# Patient Record
Sex: Female | Born: 1975 | Hispanic: No | Smoking: Former smoker
Health system: Southern US, Community
[De-identification: ages and names within clinical notes are randomized; demographics above are authoritative.]

## PROBLEM LIST (undated history)

## (undated) DIAGNOSIS — E785 Hyperlipidemia, unspecified: Secondary | ICD-10-CM

## (undated) DIAGNOSIS — R0689 Other abnormalities of breathing: Secondary | ICD-10-CM

## (undated) DIAGNOSIS — Z9989 Dependence on other enabling machines and devices: Secondary | ICD-10-CM

## (undated) DIAGNOSIS — R778 Other specified abnormalities of plasma proteins: Secondary | ICD-10-CM

## (undated) DIAGNOSIS — I471 Supraventricular tachycardia, unspecified: Secondary | ICD-10-CM

## (undated) DIAGNOSIS — F32A Depression, unspecified: Secondary | ICD-10-CM

## (undated) DIAGNOSIS — K602 Anal fissure, unspecified: Secondary | ICD-10-CM

## (undated) DIAGNOSIS — G629 Polyneuropathy, unspecified: Secondary | ICD-10-CM

## (undated) DIAGNOSIS — I10 Essential (primary) hypertension: Secondary | ICD-10-CM

## (undated) DIAGNOSIS — J189 Pneumonia, unspecified organism: Secondary | ICD-10-CM

## (undated) DIAGNOSIS — D649 Anemia, unspecified: Secondary | ICD-10-CM

## (undated) DIAGNOSIS — M940 Chondrocostal junction syndrome [Tietze]: Secondary | ICD-10-CM

## (undated) DIAGNOSIS — L02214 Cutaneous abscess of groin: Secondary | ICD-10-CM

## (undated) DIAGNOSIS — L0390 Cellulitis, unspecified: Secondary | ICD-10-CM

## (undated) DIAGNOSIS — R7989 Other specified abnormal findings of blood chemistry: Secondary | ICD-10-CM

## (undated) DIAGNOSIS — G4733 Obstructive sleep apnea (adult) (pediatric): Secondary | ICD-10-CM

## (undated) DIAGNOSIS — M86172 Other acute osteomyelitis, left ankle and foot: Secondary | ICD-10-CM

## (undated) DIAGNOSIS — F3181 Bipolar II disorder: Secondary | ICD-10-CM

## (undated) DIAGNOSIS — J449 Chronic obstructive pulmonary disease, unspecified: Secondary | ICD-10-CM

## (undated) DIAGNOSIS — G8929 Other chronic pain: Secondary | ICD-10-CM

## (undated) DIAGNOSIS — G5601 Carpal tunnel syndrome, right upper limb: Secondary | ICD-10-CM

## (undated) DIAGNOSIS — R51 Headache: Secondary | ICD-10-CM

## (undated) DIAGNOSIS — E1165 Type 2 diabetes mellitus with hyperglycemia: Secondary | ICD-10-CM

## (undated) DIAGNOSIS — R42 Dizziness and giddiness: Secondary | ICD-10-CM

## (undated) DIAGNOSIS — R351 Nocturia: Secondary | ICD-10-CM

## (undated) DIAGNOSIS — I8390 Asymptomatic varicose veins of unspecified lower extremity: Secondary | ICD-10-CM

## (undated) DIAGNOSIS — F329 Major depressive disorder, single episode, unspecified: Secondary | ICD-10-CM

## (undated) DIAGNOSIS — F1410 Cocaine abuse, uncomplicated: Secondary | ICD-10-CM

## (undated) DIAGNOSIS — R55 Syncope and collapse: Secondary | ICD-10-CM

## (undated) DIAGNOSIS — R350 Frequency of micturition: Secondary | ICD-10-CM

## (undated) DIAGNOSIS — M62838 Other muscle spasm: Secondary | ICD-10-CM

## (undated) DIAGNOSIS — G2581 Restless legs syndrome: Secondary | ICD-10-CM

## (undated) DIAGNOSIS — Z765 Malingerer [conscious simulation]: Secondary | ICD-10-CM

## (undated) DIAGNOSIS — K219 Gastro-esophageal reflux disease without esophagitis: Secondary | ICD-10-CM

## (undated) HISTORY — DX: Anemia, unspecified: D64.9

## (undated) HISTORY — DX: Asymptomatic varicose veins of unspecified lower extremity: I83.90

## (undated) HISTORY — PX: FOOT AMPUTATION: SHX951

## (undated) HISTORY — DX: Chronic obstructive pulmonary disease, unspecified: J44.9

## (undated) HISTORY — PX: CARPAL TUNNEL RELEASE: SHX101

## (undated) HISTORY — DX: Morbid (severe) obesity due to excess calories: E66.01

## (undated) HISTORY — PX: REPAIR KNEE LIGAMENT: SUR1188

## (undated) HISTORY — PX: OTHER SURGICAL HISTORY: SHX169

---
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
## 1998-12-06 DIAGNOSIS — E1165 Type 2 diabetes mellitus with hyperglycemia: Secondary | ICD-10-CM

## 1998-12-06 DIAGNOSIS — IMO0002 Reserved for concepts with insufficient information to code with codable children: Secondary | ICD-10-CM

## 1998-12-06 HISTORY — DX: Type 2 diabetes mellitus with hyperglycemia: E11.65

## 1998-12-06 HISTORY — DX: Reserved for concepts with insufficient information to code with codable children: IMO0002

## 1999-10-25 ENCOUNTER — Emergency Department (HOSPITAL_COMMUNITY): Admission: EM | Admit: 1999-10-25 | Discharge: 1999-10-25 | Payer: Self-pay | Admitting: Emergency Medicine

## 2000-02-25 ENCOUNTER — Emergency Department (HOSPITAL_COMMUNITY): Admission: EM | Admit: 2000-02-25 | Discharge: 2000-02-25 | Payer: Self-pay | Admitting: Emergency Medicine

## 2000-05-08 ENCOUNTER — Emergency Department (HOSPITAL_COMMUNITY): Admission: EM | Admit: 2000-05-08 | Discharge: 2000-05-08 | Payer: Self-pay | Admitting: Emergency Medicine

## 2000-05-08 ENCOUNTER — Encounter: Payer: Self-pay | Admitting: Emergency Medicine

## 2000-05-23 ENCOUNTER — Emergency Department (HOSPITAL_COMMUNITY): Admission: EM | Admit: 2000-05-23 | Discharge: 2000-05-23 | Payer: Self-pay | Admitting: Emergency Medicine

## 2000-07-06 ENCOUNTER — Emergency Department (HOSPITAL_COMMUNITY): Admission: EM | Admit: 2000-07-06 | Discharge: 2000-07-06 | Payer: Self-pay | Admitting: Emergency Medicine

## 2000-07-06 ENCOUNTER — Encounter: Payer: Self-pay | Admitting: Emergency Medicine

## 2000-07-10 ENCOUNTER — Encounter: Payer: Self-pay | Admitting: Emergency Medicine

## 2000-07-10 ENCOUNTER — Emergency Department (HOSPITAL_COMMUNITY): Admission: EM | Admit: 2000-07-10 | Discharge: 2000-07-10 | Payer: Self-pay | Admitting: Emergency Medicine

## 2000-09-15 ENCOUNTER — Emergency Department (HOSPITAL_COMMUNITY): Admission: EM | Admit: 2000-09-15 | Discharge: 2000-09-15 | Payer: Self-pay | Admitting: Emergency Medicine

## 2000-11-06 ENCOUNTER — Emergency Department (HOSPITAL_COMMUNITY): Admission: EM | Admit: 2000-11-06 | Discharge: 2000-11-06 | Payer: Self-pay

## 2000-11-10 ENCOUNTER — Emergency Department (HOSPITAL_COMMUNITY): Admission: EM | Admit: 2000-11-10 | Discharge: 2000-11-10 | Payer: Self-pay | Admitting: Emergency Medicine

## 2000-11-13 ENCOUNTER — Emergency Department (HOSPITAL_COMMUNITY): Admission: EM | Admit: 2000-11-13 | Discharge: 2000-11-14 | Payer: Self-pay | Admitting: Emergency Medicine

## 2000-11-14 ENCOUNTER — Encounter: Payer: Self-pay | Admitting: Emergency Medicine

## 2000-12-03 ENCOUNTER — Emergency Department (HOSPITAL_COMMUNITY): Admission: EM | Admit: 2000-12-03 | Discharge: 2000-12-04 | Payer: Self-pay

## 2000-12-08 ENCOUNTER — Encounter: Payer: Self-pay | Admitting: Emergency Medicine

## 2000-12-08 ENCOUNTER — Emergency Department (HOSPITAL_COMMUNITY): Admission: EM | Admit: 2000-12-08 | Discharge: 2000-12-08 | Payer: Self-pay | Admitting: Emergency Medicine

## 2000-12-29 ENCOUNTER — Emergency Department (HOSPITAL_COMMUNITY): Admission: EM | Admit: 2000-12-29 | Discharge: 2000-12-29 | Payer: Self-pay | Admitting: Emergency Medicine

## 2001-01-20 ENCOUNTER — Emergency Department (HOSPITAL_COMMUNITY): Admission: EM | Admit: 2001-01-20 | Discharge: 2001-01-20 | Payer: Self-pay | Admitting: Emergency Medicine

## 2001-02-03 ENCOUNTER — Encounter: Payer: Self-pay | Admitting: *Deleted

## 2001-02-03 ENCOUNTER — Inpatient Hospital Stay (HOSPITAL_COMMUNITY): Admission: EM | Admit: 2001-02-03 | Discharge: 2001-02-06 | Payer: Self-pay | Admitting: *Deleted

## 2001-03-01 ENCOUNTER — Encounter: Admission: RE | Admit: 2001-03-01 | Discharge: 2001-03-30 | Payer: Self-pay | Admitting: Orthopedic Surgery

## 2001-05-07 ENCOUNTER — Emergency Department (HOSPITAL_COMMUNITY): Admission: EM | Admit: 2001-05-07 | Discharge: 2001-05-07 | Payer: Self-pay | Admitting: Emergency Medicine

## 2001-05-10 ENCOUNTER — Emergency Department (HOSPITAL_COMMUNITY): Admission: EM | Admit: 2001-05-10 | Discharge: 2001-05-10 | Payer: Self-pay | Admitting: Emergency Medicine

## 2001-05-10 ENCOUNTER — Encounter: Payer: Self-pay | Admitting: Emergency Medicine

## 2001-06-10 ENCOUNTER — Emergency Department (HOSPITAL_COMMUNITY): Admission: EM | Admit: 2001-06-10 | Discharge: 2001-06-10 | Payer: Self-pay | Admitting: Emergency Medicine

## 2001-06-10 ENCOUNTER — Encounter: Payer: Self-pay | Admitting: Emergency Medicine

## 2001-07-02 ENCOUNTER — Emergency Department (HOSPITAL_COMMUNITY): Admission: EM | Admit: 2001-07-02 | Discharge: 2001-07-03 | Payer: Self-pay | Admitting: Emergency Medicine

## 2001-07-02 ENCOUNTER — Encounter: Payer: Self-pay | Admitting: Emergency Medicine

## 2001-09-05 ENCOUNTER — Emergency Department (HOSPITAL_COMMUNITY): Admission: EM | Admit: 2001-09-05 | Discharge: 2001-09-05 | Payer: Self-pay | Admitting: Emergency Medicine

## 2001-09-05 ENCOUNTER — Encounter: Payer: Self-pay | Admitting: Emergency Medicine

## 2001-10-11 ENCOUNTER — Emergency Department (HOSPITAL_COMMUNITY): Admission: EM | Admit: 2001-10-11 | Discharge: 2001-10-11 | Payer: Self-pay | Admitting: Emergency Medicine

## 2001-10-19 ENCOUNTER — Encounter: Payer: Self-pay | Admitting: Orthopedic Surgery

## 2001-10-19 ENCOUNTER — Ambulatory Visit (HOSPITAL_COMMUNITY): Admission: RE | Admit: 2001-10-19 | Discharge: 2001-10-19 | Payer: Self-pay | Admitting: Orthopedic Surgery

## 2001-11-05 ENCOUNTER — Emergency Department (HOSPITAL_COMMUNITY): Admission: EM | Admit: 2001-11-05 | Discharge: 2001-11-05 | Payer: Self-pay | Admitting: Emergency Medicine

## 2001-11-07 ENCOUNTER — Encounter: Admission: RE | Admit: 2001-11-07 | Discharge: 2001-11-21 | Payer: Self-pay | Admitting: Orthopedic Surgery

## 2001-12-02 ENCOUNTER — Emergency Department (HOSPITAL_COMMUNITY): Admission: EM | Admit: 2001-12-02 | Discharge: 2001-12-02 | Payer: Self-pay | Admitting: Emergency Medicine

## 2001-12-07 ENCOUNTER — Emergency Department (HOSPITAL_COMMUNITY): Admission: EM | Admit: 2001-12-07 | Discharge: 2001-12-07 | Payer: Self-pay | Admitting: Emergency Medicine

## 2001-12-07 ENCOUNTER — Encounter: Payer: Self-pay | Admitting: Emergency Medicine

## 2002-01-16 ENCOUNTER — Encounter: Payer: Self-pay | Admitting: Emergency Medicine

## 2002-01-16 ENCOUNTER — Emergency Department (HOSPITAL_COMMUNITY): Admission: EM | Admit: 2002-01-16 | Discharge: 2002-01-16 | Payer: Self-pay | Admitting: Emergency Medicine

## 2002-04-06 ENCOUNTER — Emergency Department (HOSPITAL_COMMUNITY): Admission: EM | Admit: 2002-04-06 | Discharge: 2002-04-06 | Payer: Self-pay | Admitting: Emergency Medicine

## 2002-04-06 ENCOUNTER — Encounter: Payer: Self-pay | Admitting: Emergency Medicine

## 2002-04-22 ENCOUNTER — Emergency Department (HOSPITAL_COMMUNITY): Admission: EM | Admit: 2002-04-22 | Discharge: 2002-04-22 | Payer: Self-pay

## 2002-05-07 ENCOUNTER — Encounter: Payer: Self-pay | Admitting: Geriatric Medicine

## 2002-05-07 ENCOUNTER — Inpatient Hospital Stay (HOSPITAL_COMMUNITY): Admission: EM | Admit: 2002-05-07 | Discharge: 2002-05-08 | Payer: Self-pay | Admitting: Emergency Medicine

## 2002-05-18 ENCOUNTER — Emergency Department (HOSPITAL_COMMUNITY): Admission: EM | Admit: 2002-05-18 | Discharge: 2002-05-19 | Payer: Self-pay | Admitting: Emergency Medicine

## 2002-05-19 ENCOUNTER — Encounter: Payer: Self-pay | Admitting: Emergency Medicine

## 2002-05-23 ENCOUNTER — Encounter: Payer: Self-pay | Admitting: Emergency Medicine

## 2002-05-23 ENCOUNTER — Emergency Department (HOSPITAL_COMMUNITY): Admission: EM | Admit: 2002-05-23 | Discharge: 2002-05-23 | Payer: Self-pay | Admitting: Emergency Medicine

## 2002-06-20 ENCOUNTER — Encounter: Payer: Self-pay | Admitting: Emergency Medicine

## 2002-06-20 ENCOUNTER — Emergency Department (HOSPITAL_COMMUNITY): Admission: EM | Admit: 2002-06-20 | Discharge: 2002-06-20 | Payer: Self-pay | Admitting: *Deleted

## 2003-02-18 ENCOUNTER — Encounter: Payer: Self-pay | Admitting: Emergency Medicine

## 2003-02-18 ENCOUNTER — Inpatient Hospital Stay (HOSPITAL_COMMUNITY): Admission: EM | Admit: 2003-02-18 | Discharge: 2003-02-22 | Payer: Self-pay | Admitting: Emergency Medicine

## 2003-02-20 ENCOUNTER — Encounter: Payer: Self-pay | Admitting: Cardiovascular Disease

## 2003-05-25 ENCOUNTER — Emergency Department (HOSPITAL_COMMUNITY): Admission: EM | Admit: 2003-05-25 | Discharge: 2003-05-25 | Payer: Self-pay

## 2003-07-14 ENCOUNTER — Encounter: Payer: Self-pay | Admitting: Emergency Medicine

## 2003-07-14 ENCOUNTER — Emergency Department (HOSPITAL_COMMUNITY): Admission: AD | Admit: 2003-07-14 | Discharge: 2003-07-15 | Payer: Self-pay | Admitting: Emergency Medicine

## 2003-07-21 ENCOUNTER — Emergency Department (HOSPITAL_COMMUNITY): Admission: EM | Admit: 2003-07-21 | Discharge: 2003-07-21 | Payer: Self-pay | Admitting: Emergency Medicine

## 2003-07-21 ENCOUNTER — Encounter: Payer: Self-pay | Admitting: Emergency Medicine

## 2003-12-26 ENCOUNTER — Emergency Department (HOSPITAL_COMMUNITY): Admission: EM | Admit: 2003-12-26 | Discharge: 2003-12-26 | Payer: Self-pay | Admitting: Emergency Medicine

## 2004-01-09 ENCOUNTER — Emergency Department (HOSPITAL_COMMUNITY): Admission: EM | Admit: 2004-01-09 | Discharge: 2004-01-09 | Payer: Self-pay | Admitting: Emergency Medicine

## 2004-01-16 ENCOUNTER — Emergency Department (HOSPITAL_COMMUNITY): Admission: EM | Admit: 2004-01-16 | Discharge: 2004-01-17 | Payer: Self-pay | Admitting: Emergency Medicine

## 2004-01-27 ENCOUNTER — Emergency Department (HOSPITAL_COMMUNITY): Admission: EM | Admit: 2004-01-27 | Discharge: 2004-01-27 | Payer: Self-pay | Admitting: Emergency Medicine

## 2004-01-28 ENCOUNTER — Encounter: Admission: RE | Admit: 2004-01-28 | Discharge: 2004-04-27 | Payer: Self-pay | Admitting: Internal Medicine

## 2004-01-31 ENCOUNTER — Ambulatory Visit (HOSPITAL_BASED_OUTPATIENT_CLINIC_OR_DEPARTMENT_OTHER): Admission: RE | Admit: 2004-01-31 | Discharge: 2004-01-31 | Payer: Self-pay | Admitting: Critical Care Medicine

## 2004-02-07 ENCOUNTER — Encounter: Admission: RE | Admit: 2004-02-07 | Discharge: 2004-02-07 | Payer: Self-pay | Admitting: Family Medicine

## 2004-02-27 ENCOUNTER — Encounter: Admission: RE | Admit: 2004-02-27 | Discharge: 2004-02-27 | Payer: Self-pay | Admitting: Family Medicine

## 2004-03-10 ENCOUNTER — Encounter: Admission: RE | Admit: 2004-03-10 | Discharge: 2004-03-10 | Payer: Self-pay | Admitting: Obstetrics and Gynecology

## 2004-03-13 ENCOUNTER — Emergency Department (HOSPITAL_COMMUNITY): Admission: EM | Admit: 2004-03-13 | Discharge: 2004-03-13 | Payer: Self-pay | Admitting: Emergency Medicine

## 2004-03-17 ENCOUNTER — Emergency Department (HOSPITAL_COMMUNITY): Admission: EM | Admit: 2004-03-17 | Discharge: 2004-03-17 | Payer: Self-pay | Admitting: Emergency Medicine

## 2004-03-26 ENCOUNTER — Ambulatory Visit (HOSPITAL_COMMUNITY): Admission: RE | Admit: 2004-03-26 | Discharge: 2004-03-26 | Payer: Self-pay | Admitting: Nurse Practitioner

## 2004-05-04 ENCOUNTER — Emergency Department (HOSPITAL_COMMUNITY): Admission: EM | Admit: 2004-05-04 | Discharge: 2004-05-04 | Payer: Self-pay | Admitting: Emergency Medicine

## 2004-05-04 ENCOUNTER — Ambulatory Visit (HOSPITAL_COMMUNITY): Admission: RE | Admit: 2004-05-04 | Discharge: 2004-05-04 | Payer: Self-pay | Admitting: Emergency Medicine

## 2004-05-05 ENCOUNTER — Ambulatory Visit (HOSPITAL_COMMUNITY): Admission: RE | Admit: 2004-05-05 | Discharge: 2004-05-05 | Payer: Self-pay | Admitting: Emergency Medicine

## 2004-05-06 ENCOUNTER — Ambulatory Visit (HOSPITAL_BASED_OUTPATIENT_CLINIC_OR_DEPARTMENT_OTHER): Admission: RE | Admit: 2004-05-06 | Discharge: 2004-05-06 | Payer: Self-pay | Admitting: *Deleted

## 2004-05-16 ENCOUNTER — Emergency Department (HOSPITAL_COMMUNITY): Admission: EM | Admit: 2004-05-16 | Discharge: 2004-05-17 | Payer: Self-pay | Admitting: Emergency Medicine

## 2004-05-21 ENCOUNTER — Emergency Department (HOSPITAL_COMMUNITY): Admission: EM | Admit: 2004-05-21 | Discharge: 2004-05-21 | Payer: Self-pay | Admitting: Family Medicine

## 2004-05-30 ENCOUNTER — Emergency Department (HOSPITAL_COMMUNITY): Admission: EM | Admit: 2004-05-30 | Discharge: 2004-05-30 | Payer: Self-pay | Admitting: Emergency Medicine

## 2004-07-20 ENCOUNTER — Emergency Department (HOSPITAL_COMMUNITY): Admission: EM | Admit: 2004-07-20 | Discharge: 2004-07-20 | Payer: Self-pay | Admitting: Family Medicine

## 2004-09-04 ENCOUNTER — Encounter: Admission: RE | Admit: 2004-09-04 | Discharge: 2004-09-04 | Payer: Self-pay | Admitting: Orthopaedic Surgery

## 2004-09-07 ENCOUNTER — Emergency Department (HOSPITAL_COMMUNITY): Admission: EM | Admit: 2004-09-07 | Discharge: 2004-09-07 | Payer: Self-pay | Admitting: Emergency Medicine

## 2004-09-22 ENCOUNTER — Encounter: Admission: RE | Admit: 2004-09-22 | Discharge: 2004-10-28 | Payer: Self-pay | Admitting: Orthopaedic Surgery

## 2004-09-29 ENCOUNTER — Emergency Department (HOSPITAL_COMMUNITY): Admission: EM | Admit: 2004-09-29 | Discharge: 2004-09-29 | Payer: Self-pay | Admitting: Emergency Medicine

## 2004-11-22 ENCOUNTER — Emergency Department (HOSPITAL_COMMUNITY): Admission: EM | Admit: 2004-11-22 | Discharge: 2004-11-23 | Payer: Self-pay | Admitting: Emergency Medicine

## 2004-12-20 ENCOUNTER — Emergency Department (HOSPITAL_COMMUNITY): Admission: EM | Admit: 2004-12-20 | Discharge: 2004-12-20 | Payer: Self-pay | Admitting: Emergency Medicine

## 2004-12-22 ENCOUNTER — Inpatient Hospital Stay (HOSPITAL_COMMUNITY): Admission: EM | Admit: 2004-12-22 | Discharge: 2004-12-24 | Payer: Self-pay | Admitting: Psychiatry

## 2004-12-22 ENCOUNTER — Encounter: Payer: Self-pay | Admitting: Emergency Medicine

## 2004-12-22 ENCOUNTER — Ambulatory Visit: Payer: Self-pay | Admitting: Psychiatry

## 2005-02-08 ENCOUNTER — Emergency Department (HOSPITAL_COMMUNITY): Admission: EM | Admit: 2005-02-08 | Discharge: 2005-02-08 | Payer: Self-pay | Admitting: Emergency Medicine

## 2005-02-17 IMAGING — CR DG FOOT COMPLETE 3+V*L*
3 series · 3 of 3 positions shown · non-contrast
Comparison: none

CLINICAL DATA: 27 year-old female.   Fall, pain, previous history of fifth metatarsal base fracture.   Current pain is along the fifth metatarsal head.
 LEFT FOOT, THREE VIEWS

[view not recorded (1 of 3)]
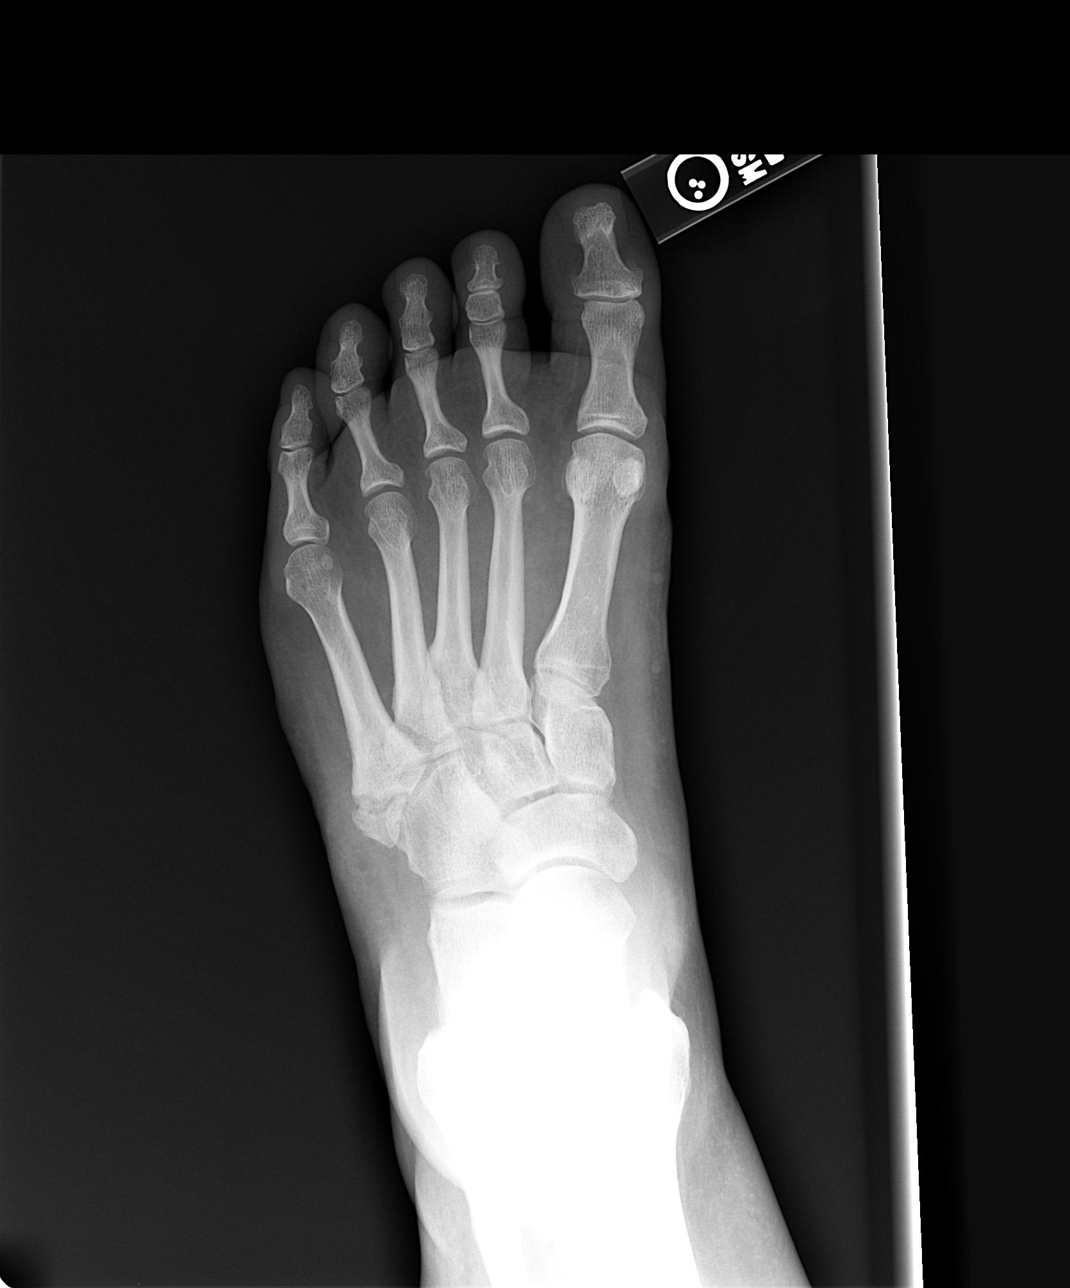

[view not recorded (2 of 3)]
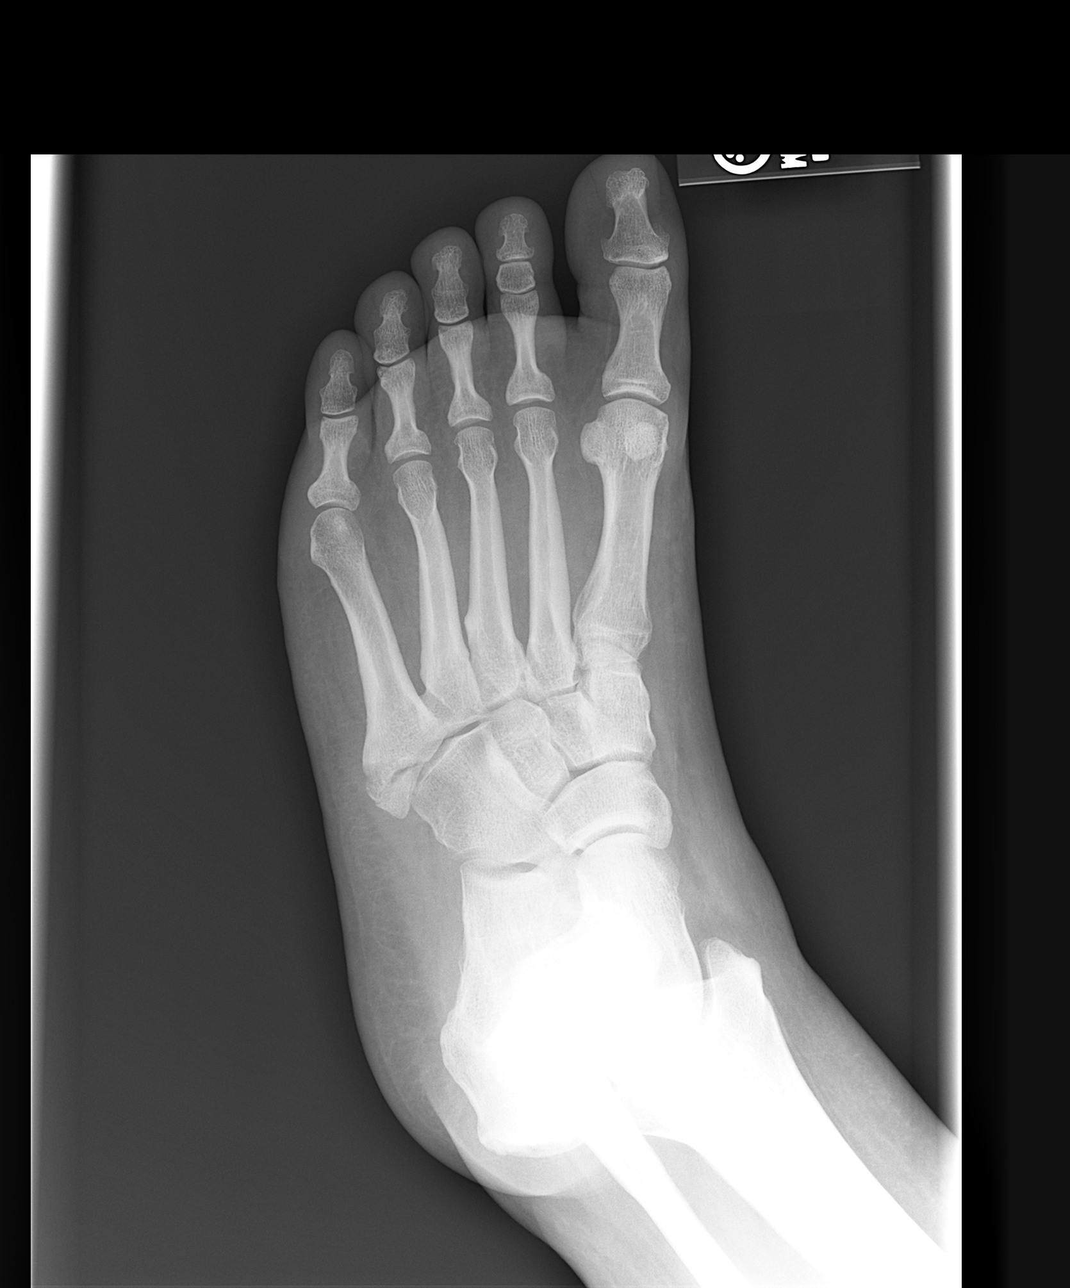

[view not recorded (3 of 3)]
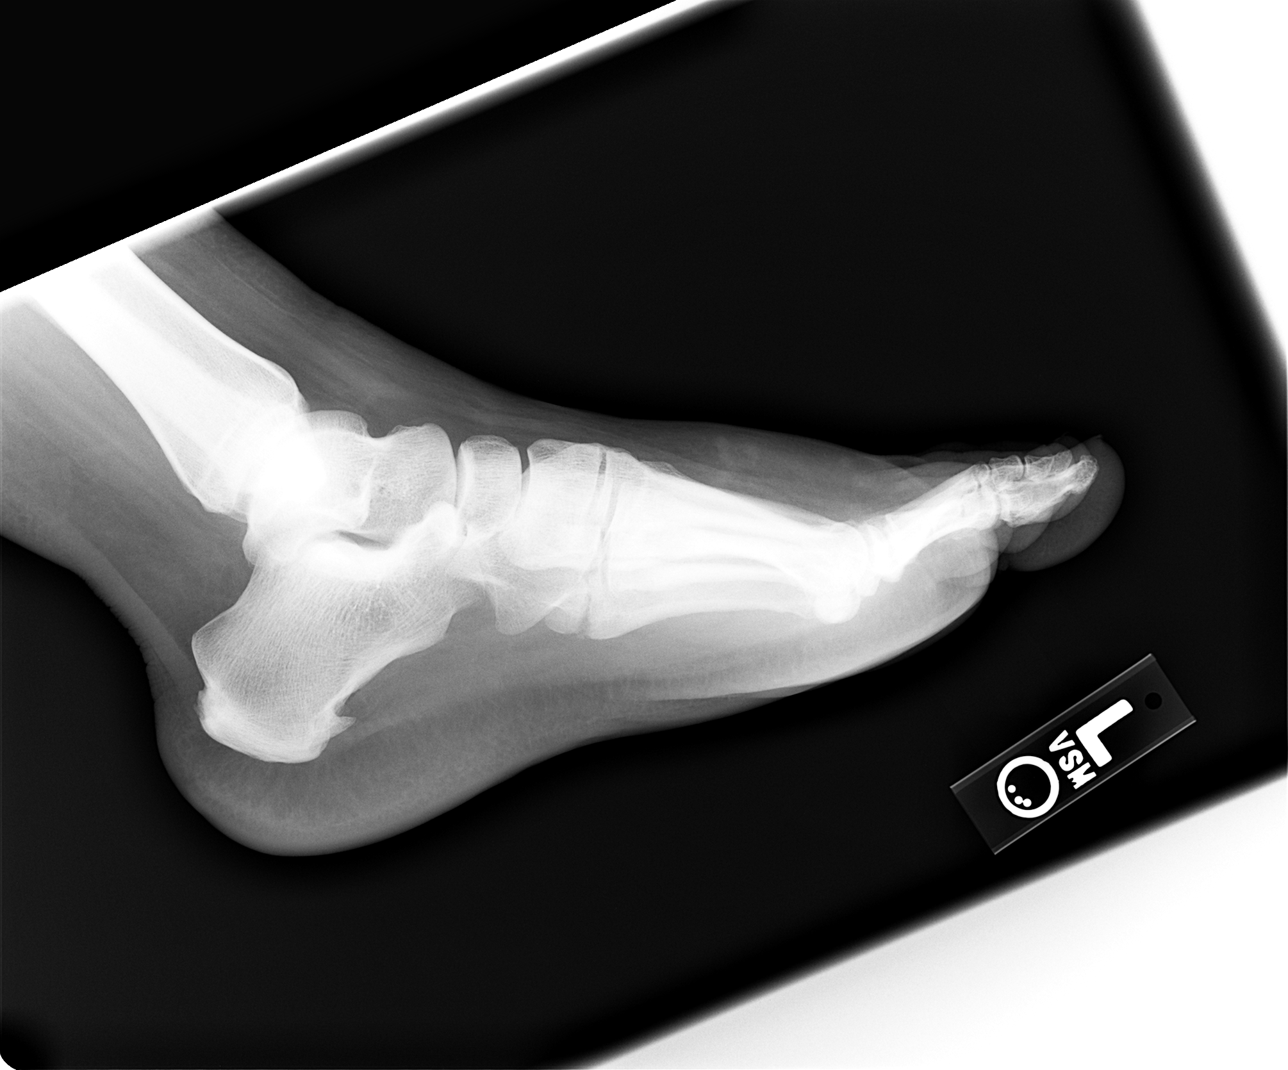

[3 of 3 positions shown; findings below may reference images not displayed]

FINDINGS: There is bony irregularity with evidence of sclerosis about the left fifth metatarsal base consistent with a partially healed nonunion fracture.   This does not appear to be acute.   Specifically, the left fifth metatarsal head is intact.  Plantar calcaneal spurring is evident.  
 IMPRESSION 
 Partially healed but ununited remote fracture of the left fifth metatarsal base.  No definite acute fracture.

## 2005-02-23 ENCOUNTER — Emergency Department (HOSPITAL_COMMUNITY): Admission: EM | Admit: 2005-02-23 | Discharge: 2005-02-23 | Payer: Self-pay | Admitting: Emergency Medicine

## 2005-02-24 IMAGING — CT CT EXTREM LOW BILAT W/ CM
2 of 5 series · 7 of 14 positions shown, 8 images · IV contrast (omnipaque)
Comparison: Report from prior chest CT from 05/07/02 and prior two view chest radiograph from 12/26/03.

CLINICAL DATA: Chest pain.
 CT OF THE CHEST WITH CONTRAST ? 01/16/04
TECHNIQUE: Contiguous axial CT images were obtained through the lungs following intravenous administration of 200 cc Omnipaque 300 IV contrast.
TECHNIQUE: In the delayed venous phase following contrast administration, noncontiguous axial CT images were obtained from the knees to the iliac crest.

[Series 2: pe · axial · 0.64mm/px · z∈[-246,-131]mm · 2 of 138 slices shown, 3 images]
[im 46/138  soft-tissue]
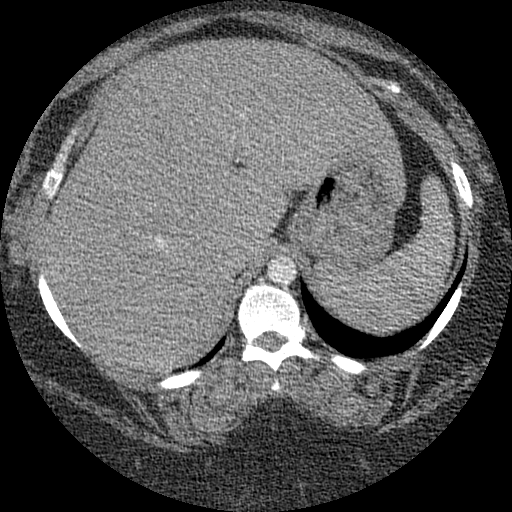
[im 46/138  bone]
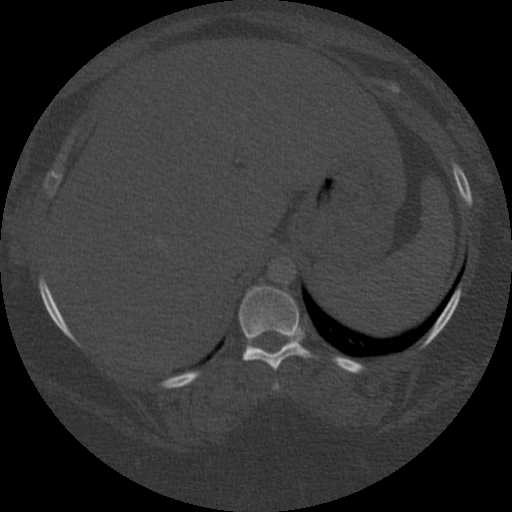
[im 92/138  bone]
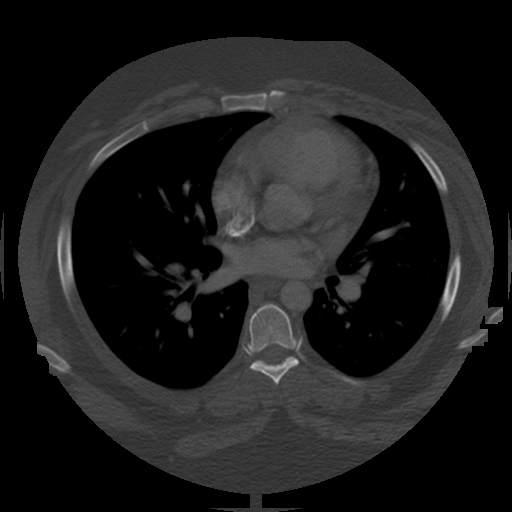

[Series 3: recon 2: pe · axial · 0.64mm/px · z∈[-221,-58]mm · 5 of 197 slices shown]
[im 33/197  bone]
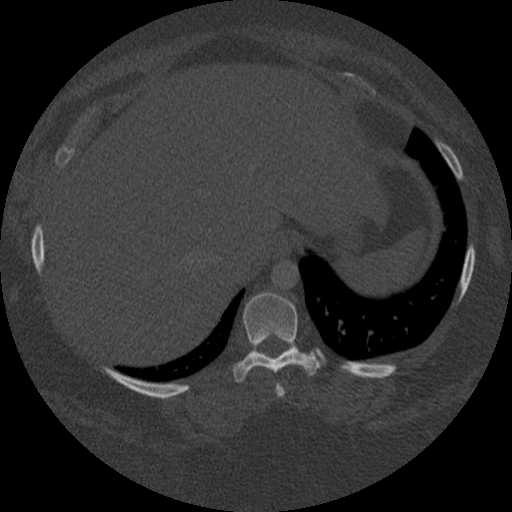
[im 66/197  bone]
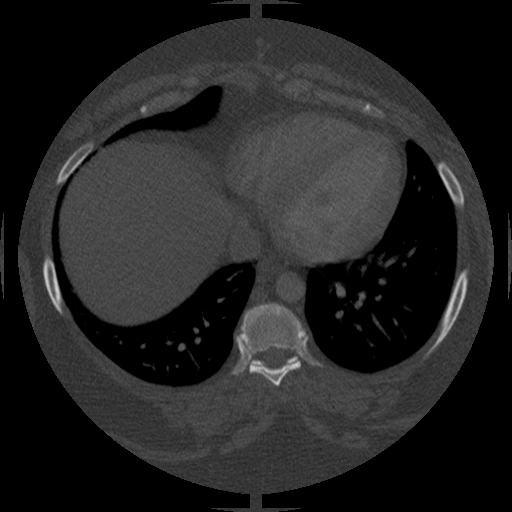
[im 99/197  bone]
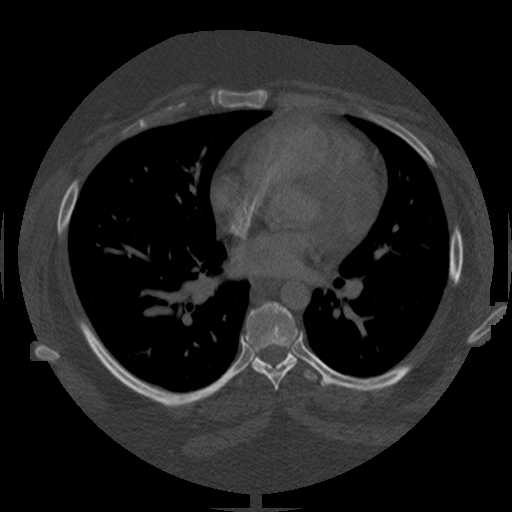
[im 131/197  bone]
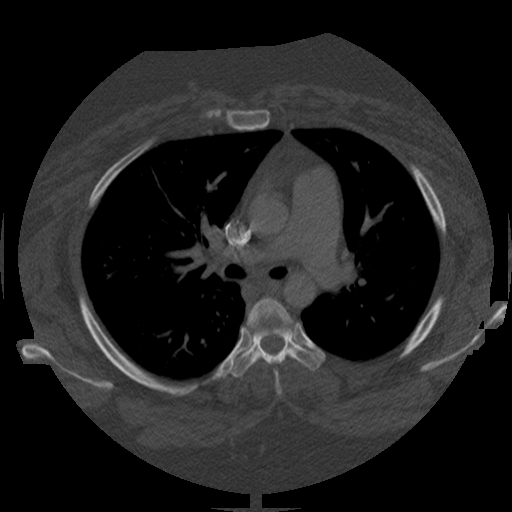
[im 164/197  bone]
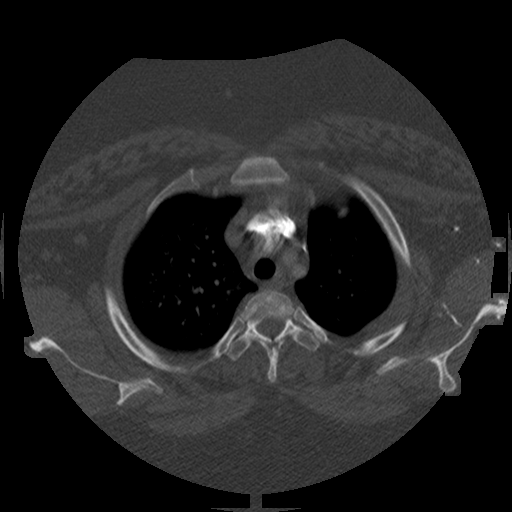

[7 of 14 positions shown; findings below may reference images not displayed]

FINDINGS: Exam is mildly limited due to the patient?s body habitus.  
 No filling defect is identified in the pulmonary arterial tree to suggest pulmonary embolus.  Thoracic aorta and branch vessels appear unremarkable.  There is no evidence of hilar adenopathy.  A prevascular lymph node measures 10.0 mm in short axis dimension on image 74, which is borderline enlarged.
IMPRESSION: Borderline size of single prevascular lymph node.  Otherwise normal CT appearance of the chest.
 LOWER EXTREMITY CT WITH CONTRAST, LIMITED IN DELAYED VENOUS PHASE FOR EVALUATION OF LOWER EXTREMITY AND PELVIC DVT
FINDINGS: No filling defect is identified in the lower extremity or pelvic deep venous system to suggest DVT.
IMPRESSION: No deep venous thrombosis is demonstrated.

## 2005-03-04 ENCOUNTER — Emergency Department (HOSPITAL_COMMUNITY): Admission: EM | Admit: 2005-03-04 | Discharge: 2005-03-04 | Payer: Self-pay | Admitting: Emergency Medicine

## 2005-03-10 ENCOUNTER — Emergency Department (HOSPITAL_COMMUNITY): Admission: EM | Admit: 2005-03-10 | Discharge: 2005-03-11 | Payer: Self-pay | Admitting: Emergency Medicine

## 2005-04-06 ENCOUNTER — Ambulatory Visit: Payer: Self-pay | Admitting: Critical Care Medicine

## 2005-04-16 ENCOUNTER — Emergency Department (HOSPITAL_COMMUNITY): Admission: EM | Admit: 2005-04-16 | Discharge: 2005-04-16 | Payer: Self-pay | Admitting: Emergency Medicine

## 2005-04-22 IMAGING — CT CT EXTREM LOW BILAT W/ CM
2 of 6 series · 5 of 14 positions shown, 6 images · IV contrast (200 CC OMNI 300)
Comparison: none

CLINICAL DATA: Chest pain.
 CT CHEST WITH CONTRAST
 Multidetector helical CT imaging chest performed using pulmonary embolism protocol following 150 cc Omnipaque 300.

[Series 5: recon 3: don't forget off/on re · axial · 0.59mm/px · z∈[-193,-127]mm · 2 of 159 slices shown]
[im 53/159  bone]
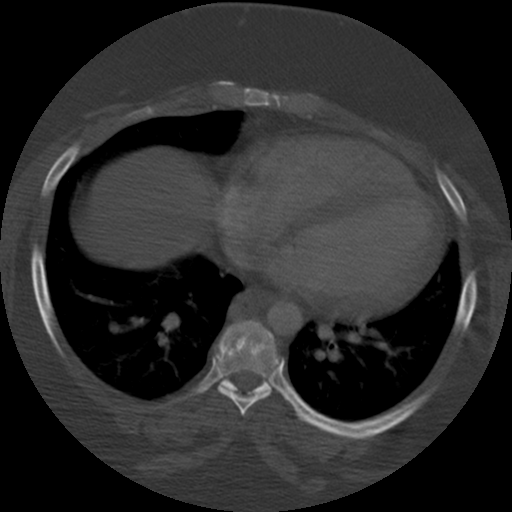
[im 106/159  bone]
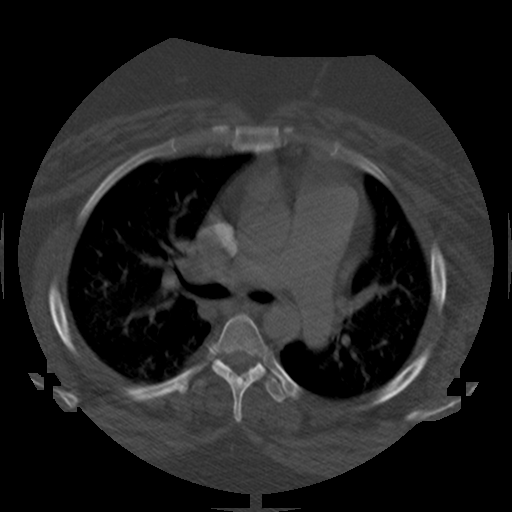

[Series 103: don't forget off/on recon (date) · axial · 0.59mm/px · z∈[-209,-111]mm · 3 of 198 slices shown, 4 images]
[im 50/198  soft-tissue]
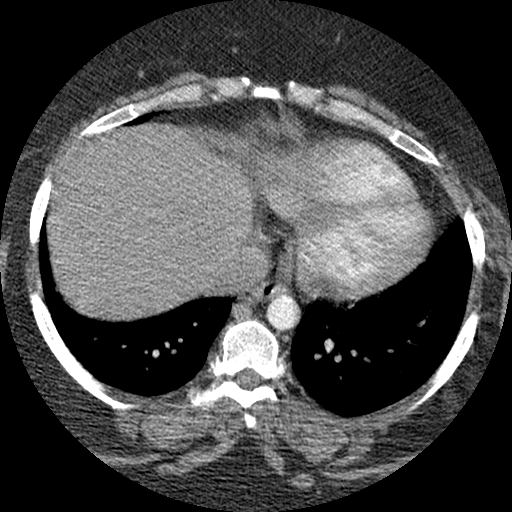
[im 50/198  bone]
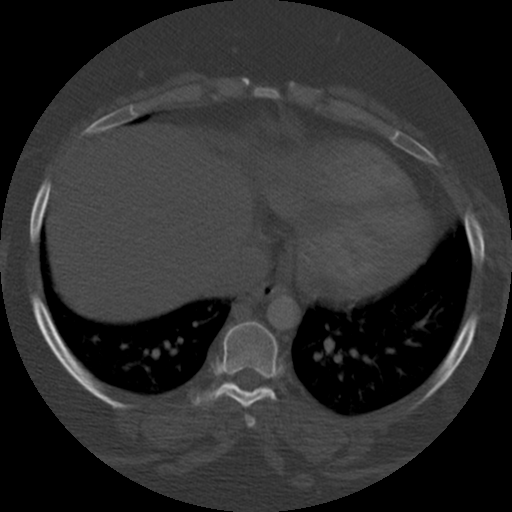
[im 99/198  bone]
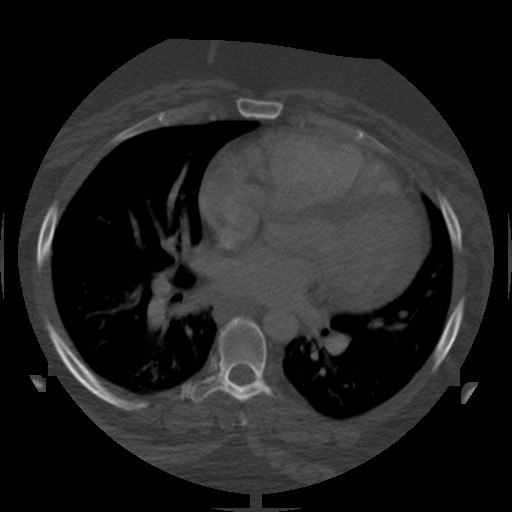
[im 148/198  bone]
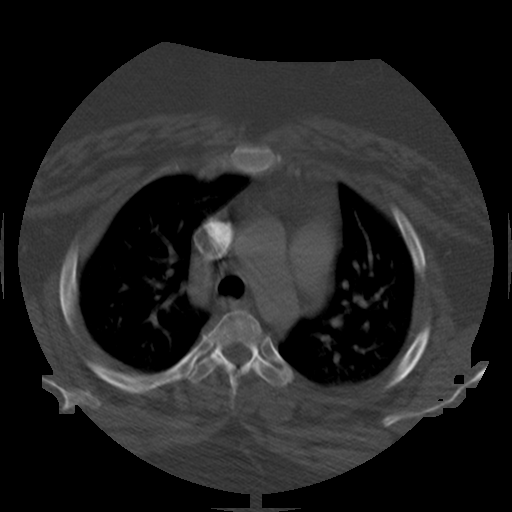

[5 of 14 positions shown; findings below may reference images not displayed]

FINDINGS: Exam technically limited by patient?s size, with subsequent beam hardening.  Comparison 01/16/04.
 No gross evidence of pulmonary embolic disease.  Scattered respiratory motion artifacts.  Question minimal ground glass infiltrate in the upper lobes.  No pleural effusion or segmental consolidation.  No pneumothorax.  Aorta normal caliber.  No thoracic adenopathy.  Bones unremarkable.
 IMPRESSION 
 Technically limited exam due to patient?s size, with no gross evidence of pulmonary emboli.
 CT LOWER EXTREMITIES BILATERAL LIMITED WITH CONTRAST
 Axial CT images of the lower extremities performed at delayed interval following CT chest.  
 No evidence of deep venous thrombosis or other significant abnormality.  
 IMPRESSION 
 No evidence of deep venous thrombosis.

## 2005-04-26 IMAGING — CT CT ABDOMEN W/ CM
1 of 2 series · 14 of 32 positions shown, 19 images · IV contrast (GASTRO & OMNI 150 ML)
Comparison: none

CLINICAL DATA: Lower abdominal pain.  
CT SCAN OF THE ABDOMEN WITH AND WITHOUT CONTRAST
150 cc of Omnipaque 300 was utilized.  Presently no comparison abdominal scans available.  
CT ABDOMEN 
Lung bases clear.  The heart is enlarged.  The present exam is limited because of patient?s habitus.  Taking this limitation into account, the liver, spleen, pancreas, kidneys, and left adrenal gland appear unremarkable.  Within the right adrenal gland there is a 1.8 x 1.9 cm lesion which does not have Hounsfield units allowing this to be diagnosed as an adenoma although it may represent such.  Further delineation with MR (in phase and out of phase imaging) recommended.  No evidence of abnormal inflammatory process or fluid collection within the upper abdomen. 
IMPRESSION
Right adrenal gland 1.8 x 1.9 cm structure.  Although this may represent an adenoma, this cannot be concluded on the present exam.  In phase and out of phase MR imaging may be considered.  
No evidence of abnormal inflammatory process fluid collection within the upper abdomen.  By CT the gallbladder appears unremarkable however, if gallbladder abnormality were of high clinical concern ultrasound may then be considered. 
CT PELVIS 
Limited evaluation because of the patient?s habitus.  The appendix is visualized along its proximal mid aspect and filled with contrast without surrounding inflammation.  The distal aspect does not completely fill with contrast although no definite surrounding inflammatory process.  Of note is small to slight amount of free fluid within the pelvis.  Etiology indeterminate.  Discrete findings of diverticulitis not identified on the present exam.  If there are any progressive symptoms, close followup imaging recommended.  If primary ovarian process such as ruptured ovarian cyst is felt to be the cause for the patient?s discomfort, this can be further evaluated with pelvic ultrasound.  Degenerative changes lower lumbar spine. 
Small amount of free fluid in the cul-de-sac.  Etiology indeterminate.  Discrete findings of appendicitis not seen on the present exam with followup as noted above.

[Series 2: abd pelvis · axial · 0.82mm/px · z∈[-471,-46]mm · 14 of 142 slices shown, 19 images]
[im 7/142  soft-tissue]
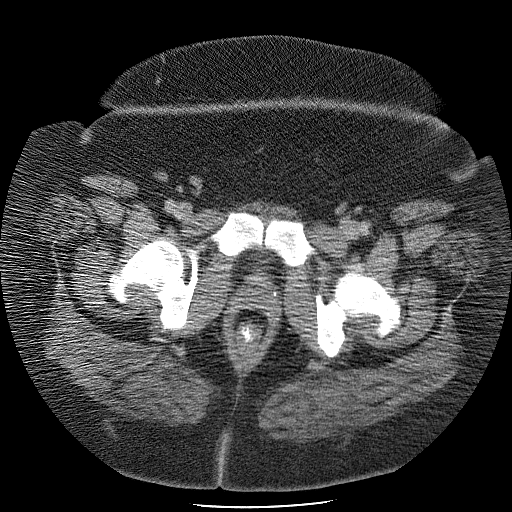
[im 7/142  bone]
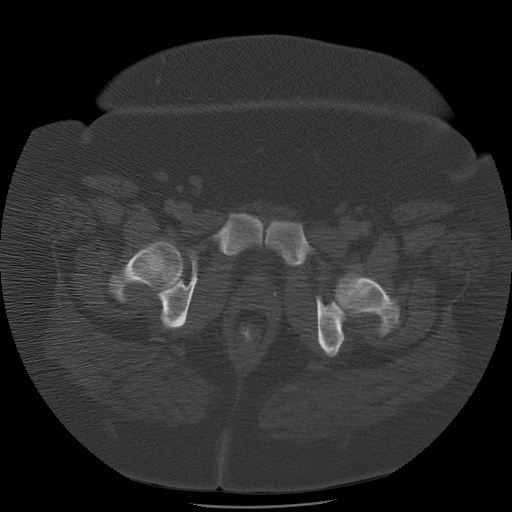
[im 21/142  soft-tissue]
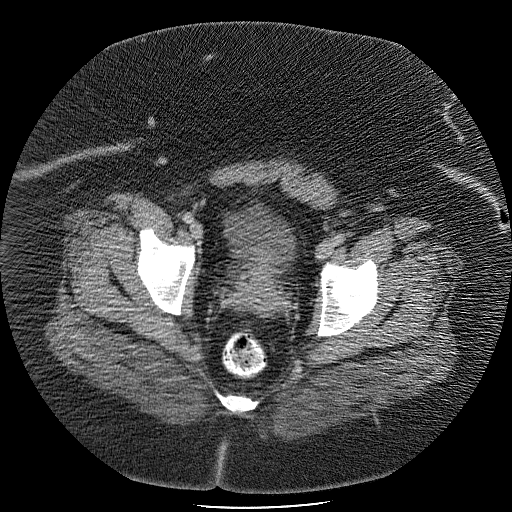
[im 27/142  soft-tissue]
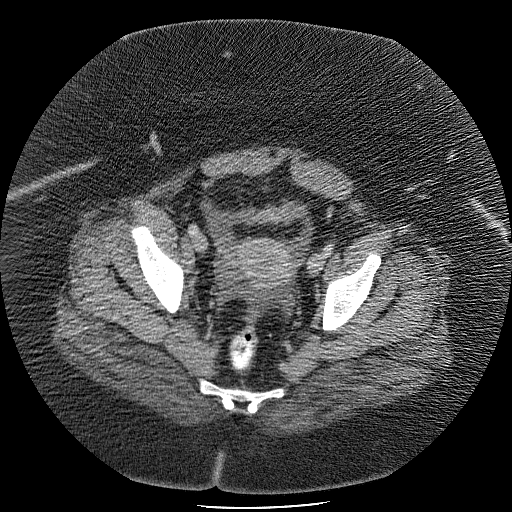
[im 41/142  soft-tissue]
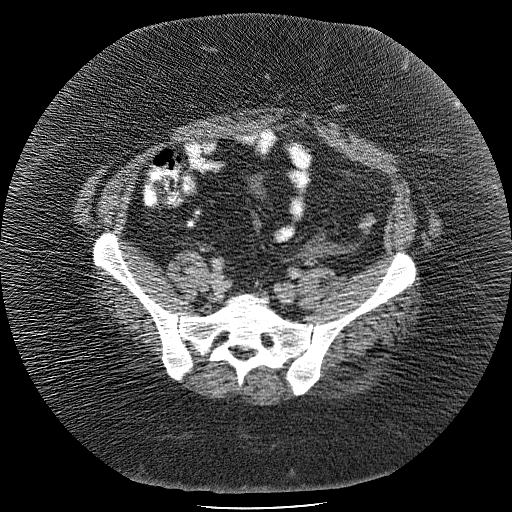
[im 48/142  soft-tissue]
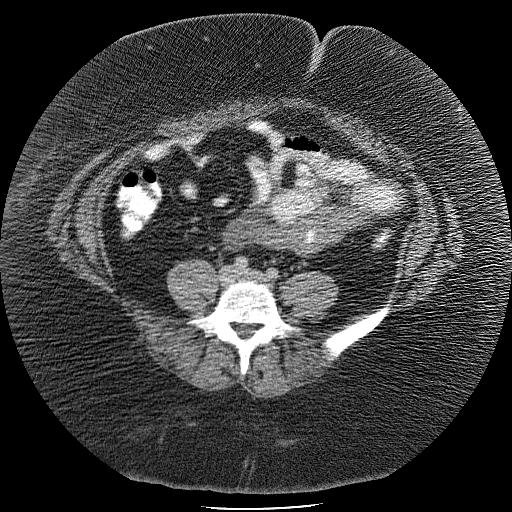
[im 61/142  soft-tissue]
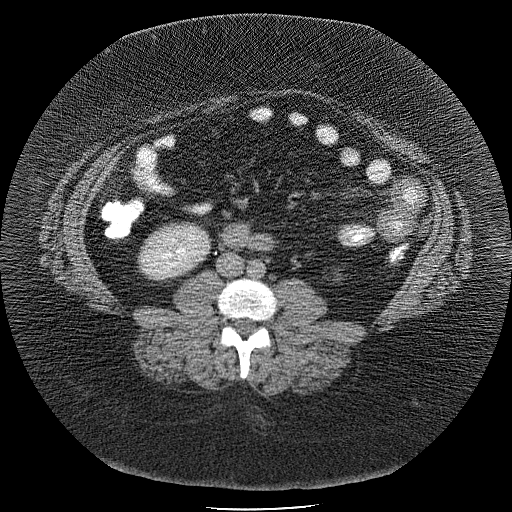
[im 74/142  soft-tissue]
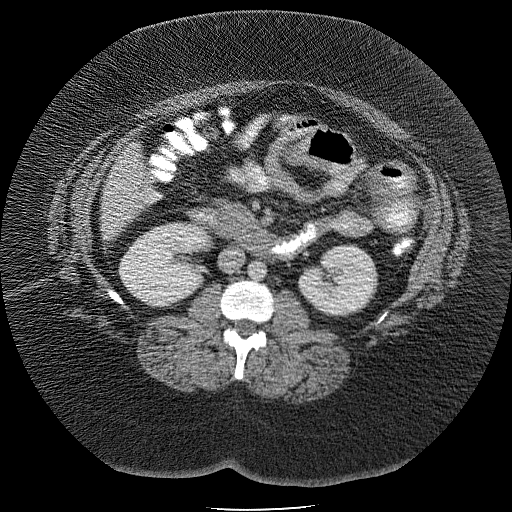
[im 81/142  soft-tissue]
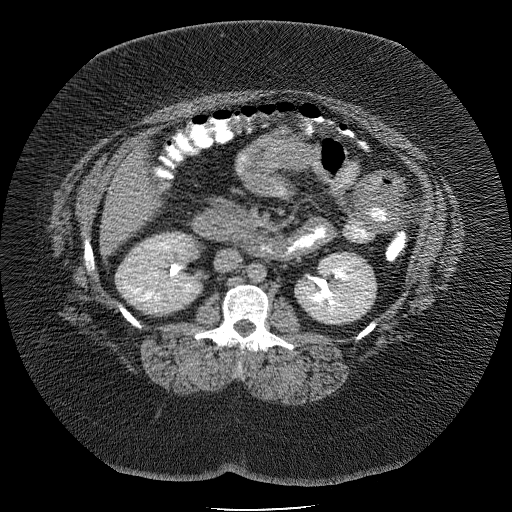
[im 95/142  soft-tissue]
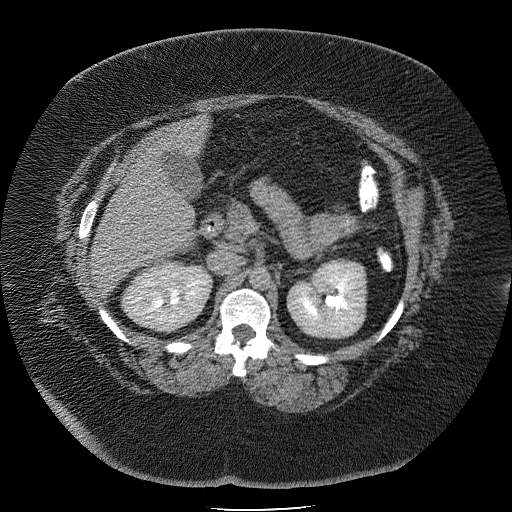
[im 95/142  bone]
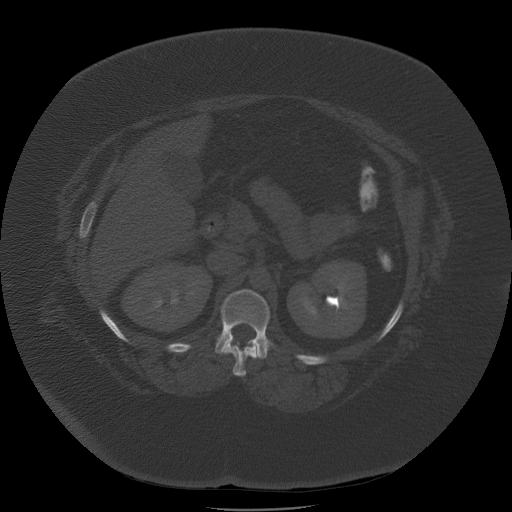
[im 101/142  soft-tissue]
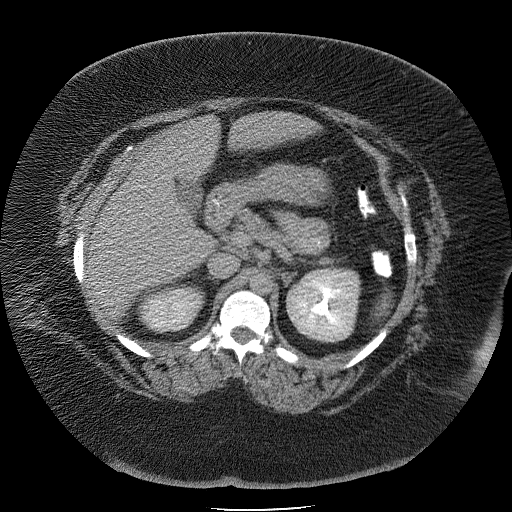
[im 115/142  soft-tissue]
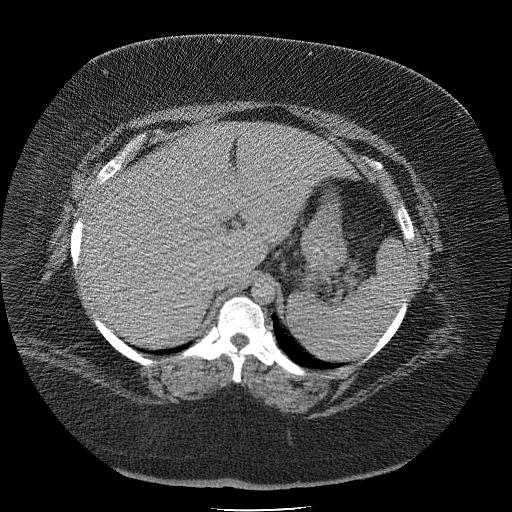
[im 115/142  lung]
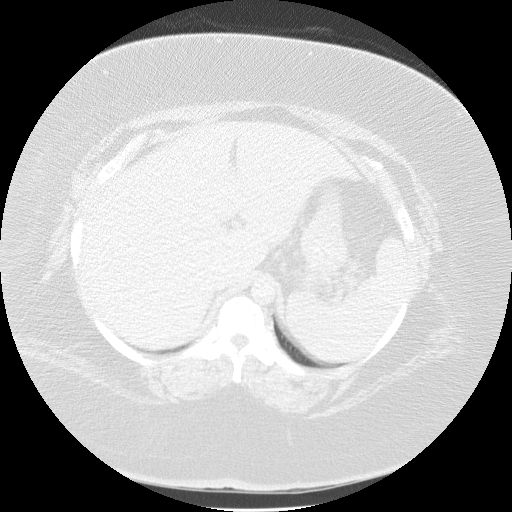
[im 121/142  soft-tissue]
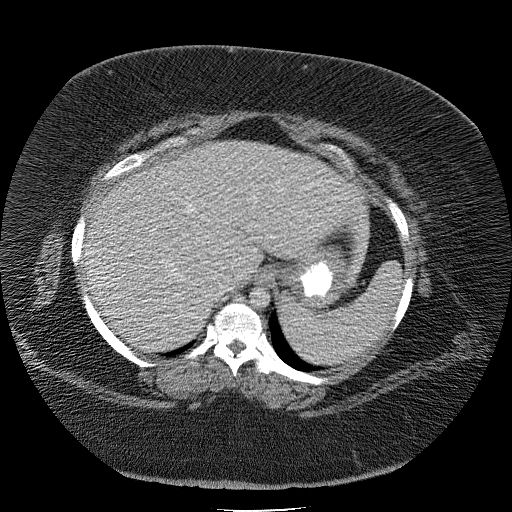
[im 121/142  lung]
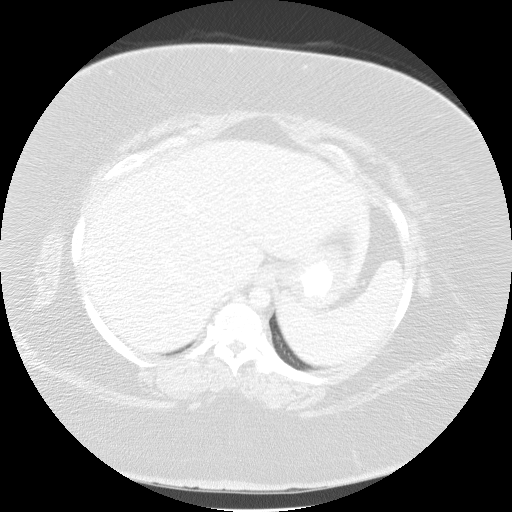
[im 128/142  lung]
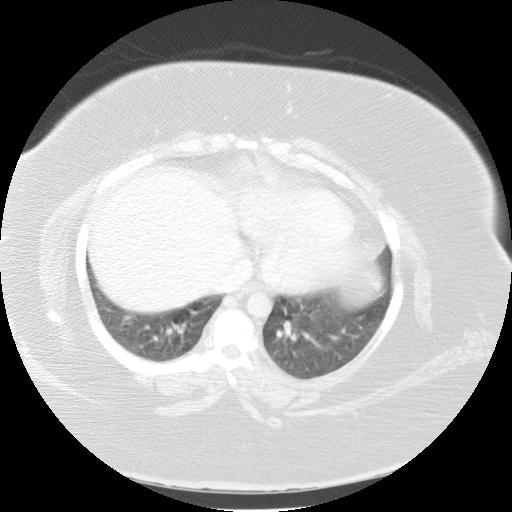
[im 135/142  soft-tissue]
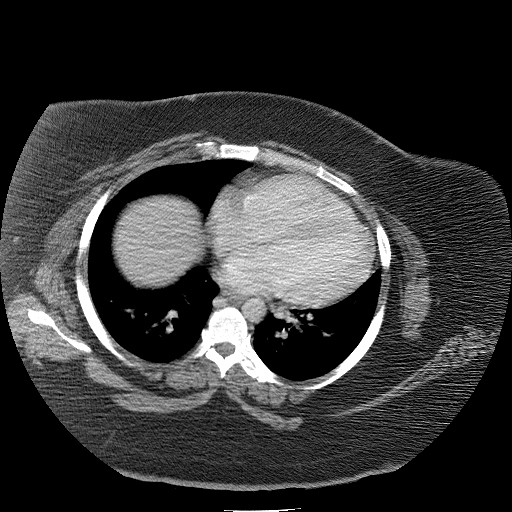
[im 135/142  lung]
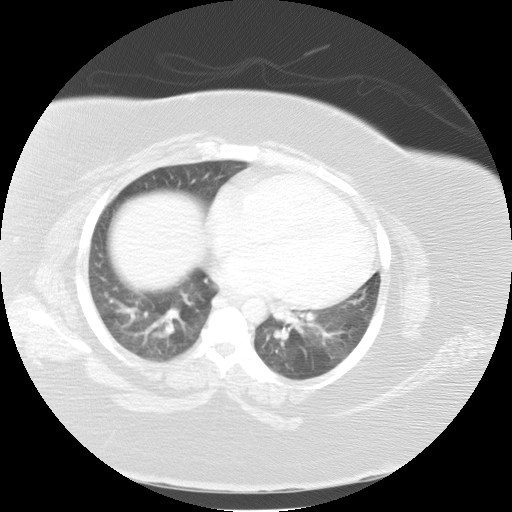

[14 of 32 positions shown; findings below may reference images not displayed]

## 2005-05-02 ENCOUNTER — Emergency Department (HOSPITAL_COMMUNITY): Admission: EM | Admit: 2005-05-02 | Discharge: 2005-05-03 | Payer: Self-pay | Admitting: Emergency Medicine

## 2005-05-16 ENCOUNTER — Emergency Department (HOSPITAL_COMMUNITY): Admission: EM | Admit: 2005-05-16 | Discharge: 2005-05-16 | Payer: Self-pay | Admitting: *Deleted

## 2005-06-13 ENCOUNTER — Inpatient Hospital Stay (HOSPITAL_COMMUNITY): Admission: AD | Admit: 2005-06-13 | Discharge: 2005-06-13 | Payer: Self-pay | Admitting: *Deleted

## 2005-06-26 IMAGING — CR DG CHEST 2V
2 series · 2 of 2 positions shown · non-contrast
Comparison: 03/13/04.

CLINICAL DATA: Chest pain.  History of asthma.  Diabetic.  
 PA AND LATERAL CHEST

[view not recorded (1 of 2)]
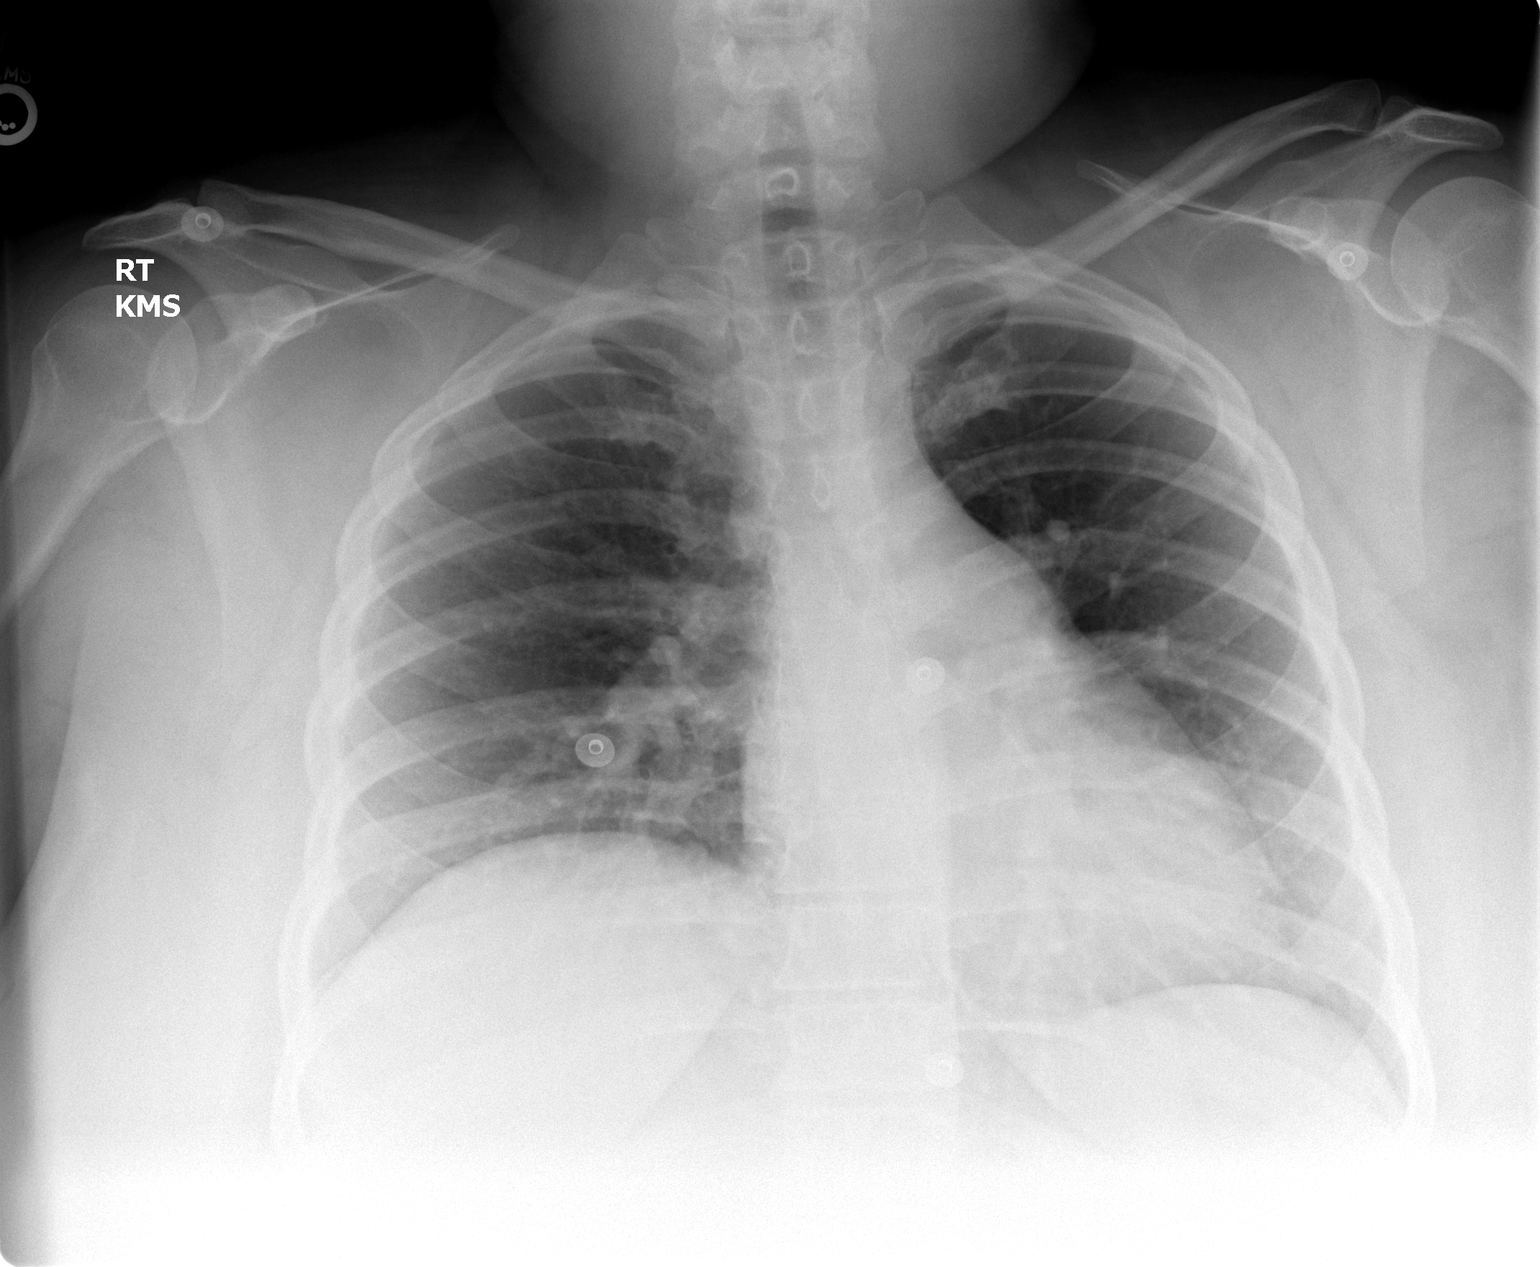

[view not recorded (2 of 2)]
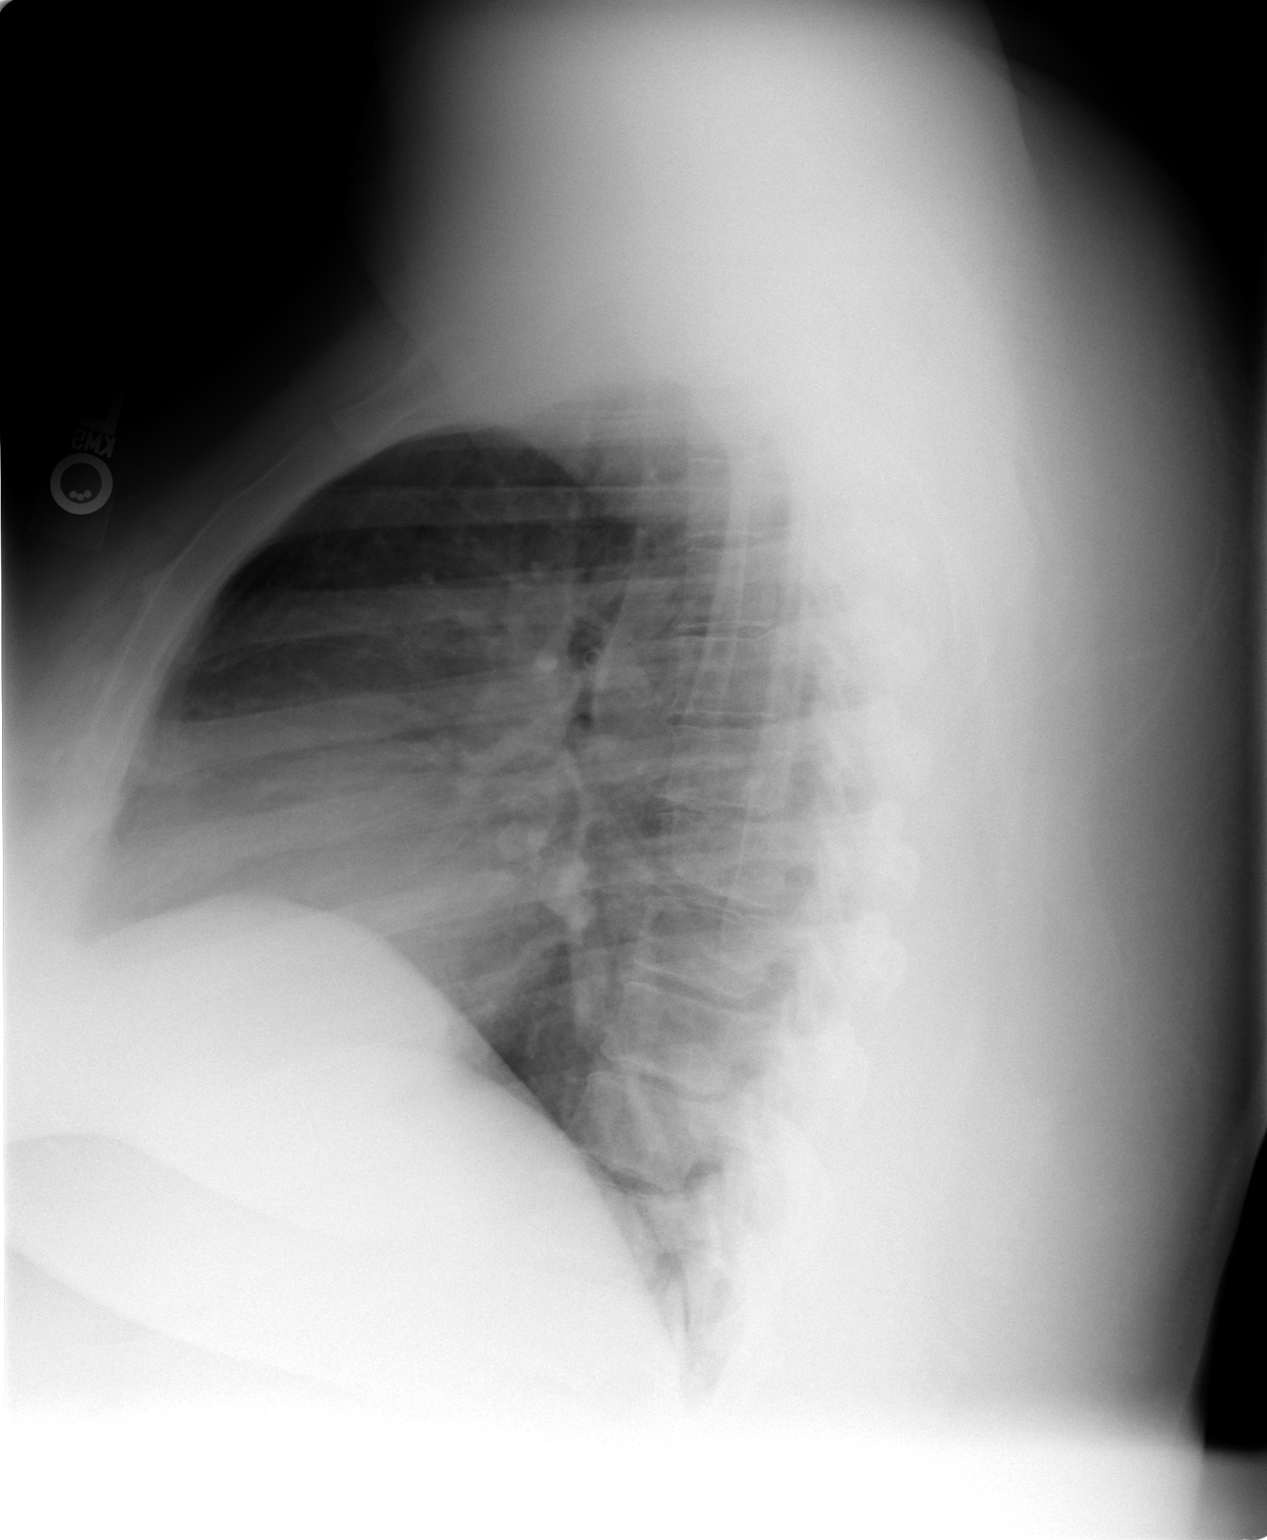

[2 of 2 positions shown; findings below may reference images not displayed]

FINDINGS: Mediastinal and cardiac silhouette stable.  No evidence of infiltrate, congestive heart failure or pneumothorax.  
 IMPRESSION
 No evidence of acute abnormality.

## 2005-07-01 ENCOUNTER — Other Ambulatory Visit: Admission: RE | Admit: 2005-07-01 | Discharge: 2005-07-01 | Payer: Self-pay | Admitting: Obstetrics and Gynecology

## 2005-07-09 IMAGING — CR DG FOOT COMPLETE 3+V*L*
3 series · 3 of 3 positions shown · non-contrast
Comparison: none

CLINICAL DATA: Swollen leg, knee and foot.  Pain. 
COMPLETE LEFT FOOT 
Remote fracture of the base of the fifth metatarsal noted.  No other significant abnormalities are identified. 
IMPRESSION
Remote proximal fifth metatarsal fracture, with non-union.

[view not recorded (1 of 3)]
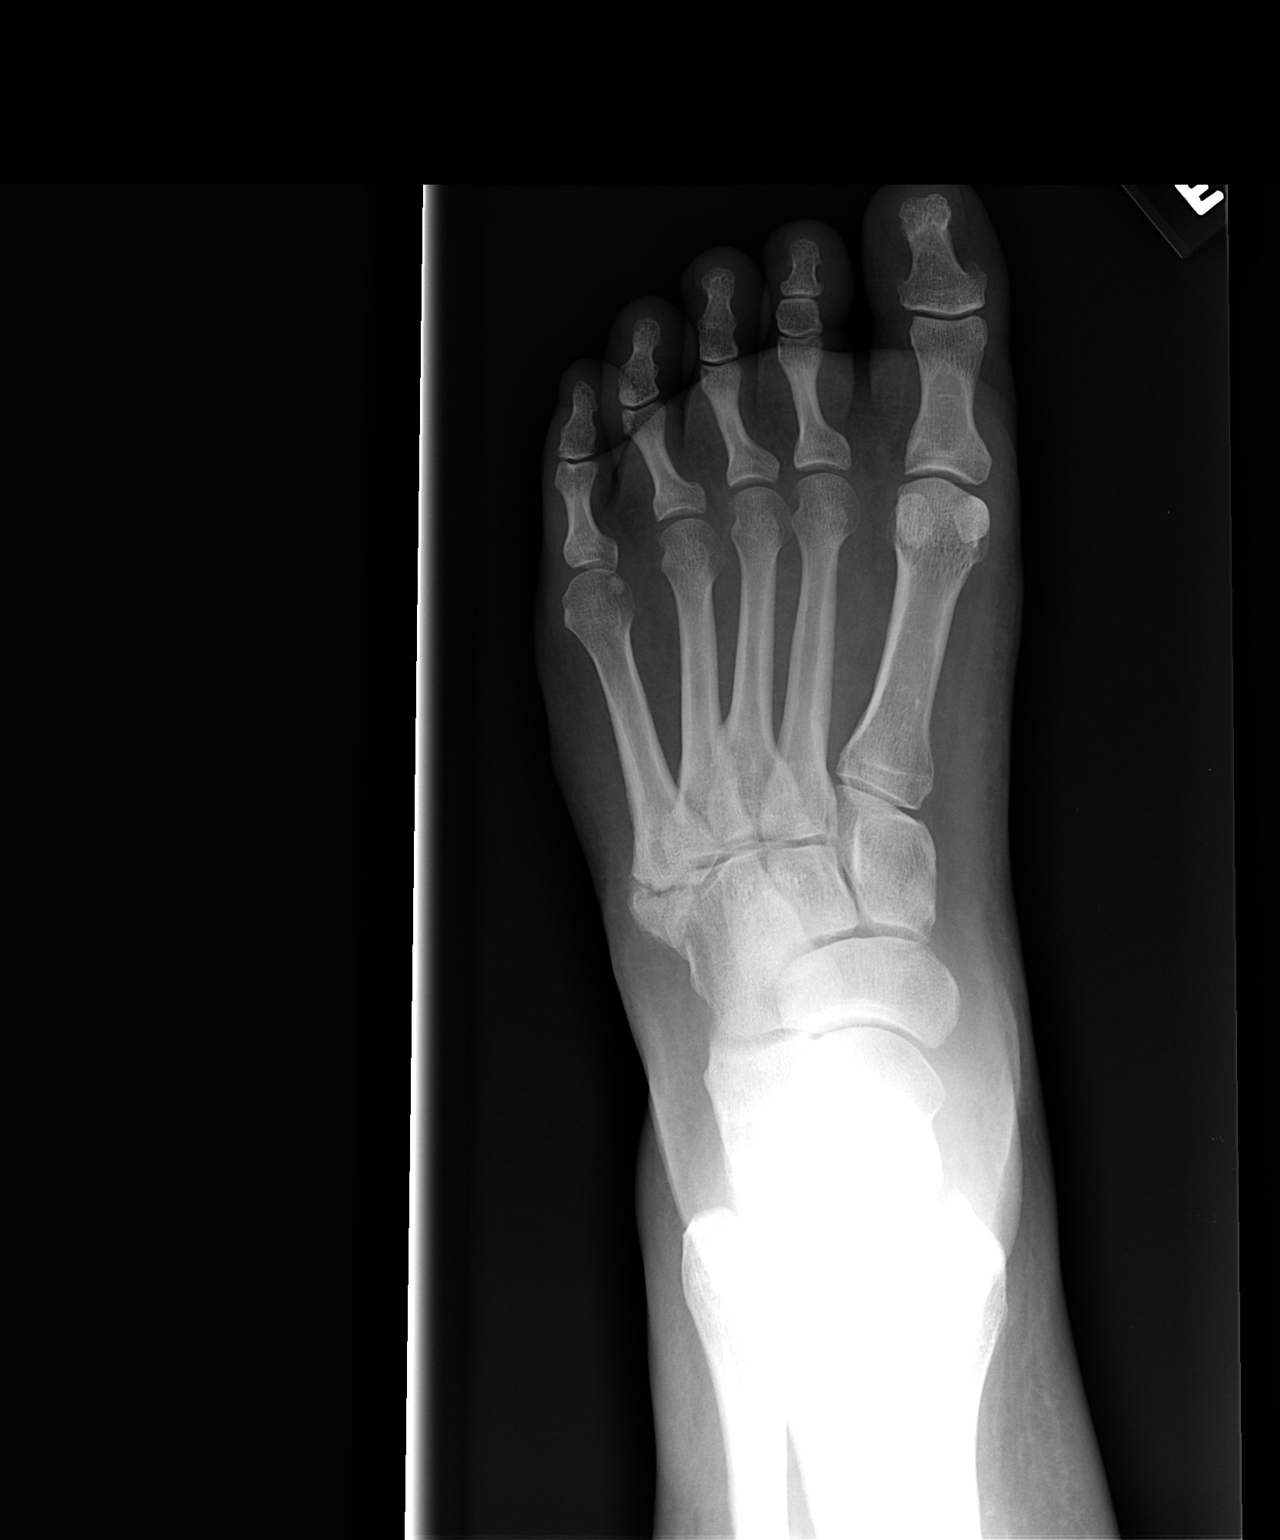

[view not recorded (2 of 3)]
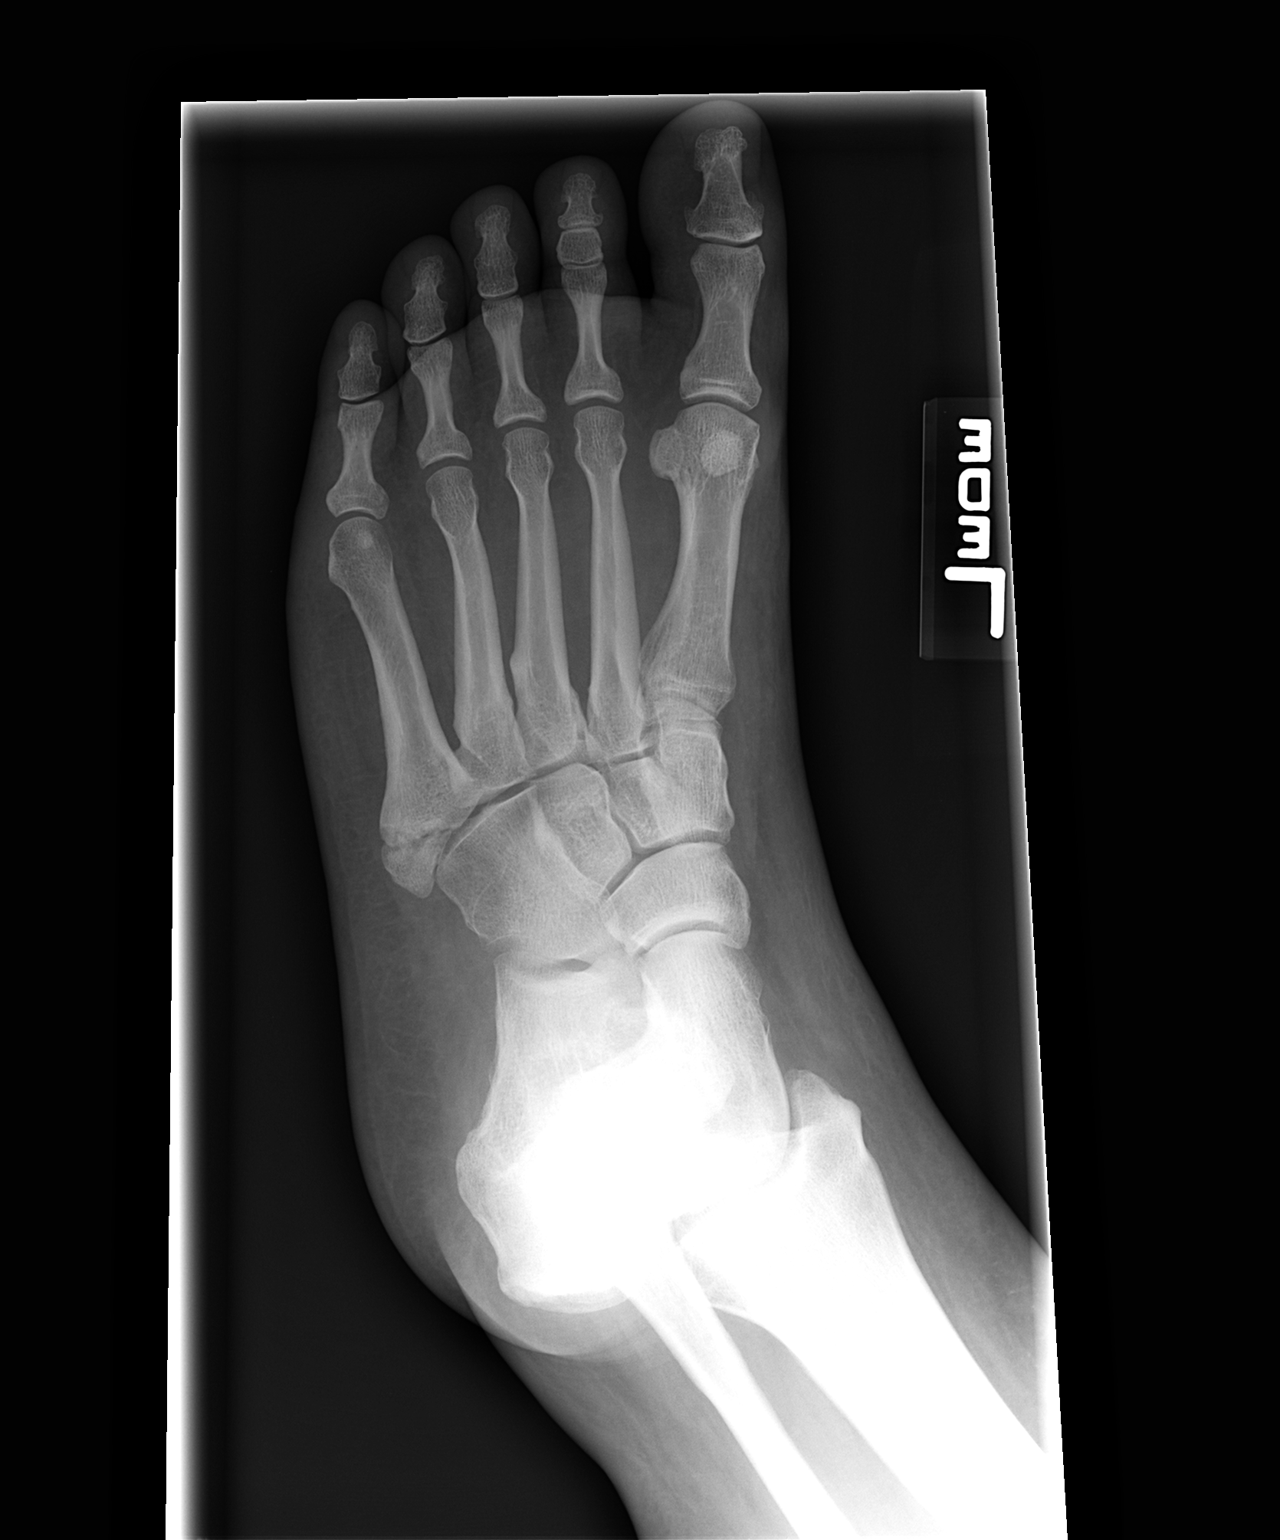

[view not recorded (3 of 3)]
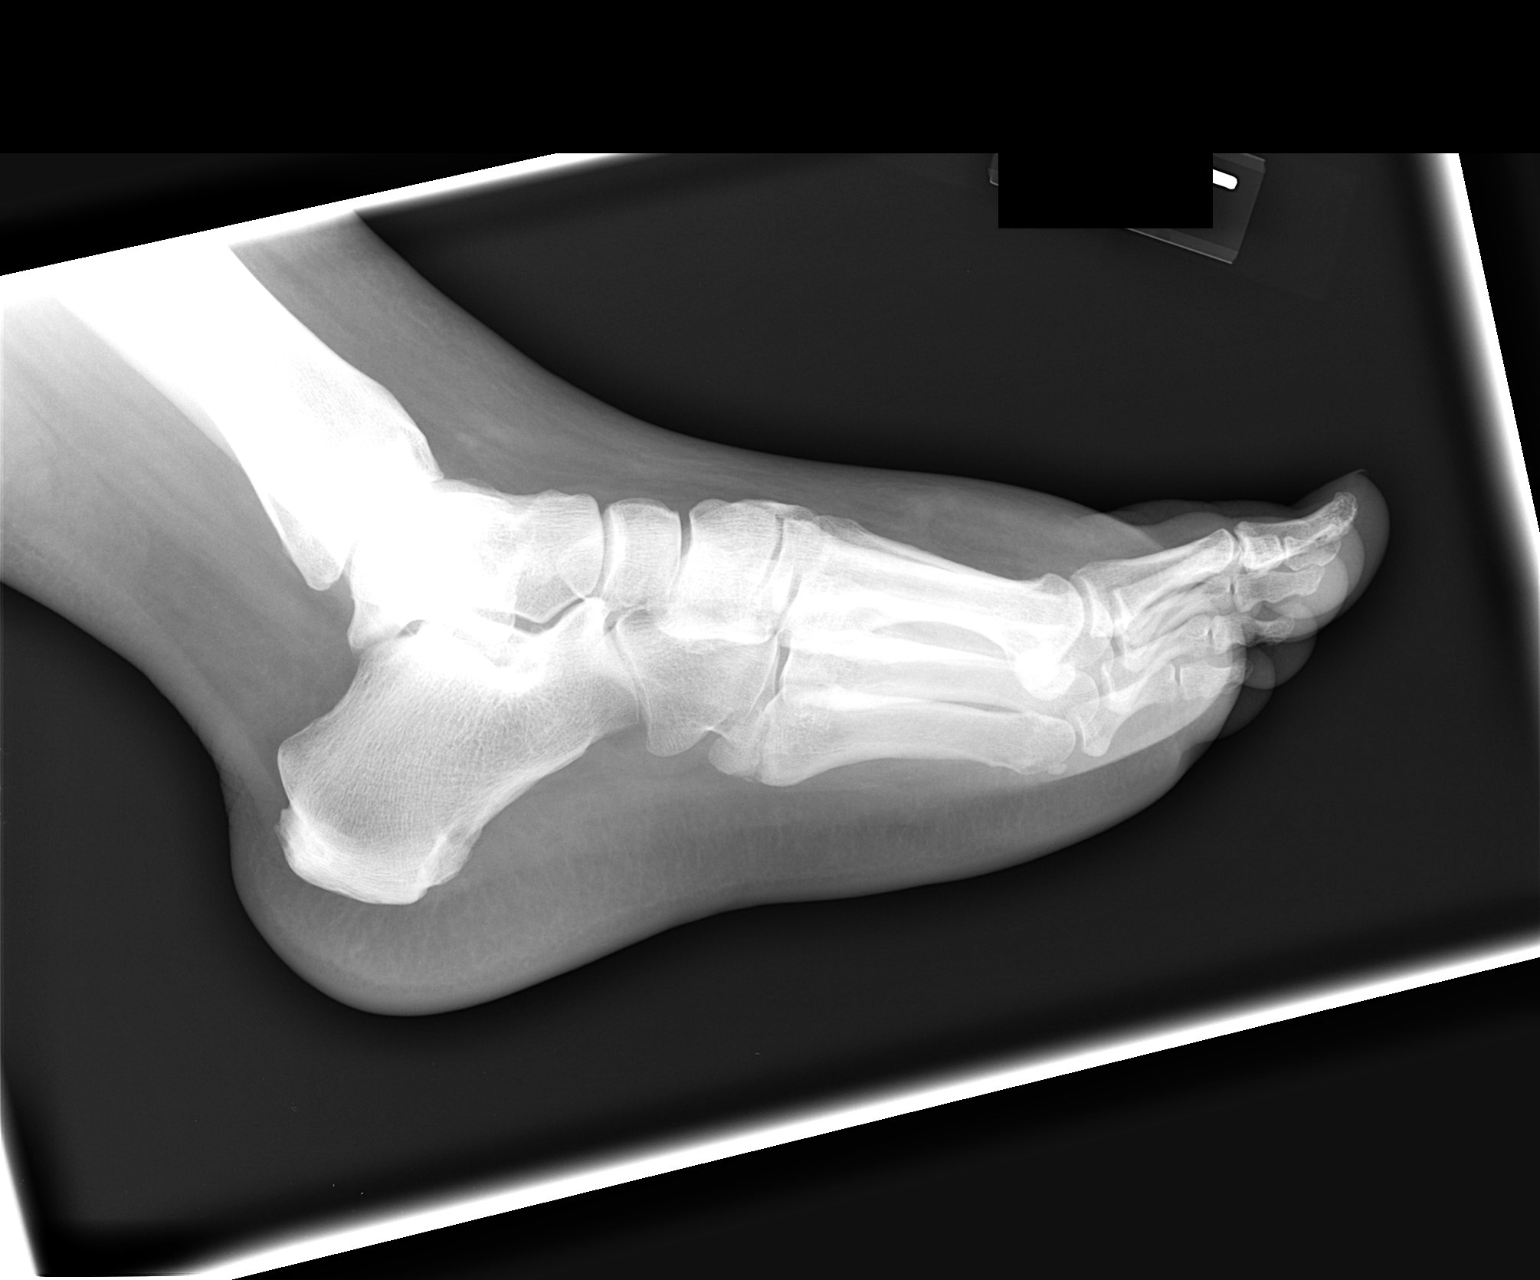

[3 of 3 positions shown; findings below may reference images not displayed]

## 2005-09-11 ENCOUNTER — Emergency Department (HOSPITAL_COMMUNITY): Admission: EM | Admit: 2005-09-11 | Discharge: 2005-09-11 | Payer: Self-pay | Admitting: Emergency Medicine

## 2005-09-16 ENCOUNTER — Inpatient Hospital Stay (HOSPITAL_COMMUNITY): Admission: AD | Admit: 2005-09-16 | Discharge: 2005-09-16 | Payer: Self-pay | Admitting: Obstetrics and Gynecology

## 2005-09-29 ENCOUNTER — Emergency Department (HOSPITAL_COMMUNITY): Admission: EM | Admit: 2005-09-29 | Discharge: 2005-09-29 | Payer: Self-pay | Admitting: Emergency Medicine

## 2005-10-02 ENCOUNTER — Emergency Department (HOSPITAL_COMMUNITY): Admission: EM | Admit: 2005-10-02 | Discharge: 2005-10-03 | Payer: Self-pay | Admitting: Emergency Medicine

## 2005-10-10 ENCOUNTER — Inpatient Hospital Stay (HOSPITAL_COMMUNITY): Admission: AD | Admit: 2005-10-10 | Discharge: 2005-10-11 | Payer: Self-pay | Admitting: Obstetrics and Gynecology
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
## 2005-11-02 ENCOUNTER — Emergency Department (HOSPITAL_COMMUNITY): Admission: EM | Admit: 2005-11-02 | Discharge: 2005-11-02 | Payer: Self-pay | Admitting: Family Medicine

## 2005-11-06 ENCOUNTER — Emergency Department (HOSPITAL_COMMUNITY): Admission: EM | Admit: 2005-11-06 | Discharge: 2005-11-06 | Payer: Self-pay | Admitting: Emergency Medicine

## 2005-11-15 ENCOUNTER — Emergency Department (HOSPITAL_COMMUNITY): Admission: EM | Admit: 2005-11-15 | Discharge: 2005-11-15 | Payer: Self-pay | Admitting: *Deleted

## 2005-12-01 ENCOUNTER — Emergency Department (HOSPITAL_COMMUNITY): Admission: EM | Admit: 2005-12-01 | Discharge: 2005-12-01 | Payer: Self-pay | Admitting: *Deleted

## 2005-12-04 ENCOUNTER — Inpatient Hospital Stay (HOSPITAL_COMMUNITY): Admission: AD | Admit: 2005-12-04 | Discharge: 2005-12-05 | Payer: Self-pay | Admitting: Obstetrics and Gynecology

## 2006-01-01 IMAGING — CR DG CHEST 2V
2 series · 2 of 2 positions shown · non-contrast
Comparison: 05/17/04.

CLINICAL DATA: Chest pain. 
 CHEST, TWO VIEWS, 11/22/04, AT 2279 HOURS:

[view not recorded (1 of 2)]
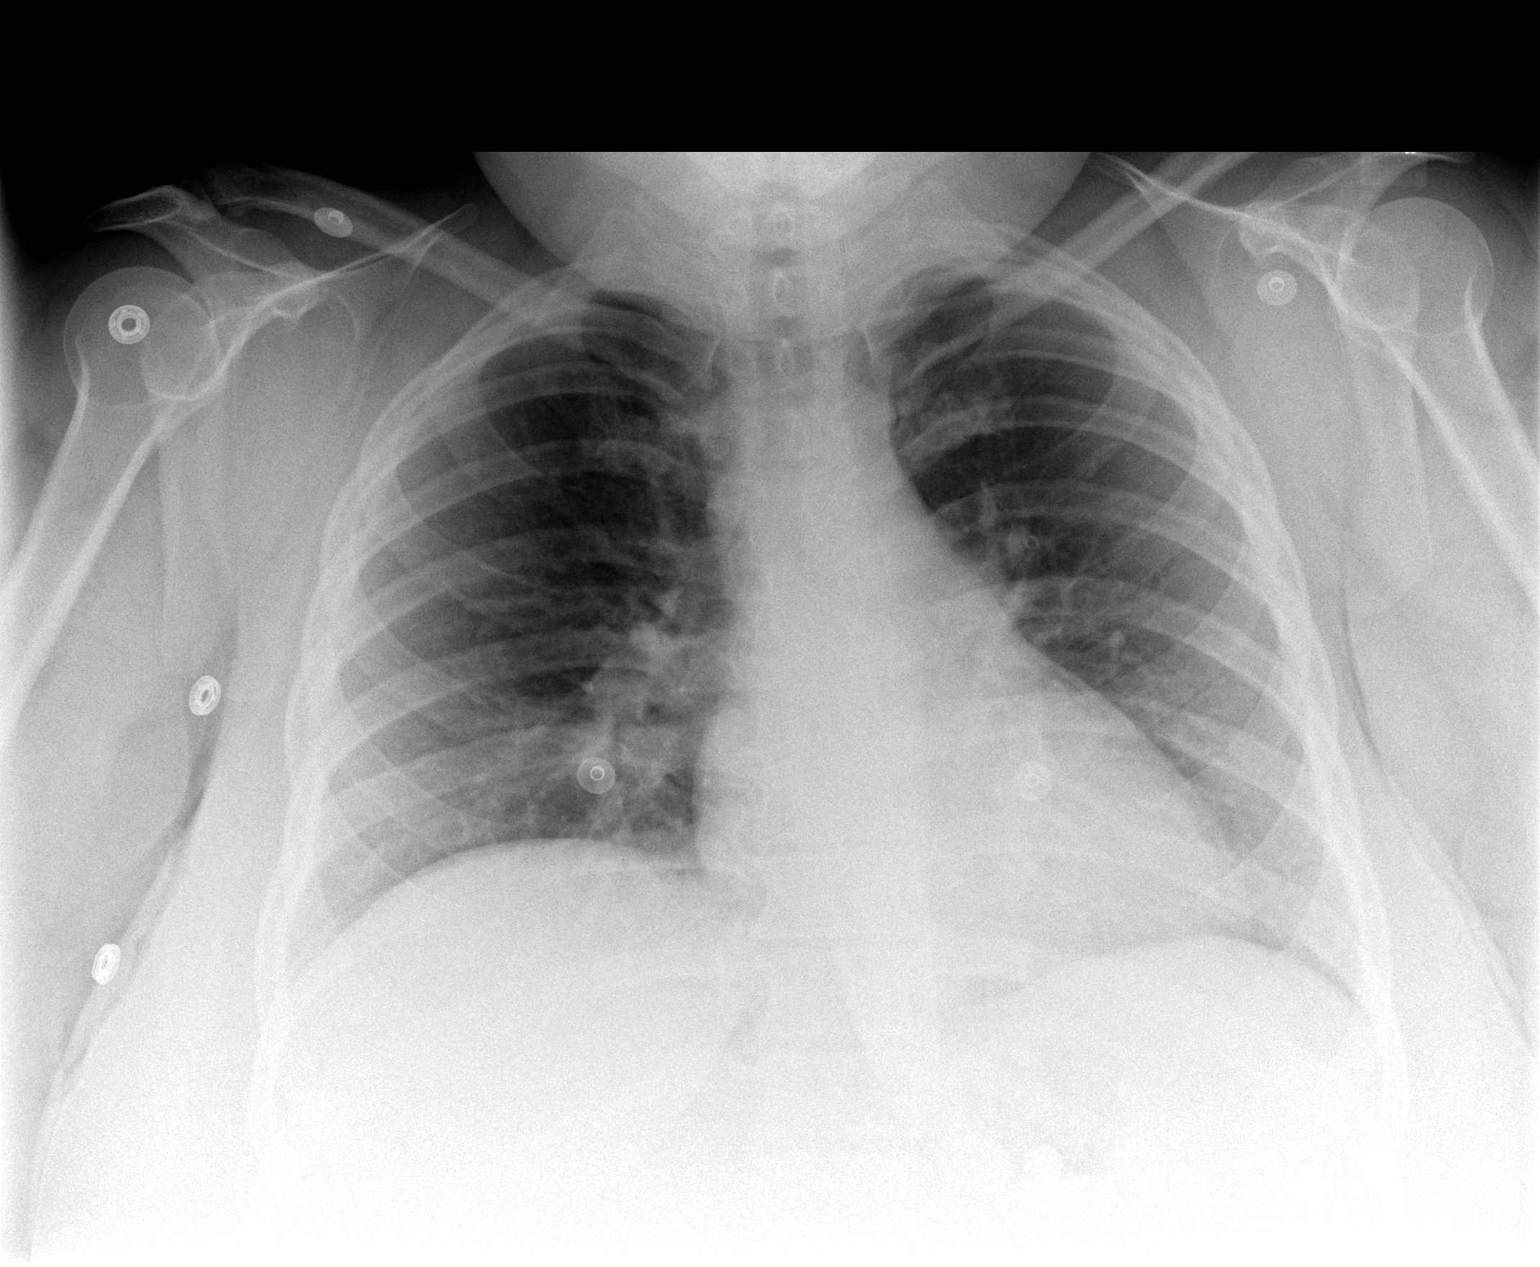

[view not recorded (2 of 2)]
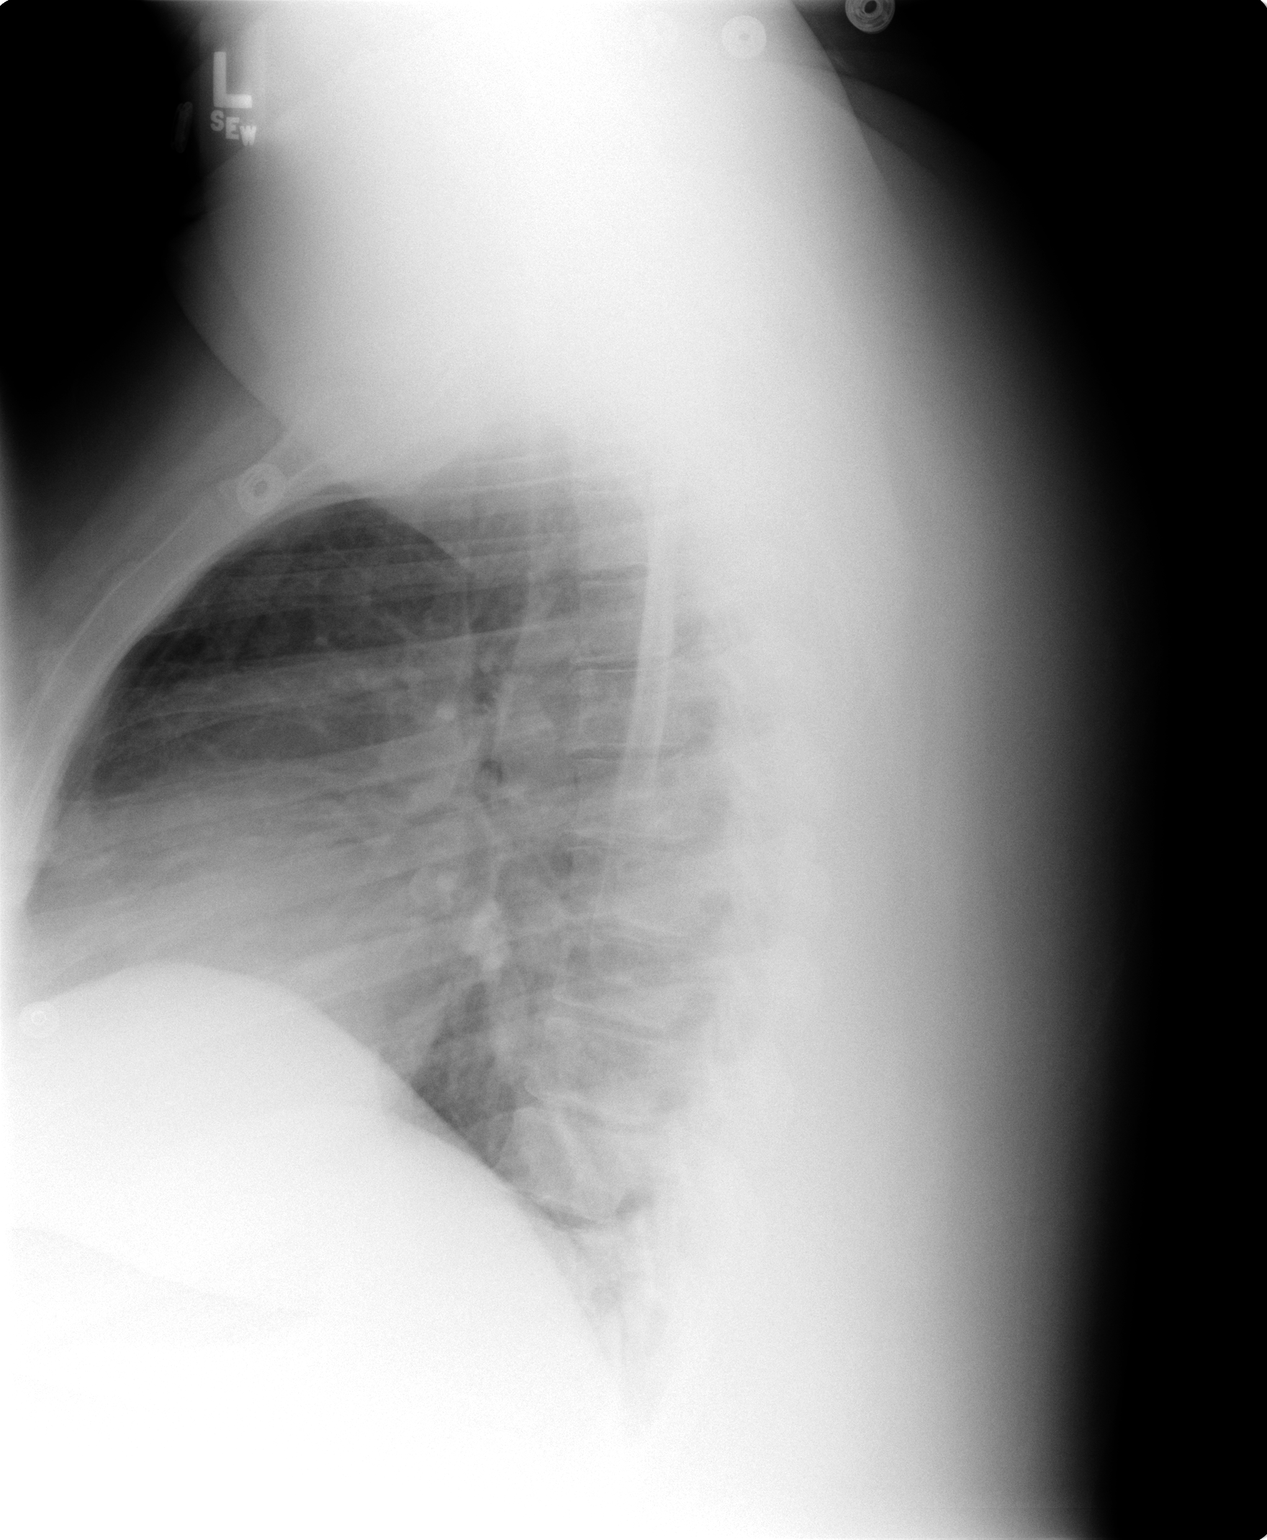

[2 of 2 positions shown; findings below may reference images not displayed]

FINDINGS: The cardiomediastinal silhouette is within normal limits.  The lungs are under-inflated and clear.  No pneumothorax or effusions are seen.
IMPRESSION: No evidence of acute cardiopulmonary disease.

## 2006-01-10 ENCOUNTER — Encounter: Admission: RE | Admit: 2006-01-10 | Discharge: 2006-01-10 | Payer: Self-pay | Admitting: Obstetrics and Gynecology

## 2006-01-10 ENCOUNTER — Ambulatory Visit (HOSPITAL_COMMUNITY): Admission: RE | Admit: 2006-01-10 | Discharge: 2006-01-10 | Payer: Self-pay | Admitting: Obstetrics and Gynecology

## 2006-01-20 ENCOUNTER — Inpatient Hospital Stay (HOSPITAL_COMMUNITY): Admission: AD | Admit: 2006-01-20 | Discharge: 2006-01-20 | Payer: Self-pay | Admitting: Obstetrics and Gynecology

## 2006-01-31 IMAGING — CR DG CHEST 1V PORT
1 series · 1 of 1 positions shown · non-contrast
Comparison: 12/20/2004

CLINICAL DATA: Overdose, lethargic

PORTABLE CHEST - 1 VIEW:

[view not recorded]
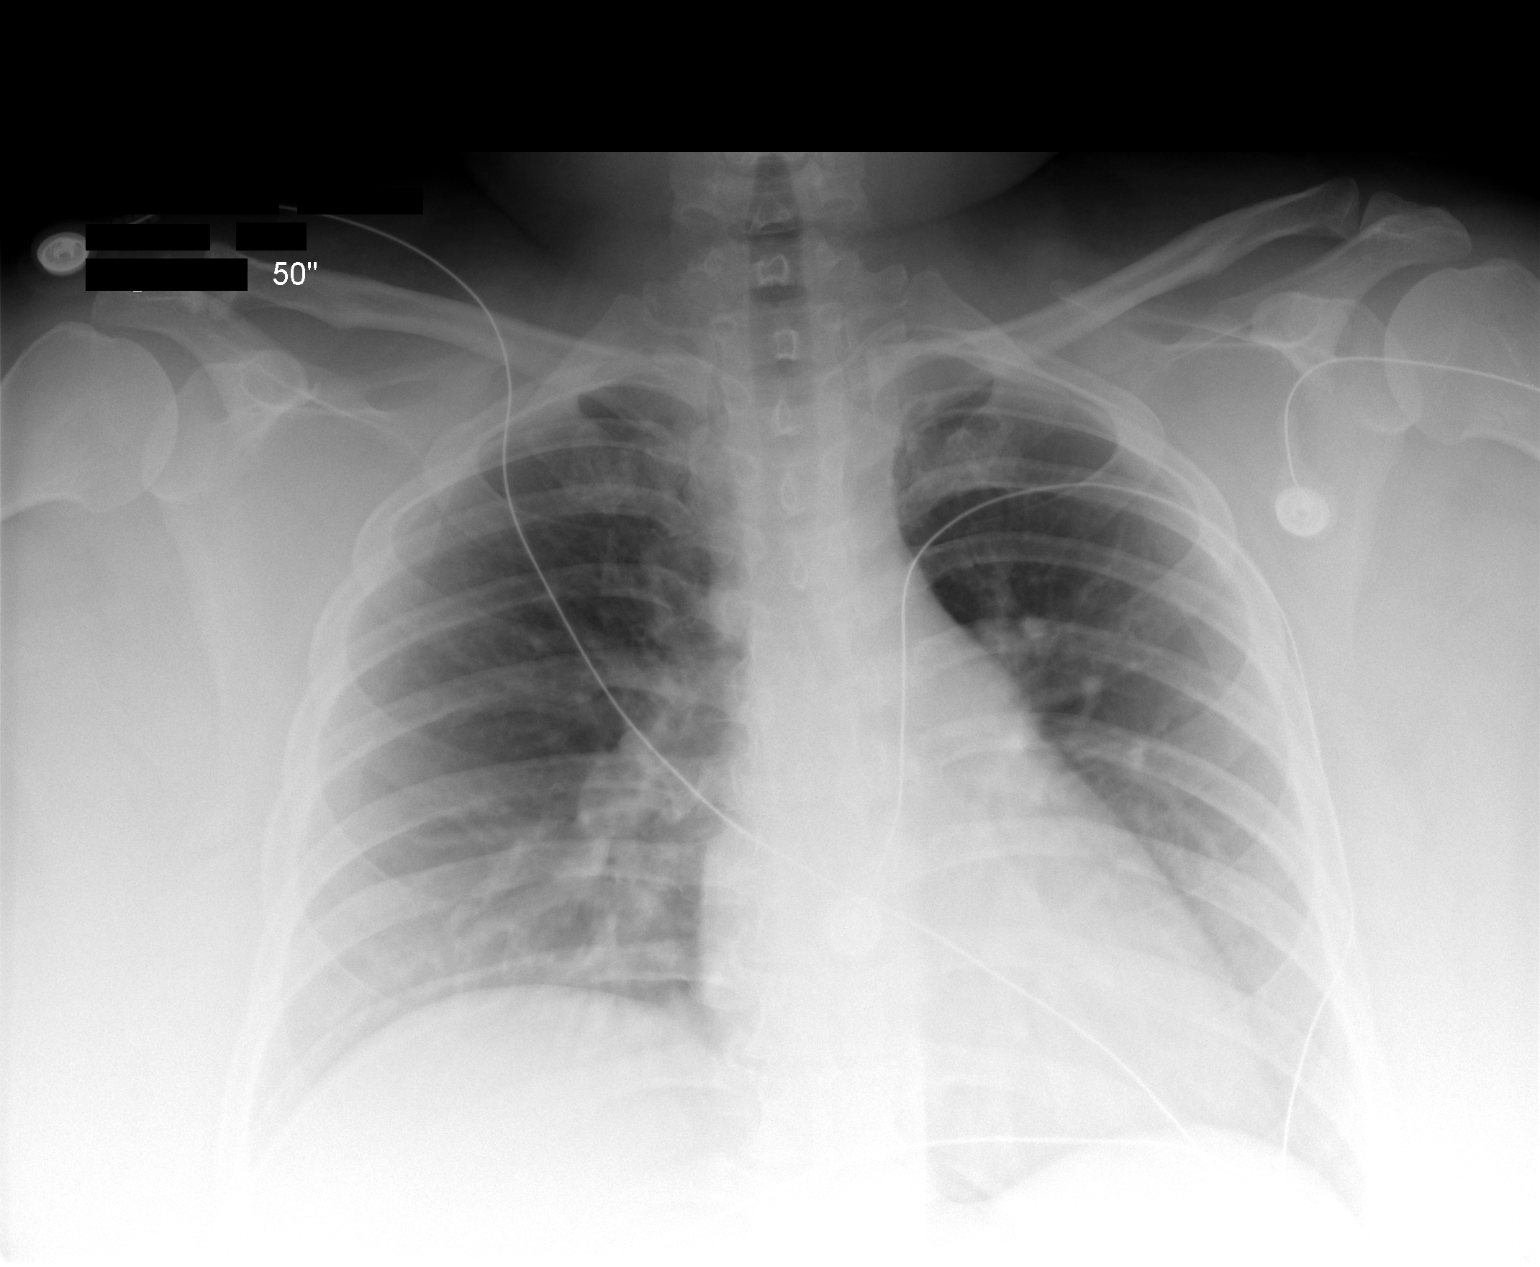

[1 of 1 positions shown; findings below may reference images not displayed]

FINDINGS: The heart size and mediastinal contours are within normal limits. 
Both lungs are clear.
IMPRESSION: No acute disease.

## 2006-02-14 ENCOUNTER — Inpatient Hospital Stay (HOSPITAL_COMMUNITY): Admission: AD | Admit: 2006-02-14 | Discharge: 2006-02-15 | Payer: Self-pay | Admitting: Obstetrics and Gynecology

## 2006-03-01 ENCOUNTER — Inpatient Hospital Stay (HOSPITAL_COMMUNITY): Admission: AD | Admit: 2006-03-01 | Discharge: 2006-03-01 | Payer: Self-pay | Admitting: Obstetrics and Gynecology

## 2006-03-03 ENCOUNTER — Ambulatory Visit (HOSPITAL_COMMUNITY): Admission: RE | Admit: 2006-03-03 | Discharge: 2006-03-03 | Payer: Self-pay | Admitting: Obstetrics & Gynecology

## 2006-03-08 ENCOUNTER — Inpatient Hospital Stay (HOSPITAL_COMMUNITY): Admission: AD | Admit: 2006-03-08 | Discharge: 2006-03-11 | Payer: Self-pay | Admitting: Obstetrics and Gynecology

## 2006-03-12 ENCOUNTER — Inpatient Hospital Stay (HOSPITAL_COMMUNITY): Admission: AD | Admit: 2006-03-12 | Discharge: 2006-03-13 | Payer: Self-pay | Admitting: Obstetrics and Gynecology

## 2006-03-13 ENCOUNTER — Inpatient Hospital Stay (HOSPITAL_COMMUNITY): Admission: AD | Admit: 2006-03-13 | Discharge: 2006-03-13 | Payer: Self-pay | Admitting: Obstetrics and Gynecology

## 2006-03-15 ENCOUNTER — Ambulatory Visit: Payer: Self-pay | Admitting: *Deleted

## 2006-03-15 ENCOUNTER — Inpatient Hospital Stay (HOSPITAL_COMMUNITY): Admission: AD | Admit: 2006-03-15 | Discharge: 2006-04-08 | Payer: Self-pay | Admitting: Obstetrics and Gynecology

## 2006-03-17 ENCOUNTER — Ambulatory Visit: Payer: Self-pay | Admitting: Neonatology

## 2006-03-20 IMAGING — CR DG LUMBAR SPINE COMPLETE 4+V
5 series · 5 of 5 positions shown · non-contrast
Comparison: None.

CLINICAL DATA: Back pain. 
 LUMBAR SPINE COMPLETE - 4 VIEW - 02/08/05:

[view not recorded (1 of 5)]
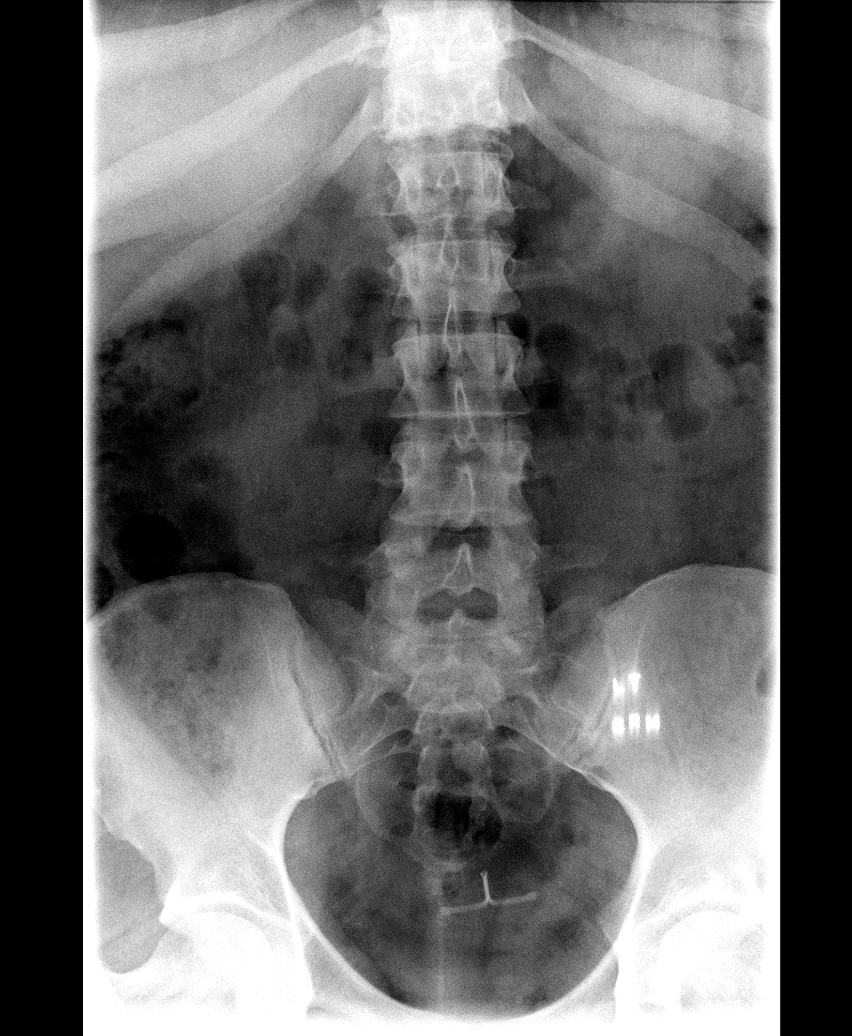

[view not recorded (2 of 5)]
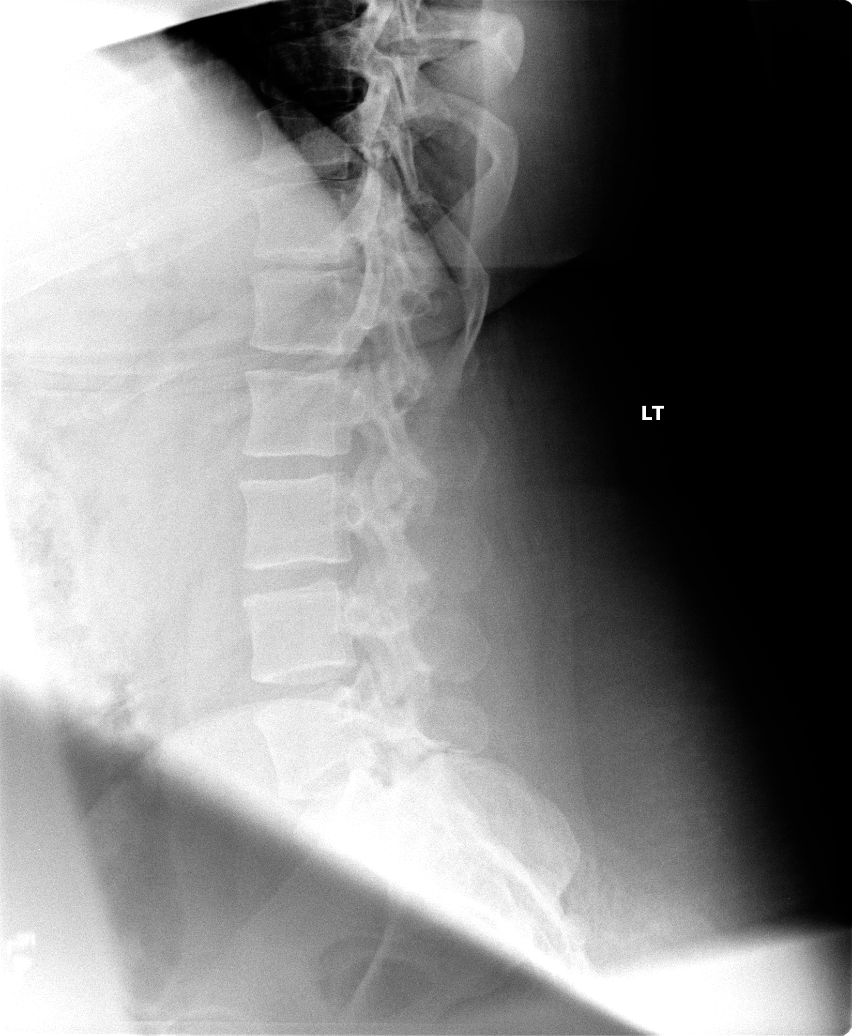

[view not recorded (3 of 5)]
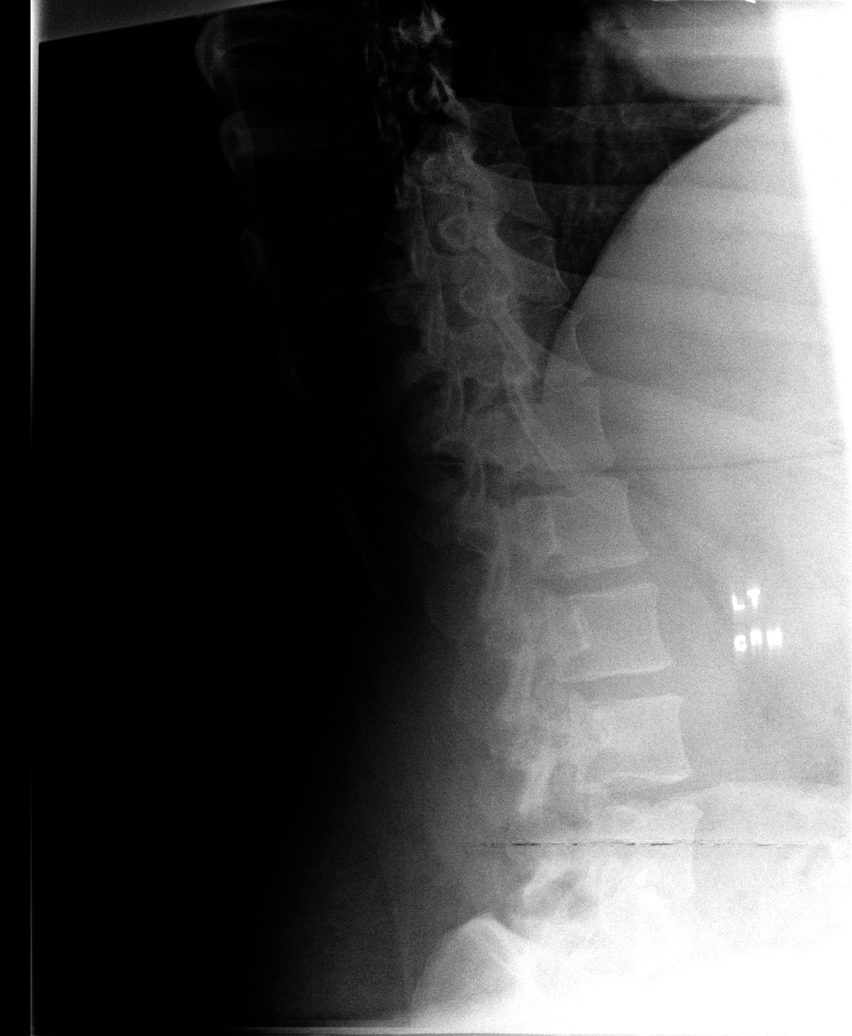

[view not recorded (4 of 5)]
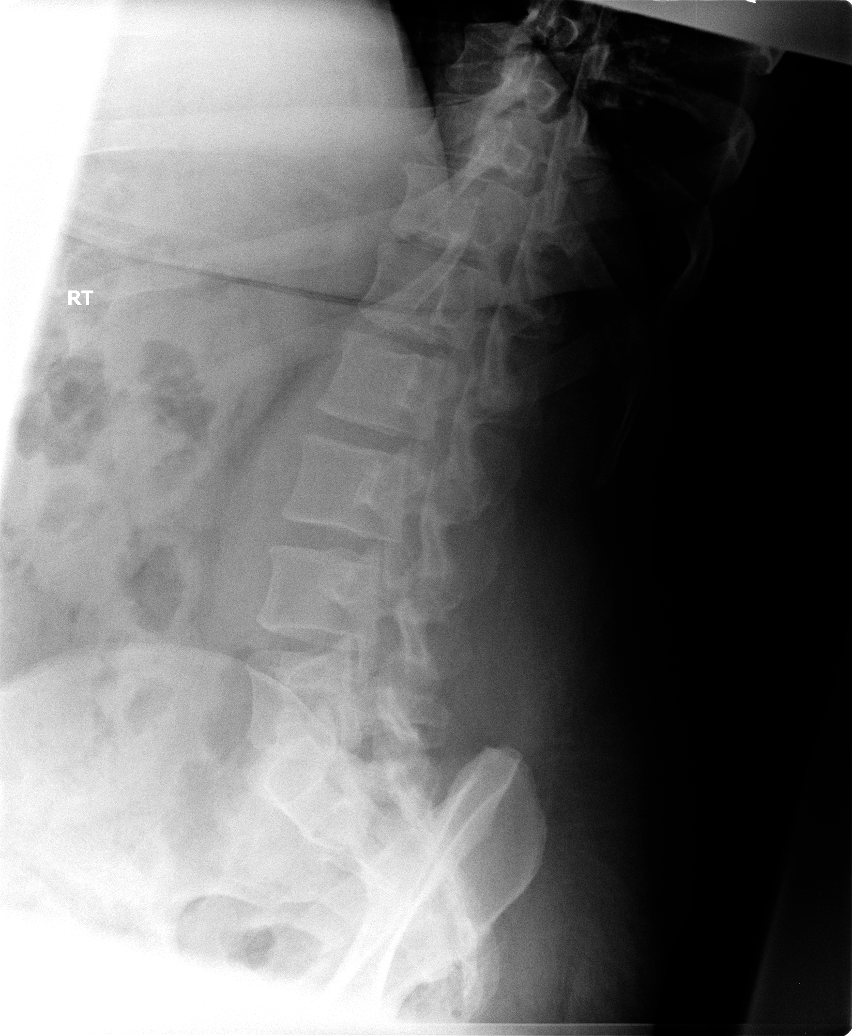

[view not recorded (5 of 5)]
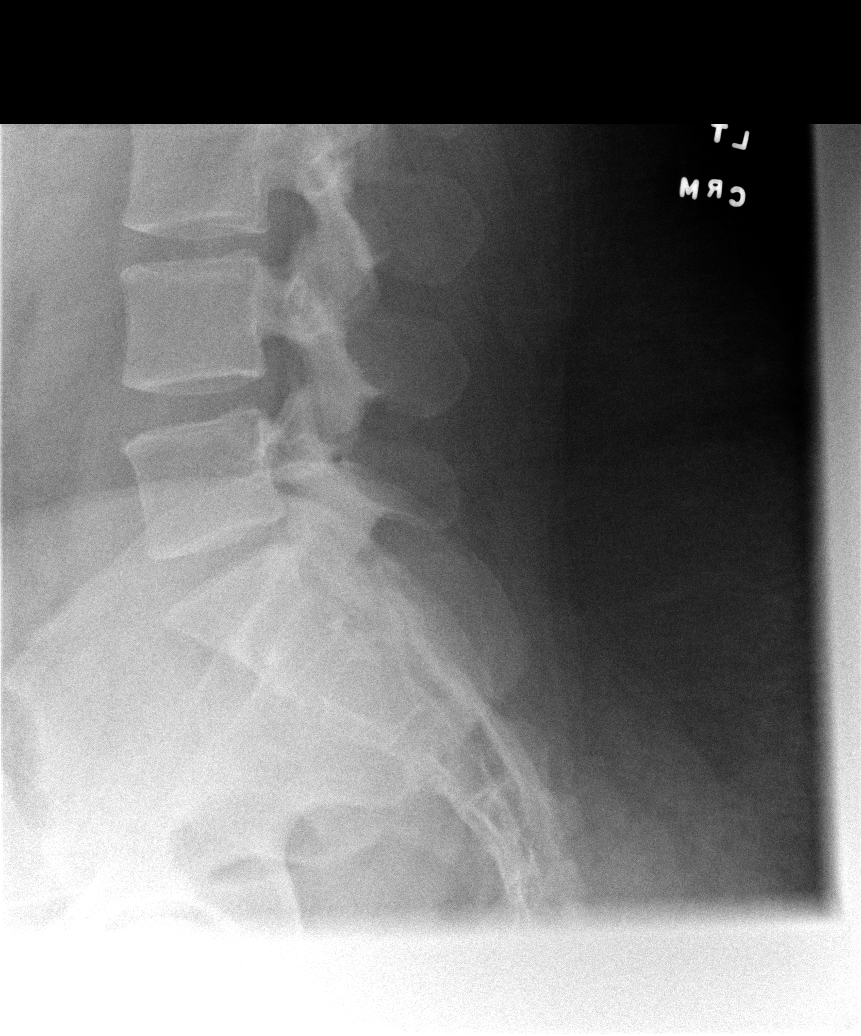

[5 of 5 positions shown; findings below may reference images not displayed]

No evidence for acute fracture or subluxation.  The intervertebral disk spaces are preserved.  The patient has five non-rib-bearing lumbar type vertebral bodies.  IUD projects over the anatomic pelvis.
IMPRESSION: No acute bony abnormality.

## 2006-04-04 IMAGING — CR DG CHEST 2V
2 series · 2 of 2 positions shown · non-contrast
Comparison: none

CLINICAL DATA: Passed out, dizziness, hypertension.   Shortness of breath, smoking history. 
 CHEST (2 VIEW):
 Heart size and mediastinal contours are unremarkable.  The lungs are clear.  The visualized skeleton is unremarkable.

[view not recorded (1 of 2)]
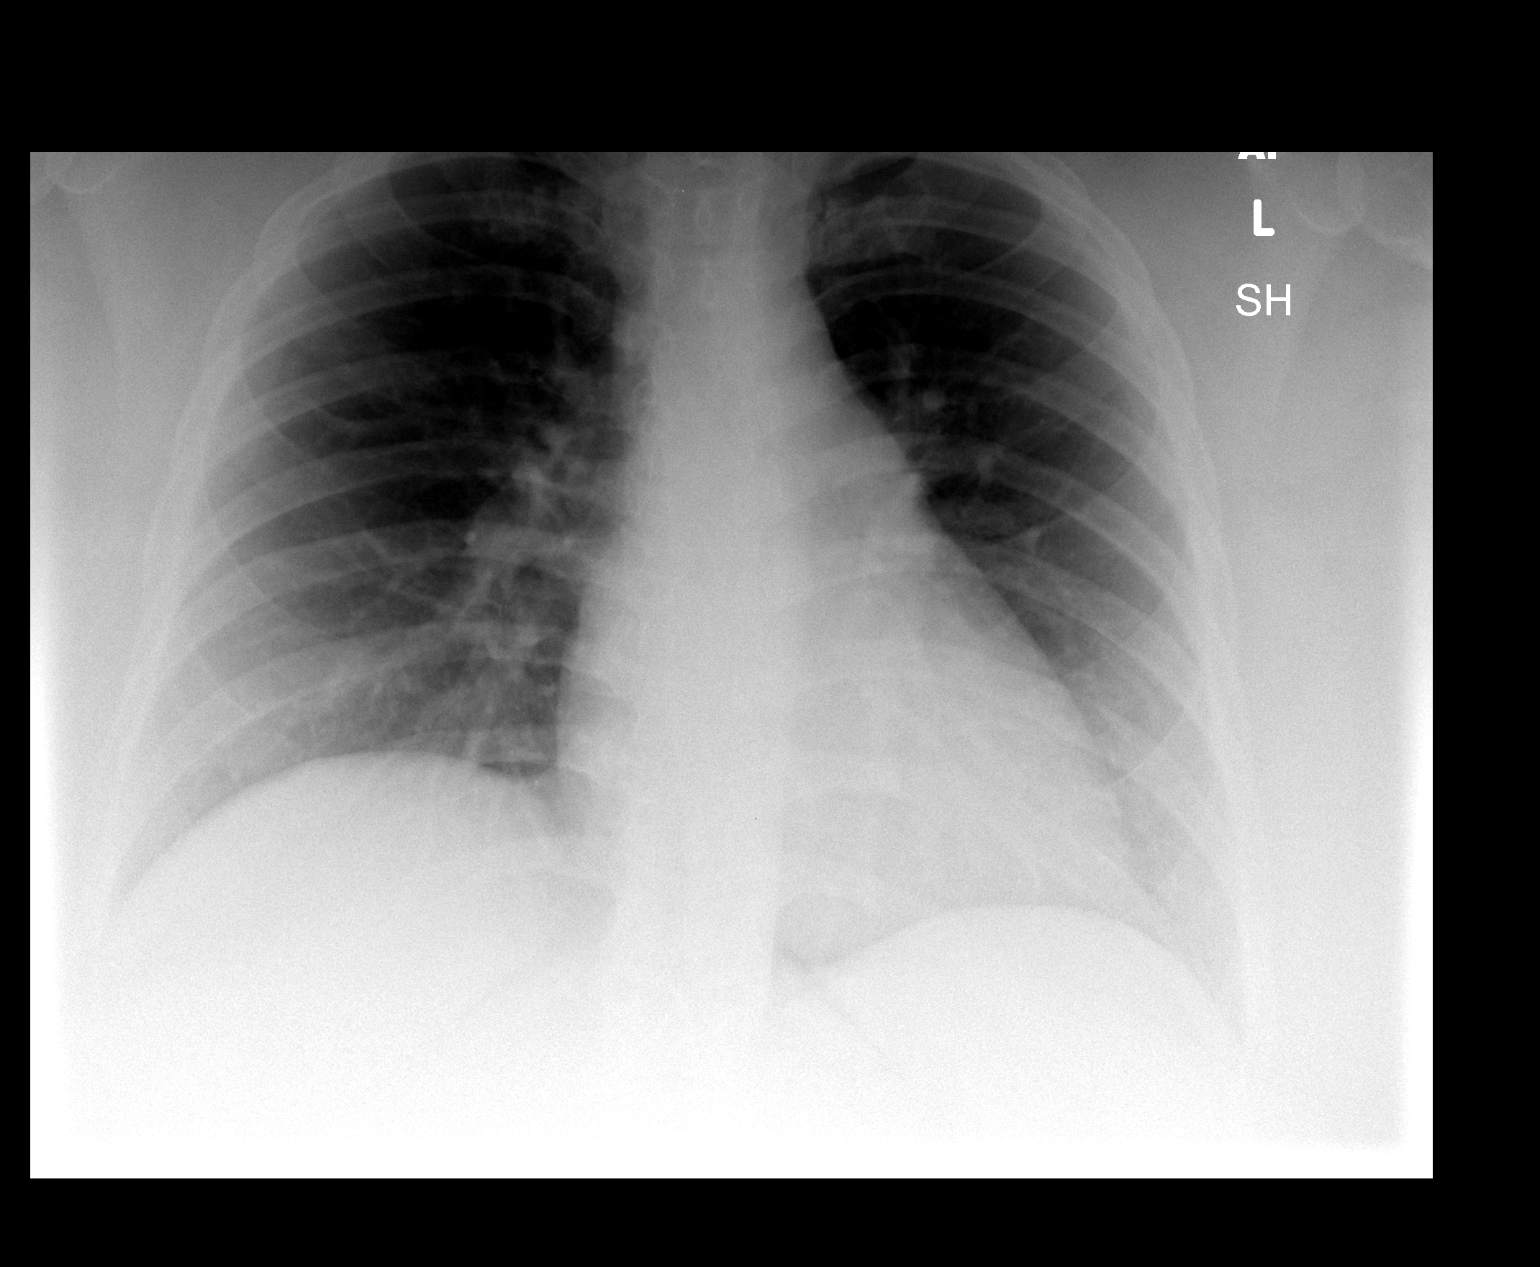

[view not recorded (2 of 2)]
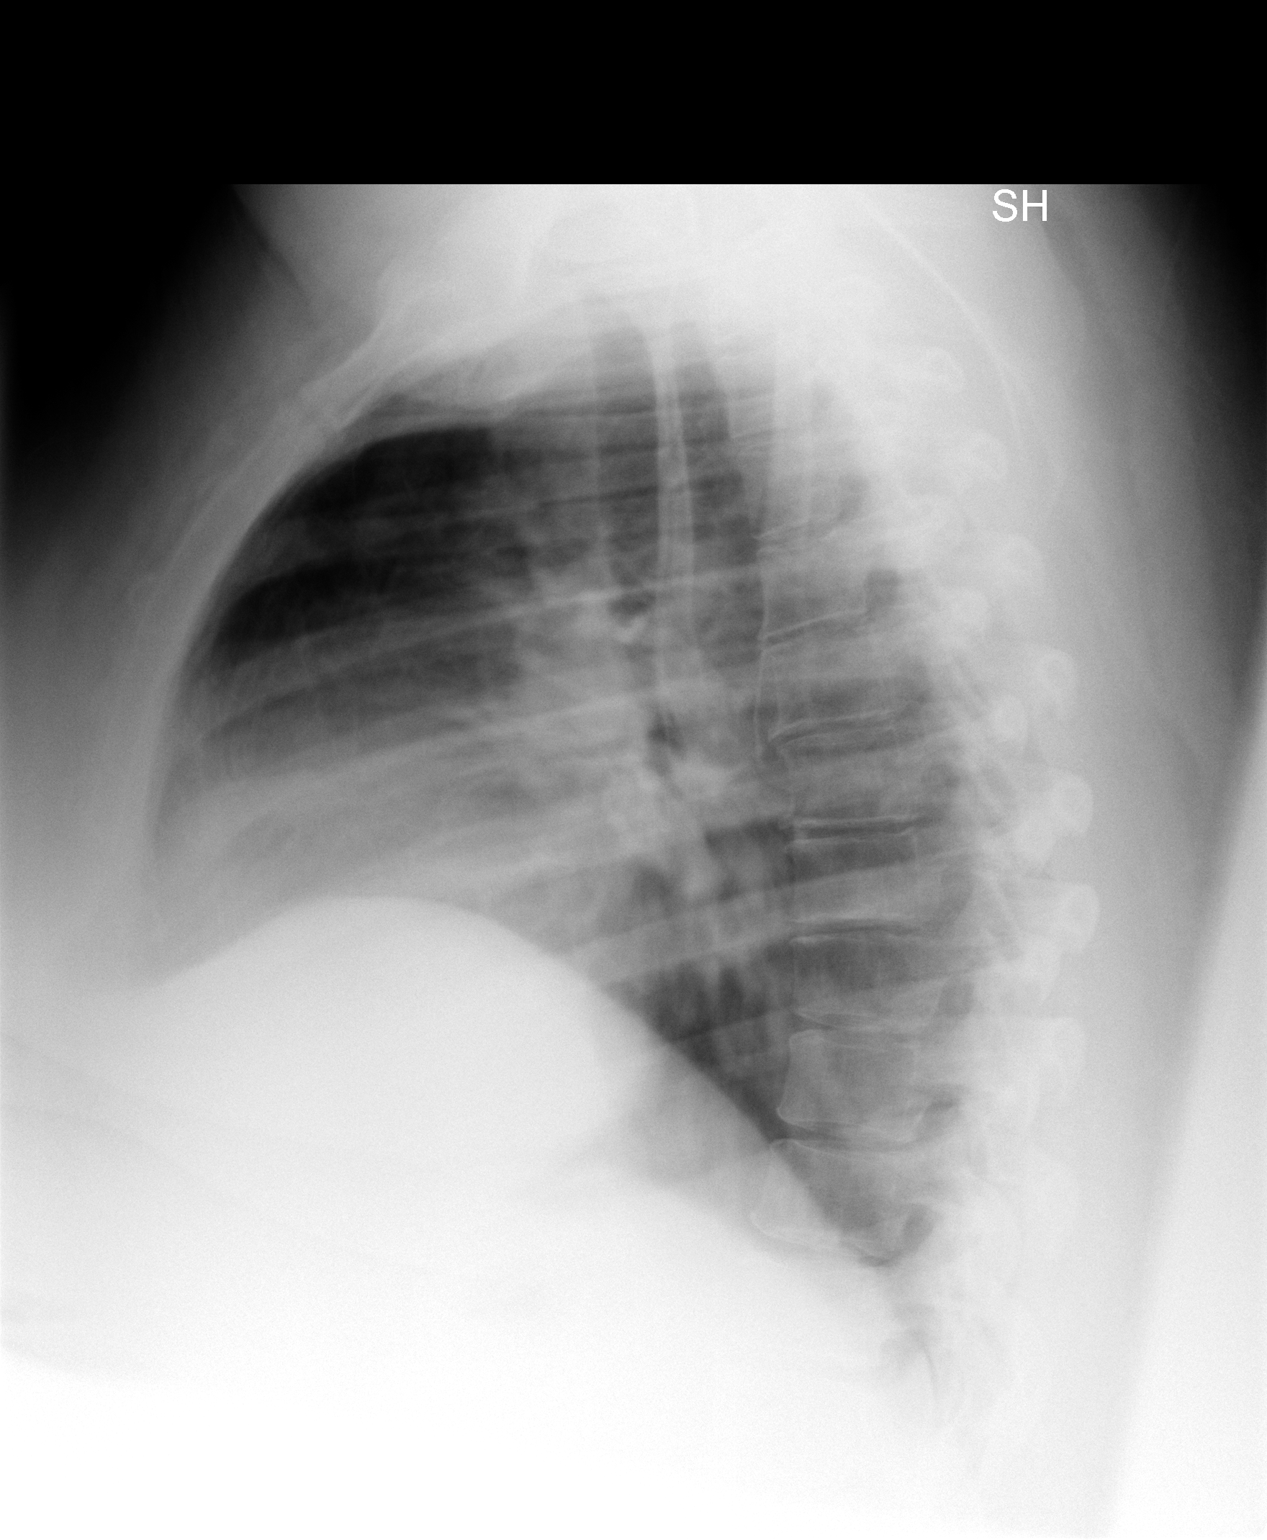

[2 of 2 positions shown; findings below may reference images not displayed]

IMPRESSION: No active disease.

## 2006-04-05 ENCOUNTER — Encounter (INDEPENDENT_AMBULATORY_CARE_PROVIDER_SITE_OTHER): Payer: Self-pay | Admitting: *Deleted

## 2006-04-10 ENCOUNTER — Inpatient Hospital Stay (HOSPITAL_COMMUNITY): Admission: AD | Admit: 2006-04-10 | Discharge: 2006-04-14 | Payer: Self-pay | Admitting: Obstetrics and Gynecology

## 2006-04-13 IMAGING — CR DG ANKLE COMPLETE 3+V*L*
3 series · 3 of 3 positions shown · non-contrast
Comparison: none

CLINICAL DATA: Fall with lateral left ankle pain. 
 LEFT ANKLE ? 3 VIEW:
 No evidence of acute fracture or dislocation.  On the lateral projection, there is some sclerosis associated with the talus and superior articular surface of the calcaneus at the level of the subtalar joints.  This appears chronic.  Mild spurring is present involving the calcaneus.

[view not recorded (1 of 3)]
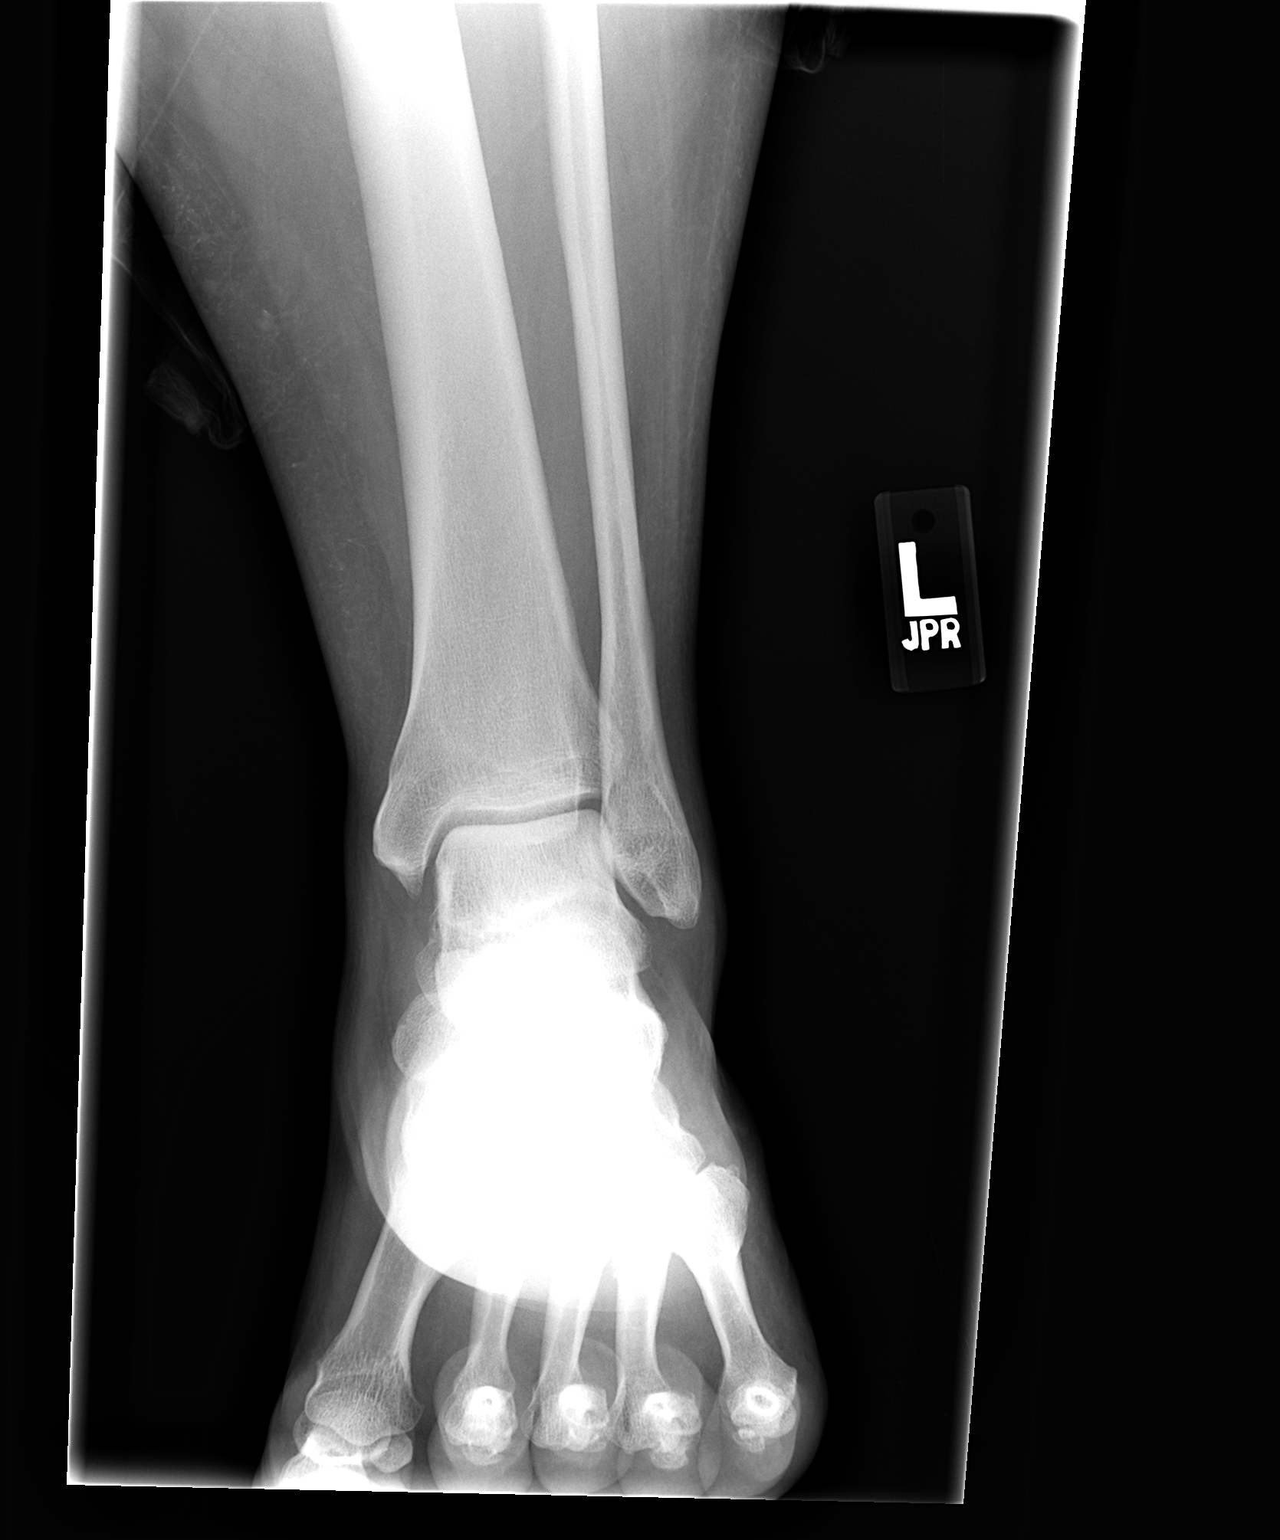

[view not recorded (2 of 3)]
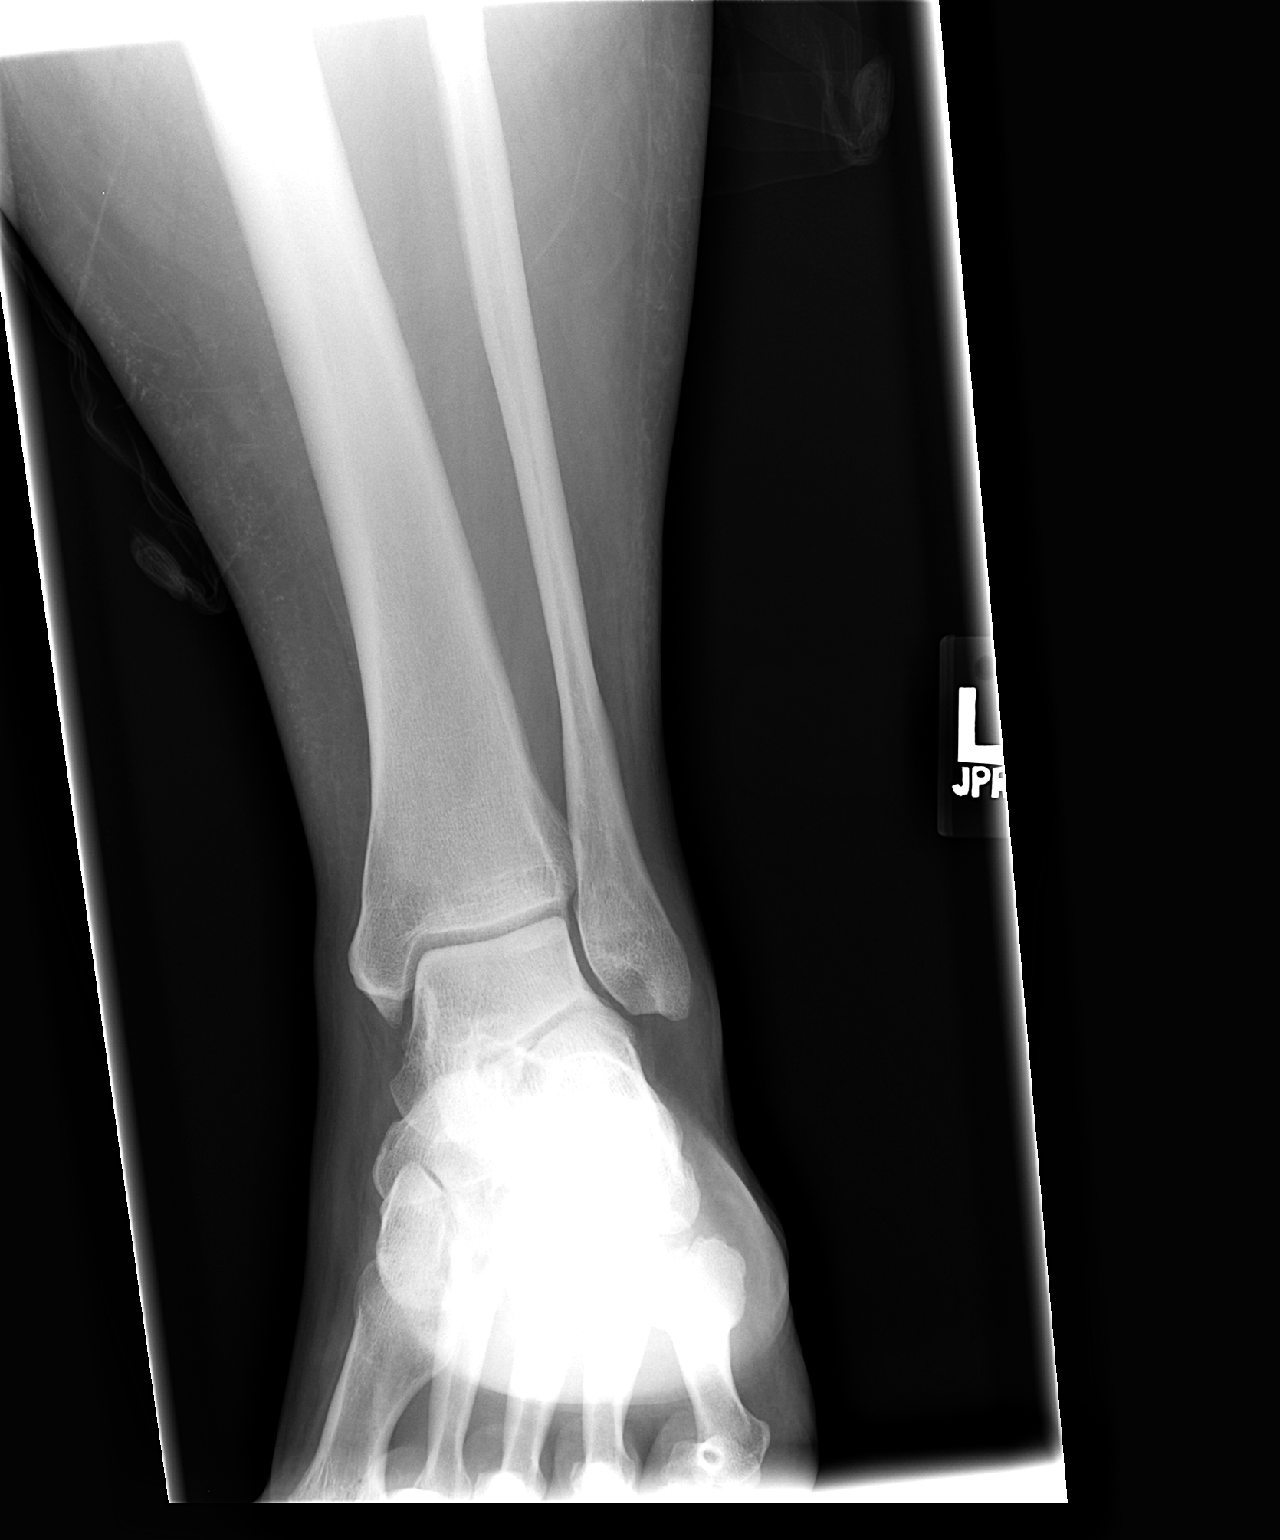

[view not recorded (3 of 3)]
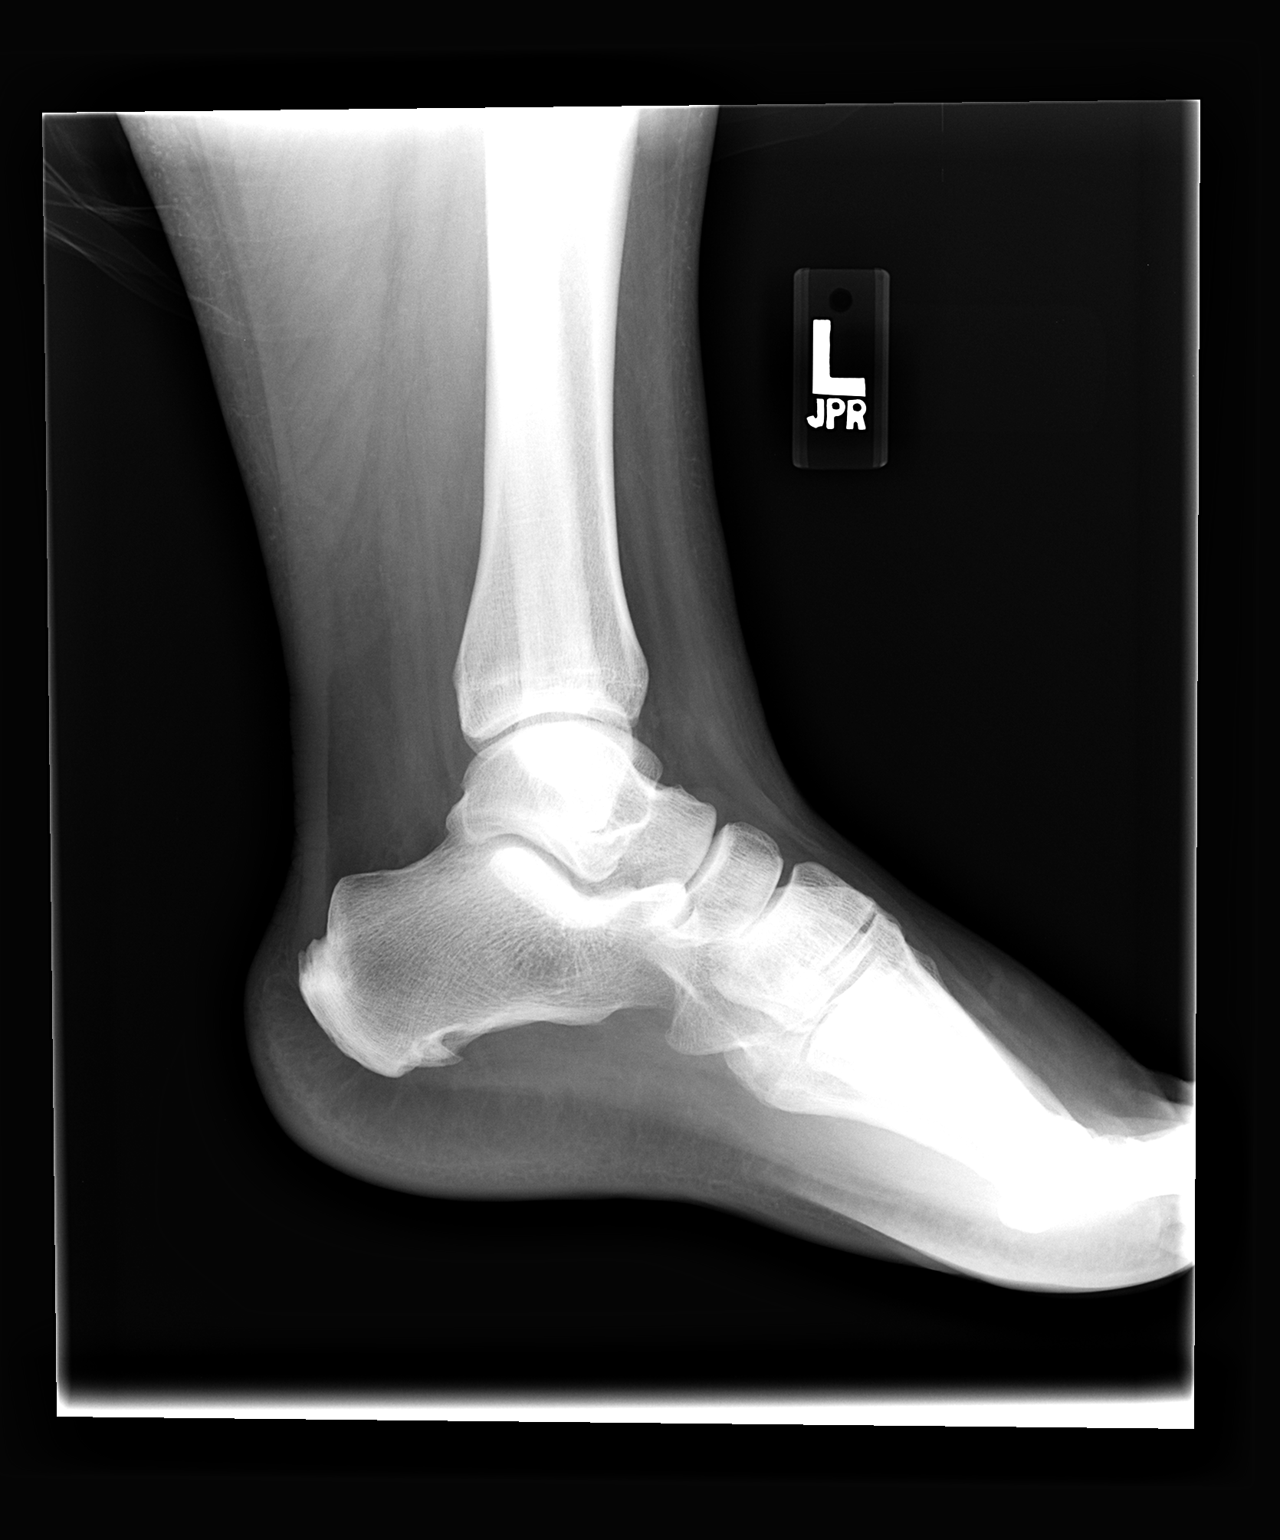

[3 of 3 positions shown; findings below may reference images not displayed]

IMPRESSION: No acute bony abnormalities.  Subtalar degenerative changes as above.

## 2006-07-05 ENCOUNTER — Emergency Department (HOSPITAL_COMMUNITY): Admission: EM | Admit: 2006-07-05 | Discharge: 2006-07-05 | Payer: Self-pay | Admitting: Emergency Medicine

## 2006-09-22 ENCOUNTER — Inpatient Hospital Stay (HOSPITAL_COMMUNITY): Admission: AD | Admit: 2006-09-22 | Discharge: 2006-09-22 | Payer: Self-pay | Admitting: Obstetrics and Gynecology

## 2006-10-21 IMAGING — US US OB TRANSVAGINAL MODIFY
1 series · 13 of 28 positions shown · non-contrast
Comparison: None.

CLINICAL INFORMATION: 29-year-old with unclear LMP because of their regular
menses, positive pregnancy test, presenting with lower pelvic pain.

EARLY OBSTETRICAL ULTRASOUND (LESS THAN 14 WEEKS) INCLUDING TRANSVAGINAL
EXAMINATION  09/11/2005:

[Series 1: unknown · 0.32mm/px · 13 of 55 slices shown]
[im 3/55]
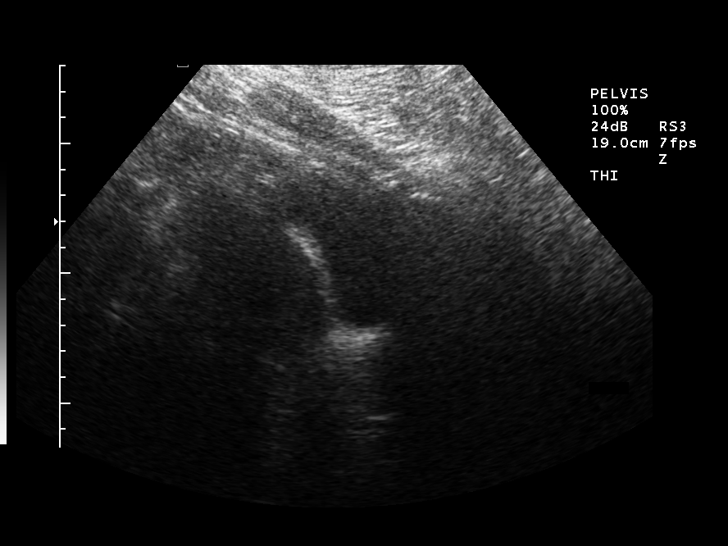
[im 7/55]
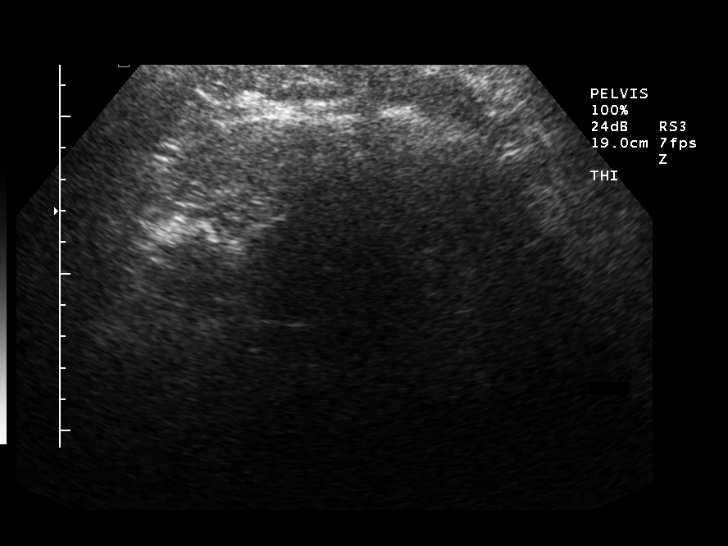
[im 11/55]
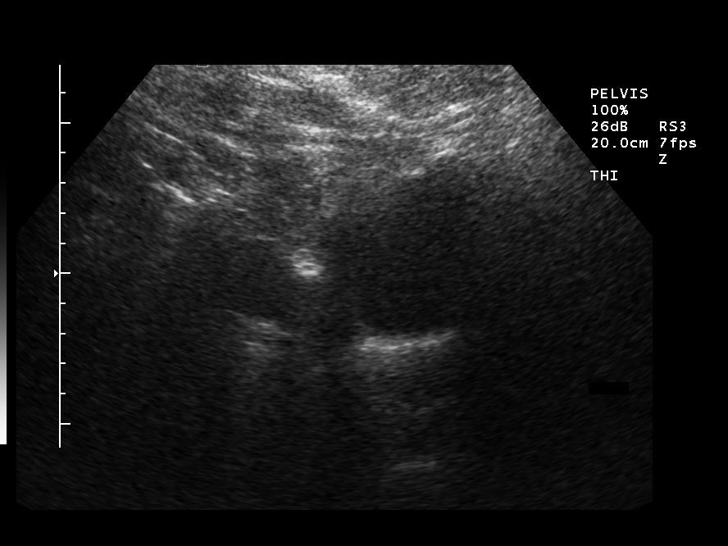
[im 15/55]
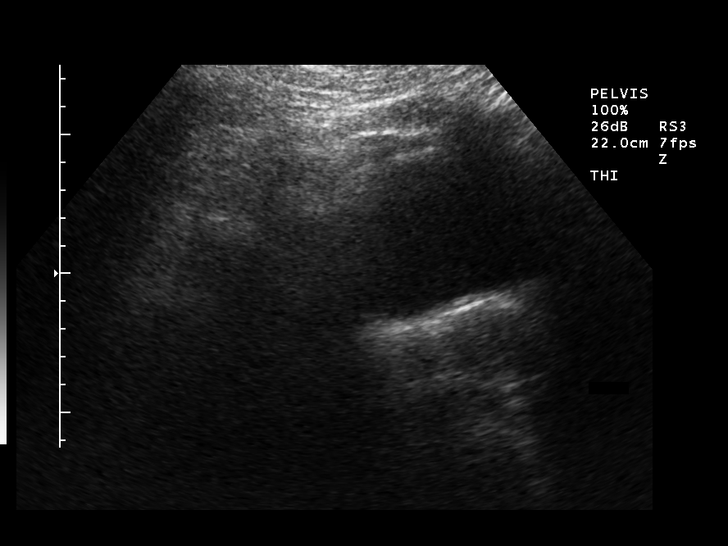
[im 19/55]
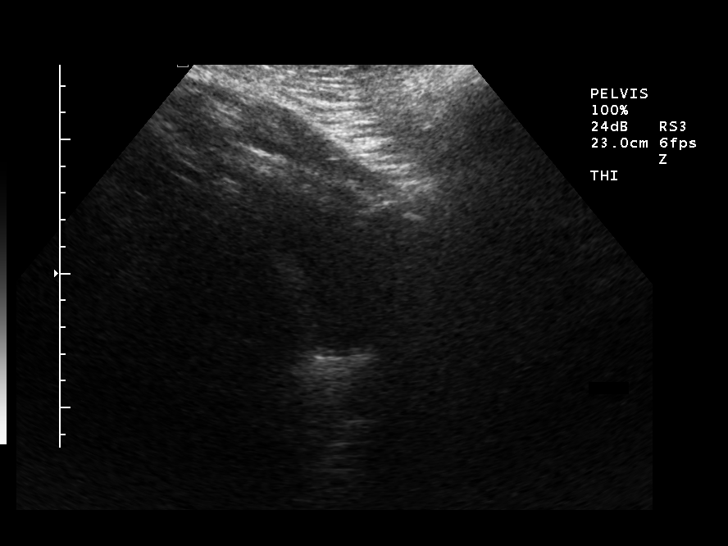
[im 23/55]
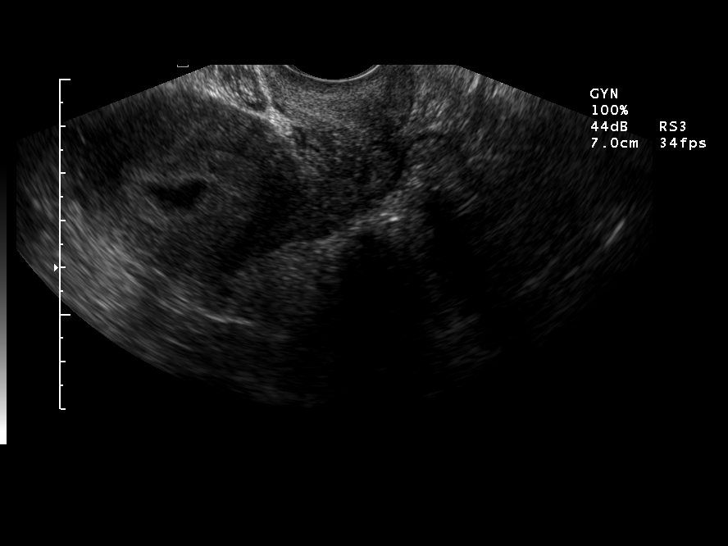
[im 29/55]
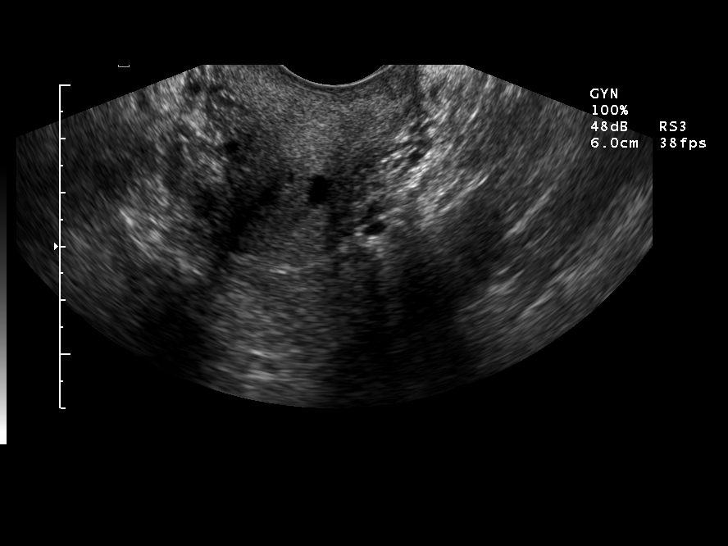
[im 33/55]
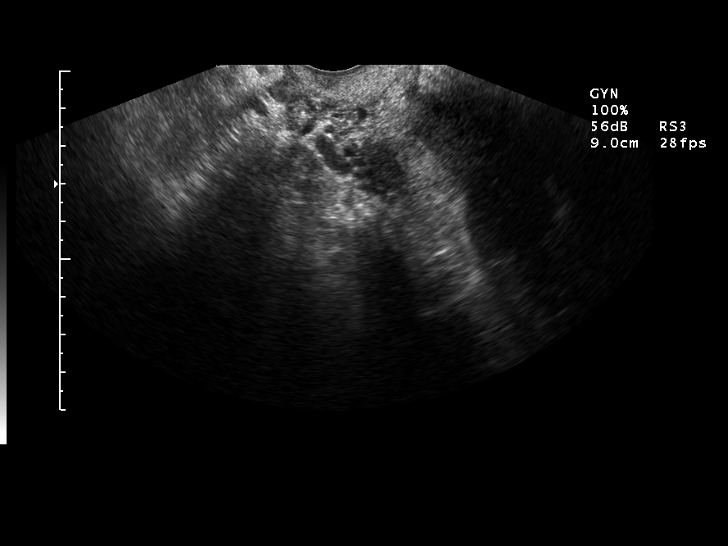
[im 37/55]
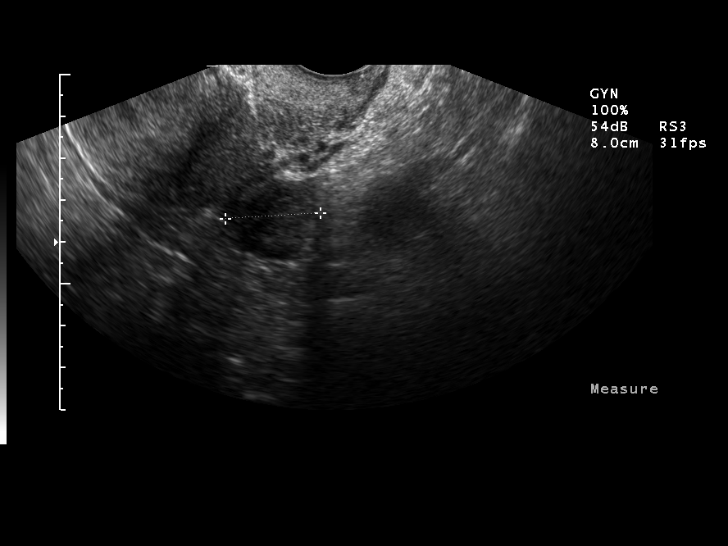
[im 41/55]
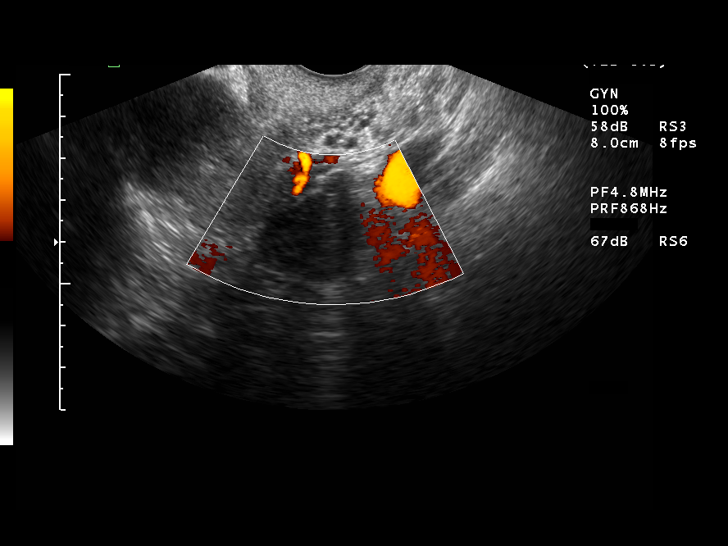
[im 45/55]
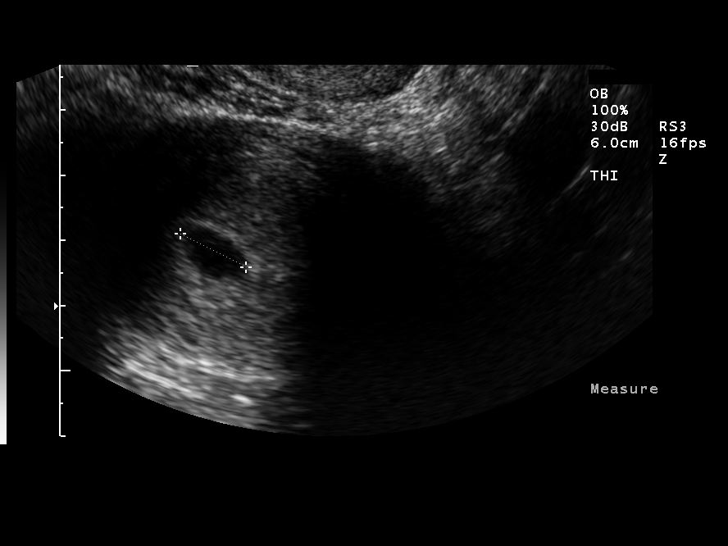
[im 49/55]
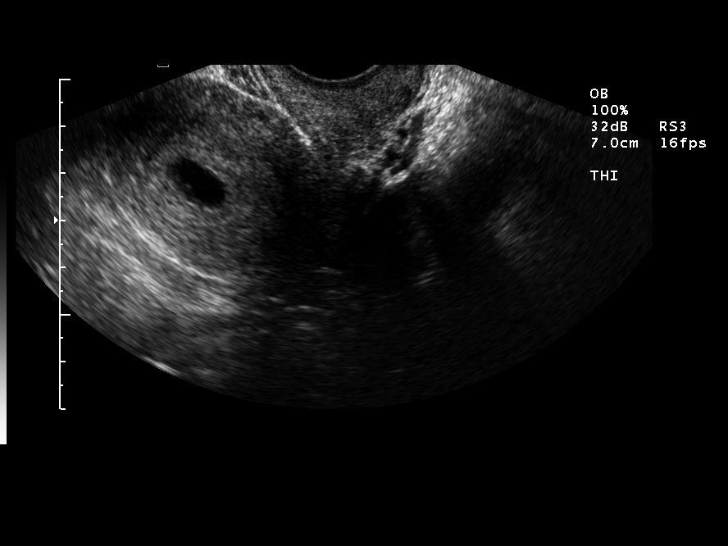
[im 53/55]
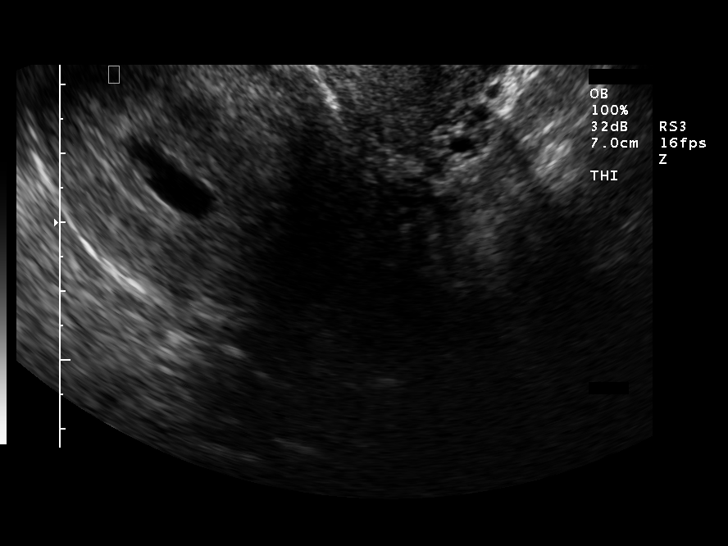

[13 of 28 positions shown; findings below may reference images not displayed]

FINDINGS: The examination is a very limited due to the patient's morbid
obesity. There is an intrauterine gestational sac with a mean sac diameter of
10.7 mm (5 weeks 6 days). A fetal pole is present with a crown-rump length of
3.1 mm (5 weeks 6 days). A yolk sac was identified. Fetal cardiac activity was
identified at real time by the ultrasound technologist, but the embryo is so
small that a recording could not be obtained. There is no evidence of
subchorionic hemorrhage.

The right ovary is normal in size and appearance and measures approximately
x 2.1 x 3.7 cm. The left ovary is normal in size and appearance and measures
approximately 3.0 x 2.2 x 2.3 cm. No adnexal masses or free fluid were
identified.
IMPRESSION: 1. Single living intrauterine embryo measuring 5 weeks 6 days gestational age by
mean sac diameter and crown-rump length. (EDC 05/08/2006).
2. No evidence of subchorionic hemorrhage.
3. Normal appearing ovaries. No adnexal masses or free fluid.

## 2006-10-26 IMAGING — US US OB TRANSVAGINAL
1 series · 14 of 25 positions shown · non-contrast
Comparison: none

CLINICAL DATA: Abdominal cramping with pain.  6 weeks estimated gestational age.  
TRANSVAGINAL OBSTETRICAL US:
TECHNIQUE: Transvaginal ultrasound was performed for evaluation of the gestation as well as the maternal uterus and adnexal regions.
Multiple images of the uterus and adnexa were obtained using an endovaginal approach.
There is a single intrauterine pregnancy identified that demonstrates an estimated gestational age by crown rump length of 6 weeks and 5 days.  Positive regular fetal cardiac activity with a rate of 124 bpm was noted.  A normal appearing yolk sac is seen and no signs of subchorionic hemorrhage are evident.
Both ovaries are seen with the right ovary measuring 3.4 x 1.8 x 1.8 cm and the left ovary measuring 2.6 x 1.8 x 1.8 cm.  No cul-de-sac or periovarian fluid is seen and no separate adnexal masses are noted.

[Series 1: us ob transvaginal · 0.19mm/px · 14 of 25 slices shown]
[im 1/25]
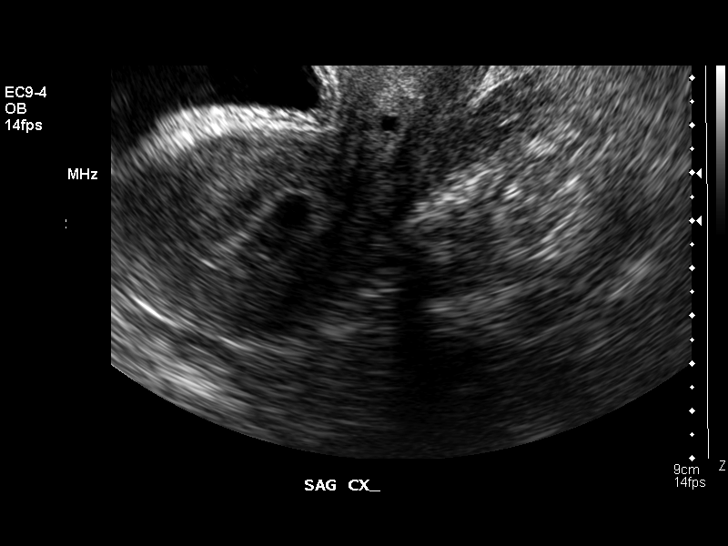
[im 3/25]
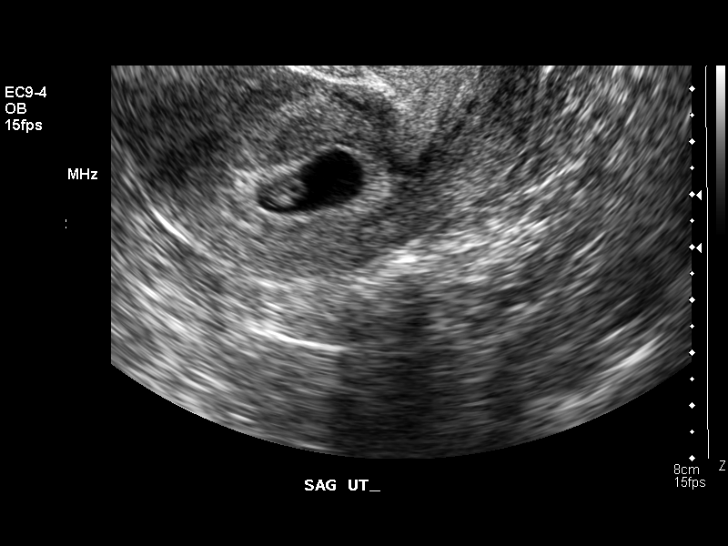
[im 5/25]
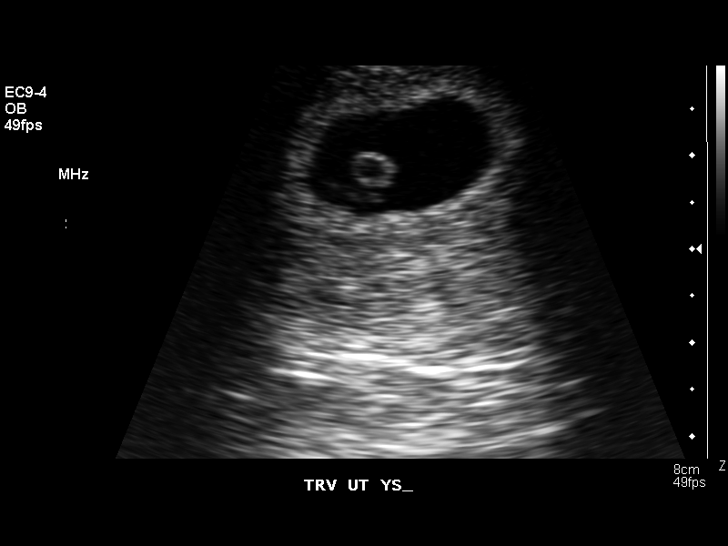
[im 7/25]
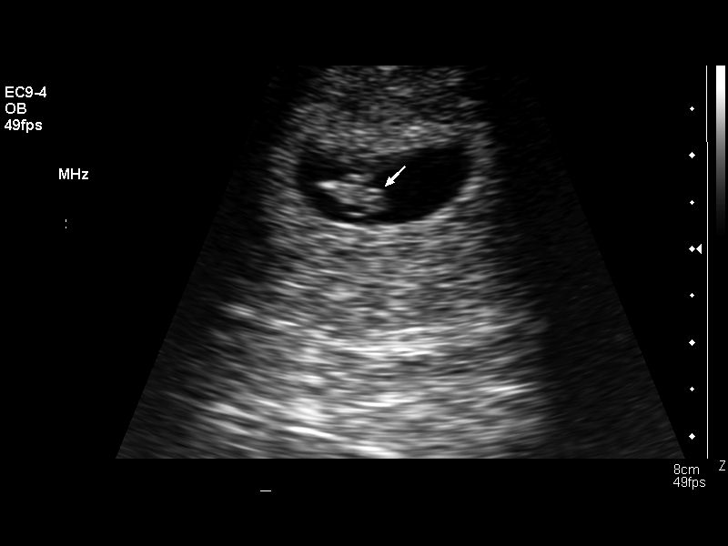
[im 9/25]
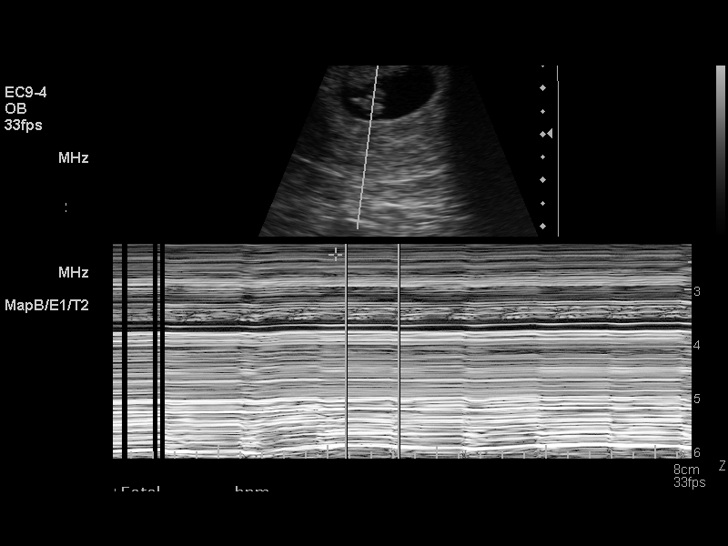
[im 10/25]
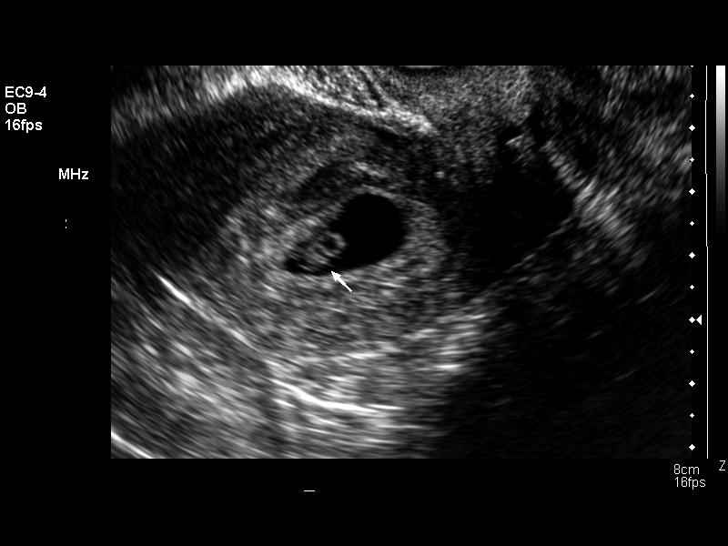
[im 12/25]
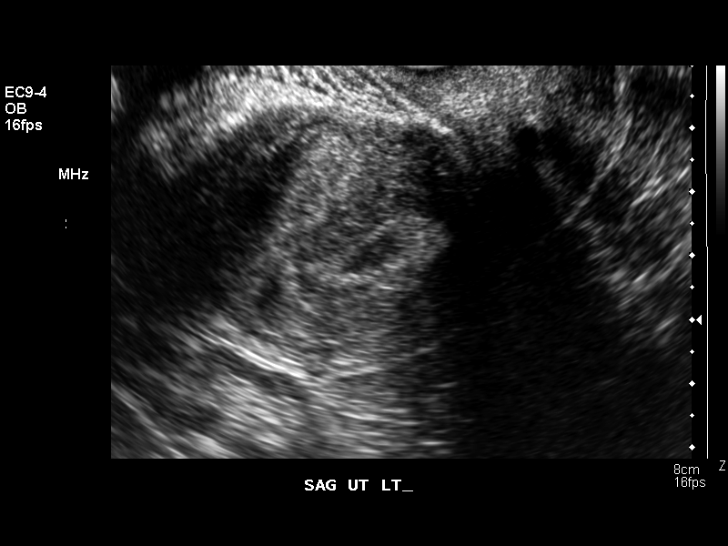
[im 14/25]
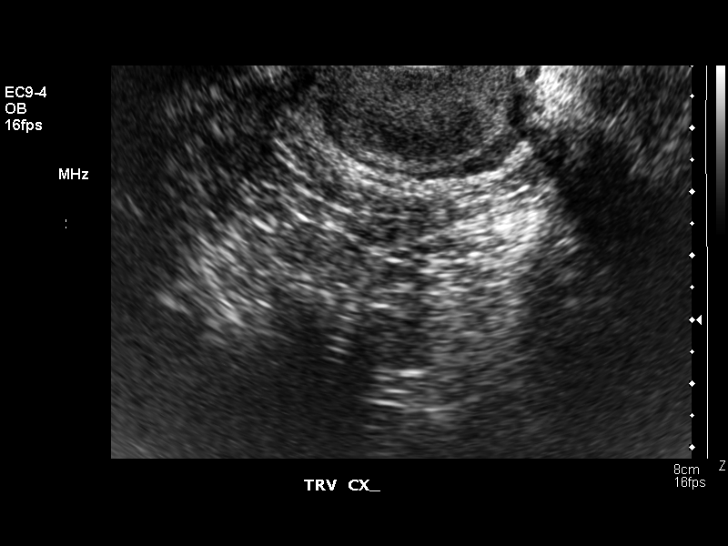
[im 16/25]
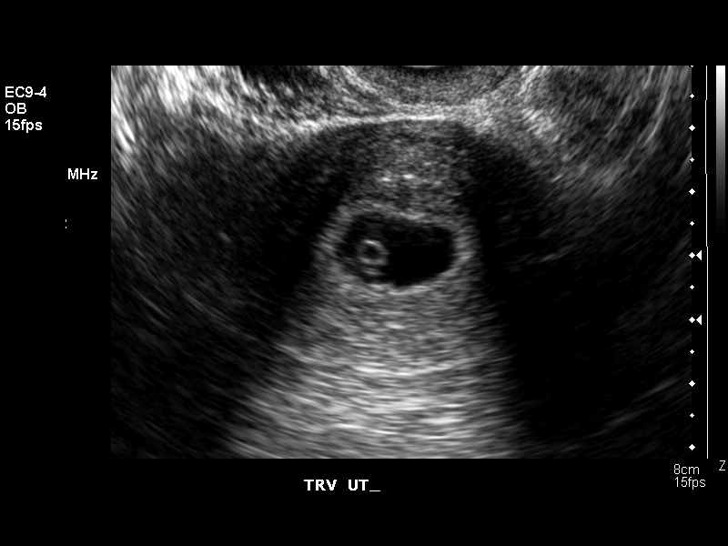
[im 17/25]
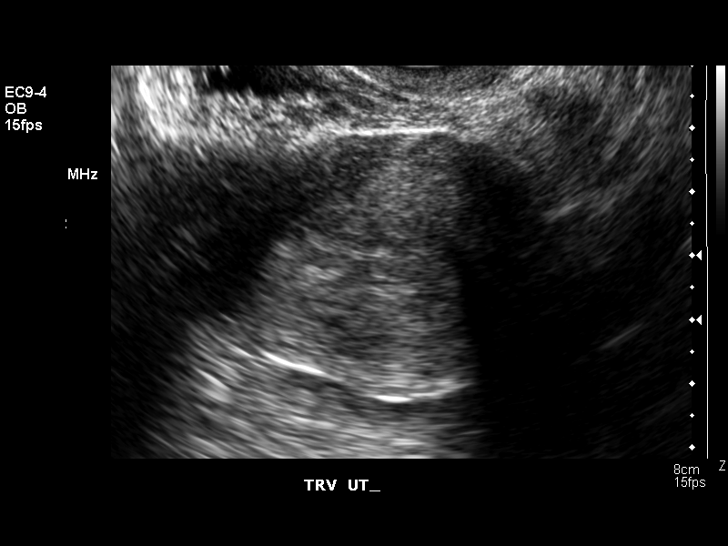
[im 19/25]
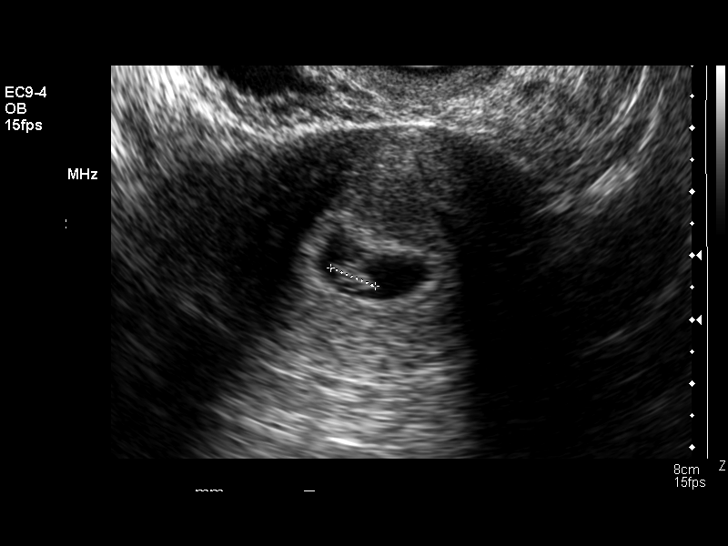
[im 21/25]
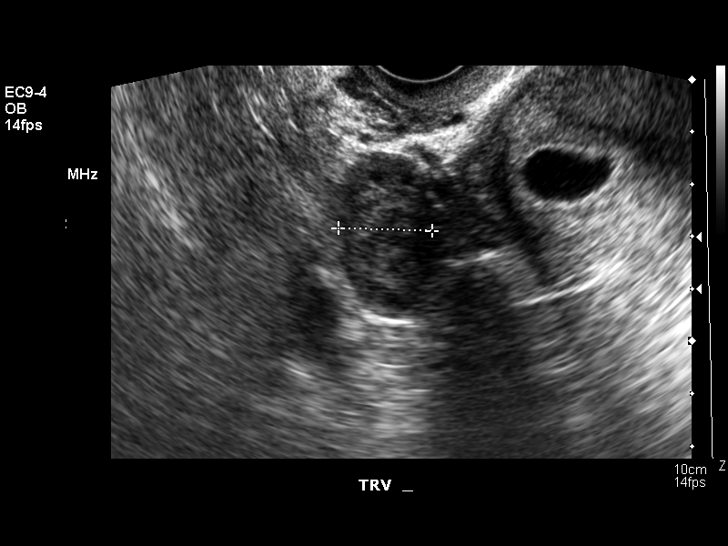
[im 23/25]
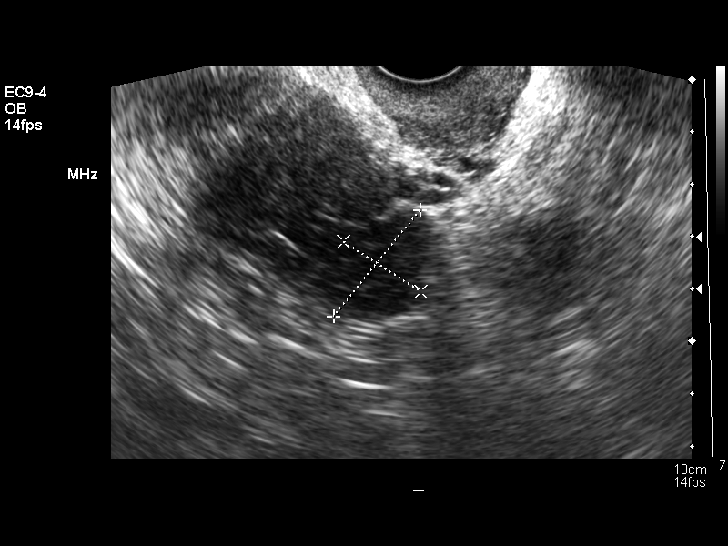
[im 25/25]
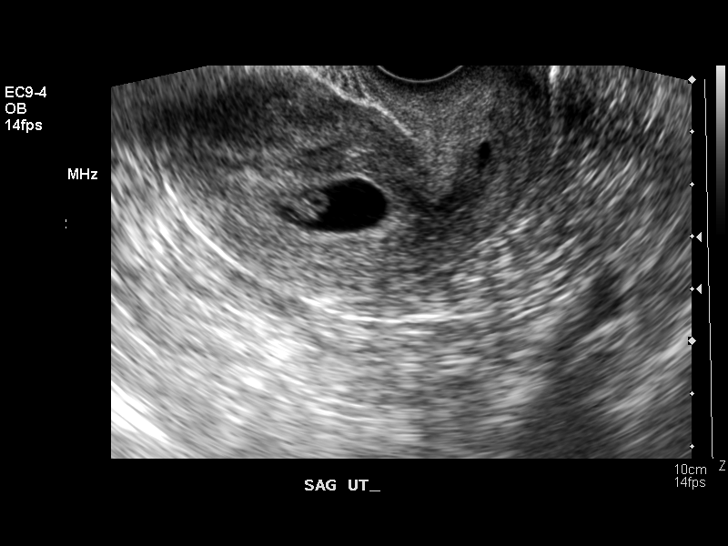

[14 of 25 positions shown; findings below may reference images not displayed]

IMPRESSION: 6 week 5 day living intrauterine pregnancy.  Normal ovaries.

## 2006-11-08 IMAGING — CR DG CHEST 2V
2 series · 2 of 2 positions shown · non-contrast
Comparison: 05/16/2005.

CLINICAL DATA: Dyspnea and chest pain. Patient 8 weeks pregnant.    
 CHEST - 2 VIEW:

[w chest pa *]
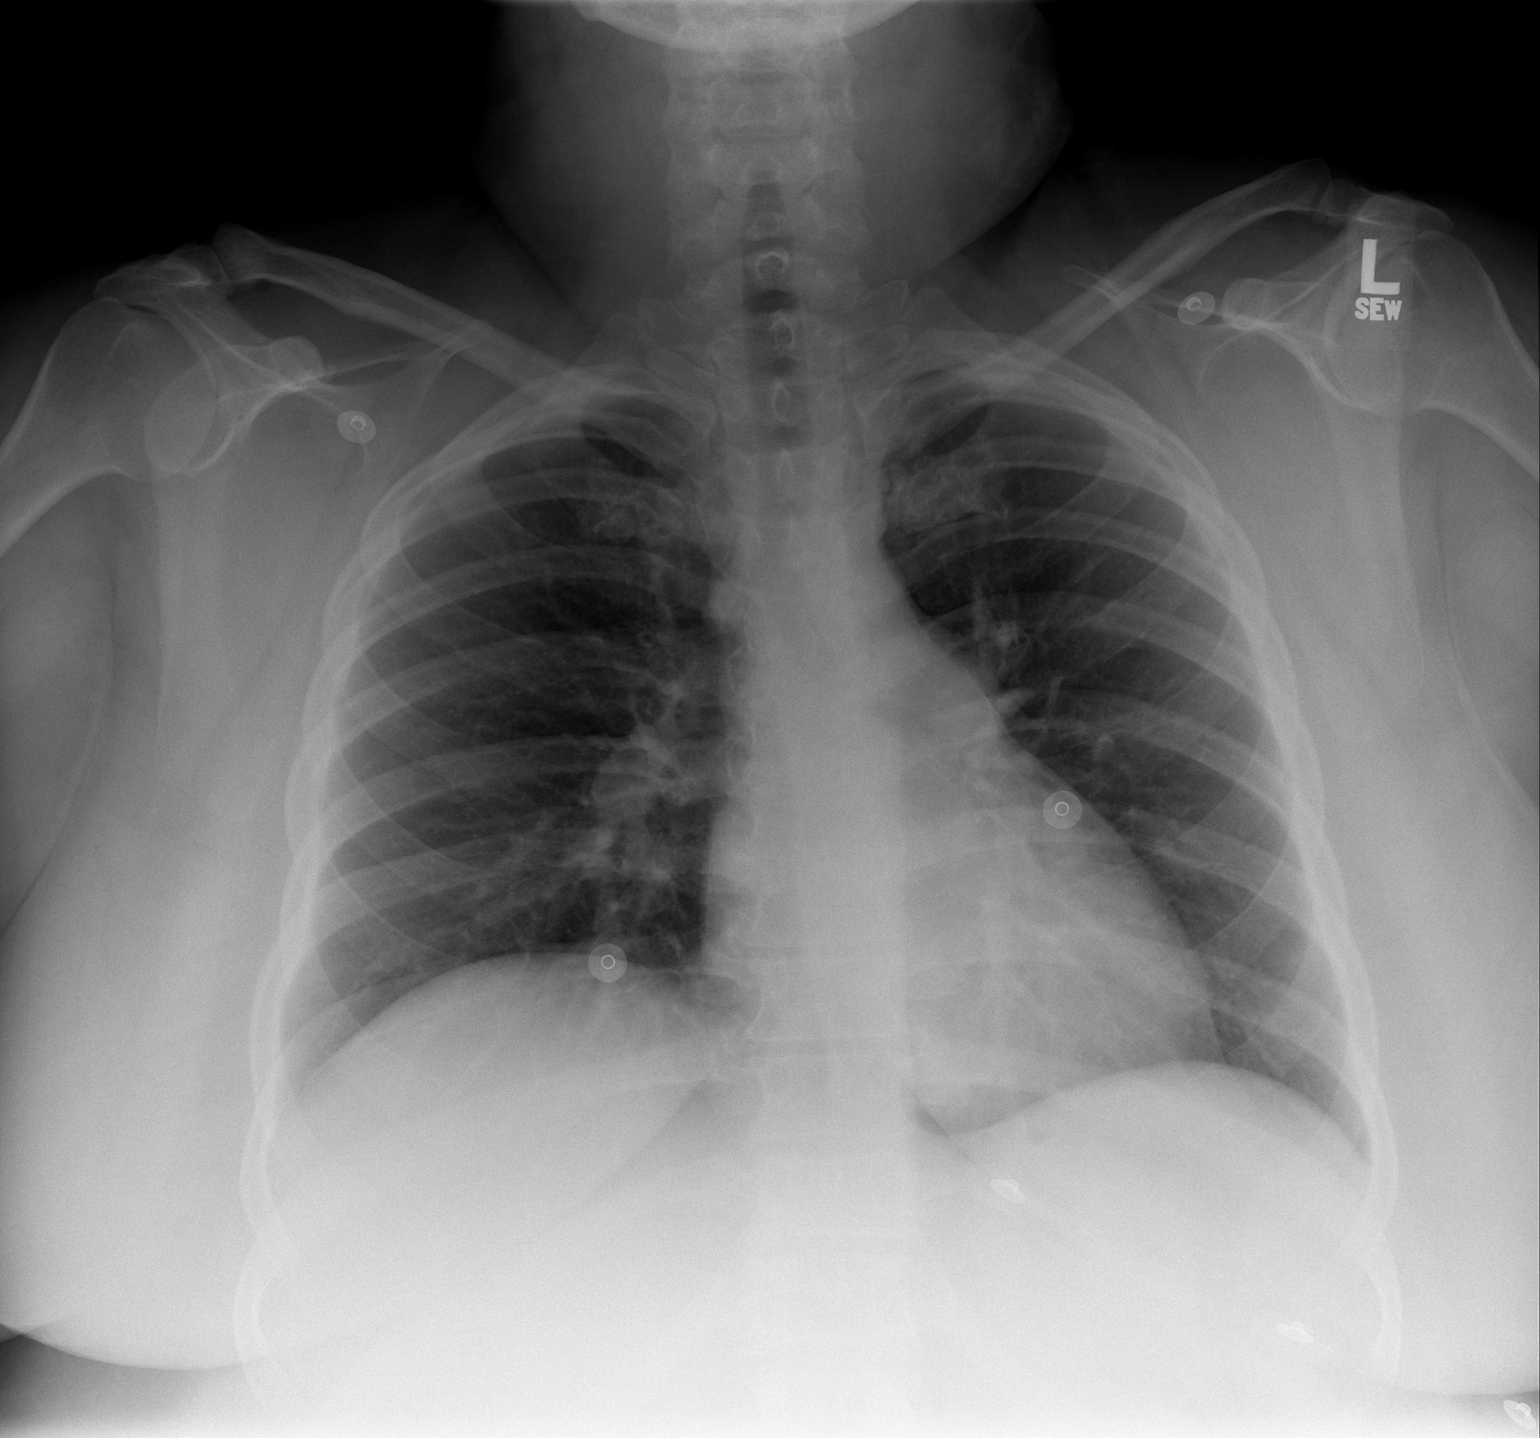

[w chest lat *]
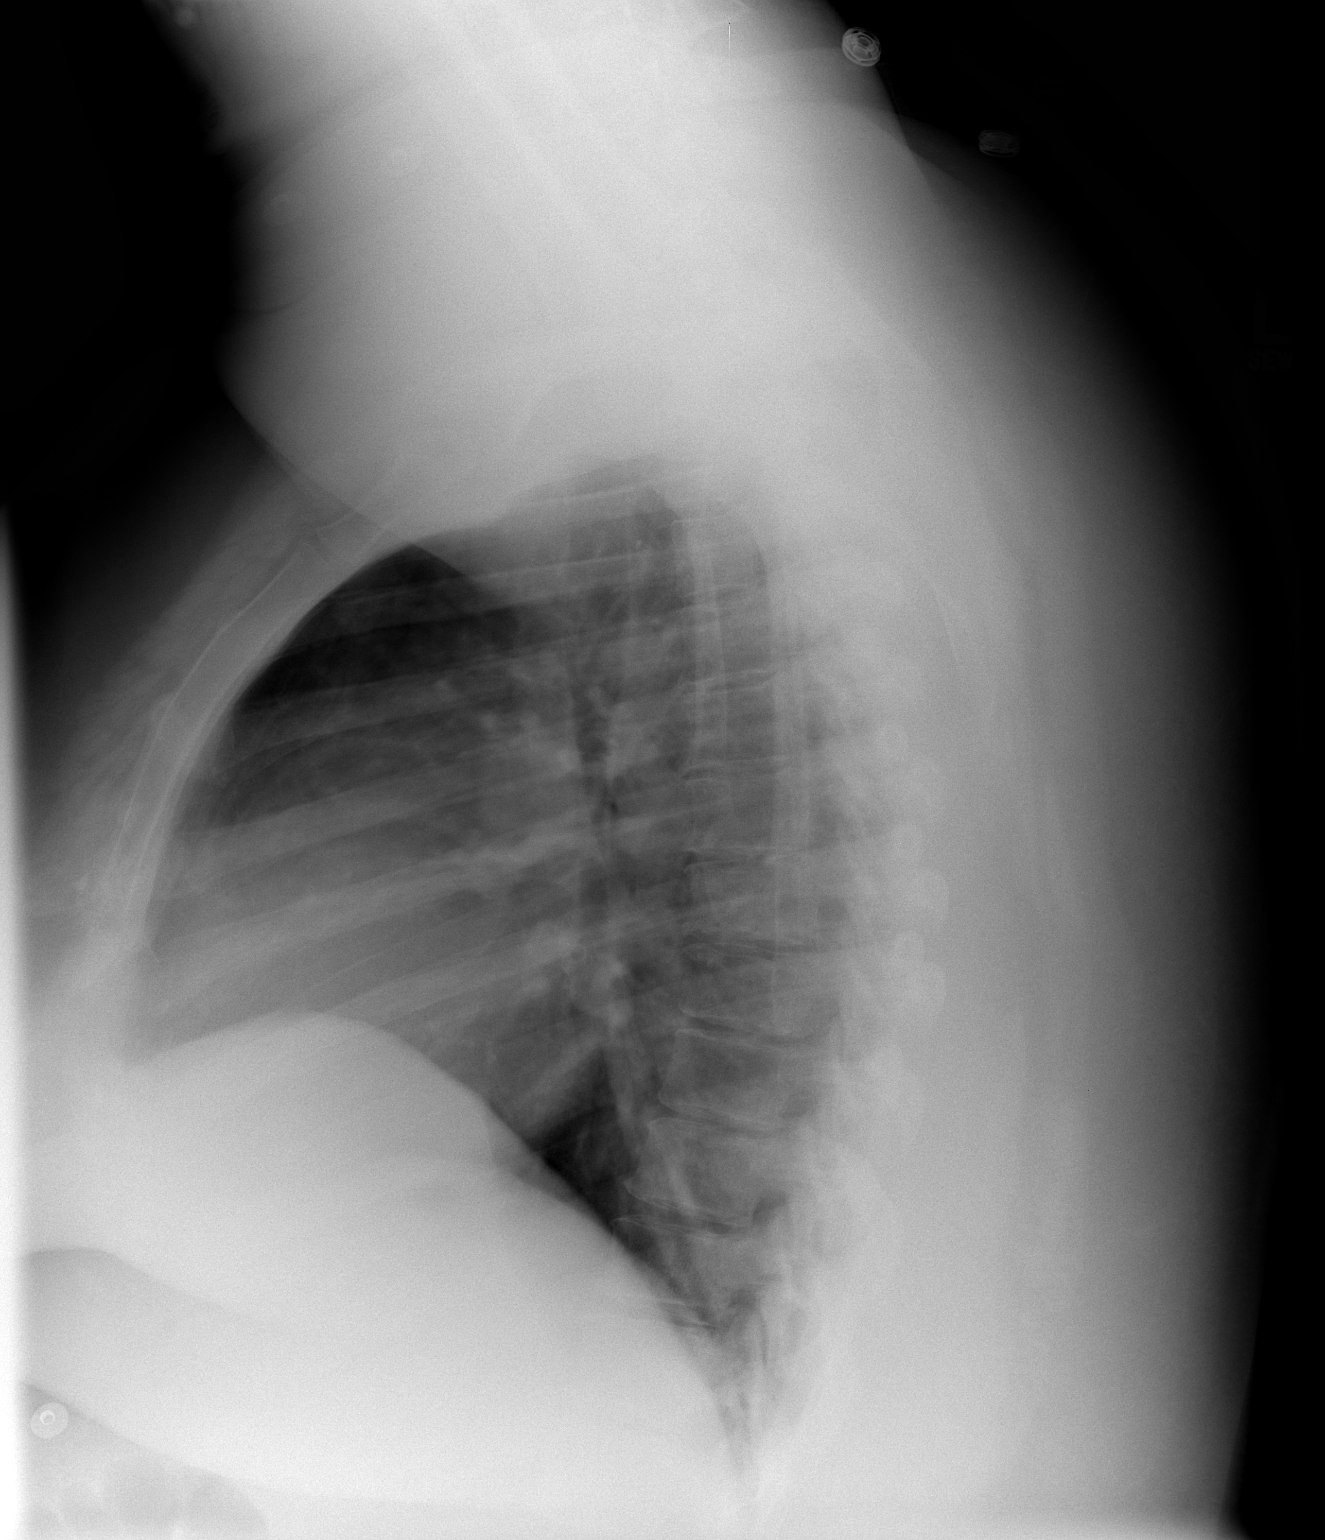

[2 of 2 positions shown; findings below may reference images not displayed]

Mild elevation of the right hemidiaphragm and mild peribronchial thickening is stable.  Cardiomediastinal silhouette is unchanged.  The lungs are otherwise clear.  The patient was shielded for this examination.
IMPRESSION: No evidence of acute cardiopulmonary disease.

## 2006-11-12 IMAGING — CR DG WRIST COMPLETE 3+V*R*
3 series · 3 of 3 positions shown · non-contrast
Comparison: none

CLINICAL DATA: Fall. 
RIGHT WRIST - 3 VIEW:
Patient shielded as patient is pregnant.

[view not recorded (1 of 3)]
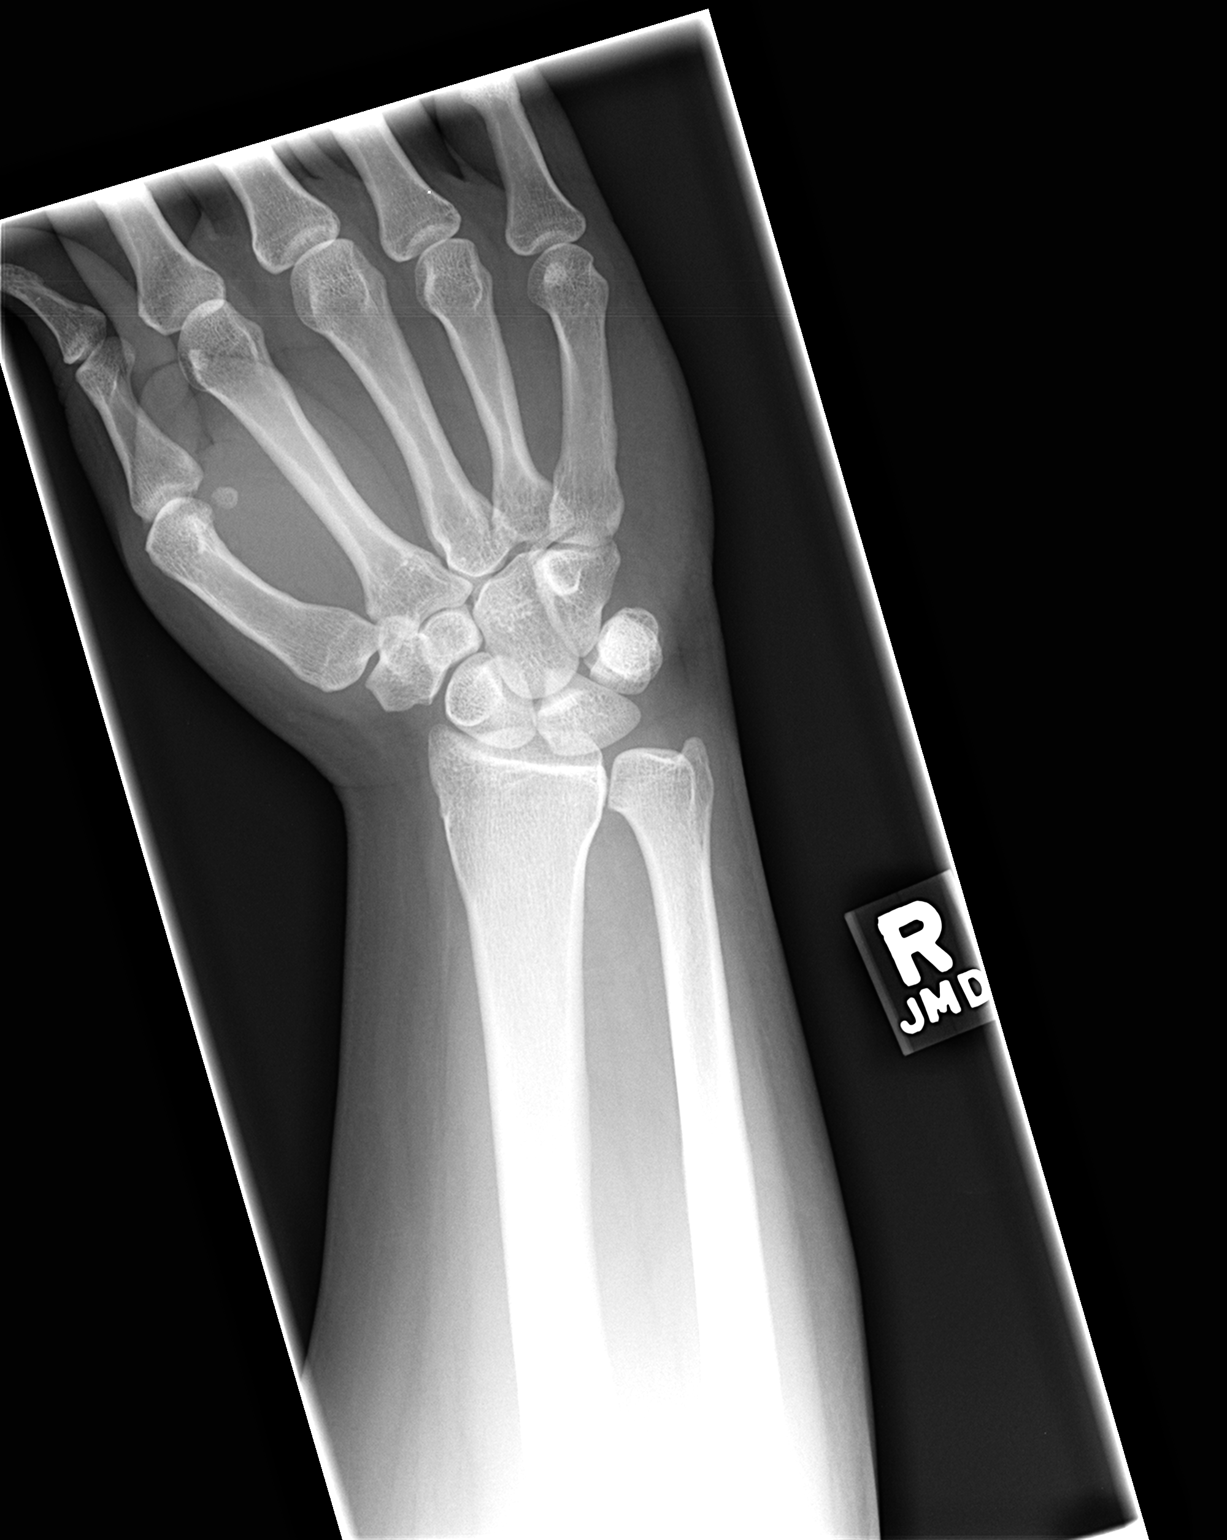

[view not recorded (2 of 3)]
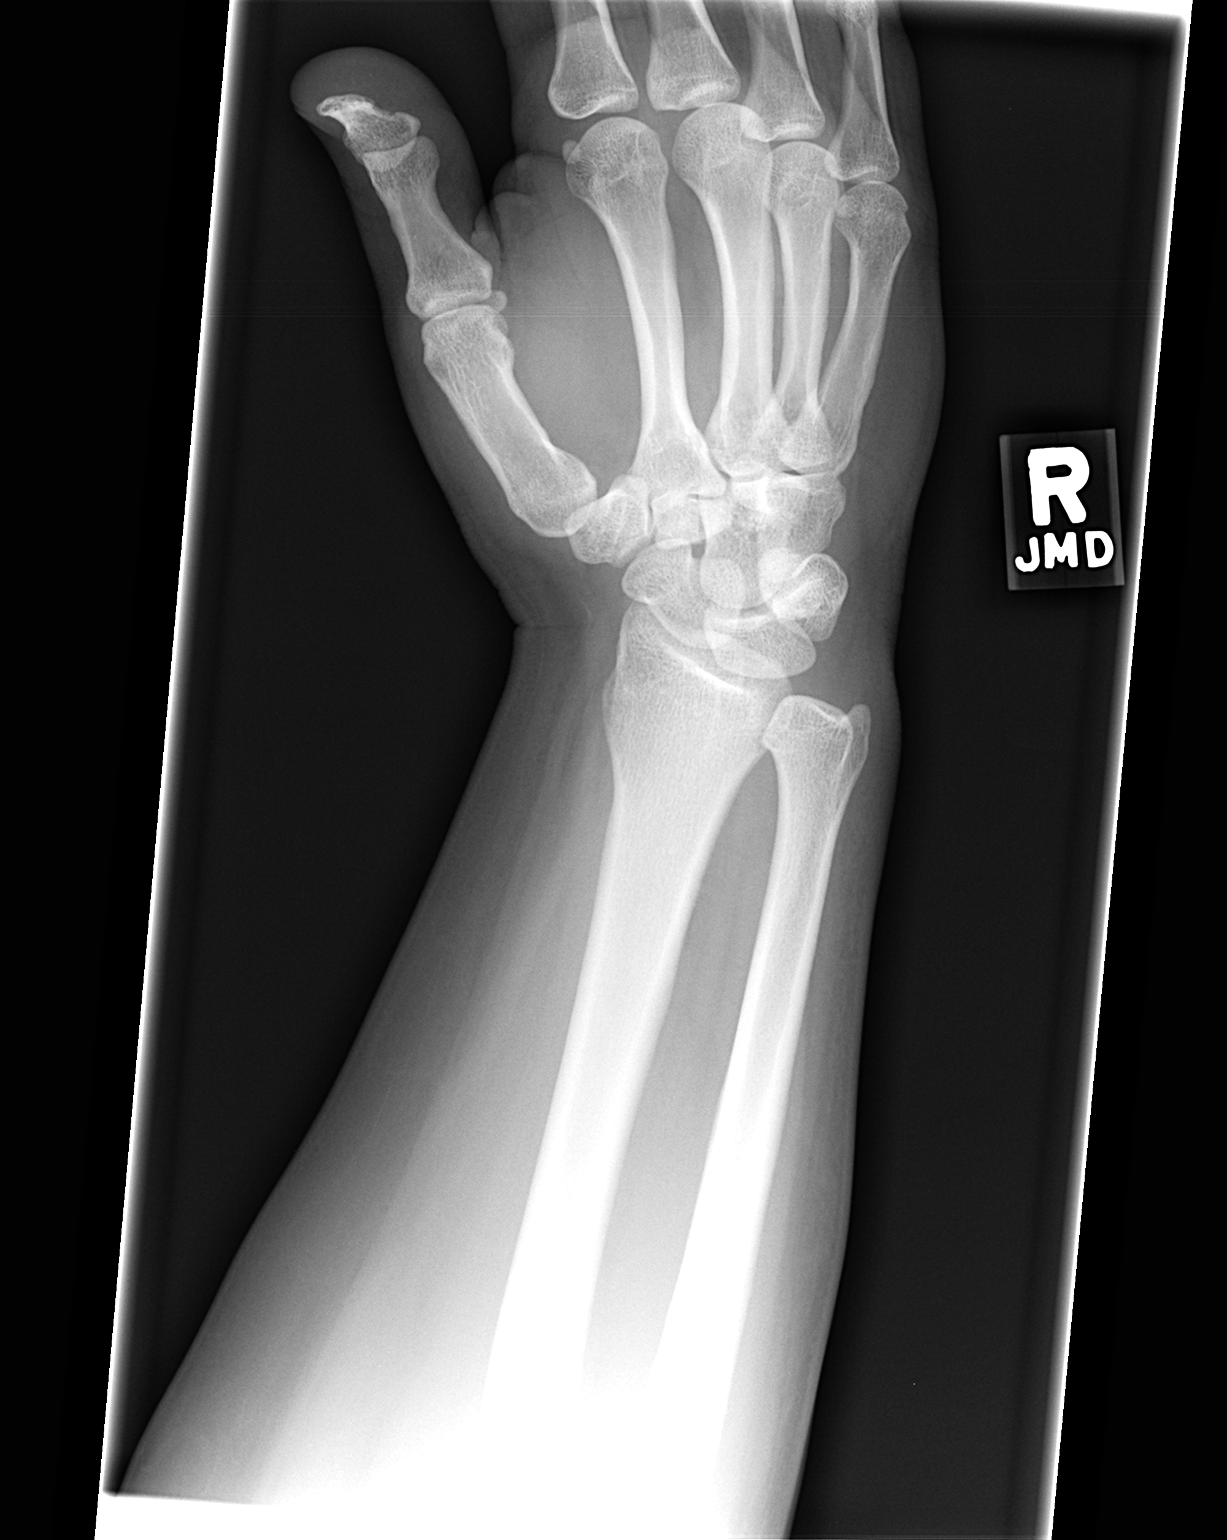

[view not recorded (3 of 3)]
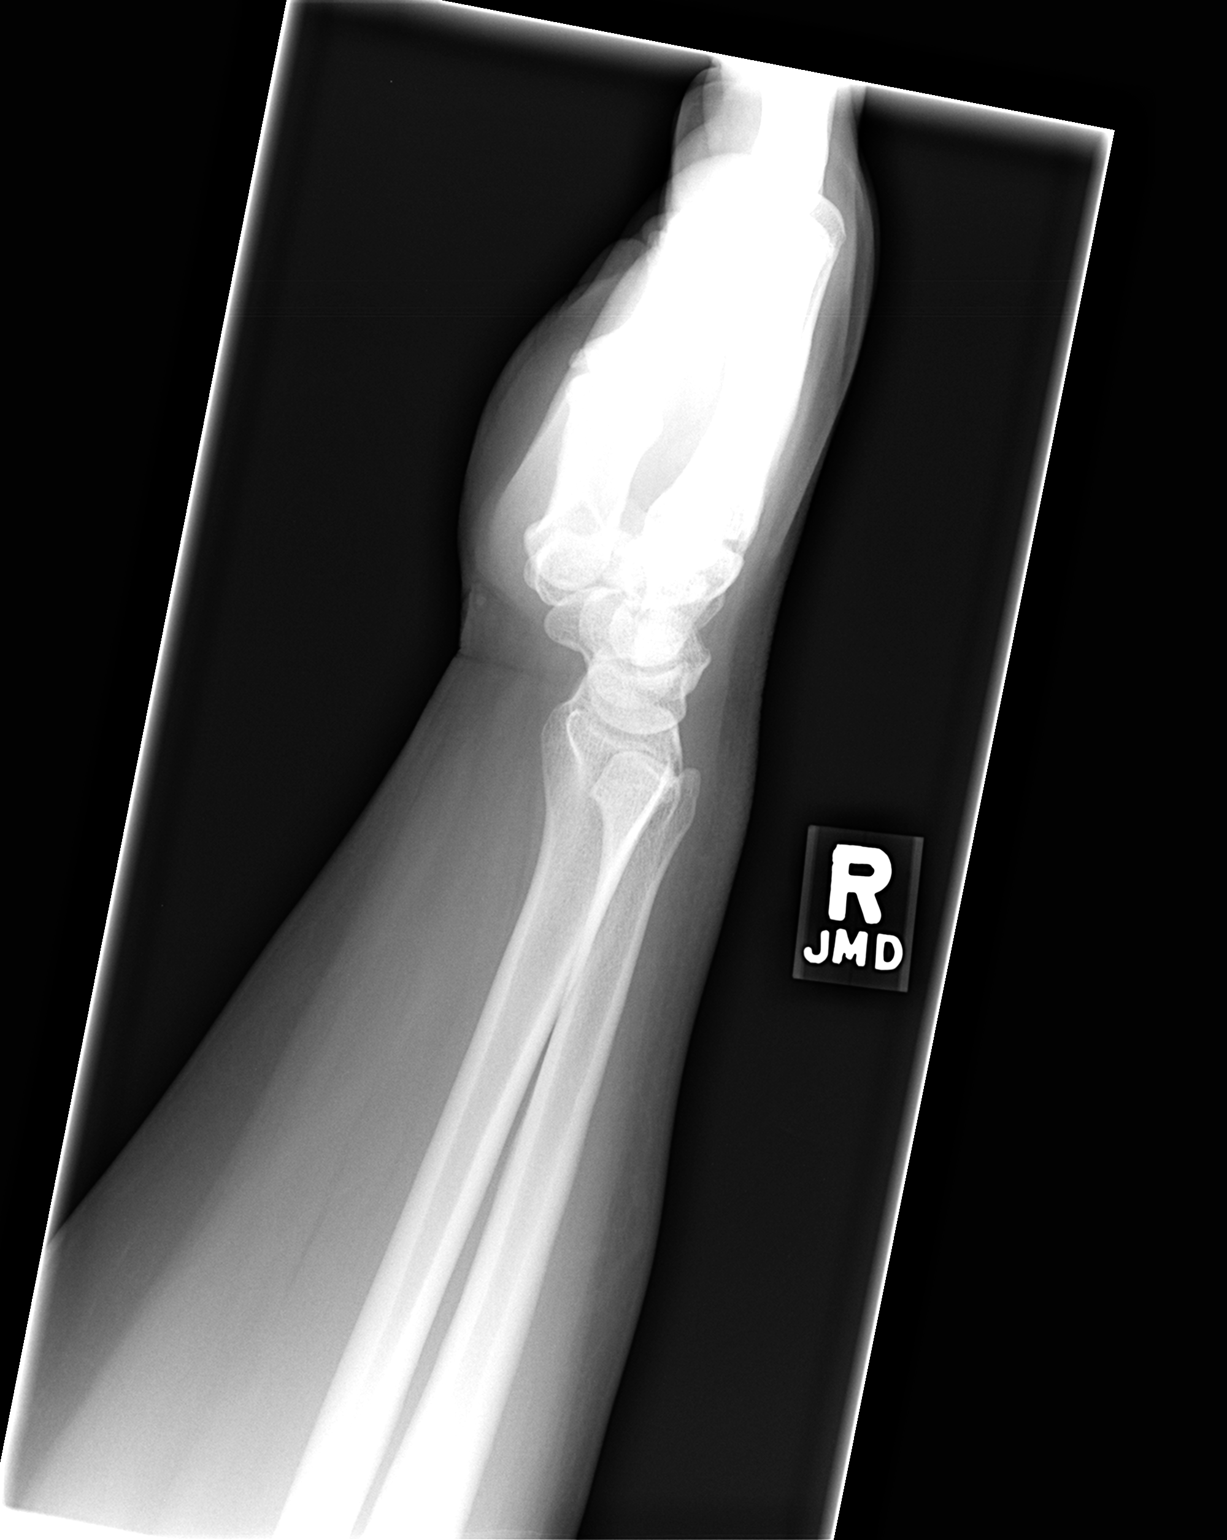

[3 of 3 positions shown; findings below may reference images not displayed]

FINDINGS: No evidence of fracture or dislocation.  If there were any persistent anatomical snuffbox tenderness indicating scaphoid injury, follow-up imaging may be necessary.
IMPRESSION: No fracture with followup as noted above.

## 2006-11-12 IMAGING — CT CT HEAD W/O CM
3 of 7 series · 10 of 40 positions shown, 12 images · IV contrast (omnipaque)
Comparison: none

CLINICAL DATA: Fall with headache.  
HEAD CT WITHOUT CONTRAST:
TECHNIQUE: Patient is pregnant.  Risk and benefit of the procedure were reviewed with the patient by Dr. Quinonez. Patient agreed to the exam with consent obtained.   Patient  was shielded.  
Contiguous axial images were obtained from the base of the skull through the vertex according to standard protocol without contrast.
TECHNIQUE: Multidetector CT imaging of the cervical spine was performed.  Multiplanar CT image reconstructions were also generated.
TECHNIQUE: Multidetector CT imaging of the chest was performed with pulmonary embolus protocol during bolus injection of intravenous contrast.  Multiplanar CT angiographic image reconstructions were generated to evaluate the vascular anatomy.
Contrast:  80 cc Omnipaque 300

[Series 13: pe 1.0 b20f st · axial · 0.68mm/px · z∈[-484,-317]mm · 5 of 251 slices shown, 7 images]
[im 42/251  brain]
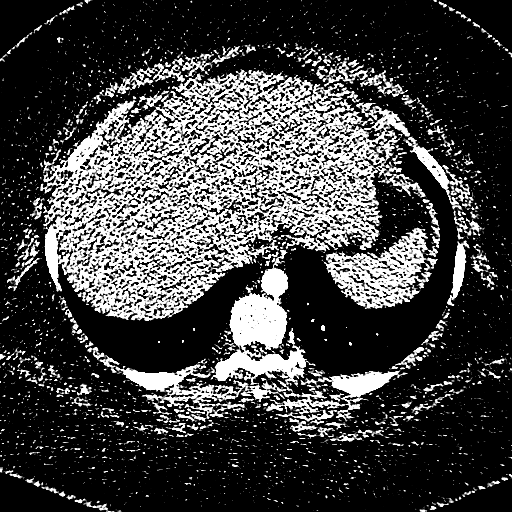
[im 42/251  bone]
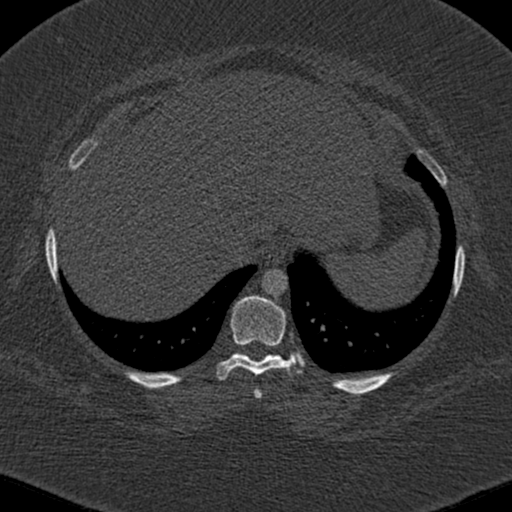
[im 84/251  brain]
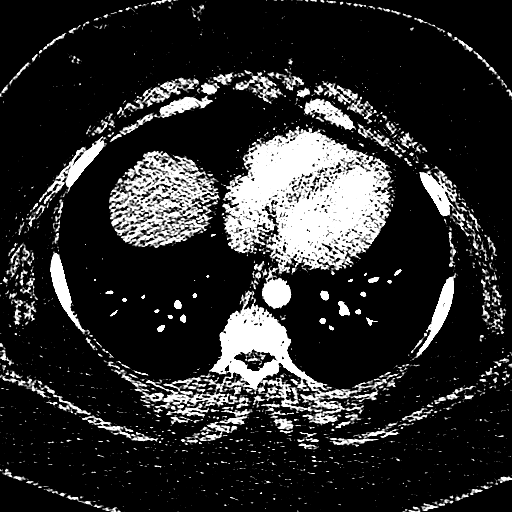
[im 126/251  brain]
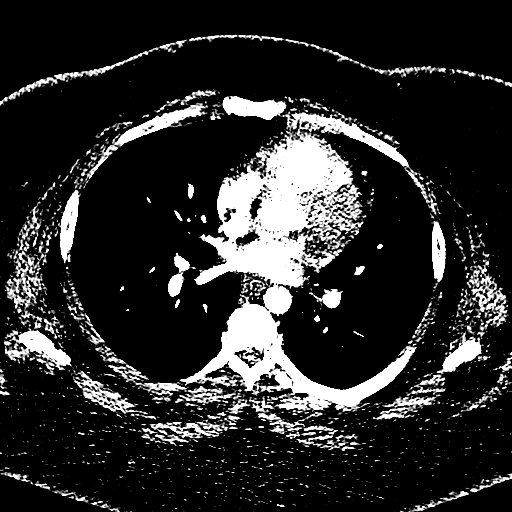
[im 167/251  brain]
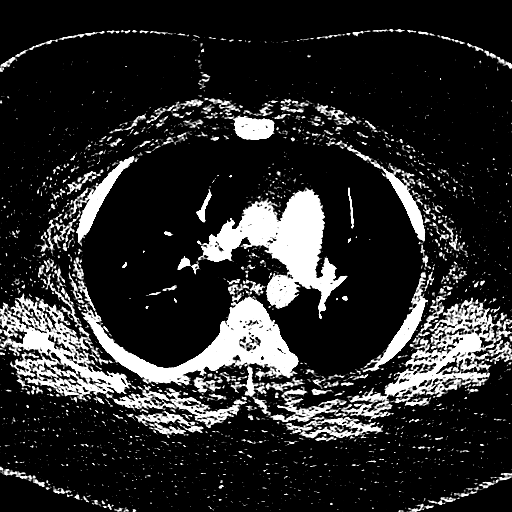
[im 209/251  brain]
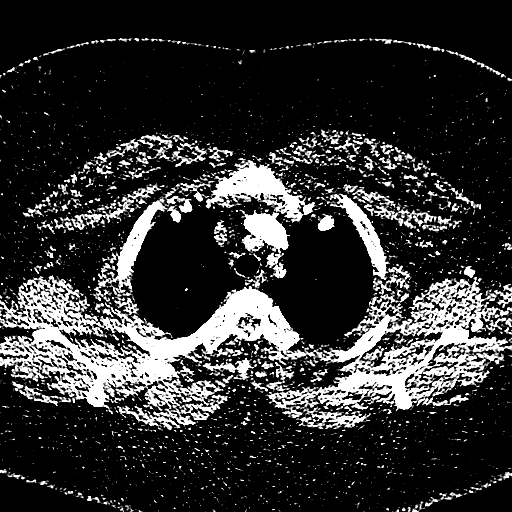
[im 209/251  bone]
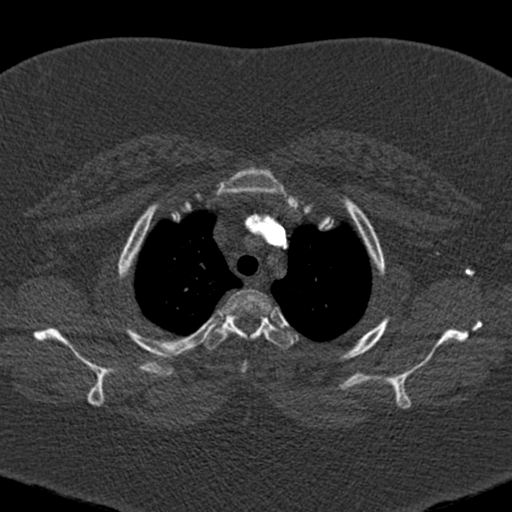

[Series 602: coronal cervical · coronal · 0.32mm/px · 3 of 34 slices shown]
[im 10/34  brain]
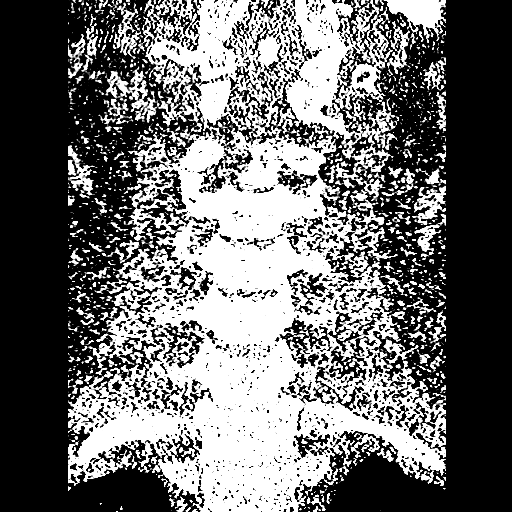
[im 15/34  brain]
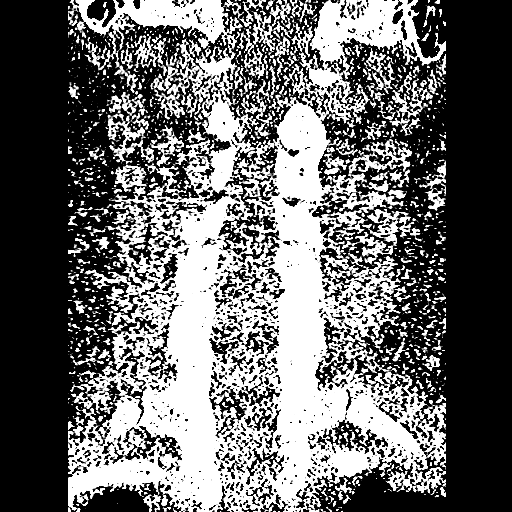
[im 19/34  brain]
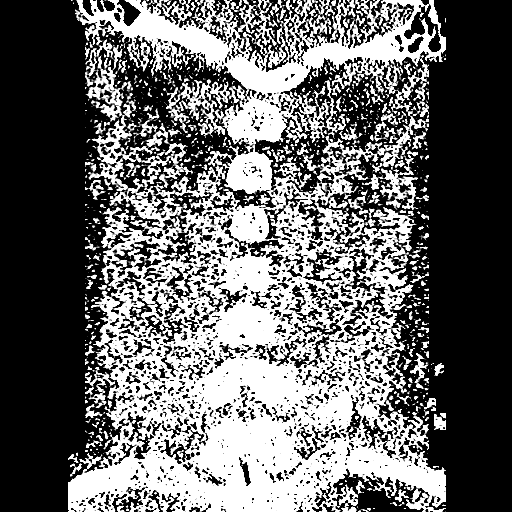

[Series 607: radial flip · axial · 0.68mm/px · z∈[-384,-314]mm · 2 of 40 slices shown]
[im 19/40  brain]
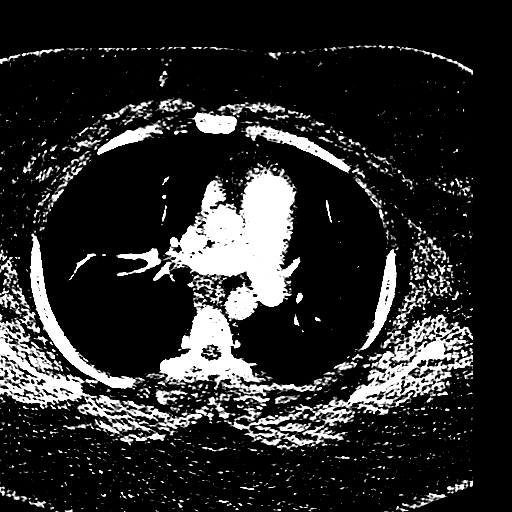
[im 24/40  brain]
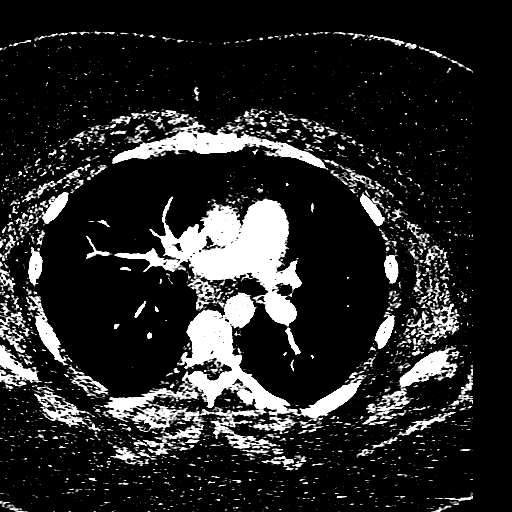

[10 of 40 positions shown; findings below may reference images not displayed]

FINDINGS: No evidence of hydrocephalus, midline shift, or intracranial hemorrhage.  No skull fracture.  Minimal mucosal thickening ethmoid sinuses and sphenoid sinus air cells.
IMPRESSION: 1.  No evidence of fracture or intracranial hemorrhage. 
2.  Minimal paranasal sinus mucosal thickening, ethmoid sinus air cells and sphenoid sinus air cells.  
CERVICAL SPINE CT WITHOUT CONTRAST:
FINDINGS: The present exam is limited by patient?s habitus and lack of penetration.  This particularly limits evaluation from the C-5 level and inferiorly.  Taking this moderate limitation into account, no obvious fracture or malalignment is identified.
IMPRESSION: Limited exam because of the patient?s habitus without obvious fracture or malalignment.  
CT ANGIOGRAPHY OF CHEST:
FINDINGS: Limited exam secondary to the patient?s habitus without obvious pulmonary embolus, aortic dissection.  No pericardial effusion.  Slightly enlarged preaortic lymph node measuring 1.3 x 0.9 cm.  Trachea and mainstem bronchi patent.  Minimal nodularity peripheral aspect right upper lung zone may represent a mild pneumonitis without segmental area of consolidation.
IMPRESSION: 1.  Limited exam without obvious pulmonary embolus. 
2.  Minimal nodularity right upper lung zone may represent mild pneumonitis-type changes.  
3.  Top normal to minimally enlarged preaortic lymph node of questionable significance.

## 2006-11-12 IMAGING — CR DG CHEST 1V
1 series · 1 of 1 positions shown · non-contrast
Comparison: 09/29/05.

CLINICAL DATA: 9 weeks pregnant with chest pain.  Fall.
 CHEST - 1 VIEW:

[view not recorded]
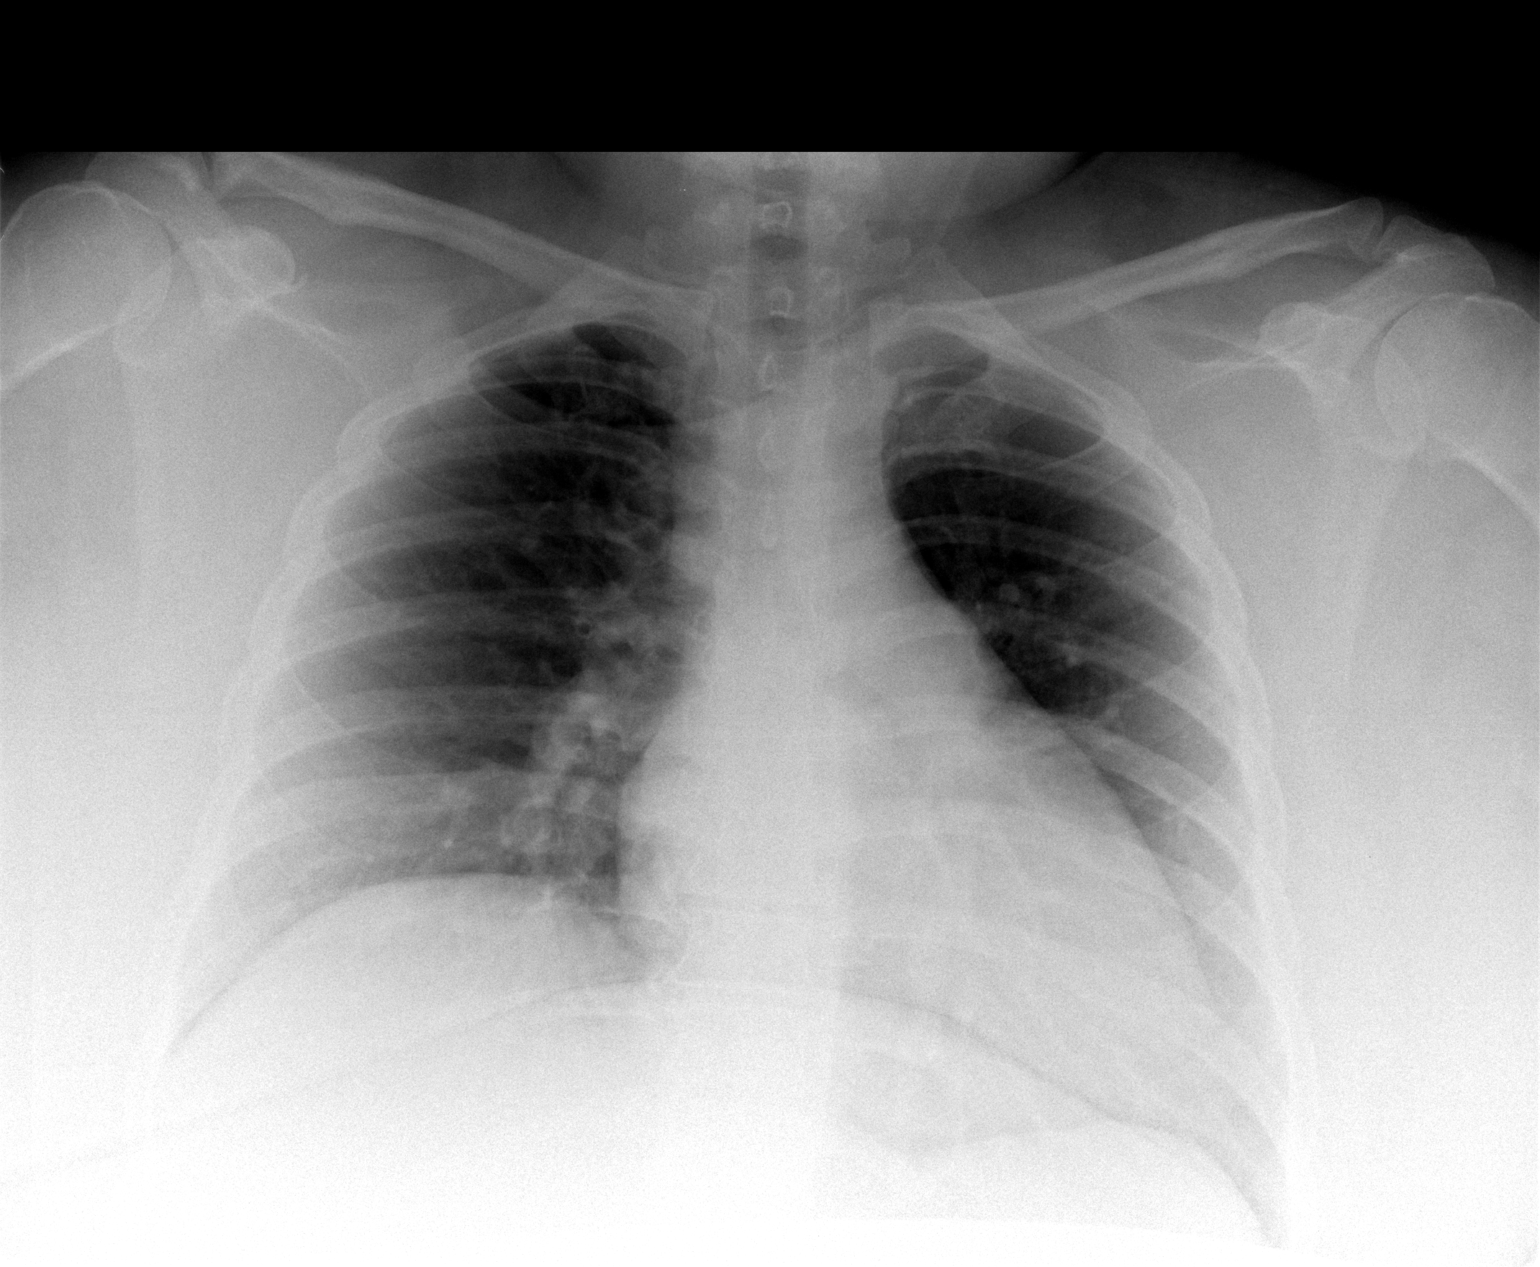

[1 of 1 positions shown; findings below may reference images not displayed]

FINDINGS: Limited exam by patient?s habitus.  No obvious bony injury.  No pneumothorax. Heart is enlarged with central pulmonary vascular prominence.
IMPRESSION: Cardiomegaly and central pulmonary vascular prominence.

## 2006-11-12 IMAGING — CR DG FOREARM 2V*R*
2 series · 2 of 2 positions shown · non-contrast
Comparison: none

CLINICAL DATA: Fall.
 RIGHT FOREARM - 2 VIEW:
 Patient was shielded as patient is pregnant.

[view not recorded (1 of 2)]
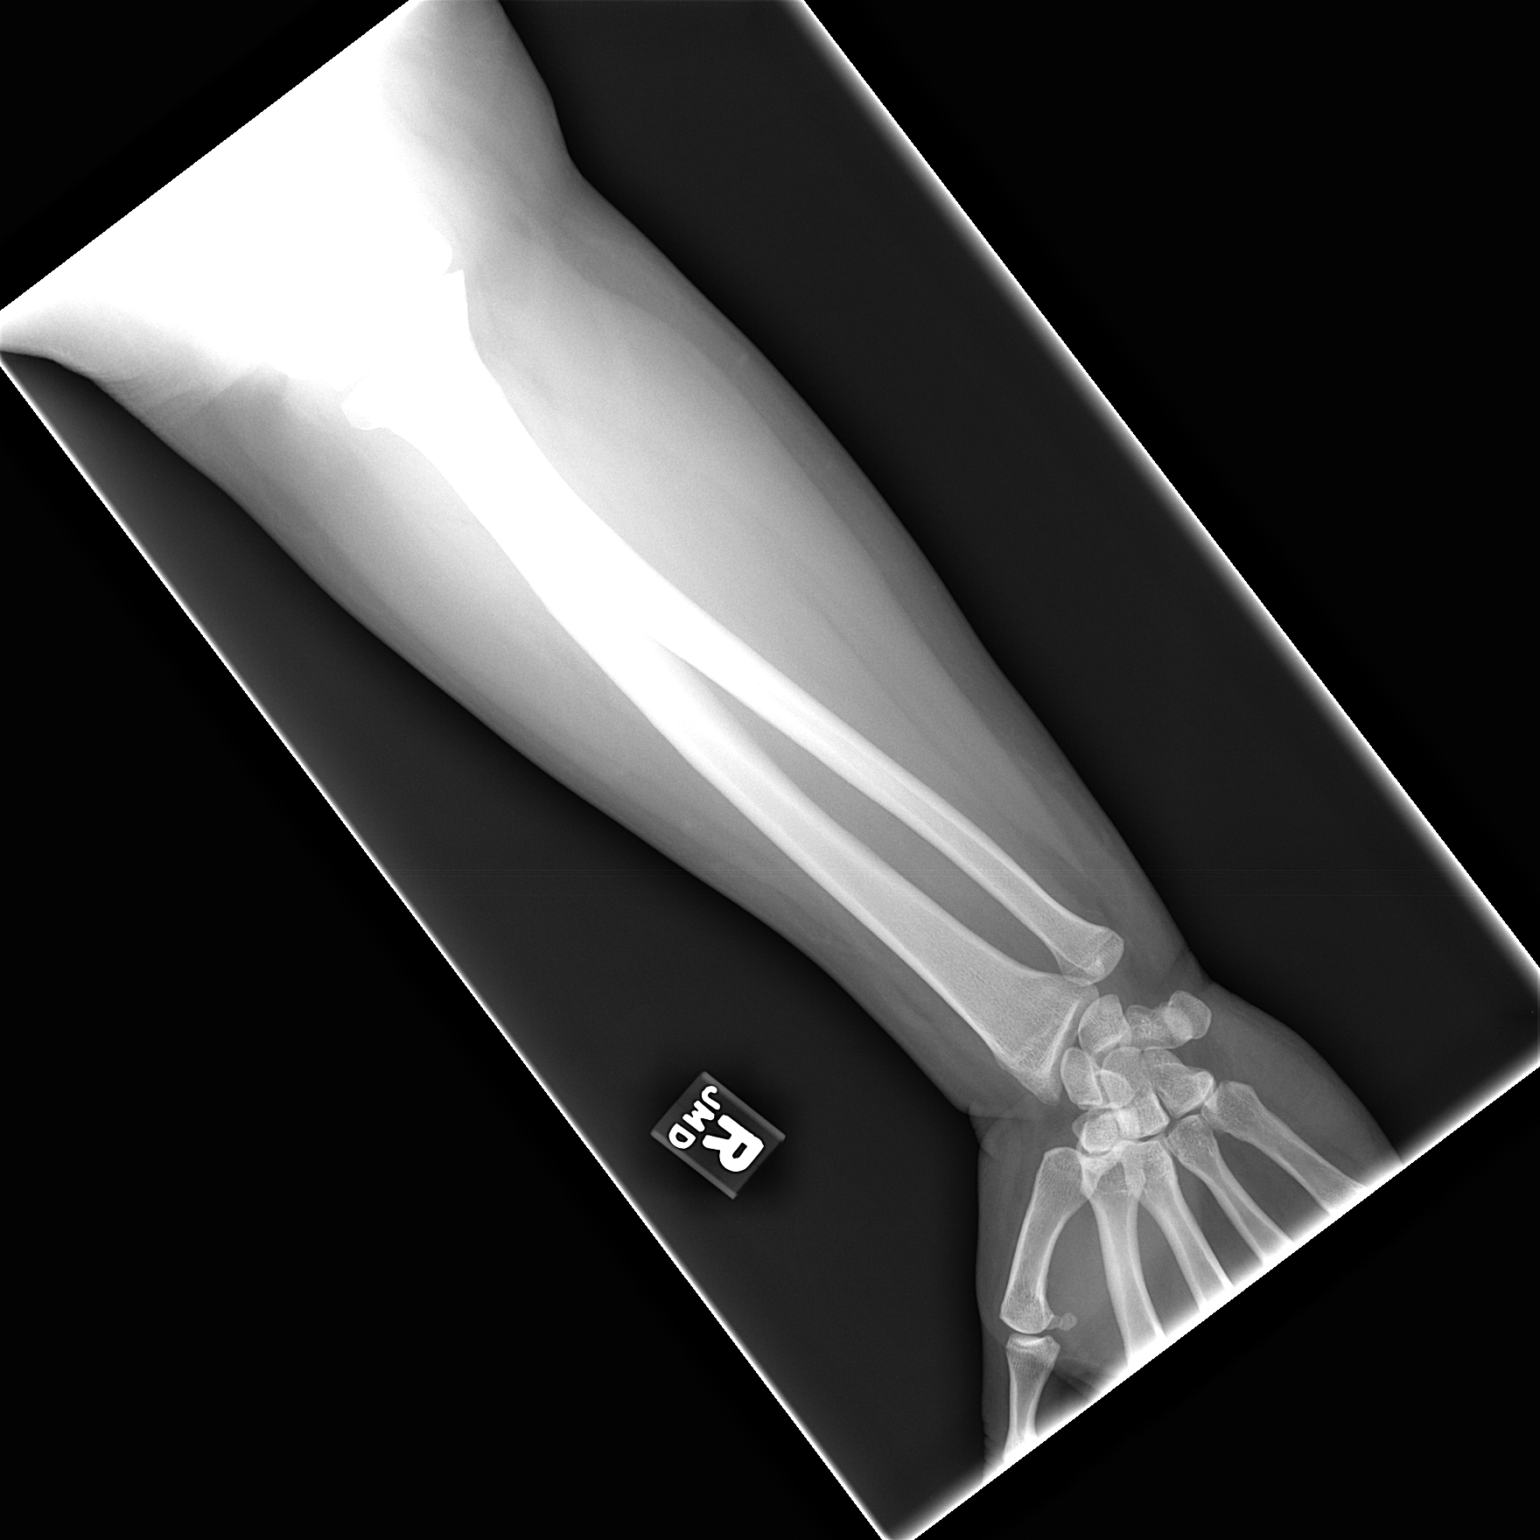

[view not recorded (2 of 2)]
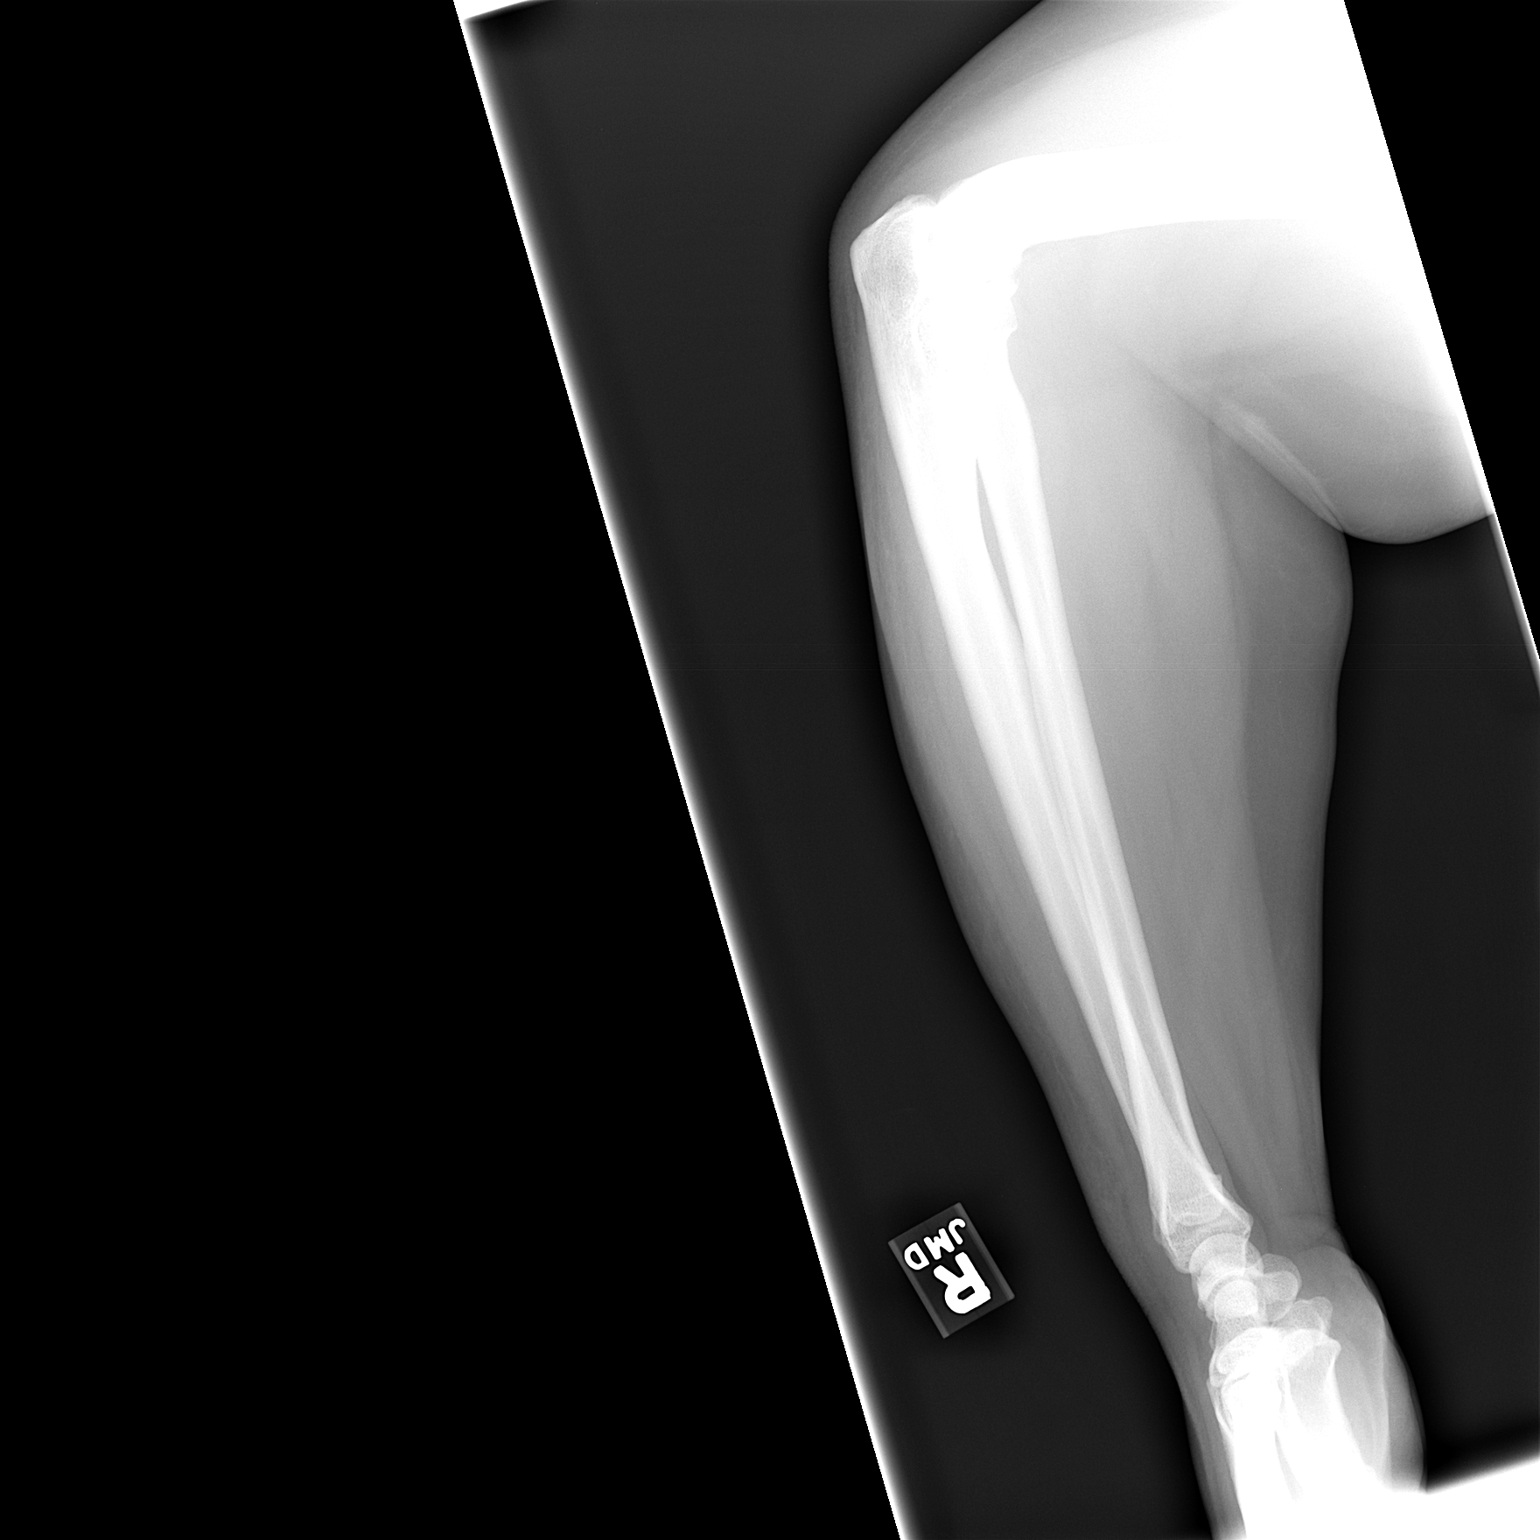

[2 of 2 positions shown; findings below may reference images not displayed]

FINDINGS: No evidence of fracture, dislocation.
IMPRESSION: No evidence of fracture or dislocation.

## 2006-11-19 IMAGING — US US OB TRANSVAGINAL
1 series · 14 of 15 positions shown · non-contrast
Comparison: none

CLINICAL DATA: 10 weeks pregnant.  Vaginal bleeding and cramping.  
 TRANSVAGINAL OBSTETRICAL US:
TECHNIQUE: Transvaginal ultrasound was performed for evaluation of the gestation as well as the maternal uterus and adnexal regions.
 There is a single intrauterine pregnancy showing fetal movement and cardiac activity with a rate of 171 bpm.  Amniotic fluid volume is normal.  Placenta appears normal.  No sign of abruption is seen.  Crown rump length is 3.4 cm for a gestational age of 10 weeks 2 days.  No free pelvic fluid.  No adnexal lesion.

[Series 1: us ob transvaginal · 0.23mm/px · 14 of 15 slices shown]
[im 1/15]
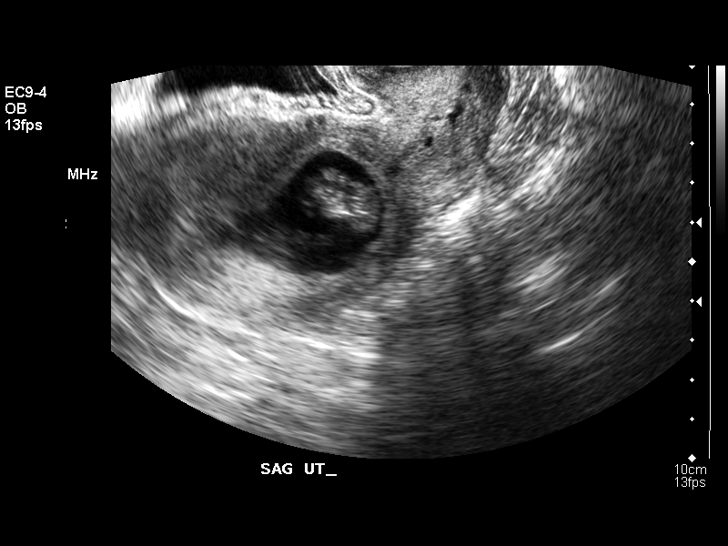
[im 2/15]
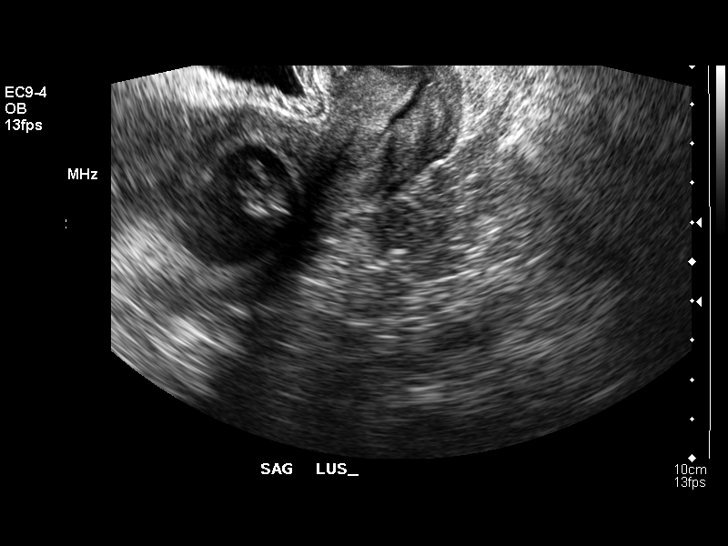
[im 3/15]
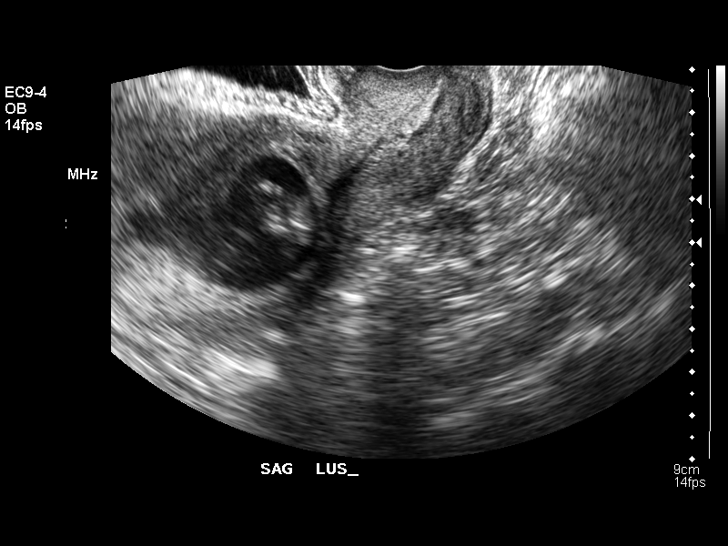
[im 4/15]
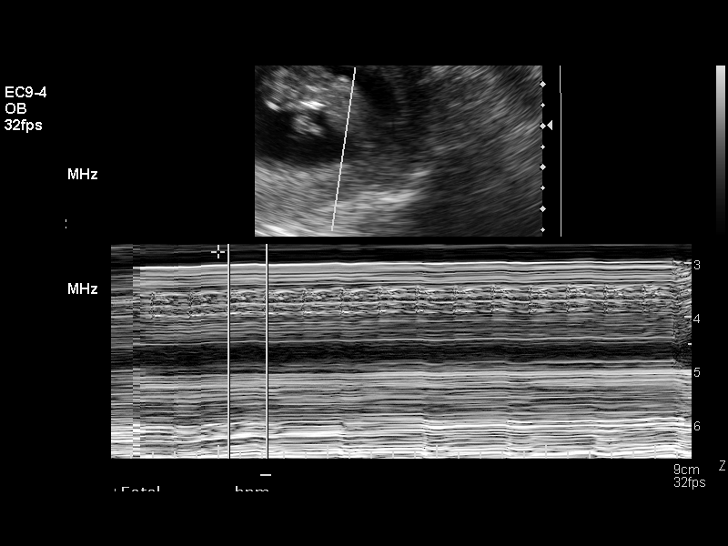
[im 5/15]
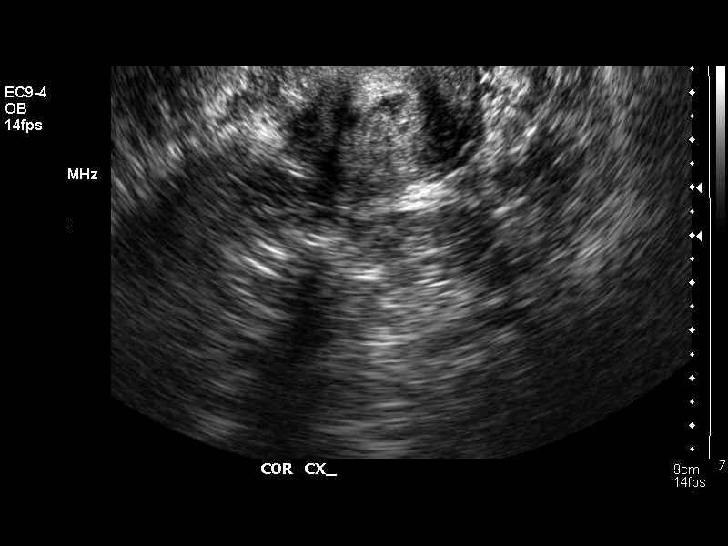
[im 6/15]
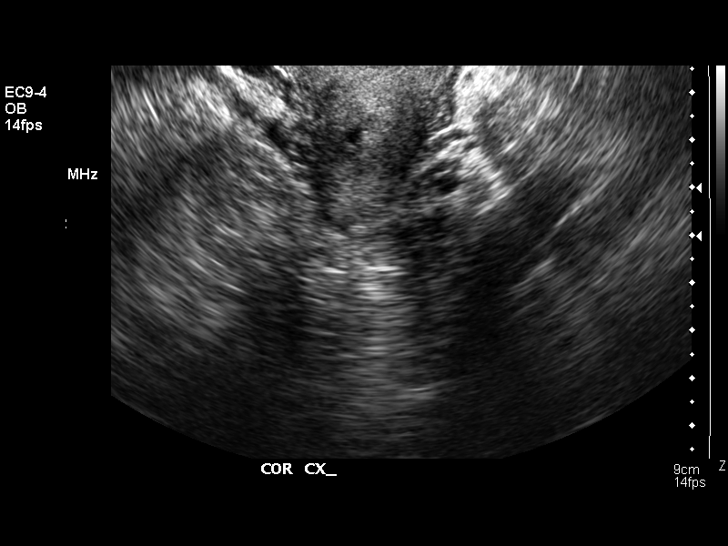
[im 7/15]
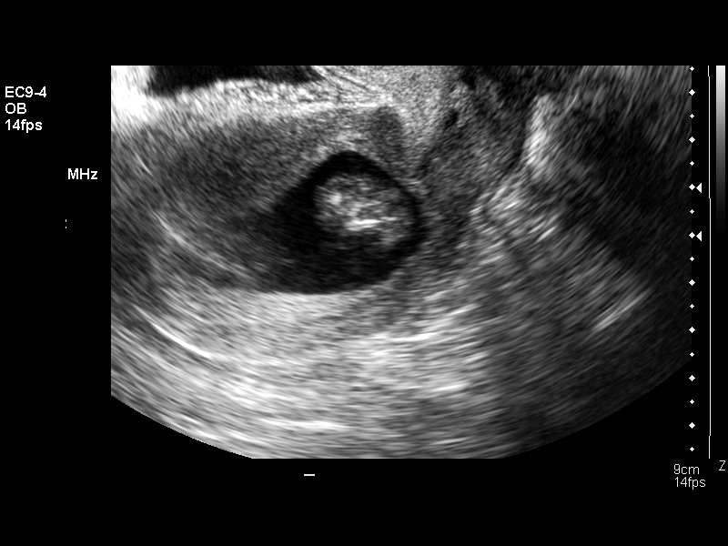
[im 9/15]
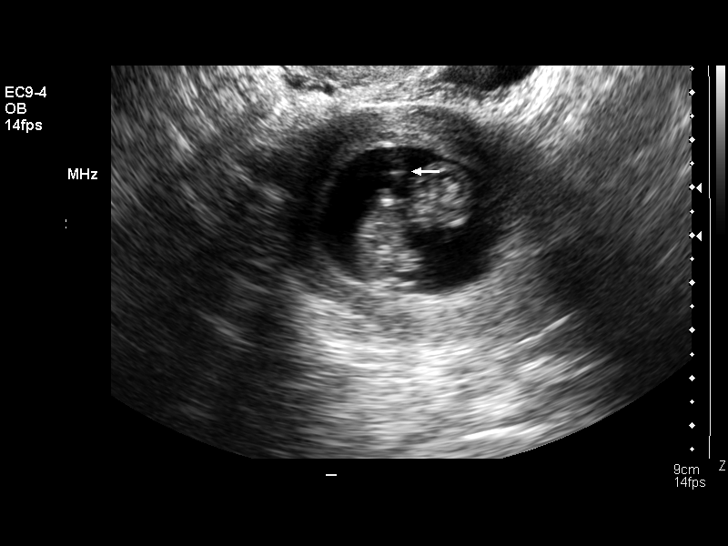
[im 10/15]
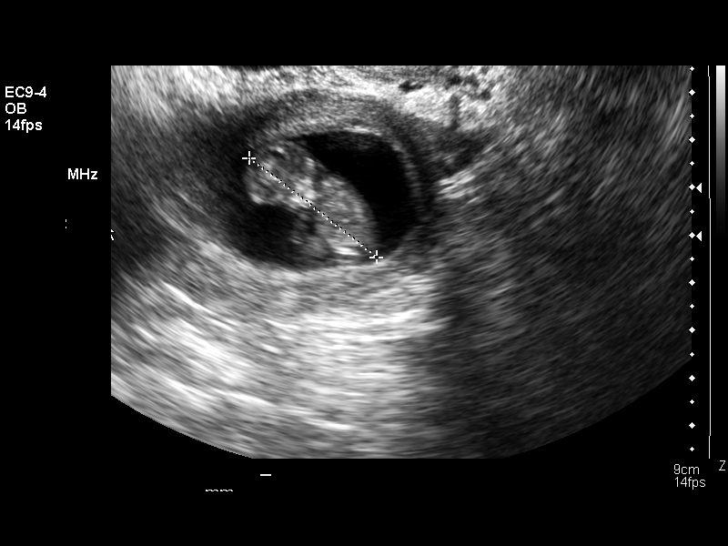
[im 11/15]
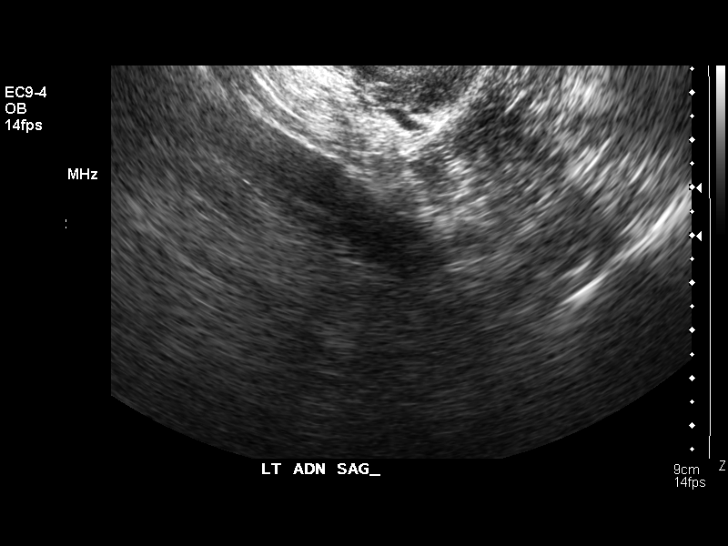
[im 12/15]
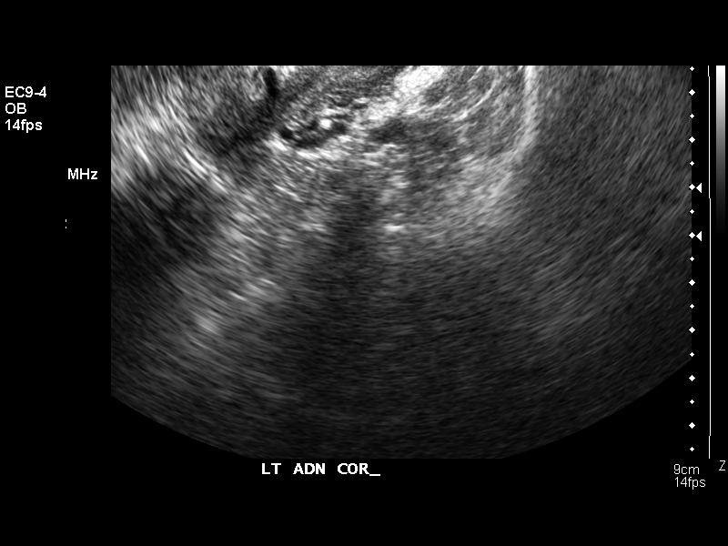
[im 13/15]
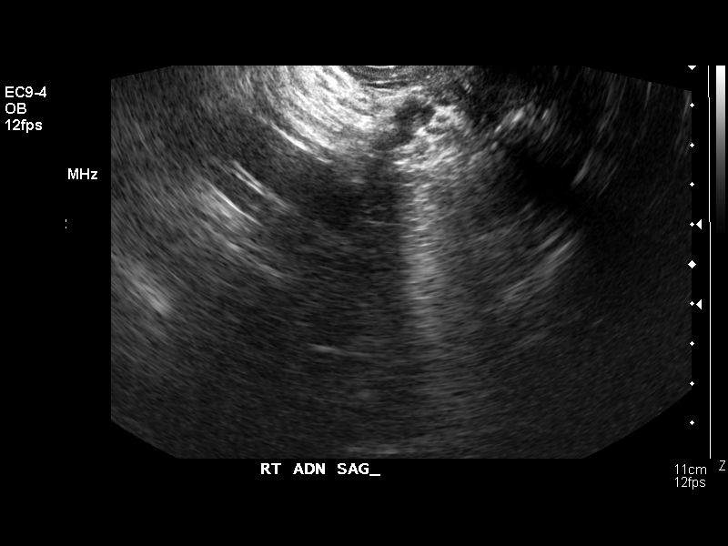
[im 14/15]
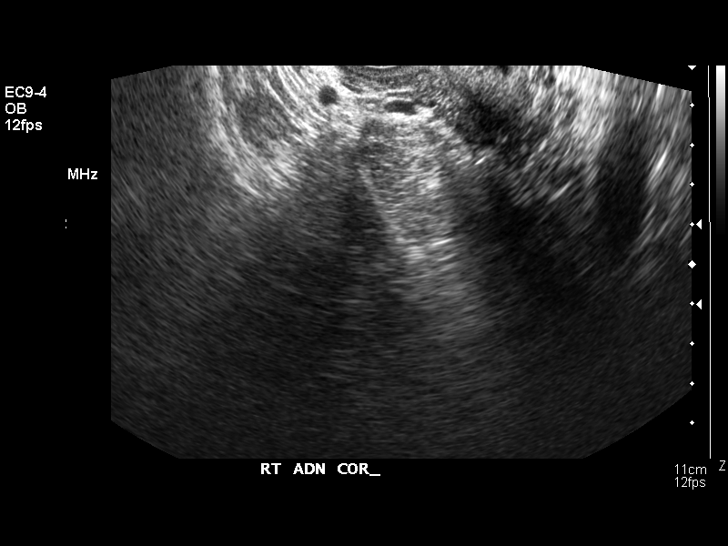
[im 15/15]
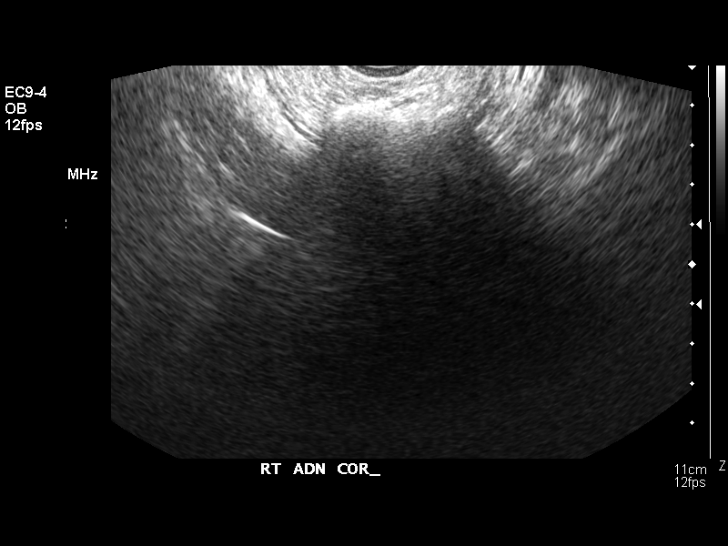

[14 of 15 positions shown; findings below may reference images not displayed]

IMPRESSION: Normal appearing single living intrauterine pregnancy at 10 weeks 2 days.

## 2006-12-16 IMAGING — CR DG CHEST 2V
2 series · 2 of 2 positions shown · non-contrast
Comparison: 10/03/05.

CLINICAL DATA: Hemoptysis.
 CHEST - 2 VIEWS ? 11/06/05 AT 7799 HOURS:

[view not recorded (1 of 2)]
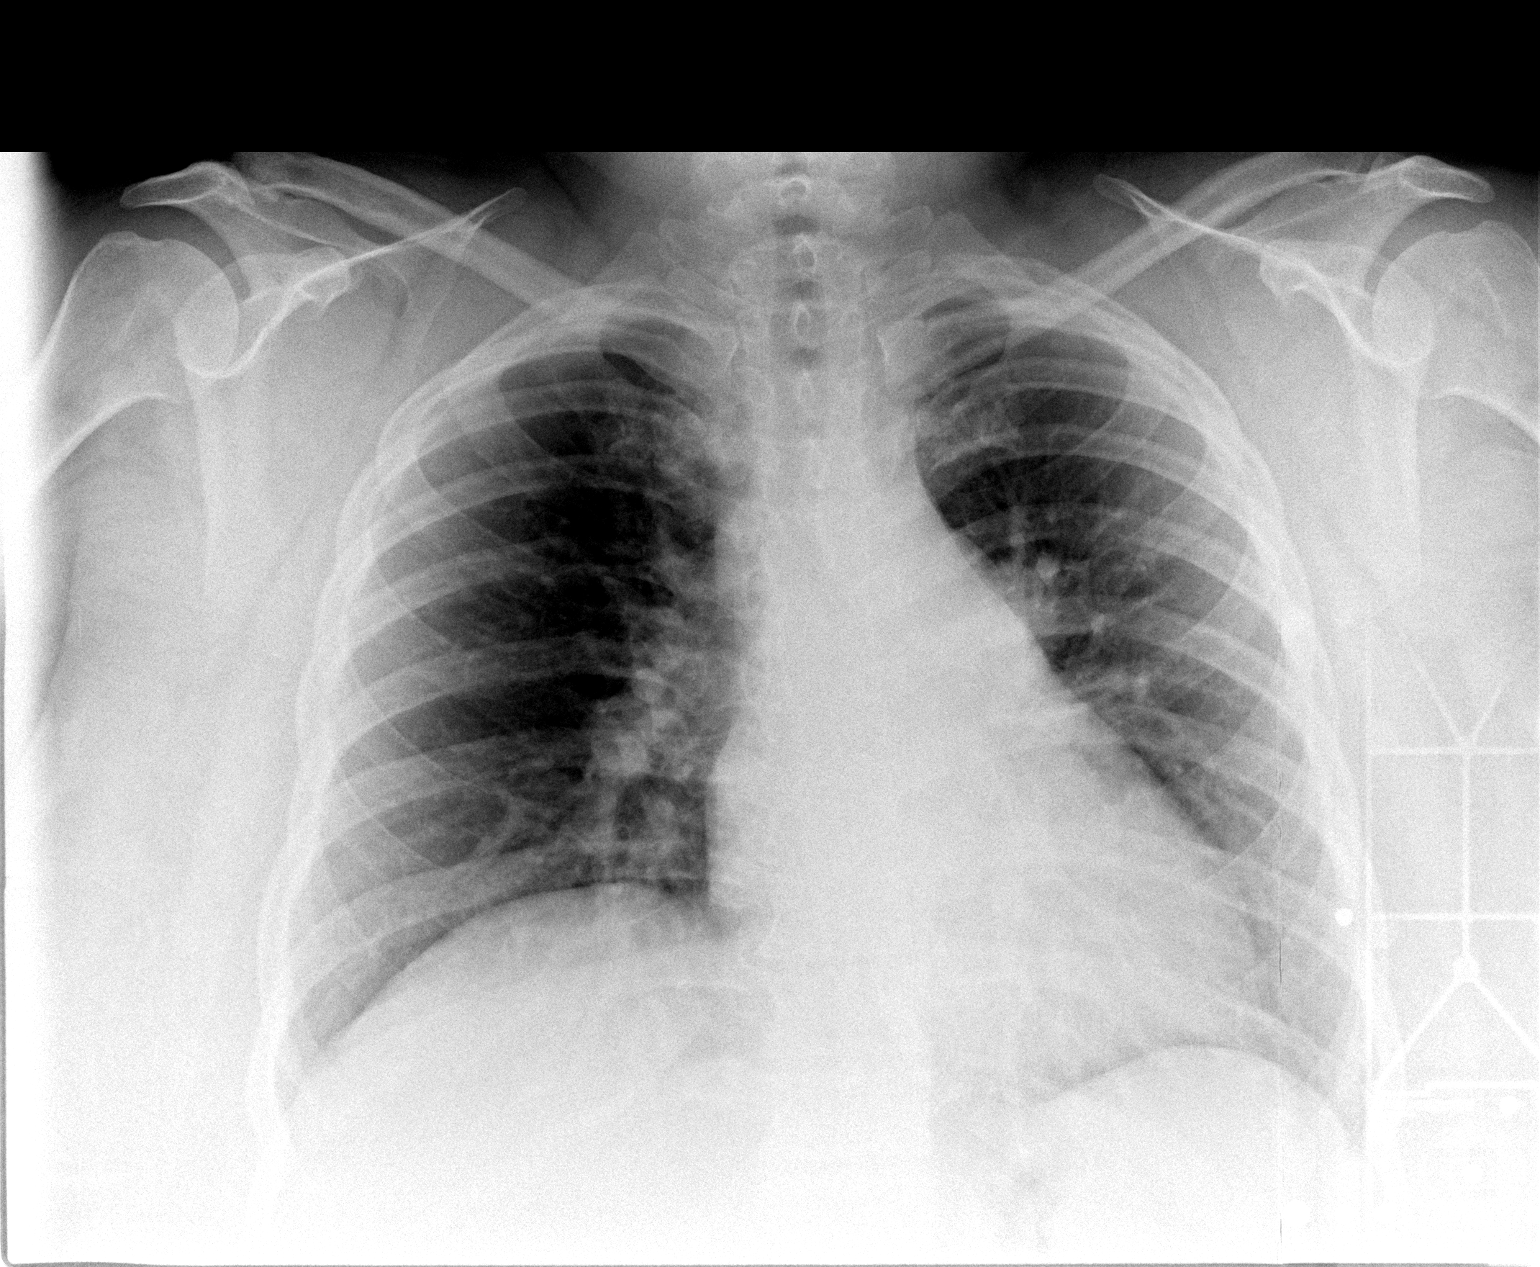

[view not recorded (2 of 2)]
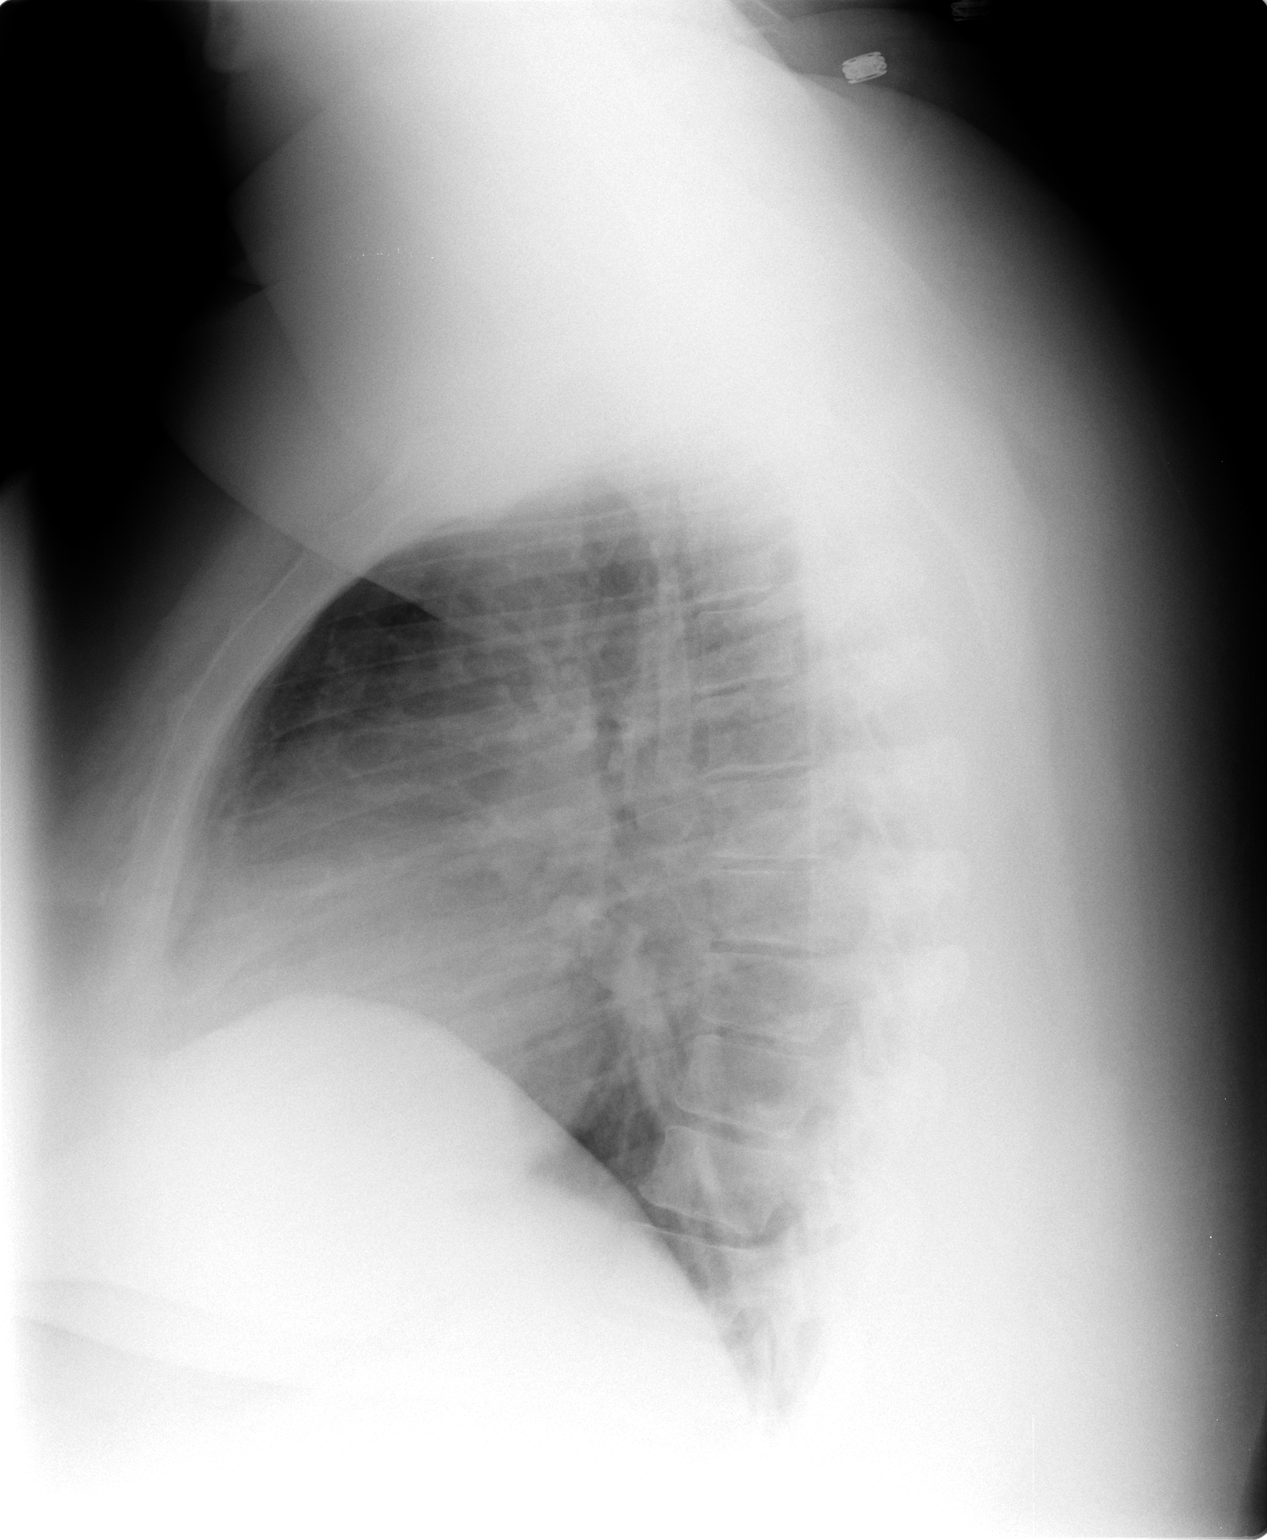

[2 of 2 positions shown; findings below may reference images not displayed]

FINDINGS: The heart is normal in size.  Bronchitic changes are stable.  The pulmonary vasculature is within normal limits.  No pneumothoraces or effusions are seen.
IMPRESSION: Bronchitic changes.

## 2006-12-24 ENCOUNTER — Inpatient Hospital Stay (HOSPITAL_COMMUNITY): Admission: EM | Admit: 2006-12-24 | Discharge: 2006-12-25 | Payer: Self-pay | Admitting: Emergency Medicine

## 2007-01-10 IMAGING — CR DG KNEE COMPLETE 4+V*L*
4 series · 4 of 4 positions shown · non-contrast
Comparison: none

CLINICAL DATA: Fell down flight of stairs.  Left knee pain. 
LEFT KNEE - 4 VIEW:
Suggestion of mild deformity of the proximal aspect of the ulna secondary to prior surgery and/or posttraumatic changes. There is a small corticated piece of bone in the soft tissues just superior to the head of the fibula.There is a defect in the head of the fibula which may represent the donor site for the piece of bone.  Mild osteophytic lipping articular margins of patella and medial aspect of distal femur and proximal tibia. No definite joint effusion. 
There is a U-shaped metallic fixation pin in the posterolateral aspect of the proximal tibial metaphysis.

[view not recorded (1 of 4)]
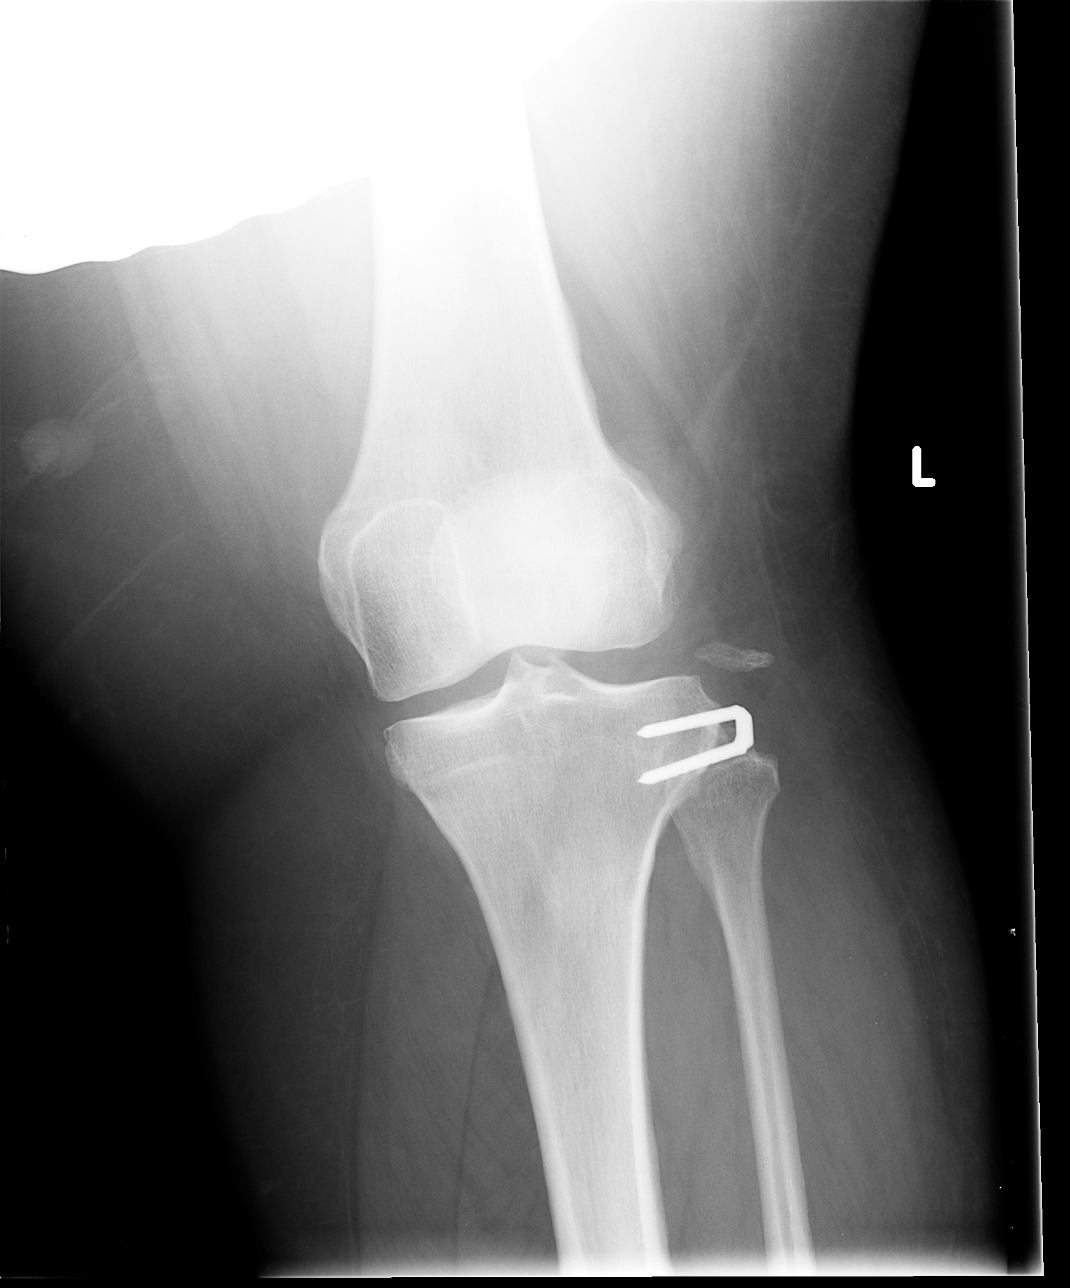

[view not recorded (2 of 4)]
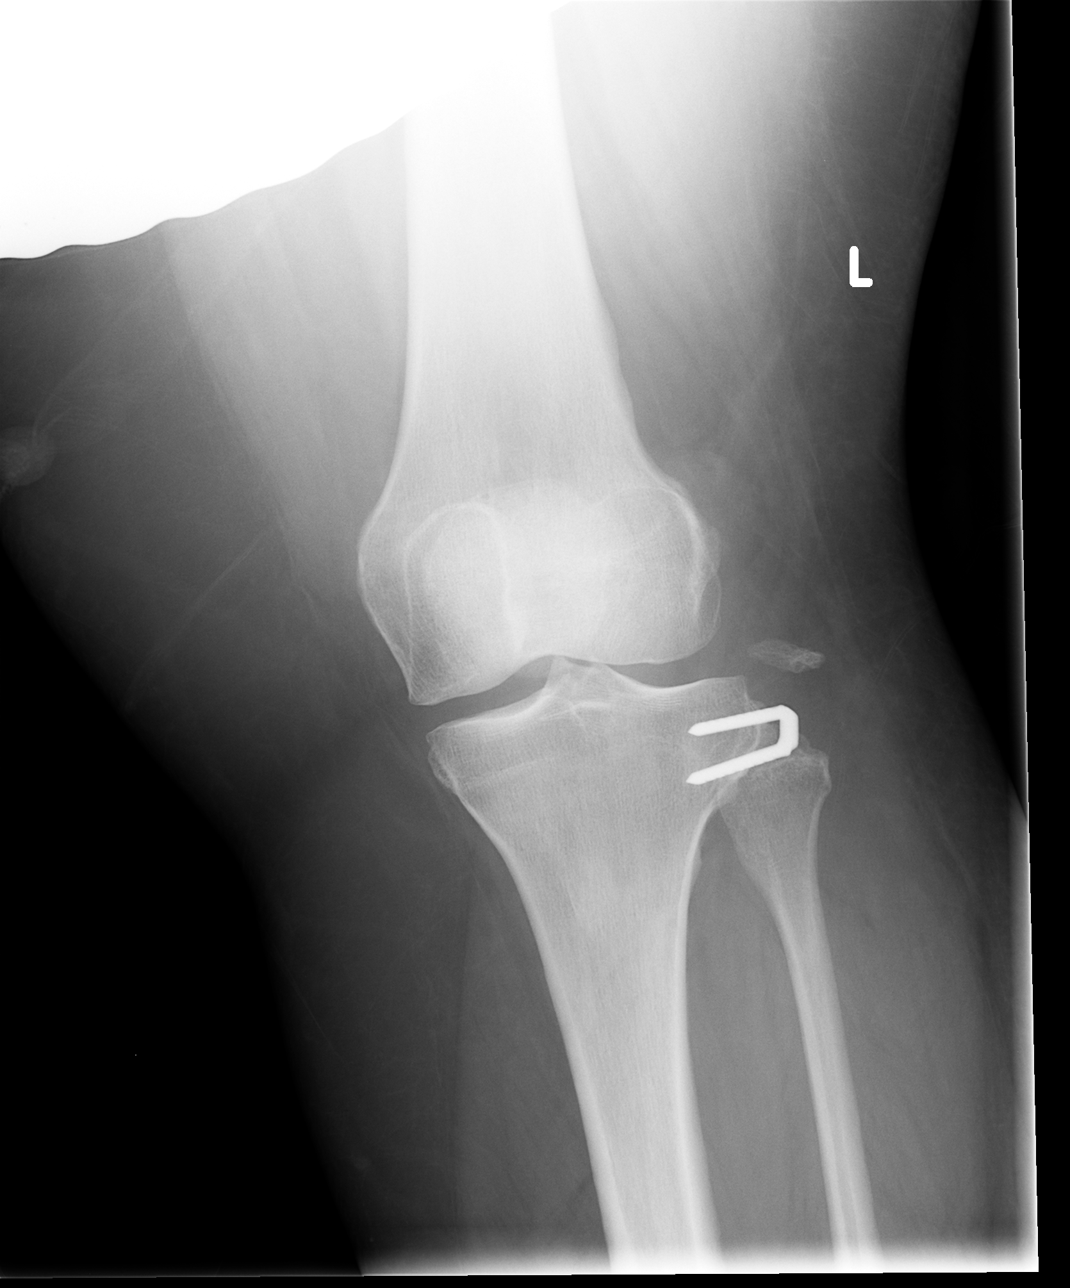

[view not recorded (3 of 4)]
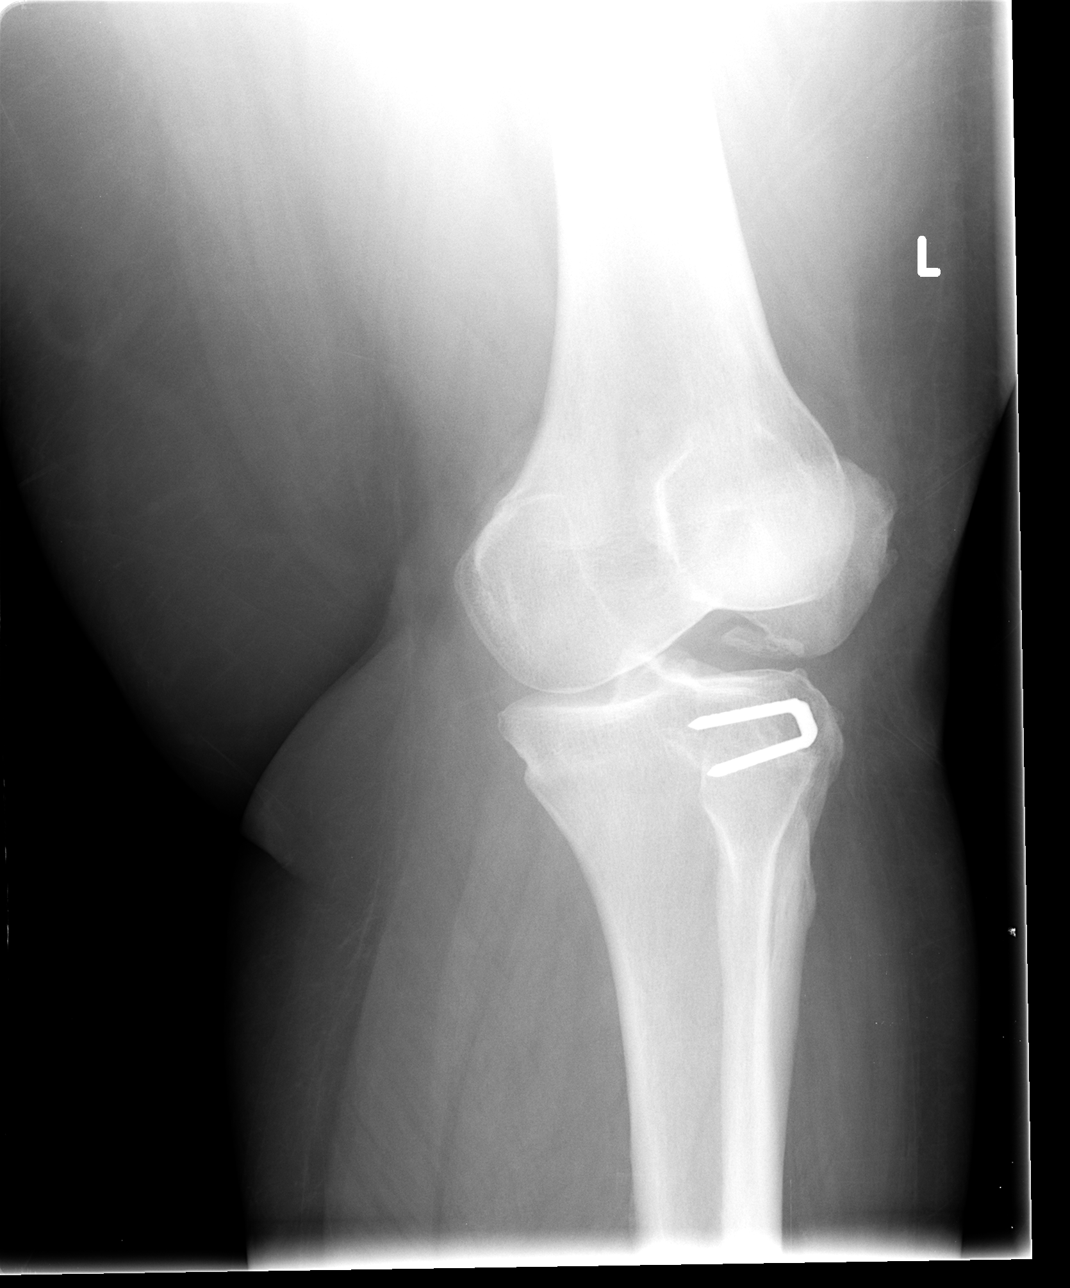

[view not recorded (4 of 4)]
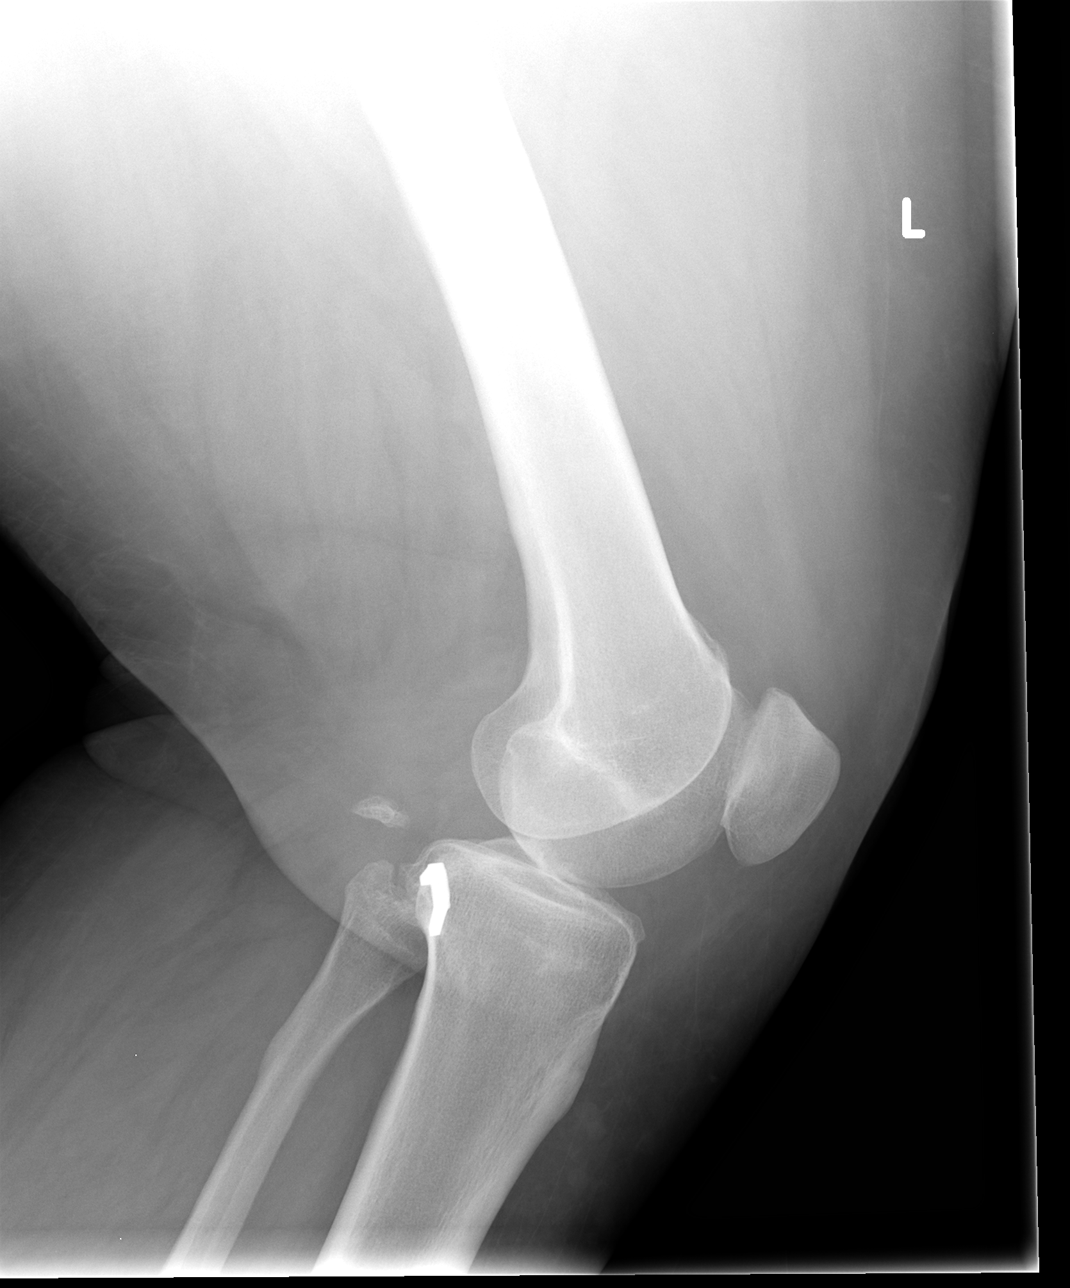

[4 of 4 positions shown; findings below may reference images not displayed]

IMPRESSION: Mild degenerative O/A changes.  Postoperative changes.

## 2007-01-10 IMAGING — CR DG ANKLE COMPLETE 3+V*L*
3 series · 3 of 3 positions shown · non-contrast
Comparison: none

CLINICAL DATA: Fell, has left ankle pain.
LEFT ANKLE - 3 VIEW:

[view not recorded (1 of 3)]
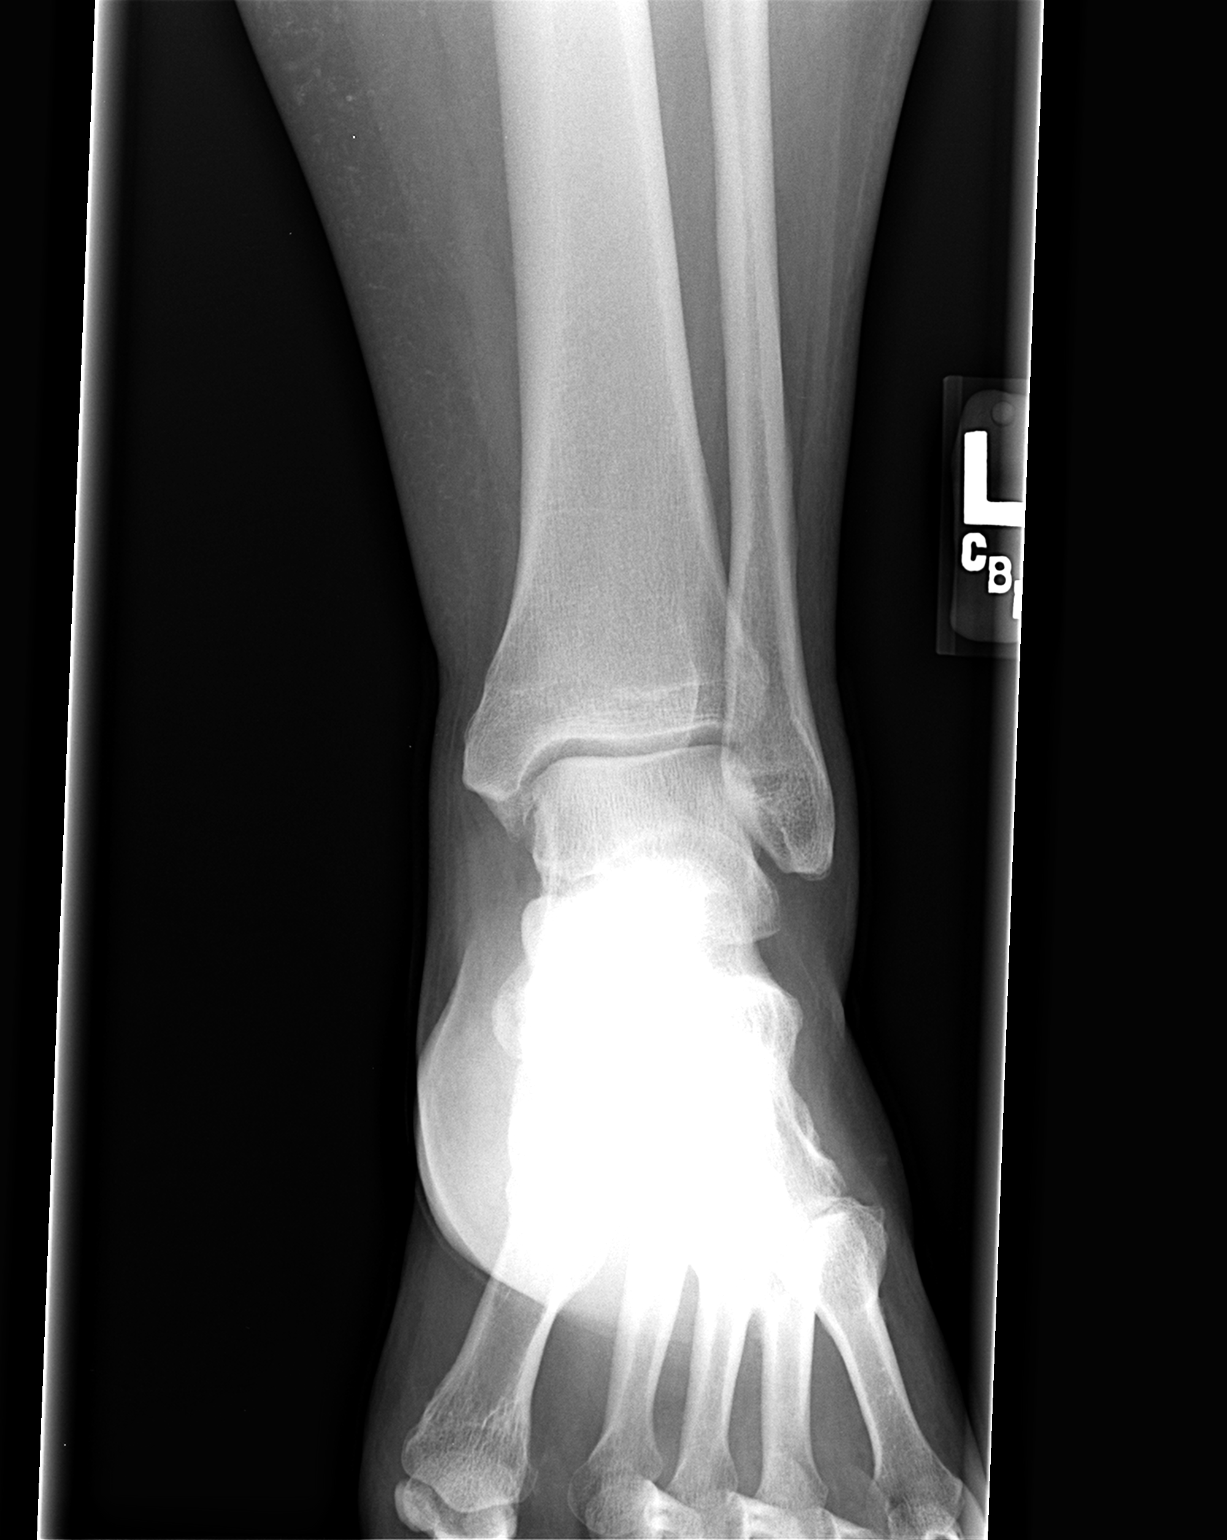

[view not recorded (2 of 3)]
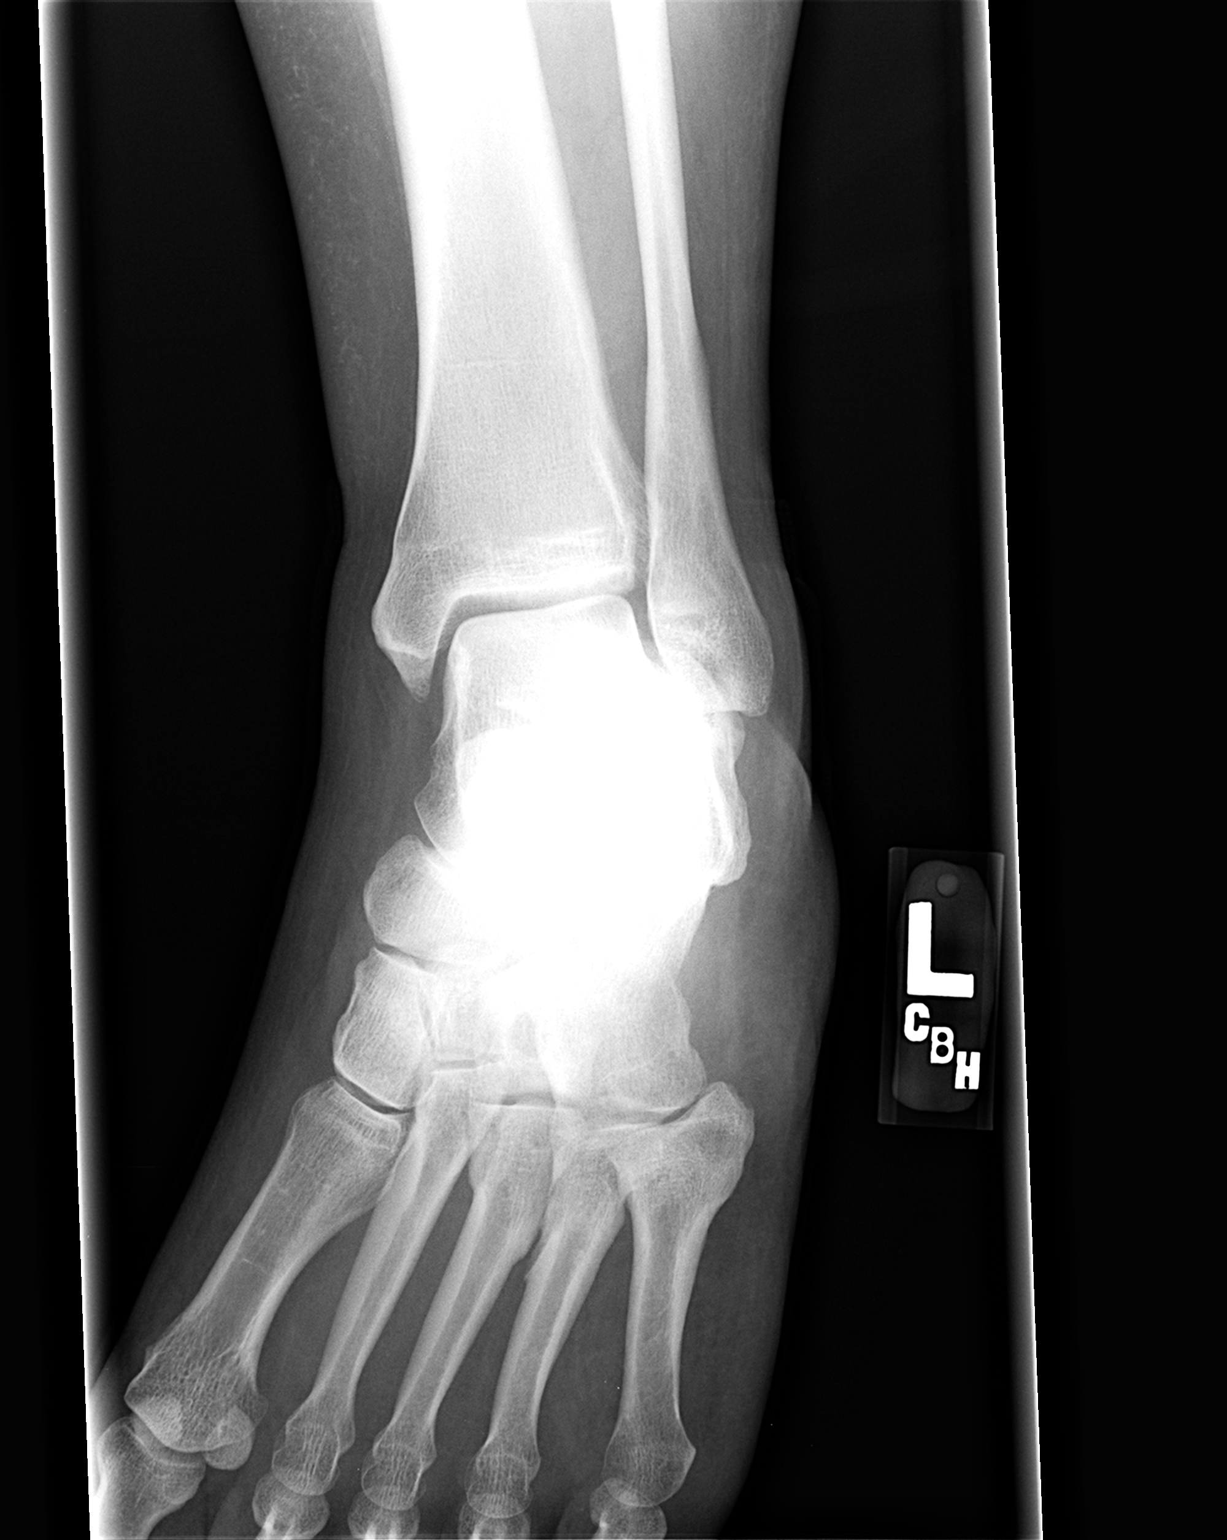

[view not recorded (3 of 3)]
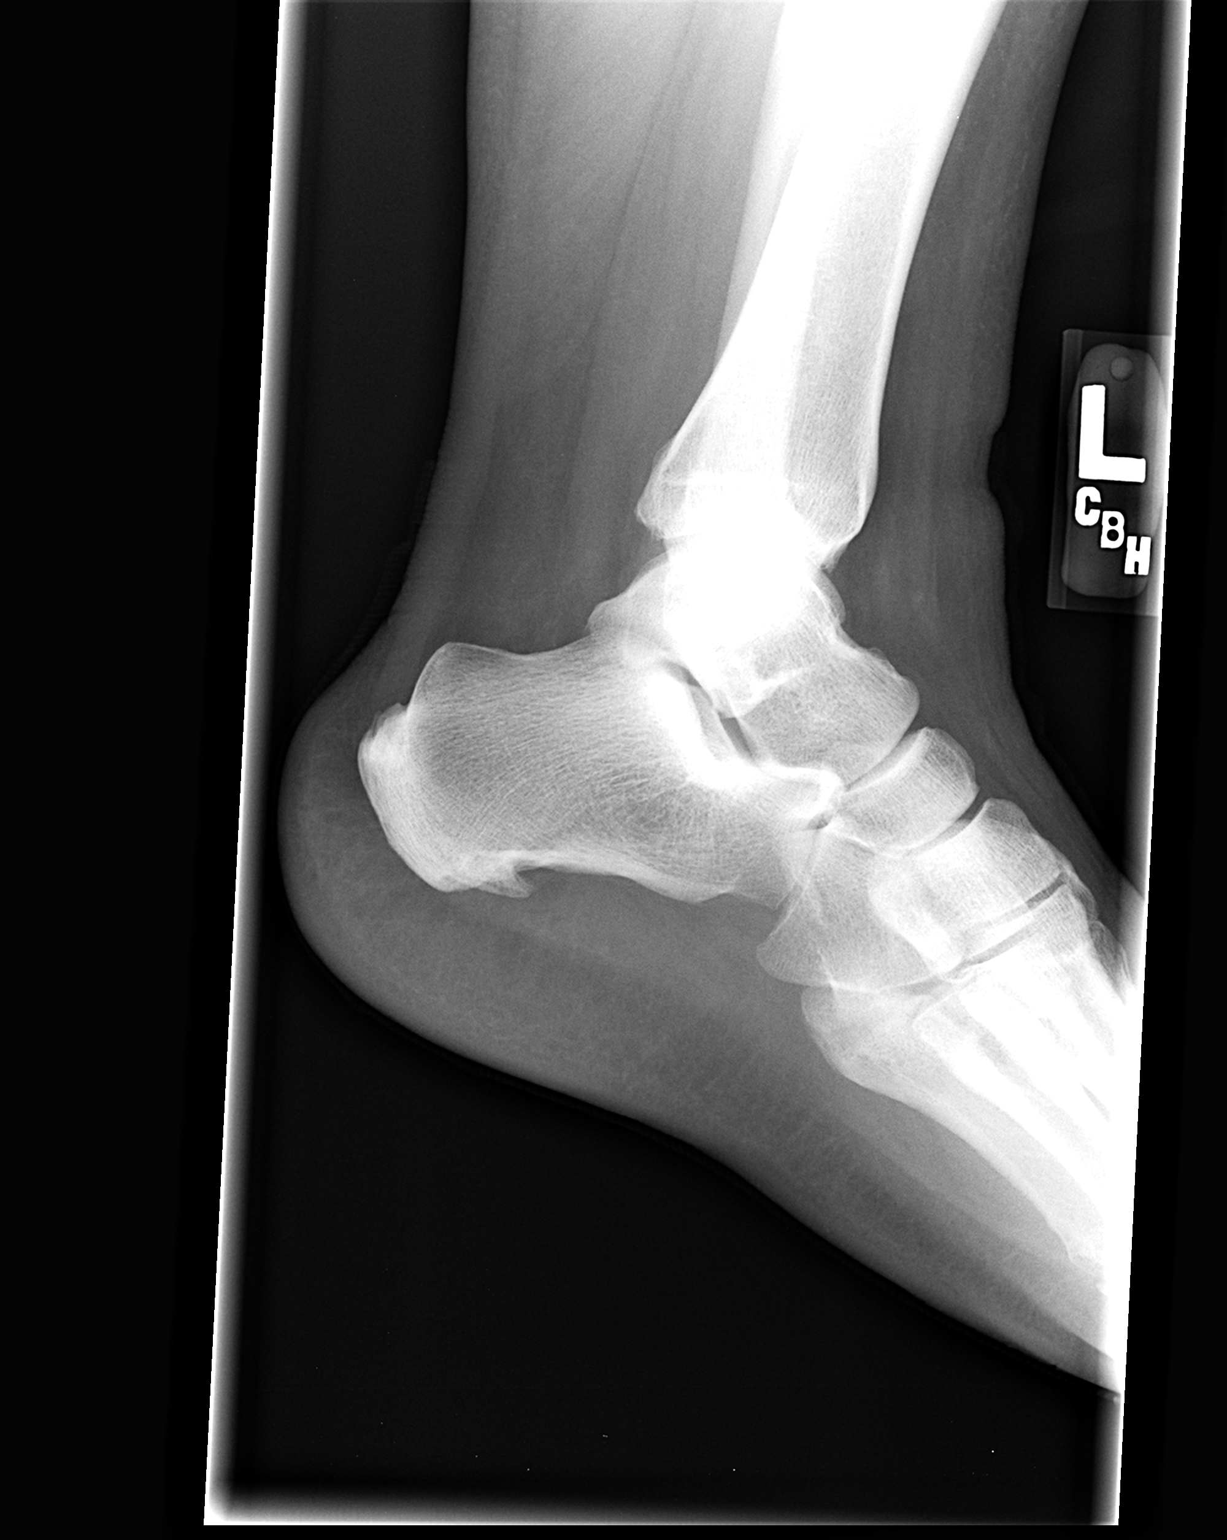

[3 of 3 positions shown; findings below may reference images not displayed]

FINDINGS: o evidence of fracture, dislocation, or joint effusion.  o evidence of arthropathy or other focal bone abnormality.  Soft tissues are unremarkable.
IMPRESSION: Negative.

## 2007-01-31 ENCOUNTER — Emergency Department (HOSPITAL_COMMUNITY): Admission: EM | Admit: 2007-01-31 | Discharge: 2007-01-31 | Payer: Self-pay | Admitting: Family Medicine

## 2007-02-19 IMAGING — US US OB COMP +14 WK
1 series · 13 of 28 positions shown · non-contrast
Comparison: none

CLINICAL DATA: Anatomic exam.  The patient weighs 340 lbs and overall scan resolution was markedly diminished by maternal body habitus.

[Series 1: us ob comp +14 wk · 0.37mm/px · 13 of 62 slices shown]
[im 3/62]
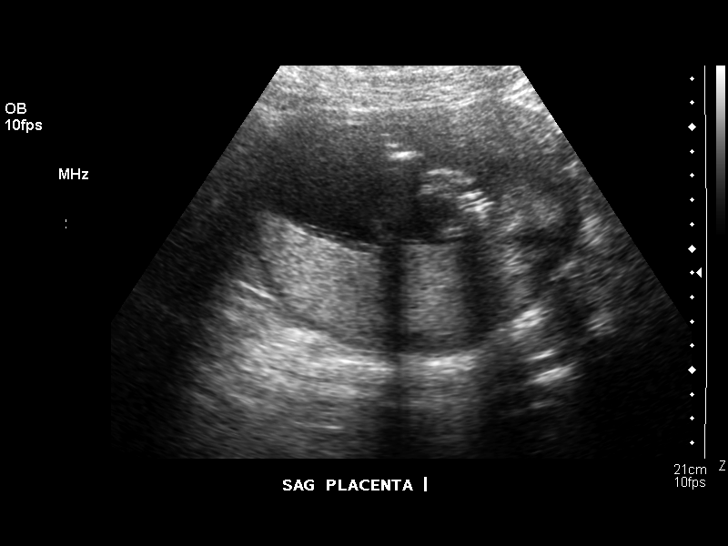
[im 7/62]
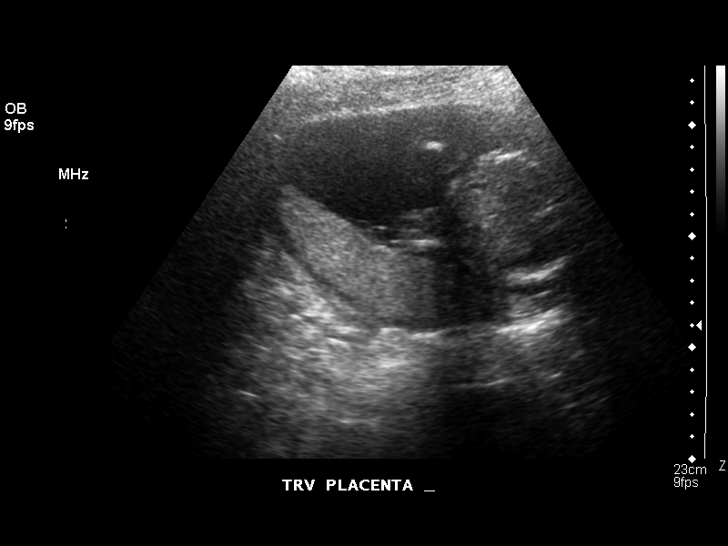
[im 12/62]
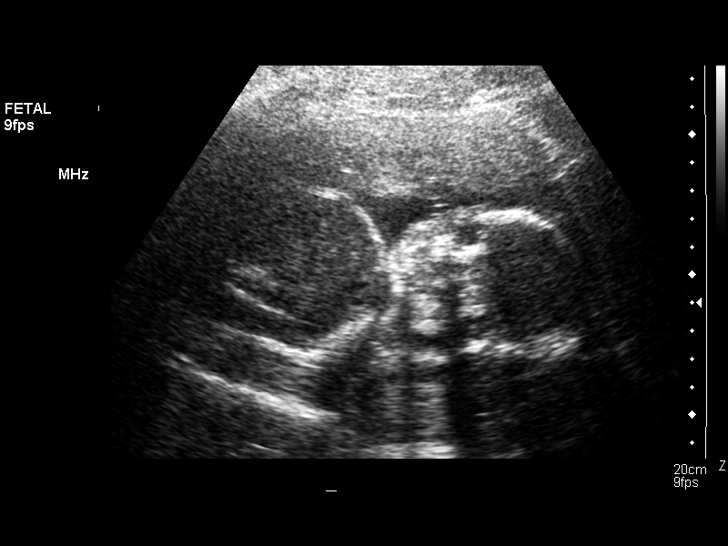
[im 16/62]
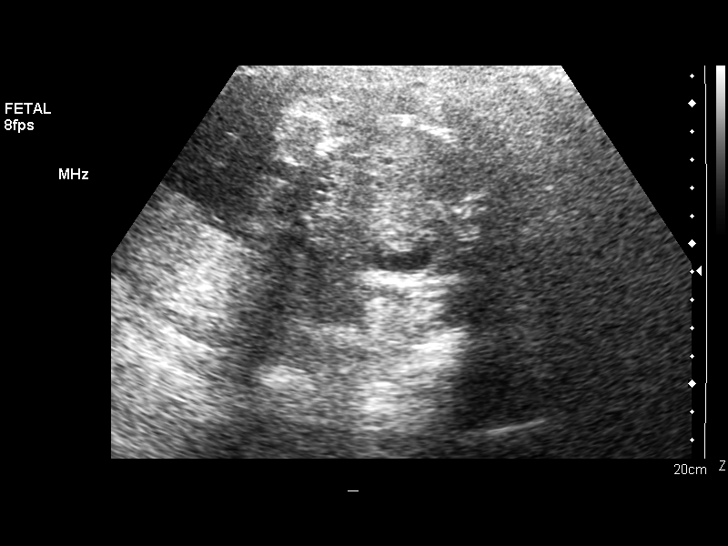
[im 21/62]
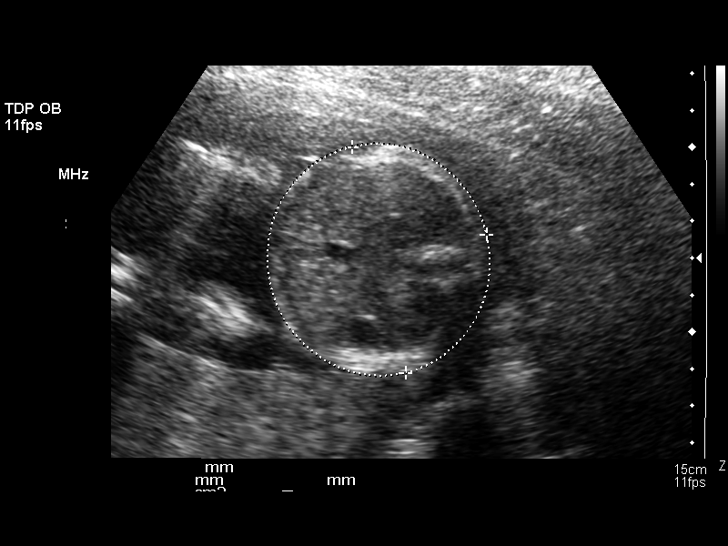
[im 25/62]
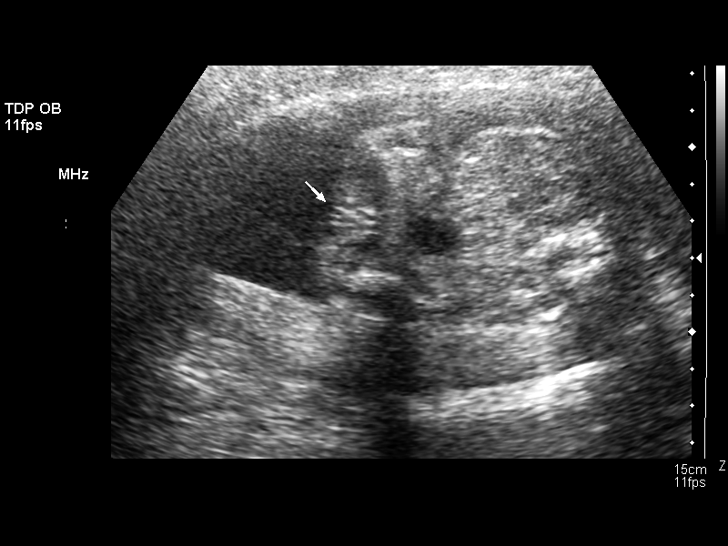
[im 32/62]
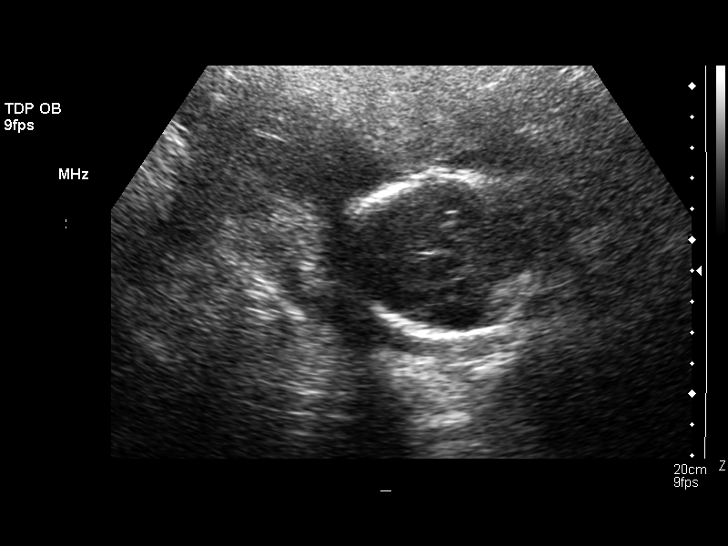
[im 37/62]
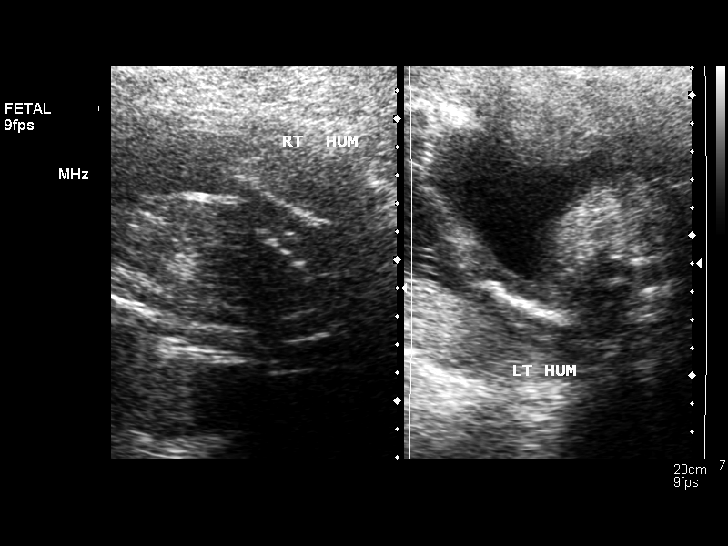
[im 41/62]
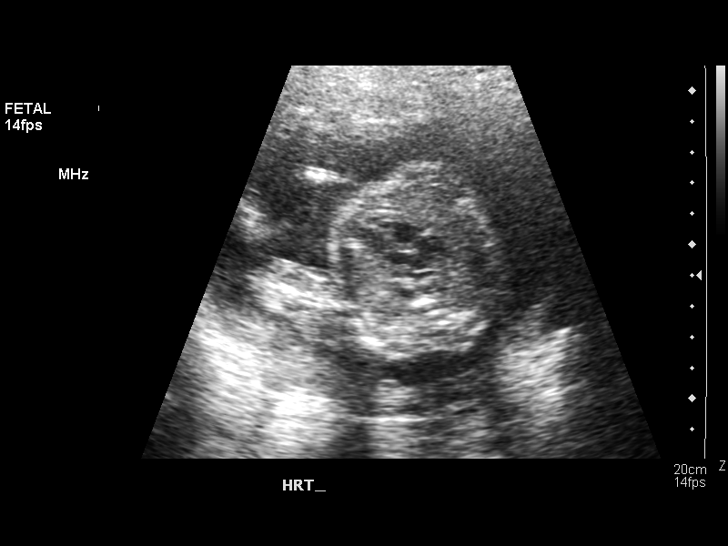
[im 46/62]
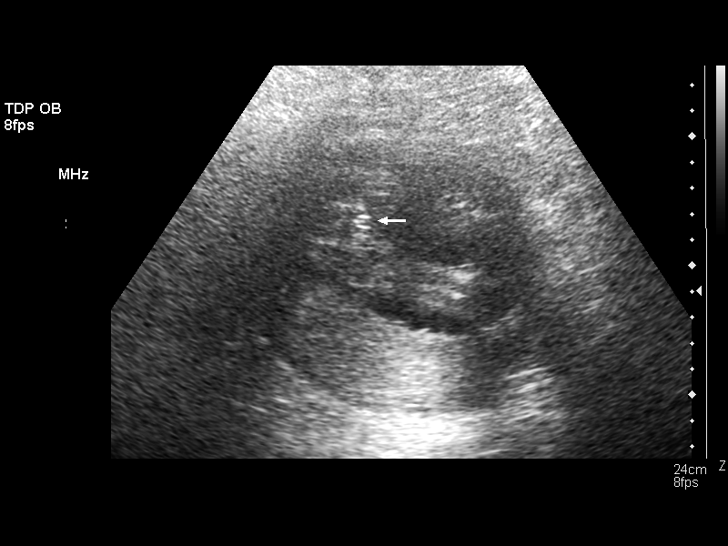
[im 50/62]
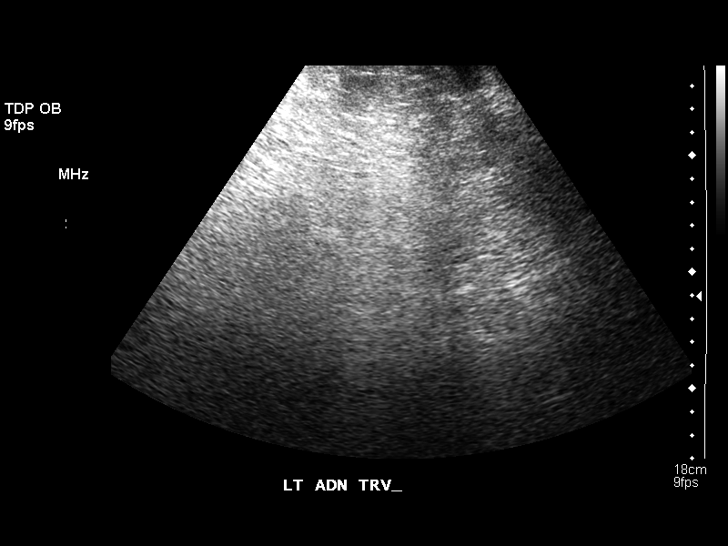
[im 55/62]
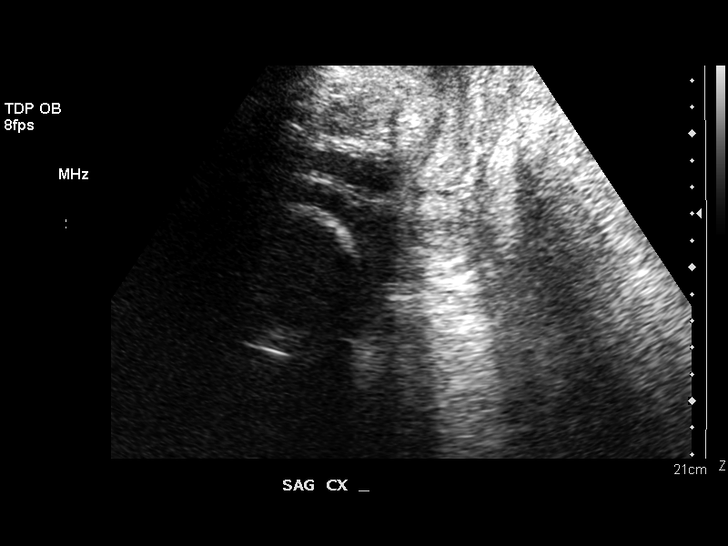
[im 59/62]
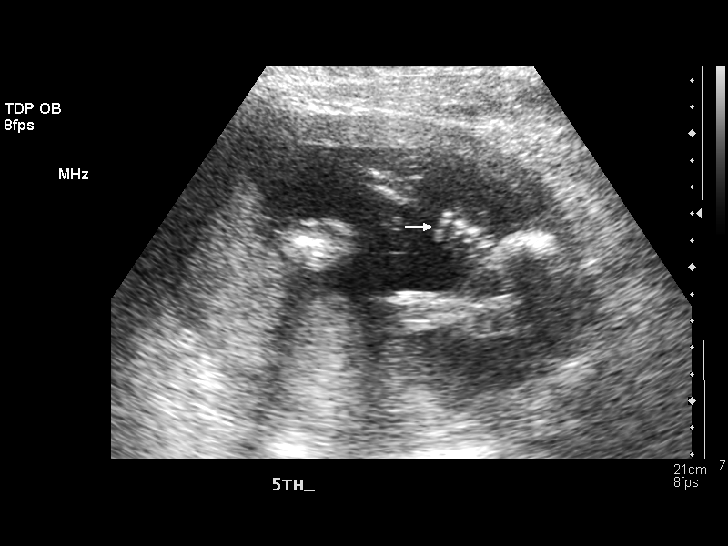

[13 of 28 positions shown; findings below may reference images not displayed]

OBSTETRICAL ULTRASOUND:
Number of Fetuses:  1
Heart Rate:  133
Movement:  Yes
Breathing:  No  
Presentation:  Transverse with head on maternal left
Placental Location:  Posterior
Grade:  I
Previa:  No
Amniotic Fluid (Subjective):  Normal
Amniotic Fluid (Objective):   5.5 cm Vertical pocket 

FETAL BIOMETRY
BPD:  5.3 cm   22 w 1 d  
HC:  21.0 cm  22 w 6 d
AC:  19.0 cm   23 w 5 d
FL:    4.3 cm  24 w 1 d
HL:    3.8 cm  23 w 5 d
MEAN GA:  23 w 2 d

FETAL ANATOMY
Lateral Ventricles:    Not visualized 
Thalami/CSP:      Visualized 
Posterior Fossa:  Visualized 
Nuchal Region:    Not visualized 
Spine:      Not visualized 
4 Chamber Heart on Left:      Not visualized 
Stomach on Left:      Visualized 
3 Vessel Cord:    Visualized 
Cord Insertion site:    Visualized 
Kidneys:  Visualized 
Bladder:  Visualized 
Extremities:      Visualized 

ADDITIONAL ANATOMY VISUALIZED:  Upper lip, orbits profile, diaphragm, heel, 5th digit, and female genitalia.

Evaluation limited by:  Maternal habitus.

MATERNAL UTERINE AND ADNEXAL FINDINGS
Cervix: 4.1 cm Translabially
IMPRESSION: 1.  Single intrauterine pregnancy demonstrating an estimated gestational age by ultrasound of 23 weeks and 2 days.  Correlation with assigned gestational age of 23 weeks and 1 day correlates with appropriate growth.
2.  A very limited anatomic exam was possible due to maternal body habitus.  The posterior fossa and cavum septum pellucidum could be visualized, but intracranial contents were otherwise poorly assessed.  The fetal spine and cardiac anatomy was too poorly resolved to be well visualized.  The remaining anatomy appeared within normal limits but resolution of the visualized anatomy was diminished.
3.  Subjectively and quantitatively normal amniotic fluid volume and normal cervical length.

## 2007-03-26 IMAGING — US US OB TRANSVAGINAL MODIFY
1 series · 14 of 26 positions shown · non-contrast
Comparison: OB ultrasound 01/10/06.

CLINICAL DATA: 28 weeks pregnant.  Abdominal pain.

 LIMITED OBSTETRICAL ULTRASOUND AND TRANSVAGINAL OB US:
TECHNIQUE: The transvaginal study today was done to better assess the cervix.
 Number of Fetuses:  1
 Heart Rate:  157
 Movement:  Yes
 Breathing:  No
 Presentation:  Cephalic
 Placental Location:  Posterior
 Grade: I
 Previa:  No
 Amniotic Fluid (Subjective):  Normal
 Amniotic Fluid (Objective):  12.8 cm AFI (5th -95th%ile = 9.4 ? 22.8 cm for 28 wks)
 Fetal measurements and complete anatomic evaluation were not requested.  The following fetal anatomy was visualized during this exam: Stomach, kidneys, bladder, and female genitalia.
 MATERNAL UTERINE AND ADNEXAL FINDINGS
 Cervix:  3.2 cm Transvaginally

[Series 1: us ob transvaginal modify · 14 of 26 slices shown]
[im 1/26]
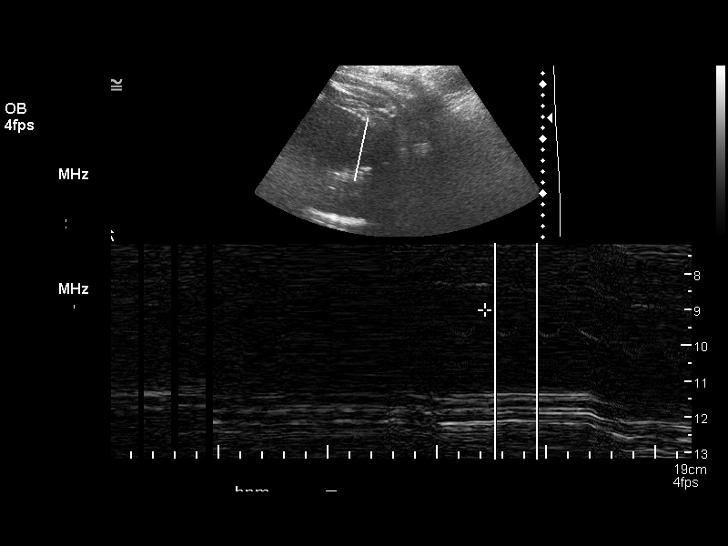
[im 3/26]
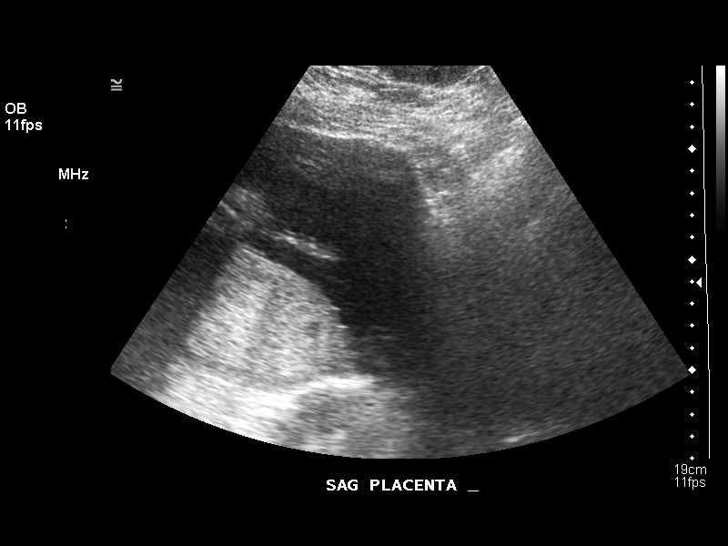
[im 5/26]
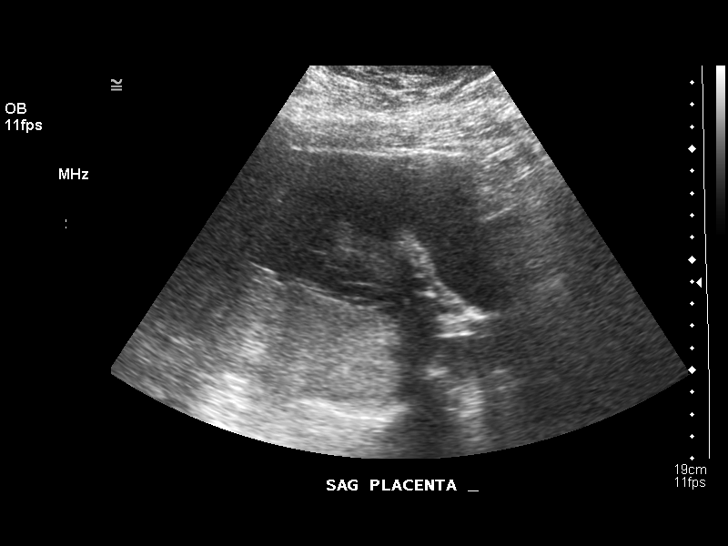
[im 7/26]
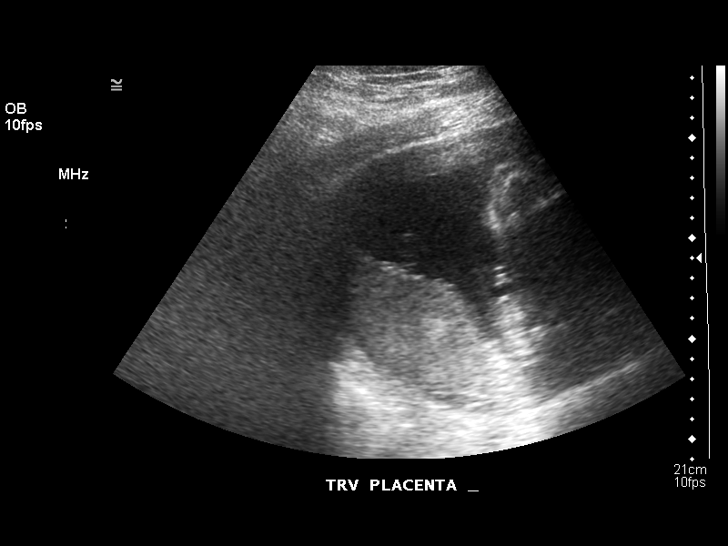
[im 9/26]
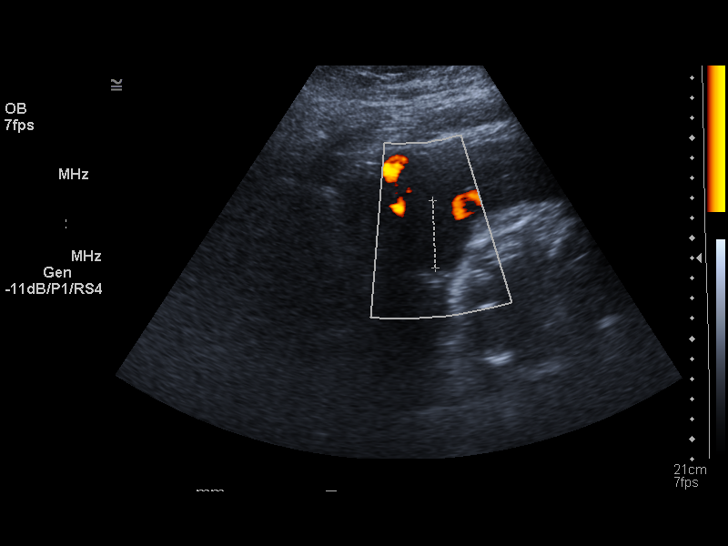
[im 11/26]
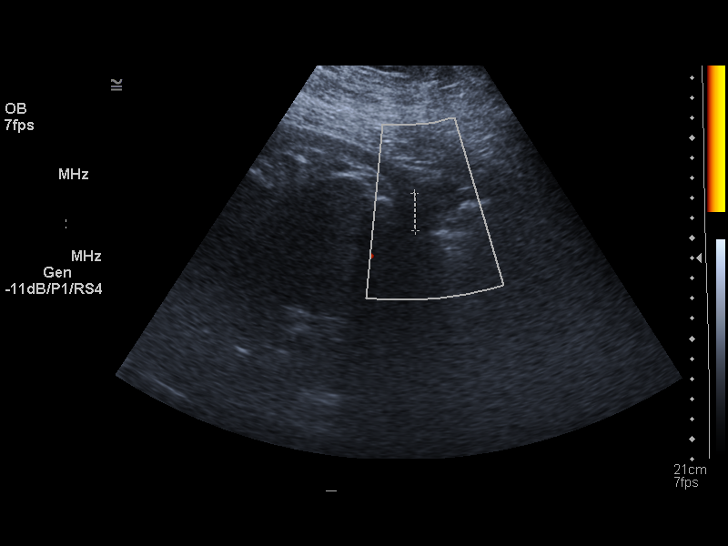
[im 13/26]
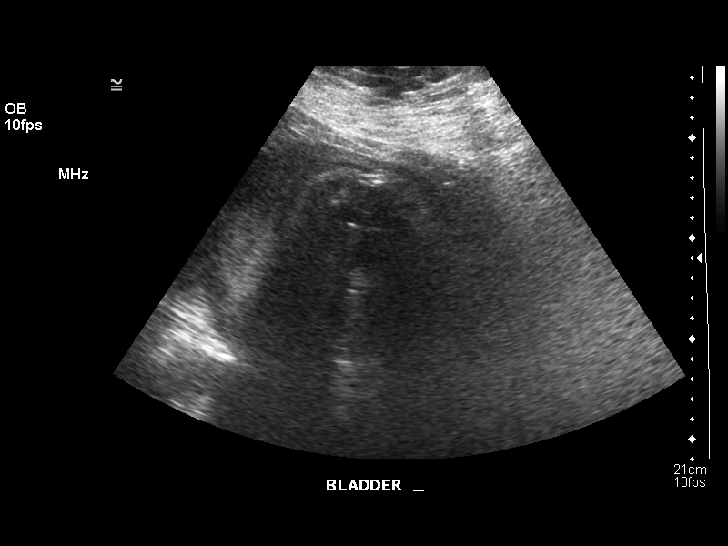
[im 14/26]
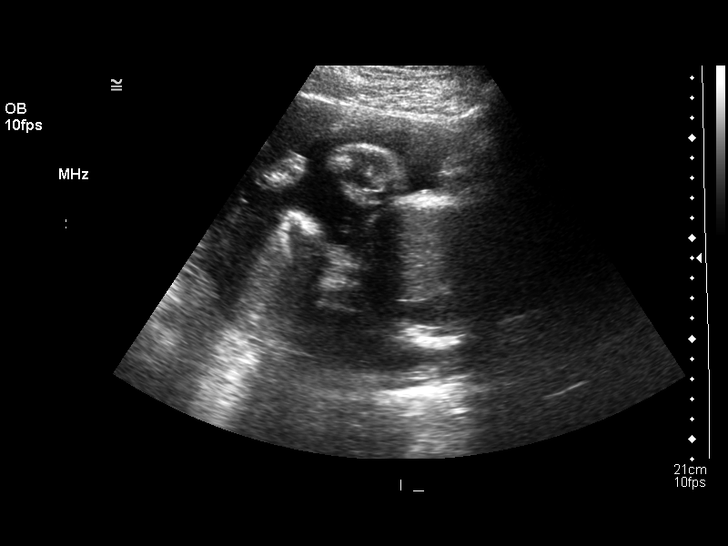
[im 16/26]
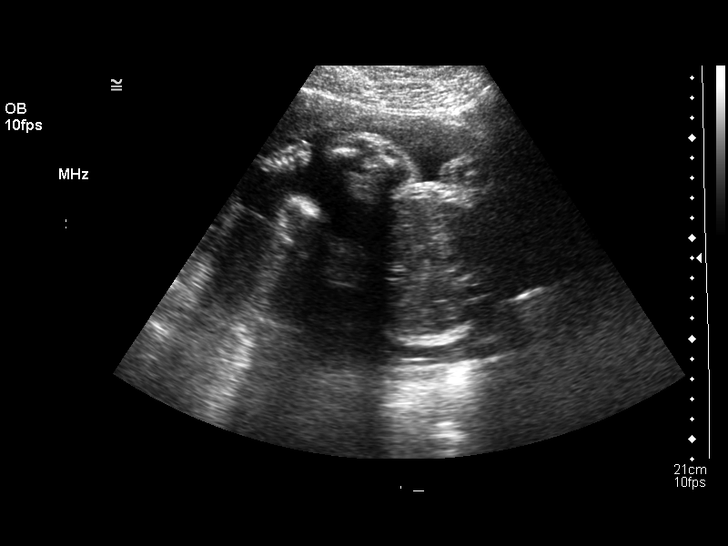
[im 18/26]
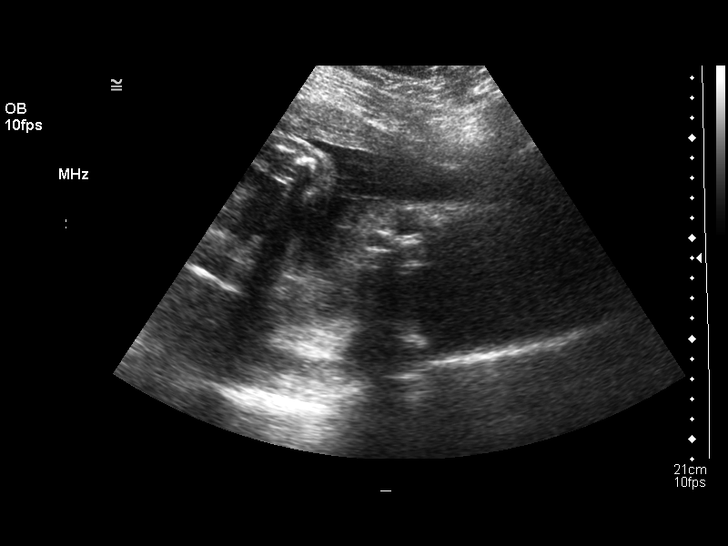
[im 20/26]
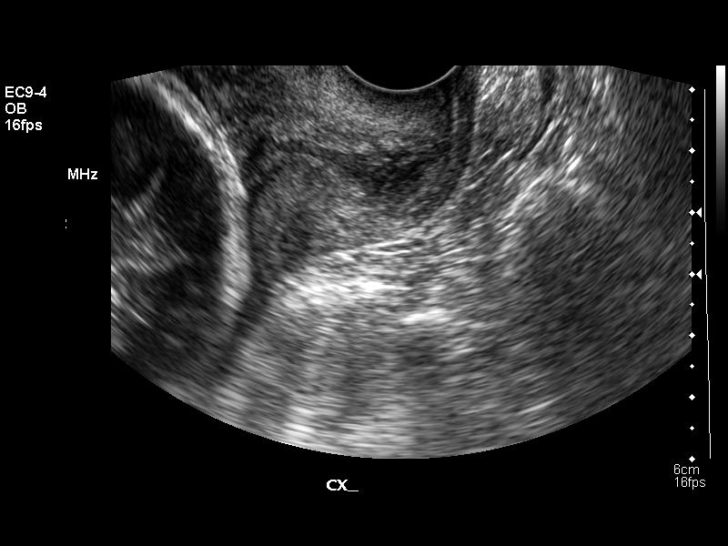
[im 22/26]
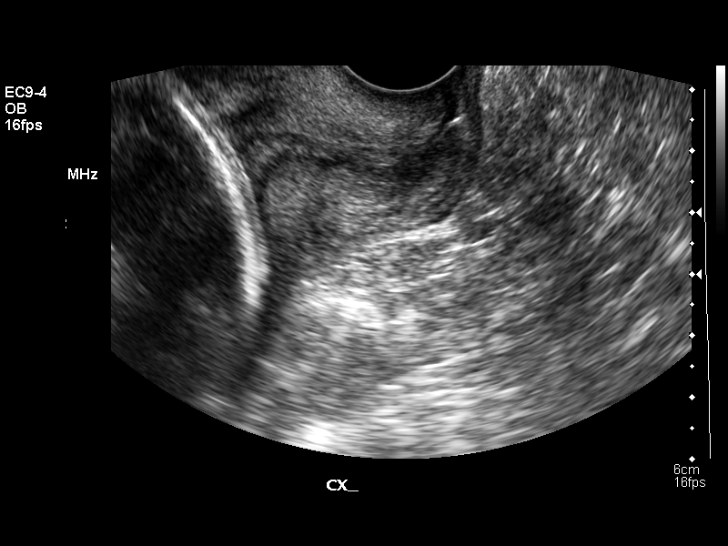
[im 24/26]
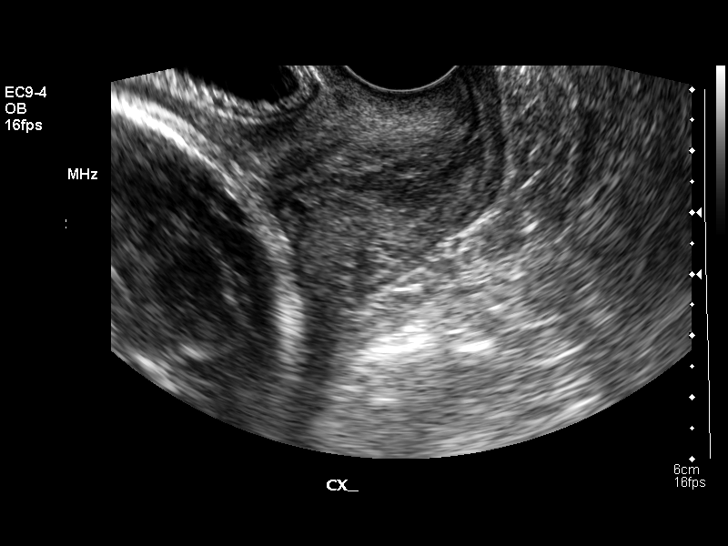
[im 26/26]
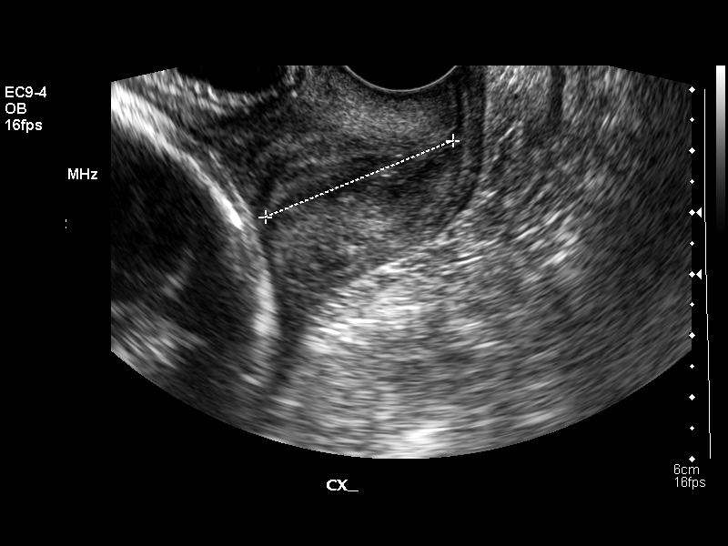

[14 of 26 positions shown; findings below may reference images not displayed]

IMPRESSION: Single live intrauterine gestation in cephalic presentation.  The cervix is long and closed.  Accounting for suboptimal visualization due to maternal body habitus and posterior placental position, no placental abnormality or previa is seen.

## 2007-04-18 IMAGING — US US FETAL BPP W/O NONSTRESS
1 series · 14 of 17 positions shown · non-contrast
Comparison: 03/03/06.

CLINICAL DATA: BPP.

 BIOPHYSICAL PROFILE

[Series 1: us fetal bpp w/o nonstress · 0.39mm/px · 14 of 17 slices shown]
[im 1/17]
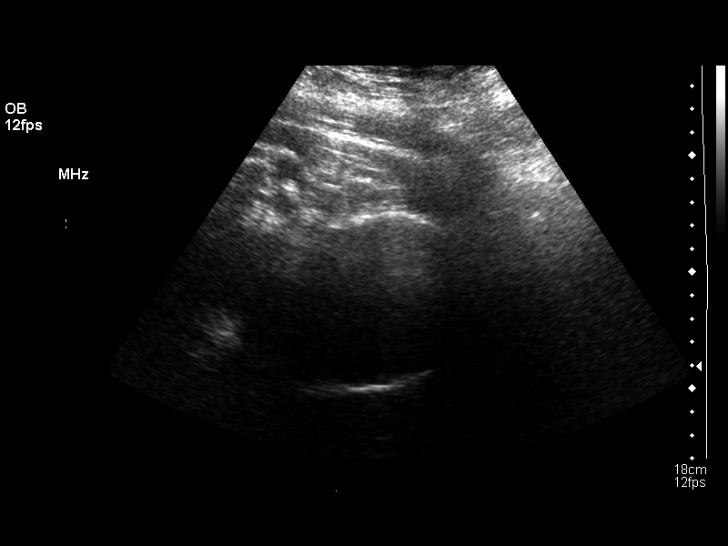
[im 2/17]
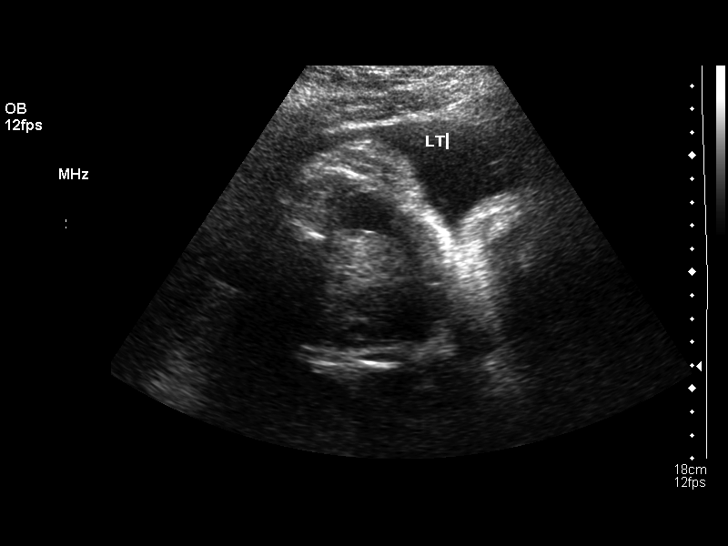
[im 4/17]
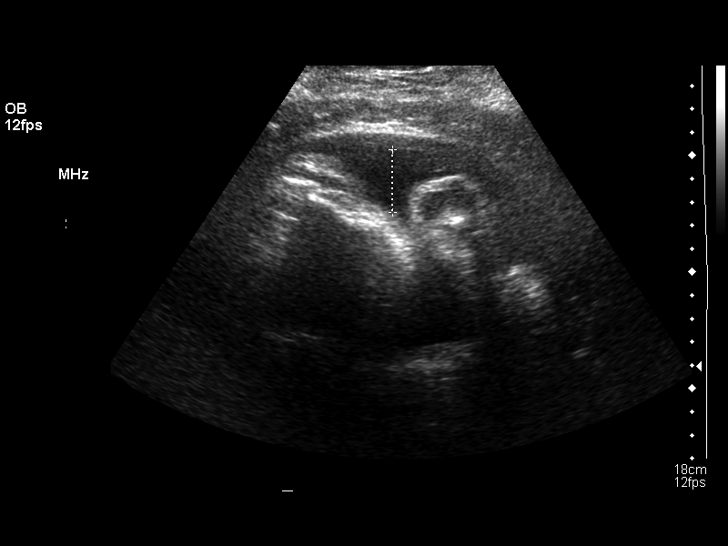
[im 5/17]
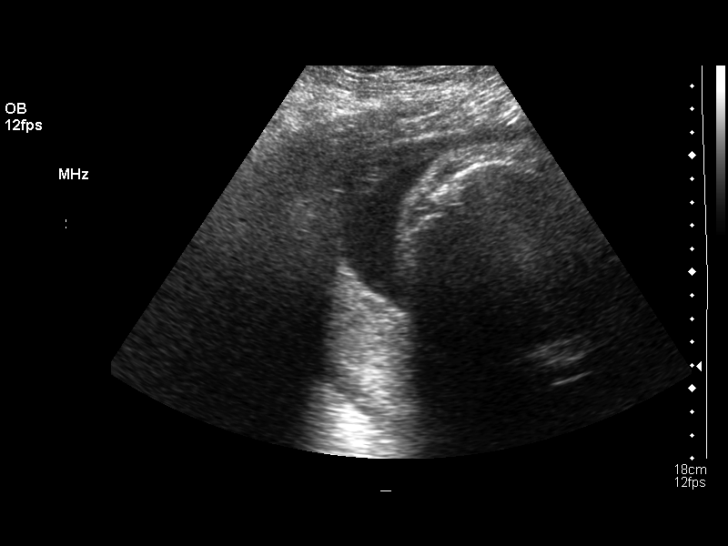
[im 6/17]
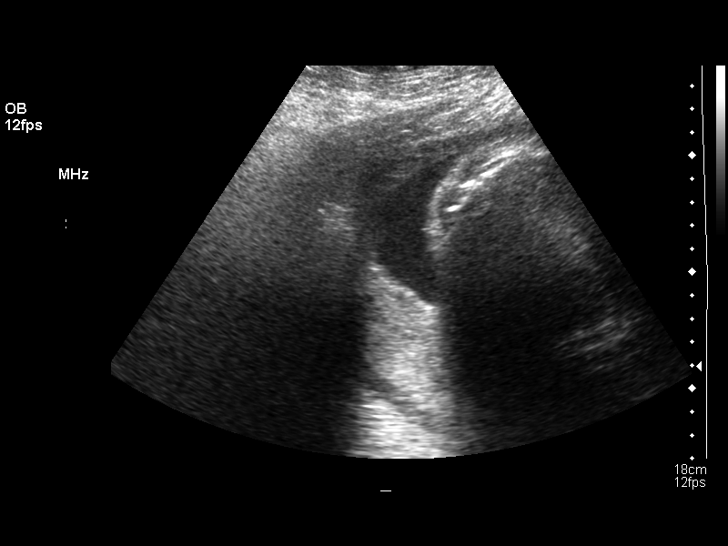
[im 7/17]
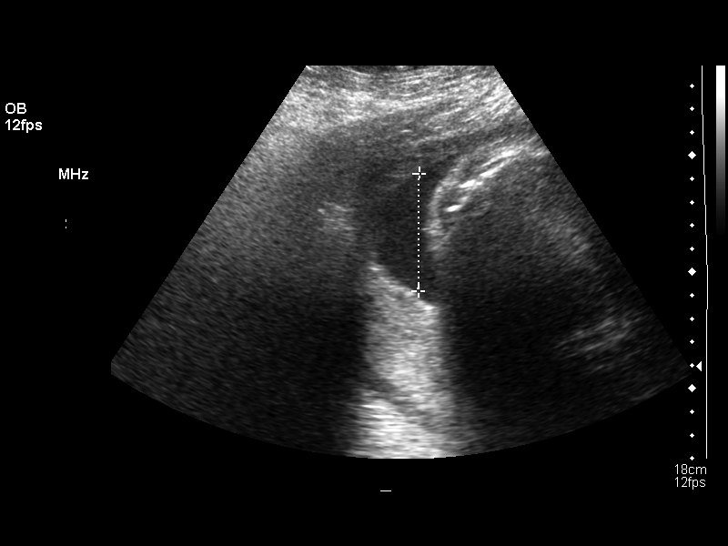
[im 8/17]
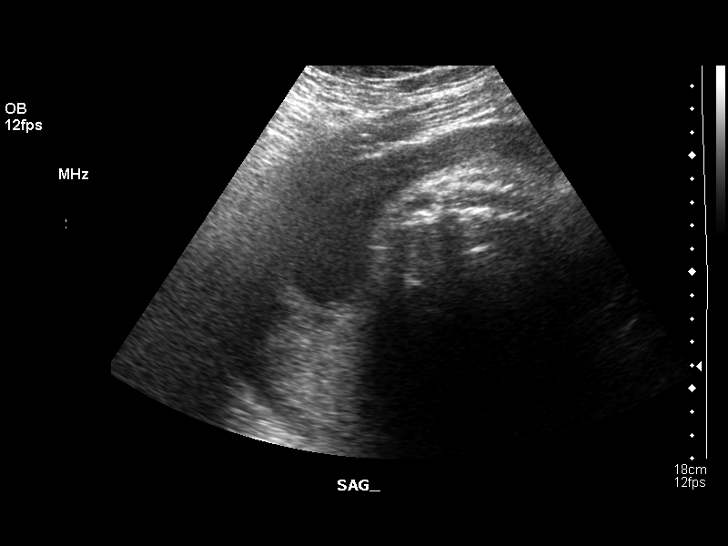
[im 10/17]
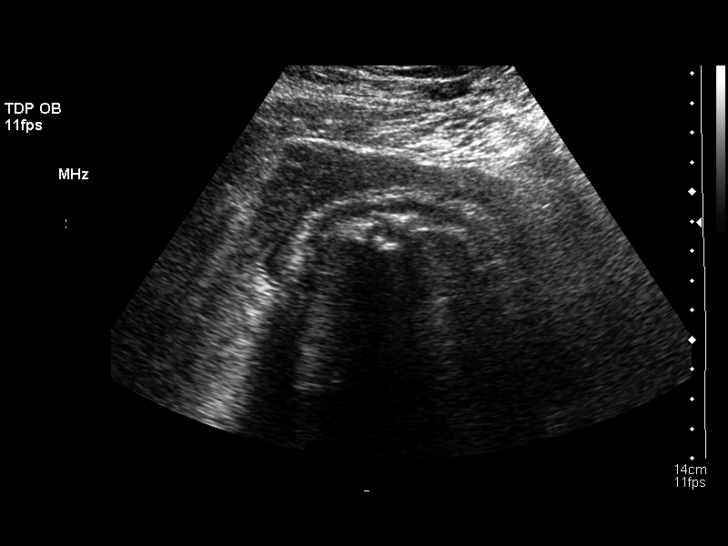
[im 11/17]
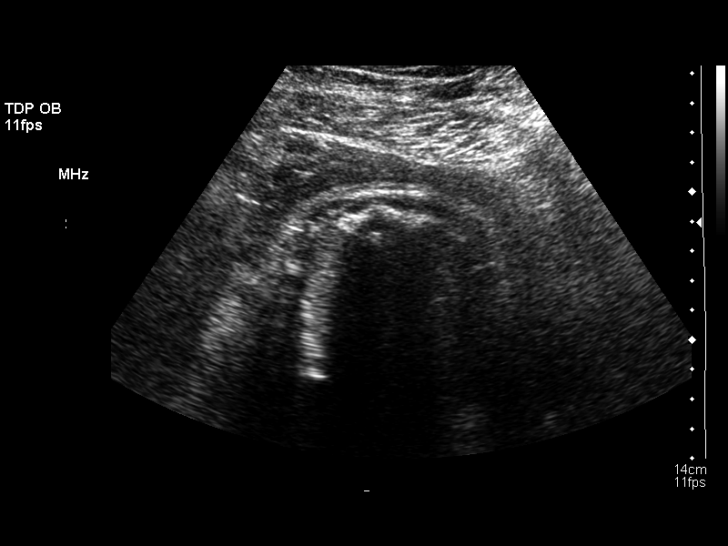
[im 12/17]
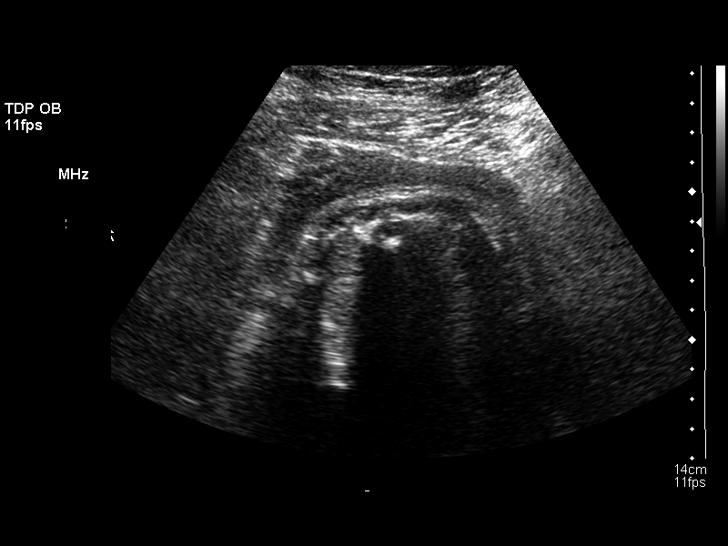
[im 13/17]
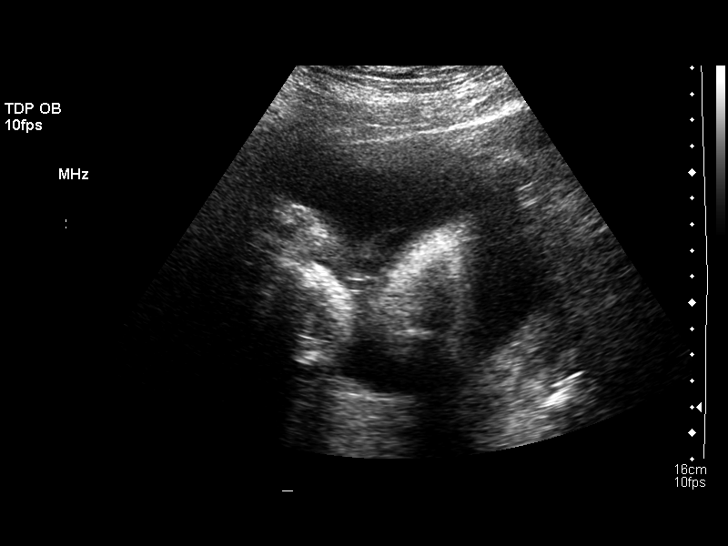
[im 14/17]
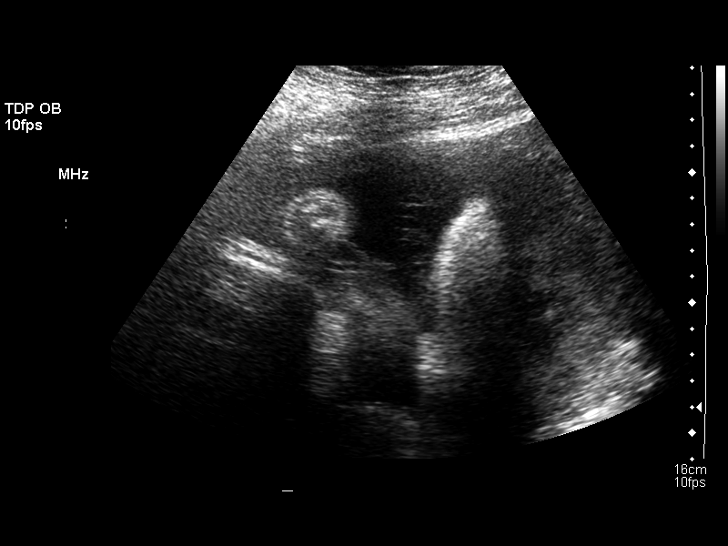
[im 16/17]
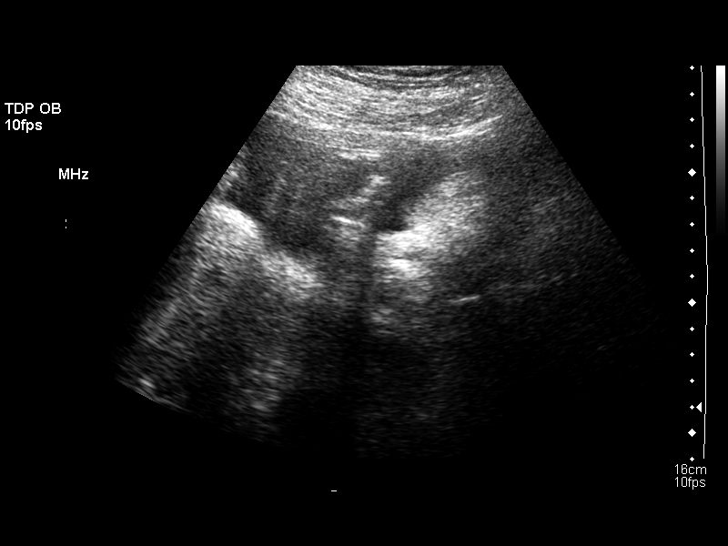
[im 17/17]
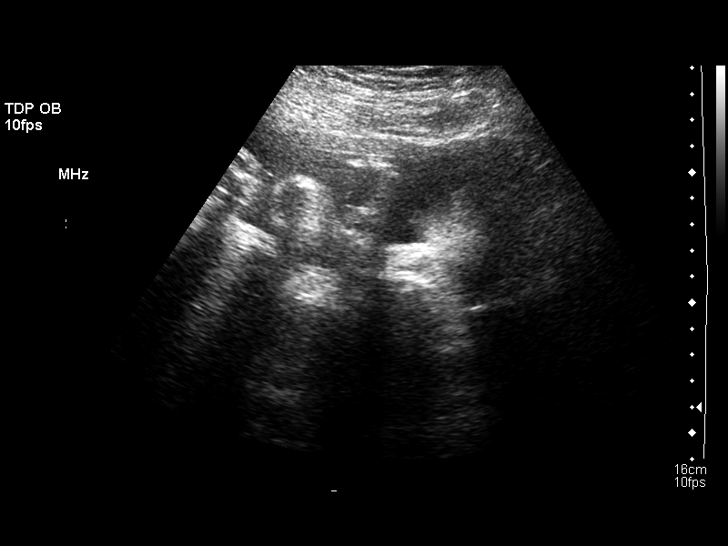

[14 of 17 positions shown; findings below may reference images not displayed]

Number of Fetuses:  1
 Heart rate:  144
 Movement:  Yes
 Breathing:  Yes
 Presentation:  Cephalic
 Placental Location:  Posterior
 Grade:  II
 Previa:  No
 Amniotic Fluid (Subjective): Normal
 Amniotic Fluid (Objective):  5.0 cm Vertical pocket 

 Fetal measurements and complete anatomic evaluation were not requested.  The following fetal anatomy was visualized on this exam: Stomach and kidneys.  

 BPP SCORING
 Movements:  2  Time:  20 minutes
 Breathing:  2
 Tone:  2
 Amniotic Fluid:  2
 Total Score:  8

 MATERNAL UTERINE AND ADNEXAL FINDINGS
 Cervix:  Not evaluated.
IMPRESSION: 1.  Single living intrauterine fetus in cephalic presentation with subjectively normal amniotic fluid volume.  Given technical factors related to the patient?s large body habitus, AFI was not able to be reliably obtained.  Subjectively the fluid volume is normal and deepest pocket is measured at least 2 cm.  
 2.  Biophysical profile score is [DATE] after 20 minutes of observation.  
 3.  The exam is limited by the large maternal habitus.

## 2007-04-30 IMAGING — US US OB COMP +14 WK
1 series · 13 of 25 positions shown · non-contrast
Comparison: none

CLINICAL DATA: Hypertension.  Assess growth.

[Series 1: us ob comp +14 wk · 0.39mm/px · 13 of 25 slices shown]
[im 1/25]
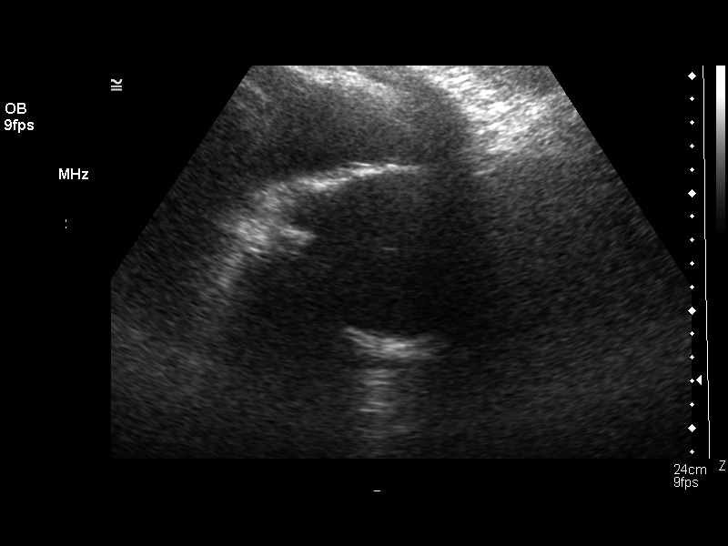
[im 3/25]
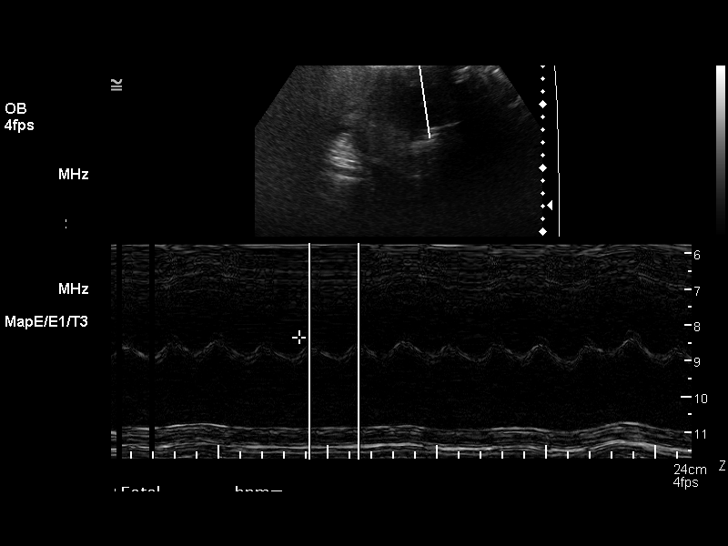
[im 5/25]
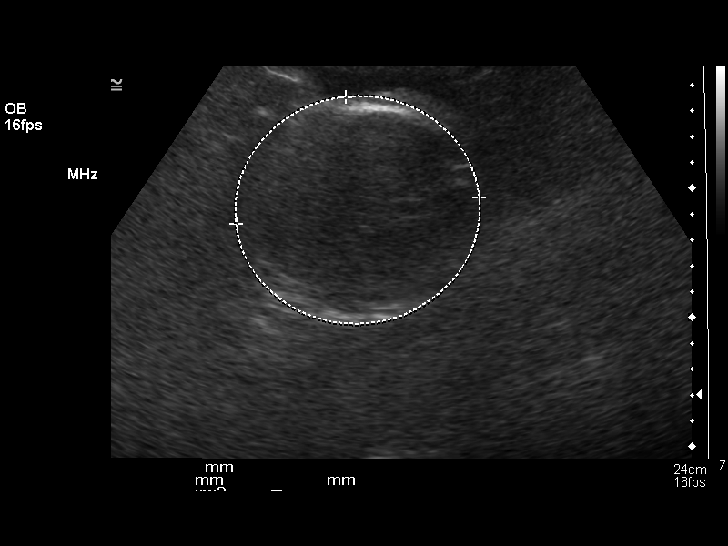
[im 7/25]
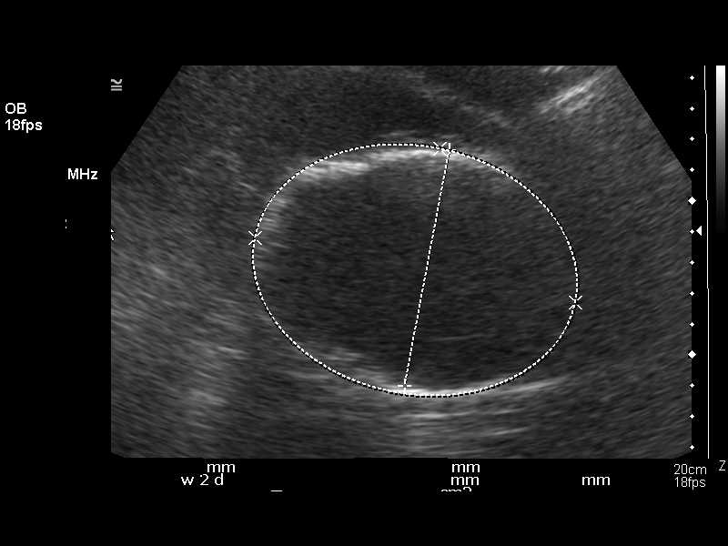
[im 9/25]
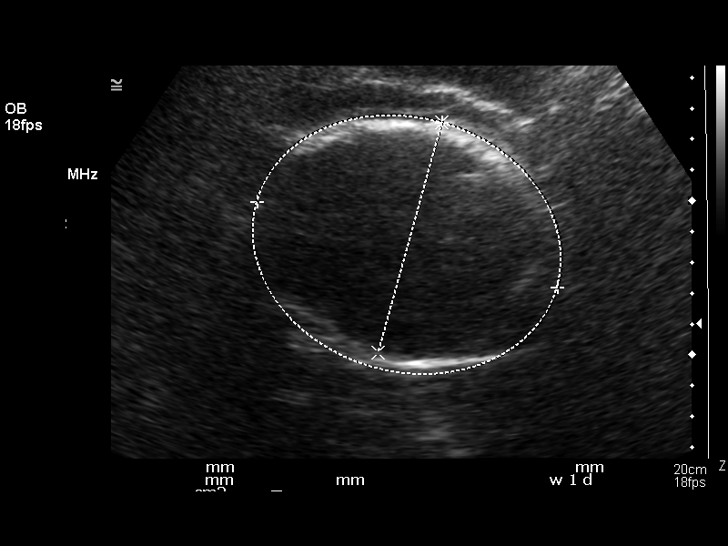
[im 11/25]
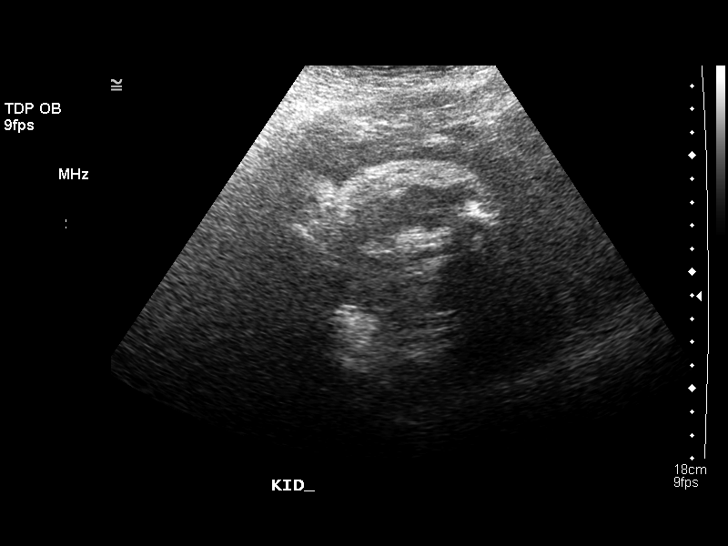
[im 13/25]
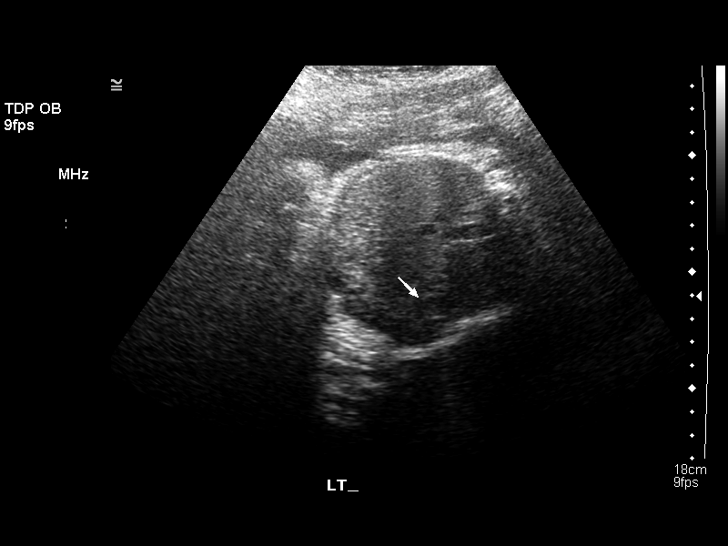
[im 15/25]
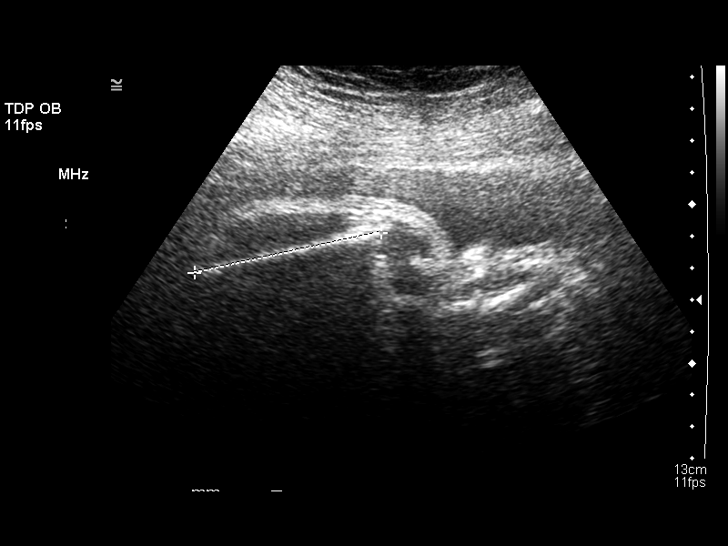
[im 17/25]
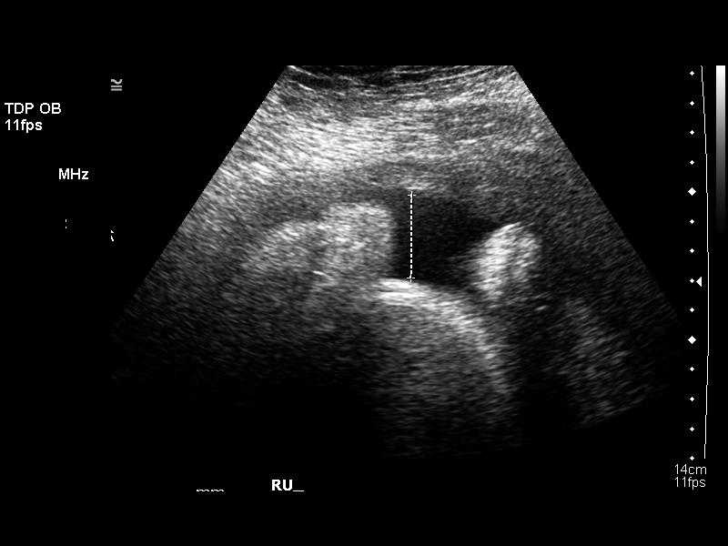
[im 19/25]
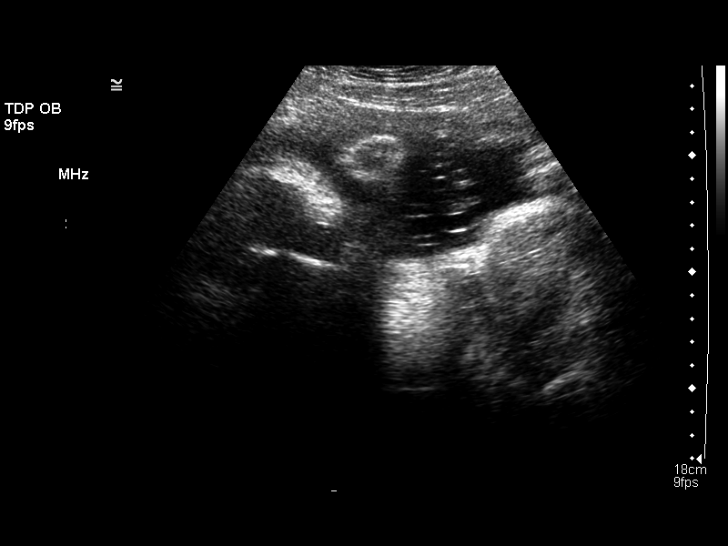
[im 21/25]
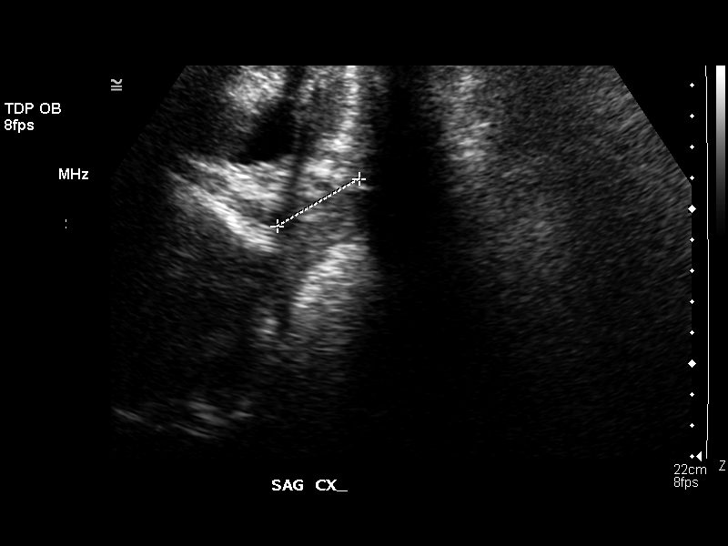
[im 23/25]
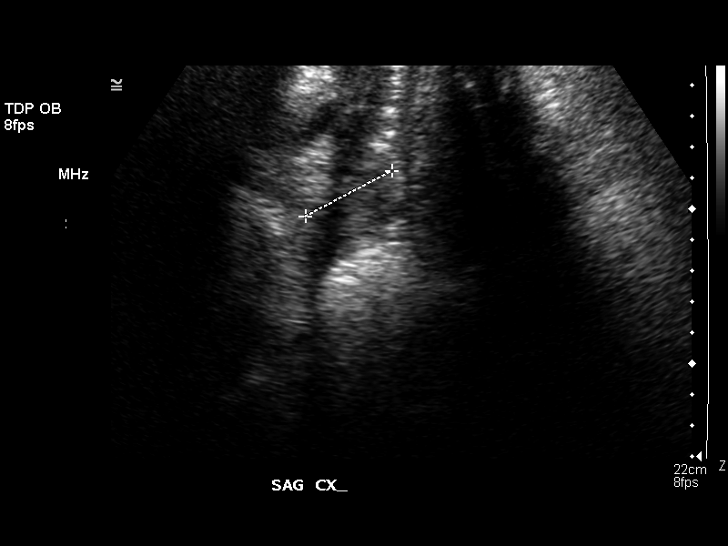
[im 25/25]
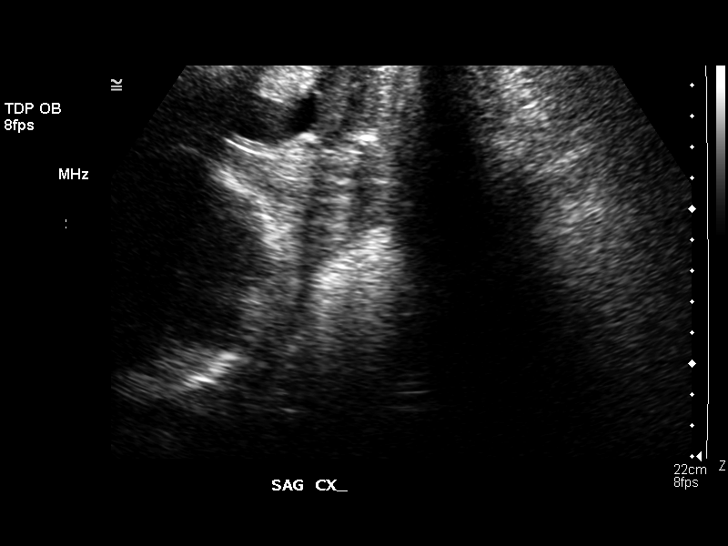

[13 of 25 positions shown; findings below may reference images not displayed]

OBSTETRICAL ULTRASOUND:
Number of Fetuses: 1
Heart Rate: 133
Movement:  Yes
Breathing:  Yes  
Presentation:  Cephalic
Placental Location:  Posterior
Grade: II
Previa:  No
Amniotic Fluid (Subjective):  Normal
Amniotic Fluid (Objective):   2.8 cm Vertical pocket 

FETAL BIOMETRY
BPD:  7.7 cm   31 w 1 d
HC:  29.1 cm  32 w 1 d
AC:  28.7 cm  32 w 5 d
FL:    6.0 cm  31 w 2 d

MEAN GA:  31 w 6 d  US EDC:  05/17/06
Fetal indices are within normal limits.
EFW: 3593 g (H) 50th ? 75th%ile (9789 ? 6365 g) For 31 wks

FETAL ANATOMY
Lateral Ventricles:    Not visualized 
Thalami/CSP:      Previously seen 
Posterior Fossa:  Previously seen 
Nuchal Region:    N/A
Spine:      Not visualized 
4 Chamber Heart on Left:      Not visualized 
Stomach on Left:      Visualized 
3 Vessel Cord:    Visualized 
Cord Insertion site:    Previously seen 
Kidneys:  Visualized 
Bladder:  Visualized 
Extremities:      Previously seen 

Evaluation limited by:  Maternal habitus

MATERNAL UTERINE AND ADNEXAL FINDINGS
Cervix: 3.2 cm Transabdominally
IMPRESSION: 1.  Single intrauterine pregnancy demonstrating an estimated gestational age by ultrasound of 31 weeks and 6 days.  This is 1 week and 2 days behind assigned gestational age of 33 weeks and 1 day.  Currently the estimated fetal weight is just below the 50th percentile for a 33 week gestation.  On the prior exam performed at 31 weeks the estimated fetal weight was just above the 50th percentile correlating with appropriate linear growth.
2.  Subjectively and quantitatively normal amniotic fluid volume and normal cervical length.  An amniotic fluid index was not performed and only a single pocket measurement was performed as an accurate amniotic fluid index could not be obtained in this patient with morbid obesity.
3.  No late developing fetal anatomic abnormalities are identified associated with the stomach, kidneys, or bladder.  A four chamber heart view and lateral ventricles have not been seen previously and could not be seen today.  The fetal spine and remaining cardiac anatomy, as well as the 5th digit have not been seen previously due to poor scanning visibility and these could not be seen today.

## 2007-05-15 IMAGING — US US FETAL BPP W/O NONSTRESS
1 series · 4 of 4 positions shown · non-contrast
Comparison: none

CLINICAL DATA: 35 week 2 day assigned gestational age.  Nonreactive nonstress test and pregnancy-induced hypertension.  Morbid obesity with weight of approximately 400 pounds.

[Series 1: us fetal bpp w/o nonstress · 0.49mm/px · 4 of 4 slices shown]
[im 1/4]
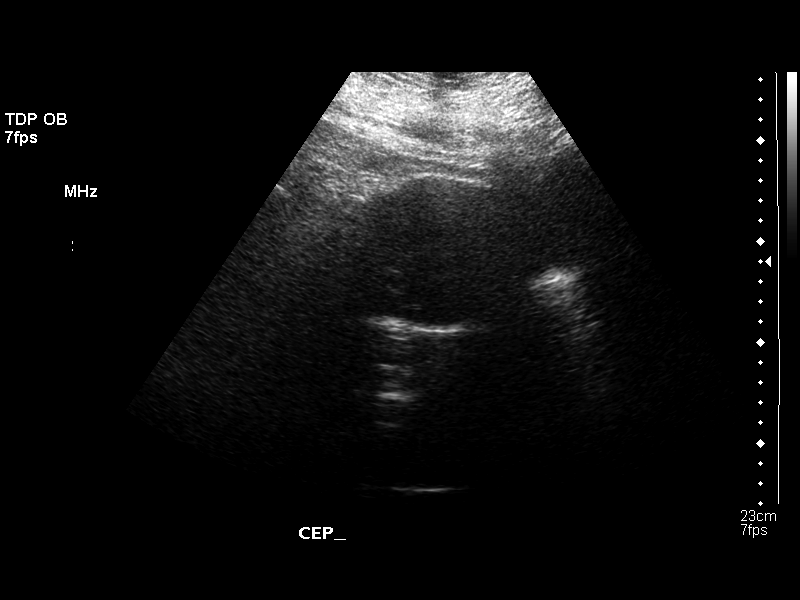
[im 2/4]
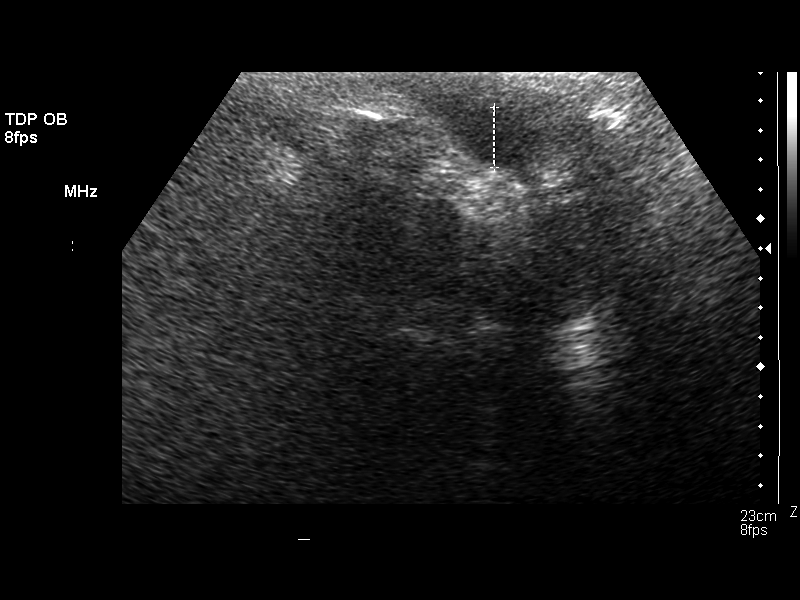
[im 3/4]
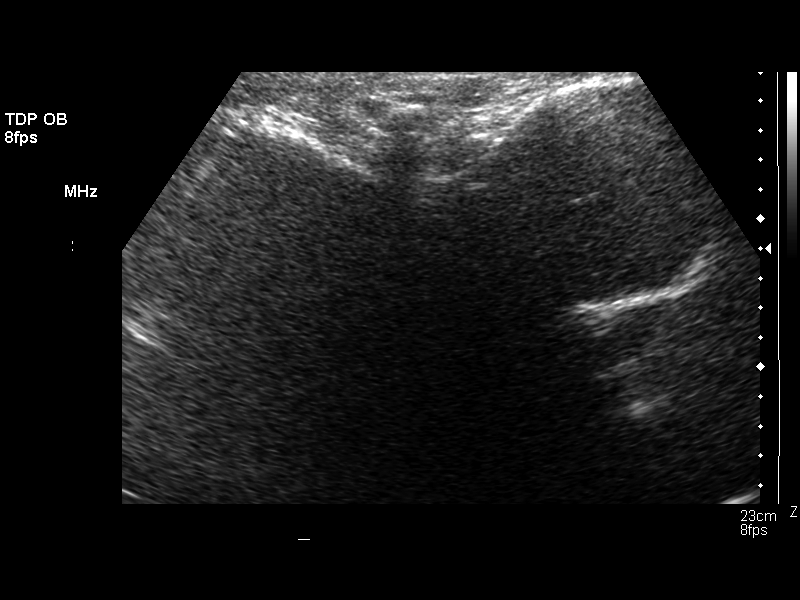
[im 4/4]
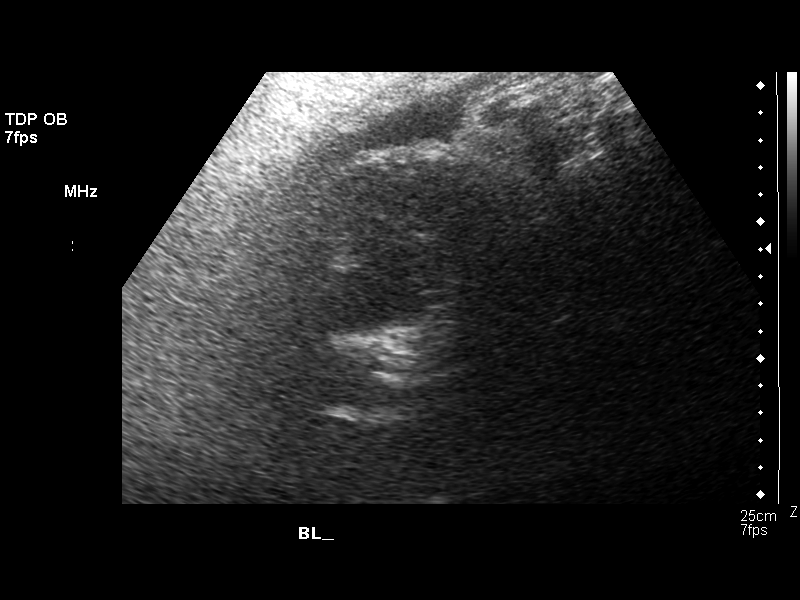

[4 of 4 positions shown; findings below may reference images not displayed]

BIOPHYSICAL PROFILE:

This study was extremely limited and technically difficult due to patient?s large habitus.  A single living intrauterine gestation is seen in cephalic presentation with measured heart of 153. Placenta is posterior in location.  At least one vertical pocket of amniotic fluid is seen measuring greater than 2 cm.  The study was only performed for a period of 10 minutes, while manually lifting the patient?s abdominal pannus during this time to improve visualization.  No fetal movement was seen during this 10-minute period, though study could not be carried [DATE] minutes.  Breathing and tone were not visualized during this brief time, but evaluation was limited in these areas as well, as described previously.
IMPRESSION: Technically limited study as described above.   Single living intrauterine fetus is seen in cephalic presentation.  At least one greater than 2 cm pocket of amniotic fluid is noted.  Remainder of biophysical profile could not be performed completely, as described above.

## 2007-05-29 ENCOUNTER — Emergency Department (HOSPITAL_COMMUNITY): Admission: EM | Admit: 2007-05-29 | Discharge: 2007-05-29 | Payer: Self-pay | Admitting: Emergency Medicine

## 2007-08-08 ENCOUNTER — Emergency Department (HOSPITAL_COMMUNITY): Admission: EM | Admit: 2007-08-08 | Discharge: 2007-08-08 | Payer: Self-pay | Admitting: Emergency Medicine

## 2007-08-14 IMAGING — CR DG ANKLE COMPLETE 3+V*L*
3 series · 3 of 3 positions shown · non-contrast
Comparison: 12/01/05.
COMPARISON: 03/04/05.

CLINICAL DATA: Status post fall, pain. 
 LEFT KNEE ? 4 VIEW:

[view not recorded (1 of 3)]
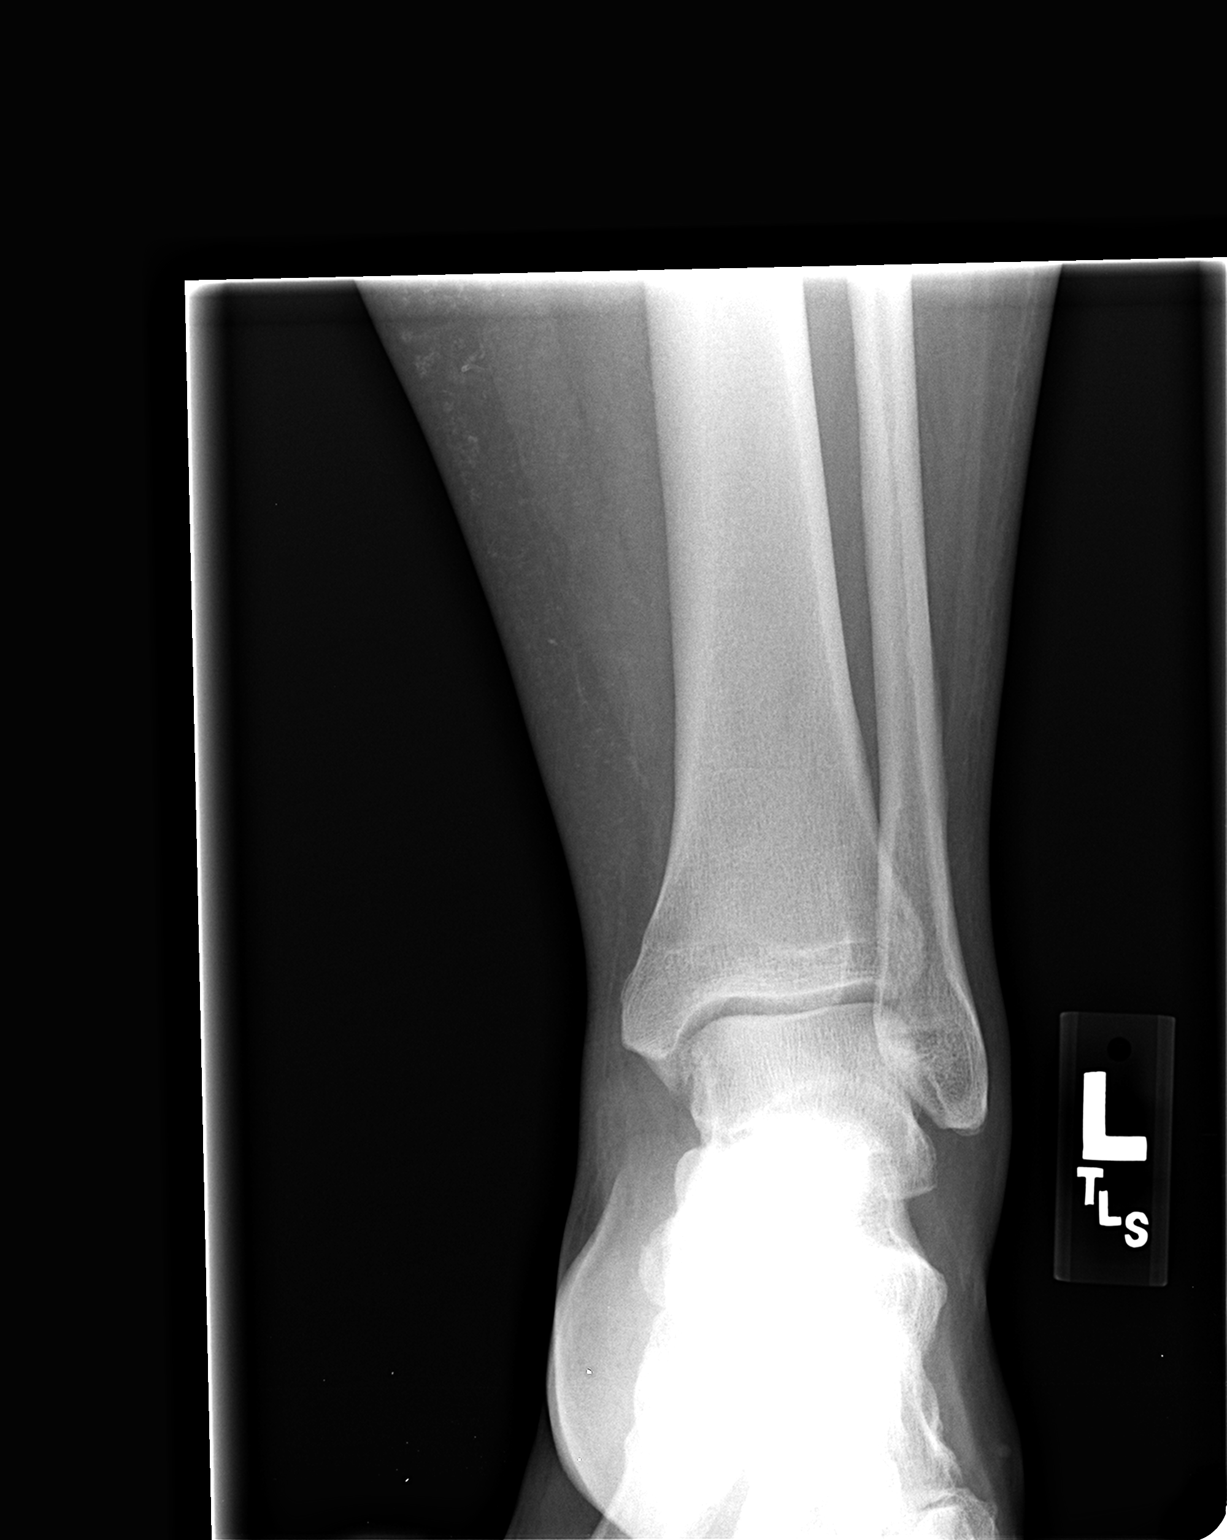

[view not recorded (2 of 3)]
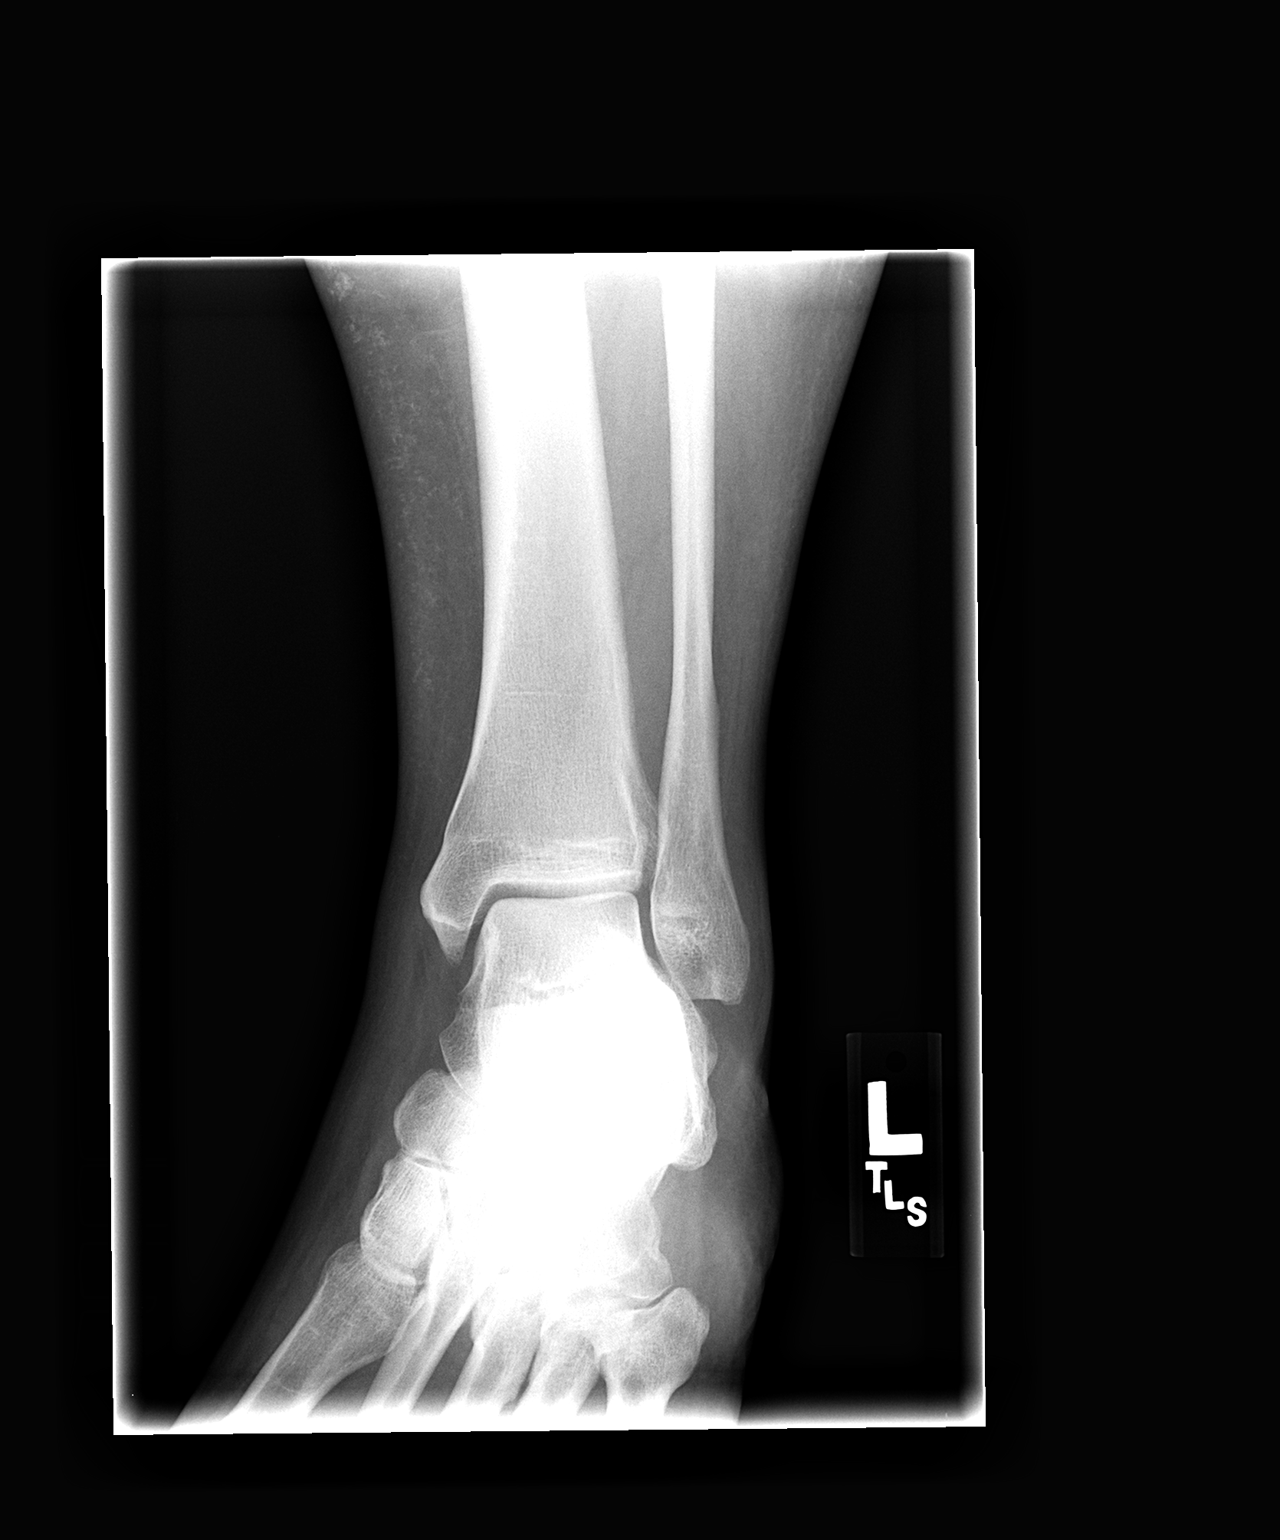

[view not recorded (3 of 3)]
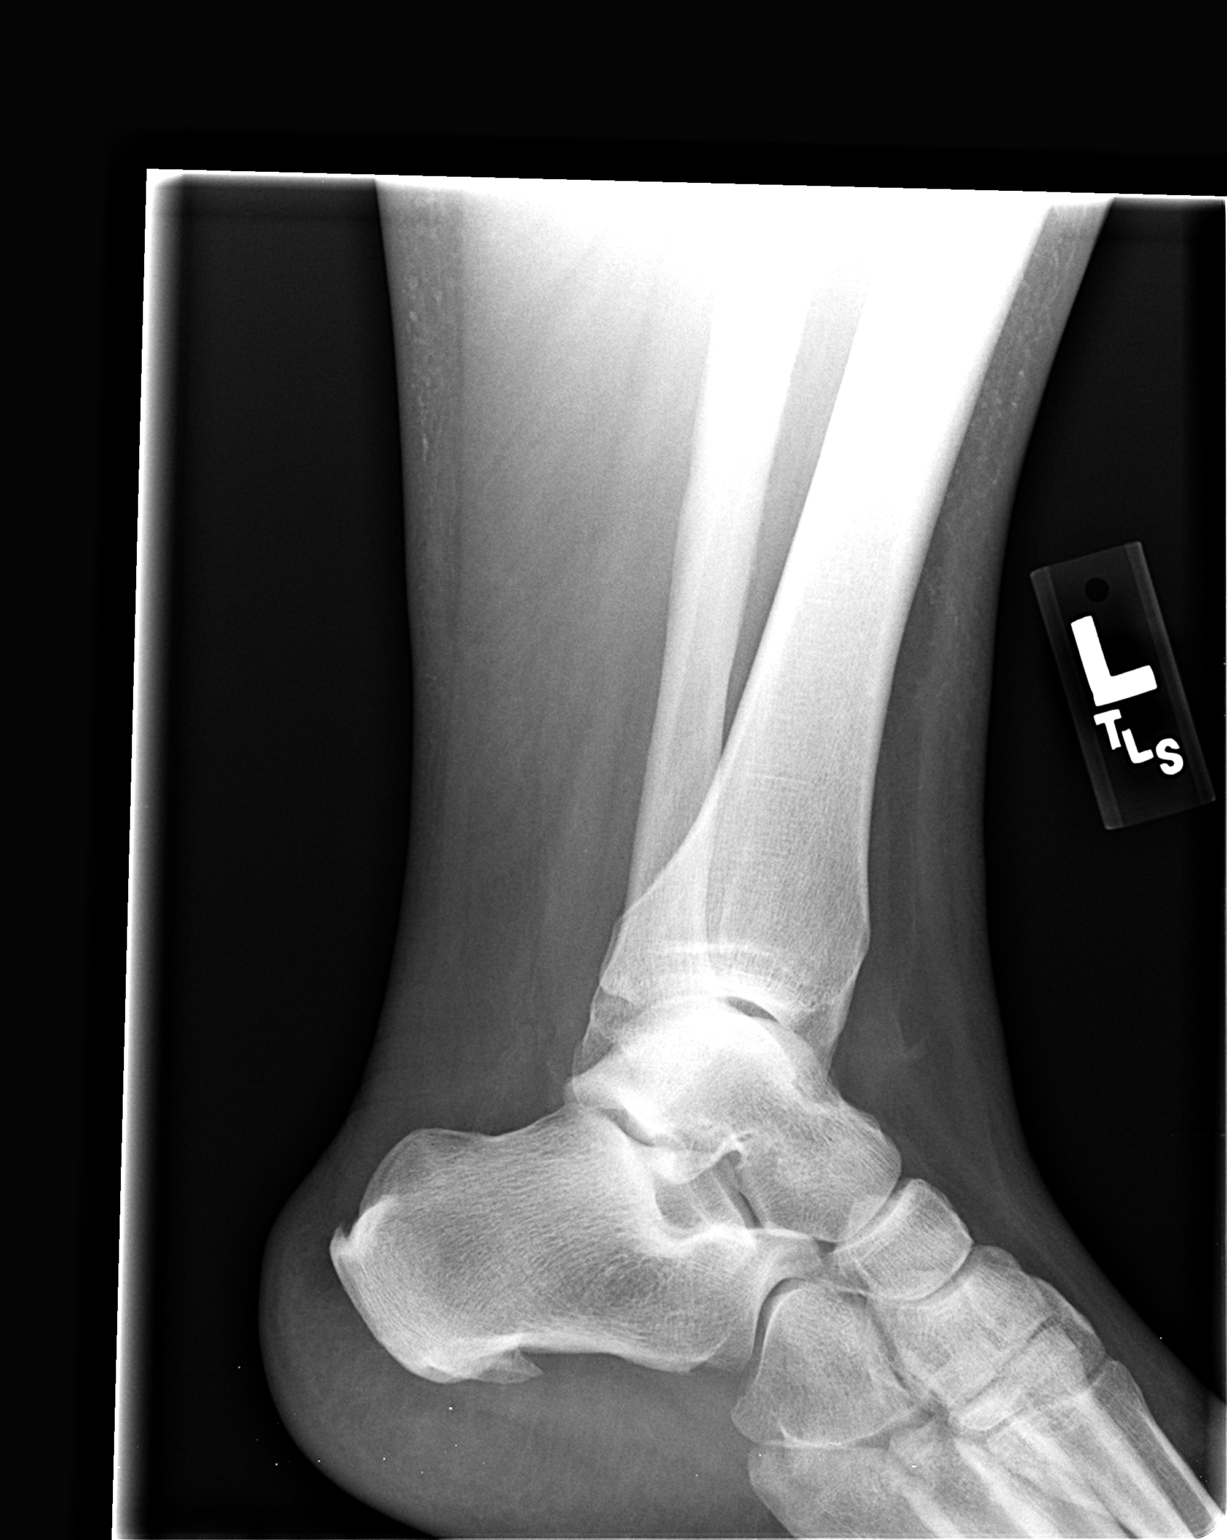

[3 of 3 positions shown; findings below may reference images not displayed]

FINDINGS: Again seen is a bone spur on the lateral tibial plateau.  No fracture, dislocation or joint effusion is identified.  There is no marked degenerative disease.
IMPRESSION: No acute findings.
 LEFT ANKLE:
FINDINGS: There is no acute bony or joint abnormality.  Plantar and dorsal calcaneal spurs are noted.  There is some soft tissue calcifications in the subcutaneous fat noted.
IMPRESSION: No acute findings.

## 2007-08-14 IMAGING — CR DG KNEE COMPLETE 4+V*L*
4 series · 4 of 4 positions shown · non-contrast
Comparison: 12/01/05.
COMPARISON: 03/04/05.

CLINICAL DATA: Status post fall, pain. 
 LEFT KNEE ? 4 VIEW:

[view not recorded (1 of 4)]
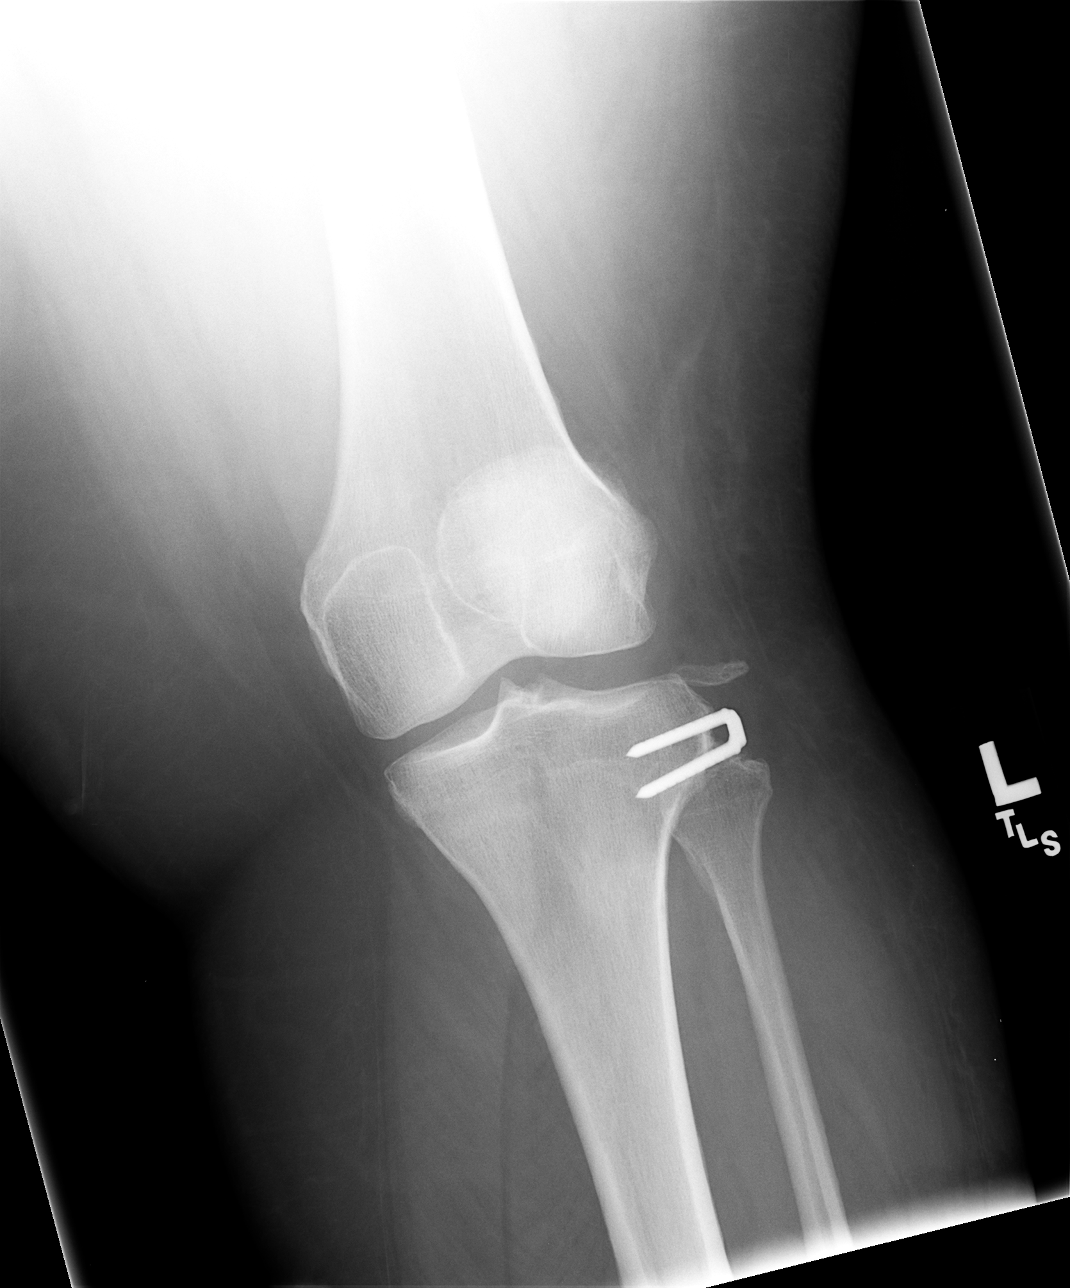

[view not recorded (2 of 4)]
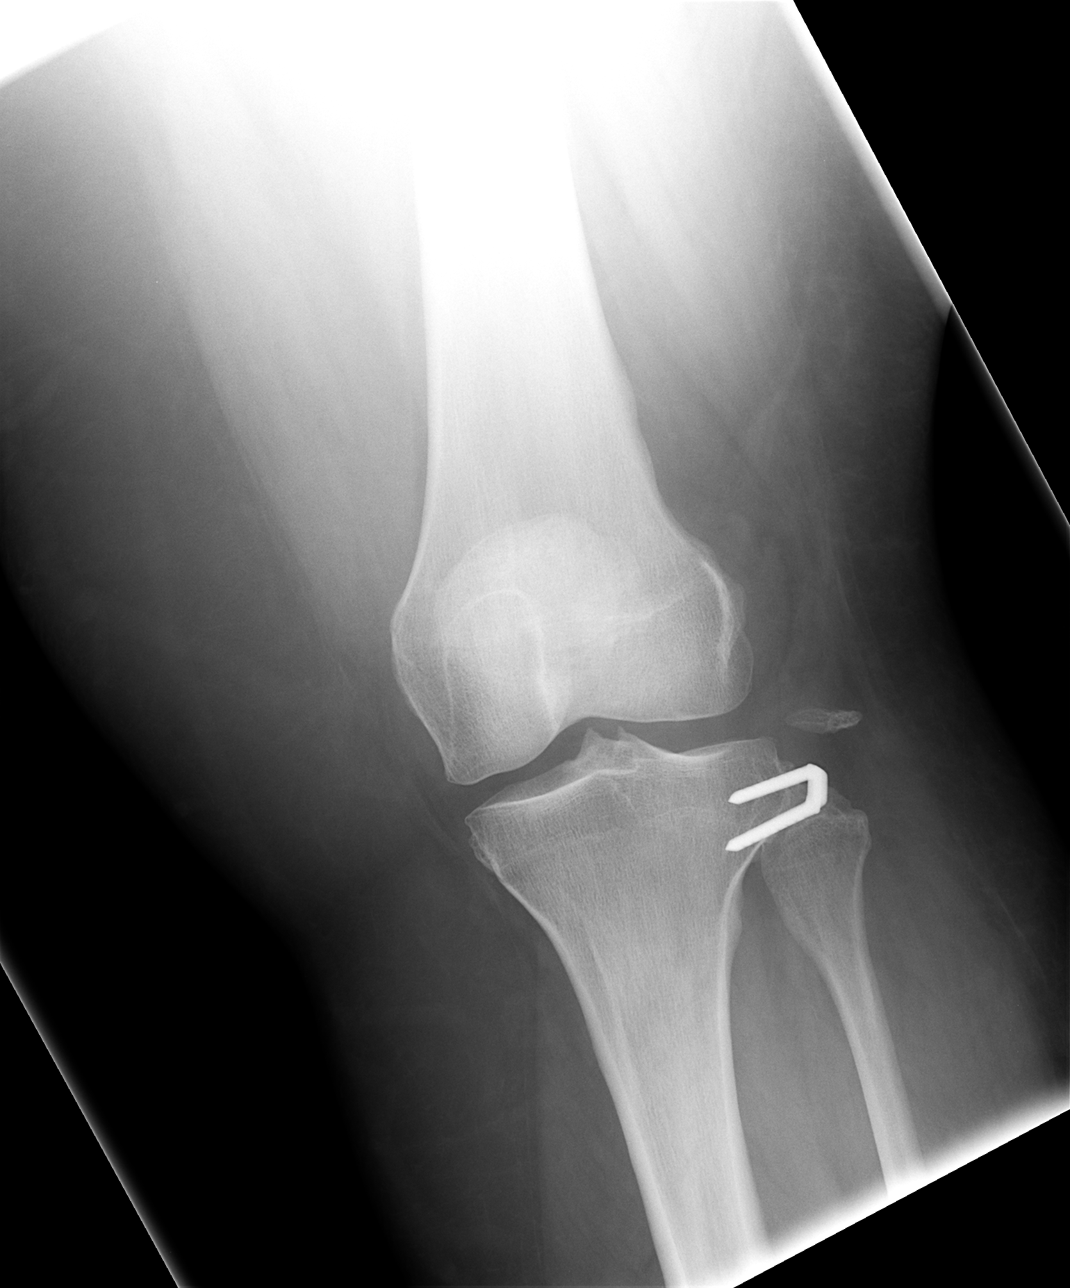

[view not recorded (3 of 4)]
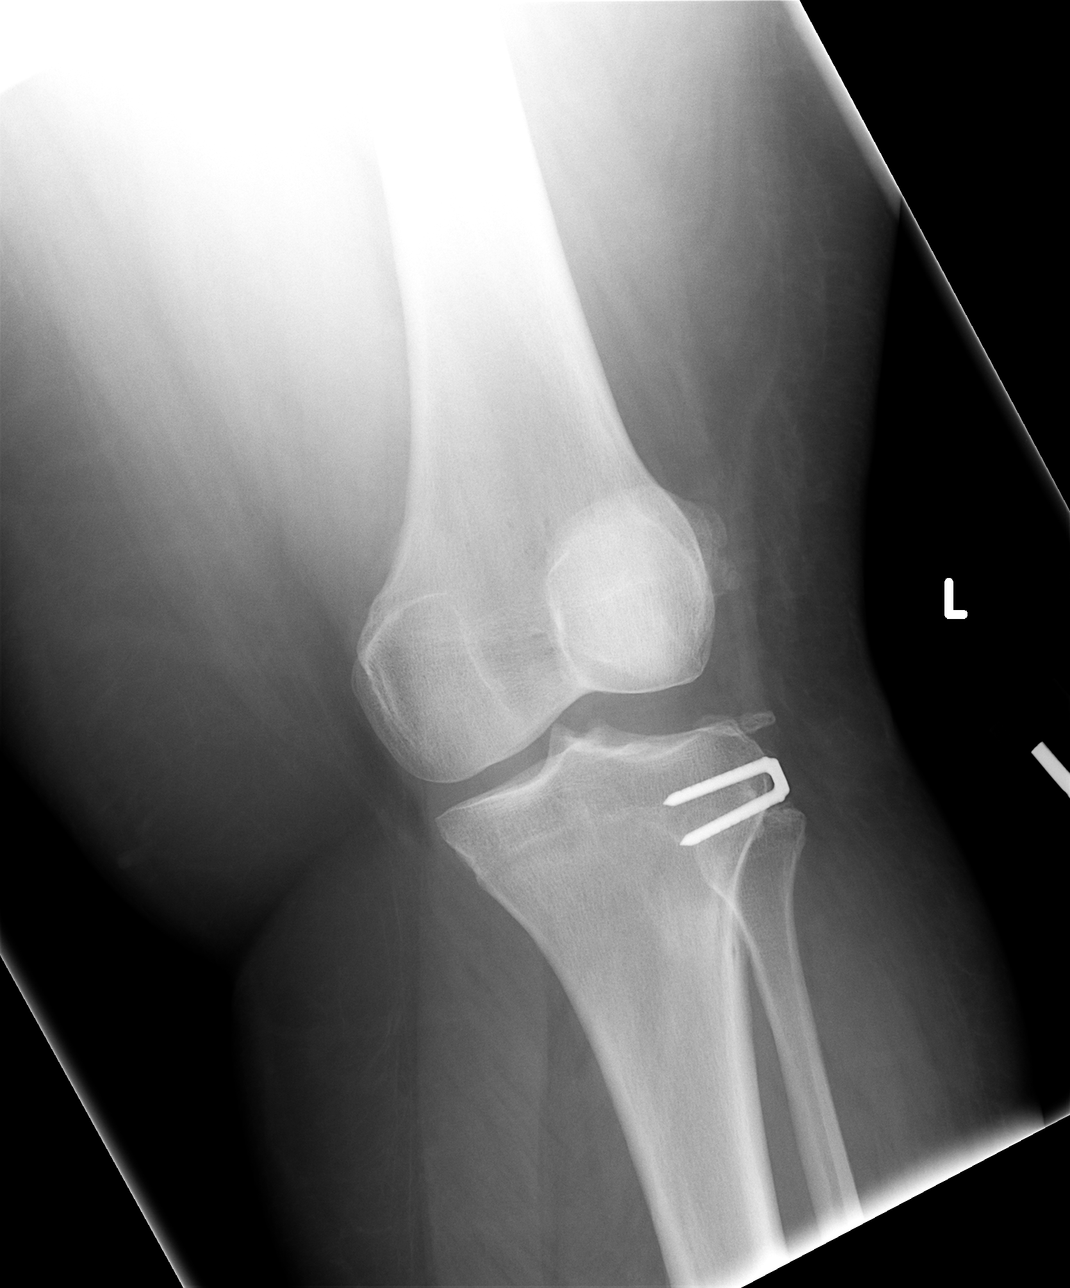

[view not recorded (4 of 4)]
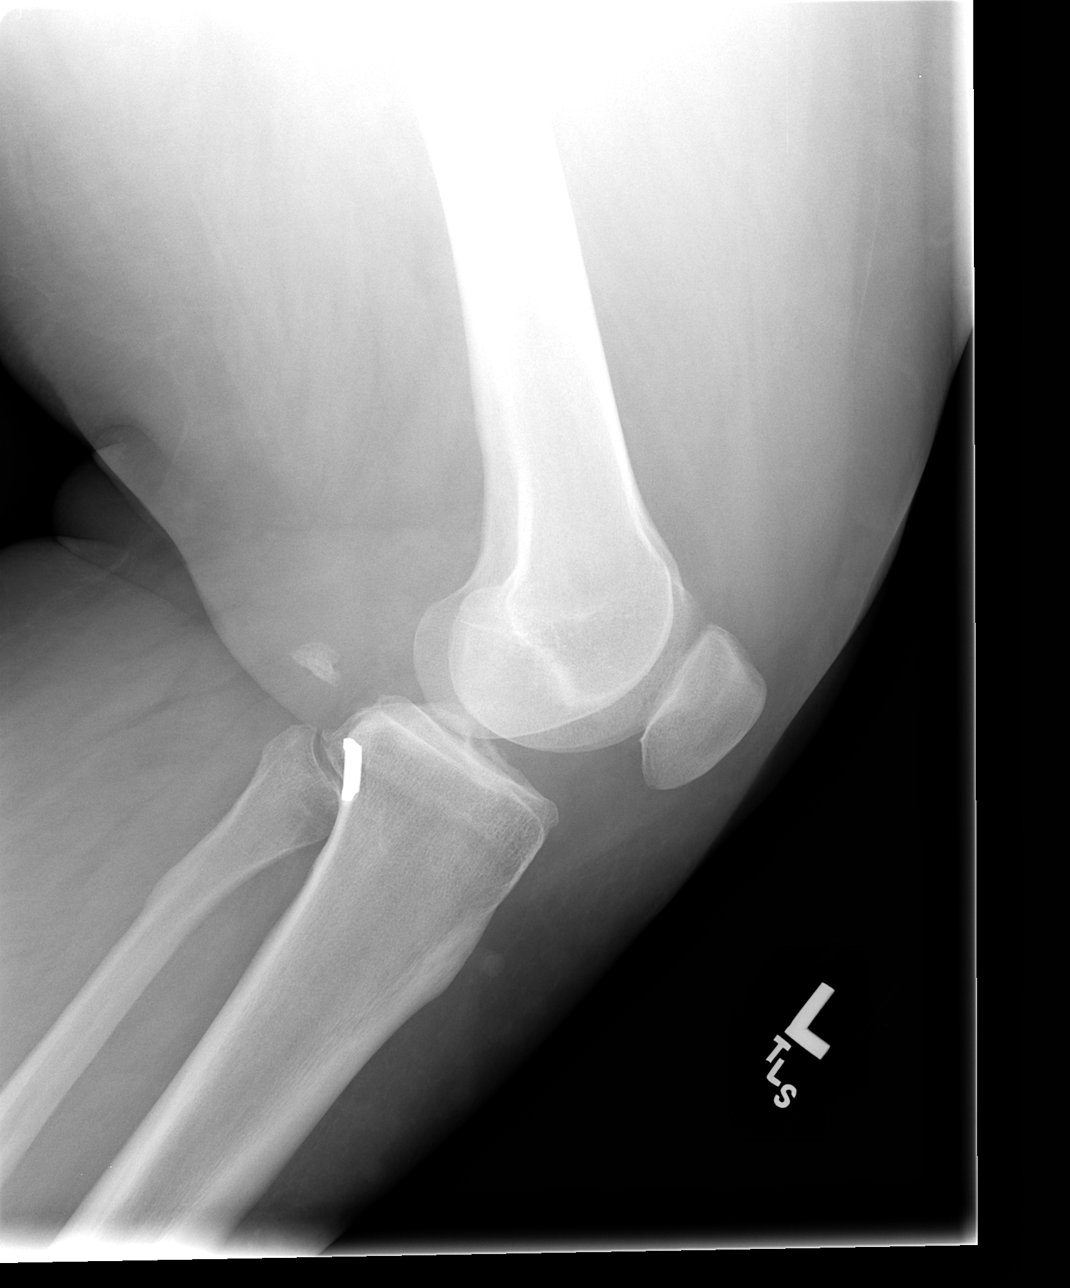

[4 of 4 positions shown; findings below may reference images not displayed]

FINDINGS: Again seen is a bone spur on the lateral tibial plateau.  No fracture, dislocation or joint effusion is identified.  There is no marked degenerative disease.
IMPRESSION: No acute findings.
 LEFT ANKLE:
FINDINGS: There is no acute bony or joint abnormality.  Plantar and dorsal calcaneal spurs are noted.  There is some soft tissue calcifications in the subcutaneous fat noted.
IMPRESSION: No acute findings.

## 2007-08-25 ENCOUNTER — Emergency Department (HOSPITAL_COMMUNITY): Admission: EM | Admit: 2007-08-25 | Discharge: 2007-08-25 | Payer: Self-pay | Admitting: Family Medicine

## 2007-09-02 ENCOUNTER — Emergency Department (HOSPITAL_COMMUNITY): Admission: EM | Admit: 2007-09-02 | Discharge: 2007-09-02 | Payer: Self-pay | Admitting: Emergency Medicine

## 2007-09-04 ENCOUNTER — Emergency Department (HOSPITAL_COMMUNITY): Admission: EM | Admit: 2007-09-04 | Discharge: 2007-09-04 | Payer: Self-pay | Admitting: Family Medicine

## 2007-10-24 ENCOUNTER — Emergency Department (HOSPITAL_COMMUNITY): Admission: EM | Admit: 2007-10-24 | Discharge: 2007-10-24 | Payer: Self-pay | Admitting: Family Medicine

## 2007-10-31 ENCOUNTER — Inpatient Hospital Stay (HOSPITAL_COMMUNITY): Admission: AD | Admit: 2007-10-31 | Discharge: 2007-10-31 | Payer: Self-pay | Admitting: Obstetrics and Gynecology

## 2008-02-02 IMAGING — CT CT ANGIO CHEST
3 of 5 series · 17 of 30 positions shown · IV contrast (120 ML OMNI 300)
Comparison: none

CLINICAL DATA: Cough, chest pain and dyspnea.
CT ANGIOGRAPHY OF CHEST:
TECHNIQUE: Multidetector CT imaging of the chest was performed during bolus injection of intravenous contrast.  Multiplanar CT angiographic image reconstructions were generated to evaluate the vascular anatomy.
Contrast:   498cc intravenous Omnipaque 300.

[Series 3: pe · axial · 0.86mm/px · z∈[-353,-100]mm · 10 of 254 slices shown]
[im 26/254  lung]
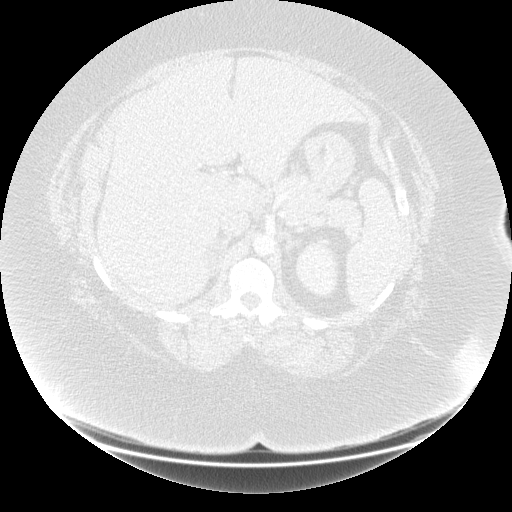
[im 51/254  mediastinal]
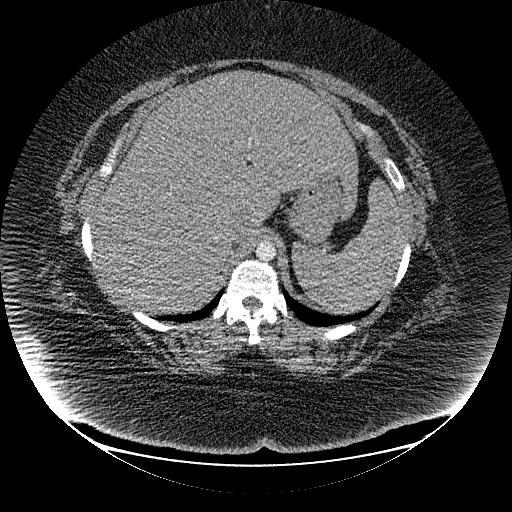
[im 76/254  lung]
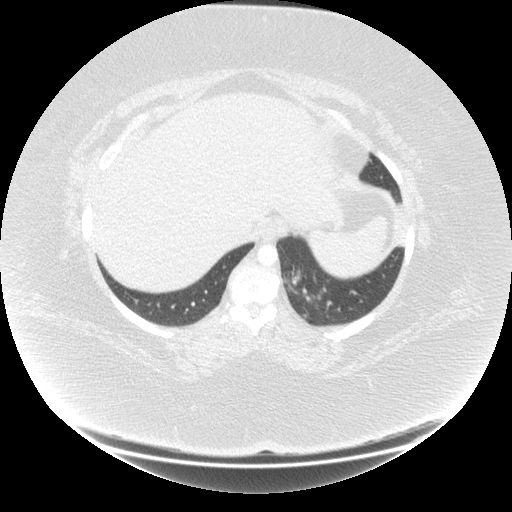
[im 102/254  mediastinal]
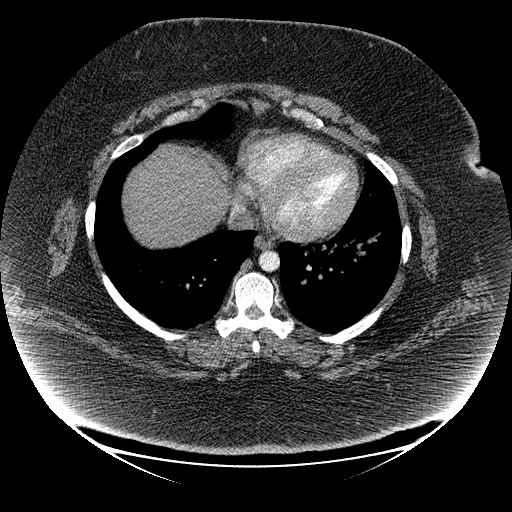
[im 127/254  lung]
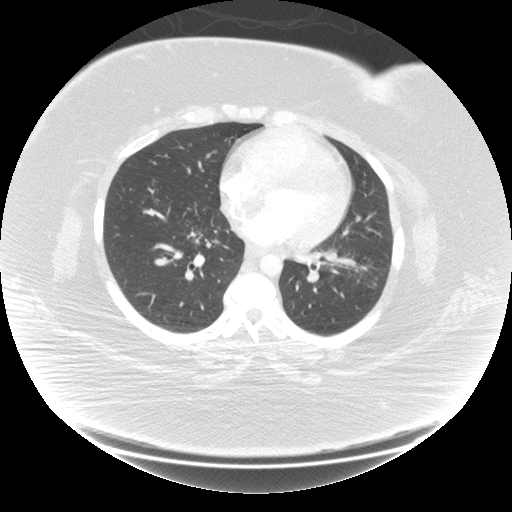
[im 145/254  mediastinal]
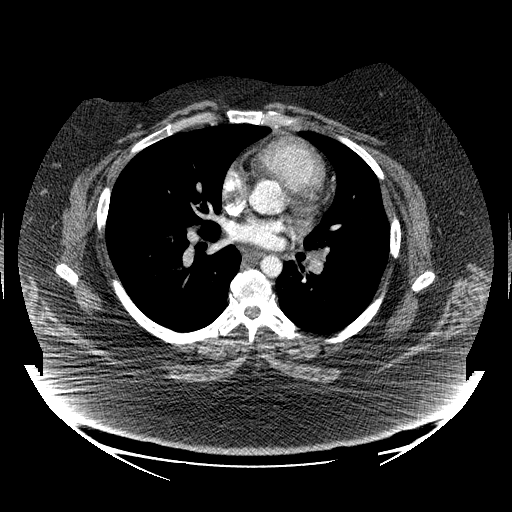
[im 152/254  lung]
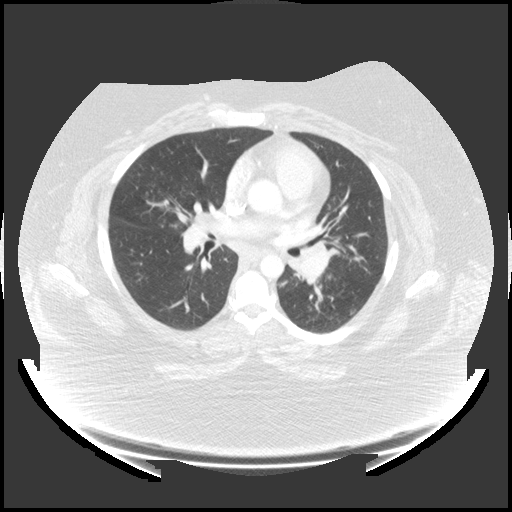
[im 178/254  mediastinal]
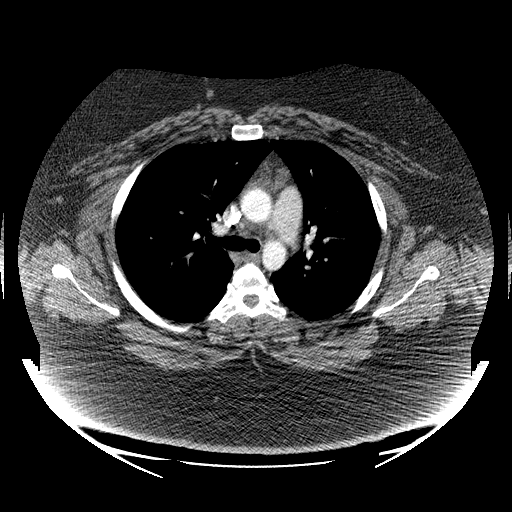
[im 203/254  lung]
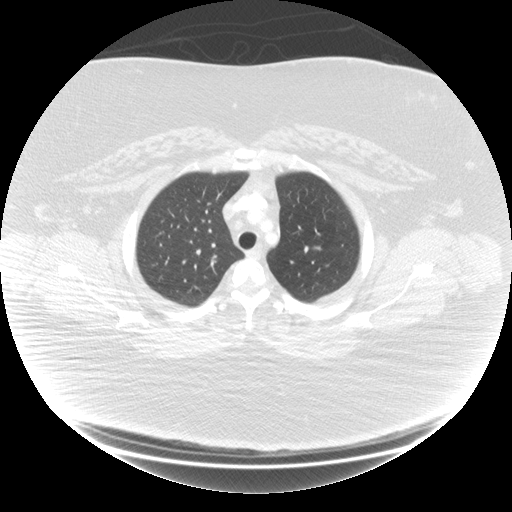
[im 228/254  mediastinal]
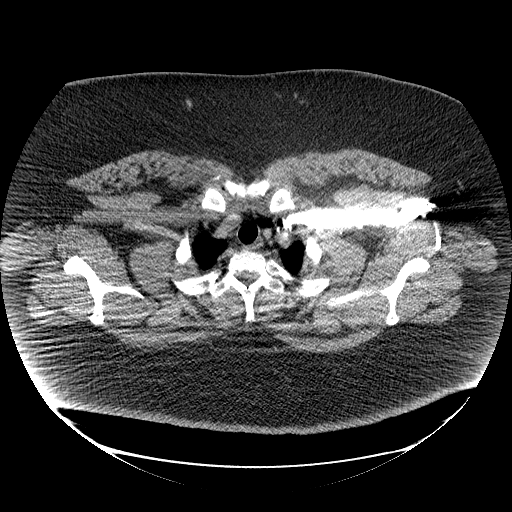

[Series 300: sagittal chest · sagittal · 0.86mm/px · 4 of 146 slices shown]
[im 30/146  lung]
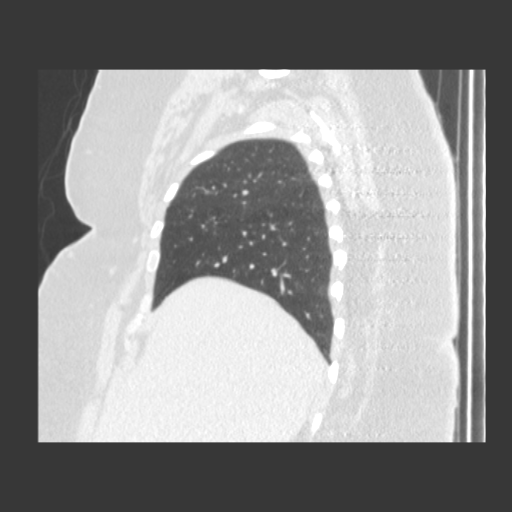
[im 59/146  lung]
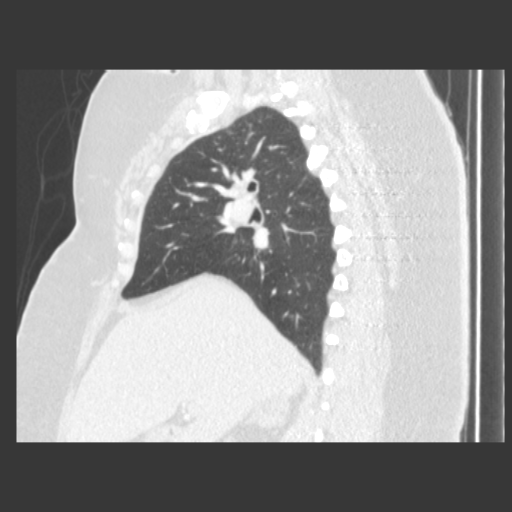
[im 88/146  lung]
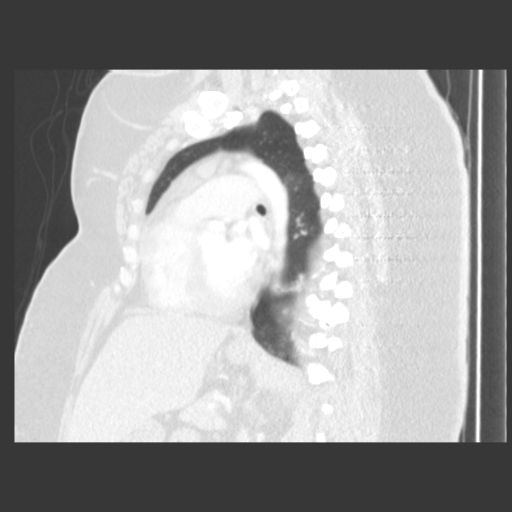
[im 117/146  lung]
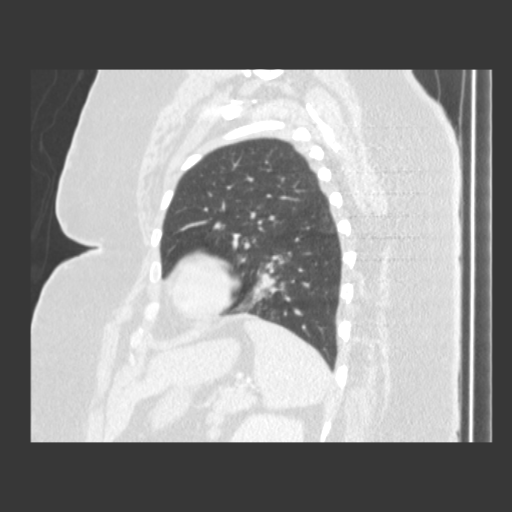

[Series 301: coronal chest · coronal · 0.86mm/px · 3 of 126 slices shown]
[im 32/126  lung]
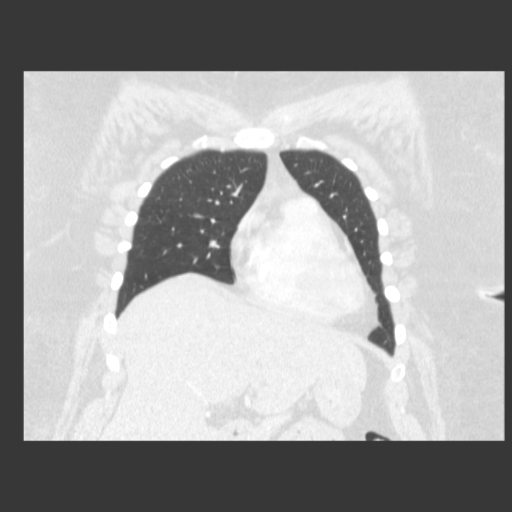
[im 63/126  lung]
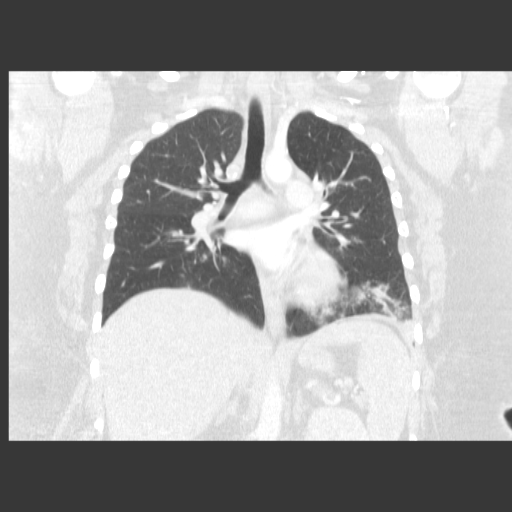
[im 94/126  lung]
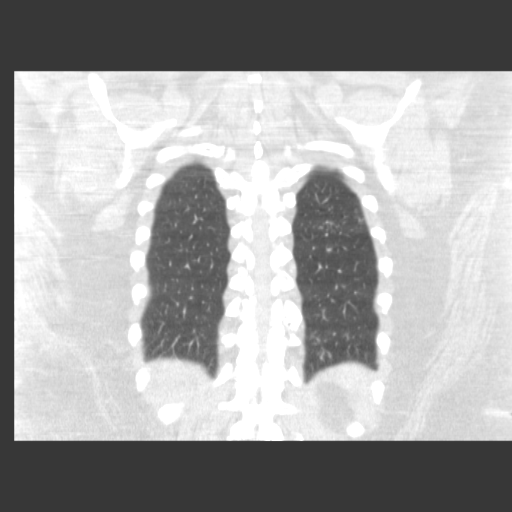

[17 of 30 positions shown; findings below may reference images not displayed]

FINDINGS: Study is slightly limited secondary to respiratory motion artifact, patient body habitus and suboptimal opacification of pulmonary arteries.
There are no definite filling defects in the central or large segmental pulmonary arteries.  Evaluation of the smaller segmental and subsegmental arteries is difficult due to limitations as described above.  No evidence of pleural or pericardial effusions are noted.  Air space opacities within the left lower lobe are compatible with pneumonia.  Interstitial tree and Lennin opacities within bilateral upper lobes, right lower lobe, and lingula are compatible with infection as well.  Mildly enlarged right hilar node and a 1.5cm prevascular node are most likely reactive.  No other enlarged lymph nodes are identified.  The heart and great vessels are within normal limits.  There is no evidence of thoracic aortic aneurysm or definite dissection.  A 2 x 3cm right adrenal adenoma is noted.  Hepatomegaly is present.
IMPRESSION: 1. Left lower lobe pneumonia and smaller scattered areas of interstitial infection bilaterally.  Mildly enlarged right hilar and mediastinal lymph nodes are likely reactive.
2. No definite evidence of pulmonary emboli, but sensitivity is decreased due to motion artifact, body habitus, and suboptimal contrast opacification.
3. 2 x 3cm right adrenal adenoma.
4. Hepatomegaly.

## 2008-02-20 ENCOUNTER — Emergency Department (HOSPITAL_COMMUNITY): Admission: EM | Admit: 2008-02-20 | Discharge: 2008-02-20 | Payer: Self-pay | Admitting: Family Medicine

## 2008-02-22 ENCOUNTER — Emergency Department (HOSPITAL_COMMUNITY): Admission: EM | Admit: 2008-02-22 | Discharge: 2008-02-22 | Payer: Self-pay | Admitting: Emergency Medicine

## 2008-05-12 ENCOUNTER — Emergency Department (HOSPITAL_COMMUNITY): Admission: EM | Admit: 2008-05-12 | Discharge: 2008-05-12 | Payer: Self-pay | Admitting: Emergency Medicine

## 2008-05-22 DIAGNOSIS — E678 Other specified hyperalimentation: Secondary | ICD-10-CM | POA: Insufficient documentation

## 2008-05-22 DIAGNOSIS — I10 Essential (primary) hypertension: Secondary | ICD-10-CM | POA: Insufficient documentation

## 2008-05-22 DIAGNOSIS — K219 Gastro-esophageal reflux disease without esophagitis: Secondary | ICD-10-CM | POA: Insufficient documentation

## 2008-05-22 DIAGNOSIS — E662 Morbid (severe) obesity with alveolar hypoventilation: Secondary | ICD-10-CM | POA: Insufficient documentation

## 2008-05-22 DIAGNOSIS — F39 Unspecified mood [affective] disorder: Secondary | ICD-10-CM | POA: Insufficient documentation

## 2008-05-22 DIAGNOSIS — F329 Major depressive disorder, single episode, unspecified: Secondary | ICD-10-CM | POA: Insufficient documentation

## 2008-05-22 DIAGNOSIS — E119 Type 2 diabetes mellitus without complications: Secondary | ICD-10-CM | POA: Insufficient documentation

## 2008-05-22 DIAGNOSIS — E1151 Type 2 diabetes mellitus with diabetic peripheral angiopathy without gangrene: Secondary | ICD-10-CM | POA: Insufficient documentation

## 2008-05-22 DIAGNOSIS — IMO0002 Reserved for concepts with insufficient information to code with codable children: Secondary | ICD-10-CM | POA: Insufficient documentation

## 2008-05-22 DIAGNOSIS — E1165 Type 2 diabetes mellitus with hyperglycemia: Secondary | ICD-10-CM

## 2008-05-22 DIAGNOSIS — E1140 Type 2 diabetes mellitus with diabetic neuropathy, unspecified: Secondary | ICD-10-CM | POA: Insufficient documentation

## 2008-05-22 DIAGNOSIS — J45909 Unspecified asthma, uncomplicated: Secondary | ICD-10-CM | POA: Insufficient documentation

## 2008-05-22 DIAGNOSIS — G4733 Obstructive sleep apnea (adult) (pediatric): Secondary | ICD-10-CM | POA: Insufficient documentation

## 2008-05-22 DIAGNOSIS — E1149 Type 2 diabetes mellitus with other diabetic neurological complication: Secondary | ICD-10-CM | POA: Insufficient documentation

## 2008-06-25 ENCOUNTER — Ambulatory Visit: Payer: Self-pay | Admitting: Family Medicine

## 2008-07-02 ENCOUNTER — Emergency Department (HOSPITAL_COMMUNITY): Admission: EM | Admit: 2008-07-02 | Discharge: 2008-07-02 | Payer: Self-pay | Admitting: Emergency Medicine

## 2008-07-25 ENCOUNTER — Inpatient Hospital Stay (HOSPITAL_COMMUNITY): Admission: AD | Admit: 2008-07-25 | Discharge: 2008-07-25 | Payer: Self-pay | Admitting: Obstetrics and Gynecology

## 2008-07-31 ENCOUNTER — Emergency Department (HOSPITAL_COMMUNITY): Admission: EM | Admit: 2008-07-31 | Discharge: 2008-07-31 | Payer: Self-pay | Admitting: Emergency Medicine

## 2008-08-04 ENCOUNTER — Emergency Department (HOSPITAL_COMMUNITY): Admission: EM | Admit: 2008-08-04 | Discharge: 2008-08-05 | Payer: Self-pay | Admitting: Emergency Medicine

## 2008-09-26 ENCOUNTER — Emergency Department (HOSPITAL_COMMUNITY): Admission: EM | Admit: 2008-09-26 | Discharge: 2008-09-26 | Payer: Self-pay | Admitting: Emergency Medicine

## 2008-10-11 IMAGING — CR DG CHEST 2V
2 series · 2 of 2 positions shown · non-contrast
Comparison: Previous examinations.

CLINICAL DATA: Chest pain and shortness of breath. Diabetes.

CHEST - 2 VIEW

[w chest pa *]
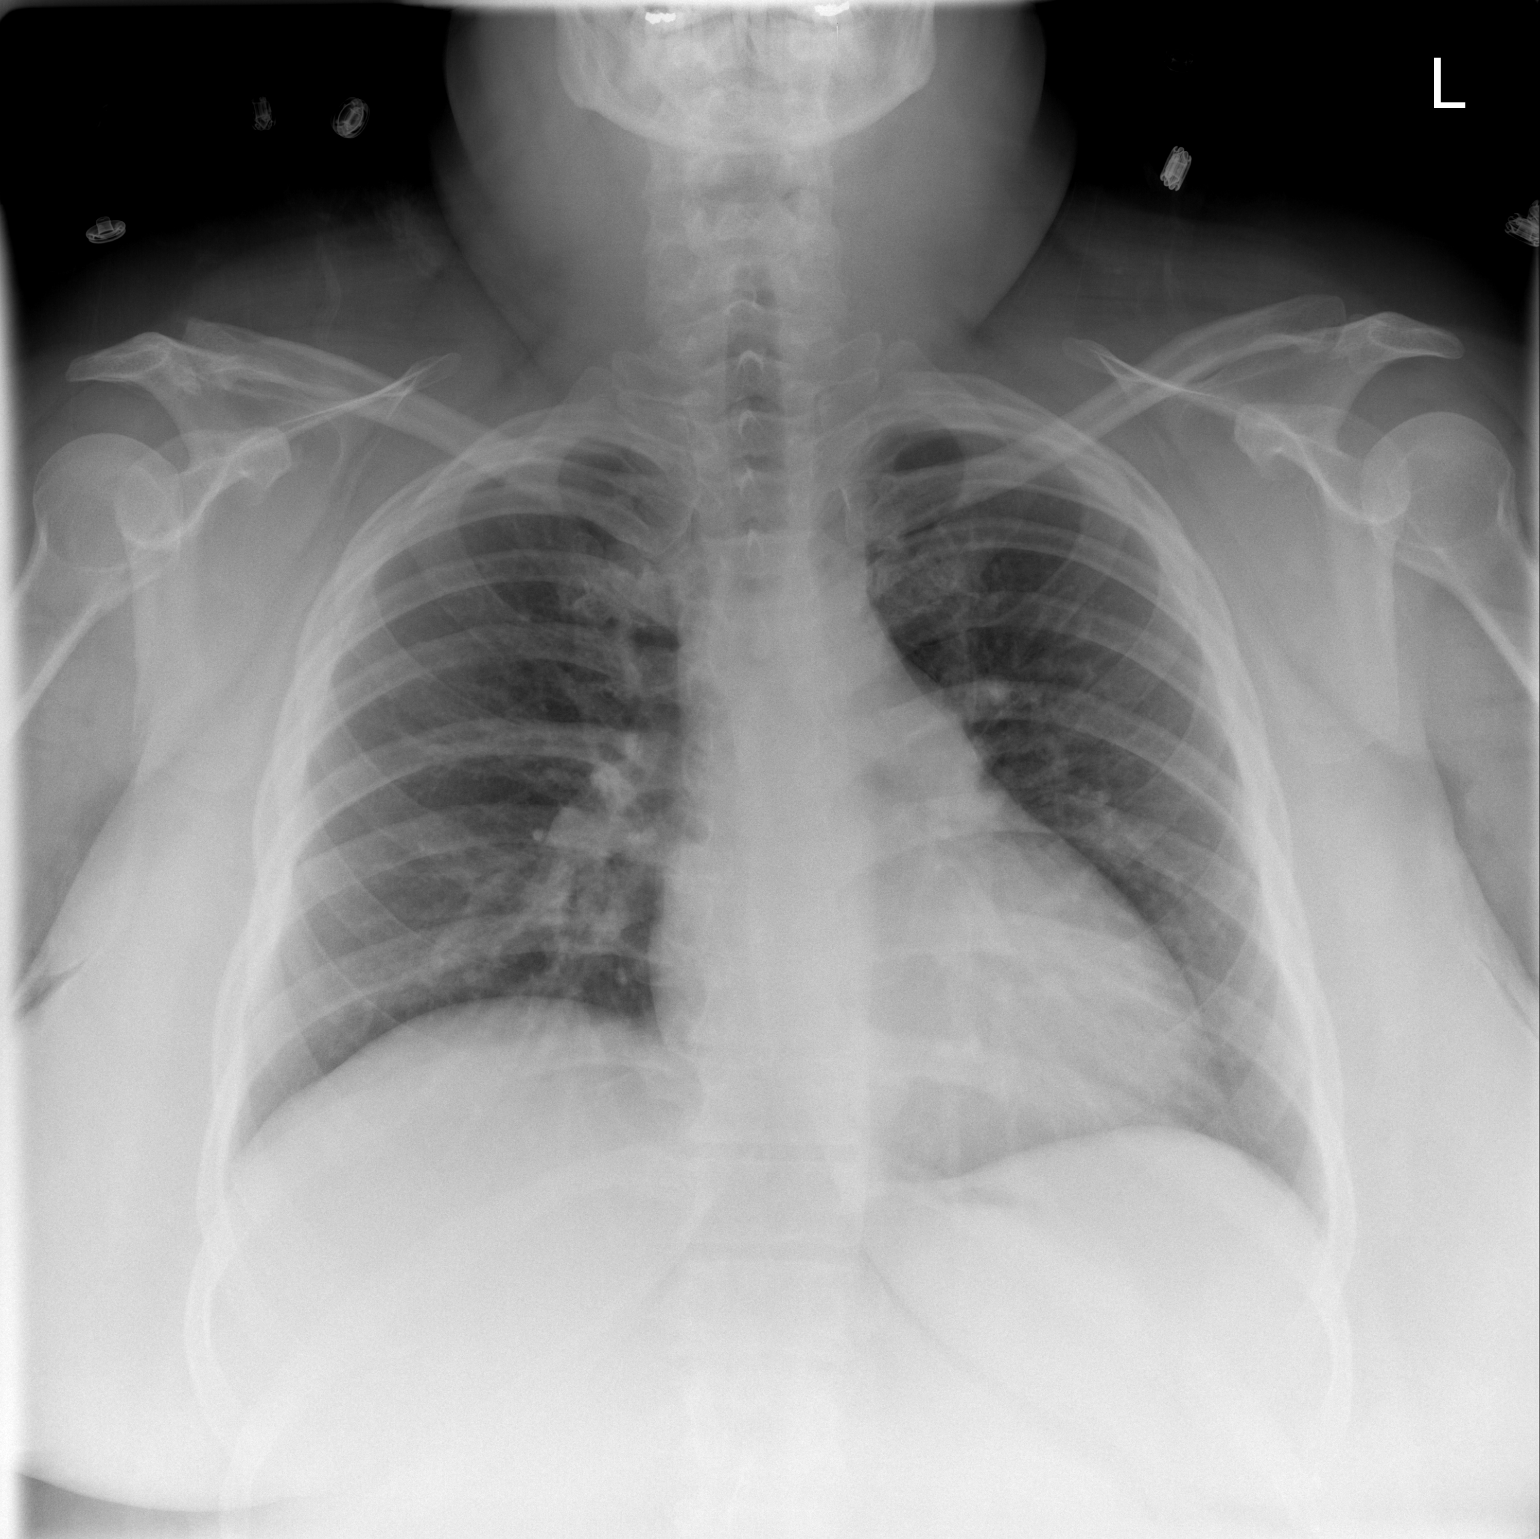

[w chest lat *]
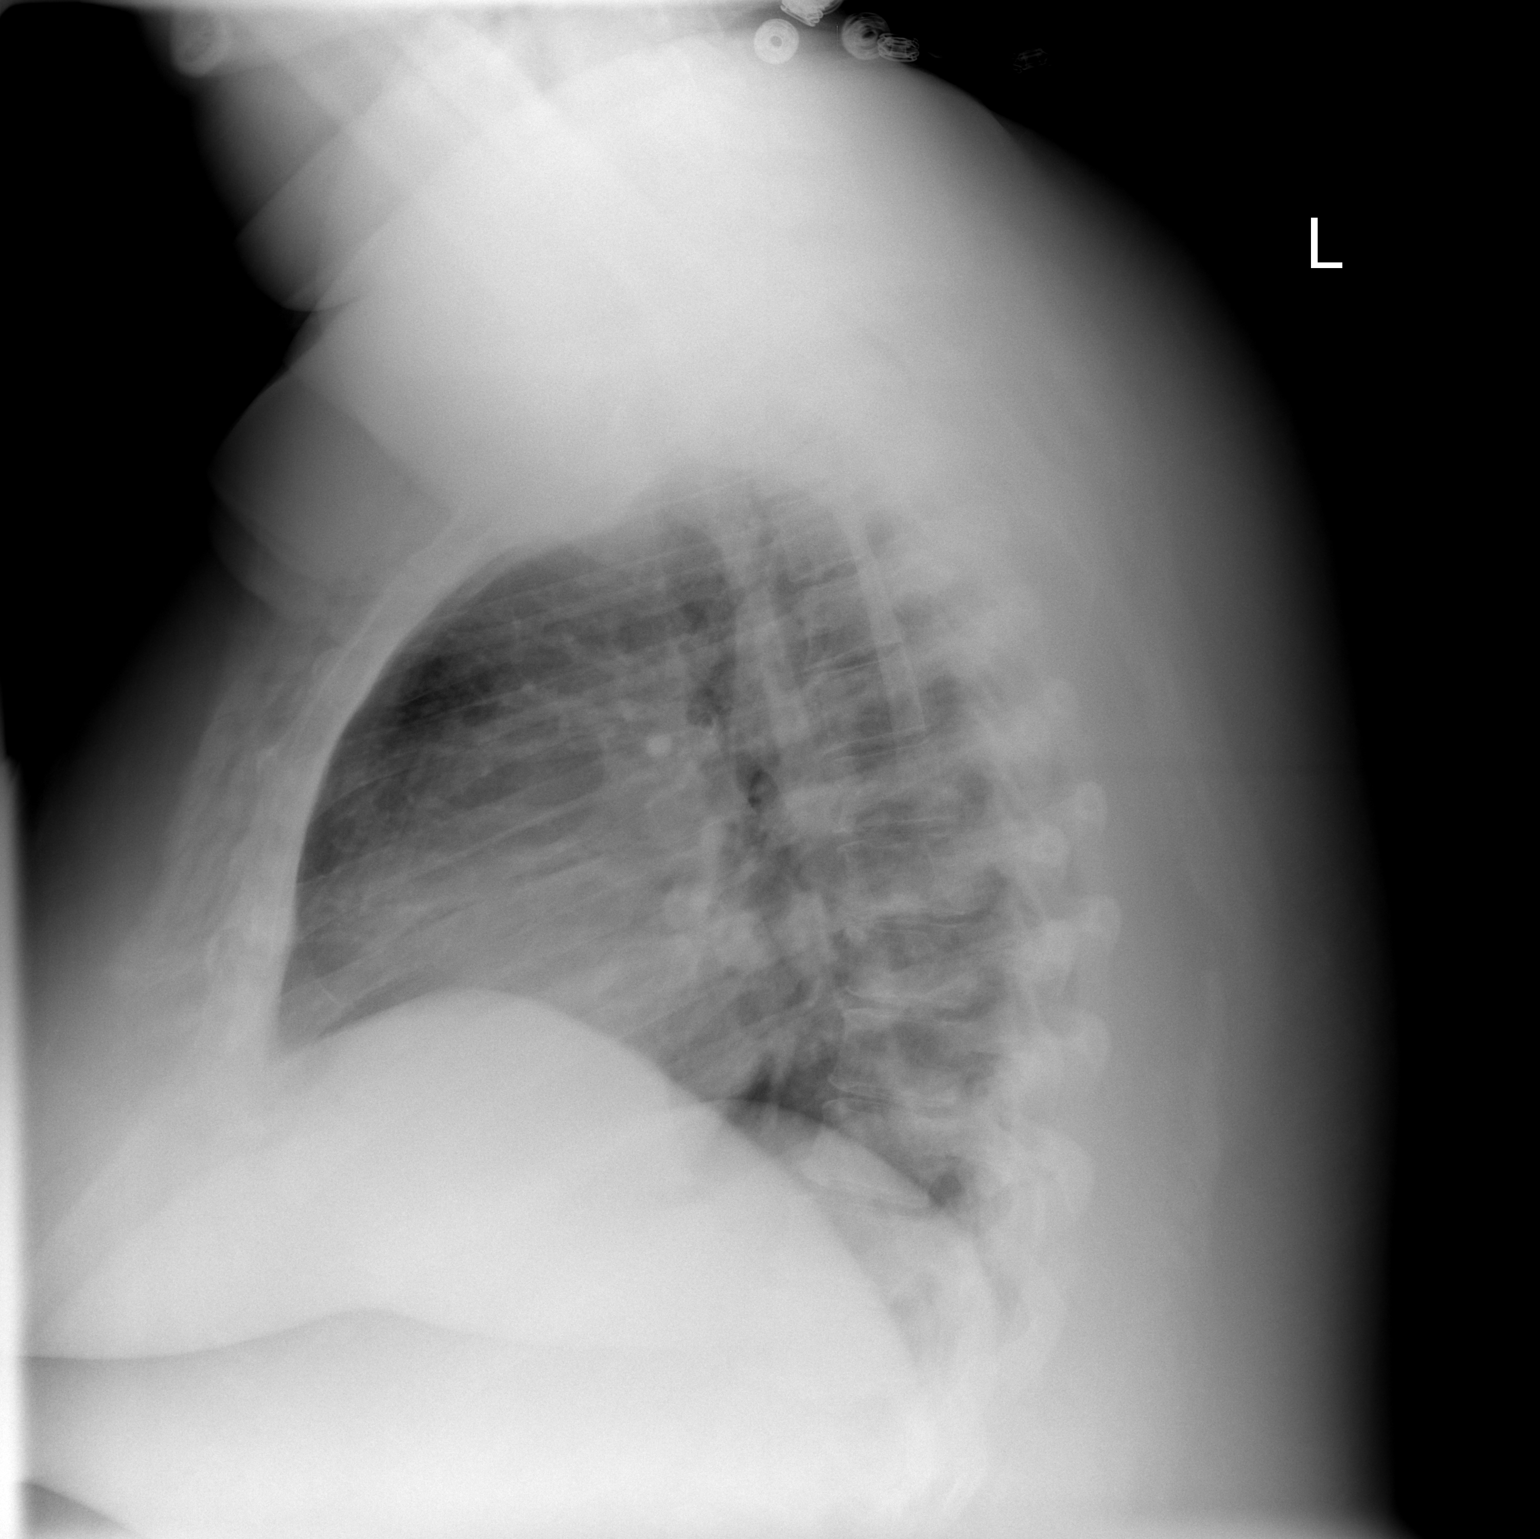

[2 of 2 positions shown; findings below may reference images not displayed]

FINDINGS: Normal sized heart. Clear lungs. Minimal thoracic spine degenerative
changes.

IMPRESSION

No acute abnormality.

## 2008-10-13 IMAGING — CR DG WRIST COMPLETE 3+V*R*
2 series · 2 of 2 positions shown · non-contrast
Comparison: none

CLINICAL DATA: Fall, wrist pain

RIGHT WRIST - 4 VIEW

[view not recorded (1 of 2)]
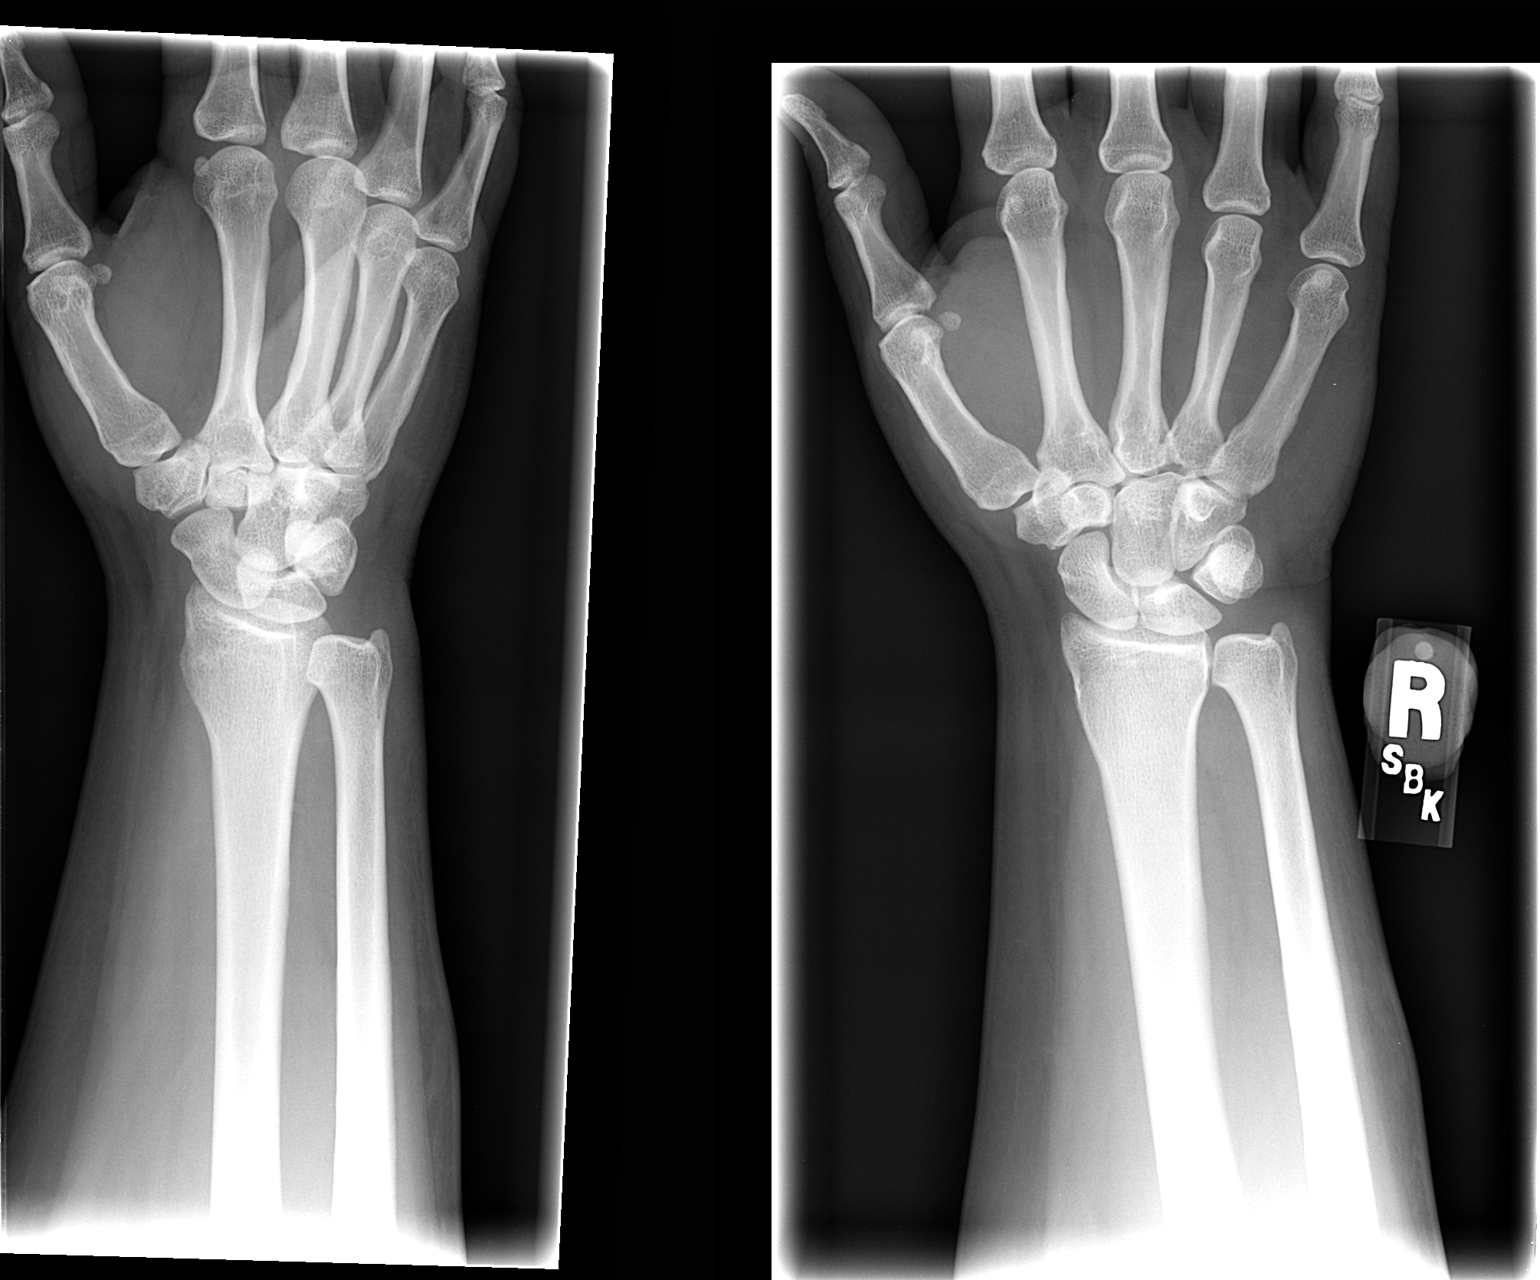

[view not recorded (2 of 2)]
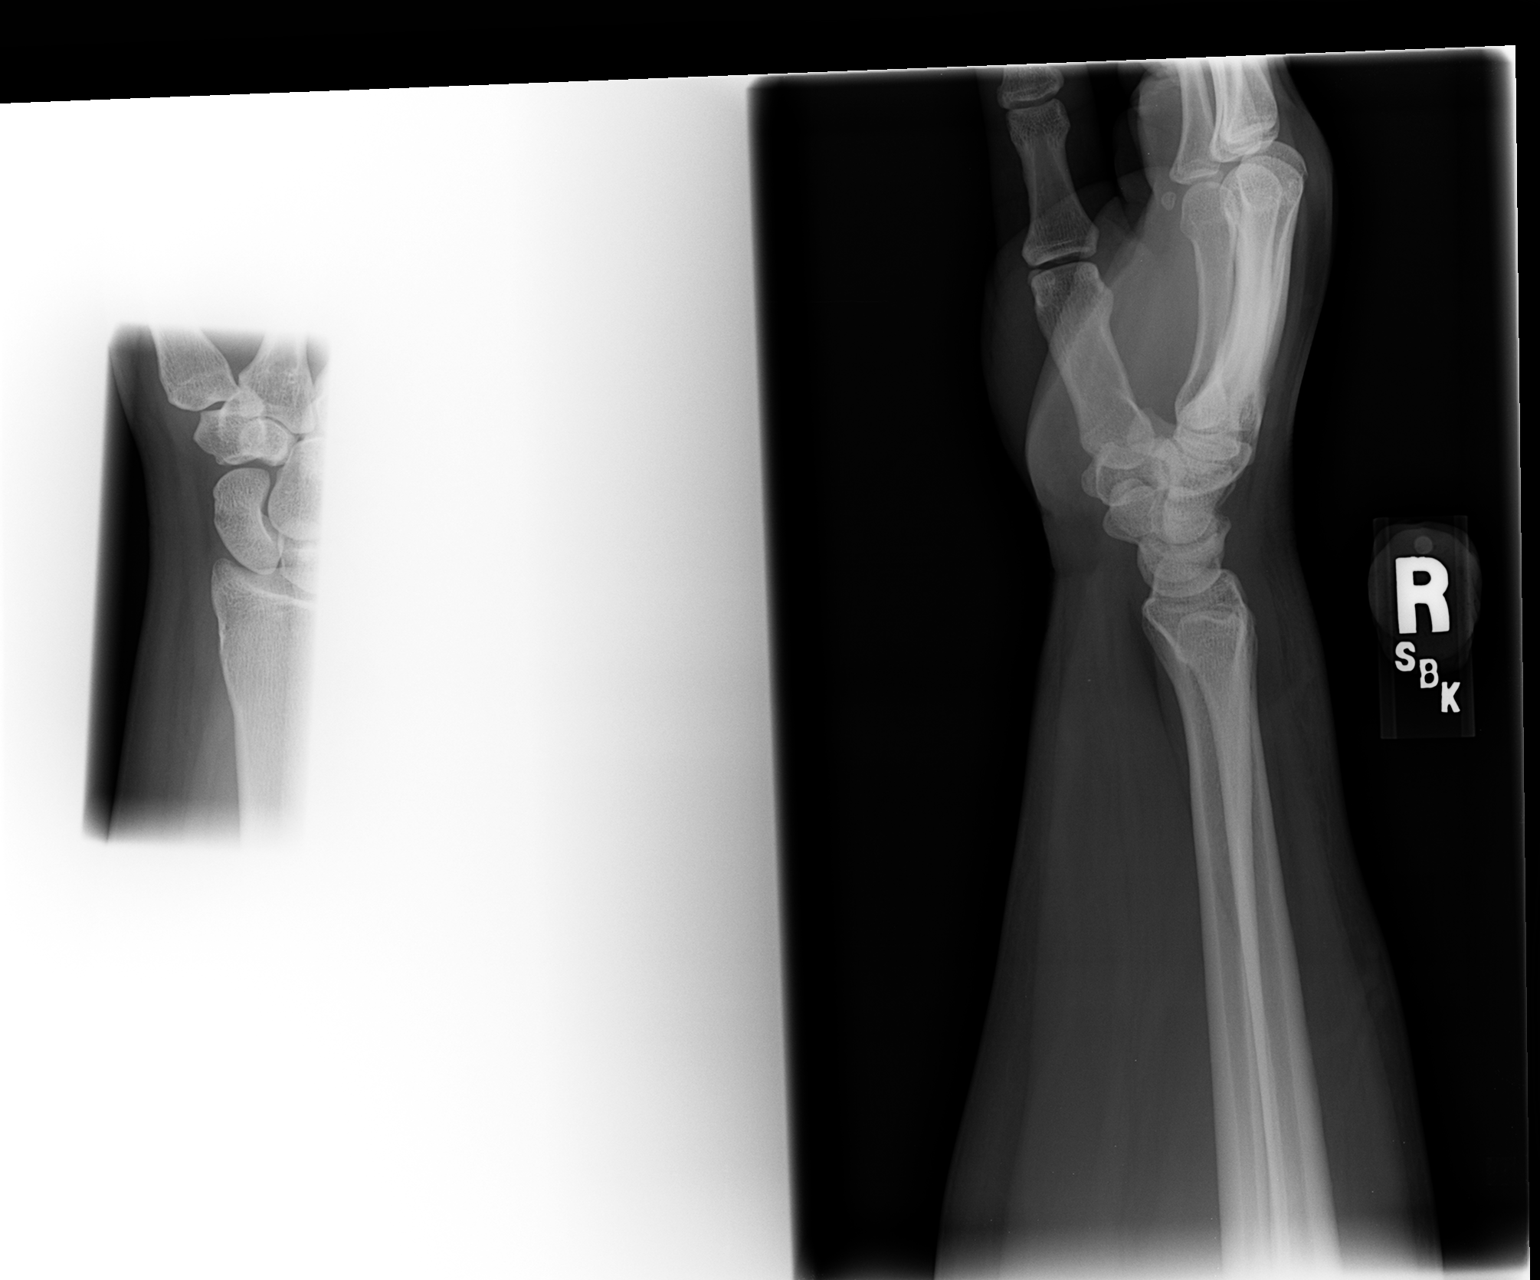

[2 of 2 positions shown; findings below may reference images not displayed]

FINDINGS: No acute bony abnormality. Specifically, no fracture, subluxation, or
dislocation. Soft tissues are intact.

IMPRESSION

No acute findings

## 2008-10-13 IMAGING — CR DG KNEE COMPLETE 4+V*L*
5 series · 5 of 5 positions shown · non-contrast
Comparison: none

CLINICAL DATA: Fall, left knee pain

LEFT KNEE - 4 VIEW

[view not recorded (1 of 5)]
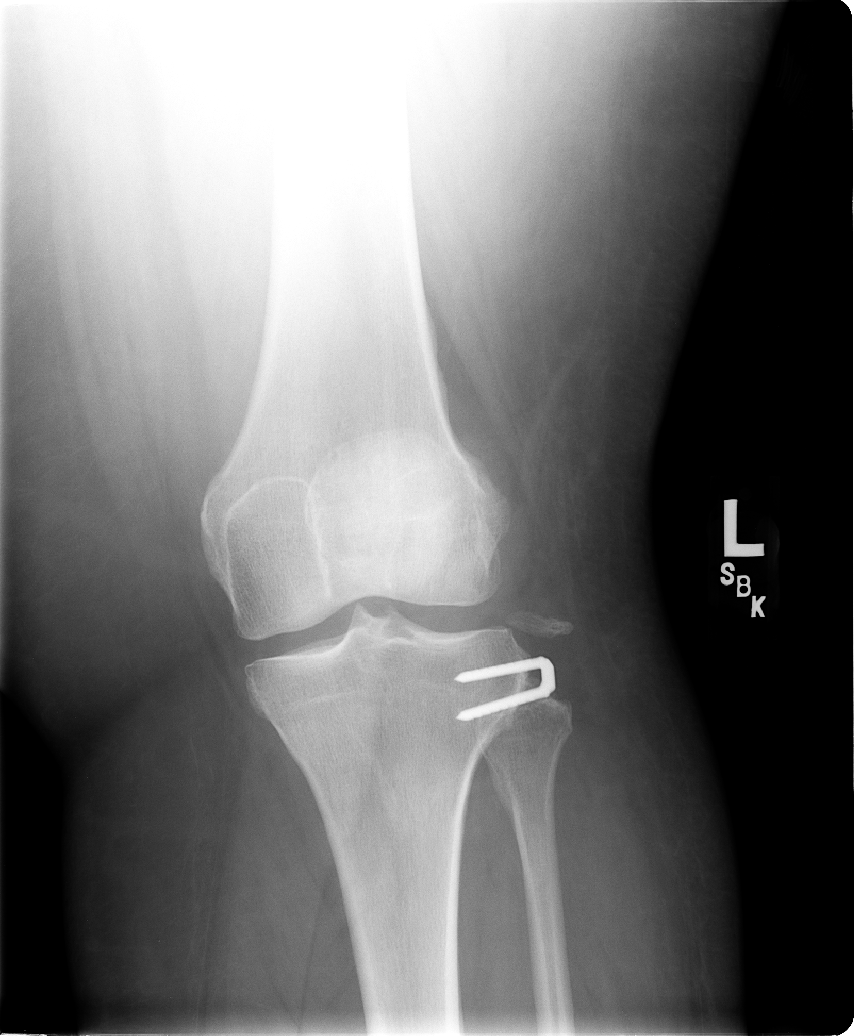

[view not recorded (2 of 5)]
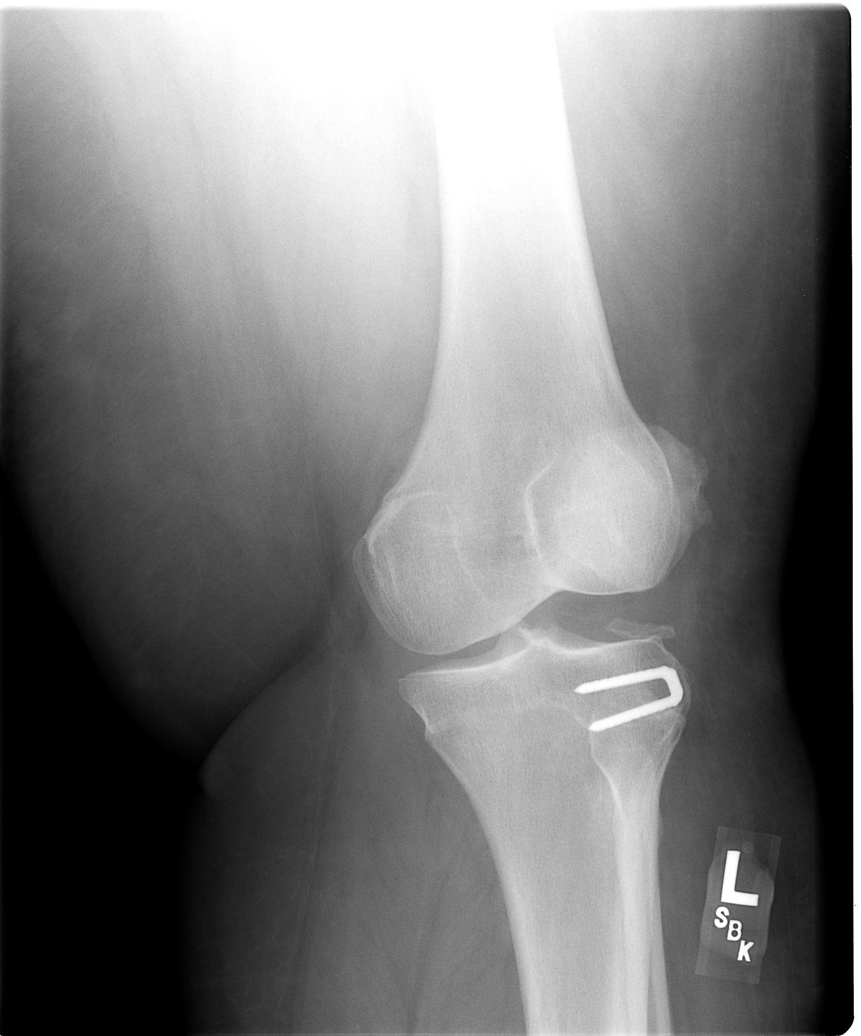

[view not recorded (3 of 5)]
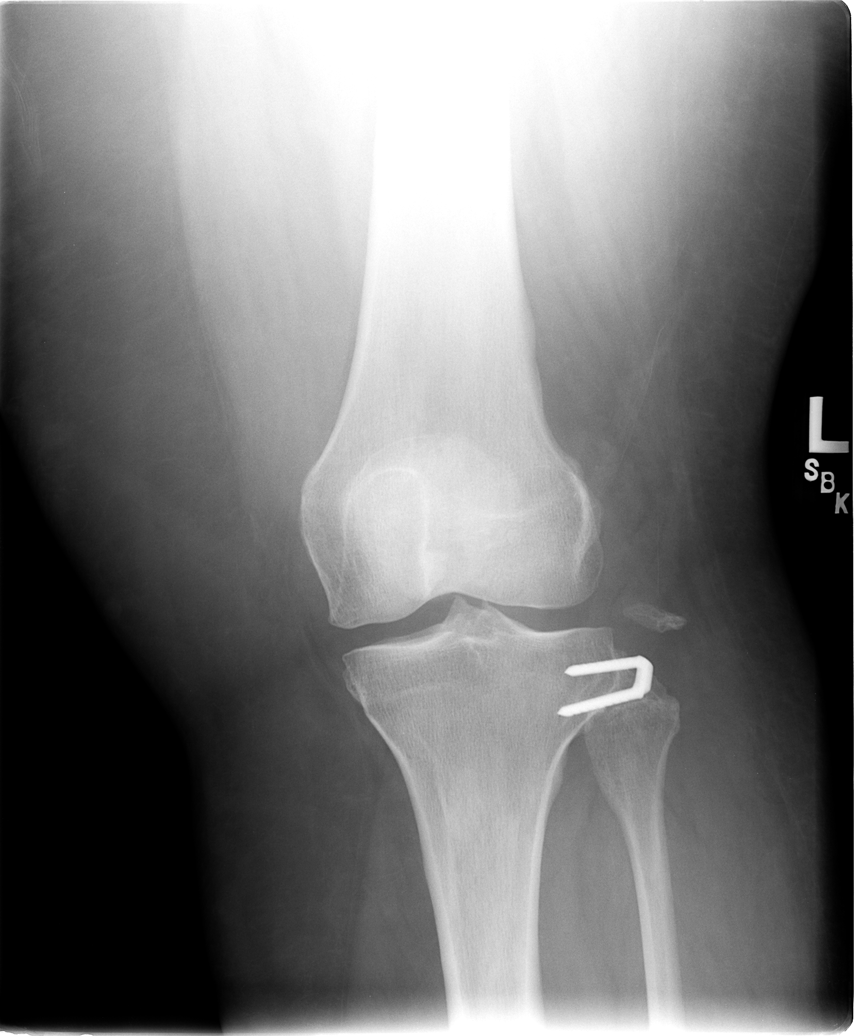

[view not recorded (4 of 5)]
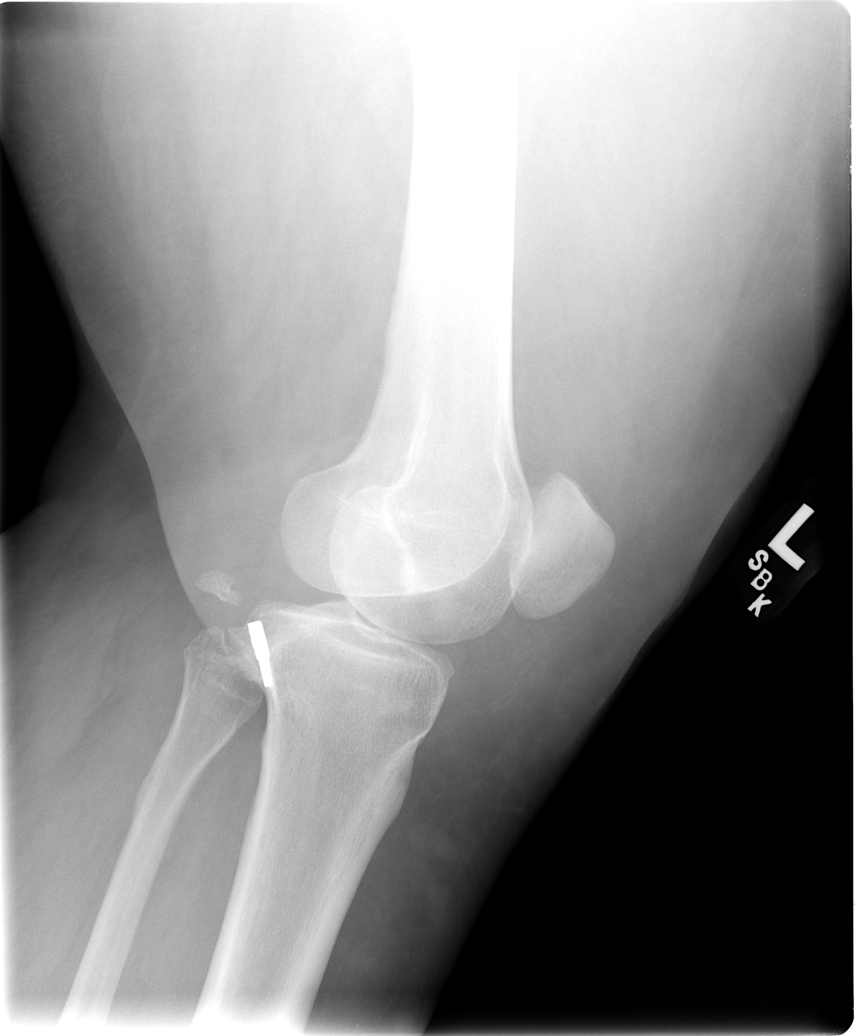

[view not recorded (5 of 5)]
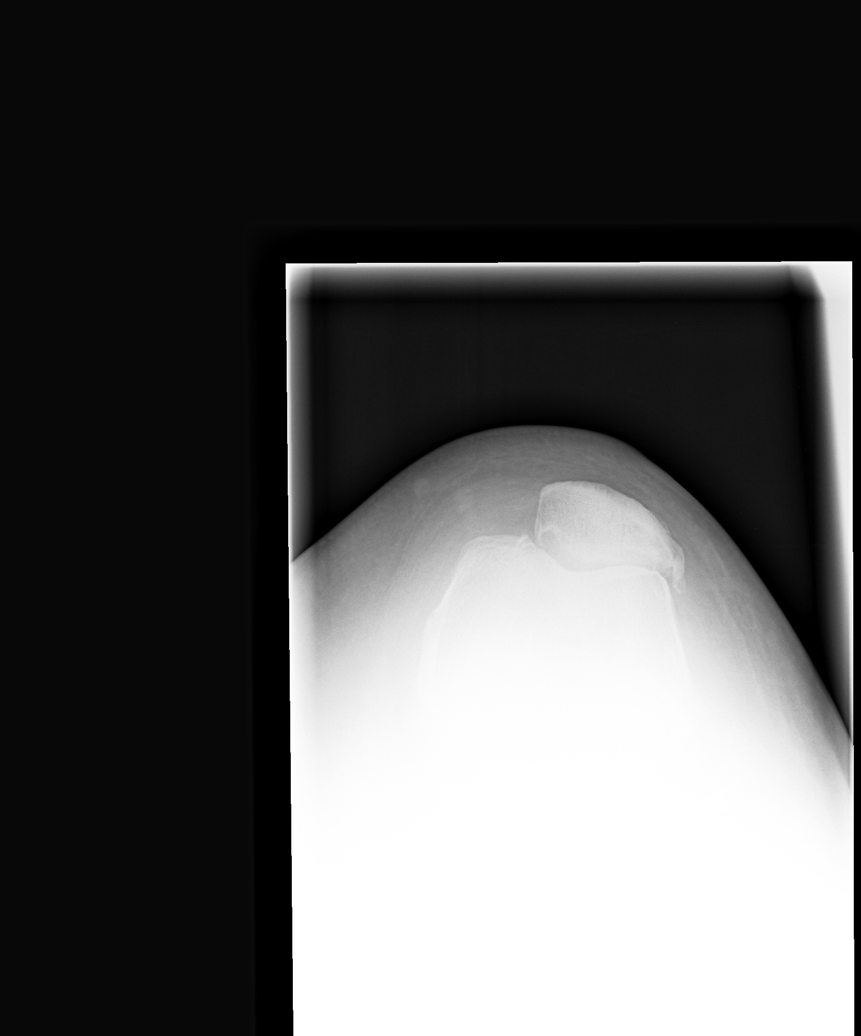

[5 of 5 positions shown; findings below may reference images not displayed]

FINDINGS: Comparison 07/05/2006. Postoperative changes are noted in the region
of the lateral tibial plateau. Mild degenerative changes in the left knee. No
acute bony abnormality. Specifically, no acute fracture, subluxation, or
dislocation.

IMPRESSION

Postoperative changes and mild degenerative changes stable.

No acute findings.

## 2009-01-01 ENCOUNTER — Emergency Department (HOSPITAL_COMMUNITY): Admission: EM | Admit: 2009-01-01 | Discharge: 2009-01-01 | Payer: Self-pay | Admitting: Emergency Medicine

## 2009-01-25 ENCOUNTER — Emergency Department (HOSPITAL_COMMUNITY): Admission: EM | Admit: 2009-01-25 | Discharge: 2009-01-26 | Payer: Self-pay | Admitting: Emergency Medicine

## 2009-04-01 ENCOUNTER — Emergency Department (HOSPITAL_COMMUNITY): Admission: EM | Admit: 2009-04-01 | Discharge: 2009-04-01 | Payer: Self-pay | Admitting: Emergency Medicine

## 2009-05-06 ENCOUNTER — Emergency Department (HOSPITAL_COMMUNITY): Admission: EM | Admit: 2009-05-06 | Discharge: 2009-05-06 | Payer: Self-pay | Admitting: Family Medicine

## 2009-07-28 ENCOUNTER — Emergency Department (HOSPITAL_COMMUNITY): Admission: EM | Admit: 2009-07-28 | Discharge: 2009-07-28 | Payer: Self-pay | Admitting: Emergency Medicine

## 2009-08-27 ENCOUNTER — Emergency Department (HOSPITAL_COMMUNITY): Admission: EM | Admit: 2009-08-27 | Discharge: 2009-08-27 | Payer: Self-pay | Admitting: Emergency Medicine

## 2009-09-13 IMAGING — CR DG CHEST 2V
2 series · 2 of 2 positions shown · non-contrast
Comparison: 09/02/2007

CLINICAL DATA: Chest pain

CHEST - 2 VIEW

[w chest pa *]
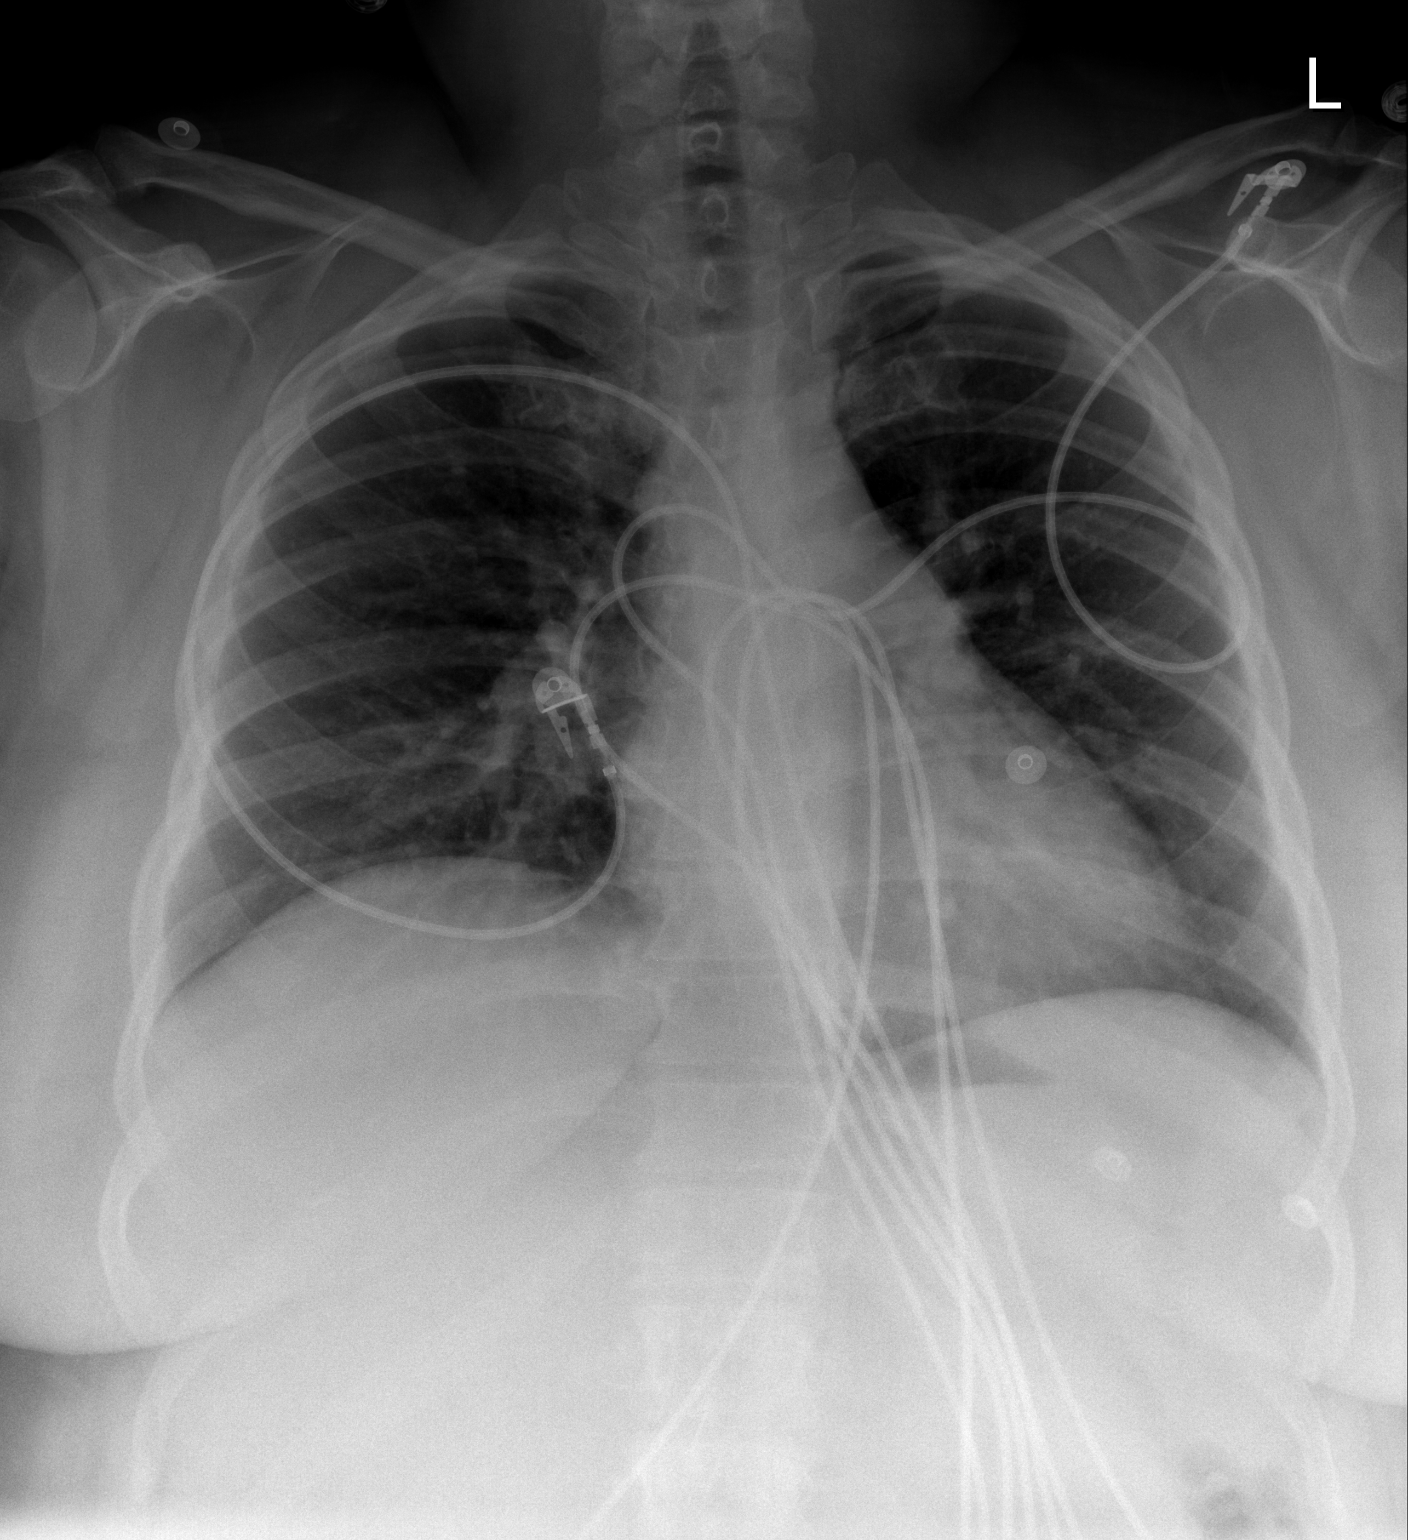

[w chest lat *]
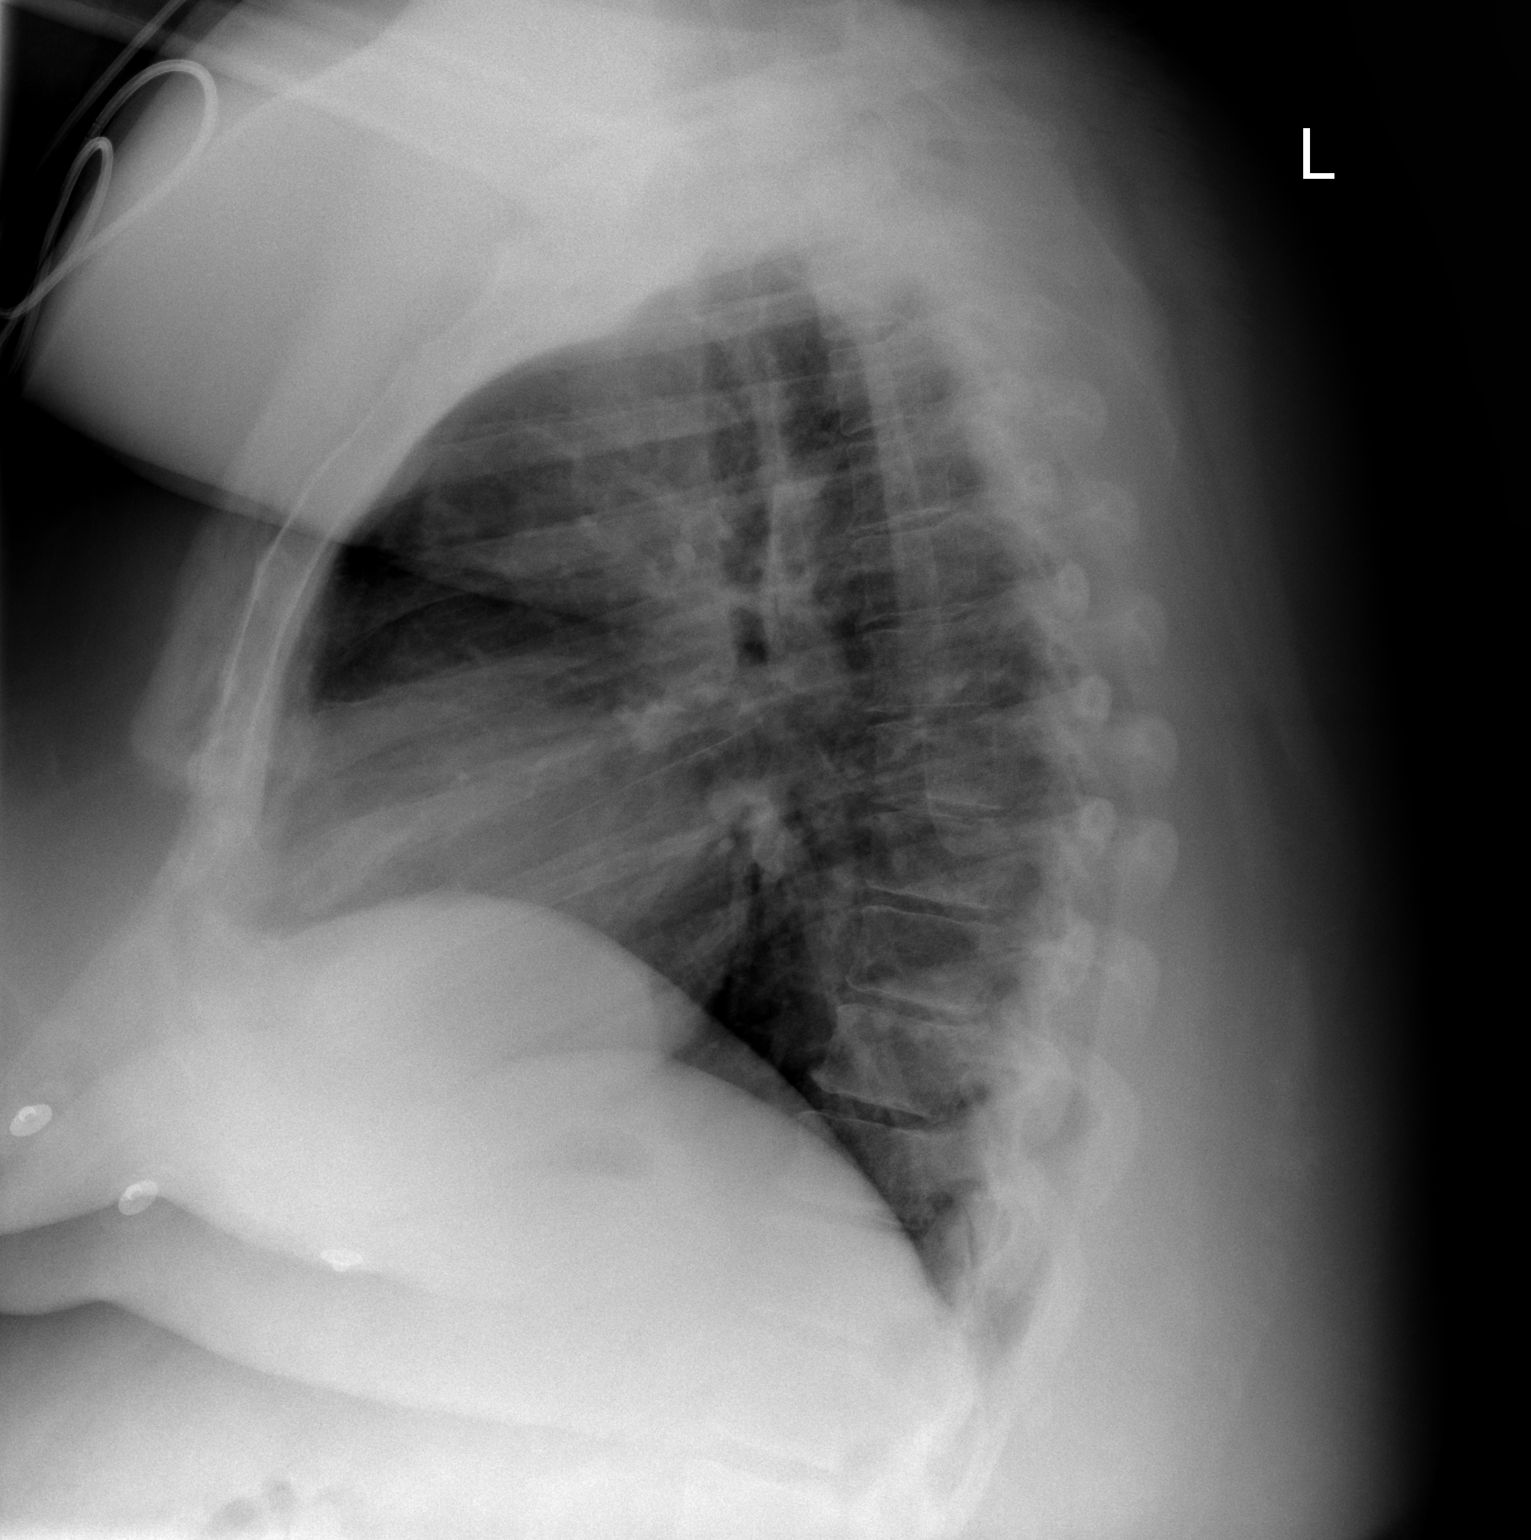

[2 of 2 positions shown; findings below may reference images not displayed]

FINDINGS: The lungs are clear.  Heart is normal.  The mediastinum
and hilar are negative for adenopathy.  Mid thoracic spine
degenerative changes noted
IMPRESSION: Negative for acute cardiopulmonary process.

## 2010-02-10 IMAGING — CR DG KNEE 4+V BILAT
4 series · 4 of 4 positions shown · non-contrast
Comparison: Left knee films 09/04/2007

CLINICAL DATA: Status post fall.  Bilateral knee pain anteriorly.

BILATERAL KNEE - 4+ VIEW

[t knee ap left]
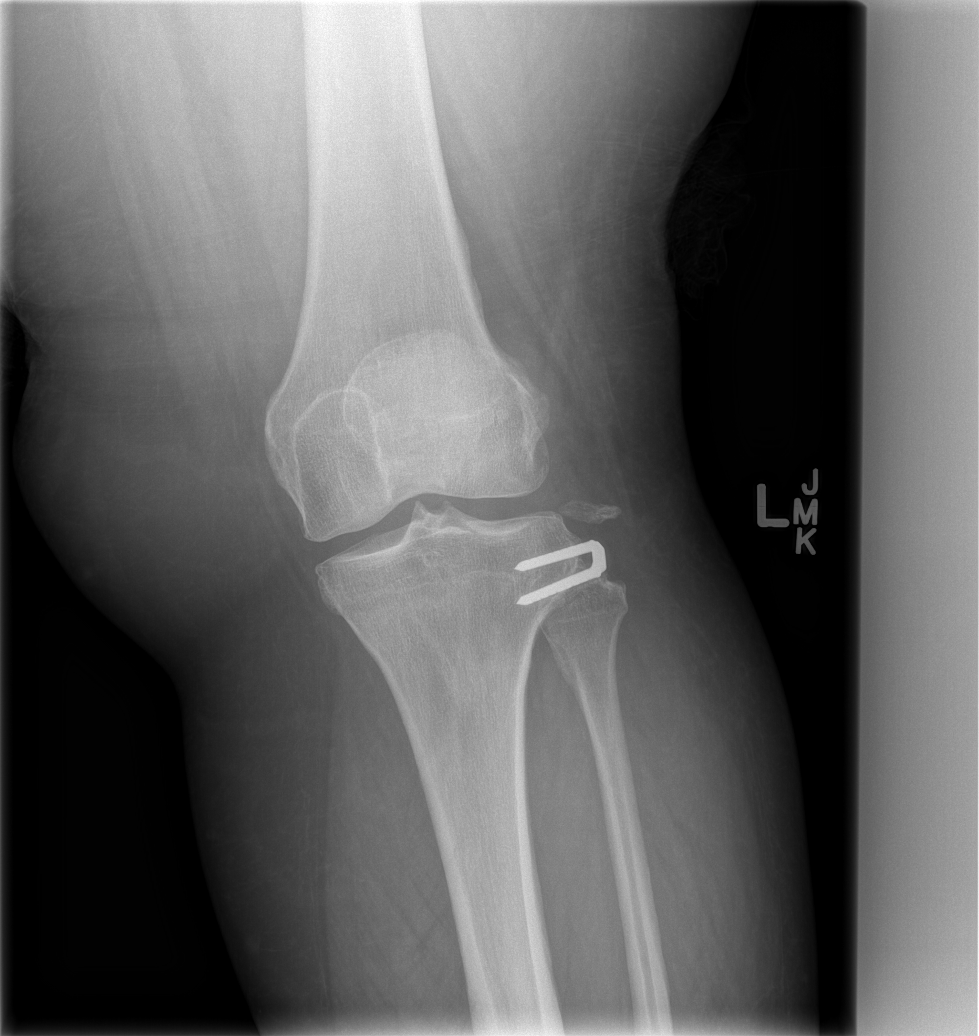

[t knee oblique left (1 of 2)]
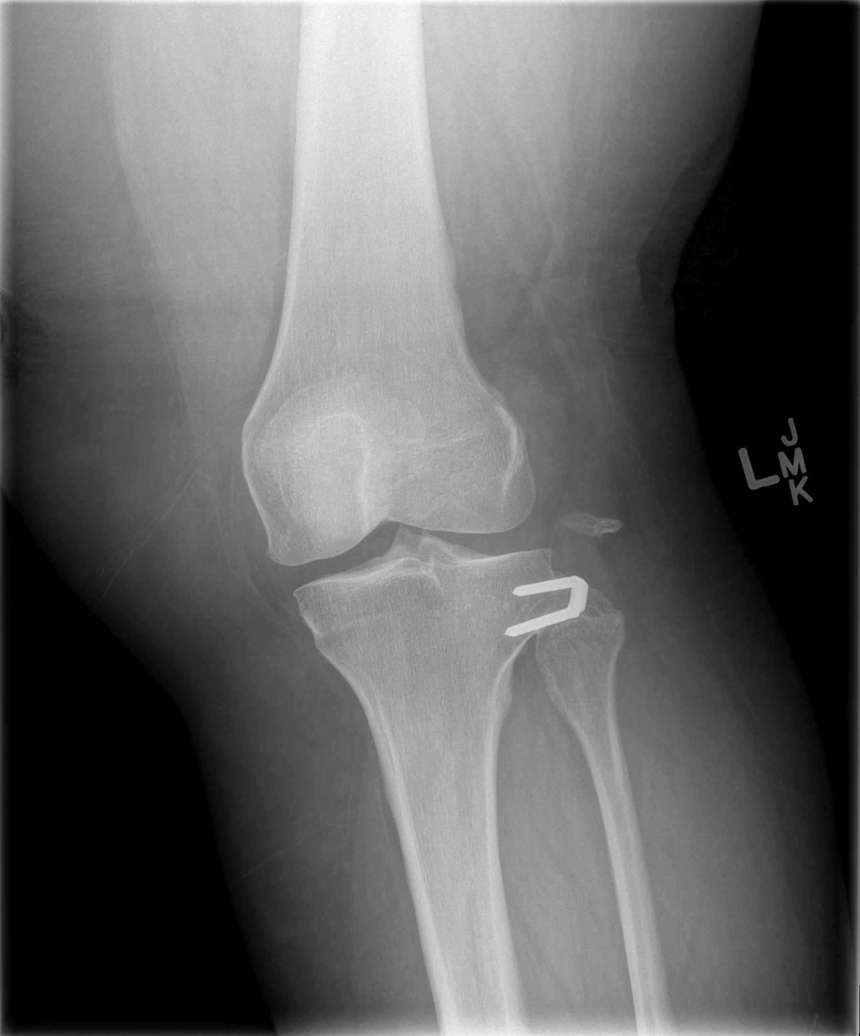

[t knee oblique left (2 of 2)]
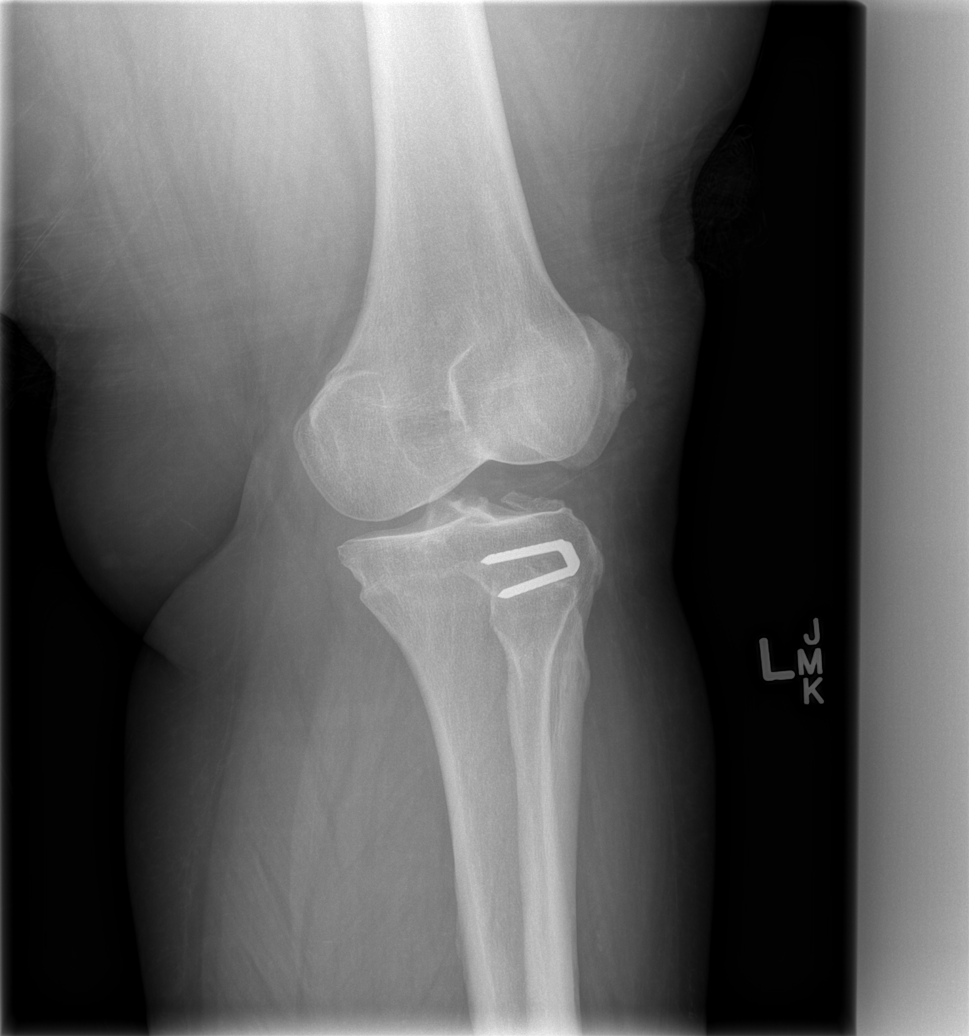

[t knee lat left]
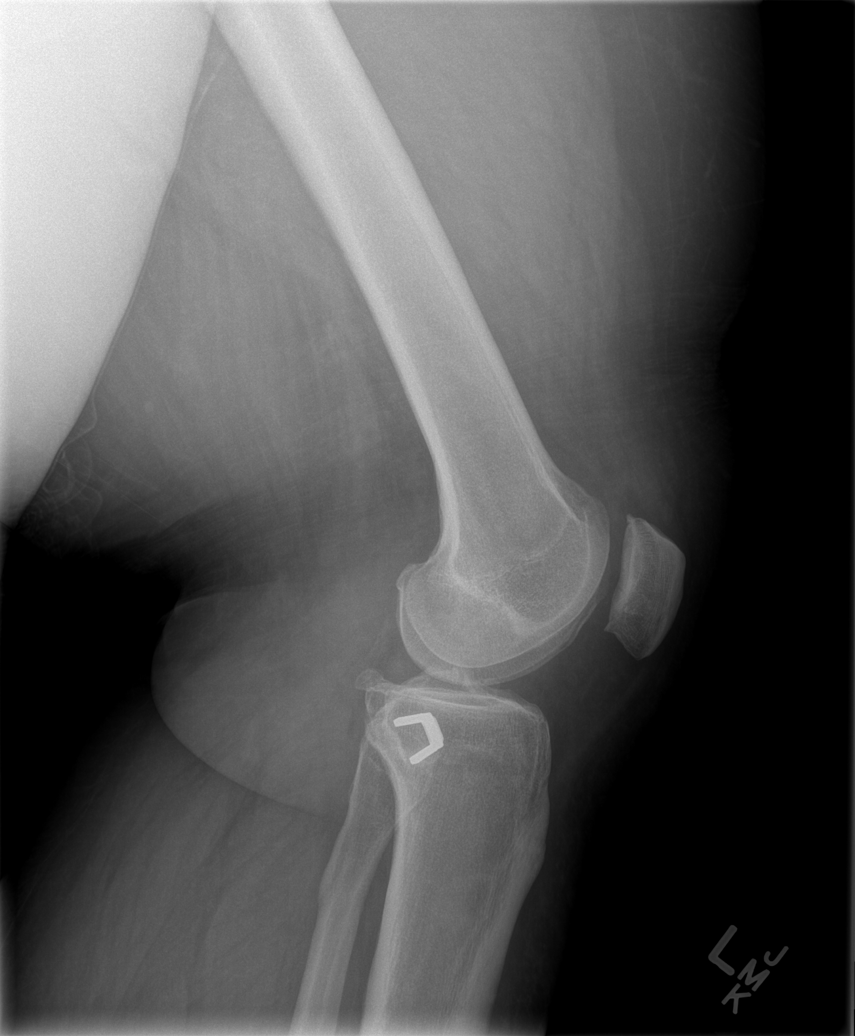

[4 of 4 positions shown; findings below may reference images not displayed]

FINDINGS: There are premature degenerative changes within the
patellofemoral and lateral greater than medial compartments of the
right knee.  There is no acute abnormality.  There is soft tissue
swelling over the tibial tuberosity.  Calcification within the
anterior soft tissues is most compatible myositis ossificans.

Four views of the left knee demonstrate previous stapling along the
lateral aspect likely related to the lateral collateral ligament
injury.  Mild degenerative changes are noted the patellofemoral and
medial compartments.  No acute abnormality is seen.
IMPRESSION: 1.  Age advanced degenerative changes in both knees.
2.  Postoperative changes of the left knee.
3.  Calcification anterior to the tibia on the right likely
reflects myositis ossificans.
4.  Soft tissue swelling anterior to the tibial tuberosity on the
right may be acute.

## 2010-02-10 IMAGING — CR DG FOOT COMPLETE 3+V*R*
3 series · 3 of 3 positions shown · non-contrast
Comparison: None available.

CLINICAL DATA: Status post fall.  Right lateral foot pain.

RIGHT FOOT COMPLETE - 3+ VIEW

[t foot ap right]
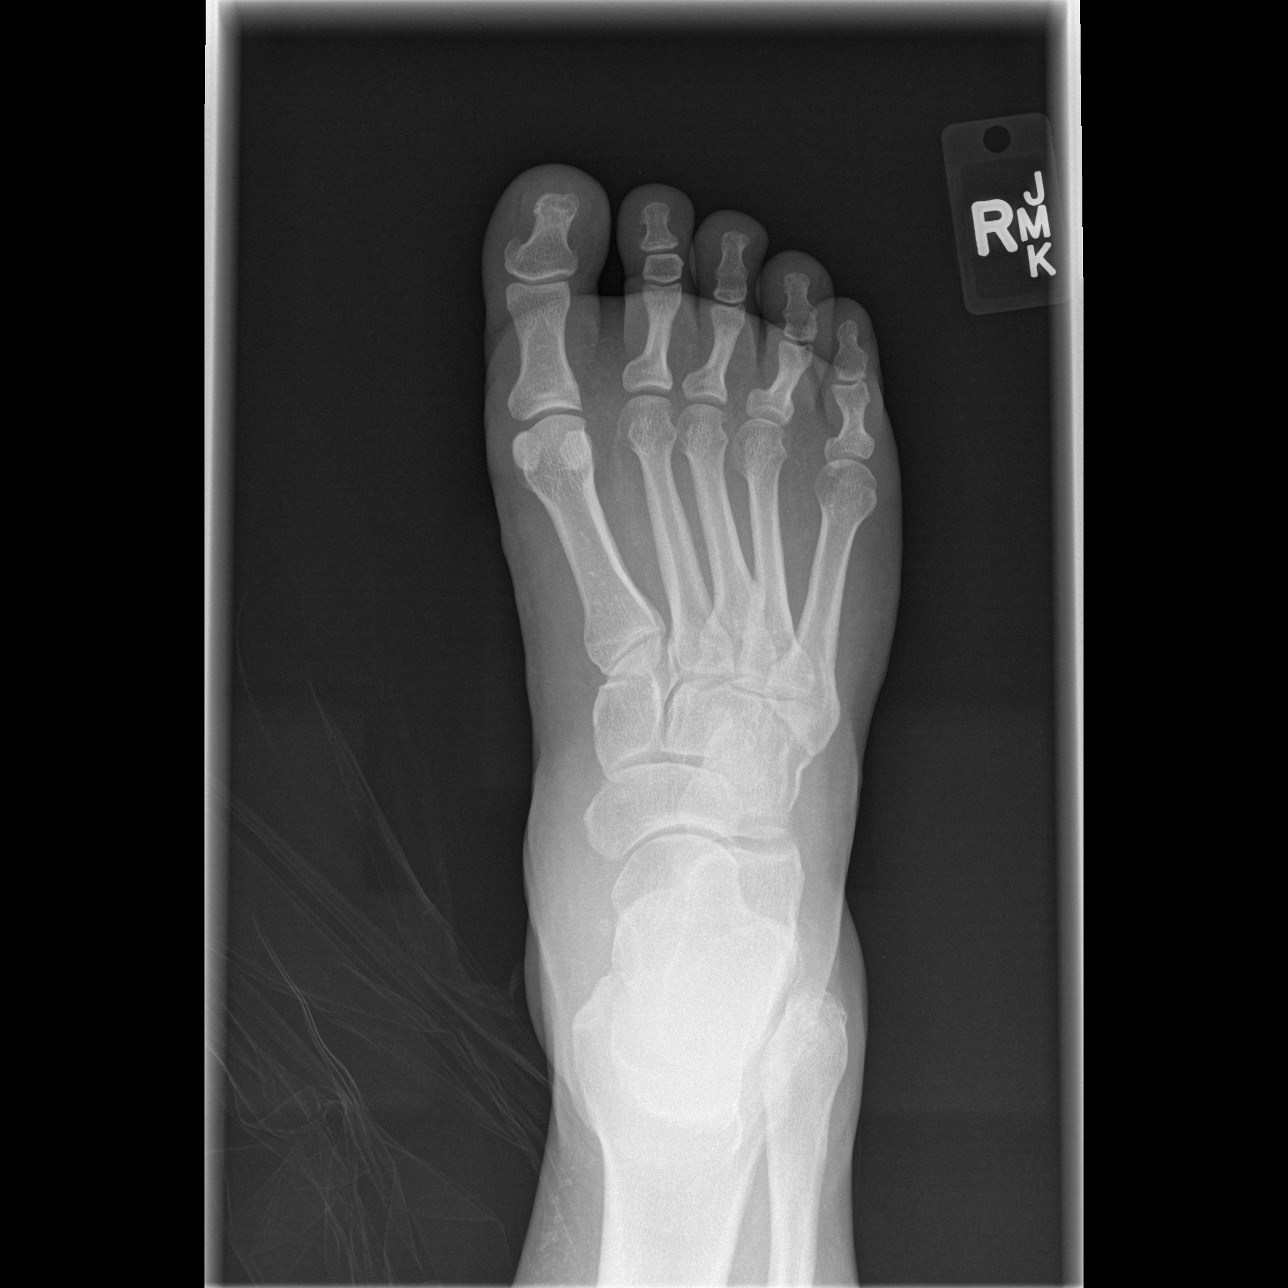

[t foot oblique right]
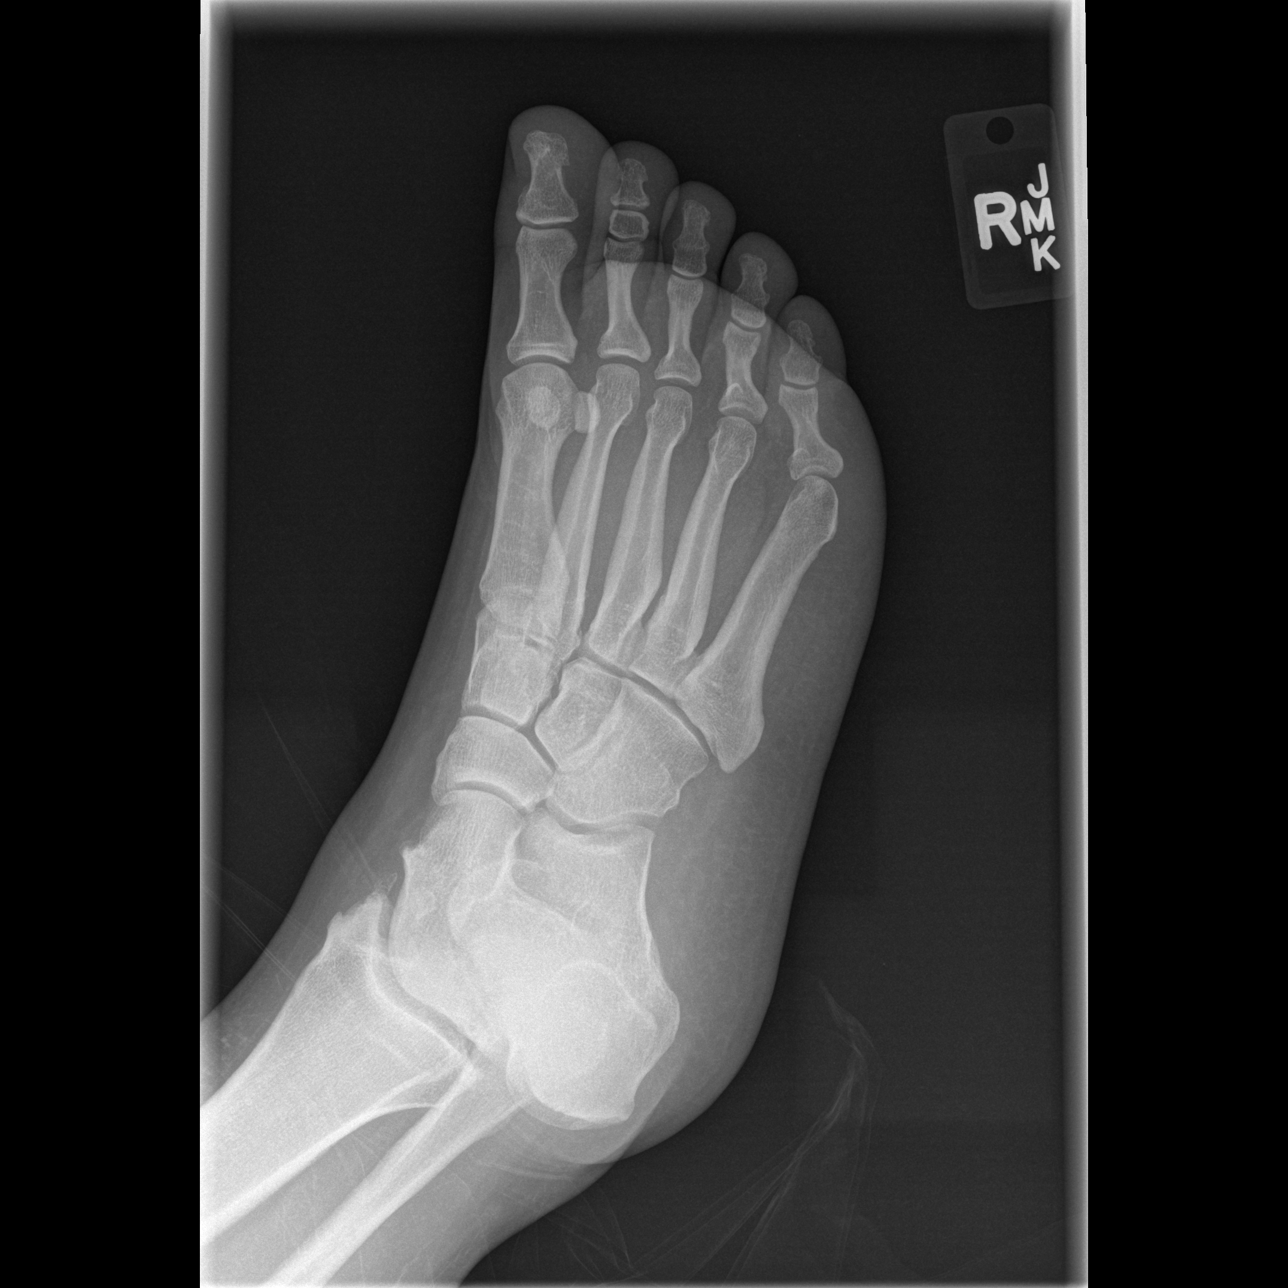

[t foot lat right]
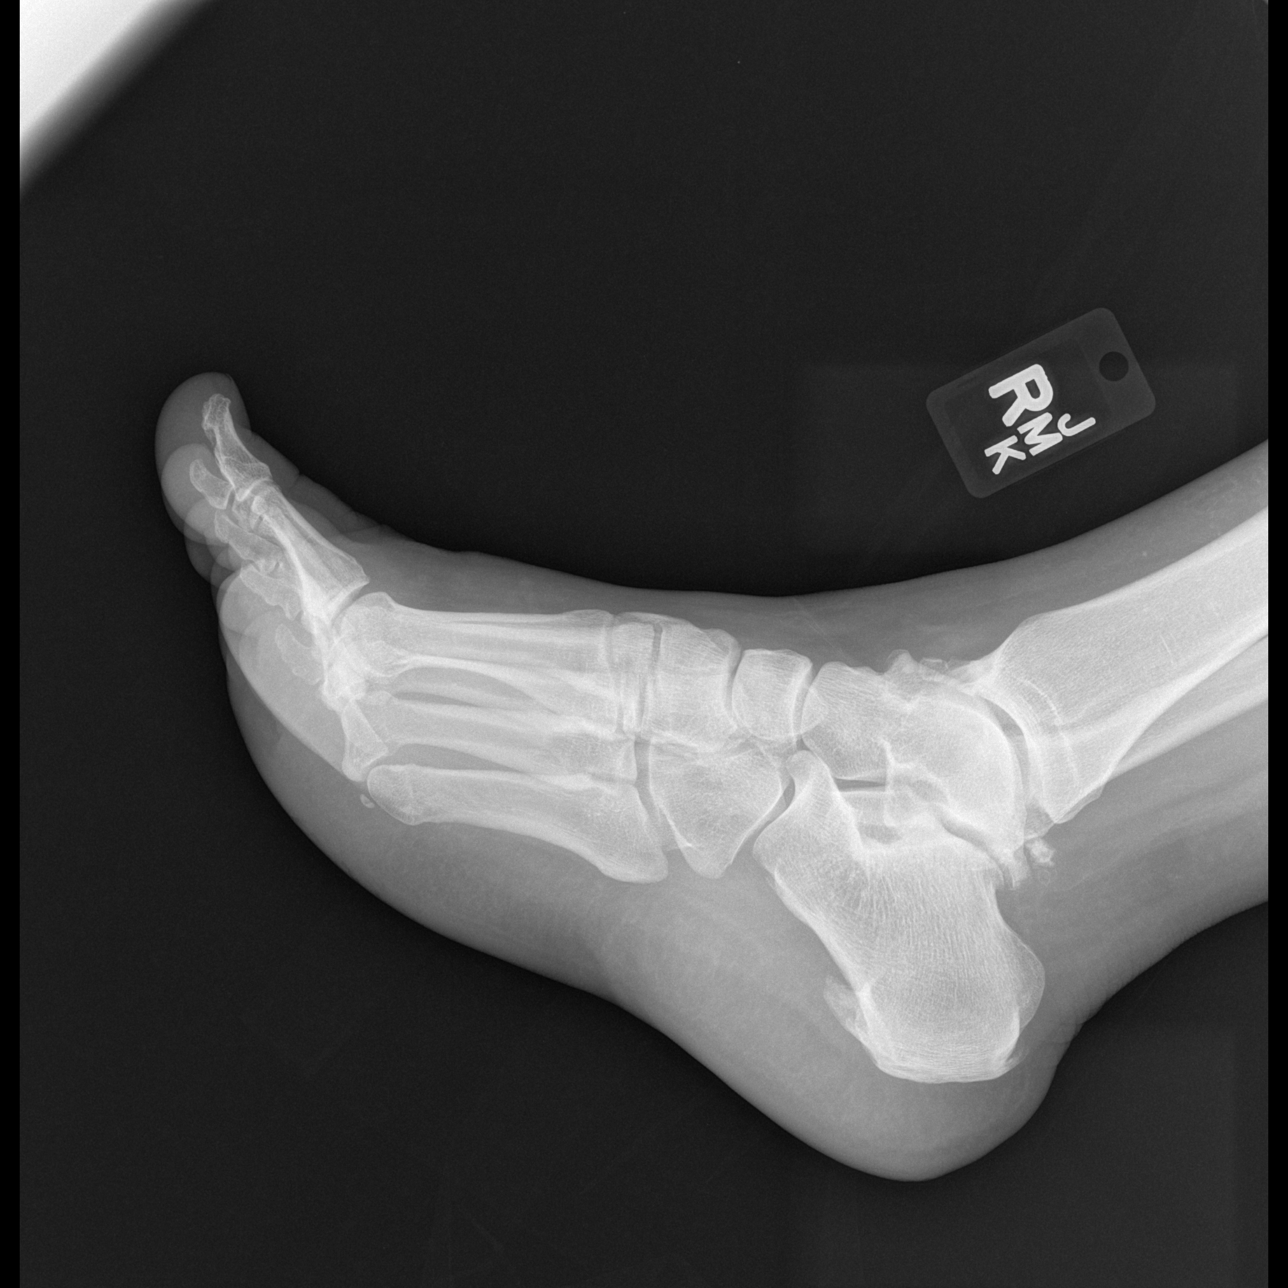

[3 of 3 positions shown; findings below may reference images not displayed]

FINDINGS: No acute bone or soft tissue abnormality is evident in
the foot.  A moderate sized plantar calcaneal spur is evident.
Degenerative changes are noted at the ankle.
IMPRESSION: 1.  No acute abnormality of the foot.
2.  Degenerative changes of the ankle.
3.  Moderate sized plantar calcaneal spur.

## 2010-02-10 IMAGING — CR DG ANKLE COMPLETE 3+V*R*
3 series · 3 of 3 positions shown · non-contrast
Comparison: None available.

CLINICAL DATA: Status post fall.  Right ankle injury.

RIGHT ANKLE - COMPLETE 3+ VIEW

[t ankle joint ap right]
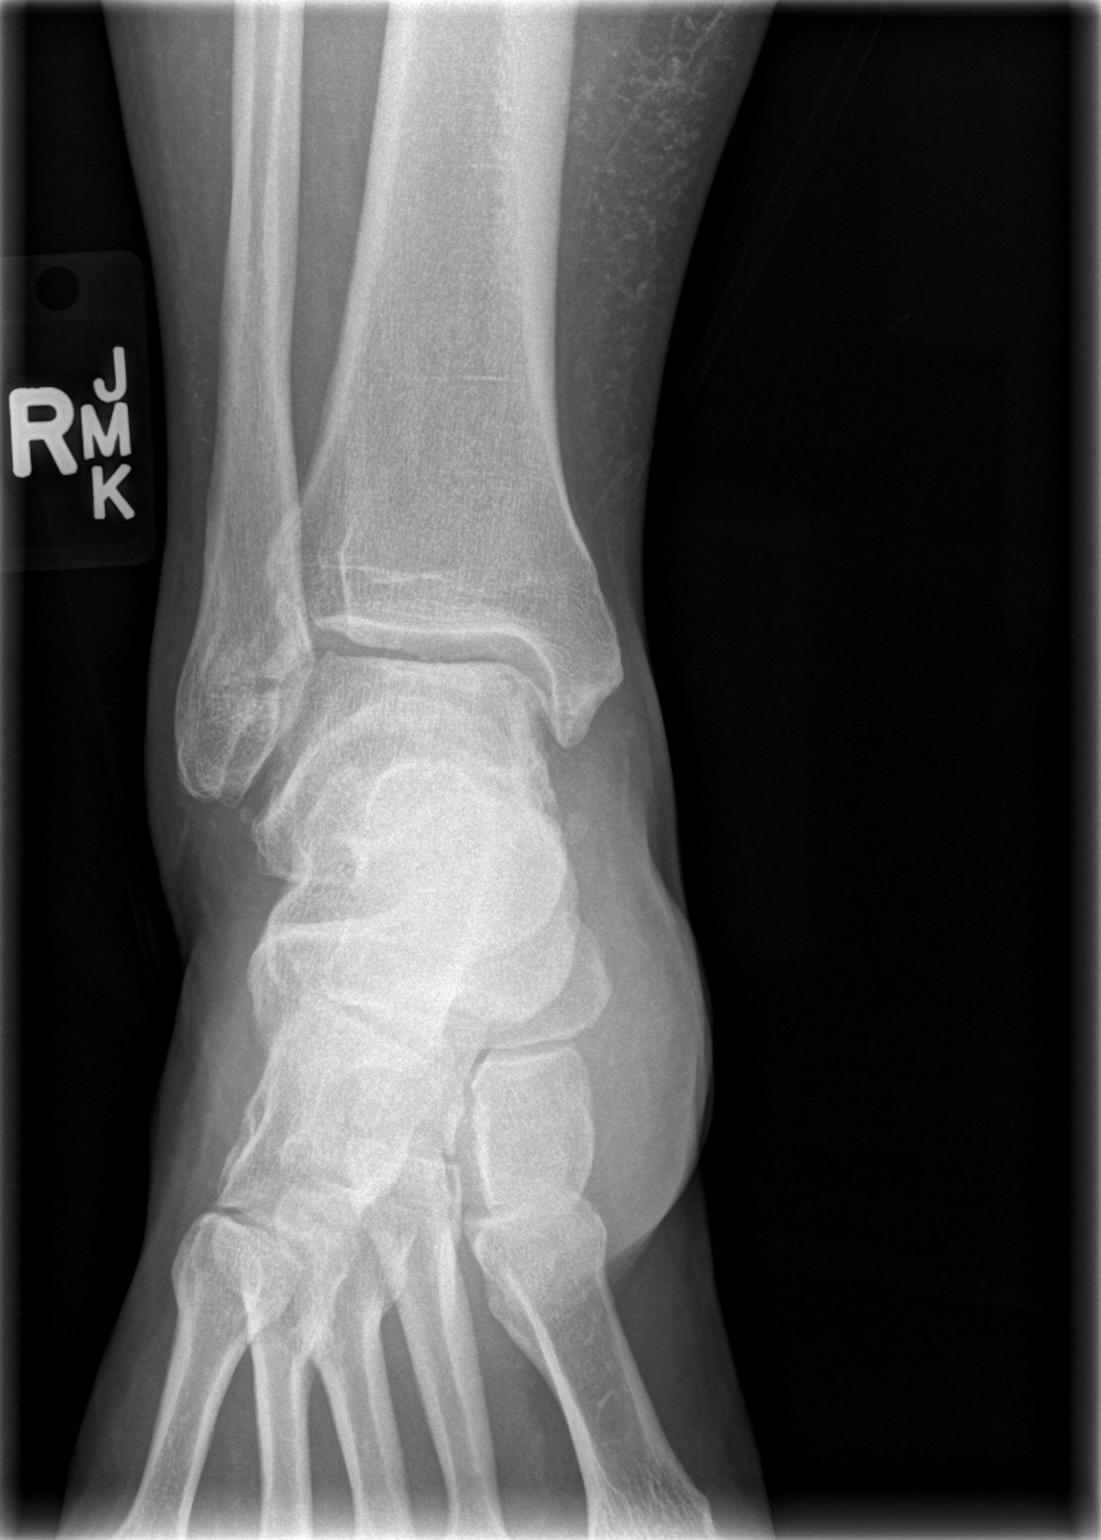

[t ankle joint oblique right]
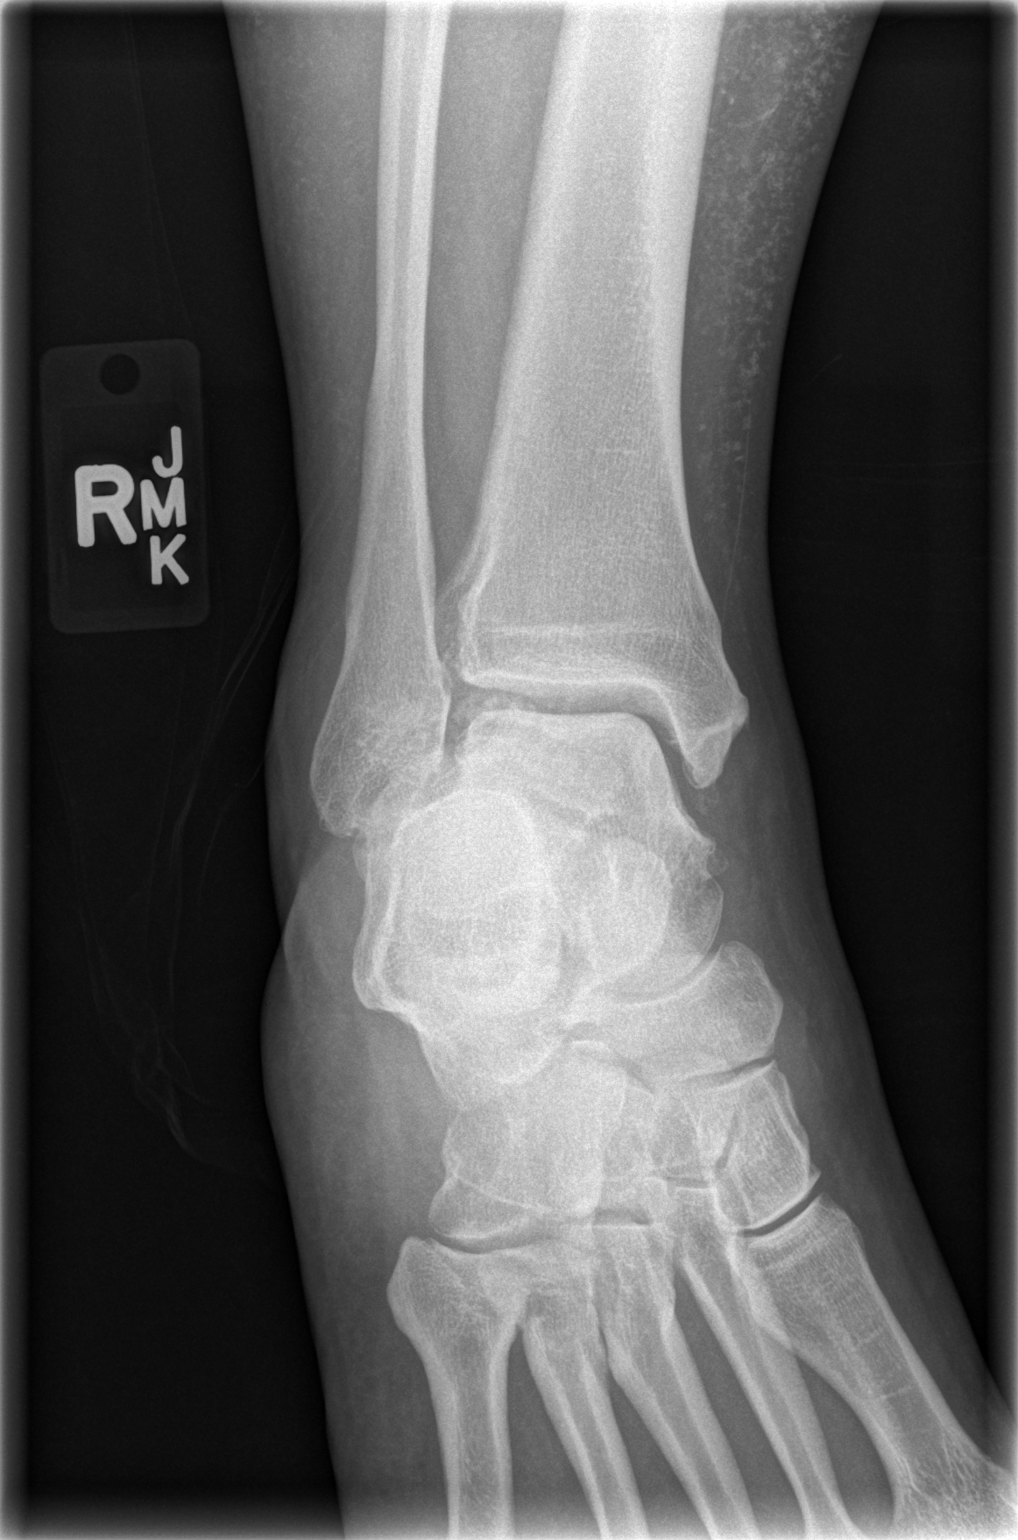

[t ankle joint lat right]
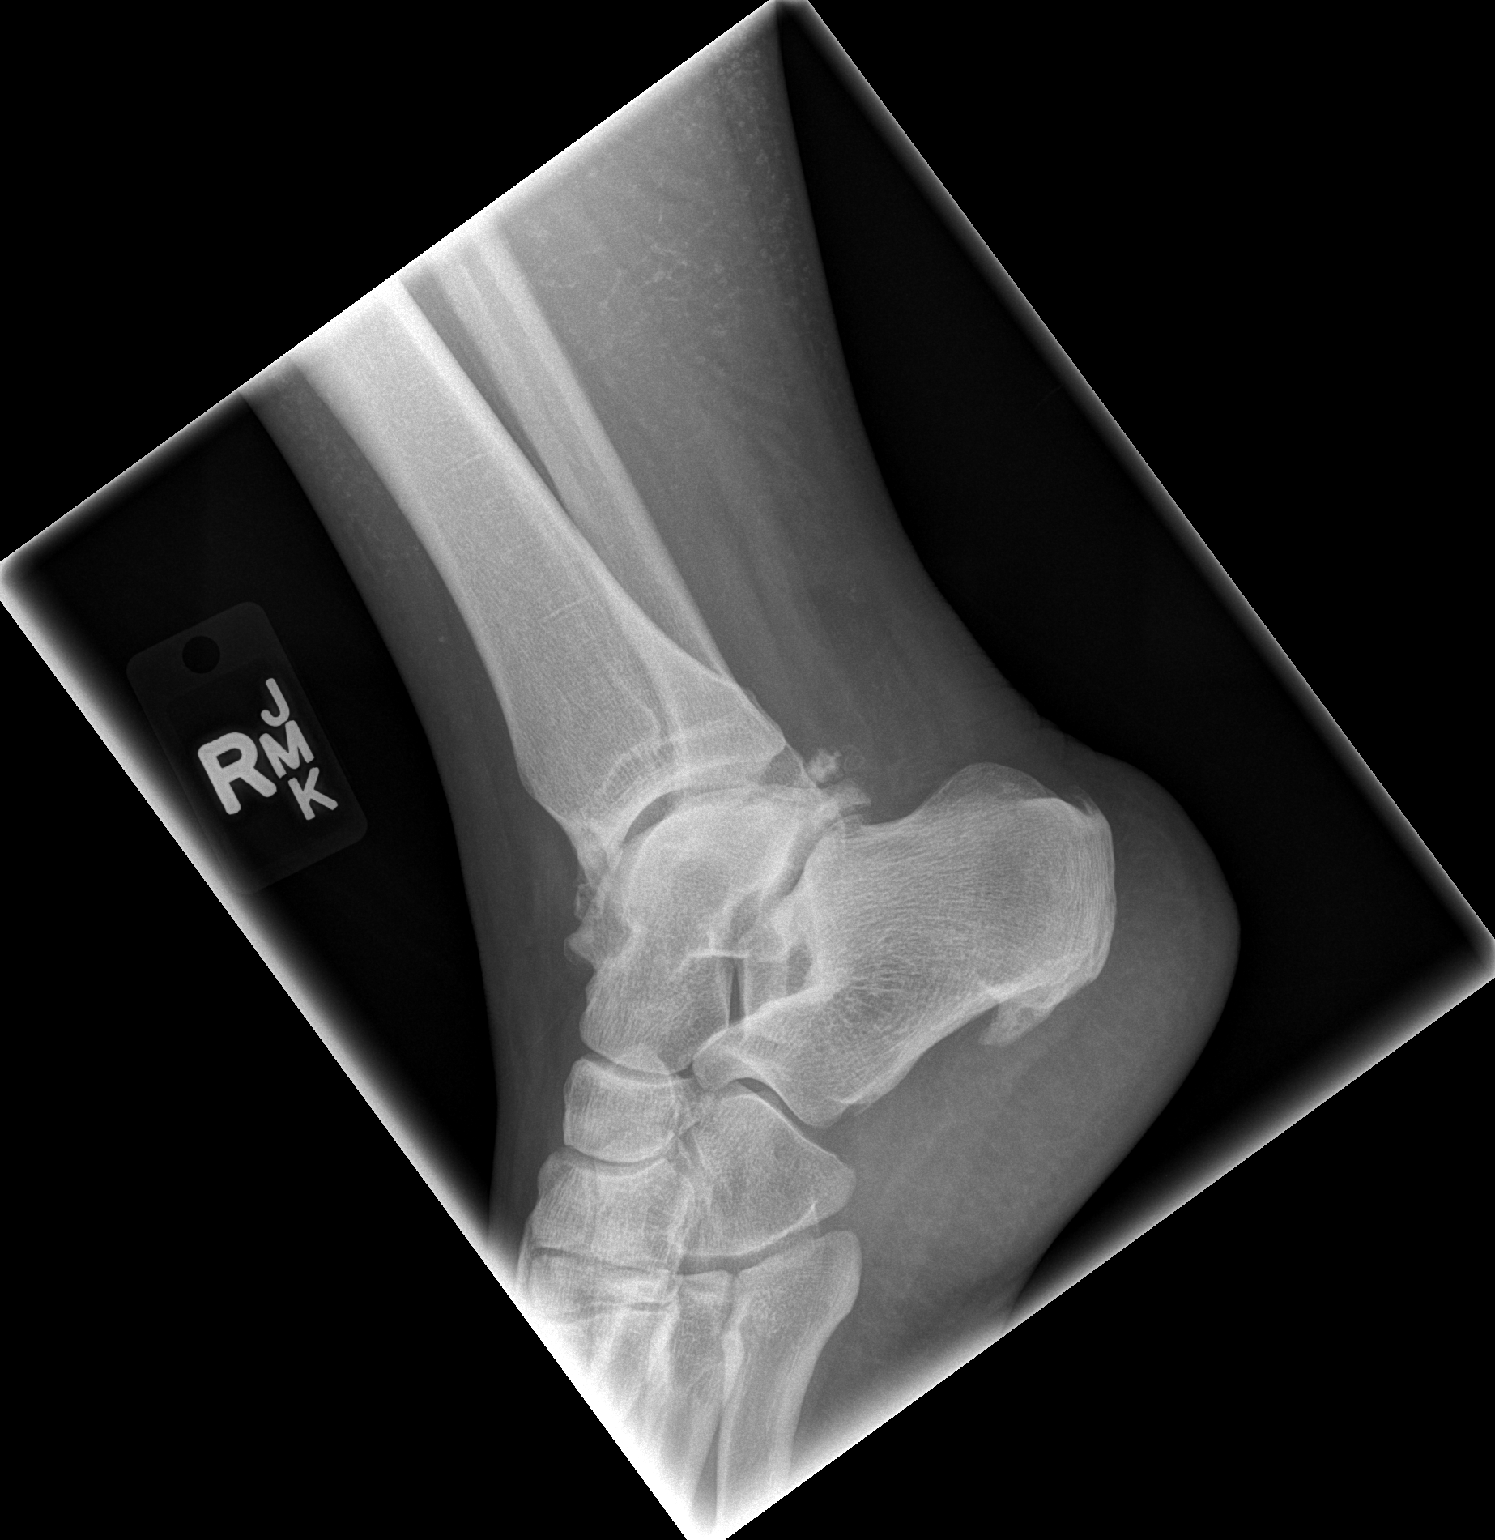

[3 of 3 positions shown; findings below may reference images not displayed]

FINDINGS: There is soft tissue swelling over the lateral
malleolus.  Ankle joint is intact.  There is no evidence for acute
fracture.  Premature degenerative changes are noted in the ankle
joint. Incidental note is made of a small os trigonum.
IMPRESSION: 1.  Soft tissue swelling over lateral malleolus without underlying
fracture.
2.  Premature degenerative changes of the ankle.

## 2010-02-14 ENCOUNTER — Emergency Department (HOSPITAL_COMMUNITY): Admission: EM | Admit: 2010-02-14 | Discharge: 2010-02-14 | Payer: Self-pay | Admitting: Emergency Medicine

## 2010-04-22 ENCOUNTER — Ambulatory Visit (HOSPITAL_BASED_OUTPATIENT_CLINIC_OR_DEPARTMENT_OTHER): Admission: RE | Admit: 2010-04-22 | Discharge: 2010-04-22 | Payer: Self-pay | Admitting: Internal Medicine

## 2010-04-25 ENCOUNTER — Ambulatory Visit: Payer: Self-pay | Admitting: Internal Medicine

## 2010-05-02 ENCOUNTER — Encounter: Admission: RE | Admit: 2010-05-02 | Discharge: 2010-05-02 | Payer: Self-pay | Admitting: Orthopedic Surgery

## 2010-05-11 IMAGING — CT CT HEAD W/O CM
1 of 2 series · 13 of 30 positions shown, 17 images · non-contrast
Comparison: 10/03/2005.

CLINICAL DATA: Dizziness.  Lightheadedness.  Syncope.

CT HEAD WITHOUT CONTRAST
TECHNIQUE: Contiguous axial images were obtained from the base of
the skull through the vertex without contrast.

[Series 2: brain · axial · 0.47mm/px · z∈[+98,+241]mm · 13 of 32 slices shown, 17 images]
[im 3/32  brain]
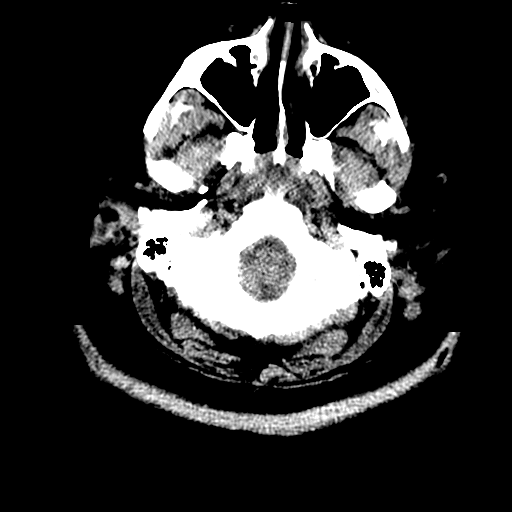
[im 3/32  bone]
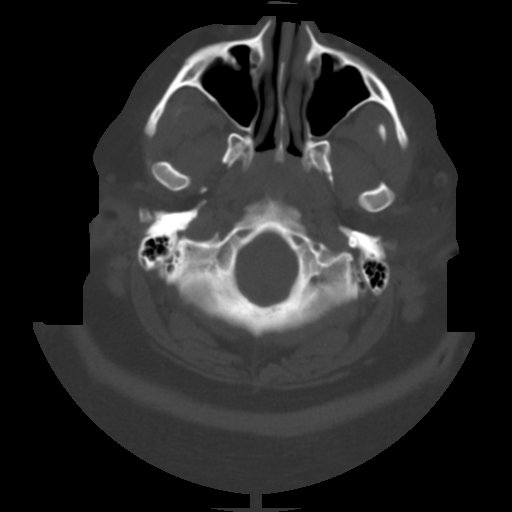
[im 5/32  brain]
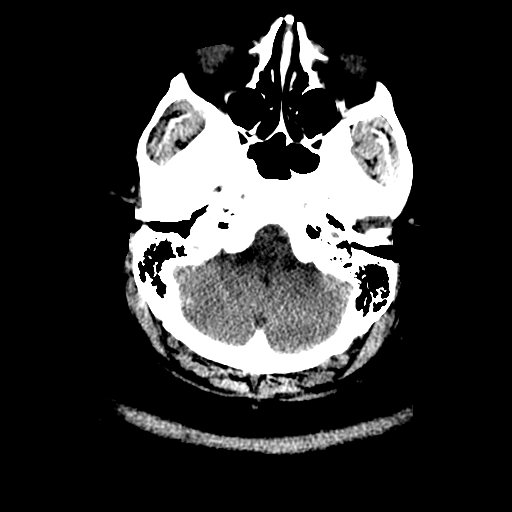
[im 7/32  brain]
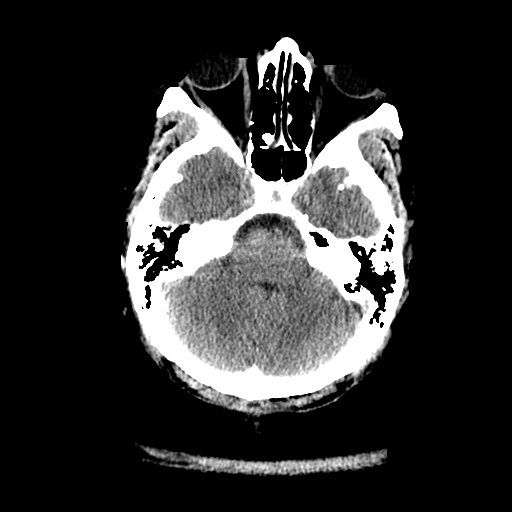
[im 9/32  brain]
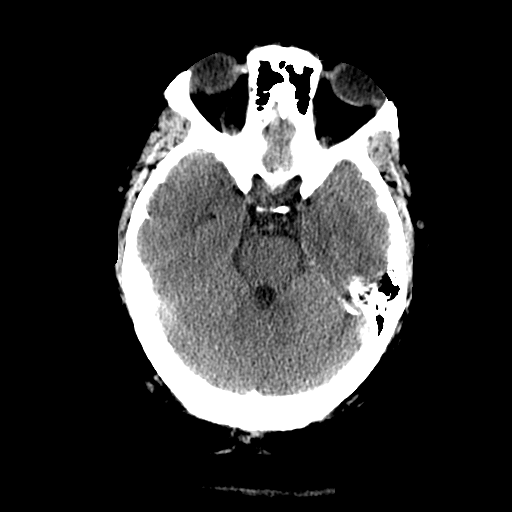
[im 12/32  brain]
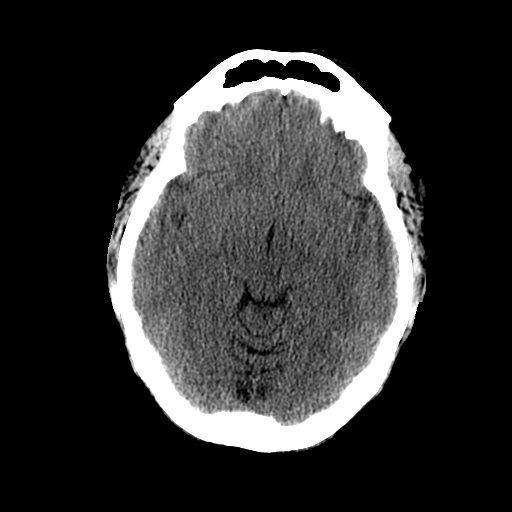
[im 12/32  bone]
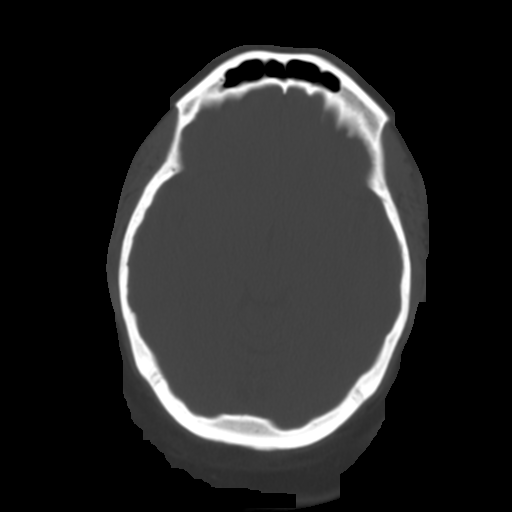
[im 14/32  brain]
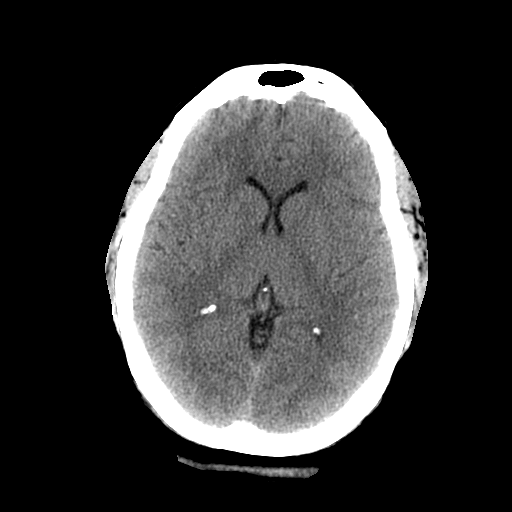
[im 16/32  brain]
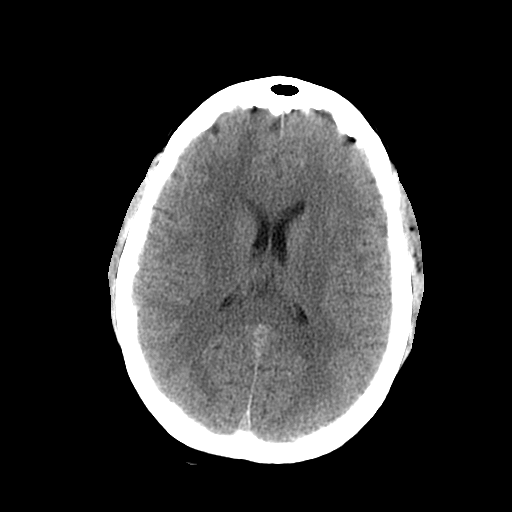
[im 18/32  brain]
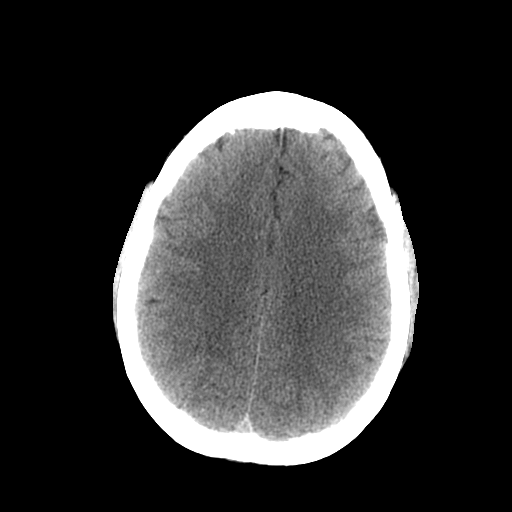
[im 20/32  brain]
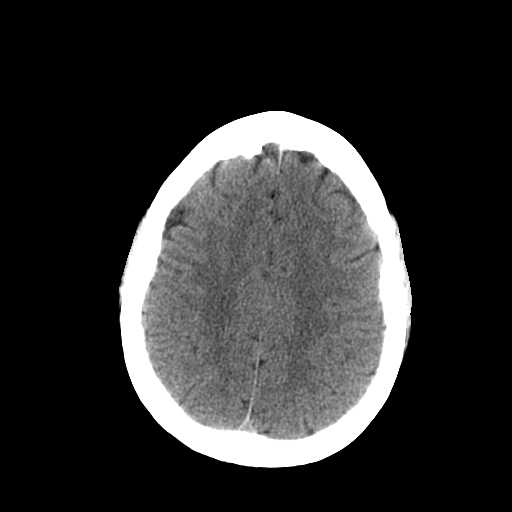
[im 20/32  bone]
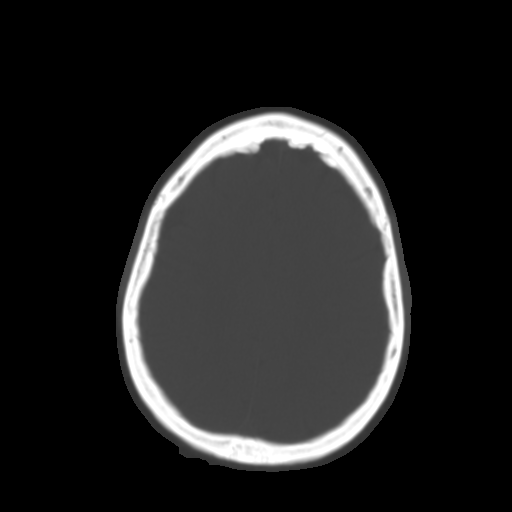
[im 23/32  brain]
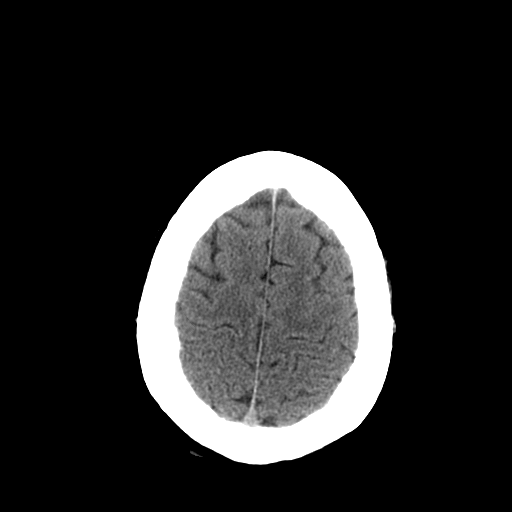
[im 25/32  brain]
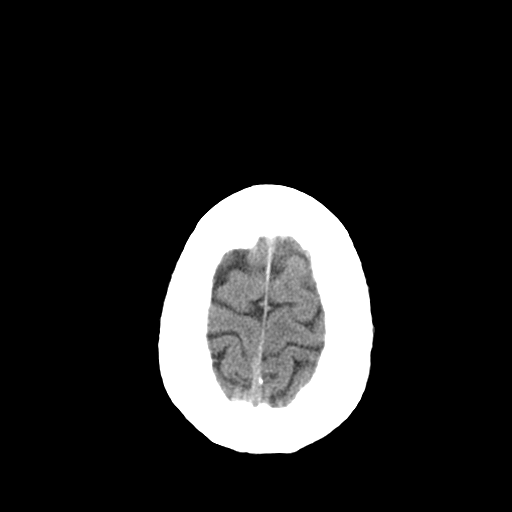
[im 27/32  brain]
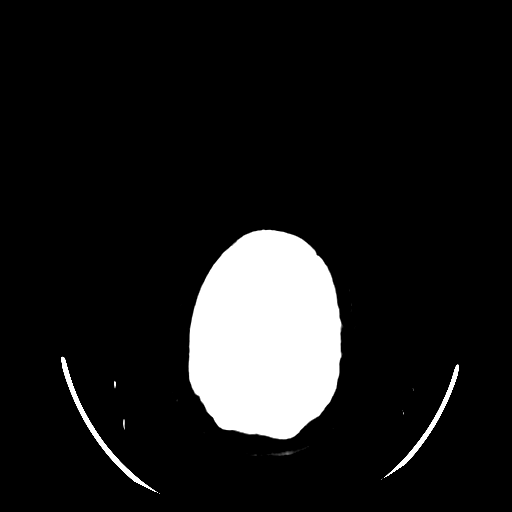
[im 29/32  brain]
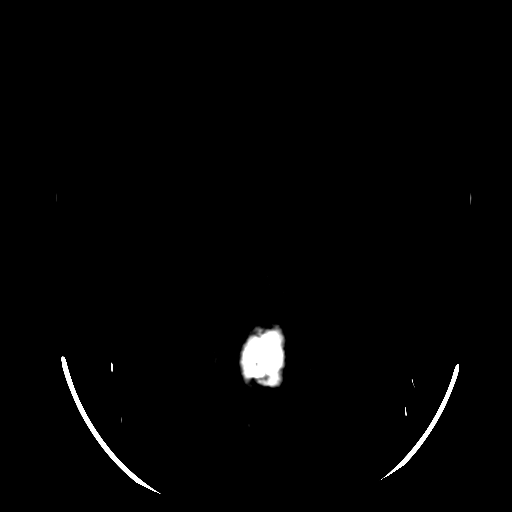
[im 29/32  bone]
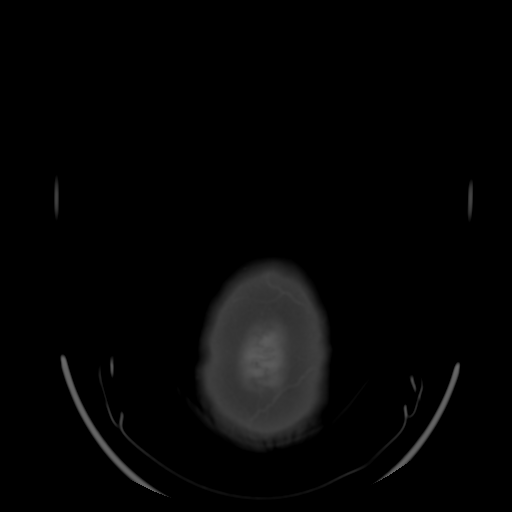

[13 of 30 positions shown; findings below may reference images not displayed]

FINDINGS: No acute intracranial abnormalities are present.
Specifically, there is no evidence for acute infarct, hemorrhage,
mass, hydrocephalus, or extra-axial fluid collection.  The
paranasal sinuses and mastoid air cells are clear.  The osseous
skull is intact.
IMPRESSION: No acute intracranial abnormality or focal lesion to explain
symptoms.

## 2010-05-11 IMAGING — CR DG CHEST 2V
2 series · 2 of 2 positions shown · non-contrast
Comparison: August 04, 2008

CLINICAL DATA: Syncope, dizziness, diabetes

CHEST - 2 VIEW

[w chest pa]
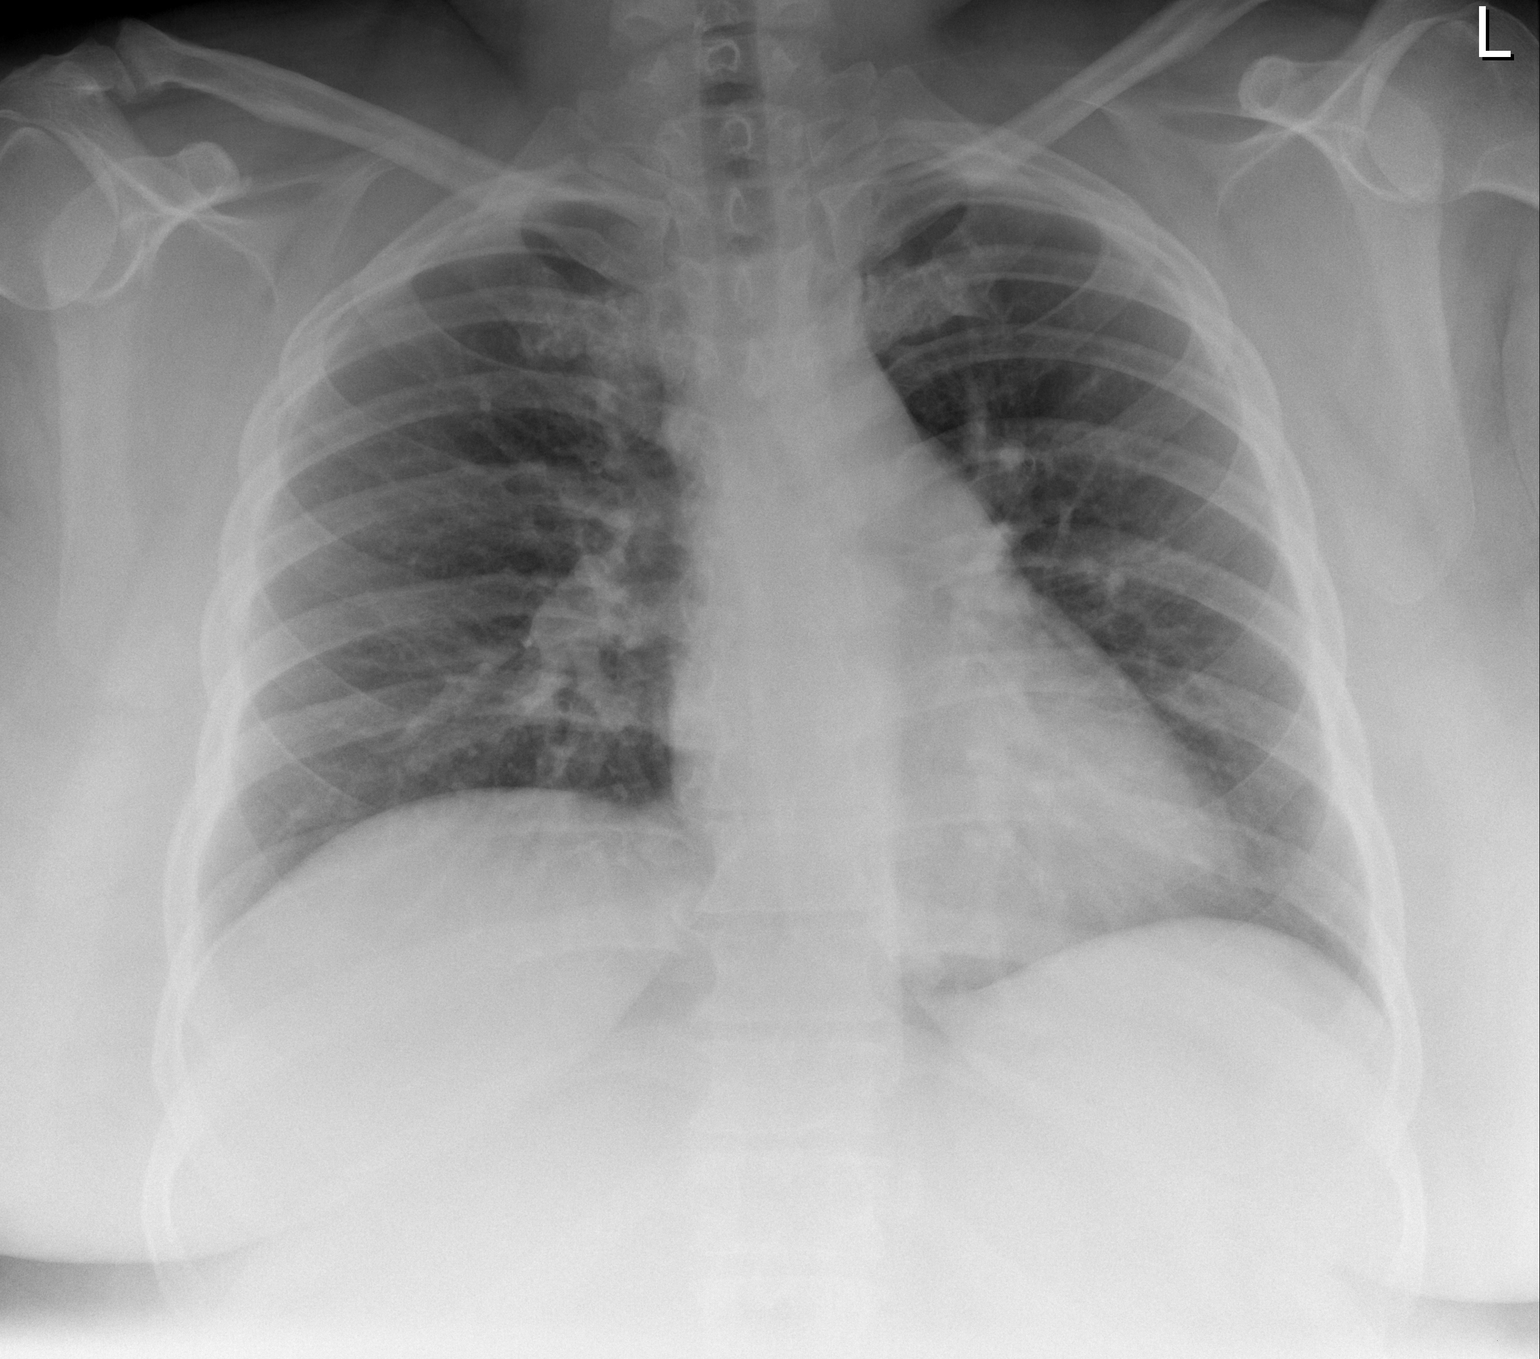

[w chest lat]
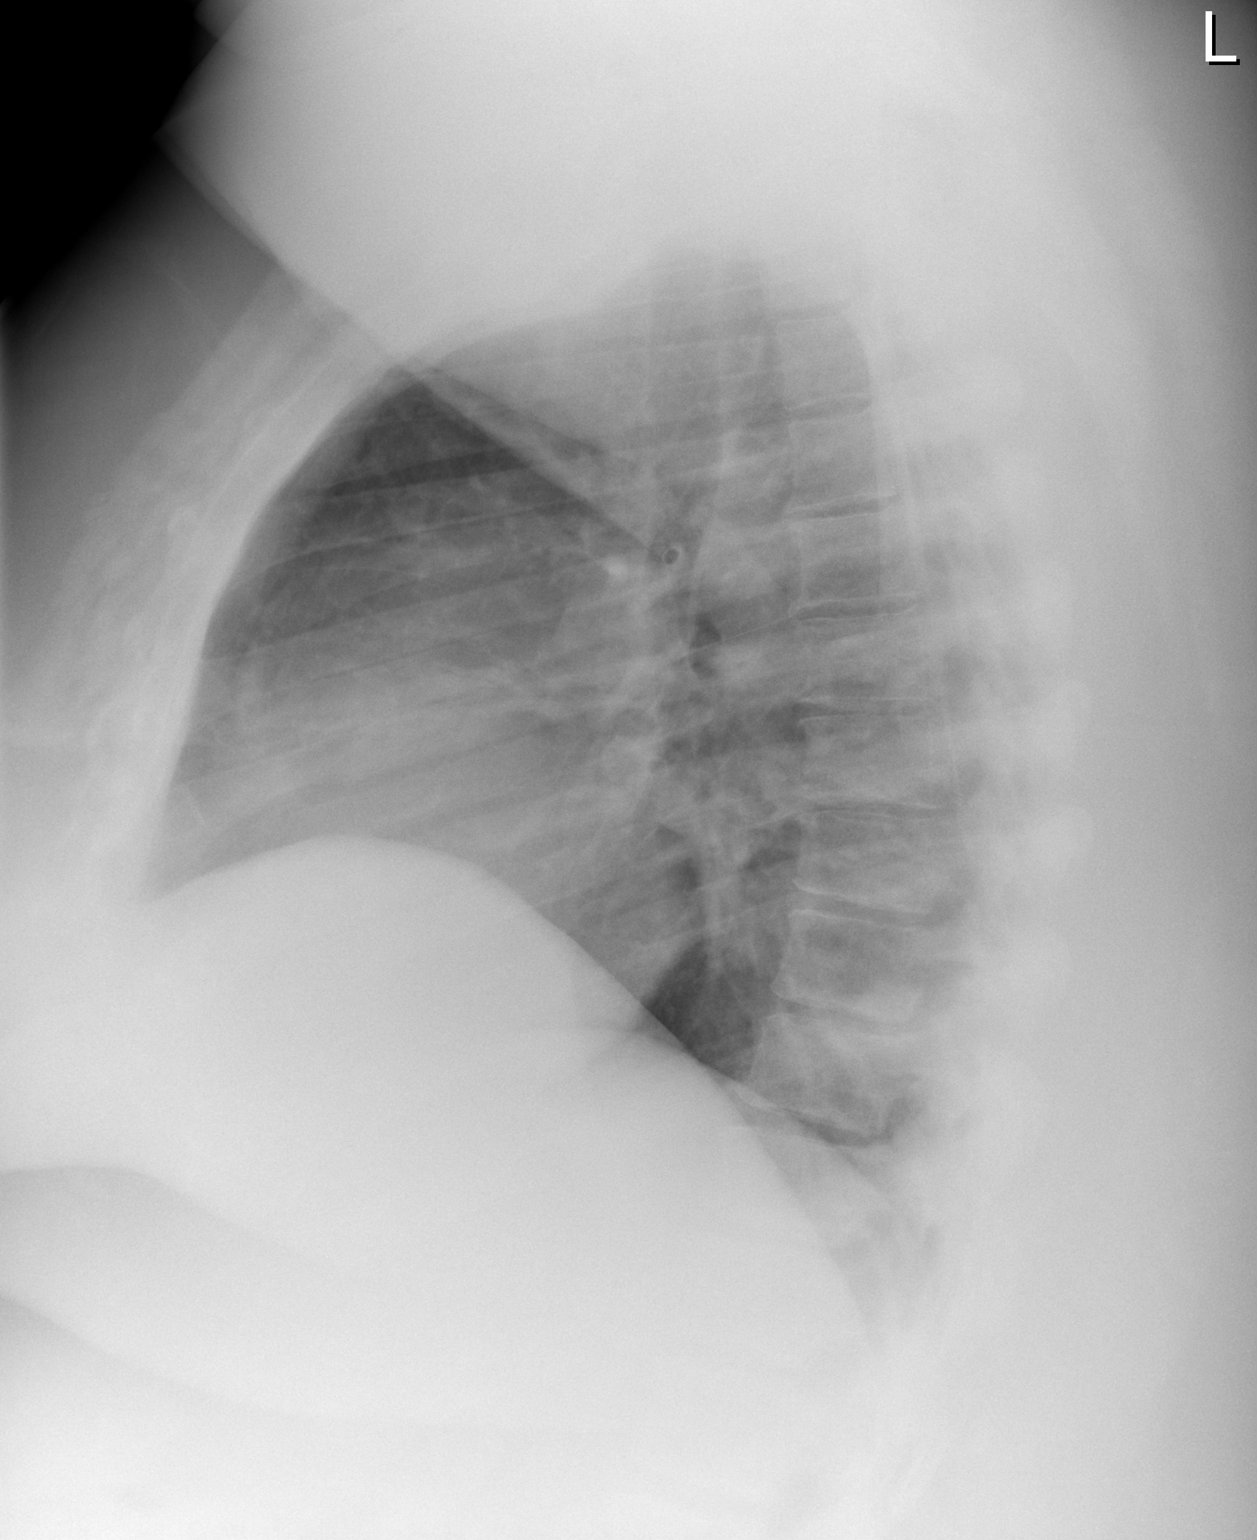

[2 of 2 positions shown; findings below may reference images not displayed]

FINDINGS: The cardiac silhouette, mediastinum, pulmonary
vasculature are within normal limits.  Both lungs are clear.
IMPRESSION: Stable chest x-ray with no evidence of acute cardiac or pulmonary
process.

## 2010-07-01 ENCOUNTER — Emergency Department (HOSPITAL_COMMUNITY): Admission: EM | Admit: 2010-07-01 | Discharge: 2010-07-01 | Payer: Self-pay | Admitting: Emergency Medicine

## 2010-07-17 ENCOUNTER — Ambulatory Visit (HOSPITAL_COMMUNITY): Admission: RE | Admit: 2010-07-17 | Discharge: 2010-07-17 | Payer: Self-pay | Admitting: Orthopedic Surgery

## 2010-07-17 HISTORY — PX: KNEE ARTHROSCOPY: SHX127

## 2010-07-28 ENCOUNTER — Encounter: Admission: RE | Admit: 2010-07-28 | Discharge: 2010-09-14 | Payer: Self-pay | Admitting: Orthopedic Surgery

## 2010-09-06 ENCOUNTER — Emergency Department (HOSPITAL_COMMUNITY): Admission: EM | Admit: 2010-09-06 | Discharge: 2010-09-06 | Payer: Self-pay | Admitting: Emergency Medicine

## 2010-10-07 ENCOUNTER — Ambulatory Visit: Payer: Self-pay | Admitting: Family Medicine

## 2010-10-07 DIAGNOSIS — M940 Chondrocostal junction syndrome [Tietze]: Secondary | ICD-10-CM | POA: Insufficient documentation

## 2010-10-07 DIAGNOSIS — M549 Dorsalgia, unspecified: Secondary | ICD-10-CM | POA: Insufficient documentation

## 2010-10-08 ENCOUNTER — Telehealth (INDEPENDENT_AMBULATORY_CARE_PROVIDER_SITE_OTHER): Payer: Self-pay | Admitting: *Deleted

## 2010-10-09 ENCOUNTER — Telehealth: Payer: Self-pay | Admitting: *Deleted

## 2010-10-13 ENCOUNTER — Encounter: Payer: Self-pay | Admitting: Family Medicine

## 2010-10-16 ENCOUNTER — Telehealth: Payer: Self-pay | Admitting: Family Medicine

## 2010-10-16 ENCOUNTER — Emergency Department (HOSPITAL_COMMUNITY): Admission: EM | Admit: 2010-10-16 | Discharge: 2010-10-17 | Payer: Self-pay | Admitting: Emergency Medicine

## 2010-11-04 ENCOUNTER — Telehealth: Payer: Self-pay | Admitting: Family Medicine

## 2010-11-04 ENCOUNTER — Emergency Department (HOSPITAL_COMMUNITY)
Admission: EM | Admit: 2010-11-04 | Discharge: 2010-11-05 | Payer: Self-pay | Source: Home / Self Care | Admitting: Emergency Medicine

## 2010-11-11 ENCOUNTER — Ambulatory Visit: Payer: Self-pay | Admitting: Family Medicine

## 2010-11-11 ENCOUNTER — Encounter: Payer: Self-pay | Admitting: Family Medicine

## 2010-11-11 DIAGNOSIS — L03221 Cellulitis of neck: Secondary | ICD-10-CM

## 2010-11-11 DIAGNOSIS — L0211 Cutaneous abscess of neck: Secondary | ICD-10-CM | POA: Insufficient documentation

## 2010-11-13 ENCOUNTER — Telehealth: Payer: Self-pay | Admitting: *Deleted

## 2010-11-17 ENCOUNTER — Emergency Department (HOSPITAL_COMMUNITY)
Admission: EM | Admit: 2010-11-17 | Discharge: 2010-11-17 | Payer: Self-pay | Source: Home / Self Care | Admitting: Family Medicine

## 2010-11-17 ENCOUNTER — Telehealth (INDEPENDENT_AMBULATORY_CARE_PROVIDER_SITE_OTHER): Payer: Self-pay | Admitting: *Deleted

## 2010-11-17 ENCOUNTER — Ambulatory Visit: Payer: Self-pay | Admitting: Family Medicine

## 2010-11-17 ENCOUNTER — Encounter: Payer: Self-pay | Admitting: Family Medicine

## 2010-11-19 ENCOUNTER — Emergency Department (HOSPITAL_COMMUNITY)
Admission: EM | Admit: 2010-11-19 | Discharge: 2010-11-19 | Payer: Self-pay | Source: Home / Self Care | Admitting: Family Medicine

## 2010-11-22 ENCOUNTER — Emergency Department (HOSPITAL_COMMUNITY)
Admission: EM | Admit: 2010-11-22 | Discharge: 2010-11-22 | Payer: Self-pay | Source: Home / Self Care | Admitting: Emergency Medicine

## 2010-12-08 ENCOUNTER — Encounter: Payer: Self-pay | Admitting: *Deleted

## 2010-12-20 ENCOUNTER — Emergency Department (HOSPITAL_COMMUNITY)
Admission: EM | Admit: 2010-12-20 | Discharge: 2010-12-20 | Payer: Self-pay | Source: Home / Self Care | Admitting: Emergency Medicine

## 2010-12-20 ENCOUNTER — Telehealth: Payer: Self-pay | Admitting: Family Medicine

## 2010-12-21 LAB — URINE MICROSCOPIC-ADD ON

## 2010-12-21 LAB — URINALYSIS, ROUTINE W REFLEX MICROSCOPIC
Bilirubin Urine: NEGATIVE
Hgb urine dipstick: NEGATIVE
Ketones, ur: NEGATIVE mg/dL
Leukocytes, UA: NEGATIVE
Nitrite: NEGATIVE
Protein, ur: NEGATIVE mg/dL
Specific Gravity, Urine: 1.035 — ABNORMAL HIGH (ref 1.005–1.030)
Urine Glucose, Fasting: >1000 mg/dL — AB
Urobilinogen, UA: 0.2 mg/dL (ref 0.0–1.0)
pH: 5 (ref 5.0–8.0)

## 2010-12-21 LAB — DIFFERENTIAL
Basophils Absolute: 0 10*3/uL (ref 0.0–0.1)
Basophils Relative: 0 % (ref 0–1)
Eosinophils Absolute: 0.2 10*3/uL (ref 0.0–0.7)
Eosinophils Relative: 3 % (ref 0–5)
Lymphocytes Relative: 46 % (ref 12–46)
Lymphs Abs: 4.5 10*3/uL — ABNORMAL HIGH (ref 0.7–4.0)
Monocytes Absolute: 0.8 10*3/uL (ref 0.1–1.0)
Monocytes Relative: 8 % (ref 3–12)
Neutro Abs: 4.2 10*3/uL (ref 1.7–7.7)
Neutrophils Relative %: 44 % (ref 43–77)

## 2010-12-21 LAB — BASIC METABOLIC PANEL
BUN: 14 mg/dL (ref 6–23)
CO2: 29 mEq/L (ref 19–32)
Calcium: 9.1 mg/dL (ref 8.4–10.5)
Chloride: 97 mEq/L (ref 96–112)
Creatinine, Ser: 0.66 mg/dL (ref 0.4–1.2)
GFR calc Af Amer: >60 mL/min (ref >60)
GFR calc non Af Amer: >60 mL/min (ref >60)
Glucose, Bld: 349 mg/dL — ABNORMAL HIGH (ref 70–99)
Potassium: 4.1 mEq/L (ref 3.5–5.1)
Sodium: 135 mEq/L (ref 135–145)

## 2010-12-21 LAB — GLUCOSE, CAPILLARY
Glucose-Capillary: 380 mg/dL — ABNORMAL HIGH (ref 70–99)
Glucose-Capillary: 317 mg/dL — ABNORMAL HIGH (ref 70–99)
Glucose-Capillary: 322 mg/dL — ABNORMAL HIGH (ref 70–99)

## 2010-12-21 LAB — CBC
HCT: 42.9 % (ref 36.0–46.0)
Hemoglobin: 14.1 g/dL (ref 12.0–15.0)
MCH: 30.3 pg (ref 26.0–34.0)
MCHC: 32.9 g/dL (ref 30.0–36.0)
MCV: 92.1 fL (ref 78.0–100.0)
Platelets: 284 10*3/uL (ref 150–400)
RBC: 4.66 MIL/uL (ref 3.87–5.11)
RDW: 13 % (ref 11.5–15.5)
WBC: 9.7 10*3/uL (ref 4.0–10.5)

## 2010-12-21 LAB — PREGNANCY, URINE: Preg Test, Ur: NEGATIVE

## 2010-12-27 ENCOUNTER — Encounter: Payer: Self-pay | Admitting: Emergency Medicine

## 2011-01-05 NOTE — Progress Notes (Signed)
  Phone Note Call from Patient   Caller: Patient Call For: [phone] Summary of Call: Pt received meds for nebulizer that she doesn't have.  Need an rx to take to [facility] to get machine. Initial call taken by: Abundio Miu,  October 09, 2010 2:06 PM  Follow-up for Phone Call        nebulizer form prescribed by mistake. corrected to puffer. please send new Rx. Follow-up by: Helane Rima DO,  October 09, 2010 3:48 PM    New/Updated Medications: PROAIR HFA 108 (90 BASE) MCG/ACT  AERS (ALBUTEROL SULFATE) 2 inh q4h as needed shortness of breath Prescriptions: PROAIR HFA 108 (90 BASE) MCG/ACT  AERS (ALBUTEROL SULFATE) 2 inh q4h as needed shortness of breath  #1 x 6   Entered by:   [name] CMA   Authorized by:   Helane Rima DO   Signed by:   [name] CMA on 10/09/2010   Method used:   Telephoned to ...       [facility] (retail)       [address]       [place], [place]  [address]       Ph: [phone]       Fax: [phone]   RxID:   [id number]

## 2011-01-05 NOTE — Letter (Signed)
Summary: Out of Work  [facility]  [address]   [place], [place] [address]   Phone: [phone]  Fax: [phone]    November 11, 2010   Employee:  [employer] SANJURJO    To Whom It May Concern:   This patient was seen at our clinic today.    If you need additional information, please feel free to contact our office.         Sincerely,    Helane Rima DO  Appended Document: Out of Work faxed to [place] at [phone]

## 2011-01-05 NOTE — Progress Notes (Signed)
  Phone Note Call from Patient   Caller: Patient Summary of Call: throwing up and diarrhea for the last 2 hours.  Abd pain as well.  Insulin with meal and lantus 40mg .  CBG 594 and 431.  numbers usually run 200s.  having increasing abd pain, shaking in hands and complaining of hand numbness.  recommended that pt go to ED for evaluation.  Pt agrees. Initial call taken by: Ellery Plunk MD,  November 04, 2010 10:23 PM

## 2011-01-05 NOTE — Miscellaneous (Signed)
Summary: ROI  ROI   Imported By: Bradly Bienenstock 10/13/2010 17:00:44  _____________________________________________________________________  External Attachment:    Type:   Image     Comment:   External Document

## 2011-01-05 NOTE — Progress Notes (Signed)
  Phone Note Call from Patient   Summary of Call: has not been feeling well for several days- meaning sluggish, SOB, tired.  Usually takes Lantus 40 units two times a day.  CBG was 450, she took 4 units of novolog and is still high 450, and gave 15 more units of novolog- 400.  Patient would like to come to ER.  Advised [facility] hospital. Initial call taken by: Delbert Harness MD,  October 16, 2010 9:51 PM

## 2011-01-05 NOTE — Assessment & Plan Note (Signed)
Summary: NP,tcb   Vital Signs:  Patient profile:   35 year old female Height:      52.75 inches Weight:      364.9 pounds BMI:     92.53 Temp:     98.4 degrees F Pulse rate:   966 / minute BP sitting:   127 / 86  (left arm)  Vitals Entered By: Starleen Blue RN (October 07, 2010 9:58 AM) CC: NP Is Patient Diabetic? Yes Pain Assessment Patient in pain? no        Primary Care Provider:  Helane Hall  CC:  NP.  History of Present Illness: 35 yo F:  1. Back Pain: Chronic, worse since gaining more weight, both sides, no radiation, worse with flexion, better with rest and current pain medications - Voltaren and Ultram, no radiation, no bowel/bladder incontinence, no numbness/tinglings in feet, no weakness in legs, no Hx injury or fall.  2. DM: Lantus 70 units daily, Novolog SS: < 150 = none, 151-200 = 2 units, 201-250 = 4 units, 251-250 = 6 units, 301-350 = 8 units, 351-400 = 10 units, > 400 = 15 units and call MD. Underwent DM education already but would be interested in more.  3. CP: x 1 week, left to center, hurts with palpation and deep breathing. No SOB, N/V, diaphoresis, LE edema. No injury. No URI s/s. No cough. Has happened before.  4. OSA: Needs settings adjusted on CPAP.  Habits & Providers  Alcohol-Tobacco-Diet     Tobacco Status: never  Current Medications (verified): 1)  Prinivil 10 Mg  Tabs (Lisinopril) .... Take 1 Tablet By Mouth Once A Day 2)  Amaryl 4 Mg Tabs (Glimepiride) .... One By Mouth Daily 3)  Nexium 40 Mg Cpdr (Esomeprazole Magnesium) .... Take 1 Tab Each Morning 4)  Zoloft 50 Mg Tabs (Sertraline Hcl) .Marland Kitchen.. 1 By Mouth Daily - Prescribed By [facility] 5)  Albuterol Sulfate (2.5 Mg/62ml) 0.083%  Nebu (Albuterol Sulfate) .... As Needed 6)  Advair Diskus 250-50 Mcg/dose Misc (Fluticasone-Salmeterol) .Marland Kitchen.. 1 Puff 2 Times Daily 7)  Lantus Solostar 100 Unit/ml  Soln (Insulin Glargine) .... Use As Directed 8)  Novolog 100 Unit/ml Soln (Insulin Aspart)  .... Use As Directed 9)  Ambien 5 Mg Tabs (Zolpidem Tartrate) .... One By Mouth Daily 10)  Bystolic 5 Mg  Tabs (Nebivolol Hcl) .Marland Kitchen.. 1 By Mouth Daily 11)  Abilify 10 Mg Tabs (Aripiprazole) .... One By Mouth Daily - Prescribed By [facility] 12)  Zyrtec Allergy 10 Mg  Tabs (Cetirizine Hcl) .Marland Kitchen.. 1 Once Daily Prn 13)  Diclofenac Sodium 50 Mg Tbec (Diclofenac Sodium) .... One By Mouth Two Times A Day As Needed For Pain 14)  Tramadol Hcl 50 Mg  Tabs (Tramadol Hcl) .... One By Mouth Two Times A Day As Needed For Breakthrough Pain  Allergies (verified): No Known Drug Allergies  Past History:  Past Medical History: Asthma, Persistent Seasonal Allergies HTN DM, Insulin Requiring GERD Bipolar Disorder Personality Disorder    - Followed at [facility]  Past Surgical History: Right Knee  Family History: Parents alive. Mother with DM. Maternal grandmother died from colon cancer at age 69.  Social History: Lives in [place] with boyfriend, [name], and daughter, [name]. Has a dog. Does not endorse a healthy diet or regular exercise. Completed the 10th grade. Deies tobacco, ETOH, and drugs.  Review of Systems      See HPI  Physical Exam  General:  Obese,in no acute distress; alert, appropriate and cooperative throughout examination. Vitals reviewed. Chest Wall:  TTP along left costochodral joint. Lungs:  Normal respiratory effort, chest expands symmetrically. Lungs are clear to auscultation, no crackles or wheezes. Heart:  Normal rate and regular rhythm. S1 and S2 normal without gallop, murmur, click, rub or other extra sounds. Msk:  TTP along bilateral lumbar paraspinal mm. No TTP along spinus processes. Normal ROM. Neg SLR bilaterally. Pulses:  2 + DP. Extremities:  No edema. Neurologic:  Strength normal in all extremities, sensation intact to light touch, gait normal, and DTRs symmetrical and normal.   Psych:  Oriented X3, normally  interactive, good eye contact, not anxious appearing, and not depressed appearing.     Impression & Recommendations:  Problem # 1:  BACK PAIN (ICD-724.5) Assessment New  No red flags. Continue current regimen. Rec using Ultram sparingly as it increases risk of Serotonin Syndrome with SSRI. Patient agreed to PT. Her updated medication list for this problem includes:    Diclofenac Sodium 50 Mg Tbec (Diclofenac sodium) ..... One by mouth two times a day as needed for pain    Tramadol Hcl 50 Mg Tabs (Tramadol hcl) ..... One by mouth two times a day as needed for breakthrough pain  Orders: Physical Therapy Referral (PT)  Problem # 2:  DIABETES MELLITUS (ICD-250.00) Assessment: Unchanged Uncontrolled per reports of 200s-300s. Increased Lantus to 40 in am and 40 in pm with instructions to record CBGs for [place] to review at next visit. Patient to make appointment with nutritionist and pharmacy DM clinic as well as I will be out of town for 2 weeks.  Her updated medication list for this problem includes:    Prinivil 10 Mg Tabs (Lisinopril) .Marland Kitchen... Take 1 tablet by mouth once a day    Amaryl 4 Mg Tabs (Glimepiride) ..... One by mouth daily    Lantus Solostar 100 Unit/ml Soln (Insulin glargine) ..... Use as directed    Novolog 100 Unit/ml Soln (Insulin aspart) ..... Use as directed  Problem # 3:  COSTOCHONDRITIS, LEFT (ICD-733.6) Assessment: New No red flags. Rx anti-inflammatories. Precautions given.  Problem # 4:  OBSTRUCTIVE SLEEP APNEA (ICD-327.23) Assessment: Unchanged Requesting records.  Complete Medication List: 1)  Prinivil 10 Mg Tabs (Lisinopril) .... Take 1 tablet by mouth once a day 2)  Amaryl 4 Mg Tabs (Glimepiride) .... One by mouth daily 3)  Nexium 40 Mg Cpdr (Esomeprazole magnesium) .... Take 1 tab each morning 4)  Zoloft 50 Mg Tabs (Sertraline hcl) .Marland Kitchen.. 1 by mouth daily - prescribed by [facility] 5)  Albuterol Sulfate (2.5 Mg/41ml) 0.083% Nebu (Albuterol sulfate) .... As  needed 6)  Advair Diskus 250-50 Mcg/dose Misc (Fluticasone-salmeterol) .Marland Kitchen.. 1 puff 2 times daily 7)  Lantus Solostar 100 Unit/ml Soln (Insulin glargine) .... Use as directed 8)  Novolog 100 Unit/ml Soln (Insulin aspart) .... Use as directed 9)  Ambien 5 Mg Tabs (Zolpidem tartrate) .... One by mouth daily 10)  Bystolic 5 Mg Tabs (Nebivolol hcl) .Marland Kitchen.. 1 by mouth daily 11)  Abilify 10 Mg Tabs (Aripiprazole) .... One by mouth daily - prescribed by [facility] 12)  Zyrtec Allergy 10 Mg Tabs (Cetirizine hcl) .Marland Kitchen.. 1 once daily prn 13)  Diclofenac Sodium 50 Mg Tbec (Diclofenac sodium) .... One by mouth two times a day as needed for pain 14)  Tramadol Hcl 50 Mg Tabs (Tramadol hcl) .... One by mouth two times a day as needed for breakthrough pain  Patient Instructions: 1)  It  was nice to meet you today! 2)  We are changing your Lantus to 40 units twice daily. 3)  Make an appointment to see the nutritionist. 4)  Follow up with me in 3 weeks. 5)  We need to get records. 6)  We will call with PT referral. Prescriptions: BYSTOLIC 5 MG  TABS (NEBIVOLOL HCL) 1 BY MOUTH DAILY  #90 x 3   Entered and Authorized by:   Belinda Rima DO   Signed by:   Belinda Rima DO on 10/07/2010   Method used:   Historical   RxID:   [id number] LANTUS SOLOSTAR 100 UNIT/ML  SOLN (INSULIN GLARGINE) use as directed  #3 boxes x 3   Entered and Authorized by:   Belinda Rima DO   Signed by:   Belinda Rima DO on 10/07/2010   Method used:   Historical   RxID:   [id number] ADVAIR DISKUS 250-50 MCG/DOSE MISC (FLUTICASONE-SALMETEROL) 1 puff 2 times daily  #1 x 6   Entered and Authorized by:   Belinda Rima DO   Signed by:   Belinda Rima DO on 10/07/2010   Method used:   Historical   RxID:   [id number]    Orders Added: 1)  Physical Therapy Referral [PT] 2)  [facility] [[address]]  Appended Document: Orders Update    Clinical Lists Changes  Medications: Rx of PRINIVIL 10 MG  TABS (LISINOPRIL)  Take 1 tablet by mouth once a day;  #90 x 1;  Signed;  Entered by: Belinda Rima DO;  Authorized by: Belinda Rima DO;  Method used: Printed then faxed to  Rx of AMARYL 4 MG TABS (GLIMEPIRIDE) one by mouth daily;  #90 x 1;  Signed;  Entered by: Belinda Rima DO;  Authorized by: Belinda Rima DO;  Method used: Printed then faxed to  Rx of NEXIUM 40 MG CPDR (ESOMEPRAZOLE MAGNESIUM) Take 1 tab each morning;  #90 x 1;  Signed;  Entered by: Belinda Rima DO;  Authorized by: Belinda Rima DO;  Method used: Printed then faxed to  Rx of ALBUTEROL SULFATE (2.5 MG/3ML) 0.083%  NEBU (ALBUTEROL SULFATE) as needed;  #1 x 3;  Signed;  Entered by: Belinda Rima DO;  Authorized by: Belinda Rima DO;  Method used: Printed then faxed to  Rx of ADVAIR DISKUS 250-50 MCG/DOSE MISC (FLUTICASONE-SALMETEROL) 1 puff 2 times daily;  #1 x 3;  Signed;  Entered by: Belinda Rima DO;  Authorized by: Belinda Rima DO;  Method used: Printed then faxed to  Rx of LANTUS SOLOSTAR 100 UNIT/ML  SOLN (INSULIN GLARGINE) use as directed;  #3 boxes x 3;  Signed;  Entered by: Belinda Rima DO;  Authorized by: Belinda Rima DO;  Method used: Printed then faxed to  Rx of NOVOLOG 100 UNIT/ML SOLN (INSULIN ASPART) use as directed;  #3 boxes x 3;  Signed;  Entered by: Belinda Rima DO;  Authorized by: Belinda Rima DO;  Method used: Printed then faxed to  Rx of AMBIEN 5 MG TABS (ZOLPIDEM TARTRATE) one by mouth daily;  #90 x 0;  Signed;  Entered by: Belinda Rima DO;  Authorized by: Belinda Rima DO;  Method used: Printed then faxed to  Rx of BYSTOLIC 5 MG  TABS (NEBIVOLOL HCL) 1 BY MOUTH DAILY;  #90 x 3;  Signed;  Entered by: Belinda Rima DO;  Authorized by: Belinda Rima DO;  Method used: Printed then faxed to  Rx of DICLOFENAC SODIUM 50 MG TBEC (DICLOFENAC SODIUM) one by mouth two times  a day as needed for pain;  #60 x 0;  Signed;  Entered by: Belinda Rima DO;  Authorized by: Belinda Rima DO;  Method used: Printed then faxed  to    Prescriptions: DICLOFENAC SODIUM 50 MG TBEC (DICLOFENAC SODIUM) one by mouth two times a day as needed for pain  #60 x 0   Entered and Authorized by:   Belinda Rima DO   Signed by:   Belinda Rima DO on 10/08/2010   Method used:   Printed then faxed to ...         RxID:   [id number] BYSTOLIC 5 MG  TABS (NEBIVOLOL HCL) 1 BY MOUTH DAILY  #90 x 3   Entered and Authorized by:   Belinda Rima DO   Signed by:   Belinda Rima DO on 10/08/2010   Method used:   Printed then faxed to ...         RxID:   [id number] AMBIEN 5 MG TABS (ZOLPIDEM TARTRATE) one by mouth daily  #90 x 0   Entered and Authorized by:   Belinda Rima DO   Signed by:   Belinda Rima DO on 10/08/2010   Method used:   Printed then faxed to ...         RxID:   [id number] NOVOLOG 100 UNIT/ML SOLN (INSULIN ASPART) use as directed  #3 boxes x 3   Entered and Authorized by:   Belinda Rima DO   Signed by:   Belinda Rima DO on 10/08/2010   Method used:   Printed then faxed to ...         RxID:   [id number] LANTUS SOLOSTAR 100 UNIT/ML  SOLN (INSULIN GLARGINE) use as directed  #3 boxes x 3   Entered and Authorized by:   Belinda Rima DO   Signed by:   Belinda Rima DO on 10/08/2010   Method used:   Printed then faxed to ...         RxID:   [id number] ADVAIR DISKUS 250-50 MCG/DOSE MISC (FLUTICASONE-SALMETEROL) 1 puff 2 times daily  #1 x 3   Entered and Authorized by:   Belinda Rima DO   Signed by:   Belinda Rima DO on 10/08/2010   Method used:   Printed then faxed to ...         RxID:   [id number] ALBUTEROL SULFATE (2.5 MG/3ML) 0.083%  NEBU (ALBUTEROL SULFATE) as needed  #1 x 3   Entered and Authorized by:   Belinda Rima DO   Signed by:   Belinda Rima DO on 10/08/2010   Method used:   Printed then faxed to ...         RxID:   [id number] NEXIUM 40 MG CPDR (ESOMEPRAZOLE MAGNESIUM) Take 1 tab each morning  #90 x 1   Entered and Authorized by:   Belinda Rima DO    Signed by:   Belinda Rima DO on 10/08/2010   Method used:   Printed then faxed to ...         RxID:   [id number] AMARYL 4 MG TABS (GLIMEPIRIDE) one by mouth daily  #90 x 1   Entered and Authorized by:   Belinda Rima DO   Signed by:   Belinda Rima DO on 10/08/2010   Method used:   Printed then faxed to ...         RxID:   [id number] PRINIVIL 10 MG  TABS (LISINOPRIL) Take 1 tablet by mouth once a day  #90 x 1  Entered and Authorized by:   Belinda Rima DO   Signed by:   Belinda Rima DO on 10/08/2010   Method used:   Printed then faxed to ...         RxID:   [id number]

## 2011-01-05 NOTE — Progress Notes (Signed)
  Phone Note Other Incoming   Caller: Chief Executive Officer of Call: Spoke with Lupita Leash from a diabetic supply co for NPI on pt.  Orders for supply was written by another physician with [facility].  NPI denied due to needing to discuss with her primary here about order pt. Initial call taken by: Abundio Miu,  October 08, 2010 11:05 AM

## 2011-01-05 NOTE — Progress Notes (Signed)
  Phone Note Call from Patient   Caller: Patient Call For: [phone] Summary of Call: Doxycycline rx was faxed to incorrect company.  Should have been sent to [facility] instead.   Please  resend to them.  Fax# [phone] Initial call taken by: Abundio Miu,  November 13, 2010 2:45 PM    Prescriptions: DOXYCYCLINE HYCLATE 100 MG CAPS (DOXYCYCLINE HYCLATE) Take 1 tab twice a day x 10 days  #20 x 0   Entered by:   [name] CMA   Authorized by:   Helane Rima DO   Signed by:   [name] CMA on 11/13/2010   Method used:   Faxed to ...       [facility] (retail)       [address]       [place], [place]  [address]       Ph: [phone]       Fax: [phone]   RxID:   [id number]

## 2011-01-05 NOTE — Assessment & Plan Note (Signed)
Summary: meds/eo   Vital Signs:  Patient profile:   35 year old female Height:      52.75 inches Weight:      365 pounds BMI:     92.56 Temp:     98.1 degrees F oral BP sitting:   120 / 82  (left arm) Cuff size:   large  Vitals Entered By: [name] CMA (November 11, 2010 3:40 PM) CC: abscess, DM Is Patient Diabetic? Yes Pain Assessment Patient in pain? no        Primary Care Provider:  Helane Rima  CC:  abscess and DM.  History of Present Illness: 35 yo F:  1. Abscess: Back of neck, where CPAP rubs. Has had abscess there before that required I&D. No fever/chills, N/V/D, other rash.  2. DM: Lantus 40 units BID, Novolog SS: < 150 = none, 151-200 = 2 units, 201-250 = 4 units, 251-250 = 6 units, 301-350 = 8 units, 351-400 = 10 units, > 400 = 15 units and call MD. AM are only checked right now - BG have improved to 200-250. Patient admits that she hasn't been taking the Novolog.    Habits & Providers  Alcohol-Tobacco-Diet     Tobacco Status: never  Current Medications (verified): 1)  Prinivil 10 Mg  Tabs (Lisinopril) .... Take 1 Tablet By Mouth Once A Day 2)  Amaryl 4 Mg Tabs (Glimepiride) .... One By Mouth Daily 3)  Nexium 40 Mg Cpdr (Esomeprazole Magnesium) .... Take 1 Tab Each Morning 4)  Zoloft 50 Mg Tabs (Sertraline Hcl) .Marland Kitchen.. 1 By Mouth Daily - Prescribed By [facility] 5)  Proair Hfa 108 (90 Base) Mcg/act  Aers (Albuterol Sulfate) .... 2 Inh Q4h As Needed Shortness of Breath 6)  Advair Diskus 250-50 Mcg/dose Misc (Fluticasone-Salmeterol) .Marland Kitchen.. 1 Puff 2 Times Daily 7)  Lantus Solostar 100 Unit/ml  Soln (Insulin Glargine) .... Use As Directed 8)  Novolog 100 Unit/ml Soln (Insulin Aspart) .... Use As Directed 9)  Ambien 5 Mg Tabs (Zolpidem Tartrate) .... One By Mouth Daily 10)  Bystolic 5 Mg  Tabs (Nebivolol Hcl) .Marland Kitchen.. 1 By Mouth Daily 11)  Abilify 10 Mg Tabs (Aripiprazole) .... One By Mouth Daily - Prescribed By [facility] 12)  Zyrtec Allergy 10 Mg   Tabs (Cetirizine Hcl) .Marland Kitchen.. 1 Once Daily Prn 13)  Diclofenac Sodium 50 Mg Tbec (Diclofenac Sodium) .... One By Mouth Two Times A Day As Needed For Pain 14)  Tramadol Hcl 50 Mg  Tabs (Tramadol Hcl) .... One By Mouth Two Times A Day As Needed For Breakthrough Pain 15)  Doxycycline Hyclate 100 Mg Caps (Doxycycline Hyclate) .... Take 1 Tab Twice A Day X 10 Days  Allergies (verified): No Known Drug Allergies PMH-FH-SH reviewed for relevance  Review of Systems      See HPI  Physical Exam  General:  Obese, in no acute distress; alert, appropriate and cooperative throughout examination. Vitals reviewed. Neck:  4 cm fluctuant, red, warm area. Scar from previous I&D in center of abscess. No streaks. Lungs:  Normal respiratory effort, chest expands symmetrically. Lungs are clear to auscultation, no crackles or wheezes. Heart:  Normal rate and regular rhythm. S1 and S2 normal without gallop, murmur, click, rub or other extra sounds. Pulses:  2 + DP. Extremities:  No edema. Skin:  See Neck.   Impression & Recommendations:  Problem # 1:  CELLULITIS AND ABSCESS OF NECK (ICD-682.1) Assessment New  Consent obtained after risks and benefits discussed. Area cleaned with  betadine and ETOH. Incision with 11 blade,  ~ 1 cm length. Several mL of pus expressed, sample taken for analysis. Pressure bandage applied. Min blood loss, good hemostasis. No complications. Aftercare instructions given. Her updated medication list for this problem includes:    Doxycycline Hyclate 100 Mg Caps (Doxycycline hyclate) .Marland Kitchen... Take 1 tab twice a day x 10 days  Orders: [facility]- Est  Level 4 ([address]) I&D Abcess, simple- [facility] (10060) Miscellaneous Lab Charge-[facility] (60454)  Problem # 2:  DIABETES MELLITUS (ICD-250.00) Assessment: Improved  Increased Lantus. BG book provided. We will go over BG at next visit. Reviewed importance of good BG control. Her updated medication list for this problem includes:    Prinivil 10 Mg Tabs  (Lisinopril) .Marland Kitchen... Take 1 tablet by mouth once a day    Amaryl 4 Mg Tabs (Glimepiride) ..... One by mouth daily    Lantus Solostar 100 Unit/ml Soln (Insulin glargine) ..... Use as directed    Novolog 100 Unit/ml Soln (Insulin aspart) ..... Use as directed  Orders: [facility]- Est  Level 4 (99214)  Complete Medication List: 1)  Prinivil 10 Mg Tabs (Lisinopril) .... Take 1 tablet by mouth once a day 2)  Amaryl 4 Mg Tabs (Glimepiride) .... One by mouth daily 3)  Nexium 40 Mg Cpdr (Esomeprazole magnesium) .... Take 1 tab each morning 4)  Zoloft 50 Mg Tabs (Sertraline hcl) .Marland Kitchen.. 1 by mouth daily - prescribed by [facility] 5)  Proair Hfa 108 (90 Base) Mcg/act Aers (Albuterol sulfate) .... 2 inh q4h as needed shortness of breath 6)  Advair Diskus 250-50 Mcg/dose Misc (Fluticasone-salmeterol) .Marland Kitchen.. 1 puff 2 times daily 7)  Lantus Solostar 100 Unit/ml Soln (Insulin glargine) .... Use as directed 8)  Novolog 100 Unit/ml Soln (Insulin aspart) .... Use as directed 9)  Ambien 5 Mg Tabs (Zolpidem tartrate) .... One by mouth daily 10)  Bystolic 5 Mg Tabs (Nebivolol hcl) .Marland Kitchen.. 1 by mouth daily 11)  Abilify 10 Mg Tabs (Aripiprazole) .... One by mouth daily - prescribed by [facility] 12)  Zyrtec Allergy 10 Mg Tabs (Cetirizine hcl) .Marland Kitchen.. 1 once daily prn 13)  Diclofenac Sodium 50 Mg Tbec (Diclofenac sodium) .... One by mouth two times a day as needed for pain 14)  Tramadol Hcl 50 Mg Tabs (Tramadol hcl) .... One by mouth two times a day as needed for breakthrough pain 15)  Doxycycline Hyclate 100 Mg Caps (Doxycycline hyclate) .... Take 1 tab twice a day x 10 days  Patient Instructions: 1)  It was nice to see you today. 2)  Keep a record of your blood sugars. 3)  Increase your Lantus to 45 units two times a day. 4)  Follow up in one week. Prescriptions: DOXYCYCLINE HYCLATE 100 MG CAPS (DOXYCYCLINE HYCLATE) Take 1 tab twice a day x 10 days  #20 x 0   Entered and Authorized by:   Helane Rima DO   Signed by:    Helane Rima DO on 11/11/2010   Method used:   Printed then faxed to ...       [facility] (mail-order)       [address]       [place], [place]  [address]       Ph: [phone]       Fax: [phone]   RxID:   [id number] DOXYCYCLINE HYCLATE 100 MG CAPS (DOXYCYCLINE HYCLATE) Take 1 tab twice a day x 10 days  #20 x 0   Entered and Authorized by:  Helane Rima DO   Signed by:   Helane Rima DO on 11/11/2010   Method used:   Print then Give to Patient   RxID:   [id number]    Orders Added: 1)  [facility]- Est  Level 4 [30865] 2)  I&D Abcess, simple- [facility] [10060] 3)  Miscellaneous Lab Charge-[facility] [78469]    Prevention & Chronic Care Immunizations   Influenza vaccine: Not documented    Tetanus booster: Not documented    Pneumococcal vaccine: Not documented  Other Screening   Pap smear: Not documented   Smoking status: never  (11/11/2010)  Diabetes Mellitus   HgbA1C: Not documented    Eye exam: Not documented    Foot exam: Not documented   High risk foot: Not documented   Foot care education: Not documented    Urine microalbumin/creatinine ratio: Not documented    Diabetes flowsheet reviewed?: Yes   Progress toward A1C goal: Unchanged  Hypertension   Last Blood Pressure: 120 / 82  (11/11/2010)   Serum creatinine: Not documented   Serum potassium Not documented    Hypertension flowsheet reviewed?: Yes   Progress toward BP goal: At goal  Self-Management Support :   Personal Goals (by the next clinic visit) :     Personal A1C goal: 7  (11/11/2010)     Personal blood pressure goal: 130/80  (11/11/2010)     Personal LDL goal: 100  (11/11/2010)    Patient will work on the following items until the next clinic visit to reach self-care goals:     Medications and monitoring: take my medicines every day, bring all of my medications to every visit  (11/11/2010)     Eating: drink diet soda or water instead of juice or soda, eat more  vegetables, use fresh or frozen vegetables, eat foods that are low in salt, eat baked foods instead of fried foods, eat fruit for snacks and desserts, limit or avoid alcohol  (11/11/2010)     Activity: take a 30 minute walk every day, take the stairs instead of the elevator, park at the far end of the parking lot  (11/11/2010)    Diabetes self-management support: CBG self-monitoring log, Written self-care plan  (11/11/2010)   Diabetes care plan printed    Hypertension self-management support: Written self-care plan  (11/11/2010)   Hypertension self-care plan printed.

## 2011-01-07 NOTE — Miscellaneous (Signed)
Summary: PA required  Clinical Lists Changes PA required for Bystolic. form placed in MD box. Theresia Lo RN  December 08, 2010 5:23 PM If does not meet req, will change to Metoprolol 50 mg by mouth two times a day. Helane Rima DO  December 10, 2010 12:09 PM  FORM FAXED WITH NPI NUMBER. [email or web address] Repress CMA,  December 10, 2010 2:59 PM  Appended Document: Orders Update    Clinical Lists Changes  Medications: Changed medication from BYSTOLIC 5 MG  TABS (NEBIVOLOL HCL) 1 BY MOUTH DAILY to CARVEDILOL 6.25 MG  TABS (CARVEDILOL) 1 tab by mouth two times daily - Signed Rx of CARVEDILOL 6.25 MG  TABS (CARVEDILOL) 1 tab by mouth two times daily;  #180 x 3;  Signed;  Entered by: Helane Rima DO;  Authorized by: Helane Rima DO;  Method used: Print then Give to Patient    Prescriptions: CARVEDILOL 6.25 MG  TABS (CARVEDILOL) 1 tab by mouth two times daily  #180 x 3   Entered and Authorized by:   Helane Rima DO   Signed by:   Helane Rima DO on 12/10/2010   Method used:   Print then Give to Patient   RxID:   [id number]    Appended Document: PA required FORM MAILED TO [facility] [address] [place] [address] (form stated to mail, not to fax)

## 2011-01-07 NOTE — Assessment & Plan Note (Signed)
Summary: f/u/rh   Vital Signs:  Patient profile:   35 year old female Height:      52.75 inches Weight:      365 pounds BMI:     92.56 Temp:     97.8 degrees F oral Pulse rate:   94 / minute BP sitting:   131 / 83  (right arm) Cuff size:   large  Vitals Entered By: [name] CMA (November 17, 2010 8:35 AM) CC: F/U DM, abscess   Primary Care Provider:  Helane Rima  CC:  F/U DM and abscess.  History of Present Illness: 35 yo F:  1. Abscess: Back of neck, where CPAP rubs. Has had abscess there last week that required I&D. Did not pack with gauze, closed very quickly. Patient did not pick up Doxy as instructed. No fever/chills, N/V/D, other rash.  2. DM: Lantus 45 units BID, Novolog SS: < 150 = none, 151-200 = 2 units, 201-250 = 4 units, 251-250 = 6 units, 301-350 = 8 units, 351-400 = 10 units, > 400 = 15 units and call MD. AM are only checked right now - BG have improved to 170-225. Patient admits that she hasn't been taking the Novolog, but will start taking it so that she does not have to increase Lantus.    Current Medications (verified): 1)  Prinivil 10 Mg  Tabs (Lisinopril) .... Take 1 Tablet By Mouth Once A Day 2)  Amaryl 4 Mg Tabs (Glimepiride) .... One By Mouth Daily 3)  Nexium 40 Mg Cpdr (Esomeprazole Magnesium) .... Take 1 Tab Each Morning 4)  Zoloft 50 Mg Tabs (Sertraline Hcl) .Marland Kitchen.. 1 By Mouth Daily - Prescribed By [facility] 5)  Proair Hfa 108 (90 Base) Mcg/act  Aers (Albuterol Sulfate) .... 2 Inh Q4h As Needed Shortness of Breath 6)  Advair Diskus 250-50 Mcg/dose Misc (Fluticasone-Salmeterol) .Marland Kitchen.. 1 Puff 2 Times Daily 7)  Lantus Solostar 100 Unit/ml  Soln (Insulin Glargine) .... Use As Directed 8)  Novolog 100 Unit/ml Soln (Insulin Aspart) .... Use As Directed 9)  Ambien 5 Mg Tabs (Zolpidem Tartrate) .... One By Mouth Daily 10)  Bystolic 5 Mg  Tabs (Nebivolol Hcl) .Marland Kitchen.. 1 By Mouth Daily 11)  Abilify 10 Mg Tabs (Aripiprazole) .... One By Mouth Daily -  Prescribed By [facility] 12)  Zyrtec Allergy 10 Mg  Tabs (Cetirizine Hcl) .Marland Kitchen.. 1 Once Daily Prn 13)  Diclofenac Sodium 50 Mg Tbec (Diclofenac Sodium) .... One By Mouth Two Times A Day As Needed For Pain 14)  Tramadol Hcl 50 Mg  Tabs (Tramadol Hcl) .... One By Mouth Two Times A Day As Needed For Breakthrough Pain 15)  Doxycycline Hyclate 100 Mg Caps (Doxycycline Hyclate) .... Take 1 Tab Twice A Day X 10 Days  Allergies (verified): No Known Drug Allergies PMH-FH-SH reviewed for relevance  Review of Systems      See HPI  Physical Exam  General:  Obese, in no acute distress; alert, appropriate and cooperative throughout examination. Vitals reviewed. Neck:  5-6 cm firm, red, warm area with slightly fluctuant center. Healing incision from previous I&D in center of abscess. No streaks. Patient extremely TTP, limiting exam. Lungs:  Normal respiratory effort, chest expands symmetrically. Lungs are clear to auscultation, no crackles or wheezes. Heart:  Normal rate and regular rhythm. S1 and S2 normal without gallop, murmur, click, rub or other extra sounds. Skin:  See Neck.   Impression & Recommendations:  Problem # 1:  CELLULITIS AND ABSCESS OF NECK (ICD-682.1) Assessment  Deteriorated  Consent obtained after risks and benefits discussed. Area cleaned with betadine and ETOH. Local anesthesia with 1% Lidocaine with epi. Patient did not tolerate injection - c/o pain. Asked to stop. Area able to be better palpated after anesthesia - very firm. Precepted with Dr. Swaziland. Due to patient's c/o pain and firmness of area, will hold on I&D. Rx Doxy and warm compresses x 3 days. If no change, may need to send to surgery for assistance. Red flags given.  Her updated medication list for this problem includes:    Doxycycline Hyclate 100 Mg Caps (Doxycycline hyclate) .Marland Kitchen... Take 1 tab twice a day x 10 days  Orders: [facility]- Est Level  3 (16109)  Problem # 2:  DIABETES MELLITUS (ICD-250.00) Assessment:  Unchanged A1c. Restart mealtime coverage. Her updated medication list for this problem includes:    Prinivil 10 Mg Tabs (Lisinopril) .Marland Kitchen... Take 1 tablet by mouth once a day    Amaryl 4 Mg Tabs (Glimepiride) ..... One by mouth daily    Lantus Solostar 100 Unit/ml Soln (Insulin glargine) ..... Use as directed    Novolog 100 Unit/ml Soln (Insulin aspart) ..... Use as directed  Orders: A1C-[facility] (60454) [facility]- Est Level  3 (09811)  Complete Medication List: 1)  Prinivil 10 Mg Tabs (Lisinopril) .... Take 1 tablet by mouth once a day 2)  Amaryl 4 Mg Tabs (Glimepiride) .... One by mouth daily 3)  Nexium 40 Mg Cpdr (Esomeprazole magnesium) .... Take 1 tab each morning 4)  Zoloft 50 Mg Tabs (Sertraline hcl) .Marland Kitchen.. 1 by mouth daily - prescribed by [facility] 5)  Proair Hfa 108 (90 Base) Mcg/act Aers (Albuterol sulfate) .... 2 inh q4h as needed shortness of breath 6)  Advair Diskus 250-50 Mcg/dose Misc (Fluticasone-salmeterol) .Marland Kitchen.. 1 puff 2 times daily 7)  Lantus Solostar 100 Unit/ml Soln (Insulin glargine) .... Use as directed 8)  Novolog 100 Unit/ml Soln (Insulin aspart) .... Use as directed 9)  Ambien 5 Mg Tabs (Zolpidem tartrate) .... One by mouth daily 10)  Bystolic 5 Mg Tabs (Nebivolol hcl) .Marland Kitchen.. 1 by mouth daily 11)  Abilify 10 Mg Tabs (Aripiprazole) .... One by mouth daily - prescribed by [facility] 12)  Zyrtec Allergy 10 Mg Tabs (Cetirizine hcl) .Marland Kitchen.. 1 once daily prn 13)  Diclofenac Sodium 50 Mg Tbec (Diclofenac sodium) .... One by mouth two times a day as needed for pain 14)  Tramadol Hcl 50 Mg Tabs (Tramadol hcl) .... One by mouth two times a day as needed for breakthrough pain 15)  Doxycycline Hyclate 100 Mg Caps (Doxycycline hyclate) .... Take 1 tab twice a day x 10 days  Patient Instructions: 1)  It was nice to see you today. 2)  Start the Doxycycline. 3)  Warm compresses to area. 4)  Follow up if you develop fever/chills, N/V/D, or any other concerning symptoms. 5)  Follow up  in 3 days.   Orders Added: 1)  A1C-[facility] [83036] 2)  [facility]- Est Level  3 [91478]  Appended Document: A1c  11.1 %    Lab Visit  Laboratory Results   Blood Tests   Date/Time Received: November 17, 2010 9:18 AM  Date/Time Reported: November 17, 2010 11:28 AM   HGBA1C: 11.1%   (Normal Range: Non-Diabetic - 3-6%   Control Diabetic - 6-8%)  Comments: ...............test performed by......Marland KitchenBonnie A. Swaziland, MLS (ASCP)cm    Orders Today:

## 2011-01-07 NOTE — Progress Notes (Signed)
Summary: Triage  Phone Note Call from Patient Call back at [phone]   Reason for Call: Talk to Nurse Summary of Call: abcess was lanced last week, pt sts it came back & is having a lot of pain Initial call taken by: Knox Royalty,  November 17, 2010 3:35 PM  Follow-up for Phone Call        Pt calling still  in a lot of pain.  Have been waiting for nurse to return her call.  Please call back asap.  Site where boil was lanced is very painful.  Boil has returned and appears to be bigger. Follow-up by: Abundio Miu,  November 17, 2010 3:55 PM  Additional Follow-up for Phone Call Additional follow up Details #1::        advised patient to go to urgent care now. Theresia Lo RN  November 17, 2010 4:03 PM  Additional Follow-up by: Theresia Lo RN,  November 17, 2010 4:03 PM

## 2011-01-07 NOTE — Progress Notes (Signed)
Summary: Hyperglycemia   Phone Note Call from Patient   Caller: Patient Details for Reason: Hyperglycemia  Summary of Call: Recieved call from pt about having elevated blood sugars into 500s. Check blood sugar at midnight; it was >500. Took 15 units of novolog, rechecked blood sugar at 3am; it was 340. Pt reports nausea, headache and increased thirst. When asked, pt states that she has gone to the hospital before when feeling like this. Instructed pt that it would be in her best interest to go to ED for evaluation. Told pt that she likely needs IVFs and insulin to bring her sugars down to a better range. Pt expressed understanding.  Doree Albee MD December 20, 2010 4:16 AM

## 2011-01-07 NOTE — Miscellaneous (Signed)
Summary: Consent: IUD  Consent: IUD   Imported By: Knox Royalty 11/17/2010 09:56:36  _____________________________________________________________________  External Attachment:    Type:   Image     Comment:   External Document

## 2011-01-07 NOTE — Miscellaneous (Signed)
Summary: Procedures Consent  Procedures Consent   Imported By: De Nurse 11/25/2010 11:48:37  _____________________________________________________________________  External Attachment:    Type:   Image     Comment:   External Document

## 2011-01-29 ENCOUNTER — Telehealth: Payer: Self-pay | Admitting: *Deleted

## 2011-01-29 ENCOUNTER — Other Ambulatory Visit: Payer: Self-pay | Admitting: Family Medicine

## 2011-01-29 MED ORDER — FLUTICASONE-SALMETEROL 250-50 MCG/DOSE IN AEPB: 1.0000 | INHALATION_SPRAY | Freq: Two times a day (BID) | RESPIRATORY_TRACT | Status: DC

## 2011-01-29 MED ORDER — BUDESONIDE-FORMOTEROL FUMARATE 160-4.5 MCG/ACT IN AERO: 2.0000 | INHALATION_SPRAY | Freq: Two times a day (BID) | RESPIRATORY_TRACT | Status: DC

## 2011-01-29 NOTE — Telephone Encounter (Signed)
Refill request

## 2011-01-29 NOTE — Telephone Encounter (Signed)
Changed Advair to Symbicort. Patient must continue to obtain Abiliy from [facility].

## 2011-01-29 NOTE — Telephone Encounter (Signed)
I sent in new Rx since patient likely does not meet prior auth reqs. I need documentation that the patient has tried Symbicort in the past without relief.

## 2011-01-29 NOTE — Telephone Encounter (Signed)
Per emr, she has not been on symbicort in the past..Bransyn Adami, Esperanza Richters

## 2011-01-29 NOTE — Telephone Encounter (Signed)
advair requires prior auth, they received a 2nd rx for some thing, please process prior auth.

## 2011-01-29 NOTE — Telephone Encounter (Signed)
Physician Pharmacy calling to let us know Advair is not covered by patients insurance.  Will need prior authorization.  Symbicort if drug of choice by her insurance.  Also requesting a refill on patient's Ambilify. Phone # for pharmacy is [phone].  Ileana Ladd

## 2011-01-29 NOTE — Telephone Encounter (Signed)
To pcp

## 2011-02-01 NOTE — Telephone Encounter (Signed)
Prior authorization faxed to [facility] for Symbicort.  Ileana Ladd

## 2011-02-01 NOTE — Telephone Encounter (Signed)
Pharmacy notified that approval has been  received for Symbicort.

## 2011-02-15 LAB — URINALYSIS, ROUTINE W REFLEX MICROSCOPIC
Bilirubin Urine: NEGATIVE
Ketones, ur: NEGATIVE mg/dL
Nitrite: NEGATIVE
Specific Gravity, Urine: 1.038 — ABNORMAL HIGH (ref 1.005–1.030)
pH: 5.5 (ref 5.0–8.0)

## 2011-02-15 LAB — GLUCOSE, CAPILLARY: Glucose-Capillary: 266 mg/dL — ABNORMAL HIGH (ref 70–99)

## 2011-02-15 LAB — URINE MICROSCOPIC-ADD ON

## 2011-02-16 LAB — URINALYSIS, ROUTINE W REFLEX MICROSCOPIC
Glucose, UA: >1000 mg/dL — AB
Hgb urine dipstick: NEGATIVE
Leukocytes, UA: NEGATIVE
Protein, ur: NEGATIVE mg/dL
pH: 6 (ref 5.0–8.0)

## 2011-02-16 LAB — GLUCOSE, CAPILLARY
Glucose-Capillary: 318 mg/dL — ABNORMAL HIGH (ref 70–99)
Glucose-Capillary: 453 mg/dL — ABNORMAL HIGH (ref 70–99)

## 2011-02-16 LAB — CBC
Hemoglobin: 12.4 g/dL (ref 12.0–15.0)
MCH: 30.7 pg (ref 26.0–34.0)
MCH: 30.7 pg (ref 26.0–34.0)
MCHC: 34.7 g/dL (ref 30.0–36.0)
MCV: 88.4 fL (ref 78.0–100.0)
MCV: 90.3 fL (ref 78.0–100.0)
Platelets: 285 10*3/uL (ref 150–400)
Platelets: 331 10*3/uL (ref 150–400)
RBC: 4.04 MIL/uL (ref 3.87–5.11)
RBC: 4.3 MIL/uL (ref 3.87–5.11)
WBC: 10.6 10*3/uL — ABNORMAL HIGH (ref 4.0–10.5)

## 2011-02-16 LAB — DIFFERENTIAL
Basophils Relative: 0 % (ref 0–1)
Eosinophils Absolute: 0.3 10*3/uL (ref 0.0–0.7)
Eosinophils Absolute: 0.4 10*3/uL (ref 0.0–0.7)
Eosinophils Relative: 2 % (ref 0–5)
Eosinophils Relative: 3 % (ref 0–5)
Lymphocytes Relative: 36 % (ref 12–46)
Lymphs Abs: 3.8 10*3/uL (ref 0.7–4.0)
Lymphs Abs: 4.2 10*3/uL — ABNORMAL HIGH (ref 0.7–4.0)
Monocytes Relative: 8 % (ref 3–12)
Neutrophils Relative %: 53 % (ref 43–77)

## 2011-02-16 LAB — BASIC METABOLIC PANEL
BUN: 10 mg/dL (ref 6–23)
CO2: 28 mEq/L (ref 19–32)
Calcium: 8.9 mg/dL (ref 8.4–10.5)
Chloride: 98 mEq/L (ref 96–112)
Creatinine, Ser: 0.73 mg/dL (ref 0.4–1.2)
Glucose, Bld: 472 mg/dL — ABNORMAL HIGH (ref 70–99)

## 2011-02-16 LAB — POCT I-STAT, CHEM 8
HCT: 40 % (ref 36.0–46.0)
Hemoglobin: 13.6 g/dL (ref 12.0–15.0)
Potassium: 3.6 mEq/L (ref 3.5–5.1)
Sodium: 138 mEq/L (ref 135–145)
TCO2: 32 mmol/L (ref 0–100)

## 2011-02-16 LAB — URINE MICROSCOPIC-ADD ON

## 2011-02-16 LAB — CULTURE, ROUTINE-ABSCESS

## 2011-02-16 LAB — D-DIMER, QUANTITATIVE: D-Dimer, Quant: 0.58 ug/mL-FEU — ABNORMAL HIGH (ref 0.00–0.48)

## 2011-02-18 LAB — URINALYSIS, ROUTINE W REFLEX MICROSCOPIC
Bilirubin Urine: NEGATIVE
Ketones, ur: NEGATIVE mg/dL
Nitrite: NEGATIVE
Protein, ur: NEGATIVE mg/dL
Urobilinogen, UA: 0.2 mg/dL (ref 0.0–1.0)

## 2011-02-18 LAB — CBC
HCT: 39.4 % (ref 36.0–46.0)
MCHC: 34 g/dL (ref 30.0–36.0)
MCV: 90 fL (ref 78.0–100.0)
RDW: 12.7 % (ref 11.5–15.5)

## 2011-02-18 LAB — BASIC METABOLIC PANEL
BUN: 9 mg/dL (ref 6–23)
Chloride: 97 mEq/L (ref 96–112)
Creatinine, Ser: 0.59 mg/dL (ref 0.4–1.2)
GFR calc non Af Amer: >60 mL/min (ref >60)
Glucose, Bld: 352 mg/dL — ABNORMAL HIGH (ref 70–99)
Potassium: 4 mEq/L (ref 3.5–5.1)

## 2011-02-18 LAB — GLUCOSE, CAPILLARY: Glucose-Capillary: 330 mg/dL — ABNORMAL HIGH (ref 70–99)

## 2011-02-18 LAB — DIFFERENTIAL
Basophils Absolute: 0 10*3/uL (ref 0.0–0.1)
Basophils Relative: 0 % (ref 0–1)
Eosinophils Relative: 2 % (ref 0–5)
Monocytes Absolute: 0.9 10*3/uL (ref 0.1–1.0)

## 2011-02-19 LAB — BASIC METABOLIC PANEL
CO2: 28 mEq/L (ref 19–32)
Calcium: 8.4 mg/dL (ref 8.4–10.5)
Chloride: 100 mEq/L (ref 96–112)
GFR calc Af Amer: >60 mL/min (ref >60)
Sodium: 135 mEq/L (ref 135–145)

## 2011-02-19 LAB — SURGICAL PCR SCREEN: MRSA, PCR: NEGATIVE

## 2011-02-19 LAB — HCG, SERUM, QUALITATIVE: Preg, Serum: NEGATIVE

## 2011-02-19 LAB — CBC
Hemoglobin: 13.2 g/dL (ref 12.0–15.0)
MCH: 30.8 pg (ref 26.0–34.0)
RBC: 4.29 MIL/uL (ref 3.87–5.11)

## 2011-02-19 LAB — GLUCOSE, CAPILLARY

## 2011-02-20 LAB — D-DIMER, QUANTITATIVE: D-Dimer, Quant: 0.26 ug/mL-FEU (ref 0.00–0.48)

## 2011-02-20 LAB — POCT I-STAT, CHEM 8
Chloride: 100 mEq/L (ref 96–112)
Glucose, Bld: 322 mg/dL — ABNORMAL HIGH (ref 70–99)
HCT: 44 % (ref 36.0–46.0)
Potassium: 3.9 mEq/L (ref 3.5–5.1)

## 2011-02-20 LAB — DIFFERENTIAL
Basophils Absolute: 0 10*3/uL (ref 0.0–0.1)
Lymphocytes Relative: 42 % (ref 12–46)
Monocytes Absolute: 0.6 10*3/uL (ref 0.1–1.0)
Neutro Abs: 4.2 10*3/uL (ref 1.7–7.7)

## 2011-02-20 LAB — POCT CARDIAC MARKERS
CKMB, poc: <1 ng/mL — ABNORMAL LOW (ref 1.0–8.0)
Myoglobin, poc: 47.6 ng/mL (ref 12–200)
Troponin i, poc: <0.05 ng/mL (ref 0.00–0.09)

## 2011-02-20 LAB — URINALYSIS, ROUTINE W REFLEX MICROSCOPIC
Ketones, ur: NEGATIVE mg/dL
Nitrite: NEGATIVE
Protein, ur: >300 mg/dL — AB
Urobilinogen, UA: 0.2 mg/dL (ref 0.0–1.0)

## 2011-02-20 LAB — URINE CULTURE

## 2011-02-20 LAB — CBC
HCT: 41.8 % (ref 36.0–46.0)
RDW: 13 % (ref 11.5–15.5)
WBC: 9.1 10*3/uL (ref 4.0–10.5)

## 2011-02-22 ENCOUNTER — Other Ambulatory Visit: Payer: Self-pay | Admitting: Family Medicine

## 2011-02-22 NOTE — Telephone Encounter (Signed)
Refill request

## 2011-02-26 LAB — POCT I-STAT, CHEM 8
Calcium, Ion: 1.1 mmol/L — ABNORMAL LOW (ref 1.12–1.32)
Glucose, Bld: 355 mg/dL — ABNORMAL HIGH (ref 70–99)
HCT: 41 % (ref 36.0–46.0)
Hemoglobin: 13.9 g/dL (ref 12.0–15.0)

## 2011-02-26 LAB — URINALYSIS, ROUTINE W REFLEX MICROSCOPIC
Bilirubin Urine: NEGATIVE
Glucose, UA: >1000 mg/dL — AB
Hgb urine dipstick: NEGATIVE
Protein, ur: 100 mg/dL — AB
Specific Gravity, Urine: 1.034 — ABNORMAL HIGH (ref 1.005–1.030)
Urobilinogen, UA: 0.2 mg/dL (ref 0.0–1.0)

## 2011-02-26 LAB — URINE MICROSCOPIC-ADD ON

## 2011-03-15 LAB — POCT URINALYSIS DIP (DEVICE)
Glucose, UA: NEGATIVE mg/dL
Nitrite: NEGATIVE
Specific Gravity, Urine: >=1.03 (ref 1.005–1.030)
Urobilinogen, UA: 0.2 mg/dL (ref 0.0–1.0)

## 2011-03-15 LAB — URINE CULTURE

## 2011-03-15 LAB — POCT PREGNANCY, URINE: Preg Test, Ur: NEGATIVE

## 2011-03-17 LAB — DIFFERENTIAL
Basophils Absolute: 0 10*3/uL (ref 0.0–0.1)
Basophils Relative: 0 % (ref 0–1)
Eosinophils Absolute: 0.2 10*3/uL (ref 0.0–0.7)
Eosinophils Relative: 2 % (ref 0–5)
Monocytes Absolute: 0.6 10*3/uL (ref 0.1–1.0)
Monocytes Relative: 7 % (ref 3–12)

## 2011-03-17 LAB — URINE MICROSCOPIC-ADD ON

## 2011-03-17 LAB — URINALYSIS, ROUTINE W REFLEX MICROSCOPIC
Bilirubin Urine: NEGATIVE
Ketones, ur: NEGATIVE mg/dL
Nitrite: NEGATIVE
Urobilinogen, UA: 1 mg/dL (ref 0.0–1.0)

## 2011-03-17 LAB — CBC
HCT: 38.3 % (ref 36.0–46.0)
Hemoglobin: 13.1 g/dL (ref 12.0–15.0)
MCHC: 34.3 g/dL (ref 30.0–36.0)
MCV: 91 fL (ref 78.0–100.0)
RBC: 4.21 MIL/uL (ref 3.87–5.11)
RDW: 12.8 % (ref 11.5–15.5)

## 2011-03-17 LAB — POCT I-STAT, CHEM 8
BUN: 7 mg/dL (ref 6–23)
Calcium, Ion: 1.15 mmol/L (ref 1.12–1.32)
Glucose, Bld: 225 mg/dL — ABNORMAL HIGH (ref 70–99)
TCO2: 28 mmol/L (ref 0–100)

## 2011-03-17 LAB — GLUCOSE, CAPILLARY: Glucose-Capillary: 259 mg/dL — ABNORMAL HIGH (ref 70–99)

## 2011-03-17 LAB — POCT CARDIAC MARKERS

## 2011-03-20 ENCOUNTER — Emergency Department (HOSPITAL_COMMUNITY)
Admission: EM | Admit: 2011-03-20 | Discharge: 2011-03-20 | Disposition: A | Discharge status: Home / Self Care | Payer: Medicaid Other | Attending: Emergency Medicine | Admitting: Emergency Medicine

## 2011-03-20 DIAGNOSIS — I10 Essential (primary) hypertension: Secondary | ICD-10-CM | POA: Insufficient documentation

## 2011-03-20 DIAGNOSIS — L0501 Pilonidal cyst with abscess: Secondary | ICD-10-CM | POA: Insufficient documentation

## 2011-03-20 DIAGNOSIS — E119 Type 2 diabetes mellitus without complications: Secondary | ICD-10-CM | POA: Insufficient documentation

## 2011-03-23 ENCOUNTER — Ambulatory Visit: Payer: Medicaid Other | Admitting: Family Medicine

## 2011-03-23 LAB — GLUCOSE, CAPILLARY: Glucose-Capillary: 241 mg/dL — ABNORMAL HIGH (ref 70–99)

## 2011-03-23 LAB — BASIC METABOLIC PANEL
BUN: 9 mg/dL (ref 6–23)
Calcium: 8.6 mg/dL (ref 8.4–10.5)
Chloride: 99 mEq/L (ref 96–112)
Creatinine, Ser: 0.55 mg/dL (ref 0.4–1.2)
GFR calc Af Amer: >60 mL/min (ref >60)
GFR calc non Af Amer: >60 mL/min (ref >60)

## 2011-03-26 IMAGING — CT CT HEAD W/O CM
1 series · 16 of 30 positions shown, 20 images · non-contrast
Comparison: 04/01/2009

CLINICAL DATA: Fall with head injury, dizziness and nausea.

CT HEAD WITHOUT CONTRAST
TECHNIQUE: Contiguous axial images were obtained from the base of
the skull through the vertex without contrast.

[Series 2: headseq 4.8 h45s · axial · 0.43mm/px · z∈[-163,-33]mm · 16 of 30 slices shown, 20 images]
[im 2/30  brain]
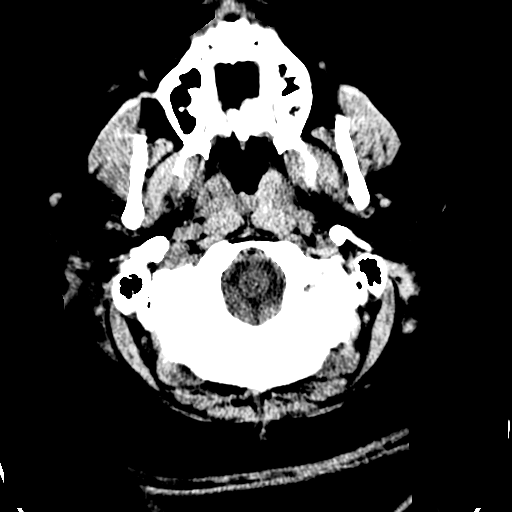
[im 2/30  bone]
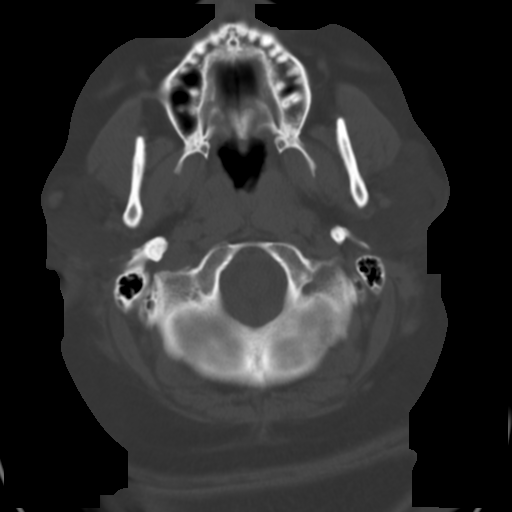
[im 4/30  brain]
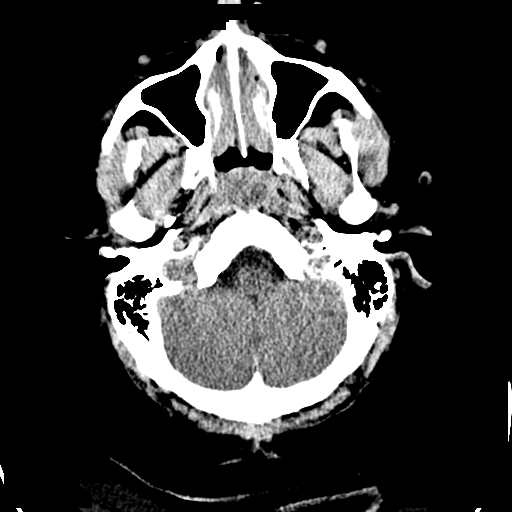
[im 6/30  brain]
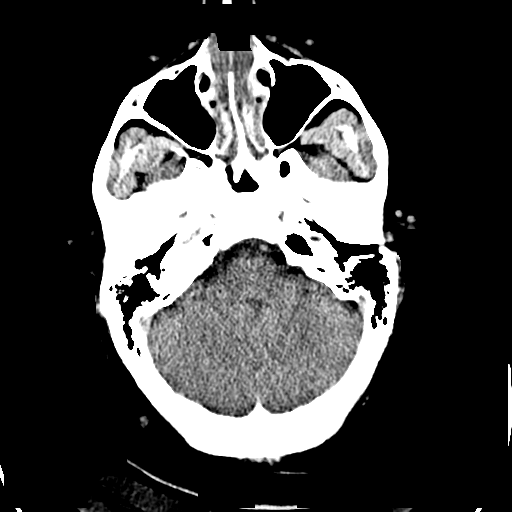
[im 8/30  brain]
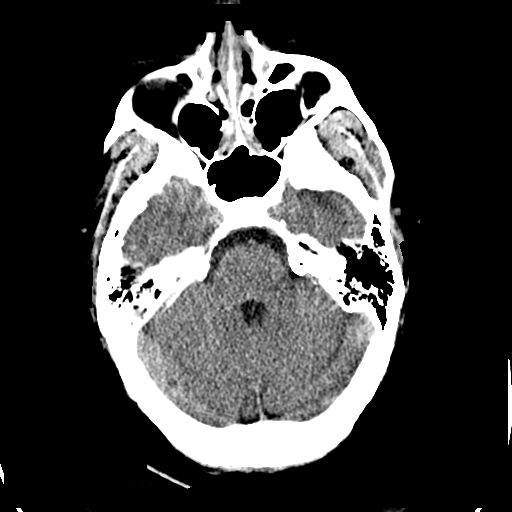
[im 9/30  brain]
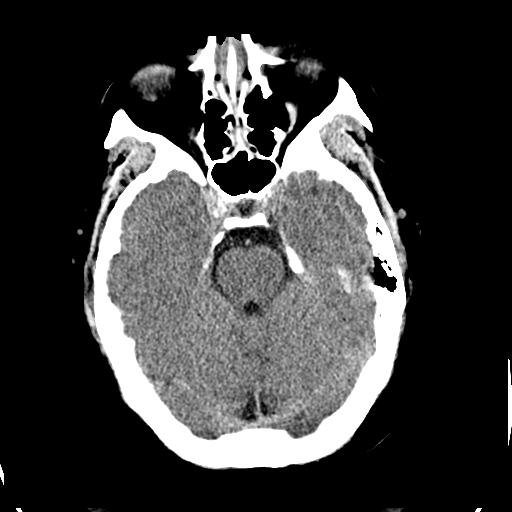
[im 9/30  bone]
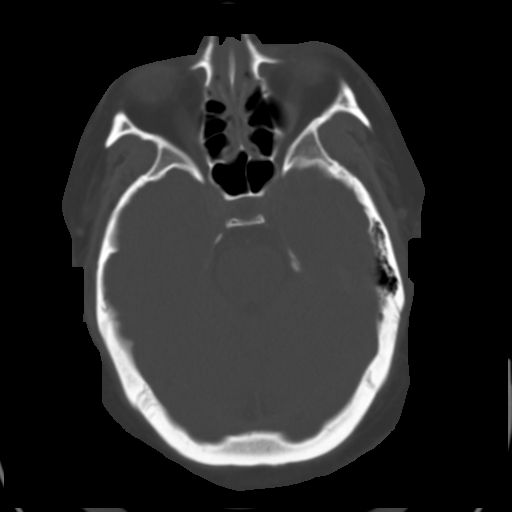
[im 11/30  brain]
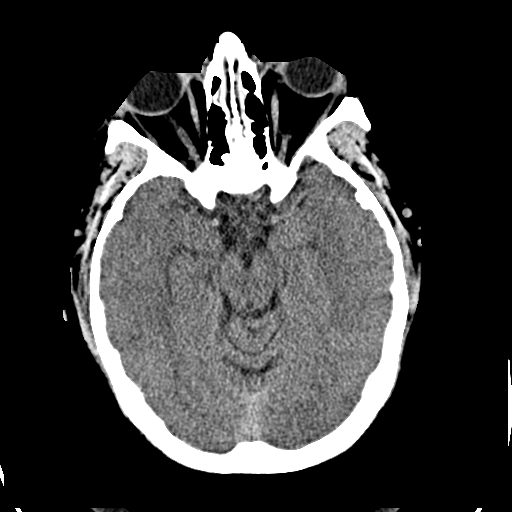
[im 13/30  brain]
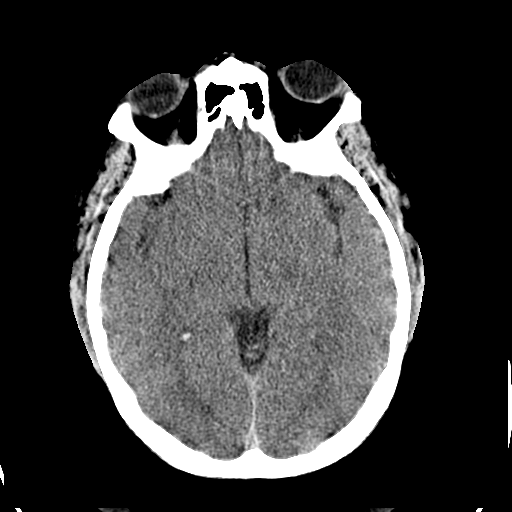
[im 15/30  brain]
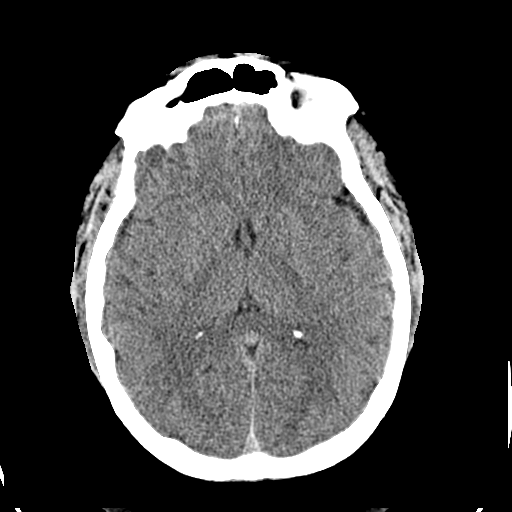
[im 16/30  brain]
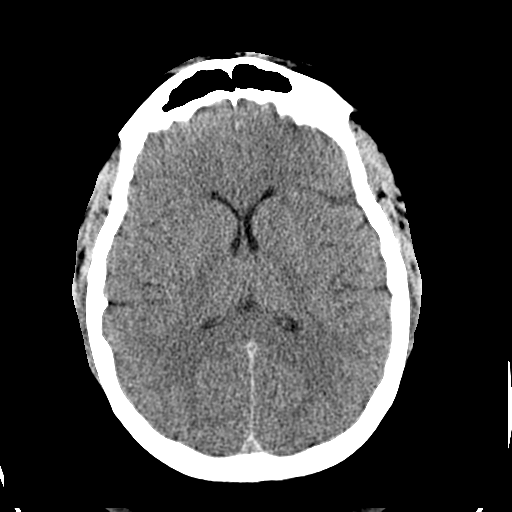
[im 16/30  bone]
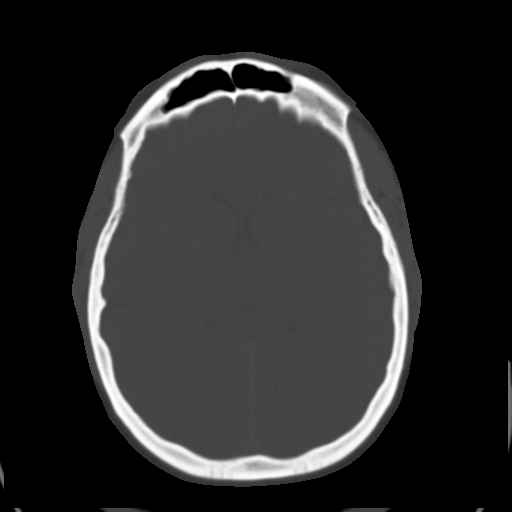
[im 18/30  brain]
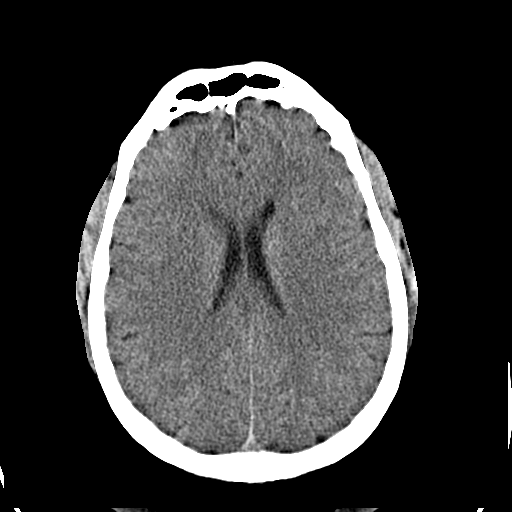
[im 20/30  brain]
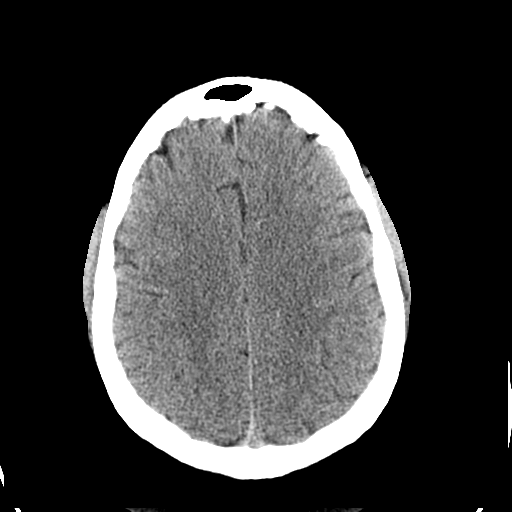
[im 22/30  brain]
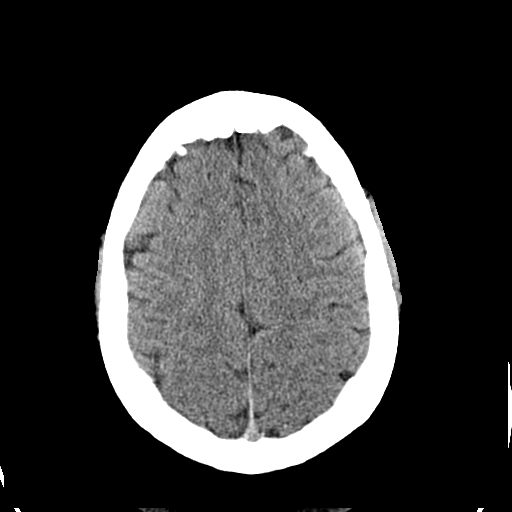
[im 23/30  brain]
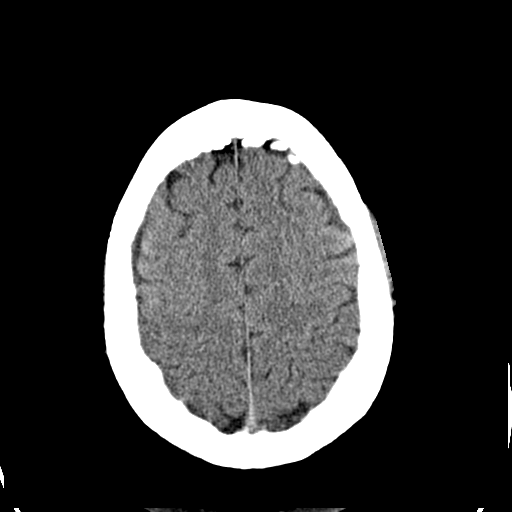
[im 23/30  bone]
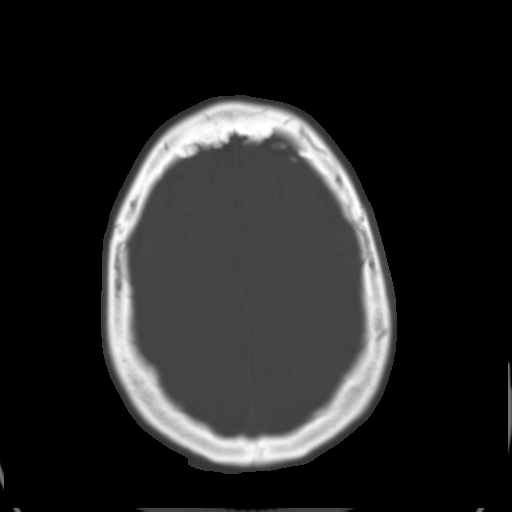
[im 25/30  brain]
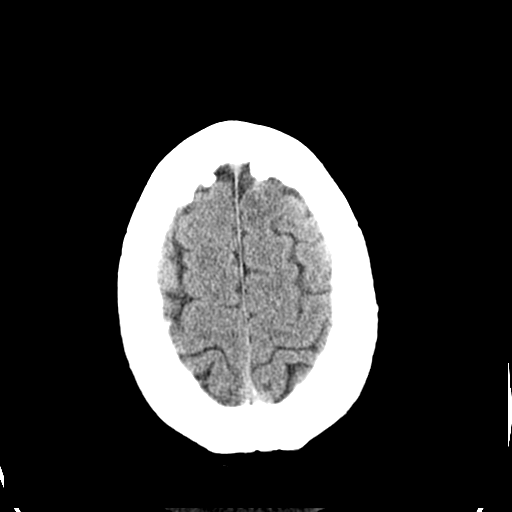
[im 27/30  brain]
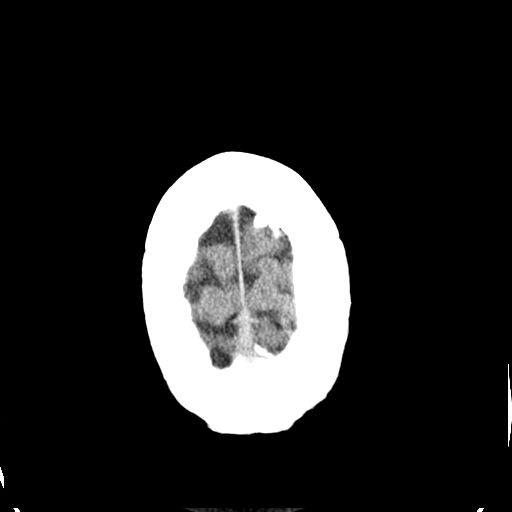
[im 29/30  brain]
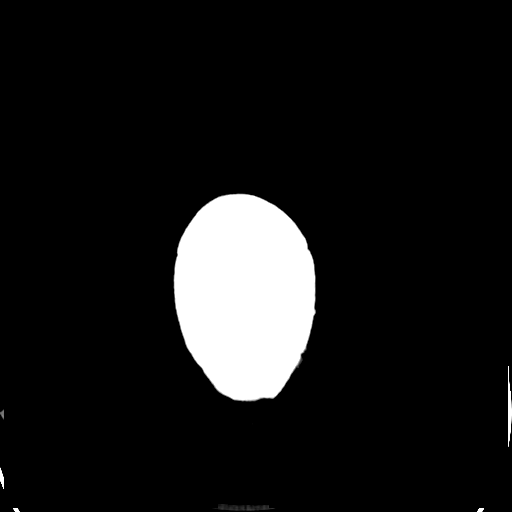

[16 of 30 positions shown; findings below may reference images not displayed]

FINDINGS: No acute intracranial abnormalities are identified,
including mass lesion or mass effect, hydrocephalus, extra-axial
fluid collection, midline shift, hemorrhage, or acute infarction.

The visualized bony calvarium is unremarkable.
IMPRESSION: No intracranial abnormalities.

## 2011-03-26 IMAGING — CR DG KNEE COMPLETE 4+V*R*
4 series · 4 of 4 positions shown · non-contrast
Comparison: 07/10/2000 right knee x-ray report.

CLINICAL DATA: Fall with right knee pain.

RIGHT KNEE - COMPLETE 4+ VIEW

[view not recorded (1 of 4)]
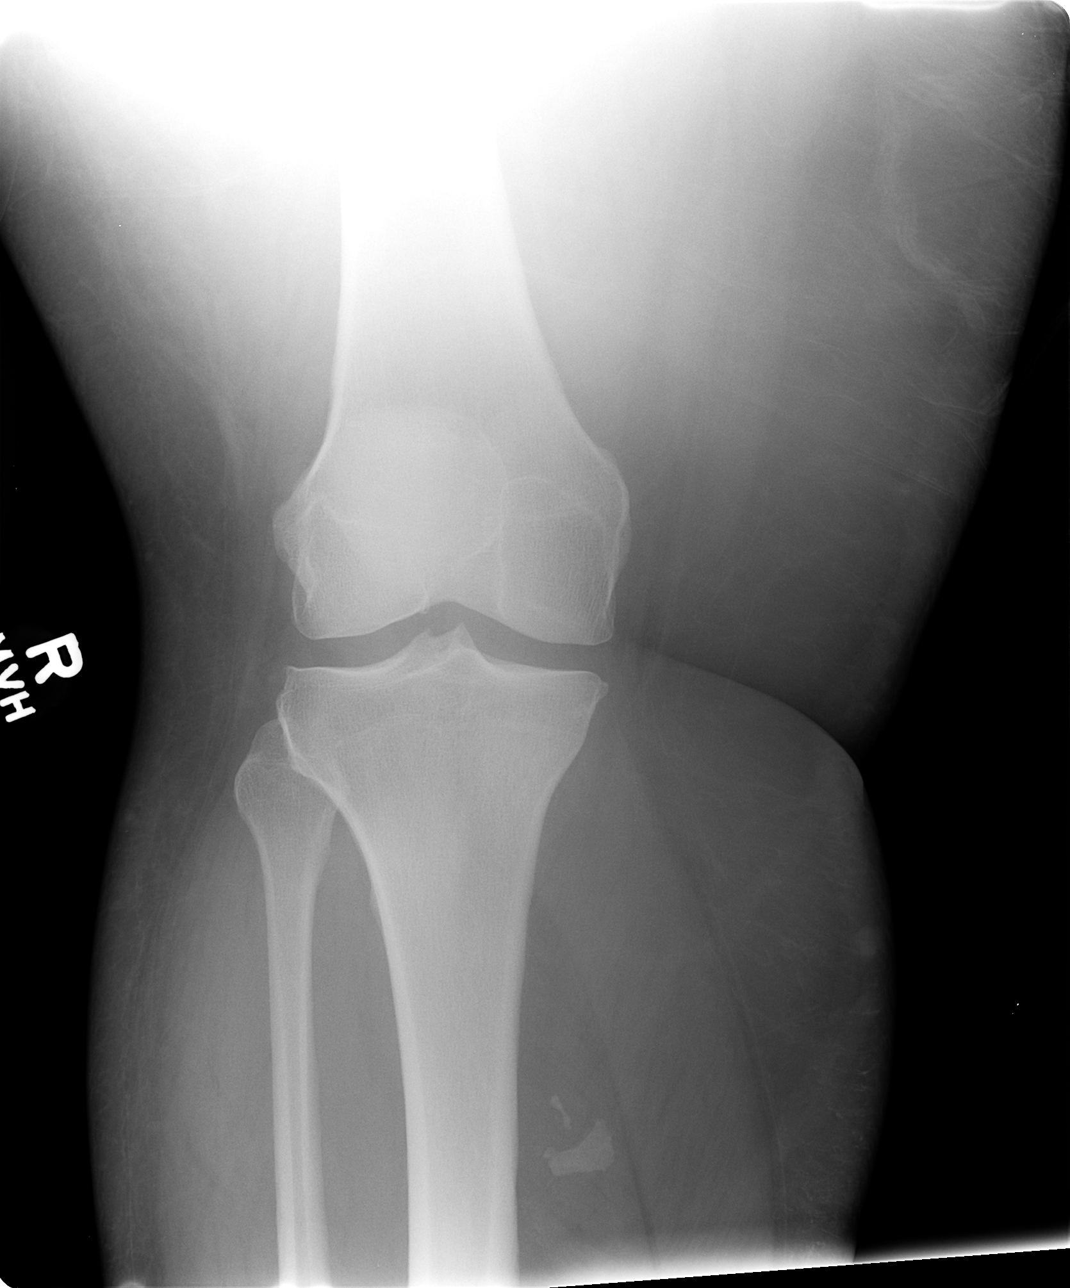

[view not recorded (2 of 4)]
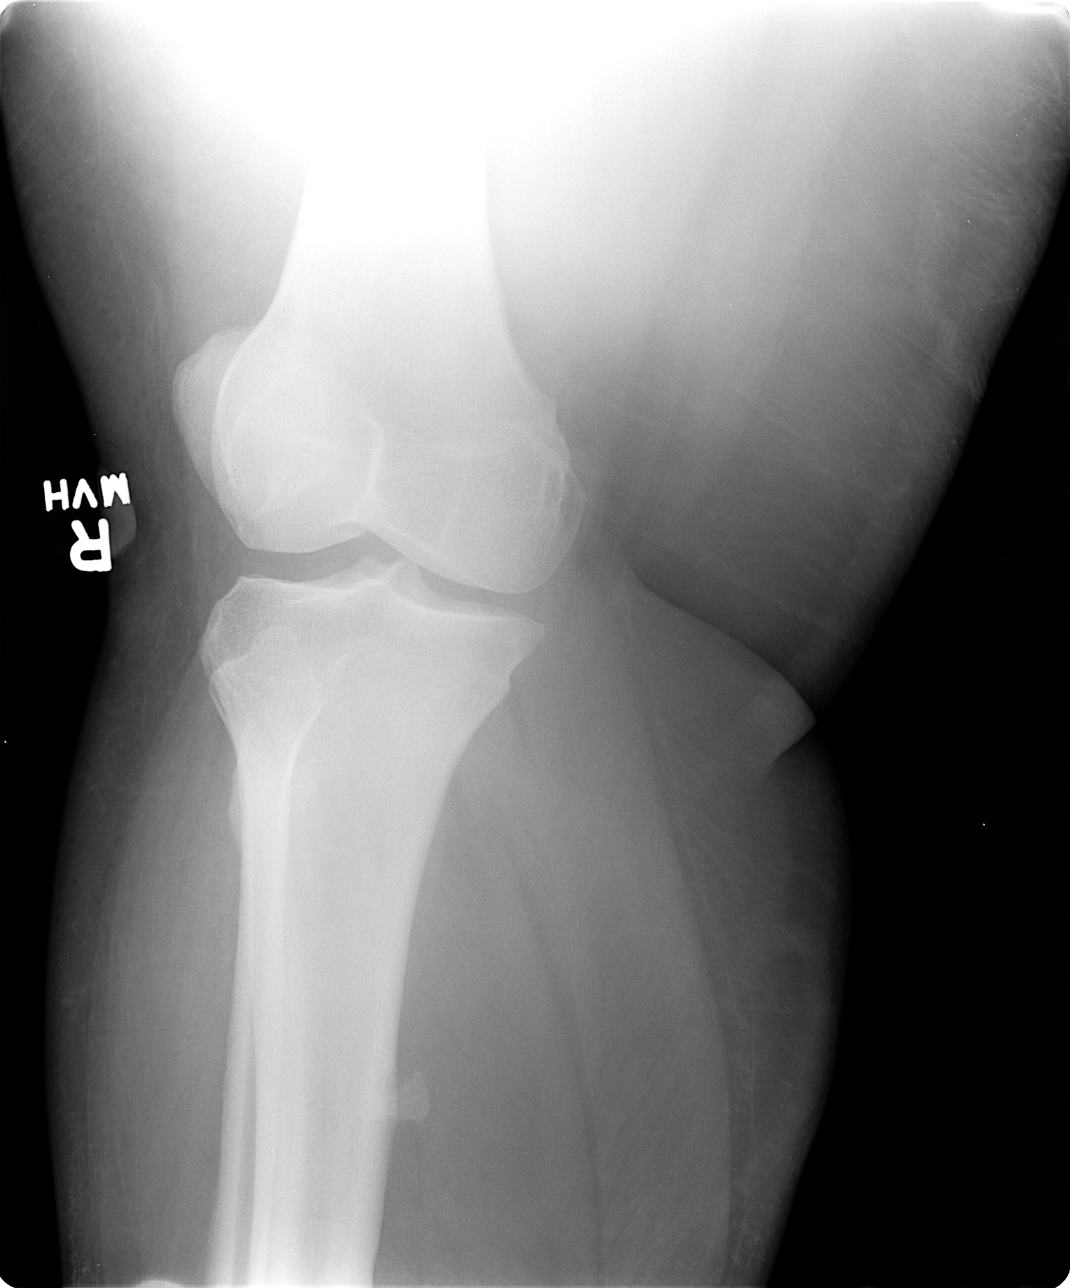

[view not recorded (3 of 4)]
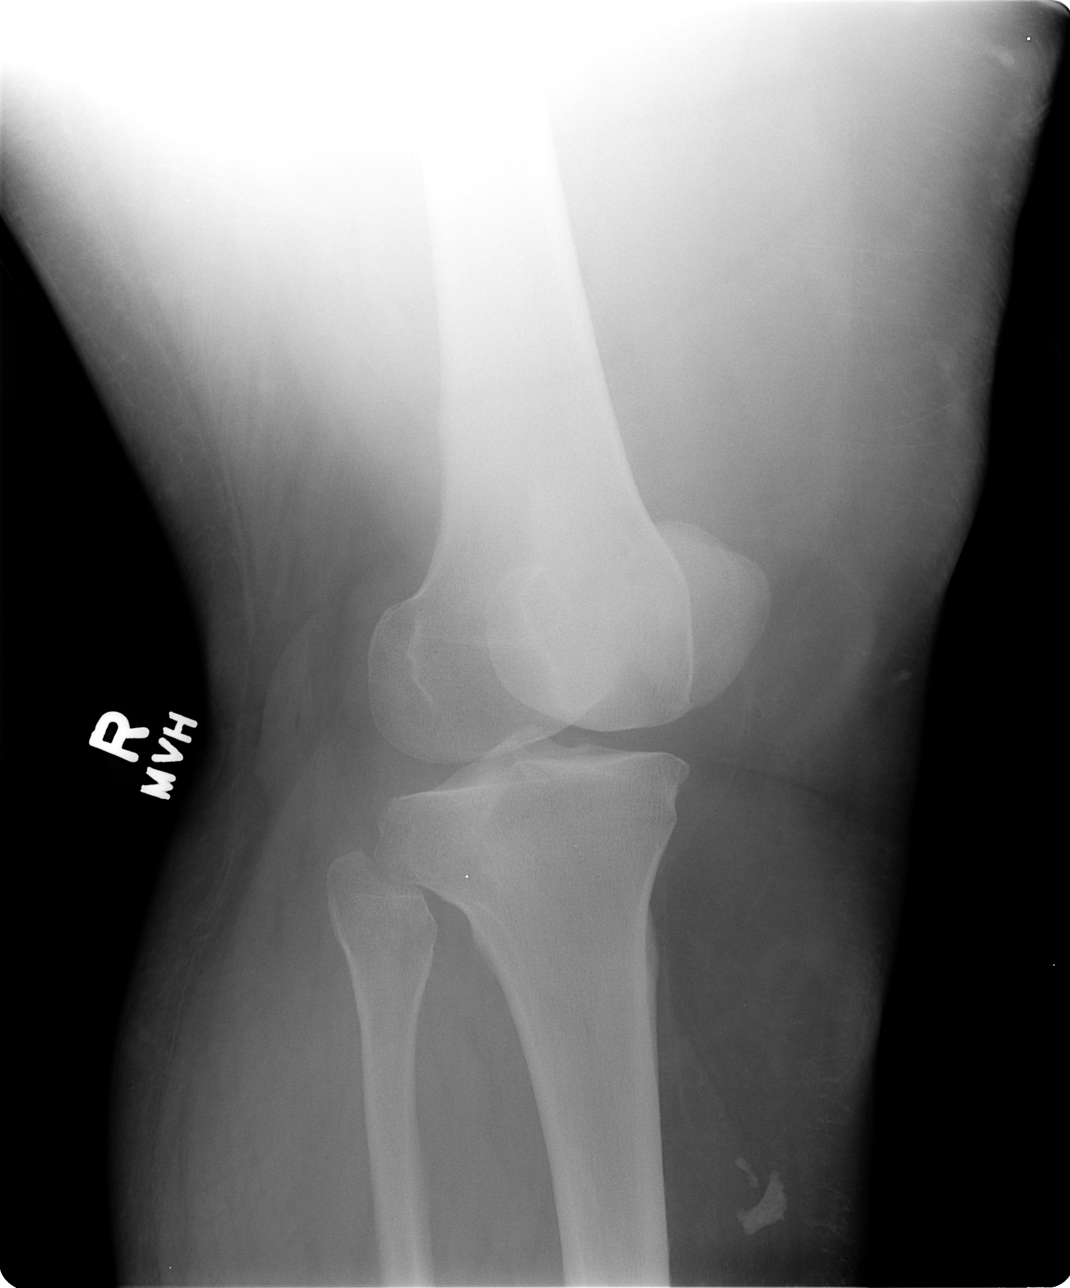

[view not recorded (4 of 4)]
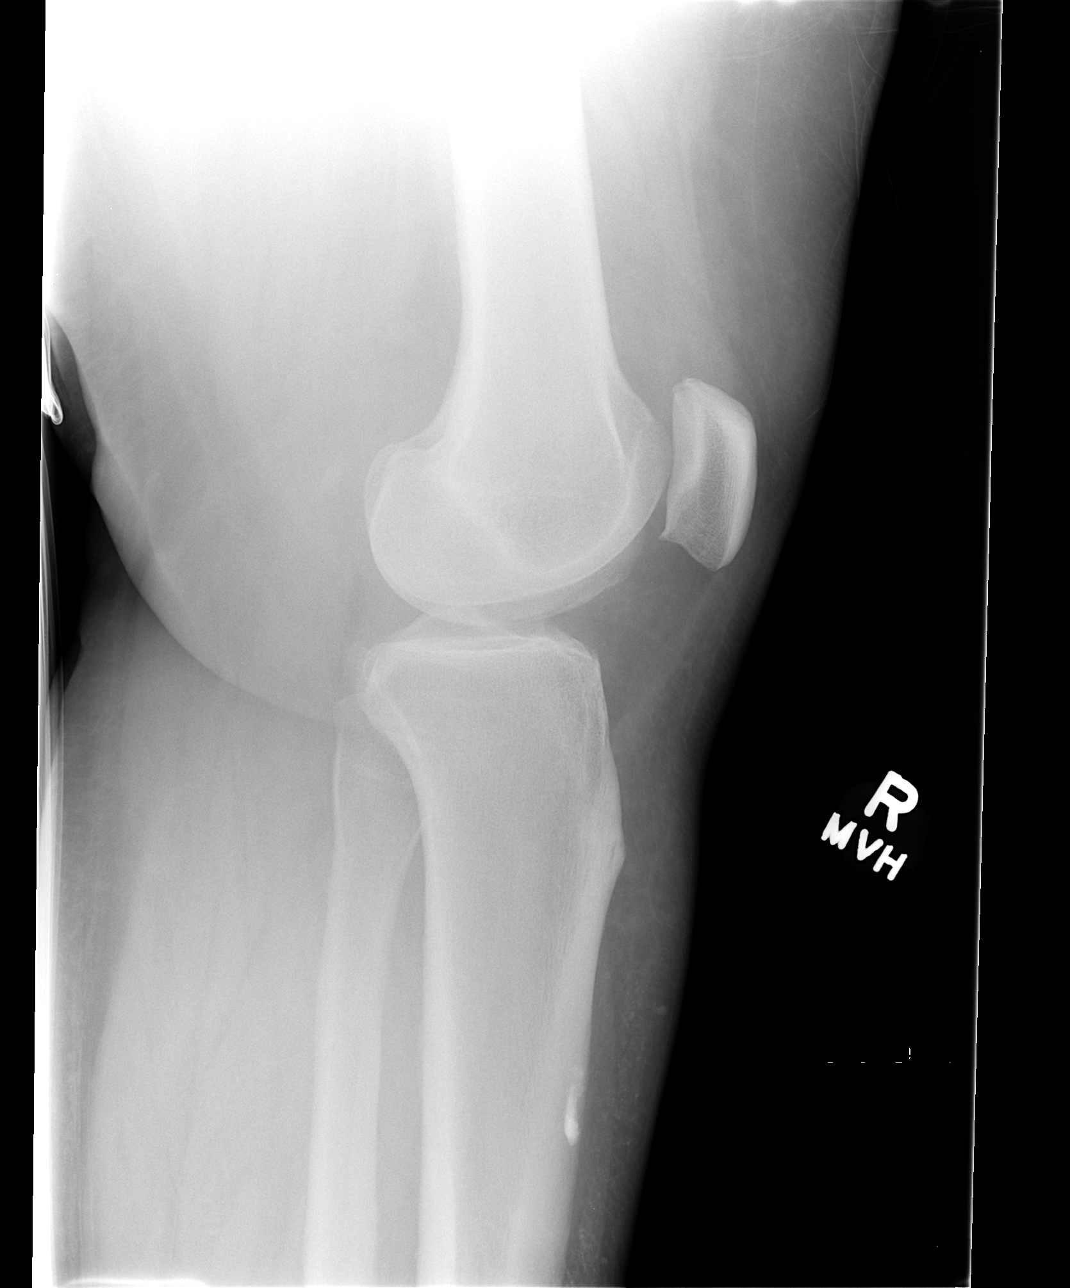

[4 of 4 positions shown; findings below may reference images not displayed]

FINDINGS: Very mild tricompartmental degenerative changes are
identified.
There is no evidence of fracture, subluxation, or dislocation.
No definite knee effusion is present.
Radiodensity medial to the proximal tibia was previously described
likely representing soft tissue calcification or foreign body.
IMPRESSION: No evidence of acute abnormality.

Very mild tricompartmental degenerative changes.

## 2011-04-08 ENCOUNTER — Ambulatory Visit: Payer: Medicaid Other | Admitting: Family Medicine

## 2011-04-16 ENCOUNTER — Emergency Department (HOSPITAL_COMMUNITY)
Admission: EM | Admit: 2011-04-16 | Discharge: 2011-04-16 | Disposition: A | Discharge status: Home / Self Care | Payer: Medicaid Other | Attending: Emergency Medicine | Admitting: Emergency Medicine

## 2011-04-16 DIAGNOSIS — N764 Abscess of vulva: Secondary | ICD-10-CM | POA: Insufficient documentation

## 2011-04-16 DIAGNOSIS — Z794 Long term (current) use of insulin: Secondary | ICD-10-CM | POA: Insufficient documentation

## 2011-04-16 DIAGNOSIS — E119 Type 2 diabetes mellitus without complications: Secondary | ICD-10-CM | POA: Insufficient documentation

## 2011-04-16 LAB — POCT I-STAT, CHEM 8
Calcium, Ion: 1.11 mmol/L — ABNORMAL LOW (ref 1.12–1.32)
Creatinine, Ser: 0.6 mg/dL (ref 0.4–1.2)
Glucose, Bld: 440 mg/dL — ABNORMAL HIGH (ref 70–99)
HCT: 42 % (ref 36.0–46.0)
Hemoglobin: 14.3 g/dL (ref 12.0–15.0)
TCO2: 27 mmol/L (ref 0–100)

## 2011-04-16 LAB — GLUCOSE, CAPILLARY: Glucose-Capillary: 470 mg/dL — ABNORMAL HIGH (ref 70–99)

## 2011-04-20 ENCOUNTER — Ambulatory Visit: Payer: Medicaid Other | Admitting: Family Medicine

## 2011-04-22 ENCOUNTER — Other Ambulatory Visit: Payer: Self-pay | Admitting: Family Medicine

## 2011-04-22 NOTE — Telephone Encounter (Signed)
Refill request

## 2011-04-23 NOTE — H&P (Signed)
NAMEJURNEY, [name]                  ACCOUNT NO.:  [id number]   MEDICAL RECORD NO.:  [id number]          PATIENT TYPE:  IPS   LOCATION:  0507                          FACILITY:  [facility]   PHYSICIAN:  Jeanice Lim, M.D. DATE OF BIRTH:  1976/06/16   DATE OF ADMISSION:  12/22/2004  DATE OF DISCHARGE:                         PSYCHIATRIC ADMISSION ASSESSMENT   IDENTIFYING INFORMATION:  A 35 year old white female voluntarily admitted on  December 22, 2004.   HISTORY OF PRESENT ILLNESS:  The patient presents with a history of  intentional overdose, taking 18 Risperdal tablets last night at her home,  stating that she is just ready to give up.  She is tired of her home  situation.  Her roommate is lazy.  The patient herself is not working as  many hours as she wants to.  She is having difficulties financially.  She  denies any psychotic symptoms and her sleep and appetite has been  satisfactory.   PAST PSYCHIATRIC HISTORY:  First admission to [facility], was  hospitalized  at [facility] 2 or 3 years ago.  Sees Dr. Mila Homer as an outpatient  for major depression and a history of borderline personality.   SOCIAL HISTORY:  She is a 35 year old separated white female, separated for  2 years, has no children.  She was living with a roommate.  She works in a  [employer] manager and denies any legal problems.   FAMILY HISTORY:  None. .   ALCOHOL DRUG HISTORY:  Nonsmoker, denies any alcohol or drug use.   PAST MEDICAL HISTORY:  Primary care Mathhew Buysse is Dr. Renae Gloss in [place].  Medical problems are non-insulin-dependent diabetes mellitus, GERD,  hypertension and allergies.   MEDICATIONS:  Allegra 180 mg daily, Glimepiride 2 mg in the morning,  fluoxetine 40, Risperdal at bedtime, Prevacid 30 mg daily, lisinopril 20 mg  daily, Wellbutrin XL 150 mg daily, has been on that for 1 month, Nabumetone  500 mg daily.   DRUG ALLERGIES:  No known allergies.   PHYSICAL EXAMINATION:   The patient was assessed at [facility].  This is an  obese young female in no acute distress.  Temperature is 97, 113 heart rate,  20 respirations, blood pressure 130/74, 345 pounds, 5 feet 2 inches tall.  Salicylate level less than 4.  Chest x-ray showed no acute disease.  The  patient is uncertain why they did a chest x-ray.  Acetaminophen level less  than 10.  Hemoglobin 11.9, hematocrit 35.4.  PT is 13, albumin 3.1.  TSH is  1.681.   MENTAL STATUS EXAM:  Alert, morbidly obese female, appears older than her  years.  Speech is clear.  The patient is determined to get better.  Affect  is somewhat flat.  Thought processes are coherent, no evidence of psychosis,  cognitive function intact.  Memory is good, judgment is fair, insight is  limited.   ADMISSION DIAGNOSES:   AXIS I:  Mood disorder.   AXIS II:  Borderline personality disorder per patient.   AXIS III:  1.  Morbidly obese.  2.  Non-insulin-dependent diabetes  mellitus.  3.  Hypertension.  4.  Allergies.   AXIS IV:  Other psychosocial problems, medical problems, housing problems.   AXIS V:  Current is 35, this past year 29.   PLAN:  Admission for intentional overdose.  Contract for safety.  We will  resume medications.  We will order only a small amount of Risperdal at  bedtime.  We will have bariatric bed for patient.  Will consider a family  session with her mother.  The patient is to follow up with Dr. Mila Homer.   TENTATIVE LENGTH OF CARE:  4-6 days.      JO/MEDQ  D:  12/24/2004  T:  12/24/2004  Job:  [id number]

## 2011-04-23 NOTE — Discharge Summary (Signed)
[name], [name]                  ACCOUNT NO.:  [id number]   MEDICAL RECORD NO.:  [id number]          PATIENT TYPE:  INP   LOCATION:  5739                         FACILITY:  [facility]   PHYSICIAN:  Madaline Savage, MD        DATE OF BIRTH:  1976/04/11   DATE OF ADMISSION:  12/24/2006  DATE OF DISCHARGE:  12/25/2006                               DISCHARGE SUMMARY   DISCHARGE DIAGNOSES:  1. Community acquired pneumonia which is a right lower lobe pneumonia.  2. A productive cough.  3. Diabetes mellitus.  4. History of obstructive sleep apnea.   PROCEDURES DONE IN THE HOSPITAL:  She had a CT chest for PE protocol  done on December 24, 2006 which showed no definite evidence of pulmonary  embolism.  left lower lobe pneumonia, and atelectasis or interstitial  infection bilaterally.   HISTORY OF PRESENT ILLNESS:  [name] is a 35 year old female with a  history of diabetes mellitus, who was presented to the ER with  complaints of breathing difficulty.  She was evaluated in the emergency  room and apparently had some mild hypoxia on admission.  She had  productive cough and a D-dimer were done which was elevated and so a CAT  scan was ordered and she was admitted for observation.   PROBLEM LIST:  1. Left lower lobe pneumonia.  She had a CT scan which showed a left      lower lobe pneumonia.  She is started on Rocephin and Zithromax.      She will be discharged home on Avelox.  She will continue her      antibiotics for a total of 10 days.  She will also be put on      Mucinex and she has a nebulizer at home but she will use on an as      needed basis.  2. Elevated D-dimer.  Her D-dimer of 0.51, a CT scan per PE protocol      was done which was negative for PE.  Her D-dimer is elevated most      likely secondary to her infection.  3. Diabetes mellitus.  Will continue  her on Lantus as before.   DISPOSITION:  She is now being discharged home in a stable condition.  She was not hypoxic during  the hospital stay since admission.  Her  oxygen saturations range from 94-97% on room air.  She was afebrile  overnight and she feels her breathing has improved and is back to her  baseline.  She will be discharged in a stable condition back home.   FOLLOWUP:  She will follow up with her primary care doctor, Dr. Renae Gloss  in 1 week.     Madaline Savage, MD  Electronically Signed    PKN/MEDQ  D:  12/25/2006  T:  12/25/2006  Job:  [id number]   cc:   Merlene Laughter. Renae Gloss, M.D.

## 2011-04-23 NOTE — Op Note (Signed)
NAMESTEFANA, [name]                  ACCOUNT NO.:  [id number]   MEDICAL RECORD NO.:  [id number]          PATIENT TYPE:  INP   LOCATION:  9127                          FACILITY:  [facility]   PHYSICIAN:  Malachi Pro. Ambrose Mantle, M.D. DATE OF BIRTH:  1976-02-01   DATE OF PROCEDURE:  04/05/2006  DATE OF DISCHARGE:                                 OPERATIVE REPORT   PREOPERATIVE DIAGNOSIS:  Intrauterine pregnancy at 35 weeks 2 days,  preeclampsia, type 2 diabetes, massive obesity, weight 390 pounds, fetal  stress manifested by nonreactive nonstress test, repetitive decelerations of  the fetal heart rate and unobtainable biophysical profile.   POSTOP DIAGNOSIS:  Intrauterine pregnancy at 35 weeks 2 days, preeclampsia,  type 2 diabetes, massive obesity, weight 390 pounds, fetal stress manifested  by nonreactive nonstress test, repetitive decelerations of the fetal heart  rate and unobtainable biophysical profile, small placenta with infarcts.   OPERATION:  Low transverse cervical C-section.   OPERATOR:  [place].   ASSISTANCE:  Meisinger and Richardson.   CONSULTANT:  Dr. Gavin Potters.   Spinal and epidural anesthesia.   The patient was brought to the operating room and given a spinal and  epidural anesthetic by Dr. Harvest Forest.  She was placed supine on the operating  table in frog-leg position.  The urethra was prepped and a Foley catheter  was inserted to straight drain.  The abdomen was then prepped with Betadine  solution and draped as sterile field and I prepared her for a paramedian  incision to the left of the midline above and slightly below the umbilicus.  After draping as a sterile field, I checked anesthesia, it was good. Made  the incision as described.  There was a long ways down to the fascia.  We  did expose the fascia, incised it vertically and then entered the peritoneal  cavity.  We used a self-retaining flexible retractor to aid with exposure  and then with the aid of additional metal  retractors, I made an incision a  little bit above where we normally do in the lower uterine segment, pushed  into the amniotic sac with my finger and then was unable to visualize well  enough to cut with the bandage scissors, so I enlarged the incision by  pulling superiorly and inferiorly on the incision.  I was able to identify  the head, delivered the head through the incisional opening, suctioned the  mouth and nose with the bulb then clamped the cord and the infant was given  to the neonatologist who was in attendance.  Apgars were reported as 5 and 9  so the cord segment that was preserved for pH was discarded.  Routine cord  blood studies had been obtained.  The placenta was removed.  The inside the  uterus was inspected and found to be free of debris.  The cervix was dilated  with a ring forceps.  I did inspect both tubes and ovaries.  Both tubes were  normal.  The ovaries were both very had a glistening white surface  suggestive of polycystic ovarian syndrome.  I palpated  one small subserosal  fibroid but nothing of major importance on the uterus.  I closed the uterine  incision with two running sutures of 0 Vicryl locking the first layer,  nonlocking on the second layer.  Hemostasis was achieved with one figure-of-  eight suture of 0 Vicryl.  Liberal irrigation confirmed hemostasis and then  the abdominal wall was closed with interrupted Smead-Jones sutures through  the subcu tissue, the rectus muscle, the fascia, the peritoneum in a far far  near near type fashion including only the fascia on the near near.  This  successfully closed the abdominal cavity.  The subcu tissue was then closed  with two running sutures of 3-0 Vicryl.  The skin was closed with automatic  staples.  The patient seemed to tolerate the procedure well.  Blood loss was  about 1000 mL.  Sponge and needle counts were correct and she was returned  to recovery in satisfactory condition.      Malachi Pro.  Ambrose Mantle, M.D.  Electronically Signed     TFH/MEDQ  D:  04/05/2006  T:  04/06/2006  Job:  [id number]   cc:   Conni Elliot, M.D.

## 2011-04-23 NOTE — H&P (Signed)
NAMEARNICE, Hall                  ACCOUNT NO.:  [id number]   MEDICAL RECORD NO.:  [id number]          PATIENT TYPE:  INP   LOCATION:  5739                         FACILITY:  [facility]   PHYSICIAN:  Gardiner Barefoot, MD    DATE OF BIRTH:  Sep 30, 1976   DATE OF ADMISSION:  12/24/2006  DATE OF DISCHARGE:                              HISTORY & PHYSICAL   PRIMARY CARE PHYSICIAN:  Dr. Renae Gloss.   CHIEF COMPLAINT:  Productive cough.   HISTORY OF PRESENT ILLNESS:  [name] is a 35 year old female with a  history of diabetes, who presented to the pediatric emergency room with  her daughter to be evaluated and was noted to be short of breath with  significant coughing.  The patient was then admitted to the emergency  room and on workup, noted to have some hypoxia with some O2 sat of 94%  on room air, productive cough, and shortness of breath.  The patient was  essentially evaluated then with a CAT scan and a chest x-ray which  reportedly had a positive consolidative process on the x-ray.  The  patient otherwise does not report any recent fever and has had this  productive cough for about 2 weeks with little improvement.  The patient  has a sort of a yellowish-green tinted sputum and no sick contacts.  The  patient was otherwise in her usual state of health prior to this last 2  weeks.   PAST MEDICAL HISTORY:  Diabetes.   MEDICATIONS:  Lantus insulin 10 units q.h.s.  No other diabetes  medications.   ALLERGIES:  NUBAIN.   SOCIAL HISTORY:  The patient denies any smoking, drinking, or alcohol.   FAMILY HISTORY:  A sister with asthma.   REVIEW OF SYSTEMS:  A complete 12-point review of systems was obtained  and was negative other than that as presented in the History of Present  Illness.   PHYSICAL EXAMINATION:  VITAL SIGNS:  Temperature 97, pulse 102, blood  pressure 108/70, O2 saturation 93% on room air, and respiratory rate is  20.  GENERAL:  The patient is awake, alert, and oriented  x3 and appears in no  acute distress.  HEENT:  Anicteric.  Mucous membranes are moist.  CARDIOVASCULAR:  Regular rate and rhythm with no murmurs, rubs, or  gallops.  LUNGS:  Clear to auscultation bilaterally, no crackle appreciated.  ABDOMEN:  Obese, nontender, nondistended, with positive bowel sounds and  no hepatosplenomegaly.  EXTREMITIES:  No clubbing, cyanosis, or edema.   LABORATORY DATA:  Chest x-ray pending.  WBC of 9.4 with 56% neutrophils  and no bandemia.  Hemoglobin is 14.0 and platelets 378.  Sodium 135,  potassium 3.7, chloride 98, bicarb 28, glucose 163, BUN is 8, creatinine  is 0.61.  D-dimer is 0.51.  Urinalysis with trace leukocytes.   ASSESSMENT AND PLAN:  1. Community-acquired pneumonia.  Radiographically, the patient has      pneumonia with continued productive sputum.  However, no evidence      of a true bacterial process with no fever so far, a normal white  count.  However, with the diabetes, certainly the patient is more      at risk of acquiring an infection.  We will therefore treat her for      community-acquired pneumonia with a ceftriaxone and azithromycin,      and if patient continues to do well we can transition her to a p.o.      fluoroquinolone for outpatient treatment.  2. Diabetes.  We will continue with the patient's Lantus insulin at      night.  3. Question pulmonary embolism.  The patient's D-dimer is nearly      within the normal range, and the CT angio has been done and      reported negative.  We will follow up with a radiology report when      dictated.      Gardiner Barefoot, MD  Electronically Signed     RWC/MEDQ  D:  12/24/2006  T:  12/25/2006  Job:  [id number]

## 2011-04-23 NOTE — Group Therapy Note (Signed)
NAME:  [name], [name]                            ACCOUNT NO.:  [id number]   MEDICAL RECORD NO.:  [id number]                   PATIENT TYPE:  OUT   LOCATION:  [facility]                           FACILITY:  [facility]   PHYSICIAN:  Tinnie Gens, MD                     DATE OF BIRTH:  [birth year]   DATE OF SERVICE:  02/07/2004                                    CLINIC NOTE   CHIEF COMPLAINT:  Follow-up for vaginal warts.   HISTORY OF PRESENT ILLNESS:  The patient is a 35 year old gravida 0 para 0  who was referred here for vaginal warts around her rectum.  She apparently  was seen in the ER with them bleeding and irritated.  They have been there  since approximately September or October.   PAST MEDICAL HISTORY:  Significant for asthma, hypertension, diabetes, and  depression.   SURGICAL HISTORY:  She had an arthroscopy in 2002, carpal tunnel release in  1998, and an open knee repair in 2003.   MEDICATIONS:  She is on Amaryl, Allegra D, Singulair, Prevacid, Prozac, and  Prinivil.   ALLERGIES:  NUBAIN.   OBSTETRICAL HISTORY:  G0.   GYNECOLOGICAL HISTORY:  Menarche at age 6.  Very irregular menses,  approximately two per year.  LMP is January 07, 2004.  When menses come they  are heavy and painful and last approximately 5-14 days.  She is currently on  no contraception.  She does have a history of abnormal Pap in June 2003 but  she had no follow-up.   PREVENTIVE MEDICATIONS:  All of her vaccinations are up to date.   FAMILY HISTORY:  Significant for hypertension in her grandmother, COPD in  her grandfather.   SOCIAL HISTORY:  She lives with her mom, sister, and nephew.  She does not  smoke, drink alcohol, or do any drugs.  She is occasionally sexually active;  however, she does report some condom use.   REVIEW OF SYSTEMS:  A 14-point review of systems is reviewed and is  significant for fatigue, headaches, difficulty with vision, occasional  shortness of breath with chest pain, and  nausea and vomiting; otherwise it  is negative.  She has [facility] as her primary doctor and she will follow  them up for most of the things reported in her review of systems.   PHYSICAL EXAMINATION TODAY:  VITAL SIGNS:  Her weight is 338.7, blood  pressure is 134/95, pulse 91.  GENERAL:  She is an obese female with a pickwickian body type.  She is  hirsute.  ABDOMEN:  Morbidly obese but nontender.  GENITOURINARY:  She has extensive genital warts located perirectally with  kissing lesions.  The vagina is rugated.  The cervix is nulliparous and  without lesion.  Bimanual exam is significantly limited by body habitus.   IMPRESSION:  1. Gynecologic examination with Pap smear.  2. History of abnormal Pap.  3. Probable polycystic ovarian syndrome given diabetes, hypertension, and     oligomenorrhea.  4. Significant fatigue.  5. Genital warts.   PLAN:  1. Pap smear with HPV typing, GC, and chlamydia testing today.  2. Genital warts treated with 80% TCA.  Will follow up in 1 weeks.  3. Provera challenge, with withdrawal bleeding begin OCs every month for     uterine lining protection.  4. I will follow up her PCOS every 3 months.                                               Tinnie Gens, MD    TP/MEDQ  D:  02/07/2004  T:  02/07/2004  Job:  [id number]   cc:   Dala Dock on [address]

## 2011-04-26 ENCOUNTER — Other Ambulatory Visit: Payer: Self-pay | Admitting: Family Medicine

## 2011-04-26 NOTE — Telephone Encounter (Signed)
Refill request

## 2011-05-07 ENCOUNTER — Ambulatory Visit: Payer: Medicaid Other | Admitting: Family Medicine

## 2011-05-12 ENCOUNTER — Ambulatory Visit: Payer: Medicaid Other | Admitting: Family Medicine

## 2011-05-25 ENCOUNTER — Observation Stay (HOSPITAL_COMMUNITY)
Admission: AD | Admit: 2011-05-25 | Discharge: 2011-05-26 | Disposition: A | Discharge status: Home / Self Care | Payer: Medicaid Other | Source: Other Acute Inpatient Hospital | Attending: Family Medicine | Admitting: Family Medicine

## 2011-05-25 ENCOUNTER — Emergency Department (HOSPITAL_COMMUNITY)
Admission: EM | Admit: 2011-05-25 | Discharge: 2011-05-25 | Disposition: A | Discharge status: Other Acute Inpatient Hospital | Payer: Medicaid Other | Source: Home / Self Care | Attending: Emergency Medicine | Admitting: Emergency Medicine

## 2011-05-25 ENCOUNTER — Other Ambulatory Visit: Payer: Self-pay | Admitting: Family Medicine

## 2011-05-25 DIAGNOSIS — E662 Morbid (severe) obesity with alveolar hypoventilation: Secondary | ICD-10-CM | POA: Insufficient documentation

## 2011-05-25 DIAGNOSIS — K219 Gastro-esophageal reflux disease without esophagitis: Secondary | ICD-10-CM | POA: Insufficient documentation

## 2011-05-25 DIAGNOSIS — L0291 Cutaneous abscess, unspecified: Secondary | ICD-10-CM

## 2011-05-25 DIAGNOSIS — I10 Essential (primary) hypertension: Secondary | ICD-10-CM

## 2011-05-25 DIAGNOSIS — R109 Unspecified abdominal pain: Secondary | ICD-10-CM | POA: Insufficient documentation

## 2011-05-25 DIAGNOSIS — G4733 Obstructive sleep apnea (adult) (pediatric): Secondary | ICD-10-CM | POA: Insufficient documentation

## 2011-05-25 DIAGNOSIS — IMO0001 Reserved for inherently not codable concepts without codable children: Secondary | ICD-10-CM | POA: Insufficient documentation

## 2011-05-25 DIAGNOSIS — L0390 Cellulitis, unspecified: Secondary | ICD-10-CM

## 2011-05-25 DIAGNOSIS — T7840XA Allergy, unspecified, initial encounter: Secondary | ICD-10-CM

## 2011-05-25 DIAGNOSIS — M793 Panniculitis, unspecified: Principal | ICD-10-CM | POA: Insufficient documentation

## 2011-05-25 DIAGNOSIS — J309 Allergic rhinitis, unspecified: Secondary | ICD-10-CM | POA: Insufficient documentation

## 2011-05-25 LAB — DIFFERENTIAL
Basophils Absolute: 0 10*3/uL (ref 0.0–0.1)
Eosinophils Relative: 2 % (ref 0–5)
Lymphocytes Relative: 43 % (ref 12–46)
Lymphs Abs: 3.2 10*3/uL (ref 0.7–4.0)
Monocytes Absolute: 0.7 10*3/uL (ref 0.1–1.0)
Neutro Abs: 3.4 10*3/uL (ref 1.7–7.7)

## 2011-05-25 LAB — POCT I-STAT, CHEM 8
Calcium, Ion: 0.94 mmol/L — ABNORMAL LOW (ref 1.12–1.32)
Chloride: 99 mEq/L (ref 96–112)
Creatinine, Ser: 0.4 mg/dL — ABNORMAL LOW (ref 0.50–1.10)
Glucose, Bld: 429 mg/dL — ABNORMAL HIGH (ref 70–99)
HCT: 41 % (ref 36.0–46.0)
Potassium: 5.9 mEq/L — ABNORMAL HIGH (ref 3.5–5.1)

## 2011-05-25 LAB — HEMOGLOBIN A1C
Hgb A1c MFr Bld: 14.6 % — ABNORMAL HIGH (ref <5.7)
Mean Plasma Glucose: 372 mg/dL — ABNORMAL HIGH (ref <117)

## 2011-05-25 LAB — GLUCOSE, CAPILLARY
Glucose-Capillary: 400 mg/dL — ABNORMAL HIGH (ref 70–99)
Glucose-Capillary: 204 mg/dL — ABNORMAL HIGH (ref 70–99)

## 2011-05-25 LAB — CBC
HCT: 38.4 % (ref 36.0–46.0)
Hemoglobin: 13.2 g/dL (ref 12.0–15.0)
MCHC: 34.4 g/dL (ref 30.0–36.0)
MCV: 87.9 fL (ref 78.0–100.0)
RDW: 12.4 % (ref 11.5–15.5)

## 2011-05-25 NOTE — Telephone Encounter (Signed)
Refill request

## 2011-05-26 LAB — GLUCOSE, CAPILLARY: Glucose-Capillary: 349 mg/dL — ABNORMAL HIGH (ref 70–99)

## 2011-05-28 ENCOUNTER — Encounter: Payer: Self-pay | Admitting: Family Medicine

## 2011-05-28 ENCOUNTER — Emergency Department (HOSPITAL_COMMUNITY): Payer: Medicaid Other

## 2011-05-28 ENCOUNTER — Observation Stay (HOSPITAL_COMMUNITY)
Admission: EM | Admit: 2011-05-28 | Discharge: 2011-05-29 | Disposition: A | Discharge status: Home / Self Care | Payer: Medicaid Other | Attending: Emergency Medicine | Admitting: Emergency Medicine

## 2011-05-28 DIAGNOSIS — G4733 Obstructive sleep apnea (adult) (pediatric): Secondary | ICD-10-CM

## 2011-05-28 DIAGNOSIS — M793 Panniculitis, unspecified: Principal | ICD-10-CM | POA: Insufficient documentation

## 2011-05-28 DIAGNOSIS — R0789 Other chest pain: Secondary | ICD-10-CM | POA: Insufficient documentation

## 2011-05-28 DIAGNOSIS — Z91199 Patient's noncompliance with other medical treatment and regimen due to unspecified reason: Secondary | ICD-10-CM | POA: Insufficient documentation

## 2011-05-28 DIAGNOSIS — J4489 Other specified chronic obstructive pulmonary disease: Secondary | ICD-10-CM | POA: Insufficient documentation

## 2011-05-28 DIAGNOSIS — Z9119 Patient's noncompliance with other medical treatment and regimen: Secondary | ICD-10-CM | POA: Insufficient documentation

## 2011-05-28 DIAGNOSIS — J449 Chronic obstructive pulmonary disease, unspecified: Secondary | ICD-10-CM | POA: Insufficient documentation

## 2011-05-28 DIAGNOSIS — IMO0001 Reserved for inherently not codable concepts without codable children: Secondary | ICD-10-CM | POA: Insufficient documentation

## 2011-05-28 DIAGNOSIS — E119 Type 2 diabetes mellitus without complications: Secondary | ICD-10-CM

## 2011-05-28 DIAGNOSIS — E662 Morbid (severe) obesity with alveolar hypoventilation: Secondary | ICD-10-CM | POA: Insufficient documentation

## 2011-05-28 DIAGNOSIS — L0390 Cellulitis, unspecified: Secondary | ICD-10-CM

## 2011-05-28 DIAGNOSIS — L0291 Cutaneous abscess, unspecified: Secondary | ICD-10-CM

## 2011-05-28 DIAGNOSIS — I10 Essential (primary) hypertension: Secondary | ICD-10-CM | POA: Insufficient documentation

## 2011-05-28 LAB — COMPREHENSIVE METABOLIC PANEL
ALT: 21 U/L (ref 0–35)
Albumin: 2.6 g/dL — ABNORMAL LOW (ref 3.5–5.2)
Alkaline Phosphatase: 115 U/L (ref 39–117)
Chloride: 97 mEq/L (ref 96–112)
Potassium: 4 mEq/L (ref 3.5–5.1)
Sodium: 135 mEq/L (ref 135–145)
Total Bilirubin: 0.1 mg/dL — ABNORMAL LOW (ref 0.3–1.2)
Total Protein: 7.3 g/dL (ref 6.0–8.3)

## 2011-05-28 LAB — CBC
HCT: 38.5 % (ref 36.0–46.0)
Hemoglobin: 13.1 g/dL (ref 12.0–15.0)
MCH: 30 pg (ref 26.0–34.0)
MCHC: 34 g/dL (ref 30.0–36.0)
MCV: 88.3 fL (ref 78.0–100.0)
Platelets: 336 K/uL (ref 150–400)
RBC: 4.36 MIL/uL (ref 3.87–5.11)
RDW: 12.5 % (ref 11.5–15.5)
WBC: 8.7 10*3/uL (ref 4.0–10.5)

## 2011-05-28 LAB — CARDIAC PANEL(CRET KIN+CKTOT+MB+TROPI)
CK, MB: 1 ng/mL (ref 0.3–4.0)
Relative Index: INVALID (ref 0.0–2.5)
Total CK: 39 U/L (ref 7–177)
Troponin I: <0.3 ng/mL (ref <0.30)

## 2011-05-28 LAB — DIFFERENTIAL
Basophils Absolute: 0 K/uL (ref 0.0–0.1)
Basophils Relative: 0 % (ref 0–1)
Eosinophils Absolute: 0.2 10*3/uL (ref 0.0–0.7)
Eosinophils Relative: 2 % (ref 0–5)
Lymphocytes Relative: 36 % (ref 12–46)
Lymphs Abs: 3.2 10*3/uL (ref 0.7–4.0)
Monocytes Absolute: 0.6 K/uL (ref 0.1–1.0)
Monocytes Relative: 6 % (ref 3–12)
Neutro Abs: 4.9 K/uL (ref 1.7–7.7)
Neutrophils Relative %: 56 % (ref 43–77)

## 2011-05-28 LAB — COMPREHENSIVE METABOLIC PANEL WITH GFR
AST: 16 U/L (ref 0–37)
BUN: 10 mg/dL (ref 6–23)
CO2: 29 meq/L (ref 19–32)
Calcium: 9.3 mg/dL (ref 8.4–10.5)
Creatinine, Ser: <0.47 mg/dL — ABNORMAL LOW (ref 0.50–1.10)
Glucose, Bld: 332 mg/dL — ABNORMAL HIGH (ref 70–99)

## 2011-05-28 LAB — TROPONIN I: Troponin I: <0.3 ng/mL (ref <0.30)

## 2011-05-28 LAB — GLUCOSE, CAPILLARY: Glucose-Capillary: 314 mg/dL — ABNORMAL HIGH (ref 70–99)

## 2011-05-28 LAB — CK TOTAL AND CKMB (NOT AT ARMC)
CK, MB: 1.1 ng/mL (ref 0.3–4.0)
Relative Index: INVALID (ref 0.0–2.5)
Total CK: 43 U/L (ref 7–177)

## 2011-05-28 LAB — D-DIMER, QUANTITATIVE: D-Dimer, Quant: 0.45 ug{FEU}/mL (ref 0.00–0.48)

## 2011-05-28 NOTE — H&P (Signed)
Family Medicine Teaching [facility] Admission History and Physical  Patient name: [name] Medical record number: [id number] Date of birth: Jul 03, 1976 Age: 35 y.o. Gender: female  Primary Care Provider: Helane Rima, DO  Chief Complaint: Right chest wall pain and worsening cellulitis History of Present Illness: [name] is a 35 y.o. year old female with DM, HTN, morbid obesity presenting with worsening panniculitis and chest wall pain. Patient recently discharged 2 days ago due to panniculitis. Had improved on oral clindamycin during hospitalization, no development of fevers or leukocytosis or abscess. Also had uncontrolled diabetes with hyperglycemia 300s and A1c >14. Since discharge, patient thinks her redness and tenderness have spread to include more of her left lower abdomen. Has a small breakage in skin under inguinal skin fold, denies trauma. She states that she has been taking oral clindamycin as prescribed. Denies fever, n/v/d, malaise. She has developed a pain in her chest wall yesterday that onset with precipitating cause. Located central-right sternum. Worse with deep breathing, coughing, movement of any kind. Made better by CPAP worn nightly. Denies SOB, fevers, cough. Blood sugars continue to be elevated, fasting was 280 and last pm was 350. She is taking novolog sliding scale as prescribed.   Past Medical History:  Diagnoses  . DIABETES MELLITUS  . MORBID OBESITY  . OBESITY HYPOVENTILATION SYNDROME  . DEPRESSION  . OBSTRUCTIVE SLEEP APNEA  . HYPERTENSION  . ASTHMA  . GERD  . CELLULITIS AND ABSCESS OF NECK  . BACK PAIN  . COSTOCHONDRITIS, LEFT      Past Surgical History: C/S Rt knee arthroscopy 2011  Social History: History   Social History  . Marital Status: Divorced, now engaged.   Social History Main Topics  . Smoking status: DENIES  . Smokeless tobacco:   . Alcohol Use: DENIES  . Drug Use: DENIES     Family  History: Mother-DM2  Allergies: Allergies  Allergen Reactions  . Nubain (Nalbuphine Hcl)      Current Outpatient Prescriptions  Medication Sig Dispense Refill  . ARIPiprazole (ABILIFY) 10 MG tablet Take 10 mg by mouth daily. Per [employer]       . budesonide-formoterol (SYMBICORT) 160-4.5 MCG/ACT inhaler Inhale 2 puffs into the lungs 2 (two) times daily.  1 Inhaler  12  . carvedilol (COREG) 6.25 MG tablet Take 6.25 mg by mouth 2 (two) times daily with meals.        . cetirizine (ZYRTEC) 10 MG tablet Take 10 mg by mouth daily as needed.       .        .      . glimepiride (AMARYL) 4 MG tablet TAKE 1 TABLET BY MOUTH EVERY DAY  30 tablet  PRN  . LANTUS SOLOSTAR 100 UNIT/ML injection INJECT 45 UNITS SUBCUTANEOUSLY EVERY MORNING AND 45 UNITS SUBCUTANEOUSLY EVERY EVENING  30 mL  PRN  . lisinopril (PRINIVIL,ZESTRIL) 10 MG tablet TAKE 1 TABLET BY MOUTH EVERY DAY  30 tablet  PRN  . NEXIUM 40 MG capsule TAKE 1 CAPSULE BY MOUTH EVERY MORNING  30 capsule  PRN  . NOVOLOG FLEXPEN 100 UNIT/ML injection INJECT SUBCUTANEOUSLY AS DIRECTED BY SLIDING SCALE  30 mL  PRN  . sertraline (ZOLOFT) 50 MG tablet Take 50 mg by mouth daily. Per [facility]        . traMADol (ULTRAM) 50 MG tablet Take 50 mg by mouth 2 (two) times daily as needed. For breakthru pain       . VENTOLIN HFA 108 (  90 BASE) MCG/ACT inhaler INHALE 2 PUFFS BY MOUTH EVERY 4 HOURS AS NEEDED FOR SHORTNESS OF BREATH  1 Inhaler  3  . zolpidem (AMBIEN) 5 MG tablet Take 5 mg by mouth at bedtime as needed.         Review Of Systems: Per HPI with the following additions: No skin cuts, dysuria, cough, n/v/d. Otherwise 12 point review of systems was performed and was unremarkable.  Physical Exam: Pulse: 92  Blood Pressure: 129/81 RR: 22   O2: 96 on RA Temp: 98.1  General: alert, cooperative and no distress HEENT: PERRLA and extra ocular movement intact Heart: S1, S2 normal, no murmur, rub or gallop, regular rate and rhythm Lungs: clear to  auscultation, no wheezes or rales and unlabored breathing. Central sternum and costal margin tender to palpation. Pain with deep inspiration. Abdomen: abdomen is soft without significant tenderness, masses, organomegaly or guarding Extremities: extremities normal, atraumatic, no cyanosis or edema Skin: Lower abdomen with erythema and induration bilateral to umbilicus. Tender to palpation. No abscesses palpated. Small fissure at left inguinal border, no erythema or pus expressed.  Neurology: normal without focal findings, mental status, speech normal, alert and oriented x3 and PERLA  Labs and Imaging: Lab Results  Component Value Date/Time   NA 135 05/28/2011  3:48 PM   K 4.0 05/28/2011  3:48 PM   CL 97 05/28/2011  3:48 PM   CO2 29 05/28/2011  3:48 PM   BUN 10 05/28/2011  3:48 PM   CREATININE <0.47* 05/28/2011  3:48 PM   GLUCOSE 332* 05/28/2011  3:48 PM   Lab Results  Component Value Date   WBC 8.7 05/28/2011   HGB 13.1 05/28/2011   HCT 38.5 05/28/2011   MCV 88.3 05/28/2011   PLT 336 05/28/2011   D-dimer 0.45 CEs neg x1  CXR: no acute abnormalities EKG: NSR per EDP   Assessment and Plan: [name] is a 35 y.o. year old female with uncontrolled T2DM presenting with worsening panniculitis and chest wall pain. 1. Panniculitis. Had initially improved with oral clindamycin, but now is failing treatment evidenced by spreading of erythema on physical exam.  No signs of systemic infection currently. Afebrile and no leukocytosis.  No abscesses palpated amenable to drainage. Will change to oral keflex and doxycycline for MRSA and strep coverage. Will monitor am CBC and ink boundaries marked on skin. Wound consult for skin breakage under inguinal fold. Exacerbated by uncontrolled hyperglycemia, will attempt improved diabetic control with SSI and carb modified diet. If exam worsens or febrile, will change to IV abx.  2. Chest wall pain. This is reproducible on exam and pleuritic in nature. EKG and CEs  negative x1. WIll cycle enzymes given risk factors (DM, obesity). EKG in am. Telemetry monitoring. D-dimer negative and stable on room air, no tachycardia. Likely costochondritis. Toradol in ED did not alleviate, will allow oxycodone 5mg  q6 hr prn.  3. DM. Severely uncontrolled with A1c >14. Pt states she takes lantus 45 u BID and sliding scale novolog at home. Will restart lantus 45 BID here and moderate sliding scale insulin. Carb modified diet. Will likely require increased dose of lantus at DC once she restarts her home diet.  4. HTN. Controlled currently. Will continue lisinopril and coreg at home dose.  5. FEN/GI: carb modified diet. NS IVF at 1L then SLIV. F/u am BMET. 6. Prophylaxis: Heparin SQ and protonix daily. 7. Disposition: Pending clinical course. To home tomorrow if improved on oral antibiotics and cardiac rule out.

## 2011-05-28 NOTE — H&P (Signed)
Family Medicine Teaching [facility] Admission History and Physical  R2 Addendum Patient name: [name] Medical record number: [id number]  Date of birth: 1976/10/31 Age: 35 y.o. Gender: female  Primary Care Provider: Helane Rima, DO  Chief Complaint: Right chest wall pain and worsening cellulitis  History of Present Illness: Please read intern's wonderful H& P for more detailed report.   [name] is a 35 y.o. year old female with hx of DM, HTN, morbid obesity  With recent hospitalization for panniculitis presenting with worsening panniculitis and chest wall pain. Pt was discharged 2 days ago on clindamycin but did not improve with worsening redness and tenderness of skin and overall feeling of fatigue.  She states that she has been taking oral clindamycin as prescribed. Denies fever, chills, nausea vomiting, dysuria, chest pain, shortness of breath dyspnea on exertion or numbness in extremities . She has developed a pain in her chest wall yesterday that onset with precipitating cause. Located central-right sternum pleuritic and reproducible. Made better by CPAP worn nightly.Blood sugars continue to be elevated, fasting was 280 and last pm was 350. She is taking novolog sliding scale as prescribed.  Past Medical History:   Diagnoses   .  DIABETES MELLITUS   .  MORBID OBESITY   .  OBESITY HYPOVENTILATION SYNDROME   .  DEPRESSION   .  OBSTRUCTIVE SLEEP APNEA   .  HYPERTENSION   .  ASTHMA   .  GERD   .  CELLULITIS AND ABSCESS OF NECK   .  BACK PAIN   .  COSTOCHONDRITIS, LEFT   Past Surgical History:  No past surgical history on file.  Social History:  History    Social History   .  Marital Status:  Divorced but now engaged.     Spouse Name:  N/A     Number of Children:  N/A   .  Years of Education:  N/A    Social History Main Topics   .  Smoking status:  Not on file   .  Smokeless tobacco:  Not on file   .  Alcohol Use:  Not on file   .  Drug Use:  Not on file   .   Sexually Active:  Not on file    Other Topics  Concern   .  Not on file    Social History Narrative   .  No narrative on file   Family History:  No family history on file.  Allergies:  Allergies not on file  Current Outpatient Prescriptions   Medication  Sig  Dispense  Refill   .  ARIPiprazole (ABILIFY) 10 MG tablet  Take 10 mg by mouth daily. Per [employer]     .  budesonide-formoterol (SYMBICORT) 160-4.5 MCG/ACT inhaler  Inhale 2 puffs into the lungs 2 (two) times daily.  1 Inhaler  12   .  carvedilol (COREG) 6.25 MG tablet  Take 6.25 mg by mouth 2 (two) times daily with meals.     .  cetirizine (ZYRTEC) 10 MG tablet  Take 10 mg by mouth daily as needed.     .       .       .  glimepiride (AMARYL) 4 MG tablet  TAKE 1 TABLET BY MOUTH EVERY DAY  30 tablet  PRN   .  LANTUS SOLOSTAR 100 UNIT/ML injection  INJECT 45 UNITS SUBCUTANEOUSLY EVERY MORNING AND 45 UNITS SUBCUTANEOUSLY EVERY EVENING  30 mL  PRN   .  lisinopril (PRINIVIL,ZESTRIL) 10 MG tablet  TAKE 1 TABLET BY MOUTH EVERY DAY  30 tablet  PRN   .  NEXIUM 40 MG capsule  TAKE 1 CAPSULE BY MOUTH EVERY MORNING  30 capsule  PRN   .  NOVOLOG FLEXPEN 100 UNIT/ML injection  INJECT SUBCUTANEOUSLY AS DIRECTED BY SLIDING SCALE  30 mL  PRN   .  sertraline (ZOLOFT) 50 MG tablet  Take 50 mg by mouth daily. Per [facility]     .  traMADol (ULTRAM) 50 MG tablet  Take 50 mg by mouth 2 (two) times daily as needed. For breakthru pain     .  VENTOLIN HFA 108 (90 BASE) MCG/ACT inhaler  INHALE 2 PUFFS BY MOUTH EVERY 4 HOURS AS NEEDED FOR SHORTNESS OF BREATH  1 Inhaler  3   .  zolpidem (AMBIEN) 5 MG tablet  Take 5 mg by mouth at bedtime as needed.     Review Of Systems: Per HPI  Physical Exam:  Pulse:  92  Blood Pressure:  129/81  RR:  22  O2:  96 on RA  Temp:  98.1   General: alert, cooperative and no distress  HEENT: PERRLA and extra ocular movement intact  Heart: S1, S2 normal, no murmur, rub or gallop, regular rate and rhythm  Lungs: clear  to auscultation, no wheezes or rales and unlabored breathing. Central sternum and costal margin tender to palpation more left then right. Pain with deep inspiration.  Abdomen: abdomen is soft without significant tenderness, masses, organomegaly or guarding  Extremities: extremities normal, atraumatic, no cyanosis or edema  Skin: Lower abdomen with erythema and induration bilateral to umbilicus right greater then left none extending to genital area, no gas felt. Tender to palpation. No abscesses palpated. Small fissure at left inguinal border, no erythema or pus expressed.  Neurology: normal without focal findings, mental status, speech normal, alert and oriented x3 and PERLA  Labs and Imaging:  Lab Results   Component  Value  Date/Time    NA  135  05/28/2011 3:48 PM    K  4.0  05/28/2011 3:48 PM    CL  97  05/28/2011 3:48 PM    CO2  29  05/28/2011 3:48 PM    BUN  10  05/28/2011 3:48 PM    CREATININE  <0.47*  05/28/2011 3:48 PM    GLUCOSE  332*  05/28/2011 3:48 PM    Lab Results   Component  Value  Date    WBC  8.7  05/28/2011    HGB  13.1  05/28/2011    HCT  38.5  05/28/2011    MCV  88.3  05/28/2011    PLT  336  05/28/2011    D-dimer 0.45  CEs neg x1  CXR: no acute abnormalities  EKG: Right axis deviation otherwise normal   Assessment and Plan:  [name] is a 35 y.o. year old female with uncontrolled T2DM presenting with worsening panniculitis and chest wall pain.  1. Panniculitis. Had initially improved with oral clindamycin while hospitalized , but now is failing treatment evidenced by spreading of erythema on physical exam. No signs of systemic infection currently. Afebrile and no leukocytosis. No abscesses palpated amenable to drainage. Will change to oral keflex and doxycycline for MRSA and strep coverage.  If worsen then will broaden to vanc/zosyn.  Area marked for observation of any spreading.   Wound consult for skin breakage under inguinal fold. Exacerbated by uncontrolled  hyperglycemia, will attempt improved diabetic  control with SSI and carb modified diet. If exam worsens or febrile, will change to IV abx.   2.  AtypicalChest wall pain. . EKG and CEs negative x1. WIll cycle enzymes given risk factors (DM, obesity). EKG in am. Telemetry monitoring. D-dimer negative and stable on room air, no tachycardia. Likely costochondritis which pt has hx of. Toradol in ED did not alleviate, will allow oxycodone 5mg  q6 hr prn.   3. DM. Severely uncontrolled with A1c >14. Pt states she takes lantus 45 u BID and sliding scale novolog at home. Will restart lantus 45 BID here and moderate sliding scale insulin. Carb modified diet. Will likely require increased dose of lantus at DC once she restarts her home diet.   4. HTN. Controlled currently. Will continue lisinopril and coreg at home dose.   5. FEN/GI: carb modified. HHD diet. NS IVF at 1L then SLIV. F/u am BMET.  6. Prophylaxis: Heparin SQ and protonix daily.   7. Disposition: Pending clinical course. To home tomorrow if improved on oral antibiotics and cardiac rule out.

## 2011-05-29 LAB — COMPREHENSIVE METABOLIC PANEL
ALT: 21 U/L (ref 0–35)
BUN: 16 mg/dL (ref 6–23)
CO2: 26 mEq/L (ref 19–32)
Calcium: 9.2 mg/dL (ref 8.4–10.5)
Creatinine, Ser: <0.47 mg/dL — ABNORMAL LOW (ref 0.50–1.10)
Glucose, Bld: 260 mg/dL — ABNORMAL HIGH (ref 70–99)
Sodium: 138 mEq/L (ref 135–145)

## 2011-05-29 LAB — CARDIAC PANEL(CRET KIN+CKTOT+MB+TROPI): Total CK: 39 U/L (ref 7–177)

## 2011-05-29 LAB — CBC
MCH: 30 pg (ref 26.0–34.0)
MCHC: 33.8 g/dL (ref 30.0–36.0)
Platelets: 342 10*3/uL (ref 150–400)
RDW: 12.9 % (ref 11.5–15.5)

## 2011-05-31 ENCOUNTER — Inpatient Hospital Stay: Payer: Medicaid Other | Admitting: Family Medicine

## 2011-06-04 LAB — CULTURE, BLOOD (ROUTINE X 2)
Culture  Setup Time: 201206230421
Culture: NO GROWTH

## 2011-06-06 ENCOUNTER — Telehealth: Payer: Self-pay | Admitting: Family Medicine

## 2011-06-06 NOTE — Telephone Encounter (Signed)
Pt recently hospitalized and sent home on three antibiotics.  She has had problems with some diarrhea since then.  This evening around 10:45pm went to the bathroom and noticed large quantities of blood and clots mixed in with her stool.  She does not have any previous history of significant rectal bleeding, does not appear to be bleeding right at the moment, and does not report any dizziness, nausea, vomiting, or chest pain.  Her diet today has not consisted of anything unusual and has not had large quantities of red foods in it.  Advised pt that rectal bleeding can be a very serious problem and that she should come to the emergency room for evaluation tonight.  Explained that I cannot quantify her blood loss but that someone needs to examine her, likely check her blood level, and possibly check to make certain she doesn't have a significant infection.  Pt voices understanding.

## 2011-06-10 NOTE — Discharge Summary (Signed)
[name], BETSCH NO.:  [id number]  MEDICAL RECORD NO.:  [id number]  LOCATION:  3733                         FACILITY:  [facility]  PHYSICIAN:  Pearlean Brownie, M.D.DATE OF BIRTH:  [birth year]  DATE OF ADMISSION:  05/28/2011 DATE OF DISCHARGE:  05/29/2011                              DISCHARGE SUMMARY   PRIMARY CARE PROVIDER:  Helane Rima, MD in [facility].  DISCHARGE DIAGNOSES: 1. Panniculitis. 2. Diabetes. 3. Atypical chest pain. 4. Obesity hypoventilation syndrome. 5. Chronic obstructive pulmonary disease.  DISCHARGE MEDICATIONS: 1. Symbicort 2 puffs inhaled twice daily. 2. Cipro 500 mg 1 tablet oral twice daily. 3. Carvedilol 6.25 mg by mouth twice daily. 4. Keflex 500 mg 3 times a day for 10 days. 5. Doxycycline 100 mg tabs by mouth twice daily for 10 days. 6. Lantus 45 units in the morning and 45 units in the evening. 7. Albuterol inhaler 2 puffs as needed inhaled for shortness of     breath. 8. Ambien 5 mg tabs daily at bedtime as needed. 9. Zyrtec 10 mg daily. 10.Amaryl 4 mg 1 tablet by mouth daily. 11.Lisinopril 10 mg daily. 12.NovoLog sliding scale 2 to 15 units daily. 13.Percocet 5 mg IR tabs one every 4 hours as needed for pain.  The patient is to stop taking the following medications: 1. Clindamycin 300 mg t.i.d.  PERTINENT PROCEDURES AND LAB STUDIES:  The patient did while here have a CBC done that did not show any type of elevated white blood cell count. The patient does have some blood cultures pending.  Primary care physician should call them up.  It was likely that the patient will be getting better and did not seem systemic and did not need to stay for her hospitalization.  The patient's D-dimer was within the normal range of 0.4.  The patient's chest x-ray was normal during this hospitalization.  The patient did have a CT scan done on the 19th previously of June that did not show any type of abscess  formation and on clinical exam, no abscess was palpated.  BRIEF HOSPITAL COURSE:  [name] is a 35 year old morbidly obese female who had a recent hospitalization with the same panniculitis, was put on clindamycin 300 mg q.i.d. and sent home.  The patient was improving while in the hospital.  While she was at home though her panniculitis began worse with worsening erythema and pain. 1. Panniculitis.  When the patient did arrive, the patient did have     worsening erythema and per Dr. Cristal Ford who saw her during her last     admission.  On physical exam, she did have erythema that was the     lower portion of her pannus bilaterally, which previously was only     unilateral on the right side.  The patient was put on Keflex and     doxycycline immediately, did show within 12 hours a good remission     of the erythema.  The patient was also added Ciprofloxacin due to     the patient's diabetes and the potential for the patient having     Pseudomonas as a possible block.  The patient  seemed to be doing     very well, tolerating p.o. intake, so the patient was discharged     home with oral medications with the idea of close followup with her     PCP in the near future. 2. Diabetes.  The patient's blood sugar was elevated at 336.  At the     time of admission, the patient did come down to approximately 258.     The patient's Lantus was increased to 15 units b.i.d. to try to     suffice and get better control with her blood sugars.  Once again     did not go too aggressive due to the patient having infection     possibility that some of these elevated blood sugars due to the     patient's body response to the infection. 3. The patient did complain of some atypical reproducible palpable     chest pain.  The patient had a history of costochondritis.  The     patient did have cardiac enzymes during her time here and they were     negative x3 and the pain had resolved by the time of the patient's      discharge. 4. Hypertension.  The patient was very good and had cool during her     whole hospitalization during the time here. 5. Obesity hypoventilation syndrome.  The patient was put on her CPAP     at home and did very well.  DISCHARGE INSTRUCTIONS:  The patient is to continue to monitor the erythema, a line was marked at this time.  She should continue on all her medications as prescribed and to call her primary care physician on Monday to make a followup appointment.  Also try to strictly stick to a diabetic diet.  FOLLOWUP APPOINTMENTS:  The patient will be calling her PCP for followup appointment.  DISCHARGE CONDITION:  The patient was discharged home in stable medical condition.  The patient's erythema had rescinded more inferior and was well below the patient's umbilical bilaterally and did not resolve the flakes at all during her time.  There was no erythema passing the inguinal area bilaterally.     Antoine Primas, DO   ______________________________ Pearlean Brownie, M.D.    ZS/MEDQ  D:  05/29/2011  T:  05/30/2011  Job:  [id number]  cc:   Helane Rima, MD  Electronically Signed by Antoine Primas  on 05/31/2011 57:84:69 AM Electronically Signed by Pearlean Brownie M.D. on 06/10/2011 10:18:26 AM

## 2011-06-10 NOTE — H&P (Signed)
[name], [name] NO.:  [id number]  MEDICAL RECORD NO.:  [id number]  LOCATION:  3733                         FACILITY:  [facility]  PHYSICIAN:  Pearlean Brownie, M.D.DATE OF BIRTH:  [birth year]  DATE OF ADMISSION:  05/28/2011 DATE OF DISCHARGE:                             HISTORY & PHYSICAL   PRIMARY CARE PHYSICIAN:  Helane Rima, MD  CHIEF COMPLAINT:  Right chest wall pain and worsening cellulitis.  HISTORY OF PRESENT ILLNESS:  This is a 35 year old female with a history of type 2 diabetes, hypertension, morbid obesity, who presents with worsening panniculitis and new chest wall pain.  The patient was recently discharged 2 days ago for treatment of panniculitis.  This had improved initially on oral clindamycin during her hospitalization and she had no development of fevers or leukocytosis or abscesses.  At that time, she also had uncontrolled diabetes with hyperglycemia in the 300-400s, and A1c greater than 14.  This is secondary to noncompliance with insulin. Since her discharge, the patient thinks her redness and tenderness have spread to include more of her left lower abdomen.  She also has a small breakage in her skin under her inguinal fold, but denies trauma.  She states that she has been taking her oral clindamycin as prescribed as well as her insulin.  She denies fever, nausea, vomiting, diarrhea, and malaise.  She has developed also a pain in her right chest wall since yesterday that had no precipitating cause.  This is located in her right central sternum.  It is worse with deep breathing, coughing, and movement of any kind.  It is made better only by her nightly CPAP which she wears for obstructive sleep apnea and obesity hypoventilation.  The patient denies shortness of breath, fevers, cough, or associated palpitations.  Notably, her blood sugars continued to be elevated with fasting level 280 this a.m. and her last p.m. measurement being  greater than 350.  She also states she is taking her NovoLog sliding scale in addition to Lantus 45 b.i.d.  PAST MEDICAL HISTORY: 1. Diabetes mellitus type 2. 2. Morbid obesity. 3. Obesity hypoventilation syndrome. 4. Depression. 5. OSA. 6. Hypertension. 7. Asthma. 8. Gastroesophageal reflux disease. 9. History of cellulitis. 10.Back pain. 11.Left costochondritis. 12.Bipolar disorder.  PAST SURGICAL HISTORY: 1. C-section. 2. Right knee arthroscopy in 2011.  SOCIAL HISTORY:  The patient denies smoking, alcohol, or drug use.  She is engaged and has a daughter.  FAMILY HISTORY:  Mother with type 2 diabetes.  ALLERGIES:  NUBAIN.  MEDICATIONS: 1. Symbicort 160/4.5 mcg inhaler 2 puffs t.i.d. 2. Coreg 6.25 mg p.o. b.i.d. with meals. 3. Zyrtec 10 mg p.o. daily. 4. Amaryl 4 mg p.o. daily. 5. Lantus 45 units b.i.d. 6. NovoLog sliding scale. 7. Lisinopril 10 mg p.o. daily. 8. Nexium 40 mg p.o. q.a.m. 9. Zoloft 50 mg p.o. daily. 10.Tramadol 50 mg p.o. b.i.d. 11.Ambient 5 mg p.r.n. at bedtime.REVIEW OF SYSTEMS:  See HPI.  The patient also denies any skin trauma, dysuria, cough, nausea, vomiting, diarrhea, or fevers, otherwise review of systems negative.  PHYSICAL EXAMINATION:  VITAL SIGNS:  Pulse 92, blood pressure 129/81, respirations 22, O2 sat 96% on room air, temperature 98.1.  GENERAL APPEARANCE:  Alert, cooperative, in no apparent distress. HEENT:  PERRLA.  EOMI.  Mucous membranes moist. HEART:  Regular rate and rhythm.  No murmurs, gallops, or rubs. LUNGS:  Nonlabored.  Clear to auscultation bilaterally.  Diminished breath sounds secondary to obesity, but no wheezes auscultated.  Central sternum and costal margin are tender to palpation and has pain with deep inspiration. ABDOMEN:  Obese, soft with no intraabdominal tenderness or masses. EXTREMITIES:  Trace bilateral lower extremity edema and no skin breakdown. SKIN:  Lower abdomen has erythema and induration  bilaterally to the umbilicus.  This is moderately tender to palpation.  No abscesses are palpated.  There is a small fissure at left inguinal border under her skin fold, but no erythema or pus is expressed.  Ink is outlining the margin of erythema. NEUROLOGIC:  No focal motor deficits.  Speech is normal.  Alert and oriented x3.  LABS AND IMAGING: 1. Sodium 135, potassium 4.0, chloride 97, bicarb 29, BUN 10,     creatinine less than 0.47, glucose 332.  White blood count 8.7,     hemoglobin 13.1, hematocrit 38.5, MCV 88, platelets 336.  D-dimer     0.45.  Cardiac enzymes negative x1. 2. Chest x-ray shows no acute abnormalities. 3. EKG shows normal sinus rhythm and no ST or T-wave changes per EDP.  ASSESSMENT AND PLAN:  This is a 35 year old female with uncontrolled type 2 diabetes who presents with worsening panniculitis and atypical chest wall pain.  1. Panniculitis.  The patient had initially improved with oral     clindamycin therapy but is now failing treatment, evidenced by the     spreading of erythema on physical exam.  There are no signs of     systemic infection currently as she is afebrile with no     leukocytosis.  Also, no abscesses are palpated that are amenable to     drainage.  We will change antibiotic regimen to oral Keflex and     doxycycline for methicillin-resistant Staphylococcus aureus and     streptococcal coverage.  Will monitor clinical exam in reference     to ink boundaries marked on skin.  Follow up a.m. CBC and fever     curve.  Wound consult for skin breakage under her inguinal fold.     Likely this is exacerbated by her uncontrolled hyperglycemia and     will attempt to improve diabetic control with moderate sliding     scale insulin in her carb-modified diet while inpatient.  If exam     worsens or she becomes febrile, we will change to IV antibiotic     regimen empirically. 2. Chest wall pain.  This is reproducible on exam and pleuritic in      nature.  EKG and cardiac enzymes are negative x1.  Notably, the     patient has a history of costochondritis, however, we will cycle     cardiac enzymes given her risk factors of diabetes and obesity.     Followup EKG in the a.m. with telemetry monitoring overnight.  D-     dimer was negative and she is stable on room air with no     tachycardia or concerns for PE currently.  Toradol administered in     the emergency department did not alleviate her pain, therefore we     will allow oxycodone 5 mg q.6 hours p.r.n. overnight. 3. Diabetes mellitus.  This is severely uncontrolled with latest A1c  greater than 14.  The patient states she does take Lantus 45 units     b.i.d. and sliding scale insulin at home.  We will restart Lantus     45 b.i.d. here and moderate sliding scale insulin with carb-     modified diet in hopes with better dietary control.  We will likely     need increase her dose of Lantus after discharge when she restart     her home diet. 4. Hypertension, is controlled currently.  We will continue lisinopril     and Coreg home dose. 5. Fluids, electrolytes, and nutrition.  Start carb-modified diet, 1     liter normal saline bolus followed by saline lock IV, and follow up     a.m. BMET. 6. Prophylaxis.  Heparin subcu q.8 hours and Protonix daily. 7. Disposition.  Pending clinical course, possibly home tomorrow if     improved on oral antibiotics and cardiac pain ruled out.    ______________________________ Lloyd Huger, MD   ______________________________ Pearlean Brownie, M.D.    JK/MEDQ  D:  05/28/2011  T:  05/29/2011  Job:  [id number]  Electronically Signed by Lloyd Huger MD on 06/02/2011 02:52:01 PM Electronically Signed by Pearlean Brownie M.D. on 06/10/2011 10:18:16 AM

## 2011-06-10 NOTE — Discharge Summary (Signed)
[name], [name]                ACCOUNT NO.:  [id number]  MEDICAL RECORD NO.:  [id number]  LOCATION:  5020                         FACILITY:  [facility]  PHYSICIAN:  Pearlean Brownie, M.D.DATE OF BIRTH:  1976/09/07  DATE OF ADMISSION:  05/25/2011 DATE OF DISCHARGE:  05/26/2011                              DISCHARGE SUMMARY   DISCHARGE DIAGNOSES: 1. Panniculitis 2. Diabetes mellitus, uncontrolled. 3. Hyperglycemia, improved. 4. Hypertension. 5. Obstructive sleep apnea. 6. Obesity hypoventilation syndrome. 7. Seasonal allergies. 8. Gastroesophageal reflux disease.  LABS AND STUDIES: 1. A1c 14.6. 2. Blood glucose 450 on arrival.  BRIEF HOSPITAL COURSE:  This is a 35 year old female with a history of diabetes mellitus who presents with abdominal wall pain and panniculitis and hyperglycemia. 1. Panniculitis.  The patient presented after several days of     increasing abdominal wall pain, tenderness.  She has a previous     history of skin infections in similar distribution.  The patient     initially presented to [facility] Emergency Department for this     problem and found to have hyperglycemia in the 400s, white count     unremarkable, afebrile, however, given the physical exam findings     of erythema and significant area of involvement, she was     admitted for antibiotic therapy.  Bedside ultrasound did not reveal     underlying abscess, therefore the patient was started on oral     clindamycin for empiric coverage of both Staphylococcus and     streptococcal species.  The patient was monitored overnight on this     therapy and found to have improved physical exam on day 2 of     hospitalization.  She remained afebrile and her associated nausea     improved.  She will continue a 10-day course of oral clindamycin     and follow up with her PCP. 2. Diabetes mellitus.  The patient endorses taking Lantus 45 units     b.i.d. and sliding scale insulin at times, however,  does     occasionally skip her Lantus dosing.  For this reason, her diabetes     is severely uncontrolled with an A1c of 14.6.  Indeed, she did     present with blood glucose greater than 450 on admission.  She was     restarted on half dose of her Lantus given her nausea, but had     regained appetite, therefore was discharged with full dose of 45     units b.i.d.  her blood sugars had improved to the low 200 range on     morning of discharge on her half dose Lantus and sliding scale     coverage.  She will follow up with her PCP in 1-2 weeks. 3. Hypertension.  The patient remained controlled on lisinopril and we     will continue this as an outpatient.  FOLLOWUP ISSUES:  Diabetic control.  FOLLOWUP APPOINTMENTS:  Dr. Clementeen Graham at [facility] on June 13, 2011, at 1:30 p.m.  DISCHARGE CONDITION:  The patient was discharged to home in stable medical condition.    ______________________________ Lloyd Huger, MD   ______________________________  Pearlean Brownie, M.D.    Cleotis Lema  D:  05/26/2011  T:  05/27/2011  Job:  [id number]  Electronically Signed by Lloyd Huger MD on 06/02/2011 02:45:16 PM Electronically Signed by Pearlean Brownie M.D. on 06/10/2011 10:18:02 AM

## 2011-06-11 IMAGING — MR MR [PERSON_NAME] LOW JT W/O CM*R*
4 of 5 series · 19 of 40 positions shown · non-contrast
Comparison: Radiographs 02/14/2010.

CLINICAL DATA: Medial knee pain for 5 months, increasing with
activity.  No acute injury or prior relevant surgery.

MRI OF THE RIGHT KNEE WITHOUT CONTRAST
TECHNIQUE: Multiplanar, multisequence MR imaging of the right knee
was performed.  No intravenous contrast was administered.

[Series 4: PD fat-sat · axial · 3.5mm · 0.31mm/px · z∈[-46,+39]mm · 8 of 23 slices shown (1 of 2)]
[im 1/23]
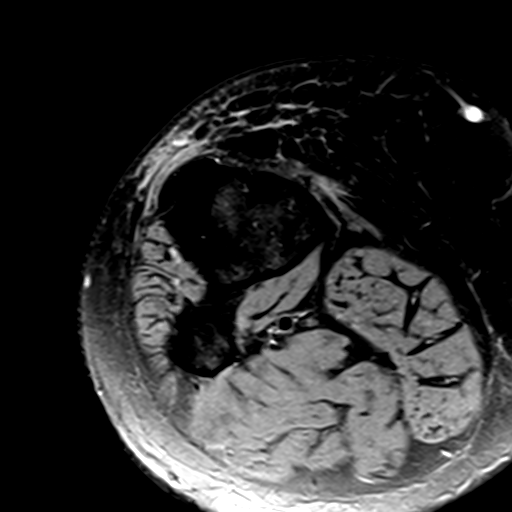
[im 4/23]
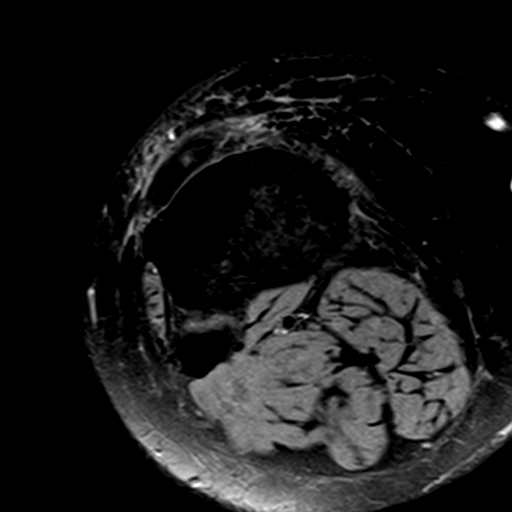
[im 7/23]
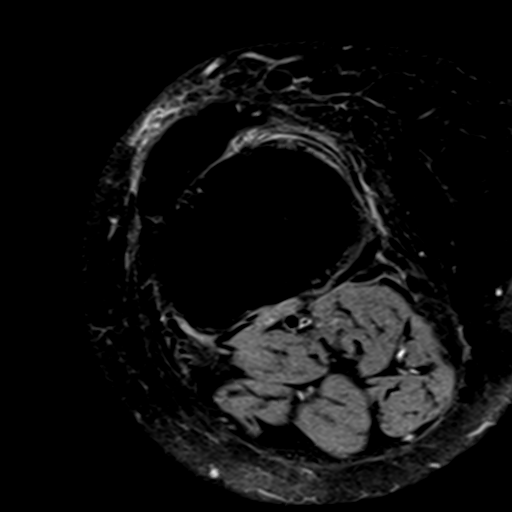
[im 10/23]
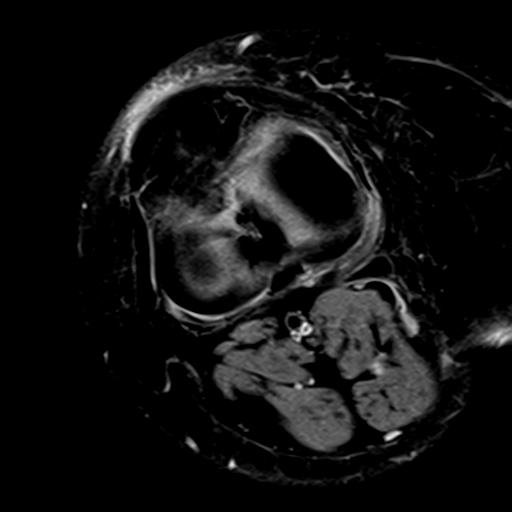
[im 13/23]
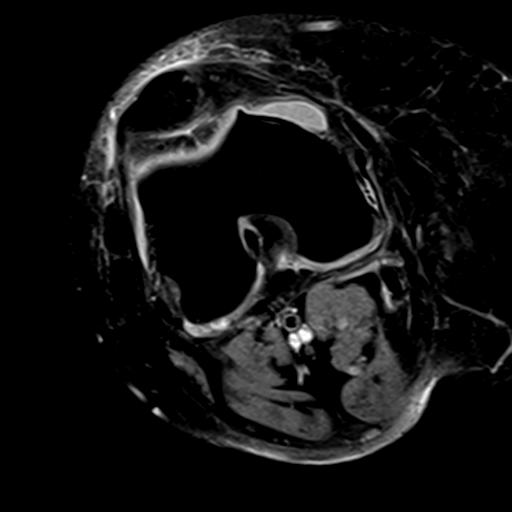
[im 16/23]
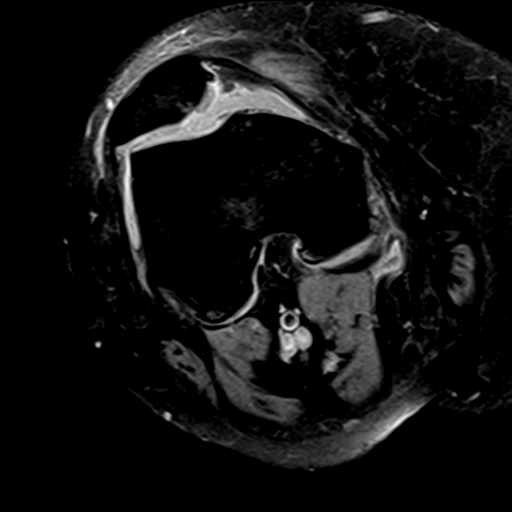
[im 19/23]
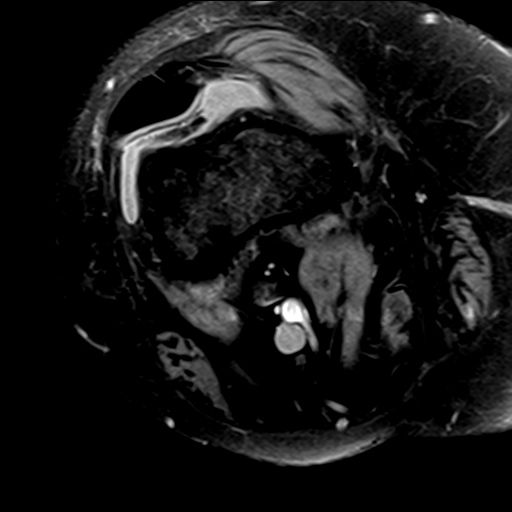
[im 23/23]
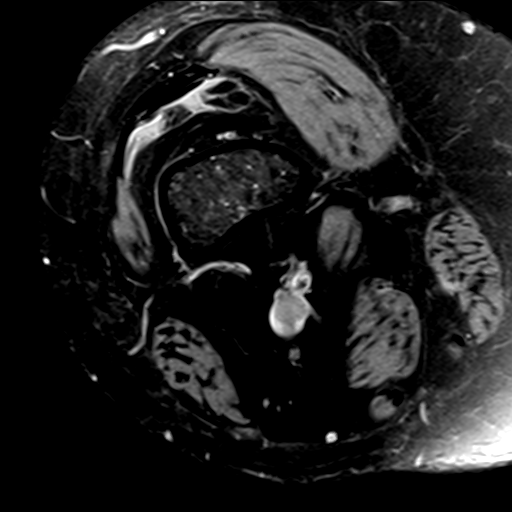

[Series 5: T2 fat-sat · coronal · 3.5mm · 0.31mm/px · 5 of 20 slices shown]
[im 1/20]
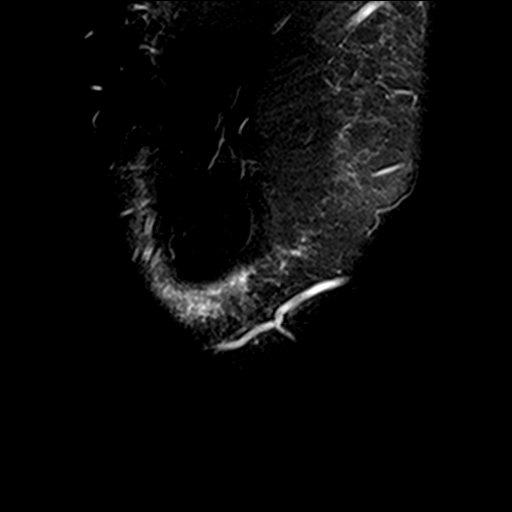
[im 3/20]
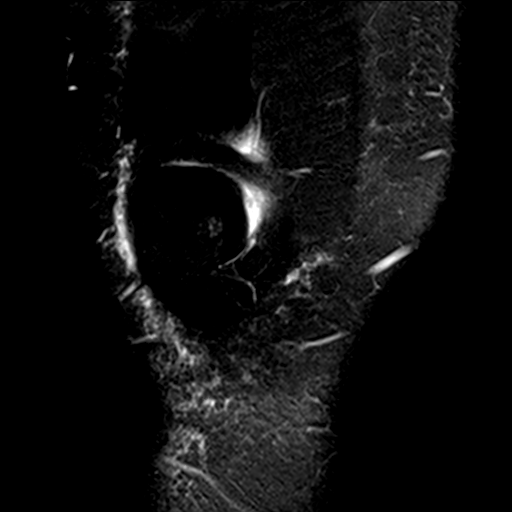
[im 6/20]
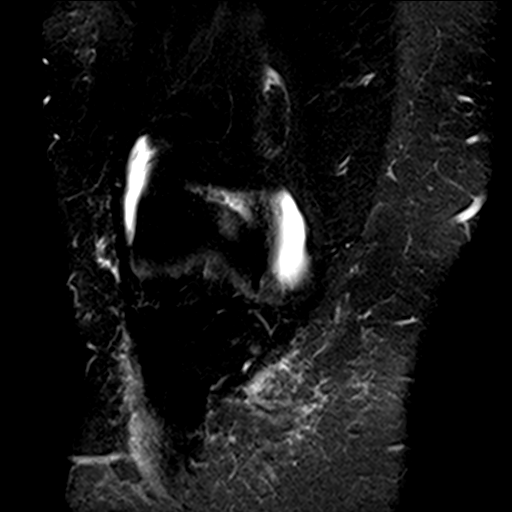
[im 11/20]
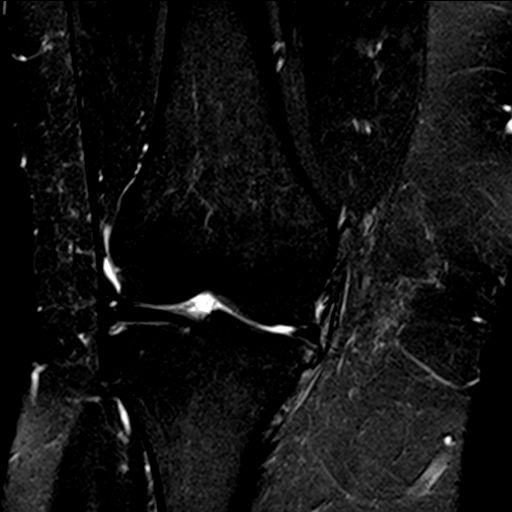
[im 17/20]
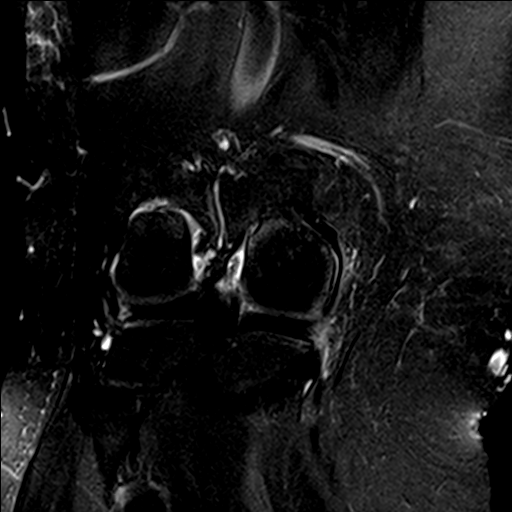

[Series 6: T1 · coronal · 3.5mm · 0.31mm/px · 3 of 20 slices shown]
[im 3/20]
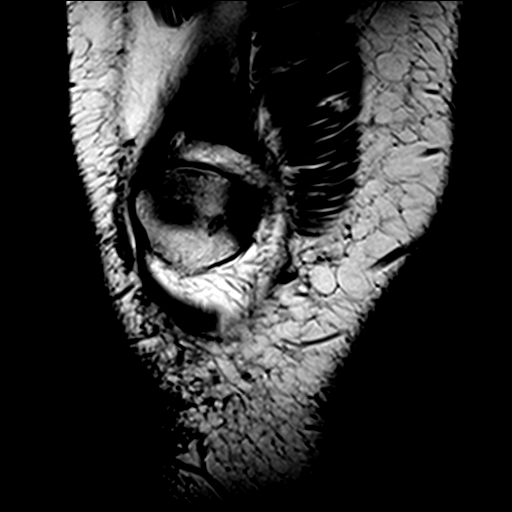
[im 11/20]
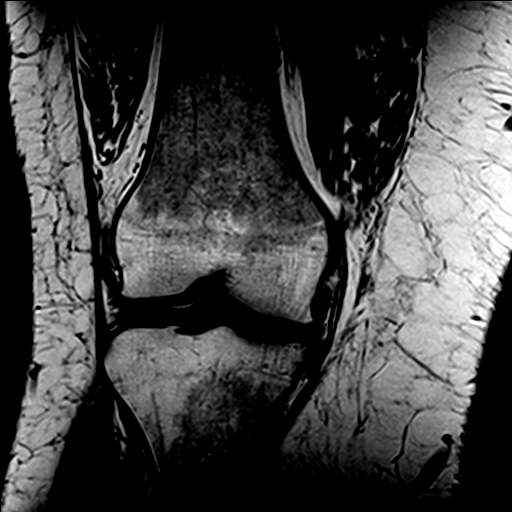
[im 17/20]
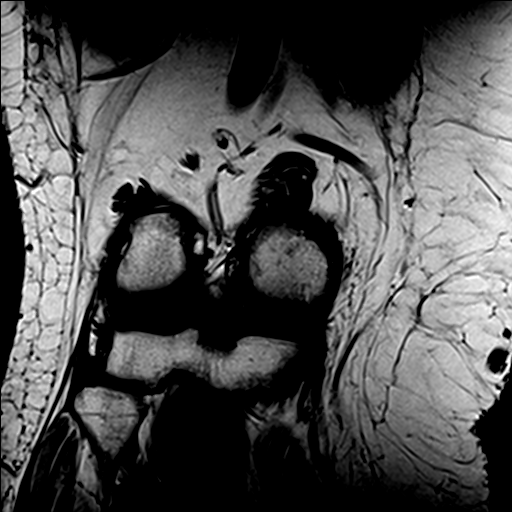

[Series 7: PD fat-sat · coronal · 3.5mm · 0.31mm/px · 3 of 20 slices shown (2 of 2)]
[im 3/20]
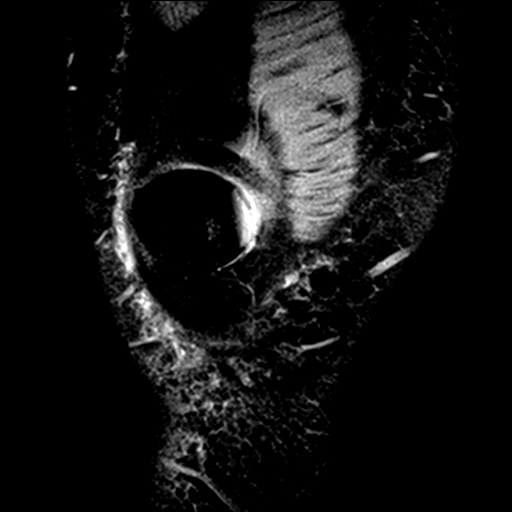
[im 11/20]
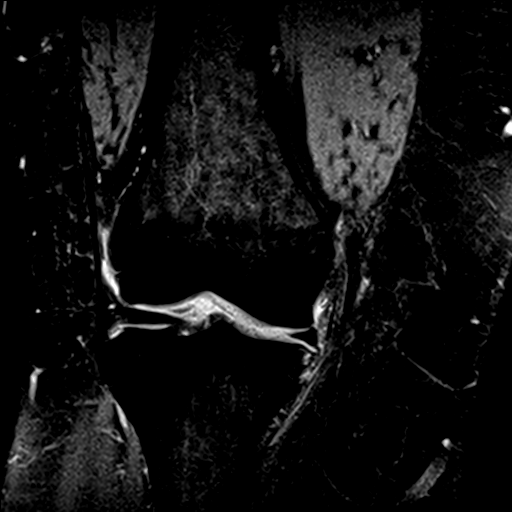
[im 17/20]
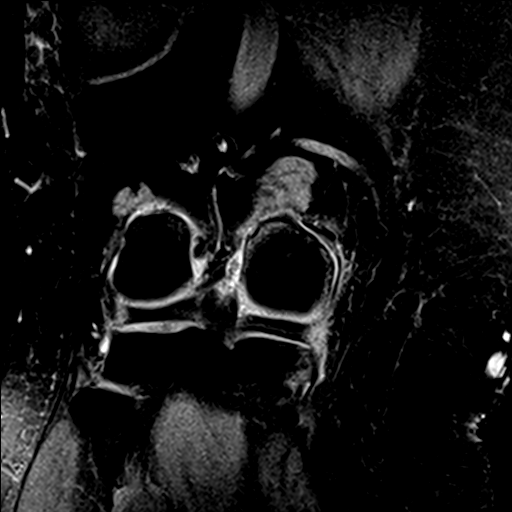

[19 of 40 positions shown; findings below may reference images not displayed]

FINDINGS: There is a small knee joint effusion and a small Baker's
cyst.  There is a small loose body in the Baker's cyst.  There is
mild prepatellar soft tissue edema.  The extensor mechanism is
intact.  There are small patellofemoral osteophytes with moderate
chondromalacia and at the patellar apex and lateral facet.

There is moderate articular cartilage thinning in the medial
compartment.  The medial meniscus is mildly degenerated without
evidence of tear.  The medial collateral ligament is also
degenerated, but intact.

Centrally, the cruciate ligaments are intact. The articular
cartilage in the lateral compartment is preserved.  The lateral
meniscus and lateral collateral ligament complex appear normal.
IMPRESSION: 1.  Moderately advanced medial and patellofemoral compartment
degenerative changes for age.  No acute osteochondral findings.
2.  Mild MCL and medial meniscal degeneration.  No acute
ligamentous findings or evidence of meniscal tear.
3.  Small loose body within a small Baker's cyst.

## 2011-06-14 ENCOUNTER — Encounter: Payer: Self-pay | Admitting: Family Medicine

## 2011-06-14 ENCOUNTER — Ambulatory Visit (INDEPENDENT_AMBULATORY_CARE_PROVIDER_SITE_OTHER): Payer: Medicaid Other | Admitting: Family Medicine

## 2011-06-14 VITALS — BP 134/89 | HR 76 | Temp 98.3°F | Ht 62.0 in | Wt 361.0 lb

## 2011-06-14 DIAGNOSIS — M793 Panniculitis, unspecified: Secondary | ICD-10-CM | POA: Insufficient documentation

## 2011-06-14 DIAGNOSIS — K602 Anal fissure, unspecified: Secondary | ICD-10-CM | POA: Insufficient documentation

## 2011-06-14 DIAGNOSIS — E119 Type 2 diabetes mellitus without complications: Secondary | ICD-10-CM

## 2011-06-14 NOTE — Progress Notes (Signed)
1) Panniculitis: recently in the hospital for this. Was sent home on oral ABX. Did well and completed the course 4 days ago. Currently thinks her panis is about to baseline. It no longer hurts. No drainage. Feels well.   2) DM: Hb A1c was over 14 when checked at the last hospitalization. She takes lantus 45 units twice a day, amryl and SSI novolog. She often forgets to take her medication and check her BS. Her DM log book was completed. The fasting sugar was around 250s most of the time with max in 350s. Nothing under 200. She does not eat a low carbohydrate diet.   3) Diarrhea: Notes diarrhea for around 1 month. Yesterday noted some BRBPR on her toilet paper. No painful stools. No abdominal pain or fevers. Feels well otherwise.   4) Breathing: Has obesity hypoventilation syndrome for which she uses a CPAP at night.  She feels better now that she is using the CPAP. No tobacco use.   PMH reviewed.  ROS as above otherwise neg  Exam:  Vs noted.  Gen: Well NAD, morbidly obese HEENT: EOMI, PERRL, MMM Lungs: CTABL Nl WOB Heart: RRR no MRG Abd: NABS, NT, ND Exts: Non edematous BL  LE Skin: Slightly pink pannus on right. No discharge. Not tender or warm. No edema in abdominal wall.  Rectum: No ext hemorrhoids noted. Nothing visible on anoscope. However on digital exam I note a painful are on 6 o'clock position of the anus.   Hemoccult test is positive

## 2011-06-14 NOTE — Assessment & Plan Note (Signed)
>>  ASSESSMENT AND PLAN FOR INSULIN DEPENDENT TYPE 2 DIABETES MELLITUS ([name]) WRITTEN ON 06/14/2011  7:36 PM BY COREY, Michel Harrow, MD  I am very concerned about this issue. Her Hb A1c is over 14. Her daily fasting glucose is over 250 and she has no lows. THis indicates either medication non-adherence or a high amount of carbohydrates.  Plan: Close follow up. Additionally will increase the lantus dose by 4 units every 3 days if the fasting BS is >200. Will also work on food intake. Pt will record a 1 week food log prior to the next visit. Will follow.

## 2011-06-14 NOTE — Patient Instructions (Addendum)
Thank you for coming in today. 1) Diabetes: Check your blood sugar every morning. If it is higher than 200 for 3 days in a row go up on your lantus by 4.  Continue your sliding scale insulin.  2) Cellulitis: We will watch it and let me know if it is getting worse again.  3) Diarrhea: We will follow up it in 1 month.  4) Breathing: Use your CPAP machine every night.  5) Write down everything you eat for the week before you see me again.  Try to eat at least 1 fruit or vegetable with every meal. Potato's, and corn do not count. Collards are great but no butter or fat with them.

## 2011-06-14 NOTE — Assessment & Plan Note (Signed)
I think this is the cause of the bright red blood per rectum. Pt notes pain when I palpate the 6 o'clock position of the rectum.  Plan: Will follow. Advised stool softners.

## 2011-06-14 NOTE — Assessment & Plan Note (Signed)
Resolving with oral antibiotics.  No changes planned. Will follow.

## 2011-06-14 NOTE — Assessment & Plan Note (Signed)
As noted in DM her diet is poor. Pt is morbidly obese. Plan for 7 day food log. Will follow. Additionally will encourage to eat more fruit and vegetables. Will follow.

## 2011-06-14 NOTE — Assessment & Plan Note (Signed)
I am very concerned about this issue. Her Hb A1c is over 14. Her daily fasting glucose is over 250 and she has no lows. THis indicates either medication non-adherence or a high amount of carbohydrates.  Plan: Close follow up. Additionally will increase the lantus dose by 4 units every 3 days if the fasting BS is >200. Will also work on food intake. Pt will record a 1 week food log prior to the next visit. Will follow.

## 2011-07-15 ENCOUNTER — Encounter: Payer: Self-pay | Admitting: Family Medicine

## 2011-07-15 ENCOUNTER — Ambulatory Visit (INDEPENDENT_AMBULATORY_CARE_PROVIDER_SITE_OTHER): Payer: Medicaid Other | Admitting: Family Medicine

## 2011-07-15 VITALS — BP 111/81 | HR 91 | Temp 97.8°F | Ht 62.0 in | Wt 364.0 lb

## 2011-07-15 DIAGNOSIS — E119 Type 2 diabetes mellitus without complications: Secondary | ICD-10-CM

## 2011-07-15 DIAGNOSIS — R05 Cough: Secondary | ICD-10-CM

## 2011-07-15 DIAGNOSIS — R059 Cough, unspecified: Secondary | ICD-10-CM

## 2011-07-15 DIAGNOSIS — E669 Obesity, unspecified: Secondary | ICD-10-CM

## 2011-07-15 MED ORDER — AZITHROMYCIN 250 MG PO TABS: 500.0000 mg | ORAL_TABLET | Freq: Every day | ORAL | Status: DC

## 2011-07-15 MED ORDER — NYSTATIN 100000 UNIT/GM EX POWD: Freq: Four times a day (QID) | CUTANEOUS | Status: DC

## 2011-07-15 NOTE — Assessment & Plan Note (Addendum)
Currently increasing insulin at home in response to persistent hyperglycemia. She is currently at 47 units of lantus bid.  Plan: Increase to 50 today and continue to increase the insulin by 2-3 units every 3 days if fasting glucose is over 200. Continue SSI.  Discussed the importance of a diabetes diet.  Will follow up and red flags reviewed.

## 2011-07-15 NOTE — Progress Notes (Signed)
1) CHRONIC DIABETES  Disease Monitoring  Blood Sugar Ranges: 299-400s  Polyuria: no   Visual problems: no   Medication Compliance: yes, has increased the lantus to 47 units bid and SSI 7-10 units with meals  Medication Side Effects  Hypoglycemia: no   Diet: has not changed her diet at all. She states that she just was not motivated to change.   2) Cough: Notes wheeze, sinus congestion, and cough for 1 week. 1.5 weeks ago Elaura was visiting family and was exposed to someone with a cold. She developed cold symptoms following that visit. It has continues and is perhaps getting worse. She is using her albuterol currently which does seem to help. She denies any inability to catch her breath.   PMH reviewed.  ROS as above otherwise neg  Exam:  BP 111/81  Pulse 91  Temp(Src) 97.8 F (36.6 C) (Oral)  Ht 5\' 2"  (1.575 m)  Wt 364 lb (165.109 kg)  BMI 66.58 kg/m2 Gen: Very obese HEENT: EOMI,  MMM, red nasal turbinates Lungs: NL WOB, distant breath sounds. No wheeze or crackles.  Heart: RRR no MRG Abd: NABS, NT, ND Exts: Non edematous BL  LE, warm and well perfused.

## 2011-07-15 NOTE — Patient Instructions (Signed)
Thank you for coming in today. Take the azithromycin 2 pills daily x 5 days.  Use the nyastatin power up to 4x daily.  Apply antibiotic ointment to your leg.  Come back in 1-2 weeks. Write down what you eat before you come back.  Increase your lantus to 50 units twice a day. Keep going up like we talked about.

## 2011-07-15 NOTE — Assessment & Plan Note (Signed)
>>  ASSESSMENT AND PLAN FOR INSULIN DEPENDENT TYPE 2 DIABETES MELLITUS ([name]) WRITTEN ON 07/20/2011  7:48 AM BY COREY, EVAN S, MD  Currently increasing insulin at home in response to persistent hyperglycemia. She is currently at 47 units of lantus bid.  Plan: Increase to 50 today and continue to increase the insulin by 2-3 units every 3 days if fasting glucose is over 200. Continue SSI.  Discussed the importance of a diabetes diet.  Will follow up and red flags reviewed.

## 2011-07-20 ENCOUNTER — Encounter: Payer: Self-pay | Admitting: Family Medicine

## 2011-07-20 DIAGNOSIS — R05 Cough: Secondary | ICD-10-CM | POA: Insufficient documentation

## 2011-07-20 DIAGNOSIS — R059 Cough, unspecified: Secondary | ICD-10-CM | POA: Insufficient documentation

## 2011-07-20 NOTE — Assessment & Plan Note (Signed)
Cough x nearly 2 weeks. Continues to worsen. I suspect that [name] has a viral URI however she could have a atypical bacteria infection. [name] has poor pulmonary reserve due to her obesity hypoventilation syndrome. I will treat with azithromycin and provide red flag precautions. Will f/u if not improving.

## 2011-07-26 ENCOUNTER — Telehealth: Payer: Self-pay | Admitting: Family Medicine

## 2011-07-26 NOTE — Telephone Encounter (Signed)
Bleeding from the bottom. Is dark red. It started today after going to the bathroom. Not feeling light headed or dizzy. She is feeling sick to her stomach. Is able to eat and drink.  Denies abdominal pain. Thinks there was a little bit of blood in the toilet.   We discussed pros and cons of going to the ED tonight. As she denies any red flags I advised to go to clinic in the morning. Red flags, look out for belly pain, continued bleeding, feeling faint.

## 2011-08-04 ENCOUNTER — Other Ambulatory Visit: Payer: Self-pay | Admitting: *Deleted

## 2011-08-04 ENCOUNTER — Telehealth: Payer: Self-pay | Admitting: Family Medicine

## 2011-08-04 ENCOUNTER — Encounter: Payer: Self-pay | Admitting: Family Medicine

## 2011-08-04 ENCOUNTER — Ambulatory Visit (INDEPENDENT_AMBULATORY_CARE_PROVIDER_SITE_OTHER): Payer: Medicaid Other | Admitting: Family Medicine

## 2011-08-04 ENCOUNTER — Other Ambulatory Visit: Payer: Self-pay | Admitting: Family Medicine

## 2011-08-04 DIAGNOSIS — E119 Type 2 diabetes mellitus without complications: Secondary | ICD-10-CM

## 2011-08-04 DIAGNOSIS — K922 Gastrointestinal hemorrhage, unspecified: Secondary | ICD-10-CM

## 2011-08-04 MED ORDER — INSULIN GLARGINE 100 UNIT/ML ~~LOC~~ SOLN: SUBCUTANEOUS | Status: DC

## 2011-08-04 MED ORDER — INSULIN GLARGINE 100 UNIT/ML ~~LOC~~ SOLN: 50.0000 [IU] | Freq: Two times a day (BID) | SUBCUTANEOUS | Status: DC

## 2011-08-04 MED ORDER — DOCUSATE SODIUM 100 MG PO CAPS: 100.0000 mg | ORAL_CAPSULE | Freq: Two times a day (BID) | ORAL | Status: DC

## 2011-08-04 MED ORDER — GLUCOSE BLOOD VI STRP: ORAL_STRIP | Status: DC

## 2011-08-04 MED ORDER — POLYETHYLENE GLYCOL 3350 17 GM/SCOOP PO POWD: 17.0000 g | Freq: Every day | ORAL | Status: AC

## 2011-08-04 MED ORDER — LANCETS MISC: Status: DC

## 2011-08-04 NOTE — Telephone Encounter (Signed)
Pharmacy calls  needing to verify current insulin instructions because  the rx they recieved states 50 units twice daily. Patient states she is increasing by 2-3 units every three days if blood sugar is over 200 fasting.  Note from MD today states 52 units twice daily. There is a note from 07/15/2011 that states to increase by 2-3 units every three days iIf blood sugar is over 200 fasting. Consulted with Dr. Earnest Bailey and she advises may send in new RX this way. Also patient needs test strips and states she is testing 4-5 times daily. Dr. Earnest Bailey states we can send in RX for four times daily.

## 2011-08-04 NOTE — Telephone Encounter (Signed)
Pt  Called to report blood sugar of 460 this morning.  Feeling well.  Has taken 52 units of lantus and 15 units of SSI.  Told the patient to recheck sugar in two hours and call the clinic for an appointment today.  Sugar has been running in the upper 300s to 400s all week.

## 2011-08-04 NOTE — Progress Notes (Signed)
  Subjective:    Patient ID: [name], female    DOB: 1976-08-15, 35 y.o.   MRN: [id number]  HPI 1. Morbid Obesity Patient has a BMI of 66.7. She has very poor dietary habits and has Type 2 DM likely related to massive insulin resistance. She has a husband who is very supportive. She does no exercise.   2. GI bleeding 2 month history of GI bleeding. Started as BRBPR, assumed to be a fissure. She is passing clots.  She has no dizziness, no menses, no fever, no diarrhea. No epigastric pain, no back pain, no hematuria. She has no blood thinners, no bleeding diathesis.  3. DM HgA1c today 13 down from 14. Dr. Denyse Amass increased her insulin regimen to increase till her Blood sugars are less than 200. They have been ranging in the 400's. She has increased her Lantus to 52 units BID.  No visual changes,no foot ulcers.  4. Right hand pain. Numbness and right thenar eminence pain. Associated with history of carpal tunnel surgery.    Review of Systems  Constitutional: Negative for fever, activity change, appetite change and unexpected weight change.  Eyes: Negative for visual disturbance.  Cardiovascular: Negative for chest pain, palpitations and leg swelling.  Gastrointestinal: Positive for blood in stool and anal bleeding. Negative for nausea, vomiting, abdominal pain, diarrhea, constipation and rectal pain.  Musculoskeletal: Positive for arthralgias.  Skin: Negative for rash.       Objective:   Physical Exam  Nursing note and vitals reviewed. Constitutional: No distress.       Morbidly obese  Cardiovascular: Normal rate, regular rhythm and normal heart sounds.   No murmur heard. Pulmonary/Chest: Effort normal and breath sounds normal. No respiratory distress. She has no wheezes.  Abdominal: Soft. She exhibits no distension. There is no tenderness.  Genitourinary:       Rectal exam deferred, performed recently by Dr. Denyse Amass  Skin: She is not diaphoretic.      Assessment & Plan:    1. Morbid Obesity - advised for 25 minutes about weight loss and dietary changes needed. - will see Nutrition with Rhona Raider.  2. GI bleeding - GI referral for sigmoidoscopy/colonoscopy - miralax/colace for stool softeners.  3. DM - continue increasing insulin as previously prescribed by Dr. Denyse Amass  4. Right hand pain. - hand exercises - NSAIDS

## 2011-08-05 MED ORDER — INSULIN GLARGINE 100 UNIT/ML ~~LOC~~ SOLN: 56.0000 [IU] | Freq: Two times a day (BID) | SUBCUTANEOUS | Status: DC

## 2011-08-10 ENCOUNTER — Telehealth: Payer: Self-pay | Admitting: *Deleted

## 2011-08-10 IMAGING — CR DG CHEST 1V PORT
1 series · 1 of 1 positions shown · non-contrast
Comparison: Chest x-ray of 04/01/2009

CLINICAL DATA: Chest pain, shortness of breath

PORTABLE CHEST - 1 VIEW

[view not recorded]
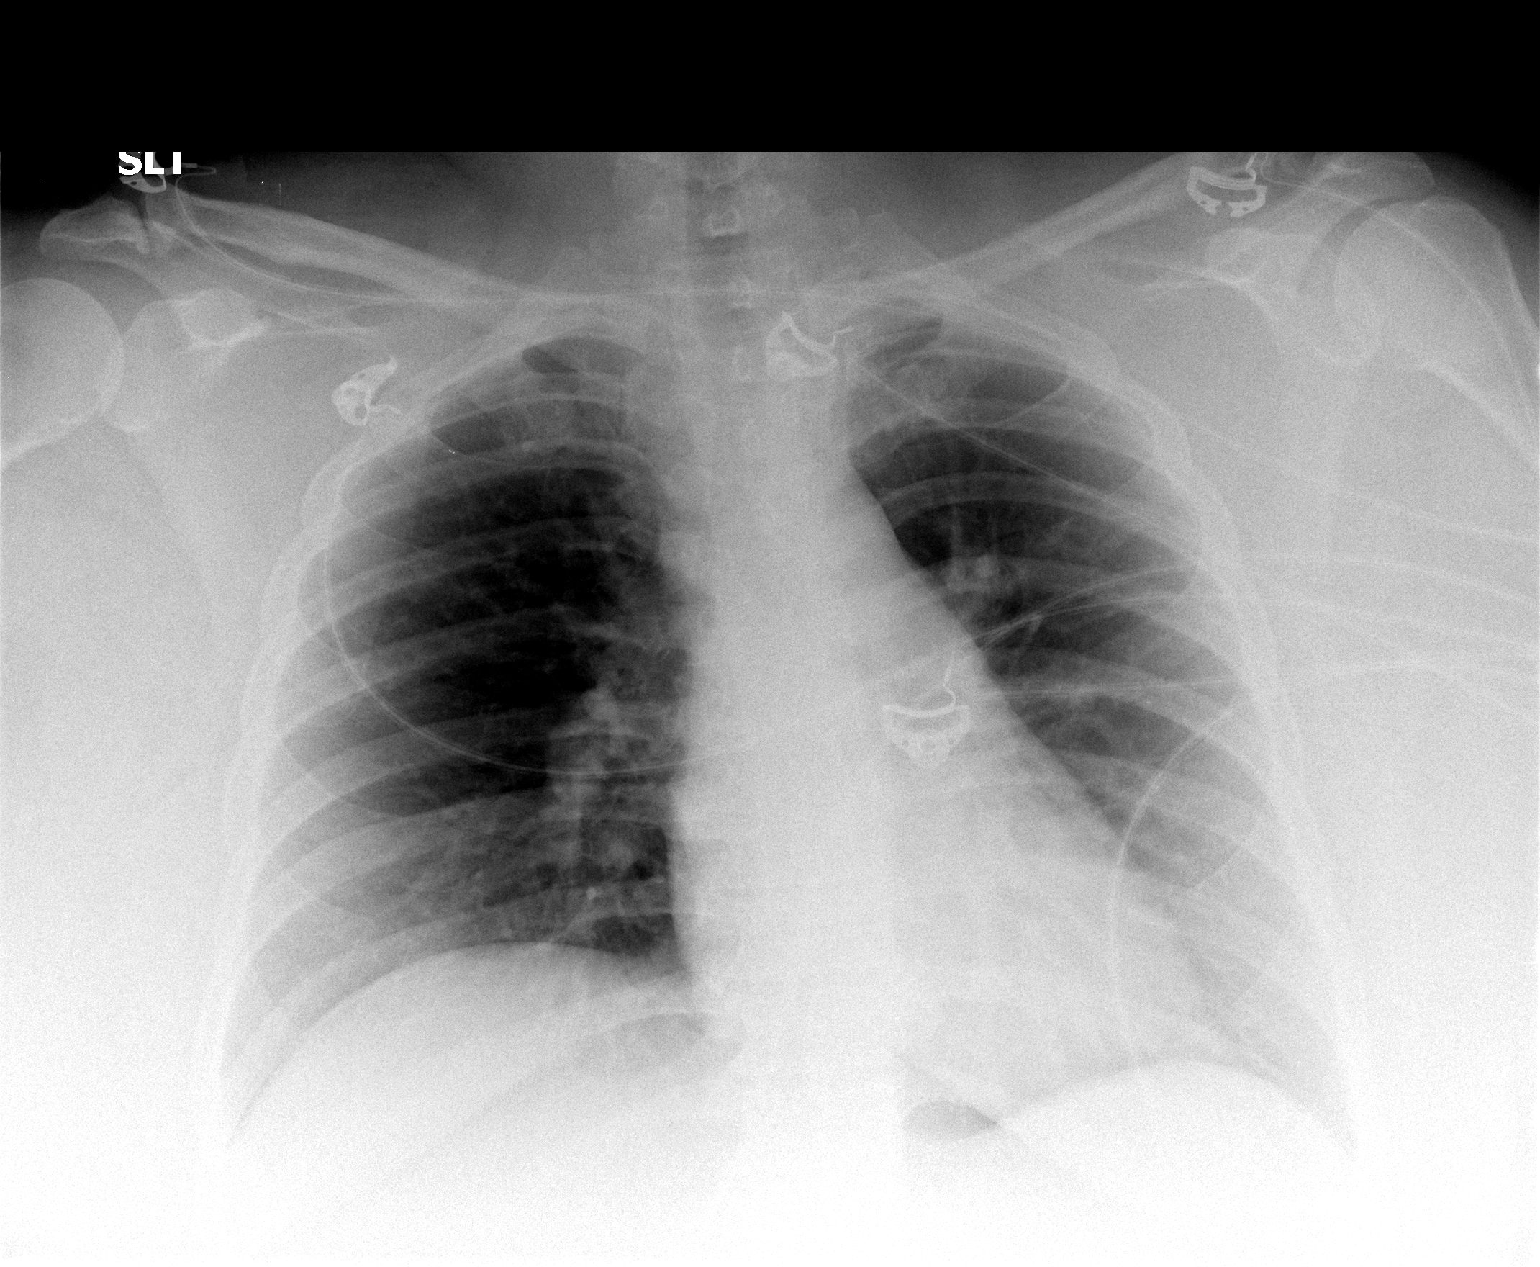

[1 of 1 positions shown; findings below may reference images not displayed]

FINDINGS: The lungs are clear.  The heart is within normal limits
in size.  Mediastinal contours are stable.  No bony abnormality is
seen.
IMPRESSION: Stable chest x-ray.  No active lung disease.

## 2011-08-10 NOTE — Telephone Encounter (Signed)
Called and informed patient of appointment with [facility] on 08/17/11 at 3:45 with Dr Bosie Clos.[name], Rodena Medin

## 2011-08-20 ENCOUNTER — Other Ambulatory Visit: Payer: Self-pay | Admitting: Family Medicine

## 2011-08-20 NOTE — Telephone Encounter (Signed)
Refill request

## 2011-08-24 ENCOUNTER — Ambulatory Visit (INDEPENDENT_AMBULATORY_CARE_PROVIDER_SITE_OTHER): Payer: Medicaid Other | Admitting: Family Medicine

## 2011-08-24 DIAGNOSIS — E119 Type 2 diabetes mellitus without complications: Secondary | ICD-10-CM

## 2011-08-24 NOTE — Progress Notes (Signed)
Medical Nutrition Therapy:  Appt start time: 10:30 end time:  1130.  Assessment:  Primary concerns today: Weight management and Blood sugar control.  Usual eating pattern includes 2 meals and 0-2 snacks per day.  Everyday foods include 0-48 oz soda and 0-32 oz sweet tea (2.5 c sugar/gal).  Avoided foods include kiwi (allergy), and [name] has tried to limit fried foods recently.   24-hr recall: B (7:30 AM)- 16 oz soda, 4 mini-sausage biscuits, 6 c water; L (11:30 AM)- 2 personal pepperoni pizzas, 4 c water; D (6 PM)- 1.5 c spaghetti w/ meat sauce, 12 oz soda; (slept 7:30-10 PM); Snk (10 PM)-  2 c spaghetti w/ meat sauce, 1 c water, 4 [employer], 12 oz soda; (to bed at midnight); Snk (4 AM)- 1 c whole milk.  Usual physical activity includes none.  FBG have been in 300s most days recently.  Patient is resistant to switching to artificial sweeteners, but said she would like to try reducing sugar intake first, then switch to diet drinks next month.    Progress Towards Goal(s):  In progress.   Nutritional Diagnosis:  NB-2.1 Physical inactivity As related to poor motivation.  As evidenced by self-report of no exercise. NI-5.8.2 Excessive carbohydrate intake As related to beverages.  As evidenced by usual intake of at least 800 kcal/day of SSB.    Intervention:  Nutrition education.  Monitoring/Evaluation:  Dietary intake, exercise, BG, and body weight in 3 weeks during [facility].

## 2011-08-24 NOTE — Patient Instructions (Signed)
1. Do not go to bed until at least 2 hours after eating.   2. On days you have spaghetti or pizza, include a vegetable with your entre.   3. At both lunch and dinner, obtain twice as many veg's as either your protein or starch foods: Salad/soup/cooked veg + meat (chicken/beef/pork/fish) + starch (rice/bread/potatoes/ pasta/corn).  4. More fruits & veg's will directly help your blood pressure, and this will also help you lose weight, which itself will help lower blood pressure.   5. Best prices for fruits and veg's:  [employer], [employer]'s, [employer]), & [employer]. 6. The sweet tea you are currently drinking daily is almost 500 calories worth.  The soda contributes an additional 30 calories a day.  Suggest:  Switch to diet drinks only, and reduce the amount of sweetness slowly and progressively over the next few weeks/months.  TASTE PREFERENCES ARE LEARNED.   7. Physical activity: a. [employer] scholarship or [employer]?   Check it out.   b. It's also important to be as active as you can be DURING the day.  For example, if you watch TV, get up out of your chair every time there's an ad.   8. What is in the house will get eaten; think twice about what you bring home.    Follow-up appt with Dr. Denyse Amass AND Dr. Gerilyn Pilgrim:  Thursday, Oct 11 at 3:30 PM.

## 2011-08-30 LAB — POCT RAPID STREP A: Streptococcus, Group A Screen (Direct): POSITIVE — AB

## 2011-08-30 LAB — POCT URINALYSIS DIP (DEVICE)
Glucose, UA: NEGATIVE
Nitrite: NEGATIVE
Operator id: 116391
Urobilinogen, UA: 0.2

## 2011-08-30 LAB — POCT PREGNANCY, URINE: Preg Test, Ur: NEGATIVE

## 2011-09-01 ENCOUNTER — Ambulatory Visit (INDEPENDENT_AMBULATORY_CARE_PROVIDER_SITE_OTHER): Payer: Medicaid Other | Admitting: Family Medicine

## 2011-09-01 ENCOUNTER — Encounter: Payer: Self-pay | Admitting: Family Medicine

## 2011-09-01 VITALS — BP 157/96 | HR 111 | Temp 98.1°F | Ht 62.0 in | Wt 373.0 lb

## 2011-09-01 DIAGNOSIS — G8929 Other chronic pain: Secondary | ICD-10-CM | POA: Insufficient documentation

## 2011-09-01 DIAGNOSIS — G5600 Carpal tunnel syndrome, unspecified upper limb: Secondary | ICD-10-CM

## 2011-09-01 DIAGNOSIS — G5601 Carpal tunnel syndrome, right upper limb: Secondary | ICD-10-CM

## 2011-09-01 DIAGNOSIS — M25561 Pain in right knee: Secondary | ICD-10-CM | POA: Insufficient documentation

## 2011-09-01 DIAGNOSIS — M25569 Pain in unspecified knee: Secondary | ICD-10-CM

## 2011-09-01 DIAGNOSIS — M25562 Pain in left knee: Secondary | ICD-10-CM | POA: Insufficient documentation

## 2011-09-01 HISTORY — DX: Carpal tunnel syndrome, right upper limb: G56.01

## 2011-09-01 MED ORDER — WRIST BRACE/RIGHT XL MISC: 1.0000 [IU] | Freq: Every day | Status: DC

## 2011-09-01 MED ORDER — HYDROCODONE-ACETAMINOPHEN 5-325 MG PO TABS: 1.0000 | ORAL_TABLET | ORAL | Status: AC | PRN

## 2011-09-01 MED ORDER — MELOXICAM 15 MG PO TABS: 15.0000 mg | ORAL_TABLET | Freq: Every day | ORAL | Status: DC

## 2011-09-01 NOTE — Assessment & Plan Note (Signed)
Gave info handout and rx for wrist brace at night.  If continues would need follow-up with her hand surgeon.

## 2011-09-01 NOTE — Assessment & Plan Note (Signed)
>>  ASSESSMENT AND PLAN FOR CHRONIC PAIN OF BOTH KNEES WRITTEN ON 09/01/2011  4:45 PM BY BRISCOE, Sharrie Rothman, MD  Significant bilateral knee pain.  Mild arthritis seen on xrays 1 year ago.  Cannot rule out right meniscal degeneration, and right has had patellar surgery as well as ligament surgery.    Gave rx for hydrocodone and meloxicam.  Will place referral so she can return to see [facility] who did her previous surgeries for further evaluation.

## 2011-09-01 NOTE — Progress Notes (Signed)
  Subjective:    Patient ID: [name], female    DOB: 10/15/1976, 35 y.o.   MRN: [id number]  HPIRight wrist pain:  History of carpal tunnel release many years ago.  No trouble until 2 months ago. No new activities, no injuries.   Sharp pain in thumb .  Numbness in thumb and two fingers.  Pain worst when gripping, now dropping things.  Right handed.  Has been using ibuprofen- 5-6 per day without help.   Has tried for open and close hands , was given exercises, but this hurt.  Nothing relieves.  Both knees painful:  Right knee laparoscopy 2010 with no pain until 1 week ago ([facility]).  No new activities of injuries.  Left knee with history of two surgeries for chipped patellar ([facility]) and repaired ligament (2001-2001) ([place]).  Right: hurts more with walking and bending.  Left, throbbing pain.   No locking, popping.  Left knee gives out at times.   Review of Systems     Objective:   Physical Exam GEN: Alert & Oriented, No acute distress. Morbidly obese Right hand:  No thenar wasting.  Pain over thenar eminence.  Pain over thenar eminence elicited by median nerve compression.  Also numbness consistent over the thumb and pointer finger, palmar surface.  Bilateral knees:  Difficult exam due to body habitus.  Full extension, difficult to get full flexion.  No cellulitis.  Left knee with scar.  No joint laxity.  Pain over patella.  Right knee with pain anteriorly.        Assessment & Plan:

## 2011-09-01 NOTE — Assessment & Plan Note (Signed)
Significant bilateral knee pain.  Mild arthritis seen on xrays 1 year ago.  Cannot rule out right meniscal degeneration, and right has had patellar surgery as well as ligament surgery.    Gave rx for hydrocodone and meloxicam.  Will place referral so she can return to see [facility] who did her previous surgeries for further evaluation.

## 2011-09-01 NOTE — Patient Instructions (Addendum)
Use wrist brace for your wrist and thumb pain.  See orthopedist for your knees  Make follow-up with Dr. Denyse Amass and Raymondo Band for your diabetes

## 2011-09-02 LAB — CBC
HCT: 35.6 — ABNORMAL LOW
Hemoglobin: 12.4
MCHC: 34.9
MCV: 89.3
Platelets: 318
RDW: 13.4

## 2011-09-02 LAB — DIFFERENTIAL
Basophils Absolute: 0
Basophils Relative: 0
Lymphocytes Relative: 36
Monocytes Relative: 7
Neutro Abs: 6
Neutrophils Relative %: 56

## 2011-09-02 LAB — COMPREHENSIVE METABOLIC PANEL
Albumin: 3 — ABNORMAL LOW
Alkaline Phosphatase: 76
BUN: 7
Creatinine, Ser: 0.51
Glucose, Bld: 122 — ABNORMAL HIGH
Potassium: 3.3 — ABNORMAL LOW
Total Protein: 7.1

## 2011-09-02 LAB — POCT CARDIAC MARKERS
CKMB, poc: 2.6
Troponin i, poc: <0.05

## 2011-09-02 LAB — URINALYSIS, ROUTINE W REFLEX MICROSCOPIC
Nitrite: NEGATIVE
Specific Gravity, Urine: 1.029
Urobilinogen, UA: 0.2
pH: 5.5

## 2011-09-02 LAB — URINE MICROSCOPIC-ADD ON

## 2011-09-03 ENCOUNTER — Encounter: Payer: Self-pay | Admitting: Family Medicine

## 2011-09-03 ENCOUNTER — Ambulatory Visit: Payer: Medicaid Other | Admitting: Family Medicine

## 2011-09-03 ENCOUNTER — Ambulatory Visit (INDEPENDENT_AMBULATORY_CARE_PROVIDER_SITE_OTHER): Payer: Medicaid Other | Admitting: Family Medicine

## 2011-09-03 ENCOUNTER — Telehealth: Payer: Self-pay | Admitting: Family Medicine

## 2011-09-03 VITALS — BP 156/89 | HR 99 | Wt 373.0 lb

## 2011-09-03 DIAGNOSIS — E119 Type 2 diabetes mellitus without complications: Secondary | ICD-10-CM

## 2011-09-03 LAB — URINE MICROSCOPIC-ADD ON

## 2011-09-03 LAB — POCT I-STAT, CHEM 8
BUN: 5 — ABNORMAL LOW
Calcium, Ion: 0.97 — ABNORMAL LOW
Hemoglobin: 13.9
TCO2: 27

## 2011-09-03 LAB — URINALYSIS, ROUTINE W REFLEX MICROSCOPIC
Nitrite: NEGATIVE
Specific Gravity, Urine: 1.024
Urobilinogen, UA: 1
pH: 6

## 2011-09-03 LAB — URINE CULTURE: Colony Count: 80000

## 2011-09-03 LAB — MONONUCLEOSIS SCREEN: Mono Screen: NEGATIVE

## 2011-09-03 LAB — RAPID STREP SCREEN (MED CTR MEBANE ONLY): Streptococcus, Group A Screen (Direct): POSITIVE — AB

## 2011-09-03 MED ORDER — FREESTYLE SYSTEM KIT: 1.0000 | PACK | Status: DC | PRN

## 2011-09-03 MED ORDER — INSULIN GLARGINE 100 UNIT/ML ~~LOC~~ SOLN: 75.0000 [IU] | Freq: Two times a day (BID) | SUBCUTANEOUS | Status: DC

## 2011-09-03 NOTE — Assessment & Plan Note (Signed)
I am also very concerned by this problem.  If her BMI remains as high as it currently is she will die within the next 10 years or so. This is my main emphasis this year. [name] is trying to lose weight.  She has cut back on her sugar sweetened beverages.  Plan: I goals are zero sugar sweetened beverages and one fruit or vegetable with every meal. Her goals are walk 10 minutes every day.  Additionally she will followup October 11 with myself and Dr. Gerilyn Pilgrim in nutrition clinic. We will reviewed food recall at that point

## 2011-09-03 NOTE — Patient Instructions (Signed)
Thank you for coming in today. [name]'s Goals: 1) Work on diet ... Don't buy junk food at the grocery store.  2) Walk 10 minutes every day.  Walk once or twice around the block every day.   Dr. Zollie Pee Goals: 1) Eat a vegitable or fruit (bannanas don't count) with every meal.  2) CUT OUT sugar drinks.   For Diabetes.  If you blood sugar is > 250 at breakfast or Lunch take 10 units of novolog.  Use your normal scale for dinner time.   Increase your lantus to 75 units twice a day.  Come back in 1 month.   Get an appointment With Dr. Raymondo Band in the pharmacy clinic to discuss your insulin.

## 2011-09-03 NOTE — Assessment & Plan Note (Signed)
>>  ASSESSMENT AND PLAN FOR INSULIN DEPENDENT TYPE 2 DIABETES MELLITUS ([name]) WRITTEN ON 09/03/2011  2:50 PM BY COREY, Michel Harrow, MD  I am very concerned about the state of [name]' diabetes. Her glucose remains high despite doubling her Lantus recently.. Additionally it seems that her NovoLog dose is insufficient for caloric intake.  Her recent weight gain is attributable to increasing insulin allowing storage of calories.  Plan: Emphasize the importance of moderating her food intake.  If she were to able to cut her carbohydrates and half her diabetes would be at goal with the  insulin she's taking now. This is my most important emphasis.  However in the interim plan to increase Lantus to 75 units twice a day.  Additionally increase NovoLog to at least 10 units with every meal.  She will followup with Dr. Raymondo Band in pharmacy clinic for a new sliding scale. Will see me in one month.

## 2011-09-03 NOTE — Telephone Encounter (Signed)
Fwd. To Dr.Corey for refill .[name]  

## 2011-09-03 NOTE — Assessment & Plan Note (Signed)
I am very concerned about the state of [name]' diabetes. Her glucose remains high despite doubling her Lantus recently.. Additionally it seems that her NovoLog dose is insufficient for caloric intake.  Her recent weight gain is attributable to increasing insulin allowing storage of calories.  Plan: Emphasize the importance of moderating her food intake.  If she were to able to cut her carbohydrates and half her diabetes would be at goal with the  insulin she's taking now. This is my most important emphasis.  However in the interim plan to increase Lantus to 75 units twice a day.  Additionally increase NovoLog to at least 10 units with every meal.  She will followup with Dr. Raymondo Band in pharmacy clinic for a new sliding scale. Will see me in one month.

## 2011-09-03 NOTE — Progress Notes (Signed)
Mickie Bail presents to clinic today to followup her diabetes and her weight.  Diabetes : In the interim she has been seen by Wyona Almas, who did a one-day food recall. Please refer to that note. In summary Ms. Fini had zero fruit or vegetables and her diet was very high and carbohydrates and fat.  She had 800 calories of sugars sweetened beverages in one day.    Additionally she has increased her insulin to Lantus 60 units twice a day and NovoLog 2-15 units with meals.  She has lost her meter but does note her fasting glucose is always over 250 and usually in the 300s. She has no hypoglycemic episodes. She did not come with either her meter or her glucose log today.   Weight: Please see above with nutrition management.  Since her last visit with me she has gained 10 pounds.  She is not exercising very much at all. However she is in the process of getting a scholarship to the [employer].  Her chronic knee arthritis does limit her ability to exercise.  She estimates that she can walk 10-15 minutes at a time.  Rectal bleeding: In the interim she has been referred to gastroenterology has a colonoscopy scheduled for October 25.  PMH reviewed.  ROS as above otherwise neg Medications reviewed.  Exam:  BP 156/89  Pulse 99  Wt 373 lb (169.192 kg) Gen: Well NAD, morbid obesity HEENT: EOMI,  MMM Lungs: CTABL Nl WOB Heart: RRR no MRG Abd: NABS, NT, ND Exts: Non edematous BL  LE, warm and well perfused.

## 2011-09-03 NOTE — Telephone Encounter (Signed)
[facility], [address] states they have been trying to get in touch with Dr Denyse Amass about needing test strips refilled.

## 2011-09-04 NOTE — Telephone Encounter (Signed)
Wrote rx

## 2011-09-07 ENCOUNTER — Telehealth: Payer: Self-pay | Admitting: Family Medicine

## 2011-09-07 NOTE — Telephone Encounter (Signed)
Pt is needing referral to orthopedic surgeon and diabetic specialist

## 2011-09-08 ENCOUNTER — Ambulatory Visit: Payer: Medicaid Other | Admitting: Family Medicine

## 2011-09-08 NOTE — Telephone Encounter (Signed)
Called [facility] to schedule an appointment for patient and was told that she will need to call their office first. Called and informed patient of this. Also she has upcoming appointment with Dr Raymondo Band for diabetes management on 09/13/11, reminded patient of this appointment and to please keep it.  Waiting for patient to return call after she speaks with [facility].Xylon Croom, Rodena Medin

## 2011-09-08 NOTE — Telephone Encounter (Signed)
Patient returned call and has an appointment with [facility] on 10/07/11 at 2 pm.Busick, Rodena Medin

## 2011-09-13 ENCOUNTER — Ambulatory Visit (INDEPENDENT_AMBULATORY_CARE_PROVIDER_SITE_OTHER): Payer: Medicaid Other | Admitting: Pharmacist

## 2011-09-13 ENCOUNTER — Encounter: Payer: Self-pay | Admitting: Pharmacist

## 2011-09-13 ENCOUNTER — Other Ambulatory Visit: Payer: Self-pay | Admitting: Family Medicine

## 2011-09-13 DIAGNOSIS — IMO0001 Reserved for inherently not codable concepts without codable children: Secondary | ICD-10-CM

## 2011-09-13 DIAGNOSIS — E119 Type 2 diabetes mellitus without complications: Secondary | ICD-10-CM

## 2011-09-13 DIAGNOSIS — E1165 Type 2 diabetes mellitus with hyperglycemia: Secondary | ICD-10-CM

## 2011-09-13 DIAGNOSIS — IMO0002 Reserved for concepts with insufficient information to code with codable children: Secondary | ICD-10-CM

## 2011-09-13 MED ORDER — GLUCOSE BLOOD VI STRP: ORAL_STRIP | Status: DC

## 2011-09-13 NOTE — Progress Notes (Signed)
  Subjective:    Patient ID: [name], female    DOB: 10/11/1976, 35 y.o.   MRN: [id number]  HPI Pt arrives in good spirits, slightly out of breath. History of DM2 for about 12 years. Pt has problems exercising due to her weight. She complains about back and knee pain when she tries to walk. Pt is working on her diet, she is not carb-counting yet. She denies episodes of hypoglycemia.    Review of Systems     Objective:   Physical Exam Home CBG monitor dowloaded. Readings between 200-350.       Assessment & Plan:   Diabetes of 12 yrs duration currently under poor control of blood glucose based on   Lab Results  Component Value Date   HGBA1C 13.0 08/04/2011    ,home fasting CBG readings of 200's and random CBG readings of 300's. Control is suboptimal due to poor diet, lack of exercise and suboptimal prandial insulin. Denies hypoglycemic events.  Able to verbalize appropriate hypoglycemia management plan. Decreased dose of basal insulin Lantus (insulin glargine) to 70 units in the morning and evening. Increased dose of rapid insulin Novolog (insulin aspart) to 20 units before meals and 30 units with a big meal.  Written patient instructions provided.  Follow up Thursday with MD visit and then in  Pharmacist Clinic Visit 14 days.   TTFFC 30 minutes.  Patient seen with Adrian Blackwater, PharmD Candidate and Haynes Hoehn, Pharmacy Resident

## 2011-09-13 NOTE — Assessment & Plan Note (Signed)
Diabetes of 12 yrs duration currently under poor control of blood glucose based on   Lab Results  Component Value Date   HGBA1C 13.0 08/04/2011    ,home fasting CBG readings of 200's and random CBG readings of 300's. Control is suboptimal due to poor diet, lack of exercise and suboptimal prandial insulin. Denies hypoglycemic events.  Able to verbalize appropriate hypoglycemia management plan. Decreased dose of basal insulin Lantus (insulin glargine) to 70 units in the morning and evening. Increased dose of rapid insulin Novolog (insulin aspart) to 20 units before meals and 30 units with a big meal.  Written patient instructions provided.  Follow up Thursday with MD visit and then in  Pharmacist Clinic Visit 14 days.   TTFFC 30 minutes.  Patient seen with Adrian Blackwater, PharmD Candidate and Haynes Hoehn, Pharmacy Resident

## 2011-09-13 NOTE — Progress Notes (Signed)
  Subjective:    Patient ID: [name], female    DOB: May 06, 1976, 35 y.o.   MRN: [id number]  HPI  Reviewed and agree with Dr. Macky Lower management.   Review of Systems     Objective:   Physical Exam        Assessment & Plan:

## 2011-09-13 NOTE — Telephone Encounter (Signed)
Refill request

## 2011-09-13 NOTE — Progress Notes (Signed)
Addended by: Kathrin Ruddy on: 09/13/2011 12:53 PM   Modules accepted: Orders

## 2011-09-13 NOTE — Patient Instructions (Signed)
Change Lantus to 70 units in the morning and 70 units in the evening. Change Novolog to 20 units prior to breakfast, 20 units prior to lunch if eating lunch, 20 units prior to dinner if your premeal sugar is <200 and you plan to eat more than 2 'handfuls' of servings of carbohydrates take 30 units. By Friday turn in paper work at [employer].

## 2011-09-13 NOTE — Assessment & Plan Note (Signed)
>>  ASSESSMENT AND PLAN FOR INSULIN DEPENDENT TYPE 2 DIABETES MELLITUS ([name]) WRITTEN ON 09/13/2011 12:31 PM BY KOVAL, PETER G, PHARMD  Diabetes of 12 yrs duration currently under poor control of blood glucose based on   Lab Results  Component Value Date   HGBA1C 13.0 08/04/2011    ,home fasting CBG readings of 200's and random CBG readings of 300's. Control is suboptimal due to poor diet, lack of exercise and suboptimal prandial insulin. Denies hypoglycemic events.  Able to verbalize appropriate hypoglycemia management plan. Decreased dose of basal insulin Lantus (insulin glargine) to 70 units in the morning and evening. Increased dose of rapid insulin Novolog (insulin aspart) to 20 units before meals and 30 units with a big meal.  Written patient instructions provided.  Follow up Thursday with MD visit and then in  Pharmacist Clinic Visit 14 days.   TTFFC 30 minutes.  Patient seen with Adrian Blackwater, PharmD Candidate and Haynes Hoehn, Pharmacy Resident

## 2011-09-14 LAB — CULTURE, ROUTINE-ABSCESS

## 2011-09-14 LAB — HERPES SIMPLEX VIRUS CULTURE

## 2011-09-15 ENCOUNTER — Telehealth: Payer: Self-pay | Admitting: Family Medicine

## 2011-09-15 NOTE — Telephone Encounter (Signed)
Send refill to [facility].

## 2011-09-16 ENCOUNTER — Ambulatory Visit (INDEPENDENT_AMBULATORY_CARE_PROVIDER_SITE_OTHER): Payer: Medicaid Other | Admitting: Family Medicine

## 2011-09-16 ENCOUNTER — Encounter: Payer: Self-pay | Admitting: Family Medicine

## 2011-09-16 DIAGNOSIS — E119 Type 2 diabetes mellitus without complications: Secondary | ICD-10-CM

## 2011-09-16 DIAGNOSIS — IMO0002 Reserved for concepts with insufficient information to code with codable children: Secondary | ICD-10-CM

## 2011-09-16 DIAGNOSIS — E1165 Type 2 diabetes mellitus with hyperglycemia: Secondary | ICD-10-CM

## 2011-09-16 DIAGNOSIS — IMO0001 Reserved for inherently not codable concepts without codable children: Secondary | ICD-10-CM

## 2011-09-16 LAB — DIFFERENTIAL
Basophils Absolute: 0
Lymphocytes Relative: 38
Neutro Abs: 4.9

## 2011-09-16 LAB — I-STAT 8, (EC8 V) (CONVERTED LAB)
BUN: 10
Chloride: 104
Glucose, Bld: 168 — ABNORMAL HIGH
pCO2, Ven: 56.4 — ABNORMAL HIGH
pH, Ven: 7.342 — ABNORMAL HIGH

## 2011-09-16 LAB — D-DIMER, QUANTITATIVE: D-Dimer, Quant: <0.22

## 2011-09-16 LAB — POCT CARDIAC MARKERS
Myoglobin, poc: 39.7
Operator id: 285491
Troponin i, poc: <0.05

## 2011-09-16 LAB — CBC
Hemoglobin: 12.7
Platelets: 305
RDW: 13.3

## 2011-09-16 MED ORDER — GLUCOSE BLOOD VI STRP: 1.0000 | ORAL_STRIP | Freq: Three times a day (TID) | Status: DC

## 2011-09-16 NOTE — Progress Notes (Signed)
[name] comes to clinic today to follow up her weight management regarding her diabetes morbid obesity.   Wt Readings from Last 3 Encounters:  09/16/11 378 lb (171.46 kg)  09/13/11 378 lb 11.2 oz (171.777 kg)  09/03/11 373 lb (169.192 kg)   Belinda Ancheta thinks that things have not gone well in the last month.  She has tried to reduce sugar intake and switching to diet drinks. Goals is try to portion out foods. Has been to Dr. Raymondo Band who increased Novolog and reduced lantus. AM glucoses are mostly 220s now vs > 300.   Exercise: Trying to walk. Can walk even a small distance but notes increased knee pain and back pain. Has submitted scholarship for the [employer].   24-hr recall Awoke 10:30am: Ate 2 grilled cheese and [place] sandwiches (white bread, american cheese 1 slice per sandwich and deli [place] breast).  Drank coffee with sugar (2 spoons) and creamer and water. Skipped Lunch:  Drank water Dinner: 645pm: Hamburger helper with lima beans.  2.5 cups hamburger helper with 1.5 cups canned lima beans.   Drank Sweet tea 16 oz - [filled up a whole 32 oz cup] After Dinner: Hamburger helper 1.5 cups. Water Bed at 1am (sweet tea now 2cups per gallon of sugar)  Estimated calories from SSB: 1/2 cup of sugar total = 96g sugar = around 400 calories

## 2011-09-16 NOTE — Telephone Encounter (Signed)
Will see patient in nutrition clinic

## 2011-09-16 NOTE — Assessment & Plan Note (Signed)
Somewhat improved since the last visit.  Weight is stable. I continue to think that her weight gain is due to increased storage of consumed calories with adequate insulin.  Plan: Extensive discussion of diet and exercise modification with 40 mins of face to face time.  Discussed  1) Eating 3 meals and 2 snacks per day.  2) Portion control with specific examples -- reccs for no more than 2 starch portions per meal 3) Meal planning -- including breakfast and lunch 4) Exercise (even just getting up off the couch) 5) Adding vegetables and fruit to every meal and snacks.   Will follow up in 3 weeks.

## 2011-09-16 NOTE — Assessment & Plan Note (Signed)
Improved with reduction in carbs especially SSB and increasing novolog dose.  Plan: Conitnue diet modification noted in obesity section and following new novolog scale.

## 2011-09-16 NOTE — Patient Instructions (Addendum)
Kanylah's Goals: 1) Switch to sweet and low at the next grocery shop.  2) Plan your meals ahead of time. Even write it down.  Come up with some specific meals before you go shopping. Include Breakfast, Lunch and [employer].  3) Exercise: Work on getting that scholarship to [employer].  In the mean time every commercial walk to the bathroom or the bedroom, but not the kitchen. Or just stand up and sit down during the commercials.  4) Portion Control: 1 full cup of starchy food per meal. Add veggies to the starch with every meal.      Herron Fero's Goals: 1) Frozen veggies

## 2011-09-16 NOTE — Assessment & Plan Note (Signed)
>>  ASSESSMENT AND PLAN FOR INSULIN DEPENDENT TYPE 2 DIABETES MELLITUS ([name]) WRITTEN ON 09/16/2011  4:41 PM BY COREY, EVAN S, MD  Improved with reduction in carbs especially SSB and increasing novolog dose.  Plan: Conitnue diet modification noted in obesity section and following new novolog scale.

## 2011-09-17 LAB — URINALYSIS, ROUTINE W REFLEX MICROSCOPIC
Bilirubin Urine: NEGATIVE
Ketones, ur: NEGATIVE
Leukocytes, UA: NEGATIVE
Nitrite: NEGATIVE
Protein, ur: >300 — AB

## 2011-09-17 LAB — POCT PREGNANCY, URINE: Preg Test, Ur: NEGATIVE

## 2011-09-21 ENCOUNTER — Other Ambulatory Visit: Payer: Self-pay | Admitting: Family Medicine

## 2011-09-21 MED ORDER — ZOLPIDEM TARTRATE 5 MG PO TABS: 5.0000 mg | ORAL_TABLET | Freq: Every evening | ORAL | Status: DC | PRN

## 2011-09-28 ENCOUNTER — Other Ambulatory Visit (HOSPITAL_COMMUNITY): Payer: Medicaid Other

## 2011-09-30 ENCOUNTER — Ambulatory Visit: Payer: Medicaid Other | Admitting: Family Medicine

## 2011-09-30 ENCOUNTER — Ambulatory Visit (HOSPITAL_COMMUNITY): Admission: RE | Admit: 2011-09-30 | Payer: Medicaid Other | Source: Ambulatory Visit | Admitting: Gastroenterology

## 2011-10-06 ENCOUNTER — Ambulatory Visit: Payer: Medicaid Other | Admitting: Family Medicine

## 2011-10-16 IMAGING — CR DG CHEST 2V
2 series · 2 of 2 positions shown · non-contrast
Comparison: 07/01/2010

CLINICAL DATA: Hyperglycemia

CHEST - 2 VIEW

[w chest pa]
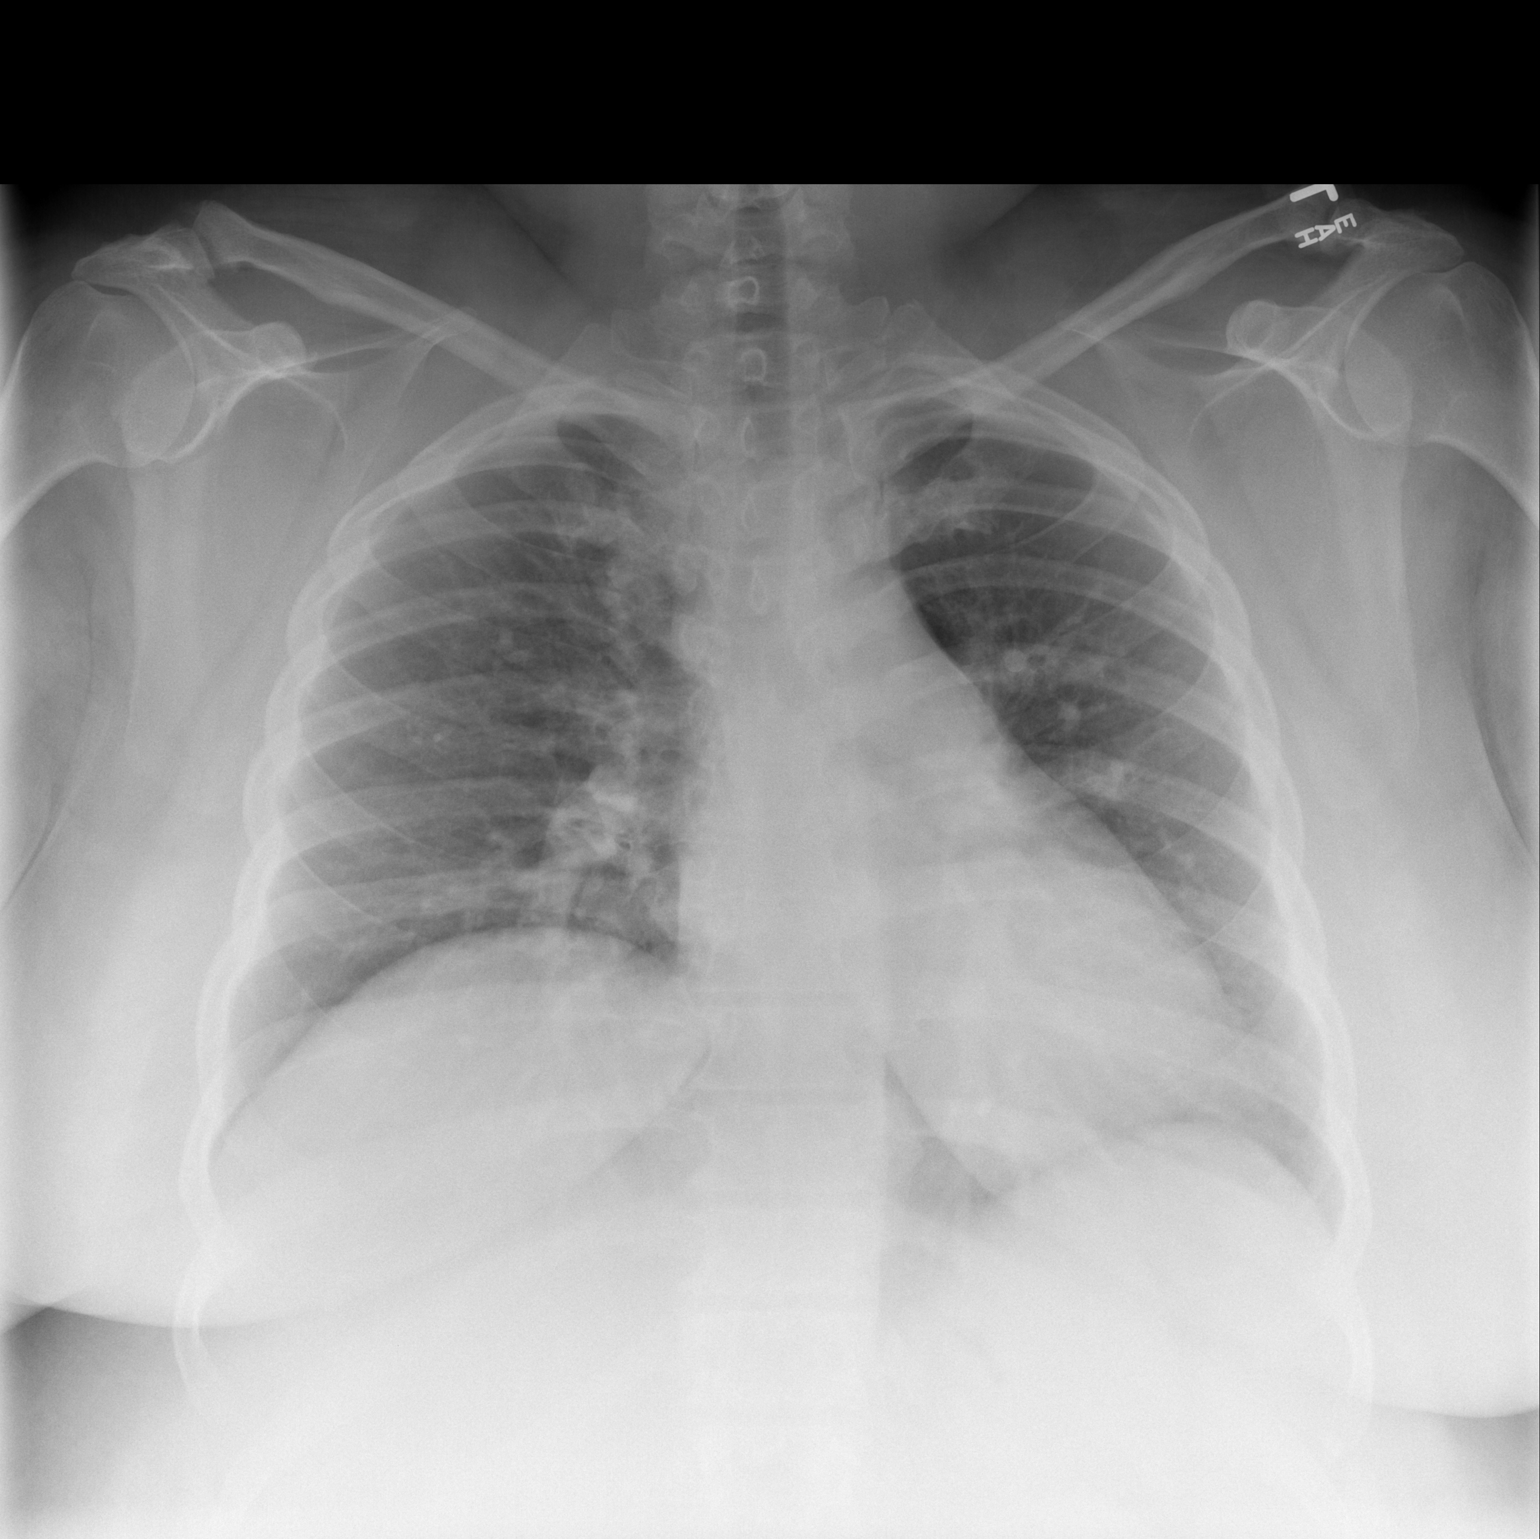

[w chest lat *]
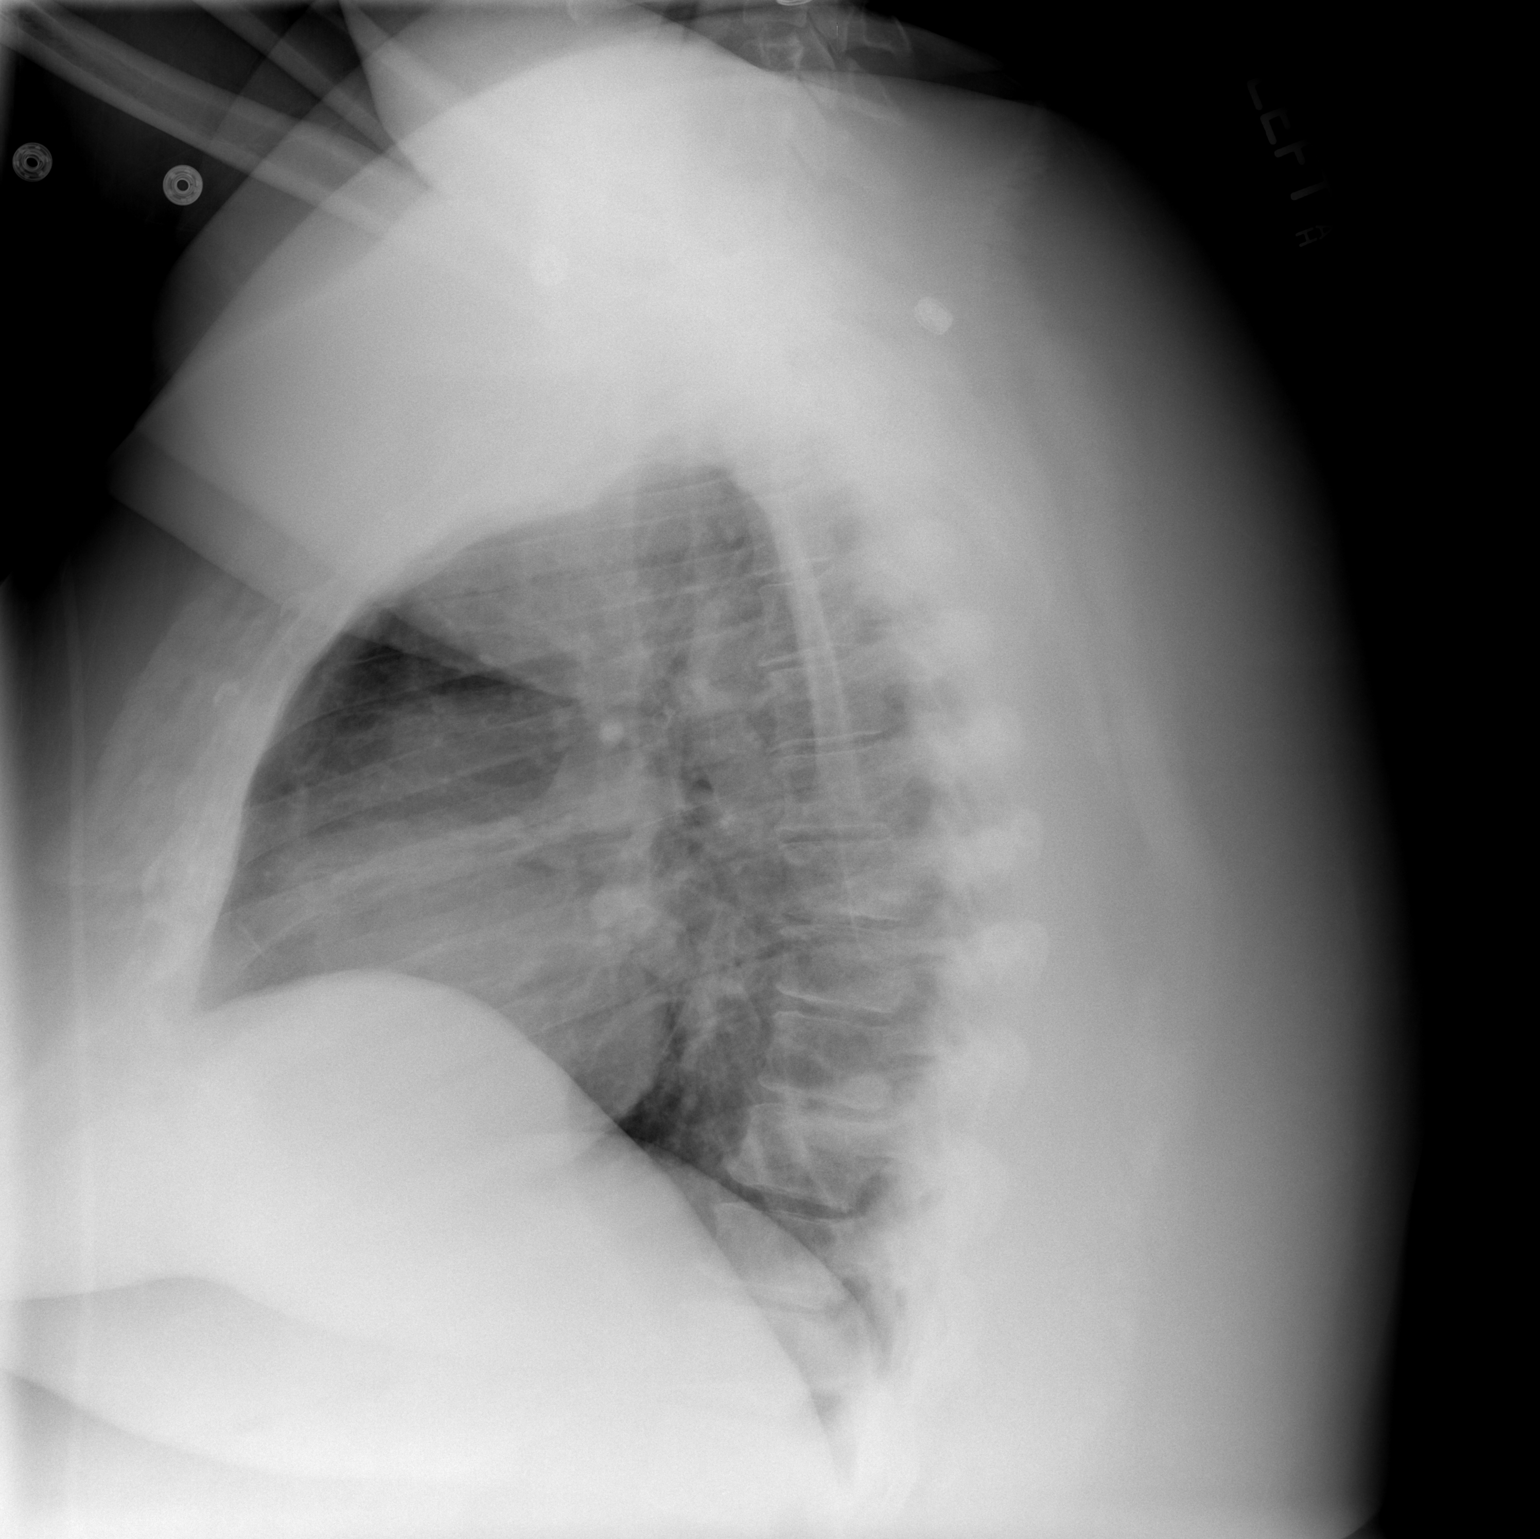

[2 of 2 positions shown; findings below may reference images not displayed]

FINDINGS: Borderline enlargement of cardiac silhouette.
Normal mediastinal contours and pulmonary vascularity.
Peribronchial thickening and chronic elevation of right diaphragm.
No infiltrate or effusion.
Osseous structures unremarkable.
IMPRESSION: Minimal bronchitic changes.

## 2011-10-18 ENCOUNTER — Encounter (HOSPITAL_COMMUNITY): Payer: Self-pay | Admitting: *Deleted

## 2011-10-18 ENCOUNTER — Emergency Department (HOSPITAL_COMMUNITY)
Admission: EM | Admit: 2011-10-18 | Discharge: 2011-10-18 | Disposition: A | Discharge status: Home / Self Care | Payer: Medicaid Other | Attending: Emergency Medicine | Admitting: Emergency Medicine

## 2011-10-18 ENCOUNTER — Telehealth: Payer: Self-pay | Admitting: Family Medicine

## 2011-10-18 DIAGNOSIS — Z79899 Other long term (current) drug therapy: Secondary | ICD-10-CM | POA: Insufficient documentation

## 2011-10-18 DIAGNOSIS — M545 Low back pain, unspecified: Secondary | ICD-10-CM

## 2011-10-18 MED ORDER — DIAZEPAM 5 MG PO TABS: 5.0000 mg | ORAL_TABLET | Freq: Three times a day (TID) | ORAL | Status: AC | PRN

## 2011-10-18 MED ORDER — HYDROCODONE-ACETAMINOPHEN 5-325 MG PO TABS: 1.0000 | ORAL_TABLET | Freq: Four times a day (QID) | ORAL | Status: AC | PRN

## 2011-10-18 MED ORDER — OXYCODONE-ACETAMINOPHEN 5-325 MG PO TABS
1.0000 | ORAL_TABLET | Freq: Once | ORAL | Status: AC
  Administered 2011-10-18: 1 via ORAL
  Filled 2011-10-18: qty 1

## 2011-10-18 NOTE — ED Provider Notes (Signed)
History     CSN: [id number] Arrival date & time: 10/18/2011  9:43 PM   First MD Initiated Contact with Patient 10/18/11 2228      Chief Complaint  Patient presents with  . Back Pain    (Consider location/radiation/quality/duration/timing/severity/associated sxs/prior treatment) HPI Patient presents to the emergency department with low back pain for the past day.  She states that she has chronic back pain, but this is worse than normal.  She denies numbness, weakness, nausea, vomiting, diarrhea, bowel or bladder incontinence, and no urinary symptoms.  She, states it is in the middle of her lower back.  Patient also denies fevers. Past Medical History  Diagnosis Date  . Morbid obesity   . Extreme obesity with alveolar hypoventilation     History reviewed. No pertinent past surgical history.  History reviewed. No pertinent family history.  History  Substance Use Topics  . Smoking status: Never Smoker   . Smokeless tobacco: Never Used  . Alcohol Use: No    OB History    Grav Para Term Preterm Abortions TAB SAB Ect Mult Living                  Review of Systems  All other systems reviewed and are negative.    Allergies  Nubain  Home Medications   Current Outpatient Rx  Name Route Sig Dispense Refill  . BUDESONIDE-FORMOTEROL FUMARATE 160-4.5 MCG/ACT IN AERO Inhalation Inhale 2 puffs into the lungs 2 (two) times daily. 1 Inhaler 12  . CARVEDILOL 6.25 MG PO TABS Oral Take 6.25 mg by mouth 2 (two) times daily with meals.      Marland Kitchen CETIRIZINE HCL 10 MG PO TABS  TAKE 1 TABLET BY MOUTH DAILY 30 tablet PRN  . GLIMEPIRIDE 4 MG PO TABS  TAKE 1 TABLET BY MOUTH EVERY DAY 30 tablet PRN  . INSULIN ASPART 100 UNIT/ML [place] SOLN       . INSULIN GLARGINE 100 UNIT/ML [place] SOLN Subcutaneous Inject 70 Units into the skin 2 (two) times daily.      Marland Kitchen LISINOPRIL 10 MG PO TABS  TAKE 1 TABLET BY MOUTH EVERY DAY 30 tablet PRN  . MELOXICAM 15 MG PO TABS Oral Take 1 tablet (15 mg total) by mouth  daily. 30 tablet 0  . NEXIUM 40 MG PO CPDR  TAKE 1 CAPSULE BY MOUTH EVERY MORNING 30 capsule PRN  . NYSTATIN 100000 UNIT/GM EX POWD Topical Apply topically daily.      . SERTRALINE HCL 50 MG PO TABS Oral Take 50 mg by mouth daily. Per [facility]      . VENTOLIN HFA 108 (90 BASE) MCG/ACT IN AERS  INHALE 2 PUFFS BY MOUTH EVERY 4 HOURS AS NEEDED FOR SHORTNESS OF BREATH 1 Inhaler 3  . ZOLPIDEM TARTRATE 5 MG PO TABS Oral Take 1 tablet (5 mg total) by mouth at bedtime as needed. 90 tablet 0  . TRAMADOL HCL 50 MG PO TABS Oral Take 50 mg by mouth 2 (two) times daily as needed. For breakthru pain       BP 150/86  Pulse 97  Temp(Src) 99.1 F (37.3 C) (Oral)  Resp 20  SpO2 94%  Physical Exam  Constitutional: She is oriented to person, place, and time. She appears well-developed and well-nourished.  HENT:  Head: Normocephalic and atraumatic.  Cardiovascular: Normal rate and regular rhythm.   Pulmonary/Chest: Effort normal and breath sounds normal.  Musculoskeletal:       Lumbar back: She exhibits tenderness.  She exhibits no bony tenderness, no deformity and no spasm.       Back:  Neurological: She is alert and oriented to person, place, and time. She has normal strength and normal reflexes. No sensory deficit. Coordination and gait normal.  Reflex Scores:      Patellar reflexes are 2+ on the right side and 2+ on the left side.      Achilles reflexes are 2+ on the right side and 2+ on the left side. Skin: No rash noted.    ED Course  Procedures (including critical care time)          MDM  The patient is a morbidly, obese female, who has chronic lower back pain.  Patient has no injuries noted on Exam and she denies trauma.  Patient does not have any signs of infectious process that would be leading to her back pain.  Shows a normal neurological and sensory function in the lower extremities.  She has normal deep tendon reflexes of her lower extremities.  Have her follow up with her  doctor for recheck.  No x-rays were done here tonight.  Based on the fact, that there is no trauma and no new symptoms.  Other than increased pain       [name]-[place], [place] 10/18/11 2306

## 2011-10-18 NOTE — ED Notes (Signed)
Pt in c/o lower back pain, states pain is increased with movement, denies urinary symptoms, denies injury

## 2011-10-18 NOTE — Telephone Encounter (Signed)
Patient complaining of persistent back pain that started yesterday.  Eased off last night but worse this morning and has gotten worse throughout day.  Took ibuprofen for pain about 5 hours ago. Helped pain some but now back..  Pain worsened by movement.  ROS: denies numbness or urine/bowel incontinence, fevers, chills.   Patient reports she is at ED right now. Asking whether she should continue to wait or come in to clinic tomorrow morning.   I advised that if she was in significant pain, she continue to wait. But if she could wait until tomorrow and since she was not having any neurological red flags, she take tylenol/ibuprofen and come in tomorrow to be seen. She has a history of chronic back pain but says this is different from her usual back pain.

## 2011-10-19 ENCOUNTER — Other Ambulatory Visit: Payer: Self-pay | Admitting: Family Medicine

## 2011-10-19 ENCOUNTER — Ambulatory Visit (INDEPENDENT_AMBULATORY_CARE_PROVIDER_SITE_OTHER): Payer: Medicaid Other | Admitting: Family Medicine

## 2011-10-19 DIAGNOSIS — M545 Low back pain, unspecified: Secondary | ICD-10-CM | POA: Insufficient documentation

## 2011-10-19 MED ORDER — DICLOFENAC SODIUM 1 % TD GEL: 1.0000 "application " | Freq: Four times a day (QID) | TRANSDERMAL | Status: DC

## 2011-10-19 NOTE — Progress Notes (Signed)
  Subjective:    Patient ID: [name], female    DOB: 1976/02/07, 35 y.o.   MRN: [id number]  HPI  Patient presents as a work in for low back pain. Symptoms started 3 days ago. Patient states that she was sitting on her couch, and when she stood up to use the bathroom she developed a sharp pain in her lower back. She denies any trauma or injury. She denies lifting any heavy objects. Pain is located at mid lower back, does not radiate. She has taken over-the-counter ibuprofen which provides no relief. She continues to take Mobic every day. She has also taken tramadol once a day with no relief. Yesterday, pain was so severe that she went to the emergency department. Patient states that ED physician diagnosed her with a muscle strain. She was given one Percocet and discharged home.   Review of Systems  Denies any fevers, chills, night sweats, nausea or vomiting. Denies any urinary or bowel incontinence. Denies any numbness or tingling of the lower extremities.    Objective:   Physical Exam  Constitutional: No distress.  Cardiovascular: Normal rate, regular rhythm and normal heart sounds.  Exam reveals no gallop and no friction rub.   No murmur heard. Pulmonary/Chest: Effort normal and breath sounds normal. She has no wheezes. She has no rales.  Musculoskeletal:       Mild pain on palpation of lumbar paraspinal muscles.  No bony tenderness or misalignment.  No erythema.  Patient able to flex and extend and rotate lower back without difficulty.   Skin: Skin is warm and dry. No erythema.          Assessment & Plan:

## 2011-10-19 NOTE — Telephone Encounter (Signed)
Refill request

## 2011-10-19 NOTE — Assessment & Plan Note (Signed)
Acute low back pain, likely secondary to body habitus versus muscle strain.  Patient is morbidly obese - discussed weight loss, exercise to avoid worsening low back pain. Patient told me that she was given one Percocet in the ED and no medications at discharge, but after looking at her chart, I noticed that she was given 15 tablets of Percocet yesterday. I will not give any narcotics today. I gave a prescription for Voltaren gel to be used in addition to oral medication. Advised patient to use heating pads or ice packs to the affected area 3 times a day as needed for pain. Followup with her PCP or go to  emergency department if pain becomes more severe.

## 2011-10-19 NOTE — Patient Instructions (Signed)
It was nice to meet you. I will send Voltaren gel to your pharmacy - use as directed. Apply ice packs or heating pads to low back three times a day. If pain persists or you develop fever, chills, nausea vomiting, please return to clinic. Please schedule a follow up appointment with Dr. Denyse Amass for further management.

## 2011-10-19 NOTE — ED Provider Notes (Signed)
Medical screening examination/treatment/procedure(s) were performed by non-physician practitioner and as supervising physician I was immediately available for consultation/collaboration.    Aundra Pung R Khristina Janota, MD 10/19/11 0000 

## 2011-10-27 ENCOUNTER — Encounter (HOSPITAL_COMMUNITY): Payer: Self-pay | Admitting: *Deleted

## 2011-11-02 ENCOUNTER — Encounter (HOSPITAL_COMMUNITY): Payer: Self-pay | Admitting: Anesthesiology

## 2011-11-02 ENCOUNTER — Encounter (HOSPITAL_COMMUNITY): Admission: RE | Payer: Self-pay | Source: Ambulatory Visit

## 2011-11-02 ENCOUNTER — Ambulatory Visit (HOSPITAL_COMMUNITY): Admission: RE | Admit: 2011-11-02 | Payer: Medicaid Other | Source: Ambulatory Visit | Admitting: Gastroenterology

## 2011-11-02 HISTORY — DX: Chondrocostal junction syndrome (tietze): M94.0

## 2011-11-02 HISTORY — DX: Anal fissure, unspecified: K60.2

## 2011-11-02 HISTORY — DX: Cellulitis, unspecified: L03.90

## 2011-11-02 HISTORY — DX: Gastro-esophageal reflux disease without esophagitis: K21.9

## 2011-11-02 HISTORY — DX: Essential (primary) hypertension: I10

## 2011-11-02 HISTORY — DX: Other abnormalities of breathing: R06.89

## 2011-11-02 HISTORY — DX: Depression, unspecified: F32.A

## 2011-11-02 HISTORY — DX: Major depressive disorder, single episode, unspecified: F32.9

## 2011-11-02 SURGERY — COLONOSCOPY WITH PROPOFOL
Anesthesia: Monitor Anesthesia Care

## 2011-11-02 NOTE — Anesthesia Preprocedure Evaluation (Deleted)
Anesthesia Evaluation  Patient identified by MRN, date of birth, ID band Patient awake    Reviewed: Allergy & Precautions, H&P , NPO status , Patient's Chart, lab work & pertinent test results  Airway Mallampati: III TM Distance: <3 FB     Dental   Pulmonary neg pulmonary ROS, asthma , sleep apnea ,    + decreased breath sounds      Cardiovascular hypertension, neg cardio ROS Regular     Neuro/Psych Negative Neurological ROS  Negative Psych ROS   GI/Hepatic negative GI ROS, Neg liver ROS, GERD-  ,  Endo/Other  Negative Endocrine ROSDiabetes mellitus-Morbid obesity  Renal/GU negative Renal ROS  Genitourinary negative   Musculoskeletal negative musculoskeletal ROS (+)   Abdominal (+) obese,   Peds negative pediatric ROS (+)  Hematology negative hematology ROS (+)   Anesthesia Other Findings   Reproductive/Obstetrics negative OB ROS                           Anesthesia Physical Anesthesia Plan  ASA: III  Anesthesia Plan: MAC   Post-op Pain Management:    Induction: Intravenous  Airway Management Planned: Simple Face Mask  Additional Equipment:   Intra-op Plan:   Post-operative Plan:   Informed Consent: I have reviewed the patients History and Physical, chart, labs and discussed the procedure including the risks, benefits and alternatives for the proposed anesthesia with the patient or authorized representative who has indicated his/her understanding and acceptance.   Dental advisory given  Plan Discussed with: CRNA  Anesthesia Plan Comments:         Anesthesia Quick Evaluation

## 2011-11-04 ENCOUNTER — Telehealth: Payer: Self-pay | Admitting: Family Medicine

## 2011-11-04 ENCOUNTER — Ambulatory Visit (INDEPENDENT_AMBULATORY_CARE_PROVIDER_SITE_OTHER): Payer: Medicaid Other | Admitting: Family Medicine

## 2011-11-04 VITALS — BP 128/88 | HR 107 | Temp 97.9°F | Ht 62.0 in | Wt 373.2 lb

## 2011-11-04 DIAGNOSIS — L0291 Cutaneous abscess, unspecified: Secondary | ICD-10-CM | POA: Insufficient documentation

## 2011-11-04 MED ORDER — DOXYCYCLINE HYCLATE 100 MG PO TABS: 100.0000 mg | ORAL_TABLET | Freq: Two times a day (BID) | ORAL | Status: DC

## 2011-11-04 MED ORDER — DOXYCYCLINE HYCLATE 100 MG PO TABS: 100.0000 mg | ORAL_TABLET | Freq: Two times a day (BID) | ORAL | Status: AC

## 2011-11-04 NOTE — Patient Instructions (Signed)
Incision and Drainage of Abscess An abscess (boil or furuncle) is an area infected by germs that contains a collection of pus. Signs and problems (symptoms) of an abscess include pain, tenderness, redness, or hardness. You may feel a moveable, soft area under your skin. An abscess can occur anywhere in the body. Occasionally, this may spread to surrounding tissues causing cellulitis. Sometimes, a surgeon may make a cut (incision) over your abscess. The pus is drained. Gauze may be packed into the space to provide a drain. Keeping a drain or piece of gauze in the incision keeps the skin from healing first. This helps stop the abscess from forming again. The area may be painful for 5 to 7 days. Most people with an abscess do not have high fevers. If seen early, your abscess may not have localized and may not be cut. If it does not get better on its own or with medicines, you may require another appointment. HOME CARE INSTRUCTIONS   Only take over-the-counter or prescription medicines for pain, discomfort, or fever as directed by your caregiver. Use these only if your caregiver has not given medicines that would interfere.   When you bathe, remove the gauze drain after soaking. You may then wash the wound gently with mild, soapy water. Repack with gauze as your caregiver directs.   See your caregiver as directed for a recheck.   If antibiotics were prescribed, take them as directed.  SEEK MEDICAL CARE IF:   You develop increased pain, swelling, redness, drainage, or bleeding in the wound site.   You develop signs of generalized infection, including muscle aches, chills, or a general ill feeling.   You or your child has an oral temperature above 102 F (38.9 C).  MAKE SURE YOU:   Understand these instructions.   Will watch your condition.   Will get help right away if you are not doing well or get worse.  Document Released: 05/18/2001 Document Revised: 08/04/2011 Document Reviewed:  07/12/2008 [employer] Patient Information 2012 [employer]. 

## 2011-11-04 NOTE — Telephone Encounter (Signed)
[name] was in earlier and has been waiting for her Rx for the antibiotic to be sent to [facility], but they have not received it yet.

## 2011-11-04 NOTE — Progress Notes (Signed)
  Subjective:    Patient ID: [name], female    DOB: 06-07-1976, 35 y.o.   MRN: [id number]  HPI R medical buttock/intratrigal fold abscess x 2 weeks. Pt states that area has progressively swollen over this same time period. Pt states that area began draining blood and pus 2 days ago. Area stopped draining overnight. 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  No systemic sxs including fever, chills, nausea, vomiting.    Review of Systems See HPI    Objective:   Physical Exam Gen: morbidly obese Skin:      Assessment & Plan:

## 2011-11-04 NOTE — Assessment & Plan Note (Signed)
I and D at bedside. Consent obtained prior to procedure. Area cleaned in sterile fashion. Peripheral area of abscess anesthetized with lidocaine with epinephrine. Area incised and drained. Packing with iodoform gauze. Doxycycline x 10 days for secondary bacterial/soft tissue coverage.  Pt is noted to have secondary abscess on medial R  thigh that pt refused to have drained. Discused infectious red flags. Pt instructed to follow up in am.

## 2011-11-04 NOTE — Telephone Encounter (Signed)
At the pharmacy. Lorenda Hatchet, Renato Battles

## 2011-11-05 ENCOUNTER — Encounter: Payer: Self-pay | Admitting: Family Medicine

## 2011-11-11 ENCOUNTER — Other Ambulatory Visit: Payer: Self-pay | Admitting: Family Medicine

## 2011-11-11 NOTE — Telephone Encounter (Signed)
Refill request

## 2011-11-19 ENCOUNTER — Telehealth: Payer: Self-pay | Admitting: Family Medicine

## 2011-11-19 MED ORDER — PANTOPRAZOLE SODIUM 40 MG PO TBEC: 40.0000 mg | DELAYED_RELEASE_TABLET | Freq: Every day | ORAL | Status: DC

## 2011-11-19 NOTE — Telephone Encounter (Signed)
Will have to call pt and let her know of the change from nexium to protonix

## 2011-11-26 IMAGING — CT CT ANGIO CHEST
2 of 12 series · 17 of 38 positions shown · IV contrast (agent unspecified)
Comparison: 10/17/2010; 12/24/2006

CLINICAL DATA: Shortness of breath for several days.

CT ANGIOGRAPHY CHEST WITH CONTRAST
TECHNIQUE: Multidetector CT imaging of the chest was performed
using the standard protocol during bolus administration of
intravenous contrast.  Multiplanar CT image reconstructions
including MIPs were obtained to evaluate the vascular anatomy.
Contrast:  100 ml [id number]

[Series 6: thins · axial · 0.70mm/px · z∈[+1166,+1280]mm · 7 of 154 slices shown (1 of 2)]
[im 20/154  lung]
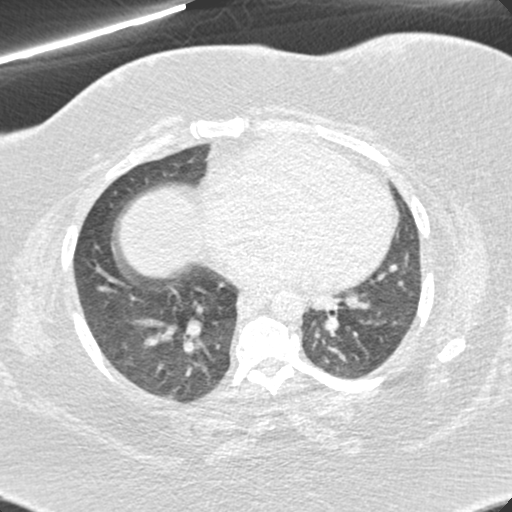
[im 39/154  lung]
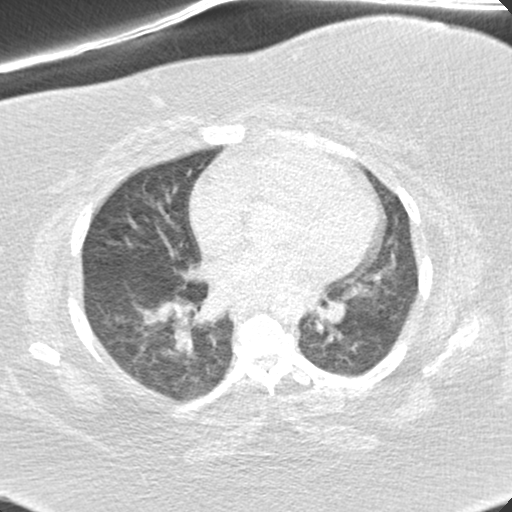
[im 58/154  lung]
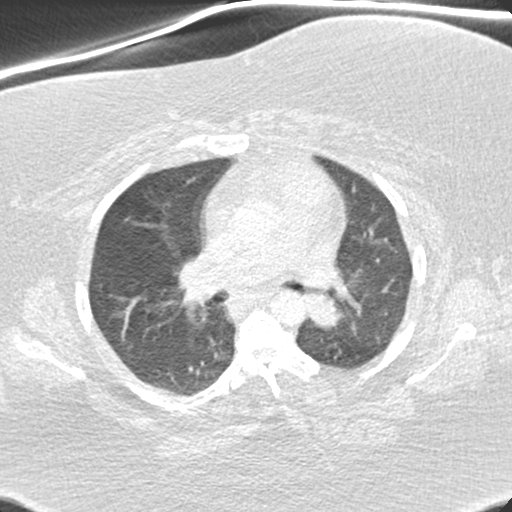
[im 77/154  lung]
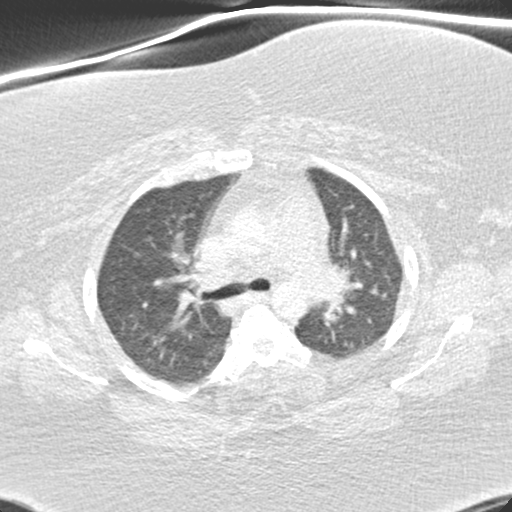
[im 96/154  lung]
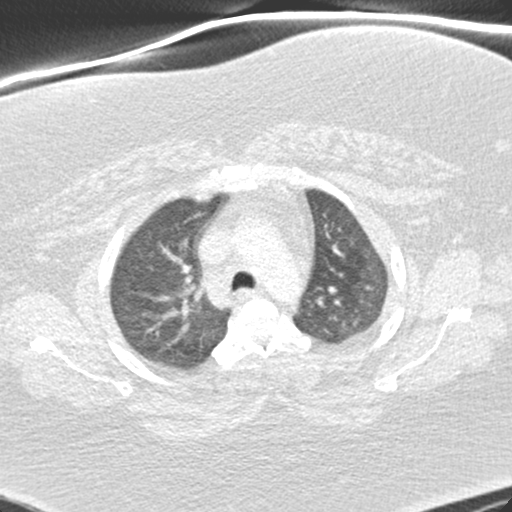
[im 115/154  lung]
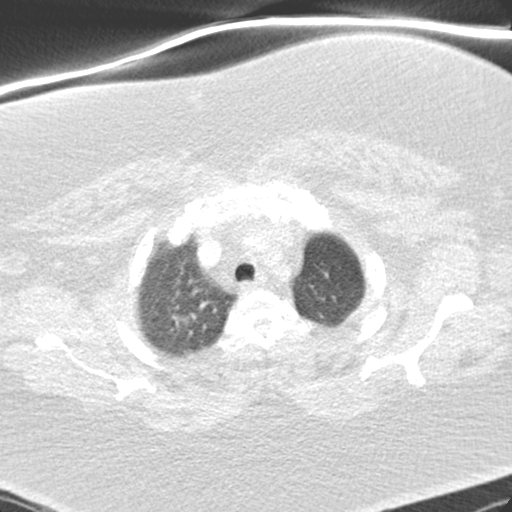
[im 134/154  lung]
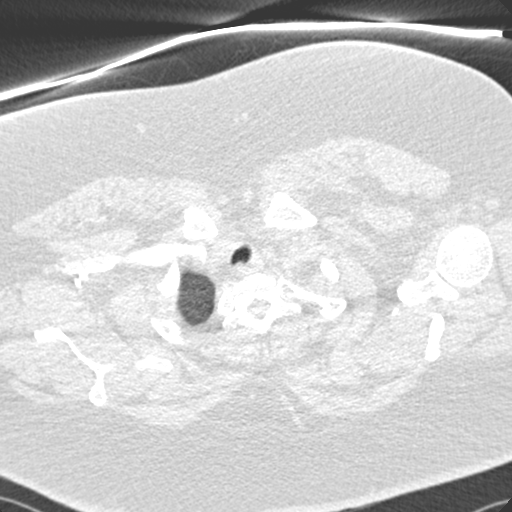

[Series 12: thins · axial · 0.70mm/px · z∈[+1094,+1258]mm · 10 of 202 slices shown (2 of 2)]
[im 19/202  lung]
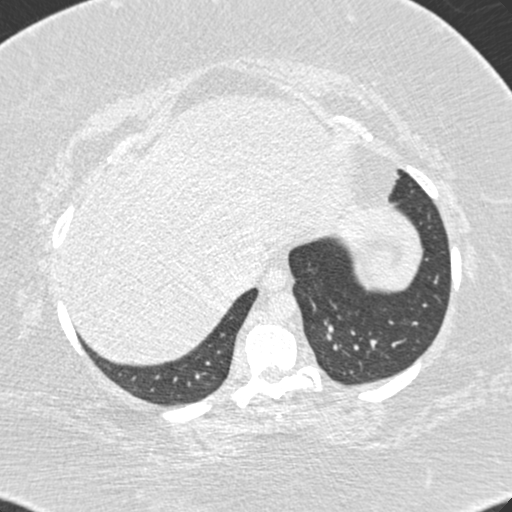
[im 37/202  mediastinal]
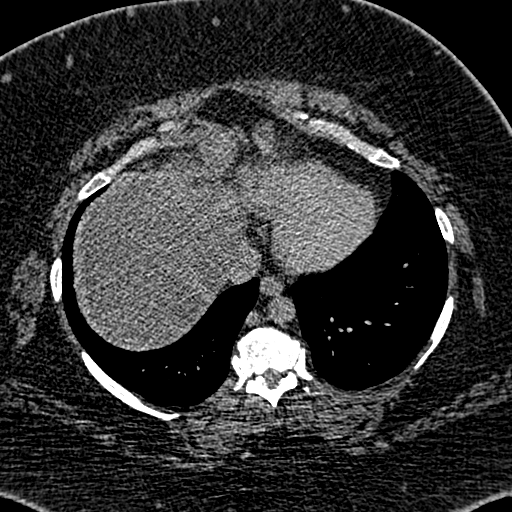
[im 55/202  lung]
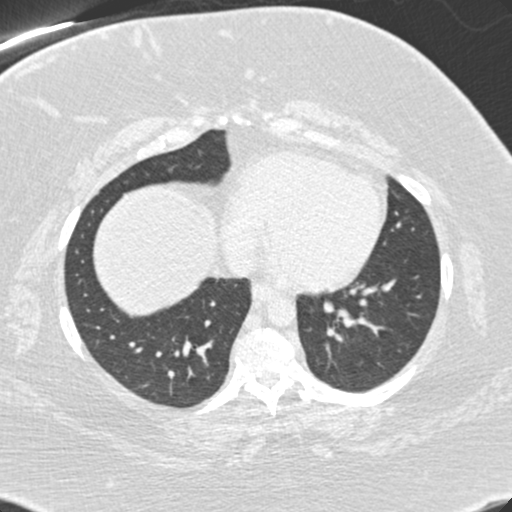
[im 74/202  mediastinal]
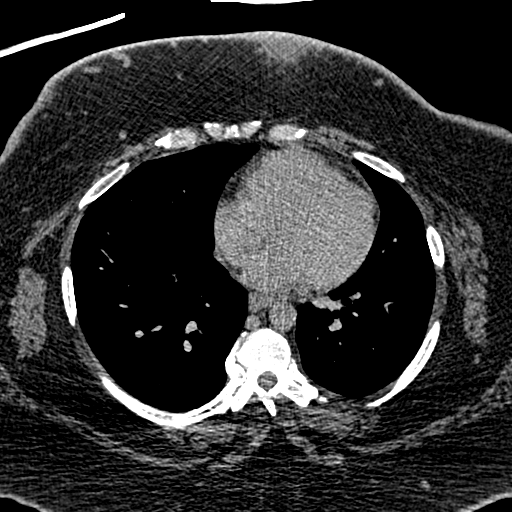
[im 92/202  lung]
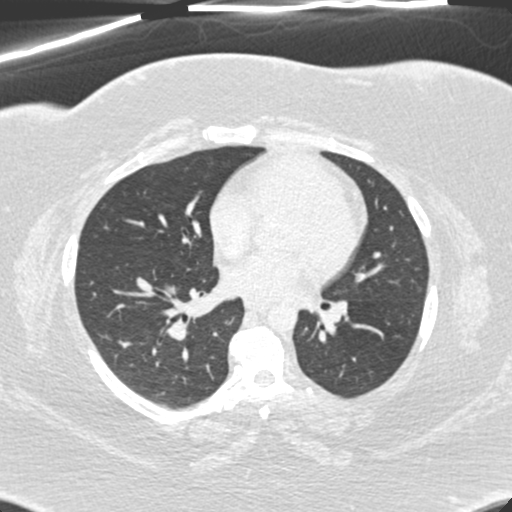
[im 110/202  mediastinal]
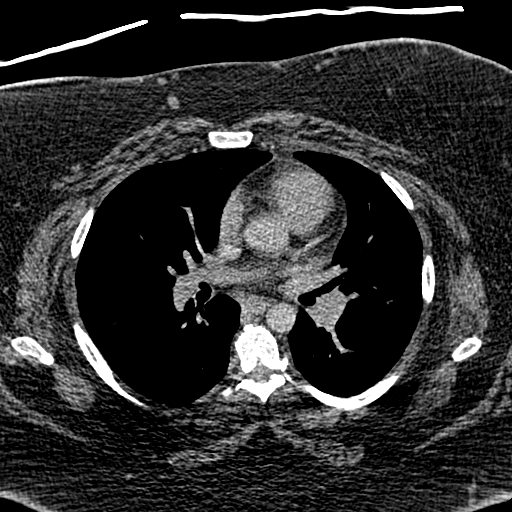
[im 128/202  lung]
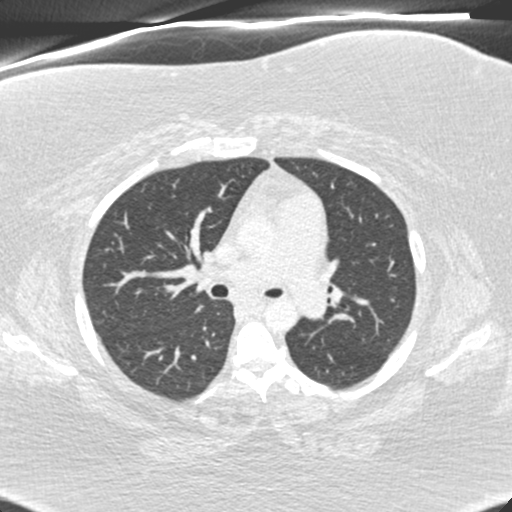
[im 147/202  mediastinal]
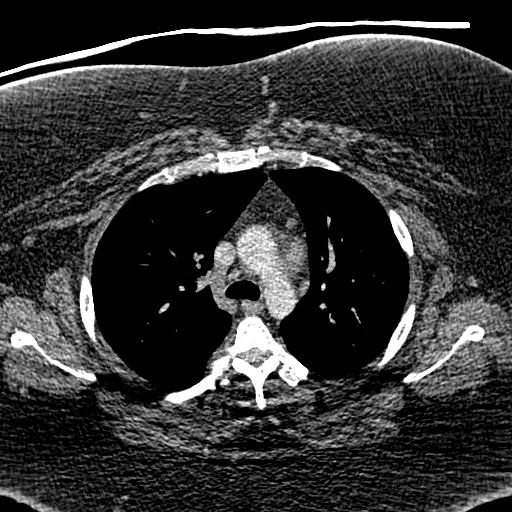
[im 165/202  lung]
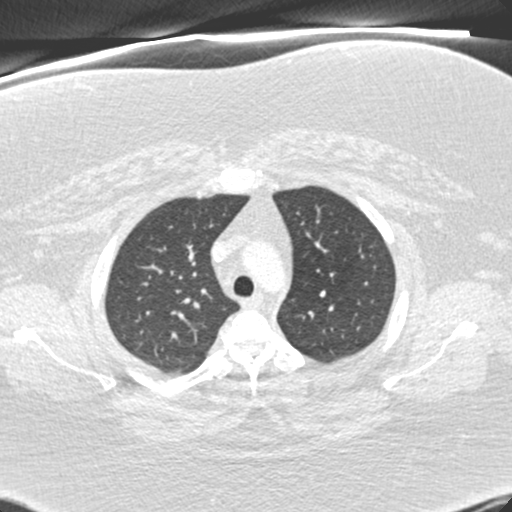
[im 183/202  mediastinal]
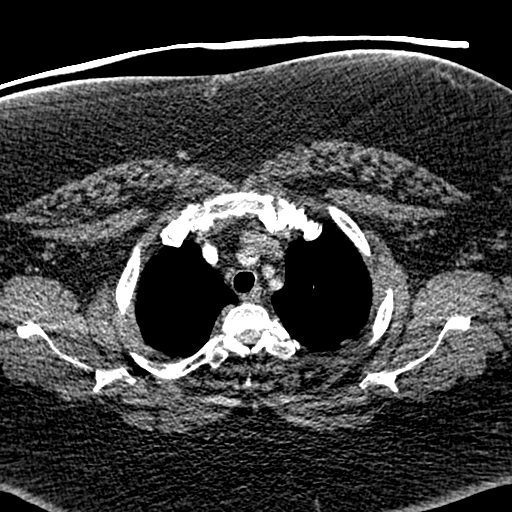

[17 of 38 positions shown; findings below may reference images not displayed]

FINDINGS: Multiple tiny failures occurred during multiple rescan
attempts of this patient.  Imaging  was further complicated by
patient body habitus overall today's exam has poor diagnostic
sensitivity for detecting pulmonary emboli, and it may be that the
patient will require ventilation perfusion lung scan.  I do not
discern obvious filling defect in the poorly opacified pulmonary
arteries.

An AP window node has a short axis diameter of 10 mm, borderline
enlarged.

There is subcutaneous stranding and lesion along the anterior
chest, possibly from folliculitis.

The visualized tracheobronchial tree appears normal.

Thoracic spondylosis noted.

Review of the MIP images confirms the above findings.
IMPRESSION: 1.  Due to multiple by the failures and patient body habitus,
sensitivity for embolus is low.  The pulmonary arteries are very
poorly opacified.  Follow-up ventilation perfusion lung scan may be
necessary.
2.  Subcutaneous stranding and lesion along the chest, query
folliculitis.

## 2011-12-01 ENCOUNTER — Emergency Department (HOSPITAL_COMMUNITY): Payer: Medicaid Other

## 2011-12-01 ENCOUNTER — Telehealth: Payer: Self-pay | Admitting: *Deleted

## 2011-12-01 ENCOUNTER — Encounter (HOSPITAL_COMMUNITY): Payer: Self-pay | Admitting: Emergency Medicine

## 2011-12-01 ENCOUNTER — Inpatient Hospital Stay (HOSPITAL_COMMUNITY)
Admission: EM | Admit: 2011-12-01 | Discharge: 2011-12-03 | DRG: 603 | Disposition: A | Discharge status: Home / Self Care | Payer: Medicaid Other | Attending: Family Medicine | Admitting: Family Medicine

## 2011-12-01 DIAGNOSIS — K219 Gastro-esophageal reflux disease without esophagitis: Secondary | ICD-10-CM | POA: Diagnosis present

## 2011-12-01 DIAGNOSIS — E785 Hyperlipidemia, unspecified: Secondary | ICD-10-CM | POA: Diagnosis present

## 2011-12-01 DIAGNOSIS — J449 Chronic obstructive pulmonary disease, unspecified: Secondary | ICD-10-CM | POA: Diagnosis present

## 2011-12-01 DIAGNOSIS — L02219 Cutaneous abscess of trunk, unspecified: Principal | ICD-10-CM | POA: Diagnosis present

## 2011-12-01 DIAGNOSIS — Z6841 Body Mass Index (BMI) 40.0 and over, adult: Secondary | ICD-10-CM

## 2011-12-01 DIAGNOSIS — I10 Essential (primary) hypertension: Secondary | ICD-10-CM

## 2011-12-01 DIAGNOSIS — L0390 Cellulitis, unspecified: Secondary | ICD-10-CM

## 2011-12-01 DIAGNOSIS — F3289 Other specified depressive episodes: Secondary | ICD-10-CM | POA: Diagnosis present

## 2011-12-01 DIAGNOSIS — IMO0001 Reserved for inherently not codable concepts without codable children: Secondary | ICD-10-CM | POA: Diagnosis present

## 2011-12-01 DIAGNOSIS — J4489 Other specified chronic obstructive pulmonary disease: Secondary | ICD-10-CM | POA: Diagnosis present

## 2011-12-01 DIAGNOSIS — E119 Type 2 diabetes mellitus without complications: Secondary | ICD-10-CM

## 2011-12-01 DIAGNOSIS — R112 Nausea with vomiting, unspecified: Secondary | ICD-10-CM | POA: Diagnosis present

## 2011-12-01 DIAGNOSIS — L03311 Cellulitis of abdominal wall: Secondary | ICD-10-CM

## 2011-12-01 DIAGNOSIS — F329 Major depressive disorder, single episode, unspecified: Secondary | ICD-10-CM | POA: Diagnosis present

## 2011-12-01 DIAGNOSIS — L0291 Cutaneous abscess, unspecified: Secondary | ICD-10-CM

## 2011-12-01 DIAGNOSIS — R739 Hyperglycemia, unspecified: Secondary | ICD-10-CM

## 2011-12-01 DIAGNOSIS — Z794 Long term (current) use of insulin: Secondary | ICD-10-CM

## 2011-12-01 HISTORY — DX: Bipolar II disorder: F31.81

## 2011-12-01 LAB — DIFFERENTIAL
Basophils Relative: 0 % (ref 0–1)
Lymphocytes Relative: 33 % (ref 12–46)
Lymphs Abs: 3.5 10*3/uL (ref 0.7–4.0)
Monocytes Relative: 7 % (ref 3–12)
Neutro Abs: 6 10*3/uL (ref 1.7–7.7)
Neutrophils Relative %: 57 % (ref 43–77)

## 2011-12-01 LAB — POCT I-STAT 3, VENOUS BLOOD GAS (G3P V)
Bicarbonate: 30.3 mEq/L — ABNORMAL HIGH (ref 20.0–24.0)
TCO2: 32 mmol/L (ref 0–100)
pCO2, Ven: 44.2 mmHg — ABNORMAL LOW (ref 45.0–50.0)
pH, Ven: 7.444 — ABNORMAL HIGH (ref 7.250–7.300)

## 2011-12-01 LAB — POCT I-STAT, CHEM 8
BUN: 9 mg/dL (ref 6–23)
Chloride: 96 mEq/L (ref 96–112)
Creatinine, Ser: 0.5 mg/dL (ref 0.50–1.10)
Glucose, Bld: 430 mg/dL — ABNORMAL HIGH (ref 70–99)
HCT: 44 % (ref 36.0–46.0)
Potassium: 4 mEq/L (ref 3.5–5.1)

## 2011-12-01 LAB — CBC
Hemoglobin: 14 g/dL (ref 12.0–15.0)
MCH: 30.7 pg (ref 26.0–34.0)
MCV: 89.9 fL (ref 78.0–100.0)
Platelets: 255 10*3/uL (ref 150–400)
RBC: 4.56 MIL/uL (ref 3.87–5.11)
WBC: 10.5 10*3/uL (ref 4.0–10.5)

## 2011-12-01 LAB — BASIC METABOLIC PANEL
CO2: 29 mEq/L (ref 19–32)
Chloride: 98 mEq/L (ref 96–112)
Creatinine, Ser: 0.41 mg/dL — ABNORMAL LOW (ref 0.50–1.10)
Glucose, Bld: 381 mg/dL — ABNORMAL HIGH (ref 70–99)

## 2011-12-01 LAB — GLUCOSE, CAPILLARY: Glucose-Capillary: 294 mg/dL — ABNORMAL HIGH (ref 70–99)

## 2011-12-01 MED ORDER — INSULIN REGULAR HUMAN 100 UNIT/ML IJ SOLN: 16.0000 [IU] | Freq: Once | INTRAMUSCULAR | Status: DC

## 2011-12-01 MED ORDER — SERTRALINE HCL 50 MG PO TABS
50.0000 mg | ORAL_TABLET | Freq: Every day | ORAL | Status: DC
  Administered 2011-12-02: 50 mg via ORAL
  Filled 2011-12-01 (×5): qty 1

## 2011-12-01 MED ORDER — VANCOMYCIN HCL IN DEXTROSE 1-5 GM/200ML-% IV SOLN
1000.0000 mg | Freq: Once | INTRAVENOUS | Status: AC
  Administered 2011-12-01: 1000 mg via INTRAVENOUS
  Filled 2011-12-01: qty 200

## 2011-12-01 MED ORDER — PANTOPRAZOLE SODIUM 40 MG PO TBEC
40.0000 mg | DELAYED_RELEASE_TABLET | Freq: Every day | ORAL | Status: DC
  Administered 2011-12-02 – 2011-12-03 (×2): 40 mg via ORAL
  Filled 2011-12-01 (×3): qty 1

## 2011-12-01 MED ORDER — SODIUM CHLORIDE 0.9 % IV BOLUS (SEPSIS)
1000.0000 mL | Freq: Once | INTRAVENOUS | Status: AC
  Administered 2011-12-01: 1000 mL via INTRAVENOUS

## 2011-12-01 MED ORDER — CARVEDILOL 6.25 MG PO TABS
6.2500 mg | ORAL_TABLET | Freq: Two times a day (BID) | ORAL | Status: DC
  Administered 2011-12-02 (×2): 6.25 mg via ORAL
  Filled 2011-12-01 (×5): qty 1

## 2011-12-01 MED ORDER — INSULIN ASPART 100 UNIT/ML ~~LOC~~ SOLN: 16.0000 [IU] | Freq: Once | SUBCUTANEOUS | Status: DC

## 2011-12-01 MED ORDER — TRAMADOL HCL 50 MG PO TABS
50.0000 mg | ORAL_TABLET | Freq: Four times a day (QID) | ORAL | Status: DC | PRN
  Administered 2011-12-02: 50 mg via ORAL
  Filled 2011-12-01: qty 1

## 2011-12-01 MED ORDER — INSULIN GLARGINE 100 UNIT/ML ~~LOC~~ SOLN
30.0000 [IU] | Freq: Every day | SUBCUTANEOUS | Status: DC
  Administered 2011-12-02: 30 [IU] via SUBCUTANEOUS
  Filled 2011-12-01: qty 3

## 2011-12-01 MED ORDER — INSULIN ASPART 100 UNIT/ML ~~LOC~~ SOLN
SUBCUTANEOUS | Status: AC
  Administered 2011-12-01: 16 [IU] via SUBCUTANEOUS
  Filled 2011-12-01: qty 1

## 2011-12-01 MED ORDER — MORPHINE SULFATE 4 MG/ML IJ SOLN
6.0000 mg | Freq: Once | INTRAMUSCULAR | Status: AC
  Administered 2011-12-01: 6 mg via INTRAVENOUS
  Filled 2011-12-01 (×2): qty 1

## 2011-12-01 MED ORDER — LORATADINE 10 MG PO TABS
10.0000 mg | ORAL_TABLET | Freq: Every day | ORAL | Status: DC
  Administered 2011-12-02 – 2011-12-03 (×2): 10 mg via ORAL
  Filled 2011-12-01 (×5): qty 1

## 2011-12-01 MED ORDER — PIPERACILLIN-TAZOBACTAM 3.375 G IVPB
3.3750 g | Freq: Once | INTRAVENOUS | Status: AC
  Administered 2011-12-02: 3.375 g via INTRAVENOUS
  Filled 2011-12-01: qty 50

## 2011-12-01 MED ORDER — ONDANSETRON HCL 4 MG PO TABS: 4.0000 mg | ORAL_TABLET | Freq: Four times a day (QID) | ORAL | Status: DC | PRN

## 2011-12-01 MED ORDER — ACETAMINOPHEN 650 MG RE SUPP: 650.0000 mg | Freq: Four times a day (QID) | RECTAL | Status: DC | PRN

## 2011-12-01 MED ORDER — SODIUM CHLORIDE 0.9 % IV SOLN
1500.0000 mg | Freq: Two times a day (BID) | INTRAVENOUS | Status: DC
  Administered 2011-12-02 – 2011-12-03 (×3): 1500 mg via INTRAVENOUS
  Filled 2011-12-01 (×4): qty 1500

## 2011-12-01 MED ORDER — ACETAMINOPHEN 325 MG PO TABS
650.0000 mg | ORAL_TABLET | Freq: Four times a day (QID) | ORAL | Status: DC | PRN
  Administered 2011-12-02 (×2): 650 mg via ORAL
  Filled 2011-12-01 (×2): qty 2

## 2011-12-01 MED ORDER — ALBUTEROL SULFATE HFA 108 (90 BASE) MCG/ACT IN AERS
2.0000 | INHALATION_SPRAY | RESPIRATORY_TRACT | Status: DC | PRN
  Filled 2011-12-01: qty 6.7

## 2011-12-01 MED ORDER — IOHEXOL 300 MG/ML  SOLN
40.0000 mL | Freq: Once | INTRAMUSCULAR | Status: AC | PRN
  Administered 2011-12-01: 40 mL via INTRAVENOUS

## 2011-12-01 MED ORDER — IOHEXOL 300 MG/ML  SOLN
100.0000 mL | Freq: Once | INTRAMUSCULAR | Status: AC | PRN
  Administered 2011-12-01: 100 mL via INTRAVENOUS

## 2011-12-01 MED ORDER — LISINOPRIL 10 MG PO TABS
10.0000 mg | ORAL_TABLET | Freq: Every day | ORAL | Status: DC
  Administered 2011-12-02 – 2011-12-03 (×2): 10 mg via ORAL
  Filled 2011-12-01 (×5): qty 1

## 2011-12-01 MED ORDER — VANCOMYCIN HCL 1000 MG IV SOLR
1500.0000 mg | INTRAVENOUS | Status: AC
  Filled 2011-12-01: qty 1500

## 2011-12-01 MED ORDER — ONDANSETRON HCL 4 MG/2ML IJ SOLN: 4.0000 mg | Freq: Four times a day (QID) | INTRAMUSCULAR | Status: DC | PRN

## 2011-12-01 MED ORDER — BUDESONIDE-FORMOTEROL FUMARATE 160-4.5 MCG/ACT IN AERO
2.0000 | INHALATION_SPRAY | Freq: Two times a day (BID) | RESPIRATORY_TRACT | Status: DC
  Administered 2011-12-02 – 2011-12-03 (×3): 2 via RESPIRATORY_TRACT
  Filled 2011-12-01: qty 6

## 2011-12-01 MED ORDER — PIPERACILLIN-TAZOBACTAM 3.375 G IVPB 30 MIN
3.3750 g | Freq: Three times a day (TID) | INTRAVENOUS | Status: DC
  Filled 2011-12-01 (×2): qty 50

## 2011-12-01 MED ORDER — DOCUSATE SODIUM 100 MG PO CAPS
100.0000 mg | ORAL_CAPSULE | Freq: Two times a day (BID) | ORAL | Status: DC | PRN
  Filled 2011-12-01: qty 1

## 2011-12-01 MED ORDER — HEPARIN SODIUM (PORCINE) 5000 UNIT/ML IJ SOLN
5000.0000 [IU] | Freq: Three times a day (TID) | INTRAMUSCULAR | Status: DC
  Administered 2011-12-02 – 2011-12-03 (×5): 5000 [IU] via SUBCUTANEOUS
  Filled 2011-12-01 (×8): qty 1

## 2011-12-01 MED ORDER — INSULIN ASPART 100 UNIT/ML ~~LOC~~ SOLN
0.0000 [IU] | Freq: Three times a day (TID) | SUBCUTANEOUS | Status: DC
  Administered 2011-12-02: 20 [IU] via SUBCUTANEOUS
  Administered 2011-12-02 (×2): 15 [IU] via SUBCUTANEOUS
  Administered 2011-12-03: 20 [IU] via SUBCUTANEOUS
  Administered 2011-12-03: 15 [IU] via SUBCUTANEOUS
  Filled 2011-12-01: qty 3

## 2011-12-01 MED ORDER — PIPERACILLIN-TAZOBACTAM 3.375 G IVPB 30 MIN: 3.3750 g | Freq: Once | INTRAVENOUS | Status: DC

## 2011-12-01 MED ORDER — CLINDAMYCIN PHOSPHATE 900 MG/50ML IV SOLN
900.0000 mg | INTRAVENOUS | Status: DC
  Filled 2011-12-01: qty 50

## 2011-12-01 NOTE — ED Provider Notes (Signed)
History     CSN: [id number]  Arrival date & time 12/01/11  1317   First MD Initiated Contact with Patient 12/01/11 1705      Chief Complaint  Patient presents with  . Generalized Body Aches  . Abscess    (Consider location/radiation/quality/duration/timing/severity/associated sxs/prior treatment) Patient is a 35 y.o. female presenting with abscess. The history is provided by the patient.  Abscess  This is a new problem. The current episode started less than one week ago. The onset was gradual. The abscess is present on the abdomen. The problem is moderate. The abscess is characterized by redness, painfulness, draining and swelling. The abscess first occurred at home. Associated symptoms include vomiting. Pertinent negatives include no fever and no diarrhea.  Pt reports swelling and an abscess on right lower abdomen. Statse hx of abscesses in the past, but never this severe. States started taking "unknown" antibitic that she had left over from last time she had an absess with no improvement. Denies fever, but stats she is having body aches, and generalized malaise.   Past Medical History  Diagnosis Date  . Morbid obesity   . Extreme obesity with alveolar hypoventilation   . Morbid obesity   . Alveolar hypoventilation   . Diabetes mellitus   . Depression   . Asthma   . GERD (gastroesophageal reflux disease)   . Rectal fissure   . Hypertension   . Costochondritis   . Cellulitis 08/2010-08/2011    Past Surgical History  Procedure Date  . Carpal tunnel release     History reviewed. No pertinent family history.  History  Substance Use Topics  . Smoking status: Former Smoker    Quit date: 12/07/2003  . Smokeless tobacco: Never Used  . Alcohol Use: No    OB History    Grav Para Term Preterm Abortions TAB SAB Ect Mult Living                  Review of Systems  Constitutional: Positive for chills. Negative for fever.  HENT: Negative for neck pain and neck stiffness.     Eyes: Negative.   Respiratory: Negative.   Cardiovascular: Negative.   Gastrointestinal: Positive for nausea, vomiting and abdominal pain. Negative for diarrhea.  Genitourinary: Negative.   Musculoskeletal: Negative.   Skin: Positive for color change and wound.  Neurological: Negative.   Psychiatric/Behavioral: Negative.     Allergies  Nubain  Home Medications   Current Outpatient Rx  Name Route Sig Dispense Refill  . BUDESONIDE-FORMOTEROL FUMARATE 160-4.5 MCG/ACT IN AERO Inhalation Inhale 2 puffs into the lungs 2 (two) times daily. 1 Inhaler 12  . CARVEDILOL 6.25 MG PO TABS Oral Take 6.25 mg by mouth 2 (two) times daily with a meal.      . CETIRIZINE HCL 10 MG PO TABS Oral Take 10 mg by mouth daily.      Marland Kitchen GLIMEPIRIDE 4 MG PO TABS Oral Take 4 mg by mouth daily before breakfast.      . INSULIN ASPART 100 UNIT/ML [place] SOLN Subcutaneous Inject 20-30 Units into the skin 3 (three) times daily before meals. Take 20 units at breakfast and lunch. Take 30 units at dinner    . INSULIN GLARGINE 100 UNIT/ML [place] SOLN Subcutaneous Inject 70 Units into the skin 2 (two) times daily.      Marland Kitchen LISINOPRIL 10 MG PO TABS Oral Take 10 mg by mouth daily.      . MELOXICAM 15 MG PO TABS Oral Take 15 mg  by mouth daily.      . NYSTATIN 100000 UNIT/GM EX POWD Topical Apply topically daily.      Marland Kitchen PANTOPRAZOLE SODIUM 40 MG PO TBEC Oral Take 40 mg by mouth daily.      . SERTRALINE HCL 50 MG PO TABS Oral Take 50 mg by mouth daily. Per [facility]     . TRAMADOL HCL 50 MG PO TABS Oral Take 50 mg by mouth 2 (two) times daily as needed. For breakthru pain     . ZOLPIDEM TARTRATE 5 MG PO TABS Oral Take 5 mg by mouth at bedtime as needed.      Marland Kitchen ACCU-CHEK AVIVA PLUS VI STRP  USE AS DIRECTED FOUR TIMES A DAY BEFORE MEALS AND AT BEDTIME 200 each PRN  . ALBUTEROL SULFATE HFA 108 (90 BASE) MCG/ACT IN AERS Inhalation Inhale 2 puffs into the lungs every 4 (four) hours as needed for wheezing. 1 Inhaler 6  . DICLOFENAC  SODIUM 1 % TD GEL Topical Apply 1 application topically 4 (four) times daily. 100 g 3    BP 114/70  Pulse 101  Temp(Src) 98 F (36.7 C) (Oral)  Resp 16  Ht 5\' 3"  (1.6 m)  Wt 370 lb (167.831 kg)  BMI 65.54 kg/m2  SpO2 96%  Physical Exam  Nursing note and vitals reviewed. Constitutional: She is oriented to person, place, and time. She appears well-developed and well-nourished. No distress.  HENT:  Head: Normocephalic and atraumatic.  Eyes: Conjunctivae are normal.  Neck: Normal range of motion. Neck supple.  Cardiovascular: Normal rate, regular rhythm and normal heart sounds.   Pulmonary/Chest: Effort normal and breath sounds normal. No respiratory distress.  Abdominal: Soft. Bowel sounds are normal. There is tenderness.       Extensive induration and cellulitis of right lower abdomen and panus. Abscess over suprapubic area of abdominal wall.   Musculoskeletal: Normal range of motion.  Lymphadenopathy:    She has no cervical adenopathy.  Neurological: She is alert and oriented to person, place, and time.  Skin: Skin is warm and dry. There is erythema.  Psychiatric: She has a normal mood and affect.    ED Course  Procedures (including critical care time)  Pt with lower abdominal cellulitis and an abscess. Labs ordered. Will monitor. VS normal.  7:13 PM Pt hyperglycemic at 430. Fluid bolus and insulin ordered. Vanc and zosyn ordered for antibiotic. Pt does not appear septic, no systemic symptoms. Will admit. CT ab/pelvis ordered for further evaluation of the abscess.  Spoke with [facility], will admit pt. MDM          Lottie Mussel, PA 12/01/11 1914

## 2011-12-01 NOTE — Telephone Encounter (Signed)
Pt is requesting a new Rx for a CPAP machine the motor is gone out. She has called [facility] and was told that she will need a new RX.

## 2011-12-01 NOTE — ED Notes (Signed)
   CBG 324   

## 2011-12-01 NOTE — H&P (Signed)
[name] is an 35 y.o. female.   History and Physical Note Family Medicine Teaching Service Amber M. Hairford, MD Service Pager: [phone]  Chief Complaint: Boil on lower abdomen HPI: Patient is a 36 yo F with PMH of uncontrolled DM, obesity, HTN, multiple episodes of cellulitis who presents to the ED for evaluation of a boil on her lower abdomen. Patient states the area became painful starting 4 days ago. It began draining spontaneously 2 days ago. She reports the drainage is bloody and  no foul odor that she has noticed. Patient states she received a prescription for Flagyl for trich treatment 2 days ago. Once she began taking the Flagyl, her pain improved some. Since then, the pain has been getting progressively worse in the last few days. Patient starting having nausea, vomiting, night sweats and chills. She reports that she stays well hydrated. Recently her blood sugars have been high. She checks her blood sugar daily; 300-350 for the last few days. Patient states her sugar "stays high" and sometimes she feels bad. She has been sick recently but reports there is a virus going around her house with cough/congestion.    Past Medical History  Diagnosis Date  . Morbid obesity   . Extreme obesity with alveolar hypoventilation   . Morbid obesity   . Alveolar hypoventilation   . Diabetes mellitus   . Depression   . Asthma   . GERD (gastroesophageal reflux disease)   . Rectal fissure   . Hypertension   . Costochondritis   . Cellulitis 08/2010-08/2011    Past Surgical History  Procedure Date  . Carpal tunnel release     History reviewed. No pertinent family history. Social History: Does not smoke, Does not drink. Goddaughter, 4 children, goddaughters mother and her fiance live at home.   Allergies:  Allergies  Allergen Reactions  . Nubain (Nalbuphine Hcl) Other (See Comments)    Nervous...makes her feel like something is crawling on her.    Medications Prior to Admission    Medication Dose Route Frequency Provider Last Rate Last Dose  . insulin aspart (novoLOG) 100 UNIT/ML injection        16 Units at 12/01/11 1900  . insulin aspart (novoLOG) injection 16 Units  16 Units Subcutaneous Once Tatyana A Kirichenko, PA      . morphine 4 MG/ML injection 6 mg  6 mg Intravenous Once Tatyana A Kirichenko, PA      . piperacillin-tazobactam (ZOSYN) IVPB 3.375 g  3.375 g Intravenous Once Christian M Buettner, PHARMD      . sodium chloride 0.9 % bolus 1,000 mL  1,000 mL Intravenous Once Tatyana A Kirichenko, PA   1,000 mL at 12/01/11 1904  . vancomycin (VANCOCIN) IVPB 1000 mg/200 mL premix  1,000 mg Intravenous Once Tatyana A Kirichenko, PA      . DISCONTD: clindamycin (CLEOCIN) IVPB 900 mg  900 mg Intravenous To Major Tatyana A Kirichenko, PA      . DISCONTD: insulin regular (NOVOLIN R,HUMULIN R) 100 units/mL injection 16 Units  16 Units Subcutaneous Once Tatyana A Kirichenko, PA       Medications Prior to Admission  Medication Sig Dispense Refill  . budesonide-formoterol (SYMBICORT) 160-4.5 MCG/ACT inhaler Inhale 2 puffs into the lungs 2 (two) times daily.  1 Inhaler  12  . insulin aspart (NOVOLOG FLEXPEN) 100 UNIT/ML injection Inject 20-30 Units into the skin 3 (three) times daily before meals. Take 20 units at breakfast and lunch. Take 30 units at dinner      .  insulin glargine (LANTUS) 100 UNIT/ML injection Inject 70 Units into the skin 2 (two) times daily.        Marland Kitchen nystatin (MYCOSTATIN) powder Apply topically daily.        . sertraline (ZOLOFT) 50 MG tablet Take 50 mg by mouth daily. Per [facility]       . traMADol (ULTRAM) 50 MG tablet Take 50 mg by mouth 2 (two) times daily as needed. For breakthru pain       . ACCU-CHEK AVIVA PLUS test strip USE AS DIRECTED FOUR TIMES A DAY BEFORE MEALS AND AT BEDTIME  200 each  PRN  . albuterol (VENTOLIN HFA) 108 (90 BASE) MCG/ACT inhaler Inhale 2 puffs into the lungs every 4 (four) hours as needed for wheezing.  1 Inhaler  6  .  diclofenac sodium (VOLTAREN) 1 % GEL Apply 1 application topically 4 (four) times daily.  100 g  3    Results for orders placed during the hospital encounter of 12/01/11 (from the past 48 hour(s))  CBC     Status: Normal   Collection Time   12/01/11  5:46 PM      Component Value Range Comment   WBC 10.5  4.0 - 10.5 (K/uL)    RBC 4.56  3.87 - 5.11 (MIL/uL)    Hemoglobin 14.0  12.0 - 15.0 (g/dL)    HCT 16.1  09.6 - 04.5 (%)    MCV 89.9  78.0 - 100.0 (fL)    MCH 30.7  26.0 - 34.0 (pg)    MCHC 34.1  30.0 - 36.0 (g/dL)    RDW 40.9  81.1 - 91.4 (%)    Platelets 255  150 - 400 (K/uL)   DIFFERENTIAL     Status: Normal   Collection Time   12/01/11  5:46 PM      Component Value Range Comment   Neutrophils Relative 57  43 - 77 (%)    Neutro Abs 6.0  1.7 - 7.7 (K/uL)    Lymphocytes Relative 33  12 - 46 (%)    Lymphs Abs 3.5  0.7 - 4.0 (K/uL)    Monocytes Relative 7  3 - 12 (%)    Monocytes Absolute 0.8  0.1 - 1.0 (K/uL)    Eosinophils Relative 2  0 - 5 (%)    Eosinophils Absolute 0.2  0.0 - 0.7 (K/uL)    Basophils Relative 0  0 - 1 (%)    Basophils Absolute 0.0  0.0 - 0.1 (K/uL)   POCT I-STAT, CHEM 8     Status: Abnormal   Collection Time   12/01/11  6:07 PM      Component Value Range Comment   Sodium 136  135 - 145 (mEq/L)    Potassium 4.0  3.5 - 5.1 (mEq/L)    Chloride 96  96 - 112 (mEq/L)    BUN 9  6 - 23 (mg/dL)    Creatinine, Ser 7.82  0.50 - 1.10 (mg/dL)    Glucose, Bld 956 (*) 70 - 99 (mg/dL)    Calcium, Ion 2.13  1.12 - 1.32 (mmol/L)    TCO2 31  0 - 100 (mmol/L)    Hemoglobin 15.0  12.0 - 15.0 (g/dL)    HCT 08.6  57.8 - 46.9 (%)    No results found.  ROS Please see HPI. States she has chills. Denies any other rashes/bumps. No dysuria, constipation, diarrhea.   Blood pressure 122/81, pulse 99, temperature 98 F (36.7 C), temperature source Oral,  resp. rate 18, height 5\' 3"  (1.6 m), weight 370 lb (167.831 kg), SpO2 98.00%. Physical Exam  Vitals  reviewed. Constitutional: She is oriented to person, place, and time. She appears well-developed. No distress (but appears uncomfortable).  HENT:  Head: Normocephalic and atraumatic.  Eyes: Pupils are equal, round, and reactive to light.  Cardiovascular: Normal rate, regular rhythm and normal heart sounds.   Respiratory: Effort normal and breath sounds normal. She has no wheezes.  GI: Soft. There is tenderness in the suprapubic area.       Morbidly obese. 2x2 cm suprapubic abscess located slightly right of midline. Erythema and warmth surrounding abscess. Currently draining serous fluid. Exquisitely tender to palpation of the area. No signs of yeast between pannus.   Lymphadenopathy:    She has no cervical adenopathy.  Neurological: She is alert and oriented to person, place, and time. No cranial nerve deficit.     Assessment/Plan 35 yo F with PMH of DM presenting with abscess of the lower abdomen and cellulitis. 1. Abscess- Lower abdomen superficial abscess with cellulitis. Afebrile, WBC 10.5 - Awaiting abdominal CT scan to evaluate extent of abscess/cellulitis - Vanc and Zosyn for at least 24 hours per pharmacy consult - Continue to monitor WBC and fever curve. - Tramadol and Tylenol prn for pain  2. DM- Uncontrolled at home. Last A1C was 13 in August 2012.  - Lantus 30U daily - Resistant SSI  - Will increase Lantus dose and/or add mealtime coverage based on her CBG's during admission.  3. HTN- BP stable in ED. - Continue home regimen of Coreg and Lisinopril. Monitor BP closely  4. HLD- Continue home medication  5. COPD- Patient is not currently smoking.  - Continue home inhalers of Symbicort daily and Albuterol PRN - Claritin daily - CPAP qhs (unsure of settings. Will ask RT to begin treatment.)  6. Depression- Continue Zoloft daily  7. FEN/GI- Carb modified diet. Bowel regimen prn 8. PPx- Heparin SQ 9. Dispo- Continue IV antibiotics for at least 24 hours. Discharge  pending clinical improvement.   HAIRFORD, AMBER 12/01/2011, 7:50 PM   R2 Addendum of R1 History and Physical:   Briefly, this is a 35 year old morbidly obese female with poorly controlled DM II who comes in with cellulitis and draining abscess of pannus.   Physical exam:  BP 142/83  Pulse 99  Temp(Src) 98.2 F (36.8 C) (Oral)  Resp 18  Ht 5\' 3"  (1.6 m)  Wt 370 lb (167.831 kg)  BMI 65.54 kg/m2  SpO2 92% General appearance: alert, cooperative and morbidly obese Eyes: conjunctivae/corneas clear. PERRL, EOM's intact. Fundi benign. Throat: lips, mucosa, and tongue normal; teeth and gums normal Lungs: clear to auscultation bilaterally Heart: regular rate and rhythm, S1, S2 normal, no murmur, click, rub or gallop Abdomen: obese, soft, but pannus with induration.  Pelvic: Patient with suprapubic erythema and 5cm area of abscess that is draining.  Extremities: extremities normal, atraumatic, no cyanosis or edema Pulses: 2+ and symmetric Skin: Patient with cellulitis of suprapubic and pannus area.   A/P 35 year old Female, morbidly obese with multiple medical problems admitted for abdominal/pannus/suprapubic cellulitis and abscess:  1) ID- cellulitis and draining abscess. Will continue Vanc and Zosyn started in ED.  CT scan ordered to rule out deeper abscess or spreading infection.  Monitor fever and WBC count 2) Endocrine/DM- poorly controlled at home, patient reports she takes 70 units of lantus daily, will give carb modified diet and 30 units of lantus, and resistant  SSI.  Emphasized importance of blood sugar control with infections. Will plan on DM teaching while in hospital.  3) CV/HTN-pt with relatively well controlled pressures in ED, continue home regimen.  4) Pulm- patient reports she has COPD, but is never smoker, likely restrictive disease due to obesity/sleep apnea.  Will continue home regimen and CPAP at bedtime.  5) FEN/GI- Carb modified diet, bowel regimen as needed.  6) DVT  PPX- SQ heparin 7) Dispo- Pending improvement on IV antibiotics and transition to PO antibiotics.

## 2011-12-01 NOTE — ED Notes (Signed)
[facility] paged to [phone]

## 2011-12-01 NOTE — ED Notes (Signed)
Admitting MD's at bedside for pt evaluation, plan of care was updated by MD's. Family at bedside, will continue to monitor pt and pt is awaiting inpt bed assignment.

## 2011-12-01 NOTE — ED Notes (Signed)
Pt c/o generalized body aches and vaginal pain; pt sts possible cellulitis or boil; pt sts vomiting started last night

## 2011-12-01 NOTE — ED Notes (Signed)
C/o abscess to right side genital area since Saturday, progressively getting worse. +drainage 2 days ago.  Denies fever, +chills. Reports has had abscess I & D'd in the past.

## 2011-12-01 NOTE — Progress Notes (Signed)
ANTIBIOTIC CONSULT NOTE - INITIAL  Pharmacy Consult for Vancomycin/Zosyn Indication: Lower abdominal cellulitis  Allergies  Allergen Reactions  . Nubain (Nalbuphine Hcl) Other (See Comments)    Nervous...makes her feel like something is crawling on her.    Patient Measurements: Height: 5\' 3"  (160 cm) Weight: 370 lb (167.831 kg) IBW/kg (Calculated) : 52.4   Vital Signs: Temp: 98 F (36.7 C) (12/26 1713) Temp src: Oral (12/26 1713) BP: 122/81 mmHg (12/26 1713) Pulse Rate: 99  (12/26 1713) Intake/Output from previous day:   Intake/Output from this shift:    Labs:  Basename 12/01/11 1940 12/01/11 1807 12/01/11 1746  WBC -- -- 10.5  HGB -- 15.0 14.0  PLT -- -- 255  LABCREA -- -- --  CREATININE 0.41* 0.50 --   Estimated Creatinine Clearance: 155.7 ml/min (by C-G formula based on Cr of 0.41). No results found for this basename: VANCOTROUGH:2,VANCOPEAK:2,VANCORANDOM:2,GENTTROUGH:2,GENTPEAK:2,GENTRANDOM:2,TOBRATROUGH:2,TOBRAPEAK:2,TOBRARND:2,AMIKACINPEAK:2,AMIKACINTROU:2,AMIKACIN:2, in the last 72 hours   Microbiology: No results found for this or any previous visit (from the past 720 hour(s)).  Medical History: Past Medical History  Diagnosis Date  . Morbid obesity   . Extreme obesity with alveolar hypoventilation   . Morbid obesity   . Alveolar hypoventilation   . Diabetes mellitus   . Depression   . Asthma   . GERD (gastroesophageal reflux disease)   . Rectal fissure   . Hypertension   . Costochondritis   . Cellulitis 08/2010-08/2011    Medications:   (Not in a hospital admission) Assessment: 35 y/o female patient admitted with lower abdominal cellulitis requiring broad spectrum antibiotics. Received 1g vancomycin in ED.   Goal of Therapy:  Vancomycin trough level 10-15 mcg/ml  Plan:  Give additional Vancomycin 1.5g now (2.5g total load) then 1.5g q12h. Zosyn 3.375g IV q8h, and monitor renal function.  Measure antibiotic drug levels at steady  [address] PharmD, [place] Pager [phone] 12/01/2011,9:04 PM

## 2011-12-01 NOTE — ED Provider Notes (Signed)
This 35 year old female has several days of lower abdominal redness warmth tenderness cellulitis and minimal drainage over a scab with examination showing morbid obesity with lower abdominal and suprapubic area showing diffuse cellulitis with warmth induration and tenderness erythema and seemingly a superficial eschar with no obvious fluctuance or subcutaneous emphysema however CT scan will be ordered to help rule out abscess and family medicine will be consult for admission request, despite the cellulitis the patient is a nontoxic appearance with normal speech.  Medical screening examination/treatment/procedure(s) were conducted as a shared visit with non-physician practitioner(s) and myself.  I personally evaluated the patient during the encounter  Hurman Horn, MD 12/05/11 2125

## 2011-12-02 ENCOUNTER — Ambulatory Visit: Payer: Medicaid Other | Admitting: Family Medicine

## 2011-12-02 LAB — CBC
Hemoglobin: 13.2 g/dL (ref 12.0–15.0)
MCHC: 32.7 g/dL (ref 30.0–36.0)
RDW: 13.1 % (ref 11.5–15.5)

## 2011-12-02 LAB — GLUCOSE, CAPILLARY: Glucose-Capillary: 306 mg/dL — ABNORMAL HIGH (ref 70–99)

## 2011-12-02 LAB — BASIC METABOLIC PANEL
CO2: 30 mEq/L (ref 19–32)
Chloride: 94 mEq/L — ABNORMAL LOW (ref 96–112)
GFR calc Af Amer: >90 mL/min (ref >90)
GFR calc non Af Amer: >90 mL/min (ref >90)
Glucose, Bld: 413 mg/dL — ABNORMAL HIGH (ref 70–99)
Sodium: 133 mEq/L — ABNORMAL LOW (ref 135–145)

## 2011-12-02 MED ORDER — PIPERACILLIN-TAZOBACTAM 3.375 G IVPB 30 MIN
3.3750 g | Freq: Three times a day (TID) | INTRAVENOUS | Status: DC
  Administered 2011-12-02 – 2011-12-03 (×3): 3.375 g via INTRAVENOUS
  Filled 2011-12-02 (×6): qty 50

## 2011-12-02 MED ORDER — INSULIN GLARGINE 100 UNIT/ML ~~LOC~~ SOLN
70.0000 [IU] | Freq: Every day | SUBCUTANEOUS | Status: DC
  Administered 2011-12-02: 70 [IU] via SUBCUTANEOUS

## 2011-12-02 MED ORDER — INSULIN GLARGINE 100 UNIT/ML ~~LOC~~ SOLN: 50.0000 [IU] | Freq: Every day | SUBCUTANEOUS | Status: DC

## 2011-12-02 NOTE — Progress Notes (Signed)
Inpatient Diabetes Program Recommendations  AACE/ADA: New Consensus Statement on Inpatient Glycemic Control (2009)  Target Ranges:  Prepandial:   less than 140 mg/dL      Peak postprandial:   less than 180 mg/dL (1-2 hours)      Critically ill patients:  140 - 180 mg/dL   Reason: Hyperglycemia  Inpatient Diabetes Program Recommendations Insulin - Basal: Med rec states Lantus 70 units BID  Noted Lantus increased to 1/2 home dose. Will continue to follow.

## 2011-12-02 NOTE — Progress Notes (Signed)
Patient wants to update her Advanced Directive and HCPOA.  She was given a copy of the Advanced Directive to review this evening and plan to complete the document with notary and witnesses tomorrow.  Darylene Price, BSW, 12/02/2011 4:44 PM

## 2011-12-02 NOTE — Progress Notes (Signed)
PGY-1 Daily Progress Note Family Medicine Teaching Service D. [place] Rolene Arbour, MD Service Pager: [phone]  Patient name: [name]  Medical record [id number] Date of birth:[birth year] Age: 35 y.o. Gender: female  LOS: 1 day   Subjective: Feeling better. Pain is less intense. Pt able to tolerate oral DM Diet, no changes on her urinary or bowel habits. Afebrile  Objective:  Vitals: Temp:   98.7 F Pulse Rate: 97   Resp: 20   BP: 146/73 mmHg SpO2: 96 % at RA.  Intake/Output Summary (Last 24 hours) at 12/02/11 1438 Last data filed at 12/02/11 1300  Gross per 24 hour  Intake    240 ml  Output   1450 ml  Net  -1210 ml   Physical Exam: Constitutional:  No distress. HENT: Moist Mucosas. Cardiovascular: Normal rate, regular rhythm and normal heart sounds.  Respiratory: Effort normal and breath sounds normal. She has no wheezes.  GI: Soft. There is tenderness in the suprapubic area.  Morbidly obese. 2x2 cm suprapubic abscess located slightly right of midline. Erythema and warmth surrounding abscess. Currently draining serous fluid.  Tender to palpation of the area. No signs of yeast between pannus.  Lymphadenopathy:  Neurological: She is alert and oriented to person, place, and time. No neurologic focalization.  Labs and imaging:  CBC  Lab 12/02/11 0528 12/01/11 1807 12/01/11 1746  WBC 8.4 -- 10.5  HGB 13.2 15.0 14.0  HCT 40.4 44.0 41.0  PLT 256 -- 255   BMET  Lab 12/02/11 0528 12/01/11 1940 12/01/11 1807  NA 133* 136 136  K 3.8 3.8 4.0  CL 94* 98 96  CO2 30 29 --  BUN 9 9 9   CREATININE 0.41* 0.41* 0.50  LABGLOM -- -- --  GLUCOSE 413* -- --  CALCIUM 8.7 8.8 --    Ct Abdomen Pelvis W Contrast 12/01/2011 IMPRESSION:  1.  No evidence of abscess. 2.  Diffuse soft tissue edema along the patient's pannus, with associated skin thickening, compatible with panniculitis. 3.  Stable right adrenal adenoma again noted.  Original Report Authenticated By: Tonia Ghent,  M.D.   Medications: Medication Dose Route Frequency  . acetaminophen (TYLENOL) tablet 650 mg  650 mg Oral Q6H PRN    . acetaminophen (TYLENOL) suppository 650 mg  650 mg Rectal Q6H PRN  . albuterol (PROVENTIL HFA;VENTOLIN HFA) 108 (90 BASE) MCG/ACT inhaler 2 puff  2 puff Inhalation Q4H PRN  . budesonide-formoterol (SYMBICORT) 160-4.5 MCG/ACT inhaler 2 puff  2 puff Inhalation BID  . carvedilol (COREG) tablet 6.25 mg  6.25 mg Oral BID WC  . docusate sodium (COLACE) capsule 100 mg  100 mg Oral BID PRN  . heparin injection 5,000 Units  5,000 Units Subcutaneous Q8H  . insulin aspart (novoLOG) injection 0-20 Units  0-20 Units Subcutaneous TID WC  . insulin glargine (LANTUS) injection 70 Units  70 Units Subcutaneous QHS  . lisinopril (PRINIVIL,ZESTRIL) tablet 10 mg  10 mg Oral Daily  . loratadine (CLARITIN) tablet 10 mg  10 mg Oral Daily  . morphine 4 MG/ML injection 6 mg  6 mg Intravenous Once  . ondansetron (ZOFRAN) tablet 4 mg  4 mg Oral Q6H PRN  . pantoprazole (PROTONIX) EC tablet 40 mg  40 mg Oral Daily  . piperacillin-tazobactam (ZOSYN) IVPB 3.375 g  3.375 g Intravenous Q8H  . sertraline (ZOLOFT) tablet 50 mg  50 mg Oral Daily  . traMADol (ULTRAM) tablet 50 mg  50 mg Oral Q6H PRN  . vancomycin (VANCOCIN) 1,500  mg in sodium chloride 0.9 % 500 mL IVPB  1,500 mg Intravenous To Major   Assessment and Plan: 35 yo F with PMH of morbid obesity and DM admitted with abscess of the lower abdomen and cellulitis.  1. Abscess- Lower abdomen superficial abscess with cellulitis. Afebrile, WBC improving since yesterday.  - Continue Vanc and Zosyn and possibly tomorrow change to oral antibiotic. - Continue to monitor WBC and fever curve.  - Tramadol and Tylenol prn for pain  2. DM- Uncontrolled at home. Last A1C was 13 in August 2012.  - Lantus increased to 70 units daily. Pt takes at home 70 Lantus BID and Novolog 20-20-30 withy meals. Possible increasing dose depending on pt CBG's - Resistant SSI    3. HTN- BP stable in ED.  - Continue home regimen of Coreg and Lisinopril. Monitor BP closely  4. HLD- Continue home medication  5. COPD- Patient is not currently smoking.  - Continue home inhalers of Symbicort daily and Albuterol PRN  - Claritin daily  - CPAP qhs (unsure of settings. Will ask RT to begin treatment.)  6. Depression- Continue Zoloft daily  7. FEN/GI- Carb modified diet. Bowel regimen prn  8. PPx- Heparin SQ  9. Dispo-  Discharge pending clinical improvement.   D. [place] Rolene Arbour, MD PGY1, [name] Medicine Teaching Service Pager [phone] 12/02/2011

## 2011-12-02 NOTE — Progress Notes (Signed)
  Subjective:    Patient ID: [name], female    DOB: [birth year], 35 y.o.   MRN: [id number]  Abscess  Attending admit note: I have seen and examined patient.  Reviewed and co signed resident admit note.  Reviewed labs.   [name] has a host of medical problems all related to her morbid obesity.  Her reason for admit is cellulitis of the pannus and suprapubic region.  Exam is consistent with cellulitis.  CT did not show abscess.  Agree with treatment.  I am saddened by her chronic condition and her lack of effort to change the downhill trajectory of her health.  We will treat the acute problem and dispair over her chronic issues.    Review of Systems     Objective:   Physical Exam        Assessment & Plan:

## 2011-12-02 NOTE — Progress Notes (Signed)
Set up pt with CPAP FFM on auto titrate mode. Instructed pt on placing herself CPAP. Pt said she would place herself on when ready. No complications noted.

## 2011-12-03 ENCOUNTER — Telehealth: Payer: Self-pay | Admitting: *Deleted

## 2011-12-03 LAB — GLUCOSE, CAPILLARY
Glucose-Capillary: 304 mg/dL — ABNORMAL HIGH (ref 70–99)
Glucose-Capillary: 351 mg/dL — ABNORMAL HIGH (ref 70–99)

## 2011-12-03 MED ORDER — SULFAMETHOXAZOLE-TRIMETHOPRIM 800-160 MG PO TABS: 2.0000 | ORAL_TABLET | Freq: Two times a day (BID) | ORAL | Status: AC

## 2011-12-03 MED ORDER — MUPIROCIN CALCIUM 2 % EX CREA: TOPICAL_CREAM | Freq: Three times a day (TID) | CUTANEOUS | Status: DC

## 2011-12-03 MED ORDER — MUPIROCIN CALCIUM 2 % EX CREA: TOPICAL_CREAM | Freq: Three times a day (TID) | CUTANEOUS | Status: AC

## 2011-12-03 MED ORDER — SULFAMETHOXAZOLE-TRIMETHOPRIM 800-160 MG PO TABS: 2.0000 | ORAL_TABLET | Freq: Two times a day (BID) | ORAL | Status: DC

## 2011-12-03 NOTE — Progress Notes (Signed)
I spoke with the MD regarding the pt's mail-order Rx needs. The MD feels she will be ready around noon today with all the paperwork. She will call the RN when the Rx's are ready. Arnoldo Morale RN

## 2011-12-03 NOTE — Telephone Encounter (Signed)
Called pt's home and spoke with Betsey Holiday to tell pt, that I will fax order for replacement unit of C-pap machine to [facility]. He will let pt know. Lorenda Hatchet, Renato Battles

## 2011-12-03 NOTE — Progress Notes (Signed)
  Subjective:    Patient ID: [name], female    DOB: 11-20-1976, 35 y.o.   MRN: [id number]  Abscess  Seen and examined.  [name] feels much better and is asking to go home.  Ambulating at her baseline.  Tolerating PO.  Cellulitis improving.  Agree with resident's plans to switch to oral antibiotics and DC to home.   Review of Systems     Objective:   Physical Exam        Assessment & Plan:

## 2011-12-03 NOTE — Discharge Summary (Signed)
Physician Discharge Summary  Patient ID: [name] [id number] [birth year] 35 y.o.  Admit date: 12/01/2011 Discharge date: 12/03/2011  PCP: Clementeen Graham, MD   Discharge Diagnosis: 1. Panniculitis with superficial abscess. 2.DM 3.HTN 4.HLD 5.COPD 6.Depresion 7.Morbid Obesity.   Discharge Medications  Alisyn, [facility]  Home Medication Instructions FAO:[id number]   Printed on:12/03/11 1253  Medication Information                    traMADol (ULTRAM) 50 MG tablet Take 50 mg by mouth 2 (two) times daily as needed. For breakthru pain            sertraline (ZOLOFT) 50 MG tablet Take 50 mg by mouth daily. Per [facility]            budesonide-formoterol (SYMBICORT) 160-4.5 MCG/ACT inhaler Inhale 2 puffs into the lungs 2 (two) times daily.           insulin glargine (LANTUS) 100 UNIT/ML injection Inject 70 Units into the skin 2 (two) times daily.             insulin aspart (NOVOLOG FLEXPEN) 100 UNIT/ML injection Inject 20-30 Units into the skin 3 (three) times daily before meals. Take 20 units at breakfast and lunch. Take 30 units at dinner           nystatin (MYCOSTATIN) powder Apply topically daily.             albuterol (VENTOLIN HFA) 108 (90 BASE) MCG/ACT inhaler Inhale 2 puffs into the lungs every 4 (four) hours as needed for wheezing.           diclofenac sodium (VOLTAREN) 1 % GEL Apply 1 application topically 4 (four) times daily.           ACCU-CHEK AVIVA PLUS test strip USE AS DIRECTED FOUR TIMES A DAY BEFORE MEALS AND AT BEDTIME           carvedilol (COREG) 6.25 MG tablet Take 6.25 mg by mouth 2 (two) times daily with a meal.             cetirizine (ZYRTEC) 10 MG tablet Take 10 mg by mouth daily.             glimepiride (AMARYL) 4 MG tablet Take 4 mg by mouth daily before breakfast.             lisinopril (PRINIVIL,ZESTRIL) 10 MG tablet Take 10 mg by mouth daily.             meloxicam (MOBIC) 15 MG tablet Take 15 mg by mouth daily.               pantoprazole (PROTONIX) 40 MG tablet Take 40 mg by mouth daily.             zolpidem (AMBIEN) 5 MG tablet Take 5 mg by mouth at bedtime as needed.             mupirocin cream (BACTROBAN) 2 % Apply topically 3 (three) times daily.           sulfamethoxazole-trimethoprim (BACTRIM DS) 800-160 MG per tablet Take 2 tablets by mouth 2 (two) times daily.              Consults:  None  Labs: CBC  Lab 12/02/11 0528 12/01/11 1807 12/01/11 1746  WBC 8.4 -- 10.5  HGB 13.2 15.0 14.0  HCT 40.4 44.0 41.0  PLT 256 -- 255   BMET  Lab 12/02/11 0528 12/01/11 1940 12/01/11 1807  NA 133* 136 136  K 3.8 3.8 4.0  CL 94* 98 96  CO2 30 29 --  BUN 9 9 9   CREATININE 0.41* 0.41* 0.50  CALCIUM 8.7 8.8 --  PROT -- -- --  BILITOT -- -- --  ALKPHOS -- -- --  ALT -- -- --  AST -- -- --  GLUCOSE 413* 381* 430*     Procedures/Imaging:  Ct Abdomen Pelvis W Contrast  12/01/2011  IMPRESSION:  1.  No evidence of abscess. 2.  Diffuse soft tissue edema along the patient's pannus, with associated skin thickening, compatible with panniculitis. 3.  Stable right adrenal adenoma again noted.  Original Report Authenticated By: Tonia Ghent, M.D.     Brief Hospital Course: 35 yo F with PMH of morbid obesity and DM admitted with abscess of the lower abdomen and cellulitis.  1. Abscess- Lower abdomen  (pannus) superficial abscess with cellulitis. Afebrile, WBC improved.   - Was on Vanc and Zosyn for three days and changed to Septra high dose for 10 days. Pt had a vulvar abscess positive for MRSA recently. This is most likely the same pathogen. The abscess has been drainig serous fluid (non purulent) could not culture. D/C home  also on topical treatment with Bactroban.  2. DM-. Last A1C was 13 in August 2012. Uncontrolled here on the 400's to mid 300's her infection could be aggravating this. Recommended to restart home therapy with insuline regimen as soon as she gets home. (Pt takes at home 70 Lantus BID  and Novolog 20-20-30 withy meals) 3. HTN- Continued on home regimen of Coreg and Lisinopril BP controlled. 4. HLD- Continued home medication  5. COPD- Patient is not currently smoking.  - While in the Continue home inhalers of Symbicort daily and Albuterol PRN and Claritin daily. No exacerbations. 6. Depression- Continue Zoloft daily  7. Morbid obesity: Patient back to her baseline of limited mobilization.   Patient condition at time of discharge/disposition:  Patient is discharge home on stable medical condition.   Follow up issue: 1. Cellulitis/ superficial abscess of her pannus improvement.  Discharge follow up: Discharge Orders    Future Appointments: Provider: Department: Dept Phone: Center:   12/09/2011 10:30 AM Clementeen Graham [facility] Res[facility] [phone] [facility]     Future Orders Please Complete By Expires   Diet - low sodium heart healthy      Discharge wound care:      Comments:   Clean area with soap and lukewarm water. Apply Bactroban twice a day.      D. [place] Rolene Arbour, MD  [name] 12/03/2011

## 2011-12-03 NOTE — Progress Notes (Signed)
@   1:12 Placed pt. On CPAP, via Full Face Mask, on 14.0 cm H20 (per pt. Home settings). Pt. Tolerating well at this time.

## 2011-12-03 NOTE — Discharge Summary (Signed)
I saw and examined earlier today - please see my note.  I agree with the DC management and documentation by Dr. Aviva Signs.

## 2011-12-03 NOTE — Progress Notes (Signed)
Assisted patient with filling out her Living Will and Healthcare Power of [address]. Left phone message for Lovette Cliche in Social Work to [employer] forms. Patient is being discharged today. No follow up is needed.

## 2011-12-03 NOTE — Progress Notes (Signed)
Patient's Advanced Directive completed today by Charolette Forward, Pastoral Care.  This Child psychotherapist provided [employer] and witnesses. Patient was very pleased. She was d/c'd home today with friend. She denied any further SW needs.  Copy placed on chart; 2 copies given to patient along with original.  Darylene Price, BSW, 12/03/2011 4:28 PM

## 2011-12-03 NOTE — Telephone Encounter (Signed)
Pt hasn't heard anything and she needs this ASAP.  She has been in hospital and is going home today.  Will need it for tonight.

## 2011-12-03 NOTE — Telephone Encounter (Signed)
Called [facility] and spoke with [place]. Re: c-pap machine. We need to fax Rx to [facility]  # [phone] for Replacement unit for c-pap machine Auto setting 5-20 Will ask Dr.Neal for RX and fax it to [facility]. Lorenda Hatchet, Renato Battles

## 2011-12-03 NOTE — Telephone Encounter (Signed)
Paged Dr.Corey for Rx .Arlyss Repress

## 2011-12-03 NOTE — Progress Notes (Addendum)
CM notified by pt RN that pt may have difficulty obtaining meds this weekend and over holiday. This CM spoke with pt who explained that she gets meds from [facility], that pharmacy delivers her meds but must have the faxed prescription early in the day to make the delivery, they are not available over the weekend or on the holiday.  This CM asked pt RN to speak to pt MD re need for prescriptions to be ordered as soon as possible and CM will fax to pt pharmacy.  Starlyn Skeans RN MPH Case Manager [phone] Spoke with rep @ [facility] re delivery of these meds and was informed that normal turn around is 1-2 days, there is no weekend or holiday delivery. They recommend that pt fill meds that are needed immediately at a local [facility] or [facility]. Pt RN informed. Will continue to follow. Johny Shock RN MPH

## 2011-12-03 NOTE — Telephone Encounter (Signed)
[name] has called back twice to see if the order was faxed to [facility] for the CPAP machine.  She said she just called and they said they have not receive any fax yet.

## 2011-12-05 NOTE — ED Provider Notes (Signed)
Medical screening examination/treatment/procedure(s) were conducted as a shared visit with non-physician practitioner(s) and myself.  I personally evaluated the patient during the encounter  Hurman Horn, MD 12/05/11 2125

## 2011-12-09 ENCOUNTER — Ambulatory Visit (INDEPENDENT_AMBULATORY_CARE_PROVIDER_SITE_OTHER): Payer: Medicaid Other | Admitting: Family Medicine

## 2011-12-09 ENCOUNTER — Encounter: Payer: Self-pay | Admitting: Family Medicine

## 2011-12-09 VITALS — BP 148/91 | HR 94 | Temp 97.9°F | Ht 62.0 in | Wt 378.0 lb

## 2011-12-09 DIAGNOSIS — IMO0002 Reserved for concepts with insufficient information to code with codable children: Secondary | ICD-10-CM

## 2011-12-09 DIAGNOSIS — L0291 Cutaneous abscess, unspecified: Secondary | ICD-10-CM

## 2011-12-09 DIAGNOSIS — IMO0001 Reserved for inherently not codable concepts without codable children: Secondary | ICD-10-CM

## 2011-12-09 DIAGNOSIS — E1165 Type 2 diabetes mellitus with hyperglycemia: Secondary | ICD-10-CM

## 2011-12-09 MED ORDER — INSULIN GLARGINE 100 UNIT/ML ~~LOC~~ SOLN: 80.0000 [IU] | Freq: Two times a day (BID) | SUBCUTANEOUS | Status: DC

## 2011-12-09 MED ORDER — INSULIN ASPART 100 UNIT/ML ~~LOC~~ SOLN: 30.0000 [IU] | Freq: Three times a day (TID) | SUBCUTANEOUS | Status: DC

## 2011-12-09 NOTE — Telephone Encounter (Signed)
Fwd. To Dr.Corey. .Aceton Kinnear  

## 2011-12-09 NOTE — Patient Instructions (Signed)
Thank you for coming in today. Complete your antibiotics. Increase Lantus to 80 units twice a day. Increase Novolog to 30 units at breakfast, 30 units at lunch, and remain at 30 units with dinner. NO DRINKS WITH SUGAR. See me in 1 month.

## 2011-12-10 NOTE — Progress Notes (Signed)
[name] is a 36 year old woman who is morbidly obese and visits to the clinic today to followup her recent hospitalization for Panniculus abscess and uncontrolled diabetes.  1) discharged with Septra antibiotics she has several days left. She feels much better no longer experiencing pain or discharge.  No fevers or chills.  2) diabetes is poorly controlled with glucoses usually in the 200s to 300s. Currently taking Lantus 70 units twice daily and NovoLog 20 units at breakfast and lunch and 30 units at dinner.  No hypoglycemic episodes. Diet remains very poor and high in carbohydrates.  PMH reviewed.  ROS as above otherwise neg Medications reviewed. Current Outpatient Prescriptions  Medication Sig Dispense Refill  . ACCU-CHEK AVIVA PLUS test strip USE AS DIRECTED FOUR TIMES A DAY BEFORE MEALS AND AT BEDTIME  200 each  PRN  . albuterol (VENTOLIN HFA) 108 (90 BASE) MCG/ACT inhaler Inhale 2 puffs into the lungs every 4 (four) hours as needed for wheezing.  1 Inhaler  6  . budesonide-formoterol (SYMBICORT) 160-4.5 MCG/ACT inhaler Inhale 2 puffs into the lungs 2 (two) times daily.  1 Inhaler  12  . carvedilol (COREG) 6.25 MG tablet Take 6.25 mg by mouth 2 (two) times daily with a meal.        . cetirizine (ZYRTEC) 10 MG tablet Take 10 mg by mouth daily.        . diclofenac sodium (VOLTAREN) 1 % GEL Apply 1 application topically 4 (four) times daily.  100 g  3  . glimepiride (AMARYL) 4 MG tablet Take 4 mg by mouth daily before breakfast.        . insulin aspart (NOVOLOG FLEXPEN) 100 UNIT/ML injection Inject 30 Units into the skin 3 (three) times daily before meals. Take 20 units at breakfast and lunch. Take 30 units at dinner  10 pen  12  . insulin glargine (LANTUS) 100 UNIT/ML injection Inject 80 Units into the skin 2 (two) times daily.  50 mL  12  . lisinopril (PRINIVIL,ZESTRIL) 10 MG tablet Take 10 mg by mouth daily.        . meloxicam (MOBIC) 15 MG tablet Take 15 mg by mouth daily.        .  mupirocin cream (BACTROBAN) 2 % Apply topically 3 (three) times daily.  15 g  0  . nystatin (MYCOSTATIN) powder Apply topically daily.        . pantoprazole (PROTONIX) 40 MG tablet Take 40 mg by mouth daily.        . sertraline (ZOLOFT) 50 MG tablet Take 50 mg by mouth daily. Per [facility]       . sulfamethoxazole-trimethoprim (BACTRIM DS) 800-160 MG per tablet Take 2 tablets by mouth 2 (two) times daily.  40 tablet  0  . traMADol (ULTRAM) 50 MG tablet Take 50 mg by mouth 2 (two) times daily as needed. For breakthru pain       . zolpidem (AMBIEN) 5 MG tablet Take 5 mg by mouth at bedtime as needed.          Exam:  BP 148/91  Pulse 94  Temp(Src) 97.9 F (36.6 C) (Oral)  Ht 5\' 2"  (1.575 m)  Wt 378 lb (171.46 kg)  BMI 69.14 kg/m2 Gen: Well NAD, morbidly obese HEENT: EOMI,  MMM Lungs: CTABL Nl WOB Heart: RRR no MRG Abd: NABS, NT, ND Skin: Abscess on lower Panniculus/ upper mons pubis resolving. No surrounding erythema.

## 2011-12-10 NOTE — Assessment & Plan Note (Signed)
Doing much better today on Septra.  Plan to continue antibiotics until the complete the course. Followup in one month.

## 2011-12-10 NOTE — Assessment & Plan Note (Signed)
Her diabetes remained severely uncontrolled.  This is almost certainly a combination of severe insulin resistance and excessive caloric intake.  In the past we've tried to reduce the amount of carbohydrates she is being but that has not worked obviously.  Plan to increase insulin by about 20% today.  Plan to increase Lantus to 80 units twice a day.  Plan to increase NovoLog to 30 units at every meal.  We'll follow this up in one month

## 2011-12-10 NOTE — Assessment & Plan Note (Signed)
>>  ASSESSMENT AND PLAN FOR INSULIN DEPENDENT TYPE 2 DIABETES MELLITUS ([name]) WRITTEN ON 12/10/2011  8:39 AM BY COREY, EVAN S, MD  Her diabetes remained severely uncontrolled.  This is almost certainly a combination of severe insulin resistance and excessive caloric intake.  In the past we've tried to reduce the amount of carbohydrates she is being but that has not worked obviously.  Plan to increase insulin by about 20% today.  Plan to increase Lantus to 80 units twice a day.  Plan to increase NovoLog to 30 units at every meal.  We'll follow this up in one month

## 2012-01-01 IMAGING — CR DG ANKLE COMPLETE 3+V*L*
3 series · 3 of 3 positions shown · non-contrast
Comparison: None

CLINICAL DATA: Injured left ankle.

LEFT ANKLE COMPLETE - 3+ VIEW

[t ankle joint ap left]
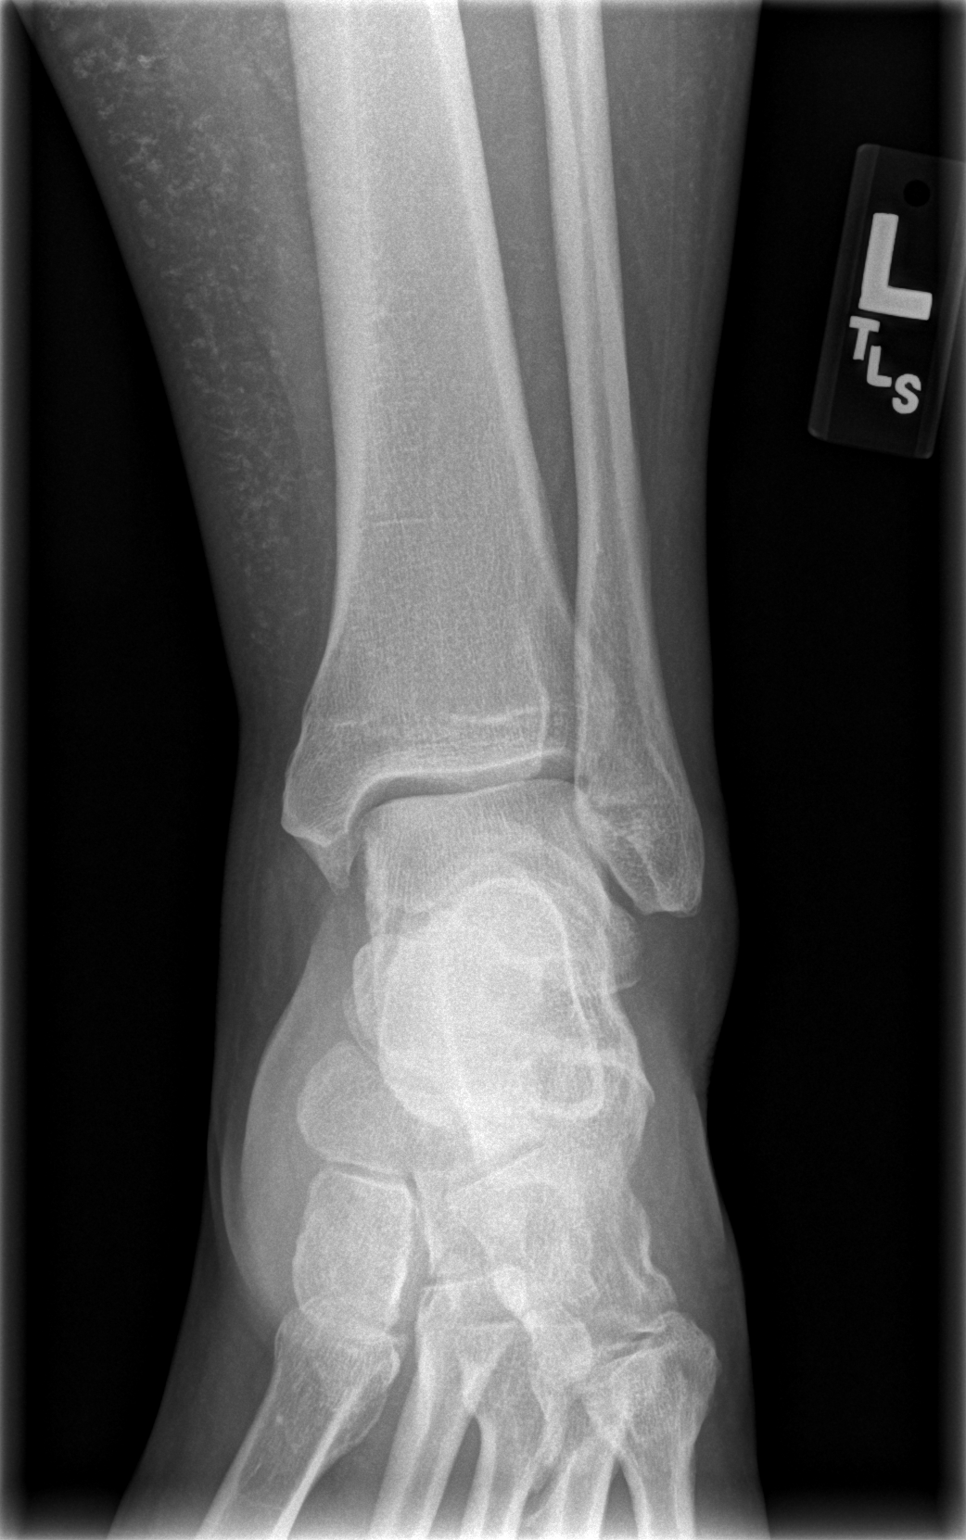

[t ankle joint oblique left]
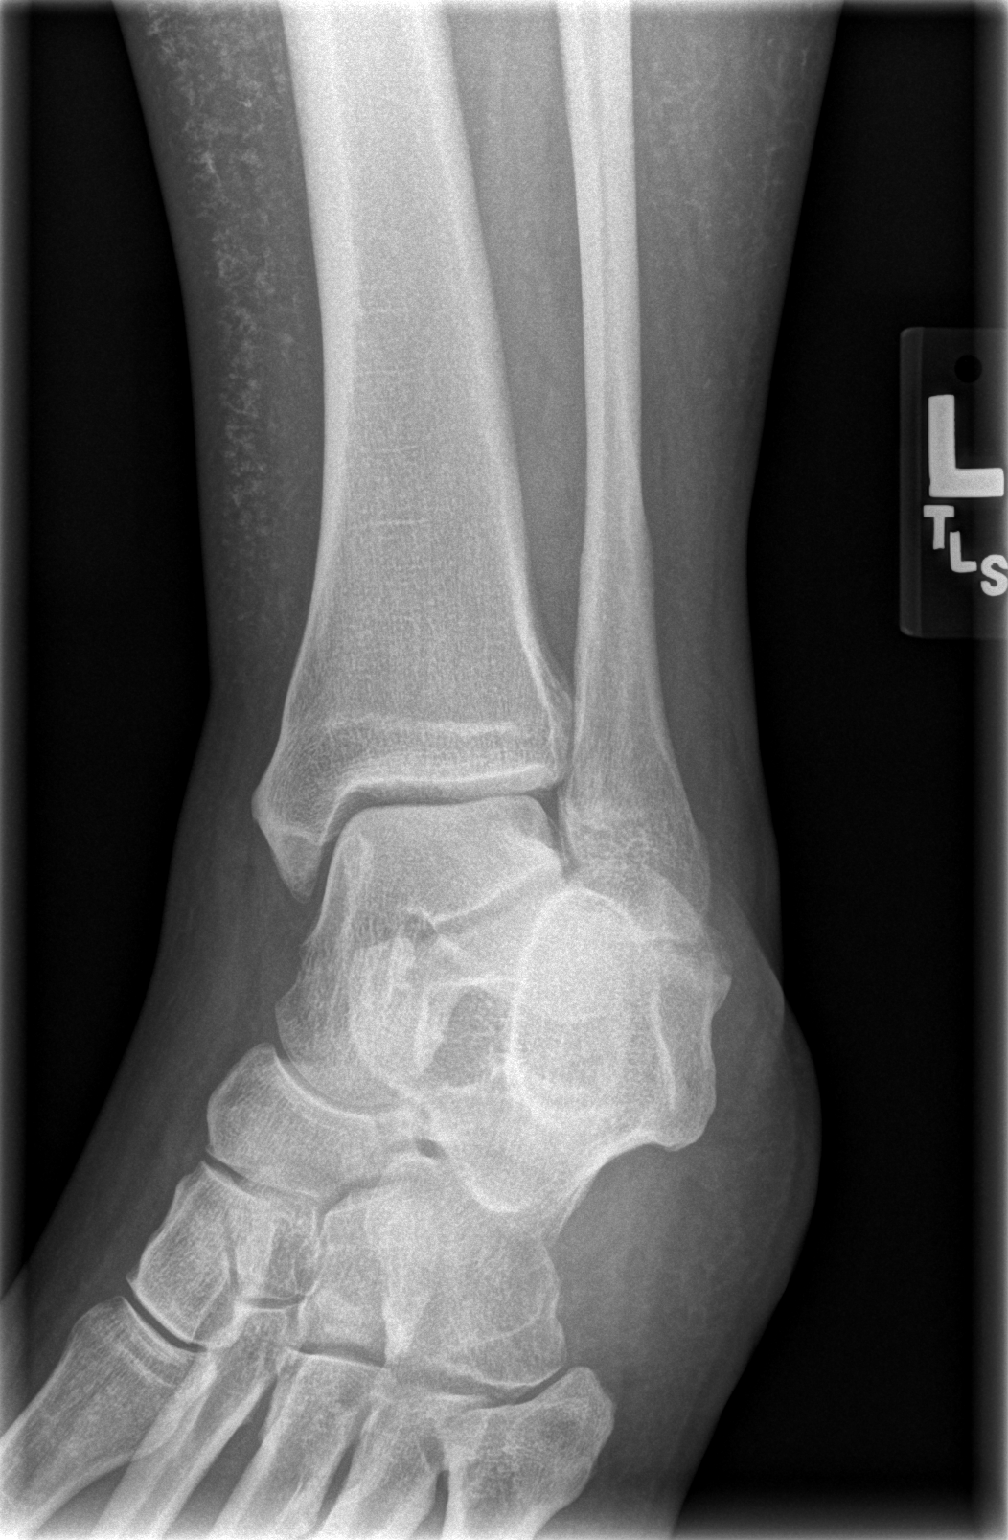

[t ankle joint lat left]
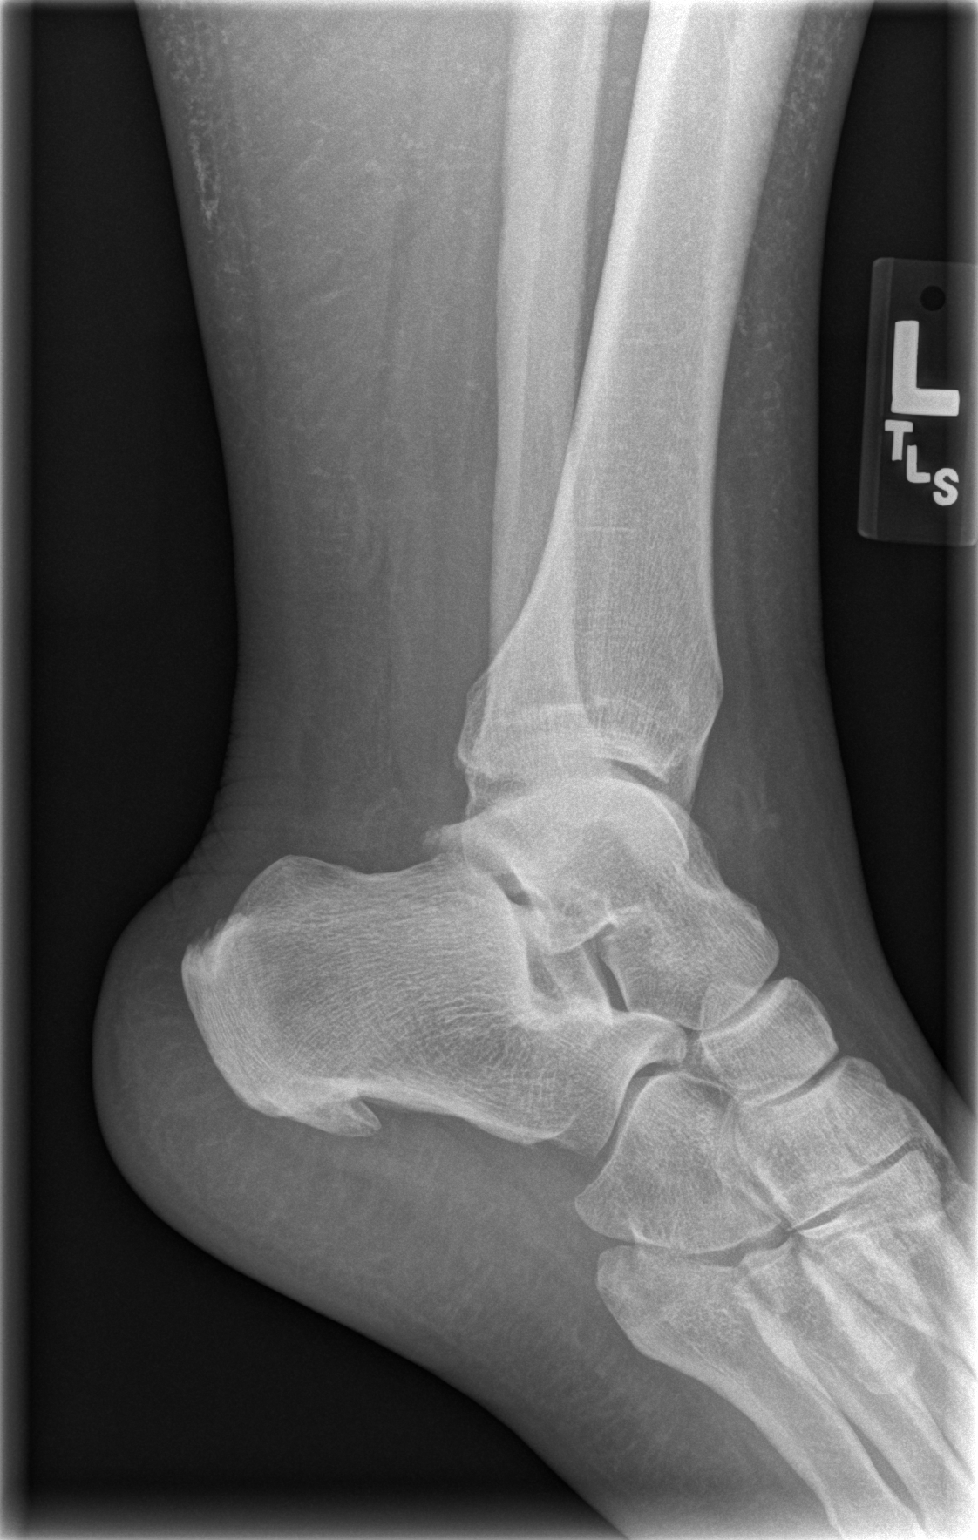

[3 of 3 positions shown; findings below may reference images not displayed]

FINDINGS: The ankle mortise is maintained.  No acute fracture or
osteochondral abnormality.  The mid and hind foot bony structures
appear normal.  A large calcaneal heel spur is noted.

Diffuse soft tissue calcification involving the lower left leg may
be due to collagen vascular disease, dermatomyositis or
polymyositis.
IMPRESSION: No acute bony findings.

## 2012-01-01 IMAGING — CR DG FOOT COMPLETE 3+V*L*
3 series · 3 of 3 positions shown · non-contrast
Comparison: None

CLINICAL DATA: Injured left foot.

LEFT FOOT - COMPLETE 3+ VIEW

[t foot lat left]
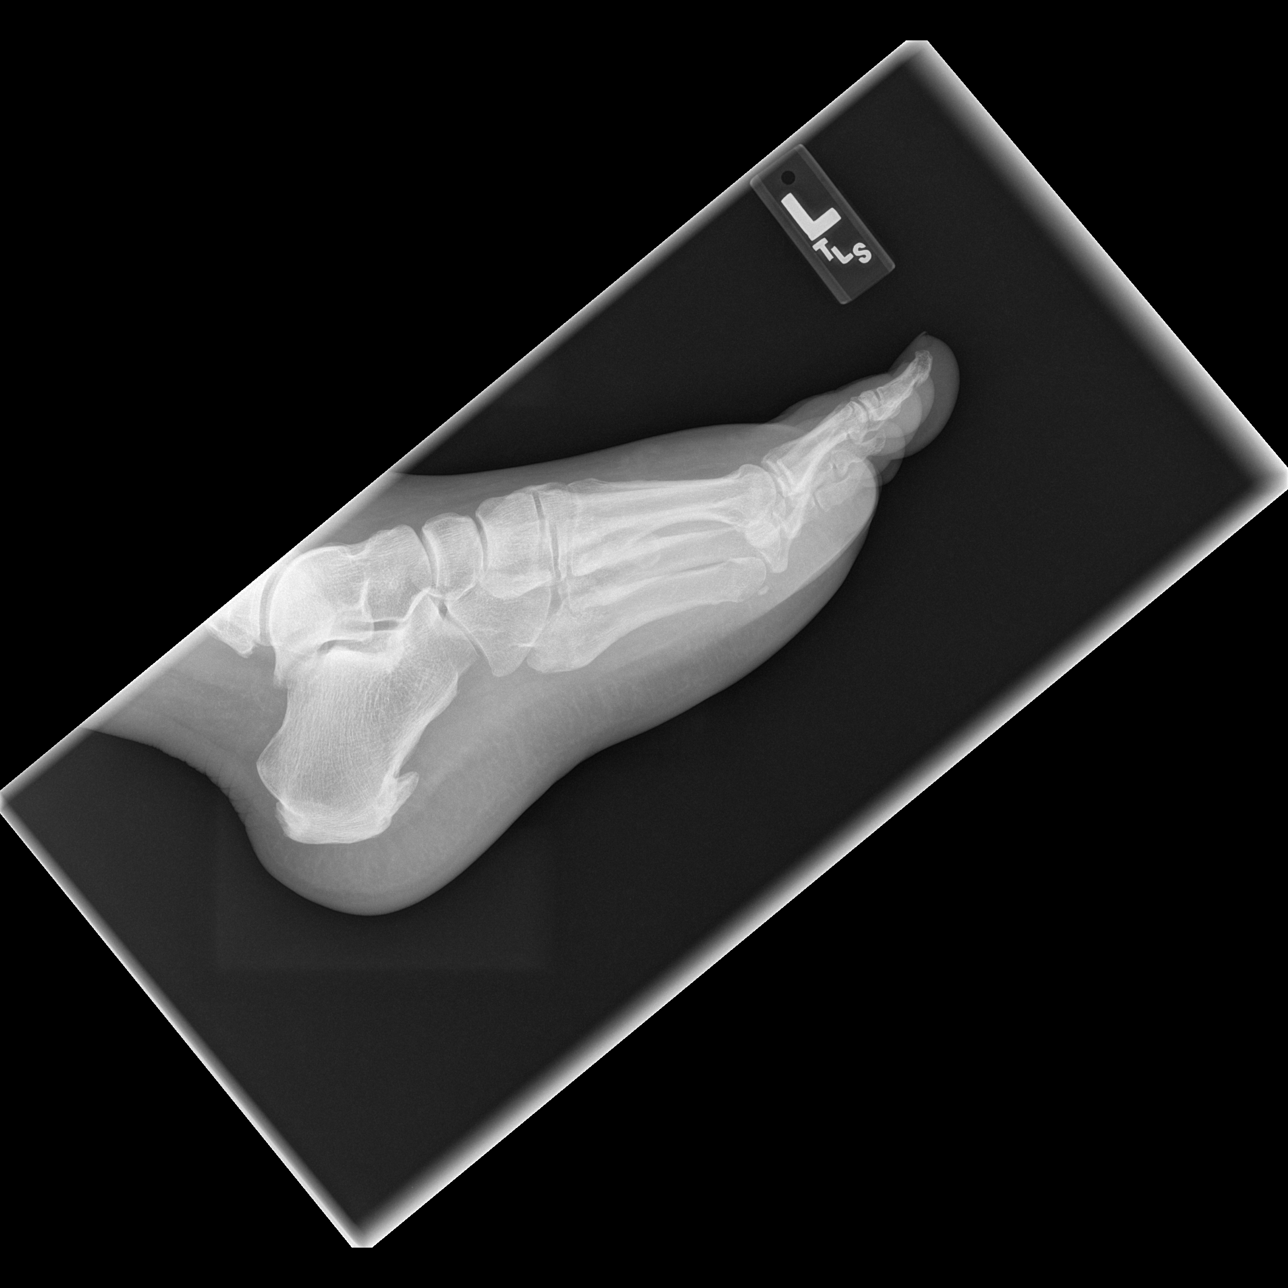

[t foot ap left]
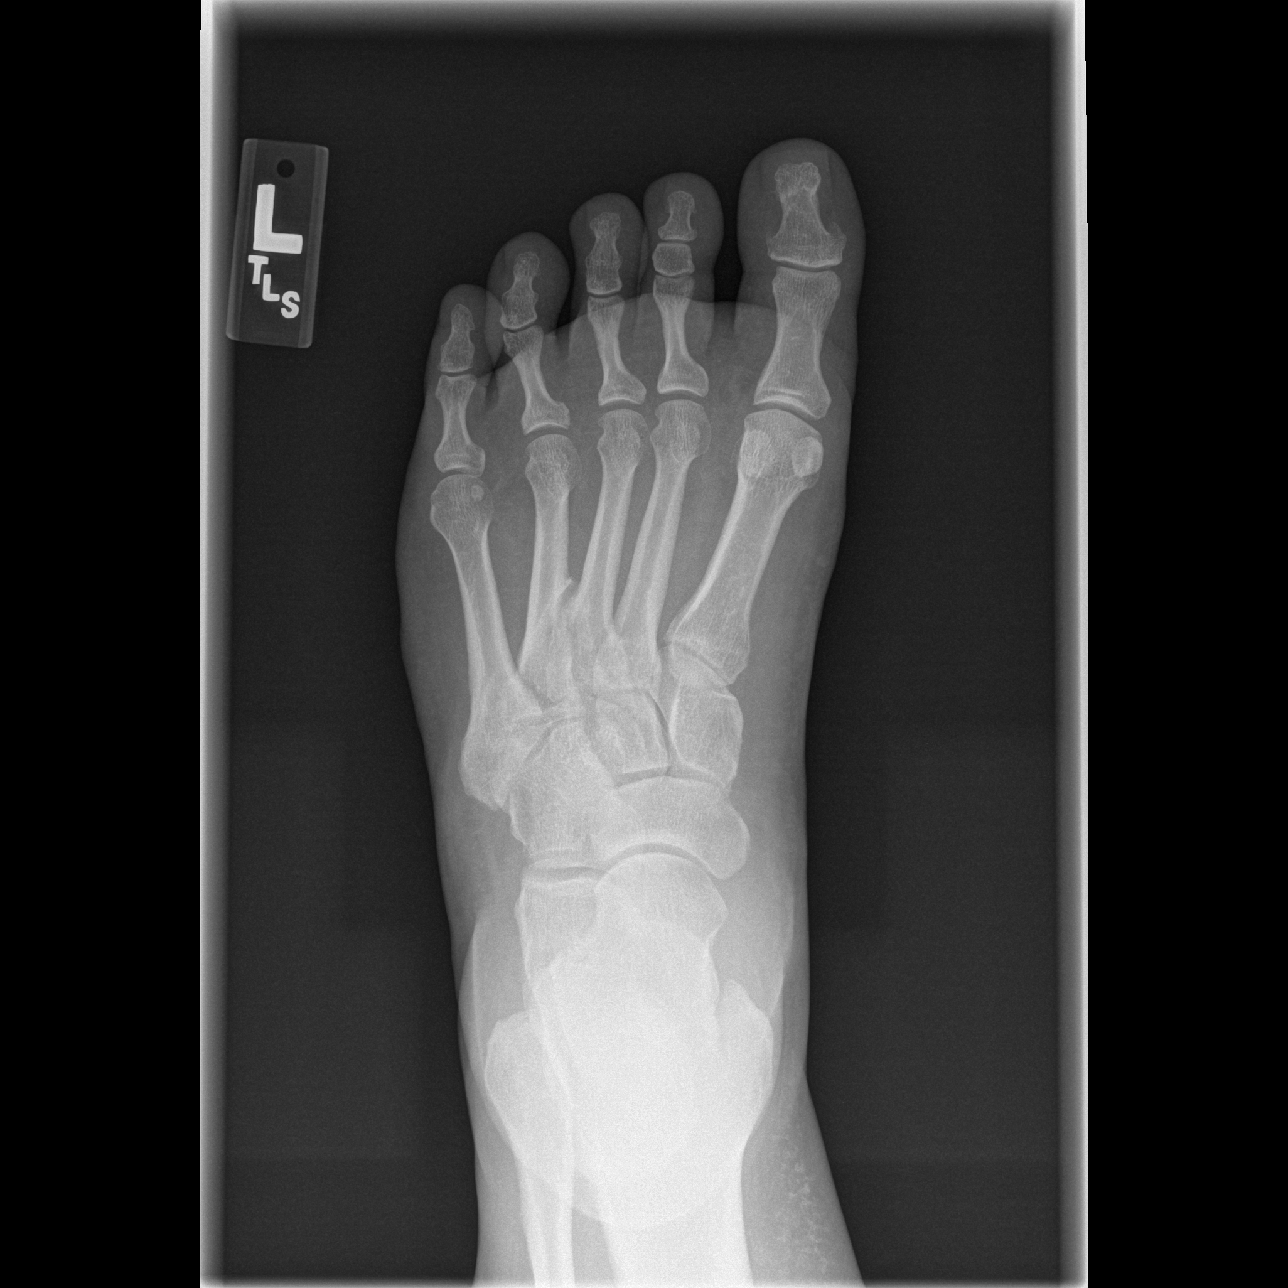

[t foot oblique left]
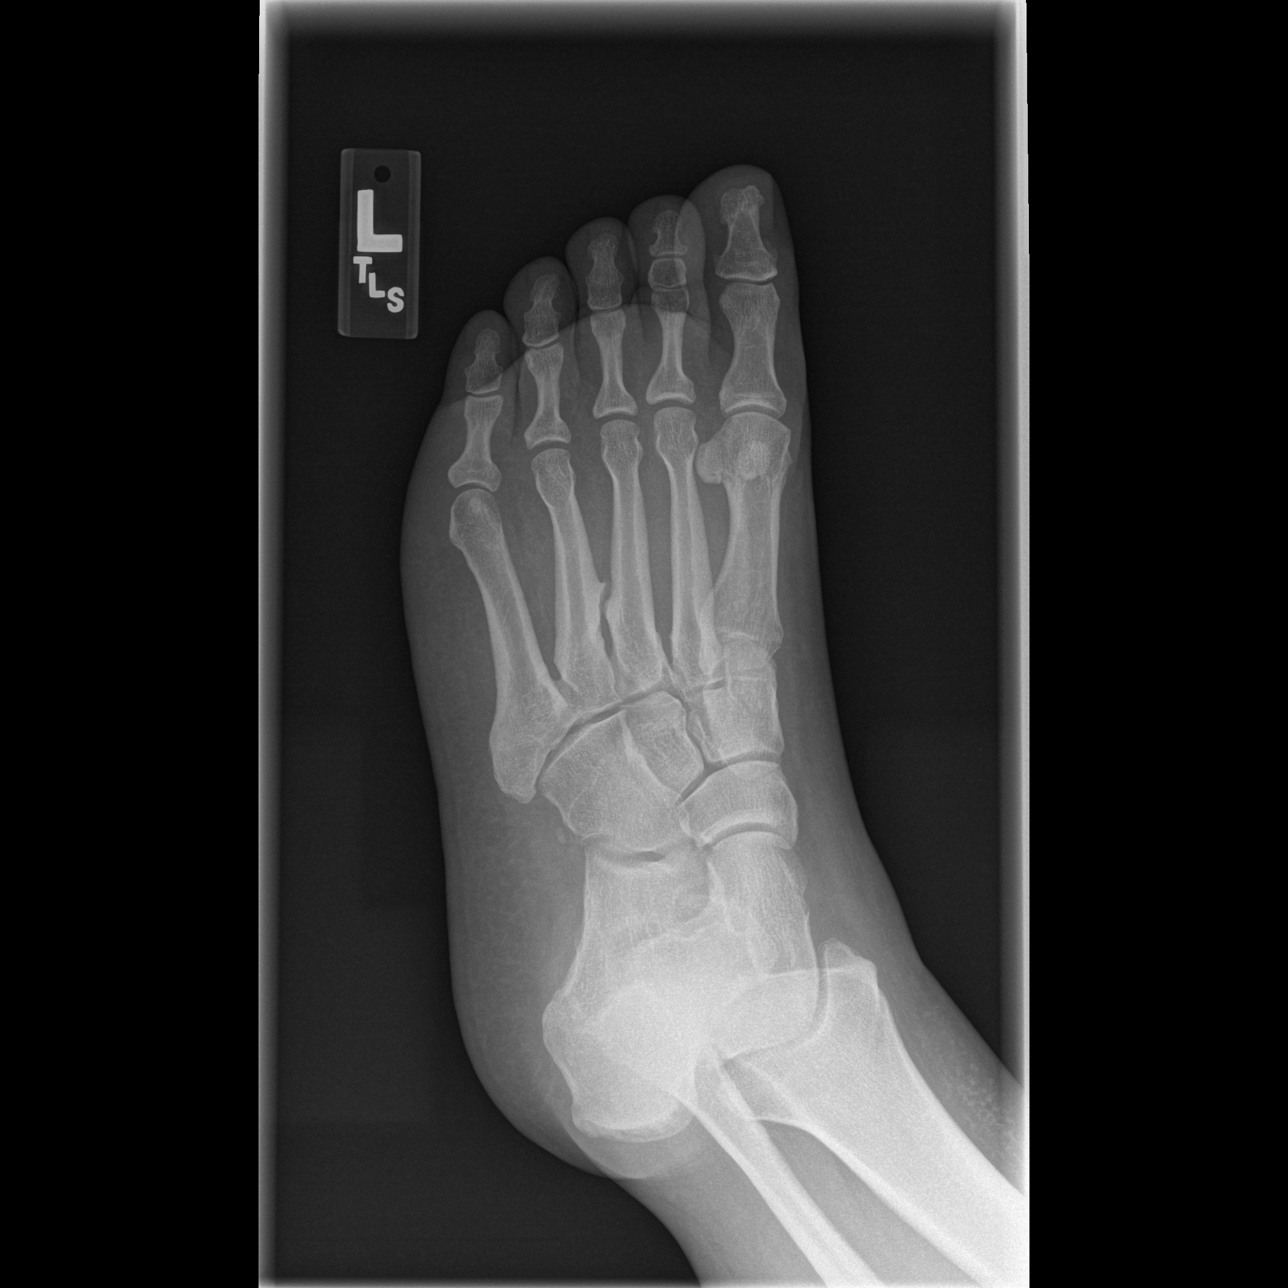

[3 of 3 positions shown; findings below may reference images not displayed]

FINDINGS: The joint spaces are maintained.  No acute fracture.
Large calcaneal heel spur is noted.  Mild spurring between the
third and fourth metatarsals is noted.
IMPRESSION: No acute bony findings.

## 2012-01-05 ENCOUNTER — Other Ambulatory Visit: Payer: Self-pay | Admitting: Family Medicine

## 2012-01-05 NOTE — Telephone Encounter (Signed)
Refill request

## 2012-01-10 ENCOUNTER — Ambulatory Visit: Payer: Medicaid Other | Admitting: Family Medicine

## 2012-01-11 ENCOUNTER — Other Ambulatory Visit: Payer: Self-pay | Admitting: Family Medicine

## 2012-01-11 NOTE — Telephone Encounter (Signed)
Refill request

## 2012-02-01 ENCOUNTER — Other Ambulatory Visit: Payer: Self-pay | Admitting: Family Medicine

## 2012-02-02 NOTE — Telephone Encounter (Signed)
Refill request

## 2012-02-08 ENCOUNTER — Other Ambulatory Visit: Payer: Self-pay | Admitting: Family Medicine

## 2012-02-08 NOTE — Telephone Encounter (Signed)
Refill request

## 2012-02-21 ENCOUNTER — Ambulatory Visit: Payer: Medicaid Other | Admitting: Family Medicine

## 2012-02-29 ENCOUNTER — Telehealth: Payer: Self-pay | Admitting: Family Medicine

## 2012-02-29 NOTE — Telephone Encounter (Signed)
Patient started bleeding from her rectum today.  She has a history of rectal fissures noted in 06/2011. She has seen a gastroenterologist in the past for this problem.  She denies dizziness/lightheadedness. She is having some pain in her bottom but it is tolerable.   Recommending she call tomorrow morning and ask to be seen in clinic to be evaluated. Patient amenable to plan.

## 2012-03-07 ENCOUNTER — Other Ambulatory Visit: Payer: Self-pay | Admitting: Family Medicine

## 2012-03-14 ENCOUNTER — Telehealth: Payer: Self-pay | Admitting: Family Medicine

## 2012-03-14 NOTE — Telephone Encounter (Signed)
Patient called emergency line complaining of chest pain x3 days, waxing and waning, mostly in the center and to the right of her chest.  Pain has gotten worse tonight and she is wondering what she should do.  Has HTN, DMII, obesity.  Having some SOB associated with it.  No diaphoresis, not on the left side, no radiation down arm or up neck.  Has not tried anything for it.  Activity does not make it better or worse.  Advised pt to come to ED for chest pain evaluation, although this is unlikely to be cardiac in nature, cannot r/o MI over the phone.

## 2012-03-15 ENCOUNTER — Ambulatory Visit (HOSPITAL_COMMUNITY)
Admission: RE | Admit: 2012-03-15 | Discharge: 2012-03-15 | Disposition: A | Discharge status: Home / Self Care | Payer: Medicaid Other | Source: Ambulatory Visit | Attending: Family Medicine | Admitting: Family Medicine

## 2012-03-15 ENCOUNTER — Ambulatory Visit (INDEPENDENT_AMBULATORY_CARE_PROVIDER_SITE_OTHER): Payer: Medicaid Other | Admitting: Family Medicine

## 2012-03-15 VITALS — BP 144/83 | HR 103 | Ht 62.0 in | Wt 386.0 lb

## 2012-03-15 DIAGNOSIS — IMO0001 Reserved for inherently not codable concepts without codable children: Secondary | ICD-10-CM

## 2012-03-15 DIAGNOSIS — E1165 Type 2 diabetes mellitus with hyperglycemia: Secondary | ICD-10-CM

## 2012-03-15 DIAGNOSIS — R079 Chest pain, unspecified: Secondary | ICD-10-CM | POA: Insufficient documentation

## 2012-03-15 DIAGNOSIS — IMO0002 Reserved for concepts with insufficient information to code with codable children: Secondary | ICD-10-CM

## 2012-03-15 LAB — BASIC METABOLIC PANEL
BUN: 9 mg/dL (ref 6–23)
Chloride: 97 mEq/L (ref 96–112)
Glucose, Bld: 420 mg/dL — ABNORMAL HIGH (ref 70–99)
Potassium: 4.2 mEq/L (ref 3.5–5.3)
Sodium: 134 mEq/L — ABNORMAL LOW (ref 135–145)

## 2012-03-15 LAB — CBC
HCT: 45.2 % (ref 36.0–46.0)
MCH: 31.1 pg (ref 26.0–34.0)
MCV: 95.8 fL (ref 78.0–100.0)
Platelets: 272 10*3/uL (ref 150–400)
RBC: 4.72 MIL/uL (ref 3.87–5.11)

## 2012-03-15 LAB — POCT GLYCOSYLATED HEMOGLOBIN (HGB A1C): Hemoglobin A1C: >14

## 2012-03-15 NOTE — Assessment & Plan Note (Signed)
Very poorly controlled diabetes.  [name] is essentially completely noncompliant with her insulin.  I have asked her to check her blood sugar when she feels bad and report the value.  I think the most likely thing is her blood sugar is probably in the 150s and this makes her feel relatively hypoglycemic. I explained that this feeling will likely pass after a week or so of good blood glucose control.  However if her blood sugars in the 60s or 70s she is truly hypoglycemic and would require modification of her insulin regimen.  Instructed her to resume her Lantus and NovoLog and follow blood sugars and come back in one week. She expresses understanding.

## 2012-03-15 NOTE — Assessment & Plan Note (Signed)
>>  ASSESSMENT AND PLAN FOR INSULIN DEPENDENT TYPE 2 DIABETES MELLITUS ([name]) WRITTEN ON 03/15/2012 11:03 AM BY COREY, Michel Harrow, MD  Very poorly controlled diabetes.  [name] is essentially completely noncompliant with her insulin.  I have asked her to check her blood sugar when she feels bad and report the value.  I think the most likely thing is her blood sugar is probably in the 150s and this makes her feel relatively hypoglycemic. I explained that this feeling will likely pass after a week or so of good blood glucose control.  However if her blood sugars in the 60s or 70s she is truly hypoglycemic and would require modification of her insulin regimen.  Instructed her to resume her Lantus and NovoLog and follow blood sugars and come back in one week. She expresses understanding.

## 2012-03-15 NOTE — Progress Notes (Signed)
[name] is a 36 y.o. female who presents to [facility] today for   1) diabetes followup: Patient has been abstaining from her usual diabetes regimen do to a weird feeling that gives her. She describes fatigue and lightheadedness while taking insulin. She has never checked her blood sugar well she feels bad. She notes that her usual blood sugars while on insulin are in the 300 range with a max of 400 and a minimum of 250.  She notes polyuria and polydipsia.  She is not following a diabetic diet.  2) chest: Has noted mild substernal chest pain off and on for the past week. She is preparing to move and has been boxing things up recently. The pain is nonradiating substernal and sometimes exertional but often nonexertional. Sometimes better with rest sometimes better on its own. Pain is intermittent and lasts less than a few minutes. It also is worse with deep inspiration.  She has no past history of coronary artery disease and had a normal chest pain rule out in June of 2012.    3) obesity: Is not following a diabetic diet. She is interested in the lap band procedure.   Additionally she inquires about a bedside commode. She often has difficulty reaching the bathroom in time due to her morbid obesity and limited mobility.   PMH, SH reviewed: Significant for morbid obesity diabetes and hypertension ROS as above otherwise neg. No , palpitations, SOB, Fever, Chills, Abd pain, N/V/D.  Medications reviewed. Current Outpatient Prescriptions  Medication Sig Dispense Refill  . ACCU-CHEK AVIVA PLUS test strip USE AS DIRECTED FOUR TIMES A DAY BEFORE MEALS AND AT BEDTIME  200 each  PRN  . albuterol (VENTOLIN HFA) 108 (90 BASE) MCG/ACT inhaler Inhale 2 puffs into the lungs every 4 (four) hours as needed for wheezing.  1 Inhaler  6  . carvedilol (COREG) 6.25 MG tablet Take 6.25 mg by mouth 2 (two) times daily with a meal.        . cetirizine (ZYRTEC) 10 MG tablet Take 10 mg by mouth daily.        . diclofenac  sodium (VOLTAREN) 1 % GEL Apply 1 application topically 4 (four) times daily.  100 g  3  . glimepiride (AMARYL) 4 MG tablet Take 4 mg by mouth daily before breakfast.        . insulin aspart (NOVOLOG FLEXPEN) 100 UNIT/ML injection Inject 30 Units into the skin 3 (three) times daily before meals. Take 20 units at breakfast and lunch. Take 30 units at dinner  10 pen  12  . insulin glargine (LANTUS) 100 UNIT/ML injection Inject 80 Units into the skin 2 (two) times daily.  50 mL  12  . lisinopril (PRINIVIL,ZESTRIL) 10 MG tablet Take 10 mg by mouth daily.        . mupirocin ointment (BACTROBAN) 2 % APPLY TO AFFECTED AREA THREE TIMES A DAY  30 g  6  . nystatin (MYCOSTATIN) powder Apply topically daily.        . pantoprazole (PROTONIX) 40 MG tablet Take 40 mg by mouth daily.        . SYMBICORT 160-4.5 MCG/ACT inhaler INHALE 2 PUFFS INTO THE LUNGS 2 TIMES DAILY  1 Inhaler  99  . zolpidem (AMBIEN) 5 MG tablet Take 5 mg by mouth at bedtime as needed.        . meloxicam (MOBIC) 15 MG tablet Take 15 mg by mouth daily.        . sertraline (  ZOLOFT) 50 MG tablet Take 50 mg by mouth daily. Per [facility]       . traMADol (ULTRAM) 50 MG tablet Take 50 mg by mouth 2 (two) times daily as needed. For breakthru pain       . DISCONTD: glimepiride (AMARYL) 4 MG tablet TAKE ONE TABLET BY MOUTH EVERY DAY  30 tablet  PRN  . DISCONTD: lisinopril (PRINIVIL,ZESTRIL) 10 MG tablet TAKE ONE TABLET BY MOUTH EVERY DAY  30 tablet  PRN  . DISCONTD: nystatin (MYCOSTATIN) powder APPLY TO AFFECTED AREA TWICE DAILY  60 g  12    Exam:  BP 144/83  Pulse 103  Ht 5\' 2"  (1.575 m)  Wt 386 lb (175.088 kg)  BMI 70.60 kg/m2  SpO2 91% Gen: Well NAD, morbidly obesity HEENT: EOMI,  MMM Lungs: CTABL Nl WOB distant breath sounds Heart: RRR no MRG distant heart sounds Chest: Tender to palpation over the sternum. This reproduces exactly the quality and quantity of the pain she has been having Abd: NABS, NT, ND, morbidly obese Exts: Non  edematous BL  LE, warm and well perfused. Calf diameters equal bilaterally  Results for orders placed in visit on 03/15/12 (from the past 72 hour(s))  POCT GLYCOSYLATED HEMOGLOBIN (HGB A1C)     Status: Abnormal   Collection Time   03/15/12  9:06 AM      Component Value Range Comment   Hemoglobin A1C >14.0        EKG: Sinus tachycardia at 104 beats per minute no ST segment abnormalities otherwise normal

## 2012-03-15 NOTE — Patient Instructions (Signed)
Thank you for coming in today. Please restart your insulin.  Measure your blood sugar when you feel bad.  See me on Monday or Tuesday.  Call or go to the emergency room if you get worse, have trouble breathing, have chest pains, or racing heart.  Keep your phone handy.

## 2012-03-15 NOTE — Assessment & Plan Note (Addendum)
Obviously concerning however I don't think coronary artery disease or pulmonary embolism are most likely. Her Wells Score is 1.5 which puts her in a low risk population.  She does have a low oxygen saturation however review of her past oxygen saturation show that she is chronically low.  SpO2 Readings from Last 3 Encounters:  03/15/12 91%  12/03/11 93%  10/18/11 94%   This would indicate obesity hypoventilation syndrome and not acute pulmonary embolism.  Additionally an EKG today showed very mild sinus tachycardia without any other worrying changes.   However the consequences of and has pulmonary embolism is serious.  Therefore I feel that an initial evaluation with a d-dimer is warranted.  If positive will proceed with a chest CT angiogram or VQ scan.  Followup in one week or sooner if needed. Discussed warning signs or symptoms. Patient expresses understanding.  Additionally I brought up the possibility of going to the hospital for a more controlled evaluation.  She declined this option.  We discussed the risks and benefits of proceeding with a workup at home and she understands  Total face-to-face time was 45 minutes more than 50% of time was spent counseling. I consulted Dr. Leveda Anna in an course of the evaluation of this case.

## 2012-03-15 NOTE — Assessment & Plan Note (Signed)
Morbid obesity. [name] gains weight every visit.  I think she may be a candidate for bariatric surgery however her insulin would need to be better controlled prior to this. We'll follow this issue up in one week

## 2012-03-17 ENCOUNTER — Emergency Department (HOSPITAL_COMMUNITY): Payer: Medicaid Other

## 2012-03-17 ENCOUNTER — Other Ambulatory Visit: Payer: Self-pay

## 2012-03-17 ENCOUNTER — Encounter (HOSPITAL_COMMUNITY): Payer: Self-pay | Admitting: *Deleted

## 2012-03-17 ENCOUNTER — Emergency Department (HOSPITAL_COMMUNITY)
Admission: EM | Admit: 2012-03-17 | Discharge: 2012-03-17 | Disposition: A | Discharge status: Home / Self Care | Payer: Medicaid Other | Attending: Emergency Medicine | Admitting: Emergency Medicine

## 2012-03-17 DIAGNOSIS — G4734 Idiopathic sleep related nonobstructive alveolar hypoventilation: Secondary | ICD-10-CM | POA: Insufficient documentation

## 2012-03-17 DIAGNOSIS — R079 Chest pain, unspecified: Secondary | ICD-10-CM | POA: Insufficient documentation

## 2012-03-17 DIAGNOSIS — Z794 Long term (current) use of insulin: Secondary | ICD-10-CM | POA: Insufficient documentation

## 2012-03-17 DIAGNOSIS — R739 Hyperglycemia, unspecified: Secondary | ICD-10-CM

## 2012-03-17 DIAGNOSIS — K219 Gastro-esophageal reflux disease without esophagitis: Secondary | ICD-10-CM | POA: Insufficient documentation

## 2012-03-17 DIAGNOSIS — E119 Type 2 diabetes mellitus without complications: Secondary | ICD-10-CM | POA: Insufficient documentation

## 2012-03-17 DIAGNOSIS — F3189 Other bipolar disorder: Secondary | ICD-10-CM | POA: Insufficient documentation

## 2012-03-17 DIAGNOSIS — I10 Essential (primary) hypertension: Secondary | ICD-10-CM | POA: Insufficient documentation

## 2012-03-17 DIAGNOSIS — J45909 Unspecified asthma, uncomplicated: Secondary | ICD-10-CM | POA: Insufficient documentation

## 2012-03-17 DIAGNOSIS — M940 Chondrocostal junction syndrome [Tietze]: Secondary | ICD-10-CM

## 2012-03-17 LAB — CBC
Platelets: 265 10*3/uL (ref 150–400)
RDW: 12.9 % (ref 11.5–15.5)
WBC: 9.6 10*3/uL (ref 4.0–10.5)

## 2012-03-17 LAB — POCT I-STAT TROPONIN I

## 2012-03-17 LAB — POCT I-STAT, CHEM 8
Glucose, Bld: 440 mg/dL — ABNORMAL HIGH (ref 70–99)
HCT: 45 % (ref 36.0–46.0)
Hemoglobin: 15.3 g/dL — ABNORMAL HIGH (ref 12.0–15.0)
Potassium: 4.4 mEq/L (ref 3.5–5.1)
Sodium: 137 mEq/L (ref 135–145)

## 2012-03-17 LAB — D-DIMER, QUANTITATIVE: D-Dimer, Quant: 0.4 ug/mL-FEU (ref 0.00–0.48)

## 2012-03-17 MED ORDER — HYDROCODONE-ACETAMINOPHEN 5-325 MG PO TABS: 1.0000 | ORAL_TABLET | Freq: Four times a day (QID) | ORAL | Status: AC | PRN

## 2012-03-17 MED ORDER — SODIUM CHLORIDE 0.45 % IV BOLUS
1000.0000 mL | Freq: Once | INTRAVENOUS | Status: AC
  Administered 2012-03-17: 1000 mL via INTRAVENOUS

## 2012-03-17 MED ORDER — INSULIN ASPART 100 UNIT/ML ~~LOC~~ SOLN
10.0000 [IU] | Freq: Once | SUBCUTANEOUS | Status: AC
  Administered 2012-03-17: 10 [IU] via INTRAVENOUS

## 2012-03-17 MED ORDER — SODIUM CHLORIDE 0.9 % IV SOLN
20.0000 mL | INTRAVENOUS | Status: DC
  Administered 2012-03-17: 20 mL via INTRAVENOUS

## 2012-03-17 MED ORDER — MORPHINE SULFATE 4 MG/ML IJ SOLN
4.0000 mg | Freq: Once | INTRAMUSCULAR | Status: AC
  Administered 2012-03-17: 4 mg via INTRAVENOUS
  Filled 2012-03-17: qty 1

## 2012-03-17 MED ORDER — ASPIRIN 81 MG PO CHEW
324.0000 mg | CHEWABLE_TABLET | Freq: Once | ORAL | Status: AC
  Administered 2012-03-17: 324 mg via ORAL
  Filled 2012-03-17: qty 4

## 2012-03-17 NOTE — ED Notes (Signed)
She has had lt upper chest pain with lt arm radiation for 2 weeks. She saw her doctor thrusday for the same and had no diagnosis

## 2012-03-17 NOTE — Discharge Instructions (Signed)
Costochondritis  Costochondritis (Tietze syndrome), or costochondral separation, is a swelling and irritation (inflammation) of the tissue (cartilage) that connects your ribs with your breastbone (sternum). It may occur on its own (spontaneously), through damage caused by an accident (trauma), or simply from coughing or minor exercise. It may take up to 6 weeks to get better and longer if you are unable to be conservative in your activities.  HOME CARE INSTRUCTIONS    Avoid exhausting physical activity. Try not to strain your ribs during normal activity. This would include any activities using chest, belly (abdominal), and side muscles, especially if heavy weights are used.   Use ice for 15 to 20 minutes per hour while awake for the first 2 days. Place the ice in a plastic bag, and place a towel between the bag of ice and your skin.   Only take over-the-counter or prescription medicines for pain, discomfort, or fever as directed by your caregiver.  SEEK IMMEDIATE MEDICAL CARE IF:    Your pain increases or you are very uncomfortable.   You have a fever.   You develop difficulty with your breathing.   You cough up blood.   You develop worse chest pains, shortness of breath, sweating, or vomiting.   You develop new, unexplained problems (symptoms).  MAKE SURE YOU:    Understand these instructions.   Will watch your condition.   Will get help right away if you are not doing well or get worse.  Document Released: 09/01/2005 Document Revised: 11/11/2011 Document Reviewed: 07/10/2008  ExitCare Patient Information 2012 [employer]    Chest Wall Pain  Chest wall pain is pain felt in or around the chest bones and muscles. It may take up to 6 weeks to get better. It may take longer if you are active. Chest wall pain can happen on its own. Other times, things like germs, injury, coughing, or exercise can cause the pain.  HOME CARE    Avoid activities that make you tired or cause pain. Try not to use your  chest, belly (abdominal), or side muscles. Do not use heavy weights.   Put ice on the sore area.   Put ice in a plastic bag.   Place a towel between your skin and the bag.   Leave the ice on for 15 to 20 minutes for the first 2 days.   Only take medicine as told by your doctor.  GET HELP RIGHT AWAY IF:    You have more pain or are very uncomfortable.   You have a fever.   Your chest pain gets worse.   You have new problems.   You feel sick to your stomach (nauseous) or throw up (vomit).   You start to sweat or feel lightheaded.   You have a cough with mucus (phlegm).   You cough up blood.  MAKE SURE YOU:    Understand these instructions.   Will watch your condition.   Will get help right away if you are not doing well or get worse.  Document Released: 05/10/2008 Document Revised: 11/11/2011 Document Reviewed: 07/19/2011  Exit[employer] Patient Information 2012 [employer]

## 2012-03-17 NOTE — ED Notes (Signed)
Pt c/o sharp left chest pain onset 2 weeks ago, worse with movement, normally last hours.  Was seen by MD on THursday with negative CP work up and neg for "blood clots".  Pt is non radiating , denies nausea or vomiting.  Painful left achest to palpation.

## 2012-03-17 NOTE — ED Provider Notes (Signed)
History     CSN: [id number]  Arrival date & time 03/17/12  0055   First MD Initiated Contact with Patient 03/17/12 0108      Chief Complaint  Patient presents with  . Chest Pain    (Consider location/radiation/quality/duration/timing/severity/associated sxs/prior treatment) HPI Patient has been having pain in her chest off and on for the last 2 weeks. She states the pain will last from minutes to hours. It is located on the left side of her chest and does not radiate. It is primarily sharp in nature and increases with movement. She has not had any trouble with shortness of breath, nausea, vomiting, abdominal pain, or leg swelling. She has no history of pulmonary illness or myocardial infarction. There is no significant family history. The patient saw her Dr. this week and had some blood tests in the office. She was told that they were unremarkable. She was not given any prescriptions for medications to take for pain. Tonight the pain became more severe and she was told that if the symptoms increased to go to the emergency. Patient has a prior history of a cardiac rule out in June of 2012 as indicated in the notes I her primary Dr. Past Medical History  Diagnosis Date  . Morbid obesity   . Extreme obesity with alveolar hypoventilation   . Morbid obesity   . Alveolar hypoventilation   . Diabetes mellitus   . Asthma   . GERD (gastroesophageal reflux disease)   . Rectal fissure   . Hypertension   . Costochondritis   . Cellulitis 08/2010-08/2011  . Depression   . Bipolar 2 disorder     Past Surgical History  Procedure Date  . Carpal tunnel release   . Bil knee surgery     No family history on file.  History  Substance Use Topics  . Smoking status: Former Smoker    Quit date: 12/07/2003  . Smokeless tobacco: Never Used  . Alcohol Use: No    OB History    Grav Para Term Preterm Abortions TAB SAB Ect Mult Living                  Review of Systems  All other systems  reviewed and are negative.    Allergies  Nubain  Home Medications   Current Outpatient Rx  Name Route Sig Dispense Refill  . ALBUTEROL SULFATE HFA 108 (90 BASE) MCG/ACT IN AERS Inhalation Inhale 2 puffs into the lungs every 4 (four) hours as needed for wheezing. 1 Inhaler 6  . CARVEDILOL 6.25 MG PO TABS Oral Take 6.25 mg by mouth 2 (two) times daily with a meal.      . CETIRIZINE HCL 10 MG PO TABS Oral Take 10 mg by mouth daily.      Marland Kitchen DICLOFENAC SODIUM 1 % TD GEL Topical Apply 1 application topically 4 (four) times daily. 100 g 3  . GLIMEPIRIDE 4 MG PO TABS Oral Take 4 mg by mouth daily before breakfast.      . INSULIN ASPART 100 UNIT/ML [place] SOLN Subcutaneous Inject 30 Units into the skin 3 (three) times daily before meals.    . INSULIN GLARGINE 100 UNIT/ML [place] SOLN Subcutaneous Inject 80 Units into the skin 2 (two) times daily. 50 mL 12  . LISINOPRIL 10 MG PO TABS Oral Take 10 mg by mouth daily.      Marland Kitchen MUPIROCIN 2 % EX OINT  APPLY TO AFFECTED AREA THREE TIMES A DAY 30 g 6  .  NYSTATIN 100000 UNIT/GM EX POWD Topical Apply topically daily.      Marland Kitchen PANTOPRAZOLE SODIUM 40 MG PO TBEC Oral Take 40 mg by mouth daily.      . SYMBICORT 160-4.5 MCG/ACT IN AERO  INHALE 2 PUFFS INTO THE LUNGS 2 TIMES DAILY 1 Inhaler 99  . TRAMADOL HCL 50 MG PO TABS Oral Take 50 mg by mouth 2 (two) times daily as needed. For breakthru pain     . ZOLPIDEM TARTRATE 5 MG PO TABS Oral Take 5 mg by mouth at bedtime as needed.      Marland Kitchen ACCU-CHEK AVIVA PLUS VI STRP  USE AS DIRECTED FOUR TIMES A DAY BEFORE MEALS AND AT BEDTIME 200 each PRN    BP 139/88  Pulse 97  Temp(Src) 98.3 F (36.8 C) (Oral)  Resp 18  SpO2 92%  Physical Exam  Nursing note and vitals reviewed. Constitutional: She appears well-developed and well-nourished. No distress.       Morbidly obese  HENT:  Head: Normocephalic and atraumatic.  Right Ear: External ear normal.  Left Ear: External ear normal.  Eyes: Conjunctivae are normal. Right eye  exhibits no discharge. Left eye exhibits no discharge. No scleral icterus.  Neck: Neck supple. No tracheal deviation present.  Cardiovascular: Normal rate, regular rhythm and intact distal pulses.   Pulmonary/Chest: Effort normal and breath sounds normal. No stridor. No respiratory distress. She has no wheezes. She has no rales. She exhibits tenderness.  Abdominal: Soft. Bowel sounds are normal. She exhibits no distension. There is no tenderness. There is no rebound and no guarding.  Musculoskeletal: She exhibits edema. She exhibits no tenderness.  Neurological: She is alert. She has normal strength. No sensory deficit. Cranial nerve deficit:  no gross defecits noted. She exhibits normal muscle tone. She displays no seizure activity. Coordination normal.  Skin: Skin is warm and dry. No rash noted.  Psychiatric: She has a normal mood and affect.    ED Course  Procedures (including critical care time)  Rate: 100  Rhythm: normal sinus rhythm  QRS Axis: Right   Intervals: normal  ST/T Wave abnormalities: normal  Conduction Disutrbances:none  Narrative Interpretation:   Old EKG Reviewed: unchanged  Labs Reviewed  POCT I-STAT, CHEM 8 - Abnormal; Notable for the following:    Glucose, Bld 440 (*)    Hemoglobin 15.3 (*)    All other components within normal limits  CBC  D-DIMER, QUANTITATIVE  POCT I-STAT TROPONIN I   Dg Chest 2 View  03/17/2012  *RADIOLOGY REPORT*  Clinical Data: Chest pain for 2 weeks worsening.  Asthma.  High blood pressure.  Diabetes.  CHEST - 2 VIEW  Comparison: 05/28/2011, 07/01/2010 and 04/01/2009.  Findings: Prominence of the right hilum unchanged appearing vascular origin.  This limits detection of underlying mass or adenopathy.  Poor inspiration with central pulmonary vascular prominence without segmental infiltrate or pneumothorax.  Heart size within normal limits.  IMPRESSION: Pulmonary vascular prominence most notable centrally as detailed above.  No segmental  infiltrate.  Original Report Authenticated By: Fuller Canada, M.D.     MDM   Patient presents to the emergency room with chest pain. It is sharp and atypical for cardiac etiology. She has a negative d-dimer and has no other symptoms or risk factors for pulmonary embolism. Her pain is reproducible with tenderness along the chest wall. I suspect this may be a costochondritis.  The patient be discharged home with prescriptions for medications for pain.  She does have been have  significant hyperglycemia. Reviewing her old records it appears that she has poor glucose control. Patient has been given IV fluids and insulin. There does not appear to be evidence of diabetic ketoacidosis.    Celene Kras, MD 03/17/12 [phone]

## 2012-03-22 ENCOUNTER — Ambulatory Visit (INDEPENDENT_AMBULATORY_CARE_PROVIDER_SITE_OTHER): Payer: Medicaid Other | Admitting: Family Medicine

## 2012-03-22 ENCOUNTER — Encounter: Payer: Self-pay | Admitting: Family Medicine

## 2012-03-22 DIAGNOSIS — R079 Chest pain, unspecified: Secondary | ICD-10-CM

## 2012-03-22 MED ORDER — MELOXICAM 15 MG PO TABS: 15.0000 mg | ORAL_TABLET | Freq: Every day | ORAL | Status: DC

## 2012-03-22 MED ORDER — GLUCOSE BLOOD VI STRP: ORAL_STRIP | Status: DC

## 2012-03-22 NOTE — Patient Instructions (Signed)
Thank you for coming in today. Please take the meloxicam daily as needed for pain.  Call or go to the emergency room if you get worse, have trouble breathing, have chest pains, or palpitations.  I will refer you to the weight loss clinic.  See me in 1 month or sooner if needed.  Hand on heart, I will not yell or be mean if you have problems losing weight. But working with me will be better.

## 2012-03-23 ENCOUNTER — Encounter: Payer: Self-pay | Admitting: Family Medicine

## 2012-03-23 ENCOUNTER — Telehealth: Payer: Self-pay | Admitting: Family Medicine

## 2012-03-23 NOTE — Assessment & Plan Note (Signed)
Not sure of etiology however I think costochondritis is most likely.  PE and MI very unlikely given normal d-dimer and normal EKGs and troponins.  Additionally chest x-ray showed no acute disease.   Plan to treat pain with meloxicam and followup in one or 2 weeks. Discuss red flag signs or symptoms with patient who expresses understanding.

## 2012-03-23 NOTE — Telephone Encounter (Signed)
Patient is calling because the Rx for strips was sent but it needed to be for the meter.  [facility].  Call patient when this is corrected please.

## 2012-03-23 NOTE — Assessment & Plan Note (Signed)
Patient is extremely obese with a BMI approaching 70. I think that referral to bariatric surgery makes good sense.  Additionally we'll encourage patient to have some level of activity and eat less than 3000 calories a day. Encouraged to follow up with me as soon as she can to discuss her diet in detail.

## 2012-03-23 NOTE — Progress Notes (Signed)
[name] is a 36 y.o. female who presents to [facility] today for   1) followup chest pain. Patient was seen in clinic approximately one week ago for chest pain. She was determined to have likely costochondritis but a d-dimer was ordered as well as an EKG. D-dimer was negative however a few days later patient's chest pain worsened and she presented to the emergency room where workup was completed and a determination of costochondritis was made. She was sent home with Vicodin. This helped but the Vicodin ran out a day ago and the chest pain returned. Overall she states that the pain is improved but still present. She notes pain on the bilateral chest worsening with deep inspiration and palpation.  She denies any new difficulty breathing or significant exertional pain.  2) morbid obesity: Patient would like a referral to bariatric surgery clinic at [facility] if possible.  She is very motivated to reach a healthier weight so that she can live to see her child grow up.   PMH, SH reviewed: Significant for morbid obesity, hypertension, diabetes ROS as above otherwise neg. No, palpitations, SOB, Fever, Chills, Abd pain, N/V/D.  Medications reviewed. Current Outpatient Prescriptions  Medication Sig Dispense Refill  . albuterol (VENTOLIN HFA) 108 (90 BASE) MCG/ACT inhaler Inhale 2 puffs into the lungs every 4 (four) hours as needed for wheezing.  1 Inhaler  6  . carvedilol (COREG) 6.25 MG tablet Take 6.25 mg by mouth 2 (two) times daily with a meal.        . cetirizine (ZYRTEC) 10 MG tablet Take 10 mg by mouth daily.        . diclofenac sodium (VOLTAREN) 1 % GEL Apply 1 application topically 4 (four) times daily.  100 g  3  . glimepiride (AMARYL) 4 MG tablet Take 4 mg by mouth daily before breakfast.        . glucose blood (ACCU-CHEK AVIVA PLUS) test strip Use as instructed  200 each  PRN  . HYDROcodone-acetaminophen (NORCO) 5-325 MG per tablet Take 1-2 tablets by mouth every 6 (six) hours as needed for  pain.  16 tablet  0  . insulin aspart (NOVOLOG) 100 UNIT/ML injection Inject 30 Units into the skin 3 (three) times daily before meals.      . insulin glargine (LANTUS) 100 UNIT/ML injection Inject 80 Units into the skin 2 (two) times daily.  50 mL  12  . lisinopril (PRINIVIL,ZESTRIL) 10 MG tablet Take 10 mg by mouth daily.        . meloxicam (MOBIC) 15 MG tablet Take 1 tablet (15 mg total) by mouth daily.  14 tablet  0  . mupirocin ointment (BACTROBAN) 2 % APPLY TO AFFECTED AREA THREE TIMES A DAY  30 g  6  . nystatin (MYCOSTATIN) powder Apply topically daily.        . pantoprazole (PROTONIX) 40 MG tablet Take 40 mg by mouth daily.        . SYMBICORT 160-4.5 MCG/ACT inhaler INHALE 2 PUFFS INTO THE LUNGS 2 TIMES DAILY  1 Inhaler  99  . traMADol (ULTRAM) 50 MG tablet Take 50 mg by mouth 2 (two) times daily as needed. For breakthru pain       . zolpidem (AMBIEN) 5 MG tablet Take 5 mg by mouth at bedtime as needed.          Exam:  BP 145/79  Pulse 96  Ht 5\' 2"  (1.575 m)  Wt 382 lb (173.274 kg)  BMI  69.87 kg/m2  SpO2 93% Gen: Well NAD, morbidly obese HEENT: EOMI,  MMM Lungs: CTABL Nl WOB Heart: RRR no MRG Abd: NABS, NT, ND, significant pannus Exts: Non edematous BL  LE, warm and well perfused.   Studies reviewed below including EKGs not listed  Lab 03/17/12 0142  NA 137  K 4.4  CL 99  CO2 --  BUN 10  CREATININE 0.60  LABGLOM --  GLUCOSE 440*  CALCIUM --   Lab Results  Component Value Date   DDIMER 0.40 03/17/2012                           Dg Chest 2 View  03/17/2012  *RADIOLOGY REPORT*  Clinical Data: Chest pain for 2 weeks worsening.  Asthma.  High blood pressure.  Diabetes.  CHEST - 2 VIEW  Comparison: 05/28/2011, 07/01/2010 and 04/01/2009.  Findings: Prominence of the right hilum unchanged appearing vascular origin.  This limits detection of underlying mass or adenopathy.  Poor inspiration with central pulmonary vascular prominence without segmental infiltrate or  pneumothorax.  Heart size within normal limits.  IMPRESSION: Pulmonary vascular prominence most notable centrally as detailed above.  No segmental infiltrate.  Original Report Authenticated By: Fuller Canada, M.D.

## 2012-03-24 NOTE — Telephone Encounter (Signed)
Called in the correct strips.

## 2012-03-29 ENCOUNTER — Telehealth: Payer: Self-pay | Admitting: Family Medicine

## 2012-03-29 NOTE — Telephone Encounter (Signed)
Pt called emergency line tonight- states that she is having blood in stool with bowel movement.  Has a history of anal fissure, but this seems like more blood than she has had in the past with this.  No dizziness. No syncope.  Pt instructed to go to the ER if bleeding is constant or if any dizziness.  The bleeding has now stopped.  If bleeding remains controlled pt can wait until the AM and can call at 8:30 in the morning to get a work in appointment for [place] to evaluate in the family practice office.  Pt states understanding.

## 2012-03-31 ENCOUNTER — Other Ambulatory Visit: Payer: Self-pay | Admitting: Family Medicine

## 2012-03-31 ENCOUNTER — Telehealth: Payer: Self-pay | Admitting: *Deleted

## 2012-03-31 MED ORDER — ZOLPIDEM TARTRATE 5 MG PO TABS: 5.0000 mg | ORAL_TABLET | Freq: Every evening | ORAL | Status: DC | PRN

## 2012-03-31 NOTE — Telephone Encounter (Signed)
PA required for  generic Ambien. Form placed in MD box.

## 2012-04-03 NOTE — Telephone Encounter (Signed)
Completed and faxed.

## 2012-04-10 ENCOUNTER — Encounter: Payer: Self-pay | Admitting: Family Medicine

## 2012-04-10 ENCOUNTER — Encounter (HOSPITAL_COMMUNITY): Payer: Self-pay | Admitting: Emergency Medicine

## 2012-04-10 ENCOUNTER — Emergency Department (HOSPITAL_COMMUNITY)
Admission: EM | Admit: 2012-04-10 | Discharge: 2012-04-10 | Disposition: A | Discharge status: Home / Self Care | Payer: Medicaid Other | Attending: Emergency Medicine | Admitting: Emergency Medicine

## 2012-04-10 ENCOUNTER — Telehealth: Payer: Self-pay | Admitting: Family Medicine

## 2012-04-10 ENCOUNTER — Ambulatory Visit (INDEPENDENT_AMBULATORY_CARE_PROVIDER_SITE_OTHER): Payer: Medicaid Other | Admitting: Family Medicine

## 2012-04-10 VITALS — BP 120/87 | HR 89 | Ht 62.0 in | Wt 384.0 lb

## 2012-04-10 DIAGNOSIS — I10 Essential (primary) hypertension: Secondary | ICD-10-CM | POA: Insufficient documentation

## 2012-04-10 DIAGNOSIS — E119 Type 2 diabetes mellitus without complications: Secondary | ICD-10-CM | POA: Insufficient documentation

## 2012-04-10 DIAGNOSIS — Z87891 Personal history of nicotine dependence: Secondary | ICD-10-CM | POA: Insufficient documentation

## 2012-04-10 DIAGNOSIS — M25569 Pain in unspecified knee: Secondary | ICD-10-CM

## 2012-04-10 DIAGNOSIS — J45909 Unspecified asthma, uncomplicated: Secondary | ICD-10-CM | POA: Insufficient documentation

## 2012-04-10 DIAGNOSIS — M79609 Pain in unspecified limb: Secondary | ICD-10-CM | POA: Insufficient documentation

## 2012-04-10 DIAGNOSIS — R252 Cramp and spasm: Secondary | ICD-10-CM

## 2012-04-10 DIAGNOSIS — R739 Hyperglycemia, unspecified: Secondary | ICD-10-CM

## 2012-04-10 DIAGNOSIS — K219 Gastro-esophageal reflux disease without esophagitis: Secondary | ICD-10-CM | POA: Insufficient documentation

## 2012-04-10 DIAGNOSIS — M25561 Pain in right knee: Secondary | ICD-10-CM

## 2012-04-10 DIAGNOSIS — Z794 Long term (current) use of insulin: Secondary | ICD-10-CM | POA: Insufficient documentation

## 2012-04-10 LAB — POCT I-STAT, CHEM 8
Creatinine, Ser: 0.5 mg/dL (ref 0.50–1.10)
Hemoglobin: 15 g/dL (ref 12.0–15.0)
Sodium: 138 mEq/L (ref 135–145)
TCO2: 29 mmol/L (ref 0–100)

## 2012-04-10 MED ORDER — CYCLOBENZAPRINE HCL 10 MG PO TABS: 10.0000 mg | ORAL_TABLET | Freq: Three times a day (TID) | ORAL | Status: DC | PRN

## 2012-04-10 MED ORDER — HYDROCODONE-ACETAMINOPHEN 5-500 MG PO TABS: 1.0000 | ORAL_TABLET | Freq: Three times a day (TID) | ORAL | Status: DC | PRN

## 2012-04-10 MED ORDER — HYDROCODONE-ACETAMINOPHEN 5-325 MG PO TABS: 1.0000 | ORAL_TABLET | ORAL | Status: DC | PRN

## 2012-04-10 NOTE — Telephone Encounter (Signed)
Left message for [name] at [facility] bariatric surgery clinic to return call to check on status of referral, was not referred to GI.Nayib Remer, Rodena Medin

## 2012-04-10 NOTE — Assessment & Plan Note (Signed)
Unsure of etiology.  Electrolytes are within normal range.  Patient does not appear to have significant plantar fasciitis.   Plan to treat with muscle relaxer and followup in 2 weeks of worsening.  Patient expresses understanding.

## 2012-04-10 NOTE — Progress Notes (Signed)
[name] is a 36 y.o. female who presents to [facility] today for   1) bilateral foot cramping: Patient has had bilateral foot cramping for approximately one week. It worsened last night and she presented to the emergency room. In the emergency room her basic metabolic panel was found to be normal and she was discharged with hydrocodone. She denies any pain at rest in her feet numbness or tingling. She doesn't have any other cramping in her body.  2) bilateral knee pain left worse than right: Patient has had significant knee pain for approximately 3 weeks now. Again it worsened yesterday and she presented to the emergency room. She has had a history of knee injections in the right which have helped some. She is interested in any injection today. She denies any recent falls however does note a fall about a month ago where she landed on her knee cap. She notes the pain is diffuse and inside of her knee.  No fevers chills worsening trouble breathing, numbness or tingling. No chest pain or palpitations   PMH: Reviewed significant for extreme obesity with a BMI protein 70 and sequelae of that such as hypertension and diabetes History  Substance Use Topics  . Smoking status: Former Smoker    Quit date: 12/07/2003  . Smokeless tobacco: Never Used  . Alcohol Use: No   ROS as above  Medications reviewed. Current Outpatient Prescriptions  Medication Sig Dispense Refill  . albuterol (VENTOLIN HFA) 108 (90 BASE) MCG/ACT inhaler Inhale 2 puffs into the lungs every 4 (four) hours as needed for wheezing.  1 Inhaler  6  . carvedilol (COREG) 6.25 MG tablet Take 6.25 mg by mouth 2 (two) times daily with a meal.        . cetirizine (ZYRTEC) 10 MG tablet Take 10 mg by mouth daily.        . cyclobenzaprine (FLEXERIL) 10 MG tablet Take 1 tablet (10 mg total) by mouth 3 (three) times daily as needed for muscle spasms.  30 tablet  0  . glimepiride (AMARYL) 4 MG tablet Take 4 mg by mouth daily before breakfast.         . glucose blood (ACCU-CHEK AVIVA PLUS) test strip Use as instructed  200 each  PRN  . insulin aspart (NOVOLOG) 100 UNIT/ML injection Inject 30 Units into the skin 3 (three) times daily before meals.      . insulin glargine (LANTUS) 100 UNIT/ML injection Inject 80 Units into the skin 2 (two) times daily.  50 mL  12  . lisinopril (PRINIVIL,ZESTRIL) 10 MG tablet Take 10 mg by mouth daily.        . meloxicam (MOBIC) 15 MG tablet Take 1 tablet (15 mg total) by mouth daily.  14 tablet  0  . mupirocin ointment (BACTROBAN) 2 % APPLY TO AFFECTED AREA THREE TIMES A DAY  30 g  6  . nystatin (MYCOSTATIN) powder Apply topically daily.        . pantoprazole (PROTONIX) 40 MG tablet Take 40 mg by mouth daily.        . SYMBICORT 160-4.5 MCG/ACT inhaler INHALE 2 PUFFS INTO THE LUNGS 2 TIMES DAILY  1 Inhaler  99  . traMADol (ULTRAM) 50 MG tablet Take 50 mg by mouth 2 (two) times daily as needed. For breakthru pain       . zolpidem (AMBIEN) 5 MG tablet Take 1 tablet (5 mg total) by mouth at bedtime as needed.  90 tablet  1  .  HYDROcodone-acetaminophen (VICODIN) 5-500 MG per tablet Take 1 tablet by mouth every 8 (eight) hours as needed for pain.  45 tablet  0    Exam:  BP 120/87  Pulse 89  Ht 5\' 2"  (1.575 m)  Wt 384 lb (174.181 kg)  BMI 70.23 kg/m2 Gen: Well NAD, extreme morbid obesity HEENT: EOMI,  MMM Lungs: CTABL Nl WOB Heart: RRR no MRG, distant Exts: Non edematous BL  LE, warm and well perfused.  feet: Normal appearing bilaterally.  Nontender over the heel. Normal pulses. Left knee: Obese difficult to ascertain landmarks.  Dilated vein over the medial peripatellar space. Nontender over palpable joint line. Normal motion.  Laboratory findings reviewed Results for orders placed during the hospital encounter of 04/10/12 (from the past 72 hour(s))  POCT I-STAT, CHEM 8     Status: Abnormal   Collection Time   04/10/12  8:37 AM      Component Value Range Comment   Sodium 138  135 - 145 (mEq/L)     Potassium 3.8  3.5 - 5.1 (mEq/L)    Chloride 98  96 - 112 (mEq/L)    BUN 6  6 - 23 (mg/dL)    Creatinine, Ser 2.13  0.50 - 1.10 (mg/dL)    Glucose, Bld 086 (*) 70 - 99 (mg/dL)    Calcium, Ion 5.78 (*) 1.12 - 1.32 (mmol/L)    TCO2 29  0 - 100 (mmol/L)    Hemoglobin 15.0  12.0 - 15.0 (g/dL)    HCT 46.9  62.9 - 52.8 (%)      procedure note: Knee injection  Consent obtained and timeout performed . Landmarks palpated.  Area cleaned with Betadine and alcohol.  25-gauge 1.5 inch needle was used to inject into the lateral peripatellar space.  Small amount of fluid was injected resistance met.  It was then advanced a bit further and fluid went in easily.  40 mg of Kenalog and 4 milliliters of 0.25% Marcaine were injected.  It was drained and small amount of bleeding stopped easily with 2 x 2 gauze for 15 seconds. Betadine cleaned off with alcohol swab and a Band-Aid was applied.  Patient tolerated procedure

## 2012-04-10 NOTE — Telephone Encounter (Signed)
Called patient, says she is supposed to be referred to GI for rectal bleed, I do not see any referral to GI?? Also do not see anything in the notes about rectal bleed. Will forward to PCP.Wilborn Membreno, Rodena Medin

## 2012-04-10 NOTE — Assessment & Plan Note (Signed)
Almost certainly secondary to her extreme obesity. Knee injection was performed today. However the patient is so obese landmarks are difficult to ascertain.  I think it is likely that it reached the intra-articular space however I may have injected into the Hoffa's fat pad instead.   If she does not get relief I recommend follow up with sports medicine for a guided knee injection under ultrasound.  Additionally will help to treat pain with hydrocodone Precautions reviewed. Patient expresses understanding follow up with me in 2 weeks

## 2012-04-10 NOTE — Telephone Encounter (Signed)
Patient was in this morning but forgot to ask what the status of the referral for Gastroenterologist.

## 2012-04-10 NOTE — Discharge Instructions (Signed)
Please call your doctor to discuss your blood sugar control. Speak with your doctor as needed if knee pain and foot cramps continue.    Arthralgia Your caregiver has diagnosed you as suffering from an arthralgia. Arthralgia means there is pain in a joint. This can come from many reasons including:  Bruising the joint which causes soreness (inflammation) in the joint.   Wear and tear on the joints which occur as we grow older (osteoarthritis).   Overusing the joint.   Various forms of arthritis.   Infections of the joint.  Regardless of the cause of pain in your joint, most of these different pains respond to anti-inflammatory drugs and rest. The exception to this is when a joint is infected, and these cases are treated with antibiotics, if it is a bacterial infection. HOME CARE INSTRUCTIONS   Rest the injured area for as long as directed by your caregiver. Then slowly start using the joint as directed by your caregiver and as the pain allows. Crutches as directed may be useful if the ankles, knees or hips are involved. If the knee was splinted or casted, continue use and care as directed. If an stretchy or elastic wrapping bandage has been applied today, it should be removed and re-applied every 3 to 4 hours. It should not be applied tightly, but firmly enough to keep swelling down. Watch toes and feet for swelling, bluish discoloration, coldness, numbness or excessive pain. If any of these problems (symptoms) occur, remove the ace bandage and re-apply more loosely. If these symptoms persist, contact your caregiver or return to this location.   For the first 24 hours, keep the injured extremity elevated on pillows while lying down.   Apply ice for 15 to 20 minutes to the sore joint every couple hours while awake for the first half day. Then 3 to 4 times per day for the first 48 hours. Put the ice in a plastic bag and place a towel between the bag of ice and your skin.   Wear any splinting,  casting, elastic bandage applications, or slings as instructed.   Only take over-the-counter or prescription medicines for pain, discomfort, or fever as directed by your caregiver. Do not use aspirin immediately after the injury unless instructed by your physician. Aspirin can cause increased bleeding and bruising of the tissues.   If you were given crutches, continue to use them as instructed and do not resume weight bearing on the sore joint until instructed.  Persistent pain and inability to use the sore joint as directed for more than 2 to 3 days are warning signs indicating that you should see a caregiver for a follow-up visit as soon as possible. Initially, a hairline fracture (break in bone) may not be evident on X-rays. Persistent pain and swelling indicate that further evaluation, non-weight bearing or use of the joint (use of crutches or slings as instructed), or further X-rays are indicated. X-rays may sometimes not show a small fracture until a week or 10 days later. Make a follow-up appointment with your own caregiver or one to whom we have referred you. A radiologist (specialist in reading X-rays) may read your X-rays. Make sure you know how you are to obtain your X-ray results. Do not assume everything is normal if you do not hear from [place]. SEEK MEDICAL CARE IF: Bruising, swelling, or pain increases. SEEK IMMEDIATE MEDICAL CARE IF:   Your fingers or toes are numb or blue.   The pain is not responding to  medications and continues to stay the same or get worse.   The pain in your joint becomes severe.   You develop a fever over 102 F (38.9 C).   It becomes impossible to move or use the joint.  MAKE SURE YOU:   Understand these instructions.   Will watch your condition.   Will get help right away if you are not doing well or get worse.  Document Released: 11/22/2005 Document Revised: 11/11/2011 Document Reviewed: 07/10/2008 [facility] Patient Information 2012 [place],  [place].       Diabetes, Frequently Asked Questions WHAT IS DIABETES? Most of the food we eat is turned into glucose (sugar). Our bodies use it for energy. The pancreas makes a hormone called insulin. It helps glucose get into the cells of our bodies. When you have diabetes, your body either does not make enough insulin or cannot use its own insulin as well as it should. This causes sugars to build up in your blood. WHAT ARE THE SYMPTOMS OF DIABETES?  Frequent urination.   Excessive thirst.   Unexplained weight loss.   Extreme hunger.   Blurred vision.   Tingling or numbness in hands or feet.   Feeling very tired much of the time.   Dry, itchy skin.   Sores that are slow to heal.   Yeast infections.  WHAT ARE THE TYPES OF DIABETES? Type 1 Diabetes   About 10% of affected people have this type.   Usually occurs before the age of 36.   Usually occurs in thin to normal weight people.  Type 2 Diabetes  About 90% of affected people have this type.   Usually occurs after the age of 29.   Usually occurs in overweight people.   More likely to have:   A family history of diabetes.   A history of diabetes during pregnancy (gestational diabetes).   High blood pressure.   High cholesterol and triglycerides.  Gestational Diabetes  Occurs in about 4% of pregnancies.   Usually goes away after the baby is born.   More likely to occur in women with:   Family history of diabetes.   Previous gestational diabetes.   Obese.   Over 43 years old.  WHAT IS PRE-DIABETES? Pre-diabetes means your blood glucose is higher than normal, but lower than the diabetes range. It also means you are at risk of getting type 2 diabetes and heart disease. If you are told you have pre-diabetes, have your blood glucose checked again in 1 to 2 years. WHAT IS THE TREATMENT FOR DIABETES? Treatment is aimed at keeping blood glucose near normal levels at all times. Learning how to manage this  yourself is important in treating diabetes. Depending on the type of diabetes you have, your treatment will include one or more of the following:  Monitoring your blood glucose.   Meal planning.   Exercise.   Oral medicine (pills) or insulin.  CAN DIABETES BE PREVENTED? With type 1 diabetes, prevention is more difficult, because the triggers that cause it are not yet known. With type 2 diabetes, prevention is more likely, with lifestyle changes:  Maintain a healthy weight.   Eat healthy.   Exercise.  IS THERE A CURE FOR DIABETES? No, there is no cure for diabetes. There is a lot of research going on that is looking for a cure, and progress is being made. Diabetes can be treated and controlled. People with diabetes can manage their diabetes and lead normal, active lives. SHOULD I BE  TESTED FOR DIABETES? If you are at least 34 years old, you should be tested for diabetes. You should be tested again every 3 years. If you are 45 or older and overweight, you may want to get tested more often. If you are younger than 45, overweight, and have one or more of the following risk factors, you should be tested:  Family history of diabetes.   Inactive lifestyle.   High blood pressure.  WHAT ARE SOME OTHER SOURCES FOR INFORMATION ON DIABETES? The following organizations may help in your search for more information on diabetes: National Diabetes Education Program (NDEP) Internet: [employer] American Diabetes Association Internet: http://www.diabetes.org  Juvenile Diabetes Foundation International Internet: [employer] Document Released: 11/25/2003 Document Revised: 11/11/2011 Document Reviewed: 09/19/2009 [facility] Patient Information 2012 [place], [place].

## 2012-04-10 NOTE — ED Notes (Signed)
Pt complains of pain in feet for several days and legs yesterday,  Pulses palpable and dopplared strong bilateraly

## 2012-04-10 NOTE — ED Provider Notes (Signed)
History     CSN: [id number]  Arrival date & time 04/10/12  0801   First MD Initiated Contact with Patient 04/10/12 [phone]      Chief Complaint  Patient presents with  . Leg Pain    (Consider location/radiation/quality/duration/timing/severity/associated sxs/prior treatment) The history is provided by the patient.  36 y/o with PMH DM, morbid obesity, HTN, presents to ED with c/c of 1 day of bilateral knee pain when she attempts to fully flex or fully extend the joints. Pain mod-severe, intermittent, non-radiating. There is assoc stiffness and worse pain in the morning. No numbness or weakness distal to the pain. Has tried mobic and ultram without relief. Has been able to ambulate. NKI. Pt also reports bilateral foot cramping in the same timeframe. Occurs intermittently, lasts for seconds-minutes each time. Occurs both at rest and with activity. No known aggravating or alleviating factors. No prior tx.  Past Medical History  Diagnosis Date  . Morbid obesity   . Extreme obesity with alveolar hypoventilation   . Morbid obesity   . Alveolar hypoventilation   . Diabetes mellitus   . Asthma   . GERD (gastroesophageal reflux disease)   . Rectal fissure   . Hypertension   . Costochondritis   . Cellulitis 08/2010-08/2011  . Depression   . Bipolar 2 disorder     Past Surgical History  Procedure Date  . Carpal tunnel release   . Bil knee surgery     No family history on file.  History  Substance Use Topics  . Smoking status: Former Smoker    Quit date: 12/07/2003  . Smokeless tobacco: Never Used  . Alcohol Use: No     Review of Systems  Constitutional: Negative for fever and chills.  Musculoskeletal:       See HPI  Skin: Negative for color change and wound.  Neurological: Negative for weakness and numbness.    Allergies  Nubain  Home Medications   Current Outpatient Rx  Name Route Sig Dispense Refill  . ALBUTEROL SULFATE HFA 108 (90 BASE) MCG/ACT IN AERS Inhalation  Inhale 2 puffs into the lungs every 4 (four) hours as needed for wheezing. 1 Inhaler 6  . CARVEDILOL 6.25 MG PO TABS Oral Take 6.25 mg by mouth 2 (two) times daily with a meal.      . CETIRIZINE HCL 10 MG PO TABS Oral Take 10 mg by mouth daily.      Marland Kitchen DICLOFENAC SODIUM 1 % TD GEL Topical Apply 1 application topically 4 (four) times daily as needed. For pain    . GLIMEPIRIDE 4 MG PO TABS Oral Take 4 mg by mouth daily before breakfast.      . GLUCOSE BLOOD VI STRP  Use as instructed 200 each PRN  . INSULIN ASPART 100 UNIT/ML [place] SOLN Subcutaneous Inject 30 Units into the skin 3 (three) times daily before meals.    . INSULIN GLARGINE 100 UNIT/ML [place] SOLN Subcutaneous Inject 80 Units into the skin 2 (two) times daily. 50 mL 12  . LISINOPRIL 10 MG PO TABS Oral Take 10 mg by mouth daily.      . MELOXICAM 15 MG PO TABS Oral Take 1 tablet (15 mg total) by mouth daily. 14 tablet 0  . MUPIROCIN 2 % EX OINT  APPLY TO AFFECTED AREA THREE TIMES A DAY 30 g 6  . NYSTATIN 100000 UNIT/GM EX POWD Topical Apply topically daily.      Marland Kitchen PANTOPRAZOLE SODIUM 40 MG PO TBEC Oral Take  40 mg by mouth daily.      . SYMBICORT 160-4.5 MCG/ACT IN AERO  INHALE 2 PUFFS INTO THE LUNGS 2 TIMES DAILY 1 Inhaler 99  . TRAMADOL HCL 50 MG PO TABS Oral Take 50 mg by mouth 2 (two) times daily as needed. For breakthru pain     . ZOLPIDEM TARTRATE 5 MG PO TABS Oral Take 1 tablet (5 mg total) by mouth at bedtime as needed. 90 tablet 1    BP 141/94  Pulse 84  Temp(Src) 97.8 F (36.6 C) (Oral)  Resp 20  SpO2 94%  Physical Exam  Nursing note and vitals reviewed. Constitutional: She appears well-developed. No distress.       Morbidly obese  HENT:  Head: Normocephalic and atraumatic.  Neck: Neck supple.  Cardiovascular: Normal rate and regular rhythm.   Pulses:      Radial pulses are 2+ on the right side, and 2+ on the left side.       Dorsalis pedis pulses are 2+ on the right side, and 2+ on the left side.  Pulmonary/Chest: Effort  normal. No respiratory distress.  Musculoskeletal:       Bilateral 1+ pretibial edema. Bilateral knees with slight limitation in full flexion, extension. 5/5 strength bilaterally with flex/ext. Body habitus limits examination. No appreciable laxity or tenderness on palpation around the knee joint bilaterally.  Bilateral feet non-tender. Sig decreased plantar flexion/extension of all toes, chronic and unchanged per pt. Brisk capillary refill to toes. Antalgic gait.  Neurological: She is alert.       Sensation intact to light touch and symmetric in BLE distally.  Skin: Skin is warm and dry. No rash noted.    ED Course  Procedures (including critical care time)  Labs Reviewed  POCT I-STAT, CHEM 8 - Abnormal; Notable for the following:    Glucose, Bld 352 (*)    Calcium, Ion 1.10 (*)    All other components within normal limits   No results found.   Dx 1: Knee pain Dx 2: Foot cramps Dx 3: Hyperglycemia   MDM  Chem 8 without potassium abnormality or sig Ca drop to explain foot cramps.  Foot exam baseline for pt and unremarkable. Given sx both day and night as well as at rest and with activity, doubt claudication.  Pt ambulatory without injury to specific tenderness on exam of knees- no imaging indicated. Prior imaging of 1 knee 2 years ago reviewed and showed mild tricompartmental changes. Suspect pain is secondary to OA, exacerbated by extreme weight. Discussed this and other possible causes of pain with patient.    Sig hyperglycemia on i-stat. Pt with hx poorly controlled DM, and this value appear consistent with prior measurements. No electrolyte disturbance on i-stat to suggest acidosis, no n/v or abd pain. She is instructed to follow-up closely with PCP.  Discussed outpt follow-up for continued symptoms. She voices understanding. Will give rx for pain medication in the interim.        Shaaron Adler, PA-C 04/10/12 0912  Shaaron Adler, PA-C 04/10/12  0913  Shaaron Adler, PA-C 04/10/12 [phone]

## 2012-04-10 NOTE — Patient Instructions (Signed)
Thank you for coming in today. Please take 1-2 hydrocodone as needed for pain.  Use the flexeril for foot cramps.  Let me know if that knee gets red and swollen.  If you do not get any improvement make an apointment for sports medicine. [phone].  Call or go to the emergency room if you get worse, have trouble breathing, have chest pains, or palpitations.   See me in 2 weeks or so.

## 2012-04-10 NOTE — Assessment & Plan Note (Signed)
>>  ASSESSMENT AND PLAN FOR CHRONIC PAIN OF BOTH KNEES WRITTEN ON 04/10/2012 10:56 AM BY COREY, EVAN S, MD  Almost certainly secondary to her extreme obesity. Knee injection was performed today. However the patient is so obese landmarks are difficult to ascertain.  I think it is likely that it reached the intra-articular space however I may have injected into the Hoffa's fat pad instead.   If she does not get relief I recommend follow up with sports medicine for a guided knee injection under ultrasound.  Additionally will help to treat pain with hydrocodone Precautions reviewed. Patient expresses understanding follow up with me in 2 weeks

## 2012-04-10 NOTE — ED Provider Notes (Signed)
Medical screening examination/treatment/procedure(s) were performed by non-physician practitioner and as supervising physician I was immediately available for consultation/collaboration.   Jailyne Chieffo, MD 04/10/12 1618 

## 2012-04-10 NOTE — ED Notes (Signed)
Leg pain that started yesterday spasms in her feet

## 2012-04-12 NOTE — Telephone Encounter (Signed)
Patient has already been referred and evaluated by [facility].

## 2012-04-19 ENCOUNTER — Ambulatory Visit (INDEPENDENT_AMBULATORY_CARE_PROVIDER_SITE_OTHER): Payer: Medicaid Other | Admitting: Family Medicine

## 2012-04-19 ENCOUNTER — Encounter: Payer: Self-pay | Admitting: Family Medicine

## 2012-04-19 VITALS — BP 132/85 | HR 107 | Temp 98.1°F | Ht 62.0 in | Wt 378.6 lb

## 2012-04-19 DIAGNOSIS — IMO0002 Reserved for concepts with insufficient information to code with codable children: Secondary | ICD-10-CM

## 2012-04-19 DIAGNOSIS — M25561 Pain in right knee: Secondary | ICD-10-CM

## 2012-04-19 DIAGNOSIS — IMO0001 Reserved for inherently not codable concepts without codable children: Secondary | ICD-10-CM

## 2012-04-19 DIAGNOSIS — M25569 Pain in unspecified knee: Secondary | ICD-10-CM

## 2012-04-19 DIAGNOSIS — M25562 Pain in left knee: Secondary | ICD-10-CM

## 2012-04-19 DIAGNOSIS — K602 Anal fissure, unspecified: Secondary | ICD-10-CM

## 2012-04-19 DIAGNOSIS — E1165 Type 2 diabetes mellitus with hyperglycemia: Secondary | ICD-10-CM

## 2012-04-19 MED ORDER — INSULIN ASPART 100 UNIT/ML ~~LOC~~ SOLN: 35.0000 [IU] | Freq: Three times a day (TID) | SUBCUTANEOUS | Status: DC

## 2012-04-19 MED ORDER — HYDROCODONE-ACETAMINOPHEN 5-500 MG PO TABS: 1.0000 | ORAL_TABLET | Freq: Three times a day (TID) | ORAL | Status: AC | PRN

## 2012-04-19 MED ORDER — INSULIN GLARGINE 100 UNIT/ML ~~LOC~~ SOLN: 85.0000 [IU] | Freq: Two times a day (BID) | SUBCUTANEOUS | Status: DC

## 2012-04-19 NOTE — Patient Instructions (Signed)
Thank you for coming in today. 1) Diabetes: Please increase the novolog to 35 units with meals Please increase the lantus to 85 units twice a day.  Come back in 1 month.  2) Knee: Please call [phone] to schedule an appointment with sports medicine.  3) Your goal is to lose weight by walking 10 min twice a day every day.

## 2012-04-19 NOTE — Assessment & Plan Note (Signed)
I am happy that [name] has lost weight.  Plan to continue improving her diet and additionally trying to exercise a little bit every day.  Plan 20 minutes of exercise every day

## 2012-04-19 NOTE — Progress Notes (Signed)
[name] is a 36 y.o. female who presents to [facility] today for   1) followup left knee pain: Patient continues to experience bilateral knee pain. She recently had an unguided interarticular injection of steroids into her left knee. This did not provide any kind of lasting pain relief.  She is agreeable to referral to primary care sports medicine for further evaluation.  No fevers or chills.   2) diabetes: Patient currently takes Lantus 80 units twice a day and NovoLog 30 units 3 times a day. She has no blood sugars less than 300 recently. She notes polyuria but denies any symptomatic hypoglycemia.  She is taking insulin as directed.  No nausea vomiting or diarrhea.  3) obesity: Patient has lost a little bit of weight by trying to eat less.  She feels proud about herself.  She is willing to walk 10 minutes twice a day every.   PMH: Reviewed significant for morbid obesity diabetes and hypertension History  Substance Use Topics  . Smoking status: Former Smoker    Quit date: 12/07/2003  . Smokeless tobacco: Never Used  . Alcohol Use: No   ROS as above  Medications reviewed. Current Outpatient Prescriptions  Medication Sig Dispense Refill  . albuterol (VENTOLIN HFA) 108 (90 BASE) MCG/ACT inhaler Inhale 2 puffs into the lungs every 4 (four) hours as needed for wheezing.  1 Inhaler  6  . carvedilol (COREG) 6.25 MG tablet Take 6.25 mg by mouth 2 (two) times daily with a meal.        . cetirizine (ZYRTEC) 10 MG tablet Take 10 mg by mouth daily.        . cyclobenzaprine (FLEXERIL) 10 MG tablet Take 1 tablet (10 mg total) by mouth 3 (three) times daily as needed for muscle spasms.  30 tablet  0  . glimepiride (AMARYL) 4 MG tablet Take 4 mg by mouth daily before breakfast.        . glucose blood (ACCU-CHEK AVIVA PLUS) test strip Use as instructed  200 each  PRN  . HYDROcodone-acetaminophen (VICODIN) 5-500 MG per tablet Take 1 tablet by mouth every 8 (eight) hours as needed for pain.  45 tablet  0  .  insulin aspart (NOVOLOG) 100 UNIT/ML injection Inject 35 Units into the skin 3 (three) times daily before meals.  12 pen  6  . insulin glargine (LANTUS) 100 UNIT/ML injection Inject 85 Units into the skin 2 (two) times daily.  20 pen  12  . lisinopril (PRINIVIL,ZESTRIL) 10 MG tablet Take 10 mg by mouth daily.        . meloxicam (MOBIC) 15 MG tablet Take 1 tablet (15 mg total) by mouth daily.  14 tablet  0  . mupirocin ointment (BACTROBAN) 2 % APPLY TO AFFECTED AREA THREE TIMES A DAY  30 g  6  . nystatin (MYCOSTATIN) powder Apply topically daily.        . pantoprazole (PROTONIX) 40 MG tablet Take 40 mg by mouth daily.        . SYMBICORT 160-4.5 MCG/ACT inhaler INHALE 2 PUFFS INTO THE LUNGS 2 TIMES DAILY  1 Inhaler  99  . traMADol (ULTRAM) 50 MG tablet Take 50 mg by mouth 2 (two) times daily as needed. For breakthru pain       . zolpidem (AMBIEN) 5 MG tablet Take 1 tablet (5 mg total) by mouth at bedtime as needed.  90 tablet  1  . DISCONTD: insulin aspart (NOVOLOG) 100 UNIT/ML injection Inject 30 Units  into the skin 3 (three) times daily before meals.      Marland Kitchen DISCONTD: insulin glargine (LANTUS) 100 UNIT/ML injection Inject 80 Units into the skin 2 (two) times daily.  50 mL  12    Exam:  BP 132/85  Pulse 107  Temp(Src) 98.1 F (36.7 C) (Oral)  Ht 5\' 2"  (1.575 m)  Wt 378 lb 9 oz (171.715 kg)  BMI 69.24 kg/m2  LMP 04/14/2012 Gen: Well NAD, morbidly obese HEENT: EOMI,  MMM Lungs: CTABL Nl WOB Heart: RRR no MRG Abd: NABS, NT, ND Exts: Non edematous BL  LE, warm and well perfused.  Knee: Nontender throughout. Normal range of motion.  Difficult exam due to body habitus.  Feet: 0/4 to monofilament bilaterally.  No ulcers noted normal nails .  No results found for this or any previous visit (from the past 72 hour(s)).

## 2012-04-19 NOTE — Assessment & Plan Note (Signed)
>>  ASSESSMENT AND PLAN FOR CHRONIC PAIN OF BOTH KNEES WRITTEN ON 04/19/2012  2:41 PM BY COREY, Michel Harrow, MD  Patient is knee pain likely secondary to osteoarthritis secondary to her weight.  She did not get benefit from unguided steroid injection. I believe that I was unable to place the medications into the knee capsule secondary to her body habitus. She will likely benefit from ultrasound guided injection.  Plan to refer to sports medicine for evaluation and potential ultrasound-guided ejection.  Be working in sports medicine in July and we'll be happy to see her there that is the most earliest available appointment

## 2012-04-19 NOTE — Assessment & Plan Note (Signed)
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   Her A1c has been greater than 14 recently.  I feel that at this point diet is not likely the answer and increasing the insulin is needed.  Plan to start titrating her insulin by 10% every visit. We'll increase Lantus to 85 units twice daily and NovoLog 35 units 3 times a day.  Followup in one month

## 2012-04-19 NOTE — Assessment & Plan Note (Signed)
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   Her A1c has been greater than 14 recently.  I feel that at this point diet is not likely the answer and increasing the insulin is needed.  Plan to start titrating her insulin by 10% every visit. We'll increase Lantus to 85 units twice daily and NovoLog 35 units 3 times a day.  Followup in one month

## 2012-04-19 NOTE — Assessment & Plan Note (Signed)
Patient is knee pain likely secondary to osteoarthritis secondary to her weight.  She did not get benefit from unguided steroid injection. I believe that I was unable to place the medications into the knee capsule secondary to her body habitus. She will likely benefit from ultrasound guided injection.  Plan to refer to sports medicine for evaluation and potential ultrasound-guided ejection.  Be working in sports medicine in July and we'll be happy to see her there that is the most earliest available appointment

## 2012-04-26 ENCOUNTER — Ambulatory Visit: Payer: Medicaid Other | Admitting: Family Medicine

## 2012-04-27 ENCOUNTER — Ambulatory Visit (INDEPENDENT_AMBULATORY_CARE_PROVIDER_SITE_OTHER): Payer: Medicaid Other | Admitting: Family Medicine

## 2012-04-27 VITALS — BP 149/99 | Ht 62.0 in | Wt 374.0 lb

## 2012-04-27 DIAGNOSIS — M25561 Pain in right knee: Secondary | ICD-10-CM

## 2012-04-27 DIAGNOSIS — M25569 Pain in unspecified knee: Secondary | ICD-10-CM

## 2012-04-27 DIAGNOSIS — M25562 Pain in left knee: Secondary | ICD-10-CM

## 2012-04-27 DIAGNOSIS — M1712 Unilateral primary osteoarthritis, left knee: Secondary | ICD-10-CM

## 2012-04-27 DIAGNOSIS — IMO0002 Reserved for concepts with insufficient information to code with codable children: Secondary | ICD-10-CM

## 2012-04-27 DIAGNOSIS — M1710 Unilateral primary osteoarthritis, unspecified knee: Secondary | ICD-10-CM

## 2012-04-27 DIAGNOSIS — M1711 Unilateral primary osteoarthritis, right knee: Secondary | ICD-10-CM

## 2012-04-27 MED ORDER — MELOXICAM 15 MG PO TABS: ORAL_TABLET | ORAL | Status: DC
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
## 2012-05-02 ENCOUNTER — Other Ambulatory Visit: Payer: Self-pay | Admitting: Family Medicine

## 2012-05-08 ENCOUNTER — Other Ambulatory Visit: Payer: Self-pay | Admitting: *Deleted

## 2012-05-08 ENCOUNTER — Telehealth: Payer: Self-pay | Admitting: Family Medicine

## 2012-05-08 MED ORDER — HYDROCODONE-ACETAMINOPHEN 5-325 MG PO TABS: ORAL_TABLET | ORAL | Status: DC

## 2012-05-08 NOTE — Telephone Encounter (Signed)
Amy, RN at [facility] will address phone note with Dr. Ashley Barron and call patient back.  Patient states she will go get bilateral knee xrays today when her husband gets home from work.

## 2012-05-08 NOTE — Progress Notes (Signed)
Pt called tearful about knee pain.  States it is severe.  Tramadol and meloxicam not helpful at all.  Rx for norco sent to [facility] on [address] for pt per Dr. Ashley Bickley.  Advised her this is a one time prescription for the acute flare up of pain that she is having.

## 2012-05-08 NOTE — Telephone Encounter (Signed)
Called pt. She never had her x-rays done. See previous note from [facility]. Pt said, that she was not aware of getting x-rays. Request pain medications, because she is in a lot of pain. I told the pt, that I will send the message to Dr.Corey and also to [facility]. She will get her x-rays done today. Marland KitchenLorenda Hatchet, Renato Battles

## 2012-05-08 NOTE — Telephone Encounter (Signed)
Pt was seen at sports med office last week.  Need something else for pain.  Was given mobic for the inflammation, but it doesn't work.  If patient need to come in please inform.

## 2012-05-09 ENCOUNTER — Ambulatory Visit
Admission: RE | Admit: 2012-05-09 | Discharge: 2012-05-09 | Disposition: A | Discharge status: Home / Self Care | Payer: Medicaid Other | Source: Ambulatory Visit | Attending: Family Medicine | Admitting: Family Medicine

## 2012-05-09 NOTE — Telephone Encounter (Signed)
I will continue to follow.

## 2012-05-22 ENCOUNTER — Ambulatory Visit (INDEPENDENT_AMBULATORY_CARE_PROVIDER_SITE_OTHER): Payer: Medicaid Other | Admitting: Family Medicine

## 2012-05-22 ENCOUNTER — Encounter: Payer: Self-pay | Admitting: Family Medicine

## 2012-05-22 VITALS — BP 132/89 | HR 102 | Ht 62.0 in | Wt 372.0 lb

## 2012-05-22 DIAGNOSIS — M25569 Pain in unspecified knee: Secondary | ICD-10-CM

## 2012-05-22 DIAGNOSIS — M25562 Pain in left knee: Secondary | ICD-10-CM

## 2012-05-22 DIAGNOSIS — IMO0001 Reserved for inherently not codable concepts without codable children: Secondary | ICD-10-CM

## 2012-05-22 DIAGNOSIS — E1165 Type 2 diabetes mellitus with hyperglycemia: Secondary | ICD-10-CM

## 2012-05-22 DIAGNOSIS — R252 Cramp and spasm: Secondary | ICD-10-CM

## 2012-05-22 DIAGNOSIS — IMO0002 Reserved for concepts with insufficient information to code with codable children: Secondary | ICD-10-CM

## 2012-05-22 DIAGNOSIS — M25561 Pain in right knee: Secondary | ICD-10-CM

## 2012-05-22 MED ORDER — CYCLOBENZAPRINE HCL 10 MG PO TABS: 10.0000 mg | ORAL_TABLET | Freq: Three times a day (TID) | ORAL | Status: DC | PRN

## 2012-05-22 MED ORDER — INSULIN GLARGINE 100 UNIT/ML ~~LOC~~ SOLN: 95.0000 [IU] | Freq: Two times a day (BID) | SUBCUTANEOUS | Status: DC

## 2012-05-22 MED ORDER — HYDROCODONE-ACETAMINOPHEN 10-325 MG PO TABS: 1.0000 | ORAL_TABLET | Freq: Three times a day (TID) | ORAL | Status: DC | PRN

## 2012-05-22 MED ORDER — INSULIN ASPART 100 UNIT/ML ~~LOC~~ SOLN: 45.0000 [IU] | Freq: Three times a day (TID) | SUBCUTANEOUS | Status: DC

## 2012-05-22 NOTE — Assessment & Plan Note (Signed)
Significant pain. Pain improved with hydrocodone therapy. Plan to continue hydrocodone therapy total 10 mg daily.  The goal is to allow more exercise and continued weight loss. Followup in one month

## 2012-05-22 NOTE — Assessment & Plan Note (Signed)
>>  ASSESSMENT AND PLAN FOR CHRONIC PAIN OF BOTH KNEES WRITTEN ON 05/22/2012  3:02 PM BY COREY, Michel Harrow, MD  Significant pain. Pain improved with hydrocodone therapy. Plan to continue hydrocodone therapy total 10 mg daily.  The goal is to allow more exercise and continued weight loss. Followup in one month

## 2012-05-22 NOTE — Patient Instructions (Addendum)
Thank you for coming in today. 1) Diabetes: Take 95 units of lantus twice a day. Take 45 units of novolog three times a day.  Check your sugar and let me know if you get low.  2) Knee pain: Giving Vicodin 10 for pain daily as needed. See Dr. Ashley Jasper.  Your pain medicine is to allow you to walk more.   Come back in 1 month.

## 2012-05-22 NOTE — Progress Notes (Signed)
[name] is a 36 y.o. female who presents to [facility] today for   1) bilateral knee pain:  Patient is significant bilateral knee pain that is currently being managed by myself and with sports medicine.  X-rays were unremarkable and patient did receive significant help from hydrocodone pills.  Because she was on hydrocodone and have less pain she has been able to walk more.  She notes a 12 pound weight loss in the last month. No new locking catching or giving way.  2) diabetes: Currently taking Lantus 85 units twice daily and NovoLog 35 units 3 times a day.  Notes that blood sugars always above 200 usually near 300.  Denies any hypoglycemia.   3) obesity: Increased walking resulted in a 12 pound weight loss over the last month  PMH: Reviewed significant for morbid obesity History  Substance Use Topics  . Smoking status: Former Smoker    Quit date: 12/07/2003  . Smokeless tobacco: Never Used  . Alcohol Use: No   ROS as above  Medications reviewed. Current Outpatient Prescriptions  Medication Sig Dispense Refill  . albuterol (VENTOLIN HFA) 108 (90 BASE) MCG/ACT inhaler Inhale 2 puffs into the lungs every 4 (four) hours as needed for wheezing.  1 Inhaler  6  . B-D ULTRAFINE III SHORT PEN 31G X 8 MM MISC USE TO INJECT INSULIN  200 each  PRN  . carvedilol (COREG) 6.25 MG tablet Take 6.25 mg by mouth 2 (two) times daily with a meal.        . cetirizine (ZYRTEC) 10 MG tablet Take 10 mg by mouth daily.        . cyclobenzaprine (FLEXERIL) 10 MG tablet Take 1 tablet (10 mg total) by mouth 3 (three) times daily as needed for muscle spasms.  30 tablet  0  . glimepiride (AMARYL) 4 MG tablet Take 4 mg by mouth daily before breakfast.        . glucose blood (ACCU-CHEK AVIVA PLUS) test strip Use as instructed  200 each  PRN  . HYDROcodone-acetaminophen (NORCO) 10-325 MG per tablet Take 1 tablet by mouth every 8 (eight) hours as needed for pain.  30 tablet  0  . insulin aspart (NOVOLOG) 100 UNIT/ML  injection Inject 45 Units into the skin 3 (three) times daily before meals.  12 pen  6  . insulin glargine (LANTUS) 100 UNIT/ML injection Inject 95 Units into the skin 2 (two) times daily.  20 pen  12  . lisinopril (PRINIVIL,ZESTRIL) 10 MG tablet Take 10 mg by mouth daily.        . mupirocin ointment (BACTROBAN) 2 % APPLY TO AFFECTED AREA THREE TIMES A DAY  30 g  6  . nystatin (MYCOSTATIN) powder Apply topically daily.        . pantoprazole (PROTONIX) 40 MG tablet Take 40 mg by mouth daily.        Marland Kitchen PROCTOSOL HC 2.5 % rectal cream USE 1 APPLICATION TO RECTUM TWICE DAILY AS NEEDED  28.35 g  6  . SYMBICORT 160-4.5 MCG/ACT inhaler INHALE 2 PUFFS INTO THE LUNGS 2 TIMES DAILY  1 Inhaler  99  . traMADol (ULTRAM) 50 MG tablet TAKE 1 TABLET BY MOUTH EVERY 12 HOURS AS NEEDED  30 tablet  PRN  . zolpidem (AMBIEN) 5 MG tablet Take 1 tablet (5 mg total) by mouth at bedtime as needed.  90 tablet  1  . DISCONTD: cyclobenzaprine (FLEXERIL) 10 MG tablet Take 1 tablet (10 mg total) by mouth  3 (three) times daily as needed for muscle spasms.  30 tablet  0  . DISCONTD: insulin aspart (NOVOLOG) 100 UNIT/ML injection Inject 35 Units into the skin 3 (three) times daily before meals.  12 pen  6  . DISCONTD: insulin glargine (LANTUS) 100 UNIT/ML injection Inject 85 Units into the skin 2 (two) times daily.  20 pen  12    Exam:  BP 132/89  Pulse 102  Ht 5\' 2"  (1.575 m)  Wt 372 lb (168.738 kg)  BMI 68.04 kg/m2 Gen: Well NAD, morbidly obese HEENT: EOMI,  MMM Lungs: CTABL Nl WOB Heart: RRR no MRG Abd: NABS, NT, ND Exts: Non edematous BL  LE, warm and well perfused.  Knee: No significant effusions mildly tender on medial joint lines.  Crepitations with range of motion.   No results found for this or any previous visit (from the past 72 hour(s)).

## 2012-05-22 NOTE — Assessment & Plan Note (Signed)
12 pound weight loss!  Plan to continue exercise

## 2012-05-22 NOTE — Assessment & Plan Note (Signed)
>>  ASSESSMENT AND PLAN FOR INSULIN DEPENDENT TYPE 2 DIABETES MELLITUS ([name]) WRITTEN ON 05/22/2012  3:01 PM BY COREY, EVAN S, MD  Blood sugar slightly improved. Plan to increase Lantus to 95 units twice a day and NovoLog to 45 units twice a day. This is an 18% increase over to insulin.  We'll followup in one month. Warned about hypoglycemia symptoms

## 2012-05-22 NOTE — Assessment & Plan Note (Signed)
Blood sugar slightly improved. Plan to increase Lantus to 95 units twice a day and NovoLog to 45 units twice a day. This is an 18% increase over to insulin.  We'll followup in one month. Warned about hypoglycemia symptoms

## 2012-05-31 ENCOUNTER — Emergency Department (HOSPITAL_COMMUNITY)
Admission: EM | Admit: 2012-05-31 | Discharge: 2012-06-01 | Disposition: A | Discharge status: Home / Self Care | Payer: Medicaid Other | Attending: Emergency Medicine | Admitting: Emergency Medicine

## 2012-05-31 ENCOUNTER — Emergency Department (HOSPITAL_COMMUNITY): Payer: Medicaid Other

## 2012-05-31 ENCOUNTER — Encounter (HOSPITAL_COMMUNITY): Payer: Self-pay | Admitting: *Deleted

## 2012-05-31 DIAGNOSIS — K219 Gastro-esophageal reflux disease without esophagitis: Secondary | ICD-10-CM | POA: Insufficient documentation

## 2012-05-31 DIAGNOSIS — Z87891 Personal history of nicotine dependence: Secondary | ICD-10-CM | POA: Insufficient documentation

## 2012-05-31 DIAGNOSIS — M549 Dorsalgia, unspecified: Secondary | ICD-10-CM

## 2012-05-31 DIAGNOSIS — Z794 Long term (current) use of insulin: Secondary | ICD-10-CM | POA: Insufficient documentation

## 2012-05-31 DIAGNOSIS — F319 Bipolar disorder, unspecified: Secondary | ICD-10-CM | POA: Insufficient documentation

## 2012-05-31 DIAGNOSIS — I10 Essential (primary) hypertension: Secondary | ICD-10-CM | POA: Insufficient documentation

## 2012-05-31 MED ORDER — MORPHINE SULFATE 4 MG/ML IJ SOLN
4.0000 mg | Freq: Once | INTRAMUSCULAR | Status: AC
  Administered 2012-06-01: 4 mg via INTRAVENOUS
  Filled 2012-05-31: qty 1

## 2012-05-31 NOTE — ED Provider Notes (Signed)
History     CSN: [id number]  Arrival date & time 05/31/12  2148   First MD Initiated Contact with Patient 05/31/12 2240      Chief Complaint  Patient presents with  . Generalized pain    HPI  History provided by the patient. Patient is a 36 year old female with history of hypertension, diabetes, morbid obesity who presents with complaints of low back pains that began around 7 PM. Pain is described as sharp and severe and radiates down bilateral lower extremities to her feet. Patient denies doing any strenuous activity or having any injury or trauma prior to the pain. Pain is worse with any kinds of movements or standing and walking. Pain improved slightly with sitting it in reclined position. Patient has not taken any medications for her symptoms. Symptoms are described as severe. Patient denies any other aggravating or alleviating factors. She denies any associated symptoms. Denies any fever, chills, sweats, abdominal pain, shortness of breath, dysuria, urinary frequency, hematuria, diarrhea or constipation. Patient denies any urinary or fecal incontinence, urinary retention or perineal numbness. She denies any numbness or weakness in lower extremities.  Patient also reports possible syncopal episode. Patient states that she got up to the bathroom was having some significant pains and was on the floor when family came in to find her. Patient states that she may have blacked out. She denies any injury from the fall. She denies any chest pain or heart palpitations or shortness of breath.     Past Medical History  Diagnosis Date  . Morbid obesity   . Extreme obesity with alveolar hypoventilation   . Morbid obesity   . Alveolar hypoventilation   . Diabetes mellitus   . Asthma   . GERD (gastroesophageal reflux disease)   . Rectal fissure   . Hypertension   . Costochondritis   . Cellulitis 08/2010-08/2011  . Depression   . Bipolar 2 disorder     Past Surgical History  Procedure Date    . Carpal tunnel release   . Bil knee surgery     History reviewed. No pertinent family history.  History  Substance Use Topics  . Smoking status: Former Smoker    Quit date: 12/07/2003  . Smokeless tobacco: Never Used  . Alcohol Use: No    OB History    Grav Para Term Preterm Abortions TAB SAB Ect Mult Living                  Review of Systems  Constitutional: Negative for fever and chills.  HENT: Negative for neck pain.   Respiratory: Negative for shortness of breath.   Cardiovascular: Negative for chest pain.  Gastrointestinal: Negative for nausea, vomiting and abdominal pain.  Genitourinary: Negative for dysuria, frequency, hematuria, flank pain, vaginal bleeding and vaginal discharge.  Musculoskeletal: Positive for back pain.    Allergies  Nubain  Home Medications   Current Outpatient Rx  Name Route Sig Dispense Refill  . ALBUTEROL SULFATE HFA 108 (90 BASE) MCG/ACT IN AERS Inhalation Inhale 2 puffs into the lungs every 4 (four) hours as needed for wheezing. 1 Inhaler 6  . BUDESONIDE-FORMOTEROL FUMARATE 160-4.5 MCG/ACT IN AERO Inhalation Inhale 2 puffs into the lungs 2 (two) times daily.    Marland Kitchen CARVEDILOL 6.25 MG PO TABS Oral Take 6.25 mg by mouth 2 (two) times daily with a meal.     . CETIRIZINE HCL 10 MG PO TABS Oral Take 10 mg by mouth daily.      . CYCLOBENZAPRINE  HCL 10 MG PO TABS Oral Take 10 mg by mouth 3 (three) times daily as needed.    Marland Kitchen GLIMEPIRIDE 4 MG PO TABS Oral Take 4 mg by mouth daily before breakfast.      . HYDROCODONE-ACETAMINOPHEN 10-325 MG PO TABS Oral Take 1 tablet by mouth every 8 (eight) hours as needed.    . INSULIN ASPART 100 UNIT/ML [place] SOLN Subcutaneous Inject 45 Units into the skin 3 (three) times daily before meals.    . INSULIN GLARGINE 100 UNIT/ML [place] SOLN Subcutaneous Inject 95 Units into the skin 2 (two) times daily.    Marland Kitchen LISINOPRIL 10 MG PO TABS Oral Take 10 mg by mouth daily.      Marland Kitchen MUPIROCIN 2 % EX OINT  APPLY TO AFFECTED AREA  THREE TIMES A DAY 30 g 6  . NYSTATIN 100000 UNIT/GM EX POWD Topical Apply 1 Units topically daily. Applies under stomach    . PANTOPRAZOLE SODIUM 40 MG PO TBEC Oral Take 40 mg by mouth daily.      . TRAMADOL HCL 50 MG PO TABS Oral Take 50 mg by mouth every 6 (six) hours as needed. pain    . ZOLPIDEM TARTRATE 5 MG PO TABS Oral Take 5 mg by mouth at bedtime as needed.      BP 127/87  Pulse 106  Temp 97.9 F (36.6 C) (Oral)  Resp 20  SpO2 97%  Physical Exam  Nursing note and vitals reviewed. Constitutional: She is oriented to person, place, and time. She appears well-developed and well-nourished. No distress.       Morbidly obese  HENT:  Head: Normocephalic.  Cardiovascular: Normal rate and regular rhythm.   No murmur heard. Pulmonary/Chest: Effort normal and breath sounds normal. No respiratory distress. She has no wheezes. She has no rales.  Abdominal: Soft. There is no tenderness. There is no rebound, no guarding and no CVA tenderness.  Musculoskeletal: She exhibits no edema and no tenderness.       Cervical back: Normal.       Thoracic back: Normal.       Lumbar back: She exhibits decreased range of motion and tenderness.       Back:  Neurological: She is alert and oriented to person, place, and time.  Skin: Skin is warm and dry. No rash noted.  Psychiatric: She has a normal mood and affect. Her behavior is normal.    ED Course  Procedures   Results for orders placed during the hospital encounter of 05/31/12  URINALYSIS, ROUTINE W REFLEX MICROSCOPIC      Component Value Range   Color, Urine YELLOW  YELLOW   APPearance CLOUDY (*) CLEAR   Specific Gravity, Urine 1.038 (*) 1.005 - 1.030   pH 6.0  5.0 - 8.0   Glucose, UA >1000 (*) NEGATIVE mg/dL   Hgb urine dipstick LARGE (*) NEGATIVE   Bilirubin Urine NEGATIVE  NEGATIVE   Ketones, ur NEGATIVE  NEGATIVE mg/dL   Protein, ur 132 (*) NEGATIVE mg/dL   Urobilinogen, UA 0.2  0.0 - 1.0 mg/dL   Nitrite NEGATIVE  NEGATIVE    Leukocytes, UA NEGATIVE  NEGATIVE  POCT PREGNANCY, URINE      Component Value Range   Preg Test, Ur NEGATIVE  NEGATIVE  URINE MICROSCOPIC-ADD ON      Component Value Range   Squamous Epithelial / LPF FEW (*) RARE   WBC, UA 0-2  <3 WBC/hpf   RBC / HPF 3-6  <3 RBC/hpf  Urine-Other FEW YEAST    POCT I-STAT, CHEM 8      Component Value Range   Sodium 137  135 - 145 mEq/L   Potassium 3.9  3.5 - 5.1 mEq/L   Chloride 99  96 - 112 mEq/L   BUN 8  6 - 23 mg/dL   Creatinine, Ser 8.29 (*) 0.50 - 1.10 mg/dL   Glucose, Bld 562 (*) 70 - 99 mg/dL   Calcium, Ion 1.30  8.65 - 1.32 mmol/L   TCO2 26  0 - 100 mmol/L   Hemoglobin 15.3 (*) 12.0 - 15.0 g/dL   HCT 78.4  69.6 - 29.5 %      Dg Chest 2 View  05/31/2012  *RADIOLOGY REPORT*  Clinical Data: Chest pain and shortness of breath.  CHEST - 2 VIEW  Comparison: 03/17/2012  Findings: Slightly shallow inspiration.  Normal heart size and pulmonary vascularity.  No focal airspace consolidation in the lungs.  No blunting of costophrenic angles.  No pneumothorax. Degenerative changes in the thoracic spine.  Degenerative changes in the shoulders.  IMPRESSION: No evidence of active pulmonary disease.  Original Report Authenticated By: Marlon Pel, M.D.   Dg Lumbar Spine Complete  05/31/2012  *RADIOLOGY REPORT*  Clinical Data: Chest pain and shortness of breath.  Low back pain radiating to both legs.  LUMBAR SPINE - COMPLETE 4+ VIEW  Comparison: CT abdomen and pelvis 12/01/2011  Findings: Five lumbar type vertebra.  Normal alignment of the lumbar vertebra and facet joints.  Mild hypertrophic degenerative changes at the endplates.  Intervertebral disc space heights are preserved.  No vertebral compression deformities.  No focal bone lesion or bone destruction.  Bone cortex and trabecular architecture appear intact.  IMPRESSION: Mild degenerative changes.  No displaced fractures identified.  Original Report Authenticated By: Marlon Pel, M.D.     1.  Back pain       MDM  11:10PM patient seen and evaluated. Patient no acute distress.   Patient feeling better after medications. For concerning lab tests or EKG for possible syncopal episode.  Patient feels ready to be discharged home. She will plan to followup with PCP for continued evaluation.    Date: 06/01/2012  Rate: 105  Rhythm: sinus tachycardia  QRS Axis: normal  Intervals: normal  ST/T Wave abnormalities: nonspecific T wave changes  Conduction Disutrbances:none  Narrative Interpretation:   Old EKG Reviewed: Unchanged from 03/17/2012    Angus Seller, PA 06/01/12 [phone]

## 2012-05-31 NOTE — ED Notes (Signed)
Per EMS report, pt from home: since 6pm tonight, pt experiencing generalize pain, described as sharp.  Upon arrival, EMS found pt lying on the floor and complains of pain.  Getting on the stretcher made her legs really hurt.  No pain response when EMS palpated her back.  Pt reports possible syncopal episode. A/o x4. Enroute: no c/o and sitting up seems to make her pain better.  Rates pain 8/10. Pt got up off stretcher and sat down on bed here. BP 151/105, CBG 420 Hx of HTN, DM, 18g R AC.

## 2012-05-31 NOTE — ED Notes (Signed)
ZOX:[id number]<AV> Expected date:05/31/12<BR> Expected time: 9:45 PM<BR> Means of arrival:Ambulance<BR> Comments:<BR> Generalized pain

## 2012-06-01 ENCOUNTER — Ambulatory Visit: Payer: Medicaid Other | Admitting: Family Medicine

## 2012-06-01 LAB — URINALYSIS, ROUTINE W REFLEX MICROSCOPIC
Glucose, UA: >1000 mg/dL — AB
Leukocytes, UA: NEGATIVE
Nitrite: NEGATIVE
Specific Gravity, Urine: 1.038 — ABNORMAL HIGH (ref 1.005–1.030)
pH: 6 (ref 5.0–8.0)

## 2012-06-01 LAB — POCT I-STAT, CHEM 8
Calcium, Ion: 1.16 mmol/L (ref 1.12–1.32)
Creatinine, Ser: 0.4 mg/dL — ABNORMAL LOW (ref 0.50–1.10)
Glucose, Bld: 434 mg/dL — ABNORMAL HIGH (ref 70–99)
HCT: 45 % (ref 36.0–46.0)
Hemoglobin: 15.3 g/dL — ABNORMAL HIGH (ref 12.0–15.0)

## 2012-06-01 LAB — POCT PREGNANCY, URINE: Preg Test, Ur: NEGATIVE

## 2012-06-01 LAB — URINE MICROSCOPIC-ADD ON

## 2012-06-01 MED ORDER — DIAZEPAM 5 MG PO TABS: 5.0000 mg | ORAL_TABLET | Freq: Four times a day (QID) | ORAL | Status: AC | PRN

## 2012-06-01 MED ORDER — DIAZEPAM 5 MG/ML IJ SOLN
5.0000 mg | Freq: Once | INTRAMUSCULAR | Status: AC
  Administered 2012-06-01: 5 mg via INTRAVENOUS
  Filled 2012-06-01: qty 2

## 2012-06-01 MED ORDER — TRAMADOL HCL 50 MG PO TABS: 50.0000 mg | ORAL_TABLET | Freq: Four times a day (QID) | ORAL | Status: AC | PRN

## 2012-06-01 NOTE — ED Notes (Signed)
EKG old and new given to EDP, [place],MD. 

## 2012-06-01 NOTE — ED Provider Notes (Signed)
Medical screening examination/treatment/procedure(s) were performed by non-physician practitioner and as supervising physician I was immediately available for consultation/collaboration.   Carlisle Beers Brennyn Haisley, MD 06/01/12 2250

## 2012-06-01 NOTE — Discharge Instructions (Signed)
You were seen and evaluated for your complaints of low back pain. At this time your providers feel your symptoms are caused from muscle strain and spasms. You have been given medications to help with your symptoms. Please take these as prescribed. It is recommended that you maintain a healthy diet with regular exercise to lose weight and strength in your muscles. Please followup with a primary care provider for continued evaluation and treatment of your symptoms.     Back Pain, Adult Low back pain is very common. About 1 in 5 people have back pain.The cause of low back pain is rarely dangerous. The pain often gets better over time.About half of people with a sudden onset of back pain feel better in just 2 weeks. About 8 in 10 people feel better by 6 weeks.  CAUSES Some common causes of back pain include:  Strain of the muscles or ligaments supporting the spine.   Wear and tear (degeneration) of the spinal discs.   Arthritis.   Direct injury to the back.  DIAGNOSIS Most of the time, the direct cause of low back pain is not known.However, back pain can be treated effectively even when the exact cause of the pain is unknown.Answering your caregiver's questions about your overall health and symptoms is one of the most accurate ways to make sure the cause of your pain is not dangerous. If your caregiver needs more information, he or she may order lab work or imaging tests (X-rays or MRIs).However, even if imaging tests show changes in your back, this usually does not require surgery. HOME CARE INSTRUCTIONS For many people, back pain returns.Since low back pain is rarely dangerous, it is often a condition that people can learn to [facility] their own.   Remain active. It is stressful on the back to sit or stand in one place. Do not sit, drive, or stand in one place for more than 30 minutes at a time. Take short walks on level surfaces as soon as pain allows.Try to increase the length of time  you walk each day.   Do not stay in bed.Resting more than 1 or 2 days can delay your recovery.   Do not avoid exercise or work.Your body is made to move.It is not dangerous to be active, even though your back may hurt.Your back will likely heal faster if you return to being active before your pain is gone.   Pay attention to your body when you bend and lift. Many people have less discomfortwhen lifting if they bend their knees, keep the load close to their bodies,and avoid twisting. Often, the most comfortable positions are those that put less stress on your recovering back.   Find a comfortable position to sleep. Use a firm mattress and lie on your side with your knees slightly bent. If you lie on your back, put a pillow under your knees.   Only take over-the-counter or prescription medicines as directed by your caregiver. Over-the-counter medicines to reduce pain and inflammation are often the most helpful.Your caregiver may prescribe muscle relaxant drugs.These medicines help dull your pain so you can more quickly return to your normal activities and healthy exercise.   Put ice on the injured area.   Put ice in a plastic bag.   Place a towel between your skin and the bag.   Leave the ice on for 15 to 20 minutes, 3 to 4 times a day for the first 2 to 3 days. After that, ice and heat may  be alternated to reduce pain and spasms.   Ask your caregiver about trying back exercises and gentle massage. This may be of some benefit.   Avoid feeling anxious or stressed.Stress increases muscle tension and can worsen back pain.It is important to recognize when you are anxious or stressed and learn ways to manage it.Exercise is a great option.  SEEK MEDICAL CARE IF:  You have pain that is not relieved with rest or medicine.   You have pain that does not improve in 1 week.   You have new symptoms.   You are generally not feeling well.  SEEK IMMEDIATE MEDICAL CARE IF:   You have pain  that radiates from your back into your legs.   You develop new bowel or bladder control problems.   You have unusual weakness or numbness in your arms or legs.   You develop nausea or vomiting.   You develop abdominal pain.   You feel faint.  Document Released: 11/22/2005 Document Revised: 11/11/2011 Document Reviewed: 04/12/2011 [employer] Patient Information 2012 [place], [place].      Back Exercises Back exercises help treat and prevent back injuries. The goal of back exercises is to increase the strength of your abdominal and back muscles and the flexibility of your back. These exercises should be started when you no longer have back pain. Back exercises include:  Pelvic Tilt. Lie on your back with your knees bent. Tilt your pelvis until the lower part of your back is against the floor. Hold this position 5 to 10 sec and repeat 5 to 10 times.   Knee to Chest. Pull first 1 knee up against your chest and hold for 20 to 30 seconds, repeat this with the other knee, and then both knees. This may be done with the other leg straight or bent, whichever feels better.   Sit-Ups or Curl-Ups. Bend your knees 90 degrees. Start with tilting your pelvis, and do a partial, slow sit-up, lifting your trunk only 30 to 45 degrees off the floor. Take at least 2 to 3 seconds for each sit-up. Do not do sit-ups with your knees out straight. If partial sit-ups are difficult, simply do the above but with only tightening your abdominal muscles and holding it as directed.   Hip-Lift. Lie on your back with your knees flexed 90 degrees. Push down with your feet and shoulders as you raise your hips a couple inches off the floor; hold for 10 seconds, repeat 5 to 10 times.   Back arches. Lie on your stomach, propping yourself up on bent elbows. Slowly press on your hands, causing an arch in your low back. Repeat 3 to 5 times. Any initial stiffness and discomfort should lessen with repetition over time.    Shoulder-Lifts. Lie face down with arms beside your body. Keep hips and torso pressed to floor as you slowly lift your head and shoulders off the floor.  Do not overdo your exercises, especially in the beginning. Exercises may cause you some mild back discomfort which lasts for a few minutes; however, if the pain is more severe, or lasts for more than 15 minutes, do not continue exercises until you see your caregiver. Improvement with exercise therapy for back problems is slow.  See your caregivers for assistance with developing a proper back exercise program. Document Released: 12/30/2004 Document Revised: 11/11/2011 Document Reviewed: 11/22/2005 [facility] Patient Information 2012 [place], [place].      RESOURCE GUIDE  Chronic Pain Problems: Contact [facility]  [phone] Patients need to be referred by their primary care doctor.  Insufficient Money for Medicine: Contact United Way:  call "211" or [facility] 641-084-2757.  No Primary Care Doctor: - Call [facility]  [phone] - can help you locate a primary care doctor that  accepts your insurance, provides certain services, etc. - Physician Referral Service- [phone]  Agencies that provide inexpensive medical care: - [name]  [phone] - [name]  [phone] - [facility]  [phone] - Women's Clinic  [phone] - Planned Parenthood  [phone] [facility]  [phone]  Medicaid-accepting [facility] Providers: - [facility]- [address]  [phone], Mon-Fri 9am-7pm, Sat 9am-1pm - [facility]- [address] [place], [address] [place]  [phone] - [facility]- [address], [address] [place]  [phone] [facility]- [address]  [phone] - Renaye Rakers- [address] [place], [address], [phone]  Only accepts [place] patients after  they have their name  applied to their card  Self Pay (no insurance) in [place]: - Sickle Cell Patients: Dr Willey Blade, [facility]  [address] [place], [phone] - [facility]- [address] [place]  [phone]       [name] [place]- [address]       -     [facility]- see information above (Speak to [employer] if you do not have insurance)       -  [facility]- [address] [place], [phone]       -  [facility]- [address],  [phone]       -  [facility]- [address] [phone]       -  Dr Julio Sicks-  [address], [place], [phone]       -  [facility]- [address] [phone]       -  [facility]- [address], [phone], also [address] [phone]       -    [facility]- [address] [place], [phone], 1st & 3rd Saturday   every month, 10am-1pm  1) Find a Doctor and Pay Out of Pocket Although you won't have to find out who is covered by your insurance plan, it is a good idea to ask around and get recommendations. You will then need to call the office and see if the doctor you have chosen will accept you as a new patient and what types of options they offer for patients who are self-pay. Some doctors offer discounts or will set up payment plans for their patients who do not have insurance, but you will need to ask so you aren't surprised when you get to your appointment.  2) Contact Your Local Health Department Not all health departments have doctors that can see patients for sick visits, but many do, so it is worth a call to see if yours does. If you don't know where your local health department is, you can check in your phone book. The CDC also has a tool to help you locate your state's health department, and many state websites also have listings of all of their local health departments.  3) Find a  Walk-in Clinic If your illness is not likely to be very severe or complicated, you may want to try a walk in clinic. These are popping up all over the country in pharmacies, drugstores, and shopping centers. They're usually staffed by nurse practitioners or physician assistants that have been trained to treat common illnesses and complaints. They're usually fairly quick and inexpensive. However, if you have serious medical issues or chronic medical problems, these are probably not your best option  STD Testing - [facility] Department of [facility], STD Clinic, [address] [place], phone [phone] or [phone].  Monday - Friday, call for an appointment. [facility] Department of [employer], STD Clinic, [address], [place], phone [phone] or [phone].  Monday - Friday, call for an appointment.  Abuse/Neglect: [facility] Child Abuse Hotline 929-180-1122 [facility] Child Abuse Hotline 534-616-4918 (After Hours)  Emergency Shelter:  [facility] (713) 179-7869  Maternity Homes: - [facility] 9203634370 - [facility] [phone]  MRSA Hotline #:   8733369562  [facility] Resources  [facility] [place]  [employer] [facility] Dept. [address]                 [address]         [address] [place] [address]  Blondell Reveal Phone:  [phone]                                  Phone:  [phone]                   Phone:  ([phone]) [phone]  [facility], [phone] - [facility]509 [phone]       -     [facility] in [place], [address],                                  [phone], [facility] Child Abuse Hotline (804) 748-8328 or (514)706-7599 (After Hours)   Behavioral Health Services  Substance Abuse Resources: - [facility] Drug [facility]  401-349-4050 - [facility] [phone] - [employer] [phone] Floydene Flock (410)[phone] - Residential & Outpatient Substance Abuse Program  (501)498-1442  Psychological Services: [facility]  [phone] Services  ([phone] - [facility], [phone] [place]. [address] [place], ACCESS LINE: (701) 203-9836 or 978-155-9554, [employer]  Dental Assistance  If unable to pay or uninsured, contact:  [facility] or [facility]. to become qualified for the adult dental clinic.  Patients with Medicaid: [facility] [phone] [address], [phone] [address], [phone]  If unable to pay, or uninsured, contact [facility] (970)211-9265) or [facility] Department [phone]  in [place], [phone] in [facility]) to become qualified for the adult dental clinic  Other Low-Cost Community Dental Services: - [facility]- [address] [place], [place], [place], [address], [phone], [address], 2nd and 4th Thursday of the month at 6:30am.  10 clients each day by appointment, can sometimes see walk-in patients if someone does not show for an appointment. [facility]- [address] [place], [place], [address], [phone] - [facility]- [address] [place], [place], [address], [phone] - [facility]- [phone] [facility] Department- [phone] [facility] Department- [phone]

## 2012-06-06 ENCOUNTER — Ambulatory Visit (INDEPENDENT_AMBULATORY_CARE_PROVIDER_SITE_OTHER): Payer: Medicaid Other | Admitting: Family Medicine

## 2012-06-06 ENCOUNTER — Telehealth: Payer: Self-pay | Admitting: Family Medicine

## 2012-06-06 ENCOUNTER — Encounter: Payer: Self-pay | Admitting: Family Medicine

## 2012-06-06 VITALS — BP 137/92 | HR 99 | Wt 375.0 lb

## 2012-06-06 DIAGNOSIS — S335XXA Sprain of ligaments of lumbar spine, initial encounter: Secondary | ICD-10-CM

## 2012-06-06 DIAGNOSIS — M25569 Pain in unspecified knee: Secondary | ICD-10-CM

## 2012-06-06 DIAGNOSIS — S39012A Strain of muscle, fascia and tendon of lower back, initial encounter: Secondary | ICD-10-CM | POA: Insufficient documentation

## 2012-06-06 DIAGNOSIS — M25561 Pain in right knee: Secondary | ICD-10-CM

## 2012-06-06 MED ORDER — HYDROCODONE-ACETAMINOPHEN 10-325 MG PO TABS: 1.0000 | ORAL_TABLET | Freq: Three times a day (TID) | ORAL | Status: AC | PRN

## 2012-06-06 MED ORDER — CYCLOBENZAPRINE HCL 10 MG PO TABS: 10.0000 mg | ORAL_TABLET | Freq: Three times a day (TID) | ORAL | Status: DC | PRN

## 2012-06-06 NOTE — Telephone Encounter (Signed)
Spoke with patient and advised her to come to office now for work in appointment.

## 2012-06-06 NOTE — Patient Instructions (Addendum)
Back Pain, Adult Low back pain is very common. About 1 in 5 people have back pain.The cause of low back pain is rarely dangerous. The pain often gets better over time.About half of people with a sudden onset of back pain feel better in just 2 weeks. About 8 in 10 people feel better by 6 weeks.  CAUSES Some common causes of back pain include:  Strain of the muscles or ligaments supporting the spine.   Wear and tear (degeneration) of the spinal discs.   Arthritis.   Direct injury to the back.  DIAGNOSIS Most of the time, the direct cause of low back pain is not known.However, back pain can be treated effectively even when the exact cause of the pain is unknown.Answering your caregiver's questions about your overall health and symptoms is one of the most accurate ways to make sure the cause of your pain is not dangerous. If your caregiver needs more information, he or she may order lab work or imaging tests (X-rays or MRIs).However, even if imaging tests show changes in your back, this usually does not require surgery. HOME CARE INSTRUCTIONS For many people, back pain returns.Since low back pain is rarely dangerous, it is often a condition that people can learn to manageon their own.   Remain active. It is stressful on the back to sit or stand in one place. Do not sit, drive, or stand in one place for more than 30 minutes at a time. Take short walks on level surfaces as soon as pain allows.Try to increase the length of time you walk each day.   Do not stay in bed.Resting more than 1 or 2 days can delay your recovery.   Do not avoid exercise or work.Your body is made to move.It is not dangerous to be active, even though your back may hurt.Your back will likely heal faster if you return to being active before your pain is gone.   Pay attention to your body when you bend and lift. Many people have less discomfortwhen lifting if they bend their knees, keep the load close to their  bodies,and avoid twisting. Often, the most comfortable positions are those that put less stress on your recovering back.   Find a comfortable position to sleep. Use a firm mattress and lie on your side with your knees slightly bent. If you lie on your back, put a pillow under your knees.   Only take over-the-counter or prescription medicines as directed by your caregiver. Over-the-counter medicines to reduce pain and inflammation are often the most helpful.Your caregiver may prescribe muscle relaxant drugs.These medicines help dull your pain so you can more quickly return to your normal activities and healthy exercise.   Put ice on the injured area.   Put ice in a plastic bag.   Place a towel between your skin and the bag.   Leave the ice on for 15 to 20 minutes, 3 to 4 times a day for the first 2 to 3 days. After that, ice and heat may be alternated to reduce pain and spasms.   Ask your caregiver about trying back exercises and gentle massage. This may be of some benefit.   Avoid feeling anxious or stressed.Stress increases muscle tension and can worsen back pain.It is important to recognize when you are anxious or stressed and learn ways to manage it.Exercise is a great option.  SEEK MEDICAL CARE IF:  You have pain that is not relieved with rest or medicine.   You have   pain that does not improve in 1 week.   You have new symptoms.   You are generally not feeling well.  SEEK IMMEDIATE MEDICAL CARE IF:   You have pain that radiates from your back into your legs.   You develop new bowel or bladder control problems.   You have unusual weakness or numbness in your arms or legs.   You develop nausea or vomiting.   You develop abdominal pain.   You feel faint.  Document Released: 11/22/2005 Document Revised: 11/11/2011 Document Reviewed: 04/12/2011 [employer] Patient Information 2012 [employer]. 

## 2012-06-06 NOTE — Assessment & Plan Note (Signed)
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  Did have injection last time which did not help.  She continues to need the norco 10mg  bid and will be seeing SM for US guided injection in the future.

## 2012-06-06 NOTE — Assessment & Plan Note (Signed)
1) Pt had fall and required trip to the ED on 6/26 after falling down. 2) She was given tramadol/valium which she has not taken since d/c 3) Will give her Flexeril 10 mg bid for her strain and recommend ice and stretch 4) She may be a candidate for PT after acute injury

## 2012-06-06 NOTE — Assessment & Plan Note (Signed)
Pt has been trying to lose weight for over a month now.  At last visit in June, lost 12 lbs. At this visit lost three more pounds and is trying to walk everyday as well

## 2012-06-06 NOTE — Progress Notes (Signed)
Pt is a 36 y.o. who presents today for B/L knee pain x 3 months being followed by Dr. Denyse Amass in Sports Medicine.  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    Pt recently went to the ED for LBP that began after she had a fall at home.  Pt denies any current B/Bladder problems, new onset numbness, tingling, burning, muscle strength.  Pt is having pain is having in her lower back pain that does not radiate.  Pain is worse with bending forward or sitting down for a long time.  In the ED they did lumbar films showing DJD in the lumbar region.  They did d/c on her tramadol which hasn't helped with her pain as well as valium which she has not taken because she is on chronic flexeril for her spasms.   Past Medical History  Diagnosis Date  . Morbid obesity   . Extreme obesity with alveolar hypoventilation   . Morbid obesity   . Alveolar hypoventilation   . Diabetes mellitus   . Asthma   . GERD (gastroesophageal reflux disease)   . Rectal fissure   . Hypertension   . Costochondritis   . Cellulitis 08/2010-08/2011  . Depression   . Bipolar 2 disorder      Occupational History  . Disabled    Social History Main Topics  . Smoking status: Former Smoker    Quit date: 12/07/2003  . Smokeless tobacco: Never Used  . Alcohol Use: No  . Drug Use: No  . Sexually Active: Yes    Birth Control/ Protection: None    Reviewed Medications    Physical Exam Filed Vitals:   06/06/12 1523  BP: 137/92  Pulse: 99     Gen: NAD, Well nourished, Well developed HEENT: PERLA, EOMI, [place]/AT Neck: no JVD Cardio: RRR, No murmurs/gallops/rubs Lungs: CTA, no wheezes, rhonchi, crackles Abd: NABS, soft nontender nondistended MSK: ROM decreased to 20 degrees forward bending and 20 degrees backward bending.  Decreased SB/Rotation to R and L as well.  TTP B/L knees with obvious valgus deformities Neuro: CN 2-12 intact, MS 5/5  B/L UE and LE, +2 patellar and achilles relfex b/l

## 2012-06-06 NOTE — Assessment & Plan Note (Signed)
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  Did have injection last time which did not help.  She continues to need the norco 10mg  bid and will be seeing SM for US guided injection in the future.

## 2012-06-06 NOTE — Telephone Encounter (Signed)
Pt has appt tomorrow afternoon and she is asking to come in today - she is having pain with back & knees and can't wait

## 2012-06-07 ENCOUNTER — Ambulatory Visit: Payer: Medicaid Other | Admitting: Family Medicine

## 2012-06-07 ENCOUNTER — Ambulatory Visit: Payer: Medicaid Other

## 2012-06-21 ENCOUNTER — Ambulatory Visit: Payer: Medicaid Other | Admitting: Family Medicine

## 2012-06-21 ENCOUNTER — Emergency Department (HOSPITAL_COMMUNITY)
Admission: EM | Admit: 2012-06-21 | Discharge: 2012-06-21 | Discharge status: Against Medical Advice | Payer: Medicaid Other | Attending: Emergency Medicine | Admitting: Emergency Medicine

## 2012-06-21 ENCOUNTER — Encounter (HOSPITAL_COMMUNITY): Payer: Self-pay | Admitting: *Deleted

## 2012-06-21 DIAGNOSIS — R109 Unspecified abdominal pain: Secondary | ICD-10-CM | POA: Insufficient documentation

## 2012-06-21 LAB — CBC WITH DIFFERENTIAL/PLATELET
Basophils Relative: 0 % (ref 0–1)
Eosinophils Absolute: 0.2 10*3/uL (ref 0.0–0.7)
Eosinophils Relative: 3 % (ref 0–5)
Hemoglobin: 15.6 g/dL — ABNORMAL HIGH (ref 12.0–15.0)
Lymphs Abs: 4.1 10*3/uL — ABNORMAL HIGH (ref 0.7–4.0)
MCH: 31 pg (ref 26.0–34.0)
MCHC: 35.1 g/dL (ref 30.0–36.0)
MCV: 88.1 fL (ref 78.0–100.0)
Monocytes Relative: 7 % (ref 3–12)
Neutrophils Relative %: 42 % — ABNORMAL LOW (ref 43–77)
Platelets: 297 10*3/uL (ref 150–400)
RBC: 5.04 MIL/uL (ref 3.87–5.11)

## 2012-06-21 LAB — URINALYSIS, ROUTINE W REFLEX MICROSCOPIC
Bilirubin Urine: NEGATIVE
Hgb urine dipstick: NEGATIVE
Ketones, ur: 15 mg/dL — AB
Nitrite: NEGATIVE
Specific Gravity, Urine: 1.025 (ref 1.005–1.030)
Urobilinogen, UA: 0.2 mg/dL (ref 0.0–1.0)

## 2012-06-21 LAB — COMPREHENSIVE METABOLIC PANEL
Albumin: 3.2 g/dL — ABNORMAL LOW (ref 3.5–5.2)
BUN: 9 mg/dL (ref 6–23)
Calcium: 9.5 mg/dL (ref 8.4–10.5)
Creatinine, Ser: 0.39 mg/dL — ABNORMAL LOW (ref 0.50–1.10)
GFR calc Af Amer: >90 mL/min (ref >90)
Glucose, Bld: 350 mg/dL — ABNORMAL HIGH (ref 70–99)
Total Protein: 8.1 g/dL (ref 6.0–8.3)

## 2012-06-21 LAB — URINE MICROSCOPIC-ADD ON

## 2012-06-21 LAB — GLUCOSE, CAPILLARY
Glucose-Capillary: 347 mg/dL — ABNORMAL HIGH (ref 70–99)
Glucose-Capillary: 342 mg/dL — ABNORMAL HIGH (ref 70–99)

## 2012-06-21 MED ORDER — ONDANSETRON 4 MG PO TBDP
8.0000 mg | ORAL_TABLET | Freq: Once | ORAL | Status: AC
  Administered 2012-06-21: 8 mg via ORAL
  Filled 2012-06-21: qty 2

## 2012-06-21 NOTE — ED Notes (Signed)
Pt experiencing bil lower quadrant abd pain x 2 days that increases every time she eats.  Denies changes in bowel/bladder habits.

## 2012-06-22 ENCOUNTER — Ambulatory Visit (INDEPENDENT_AMBULATORY_CARE_PROVIDER_SITE_OTHER): Payer: Medicaid Other | Admitting: Family Medicine

## 2012-06-22 ENCOUNTER — Encounter: Payer: Self-pay | Admitting: Family Medicine

## 2012-06-22 VITALS — BP 163/100 | HR 105 | Temp 97.7°F | Ht 62.0 in | Wt 370.6 lb

## 2012-06-22 DIAGNOSIS — R1013 Epigastric pain: Secondary | ICD-10-CM | POA: Insufficient documentation

## 2012-06-22 NOTE — Progress Notes (Signed)
S: Pt comes in today for SDA for stomach pain and unable to eat.  Woke up 2 days ago with stomach pain, ate breakfast and made the pain worse.  Eating later that day made the pain ever worse.  Still hurting yesterday morning-- tried to eat some fries and after 2 had very bad pain.  Went to ED yesterday evening because pain was 8/10, but left because wait time was very long.  Pain right now is 6/10.  Pain is cramping, starts in epigastric area and radiates down, does not radiate to back or chest.  Tried eating this morning, but was unable.  + nausea last night but no vomiting.  No diarrhea, last BM was yesterday. No fevers/chills.  Has been able to drink and keep down fluids and medicines.  Has never had anything like this before.  Is taking her protonix.  Has not tried any other medicines.  No blood in stools, no melena.  Denies OTC NSAIDs, denies alcohol.   CBG in ED last night was >300, have been 300's at home lately (normal for her).  Not taking novolog b/c not eating, but has been taking lantus.     ROS: Per HPI  History  Smoking status  . Former Smoker  . Quit date: 12/07/2003  Smokeless tobacco  . Never Used    O:  Filed Vitals:   06/22/12 0852  BP: 163/100  Pulse: 105  Temp: 97.7 F (36.5 C)    Gen: NAD CV: RRR, no murmur Pulm: CTA bilat, no wheezes or crackles Abd: soft, + tenderness in epigastric area, no RUQ tenderness, +normal BS Ext: Warm, no chronic skin changes, no edema   A/P: 36 y.o. female p/w epigastric pain -See problem list -f/u in 1-4 days

## 2012-06-22 NOTE — Patient Instructions (Addendum)
It was good to see you today, I'm sorry you're having this pain.  I think it could be a few different things, but we treat all of them the same way. I am drawing one extra lab that they did not get in the ED last night to check for pancreatitis. I also want you to go and have an ultrasound done of your stomach to see if you have gallstones. I want you to take your protonix TWICE a day, every day, for the next 4 weeks.  I also want you to restrict your diet- meaning, drinks LOTS of non-carbonated fluids (water is really the best).  Do not eat anything solid today or tomorrow-- but, you can have things like soup broth, jello, things that are easy on your stomach and not touch to digest.  AVOID greasy foods, soda, and alcohol for at least the next few days.  Let your stomach guide how much you can advance your diet (meaning, as your pain gets better, try to add back a few light solid things while continuing to avoid greasy foods).  Come back tomorrow if your pain is getting worse or if you start having new symptoms (throwing up, blood in your stool, etc). Otherwise, come back Monday for follow up.   Acute Pancreatitis Acute pancreatitis is the sudden irritation (inflammation) of the pancreas. The pancreas produces digestive juices or enzymes that break down food. The pancreas also makes 2 hormones that control your blood sugar. If the pancreas is irritated, the enzymes attack internal structures and tissues become damaged. When this happens, the pancreas fails to make new enzymes that break down food. HOME CARE  Eat small meals often. Avoid fatty, greasy, and fried foods.   Follow diet instructions given to you by your doctor.   Avoid foods or drinks that may have started the problem (like alcohol).   Drink enough fluids to keep your pee (urine) clear or pale yellow.   Only take medicine as told by your doctor.   Rest often.   Check your blood sugar at home if you are told to.   Keep all  your follow-up visits. This is very important.  GET HELP RIGHT AWAY IF:   You do not get better in the time your doctor said you would.   You have pain, weakness, or feel sick to your stomach (nauseous).   You recover but then have another episode of pain.   You are unable to eat or keep liquids down.   Your pain gets worse or changes.   You have a temperature by mouth above 102 F (38.9 C), not controlled by medicine.   Your skin or the white part of your eyes look yellow.   You start to throw up (vomit).   You feel dizzy or pass out (faint).   Your blood sugar is high (over 300).  MAKE SURE YOU:   Understand these instructions.   Will watch your condition.   Will get help right away if you are not doing well or get worse.  Document Released: 05/10/2008 Document Revised: 11/11/2011 Document Reviewed: 05/10/2008 [facility] Patient Information 2012 [place], [place].

## 2012-06-22 NOTE — Assessment & Plan Note (Signed)
Gastritis vs pancreatitis vs cholecystis vs PUD.  Labs from ED last night relatively unremarkable, normal LFTs, normal alk phos, no leukocytosis.  Will get lipase and RUQ U/S to r/o pancreatitis and gallstones, respectively.  Given diet restrictions and encouraged po intake.  Pt denies wanting pain medications at this time.  Encouraged pt to be seen tomorrow if pain is worsening or for new symptoms, otherwise f/u Monday.

## 2012-06-23 ENCOUNTER — Other Ambulatory Visit: Payer: Medicaid Other

## 2012-06-26 ENCOUNTER — Observation Stay (HOSPITAL_COMMUNITY)
Admission: EM | Admit: 2012-06-26 | Discharge: 2012-06-27 | DRG: 313 | Disposition: A | Discharge status: Home / Self Care | Payer: Medicaid Other | Attending: Family Medicine | Admitting: Family Medicine

## 2012-06-26 ENCOUNTER — Telehealth: Payer: Self-pay | Admitting: Emergency Medicine

## 2012-06-26 DIAGNOSIS — R0789 Other chest pain: Secondary | ICD-10-CM

## 2012-06-26 DIAGNOSIS — E119 Type 2 diabetes mellitus without complications: Secondary | ICD-10-CM | POA: Diagnosis present

## 2012-06-26 DIAGNOSIS — Z794 Long term (current) use of insulin: Secondary | ICD-10-CM

## 2012-06-26 DIAGNOSIS — Z6841 Body Mass Index (BMI) 40.0 and over, adult: Secondary | ICD-10-CM

## 2012-06-26 DIAGNOSIS — Z87891 Personal history of nicotine dependence: Secondary | ICD-10-CM

## 2012-06-26 DIAGNOSIS — E662 Morbid (severe) obesity with alveolar hypoventilation: Secondary | ICD-10-CM | POA: Diagnosis present

## 2012-06-26 DIAGNOSIS — E118 Type 2 diabetes mellitus with unspecified complications: Secondary | ICD-10-CM

## 2012-06-26 DIAGNOSIS — E785 Hyperlipidemia, unspecified: Secondary | ICD-10-CM | POA: Diagnosis present

## 2012-06-26 DIAGNOSIS — F3189 Other bipolar disorder: Secondary | ICD-10-CM | POA: Diagnosis present

## 2012-06-26 DIAGNOSIS — E1151 Type 2 diabetes mellitus with diabetic peripheral angiopathy without gangrene: Secondary | ICD-10-CM | POA: Diagnosis present

## 2012-06-26 DIAGNOSIS — R002 Palpitations: Secondary | ICD-10-CM | POA: Diagnosis present

## 2012-06-26 DIAGNOSIS — IMO0002 Reserved for concepts with insufficient information to code with codable children: Secondary | ICD-10-CM | POA: Diagnosis present

## 2012-06-26 DIAGNOSIS — E1149 Type 2 diabetes mellitus with other diabetic neurological complication: Secondary | ICD-10-CM | POA: Diagnosis present

## 2012-06-26 DIAGNOSIS — Z79899 Other long term (current) drug therapy: Secondary | ICD-10-CM

## 2012-06-26 DIAGNOSIS — G4733 Obstructive sleep apnea (adult) (pediatric): Secondary | ICD-10-CM | POA: Diagnosis present

## 2012-06-26 DIAGNOSIS — I209 Angina pectoris, unspecified: Secondary | ICD-10-CM | POA: Diagnosis present

## 2012-06-26 DIAGNOSIS — J45909 Unspecified asthma, uncomplicated: Secondary | ICD-10-CM | POA: Diagnosis present

## 2012-06-26 DIAGNOSIS — K219 Gastro-esophageal reflux disease without esophagitis: Secondary | ICD-10-CM | POA: Diagnosis present

## 2012-06-26 DIAGNOSIS — R079 Chest pain, unspecified: Secondary | ICD-10-CM

## 2012-06-26 DIAGNOSIS — E1140 Type 2 diabetes mellitus with diabetic neuropathy, unspecified: Secondary | ICD-10-CM | POA: Diagnosis present

## 2012-06-26 DIAGNOSIS — I498 Other specified cardiac arrhythmias: Secondary | ICD-10-CM | POA: Diagnosis present

## 2012-06-26 DIAGNOSIS — I10 Essential (primary) hypertension: Secondary | ICD-10-CM | POA: Diagnosis present

## 2012-06-26 MED ORDER — ASPIRIN 325 MG PO TABS: 325.0000 mg | ORAL_TABLET | ORAL | Status: DC

## 2012-06-26 MED ORDER — NITROGLYCERIN 0.4 MG SL SUBL: 0.4000 mg | SUBLINGUAL_TABLET | SUBLINGUAL | Status: DC | PRN

## 2012-06-26 NOTE — ED Notes (Signed)
Patient currently sitting up in bed; no respiratory or acute distress noted.  Patient updated on plan of care; informed patient that we are currently waiting on lab results to come back.  Patient has no other questions or concerns at this time; will continue to monitor.

## 2012-06-26 NOTE — ED Notes (Signed)
C?o chest pain that began after eating tonight pain relieved with nitro, given 324 ASA

## 2012-06-26 NOTE — Telephone Encounter (Signed)
Patient called the emergency line with left sided chest pain radiating down her arm for the last 30-45 minutes.  No associated nausea, vomiting, dizziness.  States that this is different from her reflux pain.  Given risk factors of poorly controlled diabetes, hypertension and hyperlipidemia, recommended that she go to the ED to be evaluated for cardiac cause of chest pain.

## 2012-06-26 NOTE — ED Notes (Signed)
Draw and hold rainbow not needed; phlebotomy already drawn rainbow.

## 2012-06-27 ENCOUNTER — Emergency Department (HOSPITAL_COMMUNITY): Payer: Medicaid Other

## 2012-06-27 ENCOUNTER — Encounter (HOSPITAL_COMMUNITY): Payer: Self-pay | Admitting: Nurse Practitioner

## 2012-06-27 DIAGNOSIS — R079 Chest pain, unspecified: Secondary | ICD-10-CM

## 2012-06-27 DIAGNOSIS — I209 Angina pectoris, unspecified: Secondary | ICD-10-CM | POA: Diagnosis present

## 2012-06-27 LAB — BASIC METABOLIC PANEL
BUN: 9 mg/dL (ref 6–23)
CO2: 27 mEq/L (ref 19–32)
Calcium: 8.8 mg/dL (ref 8.4–10.5)
Glucose, Bld: 332 mg/dL — ABNORMAL HIGH (ref 70–99)
Sodium: 135 mEq/L (ref 135–145)

## 2012-06-27 LAB — CBC
HCT: 40.8 % (ref 36.0–46.0)
Hemoglobin: 13.8 g/dL (ref 12.0–15.0)
MCH: 30.2 pg (ref 26.0–34.0)
MCHC: 33.8 g/dL (ref 30.0–36.0)
RBC: 4.57 MIL/uL (ref 3.87–5.11)

## 2012-06-27 LAB — D-DIMER, QUANTITATIVE: D-Dimer, Quant: 0.5 ug/mL-FEU — ABNORMAL HIGH (ref 0.00–0.48)

## 2012-06-27 LAB — POCT I-STAT TROPONIN I

## 2012-06-27 LAB — CARDIAC PANEL(CRET KIN+CKTOT+MB+TROPI)
CK, MB: 1.2 ng/mL (ref 0.3–4.0)
Relative Index: INVALID (ref 0.0–2.5)
Total CK: 54 U/L (ref 7–177)

## 2012-06-27 LAB — PROTIME-INR
INR: 0.99 (ref 0.00–1.49)
Prothrombin Time: 13.3 seconds (ref 11.6–15.2)

## 2012-06-27 LAB — GLUCOSE, CAPILLARY: Glucose-Capillary: 277 mg/dL — ABNORMAL HIGH (ref 70–99)

## 2012-06-27 MED ORDER — ONDANSETRON HCL 4 MG/2ML IJ SOLN: 4.0000 mg | Freq: Four times a day (QID) | INTRAMUSCULAR | Status: DC | PRN

## 2012-06-27 MED ORDER — BUDESONIDE-FORMOTEROL FUMARATE 160-4.5 MCG/ACT IN AERO
2.0000 | INHALATION_SPRAY | Freq: Two times a day (BID) | RESPIRATORY_TRACT | Status: DC
Start: 2012-06-27 — End: 2012-06-27
  Administered 2012-06-27: 2 via RESPIRATORY_TRACT
  Filled 2012-06-27: qty 6

## 2012-06-27 MED ORDER — NITROGLYCERIN 0.4 MG SL SUBL: 0.4000 mg | SUBLINGUAL_TABLET | SUBLINGUAL | Status: DC | PRN

## 2012-06-27 MED ORDER — ASPIRIN EC 81 MG PO TBEC
81.0000 mg | DELAYED_RELEASE_TABLET | Freq: Every day | ORAL | Status: DC
  Administered 2012-06-27: 81 mg via ORAL
  Filled 2012-06-27: qty 1

## 2012-06-27 MED ORDER — HEPARIN BOLUS VIA INFUSION
4000.0000 [IU] | Freq: Once | INTRAVENOUS | Status: AC
  Administered 2012-06-27: 4000 [IU] via INTRAVENOUS
  Filled 2012-06-27: qty 4000

## 2012-06-27 MED ORDER — ONDANSETRON HCL 4 MG PO TABS: 4.0000 mg | ORAL_TABLET | Freq: Four times a day (QID) | ORAL | Status: DC | PRN

## 2012-06-27 MED ORDER — CARVEDILOL 6.25 MG PO TABS: 6.2500 mg | ORAL_TABLET | Freq: Two times a day (BID) | ORAL | Status: DC

## 2012-06-27 MED ORDER — NITROGLYCERIN 0.4 MG SL SUBL
0.4000 mg | SUBLINGUAL_TABLET | SUBLINGUAL | Status: DC | PRN
  Administered 2012-06-27: 0.4 mg via SUBLINGUAL

## 2012-06-27 MED ORDER — PANTOPRAZOLE SODIUM 40 MG PO TBEC
40.0000 mg | DELAYED_RELEASE_TABLET | Freq: Every day | ORAL | Status: DC
  Administered 2012-06-27: 40 mg via ORAL
  Filled 2012-06-27: qty 1

## 2012-06-27 MED ORDER — ALBUTEROL SULFATE HFA 108 (90 BASE) MCG/ACT IN AERS: 2.0000 | INHALATION_SPRAY | RESPIRATORY_TRACT | Status: DC | PRN

## 2012-06-27 MED ORDER — ASPIRIN 81 MG PO TBEC: 81.0000 mg | DELAYED_RELEASE_TABLET | Freq: Every day | ORAL | Status: AC

## 2012-06-27 MED ORDER — ENOXAPARIN SODIUM 80 MG/0.8ML ~~LOC~~ SOLN
80.0000 mg | SUBCUTANEOUS | Status: DC
  Filled 2012-06-27: qty 0.8

## 2012-06-27 MED ORDER — NYSTATIN 100000 UNIT/GM EX POWD
Freq: Every day | CUTANEOUS | Status: DC
  Filled 2012-06-27 (×2): qty 15

## 2012-06-27 MED ORDER — NITROGLYCERIN 0.4 MG SL SUBL
SUBLINGUAL_TABLET | SUBLINGUAL | Status: AC
  Filled 2012-06-27: qty 25

## 2012-06-27 MED ORDER — MORPHINE SULFATE 2 MG/ML IJ SOLN
2.0000 mg | INTRAMUSCULAR | Status: DC | PRN
  Administered 2012-06-27: 2 mg via INTRAVENOUS
  Filled 2012-06-27: qty 1

## 2012-06-27 MED ORDER — IBUPROFEN 600 MG PO TABS
600.0000 mg | ORAL_TABLET | Freq: Three times a day (TID) | ORAL | Status: DC
  Filled 2012-06-27 (×3): qty 1

## 2012-06-27 MED ORDER — HEPARIN (PORCINE) IN NACL 100-0.45 UNIT/ML-% IJ SOLN
1500.0000 [IU]/h | INTRAMUSCULAR | Status: DC
  Administered 2012-06-27: 1500 [IU]/h via INTRAVENOUS
  Filled 2012-06-27 (×2): qty 250

## 2012-06-27 MED ORDER — ACETAMINOPHEN 325 MG PO TABS
650.0000 mg | ORAL_TABLET | Freq: Four times a day (QID) | ORAL | Status: DC | PRN
Start: 2012-06-27 — End: 2012-06-27

## 2012-06-27 MED ORDER — LISINOPRIL 10 MG PO TABS
10.0000 mg | ORAL_TABLET | Freq: Every day | ORAL | Status: DC
  Administered 2012-06-27: 10 mg via ORAL
  Filled 2012-06-27: qty 1

## 2012-06-27 MED ORDER — CARVEDILOL 6.25 MG PO TABS
6.2500 mg | ORAL_TABLET | Freq: Two times a day (BID) | ORAL | Status: DC
  Administered 2012-06-27: 6.25 mg via ORAL
  Filled 2012-06-27 (×3): qty 1

## 2012-06-27 MED ORDER — SODIUM CHLORIDE 0.45 % IV SOLN
INTRAVENOUS | Status: DC
  Administered 2012-06-27: 07:00:00 via INTRAVENOUS

## 2012-06-27 MED ORDER — SODIUM CHLORIDE 0.9 % IJ SOLN: 3.0000 mL | Freq: Two times a day (BID) | INTRAMUSCULAR | Status: DC

## 2012-06-27 MED ORDER — IOHEXOL 350 MG/ML SOLN
100.0000 mL | Freq: Once | INTRAVENOUS | Status: AC | PRN
  Administered 2012-06-27: 100 mL via INTRAVENOUS

## 2012-06-27 MED ORDER — INSULIN ASPART 100 UNIT/ML ~~LOC~~ SOLN
0.0000 [IU] | SUBCUTANEOUS | Status: DC
  Administered 2012-06-27 (×2): 11 [IU] via SUBCUTANEOUS

## 2012-06-27 MED ORDER — INSULIN GLARGINE 100 UNIT/ML ~~LOC~~ SOLN
50.0000 [IU] | Freq: Two times a day (BID) | SUBCUTANEOUS | Status: DC
  Administered 2012-06-27: 50 [IU] via SUBCUTANEOUS

## 2012-06-27 MED ORDER — CYCLOBENZAPRINE HCL 10 MG PO TABS: 10.0000 mg | ORAL_TABLET | Freq: Three times a day (TID) | ORAL | Status: DC | PRN

## 2012-06-27 NOTE — ED Notes (Signed)
MD at bedside. Admitting doctor Despina Hick, MD) - at the bedside.

## 2012-06-27 NOTE — Discharge Summary (Signed)
Physician Discharge Summary  Patient ID: [name] MRN: [id number] DOB/AGE: [birth year] 36 y.o.  Admit date: 06/26/2012 Discharge date: 06/27/2012  Admission Diagnoses:  Discharge Diagnoses:  Principal Problem:  *Chest pain Active Problems:  Type 2 diabetes mellitus, uncontrolled  Morbid obesity  OBSTRUCTIVE SLEEP APNEA  ASTHMA  GERD  Issues for Follow-Up: 1. Please address any further chest pain; pt is to have outpt stress echo vs cath, per cardiology. 2. Please address diabetes management. Pt's A1c has been >14 multiple times in the past, and pt was discharged before it was drawn, this admission. 3. Please ddress hyperlipidemia, as well. Pt's last LDL was 191, and pt was discharged before a fasting panel was drawn, this admission.   Discharged Condition: stable  Hospital Course: Pt was admitted early AM on 7/23 with chest pain for several hours that radiated to her left arm that began while eating dinner; she also gave a history of fainting spells for the past several years, most recently 2-3 weeks ago. Chest pain was responsive to SL nitro x2 and one dose of morphine 2 mg IV in the ED, returned gradually througout the night, and was relieved with a third SL nitro. A d-dimer was mildly elevated in the ED, but CTA was negative for PE. The pt had no ST changes on EKG. Cardiology was consulted as the pt has several heart risk factors (morbid obesity, elevated LDL, uncontrolled diabetes) and Dr. Shirlee Latch originally planned for a dobutamine stress echo for 7/24 as pt had received a beta blocker (Coreg) around 0800, but pt declined staying in the hospital overnight and this will be rescheduled as an outpt procedure; DSE was originally planned first as the patient's morbidly obese body habitus may make imaging/heart catheterization difficult.  At time of discharge, pt was no longer complaining of active discharge and had been afebrile/hemodynamically stable for several hours. POC troponin in  the ED and cardiac enzymes x1 were negative. It was explained to her that it would be easier for her work-up to be done as an inpt if she would stay until tomorrow, but she insisted on being scheduled on an outpt basis, to which the primary team and cardiology agreed.  Consults: cardiology  Significant Diagnostic Studies:  Lab 06/26/12 2330 06/21/12 2140  WBC 11.1* 8.4  HGB 13.8 15.6*  HCT 40.8 44.4  PLT 275 297    Lab 06/26/12 2330  NA 135  K 3.9  CL 97  CO2 27  BUN 9  CREATININE 0.40*  LABGLOM --  GLUCOSE 332*  CALCIUM 8.8    Lab 06/27/12 0615  CKTOTAL 54  TROPONINI <0.30  TROPONINT --  CKMBINDEX --   Lab Results  Component Value Date   DDIMER 0.50* 06/27/2012   Disposition: 01-Home or Self Care  Discharge Orders    Future Appointments: Provider: Department: Dept Phone: Center:   07/06/2012 1:30 PM Briscoe Deutscher, DO [facility] [phone] [facility]     Future Orders Please Complete By Expires   Diet - low sodium heart healthy      Increase activity slowly        Medication List  As of 06/27/2012  4:53 PM   TAKE these medications         albuterol 108 (90 BASE) MCG/ACT inhaler   Commonly known as: PROVENTIL HFA;VENTOLIN HFA   Inhale 2 puffs into the lungs every 4 (four) hours as needed. For wheezing, shortness of breath      aspirin 81 MG EC  tablet   Take 1 tablet (81 mg total) by mouth daily.      budesonide-formoterol 160-4.5 MCG/ACT inhaler   Commonly known as: SYMBICORT   Inhale 2 puffs into the lungs 2 (two) times daily.      carvedilol 6.25 MG tablet   Commonly known as: COREG   Take 6.25 mg by mouth 2 (two) times daily with a meal.      cyclobenzaprine 10 MG tablet   Commonly known as: FLEXERIL   Take 10 mg by mouth 3 (three) times daily as needed. For pain      glimepiride 4 MG tablet   Commonly known as: AMARYL   Take 4 mg by mouth daily before breakfast.      insulin aspart 100 UNIT/ML injection   Commonly known as: novoLOG   Inject  45 Units into the skin 3 (three) times daily before meals.      insulin glargine 100 UNIT/ML injection   Commonly known as: LANTUS   Inject 95 Units into the skin 2 (two) times daily.      lisinopril 10 MG tablet   Commonly known as: PRINIVIL,ZESTRIL   Take 10 mg by mouth daily.      nitroGLYCERIN 0.4 MG SL tablet   Commonly known as: NITROSTAT   Place 1 tablet (0.4 mg total) under the tongue every 5 (five) minutes x 3 doses as needed for chest pain.      nystatin powder   Commonly known as: MYCOSTATIN   Apply 1 Units topically daily. Applies under stomach      ondansetron 4 MG tablet   Commonly known as: ZOFRAN   Take 1 tablet (4 mg total) by mouth every 6 (six) hours as needed for nausea.      pantoprazole 40 MG tablet   Commonly known as: PROTONIX   Take 40 mg by mouth daily.      traMADol 50 MG tablet   Commonly known as: ULTRAM   Take 50 mg by mouth every 6 (six) hours as needed. pain      zolpidem 5 MG tablet   Commonly known as: AMBIEN   Take 5 mg by mouth at bedtime as needed. For insomnia           Follow-up Information    Follow up with MATTHEWS,CODY, DO. Call in 1 day. (Please make a follow-up appointment within the next 1-2 weeks.)    Contact information:   [address] [place] [address] [phone]       Follow up with [facility]. (The cardiologist's office will call you with an appointment for a stress test.)          Signed: Maleny Candy, Cristal Deer 06/27/2012, 4:53 PM

## 2012-06-27 NOTE — ED Notes (Signed)
Patient currently resting quietly in bed; no respiratory or acute distress note.d  Patient updated on plan of care; informed patient that we are currently waiting on EDP to come and talk to patient about lab results.  Patient has no other questions or concerns at this time; will continue to monitor.

## 2012-06-27 NOTE — Consult Note (Signed)
CARDIOLOGY CONSULT NOTE  Patient ID: [name] MRN: [id number], DOB/AGE: [birth year]   Admit date: 06/26/2012 Date of Consult: 06/27/2012   Primary Physician: Everrett Coombe, DO Primary Cardiologist: new - seen by Golden Circle, MD  Pt. Profile  36 y/o female with PMH T2DM, HTN, OSA, morbid obesity, GERD and no known CAD that we are asked to see in consultation for chest pain.   Problem List  Past Medical History  Diagnosis Date  . Morbid obesity   . Extreme obesity with alveolar hypoventilation   . Alveolar hypoventilation   . Diabetes mellitus     a. Since ~ 2000;  B. BS 300 is her "normal"  . Asthma   . GERD (gastroesophageal reflux disease)   . Rectal fissure   . Hypertension   . Costochondritis   . Cellulitis 08/2010-08/2011  . Depression   . Bipolar 2 disorder   . Obstructive sleep apnea     Past Surgical History  Procedure Date  . Carpal tunnel release   . Bil knee surgery      Allergies  Allergies  Allergen Reactions  . Kiwi Extract Anaphylaxis  . Nubain (Nalbuphine Hcl) Other (See Comments)    Nervous...makes her feel like something is crawling on her.    HPI   36 y/o female with PMH T2DM, HTN, OSA, morbid obesity, GERD that we are asked to see in consultation for chest pain. The patient has no known history of CAD and has never been seen by a cardiologist. Echo in 2004 revealed EF 55-65%. The patient's chest pain began gradually last night ~9:00pm while she was eating tacos in her home and increased to a 10/10. The pain was sharp, located in her left chest and spread to her left arm down to her elbow. She reports palpitations and chills, but denies SOB, diaphoresis, nausea, and vomiting. She thought her symptoms may be due to reflux so she stopped eating and lay down but this provided no relief. Her chest discomfort persisted for ~1.5 hours before she called 911. On EMS arrival she chewed 1 full strength ASA and received 2 SL NTG which she says provided relief  of her chest pain (from 10/10 to 4/10). EMS brought her to the [facility] ED. She reports continued sharp chest pain over night and this morning that is intermittent, rated 4/10, and relieved with pain medication. The pain has never really gone away since ~ 9pm last night.  The patient reports previous episodes of chest pain that have never been as severe as what she experienced last night.   Workup in the hospital includes EKG in sinus tachycardia with HR in the low 100's and poor R wave progression; CE negative x 1; d dimer 0.50 and CT angio with no suggestion of pulmonary emboli; CXR shows normal heart size.   Inpatient Medications    . aspirin EC  81 mg Oral Daily  . budesonide-formoterol  2 puff Inhalation BID  . carvedilol  6.25 mg Oral BID WC  . heparin  4,000 Units Intravenous Once  . insulin aspart  0-20 Units Subcutaneous Q4H  . insulin glargine  50 Units Subcutaneous BID  . lisinopril  10 mg Oral Daily  . nitroGLYCERIN      . nystatin   Topical Daily  . pantoprazole  40 mg Oral Q1200  . sodium chloride  3 mL Intravenous Q12H  . DISCONTD: aspirin  325 mg Oral STAT  . DISCONTD: enoxaparin (LOVENOX) injection  80 mg Subcutaneous  Q24H  . DISCONTD: ibuprofen  600 mg Oral TID    Family History Family History  Problem Relation Age of Onset  . Diabetes Mother      Social History History   Social History  . Marital Status: Divorced    Spouse Name: N/A    Number of Children: N/A  . Years of Education: N/A   Occupational History  . Not on file.   Social History Main Topics  . Smoking status: Former Smoker -- 0.3 packs/day for .3 years    Types: Cigarettes    Quit date: 12/06/1993  . Smokeless tobacco: Never Used  . Alcohol Use: No  . Drug Use: No  . Sexually Active: Yes    Birth Control/ Protection: None   Other Topics Concern  . Not on file   Social History Narrative   Lives in [place] with her fiance and 34 yr old dtr.     Review of Systems  General:  +chills.  Negative for fever, night sweats or weight changes.  Cardiovascular:  +chest pain, +dyspnea on exertion, +orthopnea, +palpitations. Negative for edema and paroxysmal nocturnal dyspnea. Dermatological: No rash, lesions/masses Respiratory: No cough. +dyspnea Urologic: No hematuria, dysuria Abdominal:   No nausea, vomiting, diarrhea, bright red blood per rectum, melena, or hematemesis Neurologic:  No visual changes, wkns, changes in mental status. All other systems reviewed and are otherwise negative except as noted above.  Physical Exam  Blood pressure 139/84, pulse 99, temperature 98.8 F (37.1 C), temperature source Oral, resp. rate 20, height 5\' 2"  (1.575 m), weight 371 lb 7.6 oz (168.5 kg), last menstrual period 05/31/2012, SpO2 96.00%.  General: Pleasant, morbidly obese female in NAD Psych: Normal affect. Neuro: Alert and oriented X 3. Moves all extremities spontaneously. HEENT: Normal  Neck: Supple without bruits or JVD. Lungs:  Resp regular and unlabored, CTA bilaterally. Heart: RRR no s3, s4, or murmurs. Abdomen: Soft, obese, non-tender, non-distended, BS + x 4.  Extremities: No clubbing, cyanosis. Trace edema bilaterally. DP/PT/Radials 2+ and equal bilaterally.  Labs  POC Ti 0.00  Basename 06/27/12 0615  CKTOTAL 54  CKMB 1.2  TROPONINI <0.30   Lab Results  Component Value Date   WBC 11.1* 06/26/2012   HGB 13.8 06/26/2012   HCT 40.8 06/26/2012   MCV 89.3 06/26/2012   PLT 275 06/26/2012     Lab 06/26/12 2330 06/21/12 2140  NA 135 --  K 3.9 --  CL 97 --  CO2 27 --  BUN 9 --  CREATININE 0.40* --  CALCIUM 8.8 --  PROT -- 8.1  BILITOT -- 0.4  ALKPHOS -- 114  ALT -- 25  AST -- 19  GLUCOSE 332* --    Lab Results  Component Value Date   DDIMER 0.50* 06/27/2012   Radiology/Studies  Ct Angio Chest W/cm &/or Wo Cm  06/27/2012  *RADIOLOGY REPORT*  Clinical Data: Chest pain and elevated D-dimer.  CT ANGIOGRAPHY CHEST   IMPRESSION:  1.  Limited examination as  discussed above.  No obvious large central pulmonary emboli. 2.  Possible pulmonary AVM or venous varix in the superior segment of the right lower lobe. 3.  No definite acute pulmonary findings.  Original Report Authenticated By: P. Loralie Champagne, M.D.   Dg Chest Port 1 View  06/27/2012  *RADIOLOGY REPORT*  Clinical Data: Left-sided chest pain.  PORTABLE CHEST - 1 VIEW  IMPRESSION: Bibasilar atelectasis with low lung volumes.  Original Report Authenticated By: Reola Calkins, M.D.   ECG  Sinus tachycardia, HR low 100's, poor R wave progression, no acute ST segment changes.  Tele: sinus tachycardia, HR low 100's  ASSESSMENT AND PLAN  36 y/o female with PMH T2DM, HTN, OSA, GERD, seen in consultation for chest pain.   1. Chest pain: patient has no previous workup for CAD, echo in 2004 revealed EF 55-65%, but she does have + risk factors including T2DM, HTN, and morbid obesity. Chest pain presented atypically last night, occuring at rest and persisting for 1.5 hours. Chest pain was responsive to SL NTG, and CE are negative x 1 while ECG shows sinus tachycardia with no acute ST changes. D-dimer slightly elevated to 0.50 but CT angio negative for PE. Will cycle cardiac enzymes and ECG.  Given her size, imaging may be problematic.  That said, we will obtain a dobutamine echo - pt received BB this AM, thus will schedule for tomorrow.  Her weight exceeds the treadmill limit.  Obtain another set of CE now.  Hold bb this AM.  Continue ASA, heparin, , Lisinopril.   2. T2DM: poorly controlled with BS in mid-300's. Continue basal and SSI with diabetic diet and diabetes education. Per IM.  3. HTN: controlled. Continue Coreg and Lisinopril.  4.  Morbid obesity:  This will need to be aggressively addressed as an outpt.    Signed, Nicolasa Ducking, NP 06/27/2012, 9:27 AM  Patient seen with NP, agree with note.  Atypical prolonged pain with negative enzymes.  She is young but has risk factors: DM, HTN.   She is morbidly obese.  Objective stress testing would be difficult but would aim to avoid cath given quite atypical symptoms.  Will attempt DSE.   Marca Ancona 06/27/2012 9:47 AM

## 2012-06-27 NOTE — ED Notes (Signed)
Admitting MD at bedside.

## 2012-06-27 NOTE — H&P (Signed)
[facility] Attending Admission Note: Sara Neal MD [phone] pager office [phone] I  have seen and examined this patient, reviewed their chart. I have discussed this patient with the resident. I agree with the resident's findings, assessment and care plan. 

## 2012-06-27 NOTE — ED Notes (Signed)
CT called and notified that patient is ready for CT Angio scan.

## 2012-06-27 NOTE — H&P (Signed)
Family Medicine Teaching [facility] Admission History and Physical  Patient name: [name] Medical record number: [id number] Date of birth: [birth year] Age: 36 y.o. Gender: female  Primary Care Provider: MATTHEWS,CODY, DO  Chief Complaint: chest pain History of Present Illness: [name] is a 36 y.o. year old female with IDDM, HTN, GERD, HLD presenting with chest pain.  Patient reports being in her usual state of health until the evening prior to admission when she developed left sided, sharp chest pain that radiated into her left arm while eating a late dinner.  Associated with palpitations.  No nausea, vomiting, diaphoresis, dyspnea, dizziness.  No relief with rest.  Not worse with deep breath.  After calling [facility] emergency line, she called EMS.  Her chest pain resolved after SL nitro x2.  The chest pain gradually returned, becoming more sharp and was again relieved by SL nitro.  She also reports having fainting spells for the last several years, most recently 2-3 weeks ago.  No fevers, cough, dyspnea, urinary symptoms, abdominal pain.  ED Course: Chest pain relieved by SL nitro.  D-dimer mildly elevated and patient mildly tachycardic, CTA done with no evidence for PE.  EKG unchanged from previous.   Patient Active Problem List  Diagnosis  . Type 2 diabetes mellitus, uncontrolled  . Morbid obesity  . OBESITY HYPOVENTILATION SYNDROME  . DEPRESSION  . OBSTRUCTIVE SLEEP APNEA  . HYPERTENSION  . ASTHMA  . GERD  . Right carpal tunnel syndrome  . Bilateral knee pain  . Strain of lumbar paraspinal muscle  . Epigastric pain   Past Medical History: Past Medical History  Diagnosis Date  . Morbid obesity   . Extreme obesity with alveolar hypoventilation   . Morbid obesity   . Alveolar hypoventilation   . Diabetes mellitus   . Asthma   . GERD (gastroesophageal reflux disease)   . Rectal fissure   . Hypertension   . Costochondritis   . Cellulitis 08/2010-08/2011  .  Depression   . Bipolar 2 disorder     Past Surgical History: Past Surgical History  Procedure Date  . Carpal tunnel release   . Bil knee surgery     Social History: History   Social History  . Marital Status: Divorced    Spouse Name: N/A    Number of Children: N/A  . Years of Education: N/A   Social History Main Topics  . Smoking status: Former Smoker    Quit date: 12/07/2003  . Smokeless tobacco: Never Used  . Alcohol Use: No  . Drug Use: No  . Sexually Active: Yes    Birth Control/ Protection: None   Other Topics Concern  . Not on file   Social History Narrative  . No narrative on file    Family History: No family history on file.  Allergies: Allergies  Allergen Reactions  . Kiwi Extract Anaphylaxis  . Nubain (Nalbuphine Hcl) Other (See Comments)    Nervous...makes her feel like something is crawling on her.    Current Facility-Administered Medications  Medication Dose Route Frequency Provider Last Rate Last Dose  . iohexol (OMNIPAQUE) 350 MG/ML injection 100 mL  100 mL Intravenous Once PRN Medication Radiologist, MD   100 mL at 06/27/12 0310  . nitroGLYCERIN (NITROSTAT) 0.4 MG SL tablet           . DISCONTD: aspirin tablet 325 mg  325 mg Oral STAT Hurman Horn, MD      . DISCONTD: nitroGLYCERIN (NITROSTAT) SL tablet  0.4 mg  0.4 mg Sublingual Q5 min PRN Hurman Horn, MD       Current Outpatient Prescriptions  Medication Sig Dispense Refill  . albuterol (PROVENTIL HFA;VENTOLIN HFA) 108 (90 BASE) MCG/ACT inhaler Inhale 2 puffs into the lungs every 4 (four) hours as needed. For wheezing, shortness of breath      . budesonide-formoterol (SYMBICORT) 160-4.5 MCG/ACT inhaler Inhale 2 puffs into the lungs 2 (two) times daily.      . carvedilol (COREG) 6.25 MG tablet Take 6.25 mg by mouth 2 (two) times daily with a meal.       . cyclobenzaprine (FLEXERIL) 10 MG tablet Take 10 mg by mouth 3 (three) times daily as needed. For pain      . glimepiride (AMARYL) 4 MG  tablet Take 4 mg by mouth daily before breakfast.        . insulin aspart (NOVOLOG) 100 UNIT/ML injection Inject 45 Units into the skin 3 (three) times daily before meals.      . insulin glargine (LANTUS) 100 UNIT/ML injection Inject 95 Units into the skin 2 (two) times daily.      Marland Kitchen lisinopril (PRINIVIL,ZESTRIL) 10 MG tablet Take 10 mg by mouth daily.        Marland Kitchen nystatin (MYCOSTATIN) powder Apply 1 Units topically daily. Applies under stomach      . pantoprazole (PROTONIX) 40 MG tablet Take 40 mg by mouth daily.        . traMADol (ULTRAM) 50 MG tablet Take 50 mg by mouth every 6 (six) hours as needed. pain      . zolpidem (AMBIEN) 5 MG tablet Take 5 mg by mouth at bedtime as needed. For insomnia       Review Of Systems: Per HPI with the following additions: none Otherwise 12 point review of systems was performed and was unremarkable.  Physical Exam: Pulse: 98  Blood Pressure: 134/76 RR: 15   O2: 99 on RA Temp: 98.1  General: alert, cooperative, appears stated age, no distress and morbidly obese HEENT: PERRLA, extra ocular movement intact, sclera clear, anicteric, oropharynx clear, no lesions, neck supple with midline trachea and MMM Heart: S1, S2 normal, no murmur, rub or gallop, regular rate and rhythm Lungs: clear to auscultation, no wheezes or rales and unlabored breathing Abdomen: abdomen is soft without significant tenderness, masses, organomegaly or guarding Extremities: extremities normal, atraumatic, no cyanosis or edema and edema trace bilateral in ankles Skin:no rashes, no ecchymoses Neurology: normal without focal findings, mental status, speech normal, alert and oriented x3, PERLA, cranial nerves 2-12 intact and muscle tone and strength normal and symmetric  Labs and Imaging:  Lab 06/26/12 2330 06/21/12 2140  WBC 11.1* 8.4  HGB 13.8 15.6*  HCT 40.8 44.4  PLT 275 297    Lab 06/26/12 2330 06/21/12 2140  NA 135 136  K 3.9 4.4  CL 97 95*  CO2 27 28  BUN 9 9  CREATININE  0.40* 0.39*  CALCIUM 8.8 9.5  PROT -- 8.1  BILITOT -- 0.4  ALKPHOS -- 114  ALT -- 25  AST -- 19  GLUCOSE 332* 350*    Lab 06/27/12 0024  INR 0.99   POC troponin: negative D-dimer: 0.50  EKG: tachycardic, sinus rhythm, no ST changes  CXR: Bibasilar atelectasis with low lung volumes.  CTA:  1. Limited examination as discussed above. No obvious large  central pulmonary emboli.  2. Possible pulmonary AVM or venous varix in the superior segment  of the right  lower lobe.  3. No definite acute pulmonary findings.  Assessment and Plan: [name] is a 36 y.o. year old female with IDDM, HTN, HLD, GERD presenting with chest pain.  # Chest pain: DDx includes cardiac, GERD, MSK.  D-dimer was mildly elevated and CTA was negative for PE.  Does have risk factors for heart disease with morbid obesity, uncontrolled diabetes and LDL of 191.  EKG with no ST changes and POC troponin negative.  Relieved by SL nitro. - will admit to telemetry - cycle cardiac enzymes - check risk stratification labs - EKG in am - SL nitro prn for pain - morphine 2mg  IV q3hr prn for pain - scheduled ibuprofen for possible costochondritis component - continue home protonix for possible GERD component - consult cardiology in am  # IDDM: A1c >14 in 03/2012.   - will cut back to 50 units Lantus BID given NPO - resistant sliding scale q4hr - check A1c  # Hypertension: Relatively well controlled in ED. - will continue home meds and monitor  # Hyperlipidemia: Direct LDL of 191 in 03/2012. - will check fasting lipid panel  - will likely need to start a statin  # GERD: Possibly contributing to chest pain as above. - continue home protonix  # H/o asthma: Stable. - continue home meds  # Morbid obesity: Patient reports about 15lb weight loss over last several months.  FEN/GI: NPO: 1/2NS at 100cc/hr Prophylaxis: SQ lovenox Disposition: admit to tele  BOOTH, Glendon Dunwoody 06/27/2012, 4:11 AM

## 2012-06-27 NOTE — Progress Notes (Signed)
Reviewed discharge instructions with patient, she stated her understanding.  Patient discharged howe with mother and following up with cardiology as outpatient.  Colman Cater

## 2012-06-27 NOTE — ED Provider Notes (Addendum)
History     CSN: [id number]  Arrival date & time 06/26/12  2307   First MD Initiated Contact with Patient 06/26/12 2310      Chief Complaint  Patient presents with  . Chest Pain    (Consider location/radiation/quality/duration/timing/severity/associated sxs/prior treatment) Patient is a 36 y.o. female presenting with chest pain. The history is provided by the patient.  Chest Pain The chest pain began yesterday. Duration of episode(s) is 1 minute. Chest pain occurs intermittently. The chest pain is improving. The pain is associated with breathing. At its most intense, the pain is at 8/10. The pain is currently at 0/10. The severity of the pain is moderate. The quality of the pain is described as aching and sharp. The pain does not radiate. Chest pain is worsened by deep breathing.  Pertinent negatives for associated symptoms include no claudication, no diaphoresis, no lower extremity edema, no near-syncope, no numbness, no orthopnea, no paroxysmal nocturnal dyspnea and no weakness. She tried nitroglycerin and NSAIDs for the symptoms. Risk factors include sedentary lifestyle.  Her past medical history is significant for diabetes.     Past Medical History  Diagnosis Date  . Morbid obesity   . Extreme obesity with alveolar hypoventilation   . Morbid obesity   . Alveolar hypoventilation   . Diabetes mellitus   . Asthma   . GERD (gastroesophageal reflux disease)   . Rectal fissure   . Hypertension   . Costochondritis   . Cellulitis 08/2010-08/2011  . Depression   . Bipolar 2 disorder     Past Surgical History  Procedure Date  . Carpal tunnel release   . Bil knee surgery     No family history on file.  History  Substance Use Topics  . Smoking status: Former Smoker    Quit date: 12/07/2003  . Smokeless tobacco: Never Used  . Alcohol Use: No    OB History    Grav Para Term Preterm Abortions TAB SAB Ect Mult Living                  Review of Systems  Constitutional:  Negative for diaphoresis.  Cardiovascular: Positive for chest pain. Negative for orthopnea, claudication and near-syncope.  Neurological: Negative for weakness and numbness.  All other systems reviewed and are negative.    Allergies  Kiwi extract and Nubain  Home Medications   Current Outpatient Rx  Name Route Sig Dispense Refill  . ALBUTEROL SULFATE HFA 108 (90 BASE) MCG/ACT IN AERS Inhalation Inhale 2 puffs into the lungs every 4 (four) hours as needed. For wheezing, shortness of breath    . BUDESONIDE-FORMOTEROL FUMARATE 160-4.5 MCG/ACT IN AERO Inhalation Inhale 2 puffs into the lungs 2 (two) times daily.    Marland Kitchen CARVEDILOL 6.25 MG PO TABS Oral Take 6.25 mg by mouth 2 (two) times daily with a meal.     . CYCLOBENZAPRINE HCL 10 MG PO TABS Oral Take 10 mg by mouth 3 (three) times daily as needed. For pain    . GLIMEPIRIDE 4 MG PO TABS Oral Take 4 mg by mouth daily before breakfast.      . INSULIN ASPART 100 UNIT/ML [place] SOLN Subcutaneous Inject 45 Units into the skin 3 (three) times daily before meals.    . INSULIN GLARGINE 100 UNIT/ML [place] SOLN Subcutaneous Inject 95 Units into the skin 2 (two) times daily.    Marland Kitchen LISINOPRIL 10 MG PO TABS Oral Take 10 mg by mouth daily.      Morton Stall  100000 UNIT/GM EX POWD Topical Apply 1 Units topically daily. Applies under stomach    . PANTOPRAZOLE SODIUM 40 MG PO TBEC Oral Take 40 mg by mouth daily.      . TRAMADOL HCL 50 MG PO TABS Oral Take 50 mg by mouth every 6 (six) hours as needed. pain    . ZOLPIDEM TARTRATE 5 MG PO TABS Oral Take 5 mg by mouth at bedtime as needed. For insomnia      BP 111/57  Pulse 108  Temp 98.1 F (36.7 C) (Oral)  Resp 14  SpO2 95%  LMP 05/31/2012  Physical Exam  Constitutional: She is oriented to person, place, and time. She appears well-developed and well-nourished.       obese  HENT:  Head: Normocephalic and atraumatic.  Eyes: Conjunctivae and EOM are normal. Pupils are equal, round, and reactive to light.  Neck:  Normal range of motion.  Cardiovascular: Regular rhythm and normal heart sounds.  Tachycardia present.   Pulmonary/Chest: Effort normal and breath sounds normal.  Abdominal: Soft. Bowel sounds are normal.  Musculoskeletal: Normal range of motion.  Neurological: She is alert and oriented to person, place, and time.  Skin: Skin is warm and dry.  Psychiatric: She has a normal mood and affect. Her behavior is normal.    ED Course  Procedures (including critical care time)   Labs Reviewed  POCT I-STAT TROPONIN I  CBC  BASIC METABOLIC PANEL  D-DIMER, QUANTITATIVE  APTT  PROTIME-INR   No results found.   No diagnosis found.  Date: 06/27/2012  Rate: 109  Rhythm: sinus tach  QRS Axis: normal  Intervals: normal  ST/T Wave abnormalities: nonspecific changes  Conduction Disutrbances: none  Narrative Interpretation: unremarkable     MDM  + cp.  ce neg.  Await d-dimer.  No recent stress/cath. Will reassess   Discussed with family practice.  Will admit for ro and further workup of chest pain     Detrice Cales Lytle Michaels, MD 06/27/12 1478  Luay Balding Lytle Michaels, MD 06/27/12 2956

## 2012-06-27 NOTE — Progress Notes (Signed)
ANTICOAGULATION CONSULT NOTE - Initial Consult  Pharmacy Consult for heparin Indication: chest pain/ACS  Allergies  Allergen Reactions  . Kiwi Extract Anaphylaxis  . Nubain (Nalbuphine Hcl) Other (See Comments)    Nervous...makes her feel like something is crawling on her.    Patient Measurements: Height: 5\' 2"  (157.5 cm) Weight: 371 lb 7.6 oz (168.5 kg) IBW/kg (Calculated) : 50.1  Heparin Dosing Weight: 110kg  Vital Signs: Temp: 98.8 F (37.1 C) (07/23 0600) Temp src: Oral (07/22 2319) BP: 139/84 mmHg (07/23 0600) Pulse Rate: 99  (07/23 0600)  Labs:  Basename 06/27/12 0024 06/26/12 2330  HGB -- 13.8  HCT -- 40.8  PLT -- 275  APTT 31 --  LABPROT 13.3 --  INR 0.99 --  HEPARINUNFRC -- --  CREATININE -- 0.40*  CKTOTAL -- --  CKMB -- --  TROPONINI -- --    Estimated Creatinine Clearance: 152.5 ml/min (by C-G formula based on Cr of 0.4).   Medical History: Past Medical History  Diagnosis Date  . Morbid obesity   . Extreme obesity with alveolar hypoventilation   . Morbid obesity   . Alveolar hypoventilation   . Diabetes mellitus   . Asthma   . GERD (gastroesophageal reflux disease)   . Rectal fissure   . Hypertension   . Costochondritis   . Cellulitis 08/2010-08/2011  . Depression   . Bipolar 2 disorder     Medications:  Prescriptions prior to admission  Medication Sig Dispense Refill  . albuterol (PROVENTIL HFA;VENTOLIN HFA) 108 (90 BASE) MCG/ACT inhaler Inhale 2 puffs into the lungs every 4 (four) hours as needed. For wheezing, shortness of breath      . budesonide-formoterol (SYMBICORT) 160-4.5 MCG/ACT inhaler Inhale 2 puffs into the lungs 2 (two) times daily.      . carvedilol (COREG) 6.25 MG tablet Take 6.25 mg by mouth 2 (two) times daily with a meal.       . cyclobenzaprine (FLEXERIL) 10 MG tablet Take 10 mg by mouth 3 (three) times daily as needed. For pain      . glimepiride (AMARYL) 4 MG tablet Take 4 mg by mouth daily before breakfast.        .  insulin aspart (NOVOLOG) 100 UNIT/ML injection Inject 45 Units into the skin 3 (three) times daily before meals.      . insulin glargine (LANTUS) 100 UNIT/ML injection Inject 95 Units into the skin 2 (two) times daily.      Marland Kitchen lisinopril (PRINIVIL,ZESTRIL) 10 MG tablet Take 10 mg by mouth daily.        Marland Kitchen nystatin (MYCOSTATIN) powder Apply 1 Units topically daily. Applies under stomach      . pantoprazole (PROTONIX) 40 MG tablet Take 40 mg by mouth daily.        . traMADol (ULTRAM) 50 MG tablet Take 50 mg by mouth every 6 (six) hours as needed. pain      . zolpidem (AMBIEN) 5 MG tablet Take 5 mg by mouth at bedtime as needed. For insomnia       Scheduled:    . aspirin EC  81 mg Oral Daily  . budesonide-formoterol  2 puff Inhalation BID  . carvedilol  6.25 mg Oral BID WC  . insulin aspart  0-20 Units Subcutaneous Q4H  . insulin glargine  50 Units Subcutaneous BID  . lisinopril  10 mg Oral Daily  . nitroGLYCERIN      . nystatin   Topical Daily  . pantoprazole  40 mg  Oral Q1200  . sodium chloride  3 mL Intravenous Q12H  . DISCONTD: aspirin  325 mg Oral STAT  . DISCONTD: enoxaparin (LOVENOX) injection  80 mg Subcutaneous Q24H  . DISCONTD: ibuprofen  600 mg Oral TID    Assessment: 36yo female c/o sudden sharp CP radiating from L chest to L arm, relieved w/ SL NTG; D-dimer mildly elevated though CT negative for PE, initial CE wnl, to begin heparin empirically given risk factors.  Goal of Therapy:  Heparin level 0.3-0.7 units/ml Monitor platelets by anticoagulation protocol: Yes   Plan:  Will give heparin bolus of 4000 units IV x1 followed by gtt at 1500 units/hr and monitor heparin levels and CBC.  Colleen Can PharmD BCPS 06/27/2012,7:08 AM

## 2012-06-27 NOTE — ED Notes (Signed)
Patient refused IV in her AC at first; informed patient that we need [facility] or higher IV in order to perform CT Angio scan.  Patient agreed to have [facility] IV as long as it was removed right after scan is finished.

## 2012-06-28 NOTE — Discharge Summary (Signed)
Family Medicine Teaching Service  Discharge Note : Attending Denny Levy MD Pager [phone] Office [phone] I have seen and examined this patient, reviewed their chart and discussed discharge planning wit the resident at the time of discharge. I agree with the discharge plan as above. Patient was seen by cardiology and was scheduled for a DSE. On my rounds around 1 pm she mentioned she wanted to go home without having the test performed. We discussed risk benefit with her and she was adamant taht she wanted to go home. Discussed with cardiology and she will be set up by [facility] cards as an outpatient for further work up.

## 2012-06-28 NOTE — Progress Notes (Signed)
Utilization review completed.  

## 2012-06-29 ENCOUNTER — Encounter: Payer: Self-pay | Admitting: Family Medicine

## 2012-06-29 ENCOUNTER — Ambulatory Visit (INDEPENDENT_AMBULATORY_CARE_PROVIDER_SITE_OTHER): Payer: Medicaid Other | Admitting: Family Medicine

## 2012-06-29 VITALS — BP 144/87 | HR 82 | Ht 62.0 in | Wt 372.0 lb

## 2012-06-29 DIAGNOSIS — IMO0001 Reserved for inherently not codable concepts without codable children: Secondary | ICD-10-CM

## 2012-06-29 DIAGNOSIS — M25561 Pain in right knee: Secondary | ICD-10-CM

## 2012-06-29 DIAGNOSIS — M25569 Pain in unspecified knee: Secondary | ICD-10-CM

## 2012-06-29 DIAGNOSIS — E1165 Type 2 diabetes mellitus with hyperglycemia: Secondary | ICD-10-CM

## 2012-06-29 DIAGNOSIS — IMO0002 Reserved for concepts with insufficient information to code with codable children: Secondary | ICD-10-CM

## 2012-06-29 DIAGNOSIS — R079 Chest pain, unspecified: Secondary | ICD-10-CM

## 2012-06-29 MED ORDER — HYDROCODONE-ACETAMINOPHEN 10-325 MG PO TABS: 1.0000 | ORAL_TABLET | Freq: Two times a day (BID) | ORAL | Status: DC | PRN

## 2012-06-29 NOTE — Patient Instructions (Signed)
Thank you for coming in today, it was good to see you I would like for you increase your lantus to 105 units in the morning and 105 units at night Keep your mealtime insulin where it is, keep a log of your sugars I would like for you to continue to increase the time you are walking every day.  Try to increase by 5 minutes each day Come in one morning before you eat to have your lipid panel drawn, do this sometime in the next week. Follow up with me in two weeks, bring your log with you.

## 2012-06-29 NOTE — Progress Notes (Signed)
  Subjective:    Patient ID: [name], female    DOB: 1976-08-10, 36 y.o.   MRN: [id number]  HPI  1. HFU:  Hospitalized recently for chest pain given increased risk factors.  Work up negative.  No further chest pain since discharge from hospital.  Is planned to follow up with cardiology on 8/13.    2.  Obesity:  She is trying to walk some but feels limited by pain and shortness of breath.  Currently able to walk ~15 minutes per day.  States Dr. Denyse Amass was giving her Rx for hydrocodone to help with knee pain so that she could exercise.  Has failed CS injection before.  Planned to have injection under [place] at [facility].  She states that she has made some changes to diet.  She has tried to reduce her number of carbs and "white" foods.  24 hour food recall with  Breakfast:  2 pop tarts Lunch: Did not eat Dinner:  Airline pilot, Brown rice, [employer] Breakfast: Eggs, Grits, Sausage  3.  Diabetes:  Has been checking fasting glucose in the morning, typically getting around mid 200's.  She is not checking post prandial glucose.  She has been compliant with her medications.  Currently using Lantus 95 units BID and Novolog 45 units before each meal.  She has not been compliant with her diet.  She has been trying to walk some.  She denies increased thirst or urination.     Review of Systems Per HPI    Objective:   Physical Exam  Constitutional:       Morbidly obese female, NAD   Cardiovascular: Normal rate and regular rhythm.  Exam reveals no gallop and no friction rub.   No murmur heard. Pulmonary/Chest: Effort normal and breath sounds normal.  Musculoskeletal: She exhibits edema (Trace).  Neurological: She is alert.          Assessment & Plan:

## 2012-06-30 ENCOUNTER — Other Ambulatory Visit: Payer: Self-pay | Admitting: Family Medicine

## 2012-06-30 NOTE — Telephone Encounter (Signed)
Patient is calling because the Rx for the new dose of Lantus and the extra test strips have not been sent in to her pharmacy yet.

## 2012-07-02 MED ORDER — INSULIN GLARGINE 100 UNIT/ML ~~LOC~~ SOLN: 105.0000 [IU] | Freq: Two times a day (BID) | SUBCUTANEOUS | Status: DC

## 2012-07-02 NOTE — Assessment & Plan Note (Signed)
>>  ASSESSMENT AND PLAN FOR INSULIN DEPENDENT TYPE 2 DIABETES MELLITUS ([name]) WRITTEN ON 07/02/2012 10:25 PM BY MATTHEWS, CODY  Fasting blood glucose still significantly elevated.  Hgb A1c >14.  Advised to increase lantus to 105 units and check post prandial glucose as well.  Bring log to next appt. F/u in two weeks.

## 2012-07-02 NOTE — Assessment & Plan Note (Signed)
Resolved for now.  Multiple risk factors for CAD.  Plans to f/u with cards in mid august.

## 2012-07-02 NOTE — Assessment & Plan Note (Signed)
24 hour food recall reveals that she is not compliant with healthy diet.  Advised to be sure she is getting 3 small meals per day.  I refilled her hydrocodone given the stipulation that she uses to increase her activity.

## 2012-07-02 NOTE — Assessment & Plan Note (Signed)
Fasting blood glucose still significantly elevated.  Hgb A1c >14.  Advised to increase lantus to 105 units and check post prandial glucose as well.  Bring log to next appt. F/u in two weeks.

## 2012-07-03 ENCOUNTER — Other Ambulatory Visit: Payer: Self-pay | Admitting: Family Medicine

## 2012-07-03 MED ORDER — NITROGLYCERIN 0.4 MG SL SUBL: 0.4000 mg | SUBLINGUAL_TABLET | SUBLINGUAL | Status: DC | PRN

## 2012-07-03 MED ORDER — ONDANSETRON HCL 4 MG PO TABS: 4.0000 mg | ORAL_TABLET | Freq: Four times a day (QID) | ORAL | Status: AC | PRN

## 2012-07-04 ENCOUNTER — Telehealth: Payer: Self-pay | Admitting: Family Medicine

## 2012-07-04 DIAGNOSIS — IMO0002 Reserved for concepts with insufficient information to code with codable children: Secondary | ICD-10-CM

## 2012-07-04 DIAGNOSIS — E1165 Type 2 diabetes mellitus with hyperglycemia: Secondary | ICD-10-CM

## 2012-07-04 DIAGNOSIS — E119 Type 2 diabetes mellitus without complications: Secondary | ICD-10-CM

## 2012-07-04 MED ORDER — GLUCOSE BLOOD VI STRP: ORAL_STRIP | Status: DC

## 2012-07-04 NOTE — Telephone Encounter (Signed)
Looks like Dr. Ashley Royalty attempted sending in 07/28 but " no print" was selected.  I called pharmacy and called in Rx for Lantus . Pharmacist states patient has been getting Lantus Solostar. , not the vials. Advised to give insulin pen  Also called in strips for accu chek Aviva. Patient notified.   will ask Dr. Ashley Royalty to enter Lantus Solostar on med list.

## 2012-07-04 NOTE — Telephone Encounter (Signed)
Pt was supposed to increase insulin and Dr Ashley Royalty was supposed to send in to [facility] - they state that they have not rec'd it yet. Also needs her strips

## 2012-07-05 ENCOUNTER — Other Ambulatory Visit: Payer: Self-pay | Admitting: Family Medicine

## 2012-07-05 MED ORDER — INSULIN GLARGINE 100 UNIT/ML ~~LOC~~ SOLN: 105.0000 [IU] | Freq: Two times a day (BID) | SUBCUTANEOUS | Status: DC

## 2012-07-05 NOTE — Telephone Encounter (Signed)
Done

## 2012-07-06 ENCOUNTER — Ambulatory Visit: Payer: Medicaid Other | Admitting: Family Medicine

## 2012-07-06 IMAGING — CR DG CHEST 2V
2 series · 2 of 2 positions shown · non-contrast
Comparison: Chest x-ray and CTA of the chest dated 10/17/2010

CLINICAL DATA: Chest pain.

CHEST - 2 VIEW

[w chest pa]
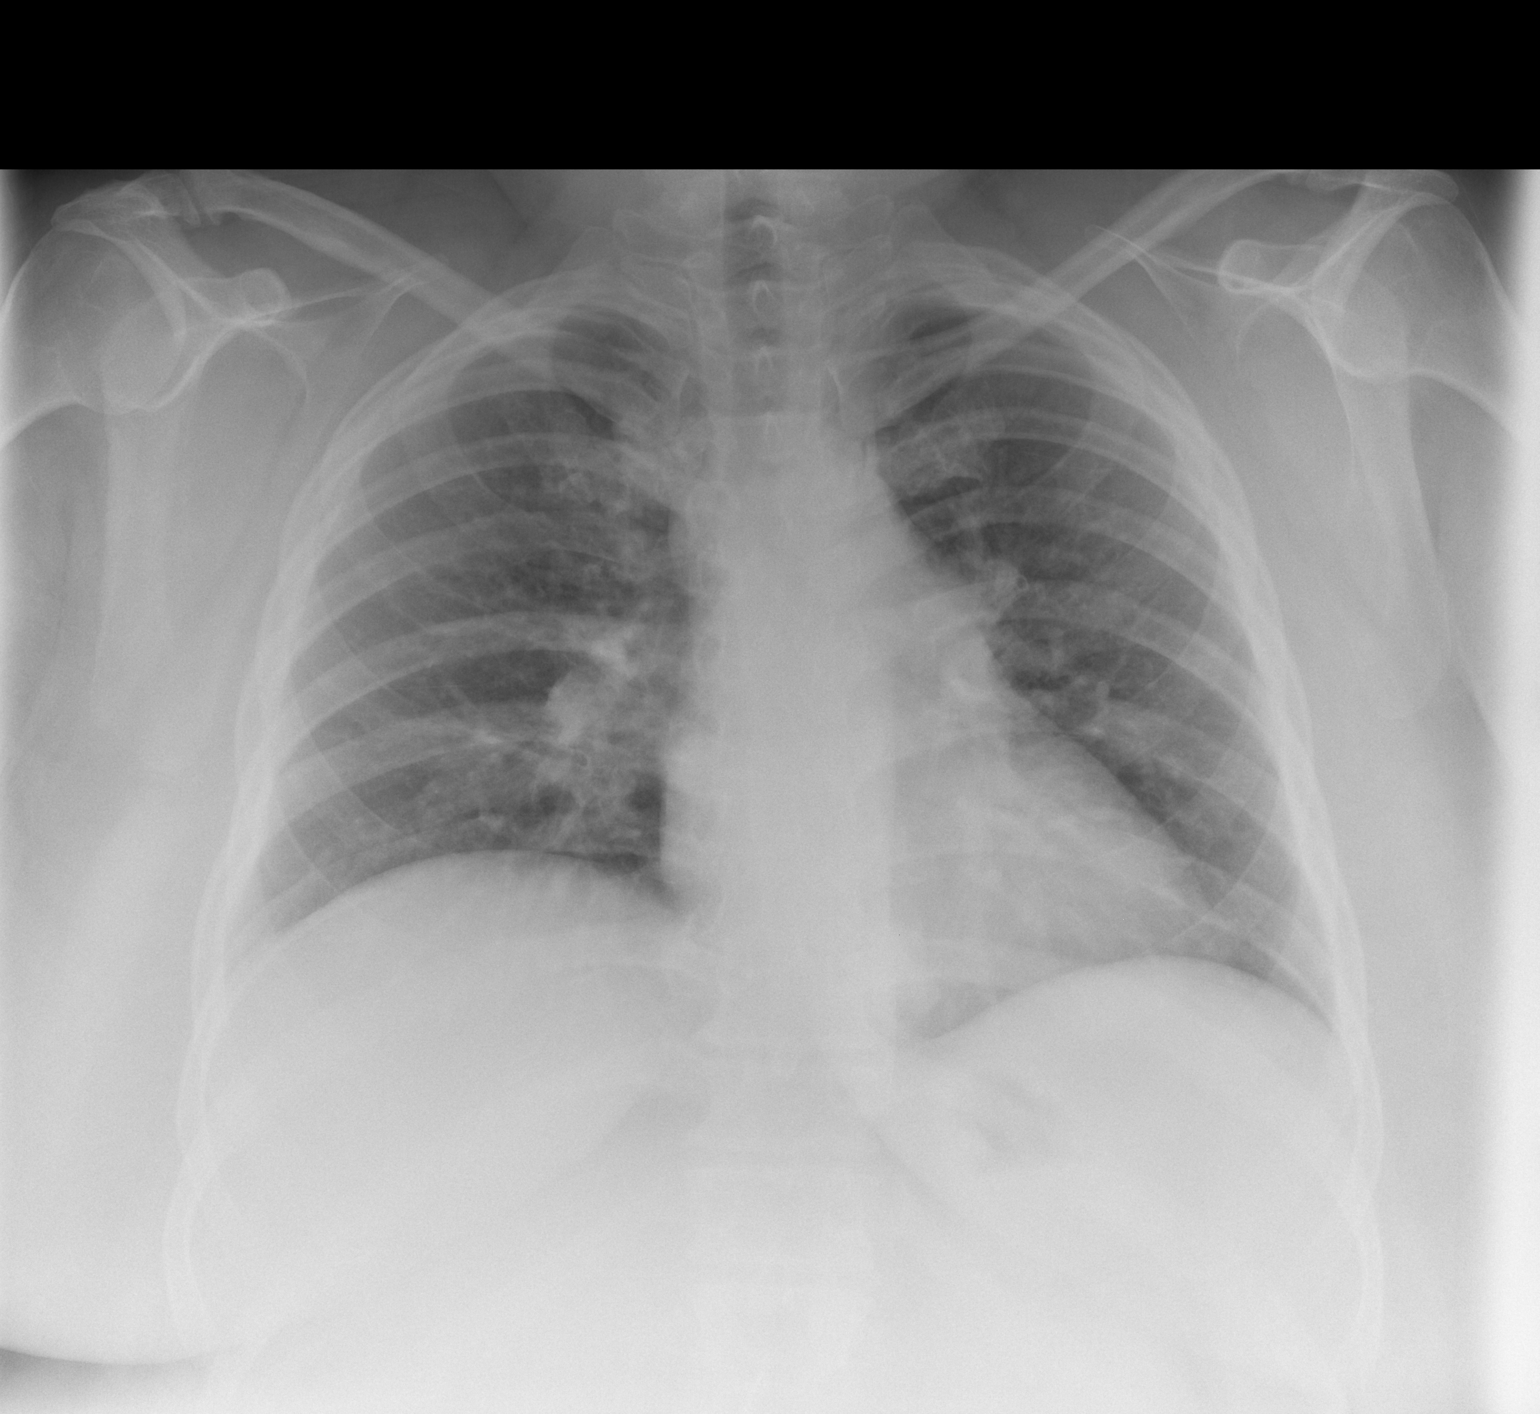

[w chest lat *]
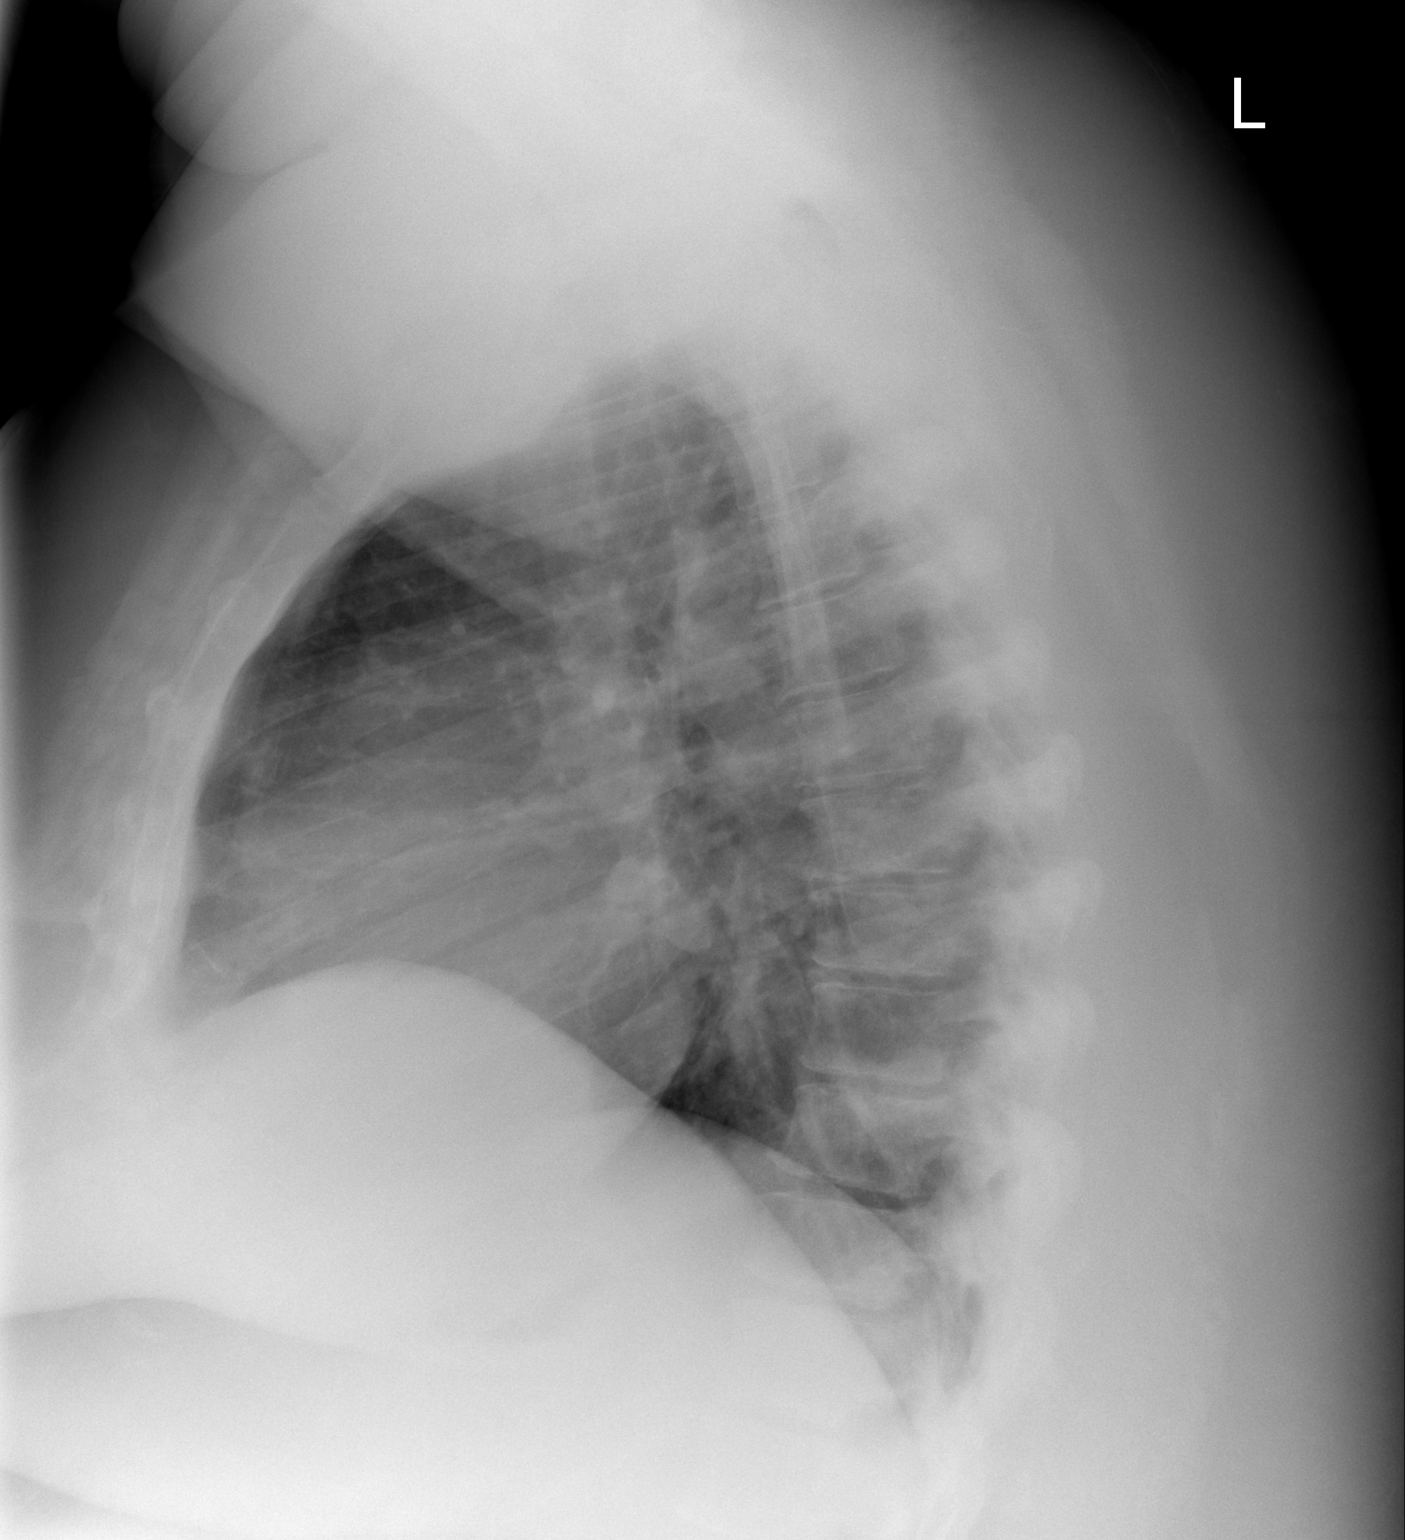

[2 of 2 positions shown; findings below may reference images not displayed]

FINDINGS: The heart size and vascularity are normal and the lungs
are clear.  No significant osseous abnormality.
IMPRESSION: Normal chest.

## 2012-07-18 ENCOUNTER — Ambulatory Visit (INDEPENDENT_AMBULATORY_CARE_PROVIDER_SITE_OTHER): Payer: Medicaid Other | Admitting: Cardiovascular Disease

## 2012-07-18 ENCOUNTER — Encounter: Payer: Self-pay | Admitting: Cardiovascular Disease

## 2012-07-18 VITALS — BP 112/68 | HR 103 | Ht 62.0 in | Wt 381.0 lb

## 2012-07-18 DIAGNOSIS — G4733 Obstructive sleep apnea (adult) (pediatric): Secondary | ICD-10-CM

## 2012-07-18 DIAGNOSIS — I10 Essential (primary) hypertension: Secondary | ICD-10-CM

## 2012-07-18 DIAGNOSIS — R072 Precordial pain: Secondary | ICD-10-CM

## 2012-07-18 DIAGNOSIS — R079 Chest pain, unspecified: Secondary | ICD-10-CM

## 2012-07-18 NOTE — Assessment & Plan Note (Signed)
Tolerating CPAP and she reports compliance with this.

## 2012-07-18 NOTE — Assessment & Plan Note (Signed)
Blood pressure is controlled on carvedilol and lisinopril.

## 2012-07-18 NOTE — Progress Notes (Signed)
HPI:  36 year old woman presenting for followup evaluation. 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 She was recently seen in consultation on 06/27/2012 after presenting with chest pain. A dobutamine stress echocardiogram was recommended but it was not completed in the hospital and the patient opted for outpatient followup.  The patient has a background of morbid obesity with BMI greater than 60, long-standing type 2 diabetes, hypertension, and gastroesophageal reflux disease.  She complains of left-sided chest pains. She's had 2 recent episodes requiring nitroglycerin. Her episodes have been prolonged and there is partial relief with nitroglycerin. She's had long-standing epigastric and central chest pain that she is related to gastroesophageal reflux disease. She's had no associated symptoms with her chest pain episodes and she specifically denies associated dyspnea, diaphoresis, nausea, or vomiting. She's had no lightheadedness, palpitations, or syncope.  Outpatient Encounter Prescriptions as of 07/18/2012  Medication Sig Dispense Refill  . albuterol (PROVENTIL HFA;VENTOLIN HFA) 108 (90 BASE) MCG/ACT inhaler Inhale 2 puffs into the lungs every 4 (four) hours as needed. For wheezing, shortness of breath      . aspirin EC 81 MG EC tablet Take 1 tablet (81 mg total) by mouth daily.      . budesonide-formoterol (SYMBICORT) 160-4.5 MCG/ACT inhaler Inhale 2 puffs into the lungs 2 (two) times daily.      . carvedilol (COREG) 6.25 MG tablet Take 6.25 mg by mouth 2 (two) times daily with a meal.       . cyclobenzaprine (FLEXERIL) 10 MG tablet TAKE 1 TABLET BY MOUTH THREE TIMES A DAY AS NEEDED FOR MUSCLE SPASMS  30 tablet  3  . glimepiride (AMARYL) 4 MG tablet Take 4 mg by mouth daily before breakfast.        . glucose blood (ACCU-CHEK AVIVA) test strip Check blood sugars  before and after meals and at bedtime.  200 each  2  . insulin aspart (NOVOLOG) 100 UNIT/ML injection  Inject 45 Units into the skin 3 (three) times daily before meals.      . insulin glargine (LANTUS SOLOSTAR) 100 UNIT/ML injection Inject 105 Units into the skin 2 (two) times daily.  5 pen  15  . lisinopril (PRINIVIL,ZESTRIL) 10 MG tablet Take 10 mg by mouth daily.        . nitroGLYCERIN (NITROSTAT) 0.4 MG SL tablet Place 1 tablet (0.4 mg total) under the tongue every 5 (five) minutes x 3 doses as needed for chest pain.  25 tablet  0  . nystatin (MYCOSTATIN) powder Apply 1 Units topically daily. Applies under stomach      . pantoprazole (PROTONIX) 40 MG tablet Take 40 mg by mouth daily.        . traMADol (ULTRAM) 50 MG tablet Take 50 mg by mouth every 6 (six) hours as needed. pain      . VOLTAREN 1 % GEL APPLY 1 APPLICATION TO AFFECTED AREA TOPICALLY FOUR TIMES A DAY  100 g  2  . zolpidem (AMBIEN) 5 MG tablet Take 5 mg by mouth at bedtime as needed. For insomnia      . DISCONTD: cyclobenzaprine (FLEXERIL) 10 MG tablet Take 10 mg by mouth 3 (three) times daily as needed. For pain        Allergies  Allergen Reactions  . Kiwi Extract Anaphylaxis  . Nubain (Nalbuphine Hcl) Other (See Comments)    Nervous...makes her feel like something is crawling on her.    Past Medical History  Diagnosis Date  .  Morbid obesity   . Extreme obesity with alveolar hypoventilation   . Alveolar hypoventilation   . Asthma   . GERD (gastroesophageal reflux disease)   . Rectal fissure   . Hypertension   . Costochondritis   . Cellulitis 08/2010-08/2011  . Depression   . Bipolar 2 disorder   . Obstructive sleep apnea   . Chest pain   . Shortness of breath   . Diabetes mellitus     a. Dx ~ 2000 per pt;  b. BS 300 is her "normal"  . Arthritis     ROS: Negative except as per HPI  BP 112/68  Pulse 103  Ht 5\' 2"  (1.575 m)  Wt 172.82 kg (381 lb)  BMI 69.69 kg/m2  PHYSICAL EXAM: Pt is alert and oriented, pleasant, morbidly obese woman in NAD HEENT: normal Neck: JVP - normal, carotids 2+= without  bruits Lungs: CTA bilaterally CV: RRR without murmur or gallop Abd: soft, NT, Positive BS, obese Ext: 1+ bilateral pretibial edema, distal pulses intact and equal Skin: warm/dry no rash  EKG:  Sinus tachycardia 103 beats per minute, poor R-wave progression cannot rule out anterior infarct.  ASSESSMENT AND PLAN:

## 2012-07-18 NOTE — Assessment & Plan Note (Signed)
Long discussion with the patient about the implications of her weight on her overall health and the adverse impact that it is playing. She understands.

## 2012-07-18 NOTE — Patient Instructions (Addendum)
Your physician has requested that you have a dobutamine echocardiogram. For further information please visit [email or web address] Please follow instruction sheet as given.  Your physician recommends that you schedule a follow-up appointment as needed.

## 2012-07-18 NOTE — Assessment & Plan Note (Signed)
Difficult situation because of limitations with stress imaging studies in this patient with morbid obesity and body mass index of almost 70. Her chest pain syndrome has some typical and some atypical features. Her risk factors include diabetes and hypertension, but there is an absence of family history and she has a minimal smoking history. I the overall likelihood of obstructive CAD in this patient at age 36 is fairly low. I have recommended a dobutamine stress echocardiogram for further risk stratification. I'm sure that an echo contrast agent will be required and even with that the study quality will probably be fairly limited. Hopefully we can obtain enough information to stratify her as low risk. If not, cardiac catheterization may need to be considered. I will followup with her after the results of her stress echocardiogram are completed.

## 2012-07-19 ENCOUNTER — Ambulatory Visit: Payer: Medicaid Other | Admitting: Family Medicine

## 2012-07-20 ENCOUNTER — Other Ambulatory Visit (HOSPITAL_COMMUNITY): Payer: Medicaid Other

## 2012-07-25 ENCOUNTER — Telehealth: Payer: Self-pay | Admitting: Family Medicine

## 2012-07-25 ENCOUNTER — Encounter (HOSPITAL_COMMUNITY): Payer: Self-pay | Admitting: *Deleted

## 2012-07-25 ENCOUNTER — Emergency Department (HOSPITAL_COMMUNITY): Payer: Medicaid Other

## 2012-07-25 ENCOUNTER — Emergency Department (HOSPITAL_COMMUNITY)
Admission: EM | Admit: 2012-07-25 | Discharge: 2012-07-26 | Disposition: A | Discharge status: Home / Self Care | Payer: Medicaid Other | Attending: Emergency Medicine | Admitting: Emergency Medicine

## 2012-07-25 DIAGNOSIS — Z794 Long term (current) use of insulin: Secondary | ICD-10-CM | POA: Insufficient documentation

## 2012-07-25 DIAGNOSIS — R000 Tachycardia, unspecified: Secondary | ICD-10-CM | POA: Insufficient documentation

## 2012-07-25 DIAGNOSIS — R079 Chest pain, unspecified: Secondary | ICD-10-CM

## 2012-07-25 DIAGNOSIS — I10 Essential (primary) hypertension: Secondary | ICD-10-CM | POA: Insufficient documentation

## 2012-07-25 DIAGNOSIS — J45909 Unspecified asthma, uncomplicated: Secondary | ICD-10-CM | POA: Insufficient documentation

## 2012-07-25 DIAGNOSIS — Z79899 Other long term (current) drug therapy: Secondary | ICD-10-CM | POA: Insufficient documentation

## 2012-07-25 DIAGNOSIS — R0602 Shortness of breath: Secondary | ICD-10-CM | POA: Insufficient documentation

## 2012-07-25 DIAGNOSIS — E119 Type 2 diabetes mellitus without complications: Secondary | ICD-10-CM | POA: Insufficient documentation

## 2012-07-25 DIAGNOSIS — R609 Edema, unspecified: Secondary | ICD-10-CM | POA: Insufficient documentation

## 2012-07-25 LAB — POCT I-STAT TROPONIN I: Troponin i, poc: 0 ng/mL (ref 0.00–0.08)

## 2012-07-25 LAB — CBC
Platelets: 311 10*3/uL (ref 150–400)
RBC: 4.37 MIL/uL (ref 3.87–5.11)
WBC: 11.3 10*3/uL — ABNORMAL HIGH (ref 4.0–10.5)

## 2012-07-25 MED ORDER — MORPHINE SULFATE 4 MG/ML IJ SOLN
4.0000 mg | Freq: Once | INTRAMUSCULAR | Status: AC
  Administered 2012-07-26: 4 mg via INTRAVENOUS
  Filled 2012-07-25: qty 1

## 2012-07-25 NOTE — ED Notes (Signed)
Pt c/o CP since 9:40 pm.  States pain is in left chest along with SOB.  Denies n/v, weakness.  Has taken aspirin and SL NTG x 2 at home with no relief.

## 2012-07-25 NOTE — Telephone Encounter (Signed)
EMERGENCY LINE CALL: Have chest pain, has taken 3 nitro tabs and 2 ASA tablets and is still having the pain.  Rx'ed nitro from ED end of July.  Has cardiologist at [facility]; most recently seen 5 days ago.  Chest pain is sharp in left side of chest.  Nothing has made better or worse.  No other symptoms-- no SOB, diaphoresis or dizziness.  Pt does have DM, HTN, obesity.  Pt scheduled for dobutamine stress test next week with cards.  She does not feel like this is indigestion and has not tried anything other than aspirin and nitroglycerin.  While this does not sound like MI/CAD, advised pt to be seen in the ED tonight if she feels like her pain is significant or if she started experiencing any other symptoms.  Otherwise, advised pt to f/u in [facility] clinic in the AM if she is not evaluated tonight for at least an EKG.  Pt stated understanding.

## 2012-07-26 ENCOUNTER — Encounter (HOSPITAL_COMMUNITY): Payer: Self-pay | Admitting: *Deleted

## 2012-07-26 DIAGNOSIS — IMO0001 Reserved for inherently not codable concepts without codable children: Secondary | ICD-10-CM | POA: Insufficient documentation

## 2012-07-26 DIAGNOSIS — R0789 Other chest pain: Principal | ICD-10-CM | POA: Insufficient documentation

## 2012-07-26 DIAGNOSIS — E785 Hyperlipidemia, unspecified: Secondary | ICD-10-CM | POA: Insufficient documentation

## 2012-07-26 DIAGNOSIS — G4733 Obstructive sleep apnea (adult) (pediatric): Secondary | ICD-10-CM | POA: Insufficient documentation

## 2012-07-26 DIAGNOSIS — I10 Essential (primary) hypertension: Secondary | ICD-10-CM | POA: Insufficient documentation

## 2012-07-26 LAB — BASIC METABOLIC PANEL
CO2: 33 mEq/L — ABNORMAL HIGH (ref 19–32)
Calcium: 9.2 mg/dL (ref 8.4–10.5)
Calcium: 9.9 mg/dL (ref 8.4–10.5)
Creatinine, Ser: 0.43 mg/dL — ABNORMAL LOW (ref 0.50–1.10)
Creatinine, Ser: 0.5 mg/dL (ref 0.50–1.10)
GFR calc non Af Amer: >90 mL/min (ref >90)
GFR calc non Af Amer: >90 mL/min (ref >90)
Glucose, Bld: 204 mg/dL — ABNORMAL HIGH (ref 70–99)
Sodium: 137 mEq/L (ref 135–145)
Sodium: 141 mEq/L (ref 135–145)

## 2012-07-26 LAB — CBC WITH DIFFERENTIAL/PLATELET
Eosinophils Absolute: 0.3 10*3/uL (ref 0.0–0.7)
Eosinophils Relative: 3 % (ref 0–5)
HCT: 42 % (ref 36.0–46.0)
Lymphocytes Relative: 34 % (ref 12–46)
Lymphs Abs: 3.5 10*3/uL (ref 0.7–4.0)
MCH: 29.8 pg (ref 26.0–34.0)
MCV: 90.1 fL (ref 78.0–100.0)
Monocytes Absolute: 0.7 10*3/uL (ref 0.1–1.0)
Platelets: 314 10*3/uL (ref 150–400)
RBC: 4.66 MIL/uL (ref 3.87–5.11)
RDW: 12.4 % (ref 11.5–15.5)

## 2012-07-26 LAB — PRO B NATRIURETIC PEPTIDE: Pro B Natriuretic peptide (BNP): 7.2 pg/mL (ref 0–125)

## 2012-07-26 LAB — POCT I-STAT TROPONIN I

## 2012-07-26 MED ORDER — IBUPROFEN 600 MG PO TABS: 600.0000 mg | ORAL_TABLET | Freq: Four times a day (QID) | ORAL | Status: AC | PRN

## 2012-07-26 MED ORDER — IBUPROFEN 200 MG PO TABS
600.0000 mg | ORAL_TABLET | Freq: Once | ORAL | Status: AC
  Administered 2012-07-26: 600 mg via ORAL
  Filled 2012-07-26: qty 3

## 2012-07-26 MED ORDER — ONDANSETRON HCL 4 MG/2ML IJ SOLN
4.0000 mg | Freq: Once | INTRAMUSCULAR | Status: AC
  Administered 2012-07-26: 4 mg via INTRAVENOUS
  Filled 2012-07-26: qty 2

## 2012-07-26 NOTE — ED Provider Notes (Signed)
History     CSN: [id number]  Arrival date & time 07/25/12  2309   First MD Initiated Contact with Patient 07/25/12 2334      Chief Complaint  Patient presents with  . Chest Pain    (Consider location/radiation/quality/duration/timing/severity/associated sxs/prior treatment) HPI Comments: 36 y/o female presents with sudden onset chest pain around 9:30 tonight while sitting watching tv. Pain is non radiating and located on left side of chest, described as sharp 8/10. She had chest pain like this a few months ago relieved by nitroglycerin and aspirin, but this time she took these with no relief. Admits to associated SOB on exertion. Denies lightheadedness, dizziness, diaphoresis, nausea, vomiting.  Patient is a 36 y.o. female presenting with chest pain. The history is provided by the patient.  Chest Pain Primary symptoms include shortness of breath. Pertinent negatives for primary symptoms include no nausea, no vomiting and no dizziness.  Pertinent negatives for associated symptoms include no diaphoresis.     Past Medical History  Diagnosis Date  . Morbid obesity   . Extreme obesity with alveolar hypoventilation   . Alveolar hypoventilation   . Asthma   . GERD (gastroesophageal reflux disease)   . Rectal fissure   . Hypertension   . Costochondritis   . Cellulitis 08/2010-08/2011  . Depression   . Bipolar 2 disorder   . Obstructive sleep apnea   . Chest pain   . Shortness of breath   . Diabetes mellitus     a. Dx ~ 2000 per pt;  b. BS 300 is her "normal"  . Arthritis     Past Surgical History  Procedure Date  . Carpal tunnel release   . Bil knee surgery     Family History  Problem Relation Age of Onset  . Diabetes Mother   . Hyperlipidemia Mother   . Depression Mother   . Heart attack Paternal Uncle   . Heart disease Paternal Grandmother   . Heart attack Paternal Grandmother   . Heart attack Paternal Grandfather   . Heart disease Paternal Grandfather      History  Substance Use Topics  . Smoking status: Former Smoker -- 0.3 packs/day for .3 years    Types: Cigarettes    Quit date: 12/06/1993  . Smokeless tobacco: Never Used  . Alcohol Use: No    OB History    Grav Para Term Preterm Abortions TAB SAB Ect Mult Living                  Review of Systems  Constitutional: Negative for diaphoresis.  Respiratory: Positive for shortness of breath.   Cardiovascular: Positive for chest pain.  Gastrointestinal: Negative for nausea and vomiting.  Neurological: Negative for dizziness and light-headedness.    Allergies  Kiwi extract and Nubain  Home Medications   Current Outpatient Rx  Name Route Sig Dispense Refill  . ALBUTEROL SULFATE HFA 108 (90 BASE) MCG/ACT IN AERS Inhalation Inhale 2 puffs into the lungs every 4 (four) hours as needed. For wheezing, shortness of breath    . ASPIRIN 81 MG PO TBEC Oral Take 1 tablet (81 mg total) by mouth daily.    . BUDESONIDE-FORMOTEROL FUMARATE 160-4.5 MCG/ACT IN AERO Inhalation Inhale 2 puffs into the lungs 2 (two) times daily.    Marland Kitchen CARVEDILOL 6.25 MG PO TABS Oral Take 6.25 mg by mouth 2 (two) times daily with a meal.     . CYCLOBENZAPRINE HCL 10 MG PO TABS  TAKE 1 TABLET BY MOUTH  THREE TIMES A DAY AS NEEDED FOR MUSCLE SPASMS 30 tablet 3  . GLIMEPIRIDE 4 MG PO TABS Oral Take 4 mg by mouth daily before breakfast.      . INSULIN ASPART 100 UNIT/ML [place] SOLN Subcutaneous Inject 45 Units into the skin 3 (three) times daily before meals.    . INSULIN GLARGINE 100 UNIT/ML [place] SOLN Subcutaneous Inject 105 Units into the skin 2 (two) times daily. 5 pen 15  . LISINOPRIL 10 MG PO TABS Oral Take 10 mg by mouth daily.      Marland Kitchen NITROGLYCERIN 0.4 MG SL SUBL Sublingual Place 1 tablet (0.4 mg total) under the tongue every 5 (five) minutes x 3 doses as needed for chest pain. 25 tablet 0  . NYSTATIN 100000 UNIT/GM EX POWD Topical Apply 1 Units topically daily. Applies under stomach    . PANTOPRAZOLE SODIUM 40 MG PO  TBEC Oral Take 40 mg by mouth daily.      . TRAMADOL HCL 50 MG PO TABS Oral Take 50 mg by mouth every 6 (six) hours as needed. pain    . VOLTAREN 1 % TD GEL  APPLY 1 APPLICATION TO AFFECTED AREA TOPICALLY FOUR TIMES A DAY 100 g 2  . ZOLPIDEM TARTRATE 5 MG PO TABS Oral Take 5 mg by mouth at bedtime as needed. For insomnia    . GLUCOSE BLOOD VI STRP  Check blood sugars  before and after meals and at bedtime. 200 each 2    BP 127/74  Pulse 109  Temp 98 F (36.7 C) (Oral)  Resp 20  SpO2 97%  Physical Exam  Nursing note and vitals reviewed. Constitutional: She is oriented to person, place, and time.       Morbidly obese  HENT:  Head: Normocephalic and atraumatic.  Mouth/Throat: Oropharynx is clear and moist and mucous membranes are normal.  Eyes: Conjunctivae and EOM are normal. Pupils are equal, round, and reactive to light.  Neck: Neck supple. No JVD present.  Cardiovascular: Regular rhythm and intact distal pulses.  Tachycardia present.  Exam reveals distant heart sounds.        +1 pitting edema lower extremities bilaterally  Pulmonary/Chest: Effort normal and breath sounds normal. Not tachypneic. She has no wheezes. She has no rales.  Abdominal: Bowel sounds are normal. There is no tenderness.  Neurological: She is alert and oriented to person, place, and time.  Skin: Skin is warm and dry. She is not diaphoretic.  Psychiatric: She has a normal mood and affect. Her speech is normal and behavior is normal.    ED Course  Procedures (including critical care time)  Labs Reviewed  CBC - Abnormal; Notable for the following:    WBC 11.3 (*)     All other components within normal limits  POCT I-STAT TROPONIN I  BASIC METABOLIC PANEL  PRO B NATRIURETIC PEPTIDE   Date: 07/26/2012  Rate: 108  Rhythm: sinus tachycardia  QRS Axis: right  Intervals: normal  ST/T Wave abnormalities: normal  Conduction Disutrbances:none  Narrative Interpretation: no stemi  Old EKG Reviewed:  unchanged   Dg Chest 2 View  07/25/2012  *RADIOLOGY REPORT*  Clinical Data: Left chest pain  CHEST - 2 VIEW  Comparison: 06/27/2012  Findings: Lungs are clear without infiltrate or effusion.  Negative for heart failure or mass.  IMPRESSION: No active cardiopulmonary disease.   Original Report Authenticated By: Camelia Phenes, M.D.      No diagnosis found.    MDM  36 y/o  female with sudden onset chest pain not relieved by nitro. Has dobutamine stress test set up next week according to family practice who is in ED consulting another patient. Troponin negative. Patient in NAD. Awaiting d-dimer and BNP. Case discussed with Dr. Rhunette Croft who will take over care of patient at this time.        Trevor Mace, PA-C 07/26/12 [phone]

## 2012-07-26 NOTE — ED Notes (Addendum)
Patient c/o chest pain in left chest radiating down left arm, patient seen here for same last night and has stress test scheduled for tomorrow, patient states she took 2 nitro tablets pta with minimal relief

## 2012-07-27 ENCOUNTER — Encounter (HOSPITAL_COMMUNITY): Payer: Self-pay | Admitting: Internal Medicine

## 2012-07-27 ENCOUNTER — Observation Stay (HOSPITAL_COMMUNITY): Payer: Medicaid Other

## 2012-07-27 ENCOUNTER — Other Ambulatory Visit (HOSPITAL_COMMUNITY): Payer: Medicaid Other

## 2012-07-27 ENCOUNTER — Encounter (HOSPITAL_COMMUNITY)
Admission: EM | Disposition: A | Discharge status: Home / Self Care | Payer: Self-pay | Source: Home / Self Care | Attending: Emergency Medicine

## 2012-07-27 ENCOUNTER — Observation Stay (HOSPITAL_COMMUNITY)
Admission: EM | Admit: 2012-07-27 | Discharge: 2012-07-27 | Disposition: A | Discharge status: Home / Self Care | Payer: Medicaid Other | Attending: Cardiovascular Disease | Admitting: Cardiovascular Disease

## 2012-07-27 DIAGNOSIS — R079 Chest pain, unspecified: Secondary | ICD-10-CM

## 2012-07-27 HISTORY — DX: Hyperlipidemia, unspecified: E78.5

## 2012-07-27 HISTORY — PX: LEFT HEART CATHETERIZATION WITH CORONARY ANGIOGRAM: SHX5451

## 2012-07-27 LAB — CARDIAC PANEL(CRET KIN+CKTOT+MB+TROPI)
CK, MB: 1.2 ng/mL (ref 0.3–4.0)
Relative Index: INVALID (ref 0.0–2.5)
Total CK: 55 U/L (ref 7–177)

## 2012-07-27 LAB — HEMOGLOBIN A1C: Mean Plasma Glucose: 315 mg/dL — ABNORMAL HIGH (ref <117)

## 2012-07-27 LAB — PREGNANCY, URINE: Preg Test, Ur: NEGATIVE

## 2012-07-27 LAB — PROTIME-INR: Prothrombin Time: 13.8 seconds (ref 11.6–15.2)

## 2012-07-27 SURGERY — LEFT HEART CATHETERIZATION WITH CORONARY ANGIOGRAM
Anesthesia: LOCAL

## 2012-07-27 MED ORDER — ONDANSETRON HCL 4 MG/2ML IJ SOLN
4.0000 mg | Freq: Four times a day (QID) | INTRAMUSCULAR | Status: DC | PRN
  Administered 2012-07-27: 4 mg via INTRAVENOUS
  Filled 2012-07-27: qty 2

## 2012-07-27 MED ORDER — SODIUM CHLORIDE 0.9 % IV SOLN: 250.0000 mL | INTRAVENOUS | Status: DC | PRN

## 2012-07-27 MED ORDER — ASPIRIN 300 MG RE SUPP: 300.0000 mg | RECTAL | Status: AC

## 2012-07-27 MED ORDER — SODIUM CHLORIDE 0.9 % IJ SOLN: 3.0000 mL | Freq: Two times a day (BID) | INTRAMUSCULAR | Status: DC

## 2012-07-27 MED ORDER — ACETAMINOPHEN 325 MG PO TABS: 650.0000 mg | ORAL_TABLET | ORAL | Status: DC | PRN

## 2012-07-27 MED ORDER — MORPHINE SULFATE 4 MG/ML IJ SOLN
4.0000 mg | Freq: Once | INTRAMUSCULAR | Status: AC
  Administered 2012-07-27: 4 mg via INTRAVENOUS
  Filled 2012-07-27: qty 1

## 2012-07-27 MED ORDER — TRAMADOL HCL 50 MG PO TABS
50.0000 mg | ORAL_TABLET | Freq: Four times a day (QID) | ORAL | Status: DC | PRN
  Administered 2012-07-27: 50 mg via ORAL
  Filled 2012-07-27 (×2): qty 1

## 2012-07-27 MED ORDER — ASPIRIN EC 81 MG PO TBEC: 81.0000 mg | DELAYED_RELEASE_TABLET | Freq: Every day | ORAL | Status: DC

## 2012-07-27 MED ORDER — ONDANSETRON HCL 4 MG/2ML IJ SOLN: 4.0000 mg | Freq: Four times a day (QID) | INTRAMUSCULAR | Status: DC | PRN

## 2012-07-27 MED ORDER — INSULIN ASPART 100 UNIT/ML ~~LOC~~ SOLN
20.0000 [IU] | Freq: Once | SUBCUTANEOUS | Status: AC
  Administered 2012-07-27: 20 [IU] via SUBCUTANEOUS

## 2012-07-27 MED ORDER — INSULIN ASPART 100 UNIT/ML ~~LOC~~ SOLN: 6.0000 [IU] | Freq: Three times a day (TID) | SUBCUTANEOUS | Status: DC

## 2012-07-27 MED ORDER — LISINOPRIL 10 MG PO TABS
10.0000 mg | ORAL_TABLET | Freq: Every day | ORAL | Status: DC
  Filled 2012-07-27: qty 1

## 2012-07-27 MED ORDER — ENOXAPARIN SODIUM 40 MG/0.4ML ~~LOC~~ SOLN
40.0000 mg | SUBCUTANEOUS | Status: DC
  Filled 2012-07-27: qty 0.4

## 2012-07-27 MED ORDER — NITROGLYCERIN 0.2 MG/ML ON CALL CATH LAB
INTRAVENOUS | Status: AC
  Filled 2012-07-27: qty 1

## 2012-07-27 MED ORDER — ALBUTEROL SULFATE HFA 108 (90 BASE) MCG/ACT IN AERS: 2.0000 | INHALATION_SPRAY | RESPIRATORY_TRACT | Status: DC | PRN

## 2012-07-27 MED ORDER — INSULIN ASPART 100 UNIT/ML ~~LOC~~ SOLN: 0.0000 [IU] | Freq: Three times a day (TID) | SUBCUTANEOUS | Status: DC

## 2012-07-27 MED ORDER — HEPARIN (PORCINE) IN NACL 2-0.9 UNIT/ML-% IJ SOLN
INTRAMUSCULAR | Status: AC
  Filled 2012-07-27: qty 2000

## 2012-07-27 MED ORDER — CARVEDILOL 3.125 MG PO TABS
6.2500 mg | ORAL_TABLET | Freq: Two times a day (BID) | ORAL | Status: DC
  Filled 2012-07-27 (×3): qty 1

## 2012-07-27 MED ORDER — PANTOPRAZOLE SODIUM 40 MG PO TBEC: 40.0000 mg | DELAYED_RELEASE_TABLET | Freq: Every day | ORAL | Status: DC

## 2012-07-27 MED ORDER — FENTANYL CITRATE 0.05 MG/ML IJ SOLN
INTRAMUSCULAR | Status: AC
  Filled 2012-07-27: qty 2

## 2012-07-27 MED ORDER — MIDAZOLAM HCL 2 MG/2ML IJ SOLN
INTRAMUSCULAR | Status: AC
  Filled 2012-07-27: qty 2

## 2012-07-27 MED ORDER — HEPARIN SODIUM (PORCINE) 1000 UNIT/ML IJ SOLN
INTRAMUSCULAR | Status: AC
  Filled 2012-07-27: qty 1

## 2012-07-27 MED ORDER — ASPIRIN 81 MG PO CHEW
324.0000 mg | CHEWABLE_TABLET | ORAL | Status: AC
  Administered 2012-07-27: 324 mg via ORAL
  Filled 2012-07-27: qty 4

## 2012-07-27 MED ORDER — ZOLPIDEM TARTRATE 5 MG PO TABS: 5.0000 mg | ORAL_TABLET | Freq: Every evening | ORAL | Status: DC | PRN

## 2012-07-27 MED ORDER — LIDOCAINE HCL (PF) 1 % IJ SOLN
INTRAMUSCULAR | Status: AC
  Filled 2012-07-27: qty 30

## 2012-07-27 MED ORDER — ASPIRIN 81 MG PO CHEW
324.0000 mg | CHEWABLE_TABLET | ORAL | Status: DC
  Filled 2012-07-27: qty 4

## 2012-07-27 MED ORDER — CYCLOBENZAPRINE HCL 10 MG PO TABS
10.0000 mg | ORAL_TABLET | Freq: Three times a day (TID) | ORAL | Status: DC | PRN
  Filled 2012-07-27: qty 1

## 2012-07-27 MED ORDER — VERAPAMIL HCL 2.5 MG/ML IV SOLN
INTRAVENOUS | Status: AC
  Filled 2012-07-27: qty 2

## 2012-07-27 MED ORDER — BUDESONIDE-FORMOTEROL FUMARATE 160-4.5 MCG/ACT IN AERO
2.0000 | INHALATION_SPRAY | Freq: Two times a day (BID) | RESPIRATORY_TRACT | Status: DC
  Filled 2012-07-27: qty 6

## 2012-07-27 MED ORDER — SODIUM CHLORIDE 0.9 % IV SOLN: INTRAVENOUS | Status: DC

## 2012-07-27 MED ORDER — GLIMEPIRIDE 4 MG PO TABS
4.0000 mg | ORAL_TABLET | Freq: Every day | ORAL | Status: DC
  Filled 2012-07-27 (×2): qty 1

## 2012-07-27 MED ORDER — NITROGLYCERIN 0.4 MG SL SUBL: 0.4000 mg | SUBLINGUAL_TABLET | SUBLINGUAL | Status: DC | PRN

## 2012-07-27 MED ORDER — INSULIN GLARGINE 100 UNIT/ML ~~LOC~~ SOLN
50.0000 [IU] | Freq: Once | SUBCUTANEOUS | Status: AC
  Administered 2012-07-27: 50 [IU] via SUBCUTANEOUS
  Filled 2012-07-27: qty 3

## 2012-07-27 MED ORDER — INSULIN ASPART 100 UNIT/ML ~~LOC~~ SOLN: 0.0000 [IU] | Freq: Every day | SUBCUTANEOUS | Status: DC

## 2012-07-27 MED ORDER — SODIUM CHLORIDE 0.9 % IJ SOLN: 3.0000 mL | INTRAMUSCULAR | Status: DC | PRN

## 2012-07-27 MED ORDER — INSULIN GLARGINE 100 UNIT/ML ~~LOC~~ SOLN: 70.0000 [IU] | Freq: Two times a day (BID) | SUBCUTANEOUS | Status: DC

## 2012-07-27 MED FILL — Insulin Glargine Inj 100 Unit/ML: SUBCUTANEOUS | Qty: 0.5 | Status: AC

## 2012-07-27 NOTE — ED Notes (Signed)
Pt. In xray 

## 2012-07-27 NOTE — ED Notes (Signed)
Pt. C/o chest pain radiating down left arm. States pain 8/10. Reports nausea and SOB at onset. Pt. Has apt for stress test at [facility] tomorrow. Hx of same. Pt took 2 nitro prior to arrival, states it only temporarily relieved pain.

## 2012-07-27 NOTE — CV Procedure (Signed)
   Cardiac Catheterization Procedure Note  Name: [name] MRN: [id number] DOB: 09/05/1976  Procedure: Left Heart Cath, Selective Coronary Angiography, LV angiography  Indication: Chest pain, morbidly obese 36 year old diabetic woman whose body habitus is not suitable for stress testing. She has had severe resting chest pain with multiple emergency room evaluation. We elected to proceed with definitive evaluation with cardiac catheterization to exclude obstructive CAD.   Procedural Details: The right wrist was prepped, draped, and anesthetized with 1% lidocaine. Using the modified Seldinger technique, a 5 French sheath was introduced into the right radial artery. 3 mg of verapamil was administered through the sheath, weight-based unfractionated heparin was administered intravenously. Standard Judkins catheters were used for selective coronary angiography and left ventriculography. Catheter exchanges were performed over an exchange length guidewire. There were no immediate procedural complications. A TR band was used for radial hemostasis at the completion of the procedure.  The patient was transferred to the post catheterization recovery area for further monitoring.  Procedural Findings: Hemodynamics: AO 145/103 with a mean of 122 LV 150/29  Coronary angiography: Coronary dominance: right  Left mainstem: Widely patent with no obstructive coronary disease.  Left anterior descending (LAD): Patent to the left ventricular apex. There is no significant stenosis identified. There are 2 diagonals with no significant stenosis.  Left circumflex (LCx): The left circumflex is patent throughout. There were no irregularities noted. The vessel appears widely patent. The first OM is large without significant disease. The left circumflex is dominant and gives off a PLA and a PDA branch, both are patent.  Right coronary artery (RCA): The right coronary artery is nondominant. It gives off a single RV  marginal branch without significant stenosis.  Left ventriculography: Left ventricular systolic function is normal, LVEF is estimated at 55-65%, there is no significant mitral regurgitation   Final Conclusions:   1. Widely patent coronary arteries 2. Normal LV systolic function 3. Elevated LVEDP  Recommendations: The patient likely has noncardiac chest pain. She does have elevated filling pressures and will require ongoing aggressive risk reduction with treatment of her hypertension, diabetes, and efforts at weight loss. She will be eligible for discharge later this morning.  Tonny Bollman 07/27/2012, 9:36 AM

## 2012-07-27 NOTE — ED Provider Notes (Signed)
Medical screening examination/treatment/procedure(s) were performed by non-physician practitioner and as supervising physician I was immediately available for consultation/collaboration.  Tyishia Aune, MD 07/27/12 0837 

## 2012-07-27 NOTE — Progress Notes (Signed)
Patient seen and evaluated. Dr. Silvana Newness note from early this morning was reviewed with no additions to make. The patient has had continued chest pain which has been severe. The patient is young, but she has multiple uncontrolled risk factors for premature coronary artery disease. Noninvasive testing would be extremely limited considering her body habitus. Plans for cardiac catheterization and possible PCI. The risks, indications, and alternatives have been reviewed with the patient in detail. She understands and agrees to proceed.  [name] 07/27/2012 9:02 AM

## 2012-07-27 NOTE — H&P (Signed)
Cardiology H&P  Primary Care Povider: MATTHEWS,CODY, DO Primary Cardiologist: Excell Seltzer   HPI: [name] is a 36 y.o.female with insulin dependent diabetes and morbid obesity who has been having severe chest pain at rest for months.  Tonight she came to the ED because of constant chest pain since yesterday.  She has been scheduled to undergo a stress test but has been canceled in the past due to poor image quality due to obesity.  She reports no SOB or nausea with these episodes but just a constant pressure like pain in her chest.  She does not have a history of any cardiac disease.  She does have significant DM requiring large amt of insulin as well as OSA.  She has had chest pain in the past that was attributed to GERD.  Her first set of POC trop is negative tonight.   Past Medical History  Diagnosis Date  . Morbid obesity   . Extreme obesity with alveolar hypoventilation   . Alveolar hypoventilation   . Asthma   . GERD (gastroesophageal reflux disease)   . Rectal fissure   . Hypertension   . Costochondritis   . Cellulitis 08/2010-08/2011  . Depression   . Bipolar 2 disorder   . Obstructive sleep apnea   . Chest pain   . Shortness of breath   . Diabetes mellitus     a. Dx ~ 2000 per pt;  b. BS 300 is her "normal"  . Arthritis     Past Surgical History  Procedure Date  . Carpal tunnel release   . Bil knee surgery     Family History  Problem Relation Age of Onset  . Diabetes Mother   . Hyperlipidemia Mother   . Depression Mother   . Heart attack Paternal Uncle   . Heart disease Paternal Grandmother   . Heart attack Paternal Grandmother   . Heart attack Paternal Grandfather   . Heart disease Paternal Grandfather     Social History:  reports that she quit smoking about 18 years ago. Her smoking use included Cigarettes. She has a .09 pack-year smoking history. She has never used smokeless tobacco. She reports that she does not drink alcohol or use illicit drugs.  Allergies:   Allergies  Allergen Reactions  . Kiwi Extract Anaphylaxis  . Nubain (Nalbuphine Hcl) Other (See Comments)    Nervous...makes her feel like something is crawling on her.    No current facility-administered medications for this encounter.   Current Outpatient Prescriptions  Medication Sig Dispense Refill  . albuterol (PROVENTIL HFA;VENTOLIN HFA) 108 (90 BASE) MCG/ACT inhaler Inhale 2 puffs into the lungs every 4 (four) hours as needed. For wheezing, shortness of breath      . aspirin EC 81 MG EC tablet Take 1 tablet (81 mg total) by mouth daily.      . budesonide-formoterol (SYMBICORT) 160-4.5 MCG/ACT inhaler Inhale 2 puffs into the lungs 2 (two) times daily.      . carvedilol (COREG) 6.25 MG tablet Take 6.25 mg by mouth 2 (two) times daily with a meal.       . cyclobenzaprine (FLEXERIL) 10 MG tablet TAKE 1 TABLET BY MOUTH THREE TIMES A DAY AS NEEDED FOR MUSCLE SPASMS  30 tablet  3  . glimepiride (AMARYL) 4 MG tablet Take 4 mg by mouth daily before breakfast.        . glucose blood (ACCU-CHEK AVIVA) test strip Check blood sugars  before and after meals and at bedtime.  200 each  2  . ibuprofen (ADVIL,MOTRIN) 600 MG tablet Take 1 tablet (600 mg total) by mouth every 6 (six) hours as needed for pain.  30 tablet  0  . insulin aspart (NOVOLOG) 100 UNIT/ML injection Inject 45 Units into the skin 3 (three) times daily before meals.      . insulin glargine (LANTUS SOLOSTAR) 100 UNIT/ML injection Inject 105 Units into the skin 2 (two) times daily.  5 pen  15  . lisinopril (PRINIVIL,ZESTRIL) 10 MG tablet Take 10 mg by mouth daily.        . nitroGLYCERIN (NITROSTAT) 0.4 MG SL tablet Place 1 tablet (0.4 mg total) under the tongue every 5 (five) minutes x 3 doses as needed for chest pain.  25 tablet  0  . nystatin (MYCOSTATIN) powder Apply 1 Units topically daily. Applies under stomach      . traMADol (ULTRAM) 50 MG tablet Take 50 mg by mouth every 6 (six) hours as needed. pain      . VOLTAREN 1 % GEL  APPLY 1 APPLICATION TO AFFECTED AREA TOPICALLY FOUR TIMES A DAY  100 g  2  . zolpidem (AMBIEN) 5 MG tablet Take 5 mg by mouth at bedtime as needed. For insomnia      . pantoprazole (PROTONIX) 40 MG tablet Take 40 mg by mouth daily.          ROS: A full review of systems is obtained and is negative except as noted in the HPI.  Physical Exam: Blood pressure 121/76, pulse 109, temperature 98.4 F (36.9 C), temperature source Oral, resp. rate 20, last menstrual period 06/24/2012, SpO2 96.00%.  GENERAL: no acute distress. Morbidly obese EYES: Extra ocular movements are intact. There is no lid lag. Sclera is anicteric.  ENT: Oropharynx is clear. Dentition is within normal limits.  NECK: Supple. The thyroid is not enlarged.  LYMPH: There are no obvious masses or lymphadenopathy HEART: Regular rate and rhythm with no m/g/r.  Normal S1/S2.  LUNGS: Clear to auscultation There are no rales, rhonchi, or wheezes.  ABDOMEN: Soft, non-tender, and non-distended EXTREMITIES: mild LE edema PULSES: radial pulses 2+, palmer arch intact on the right SKIN: Warm, dry, and intact.  NEUROLOGIC: The patient was oriented to person, place, and time. No overt neurologic deficits were detected.  PSYCH: Normal judgment and insight, mood is appropriate.   Results: Results for orders placed during the hospital encounter of 07/27/12 (from the past 24 hour(s))  CBC WITH DIFFERENTIAL     Status: Normal   Collection Time   07/26/12  8:45 PM      Component Value Range   WBC 10.1  4.0 - 10.5 K/uL   RBC 4.66  3.87 - 5.11 MIL/uL   Hemoglobin 13.9  12.0 - 15.0 g/dL   HCT 16.1  09.6 - 04.5 %   MCV 90.1  78.0 - 100.0 fL   MCH 29.8  26.0 - 34.0 pg   MCHC 33.1  30.0 - 36.0 g/dL   RDW 40.9  81.1 - 91.4 %   Platelets 314  150 - 400 K/uL   Neutrophils Relative 56  43 - 77 %   Neutro Abs 5.7  1.7 - 7.7 K/uL   Lymphocytes Relative 34  12 - 46 %   Lymphs Abs 3.5  0.7 - 4.0 K/uL   Monocytes Relative 7  3 - 12 %   Monocytes  Absolute 0.7  0.1 - 1.0 K/uL   Eosinophils Relative 3  0 - 5 %  Eosinophils Absolute 0.3  0.0 - 0.7 K/uL   Basophils Relative 0  0 - 1 %   Basophils Absolute 0.0  0.0 - 0.1 K/uL  BASIC METABOLIC PANEL     Status: Abnormal   Collection Time   07/26/12  8:45 PM      Component Value Range   Sodium 141  135 - 145 mEq/L   Potassium 4.5  3.5 - 5.1 mEq/L   Chloride 101  96 - 112 mEq/L   CO2 33 (*) 19 - 32 mEq/L   Glucose, Bld 204 (*) 70 - 99 mg/dL   BUN 7  6 - 23 mg/dL   Creatinine, Ser 8.65  0.50 - 1.10 mg/dL   Calcium 9.9  8.4 - 78.4 mg/dL   GFR calc non Af Amer >90  >90 mL/min   GFR calc Af Amer >90  >90 mL/min  POCT I-STAT TROPONIN I     Status: Normal   Collection Time   07/26/12  8:58 PM      Component Value Range   Troponin i, poc 0.00  0.00 - 0.08 ng/mL   Comment 3             EKG: NSR without STT changes CXR: pending  Assessment/Plan: 36 yo WF with morbid obesity and insulin dependent diabetes here with intermittent severe chest pain at rest.   1. Chest Pain: multiple risk factors for CAD although young.  Limited options for stress testing due to morbid obesity (BMI 70) - plan for LHC in AM to r/o obstructive CAD per Dr. Excell Seltzer - continue aspirin - continue PPI 2. Tachycardia: suspect due to obesity.  No other major symptoms concerning for PE - will check Ddimer 3. HTN: controlled - continue home Rx 4. DM: - check Ha1c - give 2/3 home dose lantus and TID aspart only with meals and sliding scale correction 5. DVT ppx: lovenox 40  [name] 07/27/2012, 1:39 AM

## 2012-07-27 NOTE — Discharge Summary (Signed)
Discharge Summary   Patient ID: [name] MRN: [id number], DOB/AGE: 12-Aug-1976 36 y.o.  Primary MD: MATTHEWS,CODY, DO Primary Cardiologist: Dr. Excell Seltzer Admit date: 07/27/2012 D/C date:     07/27/2012      Primary Discharge Diagnoses:  1. Chest pain, Noncardiac  - Cath revealed no CAD, nl LV systolic fxn, elevated LVEDP  - Risk factor modification  - Follow up with PCP  Secondary Discharge Diagnoses:  1. Hypertension   2. Hyperlipidemia - LDL 191 3. Diabetes mellitus, Type 2, Uncontrolled - A1C >14.0 4. Morbid obesity  5. Obstructive sleep apnea   6. Alveolar hypoventilation  7. Asthma   8. GERD   9. Rectal fissure   10. Costochondritis   11. Cellulitis 08/2010-08/2011   12. Depression   13. Bipolar 2 disorder   14. Arthritis  15. Carpal tunnel release  16. Bil knee surgery   Allergies Allergies  Allergen Reactions  . Kiwi Extract Anaphylaxis  . Nubain (Nalbuphine Hcl) Other (See Comments)    Nervous...makes her feel like something is crawling on her.    Diagnostic Studies/Procedures:   07/27/12 - Cardiac Cath Hemodynamics:  AO 145/103 with a mean of 122  LV 150/29  Coronary angiography:  Coronary dominance: right  Left mainstem: Widely patent with no obstructive coronary disease.  Left anterior descending (LAD): Patent to the left ventricular apex. There is no significant stenosis identified. There are 2 diagonals with no significant stenosis.  Left circumflex (LCx): The left circumflex is patent throughout. There were no irregularities noted. The vessel appears widely patent. The first OM is large without significant disease. The left circumflex is dominant and gives off a PLA and a PDA branch, both are patent.  Right coronary artery (RCA): The right coronary artery is nondominant. It gives off a single RV marginal branch without significant stenosis.  Left ventriculography: Left ventricular systolic function is normal, LVEF is estimated at 55-65%, there is no  significant mitral regurgitation  Final Conclusions:  1. Widely patent coronary arteries  2. Normal LV systolic function  3. Elevated LVEDP  Recommendations: The patient likely has noncardiac chest pain. She does have elevated filling pressures and will require ongoing aggressive risk reduction with treatment of her hypertension, diabetes, and efforts at weight loss. She will be eligible for discharge later this morning.   History of Present Illness: 36 y.o. female w/ the above medical problems who presented to [facility] on 07/26/12 with complaints of chest pain. She reported severe chest pain at rest for months and reported being scheduled for a stress test, but this had been cancelled in the past due to poor image quality due to obesity, so she presented to the ED.  Hospital Course: In the ED, EKG revealed NSR with no acute ST changes. CXR was without acute cardiopulmonary abnormalities. Labs were significant for normal poc troponin, Glucose 204, otherwise unremarkable BMET/CBC. She was admitted for further evaluation and treatment.   Cardiac enzymes remained negative. DDimer was normal. Given her risk factors and limitation of noninvasive testing due to obesity it was felt she would benefit from cardiac cath to rule out premature CAD. Cath revealed widely patent coronary arteries, normal LV systolic function and elevated LVEDP. She tolerated the procedure well without complications. Recommendations were made for aggressive risk factor modification of her HTN, DM, and efforts at weight loss. Cath site remained stable. She was seen and evaluated by Dr. Excell Seltzer who felt she was stable for discharge home with plans for follow  up as scheduled below.  Discharge Vitals: Blood pressure 116/65, pulse 107, temperature 98.4 F (36.9 C), temperature source Oral, resp. rate 20, height 5\' 2"  (1.575 m), weight 363 lb 8.6 oz (164.9 kg), last menstrual period 07/24/2012, SpO2 92.00%.  Labs:  Component  Value Date   WBC 10.1 07/26/2012   HGB 13.9 07/26/2012   HCT 42.0 07/26/2012   MCV 90.1 07/26/2012   PLT 314 07/26/2012    Lab 07/26/12 2045  NA 141  K 4.5  CL 101  CO2 33*  BUN 7  CREATININE 0.50  CALCIUM 9.9  GLUCOSE 204*   Basename 07/27/12 0542  CKTOTAL 55  CKMB 1.2  TROPONINI <0.30   Component Value Date   DDIMER 0.43 07/27/2012    07/26/2012 00:15  Pro B Natriuretic peptide (BNP) 7.2    Discharge Medications   Medication List  As of 07/27/2012 10:55 AM   STOP taking these medications         nitroGLYCERIN 0.4 MG SL tablet         TAKE these medications         albuterol 108 (90 BASE) MCG/ACT inhaler   Commonly known as: PROVENTIL HFA;VENTOLIN HFA   Inhale 2 puffs into the lungs every 4 (four) hours as needed. For wheezing, shortness of breath      aspirin 81 MG EC tablet   Take 1 tablet (81 mg total) by mouth daily.      budesonide-formoterol 160-4.5 MCG/ACT inhaler   Commonly known as: SYMBICORT   Inhale 2 puffs into the lungs 2 (two) times daily.      carvedilol 6.25 MG tablet   Commonly known as: COREG   Take 6.25 mg by mouth 2 (two) times daily with a meal.      cyclobenzaprine 10 MG tablet   Commonly known as: FLEXERIL   TAKE 1 TABLET BY MOUTH THREE TIMES A DAY AS NEEDED FOR MUSCLE SPASMS      glimepiride 4 MG tablet   Commonly known as: AMARYL   Take 4 mg by mouth daily before breakfast.      glucose blood test strip   Check blood sugars  before and after meals and at bedtime.      ibuprofen 600 MG tablet   Commonly known as: ADVIL,MOTRIN   Take 1 tablet (600 mg total) by mouth every 6 (six) hours as needed for pain.      insulin aspart 100 UNIT/ML injection   Commonly known as: novoLOG   Inject 45 Units into the skin 3 (three) times daily before meals.      insulin glargine 100 UNIT/ML injection   Commonly known as: LANTUS   Inject 105 Units into the skin 2 (two) times daily.      lisinopril 10 MG tablet   Commonly known as:  PRINIVIL,ZESTRIL   Take 10 mg by mouth daily.      nystatin powder   Commonly known as: MYCOSTATIN   Apply 1 Units topically daily. Applies under stomach      pantoprazole 40 MG tablet   Commonly known as: PROTONIX   Take 40 mg by mouth daily.      traMADol 50 MG tablet   Commonly known as: ULTRAM   Take 50 mg by mouth every 6 (six) hours as needed. pain      VOLTAREN 1 % Gel   Generic drug: diclofenac sodium   APPLY 1 APPLICATION TO AFFECTED AREA TOPICALLY FOUR TIMES A DAY  zolpidem 5 MG tablet   Commonly known as: AMBIEN   Take 5 mg by mouth at bedtime as needed. For insomnia            Disposition   Discharge Orders    Future Appointments: Provider: Department: Dept Phone: Center:   08/02/2012 1:45 PM Everrett Coombe, DO [facility] [phone] [facility]     Future Orders Please Complete By Expires   Diet - low sodium heart healthy      Increase activity slowly      Discharge instructions      Comments:   * KEEP WRIST CATHETERIZATION SITE CLEAN AND DRY. Call the office for any signs of bleeding, pus, swelling, increased pain, or any other concerns. * NO HEAVY LIFTING (>10lbs) OR SEXUAL ACTIVITY X 7 DAYS. * NO DRIVING X 2-3 DAYS. * NO SOAKING BATHS, HOT TUBS, POOLS, ETC., X 7 DAYS.  * Your heart catheterization showed normal coronary arteries (no blockages) and normal heart function. Please follow up with your primary care provider.     Follow-up Information    Follow up with MATTHEWS,CODY, DO. (As scheduled)    Contact information:   [address] [place] [address] [phone]           Outstanding Labs/Studies:  None  Duration of Discharge Encounter: Greater than 30 minutes including physician and PA time.  Signed, Rhiannon Sassaman PA-C 07/27/2012, 10:55 AM

## 2012-07-27 NOTE — ED Provider Notes (Signed)
History     CSN: [id number]  Arrival date & time 07/26/12  7829   First MD Initiated Contact with Patient 07/27/12 0115      Chief Complaint  Patient presents with  . Chest Pain    (Consider location/radiation/quality/duration/timing/severity/associated sxs/prior treatment) HPI Pt with episodic CP for sometime p/w worsening sharp L chest pain starting this evening, radiating to L arm. Mild associated SOB without acute swelling or pain to extremities. No fever, chills, cough. Dr Charm Barges expecting pt and will see in ED.  Past Medical History  Diagnosis Date  . Morbid obesity   . Extreme obesity with alveolar hypoventilation   . Alveolar hypoventilation   . Asthma   . GERD (gastroesophageal reflux disease)   . Rectal fissure   . Hypertension   . Costochondritis   . Cellulitis 08/2010-08/2011  . Depression   . Bipolar 2 disorder   . Obstructive sleep apnea   . Chest pain   . Shortness of breath   . Diabetes mellitus 2000  . Arthritis     Past Surgical History  Procedure Date  . Carpal tunnel release   . Bil knee surgery     Family History  Problem Relation Age of Onset  . Diabetes Mother   . Hyperlipidemia Mother   . Depression Mother   . Heart attack Paternal Uncle   . Heart disease Paternal Grandmother   . Heart attack Paternal Grandmother   . Heart attack Paternal Grandfather   . Heart disease Paternal Grandfather     History  Substance Use Topics  . Smoking status: Former Smoker -- 0.3 packs/day for .3 years    Types: Cigarettes    Quit date: 12/06/1993  . Smokeless tobacco: Never Used  . Alcohol Use: No    OB History    Grav Para Term Preterm Abortions TAB SAB Ect Mult Living                  Review of Systems  Constitutional: Negative for fever and chills.  Respiratory: Positive for shortness of breath. Negative for cough and wheezing.   Cardiovascular: Positive for chest pain. Negative for palpitations and leg swelling.  Gastrointestinal:  Negative for nausea, vomiting and abdominal pain.  Musculoskeletal: Negative for back pain.  Skin: Positive for wound. Negative for rash.  Neurological: Negative for syncope, weakness and headaches.    Allergies  Kiwi extract and Nubain  Home Medications   Current Outpatient Rx  Name Route Sig Dispense Refill  . ALBUTEROL SULFATE HFA 108 (90 BASE) MCG/ACT IN AERS Inhalation Inhale 2 puffs into the lungs every 4 (four) hours as needed. For wheezing, shortness of breath    . ASPIRIN 81 MG PO TBEC Oral Take 1 tablet (81 mg total) by mouth daily.    . BUDESONIDE-FORMOTEROL FUMARATE 160-4.5 MCG/ACT IN AERO Inhalation Inhale 2 puffs into the lungs 2 (two) times daily.    Marland Kitchen CARVEDILOL 6.25 MG PO TABS Oral Take 6.25 mg by mouth 2 (two) times daily with a meal.     . CYCLOBENZAPRINE HCL 10 MG PO TABS  TAKE 1 TABLET BY MOUTH THREE TIMES A DAY AS NEEDED FOR MUSCLE SPASMS 30 tablet 3  . GLIMEPIRIDE 4 MG PO TABS Oral Take 4 mg by mouth daily before breakfast.      . GLUCOSE BLOOD VI STRP  Check blood sugars  before and after meals and at bedtime. 200 each 2  . IBUPROFEN 600 MG PO TABS Oral Take 1 tablet (  600 mg total) by mouth every 6 (six) hours as needed for pain. 30 tablet 0  . INSULIN ASPART 100 UNIT/ML [place] SOLN Subcutaneous Inject 45 Units into the skin 3 (three) times daily before meals.    . INSULIN GLARGINE 100 UNIT/ML [place] SOLN Subcutaneous Inject 105 Units into the skin 2 (two) times daily. 5 pen 15  . LISINOPRIL 10 MG PO TABS Oral Take 10 mg by mouth daily.      Marland Kitchen NITROGLYCERIN 0.4 MG SL SUBL Sublingual Place 1 tablet (0.4 mg total) under the tongue every 5 (five) minutes x 3 doses as needed for chest pain. 25 tablet 0  . NYSTATIN 100000 UNIT/GM EX POWD Topical Apply 1 Units topically daily. Applies under stomach    . TRAMADOL HCL 50 MG PO TABS Oral Take 50 mg by mouth every 6 (six) hours as needed. pain    . VOLTAREN 1 % TD GEL  APPLY 1 APPLICATION TO AFFECTED AREA TOPICALLY FOUR TIMES A DAY  100 g 2  . ZOLPIDEM TARTRATE 5 MG PO TABS Oral Take 5 mg by mouth at bedtime as needed. For insomnia    . PANTOPRAZOLE SODIUM 40 MG PO TBEC Oral Take 40 mg by mouth daily.        BP 134/89  Pulse 109  Temp 98.4 F (36.9 C) (Oral)  Resp 22  SpO2 97%  LMP 07/24/2012  Physical Exam  Nursing note and vitals reviewed. Constitutional: She is oriented to person, place, and time. She appears well-developed and well-nourished. No distress.  HENT:  Head: Normocephalic and atraumatic.  Mouth/Throat: Oropharynx is clear and moist.  Eyes: EOM are normal. Pupils are equal, round, and reactive to light.  Neck: Normal range of motion. Neck supple.  Cardiovascular: Normal rate and regular rhythm.   Pulmonary/Chest: Effort normal and breath sounds normal. No respiratory distress. She has no wheezes. She has no rales. She exhibits no tenderness.  Abdominal: Soft. Bowel sounds are normal. She exhibits no distension. There is no tenderness. There is no rebound.  Musculoskeletal: Normal range of motion. She exhibits no edema and no tenderness.  Neurological: She is alert and oriented to person, place, and time.  Skin: Skin is warm and dry. No rash noted. No erythema.  Psychiatric: She has a normal mood and affect. Her behavior is normal.    ED Course  Procedures (including critical care time)  Labs Reviewed  BASIC METABOLIC PANEL - Abnormal; Notable for the following:    CO2 33 (*)     Glucose, Bld 204 (*)     All other components within normal limits  CBC WITH DIFFERENTIAL  POCT I-STAT TROPONIN I   Dg Chest 2 View  07/27/2012  *RADIOLOGY REPORT*  Clinical Data: Chest pain radiating down the left arm.  Nausea and shortness of breath.  CHEST - 2 VIEW  Comparison: 07/25/2012  Findings: The heart size and pulmonary vascularity are normal. The lungs appear clear and expanded without focal air space disease or consolidation. No blunting of the costophrenic angles.  No pneumothorax.  Mediastinal  contours appear intact.  Degenerative changes in the spine and shoulders.  Stable appearance since previous study.  IMPRESSION: No evidence of active pulmonary disease.   Original Report Authenticated By: Marlon Pel, M.D.    Dg Chest 2 View  07/25/2012  *RADIOLOGY REPORT*  Clinical Data: Left chest pain  CHEST - 2 VIEW  Comparison: 06/27/2012  Findings: Lungs are clear without infiltrate or effusion.  Negative for  heart failure or mass.  IMPRESSION: No active cardiopulmonary disease.   Original Report Authenticated By: Camelia Phenes, M.D.      1. Chest pain       Date: 07/27/2012  Rate: 106  Rhythm: sinus tachycardia  QRS Axis: normal  Intervals: normal  ST/T Wave abnormalities: normal  Conduction Disutrbances:none  Narrative Interpretation:   Old EKG Reviewed: none available   MDM  Dr Charm Barges to admit.         Loren Racer, MD 07/27/12 [phone]

## 2012-07-27 NOTE — ED Notes (Signed)
Spoke with Dr. Charm Barges by phone. Dr. Charm Barges states "pain is not cardiac".

## 2012-07-31 ENCOUNTER — Other Ambulatory Visit: Payer: Self-pay | Admitting: Family Medicine

## 2012-07-31 ENCOUNTER — Ambulatory Visit (INDEPENDENT_AMBULATORY_CARE_PROVIDER_SITE_OTHER): Payer: Medicaid Other | Admitting: Emergency Medicine

## 2012-07-31 ENCOUNTER — Encounter: Payer: Self-pay | Admitting: Gastroenterology

## 2012-07-31 ENCOUNTER — Telehealth: Payer: Self-pay | Admitting: Family Medicine

## 2012-07-31 VITALS — BP 127/82 | HR 114 | Temp 98.0°F | Ht 62.0 in | Wt 373.7 lb

## 2012-07-31 DIAGNOSIS — M549 Dorsalgia, unspecified: Secondary | ICD-10-CM

## 2012-07-31 DIAGNOSIS — M25569 Pain in unspecified knee: Secondary | ICD-10-CM

## 2012-07-31 DIAGNOSIS — M25562 Pain in left knee: Secondary | ICD-10-CM

## 2012-07-31 DIAGNOSIS — S39012A Strain of muscle, fascia and tendon of lower back, initial encounter: Secondary | ICD-10-CM

## 2012-07-31 DIAGNOSIS — M25561 Pain in right knee: Secondary | ICD-10-CM

## 2012-07-31 MED ORDER — HYDROCODONE-ACETAMINOPHEN 10-325 MG PO TABS: 1.0000 | ORAL_TABLET | Freq: Two times a day (BID) | ORAL | Status: AC | PRN

## 2012-07-31 NOTE — Telephone Encounter (Signed)
Lower back pain since this am.  Requesting to be seen today.

## 2012-07-31 NOTE — Patient Instructions (Signed)
It was nice to see you!  I'm sorry your back is hurting so much.  But I'm really glad you are doing so much walking.  Losing some weight is going to help your back pain more than any medication we can give you.  I spoke with your primary doctor, Dr. Ashley Royalty, and he was okay with me refilling your Vicodin prescription.  He still wants to see you in clinic on Wednesday to discuss the chest pain.  Follow up with Dr. Ashley Royalty as scheduled.

## 2012-07-31 NOTE — Progress Notes (Signed)
  Subjective:    Patient ID: [name], female    DOB: 06/20/1976, 36 y.o.   MRN: [id number]  HPI [name] is here for work-in appt for back pain.  Reports having the low back pain for at least a year; diagnosed with arthritis a while ago.  Dr. Denyse Amass started her on Vicodin to help her get out and walk.  Has been walking daily, tries for 15-20 minutes, and walks around the house during commercials.  Back pain was well controlled until yesterday as she ran out of vicodin two days ago and has continued walking.  No weakness, numbness, tingling, bowel/bladder incontinence.  I have reviewed and updated the following as appropriate: allergies and current medications SHx: non smoker, exposed to 2nd hand smoke  Review of Systems See HPI    Objective:   Physical Exam BP 127/82  Pulse 114  Temp 98 F (36.7 C) (Oral)  Ht 5\' 2"  (1.575 m)  Wt 373 lb 11.2 oz (169.509 kg)  BMI 68.35 kg/m2  LMP 07/24/2012 Gen: alert, cooperative, NAD, morbidly obese CV: RRR, no murmurs Pulm: CTAB Back: no erythema or deformity, tender over lumbar spine worse in the paraspinous muscles; forward and lateral flexion limited by pain, no pain with rotation      Assessment & Plan:

## 2012-07-31 NOTE — Assessment & Plan Note (Addendum)
Acute flare likely related to increased walking and no medication for the last 2 days. Encouraged walking as weight loss is the best treatment.  Discussed with PCP, Dr. Ashley Royalty, and will refill vicodin today, 10-325 #60.  Follow up with Dr. Ashley Royalty as scheduled on Wednesday.

## 2012-07-31 NOTE — Telephone Encounter (Signed)
Returned call to patient.  C/o back pain and has an appt for Wednesday.  Unable to wait until then and requesting appt for today.  Appt scheduled for today on [facility] overflow clinic and patient will be here by 3:30pm.  Gaylene Brooks, RN

## 2012-08-01 ENCOUNTER — Other Ambulatory Visit: Payer: Medicaid Other

## 2012-08-02 ENCOUNTER — Encounter: Payer: Self-pay | Admitting: Family Medicine

## 2012-08-02 ENCOUNTER — Other Ambulatory Visit: Payer: Self-pay | Admitting: Family Medicine

## 2012-08-02 ENCOUNTER — Telehealth: Payer: Self-pay | Admitting: *Deleted

## 2012-08-02 ENCOUNTER — Ambulatory Visit (INDEPENDENT_AMBULATORY_CARE_PROVIDER_SITE_OTHER): Payer: Medicaid Other | Admitting: Family Medicine

## 2012-08-02 VITALS — BP 169/88 | HR 116 | Ht 62.0 in | Wt 375.0 lb

## 2012-08-02 DIAGNOSIS — E1165 Type 2 diabetes mellitus with hyperglycemia: Secondary | ICD-10-CM

## 2012-08-02 DIAGNOSIS — I10 Essential (primary) hypertension: Secondary | ICD-10-CM

## 2012-08-02 DIAGNOSIS — R079 Chest pain, unspecified: Secondary | ICD-10-CM

## 2012-08-02 DIAGNOSIS — IMO0001 Reserved for inherently not codable concepts without codable children: Secondary | ICD-10-CM

## 2012-08-02 DIAGNOSIS — IMO0002 Reserved for concepts with insufficient information to code with codable children: Secondary | ICD-10-CM

## 2012-08-02 MED ORDER — ALBUTEROL SULFATE HFA 108 (90 BASE) MCG/ACT IN AERS: 2.0000 | INHALATION_SPRAY | RESPIRATORY_TRACT | Status: DC | PRN

## 2012-08-02 MED ORDER — INSULIN ASPART 100 UNIT/ML ~~LOC~~ SOLN: 50.0000 [IU] | Freq: Three times a day (TID) | SUBCUTANEOUS | Status: DC

## 2012-08-02 NOTE — Patient Instructions (Addendum)
Thank you for coming in today, it was good to see you Please increase your novolog (mealtime insulin) to 50 units with each meal.  I will see you back in 2 months or sooner as needed.  Be sure to take your blood pressure medication every day.

## 2012-08-02 NOTE — Telephone Encounter (Signed)
Pharmacy calling to request refill of ProAir inhaler.  Will route request to Dr. Ashley Royalty.  Gaylene Brooks, RN

## 2012-08-03 ENCOUNTER — Other Ambulatory Visit: Payer: Medicaid Other

## 2012-08-09 NOTE — Assessment & Plan Note (Signed)
Weight stable for now. Walking some for exercise.  No change in diet.

## 2012-08-09 NOTE — Assessment & Plan Note (Signed)
>>  ASSESSMENT AND PLAN FOR INSULIN DEPENDENT TYPE 2 DIABETES MELLITUS ([name]) WRITTEN ON 08/09/2012 11:05 PM BY MATTHEWS, CODY  Fasting glucose inmproved, now below 200.  Post prandials still quite high, increase novolog to 50 units at mealtime. F/u in 2 months.

## 2012-08-09 NOTE — Assessment & Plan Note (Signed)
BP elevated today.  NOT compliant with meds.  Advsied to restart medications. Red flags reviewed.

## 2012-08-09 NOTE — Assessment & Plan Note (Signed)
Fasting glucose inmproved, now below 200.  Post prandials still quite high, increase novolog to 50 units at mealtime. F/u in 2 months.

## 2012-08-09 NOTE — Progress Notes (Signed)
  Subjective:    Patient ID: [name], female    DOB: 06-May-1976, 36 y.o.   MRN: [id number]  HPI  1. DM:  Poorly controlled in the past. She has been compliant with insulin.  Her most recent fasting glucose have been in the 160-180 range.  Post prandials have been 230-300.  She denies increased thirst or urination.  She has made not changes in diet.  She is walking some for exercise.  2. HTN:  BP elevated today.  She has not taken BP meds x2 days.  She states she has been "too busy". She has had chest pain in the past and recently had a clean cardiac cath.  She denies chest pain today.  No shortness of breath, headache, palpitations.  Review of Systems Per HPI    Objective:   Physical Exam  Constitutional:       Morbidly obese female, nad   Cardiovascular: Normal rate and regular rhythm.   Pulmonary/Chest: Effort normal and breath sounds normal.  Neurological: She is alert.          Assessment & Plan:

## 2012-08-11 ENCOUNTER — Ambulatory Visit: Payer: Medicaid Other | Admitting: Family Medicine

## 2012-08-24 ENCOUNTER — Other Ambulatory Visit: Payer: Self-pay | Admitting: Family Medicine

## 2012-08-25 ENCOUNTER — Ambulatory Visit (INDEPENDENT_AMBULATORY_CARE_PROVIDER_SITE_OTHER): Payer: Medicaid Other | Admitting: Family Medicine

## 2012-08-25 ENCOUNTER — Encounter: Payer: Self-pay | Admitting: Family Medicine

## 2012-08-25 ENCOUNTER — Other Ambulatory Visit: Payer: Self-pay | Admitting: Family Medicine

## 2012-08-25 DIAGNOSIS — IMO0002 Reserved for concepts with insufficient information to code with codable children: Secondary | ICD-10-CM

## 2012-08-25 DIAGNOSIS — M25539 Pain in unspecified wrist: Secondary | ICD-10-CM

## 2012-08-25 DIAGNOSIS — E1165 Type 2 diabetes mellitus with hyperglycemia: Secondary | ICD-10-CM

## 2012-08-25 DIAGNOSIS — IMO0001 Reserved for inherently not codable concepts without codable children: Secondary | ICD-10-CM

## 2012-08-25 LAB — LIPID PANEL
HDL: 42 mg/dL (ref >39)
Total CHOL/HDL Ratio: 6.7 Ratio

## 2012-08-25 MED ORDER — HYDROCODONE-ACETAMINOPHEN 5-325 MG PO TABS: 1.0000 | ORAL_TABLET | Freq: Two times a day (BID) | ORAL | Status: DC | PRN

## 2012-08-25 MED ORDER — HYDROCODONE-ACETAMINOPHEN 10-325 MG PO TABS: 1.0000 | ORAL_TABLET | Freq: Two times a day (BID) | ORAL | Status: DC | PRN

## 2012-08-25 NOTE — Patient Instructions (Addendum)
Thank you for coming in today, it was good to see you Please increase your lantus to 110 units twice per day, keep track of your sugars Great job on the weight loss! I will see you back in two months or sooner as needed.

## 2012-08-25 NOTE — Telephone Encounter (Signed)
All future refills should be sent to Dr. Everrett Coombe at [phone] and fax number of [phone]

## 2012-08-30 ENCOUNTER — Other Ambulatory Visit: Payer: Self-pay | Admitting: Family Medicine

## 2012-08-31 DIAGNOSIS — M25539 Pain in unspecified wrist: Secondary | ICD-10-CM | POA: Insufficient documentation

## 2012-08-31 NOTE — Assessment & Plan Note (Signed)
NVI on right wrist, she may have a little nerve irritation from previous cath or this may represent her previous dx of carpal tunnel syndrome.  No signs of infection.

## 2012-08-31 NOTE — Assessment & Plan Note (Signed)
Weight down by 5 lbs since last visit.  She will continue walking.  Vicodin refilled for her to use to ease pain while walking.

## 2012-08-31 NOTE — Progress Notes (Signed)
  Subjective:    Patient ID: [name], female    DOB: 1976/06/24, 36 y.o.   MRN: [id number]  HPI  1. Diabetes:  Blood sugars have overall improved somewhat, however she is still getting reading >250 post prandial.  She is around 175 fasting in the morning.  She denies any symptoms of hypoglycemia.  She is compliant with her insulin regimen.  She is trying to eat better and is walking twice per day.  2. Wrist pain:  Has had R wrist pain since cardiac cath last month.  Pain is over area where radial catheter was inserted.  Pain comes and goes, sometimes radiates into hand.  She has not noticed any swelling, numbness, tingling, redness or warmth around the area.  She has full function of the hand without any noticeable weakness.   3.  Obesity:  She is walking and is trying to change her diet some by incorporating more vegetables.  She feels like vicodin controls her pain enough that she is able to get out and walk some.  Review of Systems Per hpi    Objective:   Physical Exam  Constitutional:       Morbidly obese, nad   Cardiovascular: Normal rate and regular rhythm.   Pulmonary/Chest: Effort normal and breath sounds normal.  Musculoskeletal: She exhibits edema (trace).       Right wrist with pinpoint area of previous cath, non tender.  No surrounding erythema or warmth.  Pulse 2+  Neurological: She is alert.          Assessment & Plan:

## 2012-08-31 NOTE — Assessment & Plan Note (Signed)
>>  ASSESSMENT AND PLAN FOR INSULIN DEPENDENT TYPE 2 DIABETES MELLITUS ([name]) WRITTEN ON 08/31/2012 11:20 PM BY MATTHEWS, CODY  Blood glucose still not optimally controlled, increase lantus to 110 units BID

## 2012-08-31 NOTE — Assessment & Plan Note (Signed)
Blood glucose still not optimally controlled, increase lantus to 110 units BID

## 2012-09-11 ENCOUNTER — Encounter: Payer: Self-pay | Admitting: Family Medicine

## 2012-09-13 ENCOUNTER — Other Ambulatory Visit: Payer: Self-pay | Admitting: *Deleted

## 2012-09-13 NOTE — Telephone Encounter (Signed)
Patient calling with pharmacy name that she would like the Cholesterol medication sent to.  [facility].

## 2012-09-14 NOTE — Telephone Encounter (Signed)
Needs refill on cholesterol med, forward to PCP for Rx.Busick, Rodena Medin

## 2012-09-27 ENCOUNTER — Other Ambulatory Visit: Payer: Self-pay | Admitting: Family Medicine

## 2012-10-16 ENCOUNTER — Encounter: Payer: Self-pay | Admitting: Home Health Services

## 2012-10-17 ENCOUNTER — Encounter: Payer: Self-pay | Admitting: Home Health Services

## 2012-10-25 ENCOUNTER — Other Ambulatory Visit: Payer: Self-pay | Admitting: Family Medicine

## 2012-11-08 ENCOUNTER — Ambulatory Visit: Payer: Medicaid Other | Admitting: Family Medicine

## 2012-11-13 ENCOUNTER — Ambulatory Visit (INDEPENDENT_AMBULATORY_CARE_PROVIDER_SITE_OTHER): Payer: Medicaid Other | Admitting: Family Medicine

## 2012-11-13 ENCOUNTER — Encounter: Payer: Self-pay | Admitting: Family Medicine

## 2012-11-13 VITALS — BP 124/72 | HR 80 | Ht 62.0 in | Wt 342.0 lb

## 2012-11-13 DIAGNOSIS — IMO0001 Reserved for inherently not codable concepts without codable children: Secondary | ICD-10-CM

## 2012-11-13 DIAGNOSIS — IMO0002 Reserved for concepts with insufficient information to code with codable children: Secondary | ICD-10-CM

## 2012-11-13 DIAGNOSIS — E1165 Type 2 diabetes mellitus with hyperglycemia: Secondary | ICD-10-CM

## 2012-11-13 DIAGNOSIS — E785 Hyperlipidemia, unspecified: Secondary | ICD-10-CM

## 2012-11-13 LAB — POCT GLYCOSYLATED HEMOGLOBIN (HGB A1C): Hemoglobin A1C: >14

## 2012-11-13 MED ORDER — ATORVASTATIN CALCIUM 20 MG PO TABS: 20.0000 mg | ORAL_TABLET | Freq: Every day | ORAL | Status: DC

## 2012-11-13 MED ORDER — HYDROCODONE-ACETAMINOPHEN 10-325 MG PO TABS: 1.0000 | ORAL_TABLET | Freq: Two times a day (BID) | ORAL | Status: AC | PRN

## 2012-11-13 MED ORDER — METFORMIN HCL 500 MG PO TABS: 500.0000 mg | ORAL_TABLET | Freq: Two times a day (BID) | ORAL | Status: DC

## 2012-11-13 MED ORDER — TRAMADOL HCL 50 MG PO TABS: 50.0000 mg | ORAL_TABLET | Freq: Four times a day (QID) | ORAL | Status: DC | PRN

## 2012-11-13 NOTE — Patient Instructions (Addendum)
Thank you for coming in today, it was good to see you Try the metformin again if unable to tolerate let me know Increase lantus to 115 units two times per day Start lipitor Follow up with me in 1 month so we can see how your sugars are doing.

## 2012-11-19 DIAGNOSIS — E785 Hyperlipidemia, unspecified: Secondary | ICD-10-CM | POA: Insufficient documentation

## 2012-11-19 NOTE — Assessment & Plan Note (Signed)
LDL significantly elevated at 195.  Start lipitor 20mg , recheck in 3 months.

## 2012-11-19 NOTE — Progress Notes (Signed)
  Subjective:    Patient ID: [name], female    DOB: 23-Oct-1976, 36 y.o.   MRN: [id number]  HPI 1. DM: Here for f/u of DM.  States that she is still getting CBG's in the 200-300 range fasting and 150-200 range post prandial.  Currently using 105 units of lantus BID and 50 units of novolog with each meal.  Has been on metformin in the past but did not tolerate higher doses so she stopped.  She is walking for exercise (using hydrocodone for pain control).  Her weight is down ~ 30 lbs since her last visit.  She denies excess thirst, urination, or vision changes.    2.  HLD:  Last lipid panel showed elevated LDL at 195.  SHe is currently not on a lipid lowering medication.  She has recently tried to improve her diet.    3. Obesity:  As above walking about 1/2 mile per day.  She is also trying to make some dietary changes such as cutting out fried and fast foods as well as decreasing her carbohydrate intake.    Review of Systems Per HPI    Objective:   Physical Exam  Constitutional:       Morbidly obese female, nad   Cardiovascular: Normal rate and regular rhythm.   Pulmonary/Chest: Effort normal and breath sounds normal.  Musculoskeletal: She exhibits no edema.  Neurological: She is alert.          Assessment & Plan:

## 2012-11-19 NOTE — Assessment & Plan Note (Addendum)
Congratulated on weight loss.  Counseled on continuing to increase exercise and dietary changes for weight loss.  Hydrocodone prn for knee pain with exercise, as she continues to lose weight plan to wean.

## 2012-11-19 NOTE — Assessment & Plan Note (Signed)
Very poorly controlled.  Weight loss will likely help, however fasting cbg's still quite high.  Will increase lantus to 115 units BID.  Continue novolog.  Will add back on low dose metformin as well. Follow up with me 1 month to reassess.

## 2012-11-19 NOTE — Assessment & Plan Note (Signed)
>>  ASSESSMENT AND PLAN FOR INSULIN DEPENDENT TYPE 2 DIABETES MELLITUS ([name]) WRITTEN ON 11/19/2012  9:37 PM BY MATTHEWS, CODY  Very poorly controlled.  Weight loss will likely help, however fasting cbg's still quite high.  Will increase lantus to 115 units BID.  Continue novolog.  Will add back on low dose metformin as well. Follow up with me 1 month to reassess.

## 2012-11-21 ENCOUNTER — Other Ambulatory Visit: Payer: Self-pay | Admitting: Family Medicine

## 2012-11-23 ENCOUNTER — Other Ambulatory Visit: Payer: Self-pay | Admitting: Family Medicine

## 2012-12-14 ENCOUNTER — Ambulatory Visit: Payer: Medicaid Other | Admitting: Family Medicine

## 2012-12-18 ENCOUNTER — Other Ambulatory Visit: Payer: Self-pay | Admitting: *Deleted

## 2012-12-18 MED ORDER — METFORMIN HCL 500 MG PO TABS: 500.0000 mg | ORAL_TABLET | Freq: Two times a day (BID) | ORAL | Status: DC

## 2012-12-19 ENCOUNTER — Telehealth: Payer: Self-pay | Admitting: *Deleted

## 2012-12-19 ENCOUNTER — Other Ambulatory Visit: Payer: Self-pay | Admitting: Family Medicine

## 2012-12-19 NOTE — Telephone Encounter (Signed)
fwd

## 2012-12-19 NOTE — Telephone Encounter (Signed)
Patient calling due to CBG 384 at 2:00 pm.  C/o feeling "flushed, lightheaded, and weak."  FBS this morning--115 at 7:00 am.  Had 115 units of Lantus insulin this morning.  CBG at lunch--150 and had 45 units of Novolog insulin.  Ate chicken sandwich and drank water for lunch.  Will route to Dr. Ashley Royalty for advice and call patient back.  Gaylene Brooks, RN

## 2012-12-19 NOTE — Telephone Encounter (Signed)
Discussed with Dr. Ashley Royalty.  Patient informed to make sure she is drinking plenty of fluids.  Check CBG this evening and continue insulin regimen.  Informed to call back for office visit tomorrow or Thursday if not feeling better or patient can go to urgent care to be evaluated after hours.  Patient verbalized understanding.  Gaylene Brooks, RN

## 2012-12-20 ENCOUNTER — Emergency Department (HOSPITAL_COMMUNITY): Payer: Medicaid Other

## 2012-12-20 ENCOUNTER — Emergency Department (HOSPITAL_COMMUNITY)
Admission: EM | Admit: 2012-12-20 | Discharge: 2012-12-20 | Disposition: A | Discharge status: Home / Self Care | Payer: Medicaid Other | Attending: Emergency Medicine | Admitting: Emergency Medicine

## 2012-12-20 ENCOUNTER — Telehealth: Payer: Self-pay | Admitting: Emergency Medicine

## 2012-12-20 ENCOUNTER — Encounter (HOSPITAL_COMMUNITY): Payer: Self-pay

## 2012-12-20 DIAGNOSIS — Z79899 Other long term (current) drug therapy: Secondary | ICD-10-CM | POA: Insufficient documentation

## 2012-12-20 DIAGNOSIS — Z8739 Personal history of other diseases of the musculoskeletal system and connective tissue: Secondary | ICD-10-CM | POA: Insufficient documentation

## 2012-12-20 DIAGNOSIS — K219 Gastro-esophageal reflux disease without esophagitis: Secondary | ICD-10-CM | POA: Insufficient documentation

## 2012-12-20 DIAGNOSIS — E119 Type 2 diabetes mellitus without complications: Secondary | ICD-10-CM | POA: Insufficient documentation

## 2012-12-20 DIAGNOSIS — F3289 Other specified depressive episodes: Secondary | ICD-10-CM | POA: Insufficient documentation

## 2012-12-20 DIAGNOSIS — F3189 Other bipolar disorder: Secondary | ICD-10-CM | POA: Insufficient documentation

## 2012-12-20 DIAGNOSIS — Z7982 Long term (current) use of aspirin: Secondary | ICD-10-CM | POA: Insufficient documentation

## 2012-12-20 DIAGNOSIS — F329 Major depressive disorder, single episode, unspecified: Secondary | ICD-10-CM | POA: Insufficient documentation

## 2012-12-20 DIAGNOSIS — Z8709 Personal history of other diseases of the respiratory system: Secondary | ICD-10-CM | POA: Insufficient documentation

## 2012-12-20 DIAGNOSIS — I10 Essential (primary) hypertension: Secondary | ICD-10-CM | POA: Insufficient documentation

## 2012-12-20 DIAGNOSIS — Z87891 Personal history of nicotine dependence: Secondary | ICD-10-CM | POA: Insufficient documentation

## 2012-12-20 DIAGNOSIS — Z872 Personal history of diseases of the skin and subcutaneous tissue: Secondary | ICD-10-CM | POA: Insufficient documentation

## 2012-12-20 DIAGNOSIS — G4733 Obstructive sleep apnea (adult) (pediatric): Secondary | ICD-10-CM | POA: Insufficient documentation

## 2012-12-20 DIAGNOSIS — Z794 Long term (current) use of insulin: Secondary | ICD-10-CM | POA: Insufficient documentation

## 2012-12-20 DIAGNOSIS — E785 Hyperlipidemia, unspecified: Secondary | ICD-10-CM | POA: Insufficient documentation

## 2012-12-20 DIAGNOSIS — J45909 Unspecified asthma, uncomplicated: Secondary | ICD-10-CM | POA: Insufficient documentation

## 2012-12-20 DIAGNOSIS — Z3202 Encounter for pregnancy test, result negative: Secondary | ICD-10-CM | POA: Insufficient documentation

## 2012-12-20 DIAGNOSIS — Z8719 Personal history of other diseases of the digestive system: Secondary | ICD-10-CM | POA: Insufficient documentation

## 2012-12-20 DIAGNOSIS — R109 Unspecified abdominal pain: Secondary | ICD-10-CM | POA: Insufficient documentation

## 2012-12-20 LAB — CBC WITH DIFFERENTIAL/PLATELET
Basophils Relative: 0 % (ref 0–1)
Eosinophils Relative: 2 % (ref 0–5)
HCT: 42.6 % (ref 36.0–46.0)
Hemoglobin: 15.2 g/dL — ABNORMAL HIGH (ref 12.0–15.0)
MCH: 31 pg (ref 26.0–34.0)
MCHC: 35.7 g/dL (ref 30.0–36.0)
MCV: 86.9 fL (ref 78.0–100.0)
Monocytes Absolute: 0.7 10*3/uL (ref 0.1–1.0)
Monocytes Relative: 6 % (ref 3–12)
Neutro Abs: 5.3 10*3/uL (ref 1.7–7.7)
RDW: 12.8 % (ref 11.5–15.5)

## 2012-12-20 LAB — BASIC METABOLIC PANEL
BUN: 12 mg/dL (ref 6–23)
Calcium: 9.1 mg/dL (ref 8.4–10.5)
Chloride: 92 mEq/L — ABNORMAL LOW (ref 96–112)
Creatinine, Ser: 0.39 mg/dL — ABNORMAL LOW (ref 0.50–1.10)
GFR calc Af Amer: >90 mL/min (ref >90)

## 2012-12-20 LAB — URINALYSIS, ROUTINE W REFLEX MICROSCOPIC
Bilirubin Urine: NEGATIVE
Glucose, UA: >1000 mg/dL — AB
Ketones, ur: 15 mg/dL — AB
Protein, ur: 100 mg/dL — AB
Urobilinogen, UA: 0.2 mg/dL (ref 0.0–1.0)

## 2012-12-20 LAB — URINE MICROSCOPIC-ADD ON

## 2012-12-20 LAB — GC/CHLAMYDIA PROBE AMP: CT Probe RNA: NEGATIVE

## 2012-12-20 LAB — PREGNANCY, URINE: Preg Test, Ur: NEGATIVE

## 2012-12-20 LAB — WET PREP, GENITAL: WBC, Wet Prep HPF POC: NONE SEEN

## 2012-12-20 MED ORDER — ONDANSETRON 4 MG PO TBDP: 8.0000 mg | ORAL_TABLET | Freq: Three times a day (TID) | ORAL | Status: DC | PRN

## 2012-12-20 MED ORDER — METOCLOPRAMIDE HCL 5 MG/ML IJ SOLN: 10.0000 mg | INTRAMUSCULAR | Status: DC

## 2012-12-20 MED ORDER — ONDANSETRON HCL 4 MG/2ML IJ SOLN
4.0000 mg | Freq: Once | INTRAMUSCULAR | Status: AC
  Administered 2012-12-20: 4 mg via INTRAVENOUS
  Filled 2012-12-20: qty 2

## 2012-12-20 MED ORDER — IOHEXOL 300 MG/ML  SOLN
100.0000 mL | Freq: Once | INTRAMUSCULAR | Status: AC | PRN
  Administered 2012-12-20: 100 mL via INTRAVENOUS

## 2012-12-20 MED ORDER — OXYCODONE-ACETAMINOPHEN 5-325 MG PO TABS: 2.0000 | ORAL_TABLET | ORAL | Status: DC | PRN

## 2012-12-20 MED ORDER — MORPHINE SULFATE 4 MG/ML IJ SOLN
8.0000 mg | Freq: Once | INTRAMUSCULAR | Status: AC
  Administered 2012-12-20: 8 mg via INTRAVENOUS
  Filled 2012-12-20: qty 2

## 2012-12-20 MED ORDER — PROMETHAZINE HCL 25 MG/ML IJ SOLN
25.0000 mg | Freq: Once | INTRAMUSCULAR | Status: AC
  Administered 2012-12-20: 25 mg via INTRAVENOUS
  Filled 2012-12-20: qty 1

## 2012-12-20 MED ORDER — SODIUM CHLORIDE 0.9 % IV BOLUS (SEPSIS)
1000.0000 mL | Freq: Once | INTRAVENOUS | Status: AC
  Administered 2012-12-20: 1000 mL via INTRAVENOUS

## 2012-12-20 MED ORDER — IOHEXOL 300 MG/ML  SOLN
50.0000 mL | INTRAMUSCULAR | Status: AC
  Administered 2012-12-20: 50 mL via ORAL

## 2012-12-20 MED ORDER — METOCLOPRAMIDE HCL 5 MG/ML IJ SOLN
10.0000 mg | Freq: Once | INTRAMUSCULAR | Status: AC
  Administered 2012-12-20: 10 mg via INTRAVENOUS
  Filled 2012-12-20: qty 2

## 2012-12-20 NOTE — ED Provider Notes (Signed)
6:11 AM Patient signed out to me by Jaci Carrel, PA-C. Patient disposition pending CT abdomen pelvis with contrast.   9:54 AM CT unremarkable. Patient able to tolerate PO. Patient will have follow up with her PCP for further evaluation and management of her symptoms. I will discharge her with pain and nausea medication. Patient afebrile and non toxic appearing. Patient instructed to return with worsening or concerning symptoms.   Emilia Beck, PA-C 12/20/12 [phone]

## 2012-12-20 NOTE — ED Notes (Signed)
Pt had emesis episode. Vommitted large amount.

## 2012-12-20 NOTE — ED Provider Notes (Signed)
History     CSN: [id number]  Arrival date & time 12/20/12  0221   First MD Initiated Contact with Patient 12/20/12 [phone]      Chief Complaint  Patient presents with  . Vaginal Pain    (Consider location/radiation/quality/duration/timing/severity/associated sxs/prior treatment) HPI Comments: [name] is a 37 y.o. female with a history of diabetes, hypertension, and morbid obesity that presents emergency department with a chief complaint of abdominal pain.  Onset of symptoms began acutely today and is described as being located in the suprapubic region.  Patient states that the pain has gradually moved lower to the vaginal region.  Patient denies any abnormal vaginal discharge or malodor, vaginal bleeding, dysuria, hematuria, nausea, vomiting, diarrhea, constipation, fever, night sweats or chills.  Symptoms are moderate in pain is rated at an 8/10 in severity gradually worsening.  Patient states she is unaware she is pregnant or when her last menstrual period was. No other complaints at this time.   The history is provided by the patient.    Past Medical History  Diagnosis Date  . Morbid obesity   . Alveolar hypoventilation   . Asthma   . GERD (gastroesophageal reflux disease)   . Rectal fissure   . Hypertension   . Costochondritis   . Cellulitis 08/2010-08/2011  . Depression   . Bipolar 2 disorder   . Obstructive sleep apnea   . Chest pain     NO CAD by cath 07/27/12, nl LV systolic fxn  . Diabetes mellitus 2000    Type 2, Uncontrolled  . Arthritis   . HLD (hyperlipidemia)     Past Surgical History  Procedure Date  . Carpal tunnel release   . Bil knee surgery     Family History  Problem Relation Age of Onset  . Diabetes Mother   . Hyperlipidemia Mother   . Depression Mother   . Heart attack Paternal Uncle   . Heart disease Paternal Grandmother   . Heart attack Paternal Grandmother   . Heart attack Paternal Grandfather   . Heart disease Paternal Grandfather      History  Substance Use Topics  . Smoking status: Former Smoker -- 0.3 packs/day for .3 years    Types: Cigarettes    Quit date: 12/06/1993  . Smokeless tobacco: Never Used  . Alcohol Use: No    OB History    Grav Para Term Preterm Abortions TAB SAB Ect Mult Living                  Review of Systems  Constitutional: Negative for fever, diaphoresis and activity change.  HENT: Negative for congestion and neck pain.   Respiratory: Negative for cough.   Gastrointestinal: Positive for abdominal pain. Negative for nausea, vomiting, diarrhea, constipation, blood in stool, abdominal distention, anal bleeding and rectal pain.  Genitourinary: Positive for vaginal pain. Negative for dysuria, decreased urine volume and vaginal discharge.  Musculoskeletal: Negative for myalgias.  Skin: Negative for color change and wound.  Neurological: Negative for headaches.  All other systems reviewed and are negative.    Allergies  Kiwi extract and Nubain  Home Medications   Current Outpatient Rx  Name  Route  Sig  Dispense  Refill  . ALBUTEROL SULFATE HFA 108 (90 BASE) MCG/ACT IN AERS   Inhalation   Inhale 2 puffs into the lungs every 4 (four) hours as needed. For wheezing, shortness of breath   1 Inhaler   2   . ASPIRIN 81 MG PO  TBEC   Oral   Take 1 tablet (81 mg total) by mouth daily.         . ATORVASTATIN CALCIUM 20 MG PO TABS   Oral   Take 1 tablet (20 mg total) by mouth daily.   90 tablet   3   . BUDESONIDE-FORMOTEROL FUMARATE 160-4.5 MCG/ACT IN AERO   Inhalation   Inhale 2 puffs into the lungs 2 (two) times daily.         Marland Kitchen CARVEDILOL 6.25 MG PO TABS      TAKE 1 TABLET BY MOUTH TWICE DAILY   60 tablet   6   . CETIRIZINE HCL 10 MG PO TABS      TAKE 1 TABLET BY MOUTH DAILY   30 tablet   PRN   . CYCLOBENZAPRINE HCL 10 MG PO TABS      TAKE 1 TABLET BY MOUTH THREE TIMES A DAY AS NEEDED FOR MUSCLE SPASMS   30 tablet   3   . GLIMEPIRIDE 4 MG PO TABS   Oral    Take 4 mg by mouth daily before breakfast.           . GLUCOSE BLOOD VI STRP      Check blood sugars  before and after meals and at bedtime.   200 each   2   . INSULIN ASPART 100 UNIT/ML [place] SOLN   Subcutaneous   Inject 50 Units into the skin 3 (three) times daily before meals.   1 vial   12   . INSULIN GLARGINE 100 UNIT/ML [place] SOLN   Subcutaneous   Inject 105 Units into the skin 2 (two) times daily.   5 pen   15   . LISINOPRIL 10 MG PO TABS   Oral   Take 10 mg by mouth daily.           Marland Kitchen METFORMIN HCL 500 MG PO TABS   Oral   Take 1 tablet (500 mg total) by mouth 2 (two) times daily with a meal.   180 tablet   1   . NYSTATIN 100000 UNIT/GM EX POWD   Topical   Apply 1 Units topically daily. Applies under stomach         . PANTOPRAZOLE SODIUM 40 MG PO TBEC   Oral   Take 40 mg by mouth daily.           Marland Kitchen PANTOPRAZOLE SODIUM 40 MG PO TBEC      TAKE 1 TABLET BY MOUTH EVERY DAY   30 tablet   PRN   . PANTOPRAZOLE SODIUM 40 MG PO TBEC      TAKE 1 TABLET BY MOUTH EVERY DAY   30 tablet   PRN   . SYMBICORT 160-4.5 MCG/ACT IN AERO      INHALE 2 PUFFS BY MOUTH , INTO THE LUNGS, TWICE DAILY.   1 Inhaler   6   . TRAMADOL HCL 50 MG PO TABS   Oral   Take 1 tablet (50 mg total) by mouth every 6 (six) hours as needed. pain   60 tablet   3   . VOLTAREN 1 % TD GEL      APPLY 1 APPLICATION TO AFFECTED AREA TOPICALLY FOUR TIMES A DAY   100 g   2   . ZOLPIDEM TARTRATE 5 MG PO TABS   Oral   Take 5 mg by mouth at bedtime as needed. For insomnia  BP 150/73  Pulse 108  Temp 98.1 F (36.7 C) (Oral)  Resp 20  SpO2 98%  Physical Exam  Nursing note and vitals reviewed. Constitutional: She is oriented to person, place, and time. She appears well-developed and well-nourished. No distress.  HENT:  Head: Normocephalic and atraumatic.  Eyes: Conjunctivae normal and EOM are normal.  Neck: Normal range of motion.  Cardiovascular:       Mild  tachycardia  Pulmonary/Chest: Effort normal.  Abdominal:       Morbidly obese. Suprapubic tenderness.   Genitourinary:       Exam chaperoned. Limited exam d/t to body habitus. External genitalia with out lesions. No cervical motion tenderness. Cervical os without purulent dc.   Musculoskeletal: Normal range of motion.  Neurological: She is alert and oriented to person, place, and time.  Skin: Skin is warm and dry. No rash noted. She is not diaphoretic.  Psychiatric: She has a normal mood and affect. Her behavior is normal.    ED Course  Procedures (including critical care time)  Labs Reviewed  CBC WITH DIFFERENTIAL - Abnormal; Notable for the following:    WBC 11.2 (*)     Hemoglobin 15.2 (*)     Lymphs Abs 5.0 (*)     All other components within normal limits  URINALYSIS, ROUTINE W REFLEX MICROSCOPIC  PREGNANCY, URINE  BASIC METABOLIC PANEL  WET PREP, GENITAL  GC/CHLAMYDIA PROBE AMP   No results found.   No diagnosis found.    MDM  Pt is seen and examined; Initial history and physical IV fluids, pelvic exam, pain meds & antiemetics given. Imaging, wet prep and urine pending have been ordered and pending. Disposition will be pending lab studies and reassessment. Will be signed out to the oncoming provider.           Jaci Carrel, [place] 12/21/12 [phone]

## 2012-12-20 NOTE — ED Notes (Signed)
Pt had another emesis episode, vomited up oral contrast

## 2012-12-20 NOTE — ED Notes (Signed)
Patient transported to CT 

## 2012-12-20 NOTE — Telephone Encounter (Signed)
Patient called the emergency line for pelvic pain and elevated sugars.  She states that she had called the office earlier on Tuesday due to her sugar being elevated and feeling flushed.  She states that since then, she has been getting worse.  Her sugar remains elevated at 361, and she has developed worsening pelvic pain that is preventing her from walking.  She had some of this pain earlier, but it has gotten significantly worse.  Reports a normal BM today and denies dysuria.  A tramadol did not help with the pain.  I recommended that she go to the ED to be evaluated. She expressed understanding and agreement.

## 2012-12-20 NOTE — ED Notes (Signed)
IV team at bedside 

## 2012-12-20 NOTE — ED Notes (Addendum)
Per EMS, since approx 1pm today pt complaining of pain from abdomen, but now hurts in genitalia. Pt denies bleeding, but has been having irregular menstrual cycle. Gravida 2, Para 1. Pt denies discharge. Pt states that pain keeps getting worse, PCP recommended her to come to ED. Vitals stable. CBG 385. Bp 150/100, Hr 96, 99% RA.

## 2012-12-20 NOTE — ED Notes (Signed)
IV team paged.  

## 2012-12-20 NOTE — ED Notes (Signed)
Pt states the pain feels like contractions

## 2012-12-21 NOTE — ED Provider Notes (Signed)
Medical screening examination/treatment/procedure(s) were performed by non-physician practitioner and as supervising physician I was immediately available for consultation/collaboration.  Ronel Rodeheaver K Levi Klaiber, MD 12/21/12 0243 

## 2012-12-21 NOTE — ED Provider Notes (Signed)
Medical screening examination/treatment/procedure(s) were performed by non-physician practitioner and as supervising physician I was immediately available for consultation/collaboration.  Parish Dubose K Ciclaly Mulcahey, MD 12/21/12 0711 

## 2012-12-22 ENCOUNTER — Other Ambulatory Visit: Payer: Self-pay | Admitting: Family Medicine

## 2013-01-09 IMAGING — CT CT ABD-PELV W/ CM
2 of 4 series · 12 of 32 positions shown, 17 images · IV contrast (omnipaque)
Comparison: CT of the abdomen and pelvis performed 09/07/2004, and
prior ultrasound of pregnancy performed 04/05/2006

CLINICAL DATA: Panniculitis and lower abdominal wall cellulitis,
with abdominal pain, nausea and vomiting; assess for abscess.

CT ABDOMEN AND PELVIS WITH CONTRAST
TECHNIQUE: Multidetector CT imaging of the abdomen and pelvis was
performed following the standard protocol during bolus
administration of intravenous contrast.
Contrast: 40mL OMNIPAQUE IOHEXOL 300 MG/ML IV SOLN, 100mL OMNIPAQUE
IOHEXOL 300 MG/ML IV SOLN

[Series 2: abd,pelvis · axial · 0.97mm/px · z∈[-567,-192]mm · 4 of 125 slices shown, 9 images]
[im 25/125  soft-tissue]
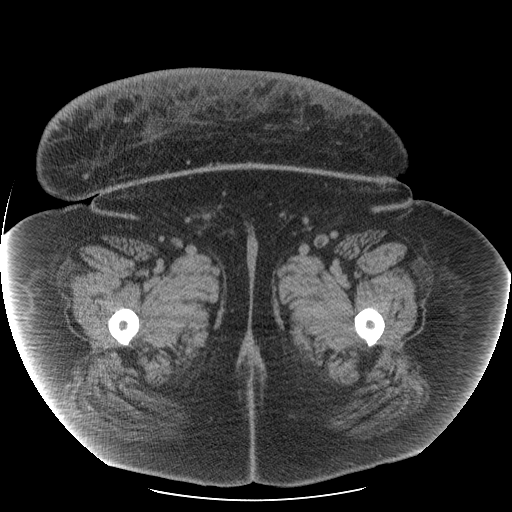
[im 25/125  lung]
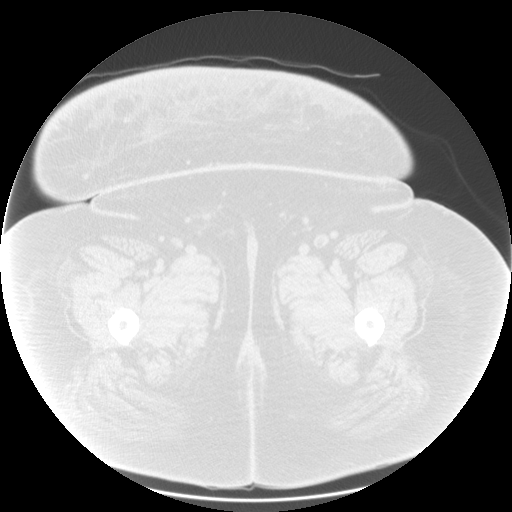
[im 25/125  bone]
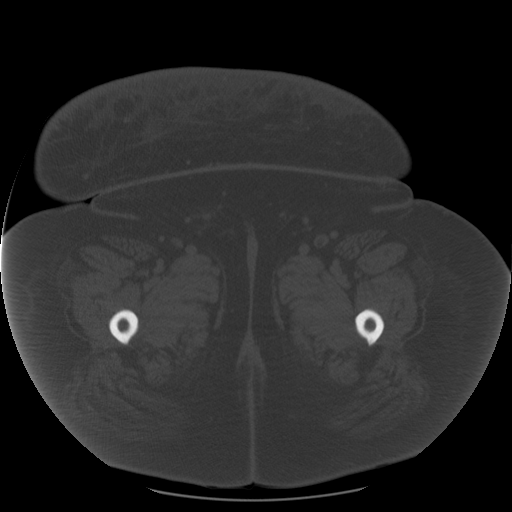
[im 50/125  soft-tissue]
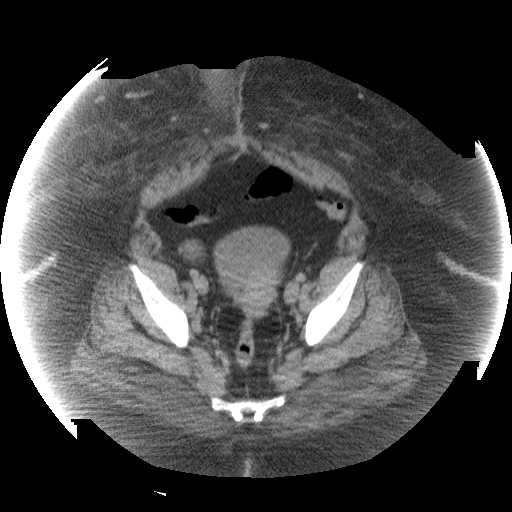
[im 50/125  lung]
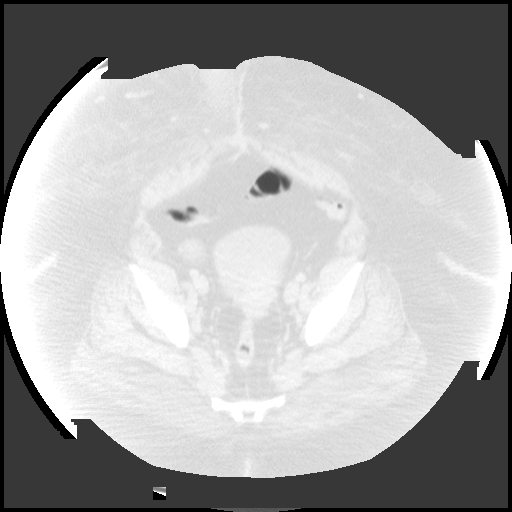
[im 75/125  soft-tissue]
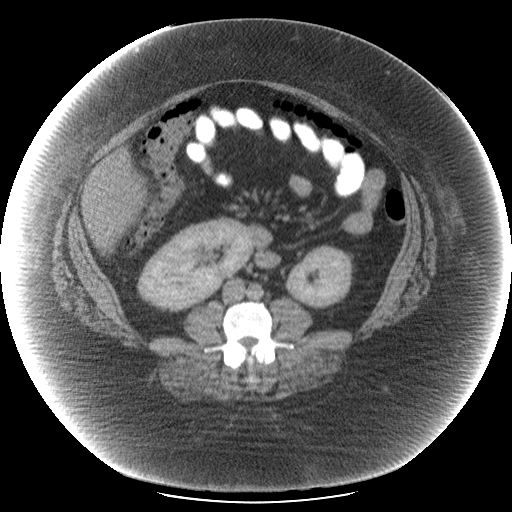
[im 75/125  lung]
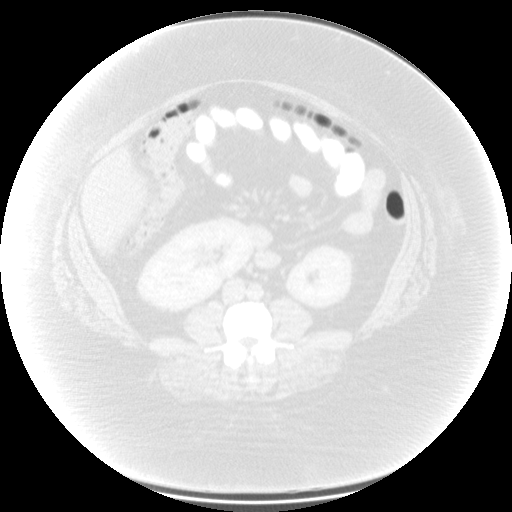
[im 100/125  soft-tissue]
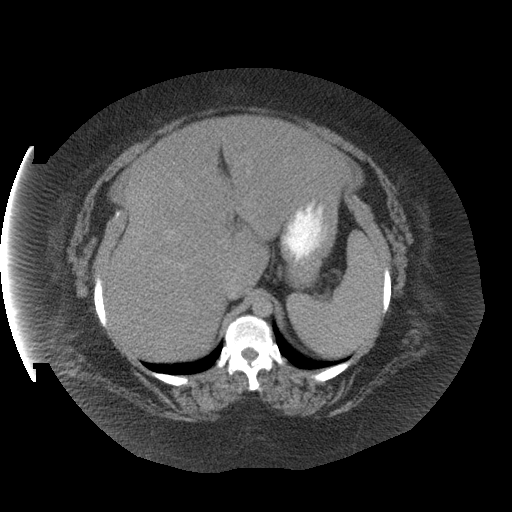
[im 100/125  lung]
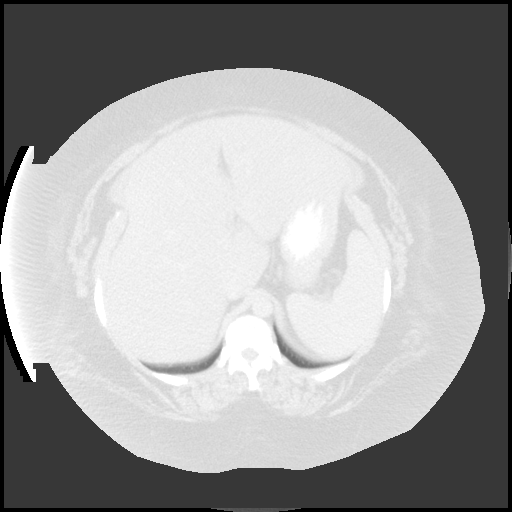

[Series 104: sag · sagittal · 1.24mm/px · 8 of 250 slices shown]
[im 23/250  soft-tissue]
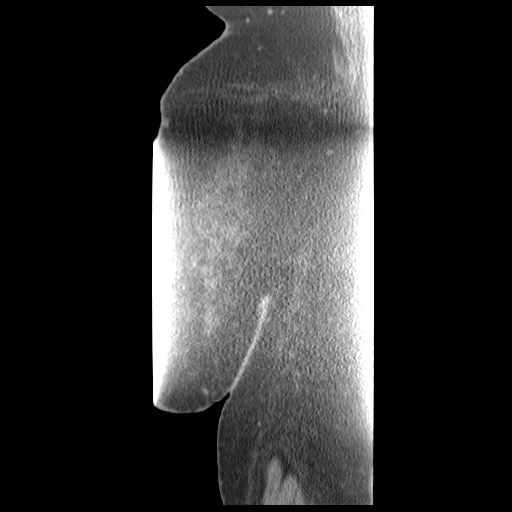
[im 46/250  soft-tissue]
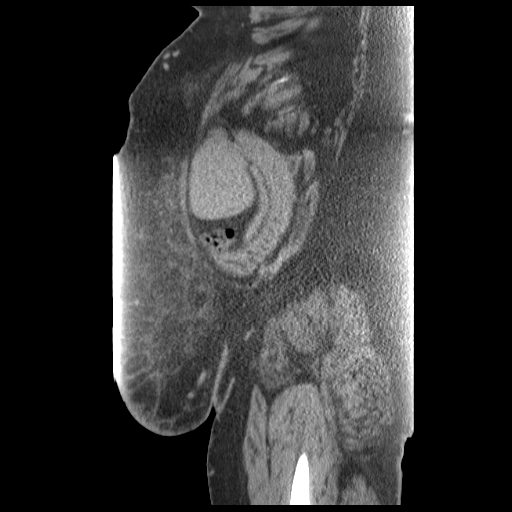
[im 91/250  soft-tissue]
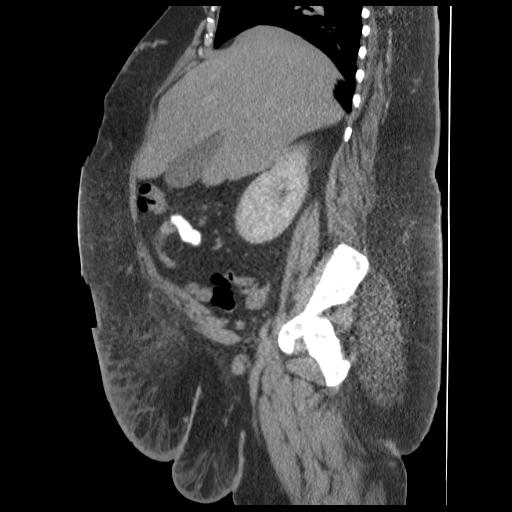
[im 114/250  soft-tissue]
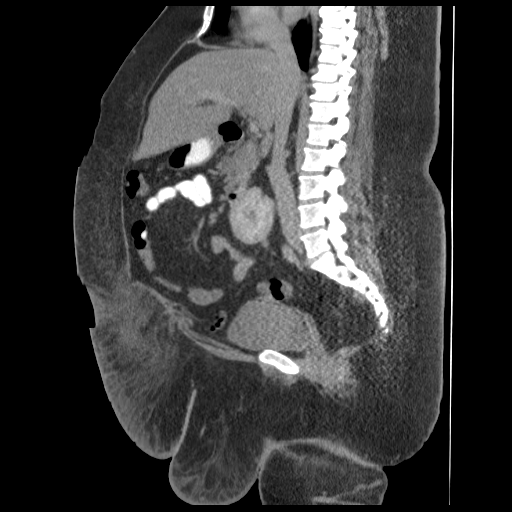
[im 136/250  soft-tissue]
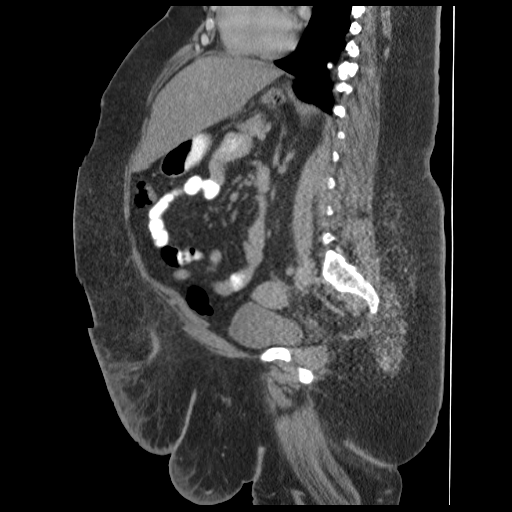
[im 159/250  soft-tissue]
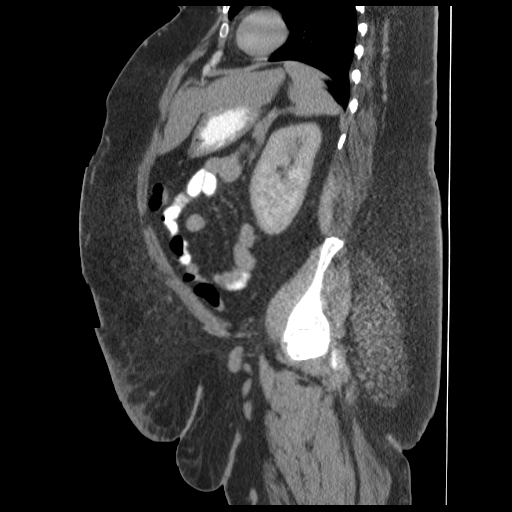
[im 204/250  soft-tissue]
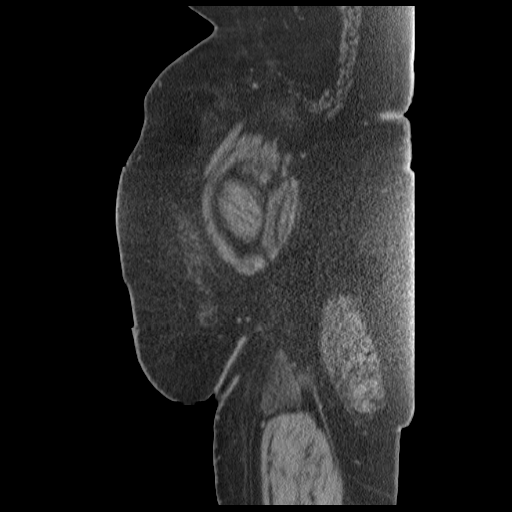
[im 227/250  soft-tissue]
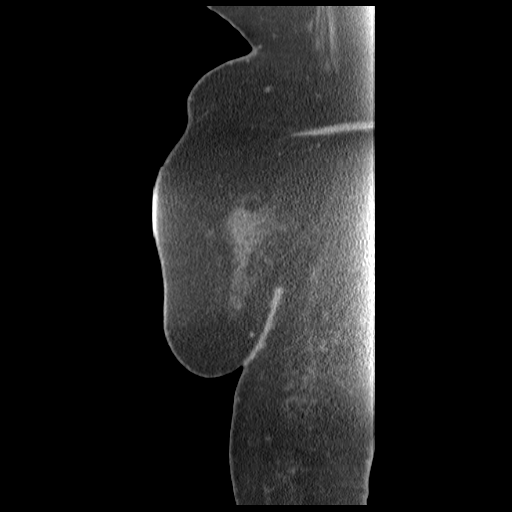

[12 of 32 positions shown; findings below may reference images not displayed]

FINDINGS: The visualized lung bases are clear.

The liver and spleen are unremarkable in appearance.  The
gallbladder is within normal limits.  The pancreas and left adrenal
gland are unremarkable.  There is a stable 2.8 cm right adrenal
lesion; given stability from 0221, this most likely reflects an
adrenal adenoma.

There is incomplete rotation of the right kidney.  The kidneys are
otherwise unremarkable in appearance.  Minimal nonspecific
perinephric stranding is noted bilaterally.  There is no evidence
of hydronephrosis.  No renal or ureteral stones are seen.

No free fluid is identified.  The small bowel is unremarkable in
appearance.  The stomach is within normal limits.  No acute
vascular abnormalities are seen.

The appendix is normal in caliber, without evidence for
appendicitis.  The colon is largely decompressed and is
unremarkable in appearance.

Note is made of diffuse soft tissue edema along the patient's
pannus, with associated skin thickening. No focal fluid collections
are seen to suggest abscess.  Relatively deep soft tissue
involvement is noted, extending superiorly about the umbilicus.
Associated mildly prominent vasculature is noted.

The bladder is mildly distended and grossly unremarkable in
appearance.  The uterus is within normal limits.  The ovaries are
relatively symmetric; no suspicious adnexal masses are seen.  No
inguinal lymphadenopathy is seen.

No acute osseous abnormalities are identified.
IMPRESSION: 1.  No evidence of abscess.
2.  Diffuse soft tissue edema along the patient's pannus, with
associated skin thickening, compatible with panniculitis.
3.  Stable right adrenal adenoma again noted.

## 2013-01-16 ENCOUNTER — Other Ambulatory Visit: Payer: Self-pay | Admitting: Family Medicine

## 2013-01-20 ENCOUNTER — Other Ambulatory Visit: Payer: Self-pay | Admitting: Family Medicine

## 2013-01-22 ENCOUNTER — Other Ambulatory Visit: Payer: Self-pay | Admitting: *Deleted

## 2013-01-22 MED ORDER — TRAMADOL HCL 50 MG PO TABS: 50.0000 mg | ORAL_TABLET | Freq: Four times a day (QID) | ORAL | Status: DC | PRN

## 2013-01-31 ENCOUNTER — Ambulatory Visit (INDEPENDENT_AMBULATORY_CARE_PROVIDER_SITE_OTHER): Payer: Medicaid Other | Admitting: Family Medicine

## 2013-01-31 ENCOUNTER — Encounter: Payer: Self-pay | Admitting: Family Medicine

## 2013-01-31 DIAGNOSIS — IMO0001 Reserved for inherently not codable concepts without codable children: Secondary | ICD-10-CM

## 2013-01-31 DIAGNOSIS — M545 Low back pain, unspecified: Secondary | ICD-10-CM

## 2013-01-31 DIAGNOSIS — E1165 Type 2 diabetes mellitus with hyperglycemia: Secondary | ICD-10-CM

## 2013-01-31 DIAGNOSIS — IMO0002 Reserved for concepts with insufficient information to code with codable children: Secondary | ICD-10-CM

## 2013-01-31 MED ORDER — INSULIN ASPART 100 UNIT/ML ~~LOC~~ SOLN: 50.0000 [IU] | Freq: Three times a day (TID) | SUBCUTANEOUS | Status: DC

## 2013-01-31 MED ORDER — HYDROCODONE-ACETAMINOPHEN 5-325 MG PO TABS: 1.0000 | ORAL_TABLET | Freq: Four times a day (QID) | ORAL | Status: DC | PRN

## 2013-01-31 MED ORDER — INSULIN GLARGINE 100 UNIT/ML ~~LOC~~ SOLN: 115.0000 [IU] | Freq: Two times a day (BID) | SUBCUTANEOUS | Status: DC

## 2013-01-31 MED ORDER — HYDROCODONE-ACETAMINOPHEN 10-325 MG PO TABS: 1.0000 | ORAL_TABLET | Freq: Three times a day (TID) | ORAL | Status: DC | PRN

## 2013-01-31 NOTE — Assessment & Plan Note (Signed)
It is stressed to the patient that she needs to continue to exercise in the form of walking daily and she needs to continue losing weight as it will help reduce long term sequelae of venous stasis in her lower extremities which is a concern of hers and also help with her low back pain and chronic knee pain. There was a lengthy discussion of the long-term quality of life outcomes associated with losing weight versus not losing weight. Pt seems motivated.

## 2013-01-31 NOTE — Progress Notes (Signed)
Subjective:    Patient ID: [name], female    DOB: 1976/08/08, 37 y.o.   MRN: [id number]  HPI The patient is a 37 yo F who presents for medication refill and to discuss low back pain.  #. Lower Back Pain The patient has been experiencing some lumbar back pain which has been going on for about a month. She denies any traumatic event or point of injury and notes the pain is a 9/10 at its worst and a 7/10 at its best with no exacerbating factors with the only alleviating factor being rest and her pain medication. Her tenderness is not midline and is low lumbar region without radiation or neurological symptoms. She has Norco 10-325 which she uses for chronic knee pain and is given 45 tabs/month, she has been using this pain med for her low back pain and is now running low on her pain medication. She has Tramadol 50 mg tabs at home which she only uses when she runs out of the Norco and has some Voltaren 1% cream at home she doesn't use much because she doesn't think it works. She takes Flexeril currently 10 mg qhs.  #. DM ll The patient's last HbA1C was 11/13/2012 and was >14. On last encounter we increased her Lantus to 115 U BID from 105 U and added Metformin back at 500 mg BID. She has not had any abdominal cramping since medication start. She says she has been checking her glucose 4-5 x's daily and her fasting levels have been 80-100 and post prandial have been 150-200. She denies s/s of having low glucose.   #. Obesity The patient is on disability and has been making an attempt to improve her diet since last encounter. Today she is 341 lbs and was 342 last visit (11/13/2012) but is down 40 lbs since 07/18/2012. She said she has been substituting [place] for beef and increasing her vegetable intake while trying to cut back on fatty foods and fast food intake.  She continues to walk for activity.  She is using hydrocodone to increase her tolerability to walk.  #. Recent hospital visit The patient  went to the emergency department 12/20/2012 for lower diffuse abd/pelvic pain. 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 She was subsequently discharged and notes the pain only returned last Thursday briefly but is not there currently.  #. General The patient reports she is under a lot of financial stress recently. She admits to occasional short lived frontal headaches in the morning without associated N/V but does have some blurry vision association. She has been sleeping well and uses 5 mg Ambien qhs and notes she does not feel groggy in the next morning. She denies SOB, CP, vomiting, diarrhea, constipation (has BM daily).    History reviewed and updated as needed in EMR.  Smoking history reviewed and negative.   Review of Systems 12 point ROS negative except for as above in HPI.    Objective:   Physical Exam General: In no acute distress, obese, pleasant. Chest: RRR, no murmurs, rubs, gallops notable, habitus limited. Respiratory: Clear breath sounds bilaterally throughout lung field with decreased sounds due to habitus. Abdomen: + BS, soft, non-tender, obese. Back: Lumbar pain, L1, L2 and L2 locations paraspinal muscular with mild palpation. Some spasm present.  No midline tenderness or radiation of pain with limited leg mobility on exam. Extremities: Some chronic venous  stasis changes to lower extremities with very mild notices on R leg and 2 areas anterior L shin with chronic changes with increased pigmentation, no ulcers or infectious signs. 2 + pitting edema bilaterally lower extremities. 1+ pedal pulses bilaterally.     Assessment & Plan:   #. Low Back pain Norco 10-325 refilled. She use to have 60 pills/month and was reduced to 45 pills/month and with new onset back pain has not adjusted to decreased dose. She may take her Flexeril BID with a dose in the morning on top of her dose qhs  she has been taking for 7 days. It was discussed with the patient to not drive if she decides to take the Flexeril in the morning. If pain does not resolve or get better within a month we may consider physical therapy for paraspinal muscle strengthening.  #. DM ll The patient's Novolog was increased to 50 U per meal from previous 45 U/meal. Continue 115 U Lantus BID. Con't metformin 500 mg BID currently.  Last A1C 11/13/2013 >14. Given refill for Lantus and Novolog. Follow up in one month.   #. Obesity It is stressed to the patient that she needs to continue to exercise in the form of walking daily and she needs to continue losing weight as it will help reduce long term sequelae of venous stasis in her lower extremities which is a concern of hers and also help with her low back pain and chronic knee pain. There was a lengthy discussion of the long-term quality of life outcomes associated with losing weight versus not losing weight. Pt seems motivated.

## 2013-01-31 NOTE — Patient Instructions (Signed)
Thank you for coming in today, it was good to see you Increase your novolog to 50 units with each meal. Keep walking and try using flexeril in the morning to help with your back I will see you back in 1 month.

## 2013-01-31 NOTE — Assessment & Plan Note (Signed)
>>  ASSESSMENT AND PLAN FOR INSULIN DEPENDENT TYPE 2 DIABETES MELLITUS ([name]) WRITTEN ON 01/31/2013  9:19 PM BY MATTHEWS, CODY  The patient's Novolog was increased to 50 U per meal from previous 45 U/meal. Continue 115 U Lantus BID. Con't metformin 500 mg BID currently.  Last A1C 11/13/2013 >14. Given refill for Lantus and Novolog. Follow up in one month.

## 2013-01-31 NOTE — Assessment & Plan Note (Signed)
Norco 10-325 refilled. She use to have 60 pills/month and was reduced to 45 pills/month and with new onset back pain has not adjusted to decreased dose. She may take her Flexeril BID with a dose in the morning on top of her dose qhs she has been taking for 7 days. It was discussed with the patient to not drive if she decides to take the Flexeril in the morning. If pain does not resolve or get better within a month we may consider physical therapy for paraspinal muscle strengthening.

## 2013-01-31 NOTE — Assessment & Plan Note (Signed)
The patient's Novolog was increased to 50 U per meal from previous 45 U/meal. Continue 115 U Lantus BID. Con't metformin 500 mg BID currently.  Last A1C 11/13/2013 >14. Given refill for Lantus and Novolog. Follow up in one month.

## 2013-02-16 ENCOUNTER — Other Ambulatory Visit: Payer: Self-pay | Admitting: Family Medicine

## 2013-02-21 ENCOUNTER — Encounter: Payer: Self-pay | Admitting: *Deleted

## 2013-02-28 ENCOUNTER — Ambulatory Visit (INDEPENDENT_AMBULATORY_CARE_PROVIDER_SITE_OTHER): Payer: Medicaid Other | Admitting: Family Medicine

## 2013-02-28 ENCOUNTER — Encounter: Payer: Self-pay | Admitting: Family Medicine

## 2013-02-28 VITALS — BP 155/92 | HR 113 | Ht 62.0 in | Wt 342.0 lb

## 2013-02-28 DIAGNOSIS — I10 Essential (primary) hypertension: Secondary | ICD-10-CM

## 2013-02-28 DIAGNOSIS — M549 Dorsalgia, unspecified: Secondary | ICD-10-CM

## 2013-02-28 DIAGNOSIS — IMO0002 Reserved for concepts with insufficient information to code with codable children: Secondary | ICD-10-CM

## 2013-02-28 DIAGNOSIS — M545 Low back pain, unspecified: Secondary | ICD-10-CM

## 2013-02-28 DIAGNOSIS — E1165 Type 2 diabetes mellitus with hyperglycemia: Secondary | ICD-10-CM

## 2013-02-28 DIAGNOSIS — IMO0001 Reserved for inherently not codable concepts without codable children: Secondary | ICD-10-CM

## 2013-02-28 MED ORDER — CARVEDILOL 12.5 MG PO TABS: 12.5000 mg | ORAL_TABLET | Freq: Two times a day (BID) | ORAL | Status: DC

## 2013-02-28 MED ORDER — KETOROLAC TROMETHAMINE 60 MG/2ML IJ SOLN
60.0000 mg | Freq: Once | INTRAMUSCULAR | Status: AC
  Administered 2013-02-28: 60 mg via INTRAMUSCULAR

## 2013-02-28 NOTE — Patient Instructions (Addendum)
General Instructions: Make and appointment with pharmacy clinic before you leave today Keep walking daily I am increasing your coreg, I have sent a new prescription to the pharmacy Our health coach will be giving you a call to be sure you are keeping up with your sugars each week Follow up with me in one month, bring a log of your medications.     Treatment Goals:  Goals (1 Years of Data) as of 02/28/13         As of Today 01/31/13 12/20/12 11/13/12 08/25/12     Blood Pressure    . Blood Pressure < 130/80  155/92 152/102 150/73 124/72 145/97     Exercise    . Exercise 5x per week and 20 mins a day   Yes        Result Component    . HEMOGLOBIN A1C < 8.0  >14.0   >14.0       Progress Toward Treatment Goals:  Treatment Goal 02/28/2013  Hemoglobin A1C unchanged  Blood pressure unchanged    Self Care Goals & Plans:  Self Care Goal 02/28/2013  Manage my medications take my medicines as prescribed; bring my medications to every visit  Monitor my health keep track of my blood glucose; bring my glucose meter and log to each visit  Eat healthy foods eat more vegetables; eat smaller portions  Be physically active take a walk every day    Home Blood Glucose Monitoring 02/28/2013  Check my blood sugar 4 times a day  When to check my blood sugar before breakfast; after breakfast; after lunch; after dinner     Care Management & Community Referrals:  Referral 02/28/2013  Referrals made for care management support health educator; pharmacy clinic

## 2013-03-04 NOTE — Assessment & Plan Note (Signed)
BP elevated today along with HR.  Will increase carvedilol.  Encouraged continued weight loss and exercise.  Will have health coach call as well.

## 2013-03-04 NOTE — Assessment & Plan Note (Signed)
>>  ASSESSMENT AND PLAN FOR INSULIN DEPENDENT TYPE 2 DIABETES MELLITUS ([name]) WRITTEN ON 03/04/2013 10:39 PM BY MATTHEWS, CODY  Still very poorly controlled based on A1c.  Her glucose she reports that she is getting at home seems to be inconsistent with what her A1c has been.  Asked her to write down glucose or bring in meter during next visit and make appt. With pharmacy clinic.  Will also refer to health coach.

## 2013-03-04 NOTE — Progress Notes (Signed)
  Subjective:    Patient ID: [name], female    DOB: 04-06-1976, 37 y.o.   MRN: [id number]  Diabetes  Back Pain    1. CHRONIC DIABETES  Disease Monitoring  Blood Sugar Ranges: Reports glucose <100 fasting, 150-180 post prandial  Polyuria: no   Visual problems: no   Medication Compliance: yes  Medication Side Effects  Hypoglycemia: no   Preventitive Health Care  Diet pattern: Reports increasing vegetable intake and lowering carb intake  Exercise: Reports walking 75min/day, otherwise pretty sedentary.  2. Back pain:  Reports back pain in lower back while trying to get out of the shower last week.  Has been using flexeril and tramadol which has not helped much, although not using very regularly.  She denies radiation of pain down the leg, bowel or bladder dysfunction.  This is different that her chronic back pain that she has.   3. CHRONIC HYPERTENSION  Disease Monitoring  Blood pressure range: DOes not monitor at home  Chest pain: no   Dyspnea: no   Claudication: no   Medication compliance: yes  Medication Side Effects  Lightheadedness: no   Urinary frequency: no   Edema: no      Review of Systems  Musculoskeletal: Positive for back pain.   Per HPI    Objective:   Physical Exam  Constitutional:  Morbidly obese female, nad   Cardiovascular: Normal rate and regular rhythm.   Pulmonary/Chest: Effort normal and breath sounds normal.  Musculoskeletal: She exhibits no edema.  Low back with no tenderness to palpation.  ROM limited by pain and habitus. SLR negative.          Assessment & Plan:

## 2013-03-04 NOTE — Assessment & Plan Note (Signed)
Most recent episode sounds like strain with spasm.  Continue flexeril and exercise.  Encouraged weight loss.  Given toradol in clinic.

## 2013-03-04 NOTE — Assessment & Plan Note (Signed)
Still very poorly controlled based on A1c.  Her glucose she reports that she is getting at home seems to be inconsistent with what her A1c has been.  Asked her to write down glucose or bring in meter during next visit and make appt. With pharmacy clinic.  Will also refer to health coach.

## 2013-03-12 ENCOUNTER — Other Ambulatory Visit: Payer: Self-pay | Admitting: Family Medicine

## 2013-03-16 ENCOUNTER — Other Ambulatory Visit: Payer: Self-pay | Admitting: *Deleted

## 2013-03-20 MED ORDER — CYCLOBENZAPRINE HCL 10 MG PO TABS: ORAL_TABLET | ORAL | Status: DC

## 2013-03-20 MED ORDER — ALBUTEROL SULFATE HFA 108 (90 BASE) MCG/ACT IN AERS: 2.0000 | INHALATION_SPRAY | Freq: Four times a day (QID) | RESPIRATORY_TRACT | Status: DC | PRN

## 2013-03-20 MED ORDER — NITROGLYCERIN 0.4 MG SL SUBL: 0.4000 mg | SUBLINGUAL_TABLET | SUBLINGUAL | Status: DC | PRN

## 2013-03-22 ENCOUNTER — Ambulatory Visit: Payer: Medicaid Other | Admitting: Pharmacist

## 2013-04-02 ENCOUNTER — Ambulatory Visit: Payer: Medicaid Other | Admitting: Family Medicine

## 2013-04-10 ENCOUNTER — Other Ambulatory Visit: Payer: Self-pay | Admitting: *Deleted

## 2013-04-20 ENCOUNTER — Ambulatory Visit (INDEPENDENT_AMBULATORY_CARE_PROVIDER_SITE_OTHER): Payer: Medicaid Other | Admitting: Family Medicine

## 2013-04-20 ENCOUNTER — Encounter: Payer: Self-pay | Admitting: Family Medicine

## 2013-04-20 VITALS — BP 135/87 | HR 99 | Ht 62.0 in | Wt 330.0 lb

## 2013-04-20 DIAGNOSIS — I10 Essential (primary) hypertension: Secondary | ICD-10-CM

## 2013-04-20 DIAGNOSIS — IMO0002 Reserved for concepts with insufficient information to code with codable children: Secondary | ICD-10-CM

## 2013-04-20 DIAGNOSIS — IMO0001 Reserved for inherently not codable concepts without codable children: Secondary | ICD-10-CM

## 2013-04-20 DIAGNOSIS — E1165 Type 2 diabetes mellitus with hyperglycemia: Secondary | ICD-10-CM

## 2013-04-20 MED ORDER — HYDROCODONE-ACETAMINOPHEN 10-325 MG PO TABS: 1.0000 | ORAL_TABLET | Freq: Three times a day (TID) | ORAL | Status: DC | PRN

## 2013-04-20 NOTE — Patient Instructions (Signed)
Thank you for coming in today, it was good to see you [place] Job with walking and weight loss, keep it up!! Reschedule with Dr. Raymondo Band Follow up 1-2 months to recheck your A1c

## 2013-04-22 NOTE — Progress Notes (Signed)
  Subjective:    Patient ID: [name], female    DOB: 1976/02/25, 37 y.o.   MRN: [id number]  Hypertension  Diabetes    1. DM: CHRONIC DIABETES  Disease Monitoring  Blood Sugar Ranges: 90-100 fasting, 170 post prandial.    Polyuria: no   Visual problems: no   Medication Compliance: yes  Medication Side Effects  Hypoglycemia: no   Preventitive Health Care  Eye Exam: Due  Foot Exam: Done today  Diet pattern: Improving.  She has decreaseed fried foods and fast foods.  She is baking and boiling more foods.    Exercise: She is walking 5 days/week, each day.      2. HTN: CHRONIC HYPERTENSION  Disease Monitoring  Blood pressure range: No self monitoring at home  Chest pain: no   Dyspnea: no   Claudication: no   Medication compliance: yes  Medication Side Effects  Lightheadedness: no   Urinary frequency: no   Edema: yes, trace     3. Obesity:  Walking 5 days per week.  Does not check weights at home.  She continues to use hydrocodone for pain control in her knees for walking.  Making dietary changes as noted above.   Past Medical History  Diagnosis Date  . Morbid obesity   . Alveolar hypoventilation   . Asthma   . GERD (gastroesophageal reflux disease)   . Rectal fissure   . Hypertension   . Costochondritis   . Cellulitis 08/2010-08/2011  . Depression   . Bipolar 2 disorder   . Obstructive sleep apnea   . Chest pain     NO CAD by cath 07/27/12, nl LV systolic fxn  . Diabetes mellitus 2000    Type 2, Uncontrolled  . Arthritis   . HLD (hyperlipidemia)     Review of Systems Per HPI    Objective:   Physical Exam  Constitutional:  Morbidly obese female, nad   Cardiovascular: Normal rate and regular rhythm.   Pulmonary/Chest: Effort normal and breath sounds normal.  Musculoskeletal: She exhibits no edema.  Neurological: She is alert.          Assessment & Plan:

## 2013-04-22 NOTE — Assessment & Plan Note (Signed)
>>  ASSESSMENT AND PLAN FOR INSULIN DEPENDENT TYPE 2 DIABETES MELLITUS ([name]) WRITTEN ON 04/22/2013  6:33 PM BY MATTHEWS, CODY  Reported glucose is much improved.  No changes to insulin currently, will see if her future A1c reflects the numbers she reports she is getting..  She plans to reschedule with Dr. Raymondo Band.

## 2013-04-22 NOTE — Assessment & Plan Note (Signed)
BP looks ok today, continue current medications.  Labs up to date and on chart from January.

## 2013-04-22 NOTE — Assessment & Plan Note (Signed)
Doing better with getting regular exercise.  WIll continue hydrocodone for pain control as she is doing well with exercise and has had significant weight loss since last appointment.  Congratulated on weight loss, will follow up at next appointment.

## 2013-04-22 NOTE — Assessment & Plan Note (Signed)
Reported glucose is much improved.  No changes to insulin currently, will see if her future A1c reflects the numbers she reports she is getting..  She plans to reschedule with Dr. Raymondo Band.

## 2013-04-26 IMAGING — CR DG CHEST 2V
2 series · 2 of 2 positions shown · non-contrast
Comparison: 05/28/2011, 07/01/2010 and 04/01/2009.

CLINICAL DATA: Chest pain for 2 weeks worsening.  Asthma.  High
blood pressure.  Diabetes.

CHEST - 2 VIEW

[w chest pa]
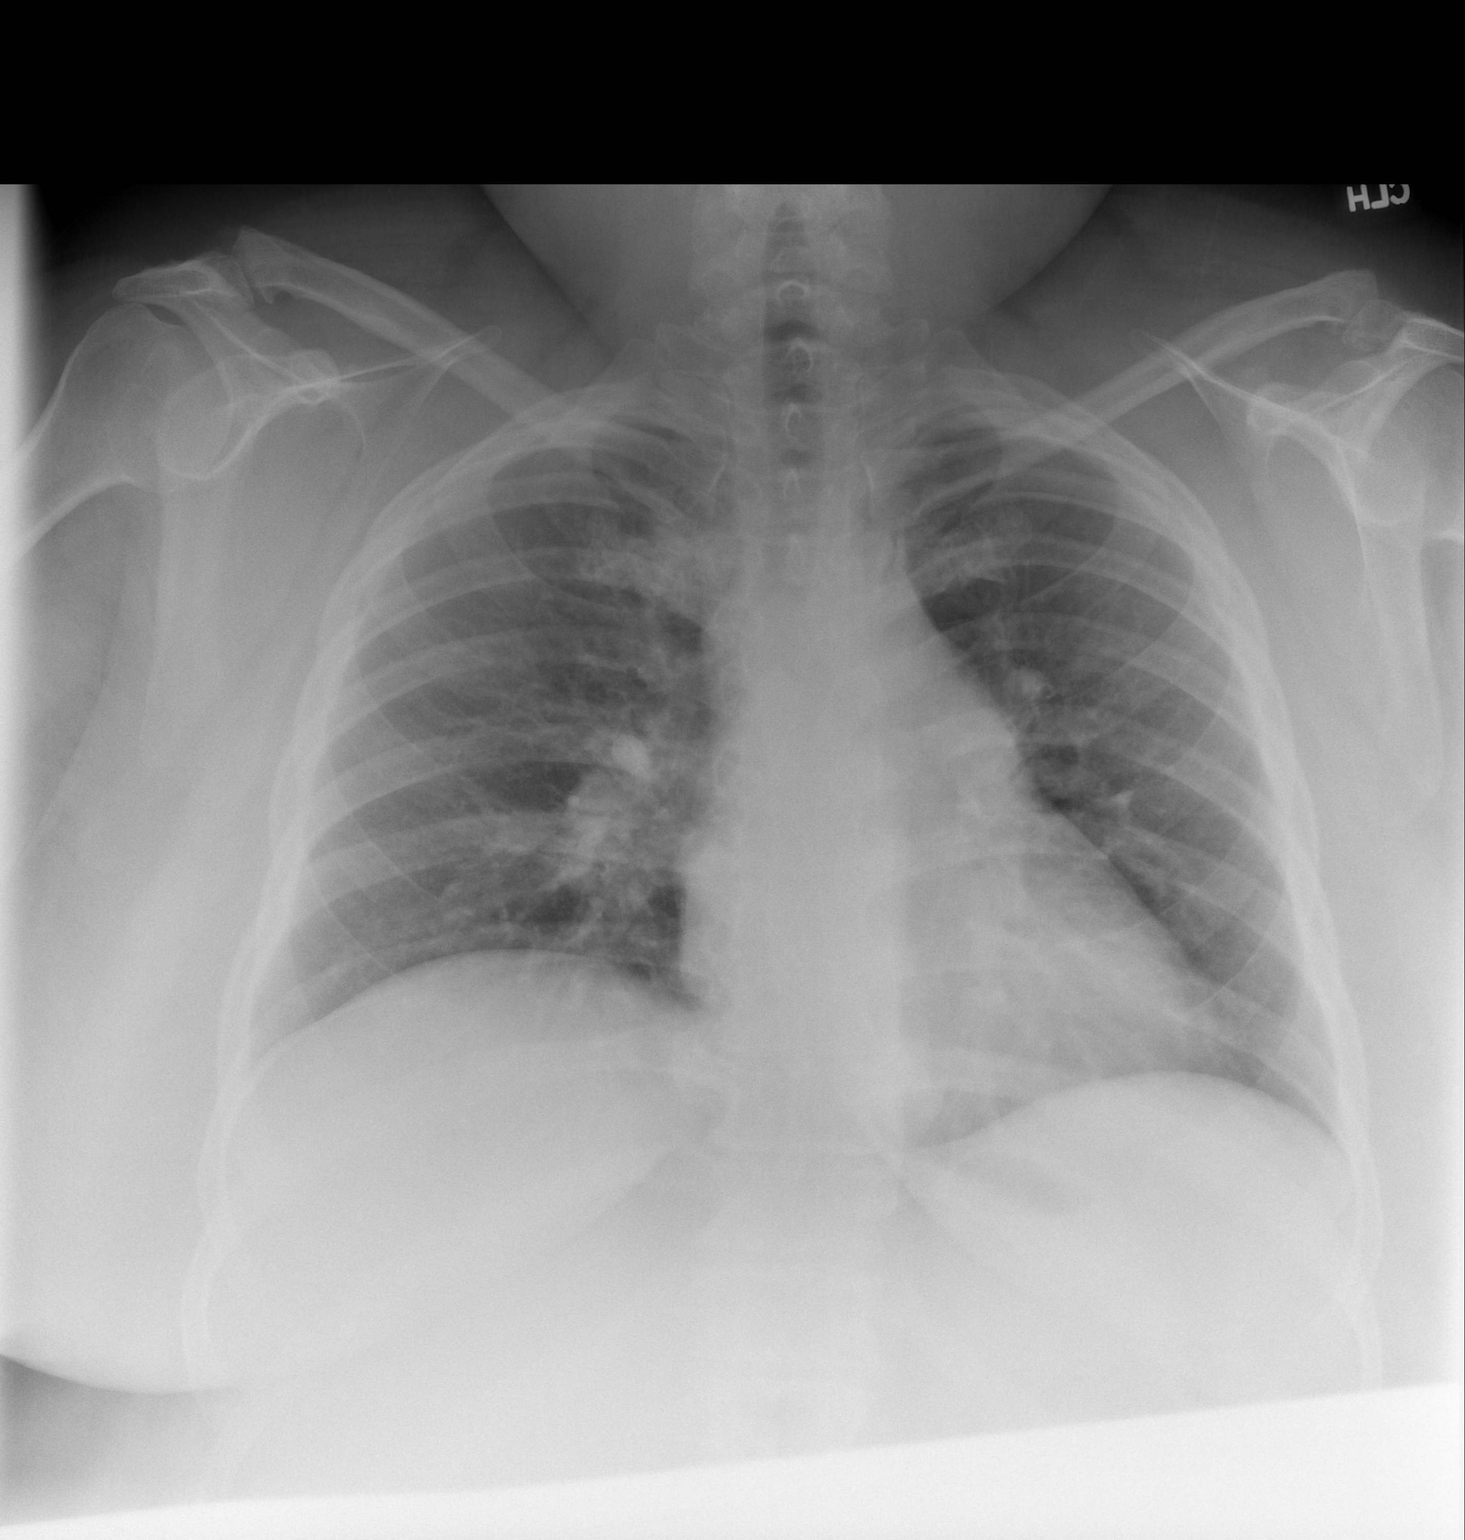

[w chest lat *]
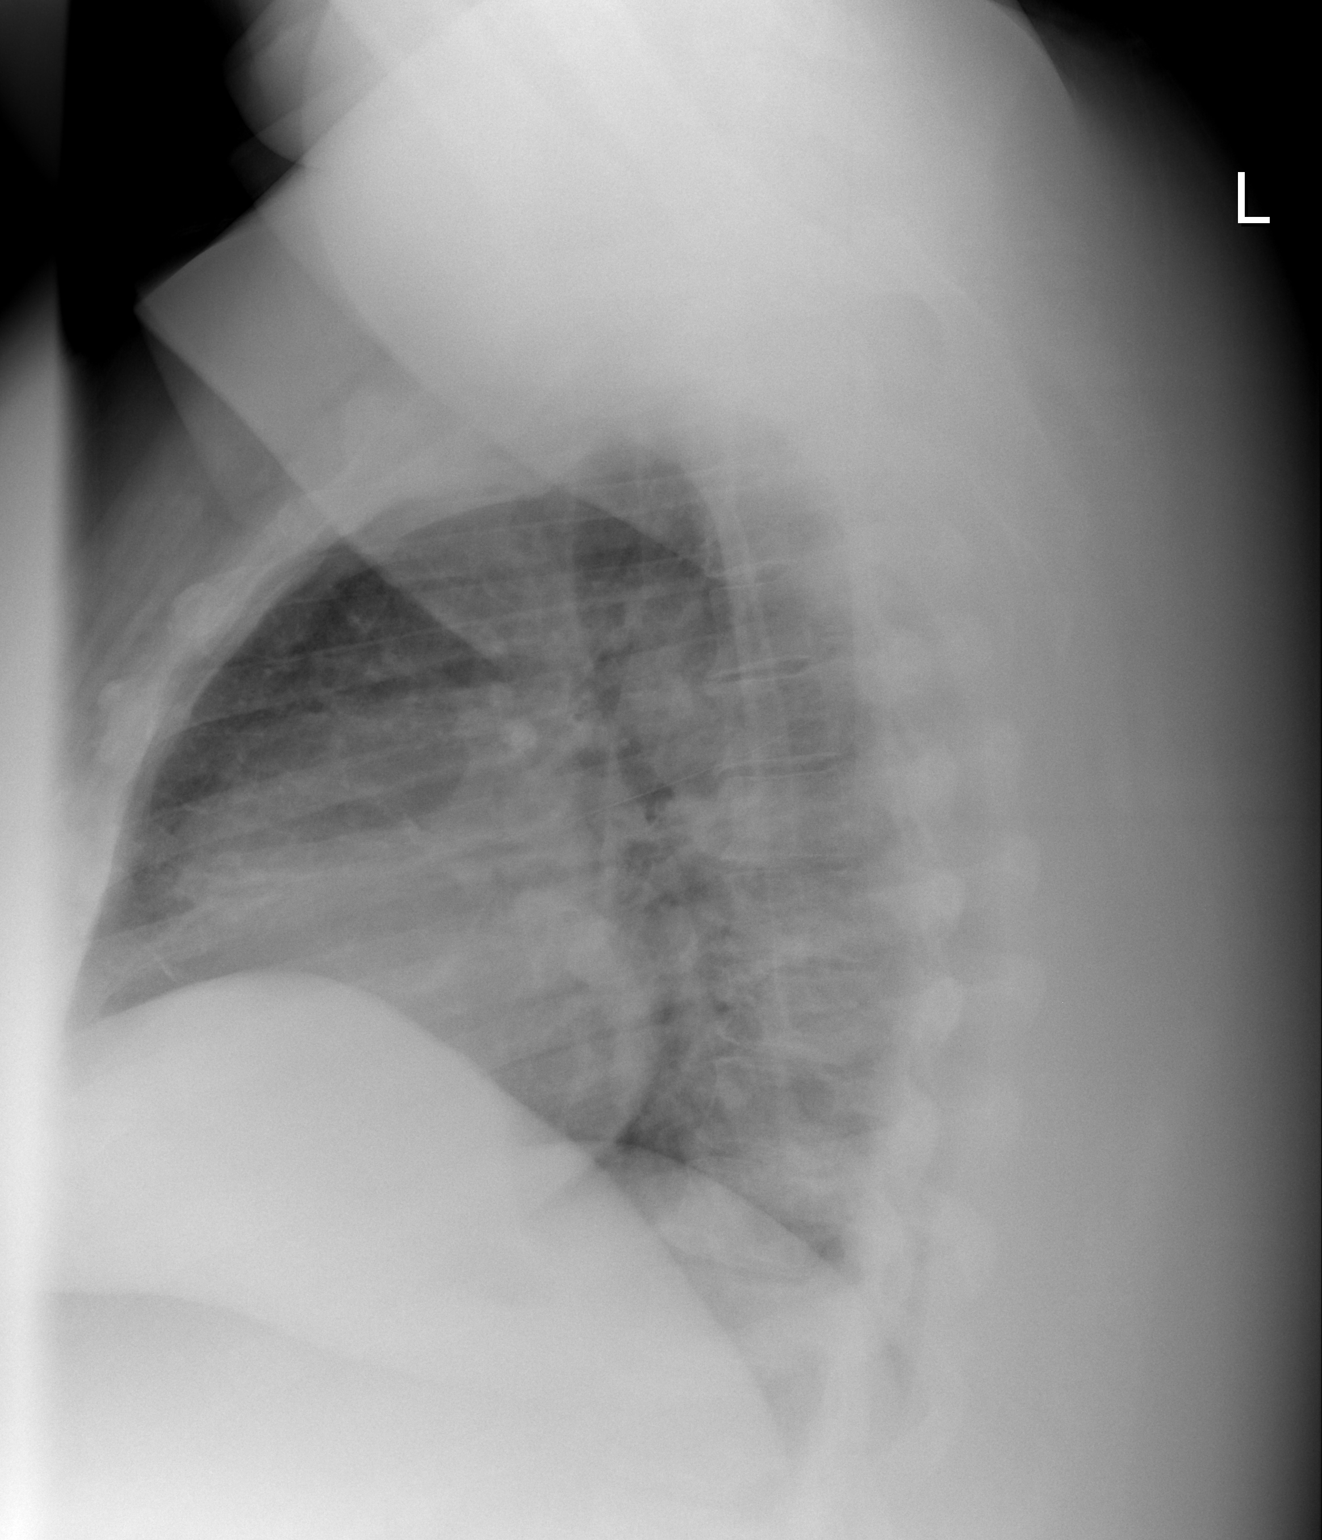

[2 of 2 positions shown; findings below may reference images not displayed]

FINDINGS: Prominence of the right hilum unchanged appearing
vascular origin.  This limits detection of underlying mass or
adenopathy.

Poor inspiration with central pulmonary vascular prominence without
segmental infiltrate or pneumothorax.

Heart size within normal limits.
IMPRESSION: Pulmonary vascular prominence most notable centrally as detailed
above.

No segmental infiltrate.

## 2013-05-02 ENCOUNTER — Encounter (HOSPITAL_COMMUNITY): Payer: Self-pay | Admitting: Emergency Medicine

## 2013-05-02 ENCOUNTER — Emergency Department (HOSPITAL_COMMUNITY)
Admission: EM | Admit: 2013-05-02 | Discharge: 2013-05-03 | Disposition: A | Discharge status: Home / Self Care | Payer: Medicaid Other | Attending: Emergency Medicine | Admitting: Emergency Medicine

## 2013-05-02 DIAGNOSIS — Z8659 Personal history of other mental and behavioral disorders: Secondary | ICD-10-CM | POA: Insufficient documentation

## 2013-05-02 DIAGNOSIS — K219 Gastro-esophageal reflux disease without esophagitis: Secondary | ICD-10-CM | POA: Insufficient documentation

## 2013-05-02 DIAGNOSIS — J45909 Unspecified asthma, uncomplicated: Secondary | ICD-10-CM | POA: Insufficient documentation

## 2013-05-02 DIAGNOSIS — E119 Type 2 diabetes mellitus without complications: Secondary | ICD-10-CM | POA: Insufficient documentation

## 2013-05-02 DIAGNOSIS — Z87891 Personal history of nicotine dependence: Secondary | ICD-10-CM | POA: Insufficient documentation

## 2013-05-02 DIAGNOSIS — Z794 Long term (current) use of insulin: Secondary | ICD-10-CM | POA: Insufficient documentation

## 2013-05-02 DIAGNOSIS — L0211 Cutaneous abscess of neck: Secondary | ICD-10-CM

## 2013-05-02 DIAGNOSIS — Z79899 Other long term (current) drug therapy: Secondary | ICD-10-CM | POA: Insufficient documentation

## 2013-05-02 DIAGNOSIS — Z8739 Personal history of other diseases of the musculoskeletal system and connective tissue: Secondary | ICD-10-CM | POA: Insufficient documentation

## 2013-05-02 DIAGNOSIS — Z8679 Personal history of other diseases of the circulatory system: Secondary | ICD-10-CM | POA: Insufficient documentation

## 2013-05-02 DIAGNOSIS — Z8719 Personal history of other diseases of the digestive system: Secondary | ICD-10-CM | POA: Insufficient documentation

## 2013-05-02 DIAGNOSIS — Z7982 Long term (current) use of aspirin: Secondary | ICD-10-CM | POA: Insufficient documentation

## 2013-05-02 DIAGNOSIS — I10 Essential (primary) hypertension: Secondary | ICD-10-CM | POA: Insufficient documentation

## 2013-05-02 DIAGNOSIS — E785 Hyperlipidemia, unspecified: Secondary | ICD-10-CM | POA: Insufficient documentation

## 2013-05-02 NOTE — ED Notes (Signed)
PT. REPORTS ABSCESS AT BACK OF LOWER NECK ONSET 3 DAYS AGO , NO DRAINAGE OR REDNESS .

## 2013-05-03 ENCOUNTER — Ambulatory Visit: Payer: Medicaid Other | Admitting: Pharmacist

## 2013-05-03 MED ORDER — PERCOCET 5-325 MG PO TABS: 1.0000 | ORAL_TABLET | Freq: Four times a day (QID) | ORAL | Status: DC | PRN

## 2013-05-03 MED ORDER — OXYCODONE-ACETAMINOPHEN 5-325 MG PO TABS
1.0000 | ORAL_TABLET | Freq: Once | ORAL | Status: AC
  Administered 2013-05-03: 1 via ORAL
  Filled 2013-05-03: qty 1

## 2013-05-03 NOTE — ED Notes (Signed)
EDPA performed I&D procedure, pt tolerated well, c/o 9/10 pain at site at this time. Sterile bandage applied by EDPA, site is clean dry and intact

## 2013-05-03 NOTE — ED Notes (Signed)
Pt comfortable with d/c and f/u instructions. Prescriptions x1 

## 2013-05-03 NOTE — ED Provider Notes (Signed)
History     CSN: [id number]  Arrival date & time 05/02/13  2145   First MD Initiated Contact with Patient 05/03/13 0119      Chief Complaint  Patient presents with  . Abscess    (Consider location/radiation/quality/duration/timing/severity/associated sxs/prior treatment) HPI 37 year old female presents emergency department complaining of abscess on posterior neck.  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  Patient states that normally she goes to her primary care physician for the procedure to be performed, however she was unable to get an appointment.  Pain is rated at an 8/10 in severity.  Patient denies any purulent are [place] fevers.  Patient states that she gets abscess here because back "where my CPAP strap goes" no other this time.  Past Medical History  Diagnosis Date  . Morbid obesity   . Alveolar hypoventilation   . Asthma   . GERD (gastroesophageal reflux disease)   . Rectal fissure   . Hypertension   . Costochondritis   . Cellulitis 08/2010-08/2011  . Depression   . Bipolar 2 disorder   . Obstructive sleep apnea   . Chest pain     NO CAD by cath 07/27/12, nl LV systolic fxn  . Diabetes mellitus 2000    Type 2, Uncontrolled  . Arthritis   . HLD (hyperlipidemia)     Past Surgical History  Procedure Laterality Date  . Carpal tunnel release    . Bil knee surgery      Family History  Problem Relation Age of Onset  . Diabetes Mother   . Hyperlipidemia Mother   . Depression Mother   . Heart attack Paternal Uncle   . Heart disease Paternal Grandmother   . Heart attack Paternal Grandmother   . Heart attack Paternal Grandfather   . Heart disease Paternal Grandfather     History  Substance Use Topics  . Smoking status: Former Smoker -- 0.30 packs/day for .3 years    Types: Cigarettes    Quit date: 12/06/1993  . Smokeless tobacco: Never Used  . Alcohol Use: No    OB History   Grav Para Term Preterm Abortions TAB  SAB Ect Mult Living                  Review of Systems Ten systems reviewed and are negative for acute change, except as noted in the HPI.    Allergies  Kiwi extract and Nubain  Home Medications   Current Outpatient Rx  Name  Route  Sig  Dispense  Refill  . albuterol (PROAIR HFA) 108 (90 BASE) MCG/ACT inhaler   Inhalation   Inhale 2 puffs into the lungs every 6 (six) hours as needed for wheezing.   1 Inhaler   2   . aspirin EC 81 MG EC tablet   Oral   Take 1 tablet (81 mg total) by mouth daily.         Marland Kitchen atorvastatin (LIPITOR) 20 MG tablet   Oral   Take 1 tablet (20 mg total) by mouth daily.   90 tablet   3   . budesonide-formoterol (SYMBICORT) 160-4.5 MCG/ACT inhaler   Inhalation   Inhale 2 puffs into the lungs 2 (two) times daily.         . budesonide-formoterol (SYMBICORT) 160-4.5 MCG/ACT inhaler   Inhalation   Inhale 2 puffs into the lungs 2 (two) times daily.         . carvedilol (COREG) 12.5 MG tablet  Oral   Take 1 tablet (12.5 mg total) by mouth 2 (two) times daily with a meal.   60 tablet   6   . cetirizine (ZYRTEC) 10 MG tablet   Oral   Take 10 mg by mouth daily as needed for allergies.         . cyclobenzaprine (FLEXERIL) 10 MG tablet   Oral   Take 10 mg by mouth 3 (three) times daily as needed for muscle spasms.         Marland Kitchen glucose blood (ACCU-CHEK AVIVA) test strip      Check blood sugars  before and after meals and at bedtime.   200 each   2   . HYDROcodone-acetaminophen (NORCO) 10-325 MG per tablet   Oral   Take 1 tablet by mouth every 8 (eight) hours as needed for pain.   45 tablet   3   . insulin aspart (NOVOLOG) 100 UNIT/ML injection   Subcutaneous   Inject 50 Units into the skin 3 (three) times daily before meals.   1 vial   12   . insulin glargine (LANTUS SOLOSTAR) 100 UNIT/ML injection   Subcutaneous   Inject 115 Units into the skin 2 (two) times daily.   5 pen   15   . lisinopril (PRINIVIL,ZESTRIL) 10 MG  tablet   Oral   Take 10 mg by mouth daily.           . metFORMIN (GLUCOPHAGE) 500 MG tablet   Oral   Take 1 tablet (500 mg total) by mouth 2 (two) times daily with a meal.   180 tablet   1   . nitroGLYCERIN (NITROSTAT) 0.4 MG SL tablet   Sublingual   Place 1 tablet (0.4 mg total) under the tongue every 5 (five) minutes as needed for chest pain.   50 tablet   3   . nystatin (MYCOSTATIN) powder   Topical   Apply 1 Units topically daily. Applies under stomach         . ondansetron (ZOFRAN ODT) 4 MG disintegrating tablet   Oral   Take 2 tablets (8 mg total) by mouth every 8 (eight) hours as needed for nausea.   10 tablet   0   . oxyCODONE-acetaminophen (PERCOCET/ROXICET) 5-325 MG per tablet   Oral   Take 2 tablets by mouth every 4 (four) hours as needed for pain.   12 tablet   0   . pantoprazole (PROTONIX) 40 MG tablet   Oral   Take 40 mg by mouth daily.           . traMADol (ULTRAM) 50 MG tablet   Oral   Take 1 tablet (50 mg total) by mouth every 6 (six) hours as needed. pain   60 tablet   3   . zolpidem (AMBIEN) 5 MG tablet   Oral   Take 5 mg by mouth at bedtime as needed. For insomnia         . PERCOCET 5-325 MG per tablet   Oral   Take 1 tablet by mouth every 6 (six) hours as needed for pain.   15 tablet   0     Dispense as written.     BP 135/94  Pulse 109  Temp(Src) 97.4 F (36.3 C) (Oral)  Resp 20  SpO2 97%  LMP 04/04/2013  Physical Exam  Nursing note and vitals reviewed. Constitutional: She is oriented to person, place, and time. She appears well-developed and well-nourished. No distress.  HENT:  Head: Normocephalic and atraumatic.  Eyes: Conjunctivae and EOM are normal.  Neck: Normal range of motion.  Pulmonary/Chest: Effort normal.  Musculoskeletal: Normal range of motion.  Neurological: She is alert and oriented to person, place, and time.  Skin: Skin is warm and dry. No rash noted. She is not diaphoretic.  Posterior neck with  3 x 3 cm area of possible induration.  No fluctuance palpated.  No warmth or erythema.  Psychiatric: She has a normal mood and affect. Her behavior is normal.    ED Course  Procedures (including critical care time)  Labs Reviewed - No data to display No results found.   1. Abscess of neck       MDM   incision and drainage performed, however patient unable to tolerate procedure do to pain.  No drainage possibly due to shallow incision versus no drainable fluctuance. Patient advised to use warm compresses on posterior neck and followup with primary care physician for possible drainage is fluctuant develops.  There does not appear to be area to drain at this time.  Pain medication given.  Strict return precautions discussed.        Jaci Carrel, [place] 05/03/13 [phone]

## 2013-05-05 NOTE — ED Provider Notes (Signed)
Medical screening examination/treatment/procedure(s) were performed by non-physician practitioner and as supervising physician I was immediately available for consultation/collaboration.   Julie Manly, MD 05/05/13 0018 

## 2013-05-07 ENCOUNTER — Other Ambulatory Visit: Payer: Self-pay | Admitting: *Deleted

## 2013-05-09 ENCOUNTER — Ambulatory Visit: Payer: Medicaid Other | Admitting: Family Medicine

## 2013-05-10 ENCOUNTER — Other Ambulatory Visit: Payer: Self-pay | Admitting: Family Medicine

## 2013-05-10 MED ORDER — TRAMADOL HCL 50 MG PO TABS: 50.0000 mg | ORAL_TABLET | Freq: Four times a day (QID) | ORAL | Status: DC | PRN

## 2013-05-10 MED ORDER — METFORMIN HCL 500 MG PO TABS: 500.0000 mg | ORAL_TABLET | Freq: Two times a day (BID) | ORAL | Status: DC

## 2013-05-10 MED ORDER — PANTOPRAZOLE SODIUM 40 MG PO TBEC: 40.0000 mg | DELAYED_RELEASE_TABLET | Freq: Every day | ORAL | Status: DC

## 2013-05-10 MED ORDER — ZOLPIDEM TARTRATE 5 MG PO TABS: 5.0000 mg | ORAL_TABLET | Freq: Every evening | ORAL | Status: DC | PRN

## 2013-05-11 ENCOUNTER — Emergency Department (HOSPITAL_COMMUNITY)
Admission: EM | Admit: 2013-05-11 | Discharge: 2013-05-11 | Disposition: A | Discharge status: Home / Self Care | Payer: Medicaid Other | Attending: Emergency Medicine | Admitting: Emergency Medicine

## 2013-05-11 ENCOUNTER — Ambulatory Visit (INDEPENDENT_AMBULATORY_CARE_PROVIDER_SITE_OTHER): Payer: Medicaid Other | Admitting: General Surgery

## 2013-05-11 ENCOUNTER — Encounter (INDEPENDENT_AMBULATORY_CARE_PROVIDER_SITE_OTHER): Payer: Self-pay | Admitting: General Surgery

## 2013-05-11 ENCOUNTER — Ambulatory Visit (INDEPENDENT_AMBULATORY_CARE_PROVIDER_SITE_OTHER): Payer: Medicaid Other | Admitting: Family Medicine

## 2013-05-11 ENCOUNTER — Encounter (HOSPITAL_COMMUNITY): Payer: Self-pay | Admitting: *Deleted

## 2013-05-11 ENCOUNTER — Encounter: Payer: Self-pay | Admitting: Family Medicine

## 2013-05-11 ENCOUNTER — Telehealth: Payer: Self-pay | Admitting: Family Medicine

## 2013-05-11 VITALS — BP 122/88 | HR 99 | Temp 97.9°F | Resp 20 | Ht 62.0 in | Wt 331.0 lb

## 2013-05-11 VITALS — BP 168/105 | HR 115 | Temp 98.5°F | Ht 62.0 in | Wt 331.9 lb

## 2013-05-11 DIAGNOSIS — L0211 Cutaneous abscess of neck: Secondary | ICD-10-CM

## 2013-05-11 DIAGNOSIS — I10 Essential (primary) hypertension: Secondary | ICD-10-CM | POA: Insufficient documentation

## 2013-05-11 DIAGNOSIS — L0212 Furuncle of neck: Secondary | ICD-10-CM

## 2013-05-11 DIAGNOSIS — L0291 Cutaneous abscess, unspecified: Secondary | ICD-10-CM

## 2013-05-11 DIAGNOSIS — F3189 Other bipolar disorder: Secondary | ICD-10-CM | POA: Insufficient documentation

## 2013-05-11 DIAGNOSIS — Z9981 Dependence on supplemental oxygen: Secondary | ICD-10-CM | POA: Insufficient documentation

## 2013-05-11 DIAGNOSIS — G4733 Obstructive sleep apnea (adult) (pediatric): Secondary | ICD-10-CM | POA: Insufficient documentation

## 2013-05-11 DIAGNOSIS — Z862 Personal history of diseases of the blood and blood-forming organs and certain disorders involving the immune mechanism: Secondary | ICD-10-CM | POA: Insufficient documentation

## 2013-05-11 DIAGNOSIS — J449 Chronic obstructive pulmonary disease, unspecified: Secondary | ICD-10-CM | POA: Insufficient documentation

## 2013-05-11 DIAGNOSIS — M542 Cervicalgia: Secondary | ICD-10-CM

## 2013-05-11 DIAGNOSIS — Z79899 Other long term (current) drug therapy: Secondary | ICD-10-CM | POA: Insufficient documentation

## 2013-05-11 DIAGNOSIS — Z7982 Long term (current) use of aspirin: Secondary | ICD-10-CM | POA: Insufficient documentation

## 2013-05-11 DIAGNOSIS — R220 Localized swelling, mass and lump, head: Secondary | ICD-10-CM

## 2013-05-11 DIAGNOSIS — Z8719 Personal history of other diseases of the digestive system: Secondary | ICD-10-CM | POA: Insufficient documentation

## 2013-05-11 DIAGNOSIS — J4489 Other specified chronic obstructive pulmonary disease: Secondary | ICD-10-CM | POA: Insufficient documentation

## 2013-05-11 DIAGNOSIS — E119 Type 2 diabetes mellitus without complications: Secondary | ICD-10-CM | POA: Insufficient documentation

## 2013-05-11 DIAGNOSIS — E785 Hyperlipidemia, unspecified: Secondary | ICD-10-CM | POA: Insufficient documentation

## 2013-05-11 DIAGNOSIS — R221 Localized swelling, mass and lump, neck: Secondary | ICD-10-CM

## 2013-05-11 DIAGNOSIS — Z87891 Personal history of nicotine dependence: Secondary | ICD-10-CM | POA: Insufficient documentation

## 2013-05-11 DIAGNOSIS — Z794 Long term (current) use of insulin: Secondary | ICD-10-CM | POA: Insufficient documentation

## 2013-05-11 DIAGNOSIS — Z8739 Personal history of other diseases of the musculoskeletal system and connective tissue: Secondary | ICD-10-CM | POA: Insufficient documentation

## 2013-05-11 MED ORDER — OXYCODONE-ACETAMINOPHEN 5-325 MG PO TABS
1.0000 | ORAL_TABLET | Freq: Three times a day (TID) | ORAL | Status: DC | PRN
Start: 2013-05-11 — End: 2013-05-25

## 2013-05-11 MED ORDER — KETOROLAC TROMETHAMINE 60 MG/2ML IM SOLN
60.0000 mg | Freq: Once | INTRAMUSCULAR | Status: AC
  Administered 2013-05-11: 60 mg via INTRAMUSCULAR

## 2013-05-11 MED ORDER — CLINDAMYCIN HCL 150 MG PO CAPS: ORAL_CAPSULE | ORAL | Status: DC

## 2013-05-11 MED ORDER — CLINDAMYCIN HCL 300 MG PO CAPS
300.0000 mg | ORAL_CAPSULE | Freq: Once | ORAL | Status: AC
  Administered 2013-05-11: 300 mg via ORAL
  Filled 2013-05-11: qty 1

## 2013-05-11 NOTE — ED Provider Notes (Signed)
History     CSN: [id number]  Arrival date & time 05/11/13  0302   First MD Initiated Contact with Patient 05/11/13 [phone]      Chief Complaint  Patient presents with  . Abscess    (Consider location/radiation/quality/duration/timing/severity/associated sxs/prior treatment) The history is provided by the patient. No language interpreter was used.  [name] is a 37 y/o F with PMHx of DM, HTN, GERD, Asthma, depression presenting to the ED with abscess to the posterior aspect of her neck, reported that she has had this for a long time, but noticed that it has gotten larger 2 days ago. Patient reported that this is a recurrent abscess that she gets at least once or twice a year - believes it is from her CPAP that she wears at night. Patient reported that she experiences a constant throbbing sensation to the back of the neck, sharp shooting pain when she moves her neck - stated that the pain is "unbearable." reported that she was here on 05/02/2013 for drainage, but was unsuccessful due to the abscess being "hard." Denied fever, chills, facial swelling, facial numbness and tingling, neurological deficits, hearing loss.  PCP: Dr. Ashley Royalty   Past Medical History  Diagnosis Date  . Morbid obesity   . Alveolar hypoventilation   . Asthma   . GERD (gastroesophageal reflux disease)   . Rectal fissure   . Hypertension   . Costochondritis   . Cellulitis 08/2010-08/2011  . Depression   . Bipolar 2 disorder   . Obstructive sleep apnea   . Chest pain     NO CAD by cath 07/27/12, nl LV systolic fxn  . Diabetes mellitus 2000    Type 2, Uncontrolled  . Arthritis   . HLD (hyperlipidemia)   . Anemia   . COPD (chronic obstructive pulmonary disease)     Past Surgical History  Procedure Laterality Date  . Carpal tunnel release    . Bil knee surgery      Family History  Problem Relation Age of Onset  . Diabetes Mother   . Hyperlipidemia Mother   . Depression Mother   . Heart attack Paternal  Uncle   . Heart disease Paternal Grandmother   . Heart attack Paternal Grandmother   . Heart attack Paternal Grandfather   . Heart disease Paternal Grandfather     History  Substance Use Topics  . Smoking status: Former Smoker -- 0.30 packs/day for .3 years    Types: Cigarettes    Quit date: 12/06/1993  . Smokeless tobacco: Never Used  . Alcohol Use: No    OB History   Grav Para Term Preterm Abortions TAB SAB Ect Mult Living                  Review of Systems  Constitutional: Negative for fever, chills and fatigue.  HENT: Positive for neck pain (secondary to abscess). Negative for hearing loss, ear pain, congestion, sore throat, trouble swallowing, neck stiffness and sinus pressure.   Eyes: Negative for pain and visual disturbance.  Respiratory: Negative for chest tightness and shortness of breath.   Cardiovascular: Negative for chest pain.  Skin:       Reoccurring abscess  Neurological: Negative for dizziness, weakness, light-headedness, numbness and headaches.  All other systems reviewed and are negative.    Allergies  Kiwi extract and Nubain  Home Medications   Current Outpatient Rx  Name  Route  Sig  Dispense  Refill  . aspirin EC 81 MG EC  tablet   Oral   Take 1 tablet (81 mg total) by mouth daily.         Marland Kitchen atorvastatin (LIPITOR) 20 MG tablet   Oral   Take 1 tablet (20 mg total) by mouth daily.   90 tablet   3   . budesonide-formoterol (SYMBICORT) 160-4.5 MCG/ACT inhaler   Inhalation   Inhale 2 puffs into the lungs 2 (two) times daily.         . carvedilol (COREG) 12.5 MG tablet   Oral   Take 1 tablet (12.5 mg total) by mouth 2 (two) times daily with a meal.   60 tablet   6   . cyclobenzaprine (FLEXERIL) 10 MG tablet   Oral   Take 10 mg by mouth 3 (three) times daily as needed for muscle spasms.         Marland Kitchen glucose blood (ACCU-CHEK AVIVA) test strip      Check blood sugars  before and after meals and at bedtime.   200 each   2   .  HYDROcodone-acetaminophen (NORCO) 10-325 MG per tablet   Oral   Take 1 tablet by mouth every 8 (eight) hours as needed for pain.   45 tablet   3   . insulin aspart (NOVOLOG) 100 UNIT/ML injection   Subcutaneous   Inject 50 Units into the skin 3 (three) times daily before meals.   1 vial   12   . insulin glargine (LANTUS SOLOSTAR) 100 UNIT/ML injection   Subcutaneous   Inject 115 Units into the skin 2 (two) times daily.   5 pen   15   . lisinopril (PRINIVIL,ZESTRIL) 10 MG tablet   Oral   Take 10 mg by mouth daily.           . metFORMIN (GLUCOPHAGE) 500 MG tablet   Oral   Take 1 tablet (500 mg total) by mouth 2 (two) times daily with a meal.   180 tablet   1   . nystatin (MYCOSTATIN) powder   Topical   Apply 1 Units topically daily. Applies under stomach         . pantoprazole (PROTONIX) 40 MG tablet   Oral   Take 1 tablet (40 mg total) by mouth daily.   90 tablet   1   . traMADol (ULTRAM) 50 MG tablet   Oral   Take 1 tablet (50 mg total) by mouth every 6 (six) hours as needed. pain   60 tablet   3   . zolpidem (AMBIEN) 5 MG tablet   Oral   Take 1 tablet (5 mg total) by mouth at bedtime as needed. For insomnia   30 tablet   2   . albuterol (PROAIR HFA) 108 (90 BASE) MCG/ACT inhaler   Inhalation   Inhale 2 puffs into the lungs every 6 (six) hours as needed for wheezing.   1 Inhaler   2   . cetirizine (ZYRTEC) 10 MG tablet   Oral   Take 10 mg by mouth daily as needed for allergies.         . clindamycin (CLEOCIN) 150 MG capsule      Take 3 tablets PO TID x 10 days   90 capsule   0   . nitroGLYCERIN (NITROSTAT) 0.4 MG SL tablet   Sublingual   Place 1 tablet (0.4 mg total) under the tongue every 5 (five) minutes as needed for chest pain.   50 tablet   3   .  oxyCODONE-acetaminophen (ROXICET) 5-325 MG per tablet   Oral   Take 1 tablet by mouth every 8 (eight) hours as needed for pain. Ok to fill, this is for an acute issue.   15 tablet   0      BP 147/98  Pulse 114  Temp(Src) 98.9 F (37.2 C) (Oral)  Resp 16  Ht 5\' 2"  (1.575 m)  Wt 340 lb (154.223 kg)  BMI 62.17 kg/m2  SpO2 98%  LMP 05/07/2013  Physical Exam  Nursing note and vitals reviewed. Constitutional: She is oriented to person, place, and time. She appears well-developed and well-nourished. No distress.  HENT:  Head: Normocephalic and atraumatic.  Eyes: Conjunctivae and EOM are normal. Pupils are equal, round, and reactive to light.  Neck: Normal range of motion. Neck supple.  Negative nuchal rigidity Negative neck stiffness Negative lymphadenopathy  4.5 cm x 3 cm circular abscess to the posterior aspect of neck - mild erythema noted - indurated, negative fluctuance. Negative drainage. Negative sign of cellulitis noted.   Cardiovascular: Normal rate, regular rhythm and normal heart sounds.  Exam reveals no friction rub.   No murmur heard. Pulmonary/Chest: Effort normal and breath sounds normal. No respiratory distress. She has no wheezes. She has no rales.  Lymphadenopathy:    She has no cervical adenopathy.  Neurological: She is alert and oriented to person, place, and time. No cranial nerve deficit. She exhibits normal muscle tone. Coordination normal.  Skin: Skin is warm and dry. She is not diaphoretic.  Psychiatric: She has a normal mood and affect. Her behavior is normal. Thought content normal.    ED Course  Procedures (including critical care time)  Medications  clindamycin (CLEOCIN) capsule 300 mg (300 mg Oral Given 05/11/13 0700)    Labs Reviewed - No data to display No results found.   1. Abscess       MDM  Patient stable, afebrile. Abscess noted to the posterior aspect of the neck - extremely indurated, hard to the touch - negative signs of cellulitis. I&D not performed due to induration and would most likely be unsuccessful - discussed case with Dr. [name] Rous, agreed. Patient given one dose clindamycin in ED setting. Negative systemic  symptoms. Patient discharged with antibiotics - discussed course. Referred patient to General Surgery - since abscess is reoccurring educated patient that sinus tract needs to be removed to prevent continuous reoccurrence. Discussed with patient to continue to apply warm compressions daily. Discussed with patient to monitor symptoms and if symptoms are to worsen or change to report back to the ED. Patient agreed to plan of care, understood, all questions answered.          Raymon Mutton, PA-C 05/11/13 1710

## 2013-05-11 NOTE — Progress Notes (Signed)
Chief Complaint  Patient presents with  . Follow-up    eval furnucle on side of neck    HISTORY:  [name] is a 37 y.o. female who presents to clinic with a posterior nec mass.  This has become erythematous and painful.  She was started on clindamycin today.  Past Medical History  Diagnosis Date  . Morbid obesity   . Alveolar hypoventilation   . Asthma   . GERD (gastroesophageal reflux disease)   . Rectal fissure   . Hypertension   . Costochondritis   . Cellulitis 08/2010-08/2011  . Depression   . Bipolar 2 disorder   . Obstructive sleep apnea   . Chest pain     NO CAD by cath 07/27/12, nl LV systolic fxn  . Diabetes mellitus 2000    Type 2, Uncontrolled  . Arthritis   . HLD (hyperlipidemia)   . Anemia   . COPD (chronic obstructive pulmonary disease)        Past Surgical History  Procedure Laterality Date  . Carpal tunnel release    . Bil knee surgery        Current Outpatient Prescriptions  Medication Sig Dispense Refill  . albuterol (PROAIR HFA) 108 (90 BASE) MCG/ACT inhaler Inhale 2 puffs into the lungs every 6 (six) hours as needed for wheezing.  1 Inhaler  2  . aspirin EC 81 MG EC tablet Take 1 tablet (81 mg total) by mouth daily.      Marland Kitchen atorvastatin (LIPITOR) 20 MG tablet Take 1 tablet (20 mg total) by mouth daily.  90 tablet  3  . budesonide-formoterol (SYMBICORT) 160-4.5 MCG/ACT inhaler Inhale 2 puffs into the lungs 2 (two) times daily.      . carvedilol (COREG) 12.5 MG tablet Take 1 tablet (12.5 mg total) by mouth 2 (two) times daily with a meal.  60 tablet  6  . cetirizine (ZYRTEC) 10 MG tablet Take 10 mg by mouth daily as needed for allergies.      . clindamycin (CLEOCIN) 150 MG capsule Take 3 tablets PO TID x 10 days  90 capsule  0  . cyclobenzaprine (FLEXERIL) 10 MG tablet Take 10 mg by mouth 3 (three) times daily as needed for muscle spasms.      Marland Kitchen glucose blood (ACCU-CHEK AVIVA) test strip Check blood sugars  before and after meals and at bedtime.  200  each  2  . HYDROcodone-acetaminophen (NORCO) 10-325 MG per tablet Take 1 tablet by mouth every 8 (eight) hours as needed for pain.  45 tablet  3  . insulin aspart (NOVOLOG) 100 UNIT/ML injection Inject 50 Units into the skin 3 (three) times daily before meals.  1 vial  12  . insulin glargine (LANTUS SOLOSTAR) 100 UNIT/ML injection Inject 115 Units into the skin 2 (two) times daily.  5 pen  15  . lisinopril (PRINIVIL,ZESTRIL) 10 MG tablet Take 10 mg by mouth daily.        . metFORMIN (GLUCOPHAGE) 500 MG tablet Take 1 tablet (500 mg total) by mouth 2 (two) times daily with a meal.  180 tablet  1  . nitroGLYCERIN (NITROSTAT) 0.4 MG SL tablet Place 1 tablet (0.4 mg total) under the tongue every 5 (five) minutes as needed for chest pain.  50 tablet  3  . nystatin (MYCOSTATIN) powder Apply 1 Units topically daily. Applies under stomach      . oxyCODONE-acetaminophen (ROXICET) 5-325 MG per tablet Take 1 tablet by mouth every 8 (eight) hours  as needed for pain. Ok to fill, this is for an acute issue.  15 tablet  0  . pantoprazole (PROTONIX) 40 MG tablet Take 1 tablet (40 mg total) by mouth daily.  90 tablet  1  . traMADol (ULTRAM) 50 MG tablet Take 1 tablet (50 mg total) by mouth every 6 (six) hours as needed. pain  60 tablet  3  . zolpidem (AMBIEN) 5 MG tablet Take 1 tablet (5 mg total) by mouth at bedtime as needed. For insomnia  30 tablet  2   No current facility-administered medications for this visit.     Allergies  Allergen Reactions  . Kiwi Extract Anaphylaxis  . Nubain (Nalbuphine Hcl) Other (See Comments)    Nervous...makes her feel like something is crawling on her.      Family History  Problem Relation Age of Onset  . Diabetes Mother   . Hyperlipidemia Mother   . Depression Mother   . Heart attack Paternal Uncle   . Heart disease Paternal Grandmother   . Heart attack Paternal Grandmother   . Heart attack Paternal Grandfather   . Heart disease Paternal Grandfather       History    Social History  . Marital Status: Divorced    Spouse Name: N/A    Number of Children: N/A  . Years of Education: N/A   Social History Main Topics  . Smoking status: Former Smoker -- 0.30 packs/day for .3 years    Types: Cigarettes    Quit date: 12/06/1993  . Smokeless tobacco: Never Used  . Alcohol Use: No  . Drug Use: No  . Sexually Active: Yes    Birth Control/ Protection: None   Other Topics Concern  . None   Social History Narrative   Lives in [place] with her fiance and 35 yr old dtr.       REVIEW OF SYSTEMS - PERTINENT POSITIVES ONLY: Review of Systems - General ROS: negative for - chills or fever ENT ROS: negative for - neck lyphadenopathy Respiratory ROS: no cough, shortness of breath, or wheezing Cardiovascular ROS: no chest pain or dyspnea on exertion  EXAM: Filed Vitals:   05/11/13 1525  BP: 122/88  Pulse: 99  Temp: 97.9 F (36.6 C)  Resp: 20    General appearance: alert and cooperative Resp: clear to auscultation bilaterally Cardio: regular rate and rhythm GI: soft, non-tender; bowel sounds normal; no masses,  no organomegaly posterior neck mass, erythematous, no fluctuant masses  US performed in the office.  No fluid collection identified.  ASSESSMENT AND PLAN: [name] is a 37 y.o. F with a posterior neck mass.  This appears to be inflamed.  She has just started clindamycin for this.  I have recommended that she continue this for now.  If her pain gets worse, I recommended that she come back to the office for re-evaluation for I&D.  If the antibiotics clear up the inflammation, she should return to the office to discuss removal surgically.    Vanita Panda, MD Colon and Rectal Surgery / General Surgery [facility]      Visit Diagnoses: No diagnosis found.  Primary Care Physician: MATTHEWS,CODY, DO

## 2013-05-11 NOTE — Patient Instructions (Addendum)
Thank you for coming in today, it was good to see you Start your antibiotic The pharmacy said they are out of percocet but you should be able to fill the prescription at another pharmacy.

## 2013-05-11 NOTE — Patient Instructions (Signed)
Continue antibiotics.  Return to office once inflammation has subsided to discuss surgical removal.

## 2013-05-11 NOTE — Telephone Encounter (Signed)
EMERGENCY LINE CALL: Pt calls reporting abscess on her neck.  Has an appointment in a few hours but she feels like it is getting worse.  Has been using warm compresses and a lot of pain.  Is able to swallow and breath, it is on the lower crease of her neck.  No fevers, just a lot of pain.  Advised pt that I cannot Rx over the phone and that she can go to teh ER tonight if she feels like she can't wait until the morning for her clinic appt.

## 2013-05-11 NOTE — ED Notes (Signed)
Pt has an abscess on the back of her neck. Pt states that the abscess has been there for a few days and she was seen here to have the abscess opened and nothing came out, the abscess has since closed again and she states that he has increased in pain as well as redness over the last couple of days pt has percocet rx but states that she cannot get it filled since she has vicoden from her previous rx

## 2013-05-13 DIAGNOSIS — L0211 Cutaneous abscess of neck: Secondary | ICD-10-CM | POA: Insufficient documentation

## 2013-05-13 NOTE — Progress Notes (Signed)
  Subjective:    Patient ID: [name], female    DOB: Apr 05, 1976, 37 y.o.   MRN: [id number]  HPI  1. Neck abscess:  Here with complaint of neck abscess.  Was seen in ED last week for this problem and I&D attempted but unable to drain anything.  Seen again this morning in ED and area without flucutance so I&D was not attempted this time. She was given one dose of clindamycin in the ED and sent home with prescriptions.  She has been unable to fill the percocet rx'ed by the pharmacy because she recently had hydrocodone filled (chronic pain medication).  This abscess has recurred ~5-6x over the past few months.  She denies headache, nuchal rigidity, fever, chills.    Review of Systems Per HPI    Objective:   Physical Exam  Constitutional:  Morbidly obese, tearful appears to be in pain.   Neck: Normal range of motion.  Lymphadenopathy:    She has no cervical adenopathy.  Skin:  Indurated, erythematous area along back of the neck, tender to touch.  No induration and no drainage.          Assessment & Plan:

## 2013-05-13 NOTE — Assessment & Plan Note (Signed)
Recurrent neck  Abscess, indurated without fluctuation.  I&D attempted previously without unable to express any material.  Given recurrent nature, concern for sinus tract.  Will continue clindamycin and refer to surger for evaluation of sinus tract excision.  Percocet for pain control, printed new rx stating ok to fill for this acute issue.

## 2013-05-17 NOTE — ED Provider Notes (Signed)
Medical screening examination/treatment/procedure(s) were performed by non-physician practitioner and as supervising physician I was immediately available for consultation/collaboration.   Natia Fahmy H Renada Cronin, MD 05/17/13 1500 

## 2013-05-21 ENCOUNTER — Ambulatory Visit (INDEPENDENT_AMBULATORY_CARE_PROVIDER_SITE_OTHER): Payer: Self-pay | Admitting: General Surgery

## 2013-05-25 ENCOUNTER — Encounter: Payer: Self-pay | Admitting: Family Medicine

## 2013-05-25 ENCOUNTER — Ambulatory Visit (INDEPENDENT_AMBULATORY_CARE_PROVIDER_SITE_OTHER): Payer: Medicaid Other | Admitting: Family Medicine

## 2013-05-25 VITALS — BP 146/88 | HR 98 | Temp 98.1°F | Ht 62.0 in | Wt 330.0 lb

## 2013-05-25 DIAGNOSIS — M542 Cervicalgia: Secondary | ICD-10-CM

## 2013-05-25 DIAGNOSIS — L0211 Cutaneous abscess of neck: Secondary | ICD-10-CM

## 2013-05-25 MED ORDER — KETOROLAC TROMETHAMINE 60 MG/2ML IM SOLN
60.0000 mg | Freq: Once | INTRAMUSCULAR | Status: AC
  Administered 2013-05-25: 60 mg via INTRAMUSCULAR

## 2013-05-25 MED ORDER — DOXYCYCLINE HYCLATE 100 MG PO TABS: 100.0000 mg | ORAL_TABLET | Freq: Two times a day (BID) | ORAL | Status: DC

## 2013-05-25 MED ORDER — OXYCODONE-ACETAMINOPHEN 5-325 MG PO TABS: 1.0000 | ORAL_TABLET | Freq: Three times a day (TID) | ORAL | Status: DC | PRN

## 2013-05-25 NOTE — Assessment & Plan Note (Signed)
Culture for sensitivities.  Doxy x10 days.

## 2013-05-25 NOTE — Patient Instructions (Signed)
We will call you if we need to change antibiotics.

## 2013-05-25 NOTE — Progress Notes (Signed)
Patient ID: [name], female   DOB: 1975/12/23, 37 y.o.   MRN: [id number] Subjective: The patient is a 37 y.o. year old female who presents today for recurrent abscess.  Patient reports that he has some back of her neck was getting better we'll she was taking antibiotics. She stopped them a week to week and half ago and it has now returned. She reports she has seen general surgery and they reported they could not do anything until the infection was gone. I do not see a record of this in the computer system. Patient denies any nausea or vomiting but is having significant pain in the back of her neck radiating around to the front of her neck. Is not having any problems breathing.  Patient's past medical, social, and family history were reviewed and updated as appropriate. History  Substance Use Topics  . Smoking status: Former Smoker -- 0.30 packs/day for .3 years    Types: Cigarettes    Quit date: 12/06/1993  . Smokeless tobacco: Never Used  . Alcohol Use: No   Objective:  Filed Vitals:   05/25/13 0845  BP: 146/88  Pulse: 98  Temp: 98.1 F (36.7 C)   Gen: No acute distress, morbidly obese Skin: There is a draining abscess on the back of the neck. Area of induration is approximately 6 x 8 cm. Area of fluctuance is approximately 2 x 2 centimeters.  Procedure note: Consent was obtained time out was performed. Area was numbed with 2% lidocaine with epinephrine. Cruciate incision was made with a  #11 blade and significant quantities of purulent material was drained. Sterile Q-tip was used to break loculations. Following this the abscess cavity was packed. Patient tolerated the procedure well and was sent home with pain medication and oral antibiotics. Culture was obtained.  Assessment/Plan:  Please also see individual problems in problem list for problem-specific plans.

## 2013-05-25 NOTE — Addendum Note (Signed)
Addended by: Tanna Savoy on: 05/25/2013 12:17 PM   Modules accepted: Orders

## 2013-05-27 LAB — WOUND CULTURE: Gram Stain: NONE SEEN

## 2013-06-07 ENCOUNTER — Ambulatory Visit (INDEPENDENT_AMBULATORY_CARE_PROVIDER_SITE_OTHER): Payer: Medicaid Other | Admitting: Family Medicine

## 2013-06-07 ENCOUNTER — Encounter: Payer: Self-pay | Admitting: Family Medicine

## 2013-06-07 ENCOUNTER — Other Ambulatory Visit: Payer: Self-pay | Admitting: Family Medicine

## 2013-06-07 VITALS — BP 129/84 | HR 106 | Ht 62.0 in | Wt 325.0 lb

## 2013-06-07 DIAGNOSIS — L03221 Cellulitis of neck: Secondary | ICD-10-CM

## 2013-06-07 DIAGNOSIS — M25561 Pain in right knee: Secondary | ICD-10-CM

## 2013-06-07 DIAGNOSIS — M25569 Pain in unspecified knee: Secondary | ICD-10-CM

## 2013-06-07 DIAGNOSIS — IMO0001 Reserved for inherently not codable concepts without codable children: Secondary | ICD-10-CM

## 2013-06-07 DIAGNOSIS — L0211 Cutaneous abscess of neck: Secondary | ICD-10-CM

## 2013-06-07 DIAGNOSIS — IMO0002 Reserved for concepts with insufficient information to code with codable children: Secondary | ICD-10-CM

## 2013-06-07 DIAGNOSIS — E1165 Type 2 diabetes mellitus with hyperglycemia: Secondary | ICD-10-CM

## 2013-06-07 DIAGNOSIS — M25562 Pain in left knee: Secondary | ICD-10-CM

## 2013-06-07 LAB — POCT GLYCOSYLATED HEMOGLOBIN (HGB A1C): Hemoglobin A1C: 13.2

## 2013-06-07 NOTE — Patient Instructions (Addendum)
It was nice to meet you today!  Call to schedule a surgery appointment to talk about your neck. Let me know if you have problems getting in with the surgeon.  Schedule an appointment to come back and see me in a few weeks to talk about your chronic pain.   Also schedule an appointment with Dr. Raymondo Band in pharmacy clinic to address your diabetes. Keep up the same regimen for now. Bring your meter to that visit.  Call if you have any questions or concerns.  Be well, Dr. Pollie Meyer

## 2013-06-07 NOTE — Progress Notes (Signed)
Patient ID: [name], female   DOB: 14-Jul-1976, 37 y.o.   MRN: [id number]   HPI:  Neck abscess: would like to go back to surgery clinic to discuss having sinus tract removed. Has bump on back of neck that has been I&D'd previously 8-9 times. Has been to gen surgery clinic but was told surgery was not an option while it was acutely infected. Is now closed and has no drainage.  Diabetes: checks blood sugars upon waking, before every meal, and when going to bed. She checks it more often when she is sick. In the morning was getting around 100 but is now getting 70s-80s. Before meals gets around 150-200. Has been on insulin for several years (since 2007). Denies any chest pain or shortness of breath. No hx of kidney problems.   Hydrocodone refill: pt uses hydrocodone for bilateral knee pain. Is morbidly obese and needs this medicine to cope with pain in order to exercise and lose weight. Previous PCP did not have her on a pain medicine contract.   ROS: See HPI. No chest pain or shortness of breath. + bilateral knee pain.  PMFSH: Hx morbid obesity. Uses CPAP for OSA.  PHYSICAL EXAM: BP 129/84  Pulse 106  Ht 5\' 2"  (1.575 m)  Wt 325 lb (147.419 kg)  BMI 59.43 kg/m2  LMP 05/07/2013 Gen: obese, NAD, pleasant HEENT: posterior neck with well healing area of previous incision. Nontender to palpation. No drainage noted. No erythema.  Heart: RRR Lungs: CTAB, NWOB Neuro: grossly nonfocal, speech intact Ext: no appreciable lower extremity edema bilaterally. Legs obese. Knees nontender to palpation anteriorly. No gross effusion noted.

## 2013-06-11 ENCOUNTER — Telehealth: Payer: Self-pay | Admitting: Family Medicine

## 2013-06-11 NOTE — Telephone Encounter (Signed)
Pt is requesting that we call [facility] to schedule another appointment. She states the [facility] needs [place] to call them so she can be seen JW

## 2013-06-12 NOTE — Telephone Encounter (Signed)
1.)called [facility] and scheduled OV for 06/19/13 at 11:40 am with Dr.Ramirez 2.) called pt and informed of the appt. [name], [name]

## 2013-06-17 NOTE — Assessment & Plan Note (Signed)
>>  ASSESSMENT AND PLAN FOR INSULIN DEPENDENT TYPE 2 DIABETES MELLITUS ([name]) WRITTEN ON 06/17/2013 12:09 AM BY Icesis Renn, Estevan Ryder, MD  Pt on extremely large amount of insulin (Lantus 115 units BID and novolog 55 units with meals). A1c today is 13.2, which is improved compared to 4 months ago, when it was read as >14. The numbers pt reports do not coincide with this elevated Hgb A1c. Will refer patient to pharmacy clinic who can work with her more closely to achieve better glycemic control.

## 2013-06-17 NOTE — Assessment & Plan Note (Addendum)
Pt on extremely large amount of insulin (Lantus 115 units BID and novolog 55 units with meals). A1c today is 13.2, which is improved compared to 4 months ago, when it was read as >14. The numbers pt reports do not coincide with this elevated Hgb A1c. Will refer patient to pharmacy clinic who can work with her more closely to achieve better glycemic control.

## 2013-06-17 NOTE — Assessment & Plan Note (Signed)
Pt requests refill of hydrocodone today, however from reviewing her previously ordered medications, she is not due for a refill for another month or two. Pt does endorse having another refill on file at the pharmacy. I have asked her to come back for a separate appointment to discuss setting up a pain management contract. I do believe that pt needs this medicine to help her exercise and lose weight (she has had good success thus far) but would like to set up a formal contract if I am going to be providing long term narcotic prescriptions. Pt understands this concern and will schedule an appointment with me in a few weeks.

## 2013-06-17 NOTE — Assessment & Plan Note (Signed)
>>  ASSESSMENT AND PLAN FOR CHRONIC PAIN OF BOTH KNEES WRITTEN ON 06/17/2013 12:13 AM BY Anastassia Noack, Estevan Ryder, MD  Pt requests refill of hydrocodone today, however from reviewing her previously ordered medications, she is not due for a refill for another month or two. Pt does endorse having another refill on file at the pharmacy. I have asked her to come back for a separate appointment to discuss setting up a pain management contract. I do believe that pt needs this medicine to help her exercise and lose weight (she has had good success thus far) but would like to set up a formal contract if I am going to be providing long term narcotic prescriptions. Pt understands this concern and will schedule an appointment with me in a few weeks.

## 2013-06-17 NOTE — Assessment & Plan Note (Signed)
Pt would like to be evaluated by general surgery for surgical options to treat recurrent neck abscess. She has already been seen by [facility] and will call to schedule an appointment with them. Encouraged her to let us know if she has trouble getting in, as I will be happy to order a referral if need be.

## 2013-06-18 IMAGING — CR DG KNEE AP/LAT W/ SUNRISE*L*
7 series · 7 of 7 positions shown · non-contrast
Comparison: Left knee films of 01/01/2009

CLINICAL DATA: Chronic knee pain, no acute injury

DG KNEE - 3 VIEWS

[view not recorded (1 of 7)]
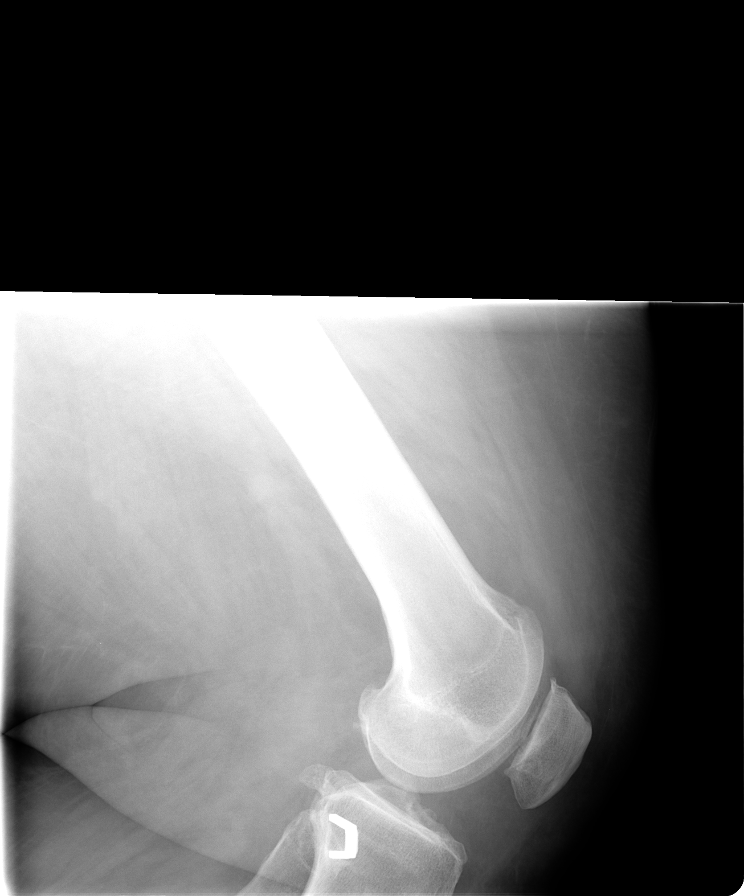

[view not recorded (2 of 7)]
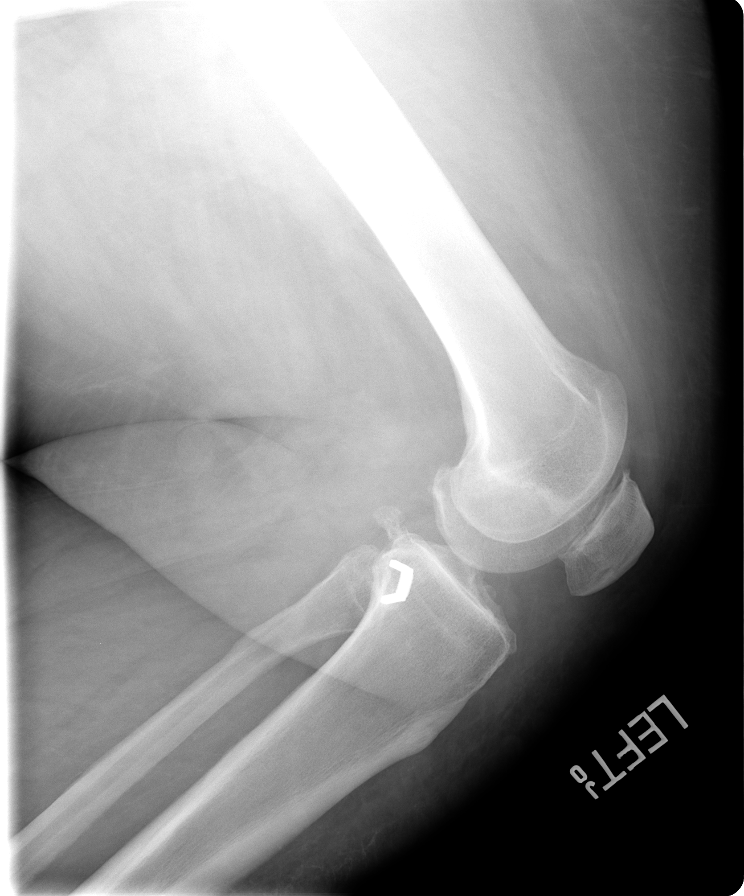

[view not recorded (3 of 7)]
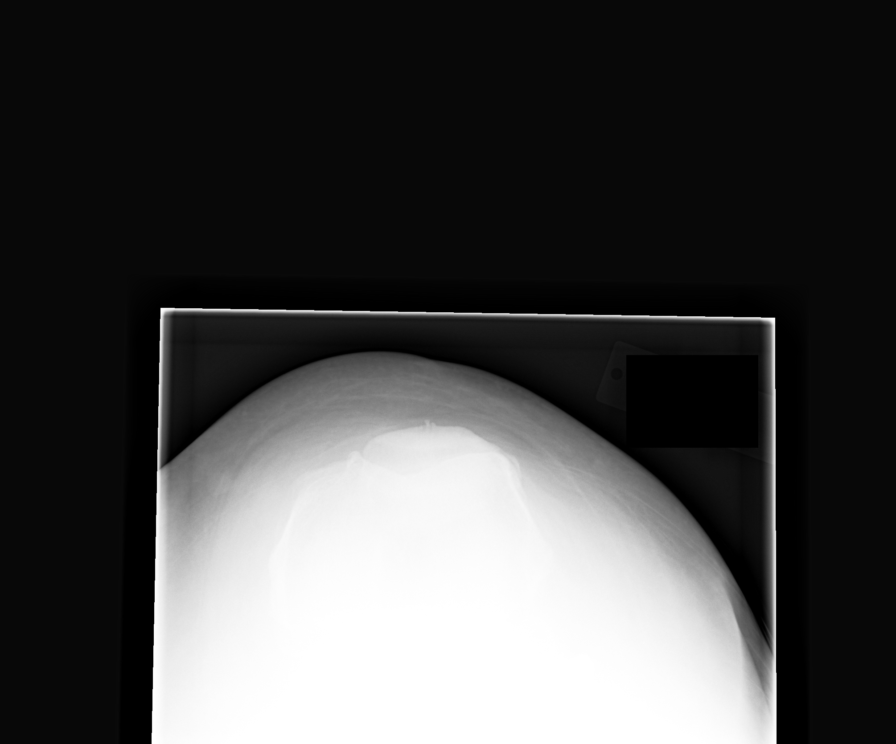

[view not recorded (4 of 7)]
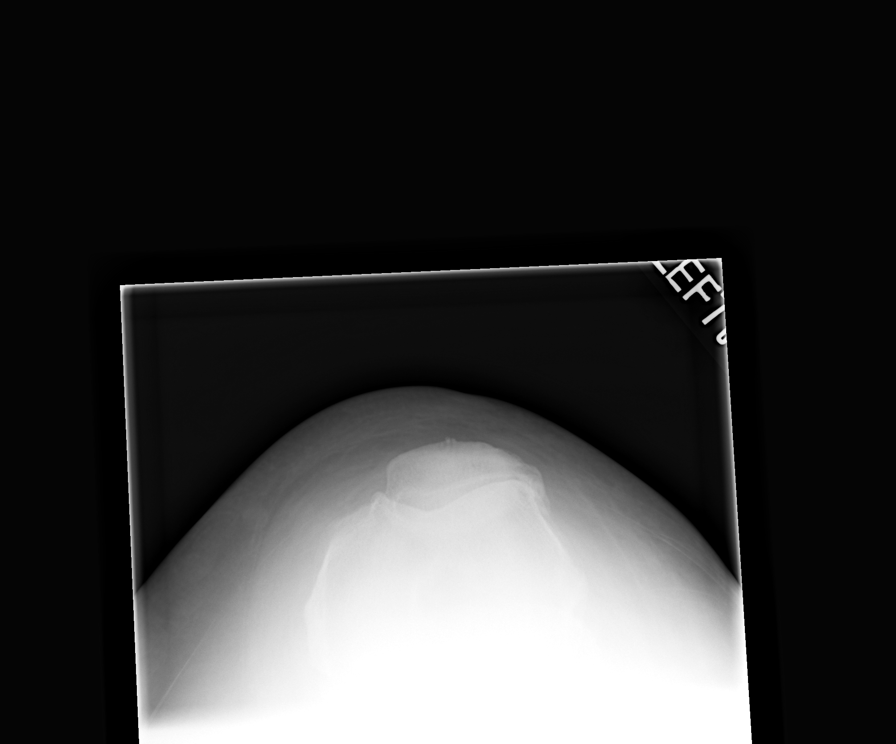

[view not recorded (5 of 7)]
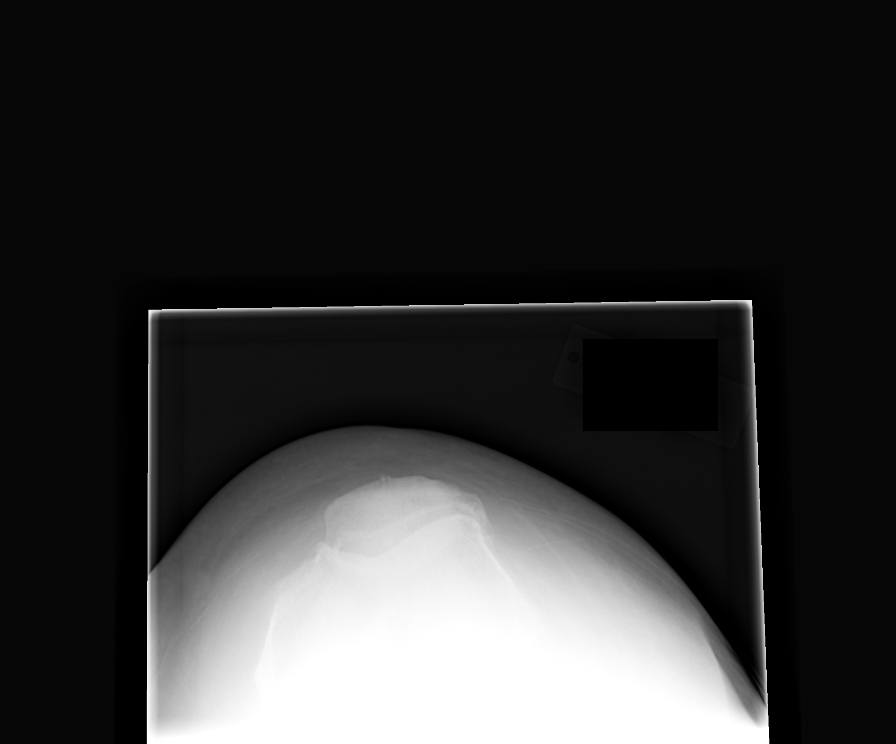

[view not recorded (6 of 7)]
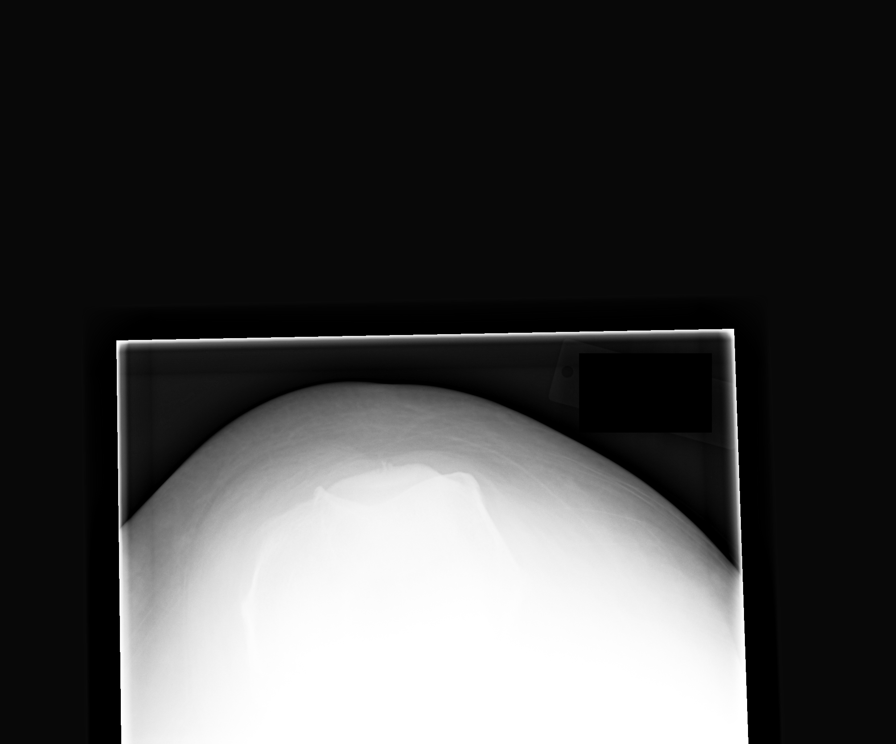

[view not recorded (7 of 7)]
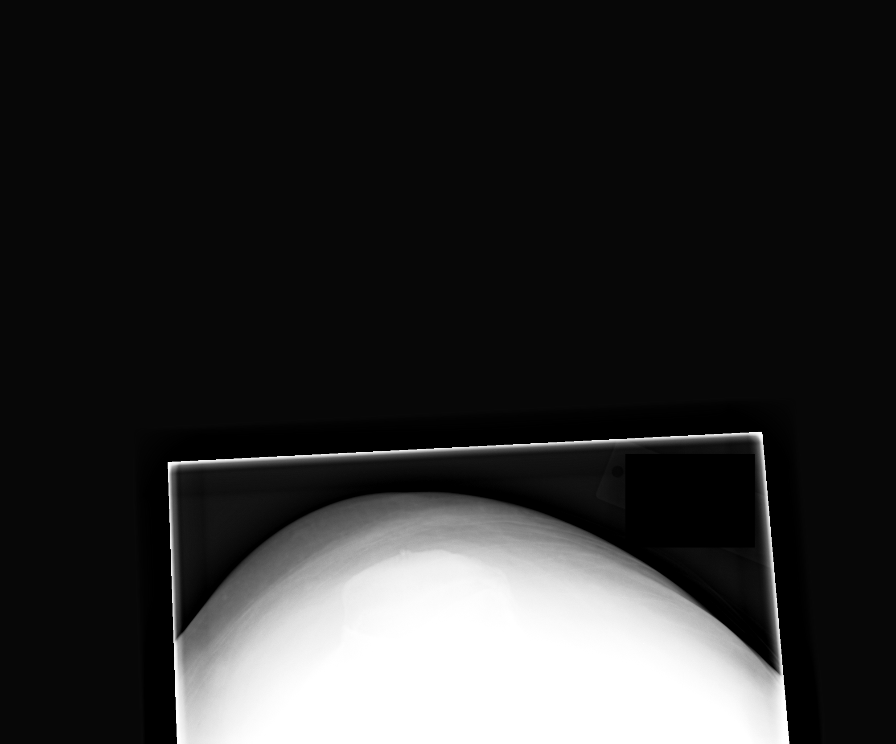

[7 of 7 positions shown; findings below may reference images not displayed]

FINDINGS: There has been slight progression of tricompartmental
degenerative joint disease with mild spurring and slight loss of
medial joint space and patellofemoral space.  No fracture is seen.
No effusion is noted.  The fixation device is present within the
lateral proximal tibia and is unchanged.
IMPRESSION: Slight progression of tricompartmental degenerative joint disease.

## 2013-06-18 IMAGING — CR DG KNEE STANDING AP BILAT
1 series · 1 of 1 positions shown · non-contrast
Comparison: None.

CLINICAL DATA: Chronic bilateral knee pain, no recent injury

BILATERAL KNEES STANDING - 1 VIEW

[view not recorded]
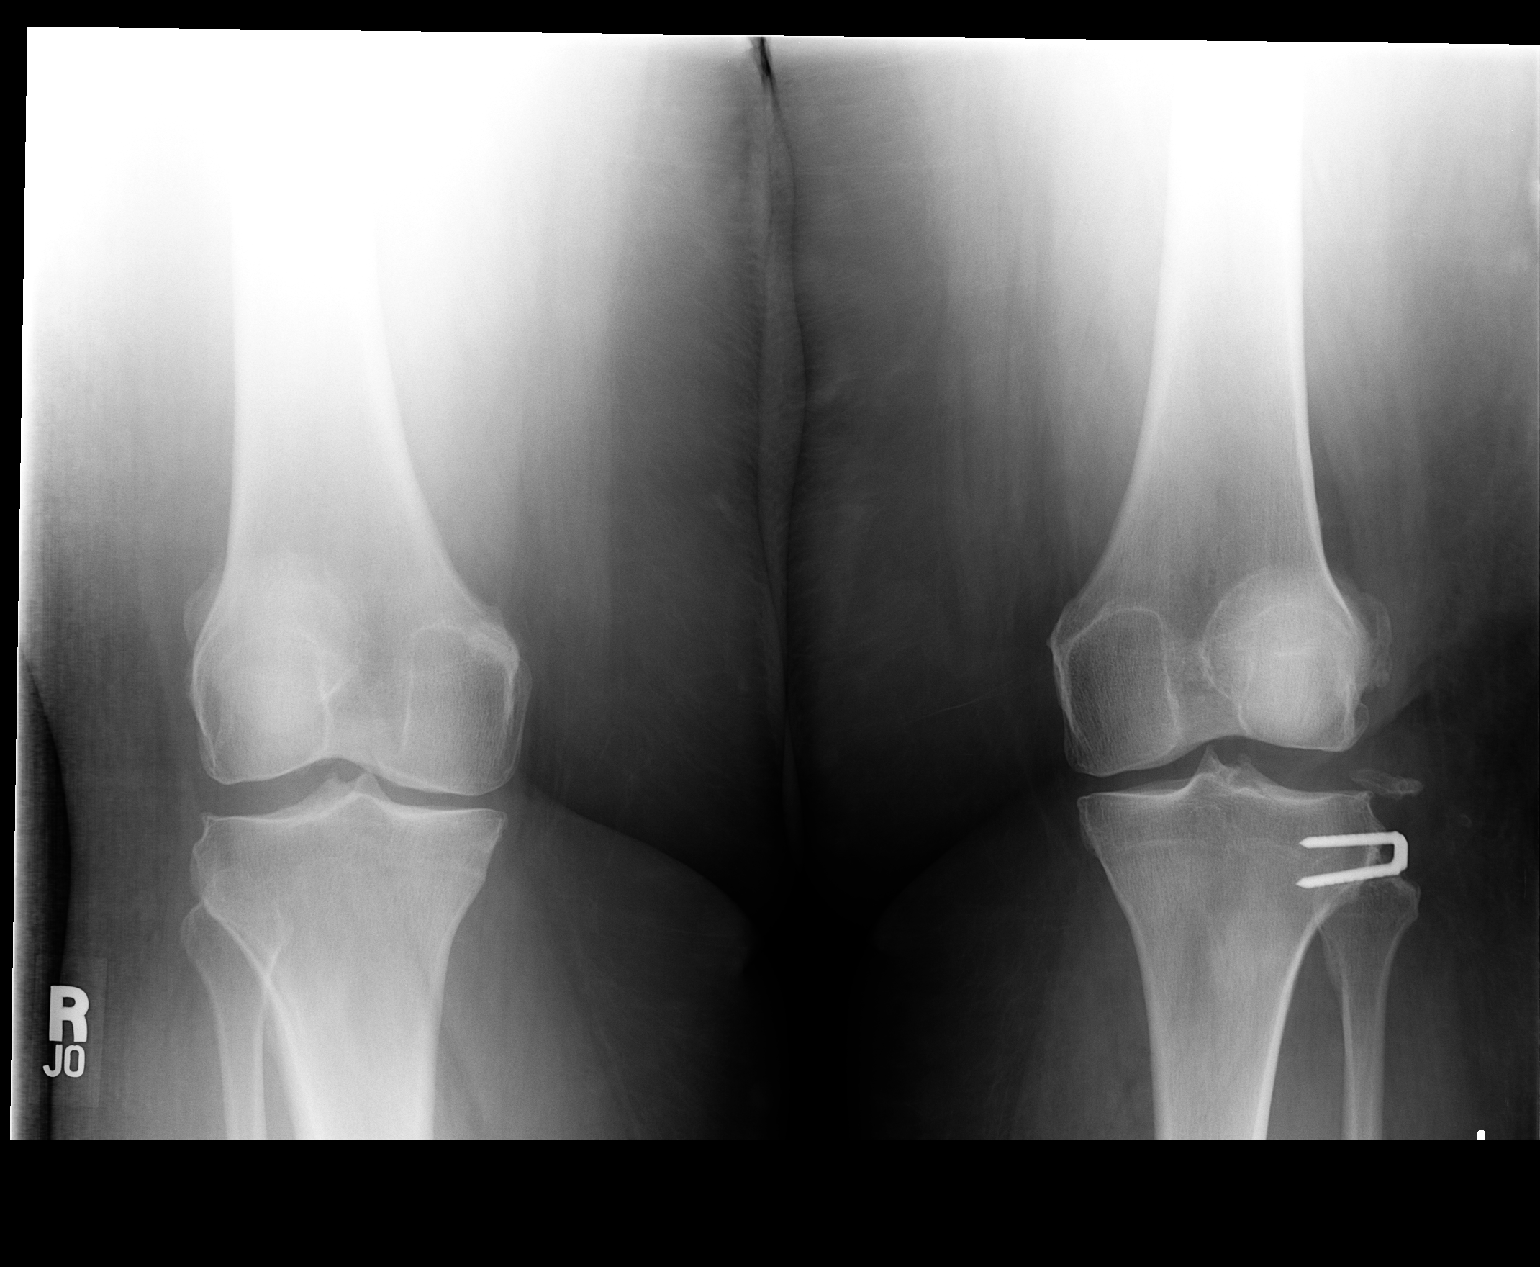

[1 of 1 positions shown; findings below may reference images not displayed]

FINDINGS: Standing views of the knees show no acute abnormality.
There is slight loss of medial joint space right greater than left.
A fixation pin is noted within the lateral proximal left tibia.
IMPRESSION: Mild degenerative joint disease medially right greater than left.
No acute abnormality.

## 2013-06-18 IMAGING — CR DG KNEE AP/LAT W/ SUNRISE*R*
4 series · 4 of 4 positions shown · non-contrast
Comparison: Right knee films of 02/14/2010

CLINICAL DATA: Chronic knee pain, no recent injury

DG KNEE - 3 VIEWS

[view not recorded (1 of 4)]
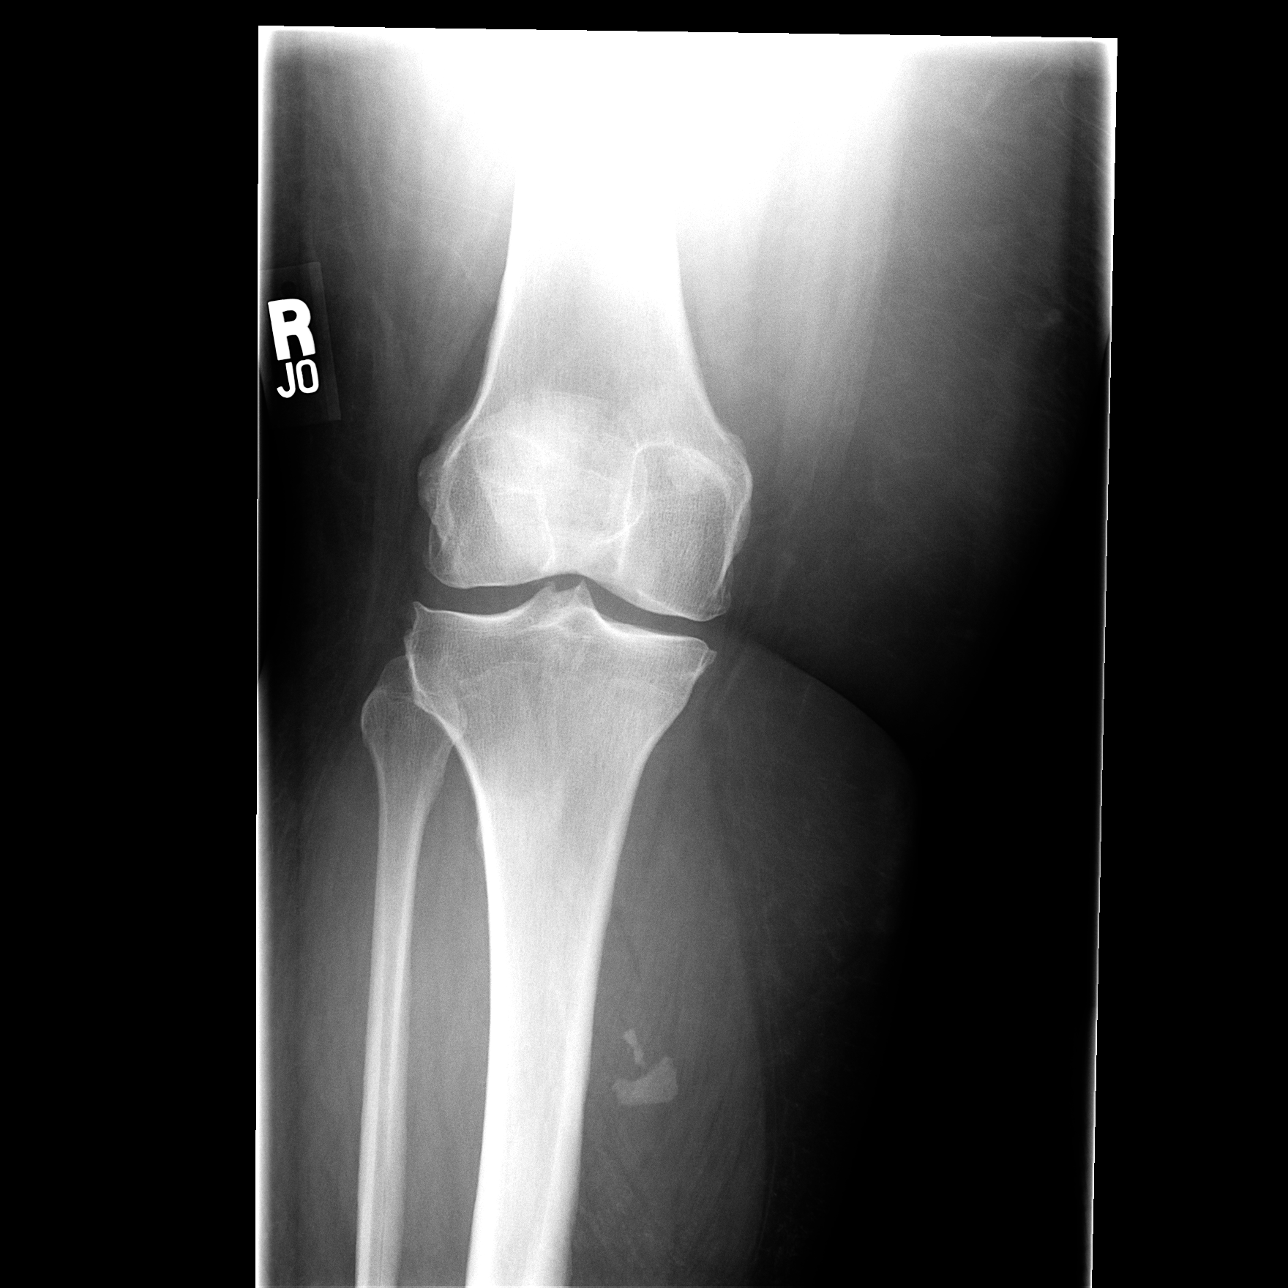

[view not recorded (2 of 4)]
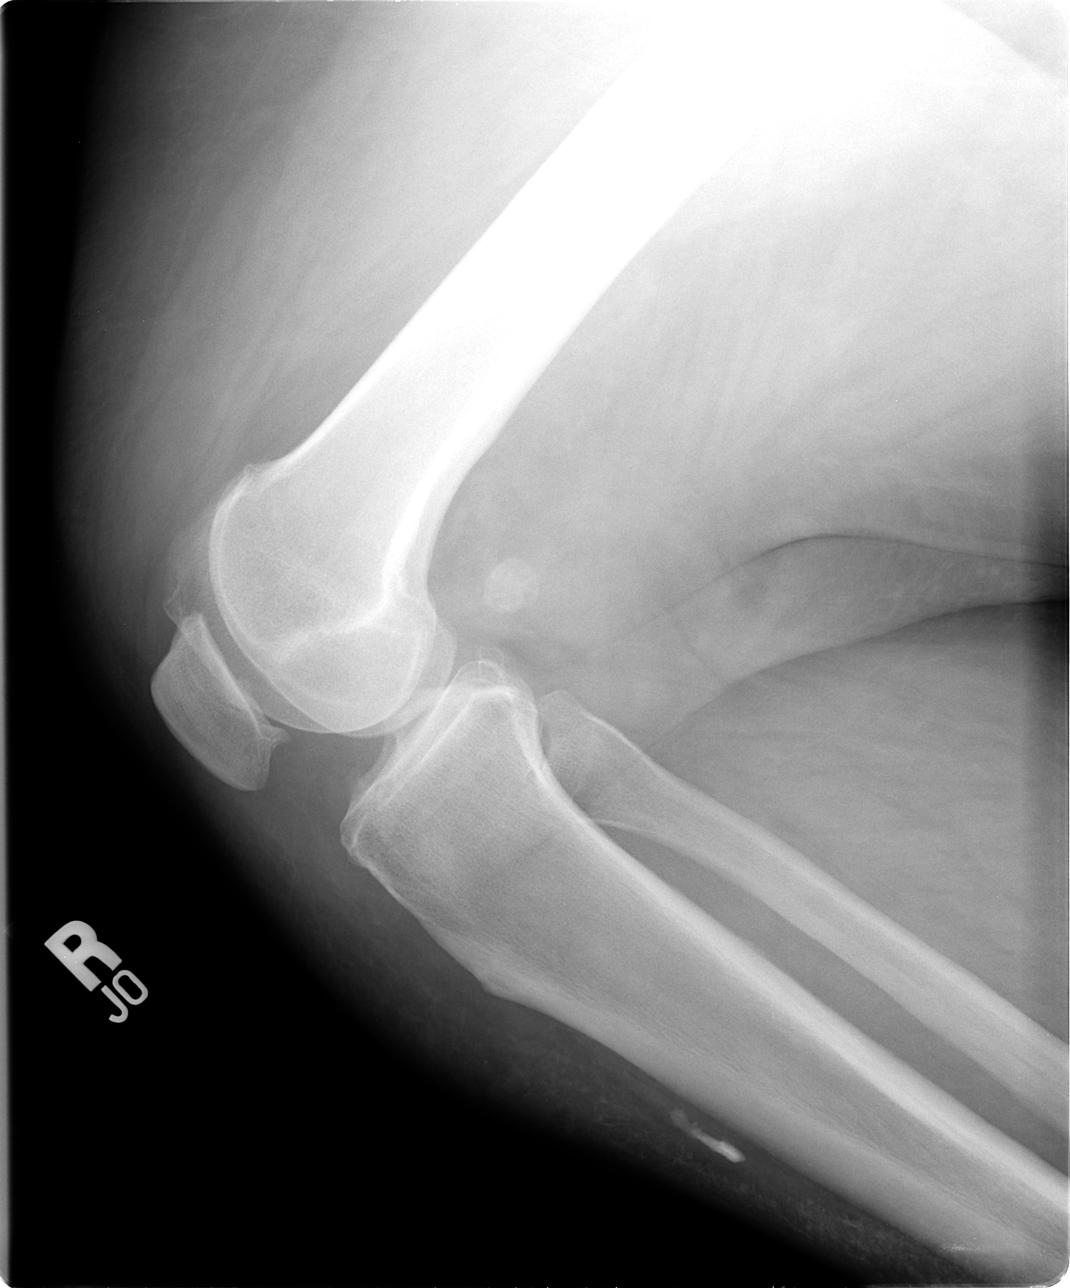

[view not recorded (3 of 4)]
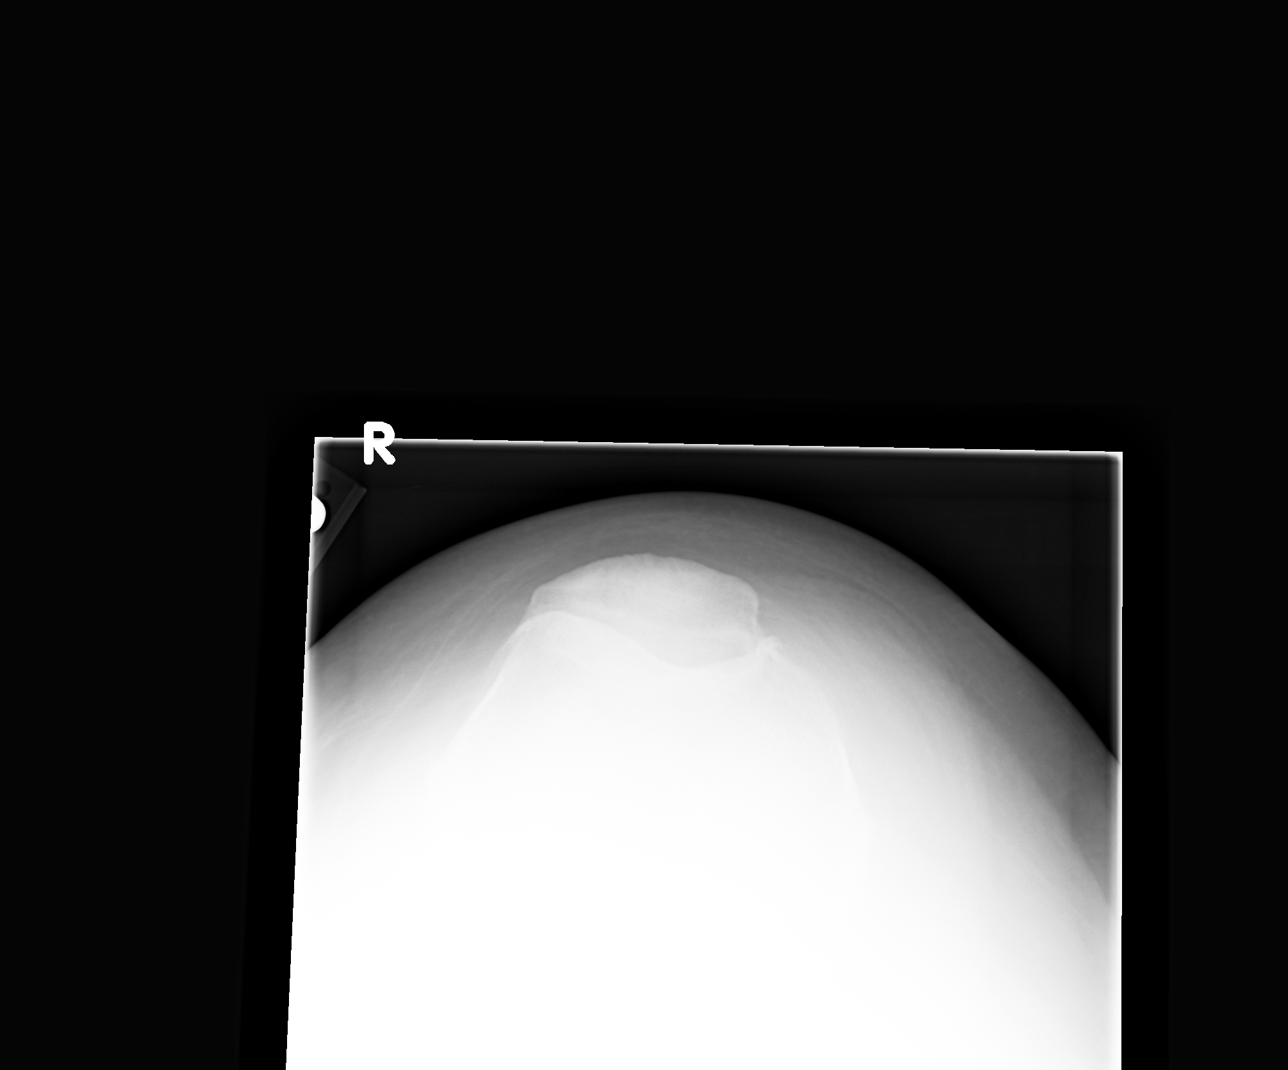

[view not recorded (4 of 4)]
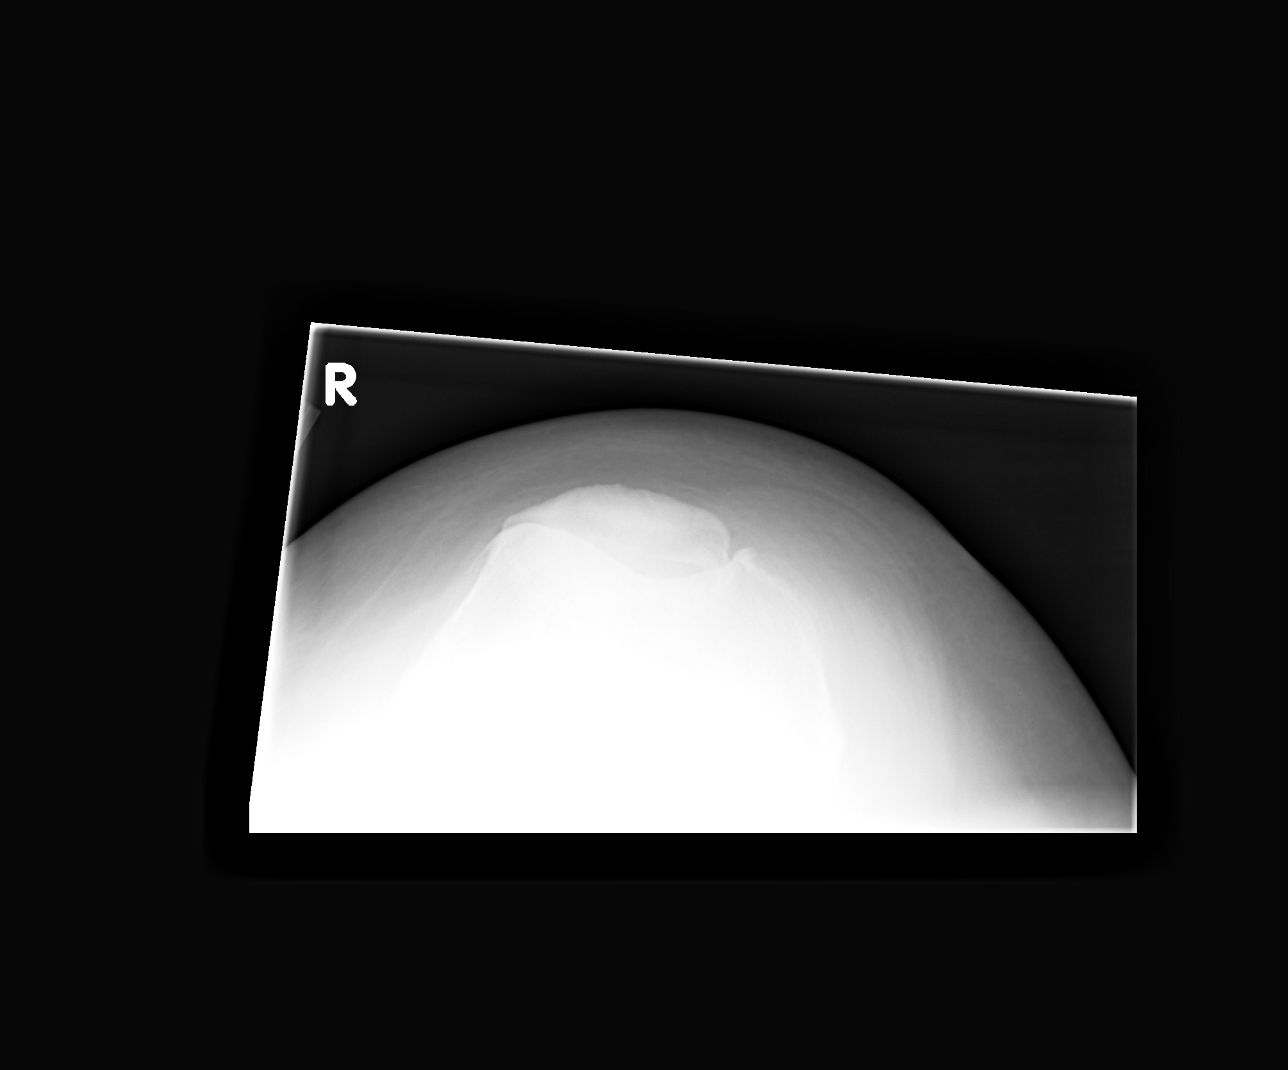

[4 of 4 positions shown; findings below may reference images not displayed]

FINDINGS: There has been slight progression of mild
tricompartmental degenerative joint disease with slight loss of
medial joint space and spurring.  No fracture is seen.
IMPRESSION: Some progression of mild tricompartmental degenerative joint
disease.

## 2013-06-19 ENCOUNTER — Encounter (INDEPENDENT_AMBULATORY_CARE_PROVIDER_SITE_OTHER): Payer: Self-pay | Admitting: General Surgery

## 2013-06-19 ENCOUNTER — Encounter: Payer: Self-pay | Admitting: Pharmacist

## 2013-06-19 ENCOUNTER — Ambulatory Visit (INDEPENDENT_AMBULATORY_CARE_PROVIDER_SITE_OTHER): Payer: Medicaid Other | Admitting: Pharmacist

## 2013-06-19 ENCOUNTER — Ambulatory Visit (INDEPENDENT_AMBULATORY_CARE_PROVIDER_SITE_OTHER): Payer: Medicaid Other | Admitting: General Surgery

## 2013-06-19 ENCOUNTER — Telehealth: Payer: Self-pay | Admitting: *Deleted

## 2013-06-19 ENCOUNTER — Encounter (HOSPITAL_COMMUNITY): Payer: Self-pay | Admitting: Pharmacy Technician

## 2013-06-19 ENCOUNTER — Other Ambulatory Visit (INDEPENDENT_AMBULATORY_CARE_PROVIDER_SITE_OTHER): Payer: Self-pay | Admitting: General Surgery

## 2013-06-19 ENCOUNTER — Telehealth: Payer: Self-pay | Admitting: Family Medicine

## 2013-06-19 VITALS — BP 132/74 | HR 76 | Temp 98.4°F | Resp 16 | Ht 62.0 in | Wt 331.0 lb

## 2013-06-19 VITALS — BP 112/64 | HR 95 | Ht 62.5 in | Wt 328.9 lb

## 2013-06-19 DIAGNOSIS — IMO0002 Reserved for concepts with insufficient information to code with codable children: Secondary | ICD-10-CM

## 2013-06-19 DIAGNOSIS — L0211 Cutaneous abscess of neck: Secondary | ICD-10-CM

## 2013-06-19 DIAGNOSIS — IMO0001 Reserved for inherently not codable concepts without codable children: Secondary | ICD-10-CM

## 2013-06-19 DIAGNOSIS — E1165 Type 2 diabetes mellitus with hyperglycemia: Secondary | ICD-10-CM

## 2013-06-19 MED ORDER — EXENATIDE 5 MCG/0.02ML ~~LOC~~ SOPN: 5.0000 ug | PEN_INJECTOR | Freq: Two times a day (BID) | SUBCUTANEOUS | Status: DC

## 2013-06-19 MED ORDER — INSULIN PEN NEEDLE 30G X 8 MM MISC: 1.0000 | Status: DC | PRN

## 2013-06-19 MED ORDER — GLUCOSE BLOOD VI STRP: ORAL_STRIP | Status: DC

## 2013-06-19 NOTE — Progress Notes (Signed)
Patient ID: [name], female   DOB: [birth year], 37 y.o.   MRN: [id number]  Chief Complaint  Patient presents with  . Follow-up    reck abscess    HPI [name] is a 37 y.o. female.  The patient is a 29-year-old female with a recurrent posterior neck abscess. The patient then having approximately 5 episodes over the last 2 years.impression is recurrent same spot.. She does wear a CPAP mask because of this potentially causes some of this irritation to  HPI  Past Medical History  Diagnosis Date  . Morbid obesity   . Alveolar hypoventilation   . Asthma   . GERD (gastroesophageal reflux disease)   . Rectal fissure   . Hypertension   . Costochondritis   . Cellulitis 08/2010-08/2011  . Depression   . Bipolar 2 disorder   . Obstructive sleep apnea   . Chest pain     NO CAD by cath 07/27/12, nl LV systolic fxn  . Diabetes mellitus 2000    Type 2, Uncontrolled  . Arthritis   . HLD (hyperlipidemia)   . Anemia   . COPD (chronic obstructive pulmonary disease)     Past Surgical History  Procedure Laterality Date  . Carpal tunnel release    . Bil knee surgery      Family History  Problem Relation Age of Onset  . Diabetes Mother   . Hyperlipidemia Mother   . Depression Mother   . Heart attack Paternal Uncle   . Heart disease Paternal Grandmother   . Heart attack Paternal Grandmother   . Heart attack Paternal Grandfather   . Heart disease Paternal Grandfather     Social History History  Substance Use Topics  . Smoking status: Former Smoker -- 0.30 packs/day for .3 years    Types: Cigarettes    Quit date: 12/06/1993  . Smokeless tobacco: Never Used  . Alcohol Use: No    Allergies  Allergen Reactions  . Kiwi Extract Anaphylaxis  . Nubain (Nalbuphine Hcl) Other (See Comments)    Nervous...makes her feel like something is crawling on her.    Current Outpatient Prescriptions  Medication Sig Dispense Refill  . aspirin EC 81 MG EC tablet Take 1 tablet (81 mg total)  by mouth daily.      Marland Kitchen atorvastatin (LIPITOR) 20 MG tablet Take 1 tablet (20 mg total) by mouth daily.  90 tablet  3  . budesonide-formoterol (SYMBICORT) 160-4.5 MCG/ACT inhaler Inhale 2 puffs into the lungs 2 (two) times daily.      . carvedilol (COREG) 12.5 MG tablet Take 1 tablet (12.5 mg total) by mouth 2 (two) times daily with a meal.  60 tablet  6  . cetirizine (ZYRTEC) 10 MG tablet Take 10 mg by mouth daily as needed for allergies.      . cyclobenzaprine (FLEXERIL) 10 MG tablet Take 10 mg by mouth 3 (three) times daily as needed for muscle spasms.      Marland Kitchen glucose blood (ACCU-CHEK AVIVA) test strip Check blood sugars  before and after meals and at bedtime.  200 each  2  . HYDROcodone-acetaminophen (NORCO) 10-325 MG per tablet Take 1 tablet by mouth every 8 (eight) hours as needed for pain.  45 tablet  3  . insulin aspart (NOVOLOG) 100 UNIT/ML injection Inject 50 Units into the skin 3 (three) times daily before meals.  1 vial  12  . insulin glargine (LANTUS SOLOSTAR) 100 UNIT/ML injection Inject 115 Units into the skin 2 (  two) times daily.  5 pen  15  . lisinopril (PRINIVIL,ZESTRIL) 10 MG tablet Take 10 mg by mouth daily.        . metFORMIN (GLUCOPHAGE) 500 MG tablet Take 1 tablet (500 mg total) by mouth 2 (two) times daily with a meal.  180 tablet  1  . nitroGLYCERIN (NITROSTAT) 0.4 MG SL tablet Place 1 tablet (0.4 mg total) under the tongue every 5 (five) minutes as needed for chest pain.  50 tablet  3  . nystatin (MYCOSTATIN) powder Apply 1 Units topically daily. Applies under stomach      . pantoprazole (PROTONIX) 40 MG tablet Take 1 tablet (40 mg total) by mouth daily.  90 tablet  1  . PROAIR HFA 108 (90 BASE) MCG/ACT inhaler INHALE 2 PUFFS BY MOUTH EVERY 6 HOURS AS NEEDED FOR WHEEZING  8.5 g  2  . traMADol (ULTRAM) 50 MG tablet Take 1 tablet (50 mg total) by mouth every 6 (six) hours as needed. pain  60 tablet  3  . zolpidem (AMBIEN) 5 MG tablet Take 1 tablet (5 mg total) by mouth at  bedtime as needed. For insomnia  30 tablet  2   No current facility-administered medications for this visit.    Review of Systems Review of Systems  Constitutional: Negative.   HENT: Negative.   Respiratory: Negative.   Cardiovascular: Negative.   Gastrointestinal: Negative.   Neurological: Negative.   All other systems reviewed and are negative.    Blood pressure 132/74, pulse 76, temperature 98.4 F (36.9 C), temperature source Temporal, resp. rate 16, height 5\' 2"  (1.575 m), weight 331 lb (150.141 kg).  Physical Exam Physical Exam  Constitutional: She is oriented to person, place, and time. She appears well-developed and well-nourished.  HENT:  Head: Normocephalic and atraumatic.  Eyes: Conjunctivae and EOM are normal. Pupils are equal, round, and reactive to light.  Neck: Normal range of motion. Neck supple.    Cardiovascular: Normal rate and regular rhythm.   Pulmonary/Chest: Effort normal and breath sounds normal.  Abdominal: Soft. Bowel sounds are normal.  Musculoskeletal: Normal range of motion.  Neurological: She is alert and oriented to person, place, and time.  Skin: Skin is warm and dry.    Data Reviewed none  Assessment    37 year old female with a recurrent posterior neck abscesses.     Plan    1. With the patient back to the operating room for wide local excision of the area of concern. 2. I discussed with her the risks and benefits of the operation to include infection, bleeding, recurrence, damage to structures. The patient voiced understanding and wished to proceed.        Marigene Ehlers., Pippa Hanif 06/19/2013, 12:11 PM

## 2013-06-19 NOTE — Patient Instructions (Addendum)
Take Byetta twice daily prior to meals.   Schedule visit in 3-4 weeks with Pharmacy clinic.   Bring meter to next visit.

## 2013-06-19 NOTE — Telephone Encounter (Signed)
The strips that you ordered for her will not work on the meter you gave her.  She needs the Accu Chek smart view strips.  She uses [employer].

## 2013-06-19 NOTE — Addendum Note (Signed)
Addended by: Kathrin Ruddy on: 06/19/2013 06:38 PM   Modules accepted: Orders

## 2013-06-19 NOTE — Assessment & Plan Note (Signed)
>>  ASSESSMENT AND PLAN FOR INSULIN DEPENDENT TYPE 2 DIABETES MELLITUS ([name]) WRITTEN ON 06/19/2013  6:23 PM BY KOVAL, PETER G, RPH  Diabetes of 13 yrs duration currently under fair and likely improved control of blood glucose based on  . Lab Results  Component Value Date   HGBA1C 13.2 06/07/2013   and more recent improved CBGS. Marland Kitchen Control is suboptimal due to insluin resistance.   She has made significant progress with weight loss.  Denies hypoglycemic events.  Able to verbalize appropriate hypoglycemia management plan. Continued basal insulin Lantus (insulin glargine) and Novlog at this time.  Initiated Byetta BID prior to meals.   Reviewed technique and patient was able to demonstrate and verbalize treatment plan.  Written patient instructions provided.  Follow up in  Pharmacist Clinic Visit in less than 4 weeks to reevaluated and titrate Byetta to higher dose. Total time in face to face counseling .  Patient seen with Oretha Milch, Med Student 3. Marland Kitchen

## 2013-06-19 NOTE — Telephone Encounter (Signed)
NPI number given for surgery of large area on patients back of neck.  Malorie Bigford, Darlyne Russian, CMA

## 2013-06-19 NOTE — Assessment & Plan Note (Signed)
Diabetes of 13 yrs duration currently under fair and likely improved control of blood glucose based on  . Lab Results  Component Value Date   HGBA1C 13.2 06/07/2013   and more recent improved CBGS. Marland Kitchen Control is suboptimal due to insluin resistance.   She has made significant progress with weight loss.  Denies hypoglycemic events.  Able to verbalize appropriate hypoglycemia management plan. Continued basal insulin Lantus (insulin glargine) and Novlog at this time.  Initiated Byetta BID prior to meals.   Reviewed technique and patient was able to demonstrate and verbalize treatment plan.  Written patient instructions provided.  Follow up in  Pharmacist Clinic Visit in less than 4 weeks to reevaluated and titrate Byetta to higher dose. Total time in face to face counseling .  Patient seen with Oretha Milch, Med Student 3. Marland Kitchen

## 2013-06-19 NOTE — Progress Notes (Signed)
S:   Patient arrives in good spirits - walking without any assistance. She has had diabetes since 2001 and has been on insulin since 2007  She reports eating better and walking 5 blocks twice daily which has helped her lose weight.   She reports fasting CBGs 70-100 AND post-prandial CBGs of 125-150.    She would like to weigh < 300 lbs.   Discussed how this is a reasonable goal for the end of the year.   O:     A/P:    Diabetes of 13 yrs duration currently under fair and likely improved control of blood glucose based on  . Lab Results  Component Value Date   HGBA1C 13.2 06/07/2013   and more recent improved CBGS. Marland Kitchen Control is suboptimal due to insluin resistance.   She has made significant progress with weight loss.  Denies hypoglycemic events.  Able to verbalize appropriate hypoglycemia management plan. Continued basal insulin Lantus (insulin glargine) and Novlog at this time.  Initiated Byetta BID prior to meals.   Reviewed technique and patient was able to demonstrate and verbalize treatment plan.  Written patient instructions provided.  Follow up in  Pharmacist Clinic Visit in less than 4 weeks to reevaluated and titrate Byetta to higher dose. Total time in face to face counseling .  Patient seen with Oretha Milch, Med Student 3. Marland Kitchen

## 2013-06-20 NOTE — Progress Notes (Signed)
Patient ID: [name], female   DOB: 07/17/1976, 37 y.o.   MRN: [id number] Reviewed: Agree with Dr. Macky Lower documentation and management.

## 2013-06-25 ENCOUNTER — Encounter: Payer: Self-pay | Admitting: Family Medicine

## 2013-06-25 ENCOUNTER — Ambulatory Visit (INDEPENDENT_AMBULATORY_CARE_PROVIDER_SITE_OTHER): Payer: Medicaid Other | Admitting: Family Medicine

## 2013-06-25 VITALS — BP 147/91 | HR 97 | Ht 62.0 in | Wt 334.0 lb

## 2013-06-25 DIAGNOSIS — G8929 Other chronic pain: Secondary | ICD-10-CM

## 2013-06-25 DIAGNOSIS — Z7189 Other specified counseling: Secondary | ICD-10-CM

## 2013-06-25 MED ORDER — CYCLOBENZAPRINE HCL 10 MG PO TABS: 10.0000 mg | ORAL_TABLET | Freq: Two times a day (BID) | ORAL | Status: DC | PRN

## 2013-06-25 MED ORDER — TRAMADOL HCL 50 MG PO TABS: 50.0000 mg | ORAL_TABLET | Freq: Every day | ORAL | Status: DC | PRN

## 2013-06-25 MED ORDER — HYDROCODONE-ACETAMINOPHEN 10-325 MG PO TABS: 1.0000 | ORAL_TABLET | Freq: Three times a day (TID) | ORAL | Status: DC | PRN

## 2013-06-25 MED ORDER — HYDROCODONE-ACETAMINOPHEN 10-325 MG PO TABS
1.0000 | ORAL_TABLET | Freq: Three times a day (TID) | ORAL | Status: DC | PRN
Start: 2013-07-26 — End: 2013-08-13

## 2013-06-25 NOTE — Patient Instructions (Signed)
Thank you for coming in today. I have printed off prescriptions for your chronic pain medications.  Keep your upcoming appointment with Dr. Raymondo Band in August. Come back to see me in September.  Call if you have any questions.  Be well, Dr. Pollie Meyer

## 2013-06-25 NOTE — Progress Notes (Signed)
Patient ID: [name], female   DOB: Jun 13, 1976, 37 y.o.   MRN: [id number]    HPI:  Patient reports to establish pain medicine contract. She has been receiving prescriptions for hydrocodone-acetaminophen, tramadol, and flexeril from our clinic and has recently become my new primary patient.  She reports her pain is primarily in her knees and lower back. She says she has never had any imaging of her knees or back. She did physical therapy in 2010 for 6 months for her knees and says this did not help. She has had one prior injection in one of her knees also in 2010 but this did not help. Has not been on other pain medications in the past.   Current medications: 1. Hydrocodone-acetaminophen 10-325mg  tablet - has been on for about one year. Takes one tablet 1-3 times per day. Denies problems such as constipation or sleepiness. Has not noticed any side effects. She uses the hydrocodone for when she walks, which she does 3 times per day. She is walking to lose weight and has been successful thus far. 2. Tramadol - takes this once per day for her low back pain. Denies any hx of seizure disorder. Has been on this medication longer than she has been on the hydrocodone. 3. Flexeril - says she has not been on this very long. She takes it twice per day.  Pain scale rating at its BEST in the past 24 hours (1 to 10): 3-4 Pain scale rating at its WORST in the past 24 hours (1 to 10): 7 Pain gets up to a 10 after walking. Denies alcohol use. Has used marijuana in the past but has not used in six years. Denies being on any other controlled substances. Only gets her medications from our clinic. Will be out of her medications on Friday.  Controlled substance database reviewed. Patient has been getting hydrocodone-acetaminophen 10-325 #45 filled every 15 days, which is appropriate as this is a 15 day supply. She has gotten two rx's of oxycodone-acetaminophen filled on 05/11/13 and 05/25/13, which were each a 5 day  supply and which were prescribed by our clinic (I see the prescription record in our EMR), each for an acute pain issue with recurrent neck abscess. She also has [place] 5mg  #30 filled on a monthly basis through [facility] in [place], [place], which pt states is a mail order pharmacy through which she gets most of her medications filled. All of her other rx's have come from [facility] on [address] in [place], except the oxycodone-acet that was filled on 05/11/2013, which was filled at [facility].  ROS: See HPI. +pain in knees and back  PMFSH: hx uncontrolled DM  PHYSICAL EXAM: BP 147/91  Pulse 97  Ht 5\' 2"  (1.575 m)  Wt 334 lb (151.501 kg)  BMI 61.07 kg/m2 Gen: NAD Heart: RRR Lungs: CTAB Neuro: grossly nonfocal, speech intact Ext: bilateral knees with crepitus present with flexion and extension. Mildly tender to palpation of anterior knees. Back: mildly TTP over lower lumbar paraspinous muscles  ASSESSMENT/PLAN: - follow up in August for pharmacy clinic, September with me - pt reports some difficulty obtaining her test strips, says medicaid will not cover the ones that were sent in recently. Will attempt to send in new rx.

## 2013-06-27 ENCOUNTER — Telehealth (INDEPENDENT_AMBULATORY_CARE_PROVIDER_SITE_OTHER): Payer: Self-pay

## 2013-06-27 ENCOUNTER — Other Ambulatory Visit (INDEPENDENT_AMBULATORY_CARE_PROVIDER_SITE_OTHER): Payer: Self-pay

## 2013-06-27 ENCOUNTER — Encounter (HOSPITAL_COMMUNITY)
Admission: RE | Admit: 2013-06-27 | Discharge: 2013-06-27 | Disposition: A | Discharge status: Home / Self Care | Payer: Medicaid Other | Source: Ambulatory Visit | Attending: General Surgery | Admitting: General Surgery

## 2013-06-27 ENCOUNTER — Telehealth: Payer: Self-pay | Admitting: Family Medicine

## 2013-06-27 ENCOUNTER — Encounter (HOSPITAL_COMMUNITY): Payer: Self-pay

## 2013-06-27 DIAGNOSIS — L0211 Cutaneous abscess of neck: Secondary | ICD-10-CM

## 2013-06-27 LAB — BASIC METABOLIC PANEL
Calcium: 9.8 mg/dL (ref 8.4–10.5)
Creatinine, Ser: 0.34 mg/dL — ABNORMAL LOW (ref 0.50–1.10)
GFR calc Af Amer: >90 mL/min (ref >90)
GFR calc non Af Amer: >90 mL/min (ref >90)
Sodium: 132 mEq/L — ABNORMAL LOW (ref 135–145)

## 2013-06-27 LAB — CBC
Platelets: 318 10*3/uL (ref 150–400)
RBC: 4.6 MIL/uL (ref 3.87–5.11)
RDW: 12.7 % (ref 11.5–15.5)
WBC: 11.5 10*3/uL — ABNORMAL HIGH (ref 4.0–10.5)

## 2013-06-27 LAB — HCG, SERUM, QUALITATIVE: Preg, Serum: NEGATIVE

## 2013-06-27 MED ORDER — CHLORHEXIDINE GLUCONATE 4 % EX LIQD: 1.0000 "application " | Freq: Once | CUTANEOUS | Status: DC

## 2013-06-27 MED ORDER — SULFAMETHOXAZOLE-TMP DS 800-160 MG PO TABS: 1.0000 | ORAL_TABLET | Freq: Two times a day (BID) | ORAL | Status: DC

## 2013-06-27 NOTE — Telephone Encounter (Signed)
Call returned to pt. Had placed call to surgeon who is operating on Friday and he called in antibiotics - no further concerns noted. Wyatt Haste, RN-BSN

## 2013-06-27 NOTE — Telephone Encounter (Signed)
Patient has an reoccurring abscess on the back of her neck. She states it is red and swollen and has "heat". It has not bursted yet. Would like to know if she needs an appt or if antibiotics could be called in.

## 2013-06-27 NOTE — Pre-Procedure Instructions (Signed)
[name]  06/27/2013   Your procedure is scheduled on:  Friday June 29, 2013  Report to [facility] at 0945 AM.  Call this number if you have problems the morning of surgery: [phone]   Remember:   Do not eat food or drink liquids after midnight.   Take these medicines the morning of surgery with A SIP OF WATER: Coreg, Hydrocodone-acetaminophen, and use inhalers and bring day of surgery.   Do not wear jewelry, make-up or nail polish.  Do not wear lotions, powders, or perfumes. You may wear deodorant.  Do not shave 48 hours prior to surgery.   Do not bring valuables to the hospital.  [facility] is not responsible                   for any belongings or valuables.  Contacts, dentures or bridgework may not be worn into surgery.  Leave suitcase in the car. After surgery it may be brought to your room.  For patients admitted to the hospital, checkout time is 11:00 AM the day of  discharge.   Patients discharged the day of surgery will not be allowed to drive  home.  Name and phone number of your driver: Rhonda  Special Instructions: Shower using CHG 2 nights before surgery and the night before surgery.  If you shower the day of surgery use CHG.  Use special wash - you have one bottle of CHG for all showers.  You should use approximately 1/3 of the bottle for each shower.   Please read over the following fact sheets that you were given: Pain Booklet, Coughing and Deep Breathing and Surgical Site Infection Prevention  

## 2013-06-27 NOTE — Pre-Procedure Instructions (Signed)
SHANICQUA COLDREN  06/27/2013   Your procedure is scheduled on:  Friday June 29, 2013  Report to [name] at 0945 AM.  Call this number if you have problems the morning of surgery: [phone]   Remember:   Do not eat food or drink liquids after midnight.   Take these medicines the morning of surgery with A SIP OF WATER: Coreg, Hydrocodone-acetaminophen, and use inhalers and bring day of surgery.   Do not wear jewelry, make-up or nail polish.  Do not wear lotions, powders, or perfumes. You may wear deodorant.  Do not shave 48 hours prior to surgery.   Do not bring valuables to the hospital.  [facility] is not responsible                   for any belongings or valuables.  Contacts, dentures or bridgework may not be worn into surgery.  Leave suitcase in the car. After surgery it may be brought to your room.  For patients admitted to the hospital, checkout time is 11:00 AM the day of  discharge.   Patients discharged the day of surgery will not be allowed to drive  home.  Name and phone number of your driver: Bjorn Loser  Special Instructions: Shower using CHG 2 nights before surgery and the night before surgery.  If you shower the day of surgery use CHG.  Use special wash - you have one bottle of CHG for all showers.  You should use approximately 1/3 of the bottle for each shower.   Please read over the following fact sheets that you were given: Pain Booklet, Coughing and Deep Breathing and Surgical Site Infection Prevention

## 2013-06-27 NOTE — Telephone Encounter (Signed)
She located sleep study on this patient and will fax to Dr. Derrell Lolling.

## 2013-06-27 NOTE — Telephone Encounter (Signed)
Patient called in stating she is scheduled for neck abscess excision on Friday. Abscess is now red, swollen and warm to touch. No drainage from abscess. Paged Dr Derrell Lolling and he ordered Bactrim DS and wants to keep surgery as scheduled. Bactrim called into [facility] on [address]. Patient told to go ahead and keep pre op appt for today and keep surgery scheduled for Friday.

## 2013-06-28 ENCOUNTER — Telehealth (INDEPENDENT_AMBULATORY_CARE_PROVIDER_SITE_OTHER): Payer: Self-pay | Admitting: *Deleted

## 2013-06-28 MED ORDER — DEXTROSE 5 % IV SOLN
3.0000 g | INTRAVENOUS | Status: AC
  Administered 2013-06-29: 3 g via INTRAVENOUS
  Filled 2013-06-28: qty 3000

## 2013-06-28 NOTE — Telephone Encounter (Signed)
Revonda Standard PA called to make sure Dr. Derrell Lolling is aware of the elevated CBG of 403 on 06/27/13.

## 2013-06-28 NOTE — Telephone Encounter (Signed)
Called AR on his cell to make him aware because today is his afternoon off...he verbalized agreement

## 2013-06-28 NOTE — Progress Notes (Signed)
Anesthesia chart review:  Patient is a 37 year old female scheduled for wide local excision of posterior neck abscess on 06/29/13 by Dr. Derrell Lolling.  History includes morbid obesity (BMI 60), former smoker, asthma, HTN, GERD, depression, Bipolar disorder, DM2, arthritis, HLD, anemia, COPD, OSA with CPAP use, history of chest pain with widely patent coronaries by 2013 cath. PCP is with [facility].  EKG on 06/27/13 showed ST @ 106 bpm, poor r wave progression, cannot rule out anterior infarct (age undetermined).    Cardiac cath on 07/27/12 showed: 1. Widely patent coronary arteries.  2. Normal LV systolic function, EF 55-65%, no significant MR.  3. Elevated LVEDP.  She had an unremarkable echo in 2004.  CXR on 06/27/13 showed: 1. No acute cardiopulmonary abnormalities.  2. Asymmetric elevation of the right hemidiaphragm.  Preoperative labs noted.  Non-fasting glucose was 403.  WBC 11.5.  I called and spoke with patient, she reports that she had just eaten before labs.  She tells me her fasting glucose is 80-100.  She does have a neck abscess and is on Bactrim, so this could also be affecting her glucose control--although historically she has not had well controlled diabetes.  I have called glucose results to Triage CCS RN Haig Prophet, so Dr. Derrell Lolling can review review and make further recommendations.  She will get a fasting CBG on arrival.  If a glucose is reasonable then I would anticipate that she could proceed as planned.  If it is significantly elevated then surgeon and anesthesiologist will need to discuss treatment of hyperglcemia and urgency of procedure.  I have discussed this with the patient as well.  Currently, she is not scheduled for a first case.  Velna [facility]/Anesthesiology Phone [phone] 06/28/2013 1:46 PM

## 2013-06-29 ENCOUNTER — Encounter (HOSPITAL_COMMUNITY): Payer: Self-pay | Admitting: Anesthesiology

## 2013-06-29 ENCOUNTER — Encounter (HOSPITAL_COMMUNITY): Payer: Self-pay | Admitting: Vascular Surgery

## 2013-06-29 ENCOUNTER — Ambulatory Visit (HOSPITAL_COMMUNITY): Payer: Medicaid Other | Admitting: Anesthesiology

## 2013-06-29 ENCOUNTER — Telehealth: Payer: Self-pay | Admitting: Family Medicine

## 2013-06-29 ENCOUNTER — Ambulatory Visit (HOSPITAL_COMMUNITY)
Admission: RE | Admit: 2013-06-29 | Discharge: 2013-06-29 | Disposition: A | Discharge status: Home / Self Care | Payer: Medicaid Other | Source: Ambulatory Visit | Attending: General Surgery | Admitting: General Surgery

## 2013-06-29 ENCOUNTER — Encounter (HOSPITAL_COMMUNITY)
Admission: RE | Disposition: A | Discharge status: Home / Self Care | Payer: Self-pay | Source: Ambulatory Visit | Attending: General Surgery

## 2013-06-29 DIAGNOSIS — L03221 Cellulitis of neck: Secondary | ICD-10-CM

## 2013-06-29 DIAGNOSIS — E785 Hyperlipidemia, unspecified: Secondary | ICD-10-CM | POA: Insufficient documentation

## 2013-06-29 DIAGNOSIS — J4489 Other specified chronic obstructive pulmonary disease: Secondary | ICD-10-CM | POA: Insufficient documentation

## 2013-06-29 DIAGNOSIS — L0211 Cutaneous abscess of neck: Secondary | ICD-10-CM

## 2013-06-29 DIAGNOSIS — Z79899 Other long term (current) drug therapy: Secondary | ICD-10-CM | POA: Insufficient documentation

## 2013-06-29 DIAGNOSIS — Z87891 Personal history of nicotine dependence: Secondary | ICD-10-CM | POA: Insufficient documentation

## 2013-06-29 DIAGNOSIS — J449 Chronic obstructive pulmonary disease, unspecified: Secondary | ICD-10-CM | POA: Insufficient documentation

## 2013-06-29 DIAGNOSIS — Z888 Allergy status to other drugs, medicaments and biological substances status: Secondary | ICD-10-CM | POA: Insufficient documentation

## 2013-06-29 DIAGNOSIS — Z8249 Family history of ischemic heart disease and other diseases of the circulatory system: Secondary | ICD-10-CM | POA: Insufficient documentation

## 2013-06-29 DIAGNOSIS — D649 Anemia, unspecified: Secondary | ICD-10-CM | POA: Insufficient documentation

## 2013-06-29 DIAGNOSIS — G4733 Obstructive sleep apnea (adult) (pediatric): Secondary | ICD-10-CM | POA: Insufficient documentation

## 2013-06-29 DIAGNOSIS — F3189 Other bipolar disorder: Secondary | ICD-10-CM | POA: Insufficient documentation

## 2013-06-29 DIAGNOSIS — E119 Type 2 diabetes mellitus without complications: Secondary | ICD-10-CM | POA: Insufficient documentation

## 2013-06-29 DIAGNOSIS — I10 Essential (primary) hypertension: Secondary | ICD-10-CM | POA: Insufficient documentation

## 2013-06-29 DIAGNOSIS — K219 Gastro-esophageal reflux disease without esophagitis: Secondary | ICD-10-CM | POA: Insufficient documentation

## 2013-06-29 DIAGNOSIS — Z7982 Long term (current) use of aspirin: Secondary | ICD-10-CM | POA: Insufficient documentation

## 2013-06-29 DIAGNOSIS — Z794 Long term (current) use of insulin: Secondary | ICD-10-CM | POA: Insufficient documentation

## 2013-06-29 DIAGNOSIS — M129 Arthropathy, unspecified: Secondary | ICD-10-CM | POA: Insufficient documentation

## 2013-06-29 HISTORY — PX: MASS EXCISION: SHX2000

## 2013-06-29 LAB — GLUCOSE, CAPILLARY: Glucose-Capillary: 350 mg/dL — ABNORMAL HIGH (ref 70–99)

## 2013-06-29 SURGERY — EXCISION MASS
Anesthesia: General | Site: Neck | Wound class: Dirty or Infected

## 2013-06-29 MED ORDER — MORPHINE SULFATE 2 MG/ML IJ SOLN
INTRAMUSCULAR | Status: AC
  Filled 2013-06-29: qty 1

## 2013-06-29 MED ORDER — LIDOCAINE HCL (CARDIAC) 20 MG/ML IV SOLN
INTRAVENOUS | Status: DC | PRN
  Administered 2013-06-29: 100 mg via INTRAVENOUS

## 2013-06-29 MED ORDER — OXYCODONE HCL 5 MG PO TABS: 5.0000 mg | ORAL_TABLET | ORAL | Status: DC | PRN

## 2013-06-29 MED ORDER — MORPHINE SULFATE 2 MG/ML IJ SOLN: 1.0000 mg | INTRAMUSCULAR | Status: DC | PRN

## 2013-06-29 MED ORDER — OXYCODONE HCL 5 MG PO TABS
ORAL_TABLET | ORAL | Status: AC
  Filled 2013-06-29: qty 1

## 2013-06-29 MED ORDER — LACTATED RINGERS IV SOLN
INTRAVENOUS | Status: DC | PRN
  Administered 2013-06-29 (×2): via INTRAVENOUS

## 2013-06-29 MED ORDER — ARTIFICIAL TEARS OP OINT
TOPICAL_OINTMENT | OPHTHALMIC | Status: DC | PRN
  Administered 2013-06-29: 1 via OPHTHALMIC

## 2013-06-29 MED ORDER — FENTANYL CITRATE 0.05 MG/ML IJ SOLN
INTRAMUSCULAR | Status: DC | PRN
  Administered 2013-06-29: 50 ug via INTRAVENOUS
  Administered 2013-06-29: 150 ug via INTRAVENOUS

## 2013-06-29 MED ORDER — MIDAZOLAM HCL 5 MG/5ML IJ SOLN
INTRAMUSCULAR | Status: DC | PRN
  Administered 2013-06-29: 1 mg via INTRAVENOUS

## 2013-06-29 MED ORDER — OXYCODONE HCL 5 MG PO TABS
5.0000 mg | ORAL_TABLET | Freq: Once | ORAL | Status: AC | PRN
  Administered 2013-06-29: 5 mg via ORAL

## 2013-06-29 MED ORDER — ONDANSETRON HCL 4 MG/2ML IJ SOLN
INTRAMUSCULAR | Status: DC | PRN
  Administered 2013-06-29: 4 mg via INTRAVENOUS

## 2013-06-29 MED ORDER — ONDANSETRON HCL 4 MG/2ML IJ SOLN
INTRAMUSCULAR | Status: AC
  Filled 2013-06-29: qty 2

## 2013-06-29 MED ORDER — ONDANSETRON HCL 4 MG/2ML IJ SOLN
4.0000 mg | Freq: Four times a day (QID) | INTRAMUSCULAR | Status: DC | PRN
  Administered 2013-06-29: 4 mg via INTRAVENOUS
  Filled 2013-06-29: qty 2

## 2013-06-29 MED ORDER — BUPIVACAINE-EPINEPHRINE PF 0.25-1:200000 % IJ SOLN
INTRAMUSCULAR | Status: AC
  Filled 2013-06-29: qty 30

## 2013-06-29 MED ORDER — INSULIN ASPART 100 UNIT/ML ~~LOC~~ SOLN
10.0000 [IU] | Freq: Once | SUBCUTANEOUS | Status: AC
  Administered 2013-06-29: 10 [IU] via SUBCUTANEOUS

## 2013-06-29 MED ORDER — LACTATED RINGERS IV SOLN
Freq: Once | INTRAVENOUS | Status: AC
  Administered 2013-06-29: 10:00:00 via INTRAVENOUS

## 2013-06-29 MED ORDER — PROMETHAZINE HCL 25 MG/ML IJ SOLN: 6.2500 mg | INTRAMUSCULAR | Status: DC | PRN

## 2013-06-29 MED ORDER — 0.9 % SODIUM CHLORIDE (POUR BTL) OPTIME
TOPICAL | Status: DC | PRN
  Administered 2013-06-29: 1000 mL

## 2013-06-29 MED ORDER — SUCCINYLCHOLINE CHLORIDE 20 MG/ML IJ SOLN
INTRAMUSCULAR | Status: DC | PRN
  Administered 2013-06-29: 120 mg via INTRAVENOUS

## 2013-06-29 MED ORDER — OXYCODONE HCL 5 MG/5ML PO SOLN: 5.0000 mg | Freq: Once | ORAL | Status: AC | PRN

## 2013-06-29 MED ORDER — PROPOFOL 10 MG/ML IV BOLUS
INTRAVENOUS | Status: DC | PRN
  Administered 2013-06-29: 200 mg via INTRAVENOUS

## 2013-06-29 MED ORDER — BUPIVACAINE-EPINEPHRINE 0.25% -1:200000 IJ SOLN
INTRAMUSCULAR | Status: DC | PRN
  Administered 2013-06-29: 20 mL

## 2013-06-29 MED ORDER — OXYCODONE-ACETAMINOPHEN 10-325 MG PO TABS: 1.0000 | ORAL_TABLET | ORAL | Status: DC | PRN

## 2013-06-29 SURGICAL SUPPLY — 39 items
BLADE SURG 10 STRL SS (BLADE) ×2 IMPLANT
BLADE SURG 15 STRL LF DISP TIS (BLADE) ×1 IMPLANT
BLADE SURG 15 STRL SS (BLADE) ×2
BLADE SURG ROTATE 9660 (MISCELLANEOUS) IMPLANT
CLOTH BEACON ORANGE TIMEOUT ST (SAFETY) ×2 IMPLANT
COVER SURGICAL LIGHT HANDLE (MISCELLANEOUS) ×2 IMPLANT
DRAPE ORTHO SPLIT 77X108 STRL (DRAPES)
DRAPE PED LAPAROTOMY (DRAPES) IMPLANT
DRAPE SURG ORHT 6 SPLT 77X108 (DRAPES) IMPLANT
ELECT CAUTERY BLADE 6.4 (BLADE) ×2 IMPLANT
ELECT REM PT RETURN 9FT ADLT (ELECTROSURGICAL) ×2
ELECTRODE REM PT RTRN 9FT ADLT (ELECTROSURGICAL) ×1 IMPLANT
GAUZE PACKING IODOFORM 1/2 (PACKING) ×1 IMPLANT
GLOVE BIO SURGEON STRL SZ7.5 (GLOVE) ×2 IMPLANT
GLOVE BIOGEL PI IND STRL 8 (GLOVE) ×1 IMPLANT
GLOVE BIOGEL PI INDICATOR 8 (GLOVE) ×1
GOWN STRL NON-REIN LRG LVL3 (GOWN DISPOSABLE) ×2 IMPLANT
GOWN STRL REIN XL XLG (GOWN DISPOSABLE) ×2 IMPLANT
KIT BASIN OR (CUSTOM PROCEDURE TRAY) ×2 IMPLANT
KIT ROOM TURNOVER OR (KITS) ×2 IMPLANT
NDL HYPO 25GX1X1/2 BEV (NEEDLE) ×1 IMPLANT
NEEDLE HYPO 25GX1X1/2 BEV (NEEDLE) ×2 IMPLANT
NS IRRIG 1000ML POUR BTL (IV SOLUTION) ×2 IMPLANT
PACK SURGICAL SETUP 50X90 (CUSTOM PROCEDURE TRAY) ×2 IMPLANT
PAD ARMBOARD 7.5X6 YLW CONV (MISCELLANEOUS) ×2 IMPLANT
PENCIL BUTTON HOLSTER BLD 10FT (ELECTRODE) ×2 IMPLANT
SPECIMEN JAR SMALL (MISCELLANEOUS) ×2 IMPLANT
SPONGE GAUZE 4X4 12PLY (GAUZE/BANDAGES/DRESSINGS) ×1 IMPLANT
SPONGE LAP 18X18 X RAY DECT (DISPOSABLE) ×2 IMPLANT
SWAB COLLECTION DEVICE MRSA (MISCELLANEOUS) ×1 IMPLANT
SYR BULB 3OZ (MISCELLANEOUS) ×2 IMPLANT
SYR CONTROL 10ML LL (SYRINGE) ×2 IMPLANT
TAPE CLOTH SURG 6X10 WHT LF (GAUZE/BANDAGES/DRESSINGS) ×1 IMPLANT
TOWEL OR 17X24 6PK STRL BLUE (TOWEL DISPOSABLE) ×2 IMPLANT
TOWEL OR 17X26 10 PK STRL BLUE (TOWEL DISPOSABLE) ×2 IMPLANT
TUBE ANAEROBIC SPECIMEN COL (MISCELLANEOUS) ×1 IMPLANT
TUBE CONNECTING 12X1/4 (SUCTIONS) ×1 IMPLANT
UNDERPAD 30X30 INCONTINENT (UNDERPADS AND DIAPERS) IMPLANT
YANKAUER SUCT BULB TIP NO VENT (SUCTIONS) ×1 IMPLANT

## 2013-06-29 NOTE — Anesthesia Procedure Notes (Signed)
Procedure Name: Intubation Date/Time: 06/29/2013 12:14 PM Performed by: Lovie Chol Pre-anesthesia Checklist: Patient identified, Emergency Drugs available, Suction available, Patient being monitored and Timeout performed Patient Re-evaluated:Patient Re-evaluated prior to inductionOxygen Delivery Method: Circle system utilized Preoxygenation: Pre-oxygenation with 100% oxygen Intubation Type: IV induction Ventilation: Mask ventilation without difficulty and Oral airway inserted - appropriate to patient size Grade View: Grade II Tube type: Oral Tube size: 7.5 mm Number of attempts: 1 Airway Equipment and Method: Stylet and Video-laryngoscopy Placement Confirmation: ETT inserted through vocal cords under direct vision,  positive ETCO2,  CO2 detector and breath sounds checked- equal and bilateral Secured at: 22 cm Tube secured with: Tape Dental Injury: Teeth and Oropharynx as per pre-operative assessment

## 2013-06-29 NOTE — Telephone Encounter (Signed)
Thanks. Patient is just letting me know as part of the terms of her chronic pain contract.   Red team, please call her and let her know she needs to use caution and not duplicate narcotic medications. She should not take the percocet and her hydrocodone at the same time, due to both concerns for her liver from the tylenol component, and from sedation from the narcotic. She should take only either percocet, or hydrocodone, but not both on any given day.

## 2013-06-29 NOTE — Progress Notes (Signed)
Dr. Jacklynn Bue called about CBG 322.  Orders for 10 units of Novolog SQ x1 given.

## 2013-06-29 NOTE — Telephone Encounter (Signed)
Called pt. LMVM to call back. .[name]  

## 2013-06-29 NOTE — Op Note (Signed)
Pre Operative Diagnosis:  Neck abscess  Post Operative Diagnosis: same  Procedure: I&D of posterior neck abscess  Surgeon: Dr. Axel Filler  Assistant: none  Anesthesia: GETA  EBL: 10cc  Complications: none  Counts: reported as correct x 2  Findings:  A 3x4cm pocket of purulence  Indications for procedure:  PT is a 37 y/o F with a mult occurrence of a posterior neck abscess.  She was seen in clinic and decided to have this area D&I.  Upon the week of her surgery she had another infection occur in the same area.    Details of the procedure:The patient was taken back to the operating room. The patient was placed in supine position with bilateral SCDs in place. After appropriate anitbiotics were confirmed, a time-out was confirmed and all facts were verified.  AN ellipitical incision was made at the area of greatest fluctuance and a pocket pus was encountered.  Anaerobic and Aerobic cultures were taken.  I irrigagted the wound with sterile saline.  The wound was hemostatic.  I packed the wound with 1/2" Iodoform guaze.  The wound was dressed with 4x4s and and ABD pad.  The patient was taken to the recovery room in stable condition.

## 2013-06-29 NOTE — H&P (View-Only) (Signed)
Patient ID: [name], female   DOB: 04/04/1976, 37 y.o.   MRN: [id number]  Chief Complaint  Patient presents with  . Follow-up    reck abscess    HPI [name] is a 37 y.o. female.  The patient is a 7-year-old female with a recurrent posterior neck abscess. The patient then having approximately 5 episodes over the last 2 years.impression is recurrent same spot.. She does wear a CPAP mask because of this potentially causes some of this irritation to  HPI  Past Medical History  Diagnosis Date  . Morbid obesity   . Alveolar hypoventilation   . Asthma   . GERD (gastroesophageal reflux disease)   . Rectal fissure   . Hypertension   . Costochondritis   . Cellulitis 08/2010-08/2011  . Depression   . Bipolar 2 disorder   . Obstructive sleep apnea   . Chest pain     NO CAD by cath 07/27/12, nl LV systolic fxn  . Diabetes mellitus 2000    Type 2, Uncontrolled  . Arthritis   . HLD (hyperlipidemia)   . Anemia   . COPD (chronic obstructive pulmonary disease)     Past Surgical History  Procedure Laterality Date  . Carpal tunnel release    . Bil knee surgery      Family History  Problem Relation Age of Onset  . Diabetes Mother   . Hyperlipidemia Mother   . Depression Mother   . Heart attack Paternal Uncle   . Heart disease Paternal Grandmother   . Heart attack Paternal Grandmother   . Heart attack Paternal Grandfather   . Heart disease Paternal Grandfather     Social History History  Substance Use Topics  . Smoking status: Former Smoker -- 0.30 packs/day for .3 years    Types: Cigarettes    Quit date: 12/06/1993  . Smokeless tobacco: Never Used  . Alcohol Use: No    Allergies  Allergen Reactions  . Kiwi Extract Anaphylaxis  . Nubain (Nalbuphine Hcl) Other (See Comments)    Nervous...makes her feel like something is crawling on her.    Current Outpatient Prescriptions  Medication Sig Dispense Refill  . aspirin EC 81 MG EC tablet Take 1 tablet (81 mg total)  by mouth daily.      . atorvastatin (LIPITOR) 20 MG tablet Take 1 tablet (20 mg total) by mouth daily.  90 tablet  3  . budesonide-formoterol (SYMBICORT) 160-4.5 MCG/ACT inhaler Inhale 2 puffs into the lungs 2 (two) times daily.      . carvedilol (COREG) 12.5 MG tablet Take 1 tablet (12.5 mg total) by mouth 2 (two) times daily with a meal.  60 tablet  6  . cetirizine (ZYRTEC) 10 MG tablet Take 10 mg by mouth daily as needed for allergies.      . cyclobenzaprine (FLEXERIL) 10 MG tablet Take 10 mg by mouth 3 (three) times daily as needed for muscle spasms.      . glucose blood (ACCU-CHEK AVIVA) test strip Check blood sugars  before and after meals and at bedtime.  200 each  2  . HYDROcodone-acetaminophen (NORCO) 10-325 MG per tablet Take 1 tablet by mouth every 8 (eight) hours as needed for pain.  45 tablet  3  . insulin aspart (NOVOLOG) 100 UNIT/ML injection Inject 50 Units into the skin 3 (three) times daily before meals.  1 vial  12  . insulin glargine (LANTUS SOLOSTAR) 100 UNIT/ML injection Inject 115 Units into the skin 2 (  two) times daily.  5 pen  15  . lisinopril (PRINIVIL,ZESTRIL) 10 MG tablet Take 10 mg by mouth daily.        . metFORMIN (GLUCOPHAGE) 500 MG tablet Take 1 tablet (500 mg total) by mouth 2 (two) times daily with a meal.  180 tablet  1  . nitroGLYCERIN (NITROSTAT) 0.4 MG SL tablet Place 1 tablet (0.4 mg total) under the tongue every 5 (five) minutes as needed for chest pain.  50 tablet  3  . nystatin (MYCOSTATIN) powder Apply 1 Units topically daily. Applies under stomach      . pantoprazole (PROTONIX) 40 MG tablet Take 1 tablet (40 mg total) by mouth daily.  90 tablet  1  . PROAIR HFA 108 (90 BASE) MCG/ACT inhaler INHALE 2 PUFFS BY MOUTH EVERY 6 HOURS AS NEEDED FOR WHEEZING  8.5 g  2  . traMADol (ULTRAM) 50 MG tablet Take 1 tablet (50 mg total) by mouth every 6 (six) hours as needed. pain  60 tablet  3  . zolpidem (AMBIEN) 5 MG tablet Take 1 tablet (5 mg total) by mouth at  bedtime as needed. For insomnia  30 tablet  2   No current facility-administered medications for this visit.    Review of Systems Review of Systems  Constitutional: Negative.   HENT: Negative.   Respiratory: Negative.   Cardiovascular: Negative.   Gastrointestinal: Negative.   Neurological: Negative.   All other systems reviewed and are negative.    Blood pressure 132/74, pulse 76, temperature 98.4 F (36.9 C), temperature source Temporal, resp. rate 16, height 5' 2" (1.575 m), weight 331 lb (150.141 kg).  Physical Exam Physical Exam  Constitutional: She is oriented to person, place, and time. She appears well-developed and well-nourished.  HENT:  Head: Normocephalic and atraumatic.  Eyes: Conjunctivae and EOM are normal. Pupils are equal, round, and reactive to light.  Neck: Normal range of motion. Neck supple.    Cardiovascular: Normal rate and regular rhythm.   Pulmonary/Chest: Effort normal and breath sounds normal.  Abdominal: Soft. Bowel sounds are normal.  Musculoskeletal: Normal range of motion.  Neurological: She is alert and oriented to person, place, and time.  Skin: Skin is warm and dry.    Data Reviewed none  Assessment    37-year-old female with a recurrent posterior neck abscesses.     Plan    1. With the patient back to the operating room for wide local excision of the area of concern. 2. I discussed with her the risks and benefits of the operation to include infection, bleeding, recurrence, damage to structures. The patient voiced understanding and wished to proceed.        Rasheed Welty Jr., Eveny Anastas 06/19/2013, 12:11 PM    

## 2013-06-29 NOTE — Telephone Encounter (Signed)
Will fwd to MD.  Selso Mannor L, CMA  

## 2013-06-29 NOTE — Anesthesia Preprocedure Evaluation (Addendum)
Anesthesia Evaluation  Patient identified by MRN, date of birth, ID band Patient awake    Reviewed: Allergy & Precautions, H&P , NPO status , Patient's Chart, lab work & pertinent test results, reviewed documented beta blocker date and time   History of Anesthesia Complications Negative for: history of anesthetic complications  Airway Mallampati: III  Neck ROM: Limited    Dental  (+) Teeth Intact and Dental Advisory Given   Pulmonary asthma , sleep apnea and Continuous Positive Airway Pressure Ventilation , COPD COPD inhaler,    + decreased breath sounds      Cardiovascular hypertension, Pt. on medications and Pt. on home beta blockers + angina Rhythm:Regular Rate:Normal  Cardiac cath 2013 - "clear per patient"   Neuro/Psych  Neuromuscular disease    GI/Hepatic Neg liver ROS, GERD-  Medicated and Controlled,  Endo/Other  diabetes, Type 2, Oral Hypoglycemic Agents and Insulin DependentMorbid obesity  Renal/GU negative Renal ROS     Musculoskeletal negative musculoskeletal ROS (+)   Abdominal (+) + obese,   Peds  Hematology negative hematology ROS (+)   Anesthesia Other Findings   Reproductive/Obstetrics negative OB ROS                          Anesthesia Physical Anesthesia Plan  ASA: IV  Anesthesia Plan: General   Post-op Pain Management:    Induction: Intravenous  Airway Management Planned: Oral ETT and Video Laryngoscope Planned  Additional Equipment:   Intra-op Plan:   Post-operative Plan: Extubation in OR and Possible Post-op intubation/ventilation  Informed Consent: I have reviewed the patients History and Physical, chart, labs and discussed the procedure including the risks, benefits and alternatives for the proposed anesthesia with the patient or authorized representative who has indicated his/her understanding and acceptance.   Dental advisory given  Plan Discussed with:  CRNA and Surgeon  Anesthesia Plan Comments:         Anesthesia Quick Evaluation

## 2013-06-29 NOTE — Preoperative (Signed)
Beta Blockers   Reason not to administer Beta Blockers:Not Applicable 

## 2013-06-29 NOTE — Anesthesia Postprocedure Evaluation (Signed)
  Anesthesia Post-op Note  Patient: [name]  Procedure(s) Performed: Procedure(s):  WIDE LOCAL EXCISION OF POSTERIOR NECK ABSCESS (N/A)  Patient Location: PACU  Anesthesia Type:General  Level of Consciousness: awake and alert   Airway and Oxygen Therapy: Patient Spontanous Breathing  Post-op Pain: mild  Post-op Assessment: Post-op Vital signs reviewed  Post-op Vital Signs: Reviewed and stable  Complications: No apparent anesthesia complications

## 2013-06-29 NOTE — Transfer of Care (Signed)
Immediate Anesthesia Transfer of Care Note  Patient: [name]  Procedure(s) Performed: Procedure(s):  WIDE LOCAL EXCISION OF POSTERIOR NECK ABSCESS (N/A)  Patient Location: PACU  Anesthesia Type:General  Level of Consciousness: awake, alert , oriented and patient cooperative  Airway & Oxygen Therapy: Patient Spontanous Breathing and Patient connected to face mask oxygen  Post-op Assessment: Report given to PACU RN and Post -op Vital signs reviewed and stable  Post vital signs: Reviewed and stable  Complications: No apparent anesthesia complications

## 2013-06-29 NOTE — Interval H&P Note (Signed)
History and Physical Interval Note:  06/29/2013 11:43 AM  [name]  has presented today for surgery, with the diagnosis of recurrent posterior neck abscess  The various methods of treatment have been discussed with the patient and family. After consideration of risks, benefits and other options for treatment, the patient has consented to  Procedure(s):  WIDE LOCAL EXCISION OF POSTERIOR NECK ABSCESS (N/A) as a surgical intervention .  The patient's history has been reviewed, patient examined, no change in status, stable for surgery.  I have reviewed the patient's chart and labs.  Questions were answered to the patient's satisfaction.     Marigene Ehlers., Jed Limerick

## 2013-06-29 NOTE — Telephone Encounter (Signed)
Pt is being released from the hospital today. She said that the hospitals doctor's are given her a prescription for percocet and that she had to tell Dr. Pollie Meyer this information. JW

## 2013-06-30 DIAGNOSIS — G8929 Other chronic pain: Secondary | ICD-10-CM | POA: Insufficient documentation

## 2013-06-30 NOTE — Assessment & Plan Note (Addendum)
Patient signed a Controlled Substances Contract today. This will be scanned into the chart. Reviewed with patient our policy on refills, one pharmacy/one narcotic provider, and urine drug screening. Patient was reasonable in our discussion. I did explain to her that this is a lot of medicine to be on, and that eventually our goal will be to get off of these medications. I anticipate that at the next visit we will work on coming off of tramadol or flexeril. Refilled hydrocodone-acetaminophen, flexeril, and tramadol for 2 months worth. Paper prescriptions handed to patient. She is to follow up with me in September for management of her chronic pain and other chronic medical problems.  Precepted this encounter with Dr. Mauricio Po.

## 2013-07-02 ENCOUNTER — Other Ambulatory Visit (INDEPENDENT_AMBULATORY_CARE_PROVIDER_SITE_OTHER): Payer: Self-pay | Admitting: General Surgery

## 2013-07-02 ENCOUNTER — Telehealth (INDEPENDENT_AMBULATORY_CARE_PROVIDER_SITE_OTHER): Payer: Self-pay | Admitting: General Surgery

## 2013-07-02 DIAGNOSIS — L0291 Cutaneous abscess, unspecified: Secondary | ICD-10-CM

## 2013-07-02 LAB — CULTURE, ROUTINE-ABSCESS

## 2013-07-02 MED ORDER — AMOXICILLIN-POT CLAVULANATE 875-125 MG PO TABS: 1.0000 | ORAL_TABLET | Freq: Two times a day (BID) | ORAL | Status: AC

## 2013-07-02 NOTE — Addendum Note (Signed)
Addendum created 07/02/13 1010 by Lovie Chol, CRNA   Modules edited: Anesthesia Medication Administration

## 2013-07-02 NOTE — Telephone Encounter (Signed)
Called to tell pt per verbal orders by AR to D/C bactrim and go and pick up the Augmentin and start taking that asap.. that he had sent RX to the pharmacy...patient verbalized agreement

## 2013-07-03 ENCOUNTER — Encounter (HOSPITAL_COMMUNITY): Payer: Self-pay | Admitting: General Surgery

## 2013-07-03 ENCOUNTER — Telehealth: Payer: Self-pay | Admitting: Family Medicine

## 2013-07-03 NOTE — Telephone Encounter (Signed)
Called patient to let her know that I got in touch with her pharmacy [facility]) regarding her glucose strips, which pt had reported were not covered by Medicaid. Her pharmacy said these were approved by Medicaid and that she has a shipment coming out to her house next week. Let patient know this information. Pt was appreciative.  Levert Feinstein, MD Family Medicine PGY-2

## 2013-07-04 LAB — ANAEROBIC CULTURE

## 2013-07-06 ENCOUNTER — Other Ambulatory Visit: Payer: Self-pay | Admitting: Family Medicine

## 2013-07-10 IMAGING — CR DG CHEST 2V
2 series · 2 of 2 positions shown · non-contrast
Comparison: 03/17/2012

CLINICAL DATA: Chest pain and shortness of breath.

CHEST - 2 VIEW

[w chest pa]
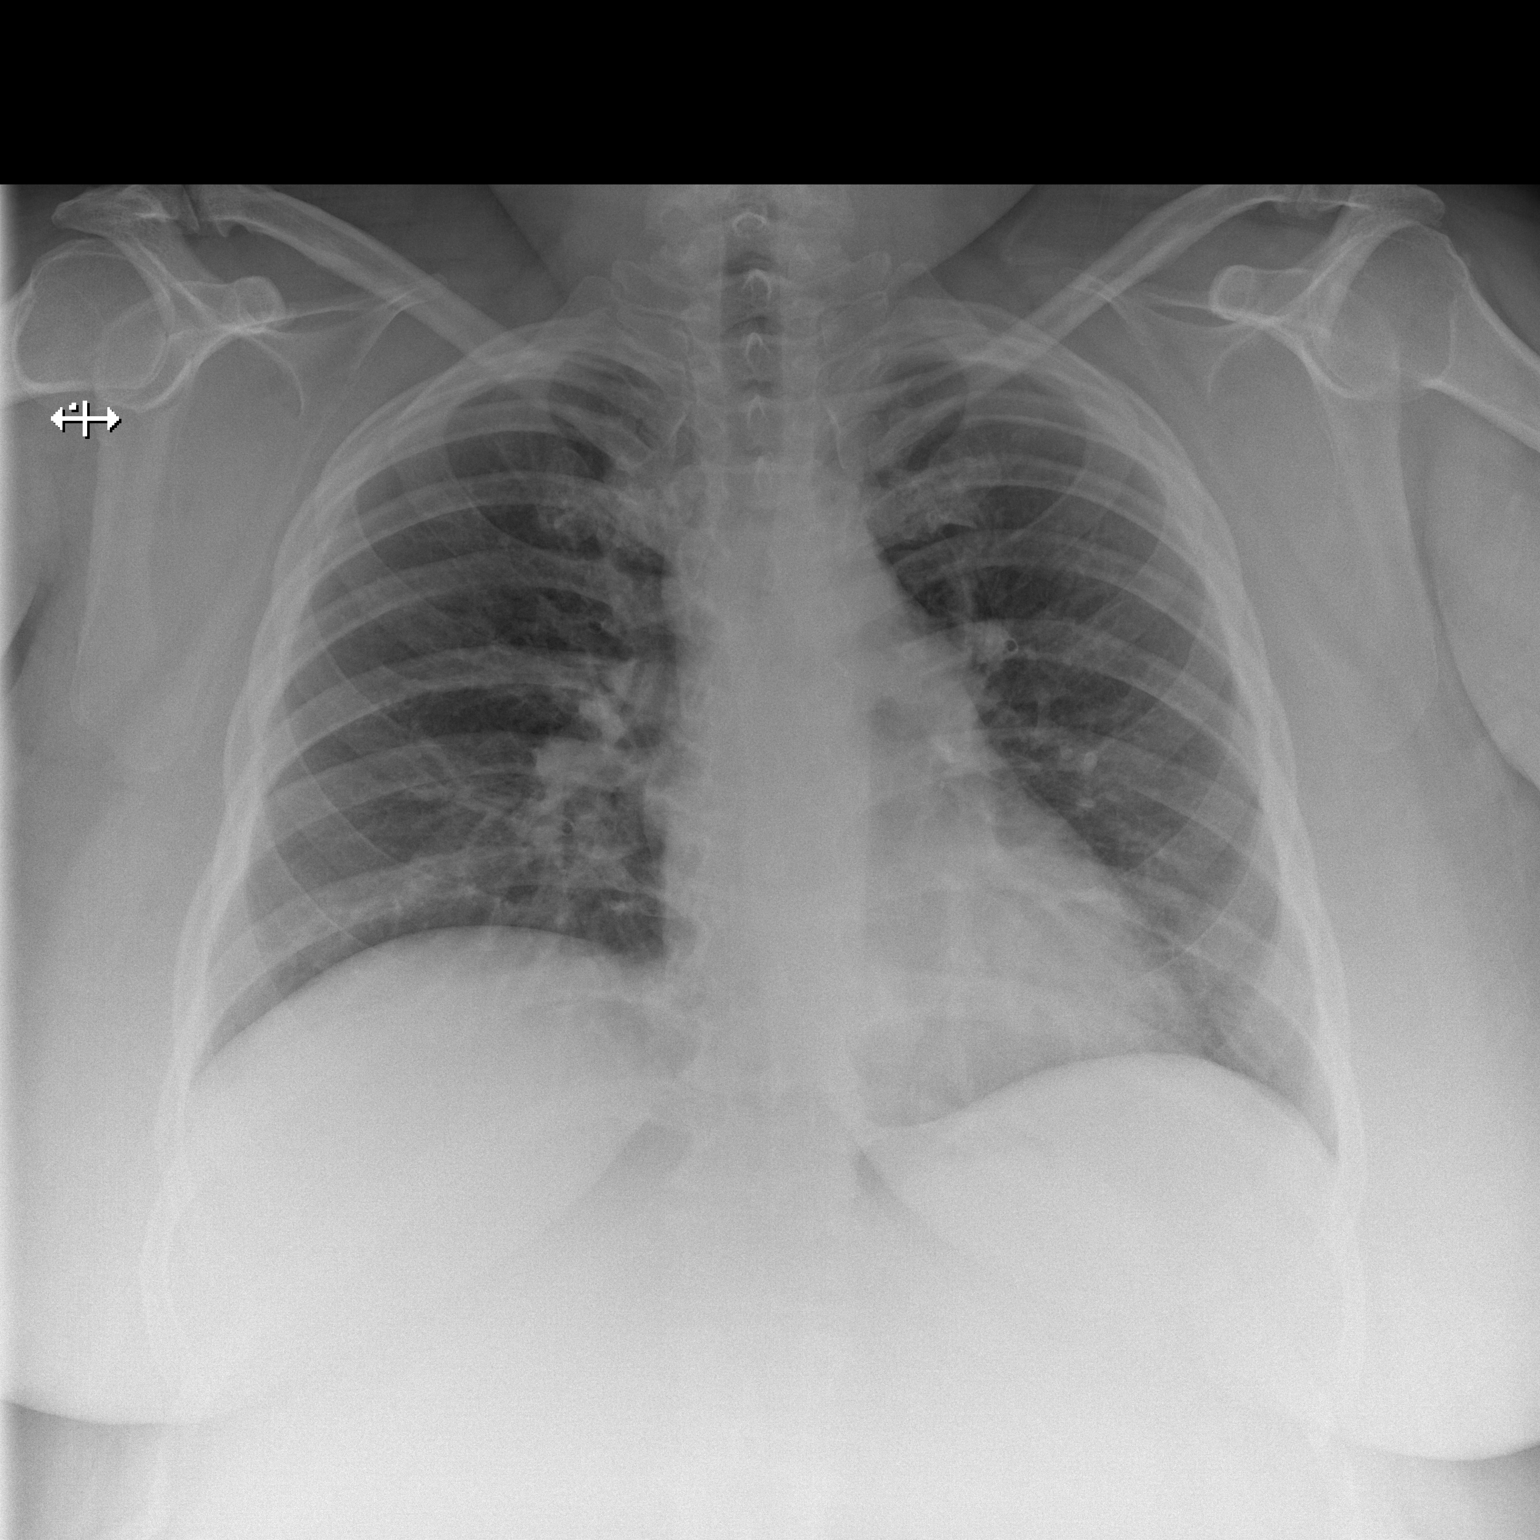

[w chest lat]
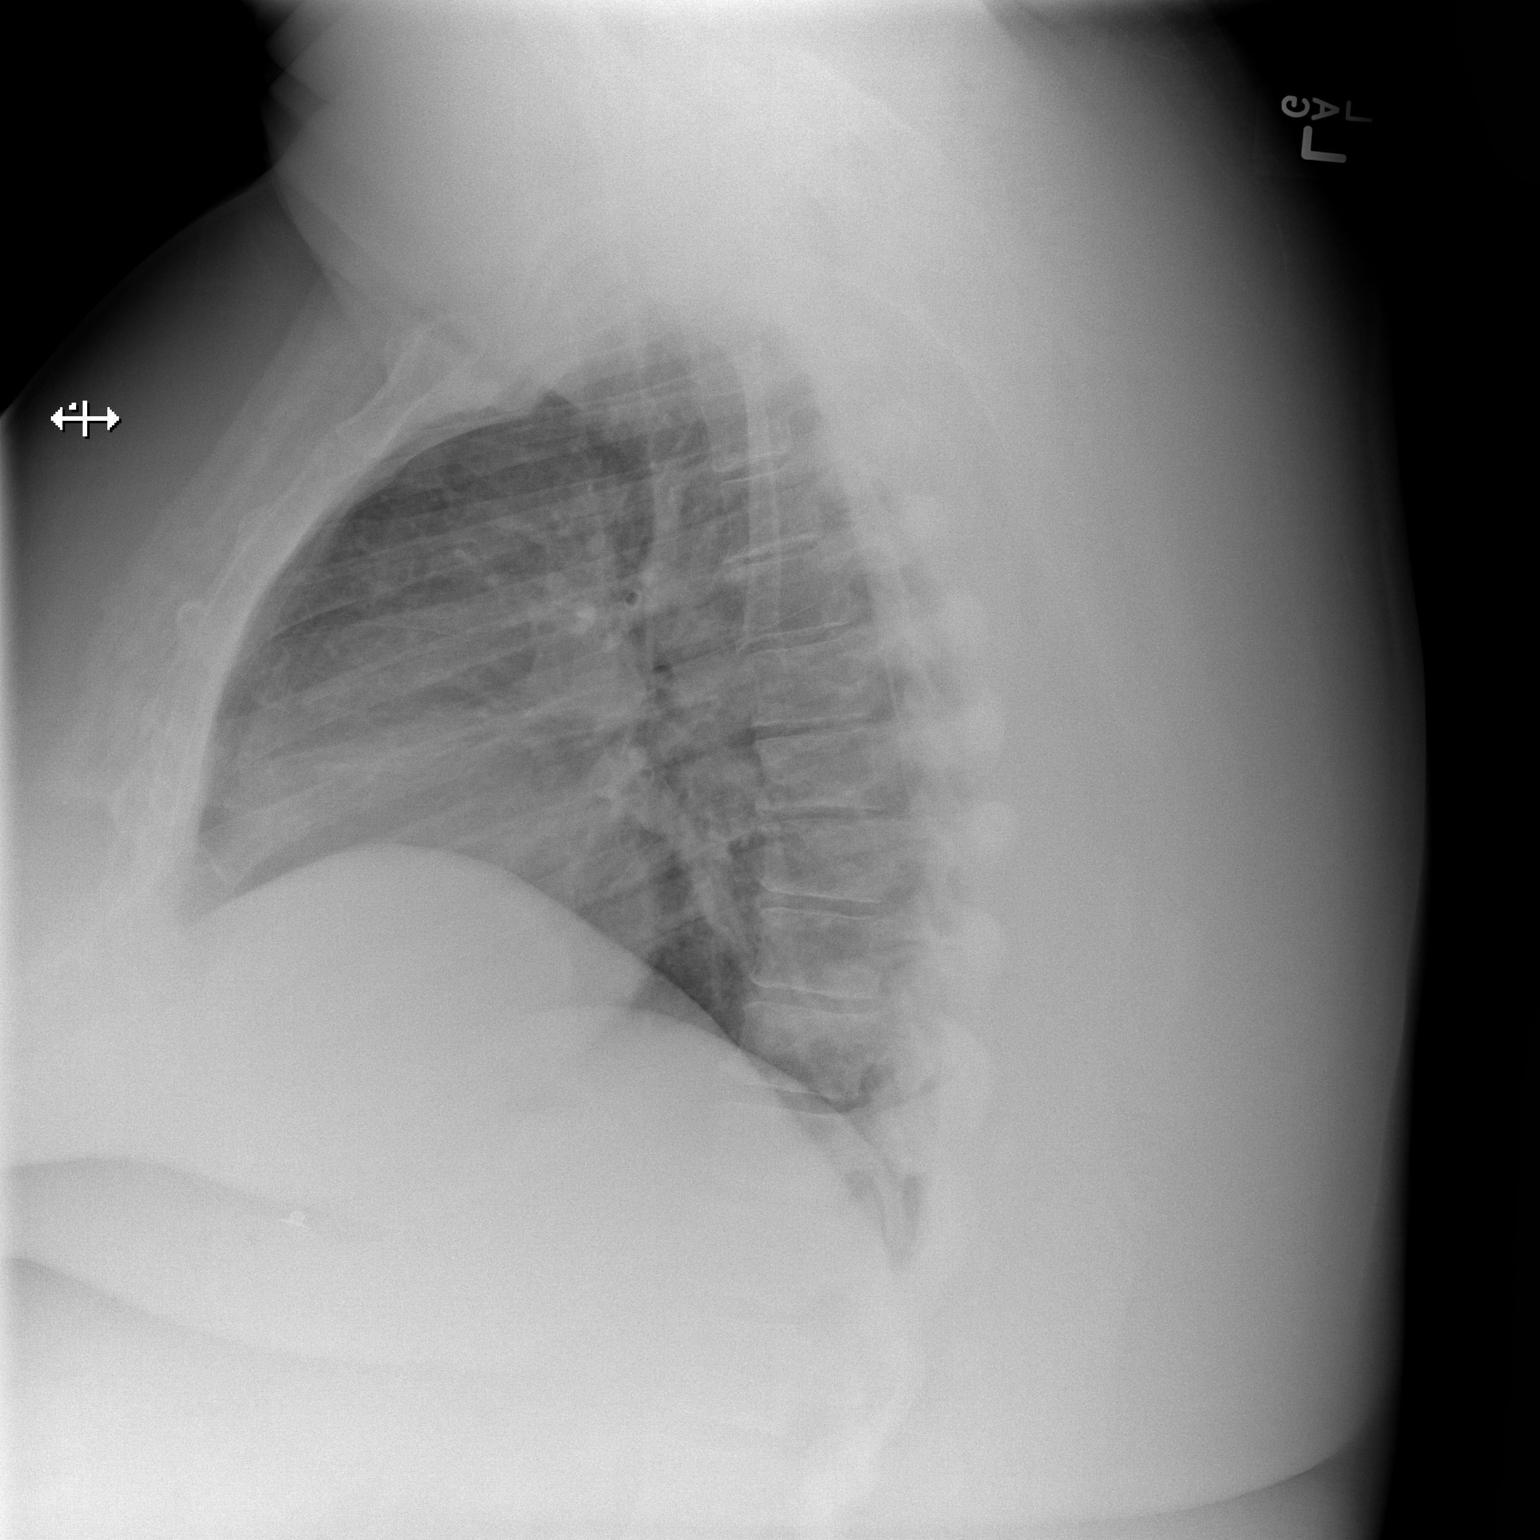

[2 of 2 positions shown; findings below may reference images not displayed]

FINDINGS: Slightly shallow inspiration.  Normal heart size and
pulmonary vascularity.  No focal airspace consolidation in the
lungs.  No blunting of costophrenic angles.  No pneumothorax.
Degenerative changes in the thoracic spine.  Degenerative changes
in the shoulders.
IMPRESSION: No evidence of active pulmonary disease.

## 2013-07-11 ENCOUNTER — Other Ambulatory Visit: Payer: Self-pay | Admitting: Family Medicine

## 2013-07-12 ENCOUNTER — Encounter (INDEPENDENT_AMBULATORY_CARE_PROVIDER_SITE_OTHER): Payer: Self-pay | Admitting: General Surgery

## 2013-07-12 ENCOUNTER — Ambulatory Visit (INDEPENDENT_AMBULATORY_CARE_PROVIDER_SITE_OTHER): Payer: Medicaid Other | Admitting: General Surgery

## 2013-07-12 VITALS — BP 124/80 | HR 84 | Temp 98.0°F | Resp 18 | Ht 62.0 in | Wt 332.0 lb

## 2013-07-12 DIAGNOSIS — L0211 Cutaneous abscess of neck: Secondary | ICD-10-CM

## 2013-07-12 NOTE — Progress Notes (Signed)
Patient ID: [name], female   DOB: 21-Oct-1976, 37 y.o.   MRN: [id number] The patient is a 37 year old female who initially underwent incision and drainage of posterior neck abscess. The patient has been doing well postoperatively he continues with dressing changes and showers. The patient sugars have been in the low 100s while awake.  She has finished her course of antibiotics.  On exam: She has a wound about the size of a quarter, with a clean base, beefy-red, no erythema or drainage  Assessment and plan: 37 year old female status post I&D of a posterior neck abscess 1. The patient to continue dressing changes twice a day and showering. 2. Patient followup in 2 weeks

## 2013-07-17 ENCOUNTER — Ambulatory Visit: Payer: Medicaid Other | Admitting: Pharmacist

## 2013-07-25 ENCOUNTER — Encounter (INDEPENDENT_AMBULATORY_CARE_PROVIDER_SITE_OTHER): Payer: Medicaid Other | Admitting: General Surgery

## 2013-07-30 ENCOUNTER — Other Ambulatory Visit: Payer: Self-pay | Admitting: Family Medicine

## 2013-08-03 ENCOUNTER — Other Ambulatory Visit: Payer: Self-pay | Admitting: Family Medicine

## 2013-08-06 IMAGING — CT CT ANGIO CHEST
2 of 6 series · 19 of 46 positions shown · IV contrast (APPLIED)
Comparison: 10/17/2010.

CLINICAL DATA: Chest pain and elevated D-dimer.

CT ANGIOGRAPHY CHEST
TECHNIQUE: Multidetector CT imaging of the chest using the
standard protocol during bolus administration of intravenous
contrast. Multiplanar reconstructed images including MIPs were
obtained and reviewed to evaluate the vascular anatomy.
Contrast: 100mL OMNIPAQUE IOHEXOL 350 MG/ML SOLN

[Series 6: pulm embolism 1.0 b25f thi · axial · 0.64mm/px · z∈[-222,+33]mm · 16 of 281 slices shown]
[im 13/281  lung]
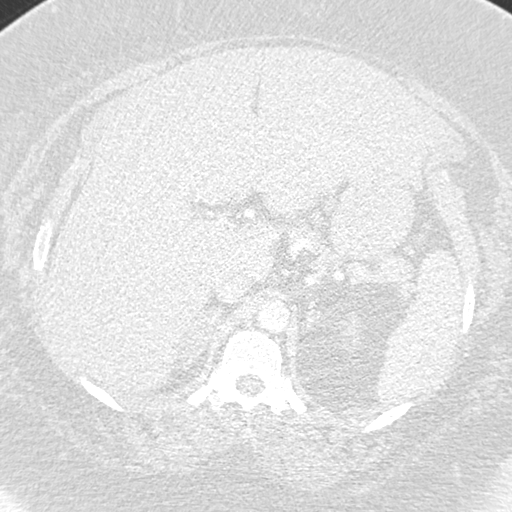
[im 37/281  soft-tissue]
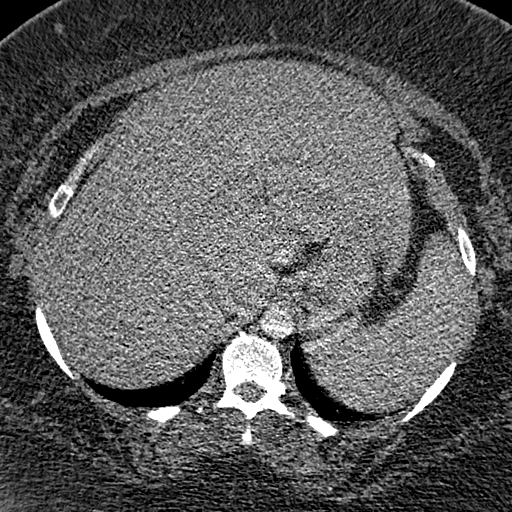
[im 49/281  lung]
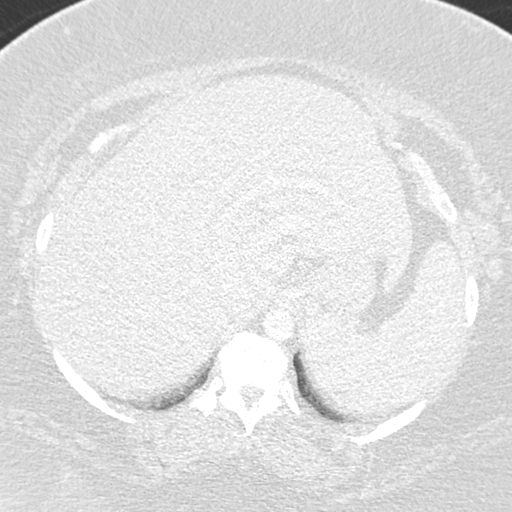
[im 61/281  soft-tissue]
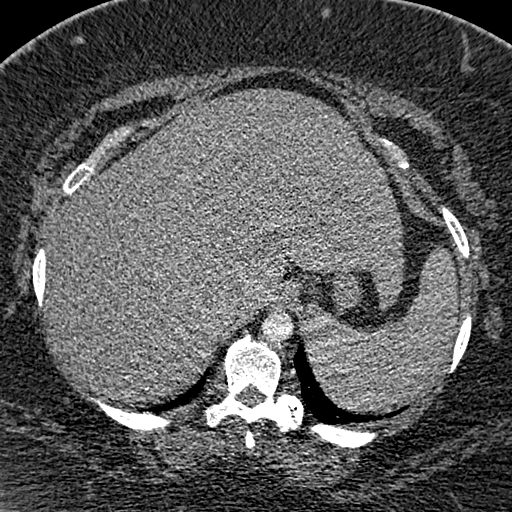
[im 86/281  lung]
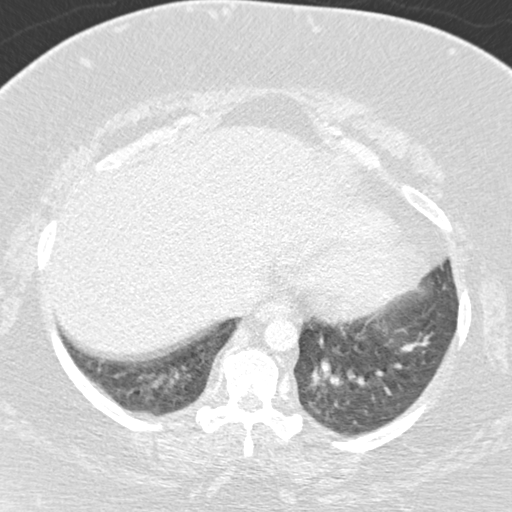
[im 98/281  soft-tissue]
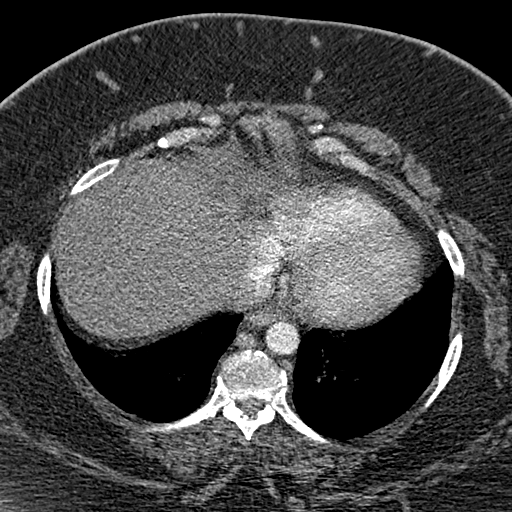
[im 110/281  lung]
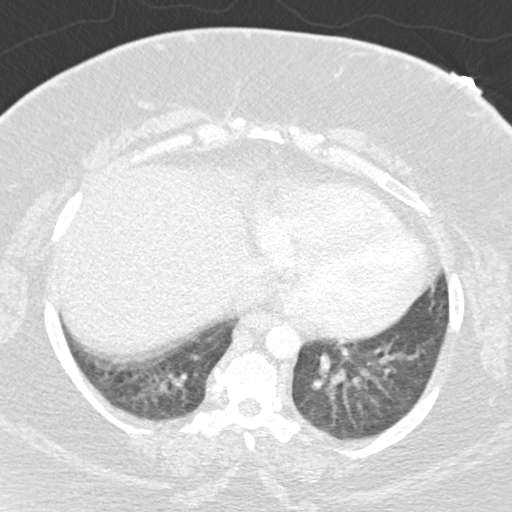
[im 134/281  soft-tissue]
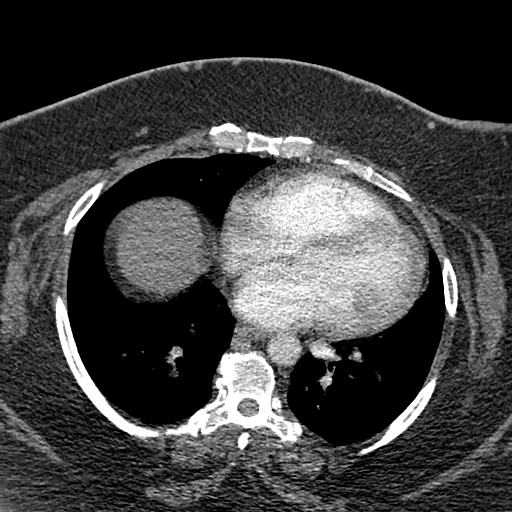
[im 147/281  lung]
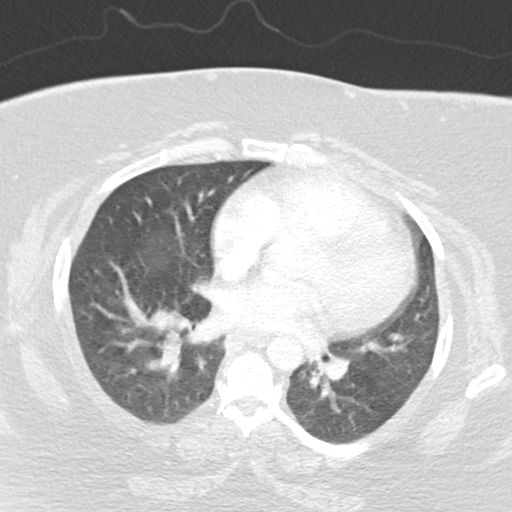
[im 171/281  soft-tissue]
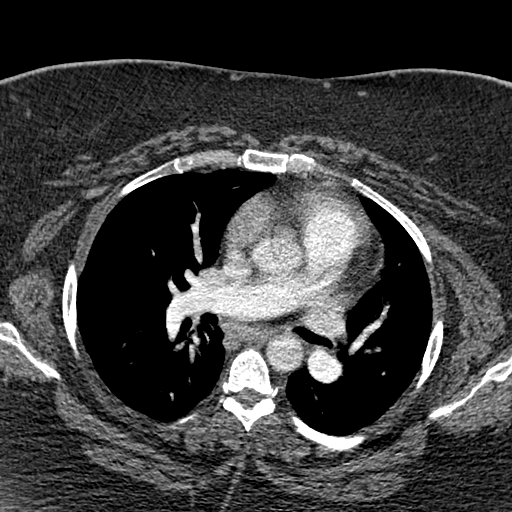
[im 183/281  lung]
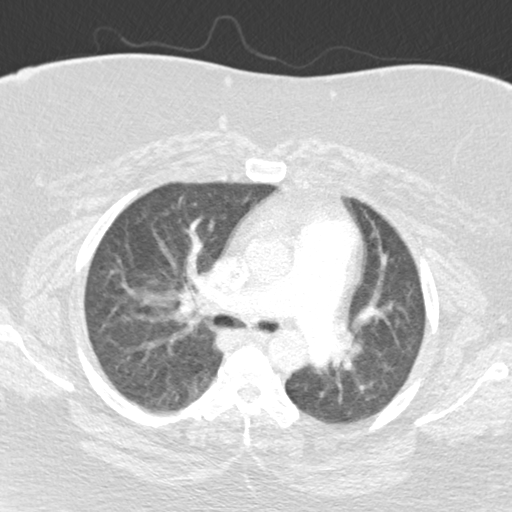
[im 195/281  soft-tissue]
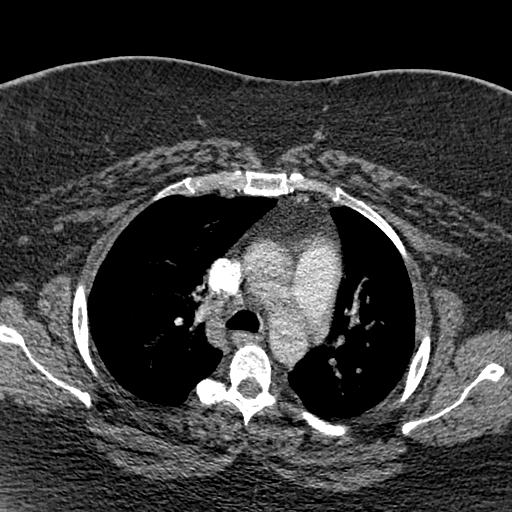
[im 220/281  lung]
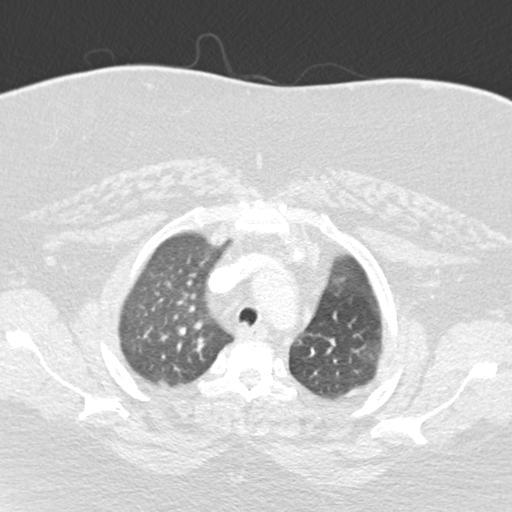
[im 232/281  soft-tissue]
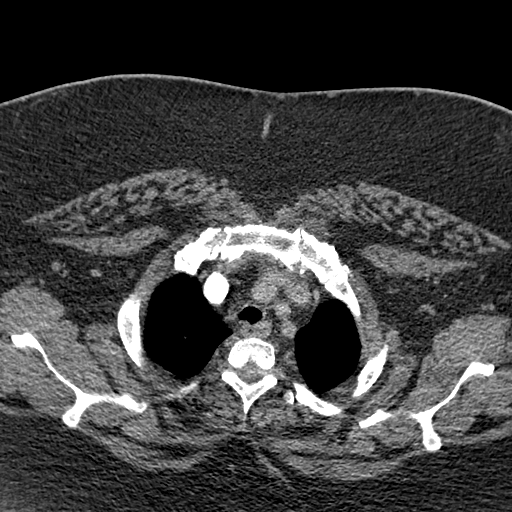
[im 244/281  lung]
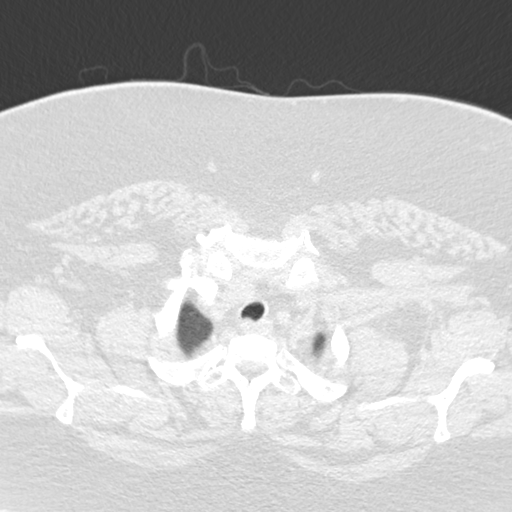
[im 268/281  soft-tissue]
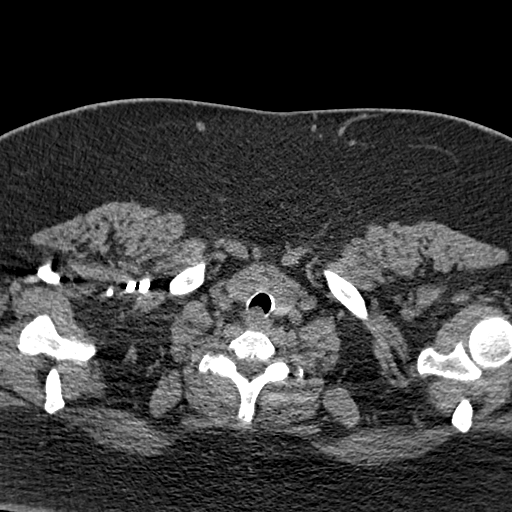

[Series 602: cor · coronal · 0.64mm/px · 3 of 118 slices shown]
[im 30/118  soft-tissue]
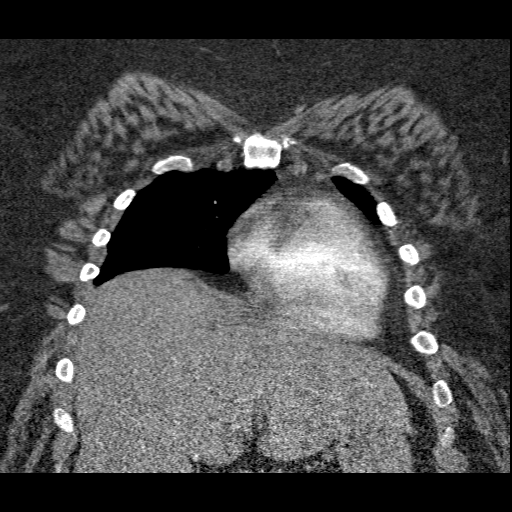
[im 59/118  soft-tissue]
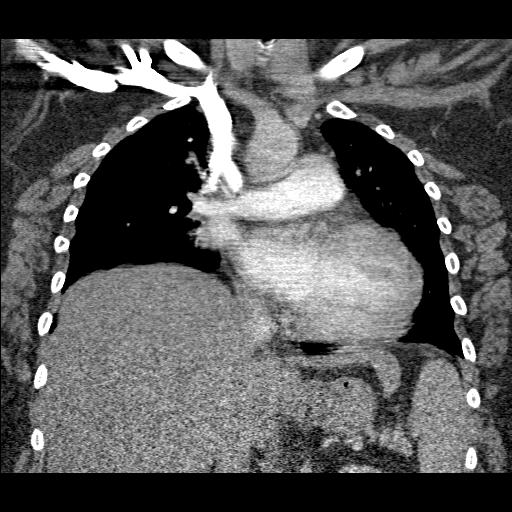
[im 88/118  soft-tissue]
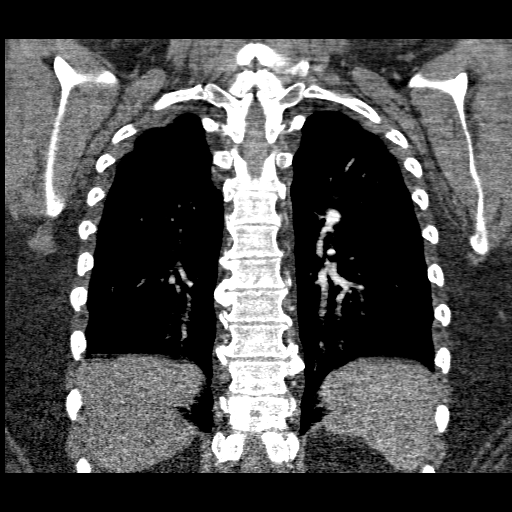

[19 of 46 positions shown; findings below may reference images not displayed]

FINDINGS: Examination is quite limited due to morbid obesity,
breathing motion artifact and suboptimal opacification of the
pulmonary arteries.

The pulmonary arterial tree demonstrates no obvious large central
filling defects to suggest pulmonary emboli.  Evaluation of the
more distal branches is very limited.  The aorta is normal in
caliber.  No dissection. Although the examination is limited I
suspect there may be a pulmonary arteriovenous malformation in the
superior segment of the right lower lobe.

The esophagus is grossly normal.  The heart is normal in size.  No
pericardial effusion.  No mediastinal or hilar adenopathy.

Examination of the lung parenchyma is limited by breathing motion
artifact.  No obvious acute pulmonary process.  Some debris is
noted in the trachea.

The upper abdomen demonstrates a right adrenal gland lesion which
is likely a benign adenoma and is unchanged since a prior abdominal
CT scan from 2362.
IMPRESSION: 1.  Limited examination as discussed above.  No obvious large
central pulmonary emboli.
2.  Possible pulmonary AVM or venous varix in the superior segment
of the right lower lobe.
3.  No definite acute pulmonary findings.

## 2013-08-06 IMAGING — CR DG CHEST 1V PORT
1 series · 1 of 1 positions shown · non-contrast
Comparison: 05/31/2012

CLINICAL DATA: Left-sided chest pain.

PORTABLE CHEST - 1 VIEW

[AP]
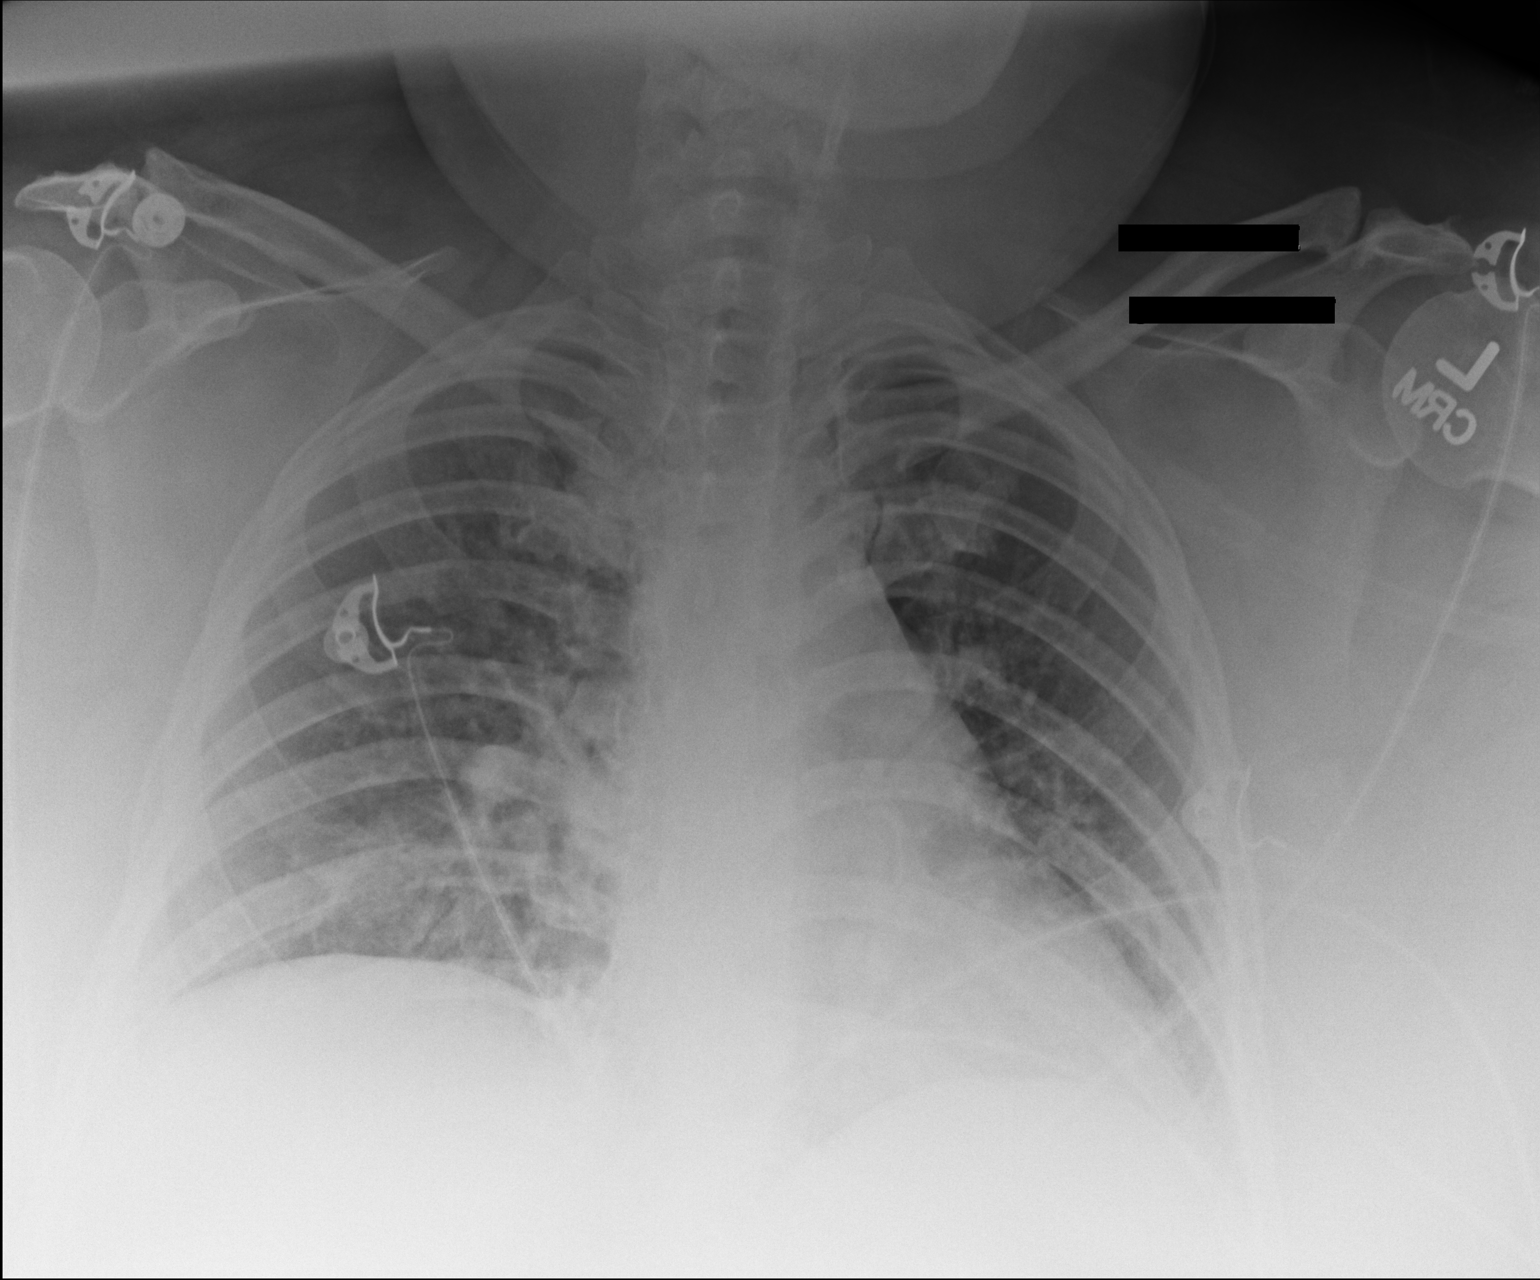

[1 of 1 positions shown; findings below may reference images not displayed]

FINDINGS: Bibasilar atelectasis present with low lung volumes.  No
overt edema or pulmonary consolidation.  Heart size is normal.  No
pleural fluid visualized.
IMPRESSION: Bibasilar atelectasis with low lung volumes.

## 2013-08-08 NOTE — Telephone Encounter (Signed)
Red team, can you please call in #30 of ambien 5mg  one tab PO QHS prn sleep? Thanks, [place]

## 2013-08-08 NOTE — Telephone Encounter (Signed)
Rx called to pharmacy

## 2013-08-08 NOTE — Telephone Encounter (Signed)
Forgot to add, No refills

## 2013-08-09 ENCOUNTER — Encounter (INDEPENDENT_AMBULATORY_CARE_PROVIDER_SITE_OTHER): Payer: Medicaid Other | Admitting: General Surgery

## 2013-08-13 ENCOUNTER — Encounter: Payer: Self-pay | Admitting: Family Medicine

## 2013-08-13 ENCOUNTER — Ambulatory Visit (INDEPENDENT_AMBULATORY_CARE_PROVIDER_SITE_OTHER): Payer: Medicaid Other | Admitting: Family Medicine

## 2013-08-13 ENCOUNTER — Telehealth (INDEPENDENT_AMBULATORY_CARE_PROVIDER_SITE_OTHER): Payer: Self-pay | Admitting: General Surgery

## 2013-08-13 VITALS — BP 125/87 | HR 99 | Ht 62.0 in | Wt 329.7 lb

## 2013-08-13 DIAGNOSIS — Z7189 Other specified counseling: Secondary | ICD-10-CM

## 2013-08-13 DIAGNOSIS — G8929 Other chronic pain: Secondary | ICD-10-CM

## 2013-08-13 DIAGNOSIS — G4700 Insomnia, unspecified: Secondary | ICD-10-CM

## 2013-08-13 MED ORDER — HYDROCODONE-ACETAMINOPHEN 10-325 MG PO TABS: 1.0000 | ORAL_TABLET | Freq: Three times a day (TID) | ORAL | Status: DC | PRN

## 2013-08-13 MED ORDER — TRAMADOL HCL 50 MG PO TABS: 50.0000 mg | ORAL_TABLET | Freq: Every day | ORAL | Status: DC | PRN

## 2013-08-13 MED ORDER — CYCLOBENZAPRINE HCL 10 MG PO TABS: 10.0000 mg | ORAL_TABLET | Freq: Every day | ORAL | Status: DC | PRN

## 2013-08-13 NOTE — Progress Notes (Signed)
Patient ID: [name], female   DOB: Jul 21, 1976, 37 y.o.   MRN: [id number]  HPI:  Pain management: Here for f/u of chronic knee pain. States pain is overall well controlled. Currently taking norco 10-325mg  1 tab TID, flexeril 10mg  1 tab BID, and tramadol 50mg  1-2 times per day (mostly at night). She is able to function with this pain regimen.  Weight loss: has cut out fried food altogether, working on eating more vegetables. Also working on cutting back on regular sodas. Currently drinks 1 regular soda per day. She has begun walking her kids to school every day, about 4 blocks.  Sleep problems: currently uses Ambien about 4-5 days out of the week to help her sleep. She will try to go to bed and then frequently has to get up to take Ambien. Has not ever been taught about sleep hygiene before. Has OSA and uses CPAP every night.   ROS: See HPI  PMFSH: hx poorly controlled diabetes, due for f/u with Dr. Raymondo Band  PHYSICAL EXAM: BP 125/87  Pulse 99  Ht 5\' 2"  (1.575 m)  Wt 329 lb 11.2 oz (149.551 kg)  BMI 60.29 kg/m2  LMP 07/18/2013 Gen: NAD Heart: RRR Lungs: CTAB Neuro: grossly nonfocal, speech intact Ext: L knee slightly tender to palpation. No warmth over either knees. Full strength in knee flexion & extension bilaterally.  ASSESSMENT/PLAN:  # see problem based charting - f/u with Dr. Raymondo Band as soon as can schedule for f/u of diabetes - return to see me in 2 months to f/u on chronic pain

## 2013-08-13 NOTE — Telephone Encounter (Signed)
Called to ask patient if she could be here at 3:30 for her appt and informed her that she would need her $3 copay-patient stated she would try and get the money

## 2013-08-13 NOTE — Patient Instructions (Addendum)
It was great to see you again today!  I refilled your pain medicines for 2 months. Today we are cutting back on the flexeril to once a day. Schedule an appointment to see Dr. Raymondo Band to follow up on the diabetes control. See the handout below on sleep hygiene. Follow these steps and try to use the ambien as little as possible. Call Dr. Gerilyn Pilgrim at [phone] to make an appointment to discuss nutrition. Come back to see me in 2 months.  Be well, Dr. Pollie Meyer   Insomnia Insomnia is frequent trouble falling and/or staying asleep. Insomnia can be a long term problem or a short term problem. Both are common. Insomnia can be a short term problem when the wakefulness is related to a certain stress or worry. Long term insomnia is often related to ongoing stress during waking hours and/or poor sleeping habits. Overtime, sleep deprivation itself can make the problem worse. Every little thing feels more severe because you are overtired and your ability to cope is decreased. CAUSES   Stress, anxiety, and depression.  Poor sleeping habits.  Distractions such as TV in the bedroom.  Naps close to bedtime.  Engaging in emotionally charged conversations before bed.  Technical reading before sleep.  Alcohol and other sedatives. They may make the problem worse. They can hurt normal sleep patterns and normal dream activity.  Stimulants such as caffeine for several hours prior to bedtime.  Pain syndromes and shortness of breath can cause insomnia.  Exercise late at night.  Changing time zones may cause sleeping problems (jet lag). It is sometimes helpful to have someone observe your sleeping patterns. They should look for periods of not breathing during the night (sleep apnea). They should also look to see how long those periods last. If you live alone or observers are uncertain, you can also be observed at a sleep clinic where your sleep patterns will be professionally monitored. Sleep apnea requires a  checkup and treatment. Give your caregivers your medical history. Give your caregivers observations your family has made about your sleep.  SYMPTOMS   Not feeling rested in the morning.  Anxiety and restlessness at bedtime.  Difficulty falling and staying asleep. TREATMENT   Your caregiver may prescribe treatment for an underlying medical disorders. Your caregiver can give advice or help if you are using alcohol or other drugs for self-medication. Treatment of underlying problems will usually eliminate insomnia problems.  Medications can be prescribed for short time use. They are generally not recommended for lengthy use.  Over-the-counter sleep medicines are not recommended for lengthy use. They can be habit forming.  You can promote easier sleeping by making lifestyle changes such as:  Using relaxation techniques that help with breathing and reduce muscle tension.  Exercising earlier in the day.  Changing your diet and the time of your last meal. No night time snacks.  Establish a regular time to go to bed.  Counseling can help with stressful problems and worry.  Soothing music and white noise may be helpful if there are background noises you cannot remove.  Stop tedious detailed work at least one hour before bedtime. HOME CARE INSTRUCTIONS   Keep a diary. Inform your caregiver about your progress. This includes any medication side effects. See your caregiver regularly. Take note of:  Times when you are asleep.  Times when you are awake during the night.  The quality of your sleep.  How you feel the next day. This information will help your caregiver care for  you.  Get out of bed if you are still awake after 15 minutes. Read or do some quiet activity. Keep the lights down. Wait until you feel sleepy and go back to bed.  Keep regular sleeping and waking hours. Avoid naps.  Exercise regularly.  Avoid distractions at bedtime. Distractions include watching television  or engaging in any intense or detailed activity like attempting to balance the household checkbook.  Develop a bedtime ritual. Keep a familiar routine of bathing, brushing your teeth, climbing into bed at the same time each night, listening to soothing music. Routines increase the success of falling to sleep faster.  Use relaxation techniques. This can be using breathing and muscle tension release routines. It can also include visualizing peaceful scenes. You can also help control troubling or intruding thoughts by keeping your mind occupied with boring or repetitive thoughts like the old concept of counting sheep. You can make it more creative like imagining planting one beautiful flower after another in your backyard garden.  During your day, work to eliminate stress. When this is not possible use some of the previous suggestions to help reduce the anxiety that accompanies stressful situations. MAKE SURE YOU:   Understand these instructions.  Will watch your condition.  Will get help right away if you are not doing well or get worse. Document Released: 11/19/2000 Document Revised: 02/14/2012 Document Reviewed: 12/20/2007 [facility] Patient Information 2014 [place], [place].

## 2013-08-14 DIAGNOSIS — G4700 Insomnia, unspecified: Secondary | ICD-10-CM | POA: Insufficient documentation

## 2013-08-14 NOTE — Assessment & Plan Note (Signed)
Pt currently requiring Ambien to sleep 4-5 nights out of the week. Discussed with pt the risk of worsening obstruction with sedating nighttime medications in OSA, and that ideally we need to try to come off of the Ambien. Pt understood this concern. I gave her a handout on sleep hygiene, and have instructed her to follow those instructions and avoid using the Ambien as much as possible. Will f/u in 2 months to see how she is doing with cutting back on the Ambien.

## 2013-08-14 NOTE — Assessment & Plan Note (Signed)
Encouraged efforts in weight loss. Pt was given the contact information for dietician Dr. Gerilyn Pilgrim and instructed to call and schedule an appointment to discuss nutrition.

## 2013-08-14 NOTE — Assessment & Plan Note (Signed)
Pain currently well controlled. Reviewed controlled substances database - patient has had appropriate timing of her refills. Discussed again today with patient the importance of eventually tapering off all of these pain medicines. She is now on 4 potentially sedating substances (norco, tramadol, flexeril, and ambien). Patient is amenable to the idea of gradually coming down from these medicines. We established the goal today of cutting back on one medicine at each visit, with the expectation to follow up every 2 months. Today pt elected to decrease flexeril from twice per day to once per day. Will refill two months worth of her medications and plan to again decrease the dose of or eliminate one of her medicines at her next visit.

## 2013-08-15 ENCOUNTER — Encounter (INDEPENDENT_AMBULATORY_CARE_PROVIDER_SITE_OTHER): Payer: Medicaid Other | Admitting: General Surgery

## 2013-08-28 ENCOUNTER — Other Ambulatory Visit: Payer: Self-pay | Admitting: Family Medicine

## 2013-08-28 ENCOUNTER — Encounter (INDEPENDENT_AMBULATORY_CARE_PROVIDER_SITE_OTHER): Payer: Self-pay | Admitting: General Surgery

## 2013-08-31 ENCOUNTER — Ambulatory Visit: Payer: Medicaid Other | Admitting: Pharmacist

## 2013-09-01 ENCOUNTER — Other Ambulatory Visit: Payer: Self-pay | Admitting: Family Medicine

## 2013-09-03 IMAGING — CR DG CHEST 2V
2 series · 2 of 2 positions shown · non-contrast
Comparison: 06/27/2012

CLINICAL DATA: Left chest pain

CHEST - 2 VIEW

[w chest pa]
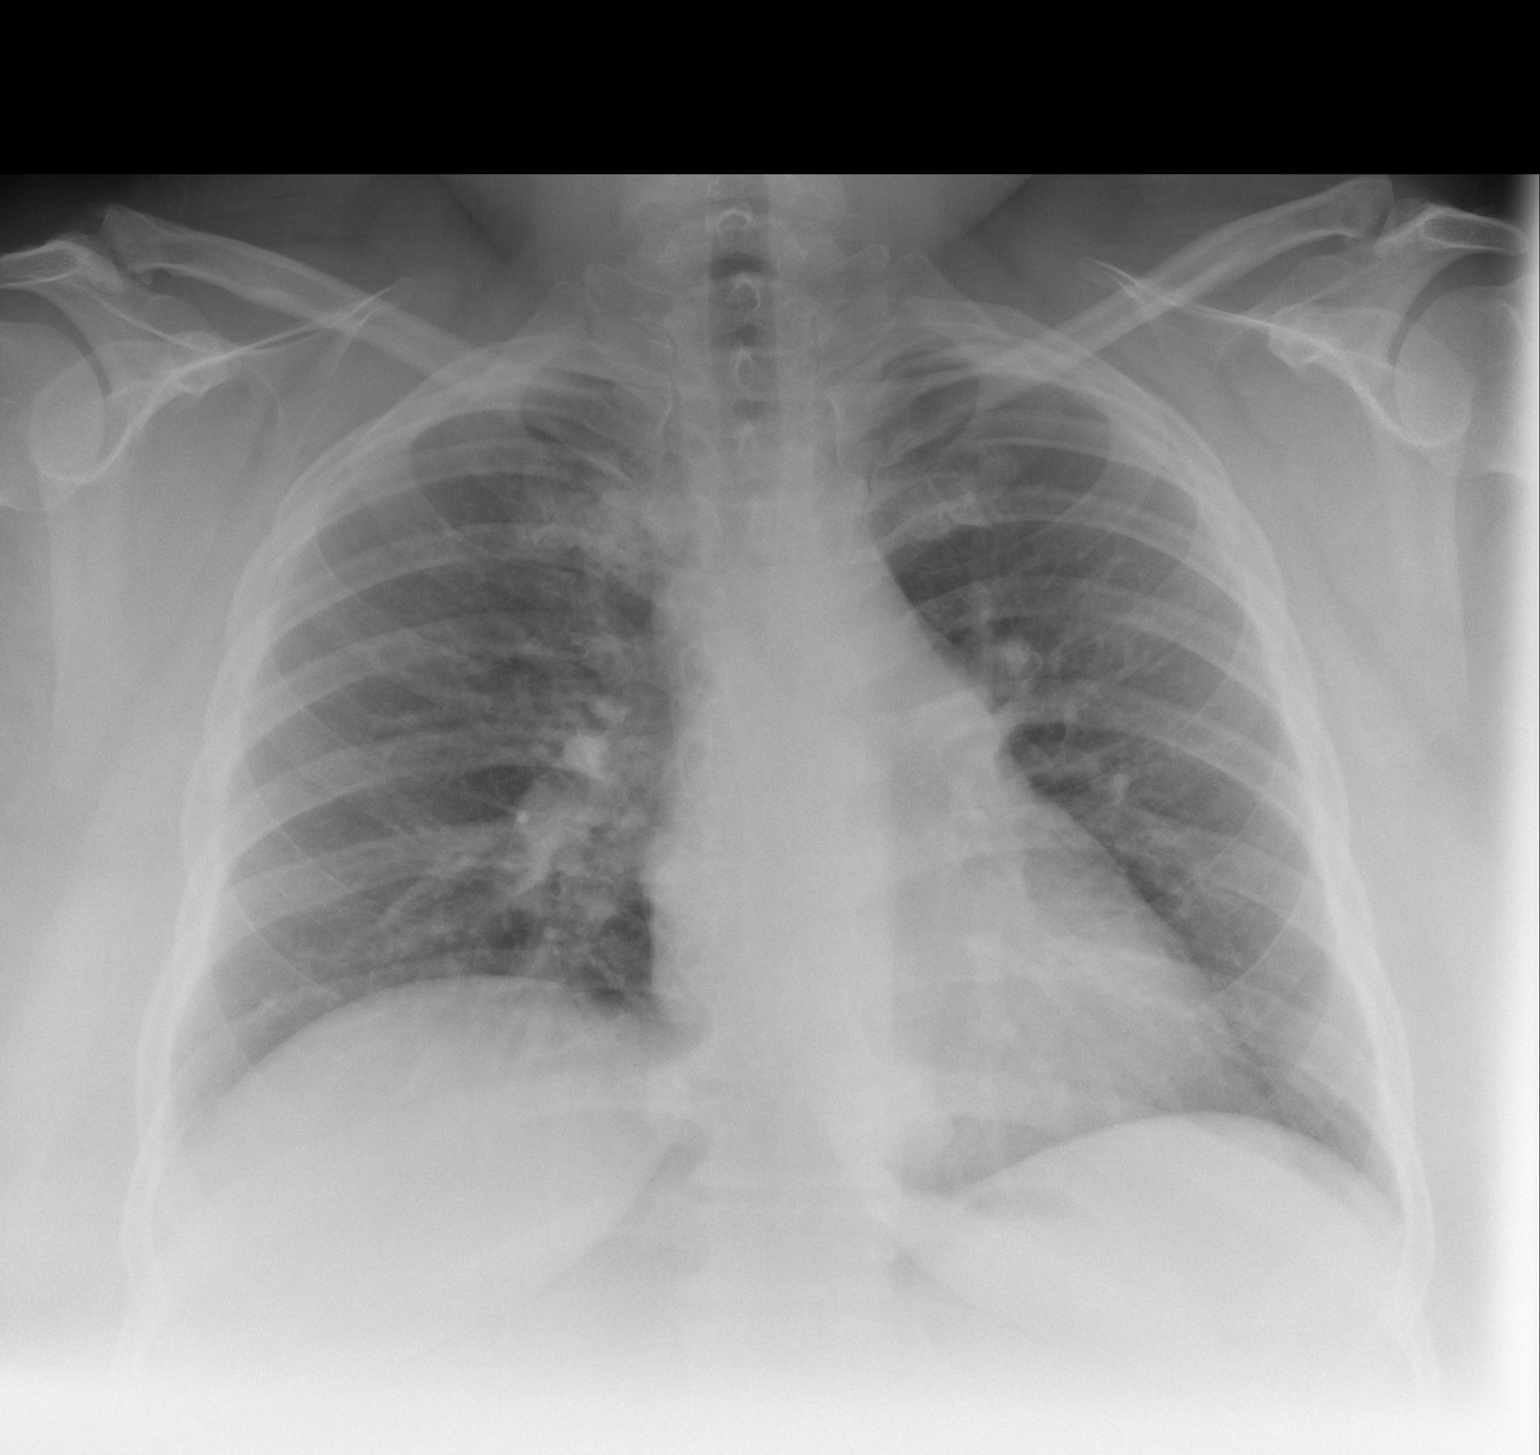

[w chest lat]
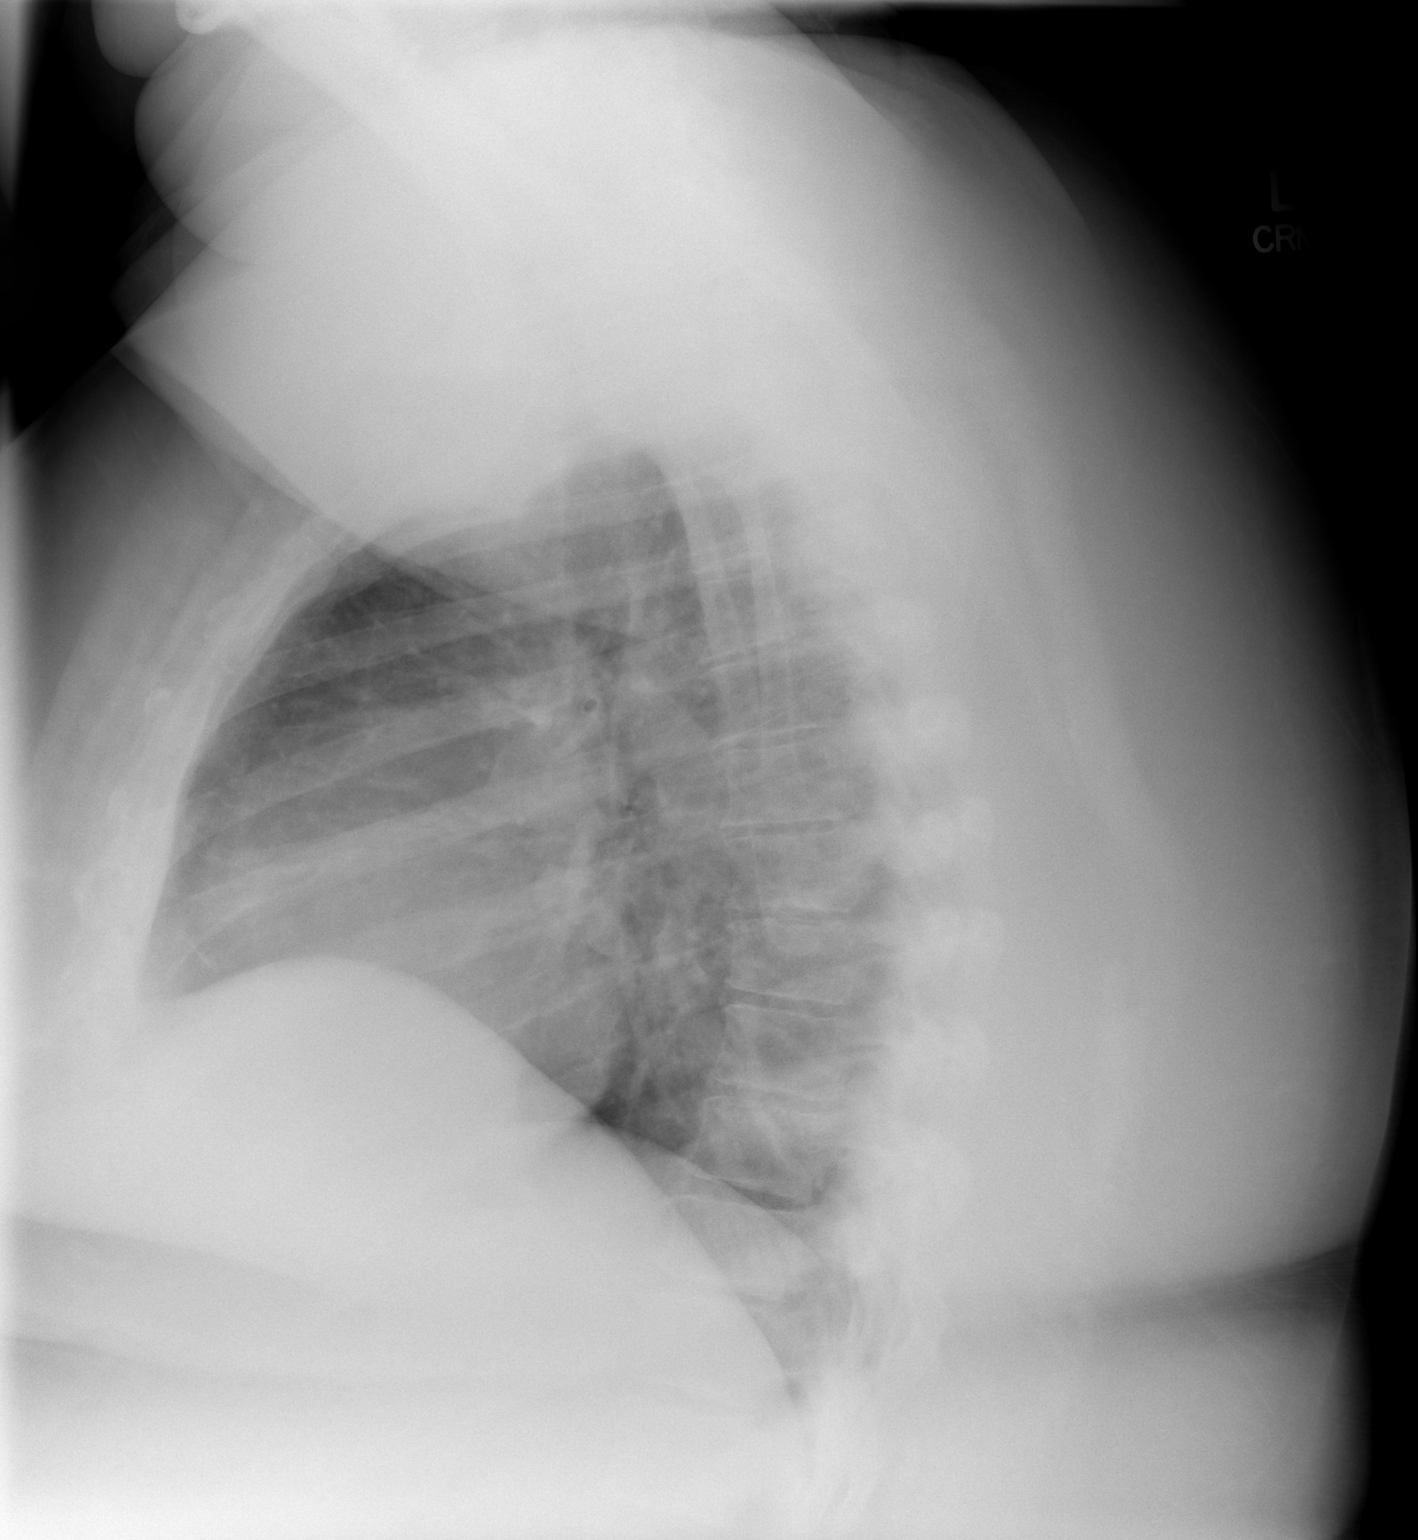

[2 of 2 positions shown; findings below may reference images not displayed]

FINDINGS: Lungs are clear without infiltrate or effusion.  Negative
for heart failure or mass.
IMPRESSION: No active cardiopulmonary disease.

## 2013-09-03 MED ORDER — ZOLPIDEM TARTRATE 5 MG PO TABS: ORAL_TABLET | ORAL | Status: DC

## 2013-09-03 NOTE — Addendum Note (Signed)
Addended by: Latrelle Dodrill on: 09/03/2013 02:53 PM   Modules accepted: Orders

## 2013-09-05 IMAGING — CR DG CHEST 2V
2 series · 2 of 2 positions shown · non-contrast
Comparison: 07/25/2012

CLINICAL DATA: Chest pain radiating down the left arm.  Nausea and
shortness of breath.

CHEST - 2 VIEW

[w chest pa]
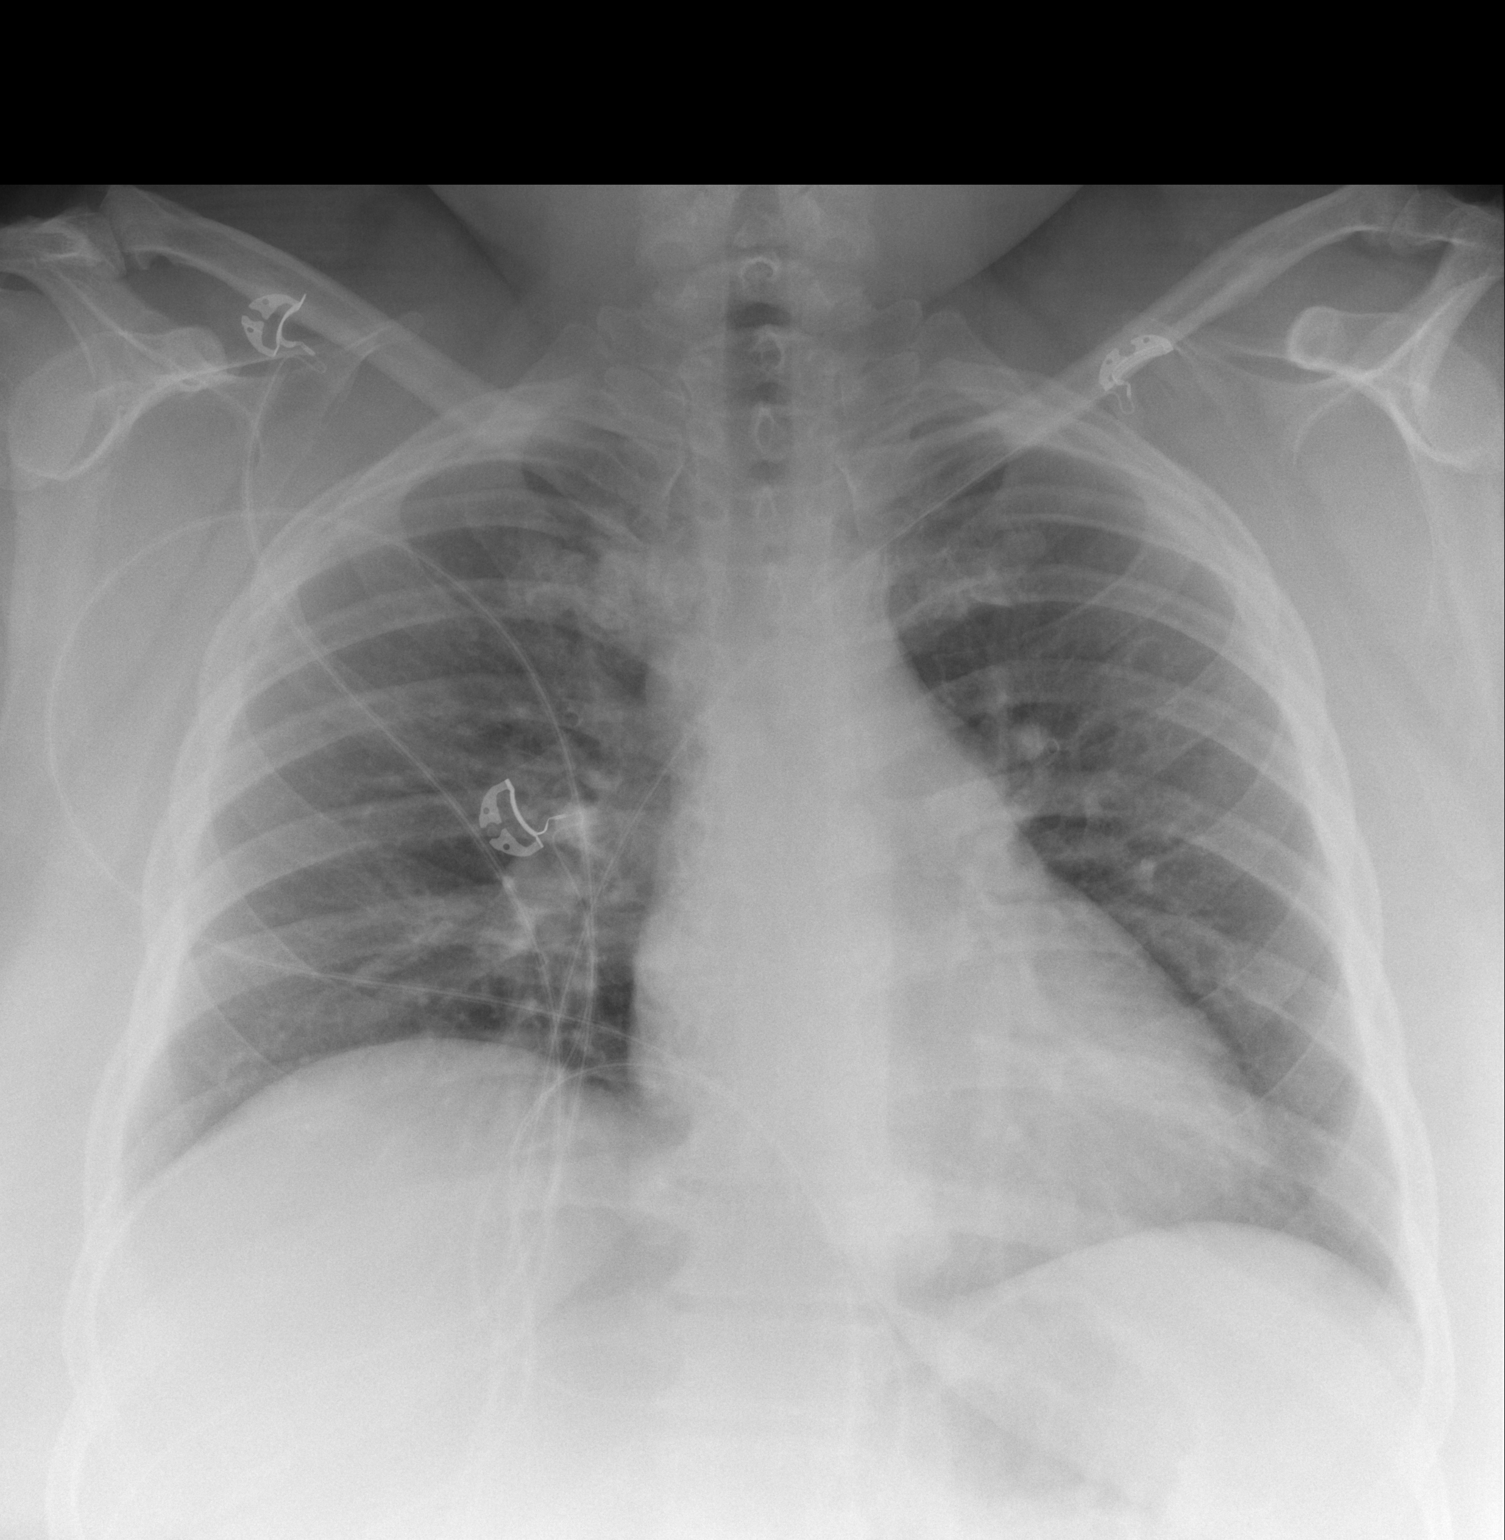

[w chest lat]
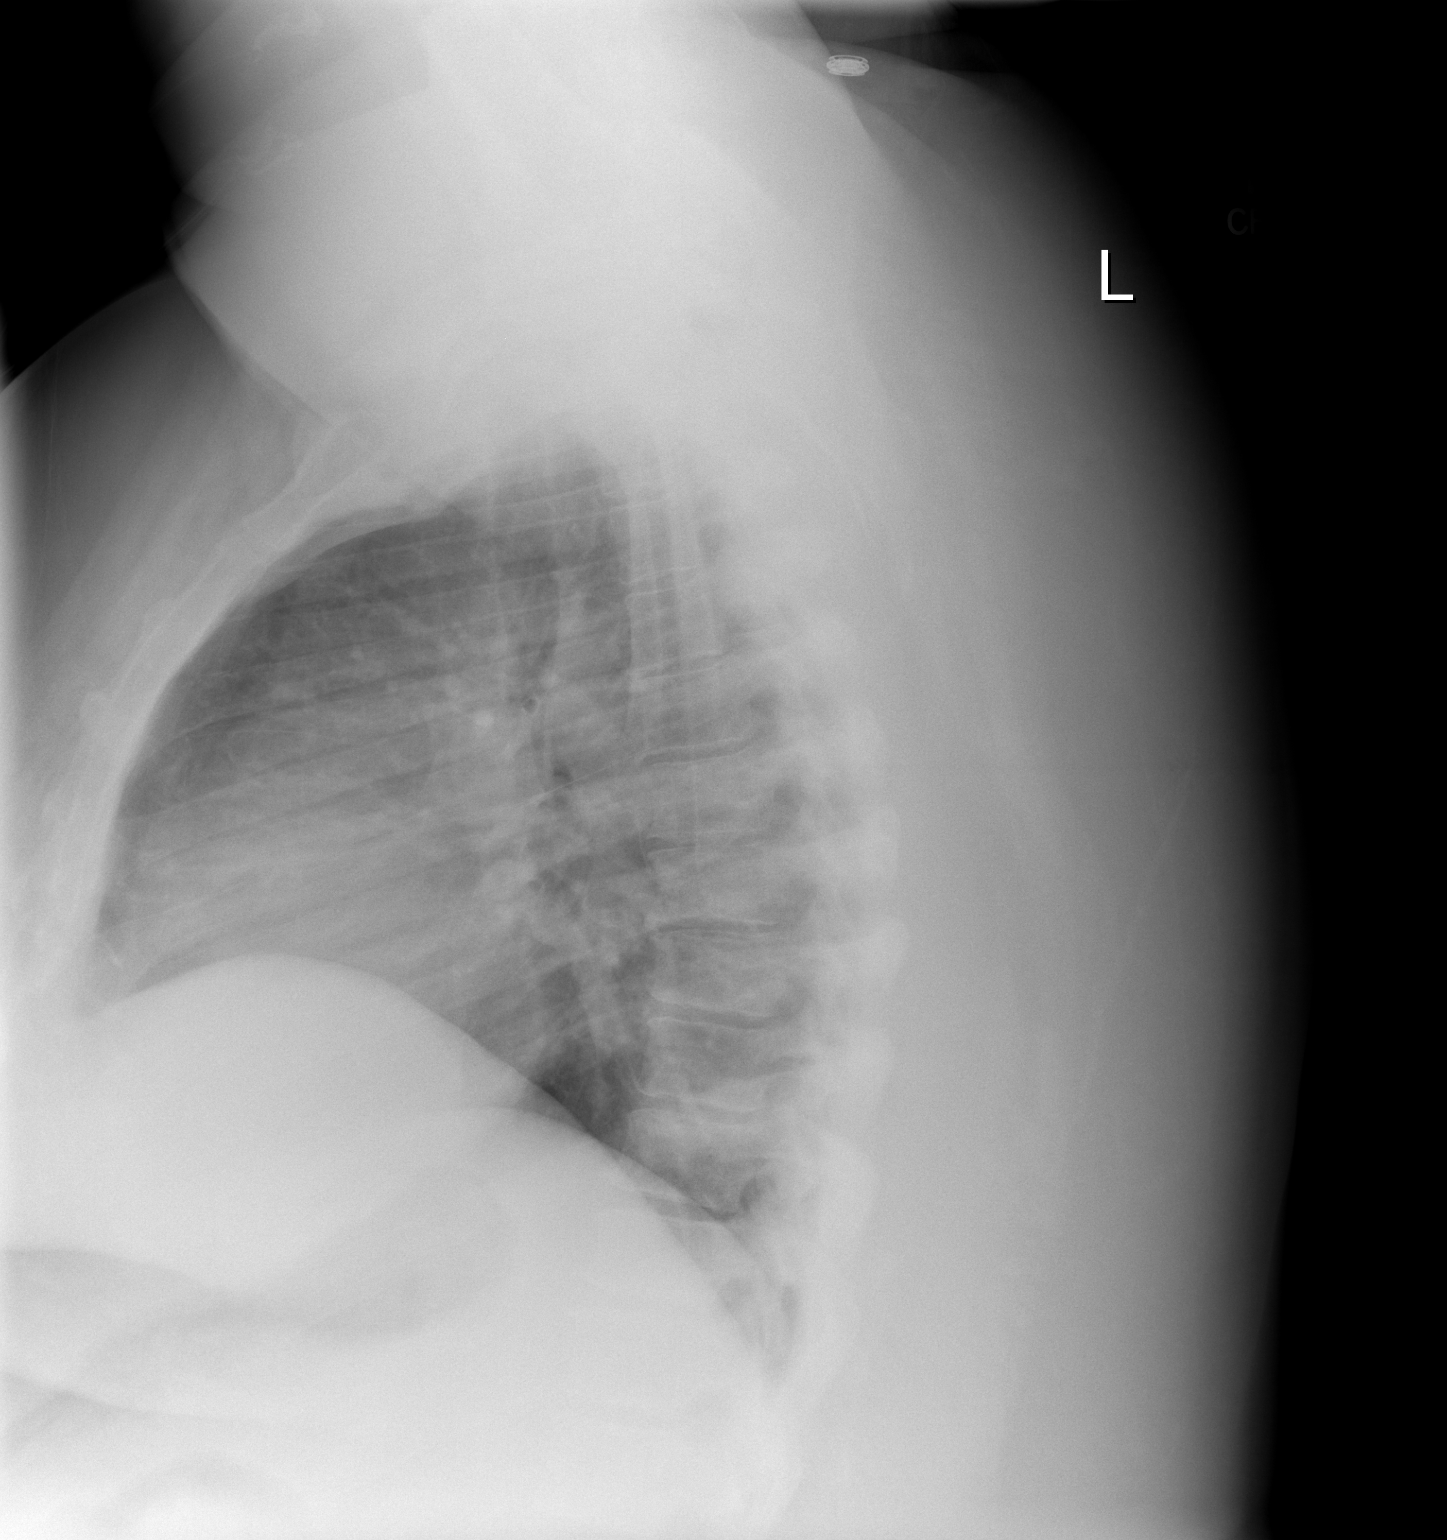

[2 of 2 positions shown; findings below may reference images not displayed]

FINDINGS: The heart size and pulmonary vascularity are normal. The
lungs appear clear and expanded without focal air space disease or
consolidation. No blunting of the costophrenic angles.  No
pneumothorax.  Mediastinal contours appear intact.  Degenerative
changes in the spine and shoulders.  Stable appearance since
previous study.
IMPRESSION: No evidence of active pulmonary disease.

## 2013-09-12 ENCOUNTER — Ambulatory Visit (INDEPENDENT_AMBULATORY_CARE_PROVIDER_SITE_OTHER): Payer: Medicaid Other | Admitting: Family Medicine

## 2013-09-12 VITALS — BP 136/90 | HR 103 | Temp 99.5°F | Ht 62.0 in | Wt 323.9 lb

## 2013-09-12 DIAGNOSIS — L0291 Cutaneous abscess, unspecified: Secondary | ICD-10-CM

## 2013-09-12 MED ORDER — CLINDAMYCIN HCL 300 MG PO CAPS: 300.0000 mg | ORAL_CAPSULE | Freq: Three times a day (TID) | ORAL | Status: DC

## 2013-09-12 NOTE — Patient Instructions (Signed)
It was great to see you again today!  I sent in an antibiotic, clindamycin. I will call you if we need to change the antibiotic based on your wound culture. Gently wash with soap and water each day. Come back for a follow up visit in 1 week.  If it starts looking worse or you feel bad (fever, chills, etc) return sooner.  Be well, Dr. Pollie Meyer

## 2013-09-13 ENCOUNTER — Ambulatory Visit: Payer: Medicaid Other

## 2013-09-13 ENCOUNTER — Telehealth: Payer: Self-pay | Admitting: Family Medicine

## 2013-09-13 NOTE — Telephone Encounter (Signed)
Received fax referral request from [facility] to authorize referral for counseling services. I called pt to clarify what this was for since we have not discussed mental health concerns in any of our previous office visits. She recently started going to this counseling center for therapy. They are working with her on getting into mental health/psychiatry for possible pharmacologic treatment. She is not on any psychiatric medications at this time but does have a history of depression according to her problem list. I will authorize referral and fax the form back.  Latrelle Dodrill, MD

## 2013-09-14 NOTE — Progress Notes (Signed)
Patient ID: [name], female   DOB: 03/10/1976, 37 y.o.   MRN: [id number]  HPI:  Pt presents for a same day appointment to discuss abscess on abdomen. Patient reports that this spot has been present for one week. It is located on her right lower caution of her abdomen, in between the fold of her pannus. Denies any fevers. She is eating and drinking well. The abscess popped spontaneously, and has been draining for multiple days now. The fluid coming from it has been foul-smelling. She has been putting gauze over it, and washing it with soap and water  ROS: See HPI  PMFSH: History of uncontrolled diabetes  PHYSICAL EXAM: BP 136/90  Pulse 103  Temp(Src) 99.5 F (37.5 C) (Oral)  Ht 5\' 2"  (1.575 m)  Wt 323 lb 14.4 oz (146.92 kg)  BMI 59.23 kg/m2  LMP 08/29/2013 Gen: No acute distress, pleasant and cooperative Lungs: Normal respiratory effort Abdomen: There is a approximately 3 cm x 1.5 cm spontaneously opened abscess present on the anterior aspect of her abdomen, in the right lower quadrant. There is mild surrounding erythema. The wound is foul-smelling. There is some necrotic tissue present on the surface of the sore, however otherwise appears well granulated. It does not bluntly probe more deeply than approximately 1 cm. No purulent drainage noted. No fluctuance or excessive induration. Abdomen is morbidly obese. Neuro: grossly nonfocal, speech  PROCEDURE NOTE:  After verbal informed consent was obtained, patient was anesthetized using topical EMLA cream. A small amount of necrotic tissue was sharply dissected from the wound. There was no bleeding. The patient tolerated the procedure well. Procedure was supervised and its entirety by Dr. Armen Pickup.  ASSESSMENT/PLAN:  # Abscess: Has spontaneously opened, necrotic tissue debrided today. Does not appear to be deep. Anticipate this will continue to heal on its own, however due to her uncontrolled diabetes, will treat with oral antibiotics to  aid in wound healing. She does report that she is currently sexually active without any forms of birth control, so will utilize clindamycin as opposed to doxycycline or Bactrim. We did obtain a culture of the wound, which will need to be followed up.  Advised that she keep the area covered with gauze, and followup in one week for recheck of wound.  FOLLOW UP: F/u in one week for wound recheck  [place] J. Pollie Meyer, MD [facility]

## 2013-09-15 LAB — WOUND CULTURE: Gram Stain: NONE SEEN

## 2013-09-18 ENCOUNTER — Ambulatory Visit: Payer: Medicaid Other | Admitting: Family Medicine

## 2013-09-24 ENCOUNTER — Telehealth: Payer: Self-pay | Admitting: Family Medicine

## 2013-09-24 NOTE — Telephone Encounter (Signed)
Calling the emergency line with pain in both legs.  She started having numbness and tingling in her feet 3 weeks ago with shooting pain. Pain is throbbing and coming up her leg. Today, feet have been throbbing. She has had symmetric swelling  Denies any dyspnea. Had some chest discomfort on right side, lasted 20 minutes, 1 hr ago, pain was sharp, non pleuritic.   I recommended that she be seen in same day clinic tomorrow for evaluation of her leg pain. If she continues to have persistent chest pain, she should be seen in the ED overnight.  Patient agreed.   Marena Chancy, PGY-3 Family Medicine Resident

## 2013-09-25 ENCOUNTER — Telehealth: Payer: Self-pay | Admitting: Family Medicine

## 2013-09-25 ENCOUNTER — Other Ambulatory Visit: Payer: Self-pay | Admitting: Family Medicine

## 2013-09-25 DIAGNOSIS — J45909 Unspecified asthma, uncomplicated: Secondary | ICD-10-CM | POA: Insufficient documentation

## 2013-09-25 DIAGNOSIS — F3189 Other bipolar disorder: Secondary | ICD-10-CM | POA: Insufficient documentation

## 2013-09-25 DIAGNOSIS — G4733 Obstructive sleep apnea (adult) (pediatric): Secondary | ICD-10-CM | POA: Insufficient documentation

## 2013-09-25 DIAGNOSIS — Z794 Long term (current) use of insulin: Secondary | ICD-10-CM | POA: Insufficient documentation

## 2013-09-25 DIAGNOSIS — IMO0001 Reserved for inherently not codable concepts without codable children: Secondary | ICD-10-CM | POA: Insufficient documentation

## 2013-09-25 DIAGNOSIS — I10 Essential (primary) hypertension: Secondary | ICD-10-CM | POA: Insufficient documentation

## 2013-09-25 DIAGNOSIS — Z87891 Personal history of nicotine dependence: Secondary | ICD-10-CM | POA: Insufficient documentation

## 2013-09-25 DIAGNOSIS — E119 Type 2 diabetes mellitus without complications: Secondary | ICD-10-CM | POA: Insufficient documentation

## 2013-09-25 DIAGNOSIS — Z79899 Other long term (current) drug therapy: Secondary | ICD-10-CM | POA: Insufficient documentation

## 2013-09-25 DIAGNOSIS — Z8709 Personal history of other diseases of the respiratory system: Secondary | ICD-10-CM | POA: Insufficient documentation

## 2013-09-25 DIAGNOSIS — I8390 Asymptomatic varicose veins of unspecified lower extremity: Secondary | ICD-10-CM | POA: Insufficient documentation

## 2013-09-25 DIAGNOSIS — K219 Gastro-esophageal reflux disease without esophagitis: Secondary | ICD-10-CM | POA: Insufficient documentation

## 2013-09-25 DIAGNOSIS — R252 Cramp and spasm: Secondary | ICD-10-CM | POA: Insufficient documentation

## 2013-09-25 DIAGNOSIS — Z8719 Personal history of other diseases of the digestive system: Secondary | ICD-10-CM | POA: Insufficient documentation

## 2013-09-25 DIAGNOSIS — Z888 Allergy status to other drugs, medicaments and biological substances status: Secondary | ICD-10-CM | POA: Insufficient documentation

## 2013-09-25 NOTE — Telephone Encounter (Signed)
[name] Emergency Line  Patient is a 37 y.o. female with h/o obesity who calls reporting right throbbing foot pain bilaterally, right > left for 3 days.  She reports that over the past 3 days, she has had pain like a spasm in her feet that travels up her anterior leg into her knee and is "really bad." She has had intermittent swelling for three days, right > left, but now there is no swelling. She denies color change or temperature change. She denies dyspnea. She had some chest pain yesterday but has had this before and was worked up in the past by a cardiologist who "ruled out" her heart. She denies leg pain like this before or h/o DVT or PE. She denies fevers, dizziness, or fainting. She denies prolonged immobility, oral/patch/ring contraception, or tobacco use. She is obese.  - I explained to patient the concern for DVT but that her symptoms sounded more likely to be muscle spasm with her MSK history.  - However, I recommended she come into clinic for same day appointment for further evaluation and consideration of LE duplex based on evaluation. - Return precautions and reasons to come to ED reviewed, including asymmetric swelling, shortness of breath, or return of chest pain. - Pt voiced understanding.  Belinda Singleton, MD 09/25/2013 10:24 PM

## 2013-09-26 ENCOUNTER — Emergency Department (HOSPITAL_COMMUNITY)
Admission: EM | Admit: 2013-09-26 | Discharge: 2013-09-26 | Disposition: A | Discharge status: Home / Self Care | Payer: Medicaid Other | Attending: Emergency Medicine | Admitting: Emergency Medicine

## 2013-09-26 ENCOUNTER — Encounter (HOSPITAL_COMMUNITY): Payer: Self-pay | Admitting: Emergency Medicine

## 2013-09-26 DIAGNOSIS — R252 Cramp and spasm: Secondary | ICD-10-CM

## 2013-09-26 DIAGNOSIS — R739 Hyperglycemia, unspecified: Secondary | ICD-10-CM

## 2013-09-26 LAB — POCT I-STAT, CHEM 8
Calcium, Ion: 1.18 mmol/L (ref 1.12–1.23)
HCT: 45 % (ref 36.0–46.0)
TCO2: 25 mmol/L (ref 0–100)

## 2013-09-26 NOTE — ED Provider Notes (Signed)
Medical screening examination/treatment/procedure(s) were performed by non-physician practitioner and as supervising physician I was immediately available for consultation/collaboration.  EKG Interpretation   None         Valkyrie Guardiola, MD 09/26/13 0707 

## 2013-09-26 NOTE — ED Provider Notes (Signed)
CSN: [id number]     Arrival date & time 09/25/13  2357 History   First MD Initiated Contact with Patient 09/26/13 0118     Chief Complaint  Patient presents with  . Leg Pain   (Consider location/radiation/quality/duration/timing/severity/associated sxs/prior Treatment) HPI Comments: She presents today with anterior bilateral leg cramping from her foot to her knee on the left and from her foot to her mid thigh on her right.  This is been ongoing for months.  She also reports that her blood sugars have been out of control, usually in the 2-300 range.  She is working with her physician to get this under better control.  She also reports, that she's been dieting and has lost 70 pounds, but still is morbidly obese.  She denies any swelling, or trauma to her legs.  She has not tried any therapy  Patient is a 37 y.o. female presenting with leg pain. The history is provided by the patient.  Leg Pain Location:  Leg Leg location:  L leg and R leg Pain details:    Quality:  Aching and cramping   Severity:  Mild   Onset quality:  Unable to specify   Timing:  Intermittent Chronicity:  Recurrent Dislocation: no   Prior injury to area:  Yes Relieved by:  None tried Ineffective treatments:  None tried Associated symptoms: no decreased ROM, no fever, no itching, no muscle weakness, no swelling and no tingling     Past Medical History  Diagnosis Date  . Morbid obesity   . Alveolar hypoventilation   . Asthma   . GERD (gastroesophageal reflux disease)   . Rectal fissure   . Hypertension   . Costochondritis   . Cellulitis 08/2010-08/2011  . Depression   . Bipolar 2 disorder   . Obstructive sleep apnea   . Chest pain     NO CAD by cath 07/27/12, nl LV systolic fxn  . Diabetes mellitus 2000    Type 2, Uncontrolled  . Arthritis   . HLD (hyperlipidemia)   . Anemia   . COPD (chronic obstructive pulmonary disease)    Past Surgical History  Procedure Laterality Date  . Carpal tunnel release     . Bil knee surgery    . Mass excision N/A 06/29/2013    Procedure:  WIDE LOCAL EXCISION OF POSTERIOR NECK ABSCESS;  Surgeon: Axel Filler, MD;  Location: [facility] OR;  Service: General;  Laterality: N/A;   Family History  Problem Relation Age of Onset  . Diabetes Mother   . Hyperlipidemia Mother   . Depression Mother   . Heart attack Paternal Uncle   . Heart disease Paternal Grandmother   . Heart attack Paternal Grandmother   . Heart attack Paternal Grandfather   . Heart disease Paternal Grandfather    History  Substance Use Topics  . Smoking status: Former Smoker -- 0.30 packs/day for .3 years    Types: Cigarettes    Quit date: 12/06/1993  . Smokeless tobacco: Never Used  . Alcohol Use: No   OB History   Grav Para Term Preterm Abortions TAB SAB Ect Mult Living                 Review of Systems  Unable to perform ROS Constitutional: Negative for fever.  Respiratory: Negative for shortness of breath.   Cardiovascular: Negative for leg swelling.  Musculoskeletal: Positive for myalgias. Negative for joint swelling.  Skin: Negative for itching, rash and wound.  Neurological: Negative for dizziness, weakness and  numbness.  All other systems reviewed and are negative.    Allergies  Kiwi extract and Nubain  Home Medications   Current Outpatient Rx  Name  Route  Sig  Dispense  Refill  . atorvastatin (LIPITOR) 20 MG tablet   Oral   Take 1 tablet (20 mg total) by mouth daily.   30 tablet   3   . BYETTA 5 MCG PEN 5 MCG/0.02ML SOPN injection      INJECT 0.02 MLS (5 MCG TOTAL) SUBCUTANEOUSLY TWICE DAILY WITH A MEAL   1.2 mL   0   . carvedilol (COREG) 12.5 MG tablet      TAKE 1 TABLET BY MOUTH TWICE DAILY WITH A MEAL   60 tablet   3   . HYDROcodone-acetaminophen (NORCO) 10-325 MG per tablet   Oral   Take 1 tablet by mouth every 8 (eight) hours as needed for pain.   90 tablet   0     DO NOT FILL BEFORE 09/24/2013   . insulin aspart (NOVOLOG) 100 UNIT/ML  injection   Subcutaneous   Inject 55 Units into the skin 3 (three) times daily with meals.         . Insulin Glargine (LANTUS SOLOSTAR) 100 UNIT/ML SOPN   Subcutaneous   Inject 115 Units into the skin 2 (two) times daily.   5 pen   10   . lisinopril (PRINIVIL,ZESTRIL) 10 MG tablet   Oral   Take 10 mg by mouth daily.           . metFORMIN (GLUCOPHAGE) 500 MG tablet   Oral   Take 1 tablet (500 mg total) by mouth 2 (two) times daily with a meal.   180 tablet   1   . nystatin (MYCOSTATIN) powder   Topical   Apply 1 Units topically daily. Applies under stomach         . pantoprazole (PROTONIX) 40 MG tablet   Oral   Take 1 tablet (40 mg total) by mouth daily.   90 tablet   1   . SYMBICORT 160-4.5 MCG/ACT inhaler      INHALE 2 PUFFS INTO LUNGS TWICE DAILY   10.2 g   3   . traMADol (ULTRAM) 50 MG tablet   Oral   Take 50 mg by mouth every 8 (eight) hours as needed for pain.         . cetirizine (ZYRTEC) 10 MG tablet   Oral   Take 1 tablet (10 mg total) by mouth daily as needed for allergies.   30 tablet   3   . clindamycin (CLEOCIN) 300 MG capsule   Oral   Take 1 capsule (300 mg total) by mouth 3 (three) times daily.   21 capsule   0   . cyclobenzaprine (FLEXERIL) 10 MG tablet   Oral   Take 1 tablet (10 mg total) by mouth daily as needed for muscle spasms.   30 tablet   0     Do not fill before 09/24/2013   . glucose blood (ACCU-CHEK AVIVA) test strip      Check blood sugars  before and after meals and at bedtime.   200 each   2   . glucose blood (ACCU-CHEK SMARTVIEW) test strip      Use as instructed to test up to 5 times daily.   Dispense Qs for 5 times daily testing.   100 each   12   . glucose blood (ACCU-CHEK SMARTVIEW) test strip  Use as instructed for up to 5 times daily testing.  Disp QS for 5x daily testing   100 each   12   . Insulin Pen Needle (NOVOFINE) 30G X 8 MM MISC   Subcutaneous   Inject 10 each into the skin as  needed. QS for 7 shots daily   1 packet   11   . nitroGLYCERIN (NITROSTAT) 0.4 MG SL tablet   Sublingual   Place 1 tablet (0.4 mg total) under the tongue every 5 (five) minutes as needed for chest pain.   50 tablet   3   . PROAIR HFA 108 (90 BASE) MCG/ACT inhaler      INHALE 2 PUFFS BY MOUTH EVERY 6 HOURS AS NEEDED FOR WHEEZING   8.5 g   5   . zolpidem (AMBIEN) 5 MG tablet      TAKE 1 TABLET BY MOUTH EVERY NIGHT AT BEDTIME AS NEEDED FOR INSOMNIA   30 tablet   0    BP 154/101  Pulse 108  Temp(Src) 97.9 F (36.6 C) (Oral)  Resp 22  Wt 322 lb 6 oz (146.228 kg)  BMI 58.95 kg/m2  SpO2 96%  LMP 08/29/2013 Physical Exam  Nursing note and vitals reviewed. Constitutional: She appears well-developed and well-nourished.  Cardiovascular: Normal rate and regular rhythm.   Pulmonary/Chest: Effort normal.  Musculoskeletal: She exhibits no edema and no tenderness.  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  No erythema, swelling  Neurological: She is alert.  Skin: Skin is warm.    ED Course  Procedures (including critical care time) Labs Review Labs Reviewed  POCT I-STAT, CHEM 8 - Abnormal; Notable for the following:    Sodium 134 (*)    Chloride 95 (*)    Glucose, Bld 421 (*)    Hemoglobin 15.3 (*)    All other components within normal limits   Imaging Review No results found.  EKG Interpretation   None       MDM   1. Cramps, extremity   2. Hyperglycemia     Patient's i-STAT was checked, within normal limits, although her blood sugar is 421.  This was discussed with the patient and she states she is working with her primary care physician to get this under better control.  She ate just prior to arrival in the emergency department, that her blood sugars normally.  Her between 250 and 350.  She has been placed in TED hose to help the discomfort in her legs.  It is unclear whether this is to to her blood sugars being out of control or her varicosities.  She's  been instructed to followup with her primary care physician    Arman Filter, NP 09/26/13 0128

## 2013-09-26 NOTE — ED Notes (Addendum)
Presets with bilateral leg pain from foot to knee anterior, denies calf pain. Pain began 3 days and is not controlled with Tramadol, flexeril or hydrocodone. Pain is described as cramping. No edema, multiple vericose veins. CMS intact.  Nothing makes pain better, nothing makes pain worse

## 2013-09-28 ENCOUNTER — Other Ambulatory Visit: Payer: Self-pay | Admitting: Family Medicine

## 2013-10-15 ENCOUNTER — Ambulatory Visit (INDEPENDENT_AMBULATORY_CARE_PROVIDER_SITE_OTHER): Payer: Medicaid Other | Admitting: Emergency Medicine

## 2013-10-15 VITALS — BP 142/90 | HR 96 | Ht 62.0 in | Wt 323.0 lb

## 2013-10-15 DIAGNOSIS — M25561 Pain in right knee: Secondary | ICD-10-CM

## 2013-10-15 DIAGNOSIS — N3941 Urge incontinence: Secondary | ICD-10-CM | POA: Insufficient documentation

## 2013-10-15 DIAGNOSIS — IMO0001 Reserved for inherently not codable concepts without codable children: Secondary | ICD-10-CM

## 2013-10-15 DIAGNOSIS — IMO0002 Reserved for concepts with insufficient information to code with codable children: Secondary | ICD-10-CM

## 2013-10-15 DIAGNOSIS — R3915 Urgency of urination: Secondary | ICD-10-CM

## 2013-10-15 DIAGNOSIS — E1165 Type 2 diabetes mellitus with hyperglycemia: Secondary | ICD-10-CM

## 2013-10-15 DIAGNOSIS — M25569 Pain in unspecified knee: Secondary | ICD-10-CM

## 2013-10-15 LAB — POCT GLYCOSYLATED HEMOGLOBIN (HGB A1C): Hemoglobin A1C: 12.4

## 2013-10-15 MED ORDER — HYDROCODONE-ACETAMINOPHEN 10-325 MG PO TABS: 1.0000 | ORAL_TABLET | Freq: Three times a day (TID) | ORAL | Status: DC | PRN

## 2013-10-15 NOTE — Assessment & Plan Note (Signed)
>>  ASSESSMENT AND PLAN FOR INSULIN DEPENDENT TYPE 2 DIABETES MELLITUS ([name]) WRITTEN ON 10/15/2013 11:09 AM BY HONIG, ERIN J, MD  A1c 12.4 today. Continue current lantus dose. Increase Novolog to 60 units with meals. Follow up in 1 month with PCP.

## 2013-10-15 NOTE — Assessment & Plan Note (Signed)
Stable. Refilled Nocro 10-325 #90.

## 2013-10-15 NOTE — Progress Notes (Signed)
  Subjective:    Patient ID: [name], female    DOB: 1976/11/26, 37 y.o.   MRN: [id number]  HPI [name] is here for med refill and diabetes.  Med refill Takes norco TID for bilateral knee pain from arthritis.  She states she has noticed some improvement with the weight loss, but not enough to decrease the pain medicine.  Diabetes Compliant with medications: yes Side effects from medications: no Check sugars at home: yes  Sugar ranges: Fasting arougn 100; Post prandial around 250  Polyuria: yes - at baseline Polydipsia: no Vision changes: no Hypoglycemic symptoms: no  Urge Incontinence Gradually worsening over the last year.  States that when she has to go, she has to go right away or else she will pee on herself.  She urinates about every hour during the day.  No leakage with cough or sneezing.  No dysuria.  I have reviewed and updated the following as appropriate: allergies and current medications SHx: former smoker  Review of Systems See HPI    Objective:   Physical Exam BP 142/90  Pulse 96  Ht 5\' 2"  (1.575 m)  Wt 323 lb (146.512 kg)  BMI 59.06 kg/m2  LMP 10/11/2013 Gen: alert, cooperative, NAD, morbid obesity HEENT: AT/[place], sclera white, MMM Neck: supple CV: RRR, no murmurs Pulm: CTAB, no wheezes or rales     Assessment & Plan:

## 2013-10-15 NOTE — Assessment & Plan Note (Signed)
A1c 12.4 today. Continue current lantus dose. Increase Novolog to 60 units with meals. Follow up in 1 month with PCP.

## 2013-10-15 NOTE — Assessment & Plan Note (Signed)
>>  ASSESSMENT AND PLAN FOR CHRONIC PAIN OF BOTH KNEES WRITTEN ON 10/15/2013 10:16 AM BY HONIG, Finis Bud, MD  Stable. Refilled Nocro 10-325 #90.

## 2013-10-15 NOTE — Patient Instructions (Signed)
It was nice to see you!  Please do the Kegel exercises to help with bladder control.  Your A1c is 12.4% today.  It is getting better! Please increase the Novolog to 60 units with meals. If you start experiencing low blood sugars, decrease it to 57 units with meals.  Follow up with Dr. Pollie Meyer in the next month.

## 2013-10-15 NOTE — Assessment & Plan Note (Signed)
Discussed etiology with patient. Likely with components from elevated sugars, obesity and weak pelvic floor. Encouraged continued weight loss. Diabetes management as in problem list. Handout on Kegel exercises given. F/u in 1 month.  Could consider starting oxybutynin.

## 2013-10-22 ENCOUNTER — Other Ambulatory Visit: Payer: Self-pay | Admitting: Family Medicine

## 2013-10-25 ENCOUNTER — Other Ambulatory Visit: Payer: Self-pay | Admitting: Family Medicine

## 2013-11-01 ENCOUNTER — Other Ambulatory Visit: Payer: Self-pay | Admitting: Family Medicine

## 2013-11-01 MED ORDER — TRAMADOL HCL 50 MG PO TABS: 50.0000 mg | ORAL_TABLET | Freq: Every day | ORAL | Status: DC | PRN

## 2013-11-01 MED ORDER — ZOLPIDEM TARTRATE 5 MG PO TABS: ORAL_TABLET | ORAL | Status: DC

## 2013-11-01 MED ORDER — CYCLOBENZAPRINE HCL 10 MG PO TABS: 10.0000 mg | ORAL_TABLET | Freq: Every day | ORAL | Status: DC | PRN

## 2013-11-01 NOTE — Telephone Encounter (Signed)
Called pt to let her know that I am unable to send in her controlled substances today bc her pharmacy is closed today for the Holiday. She is okay with waiting until Monday for refills on her tramadol, flexeril, and ambien. I will send these in on Monday. Pt appreciative.  Latrelle Dodrill, MD

## 2013-11-05 ENCOUNTER — Other Ambulatory Visit: Payer: Self-pay | Admitting: Family Medicine

## 2013-11-05 MED ORDER — ZOLPIDEM TARTRATE 5 MG PO TABS: ORAL_TABLET | ORAL | Status: DC

## 2013-11-05 MED ORDER — CYCLOBENZAPRINE HCL 10 MG PO TABS: 10.0000 mg | ORAL_TABLET | Freq: Every day | ORAL | Status: DC | PRN

## 2013-11-05 MED ORDER — TRAMADOL HCL 50 MG PO TABS: 50.0000 mg | ORAL_TABLET | Freq: Every day | ORAL | Status: DC | PRN

## 2013-11-15 ENCOUNTER — Other Ambulatory Visit: Payer: Self-pay | Admitting: Family Medicine

## 2013-11-16 ENCOUNTER — Ambulatory Visit (INDEPENDENT_AMBULATORY_CARE_PROVIDER_SITE_OTHER): Payer: Medicaid Other | Admitting: Family Medicine

## 2013-11-16 ENCOUNTER — Encounter: Payer: Self-pay | Admitting: Family Medicine

## 2013-11-16 VITALS — BP 157/109 | HR 111 | Temp 97.7°F | Wt 328.0 lb

## 2013-11-16 DIAGNOSIS — IMO0002 Reserved for concepts with insufficient information to code with codable children: Secondary | ICD-10-CM

## 2013-11-16 DIAGNOSIS — G4733 Obstructive sleep apnea (adult) (pediatric): Secondary | ICD-10-CM

## 2013-11-16 DIAGNOSIS — E1165 Type 2 diabetes mellitus with hyperglycemia: Secondary | ICD-10-CM

## 2013-11-16 DIAGNOSIS — IMO0001 Reserved for inherently not codable concepts without codable children: Secondary | ICD-10-CM

## 2013-11-16 DIAGNOSIS — G8929 Other chronic pain: Secondary | ICD-10-CM

## 2013-11-16 DIAGNOSIS — Z7189 Other specified counseling: Secondary | ICD-10-CM

## 2013-11-16 DIAGNOSIS — N3941 Urge incontinence: Secondary | ICD-10-CM

## 2013-11-16 MED ORDER — HYDROCODONE-ACETAMINOPHEN 10-325 MG PO TABS: 1.0000 | ORAL_TABLET | Freq: Three times a day (TID) | ORAL | Status: DC | PRN

## 2013-11-16 MED ORDER — ZOLPIDEM TARTRATE 5 MG PO TABS: ORAL_TABLET | ORAL | Status: DC

## 2013-11-16 MED ORDER — CYCLOBENZAPRINE HCL 10 MG PO TABS: 10.0000 mg | ORAL_TABLET | Freq: Every day | ORAL | Status: DC | PRN

## 2013-11-16 MED ORDER — TRAMADOL HCL 50 MG PO TABS: 50.0000 mg | ORAL_TABLET | Freq: Every day | ORAL | Status: DC | PRN

## 2013-11-16 NOTE — Patient Instructions (Addendum)
It was great to see you again today!  Diabetes - increase novolog to 65 units with meals. Cut back to 60 if you get low sugars, feel lightheaded, or any other problems.  Sleep - I am ordering a CPAP titration study for you. You will get called to get this set up. See handout below on overactive bladder.  Pain medicines - I have refilled these today. Follow up in 2 months for chronic pain.  I recommend you do not get pregnant until your diabetes is better controlled. Several of your current medicines cannot be taken during pregnancy.  Come back to see me in 2-3 weeks to see how your sugars are doing.  Be well, Dr. Pollie Meyer

## 2013-11-16 NOTE — Progress Notes (Signed)
Patient ID: [name], female   DOB: Jun 08, 1976, 37 y.o.   MRN: [id number]  HPI:  Chronic pain: has been walking still to lose weight, although does note weight has gone up a few lbs since last clinic visit. Takes norco 3 times per day, flexeril once per day, and tramadol once per day. Pain control is adequate with this medication regimen.   Sleep apnea: currently uses CPAP. Went to [facility] recently to get a new mask for her CPAP and they advised her to see a sleep doctor due to the large range of her CPAP setting (currently between 5-20). Her last sleep study was several years ago. She takes [place] not every single day. Is willing to cut back the # she gets in half. It takes her a while to go to sleep when she lays down, and then once she lays down and is asleep she gets up multiple times during the night to urinate. Denies any dysuria.   Diabetes: checks her CBG's in the morning and 2 hrs postprandial. In the morning the lowest she gets is 75. Postprandials are 170s-200s. Currently takes novolog 60 units TID with meals and Lantus 115 units BID.   ROS: See HPI  PMFSH: hx uncontrolled diabetes. She is not on anything currently for birth control. Counseled her on incompatibility of several of her medications with pregnancy, and recommendation to wait until diabetes is better controlled.   PHYSICAL EXAM: BP 157/109  Pulse 111  Temp(Src) 97.7 F (36.5 C) (Oral)  Wt 328 lb (148.78 kg)  LMP 10/17/2013 Gen: NAD HEENT: NCAT Heart: RRR Lungs: CTAB Abdomen: obese Neuro: grossly nonfocal, speech intact Ext: marked obesity prohibits complete knee exam. Full strength in bilateral legs. No joint effusion or redness noted in either knee. nontender to palpation over surface of knees.  ASSESSMENT/PLAN:  See problem based charting for assessment/plan.   FOLLOW UP: F/u in 2-3 weeks for insulin titration.  [place] J. Pollie Meyer, MD [facility]

## 2013-11-18 ENCOUNTER — Encounter: Payer: Self-pay | Admitting: Family Medicine

## 2013-11-18 NOTE — Assessment & Plan Note (Signed)
Pain well managed. Refilled norco, flexeril, and tramadol x 2 months worth. Pt and I have an expectation that at each visit we will cut back on one of her controlled substances. Today that decrease will be in Ambien. Will decrease amount prescribed to #15 (was receiving 30). Will not change pain medications today.

## 2013-11-18 NOTE — Assessment & Plan Note (Signed)
As postprandials are high 100s-200s, will increase mealtime coverage to 65 units TID. Continue current dose of lantus (fastings are 75). F/u in 2-3 weeks for further insulin titration.

## 2013-11-18 NOTE — Assessment & Plan Note (Signed)
Likely multifactorial, related to obesity and uncontrolled diabetes. Will give handout on overactive bladder and encourage decreased liquid consumption prior to bedtime. Would like to avoid starting pt on another medication, so will attempt lifestyle modification first.

## 2013-11-18 NOTE — Assessment & Plan Note (Signed)
CPAP settings widely vary according to pt. Will send for dedicated CPAP titration study.

## 2013-11-18 NOTE — Assessment & Plan Note (Signed)
>>  ASSESSMENT AND PLAN FOR INSULIN DEPENDENT TYPE 2 DIABETES MELLITUS ([name]) WRITTEN ON 11/18/2013 11:27 PM BY Cherre Kothari, Estevan Ryder, MD  As postprandials are high 100s-200s, will increase mealtime coverage to 65 units TID. Continue current dose of lantus (fastings are 75). F/u in 2-3 weeks for further insulin titration.

## 2013-11-20 ENCOUNTER — Ambulatory Visit: Payer: Medicaid Other | Admitting: Pharmacist

## 2013-11-22 ENCOUNTER — Other Ambulatory Visit: Payer: Self-pay | Admitting: Family Medicine

## 2013-11-23 ENCOUNTER — Other Ambulatory Visit: Payer: Self-pay | Admitting: Family Medicine

## 2013-12-03 ENCOUNTER — Encounter (HOSPITAL_COMMUNITY): Payer: Self-pay | Admitting: Emergency Medicine

## 2013-12-03 ENCOUNTER — Emergency Department (HOSPITAL_COMMUNITY): Payer: Medicaid Other

## 2013-12-03 ENCOUNTER — Emergency Department (HOSPITAL_COMMUNITY)
Admission: EM | Admit: 2013-12-03 | Discharge: 2013-12-03 | Disposition: A | Discharge status: Home / Self Care | Payer: Medicaid Other | Attending: Emergency Medicine | Admitting: Emergency Medicine

## 2013-12-03 DIAGNOSIS — Z79899 Other long term (current) drug therapy: Secondary | ICD-10-CM | POA: Insufficient documentation

## 2013-12-03 DIAGNOSIS — K219 Gastro-esophageal reflux disease without esophagitis: Secondary | ICD-10-CM | POA: Insufficient documentation

## 2013-12-03 DIAGNOSIS — J4489 Other specified chronic obstructive pulmonary disease: Secondary | ICD-10-CM | POA: Insufficient documentation

## 2013-12-03 DIAGNOSIS — E785 Hyperlipidemia, unspecified: Secondary | ICD-10-CM | POA: Insufficient documentation

## 2013-12-03 DIAGNOSIS — I10 Essential (primary) hypertension: Secondary | ICD-10-CM | POA: Insufficient documentation

## 2013-12-03 DIAGNOSIS — E119 Type 2 diabetes mellitus without complications: Secondary | ICD-10-CM | POA: Insufficient documentation

## 2013-12-03 DIAGNOSIS — Z862 Personal history of diseases of the blood and blood-forming organs and certain disorders involving the immune mechanism: Secondary | ICD-10-CM | POA: Insufficient documentation

## 2013-12-03 DIAGNOSIS — Z3202 Encounter for pregnancy test, result negative: Secondary | ICD-10-CM | POA: Insufficient documentation

## 2013-12-03 DIAGNOSIS — R110 Nausea: Secondary | ICD-10-CM | POA: Insufficient documentation

## 2013-12-03 DIAGNOSIS — Z872 Personal history of diseases of the skin and subcutaneous tissue: Secondary | ICD-10-CM | POA: Insufficient documentation

## 2013-12-03 DIAGNOSIS — Z8659 Personal history of other mental and behavioral disorders: Secondary | ICD-10-CM | POA: Insufficient documentation

## 2013-12-03 DIAGNOSIS — Z87891 Personal history of nicotine dependence: Secondary | ICD-10-CM | POA: Insufficient documentation

## 2013-12-03 DIAGNOSIS — M129 Arthropathy, unspecified: Secondary | ICD-10-CM | POA: Insufficient documentation

## 2013-12-03 DIAGNOSIS — J449 Chronic obstructive pulmonary disease, unspecified: Secondary | ICD-10-CM | POA: Insufficient documentation

## 2013-12-03 DIAGNOSIS — Z794 Long term (current) use of insulin: Secondary | ICD-10-CM | POA: Insufficient documentation

## 2013-12-03 DIAGNOSIS — R109 Unspecified abdominal pain: Secondary | ICD-10-CM | POA: Insufficient documentation

## 2013-12-03 LAB — URINALYSIS, ROUTINE W REFLEX MICROSCOPIC
Bilirubin Urine: NEGATIVE
Glucose, UA: >1000 mg/dL — AB
Hgb urine dipstick: NEGATIVE
Protein, ur: >300 mg/dL — AB
Specific Gravity, Urine: 1.045 — ABNORMAL HIGH (ref 1.005–1.030)
Urobilinogen, UA: 0.2 mg/dL (ref 0.0–1.0)

## 2013-12-03 LAB — CBC WITH DIFFERENTIAL/PLATELET
Basophils Absolute: 0 10*3/uL (ref 0.0–0.1)
Eosinophils Absolute: 0.1 10*3/uL (ref 0.0–0.7)
Eosinophils Relative: 1 % (ref 0–5)
Hemoglobin: 15.7 g/dL — ABNORMAL HIGH (ref 12.0–15.0)
Lymphs Abs: 4.3 10*3/uL — ABNORMAL HIGH (ref 0.7–4.0)
MCH: 31.1 pg (ref 26.0–34.0)
MCV: 87.9 fL (ref 78.0–100.0)
Monocytes Relative: 6 % (ref 3–12)
Neutro Abs: 4.8 10*3/uL (ref 1.7–7.7)
RBC: 5.05 MIL/uL (ref 3.87–5.11)

## 2013-12-03 LAB — POCT I-STAT, CHEM 8
BUN: 9 mg/dL (ref 6–23)
Calcium, Ion: 1.16 mmol/L (ref 1.12–1.23)
Creatinine, Ser: 0.5 mg/dL (ref 0.50–1.10)
Potassium: 4.4 mEq/L (ref 3.5–5.1)
Sodium: 136 mEq/L (ref 135–145)

## 2013-12-03 MED ORDER — HYDROMORPHONE HCL PF 1 MG/ML IJ SOLN
1.0000 mg | Freq: Once | INTRAMUSCULAR | Status: AC
  Administered 2013-12-03: 1 mg via INTRAVENOUS
  Filled 2013-12-03: qty 1

## 2013-12-03 MED ORDER — ONDANSETRON HCL 4 MG/2ML IJ SOLN
4.0000 mg | Freq: Once | INTRAMUSCULAR | Status: AC
  Administered 2013-12-03: 4 mg via INTRAVENOUS
  Filled 2013-12-03: qty 2

## 2013-12-03 MED ORDER — KETOROLAC TROMETHAMINE 30 MG/ML IJ SOLN
30.0000 mg | Freq: Once | INTRAMUSCULAR | Status: AC
  Administered 2013-12-03: 30 mg via INTRAVENOUS
  Filled 2013-12-03: qty 1

## 2013-12-03 MED ORDER — SODIUM CHLORIDE 0.9 % IV SOLN
INTRAVENOUS | Status: DC
  Administered 2013-12-03: 17:00:00 via INTRAVENOUS

## 2013-12-03 MED ORDER — SODIUM CHLORIDE 0.9 % IV BOLUS (SEPSIS)
500.0000 mL | Freq: Once | INTRAVENOUS | Status: AC
  Administered 2013-12-03: 500 mL via INTRAVENOUS

## 2013-12-03 MED ORDER — HYDROMORPHONE HCL PF 1 MG/ML IJ SOLN
0.5000 mg | Freq: Once | INTRAMUSCULAR | Status: AC
  Administered 2013-12-03: 0.5 mg via INTRAVENOUS
  Filled 2013-12-03: qty 1

## 2013-12-03 MED ORDER — OXYCODONE-ACETAMINOPHEN 5-325 MG PO TABS: 1.0000 | ORAL_TABLET | Freq: Four times a day (QID) | ORAL | Status: DC | PRN

## 2013-12-03 NOTE — ED Provider Notes (Signed)
CSN: [id number]     Arrival date & time 12/03/13  1325 History   First MD Initiated Contact with Patient 12/03/13 1538     Chief Complaint  Patient presents with  . Flank Pain   (Consider location/radiation/quality/duration/timing/severity/associated sxs/prior Treatment) Patient is a 37 y.o. female presenting with flank pain. The history is provided by the patient.  Flank Pain This is a new problem. Pertinent negatives include no chest pain, no abdominal pain, no headaches and no shortness of breath.   patient with left-sided flank pain. Began this morning. No dysuria. No hematuria. No fevers. She's had nausea vomiting. No diarrhea or constipation. No fevers. She states no positional make it better. No trauma. She's not had pains like this before.  Past Medical History  Diagnosis Date  . Morbid obesity   . Alveolar hypoventilation   . Asthma   . GERD (gastroesophageal reflux disease)   . Rectal fissure   . Hypertension   . Costochondritis   . Cellulitis 08/2010-08/2011  . Depression   . Bipolar 2 disorder   . Obstructive sleep apnea   . Chest pain     NO CAD by cath 07/27/12, nl LV systolic fxn  . Diabetes mellitus 2000    Type 2, Uncontrolled  . Arthritis   . HLD (hyperlipidemia)   . Anemia   . COPD (chronic obstructive pulmonary disease)    Past Surgical History  Procedure Laterality Date  . Carpal tunnel release    . Bil knee surgery    . Mass excision N/A 06/29/2013    Procedure:  WIDE LOCAL EXCISION OF POSTERIOR NECK ABSCESS;  Surgeon: Axel Filler, MD;  Location: [facility] OR;  Service: General;  Laterality: N/A;   Family History  Problem Relation Age of Onset  . Diabetes Mother   . Hyperlipidemia Mother   . Depression Mother   . Heart attack Paternal Uncle   . Heart disease Paternal Grandmother   . Heart attack Paternal Grandmother   . Heart attack Paternal Grandfather   . Heart disease Paternal Grandfather    History  Substance Use Topics  . Smoking status:  Former Smoker -- 0.30 packs/day for .3 years    Types: Cigarettes    Quit date: 12/06/1993  . Smokeless tobacco: Never Used  . Alcohol Use: No   OB History   Grav Para Term Preterm Abortions TAB SAB Ect Mult Living                 Review of Systems  Constitutional: Negative for activity change and appetite change.  Eyes: Negative for pain.  Respiratory: Negative for chest tightness and shortness of breath.   Cardiovascular: Negative for chest pain and leg swelling.  Gastrointestinal: Positive for nausea. Negative for vomiting, abdominal pain and diarrhea.  Genitourinary: Positive for flank pain. Negative for dysuria, hematuria, difficulty urinating and menstrual problem.  Musculoskeletal: Negative for back pain and neck stiffness.  Skin: Negative for rash.  Neurological: Negative for weakness, numbness and headaches.  Psychiatric/Behavioral: Negative for behavioral problems.    Allergies  Kiwi extract and Nubain  Home Medications   Current Outpatient Rx  Name  Route  Sig  Dispense  Refill  . ACCU-CHEK FASTCLIX LANCETS MISC      USE AS DIRECTED FOUR TIMES A DAY   204 each   3   . atorvastatin (LIPITOR) 20 MG tablet   Oral   Take 1 tablet (20 mg total) by mouth daily.   30 tablet   3   .  BYETTA 5 MCG PEN 5 MCG/0.02ML SOPN injection      INJECT 0.02 MLS (5 MCG TOTAL) SUBCUTANEOUSLY TWICE DAILY WITH A MEAL   1.2 mL   2   . carvedilol (COREG) 12.5 MG tablet      TAKE 1 TABLET BY MOUTH TWICE DAILY WITH A MEAL   60 tablet   3   . cetirizine (ZYRTEC) 10 MG tablet   Oral   Take 1 tablet (10 mg total) by mouth daily as needed for allergies.   30 tablet   3   . cyclobenzaprine (FLEXERIL) 10 MG tablet      Take one tablet by mouth daily as needed for muscle spasm.   30 tablet   2   . glucose blood (ACCU-CHEK AVIVA) test strip      Check blood sugars  before and after meals and at bedtime.   200 each   2   . glucose blood (ACCU-CHEK SMARTVIEW) test strip       Use as instructed to test up to 5 times daily.   Dispense Qs for 5 times daily testing.   100 each   12   . glucose blood (ACCU-CHEK SMARTVIEW) test strip      Use as instructed for up to 5 times daily testing.  Disp QS for 5x daily testing   100 each   12   . HYDROcodone-acetaminophen (NORCO) 10-325 MG per tablet   Oral   Take 1 tablet by mouth every 8 (eight) hours as needed.   90 tablet   0     Fill 30 days after last fill   . insulin aspart (NOVOLOG) 100 UNIT/ML injection   Subcutaneous   Inject 60 Units into the skin 3 (three) times daily with meals.          . Insulin Glargine (LANTUS SOLOSTAR) 100 UNIT/ML SOPN   Subcutaneous   Inject 115 Units into the skin 2 (two) times daily.   5 pen   10   . Insulin Pen Needle (NOVOFINE) 30G X 8 MM MISC   Subcutaneous   Inject 10 each into the skin as needed. QS for 7 shots daily   1 packet   11   . lisinopril (PRINIVIL,ZESTRIL) 10 MG tablet   Oral   Take 10 mg by mouth daily.           . metFORMIN (GLUCOPHAGE) 500 MG tablet      TAKE 1 TABLET BY MOUTH TWICE DAILY WITH A MEAL   60 tablet   2   . nitroGLYCERIN (NITROSTAT) 0.4 MG SL tablet   Sublingual   Place 1 tablet (0.4 mg total) under the tongue every 5 (five) minutes as needed for chest pain.   50 tablet   3   . nystatin (MYCOSTATIN) powder   Topical   Apply 1 Units topically daily. Applies under stomach         . pantoprazole (PROTONIX) 40 MG tablet      TAKE 1 TABLET BY MOUTH EVERY DAY   90 tablet   1   . PROAIR HFA 108 (90 BASE) MCG/ACT inhaler      INHALE 2 PUFFS BY MOUTH EVERY 6 HOURS AS NEEDED FOR WHEEZING   8.5 g   5   . SYMBICORT 160-4.5 MCG/ACT inhaler      INHALE 2 PUFFS INTO LUNGS TWICE DAILY   10.2 g   3   . traMADol (ULTRAM) 50 MG tablet  TAKE 1 TABLET BY MOUTH EVERY DAY AS NEEDED FOR PAIN   30 tablet   2   . zolpidem (AMBIEN) 5 MG tablet      TAKE 1 TABLET BY MOUTH EVERY NIGHT AT BEDTIME AS NEEDED FOR INSOMNIA    15 tablet   2   . oxyCODONE-acetaminophen (PERCOCET/ROXICET) 5-325 MG per tablet   Oral   Take 1-2 tablets by mouth every 6 (six) hours as needed for severe pain.   10 tablet   0    BP 136/86  Pulse 99  Temp(Src) 98.3 F (36.8 C) (Oral)  Resp 18  Ht 5\' 2"  (1.575 m)  Wt 327 lb (148.326 kg)  BMI 59.79 kg/m2  SpO2 95%  LMP 10/17/2013 Physical Exam  Nursing note and vitals reviewed. Constitutional: She is oriented to person, place, and time. She appears well-developed and well-nourished.  Patient appears uncomfortable. She is morbidly obese  HENT:  Head: Normocephalic and atraumatic.  Eyes: EOM are normal. Pupils are equal, round, and reactive to light.  Neck: Normal range of motion. Neck supple.  Cardiovascular: Normal rate, regular rhythm and normal heart sounds.   No murmur heard. Pulmonary/Chest: Effort normal and breath sounds normal. No respiratory distress. She has no wheezes. She has no rales.  Abdominal: Soft. Bowel sounds are normal. She exhibits no distension. There is no tenderness. There is no rebound and no guarding.  Genitourinary:  CVA tenderness in left flank area  Musculoskeletal: Normal range of motion.  Neurological: She is alert and oriented to person, place, and time. No cranial nerve deficit.  Skin: Skin is warm and dry. No rash noted.  Psychiatric: She has a normal mood and affect. Her speech is normal.    ED Course  Procedures (including critical care time) Labs Review Labs Reviewed  URINALYSIS, ROUTINE W REFLEX MICROSCOPIC - Abnormal; Notable for the following:    Specific Gravity, Urine 1.045 (*)    Glucose, UA >1000 (*)    Ketones, ur 15 (*)    Protein, ur >300 (*)    All other components within normal limits  CBC WITH DIFFERENTIAL - Abnormal; Notable for the following:    Hemoglobin 15.7 (*)    Lymphs Abs 4.3 (*)    All other components within normal limits  URINE MICROSCOPIC-ADD ON - Abnormal; Notable for the following:    Squamous  Epithelial / LPF FEW (*)    Bacteria, UA FEW (*)    Casts HYALINE CASTS (*)    All other components within normal limits  POCT I-STAT, CHEM 8 - Abnormal; Notable for the following:    Glucose, Bld 353 (*)    Hemoglobin 17.0 (*)    HCT 50.0 (*)    All other components within normal limits  PREGNANCY, URINE   Imaging Review Ct Abdomen Pelvis Wo Contrast  12/03/2013   CLINICAL DATA:  Left-sided flank pain.  , hematuria  EXAM: CT ABDOMEN AND PELVIS WITHOUT CONTRAST  TECHNIQUE: Multidetector CT imaging of the abdomen and pelvis was performed following the standard protocol without intravenous contrast.  COMPARISON:  12/20/2012  FINDINGS: The lung bases are unremarkable. Noncontrast evaluation mediastinum is unremarkable.  There is diffuse areas of low attenuation are appreciated within the liver parenchyma.  There is no evidence of hydronephrosis, hydroureter, nephrolithiasis, no ureterolithiasis within the kidneys. Incomplete rotation of the right kidney is again appreciated and stable. .  The spleen, the spleen, left adrenal, pancreas are unremarkable. A stable 2.4 cm left adrenal nodule identified.  There is no evidence of bowel obstruction, enteritis, colitis, diverticulitis, nor secondary signs reflecting appendicitis within the limitations of a noncontrast CT. The appendix is identified and appears unremarkable. There is no evidence of an abdominal aortic aneurysm. There is no evidence of abdominal pelvic free fluid, loculated fluid collections, masses or adenopathy within the limitations of a noncontrast CT.  Evaluation of the pelvis demonstrates minimal diverticulosis in the sigmoid colon and otherwise unremarkable. No aggressive appearing osseous lesions identified.  IMPRESSION: No CT evidence of acute, obstructive, or inflammatory abnormalities. Areas of fatty infiltration within the liver parenchyma.   Electronically Signed   By: Salome Holmes M.D.   On: 12/03/2013 17:13    EKG Interpretation    None       MDM   1. Flank pain    Patient with flank pain. CT reassuring. urinalysis is reassuring. Mild hyperglycemia without DKA. His been given fluids. She has insulin at home. In his improved after Dilaudid and Toradol. Will discharge home. Patient states she has a pain contract and will followup with a call tomorrow to see if she can fill the prescription.    Juliet Rude. Rubin Payor, MD 12/03/13 [name]

## 2013-12-03 NOTE — ED Notes (Signed)
MD at bedside. 

## 2013-12-03 NOTE — ED Notes (Signed)
Pt reports left sided flank pain that began this am. No blood in urine. Pt tearful in triage. No fever, nausea/vomiting.

## 2013-12-07 ENCOUNTER — Encounter (HOSPITAL_COMMUNITY): Payer: Self-pay | Admitting: Emergency Medicine

## 2013-12-07 ENCOUNTER — Telehealth: Payer: Self-pay | Admitting: Family Medicine

## 2013-12-07 ENCOUNTER — Emergency Department (HOSPITAL_COMMUNITY)
Admission: EM | Admit: 2013-12-07 | Discharge: 2013-12-07 | Disposition: A | Discharge status: Home / Self Care | Payer: Medicaid Other | Attending: Emergency Medicine | Admitting: Emergency Medicine

## 2013-12-07 ENCOUNTER — Ambulatory Visit (HOSPITAL_BASED_OUTPATIENT_CLINIC_OR_DEPARTMENT_OTHER): Payer: Medicaid Other | Attending: Family Medicine | Admitting: Radiology

## 2013-12-07 VITALS — Ht 62.0 in | Wt 325.0 lb

## 2013-12-07 DIAGNOSIS — K219 Gastro-esophageal reflux disease without esophagitis: Secondary | ICD-10-CM | POA: Insufficient documentation

## 2013-12-07 DIAGNOSIS — G4730 Sleep apnea, unspecified: Principal | ICD-10-CM

## 2013-12-07 DIAGNOSIS — IMO0002 Reserved for concepts with insufficient information to code with codable children: Secondary | ICD-10-CM | POA: Insufficient documentation

## 2013-12-07 DIAGNOSIS — E785 Hyperlipidemia, unspecified: Secondary | ICD-10-CM | POA: Insufficient documentation

## 2013-12-07 DIAGNOSIS — M25579 Pain in unspecified ankle and joints of unspecified foot: Secondary | ICD-10-CM | POA: Insufficient documentation

## 2013-12-07 DIAGNOSIS — J449 Chronic obstructive pulmonary disease, unspecified: Secondary | ICD-10-CM | POA: Insufficient documentation

## 2013-12-07 DIAGNOSIS — Z862 Personal history of diseases of the blood and blood-forming organs and certain disorders involving the immune mechanism: Secondary | ICD-10-CM | POA: Insufficient documentation

## 2013-12-07 DIAGNOSIS — G8929 Other chronic pain: Secondary | ICD-10-CM | POA: Insufficient documentation

## 2013-12-07 DIAGNOSIS — Z872 Personal history of diseases of the skin and subcutaneous tissue: Secondary | ICD-10-CM | POA: Insufficient documentation

## 2013-12-07 DIAGNOSIS — E1165 Type 2 diabetes mellitus with hyperglycemia: Secondary | ICD-10-CM

## 2013-12-07 DIAGNOSIS — IMO0001 Reserved for inherently not codable concepts without codable children: Secondary | ICD-10-CM | POA: Insufficient documentation

## 2013-12-07 DIAGNOSIS — G4733 Obstructive sleep apnea (adult) (pediatric): Secondary | ICD-10-CM

## 2013-12-07 DIAGNOSIS — J4489 Other specified chronic obstructive pulmonary disease: Secondary | ICD-10-CM | POA: Insufficient documentation

## 2013-12-07 DIAGNOSIS — Z794 Long term (current) use of insulin: Secondary | ICD-10-CM | POA: Insufficient documentation

## 2013-12-07 DIAGNOSIS — M129 Arthropathy, unspecified: Secondary | ICD-10-CM | POA: Insufficient documentation

## 2013-12-07 DIAGNOSIS — I10 Essential (primary) hypertension: Secondary | ICD-10-CM | POA: Insufficient documentation

## 2013-12-07 DIAGNOSIS — G4710 Hypersomnia, unspecified: Secondary | ICD-10-CM | POA: Insufficient documentation

## 2013-12-07 DIAGNOSIS — F3189 Other bipolar disorder: Secondary | ICD-10-CM | POA: Insufficient documentation

## 2013-12-07 DIAGNOSIS — Z87891 Personal history of nicotine dependence: Secondary | ICD-10-CM | POA: Insufficient documentation

## 2013-12-07 DIAGNOSIS — Z79899 Other long term (current) drug therapy: Secondary | ICD-10-CM | POA: Insufficient documentation

## 2013-12-07 DIAGNOSIS — M792 Neuralgia and neuritis, unspecified: Secondary | ICD-10-CM

## 2013-12-07 MED ORDER — GABAPENTIN 300 MG PO CAPS
300.0000 mg | ORAL_CAPSULE | Freq: Once | ORAL | Status: AC
  Administered 2013-12-07: 300 mg via ORAL
  Filled 2013-12-07: qty 1

## 2013-12-07 MED ORDER — GABAPENTIN 300 MG PO CAPS: 300.0000 mg | ORAL_CAPSULE | Freq: Three times a day (TID) | ORAL | Status: DC

## 2013-12-07 MED ORDER — ONDANSETRON 4 MG PO TBDP
8.0000 mg | ORAL_TABLET | Freq: Once | ORAL | Status: AC
  Administered 2013-12-07: 8 mg via ORAL
  Filled 2013-12-07: qty 2

## 2013-12-07 MED ORDER — HYDROMORPHONE HCL PF 1 MG/ML IJ SOLN
2.0000 mg | Freq: Once | INTRAMUSCULAR | Status: AC
  Administered 2013-12-07: 2 mg via INTRAMUSCULAR
  Filled 2013-12-07: qty 2

## 2013-12-07 MED ORDER — KETOROLAC TROMETHAMINE 60 MG/2ML IM SOLN
60.0000 mg | Freq: Once | INTRAMUSCULAR | Status: AC
  Administered 2013-12-07: 60 mg via INTRAMUSCULAR
  Filled 2013-12-07: qty 2

## 2013-12-07 MED ORDER — HYDROMORPHONE HCL PF 1 MG/ML IJ SOLN
1.0000 mg | Freq: Once | INTRAMUSCULAR | Status: DC
  Filled 2013-12-07: qty 1

## 2013-12-07 NOTE — ED Notes (Signed)
Pt denies nausea at this time.

## 2013-12-07 NOTE — Telephone Encounter (Signed)
Patient called reporting increasing severe lower back pain since Monday.  No associated injury, trauma, fall.  No dysuria, fever, chills.  Patient was seen in the ED for this on 12/29 with unremarkable CT and UA.  She was treated with Toradol and Dilaudid with improvement in her pain.    Patient requesting pain medication.  Informed her that if her pain is severe and worsening that she should come in to be evaluated.  Patient agreed.

## 2013-12-07 NOTE — ED Notes (Signed)
Patient reports severe pain while walking, sitting and palpation to her left lower back and left side. Patient has no redness of skin at site, denies n/v, Denies burning on urination.

## 2013-12-07 NOTE — ED Notes (Signed)
Patient here ambulatory via [facility] with complaints of back pain. Was seen here on 12/29 for the same. Tonight, the patient reports that the pain is worse, rates 9/10.  VSS per EMS [SBP 156 (palpated), HR 80, RR 18]

## 2013-12-07 NOTE — ED Notes (Signed)
Pt given cup of water 

## 2013-12-07 NOTE — ED Provider Notes (Signed)
CSN: [id number]     Arrival date & time 12/07/13  0212 History   First MD Initiated Contact with Patient 12/07/13 0335     Chief Complaint  Patient presents with  . Back Pain    HPI Patient reports ongoing left flank pain with radiation around to her left anterior abdomen.  She states light touch is what bothers her skin the most.  Even her shirt touching her skin seems to bother her.  She has noted no rash.  She's seen in the emergency department on 1229 for similar symptoms at that time had labs, urine, CT scan without abnormalities.  Patient reports that her pain continues at this time.  She is on Vicodin at home for chronic knee pain.  She states this has not been helping with her symptoms.  She received Toradol and Dilaudid in the ER last time with significant improvement in her symptoms.  No fevers or chills.  No dysuria urinary frequency.  No nausea vomiting or diarrhea.  Her pain is moderate to severe in severity at this time   Past Medical History  Diagnosis Date  . Morbid obesity   . Alveolar hypoventilation   . Asthma   . GERD (gastroesophageal reflux disease)   . Rectal fissure   . Hypertension   . Costochondritis   . Cellulitis 08/2010-08/2011  . Depression   . Bipolar 2 disorder   . Obstructive sleep apnea   . Chest pain     NO CAD by cath [id number], nl LV systolic fxn  . Diabetes mellitus 2000    Type 2, Uncontrolled  . Arthritis   . HLD (hyperlipidemia)   . Anemia   . COPD (chronic obstructive pulmonary disease)    Past Surgical History  Procedure Laterality Date  . Carpal tunnel release    . Bil knee surgery    . Mass excision N/A 06/29/2013    Procedure:  WIDE LOCAL EXCISION OF POSTERIOR NECK ABSCESS;  Surgeon: Ralene Ok, MD;  Location: [facility];  Service: General;  Laterality: N/A;   Family History  Problem Relation Age of Onset  . Diabetes Mother   . Hyperlipidemia Mother   . Depression Mother   . Heart attack Paternal Uncle   . Heart disease Paternal  Grandmother   . Heart attack Paternal Grandmother   . Heart attack Paternal Grandfather   . Heart disease Paternal Grandfather    History  Substance Use Topics  . Smoking status: Former Smoker -- 0.30 packs/day for .3 years    Types: Cigarettes    Quit date: 12/06/1993  . Smokeless tobacco: Never Used  . Alcohol Use: No   OB History   Grav Para Term Preterm Abortions TAB SAB Ect Mult Living                 Review of Systems  All other systems reviewed and are negative.    Allergies  Kiwi extract and Nubain  Home Medications   Current Outpatient Rx  Name  Route  Sig  Dispense  Refill  . albuterol (PROVENTIL HFA;VENTOLIN HFA) 108 (90 BASE) MCG/ACT inhaler   Inhalation   Inhale 2 puffs into the lungs every 6 (six) hours as needed for wheezing or shortness of breath.         Marland Kitchen atorvastatin (LIPITOR) 20 MG tablet   Oral   Take 1 tablet (20 mg total) by mouth daily.   30 tablet   3   . budesonide-formoterol (SYMBICORT) 160-4.5 MCG/ACT inhaler  Inhalation   Inhale 2 puffs into the lungs 2 (two) times daily.         . carvedilol (COREG) 12.5 MG tablet   Oral   Take 12.5 mg by mouth 2 (two) times daily with a meal.         . cetirizine (ZYRTEC) 10 MG tablet   Oral   Take 1 tablet (10 mg total) by mouth daily as needed for allergies.   30 tablet   3   . cyclobenzaprine (FLEXERIL) 10 MG tablet      Take one tablet by mouth daily as needed for muscle spasm.   30 tablet   2   . exenatide (BYETTA) 5 MCG/0.02ML SOPN injection   Subcutaneous   Inject 5 mcg into the skin 2 (two) times daily with a meal.         . HYDROcodone-acetaminophen (NORCO) 10-325 MG per tablet   Oral   Take 1 tablet by mouth every 8 (eight) hours as needed.   90 tablet   0     Fill 30 days after last fill   . insulin aspart (NOVOLOG) 100 UNIT/ML injection   Subcutaneous   Inject 60 Units into the skin 3 (three) times daily with meals.          . Insulin Glargine (LANTUS  SOLOSTAR) 100 UNIT/ML SOPN   Subcutaneous   Inject 115 Units into the skin 2 (two) times daily.   5 pen   10   . lisinopril (PRINIVIL,ZESTRIL) 10 MG tablet   Oral   Take 10 mg by mouth daily.           . metFORMIN (GLUCOPHAGE) 500 MG tablet   Oral   Take 500 mg by mouth 2 (two) times daily with a meal.         . nitroGLYCERIN (NITROSTAT) 0.4 MG SL tablet   Sublingual   Place 1 tablet (0.4 mg total) under the tongue every 5 (five) minutes as needed for chest pain.   50 tablet   3   . nystatin (MYCOSTATIN) powder   Topical   Apply 1 Units topically daily. Applies under stomach         . oxyCODONE-acetaminophen (PERCOCET/ROXICET) 5-325 MG per tablet   Oral   Take 1-2 tablets by mouth every 6 (six) hours as needed for severe pain.   10 tablet   0   . pantoprazole (PROTONIX) 40 MG tablet   Oral   Take 40 mg by mouth daily.         . traMADol (ULTRAM) 50 MG tablet   Oral   Take 50 mg by mouth daily as needed for moderate pain.         Marland Kitchen zolpidem (AMBIEN) 5 MG tablet   Oral   Take 5 mg by mouth at bedtime as needed for sleep.         Marland Kitchen ACCU-CHEK FASTCLIX LANCETS MISC      USE AS DIRECTED FOUR TIMES A DAY   204 each   3   . gabapentin (NEURONTIN) 300 MG capsule   Oral   Take 1 capsule (300 mg total) by mouth 3 (three) times daily.   60 capsule   0   . glucose blood (ACCU-CHEK AVIVA) test strip      Check blood sugars  before and after meals and at bedtime.   200 each   2   . glucose blood (ACCU-CHEK SMARTVIEW) test strip  Use as instructed to test up to 5 times daily.   Dispense Qs for 5 times daily testing.   100 each   12   . glucose blood (ACCU-CHEK SMARTVIEW) test strip      Use as instructed for up to 5 times daily testing.  Disp QS for 5x daily testing   100 each   12   . Insulin Pen Needle (NOVOFINE) 30G X 8 MM MISC   Subcutaneous   Inject 10 each into the skin as needed. QS for 7 shots daily   1 packet   11    BP 152/90   Pulse 114  Temp(Src) 97.9 F (36.6 C) (Oral)  Resp 18  Ht 5\' 2"  (1.575 m)  Wt 324 lb 1 oz (146.994 kg)  BMI 59.26 kg/m2  SpO2 97%  LMP 10/17/2013 Physical Exam  Nursing note and vitals reviewed. Constitutional: She is oriented to person, place, and time. She appears well-developed and well-nourished. No distress.  HENT:  Head: Normocephalic and atraumatic.  Eyes: EOM are normal.  Neck: Normal range of motion.  Cardiovascular: Normal rate, regular rhythm and normal heart sounds.   Pulmonary/Chest: Effort normal and breath sounds normal.  Abdominal: Soft. She exhibits no distension. There is no tenderness.  Morbidly obese abdomen.  Left flank and left side of her abdomen with significant tenderness even to light touch overlying the skin.  No rash noted.  No erythema or warmth.  Hammond area under pannus and no signs of infection are present and no signs to suggest panniculitis  Musculoskeletal: Normal range of motion.  Neurological: She is alert and oriented to person, place, and time.  Skin: Skin is warm and dry.  Psychiatric: She has a normal mood and affect. Judgment normal.    ED Course  Procedures (including critical care time) Labs Review Labs Reviewed - No data to display Imaging Review No results found.  EKG Interpretation   None       MDM   1. Neuropathic pain    Her pain she is to be more neuropathic in nature.  She has no evidence of rash but otherwise this looks like this could turn into shingles.  Home on Neurontin.  Has a pain contract.  Pain improved in the ER.  No indication for additional testing.  Labs, urine, CT scan from several days ago reviewed    Hoy Morn, MD 12/07/13 [phone]

## 2013-12-08 DIAGNOSIS — G4733 Obstructive sleep apnea (adult) (pediatric): Secondary | ICD-10-CM

## 2013-12-08 NOTE — Sleep Study (Signed)
   NAME: [name] DATE OF BIRTH:  03-03-1976 MEDICAL RECORD NUMBER [id number]  LOCATION: [facility]  PHYSICIAN: YOUNG,CLINTON D  DATE OF STUDY: 12/07/2013  SLEEP STUDY TYPE: Nocturnal Polysomnogram               REFERRING PHYSICIAN: [name], MD  INDICATION FOR STUDY: Hypersomnia with sleep apnea. A baseline polysomnogram on 04/22/2010 recorded AHI of 46 per hour and titrated CPAP to 14 CWP. Body weight was 353 pounds. CPAP titration is now requested.  EPWORTH SLEEPINESS SCORE:   9/24th HEIGHT: 5\' 2"  (157.5 cm)  WEIGHT: 325 lb (147.419 kg)    Body mass index is 59.43 kg/(m^2).  NECK SIZE: 19 in.  MEDICATIONS: Charted for review  SLEEP ARCHITECTURE: Total sleep time 378.5 minutes with sleep efficiency 94.4%. Stage I was 2%, stage II 79.9%, stage III 0.8%, REM 17.3% of total sleep time. Sleep latency 12 minutes, REM latency 135 minutes, awake after sleep onset 10.5 minutes, arousal index 4.3. Bedtime medication: Tramadol, gabapentin  RESPIRATORY DATA: CPAP titration protocol. CPAP was titrated to 13 CWP, AHI 0 per hour. She wore a small ResMed AirFit F10 fullface mask with heated humidifier.  OXYGEN DATA: Snoring was prevented by CPAP and mean oxygen saturation of 93.3% on room air.  CARDIAC DATA: Sinus rhythm  MOVEMENT/PARASOMNIA: A total of 274 limb jerks were counted of which only 6 were associated with arousal or wakening for a periodic limb movement with arousal index of 1 per hour. Bathroom x1.  IMPRESSION/ RECOMMENDATION:   1) Successful CPAP titration to 13 CWP, AHI 0 per hour. She wore a small ResMed AirFit F10 fullface mask with heated humidifier. Snoring was prevented and mean oxygen saturation held 93.3% on room air.  2) baseline polysomnogram on 04/22/2010 recorded AHI 46 per hour with body weight 353 pounds.  Signed Baird Lyons M.D. Deneise Lever Diplomate, American Board of Sleep Medicine  ELECTRONICALLY SIGNED ON:   12/08/2013, 11:59 AM [place] PH: [phone]   FX: [phone] [place]

## 2013-12-10 ENCOUNTER — Telehealth: Payer: Self-pay | Admitting: Family Medicine

## 2013-12-10 NOTE — Telephone Encounter (Signed)
After hours call  Patient calling in states that she has gone to the ED twice in the last 2 weeks. She said that last time she went she was told she has symptoms of shingles and given gadopentetate. She's continued pain in her side in the area of concern is unchanged.  She is calling because she's had vomiting and by mouth intolerance today. She states that she has thrown up approximately 3 times including her food and fluid that she takes in. She denies any nausea and states that when she is without her she just begins to vomit. She states that she has not been able to tolerate any fluid at all today.  I discussed with her that if she is unable to keep any food or fluid down at all and she should be seen. I recommended taking small to medium drinks of fluid every 5 or 10 minutes for the next 2-3 hours. I advised her that if she can handle this she could stay out of the ER tonight and should call the clinic first thing in the morning for same-day appointment.  She expressed understanding and will try to gently rehydrate herself and avoid going to the ER.  Laroy Apple, MD [place] Resident, PGY-2 12/10/2013, 10:30 PM

## 2013-12-12 ENCOUNTER — Encounter: Payer: Self-pay | Admitting: Family Medicine

## 2013-12-12 ENCOUNTER — Ambulatory Visit (INDEPENDENT_AMBULATORY_CARE_PROVIDER_SITE_OTHER): Payer: Medicaid Other | Admitting: Family Medicine

## 2013-12-12 VITALS — BP 150/88 | HR 125 | Temp 97.5°F | Ht 62.0 in | Wt 322.9 lb

## 2013-12-12 DIAGNOSIS — B029 Zoster without complications: Secondary | ICD-10-CM

## 2013-12-12 MED ORDER — VALACYCLOVIR HCL 1 G PO TABS: 1000.0000 mg | ORAL_TABLET | Freq: Three times a day (TID) | ORAL | Status: DC

## 2013-12-12 MED ORDER — GABAPENTIN 300 MG PO CAPS: 600.0000 mg | ORAL_CAPSULE | Freq: Three times a day (TID) | ORAL | Status: DC

## 2013-12-12 NOTE — Patient Instructions (Addendum)
It was great to see you again today!  We are going to treat this like it's shingles. See the handout below. We are increasing gabapentin and I also am prescribing valtrex, an antiviral medicine.  Follow up in 3 weeks or sooner if anything changes.  Be well, Dr. [name]   Shingles Shingles (herpes zoster) is an infection that is caused by the same virus that causes chickenpox (varicella). The infection causes a painful skin rash and fluid-filled blisters, which eventually break open, crust over, and heal. It may occur in any area of the body, but it usually affects only one side of the body or face. The pain of shingles usually lasts about 1 month. However, some people with shingles may develop long-term (chronic) pain in the affected area of the body. Shingles often occurs many years after the person had chickenpox. It is more common:  In people older than 50 years.  In people with weakened immune systems, such as those with HIV, AIDS, or cancer.  In people taking medicines that weaken the immune system, such as transplant medicines.  In people under great stress. CAUSES  Shingles is caused by the varicella zoster virus (VZV), which also causes chickenpox. After a person is infected with the virus, it can remain in the person's body for years in an inactive state (dormant). To cause shingles, the virus reactivates and breaks out as an infection in a nerve root. The virus can be spread from person to person (contagious) through contact with open blisters of the shingles rash. It will only spread to people who have not had chickenpox. When these people are exposed to the virus, they may develop chickenpox. They will not develop shingles. Once the blisters scab over, the person is no longer contagious and cannot spread the virus to others. SYMPTOMS  Shingles shows up in stages. The initial symptoms may be pain, itching, and tingling in an area of the skin. This pain is usually described as  burning, stabbing, or throbbing.In a few days or weeks, a painful red rash will appear in the area where the pain, itching, and tingling were felt. The rash is usually on one side of the body in a band or belt-like pattern. Then, the rash usually turns into fluid-filled blisters. They will scab over and dry up in approximately 2 3 weeks. Flu-like symptoms may also occur with the initial symptoms, the rash, or the blisters. These may include:  Fever.  Chills.  Headache.  Upset stomach. DIAGNOSIS  Your caregiver will perform a skin exam to diagnose shingles. Skin scrapings or fluid samples may also be taken from the blisters. This sample will be examined under a microscope or sent to a lab for further testing. TREATMENT  There is no specific cure for shingles. Your caregiver will likely prescribe medicines to help you manage the pain, recover faster, and avoid long-term problems. This may include antiviral drugs, anti-inflammatory drugs, and pain medicines. HOME CARE INSTRUCTIONS   Take a cool bath or apply cool compresses to the area of the rash or blisters as directed. This may help with the pain and itching.   Only take over-the-counter or prescription medicines as directed by your caregiver.   Rest as directed by your caregiver.  Keep your rash and blisters clean with mild soap and cool water or as directed by your caregiver.  Do not pick your blisters or scratch your rash. Apply an anti-itch cream or numbing creams to the affected area as directed by your  caregiver.  Keep your shingles rash covered with a loose bandage (dressing).  Avoid skin contact with:  Babies.   Pregnant women.   Children with eczema.   Elderly people with transplants.   People with chronic illnesses, such as leukemia or AIDS.   Wear loose-fitting clothing to help ease the pain of material rubbing against the rash.  Keep all follow-up appointments with your caregiver.If the area involved is  on your face, you may receive a referral for follow-up to a specialist, such as an eye doctor (ophthalmologist) or an ear, nose, and throat (ENT) doctor. Keeping all follow-up appointments will help you avoid eye complications, chronic pain, or disability.  SEEK IMMEDIATE MEDICAL CARE IF:   You have facial pain, pain around the eye area, or loss of feeling on one side of your face.  You have ear pain or ringing in your ear.  You have loss of taste.  Your pain is not relieved with prescribed medicines.   Your redness or swelling spreads.   You have more pain and swelling.  Your condition is worsening or has changed.   You have a feveror persistent symptoms for more than 2 3 days.  You have a fever and your symptoms suddenly get worse. MAKE SURE YOU:  Understand these instructions.  Will watch your condition.  Will get help right away if you are not doing well or get worse. Document Released: 11/22/2005 Document Revised: 08/16/2012 Document Reviewed: 07/06/2012 [facility] Patient Information 2014 [place].

## 2013-12-12 NOTE — Progress Notes (Signed)
Patient ID: [name], female   DOB: 02-22-1976, 38 y.o.   MRN: [id number]  HPI:  L flank pain: pt was seen in ER twice for L flank pain, wonders if it is shingles. Her skin is very tender with touch. There is a burning and tingling sensation. It is now just slightly better, maybe 5-10% better. In ER she had a CT of her abdomen out of concern for a kidney stone but it showed no abnormalities, also had labwork and urine studies which were unremarkable. The ER gave her gabapentin but it has not helped. She is having trouble sleeping because it hurts when she lays on her right side (she doesn't know why it hurts to lay on the opposite side). The pain started last week abruptly. She has not noticed any rash or skin changes but thinks there might have been some noted when she went to the ER. She does think the area feels hot to touch. She vomited the other night a couple of times, has vomited a total of 4 times but has not vomited in the last 2 days. She is now eating and drinking okay. She has not had any new sexual partners and has not had any vaginal discharge. Has had regular bowel movements and urination. Denies dysuria or hematuria.  ROS: See HPI  [place]: hx uncontrolled T2DM  PHYSICAL EXAM: BP 150/88  Pulse 125  Temp(Src) 97.5 F (36.4 C) (Oral)  Ht 5\' 2"  (1.575 m)  Wt 322 lb 14.4 oz (146.466 kg)  BMI 59.04 kg/m2  LMP 10/17/2013 Gen: NAD HEENT: NCAT Lungs: NWOB Abdomen: soft, nontender to palpation, morbidly obese Back: TTP over left side of back wrapping around to front in dermatomal distribution. 2-3 scattered small bruises in this area, but no vesicles or other lesions. Neuro: grossly nonfocal, speech intact  ASSESSMENT/PLAN:  # Flank pain: dermatomal distrubution suggestive of possible shingles, although pt has not had any dermatologic manifestations.  - increase gabapentin to 600mg  TID - rx valtrex 1000mg  TID x 7 days to treat for acute herpes zoster - Precepted with Dr.  Lindell Noe who also examined patient and agrees with this plan.   FOLLOW UP: F/u in 3 weeks for f/u of flank pain.  [place]. [name], [place]

## 2013-12-13 ENCOUNTER — Telehealth: Payer: Self-pay | Admitting: *Deleted

## 2013-12-13 DIAGNOSIS — E1165 Type 2 diabetes mellitus with hyperglycemia: Secondary | ICD-10-CM

## 2013-12-13 DIAGNOSIS — IMO0002 Reserved for concepts with insufficient information to code with codable children: Secondary | ICD-10-CM

## 2013-12-13 NOTE — Telephone Encounter (Signed)
LMOVM for pt to return call.  Please have pt make a fasting lab appt to check cholesterol.  Future orders in. Rito Lecomte, Salome Spotted

## 2013-12-20 ENCOUNTER — Other Ambulatory Visit: Payer: Self-pay | Admitting: Family Medicine

## 2013-12-20 DIAGNOSIS — J449 Chronic obstructive pulmonary disease, unspecified: Secondary | ICD-10-CM | POA: Insufficient documentation

## 2013-12-20 DIAGNOSIS — Z79899 Other long term (current) drug therapy: Secondary | ICD-10-CM | POA: Insufficient documentation

## 2013-12-20 DIAGNOSIS — Z87891 Personal history of nicotine dependence: Secondary | ICD-10-CM | POA: Insufficient documentation

## 2013-12-20 DIAGNOSIS — E1165 Type 2 diabetes mellitus with hyperglycemia: Secondary | ICD-10-CM

## 2013-12-20 DIAGNOSIS — K219 Gastro-esophageal reflux disease without esophagitis: Secondary | ICD-10-CM | POA: Insufficient documentation

## 2013-12-20 DIAGNOSIS — E785 Hyperlipidemia, unspecified: Secondary | ICD-10-CM | POA: Insufficient documentation

## 2013-12-20 DIAGNOSIS — Z8739 Personal history of other diseases of the musculoskeletal system and connective tissue: Secondary | ICD-10-CM | POA: Insufficient documentation

## 2013-12-20 DIAGNOSIS — I10 Essential (primary) hypertension: Secondary | ICD-10-CM | POA: Insufficient documentation

## 2013-12-20 DIAGNOSIS — M129 Arthropathy, unspecified: Secondary | ICD-10-CM | POA: Insufficient documentation

## 2013-12-20 DIAGNOSIS — G5790 Unspecified mononeuropathy of unspecified lower limb: Secondary | ICD-10-CM | POA: Insufficient documentation

## 2013-12-20 DIAGNOSIS — Z862 Personal history of diseases of the blood and blood-forming organs and certain disorders involving the immune mechanism: Secondary | ICD-10-CM | POA: Insufficient documentation

## 2013-12-20 DIAGNOSIS — F319 Bipolar disorder, unspecified: Secondary | ICD-10-CM | POA: Insufficient documentation

## 2013-12-20 DIAGNOSIS — IMO0001 Reserved for inherently not codable concepts without codable children: Secondary | ICD-10-CM | POA: Insufficient documentation

## 2013-12-20 DIAGNOSIS — Z794 Long term (current) use of insulin: Secondary | ICD-10-CM | POA: Insufficient documentation

## 2013-12-20 DIAGNOSIS — Z872 Personal history of diseases of the skin and subcutaneous tissue: Secondary | ICD-10-CM | POA: Insufficient documentation

## 2013-12-20 DIAGNOSIS — J4489 Other specified chronic obstructive pulmonary disease: Secondary | ICD-10-CM | POA: Insufficient documentation

## 2013-12-20 NOTE — ED Notes (Signed)
Pt states left sided back and flank pain. Denies trauma. States she was seen 3 weeks ago and diagnosed with shingles. States pain is worsening.

## 2013-12-21 ENCOUNTER — Encounter: Payer: Self-pay | Admitting: Family Medicine

## 2013-12-21 ENCOUNTER — Ambulatory Visit (INDEPENDENT_AMBULATORY_CARE_PROVIDER_SITE_OTHER): Payer: Medicaid Other | Admitting: Family Medicine

## 2013-12-21 ENCOUNTER — Emergency Department (HOSPITAL_COMMUNITY)
Admission: EM | Admit: 2013-12-21 | Discharge: 2013-12-21 | Disposition: A | Discharge status: Home / Self Care | Payer: Medicaid Other | Attending: Emergency Medicine | Admitting: Emergency Medicine

## 2013-12-21 VITALS — BP 137/88 | HR 106 | Temp 98.2°F | Ht 62.0 in | Wt 320.0 lb

## 2013-12-21 DIAGNOSIS — M792 Neuralgia and neuritis, unspecified: Secondary | ICD-10-CM

## 2013-12-21 DIAGNOSIS — M549 Dorsalgia, unspecified: Secondary | ICD-10-CM

## 2013-12-21 MED ORDER — GABAPENTIN 600 MG PO TABS: 600.0000 mg | ORAL_TABLET | Freq: Three times a day (TID) | ORAL | Status: DC

## 2013-12-21 MED ORDER — ONDANSETRON 4 MG PO TBDP
8.0000 mg | ORAL_TABLET | Freq: Once | ORAL | Status: AC
  Administered 2013-12-21: 8 mg via ORAL
  Filled 2013-12-21: qty 2

## 2013-12-21 MED ORDER — GABAPENTIN 800 MG PO TABS: 800.0000 mg | ORAL_TABLET | Freq: Three times a day (TID) | ORAL | Status: DC

## 2013-12-21 MED ORDER — GABAPENTIN 300 MG PO CAPS
600.0000 mg | ORAL_CAPSULE | Freq: Three times a day (TID) | ORAL | Status: DC
Start: 2013-12-21 — End: 2013-12-21
  Administered 2013-12-21: 600 mg via ORAL
  Filled 2013-12-21: qty 2

## 2013-12-21 MED ORDER — HYDROMORPHONE HCL PF 1 MG/ML IJ SOLN
1.0000 mg | Freq: Once | INTRAMUSCULAR | Status: AC
  Administered 2013-12-21: 1 mg via INTRAMUSCULAR
  Filled 2013-12-21: qty 1

## 2013-12-21 NOTE — ED Provider Notes (Signed)
CSN: [id number]     Arrival date & time 12/20/13  2017 History   First MD Initiated Contact with Patient 12/21/13 0236     Chief Complaint  Patient presents with  . Back Pain   (Consider location/radiation/quality/duration/timing/severity/associated sxs/prior Treatment) HPI 38 year old female presents to emergency room from home with complaint of persistent left flank pain.  Patient was seen in emergency department on the second, also seen by her primary care Dr. on January 7.  Initial thought was possible early shingles.  She was placed on Neurontin, from the emergency department, and Valtrex by her primary care Dr.  Patient, reports she's run out of both the Neurontin, and the Valtrex.  She reports that these medications did help some, but there were some residual pain.  No trauma to the area.  Patient seen in the emergency department at the end of December with similar pain as well, at that time.  She had urine with slight amount of blood, and negative CT scan for stone.  Patient reports the pain is located in the skin.  Touching the area lightly or having) across it is extremely painful.  The pain is sharp, burning, and stabbing.  She denies any rash Past Medical History  Diagnosis Date  . Morbid obesity   . Alveolar hypoventilation   . Asthma   . GERD (gastroesophageal reflux disease)   . Rectal fissure   . Hypertension   . Costochondritis   . Cellulitis 08/2010-08/2011  . Depression   . Bipolar 2 disorder   . Obstructive sleep apnea   . Chest pain     NO CAD by cath [id number], nl LV systolic fxn  . Diabetes mellitus 2000    Type 2, Uncontrolled  . Arthritis   . HLD (hyperlipidemia)   . Anemia   . COPD (chronic obstructive pulmonary disease)    Past Surgical History  Procedure Laterality Date  . Carpal tunnel release    . Bil knee surgery    . Mass excision N/A 06/29/2013    Procedure:  WIDE LOCAL EXCISION OF POSTERIOR NECK ABSCESS;  Surgeon: Ralene Ok, MD;  Location: [facility];  Service: General;  Laterality: N/A;   Family History  Problem Relation Age of Onset  . Diabetes Mother   . Hyperlipidemia Mother   . Depression Mother   . Heart attack Paternal Uncle   . Heart disease Paternal Grandmother   . Heart attack Paternal Grandmother   . Heart attack Paternal Grandfather   . Heart disease Paternal Grandfather    History  Substance Use Topics  . Smoking status: Former Smoker -- 0.30 packs/day for .3 years    Types: Cigarettes    Quit date: 12/06/1993  . Smokeless tobacco: Never Used  . Alcohol Use: No   OB History   Grav Para Term Preterm Abortions TAB SAB Ect Mult Living                 Review of Systems  See History of Present Illness; otherwise all other systems are reviewed and negative Allergies  Kiwi extract and Nubain  Home Medications   Current Outpatient Rx  Name  Route  Sig  Dispense  Refill  . albuterol (PROVENTIL HFA;VENTOLIN HFA) 108 (90 BASE) MCG/ACT inhaler   Inhalation   Inhale 2 puffs into the lungs every 6 (six) hours as needed for wheezing or shortness of breath.         Marland Kitchen atorvastatin (LIPITOR) 20 MG tablet   Oral  Take 20 mg by mouth daily.         . budesonide-formoterol (SYMBICORT) 160-4.5 MCG/ACT inhaler   Inhalation   Inhale 2 puffs into the lungs 2 (two) times daily.         . carvedilol (COREG) 12.5 MG tablet   Oral   Take 12.5 mg by mouth 2 (two) times daily with a meal.         . cetirizine (ZYRTEC) 10 MG tablet   Oral   Take 10 mg by mouth daily.         . cyclobenzaprine (FLEXERIL) 10 MG tablet      Take one tablet by mouth daily as needed for muscle spasm.   30 tablet   2   . exenatide (BYETTA) 5 MCG/0.02ML SOPN injection   Subcutaneous   Inject 5 mcg into the skin 2 (two) times daily with a meal.         . HYDROcodone-acetaminophen (NORCO) 10-325 MG per tablet   Oral   Take 1 tablet by mouth every 8 (eight) hours as needed.   90 tablet   0     Fill 30 days after last  fill   . insulin aspart (NOVOLOG) 100 UNIT/ML injection   Subcutaneous   Inject 60 Units into the skin 3 (three) times daily with meals.          . Insulin Glargine (LANTUS SOLOSTAR) 100 UNIT/ML SOPN   Subcutaneous   Inject 115 Units into the skin 2 (two) times daily.   5 pen   10   . lisinopril (PRINIVIL,ZESTRIL) 10 MG tablet   Oral   Take 10 mg by mouth daily.           . metFORMIN (GLUCOPHAGE) 500 MG tablet   Oral   Take 500 mg by mouth 2 (two) times daily with a meal.         . nystatin (MYCOSTATIN) powder   Topical   Apply 1 Units topically daily. Applies under stomach         . pantoprazole (PROTONIX) 40 MG tablet   Oral   Take 40 mg by mouth daily.         . traMADol (ULTRAM) 50 MG tablet   Oral   Take 50 mg by mouth daily as needed for moderate pain.         Marland Kitchen zolpidem (AMBIEN) 5 MG tablet   Oral   Take 5 mg by mouth at bedtime as needed for sleep.         Marland Kitchen gabapentin (NEURONTIN) 600 MG tablet   Oral   Take 1 tablet (600 mg total) by mouth 3 (three) times daily.   60 tablet   0   . nitroGLYCERIN (NITROSTAT) 0.4 MG SL tablet   Sublingual   Place 1 tablet (0.4 mg total) under the tongue every 5 (five) minutes as needed for chest pain.   50 tablet   3    BP 154/70  Pulse 89  Temp(Src) 97.3 F (36.3 C) (Oral)  Resp 22  Ht 5\' 2"  (1.575 m)  Wt 320 lb 12.8 oz (145.514 kg)  BMI 58.66 kg/m2  SpO2 99% Physical Exam  Nursing note and vitals reviewed. Constitutional: She is oriented to person, place, and time. She appears well-developed and well-nourished. She appears distressed.  Morbidly obese, female, tearful, and uncomfortable appearing  HENT:  Head: Normocephalic and atraumatic.  Nose: Nose normal.  Mouth/Throat: Oropharynx is clear and moist.  Eyes:  Conjunctivae and EOM are normal. Pupils are equal, round, and reactive to light.  Neck: Normal range of motion. Neck supple. No JVD present. No tracheal deviation present. No thyromegaly  present.  Cardiovascular: Normal rate, regular rhythm, normal heart sounds and intact distal pulses.  Exam reveals no gallop and no friction rub.   No murmur heard. Pulmonary/Chest: Effort normal and breath sounds normal. No stridor. No respiratory distress. She has no wheezes. She has no rales. She exhibits no tenderness.  Abdominal: Soft. Bowel sounds are normal. She exhibits no distension and no mass. There is no tenderness. There is no rebound and no guarding.  Musculoskeletal: Normal range of motion. She exhibits tenderness (tenderness to left flank to light touch.  There is no overlying skin changes, induration, warmth, erythema). She exhibits no edema.  Lymphadenopathy:    She has no cervical adenopathy.  Neurological: She is alert and oriented to person, place, and time. She exhibits normal muscle tone. Coordination normal.  Skin: Skin is warm and dry. No rash noted. No erythema. No pallor.  Psychiatric: She has a normal mood and affect. Her behavior is normal. Judgment and thought content normal.    ED Course  Procedures (including critical care time) Labs Review Labs Reviewed - No data to display Imaging Review No results found.  EKG Interpretation   None       MDM   1. Neuropathic pain of flank    38 year old female with persistent left flank.  Pain.  Pain appears to be neuropathic.  We'll refill her Neurontin, and refer her back to her primary care Dr.    Kalman Drape, MD 12/21/13 0730

## 2013-12-21 NOTE — ED Notes (Signed)
Pt presents with left sided pain that starts in her left back and radiates to lower lower abdomen. States this is the same pain she was seen here early this month. Pt was started on Nuerotin and "100mg  blue pill" that relieved pain, but states she has been out for appx 2 days and pain has returned. Skin assessed; no rash, redness or signs of infection noted. Pt states pain is worse with palpation.

## 2013-12-21 NOTE — Patient Instructions (Signed)
It was great to see you again today!  I'm sorry your back is still bothering you so much. I sent in a prescription for an increased dose of gabapentin for you. Please use caution as it may make you sleepy, so do not drive when you are taking it.  We are checking an xray of your back. I will call you if your test results are not normal.  Otherwise, I will send you a letter.  If you do not hear from me with in 2 weeks please call our office.      Please follow up with me in the clinic in 1-2 weeks.  Be well, Dr. [name]

## 2013-12-21 NOTE — Discharge Instructions (Signed)
Please contact the family practice office later this morning.  You'll need close follow for further workup of this ongoing pain.  Take medications as prescribed.   Neuropathic Pain We often think that pain has a physical cause. If we get rid of the cause, the pain should go away. Nerves themselves can also cause pain. It is called neuropathic pain, which means nerve abnormality. It may be difficult for the patients who have it and for the treating caregivers. Pain is usually described as acute (short-lived) or chronic (long-lasting). Acute pain is related to the physical sensations caused by an injury. It can last from a few seconds to many weeks, but it usually goes away when normal healing occurs. Chronic pain lasts beyond the typical healing time. With neuropathic pain, the nerve fibers themselves may be damaged or injured. They then send incorrect signals to other pain centers. The pain you feel is real, but the cause is not easy to find.  CAUSES  Chronic pain can result from diseases, such as diabetes and shingles (an infection related to chickenpox), or from trauma, surgery, or amputation. It can also happen without any known injury or disease. The nerves are sending pain messages, even though there is no identifiable cause for such messages.   Other common causes of neuropathy include diabetes, phantom limb pain, or Regional Pain Syndrome (RPS).  As with all forms of chronic back pain, if neuropathy is not correctly treated, there can be a number of associated problems that lead to a downward cycle for the patient. These include depression, sleeplessness, feelings of fear and anxiety, limited social interaction and inability to do normal daily activities or work.  The most dramatic and mysterious example of neuropathic pain is called "phantom limb syndrome." This occurs when an arm or a leg has been removed because of illness or injury. The brain still gets pain messages from the nerves that  originally carried impulses from the missing limb. These nerves now seem to misfire and cause troubling pain.  Neuropathic pain often seems to have no cause. It responds poorly to standard pain treatment. Neuropathic pain can occur after:  Shingles (herpes zoster virus infection).  A lasting burning sensation of the skin, caused usually by injury to a peripheral nerve.  Peripheral neuropathy which is widespread nerve damage, often caused by diabetes or alcoholism.  Phantom limb pain following an amputation.  Facial nerve problems (trigeminal neuralgia).  Multiple sclerosis.  Reflex sympathetic dystrophy.  Pain which comes with cancer and cancer chemotherapy.  Entrapment neuropathy such as when pressure is put on a nerve such as in carpal tunnel syndrome.  Back, leg, and hip problems (sciatica).  Spine or back surgery.  HIV Infection or AIDS where nerves are infected by viruses. Your caregiver can explain items in the above list which may apply to you. SYMPTOMS  Characteristics of neuropathic pain are:  Severe, sharp, electric shock-like, shooting, lightening-like, knife-like.  Pins and needles sensation.  Deep burning, deep cold, or deep ache.  Persistent numbness, tingling, or weakness.  Pain resulting from light touch or other stimulus that would not usually cause pain.  Increased sensitivity to something that would normally cause pain, such as a pinprick. Pain may persist for months or years following the healing of damaged tissues. When this happens, pain signals no longer sound an alarm about current injuries or injuries about to happen. Instead, the alarm system itself is not working correctly.  Neuropathic pain may get worse instead of better over time.  For some people, it can lead to serious disability. It is important to be aware that severe injury in a limb can occur without a proper, protective pain response.Burns, cuts, and other injuries may go unnoticed.  Without proper treatment, these injuries can become infected or lead to further disability. Take any injury seriously, and consult your caregiver for treatment. DIAGNOSIS  When you have a pain with no known cause, your caregiver will probably ask some specific questions:   Do you have any other conditions, such as diabetes, shingles, multiple sclerosis, or HIV infection?  How would you describe your pain? (Neuropathic pain is often described as shooting, stabbing, burning, or searing.)  Is your pain worse at any time of the day? (Neuropathic pain is usually worse at night.)  Does the pain seem to follow a certain physical pathway?  Does the pain come from an area that has missing or injured nerves? (An example would be phantom limb pain.)  Is the pain triggered by minor things such as rubbing against the sheets at night? These questions often help define the type of pain involved. Once your caregiver knows what is happening, treatment can begin. Anticonvulsant, antidepressant drugs, and various pain relievers seem to work in some cases. If another condition, such as diabetes is involved, better management of that disorder may relieve the neuropathic pain.  TREATMENT  Neuropathic pain is frequently long-lasting and tends not to respond to treatment with narcotic type pain medication. It may respond well to other drugs such as antiseizure and antidepressant medications. Usually, neuropathic problems do not completely go away, but partial improvement is often possible with proper treatment. Your caregivers have large numbers of medications available to treat you. Do not be discouraged if you do not get immediate relief. Sometimes different medications or a combination of medications will be tried before you receive the results you are hoping for. See your caregiver if you have pain that seems to be coming from nowhere and does not go away. Help is available.  SEEK IMMEDIATE MEDICAL CARE IF:   There  is a sudden change in the quality of your pain, especially if the change is on only one side of the body.  You notice changes of the skin, such as redness, black or purple discoloration, swelling, or an ulcer.  You cannot move the affected limbs. Document Released: 08/19/2004 Document Revised: 02/14/2012 Document Reviewed: 08/19/2004 [facility] Patient Information 2014 [place].

## 2013-12-26 NOTE — Progress Notes (Signed)
Patient ID: [name], female   DOB: 02-28-1976, 38 y.o.   MRN: [id number]  HPI:  F/u flank pain: pt went to ER again overnight for continued persistent pain. She took the valtrex and gabapentin, which helped the pain some but she is now out of both. The pain has persisted. She continues to not have dysuria, fever, or leg weakness. The ER refilled her gabapentin this morning. She has not had any imaging of her spine other than a noncontrast CT scan when this all started about two weeks ago. She takes norco for chronic pain and states norco doesn't even touch this pain.  ROS: See HPI  [place]: hx uncontrolled DM.  PHYSICAL EXAM: BP 137/88  Pulse 106  Temp(Src) 98.2 F (36.8 C) (Oral)  Ht 5\' 2"  (1.575 m)  Wt 320 lb (145.151 kg)  BMI 58.51 kg/m2  LMP 11/06/2013 Gen: NAD, pleasant, cooperative HEENT: NCAT Heart: RRR Lungs: CTAB Abdomen: obese. TTP over left flank on skin in dermatomal distribution but NO rash visible Back: TTP over thoracic spinous processes. Full ROM of back.  Neuro: grossly nonfocal, speech intact  ASSESSMENT/PLAN:  # Flank pain: still seems neuropathic. etiology remains unclear but does not seem to be varicella zoster given that she still has no skin manifestation and it has not improved with valtrex. Will increase gabapentin to max dosing and check xray of thoracic spine to evaluate for possible nerve root impingement/disc herniation, which could be causing her symptoms. Precepted with Dr. Mingo Amber who also examined patient and agrees with this plan.   FOLLOW UP: F/u in 1-2 weeks for flank pain.  [place]. [name], [place]

## 2014-01-07 ENCOUNTER — Encounter: Payer: Self-pay | Admitting: Family Medicine

## 2014-01-07 ENCOUNTER — Emergency Department (HOSPITAL_COMMUNITY): Payer: Medicaid Other

## 2014-01-07 ENCOUNTER — Emergency Department (HOSPITAL_COMMUNITY)
Admission: EM | Admit: 2014-01-07 | Discharge: 2014-01-07 | Disposition: A | Discharge status: Home / Self Care | Payer: Medicaid Other | Attending: Emergency Medicine | Admitting: Emergency Medicine

## 2014-01-07 ENCOUNTER — Encounter (HOSPITAL_COMMUNITY): Payer: Self-pay | Admitting: Emergency Medicine

## 2014-01-07 ENCOUNTER — Ambulatory Visit (INDEPENDENT_AMBULATORY_CARE_PROVIDER_SITE_OTHER): Payer: Medicaid Other | Admitting: Family Medicine

## 2014-01-07 VITALS — BP 127/80 | HR 108 | Temp 98.2°F | Ht 62.0 in | Wt 325.7 lb

## 2014-01-07 DIAGNOSIS — G4733 Obstructive sleep apnea (adult) (pediatric): Secondary | ICD-10-CM | POA: Insufficient documentation

## 2014-01-07 DIAGNOSIS — J4489 Other specified chronic obstructive pulmonary disease: Secondary | ICD-10-CM | POA: Insufficient documentation

## 2014-01-07 DIAGNOSIS — M129 Arthropathy, unspecified: Secondary | ICD-10-CM | POA: Insufficient documentation

## 2014-01-07 DIAGNOSIS — M25529 Pain in unspecified elbow: Secondary | ICD-10-CM | POA: Insufficient documentation

## 2014-01-07 DIAGNOSIS — E785 Hyperlipidemia, unspecified: Secondary | ICD-10-CM | POA: Insufficient documentation

## 2014-01-07 DIAGNOSIS — Z862 Personal history of diseases of the blood and blood-forming organs and certain disorders involving the immune mechanism: Secondary | ICD-10-CM | POA: Insufficient documentation

## 2014-01-07 DIAGNOSIS — F3289 Other specified depressive episodes: Secondary | ICD-10-CM | POA: Insufficient documentation

## 2014-01-07 DIAGNOSIS — Z79899 Other long term (current) drug therapy: Secondary | ICD-10-CM | POA: Insufficient documentation

## 2014-01-07 DIAGNOSIS — Z872 Personal history of diseases of the skin and subcutaneous tissue: Secondary | ICD-10-CM | POA: Insufficient documentation

## 2014-01-07 DIAGNOSIS — M25521 Pain in right elbow: Secondary | ICD-10-CM | POA: Insufficient documentation

## 2014-01-07 DIAGNOSIS — I10 Essential (primary) hypertension: Secondary | ICD-10-CM | POA: Insufficient documentation

## 2014-01-07 DIAGNOSIS — J449 Chronic obstructive pulmonary disease, unspecified: Secondary | ICD-10-CM | POA: Insufficient documentation

## 2014-01-07 DIAGNOSIS — Z87891 Personal history of nicotine dependence: Secondary | ICD-10-CM | POA: Insufficient documentation

## 2014-01-07 DIAGNOSIS — F329 Major depressive disorder, single episode, unspecified: Secondary | ICD-10-CM | POA: Insufficient documentation

## 2014-01-07 DIAGNOSIS — Z794 Long term (current) use of insulin: Secondary | ICD-10-CM | POA: Insufficient documentation

## 2014-01-07 DIAGNOSIS — E119 Type 2 diabetes mellitus without complications: Secondary | ICD-10-CM | POA: Insufficient documentation

## 2014-01-07 DIAGNOSIS — K219 Gastro-esophageal reflux disease without esophagitis: Secondary | ICD-10-CM | POA: Insufficient documentation

## 2014-01-07 NOTE — ED Provider Notes (Signed)
Medical screening examination/treatment/procedure(s) were performed by non-physician practitioner and as supervising physician I was immediately available for consultation/collaboration.     Veryl Speak, MD 01/07/14 1534

## 2014-01-07 NOTE — ED Provider Notes (Signed)
CSN: [id number]     Arrival date & time 01/07/14  0847 History   First MD Initiated Contact with Patient 01/07/14 [phone]     No chief complaint on file.  (Consider location/radiation/quality/duration/timing/severity/associated sxs/prior Treatment) HPI  Patient to the ER with complaints of right elbow pain. She says that it started two days ago around 11 and she was not doing anything at the time. She denies injury or repetitive motions with arm. She denies a hx of pains in the arm. She reports it hurts most to the anterior portion of her elbow. It hurts with ROM and touch. She denies having fevers or redness, change in skin color or warmth to the area.   PMH + for morbid obesity, asthma, GERD, hypertension, cellulitis, depression, arthritis, COPD, DM, bipolar Type 2  Past Medical History  Diagnosis Date  . Morbid obesity   . Alveolar hypoventilation   . Asthma   . GERD (gastroesophageal reflux disease)   . Rectal fissure   . Hypertension   . Costochondritis   . Cellulitis 08/2010-08/2011  . Depression   . Bipolar 2 disorder   . Obstructive sleep apnea   . Chest pain     NO CAD by cath 9/37/16, nl LV systolic fxn  . Diabetes mellitus 2000    Type 2, Uncontrolled  . Arthritis   . HLD (hyperlipidemia)   . Anemia   . COPD (chronic obstructive pulmonary disease)    Past Surgical History  Procedure Laterality Date  . Carpal tunnel release    . Bil knee surgery    . Mass excision N/A 06/29/2013    Procedure:  WIDE LOCAL EXCISION OF POSTERIOR NECK ABSCESS;  Surgeon: Ralene Ok, MD;  Location: [facility];  Service: General;  Laterality: N/A;   Family History  Problem Relation Age of Onset  . Diabetes Mother   . Hyperlipidemia Mother   . Depression Mother   . Heart attack Paternal Uncle   . Heart disease Paternal Grandmother   . Heart attack Paternal Grandmother   . Heart attack Paternal Grandfather   . Heart disease Paternal Grandfather    History  Substance Use Topics  .  Smoking status: Former Smoker -- 0.30 packs/day for .3 years    Types: Cigarettes    Quit date: 12/06/1993  . Smokeless tobacco: Never Used  . Alcohol Use: No   OB History   Grav Para Term Preterm Abortions TAB SAB Ect Mult Living                 Review of Systems The patient denies anorexia, fever, weight loss,, vision loss, decreased hearing, hoarseness, chest pain, syncope, dyspnea on exertion, peripheral edema, balance deficits, hemoptysis, abdominal pain, melena, hematochezia, severe indigestion/heartburn, hematuria, incontinence, genital sores, muscle weakness, suspicious skin lesions, transient blindness, difficulty walking, depression, unusual weight change, abnormal bleeding, enlarged lymph nodes, angioedema, and breast masses.  Allergies  Kiwi extract and Nubain  Home Medications   Current Outpatient Rx  Name  Route  Sig  Dispense  Refill  . albuterol (PROVENTIL HFA;VENTOLIN HFA) 108 (90 BASE) MCG/ACT inhaler   Inhalation   Inhale 2 puffs into the lungs every 6 (six) hours as needed for wheezing or shortness of breath.         Marland Kitchen atorvastatin (LIPITOR) 20 MG tablet   Oral   Take 20 mg by mouth daily.         . budesonide-formoterol (SYMBICORT) 160-4.5 MCG/ACT inhaler   Inhalation   Inhale 2  puffs into the lungs 2 (two) times daily.         . carvedilol (COREG) 12.5 MG tablet   Oral   Take 12.5 mg by mouth 2 (two) times daily with a meal.         . cetirizine (ZYRTEC) 10 MG tablet   Oral   Take 10 mg by mouth daily.         . cyclobenzaprine (FLEXERIL) 10 MG tablet   Oral   Take 10 mg by mouth 3 (three) times daily as needed for muscle spasms.         Marland Kitchen exenatide (BYETTA) 5 MCG/0.02ML SOPN injection   Subcutaneous   Inject 5 mcg into the skin 2 (two) times daily with a meal.         . gabapentin (NEURONTIN) 800 MG tablet   Oral   Take 800 mg by mouth 3 (three) times daily.         Marland Kitchen HYDROcodone-acetaminophen (NORCO) 10-325 MG per tablet    Oral   Take 1 tablet by mouth every 8 (eight) hours as needed.   90 tablet   0     Fill 30 days after last fill   . insulin aspart (NOVOLOG) 100 UNIT/ML injection   Subcutaneous   Inject 60 Units into the skin 3 (three) times daily with meals.          . Insulin Glargine (LANTUS SOLOSTAR) 100 UNIT/ML SOPN   Subcutaneous   Inject 115 Units into the skin 2 (two) times daily.   5 pen   10   . lisinopril (PRINIVIL,ZESTRIL) 10 MG tablet   Oral   Take 10 mg by mouth daily.           . metFORMIN (GLUCOPHAGE) 500 MG tablet   Oral   Take 500 mg by mouth 2 (two) times daily with a meal.         . nitroGLYCERIN (NITROSTAT) 0.4 MG SL tablet   Sublingual   Place 1 tablet (0.4 mg total) under the tongue every 5 (five) minutes as needed for chest pain.   50 tablet   3   . nystatin (MYCOSTATIN) powder   Topical   Apply 1 Units topically daily. Applies under stomach         . pantoprazole (PROTONIX) 40 MG tablet   Oral   Take 40 mg by mouth daily.         . traMADol (ULTRAM) 50 MG tablet   Oral   Take 50 mg by mouth daily as needed for moderate pain.         Marland Kitchen zolpidem (AMBIEN) 5 MG tablet   Oral   Take 5 mg by mouth at bedtime.           BP 169/106  Pulse 114  SpO2 98%  LMP 12/31/2013 Physical Exam  Nursing note and vitals reviewed. Constitutional: She appears well-developed and well-nourished. No distress.  HENT:  Head: Normocephalic and atraumatic.  Eyes: Pupils are equal, round, and reactive to light.  Neck: Normal range of motion. Neck supple.  Cardiovascular: Normal rate and regular rhythm.   Pulmonary/Chest: Effort normal.  Abdominal: Soft.  Musculoskeletal:       Left elbow: She exhibits decreased range of motion (due to pain). She exhibits no swelling, no effusion, no deformity and no laceration. Tenderness found. Lateral epicondyle tenderness noted.  Antecubital fossa tenderness No bruising, rashes, ecchymosis deformities noted. Radial pulses  are symmetrical  Physiologic grip  strength  Neurological: She is alert.  Skin: Skin is warm and dry.    ED Course  Procedures (including critical care time) Labs Review Labs Reviewed - No data to display Imaging Review Dg Elbow Complete Right  01/07/2014   CLINICAL DATA:  Pain  EXAM: RIGHT ELBOW - COMPLETE 3+ VIEW  COMPARISON:  Right forearm October 04, 2007  FINDINGS: Frontal, lateral, and bilateral oblique views were obtained. A linear sclerotic area in the proximal right radial metaphysis is stable compared to the prior study and probably represents prominence of the prior growth plate in this area. There is no acute fracture or dislocation. No effusion. Joint spaces appear intact. There is a minimal spur off the coracoid process of the proximal ulna.  IMPRESSION: No fracture or joint effusion. Minimal spur arising from the coracoid process of the proximal ulna.   Electronically Signed   By: Lowella Grip M.D.   On: 01/07/2014 09:38    EKG Interpretation   None       MDM   1. Elbow pain, right    Patient has pain contract Xray shows bone spurring, no other abnormality. Physical exam and xray not consistent with infection, dislocation or boney injury.  Will give foam arm sling and referral to Orthopedist.  38 y.o.Rebbeca Paul [name]'s evaluation in the Emergency Department is complete. It has been determined that no acute conditions requiring further emergency intervention are present at this time. The patient/guardian have been advised of the diagnosis and plan. We have discussed signs and symptoms that warrant return to the ED, such as changes or worsening in symptoms.  Vital signs are stable at discharge. Filed Vitals:   01/07/14 0858  BP: 169/106  Pulse:     Patient/guardian has voiced understanding and agreed to follow-up with the PCP or specialist.    Linus Mako, PA-C 01/07/14 [id number]

## 2014-01-07 NOTE — Patient Instructions (Signed)
Right Elbow Pain - medial epilcondilytis, ice 2-3 times daily for 20 minutes at a time, keep elevated when possible, bend the arm at least 1-2 times per hour, may take Ibuprofen 200-400 mg every 6 hours as needed for pain, may use compression with ACE bandage throughout the day   Medial Epicondylitis (Golfer's Elbow) with Rehab Medial epicondylitis involves inflammation and pain around the inner (medial) portion of the elbow. This pain is caused by inflammation of the tendons in the forearm that flex (bring down) the wrist. Medial epicondylitis is also called golfer's elbow, because it is common among golfers. However, it may occur in any individual who flexes the wrist regularly. If medial epicondylitis is left untreated, it may become a chronic problem. SYMPTOMS   Pain, tenderness, or inflammation over the inner (medial) side of the elbow.  Pain or weakness with gripping activities.  Pain that increases with wrist twisting motions (using a screwdriver, playing golf, bowling). CAUSES  Medial epicondylitis is caused by inflammation of the tendons that flex the wrist. Causes of injury may include:  Chronic, repetitive stress and strain to the tendons that run from the wrist and forearm to the elbow.  Sudden strain on the forearm, including wrist snap when serving balls with racquet sports, or throwing a baseball. RISK INCREASES WITH:  Sports or occupations that require repetitive and/or strenuous forearm and wrist movements (pitching a baseball, golfing, carpentry).  Poor wrist and forearm strength and flexibility.  Failure to warm up properly before activity.  Resuming activity before healing, rehabilitation, and conditioning are complete. PREVENTION   Warm up and stretch properly before activity.  Maintain physical fitness:  Strength, flexibility, and endurance.  Cardiovascular fitness.  Wear and use properly fitted equipment.  Learn and use proper technique and have a coach  correct improper technique.  Wear a tennis elbow (counterforce) brace. PROGNOSIS  The course of this condition depends on the degree of the injury. If treated properly, acute cases (symptoms lasting less than 4 weeks) are often resolved in 2 to 6 weeks. Chronic (longer lasting cases) often resolve in 3 to 6 months, but may require physical therapy. RELATED COMPLICATIONS   Frequently recurring symptoms, resulting in a chronic problem. Properly treating the problem the first time decreases frequency of recurrence.  Chronic inflammation, scarring, and partial tendon tear, requiring surgery.  Delayed healing or resolution of symptoms. TREATMENT  Treatment first involves the use of ice and medicine, to reduce pain and inflammation. Strengthening and stretching exercises may reduce discomfort, if performed regularly. These exercises may be performed at home, if the condition is an acute injury. Chronic cases may require a referral to a physical therapist for evaluation and treatment. Your caregiver may advise a corticosteroid injection to help reduce inflammation. Rarely, surgery is needed. MEDICATION  If pain medicine is needed, nonsteroidal anti-inflammatory medicines (aspirin and ibuprofen), or other minor pain relievers (acetaminophen), are often advised.  Do not take pain medicine for 7 days before surgery.  Prescription pain relievers may be given, if your caregiver thinks they are needed. Use only as directed and only as much as you need.  Corticosteroid injections may be recommended. These injections should be reserved only for the most severe cases, because they can only be given a certain number of times. HEAT AND COLD  Cold treatment (icing) should be applied for 10 to 15 minutes every 2 to 3 hours for inflammation and pain, and immediately after activity that aggravates your symptoms. Use ice packs or an ice  massage.  Heat treatment may be used before performing stretching and  strengthening activities prescribed by your caregiver, physical therapist, or athletic trainer. Use a heat pack or a warm water soak. SEEK MEDICAL CARE IF: Symptoms get worse or do not improve in 2 weeks, despite treatment. EXERCISES  RANGE OF MOTION (ROM) AND STRETCHING EXERCISES - Epicondylitis, Medial (Golfer's Elbow) These exercises may help you when beginning to rehabilitate your injury. Your symptoms may go away with or without further involvement from your physician, physical therapist or athletic trainer. While completing these exercises, remember:   Restoring tissue flexibility helps normal motion to return to the joints. This allows healthier, less painful movement and activity.  An effective stretch should be held for at least 30 seconds.  A stretch should never be painful. You should only feel a gentle lengthening or release in the stretched tissue. RANGE OF MOTION  Wrist Flexion, Active-Assisted  Extend your right / left elbow with your fingers pointing down.*  Gently pull the back of your hand towards you, until you feel a gentle stretch on the top of your forearm.  Hold this position for __________ seconds. Repeat __________ times. Complete this exercise __________ times per day.  *If directed by your physician, physical therapist or athletic trainer, complete this stretch with your elbow bent, rather than extended. RANGE OF MOTION  Wrist Extension, Active-Assisted  Extend your right / left elbow and turn your palm upwards.*  Gently pull your palm and fingertips back, so your wrist extends and your fingers point more toward the ground.  You should feel a gentle stretch on the inside of your forearm.  Hold this position for __________ seconds. Repeat __________ times. Complete this exercise __________ times per day. *If directed by your physician, physical therapist or athletic trainer, complete this stretch with your elbow bent, rather than extended. STRETCH  Wrist  Extension   Place your right / left fingertips on a tabletop leaving your elbow slightly bent. Your fingers should point backwards.  Gently press your fingers and palm down onto the table, by straightening your elbow. You should feel a stretch on the inside of your forearm.  Hold this position for __________ seconds. Repeat __________ times. Complete this stretch __________ times per day.  STRENGTHENING EXERCISES - Epicondylitis, Medial (Golfer's Elbow) These exercises may help you when beginning to rehabilitate your injury. They may resolve your symptoms with or without further involvement from your physician, physical therapist or athletic trainer. While completing these exercises, remember:   Muscles can gain both the endurance and the strength needed for everyday activities through controlled exercises.  Complete these exercises as instructed by your physician, physical therapist or athletic trainer. Increase the resistance and repetitions only as guided.  You may experience muscle soreness or fatigue, but the pain or discomfort you are trying to eliminate should never worsen during these exercises. If this pain does get worse, stop and make sure you are following the directions exactly. If the pain is still present after adjustments, discontinue the exercise until you can discuss the trouble with your caregiver. STRENGTH Wrist Flexors  Sit with your right / left forearm palm-up, and fully supported on a table or countertop. Your elbow should be resting below the height of your shoulder. Allow your wrist to extend over the edge of the surface.  Loosely holding a __________ weight, or a piece of rubber exercise band or tubing, slowly curl your hand up toward your forearm.  Hold this position for __________ seconds.  Slowly lower the wrist back to the starting position in a controlled manner. Repeat __________ times. Complete this exercise __________ times per day.  STRENGTH  Wrist  Extensors  Sit with your right / left forearm palm-down and fully supported. Your elbow should be resting below the height of your shoulder. Allow your wrist to extend over the edge of the surface.  Loosely holding a __________ weight, or a piece of rubber exercise band or tubing, slowly curl your hand up toward your forearm.  Hold this position for __________ seconds. Slowly lower the wrist back to the starting position in a controlled manner. Repeat __________ times. Complete this exercise __________ times per day.  STRENGTH - Ulnar Deviators  Stand with a ____________________ weight in your right / left hand, or sit while holding a rubber exercise band or tubing, with your healthy arm supported on a table or countertop.  Move your wrist so that your pinkie travels toward your forearm and your thumb moves away from your forearm.  Hold this position for __________ seconds and then slowly lower the wrist back to the starting position. Repeat __________ times. Complete this exercise __________ times per day STRENGTH - Grip   Grasp a tennis ball, a dense sponge, or a large, rolled sock in your hand.  Squeeze as hard as you can, without increasing any pain.  Hold this position for __________ seconds. Release your grip slowly. Repeat __________ times. Complete this exercise __________ times per day.  STRENGTH  Forearm Supinators   Sit with your right / left forearm supported on a table, keeping your elbow below shoulder height. Rest your hand over the edge, palm down.  Gently grip a hammer or a soup ladle.  Without moving your elbow, slowly turn your palm and hand upward to a "thumbs-up" position.  Hold this position for __________ seconds. Slowly return to the starting position. Repeat __________ times. Complete this exercise __________ times per day.  STRENGTH  Forearm Pronators  Sit with your right / left forearm supported on a table, keeping your elbow below shoulder height. Rest  your hand over the edge, palm up.  Gently grip a hammer or a soup ladle.  Without moving your elbow, slowly turn your palm and hand upward to a "thumbs-up" position.  Hold this position for __________ seconds. Slowly return to the starting position. Repeat __________ times. Complete this exercise __________ times per day.  Document Released: 11/22/2005 Document Revised: 02/14/2012 Document Reviewed: 03/06/2009 [facility] Patient Information 2014 [place], [place].

## 2014-01-07 NOTE — Discharge Instructions (Signed)
Arthralgia  Your caregiver has diagnosed you as suffering from an arthralgia. Arthralgia means there is pain in a joint. This can come from many reasons including:  · Bruising the joint which causes soreness (inflammation) in the joint.  · Wear and tear on the joints which occur as we grow older (osteoarthritis).  · Overusing the joint.  · Various forms of arthritis.  · Infections of the joint.  Regardless of the cause of pain in your joint, most of these different pains respond to anti-inflammatory drugs and rest. The exception to this is when a joint is infected, and these cases are treated with antibiotics, if it is a bacterial infection.  HOME CARE INSTRUCTIONS   · Rest the injured area for as long as directed by your caregiver. Then slowly start using the joint as directed by your caregiver and as the pain allows. Crutches as directed may be useful if the ankles, knees or hips are involved. If the knee was splinted or casted, continue use and care as directed. If an stretchy or elastic wrapping bandage has been applied today, it should be removed and re-applied every 3 to 4 hours. It should not be applied tightly, but firmly enough to keep swelling down. Watch toes and feet for swelling, bluish discoloration, coldness, numbness or excessive pain. If any of these problems (symptoms) occur, remove the ace bandage and re-apply more loosely. If these symptoms persist, contact your caregiver or return to this location.  · For the first 24 hours, keep the injured extremity elevated on pillows while lying down.  · Apply ice for 15-20 minutes to the sore joint every couple hours while awake for the first half day. Then 03-04 times per day for the first 48 hours. Put the ice in a plastic bag and place a towel between the bag of ice and your skin.  · Wear any splinting, casting, elastic bandage applications, or slings as instructed.  · Only take over-the-counter or prescription medicines for pain, discomfort, or fever as  directed by your caregiver. Do not use aspirin immediately after the injury unless instructed by your physician. Aspirin can cause increased bleeding and bruising of the tissues.  · If you were given crutches, continue to use them as instructed and do not resume weight bearing on the sore joint until instructed.  Persistent pain and inability to use the sore joint as directed for more than 2 to 3 days are warning signs indicating that you should see a caregiver for a follow-up visit as soon as possible. Initially, a hairline fracture (break in bone) may not be evident on X-rays. Persistent pain and swelling indicate that further evaluation, non-weight bearing or use of the joint (use of crutches or slings as instructed), or further X-rays are indicated. X-rays may sometimes not show a small fracture until a week or 10 days later. Make a follow-up appointment with your own caregiver or one to whom we have referred you. A radiologist (specialist in reading X-rays) may read your X-rays. Make sure you know how you are to obtain your X-ray results. Do not assume everything is normal if you do not hear from us.  SEEK MEDICAL CARE IF:  Bruising, swelling, or pain increases.  SEEK IMMEDIATE MEDICAL CARE IF:   · Your fingers or toes are numb or blue.  · The pain is not responding to medications and continues to stay the same or get worse.  · The pain in your joint becomes severe.  · You   develop a fever over 102° F (38.9° C).  · It becomes impossible to move or use the joint.  MAKE SURE YOU:   · Understand these instructions.  · Will watch your condition.  · Will get help right away if you are not doing well or get worse.  Document Released: 11/22/2005 Document Revised: 02/14/2012 Document Reviewed: 07/10/2008  [employer]® Patient Information ©2014 [employer].

## 2014-01-07 NOTE — ED Notes (Signed)
Started 2 days ago with right elbow pain. No known injury. Has been using heat and cold without improvement.

## 2014-01-07 NOTE — Progress Notes (Signed)
   Subjective:    Patient ID: [name], female    DOB: Apr 21, 1976, 38 y.o.   MRN: [id number]  HPI 38 year old female presents for ER followup for right elbow pain, patient was seen in the emergency room this morning and underwent evaluation for this pain, she had x-rays which were negative for fracture, patient states that approximately days ago she developed pain and some mild swelling in the right elbow, pain is worse in the anterior aspect of the elbow, she denies trauma to the area, she states that her range of motion is somewhat limited due to the pain in this area, mild associated swelling, no associated redness, no associated fevers or chills, no associated numbness or tingling in the distal extremity   Review of Systems  Musculoskeletal: Positive for arthralgias and myalgias.       Objective:   Physical Exam Vitals: Reviewed General: Pleasant obese Caucasian female, right arm in sling MSK: No visual bruising is noted of the right elbow, no significant swelling identified, tenderness is appreciated over the antecubital area as well as the medial flexor tendons, patient has full range of motion however this is limited due to have significant pain with flexion and extension of the extremity, patient has reproduction of her pain with flexion of the wrist Skin: No redness of the elbow, no drainage       Assessment & Plan:  Please see problem specific assessment and plan.

## 2014-01-07 NOTE — Assessment & Plan Note (Signed)
Patient reports with two-day history of right elbow pain. Imaging performed in the ER this morning was unremarkable for fracture. Suspect medial epicondylitis based on physical examination. -Conservative measures including rest, ice, elevation, nonsteroidal anti-inflammatories were discussed with the patient -Counseled patient that symptoms may persist for days to weeks however should improve gradually

## 2014-01-11 ENCOUNTER — Telehealth: Payer: Self-pay | Admitting: Family Medicine

## 2014-01-11 DIAGNOSIS — G4733 Obstructive sleep apnea (adult) (pediatric): Secondary | ICD-10-CM

## 2014-01-11 MED ORDER — HYDROCODONE-ACETAMINOPHEN 10-325 MG PO TABS: 1.0000 | ORAL_TABLET | Freq: Three times a day (TID) | ORAL | Status: DC | PRN

## 2014-01-11 NOTE — Telephone Encounter (Signed)
Okay to refill early this one time. Rx left at front desk for pt.  Sleep study was to titrate her CPAP to the correct setting, which is 13 CWP. If we need to order [place] to set up her correct settings, I can order that for her.   [place] red team, please call pt to inform of rx and sleep study results and reply to me if she needs home health ordered for the CPAP.  Thanks! [name], MD

## 2014-01-11 NOTE — Telephone Encounter (Signed)
Patient will need a refill for hydrocodone. She runs out next week. [name] out of office for the next two weeks. Patient states she will make an appt to see her upon her return. Patient also would like to have results for sleep study.

## 2014-01-11 NOTE — Telephone Encounter (Signed)
Will fwd to MD.  Jayvier Burgher L, CMA  

## 2014-01-14 ENCOUNTER — Other Ambulatory Visit: Payer: Self-pay | Admitting: Family Medicine

## 2014-01-14 ENCOUNTER — Encounter: Payer: Self-pay | Admitting: Family Medicine

## 2014-01-14 ENCOUNTER — Ambulatory Visit (INDEPENDENT_AMBULATORY_CARE_PROVIDER_SITE_OTHER): Payer: Medicaid Other | Admitting: Family Medicine

## 2014-01-14 VITALS — BP 140/90 | HR 86 | Temp 97.8°F | Ht 62.0 in | Wt 318.0 lb

## 2014-01-14 DIAGNOSIS — M25561 Pain in right knee: Secondary | ICD-10-CM

## 2014-01-14 DIAGNOSIS — M1710 Unilateral primary osteoarthritis, unspecified knee: Secondary | ICD-10-CM

## 2014-01-14 DIAGNOSIS — M170 Bilateral primary osteoarthritis of knee: Secondary | ICD-10-CM

## 2014-01-14 DIAGNOSIS — G4733 Obstructive sleep apnea (adult) (pediatric): Secondary | ICD-10-CM

## 2014-01-14 DIAGNOSIS — M25569 Pain in unspecified knee: Secondary | ICD-10-CM

## 2014-01-14 DIAGNOSIS — IMO0002 Reserved for concepts with insufficient information to code with codable children: Secondary | ICD-10-CM

## 2014-01-14 DIAGNOSIS — M25562 Pain in left knee: Secondary | ICD-10-CM

## 2014-01-14 NOTE — Patient Instructions (Signed)
Ms [name], It was great to meet you today. I am sorry that you are in so much discomfort. As we discussed please continue with the norco as this seems to help your symptoms and will ultimately help you be more mobile. Great job with your weight loss! Keep up the good work Physical therapy will call you to set up an appointment to help with exercising strengthening of the hip and quad muscles.  I will let Dr. [name] know about the CPAP studies. Please make an appointment with her if you would like to discuss further Feel better soon [name], MD

## 2014-01-14 NOTE — Telephone Encounter (Signed)
Pt was given the rx. michellle simpson, cma

## 2014-01-14 NOTE — Telephone Encounter (Signed)
Called pt and no answer or vm. Michellesimpson, cma

## 2014-01-14 NOTE — Progress Notes (Signed)
Patient ID: [name], female   DOB: 12-Jan-1976, 38 y.o.   MRN: [id number] [facility] [name], MD Phone: [phone]  Subjective:  [name] is a 37y.o F with hx of bilat knee pain 2/2 OA, presenting for SDA.  #bilat knee pain -no recent trauma or inciting factors, denies locking, popping or knees giving way; does however report a history of knees giving out -has tried conservative therapy including icing and heat and more recently with US guided CS injections at bilat knees with minimal relief -presents today bc has been out of norco 10s (usually takes 3 a day) which are typically the only thing that keep her comfortable -was not aware that Dr. [name] already had left prescription at front desk -also continuing to work on weight loss, is down 12 lbs from last year, states that she tries to walk 30 mins every day  #CPAP -had sleep study 01/02 (specifically targeted at titration); does not report any palps, chest pain or SOB but wondering if home care company will be coming to titrate settings on machine -reports regular nightly compliance   All systems were reviewed and were negative unless otherwise noted in the HPI  Past Medical History Patient Active Problem List   Diagnosis Date Noted  . Elbow pain, right 01/07/2014  . Urge incontinence 10/15/2013  . Insomnia 08/14/2013  . Encounter for chronic pain management 06/30/2013  . Neck abscess 05/13/2013  . Low back pain 01/31/2013  . HLD (hyperlipidemia) 11/19/2012  . Chest pain 06/27/2012  . Epigastric pain 06/22/2012  . Right carpal tunnel syndrome 09/01/2011  . Bilateral knee pain 09/01/2011  . DM (diabetes mellitus), type 2, uncontrolled 05/22/2008  . Morbid obesity 05/22/2008  . 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 DEPRESSION 05/22/2008  . OBSTRUCTIVE SLEEP APNEA 05/22/2008  . HYPERTENSION 05/22/2008  . ASTHMA 05/22/2008  . GERD 05/22/2008   Reviewed problem  list.  Medications- reviewed and updated Chief complaint-noted No additions to family history Social history- patient is a previous smoker  Objective: BP 140/90  Pulse 86  Temp(Src) 97.8 F (36.6 C) (Oral)  Ht 5\' 2"  (1.575 m)  Wt 318 lb (144.244 kg)  BMI 58.15 kg/m2  LMP 12/31/2013 Gen: NAD, alert, cooperative with exam Ext: Mild edema bilat, warm, normal tone, moves UE/LE spontaneously; palpable crepitus bilaterally, post surgical scars on left knee; non tender to palpable over medial joint line, no redness or efffusion, ROM limited by obesity but strength 5/5 throughout LE Neuro: Alert and oriented, No gross deficits Skin: no rashes no lesions  Assessment/Plan: See problem based a/p

## 2014-01-14 NOTE — Assessment & Plan Note (Signed)
A: presented today bc was out of pain medication for the last several days, however rx already processed and placed in front office by PCP; pt reports toleration of sx on norco 10 P: cont with pain meds per PCP -referral placed to PT for specific targeted strengthening of hip abductor and quads -pt instructed to cont to be as mobile as possible -is on board with continuing weight loss management

## 2014-01-14 NOTE — Assessment & Plan Note (Signed)
>>  ASSESSMENT AND PLAN FOR CHRONIC PAIN OF BOTH KNEES WRITTEN ON 01/14/2014 10:25 AM BY MARSH, MELANIE C, MD  A: presented today bc was out of pain medication for the last several days, however rx already processed and placed in front office by PCP; pt reports toleration of sx on norco 10 P: cont with pain meds per PCP -referral placed to PT for specific targeted strengthening of hip abductor and quads -pt instructed to cont to be as mobile as possible -is on board with continuing weight loss management

## 2014-01-14 NOTE — Assessment & Plan Note (Signed)
A: CPAP titrated per study in 12/07/13; CWP 13. Per pt report, [facility] has not come to her home to titrate settings P: Recommend pt f/up with PCP for further discussion -will forward msg to PCP to coordinate care with pt and home care company ([facility]

## 2014-01-17 ENCOUNTER — Other Ambulatory Visit: Payer: Self-pay | Admitting: Family Medicine

## 2014-01-25 NOTE — Telephone Encounter (Signed)
Order re-entered. Thanks! [name], MD

## 2014-01-25 NOTE — Addendum Note (Signed)
Addended by: [name] on: 01/25/2014 05:25 PM   Modules accepted: Orders

## 2014-01-25 NOTE — Telephone Encounter (Signed)
Melissa from [facility] calls. States that RX sent over for patient CPAP titration did not have a pressure listed on it. Please include the pressure of 13 CWP on the RX and resend. Thanks!

## 2014-01-29 IMAGING — CT CT ABD-PELV W/ CM
2 of 5 series · 16 of 46 positions shown, 18 images · IV contrast (APPLIED)
Comparison: Prior CT scan abdomen/pelvis 12/01/2011

CLINICAL DATA: Vaginal pain

CT ABDOMEN AND PELVIS WITH CONTRAST
TECHNIQUE: Multidetector CT imaging of the abdomen and pelvis was
performed following the standard protocol during bolus
administration of intravenous contrast.
Contrast: 100mL OMNIPAQUE IOHEXOL 300 MG/ML  SOLN

[Series 2: abd/pelv with 5.0 b31f st · axial · 0.87mm/px · z∈[-538,-113]mm · 13 of 95 slices shown, 15 images]
[im 5/95  soft-tissue]
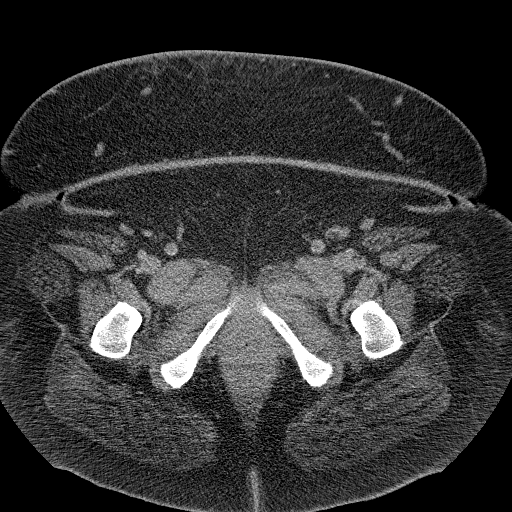
[im 5/95  bone]
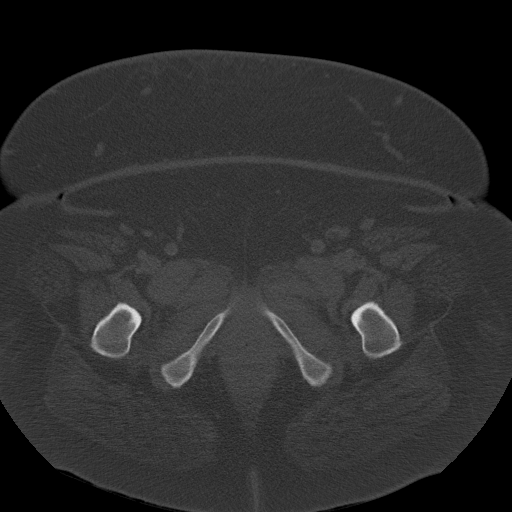
[im 14/95  soft-tissue]
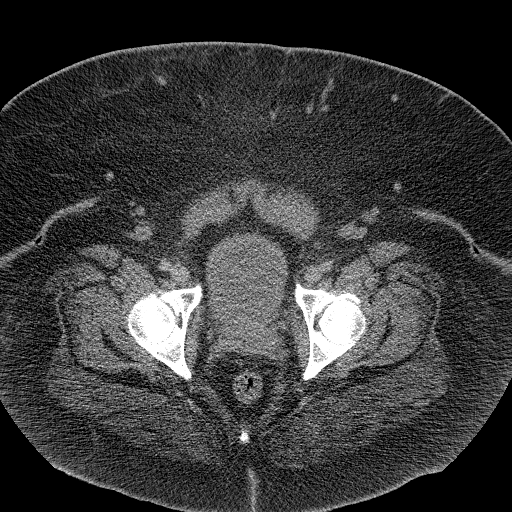
[im 18/95  soft-tissue]
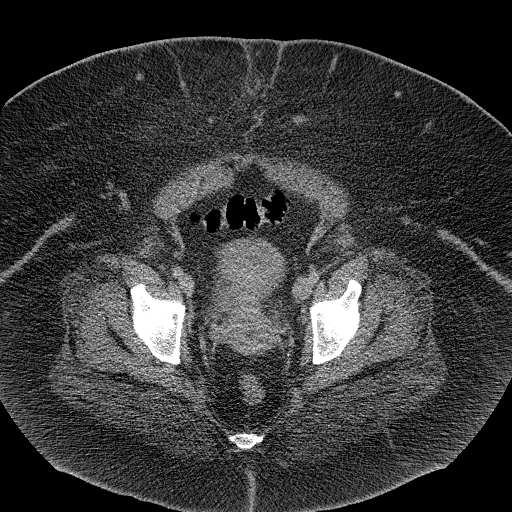
[im 27/95  soft-tissue]
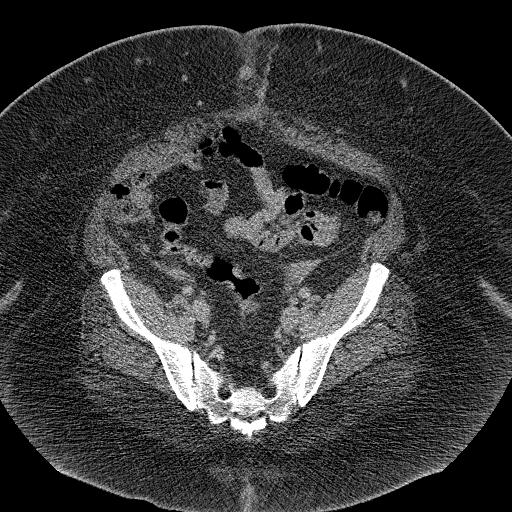
[im 32/95  soft-tissue]
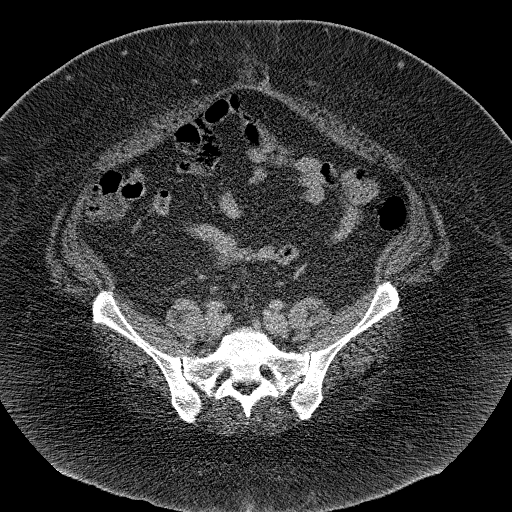
[im 41/95  soft-tissue]
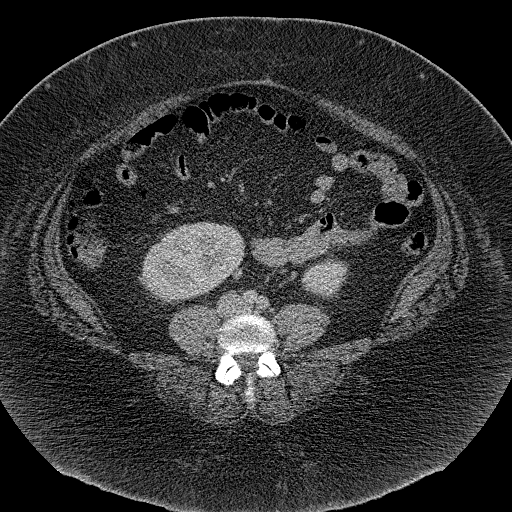
[im 50/95  soft-tissue]
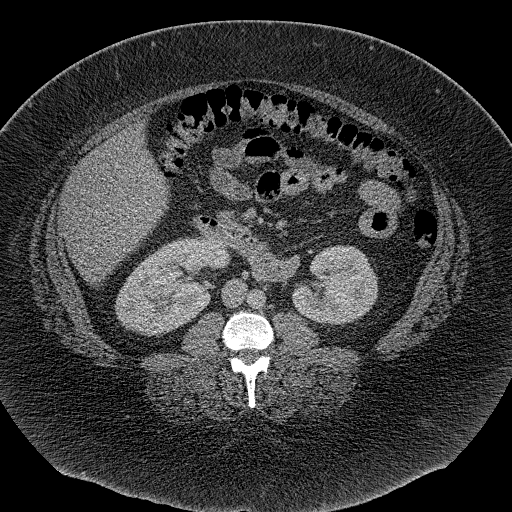
[im 54/95  soft-tissue]
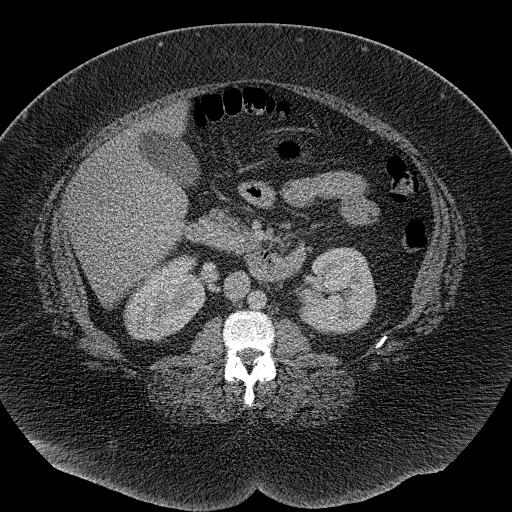
[im 63/95  soft-tissue]
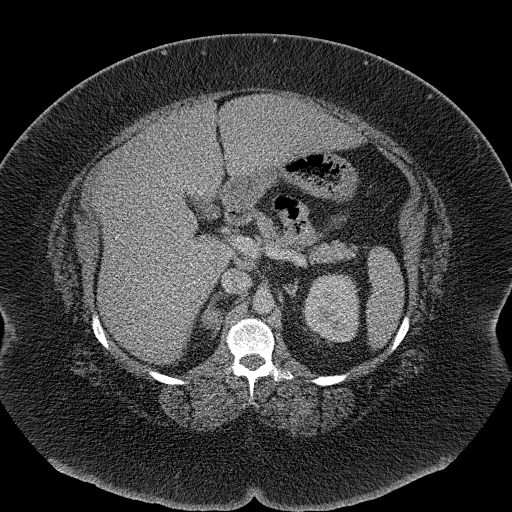
[im 63/95  bone]
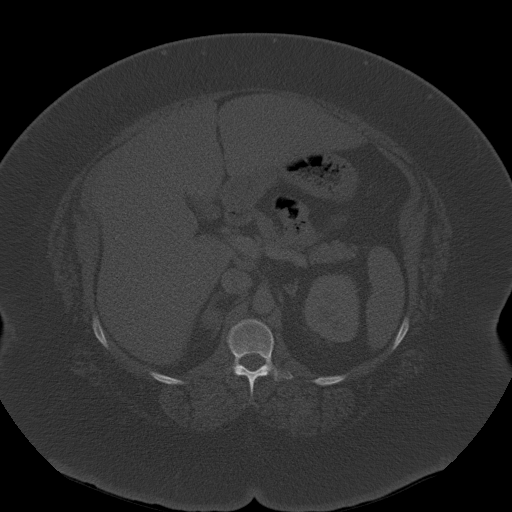
[im 68/95  soft-tissue]
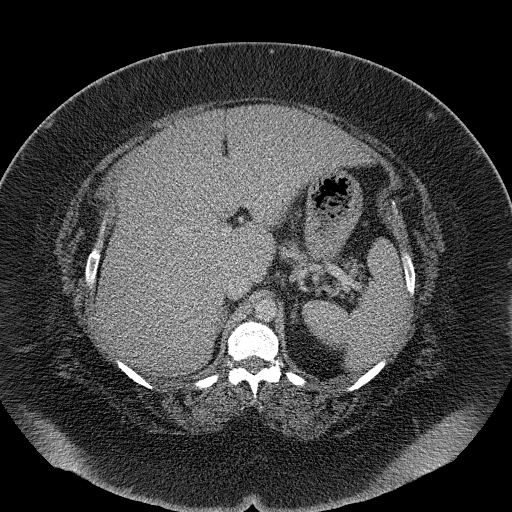
[im 77/95  soft-tissue]
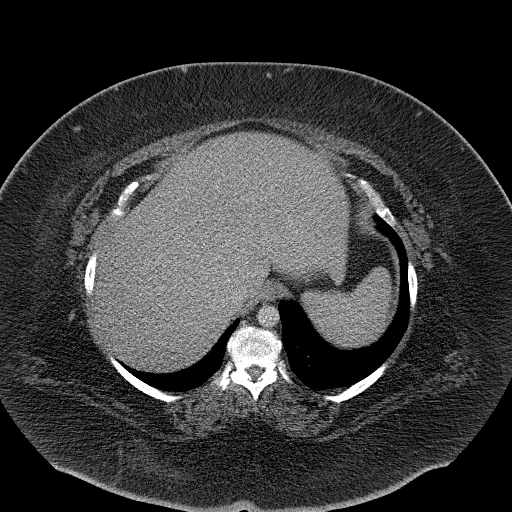
[im 81/95  soft-tissue]
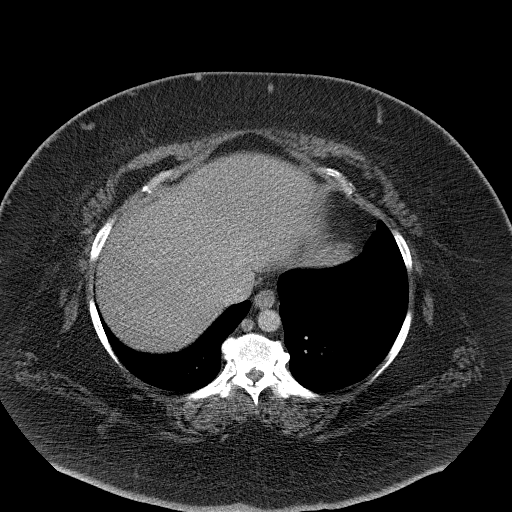
[im 90/95  soft-tissue]
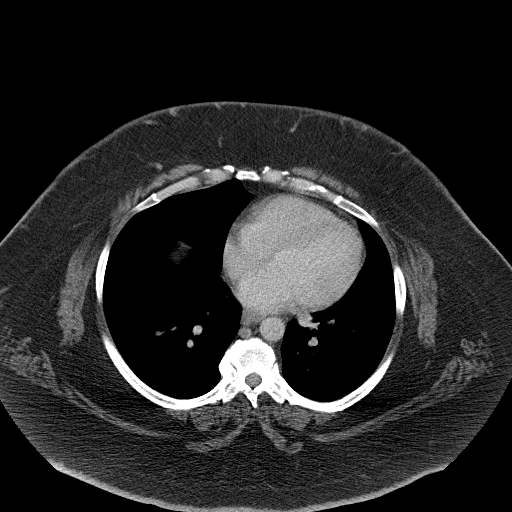

[Series 6: coronals · coronal · 0.93mm/px · 3 of 172 slices shown]
[im 58/172  soft-tissue]
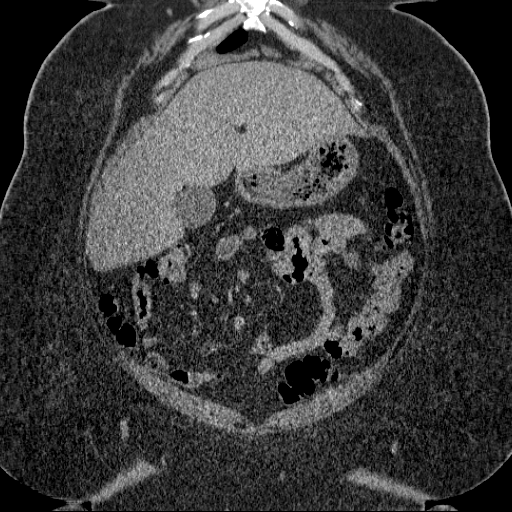
[im 77/172  soft-tissue]
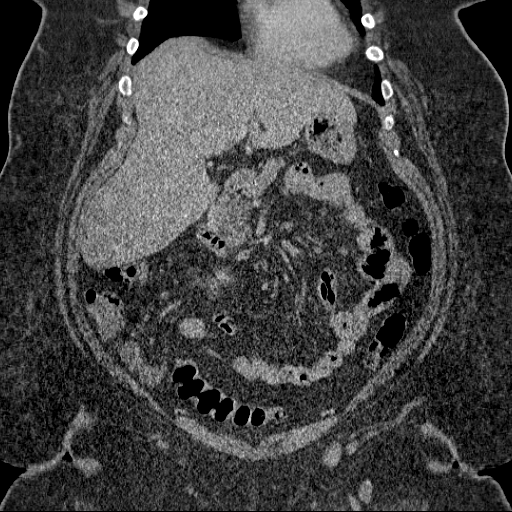
[im 96/172  soft-tissue]
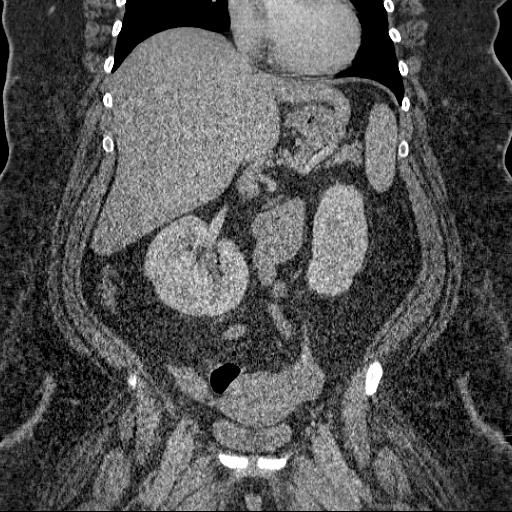

[16 of 46 positions shown; findings below may reference images not displayed]

FINDINGS: Lower Chest:  The lung bases are clear.  No focal consolidation or
suspicious pulmonary nodule.  Visualized cardiac structures within
normal limits for size.  No pericardial effusion.  Unremarkable
visualized distal thoracic esophagus.

Abdomen: Unremarkable CT appearance of the stomach, duodenum,
spleen, and left adrenal gland.  Stable 2.7 cm right adrenal
adenoma.  Mild fatty atrophy of the pancreatic head and uncinate
process.  Otherwise, the pancreas is unremarkable.  No focal
hepatic lesion. Gallbladder is unremarkable. No intra or
extrahepatic biliary ductal dilatation.

Similar appearance of incompletely rotated right kidney.  No
evidence of focal renal lesion or hydronephrosis affecting either
kidney.  No focal bowel wall thickening or dilated bowel.  Normal
appendix in the right lower quadrant.  No significant colonic
diverticular disease and no free fluid or suspicious adenopathy.

Pelvis: Unremarkable appearance of the uterus, bilateral adnexa and
partially distended bladder. No focal fluid collection, or
inflammation associated with the vagina or visualized external
genitalia.  Specifically, no Bartholin gland cyst or abscess.
Significantly improved reticulation and edema within the
subcutaneous fat of the hand is consistent with resolved
panniculitis.  Healed incision to the left of midline in the
anterior abdominal wall.

Bones: No acute fracture or aggressive appearing lytic or blastic
osseous lesion.

Vascular: No focal vascular abnormality
IMPRESSION: 1.  No acute abnormality in the abdomen, pelvis or involving the
visualized portions of the vagina and external genitalia to explain
the patient's clinical symptoms.
2.  Stable right adrenal adenoma.
3.  Significantly improved panniculitis compared to 12/01/2011.

## 2014-02-04 ENCOUNTER — Ambulatory Visit (INDEPENDENT_AMBULATORY_CARE_PROVIDER_SITE_OTHER): Payer: Medicaid Other | Admitting: Family Medicine

## 2014-02-04 ENCOUNTER — Encounter: Payer: Self-pay | Admitting: Family Medicine

## 2014-02-04 VITALS — BP 139/88 | HR 105 | Temp 98.6°F | Ht 62.0 in | Wt 320.0 lb

## 2014-02-04 DIAGNOSIS — E1165 Type 2 diabetes mellitus with hyperglycemia: Secondary | ICD-10-CM

## 2014-02-04 DIAGNOSIS — Z124 Encounter for screening for malignant neoplasm of cervix: Secondary | ICD-10-CM

## 2014-02-04 DIAGNOSIS — Z7189 Other specified counseling: Secondary | ICD-10-CM

## 2014-02-04 DIAGNOSIS — IMO0002 Reserved for concepts with insufficient information to code with codable children: Secondary | ICD-10-CM

## 2014-02-04 DIAGNOSIS — I10 Essential (primary) hypertension: Secondary | ICD-10-CM

## 2014-02-04 DIAGNOSIS — IMO0001 Reserved for inherently not codable concepts without codable children: Secondary | ICD-10-CM

## 2014-02-04 DIAGNOSIS — G8929 Other chronic pain: Secondary | ICD-10-CM

## 2014-02-04 LAB — POCT GLYCOSYLATED HEMOGLOBIN (HGB A1C): Hemoglobin A1C: >14

## 2014-02-04 MED ORDER — HYDROCODONE-ACETAMINOPHEN 10-325 MG PO TABS: 1.0000 | ORAL_TABLET | Freq: Three times a day (TID) | ORAL | Status: DC | PRN

## 2014-02-04 MED ORDER — TRAMADOL HCL 50 MG PO TABS: 50.0000 mg | ORAL_TABLET | Freq: Every day | ORAL | Status: DC | PRN

## 2014-02-04 MED ORDER — CYCLOBENZAPRINE HCL 10 MG PO TABS: 10.0000 mg | ORAL_TABLET | Freq: Every day | ORAL | Status: DC | PRN

## 2014-02-04 NOTE — Progress Notes (Signed)
Patient ID: [name], female   DOB: 10-10-1976, 38 y.o.   MRN: [id number]  HPI:  Diabetes: Currently taking lantus 115 units BID and novology 60mg  TID with meals, plus byetta 43mcg daily and metformin 500mg  BID. Reports her CBG's have ranged from 160's to 170's postprandial, and are 80 in the morning when she is fasting. She never gets below 80. She has an upcoming appointment for her eye exam. She reports excellent compliance with her insulin regimen.   HTN: she does not check her blood pressure at home. Currently taking lisinopril 10mg  daily. Denies chest pain or SOB.  Chronic pain: Currently takes norco 10-325mg  1 tab TID prn, flexeril 10mg  BID prn, and tramadol 50mg  1 tab 1-2x daily prn. States her knee pain is well controlled on this regimen. She is able to continue exercising. She has not had problems with the medications, or any side effects. She has been walking twice a day 20-25 minute at a time and is working to increase how much she is walking. We have agreed in the past that we would decrease one of her controlled substances at each chronic pain visit, and today she would like to stop her ambien completely.  ROS: See HPI. Denies any palpitations.  [place]: hx poorly controlled DM, HTN, morbid obesity. Recently had severe side pain concerning for neuropathic pain, states that is now completely resolved.  PHYSICAL EXAM: BP 139/88  Pulse 105  Temp(Src) 98.6 F (37 C) (Oral)  Ht 5\' 2"  (1.575 m)  Wt 320 lb (145.151 kg)  BMI 58.51 kg/m2  LMP 12/31/2013 Gen: NAD HEENT: NCAT Heart: RRR Lungs: CTAB, NWOB Abdomen: obese Neuro: grossly nonfocal, speech intact Ext: bilateral knees without deformity or TTP. Full strength with knee extension & flexion bilaterally. Exam limited by morbid obesity.  ASSESSMENT/PLAN:  # Health maintenance:  -pt is due for pap smear and would like to go to her OB/GYN office to have this done. Has seen Dr. Willis Modena in the past, needs a referral due to  having Medicaid. Will enter referral.  See problem based charting for additional assessment/plan.  FOLLOW UP: F/u in 3 months for chronic pain and hypertension.  [place]. [name], [place]

## 2014-02-04 NOTE — Patient Instructions (Signed)
It was great to see you again today!  I am referring you to endocrinology for your diabetes. You will get a phone call to schedule this appointment.  I also put in a refill to Dr. Willis Modena for your pap smear.  We refilled your medicines for 3 months worth. We are STOPPING ambien today and decreasing flexeril to once daily as needed.  Follow up with me in 3 months or sooner if you have any problems.  Be well, Dr. [name]

## 2014-02-05 NOTE — Assessment & Plan Note (Signed)
A1c >14, suggesting very poor chronic control. This does not correlate with what pt reports getting when she checks her CBG's. Given that she is on such a high dose insulin regimen and is reporting good fasting CBG's, will refer to endocrinology for specialty assistance in getting her diabetes under control. Precepted with Dr. Gwendlyn Deutscher who agrees with this plan.   Cardiac: on statin Renal: on lisinopril Eye: has upcoming appointment Foot: not due for foot exam until May 2015

## 2014-02-05 NOTE — Assessment & Plan Note (Signed)
Symptoms well controlled. Will refill medications x 3 months. Will decrease flexeril to daily prn, and stop ambien altogether. F/u in 3 months.

## 2014-02-05 NOTE — Assessment & Plan Note (Signed)
>>  ASSESSMENT AND PLAN FOR INSULIN DEPENDENT TYPE 2 DIABETES MELLITUS ([place]) WRITTEN ON 02/05/2014 11:41 AM BY Kerly Rigsbee, [name], MD  A1c >14, suggesting very poor chronic control. This does not correlate with what pt reports getting when she checks her CBG's. Given that she is on such a high dose insulin regimen and is reporting good fasting CBG's, will refer to endocrinology for specialty assistance in getting her diabetes under control. Precepted with Dr. Gwendlyn Deutscher who agrees with this plan.   Cardiac: on statin Renal: on lisinopril Eye: has upcoming appointment Foot: not due for foot exam until May 2015

## 2014-02-05 NOTE — Assessment & Plan Note (Signed)
BP at goal , continue current regimen

## 2014-02-12 ENCOUNTER — Ambulatory Visit: Payer: Medicaid Other | Admitting: Endocrinology

## 2014-02-14 ENCOUNTER — Other Ambulatory Visit: Payer: Self-pay | Admitting: Family Medicine

## 2014-02-15 MED ORDER — EXENATIDE 5 MCG/0.02ML ~~LOC~~ SOPN: 5.0000 ug | PEN_INJECTOR | Freq: Two times a day (BID) | SUBCUTANEOUS | Status: DC

## 2014-03-12 ENCOUNTER — Other Ambulatory Visit: Payer: Self-pay | Admitting: Family Medicine

## 2014-03-12 ENCOUNTER — Telehealth: Payer: Self-pay | Admitting: Family Medicine

## 2014-03-12 MED ORDER — INSULIN ASPART 100 UNIT/ML ~~LOC~~ SOLN: 60.0000 [IU] | Freq: Three times a day (TID) | SUBCUTANEOUS | Status: DC

## 2014-03-12 MED ORDER — LISINOPRIL 10 MG PO TABS: 10.0000 mg | ORAL_TABLET | Freq: Every day | ORAL | Status: DC

## 2014-03-12 NOTE — Telephone Encounter (Signed)
Emergency Line Call  Patient calls stating she has had chest pain all day. She notes that it gets worse with walking around and has been improved when she takes the nitro. She denies dyspnea or diaphoresis. She notes previously having chest pain and seeing a cardiologist. She has had a cath per her report that was normal. She notes feeling weak and sluggish. Given the chest pain and typical features I advised her to come to the ED for further evaluation. She acknowledged this and stated she would come to the ED.  Tommi Rumps, MD

## 2014-03-13 ENCOUNTER — Other Ambulatory Visit: Payer: Self-pay

## 2014-03-13 ENCOUNTER — Emergency Department (HOSPITAL_COMMUNITY)
Admission: EM | Admit: 2014-03-13 | Discharge: 2014-03-13 | Disposition: A | Discharge status: Home / Self Care | Payer: Medicaid Other | Attending: Emergency Medicine | Admitting: Emergency Medicine

## 2014-03-13 ENCOUNTER — Emergency Department (HOSPITAL_COMMUNITY): Payer: Medicaid Other

## 2014-03-13 ENCOUNTER — Encounter (HOSPITAL_COMMUNITY): Payer: Self-pay | Admitting: Emergency Medicine

## 2014-03-13 DIAGNOSIS — J4489 Other specified chronic obstructive pulmonary disease: Secondary | ICD-10-CM | POA: Insufficient documentation

## 2014-03-13 DIAGNOSIS — J449 Chronic obstructive pulmonary disease, unspecified: Secondary | ICD-10-CM | POA: Insufficient documentation

## 2014-03-13 DIAGNOSIS — E785 Hyperlipidemia, unspecified: Secondary | ICD-10-CM | POA: Insufficient documentation

## 2014-03-13 DIAGNOSIS — Z79899 Other long term (current) drug therapy: Secondary | ICD-10-CM | POA: Insufficient documentation

## 2014-03-13 DIAGNOSIS — Z872 Personal history of diseases of the skin and subcutaneous tissue: Secondary | ICD-10-CM | POA: Insufficient documentation

## 2014-03-13 DIAGNOSIS — Z794 Long term (current) use of insulin: Secondary | ICD-10-CM | POA: Insufficient documentation

## 2014-03-13 DIAGNOSIS — Z87891 Personal history of nicotine dependence: Secondary | ICD-10-CM | POA: Insufficient documentation

## 2014-03-13 DIAGNOSIS — F3289 Other specified depressive episodes: Secondary | ICD-10-CM | POA: Insufficient documentation

## 2014-03-13 DIAGNOSIS — K219 Gastro-esophageal reflux disease without esophagitis: Secondary | ICD-10-CM | POA: Insufficient documentation

## 2014-03-13 DIAGNOSIS — F329 Major depressive disorder, single episode, unspecified: Secondary | ICD-10-CM | POA: Insufficient documentation

## 2014-03-13 DIAGNOSIS — I10 Essential (primary) hypertension: Secondary | ICD-10-CM | POA: Insufficient documentation

## 2014-03-13 DIAGNOSIS — M129 Arthropathy, unspecified: Secondary | ICD-10-CM | POA: Insufficient documentation

## 2014-03-13 DIAGNOSIS — Z8669 Personal history of other diseases of the nervous system and sense organs: Secondary | ICD-10-CM | POA: Insufficient documentation

## 2014-03-13 DIAGNOSIS — Z9889 Other specified postprocedural states: Secondary | ICD-10-CM | POA: Insufficient documentation

## 2014-03-13 DIAGNOSIS — E119 Type 2 diabetes mellitus without complications: Secondary | ICD-10-CM | POA: Insufficient documentation

## 2014-03-13 DIAGNOSIS — R079 Chest pain, unspecified: Secondary | ICD-10-CM | POA: Insufficient documentation

## 2014-03-13 DIAGNOSIS — Z862 Personal history of diseases of the blood and blood-forming organs and certain disorders involving the immune mechanism: Secondary | ICD-10-CM | POA: Insufficient documentation

## 2014-03-13 LAB — CBC
HEMATOCRIT: 37.6 % (ref 36.0–46.0)
Hemoglobin: 13.4 g/dL (ref 12.0–15.0)
MCH: 31.1 pg (ref 26.0–34.0)
MCHC: 35.6 g/dL (ref 30.0–36.0)
MCV: 87.2 fL (ref 78.0–100.0)
Platelets: 285 10*3/uL (ref 150–400)
RBC: 4.31 MIL/uL (ref 3.87–5.11)
RDW: 12.4 % (ref 11.5–15.5)
WBC: 10.5 10*3/uL (ref 4.0–10.5)

## 2014-03-13 LAB — BASIC METABOLIC PANEL
BUN: 9 mg/dL (ref 6–23)
CHLORIDE: 97 meq/L (ref 96–112)
CO2: 23 meq/L (ref 19–32)
CREATININE: 0.53 mg/dL (ref 0.50–1.10)
Calcium: 8.7 mg/dL (ref 8.4–10.5)
GFR calc non Af Amer: >90 mL/min (ref >90)
Glucose, Bld: 365 mg/dL — ABNORMAL HIGH (ref 70–99)
POTASSIUM: 3.5 meq/L — AB (ref 3.7–5.3)
SODIUM: 136 meq/L — AB (ref 137–147)

## 2014-03-13 LAB — D-DIMER, QUANTITATIVE: D-Dimer, Quant: 0.41 ug/mL-FEU (ref 0.00–0.48)

## 2014-03-13 LAB — I-STAT TROPONIN, ED: TROPONIN I, POC: 0.01 ng/mL (ref 0.00–0.08)

## 2014-03-13 MED ORDER — GI COCKTAIL ~~LOC~~
ORAL | Status: AC
  Administered 2014-03-13: 05:00:00
  Filled 2014-03-13: qty 30

## 2014-03-13 NOTE — ED Provider Notes (Signed)
CSN: [id number]     Arrival date & time 03/13/14  0002 History   First MD Initiated Contact with Patient 03/13/14 [phone]     Chief Complaint  Patient presents with  . Chest Pain     (Consider location/radiation/quality/duration/timing/severity/associated sxs/prior Treatment) Patient is a 38 y.o. female presenting with chest pain. The history is provided by the patient.  Chest Pain She has had constant left-sided chest pain since 10 AM. It is a pressure feeling with occasional stabbing components. There is mild radiation to the left upper arm but no radiation to the back, neck, jaw. There is no associated dyspnea, nausea, diaphoresis. She did take nitroglycerin on 2 occasions which gave partial but temporary relief. She did notice that discomfort was worse if she exerted herself but nothing seems to make it better except for the nitroglycerin. She does relate that she had been evaluated by a heart doctor and had a catheterization which showed no significant coronary artery disease. She does have risk factors of hypertension, diabetes, hyperlipidemia.  Past Medical History  Diagnosis Date  . Morbid obesity   . Alveolar hypoventilation   . Asthma   . GERD (gastroesophageal reflux disease)   . Rectal fissure   . Hypertension   . Costochondritis   . Cellulitis 08/2010-08/2011  . Depression   . Bipolar 2 disorder   . Obstructive sleep apnea   . Chest pain     NO CAD by cath 9/79/89, nl LV systolic fxn  . Diabetes mellitus 2000    Type 2, Uncontrolled  . Arthritis   . HLD (hyperlipidemia)   . Anemia   . COPD (chronic obstructive pulmonary disease)    Past Surgical History  Procedure Laterality Date  . Carpal tunnel release    . Bil knee surgery    . Mass excision N/A 06/29/2013    Procedure:  WIDE LOCAL EXCISION OF POSTERIOR NECK ABSCESS;  Surgeon: Ralene Ok, MD;  Location: [place];  Service: General;  Laterality: N/A;  . Cardiac catheterization     Family History  Problem  Relation Age of Onset  . Diabetes Mother   . Hyperlipidemia Mother   . Depression Mother   . Heart attack Paternal Uncle   . Heart disease Paternal Grandmother   . Heart attack Paternal Grandmother   . Heart attack Paternal Grandfather   . Heart disease Paternal Grandfather    History  Substance Use Topics  . Smoking status: Former Smoker -- 0.30 packs/day for .3 years    Types: Cigarettes    Quit date: 12/06/1993  . Smokeless tobacco: Never Used  . Alcohol Use: No   OB History   Grav Para Term Preterm Abortions TAB SAB Ect Mult Living                 Review of Systems  Cardiovascular: Positive for chest pain.  All other systems reviewed and are negative.     Allergies  Kiwi extract and Nubain  Home Medications   Current Outpatient Rx  Name  Route  Sig  Dispense  Refill  . atorvastatin (LIPITOR) 20 MG tablet   Oral   Take 20 mg by mouth daily.         . budesonide-formoterol (SYMBICORT) 160-4.5 MCG/ACT inhaler   Inhalation   Inhale 2 puffs into the lungs 2 (two) times daily.         . carvedilol (COREG) 12.5 MG tablet   Oral   Take 12.5 mg by mouth 2 (two)  times daily with a meal.         . cetirizine (ZYRTEC) 10 MG tablet   Oral   Take 10 mg by mouth daily as needed.          . cyclobenzaprine (FLEXERIL) 10 MG tablet   Oral   Take 1 tablet (10 mg total) by mouth daily as needed for muscle spasms. Space refills 30 days apart   30 tablet   2   . exenatide (BYETTA) 5 MCG/0.02ML SOPN injection   Subcutaneous   Inject 0.02 mLs (5 mcg total) into the skin 2 (two) times daily with a meal.   1.2 mL   3   . HYDROcodone-acetaminophen (NORCO) 10-325 MG per tablet   Oral   Take 1 tablet by mouth every 8 (eight) hours as needed.   90 tablet   0     Fill 30 days after last fill, do not fill before 5 ...   . insulin aspart (NOVOLOG) 100 UNIT/ML injection   Subcutaneous   Inject 60 Units into the skin 3 (three) times daily with meals.   10 mL    5   . Insulin Glargine (LANTUS SOLOSTAR) 100 UNIT/ML SOPN   Subcutaneous   Inject 115 Units into the skin 2 (two) times daily.   5 pen   10   . lisinopril (PRINIVIL,ZESTRIL) 10 MG tablet   Oral   Take 1 tablet (10 mg total) by mouth daily.   30 tablet   5   . metFORMIN (GLUCOPHAGE) 500 MG tablet      TAKE 1 TABLET BY MOUTH TWICE DAILY WITH A MEAL   60 tablet   3   . nitroGLYCERIN (NITROSTAT) 0.4 MG SL tablet   Sublingual   Place 1 tablet (0.4 mg total) under the tongue every 5 (five) minutes as needed for chest pain.   50 tablet   3   . nystatin (MYCOSTATIN) powder   Topical   Apply 1 Units topically daily as needed. Applies to rash or irritation  under stomach         . pantoprazole (PROTONIX) 40 MG tablet   Oral   Take 40 mg by mouth 2 (two) times daily.          Marland Kitchen PROAIR HFA 108 (90 BASE) MCG/ACT inhaler      INHALE 2 PUFFS BY MOUTH EVERY 6 HOURS AS NEEDED FOR WHEEZING   8.5 g   3   . traMADol (ULTRAM) 50 MG tablet      TAKE 1 TABLET BY MOUTH EVERY DAY AS NEEDED FOR PAIN   30 tablet   2     Space refills 30 days apart    BP 123/80  Pulse 99  Temp(Src) 98.3 F (36.8 C) (Oral)  Resp 14  Ht 5\' 2"  (1.575 m)  Wt 325 lb (147.419 kg)  BMI 59.43 kg/m2  SpO2 0%  LMP 02/17/2014 Physical Exam  Nursing note and vitals reviewed.  Morbidly obese 38 year old female, resting comfortably and in no acute distress. Vital signs are normal. Oxygen saturation is 100%, which is normal. Head is normocephalic and atraumatic. PERRLA, EOMI. Oropharynx is clear. Neck is nontender and supple without adenopathy or JVD. Back is nontender and there is no CVA tenderness. Lungs are clear without rales, wheezes, or rhonchi. Chest is nontender. Heart has regular rate and rhythm without murmur. Abdomen is soft, flat, nontender without masses or hepatosplenomegaly and peristalsis is normoactive. Extremities have trace edema, full  range of motion is present. Skin is warm and  dry without rash. Neurologic: Mental status is normal, cranial nerves are intact, there are no motor or sensory deficits.  ED Course  Procedures (including critical care time) Labs Review Results for orders placed during the hospital encounter of 08/65/78  BASIC METABOLIC PANEL      Result Value Ref Range   Sodium 136 (*) 137 - 147 mEq/L   Potassium 3.5 (*) 3.7 - 5.3 mEq/L   Chloride 97  96 - 112 mEq/L   CO2 23  19 - 32 mEq/L   Glucose, Bld 365 (*) 70 - 99 mg/dL   BUN 9  6 - 23 mg/dL   Creatinine, Ser 0.53  0.50 - 1.10 mg/dL   Calcium 8.7  8.4 - 10.5 mg/dL   GFR calc non Af Amer >90  >90 mL/min   GFR calc Af Amer >90  >90 mL/min  CBC      Result Value Ref Range   WBC 10.5  4.0 - 10.5 K/uL   RBC 4.31  3.87 - 5.11 MIL/uL   Hemoglobin 13.4  12.0 - 15.0 g/dL   HCT 37.6  36.0 - 46.0 %   MCV 87.2  78.0 - 100.0 fL   MCH 31.1  26.0 - 34.0 pg   MCHC 35.6  30.0 - 36.0 g/dL   RDW 12.4  11.5 - 15.5 %   Platelets 285  150 - 400 K/uL  D-DIMER, QUANTITATIVE      Result Value Ref Range   D-Dimer, Quant 0.41  0.00 - 0.48 ug/mL-FEU  I-STAT TROPOININ, ED      Result Value Ref Range   Troponin i, poc 0.01  0.00 - 0.08 ng/mL   Comment 3            Imaging Review Dg Chest 2 View  03/13/2014   CLINICAL DATA:  CHEST PAIN  EXAM: CHEST  2 VIEW  COMPARISON:  DG CHEST 2 VIEW dated 06/27/2013  FINDINGS: Cardiomediastinal silhouette is unremarkable. The lungs are clear without pleural effusions or focal consolidations. Persistent mildly elevated right hemidiaphragm. Trachea projects midline and there is no pneumothorax. Soft tissue planes and included osseous structures are non-suspicious. Mild degenerative change of the thoracic spine.  IMPRESSION: No active cardiopulmonary disease.   Electronically Signed   By: Elon Alas   On: 03/13/2014 00:36    Date: 03/13/2014  Rate: 100  Rhythm: normal sinus rhythm  QRS Axis: right  Intervals: normal  ST/T Wave abnormalities: normal  Conduction  Disutrbances:none  Narrative Interpretation: Rightward axis, low voltage. When compared with ECG of 07/26/2012, no significant changes are seen.  Old EKG Reviewed: unchanged   MDM   Final diagnoses:  Chest pain    Chest pain of uncertain cause. Old records are reviewed and she did have a heart catheterization in August of 2013 which showed normal coronary arteries. Only abnormality was a slightly elevated left ventricular end-diastolic pressure. Given the fact that she had normal coronary arteries 2 years ago, likelihood of coronary artery disease being present today is very low. D-dimer will be obtained to rule out pulmonary embolism. She's given a GI cocktail which did give her significant relief of pain. She is currently taking pantoprazole and she is advised to increase it to twice a day for the next 5 days. She needs to followup with her PCP. If symptoms continue, and GI consultation should be considered.    Delora Fuel, MD 46/96/29 [id number]

## 2014-03-13 NOTE — Discharge Instructions (Signed)
Take your Protonix twice a day for the next five days.   Chest Pain (Nonspecific) It is often hard to give a specific diagnosis for the cause of chest pain. There is always a chance that your pain could be related to something serious, such as a heart attack or a blood clot in the lungs. You need to follow up with your caregiver for further evaluation. CAUSES   Heartburn.  Pneumonia or bronchitis.  Anxiety or stress.  Inflammation around your heart (pericarditis) or lung (pleuritis or pleurisy).  A blood clot in the lung.  A collapsed lung (pneumothorax). It can develop suddenly on its own (spontaneous pneumothorax) or from injury (trauma) to the chest.  Shingles infection (herpes zoster virus). The chest wall is composed of bones, muscles, and cartilage. Any of these can be the source of the pain.  The bones can be bruised by injury.  The muscles or cartilage can be strained by coughing or overwork.  The cartilage can be affected by inflammation and become sore (costochondritis). DIAGNOSIS  Lab tests or other studies, such as X-rays, electrocardiography, stress testing, or cardiac imaging, may be needed to find the cause of your pain.  TREATMENT   Treatment depends on what may be causing your chest pain. Treatment may include:  Acid blockers for heartburn.  Anti-inflammatory medicine.  Pain medicine for inflammatory conditions.  Antibiotics if an infection is present.  You may be advised to change lifestyle habits. This includes stopping smoking and avoiding alcohol, caffeine, and chocolate.  You may be advised to keep your head raised (elevated) when sleeping. This reduces the chance of acid going backward from your stomach into your esophagus.  Most of the time, nonspecific chest pain will improve within 2 to 3 days with rest and mild pain medicine. HOME CARE INSTRUCTIONS   If antibiotics were prescribed, take your antibiotics as directed. Finish them even if you  start to feel better.  For the next few days, avoid physical activities that bring on chest pain. Continue physical activities as directed.  Do not smoke.  Avoid drinking alcohol.  Only take over-the-counter or prescription medicine for pain, discomfort, or fever as directed by your caregiver.  Follow your caregiver's suggestions for further testing if your chest pain does not go away.  Keep any follow-up appointments you made. If you do not go to an appointment, you could develop lasting (chronic) problems with pain. If there is any problem keeping an appointment, you must call to reschedule. SEEK MEDICAL CARE IF:   You think you are having problems from the medicine you are taking. Read your medicine instructions carefully.  Your chest pain does not go away, even after treatment.  You develop a rash with blisters on your chest. SEEK IMMEDIATE MEDICAL CARE IF:   You have increased chest pain or pain that spreads to your arm, neck, jaw, back, or abdomen.  You develop shortness of breath, an increasing cough, or you are coughing up blood.  You have severe back or abdominal pain, feel nauseous, or vomit.  You develop severe weakness, fainting, or chills.  You have a fever. THIS IS AN EMERGENCY. Do not wait to see if the pain will go away. Get medical help at once. Call your local emergency services (911 in [place] Do not drive yourself to the hospital. MAKE SURE YOU:   Understand these instructions.  Will watch your condition.  Will get help right away if you are not doing well or get worse. Document  Released: 09/01/2005 Document Revised: 02/14/2012 Document Reviewed: 06/27/2008 [facility] Patient Information 2014 [place].

## 2014-03-13 NOTE — ED Notes (Signed)
All day left sided. cp. Took x 1 ntg. Sl. With some relief, but then the cp comes back. Been feeling faint, sluggish and weak.

## 2014-03-15 ENCOUNTER — Other Ambulatory Visit: Payer: Self-pay | Admitting: *Deleted

## 2014-03-15 NOTE — Telephone Encounter (Signed)
Request is for six month refill of nitrostat 0.4 mg tab.  Derl Barrow, RN

## 2014-03-19 MED ORDER — NITROGLYCERIN 0.4 MG SL SUBL: 0.4000 mg | SUBLINGUAL_TABLET | SUBLINGUAL | Status: DC | PRN

## 2014-03-21 LAB — HM PAP SMEAR: HM PAP: NEGATIVE

## 2014-03-22 ENCOUNTER — Ambulatory Visit (INDEPENDENT_AMBULATORY_CARE_PROVIDER_SITE_OTHER): Payer: Medicaid Other | Admitting: Family Medicine

## 2014-03-22 ENCOUNTER — Encounter: Payer: Self-pay | Admitting: Family Medicine

## 2014-03-22 ENCOUNTER — Ambulatory Visit (HOSPITAL_COMMUNITY)
Admission: RE | Admit: 2014-03-22 | Discharge: 2014-03-22 | Disposition: A | Discharge status: Home / Self Care | Payer: Medicaid Other | Source: Ambulatory Visit | Attending: Family Medicine | Admitting: Family Medicine

## 2014-03-22 VITALS — BP 125/87 | HR 116 | Temp 98.3°F | Ht 62.0 in | Wt 319.0 lb

## 2014-03-22 DIAGNOSIS — Z9889 Other specified postprocedural states: Secondary | ICD-10-CM

## 2014-03-22 DIAGNOSIS — R079 Chest pain, unspecified: Secondary | ICD-10-CM

## 2014-03-22 DIAGNOSIS — L0211 Cutaneous abscess of neck: Secondary | ICD-10-CM

## 2014-03-22 DIAGNOSIS — L03221 Cellulitis of neck: Secondary | ICD-10-CM

## 2014-03-22 DIAGNOSIS — IMO0001 Reserved for inherently not codable concepts without codable children: Secondary | ICD-10-CM

## 2014-03-22 DIAGNOSIS — E1165 Type 2 diabetes mellitus with hyperglycemia: Secondary | ICD-10-CM

## 2014-03-22 DIAGNOSIS — IMO0002 Reserved for concepts with insufficient information to code with codable children: Secondary | ICD-10-CM

## 2014-03-22 NOTE — Patient Instructions (Signed)
If you have any chest pain that does not go away within 30 minutes, is accompanied by nausea, sweating, shortness of breath, or made worse by activity, go to the emergency room immediately for evaluation. Likewise, seek care if you have any shortness of breath.  I am referring you to general surgery for your neck cyst. You will get a phone call to schedule this appointment.   The endocrinologist office phone number is: [phone] Please call them to reschedule the missed appointment.  Be well, Dr. [name]

## 2014-03-22 NOTE — Progress Notes (Signed)
Patient ID: [name], female   DOB: 1976/10/19, 39 y.o.   MRN: [id number]  HPI:  Chest pain: went to the ER about a week ago for chest pain. Was told to double up on protonix, which she's done. She's still had the same pain. It is in the center of her chest and goes up to her L shoulder. It's worse with exertion. She had it 3 days out of the last week. It lasts a few hours at a time. Took nitroglycerin and it didn't help. Denies associated shortness of breath, sweating, or nausea. Denies having chest pain actively right now. Denies swelling or palpitations. Has hx of tachycardia noted on past vitals, pt states no one has looked into this before. Does not smoke. Has several family members with heart disease (paternal grandmother, also paternal uncle had triple bypass surgery). Has scheduled appointment for June 3 with cardiology due to having been in the ER. Has had a cath in the past.  Neck pain: had an abscess surgically removed back in fall 2014. Was doing well after that, but recently for the past 2 weeks has had pain in her neck at the site of the surgery. The pain does not radiate to anywhere. Denies having any fevers, swelling in the area, or drainage. Does not know what sparked the pain to start 2 weeks ago. The pain is worse whenever she holds her head still in a single position and then goes to move. Takes chronic narcotics for knee pain and states these have not helped the neck pain. Has not tried applying ice or heat.   Diabetes: we previously referred her to endocrinology for her poorly controlled T2DM despite being on high levels of insulin. She was not able to keep her scheduled appointment because she did not have transportation. She requests the phone number of the doctor we referred her to, so that she can reschedule it.  ROS: See HPI  [place]: poorly controlled T2DM, chronic narcotic use, morbid obesity  PHYSICAL EXAM: BP 125/87  Pulse 116  Temp(Src) 98.3 F (36.8 C) (Oral)   Ht 5\' 2"  (1.575 m)  Wt 319 lb (144.697 kg)  BMI 58.33 kg/m2  LMP 03/14/2014 Gen: NAD HEENT: posterior neck with ~4cm oval shaped scar from resection of previous abscess. No erythema, warmth, induration, or fluctuance. No drainage or skin breakdown. The area is tender to palpation. Heart: RRR Lungs: CTAB, NWOB Abdomen: obese Neuro: grossly nonfocal, speech normal  ASSESSMENT/PLAN:  See problem based charting for additional assessment/plan.  FOLLOW UP: F/u with endocrinology for T2DM F/u with general surgery for neck cyst pain F/u with cardiology for chest pain F/u with me in 1.5 months for chronic pain syndrome.  [place]. [name], [place]

## 2014-03-24 NOTE — Assessment & Plan Note (Signed)
I looked up and gave pt the phone number for the endocrinology office, so that she can call and reschedule her new patient appointment there.

## 2014-03-24 NOTE — Assessment & Plan Note (Signed)
Pain overlying prior surgical site. No obvious infection. Will refer to general surgery to have them evaluate, and determine if additional surgical management is indicated.

## 2014-03-24 NOTE — Assessment & Plan Note (Signed)
Sx's concerning for possible angina. EKG today non-ischemic, just shows sinus tachycardia. Our office has called and gotten pt an appointment with cardiology for less than 1 week from now. Discussed reasons to go immediately to the ER with patient. Precepted with Dr. Lindell Noe who agrees with this plan.

## 2014-03-24 NOTE — Assessment & Plan Note (Signed)
>>  ASSESSMENT AND PLAN FOR INSULIN DEPENDENT TYPE 2 DIABETES MELLITUS ([name]) WRITTEN ON 03/24/2014  5:14 PM BY Deloise Marchant, [name], MD  I looked up and gave pt the phone number for the endocrinology office, so that she can call and reschedule her new patient appointment there.

## 2014-03-25 ENCOUNTER — Emergency Department (HOSPITAL_COMMUNITY)
Admission: EM | Admit: 2014-03-25 | Discharge: 2014-03-26 | Disposition: A | Discharge status: Home / Self Care | Payer: Medicaid Other | Attending: Emergency Medicine | Admitting: Emergency Medicine

## 2014-03-25 ENCOUNTER — Encounter (HOSPITAL_COMMUNITY): Payer: Self-pay | Admitting: Emergency Medicine

## 2014-03-25 DIAGNOSIS — J4489 Other specified chronic obstructive pulmonary disease: Secondary | ICD-10-CM | POA: Insufficient documentation

## 2014-03-25 DIAGNOSIS — Z872 Personal history of diseases of the skin and subcutaneous tissue: Secondary | ICD-10-CM | POA: Insufficient documentation

## 2014-03-25 DIAGNOSIS — E785 Hyperlipidemia, unspecified: Secondary | ICD-10-CM | POA: Insufficient documentation

## 2014-03-25 DIAGNOSIS — M545 Low back pain, unspecified: Secondary | ICD-10-CM | POA: Insufficient documentation

## 2014-03-25 DIAGNOSIS — IMO0002 Reserved for concepts with insufficient information to code with codable children: Secondary | ICD-10-CM | POA: Insufficient documentation

## 2014-03-25 DIAGNOSIS — Z794 Long term (current) use of insulin: Secondary | ICD-10-CM | POA: Insufficient documentation

## 2014-03-25 DIAGNOSIS — Z9889 Other specified postprocedural states: Secondary | ICD-10-CM | POA: Insufficient documentation

## 2014-03-25 DIAGNOSIS — J449 Chronic obstructive pulmonary disease, unspecified: Secondary | ICD-10-CM | POA: Insufficient documentation

## 2014-03-25 DIAGNOSIS — M62830 Muscle spasm of back: Secondary | ICD-10-CM

## 2014-03-25 DIAGNOSIS — M5380 Other specified dorsopathies, site unspecified: Secondary | ICD-10-CM | POA: Insufficient documentation

## 2014-03-25 DIAGNOSIS — I10 Essential (primary) hypertension: Secondary | ICD-10-CM | POA: Insufficient documentation

## 2014-03-25 DIAGNOSIS — Z862 Personal history of diseases of the blood and blood-forming organs and certain disorders involving the immune mechanism: Secondary | ICD-10-CM | POA: Insufficient documentation

## 2014-03-25 DIAGNOSIS — F319 Bipolar disorder, unspecified: Secondary | ICD-10-CM | POA: Insufficient documentation

## 2014-03-25 DIAGNOSIS — M129 Arthropathy, unspecified: Secondary | ICD-10-CM | POA: Insufficient documentation

## 2014-03-25 DIAGNOSIS — IMO0001 Reserved for inherently not codable concepts without codable children: Secondary | ICD-10-CM | POA: Insufficient documentation

## 2014-03-25 DIAGNOSIS — E1165 Type 2 diabetes mellitus with hyperglycemia: Secondary | ICD-10-CM

## 2014-03-25 DIAGNOSIS — Z79899 Other long term (current) drug therapy: Secondary | ICD-10-CM | POA: Insufficient documentation

## 2014-03-25 DIAGNOSIS — Z8669 Personal history of other diseases of the nervous system and sense organs: Secondary | ICD-10-CM | POA: Insufficient documentation

## 2014-03-25 DIAGNOSIS — K219 Gastro-esophageal reflux disease without esophagitis: Secondary | ICD-10-CM | POA: Insufficient documentation

## 2014-03-25 DIAGNOSIS — Z87891 Personal history of nicotine dependence: Secondary | ICD-10-CM | POA: Insufficient documentation

## 2014-03-25 MED ORDER — OXYCODONE-ACETAMINOPHEN 5-325 MG PO TABS
1.0000 | ORAL_TABLET | Freq: Once | ORAL | Status: AC
  Administered 2014-03-25: 1 via ORAL
  Filled 2014-03-25: qty 1

## 2014-03-25 MED ORDER — METHOCARBAMOL 500 MG PO TABS
500.0000 mg | ORAL_TABLET | Freq: Once | ORAL | Status: AC
  Administered 2014-03-25: 500 mg via ORAL
  Filled 2014-03-25: qty 1

## 2014-03-25 MED ORDER — KETOROLAC TROMETHAMINE 60 MG/2ML IM SOLN
60.0000 mg | Freq: Once | INTRAMUSCULAR | Status: AC
  Administered 2014-03-25: 60 mg via INTRAMUSCULAR
  Filled 2014-03-25: qty 2

## 2014-03-25 MED ORDER — METHOCARBAMOL 500 MG PO TABS
500.0000 mg | ORAL_TABLET | Freq: Two times a day (BID) | ORAL | Status: DC | PRN
Start: 2014-03-25 — End: 2014-03-27

## 2014-03-25 MED ORDER — MELOXICAM 7.5 MG PO TABS
15.0000 mg | ORAL_TABLET | Freq: Every day | ORAL | Status: DC
Start: 2014-03-25 — End: 2014-03-27

## 2014-03-25 NOTE — ED Provider Notes (Signed)
CSN: [id number]     Arrival date & time 03/25/14  2131 History  This chart was scribed for non-physician practitioner Noland Fordyce, PA-C working with Osvaldo Shipper, MD by Eston Mould, ED Scribe. This patient was seen in room TR05C/TR05C and the patient's care was started at 11:34 PM .    Chief Complaint  Patient presents with  . Back Pain   The history is provided by the patient. No language interpreter was used.   HPI Comments: [name] is a 38 y.o. female who presents to the Emergency Department complaining of ongoing lower back pain on her R side that began tonight. She states the back pain is sharp and constant that worsens with certain movements. Pt states her PCP prescribed her Gabapentin but denies any relief. She is currently taking Vicodin and Flexeril & has been applying ice/heat but denies any relief. She is allergic to Nubane. Pt denies upper back pain, weakness to bilateral lower extremities, injury, or recent falls.  Past Medical History  Diagnosis Date  . Morbid obesity   . Alveolar hypoventilation   . Asthma   . GERD (gastroesophageal reflux disease)   . Rectal fissure   . Hypertension   . Costochondritis   . Cellulitis 08/2010-08/2011  . Depression   . Bipolar 2 disorder   . Obstructive sleep apnea   . Chest pain     NO CAD by cath 5/62/13, nl LV systolic fxn  . Diabetes mellitus 2000    Type 2, Uncontrolled  . Arthritis   . HLD (hyperlipidemia)   . Anemia   . COPD (chronic obstructive pulmonary disease)    Past Surgical History  Procedure Laterality Date  . Carpal tunnel release    . Bil knee surgery    . Mass excision N/A 06/29/2013    Procedure:  WIDE LOCAL EXCISION OF POSTERIOR NECK ABSCESS;  Surgeon: Ralene Ok, MD;  Location: [facility];  Service: General;  Laterality: N/A;  . Cardiac catheterization     Family History  Problem Relation Age of Onset  . Diabetes Mother   . Hyperlipidemia Mother   . Depression Mother   .  Heart attack Paternal Uncle   . Heart disease Paternal Grandmother   . Heart attack Paternal Grandmother   . Heart attack Paternal Grandfather   . Heart disease Paternal Grandfather    History  Substance Use Topics  . Smoking status: Former Smoker -- 0.30 packs/day for .3 years    Types: Cigarettes    Quit date: 12/06/1993  . Smokeless tobacco: Never Used  . Alcohol Use: No   OB History   Grav Para Term Preterm Abortions TAB SAB Ect Mult Living                 Review of Systems  Musculoskeletal: Positive for back pain and myalgias.  Neurological: Negative for weakness.  Psychiatric/Behavioral: Negative for self-injury.      Allergies  Kiwi extract and Nubain  Home Medications   Prior to Admission medications   Medication Sig Start Date End Date Taking? Authorizing Provider  atorvastatin (LIPITOR) 20 MG tablet Take 20 mg by mouth daily.   Yes Historical Provider, MD  budesonide-formoterol (SYMBICORT) 160-4.5 MCG/ACT inhaler Inhale 2 puffs into the lungs 2 (two) times daily.   Yes Historical Provider, MD  carvedilol (COREG) 12.5 MG tablet Take 12.5 mg by mouth 2 (two) times daily with a meal.   Yes Historical Provider, MD  cetirizine (ZYRTEC) 10 MG tablet Take  10 mg by mouth daily as needed for allergies.    Yes Historical Provider, MD  cyclobenzaprine (FLEXERIL) 10 MG tablet Take 1 tablet (10 mg total) by mouth daily as needed for muscle spasms. Space refills 30 days apart 02/04/14  Yes [name], MD  exenatide (BYETTA) 5 MCG/0.02ML SOPN injection Inject 0.02 mLs (5 mcg total) into the skin 2 (two) times daily with a meal. 02/15/14  Yes [name], MD  HYDROcodone-acetaminophen (NORCO) 10-325 MG per tablet Take 1 tablet by mouth every 8 (eight) hours as needed for moderate pain. 02/04/14  Yes [name], MD  insulin aspart (NOVOLOG) 100 UNIT/ML injection Inject 60 Units into the skin 3 (three) times daily with meals. 03/12/14  Yes [name], MD   Insulin Glargine (LANTUS SOLOSTAR) 100 UNIT/ML SOPN Inject 115 Units into the skin 2 (two) times daily. 07/30/13  Yes [name], MD  lisinopril (PRINIVIL,ZESTRIL) 10 MG tablet Take 1 tablet (10 mg total) by mouth daily. 03/12/14  Yes [name], MD  metFORMIN (GLUCOPHAGE) 500 MG tablet TAKE 1 TABLET BY MOUTH TWICE DAILY WITH A MEAL 01/17/14  Yes [name], MD  nitroGLYCERIN (NITROSTAT) 0.4 MG SL tablet Place 1 tablet (0.4 mg total) under the tongue every 5 (five) minutes as needed for chest pain.   Yes [name], MD  nystatin (MYCOSTATIN) powder Apply 1 Units topically daily as needed. Applies to rash or irritation  under stomach 08/20/11  Yes Gregor Hams, MD  pantoprazole (PROTONIX) 40 MG tablet Take 40 mg by mouth 2 (two) times daily.    Yes Historical Provider, MD  PROAIR HFA 108 (90 BASE) MCG/ACT inhaler INHALE 2 PUFFS BY MOUTH EVERY 6 HOURS AS NEEDED FOR WHEEZING   Yes [name], MD  traMADol (ULTRAM) 50 MG tablet TAKE 1 TABLET BY MOUTH EVERY DAY AS NEEDED FOR PAIN   Yes [name], MD   BP 118/71  Pulse 105  Temp(Src) 97.7 F (36.5 C) (Oral)  Resp 18  Ht 5\' 2"  (1.575 m)  Wt 324 lb 9.6 oz (147.238 kg)  BMI 59.36 kg/m2  SpO2 99%  LMP 03/14/2014  Physical Exam  Nursing note and vitals reviewed. Constitutional: She is oriented to person, place, and time. She appears well-developed and well-nourished.  Morbidly obese.   HENT:  Head: Normocephalic and atraumatic.  Eyes: EOM are normal.  Neck: Normal range of motion.  Cardiovascular: Normal rate.   Pulmonary/Chest: Effort normal.  Musculoskeletal: Normal range of motion.  Point tenderness in lumbar musculature. No midline spinal tenderness. Full ROM to all 4 extremities.   Neurological: She is alert and oriented to person, place, and time.  Skin: Skin is warm and dry.  Psychiatric: She has a normal mood and affect. Her behavior is normal.    ED Course  Procedures DIAGNOSTIC  STUDIES: Oxygen Saturation is 96% on RA, normal by my interpretation.    COORDINATION OF CARE: 11:37 PM-Discussed treatment plan which includes advised pt to apply ice/heat and will discharge with Mobic & Robaxin. Advised pt to F/U with PCP. Pt agreed to plan.   Labs Review Labs Reviewed - No data to display  Imaging Review No results found.   EKG Interpretation None     MDM   Final diagnoses:  Low back pain  Muscle spasm of back    pt c/o low back pain, denies hx of fall or trauma. No red flag symptoms do not believe imaging  needed at this time. Will tx symptomatically. Rx: mobic and robaxin. F/u with PCP. Return precautions provided. Pt verbalized understanding and agreement with tx plan.   I personally performed the services described in this documentation, which was scribed in my presence. The recorded information has been reviewed and is accurate.    Noland Fordyce, PA-C 03/27/14 [phone]

## 2014-03-25 NOTE — ED Notes (Signed)
Pt reports right back/flank pain that started around 19:30 this evening. States she took three different pain meds with no relief. States she is on chronic pain mgmt for arthritis in both knees. Denies dysuria, vaginal discharge, odor, or injury to back.

## 2014-03-25 NOTE — ED Notes (Signed)
Pt states she has been having lower back pain on her right side that feels like a similar episode she had 2 months prior.  Pt states she has taken gabapentin, ultram, and flexeril tonight without relief.

## 2014-03-27 ENCOUNTER — Ambulatory Visit (INDEPENDENT_AMBULATORY_CARE_PROVIDER_SITE_OTHER): Payer: Medicaid Other | Admitting: Cardiovascular Disease

## 2014-03-27 ENCOUNTER — Encounter: Payer: Self-pay | Admitting: Cardiovascular Disease

## 2014-03-27 VITALS — BP 133/89 | HR 103 | Ht 62.0 in | Wt 326.2 lb

## 2014-03-27 DIAGNOSIS — R079 Chest pain, unspecified: Secondary | ICD-10-CM

## 2014-03-27 MED ORDER — IBUPROFEN 600 MG PO TABS: 600.0000 mg | ORAL_TABLET | Freq: Three times a day (TID) | ORAL | Status: DC | PRN

## 2014-03-27 NOTE — Progress Notes (Signed)
HPI:  38 year old woman presenting for cardiac followup evaluation. The patient has had recurrent chest pain and underwent cardiac catheterization in 2013 demonstrating normal coronary arteries. She had done reasonably well from a cardiac perspective until the last several weeks where she has developed a recurrence of left-sided chest discomfort. It is a "throbbing" type of pain. The pain radiates to the left shoulder. There is no associated shortness of breath, diaphoresis, nausea, or vomiting. She has been evaluated in the emergency department with normal cardiac enzymes. Her EKGs have been nonischemic. She presents today for followup evaluation.  Outpatient Encounter Prescriptions as of 03/27/2014  Medication Sig  . atorvastatin (LIPITOR) 20 MG tablet Take 20 mg by mouth daily.  . budesonide-formoterol (SYMBICORT) 160-4.5 MCG/ACT inhaler Inhale 2 puffs into the lungs 2 (two) times daily.  . carvedilol (COREG) 12.5 MG tablet Take 12.5 mg by mouth 2 (two) times daily with a meal.  . cetirizine (ZYRTEC) 10 MG tablet Take 10 mg by mouth daily as needed for allergies.   . cyclobenzaprine (FLEXERIL) 10 MG tablet Take 1 tablet (10 mg total) by mouth daily as needed for muscle spasms. Space refills 30 days apart  . exenatide (BYETTA) 5 MCG/0.02ML SOPN injection Inject 0.02 mLs (5 mcg total) into the skin 2 (two) times daily with a meal.  . HYDROcodone-acetaminophen (NORCO) 10-325 MG per tablet Take 1 tablet by mouth every 8 (eight) hours as needed for moderate pain.  Marland Kitchen insulin aspart (NOVOLOG) 100 UNIT/ML injection Inject 60 Units into the skin 3 (three) times daily with meals.  . Insulin Glargine (LANTUS SOLOSTAR) 100 UNIT/ML SOPN Inject 115 Units into the skin 2 (two) times daily.  Marland Kitchen lisinopril (PRINIVIL,ZESTRIL) 10 MG tablet Take 1 tablet (10 mg total) by mouth daily.  . metFORMIN (GLUCOPHAGE) 500 MG tablet TAKE 1 TABLET BY MOUTH TWICE DAILY WITH A MEAL  . nitroGLYCERIN (NITROSTAT) 0.4 MG SL tablet  Place 1 tablet (0.4 mg total) under the tongue every 5 (five) minutes as needed for chest pain.  Marland Kitchen nystatin (MYCOSTATIN) powder Apply 1 Units topically daily as needed. Applies to rash or irritation  under stomach  . pantoprazole (PROTONIX) 40 MG tablet Take 40 mg by mouth 2 (two) times daily.   Marland Kitchen PROAIR HFA 108 (90 BASE) MCG/ACT inhaler INHALE 2 PUFFS BY MOUTH EVERY 6 HOURS AS NEEDED FOR WHEEZING  . traMADol (ULTRAM) 50 MG tablet TAKE 1 TABLET BY MOUTH EVERY DAY AS NEEDED FOR PAIN  . [DISCONTINUED] meloxicam (MOBIC) 7.5 MG tablet Take 2 tablets (15 mg total) by mouth daily.  . [DISCONTINUED] methocarbamol (ROBAXIN) 500 MG tablet Take 1 tablet (500 mg total) by mouth 2 (two) times daily as needed for muscle spasms.    Allergies  Allergen Reactions  . Kiwi Extract Anaphylaxis  . Nubain [Nalbuphine Hcl] Other (See Comments)    Nervous...makes her feel like something is crawling on her.    Past Medical History  Diagnosis Date  . Morbid obesity   . Alveolar hypoventilation   . Asthma   . GERD (gastroesophageal reflux disease)   . Rectal fissure   . Hypertension   . Costochondritis   . Cellulitis 08/2010-08/2011  . Depression   . Bipolar 2 disorder   . Obstructive sleep apnea   . Chest pain     NO CAD by cath 6/38/75, nl LV systolic fxn  . Diabetes mellitus 2000    Type 2, Uncontrolled  . Arthritis   . HLD (hyperlipidemia)   .  Anemia   . COPD (chronic obstructive pulmonary disease)     ROS: Negative except as per HPI  BP 133/89  Pulse 103  Ht 5\' 2"  (1.575 m)  Wt 326 lb 3.2 oz (147.963 kg)  BMI 59.65 kg/m2  LMP 03/14/2014  PHYSICAL EXAM: Pt is alert and oriented, pleasant morbidly obese woman in NAD HEENT: normal Neck: JVP - normal, carotids 2+= without bruits Lungs: CTA bilaterally CV: RRR without murmur or gallop Abd: soft, obese Ext: Trace pretibial edema bilaterally, distal pulses intact and equal Skin: warm/dry no rash  EKG:  Recent tracing reviewed.  Demonstrates sinus tachycardia with no significant ST or T wave changes  Cardiac catheterization 07/27/2012: Procedural Findings:  Hemodynamics:  AO 145/103 with a mean of 122  LV 150/29  Coronary angiography:  Coronary dominance: right  Left mainstem: Widely patent with no obstructive coronary disease.  Left anterior descending (LAD): Patent to the left ventricular apex. There is no significant stenosis identified. There are 2 diagonals with no significant stenosis.  Left circumflex (LCx): The left circumflex is patent throughout. There were no irregularities noted. The vessel appears widely patent. The first OM is large without significant disease. The left circumflex is dominant and gives off a PLA and a PDA branch, both are patent.  Right coronary artery (RCA): The right coronary artery is nondominant. It gives off a single RV marginal branch without significant stenosis.  Left ventriculography: Left ventricular systolic function is normal, LVEF is estimated at 55-65%, there is no significant mitral regurgitation  Final Conclusions:  1. Widely patent coronary arteries  2. Normal LV systolic function  3. Elevated LVEDP  Recommendations: The patient likely has noncardiac chest pain. She does have elevated filling pressures and will require ongoing aggressive risk reduction with treatment of her hypertension, diabetes, and efforts at weight loss. She will be eligible for discharge later this morning.   ASSESSMENT AND PLAN: Chest pain, atypical. There is no relation to exertion. Pain occurs at rest and unpredictable manner. The patient's chest wall is not tender today, but I think the most likely etiology of her pain is costochondritis. She's had no improvement with a doubling of her proton pump inhibitor. Her pain was not responsive to sublingual nitroglycerin on 2 different occasions. I have recommended that she try ibuprofen 600 mg every 6-8 hours as needed for pain. Otherwise I do not think  further cardiac evaluation is indicated. I stressed the importance of weight loss as it pertains to her long-term health. Her body mass index is 59. She has lost a little bit of weight and is motivated to continue to lose more.  Sherren Mocha 03/27/2014 2:55 PM

## 2014-03-27 NOTE — Patient Instructions (Addendum)
Your physician has recommended you make the following change in your medication: Take Ibuprofen 600mg  three times a day as needed for pain, please try to limit the use of this medicine for long periods of time  Your physician recommends that you schedule a follow-up appointment as needed with Dr Burt Knack

## 2014-03-28 NOTE — ED Provider Notes (Signed)
Medical screening examination/treatment/procedure(s) were performed by non-physician practitioner and as supervising physician I was immediately available for consultation/collaboration.   EKG Interpretation None        Osvaldo Shipper, MD 03/28/14 1555

## 2014-03-29 ENCOUNTER — Telehealth: Payer: Self-pay | Admitting: Family Medicine

## 2014-03-29 DIAGNOSIS — Z319 Encounter for procreative management, unspecified: Secondary | ICD-10-CM

## 2014-03-29 NOTE — Telephone Encounter (Signed)
I received a faxed message from [place] that they would like pt referred to [facility]. Because she has Medicaid, I have to authorize this.   [place] red team, please call pt and let her know the following: - I will enter the referral - she needs to STOP taking her lisinopril and atorvastatin as these are teratogenic in pregnancy - she should f/u for a blood pressure check in the next few weeks while not on the lisinopril  Precepted with Dr. Mingo Amber who agrees with this plan.   [name], MD

## 2014-04-01 NOTE — Telephone Encounter (Signed)
Called pt. Informed. Pt reports that she does not want to go to the [facility] anymore, because she would have to pay. [name]

## 2014-04-01 NOTE — Telephone Encounter (Signed)
Cancelled referral. .[name]

## 2014-04-12 ENCOUNTER — Ambulatory Visit (INDEPENDENT_AMBULATORY_CARE_PROVIDER_SITE_OTHER): Payer: Medicaid Other | Admitting: Surgery

## 2014-04-12 ENCOUNTER — Other Ambulatory Visit: Payer: Self-pay | Admitting: Family Medicine

## 2014-04-13 ENCOUNTER — Emergency Department (HOSPITAL_COMMUNITY)
Admission: EM | Admit: 2014-04-13 | Discharge: 2014-04-14 | Disposition: A | Discharge status: Home / Self Care | Payer: Medicaid Other | Attending: Emergency Medicine | Admitting: Emergency Medicine

## 2014-04-13 ENCOUNTER — Emergency Department (HOSPITAL_COMMUNITY): Payer: Medicaid Other

## 2014-04-13 ENCOUNTER — Encounter (HOSPITAL_COMMUNITY): Payer: Self-pay | Admitting: Emergency Medicine

## 2014-04-13 DIAGNOSIS — E1065 Type 1 diabetes mellitus with hyperglycemia: Secondary | ICD-10-CM | POA: Insufficient documentation

## 2014-04-13 DIAGNOSIS — J449 Chronic obstructive pulmonary disease, unspecified: Secondary | ICD-10-CM | POA: Insufficient documentation

## 2014-04-13 DIAGNOSIS — IMO0002 Reserved for concepts with insufficient information to code with codable children: Secondary | ICD-10-CM | POA: Insufficient documentation

## 2014-04-13 DIAGNOSIS — Z794 Long term (current) use of insulin: Secondary | ICD-10-CM | POA: Insufficient documentation

## 2014-04-13 DIAGNOSIS — E785 Hyperlipidemia, unspecified: Secondary | ICD-10-CM | POA: Insufficient documentation

## 2014-04-13 DIAGNOSIS — S93601A Unspecified sprain of right foot, initial encounter: Secondary | ICD-10-CM

## 2014-04-13 DIAGNOSIS — M129 Arthropathy, unspecified: Secondary | ICD-10-CM | POA: Insufficient documentation

## 2014-04-13 DIAGNOSIS — W010XXA Fall on same level from slipping, tripping and stumbling without subsequent striking against object, initial encounter: Secondary | ICD-10-CM | POA: Insufficient documentation

## 2014-04-13 DIAGNOSIS — Y929 Unspecified place or not applicable: Secondary | ICD-10-CM | POA: Insufficient documentation

## 2014-04-13 DIAGNOSIS — Y939 Activity, unspecified: Secondary | ICD-10-CM | POA: Insufficient documentation

## 2014-04-13 DIAGNOSIS — S93609A Unspecified sprain of unspecified foot, initial encounter: Secondary | ICD-10-CM | POA: Insufficient documentation

## 2014-04-13 DIAGNOSIS — Z79899 Other long term (current) drug therapy: Secondary | ICD-10-CM | POA: Insufficient documentation

## 2014-04-13 DIAGNOSIS — X500XXA Overexertion from strenuous movement or load, initial encounter: Secondary | ICD-10-CM | POA: Insufficient documentation

## 2014-04-13 DIAGNOSIS — Z872 Personal history of diseases of the skin and subcutaneous tissue: Secondary | ICD-10-CM | POA: Insufficient documentation

## 2014-04-13 DIAGNOSIS — I10 Essential (primary) hypertension: Secondary | ICD-10-CM | POA: Insufficient documentation

## 2014-04-13 DIAGNOSIS — S139XXA Sprain of joints and ligaments of unspecified parts of neck, initial encounter: Secondary | ICD-10-CM | POA: Insufficient documentation

## 2014-04-13 DIAGNOSIS — Z8669 Personal history of other diseases of the nervous system and sense organs: Secondary | ICD-10-CM | POA: Insufficient documentation

## 2014-04-13 DIAGNOSIS — T148XXA Other injury of unspecified body region, initial encounter: Secondary | ICD-10-CM

## 2014-04-13 DIAGNOSIS — S161XXA Strain of muscle, fascia and tendon at neck level, initial encounter: Secondary | ICD-10-CM

## 2014-04-13 DIAGNOSIS — Z862 Personal history of diseases of the blood and blood-forming organs and certain disorders involving the immune mechanism: Secondary | ICD-10-CM | POA: Insufficient documentation

## 2014-04-13 DIAGNOSIS — F319 Bipolar disorder, unspecified: Secondary | ICD-10-CM | POA: Insufficient documentation

## 2014-04-13 DIAGNOSIS — J4489 Other specified chronic obstructive pulmonary disease: Secondary | ICD-10-CM | POA: Insufficient documentation

## 2014-04-13 DIAGNOSIS — Z9889 Other specified postprocedural states: Secondary | ICD-10-CM | POA: Insufficient documentation

## 2014-04-13 DIAGNOSIS — Z87891 Personal history of nicotine dependence: Secondary | ICD-10-CM | POA: Insufficient documentation

## 2014-04-13 DIAGNOSIS — K219 Gastro-esophageal reflux disease without esophagitis: Secondary | ICD-10-CM | POA: Insufficient documentation

## 2014-04-13 MED ORDER — OXYCODONE-ACETAMINOPHEN 5-325 MG PO TABS
2.0000 | ORAL_TABLET | Freq: Once | ORAL | Status: AC
  Administered 2014-04-13: 2 via ORAL
  Filled 2014-04-13: qty 2

## 2014-04-13 NOTE — ED Provider Notes (Signed)
CSN: [id number]     Arrival date & time 04/13/14  2144 History   First MD Initiated Contact with Patient 04/13/14 2155     Chief Complaint  Patient presents with  . Foot Pain    right   . Fall     (Consider location/radiation/quality/duration/timing/severity/associated sxs/prior Treatment) The history is provided by the patient and medical records. No language interpreter was used.    [name] is a 38 y.o. female  with a hx of morbid obesity, HTN, GERD, asthma, IDDM, arthritis, chronic pain presents to the Emergency Department complaining of acute, persistent right foot pain and neck pain after slipping and falling in the mud onset 80min PTA.  Pt reports her foot folded back underneath her and she fell.  She denies striking her head or LOC.  Pt reports she takes hydrocodone at home for chronic pain, but did not attempt any PTA.  Pt reports she was ambulatory after the fall without difficulty and without pain in her right hip, knee or ankle.  She reports pain is worst on the dorsum of the foot and she has an associated abrasion to the site.  Movement and palpation makes it worse and nothing makes it better.   Pt denies fever, chills, headache, back pain, weakness, numbness, syncope.     Past Medical History  Diagnosis Date  . Morbid obesity   . Alveolar hypoventilation   . Asthma   . GERD (gastroesophageal reflux disease)   . Rectal fissure   . Hypertension   . Costochondritis   . Cellulitis 08/2010-08/2011  . Depression   . Bipolar 2 disorder   . Obstructive sleep apnea   . Chest pain     NO CAD by cath 9/89/21, nl LV systolic fxn  . Diabetes mellitus 2000    Type 2, Uncontrolled  . Arthritis   . HLD (hyperlipidemia)   . Anemia   . COPD (chronic obstructive pulmonary disease)    Past Surgical History  Procedure Laterality Date  . Carpal tunnel release    . Bil knee surgery    . Mass excision N/A 06/29/2013    Procedure:  WIDE LOCAL EXCISION OF POSTERIOR NECK  ABSCESS;  Surgeon: Ralene Ok, MD;  Location: [facility];  Service: General;  Laterality: N/A;  . Cardiac catheterization     Family History  Problem Relation Age of Onset  . Diabetes Mother   . Hyperlipidemia Mother   . Depression Mother   . Heart attack Paternal Uncle   . Heart disease Paternal Grandmother   . Heart attack Paternal Grandmother   . Heart attack Paternal Grandfather   . Heart disease Paternal Grandfather    History  Substance Use Topics  . Smoking status: Former Smoker -- 0.30 packs/day for .3 years    Types: Cigarettes    Quit date: 12/06/1993  . Smokeless tobacco: Never Used  . Alcohol Use: No   OB History   Grav Para Term Preterm Abortions TAB SAB Ect Mult Living                 Review of Systems  Constitutional: Negative for fever and chills.  HENT: Negative for dental problem, facial swelling and nosebleeds.   Eyes: Negative for visual disturbance.  Respiratory: Negative for cough, chest tightness, shortness of breath, wheezing and stridor.   Cardiovascular: Negative for chest pain.  Gastrointestinal: Negative for nausea, vomiting and abdominal pain.  Genitourinary: Negative for dysuria, hematuria and flank pain.  Musculoskeletal: Positive for  arthralgias (right foot), gait problem (2/2 pain) and neck pain. Negative for back pain, joint swelling and neck stiffness.  Skin: Positive for wound (abrasion, right foot). Negative for rash.  Neurological: Negative for syncope, weakness, light-headedness, numbness and headaches.  Hematological: Does not bruise/bleed easily.  Psychiatric/Behavioral: The patient is not nervous/anxious.   All other systems reviewed and are negative.     Allergies  Kiwi extract and Nubain  Home Medications   Prior to Admission medications   Medication Sig Start Date End Date Taking? Authorizing Provider  atorvastatin (LIPITOR) 20 MG tablet Take 20 mg by mouth daily.    Historical Provider, MD  budesonide-formoterol  (SYMBICORT) 160-4.5 MCG/ACT inhaler Inhale 2 puffs into the lungs 2 (two) times daily.    Historical Provider, MD  carvedilol (COREG) 12.5 MG tablet Take 12.5 mg by mouth 2 (two) times daily with a meal.    Historical Provider, MD  cetirizine (ZYRTEC) 10 MG tablet Take 10 mg by mouth daily as needed for allergies.     Historical Provider, MD  cyclobenzaprine (FLEXERIL) 10 MG tablet Take 1 tablet (10 mg total) by mouth daily as needed for muscle spasms. Space refills 30 days apart 02/04/14   [name], MD  exenatide (BYETTA) 5 MCG/0.02ML SOPN injection Inject 0.02 mLs (5 mcg total) into the skin 2 (two) times daily with a meal. 02/15/14   [name], MD  HYDROcodone-acetaminophen (NORCO) 10-325 MG per tablet Take 1 tablet by mouth every 8 (eight) hours as needed for moderate pain. 02/04/14   [name], MD  ibuprofen (ADVIL,MOTRIN) 600 MG tablet Take 1 tablet (600 mg total) by mouth every 8 (eight) hours as needed. 03/27/14   Sherren Mocha, MD  insulin aspart (NOVOLOG) 100 UNIT/ML injection Inject 60 Units into the skin 3 (three) times daily with meals. 03/12/14   [name], MD  Insulin Glargine (LANTUS SOLOSTAR) 100 UNIT/ML SOPN Inject 115 Units into the skin 2 (two) times daily. 07/30/13   [name], MD  lisinopril (PRINIVIL,ZESTRIL) 10 MG tablet Take 1 tablet (10 mg total) by mouth daily. 03/12/14   [name], MD  metFORMIN (GLUCOPHAGE) 500 MG tablet TAKE 1 TABLET BY MOUTH TWICE DAILY WITH A MEAL 01/17/14   [name], MD  nitroGLYCERIN (NITROSTAT) 0.4 MG SL tablet Place 1 tablet (0.4 mg total) under the tongue every 5 (five) minutes as needed for chest pain.    [name], MD  nystatin (MYCOSTATIN) powder Apply 1 Units topically daily as needed. Applies to rash or irritation  under stomach 08/20/11   Gregor Hams, MD  pantoprazole (PROTONIX) 40 MG tablet Take 40 mg by mouth 2 (two) times daily.     Historical Provider, MD  PROAIR HFA  108 (90 BASE) MCG/ACT inhaler INHALE 2 PUFFS BY MOUTH EVERY 6 HOURS AS NEEDED FOR WHEEZING    [name], MD  traMADol (ULTRAM) 50 MG tablet TAKE 1 TABLET BY MOUTH EVERY DAY AS NEEDED FOR PAIN    [name], MD   BP 128/80  Pulse 116  Temp(Src) 98 F (36.7 C) (Oral)  Resp 18  SpO2 95%  LMP 03/14/2014 Physical Exam  Nursing note and vitals reviewed. Constitutional: She is oriented to person, place, and time. She appears well-developed and well-nourished. No distress.  HENT:  Head: Normocephalic and atraumatic.  Mouth/Throat: Oropharynx is clear and moist. No oropharyngeal exudate.  Eyes: Conjunctivae are normal.  Neck: Normal range of motion. Neck  supple. Muscular tenderness present. No spinous process tenderness present. No rigidity. Normal range of motion present.  Full ROM without difficulty Pain to palpation of the bilateral paraspinal muscles, close to the midline Large buffalo hump  Cardiovascular: Normal rate, regular rhythm, normal heart sounds and intact distal pulses.   No murmur heard. Pulmonary/Chest: Effort normal and breath sounds normal. No respiratory distress. She has no wheezes.  Abdominal: Soft. She exhibits no distension. There is no tenderness.  Musculoskeletal:  Full range of motion of the T-spine and L-spine No tenderness to palpation of the spinous processes of the T-spine or L-spine No tenderness to palpation of the paraspinous muscles of the L-spine Full ROM of the right hip, knee and ankle without pain Pain to palpation of the dorsum of the right foot and great toe - pt refuses to ROM her toes due to pain  Lymphadenopathy:    She has no cervical adenopathy.  Neurological: She is alert and oriented to person, place, and time. She has normal reflexes. She exhibits normal muscle tone. Coordination normal.  Speech is clear and goal oriented, follows commands Normal 5/5 strength in upper and lower extremities bilaterally including dorsiflexion  and plantar flexion, strong and equal grip strength Sensation normal to light and sharp touch Moves extremities without ataxia, coordination intact Pt ambulates with pain in the right forefoot  Skin: Skin is warm and dry. No rash noted. She is not diaphoretic. No erythema.  3x3cm abrasion to the dorsum of the right foot; no laceration, no erythema, induration or abscess  Psychiatric: She has a normal mood and affect. Her behavior is normal.    ED Course  Procedures (including critical care time) Labs Review Labs Reviewed - No data to display  Imaging Review Dg Cervical Spine Complete  04/13/2014   CLINICAL DATA:  Fall with pain.  EXAM: CERVICAL SPINE  4+ VIEWS  COMPARISON:  10/03/2005 cervical spine CT  FINDINGS: Despite swimmer's view positioning, the C7 and C6 vertebral bodies are obscured laterally. Otherwise, there is no evidence of acute fracture or subluxation. There is prevertebral thickening, but stable from 2006. No significant degenerative changes.  IMPRESSION: Negative exam from the level of C1-C5. Nondiagnostic evaluation of C6 and C7 due to patient shape.   Electronically Signed   By: Jorje Guild M.D.   On: 04/13/2014 23:35   Dg Foot Complete Right  04/13/2014   CLINICAL DATA:  Foot pain.  EXAM: RIGHT FOOT COMPLETE - 3+ VIEW  COMPARISON:  DG FOOT COMPLETE*R* dated 01/01/2009; DG ANKLE COMPLETE*R* dated 01/01/2009  FINDINGS: Diabetic type small vessel atherosclerotic calcification. There is no fracture. The alignment of the bones of the right foot is within normal limits. Calcification is present in the soft tissues of the distal leg, although nonspecific this can be associated with chronic venous insufficiency, dermatomyositis or scleroderma among other causes. Old well corticated avulsion fracture fragment adjacent to the medial malleolus. Calcaneal spurs are incidentally noted. Loose body in the posterior ankle joint measuring 8 millimeters. There appears to be other loose body in  the anterior ankle joint, measuring 6 millimeters.  IMPRESSION: No acute osseous abnormality. Old posttraumatic changes of the ankle.   Electronically Signed   By: Dereck Ligas M.D.   On: 04/13/2014 23:37     EKG Interpretation None      MDM   Final diagnoses:  Right foot sprain  Abrasion  Cervical strain   Chales Abrahams presents with mild neck pain and right forefoot pain after  falling this evening. Pt given pain control in the department and will obtain x-rays.  No neurologic deficits on exam.  Pt ambulates with pain in the forefoot, but no difficulty with ankle, knee or hip.   12:18 AM Patient X-Ray negative for obvious fracture or dislocation. I personally reviewed the imaging tests through PACS system.  I reviewed available ER/hospitalization records through the EMR.  Pain managed in ED. Pt advised to follow up with orthopedics if symptoms persist for possibility of missed fracture diagnosis.  Patient is too obese for crutches, conservative therapy recommended and discussed. Discussed careful wound care at length due to her comorbidity of IDDM.  Patient to followup with her primary care physician in 3 days for a wound check and reevaluation of her pain. Patient to return to the emergency department if she develops fevers, redness, increased swelling. Patient will be dc home & is agreeable with above plan.  It has been determined that no acute conditions requiring further emergency intervention are present at this time. The patient/guardian have been advised of the diagnosis and plan. We have discussed signs and symptoms that warrant return to the ED, such as changes or worsening in symptoms. Patient/guardian has voiced understanding and agreed to follow-up with the PCP or specialist.       Abigail Butts, PA-C 04/14/14 2952

## 2014-04-13 NOTE — ED Notes (Signed)
Patient is from home. Patient slide in mud at party and hurt her right foot. Small abrasion noted on top of foot. Patient states pain is 10/10 with limited ROM.

## 2014-04-14 NOTE — ED Provider Notes (Signed)
Medical screening examination/treatment/procedure(s) were performed by non-physician practitioner and as supervising physician I was immediately available for consultation/collaboration.   EKG Interpretation None       Orlie Dakin, MD 04/14/14 2339

## 2014-04-14 NOTE — Discharge Instructions (Signed)
1. Medications: usual home medications 2. Treatment: rest, drink plenty of fluids, ice, elevate, rest 3. Follow Up: Please followup with your primary doctor for discussion of your diagnoses and further evaluation after today's visit;   Abrasion An abrasion is a cut or scrape of the skin. Abrasions do not extend through all layers of the skin and most heal within 10 days. It is important to care for your abrasion properly to prevent infection. CAUSES  Most abrasions are caused by falling on, or gliding across, the ground or other surface. When your skin rubs on something, the outer and inner layer of skin rubs off, causing an abrasion. DIAGNOSIS  Your caregiver will be able to diagnose an abrasion during a physical exam.  TREATMENT  Your treatment depends on how large and deep the abrasion is. Generally, your abrasion will be cleaned with water and a mild soap to remove any dirt or debris. An antibiotic ointment may be put over the abrasion to prevent an infection. A bandage (dressing) may be wrapped around the abrasion to keep it from getting dirty.  You may need a tetanus shot if:  You cannot remember when you had your last tetanus shot.  You have never had a tetanus shot.  The injury broke your skin. If you get a tetanus shot, your arm may swell, get red, and feel warm to the touch. This is common and not a problem. If you need a tetanus shot and you choose not to have one, there is a rare chance of getting tetanus. Sickness from tetanus can be serious.  HOME CARE INSTRUCTIONS   If a dressing was applied, change it at least once a day or as directed by your caregiver. If the bandage sticks, soak it off with warm water.   Wash the area with water and a mild soap to remove all the ointment 2 times a day. Rinse off the soap and pat the area dry with a clean towel.   Reapply any ointment as directed by your caregiver. This will help prevent infection and keep the bandage from sticking. Use  gauze over the wound and under the dressing to help keep the bandage from sticking.   Change your dressing right away if it becomes wet or dirty.   Only take over-the-counter or prescription medicines for pain, discomfort, or fever as directed by your caregiver.   Follow up with your caregiver within 24 48 hours for a wound check, or as directed. If you were not given a wound-check appointment, look closely at your abrasion for redness, swelling, or pus. These are signs of infection. SEEK IMMEDIATE MEDICAL CARE IF:   You have increasing pain in the wound.   You have redness, swelling, or tenderness around the wound.   You have pus coming from the wound.   You have a fever or persistent symptoms for more than 2 3 days.  You have a fever and your symptoms suddenly get worse.  You have a bad smell coming from the wound or dressing.  MAKE SURE YOU:   Understand these instructions.  Will watch your condition.  Will get help right away if you are not doing well or get worse. Document Released: 09/01/2005 Document Revised: 11/08/2012 Document Reviewed: 10/26/2011 [facility] Patient Information 2014 [place], [place].    Foot Sprain The muscles and cord like structures which attach muscle to bone (tendons) that surround the feet are made up of units. A foot sprain can occur at the weakest spot in  any of these units. This condition is most often caused by injury to or overuse of the foot, as from playing contact sports, or aggravating a previous injury, or from poor conditioning, or obesity. SYMPTOMS  Pain with movement of the foot.  Tenderness and swelling at the injury site.  Loss of strength is present in moderate or severe sprains. THE THREE GRADES OR SEVERITY OF FOOT SPRAIN ARE:  Mild (Grade I): Slightly pulled muscle without tearing of muscle or tendon fibers or loss of strength.  Moderate (Grade II): Tearing of fibers in a muscle, tendon, or at the attachment to bone,  with small decrease in strength.  Severe (Grade III): Rupture of the muscle-tendon-bone attachment, with separation of fibers. Severe sprain requires surgical repair. Often repeating (chronic) sprains are caused by overuse. Sudden (acute) sprains are caused by direct injury or over-use. DIAGNOSIS  Diagnosis of this condition is usually by your own observation. If problems continue, a caregiver may be required for further evaluation and treatment. X-rays may be required to make sure there are not breaks in the bones (fractures) present. Continued problems may require physical therapy for treatment. PREVENTION  Use strength and conditioning exercises appropriate for your sport.  Warm up properly prior to working out.  Use athletic shoes that are made for the sport you are participating in.  Allow adequate time for healing. Early return to activities makes repeat injury more likely, and can lead to an unstable arthritic foot that can result in prolonged disability. Mild sprains generally heal in 3 to 10 days, with moderate and severe sprains taking 2 to 10 weeks. Your caregiver can help you determine the proper time required for healing. HOME CARE INSTRUCTIONS   Apply ice to the injury for 15-20 minutes, 03-04 times per day. Put the ice in a plastic bag and place a towel between the bag of ice and your skin.  An elastic wrap (like an Ace bandage) may be used to keep swelling down.  Keep foot above the level of the heart, or at least raised on a footstool, when swelling and pain are present.  Try to avoid use other than gentle range of motion while the foot is painful. Do not resume use until instructed by your caregiver. Then begin use gradually, not increasing use to the point of pain. If pain does develop, decrease use and continue the above measures, gradually increasing activities that do not cause discomfort, until you gradually achieve normal use.  Use crutches if and as instructed, and  for the length of time instructed.  Keep injured foot and ankle wrapped between treatments.  Massage foot and ankle for comfort and to keep swelling down. Massage from the toes up towards the knee.  Only take over-the-counter or prescription medicines for pain, discomfort, or fever as directed by your caregiver. SEEK IMMEDIATE MEDICAL CARE IF:   Your pain and swelling increase, or pain is not controlled with medications.  You have loss of feeling in your foot or your foot turns cold or blue.  You develop new, unexplained symptoms, or an increase of the symptoms that brought you to your caregiver. MAKE SURE YOU:   Understand these instructions.  Will watch your condition.  Will get help right away if you are not doing well or get worse. Document Released: 05/14/2002 Document Revised: 02/14/2012 Document Reviewed: 07/11/2008 [facility] Patient Information 2014 [place], [place].   Cervical Sprain A cervical sprain is an injury in the neck in which the strong, fibrous tissues (  ligaments) that connect your neck bones stretch or tear. Cervical sprains can range from mild to severe. Severe cervical sprains can cause the neck vertebrae to be unstable. This can lead to damage of the spinal cord and can result in serious nervous system problems. The amount of time it takes for a cervical sprain to get better depends on the cause and extent of the injury. Most cervical sprains heal in 1 to 3 weeks. CAUSES  Severe cervical sprains may be caused by:   Contact sport injuries (such as from football, rugby, wrestling, hockey, auto racing, gymnastics, diving, martial arts, or boxing).   Motor vehicle collisions.   Whiplash injuries. This is an injury from a sudden forward-and backward whipping movement of the head and neck.  Falls.  Mild cervical sprains may be caused by:   Being in an awkward position, such as while cradling a telephone between your ear and shoulder.   Sitting in a chair  that does not offer proper support.   Working at a poorly Landscape architect station.   Looking up or down for long periods of time.  SYMPTOMS   Pain, soreness, stiffness, or a burning sensation in the front, back, or sides of the neck. This discomfort may develop immediately after the injury or slowly, 24 hours or more after the injury.   Pain or tenderness directly in the middle of the back of the neck.   Shoulder or upper back pain.   Limited ability to move the neck.   Headache.   Dizziness.   Weakness, numbness, or tingling in the hands or arms.   Muscle spasms.   Difficulty swallowing or chewing.   Tenderness and swelling of the neck.  DIAGNOSIS  Most of the time your health care provider can diagnose a cervical sprain by taking your history and doing a physical exam. Your health care provider will ask about previous neck injuries and any known neck problems, such as arthritis in the neck. X-rays may be taken to find out if there are any other problems, such as with the bones of the neck. Other tests, such as a CT scan or MRI, may also be needed.  TREATMENT  Treatment depends on the severity of the cervical sprain. Mild sprains can be treated with rest, keeping the neck in place (immobilization), and pain medicines. Severe cervical sprains are immediately immobilized. Further treatment is done to help with pain, muscle spasms, and other symptoms and may include:  Medicines, such as pain relievers, numbing medicines, or muscle relaxants.   Physical therapy. This may involve stretching exercises, strengthening exercises, and posture training. Exercises and improved posture can help stabilize the neck, strengthen muscles, and help stop symptoms from returning.  HOME CARE INSTRUCTIONS   Put ice on the injured area.   Put ice in a plastic bag.   Place a towel between your skin and the bag.   Leave the ice on for 15 20 minutes, 3 4 times a day.   If your  injury was severe, you may have been given a cervical collar to wear. A cervical collar is a two-piece collar designed to keep your neck from moving while it heals.  Do not remove the collar unless instructed by your health care provider.  If you have long hair, keep it outside of the collar.  Ask your health care provider before making any adjustments to your collar. Minor adjustments may be required over time to improve comfort and reduce pressure on your chin  or on the back of your head.  Ifyou are allowed to remove the collar for cleaning or bathing, follow your health care provider's instructions on how to do so safely.  Keep your collar clean by wiping it with mild soap and water and drying it completely. If the collar you have been given includes removable pads, remove them every 1 2 days and hand wash them with soap and water. Allow them to air dry. They should be completely dry before you wear them in the collar.  If you are allowed to remove the collar for cleaning and bathing, wash and dry the skin of your neck. Check your skin for irritation or sores. If you see any, tell your health care provider.  Do not drive while wearing the collar.   Only take over-the-counter or prescription medicines for pain, discomfort, or fever as directed by your health care provider.   Keep all follow-up appointments as directed by your health care provider.   Keep all physical therapy appointments as directed by your health care provider.   Make any needed adjustments to your workstation to promote good posture.   Avoid positions and activities that make your symptoms worse.   Warm up and stretch before being active to help prevent problems.  SEEK MEDICAL CARE IF:   Your pain is not controlled with medicine.   You are unable to decrease your pain medicine over time as planned.   Your activity level is not improving as expected.  SEEK IMMEDIATE MEDICAL CARE IF:   You develop any  bleeding.  You develop stomach upset.  You have signs of an allergic reaction to your medicine.   Your symptoms get worse.   You develop new, unexplained symptoms.   You have numbness, tingling, weakness, or paralysis in any part of your body.  MAKE SURE YOU:   Understand these instructions.  Will watch your condition.  Will get help right away if you are not doing well or get worse. Document Released: 09/19/2007 Document Revised: 09/12/2013 Document Reviewed: 05/30/2013 [facility] Patient Information 2014 [place].

## 2014-04-16 ENCOUNTER — Ambulatory Visit (INDEPENDENT_AMBULATORY_CARE_PROVIDER_SITE_OTHER): Payer: Medicaid Other | Admitting: Family Medicine

## 2014-04-16 ENCOUNTER — Encounter: Payer: Self-pay | Admitting: Family Medicine

## 2014-04-16 VITALS — BP 139/92 | HR 109 | Temp 97.8°F | Ht 62.0 in | Wt 323.0 lb

## 2014-04-16 DIAGNOSIS — S93601A Unspecified sprain of right foot, initial encounter: Secondary | ICD-10-CM | POA: Insufficient documentation

## 2014-04-16 DIAGNOSIS — S93609A Unspecified sprain of unspecified foot, initial encounter: Secondary | ICD-10-CM

## 2014-04-16 NOTE — Assessment & Plan Note (Signed)
I am sorry about your foot sprain.   Continue to ice every 4 hrs. Elevate above as much as possible even at night. Current pain regimen. Wrap in the morning to prevent swelling.

## 2014-04-16 NOTE — Progress Notes (Signed)
   Subjective:    Patient ID: [name], female    DOB: 1976/04/22, 38 y.o.   MRN: [id number] CC: rt foot pain  HPI 38 yo F presents for SD visit:  1. Rt foot pain: patient slipped on mud 3 days ago. She was wearing flip flops. Her R foot was hyperextended under her body. She went to the ED. X-rays normal. Was informed that she had a sprain. Dorsal foot pain is worsening. Pain is associated with bruising and swelling. She has Vicodin, tramadol, flexeril, advil, tylenol w/o relief. She is icing, elevating most of the time and compressing foot.   2. Request for handicap placcard: due to obesity related MSK pain she is unable to walk long distances w/o stopping. On narcotics to aid with pain while walking with goal of weight loss.   Soc hx: ex-smoker  Review of Systems As per HPI     Objective:   Physical Exam BP 139/92  Pulse 109  Temp(Src) 97.8 F (36.6 C) (Oral)  Ht 5\' 2"  (1.575 m)  Wt 323 lb (146.512 kg)  BMI 59.06 kg/m2  LMP 03/14/2014 General appearance: alert, cooperative, no distress and morbidly obese Extremities: R dorsal foot: full ROM at ankle. Bruising and swelling on dorsum of foot with abrasion that is 1.5 cm x 1 cm. 2+ DP pulse. Brisk capillary refill. Tender to palpation.    Wrapped R foot with ace bandage after apply bacitracin to abrasion and covering with 1x1 inch guaze.     Assessment & Plan:   Filled out handicap form.

## 2014-04-16 NOTE — Patient Instructions (Addendum)
Mrs. [name],  Thank you for coming in today. I am sorry about your foot sprain.   Continue to ice every 4 hrs. Elevate above as much as possible even at night. Current pain regimen. Wrap in the morning to prevent swelling.  Dr. Adrian Blackwater

## 2014-04-24 ENCOUNTER — Encounter (INDEPENDENT_AMBULATORY_CARE_PROVIDER_SITE_OTHER): Payer: Self-pay | Admitting: General Surgery

## 2014-04-24 ENCOUNTER — Ambulatory Visit (INDEPENDENT_AMBULATORY_CARE_PROVIDER_SITE_OTHER): Payer: Medicaid Other | Admitting: General Surgery

## 2014-04-24 VITALS — BP 126/80 | HR 114 | Temp 97.5°F | Ht 62.0 in | Wt 315.0 lb

## 2014-04-24 DIAGNOSIS — M542 Cervicalgia: Secondary | ICD-10-CM

## 2014-04-24 NOTE — Progress Notes (Signed)
Subjective:     Patient ID: [name], female   DOB: [birth year], 38 y.o.   MRN: [id number]  HPI The patient is a 38 year old female is referred by Dr. [name] for evaluation of neck pain over previous excision of I&D of neck abscess. This states she's had pain in her cervical spine area superior to scar tissue.  Patient states that this is sometimes positional. She says that palpating described he does not cause pain.  Review of Systems  Constitutional: Negative.   HENT: Negative.   Respiratory: Negative.   Cardiovascular: Negative.   Gastrointestinal: Negative.   Neurological: Negative.   All other systems reviewed and are negative.      Objective:   Physical Exam  Constitutional: She is oriented to person, place, and time. She appears well-developed and well-nourished.  HENT:  Head: Normocephalic and atraumatic.  Eyes: Conjunctivae and EOM are normal. Pupils are equal, round, and reactive to light.  Neck: Normal range of motion. Neck supple.  Cardiovascular: Normal rate, regular rhythm and normal heart sounds.   Pulmonary/Chest: Effort normal and breath sounds normal.  Musculoskeletal: Normal range of motion.  Neurological: She is alert and oriented to person, place, and time.  Skin: Skin is warm and dry.     Psychiatric: She has a normal mood and affect.       Assessment:     38 year old female status post I&D of posterior neck abscess. Patient's pain couldn't initially be related to muscle skeletal issue    Plan:     1. I do not believe any surgical removal of scar tissue would make this better. 2. At this time I would probably recommend the patient be worked up for a possible MSK issues because her pain.

## 2014-04-25 ENCOUNTER — Ambulatory Visit: Payer: Medicaid Other

## 2014-05-03 ENCOUNTER — Other Ambulatory Visit: Payer: Self-pay | Admitting: Family Medicine

## 2014-05-04 ENCOUNTER — Emergency Department (INDEPENDENT_AMBULATORY_CARE_PROVIDER_SITE_OTHER)
Admission: EM | Admit: 2014-05-04 | Discharge: 2014-05-04 | Disposition: A | Discharge status: Home / Self Care | Payer: Medicaid Other | Source: Home / Self Care | Attending: Family Medicine | Admitting: Family Medicine

## 2014-05-04 ENCOUNTER — Encounter (HOSPITAL_COMMUNITY): Payer: Self-pay | Admitting: Emergency Medicine

## 2014-05-04 DIAGNOSIS — L03115 Cellulitis of right lower limb: Secondary | ICD-10-CM

## 2014-05-04 DIAGNOSIS — L02619 Cutaneous abscess of unspecified foot: Secondary | ICD-10-CM

## 2014-05-04 DIAGNOSIS — L03119 Cellulitis of unspecified part of limb: Secondary | ICD-10-CM

## 2014-05-04 MED ORDER — TETANUS-DIPHTH-ACELL PERTUSSIS 5-2.5-18.5 LF-MCG/0.5 IM SUSP
0.5000 mL | Freq: Once | INTRAMUSCULAR | Status: AC
  Administered 2014-05-04: 0.5 mL via INTRAMUSCULAR

## 2014-05-04 MED ORDER — CEPHALEXIN 500 MG PO CAPS: 500.0000 mg | ORAL_CAPSULE | Freq: Three times a day (TID) | ORAL | Status: DC

## 2014-05-04 MED ORDER — TETANUS-DIPHTH-ACELL PERTUSSIS 5-2.5-18.5 LF-MCG/0.5 IM SUSP
INTRAMUSCULAR | Status: AC
  Filled 2014-05-04: qty 0.5

## 2014-05-04 NOTE — ED Notes (Signed)
Pt c/o abrasion to top of right foot onset 3 weeks Seen at [facility] ER and by PCP Believes it maybe infected Sx include pain, swelling and redness Alert w/no signs of acute distress.

## 2014-05-04 NOTE — Discharge Instructions (Signed)
Soak foot twice a day in betadine soap, take all of antibiotic as prescribed, see your doctor as planned for recheck.

## 2014-05-04 NOTE — ED Provider Notes (Signed)
CSN: [id number]     Arrival date & time 05/04/14  1855 History   First MD Initiated Contact with Patient 05/04/14 1900     Chief Complaint  Patient presents with  . Abrasion   (Consider location/radiation/quality/duration/timing/severity/associated sxs/prior Treatment) Patient is a 38 y.o. female presenting with wound check. The history is provided by the patient.  Wound Check This is a new problem. The current episode started more than 1 week ago (scraped right foot on cement 3wk ago, healing until recently when began to red and tender under eschar.). The problem has been gradually worsening.    Past Medical History  Diagnosis Date  . Morbid obesity   . Alveolar hypoventilation   . Asthma   . GERD (gastroesophageal reflux disease)   . Rectal fissure   . Hypertension   . Costochondritis   . Cellulitis 08/2010-08/2011  . Depression   . Bipolar 2 disorder   . Obstructive sleep apnea   . Chest pain     NO CAD by cath 1/82/99, nl LV systolic fxn  . Diabetes mellitus 2000    Type 2, Uncontrolled  . Arthritis   . HLD (hyperlipidemia)   . Anemia   . COPD (chronic obstructive pulmonary disease)    Past Surgical History  Procedure Laterality Date  . Carpal tunnel release    . Bil knee surgery    . Mass excision N/A 06/29/2013    Procedure:  WIDE LOCAL EXCISION OF POSTERIOR NECK ABSCESS;  Surgeon: Ralene Ok, MD;  Location: [facility];  Service: General;  Laterality: N/A;  . Cardiac catheterization     Family History  Problem Relation Age of Onset  . Diabetes Mother   . Hyperlipidemia Mother   . Depression Mother   . Heart attack Paternal Uncle   . Heart disease Paternal Grandmother   . Heart attack Paternal Grandmother   . Heart attack Paternal Grandfather   . Heart disease Paternal Grandfather    History  Substance Use Topics  . Smoking status: Former Smoker -- 0.30 packs/day for .3 years    Types: Cigarettes    Quit date: 12/06/1993  . Smokeless tobacco: Never Used    . Alcohol Use: No   OB History   Grav Para Term Preterm Abortions TAB SAB Ect Mult Living                 Review of Systems  Constitutional: Negative.   Musculoskeletal: Negative for gait problem.  Skin: Positive for wound.    Allergies  Kiwi extract and Nubain  Home Medications   Prior to Admission medications   Medication Sig Start Date End Date Taking? Authorizing Provider  albuterol (PROVENTIL HFA;VENTOLIN HFA) 108 (90 BASE) MCG/ACT inhaler Inhale 1 puff into the lungs every 6 (six) hours as needed for wheezing or shortness of breath.    Historical Provider, MD  atorvastatin (LIPITOR) 20 MG tablet Take 20 mg by mouth daily.    Historical Provider, MD  carvedilol (COREG) 12.5 MG tablet Take 12.5 mg by mouth 2 (two) times daily with a meal.    Historical Provider, MD  cephALEXin (KEFLEX) 500 MG capsule Take 1 capsule (500 mg total) by mouth 3 (three) times daily. Take all of medicine and drink lots of fluids 05/04/14   Billy Fischer, MD  cyclobenzaprine (FLEXERIL) 10 MG tablet Take 1 tablet (10 mg total) by mouth daily as needed for muscle spasms. Space refills 30 days apart 02/04/14   [name], MD  exenatide (BYETTA) 5 MCG/0.02ML SOPN injection Inject 0.02 mLs (5 mcg total) into the skin 2 (two) times daily with a meal. 02/15/14   [name], MD  HYDROcodone-acetaminophen (NORCO) 10-325 MG per tablet Take 1 tablet by mouth every 8 (eight) hours as needed for moderate pain. 02/04/14   [name], MD  ibuprofen (ADVIL,MOTRIN) 600 MG tablet Take 1 tablet (600 mg total) by mouth every 8 (eight) hours as needed. 03/27/14   Sherren Mocha, MD  insulin aspart (NOVOLOG) 100 UNIT/ML injection Inject 60 Units into the skin 3 (three) times daily with meals. 03/12/14   [name], MD  Insulin Glargine (LANTUS SOLOSTAR) 100 UNIT/ML SOPN Inject 115 Units into the skin 2 (two) times daily. 07/30/13   [name], MD  lisinopril (PRINIVIL,ZESTRIL) 10 MG tablet  Take 1 tablet (10 mg total) by mouth daily. 03/12/14   [name], MD  metFORMIN (GLUCOPHAGE) 500 MG tablet Take 500 mg by mouth 2 (two) times daily with a meal.    Historical Provider, MD  nitroGLYCERIN (NITROSTAT) 0.4 MG SL tablet Place 1 tablet (0.4 mg total) under the tongue every 5 (five) minutes as needed for chest pain.    [name], MD  pantoprazole (PROTONIX) 40 MG tablet Take 40 mg by mouth 2 (two) times daily.     Historical Provider, MD  SYMBICORT 160-4.5 MCG/ACT inhaler INHALE 2 PUFFS INTO LUNGS TWICE DAILY    [name], MD  traMADol (ULTRAM) 50 MG tablet Take 50 mg by mouth daily as needed for moderate pain.    Historical Provider, MD   LMP 04/07/2014 Physical Exam  Nursing note and vitals reviewed. Constitutional: She appears well-developed and well-nourished. No distress.  Musculoskeletal: She exhibits tenderness.       Feet:  Neurological: She is alert.  Skin: Skin is warm and dry. There is erythema.    ED Course  Procedures (including critical care time) Labs Review Labs Reviewed - No data to display  Imaging Review No results found.   MDM   1. Cellulitis of foot, right    Wound care given.    Billy Fischer, MD 05/04/14 [phone]

## 2014-05-08 ENCOUNTER — Ambulatory Visit: Payer: Medicaid Other | Admitting: Cardiovascular Disease

## 2014-05-08 ENCOUNTER — Ambulatory Visit: Payer: Medicaid Other | Admitting: Endocrinology

## 2014-05-13 ENCOUNTER — Encounter: Payer: Self-pay | Admitting: Endocrinology

## 2014-05-13 ENCOUNTER — Encounter: Payer: Self-pay | Admitting: Family Medicine

## 2014-05-13 ENCOUNTER — Telehealth: Payer: Self-pay | Admitting: *Deleted

## 2014-05-13 ENCOUNTER — Ambulatory Visit (INDEPENDENT_AMBULATORY_CARE_PROVIDER_SITE_OTHER): Payer: Medicaid Other | Admitting: Endocrinology

## 2014-05-13 ENCOUNTER — Ambulatory Visit (INDEPENDENT_AMBULATORY_CARE_PROVIDER_SITE_OTHER): Payer: Medicaid Other | Admitting: Family Medicine

## 2014-05-13 VITALS — BP 128/90 | HR 115 | Temp 98.7°F | Ht 62.0 in | Wt 323.0 lb

## 2014-05-13 VITALS — BP 148/90 | HR 114 | Wt 322.0 lb

## 2014-05-13 DIAGNOSIS — R059 Cough, unspecified: Secondary | ICD-10-CM

## 2014-05-13 DIAGNOSIS — E1165 Type 2 diabetes mellitus with hyperglycemia: Secondary | ICD-10-CM

## 2014-05-13 DIAGNOSIS — G8929 Other chronic pain: Secondary | ICD-10-CM

## 2014-05-13 DIAGNOSIS — IMO0002 Reserved for concepts with insufficient information to code with codable children: Secondary | ICD-10-CM

## 2014-05-13 DIAGNOSIS — I10 Essential (primary) hypertension: Secondary | ICD-10-CM

## 2014-05-13 DIAGNOSIS — IMO0001 Reserved for inherently not codable concepts without codable children: Secondary | ICD-10-CM

## 2014-05-13 DIAGNOSIS — R05 Cough: Secondary | ICD-10-CM

## 2014-05-13 DIAGNOSIS — Z7189 Other specified counseling: Secondary | ICD-10-CM

## 2014-05-13 LAB — POCT GLYCOSYLATED HEMOGLOBIN (HGB A1C)

## 2014-05-13 MED ORDER — FLUCONAZOLE 150 MG PO TABS: 150.0000 mg | ORAL_TABLET | Freq: Once | ORAL | Status: DC

## 2014-05-13 MED ORDER — HYDROCODONE-ACETAMINOPHEN 10-325 MG PO TABS: 1.0000 | ORAL_TABLET | Freq: Three times a day (TID) | ORAL | Status: DC | PRN

## 2014-05-13 NOTE — Progress Notes (Signed)
Patient ID: [name], female   DOB: 1976/03/02, 38 y.o.   MRN: [id number]  HPI:  R foot wound: About one month ago patient fell after slipping on cement. She hit the dorsal aspect of her right foot on some cement. She was seen in the emergency room, then here, then in urgent care due to the swelling. She completed a course of Keflex. She's been soaking her foot, and recently her scab fell off. Her foot is still quite painful. She thinks it is overall getting better, but is still painful. She does think she has a vaginal yeast infection, from having an antibiotics. She's been having vaginal itching. She requests treatment for this today.  Cough: Patient has had dry cough for about one month. Has not had any fevers. She does feel somewhat congested in her face. She's been using albuterol less than 2-3 times per week. She is more fatigued today because she traveled over the weekend.  Chronic pain: Has been using hydrocodone 3 times per day. Has not taken her hydrocodone in a few days because she's been out of the medicine. We have talked in the past about cutting back on her chronic pain medications, and she is willing to stop Flexeril today. She is still trying to walk to lose weight. She would like a referral to a surgeon to talk about gastric bypass.  ROS: See HPI  Yell: Not using anything for birth control, however patient now states she is not planning to get pregnant. She does not want anything for contraception at this time. She has appt with endocrinology this PM for diabetes management  PHYSICAL EXAM: BP 148/90  Pulse 114  Wt 322 lb (146.058 kg)  LMP 04/07/2014 Gen: NAD, pleasant, coopeartive HEENT: NCAT, face nontender to palpation, nares clear, MMM large tonsils Heart: mildly tachycardic, no murmurs Lungs: CTAB, NWOB Abdomen: obese Neuro: grossly nonfocal, speech normal Ext: R foot with quarter-sized ulcer on dorsal aspect of foot which is well granulated. No surrounding  erythema, drainage, or warmth. The area is tender to palpation.  ASSESSMENT/PLAN:  # Right foot wound: Appears well granulated today. No signs of superinfection. Clinical suspicion for osteomyelitis is low. Continue supportive care, followup in 3 weeks to reassess when. I will send in Diflucan for patient since she thinks she has a vaginal yeast infection after being treated for this wound.  See problem based charting for additional assessment/plan.  FOLLOW UP: F/u in 3-4 weeks for foot wound F/u in 2 months for chronic pain  [name], [place]

## 2014-05-13 NOTE — Assessment & Plan Note (Signed)
Patient requests referral to general surgeon for gastric bypass discussion. Will enter referral.

## 2014-05-13 NOTE — Telephone Encounter (Signed)
Dawn from [facility] called for NPI number.  NPI number given x 3 visits.  Pt has appt for 06/12/2014.  Derl Barrow, RN

## 2014-05-13 NOTE — Patient Instructions (Addendum)
good diet and exercise habits significanly improve the control of your diabetes.  please let me know if you wish to be referred to a dietician.  high blood sugar is very risky to your health.  you should see an eye doctor and dentist every year.  You are at higher than average risk for pneumonia and hepatitis-B.  You should be vaccinated against both.   controlling your blood pressure and cholesterol drastically reduces the damage diabetes does to your body.  this also applies to quitting smoking.  please discuss these with your doctor.  check your blood sugar twice a day.  vary the time of day when you check, between before the 3 meals, and at bedtime.  also check if you have symptoms of your blood sugar being too high or too low.  please keep a record of the readings and bring it to your next appointment here.  You can write it on any piece of paper.  please call us sooner if your blood sugar goes below 70, or if you have a lot of readings over 200. In view of your medical condition, you should avoid pregnancy until we have decided it is safe.   Please continue to work with your doctor on the depression.  Controlling this helps your blood sugar.   In view of your diabetes, you should keep a sharp eye on the scrapes on your right foot.   Here is a new meter, and some strips, to use until you can get a new meter from medicaid.   Please increase the lantus to 150 units, twice a day.  Please come back for a follow-up appointment in 1 month. Please call us in 3-4 days, to tell us how the blood sugar is doing.

## 2014-05-13 NOTE — Patient Instructions (Addendum)
It was great to see you again today!  For cough - I think this could be allergies. Take your zyrtec every day to see how it does.  For your foot wound - I think this is looking better. Follow up if you have a fever, it is worsening, starts to drain more, or you are worried about it. Come back in 3-4 weeks so we can recheck it.  We refilled your pain medicines today for 2 months worth. We are STOPPING your flexeril today.  I sent in diflucan for your yeast infection.  I am referring you to a gastric bypass surgeon for you to talk with. You will get a phone call to schedule this appointment.   Be well, Dr. [name]

## 2014-05-13 NOTE — Assessment & Plan Note (Signed)
Suspect this may be due to to seasonal allergies. This has been a particularly bad allergy season. Patient states she supposed to take Zyrtec, but has not been taking it lately. Will have her resume her Zyrtec daily, and monitor for improvement of her cough. Patient is agreeable to this plan.

## 2014-05-13 NOTE — Assessment & Plan Note (Signed)
Blood pressure mildly elevated today, this is higher than she's been previously. Will have her return in 3 weeks for a recheck of her foot wound, at which time we'll recheck her blood pressure is well.

## 2014-05-13 NOTE — Assessment & Plan Note (Signed)
Well-controlled. It will continue with her hydrocodone and tramadol. She is stopping the Flexeril as of today. We'll continue to titrate medicines down as we are able. Followup in 2 months.

## 2014-05-13 NOTE — Progress Notes (Signed)
Subjective:    Patient ID: [name], female    DOB: Jun 13, 1976, 38 y.o.   MRN: [id number]  HPI pt states DM was dx'ed in 2001, when she was hospitalized for severe hyperglycemia; she has mild if any neuropathy of the lower extremities; she is unaware of any associated chronic complications.  he has been on insulin since a 2007 pregnancy.  pt says her diet and exercise "vary."  she has never had pancreatitis, severe hypoglycemia or DKA.  Pt says she never misses any insulin doses.   Past Medical History  Diagnosis Date  . Morbid obesity   . Alveolar hypoventilation   . Asthma   . GERD (gastroesophageal reflux disease)   . Rectal fissure   . Hypertension   . Costochondritis   . Cellulitis 08/2010-08/2011  . Depression   . Bipolar 2 disorder   . Obstructive sleep apnea   . Chest pain     NO CAD by cath 04/23/83, nl LV systolic fxn  . Diabetes mellitus 2000    Type 2, Uncontrolled  . Arthritis   . HLD (hyperlipidemia)   . Anemia   . COPD (chronic obstructive pulmonary disease)     Past Surgical History  Procedure Laterality Date  . Carpal tunnel release    . Bil knee surgery    . Mass excision N/A 06/29/2013    Procedure:  WIDE LOCAL EXCISION OF POSTERIOR NECK ABSCESS;  Surgeon: Ralene Ok, MD;  Location: [facility];  Service: General;  Laterality: N/A;  . Cardiac catheterization      History   Social History  . Marital Status: Married    Spouse Name: N/A    Number of Children: N/A  . Years of Education: N/A   Occupational History  . Not on file.   Social History Main Topics  . Smoking status: Former Smoker -- 0.30 packs/day for .3 years    Types: Cigarettes    Quit date: 12/06/1993  . Smokeless tobacco: Never Used  . Alcohol Use: No  . Drug Use: No  . Sexual Activity: Yes    Birth Control/ Protection: None   Other Topics Concern  . Not on file   Social History Narrative   Lives in [place] with her fiance and 36 yr old dtr.    Current Outpatient Prescriptions  on File Prior to Visit  Medication Sig Dispense Refill  . albuterol (PROVENTIL HFA;VENTOLIN HFA) 108 (90 BASE) MCG/ACT inhaler Inhale 1 puff into the lungs every 6 (six) hours as needed for wheezing or shortness of breath.      Marland Kitchen atorvastatin (LIPITOR) 20 MG tablet Take 20 mg by mouth daily.      . carvedilol (COREG) 12.5 MG tablet Take 12.5 mg by mouth 2 (two) times daily with a meal.      . exenatide (BYETTA) 5 MCG/0.02ML SOPN injection Inject 0.02 mLs (5 mcg total) into the skin 2 (two) times daily with a meal.  1.2 mL  3  . fluconazole (DIFLUCAN) 150 MG tablet Take 1 tablet (150 mg total) by mouth once. Repeat in 3 days if not better.  2 tablet  0  . HYDROcodone-acetaminophen (NORCO) 10-325 MG per tablet Take 1 tablet by mouth every 8 (eight) hours as needed for moderate pain.  90 tablet  0  . insulin aspart (NOVOLOG) 100 UNIT/ML injection Inject 60 Units into the skin 3 (three) times daily with meals.  10 mL  5  . Insulin Glargine (LANTUS SOLOSTAR) 100 UNIT/ML SOPN  Inject 115 Units into the skin 2 (two) times daily.  5 pen  10  . lisinopril (PRINIVIL,ZESTRIL) 10 MG tablet Take 1 tablet (10 mg total) by mouth daily.  30 tablet  5  . metFORMIN (GLUCOPHAGE) 500 MG tablet Take 500 mg by mouth 2 (two) times daily with a meal.      . nitroGLYCERIN (NITROSTAT) 0.4 MG SL tablet Place 1 tablet (0.4 mg total) under the tongue every 5 (five) minutes as needed for chest pain.  30 tablet  0  . pantoprazole (PROTONIX) 40 MG tablet Take 40 mg by mouth 2 (two) times daily.       . SYMBICORT 160-4.5 MCG/ACT inhaler INHALE 2 PUFFS INTO LUNGS TWICE DAILY  10.2 g  5  . traMADol (ULTRAM) 50 MG tablet Take 50 mg by mouth daily as needed for moderate pain.       No current facility-administered medications on file prior to visit.    Allergies  Allergen Reactions  . Kiwi Extract Anaphylaxis  . Nubain [Nalbuphine Hcl] Other (See Comments)    Nervous...makes her feel like something is crawling on her.     Family History  Problem Relation Age of Onset  . Diabetes Mother   . Hyperlipidemia Mother   . Depression Mother   . Heart attack Paternal Uncle   . Heart disease Paternal Grandmother   . Heart attack Paternal Grandmother   . Heart attack Paternal Grandfather   . Heart disease Paternal Grandfather     BP 128/90  Pulse 115  Temp(Src) 98.7 F (37.1 C) (Oral)  Ht 5\' 2"  (1.575 m)  Wt 323 lb (146.512 kg)  BMI 59.06 kg/m2  SpO2 95%  LMP 04/07/2014  Review of Systems denies n/v, excessive diaphoresis, memory loss, cold intolerance, and rhinorrhea.  Pt reports blurry vision, doe, urinary frequency, muscle cramps, depression, easy bruising, and headache.  She has intermittent chest pain (cath was neg).  She has regular menses.  She has lost 80 lbs over the past 8 months.      Objective:   Physical Exam VS: see vs page GEN: no distress.  Morbid obesity. HEAD: head: no deformity eyes: no periorbital swelling, no proptosis external nose and ears are normal mouth: no lesion seen NECK: supple, thyroid is not enlarged CHEST WALL: no deformity LUNGS:  Clear to auscultation CV: reg rate and rhythm, no murmur ABD: abdomen is soft, nontender.  no hepatosplenomegaly.  not distended.  no hernia.   MUSCULOSKELETAL: muscle bulk and strength are grossly normal.  no obvious joint swelling.  gait is normal and steady EXTEMITIES: no deformity.  no ulcer on the feet.  feet are of normal color and temp.  no edema.  Several healing abrasions on the dorsal aspect of the right foot (recent fall).  No erythema. PULSES: dorsalis pedis intact bilat.  no carotid bruit NEURO:  cn 2-12 grossly intact.   readily moves all 4's.  sensation is intact to touch on the feet SKIN:  Normal texture and temperature.  No rash or suspicious lesion is visible.   NODES:  None palpable at the neck PSYCH: alert, well-oriented.  Does not appear anxious nor depressed.  Lab Results  Component Value Date   HGBA1C >14.0  05/13/2014       Assessment & Plan:  DM: severe exacerbation Morbid obesity: new to me: This impairs the ability to achieve glycemic control.  I'll work around this as best I can Depression: this complicates the rx of DM  Patient is advised the following: Patient Instructions  good diet and exercise habits significanly improve the control of your diabetes.  please let me know if you wish to be referred to a dietician.  high blood sugar is very risky to your health.  you should see an eye doctor and dentist every year.  You are at higher than average risk for pneumonia and hepatitis-B.  You should be vaccinated against both.   controlling your blood pressure and cholesterol drastically reduces the damage diabetes does to your body.  this also applies to quitting smoking.  please discuss these with your doctor.  check your blood sugar twice a day.  vary the time of day when you check, between before the 3 meals, and at bedtime.  also check if you have symptoms of your blood sugar being too high or too low.  please keep a record of the readings and bring it to your next appointment here.  You can write it on any piece of paper.  please call us sooner if your blood sugar goes below 70, or if you have a lot of readings over 200. In view of your medical condition, you should avoid pregnancy until we have decided it is safe.   Please continue to work with your doctor on the depression.  Controlling this helps your blood sugar.   In view of your diabetes, you should keep a sharp eye on the scrapes on your right foot.   Here is a new meter, and some strips, to use until you can get a new meter from medicaid.   Please increase the lantus to 150 units, twice a day.  Please come back for a follow-up appointment in 1 month. Please call us in 3-4 days, to tell us how the blood sugar is doing.

## 2014-05-16 ENCOUNTER — Telehealth: Payer: Self-pay | Admitting: Family Medicine

## 2014-05-16 ENCOUNTER — Other Ambulatory Visit: Payer: Self-pay | Admitting: Family Medicine

## 2014-05-16 NOTE — Telephone Encounter (Signed)
Pt needs letter showing she was in the office on Monday Please call when it is ready

## 2014-05-17 ENCOUNTER — Encounter: Payer: Self-pay | Admitting: *Deleted

## 2014-05-17 NOTE — Telephone Encounter (Signed)
Left message with female that letter was ready to be picked up.

## 2014-05-20 ENCOUNTER — Telehealth: Payer: Self-pay | Admitting: *Deleted

## 2014-05-20 NOTE — Telephone Encounter (Signed)
LM for patient to call back to receive information regarding appt with [facility].  Her appt is 05/31/2014 at 3pm with Dr. Alfonso Ramus.  She needs to do all she can to keep this appt.  This will be the 2nd she will miss if she can't keep it.  Thanks [employer]

## 2014-05-21 ENCOUNTER — Encounter: Payer: Self-pay | Admitting: Emergency Medicine

## 2014-05-21 ENCOUNTER — Ambulatory Visit (INDEPENDENT_AMBULATORY_CARE_PROVIDER_SITE_OTHER): Payer: Medicaid Other | Admitting: Emergency Medicine

## 2014-05-21 VITALS — BP 115/80 | HR 106 | Temp 97.9°F | Wt 321.0 lb

## 2014-05-21 DIAGNOSIS — H6590 Unspecified nonsuppurative otitis media, unspecified ear: Secondary | ICD-10-CM | POA: Insufficient documentation

## 2014-05-21 DIAGNOSIS — H60399 Other infective otitis externa, unspecified ear: Secondary | ICD-10-CM

## 2014-05-21 DIAGNOSIS — H6090 Unspecified otitis externa, unspecified ear: Secondary | ICD-10-CM | POA: Insufficient documentation

## 2014-05-21 MED ORDER — CIPROFLOXACIN-DEXAMETHASONE 0.3-0.1 % OT SUSP: 4.0000 [drp] | Freq: Two times a day (BID) | OTIC | Status: DC

## 2014-05-21 MED ORDER — FLUTICASONE PROPIONATE 50 MCG/ACT NA SUSP: 2.0000 | Freq: Every day | NASAL | Status: DC

## 2014-05-21 NOTE — Assessment & Plan Note (Signed)
Bilateral, no signs of infection. Likely secondary to allergies. Recommended treatment of allergies including Zyrtec, Flonase, nasal saline spray. Followup in one week for a recheck if her symptoms have not resolved.
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
## 2014-05-21 NOTE — Assessment & Plan Note (Signed)
On the right side only. Ciprodex 4 drops twice a day for 10 days.

## 2014-05-21 NOTE — Patient Instructions (Signed)
It was nice to see you!  You have swimmer's ear on the right. Apply Ciprodex ear drops, 4 drops twice a day for 10 days.  The muffled sensation is coming from your allergies causing fluid to be in her middle ear.  This fluid is NOT infected. Start your zyrtec. Use Flonase daily for the next 2 weeks. Use nasal saline spray 2-3 times a day.  Follow up next week, if your ears are still bothering you.

## 2014-05-31 ENCOUNTER — Other Ambulatory Visit: Payer: Self-pay | Admitting: Family Medicine

## 2014-06-03 ENCOUNTER — Encounter: Payer: Self-pay | Admitting: Family Medicine

## 2014-06-03 ENCOUNTER — Ambulatory Visit (INDEPENDENT_AMBULATORY_CARE_PROVIDER_SITE_OTHER): Payer: Medicaid Other | Admitting: Surgery

## 2014-06-03 NOTE — Progress Notes (Signed)
Pt came in stating that she was given wrong referral for neck when it should have been for back [phone].

## 2014-06-05 ENCOUNTER — Encounter (HOSPITAL_COMMUNITY): Payer: Self-pay | Admitting: Emergency Medicine

## 2014-06-05 ENCOUNTER — Emergency Department (HOSPITAL_COMMUNITY)
Admission: EM | Admit: 2014-06-05 | Discharge: 2014-06-05 | Disposition: A | Discharge status: Home / Self Care | Payer: Medicaid Other | Attending: Emergency Medicine | Admitting: Emergency Medicine

## 2014-06-05 DIAGNOSIS — K219 Gastro-esophageal reflux disease without esophagitis: Secondary | ICD-10-CM | POA: Insufficient documentation

## 2014-06-05 DIAGNOSIS — Z87891 Personal history of nicotine dependence: Secondary | ICD-10-CM | POA: Insufficient documentation

## 2014-06-05 DIAGNOSIS — Z8669 Personal history of other diseases of the nervous system and sense organs: Secondary | ICD-10-CM | POA: Insufficient documentation

## 2014-06-05 DIAGNOSIS — IMO0001 Reserved for inherently not codable concepts without codable children: Secondary | ICD-10-CM | POA: Insufficient documentation

## 2014-06-05 DIAGNOSIS — J449 Chronic obstructive pulmonary disease, unspecified: Secondary | ICD-10-CM | POA: Insufficient documentation

## 2014-06-05 DIAGNOSIS — IMO0002 Reserved for concepts with insufficient information to code with codable children: Secondary | ICD-10-CM | POA: Insufficient documentation

## 2014-06-05 DIAGNOSIS — J4489 Other specified chronic obstructive pulmonary disease: Secondary | ICD-10-CM | POA: Insufficient documentation

## 2014-06-05 DIAGNOSIS — Z872 Personal history of diseases of the skin and subcutaneous tissue: Secondary | ICD-10-CM | POA: Insufficient documentation

## 2014-06-05 DIAGNOSIS — E785 Hyperlipidemia, unspecified: Secondary | ICD-10-CM | POA: Insufficient documentation

## 2014-06-05 DIAGNOSIS — M129 Arthropathy, unspecified: Secondary | ICD-10-CM | POA: Insufficient documentation

## 2014-06-05 DIAGNOSIS — F3189 Other bipolar disorder: Secondary | ICD-10-CM | POA: Insufficient documentation

## 2014-06-05 DIAGNOSIS — R739 Hyperglycemia, unspecified: Secondary | ICD-10-CM

## 2014-06-05 DIAGNOSIS — I10 Essential (primary) hypertension: Secondary | ICD-10-CM | POA: Insufficient documentation

## 2014-06-05 DIAGNOSIS — Z792 Long term (current) use of antibiotics: Secondary | ICD-10-CM | POA: Insufficient documentation

## 2014-06-05 DIAGNOSIS — Z79899 Other long term (current) drug therapy: Secondary | ICD-10-CM | POA: Insufficient documentation

## 2014-06-05 DIAGNOSIS — Z794 Long term (current) use of insulin: Secondary | ICD-10-CM | POA: Insufficient documentation

## 2014-06-05 DIAGNOSIS — R252 Cramp and spasm: Secondary | ICD-10-CM

## 2014-06-05 DIAGNOSIS — Z862 Personal history of diseases of the blood and blood-forming organs and certain disorders involving the immune mechanism: Secondary | ICD-10-CM | POA: Insufficient documentation

## 2014-06-05 DIAGNOSIS — E1165 Type 2 diabetes mellitus with hyperglycemia: Secondary | ICD-10-CM

## 2014-06-05 LAB — I-STAT CHEM 8, ED
BUN: 9 mg/dL (ref 6–23)
CALCIUM ION: 1.12 mmol/L (ref 1.12–1.23)
Chloride: 95 mEq/L — ABNORMAL LOW (ref 96–112)
Creatinine, Ser: 0.5 mg/dL (ref 0.50–1.10)
Glucose, Bld: 462 mg/dL — ABNORMAL HIGH (ref 70–99)
HCT: 45 % (ref 36.0–46.0)
Hemoglobin: 15.3 g/dL — ABNORMAL HIGH (ref 12.0–15.0)
Potassium: 3.9 mEq/L (ref 3.7–5.3)
Sodium: 135 mEq/L — ABNORMAL LOW (ref 137–147)
TCO2: 24 mmol/L (ref 0–100)

## 2014-06-05 LAB — CBG MONITORING, ED
GLUCOSE-CAPILLARY: 447 mg/dL — AB (ref 70–99)
GLUCOSE-CAPILLARY: 345 mg/dL — AB (ref 70–99)

## 2014-06-05 LAB — CBC
HEMATOCRIT: 40.9 % (ref 36.0–46.0)
Hemoglobin: 14.1 g/dL (ref 12.0–15.0)
MCH: 30.1 pg (ref 26.0–34.0)
MCHC: 34.5 g/dL (ref 30.0–36.0)
MCV: 87.4 fL (ref 78.0–100.0)
PLATELETS: 288 10*3/uL (ref 150–400)
RBC: 4.68 MIL/uL (ref 3.87–5.11)
RDW: 12.3 % (ref 11.5–15.5)
WBC: 9.5 10*3/uL (ref 4.0–10.5)

## 2014-06-05 MED ORDER — SODIUM CHLORIDE 0.9 % IV BOLUS (SEPSIS)
1000.0000 mL | Freq: Once | INTRAVENOUS | Status: AC
  Administered 2014-06-05: 1000 mL via INTRAVENOUS

## 2014-06-05 MED ORDER — INSULIN ASPART 100 UNIT/ML ~~LOC~~ SOLN
SUBCUTANEOUS | Status: AC
  Filled 2014-06-05: qty 1

## 2014-06-05 MED ORDER — INSULIN ASPART 100 UNIT/ML ~~LOC~~ SOLN
15.0000 [IU] | Freq: Once | SUBCUTANEOUS | Status: AC
  Administered 2014-06-05: 15 [IU] via SUBCUTANEOUS

## 2014-06-05 NOTE — ED Notes (Addendum)
Pt ambulated to the bathroom independently, without difficulty.

## 2014-06-05 NOTE — ED Notes (Signed)
Pt resting in room, playing on phone in no distress.

## 2014-06-05 NOTE — ED Provider Notes (Signed)
Medical screening examination/treatment/procedure(s) were performed by non-physician practitioner and as supervising physician I was immediately available for consultation/collaboration.   EKG Interpretation None        Julianne Rice, MD 06/05/14 [phone]

## 2014-06-05 NOTE — Discharge Instructions (Signed)
It is important that your check your blood sugars more frequently Please try stretching your feet prior to bed as this may help your cramping You have been given additional information on nocturnal foot cramps  Please make an appointment with yoru PCP for further evaluation

## 2014-06-05 NOTE — ED Notes (Signed)
Pt reports bila feet pain, d/t neuropathy.  Pt reports it's been going on "for a while."  Pt reports she is DM type 1, but does not check her sugar.

## 2014-06-05 NOTE — ED Provider Notes (Signed)
CSN: [id number]     Arrival date & time 06/05/14  0057 History   First MD Initiated Contact with Patient 06/05/14 0111     Chief Complaint  Patient presents with  . Foot Pain     (Consider location/radiation/quality/duration/timing/severity/associated sxs/prior Treatment) HPI Comments: Patient reports feet cramp at night causing sleep disturbances  Has seen PCP for same a while ago and given Flexeril which is not helping.  States the condition has gotten worse in the past 3 weeks  Also states she does not test her blood sugar as she does not like to "prick herself"   Patient is a 38 y.o. female presenting with lower extremity pain. The history is provided by the patient.  Foot Pain This is a recurrent problem. The current episode started more than 1 month ago. The problem occurs intermittently. The problem has been unchanged. Pertinent negatives include no chills, fever, numbness, rash or weakness. Nothing aggravates the symptoms. She has tried nothing for the symptoms. The treatment provided no relief.    Past Medical History  Diagnosis Date  . Morbid obesity   . Alveolar hypoventilation   . Asthma   . GERD (gastroesophageal reflux disease)   . Rectal fissure   . Hypertension   . Costochondritis   . Cellulitis 08/2010-08/2011  . Depression   . Bipolar 2 disorder   . Obstructive sleep apnea   . Chest pain     NO CAD by cath 0/10/93, nl LV systolic fxn  . Diabetes mellitus 2000    Type 2, Uncontrolled  . Arthritis   . HLD (hyperlipidemia)   . Anemia   . COPD (chronic obstructive pulmonary disease)    Past Surgical History  Procedure Laterality Date  . Carpal tunnel release    . Bil knee surgery    . Mass excision N/A 06/29/2013    Procedure:  WIDE LOCAL EXCISION OF POSTERIOR NECK ABSCESS;  Surgeon: Ralene Ok, MD;  Location: [facility];  Service: General;  Laterality: N/A;  . Cardiac catheterization     Family History  Problem Relation Age of Onset  . Diabetes Mother   .  Hyperlipidemia Mother   . Depression Mother   . Heart attack Paternal Uncle   . Heart disease Paternal Grandmother   . Heart attack Paternal Grandmother   . Heart attack Paternal Grandfather   . Heart disease Paternal Grandfather    History  Substance Use Topics  . Smoking status: Former Smoker -- 0.30 packs/day for .3 years    Types: Cigarettes    Quit date: 12/06/1993  . Smokeless tobacco: Never Used  . Alcohol Use: No   OB History   Grav Para Term Preterm Abortions TAB SAB Ect Mult Living                 Review of Systems  Constitutional: Negative for fever and chills.  Respiratory: Negative for shortness of breath.   Cardiovascular: Negative for leg swelling.  Musculoskeletal: Negative for gait problem.  Skin: Negative for rash.  Neurological: Negative for weakness and numbness.  All other systems reviewed and are negative.     Allergies  Kiwi extract and Nubain  Home Medications   Prior to Admission medications   Medication Sig Start Date End Date Taking? Authorizing Provider  atorvastatin (LIPITOR) 20 MG tablet Take 20 mg by mouth daily.   Yes Historical Provider, MD  budesonide-formoterol (SYMBICORT) 160-4.5 MCG/ACT inhaler Inhale 2 puffs into the lungs 2 (two) times daily.   Yes  Historical Provider, MD  carvedilol (COREG) 12.5 MG tablet Take 12.5 mg by mouth 2 (two) times daily with a meal.   Yes Historical Provider, MD  exenatide (BYETTA) 5 MCG/0.02ML SOPN injection Inject 0.02 mLs (5 mcg total) into the skin 2 (two) times daily with a meal. 02/15/14  Yes [name], MD  fluconazole (DIFLUCAN) 150 MG tablet Take 1 tablet (150 mg total) by mouth once. Repeat in 3 days if not better. 05/13/14  Yes [name], MD  fluticasone (FLONASE) 50 MCG/ACT nasal spray Place 2 sprays into both nostrils daily. 05/21/14  Yes Melony Overly, MD  HYDROcodone-acetaminophen (NORCO) 10-325 MG per tablet Take 1 tablet by mouth every 8 (eight) hours as needed for moderate  pain. 05/13/14  Yes [name], MD  insulin aspart (NOVOLOG) 100 UNIT/ML injection Inject 60 Units into the skin 3 (three) times daily with meals. 03/12/14  Yes [name], MD  Insulin Glargine (LANTUS) 100 UNIT/ML Solostar Pen Inject 150 Units into the skin 2 (two) times daily. 07/30/13  Yes [name], MD  lisinopril (PRINIVIL,ZESTRIL) 10 MG tablet Take 1 tablet (10 mg total) by mouth daily. 03/12/14  Yes [name], MD  metFORMIN (GLUCOPHAGE) 500 MG tablet Take 500 mg by mouth 2 (two) times daily with a meal.   Yes Historical Provider, MD  pantoprazole (PROTONIX) 40 MG tablet Take 40 mg by mouth daily.   Yes Historical Provider, MD  traMADol (ULTRAM) 50 MG tablet Take 50 mg by mouth daily as needed for moderate pain.   Yes Historical Provider, MD  ACCU-CHEK FASTCLIX LANCETS MISC USE AS DIRECTED FOUR TIMES A DAY    [name], MD  albuterol (PROVENTIL HFA;VENTOLIN HFA) 108 (90 BASE) MCG/ACT inhaler Inhale 1 puff into the lungs every 6 (six) hours as needed for wheezing or shortness of breath.    Historical Provider, MD  ciprofloxacin-dexamethasone (CIPRODEX) otic suspension Place 4 drops into the right ear 2 (two) times daily. 05/21/14   Melony Overly, MD  nitroGLYCERIN (NITROSTAT) 0.4 MG SL tablet Place 1 tablet (0.4 mg total) under the tongue every 5 (five) minutes as needed for chest pain.    [name], MD   BP 112/83  Pulse 114  Temp(Src) 98 F (36.7 C) (Oral)  Resp 20  SpO2 94%  LMP 05/22/2014 Physical Exam  Nursing note and vitals reviewed. Constitutional: She is oriented to person, place, and time. She appears well-developed and well-nourished.  Morbid obesity   HENT:  Head: Normocephalic.  Eyes: Pupils are equal, round, and reactive to light.  Neck: Normal range of motion.  Cardiovascular: Normal rate.   Pulmonary/Chest: Effort normal.  Musculoskeletal: She exhibits no edema and no tenderness.  Neurological: She is alert and  oriented to person, place, and time. She has normal strength. No sensory deficit.  Skin: Skin is warm and dry. No rash noted. No erythema.  Psychiatric: She has a normal mood and affect. Her behavior is normal.    ED Course  Procedures (including critical care time) Labs Review Labs Reviewed  CBG MONITORING, ED - Abnormal; Notable for the following:    Glucose-Capillary 447 (*)    All other components within normal limits  I-STAT CHEM 8, ED - Abnormal; Notable for the following:    Sodium 135 (*)    Chloride 95 (*)    Glucose, Bld 462 (*)    Hemoglobin 15.3 (*)    All other components within normal limits  CBG MONITORING, ED - Abnormal; Notable for the following:    Glucose-Capillary 345 (*)    All other components within normal limits  CBC    Imaging Review No results found.   EKG Interpretation None      MDM  Patient was given IV fluids and SQ insulin with a significant tdrop but refuses to stay  longer stating her husband needs to go to work  Final diagnoses:  Nocturnal foot cramps  Hyperglycemia         Garald Balding, NP 06/05/14 0407

## 2014-06-05 NOTE — ED Notes (Signed)
Nurse will draw blood 

## 2014-06-06 ENCOUNTER — Ambulatory Visit: Payer: Medicaid Other | Admitting: Family Medicine

## 2014-06-10 ENCOUNTER — Other Ambulatory Visit: Payer: Self-pay | Admitting: Family Medicine

## 2014-06-10 ENCOUNTER — Other Ambulatory Visit: Payer: Self-pay | Admitting: *Deleted

## 2014-06-10 NOTE — Progress Notes (Signed)
Red team, can you call pt and clarify? I am not sure what she is talking about. Thanks, [name], MD

## 2014-06-11 ENCOUNTER — Other Ambulatory Visit: Payer: Self-pay | Admitting: Family Medicine

## 2014-06-11 ENCOUNTER — Encounter: Payer: Self-pay | Admitting: Family Medicine

## 2014-06-11 ENCOUNTER — Ambulatory Visit (INDEPENDENT_AMBULATORY_CARE_PROVIDER_SITE_OTHER): Payer: Medicaid Other | Admitting: Family Medicine

## 2014-06-11 VITALS — BP 138/82 | HR 108 | Ht 62.0 in | Wt 320.0 lb

## 2014-06-11 DIAGNOSIS — S91309A Unspecified open wound, unspecified foot, initial encounter: Secondary | ICD-10-CM

## 2014-06-11 DIAGNOSIS — IMO0002 Reserved for concepts with insufficient information to code with codable children: Secondary | ICD-10-CM

## 2014-06-11 DIAGNOSIS — Z5189 Encounter for other specified aftercare: Secondary | ICD-10-CM

## 2014-06-11 DIAGNOSIS — S91301D Unspecified open wound, right foot, subsequent encounter: Secondary | ICD-10-CM

## 2014-06-11 DIAGNOSIS — I10 Essential (primary) hypertension: Secondary | ICD-10-CM

## 2014-06-11 DIAGNOSIS — E1165 Type 2 diabetes mellitus with hyperglycemia: Secondary | ICD-10-CM

## 2014-06-11 DIAGNOSIS — IMO0001 Reserved for inherently not codable concepts without codable children: Secondary | ICD-10-CM

## 2014-06-11 MED ORDER — ACCU-CHEK COMPACT PLUS CARE KIT: PACK | Status: DC

## 2014-06-11 MED ORDER — TRAMADOL HCL 50 MG PO TABS: 50.0000 mg | ORAL_TABLET | Freq: Every day | ORAL | Status: DC | PRN

## 2014-06-11 MED ORDER — BLOOD PRESSURE MONITOR KIT: PACK | Status: DC

## 2014-06-11 MED ORDER — CETIRIZINE HCL 10 MG PO TABS: 10.0000 mg | ORAL_TABLET | Freq: Every day | ORAL | Status: DC

## 2014-06-11 MED ORDER — GLUCOSE BLOOD VI STRP: ORAL_STRIP | Status: DC

## 2014-06-11 MED ORDER — ALBUTEROL SULFATE HFA 108 (90 BASE) MCG/ACT IN AERS: 1.0000 | INHALATION_SPRAY | Freq: Four times a day (QID) | RESPIRATORY_TRACT | Status: DC | PRN

## 2014-06-11 NOTE — Patient Instructions (Addendum)
It was great to see you again today!  For your glucometer, I sent in a prescription for a new one, called the [facility]. I also sent in test strips.  For your blood pressure, the repeat pressure was good. Let's continue your current medicines.  Your foot looks like it is doing better. I will look into the tramadol refill.  Follow up in 1 month for your chronic pain. Follow up separately to discuss your finger numbness and stomach spot numbness.  Be well, Dr. [name]

## 2014-06-11 NOTE — Assessment & Plan Note (Signed)
Initially elevated, but improved on recheck. Recently normal renal function last week at ER visit. Continue current regimen.

## 2014-06-11 NOTE — Progress Notes (Signed)
Patient ID: [name], female   DOB: [birth year], 38 y.o.   MRN: [id number]  HPI:  Pt presents today to f/u on her BP and foot wound.  Foot wound: thinks it is doing a lot better, she thinks it's healed all the way. No problems walking.  HTN: does not check blood pressure at home, does not have a meter. Currently taking lisinopril 10mg  daily, carvedilol 12.5 mg BID. Denies CP, SOB, edema, syncope, palpitations. Wants renal function evaluated.  Glucose meter: her current glucometer is not working. Says she's had 4 of these in the past that keep breaking. Currently has accuchek [employer]. Wants to know if medicaid covers something else. Has appt tomorrow with her endocrinologist.   Pt also complains of numbness in her finger, numbness on skin spots on stomach, and throbbing in her feet at night. Advised she needs a separate visit to discuss these issues. Recently treated with diflucan for yeast infection - this cleared up well.  ROS: See HPI  [place]: hx poorly controlled T2DM, morbid obesity, chronic pain on chronic narcotics, OSA, OHS, HLD, GERD, asthma  PHYSICAL EXAM: BP 138/82  Pulse 108  Ht 5\' 2"  (1.575 m)  Wt 320 lb (145.151 kg)  BMI 58.51 kg/m2  LMP 05/22/2014 Gen: NAD, pleasant, cooperative HEENT: NCAT Heart: mildly tachycardic regular rhythm, no murmurs Lungs: CTAB, NWOB Neuro: grossly nonfocal speech normal Ext: No appreciable lower extremity edema bilaterally. R dorsal foot with previous area of nickel-sized infection now well healed and scarred over. 2+ DP pulses bilaterally. Full strength with R ankle dorsiflexion, plantarflexion, inversion, eversion. No other wounds present on foot.  ASSESSMENT/PLAN:  # Foot wound: appears well healed, no complications. Continue to monitor and f/u prn.  See problem based charting for additional assessment/plan.  FOLLOW UP: F/u in 1 month for chronic pain F/u separately for other complaints: finger numbness, stomach skin numbness, feet  hurting at night.  [place]. [name], [place]

## 2014-06-11 NOTE — Assessment & Plan Note (Signed)
>>  ASSESSMENT AND PLAN FOR INSULIN DEPENDENT TYPE 2 DIABETES MELLITUS ([place]) WRITTEN ON 06/11/2014  2:23 PM BY Katalena Malveaux, [name], MD  Gave rx for new glucose meter (accuchek compact plus) which should be covered by medicaid. Also prescribed new test strips. She has f/u with endocrinology tomorrow. Advised that byetta refill needs to be sent in by her endocrinologist since they are managing her diabetes now.

## 2014-06-11 NOTE — Assessment & Plan Note (Signed)
Gave rx for new glucose meter (accuchek compact plus) which should be covered by medicaid. Also prescribed new test strips. She has f/u with endocrinology tomorrow. Advised that byetta refill needs to be sent in by her endocrinologist since they are managing her diabetes now.

## 2014-06-12 ENCOUNTER — Ambulatory Visit: Payer: Medicaid Other | Admitting: Endocrinology

## 2014-06-14 ENCOUNTER — Other Ambulatory Visit: Payer: Self-pay | Admitting: Family Medicine

## 2014-06-19 ENCOUNTER — Telehealth: Payer: Self-pay | Admitting: *Deleted

## 2014-06-19 NOTE — Telephone Encounter (Signed)
Received call from [name] of [place] this AM regarding pt's diabetic supplies.  Pt insurance will cover the [employer], Smart View fast click lancet and smart view test strips.  Verbal order given by Dr. [name] to change diabetic supplies.  Derl Barrow, RN

## 2014-06-22 ENCOUNTER — Encounter (HOSPITAL_COMMUNITY): Payer: Self-pay | Admitting: Emergency Medicine

## 2014-06-22 ENCOUNTER — Emergency Department (INDEPENDENT_AMBULATORY_CARE_PROVIDER_SITE_OTHER)
Admission: EM | Admit: 2014-06-22 | Discharge: 2014-06-22 | Disposition: A | Discharge status: Home / Self Care | Payer: Medicaid Other | Source: Home / Self Care | Attending: Emergency Medicine | Admitting: Emergency Medicine

## 2014-06-22 DIAGNOSIS — J3489 Other specified disorders of nose and nasal sinuses: Secondary | ICD-10-CM

## 2014-06-22 DIAGNOSIS — J019 Acute sinusitis, unspecified: Secondary | ICD-10-CM

## 2014-06-22 LAB — POCT PREGNANCY, URINE: Preg Test, Ur: NEGATIVE

## 2014-06-22 MED ORDER — HYDROCODONE-ACETAMINOPHEN 5-325 MG PO TABS: 1.0000 | ORAL_TABLET | Freq: Four times a day (QID) | ORAL | Status: DC | PRN

## 2014-06-22 MED ORDER — DOXYCYCLINE HYCLATE 100 MG PO TABS
ORAL_TABLET | ORAL | Status: AC
  Filled 2014-06-22: qty 1

## 2014-06-22 MED ORDER — DOXYCYCLINE HYCLATE 100 MG PO TABS
100.0000 mg | ORAL_TABLET | Freq: Once | ORAL | Status: AC
  Administered 2014-06-22: 100 mg via ORAL

## 2014-06-22 MED ORDER — DOXYCYCLINE HYCLATE 100 MG PO CAPS: 100.0000 mg | ORAL_CAPSULE | Freq: Two times a day (BID) | ORAL | Status: DC

## 2014-06-22 NOTE — ED Provider Notes (Signed)
CSN: [id number]     Arrival date & time 06/22/14  1821 History   None    Chief Complaint  Patient presents with  . Facial Pain   (Consider location/radiation/quality/duration/timing/severity/associated sxs/prior Treatment) HPI Comments: 38 year old female presents complaining of pain inside the bridge of her nose since yesterday with nasal congestion, sinus pressure, and burning. She has the sinus pressure and congestion for one week, and the pain just started the day before yesterday. She rates the pain as 8/10 in severity. She denies cough, sore throat, fever. No recent travel or sick contacts. She has a history of sinus infections but has never had one that resulted in this severe nose pain. She denies any trauma. She denies new glasses   Past Medical History  Diagnosis Date  . Morbid obesity   . Alveolar hypoventilation   . Asthma   . GERD (gastroesophageal reflux disease)   . Rectal fissure   . Hypertension   . Costochondritis   . Cellulitis 08/2010-08/2011  . Depression   . Bipolar 2 disorder   . Obstructive sleep apnea   . Chest pain     NO CAD by cath 06/30/35, nl LV systolic fxn  . Diabetes mellitus 2000    Type 2, Uncontrolled  . Arthritis   . HLD (hyperlipidemia)   . Anemia   . COPD (chronic obstructive pulmonary disease)    Past Surgical History  Procedure Laterality Date  . Carpal tunnel release    . Bil knee surgery    . Mass excision N/A 06/29/2013    Procedure:  WIDE LOCAL EXCISION OF POSTERIOR NECK ABSCESS;  Surgeon: Ralene Ok, MD;  Location: [facility];  Service: General;  Laterality: N/A;  . Cardiac catheterization     Family History  Problem Relation Age of Onset  . Diabetes Mother   . Hyperlipidemia Mother   . Depression Mother   . Heart attack Paternal Uncle   . Heart disease Paternal Grandmother   . Heart attack Paternal Grandmother   . Heart attack Paternal Grandfather   . Heart disease Paternal Grandfather    History  Substance Use Topics  .  Smoking status: Former Smoker -- 0.30 packs/day for .3 years    Types: Cigarettes    Quit date: 12/06/1993  . Smokeless tobacco: Never Used  . Alcohol Use: No   OB History   Grav Para Term Preterm Abortions TAB SAB Ect Mult Living                 Review of Systems  Constitutional: Negative for fever and chills.  HENT: Positive for congestion (with nose pain.), postnasal drip, rhinorrhea and sinus pressure. Negative for ear pain, sore throat and trouble swallowing.   Respiratory: Negative for cough and shortness of breath.   All other systems reviewed and are negative.   Allergies  Kiwi extract and Nubain  Home Medications   Prior to Admission medications   Medication Sig Start Date End Date Taking? Authorizing Provider  ACCU-CHEK FASTCLIX LANCETS MISC USE AS DIRECTED FOUR TIMES A DAY   Yes [name], MD  albuterol (PROVENTIL HFA;VENTOLIN HFA) 108 (90 BASE) MCG/ACT inhaler Inhale 1 puff into the lungs every 6 (six) hours as needed for wheezing or shortness of breath. 06/11/14  Yes [name], MD  atorvastatin (LIPITOR) 20 MG tablet Take 20 mg by mouth daily.   Yes Historical Provider, MD  Blood Glucose Monitoring Suppl (ACCU-CHEK COMPACT CARE KIT) KIT Check blood sugar 4x daily 06/11/14  Yes [name], MD  Blood Pressure Monitor KIT Check blood pressure 2-3x/week 06/11/14  Yes [name], MD  budesonide-formoterol [facility]) 160-4.5 MCG/ACT inhaler Inhale 2 puffs into the lungs 2 (two) times daily.   Yes Historical Provider, MD  carvedilol (COREG) 12.5 MG tablet Take 12.5 mg by mouth 2 (two) times daily with a meal.   Yes Historical Provider, MD  cetirizine (ZYRTEC) 10 MG tablet Take 1 tablet (10 mg total) by mouth daily. 06/11/14  Yes [name], MD  EASY TOUCH PEN NEEDLES 31G X 8 MM MISC USE TO INJECT INSULIN, 7 TIMES A DAY, OR AS DIRECTED.   Yes [name], MD  exenatide (BYETTA) 5 MCG/0.02ML SOPN injection Inject 0.02 mLs (5 mcg  total) into the skin 2 (two) times daily with a meal. 02/15/14  Yes [name], MD  fluticasone [facility]) 50 MCG/ACT nasal spray Place 2 sprays into both nostrils daily. 05/21/14  Yes Melony Overly, MD  glucose blood (ACCU-CHEK COMPACT PLUS) test strip Use as instructed to check blood sugar 4x daily 06/11/14  Yes [name], MD  HYDROcodone-acetaminophen [facility]) 10-325 MG per tablet Take 1 tablet by mouth every 8 (eight) hours as needed for moderate pain. 05/13/14  Yes [name], MD  insulin aspart (NOVOLOG) 100 UNIT/ML injection Inject 60 Units into the skin 3 (three) times daily with meals. 03/12/14  Yes [name], MD  Insulin Glargine (LANTUS) 100 UNIT/ML Solostar Pen Inject 150 Units into the skin 2 (two) times daily. 07/30/13  Yes [name], MD  lisinopril (PRINIVIL,ZESTRIL) 10 MG tablet Take 1 tablet (10 mg total) by mouth daily. 03/12/14  Yes [name], MD  metFORMIN (GLUCOPHAGE) 500 MG tablet Take 500 mg by mouth 2 (two) times daily with a meal.   Yes Historical Provider, MD  nitroGLYCERIN (NITROSTAT) 0.4 MG SL tablet Place 1 tablet (0.4 mg total) under the tongue every 5 (five) minutes as needed for chest pain.   Yes [name], MD  pantoprazole (PROTONIX) 40 MG tablet TAKE 1 TABLET BY MOUTH EVERY DAY   Yes [name], MD  doxycycline (VIBRAMYCIN) 100 MG capsule Take 1 capsule (100 mg total) by mouth 2 (two) times daily. 06/22/14   Liam Graham, PA-C  HYDROcodone-acetaminophen (NORCO) 5-325 MG per tablet Take 1 tablet by mouth every 6 (six) hours as needed for moderate pain. 06/22/14   Liam Graham, PA-C  traMADol (ULTRAM) 50 MG tablet Take 1 tablet (50 mg total) by mouth daily as needed for moderate pain. 06/11/14   [name], MD   BP 140/92  Pulse 116  Temp(Src) 98.6 F (37 C) (Oral)  Resp 20  SpO2 97%  LMP 05/22/2014 Physical Exam  Nursing note and vitals reviewed. Constitutional: She is oriented to person,  place, and time. Vital signs are normal. She appears well-developed and well-nourished. No distress.  HENT:  Head: Normocephalic and atraumatic.  Nose: Mucosal edema and sinus tenderness ( tenderness over the bridge of the nose) present. Right sinus exhibits no maxillary sinus tenderness and no frontal sinus tenderness. Left sinus exhibits no maxillary sinus tenderness and no frontal sinus tenderness.  Mouth/Throat: Uvula is midline and mucous membranes are normal.  Purulent drainage in the posterior oropharynx  Neck: Normal range of motion. Neck supple.  Cardiovascular: Regular rhythm and normal heart sounds.  Tachycardia present.   Pulmonary/Chest: Effort normal and breath sounds normal. No respiratory distress.  Lymphadenopathy:  She has cervical adenopathy.  Neurological: She is alert and oriented to person, place, and time. She has normal strength. Coordination normal.  Skin: Skin is warm and dry. No rash noted. She is not diaphoretic.  Psychiatric: She has a normal mood and affect. Judgment normal.    ED Course  Procedures (including critical care time) Labs Review Labs Reviewed  POCT PREGNANCY, URINE    Imaging Review No results found.   MDM   1. Nose pain   2. Acute rhinosinusitis    The patient has tachycardia at baseline upon review of many charts. This most fits with acute sinusitis, will use doxycycline to cover any potential superficial infection in the nose that would probably be caused by staph. Followup if no improvement in a few days  Meds ordered this encounter  Medications  . doxycycline (VIBRAMYCIN) 100 MG capsule    Sig: Take 1 capsule (100 mg total) by mouth 2 (two) times daily.    Dispense:  20 capsule    Refill:  0    Order Specific Question:  Supervising Provider    Answer:  Jake Michaelis, DAVID C [id number]  . HYDROcodone-acetaminophen (NORCO) 5-325 MG per tablet    Sig: Take 1 tablet by mouth every 6 (six) hours as needed for moderate pain.    Dispense:   10 tablet    Refill:  0    Order Specific Question:  Supervising Provider    Answer:  Jake Michaelis, DAVID C [id number]  . doxycycline (VIBRA-TABS) tablet 100 mg    Sig:        Liam Graham, PA-C 06/22/14 1909

## 2014-06-22 NOTE — ED Notes (Signed)
Pt tearful. C/O severe pain inside bridge of nose and between eyes since yesterday with a lot of facial congestion.  Has taken Zyrtec without any relief.  Denies fevers.

## 2014-06-22 NOTE — Discharge Instructions (Signed)
Facial Infection You have an infection of your face. This requires special attention to help prevent serious problems. Infections in facial wounds can cause poor healing and scars. They can also spread to deeper tissues, especially around the eye. Wound and dental infections can lead to sinusitis, infection of the eye socket, and even meningitis. Permanent damage to the skin, eye, and nervous system may result if facial infections are not treated properly. With severe infections, hospital care for IV antibiotic injections may be needed if they don't respond to oral antibiotics. Antibiotics must be taken for the full course to insure the infection is eliminated. If the infection came from a bad tooth, it may have to be extracted when the infection is under control. Warm compresses may be applied to reduce skin irritation and remove drainage. You might need a tetanus shot now if:  You cannot remember when your last tetanus shot was.  You have never had a tetanus shot.  The object that caused your wound was dirty. If you need a tetanus shot, and you decide not to get one, there is a rare chance of getting tetanus. Sickness from tetanus can be serious. If you got a tetanus shot, your arm may swell, get red and warm to the touch at the shot site. This is common and not a problem. SEEK IMMEDIATE MEDICAL CARE IF:   You have increased swelling, redness, or trouble breathing.  You have a severe headache, dizziness, nausea, or vomiting.  You develop problems with your eyesight.  You have a fever. Document Released: 12/30/2004 Document Revised: 02/14/2012 Document Reviewed: 11/22/2005 [facility] Patient Information 2015 [place], [place]. This information is not intended to replace advice given to you by your health care provider. Make sure you discuss any questions you have with your health care provider.  Sinusitis Sinusitis is redness, soreness, and swelling (inflammation) of the paranasal sinuses.  Paranasal sinuses are air pockets within the bones of your face (beneath the eyes, the middle of the forehead, or above the eyes). In healthy paranasal sinuses, mucus is able to drain out, and air is able to circulate through them by way of your nose. However, when your paranasal sinuses are inflamed, mucus and air can become trapped. This can allow bacteria and other germs to grow and cause infection. Sinusitis can develop quickly and last only a short time (acute) or continue over a long period (chronic). Sinusitis that lasts for more than 12 weeks is considered chronic.  CAUSES  Causes of sinusitis include:  Allergies.  Structural abnormalities, such as displacement of the cartilage that separates your nostrils (deviated septum), which can decrease the air flow through your nose and sinuses and affect sinus drainage.  Functional abnormalities, such as when the small hairs (cilia) that line your sinuses and help remove mucus do not work properly or are not present. SYMPTOMS  Symptoms of acute and chronic sinusitis are the same. The primary symptoms are pain and pressure around the affected sinuses. Other symptoms include:  Upper toothache.  Earache.  Headache.  Bad breath.  Decreased sense of smell and taste.  A cough, which worsens when you are lying flat.  Fatigue.  Fever.  Thick drainage from your nose, which often is green and may contain pus (purulent).  Swelling and warmth over the affected sinuses. DIAGNOSIS  Your caregiver will perform a physical exam. During the exam, your caregiver may:  Look in your nose for signs of abnormal growths in your nostrils (nasal polyps).  Tap over  the affected sinus to check for signs of infection.  View the inside of your sinuses (endoscopy) with a special imaging device with a light attached (endoscope), which is inserted into your sinuses. If your caregiver suspects that you have chronic sinusitis, one or more of the following tests  may be recommended:  Allergy tests.  Nasal culture--A sample of mucus is taken from your nose and sent to a lab and screened for bacteria.  Nasal cytology--A sample of mucus is taken from your nose and examined by your caregiver to determine if your sinusitis is related to an allergy. TREATMENT  Most cases of acute sinusitis are related to a viral infection and will resolve on their own within 10 days. Sometimes medicines are prescribed to help relieve symptoms (pain medicine, decongestants, nasal steroid sprays, or saline sprays).  However, for sinusitis related to a bacterial infection, your caregiver will prescribe antibiotic medicines. These are medicines that will help kill the bacteria causing the infection.  Rarely, sinusitis is caused by a fungal infection. In theses cases, your caregiver will prescribe antifungal medicine. For some cases of chronic sinusitis, surgery is needed. Generally, these are cases in which sinusitis recurs more than 3 times per year, despite other treatments. HOME CARE INSTRUCTIONS   Drink plenty of water. Water helps thin the mucus so your sinuses can drain more easily.  Use a humidifier.  Inhale steam 3 to 4 times a day (for example, sit in the bathroom with the shower running).  Apply a warm, moist washcloth to your face 3 to 4 times a day, or as directed by your caregiver.  Use saline nasal sprays to help moisten and clean your sinuses.  Take over-the-counter or prescription medicines for pain, discomfort, or fever only as directed by your caregiver. SEEK IMMEDIATE MEDICAL CARE IF:  You have increasing pain or severe headaches.  You have nausea, vomiting, or drowsiness.  You have swelling around your face.  You have vision problems.  You have a stiff neck.  You have difficulty breathing. MAKE SURE YOU:   Understand these instructions.  Will watch your condition.  Will get help right away if you are not doing well or get worse. Document  Released: 11/22/2005 Document Revised: 02/14/2012 Document Reviewed: 12/07/2011 [facility] Patient Information 2015 [place], [place]. This information is not intended to replace advice given to you by your health care provider. Make sure you discuss any questions you have with your health care provider.

## 2014-06-23 ENCOUNTER — Encounter (HOSPITAL_COMMUNITY): Payer: Self-pay | Admitting: Emergency Medicine

## 2014-06-23 ENCOUNTER — Emergency Department (HOSPITAL_COMMUNITY): Payer: Medicaid Other

## 2014-06-23 ENCOUNTER — Emergency Department (HOSPITAL_COMMUNITY)
Admission: EM | Admit: 2014-06-23 | Discharge: 2014-06-23 | Disposition: A | Discharge status: Home / Self Care | Payer: Medicaid Other | Attending: Emergency Medicine | Admitting: Emergency Medicine

## 2014-06-23 DIAGNOSIS — E1165 Type 2 diabetes mellitus with hyperglycemia: Secondary | ICD-10-CM

## 2014-06-23 DIAGNOSIS — K219 Gastro-esophageal reflux disease without esophagitis: Secondary | ICD-10-CM | POA: Diagnosis not present

## 2014-06-23 DIAGNOSIS — J449 Chronic obstructive pulmonary disease, unspecified: Secondary | ICD-10-CM | POA: Insufficient documentation

## 2014-06-23 DIAGNOSIS — IMO0002 Reserved for concepts with insufficient information to code with codable children: Secondary | ICD-10-CM | POA: Diagnosis not present

## 2014-06-23 DIAGNOSIS — R1011 Right upper quadrant pain: Secondary | ICD-10-CM | POA: Diagnosis not present

## 2014-06-23 DIAGNOSIS — Z792 Long term (current) use of antibiotics: Secondary | ICD-10-CM | POA: Insufficient documentation

## 2014-06-23 DIAGNOSIS — B373 Candidiasis of vulva and vagina: Secondary | ICD-10-CM | POA: Diagnosis not present

## 2014-06-23 DIAGNOSIS — Z79899 Other long term (current) drug therapy: Secondary | ICD-10-CM | POA: Diagnosis not present

## 2014-06-23 DIAGNOSIS — Z794 Long term (current) use of insulin: Secondary | ICD-10-CM | POA: Diagnosis not present

## 2014-06-23 DIAGNOSIS — R1013 Epigastric pain: Secondary | ICD-10-CM | POA: Diagnosis not present

## 2014-06-23 DIAGNOSIS — B3731 Acute candidiasis of vulva and vagina: Secondary | ICD-10-CM | POA: Diagnosis not present

## 2014-06-23 DIAGNOSIS — Z872 Personal history of diseases of the skin and subcutaneous tissue: Secondary | ICD-10-CM | POA: Diagnosis not present

## 2014-06-23 DIAGNOSIS — M129 Arthropathy, unspecified: Secondary | ICD-10-CM | POA: Insufficient documentation

## 2014-06-23 DIAGNOSIS — I10 Essential (primary) hypertension: Secondary | ICD-10-CM | POA: Insufficient documentation

## 2014-06-23 DIAGNOSIS — J019 Acute sinusitis, unspecified: Secondary | ICD-10-CM

## 2014-06-23 DIAGNOSIS — Z8659 Personal history of other mental and behavioral disorders: Secondary | ICD-10-CM | POA: Diagnosis not present

## 2014-06-23 DIAGNOSIS — Z87891 Personal history of nicotine dependence: Secondary | ICD-10-CM | POA: Diagnosis not present

## 2014-06-23 DIAGNOSIS — Z8669 Personal history of other diseases of the nervous system and sense organs: Secondary | ICD-10-CM | POA: Insufficient documentation

## 2014-06-23 DIAGNOSIS — J4489 Other specified chronic obstructive pulmonary disease: Secondary | ICD-10-CM | POA: Insufficient documentation

## 2014-06-23 DIAGNOSIS — Z862 Personal history of diseases of the blood and blood-forming organs and certain disorders involving the immune mechanism: Secondary | ICD-10-CM | POA: Diagnosis not present

## 2014-06-23 DIAGNOSIS — E785 Hyperlipidemia, unspecified: Secondary | ICD-10-CM | POA: Diagnosis not present

## 2014-06-23 DIAGNOSIS — E119 Type 2 diabetes mellitus without complications: Secondary | ICD-10-CM | POA: Insufficient documentation

## 2014-06-23 LAB — URINALYSIS, ROUTINE W REFLEX MICROSCOPIC
Bilirubin Urine: NEGATIVE
Glucose, UA: >1000 mg/dL — AB
Hgb urine dipstick: NEGATIVE
Ketones, ur: NEGATIVE mg/dL
LEUKOCYTES UA: NEGATIVE
Nitrite: NEGATIVE
PH: 5.5 (ref 5.0–8.0)
Protein, ur: 30 mg/dL — AB
Specific Gravity, Urine: 1.015 (ref 1.005–1.030)
Urobilinogen, UA: 0.2 mg/dL (ref 0.0–1.0)

## 2014-06-23 LAB — COMPREHENSIVE METABOLIC PANEL
ALK PHOS: 151 U/L — AB (ref 39–117)
ALT: 15 U/L (ref 0–35)
AST: 10 U/L (ref 0–37)
Albumin: 2.7 g/dL — ABNORMAL LOW (ref 3.5–5.2)
Anion gap: 14 (ref 5–15)
BUN: 9 mg/dL (ref 6–23)
CHLORIDE: 97 meq/L (ref 96–112)
CO2: 28 meq/L (ref 19–32)
Calcium: 8.7 mg/dL (ref 8.4–10.5)
Creatinine, Ser: 0.54 mg/dL (ref 0.50–1.10)
GLUCOSE: 373 mg/dL — AB (ref 70–99)
POTASSIUM: 3.9 meq/L (ref 3.7–5.3)
Sodium: 139 mEq/L (ref 137–147)
Total Protein: 7.4 g/dL (ref 6.0–8.3)

## 2014-06-23 LAB — URINE MICROSCOPIC-ADD ON

## 2014-06-23 LAB — CBC WITH DIFFERENTIAL/PLATELET
Basophils Absolute: 0 10*3/uL (ref 0.0–0.1)
Basophils Relative: 0 % (ref 0–1)
EOS ABS: 0.2 10*3/uL (ref 0.0–0.7)
Eosinophils Relative: 2 % (ref 0–5)
HCT: 39.8 % (ref 36.0–46.0)
Hemoglobin: 13.5 g/dL (ref 12.0–15.0)
LYMPHS ABS: 5.1 10*3/uL — AB (ref 0.7–4.0)
Lymphocytes Relative: 46 % (ref 12–46)
MCH: 30.1 pg (ref 26.0–34.0)
MCHC: 33.9 g/dL (ref 30.0–36.0)
MCV: 88.8 fL (ref 78.0–100.0)
MONOS PCT: 7 % (ref 3–12)
Monocytes Absolute: 0.7 10*3/uL (ref 0.1–1.0)
NEUTROS ABS: 4.9 10*3/uL (ref 1.7–7.7)
NEUTROS PCT: 45 % (ref 43–77)
Platelets: 304 10*3/uL (ref 150–400)
RBC: 4.48 MIL/uL (ref 3.87–5.11)
RDW: 12.5 % (ref 11.5–15.5)
WBC: 10.9 10*3/uL — ABNORMAL HIGH (ref 4.0–10.5)

## 2014-06-23 LAB — LIPASE, BLOOD: Lipase: 24 U/L (ref 11–59)

## 2014-06-23 LAB — CBG MONITORING, ED: Glucose-Capillary: 370 mg/dL — ABNORMAL HIGH (ref 70–99)

## 2014-06-23 LAB — I-STAT TROPONIN, ED: Troponin i, poc: 0 ng/mL (ref 0.00–0.08)

## 2014-06-23 MED ORDER — OXYCODONE-ACETAMINOPHEN 5-325 MG PO TABS: 1.0000 | ORAL_TABLET | Freq: Four times a day (QID) | ORAL | Status: DC | PRN

## 2014-06-23 MED ORDER — SODIUM CHLORIDE 0.9 % IV BOLUS (SEPSIS)
1000.0000 mL | Freq: Once | INTRAVENOUS | Status: AC
  Administered 2014-06-23: 1000 mL via INTRAVENOUS

## 2014-06-23 MED ORDER — MORPHINE SULFATE 4 MG/ML IJ SOLN
4.0000 mg | Freq: Once | INTRAMUSCULAR | Status: AC
  Administered 2014-06-23: 4 mg via INTRAVENOUS
  Filled 2014-06-23: qty 1

## 2014-06-23 MED ORDER — GI COCKTAIL ~~LOC~~
30.0000 mL | Freq: Once | ORAL | Status: AC
  Administered 2014-06-23: 30 mL via ORAL
  Filled 2014-06-23: qty 30

## 2014-06-23 MED ORDER — FLUCONAZOLE 150 MG PO TABS: 150.0000 mg | ORAL_TABLET | Freq: Once | ORAL | Status: DC

## 2014-06-23 MED ORDER — AZITHROMYCIN 250 MG PO TABS: ORAL_TABLET | ORAL | Status: DC

## 2014-06-23 NOTE — ED Notes (Signed)
Seen at [facility] yesterday for other complaint. Here this am with c/o abd pain. Pinpoints pain to mid upper abd pain. Onset 4 hrs ago. Woke pt up from sleep. (denies: sob, nvd, fever, bleeding, dizziness, back pain, CP or other sx), took ibuprofen at 0230. Not sure what it is r/t. No aggravating or aleviating factors.  Alert, NAD, calm, interactive.

## 2014-06-23 NOTE — ED Provider Notes (Signed)
Medical screening examination/treatment/procedure(s) were performed by a resident physician or non-physician practitioner and as the supervising physician I was immediately available for consultation/collaboration.  Lynne Leader, MD    Gregor Hams, MD 06/23/14 [phone]

## 2014-06-23 NOTE — ED Provider Notes (Signed)
CSN: [id number]     Arrival date & time 06/23/14  0536 History   First MD Initiated Contact with Patient 06/23/14 [phone]     Chief Complaint  Patient presents with  . Abdominal Pain     (Consider location/radiation/quality/duration/timing/severity/associated sxs/prior Treatment) HPI Comments: [name] is a 38 y.o. Female with a PMHx of morbid obesity, tachycardia, HLD, HTN, GERD, and a noncompliant diabetic, presenting today with stabbing  RUQ/epigastric abd pain that began suddenly at 2:30am (4 hrs PTA), moderate in severity, constant, non-radiating, and unrelieved by ibuprofen. No known aggravating or alleviating factors. States she ate a [place] sandwich at 8pm yesterday and then was awoken by this abd pain at 2:30am. Endorses that she was seen at Urgent care yesterday for sinusitis and given a dose of Doxycycline while she was there last night. Denies fevers, chills, HA, CP, SOB, N/V/D/C, hematuria, dysuria, urinary symptoms, vaginal discharge or bleeding, myalgias or arthralgias. Denies any recent fatty meals, travel, suspicious food intake, or changes in diet. No known sick contacts. Denies that this feels like her GERD, and that she has not been having any belching or reflux. Endorses that she is taking her Protonix as prescribed. States she has been taking her Byetta, Novolog, Lantus, and Metformin as prescribed over the last week, but hasn't been checking her sugars. Denies any possibility of pregnancy and had a Upreg yesterday at urgent care.  Patient is a 38 y.o. female presenting with abdominal pain. The history is provided by the patient. No language interpreter was used.  Abdominal Pain Pain location:  RUQ and epigastric Pain quality: stabbing   Pain radiates to:  Does not radiate Pain severity:  Moderate Onset quality:  Sudden Duration:  4 hours Timing:  Constant Progression:  Unchanged Chronicity:  New Context: awakening from sleep   Context: not alcohol use, not diet changes,  not eating, not recent illness, not recent sexual activity, not recent travel, not sick contacts and not suspicious food intake   Context comment:  Recently started Doxycycline Relieved by:  None tried Worsened by:  Nothing tried Ineffective treatments:  NSAIDs Associated symptoms: no anorexia, no belching, no chest pain, no chills, no constipation, no cough, no diarrhea, no dysuria, no fever, no hematemesis, no hematochezia, no hematuria, no melena, no nausea, no shortness of breath, no sore throat, no vaginal bleeding, no vaginal discharge and no vomiting   Risk factors: NSAID use and obesity     Past Medical History  Diagnosis Date  . Morbid obesity   . Alveolar hypoventilation   . Asthma   . GERD (gastroesophageal reflux disease)   . Rectal fissure   . Hypertension   . Costochondritis   . Cellulitis 08/2010-08/2011  . Depression   . Bipolar 2 disorder   . Obstructive sleep apnea   . Chest pain     NO CAD by cath 06/06/62, nl LV systolic fxn  . Diabetes mellitus 2000    Type 2, Uncontrolled  . Arthritis   . HLD (hyperlipidemia)   . Anemia   . COPD (chronic obstructive pulmonary disease)    Past Surgical History  Procedure Laterality Date  . Carpal tunnel release    . Bil knee surgery    . Mass excision N/A 06/29/2013    Procedure:  WIDE LOCAL EXCISION OF POSTERIOR NECK ABSCESS;  Surgeon: Ralene Ok, MD;  Location: [place];  Service: General;  Laterality: N/A;  . Cardiac catheterization     Family History  Problem Relation Age of  Onset  . Diabetes Mother   . Hyperlipidemia Mother   . Depression Mother   . Heart attack Paternal Uncle   . Heart disease Paternal Grandmother   . Heart attack Paternal Grandmother   . Heart attack Paternal Grandfather   . Heart disease Paternal Grandfather    History  Substance Use Topics  . Smoking status: Former Smoker -- 0.30 packs/day for .3 years    Types: Cigarettes    Quit date: 12/06/1993  . Smokeless tobacco: Never Used  .  Alcohol Use: No   OB History   Grav Para Term Preterm Abortions TAB SAB Ect Mult Living                 Review of Systems  Constitutional: Negative for fever and chills.  HENT: Positive for congestion (seen at [facility] yesterday for sinusitis). Negative for sore throat.   Respiratory: Negative for cough and shortness of breath.   Cardiovascular: Negative for chest pain.  Gastrointestinal: Positive for abdominal pain. Negative for nausea, vomiting, diarrhea, constipation, blood in stool, melena, hematochezia, anorexia and hematemesis.  Genitourinary: Negative for dysuria, urgency, hematuria, vaginal bleeding, vaginal discharge, difficulty urinating, vaginal pain and pelvic pain.  Musculoskeletal: Negative for back pain and myalgias.  Skin: Negative for color change.  Neurological: Negative for dizziness, weakness and headaches.  Psychiatric/Behavioral: Negative for confusion.  10 Systems reviewed and are negative for acute change except as noted in the HPI.     Allergies  Kiwi extract and Nubain  Home Medications   Prior to Admission medications   Medication Sig Start Date End Date Taking? Authorizing Provider  albuterol (PROVENTIL HFA;VENTOLIN HFA) 108 (90 BASE) MCG/ACT inhaler Inhale 1 puff into the lungs every 6 (six) hours as needed for wheezing or shortness of breath. 06/11/14  Yes [name], MD  atorvastatin (LIPITOR) 20 MG tablet Take 20 mg by mouth daily.   Yes Historical Provider, MD  budesonide-formoterol (SYMBICORT) 160-4.5 MCG/ACT inhaler Inhale 2 puffs into the lungs 2 (two) times daily.   Yes Historical Provider, MD  carvedilol (COREG) 12.5 MG tablet Take 12.5 mg by mouth 2 (two) times daily with a meal.   Yes Historical Provider, MD  cetirizine (ZYRTEC) 10 MG tablet Take 1 tablet (10 mg total) by mouth daily. 06/11/14  Yes [name], MD  doxycycline (VIBRAMYCIN) 100 MG capsule Take 1 capsule (100 mg total) by mouth 2 (two) times daily. 06/22/14  Yes Liam Graham, PA-C  exenatide (BYETTA) 5 MCG/0.02ML SOPN injection Inject 0.02 mLs (5 mcg total) into the skin 2 (two) times daily with a meal. 02/15/14  Yes [name], MD  fluticasone [facility]) 50 MCG/ACT nasal spray Place 2 sprays into both nostrils daily. 05/21/14  Yes Melony Overly, MD  HYDROcodone-acetaminophen (NORCO) 5-325 MG per tablet Take 1 tablet by mouth every 6 (six) hours as needed for moderate pain. 06/22/14  Yes Freeman Caldron Baker, PA-C  insulin aspart (NOVOLOG) 100 UNIT/ML injection Inject 60 Units into the skin 3 (three) times daily with meals. 03/12/14  Yes [name], MD  Insulin Glargine (LANTUS) 100 UNIT/ML Solostar Pen Inject 150 Units into the skin 2 (two) times daily. 07/30/13  Yes [name], MD  lisinopril (PRINIVIL,ZESTRIL) 10 MG tablet Take 1 tablet (10 mg total) by mouth daily. 03/12/14  Yes [name], MD  metFORMIN (GLUCOPHAGE) 500 MG tablet Take 500 mg by mouth 2 (two) times daily with a meal.   Yes Historical Provider,  MD  nitroGLYCERIN (NITROSTAT) 0.4 MG SL tablet Place 1 tablet (0.4 mg total) under the tongue every 5 (five) minutes as needed for chest pain.   Yes [name], MD  pantoprazole (PROTONIX) 40 MG tablet Take 40 mg by mouth daily.   Yes Historical Provider, MD  traMADol (ULTRAM) 50 MG tablet Take 1 tablet (50 mg total) by mouth daily as needed for moderate pain. 06/11/14  Yes [name], MD  azithromycin (ZITHROMAX Z-PAK) 250 MG tablet 2 po day one, then 1 daily x 4 days 06/23/14   Patty Sermons Camprubi-Soms, PA-C  fluconazole (DIFLUCAN) 150 MG tablet Take 1 tablet (150 mg total) by mouth once. 06/23/14   Shawna Kiener Strupp Camprubi-Soms, PA-C  oxyCODONE-acetaminophen (PERCOCET) 5-325 MG per tablet Take 1-2 tablets by mouth every 6 (six) hours as needed for severe pain. 06/23/14   Lyra Alaimo Strupp Camprubi-Soms, PA-C   BP 131/83  Pulse 102  Temp(Src) 98.2 F (36.8 C) (Oral)  Resp 20  Ht '5\' 2"'  (1.575 m)  Wt 325 lb  (147.419 kg)  BMI 59.43 kg/m2  SpO2 97%  LMP 05/22/2014 Physical Exam  Nursing note and vitals reviewed. Constitutional: She is oriented to person, place, and time. Vital signs are normal. She appears well-developed and well-nourished. No distress.  Afebrile, obese caucasian female sitting up in bed, calm and cooperative, nontoxic  HENT:  Head: Normocephalic and atraumatic.  Mouth/Throat: Oropharynx is clear and moist and mucous membranes are normal.  Eyes: Conjunctivae and EOM are normal. Right eye exhibits no discharge. Left eye exhibits no discharge.  Neck: Normal range of motion. Neck supple.  Cardiovascular: Normal rate, regular rhythm, normal heart sounds and intact distal pulses.   No murmur heard. Pulmonary/Chest: Effort normal and breath sounds normal. She has no wheezes. She has no rales.  Abdominal: Soft. Bowel sounds are normal. She exhibits no distension. There is tenderness in the right upper quadrant and epigastric area. There is positive Murphy's sign. There is no rigidity, no rebound, no guarding, no CVA tenderness and no tenderness at McBurney's point.  Exam limited due to body habitus. Obese abdomen, soft, nondistended, with +BS throughout. TTP in RUQ and epigastrum, relatively positive Murphy's sign although this is limited due to body habitus. No rebound/guarding/rigidity. Neg McBurney's point TTP. No CVA ttp. All other quadrants non-TTP  Musculoskeletal: Normal range of motion.  Neurological: She is alert and oriented to person, place, and time.  Skin: Skin is warm, dry and intact. No rash noted.  Psychiatric: She has a normal mood and affect.    ED Course  Procedures (including critical care time) Labs Review Labs Reviewed  CBC WITH DIFFERENTIAL - Abnormal; Notable for the following:    WBC 10.9 (*)    Lymphs Abs 5.1 (*)    All other components within normal limits  COMPREHENSIVE METABOLIC PANEL - Abnormal; Notable for the following:    Glucose, Bld 373 (*)     Albumin 2.7 (*)    Alkaline Phosphatase 151 (*)    Total Bilirubin <0.2 (*)    All other components within normal limits  URINALYSIS, ROUTINE W REFLEX MICROSCOPIC - Abnormal; Notable for the following:    Glucose, UA >1000 (*)    Protein, ur 30 (*)    All other components within normal limits  CBG MONITORING, ED - Abnormal; Notable for the following:    Glucose-Capillary 370 (*)    All other components within normal limits  LIPASE, BLOOD  URINE MICROSCOPIC-ADD ON  I-STAT TROPOININ,  ED    Imaging Review US Abdomen Complete  06/23/2014   CLINICAL DATA:  Right upper quadrant abdominal pain.  EXAM: ULTRASOUND ABDOMEN COMPLETE  COMPARISON:  CT, 12/03/2013.  FINDINGS: Gallbladder:  Small gallstones. No gallbladder wall thickening. No pericholecystic fluid. No evidence of acute cholecystitis.  Common bile duct:  Diameter: 3.5 mm  Liver:  Liver is echogenic and mildly enlarged. No liver mass or focal lesion.  IVC:  No abnormality visualized.  Pancreas:  Incompletely imaged.  Visualized portions are unremarkable.  Spleen:  Size and appearance within normal limits.  Right Kidney:  Length: 13.3 cm. Echogenicity within normal limits. No mass or hydronephrosis visualized.  Left Kidney:  Length: 14.3 cm. Echogenicity within normal limits. No mass or hydronephrosis visualized.  Abdominal aorta:  No aneurysm visualized.  Other findings:  None.  IMPRESSION: 1. Cholelithiasis.  No evidence of acute cholecystitis. 2. No acute findings. 3. Hepatomegaly and diffuse hepatic steatosis.   Electronically Signed   By: Lajean Manes M.D.   On: 06/23/2014 08:02     EKG Interpretation   Date/Time:  Sunday June 23 2014 06:14:02 EDT Ventricular Rate:  102 PR Interval:  162 QRS Duration: 101 QT Interval:  371 QTC Calculation: 483 R Axis:   92 Text Interpretation:  Sinus tachycardia Low voltage with right axis  deviation No significant change was found Confirmed by [facility]  MD, TREY  ([id number]) on 06/23/2014 7:21:13 AM       MDM   Final diagnoses:  Right upper quadrant pain  Acute sinusitis, recurrence not specified, unspecified location  Yeast infection of the vagina  Hyperglycemia due to type 2 diabetes mellitus   [name] is a 38 y.o. female with a PMHx of morbid obesity, tachycardia, HLD, HTN, GERD, and a noncompliant diabetic, presenting with RUQ/epigastric pain after receiving doxycycline yesterday at urgent care for her sinusitis. Pt with relatively positive Murphy's although body habitus limits exam. Given that she fits the four F's of gallbladder disease, will u/s gallbladder. Basic labs obtained, start fluids, give morphine. Upreg done less than 12 hours ago at urgent care, therefore will not obtain this here today. No pelvic pain or vaginal symptoms therefore will not obtain pelvic. Will reassess after U/S.  8:30 AM Pt feeling some relief with morphine and fluids. CBC showing mildly bumped WBC at 10.9 which is unconcerning for infection at this time given that there are no peritoneal signs. Gluc 373, which is consistent with past CBGs for this noncompliant pt. Alk phos bumped at 151 with LFTs and bili WNL. Low clinical suspicion for ascending cholangitis or any other liver dysfunction at this time. Lipase WNL. U/S showing cholelithiasis but no evidence of cholecystitis, as well as hepatomegaly and hepatic steatosis. I believe this patient's abd pain is related to biliary colic as well as recent doxycycline on an empty stomach. I discussed with pt that many of her abnormal findings are related to her weight, and that she needed to manage her DM better. Discussed that her gallbladder is not infected or inflammed today, but that in the future it may have an issue related to the stones that are in there, and that she may eventually need to see a surgeon for removal. Awaiting U/A, pt requesting PO fluids to help make her urinate. Will redose some morphine now, for improved pain control, and await U/A results.    10:20 AM Pt feeling some improvement with pain, tolerating PO fluids. U/A showing glucosuria and yeast but no signs of UTI.  Pt with no pelvic pain on exam, do not feel pelvic is required at this time. Discussed again with pt that yeast is a result of high CBG and urged pt to f/up with PCP to get better control of her DM. At this time, I believe the abd pain is related to her doxycycline use yesterday, as well as biliary colic and fatty liver. Pt remaining afebrile, and abd exam benign. Will switch doxy to azithromycin for her sinus infection. Rx for percocet given for pain. Rx for diflucan. F/up with PCP this week to discuss referral to surgeon for cholecystectomy as well as f/up on DM/sinus infection/yeast infection. I explained the diagnosis and have given explicit precautions to return to the ER including for any other new or worsening symptoms. The patient understands and accepts the medical plan as it's been dictated and I have answered their questions. Discharge instructions concerning home care and prescriptions have been given. The patient is STABLE and is discharged to home in good condition.  BP 131/83  Pulse 102  Temp(Src) 98.2 F (36.8 C) (Oral)  Resp 20  Ht '5\' 2"'  (1.575 m)  Wt 325 lb (147.419 kg)  BMI 59.43 kg/m2  SpO2 97%  LMP 05/22/2014   Quinnlan Abruzzo Strupp Camprubi-Soms, PA-C 06/23/14 1023

## 2014-06-23 NOTE — Discharge Instructions (Signed)
Abdominal (belly) pain can be caused by many things. I believe yours was related to doxycycline, so we switched your sinus infection antibiotic to Azithromycin. You were also found to have a yeast infection, so you were given Diflucan. Continue taking your normal home medications, and monitor your blood sugar. See your primary care doctor for follow up on these issues, including referral to a surgeon for possible gall bladder removal.   Your caregiver performed an examination and possibly ordered blood/urine tests and imaging (CT scan, x-rays, ultrasound). Many cases can be observed and treated at home after initial evaluation in the emergency department. Even though you are being discharged home, abdominal pain can be unpredictable. Therefore, you need a repeated exam if your pain does not resolve, returns, or worsens. Most patients with abdominal pain don't have to be admitted to the hospital or have surgery, but serious problems like appendicitis and gallbladder attacks can start out as nonspecific pain. Many abdominal conditions cannot be diagnosed in one visit, so follow-up evaluations are very important.  SEEK IMMEDIATE MEDICAL ATTENTION IF YOU DEVELOP ANY OF THE FOLLOWING SYMPTOMS:  The pain does not go away or becomes severe.   A temperature above 101 develops.   Repeated vomiting occurs (multiple episodes).   The pain becomes localized to portions of the abdomen. The right side could possibly be appendicitis. In an adult, the left lower portion of the abdomen could be colitis or diverticulitis.   Blood is being passed in stools or vomit (bright red or black tarry stools).   Return also if you develop chest pain, difficulty breathing, dizziness or fainting, or become confused, poorly responsive, or inconsolable (young children).  The constipation stays for more than 4 days.   There is belly (abdominal) or rectal pain.   You do not seem to be getting better.   Abdominal Pain,  Women Abdominal (stomach, pelvic, or belly) pain can be caused by many things. It is important to tell your doctor:  The location of the pain.  Does it come and go or is it present all the time?  Are there things that start the pain (eating certain foods, exercise)?  Are there other symptoms associated with the pain (fever, nausea, vomiting, diarrhea)? All of this is helpful to know when trying to find the cause of the pain. CAUSES   Stomach: virus or bacteria infection, or ulcer.  Intestine: appendicitis (inflamed appendix), regional ileitis (Crohn's disease), ulcerative colitis (inflamed colon), irritable bowel syndrome, diverticulitis (inflamed diverticulum of the colon), or cancer of the stomach or intestine.  Gallbladder disease or stones in the gallbladder.  Kidney disease, kidney stones, or infection.  Pancreas infection or cancer.  Fibromyalgia (pain disorder).  Diseases of the female organs:  Uterus: fibroid (non-cancerous) tumors or infection.  Fallopian tubes: infection or tubal pregnancy.  Ovary: cysts or tumors.  Pelvic adhesions (scar tissue).  Endometriosis (uterus lining tissue growing in the pelvis and on the pelvic organs).  Pelvic congestion syndrome (female organs filling up with blood just before the menstrual period).  Pain with the menstrual period.  Pain with ovulation (producing an egg).  Pain with an IUD (intrauterine device, birth control) in the uterus.  Cancer of the female organs.  Functional pain (pain not caused by a disease, may improve without treatment).  Psychological pain.  Depression. DIAGNOSIS  Your doctor will decide the seriousness of your pain by doing an examination.  Blood tests.  X-rays.  Ultrasound.  CT scan (computed tomography, special type of  X-ray).  MRI (magnetic resonance imaging).  Cultures, for infection.  Barium enema (dye inserted in the large intestine, to better view it with  X-rays).  Colonoscopy (looking in intestine with a lighted tube).  Laparoscopy (minor surgery, looking in abdomen with a lighted tube).  Major abdominal exploratory surgery (looking in abdomen with a large incision). TREATMENT  The treatment will depend on the cause of the pain.   Many cases can be observed and treated at home.  Over-the-counter medicines recommended by your caregiver.  Prescription medicine.  Antibiotics, for infection.  Birth control pills, for painful periods or for ovulation pain.  Hormone treatment, for endometriosis.  Nerve blocking injections.  Physical therapy.  Antidepressants.  Counseling with a psychologist or psychiatrist.  Minor or major surgery. HOME CARE INSTRUCTIONS   Do not take laxatives, unless directed by your caregiver.  Take over-the-counter pain medicine only if ordered by your caregiver. Do not take aspirin because it can cause an upset stomach or bleeding.  Try a clear liquid diet (broth or water) as ordered by your caregiver. Slowly move to a bland diet, as tolerated, if the pain is related to the stomach or intestine.  Have a thermometer and take your temperature several times a day, and record it.  Bed rest and sleep, if it helps the pain.  Avoid sexual intercourse, if it causes pain.  Avoid stressful situations.  Keep your follow-up appointments and tests, as your caregiver orders.  If the pain does not go away with medicine or surgery, you may try:  Acupuncture.  Relaxation exercises (yoga, meditation).  Group therapy.  Counseling. SEEK MEDICAL CARE IF:   You notice certain foods cause stomach pain.  Your home care treatment is not helping your pain.  You need stronger pain medicine.  You want your IUD removed.  You feel faint or lightheaded.  You develop nausea and vomiting.  You develop a rash.  You are having side effects or an allergy to your medicine. SEEK IMMEDIATE MEDICAL CARE IF:   Your  pain does not go away or gets worse.  You have a fever.  Your pain is felt only in portions of the abdomen. The right side could possibly be appendicitis. The left lower portion of the abdomen could be colitis or diverticulitis.  You are passing blood in your stools (bright red or black tarry stools, with or without vomiting).  You have blood in your urine.  You develop chills, with or without a fever.  You pass out. MAKE SURE YOU:   Understand these instructions.  Will watch your condition.  Will get help right away if you are not doing well or get worse. Document Released: 09/19/2007 Document Revised: 02/14/2012 Document Reviewed: 10/09/2009 [facility] Patient Information 2015 [place], [place]. This information is not intended to replace advice given to you by your health care provider. Make sure you discuss any questions you have with your health care provider.  Candidal Vulvovaginitis Candidal vulvovaginitis is an infection of the vagina and vulva. The vulva is the skin around the opening of the vagina. This may cause itching and discomfort in and around the vagina.  HOME CARE  Only take medicine as told by your doctor.  Do not have sex (intercourse) until the infection is healed or as told by your doctor.  Practice safe sex.  Tell your sex partner about your infection.  Do not douche or use tampons.  Wear cotton underwear. Do not wear tight pants or panty hose.  Eat  yogurt. This may help treat and prevent yeast infections. GET HELP RIGHT AWAY IF:   You have a fever.  Your problems get worse during treatment or do not get better in 3 days.  You have discomfort, irritation, or itching in your vagina or vulva area.  You have pain after sex.  You start to get belly (abdominal) pain. MAKE SURE YOU:  Understand these instructions.  Will watch your condition.  Will get help right away if you are not doing well or get worse. Document Released: 02/18/2009 Document  Revised: 11/27/2013 Document Reviewed: 02/18/2009 [facility] Patient Information 2015 [place], [place]. This information is not intended to replace advice given to you by your health care provider. Make sure you discuss any questions you have with your health care provider.  Sinusitis Sinusitis is redness, soreness, and swelling (inflammation) of the paranasal sinuses. Paranasal sinuses are air pockets within the bones of your face (beneath the eyes, the middle of the forehead, or above the eyes). In healthy paranasal sinuses, mucus is able to drain out, and air is able to circulate through them by way of your nose. However, when your paranasal sinuses are inflamed, mucus and air can become trapped. This can allow bacteria and other germs to grow and cause infection. Sinusitis can develop quickly and last only a short time (acute) or continue over a long period (chronic). Sinusitis that lasts for more than 12 weeks is considered chronic.  CAUSES  Causes of sinusitis include:  Allergies.  Structural abnormalities, such as displacement of the cartilage that separates your nostrils (deviated septum), which can decrease the air flow through your nose and sinuses and affect sinus drainage.  Functional abnormalities, such as when the small hairs (cilia) that line your sinuses and help remove mucus do not work properly or are not present. SYMPTOMS  Symptoms of acute and chronic sinusitis are the same. The primary symptoms are pain and pressure around the affected sinuses. Other symptoms include:  Upper toothache.  Earache.  Headache.  Bad breath.  Decreased sense of smell and taste.  A cough, which worsens when you are lying flat.  Fatigue.  Fever.  Thick drainage from your nose, which often is green and may contain pus (purulent).  Swelling and warmth over the affected sinuses. DIAGNOSIS  Your caregiver will perform a physical exam. During the exam, your caregiver may:  Look in your  nose for signs of abnormal growths in your nostrils (nasal polyps).  Tap over the affected sinus to check for signs of infection.  View the inside of your sinuses (endoscopy) with a special imaging device with a light attached (endoscope), which is inserted into your sinuses. If your caregiver suspects that you have chronic sinusitis, one or more of the following tests may be recommended:  Allergy tests.  Nasal culture--A sample of mucus is taken from your nose and sent to a lab and screened for bacteria.  Nasal cytology--A sample of mucus is taken from your nose and examined by your caregiver to determine if your sinusitis is related to an allergy. TREATMENT  Most cases of acute sinusitis are related to a viral infection and will resolve on their own within 10 days. Sometimes medicines are prescribed to help relieve symptoms (pain medicine, decongestants, nasal steroid sprays, or saline sprays).  However, for sinusitis related to a bacterial infection, your caregiver will prescribe antibiotic medicines. These are medicines that will help kill the bacteria causing the infection.  Rarely, sinusitis is caused by a fungal  infection. In theses cases, your caregiver will prescribe antifungal medicine. For some cases of chronic sinusitis, surgery is needed. Generally, these are cases in which sinusitis recurs more than 3 times per year, despite other treatments. HOME CARE INSTRUCTIONS   Drink plenty of water. Water helps thin the mucus so your sinuses can drain more easily.  Use a humidifier.  Inhale steam 3 to 4 times a day (for example, sit in the bathroom with the shower running).  Apply a warm, moist washcloth to your face 3 to 4 times a day, or as directed by your caregiver.  Use saline nasal sprays to help moisten and clean your sinuses.  Take over-the-counter or prescription medicines for pain, discomfort, or fever only as directed by your caregiver. SEEK IMMEDIATE MEDICAL CARE  IF:  You have increasing pain or severe headaches.  You have nausea, vomiting, or drowsiness.  You have swelling around your face.  You have vision problems.  You have a stiff neck.  You have difficulty breathing. MAKE SURE YOU:   Understand these instructions.  Will watch your condition.  Will get help right away if you are not doing well or get worse. Document Released: 11/22/2005 Document Revised: 02/14/2012 Document Reviewed: 12/07/2011 [facility] Patient Information 2015 [place], [place]. This information is not intended to replace advice given to you by your health care provider. Make sure you discuss any questions you have with your health care provider.  Biliary Colic  Biliary colic is a steady or irregular pain in the upper abdomen. It is usually under the right side of the rib cage. It happens when gallstones interfere with the normal flow of bile from the gallbladder. Bile is a liquid that helps to digest fats. Bile is made in the liver and stored in the gallbladder. When you eat a meal, bile passes from the gallbladder through the cystic duct and the common bile duct into the small intestine. There, it mixes with partially digested food. If a gallstone blocks either of these ducts, the normal flow of bile is blocked. The muscle cells in the bile duct contract forcefully to try to move the stone. This causes the pain of biliary colic.  SYMPTOMS   A person with biliary colic usually complains of pain in the upper abdomen. This pain can be:  In the center of the upper abdomen just below the breastbone.  In the upper-right part of the abdomen, near the gallbladder and liver.  Spread back toward the right shoulder blade.  Nausea and vomiting.  The pain usually occurs after eating.  Biliary colic is usually triggered by the digestive system's demand for bile. The demand for bile is high after fatty meals. Symptoms can also occur when a person who has been fasting suddenly  eats a very large meal. Most episodes of biliary colic pass after 1 to 5 hours. After the most intense pain passes, your abdomen may continue to ache mildly for about 24 hours. DIAGNOSIS  After you describe your symptoms, your caregiver will perform a physical exam. He or she will pay attention to the upper right portion of your belly (abdomen). This is the area of your liver and gallbladder. An ultrasound will help your caregiver look for gallstones. Specialized scans of the gallbladder may also be done. Blood tests may be done, especially if you have fever or if your pain persists. PREVENTION  Biliary colic can be prevented by controlling the risk factors for gallstones. Some of these risk factors, such as heredity,  increasing age, and pregnancy are a normal part of life. Obesity and a high-fat diet are risk factors you can change through a healthy lifestyle. Women going through menopause who take hormone replacement therapy (estrogen) are also more likely to develop biliary colic. TREATMENT   Pain medication may be prescribed.  You may be encouraged to eat a fat-free diet.  If the first episode of biliary colic is severe, or episodes of colic keep retuning, surgery to remove the gallbladder (cholecystectomy) is usually recommended. This procedure can be done through small incisions using an instrument called a laparoscope. The procedure often requires a brief stay in the hospital. Some people can leave the hospital the same day. It is the most widely used treatment in people troubled by painful gallstones. It is effective and safe, with no complications in more than 90% of cases.  If surgery cannot be done, medication that dissolves gallstones may be used. This medication is expensive and can take months or years to work. Only small stones will dissolve.  Rarely, medication to dissolve gallstones is combined with a procedure called shock-wave lithotripsy. This procedure uses carefully aimed shock  waves to break up gallstones. In many people treated with this procedure, gallstones form again within a few years. PROGNOSIS  If gallstones block your cystic duct or common bile duct, you are at risk for repeated episodes of biliary colic. There is also a 25% chance that you will develop a gallbladder infection(acute cholecystitis), or some other complication of gallstones within 10 to 20 years. If you have surgery, schedule it at a time that is convenient for you and at a time when you are not sick. HOME CARE INSTRUCTIONS   Drink plenty of clear fluids.  Avoid fatty, greasy or fried foods, or any foods that make your pain worse.  Take medications as directed. SEEK MEDICAL CARE IF:   You develop a fever over 100.5 F (38.1 C).  Your pain gets worse over time.  You develop nausea that prevents you from eating and drinking.  You develop vomiting. SEEK IMMEDIATE MEDICAL CARE IF:   You have continuous or severe belly (abdominal) pain which is not relieved with medications.  You develop nausea and vomiting which is not relieved with medications.  You have symptoms of biliary colic and you suddenly develop a fever and shaking chills. This may signal cholecystitis. Call your caregiver immediately.  You develop a yellow color to your skin or the white part of your eyes (jaundice). Document Released: 04/25/2006 Document Revised: 02/14/2012 Document Reviewed: 07/04/2008 [facility] Patient Information 2015 [place], [place]. This information is not intended to replace advice given to you by your health care provider. Make sure you discuss any questions you have with your health care provider.

## 2014-06-25 ENCOUNTER — Ambulatory Visit: Payer: Medicaid Other | Admitting: Endocrinology

## 2014-06-26 ENCOUNTER — Telehealth: Payer: Self-pay | Admitting: Family Medicine

## 2014-06-26 NOTE — Telephone Encounter (Signed)
Forward to PCP for Rx.Busick, Kevin Fenton

## 2014-06-26 NOTE — Telephone Encounter (Signed)
Pt called and needs a handwritten prescription for a blood pressure cuff. The mail order she uses does not fill that type of prescriptions she will have to take it to a supply store here. jw

## 2014-06-27 ENCOUNTER — Emergency Department (HOSPITAL_COMMUNITY)
Admission: EM | Admit: 2014-06-27 | Discharge: 2014-06-27 | Disposition: A | Discharge status: Home / Self Care | Payer: Medicaid Other | Attending: Emergency Medicine | Admitting: Emergency Medicine

## 2014-06-27 ENCOUNTER — Encounter (HOSPITAL_COMMUNITY): Payer: Self-pay | Admitting: Emergency Medicine

## 2014-06-27 DIAGNOSIS — Z8669 Personal history of other diseases of the nervous system and sense organs: Secondary | ICD-10-CM | POA: Diagnosis not present

## 2014-06-27 DIAGNOSIS — M129 Arthropathy, unspecified: Secondary | ICD-10-CM | POA: Diagnosis not present

## 2014-06-27 DIAGNOSIS — F3289 Other specified depressive episodes: Secondary | ICD-10-CM | POA: Insufficient documentation

## 2014-06-27 DIAGNOSIS — J0110 Acute frontal sinusitis, unspecified: Secondary | ICD-10-CM | POA: Insufficient documentation

## 2014-06-27 DIAGNOSIS — E119 Type 2 diabetes mellitus without complications: Secondary | ICD-10-CM | POA: Insufficient documentation

## 2014-06-27 DIAGNOSIS — F329 Major depressive disorder, single episode, unspecified: Secondary | ICD-10-CM | POA: Diagnosis not present

## 2014-06-27 DIAGNOSIS — J449 Chronic obstructive pulmonary disease, unspecified: Secondary | ICD-10-CM | POA: Insufficient documentation

## 2014-06-27 DIAGNOSIS — Z862 Personal history of diseases of the blood and blood-forming organs and certain disorders involving the immune mechanism: Secondary | ICD-10-CM | POA: Diagnosis not present

## 2014-06-27 DIAGNOSIS — Z792 Long term (current) use of antibiotics: Secondary | ICD-10-CM | POA: Diagnosis not present

## 2014-06-27 DIAGNOSIS — Z79899 Other long term (current) drug therapy: Secondary | ICD-10-CM | POA: Diagnosis not present

## 2014-06-27 DIAGNOSIS — Z872 Personal history of diseases of the skin and subcutaneous tissue: Secondary | ICD-10-CM | POA: Insufficient documentation

## 2014-06-27 DIAGNOSIS — IMO0002 Reserved for concepts with insufficient information to code with codable children: Secondary | ICD-10-CM | POA: Diagnosis not present

## 2014-06-27 DIAGNOSIS — Z791 Long term (current) use of non-steroidal anti-inflammatories (NSAID): Secondary | ICD-10-CM | POA: Insufficient documentation

## 2014-06-27 DIAGNOSIS — Z87891 Personal history of nicotine dependence: Secondary | ICD-10-CM | POA: Diagnosis not present

## 2014-06-27 DIAGNOSIS — J4489 Other specified chronic obstructive pulmonary disease: Secondary | ICD-10-CM | POA: Insufficient documentation

## 2014-06-27 DIAGNOSIS — I10 Essential (primary) hypertension: Secondary | ICD-10-CM | POA: Diagnosis not present

## 2014-06-27 DIAGNOSIS — Z9889 Other specified postprocedural states: Secondary | ICD-10-CM | POA: Diagnosis not present

## 2014-06-27 DIAGNOSIS — E785 Hyperlipidemia, unspecified: Secondary | ICD-10-CM | POA: Insufficient documentation

## 2014-06-27 DIAGNOSIS — Z794 Long term (current) use of insulin: Secondary | ICD-10-CM | POA: Insufficient documentation

## 2014-06-27 DIAGNOSIS — K219 Gastro-esophageal reflux disease without esophagitis: Secondary | ICD-10-CM | POA: Diagnosis not present

## 2014-06-27 DIAGNOSIS — J3489 Other specified disorders of nose and nasal sinuses: Secondary | ICD-10-CM | POA: Diagnosis present

## 2014-06-27 MED ORDER — TRAMADOL HCL 50 MG PO TABS: 50.0000 mg | ORAL_TABLET | Freq: Once | ORAL | Status: DC

## 2014-06-27 MED ORDER — MEDROXYPROGESTERONE ACETATE 10 MG PO TABS: 10.0000 mg | ORAL_TABLET | Freq: Every day | ORAL | Status: DC

## 2014-06-27 MED ORDER — TRAMADOL HCL 50 MG PO TABS: 50.0000 mg | ORAL_TABLET | Freq: Four times a day (QID) | ORAL | Status: DC | PRN

## 2014-06-27 MED ORDER — LORATADINE 10 MG PO TABS: 10.0000 mg | ORAL_TABLET | Freq: Every day | ORAL | Status: DC

## 2014-06-27 MED ORDER — OXYCODONE-ACETAMINOPHEN 5-325 MG PO TABS
2.0000 | ORAL_TABLET | Freq: Once | ORAL | Status: AC
  Administered 2014-06-27: 2 via ORAL
  Filled 2014-06-27: qty 2

## 2014-06-27 NOTE — Discharge Instructions (Signed)

## 2014-06-27 NOTE — ED Provider Notes (Signed)
CSN: [id number]     Arrival date & time 06/27/14  0305 History   First MD Initiated Contact with Patient 06/27/14 0440     Chief Complaint  Patient presents with  . Nasal Congestion     (Consider location/radiation/quality/duration/timing/severity/associated sxs/prior Treatment) HPI  This patient is a morbidly obese woman with multiple chronic medical problems who presents with complaints of persistent nasal congestion for 5 days. She says that she is on day 3 of 5 of a azithromycin course but has not had improvement in her symptoms. She has pain at the bridge of her nose. She says that this interferes with sleeping. She has not been febrile. She has yellowish nasal mucus discharge. No fever. No sore throat respiratory symptoms. She has not been using any decongestant. His primary care physician whom she has not consulted regarding her illness.  Past Medical History  Diagnosis Date  . Morbid obesity   . Alveolar hypoventilation   . Asthma   . GERD (gastroesophageal reflux disease)   . Rectal fissure   . Hypertension   . Costochondritis   . Cellulitis 08/2010-08/2011  . Depression   . Bipolar 2 disorder   . Obstructive sleep apnea   . Chest pain     NO CAD by cath 6/96/78, nl LV systolic fxn  . Diabetes mellitus 2000    Type 2, Uncontrolled  . Arthritis   . HLD (hyperlipidemia)   . Anemia   . COPD (chronic obstructive pulmonary disease)    Past Surgical History  Procedure Laterality Date  . Carpal tunnel release    . Bil knee surgery    . Mass excision N/A 06/29/2013    Procedure:  WIDE LOCAL EXCISION OF POSTERIOR NECK ABSCESS;  Surgeon: Ralene Ok, MD;  Location: [facility];  Service: General;  Laterality: N/A;  . Cardiac catheterization     Family History  Problem Relation Age of Onset  . Diabetes Mother   . Hyperlipidemia Mother   . Depression Mother   . Heart attack Paternal Uncle   . Heart disease Paternal Grandmother   . Heart attack Paternal Grandmother   .  Heart attack Paternal Grandfather   . Heart disease Paternal Grandfather    History  Substance Use Topics  . Smoking status: Former Smoker -- 0.30 packs/day for .3 years    Types: Cigarettes    Quit date: 12/06/1993  . Smokeless tobacco: Never Used  . Alcohol Use: No   OB History   Grav Para Term Preterm Abortions TAB SAB Ect Mult Living                 Review of Systems  Ten point review of symptoms performed and is negative with the exception of symptoms noted above.   Allergies  Kiwi extract and Nubain  Home Medications   Prior to Admission medications   Medication Sig Start Date End Date Taking? Authorizing Provider  albuterol (PROVENTIL HFA;VENTOLIN HFA) 108 (90 BASE) MCG/ACT inhaler Inhale 1 puff into the lungs every 6 (six) hours as needed for wheezing or shortness of breath. 06/11/14  Yes [name], MD  atorvastatin (LIPITOR) 20 MG tablet Take 20 mg by mouth daily.   Yes Historical Provider, MD  azithromycin (ZITHROMAX Z-PAK) 250 MG tablet Take 250-500 mg by mouth daily. Take 2 tablets by mouth on day 1, then 1 tablet daily for days 2 to 5 06/25/04 06/29/14 Yes Historical Provider, MD  budesonide-formoterol (SYMBICORT) 160-4.5 MCG/ACT inhaler Inhale 2 puffs into the  lungs 2 (two) times daily.   Yes Historical Provider, MD  carvedilol (COREG) 12.5 MG tablet Take 12.5 mg by mouth 2 (two) times daily with a meal.   Yes Historical Provider, MD  cetirizine (ZYRTEC) 10 MG tablet Take 1 tablet (10 mg total) by mouth daily. 06/11/14  Yes [name], MD  exenatide (BYETTA) 5 MCG/0.02ML SOPN injection Inject 0.02 mLs (5 mcg total) into the skin 2 (two) times daily with a meal. 02/15/14  Yes [name], MD  fluticasone [facility]) 50 MCG/ACT nasal spray Place 2 sprays into both nostrils daily. 05/21/14  Yes Melony Overly, MD  HYDROcodone-acetaminophen (NORCO) 10-325 MG per tablet Take 1 tablet by mouth 3 (three) times daily as needed for moderate pain.   Yes  Historical Provider, MD  ibuprofen (ADVIL,MOTRIN) 600 MG tablet Take 600 mg by mouth every 6 (six) hours as needed for mild pain or moderate pain.   Yes Historical Provider, MD  insulin aspart (NOVOLOG) 100 UNIT/ML injection Inject 60 Units into the skin 3 (three) times daily with meals. 03/12/14  Yes [name], MD  Insulin Glargine (LANTUS) 100 UNIT/ML Solostar Pen Inject 150 Units into the skin 2 (two) times daily. 07/30/13  Yes [name], MD  lisinopril (PRINIVIL,ZESTRIL) 10 MG tablet Take 1 tablet (10 mg total) by mouth daily. 03/12/14  Yes [name], MD  metFORMIN (GLUCOPHAGE) 500 MG tablet Take 500 mg by mouth 2 (two) times daily with a meal.   Yes Historical Provider, MD  pantoprazole (PROTONIX) 40 MG tablet Take 40 mg by mouth daily.   Yes Historical Provider, MD  traMADol (ULTRAM) 50 MG tablet Take 1 tablet (50 mg total) by mouth daily as needed for moderate pain. 06/11/14  Yes [name], MD  loratadine (CLARITIN) 10 MG tablet Take 1 tablet (10 mg total) by mouth daily. 06/27/14   Elyn Peers, MD  medroxyPROGESTERone (PROVERA) 10 MG tablet Take 1 tablet (10 mg total) by mouth daily. 06/27/14   Elyn Peers, MD  nitroGLYCERIN (NITROSTAT) 0.4 MG SL tablet Place 1 tablet (0.4 mg total) under the tongue every 5 (five) minutes as needed for chest pain.    [name], MD  traMADol (ULTRAM) 50 MG tablet Take 1 tablet (50 mg total) by mouth every 6 (six) hours as needed. 06/27/14   Elyn Peers, MD   BP 132/88  Pulse 100  Temp(Src) 97 F (36.1 C) (Oral)  Resp 16  Ht 5\' 2"  (1.575 m)  Wt 322 lb (146.058 kg)  BMI 58.88 kg/m2  SpO2 96%  LMP 05/22/2014 Physical Exam Gen: well developed and well nourished appearing Head: NCAT Eyes: PERL, EOMI Nose: no epistaixis or rhinorrhea Mouth/throat: mucosa is moist and pink Neck: supple, no stridor Lungs: CTA B, no wheezing, rhonchi or rales CV: RRR, no murmur, extremities appear well perfused.  Abd: soft,  notender, nondistended Back: no ttp, no cva ttp Skin: warm and dry Ext: normal to inspection, no dependent edema Neuro: CN ii-xii grossly intact, no focal deficits Psyche; normal affect,  calm and cooperative.  ED Course  Procedures (including critical care time) Labs Review   MDM   Final diagnoses:  Acute frontal sinusitis, recurrence not specified   Tx with decongesant. Humidifier recommended. Tramadol for pain. Outpatient f/u with PCP.     Elyn Peers, MD 06/27/14 [phone]

## 2014-06-27 NOTE — ED Notes (Signed)
Pt presents with pain to her nose x1 week. Pt states increase pain with anything to "sit on her nose." pt seen at [facility] and diagnosed with a sinus infection and developed abd discomfort after taking the first ABX. Pt was then prescribed a different ABX which pt states she started 2 days ago.  Pt states she woke up with this am with increase nose pain.

## 2014-06-27 NOTE — ED Notes (Signed)
Pt. reports nasal congestion and runny nose for 1 week , occasional dry cough.

## 2014-06-27 NOTE — ED Provider Notes (Signed)
Medical screening examination/treatment/procedure(s) were performed by non-physician practitioner and as supervising physician I was immediately available for consultation/collaboration.   EKG Interpretation   Date/Time:  Sunday June 23 2014 06:14:02 EDT Ventricular Rate:  102 PR Interval:  162 QRS Duration: 101 QT Interval:  371 QTC Calculation: 483 R Axis:   92 Text Interpretation:  Sinus tachycardia Low voltage with right axis  deviation No significant change was found Confirmed by [facility]  MD, TREY  ([id number]) on 06/23/2014 7:21:13 AM        Houston Siren III, MD 06/27/14 [phone]

## 2014-06-28 ENCOUNTER — Ambulatory Visit (INDEPENDENT_AMBULATORY_CARE_PROVIDER_SITE_OTHER): Payer: Medicaid Other | Admitting: Family Medicine

## 2014-06-28 ENCOUNTER — Encounter: Payer: Self-pay | Admitting: Family Medicine

## 2014-06-28 VITALS — BP 138/92 | HR 107 | Temp 98.2°F | Wt 323.0 lb

## 2014-06-28 DIAGNOSIS — J019 Acute sinusitis, unspecified: Secondary | ICD-10-CM

## 2014-06-28 MED ORDER — AMOXICILLIN-POT CLAVULANATE 875-125 MG PO TABS: 1.0000 | ORAL_TABLET | Freq: Two times a day (BID) | ORAL | Status: DC

## 2014-06-28 MED ORDER — BLOOD PRESSURE CUFF MISC: Status: DC

## 2014-06-28 NOTE — Telephone Encounter (Signed)
Gave printed rx to pt during her visit today. [name], MD

## 2014-06-28 NOTE — Patient Instructions (Signed)
Stop azithromycin Start augmentin, I sent this in for you. Do nasal saline spray as often as you think about it I will see you next week for follow up. If you aren't better then we may need to get a CT scan of your face If worsening or fevers over the weekend go to the ER  Be well, Dr. [name]   Sinusitis Sinusitis is redness, soreness, and inflammation of the paranasal sinuses. Paranasal sinuses are air pockets within the bones of your face (beneath the eyes, the middle of the forehead, or above the eyes). In healthy paranasal sinuses, mucus is able to drain out, and air is able to circulate through them by way of your nose. However, when your paranasal sinuses are inflamed, mucus and air can become trapped. This can allow bacteria and other germs to grow and cause infection. Sinusitis can develop quickly and last only a short time (acute) or continue over a long period (chronic). Sinusitis that lasts for more than 12 weeks is considered chronic.  CAUSES  Causes of sinusitis include:  Allergies.  Structural abnormalities, such as displacement of the cartilage that separates your nostrils (deviated septum), which can decrease the air flow through your nose and sinuses and affect sinus drainage.  Functional abnormalities, such as when the small hairs (cilia) that line your sinuses and help remove mucus do not work properly or are not present. SIGNS AND SYMPTOMS  Symptoms of acute and chronic sinusitis are the same. The primary symptoms are pain and pressure around the affected sinuses. Other symptoms include:  Upper toothache.  Earache.  Headache.  Bad breath.  Decreased sense of smell and taste.  A cough, which worsens when you are lying flat.  Fatigue.  Fever.  Thick drainage from your nose, which often is green and may contain pus (purulent).  Swelling and warmth over the affected sinuses. DIAGNOSIS  Your health care provider will perform a physical exam. During the  exam, your health care provider may:  Look in your nose for signs of abnormal growths in your nostrils (nasal polyps).  Tap over the affected sinus to check for signs of infection.  View the inside of your sinuses (endoscopy) using an imaging device that has a light attached (endoscope). If your health care provider suspects that you have chronic sinusitis, one or more of the following tests may be recommended:  Allergy tests.  Nasal culture. A sample of mucus is taken from your nose, sent to a lab, and screened for bacteria.  Nasal cytology. A sample of mucus is taken from your nose and examined by your health care provider to determine if your sinusitis is related to an allergy. TREATMENT  Most cases of acute sinusitis are related to a viral infection and will resolve on their own within 10 days. Sometimes medicines are prescribed to help relieve symptoms (pain medicine, decongestants, nasal steroid sprays, or saline sprays).  However, for sinusitis related to a bacterial infection, your health care provider will prescribe antibiotic medicines. These are medicines that will help kill the bacteria causing the infection.  Rarely, sinusitis is caused by a fungal infection. In theses cases, your health care provider will prescribe antifungal medicine. For some cases of chronic sinusitis, surgery is needed. Generally, these are cases in which sinusitis recurs more than 3 times per year, despite other treatments. HOME CARE INSTRUCTIONS   Drink plenty of water. Water helps thin the mucus so your sinuses can drain more easily.  Use a humidifier.  Inhale  steam 3 to 4 times a day (for example, sit in the bathroom with the shower running).  Apply a warm, moist washcloth to your face 3 to 4 times a day, or as directed by your health care provider.  Use saline nasal sprays to help moisten and clean your sinuses.  Take medicines only as directed by your health care provider.  If you were  prescribed either an antibiotic or antifungal medicine, finish it all even if you start to feel better. SEEK IMMEDIATE MEDICAL CARE IF:  You have increasing pain or severe headaches.  You have nausea, vomiting, or drowsiness.  You have swelling around your face.  You have vision problems.  You have a stiff neck.  You have difficulty breathing. MAKE SURE YOU:   Understand these instructions.  Will watch your condition.  Will get help right away if you are not doing well or get worse. Document Released: 11/22/2005 Document Revised: 04/08/2014 Document Reviewed: 12/07/2011 [facility] Patient Information 2015 [place], [place]. This information is not intended to replace advice given to you by your health care provider. Make sure you discuss any questions you have with your health care provider.

## 2014-07-01 ENCOUNTER — Other Ambulatory Visit: Payer: Self-pay | Admitting: Family Medicine

## 2014-07-01 NOTE — Progress Notes (Signed)
Patient ID: [name], female   DOB: [birth year], 38 y.o.   MRN: [id number]  HPI:  Pt presents for a same day appointment to discuss sinus pain.  Nose has been hurting her for 2 weeks. Seen at Urgent care on 7/18 and given rx for doxycycline for presumed bacterial sinusitis. Seen at ER on  7/19 with abdominal pain, stopped doxcycline because thought causing abdominal pain, switched to azithromycin. Then seen again on 7/23 in ER for persistent pain, no additional workup done. She now presents for further evaluation.  Nose continues to hurt badly, right in the bridge of her nose in between her eyes. No vision changes, fevers, headache. Previously had decreased sense of smell but this has improved. Has occasional cough which is nonproductive. Feels like sinuses are congested.  ROS: See HPI  [place]: hx poorly controlled T2DM, morbid obesity, chronic pain on chronic narcotics, OSA, OHS, HLD, GERD, asthma  PHYSICAL EXAM: BP 138/92  Pulse 107  Temp(Src) 98.2 F (36.8 C) (Oral)  Wt 323 lb (146.512 kg)  LMP 05/22/2014 Gen: NAD HEENT: NCAT, MMM no oral exudates, no sinus tenderness, nasal turbinates appear irritated, no septal hematomas. PERRL, EOMI. Heart: RRR no murmurs Lungs: CTAB, NWOB Neuro: grossly nonfocal, speech normal  ASSESSMENT/PLAN:  # Nasal pain: likely related to bacterial sinusitis. Currently about to finish course of azithromycin (had to stop doxycycline). Discussed with pt that for true bacterial sinusitis first like tx is augmentin. Will switch antibiotic to augmentin. If no improvement with this change, will likely need to obtain CT scan of pt's face to rule out other more severe causes of her pain. Pt is agreeable to this plan. She has a follow up appt already scheduled with me next week.  FOLLOW UP: F/u in 1 week for nasal pain.  [place]. [name], [place]

## 2014-07-03 ENCOUNTER — Ambulatory Visit (INDEPENDENT_AMBULATORY_CARE_PROVIDER_SITE_OTHER): Payer: Medicaid Other | Admitting: Family Medicine

## 2014-07-03 ENCOUNTER — Encounter: Payer: Self-pay | Admitting: Family Medicine

## 2014-07-03 VITALS — BP 158/118 | HR 111 | Temp 98.2°F | Ht 62.0 in | Wt 321.8 lb

## 2014-07-03 DIAGNOSIS — Z7189 Other specified counseling: Secondary | ICD-10-CM

## 2014-07-03 DIAGNOSIS — J019 Acute sinusitis, unspecified: Secondary | ICD-10-CM

## 2014-07-03 DIAGNOSIS — G8929 Other chronic pain: Secondary | ICD-10-CM

## 2014-07-03 DIAGNOSIS — M79609 Pain in unspecified limb: Secondary | ICD-10-CM

## 2014-07-03 DIAGNOSIS — M79672 Pain in left foot: Secondary | ICD-10-CM

## 2014-07-03 MED ORDER — HYDROCODONE-ACETAMINOPHEN 10-325 MG PO TABS: 1.0000 | ORAL_TABLET | Freq: Three times a day (TID) | ORAL | Status: DC | PRN

## 2014-07-03 MED ORDER — FLUCONAZOLE 150 MG PO TABS: 150.0000 mg | ORAL_TABLET | Freq: Once | ORAL | Status: DC

## 2014-07-03 MED ORDER — TRAMADOL HCL 50 MG PO TABS: 50.0000 mg | ORAL_TABLET | Freq: Every day | ORAL | Status: DC | PRN

## 2014-07-03 NOTE — Assessment & Plan Note (Signed)
Will re-order referral to general surgery for gastric bypass discussion.

## 2014-07-03 NOTE — Patient Instructions (Addendum)
I sent in diflucan to [facility] Refilled pain medicine x 2 months We are decreasing tramadol to 15 pills per month, take daily as needed Try ice on your foot, also wear supportive shoes.  Please follow up with your endocrinologist for diabetes. I will refer you again to general surgery to discuss gastric bypass.  If nose not continuing to feel better next week please call and schedule an appointment.  See you on the 13th.  Be well, Dr. [name]

## 2014-07-03 NOTE — Progress Notes (Signed)
Patient ID: Gwendolen Hewlett, female   DOB: 04-07-1976, 38 y.o.   MRN: [id number]  HPI:  Chronic pain: currently taking norco 10-325mg  TID. Also takes tramadol QHS. Pain is well controlled with this medicine. Allows her to walk to help lose weight. Willing to cut back on tramadol quantity today.  Referral for gastric bypass: requests another referral for surgery. Had one previously, but they thought it was to follow up on her prior neck cyst so they did not address weight loss surgery.  Nose pain: nasal pain improving. Has been taking augmentin BID but states she doesn't want to take it because it is giving her a bad yeast infection. Willing to continue taking it if I prescribe diflucan.  Foot pain: has had pain in her left foot over the 4th/5th metatarsals for the last several days. Doesn't hurt all the time, just sometimes. Has not tried icing the foot. Wears flip flops all the time.  ROS: See HPI.  [place]: hx poorly controlled T2DM, morbid obesity, chronic pain on chronic narcotics, OSA, OHS, HLD, GERD, asthma  PHYSICAL EXAM: BP 158/118  Pulse 111  Temp(Src) 98.2 F (36.8 C) (Oral)  Ht 5\' 2"  (1.575 m)  Wt 321 lb 12.8 oz (145.968 kg)  BMI 58.84 kg/m2  LMP 05/22/2014 Gen: NAD HEENT: NCAT, facial sinuses nontender to palpation, nose nontender to palpation Neuro: grossly nonfocal speech normal Ext: knees without effusion although weight limits exam. L foot has old scars present on dorsal surface, no erythema or signs of active infection. nontender to palpation. Full strength with toes and foot movements. Normal sensation of foot.  ASSESSMENT/PLAN:  Sinusitis/Nasal pain: improving. Encouraged pt to continue taking augmentin to complete the course. Will rx diflucan for vaginal yeast infection.  Foot pain - etiology not entirely clear. No obvious injury or trauma on exam. Advised ibuprofen, icing the foot. Follow up if not improving.  Morbid obesity Will re-order referral to general surgery  for gastric bypass discussion.  Encounter for chronic pain management Well controlled. Has successfully weaned off flexeril. Refilled norco x 2 months. Reducing tramadol # to 15 per month. F/u in 2 months.   FOLLOW UP: F/u in 2 weeks for other concerns not discussed today (has appt scheduled for August 13).  [place]. [name], [place]

## 2014-07-03 NOTE — Assessment & Plan Note (Signed)
Well controlled. Has successfully weaned off flexeril. Refilled norco x 2 months. Reducing tramadol # to 15 per month. F/u in 2 months.

## 2014-07-04 ENCOUNTER — Other Ambulatory Visit: Payer: Self-pay | Admitting: Family Medicine

## 2014-07-09 ENCOUNTER — Other Ambulatory Visit: Payer: Self-pay | Admitting: *Deleted

## 2014-07-09 MED ORDER — NITROGLYCERIN 0.4 MG SL SUBL: 0.4000 mg | SUBLINGUAL_TABLET | SUBLINGUAL | Status: DC | PRN

## 2014-07-12 ENCOUNTER — Emergency Department (HOSPITAL_COMMUNITY): Payer: Medicaid Other

## 2014-07-12 ENCOUNTER — Inpatient Hospital Stay (HOSPITAL_COMMUNITY): Admission: AD | Admit: 2014-07-12 | Payer: Medicaid Other | Source: Ambulatory Visit | Admitting: Family Medicine

## 2014-07-12 ENCOUNTER — Inpatient Hospital Stay (HOSPITAL_COMMUNITY)
Admission: EM | Admit: 2014-07-12 | Discharge: 2014-07-14 | DRG: 103 | Disposition: A | Discharge status: Home / Self Care | Payer: Medicaid Other | Attending: Family Medicine | Admitting: Family Medicine

## 2014-07-12 ENCOUNTER — Ambulatory Visit (INDEPENDENT_AMBULATORY_CARE_PROVIDER_SITE_OTHER): Payer: Medicaid Other | Admitting: Family Medicine

## 2014-07-12 ENCOUNTER — Encounter (HOSPITAL_COMMUNITY): Payer: Self-pay | Admitting: Emergency Medicine

## 2014-07-12 ENCOUNTER — Encounter: Payer: Self-pay | Admitting: Family Medicine

## 2014-07-12 VITALS — BP 135/91 | HR 97 | Temp 98.1°F | Ht 62.0 in | Wt 321.5 lb

## 2014-07-12 DIAGNOSIS — G8929 Other chronic pain: Secondary | ICD-10-CM | POA: Diagnosis present

## 2014-07-12 DIAGNOSIS — E1140 Type 2 diabetes mellitus with diabetic neuropathy, unspecified: Secondary | ICD-10-CM | POA: Diagnosis present

## 2014-07-12 DIAGNOSIS — E678 Other specified hyperalimentation: Secondary | ICD-10-CM | POA: Diagnosis present

## 2014-07-12 DIAGNOSIS — Z794 Long term (current) use of insulin: Secondary | ICD-10-CM | POA: Diagnosis not present

## 2014-07-12 DIAGNOSIS — J4489 Other specified chronic obstructive pulmonary disease: Secondary | ICD-10-CM | POA: Diagnosis present

## 2014-07-12 DIAGNOSIS — G4700 Insomnia, unspecified: Secondary | ICD-10-CM

## 2014-07-12 DIAGNOSIS — G4733 Obstructive sleep apnea (adult) (pediatric): Secondary | ICD-10-CM | POA: Diagnosis present

## 2014-07-12 DIAGNOSIS — E785 Hyperlipidemia, unspecified: Secondary | ICD-10-CM | POA: Diagnosis present

## 2014-07-12 DIAGNOSIS — M25561 Pain in right knee: Secondary | ICD-10-CM

## 2014-07-12 DIAGNOSIS — H53139 Sudden visual loss, unspecified eye: Secondary | ICD-10-CM | POA: Insufficient documentation

## 2014-07-12 DIAGNOSIS — H5460 Unqualified visual loss, one eye, unspecified: Secondary | ICD-10-CM | POA: Diagnosis present

## 2014-07-12 DIAGNOSIS — F3189 Other bipolar disorder: Secondary | ICD-10-CM | POA: Diagnosis present

## 2014-07-12 DIAGNOSIS — R05 Cough: Secondary | ICD-10-CM

## 2014-07-12 DIAGNOSIS — H5710 Ocular pain, unspecified eye: Secondary | ICD-10-CM | POA: Diagnosis present

## 2014-07-12 DIAGNOSIS — H53133 Sudden visual loss, bilateral: Secondary | ICD-10-CM

## 2014-07-12 DIAGNOSIS — E1149 Type 2 diabetes mellitus with other diabetic neurological complication: Secondary | ICD-10-CM | POA: Diagnosis present

## 2014-07-12 DIAGNOSIS — R059 Cough, unspecified: Secondary | ICD-10-CM

## 2014-07-12 DIAGNOSIS — E119 Type 2 diabetes mellitus without complications: Secondary | ICD-10-CM | POA: Diagnosis present

## 2014-07-12 DIAGNOSIS — Z79899 Other long term (current) drug therapy: Secondary | ICD-10-CM | POA: Diagnosis not present

## 2014-07-12 DIAGNOSIS — F329 Major depressive disorder, single episode, unspecified: Secondary | ICD-10-CM

## 2014-07-12 DIAGNOSIS — Z87891 Personal history of nicotine dependence: Secondary | ICD-10-CM

## 2014-07-12 DIAGNOSIS — G43909 Migraine, unspecified, not intractable, without status migrainosus: Secondary | ICD-10-CM | POA: Diagnosis not present

## 2014-07-12 DIAGNOSIS — M25562 Pain in left knee: Secondary | ICD-10-CM

## 2014-07-12 DIAGNOSIS — J449 Chronic obstructive pulmonary disease, unspecified: Secondary | ICD-10-CM | POA: Diagnosis present

## 2014-07-12 DIAGNOSIS — IMO0001 Reserved for inherently not codable concepts without codable children: Secondary | ICD-10-CM

## 2014-07-12 DIAGNOSIS — K219 Gastro-esophageal reflux disease without esophagitis: Secondary | ICD-10-CM | POA: Diagnosis present

## 2014-07-12 DIAGNOSIS — E1165 Type 2 diabetes mellitus with hyperglycemia: Secondary | ICD-10-CM

## 2014-07-12 DIAGNOSIS — F3289 Other specified depressive episodes: Secondary | ICD-10-CM

## 2014-07-12 DIAGNOSIS — IMO0002 Reserved for concepts with insufficient information to code with codable children: Secondary | ICD-10-CM

## 2014-07-12 DIAGNOSIS — E1151 Type 2 diabetes mellitus with diabetic peripheral angiopathy without gangrene: Secondary | ICD-10-CM | POA: Diagnosis present

## 2014-07-12 DIAGNOSIS — E662 Morbid (severe) obesity with alveolar hypoventilation: Secondary | ICD-10-CM | POA: Diagnosis present

## 2014-07-12 DIAGNOSIS — H53131 Sudden visual loss, right eye: Secondary | ICD-10-CM

## 2014-07-12 DIAGNOSIS — J45909 Unspecified asthma, uncomplicated: Secondary | ICD-10-CM | POA: Diagnosis present

## 2014-07-12 DIAGNOSIS — I10 Essential (primary) hypertension: Secondary | ICD-10-CM | POA: Diagnosis present

## 2014-07-12 LAB — BASIC METABOLIC PANEL
Anion gap: 12 (ref 5–15)
BUN: 8 mg/dL (ref 6–23)
CO2: 26 mEq/L (ref 19–32)
Calcium: 8.6 mg/dL (ref 8.4–10.5)
Chloride: 99 mEq/L (ref 96–112)
Creatinine, Ser: 0.39 mg/dL — ABNORMAL LOW (ref 0.50–1.10)
GFR calc Af Amer: >90 mL/min (ref >90)
Glucose, Bld: 276 mg/dL — ABNORMAL HIGH (ref 70–99)
Potassium: 4.1 mEq/L (ref 3.7–5.3)
SODIUM: 137 meq/L (ref 137–147)

## 2014-07-12 LAB — CBC
HCT: 41 % (ref 36.0–46.0)
Hemoglobin: 13.9 g/dL (ref 12.0–15.0)
MCH: 30.3 pg (ref 26.0–34.0)
MCHC: 33.9 g/dL (ref 30.0–36.0)
MCV: 89.3 fL (ref 78.0–100.0)
Platelets: 288 10*3/uL (ref 150–400)
RBC: 4.59 MIL/uL (ref 3.87–5.11)
RDW: 12.6 % (ref 11.5–15.5)
WBC: 9.1 10*3/uL (ref 4.0–10.5)

## 2014-07-12 LAB — GLUCOSE, CAPILLARY: Glucose-Capillary: 400 mg/dL — ABNORMAL HIGH (ref 70–99)

## 2014-07-12 LAB — POC URINE PREG, ED: Preg Test, Ur: NEGATIVE

## 2014-07-12 MED ORDER — ALBUTEROL SULFATE (2.5 MG/3ML) 0.083% IN NEBU: 2.5000 mg | INHALATION_SOLUTION | Freq: Four times a day (QID) | RESPIRATORY_TRACT | Status: DC | PRN

## 2014-07-12 MED ORDER — SODIUM CHLORIDE 0.9 % IV BOLUS (SEPSIS)
1000.0000 mL | Freq: Once | INTRAVENOUS | Status: AC
  Administered 2014-07-12: 1000 mL via INTRAVENOUS

## 2014-07-12 MED ORDER — ATORVASTATIN CALCIUM 10 MG PO TABS: 20.0000 mg | ORAL_TABLET | Freq: Every day | ORAL | Status: DC

## 2014-07-12 MED ORDER — INSULIN ASPART 100 UNIT/ML ~~LOC~~ SOLN
0.0000 [IU] | Freq: Three times a day (TID) | SUBCUTANEOUS | Status: DC
  Administered 2014-07-13: 15 [IU] via SUBCUTANEOUS
  Administered 2014-07-13: 7 [IU] via SUBCUTANEOUS
  Administered 2014-07-13 – 2014-07-14 (×2): 11 [IU] via SUBCUTANEOUS
  Administered 2014-07-14: 7 [IU] via SUBCUTANEOUS

## 2014-07-12 MED ORDER — SODIUM CHLORIDE 0.9 % IV SOLN
INTRAVENOUS | Status: DC
  Administered 2014-07-12: 10 mL via INTRAVENOUS

## 2014-07-12 MED ORDER — MORPHINE SULFATE 2 MG/ML IJ SOLN
2.0000 mg | INTRAMUSCULAR | Status: DC | PRN
  Administered 2014-07-12: 2 mg via INTRAVENOUS
  Filled 2014-07-12: qty 1

## 2014-07-12 MED ORDER — ONDANSETRON HCL 4 MG/2ML IJ SOLN
4.0000 mg | Freq: Four times a day (QID) | INTRAMUSCULAR | Status: DC | PRN
  Administered 2014-07-13: 4 mg via INTRAVENOUS
  Filled 2014-07-12: qty 2

## 2014-07-12 MED ORDER — MORPHINE SULFATE 4 MG/ML IJ SOLN
4.0000 mg | INTRAMUSCULAR | Status: DC | PRN
  Administered 2014-07-13 (×2): 4 mg via INTRAVENOUS
  Filled 2014-07-12 (×2): qty 1

## 2014-07-12 MED ORDER — INSULIN ASPART 100 UNIT/ML ~~LOC~~ SOLN
0.0000 [IU] | Freq: Every day | SUBCUTANEOUS | Status: DC
  Administered 2014-07-13: 4 [IU] via SUBCUTANEOUS

## 2014-07-12 MED ORDER — ENOXAPARIN SODIUM 80 MG/0.8ML ~~LOC~~ SOLN
75.0000 mg | Freq: Every day | SUBCUTANEOUS | Status: DC
  Administered 2014-07-12 – 2014-07-13 (×2): 75 mg via SUBCUTANEOUS
  Filled 2014-07-12 (×2): qty 0.8

## 2014-07-12 MED ORDER — LISINOPRIL 10 MG PO TABS
10.0000 mg | ORAL_TABLET | Freq: Every day | ORAL | Status: DC
  Administered 2014-07-13 – 2014-07-14 (×2): 10 mg via ORAL
  Filled 2014-07-12 (×2): qty 1

## 2014-07-12 MED ORDER — CARVEDILOL 12.5 MG PO TABS
12.5000 mg | ORAL_TABLET | Freq: Two times a day (BID) | ORAL | Status: DC
  Administered 2014-07-14: 12.5 mg via ORAL
  Filled 2014-07-12 (×3): qty 1

## 2014-07-12 MED ORDER — PANTOPRAZOLE SODIUM 40 MG PO TBEC
40.0000 mg | DELAYED_RELEASE_TABLET | Freq: Every day | ORAL | Status: DC
  Administered 2014-07-13 – 2014-07-14 (×2): 40 mg via ORAL
  Filled 2014-07-12 (×2): qty 1

## 2014-07-12 MED ORDER — LORATADINE 10 MG PO TABS
10.0000 mg | ORAL_TABLET | Freq: Every day | ORAL | Status: DC
  Administered 2014-07-13 – 2014-07-14 (×2): 10 mg via ORAL
  Filled 2014-07-12 (×2): qty 1

## 2014-07-12 MED ORDER — INSULIN GLARGINE 100 UNIT/ML ~~LOC~~ SOLN
150.0000 [IU] | Freq: Two times a day (BID) | SUBCUTANEOUS | Status: DC
  Administered 2014-07-12 – 2014-07-14 (×4): 150 [IU] via SUBCUTANEOUS
  Filled 2014-07-12 (×5): qty 1.5

## 2014-07-12 MED ORDER — IOHEXOL 350 MG/ML SOLN
80.0000 mL | Freq: Once | INTRAVENOUS | Status: AC | PRN
  Administered 2014-07-12: 80 mL via INTRAVENOUS

## 2014-07-12 MED ORDER — EXENATIDE 5 MCG/0.02ML ~~LOC~~ SOPN: 5.0000 ug | PEN_INJECTOR | Freq: Two times a day (BID) | SUBCUTANEOUS | Status: DC

## 2014-07-12 MED ORDER — ACETAMINOPHEN 325 MG PO TABS: 650.0000 mg | ORAL_TABLET | Freq: Four times a day (QID) | ORAL | Status: DC | PRN

## 2014-07-12 MED ORDER — INSULIN ASPART 100 UNIT/ML ~~LOC~~ SOLN
15.0000 [IU] | Freq: Once | SUBCUTANEOUS | Status: AC
  Administered 2014-07-13: 15 [IU] via SUBCUTANEOUS

## 2014-07-12 MED ORDER — ONDANSETRON HCL 4 MG/2ML IJ SOLN
4.0000 mg | Freq: Once | INTRAMUSCULAR | Status: AC
  Administered 2014-07-12: 4 mg via INTRAVENOUS

## 2014-07-12 MED ORDER — ONDANSETRON HCL 4 MG PO TABS: 4.0000 mg | ORAL_TABLET | Freq: Four times a day (QID) | ORAL | Status: DC | PRN

## 2014-07-12 MED ORDER — DOCUSATE SODIUM 100 MG PO CAPS
100.0000 mg | ORAL_CAPSULE | Freq: Two times a day (BID) | ORAL | Status: DC
  Administered 2014-07-12 – 2014-07-14 (×4): 100 mg via ORAL
  Filled 2014-07-12 (×4): qty 1

## 2014-07-12 MED ORDER — SODIUM CHLORIDE 0.9 % IJ SOLN
3.0000 mL | Freq: Two times a day (BID) | INTRAMUSCULAR | Status: DC
  Administered 2014-07-12: 3 mL via INTRAVENOUS

## 2014-07-12 MED ORDER — BUDESONIDE-FORMOTEROL FUMARATE 160-4.5 MCG/ACT IN AERO
2.0000 | INHALATION_SPRAY | Freq: Two times a day (BID) | RESPIRATORY_TRACT | Status: DC
  Administered 2014-07-12 – 2014-07-14 (×4): 2 via RESPIRATORY_TRACT
  Filled 2014-07-12: qty 6

## 2014-07-12 NOTE — ED Notes (Signed)
Pt sts she was at eye doctor tuesdays for annual check up and sts doctor dilated eyes. Pt sts last night around 9pm vision in R eye went black for 45 minutes and noted to have HA 8/10. Pt went back to eye doctor today and had eye dilation done again and patient had vision go black for 10 mintues. Pt currently has blurred vision in R eye and HA.

## 2014-07-12 NOTE — Progress Notes (Signed)
   Subjective:    Patient ID: [name], female    DOB: 29-Jun-1976, 38 y.o.   MRN: [id number]  HPI Comments: [name] comes in today for evaluation of right eye pain.  She reports having dilated diabetic eye exam on Tuesday, then, she began having sharp right eye yesterday , followed by complete loss of vision in right eye for 45 minutes yesterday.  The pain persisted.  This morning, followed by an additional 25 minutes of vision loss.  Reports going back to her ophthalmologist this morning, and says another dilated eye exam was performed however, the ophthalmologist did not find anything wrong with her eye.  She reports mild right-sided headache in addition to the eye pain.  Currently she denies any vision problems with either eye.  She denies any history of migraines.  She denies any speech problems, facial droop, unilateral weakness.  She denies any fevers or chills.  She was told she did not have diabetic retinopathy or glaucoma at her ophthalmologist visit, but was told "she needed to see a neurologist."   Review of Systems See HPI     Objective:   Physical Exam  Constitutional: She is oriented to person, place, and time. She appears well-developed and well-nourished.  Eyes: Conjunctivae and EOM are normal. Pupils are equal, round, and reactive to light. Right eye exhibits no discharge. Left eye exhibits no discharge.  Neck:  No carotid bruits   Cardiovascular: Normal rate, regular rhythm and normal heart sounds.   No murmur heard. Pulmonary/Chest: Effort normal and breath sounds normal.  Neurological: She is alert and oriented to person, place, and time. No cranial nerve deficit.  Gross sensory and strength intact    Assessment/Plan:      See Problem Focused Assessment & Plan

## 2014-07-12 NOTE — ED Notes (Signed)
Patient transported to CT 

## 2014-07-12 NOTE — Progress Notes (Signed)
Attempted report [id number]. Will keep calling until answer.

## 2014-07-12 NOTE — H&P (Signed)
[facility] Admission History and Physical Service Pager: [phone]  Patient name: [name] Medical record number: [id number] Date of birth: 03/19/1976 Age: 38 y.o. Gender: female  Primary Care Provider: [name], MD Consultants: neurology Code Status: full code  Chief Complaint: right eye pain and intermittent vision loss  Assessment and Plan: Jerlyn Pain is a 38 y.o. female presenting with intermittent right eye vision loss for 2-3 days and right eye pain. PMH is significant for poorly-controled DM on insulin and Byetta, HTN, HLD, morbid obesity, OSA/OHS, GERD, asthma, and chronic pain. CTA shows no definite cause for vision loss; neurologist recommends ESR (possible though unlikely temporal arteritis) and MRI of brain and orbits. DDx broad, including stroke (unlikely given no other neuro symptoms), TIA, MS (though pt has no family history or other neuro symptoms), infectious causes (no elevated WBC or febrile illness, no redness / swelling to suggest cellulitis).  #Right eye vision loss / pain - uncertain cause with broad differential as above. - ESR and MRI ordered to eval for inflammatory and / or organic brain / nerve issues - may consider discussion with outpt ophthalmologist, but at this point no definite eye-specific pathology identified - appreciate neurology consultation and will follow up further recs - Tylenol PO and morphine IV PRN for pain; plan to transition to PO medications as tolerated  #Diabetes - last A1c recently >14, on Byetta 5 mcg BID, Lantus 150 BID and Novolog 60 TID with meals at home, as well as metformin - holding metformin after dye load for CTA - ordered for CBG's qAC+HS, Lantus and Byetta at home dosing, and Novolog resistant sliding scale - holding off on meal-time insulin for now, though expect pt's insulin requirement may need to be adjusted upwards - s/p one-time dose of 15 units Novolog for CBG 400 shortly after  admission; BMP without evidence for DKA  #HTN - intermittently elevated, on lisinopril and Coreg at home - continue home meds; consider hydralazine PRN if needed  #HLD - continue home Lipitor 20 mg daily  #Asthma / OSH / OSA - continue home albuterol PRN, scheduled Symbicort, night-time CPAP  #Seasonal allergies - Claritin for home Zyrtec  #GERD - continue home Protonix  FEN/GI: carb-modified / heart healthy diet, KVO IVF Prophylaxis: home Protonix, Lovenox  Disposition: admit to telemetry unit, management as above, attending Dr. Gwendlyn Deutscher  History of Present Illness: [name] is a 38 y.o. female presenting with intermittent right eye vision loss for 2-3 days and right eye pain. PMH is significant for poorly-controled DM on insulin and Byetta, HTN, HLD, morbid obesity, OSA/OHS, GERD, asthma, and chronic pain. Pt has been seen by her outpt ophthalmologist twice this week (was seen early in the week with dilated exam, then had sudden right-eye vision loss last night and again today twice; saw her ophthalmologist again today who noted no glaucoma, retinal detachment, or cataracts but recommended she see her cardiologist due to "abnormal veins" possibly due to HTN). She called her cardiologist who recommended seeing neurology; she was seen at her PCP's office, who told her to come to the ED for stroke work-up. Neurology was contacted prior to her coming to the ED (Dr. Nicole Kindred, who saw her in the ED; see his note).   She has no frank headache but pain in her right eye, worse with movement, that radiates up into her forehead. She has taken no specific mediations for these issues. Her vision is currently "there" (meaning she can see out of her  right eye), but she has some blurriness s/p dilated eye exam earlier today. She denies fever / chills, N/V, abdominal pain, change in bowel habits, numbness / tingling, difficulty walking, change in appetite, chest pain, SOB, or limb swelling.  Review Of  Systems: Per HPI. Otherwise 12 point review of systems was performed and was unremarkable.  Patient Active Problem List   Diagnosis Date Noted  . Vision, loss, sudden 07/12/2014  . Middle ear effusion 05/21/2014  . Otitis externa 05/21/2014  . Cough 05/13/2014  . Right foot sprain 04/16/2014  . Elbow pain, right 01/07/2014  . Urge incontinence 10/15/2013  . Insomnia 08/14/2013  . Encounter for chronic pain management 06/30/2013  . Low back pain 01/31/2013  . HLD (hyperlipidemia) 11/19/2012  . Chest pain 06/27/2012  . Epigastric pain 06/22/2012  . Right carpal tunnel syndrome 09/01/2011  . Bilateral knee pain 09/01/2011  . DM (diabetes mellitus), type 2, uncontrolled 05/22/2008  . Morbid obesity 05/22/2008  . 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 DEPRESSION 05/22/2008  . OBSTRUCTIVE SLEEP APNEA 05/22/2008  . HYPERTENSION 05/22/2008  . ASTHMA 05/22/2008  . GERD 05/22/2008   Past Medical History: Past Medical History  Diagnosis Date  . Morbid obesity   . Alveolar hypoventilation   . Asthma   . GERD (gastroesophageal reflux disease)   . Rectal fissure   . Hypertension   . Costochondritis   . Cellulitis 08/2010-08/2011  . Depression   . Bipolar 2 disorder   . Obstructive sleep apnea   . Chest pain     NO CAD by cath 6/78/93, nl LV systolic fxn  . Diabetes mellitus 2000    Type 2, Uncontrolled  . Arthritis   . HLD (hyperlipidemia)   . Anemia   . COPD (chronic obstructive pulmonary disease)    Past Surgical History: Past Surgical History  Procedure Laterality Date  . Carpal tunnel release    . Bil knee surgery    . Mass excision N/A 06/29/2013    Procedure:  WIDE LOCAL EXCISION OF POSTERIOR NECK ABSCESS;  Surgeon: Ralene Ok, MD;  Location: [facility];  Service: General;  Laterality: N/A;  . Cardiac catheterization     Social History: History  Substance Use Topics  . Smoking status: Former Smoker -- 0.30 packs/day for .3 years    Types: Cigarettes     Quit date: 12/06/1993  . Smokeless tobacco: Never Used  . Alcohol Use: No   Please also refer to relevant sections of EMR.  Family History: Family History  Problem Relation Age of Onset  . Diabetes Mother   . Hyperlipidemia Mother   . Depression Mother   . Heart attack Paternal Uncle   . Heart disease Paternal Grandmother   . Heart attack Paternal Grandmother   . Heart attack Paternal Grandfather   . Heart disease Paternal Grandfather    Allergies and Medications: Allergies  Allergen Reactions  . Kiwi Extract Anaphylaxis  . Nubain [Nalbuphine Hcl] Other (See Comments)    Nervous...makes her feel like something is crawling on her.   No current facility-administered medications on file prior to encounter.   Current Outpatient Prescriptions on File Prior to Encounter  Medication Sig Dispense Refill  . atorvastatin (LIPITOR) 20 MG tablet Take 20 mg by mouth daily.      . budesonide-formoterol (SYMBICORT) 160-4.5 MCG/ACT inhaler Inhale 2 puffs into the lungs 2 (two) times daily.      . carvedilol (COREG) 12.5 MG tablet Take 12.5 mg by mouth 2 (two) times  daily with a meal.      . cetirizine (ZYRTEC) 10 MG tablet Take 1 tablet (10 mg total) by mouth daily.  30 tablet  11  . exenatide (BYETTA 5 MCG PEN) 5 MCG/0.02ML SOPN injection Inject 0.02 mLs (5 mcg total) into the skin 2 (two) times daily with a meal.  1.2 mL  3  . fluticasone (FLONASE) 50 MCG/ACT nasal spray Place 2 sprays into both nostrils daily.  16 g  6  . HYDROcodone-acetaminophen (NORCO) 10-325 MG per tablet Take 1 tablet by mouth 3 (three) times daily as needed for moderate pain. Do not fill before 08/12/2014  90 tablet  0  . ibuprofen (ADVIL,MOTRIN) 600 MG tablet Take 600 mg by mouth every 6 (six) hours as needed for mild pain or moderate pain.      Marland Kitchen insulin aspart (NOVOLOG) 100 UNIT/ML injection Inject 60 Units into the skin 3 (three) times daily with meals.  10 mL  5  . Insulin Glargine (LANTUS) 100 UNIT/ML Solostar Pen  Inject 150 Units into the skin 2 (two) times daily.      Marland Kitchen lisinopril (PRINIVIL,ZESTRIL) 10 MG tablet Take 1 tablet (10 mg total) by mouth daily.  30 tablet  5  . metFORMIN (GLUCOPHAGE) 500 MG tablet Take 500 mg by mouth 2 (two) times daily with a meal.      . pantoprazole (PROTONIX) 40 MG tablet Take 40 mg by mouth daily.      . traMADol (ULTRAM) 50 MG tablet Take 1 tablet (50 mg total) by mouth daily as needed for moderate pain.  15 tablet  1  . albuterol (PROVENTIL HFA;VENTOLIN HFA) 108 (90 BASE) MCG/ACT inhaler Inhale 1 puff into the lungs every 6 (six) hours as needed for wheezing or shortness of breath.  18 g  11  . nitroGLYCERIN (NITROSTAT) 0.4 MG SL tablet Place 1 tablet (0.4 mg total) under the tongue every 5 (five) minutes as needed for chest pain.  30 tablet  0    Objective: BP 138/79  Pulse 99  Temp(Src) 97.9 F (36.6 C) (Oral)  Resp 20  Ht '5\' 2"'  (1.575 m)  Wt 324 lb 1.6 oz (147.011 kg)  BMI 59.26 kg/m2  SpO2 98%  LMP 05/22/2014 Exam: General: well-appearing adult female, morbidly obese, sitting up in bed HEENT: [place]/AT, EOMI, PERRLA, MMM  Some pain with movements of right eye but no redness / swelling Cardiovascular: RRR, no murmur appreciated Respiratory: CTAB, no wheezes; shallow respiratory effort Abdomen: exam-limiting obesity, but soft, nontender, BS+ Extremities: warm, well-perfused, trace LE edema at ankles, distal pulses intact / symmetric Skin: warm, dry, intact Neuro: awake / alert, grossly intact, visual fields full with right subjective blurriness  Strength 5/5 throughout, normal finger-to-nose testing, normal rapid alternating movements  Normal sensation throughout  CN II-XII grossly intact, speech fluent / intact  Labs and Imaging: CBC BMET   Recent Labs Lab 07/12/14 1943  WBC 9.1  HGB 13.9  HCT 41.0  PLT 288    Recent Labs Lab 07/12/14 1943  NA 137  K 4.1  CL 99  CO2 26  BUN 8  CREATININE 0.39*  GLUCOSE 276*  CALCIUM 8.6     CTA head  and neck, with and without contrast, 8/7 '@2136'  No extracranial cause for amaurosis, no dissections, no intracranial flow-reducing lesions Moderate bilateral sinus disease, L > R  [place]rett, MD 07/13/2014, 12:50 AM PGY-3, [place] [facility] pager: [phone], text pages welcome

## 2014-07-12 NOTE — Consult Note (Signed)
Reason for Consult: Recurrent transient right eye visual changes.  HPI:                                                                                                                                          [name] is an 38 y.o. female Mr. diabetes mellitus, hyperlipidemia, COPD, morbid obesity and bipolar disorder, presenting with recurrent episodes of loss of vision involving right eye over the past 24-36 hours. The last episode occurred while in the emergency room but was clearing at the time of this exam. She's also complaining of mild right frontal headache. Headache has not been severe and she said no associated nausea nor a history of migraine headaches. She said no speech changes and no focal motor or sensory changes involving extremities. No imaging study of her head was obtained so far. She's been evaluated by outpatient ophthalmologist who recommended further evaluation including neurologic consultation. Patient's been on aspirin 81 mg per day. CT angiogram of head and neck showed no significant extracranial or intracranial right ICA disease.  Past Medical History  Diagnosis Date  . Morbid obesity   . Alveolar hypoventilation   . Asthma   . GERD (gastroesophageal reflux disease)   . Rectal fissure   . Hypertension   . Costochondritis   . Cellulitis 08/2010-08/2011  . Depression   . Bipolar 2 disorder   . Obstructive sleep apnea   . Chest pain     NO CAD by cath 0/07/12, nl LV systolic fxn  . Diabetes mellitus 2000    Type 2, Uncontrolled  . Arthritis   . HLD (hyperlipidemia)   . Anemia   . COPD (chronic obstructive pulmonary disease)     Past Surgical History  Procedure Laterality Date  . Carpal tunnel release    . Bil knee surgery    . Mass excision N/A 06/29/2013    Procedure:  WIDE LOCAL EXCISION OF POSTERIOR NECK ABSCESS;  Surgeon: Ralene Ok, MD;  Location: [facility];  Service: General;  Laterality: N/A;  . Cardiac catheterization      Family History   Problem Relation Age of Onset  . Diabetes Mother   . Hyperlipidemia Mother   . Depression Mother   . Heart attack Paternal Uncle   . Heart disease Paternal Grandmother   . Heart attack Paternal Grandmother   . Heart attack Paternal Grandfather   . Heart disease Paternal Grandfather     Social History:  reports that she quit smoking about 20 years ago. Her smoking use included Cigarettes. She has a .09 pack-year smoking history. She has never used smokeless tobacco. She reports that she does not drink alcohol or use illicit drugs.  Allergies  Allergen Reactions  . Kiwi Extract Anaphylaxis  . Nubain [Nalbuphine Hcl] Other (See Comments)    Nervous...makes her feel like something is crawling on her.    MEDICATIONS:  I have reviewed the patient's current medications.   ROS:                                                                                                                                       History obtained from the patient  General ROS: negative for - chills, fatigue, fever, night sweats, weight gain or weight loss Psychological ROS: negative for - behavioral disorder, hallucinations, memory difficulties, mood swings or suicidal ideation Ophthalmic ROS: As noted in history of present illness ENT ROS: negative for - epistaxis, nasal discharge, oral lesions, sore throat, tinnitus or vertigo Allergy and Immunology ROS: negative for - hives or itchy/watery eyes Hematological and Lymphatic ROS: negative for - bleeding problems, bruising or swollen lymph nodes Endocrine ROS: negative for - galactorrhea, hair pattern changes, polydipsia/polyuria or temperature intolerance Respiratory ROS: negative for - cough, hemoptysis, shortness of breath or wheezing Cardiovascular ROS: negative for - chest pain, dyspnea on exertion, edema or irregular  heartbeat Gastrointestinal ROS: negative for - abdominal pain, diarrhea, hematemesis, nausea/vomiting or stool incontinence Genito-Urinary ROS: negative for - dysuria, hematuria, incontinence or urinary frequency/urgency Musculoskeletal ROS: negative for - joint swelling or muscular weakness Neurological ROS: as noted in HPI Dermatological ROS: negative for rash and skin lesion changes   Blood pressure 124/73, pulse 103, temperature 98.4 F (36.9 C), temperature source Oral, resp. rate 18, height 5\' 2"  (1.575 m), weight 145.605 kg (321 lb), last menstrual period 05/22/2014, SpO2 95.00%.   Neurologic Examination:                                                                                                      Mental Status: Alert, oriented, thought content appropriate.  Speech fluent without evidence of aphasia. Able to follow commands without difficulty. Cranial Nerves: II-Visual fields were normal. III/IV/VI-Pupils were equal and reacted. Extraocular movements were full and conjugate.    V/VII-no facial numbness and no facial weakness. VIII-normal. X-normal speech and symmetrical palatal movement. Motor: 5/5 bilaterally with normal tone and bulk Sensory: Normal throughout. Deep Tendon Reflexes: 1+ and symmetric in upper extremities and absent at knees and ankles. Plantars: Flexor bilaterally Cerebellar: Normal finger-to-nose testing. Carotid auscultation: Normal  Lab Results  Component Value Date/Time   CHOL 282* 08/25/2012  9:12 AM    Results for orders placed during the hospital encounter of 07/12/14 (from the past 48 hour(s))  CBC     Status: None   Collection Time    07/12/14  7:43 PM  Result Value Ref Range   WBC 9.1  4.0 - 10.5 K/uL   RBC 4.59  3.87 - 5.11 MIL/uL   Hemoglobin 13.9  12.0 - 15.0 g/dL   HCT 41.0  36.0 - 46.0 %   MCV 89.3  78.0 - 100.0 fL   MCH 30.3  26.0 - 34.0 pg   MCHC 33.9  30.0 - 36.0 g/dL   RDW 12.6  11.5 - 15.5 %   Platelets 288  150 -  400 K/uL    No results found.   Assessment/Plan: 38 year old lady with history of diabetes mellitus and hypertension as well as hyperlipidemia, morbid obesity and COPD presenting with recurrent transient monocular visual loss on the right, of unclear etiology. There is associated headache which is mild. Patient has no history of migraine headaches. Migraine phenomena cannot be ruled out however. MRI is is he is back to likely with no demonstrable right internal carotid artery disease. Temporal arteritis is unlikely but cannot be completely ruled out at this point.  Recommendations: 1. Sedimentation rate 2. MRI of brain and orbits, without and with contrast  We will continue to follow this patient with you.  C.R. Nicole Kindred, MD [facility] [phone]  07/12/2014, 8:07 PM

## 2014-07-12 NOTE — ED Provider Notes (Signed)
CSN: [id number]     Arrival date & time 07/12/14  1819 History   First MD Initiated Contact with Patient 07/12/14 1848     Chief Complaint  Patient presents with  . Eye Pain  . Headache     (Consider location/radiation/quality/duration/timing/severity/associated sxs/prior Treatment) Patient is a 38 y.o. female presenting with eye pain and headaches. The history is provided by the patient and medical records. No language interpreter was used.  Eye Pain Associated symptoms include headaches. Pertinent negatives include no abdominal pain, chest pain, coughing, diaphoresis, fatigue, fever, nausea, rash or vomiting.  Headache Associated symptoms: eye pain   Associated symptoms: no abdominal pain, no back pain, no cough, no diarrhea, no fatigue, no fever, no nausea, no neck stiffness and no vomiting     Yaileen Hofferber is a 38 y.o. female  with a hx of obesity, asthma, GERD, HTN, CP, IDDM presents to the Emergency Department complaining of intermittent loss of vision in the right eye onset Tuesday (total of 3 lasting 62min or so)  Pt reports she has seen her opthalmologist who reported that she had a problem with the veins in her eye and recommended a cardiology and neurology follow-up.  He reported that her retina was OK and her pressures was normal, but she had a peripheral field deficit.  She called her cardiologist who sent her to neurology, but she instead saw her PCP who sent her here for a stroke work-up. Associated symptoms include headache.  Pt reports her headache is an 8/10 and it originates in her right eye.  Nothing makes it better or worse. Pt reports blurry vision in the right eye, but her eye was dilated earlier today.  Pt denies fever, chills, headache, neck pain, chest pain, SOB, abd pain, N/V/D, weakness, dizziness.     Past Medical History  Diagnosis Date  . Morbid obesity   . Alveolar hypoventilation   . Asthma   . GERD (gastroesophageal reflux disease)   . Rectal fissure   .  Hypertension   . Costochondritis   . Cellulitis 08/2010-08/2011  . Depression   . Bipolar 2 disorder   . Obstructive sleep apnea   . Chest pain     NO CAD by cath 8/91/69, nl LV systolic fxn  . Diabetes mellitus 2000    Type 2, Uncontrolled  . Arthritis   . HLD (hyperlipidemia)   . Anemia   . COPD (chronic obstructive pulmonary disease)    Past Surgical History  Procedure Laterality Date  . Carpal tunnel release    . Bil knee surgery    . Mass excision N/A 06/29/2013    Procedure:  WIDE LOCAL EXCISION OF POSTERIOR NECK ABSCESS;  Surgeon: Ralene Ok, MD;  Location: [place];  Service: General;  Laterality: N/A;  . Cardiac catheterization     Family History  Problem Relation Age of Onset  . Diabetes Mother   . Hyperlipidemia Mother   . Depression Mother   . Heart attack Paternal Uncle   . Heart disease Paternal Grandmother   . Heart attack Paternal Grandmother   . Heart attack Paternal Grandfather   . Heart disease Paternal Grandfather    History  Substance Use Topics  . Smoking status: Former Smoker -- 0.30 packs/day for .3 years    Types: Cigarettes    Quit date: 12/06/1993  . Smokeless tobacco: Never Used  . Alcohol Use: No   OB History   Grav Para Term Preterm Abortions TAB SAB Ect Mult Living  Review of Systems  Constitutional: Negative for fever, diaphoresis, appetite change, fatigue and unexpected weight change.  HENT: Negative for mouth sores.   Eyes: Positive for pain. Negative for visual disturbance.  Respiratory: Negative for cough, chest tightness, shortness of breath and wheezing.   Cardiovascular: Negative for chest pain.  Gastrointestinal: Negative for nausea, vomiting, abdominal pain, diarrhea and constipation.  Endocrine: Negative for polydipsia, polyphagia and polyuria.  Genitourinary: Negative for dysuria, urgency, frequency and hematuria.  Musculoskeletal: Negative for back pain and neck stiffness.  Skin: Negative for rash.   Allergic/Immunologic: Negative for immunocompromised state.  Neurological: Positive for headaches. Negative for syncope and light-headedness.  Hematological: Does not bruise/bleed easily.  Psychiatric/Behavioral: Negative for sleep disturbance. The patient is not nervous/anxious.       Allergies  Kiwi extract and Nubain  Home Medications   Prior to Admission medications   Medication Sig Start Date End Date Taking? Authorizing Provider  atorvastatin (LIPITOR) 20 MG tablet Take 20 mg by mouth daily.   Yes Historical Provider, MD  budesonide-formoterol (SYMBICORT) 160-4.5 MCG/ACT inhaler Inhale 2 puffs into the lungs 2 (two) times daily.   Yes Historical Provider, MD  carvedilol (COREG) 12.5 MG tablet Take 12.5 mg by mouth 2 (two) times daily with a meal.   Yes Historical Provider, MD  cetirizine (ZYRTEC) 10 MG tablet Take 1 tablet (10 mg total) by mouth daily. 06/11/14  Yes [name], MD  exenatide (BYETTA 5 MCG PEN) 5 MCG/0.02ML SOPN injection Inject 0.02 mLs (5 mcg total) into the skin 2 (two) times daily with a meal. 07/04/14  Yes Jayce G Cook, DO  fluticasone (FLONASE) 50 MCG/ACT nasal spray Place 2 sprays into both nostrils daily. 05/21/14  Yes Melony Overly, MD  HYDROcodone-acetaminophen (NORCO) 10-325 MG per tablet Take 1 tablet by mouth 3 (three) times daily as needed for moderate pain. Do not fill before 08/12/2014 07/03/14  Yes [name], MD  ibuprofen (ADVIL,MOTRIN) 600 MG tablet Take 600 mg by mouth every 6 (six) hours as needed for mild pain or moderate pain.   Yes Historical Provider, MD  insulin aspart (NOVOLOG) 100 UNIT/ML injection Inject 60 Units into the skin 3 (three) times daily with meals. 03/12/14  Yes [name], MD  Insulin Glargine (LANTUS) 100 UNIT/ML Solostar Pen Inject 150 Units into the skin 2 (two) times daily. 07/30/13  Yes [name], MD  lisinopril (PRINIVIL,ZESTRIL) 10 MG tablet Take 1 tablet (10 mg total) by mouth daily. 03/12/14   Yes [name], MD  metFORMIN (GLUCOPHAGE) 500 MG tablet Take 500 mg by mouth 2 (two) times daily with a meal.   Yes Historical Provider, MD  pantoprazole (PROTONIX) 40 MG tablet Take 40 mg by mouth daily.   Yes Historical Provider, MD  traMADol (ULTRAM) 50 MG tablet Take 1 tablet (50 mg total) by mouth daily as needed for moderate pain. 07/03/14  Yes [name], MD  albuterol (PROVENTIL HFA;VENTOLIN HFA) 108 (90 BASE) MCG/ACT inhaler Inhale 1 puff into the lungs every 6 (six) hours as needed for wheezing or shortness of breath. 06/11/14   [name], MD  nitroGLYCERIN (NITROSTAT) 0.4 MG SL tablet Place 1 tablet (0.4 mg total) under the tongue every 5 (five) minutes as needed for chest pain. 07/09/14   Jayce G Cook, DO   BP 148/85  Pulse 100  Temp(Src) 98.4 F (36.9 C) (Oral)  Resp 18  Ht 5\' 2"  (1.575 m)  Wt 321 lb (145.605 kg)  BMI 58.70 kg/m2  SpO2 96%  LMP 05/22/2014 Physical Exam  Nursing note and vitals reviewed. Constitutional: She is oriented to person, place, and time. She appears well-developed and well-nourished. No distress.  HENT:  Head: Normocephalic and atraumatic.  Mouth/Throat: Oropharynx is clear and moist.  Eyes: Conjunctivae and EOM are normal. Pupils are equal, round, and reactive to light. No scleral icterus.  No horizontal, vertical or rotational nystagmus  Neck: Normal range of motion. Neck supple.  Full active and passive ROM without pain No midline or paraspinal tenderness No nuchal rigidity or meningeal signs  Cardiovascular: Normal rate, regular rhythm, normal heart sounds and intact distal pulses.   No murmur heard. Pulmonary/Chest: Effort normal and breath sounds normal. No respiratory distress. She has no wheezes. She has no rales.  Abdominal: Soft. Bowel sounds are normal. There is no tenderness. There is no rebound and no guarding.  Musculoskeletal: Normal range of motion.  Lymphadenopathy:    She has no cervical adenopathy.   Neurological: She is alert and oriented to person, place, and time. She has normal reflexes. No cranial nerve deficit. She exhibits normal muscle tone. Coordination normal.  Mental Status:  Alert, oriented, thought content appropriate. Speech fluent without evidence of aphasia. Able to follow 2 step commands without difficulty.  Cranial Nerves:  II:  Peripheral visual fields grossly normal, pupils equal, round, reactive to light III,IV, VI: ptosis not present, extra-ocular motions intact bilaterally  V,VII: smile symmetric, facial light touch sensation equal VIII: hearing grossly normal bilaterally  IX,X: gag reflex present  XI: bilateral shoulder shrug equal and strong XII: midline tongue extension  Motor:  5/5 in upper and lower extremities bilaterally including strong and equal grip strength and dorsiflexion/plantar flexion Sensory: Pinprick and light touch normal in all extremities.  Deep Tendon Reflexes: 2+ and symmetric  Cerebellar: normal finger-to-nose with bilateral upper extremities Gait: normal gait and balance CV: distal pulses palpable throughout   Skin: Skin is warm and dry. No rash noted. She is not diaphoretic.  Psychiatric: She has a normal mood and affect. Her behavior is normal. Judgment and thought content normal.    ED Course  Procedures (including critical care time) Labs Review Labs Reviewed  BASIC METABOLIC PANEL - Abnormal; Notable for the following:    Glucose, Bld 276 (*)    Creatinine, Ser 0.39 (*)    All other components within normal limits  CBC  POC URINE PREG, ED    Imaging Review No results found.   EKG Interpretation None      MDM   Final diagnoses:  Vision, loss, sudden, right   Yosselin Zoeller presents with intermittent vision loss in the right eye for the last several days.  Pt seen and cleared by opthalmology today who recommended further evaluation for CVA.  Pt with normal neurologic exam at this time.    7:44 PM Pt evaluated by  Dr. Nicole Kindred of neurology and recommends CTA of the head and neck to r/o thrombus.  9:23 PM  Pt admitted to 4N12 by neurology.  BP 148/85  Pulse 100  Temp(Src) 98.4 F (36.9 C) (Oral)  Resp 18  Ht 5\' 2"  (1.575 m)  Wt 321 lb (145.605 kg)  BMI 58.70 kg/m2  SpO2 96%  LMP 05/22/2014   Abigail Butts, PA-C 07/12/14 2124

## 2014-07-12 NOTE — ED Notes (Signed)
Pt physically placed in room; pt getting undressed and into a gown at this time

## 2014-07-12 NOTE — Progress Notes (Signed)
Attempted report x2. Will continue to try to get report. [place], Nayali Talerico N, RN

## 2014-07-12 NOTE — ED Notes (Signed)
Pt still reports right eye pain, though vision is better.

## 2014-07-12 NOTE — Progress Notes (Signed)
Pt arrived Arts development officer by Cline Cools at 2154. Pt ambulated no assist to bed. Bed alarm on, call bell in place. Will continue to monitor.

## 2014-07-12 NOTE — Assessment & Plan Note (Addendum)
Sudden repetitive monocular vision loss, concerning for amaurosis fugax - No family history of early stroke or heart attack.  However, morbidly obese with uncontrolled DM ~ A1c 14, HTN - EMS transport to ED for evaluation by neurologist - Will likely need stroke work-up and carotid dopplers vs MRA - Pt already taking ASA and Statin (recommend increasing)

## 2014-07-12 NOTE — Progress Notes (Signed)
Attempted report x2, please call [phone] to give report. [place], Aaima Gaddie N, RN

## 2014-07-12 NOTE — ED Provider Notes (Signed)
Medical screening examination/treatment/procedure(s) were performed by non-physician practitioner and as supervising physician I was immediately available for consultation/collaboration.   EKG Interpretation None        Wandra Arthurs, MD 07/12/14 2325

## 2014-07-12 NOTE — Progress Notes (Signed)
Received report from [facility] ED RN @ 2130. Room ready and awaiting arrival of pt to 4N12.

## 2014-07-12 NOTE — ED Notes (Signed)
Pt is in the hallway at this time

## 2014-07-13 ENCOUNTER — Inpatient Hospital Stay (HOSPITAL_COMMUNITY): Payer: Medicaid Other

## 2014-07-13 LAB — GLUCOSE, CAPILLARY
Glucose-Capillary: 332 mg/dL — ABNORMAL HIGH (ref 70–99)
Glucose-Capillary: 289 mg/dL — ABNORMAL HIGH (ref 70–99)
Glucose-Capillary: 229 mg/dL — ABNORMAL HIGH (ref 70–99)
Glucose-Capillary: 313 mg/dL — ABNORMAL HIGH (ref 70–99)

## 2014-07-13 LAB — BASIC METABOLIC PANEL
ANION GAP: 13 (ref 5–15)
Anion gap: 12 (ref 5–15)
BUN: 8 mg/dL (ref 6–23)
BUN: 10 mg/dL (ref 6–23)
CHLORIDE: 98 meq/L (ref 96–112)
CO2: 25 meq/L (ref 19–32)
CO2: 25 meq/L (ref 19–32)
CREATININE: 0.4 mg/dL — AB (ref 0.50–1.10)
Calcium: 8.3 mg/dL — ABNORMAL LOW (ref 8.4–10.5)
Calcium: 8.4 mg/dL (ref 8.4–10.5)
Chloride: 100 mEq/L (ref 96–112)
Creatinine, Ser: 0.38 mg/dL — ABNORMAL LOW (ref 0.50–1.10)
GFR calc Af Amer: >90 mL/min (ref >90)
GFR calc Af Amer: >90 mL/min (ref >90)
GFR calc non Af Amer: >90 mL/min (ref >90)
GFR calc non Af Amer: >90 mL/min (ref >90)
GLUCOSE: 383 mg/dL — AB (ref 70–99)
GLUCOSE: 335 mg/dL — AB (ref 70–99)
Potassium: 3.7 mEq/L (ref 3.7–5.3)
Potassium: 4.1 mEq/L (ref 3.7–5.3)
Sodium: 136 mEq/L — ABNORMAL LOW (ref 137–147)
Sodium: 137 mEq/L (ref 137–147)

## 2014-07-13 LAB — SEDIMENTATION RATE: Sed Rate: 38 mm/hr — ABNORMAL HIGH (ref 0–22)

## 2014-07-13 MED ORDER — KETOROLAC TROMETHAMINE 15 MG/ML IJ SOLN
15.0000 mg | Freq: Once | INTRAMUSCULAR | Status: DC
  Filled 2014-07-13: qty 1

## 2014-07-13 MED ORDER — GADOBENATE DIMEGLUMINE 529 MG/ML IV SOLN
20.0000 mL | Freq: Once | INTRAVENOUS | Status: AC | PRN
  Administered 2014-07-13: 20 mL via INTRAVENOUS

## 2014-07-13 MED ORDER — HYDROMORPHONE HCL PF 1 MG/ML IJ SOLN
1.0000 mg | Freq: Once | INTRAMUSCULAR | Status: AC
  Administered 2014-07-13: 1 mg via INTRAVENOUS
  Filled 2014-07-13: qty 1

## 2014-07-13 MED ORDER — PNEUMOCOCCAL VAC POLYVALENT 25 MCG/0.5ML IJ INJ
0.5000 mL | INJECTION | INTRAMUSCULAR | Status: DC
  Filled 2014-07-13: qty 0.5

## 2014-07-13 MED ORDER — DIPHENHYDRAMINE HCL 50 MG/ML IJ SOLN
25.0000 mg | Freq: Once | INTRAMUSCULAR | Status: DC
  Filled 2014-07-13: qty 1

## 2014-07-13 MED ORDER — PROCHLORPERAZINE EDISYLATE 5 MG/ML IJ SOLN
10.0000 mg | Freq: Once | INTRAMUSCULAR | Status: DC
  Filled 2014-07-13: qty 2

## 2014-07-13 NOTE — H&P (Signed)
FMTS ATTENDING ADMISSION NOTE Belinda Wonnacott,MD I  have seen and examined this patient, reviewed their chart. I have discussed this patient with the resident. I agree with the resident's findings, assessment and care plan.  38 Y/O F with PMX of obesity,HLD, Dm2,OSA and HTN, presented with recurrent sudden right vision loss that started about a week ago for which she had been seen by her ophthalmologist twice and she found nothing. She was also seen by her cardiology due to suspected cardiac or vascular cause of her vision loss. Patient mentioned she has had total of 4 episodes of complete vision loss of her right eye and now it is blurry, she wears eye glasses normally and still can not see with her lenses. She denies any headache, no N/V. No other concern.  No current facility-administered medications on file prior to encounter.   Current Outpatient Prescriptions on File Prior to Encounter  Medication Sig Dispense Refill  . atorvastatin (LIPITOR) 20 MG tablet Take 20 mg by mouth daily.      . budesonide-formoterol (SYMBICORT) 160-4.5 MCG/ACT inhaler Inhale 2 puffs into the lungs 2 (two) times daily.      . carvedilol (COREG) 12.5 MG tablet Take 12.5 mg by mouth 2 (two) times daily with a meal.      . cetirizine (ZYRTEC) 10 MG tablet Take 1 tablet (10 mg total) by mouth daily.  30 tablet  11  . exenatide (BYETTA 5 MCG PEN) 5 MCG/0.02ML SOPN injection Inject 0.02 mLs (5 mcg total) into the skin 2 (two) times daily with a meal.  1.2 mL  3  . fluticasone (FLONASE) 50 MCG/ACT nasal spray Place 2 sprays into both nostrils daily.  16 g  6  . HYDROcodone-acetaminophen (NORCO) 10-325 MG per tablet Take 1 tablet by mouth 3 (three) times daily as needed for moderate pain. Do not fill before 08/12/2014  90 tablet  0  . ibuprofen (ADVIL,MOTRIN) 600 MG tablet Take 600 mg by mouth every 6 (six) hours as needed for mild pain or moderate pain.      Marland Kitchen insulin aspart (NOVOLOG) 100 UNIT/ML injection Inject 60 Units  into the skin 3 (three) times daily with meals.  10 mL  5  . Insulin Glargine (LANTUS) 100 UNIT/ML Solostar Pen Inject 150 Units into the skin 2 (two) times daily.      Marland Kitchen lisinopril (PRINIVIL,ZESTRIL) 10 MG tablet Take 1 tablet (10 mg total) by mouth daily.  30 tablet  5  . metFORMIN (GLUCOPHAGE) 500 MG tablet Take 500 mg by mouth 2 (two) times daily with a meal.      . pantoprazole (PROTONIX) 40 MG tablet Take 40 mg by mouth daily.      . traMADol (ULTRAM) 50 MG tablet Take 1 tablet (50 mg total) by mouth daily as needed for moderate pain.  15 tablet  1  . albuterol (PROVENTIL HFA;VENTOLIN HFA) 108 (90 BASE) MCG/ACT inhaler Inhale 1 puff into the lungs every 6 (six) hours as needed for wheezing or shortness of breath.  18 g  11  . nitroGLYCERIN (NITROSTAT) 0.4 MG SL tablet Place 1 tablet (0.4 mg total) under the tongue every 5 (five) minutes as needed for chest pain.  30 tablet  0   Past Medical History  Diagnosis Date  . Morbid obesity   . Alveolar hypoventilation   . Asthma   . GERD (gastroesophageal reflux disease)   . Rectal fissure   . Hypertension   . Costochondritis   . Cellulitis  08/2010-08/2011  . Depression   . Bipolar 2 disorder   . Obstructive sleep apnea   . Chest pain     NO CAD by cath 4/76/54, nl LV systolic fxn  . Diabetes mellitus 2000    Type 2, Uncontrolled  . Arthritis   . HLD (hyperlipidemia)   . Anemia   . COPD (chronic obstructive pulmonary disease)    Filed Vitals:   07/12/14 2221 07/12/14 2350 07/13/14 0106 07/13/14 0514  BP: 138/79  119/61 113/48  Pulse: 99 99 100 105  Temp: 97.9 F (36.6 C)  98 F (36.7 C) 98.2 F (36.8 C)  TempSrc: Oral  Oral Oral  Resp: 20 20 20 20   Height:      Weight:      SpO2: 98% 98% 97% 96%   Exam: Gen: Mobidly obese Neuro: Awake and alert, oriented x 3. HEENT: EOMI, PERRLA, Blurry left eye vision to finger counting test with her wearing her corrective eye lenses. Resp: Air entry equal and clear B/L. CV: S1 S2  normal, no murmur. Abd: Soft, NT, obese,bowel sound normal. Ext: No edema.  A/P: 38 Y/O with 1. Right recurrent vision loss with blurriness. Etiology unclear,?? MS vs Giant cell arteritis vs stroke ( unlikely).     S/P ophthalmology assessment.     CTA brain and neck negative so far.     Neurology consulted.     MRI brain recommended.     Start ASA and Statin for vascular protection.     F/U MRI report.  2. Dm/HTN/HLD: Review home regimen and restart as appropriate.     Also add SSI to DM regimen.    Monitor V/S and CBG.    Daily BMET.

## 2014-07-13 NOTE — Progress Notes (Signed)
Pt requesting Dilaudid for "severe headche", 1 mg IV dose given, pt vomited shortly after.

## 2014-07-13 NOTE — Progress Notes (Signed)
Subjective: Continues to have blurred vision in right eye.   Exam: Filed Vitals:   07/13/14 0514  BP: 113/48  Pulse: 105  Temp: 98.2 F (36.8 C)  Resp: 20   Gen: In bed, NAD MS: Awake, alert, interactive and appropriate.  RJ:JOACZ, no afferent pupillary defect.  Motor: MAEW Sensory:intact to LT   Impression: 38 yo F with intermittent vision loss, persistent blurred vision, and eye pain. CRAO is not typically painful and there is no afferent pupillary defect to suggest optic neuritis. She is too young for giant cell arteritis. Retinal migraine is still a possibility.   Recommendations: 1) MRI brain/orbits.  2) If negative, would treat with compazine/benadryl/toradol for possible migraine.  3) will continue to follow.   Roland Rack, MD [facility] [phone]  If 7pm- 7am, please page neurology on call as listed in [place].

## 2014-07-13 NOTE — Progress Notes (Signed)
Family Medicine Teaching Service Daily Progress Note Intern Pager: [phone]  Patient name: [name] Medical record number: [id number] Date of birth: [birth year] Age: 38 y.o. Gender: female  Primary Care Provider: [name], MD Consultants: Neurology Code Status: FULL   Pt Overview and Major Events to Date:  8/7: Admitted for intermittent right eye vision loss/blurriness and eye pain x 2-3days.  Assessment and Plan: [name] is a 38 y.o. female presenting with intermittent right eye vision loss for 2-3 days and right eye pain. PMH is significant for poorly-controled DM on insulin and Byetta, HTN, HLD, morbid obesity, OSA/OHS, GERD, asthma, and chronic pain.    Right eye vision loss / pain - uncertain cause with broad differential as above. CTA shows no definite cause for vision loss. DDx broad, including stroke (unlikely given no other neuro symptoms), TIA, MS (though pt has no family history or other neuro symptoms), temporal arteritis (however odd in this age range), infectious causes (no elevated WBC or febrile illness, no redness / swelling to suggest cellulitis). ESR elevated to 38. - Neurology c/s, appreciate recs: didn't feel this was giant cell arteritis due to age, noted no afferent pupillary defect to suggest optic neuritis. Felt this could be 2/2 retinal migraine and recommended compazine/benadryl/toradol if MRI negative. -MRI of brain/orbits ordered to evaluate for inflammatory and / or organic brain / nerve issues  - may consider discussion with outpt ophthalmologist, but at this point no definite eye-specific pathology identified (no glaucoma, retinal detachment, etc) - Tylenol PO and morphine IV PRN for pain; plan to transition to PO medications as tolerated   Diabetes - last A1c recently >14, on Byetta 5 mcg BID, Lantus 150 BID and Novolog 60 TID with meals at home, as well as metformin   - CBGs 276-400 - holding metformin after dye load for CTA  - ordered for  CBG's qAC+HS, Lantus and Byetta at home dosing, and Novolog resistant sliding scale  - holding off on meal-time insulin for now, though expect pt's insulin requirement may need to be adjusted upwards  - BMP without evidence for DKA   HTN - stable overnight, on lisinopril and Coreg at home  - continue home meds; consider hydralazine PRN if needed   HLD  - continue home Lipitor 20 mg daily   Asthma / OSH / OSA  - continue home albuterol PRN, scheduled Symbicort, night-time CPAP   Seasonal allergies  - Claritin for home Zyrtec   GERD  - continue home Protonix   FEN/GI: carb-modified / heart healthy diet, KVO IVF  Prophylaxis: home Protonix, Lovenox  Disposition: Possible discharge today pending MRI. Pt will need continued f/u with ophthalmology  Subjective:  Patient endorses that right eye pain had resolved, however has now returned. Vision continues to be blurred. Denies headache or other focal neurologic deficit. Never had this before or any other neurologic changes.   Objective: Temp:  [97.9 F (36.6 C)-98.6 F (37 C)] 98.1 F (36.7 C) (08/08 1020) Pulse Rate:  [94-105] 97 (08/08 1020) Resp:  [18-22] 18 (08/08 1020) BP: (113-148)/(48-91) 115/70 mmHg (08/08 1020) SpO2:  [93 %-100 %] 93 % (08/08 1020) Weight:  [321 lb (145.605 kg)-324 lb 1.6 oz (147.011 kg)] 324 lb 1.6 oz (147.011 kg) (08/07 2202) Physical Exam: General: Well-appearing adult female, morbidly obese, sitting up in bed  HEENT: [place]/AT, EOMI (no pain with movement), PERRLA, Oropharynx clear.  MMM. Cojunctivae non-icteric without erythema. No redness noted around the eye. Cardiovascular: RRR, no murmur appreciated  Respiratory:  CTAB. Shallow respiratory effort  Abdomen: Obese, soft, nontender  Extremities: warm, well-perfused, trace LE edema at ankles, distal pulses intact / symmetric  Skin: warm, dry, intact  Neuro: awake / alert, grossly intact, visual fields full with right subjective blurriness  Strength 5/5  throughout, normal finger-to-nose testing, normal rapid alternating movements  Normal sensation throughout    Laboratory:  Recent Labs Lab 07/12/14 1943  WBC 9.1  HGB 13.9  HCT 41.0  PLT 288    Recent Labs Lab 07/12/14 0152 07/12/14 1943 07/13/14 0434  NA 136* 137 137  K 3.7 4.1 4.1  CL 98 99 100  CO2 _0 BUN _1 CREATININE 0.40* 0.39* 0.38*  CALCIUM 8.3* 8.6 8.4  GLUCOSE 383* 276* 335*  ESR 38    Imaging/Diagnostic Tests: CTA head/neck: No extracranial cause for amaurosis is observed. No dissection or atheromatous change at the carotid bifurcations. No evidence for dissection. No intracranial flow reducing lesion is observed. There is some calcific atheromatous change near the LEFT ICA ophthalmic origin but none is present on the RIGHT. Symmetric and unremarkable BILATERAL ophthalmic arteries. Moderately advanced sinus disease, LEFT greater than RIGHT, most notably LEFT maxillary sinus.   Archie Patten, MD 07/13/2014, 10:29 AM PGY-1, [place] Intern pager: [phone], text pages welcome

## 2014-07-13 NOTE — Progress Notes (Signed)
FMTS ATTENDING  NOTE Kehinde Eniola,MD I  have seen and examined this patient, reviewed their chart. I have discussed this patient with the resident. I agree with the resident's findings, assessment and care plan. 

## 2014-07-13 NOTE — Progress Notes (Signed)
Patient states that she does not want to wear cpap tonight and will call if she changes her mind.

## 2014-07-14 DIAGNOSIS — M25569 Pain in unspecified knee: Secondary | ICD-10-CM

## 2014-07-14 DIAGNOSIS — G4700 Insomnia, unspecified: Secondary | ICD-10-CM

## 2014-07-14 DIAGNOSIS — F3289 Other specified depressive episodes: Secondary | ICD-10-CM

## 2014-07-14 DIAGNOSIS — R05 Cough: Secondary | ICD-10-CM

## 2014-07-14 DIAGNOSIS — F329 Major depressive disorder, single episode, unspecified: Secondary | ICD-10-CM

## 2014-07-14 DIAGNOSIS — R059 Cough, unspecified: Secondary | ICD-10-CM

## 2014-07-14 LAB — GLUCOSE, CAPILLARY
Glucose-Capillary: 235 mg/dL — ABNORMAL HIGH (ref 70–99)
Glucose-Capillary: 252 mg/dL — ABNORMAL HIGH (ref 70–99)

## 2014-07-14 NOTE — Progress Notes (Signed)
Subjective: Completely resolved.   Exam: Filed Vitals:   07/14/14 0612  BP: 138/85  Pulse: 99  Temp: 98.2 F (36.8 C)  Resp: 22   Gen: In bed, NAD MS: Awake, alert, interactive and appropriate.  BM:WUXLK, no afferent pupillary defect.  Motor: MAEW Sensory:intact to LT   Impression: 38 yo F with intermittent vision loss, persistent blurred vision, and eye pain. CRAO is not typically painful and there is no afferent pupillary defect to suggest optic neuritis. She is too young for giant cell arteritis. With resolution of symptoms, I think that retinal migraine is the most likely explanation. Optic neuritis would not be expected to improve like this.   Recommendations: 1) No further recommendations at this time. Please call with any further questions or concerns.   Roland Rack, MD [facility] [phone]  If 7pm- 7am, please page neurology on call as listed in [place].

## 2014-07-14 NOTE — Progress Notes (Signed)
FMTS ATTENDING  NOTE Shyam Dawson,MD I  have seen and examined this patient, reviewed their chart. I have discussed this patient with the resident. I agree with the resident's findings, assessment and care plan. 

## 2014-07-14 NOTE — Progress Notes (Signed)
Family Medicine Teaching Service Daily Progress Note Intern Pager: [phone]  Patient name: [name] Medical record number: [id number] Date of birth: [birth year] Age: 38 y.o. Gender: female  Primary Care Provider: [name], MD Consultants: Neurology Code Status: FULL   Pt Overview and Major Events to Date:  8/7: Admitted for intermittent right eye vision loss/blurriness and eye pain x 2-3days. 8/7: MRI: normal brain only paranasal sinus inflammation     Assessment and Plan: [name] is a 38 y.o. female presenting with intermittent right eye vision loss for 2-3 days and right eye pain. PMH is significant for poorly-controled DM on insulin and Byetta, HTN, HLD, morbid obesity, OSA/OHS, GERD, asthma, and chronic pain.    #Right eye vision loss / pain - All symptoms resolved.  - Neurology c/s, appreciate recs: ok to D/C  - MRI of brain/orbits ordered to evaluate for inflammatory and / or organic brain / nerve issues  - Tylenol PO and morphine IV PRN for pain  #DM2 - last A1c recently >14, - CBGs 200-300's  - holding metformin after dye load for CTA  - ordered for CBG's qAC+HS,  - Lantus and Byetta at home dosing - Novolog resistant sliding scale  - holding off on meal-time insulin for now, though expect pt's insulin requirement may need to be adjusted upwards    #HTN - controlled  - continue home meds; consider hydralazine PRN if needed   #HLD  - continue home Lipitor 20 mg daily   #Asthma / OSH / OSA: controlled  - continue home albuterol PRN, scheduled Symbicort, night-time CPAP   #Seasonal allergies  - Claritin for home Zyrtec   #GERD  - continue home Protonix   FEN/GI: carb-modified / heart healthy diet, KVO IVF  Prophylaxis: home Protonix, Lovenox  Disposition: pending improvement. Pt will need continued f/u with ophthalmology  Subjective:  Feeling better today with no symptoms.   Objective: Temp:  [97.6 F (36.4 C)-98.4 F (36.9 C)] 98.2 F (36.8  C) (08/09 0612) Pulse Rate:  [97-104] 99 (08/09 0612) Resp:  [18-22] 22 (08/09 0612) BP: (115-138)/(65-85) 138/85 mmHg (08/09 0612) SpO2:  [93 %-99 %] 95 % (08/09 0612) Physical Exam: General: Well-appearing adult female, morbidly obese, sitting up  HEENT: /AT, EOMI (no pain with movement). Cojunctivae non-icteric without erythema. No redness noted around the eye. Cardiovascular: RRR, no murmur appreciated  Respiratory: CTAB. Shallow respiratory effort  Abdomen: Obese, soft, nontender  Neuro: awake / alert, grossly intact,   Laboratory:  Recent Labs Lab 07/12/14 1943  WBC 9.1  HGB 13.9  HCT 41.0  PLT 288    Recent Labs Lab 07/12/14 0152 07/12/14 1943 07/13/14 0434  NA 136* 137 137  K 3.7 4.1 4.1  CL 98 99 100  CO2 25 26 25   BUN 8 8 10   CREATININE 0.40* 0.39* 0.38*  CALCIUM 8.3* 8.6 8.4  GLUCOSE 383* 276* 335*     Recent Labs Lab 07/13/14 0635 07/13/14 1156 07/13/14 1618 07/13/14 2120 07/14/14 0704  GLUCAP 332* 289* 229* 313* 235*     Imaging/Diagnostic Tests: CTA head/neck: No extracranial cause for amaurosis is observed. No dissection or atheromatous change at the carotid bifurcations. No evidence for dissection. No intracranial flow reducing lesion is observed. There is some calcific atheromatous change near the LEFT ICA ophthalmic origin but none is present on the RIGHT. Symmetric and unremarkable BILATERAL ophthalmic arteries. Moderately advanced sinus disease, LEFT greater than RIGHT, most notably LEFT maxillary sinus.  MRI brain/ orbits w w/out  IMPRESSION:  1. Normal MRI appearance of the brain.  2. Normal MRI appearance of the orbits.  3. Moderate to severe paranasal sinus inflammation, greater on the  left.     Rosemarie Ax, MD 07/14/2014, 9:10 AM PGY-2, [place] Intern pager: [phone], text pages welcome

## 2014-07-14 NOTE — Discharge Summary (Signed)
FMTS ATTENDING  NOTE Khyran Riera,MD I  have seen and examined this patient, reviewed their chart. I have discussed this patient with the resident. I agree with the resident's findings, assessment and care plan. 

## 2014-07-14 NOTE — Discharge Summary (Signed)
[facility] Discharge Summary  Patient name: [name] Medical record number: [id number] Date of birth: 19-Jun-1976 Age: 38 y.o. Gender: female Date of Admission: 07/12/2014  Date of Discharge: 07/14/14 Admitting Physician: Andrena Mews, MD  Primary Care Provider: [name], MD Consultants: Neuro   Indication for Hospitalization: vision loss   Discharge Diagnoses/Problem List:  Patient Active Problem List   Diagnosis Date Noted  . Vision, loss, sudden 07/12/2014  . Middle ear effusion 05/21/2014  . Otitis externa 05/21/2014  . Cough 05/13/2014  . Right foot sprain 04/16/2014  . Elbow pain, right 01/07/2014  . Urge incontinence 10/15/2013  . Insomnia 08/14/2013  . Encounter for chronic pain management 06/30/2013  . Low back pain 01/31/2013  . HLD (hyperlipidemia) 11/19/2012  . Chest pain 06/27/2012  . Epigastric pain 06/22/2012  . Right carpal tunnel syndrome 09/01/2011  . Bilateral knee pain 09/01/2011  . DM (diabetes mellitus), type 2, uncontrolled 05/22/2008  . Morbid obesity 05/22/2008  . 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 DEPRESSION 05/22/2008  . OBSTRUCTIVE SLEEP APNEA 05/22/2008  . HYPERTENSION 05/22/2008  . ASTHMA 05/22/2008  . GERD 05/22/2008    Disposition: home   Discharge Condition: good   Discharge Exam:  General: Well-appearing adult female, morbidly obese, sitting up  HEENT: [place]/AT, EOMI (no pain with movement). Cojunctivae non-icteric without erythema. No redness noted around the eye.  Cardiovascular: RRR, no murmur appreciated  Respiratory: CTAB. Shallow respiratory effort  Abdomen: Obese, soft, nontender  Neuro: awake / alert, grossly intact,   Brief Hospital Course:  [name] is a 38 y.o. female presenting with intermittent right eye vision loss for 2-3 days and right eye pain. PMH is significant for poorly-controled DM on insulin and Byetta, HTN, HLD, morbid obesity, OSA/OHS, GERD, asthma, and  chronic pain.   #Right eye vision loss / pain - She presented to clinic with sudden eye loss and pain. She was assessed by opthalmology earlier that day. Imaging was not revealing of any origin. Neurology thought it was caused by migraine due to resolution of her symptoms on day of discharge. Neurology had no further recommendations. She was asymptomatic on day of discharge.    #DM2 - last A1c recently >14. Blood sugars were ranging in the 200's-300's. She was continued on her home medications as well as SSI.   #HTN: Controlled and continued on home meds.   #HLD: Stable and continued home Lipitor 20 mg daily   #Asthma / OSH / OSA: Controlled and continued on home albuterol PRN, scheduled Symbicort, night-time CPAP.    #Seasonal allergies: Under control and continued on home Claritin for home Zyrtec   #GERD: Stable and continued on home medications.   Issues for Follow Up:  1. Right eye vision loss/pain: Need opthalmology appt within a week.  2. Consider Titration of insulin  3. Neuro thought symptoms may have been caused by Migraines. No history of migraines. Consider adding an abortifacient   Significant Procedures: none  Significant Labs and Imaging:   Recent Labs Lab 07/12/14 1943  WBC 9.1  HGB 13.9  HCT 41.0  PLT 288    Recent Labs Lab 07/12/14 0152 07/12/14 1943 07/13/14 0434  NA 136* 137 137  K 3.7 4.1 4.1  CL 98 99 100  CO2 25 26 25   GLUCOSE 383* 276* 335*  BUN 8 8 10   CREATININE 0.40* 0.39* 0.38*  CALCIUM 8.3* 8.6 8.4    Recent Labs Lab 07/13/14 1156 07/13/14 1618 07/13/14 2120 07/14/14 0704 07/14/14  [place]     CTA head/neck: No extracranial cause for amaurosis is observed. No dissection or atheromatous change at the carotid bifurcations. No evidence for dissection. No intracranial flow reducing lesion is observed. There is some calcific atheromatous change near the LEFT ICA ophthalmic origin but none is present on the  RIGHT. Symmetric and unremarkable BILATERAL ophthalmic arteries. Moderately advanced sinus disease, LEFT greater than RIGHT, most notably LEFT maxillary sinus.   MRI brain/ orbits w w/out  IMPRESSION:  1. Normal MRI appearance of the brain.  2. Normal MRI appearance of the orbits.  3. Moderate to severe paranasal sinus inflammation, greater on the  left.  Results/Tests Pending at Time of Discharge: none  Discharge Medications:    Medication List         albuterol 108 (90 BASE) MCG/ACT inhaler  Commonly known as:  PROVENTIL HFA;VENTOLIN HFA  Inhale 1 puff into the lungs every 6 (six) hours as needed for wheezing or shortness of breath.     atorvastatin 20 MG tablet  Commonly known as:  LIPITOR  Take 20 mg by mouth daily.     budesonide-formoterol 160-4.5 MCG/ACT inhaler  Commonly known as:  SYMBICORT  Inhale 2 puffs into the lungs 2 (two) times daily.     carvedilol 12.5 MG tablet  Commonly known as:  COREG  Take 12.5 mg by mouth 2 (two) times daily with a meal.     cetirizine 10 MG tablet  Commonly known as:  ZYRTEC  Take 1 tablet (10 mg total) by mouth daily.     exenatide 5 MCG/0.02ML Sopn injection  Commonly known as:  BYETTA 5 MCG PEN  Inject 0.02 mLs (5 mcg total) into the skin 2 (two) times daily with a meal.     fluticasone 50 MCG/ACT nasal spray  Commonly known as:  FLONASE  Place 2 sprays into both nostrils daily.     HYDROcodone-acetaminophen 10-325 MG per tablet  Commonly known as:  NORCO  Take 1 tablet by mouth 3 (three) times daily as needed for moderate pain. Do not fill before 08/12/2014     ibuprofen 600 MG tablet  Commonly known as:  ADVIL,MOTRIN  Take 600 mg by mouth every 6 (six) hours as needed for mild pain or moderate pain.     insulin aspart 100 UNIT/ML injection  Commonly known as:  novoLOG  Inject 60 Units into the skin 3 (three) times daily with meals.     Insulin Glargine 100 UNIT/ML Solostar Pen  Commonly known as:  LANTUS  Inject  150 Units into the skin 2 (two) times daily.     lisinopril 10 MG tablet  Commonly known as:  PRINIVIL,ZESTRIL  Take 1 tablet (10 mg total) by mouth daily.     metFORMIN 500 MG tablet  Commonly known as:  GLUCOPHAGE  Take 500 mg by mouth 2 (two) times daily with a meal.     nitroGLYCERIN 0.4 MG SL tablet  Commonly known as:  NITROSTAT  Place 1 tablet (0.4 mg total) under the tongue every 5 (five) minutes as needed for chest pain.     pantoprazole 40 MG tablet  Commonly known as:  PROTONIX  Take 40 mg by mouth daily.     traMADol 50 MG tablet  Commonly known as:  ULTRAM  Take 1 tablet (50 mg total) by mouth daily as needed for moderate pain.        Discharge Instructions: Please refer to  Patient Instructions section of EMR for full details.  Patient was counseled important signs and symptoms that should prompt return to medical care, changes in medications, dietary instructions, activity restrictions, and follow up appointments.   Follow-Up Appointments:     Follow-up Information   Follow up with [name], MD In 1 week. (hospital follow up)    Specialty:  Family Medicine   Contact information:   [place] [address] [phone]       Rosemarie Ax, MD 07/14/2014, 2:01 PM PGY-2, [place]

## 2014-07-14 NOTE — Progress Notes (Signed)
Pt requested for CPAP to be placed on. RRT agreed for this RN to place pt on CPAP. Pt tolerated well. Will continue to monitor.

## 2014-07-14 NOTE — Discharge Instructions (Signed)

## 2014-07-15 ENCOUNTER — Encounter: Payer: Self-pay | Admitting: Family Medicine

## 2014-07-15 ENCOUNTER — Ambulatory Visit (INDEPENDENT_AMBULATORY_CARE_PROVIDER_SITE_OTHER): Payer: Medicaid Other | Admitting: Family Medicine

## 2014-07-15 VITALS — BP 124/88 | HR 105 | Temp 97.7°F | Ht 62.0 in | Wt 331.0 lb

## 2014-07-15 DIAGNOSIS — R51 Headache: Secondary | ICD-10-CM

## 2014-07-15 DIAGNOSIS — R519 Headache, unspecified: Secondary | ICD-10-CM | POA: Insufficient documentation

## 2014-07-15 MED ORDER — SUMATRIPTAN SUCCINATE 50 MG PO TABS: 50.0000 mg | ORAL_TABLET | Freq: Once | ORAL | Status: DC

## 2014-07-15 NOTE — Patient Instructions (Signed)
Great to meet you!  Migraine Headache A migraine headache is an intense, throbbing pain on one or both sides of your head. A migraine can last for 30 minutes to several hours. CAUSES  The exact cause of a migraine headache is not always known. However, a migraine may be caused when nerves in the brain become irritated and release chemicals that cause inflammation. This causes pain. Certain things may also trigger migraines, such as:  Alcohol.  Smoking.  Stress.  Menstruation.  Aged cheeses.  Foods or drinks that contain nitrates, glutamate, aspartame, or tyramine.  Lack of sleep.  Chocolate.  Caffeine.  Hunger.  Physical exertion.  Fatigue.  Medicines used to treat chest pain (nitroglycerine), birth control pills, estrogen, and some blood pressure medicines. SIGNS AND SYMPTOMS  Pain on one or both sides of your head.  Pulsating or throbbing pain.  Severe pain that prevents daily activities.  Pain that is aggravated by any physical activity.  Nausea, vomiting, or both.  Dizziness.  Pain with exposure to bright lights, loud noises, or activity.  General sensitivity to bright lights, loud noises, or smells. Before you get a migraine, you may get warning signs that a migraine is coming (aura). An aura may include:  Seeing flashing lights.  Seeing bright spots, halos, or zigzag lines.  Having tunnel vision or blurred vision.  Having feelings of numbness or tingling.  Having trouble talking.  Having muscle weakness. DIAGNOSIS  A migraine headache is often diagnosed based on:  Symptoms.  Physical exam.  A CT scan or MRI of your head. These imaging tests cannot diagnose migraines, but they can help rule out other causes of headaches. TREATMENT Medicines may be given for pain and nausea. Medicines can also be given to help prevent recurrent migraines.  HOME CARE INSTRUCTIONS  Only take over-the-counter or prescription medicines for pain or discomfort  as directed by your health care provider. The use of long-term narcotics is not recommended.  Lie down in a dark, quiet room when you have a migraine.  Keep a journal to find out what may trigger your migraine headaches. For example, write down:  What you eat and drink.  How much sleep you get.  Any change to your diet or medicines.  Limit alcohol consumption.  Quit smoking if you smoke.  Get 7-9 hours of sleep, or as recommended by your health care provider.  Limit stress.  Keep lights dim if bright lights bother you and make your migraines worse. SEEK IMMEDIATE MEDICAL CARE IF:   Your migraine becomes severe.  You have a fever.  You have a stiff neck.  You have vision loss.  You have muscular weakness or loss of muscle control.  You start losing your balance or have trouble walking.  You feel faint or pass out.  You have severe symptoms that are different from your first symptoms. MAKE SURE YOU:   Understand these instructions.  Will watch your condition.  Will get help right away if you are not doing well or get worse. Document Released: 11/22/2005 Document Revised: 04/08/2014 Document Reviewed: 07/30/2013 [facility] Patient Information 2015 [place], [place]. This information is not intended to replace advice given to you by your health care provider. Make sure you discuss any questions you have with your health care provider.

## 2014-07-15 NOTE — Progress Notes (Signed)
Patient ID: [name], female   DOB: [birth year], 38 y.o.   MRN: [id number]  Kenn File, MD Phone: [phone]  Subjective:  Chief complaint-noted  Pt Here for followup headache  In her recent admission with headache and unilateral loss of vision. Extensive workup was negative and neurology felt that she had these changes do to migraine headache.  She states that this morning she had a left-sided throbbing headache that gradually onset and has persisted throughout the day. She's taking tramadol x1 which has not helped. She states that it's worse with light. She denies any GI involvement including nausea.  She does not have a history of migraines before last Friday.  ROS-  Per history of present illness  Past Medical History Patient Active Problem List   Diagnosis Date Noted  . Headache(784.0) 07/15/2014  . Vision, loss, sudden 07/12/2014  . Middle ear effusion 05/21/2014  . Otitis externa 05/21/2014  . Cough 05/13/2014  . Right foot sprain 04/16/2014  . Elbow pain, right 01/07/2014  . Urge incontinence 10/15/2013  . Insomnia 08/14/2013  . Encounter for chronic pain management 06/30/2013  . Low back pain 01/31/2013  . HLD (hyperlipidemia) 11/19/2012  . Chest pain 06/27/2012  . Epigastric pain 06/22/2012  . Right carpal tunnel syndrome 09/01/2011  . Bilateral knee pain 09/01/2011  . DM (diabetes mellitus), type 2, uncontrolled 05/22/2008  . Morbid obesity 05/22/2008  . 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 DEPRESSION 05/22/2008  . OBSTRUCTIVE SLEEP APNEA 05/22/2008  . HYPERTENSION 05/22/2008  . ASTHMA 05/22/2008  . GERD 05/22/2008    Medications- reviewed and updated Current Outpatient Prescriptions  Medication Sig Dispense Refill  . albuterol (PROVENTIL HFA;VENTOLIN HFA) 108 (90 BASE) MCG/ACT inhaler Inhale 1 puff into the lungs every 6 (six) hours as needed for wheezing or shortness of breath.  18 g  11  . atorvastatin (LIPITOR) 20 MG tablet Take 20  mg by mouth daily.      . budesonide-formoterol (SYMBICORT) 160-4.5 MCG/ACT inhaler Inhale 2 puffs into the lungs 2 (two) times daily.      . carvedilol (COREG) 12.5 MG tablet Take 12.5 mg by mouth 2 (two) times daily with a meal.      . cetirizine (ZYRTEC) 10 MG tablet Take 1 tablet (10 mg total) by mouth daily.  30 tablet  11  . exenatide (BYETTA 5 MCG PEN) 5 MCG/0.02ML SOPN injection Inject 0.02 mLs (5 mcg total) into the skin 2 (two) times daily with a meal.  1.2 mL  3  . fluticasone (FLONASE) 50 MCG/ACT nasal spray Place 2 sprays into both nostrils daily.  16 g  6  . HYDROcodone-acetaminophen (NORCO) 10-325 MG per tablet Take 1 tablet by mouth 3 (three) times daily as needed for moderate pain. Do not fill before 08/12/2014  90 tablet  0  . ibuprofen (ADVIL,MOTRIN) 600 MG tablet Take 600 mg by mouth every 6 (six) hours as needed for mild pain or moderate pain.      Marland Kitchen insulin aspart (NOVOLOG) 100 UNIT/ML injection Inject 60 Units into the skin 3 (three) times daily with meals.  10 mL  5  . Insulin Glargine (LANTUS) 100 UNIT/ML Solostar Pen Inject 150 Units into the skin 2 (two) times daily.      Marland Kitchen lisinopril (PRINIVIL,ZESTRIL) 10 MG tablet Take 1 tablet (10 mg total) by mouth daily.  30 tablet  5  . metFORMIN (GLUCOPHAGE) 500 MG tablet Take 500 mg by mouth 2 (two) times daily with a meal.      .  nitroGLYCERIN (NITROSTAT) 0.4 MG SL tablet Place 1 tablet (0.4 mg total) under the tongue every 5 (five) minutes as needed for chest pain.  30 tablet  0  . pantoprazole (PROTONIX) 40 MG tablet Take 40 mg by mouth daily.      . SUMAtriptan (IMITREX) 50 MG tablet Take 1 tablet (50 mg total) by mouth once. May repeat in 2 hours if headache persists or recurs.  10 tablet  0  . traMADol (ULTRAM) 50 MG tablet Take 1 tablet (50 mg total) by mouth daily as needed for moderate pain.  15 tablet  1   No current facility-administered medications for this visit.    Objective: BP 124/88  Pulse 105  Temp(Src) 97.7  F (36.5 C) (Oral)  Ht 5\' 2"  (1.575 m)  Wt 331 lb (150.141 kg)  BMI 60.53 kg/m2  LMP 05/22/2014 Gen: NAD, alert, cooperative with exam HEENT: NCAT, EOMI, tenderness to temples bilaterally Ext: No edema, warm Neuro: Alert and oriented, strength 5/5 in bilateral upper and lower Cervone's, cranial nerve II through XII intact, normal gait, normal sensation in bilateral lower extremities, 2+ patellar tendon reflexes   Assessment/Plan:  Headache(784.0) Likely migraine given her recent admission No red flags Start upper treatment with Imitrex 50 mg Also stated that NSAIDs are okay Followup in 3 days for hospital followup.   Meds ordered this encounter  Medications  . SUMAtriptan (IMITREX) 50 MG tablet    Sig: Take 1 tablet (50 mg total) by mouth once. May repeat in 2 hours if headache persists or recurs.    Dispense:  10 tablet    Refill:  0

## 2014-07-15 NOTE — Assessment & Plan Note (Signed)
Likely migraine given her recent admission No red flags Start upper treatment with Imitrex 50 mg Also stated that NSAIDs are okay Followup in 3 days for hospital followup.

## 2014-07-18 ENCOUNTER — Encounter: Payer: Self-pay | Admitting: Family Medicine

## 2014-07-18 ENCOUNTER — Ambulatory Visit (INDEPENDENT_AMBULATORY_CARE_PROVIDER_SITE_OTHER): Payer: Medicaid Other | Admitting: Family Medicine

## 2014-07-18 VITALS — BP 130/86 | HR 99 | Temp 98.1°F | Ht 62.0 in | Wt 331.6 lb

## 2014-07-18 DIAGNOSIS — R51 Headache: Secondary | ICD-10-CM

## 2014-07-18 DIAGNOSIS — J329 Chronic sinusitis, unspecified: Secondary | ICD-10-CM

## 2014-07-18 MED ORDER — BUTALBITAL-APAP-CAFFEINE 50-325-40 MG PO TABS: 1.0000 | ORAL_TABLET | Freq: Four times a day (QID) | ORAL | Status: DC | PRN

## 2014-07-18 NOTE — Assessment & Plan Note (Signed)
Nasal pain has now improved after tx with augmentin but still has nasal congestion. CT & MRI head noted chronic sinusitis. At some point it may be beneficial to refer her to ENT for evaluation, although will hold off on doing this right now since we are focused on treating her migraines.

## 2014-07-18 NOTE — Assessment & Plan Note (Signed)
Neuro exam nonfocal. Suspect migraine which has not improved with imitrex. Will rx fioricet. Pt has been instructed to f/u with me next week for re-evaluation. She understands reasons which should prompt her to seek care sooner.

## 2014-07-18 NOTE — Patient Instructions (Signed)
It was great to see you again today!  Take fioricet as needed for headache.  Do NOT take any ibuprofen for at least one week.  Come back to see me next week to follow up on how you are doing.  Be well, Dr. [name]

## 2014-07-18 NOTE — Progress Notes (Signed)
Patient ID: [name], female   DOB: 1976/11/26, 38 y.o.   MRN: [id number]  HPI:  Pt presents today to f/u on her migraines and prior transient vision loss. On 8/6 evening began having unilateral R sided vision loss. This persisted into the next day. On 8/7 saw her ophthalmologist, who told her that she had "wavy" blood vessels in the back of her eye. He recommended a cardiac and neurological evaluation. She presented to her cardiologist's office, who did not see her but instead recommended going to a neurologist. She instead came to the [facility] and was admitted directly to the hospital out of concern for amaurosis fugax. She had a CT angio of her head and MRI of her head, which failed to show any major abnormalities other than chronic sinusitis. Neurology was consulted and believed her vision loss was most likely related to migraine as she began having a right sided headache. On 8/9 was discharged from the hospital headache free. On 8/10 began having left sided headache and presented to [facility], was given rx for imitrex. This has not helped her pain.  She has not had any further vision changes since being in the hospital. Has had blurry vision for several months but states she has a new rx for glasses that she has not gotten filled. Headache on the L side is throbbing, comes and goes. Has been taking ibuprofen 1600mg  each morning (two 800mg  tablets). Does have photosensitivity but no nausea. No prior hx of migraines in the past.   ROS: See HPI.  [place]: hx uncontrolled T2DM, HTN, asthma, GERD, HLD, morbid obesity, chronic opioids  PHYSICAL EXAM: BP 130/86  Pulse 99  Temp(Src) 98.1 F (36.7 C) (Oral)  Ht 5\' 2"  (1.575 m)  Wt 331 lb 9.6 oz (150.413 kg)  BMI 60.64 kg/m2  LMP 05/22/2014 Gen: NAD HEENT: NCAT, TM's normal bilaterally, canals mildly red but not infected Heart: RRR Lungs: CTAB Neuro: cranial nerves II-XII tested and intact. Normal FNF. Normal heel shin coordination. Normal gait. Negative  romberg. Speech normal, oriented x3. Ext: normal  ASSESSMENT/PLAN:  Headache(784.0) Neuro exam nonfocal. Suspect migraine which has not improved with imitrex. Will rx fioricet. Pt has been instructed to f/u with me next week for re-evaluation. She understands reasons which should prompt her to seek care sooner.  Chronic sinusitis Nasal pain has now improved after tx with augmentin but still has nasal congestion. CT & MRI head noted chronic sinusitis. At some point it may be beneficial to refer her to ENT for evaluation, although will hold off on doing this right now since we are focused on treating her migraines.   FOLLOW UP: F/u in less than 1 week for migraines  [name], [place]

## 2014-07-20 ENCOUNTER — Encounter (HOSPITAL_COMMUNITY): Payer: Self-pay | Admitting: Emergency Medicine

## 2014-07-20 ENCOUNTER — Telehealth: Payer: Self-pay | Admitting: Family Medicine

## 2014-07-20 ENCOUNTER — Emergency Department (HOSPITAL_COMMUNITY)
Admission: EM | Admit: 2014-07-20 | Discharge: 2014-07-20 | Discharge status: Against Medical Advice | Payer: Medicaid Other | Attending: Emergency Medicine | Admitting: Emergency Medicine

## 2014-07-20 DIAGNOSIS — J449 Chronic obstructive pulmonary disease, unspecified: Secondary | ICD-10-CM | POA: Diagnosis not present

## 2014-07-20 DIAGNOSIS — R51 Headache: Secondary | ICD-10-CM | POA: Insufficient documentation

## 2014-07-20 DIAGNOSIS — I10 Essential (primary) hypertension: Secondary | ICD-10-CM | POA: Insufficient documentation

## 2014-07-20 DIAGNOSIS — H547 Unspecified visual loss: Secondary | ICD-10-CM | POA: Insufficient documentation

## 2014-07-20 DIAGNOSIS — J4489 Other specified chronic obstructive pulmonary disease: Secondary | ICD-10-CM | POA: Insufficient documentation

## 2014-07-20 DIAGNOSIS — E119 Type 2 diabetes mellitus without complications: Secondary | ICD-10-CM | POA: Diagnosis not present

## 2014-07-20 NOTE — Telephone Encounter (Signed)
Emergency Line / After Hours Call  Pt called the emergency line due to sudden loss of vision in her L eye. She is well known to me, as she is my primary patient and I just saw her two days ago in clinic. She was admitted last weekend for RIGHT eye vision loss and had a neurological workup which was unrevealing and this was thought most likely to be related to a migraine. She has had a left sided headache this week without any vision changes until now. About five minutes ago, her left eye went completely black. No hx of migraines prior to last week. Out of an abundance of caution, I advised she come in to the emergency room to be assessed. Pt was agreeable to this plan and will have her husband bring her to the hospital.  [name], MD [facility] Medicine PGY-3

## 2014-07-20 NOTE — ED Notes (Addendum)
Pt reports loss of vision in left eye on Thursday with left sided HA. States today left side of body "felt weird." Pt neuro intact, grips equal, denies numbness tingling, no facial droop. Pt reports right eye vision 3 days ago that was dt migraine. Pt AO x4. NAD.

## 2014-07-21 ENCOUNTER — Encounter (HOSPITAL_COMMUNITY): Payer: Self-pay | Admitting: Emergency Medicine

## 2014-07-21 ENCOUNTER — Emergency Department (HOSPITAL_COMMUNITY)
Admission: EM | Admit: 2014-07-21 | Discharge: 2014-07-21 | Disposition: A | Discharge status: Home / Self Care | Payer: Medicaid Other | Attending: Emergency Medicine | Admitting: Emergency Medicine

## 2014-07-21 DIAGNOSIS — I10 Essential (primary) hypertension: Secondary | ICD-10-CM | POA: Diagnosis not present

## 2014-07-21 DIAGNOSIS — Z87891 Personal history of nicotine dependence: Secondary | ICD-10-CM | POA: Diagnosis not present

## 2014-07-21 DIAGNOSIS — Z862 Personal history of diseases of the blood and blood-forming organs and certain disorders involving the immune mechanism: Secondary | ICD-10-CM | POA: Insufficient documentation

## 2014-07-21 DIAGNOSIS — R209 Unspecified disturbances of skin sensation: Secondary | ICD-10-CM | POA: Insufficient documentation

## 2014-07-21 DIAGNOSIS — J45901 Unspecified asthma with (acute) exacerbation: Secondary | ICD-10-CM | POA: Diagnosis not present

## 2014-07-21 DIAGNOSIS — H539 Unspecified visual disturbance: Secondary | ICD-10-CM | POA: Diagnosis not present

## 2014-07-21 DIAGNOSIS — Z8669 Personal history of other diseases of the nervous system and sense organs: Secondary | ICD-10-CM | POA: Insufficient documentation

## 2014-07-21 DIAGNOSIS — Z8659 Personal history of other mental and behavioral disorders: Secondary | ICD-10-CM | POA: Diagnosis not present

## 2014-07-21 DIAGNOSIS — G43809 Other migraine, not intractable, without status migrainosus: Secondary | ICD-10-CM

## 2014-07-21 DIAGNOSIS — H5460 Unqualified visual loss, one eye, unspecified: Secondary | ICD-10-CM | POA: Insufficient documentation

## 2014-07-21 DIAGNOSIS — K219 Gastro-esophageal reflux disease without esophagitis: Secondary | ICD-10-CM | POA: Insufficient documentation

## 2014-07-21 DIAGNOSIS — Z79899 Other long term (current) drug therapy: Secondary | ICD-10-CM | POA: Diagnosis not present

## 2014-07-21 DIAGNOSIS — R5383 Other fatigue: Secondary | ICD-10-CM | POA: Diagnosis not present

## 2014-07-21 DIAGNOSIS — Z794 Long term (current) use of insulin: Secondary | ICD-10-CM | POA: Diagnosis not present

## 2014-07-21 DIAGNOSIS — E119 Type 2 diabetes mellitus without complications: Secondary | ICD-10-CM | POA: Diagnosis not present

## 2014-07-21 DIAGNOSIS — J441 Chronic obstructive pulmonary disease with (acute) exacerbation: Secondary | ICD-10-CM | POA: Diagnosis not present

## 2014-07-21 DIAGNOSIS — E785 Hyperlipidemia, unspecified: Secondary | ICD-10-CM | POA: Insufficient documentation

## 2014-07-21 DIAGNOSIS — R5381 Other malaise: Secondary | ICD-10-CM | POA: Diagnosis not present

## 2014-07-21 DIAGNOSIS — IMO0002 Reserved for concepts with insufficient information to code with codable children: Secondary | ICD-10-CM | POA: Insufficient documentation

## 2014-07-21 DIAGNOSIS — Z8739 Personal history of other diseases of the musculoskeletal system and connective tissue: Secondary | ICD-10-CM | POA: Diagnosis not present

## 2014-07-21 MED ORDER — SODIUM CHLORIDE 0.9 % IV BOLUS (SEPSIS)
1000.0000 mL | Freq: Once | INTRAVENOUS | Status: AC
  Administered 2014-07-21: 1000 mL via INTRAVENOUS

## 2014-07-21 MED ORDER — KETOROLAC TROMETHAMINE 30 MG/ML IJ SOLN
30.0000 mg | Freq: Once | INTRAMUSCULAR | Status: AC
  Administered 2014-07-21: 30 mg via INTRAVENOUS
  Filled 2014-07-21: qty 1

## 2014-07-21 MED ORDER — DIPHENHYDRAMINE HCL 50 MG/ML IJ SOLN
25.0000 mg | Freq: Once | INTRAMUSCULAR | Status: AC
  Administered 2014-07-21: 25 mg via INTRAVENOUS
  Filled 2014-07-21: qty 1

## 2014-07-21 MED ORDER — PROCHLORPERAZINE EDISYLATE 5 MG/ML IJ SOLN
10.0000 mg | Freq: Once | INTRAMUSCULAR | Status: AC
  Administered 2014-07-21: 10 mg via INTRAVENOUS
  Filled 2014-07-21: qty 2

## 2014-07-21 NOTE — ED Provider Notes (Signed)
CSN: [id number]     Arrival date & time 07/21/14  0709 History   First MD Initiated Contact with Patient 07/21/14 [phone]     Chief Complaint  Patient presents with  . Loss of Vision     (Consider location/radiation/quality/duration/timing/severity/associated sxs/prior Treatment) HPI  [name] is a 38 year old woman with DM2, HTN, and bipolar disorder who presents with bilateral vision loss. She was admitted on 8/7 for intermittent R eye vision loss and headache. She had a negative CTA/MRI and was evaluated by ophthalmology and neurology and discharged with the diagnosis of migraine. She has since had the headache for the past week. She describes it as 10/10 throbbing on the bilateral front of her head. She thinks that fioricet makes it a better but then it comes back. She also reports that yesterday morning she acutely lost vision in her entire L eye. She came to the ED and left after she thinks triage claimed it was a migraine as she didn't want to spend the day here. She says her vision returned to her L eye but this morning at about 6 am she acutely lost all vision in both eyes. She also thinks her left side feels heavy/weak.  Past Medical History  Diagnosis Date  . Morbid obesity   . Alveolar hypoventilation   . Asthma   . GERD (gastroesophageal reflux disease)   . Rectal fissure   . Hypertension   . Costochondritis   . Cellulitis 08/2010-08/2011  . Depression   . Bipolar 2 disorder   . Obstructive sleep apnea   . Chest pain     NO CAD by cath 7/32/20, nl LV systolic fxn  . Diabetes mellitus 2000    Type 2, Uncontrolled  . Arthritis   . HLD (hyperlipidemia)   . Anemia   . COPD (chronic obstructive pulmonary disease)    Past Surgical History  Procedure Laterality Date  . Carpal tunnel release    . Bil knee surgery    . Mass excision N/A 06/29/2013    Procedure:  WIDE LOCAL EXCISION OF POSTERIOR NECK ABSCESS;  Surgeon: Ralene Ok, MD;  Location: [place];  Service: General;   Laterality: N/A;  . Cardiac catheterization     Family History  Problem Relation Age of Onset  . Diabetes Mother   . Hyperlipidemia Mother   . Depression Mother   . Heart attack Paternal Uncle   . Heart disease Paternal Grandmother   . Heart attack Paternal Grandmother   . Heart attack Paternal Grandfather   . Heart disease Paternal Grandfather    History  Substance Use Topics  . Smoking status: Former Smoker -- 0.30 packs/day for .3 years    Types: Cigarettes    Quit date: 12/06/1993  . Smokeless tobacco: Never Used  . Alcohol Use: No   OB History   Grav Para Term Preterm Abortions TAB SAB Ect Mult Living                 Review of Systems  Constitutional: Negative for fever, chills and diaphoresis.  Eyes: Positive for visual disturbance. Negative for photophobia and pain.  Respiratory: Negative for shortness of breath.   Cardiovascular: Negative for chest pain and palpitations.  Gastrointestinal: Negative for nausea, vomiting, abdominal pain, diarrhea and constipation.  Neurological: Positive for weakness, numbness and headaches. Negative for syncope, facial asymmetry and speech difficulty.      Allergies  Kiwi extract and Nubain  Home Medications   Prior to Admission medications  Medication Sig Start Date End Date Taking? Authorizing Provider  atorvastatin (LIPITOR) 20 MG tablet Take 20 mg by mouth daily.   Yes Historical Provider, MD  budesonide-formoterol (SYMBICORT) 160-4.5 MCG/ACT inhaler Inhale 2 puffs into the lungs 2 (two) times daily.   Yes Historical Provider, MD  butalbital-acetaminophen-caffeine (FIORICET) 50-325-40 MG per tablet Take 1 tablet by mouth every 6 (six) hours as needed for headache. 07/18/14 07/18/15 Yes [name], MD  carvedilol (COREG) 12.5 MG tablet Take 12.5 mg by mouth 2 (two) times daily with a meal.   Yes Historical Provider, MD  cetirizine (ZYRTEC) 10 MG tablet Take 1 tablet (10 mg total) by mouth daily. 06/11/14  Yes [name], MD  exenatide (BYETTA 5 MCG PEN) 5 MCG/0.02ML SOPN injection Inject 0.02 mLs (5 mcg total) into the skin 2 (two) times daily with a meal. 07/04/14  Yes Jayce G Cook, DO  fluticasone (FLONASE) 50 MCG/ACT nasal spray Place 2 sprays into both nostrils daily. 05/21/14  Yes Melony Overly, MD  HYDROcodone-acetaminophen (NORCO) 10-325 MG per tablet Take 1 tablet by mouth 3 (three) times daily as needed for moderate pain. Do not fill before 08/12/2014 07/03/14  Yes [name], MD  ibuprofen (ADVIL,MOTRIN) 600 MG tablet Take 600 mg by mouth every 6 (six) hours as needed for mild pain or moderate pain.   Yes Historical Provider, MD  insulin aspart (NOVOLOG) 100 UNIT/ML injection Inject 60 Units into the skin 3 (three) times daily with meals. 03/12/14  Yes [name], MD  Insulin Glargine (LANTUS) 100 UNIT/ML Solostar Pen Inject 150 Units into the skin 2 (two) times daily. 07/30/13  Yes [name], MD  lisinopril (PRINIVIL,ZESTRIL) 10 MG tablet Take 1 tablet (10 mg total) by mouth daily. 03/12/14  Yes [name], MD  metFORMIN (GLUCOPHAGE) 500 MG tablet Take 500 mg by mouth 2 (two) times daily with a meal.   Yes Historical Provider, MD  pantoprazole (PROTONIX) 40 MG tablet Take 40 mg by mouth daily.   Yes Historical Provider, MD  SUMAtriptan (IMITREX) 50 MG tablet Take 1 tablet (50 mg total) by mouth once. May repeat in 2 hours if headache persists or recurs. 07/15/14  Yes Timmothy Euler, MD  traMADol (ULTRAM) 50 MG tablet Take 1 tablet (50 mg total) by mouth daily as needed for moderate pain. 07/03/14  Yes [name], MD  albuterol (PROVENTIL HFA;VENTOLIN HFA) 108 (90 BASE) MCG/ACT inhaler Inhale 1 puff into the lungs every 6 (six) hours as needed for wheezing or shortness of breath. 06/11/14   [name], MD  nitroGLYCERIN (NITROSTAT) 0.4 MG SL tablet Place 1 tablet (0.4 mg total) under the tongue every 5 (five) minutes as needed for chest pain. 07/09/14    Jayce G Cook, DO   BP 126/81  Pulse 86  Temp(Src) 98.3 F (36.8 C) (Oral)  Resp 18  Ht 5\' 2"  (1.575 m)  Wt 330 lb (149.687 kg)  BMI 60.34 kg/m2  SpO2 98%  LMP 05/22/2014 Physical Exam  Vitals reviewed. Constitutional: She is oriented to person, place, and time. She appears well-developed and well-nourished. No distress.  HENT:  Mouth/Throat: Oropharynx is clear and moist.  Eyes: EOM are normal. Pupils are equal, round, and reactive to light. Right eye exhibits no discharge. Left eye exhibits no discharge. No scleral icterus.  Cardiovascular: Normal rate, regular rhythm, normal heart sounds and intact distal pulses.   Pulmonary/Chest: Effort normal and breath sounds normal.  Abdominal:  Soft. Bowel sounds are normal. She exhibits no distension. There is no tenderness.  Musculoskeletal: She exhibits no edema.  Neurological: She is alert and oriented to person, place, and time. She has normal reflexes. No cranial nerve deficit. She exhibits normal muscle tone.  PERRL but patient denies any visual acuity including to light. Coordination not assessed as patient said she could not see to touch my hand  Skin: She is not diaphoretic.    ED Course  Procedures (including critical care time) Labs Review Labs Reviewed - No data to display  Imaging Review No results found.   EKG Interpretation None      MDM   Final diagnoses:  None    8:27AM: Patient with intermittent vision loss associated with a throbbing headache. She has already had a negative ophthalmology and neurology evaluation with negative head CTA/MRI in the past week. While this is likely migraine, differential still includes pseudotumor cerebri and work-up would be complete with LP. Will give compazine 10 mg, toradol 30 mg, benadryl 25 mg, and 1 L NS bolus for now for headache.  9:49AM: Patient headache much improved and her vision is back bilaterally. Dr. Dina Rich explained that the next step in diagnostic workup is  lumbar puncture and the patient declined.   11:01AM: Patient resting comfortably in bed. She has no headache and vision is back to baseline in both eyes. We again discussed LP which she refuses. She says she will schedule close follow-up with her PCP early this week.  Kelby Aline, MD 07/21/14 [place], MD 07/22/14 [phone]

## 2014-07-21 NOTE — Discharge Instructions (Signed)
You were seen today for your headache and vision changes. They both resolved with compazine, toradol, benadryl, and fluids. We discussed the next diagnostic test would be a lumbar puncture which you refuse. Please schedule follow-up with your PCP to be seen early this week. Please seek medical attention or return to the ED if you have any new or worsening headache, vision changes, or other worrisome medical condition.

## 2014-07-21 NOTE — ED Notes (Signed)
Pt reports loss of vision ongoing since 07/11/2014. Pt was admitted to hospital. Pt seen by PMD for same with diagnosis of migraines. Pt reports here yesterday and was told she had a migraine but she does not feel this is what it is. Pt c/o continue to have loss of vision in B/L eyes and heaviness to left side since yesterday. Speech clear. No facial droop. Grips unequal R>L.

## 2014-07-22 ENCOUNTER — Telehealth: Payer: Self-pay | Admitting: Family Medicine

## 2014-07-22 NOTE — Telephone Encounter (Signed)
Left voice message for pt informing her that form is completed and if she wanted office to fax it to the number provided.  Wait for pt to return call.  Derl Barrow, RN

## 2014-07-22 NOTE — ED Provider Notes (Signed)
I saw and evaluated the patient, reviewed the resident's note and I agree with the findings and plan.   EKG Interpretation None      Patient presents with headache and vision loss. Patient reports waxing and waning vision loss in August 6. She was admitted to the hospital and had a neurology evaluation as well as MRI to rule out MS. She was diagnosed with migraines. She is also seeing an outpatient ophthalmologist and was told "nothing is wrong with her eyes." Patient states that this morning she lost vision in her eyes bilaterally. She reports complete blackout. Denies peripheral vision loss. Also reports an 8/10 headache. He states that the migraine medications that she received are not helping. On my evaluation, patient reports improvement of headache following a migraine cocktail given by the resident. She also reports complete resolution of her vision loss. She is nontoxic and nonfocal on exam. She is morbidly obese. Discussed with patient that given that her vision changes improved with migraine cocktail, believe this is likely a complex migraine. However, patient was not ruled out for pseudotumor and discussed with her possibly obtaining an LP to rule out pseudotumor. Patient wishes to forego this further evaluation at this time given that she is improved. I have low suspicion at this time that this is pseudotumor given waxing and waning nature of her vision changes. We'll have her followup as an outpatient.  Merryl Hacker, MD 07/22/14 [phone]

## 2014-07-22 NOTE — Telephone Encounter (Signed)
Form for home care services completed per pt request. Will give to [place]. Please contact pt and tell her the forms are ready. She may ask that we just fax it to the number on the form, but please call and give her the option to pick it up.  [name], MD

## 2014-07-23 ENCOUNTER — Telehealth: Payer: Self-pay | Admitting: Family Medicine

## 2014-07-23 NOTE — Telephone Encounter (Signed)
Spoke with Pt regarding form is completed and faxed to numbers provided.  Forms copied for scanning in pt's record.  Pt will pick up original copy.  Derl Barrow, RN

## 2014-07-23 NOTE — Telephone Encounter (Signed)
[facility] called and wanted the doctor to know that her PHQ9 was 5 and that she is requesting a referral for her migraines. jw

## 2014-07-29 ENCOUNTER — Ambulatory Visit: Payer: Medicaid Other | Admitting: Endocrinology

## 2014-07-29 ENCOUNTER — Ambulatory Visit: Payer: Medicaid Other | Admitting: Family Medicine

## 2014-07-29 ENCOUNTER — Encounter: Payer: Self-pay | Admitting: Family Medicine

## 2014-07-29 ENCOUNTER — Ambulatory Visit (INDEPENDENT_AMBULATORY_CARE_PROVIDER_SITE_OTHER): Payer: Medicaid Other | Admitting: Family Medicine

## 2014-07-29 VITALS — BP 121/83 | HR 93 | Temp 98.1°F | Ht 62.0 in | Wt 323.5 lb

## 2014-07-29 DIAGNOSIS — I10 Essential (primary) hypertension: Secondary | ICD-10-CM | POA: Diagnosis not present

## 2014-07-29 DIAGNOSIS — R51 Headache: Secondary | ICD-10-CM

## 2014-07-29 DIAGNOSIS — G43901 Migraine, unspecified, not intractable, with status migrainosus: Secondary | ICD-10-CM

## 2014-07-29 DIAGNOSIS — G2581 Restless legs syndrome: Secondary | ICD-10-CM | POA: Diagnosis not present

## 2014-07-29 DIAGNOSIS — Z7189 Other specified counseling: Secondary | ICD-10-CM | POA: Diagnosis not present

## 2014-07-29 DIAGNOSIS — L089 Local infection of the skin and subcutaneous tissue, unspecified: Secondary | ICD-10-CM | POA: Diagnosis not present

## 2014-07-29 MED ORDER — KETOROLAC TROMETHAMINE 30 MG/ML IJ SOLN
30.0000 mg | Freq: Once | INTRAMUSCULAR | Status: AC
Start: 2014-07-29 — End: 2014-07-29
  Administered 2014-07-29: 30 mg via INTRAMUSCULAR

## 2014-07-29 MED ORDER — PROMETHAZINE HCL 25 MG/ML IJ SOLN
12.5000 mg | Freq: Once | INTRAMUSCULAR | Status: AC
  Administered 2014-07-29: 12.5 mg via INTRAMUSCULAR

## 2014-07-29 NOTE — Patient Instructions (Signed)
It was great to see you again today!  I am referring you to neurology for your migraines and vision changes. You will get a phone call to schedule this appointment.   Follow up with me in 2-3 weeks so we can touch base on how you are doing.  Be well, Dr. [name]
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
## 2014-07-29 NOTE — Telephone Encounter (Signed)
Noted  

## 2014-07-30 ENCOUNTER — Ambulatory Visit: Payer: Medicaid Other | Admitting: Family Medicine

## 2014-07-31 NOTE — Assessment & Plan Note (Signed)
Neuro exam remains nonfocal. Pt having headache now. Had some relief for a few days with migraine cocktail in ER, so will give toradol 30mg  IM and phenergan 12.5mg  IM here today in clinic.  The ER physician brought up a good point that a lumbar puncture would contribute to the workup. I also wonder if carotid dopplers would be useful. Will enter urgent referral to neurology for additional evaluation and management.

## 2014-08-05 ENCOUNTER — Other Ambulatory Visit: Payer: Self-pay | Admitting: Family Medicine

## 2014-08-06 IMAGING — CR DG CHEST 2V
2 series · 2 of 2 positions shown · non-contrast
Comparison: 07/27/2012

CLINICAL DATA: Preop radiograph.  Excision of neck abscess.

CHEST - 2 VIEW

[w chest pa]
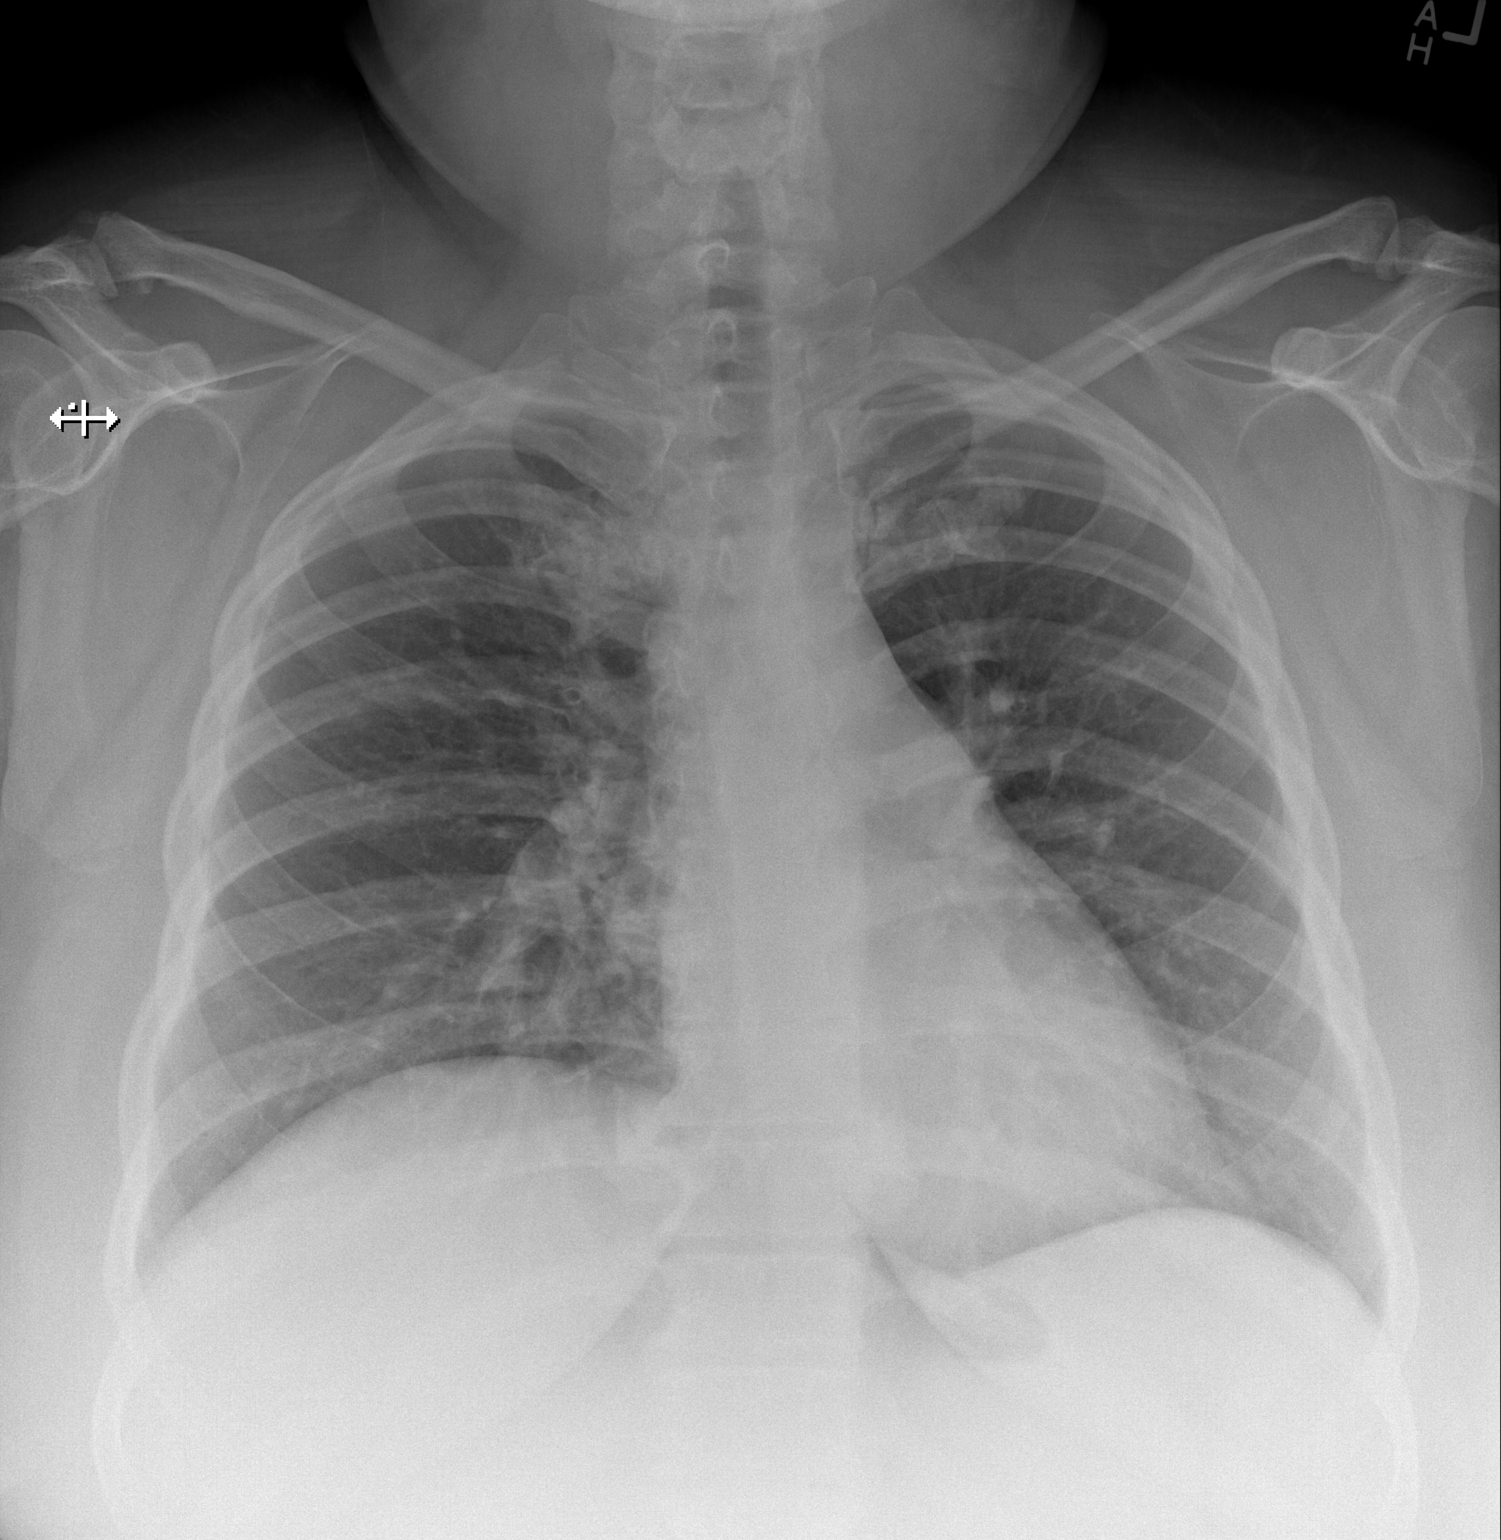

[w chest lat]
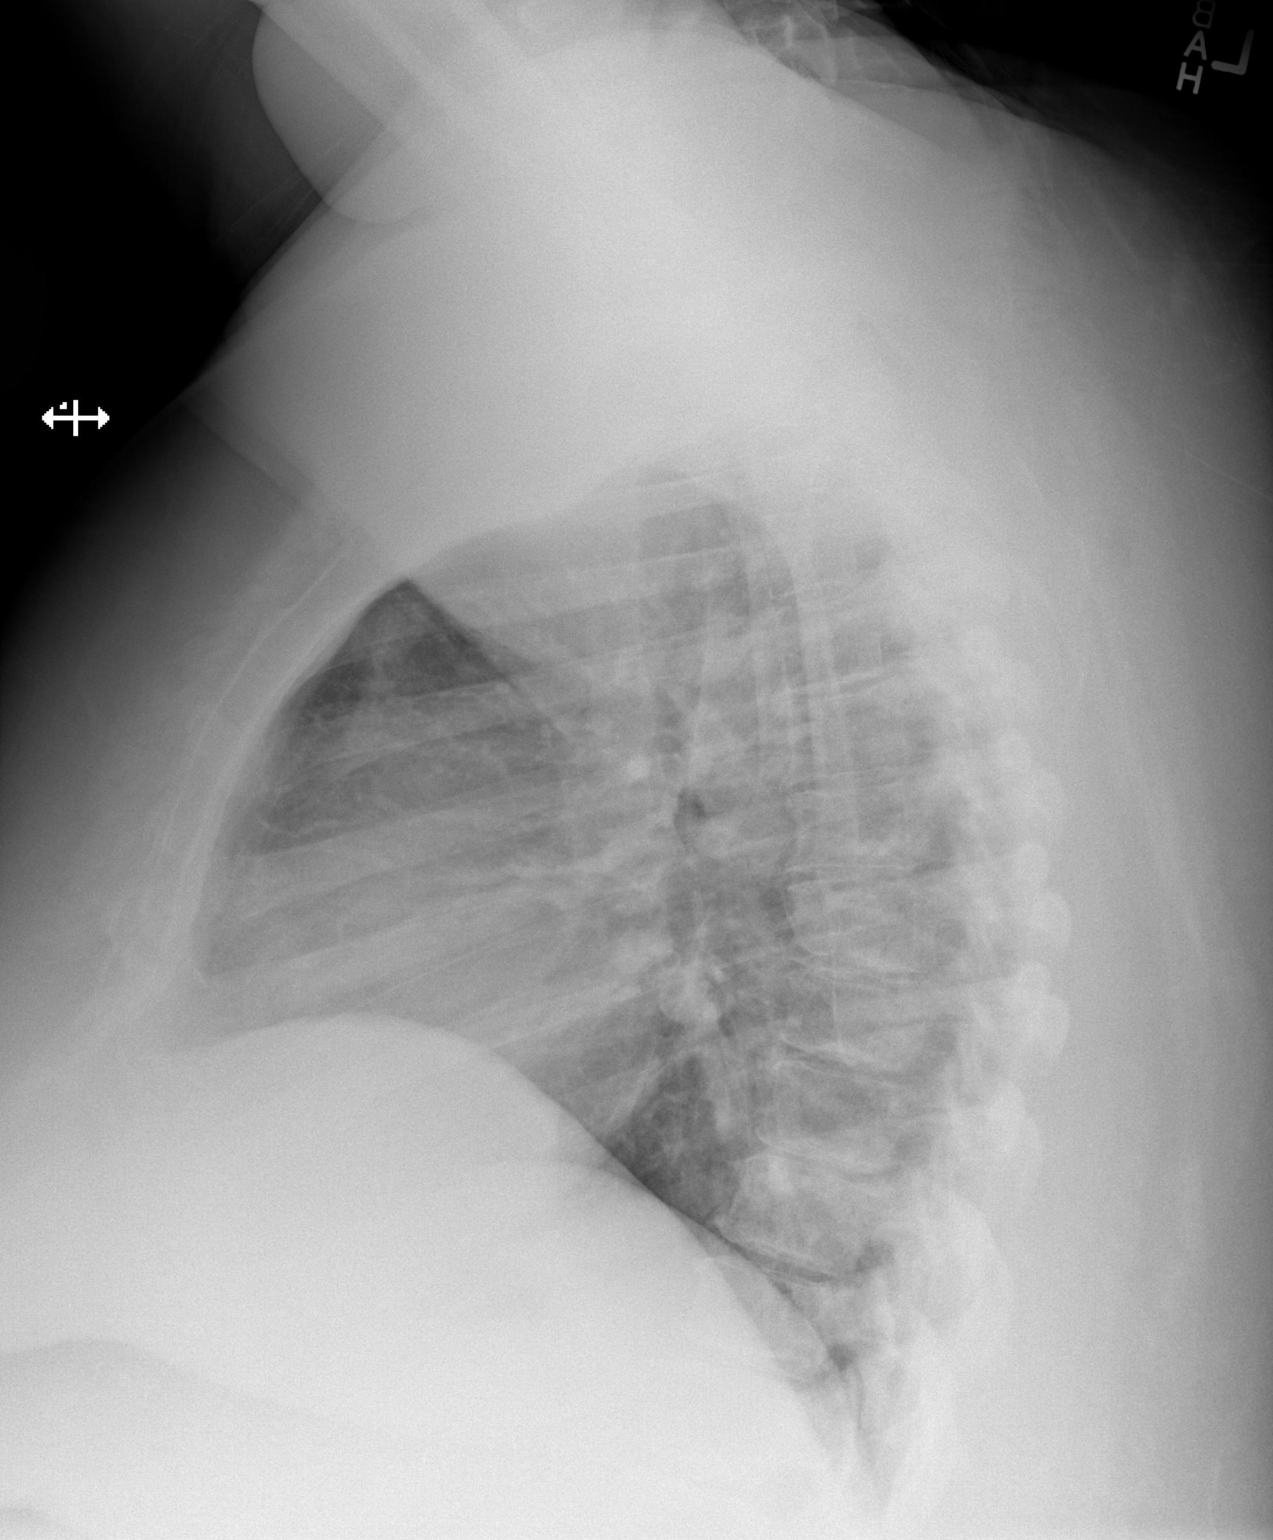

[2 of 2 positions shown; findings below may reference images not displayed]

FINDINGS: Heart size is normal.  No pleural effusion or edema.  No
airspace consolidation.  There is slight asymmetric elevation of
the right hemidiaphragm.
IMPRESSION: 1.  No acute cardiopulmonary abnormalities.
2.  Asymmetric elevation of the right hemidiaphragm.

## 2014-08-07 ENCOUNTER — Emergency Department (HOSPITAL_COMMUNITY)
Admission: EM | Admit: 2014-08-07 | Discharge: 2014-08-07 | Disposition: A | Discharge status: Home / Self Care | Payer: Medicaid Other | Attending: Emergency Medicine | Admitting: Emergency Medicine

## 2014-08-07 ENCOUNTER — Encounter (HOSPITAL_COMMUNITY): Payer: Self-pay | Admitting: Emergency Medicine

## 2014-08-07 ENCOUNTER — Ambulatory Visit: Payer: Medicaid Other | Admitting: Endocrinology

## 2014-08-07 DIAGNOSIS — IMO0002 Reserved for concepts with insufficient information to code with codable children: Secondary | ICD-10-CM | POA: Diagnosis not present

## 2014-08-07 DIAGNOSIS — I10 Essential (primary) hypertension: Secondary | ICD-10-CM | POA: Insufficient documentation

## 2014-08-07 DIAGNOSIS — E785 Hyperlipidemia, unspecified: Secondary | ICD-10-CM | POA: Diagnosis not present

## 2014-08-07 DIAGNOSIS — Z8659 Personal history of other mental and behavioral disorders: Secondary | ICD-10-CM | POA: Insufficient documentation

## 2014-08-07 DIAGNOSIS — Z862 Personal history of diseases of the blood and blood-forming organs and certain disorders involving the immune mechanism: Secondary | ICD-10-CM | POA: Insufficient documentation

## 2014-08-07 DIAGNOSIS — Z79899 Other long term (current) drug therapy: Secondary | ICD-10-CM | POA: Diagnosis not present

## 2014-08-07 DIAGNOSIS — J45901 Unspecified asthma with (acute) exacerbation: Secondary | ICD-10-CM

## 2014-08-07 DIAGNOSIS — Z872 Personal history of diseases of the skin and subcutaneous tissue: Secondary | ICD-10-CM | POA: Diagnosis not present

## 2014-08-07 DIAGNOSIS — J441 Chronic obstructive pulmonary disease with (acute) exacerbation: Secondary | ICD-10-CM | POA: Insufficient documentation

## 2014-08-07 DIAGNOSIS — Z8669 Personal history of other diseases of the nervous system and sense organs: Secondary | ICD-10-CM | POA: Diagnosis not present

## 2014-08-07 DIAGNOSIS — K219 Gastro-esophageal reflux disease without esophagitis: Secondary | ICD-10-CM | POA: Insufficient documentation

## 2014-08-07 DIAGNOSIS — E119 Type 2 diabetes mellitus without complications: Secondary | ICD-10-CM | POA: Insufficient documentation

## 2014-08-07 DIAGNOSIS — M129 Arthropathy, unspecified: Secondary | ICD-10-CM | POA: Diagnosis not present

## 2014-08-07 DIAGNOSIS — R51 Headache: Secondary | ICD-10-CM | POA: Insufficient documentation

## 2014-08-07 DIAGNOSIS — Z794 Long term (current) use of insulin: Secondary | ICD-10-CM | POA: Insufficient documentation

## 2014-08-07 DIAGNOSIS — R519 Headache, unspecified: Secondary | ICD-10-CM

## 2014-08-07 MED ORDER — SODIUM CHLORIDE 0.9 % IV BOLUS (SEPSIS)
1000.0000 mL | Freq: Once | INTRAVENOUS | Status: AC
  Administered 2014-08-07: 1000 mL via INTRAVENOUS

## 2014-08-07 MED ORDER — KETOROLAC TROMETHAMINE 30 MG/ML IJ SOLN
30.0000 mg | Freq: Once | INTRAMUSCULAR | Status: AC
  Administered 2014-08-07: 30 mg via INTRAVENOUS
  Filled 2014-08-07: qty 1

## 2014-08-07 MED ORDER — PROCHLORPERAZINE EDISYLATE 5 MG/ML IJ SOLN
10.0000 mg | Freq: Once | INTRAMUSCULAR | Status: AC
  Administered 2014-08-07: 10 mg via INTRAVENOUS
  Filled 2014-08-07: qty 2

## 2014-08-07 NOTE — ED Notes (Signed)
Pt presents to ED with c/o migraine headache.  Pt reports that she started having the migraine attack "4 days ago"--- states headache is getting progressively worse with nausea, no dizziness.

## 2014-08-07 NOTE — Discharge Instructions (Signed)
Continue all home medications. Follow-up with your primary care physician. Return to the ED for new concerns.

## 2014-08-07 NOTE — ED Provider Notes (Signed)
CSN: [id number]     Arrival date & time 08/07/14  0556 History   First MD Initiated Contact with Patient 08/07/14 [phone]     Chief Complaint  Patient presents with  . Migraine     (Consider location/radiation/quality/duration/timing/severity/associated sxs/prior Treatment) The history is provided by the patient and medical records.   This is a 38 y.o. F with PMH significant for HTN, GERD, bipolar 2 disorder, OSA, DM2, HLP, COPD, presenting to the ED for headache.  Patient states she has a hx of these headaches and has recently undergone extensive work-up for this at [facility].  States current headache started 4 days ago, generalized throughout her entire head, described as a progressively worsening throbbing sensation.  Endorses associated photophobia, phonophobia, nausea, and intermittent blurred vision.  States visual changes not abnormal for her.  Denies dizziness, lightheadedness, changes in speech, tinnitus, confusion, aphasia, dis-coordination, difficulty walking, numbness, paresthesias, or weakness of extremities.  States her PCP has started her on fioricet PRN headaches which she took yesterday without relief.  Has FU appt with neurology scheduled for October for management of headaches.   Past Medical History  Diagnosis Date  . Morbid obesity   . Alveolar hypoventilation   . Asthma   . GERD (gastroesophageal reflux disease)   . Rectal fissure   . Hypertension   . Costochondritis   . Cellulitis 08/2010-08/2011  . Depression   . Bipolar 2 disorder   . Obstructive sleep apnea   . Chest pain     NO CAD by cath 07/30/04, nl LV systolic fxn  . Diabetes mellitus 2000    Type 2, Uncontrolled  . Arthritis   . HLD (hyperlipidemia)   . Anemia   . COPD (chronic obstructive pulmonary disease)    Past Surgical History  Procedure Laterality Date  . Carpal tunnel release    . Bil knee surgery    . Mass excision N/A 06/29/2013    Procedure:  WIDE LOCAL EXCISION OF POSTERIOR NECK ABSCESS;   Surgeon: Ralene Ok, MD;  Location: [place];  Service: General;  Laterality: N/A;  . Cardiac catheterization     Family History  Problem Relation Age of Onset  . Diabetes Mother   . Hyperlipidemia Mother   . Depression Mother   . Heart attack Paternal Uncle   . Heart disease Paternal Grandmother   . Heart attack Paternal Grandmother   . Heart attack Paternal Grandfather   . Heart disease Paternal Grandfather    History  Substance Use Topics  . Smoking status: Former Smoker -- 0.30 packs/day for .3 years    Types: Cigarettes    Quit date: 12/06/1993  . Smokeless tobacco: Never Used  . Alcohol Use: No   OB History   Grav Para Term Preterm Abortions TAB SAB Ect Mult Living                 Review of Systems  Gastrointestinal: Positive for nausea.  Neurological: Positive for headaches.  All other systems reviewed and are negative.     Allergies  Kiwi extract and Nubain  Home Medications   Prior to Admission medications   Medication Sig Start Date End Date Taking? Authorizing Provider  albuterol (PROVENTIL HFA;VENTOLIN HFA) 108 (90 BASE) MCG/ACT inhaler Inhale 1 puff into the lungs every 6 (six) hours as needed for wheezing or shortness of breath. 06/11/14  Yes [name], MD  atorvastatin (LIPITOR) 20 MG tablet Take 20 mg by mouth daily.   Yes Historical Provider,  MD  budesonide-formoterol (SYMBICORT) 160-4.5 MCG/ACT inhaler Inhale 2 puffs into the lungs 2 (two) times daily.   Yes Historical Provider, MD  butalbital-acetaminophen-caffeine (FIORICET) 50-325-40 MG per tablet Take 1 tablet by mouth every 6 (six) hours as needed for headache. 07/18/14 07/18/15 Yes [name], MD  carvedilol (COREG) 12.5 MG tablet Take 12.5 mg by mouth 2 (two) times daily with a meal.   Yes Historical Provider, MD  cetirizine (ZYRTEC) 10 MG tablet Take 1 tablet (10 mg total) by mouth daily. 06/11/14  Yes [name], MD  exenatide (BYETTA 5 MCG PEN) 5 MCG/0.02ML SOPN  injection Inject 0.02 mLs (5 mcg total) into the skin 2 (two) times daily with a meal. 07/04/14  Yes Jayce G Cook, DO  fluticasone (FLONASE) 50 MCG/ACT nasal spray Place 2 sprays into both nostrils daily. 05/21/14  Yes Melony Overly, MD  HYDROcodone-acetaminophen (NORCO) 10-325 MG per tablet Take 1 tablet by mouth 3 (three) times daily as needed for moderate pain. Do not fill before 08/12/2014 07/03/14  Yes [name], MD  insulin aspart (NOVOLOG) 100 UNIT/ML injection Inject 60 Units into the skin 3 (three) times daily with meals. 03/12/14  Yes [name], MD  Insulin Glargine (LANTUS) 100 UNIT/ML Solostar Pen Inject 150 Units into the skin 2 (two) times daily. 07/30/13  Yes [name], MD  lisinopril (PRINIVIL,ZESTRIL) 10 MG tablet Take 1 tablet (10 mg total) by mouth daily. 03/12/14  Yes [name], MD  metFORMIN (GLUCOPHAGE) 500 MG tablet Take 500 mg by mouth 2 (two) times daily with a meal.   Yes Historical Provider, MD  nitroGLYCERIN (NITROSTAT) 0.4 MG SL tablet Place 1 tablet (0.4 mg total) under the tongue every 5 (five) minutes as needed for chest pain. 07/09/14  Yes Jayce G Cook, DO  pantoprazole (PROTONIX) 40 MG tablet Take 40 mg by mouth daily.   Yes Historical Provider, MD  traMADol (ULTRAM) 50 MG tablet Take 50 mg by mouth every 6 (six) hours as needed for moderate pain.   Yes Historical Provider, MD   BP 134/75  Pulse 92  Temp(Src) 97.7 F (36.5 C) (Oral)  Resp 18  SpO2 97%  LMP 05/22/2014  Physical Exam  Nursing note and vitals reviewed. Constitutional: She is oriented to person, place, and time. She appears well-developed and well-nourished. No distress.  Lying in bed with all lights off, cell phone in hand  HENT:  Head: Normocephalic and atraumatic.  Mouth/Throat: Oropharynx is clear and moist.  Eyes: Conjunctivae and EOM are normal. Pupils are equal, round, and reactive to light.  Neck: Normal range of motion and full passive range of motion  without pain. Neck supple. No rigidity.  No meningeal signs; full ROM without pain  Cardiovascular: Normal rate, regular rhythm and normal heart sounds.   Pulmonary/Chest: Effort normal and breath sounds normal. No respiratory distress. She has no wheezes.  Abdominal: Soft. Bowel sounds are normal. There is no tenderness. There is no guarding.  Musculoskeletal: Normal range of motion.  Neurological: She is alert and oriented to person, place, and time.  AAOx3, answering questions appropriately; equal strength UE and LE bilaterally; CN grossly intact; moves all extremities appropriately without ataxia; no focal neuro deficits or facial asymmetry appreciated  Skin: Skin is warm and dry. She is not diaphoretic.  Psychiatric: She has a normal mood and affect.  Speech clear, goal oriented    ED Course  Procedures (including critical care time) Labs Review Labs Reviewed -  No data to display  Imaging Review No results found.   EKG Interpretation None      MDM   Final diagnoses:  Headache, unspecified headache type   38 y.o F presenting with migraine headache, hx of same with similar sx.  Has recently undergone extensive work-up and intermittent vision loss with headaches by opthalmology and neurology.  On exam, patient afebrile and non-toxic appearing.  No nuchal rigidity to suggest meningitis.  Neurologic exam is non-focal, low suspsicion for intracranial pathology at this time.  Relief with migraine cocktail in the past, will use the same today.  On repeat evaluation, patient resting comfortably.  States headache has resolved and she is feeling better.  Neurologic exam remains non-focal.  Husband states she has a FU with her PCP scheduled for later today which i have encouraged her to keep. Discussed plan with patient, he/she acknowledged understanding and agreed with plan of care.  Return precautions given for new or worsening symptoms.  Larene Pickett, PA-C 08/07/14 [phone]

## 2014-08-07 NOTE — ED Provider Notes (Signed)
Medical screening examination/treatment/procedure(s) were performed by non-physician practitioner and as supervising physician I was immediately available for consultation/collaboration.   EKG Interpretation None       Everlene Balls, MD 08/07/14 [phone]

## 2014-08-08 ENCOUNTER — Ambulatory Visit: Payer: Medicaid Other | Admitting: Family Medicine

## 2014-08-26 ENCOUNTER — Encounter: Payer: Self-pay | Admitting: Family Medicine

## 2014-08-26 ENCOUNTER — Ambulatory Visit (INDEPENDENT_AMBULATORY_CARE_PROVIDER_SITE_OTHER): Payer: Medicaid Other | Admitting: Family Medicine

## 2014-08-26 VITALS — BP 130/80 | HR 96 | Temp 97.8°F | Wt 319.8 lb

## 2014-08-26 DIAGNOSIS — Z7189 Other specified counseling: Secondary | ICD-10-CM

## 2014-08-26 DIAGNOSIS — L089 Local infection of the skin and subcutaneous tissue, unspecified: Secondary | ICD-10-CM

## 2014-08-26 DIAGNOSIS — R51 Headache: Secondary | ICD-10-CM

## 2014-08-26 DIAGNOSIS — G8929 Other chronic pain: Secondary | ICD-10-CM

## 2014-08-26 MED ORDER — HYDROCODONE-ACETAMINOPHEN 10-325 MG PO TABS: 1.0000 | ORAL_TABLET | Freq: Three times a day (TID) | ORAL | Status: DC | PRN

## 2014-08-26 MED ORDER — TOPIRAMATE 25 MG PO TABS: 25.0000 mg | ORAL_TABLET | Freq: Every day | ORAL | Status: DC

## 2014-08-26 MED ORDER — TRAMADOL HCL 50 MG PO TABS: 50.0000 mg | ORAL_TABLET | Freq: Every day | ORAL | Status: DC | PRN

## 2014-08-26 MED ORDER — CLINDAMYCIN HCL 300 MG PO CAPS: 300.0000 mg | ORAL_CAPSULE | Freq: Four times a day (QID) | ORAL | Status: DC

## 2014-08-26 NOTE — Patient Instructions (Signed)
It was great to see you again today!  Chronic pain: -refilled pain medicines for 2 months -f/u in 2 mos for this  Headaches: -starting topamax daily at bedtime -keep appointment with neurology -If you have any thoughts of hurting yourself or anyone else, go to the Emergency Room to stay safe.   Boil: -take clindamycin 4 times daily for a week -return in 1 week to be rechecked  Be well, Dr. [name]

## 2014-08-26 NOTE — Assessment & Plan Note (Signed)
Has neuro appt in a few weeks. Unfortunately despite my putting in the referral as "urgent" they were not able to get her in sooner. Our options for migraine abortive therapy are limited as multiple medications have not worked (toradol, phenergan, fioricet, imitrex). Will start topamax 25mg  QHS as migraine prophylaxis in the meantime. Advised pt I am okay with her taking ibuprofen 600mg  total per day for HA.

## 2014-08-26 NOTE — Assessment & Plan Note (Signed)
Stable. Refilled norco and tramadol x 2 months.

## 2014-08-26 NOTE — Progress Notes (Signed)
Patient ID: [name], female   DOB: 03-06-1976, 38 y.o.   MRN: [id number]  HPI:  Boil: First noticed 2 weeks ago put hot compress on it. It spontaneously drained pus and blood, one day later. Now it's an open sore. No fevers. Not draining. Now is just open. Tender and red around it. Located in genital area. Husband thinks getting better. Has been using neosporin.  Headaches: has daily HA. Wakes up with them every day. Gets vision blackout 4-5 times a week which lasts 10 mins to 3 hours. Not driving. Phenergan & toradol didn't help last time. Fioricet has not helped either.  Chronic pain: needs refill on norco and tramadol. Takes norco 10-325mg  TID, also tramadol daily as needed (15 pills per month total). Doing well on this regimen. It allows her to do her ADL's without significant pain, also to walk for weight loss.  ROS: See HPI. Denies SI/HI.  [place]: hx T2DM poorly controlled, GERD, HTN, HLD, asthma  PHYSICAL EXAM: BP 130/80  Pulse 96  Temp(Src) 97.8 F (36.6 C) (Oral)  Wt 319 lb 12.8 oz (145.06 kg)  LMP 08/12/2014 Gen: NAD HEENT: NCAT, MMM Heart: RRR no murmurs Lungs: CTAB, NWOB Neuro: cranial nerves II-XII tested and intact. Speech normal. Full strength bilat upper and lower ext. Normal FNF. Ext: knees without obvious effusion, warmth, or trauma bilat. Mild TTP bilat joint lines. Skin: ulcerated circular lesion which is 2cm in diameter in R mons pubis. Some purulent drainage and surrounding erythema. No induration or significant TTP.  ASSESSMENT/PLAN:  Headache(784.0) Has neuro appt in a few weeks. Unfortunately despite my putting in the referral as "urgent" they were not able to get her in sooner. Our options for migraine abortive therapy are limited as multiple medications have not worked (toradol, phenergan, fioricet, imitrex). Will start topamax 25mg  QHS as migraine prophylaxis in the meantime. Advised pt I am okay with her taking ibuprofen 600mg  total per day for  HA.  Encounter for chronic pain management Stable. Refilled norco and tramadol x 2 months.  Skin infection Spontaneously open/draining but will likely not heal well on its own due to location and pt's morbid obesity. Would benefit from abx coverage. Limited in options as she is allergic to doxycycline, and want to cover for MRSA. Will do clindamycin for both MRSA and strep coverage (clinda is relatively good for MRSA in our community). Duoderm dressing applied today by nursing staff. F/u in 1 week for recheck. Precepted with Dr. Gwendlyn Deutscher who also examined patient and agrees with this plan.    FOLLOW UP: F/u in 1 week for skin wound recheck Keep appt with neurology for headache eval F/u in 2 mos for chronic pain.  [place]. [name], [place]

## 2014-08-26 NOTE — Assessment & Plan Note (Signed)
Spontaneously open/draining but will likely not heal well on its own due to location and pt's morbid obesity. Would benefit from abx coverage. Limited in options as she is allergic to doxycycline, and want to cover for MRSA. Will do clindamycin for both MRSA and strep coverage (clinda is relatively good for MRSA in our community). Duoderm dressing applied today by nursing staff. F/u in 1 week for recheck. Precepted with Dr. Gwendlyn Deutscher who also examined patient and agrees with this plan.

## 2014-08-27 ENCOUNTER — Other Ambulatory Visit: Payer: Self-pay | Admitting: Family Medicine

## 2014-09-05 ENCOUNTER — Ambulatory Visit: Payer: Medicaid Other | Admitting: Family Medicine

## 2014-09-05 ENCOUNTER — Encounter: Payer: Self-pay | Admitting: Clinical

## 2014-09-05 NOTE — Progress Notes (Signed)
CSW received pt's Patient-Centered Care Plan from [facility]  LTG: Pt will attend her neurology appt. For f/u of migranes on 09/13/14. PCP has made the referral.  STG: Pt will be linked to a [facility] that will deliver her medications & extend credit for copays within the next 2 weeks. LTG: Pt will begin to receive services per new pharmacy within the next month. Pt is deciding whether or not she still wants to switch pharmacies.

## 2014-09-13 ENCOUNTER — Telehealth: Payer: Self-pay | Admitting: *Deleted

## 2014-09-13 ENCOUNTER — Encounter: Payer: Self-pay | Admitting: Neurology

## 2014-09-13 ENCOUNTER — Ambulatory Visit (INDEPENDENT_AMBULATORY_CARE_PROVIDER_SITE_OTHER): Payer: Medicaid Other | Admitting: Neurology

## 2014-09-13 VITALS — BP 130/90 | HR 94 | Resp 18 | Ht 63.0 in | Wt 324.0 lb

## 2014-09-13 DIAGNOSIS — G43119 Migraine with aura, intractable, without status migrainosus: Secondary | ICD-10-CM

## 2014-09-13 DIAGNOSIS — H53123 Transient visual loss, bilateral: Secondary | ICD-10-CM

## 2014-09-13 NOTE — Telephone Encounter (Signed)
Patient in the process of moving cant schedule LP at this time will call back later(?)

## 2014-09-13 NOTE — Progress Notes (Signed)
NEUROLOGY CONSULTATION NOTE  [name] MRN: [id number] DOB: 22-Jun-1976  Referring provider: Dr. [name] Primary care provider: Dr. [name]  Reason for consult:  Headache  HISTORY OF PRESENT ILLNESS: [name] is a 38 year old right-handed woman with history of poorly controlled type II diabetes mellitus, hypertension, hyperlipidemia, OSA, asthma, GERD, morbid obesity, cellulitis, chronic pain who presents for headache and transient vision loss.  Records and images personally reviewed.  Onset:  In August, she began experiencing episodes of recurrent monocular vision loss in her right eye.  It was associated with mild right frontal headache.  There was no associated nausea.  She was reportedly evaluated by an ophthalmologist, who recommended neurological consultation.  She was admitted to [facility] on 07/12/14 for further evaluation.  CTA of the head and neck revealed no significant intracranial arterial stenosis or right carotid artery stenosis.  There were some calcified atheromatous changes near the left ICA ophthalmic origin, of no clinical significance.  MRI of the brain and orbits with and without contrast was unremarkable.  Sed Rate was 38.  WBC was 9.1, HGB 13.9, HCT 41, PLT 288, Na 137, K 4.1, BUN 8, and Cr 0.39.  Plasma glucose levels have been ranging from 270s to 460s.  HGB A1c from 05/13/14 was over 14.  During her hospitalization, symptoms resolved.  It was thought that she likely had migraine.  Within a day or two after hospital discharge, she began experiencing severe left sided throbbing headache.  She presented to the ED on 07/20/14 with  left sided vision loss, which resolved.  She returned the following day with episode of bilateral vision loss and sensation of left-sided heaviness.  She was treated with a migraine cocktail and symptoms resolved.  LP was discussed in the ED but patient refused.  On 07/26/14, she had another episode of bilateral vision loss, lasting 60 to 90  minutes and then left monocular vision loss the following day, lasting 45 minutes.  She developed recurrence of headache.  She denies numbness and tingling or pulsatile tinnitus.  She was taking ibuprofen and Fioricet frequently.    Location:  Bi-temporal/bi-frontal/top of head Quality:  Stabbing, squeezing Intensity:  6-7/10 (10/10 for severe ones) Aura:  Transient vision loss.  Initial either eye but later bilateral.  Last 45-60 minutes although one time lasted 3 hours.  Has not had one for 2 weeks. Prodrome:  no Associated symptoms:  Photophobia.  No nausea, vomiting, phonophobia, or osmophobia Duration:  3-4 days Frequency:  21-22 headache days per month (4-5 days are 10/10) Triggers/exacerbating factors:  Increased neck pain Relieving factors:  caffeine Activity:  Cannot function for severe ones  Past abortive therapy:  Fioricet (ineffective) Past preventative therapy:  topiramate 25mg  (took one dose but stopped because of sleepiness).  Current abortive therapy:  tramadol 50mg  (not helpful, takes 1-2 days/month), ibuprofen 600mg  (takes daily) Current preventative therapy:  none Other medications:  Norco (for chronic pain), albuterol, Symbicort, Lipitor, Zyrtec, Byetta, Novolog, Lantus, Lisinopril, metformin, nitroglycerin, Protonix.  Caffeine:  Mountain Dew daily for headache Alcohol:  no Smoker:  no Diet:  poor Exercise:  poor Depression/stress:  Depression controlled.  Notes stress Sleep hygiene:  Good with CPAP Family history of headache:  Maybe her sister had migraine She has no past medical history of migraine.  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  PAST MEDICAL HISTORY: Past Medical History  Diagnosis Date  . Morbid obesity   . Alveolar hypoventilation   .  Asthma   . GERD (gastroesophageal reflux disease)   . Rectal fissure   . Hypertension   . Costochondritis   . Cellulitis 08/2010-08/2011  . Depression   . Bipolar 2 disorder   .  Obstructive sleep apnea   . Chest pain     NO CAD by cath 5/62/13, nl LV systolic fxn  . Diabetes mellitus 2000    Type 2, Uncontrolled  . Arthritis   . HLD (hyperlipidemia)   . Anemia   . COPD (chronic obstructive pulmonary disease)     PAST SURGICAL HISTORY: Past Surgical History  Procedure Laterality Date  . Carpal tunnel release    . Bil knee surgery    . Mass excision N/A 06/29/2013    Procedure:  WIDE LOCAL EXCISION OF POSTERIOR NECK ABSCESS;  Surgeon: Ralene Ok, MD;  Location: [facility];  Service: General;  Laterality: N/A;  . Cardiac catheterization      MEDICATIONS: Current Outpatient Prescriptions on File Prior to Visit  Medication Sig Dispense Refill  . albuterol (PROVENTIL HFA;VENTOLIN HFA) 108 (90 BASE) MCG/ACT inhaler Inhale 1 puff into the lungs every 6 (six) hours as needed for wheezing or shortness of breath.  18 g  11  . atorvastatin (LIPITOR) 20 MG tablet Take 20 mg by mouth daily.      . budesonide-formoterol (SYMBICORT) 160-4.5 MCG/ACT inhaler Inhale 2 puffs into the lungs 2 (two) times daily.      . carvedilol (COREG) 12.5 MG tablet Take 12.5 mg by mouth 2 (two) times daily with a meal.      . cetirizine (ZYRTEC) 10 MG tablet Take 1 tablet (10 mg total) by mouth daily.  30 tablet  11  . exenatide (BYETTA 5 MCG PEN) 5 MCG/0.02ML SOPN injection Inject 0.02 mLs (5 mcg total) into the skin 2 (two) times daily with a meal.  1.2 mL  3  . fluticasone (FLONASE) 50 MCG/ACT nasal spray Place 2 sprays into both nostrils daily.  16 g  6  . HYDROcodone-acetaminophen (NORCO) 10-325 MG per tablet Take 1 tablet by mouth 3 (three) times daily as needed for moderate pain. Do not fill before 10/12/2014  90 tablet  0  . insulin aspart (NOVOLOG) 100 UNIT/ML injection Inject 60 Units into the skin 3 (three) times daily with meals.  10 mL  5  . Insulin Glargine (LANTUS) 100 UNIT/ML Solostar Pen Inject 150 Units into the skin 2 (two) times daily.      Marland Kitchen lisinopril (PRINIVIL,ZESTRIL)  10 MG tablet Take 1 tablet (10 mg total) by mouth daily.  30 tablet  5  . metFORMIN (GLUCOPHAGE) 500 MG tablet Take 500 mg by mouth 2 (two) times daily with a meal.      . pantoprazole (PROTONIX) 40 MG tablet Take 40 mg by mouth daily.      . traMADol (ULTRAM) 50 MG tablet Take 1 tablet (50 mg total) by mouth daily as needed for moderate pain.  15 tablet  0  . nitroGLYCERIN (NITROSTAT) 0.4 MG SL tablet Place 1 tablet (0.4 mg total) under the tongue every 5 (five) minutes as needed for chest pain.  30 tablet  0   No current facility-administered medications on file prior to visit.    ALLERGIES: Allergies  Allergen Reactions  . Kiwi Extract Anaphylaxis  . Nubain [Nalbuphine Hcl] Other (See Comments)    Nervous...makes her feel like something is crawling on her.    FAMILY HISTORY: Family History  Problem Relation Age of Onset  .  Diabetes Mother   . Hyperlipidemia Mother   . Depression Mother   . Heart attack Paternal Uncle   . Heart disease Paternal Grandmother   . Heart attack Paternal Grandmother   . Heart attack Paternal Grandfather   . Heart disease Paternal Grandfather     SOCIAL HISTORY: History   Social History  . Marital Status: Married    Spouse Name: N/A    Number of Children: N/A  . Years of Education: N/A   Occupational History  . Not on file.   Social History Main Topics  . Smoking status: Former Smoker -- 0.30 packs/day for .3 years    Types: Cigarettes    Quit date: 12/06/1993  . Smokeless tobacco: Never Used  . Alcohol Use: No  . Drug Use: No  . Sexual Activity: Not on file   Other Topics Concern  . Not on file   Social History Narrative   Lives in [place] with her fiance and 36 yr old dtr.    REVIEW OF SYSTEMS: Constitutional: No fevers, chills, or sweats, no generalized fatigue, change in appetite Eyes: No visual changes, double vision, eye pain Ear, nose and throat: No hearing loss, ear pain, nasal congestion, sore throat Cardiovascular: No  chest pain, palpitations Respiratory:  No shortness of breath at rest or with exertion, wheezes GastrointestinaI: No nausea, vomiting, diarrhea, abdominal pain, fecal incontinence Genitourinary:  No dysuria, urinary retention or frequency Musculoskeletal:  No neck pain, back pain Integumentary: No rash, pruritus, skin lesions Neurological: as above Psychiatric: No depression, insomnia, anxiety Endocrine: No palpitations, fatigue, diaphoresis, mood swings, change in appetite, change in weight, increased thirst Hematologic/Lymphatic:  No anemia, purpura, petechiae. Allergic/Immunologic: no itchy/runny eyes, nasal congestion, recent allergic reactions, rashes  PHYSICAL EXAM: Filed Vitals:   09/13/14 0952  BP: 130/90  Pulse: 94  Resp: 18   General: No acute distress Head:  Normocephalic/atraumatic Neck: supple, bilateral tenderness, full range of motion Back: No paraspinal tenderness Heart: regular rate and rhythm Lungs: Clear to auscultation bilaterally. Vascular: No carotid bruits. Neurological Exam: Mental status: alert and oriented to person, place, and time, recent and remote memory intact, fund of knowledge intact, attention and concentration intact, speech fluent and not dysarthric, language intact. Cranial nerves: CN I: not tested CN II: pupils equal, round and reactive to light, visual fields intact, fundi not visualized CN III, IV, VI:  full range of motion, no nystagmus, no ptosis CN V: facial sensation intact CN VII: upper and lower face symmetric CN VIII: hearing intact CN IX, X: gag intact, uvula midline CN XI: sternocleidomastoid and trapezius muscles intact CN XII: tongue midline Bulk & Tone: normal, no fasciculations. Motor: 3/5 left ankle dorsiflexion, otherwise 5/5 throughout Sensation: reduced pinprick sensation over dorsum of left foot Deep Tendon Reflexes: 2+ throughout, except absent in ankles.  Toes downgoing. Finger to nose testing: no dysmetria Gait:  normal station and stride.  Able to turn and walk in tandem. Romberg negative.  IMPRESSION: 1.  New onset headache with transient vision loss.  Differential diagnosis includes migraine with visual aura versus idiopathic intracranial hypertension. 2.  Medication-overuse headache  PLAN: Urged her to give topamax another try and see if she adapts to it, as it is likely one of the best medications for her.  25mg  at bedtime.  Call in 4 weeks to increase dose if needed. Stop ibuprofen and tramadol.  Must limit use of pain relievers to no more than 2 days out of the week to prevent rebound headache.  Use naproxen instead as it is long-acting. Ideally, patient should limit use of Norco, as this will definitely compromise treatment of chronic headache. Stop caffeine Must have LP to rule out elevated intracranial pressure.  Would test for opening pressure, cell count with differential, protein, glucose Must optimize glucose control and weight loss  Thank you for allowing me to take part in the care of this patient.  Metta Clines, DO  CC: [name], MD

## 2014-09-13 NOTE — Patient Instructions (Addendum)
You probably are suffering from migraine, but we must rule out increased intracranial pressure in the head.  This is important, because long term effect of this condition is permanent vision loss. 1.  We will check spinal tap for opening pressure, cell count with diff, protein, glucose, gram stain with culture. [facility] will call you with an appt. 2.  Start the topamax 25mg  at bedtime.  It really is the best medication for this and I want you to give it a chance.  Call in 4 weeks with update and we can adjust dose if needed. 3.  Start headache diary to keep track days you have headache 4.  Stop daily ibuprofen.  Stop tramadol.  You must limit use of all pain relievers to no more than 2 days out of the week.  This also goes for caffeine and hydrocodone.  Consider naproxen 500mg  twice daily since it is long acting.   5.  Stop caffeine and soda. 6.  Exercise and weight loss important. 7.  Follow up in 2 months.

## 2014-09-16 NOTE — Telephone Encounter (Signed)
FYI

## 2014-09-20 ENCOUNTER — Other Ambulatory Visit: Payer: Self-pay | Admitting: Family Medicine

## 2014-09-25 ENCOUNTER — Telehealth: Payer: Self-pay | Admitting: Endocrinology

## 2014-09-25 ENCOUNTER — Ambulatory Visit: Payer: Medicaid Other | Admitting: Endocrinology

## 2014-09-25 NOTE — Telephone Encounter (Signed)
No follow up necessary.  

## 2014-09-25 NOTE — Telephone Encounter (Signed)
Patient no showed today's appt. Please advise on how to follow up. °A. No follow up necessary. °B. Follow up urgent. Contact patient immediately. °C. Follow up necessary. Contact patient and schedule visit in ___ days. °D. Follow up advised. Contact patient and schedule visit in ____weeks. ° °

## 2014-10-07 ENCOUNTER — Other Ambulatory Visit: Payer: Self-pay | Admitting: Endocrinology

## 2014-10-07 ENCOUNTER — Other Ambulatory Visit: Payer: Self-pay | Admitting: Family Medicine

## 2014-10-07 ENCOUNTER — Other Ambulatory Visit: Payer: Self-pay

## 2014-10-07 MED ORDER — ACCU-CHEK FASTCLIX LANCETS MISC: Status: DC

## 2014-10-07 MED ORDER — INSULIN GLARGINE 100 UNIT/ML SOLOSTAR PEN: 150.0000 [IU] | PEN_INJECTOR | Freq: Two times a day (BID) | SUBCUTANEOUS | Status: DC

## 2014-10-08 ENCOUNTER — Telehealth: Payer: Self-pay

## 2014-10-08 MED ORDER — INSULIN ASPART 100 UNIT/ML ~~LOC~~ SOLN: 60.0000 [IU] | Freq: Three times a day (TID) | SUBCUTANEOUS | Status: DC

## 2014-10-08 MED ORDER — METFORMIN HCL 500 MG PO TABS: 500.0000 mg | ORAL_TABLET | Freq: Two times a day (BID) | ORAL | Status: DC

## 2014-10-08 NOTE — Telephone Encounter (Signed)
Rx sent to pharmacy   

## 2014-10-08 NOTE — Telephone Encounter (Signed)
Please refill x 1, as pt has appt sched in 2 days

## 2014-10-08 NOTE — Telephone Encounter (Signed)
Received a refill request for Novolog and Metformin. Both of these medications are listed under a different provider.  Please advised if ok to refill.  Thanks!

## 2014-10-10 ENCOUNTER — Ambulatory Visit: Payer: Medicaid Other | Admitting: Endocrinology

## 2014-10-14 ENCOUNTER — Encounter: Payer: Self-pay | Admitting: Family Medicine

## 2014-10-14 ENCOUNTER — Ambulatory Visit (INDEPENDENT_AMBULATORY_CARE_PROVIDER_SITE_OTHER): Payer: Medicaid Other | Admitting: Family Medicine

## 2014-10-14 VITALS — BP 124/82 | HR 105 | Temp 98.1°F | Ht 63.0 in | Wt 320.0 lb

## 2014-10-14 DIAGNOSIS — R51 Headache: Secondary | ICD-10-CM

## 2014-10-14 DIAGNOSIS — Z7189 Other specified counseling: Secondary | ICD-10-CM

## 2014-10-14 DIAGNOSIS — G2581 Restless legs syndrome: Secondary | ICD-10-CM

## 2014-10-14 DIAGNOSIS — R519 Headache, unspecified: Secondary | ICD-10-CM

## 2014-10-14 DIAGNOSIS — L089 Local infection of the skin and subcutaneous tissue, unspecified: Secondary | ICD-10-CM

## 2014-10-14 DIAGNOSIS — I10 Essential (primary) hypertension: Secondary | ICD-10-CM

## 2014-10-14 DIAGNOSIS — G8929 Other chronic pain: Secondary | ICD-10-CM

## 2014-10-14 MED ORDER — HYDROCODONE-ACETAMINOPHEN 10-325 MG PO TABS: 1.0000 | ORAL_TABLET | Freq: Three times a day (TID) | ORAL | Status: DC | PRN

## 2014-10-14 MED ORDER — TOPIRAMATE 25 MG PO TABS: ORAL_TABLET | ORAL | Status: DC

## 2014-10-14 MED ORDER — CLOTRIMAZOLE 1 % EX CREA: 1.0000 "application " | TOPICAL_CREAM | Freq: Two times a day (BID) | CUTANEOUS | Status: DC

## 2014-10-14 MED ORDER — GABAPENTIN 100 MG PO CAPS: 100.0000 mg | ORAL_CAPSULE | Freq: Every day | ORAL | Status: DC

## 2014-10-14 NOTE — Progress Notes (Signed)
Patient ID: [name], female   DOB: 1976/02/20, 38 y.o.   MRN: [id number]  HPI:  Chronic pain: needs refill on pain meds. Walking more now, has to walk daughter to school every day, twice a day. Pain medicine is helping. Takes norco 10-325mg  three times daily. Also does tramadol 50mg .  Headaches: taking 25mg  topamax at night. Planning to f/u with neurologist. Did not get LP (doesn't want it). Headaches are overall much much better and more tolerable. No more issues with vision giving out.  Topical fungal medicine: requests medicine for yeast under panus. Does not currently have the problem but wants something for when it returns. Recently used her grandmothers triamcinolone ointment which worked.  HTN: currrently taking lisinopril 10mg  daily, carvedilol 12.5mg  BID, No CP or SOB  Restless legs: thinks she has restless leg syndrome. Legs move a lot at night when goes to lay down.  ROS: See HPI.  [place]: morbid obesity, poorly controlled T2DM, HTN, HLD  PHYSICAL EXAM: BP 124/82 mmHg  Pulse 105  Temp(Src) 98.1 F (36.7 C) (Oral)  Ht 5\' 3"  (1.6 m)  Wt 320 lb (145.151 kg)  BMI 56.70 kg/m2  LMP 10/06/2014 Gen: NAD HEENT: NCAT Heart: RRR Lungs: CTAB NWOB Neuro: grossly nonfocal speech normal Ext: atraumatic, No appreciable lower extremity edema bilaterally   ASSESSMENT/PLAN:  Skin infection For intertriginous candidiasis will rx clotrimazole cream for pt to use if this issue returns. Advised against triamcinolone since this would make a fungal infection worse.  Restless leg syndrome Trial of gabapentin 100mg  QHS 1-2 hrs before bedtime.  Encounter for chronic pain management Refilled norco x 2 months (has one month already on file). F/u in 3 mos for next refill.  Essential hypertension Well controlled. Continue current regimen.   Headache Much improved after starting topamax. Encouraged pt to f/u with neurology and consider LP. Continue topamax for now.   FOLLOW UP: F/u in  3 months for chronic pain  [name], [place]

## 2014-10-14 NOTE — Patient Instructions (Signed)
It was great to see you again today!  For BP: continue current meds.  For pain: refilled 2 months of pain meds. F/u in 3 months.  Headaches: continue topamax 25mg  at night. Follow up with neurology.  Restless legs: start gabapentin 100mg  1-2 hours before bedtime.  Be well, Dr. [name]

## 2014-10-15 ENCOUNTER — Ambulatory Visit: Payer: Medicaid Other | Admitting: Endocrinology

## 2014-10-16 ENCOUNTER — Telehealth: Payer: Self-pay

## 2014-10-16 NOTE — Telephone Encounter (Signed)
Pharmacy advised  

## 2014-10-16 NOTE — Telephone Encounter (Signed)
Yes, that is correct 

## 2014-10-16 NOTE — Telephone Encounter (Signed)
Received a phone call from [facility]. Wanted to verify Lantus dosage. Rx reads 150 units 2 times per day. Is this correct? Thanks!

## 2014-10-17 DIAGNOSIS — G2581 Restless legs syndrome: Secondary | ICD-10-CM | POA: Insufficient documentation

## 2014-10-17 NOTE — Assessment & Plan Note (Signed)
Much improved after starting topamax. Encouraged pt to f/u with neurology and consider LP. Continue topamax for now.

## 2014-10-17 NOTE — Assessment & Plan Note (Signed)
For intertriginous candidiasis will rx clotrimazole cream for pt to use if this issue returns. Advised against triamcinolone since this would make a fungal infection worse.

## 2014-10-17 NOTE — Assessment & Plan Note (Signed)
Well-controlled.  Continue current regimen. 

## 2014-10-17 NOTE — Assessment & Plan Note (Signed)
Refilled norco x 2 months (has one month already on file). F/u in 3 mos for next refill.

## 2014-10-17 NOTE — Assessment & Plan Note (Signed)
Trial of gabapentin 100mg  QHS 1-2 hrs before bedtime.

## 2014-10-22 ENCOUNTER — Ambulatory Visit: Payer: Medicaid Other | Admitting: Endocrinology

## 2014-10-23 ENCOUNTER — Emergency Department (HOSPITAL_COMMUNITY): Payer: Medicaid Other

## 2014-10-23 ENCOUNTER — Encounter (HOSPITAL_COMMUNITY): Payer: Self-pay

## 2014-10-23 ENCOUNTER — Emergency Department (HOSPITAL_COMMUNITY)
Admission: EM | Admit: 2014-10-23 | Discharge: 2014-10-24 | Disposition: A | Discharge status: Home / Self Care | Payer: Medicaid Other | Attending: Emergency Medicine | Admitting: Emergency Medicine

## 2014-10-23 DIAGNOSIS — I10 Essential (primary) hypertension: Secondary | ICD-10-CM | POA: Diagnosis not present

## 2014-10-23 DIAGNOSIS — Z872 Personal history of diseases of the skin and subcutaneous tissue: Secondary | ICD-10-CM | POA: Diagnosis not present

## 2014-10-23 DIAGNOSIS — Z87891 Personal history of nicotine dependence: Secondary | ICD-10-CM | POA: Insufficient documentation

## 2014-10-23 DIAGNOSIS — M1990 Unspecified osteoarthritis, unspecified site: Secondary | ICD-10-CM | POA: Insufficient documentation

## 2014-10-23 DIAGNOSIS — E785 Hyperlipidemia, unspecified: Secondary | ICD-10-CM | POA: Insufficient documentation

## 2014-10-23 DIAGNOSIS — Z7951 Long term (current) use of inhaled steroids: Secondary | ICD-10-CM | POA: Insufficient documentation

## 2014-10-23 DIAGNOSIS — K219 Gastro-esophageal reflux disease without esophagitis: Secondary | ICD-10-CM | POA: Insufficient documentation

## 2014-10-23 DIAGNOSIS — R079 Chest pain, unspecified: Secondary | ICD-10-CM | POA: Diagnosis present

## 2014-10-23 DIAGNOSIS — E119 Type 2 diabetes mellitus without complications: Secondary | ICD-10-CM | POA: Insufficient documentation

## 2014-10-23 DIAGNOSIS — F3181 Bipolar II disorder: Secondary | ICD-10-CM | POA: Insufficient documentation

## 2014-10-23 DIAGNOSIS — J449 Chronic obstructive pulmonary disease, unspecified: Secondary | ICD-10-CM | POA: Insufficient documentation

## 2014-10-23 DIAGNOSIS — Z8669 Personal history of other diseases of the nervous system and sense organs: Secondary | ICD-10-CM | POA: Insufficient documentation

## 2014-10-23 DIAGNOSIS — J159 Unspecified bacterial pneumonia: Secondary | ICD-10-CM | POA: Diagnosis not present

## 2014-10-23 DIAGNOSIS — Z862 Personal history of diseases of the blood and blood-forming organs and certain disorders involving the immune mechanism: Secondary | ICD-10-CM | POA: Insufficient documentation

## 2014-10-23 DIAGNOSIS — Z79899 Other long term (current) drug therapy: Secondary | ICD-10-CM | POA: Insufficient documentation

## 2014-10-23 DIAGNOSIS — J189 Pneumonia, unspecified organism: Secondary | ICD-10-CM

## 2014-10-23 DIAGNOSIS — Z794 Long term (current) use of insulin: Secondary | ICD-10-CM | POA: Insufficient documentation

## 2014-10-23 DIAGNOSIS — R110 Nausea: Secondary | ICD-10-CM | POA: Diagnosis not present

## 2014-10-23 LAB — CBC
HCT: 40.2 % (ref 36.0–46.0)
Hemoglobin: 13.7 g/dL (ref 12.0–15.0)
MCH: 29.8 pg (ref 26.0–34.0)
MCHC: 34.1 g/dL (ref 30.0–36.0)
MCV: 87.4 fL (ref 78.0–100.0)
PLATELETS: 277 10*3/uL (ref 150–400)
RBC: 4.6 MIL/uL (ref 3.87–5.11)
RDW: 12.7 % (ref 11.5–15.5)
WBC: 10.3 10*3/uL (ref 4.0–10.5)

## 2014-10-23 LAB — I-STAT TROPONIN, ED: Troponin i, poc: 0 ng/mL (ref 0.00–0.08)

## 2014-10-23 MED ORDER — ASPIRIN 81 MG PO CHEW: 324.0000 mg | CHEWABLE_TABLET | Freq: Once | ORAL | Status: DC

## 2014-10-23 MED ORDER — MORPHINE SULFATE 4 MG/ML IJ SOLN
4.0000 mg | Freq: Once | INTRAMUSCULAR | Status: AC
  Administered 2014-10-23: 4 mg via INTRAVENOUS
  Filled 2014-10-23: qty 1

## 2014-10-23 MED ORDER — SODIUM CHLORIDE 0.9 % IV SOLN
Freq: Once | INTRAVENOUS | Status: AC
Start: 2014-10-23 — End: 2014-10-24
  Administered 2014-10-23: 23:00:00 via INTRAVENOUS

## 2014-10-23 MED ORDER — ONDANSETRON HCL 4 MG/2ML IJ SOLN: 4.0000 mg | Freq: Once | INTRAMUSCULAR | Status: DC

## 2014-10-23 NOTE — ED Notes (Signed)
Pt brought in by EMS for left sided chest pain that radiates down left arm that began at aprox. 2200 this evening.  Pt reports shob, nausea and dizziness.  The pain began while pt was at rest.  Pain is described as sharp and rated 8/10.

## 2014-10-23 NOTE — ED Provider Notes (Signed)
CSN: [id number]     Arrival date & time 10/23/14  2255 History   First MD Initiated Contact with Patient 10/23/14 2300     Chief Complaint  Patient presents with  . Chest Pain     (Consider location/radiation/quality/duration/timing/severity/associated sxs/prior Treatment) Patient is a 38 y.o. female presenting with chest pain. The history is provided by the patient.  Chest Pain Pain location:  L chest Pain quality: pressure   Pain radiates to:  L shoulder and L arm Pain radiates to the back: no   Pain severity:  Moderate Onset quality:  Sudden Duration:  3 hours Timing:  Constant Progression:  Unchanged Chronicity:  Recurrent Context: at rest   Context: not lifting and no movement   Relieved by:  Nothing Worsened by:  Nothing tried Ineffective treatments:  Nitroglycerin and rest Associated symptoms: nausea   Associated symptoms: no cough, no diaphoresis, no dizziness, no fever, no lower extremity edema, no shortness of breath, not vomiting and no weakness   Risk factors: diabetes mellitus, hypertension and obesity     Past Medical History  Diagnosis Date  . Morbid obesity   . Alveolar hypoventilation   . Asthma   . GERD (gastroesophageal reflux disease)   . Rectal fissure   . Hypertension   . Costochondritis   . Cellulitis 08/2010-08/2011  . Depression   . Bipolar 2 disorder   . Obstructive sleep apnea   . Chest pain     NO CAD by cath 8/58/85, nl LV systolic fxn  . Diabetes mellitus 2000    Type 2, Uncontrolled  . Arthritis   . HLD (hyperlipidemia)   . Anemia   . COPD (chronic obstructive pulmonary disease)    Past Surgical History  Procedure Laterality Date  . Carpal tunnel release    . Bil knee surgery    . Mass excision N/A 06/29/2013    Procedure:  WIDE LOCAL EXCISION OF POSTERIOR NECK ABSCESS;  Surgeon: Ralene Ok, MD;  Location: [facility];  Service: General;  Laterality: N/A;  . Cardiac catheterization     Family History  Problem Relation Age of  Onset  . Diabetes Mother   . Hyperlipidemia Mother   . Depression Mother   . Heart attack Paternal Uncle   . Heart disease Paternal Grandmother   . Heart attack Paternal Grandmother   . Heart attack Paternal Grandfather   . Heart disease Paternal Grandfather    History  Substance Use Topics  . Smoking status: Former Smoker -- 0.30 packs/day for .3 years    Types: Cigarettes    Quit date: 12/06/1993  . Smokeless tobacco: Never Used  . Alcohol Use: No   OB History    No data available     Review of Systems  Constitutional: Negative for fever and diaphoresis.  Respiratory: Negative for cough and shortness of breath.   Cardiovascular: Positive for chest pain. Negative for leg swelling.  Gastrointestinal: Positive for nausea. Negative for vomiting.  Genitourinary: Negative for dysuria.  Skin: Negative for rash.  Neurological: Negative for dizziness and weakness.  All other systems reviewed and are negative.     Allergies  Kiwi extract and Nubain  Home Medications   Prior to Admission medications   Medication Sig Start Date End Date Taking? Authorizing Provider  ACCU-CHEK FASTCLIX LANCETS MISC Use to check blood sugar 2 times per day. 10/07/14   [name], MD  albuterol (PROVENTIL HFA;VENTOLIN HFA) 108 (90 BASE) MCG/ACT inhaler Inhale 1 puff into the lungs every  6 (six) hours as needed for wheezing or shortness of breath. 06/11/14   [name], MD  atorvastatin (LIPITOR) 20 MG tablet Take 20 mg by mouth daily.    Historical Provider, MD  azithromycin (ZITHROMAX) 250 MG tablet Take 1 tablet (250 mg total) by mouth daily. 10/24/14   Garald Balding, NP  BYETTA 5 MCG PEN 5 MCG/0.02ML SOPN injection INJECT 0.02 MLS (5 MCG TOTAL) SUBCUTANEOUSLY TWICE DAILY WITH A MEAL 10/07/14   [name], MD  carvedilol (COREG) 12.5 MG tablet Take 12.5 mg by mouth 2 (two) times daily with a meal.    Historical Provider, MD  cetirizine (ZYRTEC) 10 MG tablet Take 1 tablet (10 mg total) by  mouth daily. 06/11/14   [name], MD  clotrimazole (LOTRIMIN) 1 % cream Apply 1 application topically 2 (two) times daily. As needed for skin yeast 10/14/14   [name], MD  fluticasone [facility]) 50 MCG/ACT nasal spray Place 2 sprays into both nostrils daily. 05/21/14   Melony Overly, MD  gabapentin (NEURONTIN) 100 MG capsule Take 1 capsule (100 mg total) by mouth at bedtime. 10/14/14   [name], MD  HYDROcodone-acetaminophen (NORCO) 10-325 MG per tablet Take 1 tablet by mouth 3 (three) times daily as needed for moderate pain. Do not fill before 12/12/2014 10/14/14   [name], MD  insulin aspart (NOVOLOG) 100 UNIT/ML injection Inject 60 Units into the skin 3 (three) times daily with meals. 10/08/14   [name], MD  Insulin Glargine (LANTUS) 100 UNIT/ML Solostar Pen Inject 150 Units into the skin 2 (two) times daily. 10/07/14   [name], MD  lisinopril (PRINIVIL,ZESTRIL) 10 MG tablet Take 1 tablet (10 mg total) by mouth daily. 03/12/14   [name], MD  metFORMIN (GLUCOPHAGE) 500 MG tablet Take 1 tablet (500 mg total) by mouth 2 (two) times daily with a meal. 10/08/14   [name], MD  nitroGLYCERIN (NITROSTAT) 0.4 MG SL tablet Place 1 tablet (0.4 mg total) under the tongue every 5 (five) minutes as needed for chest pain. 07/09/14   Coral Spikes, DO  pantoprazole (PROTONIX) 40 MG tablet Take 40 mg by mouth daily.    Historical Provider, MD  SYMBICORT 160-4.5 MCG/ACT inhaler INHALE 2 PUFFS INTO LUNGS TWICE DAILY 10/18/14   [name], MD  topiramate (TOPAMAX) 25 MG tablet TAKE 1 TABLET BY MOUTH EVERY NIGHT AT BEDTIME 10/14/14   [name], MD  traMADol (ULTRAM) 50 MG tablet TAKE 1 TABLET BY MOUTH EVERY DAY AS NEEDED FOR MODERATE PAIN 10/18/14   [name], MD   BP 118/68 mmHg  Pulse 108  Temp(Src) 98 F (36.7 C) (Oral)  Resp 17  Ht 5\' 2"  (1.575 m)  Wt 320 lb (145.151 kg)  BMI 58.51 kg/m2  SpO2 95%  LMP 10/06/2014 Physical  Exam  Constitutional: She is oriented to person, place, and time. She appears well-developed and well-nourished.  Morbid obesity  HENT:  Head: Normocephalic.  Eyes: Pupils are equal, round, and reactive to light.  Neck: Normal range of motion.  Cardiovascular: Normal rate and regular rhythm.   Pulmonary/Chest: Effort normal and breath sounds normal. She has no rales. She exhibits no tenderness.  Musculoskeletal: Normal range of motion. She exhibits no edema.  Neurological: She is alert and oriented to person, place, and time.  Skin: Skin is warm.  Nursing note and vitals reviewed.   ED Course  Procedures (including critical care time) Labs Review  Labs Reviewed  BASIC METABOLIC PANEL - Abnormal; Notable for the following:    Sodium 132 (*)    Chloride 94 (*)    Glucose, Bld 377 (*)    All other components within normal limits  CBC  I-STAT TROPOININ, ED  Randolm Idol, ED    Imaging Review Dg Chest Port 1 View  10/23/2014   CLINICAL DATA:  Left upper chest pain  EXAM: PORTABLE CHEST - 1 VIEW  COMPARISON:  03/13/2014  FINDINGS: Cardiac shadow is within normal limits. The lungs are well aerated bilaterally with some mild increased density in the left lung base consistent with atelectasis and/or early infiltrate. The bony structures are within normal limits.  IMPRESSION: Increased density in the left base likely representing atelectasis and/or early infiltrate.   Electronically Signed   By: Inez Catalina M.D.   On: 10/23/2014 23:36     EKG Interpretation   Date/Time:  Wednesday October 23 2014 23:27:25 EST Ventricular Rate:  103 PR Interval:  159 QRS Duration: 95 QT Interval:  348 QTC Calculation: 455 R Axis:   88 Text Interpretation:  Sinus or ectopic atrial tachycardia Low voltage,  precordial leads Probable lateral infarct, age indeterminate Confirmed by  OTTER  MD, OLGA ([id number]) on 10/23/2014 11:45:53 PM     Patient has had 2 sets of negative cardiac markers.  I do  not feel that her chest discomfort is cardiac related, but more from the developing pneumonia that she's has seen on x-ray.  She has been given Rocephin and azithromycin in the emergency department and discharged home with a prescription for azithromycin.  She has several prescriptions for Vicodin and Ultram.  He think this is adequate to treat her chest discomfort.  She can make an appointment with her primary care physician for  Follow-up  MDM   Final diagnoses:  CAP (community acquired pneumonia)        Garald Balding, NP 10/24/14 Corinne, MD 10/24/14 [phone]

## 2014-10-24 LAB — BASIC METABOLIC PANEL
ANION GAP: 15 (ref 5–15)
BUN: 9 mg/dL (ref 6–23)
CO2: 23 mEq/L (ref 19–32)
Calcium: 8.9 mg/dL (ref 8.4–10.5)
Chloride: 94 mEq/L — ABNORMAL LOW (ref 96–112)
Creatinine, Ser: 0.54 mg/dL (ref 0.50–1.10)
GFR calc Af Amer: >90 mL/min (ref >90)
Glucose, Bld: 377 mg/dL — ABNORMAL HIGH (ref 70–99)
POTASSIUM: 4.2 meq/L (ref 3.7–5.3)
SODIUM: 132 meq/L — AB (ref 137–147)

## 2014-10-24 LAB — I-STAT TROPONIN, ED: TROPONIN I, POC: 0 ng/mL (ref 0.00–0.08)

## 2014-10-24 MED ORDER — ONDANSETRON HCL 4 MG/2ML IJ SOLN
4.0000 mg | Freq: Once | INTRAMUSCULAR | Status: AC
  Administered 2014-10-24: 4 mg via INTRAVENOUS
  Filled 2014-10-24: qty 2

## 2014-10-24 MED ORDER — DEXTROSE 5 % IV SOLN
1.0000 g | INTRAVENOUS | Status: DC
  Administered 2014-10-24: 1 g via INTRAVENOUS
  Filled 2014-10-24: qty 10

## 2014-10-24 MED ORDER — AZITHROMYCIN 250 MG PO TABS
500.0000 mg | ORAL_TABLET | Freq: Once | ORAL | Status: AC
  Administered 2014-10-24: 500 mg via ORAL
  Filled 2014-10-24: qty 2

## 2014-10-24 MED ORDER — AZITHROMYCIN 250 MG PO TABS: 250.0000 mg | ORAL_TABLET | Freq: Every day | ORAL | Status: DC

## 2014-10-24 NOTE — ED Notes (Signed)
PT ambulated to bathroom. Steady gait. Pt denies light headedness or dizziness.

## 2014-10-24 NOTE — ED Notes (Signed)
Pt sts "I'm feeling worse than when I came in.  I'm really nauseous and feel like I'm running a fever."  Pt temperature checked and it was 98.0 oral.  PA to be made aware.

## 2014-10-24 NOTE — Discharge Instructions (Signed)
Tonight your evaluated for acute onset of chest pain.  2 sets of cardiac markers are negative, but her x-ray does show that you have the beginnings of a pneumonia.  You got 2 doses of antibiotics in the emergency department and a prescription for antibiotic that you should take one tablet daily for the next 4 days.  Please make an appointment with your primary care physician for follow-up.  You have several prescriptions for pain medicine with refills.  This should be adequate to treat your  discomfort

## 2014-10-25 ENCOUNTER — Telehealth: Payer: Self-pay | Admitting: Endocrinology

## 2014-10-25 NOTE — Telephone Encounter (Signed)
Follow up advised. Contact patient and schedule visit in 2-3 weeks.

## 2014-10-25 NOTE — Telephone Encounter (Signed)
Patient no showed today's appt. Please advise on how to follow up. °A. No follow up necessary. °B. Follow up urgent. Contact patient immediately. °C. Follow up necessary. Contact patient and schedule visit in ___ days. °D. Follow up advised. Contact patient and schedule visit in ____weeks. ° °

## 2014-10-28 NOTE — Telephone Encounter (Signed)
Letter sent to pt

## 2014-11-07 ENCOUNTER — Other Ambulatory Visit: Payer: Self-pay | Admitting: Family Medicine

## 2014-11-07 ENCOUNTER — Other Ambulatory Visit: Payer: Self-pay | Admitting: Endocrinology

## 2014-11-08 NOTE — Telephone Encounter (Signed)
Please advise if ok to refill. Pt was last seen on 05/13/2014.

## 2014-11-08 NOTE — Telephone Encounter (Signed)
Please refill x 1 Ov is due  

## 2014-11-08 NOTE — Telephone Encounter (Signed)
Covering for Dr [name]. Refusing tramadol at this time as it was filled mid-November 15 tab with 2 refills (45 tabs) to be used daily PRN, which should last until end of Dec. Please inform pt original rx had 2 refills in case she did not know this.  Refilled clotrimazole cream.  Hilton Sinclair, MD

## 2014-11-08 NOTE — Telephone Encounter (Signed)
Rx sent 

## 2014-11-11 ENCOUNTER — Ambulatory Visit (INDEPENDENT_AMBULATORY_CARE_PROVIDER_SITE_OTHER): Payer: Medicaid Other | Admitting: Family Medicine

## 2014-11-11 VITALS — BP 132/86 | HR 99 | Temp 98.4°F | Resp 18 | Wt 324.0 lb

## 2014-11-11 DIAGNOSIS — L241 Irritant contact dermatitis due to oils and greases: Secondary | ICD-10-CM

## 2014-11-11 DIAGNOSIS — L290 Pruritus ani: Secondary | ICD-10-CM

## 2014-11-11 DIAGNOSIS — G4733 Obstructive sleep apnea (adult) (pediatric): Secondary | ICD-10-CM

## 2014-11-11 DIAGNOSIS — L089 Local infection of the skin and subcutaneous tissue, unspecified: Secondary | ICD-10-CM

## 2014-11-11 MED ORDER — DICLOFENAC SODIUM 1 % TD GEL: 2.0000 g | Freq: Two times a day (BID) | TRANSDERMAL | Status: DC

## 2014-11-11 MED ORDER — HYDROCORTISONE 2.5 % RE CREA: 1.0000 "application " | TOPICAL_CREAM | Freq: Two times a day (BID) | RECTAL | Status: DC

## 2014-11-11 MED ORDER — TRAMADOL HCL 50 MG PO TABS: ORAL_TABLET | ORAL | Status: DC

## 2014-11-11 NOTE — Patient Instructions (Addendum)
Sent in refill on tramadol  I'll refill your cpap stuff - [facility] in a few days to get this set up.  Sent in hydrocortisone cream for rectal itching - return if not getting better  For skin pain - sent in voltaren gel. Use this twice daily as needed but NOT for more than one week. Medicaid should cover this. If not getting better come back sooner to see [place].  Follow up with me in 2 months for your regular medical problems.  Happy Holidays!  Dr. [name]

## 2014-11-12 ENCOUNTER — Other Ambulatory Visit: Payer: Self-pay | Admitting: Endocrinology

## 2014-11-12 ENCOUNTER — Other Ambulatory Visit: Payer: Self-pay | Admitting: Family Medicine

## 2014-11-13 ENCOUNTER — Ambulatory Visit: Payer: Medicaid Other | Admitting: Neurology

## 2014-11-14 ENCOUNTER — Encounter (HOSPITAL_COMMUNITY): Payer: Self-pay | Admitting: Cardiovascular Disease

## 2014-11-14 NOTE — Telephone Encounter (Signed)
Rx sent 

## 2014-11-15 ENCOUNTER — Ambulatory Visit (INDEPENDENT_AMBULATORY_CARE_PROVIDER_SITE_OTHER): Payer: Medicaid Other | Admitting: Endocrinology

## 2014-11-15 ENCOUNTER — Encounter: Payer: Self-pay | Admitting: Endocrinology

## 2014-11-15 VITALS — BP 128/90 | HR 100 | Temp 98.4°F | Ht 62.0 in | Wt 321.0 lb

## 2014-11-15 DIAGNOSIS — E1165 Type 2 diabetes mellitus with hyperglycemia: Secondary | ICD-10-CM

## 2014-11-15 DIAGNOSIS — IMO0002 Reserved for concepts with insufficient information to code with codable children: Secondary | ICD-10-CM

## 2014-11-15 LAB — LIPID PANEL
Cholesterol: 250 mg/dL — ABNORMAL HIGH (ref 0–200)
HDL: 36.9 mg/dL — ABNORMAL LOW (ref >39.00)
NONHDL: 213.1
Total CHOL/HDL Ratio: 7
Triglycerides: 362 mg/dL — ABNORMAL HIGH (ref 0.0–149.0)
VLDL: 72.4 mg/dL — ABNORMAL HIGH (ref 0.0–40.0)

## 2014-11-15 LAB — BASIC METABOLIC PANEL
BUN: 10 mg/dL (ref 6–23)
CALCIUM: 8.6 mg/dL (ref 8.4–10.5)
CO2: 22 meq/L (ref 19–32)
CREATININE: 0.5 mg/dL (ref 0.4–1.2)
Chloride: 100 mEq/L (ref 96–112)
GFR: 144.4 mL/min (ref >60.00)
GLUCOSE: 389 mg/dL — AB (ref 70–99)
Potassium: 3.3 mEq/L — ABNORMAL LOW (ref 3.5–5.1)
Sodium: 132 mEq/L — ABNORMAL LOW (ref 135–145)

## 2014-11-15 LAB — HEMOGLOBIN A1C: Hgb A1c MFr Bld: 13.8 % — ABNORMAL HIGH (ref 4.6–6.5)

## 2014-11-15 LAB — TSH: TSH: 1.62 u[IU]/mL (ref 0.35–4.50)

## 2014-11-15 NOTE — Patient Instructions (Addendum)
check your blood sugar twice a day.  vary the time of day when you check, between before the 3 meals, and at bedtime.  also check if you have symptoms of your blood sugar being too high or too low.  please keep a record of the readings and bring it to your next appointment here.  You can write it on any piece of paper.  please call us sooner if your blood sugar goes below 70, or if you have a lot of readings over 200. In view of your medical condition, you should avoid pregnancy until we have decided it is safe.     Please come back for a follow-up appointment in 6 weeks.   blood and urine tests are being requested for you today.  We'll let you know about the results.

## 2014-11-15 NOTE — Progress Notes (Signed)
Subjective:    Patient ID: [name], female    DOB: 1976-01-11, 38 y.o.   MRN: [id number]  HPI  Pt returns for f/u of diabetes mellitus: DM type: Insulin-requiring type 2 Dx'ed: 7824 Complications: polyneuropathy. Therapy: insulin since 2007 pregnancy DKA: never Severe hypoglycemia: never Pancreatitis: never Other: she takes multiple daily injections.   Interval history: no cbg record, but states cbg's vary from 125-200.  It is in general higher as the day goes on.  She says she never misses the insulin.  pt states she feels well in general.   Past Medical History  Diagnosis Date  . Morbid obesity   . Alveolar hypoventilation   . Asthma   . GERD (gastroesophageal reflux disease)   . Rectal fissure   . Hypertension   . Costochondritis   . Cellulitis 08/2010-08/2011  . Depression   . Bipolar 2 disorder   . Obstructive sleep apnea   . Chest pain     NO CAD by cath 2/35/36, nl LV systolic fxn  . Diabetes mellitus 2000    Type 2, Uncontrolled  . Arthritis   . HLD (hyperlipidemia)   . Anemia   . COPD (chronic obstructive pulmonary disease)     Past Surgical History  Procedure Laterality Date  . Carpal tunnel release    . Bil knee surgery    . Mass excision N/A 06/29/2013    Procedure:  WIDE LOCAL EXCISION OF POSTERIOR NECK ABSCESS;  Surgeon: Ralene Ok, MD;  Location: [place];  Service: General;  Laterality: N/A;  . Cardiac catheterization    . Left heart catheterization with coronary angiogram N/A 07/27/2012    Procedure: LEFT HEART CATHETERIZATION WITH CORONARY ANGIOGRAM;  Surgeon: Sherren Mocha, MD;  Location: [facility] CATH LAB;  Service: Cardiovascular;  Laterality: N/A;    History   Social History  . Marital Status: Married    Spouse Name: N/A    Number of Children: N/A  . Years of Education: N/A   Occupational History  . Not on file.   Social History Main Topics  . Smoking status: Former Smoker -- 0.30 packs/day for .3 years    Types: Cigarettes    Quit  date: 12/06/1993  . Smokeless tobacco: Never Used  . Alcohol Use: No  . Drug Use: No  . Sexual Activity: Not on file   Other Topics Concern  . Not on file   Social History Narrative   Lives in [place] with her fiance and 36 yr old dtr.    Current Outpatient Prescriptions on File Prior to Visit  Medication Sig Dispense Refill  . ACCU-CHEK FASTCLIX LANCETS MISC Use to check blood sugar 2 times per day. 100 each 2  . albuterol (PROVENTIL HFA;VENTOLIN HFA) 108 (90 BASE) MCG/ACT inhaler Inhale 1 puff into the lungs every 6 (six) hours as needed for wheezing or shortness of breath. 18 g 11  . atorvastatin (LIPITOR) 20 MG tablet Take 20 mg by mouth daily.    Marland Kitchen azithromycin (ZITHROMAX) 250 MG tablet Take 1 tablet (250 mg total) by mouth daily. 4 tablet 0  . BYETTA 5 MCG PEN 5 MCG/0.02ML SOPN injection INJECT 0.02 MLS (5 MCG TOTAL) SUBCUTANEOUSLY TWICE DAILY WITH A MEAL 2 pen 2  . carvedilol (COREG) 12.5 MG tablet Take 12.5 mg by mouth 2 (two) times daily with a meal.    . cetirizine (ZYRTEC) 10 MG tablet Take 1 tablet (10 mg total) by mouth daily. 30 tablet 11  . clotrimazole (LOTRIMIN) 1 %  cream APPLY TOPICALLY 2 TIMES A DAY AS NEEDED FOR SKIN YEAST 60 g 0  . diclofenac sodium (VOLTAREN) 1 % GEL Apply 2 g topically 2 (two) times daily. 100 g 0  . fluticasone (FLONASE) 50 MCG/ACT nasal spray Place 2 sprays into both nostrils daily. 16 g 6  . gabapentin (NEURONTIN) 100 MG capsule Take 1 capsule (100 mg total) by mouth at bedtime. 30 capsule 3  . HYDROcodone-acetaminophen (NORCO) 10-325 MG per tablet Take 1 tablet by mouth 3 (three) times daily as needed for moderate pain. Do not fill before 12/12/2014 90 tablet 0  . hydrocortisone (ANUSOL-HC) 2.5 % rectal cream Place 1 application rectally 2 (two) times daily. 30 g 0  . LANTUS SOLOSTAR 100 UNIT/ML Solostar Pen INJECT 150 UNITS SUBCUTANEOUSLY TWICE DAILY**PATIENT NEEDS APPOINTMENT FOR FURTHER REFILLS** 45 mL 0  . lisinopril (PRINIVIL,ZESTRIL) 10 MG  tablet Take 1 tablet (10 mg total) by mouth daily. 30 tablet 5  . NITROSTAT 0.4 MG SL tablet DISSOLVE 1 TABLET UNDER THE TONGUE EVERY 5 MINUTES AS NEEDED FOR CHEST PAIN. 30 tablet 1  . NOVOLOG FLEXPEN 100 UNIT/ML FlexPen INJECT 60 UNITS INTO THE SKIN THREE TIMES A DAY WITH MEALS. 30 mL 0  . pantoprazole (PROTONIX) 40 MG tablet Take 40 mg by mouth daily.    . SYMBICORT 160-4.5 MCG/ACT inhaler INHALE 2 PUFFS INTO LUNGS TWICE DAILY 10.2 g 4  . topiramate (TOPAMAX) 25 MG tablet TAKE 1 TABLET BY MOUTH EVERY NIGHT AT BEDTIME 30 tablet 3  . traMADol (ULTRAM) 50 MG tablet TAKE 1 TABLET BY MOUTH EVERY DAY AS NEEDED FOR MODERATE PAIN 15 tablet 2   No current facility-administered medications on file prior to visit.    Allergies  Allergen Reactions  . Kiwi Extract Anaphylaxis  . Nubain [Nalbuphine Hcl] Other (See Comments)    Nervous...makes her feel like something is crawling on her.    Family History  Problem Relation Age of Onset  . Diabetes Mother   . Hyperlipidemia Mother   . Depression Mother   . Heart attack Paternal Uncle   . Heart disease Paternal Grandmother   . Heart attack Paternal Grandmother   . Heart attack Paternal Grandfather   . Heart disease Paternal Grandfather     BP 128/90 mmHg  Pulse 100  Temp(Src) 98.4 F (36.9 C) (Oral)  Ht 5\' 2"  (1.575 m)  Wt 321 lb (145.605 kg)  BMI 58.70 kg/m2  SpO2 94%    Review of Systems She denies hypoglycemia.  She has lost weight.      Objective:   Physical Exam VITAL SIGNS:  See vs page GENERAL: no distress Pulses: dorsalis pedis intact bilat.   Feet: no deformity.  no edema Skin:  no ulcer on the feet.  normal color and temp.  Few hyperpigmented healed abrasions on the dorsal aspect of the right foot.   Neuro: sensation is intact to touch on the feet, but decreased from normal.      Assessment & Plan:  DM: control is apparently improved. Dyslipidemia: on rx.  Pt is advised to continue the same medication.     Noncompliance with cbg recording and f/u ov's: I'll work around this as best I can Weight loss, prob due to hyperglycemia.  Patient is advised the following: Patient Instructions  check your blood sugar twice a day.  vary the time of day when you check, between before the 3 meals, and at bedtime.  also check if you have symptoms of your blood  sugar being too high or too low.  please keep a record of the readings and bring it to your next appointment here.  You can write it on any piece of paper.  please call us sooner if your blood sugar goes below 70, or if you have a lot of readings over 200. In view of your medical condition, you should avoid pregnancy until we have decided it is safe.     Please come back for a follow-up appointment in 6 weeks.   blood and urine tests are being requested for you today.  We'll let you know about the results.

## 2014-11-17 DIAGNOSIS — L290 Pruritus ani: Secondary | ICD-10-CM | POA: Insufficient documentation

## 2014-11-17 NOTE — Progress Notes (Signed)
Patient ID: [name], female   DOB: February 09, 1976, 38 y.o.   MRN: [id number]  HPI:  L side pain: about 2 weeks ago had grease splash onto her left side. It was just small droplets of grease, not enough to leave a mark on her skin. She has had intermittent pain since that time, just on her skin. No nausea/vomiting, diarrhea. Would like something for pain relief.  OSA - has CPAP. Reports last sleep study was within the last 1-2 years. Needs new equipment but states cannot get this from [facility] until has rx. Reports that her CPAP settings were never adjusted after her most recent sleep study.  Rectal itching - has noticed this recently. Reports hx of hemorrhoids. Thinks they're flaring up a little. No rectal bleeding. Would like rx for this.  Skin issues - I previously rx'd anti yeast medicine for her. She's been using this as a moisturizer to help in her intertriginous areas to keep it from drying out. Needs refill.   ROS: See HPI.  [name]: hx T2DM, HTN, asthma, GERD, HLD, chronic pain  PHYSICAL EXAM: BP 132/86 mmHg  Pulse 99  Temp(Src) 98.4 F (36.9 C) (Oral)  Resp 18  Wt 324 lb (146.965 kg)  SpO2 99%  LMP 10/06/2014 Gen: NAD. Pleasant, cooperative HEENT: NCAT. Neuro: grossly nonfocal, speech normal Skin: L flank without any visible lesions. Skin normal. Ext: atraumatic  ASSESSMENT/PLAN:  Burn No signs of burn on exam. Possible hyperalgesia from chronic opioid use along with component of microscopic injury from grease. Hoped to do lidocaine topical but medicaid does not cover this. Will rx voltaren gel for relief. Instructed to use no longer than 1 week. Pt agreeable to this plan.  Skin infection Now using clotrimazole as skin moisturizer. Explained that I only want her to use this medicine when she has a flareup of the topical yeast infection. Recommend she use gentle lotion or vaseline as moisturizer during other times. She is agreeable to this plan.  Obstructive sleep  apnea Records reviewed. Had CPAP titration study in January 2015. Titrated to 13 cm H2O pressure. Will enter new orders in so that [facility] will adjust her CPAP & refill her equipment needs.  Rectal itching Reports recent rectal itching. Hx of hemorrhoids. No bleeding. Will empirically rx anusol HC and wait to see if pt receives relief from this. F/u if not improving.   FOLLOW UP: F/u in 2 months for chronic medical problems  [name], [place]

## 2014-11-17 NOTE — Assessment & Plan Note (Signed)
Records reviewed. Had CPAP titration study in January 2015. Titrated to 13 cm H2O pressure. Will enter new orders in so that [facility] will adjust her CPAP & refill her equipment needs.

## 2014-11-17 NOTE — Assessment & Plan Note (Signed)
Now using clotrimazole as skin moisturizer. Explained that I only want her to use this medicine when she has a flareup of the topical yeast infection. Recommend she use gentle lotion or vaseline as moisturizer during other times. She is agreeable to this plan.

## 2014-11-17 NOTE — Assessment & Plan Note (Signed)
Reports recent rectal itching. Hx of hemorrhoids. No bleeding. Will empirically rx anusol HC and wait to see if pt receives relief from this. F/u if not improving.

## 2014-11-18 LAB — LDL CHOLESTEROL, DIRECT: LDL DIRECT: 164.9 mg/dL

## 2014-11-19 ENCOUNTER — Other Ambulatory Visit: Payer: Self-pay

## 2014-11-19 MED ORDER — INSULIN ASPART 100 UNIT/ML FLEXPEN: PEN_INJECTOR | SUBCUTANEOUS | Status: DC

## 2014-12-03 ENCOUNTER — Ambulatory Visit (INDEPENDENT_AMBULATORY_CARE_PROVIDER_SITE_OTHER): Payer: Medicaid Other | Admitting: Family Medicine

## 2014-12-03 ENCOUNTER — Encounter: Payer: Self-pay | Admitting: Family Medicine

## 2014-12-03 VITALS — BP 156/99 | HR 99 | Temp 98.3°F | Ht 62.0 in | Wt 319.4 lb

## 2014-12-03 DIAGNOSIS — R208 Other disturbances of skin sensation: Secondary | ICD-10-CM

## 2014-12-03 MED ORDER — GABAPENTIN 300 MG PO CAPS: 300.0000 mg | ORAL_CAPSULE | Freq: Three times a day (TID) | ORAL | Status: DC

## 2014-12-03 NOTE — Patient Instructions (Signed)
It was great to see you again today!  Increase gabapentin to 300mg  three times daily for your skin discomfort. Let's see if this helps. I sent in a new prescription.  Follow up in a couple of weeks or sooner if getting worse, not able to eat/drink, problems with bowels/bladder, etc.  Be well, Dr. [name]

## 2014-12-04 ENCOUNTER — Other Ambulatory Visit: Payer: Self-pay | Admitting: Family Medicine

## 2014-12-05 ENCOUNTER — Encounter (HOSPITAL_COMMUNITY): Payer: Self-pay | Admitting: Emergency Medicine

## 2014-12-05 ENCOUNTER — Emergency Department (HOSPITAL_COMMUNITY)
Admission: EM | Admit: 2014-12-05 | Discharge: 2014-12-05 | Disposition: A | Discharge status: Home / Self Care | Payer: Medicaid Other | Attending: Emergency Medicine | Admitting: Emergency Medicine

## 2014-12-05 DIAGNOSIS — Z862 Personal history of diseases of the blood and blood-forming organs and certain disorders involving the immune mechanism: Secondary | ICD-10-CM | POA: Insufficient documentation

## 2014-12-05 DIAGNOSIS — Z872 Personal history of diseases of the skin and subcutaneous tissue: Secondary | ICD-10-CM | POA: Diagnosis not present

## 2014-12-05 DIAGNOSIS — M1990 Unspecified osteoarthritis, unspecified site: Secondary | ICD-10-CM | POA: Insufficient documentation

## 2014-12-05 DIAGNOSIS — F319 Bipolar disorder, unspecified: Secondary | ICD-10-CM | POA: Diagnosis not present

## 2014-12-05 DIAGNOSIS — Z794 Long term (current) use of insulin: Secondary | ICD-10-CM | POA: Insufficient documentation

## 2014-12-05 DIAGNOSIS — R109 Unspecified abdominal pain: Secondary | ICD-10-CM

## 2014-12-05 DIAGNOSIS — Z87891 Personal history of nicotine dependence: Secondary | ICD-10-CM | POA: Diagnosis not present

## 2014-12-05 DIAGNOSIS — J449 Chronic obstructive pulmonary disease, unspecified: Secondary | ICD-10-CM | POA: Diagnosis not present

## 2014-12-05 DIAGNOSIS — G8929 Other chronic pain: Secondary | ICD-10-CM | POA: Insufficient documentation

## 2014-12-05 DIAGNOSIS — E119 Type 2 diabetes mellitus without complications: Secondary | ICD-10-CM | POA: Diagnosis not present

## 2014-12-05 DIAGNOSIS — E785 Hyperlipidemia, unspecified: Secondary | ICD-10-CM | POA: Diagnosis not present

## 2014-12-05 DIAGNOSIS — Z7952 Long term (current) use of systemic steroids: Secondary | ICD-10-CM | POA: Insufficient documentation

## 2014-12-05 DIAGNOSIS — Z7951 Long term (current) use of inhaled steroids: Secondary | ICD-10-CM | POA: Insufficient documentation

## 2014-12-05 DIAGNOSIS — R1012 Left upper quadrant pain: Secondary | ICD-10-CM | POA: Insufficient documentation

## 2014-12-05 DIAGNOSIS — I10 Essential (primary) hypertension: Secondary | ICD-10-CM | POA: Diagnosis not present

## 2014-12-05 DIAGNOSIS — K219 Gastro-esophageal reflux disease without esophagitis: Secondary | ICD-10-CM | POA: Insufficient documentation

## 2014-12-05 DIAGNOSIS — Z79899 Other long term (current) drug therapy: Secondary | ICD-10-CM | POA: Diagnosis not present

## 2014-12-05 MED ORDER — HYDROMORPHONE HCL 1 MG/ML IJ SOLN
2.0000 mg | Freq: Once | INTRAMUSCULAR | Status: AC
Start: 2014-12-05 — End: 2014-12-05
  Administered 2014-12-05: 2 mg via INTRAMUSCULAR
  Filled 2014-12-05: qty 2

## 2014-12-05 MED ORDER — ONDANSETRON 4 MG PO TBDP
4.0000 mg | ORAL_TABLET | Freq: Once | ORAL | Status: AC
  Administered 2014-12-05: 4 mg via ORAL
  Filled 2014-12-05: qty 1

## 2014-12-05 NOTE — ED Provider Notes (Signed)
CSN: [id number]     Arrival date & time 12/05/14  0010 History  This chart was scribed for Fredia Sorrow, MD by Rayfield Citizen, ED Scribe. This patient was seen in room A08C/A08C and the patient's care was started at 1:02 AM.     Chief Complaint  Patient presents with  . Abdominal Pain   Patient is a 38 y.o. female presenting with abdominal pain. The history is provided by the patient. No language interpreter was used.  Abdominal Pain Pain location:  LUQ Pain quality: throbbing   Pain radiates to:  Back and L flank Pain severity:  Moderate Onset quality:  Gradual Duration:  8 weeks Timing:  Constant Progression:  Waxing and waning Chronicity:  Recurrent Relieved by:  Nothing Worsened by:  Nothing tried Ineffective treatments: Gabapentin. Associated symptoms: no chest pain, no chills, no cough, no diarrhea, no dysuria, no fever, no hematuria, no nausea, no shortness of breath, no sore throat and no vomiting   Risk factors: obesity      HPI Comments: [name] is a 38 y.o. female who presents to the Emergency Department complaining of 8 weeks of waxing and waning constant abdominal pain, rated 8-9/10 and described as throbbing. Nothing makes symptoms better or worse. Patient reports that she had a prior experience last year with similar symptoms. She was seen by her PCP and prescribed Gabapentin; this has not improved her symptoms at this time.   Neurologist is Dr. Jacqulynn Cadet with [facility]; appointment scheduled next week.   Past Medical History  Diagnosis Date  . Morbid obesity   . Alveolar hypoventilation   . Asthma   . GERD (gastroesophageal reflux disease)   . Rectal fissure   . Hypertension   . Costochondritis   . Cellulitis 08/2010-08/2011  . Depression   . Bipolar 2 disorder   . Obstructive sleep apnea   . Chest pain     NO CAD by cath 7/48/27, nl LV systolic fxn  . Diabetes mellitus 2000    Type 2, Uncontrolled  . Arthritis   . HLD (hyperlipidemia)   .  Anemia   . COPD (chronic obstructive pulmonary disease)    Past Surgical History  Procedure Laterality Date  . Carpal tunnel release    . Bil knee surgery    . Mass excision N/A 06/29/2013    Procedure:  WIDE LOCAL EXCISION OF POSTERIOR NECK ABSCESS;  Surgeon: Ralene Ok, MD;  Location: [facility];  Service: General;  Laterality: N/A;  . Cardiac catheterization    . Left heart catheterization with coronary angiogram N/A 07/27/2012    Procedure: LEFT HEART CATHETERIZATION WITH CORONARY ANGIOGRAM;  Surgeon: Sherren Mocha, MD;  Location: [facility] CATH LAB;  Service: Cardiovascular;  Laterality: N/A;   Family History  Problem Relation Age of Onset  . Diabetes Mother   . Hyperlipidemia Mother   . Depression Mother   . Heart attack Paternal Uncle   . Heart disease Paternal Grandmother   . Heart attack Paternal Grandmother   . Heart attack Paternal Grandfather   . Heart disease Paternal Grandfather    History  Substance Use Topics  . Smoking status: Former Smoker -- 0.30 packs/day for .3 years    Types: Cigarettes    Quit date: 12/06/1993  . Smokeless tobacco: Never Used  . Alcohol Use: No   OB History    No data available     Review of Systems  Constitutional: Negative for fever and chills.  HENT: Negative for rhinorrhea and sore  throat.   Respiratory: Negative for cough and shortness of breath.   Cardiovascular: Negative for chest pain and leg swelling.  Gastrointestinal: Positive for abdominal pain. Negative for nausea, vomiting and diarrhea.  Genitourinary: Negative for dysuria, frequency and hematuria.  Musculoskeletal: Positive for back pain.  Skin: Negative for rash.  Neurological: Positive for headaches.  Hematological: Does not bruise/bleed easily.  Psychiatric/Behavioral: Negative for confusion.      Allergies  Kiwi extract and Nubain  Home Medications   Prior to Admission medications   Medication Sig Start Date End Date Taking? Authorizing Provider  ACCU-CHEK  FASTCLIX LANCETS MISC Use to check blood sugar 2 times per day. 10/07/14   [name], MD  albuterol (PROVENTIL HFA;VENTOLIN HFA) 108 (90 BASE) MCG/ACT inhaler Inhale 1 puff into the lungs every 6 (six) hours as needed for wheezing or shortness of breath. 06/11/14   [name], MD  atorvastatin (LIPITOR) 20 MG tablet Take 20 mg by mouth daily.    Historical Provider, MD  azithromycin (ZITHROMAX) 250 MG tablet Take 1 tablet (250 mg total) by mouth daily. 10/24/14   Garald Balding, NP  BYETTA 5 MCG PEN 5 MCG/0.02ML SOPN injection INJECT 0.02 MLS (5 MCG TOTAL) SUBCUTANEOUSLY TWICE DAILY WITH A MEAL 10/07/14   [name], MD  carvedilol (COREG) 12.5 MG tablet Take 12.5 mg by mouth 2 (two) times daily with a meal.    Historical Provider, MD  cetirizine (ZYRTEC) 10 MG tablet Take 1 tablet (10 mg total) by mouth daily. 06/11/14   [name], MD  clotrimazole (LOTRIMIN) 1 % cream APPLY TOPICALLY 2 TIMES A DAY AS NEEDED FOR SKIN YEAST 11/08/14   Hilton Sinclair, MD  diclofenac sodium (VOLTAREN) 1 % GEL Apply 2 g topically 2 (two) times daily. 11/11/14   [name], MD  fluticasone (FLONASE) 50 MCG/ACT nasal spray Place 2 sprays into both nostrils daily. 05/21/14   Melony Overly, MD  gabapentin (NEURONTIN) 300 MG capsule Take 1 capsule (300 mg total) by mouth 3 (three) times daily. 12/03/14   [name], MD  HYDROcodone-acetaminophen (NORCO) 10-325 MG per tablet Take 1 tablet by mouth 3 (three) times daily as needed for moderate pain. Do not fill before 12/12/2014 10/14/14   [name], MD  hydrocortisone (ANUSOL-HC) 2.5 % rectal cream Place 1 application rectally 2 (two) times daily. 11/11/14   [name], MD  insulin aspart (NOVOLOG FLEXPEN) 100 UNIT/ML FlexPen INJECT 90 UNITS INTO THE SKIN THREE TIMES A DAY WITH MEALS. 11/19/14   [name], MD  LANTUS SOLOSTAR 100 UNIT/ML Solostar Pen INJECT 150 UNITS SUBCUTANEOUSLY TWICE DAILY**PATIENT NEEDS APPOINTMENT FOR  FURTHER REFILLS** 11/14/14   [name], MD  lisinopril (PRINIVIL,ZESTRIL) 10 MG tablet Take 1 tablet (10 mg total) by mouth daily. 03/12/14   [name], MD  NITROSTAT 0.4 MG SL tablet DISSOLVE 1 TABLET UNDER THE TONGUE EVERY 5 MINUTES AS NEEDED FOR CHEST PAIN. 11/13/14   [name], MD  pantoprazole (PROTONIX) 40 MG tablet Take 40 mg by mouth daily.    Historical Provider, MD  SYMBICORT 160-4.5 MCG/ACT inhaler INHALE 2 PUFFS INTO LUNGS TWICE DAILY 10/18/14   [name], MD  topiramate (TOPAMAX) 25 MG tablet TAKE 1 TABLET BY MOUTH EVERY NIGHT AT BEDTIME 10/14/14   [name], MD  traMADol (ULTRAM) 50 MG tablet TAKE 1 TABLET BY MOUTH EVERY DAY AS NEEDED FOR MODERATE PAIN 11/11/14   [name], MD  BP 127/84 mmHg  Pulse 101  Temp(Src) 97.5 F (36.4 C)  Resp 16  Ht 5\' 2"  (1.575 m)  Wt 316 lb (143.337 kg)  BMI 57.78 kg/m2  SpO2 99%  LMP 11/05/2014 Physical Exam  Constitutional: She is oriented to person, place, and time. She appears well-developed and well-nourished.  HENT:  Head: Normocephalic and atraumatic.  Mouth/Throat: Oropharynx is clear and moist. No oropharyngeal exudate.  Eyes: EOM are normal. Pupils are equal, round, and reactive to light.  Cardiovascular: Normal rate, regular rhythm and normal heart sounds.  Exam reveals no gallop and no friction rub.   No murmur heard. Pulmonary/Chest: Effort normal and breath sounds normal. No respiratory distress. She has no wheezes. She has no rales.  Abdominal: Soft. Bowel sounds are normal. There is no tenderness. There is no rebound and no guarding.  Abdominal pain; L1 dermatome radiating around her left side to LUQ  Musculoskeletal: Normal range of motion. She exhibits no edema.  Neurological: She is alert and oriented to person, place, and time.  Skin: Skin is warm and dry. No rash noted.  Psychiatric: She has a normal mood and affect. Her behavior is normal.  Nursing note and vitals  reviewed.   ED Course  Procedures   DIAGNOSTIC STUDIES: Oxygen Saturation is 99% on RA, normal by my interpretation.    COORDINATION OF CARE: 1:11 AM Discussed treatment plan with pt at bedside and pt agreed to plan.   Labs Review Labs Reviewed - No data to display  Imaging Review No results found.   EKG Interpretation None      MDM   Final diagnoses:  Chronic abdominal pain   Patient with history of chronic pain to the left side of the abdomen and the L1-L2 dermatome distribution. No rash. Family practice think it's possibility could be shingles-like pain. That's been going on since November 1. Patient RE has pain medicine at home. Patient was given of hydromorphone IM here to help her through the night. Patient followed by family practice and neurology. Patient nontoxic no acute distress.  I personally performed the services described in this documentation, which was scribed in my presence. The recorded information has been reviewed and is accurate.       Fredia Sorrow, MD 12/05/14 [phone]

## 2014-12-05 NOTE — ED Notes (Signed)
Pt wishes to be discharged now states she has to go.

## 2014-12-05 NOTE — ED Notes (Signed)
Pt. reports chronic LUQ pain for several weeks unrelieved by prescription Gabapentin , no emesis or diarrhea.

## 2014-12-05 NOTE — ED Notes (Signed)
PT reports nausea/vomiting, protocol orders placed for nausea medicine.

## 2014-12-05 NOTE — Discharge Instructions (Signed)
Follow-up with your family practice doctors and also follow-up with your neurologist. Return for any new or worse symptoms.

## 2014-12-06 NOTE — Progress Notes (Signed)
Patient ID: [name], female   DOB: Nov 25, 1976, 39 y.o.   MRN: [id number]  HPI:  Pt presents for a same day appointment to discuss abdominal pain.  Continues to have pain on skin of L flank. I saw her for this previously when she attributed it recently to a grease burn. I gave her rx for voltaren gel but she states this is not helping at all. Has burning pain in skin. The discomfort is not deeper in her abdomen just on the skin. The pain has gradually increased. Urinating and stooling normally. Normal PO intake. No fevers. Takes gabapentin 100mg  QHS for leg discomfort while sleeping.  Of note she had a similar episode of this type of pain on her L flank in January of this year.   ROS: See HPI  Barry: hx morbid obesity, chronic opioid use, poorly controlled T2DM, HTN  PHYSICAL EXAM: BP 156/99 mmHg  Pulse 99  Temp(Src) 98.3 F (36.8 C) (Oral)  Ht 5\' 2"  (1.575 m)  Wt 319 lb 6.4 oz (144.879 kg)  BMI 58.40 kg/m2  LMP 11/05/2014 Gen: NAD HEENT: NCAT Heart: RRR no murmurs Lungs: CTAB, NWOB Abdomen: soft, NTTP (other than skin tenderness described below), no organomegaly although morbid obesity limits exam Neuro: grossly nonfocal, speech normal Skin: L flank with tenderness along side wrapping around to front & back. No skin lesions whatsoever. Tender with simple gentle touch moreso than firmer palpation.  ASSESSMENT/PLAN:  1. Skin discomfort in L flank - similar to episode back in January of 2015, which self resolved after tx with gabapentin. Ddx includes postherpetic neuralgia, opioid induced hyperalgesia, among other things. Doubt valtrex would be of use right now since she has no skin lesions. Will increase gabapentin to 300mg  TID and monitor for relief. Counseled patient that this may make her sedated so she should use caution with it. F/u in a couple weeks to eval for improvement.  FOLLOW UP: F/u in 2-3 weeks for L flank pain.  [place]. [name], [place]

## 2014-12-11 ENCOUNTER — Other Ambulatory Visit: Payer: Self-pay | Admitting: Endocrinology

## 2014-12-13 ENCOUNTER — Ambulatory Visit: Payer: Medicaid Other | Admitting: Neurology

## 2014-12-17 ENCOUNTER — Encounter: Payer: Self-pay | Admitting: Family Medicine

## 2014-12-17 ENCOUNTER — Ambulatory Visit (INDEPENDENT_AMBULATORY_CARE_PROVIDER_SITE_OTHER): Payer: Medicaid Other | Admitting: Family Medicine

## 2014-12-17 VITALS — BP 134/92 | HR 92 | Temp 97.8°F | Wt 320.0 lb

## 2014-12-17 DIAGNOSIS — G580 Intercostal neuropathy: Secondary | ICD-10-CM | POA: Insufficient documentation

## 2014-12-17 MED ORDER — NORTRIPTYLINE HCL 25 MG PO CAPS: 25.0000 mg | ORAL_CAPSULE | Freq: Every day | ORAL | Status: DC

## 2014-12-17 NOTE — Assessment & Plan Note (Signed)
Pt description and course very well favor some type of neuropathy.  Agree this could be shingles with now a post-herpetic nerualgia, but other considerations would be CRPS or Diabetic neuropathy (very unlikely) - Either way, will start Pamelor 25 mg qhs and titrate upwards.  F/U with PCP in 1-2 weeks to see how doing - No red flags on exam and does not appear to be underlying from anything beneath the dermatomal region.  However, one etiology could be discogenic in the thoracic region.  If no improvement over the next month or so, would consider thoracic XRay or further evaluation for something internal.

## 2014-12-17 NOTE — Patient Instructions (Signed)
Nortriptyline capsules What is this medicine? NORTRIPTYLINE (nor TRIP ti leen) is used to treat depression. This medicine may be used for other purposes; ask your health care provider or pharmacist if you have questions. COMMON BRAND NAME(S): Aventyl, Pamelor What should I tell my health care provider before I take this medicine? They need to know if you have any of these conditions: -an alcohol problem -bipolar disorder or schizophrenia -difficulty passing urine, prostate trouble -glaucoma -heart disease or recent heart attack -liver disease -over active thyroid -seizures -thoughts or plans of suicide or a previous suicide attempt or family history of suicide attempt -an unusual or allergic reaction to nortriptyline, other medicines, foods, dyes, or preservatives -pregnant or trying to get pregnant -breast-feeding How should I use this medicine? Take this medicine by mouth with a glass of water. Follow the directions on the prescription label. Take your doses at regular intervals. Do not take it more often than directed. Do not stop taking this medicine suddenly except upon the advice of your doctor. Stopping this medicine too quickly may cause serious side effects or your condition may worsen. A special MedGuide will be given to you by the pharmacist with each prescription and refill. Be sure to read this information carefully each time. Talk to your pediatrician regarding the use of this medicine in children. Special care may be needed. Overdosage: If you think you have taken too much of this medicine contact a poison control center or emergency room at once. NOTE: This medicine is only for you. Do not share this medicine with others. What if I miss a dose? If you miss a dose, take it as soon as you can. If it is almost time for your next dose, take only that dose. Do not take double or extra doses. What may interact with this medicine? Do not take this medicine with any of the  following medications: -arsenic trioxide -certain medicines medicines for irregular heart beat -cisapride -halofantrine -linezolid -MAOIs like Carbex, Eldepryl, Marplan, Nardil, and Parnate -methylene blue (injected into a vein) -other medicines for mental depression -phenothiazines like perphenazine, thioridazine and chlorpromazine -pimozide -probucol -procarbazine -sparfloxacin -St. John's Wort -ziprasidone This medicine may also interact with any of the following medications: -atropine and related drugs like hyoscyamine, scopolamine, tolterodine and others -barbiturate medicines for inducing sleep or treating seizures, such as phenobarbital -cimetidine -medicines for diabetes -medicines for seizures like carbamazepine or phenytoin -reserpine -thyroid medicine This list may not describe all possible interactions. Give your health care provider a list of all the medicines, herbs, non-prescription drugs, or dietary supplements you use. Also tell them if you smoke, drink alcohol, or use illegal drugs. Some items may interact with your medicine. What should I watch for while using this medicine? Tell your doctor if your symptoms do not get better or if they get worse. Visit your doctor or health care professional for regular checks on your progress. Because it may take several weeks to see the full effects of this medicine, it is important to continue your treatment as prescribed by your doctor. Patients and their families should watch out for new or worsening thoughts of suicide or depression. Also watch out for sudden changes in feelings such as feeling anxious, agitated, panicky, irritable, hostile, aggressive, impulsive, severely restless, overly excited and hyperactive, or not being able to sleep. If this happens, especially at the beginning of treatment or after a change in dose, call your health care professional. You may get drowsy or dizzy. Do not drive,   use machinery, or do  anything that needs mental alertness until you know how this medicine affects you. Do not stand or sit up quickly, especially if you are an older patient. This reduces the risk of dizzy or fainting spells. Alcohol may interfere with the effect of this medicine. Avoid alcoholic drinks. Do not treat yourself for coughs, colds, or allergies without asking your doctor or health care professional for advice. Some ingredients can increase possible side effects. Your mouth may get dry. Chewing sugarless gum or sucking hard candy, and drinking plenty of water may help. Contact your doctor if the problem does not go away or is severe. This medicine may cause dry eyes and blurred vision. If you wear contact lenses you may feel some discomfort. Lubricating drops may help. See your eye doctor if the problem does not go away or is severe. This medicine can cause constipation. Try to have a bowel movement at least every 2 to 3 days. If you do not have a bowel movement for 3 days, call your doctor or health care professional. This medicine can make you more sensitive to the sun. Keep out of the sun. If you cannot avoid being in the sun, wear protective clothing and use sunscreen. Do not use sun lamps or tanning beds/booths. What side effects may I notice from receiving this medicine? Side effects that you should report to your doctor or health care professional as soon as possible: -allergic reactions like skin rash, itching or hives, swelling of the face, lips, or tongue -abnormal production of milk in females -breast enlargement in both males and females -breathing problems -confusion, hallucinations -fever with increased sweating -irregular or fast, pounding heartbeat -muscle stiffness, or spasms -pain or difficulty passing urine, loss of bladder control -seizures -suicidal thoughts or other mood changes -swelling of the testicles -tingling, pain, or numbness in the feet or hands -yellowing of the eyes or  skin Side effects that usually do not require medical attention (report to your doctor or health care professional if they continue or are bothersome): -change in sex drive or performance -diarrhea -nausea, vomiting -weight gain or loss This list may not describe all possible side effects. Call your doctor for medical advice about side effects. You may report side effects to FDA at 1-800-FDA-1088. Where should I keep my medicine? Keep out of the reach of children. Store at room temperature between 15 and 30 degrees C (59 and 86 degrees F). Keep container tightly closed. Throw away any unused medicine after the expiration date. NOTE: This sheet is a summary. It may not cover all possible information. If you have questions about this medicine, talk to your doctor, pharmacist, or health care provider.  2015, Elsevier/Gold Standard. (2012-04-10 13:57:12)  

## 2014-12-17 NOTE — Progress Notes (Signed)
Patient requests refill for Hydrocodone 10-325 mg because she will not have it until 02/15 when she has an appointment. Please, follow up with Patient.

## 2014-12-17 NOTE — Progress Notes (Signed)
[name] is a 39 y.o. female who presents today for ongoing neuropathy.  Neuropathy - Ongoing chronic pain distribution along the T7-T8 dermatome along the L side of her body that goes from the back to the center of her abdomen.  Denies any N/V/D, abdominal pain, fever, chills, weight loss, weakness in her extremities, centralized abdominal pain, SOB.  Pain is tingling/burning in sensation.  Denies any rash or previous rash at this point.  Has been on chronic opioids which help but also on Neurontin 300 mg TID which she does not note a difference.     Current Outpatient Prescriptions on File Prior to Visit  Medication Sig Dispense Refill  . ACCU-CHEK FASTCLIX LANCETS MISC Use to check blood sugar 2 times per day. 100 each 2  . albuterol (PROVENTIL HFA;VENTOLIN HFA) 108 (90 BASE) MCG/ACT inhaler Inhale 1 puff into the lungs every 6 (six) hours as needed for wheezing or shortness of breath. 18 g 11  . atorvastatin (LIPITOR) 20 MG tablet Take 20 mg by mouth daily.    Marland Kitchen azithromycin (ZITHROMAX) 250 MG tablet Take 1 tablet (250 mg total) by mouth daily. 4 tablet 0  . BYETTA 5 MCG PEN 5 MCG/0.02ML SOPN injection INJECT 0.02 MLS (5 MCG TOTAL) SUBCUTANEOUSLY TWICE DAILY WITH A MEAL 2 pen 2  . carvedilol (COREG) 12.5 MG tablet Take 12.5 mg by mouth 2 (two) times daily with a meal.    . cetirizine (ZYRTEC) 10 MG tablet Take 1 tablet (10 mg total) by mouth daily. 30 tablet 11  . clotrimazole (LOTRIMIN) 1 % cream APPLY TOPICALLY 2 TIMES A DAY AS NEEDED FOR SKIN YEAST 60 g 0  . diclofenac sodium (VOLTAREN) 1 % GEL Apply 2 g topically 2 (two) times daily. 100 g 0  . fluticasone (FLONASE) 50 MCG/ACT nasal spray Place 2 sprays into both nostrils daily. 16 g 6  . gabapentin (NEURONTIN) 300 MG capsule Take 1 capsule (300 mg total) by mouth 3 (three) times daily. 90 capsule 2  . HYDROcodone-acetaminophen (NORCO) 10-325 MG per tablet Take 1 tablet by mouth 3 (three) times daily as needed for moderate pain. Do not  fill before 12/12/2014 90 tablet 0  . hydrocortisone (ANUSOL-HC) 2.5 % rectal cream Place 1 application rectally 2 (two) times daily. 30 g 0  . insulin aspart (NOVOLOG FLEXPEN) 100 UNIT/ML FlexPen INJECT 90 UNITS INTO THE SKIN THREE TIMES A DAY WITH MEALS. 30 pen 2  . Insulin Glargine (LANTUS SOLOSTAR) 100 UNIT/ML Solostar Pen Inject 150 units twice daily. 30 pen 2  . lisinopril (PRINIVIL,ZESTRIL) 10 MG tablet Take 1 tablet (10 mg total) by mouth daily. 30 tablet 5  . NITROSTAT 0.4 MG SL tablet DISSOLVE 1 TABLET UNDER THE TONGUE EVERY 5 MINUTES AS NEEDED FOR CHEST PAIN. 30 tablet 1  . pantoprazole (PROTONIX) 40 MG tablet Take 40 mg by mouth daily.    . SYMBICORT 160-4.5 MCG/ACT inhaler INHALE 2 PUFFS INTO LUNGS TWICE DAILY 10.2 g 4  . topiramate (TOPAMAX) 25 MG tablet TAKE 1 TABLET BY MOUTH EVERY NIGHT AT BEDTIME 30 tablet 3  . traMADol (ULTRAM) 50 MG tablet TAKE 1 TABLET BY MOUTH EVERY DAY AS NEEDED FOR MODERATE PAIN 15 tablet 2   No current facility-administered medications on file prior to visit.    ROS: Per HPI.  All other systems reviewed and are negative.   Physical Exam Filed Vitals:   12/17/14 0906  BP: 134/92  Pulse: 92  Temp: 97.8 F (36.6 C)  Physical Examination: General appearance - alert, well appearing, and in no distress Back exam - full range of motion, no tenderness, palpable spasm or pain on motion Neurological - no focal deficits Skin - normal coloration and turgor, no rashes, no suspicious skin lesions noted

## 2014-12-18 ENCOUNTER — Other Ambulatory Visit: Payer: Self-pay | Admitting: Family Medicine

## 2014-12-18 MED ORDER — HYDROCODONE-ACETAMINOPHEN 10-325 MG PO TABS: 1.0000 | ORAL_TABLET | Freq: Three times a day (TID) | ORAL | Status: DC | PRN

## 2014-12-18 NOTE — Progress Notes (Signed)
Patient ID: [name], female   DOB: October 28, 1976, 39 y.o.   MRN: [id number]  [name] to refill without office visit this one time since I am not in clinic much this month. Will place rx at front for patient to pick up. Please inform pt.  [name], MD

## 2014-12-18 NOTE — Progress Notes (Signed)
Patient informed. 

## 2014-12-19 ENCOUNTER — Encounter (HOSPITAL_COMMUNITY): Payer: Self-pay | Admitting: Emergency Medicine

## 2014-12-19 ENCOUNTER — Emergency Department (HOSPITAL_COMMUNITY)
Admission: EM | Admit: 2014-12-19 | Discharge: 2014-12-19 | Discharge status: Against Medical Advice | Payer: Medicaid Other | Attending: Emergency Medicine | Admitting: Emergency Medicine

## 2014-12-19 DIAGNOSIS — I10 Essential (primary) hypertension: Secondary | ICD-10-CM | POA: Insufficient documentation

## 2014-12-19 DIAGNOSIS — R109 Unspecified abdominal pain: Secondary | ICD-10-CM | POA: Diagnosis present

## 2014-12-19 DIAGNOSIS — E119 Type 2 diabetes mellitus without complications: Secondary | ICD-10-CM | POA: Insufficient documentation

## 2014-12-19 DIAGNOSIS — J449 Chronic obstructive pulmonary disease, unspecified: Secondary | ICD-10-CM | POA: Insufficient documentation

## 2014-12-19 NOTE — ED Notes (Signed)
Pt is c/o left flank pain that started some time ago  Pt states she has been to the dr 3 times for same and has been put on neurontin and other medications without relief  Pt states the pain is sharp in nature at times and it keeps her from being able to sleep

## 2014-12-19 NOTE — ED Notes (Signed)
RN with another pt and was told pt was leaving. When RN tried to talk with pt, pt kept walking. Charge nurse aware.

## 2014-12-27 ENCOUNTER — Emergency Department (HOSPITAL_COMMUNITY)
Admission: EM | Admit: 2014-12-27 | Discharge: 2014-12-27 | Disposition: A | Discharge status: Home / Self Care | Payer: Medicaid Other | Attending: Emergency Medicine | Admitting: Emergency Medicine

## 2014-12-27 ENCOUNTER — Ambulatory Visit: Payer: Medicaid Other | Admitting: Endocrinology

## 2014-12-27 ENCOUNTER — Encounter (HOSPITAL_COMMUNITY): Payer: Self-pay | Admitting: Emergency Medicine

## 2014-12-27 DIAGNOSIS — Z79899 Other long term (current) drug therapy: Secondary | ICD-10-CM | POA: Diagnosis not present

## 2014-12-27 DIAGNOSIS — Z87891 Personal history of nicotine dependence: Secondary | ICD-10-CM | POA: Insufficient documentation

## 2014-12-27 DIAGNOSIS — E785 Hyperlipidemia, unspecified: Secondary | ICD-10-CM | POA: Diagnosis not present

## 2014-12-27 DIAGNOSIS — M79672 Pain in left foot: Secondary | ICD-10-CM | POA: Insufficient documentation

## 2014-12-27 DIAGNOSIS — F319 Bipolar disorder, unspecified: Secondary | ICD-10-CM | POA: Insufficient documentation

## 2014-12-27 DIAGNOSIS — Z3202 Encounter for pregnancy test, result negative: Secondary | ICD-10-CM | POA: Insufficient documentation

## 2014-12-27 DIAGNOSIS — E662 Morbid (severe) obesity with alveolar hypoventilation: Secondary | ICD-10-CM | POA: Insufficient documentation

## 2014-12-27 DIAGNOSIS — Z9889 Other specified postprocedural states: Secondary | ICD-10-CM | POA: Insufficient documentation

## 2014-12-27 DIAGNOSIS — E1165 Type 2 diabetes mellitus with hyperglycemia: Secondary | ICD-10-CM | POA: Diagnosis not present

## 2014-12-27 DIAGNOSIS — Z7951 Long term (current) use of inhaled steroids: Secondary | ICD-10-CM | POA: Insufficient documentation

## 2014-12-27 DIAGNOSIS — J449 Chronic obstructive pulmonary disease, unspecified: Secondary | ICD-10-CM | POA: Diagnosis not present

## 2014-12-27 DIAGNOSIS — M549 Dorsalgia, unspecified: Secondary | ICD-10-CM | POA: Insufficient documentation

## 2014-12-27 DIAGNOSIS — N644 Mastodynia: Secondary | ICD-10-CM | POA: Insufficient documentation

## 2014-12-27 DIAGNOSIS — I10 Essential (primary) hypertension: Secondary | ICD-10-CM | POA: Diagnosis not present

## 2014-12-27 DIAGNOSIS — M1990 Unspecified osteoarthritis, unspecified site: Secondary | ICD-10-CM | POA: Diagnosis not present

## 2014-12-27 DIAGNOSIS — Z862 Personal history of diseases of the blood and blood-forming organs and certain disorders involving the immune mechanism: Secondary | ICD-10-CM | POA: Diagnosis not present

## 2014-12-27 DIAGNOSIS — K219 Gastro-esophageal reflux disease without esophagitis: Secondary | ICD-10-CM | POA: Insufficient documentation

## 2014-12-27 DIAGNOSIS — G8929 Other chronic pain: Secondary | ICD-10-CM | POA: Insufficient documentation

## 2014-12-27 LAB — I-STAT CHEM 8, ED
BUN: 13 mg/dL (ref 6–23)
CHLORIDE: 100 meq/L (ref 96–112)
Calcium, Ion: 1.14 mmol/L (ref 1.12–1.23)
Creatinine, Ser: 0.4 mg/dL — ABNORMAL LOW (ref 0.50–1.10)
Glucose, Bld: 329 mg/dL — ABNORMAL HIGH (ref 70–99)
HCT: 42 % (ref 36.0–46.0)
Hemoglobin: 14.3 g/dL (ref 12.0–15.0)
POTASSIUM: 4.2 mmol/L (ref 3.5–5.1)
SODIUM: 135 mmol/L (ref 135–145)
TCO2: 22 mmol/L (ref 0–100)

## 2014-12-27 LAB — PREGNANCY, URINE: PREG TEST UR: NEGATIVE

## 2014-12-27 MED ORDER — OXYCODONE HCL 5 MG PO TABS
5.0000 mg | ORAL_TABLET | Freq: Once | ORAL | Status: AC
  Administered 2014-12-27: 5 mg via ORAL
  Filled 2014-12-27: qty 1

## 2014-12-27 NOTE — Discharge Instructions (Signed)
Diabetes Mellitus and Food It is important for you to manage your blood sugar (glucose) level. Your blood glucose level can be greatly affected by what you eat. Eating healthier foods in the appropriate amounts throughout the day at about the same time each day will help you control your blood glucose level. It can also help slow or prevent worsening of your diabetes mellitus. Healthy eating may even help you improve the level of your blood pressure and reach or maintain a healthy weight.  HOW CAN FOOD AFFECT ME? Carbohydrates Carbohydrates affect your blood glucose level more than any other type of food. Your dietitian will help you determine how many carbohydrates to eat at each meal and teach you how to count carbohydrates. Counting carbohydrates is important to keep your blood glucose at a healthy level, especially if you are using insulin or taking certain medicines for diabetes mellitus. Alcohol Alcohol can cause sudden decreases in blood glucose (hypoglycemia), especially if you use insulin or take certain medicines for diabetes mellitus. Hypoglycemia can be a life-threatening condition. Symptoms of hypoglycemia (sleepiness, dizziness, and disorientation) are similar to symptoms of having too much alcohol.  If your health care provider has given you approval to drink alcohol, do so in moderation and use the following guidelines:  Women should not have more than one drink per day, and men should not have more than two drinks per day. One drink is equal to:  12 oz of beer.  5 oz of wine.  1 oz of hard liquor.  Do not drink on an empty stomach.  Keep yourself hydrated. Have water, diet soda, or unsweetened iced tea.  Regular soda, juice, and other mixers might contain a lot of carbohydrates and should be counted. WHAT FOODS ARE NOT RECOMMENDED? As you make food choices, it is important to remember that all foods are not the same. Some foods have fewer nutrients per serving than other  foods, even though they might have the same number of calories or carbohydrates. It is difficult to get your body what it needs when you eat foods with fewer nutrients. Examples of foods that you should avoid that are high in calories and carbohydrates but low in nutrients include:  Trans fats (most processed foods list trans fats on the Nutrition Facts label).  Regular soda.  Juice.  Candy.  Sweets, such as cake, pie, doughnuts, and cookies.  Fried foods. WHAT FOODS CAN I EAT? Have nutrient-rich foods, which will nourish your body and keep you healthy. The food you should eat also will depend on several factors, including:  The calories you need.  The medicines you take.  Your weight.  Your blood glucose level.  Your blood pressure level.  Your cholesterol level. You also should eat a variety of foods, including:  Protein, such as meat, poultry, fish, tofu, nuts, and seeds (lean animal proteins are best).  Fruits.  Vegetables.  Dairy products, such as milk, cheese, and yogurt (low fat is best).  Breads, grains, pasta, cereal, rice, and beans.  Fats such as olive oil, trans fat-free margarine, canola oil, avocado, and olives. DOES EVERYONE WITH DIABETES MELLITUS HAVE THE SAME MEAL PLAN? Because every person with diabetes mellitus is different, there is not one meal plan that works for everyone. It is very important that you meet with a dietitian who will help you create a meal plan that is just right for you. Document Released: 08/19/2005 Document Revised: 11/27/2013 Document Reviewed: 10/19/2013 [facility] Patient Information 2015 [place], [place]. This  information is not intended to replace advice given to you by your health care provider. Make sure you discuss any questions you have with your health care provider. Chronic Pain Chronic pain can be defined as pain that is off and on and lasts for 3-6 months or longer. Many things cause chronic pain, which can make it  difficult to make a diagnosis. There are many treatment options available for chronic pain. However, finding a treatment that works well for you may require trying various approaches until the right one is found. Many people benefit from a combination of two or more types of treatment to control their pain. SYMPTOMS  Chronic pain can occur anywhere in the body and can range from mild to very severe. Some types of chronic pain include:  Headache.  Low back pain.  Cancer pain.  Arthritis pain.  Neurogenic pain. This is pain resulting from damage to nerves. People with chronic pain may also have other symptoms such as:  Depression.  Anger.  Insomnia.  Anxiety. DIAGNOSIS  Your health care provider will help diagnose your condition over time. In many cases, the initial focus will be on excluding possible conditions that could be causing the pain. Depending on your symptoms, your health care provider may order tests to diagnose your condition. Some of these tests may include:   Blood tests.   CT scan.   MRI.   X-rays.   Ultrasounds.   Nerve conduction studies.  You may need to see a specialist.  TREATMENT  Finding treatment that works well may take time. You may be referred to a pain specialist. He or she may prescribe medicine or therapies, such as:   Mindful meditation or yoga.  Shots (injections) of numbing or pain-relieving medicines into the spine or area of pain.  Local electrical stimulation.  Acupuncture.   Massage therapy.   Aroma, color, light, or sound therapy.   Biofeedback.   Working with a physical therapist to keep from getting stiff.   Regular, gentle exercise.   Cognitive or behavioral therapy.   Group support.  Sometimes, surgery may be recommended.  HOME CARE INSTRUCTIONS   Take all medicines as directed by your health care provider.   Lessen stress in your life by relaxing and doing things such as listening to calming  music.   Exercise or be active as directed by your health care provider.   Eat a healthy diet and include things such as vegetables, fruits, fish, and lean meats in your diet.   Keep all follow-up appointments with your health care provider.   Attend a support group with others suffering from chronic pain. SEEK MEDICAL CARE IF:   Your pain gets worse.   You develop a new pain that was not there before.   You cannot tolerate medicines given to you by your health care provider.   You have new symptoms since your last visit with your health care provider.  SEEK IMMEDIATE MEDICAL CARE IF:   You feel weak.   You have decreased sensation or numbness.   You lose control of bowel or bladder function.   Your pain suddenly gets much worse.   You develop shaking.  You develop chills.  You develop confusion.  You develop chest pain.  You develop shortness of breath.  MAKE SURE YOU:  Understand these instructions.  Will watch your condition.  Will get help right away if you are not doing well or get worse. Document Released: 08/14/2002 Document Revised: 07/25/2013 Document  Reviewed: 05/18/2013 [employer] Patient Information 2015 [place], [place]. This information is not intended to replace advice given to you by your health care provider. Make sure you discuss any questions you have with your health care provider.

## 2014-12-27 NOTE — ED Provider Notes (Signed)
CSN: [id number]     Arrival date & time 12/27/14  2025 History   First MD Initiated Contact with Patient 12/27/14 [phone]     Chief Complaint  Patient presents with  . Breast Pain  . Back Pain  . Foot Pain     The history is provided by the patient. No language interpreter was used.   [name] presents for evaluation of left-sided breast and back pain. Her pain has been constant for the last several months. Pain is described as pain and sharp at times. There is no clear aggravating or alleviating symptoms. She also has associated itching at the painful areas. She's been seen by multiple providers for this pain. She has no shortness of breath, cough, fevers, abdominal pain, vomiting, diarrhea, dysuria. She's been started on gabapentin and has not had any change in her symptoms. For the last few days she's had cramping in her left foot where her toes will at times drop up. She has a history of poorly controlled diabetes. Symptoms are moderate and constant.  Past Medical History  Diagnosis Date  . Morbid obesity   . Alveolar hypoventilation   . Asthma   . GERD (gastroesophageal reflux disease)   . Rectal fissure   . Hypertension   . Costochondritis   . Cellulitis 08/2010-08/2011  . Depression   . Bipolar 2 disorder   . Obstructive sleep apnea   . Chest pain     NO CAD by cath 05/29/75, nl LV systolic fxn  . Diabetes mellitus 2000    Type 2, Uncontrolled  . Arthritis   . HLD (hyperlipidemia)   . Anemia   . COPD (chronic obstructive pulmonary disease)    Past Surgical History  Procedure Laterality Date  . Carpal tunnel release    . Bil knee surgery    . Mass excision N/A 06/29/2013    Procedure:  WIDE LOCAL EXCISION OF POSTERIOR NECK ABSCESS;  Surgeon: Ralene Ok, MD;  Location: [place];  Service: General;  Laterality: N/A;  . Cardiac catheterization    . Left heart catheterization with coronary angiogram N/A 07/27/2012    Procedure: LEFT HEART CATHETERIZATION WITH CORONARY  ANGIOGRAM;  Surgeon: Sherren Mocha, MD;  Location: [facility] CATH LAB;  Service: Cardiovascular;  Laterality: N/A;   Family History  Problem Relation Age of Onset  . Diabetes Mother   . Hyperlipidemia Mother   . Depression Mother   . Heart attack Paternal Uncle   . Heart disease Paternal Grandmother   . Heart attack Paternal Grandmother   . Heart attack Paternal Grandfather   . Heart disease Paternal Grandfather    History  Substance Use Topics  . Smoking status: Former Smoker -- 0.30 packs/day for .3 years    Types: Cigarettes    Quit date: 12/06/1993  . Smokeless tobacco: Never Used  . Alcohol Use: No   OB History    No data available     Review of Systems  All other systems reviewed and are negative.     Allergies  Kiwi extract and Nubain  Home Medications   Prior to Admission medications   Medication Sig Start Date End Date Taking? Authorizing Provider  ACCU-CHEK FASTCLIX LANCETS MISC Use to check blood sugar 2 times per day. 10/07/14   [name], MD  albuterol (PROVENTIL HFA;VENTOLIN HFA) 108 (90 BASE) MCG/ACT inhaler Inhale 1 puff into the lungs every 6 (six) hours as needed for wheezing or shortness of breath. 06/11/14   [name], MD  atorvastatin (LIPITOR) 20 MG tablet Take 20 mg by mouth daily.    Historical Provider, MD  BYETTA 5 MCG PEN 5 MCG/0.02ML SOPN injection INJECT 0.02 MLS (5 MCG TOTAL) SUBCUTANEOUSLY TWICE DAILY WITH A MEAL 10/07/14   [name], MD  carvedilol (COREG) 12.5 MG tablet Take 12.5 mg by mouth 2 (two) times daily with a meal.    Historical Provider, MD  cetirizine (ZYRTEC) 10 MG tablet Take 1 tablet (10 mg total) by mouth daily. Patient not taking: Reported on 12/19/2014 06/11/14   [name], MD  clotrimazole (LOTRIMIN) 1 % cream APPLY TOPICALLY 2 TIMES A DAY AS NEEDED FOR SKIN YEAST Patient not taking: Reported on 12/19/2014 11/08/14   Hilton Sinclair, MD  diclofenac sodium (VOLTAREN) 1 % GEL Apply 2 g topically 2 (two)  times daily. 11/11/14   [name], MD  fluticasone (FLONASE) 50 MCG/ACT nasal spray Place 2 sprays into both nostrils daily. 05/21/14   Melony Overly, MD  gabapentin (NEURONTIN) 300 MG capsule Take 1 capsule (300 mg total) by mouth 3 (three) times daily. 12/03/14   [name], MD  HYDROcodone-acetaminophen (NORCO) 10-325 MG per tablet Take 1 tablet by mouth 3 (three) times daily as needed for moderate pain. Do not fill before 01/11/2015 12/18/14   [name], MD  hydrocortisone (ANUSOL-HC) 2.5 % rectal cream Place 1 application rectally 2 (two) times daily. Patient not taking: Reported on 12/19/2014 11/11/14   [name], MD  insulin aspart (NOVOLOG FLEXPEN) 100 UNIT/ML FlexPen INJECT 90 UNITS INTO THE SKIN THREE TIMES A DAY WITH MEALS. 11/19/14   [name], MD  Insulin Glargine (LANTUS SOLOSTAR) 100 UNIT/ML Solostar Pen Inject 150 units twice daily. 12/12/14   [name], MD  lisinopril (PRINIVIL,ZESTRIL) 10 MG tablet Take 1 tablet (10 mg total) by mouth daily. 03/12/14   [name], MD  NITROSTAT 0.4 MG SL tablet DISSOLVE 1 TABLET UNDER THE TONGUE EVERY 5 MINUTES AS NEEDED FOR CHEST PAIN. 11/13/14   [name], MD  nortriptyline (PAMELOR) 25 MG capsule Take 1 capsule (25 mg total) by mouth at bedtime. 12/17/14   Bryan R Hess, DO  pantoprazole (PROTONIX) 40 MG tablet Take 40 mg by mouth daily.    Historical Provider, MD  SYMBICORT 160-4.5 MCG/ACT inhaler INHALE 2 PUFFS INTO LUNGS TWICE DAILY 10/18/14   [name], MD  topiramate (TOPAMAX) 25 MG tablet TAKE 1 TABLET BY MOUTH EVERY NIGHT AT BEDTIME 10/14/14   [name], MD  traMADol (ULTRAM) 50 MG tablet TAKE 1 TABLET BY MOUTH EVERY DAY AS NEEDED FOR MODERATE PAIN 11/11/14   [name], MD   BP 111/65 mmHg  Pulse 91  Temp(Src) 97.7 F (36.5 C) (Oral)  Resp 16  SpO2 97%  LMP 12/18/2014 (Exact Date) Physical Exam  Constitutional: She is oriented to person, place, and time.  She appears well-developed and well-nourished.  Obese  HENT:  Head: Normocephalic and atraumatic.  Neck:  No T or L-spine tenderness  Cardiovascular: Normal rate and regular rhythm.   No murmur heard. Pulmonary/Chest: Effort normal and breath sounds normal. No respiratory distress. She exhibits no tenderness.  Abdominal: Soft. There is no tenderness. There is no rebound and no guarding.  Musculoskeletal: She exhibits no edema or tenderness.  2+ DP pulses, no cellulitis, no appreciable swelling or tenderness over the left foot  Neurological: She is alert and oriented to person, place, and time.  5 out of 5 strength  in bilateral lower extremities  Skin: Skin is warm and dry.  Psychiatric: She has a normal mood and affect. Her behavior is normal.  Nursing note and vitals reviewed.   ED Course  Procedures (including critical care time) Labs Review Labs Reviewed  I-STAT CHEM 8, ED - Abnormal; Notable for the following:    Creatinine, Ser 0.40 (*)    Glucose, Bld 329 (*)    All other components within normal limits  PREGNANCY, URINE    Imaging Review No results found.   EKG Interpretation None      MDM   Final diagnoses:  Pain of left breast  Hyperglycemia due to type 2 diabetes mellitus    Patient here for evaluation of chronic breast and side pain. There is no evidence of acute infectious process or shingles at this time. Pain is atypical for ACS, pneumonia, PE. Discussed with patient PCP follow-up for chronic pain. Terms of foot pain and there is no evidence of acute injury or acute infectious process process. Clinical picture is not consistent with gout, arterial occlusion, DVT. Recommend diabetes control and control of hyperglycemia and PCP follow-up. Return precautions were discussed.    Quintella Reichert, MD 12/27/14 [phone]

## 2014-12-27 NOTE — ED Notes (Signed)
Pt from home with c/o pain under left breast and left back x 2.5 - 3.5 months, seen previously for the same, without dx.  Pt additionally reports "sharp left foot pain with a burning sensation and toe cramping" starting this week.  Pt in NAD, A&O.

## 2014-12-29 ENCOUNTER — Emergency Department (HOSPITAL_COMMUNITY)
Admission: EM | Admit: 2014-12-29 | Discharge: 2014-12-29 | Disposition: A | Discharge status: Home / Self Care | Payer: Medicaid Other | Attending: Emergency Medicine | Admitting: Emergency Medicine

## 2014-12-29 ENCOUNTER — Encounter (HOSPITAL_COMMUNITY): Payer: Self-pay | Admitting: *Deleted

## 2014-12-29 DIAGNOSIS — Z79899 Other long term (current) drug therapy: Secondary | ICD-10-CM | POA: Diagnosis not present

## 2014-12-29 DIAGNOSIS — K219 Gastro-esophageal reflux disease without esophagitis: Secondary | ICD-10-CM | POA: Diagnosis not present

## 2014-12-29 DIAGNOSIS — M549 Dorsalgia, unspecified: Secondary | ICD-10-CM

## 2014-12-29 DIAGNOSIS — E785 Hyperlipidemia, unspecified: Secondary | ICD-10-CM | POA: Diagnosis not present

## 2014-12-29 DIAGNOSIS — S29002A Unspecified injury of muscle and tendon of back wall of thorax, initial encounter: Secondary | ICD-10-CM | POA: Insufficient documentation

## 2014-12-29 DIAGNOSIS — Z872 Personal history of diseases of the skin and subcutaneous tissue: Secondary | ICD-10-CM | POA: Diagnosis not present

## 2014-12-29 DIAGNOSIS — W000XXA Fall on same level due to ice and snow, initial encounter: Secondary | ICD-10-CM | POA: Diagnosis not present

## 2014-12-29 DIAGNOSIS — E119 Type 2 diabetes mellitus without complications: Secondary | ICD-10-CM | POA: Insufficient documentation

## 2014-12-29 DIAGNOSIS — I10 Essential (primary) hypertension: Secondary | ICD-10-CM | POA: Diagnosis not present

## 2014-12-29 DIAGNOSIS — Z87891 Personal history of nicotine dependence: Secondary | ICD-10-CM | POA: Insufficient documentation

## 2014-12-29 DIAGNOSIS — Y9289 Other specified places as the place of occurrence of the external cause: Secondary | ICD-10-CM | POA: Insufficient documentation

## 2014-12-29 DIAGNOSIS — Z8669 Personal history of other diseases of the nervous system and sense organs: Secondary | ICD-10-CM | POA: Diagnosis not present

## 2014-12-29 DIAGNOSIS — Z9889 Other specified postprocedural states: Secondary | ICD-10-CM | POA: Insufficient documentation

## 2014-12-29 DIAGNOSIS — Z794 Long term (current) use of insulin: Secondary | ICD-10-CM | POA: Insufficient documentation

## 2014-12-29 DIAGNOSIS — Y998 Other external cause status: Secondary | ICD-10-CM | POA: Diagnosis not present

## 2014-12-29 DIAGNOSIS — F3131 Bipolar disorder, current episode depressed, mild: Secondary | ICD-10-CM | POA: Diagnosis not present

## 2014-12-29 DIAGNOSIS — J449 Chronic obstructive pulmonary disease, unspecified: Secondary | ICD-10-CM | POA: Insufficient documentation

## 2014-12-29 DIAGNOSIS — Z862 Personal history of diseases of the blood and blood-forming organs and certain disorders involving the immune mechanism: Secondary | ICD-10-CM | POA: Insufficient documentation

## 2014-12-29 DIAGNOSIS — M1990 Unspecified osteoarthritis, unspecified site: Secondary | ICD-10-CM | POA: Diagnosis not present

## 2014-12-29 DIAGNOSIS — W19XXXA Unspecified fall, initial encounter: Secondary | ICD-10-CM

## 2014-12-29 DIAGNOSIS — Y9389 Activity, other specified: Secondary | ICD-10-CM | POA: Insufficient documentation

## 2014-12-29 MED ORDER — IBUPROFEN 800 MG PO TABS: 800.0000 mg | ORAL_TABLET | Freq: Three times a day (TID) | ORAL | Status: DC

## 2014-12-29 MED ORDER — OXYCODONE-ACETAMINOPHEN 5-325 MG PO TABS
1.0000 | ORAL_TABLET | Freq: Once | ORAL | Status: AC
  Administered 2014-12-29: 1 via ORAL
  Filled 2014-12-29: qty 1

## 2014-12-29 MED ORDER — IBUPROFEN 200 MG PO TABS
400.0000 mg | ORAL_TABLET | Freq: Once | ORAL | Status: AC
  Administered 2014-12-29: 400 mg via ORAL
  Filled 2014-12-29: qty 2

## 2014-12-29 MED ORDER — OXYCODONE-ACETAMINOPHEN 5-325 MG PO TABS: 1.0000 | ORAL_TABLET | Freq: Four times a day (QID) | ORAL | Status: DC | PRN

## 2014-12-29 NOTE — ED Notes (Signed)
Pt. Has a history of lower back pain and was on her way to the ED to get it checked out when she slipped and fell on the ice causing the back pain to worsen.

## 2014-12-29 NOTE — ED Provider Notes (Signed)
CSN: [id number]     Arrival date & time 12/29/14  2050 History   First MD Initiated Contact with Patient 12/29/14 2116     Chief Complaint  Patient presents with  . Back Pain  . Fall     (Consider location/radiation/quality/duration/timing/severity/associated sxs/prior Treatment) HPI Comments: The patient is a 39 year old female with a past medical history of morbid obesity, bipolar, hypertension, reflux presenting emergency department chief complaint of back pain since yesterday. Patient reports mid back pain since yesterday, exacerbated by movement. She reports taking ibuprofen without full resolution of symptoms. She reports she was on her way to the emergency department to get evaluated when she fell on the ice and struck her mid back. Patient denies blow to head or loss of consciousness she reports she was able to ambulate without difficulty. NO PCP.  The history is provided by the patient. No language interpreter was used.    Past Medical History  Diagnosis Date  . Morbid obesity   . Alveolar hypoventilation   . Asthma   . GERD (gastroesophageal reflux disease)   . Rectal fissure   . Hypertension   . Costochondritis   . Cellulitis 08/2010-08/2011  . Depression   . Bipolar 2 disorder   . Obstructive sleep apnea   . Chest pain     NO CAD by cath 7/61/95, nl LV systolic fxn  . Diabetes mellitus 2000    Type 2, Uncontrolled  . Arthritis   . HLD (hyperlipidemia)   . Anemia   . COPD (chronic obstructive pulmonary disease)    Past Surgical History  Procedure Laterality Date  . Carpal tunnel release    . Bil knee surgery    . Mass excision N/A 06/29/2013    Procedure:  WIDE LOCAL EXCISION OF POSTERIOR NECK ABSCESS;  Surgeon: Ralene Ok, MD;  Location: [place];  Service: General;  Laterality: N/A;  . Cardiac catheterization    . Left heart catheterization with coronary angiogram N/A 07/27/2012    Procedure: LEFT HEART CATHETERIZATION WITH CORONARY ANGIOGRAM;  Surgeon:  Sherren Mocha, MD;  Location: [facility] CATH LAB;  Service: Cardiovascular;  Laterality: N/A;   Family History  Problem Relation Age of Onset  . Diabetes Mother   . Hyperlipidemia Mother   . Depression Mother   . Heart attack Paternal Uncle   . Heart disease Paternal Grandmother   . Heart attack Paternal Grandmother   . Heart attack Paternal Grandfather   . Heart disease Paternal Grandfather    History  Substance Use Topics  . Smoking status: Former Smoker -- 0.30 packs/day for .3 years    Types: Cigarettes    Quit date: 12/06/1993  . Smokeless tobacco: Never Used  . Alcohol Use: No   OB History    No data available     Review of Systems  Constitutional: Negative for fever and chills.  Gastrointestinal: Negative for abdominal pain.  Genitourinary: Negative for difficulty urinating.  Musculoskeletal: Positive for back pain. Negative for gait problem and neck stiffness.  Skin: Negative for color change and wound.  Neurological: Negative for syncope, light-headedness and headaches.      Allergies  Kiwi extract and Nubain  Home Medications   Prior to Admission medications   Medication Sig Start Date End Date Taking? Authorizing Provider  ACCU-CHEK FASTCLIX LANCETS MISC Use to check blood sugar 2 times per day. 10/07/14   [name], MD  albuterol (PROVENTIL HFA;VENTOLIN HFA) 108 (90 BASE) MCG/ACT inhaler Inhale 1 puff into the lungs every  6 (six) hours as needed for wheezing or shortness of breath. 06/11/14   [name], MD  atorvastatin (LIPITOR) 20 MG tablet Take 20 mg by mouth daily.    Historical Provider, MD  BYETTA 5 MCG PEN 5 MCG/0.02ML SOPN injection INJECT 0.02 MLS (5 MCG TOTAL) SUBCUTANEOUSLY TWICE DAILY WITH A MEAL 10/07/14   [name], MD  carvedilol (COREG) 12.5 MG tablet Take 12.5 mg by mouth 2 (two) times daily with a meal.    Historical Provider, MD  cetirizine (ZYRTEC) 10 MG tablet Take 10 mg by mouth daily as needed for allergies.    Historical  Provider, MD  clotrimazole (LOTRIMIN) 1 % cream Apply 1 application topically daily as needed (to skin rash/ yeast).    Historical Provider, MD  fluticasone (FLONASE) 50 MCG/ACT nasal spray Place 2 sprays into both nostrils daily. 05/21/14   Melony Overly, MD  gabapentin (NEURONTIN) 300 MG capsule Take 1 capsule (300 mg total) by mouth 3 (three) times daily. 12/03/14   [name], MD  HYDROcodone-acetaminophen (NORCO) 10-325 MG per tablet Take 1 tablet by mouth 3 (three) times daily as needed for moderate pain. Do not fill before 01/11/2015 12/18/14   [name], MD  insulin aspart (NOVOLOG FLEXPEN) 100 UNIT/ML FlexPen INJECT 90 UNITS INTO THE SKIN THREE TIMES A DAY WITH MEALS. 11/19/14   [name], MD  Insulin Glargine (LANTUS SOLOSTAR) 100 UNIT/ML Solostar Pen Inject 150 units twice daily. 12/12/14   [name], MD  lisinopril (PRINIVIL,ZESTRIL) 10 MG tablet Take 1 tablet (10 mg total) by mouth daily. 03/12/14   [name], MD  NITROSTAT 0.4 MG SL tablet DISSOLVE 1 TABLET UNDER THE TONGUE EVERY 5 MINUTES AS NEEDED FOR CHEST PAIN. 11/13/14   [name], MD  nortriptyline (PAMELOR) 25 MG capsule Take 1 capsule (25 mg total) by mouth at bedtime. 12/17/14   Bryan R Hess, DO  pantoprazole (PROTONIX) 40 MG tablet Take 40 mg by mouth daily.    Historical Provider, MD  SYMBICORT 160-4.5 MCG/ACT inhaler INHALE 2 PUFFS INTO LUNGS TWICE DAILY 10/18/14   [name], MD  topiramate (TOPAMAX) 25 MG tablet TAKE 1 TABLET BY MOUTH EVERY NIGHT AT BEDTIME 10/14/14   [name], MD  traMADol (ULTRAM) 50 MG tablet TAKE 1 TABLET BY MOUTH EVERY DAY AS NEEDED FOR MODERATE PAIN 11/11/14   [name], MD   BP 139/90 mmHg  Pulse 109  Temp(Src) 98 F (36.7 C) (Oral)  Resp 16  Ht 5\' 2"  (1.575 m)  Wt 320 lb (145.151 kg)  BMI 58.51 kg/m2  SpO2 95%  LMP 12/18/2014 (Exact Date) Physical Exam  Constitutional: She is oriented to person, place, and time. She appears  well-developed and well-nourished. No distress.  Morbidly obese female.  HENT:  Head: Normocephalic and atraumatic.  Neck: Neck supple.  Pulmonary/Chest: Effort normal. No respiratory distress.  Musculoskeletal: Normal range of motion.       Thoracic back: She exhibits tenderness. She exhibits normal range of motion, no swelling, no edema and no deformity.       Back:  No midline C-spine, or L-spine tenderness with no step-offs, crepitus, or deformities noted.  Mild T-spine midline tenderness with no step-offs, crepitus, or deformities noted. Increase in discomfort to the paravertebral soft tissues. No discomfort with palpation of the sacrum and coccyx   Neurological: She is alert and oriented to person, place, and time.  Skin: Skin is warm and dry. She is  not diaphoretic.  Psychiatric: She has a normal mood and affect. Her behavior is normal.  Nursing note and vitals reviewed.   ED Course  Procedures (including critical care time) Labs Review Labs Reviewed - No data to display  Imaging Review No results found.   EKG Interpretation None      MDM   Final diagnoses:  Mid back pain  Fall, initial encounter   Patient with mid back pain, and recent fall mild midline tenderness without step-off or crepitus normal range of motion. Plan to treat with anti-inflammatories, narcotics, follow-up as out-pt. Meds given in ED:  Medications  ibuprofen (ADVIL,MOTRIN) tablet 400 mg (400 mg Oral Given 12/29/14 2106)  oxyCODONE-acetaminophen (PERCOCET/ROXICET) 5-325 MG per tablet 1 tablet (1 tablet Oral Given 12/29/14 2152)    Discharge Medication List as of 12/29/2014  9:41 PM    START taking these medications   Details  ibuprofen (ADVIL,MOTRIN) 800 MG tablet Take 1 tablet (800 mg total) by mouth 3 (three) times daily with meals., Starting 12/29/2014, Until Discontinued, Print    oxyCODONE-acetaminophen (PERCOCET/ROXICET) 5-325 MG per tablet Take 1 tablet by mouth every 6 (six) hours as  needed for moderate pain or severe pain., Starting 12/29/2014, Until Discontinued, Print           [name], PA-C 12/29/14 2314  Ernestina Patches, MD 12/30/14 2148

## 2014-12-29 NOTE — Discharge Instructions (Signed)
Call for a follow up appointment with a Family or Primary Care Provider.  Return if Symptoms worsen.   Take medication as prescribed.  Ice your back 3-4 times a day.

## 2014-12-31 ENCOUNTER — Ambulatory Visit: Payer: Medicaid Other | Admitting: Endocrinology

## 2015-01-01 ENCOUNTER — Telehealth: Payer: Self-pay | Admitting: Endocrinology

## 2015-01-01 NOTE — Telephone Encounter (Signed)
Patient no showed today's appt. Please advise on how to follow up. °A. No follow up necessary. °B. Follow up urgent. Contact patient immediately. °C. Follow up necessary. Contact patient and schedule visit in ___ days. °D. Follow up advised. Contact patient and schedule visit in ____weeks. ° °

## 2015-01-01 NOTE — Telephone Encounter (Signed)
Pt coming in for appointment on 01/08/2015 at 11 am.

## 2015-01-01 NOTE — Telephone Encounter (Signed)
Follow up advised. Contact patient and schedule visit in 2 weeks. 

## 2015-01-03 ENCOUNTER — Other Ambulatory Visit: Payer: Self-pay | Admitting: Family Medicine

## 2015-01-07 ENCOUNTER — Other Ambulatory Visit: Payer: Self-pay | Admitting: Family Medicine

## 2015-01-08 ENCOUNTER — Ambulatory Visit: Payer: Medicaid Other | Admitting: Endocrinology

## 2015-01-08 ENCOUNTER — Encounter: Payer: Self-pay | Admitting: Neurology

## 2015-01-08 ENCOUNTER — Ambulatory Visit: Payer: Medicaid Other | Admitting: Neurology

## 2015-01-08 ENCOUNTER — Ambulatory Visit (INDEPENDENT_AMBULATORY_CARE_PROVIDER_SITE_OTHER): Payer: Medicaid Other | Admitting: Neurology

## 2015-01-08 VITALS — BP 124/80 | HR 80 | Temp 97.7°F | Resp 18 | Ht 62.0 in | Wt 327.1 lb

## 2015-01-08 DIAGNOSIS — G580 Intercostal neuropathy: Secondary | ICD-10-CM

## 2015-01-08 DIAGNOSIS — R519 Headache, unspecified: Secondary | ICD-10-CM

## 2015-01-08 DIAGNOSIS — IMO0002 Reserved for concepts with insufficient information to code with codable children: Secondary | ICD-10-CM

## 2015-01-08 DIAGNOSIS — E1165 Type 2 diabetes mellitus with hyperglycemia: Secondary | ICD-10-CM

## 2015-01-08 DIAGNOSIS — R51 Headache: Secondary | ICD-10-CM

## 2015-01-08 DIAGNOSIS — M792 Neuralgia and neuritis, unspecified: Secondary | ICD-10-CM

## 2015-01-08 MED ORDER — GABAPENTIN 300 MG PO CAPS: 600.0000 mg | ORAL_CAPSULE | Freq: Three times a day (TID) | ORAL | Status: DC

## 2015-01-08 NOTE — Progress Notes (Addendum)
NEUROLOGY FOLLOW UP OFFICE NOTE  [name] [id number]  HISTORY OF PRESENT ILLNESS: [name] is a 39 year old right-handed woman with history of poorly controlled type II diabetes mellitus, hypertension, hyperlipidemia, OSA, asthma, GERD, morbid obesity, cellulitis, chronic pain who follows up but for a new issue, left sided thoracic pain.  UPDATE: I had recommended an LP to assess opening pressure, however she didn't want to have it.  The headaches and vision problems have resolved.  She is here today because she has been experiencing left sided thoracic pain.  It started about a month ago.  She notes stabbing pain from the left side of there thorac to the mid-line of her abdomen just below the breast.  There is also an overlying tingling sensation on the skin.  She denies back pain.  It was believed that she may have had shingles but she did not have a rash.  She was never started on acyclovir.  She was started on gabapentin 300mg  three times daily, which really didn't help.  Voltaren gel is ineffective.  She had a similar episode about a year ago, which resolved after a couple of weeks.  Hgb A1c from 11/15/14 was 13.8  HISTORY: Onset:  In August, she began experiencing episodes of recurrent monocular vision loss in her right eye.  It was associated with mild right frontal headache.  There was no associated nausea.  She was reportedly evaluated by an ophthalmologist, who recommended neurological consultation.  She was admitted to [facility] on 07/12/14 for further evaluation.  CTA of the head and neck revealed no significant intracranial arterial stenosis or right carotid artery stenosis.  There were some calcified atheromatous changes near the left ICA ophthalmic origin, of no clinical significance.  MRI of the brain and orbits with and without contrast was unremarkable.  Sed Rate was 38.  WBC was 9.1, HGB 13.9, HCT 41, PLT 288, Na 137, K 4.1, BUN 8, and Cr 0.39.  Plasma glucose levels have  been ranging from 270s to 460s.  HGB A1c from 05/13/14 was over 14.  During her hospitalization, symptoms resolved.  It was thought that she likely had migraine.  Within a day or two after hospital discharge, she began experiencing severe left sided throbbing headache.  She presented to the ED on 07/20/14 with  left sided vision loss, which resolved.  She returned the following day with episode of bilateral vision loss and sensation of left-sided heaviness.  She was treated with a migraine cocktail and symptoms resolved.  LP was discussed in the ED but patient refused.  On 07/26/14, she had another episode of bilateral vision loss, lasting 60 to 90 minutes and then left monocular vision loss the following day, lasting 45 minutes.  She developed recurrence of headache.  She denies numbness and tingling or pulsatile tinnitus.  She was taking ibuprofen and Fioricet frequently.    PAST MEDICAL HISTORY: Past Medical History  Diagnosis Date  . Morbid obesity   . Alveolar hypoventilation   . Asthma   . GERD (gastroesophageal reflux disease)   . Rectal fissure   . Hypertension   . Costochondritis   . Cellulitis 08/2010-08/2011  . Depression   . Bipolar 2 disorder   . Obstructive sleep apnea   . Chest pain     NO CAD by cath 07/15/16, nl LV systolic fxn  . Diabetes mellitus 2000    Type 2, Uncontrolled  . Arthritis   . HLD (hyperlipidemia)   . Anemia   .  COPD (chronic obstructive pulmonary disease)     MEDICATIONS: Current Outpatient Prescriptions on File Prior to Visit  Medication Sig Dispense Refill  . ACCU-CHEK FASTCLIX LANCETS MISC Use to check blood sugar 2 times per day. 100 each 2  . albuterol (PROVENTIL HFA;VENTOLIN HFA) 108 (90 BASE) MCG/ACT inhaler Inhale 1 puff into the lungs every 6 (six) hours as needed for wheezing or shortness of breath. 18 g 11  . atorvastatin (LIPITOR) 20 MG tablet Take 20 mg by mouth daily.    Marland Kitchen BYETTA 5 MCG PEN 5 MCG/0.02ML SOPN injection INJECT 0.02 MLS (5 MCG  TOTAL) SUBCUTANEOUSLY TWICE DAILY WITH A MEAL 2 pen 2  . carvedilol (COREG) 12.5 MG tablet Take 12.5 mg by mouth 2 (two) times daily with a meal.    . cetirizine (ZYRTEC) 10 MG tablet Take 10 mg by mouth daily as needed for allergies.    . clotrimazole (LOTRIMIN) 1 % cream APPLY TOPICALLY 2 TIMES A DAY AS NEEDED FOR SKIN YEAST 60 g 0  . fluticasone (FLONASE) 50 MCG/ACT nasal spray Place 2 sprays into both nostrils daily. 16 g 6  . HYDROcodone-acetaminophen (NORCO) 10-325 MG per tablet Take 1 tablet by mouth 3 (three) times daily as needed for moderate pain. Do not fill before 01/11/2015 90 tablet 0  . ibuprofen (ADVIL,MOTRIN) 800 MG tablet Take 1 tablet (800 mg total) by mouth 3 (three) times daily with meals. 21 tablet 0  . insulin aspart (NOVOLOG FLEXPEN) 100 UNIT/ML FlexPen INJECT 90 UNITS INTO THE SKIN THREE TIMES A DAY WITH MEALS. 30 pen 2  . Insulin Glargine (LANTUS SOLOSTAR) 100 UNIT/ML Solostar Pen Inject 150 units twice daily. 30 pen 2  . lisinopril (PRINIVIL,ZESTRIL) 10 MG tablet Take 1 tablet (10 mg total) by mouth daily. 30 tablet 5  . NITROSTAT 0.4 MG SL tablet DISSOLVE 1 TABLET UNDER THE TONGUE EVERY 5 MINUTES AS NEEDED FOR CHEST PAIN. 30 tablet 1  . nortriptyline (PAMELOR) 25 MG capsule Take 1 capsule (25 mg total) by mouth at bedtime. 30 capsule 5  . oxyCODONE-acetaminophen (PERCOCET/ROXICET) 5-325 MG per tablet Take 1 tablet by mouth every 6 (six) hours as needed for moderate pain or severe pain. 10 tablet 0  . pantoprazole (PROTONIX) 40 MG tablet Take 40 mg by mouth daily.    . SYMBICORT 160-4.5 MCG/ACT inhaler INHALE 2 PUFFS INTO LUNGS TWICE DAILY 10.2 g 4  . topiramate (TOPAMAX) 25 MG tablet TAKE 1 TABLET BY MOUTH EVERY NIGHT AT BEDTIME 30 tablet 3  . traMADol (ULTRAM) 50 MG tablet TAKE 1 TABLET BY MOUTH EVERY DAY AS NEEDED FOR MODERATE PAIN 15 tablet 2   No current facility-administered medications on file prior to visit.    ALLERGIES: Allergies  Allergen Reactions  . Kiwi  Extract Anaphylaxis  . Nubain [Nalbuphine Hcl] Other (See Comments)    Nervous...makes her feel like something is crawling on her.    FAMILY HISTORY: Family History  Problem Relation Age of Onset  . Diabetes Mother   . Hyperlipidemia Mother   . Depression Mother   . Heart attack Paternal Uncle   . Heart disease Paternal Grandmother   . Heart attack Paternal Grandmother   . Heart attack Paternal Grandfather   . Heart disease Paternal Grandfather     SOCIAL HISTORY: History   Social History  . Marital Status: Married    Spouse Name: N/A    Number of Children: N/A  . Years of Education: N/A   Occupational History  .  Not on file.   Social History Main Topics  . Smoking status: Former Smoker -- 0.30 packs/day for .3 years    Types: Cigarettes    Quit date: 12/06/1993  . Smokeless tobacco: Never Used  . Alcohol Use: No  . Drug Use: No  . Sexual Activity:    Partners: Male   Other Topics Concern  . Not on file   Social History Narrative   Lives in [place] with her fiance and 37 yr old dtr.    REVIEW OF SYSTEMS: Constitutional: No fevers, chills, or sweats, no generalized fatigue, change in appetite Eyes: No visual changes, double vision, eye pain Ear, nose and throat: No hearing loss, ear pain, nasal congestion, sore throat Cardiovascular: No chest pain, palpitations Respiratory:  No shortness of breath at rest or with exertion, wheezes GastrointestinaI: No nausea, vomiting, diarrhea, abdominal pain, fecal incontinence Genitourinary:  No dysuria, urinary retention or frequency Musculoskeletal:  No neck pain, back pain Integumentary: No rash, pruritus, skin lesions Neurological: as above Psychiatric: No depression, insomnia, anxiety Endocrine: No palpitations, fatigue, diaphoresis, mood swings, change in appetite, change in weight, increased thirst Hematologic/Lymphatic:  No anemia, purpura, petechiae. Allergic/Immunologic: no itchy/runny eyes, nasal congestion, recent  allergic reactions, rashes  PHYSICAL EXAM: Filed Vitals:   01/08/15 0806  BP: 124/80  Pulse: 80  Temp: 97.7 F (36.5 C)  Resp: 18   General: No acute distress Head:  Normocephalic/atraumatic Eyes:  Fundoscopic exam unremarkable without vessel changes, exudates, hemorrhages or papilledema. Neck: supple, no paraspinal tenderness, full range of motion Heart:  Regular rate and rhythm Lungs:  Clear to auscultation bilaterally Back: No paraspinal tenderness Neurological Exam: alert and oriented to person, place, and time. Attention span and concentration intact, recent and remote memory intact, fund of knowledge intact.  Speech fluent and not dysarthric, language intact.  CN II-XII intact. Fundoscopic exam unremarkable without vessel changes, exudates, hemorrhages or papilledema.  Bulk and tone normal, muscle strength 5/5 throughout.  Sensation to pinprick reduced from left T5-T7 dermatomes on the left from front to side.  Deep tendon reflexes 1+ throughout, toes downgoing.  Finger to nose and heel to shin testing intact.  Gait normal   IMPRESSION: Left sided thoracic intercostal neuralgia.  It could still be herpetic neuralgia (without rash) or a thoracic radiculopathy related to uncontrolled diabetes. Morbid obesity Uncontrolled type II diabetes mellitus Headache, stable  PLAN: 1.  Increase gabapentin to 600mg  three times daily 2.  Optimize diabetes control 3.  Weight loss 4.  Call in one week and we can increase dose if needed. 5.  Follow up in 3 months.  Metta Clines, DO  CC: [name], MD

## 2015-01-08 NOTE — Telephone Encounter (Signed)
Covering for Dr [name]. Please let pt know I am refusing request for tramadol given last rx 11/11/2014 #15 with 2 refills was specified to be filled q30 days which would mean on 01/12/15 she is due for 2nd refill.  Hilton Sinclair, MD

## 2015-01-08 NOTE — Progress Notes (Deleted)
NEUROLOGY FOLLOW UP OFFICE NOTE  [name] [id number]  HISTORY OF PRESENT ILLNESS: ***.  Records and images were personally reviewed where available.  ***.  PAST MEDICAL HISTORY: Past Medical History  Diagnosis Date  . Morbid obesity   . Alveolar hypoventilation   . Asthma   . GERD (gastroesophageal reflux disease)   . Rectal fissure   . Hypertension   . Costochondritis   . Cellulitis 08/2010-08/2011  . Depression   . Bipolar 2 disorder   . Obstructive sleep apnea   . Chest pain     NO CAD by cath 5/63/14, nl LV systolic fxn  . Diabetes mellitus 2000    Type 2, Uncontrolled  . Arthritis   . HLD (hyperlipidemia)   . Anemia   . COPD (chronic obstructive pulmonary disease)     MEDICATIONS: Current Outpatient Prescriptions on File Prior to Visit  Medication Sig Dispense Refill  . ACCU-CHEK FASTCLIX LANCETS MISC Use to check blood sugar 2 times per day. 100 each 2  . albuterol (PROVENTIL HFA;VENTOLIN HFA) 108 (90 BASE) MCG/ACT inhaler Inhale 1 puff into the lungs every 6 (six) hours as needed for wheezing or shortness of breath. 18 g 11  . atorvastatin (LIPITOR) 20 MG tablet Take 20 mg by mouth daily.    Marland Kitchen BYETTA 5 MCG PEN 5 MCG/0.02ML SOPN injection INJECT 0.02 MLS (5 MCG TOTAL) SUBCUTANEOUSLY TWICE DAILY WITH A MEAL 2 pen 2  . carvedilol (COREG) 12.5 MG tablet Take 12.5 mg by mouth 2 (two) times daily with a meal.    . cetirizine (ZYRTEC) 10 MG tablet Take 10 mg by mouth daily as needed for allergies.    . clotrimazole (LOTRIMIN) 1 % cream APPLY TOPICALLY 2 TIMES A DAY AS NEEDED FOR SKIN YEAST 60 g 0  . fluticasone (FLONASE) 50 MCG/ACT nasal spray Place 2 sprays into both nostrils daily. 16 g 6  . HYDROcodone-acetaminophen (NORCO) 10-325 MG per tablet Take 1 tablet by mouth 3 (three) times daily as needed for moderate pain. Do not fill before 01/11/2015 90 tablet 0  . ibuprofen (ADVIL,MOTRIN) 800 MG tablet Take 1 tablet (800 mg total) by mouth 3 (three) times daily with  meals. 21 tablet 0  . insulin aspart (NOVOLOG FLEXPEN) 100 UNIT/ML FlexPen INJECT 90 UNITS INTO THE SKIN THREE TIMES A DAY WITH MEALS. 30 pen 2  . Insulin Glargine (LANTUS SOLOSTAR) 100 UNIT/ML Solostar Pen Inject 150 units twice daily. 30 pen 2  . lisinopril (PRINIVIL,ZESTRIL) 10 MG tablet Take 1 tablet (10 mg total) by mouth daily. 30 tablet 5  . NITROSTAT 0.4 MG SL tablet DISSOLVE 1 TABLET UNDER THE TONGUE EVERY 5 MINUTES AS NEEDED FOR CHEST PAIN. 30 tablet 1  . nortriptyline (PAMELOR) 25 MG capsule Take 1 capsule (25 mg total) by mouth at bedtime. 30 capsule 5  . oxyCODONE-acetaminophen (PERCOCET/ROXICET) 5-325 MG per tablet Take 1 tablet by mouth every 6 (six) hours as needed for moderate pain or severe pain. 10 tablet 0  . pantoprazole (PROTONIX) 40 MG tablet Take 40 mg by mouth daily.    . SYMBICORT 160-4.5 MCG/ACT inhaler INHALE 2 PUFFS INTO LUNGS TWICE DAILY 10.2 g 4  . topiramate (TOPAMAX) 25 MG tablet TAKE 1 TABLET BY MOUTH EVERY NIGHT AT BEDTIME 30 tablet 3  . traMADol (ULTRAM) 50 MG tablet TAKE 1 TABLET BY MOUTH EVERY DAY AS NEEDED FOR MODERATE PAIN 15 tablet 2   No current facility-administered medications on file prior to visit.  ALLERGIES: Allergies  Allergen Reactions  . Kiwi Extract Anaphylaxis  . Nubain [Nalbuphine Hcl] Other (See Comments)    Nervous...makes her feel like something is crawling on her.    FAMILY HISTORY: Family History  Problem Relation Age of Onset  . Diabetes Mother   . Hyperlipidemia Mother   . Depression Mother   . Heart attack Paternal Uncle   . Heart disease Paternal Grandmother   . Heart attack Paternal Grandmother   . Heart attack Paternal Grandfather   . Heart disease Paternal Grandfather     SOCIAL HISTORY: History   Social History  . Marital Status: Married    Spouse Name: N/A    Number of Children: N/A  . Years of Education: N/A   Occupational History  . Not on file.   Social History Main Topics  . Smoking status:  Former Smoker -- 0.30 packs/day for .3 years    Types: Cigarettes    Quit date: 12/06/1993  . Smokeless tobacco: Never Used  . Alcohol Use: No  . Drug Use: No  . Sexual Activity:    Partners: Male   Other Topics Concern  . Not on file   Social History Narrative   Lives in [place] with her fiance and 37 yr old dtr.    REVIEW OF SYSTEMS: Constitutional: No fevers, chills, or sweats, no generalized fatigue, change in appetite Eyes: No visual changes, double vision, eye pain Ear, nose and throat: No hearing loss, ear pain, nasal congestion, sore throat Cardiovascular: No chest pain, palpitations Respiratory:  No shortness of breath at rest or with exertion, wheezes GastrointestinaI: No nausea, vomiting, diarrhea, abdominal pain, fecal incontinence Genitourinary:  No dysuria, urinary retention or frequency Musculoskeletal:  No neck pain, back pain Integumentary: No rash, pruritus, skin lesions Neurological: as above Psychiatric: No depression, insomnia, anxiety Endocrine: No palpitations, fatigue, diaphoresis, mood swings, change in appetite, change in weight, increased thirst Hematologic/Lymphatic:  No anemia, purpura, petechiae. Allergic/Immunologic: no itchy/runny eyes, nasal congestion, recent allergic reactions, rashes  PHYSICAL EXAM: Filed Vitals:   01/08/15 0806  BP: 124/80  Pulse: 80  Temp: 97.7 F (36.5 C)  Resp: 18   General: No acute distress Head:  Normocephalic/atraumatic Eyes:  Fundoscopic exam unremarkable without vessel changes, exudates, hemorrhages or papilledema. Neck: supple, no paraspinal tenderness, full range of motion Heart:  Regular rate and rhythm Lungs:  Clear to auscultation bilaterally Back: No paraspinal tenderness Neurological Exam: alert and oriented to person, place, and time. Attention span and concentration intact, recent and remote memory intact, fund of knowledge intact.  Speech fluent and not dysarthric, language intact.  CN II-XII intact.  Fundoscopic exam unremarkable without vessel changes, exudates, hemorrhages or papilledema.  Bulk and tone normal, muscle strength 5/5 throughout.  Sensation to light touch, temperature and vibration intact.  Deep tendon reflexes 2+ throughout, toes downgoing.  Finger to nose and heel to shin testing intact.  Gait normal, Romberg negative.  IMPRESSION: ***  PLAN: ***  Metta Clines, DO  CC: ***

## 2015-01-08 NOTE — Patient Instructions (Signed)
The pain could still be from shingles without rash or related to diabetes 1.  Will increase gabapentin 600mg  (3 pills) three times daily 2.  Work on improving diabetes 3.  Work on weight loss 4.  Call in one week and we can increase dose if needed. 5.  Follow up in 3 months.

## 2015-01-10 ENCOUNTER — Ambulatory Visit: Payer: Medicaid Other | Admitting: Endocrinology

## 2015-01-12 IMAGING — CT CT ABD-PELV W/O CM
2 of 4 series · 17 of 46 positions shown, 19 images · non-contrast
Comparison: 12/20/2012

CLINICAL DATA: Left-sided flank pain.  , hematuria

EXAM:
CT ABDOMEN AND PELVIS WITHOUT CONTRAST
TECHNIQUE: Multidetector CT imaging of the abdomen and pelvis was performed
following the standard protocol without intravenous contrast.

[Series 2: abd/ pelvis 5.0 i30f 1 · axial · 0.92mm/px · z∈[+729,+1184]mm · 14 of 99 slices shown, 16 images]
[im 4/99  soft-tissue]
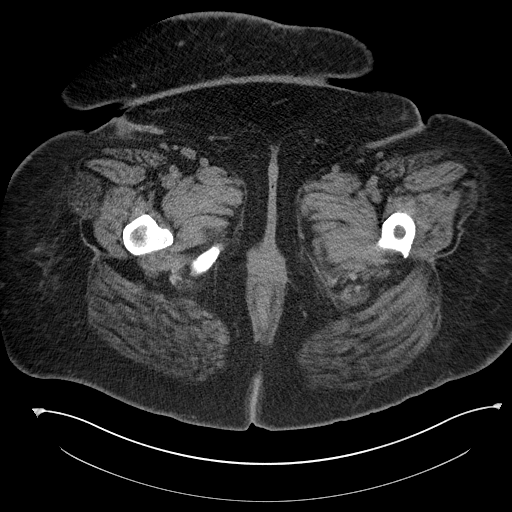
[im 4/99  bone]
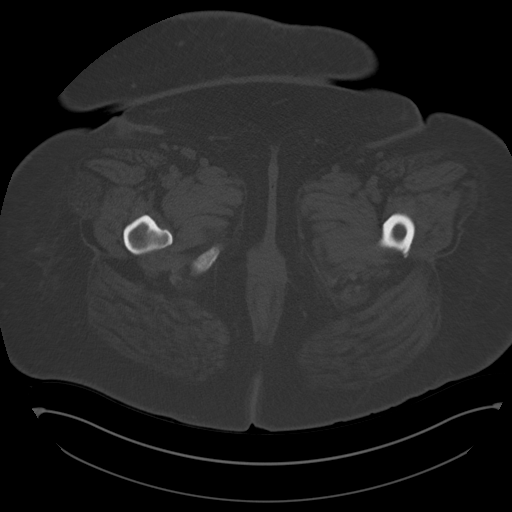
[im 12/99  soft-tissue]
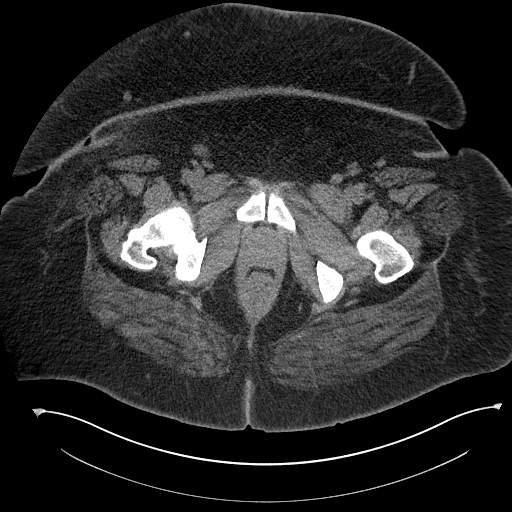
[im 20/99  soft-tissue]
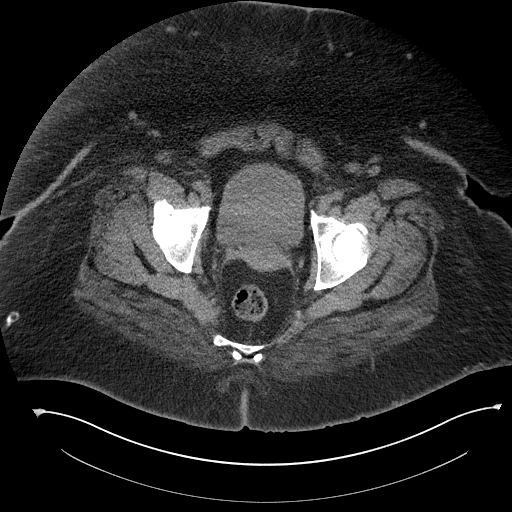
[im 28/99  soft-tissue]
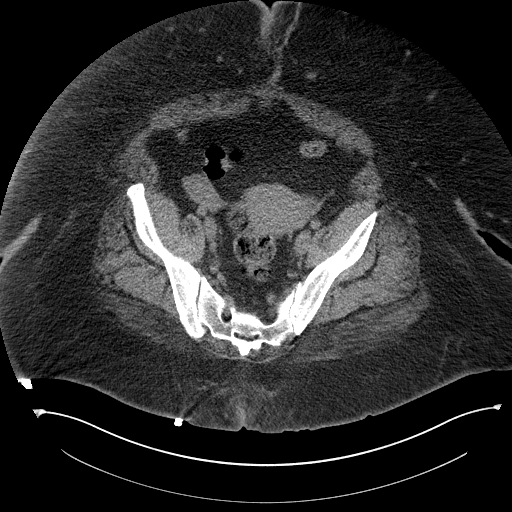
[im 32/99  soft-tissue]
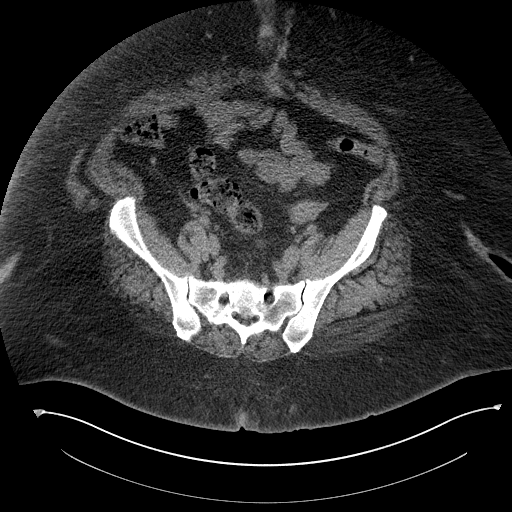
[im 40/99  soft-tissue]
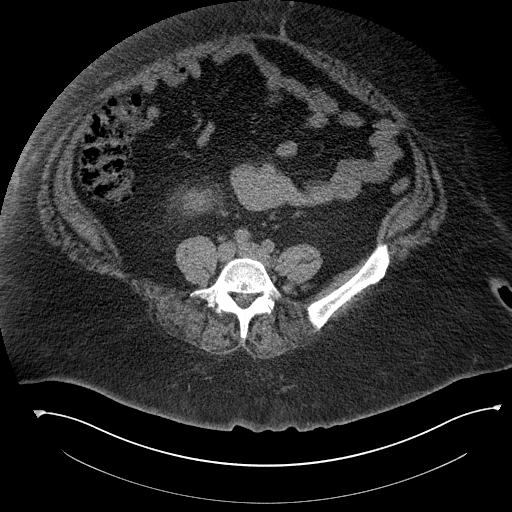
[im 48/99  soft-tissue]
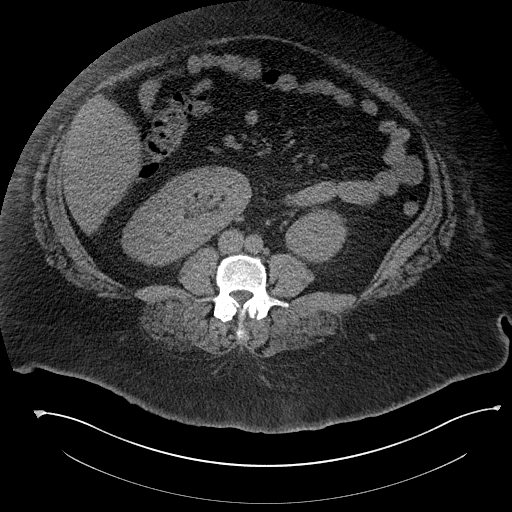
[im 51/99  soft-tissue]
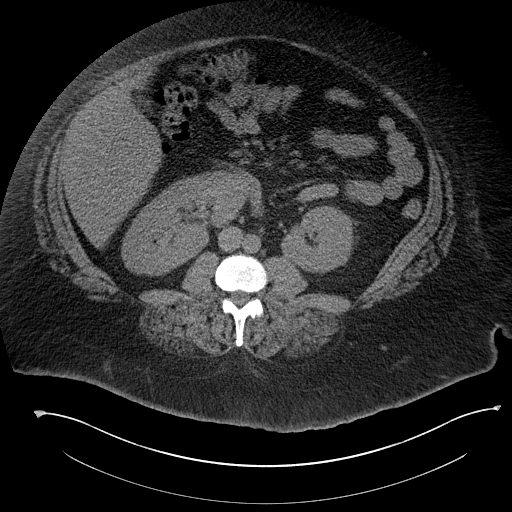
[im 59/99  soft-tissue]
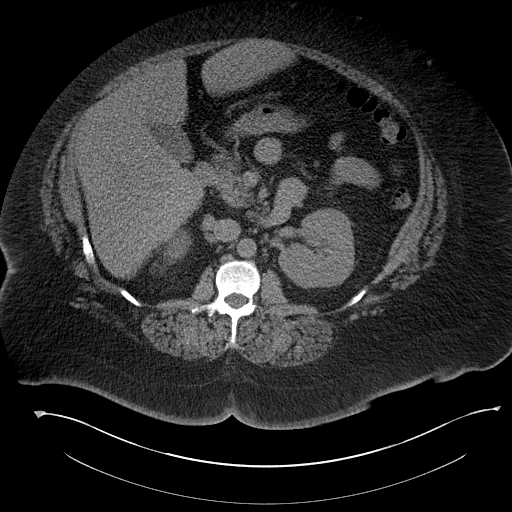
[im 59/99  bone]
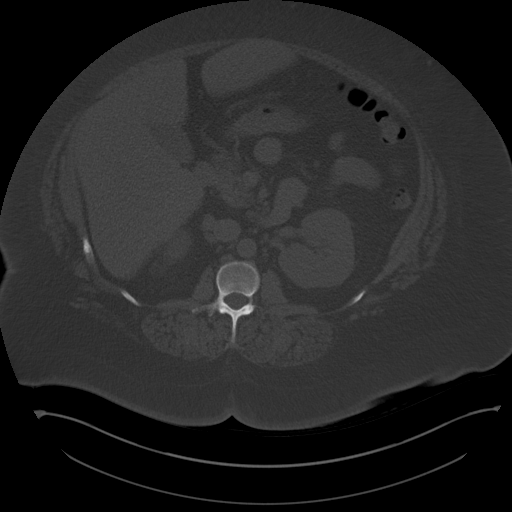
[im 67/99  soft-tissue]
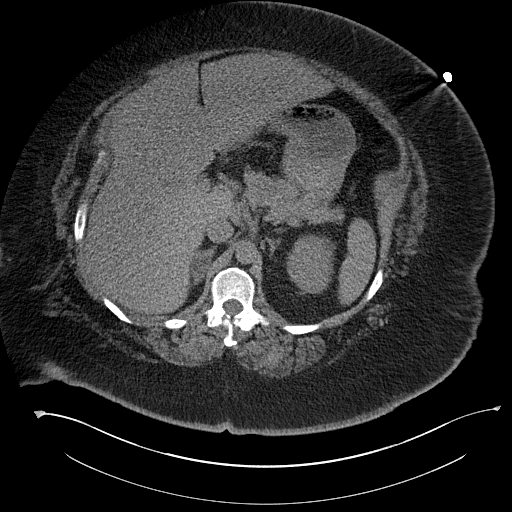
[im 75/99  soft-tissue]
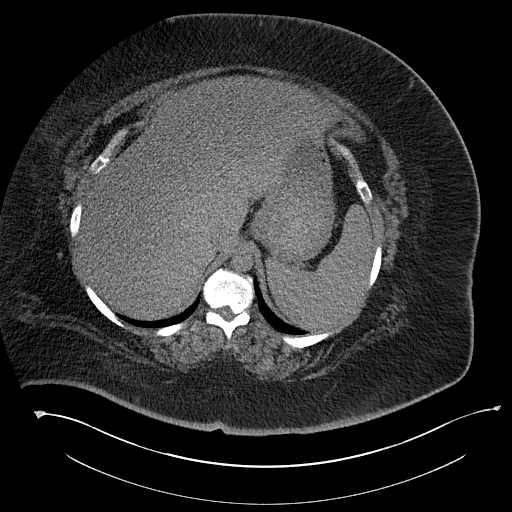
[im 79/99  soft-tissue]
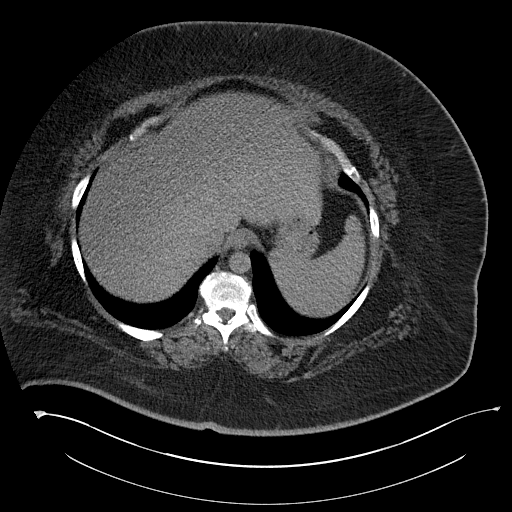
[im 87/99  soft-tissue]
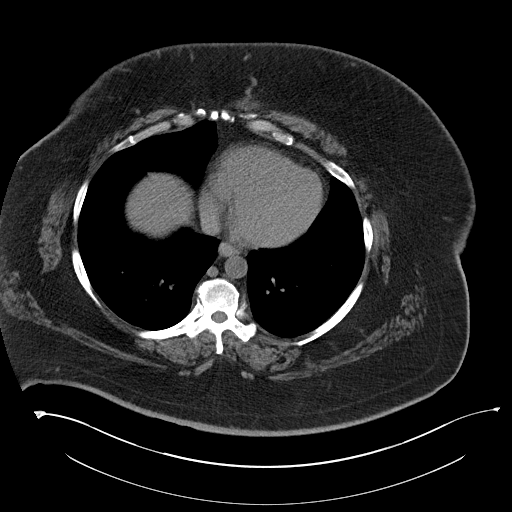
[im 95/99  soft-tissue]
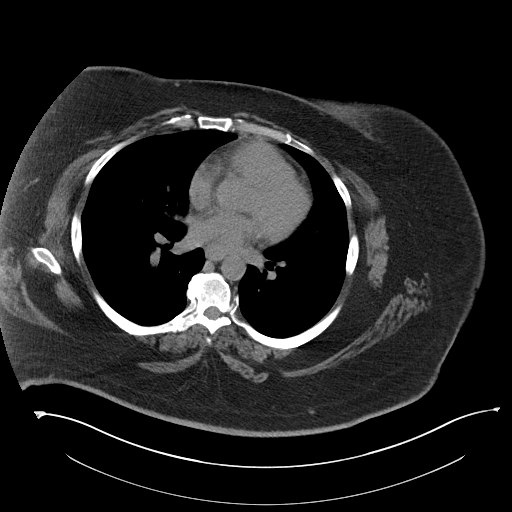

[Series 602: coronal · coronal · 0.96mm/px · 3 of 110 slices shown]
[im 37/110  soft-tissue]
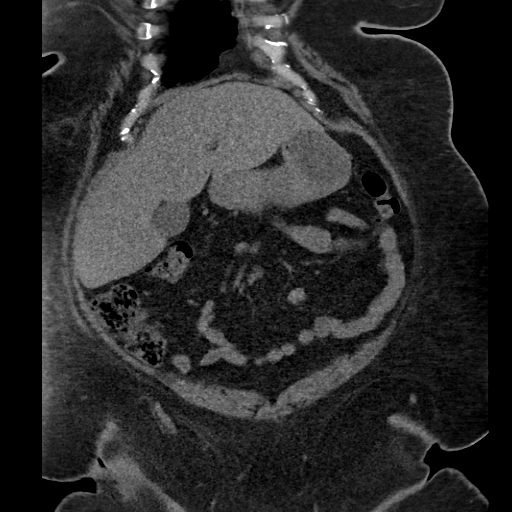
[im 49/110  soft-tissue]
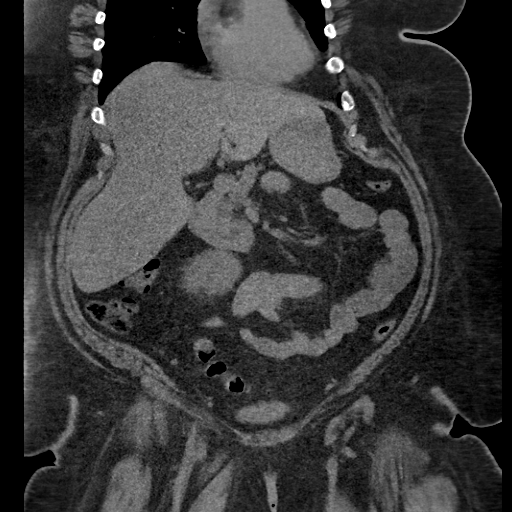
[im 61/110  soft-tissue]
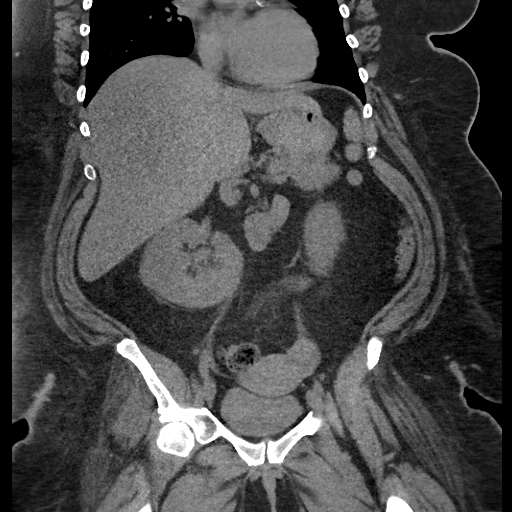

[17 of 46 positions shown; findings below may reference images not displayed]

FINDINGS: The lung bases are unremarkable. Noncontrast evaluation mediastinum
is unremarkable.

There is diffuse areas of low attenuation are appreciated within the
liver parenchyma.

There is no evidence of hydronephrosis, hydroureter,
nephrolithiasis, no ureterolithiasis within the kidneys. Incomplete
rotation of the right kidney is again appreciated and stable. .

The spleen, the spleen, left adrenal, pancreas are unremarkable. A
stable 2.4 cm left adrenal nodule identified.

There is no evidence of bowel obstruction, enteritis, colitis,
diverticulitis, nor secondary signs reflecting appendicitis within
the limitations of a noncontrast CT. The appendix is identified and
appears unremarkable. There is no evidence of an abdominal aortic
aneurysm. There is no evidence of abdominal pelvic free fluid,
loculated fluid collections, masses or adenopathy within the
limitations of a noncontrast CT.

Evaluation of the pelvis demonstrates minimal diverticulosis in the
sigmoid colon and otherwise unremarkable. No aggressive appearing
osseous lesions identified.
IMPRESSION: No CT evidence of acute, obstructive, or inflammatory abnormalities.
Areas of fatty infiltration within the liver parenchyma.

## 2015-01-20 ENCOUNTER — Ambulatory Visit: Payer: Medicaid Other | Admitting: Family Medicine

## 2015-01-22 ENCOUNTER — Encounter (HOSPITAL_COMMUNITY): Payer: Self-pay | Admitting: *Deleted

## 2015-01-22 DIAGNOSIS — E785 Hyperlipidemia, unspecified: Secondary | ICD-10-CM | POA: Diagnosis not present

## 2015-01-22 DIAGNOSIS — M549 Dorsalgia, unspecified: Secondary | ICD-10-CM | POA: Insufficient documentation

## 2015-01-22 DIAGNOSIS — Z794 Long term (current) use of insulin: Secondary | ICD-10-CM | POA: Insufficient documentation

## 2015-01-22 DIAGNOSIS — Z3202 Encounter for pregnancy test, result negative: Secondary | ICD-10-CM | POA: Insufficient documentation

## 2015-01-22 DIAGNOSIS — M1990 Unspecified osteoarthritis, unspecified site: Secondary | ICD-10-CM | POA: Diagnosis not present

## 2015-01-22 DIAGNOSIS — Z791 Long term (current) use of non-steroidal anti-inflammatories (NSAID): Secondary | ICD-10-CM | POA: Insufficient documentation

## 2015-01-22 DIAGNOSIS — Z872 Personal history of diseases of the skin and subcutaneous tissue: Secondary | ICD-10-CM | POA: Diagnosis not present

## 2015-01-22 DIAGNOSIS — Z7951 Long term (current) use of inhaled steroids: Secondary | ICD-10-CM | POA: Insufficient documentation

## 2015-01-22 DIAGNOSIS — Z9889 Other specified postprocedural states: Secondary | ICD-10-CM | POA: Insufficient documentation

## 2015-01-22 DIAGNOSIS — Z862 Personal history of diseases of the blood and blood-forming organs and certain disorders involving the immune mechanism: Secondary | ICD-10-CM | POA: Diagnosis not present

## 2015-01-22 DIAGNOSIS — Z87891 Personal history of nicotine dependence: Secondary | ICD-10-CM | POA: Insufficient documentation

## 2015-01-22 DIAGNOSIS — J449 Chronic obstructive pulmonary disease, unspecified: Secondary | ICD-10-CM | POA: Insufficient documentation

## 2015-01-22 DIAGNOSIS — E119 Type 2 diabetes mellitus without complications: Secondary | ICD-10-CM | POA: Diagnosis not present

## 2015-01-22 DIAGNOSIS — F315 Bipolar disorder, current episode depressed, severe, with psychotic features: Secondary | ICD-10-CM | POA: Diagnosis not present

## 2015-01-22 DIAGNOSIS — G580 Intercostal neuropathy: Secondary | ICD-10-CM | POA: Diagnosis not present

## 2015-01-22 DIAGNOSIS — K219 Gastro-esophageal reflux disease without esophagitis: Secondary | ICD-10-CM | POA: Diagnosis not present

## 2015-01-22 DIAGNOSIS — G4733 Obstructive sleep apnea (adult) (pediatric): Secondary | ICD-10-CM | POA: Diagnosis not present

## 2015-01-22 DIAGNOSIS — R109 Unspecified abdominal pain: Secondary | ICD-10-CM | POA: Diagnosis present

## 2015-01-22 DIAGNOSIS — I10 Essential (primary) hypertension: Secondary | ICD-10-CM | POA: Diagnosis not present

## 2015-01-22 DIAGNOSIS — Z79899 Other long term (current) drug therapy: Secondary | ICD-10-CM | POA: Insufficient documentation

## 2015-01-22 NOTE — ED Notes (Signed)
The pt is c/o upper abd and back pain since November.  She has seen her doctor and she was given neuriontin that worked for [employer] then stopped lmp  Last month

## 2015-01-23 ENCOUNTER — Telehealth: Payer: Self-pay | Admitting: *Deleted

## 2015-01-23 ENCOUNTER — Emergency Department (HOSPITAL_COMMUNITY)
Admission: EM | Admit: 2015-01-23 | Discharge: 2015-01-23 | Disposition: A | Discharge status: Home / Self Care | Payer: Medicaid Other | Attending: Emergency Medicine | Admitting: Emergency Medicine

## 2015-01-23 DIAGNOSIS — G580 Intercostal neuropathy: Secondary | ICD-10-CM

## 2015-01-23 LAB — COMPREHENSIVE METABOLIC PANEL
ALBUMIN: 2.9 g/dL — AB (ref 3.5–5.2)
ALT: 18 U/L (ref 0–35)
AST: 18 U/L (ref 0–37)
Alkaline Phosphatase: 115 U/L (ref 39–117)
Anion gap: 12 (ref 5–15)
BILIRUBIN TOTAL: 0.5 mg/dL (ref 0.3–1.2)
BUN: 12 mg/dL (ref 6–23)
CALCIUM: 9 mg/dL (ref 8.4–10.5)
CO2: 20 mmol/L (ref 19–32)
CREATININE: 0.67 mg/dL (ref 0.50–1.10)
Chloride: 99 mmol/L (ref 96–112)
GFR calc Af Amer: >90 mL/min (ref >90)
GLUCOSE: 441 mg/dL — AB (ref 70–99)
Potassium: 3.8 mmol/L (ref 3.5–5.1)
SODIUM: 131 mmol/L — AB (ref 135–145)
Total Protein: 6.9 g/dL (ref 6.0–8.3)

## 2015-01-23 LAB — URINE MICROSCOPIC-ADD ON

## 2015-01-23 LAB — CBC WITH DIFFERENTIAL/PLATELET
BASOS PCT: 1 % (ref 0–1)
Basophils Absolute: 0.1 10*3/uL (ref 0.0–0.1)
EOS ABS: 0.1 10*3/uL (ref 0.0–0.7)
EOS PCT: 1 % (ref 0–5)
HCT: 40.9 % (ref 36.0–46.0)
Hemoglobin: 13.9 g/dL (ref 12.0–15.0)
Lymphocytes Relative: 49 % — ABNORMAL HIGH (ref 12–46)
Lymphs Abs: 5.2 10*3/uL — ABNORMAL HIGH (ref 0.7–4.0)
MCH: 29.9 pg (ref 26.0–34.0)
MCHC: 34 g/dL (ref 30.0–36.0)
MCV: 88 fL (ref 78.0–100.0)
Monocytes Absolute: 0.7 10*3/uL (ref 0.1–1.0)
Monocytes Relative: 6 % (ref 3–12)
Neutro Abs: 4.5 10*3/uL (ref 1.7–7.7)
Neutrophils Relative %: 43 % (ref 43–77)
PLATELETS: 299 10*3/uL (ref 150–400)
RBC: 4.65 MIL/uL (ref 3.87–5.11)
RDW: 12.5 % (ref 11.5–15.5)
WBC: 10.5 10*3/uL (ref 4.0–10.5)

## 2015-01-23 LAB — URINALYSIS, ROUTINE W REFLEX MICROSCOPIC
Bilirubin Urine: NEGATIVE
Hgb urine dipstick: NEGATIVE
Ketones, ur: NEGATIVE mg/dL
Leukocytes, UA: NEGATIVE
Nitrite: NEGATIVE
Protein, ur: 30 mg/dL — AB
SPECIFIC GRAVITY, URINE: 1.039 — AB (ref 1.005–1.030)
UROBILINOGEN UA: 0.2 mg/dL (ref 0.0–1.0)
pH: 5 (ref 5.0–8.0)

## 2015-01-23 LAB — PREGNANCY, URINE: Preg Test, Ur: NEGATIVE

## 2015-01-23 LAB — LIPASE, BLOOD: Lipase: 26 U/L (ref 11–59)

## 2015-01-23 MED ORDER — KETOROLAC TROMETHAMINE 60 MG/2ML IM SOLN
60.0000 mg | Freq: Once | INTRAMUSCULAR | Status: AC
  Administered 2015-01-23: 60 mg via INTRAMUSCULAR
  Filled 2015-01-23: qty 2

## 2015-01-23 MED ORDER — VALACYCLOVIR HCL 500 MG PO TABS
1000.0000 mg | ORAL_TABLET | Freq: Once | ORAL | Status: AC
  Administered 2015-01-23: 1000 mg via ORAL
  Filled 2015-01-23: qty 2

## 2015-01-23 MED ORDER — VALACYCLOVIR HCL 1 G PO TABS: 1000.0000 mg | ORAL_TABLET | Freq: Three times a day (TID) | ORAL | Status: DC

## 2015-01-23 NOTE — Discharge Instructions (Signed)
Radicular Pain  Ms. [name], you were seen today for nerve pain. This may be shingles or compression of a nerve in your back. Take medication as prescribed and follow up with your primary doctor within 3 days.  If symptoms worsen, come back to the ED for repeat evaluation. Thank you. Radicular pain in either the arm or leg is usually from a bulging or herniated disk in the spine. A piece of the herniated disk may press against the nerves as the nerves exit the spine. This causes pain which is felt at the tips of the nerves down the arm or leg. Other causes of radicular pain may include:  Fractures.  Heart disease.  Cancer.  An abnormal and usually degenerative state of the nervous system or nerves (neuropathy). Diagnosis may require CT or MRI scanning to determine the primary cause.  Nerves that start at the neck (nerve roots) may cause radicular pain in the outer shoulder and arm. It can spread down to the thumb and fingers. The symptoms vary depending on which nerve root has been affected. In most cases radicular pain improves with conservative treatment. Neck problems may require physical therapy, a neck collar, or cervical traction. Treatment may take many weeks, and surgery may be considered if the symptoms do not improve.  Conservative treatment is also recommended for sciatica. Sciatica causes pain to radiate from the lower back or buttock area down the leg into the foot. Often there is a history of back problems. Most patients with sciatica are better after 2 to 4 weeks of rest and other supportive care. Short term bed rest can reduce the disk pressure considerably. Sitting, however, is not a good position since this increases the pressure on the disk. You should avoid bending, lifting, and all other activities which make the problem worse. Traction can be used in severe cases. Surgery is usually reserved for patients who do not improve within the first months of treatment. Only take  over-the-counter or prescription medicines for pain, discomfort, or fever as directed by your caregiver. Narcotics and muscle relaxants may help by relieving more severe pain and spasm and by providing mild sedation. Cold or massage can give significant relief. Spinal manipulation is not recommended. It can increase the degree of disc protrusion. Epidural steroid injections are often effective treatment for radicular pain. These injections deliver medicine to the spinal nerve in the space between the protective covering of the spinal cord and back bones (vertebrae). Your caregiver can give you more information about steroid injections. These injections are most effective when given within two weeks of the onset of pain.  You should see your caregiver for follow up care as recommended. A program for neck and back injury rehabilitation with stretching and strengthening exercises is an important part of management.  SEEK IMMEDIATE MEDICAL CARE IF:  You develop increased pain, weakness, or numbness in your arm or leg.  You develop difficulty with bladder or bowel control.  You develop abdominal pain. Document Released: 12/30/2004 Document Revised: 02/14/2012 Document Reviewed: 03/17/2009 [facility] Patient Information 2015 [place], [place]. This information is not intended to replace advice given to you by your health care provider. Make sure you discuss any questions you have with your health care provider.

## 2015-01-23 NOTE — ED Notes (Signed)
MD (Oni) at bedside. 

## 2015-01-23 NOTE — Telephone Encounter (Signed)
Gabapentin 300mg  3 capsules 900 mg 3 times a day # 270 called to pharmacy with 1 refill patient is aware

## 2015-01-23 NOTE — Telephone Encounter (Signed)
     Expand All Collapse All   The gabapentin is not working patient is complaining of back pain now  Please advise .

## 2015-01-23 NOTE — ED Provider Notes (Signed)
CSN: [id number]     Arrival date & time 01/22/15  2307 History  This chart was scribed for Belinda Balls, MD by Rayfield Citizen, ED Scribe. This patient was seen in room A02C/A02C and the patient's care was started at 2:02 AM.    Chief Complaint  Patient presents with  . Abdominal Pain   Patient is a 39 y.o. female presenting with abdominal pain. The history is provided by the patient. No language interpreter was used.  Abdominal Pain    HPI Comments: [name] is a 39 y.o. female who presents to the Emergency Department complaining of back pain and upper left abdominal pain, beginning in November 2015. Patient reports that she was previously given Gabapentin; this improved her pain initially but it has returned at this time. Her dose was recently increased to 600mg  3x per day. She explains that her current back pain is so severe that it keeps her up at night. She was told by her neurologist to have her PCP begin treating her for shingles; she was scheduled to see her PCP at the beginning of this week but due to inclement weather, she was unable to make that appointment. She denies fevers, urinary symptoms.   Past Medical History  Diagnosis Date  . Morbid obesity   . Alveolar hypoventilation   . Asthma   . GERD (gastroesophageal reflux disease)   . Rectal fissure   . Hypertension   . Costochondritis   . Cellulitis 08/2010-08/2011  . Depression   . Bipolar 2 disorder   . Obstructive sleep apnea   . Chest pain     NO CAD by cath 3/50/09, nl LV systolic fxn  . Diabetes mellitus 2000    Type 2, Uncontrolled  . Arthritis   . HLD (hyperlipidemia)   . Anemia   . COPD (chronic obstructive pulmonary disease)    Past Surgical History  Procedure Laterality Date  . Carpal tunnel release    . Bil knee surgery    . Mass excision N/A 06/29/2013    Procedure:  WIDE LOCAL EXCISION OF POSTERIOR NECK ABSCESS;  Surgeon: Ralene Ok, MD;  Location: [facility];  Service: General;  Laterality: N/A;  .  Cardiac catheterization    . Left heart catheterization with coronary angiogram N/A 07/27/2012    Procedure: LEFT HEART CATHETERIZATION WITH CORONARY ANGIOGRAM;  Surgeon: Sherren Mocha, MD;  Location: [facility] CATH LAB;  Service: Cardiovascular;  Laterality: N/A;   Family History  Problem Relation Age of Onset  . Diabetes Mother   . Hyperlipidemia Mother   . Depression Mother   . Heart attack Paternal Uncle   . Heart disease Paternal Grandmother   . Heart attack Paternal Grandmother   . Heart attack Paternal Grandfather   . Heart disease Paternal Grandfather    History  Substance Use Topics  . Smoking status: Former Smoker -- 0.30 packs/day for .3 years    Types: Cigarettes    Quit date: 12/06/1993  . Smokeless tobacco: Never Used  . Alcohol Use: No   OB History    No data available     Review of Systems  Gastrointestinal: Positive for abdominal pain.  Musculoskeletal: Positive for back pain.  All other systems reviewed and are negative.  Allergies  Kiwi extract and Nubain  Home Medications   Prior to Admission medications   Medication Sig Start Date End Date Taking? Authorizing Provider  ACCU-CHEK FASTCLIX LANCETS MISC Use to check blood sugar 2 times per day. 10/07/14   [name]  Loanne Drilling, MD  albuterol (PROVENTIL HFA;VENTOLIN HFA) 108 (90 BASE) MCG/ACT inhaler Inhale 1 puff into the lungs every 6 (six) hours as needed for wheezing or shortness of breath. 06/11/14   [name], MD  atorvastatin (LIPITOR) 20 MG tablet Take 20 mg by mouth daily.    Historical Provider, MD  BYETTA 5 MCG PEN 5 MCG/0.02ML SOPN injection INJECT 0.02 MLS (5 MCG TOTAL) SUBCUTANEOUSLY TWICE DAILY WITH A MEAL 10/07/14   [name], MD  carvedilol (COREG) 12.5 MG tablet Take 12.5 mg by mouth 2 (two) times daily with a meal.    Historical Provider, MD  cetirizine (ZYRTEC) 10 MG tablet Take 10 mg by mouth daily as needed for allergies.    Historical Provider, MD  clotrimazole (LOTRIMIN) 1 % cream APPLY  TOPICALLY 2 TIMES A DAY AS NEEDED FOR SKIN YEAST 01/03/15   [name], MD  fluticasone [facility]) 50 MCG/ACT nasal spray Place 2 sprays into both nostrils daily. 05/21/14   Melony Overly, MD  gabapentin (NEURONTIN) 300 MG capsule Take 2 capsules (600 mg total) by mouth 3 (three) times daily. 01/08/15   [name], DO  HYDROcodone-acetaminophen (NORCO) 10-325 MG per tablet Take 1 tablet by mouth 3 (three) times daily as needed for moderate pain. Do not fill before 01/11/2015 12/18/14   [name], MD  ibuprofen (ADVIL,MOTRIN) 800 MG tablet Take 1 tablet (800 mg total) by mouth 3 (three) times daily with meals. 12/29/14   [name], PA-C  insulin aspart (NOVOLOG FLEXPEN) 100 UNIT/ML FlexPen INJECT 90 UNITS INTO THE SKIN THREE TIMES A DAY WITH MEALS. 11/19/14   [name], MD  Insulin Glargine (LANTUS SOLOSTAR) 100 UNIT/ML Solostar Pen Inject 150 units twice daily. 12/12/14   [name], MD  lisinopril (PRINIVIL,ZESTRIL) 10 MG tablet Take 1 tablet (10 mg total) by mouth daily. 03/12/14   [name], MD  NITROSTAT 0.4 MG SL tablet DISSOLVE 1 TABLET UNDER THE TONGUE EVERY 5 MINUTES AS NEEDED FOR CHEST PAIN. 11/13/14   [name], MD  nortriptyline (PAMELOR) 25 MG capsule Take 1 capsule (25 mg total) by mouth at bedtime. 12/17/14   [name], DO  oxyCODONE-acetaminophen (PERCOCET/ROXICET) 5-325 MG per tablet Take 1 tablet by mouth every 6 (six) hours as needed for moderate pain or severe pain. 12/29/14   [name], PA-C  pantoprazole (PROTONIX) 40 MG tablet Take 40 mg by mouth daily.    Historical Provider, MD  SYMBICORT 160-4.5 MCG/ACT inhaler INHALE 2 PUFFS INTO LUNGS TWICE DAILY 10/18/14   [name], MD  topiramate (TOPAMAX) 25 MG tablet TAKE 1 TABLET BY MOUTH EVERY NIGHT AT BEDTIME 10/14/14   [name], MD  traMADol (ULTRAM) 50 MG tablet TAKE 1 TABLET BY MOUTH EVERY DAY AS NEEDED FOR MODERATE PAIN 11/11/14   [name], MD   BP 133/72  mmHg  Pulse 100  Temp(Src) 98.5 F (36.9 C) (Oral)  Resp 22  Ht 5\' 2"  (1.575 m)  Wt 320 lb (145.151 kg)  BMI 58.51 kg/m2  SpO2 95%  LMP 12/18/2014 (Exact Date) Physical Exam  Constitutional: She is oriented to person, place, and time. She appears well-developed and well-nourished. No distress.  HENT:  Head: Normocephalic and atraumatic.  Mouth/Throat: Oropharynx is clear and moist. No oropharyngeal exudate.  Eyes: EOM are normal. Pupils are equal, round, and reactive to light.  Neck: Normal range of motion. Neck supple.  Cardiovascular: Normal rate, regular rhythm and normal heart sounds.  Exam reveals no gallop and no friction rub.   No murmur heard. Pulmonary/Chest: Effort normal. No respiratory distress. She has no wheezes. She has no rales.  Abdominal: Soft. There is tenderness (Tenderness to light touch of the left upper quadrant). There is no rebound and no guarding.  Musculoskeletal: Normal range of motion. She exhibits no edema.  Neurological: She is alert and oriented to person, place, and time.  Skin: Skin is warm and dry. No rash noted.  Psychiatric: She has a normal mood and affect. Her behavior is normal.  Nursing note and vitals reviewed.   ED Course  Procedures   DIAGNOSTIC STUDIES: Oxygen Saturation is 95% on RA, normal by my interpretation.    COORDINATION OF CARE: 2:10 AM Discussed treatment plan with pt at bedside and pt agreed to plan.   Labs Review Labs Reviewed  CBC WITH DIFFERENTIAL/PLATELET - Abnormal; Notable for the following:    Lymphocytes Relative 49 (*)    Lymphs Abs 5.2 (*)    All other components within normal limits  COMPREHENSIVE METABOLIC PANEL - Abnormal; Notable for the following:    Sodium 131 (*)    Glucose, Bld 441 (*)    Albumin 2.9 (*)    All other components within normal limits  URINALYSIS, ROUTINE W REFLEX MICROSCOPIC - Abnormal; Notable for the following:    Specific Gravity, Urine 1.039 (*)    Glucose, UA >1000 (*)     Protein, ur 30 (*)    All other components within normal limits  URINE MICROSCOPIC-ADD ON - Abnormal; Notable for the following:    Squamous Epithelial / LPF FEW (*)    All other components within normal limits  LIPASE, BLOOD  PREGNANCY, URINE    Imaging Review No results found.   EKG Interpretation None      MDM   Final diagnoses:  None  Patient presents to the ED for LUQ abd pain radiating to the back since November.  Her skin is sensitve to light touch, there is no tenderness to deep palpation.  Gabapentin helped her pain transiently and neurologist is concerned for shingles, which can present without a rash.  She will be given valcyte in the ED and sent home with Rx.  PCP fu advised.  She was also given toradol shot for pain.  Her pain is dermatomal.  She denies other neurological complaints and has no red flags for back pain.  Her VS remain within her normal limits and she is safe for DC.  I personally performed the services described in this documentation, which was scribed in my presence. The recorded information has been reviewed and is accurate.   Belinda Balls, MD 01/23/15 [phone]

## 2015-01-23 NOTE — Telephone Encounter (Signed)
The gabapentin is not working patient is complaining of back pain now Call [phone]

## 2015-01-23 NOTE — Telephone Encounter (Signed)
I would increase gabapentin to 900mg  three times daily.  She should call in 1 to 2 weeks with update.

## 2015-01-24 ENCOUNTER — Ambulatory Visit (INDEPENDENT_AMBULATORY_CARE_PROVIDER_SITE_OTHER): Payer: Medicaid Other | Admitting: Endocrinology

## 2015-01-24 ENCOUNTER — Encounter: Payer: Self-pay | Admitting: Endocrinology

## 2015-01-24 VITALS — BP 128/86 | HR 105 | Temp 97.6°F | Ht 62.0 in | Wt 330.0 lb

## 2015-01-24 DIAGNOSIS — E1165 Type 2 diabetes mellitus with hyperglycemia: Secondary | ICD-10-CM

## 2015-01-24 DIAGNOSIS — G4733 Obstructive sleep apnea (adult) (pediatric): Secondary | ICD-10-CM

## 2015-01-24 DIAGNOSIS — IMO0002 Reserved for concepts with insufficient information to code with codable children: Secondary | ICD-10-CM

## 2015-01-24 NOTE — Patient Instructions (Addendum)
check your blood sugar twice a day.  vary the time of day when you check, between before the 3 meals, and at bedtime.  also check if you have symptoms of your blood sugar being too high or too low.  please keep a record of the readings and bring it to your next appointment here.  You can write it on any piece of paper.  please call us sooner if your blood sugar goes below 70, or if you have a lot of readings over 200.   It is really important to work on your diet, as this helps the blood sugar.   In view of your medical condition, you should avoid pregnancy until we have decided it is safe.     Please come back for a follow-up appointment in 1 month.   blood and urine tests are being requested for you today.  We'll let you know about the results.

## 2015-01-24 NOTE — Progress Notes (Signed)
Subjective:    Patient ID: [name], female    DOB: 01-09-1976, 39 y.o.   MRN: [id number]  HPI Pt returns for f/u of diabetes mellitus: DM type: Insulin-requiring type 2 Dx'ed: 8325 Complications: polyneuropathy. Therapy: insulin since 2007 pregnancy DKA: never Severe hypoglycemia: never Pancreatitis: never.   Other: she takes multiple daily injections; she can't have weight-loss surgery, due to having medicaid.  Interval history: no cbg record, but states cbg's vary from 100-250.  It is in general higher as the day goes on.  She says she never misses the insulin.  pt states she feels well in general.   Past Medical History  Diagnosis Date  . Morbid obesity   . Alveolar hypoventilation   . Asthma   . GERD (gastroesophageal reflux disease)   . Rectal fissure   . Hypertension   . Costochondritis   . Cellulitis 08/2010-08/2011  . Depression   . Bipolar 2 disorder   . Obstructive sleep apnea   . Chest pain     NO CAD by cath 4/98/26, nl LV systolic fxn  . Diabetes mellitus 2000    Type 2, Uncontrolled  . Arthritis   . HLD (hyperlipidemia)   . Anemia   . COPD (chronic obstructive pulmonary disease)     Past Surgical History  Procedure Laterality Date  . Carpal tunnel release    . Bil knee surgery    . Mass excision N/A 06/29/2013    Procedure:  WIDE LOCAL EXCISION OF POSTERIOR NECK ABSCESS;  Surgeon: Ralene Ok, MD;  Location: [facility];  Service: General;  Laterality: N/A;  . Cardiac catheterization    . Left heart catheterization with coronary angiogram N/A 07/27/2012    Procedure: LEFT HEART CATHETERIZATION WITH CORONARY ANGIOGRAM;  Surgeon: Sherren Mocha, MD;  Location: [facility] CATH LAB;  Service: Cardiovascular;  Laterality: N/A;    History   Social History  . Marital Status: Married    Spouse Name: N/A  . Number of Children: N/A  . Years of Education: N/A   Occupational History  . Not on file.   Social History Main Topics  . Smoking status: Former Smoker --  0.30 packs/day for .3 years    Types: Cigarettes    Quit date: 12/06/1993  . Smokeless tobacco: Never Used  . Alcohol Use: No  . Drug Use: No  . Sexual Activity:    Partners: Male   Other Topics Concern  . Not on file   Social History Narrative   Lives in [place] with her fiance and 37 yr old dtr.    Current Outpatient Prescriptions on File Prior to Visit  Medication Sig Dispense Refill  . ACCU-CHEK FASTCLIX LANCETS MISC Use to check blood sugar 2 times per day. 100 each 2  . albuterol (PROVENTIL HFA;VENTOLIN HFA) 108 (90 BASE) MCG/ACT inhaler Inhale 1 puff into the lungs every 6 (six) hours as needed for wheezing or shortness of breath. 18 g 11  . atorvastatin (LIPITOR) 20 MG tablet Take 20 mg by mouth daily.    Marland Kitchen BYETTA 5 MCG PEN 5 MCG/0.02ML SOPN injection INJECT 0.02 MLS (5 MCG TOTAL) SUBCUTANEOUSLY TWICE DAILY WITH A MEAL 2 pen 2  . carvedilol (COREG) 12.5 MG tablet Take 12.5 mg by mouth 2 (two) times daily with a meal.    . cetirizine (ZYRTEC) 10 MG tablet Take 10 mg by mouth daily as needed for allergies.    . clotrimazole (LOTRIMIN) 1 % cream APPLY TOPICALLY 2 TIMES A DAY AS  NEEDED FOR SKIN YEAST 60 g 0  . fluticasone (FLONASE) 50 MCG/ACT nasal spray Place 2 sprays into both nostrils daily. 16 g 6  . gabapentin (NEURONTIN) 300 MG capsule Take 2 capsules (600 mg total) by mouth 3 (three) times daily. 180 capsule 2  . HYDROcodone-acetaminophen (NORCO) 10-325 MG per tablet Take 1 tablet by mouth 3 (three) times daily as needed for moderate pain. Do not fill before 01/11/2015 90 tablet 0  . ibuprofen (ADVIL,MOTRIN) 800 MG tablet Take 1 tablet (800 mg total) by mouth 3 (three) times daily with meals. 21 tablet 0  . Insulin Glargine (LANTUS SOLOSTAR) 100 UNIT/ML Solostar Pen Inject 150 units twice daily. 30 pen 2  . lisinopril (PRINIVIL,ZESTRIL) 10 MG tablet Take 1 tablet (10 mg total) by mouth daily. 30 tablet 5  . NITROSTAT 0.4 MG SL tablet DISSOLVE 1 TABLET UNDER THE TONGUE EVERY 5  MINUTES AS NEEDED FOR CHEST PAIN. 30 tablet 1  . nortriptyline (PAMELOR) 25 MG capsule Take 1 capsule (25 mg total) by mouth at bedtime. 30 capsule 5  . oxyCODONE-acetaminophen (PERCOCET/ROXICET) 5-325 MG per tablet Take 1 tablet by mouth every 6 (six) hours as needed for moderate pain or severe pain. 10 tablet 0  . pantoprazole (PROTONIX) 40 MG tablet Take 40 mg by mouth daily.    . SYMBICORT 160-4.5 MCG/ACT inhaler INHALE 2 PUFFS INTO LUNGS TWICE DAILY 10.2 g 4  . topiramate (TOPAMAX) 25 MG tablet TAKE 1 TABLET BY MOUTH EVERY NIGHT AT BEDTIME 30 tablet 3  . traMADol (ULTRAM) 50 MG tablet TAKE 1 TABLET BY MOUTH EVERY DAY AS NEEDED FOR MODERATE PAIN 15 tablet 2  . valACYclovir (VALTREX) 1000 MG tablet Take 1 tablet (1,000 mg total) by mouth 3 (three) times daily. 21 tablet 0   No current facility-administered medications on file prior to visit.    Allergies  Allergen Reactions  . Kiwi Extract Anaphylaxis  . Nubain [Nalbuphine Hcl] Other (See Comments)    Nervous...makes her feel like something is crawling on her.    Family History  Problem Relation Age of Onset  . Diabetes Mother   . Hyperlipidemia Mother   . Depression Mother   . Heart attack Paternal Uncle   . Heart disease Paternal Grandmother   . Heart attack Paternal Grandmother   . Heart attack Paternal Grandfather   . Heart disease Paternal Grandfather     BP 128/86 mmHg  Pulse 105  Temp(Src) 97.6 F (36.4 C) (Oral)  Ht 5\' 2"  (1.575 m)  Wt 330 lb (149.687 kg)  BMI 60.34 kg/m2  SpO2 98%  LMP 12/18/2014 (Exact Date)   Review of Systems She denies hypoglycemia.  She has gained weight.      Objective:   Physical Exam VITAL SIGNS:  See vs page GENERAL: no distress Pulses: dorsalis pedis intact bilat.   MSK: no deformity of the feet CV: trace bilat leg edema Skin:  no ulcer on the feet.  normal color and temp on the feet. Neuro: sensation is intact to touch on the feet, but decreased from normal   Lab Results    Component Value Date   HGBA1C 12.6* 01/24/2015       Assessment & Plan:  DM: severe exacerbation Noncompliance with cbg recording: this, and the lack of improvement in her a1c with increased insulin raises ? of compliance with insulin, also Weight gain: pt is advise to redouble dietary efforts.     Patient is advised the following: Patient Instructions  check  your blood sugar twice a day.  vary the time of day when you check, between before the 3 meals, and at bedtime.  also check if you have symptoms of your blood sugar being too high or too low.  please keep a record of the readings and bring it to your next appointment here.  You can write it on any piece of paper.  please call us sooner if your blood sugar goes below 70, or if you have a lot of readings over 200.   It is really important to work on your diet, as this helps the blood sugar.   In view of your medical condition, you should avoid pregnancy until we have decided it is safe.     Please come back for a follow-up appointment in 1 month.   blood and urine tests are being requested for you today.  We'll let you know about the results.    increase the novolog to 120 units 3 times a day (just before each meal). Also, as high as your blood sugar is, also please consider changing your current 5 injections per day, to lantus just once per day, at a higher amount. Please let me know.

## 2015-01-25 LAB — MICROALBUMIN / CREATININE URINE RATIO
Creatinine, Urine: 23.9 mg/dL
Microalb Creat Ratio: 728 mg/g — ABNORMAL HIGH (ref 0.0–30.0)
Microalb, Ur: 17.4 mg/dL — ABNORMAL HIGH (ref <2.0)

## 2015-01-25 LAB — HEMOGLOBIN A1C
Hgb A1c MFr Bld: 12.6 % — ABNORMAL HIGH (ref <5.7)
Mean Plasma Glucose: 315 mg/dL — ABNORMAL HIGH (ref <117)

## 2015-01-25 MED ORDER — INSULIN ASPART 100 UNIT/ML FLEXPEN: 120.0000 [IU] | PEN_INJECTOR | Freq: Three times a day (TID) | SUBCUTANEOUS | Status: DC

## 2015-01-27 ENCOUNTER — Other Ambulatory Visit: Payer: Self-pay | Admitting: Endocrinology

## 2015-01-27 ENCOUNTER — Telehealth: Payer: Self-pay | Admitting: Endocrinology

## 2015-01-27 MED ORDER — INSULIN GLARGINE 100 UNIT/ML SOLOSTAR PEN: 500.0000 [IU] | PEN_INJECTOR | SUBCUTANEOUS | Status: DC

## 2015-01-27 NOTE — Telephone Encounter (Signed)
Tiffany from [facility] called and asked if patient could have a 30 day supply of Lantus Solostar. Phone # [phone]

## 2015-01-27 NOTE — Telephone Encounter (Signed)
Pharmacy advised of current dosage of lantus.

## 2015-01-30 ENCOUNTER — Other Ambulatory Visit: Payer: Self-pay | Admitting: Family Medicine

## 2015-01-31 ENCOUNTER — Ambulatory Visit (INDEPENDENT_AMBULATORY_CARE_PROVIDER_SITE_OTHER): Payer: Medicaid Other | Admitting: Family Medicine

## 2015-01-31 ENCOUNTER — Encounter: Payer: Self-pay | Admitting: Family Medicine

## 2015-01-31 VITALS — BP 140/88 | HR 88 | Ht 62.0 in | Wt 326.0 lb

## 2015-01-31 DIAGNOSIS — I10 Essential (primary) hypertension: Secondary | ICD-10-CM

## 2015-01-31 DIAGNOSIS — G580 Intercostal neuropathy: Secondary | ICD-10-CM

## 2015-01-31 DIAGNOSIS — Z7189 Other specified counseling: Secondary | ICD-10-CM

## 2015-01-31 DIAGNOSIS — G8929 Other chronic pain: Secondary | ICD-10-CM

## 2015-01-31 MED ORDER — CYCLOBENZAPRINE HCL 10 MG PO TABS: 10.0000 mg | ORAL_TABLET | Freq: Two times a day (BID) | ORAL | Status: DC | PRN

## 2015-01-31 MED ORDER — HYDROCODONE-ACETAMINOPHEN 10-325 MG PO TABS: 1.0000 | ORAL_TABLET | Freq: Three times a day (TID) | ORAL | Status: DC | PRN

## 2015-01-31 MED ORDER — TRAMADOL HCL 50 MG PO TABS: ORAL_TABLET | ORAL | Status: DC

## 2015-01-31 NOTE — Patient Instructions (Addendum)
It was great to see you again today!  We refilled yourr pain medicine today. Follow-up in 3 months for more refills. I think your back will continue to get better. It seems like a muscle spasm or pulled muscle. I sent in a short-term course of Flexeril for you to try. Use a heating pad or ice as needed. You can continue to try gentle stretching, or positions that are more comfortable. Follow-up if this is not improving in the next couple of weeks.  You can try ibuprofen as needed for menstrual cramps. Come back in a couple weeks for a blood pressure check with the nurse. I want to make sure your blood pressure is better when you're not in pain.  Be well, Dr. [name]

## 2015-01-31 NOTE — Progress Notes (Signed)
Patient ID: [name], female   DOB: October 15, 1976, 39 y.o.   MRN: [id number]  HPI:  Pain after fall: pt states that 3-4 days ago she slid in some mud and fell. She injured her L knee and the R side of her back. Both of these are gradually getting better with time. She landed on concrete but did not hit her head. The knee is bruised and ahs an abrasion but she is able to bear weight. She tried taking a shower under hot water to help the R back pain, but the hot water made it worse and she had to be helped out of the shower due to being in so much pain. Has tried voltaren gel on her back. Has also tried ice packs, which have helped. She is still hurting some but again, this is improved. Has some pain radiating to her R arm now. More worried about her back than her knee at this time. Denies fever, saddle anesthesia, weakness in legs, or problems with stooling or urination. The back pain is worse with moving or sitting in certain ways.  L flank skin pain (subacute):  Went to ER and got valtrex rx for possible herpes zoster, despite having no skin lesions. She is on gabapentin 900mg  TID for this pain. Pain is slowly getting better.   Chronic back and knee pain: taking norco 10-325mg  TID. Also does tramadol 50mg  daily as needed, with quantity of 15 tabs per month. She is off flexeril. Does note that her toes have been burning, likely due to uncontrolled diabetes.  ROS: See HPI.  [place]: morbid obesity, chronic pain requiring narcotic use, T2DM, HTN, asthma, GERD, HLD  PHYSICAL EXAM: BP 140/88 mmHg  Pulse 88  Ht 5\' 2"  (1.575 m)  Wt 326 lb (147.873 kg)  BMI 59.61 kg/m2  LMP 01/27/2015 (Exact Date) Gen: NAD HEENT: NCAT Neuro: grossly nonfocal speech normal Ext: L knee with abrasion to anterior surface with appropriate scabbing, no drainage or signs of infection. Skin TTP in this area but no deeper bony tenderness. MCL and LCL intact. Full strength in bilat lower extremities. Back: R low back musculature  TTP. No bruising or deformities. Skin: no lesions seen over L flank.  ASSESSMENT/PLAN:  Neuropathy, intercostal nerve Slowly improving on valtrex and gabapentin. No med changes today.   Encounter for chronic pain management Chronic pain stable and doing well with current norco dose. Obvious reason for new increased pain with recent fall. No signs of fracture or other serious injury as sequelae of fall. Pain gradually improving. Recommend continued use of ice/heat. Will also rx short course of flexeril to help with back spasm. Refill norco x 3 months. F/u in 3 mos. **Needs UDS at next appointment**   Essential hypertension Reasonable control on recheck (140/88). Recommend pt check BP at home and return for visit if she consistently gets over 140/90. Otherwise will just f/u in 3 months.    Pt asked at end of visit what to use for menstrual cramps - recommend intermittent NSAIDs, not for chronic or daily use.  FOLLOW UP: F/u in 3 mos for chronic pain, sooner acute issues if not improving.  [place]. [name], [place]

## 2015-02-01 NOTE — Assessment & Plan Note (Signed)
Reasonable control on recheck (140/88). Recommend pt check BP at home and return for visit if she consistently gets over 140/90. Otherwise will just f/u in 3 months.

## 2015-02-01 NOTE — Assessment & Plan Note (Signed)
Slowly improving on valtrex and gabapentin. No med changes today.

## 2015-02-01 NOTE — Assessment & Plan Note (Addendum)
Chronic pain stable and doing well with current norco dose. Obvious reason for new increased pain with recent fall. No signs of fracture or other serious injury as sequelae of fall. Pain gradually improving. Recommend continued use of ice/heat. Will also rx short course of flexeril to help with back spasm. Refill norco x 3 months. F/u in 3 mos. **Needs UDS at next appointment**

## 2015-02-02 ENCOUNTER — Inpatient Hospital Stay (HOSPITAL_COMMUNITY)
Admission: AD | Admit: 2015-02-02 | Discharge: 2015-02-02 | Disposition: A | Discharge status: Home / Self Care | Payer: Medicaid Other | Source: Ambulatory Visit | Attending: Obstetrics and Gynecology | Admitting: Obstetrics and Gynecology

## 2015-02-02 DIAGNOSIS — Z6841 Body Mass Index (BMI) 40.0 and over, adult: Secondary | ICD-10-CM | POA: Insufficient documentation

## 2015-02-02 DIAGNOSIS — G4733 Obstructive sleep apnea (adult) (pediatric): Secondary | ICD-10-CM | POA: Insufficient documentation

## 2015-02-02 DIAGNOSIS — N909 Noninflammatory disorder of vulva and perineum, unspecified: Secondary | ICD-10-CM | POA: Diagnosis not present

## 2015-02-02 DIAGNOSIS — K219 Gastro-esophageal reflux disease without esophagitis: Secondary | ICD-10-CM | POA: Diagnosis not present

## 2015-02-02 DIAGNOSIS — I10 Essential (primary) hypertension: Secondary | ICD-10-CM | POA: Diagnosis not present

## 2015-02-02 DIAGNOSIS — J449 Chronic obstructive pulmonary disease, unspecified: Secondary | ICD-10-CM | POA: Diagnosis not present

## 2015-02-02 DIAGNOSIS — Z87891 Personal history of nicotine dependence: Secondary | ICD-10-CM | POA: Diagnosis not present

## 2015-02-02 DIAGNOSIS — N9089 Other specified noninflammatory disorders of vulva and perineum: Secondary | ICD-10-CM

## 2015-02-02 DIAGNOSIS — S3141XA Laceration without foreign body of vagina and vulva, initial encounter: Secondary | ICD-10-CM

## 2015-02-02 DIAGNOSIS — N949 Unspecified condition associated with female genital organs and menstrual cycle: Secondary | ICD-10-CM | POA: Diagnosis present

## 2015-02-02 LAB — URINALYSIS, ROUTINE W REFLEX MICROSCOPIC
Bilirubin Urine: NEGATIVE
Glucose, UA: >1000 mg/dL — AB
Ketones, ur: 15 mg/dL — AB
Nitrite: NEGATIVE
PH: 5.5 (ref 5.0–8.0)
Protein, ur: NEGATIVE mg/dL
Specific Gravity, Urine: 1.015 (ref 1.005–1.030)
Urobilinogen, UA: 0.2 mg/dL (ref 0.0–1.0)

## 2015-02-02 LAB — URINE MICROSCOPIC-ADD ON

## 2015-02-02 LAB — WET PREP, GENITAL
CLUE CELLS WET PREP: NONE SEEN
Trich, Wet Prep: NONE SEEN
Yeast Wet Prep HPF POC: NONE SEEN

## 2015-02-02 LAB — POCT PREGNANCY, URINE: PREG TEST UR: NEGATIVE

## 2015-02-02 NOTE — Discharge Instructions (Signed)
Vaginitis Vaginitis is an inflammation of the vagina. It is most often caused by a change in the normal balance of the bacteria and yeast that live in the vagina. This change in balance causes an overgrowth of certain bacteria or yeast, which causes the inflammation. There are different types of vaginitis, but the most common types are:  Bacterial vaginosis.  Yeast infection (candidiasis).  Trichomoniasis vaginitis. This is a sexually transmitted infection (STI).  Viral vaginitis.  Atropic vaginitis.  Allergic vaginitis. CAUSES  The cause depends on the type of vaginitis. Vaginitis can be caused by:  Bacteria (bacterial vaginosis).  Yeast (yeast infection).  A parasite (trichomoniasis vaginitis)  A virus (viral vaginitis).  Low hormone levels (atrophic vaginitis). Low hormone levels can occur during pregnancy, breastfeeding, or after menopause.  Irritants, such as bubble baths, scented tampons, and feminine sprays (allergic vaginitis). Other factors can change the normal balance of the yeast and bacteria that live in the vagina. These include:  Antibiotic medicines.  Poor hygiene.  Diaphragms, vaginal sponges, spermicides, birth control pills, and intrauterine devices (IUD).  Sexual intercourse.  Infection.  Uncontrolled diabetes.  A weakened immune system. SYMPTOMS  Symptoms can vary depending on the cause of the vaginitis. Common symptoms include:  Abnormal vaginal discharge.  The discharge is white, gray, or yellow with bacterial vaginosis.  The discharge is thick, white, and cheesy with a yeast infection.  The discharge is frothy and yellow or greenish with trichomoniasis.  A bad vaginal odor.  The odor is fishy with bacterial vaginosis.  Vaginal itching, pain, or swelling.  Painful intercourse.  Pain or burning when urinating. Sometimes, there are no symptoms. TREATMENT  Treatment will vary depending on the type of infection.   Bacterial  vaginosis and trichomoniasis are often treated with antibiotic creams or pills.  Yeast infections are often treated with antifungal medicines, such as vaginal creams or suppositories.  Viral vaginitis has no cure, but symptoms can be treated with medicines that relieve discomfort. Your sexual partner should be treated as well.  Atrophic vaginitis may be treated with an estrogen cream, pill, suppository, or vaginal ring. If vaginal dryness occurs, lubricants and moisturizing creams may help. You may be told to avoid scented soaps, sprays, or douches.  Allergic vaginitis treatment involves quitting the use of the product that is causing the problem. Vaginal creams can be used to treat the symptoms. HOME CARE INSTRUCTIONS   Take all medicines as directed by your caregiver.  Keep your genital area clean and dry. Avoid soap and only rinse the area with water.  Avoid douching. It can remove the healthy bacteria in the vagina.  Do not use tampons or have sexual intercourse until your vaginitis has been treated. Use sanitary pads while you have vaginitis.  Wipe from front to back. This avoids the spread of bacteria from the rectum to the vagina.  Let air reach your genital area.  Wear cotton underwear to decrease moisture buildup.  Avoid wearing underwear while you sleep until your vaginitis is gone.  Avoid tight pants and underwear or nylons without a cotton panel.  Take off wet clothing (especially bathing suits) as soon as possible.  Use mild, non-scented products. Avoid using irritants, such as:  Scented feminine sprays.  Fabric softeners.  Scented detergents.  Scented tampons.  Scented soaps or bubble baths.  Practice safe sex and use condoms. Condoms may prevent the spread of trichomoniasis and viral vaginitis. SEEK MEDICAL CARE IF:   You have abdominal pain.  You   have a fever or persistent symptoms for more than 2-3 days.  You have a fever and your symptoms suddenly  get worse. Document Released: 09/19/2007 Document Revised: 08/16/2012 Document Reviewed: 05/04/2012 Exit[employer] Patient Information 2015 [employer]. This information is not intended to replace advice given to you by your health care provider. Make sure you discuss any questions you have with your health care provider.  

## 2015-02-02 NOTE — MAU Note (Signed)
Pt presents to MAU with complaints of having some vaginal bleeding. States she had her menstrual cycle this week until Thursday and now is having vaginal spotting. Reports vaginal pain and pressure

## 2015-02-02 NOTE — MAU Provider Note (Signed)
History     CSN: [id number]  Arrival date and time: 02/02/15 4944   First Provider Initiated Contact with Patient 02/02/15 1036      Chief Complaint  Patient presents with  . Vaginal Pain  . Vaginal Bleeding   HPI This is a 39 y.o. female who presents with c/o vaginal "bump" on left labia and some vaginal irritation and burning. Does not want STD testing. Has had some persistent spotting after period (this is Day 7 of cycle). But is not worried about that.  OB History    No data available      Past Medical History  Diagnosis Date  . Morbid obesity   . Alveolar hypoventilation   . Asthma   . GERD (gastroesophageal reflux disease)   . Rectal fissure   . Hypertension   . Costochondritis   . Cellulitis 08/2010-08/2011  . Depression   . Bipolar 2 disorder   . Obstructive sleep apnea   . Chest pain     NO CAD by cath 9/67/59, nl LV systolic fxn  . Diabetes mellitus 2000    Type 2, Uncontrolled  . Arthritis   . HLD (hyperlipidemia)   . Anemia   . COPD (chronic obstructive pulmonary disease)     Past Surgical History  Procedure Laterality Date  . Carpal tunnel release    . Bil knee surgery    . Mass excision N/A 06/29/2013    Procedure:  WIDE LOCAL EXCISION OF POSTERIOR NECK ABSCESS;  Surgeon: Ralene Ok, MD;  Location: [facility];  Service: General;  Laterality: N/A;  . Cardiac catheterization    . Left heart catheterization with coronary angiogram N/A 07/27/2012    Procedure: LEFT HEART CATHETERIZATION WITH CORONARY ANGIOGRAM;  Surgeon: Sherren Mocha, MD;  Location: [facility] CATH LAB;  Service: Cardiovascular;  Laterality: N/A;    Family History  Problem Relation Age of Onset  . Diabetes Mother   . Hyperlipidemia Mother   . Depression Mother   . Heart attack Paternal Uncle   . Heart disease Paternal Grandmother   . Heart attack Paternal Grandmother   . Heart attack Paternal Grandfather   . Heart disease Paternal Grandfather     History  Substance Use Topics  .  Smoking status: Former Smoker -- 0.30 packs/day for .3 years    Types: Cigarettes    Quit date: 12/06/1993  . Smokeless tobacco: Never Used  . Alcohol Use: No    Allergies:  Allergies  Allergen Reactions  . Kiwi Extract Anaphylaxis  . Nubain [Nalbuphine Hcl] Other (See Comments)    Nervous...makes her feel like something is crawling on her.    Prescriptions prior to admission  Medication Sig Dispense Refill Last Dose  . ACCU-CHEK FASTCLIX LANCETS MISC Use to check blood sugar 2 times per day. 100 each 2 Taking  . albuterol (PROVENTIL HFA;VENTOLIN HFA) 108 (90 BASE) MCG/ACT inhaler Inhale 1 puff into the lungs every 6 (six) hours as needed for wheezing or shortness of breath. 18 g 11 Taking  . atorvastatin (LIPITOR) 20 MG tablet Take 20 mg by mouth daily.   Taking  . BYETTA 5 MCG PEN 5 MCG/0.02ML SOPN injection INJECT 0.02 MLS (5 MCG TOTAL) SUBCUTANEOUSLY TWICE DAILY WITH A MEAL 2 pen 2 Taking  . carvedilol (COREG) 12.5 MG tablet Take 12.5 mg by mouth 2 (two) times daily with a meal.   Taking  . cetirizine (ZYRTEC) 10 MG tablet Take 10 mg by mouth daily as needed for allergies.  Taking  . clotrimazole (LOTRIMIN) 1 % cream APPLY TOPICALLY 2 TIMES A DAY AS NEEDED FOR SKIN YEAST 60 g 0 Taking  . cyclobenzaprine (FLEXERIL) 10 MG tablet Take 1 tablet (10 mg total) by mouth 2 (two) times daily as needed for muscle spasms. 30 tablet 0   . fluticasone (FLONASE) 50 MCG/ACT nasal spray Place 2 sprays into both nostrils daily. 16 g 6 Taking  . gabapentin (NEURONTIN) 300 MG capsule Take 2 capsules (600 mg total) by mouth 3 (three) times daily. 180 capsule 2 Taking  . HYDROcodone-acetaminophen (NORCO) 10-325 MG per tablet Take 1 tablet by mouth 3 (three) times daily as needed for moderate pain. Do not fill before 04/11/2015 90 tablet 0   . ibuprofen (ADVIL,MOTRIN) 800 MG tablet Take 1 tablet (800 mg total) by mouth 3 (three) times daily with meals. 21 tablet 0 Taking  . Insulin Glargine (LANTUS  SOLOSTAR) 100 UNIT/ML Solostar Pen Inject 500 Units into the skin every morning. Inject 150 units twice daily. 60 pen 11   . lisinopril (PRINIVIL,ZESTRIL) 10 MG tablet Take 1 tablet (10 mg total) by mouth daily. 30 tablet 5 Taking  . NITROSTAT 0.4 MG SL tablet DISSOLVE 1 TABLET UNDER THE TONGUE EVERY 5 MINUTES AS NEEDED FOR CHEST PAIN. 30 tablet 1 Taking  . nortriptyline (PAMELOR) 25 MG capsule Take 1 capsule (25 mg total) by mouth at bedtime. 30 capsule 5 Taking  . oxyCODONE-acetaminophen (PERCOCET/ROXICET) 5-325 MG per tablet Take 1 tablet by mouth every 6 (six) hours as needed for moderate pain or severe pain. 10 tablet 0 Taking  . pantoprazole (PROTONIX) 40 MG tablet Take 40 mg by mouth daily.   Taking  . SYMBICORT 160-4.5 MCG/ACT inhaler INHALE 2 PUFFS INTO LUNGS TWICE DAILY 10.2 g 4 Taking  . topiramate (TOPAMAX) 25 MG tablet TAKE 1 TABLET BY MOUTH EVERY NIGHT AT BEDTIME 30 tablet 3 Taking  . traMADol (ULTRAM) 50 MG tablet TAKE 1 TABLET BY MOUTH EVERY DAY AS NEEDED FOR MODERATE PAIN 15 tablet 2   . valACYclovir (VALTREX) 1000 MG tablet Take 1 tablet (1,000 mg total) by mouth 3 (three) times daily. 21 tablet 0 Taking    Review of Systems  Constitutional: Negative for fever, chills and malaise/fatigue.  Gastrointestinal: Negative for nausea and vomiting.  Genitourinary:       Light menstrual bleeding   Physical Exam   Blood pressure 136/81, pulse 97, temperature 97.9 F (36.6 C), resp. rate 18, height 5\' 2"  (1.575 m), weight 329 lb (149.233 kg), last menstrual period 01/27/2015.  Physical Exam  Constitutional: She is oriented to person, place, and time. She appears well-developed and well-nourished. No distress.  HENT:  Head: Normocephalic.  Cardiovascular: Normal rate.   Respiratory: Effort normal.  GI: Soft. There is no tenderness.  Genitourinary: Vagina normal. No vaginal discharge found.  Tiny blue/black papule, well defined edges, appears to be a mole or angioma, 76mm in  size There is also a small superficial laceration at introitus, about 2-3 mm long, c/w vaginal trauma due to dryness (had IC last night).   No other lesions. Vulva and vagina slightly pink but no overt erethema.  Musculoskeletal: Normal range of motion.  Neurological: She is alert and oriented to person, place, and time.  Skin: Skin is warm and dry.  Psychiatric: She has a normal mood and affect.    MAU Course  Procedures  MDM Results for orders placed or performed during the hospital encounter of 02/02/15 (from the past 24 hour(s))  Urinalysis, Routine w reflex microscopic     Status: Abnormal   Collection Time: 02/02/15 10:25 AM  Result Value Ref Range   Color, Urine YELLOW YELLOW   APPearance CLEAR CLEAR   Specific Gravity, Urine 1.015 1.005 - 1.030   pH 5.5 5.0 - 8.0   Glucose, UA >1000 (A) NEGATIVE mg/dL   Hgb urine dipstick LARGE (A) NEGATIVE   Bilirubin Urine NEGATIVE NEGATIVE   Ketones, ur 15 (A) NEGATIVE mg/dL   Protein, ur NEGATIVE NEGATIVE mg/dL   Urobilinogen, UA 0.2 0.0 - 1.0 mg/dL   Nitrite NEGATIVE NEGATIVE   Leukocytes, UA TRACE (A) NEGATIVE  Urine microscopic-add on     Status: Abnormal   Collection Time: 02/02/15 10:25 AM  Result Value Ref Range   Squamous Epithelial / LPF FEW (A) RARE   WBC, UA 0-2 <3 WBC/hpf   RBC / HPF 21-50 <3 RBC/hpf  Pregnancy, urine POC     Status: None   Collection Time: 02/02/15 10:35 AM  Result Value Ref Range   Preg Test, Ur NEGATIVE NEGATIVE  Wet prep, genital     Status: Abnormal   Collection Time: 02/02/15 10:50 AM  Result Value Ref Range   Yeast Wet Prep HPF POC NONE SEEN NONE SEEN   Trich, Wet Prep NONE SEEN NONE SEEN   Clue Cells Wet Prep HPF POC NONE SEEN NONE SEEN   WBC, Wet Prep HPF POC FEW (A) NONE SEEN     Assessment and Plan  A:  Menses      New vulvar lesion, not suspicious, most likely nevus or angioma      Small introitus laceration        P:  DIscussed with Dr Marvel Plan       Discharge home        Suggested using water based lubricant for intercourse, pelvic rest until this one heals       Followup with Dr Willis Modena        Hansel Feinstein 02/02/2015, 10:51 AM

## 2015-02-04 ENCOUNTER — Other Ambulatory Visit: Payer: Self-pay | Admitting: Family Medicine

## 2015-02-07 ENCOUNTER — Ambulatory Visit: Payer: Medicaid Other | Admitting: Endocrinology

## 2015-02-11 ENCOUNTER — Telehealth: Payer: Self-pay | Admitting: *Deleted

## 2015-02-11 NOTE — Telephone Encounter (Signed)
[facility] called to request NPI number.  Pt has an appt today for vaginal irrigation.  NPI number given x 1 visit.  Derl Barrow, RN

## 2015-02-16 IMAGING — CR DG ELBOW COMPLETE 3+V*R*
4 series · 4 of 4 positions shown · non-contrast
Comparison: Right forearm October 04, 2007

CLINICAL DATA: Pain

EXAM:
RIGHT ELBOW - COMPLETE 3+ VIEW

[x elbow joint ap right]
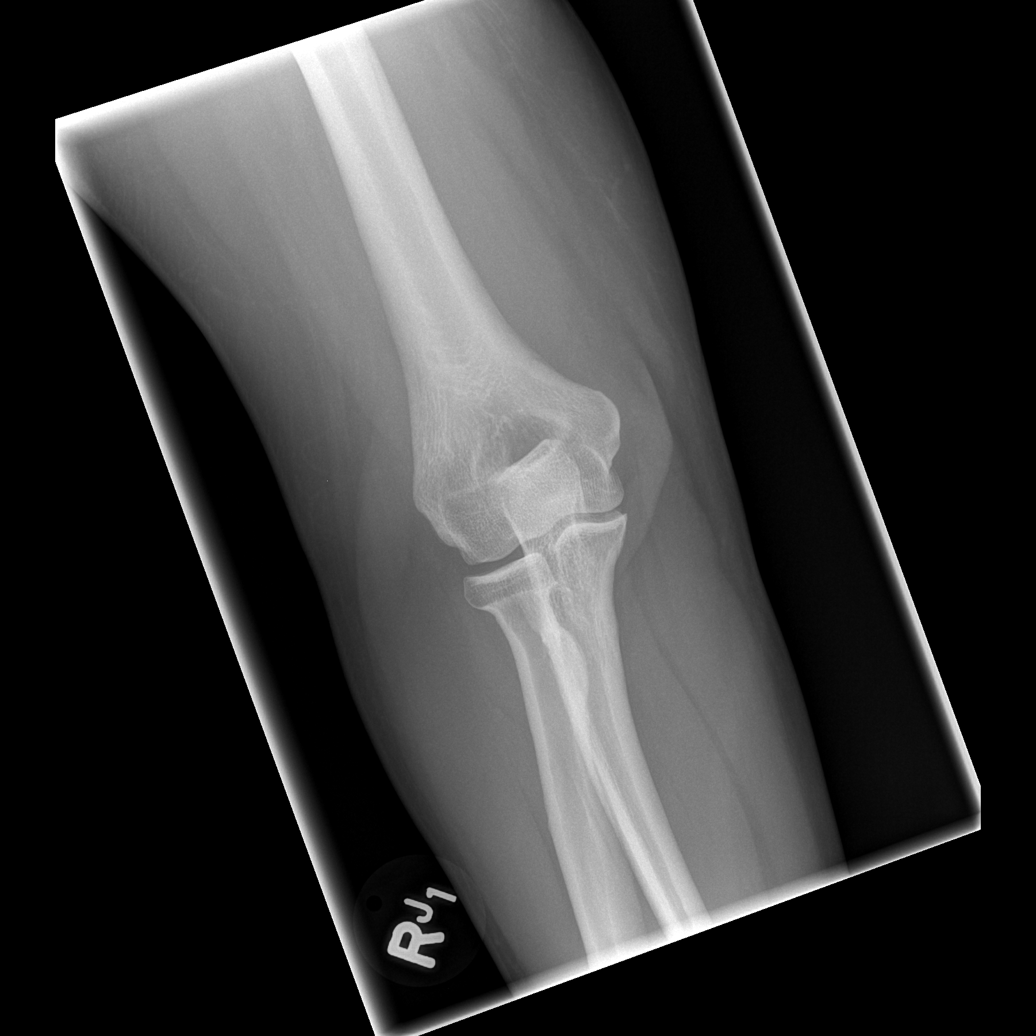

[x elbow joint obl. right (1 of 2)]
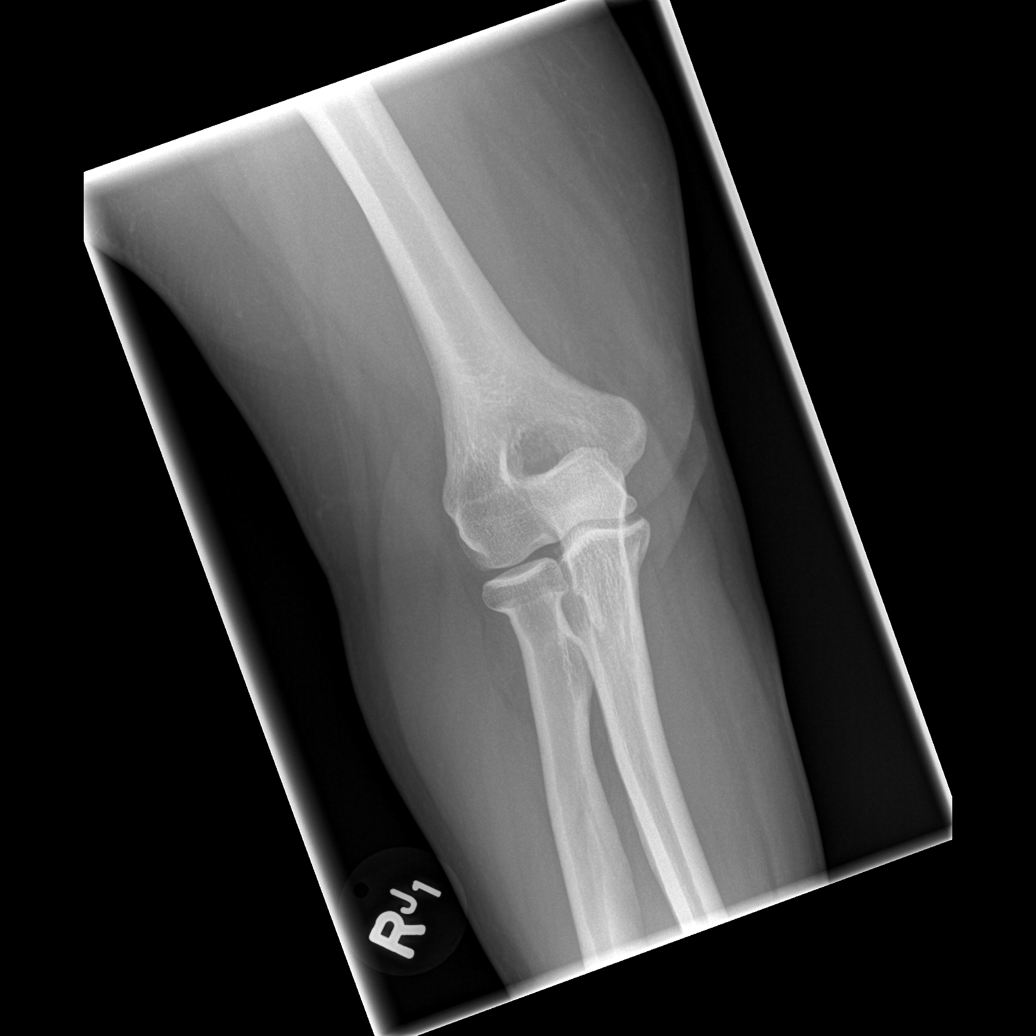

[x elbow joint obl. right (2 of 2)]
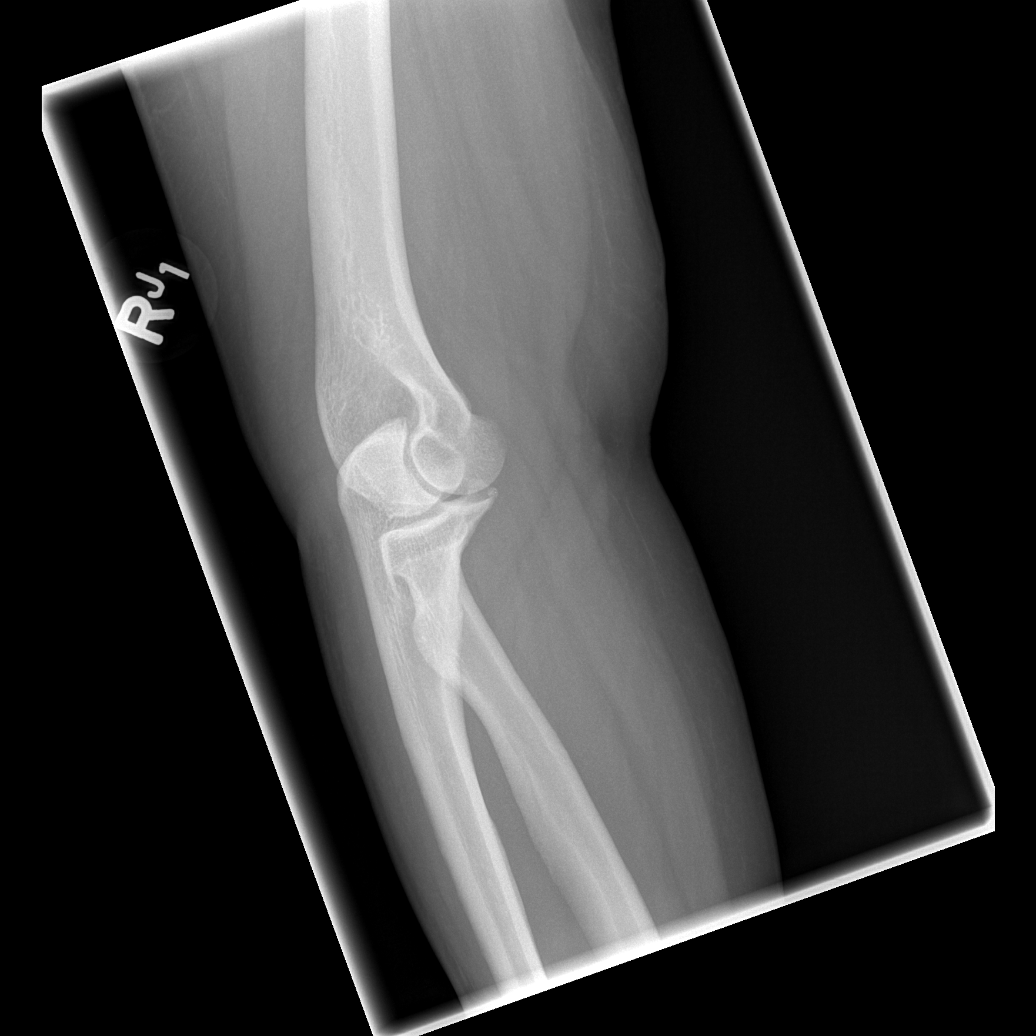

[x elbow joint lat right]
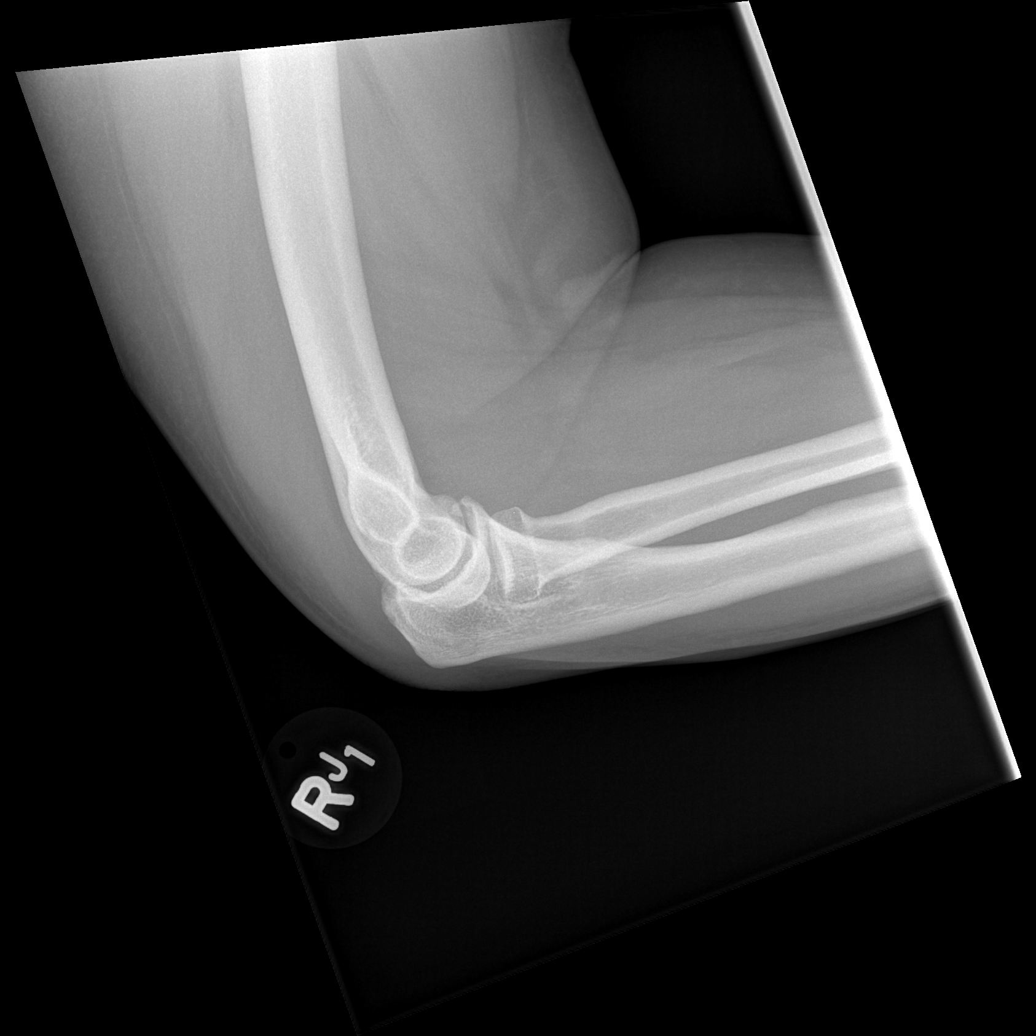

[4 of 4 positions shown; findings below may reference images not displayed]

FINDINGS: Frontal, lateral, and bilateral oblique views were obtained. A
linear sclerotic area in the proximal right radial metaphysis is
stable compared to the prior study and probably represents
prominence of the prior growth plate in this area. There is no acute
fracture or dislocation. No effusion. Joint spaces appear intact.
There is a minimal spur off the coracoid process of the proximal
ulna.
IMPRESSION: No fracture or joint effusion. Minimal spur arising from the
coracoid process of the proximal ulna.

## 2015-02-21 ENCOUNTER — Ambulatory Visit: Payer: Medicaid Other | Admitting: Endocrinology

## 2015-02-21 ENCOUNTER — Telehealth: Payer: Self-pay | Admitting: Endocrinology

## 2015-02-21 NOTE — Telephone Encounter (Signed)
Patient no showed today's appt. Please advise on how to follow up. °A. No follow up necessary. °B. Follow up urgent. Contact patient immediately. °C. Follow up necessary. Contact patient and schedule visit in ___ days. °D. Follow up advised. Contact patient and schedule visit in ____weeks. ° °

## 2015-02-21 NOTE — Telephone Encounter (Signed)
Follow up advised. Contact patient and schedule visit in 6 weeks. 

## 2015-02-24 ENCOUNTER — Ambulatory Visit: Payer: Medicaid Other | Admitting: Family Medicine

## 2015-02-24 NOTE — Telephone Encounter (Signed)
Pt rescheduled for 03/11/2015 at 4pm.

## 2015-02-26 ENCOUNTER — Ambulatory Visit (INDEPENDENT_AMBULATORY_CARE_PROVIDER_SITE_OTHER): Payer: Medicaid Other | Admitting: Family Medicine

## 2015-02-26 VITALS — BP 131/87 | HR 94 | Temp 97.7°F | Ht 62.0 in | Wt 319.1 lb

## 2015-02-26 DIAGNOSIS — L259 Unspecified contact dermatitis, unspecified cause: Secondary | ICD-10-CM

## 2015-02-26 DIAGNOSIS — L290 Pruritus ani: Secondary | ICD-10-CM

## 2015-02-26 DIAGNOSIS — G629 Polyneuropathy, unspecified: Secondary | ICD-10-CM

## 2015-02-26 MED ORDER — HYDROCORTISONE 0.5 % EX CREA: 1.0000 "application " | TOPICAL_CREAM | Freq: Two times a day (BID) | CUTANEOUS | Status: DC | PRN

## 2015-02-26 MED ORDER — HYDROCORTISONE 2.5 % RE CREA: 1.0000 "application " | TOPICAL_CREAM | Freq: Two times a day (BID) | RECTAL | Status: DC

## 2015-02-26 MED ORDER — GABAPENTIN 300 MG PO CAPS: ORAL_CAPSULE | ORAL | Status: DC

## 2015-02-26 NOTE — Patient Instructions (Signed)
I refilled your rectal hydrocortisone I also give you a prescription for hydrocortisone for your feet Increase gabapentin to 1200 mg before you go to bed  Follow-up with me in 2 months or sooner if you need to.

## 2015-02-26 NOTE — Progress Notes (Signed)
I was preceptor the day of this visit.   

## 2015-02-26 NOTE — Progress Notes (Signed)
Patient ID: [name], female   DOB: 10/15/1976, 39 y.o.   MRN: [id number]  HPI:  Rash on feet: Recently began noticing a rash on her bilateral feet. She got new shoes over the winter, and recently wore them for the first time without socks on. She's not had any problems with them until she wore them without socks. She has a pruritic rash on her feet. Would like something to help with itch relief. The rash is in the exact distribution of the shoes.  Rectal itching: Thinks her hemorrhoids are acting up, worsened due to recent yeast infection. Has not had any rectal bleeding. Is stooling normally. Requests refill of hydrocortisone rectal cream.  Tingling in feet: Increasing tingling and numbness of her feet at night she lays down. Currently taking gabapentin 900 mg 3 times a day. Willing to increase her bedtime dose.  ROS: See HPI.  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 She was having to work in the middle the night, which did not work well with her schedule. She has plans to meet with somebody next week to look into a new job.  PHYSICAL EXAM: BP 131/87 mmHg  Pulse 94  Temp(Src) 97.7 F (36.5 C) (Oral)  Ht 5\' 2"  (1.575 m)  Wt 319 lb 1.6 oz (144.743 kg)  BMI 58.35 kg/m2  LMP 02/10/2015 (Approximate) Gen: No acute distress, pleasant, cooperative HEENT: Normocephalic, atraumatic Lungs: Normal respiratory effort Neuro: Grossly nonfocal, speech normal Ext: Maculopapular rash to lateral and medial aspect of bilateral feet, in distribution of shoes patient reports having worn. 2+ DP pulses bilaterally  ASSESSMENT/PLAN:  Rectal itching Flare up of hemorrhoids due to recent vulvar yeast infection. Rectal hydrocortisone refilled today. Follow-up if not improving.   Tingling in feet Known poorly controlled  diabetes, so this is likely related to diabetic peripheral neuropathy. She is already on gabapentin. I will increase her nighttime dose to 1200 mg. She will follow-up if this is not improving, otherwise in 2 months.  Rash on feet Consistent with contact dermatitis from fur-lined shoes. Prescribed hydrocortisone cream for her. Counseled her to wear socks with the shoes. Follow-up if not improving.  FOLLOW UP: F/u in 2 months for routine medical problems  [name], [place]

## 2015-02-26 NOTE — Assessment & Plan Note (Signed)
Flare up of hemorrhoids due to recent vulvar yeast infection. Rectal hydrocortisone refilled today. Follow-up if not improving.

## 2015-03-03 ENCOUNTER — Emergency Department (HOSPITAL_COMMUNITY)
Admission: EM | Admit: 2015-03-03 | Discharge: 2015-03-03 | Disposition: A | Discharge status: Home / Self Care | Payer: Medicaid Other | Attending: Emergency Medicine | Admitting: Emergency Medicine

## 2015-03-03 ENCOUNTER — Encounter (HOSPITAL_COMMUNITY): Payer: Self-pay | Admitting: Emergency Medicine

## 2015-03-03 DIAGNOSIS — J449 Chronic obstructive pulmonary disease, unspecified: Secondary | ICD-10-CM | POA: Diagnosis not present

## 2015-03-03 DIAGNOSIS — I10 Essential (primary) hypertension: Secondary | ICD-10-CM | POA: Insufficient documentation

## 2015-03-03 DIAGNOSIS — Z87891 Personal history of nicotine dependence: Secondary | ICD-10-CM | POA: Diagnosis not present

## 2015-03-03 DIAGNOSIS — Z794 Long term (current) use of insulin: Secondary | ICD-10-CM | POA: Insufficient documentation

## 2015-03-03 DIAGNOSIS — Z862 Personal history of diseases of the blood and blood-forming organs and certain disorders involving the immune mechanism: Secondary | ICD-10-CM | POA: Insufficient documentation

## 2015-03-03 DIAGNOSIS — E1165 Type 2 diabetes mellitus with hyperglycemia: Secondary | ICD-10-CM | POA: Insufficient documentation

## 2015-03-03 DIAGNOSIS — Z7951 Long term (current) use of inhaled steroids: Secondary | ICD-10-CM | POA: Insufficient documentation

## 2015-03-03 DIAGNOSIS — F3181 Bipolar II disorder: Secondary | ICD-10-CM | POA: Insufficient documentation

## 2015-03-03 DIAGNOSIS — M1990 Unspecified osteoarthritis, unspecified site: Secondary | ICD-10-CM | POA: Diagnosis not present

## 2015-03-03 DIAGNOSIS — E785 Hyperlipidemia, unspecified: Secondary | ICD-10-CM | POA: Diagnosis not present

## 2015-03-03 DIAGNOSIS — M79672 Pain in left foot: Secondary | ICD-10-CM | POA: Diagnosis present

## 2015-03-03 DIAGNOSIS — Z7952 Long term (current) use of systemic steroids: Secondary | ICD-10-CM | POA: Insufficient documentation

## 2015-03-03 DIAGNOSIS — Z9889 Other specified postprocedural states: Secondary | ICD-10-CM | POA: Diagnosis not present

## 2015-03-03 DIAGNOSIS — G629 Polyneuropathy, unspecified: Secondary | ICD-10-CM | POA: Diagnosis not present

## 2015-03-03 DIAGNOSIS — Z79899 Other long term (current) drug therapy: Secondary | ICD-10-CM | POA: Diagnosis not present

## 2015-03-03 DIAGNOSIS — Z872 Personal history of diseases of the skin and subcutaneous tissue: Secondary | ICD-10-CM | POA: Diagnosis not present

## 2015-03-03 DIAGNOSIS — K219 Gastro-esophageal reflux disease without esophagitis: Secondary | ICD-10-CM | POA: Diagnosis not present

## 2015-03-03 DIAGNOSIS — R7309 Other abnormal glucose: Secondary | ICD-10-CM

## 2015-03-03 LAB — CBG MONITORING, ED: GLUCOSE-CAPILLARY: 392 mg/dL — AB (ref 70–99)

## 2015-03-03 MED ORDER — HYDROCODONE-ACETAMINOPHEN 5-325 MG PO TABS
1.0000 | ORAL_TABLET | Freq: Once | ORAL | Status: AC
  Administered 2015-03-03: 1 via ORAL
  Filled 2015-03-03: qty 1

## 2015-03-03 MED ORDER — INSULIN ASPART 100 UNIT/ML ~~LOC~~ SOLN
10.0000 [IU] | Freq: Once | SUBCUTANEOUS | Status: AC
  Administered 2015-03-03: 10 [IU] via SUBCUTANEOUS
  Filled 2015-03-03: qty 1

## 2015-03-03 MED ORDER — HYDROCODONE-ACETAMINOPHEN 5-325 MG PO TABS: 1.0000 | ORAL_TABLET | ORAL | Status: DC | PRN

## 2015-03-03 NOTE — ED Notes (Signed)
Pt reports foot pain x3 weeks, increased Gabapentin per PCP. Pt states tonight pain began "to feel like they are tingling, numb, and on fire." Pt able to ambulate without difficulty. Reports hx DM, with "out of whack" CBG at home.

## 2015-03-03 NOTE — Discharge Instructions (Signed)
Peripheral Neuropathy °Peripheral neuropathy is a type of nerve damage. It affects nerves that carry signals between the spinal cord and other parts of the body. These are called peripheral nerves. With peripheral neuropathy, one nerve or a group of nerves may be damaged.  °CAUSES  °Many things can damage peripheral nerves. For some people with peripheral neuropathy, the cause is unknown. Some causes include: °· Diabetes. This is the most common cause of peripheral neuropathy. °· Injury to a nerve. °· Pressure or stress on a nerve that lasts a long time. °· Too little vitamin B. Alcoholism can lead to this. °· Infections. °· Autoimmune diseases, such as multiple sclerosis and systemic lupus erythematosus. °· Inherited nerve diseases. °· Some medicines, such as cancer drugs. °· Toxic substances, such as lead and mercury. °· Too little blood flowing to the legs. °· Kidney disease. °· Thyroid disease. °SIGNS AND SYMPTOMS  °Different people have different symptoms. The symptoms you have will depend on which of your nerves is damaged.  Common symptoms include: °· Loss of feeling (numbness) in the feet and hands. °· Tingling in the feet and hands. °· Pain that burns. °· Very sensitive skin. °· Weakness. °· Not being able to move a part of the body (paralysis). °· Muscle twitching. °· Clumsiness or poor coordination. °· Loss of balance. °· Not being able to control your bladder. °· Feeling dizzy. °· Sexual problems. °DIAGNOSIS  °Peripheral neuropathy is a symptom, not a disease. Finding the cause of peripheral neuropathy can be hard. To figure that out, your health care provider will take a medical history and do a physical exam. A neurological exam will also be done. This involves checking things affected by your brain, spinal cord, and nerves (nervous system). For example, your health care provider will check your reflexes, how you move, and what you can feel.  °Other types of tests may also be ordered, such as: °· Blood  tests. °· A test of the fluid in your spinal cord. °· Imaging tests, such as CT scans or an MRI. °· Electromyography (EMG). This test checks the nerves that control muscles. °· Nerve conduction velocity tests. These tests check how fast messages pass through your nerves. °· Nerve biopsy. A small piece of nerve is removed. It is then checked under a microscope. °TREATMENT  °· Medicine is often used to treat peripheral neuropathy. Medicines may include: °· Pain-relieving medicines. Prescription or over-the-counter medicine may be suggested. °· Antiseizure medicine. This may be used for pain. °· Antidepressants. These also may help ease pain from neuropathy. °· Lidocaine. This is a numbing medicine. You might wear a patch or be given a shot. °· Mexiletine. This medicine is typically used to help control irregular heart rhythms. °· Surgery. Surgery may be needed to relieve pressure on a nerve or to destroy a nerve that is causing pain. °· Physical therapy to help movement. °· Assistive devices to help movement. °HOME CARE INSTRUCTIONS  °· Only take over-the-counter or prescription medicines as directed by your health care provider. Follow the instructions carefully for any given medicines. Do not take any other medicines without first getting approval from your health care provider. °· If you have diabetes, work closely with your health care provider to keep your blood sugar under control. °· If you have numbness in your feet: °· Check every day for signs of injury or infection. Watch for redness, warmth, and swelling. °· Wear padded socks and comfortable shoes. These help protect your feet. °· Do not do   things that put pressure on your damaged nerve.  Do not smoke. Smoking keeps blood from getting to damaged nerves.  Avoid or limit alcohol. Too much alcohol can cause a lack of B vitamins. These vitamins are needed for healthy nerves.  Develop a good support system. Coping with peripheral neuropathy can be  stressful. Talk to a mental health specialist or join a support group if you are struggling.  Follow up with your health care provider as directed. SEEK MEDICAL CARE IF:   You have new signs or symptoms of peripheral neuropathy.  You are struggling emotionally from dealing with peripheral neuropathy.  You have a fever. SEEK IMMEDIATE MEDICAL CARE IF:   You have an injury or infection that is not healing.  You feel very dizzy or begin vomiting.  You have chest pain.  You have trouble breathing. Document Released: 11/12/2002 Document Revised: 08/04/2011 Document Reviewed: 07/30/2013 [facility] Patient Information 2015 [place], [place]. This information is not intended to replace advice given to you by your health care provider. Make sure you discuss any questions you have with your health care provider.  Hyperglycemia Hyperglycemia occurs when the glucose (sugar) in your blood is too high. Hyperglycemia can happen for many reasons, but it most often happens to people who do not know they have diabetes or are not managing their diabetes properly.  CAUSES  Whether you have diabetes or not, there are other causes of hyperglycemia. Hyperglycemia can occur when you have diabetes, but it can also occur in other situations that you might not be as aware of, such as: Diabetes  If you have diabetes and are having problems controlling your blood glucose, hyperglycemia could occur because of some of the following reasons:  Not following your meal plan.  Not taking your diabetes medications or not taking it properly.  Exercising less or doing less activity than you normally do.  Being sick. Pre-diabetes  This cannot be ignored. Before people develop Type 2 diabetes, they almost always have "pre-diabetes." This is when your blood glucose levels are higher than normal, but not yet high enough to be diagnosed as diabetes. Research has shown that some long-term damage to the body, especially the  heart and circulatory system, may already be occurring during pre-diabetes. If you take action to manage your blood glucose when you have pre-diabetes, you may delay or prevent Type 2 diabetes from developing. Stress  If you have diabetes, you may be "diet" controlled or on oral medications or insulin to control your diabetes. However, you may find that your blood glucose is higher than usual in the hospital whether you have diabetes or not. This is often referred to as "stress hyperglycemia." Stress can elevate your blood glucose. This happens because of hormones put out by the body during times of stress. If stress has been the cause of your high blood glucose, it can be followed regularly by your caregiver. That way he/she can make sure your hyperglycemia does not continue to get worse or progress to diabetes. Steroids  Steroids are medications that act on the infection fighting system (immune system) to block inflammation or infection. One side effect can be a rise in blood glucose. Most people can produce enough extra insulin to allow for this rise, but for those who cannot, steroids make blood glucose levels go even higher. It is not unusual for steroid treatments to "uncover" diabetes that is developing. It is not always possible to determine if the hyperglycemia will go away after the steroids  are stopped. A special blood test called an A1c is sometimes done to determine if your blood glucose was elevated before the steroids were started. SYMPTOMS  Thirsty.  Frequent urination.  Dry mouth.  Blurred vision.  Tired or fatigue.  Weakness.  Sleepy.  Tingling in feet or leg. DIAGNOSIS  Diagnosis is made by monitoring blood glucose in one or all of the following ways:  A1c test. This is a chemical found in your blood.  Fingerstick blood glucose monitoring.  Laboratory results. TREATMENT  First, knowing the cause of the hyperglycemia is important before the hyperglycemia can be  treated. Treatment may include, but is not be limited to:  Education.  Change or adjustment in medications.  Change or adjustment in meal plan.  Treatment for an illness, infection, etc.  More frequent blood glucose monitoring.  Change in exercise plan.  Decreasing or stopping steroids.  Lifestyle changes. HOME CARE INSTRUCTIONS   Test your blood glucose as directed.  Exercise regularly. Your caregiver will give you instructions about exercise. Pre-diabetes or diabetes which comes on with stress is helped by exercising.  Eat wholesome, balanced meals. Eat often and at regular, fixed times. Your caregiver or nutritionist will give you a meal plan to guide your sugar intake.  Being at an ideal weight is important. If needed, losing as little as 10 to 15 pounds may help improve blood glucose levels. SEEK MEDICAL CARE IF:   You have questions about medicine, activity, or diet.  You continue to have symptoms (problems such as increased thirst, urination, or weight gain). SEEK IMMEDIATE MEDICAL CARE IF:   You are vomiting or have diarrhea.  Your breath smells fruity.  You are breathing faster or slower.  You are very sleepy or incoherent.  You have numbness, tingling, or pain in your feet or hands.  You have chest pain.  Your symptoms get worse even though you have been following your caregiver's orders.  If you have any other questions or concerns. Document Released: 05/18/2001 Document Revised: 02/14/2012 Document Reviewed: 03/20/2012 [facility] Patient Information 2015 [place], [place]. This information is not intended to replace advice given to you by your health care provider. Make sure you discuss any questions you have with your health care provider.

## 2015-03-03 NOTE — ED Provider Notes (Signed)
CSN: [id number]     Arrival date & time 03/03/15  0225 History   First MD Initiated Contact with Patient 03/03/15 0236     Chief Complaint  Patient presents with  . Foot Pain     (Consider location/radiation/quality/duration/timing/severity/associated sxs/prior Treatment) HPI Patient presents with 3 weeks of burning pain to bilateral feet. Pains in a stocking pattern from the ankles distally. No trauma. Recently complained of decreased sensation as well. Has been on gabapentin and primary physician recently increased to 1200 mg every evening.. Patient states she is not taking this amount due to increased daytime drowsiness. No back pain. No focal weakness. Patient has a history of diabetes for which her blood glucose remains uncontrolled. States her sugar typically is greater than 400. She is followed by an endocrinologist. Past Medical History  Diagnosis Date  . Morbid obesity   . Alveolar hypoventilation   . Asthma   . GERD (gastroesophageal reflux disease)   . Rectal fissure   . Hypertension   . Costochondritis   . Cellulitis 08/2010-08/2011  . Depression   . Bipolar 2 disorder   . Obstructive sleep apnea   . Chest pain     NO CAD by cath 1/88/41, nl LV systolic fxn  . Diabetes mellitus 2000    Type 2, Uncontrolled  . Arthritis   . HLD (hyperlipidemia)   . Anemia   . COPD (chronic obstructive pulmonary disease)    Past Surgical History  Procedure Laterality Date  . Carpal tunnel release    . Bil knee surgery    . Mass excision N/A 06/29/2013    Procedure:  WIDE LOCAL EXCISION OF POSTERIOR NECK ABSCESS;  Surgeon: Ralene Ok, MD;  Location: [place] [facility];  Service: General;  Laterality: N/A;  . Cardiac catheterization    . Left heart catheterization with coronary angiogram N/A 07/27/2012    Procedure: LEFT HEART CATHETERIZATION WITH CORONARY ANGIOGRAM;  Surgeon: Sherren Mocha, MD;  Location: [facility] CATH LAB;  Service: Cardiovascular;  Laterality: N/A;   Family History  Problem  Relation Age of Onset  . Diabetes Mother   . Hyperlipidemia Mother   . Depression Mother   . Heart attack Paternal Uncle   . Heart disease Paternal Grandmother   . Heart attack Paternal Grandmother   . Heart attack Paternal Grandfather   . Heart disease Paternal Grandfather    History  Substance Use Topics  . Smoking status: Former Smoker -- 0.30 packs/day for .3 years    Types: Cigarettes    Quit date: 12/06/1993  . Smokeless tobacco: Never Used  . Alcohol Use: No   OB History    No data available     Review of Systems  Constitutional: Negative for fever and chills.  Cardiovascular: Negative for leg swelling.  Gastrointestinal: Negative for nausea and vomiting.  Musculoskeletal: Negative for back pain.  Skin: Negative for rash.  Neurological: Positive for numbness. Negative for weakness.  All other systems reviewed and are negative.     Allergies  Kiwi extract and Nubain  Home Medications   Prior to Admission medications   Medication Sig Start Date End Date Taking? Authorizing Provider  ACCU-CHEK FASTCLIX LANCETS MISC Use to check blood sugar 2 times per day. 10/07/14   [name], MD  albuterol (PROVENTIL HFA;VENTOLIN HFA) 108 (90 BASE) MCG/ACT inhaler Inhale 1 puff into the lungs every 6 (six) hours as needed for wheezing or shortness of breath. 06/11/14   [name], MD  atorvastatin (LIPITOR) 20 MG tablet  Take 20 mg by mouth daily.    Historical Provider, MD  carvedilol (COREG) 12.5 MG tablet TAKE 1 TABLET BY MOUTH TWICE DAILY WITH A MEAL 02/05/15   [name], MD  cetirizine (ZYRTEC) 10 MG tablet Take 10 mg by mouth daily as needed for allergies.    Historical Provider, MD  clotrimazole (LOTRIMIN) 1 % cream APPLY TOPICALLY 2 TIMES A DAY AS NEEDED FOR SKIN YEAST 01/03/15   [name], MD  cyclobenzaprine (FLEXERIL) 10 MG tablet Take 1 tablet (10 mg total) by mouth 2 (two) times daily as needed for muscle spasms. 01/31/15   [name],  MD  fluticasone (FLONASE) 50 MCG/ACT nasal spray Place 2 sprays into both nostrils daily. 05/21/14   Melony Overly, MD  gabapentin (NEURONTIN) 300 MG capsule Take 900mg  in morning and afternoon. Take 1200mg  in evening. 02/26/15   [name], MD  HYDROcodone-acetaminophen [facility]) 5-325 MG per tablet Take 1-2 tablets by mouth every 4 (four) hours as needed for severe pain. 03/03/15   Julianne Rice, MD  hydrocortisone (ANUSOL-HC) 2.5 % rectal cream Place 1 application rectally 2 (two) times daily. 02/26/15   [name], MD  hydrocortisone cream 0.5 % Apply 1 application topically 2 (two) times daily as needed for itching. To feet 02/26/15   [name], MD  ibuprofen (ADVIL,MOTRIN) 800 MG tablet Take 1 tablet (800 mg total) by mouth 3 (three) times daily with meals. 12/29/14   [name], PA-C  Insulin Glargine (LANTUS SOLOSTAR) 100 UNIT/ML Solostar Pen Inject 500 Units into the skin every morning. Inject 150 units twice daily. Patient taking differently: Inject 500 Units into the skin every morning.  01/27/15   [name], MD  lisinopril (PRINIVIL,ZESTRIL) 10 MG tablet Take 1 tablet (10 mg total) by mouth daily. 03/12/14   [name], MD  NITROSTAT 0.4 MG SL tablet DISSOLVE 1 TABLET UNDER THE TONGUE EVERY 5 MINUTES AS NEEDED FOR CHEST PAIN. 11/13/14   [name], MD  nortriptyline (PAMELOR) 25 MG capsule Take 1 capsule (25 mg total) by mouth at bedtime. 12/17/14   Bryan R Hess, DO  pantoprazole (PROTONIX) 40 MG tablet Take 40 mg by mouth daily.    Historical Provider, MD  SYMBICORT 160-4.5 MCG/ACT inhaler INHALE 2 PUFFS INTO LUNGS TWICE DAILY 10/18/14   [name], MD  topiramate (TOPAMAX) 25 MG tablet TAKE 1 TABLET BY MOUTH EVERY NIGHT AT BEDTIME Patient taking differently: Take 25 mg by mouth at bedtime.  10/14/14   [name], MD  traMADol (ULTRAM) 50 MG tablet TAKE 1 TABLET BY MOUTH EVERY DAY AS NEEDED FOR MODERATE PAIN Patient taking  differently: Take 50 mg by mouth daily as needed for moderate pain.  01/31/15   [name], MD   BP 153/94 mmHg  Pulse 84  Temp(Src) 98.9 F (37.2 C) (Oral)  Resp 20  SpO2 98%  LMP 02/10/2015 (Approximate) Physical Exam  Constitutional: She is oriented to person, place, and time. She appears well-developed and well-nourished. No distress.  Obese  HENT:  Head: Normocephalic and atraumatic.  Mouth/Throat: Oropharynx is clear and moist.  Eyes: EOM are normal. Pupils are equal, round, and reactive to light.  Neck: Normal range of motion. Neck supple.  Cardiovascular: Normal rate and regular rhythm.   Pulmonary/Chest: Effort normal and breath sounds normal. No respiratory distress. She has no wheezes. She has no rales.  Abdominal: Soft. Bowel sounds are normal. She exhibits no distension and  no mass. There is no tenderness. There is no rebound and no guarding.  Musculoskeletal: Normal range of motion. She exhibits no edema or tenderness.  2+ dorsalis pedis and posterior tibial pulses in bilateral feet. No obvious swelling. No trauma or deformity.  Neurological: She is alert and oriented to person, place, and time.  5/5 motor in all extremities. Subjective decreased sensation in bilateral feet stocking pattern.  Skin: Skin is warm and dry. No rash noted. No erythema.  Psychiatric: She has a normal mood and affect. Her behavior is normal.  Nursing note and vitals reviewed.   ED Course  Procedures (including critical care time) Labs Review Labs Reviewed  CBG MONITORING, ED    Imaging Review No results found.   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 Elevated blood sugar in the emergency department. I am insulin given. Will give pain medication and have encouraged follow-up with patient's primary physician as well as her  endocrinologist. May need to have amitriptyline added to her regimen given her intolerance for higher doses of gabapentin. Discussed at length with patient and agrees with plan.    Julianne Rice, MD 03/03/15 [phone]

## 2015-03-04 ENCOUNTER — Other Ambulatory Visit: Payer: Self-pay | Admitting: Family Medicine

## 2015-03-11 ENCOUNTER — Ambulatory Visit: Payer: Medicaid Other | Admitting: Endocrinology

## 2015-04-01 ENCOUNTER — Other Ambulatory Visit: Payer: Self-pay | Admitting: Family Medicine

## 2015-04-01 ENCOUNTER — Other Ambulatory Visit: Payer: Self-pay | Admitting: Endocrinology

## 2015-04-01 NOTE — Telephone Encounter (Signed)
Please advise if ok to refill Rx. Medication is not on current list.  Thanks! 

## 2015-04-02 ENCOUNTER — Other Ambulatory Visit: Payer: Self-pay | Admitting: Family Medicine

## 2015-04-03 MED ORDER — NITROGLYCERIN 0.4 MG SL SUBL: SUBLINGUAL_TABLET | SUBLINGUAL | Status: DC

## 2015-04-08 ENCOUNTER — Ambulatory Visit: Payer: Medicaid Other | Admitting: Neurology

## 2015-04-09 ENCOUNTER — Encounter: Payer: Self-pay | Admitting: Neurology

## 2015-04-09 ENCOUNTER — Telehealth: Payer: Self-pay | Admitting: Neurology

## 2015-04-09 NOTE — Telephone Encounter (Signed)
Error

## 2015-04-22 IMAGING — CR DG CHEST 2V
2 series · 2 of 2 positions shown · non-contrast
Comparison: DG CHEST 2 VIEW dated 06/27/2013

CLINICAL DATA: CHEST PAIN

EXAM:
CHEST  2 VIEW

[w chest pa]
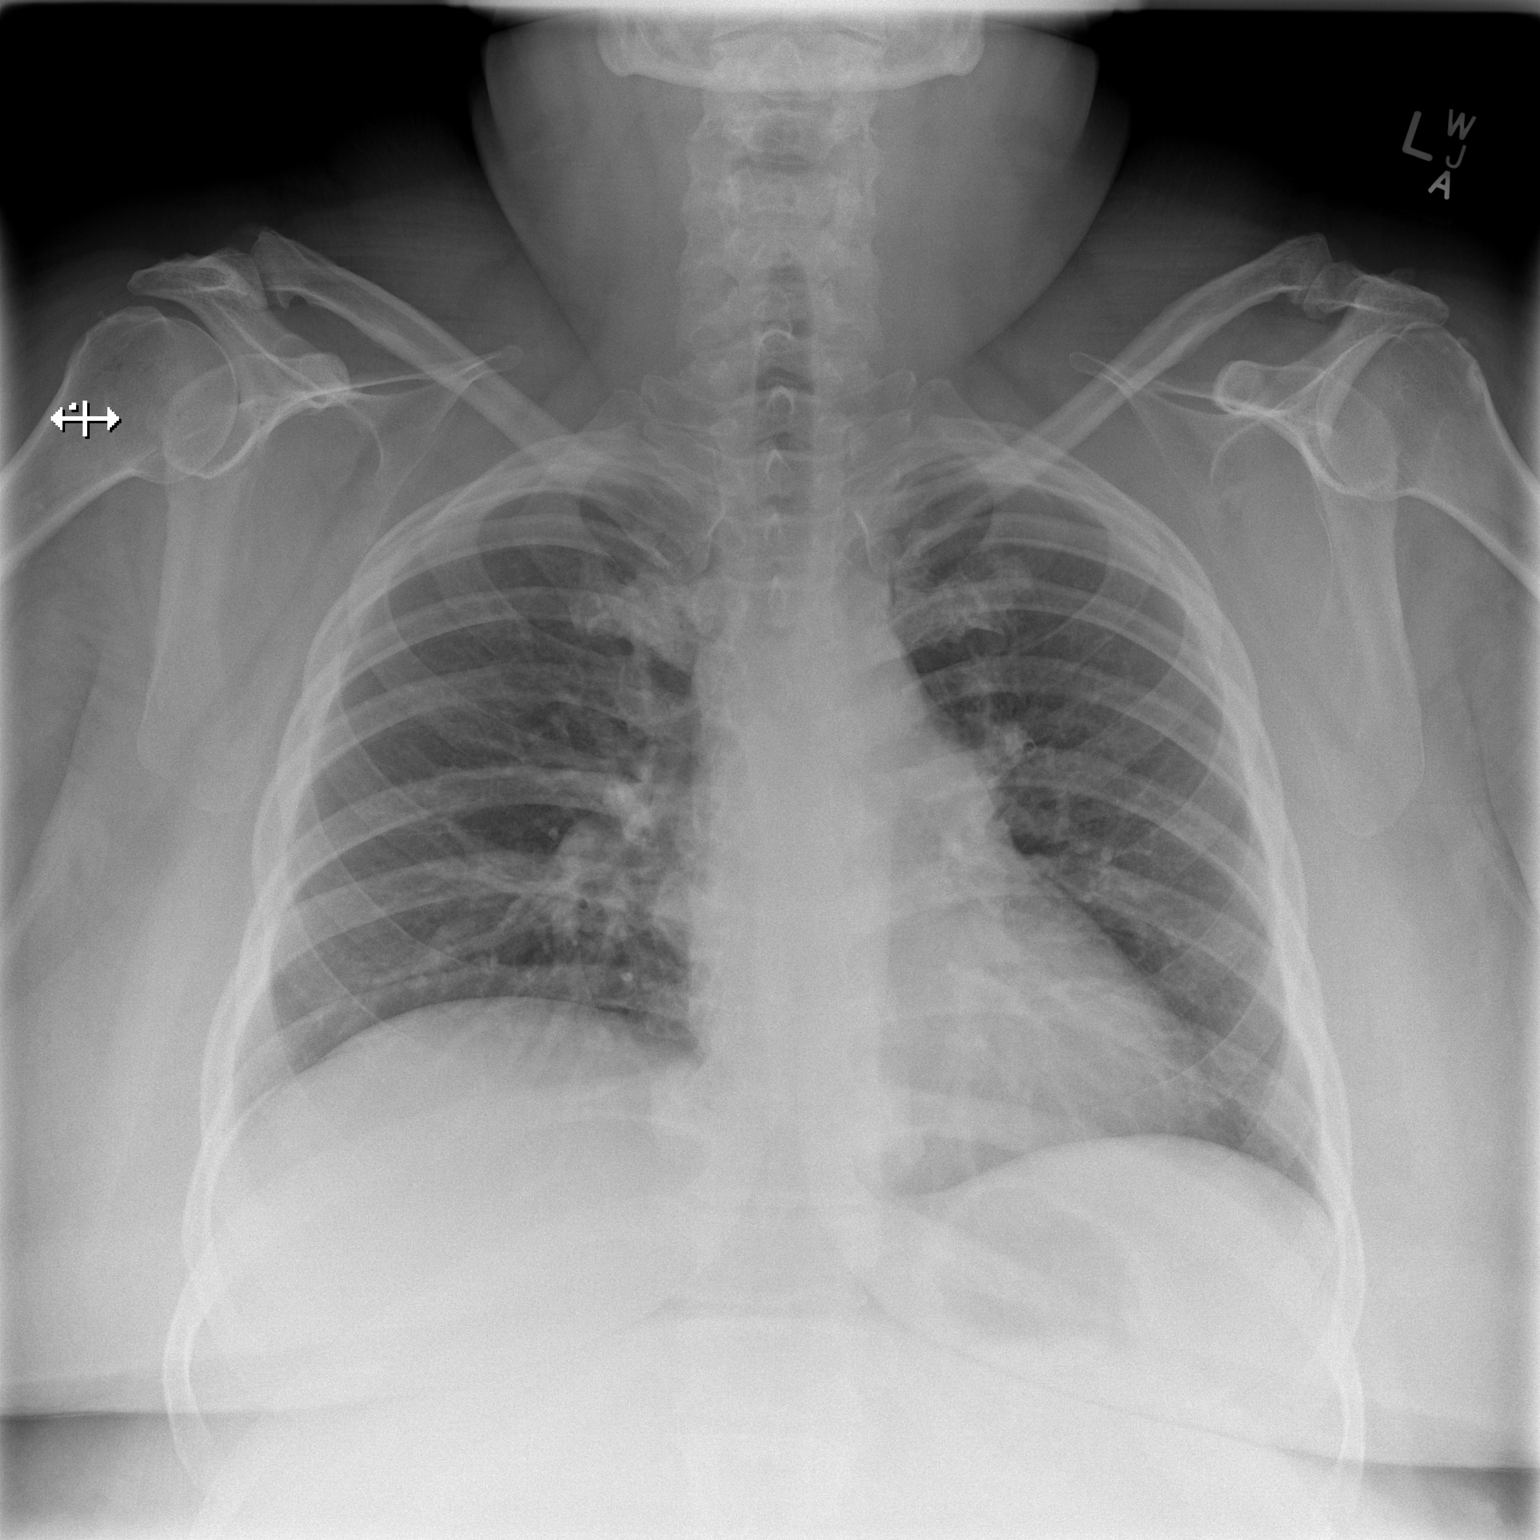

[w chest lat]
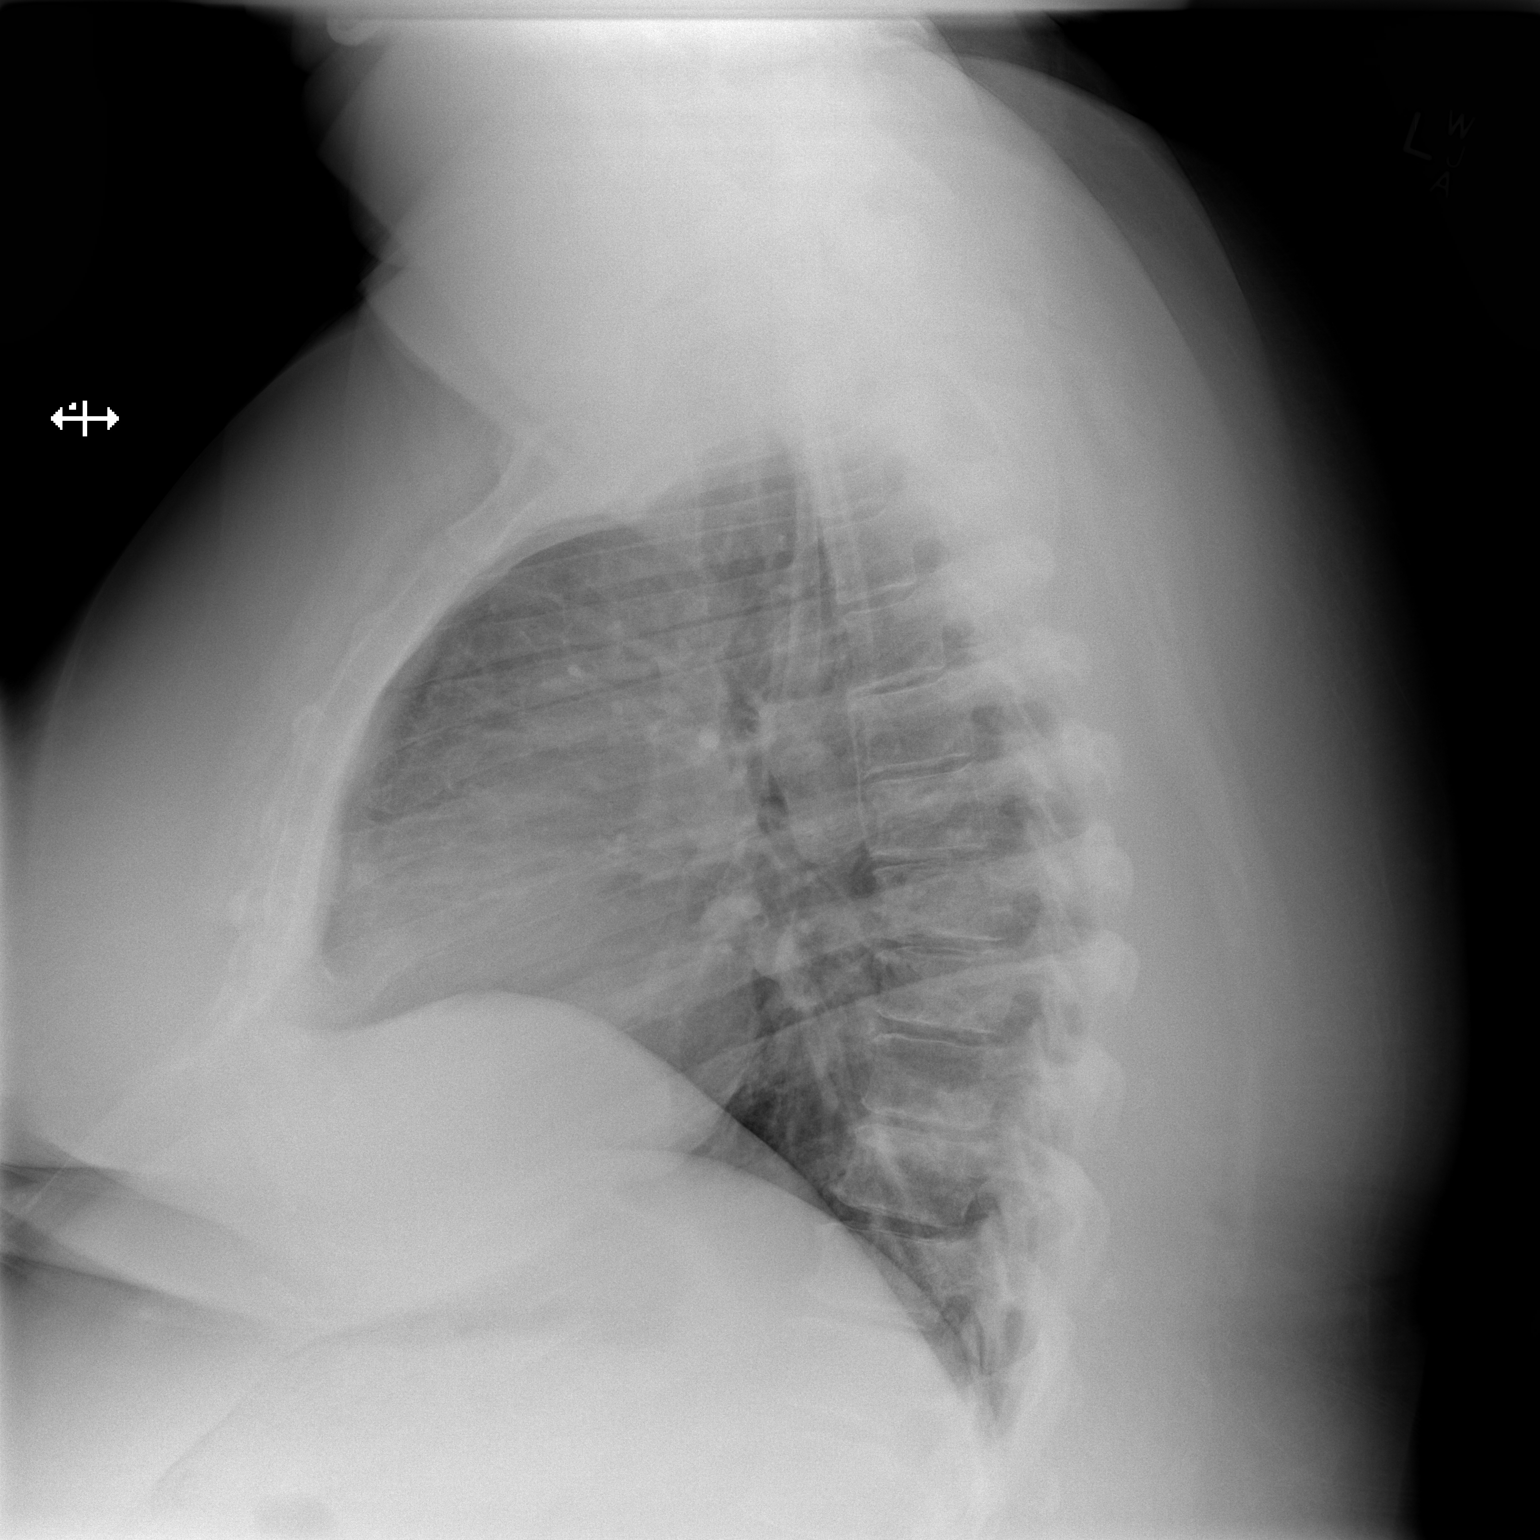

[2 of 2 positions shown; findings below may reference images not displayed]

FINDINGS: Cardiomediastinal silhouette is unremarkable. The lungs are clear
without pleural effusions or focal consolidations. Persistent mildly
elevated right hemidiaphragm. Trachea projects midline and there is
no pneumothorax. Soft tissue planes and included osseous structures
are non-suspicious. Mild degenerative change of the thoracic spine.
IMPRESSION: No active cardiopulmonary disease.

  By: Apple Tiger

## 2015-04-28 ENCOUNTER — Encounter: Payer: Self-pay | Admitting: Family Medicine

## 2015-04-28 ENCOUNTER — Ambulatory Visit (INDEPENDENT_AMBULATORY_CARE_PROVIDER_SITE_OTHER): Payer: Medicaid Other | Admitting: Family Medicine

## 2015-04-28 VITALS — BP 126/85 | HR 102 | Temp 98.2°F | Ht 62.0 in | Wt 322.0 lb

## 2015-04-28 DIAGNOSIS — I10 Essential (primary) hypertension: Secondary | ICD-10-CM | POA: Diagnosis not present

## 2015-04-28 DIAGNOSIS — IMO0002 Reserved for concepts with insufficient information to code with codable children: Secondary | ICD-10-CM

## 2015-04-28 DIAGNOSIS — R51 Headache: Secondary | ICD-10-CM | POA: Diagnosis not present

## 2015-04-28 DIAGNOSIS — L089 Local infection of the skin and subcutaneous tissue, unspecified: Secondary | ICD-10-CM | POA: Diagnosis not present

## 2015-04-28 DIAGNOSIS — G580 Intercostal neuropathy: Secondary | ICD-10-CM

## 2015-04-28 DIAGNOSIS — Z7189 Other specified counseling: Secondary | ICD-10-CM | POA: Diagnosis not present

## 2015-04-28 DIAGNOSIS — G2581 Restless legs syndrome: Secondary | ICD-10-CM | POA: Diagnosis not present

## 2015-04-28 DIAGNOSIS — G629 Polyneuropathy, unspecified: Secondary | ICD-10-CM

## 2015-04-28 DIAGNOSIS — E1165 Type 2 diabetes mellitus with hyperglycemia: Secondary | ICD-10-CM

## 2015-04-28 DIAGNOSIS — G8929 Other chronic pain: Secondary | ICD-10-CM

## 2015-04-28 MED ORDER — HYDROCODONE-ACETAMINOPHEN 10-325 MG PO TABS: 1.0000 | ORAL_TABLET | Freq: Three times a day (TID) | ORAL | Status: DC | PRN

## 2015-04-28 NOTE — Progress Notes (Signed)
Patient ID: [name], female   DOB: 06-05-1976, 39 y.o.   MRN: [id number]  HPI:  [name] presents for follow-up.  Chronic pain: Currently taking hydrocodone 3 times daily as needed for pain. Into taking it 3 times a day on most days. She is not taking nortriptyline. Gets #15 tramadol pills per month as well.  Feet burning: Has noted increasing pain in her feet and legs. This happens when she lays down at night. Does not notice it when she is standing. She does feel the need to move her legs constantly when she is lying down. She was put on a higher dose of gabapentin at her last visit for this, but states it has not helped.  Chronic abd neuropathic pain: Still having left-sided abdominal burning. It is in her skin, not deeper in her abdomen. She is urinating well, stooling normally, and eating and drinking well. Occasionally has the pain on the right side as well.  ROS: See HPI.  [place]: Type 2 diabetes followed by endocrinology, asthma, hypertension, sleep apnea, GERD, morbid obesity, hyperlipidemia, chronic opiate use  PHYSICAL EXAM: BP 126/85 mmHg  Pulse 102  Temp(Src) 98.2 F (36.8 C) (Oral)  Ht 5\' 2"  (1.575 m)  Wt 322 lb (146.058 kg)  BMI 58.88 kg/m2  LMP 04/28/2015 Gen: No acute distress, pleasant, cooperative HEENT: Normocephalic, atraumatic Lungs: Normal respiratory effort Abdomen: Soft, nontender to palpation, obese Neuro: Grossly nonfocal, speech normal Ext: No sores on either foot. 1+ DP pulse on left, DP pulse on right nonpalpable, possibly secondary to body habitus. Brisk capillary refill on both feet. Feet are normal in color. Able to move toes bilaterally. No appreciable lower extremity edema bilaterally.  ASSESSMENT/PLAN:  Encounter for chronic pain management Chronic pain is stable. She has lost some weight. This is ultimately the goal of giving her these narcotics, so that she can exercise in order to maintain a healthier weight. Will refill hydrocodone 3 times daily  for 3 months worth. She will follow-up in 3 months. It was too late to do a urine drug screen by the time our visit began today, as the lab closed early. We will do a urine drug screen at her next visit.   Restless leg syndrome Symptoms are consistent with possible restless leg syndrome. She has great reason for peripheral neuropathy of her lower extremities given her poorly controlled diabetes. I would like to do additional lab work including iron studies, B-12, folate and TSH prior to trying other medications for this. If these tests are unremarkable, will send in a trial of ropinirole for patient to try. I strongly doubt she has claudication due to peripheral vascular disease, despite having trouble palpating her pulses on her feet. I think this is most likely due to her body habitus rather than actual problem with circulation. Her symptoms are primarily at rest, not with exertion which speaks against claudication. She will follow-up with me for this in about one month.   Neuropathy, intercostal nerve Complete additional lab workup as noted below.    FOLLOW UP: F/u in 1 mo for neuropathy F/u in 3 mos for chronic pain/opioid management  [name], [place]

## 2015-04-28 NOTE — Patient Instructions (Signed)
Checking lots of labs this Friday I will call you if your test results are not normal.  Otherwise, I will send you a letter.  If you do not hear from me with in 2 weeks please call our office.     If labs are normal I will send in medication for you called ropinirole.  Refilled pain medication  Follow up with me in 1 month for your neuropathy.  Be well, Dr. [name]

## 2015-05-01 NOTE — Assessment & Plan Note (Addendum)
Symptoms are consistent with possible restless leg syndrome. She has great reason for peripheral neuropathy of her lower extremities given her poorly controlled diabetes. I would like to do additional lab work including iron studies, B-12, folate and TSH prior to trying other medications for this. If these tests are unremarkable, will send in a trial of ropinirole for patient to try. I strongly doubt she has claudication due to peripheral vascular disease, despite having trouble palpating her pulses on her feet. I think this is most likely due to her body habitus rather than actual problem with circulation. Her symptoms are primarily at rest, not with exertion which speaks against claudication. She will follow-up with me for this in about one month.

## 2015-05-01 NOTE — Assessment & Plan Note (Signed)
Chronic pain is stable. She has lost some weight. This is ultimately the goal of giving her these narcotics, so that she can exercise in order to maintain a healthier weight. Will refill hydrocodone 3 times daily for 3 months worth. She will follow-up in 3 months. It was too late to do a urine drug screen by the time our visit began today, as the lab closed early. We will do a urine drug screen at her next visit.

## 2015-05-01 NOTE — Assessment & Plan Note (Signed)
Complete additional lab workup as noted below.

## 2015-05-02 ENCOUNTER — Other Ambulatory Visit: Payer: Medicaid Other

## 2015-05-02 ENCOUNTER — Ambulatory Visit: Payer: Medicaid Other | Admitting: Endocrinology

## 2015-05-02 NOTE — Progress Notes (Signed)
I was the preceptor on the day of this visit.   Kaynen Minner MD  

## 2015-05-07 ENCOUNTER — Other Ambulatory Visit: Payer: Self-pay | Admitting: Family Medicine

## 2015-05-16 ENCOUNTER — Other Ambulatory Visit: Payer: Medicaid Other

## 2015-05-16 ENCOUNTER — Ambulatory Visit: Payer: Medicaid Other | Admitting: Endocrinology

## 2015-05-23 ENCOUNTER — Ambulatory Visit: Payer: Medicaid Other | Admitting: Family Medicine

## 2015-05-23 IMAGING — CR DG FOOT COMPLETE 3+V*R*
3 series · 3 of 3 positions shown · non-contrast
Comparison: DG FOOT COMPLETE*R* dated 01/01/2009; DG ANKLE
COMPLETE*R* dated 01/01/2009

CLINICAL DATA: Foot pain.

EXAM:
RIGHT FOOT COMPLETE - 3+ VIEW

[x foot ap right]
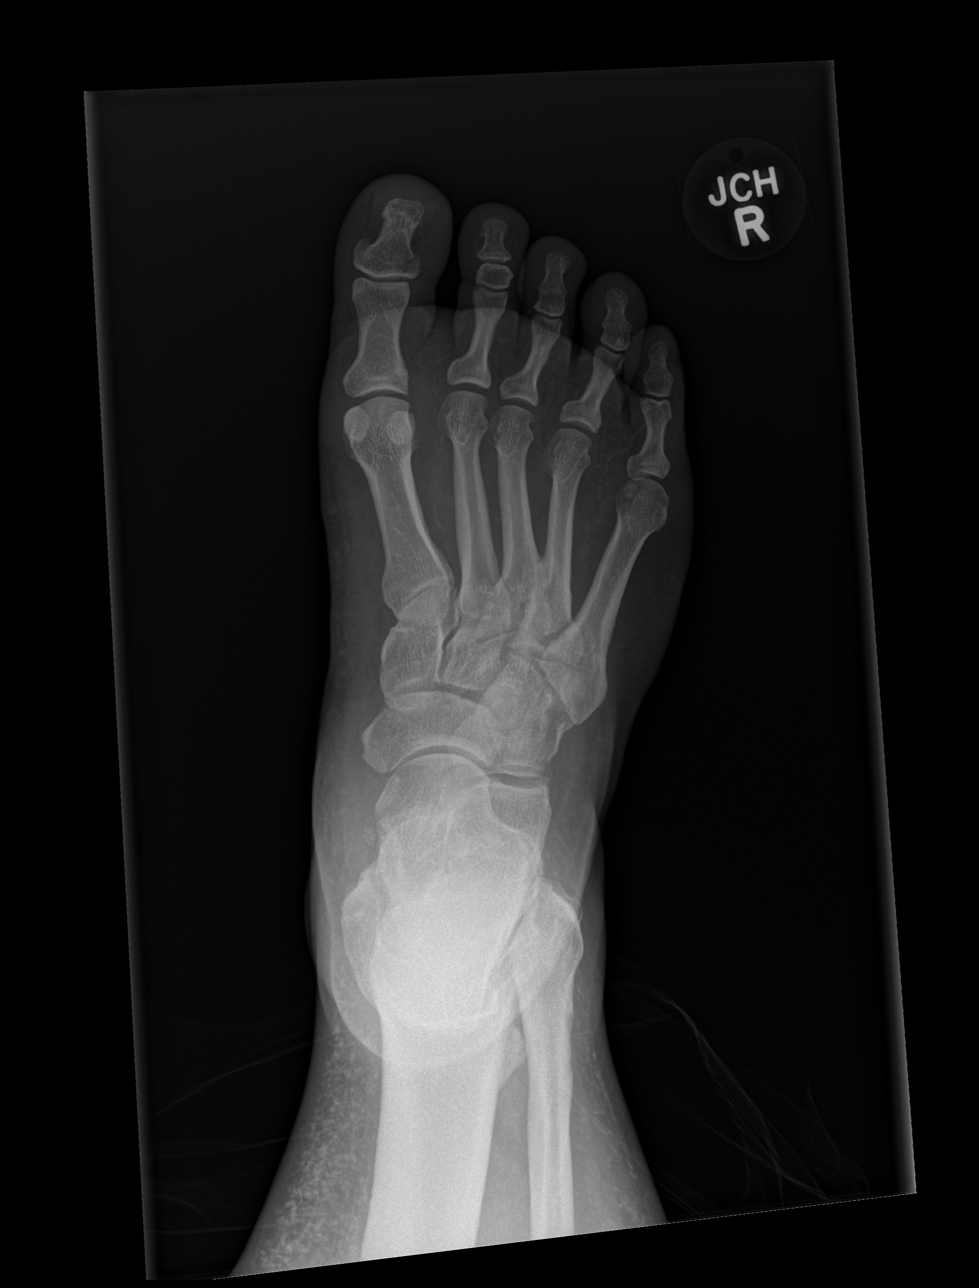

[x foot obl right]
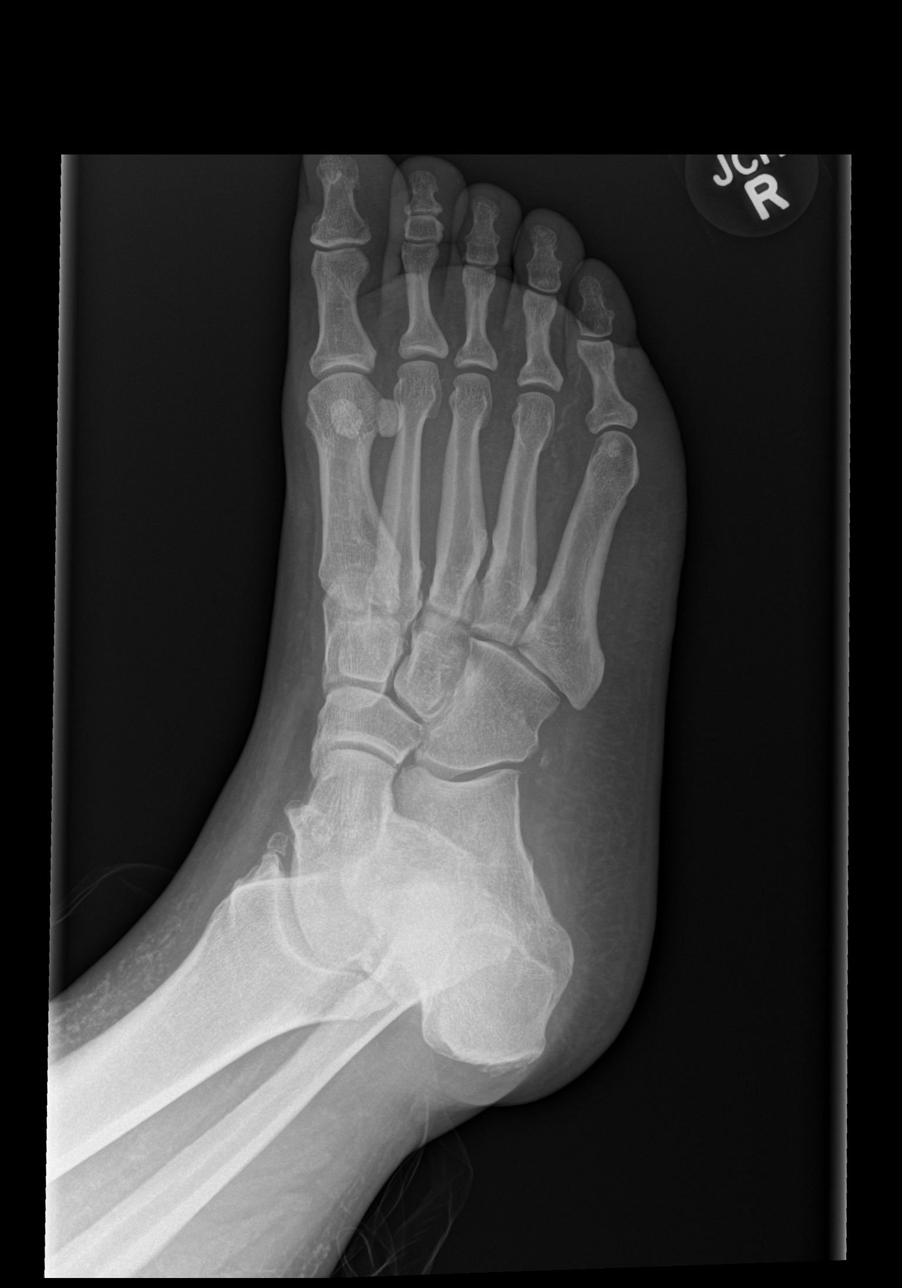

[x foot lat right]
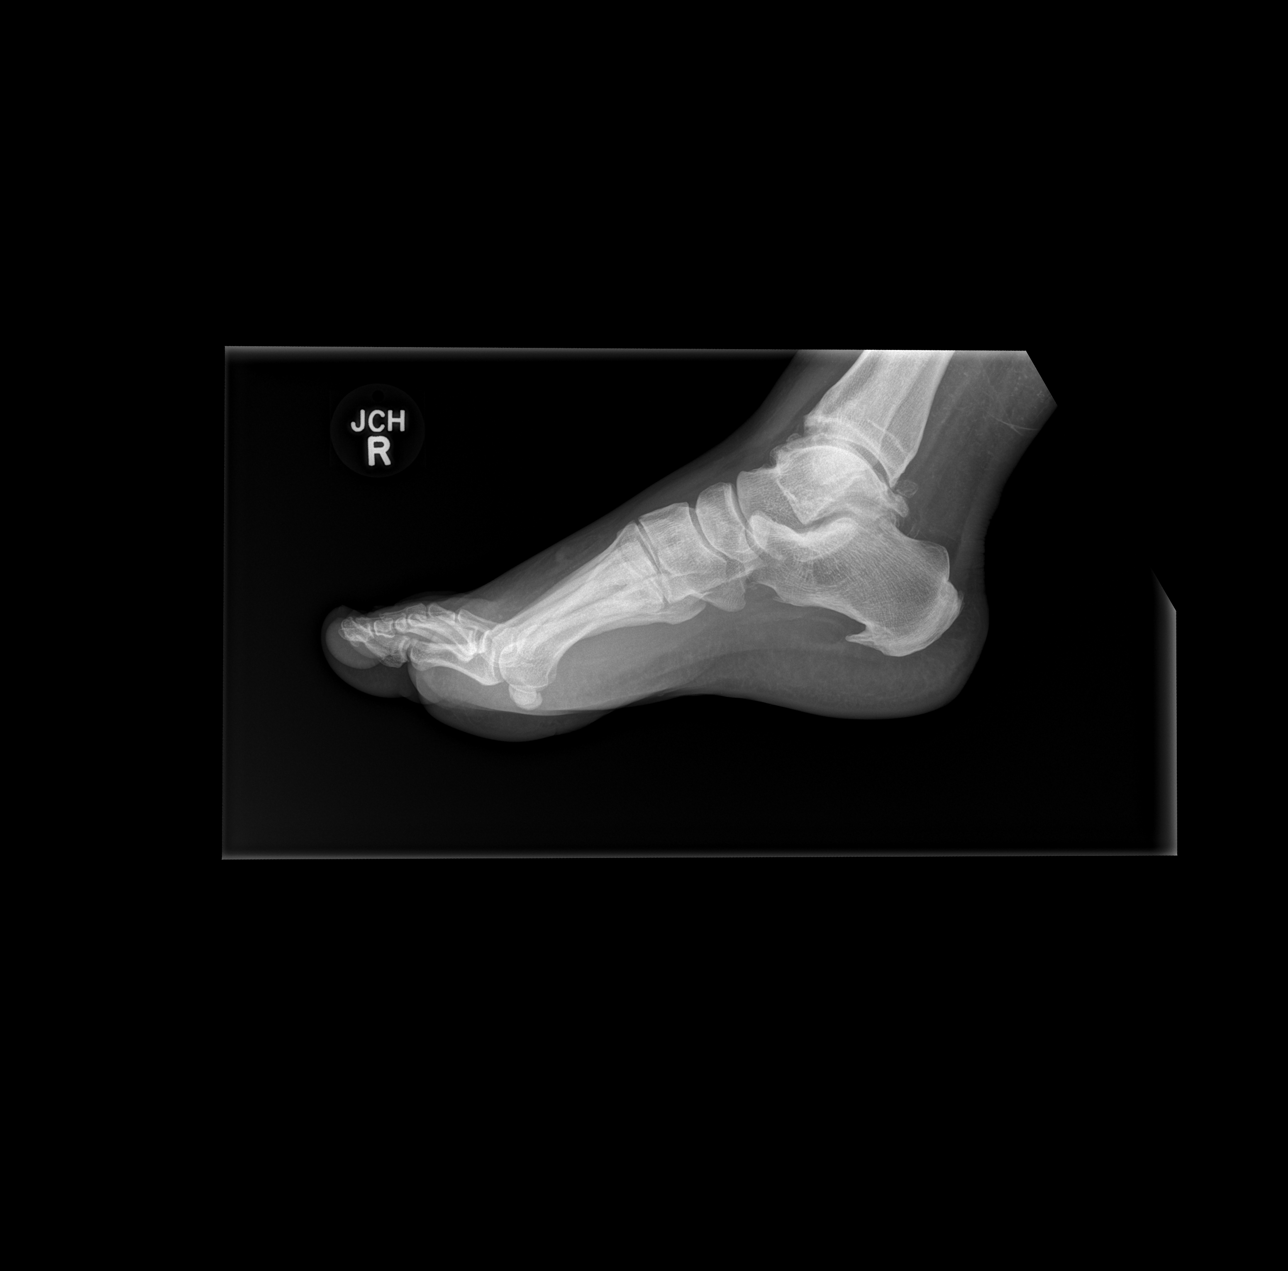

[3 of 3 positions shown; findings below may reference images not displayed]

FINDINGS: Diabetic type small vessel atherosclerotic calcification. There is
no fracture. The alignment of the bones of the right foot is within
normal limits. Calcification is present in the soft tissues of the
distal leg, although nonspecific this can be associated with chronic
venous insufficiency, dermatomyositis or scleroderma among other
causes. Old well corticated avulsion fracture fragment adjacent to
the medial malleolus. Calcaneal spurs are incidentally noted. Loose
body in the posterior ankle joint measuring 8 millimeters. There
appears to be other loose body in the anterior ankle joint,
measuring 6 millimeters.
IMPRESSION: No acute osseous abnormality. Old posttraumatic changes of the
ankle.

## 2015-05-23 IMAGING — CR DG CERVICAL SPINE COMPLETE 4+V
6 series · 6 of 6 positions shown · non-contrast
Comparison: 10/03/2005 cervical spine CT

CLINICAL DATA: Fall with pain.

EXAM:
CERVICAL SPINE  4+ VIEWS

[w cervical spine lat]
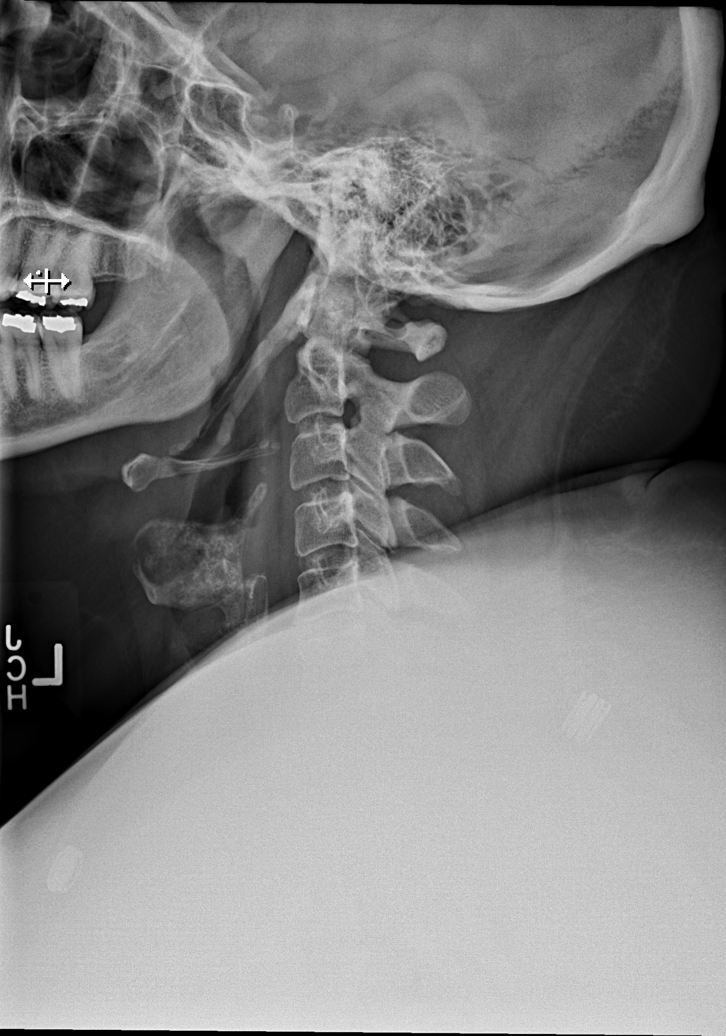

[w cervical spine ap_obl (1 of 2)]
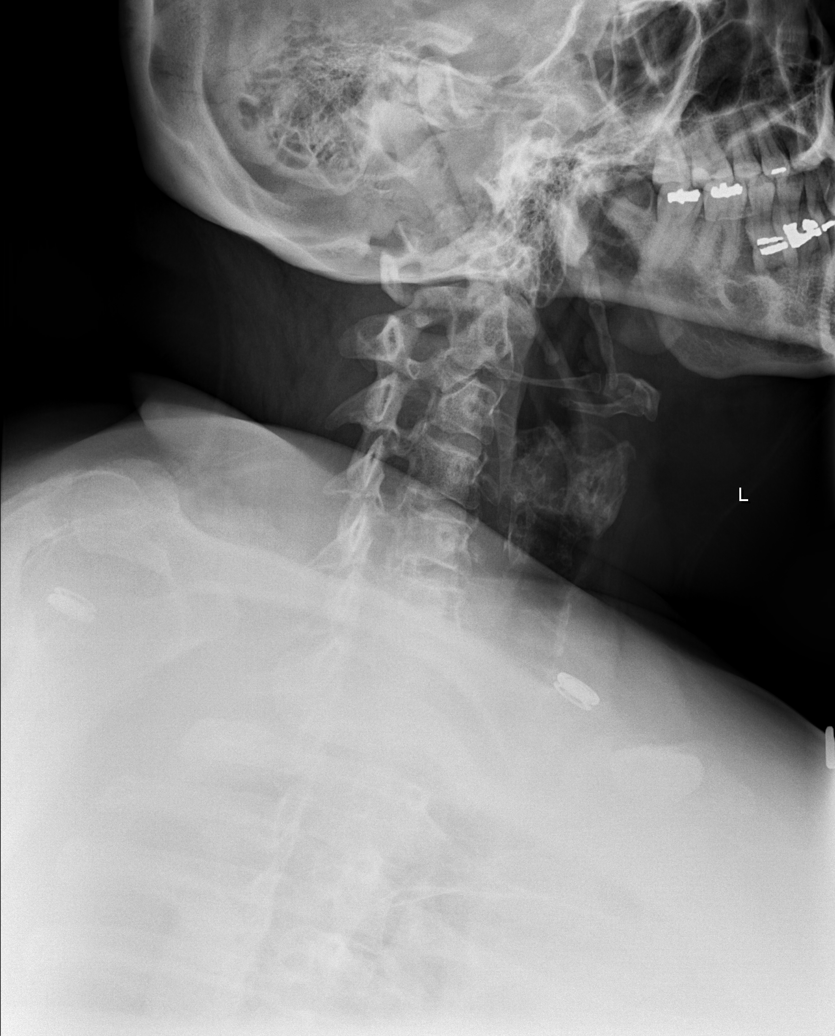

[w cervical spine ap_obl (2 of 2)]
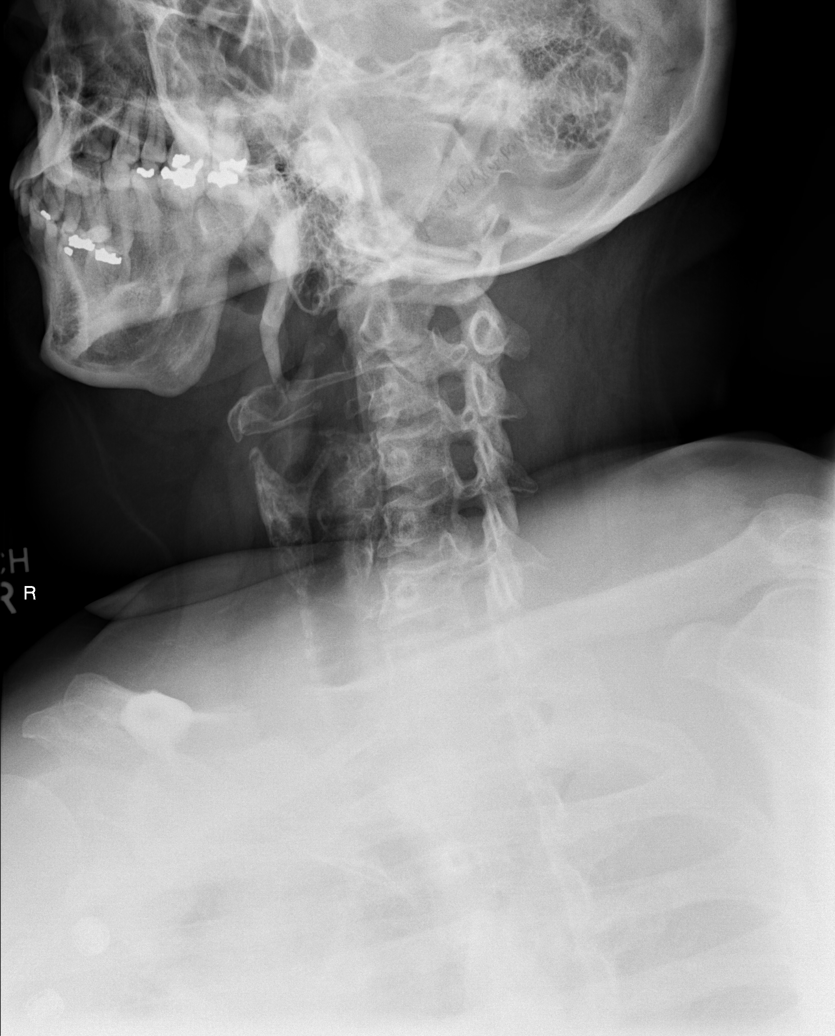

[w cervical spine ap]
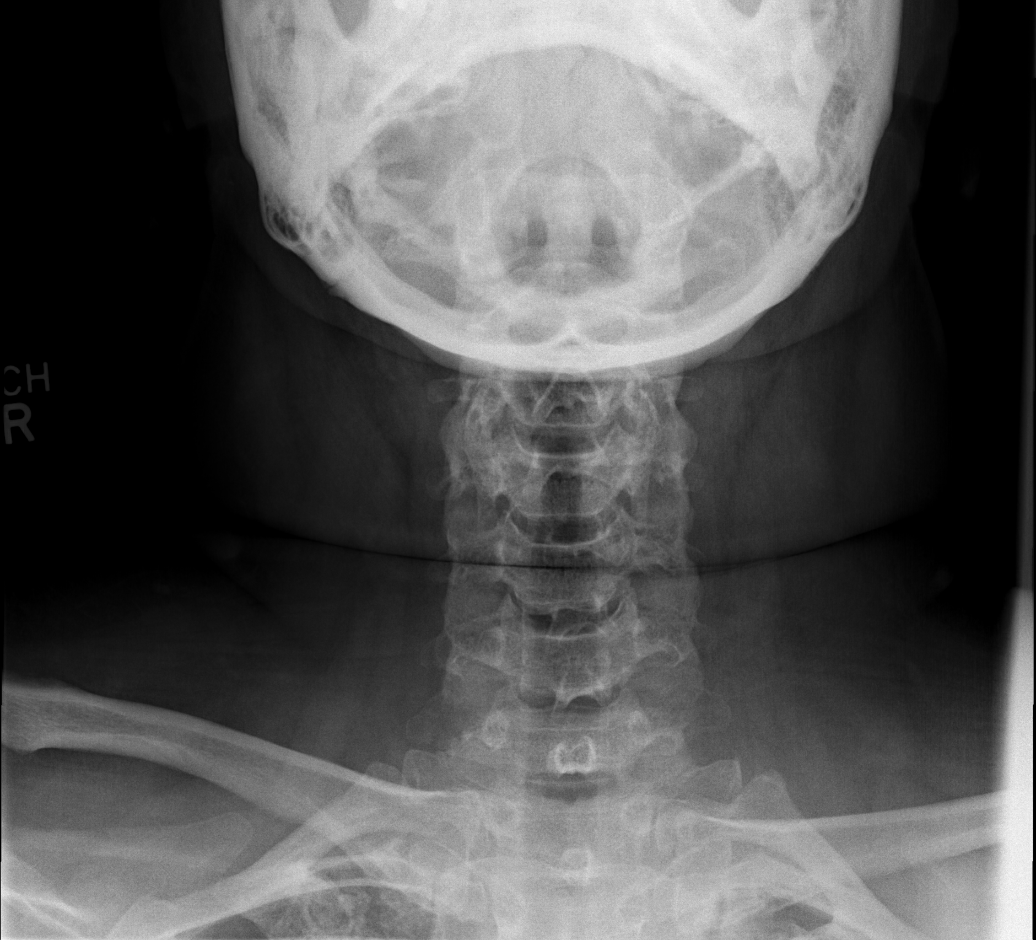

[w cervical spine odontoid]
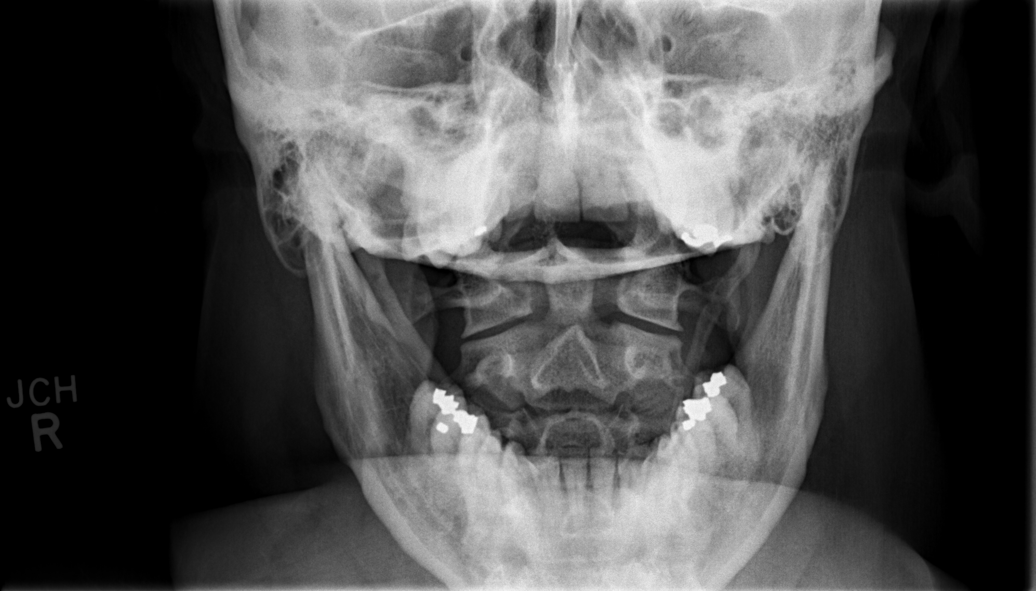

[w cervical swimmers]
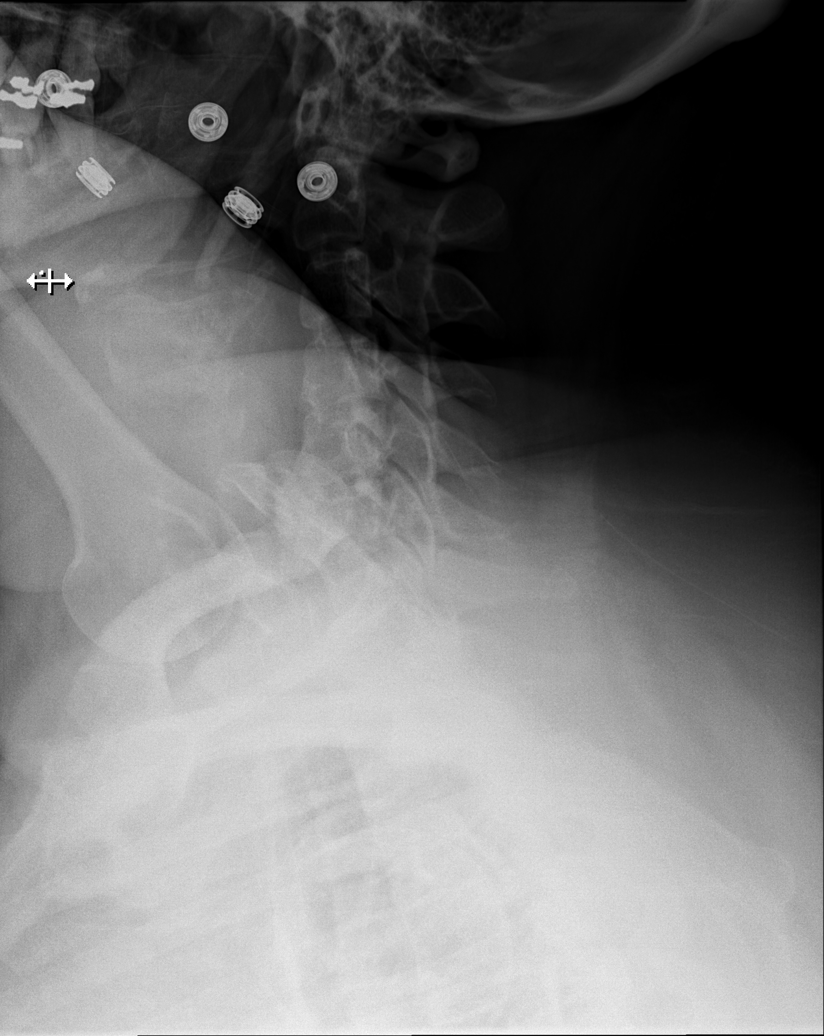

[6 of 6 positions shown; findings below may reference images not displayed]

FINDINGS: Despite swimmer's view positioning, the C7 and C6 vertebral bodies
are obscured laterally. Otherwise, there is no evidence of acute
fracture or subluxation. There is prevertebral thickening, but
stable from 2222. No significant degenerative changes.
IMPRESSION: Negative exam from the level of C1-C5. Nondiagnostic evaluation of
C6 and C7 due to patient shape.

## 2015-06-05 ENCOUNTER — Other Ambulatory Visit: Payer: Self-pay | Admitting: Family Medicine

## 2015-06-06 ENCOUNTER — Other Ambulatory Visit: Payer: Self-pay | Admitting: Family Medicine

## 2015-06-11 ENCOUNTER — Ambulatory Visit (INDEPENDENT_AMBULATORY_CARE_PROVIDER_SITE_OTHER): Payer: Medicaid Other | Admitting: Family Medicine

## 2015-06-11 ENCOUNTER — Encounter: Payer: Self-pay | Admitting: Family Medicine

## 2015-06-11 VITALS — BP 128/85 | HR 92 | Temp 98.1°F | Wt 311.6 lb

## 2015-06-11 DIAGNOSIS — R51 Headache: Secondary | ICD-10-CM | POA: Diagnosis not present

## 2015-06-11 DIAGNOSIS — I10 Essential (primary) hypertension: Secondary | ICD-10-CM | POA: Diagnosis not present

## 2015-06-11 DIAGNOSIS — L089 Local infection of the skin and subcutaneous tissue, unspecified: Secondary | ICD-10-CM | POA: Diagnosis not present

## 2015-06-11 DIAGNOSIS — I83899 Varicose veins of unspecified lower extremities with other complications: Secondary | ICD-10-CM | POA: Insufficient documentation

## 2015-06-11 DIAGNOSIS — G2581 Restless legs syndrome: Secondary | ICD-10-CM | POA: Diagnosis not present

## 2015-06-11 DIAGNOSIS — Z7189 Other specified counseling: Secondary | ICD-10-CM | POA: Diagnosis not present

## 2015-06-11 DIAGNOSIS — I8393 Asymptomatic varicose veins of bilateral lower extremities: Secondary | ICD-10-CM

## 2015-06-11 DIAGNOSIS — I8390 Asymptomatic varicose veins of unspecified lower extremity: Secondary | ICD-10-CM

## 2015-06-11 NOTE — Patient Instructions (Signed)
Phlebitis Phlebitis is soreness and puffiness (swelling) in a vein.  HOME CARE  Only take medicine as told by your doctor.  Raise (elevate) the affected limb on a pillow as told by your doctor.  Keep a warm pack on the affected vein as told by your doctor. Do not sleep with a heating pad.  Use special stockings or bandages around the area of the affected vein as told by your doctor. These will speed healing and keep the condition from coming back.  Talk to your doctor about all the medicines you take.  Get follow-up blood tests as told by your doctor.  If the phlebitis is in your legs:  Avoid standing or resting for long periods.  Keep your legs moving. Raise your legs when you sit or lie.  Do not smoke.  Follow-up with your doctor as told. GET HELP IF:  You have strange bruises or bleeding.  Your puffiness or pain in the affected area is not getting better.  You are taking medicine to lessen puffiness (anti-inflammatory medicine), and you get belly pain.  You have a fever. GET HELP RIGHT AWAY IF:   The phlebitis gets worse and you have more pain, puffiness (swelling), or redness.  You have trouble breathing or have chest pain. MAKE SURE YOU:   Understand these instructions.  Will watch your condition.  Will get help right away if you are not doing well or get worse. Document Released: 11/10/2009 Document Revised: 11/27/2013 Document Reviewed: 07/30/2013 [facility] Patient Information 2015 [place], [place]. This information is not intended to replace advice given to you by your health care provider. Make sure you discuss any questions you have with your health care provider.

## 2015-06-11 NOTE — Assessment & Plan Note (Signed)
Painful varicose vein vs. Superficial thrombophlebitis on right medial thigh, just above the knee - warm compresses, elevation, compression as tolerated - no NSAIDs given cardiac history - refer to vascular for possible ablation or other surgical intervention

## 2015-06-16 ENCOUNTER — Emergency Department (HOSPITAL_BASED_OUTPATIENT_CLINIC_OR_DEPARTMENT_OTHER)
Admit: 2015-06-16 | Discharge: 2015-06-16 | Disposition: A | Discharge status: Home / Self Care | Payer: Medicaid Other | Attending: Emergency Medicine | Admitting: Emergency Medicine

## 2015-06-16 ENCOUNTER — Emergency Department (HOSPITAL_COMMUNITY)
Admission: EM | Admit: 2015-06-16 | Discharge: 2015-06-16 | Disposition: A | Discharge status: Home / Self Care | Payer: Medicaid Other | Attending: Emergency Medicine | Admitting: Emergency Medicine

## 2015-06-16 ENCOUNTER — Encounter (HOSPITAL_COMMUNITY): Payer: Self-pay

## 2015-06-16 DIAGNOSIS — Z7951 Long term (current) use of inhaled steroids: Secondary | ICD-10-CM | POA: Insufficient documentation

## 2015-06-16 DIAGNOSIS — I10 Essential (primary) hypertension: Secondary | ICD-10-CM

## 2015-06-16 DIAGNOSIS — M79651 Pain in right thigh: Secondary | ICD-10-CM

## 2015-06-16 DIAGNOSIS — Z791 Long term (current) use of non-steroidal anti-inflammatories (NSAID): Secondary | ICD-10-CM | POA: Insufficient documentation

## 2015-06-16 DIAGNOSIS — G2581 Restless legs syndrome: Secondary | ICD-10-CM

## 2015-06-16 DIAGNOSIS — Z79891 Long term (current) use of opiate analgesic: Secondary | ICD-10-CM | POA: Diagnosis not present

## 2015-06-16 DIAGNOSIS — J449 Chronic obstructive pulmonary disease, unspecified: Secondary | ICD-10-CM | POA: Insufficient documentation

## 2015-06-16 DIAGNOSIS — Z862 Personal history of diseases of the blood and blood-forming organs and certain disorders involving the immune mechanism: Secondary | ICD-10-CM | POA: Insufficient documentation

## 2015-06-16 DIAGNOSIS — M1990 Unspecified osteoarthritis, unspecified site: Secondary | ICD-10-CM | POA: Insufficient documentation

## 2015-06-16 DIAGNOSIS — K219 Gastro-esophageal reflux disease without esophagitis: Secondary | ICD-10-CM | POA: Diagnosis not present

## 2015-06-16 DIAGNOSIS — Z794 Long term (current) use of insulin: Secondary | ICD-10-CM | POA: Diagnosis not present

## 2015-06-16 DIAGNOSIS — E119 Type 2 diabetes mellitus without complications: Secondary | ICD-10-CM | POA: Insufficient documentation

## 2015-06-16 DIAGNOSIS — F319 Bipolar disorder, unspecified: Secondary | ICD-10-CM | POA: Diagnosis not present

## 2015-06-16 DIAGNOSIS — E785 Hyperlipidemia, unspecified: Secondary | ICD-10-CM | POA: Diagnosis not present

## 2015-06-16 DIAGNOSIS — Z79899 Other long term (current) drug therapy: Secondary | ICD-10-CM | POA: Insufficient documentation

## 2015-06-16 DIAGNOSIS — Z7189 Other specified counseling: Secondary | ICD-10-CM | POA: Diagnosis not present

## 2015-06-16 DIAGNOSIS — R51 Headache: Secondary | ICD-10-CM | POA: Diagnosis not present

## 2015-06-16 DIAGNOSIS — I8391 Asymptomatic varicose veins of right lower extremity: Secondary | ICD-10-CM | POA: Insufficient documentation

## 2015-06-16 DIAGNOSIS — I8390 Asymptomatic varicose veins of unspecified lower extremity: Secondary | ICD-10-CM

## 2015-06-16 DIAGNOSIS — L089 Local infection of the skin and subcutaneous tissue, unspecified: Secondary | ICD-10-CM

## 2015-06-16 MED ORDER — MORPHINE SULFATE 4 MG/ML IJ SOLN
4.0000 mg | Freq: Once | INTRAMUSCULAR | Status: AC
  Administered 2015-06-16: 4 mg via INTRAMUSCULAR
  Filled 2015-06-16: qty 1

## 2015-06-16 NOTE — Discharge Instructions (Signed)
Varicose Veins Wear compression hose or stockings. Elevate the leg.  Take hydrocodone as prescribed by your physician.  Follow up with vascular surgery.  Varicose veins are veins that have become enlarged and twisted. CAUSES This condition is the result of valves in the veins not working properly. Valves in the veins help return blood from the leg to the heart. If these valves are damaged, blood flows backwards and backs up into the veins in the leg near the skin. This causes the veins to become larger. People who are on their feet a lot, who are pregnant, or who are overweight are more likely to develop varicose veins. SYMPTOMS   Bulging, twisted-appearing, bluish veins, most commonly found on the legs.  Leg pain or a feeling of heaviness. These symptoms may be worse at the end of the day.  Leg swelling.  Skin color changes. DIAGNOSIS  Varicose veins can usually be diagnosed with an exam of your legs by your caregiver. He or she may recommend an ultrasound of your leg veins. TREATMENT  Most varicose veins can be treated at home.However, other treatments are available for people who have persistent symptoms or who want to treat the cosmetic appearance of the varicose veins. These include:  Laser treatment of very small varicose veins.  Medicine that is shot (injected) into the vein. This medicine hardens the walls of the vein and closes off the vein. This treatment is called sclerotherapy. Afterwards, you may need to wear clothing or bandages that apply pressure.  Surgery. HOME CARE INSTRUCTIONS   Do not stand or sit in one position for long periods of time. Do not sit with your legs crossed. Rest with your legs raised during the day.  Wear elastic stockings or support hose. Do not wear other tight, encircling garments around the legs, pelvis, or waist.  Walk as much as possible to increase blood flow.  Raise the foot of your bed at night with 2-inch blocks.  If you get a cut in  the skin over the vein and the vein bleeds, lie down with your leg raised and press on it with a clean cloth until the bleeding stops. Then place a bandage (dressing) on the cut. See your caregiver if it continues to bleed or needs stitches. SEEK MEDICAL CARE IF:   The skin around your ankle starts to break down.  You have pain, redness, tenderness, or hard swelling developing in your leg over a vein.  You are uncomfortable due to leg pain. Document Released: 09/01/2005 Document Revised: 02/14/2012 Document Reviewed: 01/18/2011 [facility] Patient Information 2015 [place], [place]. This information is not intended to replace advice given to you by your health care provider. Make sure you discuss any questions you have with your health care provider.

## 2015-06-16 NOTE — Progress Notes (Signed)
*  Preliminary Results* Right lower extremity venous duplex completed. Study was technically limited and difficult due to patient body habits, patient position, and poor patient cooperation. Visualized veins of the right lower extremity are negative for deep vein thrombosis. There is a varicose vein versus superficial vein of the proximal popliteal fossa that is patent without evidence of thrombosis; visualization of origin was limited due to technical limitations.  There is no evidence of right Baker's cyst.  06/16/2015 8:18 AM  Maudry Mayhew, RVT, RDCS, RDMS

## 2015-06-16 NOTE — ED Provider Notes (Signed)
CSN: [id number]     Arrival date & time 06/16/15  0610 History   First MD Initiated Contact with Patient 06/16/15 0615     Chief Complaint  Patient presents with  . Varicose Veins     (Consider location/radiation/quality/duration/timing/severity/associated sxs/prior Treatment) The history is provided by the patient. No language interpreter was used.  [name] is a 39 y.o female with a history of asthma, hypertension, morbid obesity, obstructive sleep apnea, diabetes, hyperlipidemia, COPD who presents for right medial and posterior thigh pain since 3:30 AM today. She states she took a hydrocodone at 12 AM today with minimal relief. She is also tried icing the area and elevating the leg. She denies using compression stockings. Walking does not make the pain worse. She states she has the pain even while lying still. She denies any history of PE or DVT. She denies any numbness or weakness to the right lower extremity.  Past Medical History  Diagnosis Date  . Morbid obesity   . Alveolar hypoventilation   . Asthma   . GERD (gastroesophageal reflux disease)   . Rectal fissure   . Hypertension   . Costochondritis   . Cellulitis 08/2010-08/2011  . Depression   . Bipolar 2 disorder   . Obstructive sleep apnea   . Chest pain     NO CAD by cath 07/15/16, nl LV systolic fxn  . Diabetes mellitus 2000    Type 2, Uncontrolled  . Arthritis   . HLD (hyperlipidemia)   . Anemia   . COPD (chronic obstructive pulmonary disease)    Past Surgical History  Procedure Laterality Date  . Carpal tunnel release    . Bil knee surgery    . Mass excision N/A 06/29/2013    Procedure:  WIDE LOCAL EXCISION OF POSTERIOR NECK ABSCESS;  Surgeon: Ralene Ok, MD;  Location: [facility];  Service: General;  Laterality: N/A;  . Cardiac catheterization    . Left heart catheterization with coronary angiogram N/A 07/27/2012    Procedure: LEFT HEART CATHETERIZATION WITH CORONARY ANGIOGRAM;  Surgeon: Sherren Mocha, MD;   Location: [facility] CATH LAB;  Service: Cardiovascular;  Laterality: N/A;   Family History  Problem Relation Age of Onset  . Diabetes Mother   . Hyperlipidemia Mother   . Depression Mother   . Heart attack Paternal Uncle   . Heart disease Paternal Grandmother   . Heart attack Paternal Grandmother   . Heart attack Paternal Grandfather   . Heart disease Paternal Grandfather    History  Substance Use Topics  . Smoking status: Former Smoker -- 0.30 packs/day for .3 years    Types: Cigarettes    Quit date: 12/06/1993  . Smokeless tobacco: Never Used  . Alcohol Use: No   OB History    No data available     Review of Systems  Constitutional: Negative for fever.  Respiratory: Negative for shortness of breath.   Cardiovascular: Negative for chest pain.  Musculoskeletal: Negative for gait problem.  Skin: Positive for color change.  Neurological: Negative for weakness and numbness.  All other systems reviewed and are negative.     Allergies  Kiwi extract and Nubain  Home Medications   Prior to Admission medications   Medication Sig Start Date End Date Taking? Authorizing Provider  ACCU-CHEK FASTCLIX LANCETS MISC Use to check blood sugar 2 times per day. 10/07/14   [name], MD  ACCU-CHEK SMARTVIEW test strip USE TO CHECK BLOOD SUGAR 4 TIMES A DAY 06/10/15   Kayleen Memos  Mingo Amber, MD  atorvastatin (LIPITOR) 20 MG tablet Take 20 mg by mouth daily.    Historical Provider, MD  carvedilol (COREG) 12.5 MG tablet TAKE 1 TABLET BY MOUTH TWICE DAILY WITH A MEAL 02/05/15   [name], MD  cetirizine (ZYRTEC) 10 MG tablet TAKE 1 TABLET BY MOUTH DAILY 05/08/15   [name], MD  cetirizine (ZYRTEC) 10 MG tablet TAKE 1 TABLET BY MOUTH DAILY 06/10/15   Alveda Reasons, MD  clotrimazole (LOTRIMIN) 1 % cream APPLY TOPICALLY 2 TIMES A DAY AS NEEDED FOR SKIN YEAST 06/10/15   Alveda Reasons, MD  cyclobenzaprine (FLEXERIL) 10 MG tablet TAKE 1 TABLET BY MOUTH TWICE DAILY AS NEEDED FOR MUSCLE  SPASMS 03/06/15   [name], MD  EASY TOUCH PEN NEEDLES 31G X 8 MM MISC USE TO INJECT INSULIN, 7 TIMES A DAY, OR AS DIRECTED. 06/10/15   Alveda Reasons, MD  fluticasone (FLONASE) 50 MCG/ACT nasal spray Place 2 sprays into both nostrils daily. 05/21/14   Melony Overly, MD  gabapentin (NEURONTIN) 300 MG capsule TAKE 3 CAPSULES BY MOUTH EVERY MORNING AND AFTERNOON AND TAKE 4 CAPSULES EVERY EVENING 05/08/15   [name], MD  gabapentin (NEURONTIN) 300 MG capsule TAKE 3 CAPSULES BY MOUTH EVERY MORNING AND AFTERNOON AND TAKE 4 CAPSULES BY MOUTH EVERY EVENING. 06/10/15   Alveda Reasons, MD  HYDROcodone-acetaminophen [facility]) 10-325 MG per tablet Take 1 tablet by mouth every 8 (eight) hours as needed. 04/28/15   [name], MD  hydrocortisone cream 0.5 % Apply 1 application topically 2 (two) times daily as needed for itching. To feet 02/26/15   [name], MD  ibuprofen (ADVIL,MOTRIN) 800 MG tablet Take 1 tablet (800 mg total) by mouth 3 (three) times daily with meals. 12/29/14   [name], PA-C  Insulin Glargine (LANTUS SOLOSTAR) 100 UNIT/ML Solostar Pen Inject 500 Units into the skin every morning. Inject 150 units twice daily. Patient taking differently: Inject 500 Units into the skin every morning.  01/27/15   [name], MD  lisinopril (PRINIVIL,ZESTRIL) 10 MG tablet Take 1 tablet (10 mg total) by mouth daily. 03/12/14   [name], MD  nitroGLYCERIN (NITROSTAT) 0.4 MG SL tablet DISSOLVE 1 TABLET UNDER THE TONGUE EVERY 5 MINUTES AS NEEDED FOR CHEST PAIN. 04/03/15   [name], MD  pantoprazole (PROTONIX) 40 MG tablet TAKE 1 TABLET BY MOUTH EVERY DAY 05/08/15   [name], MD  pantoprazole (PROTONIX) 40 MG tablet TAKE 1 TABLET BY MOUTH EVERY DAY 06/10/15   Alveda Reasons, MD  PROCTOSOL HC 2.5 % rectal cream APPLY TO RECTAL AREA TWICE DAILY 06/10/15   Alveda Reasons, MD  PROVENTIL HFA 108 (90 BASE) MCG/ACT inhaler INHALE 1 PUFF BY MOUTH EVERY 6 HOURS  AS NEEDED FOR WHEEZING OR SHORTNESS OF BREATH. 06/10/15   Alveda Reasons, MD  SYMBICORT 160-4.5 MCG/ACT inhaler INHALE 2 PUFFS INTO LUNGS TWICE A DAY 03/06/15   [name], MD  topiramate (TOPAMAX) 25 MG tablet TAKE 1 TABLET BY MOUTH EVERY NIGHT AT BEDTIME 06/10/15   Alveda Reasons, MD  traMADol (ULTRAM) 50 MG tablet TAKE 1 TABLET BY MOUTH EVERY DAY AS NEEDED FOR MODERATE PAIN **MUST LAST 30 DAYS** 04/03/15   [name], MD  VOLTAREN 1 % GEL APPLY 2 GRAMS TOPICALLY TO AFFECTED AREA TWICE DAILY 06/10/15   Alveda Reasons, MD   BP 128/73 mmHg  Pulse 83  Temp(Src) 97.8  F (36.6 C) (Oral)  Resp 18  Ht 5\' 2"  (1.575 m)  Wt 311 lb (141.069 kg)  BMI 56.87 kg/m2  SpO2 97%  LMP 05/15/2015 Physical Exam  Constitutional: She is oriented to person, place, and time. Vital signs are normal. She appears well-developed and well-nourished.  Morbidly obese.  HENT:  Head: Normocephalic and atraumatic.  Eyes: Conjunctivae are normal.  Neck: Normal range of motion. Neck supple.  Cardiovascular: Normal rate, regular rhythm and normal heart sounds.   Pulmonary/Chest: Effort normal and breath sounds normal. No respiratory distress. She has no wheezes. She has no rales.  Abdominal: Soft. There is no tenderness.  Musculoskeletal: Normal range of motion.       Legs: <2 second cap refill. DP pulses bilaterally in tact but weak. Varicosities of the right thigh and left lower leg. Full ROM of right leg. Tenderness to palpation over the right medial and posterior thigh as diagramed. No edema or erythema.  No calf tenderness.  Neurological: She is alert and oriented to person, place, and time.  Skin: Skin is warm and dry.  Psychiatric: She has a normal mood and affect. Her behavior is normal.  Nursing note and vitals reviewed.   ED Course  Procedures (including critical care time) Labs Review Labs Reviewed - No data to display  Imaging Review: Venous Doppler of right lower extremity: Study  was technically limited and difficult due to patient body habits, patient position, and poor patient cooperation. Visualized veins of the right lower extremity are negative for deep vein thrombosis. There is a varicose vein versus superficial vein of the proximal popliteal fossa that is patent without evidence of thrombosis; visualization of origin was limited due to technical limitations. There is no evidence of right Baker's cyst.   EKG Interpretation None      MDM   Final diagnoses:  Right thigh pain  Varicose vein of leg   Vitals stable. She is no acute distress. Patients DP pulses are most likely weak due to body habitus.  According to her pcp, Dr. Dossie Arbour, she has chronic leg pain: Currently taking hydrocodone 3 times daily as needed for pain. She takes it 3 times a day on most days. She is not taking nortriptyline. Gets #15 tramadol pills per month as well. Her gabapentin was increased in May due to the pain.  He did not think it was claudication because it is not worse with walking.  Her distal pulses are hard to palpate but he thought it was secondary to her body habitus.  Medications  morphine 4 MG/ML injection 4 mg (4 mg Intramuscular Given 06/16/15 0748)  Doppler is negative for DVT. I did not give the patient additional pain medications to go home with since she is on pain meds prescribed by her pcp.  She can follow up with her pcp and I have also given her a vascular surgeon referral for varicosities. I discussed using compression stockings as well as elevation of the leg.  Patient verbally agrees with the plan.      Ottie Glazier, PA-C 06/16/15 8127  Everlene Balls, MD 06/16/15 [phone]

## 2015-06-16 NOTE — ED Notes (Signed)
Pt states she was dx with varicose vein in right leg last week; pt states pain has been constant but get worse at times; pt states she was told by PCP to come to Ed of pain get worse; Pt c/o pain at 9/10 stabbing feeling; Pt ambulatory on arrival; Pt a&o x 4; Pt states he has taken Vicodin at 12 am and iced the leg with not relief.

## 2015-06-16 NOTE — Progress Notes (Signed)
   Subjective:   Dakotah Heiman is a 39 y.o. female with a history of poorly controlled diabetes and obesity here for painful blue spot on leg.  Fraser Din reports that about 2 weeks ago she notes a blue area on her right inner thigh, just above the knee. Initially the pain stayed in the blue area but a few days ago it started radiating up to the groin. It is tender to the touch and seems similar to varicose veins she has had in the past but they have never been painful before. No swelling of her legs, SOB, fevers, spreading warmth or redness at the site.   Review of Systems:  Per HPI. All other systems reviewed and are negative.   PMH, PSH, Medications, Allergies, and FmHx reviewed and updated in EMR.  Social History: former smoker  Objective:  BP 128/85 mmHg  Pulse 92  Temp(Src) 98.1 F (36.7 C)  Wt 311 lb 9.6 oz (141.341 kg)  Gen:  39 y.o. female in NAD HEENT: NCAT, MMM, anicteric sclerae CV: RRR Resp: Non-labored Abd: Soft, NTND, BS present, no guarding or organomegaly Ext: WWP, no edema, small varicose vein on right inner thigh just above the knee, tender to light palpation, no warmth or redness, no streaking MSK: Full ROM, strength intact Neuro: Alert and oriented, speech normal      Chemistry      Component Value Date/Time   NA 131* 01/22/2015 2348   K 3.8 01/22/2015 2348   CL 99 01/22/2015 2348   CO2 20 01/22/2015 2348   BUN 12 01/22/2015 2348   CREATININE 0.67 01/22/2015 2348   CREATININE 0.44* 03/15/2012 1034      Component Value Date/Time   CALCIUM 9.0 01/22/2015 2348   ALKPHOS 115 01/22/2015 2348   AST 18 01/22/2015 2348   ALT 18 01/22/2015 2348   BILITOT 0.5 01/22/2015 2348      Lab Results  Component Value Date   WBC 10.5 01/22/2015   HGB 13.9 01/22/2015   HCT 40.9 01/22/2015   MCV 88.0 01/22/2015   PLT 299 01/22/2015   Lab Results  Component Value Date   TSH 1.62 11/15/2014   Lab Results  Component Value Date   HGBA1C 12.6* 01/24/2015    Assessment:     Shaleka Brines is a 39 y.o. female here for painful varicose vein    Plan:     See problem list for problem-specific plans.   Frazier Richards, MD PGY-3,  [facility] 06/16/2015  9:50 AM

## 2015-06-17 ENCOUNTER — Other Ambulatory Visit: Payer: Self-pay | Admitting: *Deleted

## 2015-06-17 DIAGNOSIS — I8390 Asymptomatic varicose veins of unspecified lower extremity: Secondary | ICD-10-CM

## 2015-06-22 ENCOUNTER — Telehealth: Payer: Self-pay | Admitting: Family Medicine

## 2015-06-22 NOTE — Telephone Encounter (Signed)
Pt complains of worsening pain in varicose vein on right legs, more protruding than previously. Is doing ice, compression, vicodin and ibuprofen but not helping. Has vascular appt in August and is on their cancellation list. Recommended continuing current measures and calling for SDA in am if pain still uncontrolled.

## 2015-06-24 ENCOUNTER — Ambulatory Visit (INDEPENDENT_AMBULATORY_CARE_PROVIDER_SITE_OTHER): Payer: Medicaid Other | Admitting: Family Medicine

## 2015-06-24 ENCOUNTER — Encounter: Payer: Self-pay | Admitting: Family Medicine

## 2015-06-24 VITALS — BP 141/76 | HR 107 | Temp 98.7°F | Wt 312.0 lb

## 2015-06-24 DIAGNOSIS — L089 Local infection of the skin and subcutaneous tissue, unspecified: Secondary | ICD-10-CM | POA: Diagnosis not present

## 2015-06-24 DIAGNOSIS — Z7189 Other specified counseling: Secondary | ICD-10-CM | POA: Diagnosis present

## 2015-06-24 DIAGNOSIS — I8390 Asymptomatic varicose veins of unspecified lower extremity: Secondary | ICD-10-CM

## 2015-06-24 DIAGNOSIS — I10 Essential (primary) hypertension: Secondary | ICD-10-CM | POA: Diagnosis not present

## 2015-06-24 DIAGNOSIS — G2581 Restless legs syndrome: Secondary | ICD-10-CM | POA: Diagnosis not present

## 2015-06-24 DIAGNOSIS — R51 Headache: Secondary | ICD-10-CM | POA: Diagnosis not present

## 2015-06-24 DIAGNOSIS — I8393 Asymptomatic varicose veins of bilateral lower extremities: Secondary | ICD-10-CM

## 2015-06-24 MED ORDER — TRAMADOL HCL 50 MG PO TABS: ORAL_TABLET | ORAL | Status: DC

## 2015-06-24 NOTE — Assessment & Plan Note (Signed)
Persistent pain despite conservative measures. Previously referred to vascular surgery but not able to get appt there until later in August. No signs of DVT on exam (and negative doppler on 7/11), also no signs of infection or systemic illness. Discussed with pt that other than conservative measures, the best we can try to do at this time is pain control and try to get her in sooner with vascular surgery. Plan: - pt to try and call to move up vascular surgery appt if possible - rx for increased quantity of tramadol (#60 with no refills) to be used in addition to her chronic norco. Discussed risk of respiratory depression and excess sedation with multiple pain medications and counseled pt to use caution with these medications - continue conservative measures - f/u with me after her vascular appt

## 2015-06-24 NOTE — Progress Notes (Signed)
Patient ID: [name], female   DOB: 05/10/1976, 39 y.o.   MRN: [id number]  HPI:  Pt presents for a same day appointment to discuss R leg pain.  Seen on 7/6 here at [facility] by Dr. Sherril Cong, diagnosed with painful varicose vein of R leg. Then seen in ED on 7/11 for the same. Had doppler u/s to eval for DVT which was negative but did note varicose vein vs superficial vein in popliteal fossa that was patent without thrombosis (although note study technically limited due to positioning and body habitus).  Pt reports continued pain in R inner thigh just above knee. Has tried multiple conservative measures including ted hose, ACE wrap, ice, elevation, and her usual chronic norco, which she takes three times a day. Has also taken tramadol (typically gets 15 pills per month, for use daily as needed). Denies fevers or shortness of breath. Was referred to vascular surgery by Dr. Sherril Cong but was not able to get appointment there until later in August.  ROS: See HPI  [place]: OSA compliant with CPAP, asthma, depression, T2DM poorly controlled, chronic opioid use, HTN, GERD, HLD, morbid obesity BMI 57  PHYSICAL EXAM: BP 141/76 mmHg  Pulse 107  Temp(Src) 98.7 F (37.1 C) (Oral)  Wt 312 lb (141.522 kg)  LMP 05/15/2015 Gen: NAD Lungs: NWOB Abdomen: obese Ext: RLE with prominent vein on medial aspect of R thigh just above knee which is moderately tender to palpation. No erythema, skin breakdown, streaking, or warmth. Medial thigh also TTP. No palpable cords. RLE and LLE symmetric in size. Negative homan's on R.  ASSESSMENT/PLAN:  Varicose vein of leg Persistent pain despite conservative measures. Previously referred to vascular surgery but not able to get appt there until later in August. No signs of DVT on exam (and negative doppler on 7/11), also no signs of infection or systemic illness. Discussed with pt that other than conservative measures, the best we can try to do at this time is pain control and try to get  her in sooner with vascular surgery. Plan: - pt to try and call to move up vascular surgery appt if possible - rx for increased quantity of tramadol (#60 with no refills) to be used in addition to her chronic norco. Discussed risk of respiratory depression and excess sedation with multiple pain medications and counseled pt to use caution with these medications - continue conservative measures - f/u with me after her vascular appt    FOLLOW UP: Attempt to move vascular surgery appt up F/u after that appt with me  [name], [place]

## 2015-06-24 NOTE — Patient Instructions (Signed)
Try to get in with the vascular clinic Continue the compression, ice, elevation, ted hose, etc Giving extra supply of tramadol for when pain is very bad - can take one pill every 6 hours as needed Do NOT drive while on this medicine and use caution as it may make you sleepy Follow up with me after your vascular clinic appointment  Be well, Dr. [name]

## 2015-06-29 ENCOUNTER — Encounter (HOSPITAL_COMMUNITY): Payer: Self-pay | Admitting: Emergency Medicine

## 2015-06-29 ENCOUNTER — Emergency Department (HOSPITAL_COMMUNITY)
Admission: EM | Admit: 2015-06-29 | Discharge: 2015-06-29 | Disposition: A | Discharge status: Home / Self Care | Payer: Medicaid Other | Attending: Emergency Medicine | Admitting: Emergency Medicine

## 2015-06-29 DIAGNOSIS — Z23 Encounter for immunization: Secondary | ICD-10-CM | POA: Insufficient documentation

## 2015-06-29 DIAGNOSIS — Z8669 Personal history of other diseases of the nervous system and sense organs: Secondary | ICD-10-CM | POA: Diagnosis not present

## 2015-06-29 DIAGNOSIS — Z794 Long term (current) use of insulin: Secondary | ICD-10-CM | POA: Diagnosis not present

## 2015-06-29 DIAGNOSIS — W260XXA Contact with knife, initial encounter: Secondary | ICD-10-CM | POA: Insufficient documentation

## 2015-06-29 DIAGNOSIS — K219 Gastro-esophageal reflux disease without esophagitis: Secondary | ICD-10-CM | POA: Insufficient documentation

## 2015-06-29 DIAGNOSIS — Z862 Personal history of diseases of the blood and blood-forming organs and certain disorders involving the immune mechanism: Secondary | ICD-10-CM | POA: Diagnosis not present

## 2015-06-29 DIAGNOSIS — Z7951 Long term (current) use of inhaled steroids: Secondary | ICD-10-CM | POA: Diagnosis not present

## 2015-06-29 DIAGNOSIS — Z79899 Other long term (current) drug therapy: Secondary | ICD-10-CM | POA: Diagnosis not present

## 2015-06-29 DIAGNOSIS — J449 Chronic obstructive pulmonary disease, unspecified: Secondary | ICD-10-CM | POA: Diagnosis not present

## 2015-06-29 DIAGNOSIS — E119 Type 2 diabetes mellitus without complications: Secondary | ICD-10-CM | POA: Insufficient documentation

## 2015-06-29 DIAGNOSIS — Y9289 Other specified places as the place of occurrence of the external cause: Secondary | ICD-10-CM | POA: Insufficient documentation

## 2015-06-29 DIAGNOSIS — S61412A Laceration without foreign body of left hand, initial encounter: Secondary | ICD-10-CM | POA: Diagnosis not present

## 2015-06-29 DIAGNOSIS — E785 Hyperlipidemia, unspecified: Secondary | ICD-10-CM | POA: Diagnosis not present

## 2015-06-29 DIAGNOSIS — Z87891 Personal history of nicotine dependence: Secondary | ICD-10-CM | POA: Insufficient documentation

## 2015-06-29 DIAGNOSIS — Z872 Personal history of diseases of the skin and subcutaneous tissue: Secondary | ICD-10-CM | POA: Insufficient documentation

## 2015-06-29 DIAGNOSIS — Y998 Other external cause status: Secondary | ICD-10-CM | POA: Insufficient documentation

## 2015-06-29 DIAGNOSIS — Y9389 Activity, other specified: Secondary | ICD-10-CM | POA: Insufficient documentation

## 2015-06-29 DIAGNOSIS — M1990 Unspecified osteoarthritis, unspecified site: Secondary | ICD-10-CM | POA: Diagnosis not present

## 2015-06-29 DIAGNOSIS — F3181 Bipolar II disorder: Secondary | ICD-10-CM | POA: Diagnosis not present

## 2015-06-29 DIAGNOSIS — I10 Essential (primary) hypertension: Secondary | ICD-10-CM | POA: Diagnosis not present

## 2015-06-29 LAB — CBG MONITORING, ED: GLUCOSE-CAPILLARY: 339 mg/dL — AB (ref 65–99)

## 2015-06-29 MED ORDER — TETANUS-DIPHTH-ACELL PERTUSSIS 5-2.5-18.5 LF-MCG/0.5 IM SUSP
0.5000 mL | Freq: Once | INTRAMUSCULAR | Status: AC
  Administered 2015-06-29: 0.5 mL via INTRAMUSCULAR
  Filled 2015-06-29: qty 0.5

## 2015-06-29 MED ORDER — LIDOCAINE HCL (PF) 1 % IJ SOLN
5.0000 mL | Freq: Once | INTRAMUSCULAR | Status: AC
  Administered 2015-06-29: 5 mL via INTRADERMAL
  Filled 2015-06-29: qty 5
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
## 2015-06-29 NOTE — Discharge Instructions (Signed)
Laceration Care, Adult °A laceration is a cut or lesion that goes through all layers of the skin and into the tissue just beneath the skin. °TREATMENT  °Some lacerations may not require closure. Some lacerations may not be able to be closed due to an increased risk of infection. It is important to see your caregiver as soon as possible after an injury to minimize the risk of infection and maximize the opportunity for successful closure. °If closure is appropriate, pain medicines may be given, if needed. The wound will be cleaned to help prevent infection. Your caregiver will use stitches (sutures), staples, wound glue (adhesive), or skin adhesive strips to repair the laceration. These tools bring the skin edges together to allow for faster healing and a better cosmetic outcome. However, all wounds will heal with a scar. Once the wound has healed, scarring can be minimized by covering the wound with sunscreen during the day for 1 full year. °HOME CARE INSTRUCTIONS  °For sutures or staples: °· Keep the wound clean and dry. °· If you were given a bandage (dressing), you should change it at least once a day. Also, change the dressing if it becomes wet or dirty, or as directed by your caregiver. °· Wash the wound with soap and water 2 times a day. Rinse the wound off with water to remove all soap. Pat the wound dry with a clean towel. °· After cleaning, apply a thin layer of the antibiotic ointment as recommended by your caregiver. This will help prevent infection and keep the dressing from sticking. °· You may shower as usual after the first 24 hours. Do not soak the wound in water until the sutures are removed. °· Only take over-the-counter or prescription medicines for pain, discomfort, or fever as directed by your caregiver. °· Get your sutures or staples removed as directed by your caregiver. °For skin adhesive strips: °· Keep the wound clean and dry. °· Do not get the skin adhesive strips wet. You may bathe  carefully, using caution to keep the wound dry. °· If the wound gets wet, pat it dry with a clean towel. °· Skin adhesive strips will fall off on their own. You may trim the strips as the wound heals. Do not remove skin adhesive strips that are still stuck to the wound. They will fall off in time. °For wound adhesive: °· You may briefly wet your wound in the shower or bath. Do not soak or scrub the wound. Do not swim. Avoid periods of heavy perspiration until the skin adhesive has fallen off on its own. After showering or bathing, gently pat the wound dry with a clean towel. °· Do not apply liquid medicine, cream medicine, or ointment medicine to your wound while the skin adhesive is in place. This may loosen the film before your wound is healed. °· If a dressing is placed over the wound, be careful not to apply tape directly over the skin adhesive. This may cause the adhesive to be pulled off before the wound is healed. °· Avoid prolonged exposure to sunlight or tanning lamps while the skin adhesive is in place. Exposure to ultraviolet light in the first year will darken the scar. °· The skin adhesive will usually remain in place for 5 to 10 days, then naturally fall off the skin. Do not pick at the adhesive film. °You may need a tetanus shot if: °· You cannot remember when you had your last tetanus shot. °· You have never had a tetanus   shot. °If you get a tetanus shot, your arm may swell, get red, and feel warm to the touch. This is common and not a problem. If you need a tetanus shot and you choose not to have one, there is a rare chance of getting tetanus. Sickness from tetanus can be serious. °SEEK MEDICAL CARE IF:  °· You have redness, swelling, or increasing pain in the wound. °· You see a red line that goes away from the wound. °· You have yellowish-white fluid (pus) coming from the wound. °· You have a fever. °· You notice a bad smell coming from the wound or dressing. °· Your wound breaks open before or  after sutures have been removed. °· You notice something coming out of the wound such as wood or glass. °· Your wound is on your hand or foot and you cannot move a finger or toe. °SEEK IMMEDIATE MEDICAL CARE IF:  °· Your pain is not controlled with prescribed medicine. °· You have severe swelling around the wound causing pain and numbness or a change in color in your arm, hand, leg, or foot. °· Your wound splits open and starts bleeding. °· You have worsening numbness, weakness, or loss of function of any joint around or beyond the wound. °· You develop painful lumps near the wound or on the skin anywhere on your body. °MAKE SURE YOU:  °· Understand these instructions. °· Will watch your condition. °· Will get help right away if you are not doing well or get worse. °Document Released: 11/22/2005 Document Revised: 02/14/2012 Document Reviewed: 05/18/2011 °ExitCare® Patient Information ©2015 [employer]. This information is not intended to replace advice given to you by your health care provider. Make sure you discuss any questions you have with your health care provider. ° °

## 2015-06-29 NOTE — ED Provider Notes (Signed)
TIME SEEN: 5:15 AM  CHIEF COMPLAINT: Left hand laceration  HPI: Pt is 39 y.o. female with history of morbid obesity, hypertension, diabetes who presents to the emergency department with a left hand laceration that occurred just prior to arrival. Reports that she was distributing newspapers and cutting the plastic and around a stack of newspapers with a knife when she accidentally cut her left hand. She denies that this was intentional. No other injury. No numbness, tingling. Normal range of motion in her hand. She is right-hand dominant. Tetanus is not up-to-date.  ROS: See HPI Constitutional: no fever  Eyes: no drainage  ENT: no runny nose   Cardiovascular:  no chest pain  Resp: no SOB  GI: no vomiting GU: no dysuria Integumentary: no rash  Allergy: no hives  Musculoskeletal: no leg swelling  Neurological: no slurred speech ROS otherwise negative  PAST MEDICAL HISTORY/PAST SURGICAL HISTORY:  Past Medical History  Diagnosis Date  . Morbid obesity   . Alveolar hypoventilation   . Asthma   . GERD (gastroesophageal reflux disease)   . Rectal fissure   . Hypertension   . Costochondritis   . Cellulitis 08/2010-08/2011  . Depression   . Bipolar 2 disorder   . Obstructive sleep apnea   . Chest pain     NO CAD by cath 07/14/97, nl LV systolic fxn  . Diabetes mellitus 2000    Type 2, Uncontrolled  . Arthritis   . HLD (hyperlipidemia)   . Anemia   . COPD (chronic obstructive pulmonary disease)     MEDICATIONS:  Prior to Admission medications   Medication Sig Start Date End Date Taking? Authorizing Provider  ACCU-CHEK FASTCLIX LANCETS MISC Use to check blood sugar 2 times per day. 10/07/14   [name], MD  ACCU-CHEK SMARTVIEW test strip USE TO CHECK BLOOD SUGAR 4 TIMES A DAY 06/10/15   Alveda Reasons, MD  atorvastatin (LIPITOR) 20 MG tablet Take 20 mg by mouth daily.    Historical Provider, MD  carvedilol (COREG) 12.5 MG tablet TAKE 1 TABLET BY MOUTH TWICE DAILY WITH A MEAL  02/05/15   [name], MD  cetirizine (ZYRTEC) 10 MG tablet TAKE 1 TABLET BY MOUTH DAILY 05/08/15   [name], MD  cetirizine (ZYRTEC) 10 MG tablet TAKE 1 TABLET BY MOUTH DAILY 06/10/15   Alveda Reasons, MD  clotrimazole (LOTRIMIN) 1 % cream APPLY TOPICALLY 2 TIMES A DAY AS NEEDED FOR SKIN YEAST 06/10/15   Alveda Reasons, MD  cyclobenzaprine (FLEXERIL) 10 MG tablet TAKE 1 TABLET BY MOUTH TWICE DAILY AS NEEDED FOR MUSCLE SPASMS 03/06/15   [name], MD  EASY TOUCH PEN NEEDLES 31G X 8 MM MISC USE TO INJECT INSULIN, 7 TIMES A DAY, OR AS DIRECTED. 06/10/15   Alveda Reasons, MD  fluticasone (FLONASE) 50 MCG/ACT nasal spray Place 2 sprays into both nostrils daily. 05/21/14   Melony Overly, MD  gabapentin (NEURONTIN) 300 MG capsule TAKE 3 CAPSULES BY MOUTH EVERY MORNING AND AFTERNOON AND TAKE 4 CAPSULES EVERY EVENING 05/08/15   [name], MD  gabapentin (NEURONTIN) 300 MG capsule TAKE 3 CAPSULES BY MOUTH EVERY MORNING AND AFTERNOON AND TAKE 4 CAPSULES BY MOUTH EVERY EVENING. 06/10/15   Alveda Reasons, MD  HYDROcodone-acetaminophen [facility]) 10-325 MG per tablet Take 1 tablet by mouth every 8 (eight) hours as needed. 04/28/15   [name], MD  hydrocortisone cream 0.5 % Apply 1 application topically 2 (two) times daily as  needed for itching. To feet 02/26/15   [name], MD  ibuprofen (ADVIL,MOTRIN) 800 MG tablet Take 1 tablet (800 mg total) by mouth 3 (three) times daily with meals. 12/29/14   [name], PA-C  Insulin Glargine (LANTUS SOLOSTAR) 100 UNIT/ML Solostar Pen Inject 500 Units into the skin every morning. Inject 150 units twice daily. Patient taking differently: Inject 500 Units into the skin every morning.  01/27/15   [name], MD  lisinopril (PRINIVIL,ZESTRIL) 10 MG tablet Take 1 tablet (10 mg total) by mouth daily. 03/12/14   [name], MD  nitroGLYCERIN (NITROSTAT) 0.4 MG SL tablet DISSOLVE 1 TABLET UNDER THE TONGUE EVERY 5 MINUTES AS  NEEDED FOR CHEST PAIN. 04/03/15   [name], MD  pantoprazole (PROTONIX) 40 MG tablet TAKE 1 TABLET BY MOUTH EVERY DAY 05/08/15   [name], MD  pantoprazole (PROTONIX) 40 MG tablet TAKE 1 TABLET BY MOUTH EVERY DAY 06/10/15   Alveda Reasons, MD  PROCTOSOL HC 2.5 % rectal cream APPLY TO RECTAL AREA TWICE DAILY 06/10/15   Alveda Reasons, MD  PROVENTIL HFA 108 (90 BASE) MCG/ACT inhaler INHALE 1 PUFF BY MOUTH EVERY 6 HOURS AS NEEDED FOR WHEEZING OR SHORTNESS OF BREATH. 06/10/15   Alveda Reasons, MD  SYMBICORT 160-4.5 MCG/ACT inhaler INHALE 2 PUFFS INTO LUNGS TWICE A DAY 03/06/15   [name], MD  topiramate (TOPAMAX) 25 MG tablet TAKE 1 TABLET BY MOUTH EVERY NIGHT AT BEDTIME 06/10/15   Alveda Reasons, MD  traMADol (ULTRAM) 50 MG tablet Take 1 tablet by mouth every 6 hours as needed for severe pain in leg 06/24/15   [name], MD  VOLTAREN 1 % GEL APPLY 2 GRAMS TOPICALLY TO AFFECTED AREA TWICE DAILY 06/10/15   Alveda Reasons, MD    ALLERGIES:  Allergies  Allergen Reactions  . Kiwi Extract Anaphylaxis  . Nubain [Nalbuphine Hcl] Other (See Comments)    Reaction:  Nervousness    SOCIAL HISTORY:  History  Substance Use Topics  . Smoking status: Former Smoker -- 0.30 packs/day for .3 years    Types: Cigarettes    Quit date: 12/06/1993  . Smokeless tobacco: Never Used  . Alcohol Use: No    FAMILY HISTORY: Family History  Problem Relation Age of Onset  . Diabetes Mother   . Hyperlipidemia Mother   . Depression Mother   . Heart attack Paternal Uncle   . Heart disease Paternal Grandmother   . Heart attack Paternal Grandmother   . Heart attack Paternal Grandfather   . Heart disease Paternal Grandfather     EXAM: Pulse 113  Temp(Src) 98.4 F (36.9 C)  Resp 17  Ht 5\' 2"  (1.575 m)  Wt 312 lb (141.522 kg)  BMI 57.05 kg/m2  SpO2 97%  LMP 05/15/2015 (Exact Date) CONSTITUTIONAL: Alert and oriented and responds appropriately to questions. Well-appearing;  well-nourished, morbidly obese HEAD: Normocephalic EYES: Conjunctivae clear, PERRL ENT: normal nose; no rhinorrhea; moist mucous membranes; pharynx without lesions noted NECK: Supple, no meningismus, no LAD  CARD: Regular minimally tachycardic; S1 and S2 appreciated; no murmurs, no clicks, no rubs, no gallops RESP: Normal chest excursion without splinting or tachypnea; breath sounds clear and equal bilaterally; no wheezes, no rhonchi, no rales, no hypoxia or respiratory distress, speaking full sentences ABD/GI: Normal bowel sounds; non-distended; soft, non-tender, no rebound, no guarding, no peritoneal signs BACK:  The back appears normal and is non-tender to palpation, there is no CVA tenderness EXT:  Normal range of motion in the thumb and no sign of tendon involvement with a laceration over the hypothenarNormal ROM in all joints; non-tender to palpation; no edema; normal capillary refill; no cyanosis, no calf tenderness or swelling; 2+ radial pulses bilaterally and no sign of bony injury or foreign body in the laceration SKIN: Normal color for age and race; warm; for severe superficial laceration to the hypothenar eminence without sign of tendon involvement NEURO: Moves all extremities equally, sensation to light touch intact diffusely, cranial nerves II through XII intact PSYCH: The patient's mood and manner are appropriate. Grooming and personal hygiene are appropriate.  MEDICAL DECISION MAKING: Patient here with for severe simple laceration to the left hypo-thenar eminence. No sign of tendon involvement. No sign of bony injury. No foreign body. Will update tetanus and repair wound. No other sign of injury on exam. Discussed return precautions, supportive care instructions. She verbalized understanding and is comfortable with plan.     LACERATION REPAIR Performed by: Lattie Haw, [employer] student.  I was present for the entire procedure. Authorized by: Nyra Jabs Consent: Verbal consent  obtained. Risks and benefits: risks, benefits and alternatives were discussed Consent given by: patient Patient identity confirmed: provided demographic data Prepped and Draped in normal sterile fashion Wound explored  Laceration Location: left hand  Laceration Length: 4 cm  No Foreign Bodies seen or palpated  Anesthesia: local infiltration  Local anesthetic: lidocaine 1% without epinephrine  Anesthetic total: 2 ml  Irrigation method: syringe Amount of cleaning: standard  Skin closure: simple  Number of sutures: 6  Technique: Area irrigated with saline, cleaned with Betadine, repaired using 6 4-0 Prolene simple interrupted sutures. Wound hemostatic. Bacitracin and sterile dressing applied.   Patient tolerance: Patient tolerated the procedure well with no immediate complications.   [place], DO 06/29/15 [phone]

## 2015-07-04 ENCOUNTER — Ambulatory Visit (INDEPENDENT_AMBULATORY_CARE_PROVIDER_SITE_OTHER): Payer: Medicaid Other | Admitting: *Deleted

## 2015-07-04 DIAGNOSIS — R51 Headache: Secondary | ICD-10-CM | POA: Diagnosis not present

## 2015-07-04 DIAGNOSIS — I10 Essential (primary) hypertension: Secondary | ICD-10-CM | POA: Diagnosis not present

## 2015-07-04 DIAGNOSIS — G2581 Restless legs syndrome: Secondary | ICD-10-CM | POA: Diagnosis not present

## 2015-07-04 DIAGNOSIS — S61411A Laceration without foreign body of right hand, initial encounter: Secondary | ICD-10-CM | POA: Diagnosis not present

## 2015-07-04 DIAGNOSIS — Z7189 Other specified counseling: Secondary | ICD-10-CM | POA: Diagnosis present

## 2015-07-04 DIAGNOSIS — L089 Local infection of the skin and subcutaneous tissue, unspecified: Secondary | ICD-10-CM | POA: Diagnosis not present

## 2015-07-04 NOTE — Progress Notes (Signed)
Patient had right hand laceration and received sutures in ED on 06/29/15.  Chart states 6 sutures were placed, but patient states it was only 5 sutures and doesn't recall any falling out.  Site well-approximated without any drainage or swelling.  Removed 5 sutures without difficulty and patient tolerated well.  Precepted with Dr. Gwendlyn Deutscher and advised to avoid heavy lifting or overuse with hand for additional week.  Burna Forts, BSN, RN-BC

## 2015-07-10 ENCOUNTER — Encounter: Payer: Self-pay | Admitting: Vascular Surgery

## 2015-07-11 ENCOUNTER — Ambulatory Visit (INDEPENDENT_AMBULATORY_CARE_PROVIDER_SITE_OTHER): Payer: Medicaid Other | Admitting: Vascular Surgery

## 2015-07-11 ENCOUNTER — Ambulatory Visit (HOSPITAL_COMMUNITY)
Admission: RE | Admit: 2015-07-11 | Discharge: 2015-07-11 | Disposition: A | Discharge status: Home / Self Care | Payer: Medicaid Other | Source: Ambulatory Visit | Attending: Vascular Surgery | Admitting: Vascular Surgery

## 2015-07-11 ENCOUNTER — Encounter: Payer: Self-pay | Admitting: Vascular Surgery

## 2015-07-11 VITALS — BP 142/105 | HR 105 | Temp 98.4°F | Resp 18 | Ht 62.0 in | Wt 305.0 lb

## 2015-07-11 DIAGNOSIS — I8390 Asymptomatic varicose veins of unspecified lower extremity: Secondary | ICD-10-CM

## 2015-07-11 DIAGNOSIS — I83893 Varicose veins of bilateral lower extremities with other complications: Secondary | ICD-10-CM

## 2015-07-11 DIAGNOSIS — I8393 Asymptomatic varicose veins of bilateral lower extremities: Secondary | ICD-10-CM | POA: Diagnosis not present

## 2015-07-11 DIAGNOSIS — I739 Peripheral vascular disease, unspecified: Secondary | ICD-10-CM | POA: Diagnosis not present

## 2015-07-11 NOTE — Progress Notes (Signed)
Referred by:  [name], MD [address] [place], [place] [address]  Reason for referral: Painful varicose veins in R leg  History of Present Illness  [name] is a 39 y.o. (1976-07-27) female who presents with chief complaint: painful varicose veins in R leg.  Patient has had varicose veins for years but has had increasing amount of pain in R posterior knee and medial thigh over last 2-3 months.  Pain vague in character with mild to moderate intensity.  The patient denies significant swelling or pruritus.  The patient has had no history of DVT, known history of pregnancy, known history of varicose vein, no history of venous stasis ulcers, no history of  Lymphedema and no history of skin changes in lower legs.  There is known family history of venous disorders.  The patient has never used compression stockings in the past.  She has tried TED hose in the past.  Past Medical History  Diagnosis Date  . Morbid obesity   . Alveolar hypoventilation   . Asthma   . GERD (gastroesophageal reflux disease)   . Rectal fissure   . Hypertension   . Costochondritis   . Cellulitis 08/2010-08/2011  . Depression   . Bipolar 2 disorder   . Obstructive sleep apnea   . Chest pain     NO CAD by cath 3/97/67, nl LV systolic fxn  . Diabetes mellitus 2000    Type 2, Uncontrolled  . Arthritis   . HLD (hyperlipidemia)   . Anemia   . COPD (chronic obstructive pulmonary disease)   . Varicose veins     Right medial thigh and Left leg     Past Surgical History  Procedure Laterality Date  . Carpal tunnel release    . Bil knee surgery    . Mass excision N/A 06/29/2013    Procedure:  WIDE LOCAL EXCISION OF POSTERIOR NECK ABSCESS;  Surgeon: Ralene Ok, MD;  Location: [place];  Service: General;  Laterality: N/A;  . Cardiac catheterization    . Left heart catheterization with coronary angiogram N/A 07/27/2012    Procedure: LEFT HEART CATHETERIZATION WITH CORONARY ANGIOGRAM;  Surgeon:  Sherren Mocha, MD;  [facility] CATH LAB;  Service: Cardiovascular;  Laterality: N/A;    History   Social History  . Marital Status: Married    Spouse Name: N/A  . Number of Children: N/A  . Years of Education: N/A   Occupational History  . Not on file.   Social History Main Topics  . Smoking status: Former Smoker -- 0.30 packs/day for .3 years    Types: Cigarettes    Quit date: 12/06/1993  . Smokeless tobacco: Never Used  . Alcohol Use: No  . Drug Use: No  . Sexual Activity:    Partners: Male   Other Topics Concern  . Not on file   Social History Narrative   Lives in [place] with her fiance and 37 yr old dtr.    Family History  Problem Relation Age of Onset  . Diabetes Mother   . Hyperlipidemia Mother   . Depression Mother   . Varicose Veins Mother   . Heart attack Paternal Uncle   . Heart disease Paternal Grandmother   . Heart attack Paternal Grandmother   . Heart attack Paternal Grandfather   . Heart disease Paternal Grandfather   . Heart attack Father      Current Outpatient Prescriptions on File Prior to Visit  Medication Sig Dispense Refill  .  ACCU-CHEK FASTCLIX LANCETS MISC Use to check blood sugar 2 times per day. 100 each 2  . ACCU-CHEK SMARTVIEW test strip USE TO CHECK BLOOD SUGAR 4 TIMES A DAY 100 each 11  . atorvastatin (LIPITOR) 20 MG tablet Take 20 mg by mouth daily.    . carvedilol (COREG) 12.5 MG tablet TAKE 1 TABLET BY MOUTH TWICE DAILY WITH A MEAL 60 tablet 4  . cetirizine (ZYRTEC) 10 MG tablet TAKE 1 TABLET BY MOUTH DAILY 90 tablet 2  . cetirizine (ZYRTEC) 10 MG tablet TAKE 1 TABLET BY MOUTH DAILY 30 tablet 0  . clotrimazole (LOTRIMIN) 1 % cream APPLY TOPICALLY 2 TIMES A DAY AS NEEDED FOR SKIN YEAST 60 g 0  . cyclobenzaprine (FLEXERIL) 10 MG tablet TAKE 1 TABLET BY MOUTH TWICE DAILY AS NEEDED FOR MUSCLE SPASMS 30 tablet 3  . EASY TOUCH PEN NEEDLES 31G X 8 MM MISC USE TO INJECT INSULIN, 7 TIMES A DAY, OR AS DIRECTED. 200 each 10  . fluticasone  (FLONASE) 50 MCG/ACT nasal spray Place 2 sprays into both nostrils daily. 16 g 6  . gabapentin (NEURONTIN) 300 MG capsule TAKE 3 CAPSULES BY MOUTH EVERY MORNING AND AFTERNOON AND TAKE 4 CAPSULES EVERY EVENING 300 capsule 1  . gabapentin (NEURONTIN) 300 MG capsule TAKE 3 CAPSULES BY MOUTH EVERY MORNING AND AFTERNOON AND TAKE 4 CAPSULES BY MOUTH EVERY EVENING. 300 capsule 0  . HYDROcodone-acetaminophen (NORCO) 10-325 MG per tablet Take 1 tablet by mouth every 8 (eight) hours as needed. 90 tablet 0  . hydrocortisone cream 0.5 % Apply 1 application topically 2 (two) times daily as needed for itching. To feet 30 g 0  . ibuprofen (ADVIL,MOTRIN) 800 MG tablet Take 1 tablet (800 mg total) by mouth 3 (three) times daily with meals. 21 tablet 0  . Insulin Glargine (LANTUS SOLOSTAR) 100 UNIT/ML Solostar Pen Inject 500 Units into the skin every morning. Inject 150 units twice daily. (Patient taking differently: Inject 500 Units into the skin every morning. ) 60 pen 11  . lisinopril (PRINIVIL,ZESTRIL) 10 MG tablet Take 1 tablet (10 mg total) by mouth daily. 30 tablet 5  . nitroGLYCERIN (NITROSTAT) 0.4 MG SL tablet DISSOLVE 1 TABLET UNDER THE TONGUE EVERY 5 MINUTES AS NEEDED FOR CHEST PAIN. 30 tablet 1  . pantoprazole (PROTONIX) 40 MG tablet TAKE 1 TABLET BY MOUTH EVERY DAY 90 tablet 2  . pantoprazole (PROTONIX) 40 MG tablet TAKE 1 TABLET BY MOUTH EVERY DAY 90 tablet 0  . PROCTOSOL HC 2.5 % rectal cream APPLY TO RECTAL AREA TWICE DAILY 28.35 g 0  . PROVENTIL HFA 108 (90 BASE) MCG/ACT inhaler INHALE 1 PUFF BY MOUTH EVERY 6 HOURS AS NEEDED FOR WHEEZING OR SHORTNESS OF BREATH. 18 g 0  . SYMBICORT 160-4.5 MCG/ACT inhaler INHALE 2 PUFFS INTO LUNGS TWICE A DAY 10.2 g 3  . topiramate (TOPAMAX) 25 MG tablet TAKE 1 TABLET BY MOUTH EVERY NIGHT AT BEDTIME 30 tablet 0  . traMADol (ULTRAM) 50 MG tablet Take 1 tablet by mouth every 6 hours as needed for severe pain in leg 60 tablet 0  . VOLTAREN 1 % GEL APPLY 2 GRAMS TOPICALLY  TO AFFECTED AREA TWICE DAILY 100 g 0   No current facility-administered medications on file prior to visit.    Allergies  Allergen Reactions  . Kiwi Extract Anaphylaxis  . Nubain [Nalbuphine Hcl] Other (See Comments)    Reaction:  Nervousness    REVIEW OF SYSTEMS:  (Positives checked otherwise negative)  CARDIOVASCULAR:   [ ]   chest pain,  [ ]  chest pressure,  [ ]  palpitations,  [ ]  shortness of breath when laying flat,  [ ]  shortness of breath with exertion,   [x]  pain in feet when walking,  [x]  pain in feet when laying flat, [ ]  history of blood clot in veins (DVT),  [ ]  history of phlebitis,  [ ]  swelling in legs,  [x]  varicose veins  PULMONARY:   [ ]  productive cough,  [x]  asthma,  [ ]  wheezing  NEUROLOGIC:   [ ]  weakness in arms or legs,  [ ]  numbness in arms or legs,  [ ]  difficulty speaking or slurred speech,  [ ]  temporary loss of vision in one eye,  [ ]  dizziness  HEMATOLOGIC:   [ ]  bleeding problems,  [ ]  problems with blood clotting too easily  MUSCULOSKEL:   [ ]  joint pain, [ ]  joint swelling  GASTROINTEST:   [ ]  vomiting blood,  [ ]  blood in stool     GENITOURINARY:   [ ]  burning with urination,  [ ]  blood in urine  PSYCHIATRIC:   [x]  history of major depression  INTEGUMENTARY:   [ ]  rashes,  [ ]  ulcers  CONSTITUTIONAL:   [ ]  fever,  [ ]  chills   Physical Examination Filed Vitals:   07/11/15 1149 07/11/15 1156  BP: 151/104 142/105  Pulse: 106 105  Temp: 98.4 F (36.9 C)   TempSrc: Oral   Resp: 18   Height: 5\' 2"  (1.575 m)   Weight: 305 lb (138.347 kg)   SpO2: 100%    Body mass index is 55.77 kg/(m^2).  General: A&O x 3, WD, morbidly obse  Head: [place]/AT  Ear/Nose/Throat: Hearing grossly intact, nares w/o erythema or drainage, oropharynx w/o Erythema/Exudate  Eyes: PERRLA, EOMI  Neck: Supple, no nuchal rigidity, no palpable LAD  Pulmonary: Sym exp, good air movt, CTAB, no rales, rhonchi, & wheezing  Cardiac: RRR, Nl  S1, S2, no Murmurs, rubs or gallops  Vascular: Vessel Right Left  Radial Palpable Palpable  Brachial Palpable Palpable  Carotid Palpable, without bruit Palpable, without bruit  Aorta Not palpable due to obesity N/A  Femoral Not Palpable due to pannus Not Palpable due to pannus  Popliteal Not palpable Not palpable  PT Not Palpable Not Palpable  DP Faintly Palpable Faintly Palpable   Gastrointestinal: soft, NTND, -G/R, - HSM, - masses, - CVAT B  Musculoskeletal: M/S 5/5 throughout , Extremities without ischemic changes , L anterior calf SVT, bilateral varicosities evident, RLE edema > LLE, 1+ edema, no LDS  Neurologic: CN grossly intact, Pain and light touch intact in extremities , Motor exam as listed above  Psychiatric: Judgment intact, Mood & affect appropriate for pt's clinical situation  Dermatologic: See M/S exam for extremity exam, no rashes otherwise noted  Lymph : No Cervical, Axillary, or Inguinal lymphadenopathy    Non-Invasive Vascular Imaging  BLE Venous Insufficiency Duplex (Date: 07/11/2015):   RLE: no DVT and SVT, + GSV reflux, + deep venous reflux  LLE: no DVT and SVT, + GSV reflux, + deep venous reflux   Medical Decision Making  [name] is a 39 y.o. female who presents with: BLE chronic venous insufficiency (C2), possible PAD   Pt's history is not classic for CVI.  Given the difficulty feeling pulses, would obtain a ABI also to check for PAD  She will follow-up in 4 weeks for this study.  Based on the patient's history and examination, I recommend: compressive.  I discussed  with the patient the use of her 20-30 mm thigh high compression stockings and need for 3 month trial of such.  The patient will follow up in 3 months with my partners in the [facility] for evaluation for: EVLA R GSV.    Given the atypical sx, I'm not certain ablating the R GSV will resolve this patient's sx.  Thank you for allowing [place] to participate in this patient's  care.  Adele Barthel, MD [facility] Office: [phone] Pager: [phone]  07/11/2015, 12:12 PM

## 2015-07-14 ENCOUNTER — Other Ambulatory Visit: Payer: Self-pay | Admitting: Family Medicine

## 2015-07-14 NOTE — Addendum Note (Signed)
Addended by: Dorthula Rue L on: 07/14/2015 11:54 AM   Modules accepted: Orders

## 2015-07-14 NOTE — Telephone Encounter (Signed)
Did you want to prescribe?

## 2015-07-15 ENCOUNTER — Other Ambulatory Visit: Payer: Self-pay | Admitting: Family Medicine

## 2015-07-16 ENCOUNTER — Other Ambulatory Visit: Payer: Self-pay | Admitting: Family Medicine

## 2015-07-18 NOTE — Telephone Encounter (Signed)
Pt. Called after hours line this am. She states that she continues to have pain in her leg with the varicose vein. She says it is uncontrolled by pain medication. She says that she saw the vascular surgeon recently, but that they are not planning to intervene surgically at this time. She says she does not know what to do as her pain continues to bother her, and that it is very severe. She is frustrated because she does not feel that the pain medicine is helping at this time. She does not have shortness of breath, or lower extremity swelling. No redness. Ultrasound on 7/11 without evidence of DVT.   Recommendations:  - Call clinic to schedule same day appointment for further evaluation.  - If unable to get a same day appointment, she may go to urgent care or the ED if her pain remains severe as this may represent further impaired blood flow, DVT, or compartment syndrome.  - She acknowledged understanding this.  - Will forward to PCP   Paula Compton, MD Family Medicine - PGY 2

## 2015-07-22 ENCOUNTER — Encounter (HOSPITAL_COMMUNITY): Payer: Self-pay | Admitting: Emergency Medicine

## 2015-07-22 ENCOUNTER — Emergency Department (HOSPITAL_COMMUNITY)
Admission: EM | Admit: 2015-07-22 | Discharge: 2015-07-22 | Discharge status: Against Medical Advice | Payer: Medicaid Other | Attending: Emergency Medicine | Admitting: Emergency Medicine

## 2015-07-22 DIAGNOSIS — J449 Chronic obstructive pulmonary disease, unspecified: Secondary | ICD-10-CM | POA: Insufficient documentation

## 2015-07-22 DIAGNOSIS — M79604 Pain in right leg: Secondary | ICD-10-CM | POA: Insufficient documentation

## 2015-07-22 DIAGNOSIS — E119 Type 2 diabetes mellitus without complications: Secondary | ICD-10-CM | POA: Diagnosis not present

## 2015-07-22 DIAGNOSIS — M79605 Pain in left leg: Secondary | ICD-10-CM | POA: Insufficient documentation

## 2015-07-22 DIAGNOSIS — I10 Essential (primary) hypertension: Secondary | ICD-10-CM | POA: Insufficient documentation

## 2015-07-22 NOTE — ED Notes (Signed)
Pt arrived to the ED with a complaint of bilateral leg pain.  Pt states that she is seeing a vascular doctor for venous issues.  Pt states that the pain has been present for two month.  Pt states the left leg hurts around her knee.  Pt states that the right leg hurts behind her knee towards her groin

## 2015-07-24 ENCOUNTER — Telehealth: Payer: Self-pay | Admitting: Family Medicine

## 2015-07-24 DIAGNOSIS — G8929 Other chronic pain: Secondary | ICD-10-CM

## 2015-07-24 NOTE — Telephone Encounter (Signed)
Covering Dr. Lennie Odor box.  Will defer to PCP to discuss as she knows patient much better.

## 2015-07-24 NOTE — Telephone Encounter (Signed)
Amber from [place] calling to speak to Dr. [name] about patient. Amber states she has faxed over consent signed by patient giving authorization to speak to PCP. Please call Amber at [phone].

## 2015-07-28 ENCOUNTER — Encounter: Payer: Medicaid Other | Admitting: Surgery

## 2015-07-28 ENCOUNTER — Encounter (HOSPITAL_COMMUNITY): Payer: Medicaid Other

## 2015-07-28 NOTE — Telephone Encounter (Signed)
I have not received any signed consent forms. Red team, can you call Amber back and ask her to refax forms? Once we have received them I will be happy to call her back.  [name], MD

## 2015-07-29 NOTE — Telephone Encounter (Signed)
[name] called back and I spoke with her. Evidently there is a CPS case regarding pt, and they reviewed her medication list and saw there are two narcotic substances listed (tramadol and hydrocodone). They needed to confirm that we do not have any suspicions about narcotic misuse in patient. I advised that pt has never given me reason to suspect misuse. Also, after the call I reviewed [place] controlled substance database, which had appropriate findings.  [name], MD

## 2015-07-29 NOTE — Telephone Encounter (Signed)
Received consent forms via fax. [employer], no answer. Left voicemail advising I was returning her call and she is welcome to call us back.  [name], MD

## 2015-07-29 NOTE — Telephone Encounter (Signed)
Amber will resend now. Fleeger, Salome Spotted

## 2015-08-02 ENCOUNTER — Telehealth: Payer: Self-pay | Admitting: Family Medicine

## 2015-08-02 ENCOUNTER — Encounter (HOSPITAL_COMMUNITY): Payer: Self-pay | Admitting: Emergency Medicine

## 2015-08-02 ENCOUNTER — Emergency Department (HOSPITAL_COMMUNITY)
Admission: EM | Admit: 2015-08-02 | Discharge: 2015-08-02 | Disposition: A | Discharge status: Home / Self Care | Payer: Medicaid Other | Attending: Emergency Medicine | Admitting: Emergency Medicine

## 2015-08-02 DIAGNOSIS — Z7952 Long term (current) use of systemic steroids: Secondary | ICD-10-CM | POA: Diagnosis not present

## 2015-08-02 DIAGNOSIS — E0842 Diabetes mellitus due to underlying condition with diabetic polyneuropathy: Secondary | ICD-10-CM | POA: Diagnosis not present

## 2015-08-02 DIAGNOSIS — K219 Gastro-esophageal reflux disease without esophagitis: Secondary | ICD-10-CM | POA: Insufficient documentation

## 2015-08-02 DIAGNOSIS — I83813 Varicose veins of bilateral lower extremities with pain: Secondary | ICD-10-CM | POA: Diagnosis not present

## 2015-08-02 DIAGNOSIS — M79604 Pain in right leg: Secondary | ICD-10-CM

## 2015-08-02 DIAGNOSIS — Z872 Personal history of diseases of the skin and subcutaneous tissue: Secondary | ICD-10-CM | POA: Insufficient documentation

## 2015-08-02 DIAGNOSIS — M1990 Unspecified osteoarthritis, unspecified site: Secondary | ICD-10-CM | POA: Diagnosis not present

## 2015-08-02 DIAGNOSIS — Z79899 Other long term (current) drug therapy: Secondary | ICD-10-CM | POA: Insufficient documentation

## 2015-08-02 DIAGNOSIS — I10 Essential (primary) hypertension: Secondary | ICD-10-CM | POA: Insufficient documentation

## 2015-08-02 DIAGNOSIS — Z8669 Personal history of other diseases of the nervous system and sense organs: Secondary | ICD-10-CM | POA: Diagnosis not present

## 2015-08-02 DIAGNOSIS — Z9889 Other specified postprocedural states: Secondary | ICD-10-CM | POA: Diagnosis not present

## 2015-08-02 DIAGNOSIS — J449 Chronic obstructive pulmonary disease, unspecified: Secondary | ICD-10-CM | POA: Insufficient documentation

## 2015-08-02 DIAGNOSIS — Z7951 Long term (current) use of inhaled steroids: Secondary | ICD-10-CM | POA: Diagnosis not present

## 2015-08-02 DIAGNOSIS — E785 Hyperlipidemia, unspecified: Secondary | ICD-10-CM | POA: Diagnosis not present

## 2015-08-02 DIAGNOSIS — Z87891 Personal history of nicotine dependence: Secondary | ICD-10-CM | POA: Diagnosis not present

## 2015-08-02 DIAGNOSIS — Z791 Long term (current) use of non-steroidal anti-inflammatories (NSAID): Secondary | ICD-10-CM | POA: Diagnosis not present

## 2015-08-02 DIAGNOSIS — Z794 Long term (current) use of insulin: Secondary | ICD-10-CM | POA: Insufficient documentation

## 2015-08-02 DIAGNOSIS — M79605 Pain in left leg: Secondary | ICD-10-CM

## 2015-08-02 DIAGNOSIS — F3181 Bipolar II disorder: Secondary | ICD-10-CM | POA: Diagnosis not present

## 2015-08-02 DIAGNOSIS — Z862 Personal history of diseases of the blood and blood-forming organs and certain disorders involving the immune mechanism: Secondary | ICD-10-CM | POA: Insufficient documentation

## 2015-08-02 DIAGNOSIS — I8390 Asymptomatic varicose veins of unspecified lower extremity: Secondary | ICD-10-CM

## 2015-08-02 IMAGING — US US ABDOMEN COMPLETE
1 series · 14 of 25 positions shown · non-contrast
Comparison: CT, 12/03/2013.

CLINICAL DATA: Right upper quadrant abdominal pain.

EXAM:
ULTRASOUND ABDOMEN COMPLETE

[Series 1: us abdomen complete · 0.28mm/px · 14 of 56 slices shown]
[im 1/56]
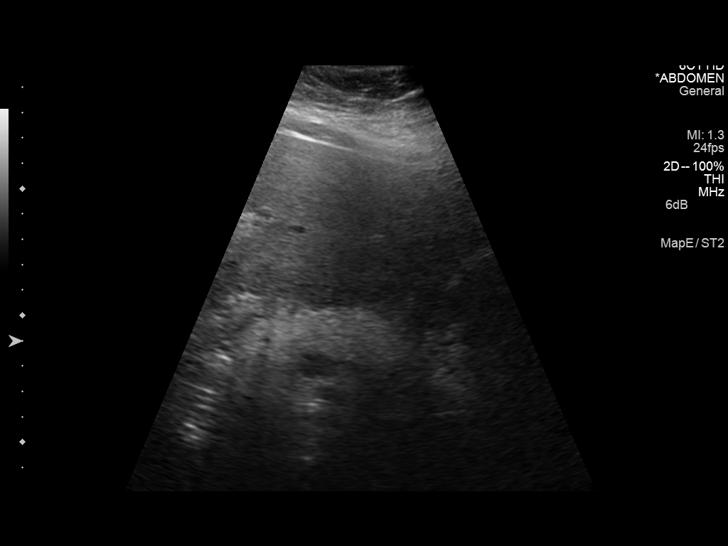
[im 5/56]
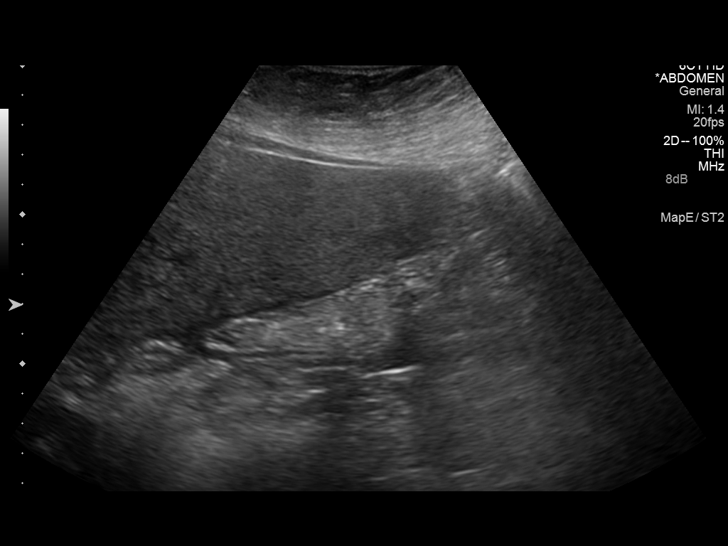
[im 10/56]
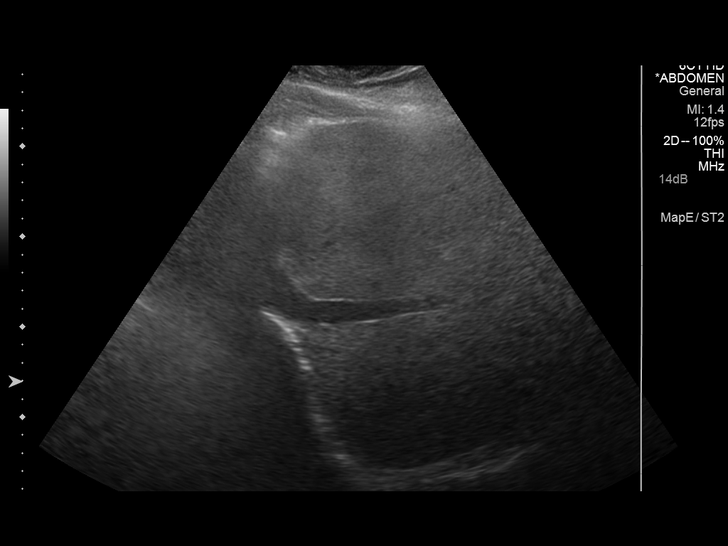
[im 14/56]
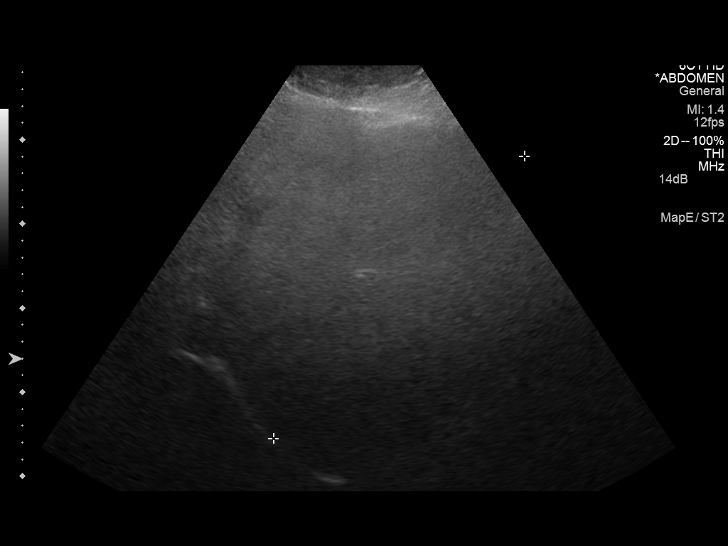
[im 19/56]
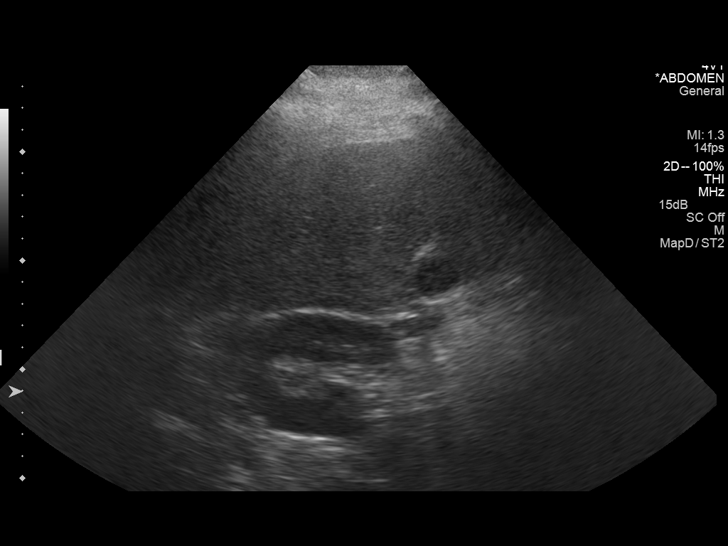
[im 21/56]
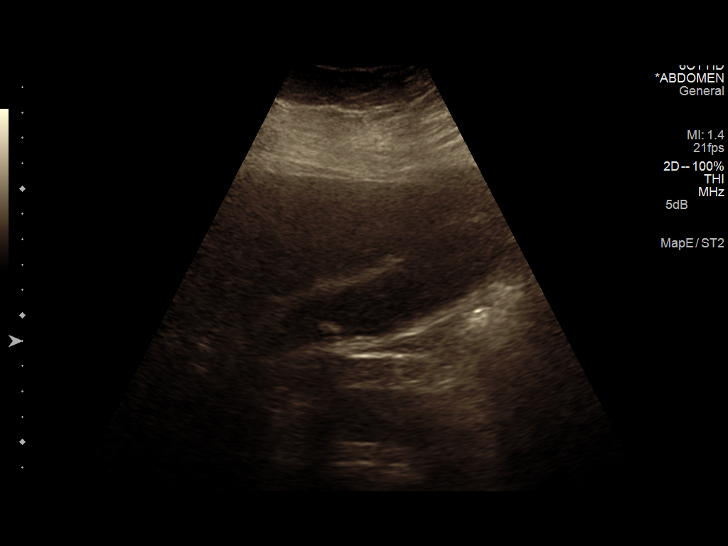
[im 26/56]
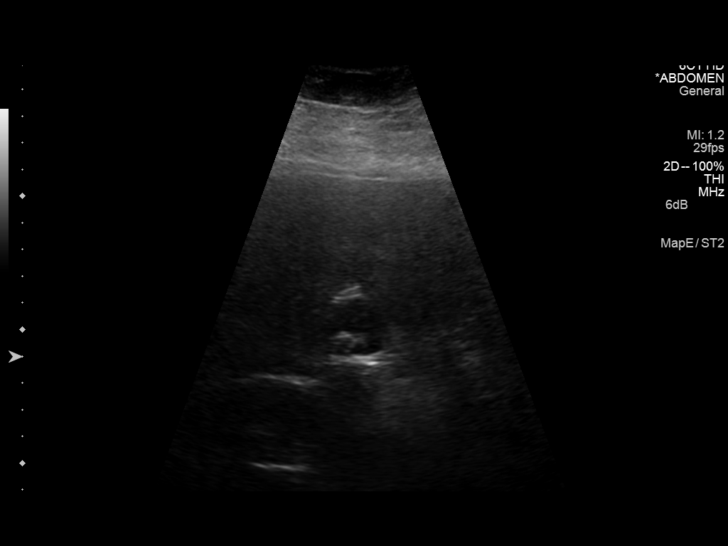
[im 30/56]
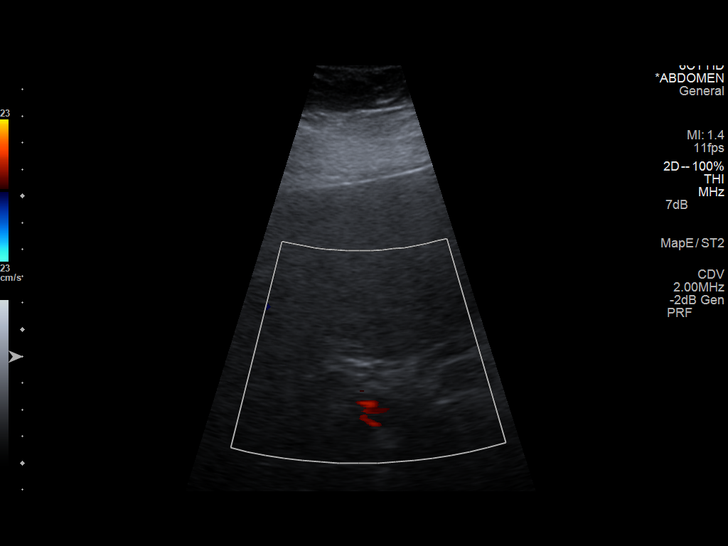
[im 35/56]
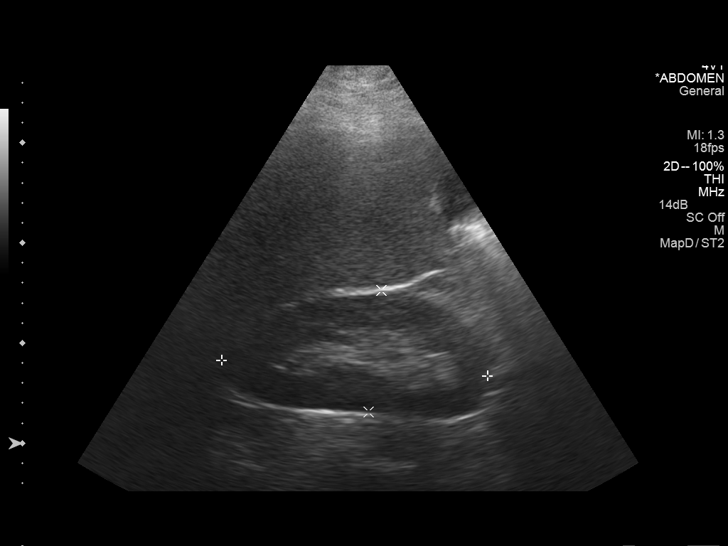
[im 37/56]
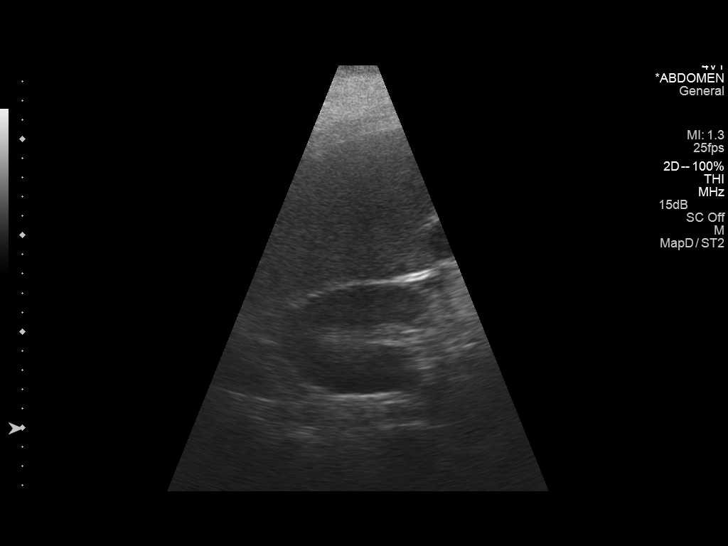
[im 42/56]
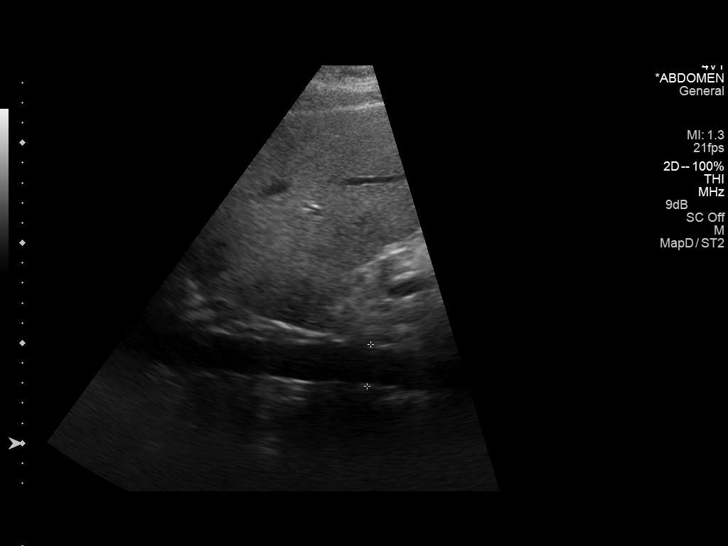
[im 46/56]
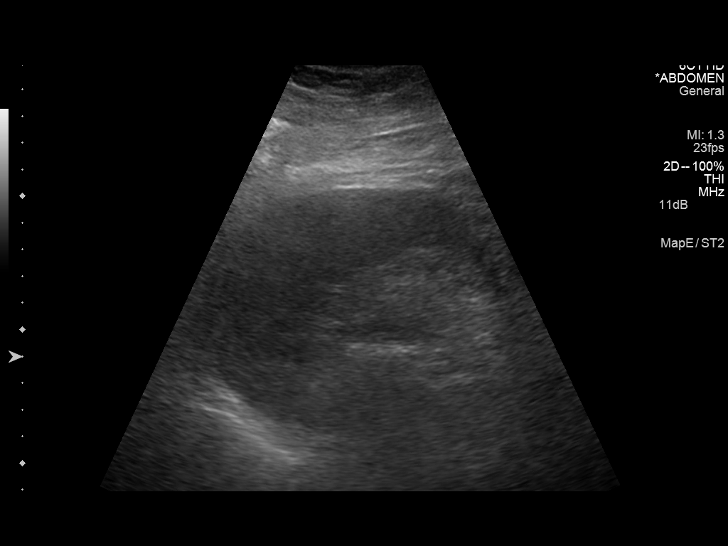
[im 51/56]
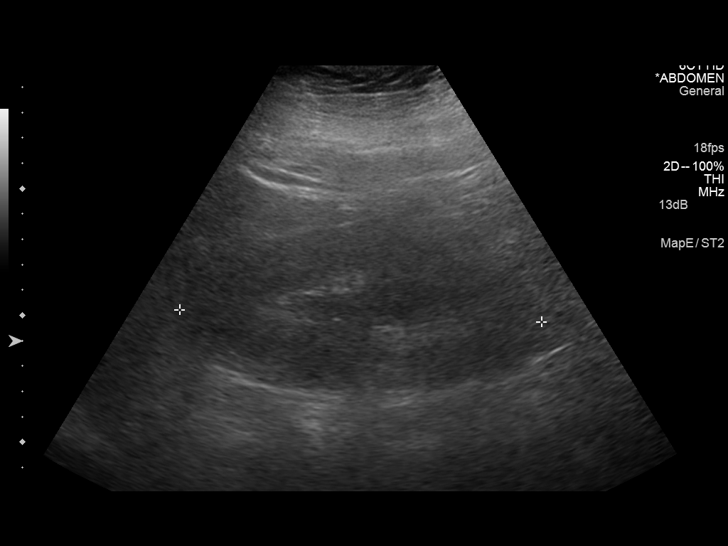
[im 56/56]
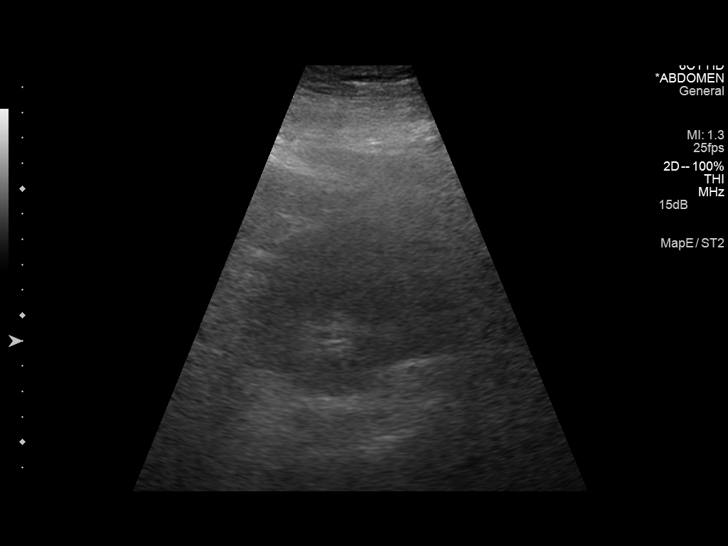

[14 of 25 positions shown; findings below may reference images not displayed]

FINDINGS: Gallbladder:

Small gallstones. No gallbladder wall thickening. No pericholecystic
fluid. No evidence of acute cholecystitis.

Common bile duct:

Diameter: 3.5 mm

Liver:

Liver is echogenic and mildly enlarged. No liver mass or focal
lesion.

IVC:

No abnormality visualized.

Pancreas:

Incompletely imaged.  Visualized portions are unremarkable.

Spleen:

Size and appearance within normal limits.

Right Kidney:

Length: 13.3 cm. Echogenicity within normal limits. No mass or
hydronephrosis visualized.

Left Kidney:

Length: 14.3 cm. Echogenicity within normal limits. No mass or
hydronephrosis visualized.

Abdominal aorta:

No aneurysm visualized.

Other findings:

None.
IMPRESSION: 1. Cholelithiasis.  No evidence of acute cholecystitis.
2. No acute findings.
3. Hepatomegaly and diffuse hepatic steatosis.

## 2015-08-02 MED ORDER — GABAPENTIN 300 MG PO CAPS: 1200.0000 mg | ORAL_CAPSULE | Freq: Three times a day (TID) | ORAL | Status: DC

## 2015-08-02 MED ORDER — KETOROLAC TROMETHAMINE 60 MG/2ML IM SOLN
60.0000 mg | Freq: Once | INTRAMUSCULAR | Status: AC
  Administered 2015-08-02: 60 mg via INTRAMUSCULAR
  Filled 2015-08-02: qty 2

## 2015-08-02 MED ORDER — OXYCODONE-ACETAMINOPHEN 5-325 MG PO TABS
2.0000 | ORAL_TABLET | Freq: Once | ORAL | Status: AC
Start: 2015-08-02 — End: 2015-08-02
  Administered 2015-08-02: 2 via ORAL
  Filled 2015-08-02: qty 2

## 2015-08-02 NOTE — ED Notes (Signed)
Patient here with complaint of bilateral lower extremity pain. States recent history of evaluation for vascular issues and was told she may have PAD. Over the last two days patient has begun to have increased pain with walking. Reports that this a new symptom. Currently taking several medications for this pain without effect; gabapentin, Vicodin, tramadol, flexeril.

## 2015-08-02 NOTE — Telephone Encounter (Signed)
Family Medicine Emergency Line Telephone Note  Patient calls to discuss pain in b/l legs.  She reports that pain has been intermittent for 2-3 months.  PCP sent to [facility] for evaluation and she was told it was varicose veins.  She reports that she has felt unable to walk on her legs for past 2-3 days 2/2 pain and is having a new burning pain starting tonight.  Denies any weakness or numbness in legs.  Legs hurt worse at night while resting.  Has taken vicodin, tramadol, and flexeril for pain, but this has not helped.  Advised patient that if pain is worsening and changing, she should be evaluated in the emergency department.  Virginia Crews, MD, MPH PGY-2,  [facility] 08/02/2015 3:39 AM

## 2015-08-02 NOTE — ED Provider Notes (Signed)
CSN: [id number]     Arrival date & time 08/02/15  7858 History   First MD Initiated Contact with Patient 08/02/15 0809     Chief Complaint  Patient presents with  . Leg Pain     (Consider location/radiation/quality/duration/timing/severity/associated sxs/prior Treatment) Patient is a 39 y.o. female presenting with leg pain.  Leg Pain Location:  Leg Time since incident:  2 months (worse over last 2 days) Leg location:  L leg, R leg, R lower leg and L lower leg Pain details:    Quality:  Aching and burning   Radiates to:  Does not radiate   Severity:  Severe   Onset quality:  Gradual   Timing:  Constant   Progression:  Worsening Chronicity:  New Prior injury to area:  No Ineffective treatments: vicodin, tramadol, flexeril, gabapentin no relief. Associated symptoms: no back pain, no fever, no muscle weakness, no neck pain, no numbness and no stiffness     Past Medical History  Diagnosis Date  . Morbid obesity   . Alveolar hypoventilation   . Asthma   . GERD (gastroesophageal reflux disease)   . Rectal fissure   . Hypertension   . Costochondritis   . Cellulitis 08/2010-08/2011  . Depression   . Bipolar 2 disorder   . Obstructive sleep apnea   . Chest pain     NO CAD by cath 8/50/27, nl LV systolic fxn  . Diabetes mellitus 2000    Type 2, Uncontrolled  . Arthritis   . HLD (hyperlipidemia)   . Anemia   . COPD (chronic obstructive pulmonary disease)   . Varicose veins     Right medial thigh and Left leg    Past Surgical History  Procedure Laterality Date  . Carpal tunnel release    . Bil knee surgery    . Mass excision N/A 06/29/2013    Procedure:  WIDE LOCAL EXCISION OF POSTERIOR NECK ABSCESS;  Surgeon: Ralene Ok, MD;  Location: [place] [facility];  Service: General;  Laterality: N/A;  . Cardiac catheterization    . Left heart catheterization with coronary angiogram N/A 07/27/2012    Procedure: LEFT HEART CATHETERIZATION WITH CORONARY ANGIOGRAM;  Surgeon: Sherren Mocha,  MD;  Location: [facility] CATH LAB;  Service: Cardiovascular;  Laterality: N/A;   Family History  Problem Relation Age of Onset  . Diabetes Mother   . Hyperlipidemia Mother   . Depression Mother   . Varicose Veins Mother   . Heart attack Paternal Uncle   . Heart disease Paternal Grandmother   . Heart attack Paternal Grandmother   . Heart attack Paternal Grandfather   . Heart disease Paternal Grandfather   . Heart attack Father    Social History  Substance Use Topics  . Smoking status: Former Smoker -- 0.30 packs/day for .3 years    Types: Cigarettes    Quit date: 12/06/1993  . Smokeless tobacco: Never Used  . Alcohol Use: No   OB History    No data available     Review of Systems  Constitutional: Negative for fever.  HENT: Negative for sore throat.   Eyes: Negative for visual disturbance.  Respiratory: Negative for cough and shortness of breath.   Cardiovascular: Negative for chest pain.  Gastrointestinal: Negative for nausea, vomiting and abdominal pain.  Genitourinary: Negative for difficulty urinating.  Musculoskeletal: Negative for back pain, stiffness and neck pain.  Skin: Negative for rash.  Neurological: Negative for syncope and headaches.      Allergies  Kiwi extract and  Nubain  Home Medications   Prior to Admission medications   Medication Sig Start Date End Date Taking? Authorizing Provider  atorvastatin (LIPITOR) 20 MG tablet Take 20 mg by mouth daily.   Yes Historical Provider, MD  carvedilol (COREG) 12.5 MG tablet TAKE 1 TABLET BY MOUTH TWICE DAILY WITH A MEAL 02/05/15  Yes [name], MD  cetirizine (ZYRTEC) 10 MG tablet TAKE 1 TABLET BY MOUTH DAILY 07/16/15  Yes [name], MD  clotrimazole (LOTRIMIN) 1 % cream APPLY TOPICALLY 2 TIMES A DAY AS NEEDED FOR SKIN YEAST 07/16/15  Yes [name], MD  cyclobenzaprine (FLEXERIL) 10 MG tablet TAKE 1 TABLET BY MOUTH TWICE DAILY AS NEEDED FOR MUSCLE SPASMS 03/06/15  Yes [name], MD   fluticasone [facility]) 50 MCG/ACT nasal spray Place 2 sprays into both nostrils daily. 05/21/14  Yes Melony Overly, MD  gabapentin (NEURONTIN) 300 MG capsule TAKE 3 CAPSULES BY MOUTH EVERY MORNING AND AFTERNOON AND TAKE 4 CAPSULES BY MOUTH EVERY EVENING. 07/16/15  Yes [name], MD  HYDROcodone-acetaminophen [facility]) 10-325 MG per tablet Take 1 tablet by mouth every 8 (eight) hours as needed. 04/28/15  Yes [name], MD  hydrocortisone cream 0.5 % Apply 1 application topically 2 (two) times daily as needed for itching. To feet 02/26/15  Yes [name], MD  ibuprofen (ADVIL,MOTRIN) 800 MG tablet Take 1 tablet (800 mg total) by mouth 3 (three) times daily with meals. 12/29/14  Yes [name], PA-C  Insulin Glargine (LANTUS SOLOSTAR) 100 UNIT/ML Solostar Pen Inject 500 Units into the skin every morning. Inject 150 units twice daily. Patient taking differently: Inject 500 Units into the skin every morning.  01/27/15  Yes [name], MD  lisinopril (PRINIVIL,ZESTRIL) 10 MG tablet Take 1 tablet (10 mg total) by mouth daily. 03/12/14  Yes [name], MD  nitroGLYCERIN (NITROSTAT) 0.4 MG SL tablet DISSOLVE 1 TABLET UNDER THE TONGUE EVERY 5 MINUTES AS NEEDED FOR CHEST PAIN. 04/03/15  Yes [name], MD  pantoprazole (PROTONIX) 40 MG tablet TAKE 1 TABLET BY MOUTH EVERY DAY 06/10/15  Yes Alveda Reasons, MD  PROCTOSOL HC 2.5 % rectal cream APPLY TO RECTAL AREA TWICE DAILY 06/10/15  Yes Alveda Reasons, MD  PROVENTIL HFA 108 (90 BASE) MCG/ACT inhaler INHALE 1 PUFF BY MOUTH EVERY 6 HOURS AS NEEDED FOR WHEEZING OR SHORTNESS OF BREATH. 07/16/15  Yes [name], MD  SYMBICORT 160-4.5 MCG/ACT inhaler INHALE 2 PUFFS INTO LUNGS TWICE A DAY 03/06/15  Yes [name], MD  topiramate (TOPAMAX) 25 MG tablet TAKE 1 TABLET BY MOUTH EVERY NIGHT AT BEDTIME 07/16/15  Yes [name], MD  traMADol (ULTRAM) 50 MG tablet Take 1 tablet by mouth every 6 hours as needed for  severe pain in leg 06/24/15  Yes [name], MD  VOLTAREN 1 % GEL APPLY 2 GRAMS TOPICALLY TO AFFECTED AREA TWICE DAILY 06/10/15  Yes Alveda Reasons, MD  ACCU-CHEK FASTCLIX LANCETS MISC Use to check blood sugar 2 times per day. 10/07/14   [name], MD  ACCU-CHEK SMARTVIEW test strip USE TO CHECK BLOOD SUGAR 4 TIMES A DAY 06/10/15   Alveda Reasons, MD  EASY TOUCH PEN NEEDLES 31G X 8 MM MISC USE TO INJECT INSULIN, 7 TIMES A DAY, OR AS DIRECTED. 06/10/15   Alveda Reasons, MD  pantoprazole (PROTONIX) 40 MG tablet TAKE 1 TABLET BY MOUTH EVERY DAY Patient not taking: Reported on 08/02/2015  05/08/15   [name], MD   BP 118/87 mmHg  Pulse 100  Temp(Src) 98.3 F (36.8 C) (Oral)  Resp 18  Ht 5\' 2"  (1.575 m)  Wt 305 lb (138.347 kg)  BMI 55.77 kg/m2  SpO2 98%  LMP 07/19/2015 (Exact Date) Physical Exam  Constitutional: She is oriented to person, place, and time. She appears well-developed and well-nourished. No distress.  HENT:  Head: Normocephalic and atraumatic.  Eyes: Conjunctivae and EOM are normal.  Neck: Normal range of motion.  Cardiovascular: Normal rate, regular rhythm, normal heart sounds and intact distal pulses.  Exam reveals no gallop and no friction rub.   No murmur heard. Pulmonary/Chest: Effort normal and breath sounds normal. No respiratory distress. She has no wheezes. She has no rales.  Abdominal: Soft. She exhibits no distension. There is no tenderness. There is no guarding.  Musculoskeletal: She exhibits no edema or tenderness.  2+DP pulses Feet warm No edema Symmetric LE No erythema  Neurological: She is alert and oriented to person, place, and time.  Skin: Skin is warm and dry. No rash noted. She is not diaphoretic. No erythema.  Nursing note and vitals reviewed.   ED Course  Procedures (including critical care time) Labs Review Labs Reviewed - No data to display  Imaging Review No results found. I have personally reviewed and evaluated these  images and lab results as part of my medical decision-making.   EKG Interpretation None      MDM   Final diagnoses:  None   39 year old female with a history of diabetes, obesity, obesity hypoventilation syndrome, hypertension, asthma, hyperlipidemia, diabetic neuropathy, varicose veins presents with concern of 2 months of bilateral leg pain.   Patient denies any trauma, and have low suspicion for fracture or ligamentous injuries. No sinusitis. No sign of asymmetric swelling and given pain bilaterally at low suspicion for DVT. Patient has had a prior DVT workup for right leg pain which was negative. Patient with warm lower extremities, normal pulses and have low suspicion for acute arterial thrombosis.  On my history patient describes pain worse with rest indicating possible venous insufficiency, and given her description of bilateral burning pain pain may also represent diabetic neuropathy.  Increased patient's gabapentin dose from 300 3 times a day and 1200 at night to 1200 mg 3 times a day.  Gave her a Percocet in the emergency department.  Discussed process with patient. Patient discharged in stable condition with understanding of reasons to return.    Gareth Morgan, MD 08/02/15 2000

## 2015-08-05 ENCOUNTER — Ambulatory Visit (INDEPENDENT_AMBULATORY_CARE_PROVIDER_SITE_OTHER): Payer: Medicaid Other | Admitting: Endocrinology

## 2015-08-05 ENCOUNTER — Ambulatory Visit (INDEPENDENT_AMBULATORY_CARE_PROVIDER_SITE_OTHER): Payer: Medicaid Other | Admitting: Family Medicine

## 2015-08-05 ENCOUNTER — Encounter: Payer: Self-pay | Admitting: Family Medicine

## 2015-08-05 ENCOUNTER — Encounter: Payer: Self-pay | Admitting: Endocrinology

## 2015-08-05 VITALS — BP 129/75 | HR 107 | Temp 98.1°F | Ht 62.0 in | Wt 312.0 lb

## 2015-08-05 VITALS — BP 126/78 | HR 102 | Temp 98.6°F | Ht 62.0 in | Wt 314.0 lb

## 2015-08-05 DIAGNOSIS — E1165 Type 2 diabetes mellitus with hyperglycemia: Secondary | ICD-10-CM

## 2015-08-05 DIAGNOSIS — Z7189 Other specified counseling: Secondary | ICD-10-CM | POA: Diagnosis present

## 2015-08-05 DIAGNOSIS — I10 Essential (primary) hypertension: Secondary | ICD-10-CM

## 2015-08-05 DIAGNOSIS — L089 Local infection of the skin and subcutaneous tissue, unspecified: Secondary | ICD-10-CM | POA: Diagnosis not present

## 2015-08-05 DIAGNOSIS — IMO0002 Reserved for concepts with insufficient information to code with codable children: Secondary | ICD-10-CM

## 2015-08-05 DIAGNOSIS — G2581 Restless legs syndrome: Secondary | ICD-10-CM

## 2015-08-05 DIAGNOSIS — R51 Headache: Secondary | ICD-10-CM | POA: Diagnosis not present

## 2015-08-05 DIAGNOSIS — G629 Polyneuropathy, unspecified: Secondary | ICD-10-CM

## 2015-08-05 DIAGNOSIS — G8929 Other chronic pain: Secondary | ICD-10-CM

## 2015-08-05 LAB — COMPREHENSIVE METABOLIC PANEL
ALT: 14 U/L (ref 6–29)
AST: 10 U/L (ref 10–30)
Albumin: 3.4 g/dL — ABNORMAL LOW (ref 3.6–5.1)
Alkaline Phosphatase: 120 U/L — ABNORMAL HIGH (ref 33–115)
BUN: 9 mg/dL (ref 7–25)
CO2: 26 mmol/L (ref 20–31)
Calcium: 9.1 mg/dL (ref 8.6–10.2)
Chloride: 99 mmol/L (ref 98–110)
Creat: 0.51 mg/dL (ref 0.50–1.10)
Glucose, Bld: 385 mg/dL — ABNORMAL HIGH (ref 65–99)
Potassium: 4.1 mmol/L (ref 3.5–5.3)
Sodium: 136 mmol/L (ref 135–146)
Total Bilirubin: 0.4 mg/dL (ref 0.2–1.2)
Total Protein: 6.9 g/dL (ref 6.1–8.1)

## 2015-08-05 LAB — FERRITIN: Ferritin: 340 ng/mL — ABNORMAL HIGH (ref 10–291)

## 2015-08-05 LAB — POCT GLYCOSYLATED HEMOGLOBIN (HGB A1C): Hemoglobin A1C: 12.9

## 2015-08-05 LAB — TSH: TSH: 1.895 u[IU]/mL (ref 0.350–4.500)

## 2015-08-05 LAB — FOLATE: Folate: 9.1 ng/mL

## 2015-08-05 LAB — IRON AND TIBC
%SAT: 32 % (ref 11–50)
Iron: 115 ug/dL (ref 40–190)
TIBC: 359 ug/dL (ref 250–450)
UIBC: 244 ug/dL (ref 125–400)

## 2015-08-05 LAB — VITAMIN B12: Vitamin B-12: 676 pg/mL (ref 211–911)

## 2015-08-05 MED ORDER — HYDROCODONE-ACETAMINOPHEN 10-325 MG PO TABS: 1.0000 | ORAL_TABLET | Freq: Four times a day (QID) | ORAL | Status: DC | PRN

## 2015-08-05 MED ORDER — CYCLOBENZAPRINE HCL 10 MG PO TABS: ORAL_TABLET | ORAL | Status: DC

## 2015-08-05 MED ORDER — INSULIN GLARGINE 100 UNIT/ML SOLOSTAR PEN: 600.0000 [IU] | PEN_INJECTOR | Freq: Every morning | SUBCUTANEOUS | Status: DC

## 2015-08-05 NOTE — Progress Notes (Signed)
Patient ID: [name], female   DOB: [birth year], 39 y.o.   MRN: [id number]  HPI:  Patient presents for follow-up of leg pain. Was seen by vascular surgery and was told this was not a varicose vein. Has upcoming ABI scheduled. Continues to have pain in her legs, right worse than left. Present during the day but especially at night. Reports her legs jerk throughout the night. Back in May I saw her and asked her to return for lab work but this was never done. Has been taking 4 pills per day of her Norco to cope with the pain. Also having pain with walking. Has been out of Norco since Friday. Has begun to note a slight red rash on her feet bilaterally on dorsal surface. Denies having fever, saddle anesthesia, lower extremity weakness, or problems with stooling or urination.   Hypertension: Doing well on current medicines. Denies any chest pain or shortness of breath.  ROS: See HPI.  [place]: History of asthma, depression, type 2 diabetes, chronic pain, GERD, hyperlipidemia, morbid obesity, OSA on C Pap  PHYSICAL EXAM: BP 129/75 mmHg  Pulse 107  Temp(Src) 98.1 F (36.7 C) (Oral)  Ht 5\' 2"  (1.575 m)  Wt 312 lb (141.522 kg)  BMI 57.05 kg/m2  LMP 07/19/2015 (Exact Date) Gen: No acute distress, pleasant, cooperative HEENT: Normocephalic, atraumatic Heart: Mildly tachycardic, regular rhythm, no murmur Lungs: Clear to auscultation bilaterally, normal respiratory effort Neuro: Grossly nonfocal, speech normal Ext: Difficult to assess for edema given morbid obesity. Legs nontender to palpation. DP Pulses present but diminished bilaterally. No generalized erythema. Small red dots present on dorsal surface of feet along area where flip flops rub.  ASSESSMENT/PLAN:  Restless leg syndrome  suspect many of her leg pain symptoms are related to restless leg syndrome. Never had labs drawn last time to evaluate. we will check all of these labs today. Also increasing dose of Norco temporarily to help with pain.  She'll follow-up in one month for repeat evaluation. Continue to follow with vascular surgery for ABIs, etc.   Essential hypertension Well controlled. Continue current regimen.   Encounter for chronic pain management Increased pain in legs recently, undergoing vascular workup. Encouraged use of supportive shoes. Will increase the quantity of Norco to 4 pills per day. This will only happen for one month, patient understands that very clearly. Also refill flexeril. No tramadol now while on increased quantity of norco. She will follow-up in one month for repeat assessment.   FOLLOW UP: F/u in 1 month for chronic pain  [name], [place]

## 2015-08-05 NOTE — Assessment & Plan Note (Signed)
Increased pain in legs recently, undergoing vascular workup. Encouraged use of supportive shoes. Will increase the quantity of Norco to 4 pills per day. This will only happen for one month, patient understands that very clearly. Also refill flexeril. No tramadol now while on increased quantity of norco. She will follow-up in one month for repeat assessment.

## 2015-08-05 NOTE — Progress Notes (Signed)
Subjective:    Patient ID: [name], female    DOB: 25-Aug-1976, 39 y.o.   MRN: [id number]  HPI Pt returns for f/u of diabetes mellitus: DM type: Insulin-requiring type 2 Dx'ed: 7672 Complications: polyneuropathy. Therapy: insulin since 2007 pregnancy. DKA: never Severe hypoglycemia: never Pancreatitis: never.   Other: she takes QD insulin, after poor results with multiple daily injections; she has severe insulin resistance; she can't have weight-loss surgery, due to having medicaid.  Interval history: She take lantus, 500 units qam.  Pt says she never misses it.  no cbg record, but states cbg's are still "high."  pt states she feels well in general. Past Medical History  Diagnosis Date  . Morbid obesity   . Alveolar hypoventilation   . Asthma   . GERD (gastroesophageal reflux disease)   . Rectal fissure   . Hypertension   . Costochondritis   . Cellulitis 08/2010-08/2011  . Depression   . Bipolar 2 disorder   . Obstructive sleep apnea   . Chest pain     NO CAD by cath 0/94/70, nl LV systolic fxn  . Diabetes mellitus 2000    Type 2, Uncontrolled  . Arthritis   . HLD (hyperlipidemia)   . Anemia   . COPD (chronic obstructive pulmonary disease)   . Varicose veins     Right medial thigh and Left leg     Past Surgical History  Procedure Laterality Date  . Carpal tunnel release    . Bil knee surgery    . Mass excision N/A 06/29/2013    Procedure:  WIDE LOCAL EXCISION OF POSTERIOR NECK ABSCESS;  Surgeon: Ralene Ok, MD;  Location: [place];  Service: General;  Laterality: N/A;  . Cardiac catheterization    . Left heart catheterization with coronary angiogram N/A 07/27/2012    Procedure: LEFT HEART CATHETERIZATION WITH CORONARY ANGIOGRAM;  Surgeon: Sherren Mocha, MD;  Location: [facility] CATH LAB;  Service: Cardiovascular;  Laterality: N/A;    Social History   Social History  . Marital Status: Married    Spouse Name: N/A  . Number of Children: N/A  . Years of Education: N/A    Occupational History  . Not on file.   Social History Main Topics  . Smoking status: Former Smoker -- 0.30 packs/day for .3 years    Types: Cigarettes    Quit date: 12/06/1993  . Smokeless tobacco: Never Used  . Alcohol Use: No  . Drug Use: No  . Sexual Activity:    Partners: Male   Other Topics Concern  . Not on file   Social History Narrative   Lives in [place] with her fiance and 37 yr old dtr.    Current Outpatient Prescriptions on File Prior to Visit  Medication Sig Dispense Refill  . ACCU-CHEK FASTCLIX LANCETS MISC Use to check blood sugar 2 times per day. 100 each 2  . ACCU-CHEK SMARTVIEW test strip USE TO CHECK BLOOD SUGAR 4 TIMES A DAY 100 each 11  . atorvastatin (LIPITOR) 20 MG tablet Take 20 mg by mouth daily.    . carvedilol (COREG) 12.5 MG tablet TAKE 1 TABLET BY MOUTH TWICE DAILY WITH A MEAL 60 tablet 4  . cetirizine (ZYRTEC) 10 MG tablet TAKE 1 TABLET BY MOUTH DAILY 30 tablet 11  . clotrimazole (LOTRIMIN) 1 % cream APPLY TOPICALLY 2 TIMES A DAY AS NEEDED FOR SKIN YEAST 60 g 2  . cyclobenzaprine (FLEXERIL) 10 MG tablet TAKE 1 TABLET BY MOUTH TWICE DAILY AS NEEDED  FOR MUSCLE SPASMS 30 tablet 3  . EASY TOUCH PEN NEEDLES 31G X 8 MM MISC USE TO INJECT INSULIN, 7 TIMES A DAY, OR AS DIRECTED. 200 each 10  . fluticasone (FLONASE) 50 MCG/ACT nasal spray Place 2 sprays into both nostrils daily. 16 g 6  . gabapentin (NEURONTIN) 300 MG capsule Take 4 capsules (1,200 mg total) by mouth 3 (three) times daily. 300 capsule 2  . HYDROcodone-acetaminophen (NORCO) 10-325 MG per tablet Take 1 tablet by mouth every 6 (six) hours as needed. 120 tablet 0  . hydrocortisone cream 0.5 % Apply 1 application topically 2 (two) times daily as needed for itching. To feet 30 g 0  . ibuprofen (ADVIL,MOTRIN) 800 MG tablet Take 1 tablet (800 mg total) by mouth 3 (three) times daily with meals. 21 tablet 0  . lisinopril (PRINIVIL,ZESTRIL) 10 MG tablet Take 1 tablet (10 mg total) by mouth daily. 30  tablet 5  . nitroGLYCERIN (NITROSTAT) 0.4 MG SL tablet DISSOLVE 1 TABLET UNDER THE TONGUE EVERY 5 MINUTES AS NEEDED FOR CHEST PAIN. 30 tablet 1  . pantoprazole (PROTONIX) 40 MG tablet TAKE 1 TABLET BY MOUTH EVERY DAY (Patient not taking: Reported on 08/02/2015) 90 tablet 2  . pantoprazole (PROTONIX) 40 MG tablet TAKE 1 TABLET BY MOUTH EVERY DAY 90 tablet 0  . PROCTOSOL HC 2.5 % rectal cream APPLY TO RECTAL AREA TWICE DAILY 28.35 g 0  . PROVENTIL HFA 108 (90 BASE) MCG/ACT inhaler INHALE 1 PUFF BY MOUTH EVERY 6 HOURS AS NEEDED FOR WHEEZING OR SHORTNESS OF BREATH. 6.7 g 11  . SYMBICORT 160-4.5 MCG/ACT inhaler INHALE 2 PUFFS INTO LUNGS TWICE A DAY 10.2 g 3  . topiramate (TOPAMAX) 25 MG tablet TAKE 1 TABLET BY MOUTH EVERY NIGHT AT BEDTIME 30 tablet 1  . traMADol (ULTRAM) 50 MG tablet Take 1 tablet by mouth every 6 hours as needed for severe pain in leg 60 tablet 0  . VOLTAREN 1 % GEL APPLY 2 GRAMS TOPICALLY TO AFFECTED AREA TWICE DAILY 100 g 0   No current facility-administered medications on file prior to visit.    Allergies  Allergen Reactions  . Kiwi Extract Anaphylaxis  . Nubain [Nalbuphine Hcl] Other (See Comments)    Reaction:  Nervousness    Family History  Problem Relation Age of Onset  . Diabetes Mother   . Hyperlipidemia Mother   . Depression Mother   . Varicose Veins Mother   . Heart attack Paternal Uncle   . Heart disease Paternal Grandmother   . Heart attack Paternal Grandmother   . Heart attack Paternal Grandfather   . Heart disease Paternal Grandfather   . Heart attack Father     BP 126/78 mmHg  Pulse 102  Temp(Src) 98.6 F (37 C) (Oral)  Ht 5\' 2"  (1.575 m)  Wt 314 lb (142.429 kg)  BMI 57.42 kg/m2  SpO2 96%  LMP 07/19/2015 (Exact Date)  Review of Systems She denies hypoglycemia    Objective:   Physical Exam VITAL SIGNS:  See vs page GENERAL: no distress.  Morbid oesity Pulses: dorsalis pedis intact bilat.   MSK: no deformity of the feet CV: no leg  edema Skin:  no ulcer on the feet.  normal color and temp on the feet. Neuro: sensation is intact to touch on the feet   A1c=12.9%    Assessment & Plan:  DM: glycemic control is worse Obesity: severe, persistent  Patient is advised the following: Patient Instructions  Please increase the lantus to  600 units each morning.  i have sent a prescription to your pharmacy. check your blood sugar twice a day.  vary the time of day when you check, between before the 3 meals, and at bedtime.  also check if you have symptoms of your blood sugar being too high or too low.  please keep a record of the readings and bring it to your next appointment here.  You can write it on any piece of paper.  please call us sooner if your blood sugar goes below 70, or if you have a lot of readings over 200.  good diet and exercise significantly improve the control of your diabetes.  please let me know if you wish to see a dietician specialist.   Please come back for a follow-up appointment in 2 months.

## 2015-08-05 NOTE — Patient Instructions (Addendum)
Please increase the lantus to 600 units each morning.  i have sent a prescription to your pharmacy. check your blood sugar twice a day.  vary the time of day when you check, between before the 3 meals, and at bedtime.  also check if you have symptoms of your blood sugar being too high or too low.  please keep a record of the readings and bring it to your next appointment here.  You can write it on any piece of paper.  please call us sooner if your blood sugar goes below 70, or if you have a lot of readings over 200.  good diet and exercise significantly improve the control of your diabetes.  please let me know if you wish to see a dietician specialist.   Please come back for a follow-up appointment in 2 months.

## 2015-08-05 NOTE — Assessment & Plan Note (Signed)
suspect many of her leg pain symptoms are related to restless leg syndrome. Never had labs drawn last time to evaluate. we will check all of these labs today. Also increasing dose of Norco temporarily to help with pain. She'll follow-up in one month for repeat evaluation. Continue to follow with vascular surgery for ABIs, etc.

## 2015-08-05 NOTE — Patient Instructions (Signed)
Checking labs Refilled norco - up to 4 pills per day for the next month Try topical capsaicin cream Follow up in 1 month  Be well, Dr. [name]

## 2015-08-05 NOTE — Assessment & Plan Note (Signed)
Well-controlled.  Continue current regimen. 

## 2015-08-06 ENCOUNTER — Telehealth: Payer: Self-pay | Admitting: Family Medicine

## 2015-08-06 DIAGNOSIS — E559 Vitamin D deficiency, unspecified: Secondary | ICD-10-CM

## 2015-08-06 LAB — VITAMIN D 25 HYDROXY (VIT D DEFICIENCY, FRACTURES): Vit D, 25-Hydroxy: 8 ng/mL — ABNORMAL LOW (ref 30–100)

## 2015-08-06 MED ORDER — CHOLECALCIFEROL 1.25 MG (50000 UT) PO TABS: 1.0000 | ORAL_TABLET | ORAL | Status: DC

## 2015-08-06 NOTE — Telephone Encounter (Signed)
Called pt to discuss labs - vit D low - recommend weekly repletion x 8 weeks then vit D recheck - as Vit D less than 10, need to do a little further workup. Also check phos, PTH, tissue transglutaminase antibodies. Pt to return for lab visit at her convenience for these. - rest of labs good - no signs of iron deficiency  Pt appreciative of call [name], MD

## 2015-08-12 ENCOUNTER — Other Ambulatory Visit: Payer: Self-pay | Admitting: Family Medicine

## 2015-08-12 NOTE — Telephone Encounter (Signed)
Ok to refill or send to [facility]? 

## 2015-08-13 ENCOUNTER — Other Ambulatory Visit: Payer: Self-pay | Admitting: Family Medicine

## 2015-08-14 ENCOUNTER — Encounter: Payer: Self-pay | Admitting: Vascular Surgery

## 2015-08-14 ENCOUNTER — Other Ambulatory Visit: Payer: Self-pay | Admitting: Family Medicine

## 2015-08-15 ENCOUNTER — Ambulatory Visit: Payer: Medicaid Other | Admitting: Vascular Surgery

## 2015-08-15 ENCOUNTER — Ambulatory Visit (HOSPITAL_COMMUNITY)
Admission: RE | Admit: 2015-08-15 | Discharge: 2015-08-15 | Disposition: A | Discharge status: Home / Self Care | Payer: Medicaid Other | Source: Ambulatory Visit | Attending: Vascular Surgery | Admitting: Vascular Surgery

## 2015-08-15 DIAGNOSIS — I739 Peripheral vascular disease, unspecified: Secondary | ICD-10-CM | POA: Insufficient documentation

## 2015-08-15 DIAGNOSIS — Z87891 Personal history of nicotine dependence: Secondary | ICD-10-CM | POA: Diagnosis not present

## 2015-08-15 DIAGNOSIS — E119 Type 2 diabetes mellitus without complications: Secondary | ICD-10-CM | POA: Insufficient documentation

## 2015-08-15 DIAGNOSIS — I10 Essential (primary) hypertension: Secondary | ICD-10-CM | POA: Diagnosis not present

## 2015-08-15 DIAGNOSIS — I83893 Varicose veins of bilateral lower extremities with other complications: Secondary | ICD-10-CM | POA: Diagnosis not present

## 2015-08-15 DIAGNOSIS — E785 Hyperlipidemia, unspecified: Secondary | ICD-10-CM | POA: Diagnosis not present

## 2015-08-20 ENCOUNTER — Encounter: Payer: Self-pay | Admitting: Vascular Surgery

## 2015-08-22 ENCOUNTER — Inpatient Hospital Stay (HOSPITAL_COMMUNITY)
Admission: AD | Admit: 2015-08-22 | Discharge: 2015-08-22 | Disposition: A | Discharge status: Home / Self Care | Payer: Medicaid Other | Source: Ambulatory Visit | Attending: Family Medicine | Admitting: Family Medicine

## 2015-08-22 ENCOUNTER — Encounter (HOSPITAL_COMMUNITY): Payer: Self-pay | Admitting: *Deleted

## 2015-08-22 ENCOUNTER — Ambulatory Visit: Payer: Medicaid Other | Admitting: Vascular Surgery

## 2015-08-22 DIAGNOSIS — I10 Essential (primary) hypertension: Secondary | ICD-10-CM | POA: Diagnosis not present

## 2015-08-22 DIAGNOSIS — Z794 Long term (current) use of insulin: Secondary | ICD-10-CM | POA: Diagnosis not present

## 2015-08-22 DIAGNOSIS — N898 Other specified noninflammatory disorders of vagina: Secondary | ICD-10-CM | POA: Diagnosis present

## 2015-08-22 DIAGNOSIS — Z87891 Personal history of nicotine dependence: Secondary | ICD-10-CM | POA: Diagnosis not present

## 2015-08-22 DIAGNOSIS — E119 Type 2 diabetes mellitus without complications: Secondary | ICD-10-CM | POA: Diagnosis not present

## 2015-08-22 DIAGNOSIS — B373 Candidiasis of vulva and vagina: Secondary | ICD-10-CM | POA: Insufficient documentation

## 2015-08-22 DIAGNOSIS — B3731 Acute candidiasis of vulva and vagina: Secondary | ICD-10-CM

## 2015-08-22 LAB — URINALYSIS, ROUTINE W REFLEX MICROSCOPIC
Bilirubin Urine: NEGATIVE
Ketones, ur: NEGATIVE mg/dL
LEUKOCYTES UA: NEGATIVE
Nitrite: NEGATIVE
PROTEIN: NEGATIVE mg/dL
Specific Gravity, Urine: 1.01 (ref 1.005–1.030)
UROBILINOGEN UA: 0.2 mg/dL (ref 0.0–1.0)
pH: 5.5 (ref 5.0–8.0)

## 2015-08-22 LAB — WET PREP, GENITAL
CLUE CELLS WET PREP: NONE SEEN
Trich, Wet Prep: NONE SEEN

## 2015-08-22 LAB — URINE MICROSCOPIC-ADD ON

## 2015-08-22 LAB — POCT PREGNANCY, URINE: Preg Test, Ur: NEGATIVE

## 2015-08-22 IMAGING — MR MR HEAD WO/W CM
13 of 17 series · 28 of 48 positions shown · IV contrast (Yes)
Comparison: CTA head and neck 07/12/2014. Head CT without contrast
02/14/2010.

CLINICAL DATA: 38-year-old female with intermittent right eye
vision loss. Orbital pain. Initial encounter.

EXAM:
MRI HEAD AND ORBITS WITHOUT AND WITH CONTRAST
TECHNIQUE: Multiplanar, multiecho pulse sequences of the brain and surrounding
structures were obtained without and with intravenous contrast.
Multiplanar, multiecho pulse sequences of the orbits and surrounding
structures were obtained including fat saturation techniques, before
and after intravenous contrast administration.
CONTRAST:  20mL MULTIHANCE GADOBENATE DIMEGLUMINE 529 MG/ML IV SOLN

[Series 3: DWI · axial · 5.0mm · 1.02mm/px · z∈[-101,+31]mm · 4 of 56 slices shown (1 of 4)]
[im 1/56]
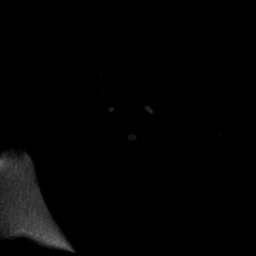
[im 19/56]
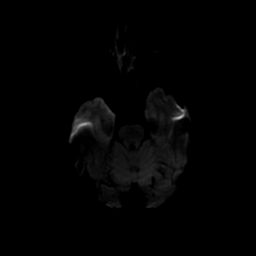
[im 37/56]
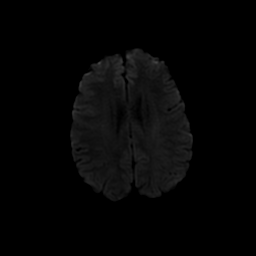
[im 56/56]
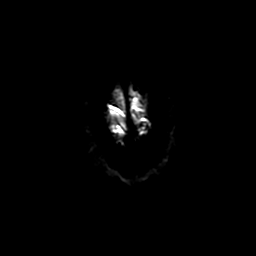

[Series 4: FLAIR · sagittal · 5.0mm · 0.47mm/px · 2 of 21 slices shown (1 of 2)]
[im 1/21]
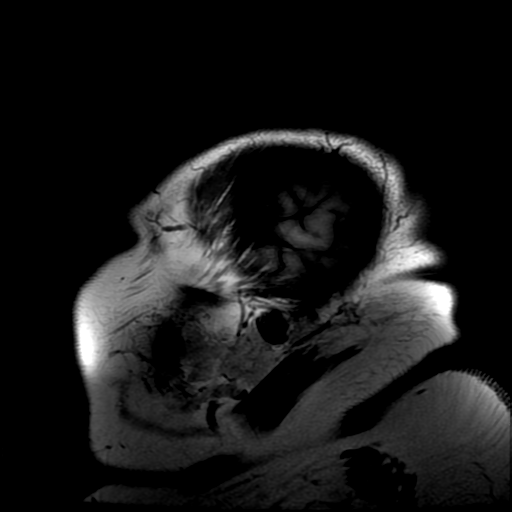
[im 21/21]
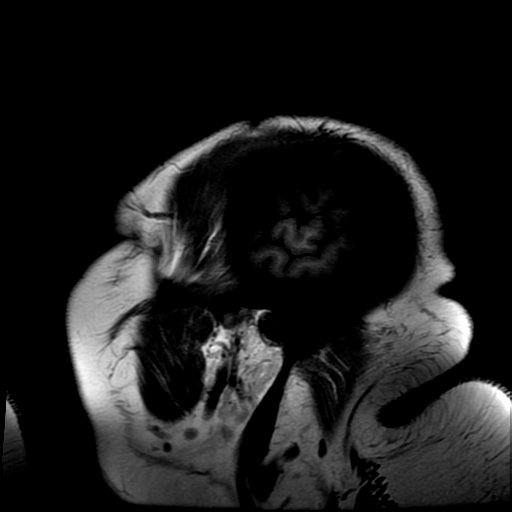

[Series 5: T2 · axial · 5.0mm · 0.43mm/px · z∈[-92,+32]mm · 2 of 22 slices shown (1 of 2)]
[im 1/22]
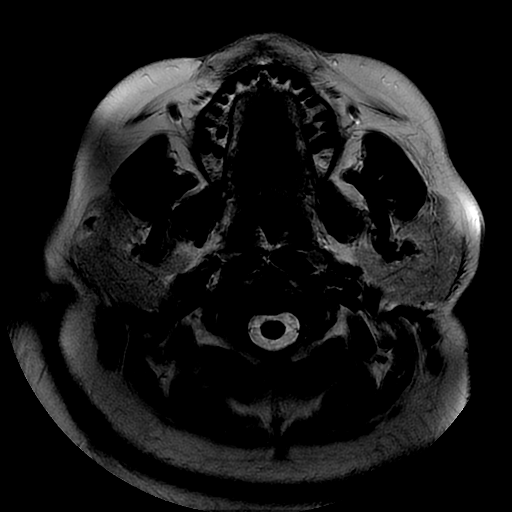
[im 22/22]
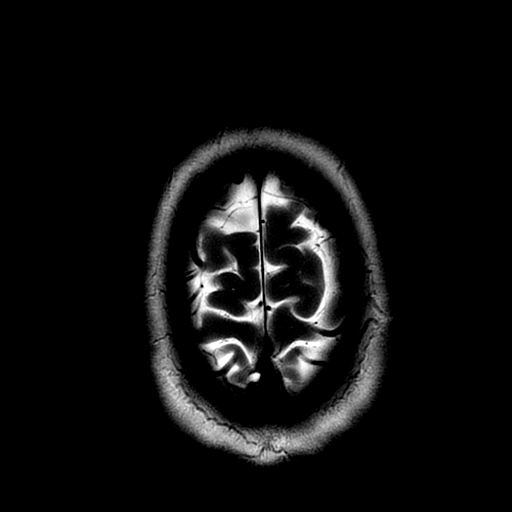

[Series 7: DWI · coronal · 5.0mm · 1.02mm/px · 4 of 59 slices shown (2 of 4)]
[im 1/59]
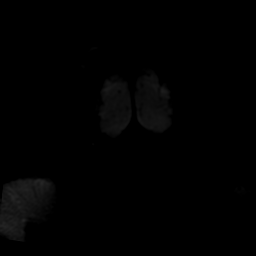
[im 20/59]
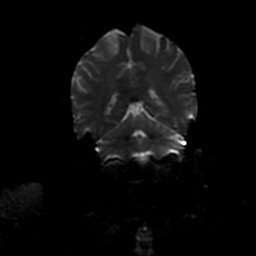
[im 39/59]
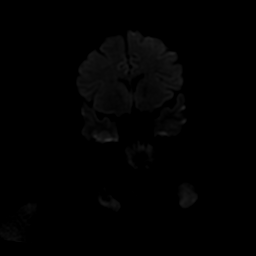
[im 59/59]
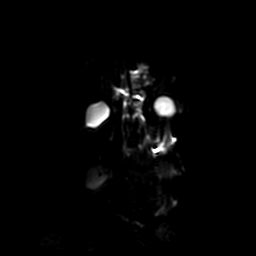

[Series 10: T2 · coronal · 5.0mm · 0.47mm/px · 2 of 27 slices shown (2 of 2)]
[im 1/27]
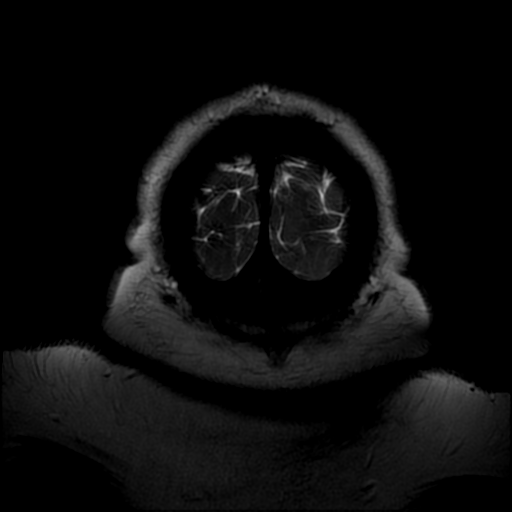
[im 27/27]
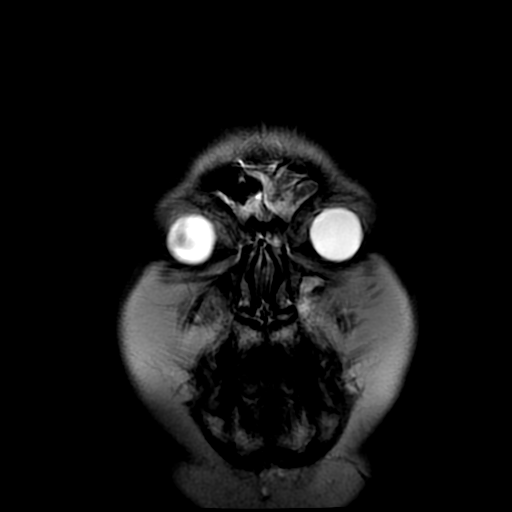

[Series 11: FLAIR · axial · 5.0mm · 0.43mm/px · z∈[-92,+32]mm · 2 of 22 slices shown (2 of 2)]
[im 1/22]
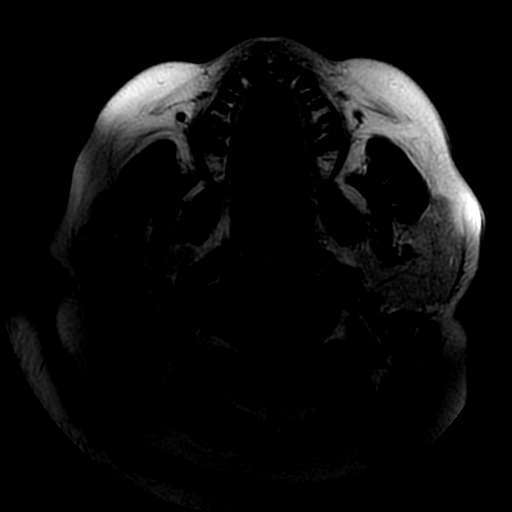
[im 22/22]
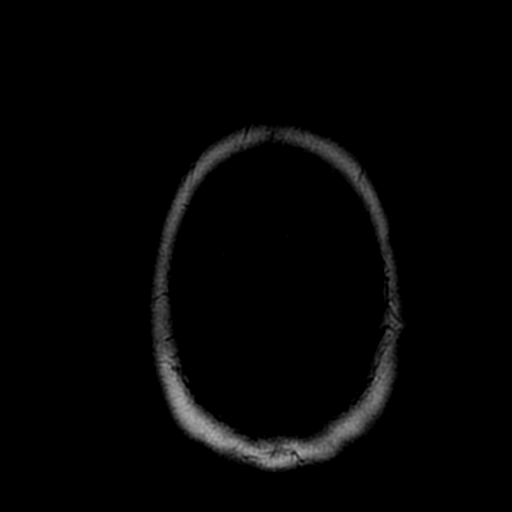

[Series 12: T2 fat-sat · coronal · 3.0mm · 0.35mm/px · 2 of 28 slices shown (1 of 2)]
[im 1/28]
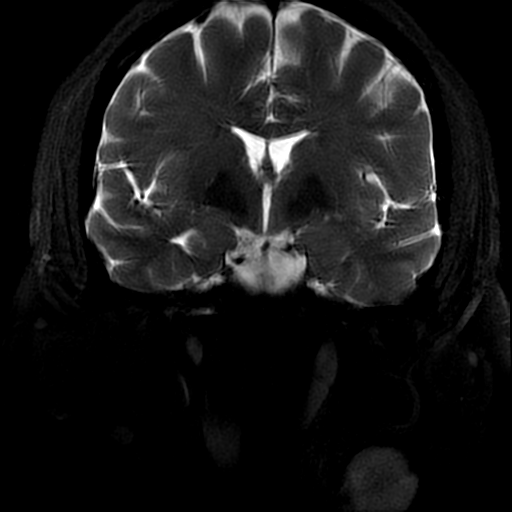
[im 28/28]
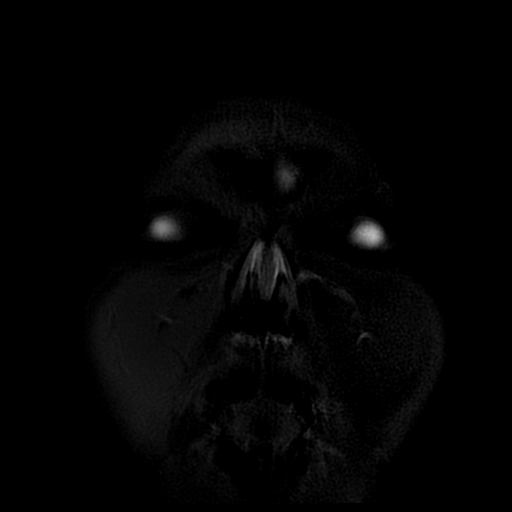

[Series 13: T1 · coronal · 4.0mm · 0.35mm/px · 2 of 28 slices shown (1 of 2)]
[im 1/28]
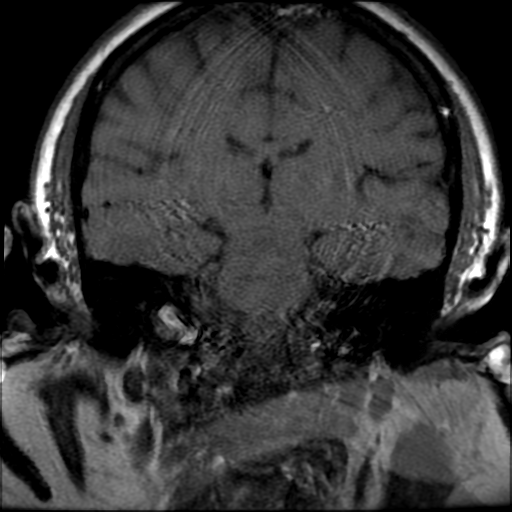
[im 28/28]
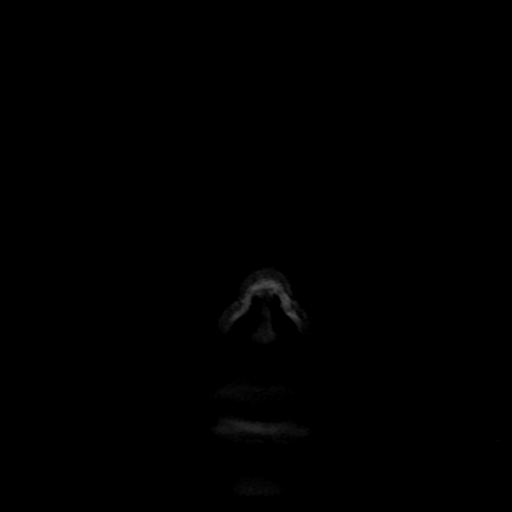

[Series 14: T2 fat-sat · axial · 3.0mm · 0.35mm/px · 1 of 19 slices shown (2 of 2)]
[im 1/19]
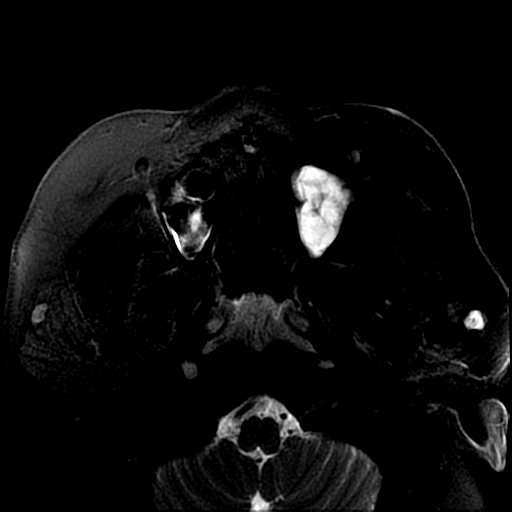

[Series 15: T1 · axial · 3.0mm · 0.35mm/px · 1 of 19 slices shown (2 of 2)]
[im 1/19]
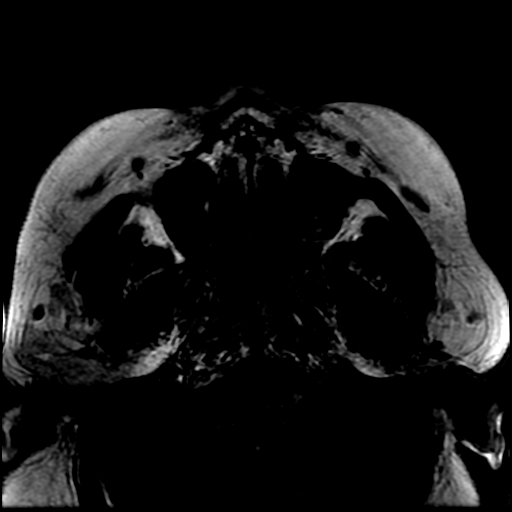

[Series 17: T1 fat-sat · coronal · 3.0mm · 0.70mm/px · 2 of 28 slices shown]
[im 1/28]
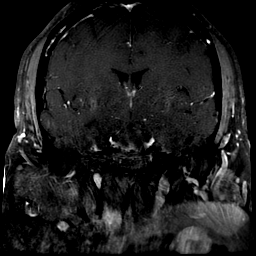
[im 28/28]
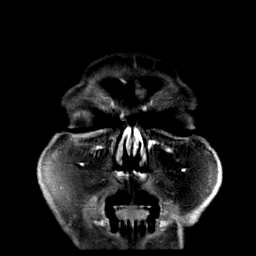

[Series 300: DWI · axial · 5.0mm · 1.02mm/px · z∈[-101,+31]mm · 2 of 28 slices shown (3 of 4)]
[im 1/28]
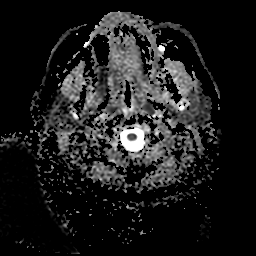
[im 28/28]
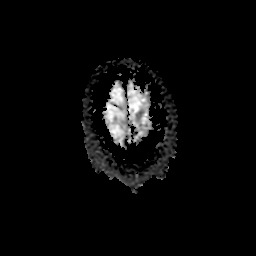

[Series 700: DWI · coronal · 5.0mm · 1.02mm/px · 2 of 30 slices shown (4 of 4)]
[im 1/30]
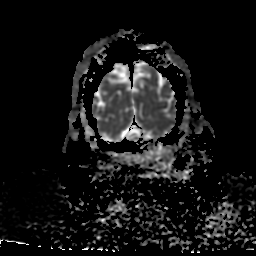
[im 30/30]
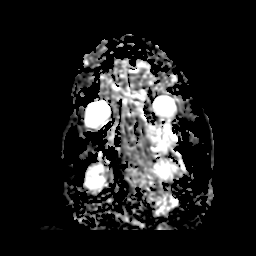

[28 of 48 positions shown; findings below may reference images not displayed]

FINDINGS: MRI HEAD FINDINGS

Large body habitus. Cerebral volume is normal. No restricted
diffusion to suggest acute infarction. No midline shift, mass
effect, evidence of mass lesion, ventriculomegaly, extra-axial
collection or acute intracranial hemorrhage. Cervicomedullary
junction and pituitary are within normal limits. Negative visualized
cervical spine. Major intracranial vascular flow voids are
preserved. Gray and white matter signal is within normal limits
throughout the brain. No abnormal enhancement identified. Visible
internal auditory structures appear normal. Mastoids are clear.
Moderate to severe paranasal sinus mucosal thickening, primarily in
the frontal, ethmoid, and left maxillary sinuses. Superimposed left
maxillary fluid level.

Visualized scalp soft tissues are within normal limits. Visualized
bone marrow signal is within normal limits.

MRI ORBITS FINDINGS

Mild motion artifact. Normal globes. Optic nerves appear symmetric
and within normal limits. No intraorbital inflammation. Extraocular
muscles appear within normal limits. No abnormal intraorbital
enhancement. Normal cavernous sinus. Normal optic chiasm. No
suprasellar mass or mass effect.

Paranasal sinus inflammatory changes again noted. Superficial
periorbital soft tissues are normal.
IMPRESSION: 1.  Normal MRI appearance of the brain.
2. Normal MRI appearance of the orbits.
3. Moderate to severe paranasal sinus inflammation, greater on the
left.

## 2015-08-22 MED ORDER — FLUCONAZOLE 150 MG PO TABS: 150.0000 mg | ORAL_TABLET | Freq: Once | ORAL | Status: DC

## 2015-08-22 NOTE — MAU Note (Addendum)
ON 8-30-  WAS AT  [facility]    FOR  LEG PAIN.   PT  HAS   VAG  ITCHING-  STARTED  2 WEEKS  AGO -   DR MEISINGER  GAVE  HER    A RX  FOR   CREAM  IN MAY  -  NO RELIEF  AND NOW  D/C  HAS AN ODOR AND  BURNING .   SAW DR Willis Modena [phone].   NO BIRTH CONTROL.Marland Kitchen LAST  SEX-  08-19-2015.

## 2015-08-22 NOTE — MAU Provider Note (Signed)
History     CSN: [id number]  Arrival date and time: 08/22/15 0211   First Provider Initiated Contact with Patient 08/22/15 0304      Chief Complaint  Patient presents with  . Vaginal Discharge   HPI Comments: [name] is a 39 y.o. G2P0011 who presents today with vaginal irritation. She states that she has been using a cream for yeast infections, but states that it does not seem to be working. She states that she gets frequent yeast infections due to her blood sugar not being well controlled.   Vaginal Discharge The patient's primary symptoms include genital itching and vaginal discharge. This is a new problem. The current episode started in the past 7 days. The problem occurs constantly. The problem has been unchanged. The patient is experiencing no pain. She is not pregnant. Pertinent negatives include no abdominal pain, constipation, diarrhea, dysuria, fever, frequency, nausea, urgency or vomiting. The vaginal discharge was white. There has been no bleeding. Nothing aggravates the symptoms. She has tried nothing for the symptoms. She uses nothing for contraception.    Past Medical History  Diagnosis Date  . Morbid obesity   . Alveolar hypoventilation   . Asthma   . GERD (gastroesophageal reflux disease)   . Rectal fissure   . Hypertension   . Costochondritis   . Cellulitis 08/2010-08/2011  . Depression   . Bipolar 2 disorder   . Obstructive sleep apnea   . Chest pain     NO CAD by cath 08/05/50, nl LV systolic fxn  . Diabetes mellitus 2000    Type 2, Uncontrolled  . Arthritis   . HLD (hyperlipidemia)   . Anemia   . COPD (chronic obstructive pulmonary disease)   . Varicose veins     Right medial thigh and Left leg     Past Surgical History  Procedure Laterality Date  . Carpal tunnel release    . Bil knee surgery    . Mass excision N/A 06/29/2013    Procedure:  WIDE LOCAL EXCISION OF POSTERIOR NECK ABSCESS;  Surgeon: Ralene Ok, MD;  Location: [place];  Service:  General;  Laterality: N/A;  . Cardiac catheterization    . Left heart catheterization with coronary angiogram N/A 07/27/2012    Procedure: LEFT HEART CATHETERIZATION WITH CORONARY ANGIOGRAM;  Surgeon: Sherren Mocha, MD;  Location: [facility] CATH LAB;  Service: Cardiovascular;  Laterality: N/A;    Family History  Problem Relation Age of Onset  . Diabetes Mother   . Hyperlipidemia Mother   . Depression Mother   . Varicose Veins Mother   . Heart attack Paternal Uncle   . Heart disease Paternal Grandmother   . Heart attack Paternal Grandmother   . Heart attack Paternal Grandfather   . Heart disease Paternal Grandfather   . Heart attack Father     Social History  Substance Use Topics  . Smoking status: Former Smoker -- 0.30 packs/day for .3 years    Types: Cigarettes    Quit date: 12/06/1993  . Smokeless tobacco: Never Used  . Alcohol Use: No    Allergies:  Allergies  Allergen Reactions  . Kiwi Extract Anaphylaxis  . Nubain [Nalbuphine Hcl] Other (See Comments)    Reaction:  Nervousness    Prescriptions prior to admission  Medication Sig Dispense Refill Last Dose  . ACCU-CHEK FASTCLIX LANCETS MISC Use to check blood sugar 2 times per day. 100 each 2 Taking  . ACCU-CHEK SMARTVIEW test strip USE TO CHECK BLOOD SUGAR 4 TIMES  A DAY 100 each 11 Taking  . atorvastatin (LIPITOR) 20 MG tablet Take 20 mg by mouth daily.   08/01/2015 at Unknown time  . carvedilol (COREG) 12.5 MG tablet TAKE 1 TABLET BY MOUTH TWICE DAILY WITH A MEAL 60 tablet 5   . cetirizine (ZYRTEC) 10 MG tablet TAKE 1 TABLET BY MOUTH DAILY 30 tablet 11 08/01/2015 at Unknown time  . Cholecalciferol 50000 UNITS TABS Take 1 tablet by mouth once a week. For 8 weeks 8 tablet 0   . clotrimazole (LOTRIMIN) 1 % cream APPLY TOPICALLY 2 TIMES A DAY AS NEEDED FOR SKIN YEAST 60 g 2 08/01/2015 at Unknown time  . cyclobenzaprine (FLEXERIL) 10 MG tablet TAKE 1 TABLET BY MOUTH TWICE DAILY AS NEEDED FOR MUSCLE SPASMS 30 tablet 3   . EASY  TOUCH PEN NEEDLES 31G X 8 MM MISC USE TO INJECT INSULIN, 7 TIMES A DAY, OR AS DIRECTED. 200 each 10 Taking  . fluticasone (FLONASE) 50 MCG/ACT nasal spray Place 2 sprays into both nostrils daily. 16 g 6 08/01/2015 at Unknown time  . gabapentin (NEURONTIN) 300 MG capsule Take 4 capsules (1,200 mg total) by mouth 3 (three) times daily. 300 capsule 2   . HYDROcodone-acetaminophen (NORCO) 10-325 MG per tablet Take 1 tablet by mouth every 6 (six) hours as needed. 120 tablet 0   . hydrocortisone cream 0.5 % Apply 1 application topically 2 (two) times daily as needed for itching. To feet 30 g 0 08/01/2015 at Unknown time  . ibuprofen (ADVIL,MOTRIN) 800 MG tablet Take 1 tablet (800 mg total) by mouth 3 (three) times daily with meals. 21 tablet 0 08/01/2015 at Unknown time  . Insulin Glargine (LANTUS SOLOSTAR) 100 UNIT/ML Solostar Pen Inject 600 Units into the skin every morning. And pen needles 4/day 70 pen 11   . lisinopril (PRINIVIL,ZESTRIL) 10 MG tablet Take 1 tablet (10 mg total) by mouth daily. 30 tablet 5 08/01/2015 at Unknown time  . nitroGLYCERIN (NITROSTAT) 0.4 MG SL tablet DISSOLVE 1 TABLET UNDER THE TONGUE EVERY 5 MINUTES AS NEEDED FOR CHEST PAIN. 30 tablet 1 unknown  . pantoprazole (PROTONIX) 40 MG tablet TAKE 1 TABLET BY MOUTH EVERY DAY (Patient not taking: Reported on 08/02/2015) 90 tablet 2 Not Taking at Unknown time  . pantoprazole (PROTONIX) 40 MG tablet TAKE 1 TABLET BY MOUTH EVERY DAY 90 tablet 0 08/01/2015 at Unknown time  . PROCTOSOL HC 2.5 % rectal cream APPLY TO RECTAL AREA TWICE DAILY 28.35 g 0 08/01/2015 at Unknown time  . PROVENTIL HFA 108 (90 BASE) MCG/ACT inhaler INHALE 1 PUFF BY MOUTH EVERY 6 HOURS AS NEEDED FOR WHEEZING OR SHORTNESS OF BREATH. 6.7 g 11 08/01/2015 at Unknown time  . SYMBICORT 160-4.5 MCG/ACT inhaler INHALE 2 PUFFS INTO LUNGS TWICE A DAY 10.2 g 5   . topiramate (TOPAMAX) 25 MG tablet TAKE 1 TABLET BY MOUTH EVERY NIGHT AT BEDTIME 30 tablet 1 08/01/2015 at Unknown time  .  traMADol (ULTRAM) 50 MG tablet Take 1 tablet by mouth every 6 hours as needed for severe pain in leg 60 tablet 0 08/01/2015 at Unknown time  . VOLTAREN 1 % GEL APPLY 2 GRAMS TOPICALLY TO AFFECTED AREA TWICE DAILY 100 g 0 08/01/2015 at Unknown time    Review of Systems  Constitutional: Negative for fever.  Gastrointestinal: Negative for nausea, vomiting, abdominal pain, diarrhea and constipation.  Genitourinary: Positive for vaginal discharge. Negative for dysuria, urgency and frequency.   Physical Exam   Blood pressure 120/76, pulse 104,  temperature 97.9 F (36.6 C), temperature source Oral, resp. rate 20, height 5\' 1"  (1.549 m), weight 140.728 kg (310 lb 4 oz), last menstrual period 07/19/2015.  Physical Exam  Nursing note and vitals reviewed. Constitutional: She is oriented to person, place, and time. She appears well-developed. No distress.  HENT:  Head: Normocephalic.  Cardiovascular: Normal rate.   Respiratory: Effort normal.  GI: Soft. There is no tenderness. There is no rebound.  Neurological: She is alert and oriented to person, place, and time.  Skin: Skin is warm and dry.  Psychiatric: She has a normal mood and affect.   Results for orders placed or performed during the hospital encounter of 08/22/15 (from the past 24 hour(s))  Urinalysis, Routine w reflex microscopic (not at [facility])     Status: Abnormal   Collection Time: 08/22/15  2:25 AM  Result Value Ref Range   Color, Urine YELLOW YELLOW   APPearance CLEAR CLEAR   Specific Gravity, Urine 1.010 1.005 - 1.030   pH 5.5 5.0 - 8.0   Glucose, UA >1000 (A) NEGATIVE mg/dL   Hgb urine dipstick TRACE (A) NEGATIVE   Bilirubin Urine NEGATIVE NEGATIVE   Ketones, ur NEGATIVE NEGATIVE mg/dL   Protein, ur NEGATIVE NEGATIVE mg/dL   Urobilinogen, UA 0.2 0.0 - 1.0 mg/dL   Nitrite NEGATIVE NEGATIVE   Leukocytes, UA NEGATIVE NEGATIVE  Urine microscopic-add on     Status: None   Collection Time: 08/22/15  2:25 AM  Result Value Ref  Range   Squamous Epithelial / LPF RARE RARE   RBC / HPF 0-2 <3 RBC/hpf   Bacteria, UA RARE RARE  Pregnancy, urine POC     Status: None   Collection Time: 08/22/15  2:39 AM  Result Value Ref Range   Preg Test, Ur NEGATIVE NEGATIVE  Wet prep, genital     Status: Abnormal   Collection Time: 08/22/15  2:40 AM  Result Value Ref Range   Yeast Wet Prep HPF POC FEW (A) NONE SEEN   Trich, Wet Prep NONE SEEN NONE SEEN   Clue Cells Wet Prep HPF POC NONE SEEN NONE SEEN   WBC, Wet Prep HPF POC FEW (A) NONE SEEN    MAU Course  Procedures  MDM   Assessment and Plan   1. Yeast infection involving the vagina and surrounding area    DC home Comfort measures reviewed  RX: diflcuan #2 Return to MAU as needed   Follow-up Information    Follow up with [facility].   Contact information:   [address] [place] [address]       Mathis Bud 08/22/2015, 3:05 AM

## 2015-08-22 NOTE — Discharge Instructions (Signed)
Candidal Vulvovaginitis °Candidal vulvovaginitis is an infection of the vagina and vulva. The vulva is the skin around the opening of the vagina. This may cause itching and discomfort in and around the vagina.  °HOME CARE °· Only take medicine as told by your doctor. °· Do not have sex (intercourse) until the infection is healed or as told by your doctor. °· Practice safe sex. °· Tell your sex partner about your infection. °· Do not douche or use tampons. °· Wear cotton underwear. Do not wear tight pants or panty hose. °· Eat yogurt. This may help treat and prevent yeast infections. °GET HELP RIGHT AWAY IF:  °· You have a fever. °· Your problems get worse during treatment or do not get better in 3 days. °· You have discomfort, irritation, or itching in your vagina or vulva area. °· You have pain after sex. °· You start to get belly (abdominal) pain. °MAKE SURE YOU: °· Understand these instructions. °· Will watch your condition. °· Will get help right away if you are not doing well or get worse. °Document Released: 02/18/2009 Document Revised: 11/27/2013 Document Reviewed: 02/18/2009 °[employer]® Patient Information ©2015 [employer]. This information is not intended to replace advice given to you by your health care provider. Make sure you discuss any questions you have with your health care provider. ° °

## 2015-08-25 LAB — GC/CHLAMYDIA PROBE AMP (~~LOC~~) NOT AT ARMC
Chlamydia: NEGATIVE
NEISSERIA GONORRHEA: NEGATIVE

## 2015-08-29 ENCOUNTER — Ambulatory Visit: Payer: Medicaid Other | Admitting: Neurology

## 2015-09-01 ENCOUNTER — Telehealth: Payer: Self-pay | Admitting: Family Medicine

## 2015-09-01 ENCOUNTER — Encounter: Payer: Self-pay | Admitting: Family Medicine

## 2015-09-01 ENCOUNTER — Ambulatory Visit (INDEPENDENT_AMBULATORY_CARE_PROVIDER_SITE_OTHER): Payer: Medicaid Other | Admitting: Family Medicine

## 2015-09-01 VITALS — BP 141/90 | HR 101 | Temp 97.8°F | Wt 306.0 lb

## 2015-09-01 DIAGNOSIS — G2581 Restless legs syndrome: Secondary | ICD-10-CM

## 2015-09-01 DIAGNOSIS — R51 Headache: Secondary | ICD-10-CM | POA: Diagnosis not present

## 2015-09-01 DIAGNOSIS — B373 Candidiasis of vulva and vagina: Secondary | ICD-10-CM

## 2015-09-01 DIAGNOSIS — Z7189 Other specified counseling: Secondary | ICD-10-CM | POA: Diagnosis not present

## 2015-09-01 DIAGNOSIS — L089 Local infection of the skin and subcutaneous tissue, unspecified: Secondary | ICD-10-CM | POA: Diagnosis not present

## 2015-09-01 DIAGNOSIS — B3731 Acute candidiasis of vulva and vagina: Secondary | ICD-10-CM

## 2015-09-01 DIAGNOSIS — E559 Vitamin D deficiency, unspecified: Secondary | ICD-10-CM | POA: Diagnosis not present

## 2015-09-01 DIAGNOSIS — G8929 Other chronic pain: Secondary | ICD-10-CM

## 2015-09-01 DIAGNOSIS — I10 Essential (primary) hypertension: Secondary | ICD-10-CM | POA: Diagnosis not present

## 2015-09-01 MED ORDER — HYDROCODONE-ACETAMINOPHEN 10-325 MG PO TABS: 1.0000 | ORAL_TABLET | Freq: Four times a day (QID) | ORAL | Status: DC | PRN

## 2015-09-01 MED ORDER — ROPINIROLE HCL 0.25 MG PO TABS: ORAL_TABLET | ORAL | Status: DC

## 2015-09-01 NOTE — Patient Instructions (Signed)
Take ropinirole for restless leg syndrome Checking labwork today Continue vitamin D Refilled pain medicine Follow up in 1 month  Be well, Dr. [name]

## 2015-09-01 NOTE — Progress Notes (Signed)
  HPI:  Patient presents for follow up of routine medical problems:  Chronic pain - taking norco. Working well. Ok with decreasing morphine pills back to three times daily. Pain improving with vitamin D therapy.   RLS - Does have still lots of restless leg symptoms when laying down at night. Legs hurt and she feels need to move legs. Iron studies were unremarkable previously  Vit D deficiency - has noted pain improved in legs since taking vitamin D weekly  Vaginal yeast infections - having recurrent vaginal candidiasis. Has never been tested for HIV.  Working with endocrinologist to get diabetes under control. Was seen in ED on 9/16 for yeast and given diflucan.  ROS: See HPI.  [place]: morbid obesity, poorly controlled dm  PHYSICAL EXAM: BP 141/90 mmHg  Pulse 101  Temp(Src) 97.8 F (36.6 C) (Oral)  Wt 306 lb (138.801 kg)  LMP 08/25/2015 Gen: NAD, pleasant, cooperative HEENT: NCAT Neuro: grossly nonfocal speech normal Ext: No appreciable lower extremity edema bilaterally. Atraumatic lower extremities.  ASSESSMENT/PLAN:  Encounter for chronic pain management Refill norco #90 pills. Follow up in 1 mo.  Restless leg syndrome Start trial of ropinirole. Instructions given for uptitration. Follow up with me in 1 month.  Vitamin D deficiency Markedly low vit D prompts need for additonal testing: TTG antibodies, phosphorus, PTH & calcium Continue weekly supplementation Follow up in 1 mo  Recurrent candidiasis of vagina Suspect this is related to uncontrolled diabetes.  Check HIV today May need to discuss probiotics etc at next visit.   FOLLOW UP: F/u in 1 mo for above issues  [name], [place]

## 2015-09-01 NOTE — Telephone Encounter (Signed)
Pt called and would like to have refills on all her CPAP supplies sent in. jw

## 2015-09-02 ENCOUNTER — Emergency Department (HOSPITAL_COMMUNITY)
Admission: EM | Admit: 2015-09-02 | Discharge: 2015-09-02 | Disposition: A | Discharge status: Home / Self Care | Payer: Medicaid Other | Attending: Emergency Medicine | Admitting: Emergency Medicine

## 2015-09-02 ENCOUNTER — Encounter (HOSPITAL_COMMUNITY): Payer: Self-pay | Admitting: Emergency Medicine

## 2015-09-02 ENCOUNTER — Telehealth: Payer: Self-pay | Admitting: Family Medicine

## 2015-09-02 DIAGNOSIS — Z794 Long term (current) use of insulin: Secondary | ICD-10-CM | POA: Insufficient documentation

## 2015-09-02 DIAGNOSIS — M1990 Unspecified osteoarthritis, unspecified site: Secondary | ICD-10-CM | POA: Diagnosis not present

## 2015-09-02 DIAGNOSIS — Z79899 Other long term (current) drug therapy: Secondary | ICD-10-CM | POA: Insufficient documentation

## 2015-09-02 DIAGNOSIS — K219 Gastro-esophageal reflux disease without esophagitis: Secondary | ICD-10-CM | POA: Insufficient documentation

## 2015-09-02 DIAGNOSIS — Z8669 Personal history of other diseases of the nervous system and sense organs: Secondary | ICD-10-CM | POA: Insufficient documentation

## 2015-09-02 DIAGNOSIS — R1310 Dysphagia, unspecified: Secondary | ICD-10-CM

## 2015-09-02 DIAGNOSIS — J449 Chronic obstructive pulmonary disease, unspecified: Secondary | ICD-10-CM | POA: Diagnosis not present

## 2015-09-02 DIAGNOSIS — E785 Hyperlipidemia, unspecified: Secondary | ICD-10-CM | POA: Diagnosis not present

## 2015-09-02 DIAGNOSIS — I10 Essential (primary) hypertension: Secondary | ICD-10-CM | POA: Insufficient documentation

## 2015-09-02 DIAGNOSIS — Z87891 Personal history of nicotine dependence: Secondary | ICD-10-CM | POA: Diagnosis not present

## 2015-09-02 DIAGNOSIS — Z9889 Other specified postprocedural states: Secondary | ICD-10-CM | POA: Diagnosis not present

## 2015-09-02 DIAGNOSIS — F3181 Bipolar II disorder: Secondary | ICD-10-CM | POA: Insufficient documentation

## 2015-09-02 DIAGNOSIS — E119 Type 2 diabetes mellitus without complications: Secondary | ICD-10-CM | POA: Diagnosis not present

## 2015-09-02 DIAGNOSIS — R1319 Other dysphagia: Secondary | ICD-10-CM | POA: Diagnosis not present

## 2015-09-02 DIAGNOSIS — Z7951 Long term (current) use of inhaled steroids: Secondary | ICD-10-CM | POA: Insufficient documentation

## 2015-09-02 DIAGNOSIS — Z862 Personal history of diseases of the blood and blood-forming organs and certain disorders involving the immune mechanism: Secondary | ICD-10-CM | POA: Diagnosis not present

## 2015-09-02 DIAGNOSIS — K228 Other specified diseases of esophagus: Secondary | ICD-10-CM | POA: Diagnosis present

## 2015-09-02 LAB — PTH, INTACT AND CALCIUM
Calcium: 8.6 mg/dL (ref 8.4–10.5)
PTH: 28 pg/mL (ref 14–64)

## 2015-09-02 LAB — HIV ANTIBODY (ROUTINE TESTING W REFLEX): HIV: NONREACTIVE

## 2015-09-02 LAB — PHOSPHORUS: PHOSPHORUS: 4.1 mg/dL (ref 2.5–4.5)

## 2015-09-02 LAB — TISSUE TRANSGLUTAMINASE, IGA: Tissue Transglutaminase Ab, IgA: 1 U/mL (ref <4)

## 2015-09-02 MED ORDER — GI COCKTAIL ~~LOC~~
30.0000 mL | Freq: Once | ORAL | Status: AC
  Administered 2015-09-02: 30 mL via ORAL
  Filled 2015-09-02: qty 30

## 2015-09-02 NOTE — Discharge Instructions (Signed)
Dysphagia °Swallowing problems (dysphagia) occur when solids and liquids seem to stick in your throat on the way down to your stomach, or the food takes longer to get to the stomach. Other symptoms include regurgitating food, noises coming from the throat, chest discomfort with swallowing, and a feeling of fullness or the feeling of something being stuck in your throat when swallowing. When blockage in your throat is complete, it may be associated with drooling. °CAUSES  °Problems with swallowing may occur because of problems with the muscles. The food cannot be propelled in the usual manner into your stomach. You may have ulcers, scar tissue, or inflammation in the tube down which food travels from your mouth to your stomach (esophagus), which blocks food from passing normally into the stomach. Causes of inflammation include: °· Acid reflux from your stomach into your esophagus. °· Infection. °· Radiation treatment for cancer. °· Medicines taken without enough fluids to wash them down into your stomach. °You may have nerve problems that prevent signals from being sent to the muscles of your esophagus to contract and move your food down to your stomach. Globus pharyngeus is a relatively common problem in which there is a sense of an obstruction or difficulty in swallowing, without any physical abnormalities of the swallowing passages being found. This problem usually improves over time with reassurance and testing to rule out other causes. °DIAGNOSIS °Dysphagia can be diagnosed and its cause can be determined by tests in which you swallow a white substance that helps illuminate the inside of your throat (contrast medium) while X-rays are taken. Sometimes a flexible telescope that is inserted down your throat (endoscopy) to look at your esophagus and stomach is used. °TREATMENT  °· If the dysphagia is caused by acid reflux or infection, medicines may be used. °· If the dysphagia is caused by problems with your  swallowing muscles, swallowing therapy may be used to help you strengthen your swallowing muscles. °· If the dysphagia is caused by a blockage or mass, procedures to remove the blockage may be done. °HOME CARE INSTRUCTIONS °· Try to eat soft food that is easier to swallow and check your weight on a daily basis to be sure that it is not decreasing. °· Be sure to drink liquids when sitting upright (not lying down). °SEEK MEDICAL CARE IF: °· You are losing weight because you are unable to swallow. °· You are coughing when you drink liquids (aspiration). °· You are coughing up partially digested food. °SEEK IMMEDIATE MEDICAL CARE IF: °· You are unable to swallow your own saliva . °· You are having shortness of breath or a fever, or both. °· You have a hoarse voice along with difficulty swallowing. °MAKE SURE YOU: °· Understand these instructions. °· Will watch your condition. °· Will get help right away if you are not doing well or get worse. °Document Released: 11/19/2000 Document Revised: 04/08/2014 Document Reviewed: 05/11/2013 °ExitCare® Patient Information ©2015 [employer]. This information is not intended to replace advice given to you by your health care provider. Make sure you discuss any questions you have with your health care provider. ° °

## 2015-09-02 NOTE — Telephone Encounter (Signed)
Family Medicine Emergency Line Telephone Note  Patient called to discuss dysphagia/globus pharyngitis. The patient feels like she has something stuck in her throat and it hurts in her chest ONLY when swallows. By chest pain, she means it hurts up in her neck. This began around 12pm today. She doesn't recall coking on anything. It hurts to drink liquid. If she eats something, she has to drink something to help it get down. She denies coughing, watery eyes, rhinorrhea, or other URI symptoms. No fevers. Has a h/o GERD but states she's been taking prevacid.  She denies any new medications recently.  Patient calling at this time as she is trying to go to sleep but cannot as the sensation of something being stuck in her throat is still there. No h/o sensations like this in that past. The patient has multiple risk factors for CAD but the position of her pain does not seem cardiac in nature, it sounds more epigastric. I discuss with the patient it is difficult to diagnose her over the phone (esepcially as she tells me "it hurts here" and has difficulty verbalizing the location). This sounds like globus pharyngeus however I advised the patient it would be best to go to urgent care vs follow up in our SDA when we open. She states she would going to the ED.   Archie Patten, MD [facility] Family Medicine Resident  09/02/2015, 1:49 AM

## 2015-09-02 NOTE — ED Notes (Signed)
Pt. states esophageal discomfort " something is blocking " onset this morning , "hard to swallow" , denies  nausea or vomitting , respirations unlabored .

## 2015-09-02 NOTE — ED Provider Notes (Signed)
CSN: [id number]     Arrival date & time 09/02/15  0413 History   First MD Initiated Contact with Patient 09/02/15 0502     Chief Complaint  Patient presents with  . Gastrophageal Reflux     (Consider location/radiation/quality/duration/timing/severity/associated sxs/prior Treatment) HPI Comments: Patient presents with pain in the mid-esophagus with swallowing. She feels a pill may have become stuck night before last and has caused increasing pain since. She is able to eat and drink without difficulty. No vomiting. She denies sore throat, fever, congestion. She has a history of GERD but reports symptoms are not the same.   The history is provided by the patient. No language interpreter was used.    Past Medical History  Diagnosis Date  . Morbid obesity   . Alveolar hypoventilation   . Asthma   . GERD (gastroesophageal reflux disease)   . Rectal fissure   . Hypertension   . Costochondritis   . Cellulitis 08/2010-08/2011  . Depression   . Bipolar 2 disorder   . Obstructive sleep apnea   . Chest pain     NO CAD by cath 6/56/81, nl LV systolic fxn  . Diabetes mellitus 2000    Type 2, Uncontrolled  . Arthritis   . HLD (hyperlipidemia)   . Anemia   . COPD (chronic obstructive pulmonary disease)   . Varicose veins     Right medial thigh and Left leg    Past Surgical History  Procedure Laterality Date  . Carpal tunnel release    . Bil knee surgery    . Mass excision N/A 06/29/2013    Procedure:  WIDE LOCAL EXCISION OF POSTERIOR NECK ABSCESS;  Surgeon: Ralene Ok, MD;  Location: [facility];  Service: General;  Laterality: N/A;  . Cardiac catheterization    . Left heart catheterization with coronary angiogram N/A 07/27/2012    Procedure: LEFT HEART CATHETERIZATION WITH CORONARY ANGIOGRAM;  Surgeon: Sherren Mocha, MD;  Location: [facility] CATH LAB;  Service: Cardiovascular;  Laterality: N/A;   Family History  Problem Relation Age of Onset  . Diabetes Mother   . Hyperlipidemia Mother    . Depression Mother   . Varicose Veins Mother   . Heart attack Paternal Uncle   . Heart disease Paternal Grandmother   . Heart attack Paternal Grandmother   . Heart attack Paternal Grandfather   . Heart disease Paternal Grandfather   . Heart attack Father    Social History  Substance Use Topics  . Smoking status: Former Smoker -- 0.30 packs/day for .3 years    Types: Cigarettes    Quit date: 12/06/1993  . Smokeless tobacco: Never Used  . Alcohol Use: No   OB History    Gravida Para Term Preterm AB TAB SAB Ectopic Multiple Living   2 1   1  1   1      Review of Systems  Constitutional: Negative for fever.  HENT: Negative for congestion and trouble swallowing.        See HPI.  Respiratory: Negative for shortness of breath and stridor.   Gastrointestinal: Negative for vomiting and abdominal pain.  Musculoskeletal: Negative for neck pain.      Allergies  Kiwi extract and Nubain  Home Medications   Prior to Admission medications   Medication Sig Start Date End Date Taking? Authorizing Provider  atorvastatin (LIPITOR) 20 MG tablet Take 20 mg by mouth daily.   Yes Historical Provider, MD  carvedilol (COREG) 12.5 MG tablet TAKE 1 TABLET BY MOUTH  TWICE DAILY WITH A MEAL 08/12/15  Yes [name], MD  cetirizine (ZYRTEC) 10 MG tablet TAKE 1 TABLET BY MOUTH DAILY 07/16/15  Yes [name], MD  Cholecalciferol 50000 UNITS TABS Take 1 tablet by mouth once a week. For 8 weeks 08/06/15  Yes [name], MD  clotrimazole (LOTRIMIN) 1 % cream APPLY TOPICALLY 2 TIMES A DAY AS NEEDED FOR SKIN YEAST 07/16/15  Yes [name], MD  cyclobenzaprine (FLEXERIL) 10 MG tablet TAKE 1 TABLET BY MOUTH TWICE DAILY AS NEEDED FOR MUSCLE SPASMS 08/05/15  Yes [name], MD  fluconazole (DIFLUCAN) 150 MG tablet Take 1 tablet (150 mg total) by mouth once. May repeat in 2 days if still having symptoms. 08/22/15  Yes Tresea Mall, CNM  fluticasone (FLONASE) 50 MCG/ACT  nasal spray Place 2 sprays into both nostrils daily. 05/21/14  Yes Melony Overly, MD  gabapentin (NEURONTIN) 300 MG capsule Take 4 capsules (1,200 mg total) by mouth 3 (three) times daily. 08/02/15  Yes Gareth Morgan, MD  HYDROcodone-acetaminophen (NORCO) 10-325 MG per tablet Take 1 tablet by mouth every 6 (six) hours as needed. 09/01/15  Yes [name], MD  hydrocortisone cream 0.5 % Apply 1 application topically 2 (two) times daily as needed for itching. To feet 02/26/15  Yes [name], MD  ibuprofen (ADVIL,MOTRIN) 800 MG tablet Take 1 tablet (800 mg total) by mouth 3 (three) times daily with meals. 12/29/14  Yes [name], PA-C  Insulin Glargine (LANTUS SOLOSTAR) 100 UNIT/ML Solostar Pen Inject 600 Units into the skin every morning. And pen needles 4/day 08/05/15  Yes [name], MD  lisinopril (PRINIVIL,ZESTRIL) 10 MG tablet Take 1 tablet (10 mg total) by mouth daily. 03/12/14  Yes [name], MD  nitroGLYCERIN (NITROSTAT) 0.4 MG SL tablet DISSOLVE 1 TABLET UNDER THE TONGUE EVERY 5 MINUTES AS NEEDED FOR CHEST PAIN. 04/03/15  Yes [name], MD  pantoprazole (PROTONIX) 40 MG tablet TAKE 1 TABLET BY MOUTH EVERY DAY 06/10/15  Yes Alveda Reasons, MD  PROCTOSOL HC 2.5 % rectal cream APPLY TO RECTAL AREA TWICE DAILY 06/10/15  Yes Alveda Reasons, MD  PROVENTIL HFA 108 (90 BASE) MCG/ACT inhaler INHALE 1 PUFF BY MOUTH EVERY 6 HOURS AS NEEDED FOR WHEEZING OR SHORTNESS OF BREATH. 07/16/15  Yes [name], MD  rOPINIRole (REQUIP) 0.25 MG tablet Take one pill 1-3 hours before bedtime. May go up after 2 days to 0.5 mg daily (two pills), and after 7 days to 1 mg daily (4 pills). 09/01/15  Yes [name], MD  SYMBICORT 160-4.5 MCG/ACT inhaler INHALE 2 PUFFS INTO LUNGS TWICE A DAY 08/12/15  Yes [name], MD  topiramate (TOPAMAX) 25 MG tablet TAKE 1 TABLET BY MOUTH EVERY NIGHT AT BEDTIME 07/16/15  Yes [name], MD  traMADol (ULTRAM) 50 MG tablet  Take 1 tablet by mouth every 6 hours as needed for severe pain in leg 06/24/15  Yes [name], MD  VOLTAREN 1 % GEL APPLY 2 GRAMS TOPICALLY TO AFFECTED AREA TWICE DAILY 06/10/15  Yes Alveda Reasons, MD  ACCU-CHEK FASTCLIX LANCETS MISC Use to check blood sugar 2 times per day. 10/07/14   [name], MD  ACCU-CHEK SMARTVIEW test strip USE TO CHECK BLOOD SUGAR 4 TIMES A DAY 06/10/15   Alveda Reasons, MD  EASY TOUCH PEN NEEDLES 31G X 8 MM MISC USE TO INJECT INSULIN, 7 TIMES A DAY, OR AS  DIRECTED. 06/10/15   Alveda Reasons, MD  pantoprazole (PROTONIX) 40 MG tablet TAKE 1 TABLET BY MOUTH EVERY DAY Patient not taking: Reported on 08/02/2015 05/08/15   [name], MD   BP 132/96 mmHg  Pulse 106  Temp(Src) 98.3 F (36.8 C) (Oral)  Resp 20  Ht 5\' 2"  (1.575 m)  Wt 311 lb (141.069 kg)  BMI 56.87 kg/m2  SpO2 96%  LMP 08/25/2015 Physical Exam  Constitutional: She is oriented to person, place, and time. She appears well-developed and well-nourished. No distress.  HENT:  Head: Normocephalic.  Mouth/Throat: Oropharynx is clear and moist.  Eyes: Conjunctivae are normal.  Neck: Normal range of motion. Neck supple.  Cardiovascular: Normal rate.   No murmur heard. Pulmonary/Chest: Effort normal. She has no wheezes. She has no rales. She exhibits no tenderness.  Musculoskeletal: Normal range of motion.  Lymphadenopathy:    She has no cervical adenopathy.  Neurological: She is alert and oriented to person, place, and time.  Skin: Skin is warm and dry.    ED Course  Procedures (including critical care time) Labs Review Labs Reviewed - No data to display  Imaging Review No results found. I have personally reviewed and evaluated these images and lab results as part of my medical decision-making.   EKG Interpretation None      MDM   Final diagnoses:  None    1. Dysphagia  She is handling secreation, solids and liquids. She is in NAD and appears comfortable. Symptoms  likely secondary to esophageal irritation from pill that became stuck 24 hours prior. GI cocktail provided. Recommended PCP follow up for recheck.     Charlann Lange, PA-C 09/02/15 0615  Veatrice Kells, MD 09/02/15 [phone]

## 2015-09-05 DIAGNOSIS — B3731 Acute candidiasis of vulva and vagina: Secondary | ICD-10-CM | POA: Insufficient documentation

## 2015-09-05 DIAGNOSIS — B373 Candidiasis of vulva and vagina: Secondary | ICD-10-CM | POA: Insufficient documentation

## 2015-09-05 DIAGNOSIS — E559 Vitamin D deficiency, unspecified: Secondary | ICD-10-CM | POA: Insufficient documentation

## 2015-09-05 NOTE — Assessment & Plan Note (Signed)
Refill norco #90 pills. Follow up in 1 mo.

## 2015-09-05 NOTE — Assessment & Plan Note (Signed)
Start trial of ropinirole. Instructions given for uptitration. Follow up with me in 1 month.

## 2015-09-05 NOTE — Assessment & Plan Note (Signed)
Markedly low vit D prompts need for additonal testing: TTG antibodies, phosphorus, PTH & calcium Continue weekly supplementation Follow up in 1 mo

## 2015-09-05 NOTE — Assessment & Plan Note (Signed)
Suspect this is related to uncontrolled diabetes.  Check HIV today May need to discuss probiotics etc at next visit.

## 2015-09-10 ENCOUNTER — Encounter: Payer: Self-pay | Admitting: Vascular Surgery

## 2015-09-11 ENCOUNTER — Other Ambulatory Visit: Payer: Self-pay | Admitting: Family Medicine

## 2015-09-11 ENCOUNTER — Encounter: Payer: Self-pay | Admitting: Family Medicine

## 2015-09-12 ENCOUNTER — Other Ambulatory Visit: Payer: Self-pay | Admitting: Endocrinology

## 2015-09-12 ENCOUNTER — Other Ambulatory Visit: Payer: Self-pay | Admitting: Family Medicine

## 2015-09-12 ENCOUNTER — Ambulatory Visit: Payer: Medicaid Other | Admitting: Vascular Surgery

## 2015-09-15 ENCOUNTER — Telehealth: Payer: Self-pay | Admitting: Family Medicine

## 2015-09-15 MED ORDER — FLUCONAZOLE 150 MG PO TABS: 150.0000 mg | ORAL_TABLET | Freq: Once | ORAL | Status: DC

## 2015-09-15 NOTE — Telephone Encounter (Signed)
Pt states that medication is causing her to have chest pain and would like to talk to Dr. [name] asap. Sending to RN Team and [employer]. Fonda Kinder, ASA

## 2015-09-15 NOTE — Telephone Encounter (Signed)
Discussed with patient - asked that [facility] call [facility] and have them send over order request, which I will be happy to sign. Patient agreeable [name], MD

## 2015-09-15 NOTE — Telephone Encounter (Signed)
Return call to patient regarding medication given for restless leg.  Patient stated that the medication causes her chest to hurt.  Last time she took the medication was Saturday night.  Please advise.  Derl Barrow, RN

## 2015-09-15 NOTE — Telephone Encounter (Signed)
Called patient. Began having worsening chest pain each night after taking ropinirole. Stopped it and has not had pain since. Advised she stay off it if it was giving her chest pain.  She is already on gabapentin, so I'm not sure what else we can try for her restless leg syndrome. Will route to Dr. Valentina Lucks to see if he has any ideas.  Additionally she requests refill of diflucan, will send in rx to her pharmacy.  [name], MD

## 2015-09-26 ENCOUNTER — Ambulatory Visit (INDEPENDENT_AMBULATORY_CARE_PROVIDER_SITE_OTHER): Payer: Medicaid Other | Admitting: Family Medicine

## 2015-09-26 ENCOUNTER — Encounter: Payer: Self-pay | Admitting: Family Medicine

## 2015-09-26 VITALS — BP 120/84 | HR 80 | Temp 97.7°F | Ht 62.0 in | Wt 307.0 lb

## 2015-09-26 DIAGNOSIS — R51 Headache: Secondary | ICD-10-CM | POA: Diagnosis not present

## 2015-09-26 DIAGNOSIS — G8929 Other chronic pain: Secondary | ICD-10-CM

## 2015-09-26 DIAGNOSIS — G2581 Restless legs syndrome: Secondary | ICD-10-CM

## 2015-09-26 DIAGNOSIS — I10 Essential (primary) hypertension: Secondary | ICD-10-CM | POA: Diagnosis not present

## 2015-09-26 DIAGNOSIS — Z7189 Other specified counseling: Secondary | ICD-10-CM | POA: Diagnosis not present

## 2015-09-26 DIAGNOSIS — L089 Local infection of the skin and subcutaneous tissue, unspecified: Secondary | ICD-10-CM | POA: Diagnosis not present

## 2015-09-26 MED ORDER — PRAMIPEXOLE DIHYDROCHLORIDE 0.125 MG PO TABS: 0.1250 mg | ORAL_TABLET | Freq: Every day | ORAL | Status: DC

## 2015-09-26 MED ORDER — HYDROCODONE-ACETAMINOPHEN 10-325 MG PO TABS: 1.0000 | ORAL_TABLET | Freq: Three times a day (TID) | ORAL | Status: DC | PRN

## 2015-09-26 NOTE — Assessment & Plan Note (Signed)
Stable. Refill of norco 90 pills. Follow up in 1 month.

## 2015-09-26 NOTE — Assessment & Plan Note (Signed)
Didn't tolerate ropinirole. Discussed with pharmacist Dr. Valentina Lucks, will try another dopamine receptor agonist. rx pramipexole. Instructions given for how to titrate upward Also given handout on sleep hygiene, suspect this is playing at least a moderate part in her sleep difficulties Follow up in 1 month

## 2015-09-26 NOTE — Progress Notes (Signed)
Patient ID: Bobette Leyh, female   DOB: Apr 29, 1976, 39 y.o.   MRN: [id number] Date of Visit: 09/26/2015   HPI:  Patient presents for routine follow up.  Restless legs - did not tolerate ropinirole due to developing chest discomfort that stopped when she stopped the medication. Still having lots of trouble sleeping due to RLS symptoms. Lays in bed for several hours before she is able to fall asleep. Sleeps a lot during daytime as well.  Chronic pain - taking norco three times daily. This medicine helps her be functional. Needs refill. Has continued walking regularly.  ROS: See HPI.  [place]: history of morbid obesity, type 2 diabetes, hypertension, hyperlipidemia, RLS  PHYSICAL EXAM: BP 120/84 mmHg  Pulse 80  Temp(Src) 97.7 F (36.5 C) (Oral)  Ht 5\' 2"  (1.575 m)  Wt 307 lb (139.254 kg)  BMI 56.14 kg/m2  LMP 08/25/2015 Gen: NAD, pleasant, cooperative HEENT: NCAT Lungs: normal work of breathing  Neuro: grossly nonfocal, speech normal Ext: legs nontender to palpation. Normal gait.   ASSESSMENT/PLAN:  Restless leg syndrome Didn't tolerate ropinirole. Discussed with pharmacist Dr. Valentina Lucks, will try another dopamine receptor agonist. rx pramipexole. Instructions given for how to titrate upward Also given handout on sleep hygiene, suspect this is playing at least a moderate part in her sleep difficulties Follow up in 1 month  Encounter for chronic pain management Stable. Refill of norco 90 pills. Follow up in 1 month.   FOLLOW UP: F/u in 1 month for RLS and chronic pain  [name], [place]

## 2015-09-26 NOTE — Patient Instructions (Signed)
Start with one tablet (0.125 mg) once daily 2 to 3 hours before bedtime. If you tolerate that, after 4-7 days go up to 2 tablets before bedtime   If you tolerate the 2 tablets, after 4-7 days of that can go up 4 tablets before bedtime  Refilled pain medicine See me in 1 month See sleep hygiene handout Call with any questions  Be well, Dr. [name]

## 2015-09-29 ENCOUNTER — Emergency Department (HOSPITAL_COMMUNITY): Payer: Medicaid Other

## 2015-09-29 ENCOUNTER — Other Ambulatory Visit: Payer: Self-pay

## 2015-09-29 ENCOUNTER — Encounter (HOSPITAL_COMMUNITY): Payer: Self-pay | Admitting: Emergency Medicine

## 2015-09-29 ENCOUNTER — Emergency Department (HOSPITAL_COMMUNITY)
Admission: EM | Admit: 2015-09-29 | Discharge: 2015-09-29 | Discharge status: Against Medical Advice | Payer: Medicaid Other | Attending: Emergency Medicine | Admitting: Emergency Medicine

## 2015-09-29 DIAGNOSIS — Z8669 Personal history of other diseases of the nervous system and sense organs: Secondary | ICD-10-CM | POA: Insufficient documentation

## 2015-09-29 DIAGNOSIS — R079 Chest pain, unspecified: Secondary | ICD-10-CM | POA: Insufficient documentation

## 2015-09-29 DIAGNOSIS — E785 Hyperlipidemia, unspecified: Secondary | ICD-10-CM | POA: Insufficient documentation

## 2015-09-29 DIAGNOSIS — Z872 Personal history of diseases of the skin and subcutaneous tissue: Secondary | ICD-10-CM | POA: Diagnosis not present

## 2015-09-29 DIAGNOSIS — Z87891 Personal history of nicotine dependence: Secondary | ICD-10-CM | POA: Insufficient documentation

## 2015-09-29 DIAGNOSIS — J449 Chronic obstructive pulmonary disease, unspecified: Secondary | ICD-10-CM | POA: Diagnosis not present

## 2015-09-29 DIAGNOSIS — Z7951 Long term (current) use of inhaled steroids: Secondary | ICD-10-CM | POA: Insufficient documentation

## 2015-09-29 DIAGNOSIS — Z794 Long term (current) use of insulin: Secondary | ICD-10-CM | POA: Insufficient documentation

## 2015-09-29 DIAGNOSIS — I10 Essential (primary) hypertension: Secondary | ICD-10-CM | POA: Diagnosis not present

## 2015-09-29 DIAGNOSIS — M1990 Unspecified osteoarthritis, unspecified site: Secondary | ICD-10-CM | POA: Insufficient documentation

## 2015-09-29 DIAGNOSIS — Z862 Personal history of diseases of the blood and blood-forming organs and certain disorders involving the immune mechanism: Secondary | ICD-10-CM | POA: Insufficient documentation

## 2015-09-29 DIAGNOSIS — E119 Type 2 diabetes mellitus without complications: Secondary | ICD-10-CM | POA: Diagnosis not present

## 2015-09-29 DIAGNOSIS — F3181 Bipolar II disorder: Secondary | ICD-10-CM | POA: Insufficient documentation

## 2015-09-29 DIAGNOSIS — K219 Gastro-esophageal reflux disease without esophagitis: Secondary | ICD-10-CM | POA: Diagnosis not present

## 2015-09-29 DIAGNOSIS — Z79899 Other long term (current) drug therapy: Secondary | ICD-10-CM | POA: Insufficient documentation

## 2015-09-29 LAB — CBC
HCT: 39.8 % (ref 36.0–46.0)
Hemoglobin: 13.5 g/dL (ref 12.0–15.0)
MCH: 29.4 pg (ref 26.0–34.0)
MCHC: 33.9 g/dL (ref 30.0–36.0)
MCV: 86.7 fL (ref 78.0–100.0)
PLATELETS: 287 10*3/uL (ref 150–400)
RBC: 4.59 MIL/uL (ref 3.87–5.11)
RDW: 12.5 % (ref 11.5–15.5)
WBC: 9.7 10*3/uL (ref 4.0–10.5)

## 2015-09-29 LAB — BASIC METABOLIC PANEL
ANION GAP: 9 (ref 5–15)
BUN: 11 mg/dL (ref 6–20)
CALCIUM: 8.7 mg/dL — AB (ref 8.9–10.3)
CO2: 25 mmol/L (ref 22–32)
CREATININE: 0.55 mg/dL (ref 0.44–1.00)
Chloride: 98 mmol/L — ABNORMAL LOW (ref 101–111)
GFR calc Af Amer: >60 mL/min (ref >60)
Glucose, Bld: 372 mg/dL — ABNORMAL HIGH (ref 65–99)
POTASSIUM: 3.6 mmol/L (ref 3.5–5.1)
Sodium: 132 mmol/L — ABNORMAL LOW (ref 135–145)

## 2015-09-29 LAB — I-STAT TROPONIN, ED: Troponin i, poc: 0 ng/mL (ref 0.00–0.08)

## 2015-09-29 MED ORDER — FENTANYL CITRATE (PF) 100 MCG/2ML IJ SOLN
INTRAMUSCULAR | Status: AC
  Filled 2015-09-29: qty 2

## 2015-09-29 MED ORDER — FENTANYL CITRATE (PF) 100 MCG/2ML IJ SOLN
50.0000 ug | Freq: Once | INTRAMUSCULAR | Status: AC
  Administered 2015-09-29: 50 ug via INTRAVENOUS

## 2015-09-29 NOTE — ED Provider Notes (Signed)
CSN: [id number]     Arrival date & time 09/29/15  0808 History   First MD Initiated Contact with Patient 09/29/15 0813     No chief complaint on file.    Patient is a 39 y.o. female presenting with chest pain. The history is provided by the patient and the EMS personnel. No language interpreter was used.  Chest Pain  [name] presents for evaluation of left-sided chest pain. Pain started this morning at 5 AM at rest and she took 2 of her nitroglycerin at home with partial relief. Pain is described as a left upper chest pressure and tugging sensation that at times goes to her left arm. EMS provided aspirin and one nitroglycerin with partial relief. She no longer has pain in her left arm. She denies any recent illnesses or injuries. She denies fevers, shortness of breath, cough, abdominal pain, nausea, vomiting. She had a heart cath 2 years ago without any evidence of obstruction at that time. She does have a history of diabetes, hypertension, hyperlipidemia.  Past Medical History  Diagnosis Date  . Morbid obesity ([place])   . Alveolar hypoventilation   . Asthma   . GERD (gastroesophageal reflux disease)   . Rectal fissure   . Hypertension   . Costochondritis   . Cellulitis 08/2010-08/2011  . Depression   . Bipolar 2 disorder ([place])   . Obstructive sleep apnea   . Chest pain     NO CAD by cath 0/62/37, nl LV systolic fxn  . Diabetes mellitus 2000    Type 2, Uncontrolled  . Arthritis   . HLD (hyperlipidemia)   . Anemia   . COPD (chronic obstructive pulmonary disease) ([place])   . Varicose veins     Right medial thigh and Left leg    Past Surgical History  Procedure Laterality Date  . Carpal tunnel release    . Bil knee surgery    . Mass excision N/A 06/29/2013    Procedure:  WIDE LOCAL EXCISION OF POSTERIOR NECK ABSCESS;  Surgeon: Ralene Ok, MD;  Location: [place];  Service: General;  Laterality: N/A;  . Cardiac catheterization    . Left heart catheterization with coronary  angiogram N/A 07/27/2012    Procedure: LEFT HEART CATHETERIZATION WITH CORONARY ANGIOGRAM;  Surgeon: Sherren Mocha, MD;  Location: [facility] CATH LAB;  Service: Cardiovascular;  Laterality: N/A;   Family History  Problem Relation Age of Onset  . Diabetes Mother   . Hyperlipidemia Mother   . Depression Mother   . Varicose Veins Mother   . Heart attack Paternal Uncle   . Heart disease Paternal Grandmother   . Heart attack Paternal Grandmother   . Heart attack Paternal Grandfather   . Heart disease Paternal Grandfather   . Heart attack Father    Social History  Substance Use Topics  . Smoking status: Former Smoker -- 0.30 packs/day for .3 years    Types: Cigarettes    Quit date: 12/06/1993  . Smokeless tobacco: Never Used  . Alcohol Use: No   OB History    Gravida Para Term Preterm AB TAB SAB Ectopic Multiple Living   2 1   1  1   1      Review of Systems  Cardiovascular: Positive for chest pain.  All other systems reviewed and are negative.     Allergies  Kiwi extract and Nubain  Home Medications   Prior to Admission medications   Medication Sig Start Date End Date Taking? Authorizing Provider  ACCU-CHEK  FASTCLIX LANCETS MISC USE TO CHECK BLOOD SUGAR 2 TIMES A DAY 09/15/15   [name], MD  ACCU-CHEK SMARTVIEW test strip USE TO CHECK BLOOD SUGAR 4 TIMES A DAY 06/10/15   Alveda Reasons, MD  atorvastatin (LIPITOR) 20 MG tablet Take 20 mg by mouth daily.    Historical Provider, MD  carvedilol (COREG) 12.5 MG tablet TAKE 1 TABLET BY MOUTH TWICE DAILY WITH A MEAL 08/12/15   [name], MD  cetirizine (ZYRTEC) 10 MG tablet TAKE 1 TABLET BY MOUTH DAILY 07/16/15   [name], MD  Cholecalciferol 50000 UNITS TABS Take 1 tablet by mouth once a week. For 8 weeks 08/06/15   [name], MD  clotrimazole (LOTRIMIN) 1 % cream APPLY TOPICALLY 2 TIMES A DAY AS NEEDED FOR SKIN YEAST 07/16/15   [name], MD  cyclobenzaprine (FLEXERIL) 10 MG tablet TAKE 1 TABLET  BY MOUTH TWICE DAILY AS NEEDED FOR MUSCLE SPASMS 08/05/15   [name], MD  EASY TOUCH PEN NEEDLES 31G X 8 MM MISC USE TO INJECT INSULIN, 7 TIMES A DAY, OR AS DIRECTED. 06/10/15   Alveda Reasons, MD  fluconazole (DIFLUCAN) 150 MG tablet Take 1 tablet (150 mg total) by mouth once. May repeat in 3 days if still having symptoms. 09/15/15   [name], MD  fluticasone (FLONASE) 50 MCG/ACT nasal spray Place 2 sprays into both nostrils daily. 05/21/14   Melony Overly, MD  gabapentin (NEURONTIN) 300 MG capsule Take 4 capsules (1,200 mg total) by mouth 3 (three) times daily. 08/02/15   Gareth Morgan, MD  HYDROcodone-acetaminophen (NORCO) 10-325 MG tablet Take 1 tablet by mouth 3 (three) times daily as needed for severe pain. 09/26/15   [name], MD  hydrocortisone cream 0.5 % Apply 1 application topically 2 (two) times daily as needed for itching. To feet 02/26/15   [name], MD  ibuprofen (ADVIL,MOTRIN) 800 MG tablet Take 1 tablet (800 mg total) by mouth 3 (three) times daily with meals. 12/29/14   [name], PA-C  Insulin Glargine (LANTUS SOLOSTAR) 100 UNIT/ML Solostar Pen Inject 600 Units into the skin every morning. And pen needles 4/day 08/05/15   [name], MD  lisinopril (PRINIVIL,ZESTRIL) 10 MG tablet Take 1 tablet (10 mg total) by mouth daily. 03/12/14   [name], MD  nitroGLYCERIN (NITROSTAT) 0.4 MG SL tablet DISSOLVE 1 TABLET UNDER THE TONGUE EVERY 5 MINUTES AS NEEDED FOR CHEST PAIN. 04/03/15   [name], MD  pantoprazole (PROTONIX) 40 MG tablet TAKE 1 TABLET BY MOUTH EVERY DAY 09/15/15   [name], MD  pramipexole (MIRAPEX) 0.125 MG tablet Take 1 tablet (0.125 mg total) by mouth at bedtime. 09/26/15   [name], MD  PROCTOSOL HC 2.5 % rectal cream APPLY TO RECTAL AREA TWICE DAILY 06/10/15   Alveda Reasons, MD  PROVENTIL HFA 108 (90 BASE) MCG/ACT inhaler INHALE 1 PUFF BY MOUTH EVERY 6 HOURS AS NEEDED FOR WHEEZING OR  SHORTNESS OF BREATH. 07/16/15   [name], MD  SYMBICORT 160-4.5 MCG/ACT inhaler INHALE 2 PUFFS INTO LUNGS TWICE A DAY 08/12/15   [name], MD  topiramate (TOPAMAX) 25 MG tablet TAKE 1 TABLET BY MOUTH EVERY NIGHT AT BEDTIME 09/15/15   [name], MD  traMADol (ULTRAM) 50 MG tablet TAKE 1 TABLET BY MOUTH EVERY DAY AS NEEDED FOR MODERATE PAIN **MUST LAST 30 DAYS** 09/15/15   [name], MD  VOLTAREN  1 % GEL APPLY 2 GRAMS TOPICALLY TO AFFECTED AREA TWICE DAILY 06/10/15   Alveda Reasons, MD   LMP 08/25/2015 Physical Exam  Constitutional: She is oriented to person, place, and time. She appears well-developed and well-nourished.  HENT:  Head: Normocephalic and atraumatic.  Cardiovascular: Normal rate and regular rhythm.   No murmur heard. Pulmonary/Chest: Effort normal and breath sounds normal. No respiratory distress. She exhibits tenderness.  Abdominal: Soft. There is no tenderness. There is no rebound and no guarding.  Musculoskeletal: She exhibits no edema or tenderness.  2+ DP pulses bilaterally  Neurological: She is alert and oriented to person, place, and time.  Skin: Skin is warm and dry.  Psychiatric: She has a normal mood and affect. Her behavior is normal.  Nursing note and vitals reviewed.   ED Course  Procedures (including critical care time) Labs Review Labs Reviewed - No data to display  Imaging Review No results found. I have personally reviewed and evaluated these images and lab results as part of my medical decision-making.  ED ECG REPORT   Date: 09/29/2015  Rate: 105  Rhythm: sinus tachycardia  QRS Axis: right  Intervals: normal  ST/T Wave abnormalities: normal  Conduction Disutrbances:none  Narrative Interpretation:   Old EKG Reviewed: none available  I have personally reviewed the EKG tracing and agree with the computerized printout as noted.   MDM   Final diagnoses:  Chest pain, unspecified chest pain type     Patient here for evaluation of left-sided chest pain, had a negative cardiac catheterization 2 years ago. Presentation is atypical for PE or ACS, chest pain is partially reproducible on examination. Patient is noted to be slightly tachycardic. Plan to check labs and chest x-ray, providing pain control. Patient left the department AMA prior to being able to reevaluate her, there was a delay in return of labs due to system down time.    Quintella Reichert, MD 09/30/15 [phone]

## 2015-09-29 NOTE — ED Notes (Signed)
Pt requesting to know the delay and how long she would be here.  Explained process to pt.  Spoke with Dr Ralene Bathe after who stated she was waiting on lab work.

## 2015-09-29 NOTE — ED Notes (Signed)
Spoke with Dr Ralene Bathe who stated to ask the pt to continue to wait just a little while longer.  Asked pt if she would wait.  Pt refused.  Removed IV and pt signed out AMA.  Aware she can return at any time.

## 2015-09-29 NOTE — ED Notes (Signed)
Pt from home via [facility] with c/o left chest pressure waking from sleep this am around 430-5.  Pt reports radiation to left arm which subsided PTA by EMS and took 2 nitro.  Pain 8/10 on EMS arrival, given 324 mg aspirin and 1 nitro pain decreased to 7/10.  12 lead unremarkable in ST.  Pt in NAD, A&O.

## 2015-09-29 NOTE — ED Notes (Signed)
Called lab to request copy of results

## 2015-09-29 NOTE — ED Notes (Signed)
Called lab to request copy of results a second time.

## 2015-09-29 NOTE — ED Notes (Signed)
Pt requesting to have IV removed so she can leave.

## 2015-10-06 ENCOUNTER — Ambulatory Visit: Payer: Medicaid Other | Admitting: Endocrinology

## 2015-10-07 ENCOUNTER — Telehealth: Payer: Self-pay | Admitting: Endocrinology

## 2015-10-07 NOTE — Telephone Encounter (Signed)
[name], Could you please contact the pt to reschedule? Thanks!

## 2015-10-07 NOTE — Telephone Encounter (Signed)
Patient no showed today's appt. Please advise on how to follow up. °A. No follow up necessary. °B. Follow up urgent. Contact patient immediately. °C. Follow up necessary. Contact patient and schedule visit in ___ days. °D. Follow up advised. Contact patient and schedule visit in ____weeks. ° °

## 2015-10-07 NOTE — Telephone Encounter (Signed)
Follow up advised. Contact patient and schedule visit in 2 weeks. 

## 2015-10-08 NOTE — Telephone Encounter (Signed)
Called patient and reschedule the appointment.

## 2015-10-10 ENCOUNTER — Encounter: Payer: Self-pay | Admitting: Vascular Surgery

## 2015-10-10 ENCOUNTER — Other Ambulatory Visit: Payer: Self-pay | Admitting: Family Medicine

## 2015-10-13 ENCOUNTER — Other Ambulatory Visit: Payer: Self-pay | Admitting: Family Medicine

## 2015-10-14 ENCOUNTER — Other Ambulatory Visit: Payer: Self-pay | Admitting: Family Medicine

## 2015-10-14 ENCOUNTER — Ambulatory Visit: Payer: Medicaid Other | Admitting: Vascular Surgery

## 2015-10-15 ENCOUNTER — Ambulatory Visit: Payer: Medicaid Other | Admitting: Endocrinology

## 2015-10-15 DIAGNOSIS — Z0289 Encounter for other administrative examinations: Secondary | ICD-10-CM

## 2015-10-17 ENCOUNTER — Telehealth: Payer: Self-pay | Admitting: Endocrinology

## 2015-10-17 NOTE — Telephone Encounter (Signed)
Patient no showed today's appt. Please advise on how to follow up. °A. No follow up necessary. °B. Follow up urgent. Contact patient immediately. °C. Follow up necessary. Contact patient and schedule visit in ___ days. °D. Follow up advised. Contact patient and schedule visit in ____weeks. ° °

## 2015-10-18 NOTE — Telephone Encounter (Signed)
Follow up advised. Contact patient and schedule visit in 4 weeks 

## 2015-10-20 NOTE — Telephone Encounter (Signed)
No show letter mailed to the pt.  

## 2015-10-21 ENCOUNTER — Telehealth: Payer: Self-pay | Admitting: Family Medicine

## 2015-10-21 MED ORDER — NITROGLYCERIN 0.4 MG SL SUBL: SUBLINGUAL_TABLET | SUBLINGUAL | Status: DC

## 2015-10-21 NOTE — Telephone Encounter (Signed)
I assume this is for [facility] referral purposes. Tia, can you call and authorize more visits? That's fine as far as I'm concerned. Thanks, [name], MD

## 2015-10-21 NOTE — Telephone Encounter (Signed)
Pt called and would like the doctor to call and give her OBGYN more visits. jw

## 2015-10-21 NOTE — Telephone Encounter (Signed)
Done

## 2015-10-28 ENCOUNTER — Ambulatory Visit (INDEPENDENT_AMBULATORY_CARE_PROVIDER_SITE_OTHER): Payer: Medicaid Other | Admitting: Family Medicine

## 2015-10-28 ENCOUNTER — Encounter: Payer: Self-pay | Admitting: Family Medicine

## 2015-10-28 VITALS — BP 135/78 | HR 102 | Temp 98.3°F | Ht 62.0 in | Wt 304.4 lb

## 2015-10-28 DIAGNOSIS — Z7189 Other specified counseling: Secondary | ICD-10-CM

## 2015-10-28 DIAGNOSIS — M654 Radial styloid tenosynovitis [de Quervain]: Secondary | ICD-10-CM

## 2015-10-28 DIAGNOSIS — G2581 Restless legs syndrome: Secondary | ICD-10-CM | POA: Diagnosis not present

## 2015-10-28 DIAGNOSIS — E559 Vitamin D deficiency, unspecified: Secondary | ICD-10-CM

## 2015-10-28 DIAGNOSIS — G8929 Other chronic pain: Secondary | ICD-10-CM

## 2015-10-28 DIAGNOSIS — R51 Headache: Secondary | ICD-10-CM | POA: Diagnosis not present

## 2015-10-28 DIAGNOSIS — L089 Local infection of the skin and subcutaneous tissue, unspecified: Secondary | ICD-10-CM | POA: Diagnosis not present

## 2015-10-28 DIAGNOSIS — I10 Essential (primary) hypertension: Secondary | ICD-10-CM | POA: Diagnosis not present

## 2015-10-28 MED ORDER — PRAMIPEXOLE DIHYDROCHLORIDE 0.125 MG PO TABS: 0.3750 mg | ORAL_TABLET | Freq: Every day | ORAL | Status: DC

## 2015-10-28 MED ORDER — HYDROCODONE-ACETAMINOPHEN 10-325 MG PO TABS: 1.0000 | ORAL_TABLET | Freq: Three times a day (TID) | ORAL | Status: DC | PRN

## 2015-10-28 MED ORDER — WRIST/THUMB SPLINT/RIGHT UNIV MISC: Status: DC

## 2015-10-28 MED ORDER — WRIST/THUMB SPLINT/LEFT UNIV MISC: Status: DC

## 2015-10-28 NOTE — Patient Instructions (Signed)
Wear splints on thumbs Try ibuprofen 400mg  every 6 hours as needed - use sparingly Refilled pain medicine for 1 month. Rechecking vitamin D level Sent in refill of pramipexole for restless leg syndrome See me in 1 month  Be well, Dr. [name]

## 2015-10-28 NOTE — Progress Notes (Signed)
Date of Visit: 10/28/2015   HPI:  Restless legs - doing much better on pramipexole. Taking 3 tabs at night. Wants to stay on this dose.   Hand pain - having pain in both of her hands but R worse than L. Located along her thumb primarily. History of bilateral carpal tunnel release in the past.   Chronic pain - taking norco three times daily. Pain medicine is helping her be functional.   Vit D - finished out her course of weekly vit D replacement last week. Due for vit D recheck. Thinks she feels a lot better after being on that medicine.  ROS: See HPI.  [place]: history of depression, carpal tunnel, chronic knee/back pain, GERD, type 2 diabetes, morbid obesity, hypertension, asthma, hyperlipidemia,   PHYSICAL EXAM: BP 135/78 mmHg  Pulse 102  Temp(Src) 98.3 F (36.8 C) (Oral)  Ht 5\' 2"  (1.575 m)  Wt 304 lb 6.4 oz (138.075 kg)  BMI 55.66 kg/m2  LMP 10/23/2015 Gen: NAD, pleasant, cooperative HEENT: NCAT Ext: bilateral wrists tender to palpation over radial styloid. + finkelsteins test bilaterally. Grip 5/5 bilateral. Sensation intact over distal fingertips. Carpal tunnel release scars visible. No thenar atrophy.  ASSESSMENT/PLAN:  Encounter for chronic pain management Stable. Refill norco for 1 month (hold off on longer refills due to current hand pain). Follow up in 1 month.   De Quervain's tenosynovitis, bilateral Suspect de quervains bilaterally. Neurovascularly intact. Will proceed with conservative trial of thumb spica splinting and intermittent NSAIDs. Want to avoid prolonged or persistent NSAIDs due to medical comorbidities. Follow up in 1 month for re-evaluation.   Vitamin D deficiency Recheck vit D level today & replace as needed based on result.  Restless leg syndrome Doing well on current dose of pramipexole. Continue this medication.   FOLLOW UP: F/u in 1 month for above issues  [name], [place]

## 2015-10-29 ENCOUNTER — Other Ambulatory Visit: Payer: Self-pay | Admitting: Family Medicine

## 2015-10-29 ENCOUNTER — Encounter: Payer: Self-pay | Admitting: Family Medicine

## 2015-10-29 LAB — VITAMIN D 25 HYDROXY (VIT D DEFICIENCY, FRACTURES): VIT D 25 HYDROXY: 12 ng/mL — AB (ref 30–100)

## 2015-10-29 MED ORDER — CHOLECALCIFEROL 1.25 MG (50000 UT) PO TABS: 1.0000 | ORAL_TABLET | ORAL | Status: DC

## 2015-11-01 DIAGNOSIS — M654 Radial styloid tenosynovitis [de Quervain]: Secondary | ICD-10-CM

## 2015-11-01 HISTORY — DX: Radial styloid tenosynovitis (de quervain): M65.4

## 2015-11-01 NOTE — Assessment & Plan Note (Signed)
Stable. Refill norco for 1 month (hold off on longer refills due to current hand pain). Follow up in 1 month.

## 2015-11-01 NOTE — Assessment & Plan Note (Signed)
Doing well on current dose of pramipexole. Continue this medication.

## 2015-11-01 NOTE — Assessment & Plan Note (Signed)
Suspect de quervains bilaterally. Neurovascularly intact. Will proceed with conservative trial of thumb spica splinting and intermittent NSAIDs. Want to avoid prolonged or persistent NSAIDs due to medical comorbidities. Follow up in 1 month for re-evaluation.

## 2015-11-01 NOTE — Assessment & Plan Note (Signed)
Recheck vit D level today & replace as needed based on result.

## 2015-11-12 ENCOUNTER — Encounter (HOSPITAL_COMMUNITY): Payer: Self-pay | Admitting: *Deleted

## 2015-11-12 ENCOUNTER — Emergency Department (INDEPENDENT_AMBULATORY_CARE_PROVIDER_SITE_OTHER)
Admission: EM | Admit: 2015-11-12 | Discharge: 2015-11-12 | Disposition: A | Discharge status: Home / Self Care | Payer: Medicaid Other | Source: Home / Self Care | Attending: Family Medicine | Admitting: Family Medicine

## 2015-11-12 DIAGNOSIS — H6091 Unspecified otitis externa, right ear: Secondary | ICD-10-CM

## 2015-11-12 MED ORDER — NEOMYCIN-POLYMYXIN-HC 3.5-10000-1 OT SUSP: 4.0000 [drp] | Freq: Three times a day (TID) | OTIC | Status: DC

## 2015-11-12 MED ORDER — HYDROCODONE-ACETAMINOPHEN 5-325 MG PO TABS: 1.0000 | ORAL_TABLET | Freq: Four times a day (QID) | ORAL | Status: DC | PRN

## 2015-11-12 MED ORDER — CEPHALEXIN 500 MG PO CAPS: 500.0000 mg | ORAL_CAPSULE | Freq: Three times a day (TID) | ORAL | Status: DC

## 2015-11-12 NOTE — ED Notes (Signed)
Earache     X   1  Day     -  Symptoms      X   1  Day    Pain on palpation         Pain  r  Side  Of  Face

## 2015-11-12 NOTE — ED Provider Notes (Signed)
CSN: [id number]     Arrival date & time 11/12/15  1500 History   None    Chief Complaint  Patient presents with  . Otalgia   (Consider location/radiation/quality/duration/timing/severity/associated sxs/prior Treatment) Patient is a 39 y.o. female presenting with ear pain. The history is provided by the patient.  Otalgia Location:  Right Behind ear:  No abnormality Quality:  Throbbing Severity:  Moderate Onset quality:  Sudden Duration:  1 day Progression:  Unchanged Chronicity:  New Relieved by:  None tried Worsened by:  Nothing tried Ineffective treatments:  None tried Associated symptoms: no congestion, no ear discharge, no fever, no rhinorrhea, no sore throat and no tinnitus     Past Medical History  Diagnosis Date  . Morbid obesity ([place])   . Alveolar hypoventilation   . Asthma   . GERD (gastroesophageal reflux disease)   . Rectal fissure   . Hypertension   . Costochondritis   . Cellulitis 08/2010-08/2011  . Depression   . Bipolar 2 disorder ([place])   . Obstructive sleep apnea   . Chest pain     NO CAD by cath [id number], nl LV systolic fxn  . Diabetes mellitus 2000    Type 2, Uncontrolled  . Arthritis   . HLD (hyperlipidemia)   . Anemia   . COPD (chronic obstructive pulmonary disease) ([place])   . Varicose veins     Right medial thigh and Left leg    Past Surgical History  Procedure Laterality Date  . Carpal tunnel release    . Bil knee surgery    . Mass excision N/A 06/29/2013    Procedure:  WIDE LOCAL EXCISION OF POSTERIOR NECK ABSCESS;  Surgeon: Ralene Ok, MD;  Location: [place];  Service: General;  Laterality: N/A;  . Cardiac catheterization    . Left heart catheterization with coronary angiogram N/A 07/27/2012    Procedure: LEFT HEART CATHETERIZATION WITH CORONARY ANGIOGRAM;  Surgeon: Sherren Mocha, MD;  Location: [facility] CATH LAB;  Service: Cardiovascular;  Laterality: N/A;   Family History  Problem Relation Age of Onset  . Diabetes Mother   . Hyperlipidemia  Mother   . Depression Mother   . Varicose Veins Mother   . Heart attack Paternal Uncle   . Heart disease Paternal Grandmother   . Heart attack Paternal Grandmother   . Heart attack Paternal Grandfather   . Heart disease Paternal Grandfather   . Heart attack Father    Social History  Substance Use Topics  . Smoking status: Former Smoker -- 0.30 packs/day for .3 years    Types: Cigarettes    Quit date: 12/06/1993  . Smokeless tobacco: Never Used  . Alcohol Use: No   OB History    Gravida Para Term Preterm AB TAB SAB Ectopic Multiple Living   2 1   1  1   1      Review of Systems  Constitutional: Negative.  Negative for fever.  HENT: Positive for ear pain and facial swelling. Negative for congestion, ear discharge, postnasal drip, rhinorrhea, sore throat and tinnitus.   Respiratory: Negative.   Cardiovascular: Negative.   All other systems reviewed and are negative.   Allergies  Kiwi extract and Nubain  Home Medications   Prior to Admission medications   Medication Sig Start Date End Date Taking? Authorizing Provider  ACCU-CHEK FASTCLIX LANCETS MISC USE TO CHECK BLOOD SUGAR 2 TIMES A DAY 09/15/15   [name], MD  ACCU-CHEK SMARTVIEW test strip USE TO CHECK BLOOD SUGAR 4 TIMES A  DAY 06/10/15   Alveda Reasons, MD  atorvastatin (LIPITOR) 20 MG tablet Take 20 mg by mouth daily.    Historical Provider, MD  carvedilol (COREG) 12.5 MG tablet TAKE 1 TABLET BY MOUTH TWICE DAILY WITH A MEAL 08/12/15   [name], MD  cephALEXin (KEFLEX) 500 MG capsule Take 1 capsule (500 mg total) by mouth 3 (three) times daily. Take all of medicine and drink lots of fluids 11/12/15   Billy Fischer, MD  cetirizine (ZYRTEC) 10 MG tablet TAKE 1 TABLET BY MOUTH DAILY 07/16/15   [name], MD  Cholecalciferol 50000 UNITS TABS Take 1 tablet by mouth once a week. For 12 weeks 10/29/15   [name], MD  clotrimazole (LOTRIMIN) 1 % cream APPLY TOPICALLY 2 TIMES A DAY AS NEEDED FOR  SKIN YEAST 07/16/15   [name], MD  cyclobenzaprine (FLEXERIL) 10 MG tablet TAKE 1 TABLET BY MOUTH TWICE DAILY AS NEEDED FOR MUSCLE SPASMS 08/05/15   [name], MD  EASY TOUCH PEN NEEDLES 31G X 8 MM MISC USE TO INJECT INSULIN, 7 TIMES A DAY, OR AS DIRECTED. 06/10/15   Alveda Reasons, MD  Elastic Bandages & Supports (WRIST/THUMB SPLINT/LEFT UNIV) MISC Wear daily. Dispense thumb spica splint. Patient may choose size. 10/28/15   [name], MD  Elastic Bandages & Supports (WRIST/THUMB SPLINT/RIGHT UNIV) MISC Wear daily. Dispense thumb spica splint. Patient may choose size. 10/28/15   [name], MD  fluconazole (DIFLUCAN) 150 MG tablet Take 1 tablet (150 mg total) by mouth once. May repeat in 3 days if still having symptoms. 09/15/15   [name], MD  fluticasone (FLONASE) 50 MCG/ACT nasal spray Place 2 sprays into both nostrils daily. 05/21/14   Melony Overly, MD  gabapentin (NEURONTIN) 300 MG capsule TAKE 3 CAPSULES BY MOUTH EVERY MORNING AND AFTERNOON AND TAKE 4 CAPSULES BY MOUTH EVERY EVENING. 10/21/15   [name], MD  HYDROcodone-acetaminophen (NORCO/VICODIN) 5-325 MG tablet Take 1 tablet by mouth every 6 (six) hours as needed. For pain 11/12/15   Billy Fischer, MD  hydrocortisone cream 0.5 % Apply 1 application topically 2 (two) times daily as needed for itching. To feet 02/26/15   [name], MD  ibuprofen (ADVIL,MOTRIN) 800 MG tablet Take 1 tablet (800 mg total) by mouth 3 (three) times daily with meals. 12/29/14   [name], PA-C  Insulin Glargine (LANTUS SOLOSTAR) 100 UNIT/ML Solostar Pen Inject 600 Units into the skin every morning. And pen needles 4/day 08/05/15   [name], MD  lisinopril (PRINIVIL,ZESTRIL) 10 MG tablet Take 1 tablet (10 mg total) by mouth daily. 03/12/14   [name], MD  neomycin-polymyxin-hydrocortisone (CORTISPORIN) 3.5-10000-1 otic suspension Place 4 drops into the right ear 3 (three) times daily.  11/12/15   Billy Fischer, MD  nitroGLYCERIN (NITROSTAT) 0.4 MG SL tablet DISSOLVE 1 TABLET UNDER THE TONGUE EVERY 5 MINUTES AS NEEDED FOR CHEST PAIN. 10/21/15   [name], MD  pantoprazole (PROTONIX) 40 MG tablet TAKE 1 TABLET BY MOUTH EVERY DAY 09/15/15   [name], MD  pramipexole (MIRAPEX) 0.125 MG tablet Take 3 tablets (0.375 mg total) by mouth at bedtime. 10/28/15   [name], MD  PROCTOSOL HC 2.5 % rectal cream APPLY TO RECTAL AREA TWICE DAILY 06/10/15   Alveda Reasons, MD  PROVENTIL HFA 108 (90 BASE) MCG/ACT inhaler INHALE 1 PUFF BY MOUTH EVERY 6 HOURS AS NEEDED FOR WHEEZING  OR SHORTNESS OF BREATH. 07/16/15   [name], MD  SYMBICORT 160-4.5 MCG/ACT inhaler INHALE 2 PUFFS INTO LUNGS TWICE A DAY 08/12/15   [name], MD  topiramate (TOPAMAX) 25 MG tablet TAKE 1 TABLET BY MOUTH EVERY NIGHT AT BEDTIME 09/15/15   [name], MD  traMADol (ULTRAM) 50 MG tablet TAKE 1 TABLET BY MOUTH EVERY DAY AS NEEDED FOR MODERATE PAIN **MUST LAST 30 DAYS** 09/15/15   [name], MD  VOLTAREN 1 % GEL APPLY 2 GRAMS TOPICALLY TO AFFECTED AREA TWICE DAILY 06/10/15   Alveda Reasons, MD   Meds Ordered and Administered this Visit  Medications - No data to display  LMP 10/23/2015 No data found.   Physical Exam  Constitutional: She appears well-developed and well-nourished. She appears distressed.  HENT:  Right Ear: Tympanic membrane normal. There is swelling and tenderness. No drainage. No mastoid tenderness. Tympanic membrane is not injected, not scarred, not erythematous and not bulging. No middle ear effusion. No decreased hearing is noted.  Left Ear: Hearing, tympanic membrane, external ear and ear canal normal.  Mouth/Throat: Oropharynx is clear and moist.  Nursing note and vitals reviewed.   ED Course  Procedures (including critical care time)  Labs Review Labs Reviewed - No data to display  Imaging Review No results  found.   Visual Acuity Review  Right Eye Distance:   Left Eye Distance:   Bilateral Distance:    Right Eye Near:   Left Eye Near:    Bilateral Near:         MDM   1. Otitis externa of right ear        Billy Fischer, MD 11/12/15 [phone]

## 2015-11-13 ENCOUNTER — Other Ambulatory Visit: Payer: Self-pay | Admitting: Family Medicine

## 2015-11-23 ENCOUNTER — Encounter (HOSPITAL_COMMUNITY): Payer: Self-pay | Admitting: *Deleted

## 2015-11-23 ENCOUNTER — Emergency Department (HOSPITAL_COMMUNITY)
Admission: EM | Admit: 2015-11-23 | Discharge: 2015-11-23 | Disposition: A | Discharge status: Home / Self Care | Payer: Medicaid Other | Attending: Emergency Medicine | Admitting: Emergency Medicine

## 2015-11-23 ENCOUNTER — Emergency Department (HOSPITAL_COMMUNITY): Payer: Medicaid Other

## 2015-11-23 DIAGNOSIS — F3181 Bipolar II disorder: Secondary | ICD-10-CM | POA: Diagnosis not present

## 2015-11-23 DIAGNOSIS — Z3202 Encounter for pregnancy test, result negative: Secondary | ICD-10-CM | POA: Insufficient documentation

## 2015-11-23 DIAGNOSIS — J449 Chronic obstructive pulmonary disease, unspecified: Secondary | ICD-10-CM | POA: Diagnosis not present

## 2015-11-23 DIAGNOSIS — Z79899 Other long term (current) drug therapy: Secondary | ICD-10-CM | POA: Diagnosis not present

## 2015-11-23 DIAGNOSIS — Z9889 Other specified postprocedural states: Secondary | ICD-10-CM | POA: Insufficient documentation

## 2015-11-23 DIAGNOSIS — J45909 Unspecified asthma, uncomplicated: Secondary | ICD-10-CM | POA: Diagnosis not present

## 2015-11-23 DIAGNOSIS — Z862 Personal history of diseases of the blood and blood-forming organs and certain disorders involving the immune mechanism: Secondary | ICD-10-CM | POA: Insufficient documentation

## 2015-11-23 DIAGNOSIS — Z872 Personal history of diseases of the skin and subcutaneous tissue: Secondary | ICD-10-CM | POA: Diagnosis not present

## 2015-11-23 DIAGNOSIS — R1031 Right lower quadrant pain: Secondary | ICD-10-CM | POA: Insufficient documentation

## 2015-11-23 DIAGNOSIS — E785 Hyperlipidemia, unspecified: Secondary | ICD-10-CM | POA: Insufficient documentation

## 2015-11-23 DIAGNOSIS — K219 Gastro-esophageal reflux disease without esophagitis: Secondary | ICD-10-CM | POA: Insufficient documentation

## 2015-11-23 DIAGNOSIS — Z7952 Long term (current) use of systemic steroids: Secondary | ICD-10-CM | POA: Insufficient documentation

## 2015-11-23 DIAGNOSIS — E119 Type 2 diabetes mellitus without complications: Secondary | ICD-10-CM | POA: Diagnosis not present

## 2015-11-23 DIAGNOSIS — Z7951 Long term (current) use of inhaled steroids: Secondary | ICD-10-CM | POA: Insufficient documentation

## 2015-11-23 DIAGNOSIS — R102 Pelvic and perineal pain: Secondary | ICD-10-CM | POA: Diagnosis not present

## 2015-11-23 DIAGNOSIS — H6691 Otitis media, unspecified, right ear: Secondary | ICD-10-CM | POA: Diagnosis not present

## 2015-11-23 DIAGNOSIS — Z791 Long term (current) use of non-steroidal anti-inflammatories (NSAID): Secondary | ICD-10-CM | POA: Insufficient documentation

## 2015-11-23 DIAGNOSIS — M1990 Unspecified osteoarthritis, unspecified site: Secondary | ICD-10-CM | POA: Diagnosis not present

## 2015-11-23 DIAGNOSIS — R109 Unspecified abdominal pain: Secondary | ICD-10-CM

## 2015-11-23 DIAGNOSIS — R140 Abdominal distension (gaseous): Secondary | ICD-10-CM | POA: Insufficient documentation

## 2015-11-23 DIAGNOSIS — Z794 Long term (current) use of insulin: Secondary | ICD-10-CM | POA: Insufficient documentation

## 2015-11-23 LAB — CBG MONITORING, ED: GLUCOSE-CAPILLARY: 381 mg/dL — AB (ref 65–99)

## 2015-11-23 LAB — URINALYSIS, ROUTINE W REFLEX MICROSCOPIC
Bilirubin Urine: NEGATIVE
Glucose, UA: >1000 mg/dL — AB
Hgb urine dipstick: NEGATIVE
Ketones, ur: NEGATIVE mg/dL
Leukocytes, UA: NEGATIVE
Nitrite: NEGATIVE
Protein, ur: NEGATIVE mg/dL
SPECIFIC GRAVITY, URINE: 1.036 — AB (ref 1.005–1.030)
pH: 5 (ref 5.0–8.0)

## 2015-11-23 LAB — CBC WITH DIFFERENTIAL/PLATELET
BASOS ABS: 0 10*3/uL (ref 0.0–0.1)
Basophils Relative: 0 %
EOS PCT: 2 %
Eosinophils Absolute: 0.2 10*3/uL (ref 0.0–0.7)
HCT: 40.6 % (ref 36.0–46.0)
Hemoglobin: 13.6 g/dL (ref 12.0–15.0)
LYMPHS ABS: 4.3 10*3/uL — AB (ref 0.7–4.0)
LYMPHS PCT: 49 %
MCH: 29.8 pg (ref 26.0–34.0)
MCHC: 33.5 g/dL (ref 30.0–36.0)
MCV: 89 fL (ref 78.0–100.0)
Monocytes Absolute: 0.7 10*3/uL (ref 0.1–1.0)
Monocytes Relative: 8 %
NEUTROS ABS: 3.5 10*3/uL (ref 1.7–7.7)
Neutrophils Relative %: 41 %
PLATELETS: 323 10*3/uL (ref 150–400)
RBC: 4.56 MIL/uL (ref 3.87–5.11)
RDW: 12.7 % (ref 11.5–15.5)
WBC: 8.6 10*3/uL (ref 4.0–10.5)

## 2015-11-23 LAB — URINE MICROSCOPIC-ADD ON
BACTERIA UA: NONE SEEN
RBC / HPF: NONE SEEN RBC/hpf (ref 0–5)

## 2015-11-23 LAB — COMPREHENSIVE METABOLIC PANEL
ALBUMIN: 3.3 g/dL — AB (ref 3.5–5.0)
ALT: 17 U/L (ref 14–54)
AST: 13 U/L — AB (ref 15–41)
Alkaline Phosphatase: 130 U/L — ABNORMAL HIGH (ref 38–126)
Anion gap: 10 (ref 5–15)
BUN: 13 mg/dL (ref 6–20)
CHLORIDE: 99 mmol/L — AB (ref 101–111)
CO2: 26 mmol/L (ref 22–32)
Calcium: 8.9 mg/dL (ref 8.9–10.3)
Creatinine, Ser: 0.56 mg/dL (ref 0.44–1.00)
GFR calc Af Amer: >60 mL/min (ref >60)
GLUCOSE: 407 mg/dL — AB (ref 65–99)
POTASSIUM: 3.9 mmol/L (ref 3.5–5.1)
SODIUM: 135 mmol/L (ref 135–145)
Total Bilirubin: 0.3 mg/dL (ref 0.3–1.2)
Total Protein: 7.6 g/dL (ref 6.5–8.1)

## 2015-11-23 LAB — PREGNANCY, URINE: PREG TEST UR: NEGATIVE

## 2015-11-23 MED ORDER — AZITHROMYCIN 250 MG PO TABS: ORAL_TABLET | ORAL | Status: DC

## 2015-11-23 MED ORDER — HYDROCODONE-ACETAMINOPHEN 5-325 MG PO TABS: 1.0000 | ORAL_TABLET | Freq: Four times a day (QID) | ORAL | Status: DC | PRN

## 2015-11-23 NOTE — Discharge Instructions (Signed)
Zithromax as prescribed.  Hydrocodone as prescribed as needed for pain.  Follow-up with your GYN this week if not improving.   Abdominal Pain, Adult Many things can cause abdominal pain. Usually, abdominal pain is not caused by a disease and will improve without treatment. It can often be observed and treated at home. Your health care provider will do a physical exam and possibly order blood tests and X-rays to help determine the seriousness of your pain. However, in many cases, more time must pass before a clear cause of the pain can be found. Before that point, your health care provider may not know if you need more testing or further treatment. HOME CARE INSTRUCTIONS Monitor your abdominal pain for any changes. The following actions may help to alleviate any discomfort you are experiencing:  Only take over-the-counter or prescription medicines as directed by your health care provider.  Do not take laxatives unless directed to do so by your health care provider.  Try a clear liquid diet (broth, tea, or water) as directed by your health care provider. Slowly move to a bland diet as tolerated. SEEK MEDICAL CARE IF:  You have unexplained abdominal pain.  You have abdominal pain associated with nausea or diarrhea.  You have pain when you urinate or have a bowel movement.  You experience abdominal pain that wakes you in the night.  You have abdominal pain that is worsened or improved by eating food.  You have abdominal pain that is worsened with eating fatty foods.  You have a fever. SEEK IMMEDIATE MEDICAL CARE IF:  Your pain does not go away within 2 hours.  You keep throwing up (vomiting).  Your pain is felt only in portions of the abdomen, such as the right side or the left lower portion of the abdomen.  You pass bloody or black tarry stools. MAKE SURE YOU:  Understand these instructions.  Will watch your condition.  Will get help right away if you are not doing well or  get worse.   This information is not intended to replace advice given to you by your health care provider. Make sure you discuss any questions you have with your health care provider.   Document Released: 09/01/2005 Document Revised: 08/13/2015 Document Reviewed: 08/01/2013 Elsevier Interactive Patient Education 2016 [place].  Otitis Media, Adult Otitis media is redness, soreness, and inflammation of the middle ear. Otitis media may be caused by allergies or, most commonly, by infection. Often it occurs as a complication of the common cold. SIGNS AND SYMPTOMS Symptoms of otitis media may include:  Earache.  Fever.  Ringing in your ear.  Headache.  Leakage of fluid from the ear. DIAGNOSIS To diagnose otitis media, your health care provider will examine your ear with an otoscope. This is an instrument that allows your health care provider to see into your ear in order to examine your eardrum. Your health care provider also will ask you questions about your symptoms. TREATMENT  Typically, otitis media resolves on its own within 3-5 days. Your health care provider may prescribe medicine to ease your symptoms of pain. If otitis media does not resolve within 5 days or is recurrent, your health care provider may prescribe antibiotic medicines if he or she suspects that a bacterial infection is the cause. HOME CARE INSTRUCTIONS   If you were prescribed an antibiotic medicine, finish it all even if you start to feel better.  Take medicines only as directed by your health care provider.  Keep all  follow-up visits as directed by your health care provider. SEEK MEDICAL CARE IF:  You have otitis media only in one ear, or bleeding from your nose, or both.  You notice a lump on your neck.  You are not getting better in 3-5 days.  You feel worse instead of better. SEEK IMMEDIATE MEDICAL CARE IF:   You have pain that is not controlled with medicine.  You have swelling, redness, or  pain around your ear or stiffness in your neck.  You notice that part of your face is paralyzed.  You notice that the bone behind your ear (mastoid) is tender when you touch it. MAKE SURE YOU:   Understand these instructions.  Will watch your condition.  Will get help right away if you are not doing well or get worse.   This information is not intended to replace advice given to you by your health care provider. Make sure you discuss any questions you have with your health care provider.   Document Released: 08/27/2004 Document Revised: 12/13/2014 Document Reviewed: 06/19/2013 Elsevier Interactive Patient Education [employer].

## 2015-11-23 NOTE — ED Notes (Signed)
Per EMS pt coming from home with c.o left pelvic pain, she called her OB GYN who advised her to go [place], however due to high CBG (532) EMS was diverted here.

## 2015-11-23 NOTE — ED Provider Notes (Signed)
CSN: [id number]     Arrival date & time 11/23/15  1227 History   First MD Initiated Contact with Patient 11/23/15 1340     Chief Complaint  Patient presents with  . Pelvic Pain  . Hyperglycemia     (Consider location/radiation/quality/duration/timing/severity/associated sxs/prior Treatment) HPI Comments: Patient is a 39 year old female with history of type 1 diabetes, morbid obesity, COPD, bipolar. She presents for evaluation of abdominal discomfort. She reports pain in her right flank and right lower abdomen which is been present for several weeks. She was evaluated by her GYN sometime around Thanksgiving, however nothing was found. The pain went away, but has since returned and is worse. She denies any fevers or chills. She denies any nausea, vomiting, or diarrhea. She denies any vaginal bleeding or discharge. Her last menstrual period was last week and was normal.  Patient is a 39 y.o. female presenting with pelvic pain and hyperglycemia. The history is provided by the patient.  Pelvic Pain This is a new problem. Episode onset: 1 month ago. The problem occurs constantly. The problem has been gradually worsening. Associated symptoms include abdominal pain. Nothing aggravates the symptoms. Nothing relieves the symptoms. She has tried nothing for the symptoms. The treatment provided no relief.  Hyperglycemia Associated symptoms: abdominal pain     Past Medical History  Diagnosis Date  . Morbid obesity ([place])   . Alveolar hypoventilation   . Asthma   . GERD (gastroesophageal reflux disease)   . Rectal fissure   . Hypertension   . Costochondritis   . Cellulitis 08/2010-08/2011  . Depression   . Bipolar 2 disorder ([place])   . Obstructive sleep apnea   . Chest pain     NO CAD by cath [id number], nl LV systolic fxn  . Diabetes mellitus 2000    Type 2, Uncontrolled  . Arthritis   . HLD (hyperlipidemia)   . Anemia   . COPD (chronic obstructive pulmonary disease) ([place])   . Varicose veins      Right medial thigh and Left leg    Past Surgical History  Procedure Laterality Date  . Carpal tunnel release    . Bil knee surgery    . Mass excision N/A 06/29/2013    Procedure:  WIDE LOCAL EXCISION OF POSTERIOR NECK ABSCESS;  Surgeon: Ralene Ok, MD;  Location: [place];  Service: General;  Laterality: N/A;  . Cardiac catheterization    . Left heart catheterization with coronary angiogram N/A 07/27/2012    Procedure: LEFT HEART CATHETERIZATION WITH CORONARY ANGIOGRAM;  Surgeon: Sherren Mocha, MD;  Location: [facility] C[facility] LAB;  Service: Cardiovascular;  Laterality: N/A;   Family History  Problem Relation Age of Onset  . Diabetes Mother   . Hyperlipidemia Mother   . Depression Mother   . Varicose Veins Mother   . Heart attack Paternal Uncle   . Heart disease Paternal Grandmother   . Heart attack Paternal Grandmother   . Heart attack Paternal Grandfather   . Heart disease Paternal Grandfather   . Heart attack Father    Social History  Substance Use Topics  . Smoking status: Former Smoker -- 0.30 packs/day for .3 years    Types: Cigarettes    Quit date: 12/06/1993  . Smokeless tobacco: Never Used  . Alcohol Use: No   OB History    Gravida Para Term Preterm AB TAB SAB Ectopic Multiple Living   2 1   1  1   1      Review of Systems  Gastrointestinal: Positive for abdominal pain.  Genitourinary: Positive for pelvic pain.  All other systems reviewed and are negative.     Allergies  Kiwi extract and Nubain  Home Medications   Prior to Admission medications   Medication Sig Start Date End Date Taking? Authorizing Provider  atorvastatin (LIPITOR) 20 MG tablet Take 20 mg by mouth daily.    Historical Provider, MD  carvedilol (COREG) 12.5 MG tablet TAKE 1 TABLET BY MOUTH TWICE DAILY WITH A MEAL 08/12/15   [name], MD  cephALEXin (KEFLEX) 500 MG capsule Take 1 capsule (500 mg total) by mouth 3 (three) times daily. Take all of medicine and drink lots of fluids  11/12/15   Billy Fischer, MD  cetirizine (ZYRTEC) 10 MG tablet TAKE 1 TABLET BY MOUTH DAILY 07/16/15   [name], MD  Cholecalciferol 50000 UNITS TABS Take 1 tablet by mouth once a week. For 12 weeks 10/29/15   [name], MD  clotrimazole (LOTRIMIN) 1 % cream APPLY TOPICALLY 2 TIMES A DAY AS NEEDED FOR SKIN YEAST 07/16/15   [name], MD  cyclobenzaprine (FLEXERIL) 10 MG tablet TAKE 1 TABLET BY MOUTH TWICE DAILY AS NEEDED FOR MUSCLE SPASMS. 11/13/15   [name], MD  Elastic Bandages & Supports (WRIST/THUMB SPLINT/LEFT UNIV) MISC Wear daily. Dispense thumb spica splint. Patient may choose size. 10/28/15   [name], MD  Elastic Bandages & Supports (WRIST/THUMB SPLINT/RIGHT UNIV) MISC Wear daily. Dispense thumb spica splint. Patient may choose size. 10/28/15   [name], MD  fluconazole (DIFLUCAN) 150 MG tablet Take 1 tablet (150 mg total) by mouth once. May repeat in 3 days if still having symptoms. 09/15/15   [name], MD  fluticasone (FLONASE) 50 MCG/ACT nasal spray Place 2 sprays into both nostrils daily. 05/21/14   Melony Overly, MD  gabapentin (NEURONTIN) 300 MG capsule TAKE 3 CAPSULES BY MOUTH EVERY MORNING AND AFTERNOON AND TAKE 4 CAPSULES BY MOUTH EVERY EVENING. 10/21/15   [name], MD  HYDROcodone-acetaminophen (NORCO/VICODIN) 5-325 MG tablet Take 1 tablet by mouth every 6 (six) hours as needed. For pain 11/12/15   Billy Fischer, MD  hydrocortisone cream 0.5 % Apply 1 application topically 2 (two) times daily as needed for itching. To feet 02/26/15   [name], MD  ibuprofen (ADVIL,MOTRIN) 800 MG tablet Take 1 tablet (800 mg total) by mouth 3 (three) times daily with meals. 12/29/14   [name], PA-C  Insulin Glargine (LANTUS SOLOSTAR) 100 UNIT/ML Solostar Pen Inject 600 Units into the skin every morning. And pen needles 4/day 08/05/15   [name], MD  lisinopril (PRINIVIL,ZESTRIL) 10 MG tablet Take 1  tablet (10 mg total) by mouth daily. 03/12/14   [name], MD  neomycin-polymyxin-hydrocortisone (CORTISPORIN) 3.5-10000-1 otic suspension Place 4 drops into the right ear 3 (three) times daily. 11/12/15   Billy Fischer, MD  nitroGLYCERIN (NITROSTAT) 0.4 MG SL tablet DISSOLVE 1 TABLET UNDER THE TONGUE EVERY 5 MINUTES AS NEEDED FOR CHEST PAIN. 10/21/15   [name], MD  pantoprazole (PROTONIX) 40 MG tablet TAKE 1 TABLET BY MOUTH EVERY DAY 09/15/15   [name], MD  pramipexole (MIRAPEX) 0.125 MG tablet TAKE 3 TABLETS BY MOUTH EVERY NIGHT AT BEDTIME 11/13/15   [name], MD  PROCTOSOL HC 2.5 % rectal cream APPLY TO RECTAL AREA TWICE DAILY 06/10/15   Alveda Reasons, MD  PROVENTIL HFA 108 (90 BASE) MCG/ACT inhaler  INHALE 1 PUFF BY MOUTH EVERY 6 HOURS AS NEEDED FOR WHEEZING OR SHORTNESS OF BREATH. 07/16/15   [name], MD  SYMBICORT 160-4.5 MCG/ACT inhaler INHALE 2 PUFFS INTO LUNGS TWICE A DAY 08/12/15   [name], MD  topiramate (TOPAMAX) 25 MG tablet TAKE 1 TABLET BY MOUTH EVERY NIGHT AT BEDTIME 09/15/15   [name], MD  traMADol (ULTRAM) 50 MG tablet TAKE 1 TABLET BY MOUTH EVERY DAY AS NEEDED FOR MODERATE PAIN **MUST LAST 30 DAYS** 09/15/15   [name], MD  VOLTAREN 1 % GEL APPLY 2 GRAMS TOPICALLY TO AFFECTED AREA TWICE DAILY 06/10/15   Alveda Reasons, MD   BP 109/82 mmHg  Pulse 90  Temp(Src) 98.2 F (36.8 C) (Oral)  Resp 14  SpO2 99%  LMP 10/23/2015 Physical Exam  Constitutional: She is oriented to person, place, and time. She appears well-developed and well-nourished. No distress.  HENT:  Head: Normocephalic and atraumatic.  Neck: Normal range of motion. Neck supple.  Cardiovascular: Normal rate and regular rhythm.  Exam reveals no gallop and no friction rub.   No murmur heard. Pulmonary/Chest: Effort normal and breath sounds normal. No respiratory distress. She has no wheezes.  Abdominal: Soft. Bowel sounds are normal.  She exhibits distension. There is no tenderness.  The patient has a large pannus. She is tender to palpation to the right lower pannus and right lower quadrant. There is no obvious abnormality noted.  Musculoskeletal: Normal range of motion.  Neurological: She is alert and oriented to person, place, and time.  Skin: Skin is warm and dry. She is not diaphoretic.  Nursing note and vitals reviewed.   ED Course  Procedures (including critical care time) Labs Review Labs Reviewed  CBC WITH DIFFERENTIAL/PLATELET  COMPREHENSIVE METABOLIC PANEL  URINALYSIS, ROUTINE W REFLEX MICROSCOPIC (NOT AT [facility])  PREGNANCY, URINE  CBG MONITORING, ED    Imaging Review No results found. I have personally reviewed and evaluated these images and lab results as part of my medical decision-making.   EKG Interpretation None      MDM   Final diagnoses:  Abdominal pain    Patient presents with complaints of flank pain radiating into the right groin. Her urinalysis is clear, there is no white count, and CT scan fails to reveal a kidney stone or other pathology. I'm uncertain as to the exact etiology of her discomfort, however nothing appears emergent. Her symptoms will be treated she will be discharged and instructed to follow-up with her primary doctor.  She also asked me to look at her ear prior to discharge. She does appear to have an otitis media of the right ear. There is bulging, redness of the TM.    Veryl Speak, MD 11/23/15 [phone]

## 2015-11-24 ENCOUNTER — Emergency Department (HOSPITAL_COMMUNITY)
Admission: EM | Admit: 2015-11-24 | Discharge: 2015-11-24 | Disposition: A | Discharge status: Home / Self Care | Payer: Medicaid Other | Attending: Emergency Medicine | Admitting: Emergency Medicine

## 2015-11-24 ENCOUNTER — Encounter (HOSPITAL_COMMUNITY): Payer: Self-pay | Admitting: Emergency Medicine

## 2015-11-24 DIAGNOSIS — E119 Type 2 diabetes mellitus without complications: Secondary | ICD-10-CM | POA: Insufficient documentation

## 2015-11-24 DIAGNOSIS — Z7951 Long term (current) use of inhaled steroids: Secondary | ICD-10-CM | POA: Diagnosis not present

## 2015-11-24 DIAGNOSIS — E785 Hyperlipidemia, unspecified: Secondary | ICD-10-CM | POA: Insufficient documentation

## 2015-11-24 DIAGNOSIS — J449 Chronic obstructive pulmonary disease, unspecified: Secondary | ICD-10-CM | POA: Diagnosis not present

## 2015-11-24 DIAGNOSIS — F3181 Bipolar II disorder: Secondary | ICD-10-CM | POA: Insufficient documentation

## 2015-11-24 DIAGNOSIS — H9201 Otalgia, right ear: Secondary | ICD-10-CM | POA: Diagnosis present

## 2015-11-24 DIAGNOSIS — H7291 Unspecified perforation of tympanic membrane, right ear: Secondary | ICD-10-CM | POA: Diagnosis not present

## 2015-11-24 DIAGNOSIS — Z79899 Other long term (current) drug therapy: Secondary | ICD-10-CM | POA: Insufficient documentation

## 2015-11-24 DIAGNOSIS — M1990 Unspecified osteoarthritis, unspecified site: Secondary | ICD-10-CM | POA: Insufficient documentation

## 2015-11-24 DIAGNOSIS — Z8669 Personal history of other diseases of the nervous system and sense organs: Secondary | ICD-10-CM | POA: Insufficient documentation

## 2015-11-24 DIAGNOSIS — K219 Gastro-esophageal reflux disease without esophagitis: Secondary | ICD-10-CM | POA: Diagnosis not present

## 2015-11-24 DIAGNOSIS — Z862 Personal history of diseases of the blood and blood-forming organs and certain disorders involving the immune mechanism: Secondary | ICD-10-CM | POA: Diagnosis not present

## 2015-11-24 DIAGNOSIS — Z87891 Personal history of nicotine dependence: Secondary | ICD-10-CM | POA: Diagnosis not present

## 2015-11-24 DIAGNOSIS — Z872 Personal history of diseases of the skin and subcutaneous tissue: Secondary | ICD-10-CM | POA: Diagnosis not present

## 2015-11-24 DIAGNOSIS — I10 Essential (primary) hypertension: Secondary | ICD-10-CM | POA: Insufficient documentation

## 2015-11-24 DIAGNOSIS — J45909 Unspecified asthma, uncomplicated: Secondary | ICD-10-CM | POA: Insufficient documentation

## 2015-11-24 HISTORY — DX: Malingerer (conscious simulation): Z76.5

## 2015-11-24 HISTORY — DX: Other chronic pain: G89.29

## 2015-11-24 LAB — CBG MONITORING, ED: Glucose-Capillary: 410 mg/dL — ABNORMAL HIGH (ref 65–99)

## 2015-11-24 MED ORDER — HYDROCODONE-ACETAMINOPHEN 5-325 MG PO TABS
1.0000 | ORAL_TABLET | Freq: Once | ORAL | Status: AC
Start: 2015-11-24 — End: 2015-11-24
  Administered 2015-11-24: 1 via ORAL
  Filled 2015-11-24: qty 1

## 2015-11-24 MED ORDER — AMOXICILLIN-POT CLAVULANATE 875-125 MG PO TABS
1.0000 | ORAL_TABLET | Freq: Two times a day (BID) | ORAL | Status: DC
Start: 2015-11-24 — End: 2015-11-27

## 2015-11-24 MED ORDER — AMOXICILLIN-POT CLAVULANATE 875-125 MG PO TABS
1.0000 | ORAL_TABLET | Freq: Once | ORAL | Status: AC
  Administered 2015-11-24: 1 via ORAL
  Filled 2015-11-24: qty 1

## 2015-11-24 NOTE — Discharge Instructions (Signed)
Eardrum Perforation The eardrum is a thin, round tissue inside the ear. It allows you to hear. The eardrum can get torn (perforated). Eardrums often heal on their own. There is often little or no long-term hearing loss. HOME CARE  Keep your ear dry while it heals. Do not let your head go under water. Do not swim or dive until your doctor says it is okay.  Before you take a bath or shower, do one of these things to keep water out of your ear:  Put a waterproof earplug in your ear.  Put petroleum jelly all over a cotton ball. Put the cotton ball in your ear.  Take medicines only as told by your doctor.  Avoid blowing your nose if you can. If you blow your nose, do it gently.  Continue your normal activities after your eardrum heals. Your doctor will tell you when your eardrum has healed.  Talk to your doctor before you fly on an airplane.  Keep all doctor follow-up visits as told by your doctor. This is important. GET HELP IF:  You have a fever. GET HELP RIGHT AWAY IF:  You have blood or yellowish-white fluid (pus) coming from your ear.  You feel dizzy or off balance.  You feel sick to your stomach (nauseous), or you throw up (vomit).  You have more pain.   This information is not intended to replace advice given to you by your health care provider. Make sure you discuss any questions you have with your health care provider.   Document Released: 05/12/2010 Document Revised: 12/13/2014 Document Reviewed: 07/01/2014 Elsevier Interactive Patient Education [employer].

## 2015-11-24 NOTE — ED Provider Notes (Signed)
CSN: [id number]     Arrival date & time 11/24/15  2224 History  By signing my name below, I, [name], attest that this documentation has been prepared under the direction and in the presence of Glendell Docker, NP Electronically Signed: Soijett Hall, ED Scribe. 11/24/2015. 10:33 PM.   Chief Complaint  Patient presents with  . Otalgia      The history is provided by the patient. No language interpreter was used.    [name] is a 39 y.o. female with a medical hx of HTN and DM who presents to the Emergency Department via EMS complaining of constant, moderate, right ear pain onset 20 minutes ago PTA. She reports that she has had an ear infection x 2-3 weeks and she is unsure of the type of abx and ear drops that she received. She states that she completed her abx treatment and that she has still been using the ear drops. She also notes that she was seen on 11/23/2015 and was informed that there was till fluid on her ear and was informed to complete her Rx for. She notes that she has tried Rx ear drops for the relief of her symptoms. She denies ear discharge and any other symptoms. Denies allergies to medications at this time.     Past Medical History  Diagnosis Date  . Morbid obesity ([place])   . Alveolar hypoventilation   . Asthma   . GERD (gastroesophageal reflux disease)   . Rectal fissure   . Hypertension   . Costochondritis   . Cellulitis 08/2010-08/2011  . Depression   . Bipolar 2 disorder ([name])   . Obstructive sleep apnea   . Chest pain     NO CAD by cath [id number], nl LV systolic fxn  . Diabetes mellitus 2000    Type 2, Uncontrolled  . Arthritis   . HLD (hyperlipidemia)   . Anemia   . COPD (chronic obstructive pulmonary disease) ([name])   . Varicose veins     Right medial thigh and Left leg    Past Surgical History  Procedure Laterality Date  . Carpal tunnel release    . Bil knee surgery    . Mass excision N/A 06/29/2013    Procedure:  WIDE LOCAL EXCISION OF  POSTERIOR NECK ABSCESS;  Surgeon: Ralene Ok, MD;  Location: [place];  Service: General;  Laterality: N/A;  . Cardiac catheterization    . Left heart catheterization with coronary angiogram N/A 07/27/2012    Procedure: LEFT HEART CATHETERIZATION WITH CORONARY ANGIOGRAM;  Surgeon: Sherren Mocha, MD;  Location: [facility] CATH LAB;  Service: Cardiovascular;  Laterality: N/A;   Family History  Problem Relation Age of Onset  . Diabetes Mother   . Hyperlipidemia Mother   . Depression Mother   . Varicose Veins Mother   . Heart attack Paternal Uncle   . Heart disease Paternal Grandmother   . Heart attack Paternal Grandmother   . Heart attack Paternal Grandfather   . Heart disease Paternal Grandfather   . Heart attack Father    Social History  Substance Use Topics  . Smoking status: Former Smoker -- 0.30 packs/day for .3 years    Types: Cigarettes    Quit date: 12/06/1993  . Smokeless tobacco: Never Used  . Alcohol Use: No   OB History    Gravida Para Term Preterm AB TAB SAB Ectopic Multiple Living   2 1   1  1   1      Review of Systems  HENT: Positive for ear pain. Negative for ear discharge.   All other systems reviewed and are negative.    Allergies  Kiwi extract and Nubain  Home Medications   Prior to Admission medications   Medication Sig Start Date End Date Taking? Authorizing Provider  atorvastatin (LIPITOR) 20 MG tablet Take 20 mg by mouth daily.    Historical Provider, MD  azithromycin (ZITHROMAX Z-PAK) 250 MG tablet 2 po day one, then 1 daily x 4 days 11/23/15   Veryl Speak, MD  carvedilol (COREG) 12.5 MG tablet TAKE 1 TABLET BY MOUTH TWICE DAILY WITH A MEAL 08/12/15   [name], MD  cephALEXin (KEFLEX) 500 MG capsule Take 1 capsule (500 mg total) by mouth 3 (three) times daily. Take all of medicine and drink lots of fluids Patient not taking: Reported on 11/23/2015 11/12/15   Billy Fischer, MD  cetirizine (ZYRTEC) 10 MG tablet TAKE 1 TABLET BY MOUTH DAILY  07/16/15   [name], MD  Cholecalciferol 50000 UNITS TABS Take 1 tablet by mouth once a week. For 12 weeks Patient taking differently: Take 1 tablet by mouth once a week. For 12 weeks Wednesday 10/29/15   [name], MD  clotrimazole (LOTRIMIN) 1 % cream APPLY TOPICALLY 2 TIMES A DAY AS NEEDED FOR SKIN YEAST Patient not taking: Reported on 11/23/2015 07/16/15   [name], MD  cyclobenzaprine (FLEXERIL) 10 MG tablet TAKE 1 TABLET BY MOUTH TWICE DAILY AS NEEDED FOR MUSCLE SPASMS. 11/13/15   [name], MD  fluconazole (DIFLUCAN) 150 MG tablet Take 1 tablet (150 mg total) by mouth once. May repeat in 3 days if still having symptoms. Patient not taking: Reported on 11/23/2015 09/15/15   [name], MD  fluticasone [facility]) 50 MCG/ACT nasal spray Place 2 sprays into both nostrils daily. 05/21/14   Melony Overly, MD  gabapentin (NEURONTIN) 300 MG capsule TAKE 3 CAPSULES BY MOUTH EVERY MORNING AND AFTERNOON AND TAKE 4 CAPSULES BY MOUTH EVERY EVENING. 10/21/15   [name], MD  HYDROcodone-acetaminophen (NORCO) 5-325 MG tablet Take 1-2 tablets by mouth every 6 (six) hours as needed. 11/23/15   Veryl Speak, MD  hydrocortisone cream 0.5 % Apply 1 application topically 2 (two) times daily as needed for itching. To feet Patient not taking: Reported on 11/23/2015 02/26/15   [name], MD  ibuprofen (ADVIL,MOTRIN) 800 MG tablet Take 1 tablet (800 mg total) by mouth 3 (three) times daily with meals. Patient not taking: Reported on 11/23/2015 12/29/14   [name], PA-C  Insulin Glargine (LANTUS SOLOSTAR) 100 UNIT/ML Solostar Pen Inject 600 Units into the skin every morning. And pen needles 4/day 08/05/15   [name], MD  lisinopril (PRINIVIL,ZESTRIL) 10 MG tablet Take 1 tablet (10 mg total) by mouth daily. 03/12/14   [name], MD  neomycin-polymyxin-hydrocortisone (CORTISPORIN) 3.5-10000-1 otic suspension Place 4 drops into the right ear 3  (three) times daily. Patient not taking: Reported on 11/23/2015 11/12/15   Billy Fischer, MD  nitroGLYCERIN (NITROSTAT) 0.4 MG SL tablet DISSOLVE 1 TABLET UNDER THE TONGUE EVERY 5 MINUTES AS NEEDED FOR CHEST PAIN. 10/21/15   [name], MD  pantoprazole (PROTONIX) 40 MG tablet TAKE 1 TABLET BY MOUTH EVERY DAY 09/15/15   [name], MD  pramipexole (MIRAPEX) 0.125 MG tablet TAKE 3 TABLETS BY MOUTH EVERY NIGHT AT BEDTIME 11/13/15   [name], MD  PROCTOSOL HC 2.5 % rectal cream APPLY TO RECTAL AREA  TWICE DAILY Patient not taking: Reported on 11/23/2015 06/10/15   Alveda Reasons, MD  PROVENTIL HFA 108 (90 BASE) MCG/ACT inhaler INHALE 1 PUFF BY MOUTH EVERY 6 HOURS AS NEEDED FOR WHEEZING OR SHORTNESS OF BREATH. 07/16/15   [name], MD  SYMBICORT 160-4.5 MCG/ACT inhaler INHALE 2 PUFFS INTO LUNGS TWICE A DAY 08/12/15   [name], MD  topiramate (TOPAMAX) 25 MG tablet TAKE 1 TABLET BY MOUTH EVERY NIGHT AT BEDTIME 09/15/15   [name], MD  traMADol (ULTRAM) 50 MG tablet TAKE 1 TABLET BY MOUTH EVERY DAY AS NEEDED FOR MODERATE PAIN **MUST LAST 30 DAYS** 09/15/15   [name], MD  VOLTAREN 1 % GEL APPLY 2 GRAMS TOPICALLY TO AFFECTED AREA TWICE DAILY Patient not taking: Reported on 11/23/2015 06/10/15   Alveda Reasons, MD   BP 172/120 mmHg  Pulse 113  Temp(Src) 98 F (36.7 C) (Oral)  Resp 20  SpO2 99%  LMP 10/23/2015 Physical Exam  Constitutional: She is oriented to person, place, and time. She appears well-developed and well-nourished. No distress.  HENT:  Head: Normocephalic and atraumatic.  Left Ear: Tympanic membrane, external ear and ear canal normal.  Mouth/Throat: Uvula is midline, oropharynx is clear and moist and mucous membranes are normal.  Ruptured TM on right ear. Left TM nl.   Eyes: EOM are normal.  Neck: Neck supple.  Cardiovascular: Normal rate.   Pulmonary/Chest: Effort normal. No respiratory distress.   Musculoskeletal: Normal range of motion.  Neurological: She is alert and oriented to person, place, and time.  Skin: Skin is warm and dry.  Psychiatric: She has a normal mood and affect. Her behavior is normal.  Nursing note and vitals reviewed.   ED Course  Procedures (including critical care time) DIAGNOSTIC STUDIES:   COORDINATION OF CARE: 10:32 PM Discussed treatment plan with pt at bedside which includes referral to ENT specialist and pt agreed to plan.    Labs Review Labs Reviewed - No data to display  Imaging Review    EKG Interpretation None      MDM   Final diagnoses:  Ruptured ear drum, right   Pt sent home with augmentin. Pt has drop at home. Discussed follow with ent   I personally performed the services described in this documentation, which was scribed in my presence. The recorded information has been reviewed and is accurate.    Glendell Docker, NP 11/24/15 [id number]  Lajean Saver, MD 11/25/15 1520

## 2015-11-24 NOTE — ED Notes (Signed)
Pt in reporting R earache started 30 min ago, per pt has been battling ear infection for past 2-3 weeks. Felt a "popping" sensation

## 2015-11-24 NOTE — ED Notes (Signed)
See EDP assessment 

## 2015-11-26 ENCOUNTER — Telehealth: Payer: Self-pay | Admitting: Family Medicine

## 2015-11-26 DIAGNOSIS — H7291 Unspecified perforation of tympanic membrane, right ear: Secondary | ICD-10-CM

## 2015-11-26 NOTE — Telephone Encounter (Signed)
Referral entered Demont Linford J Nathanael Krist, MD  

## 2015-11-26 NOTE — Telephone Encounter (Signed)
See ED notes.

## 2015-11-26 NOTE — Telephone Encounter (Signed)
Pt has a ruptured ear drum.  Pt needs a referral to [facility] 336 [phone]

## 2015-11-27 ENCOUNTER — Ambulatory Visit (INDEPENDENT_AMBULATORY_CARE_PROVIDER_SITE_OTHER): Payer: Medicaid Other | Admitting: Family Medicine

## 2015-11-27 ENCOUNTER — Encounter: Payer: Self-pay | Admitting: Family Medicine

## 2015-11-27 ENCOUNTER — Telehealth: Payer: Self-pay | Admitting: *Deleted

## 2015-11-27 VITALS — BP 128/89 | HR 102 | Temp 97.6°F | Ht 62.0 in | Wt 302.6 lb

## 2015-11-27 DIAGNOSIS — M79641 Pain in right hand: Secondary | ICD-10-CM | POA: Diagnosis not present

## 2015-11-27 DIAGNOSIS — Z7189 Other specified counseling: Secondary | ICD-10-CM

## 2015-11-27 DIAGNOSIS — I10 Essential (primary) hypertension: Secondary | ICD-10-CM | POA: Diagnosis not present

## 2015-11-27 DIAGNOSIS — H7291 Unspecified perforation of tympanic membrane, right ear: Secondary | ICD-10-CM | POA: Diagnosis not present

## 2015-11-27 DIAGNOSIS — G8929 Other chronic pain: Secondary | ICD-10-CM

## 2015-11-27 DIAGNOSIS — L089 Local infection of the skin and subcutaneous tissue, unspecified: Secondary | ICD-10-CM | POA: Diagnosis not present

## 2015-11-27 DIAGNOSIS — R51 Headache: Secondary | ICD-10-CM | POA: Diagnosis not present

## 2015-11-27 DIAGNOSIS — M654 Radial styloid tenosynovitis [de Quervain]: Secondary | ICD-10-CM

## 2015-11-27 DIAGNOSIS — G2581 Restless legs syndrome: Secondary | ICD-10-CM | POA: Diagnosis not present

## 2015-11-27 DIAGNOSIS — M79642 Pain in left hand: Secondary | ICD-10-CM

## 2015-11-27 MED ORDER — CIPROFLOXACIN-DEXAMETHASONE 0.3-0.1 % OT SUSP: 4.0000 [drp] | Freq: Two times a day (BID) | OTIC | Status: DC

## 2015-11-27 MED ORDER — OFLOXACIN 0.3 % OT SOLN: 5.0000 [drp] | Freq: Two times a day (BID) | OTIC | Status: DC

## 2015-11-27 MED ORDER — AMOXICILLIN-POT CLAVULANATE 875-125 MG PO TABS: 1.0000 | ORAL_TABLET | Freq: Two times a day (BID) | ORAL | Status: DC

## 2015-11-27 MED ORDER — HYDROCODONE-ACETAMINOPHEN 10-325 MG PO TABS: 1.0000 | ORAL_TABLET | Freq: Four times a day (QID) | ORAL | Status: DC | PRN

## 2015-11-27 NOTE — Telephone Encounter (Signed)
Pt informed. [name] Dawn  

## 2015-11-27 NOTE — Telephone Encounter (Signed)
Please call patient to inform of new medication please.  Derl Barrow, RN

## 2015-11-27 NOTE — Telephone Encounter (Signed)
Prior Authorization received from [employer] for [employer]. Formulary and PA form placed in provider box for completion. Derl Barrow, RN

## 2015-11-27 NOTE — Telephone Encounter (Signed)
Reviewed medicaid formulary. Okay to switch to ciprodex which is on the formulary. New rx sent in. Tamika, can you call patient and let her know?  Thanks, [name], MD

## 2015-11-27 NOTE — Patient Instructions (Signed)
Referring to hand surgeon Use ear drops and augmentin I prescribed for your ear Short term increase in quantity of pain pills (1 month) See me in 1 month  Be well, Dr. [name]   Tympanic Membrane Perforation The eardrum (tympanic membrane) protects the inner ear from the outside environment. In addition to protection, the eardrum allows you to hear by transmitting sound waves to the bones in your ear and then to the nervous system. The tympanic membrane is easily perforated, which may result in damage to the inner ear. SYMPTOMS   Sometimes there are no symptoms.  Decreased hearing.  Fluid drainage from ear.  Ear pain. CAUSES   Most commonly, a middle ear infection from built-up pressure.  Injury from a cotton swab.  Traumatic injury to the side of the head. RISK INCREASES WITH:  Frequent middle ear infections.  Use of cotton swabs. PREVENTION   Do not use cotton swabs to clean the ear canal.  If you have ear pain or pressure, see your caregiver to rule out an ear infection that needs treatment. TREATMENT  Protecting the inner ear and allowing the membrane to heal on its own is how tympanic membrane rupture is usually treated. Healing may take several weeks. In order to protect the inner ear, do not allow any fluid to enter the ear canal. Avoid being submerged in water. The use of ear drops may prevent an ear infection from developing, but they should be used with caution, as ear drops can also cause damage to the inner ear. It is important to follow up with your caregiver to confirm healing of the tympanic membrane. If the membrane does not heal, permanent hearing loss may occur. To avoid serious complications, tympanic membranes that do not heal on their own are repaired with surgery.   This information is not intended to replace advice given to you by your health care provider. Make sure you discuss any questions you have with your health care provider.   Document  Released: 11/22/2005 Document Revised: 02/14/2012 Document Reviewed: 06/04/2015 Elsevier Interactive Patient Education [employer].

## 2015-11-28 DIAGNOSIS — H7290 Unspecified perforation of tympanic membrane, unspecified ear: Secondary | ICD-10-CM | POA: Insufficient documentation

## 2015-11-28 NOTE — Progress Notes (Signed)
Date of Visit: 11/27/2015   HPI:  Ruptured tympanic membrane: seen in ED on 12/7 for otitis externa, rx'd neomycin ear drops. Then seen on 12/19 for continued pain and dx'd with ruptured tympanic membrane on R. Has not use neomycin drops since that time. Was given rx for augmentin but husband lost the printed rx. Having lots of pain in R ear. No fever. I referred her to ENT but no appts are available until Jan 6, has one scheduled this day. Having trouble hearing out of R ear  Chronic pain - needs refill. Wants to increase to 4 tabs per day due to acute pain in R ear. Chronic pain is otherwise stable.  Hand issues: has been wearing thumb spica splints at night for about a month but still having lots of pain. Prior history of carpal tunnel release bilaterally  ROS: See HPI.  [place]: history of chronic pain, obesity, poorly controlled diabetes, gerd, hypertension   PHYSICAL EXAM: BP 128/89 mmHg  Pulse 102  Temp(Src) 97.6 F (36.4 C) (Oral)  Ht 5\' 2"  (1.575 m)  Wt 302 lb 9.6 oz (137.258 kg)  BMI 55.33 kg/m2  LMP 10/23/2015 Gen: NAD, pleasant, cooperative. Appears in pain from R ear HEENT: normocephalic, atraumatic. R ear tender to movement of pinna. Canal mildly erythematous. Tympanic membrane appears intact but unable to visualize entire surface due to discomfort with ear speculum. No obvious drainage. Neuro: alert, grossly nf, speech normal Extremities: pain in bilateral hands over thenar eminence. Grip 5/5 bilateral. No thenar atrophy  ASSESSMENT/PLAN:  De Quervain's tenosynovitis, bilateral Not improved with thumb spica splints. Refer to hand surgery.  Encounter for chronic pain management Short term increase in rx to #120 pills per month due to acute pain in R ear. Follow up in 1 month.   Ruptured tympanic membrane No signs of systemic infection. Lots of ear pain - ciprodex drops (advised NOT to use neomycin containing medication with ruptured tympanic membrane) - new rx given  for augmentin - pain medications as above - keep appointment with ENT on Jan 6   FOLLOW UP: F/u in 1 month for chronic pain Keep appointment with ENT Refer to hand surgery  [name], [place]

## 2015-11-28 NOTE — Assessment & Plan Note (Signed)
Short term increase in rx to #120 pills per month due to acute pain in R ear. Follow up in 1 month.

## 2015-11-28 NOTE — Assessment & Plan Note (Signed)
No signs of systemic infection. Lots of ear pain - ciprodex drops (advised NOT to use neomycin containing medication with ruptured tympanic membrane) - new rx given for augmentin - pain medications as above - keep appointment with ENT on Jan 6

## 2015-11-28 NOTE — Assessment & Plan Note (Signed)
Not improved with thumb spica splints. Refer to hand surgery.

## 2015-12-02 IMAGING — CR DG CHEST 1V PORT
1 series · 1 of 1 positions shown · non-contrast
Comparison: 03/13/2014

CLINICAL DATA: Left upper chest pain

EXAM:
PORTABLE CHEST - 1 VIEW

[AP]
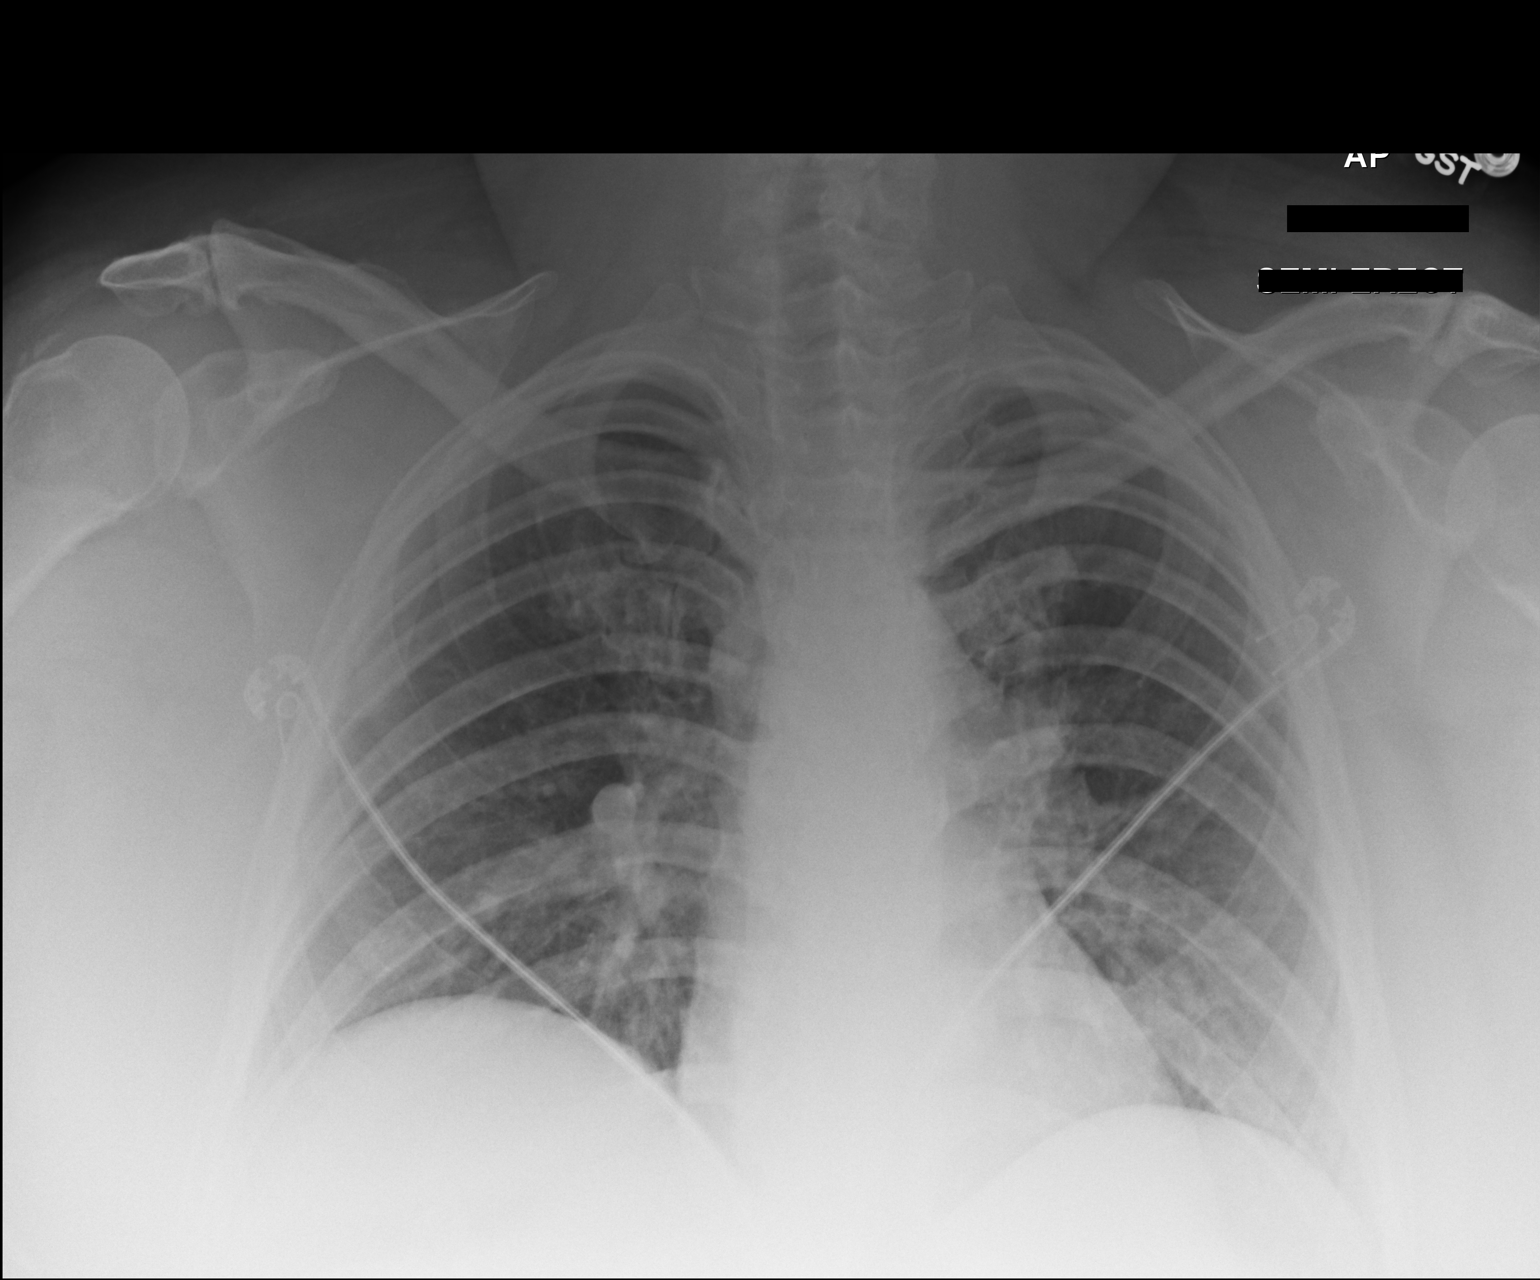

[1 of 1 positions shown; findings below may reference images not displayed]

FINDINGS: Cardiac shadow is within normal limits. The lungs are well aerated
bilaterally with some mild increased density in the left lung base
consistent with atelectasis and/or early infiltrate. The bony
structures are within normal limits.
IMPRESSION: Increased density in the left base likely representing atelectasis
and/or early infiltrate.

## 2015-12-09 ENCOUNTER — Ambulatory Visit (INDEPENDENT_AMBULATORY_CARE_PROVIDER_SITE_OTHER): Payer: Medicaid Other | Admitting: Endocrinology

## 2015-12-09 ENCOUNTER — Encounter: Payer: Self-pay | Admitting: Endocrinology

## 2015-12-09 VITALS — BP 136/92 | HR 91 | Temp 97.8°F | Ht 62.0 in | Wt 313.0 lb

## 2015-12-09 DIAGNOSIS — Z794 Long term (current) use of insulin: Secondary | ICD-10-CM

## 2015-12-09 DIAGNOSIS — IMO0001 Reserved for inherently not codable concepts without codable children: Secondary | ICD-10-CM

## 2015-12-09 DIAGNOSIS — E1165 Type 2 diabetes mellitus with hyperglycemia: Secondary | ICD-10-CM

## 2015-12-09 LAB — POCT GLYCOSYLATED HEMOGLOBIN (HGB A1C): Hemoglobin A1C: 13.3

## 2015-12-09 MED ORDER — INSULIN GLARGINE 100 UNIT/ML SOLOSTAR PEN: 700.0000 [IU] | PEN_INJECTOR | Freq: Every morning | SUBCUTANEOUS | Status: DC

## 2015-12-09 NOTE — Progress Notes (Signed)
Subjective:    Patient ID: [name], female    DOB: Aug 27, 1976, 40 y.o.   MRN: [id number]  HPI Pt returns for f/u of diabetes mellitus: DM type: Insulin-requiring type 2 Dx'ed: 99991111 Complications: polyneuropathy. Therapy: insulin since 2007 pregnancy. DKA: never. Severe hypoglycemia: never.   Pancreatitis: never.   Other: she takes QD insulin, after poor results with multiple daily injections; she has severe insulin resistance; she can't have weight-loss surgery, due to having medicaid; she did not tolerate metformin (nausea); she does not take birth control.   Interval history: She take lantus, 600 units qam.  Pt says she misses it approx once per week.  no cbg record, but states cbg's vary from 125-500.  It is in general higher as the day goes on.  She reports pain and numbness of the feet.  She is hesitant to increase the insulin any further.   Past Medical History  Diagnosis Date  . Morbid obesity ([place])   . Alveolar hypoventilation   . Asthma   . GERD (gastroesophageal reflux disease)   . Rectal fissure   . Hypertension   . Costochondritis   . Cellulitis 08/2010-08/2011  . Depression   . Bipolar 2 disorder ([place])   . Obstructive sleep apnea   . Chest pain     NO CAD by cath [id number], nl LV systolic fxn  . Diabetes mellitus 2000    Type 2, Uncontrolled  . Arthritis   . HLD (hyperlipidemia)   . Anemia   . COPD (chronic obstructive pulmonary disease) ([place])   . Varicose veins     Right medial thigh and Left leg   . Drug-seeking behavior   . Chronic pain     Past Surgical History  Procedure Laterality Date  . Carpal tunnel release    . Bil knee surgery    . Mass excision N/A 06/29/2013    Procedure:  WIDE LOCAL EXCISION OF POSTERIOR NECK ABSCESS;  Surgeon: Ralene Ok, MD;  Location: [facility];  Service: General;  Laterality: N/A;  . Cardiac catheterization    . Left heart catheterization with coronary angiogram N/A 07/27/2012    Procedure: LEFT HEART CATHETERIZATION  WITH CORONARY ANGIOGRAM;  Surgeon: Sherren Mocha, MD;  Location: [facility] CATH LAB;  Service: Cardiovascular;  Laterality: N/A;    Social History   Social History  . Marital Status: Married    Spouse Name: N/A  . Number of Children: N/A  . Years of Education: N/A   Occupational History  . Not on file.   Social History Main Topics  . Smoking status: Former Smoker -- 0.30 packs/day for .3 years    Types: Cigarettes    Quit date: 12/06/1993  . Smokeless tobacco: Never Used  . Alcohol Use: No  . Drug Use: No  . Sexual Activity:    Partners: Male   Other Topics Concern  . Not on file   Social History Narrative   Lives in [place] with her fiance and 38 yr old dtr.    Current Outpatient Prescriptions on File Prior to Visit  Medication Sig Dispense Refill  . atorvastatin (LIPITOR) 20 MG tablet Take 20 mg by mouth daily.    . carvedilol (COREG) 12.5 MG tablet TAKE 1 TABLET BY MOUTH TWICE DAILY WITH A MEAL 60 tablet 5  . cetirizine (ZYRTEC) 10 MG tablet TAKE 1 TABLET BY MOUTH DAILY 30 tablet 11  . Cholecalciferol 50000 UNITS TABS Take 1 tablet by mouth once a week. For 12 weeks (Patient  taking differently: Take 1 tablet by mouth once a week. For 12 weeks Wednesday) 12 tablet 0  . clotrimazole (LOTRIMIN) 1 % cream APPLY TOPICALLY 2 TIMES A DAY AS NEEDED FOR SKIN YEAST 60 g 2  . cyclobenzaprine (FLEXERIL) 10 MG tablet TAKE 1 TABLET BY MOUTH TWICE DAILY AS NEEDED FOR MUSCLE SPASMS. 30 tablet 2  . fluconazole (DIFLUCAN) 150 MG tablet Take 1 tablet (150 mg total) by mouth once. May repeat in 3 days if still having symptoms. 2 tablet 2  . fluticasone (FLONASE) 50 MCG/ACT nasal spray Place 2 sprays into both nostrils daily. 16 g 6  . gabapentin (NEURONTIN) 300 MG capsule TAKE 3 CAPSULES BY MOUTH EVERY MORNING AND AFTERNOON AND TAKE 4 CAPSULES BY MOUTH EVERY EVENING. 300 capsule 1  . HYDROcodone-acetaminophen (NORCO) 10-325 MG tablet Take 1 tablet by mouth every 6 (six) hours as needed for severe pain.  120 tablet 0  . hydrocortisone cream 0.5 % Apply 1 application topically 2 (two) times daily as needed for itching. To feet 30 g 0  . ibuprofen (ADVIL,MOTRIN) 800 MG tablet Take 1 tablet (800 mg total) by mouth 3 (three) times daily with meals. 21 tablet 0  . lisinopril (PRINIVIL,ZESTRIL) 10 MG tablet Take 1 tablet (10 mg total) by mouth daily. 30 tablet 5  . nitroGLYCERIN (NITROSTAT) 0.4 MG SL tablet DISSOLVE 1 TABLET UNDER THE TONGUE EVERY 5 MINUTES AS NEEDED FOR CHEST PAIN. 30 tablet 1  . ofloxacin (FLOXIN OTIC) 0.3 % otic solution Place 5 drops into the right ear 2 (two) times daily. For 5 days 10 mL 0  . pantoprazole (PROTONIX) 40 MG tablet TAKE 1 TABLET BY MOUTH EVERY DAY 90 tablet 3  . pramipexole (MIRAPEX) 0.125 MG tablet TAKE 3 TABLETS BY MOUTH EVERY NIGHT AT BEDTIME 270 tablet 1  . PROCTOSOL HC 2.5 % rectal cream APPLY TO RECTAL AREA TWICE DAILY 28.35 g 0  . PROVENTIL HFA 108 (90 BASE) MCG/ACT inhaler INHALE 1 PUFF BY MOUTH EVERY 6 HOURS AS NEEDED FOR WHEEZING OR SHORTNESS OF BREATH. 6.7 g 11  . SYMBICORT 160-4.5 MCG/ACT inhaler INHALE 2 PUFFS INTO LUNGS TWICE A DAY 10.2 g 5  . topiramate (TOPAMAX) 25 MG tablet TAKE 1 TABLET BY MOUTH EVERY NIGHT AT BEDTIME 30 tablet 3  . traMADol (ULTRAM) 50 MG tablet TAKE 1 TABLET BY MOUTH EVERY DAY AS NEEDED FOR MODERATE PAIN **MUST LAST 30 DAYS** 15 tablet 3  . VOLTAREN 1 % GEL APPLY 2 GRAMS TOPICALLY TO AFFECTED AREA TWICE DAILY 100 g 0   No current facility-administered medications on file prior to visit.    Allergies  Allergen Reactions  . Kiwi Extract Anaphylaxis  . Nubain [Nalbuphine Hcl] Other (See Comments)    Reaction:  Nervousness    Family History  Problem Relation Age of Onset  . Diabetes Mother   . Hyperlipidemia Mother   . Depression Mother   . Varicose Veins Mother   . Heart attack Paternal Uncle   . Heart disease Paternal Grandmother   . Heart attack Paternal Grandmother   . Heart attack Paternal Grandfather   . Heart  disease Paternal Grandfather   . Heart attack Father     BP 136/92 mmHg  Pulse 91  Temp(Src) 97.8 F (36.6 C) (Oral)  Ht 5\' 2"  (1.575 m)  Wt 313 lb (141.976 kg)  BMI 57.23 kg/m2  SpO2 93%  Review of Systems She denies hypoglycemia.      Objective:   Physical Exam VITAL  SIGNS:  See vs page GENERAL: no distress.  Morbid obesity. Pulses: dorsalis pedis intact bilat.   MSK: no deformity of the feet CV: no leg edema Skin:  no ulcer on the feet.  normal temp on the feet.  Patchy hyperpigmentation on the feet.  Neuro: sensation is intact to touch on the feet, but decreased from normal   A1c=13.3%    Assessment & Plan:  DM: complicated by severe insulin resistance.  she needs increased rx.  She insists she takes insulin as rx'ed.  I've asked my nurse to call pharmacy, to verify pt is filling rx's.     Patient is advised the following: Patient Instructions  Please increase the lantus to 700 units each morning.  i have sent a prescription to your pharmacy. check your blood sugar twice a day.  vary the time of day when you check, between before the 3 meals, and at bedtime.  also check if you have symptoms of your blood sugar being too high or too low.  please keep a record of the readings and bring it to your next appointment here.  You can write it on any piece of paper.  please call us sooner if your blood sugar goes below 70, or if you have a lot of readings over 200.  Please ask your PCP about your foot symptoms, as I can only address the diabetes here. In view of your medical condition, you should avoid pregnancy until we have decided it is safe.   Please come back for a follow-up appointment in 2 months.

## 2015-12-09 NOTE — Patient Instructions (Addendum)
Please increase the lantus to 700 units each morning.  i have sent a prescription to your pharmacy. check your blood sugar twice a day.  vary the time of day when you check, between before the 3 meals, and at bedtime.  also check if you have symptoms of your blood sugar being too high or too low.  please keep a record of the readings and bring it to your next appointment here.  You can write it on any piece of paper.  please call us sooner if your blood sugar goes below 70, or if you have a lot of readings over 200.  Please ask your PCP about your foot symptoms, as I can only address the diabetes here. In view of your medical condition, you should avoid pregnancy until we have decided it is safe.   Please come back for a follow-up appointment in 2 months.

## 2015-12-23 ENCOUNTER — Ambulatory Visit: Payer: Medicaid Other | Admitting: Family Medicine

## 2015-12-23 ENCOUNTER — Encounter: Payer: Self-pay | Admitting: Family Medicine

## 2015-12-23 ENCOUNTER — Ambulatory Visit (INDEPENDENT_AMBULATORY_CARE_PROVIDER_SITE_OTHER): Payer: Medicaid Other | Admitting: Family Medicine

## 2015-12-23 VITALS — BP 120/70 | HR 98 | Temp 98.2°F | Ht 67.0 in | Wt 305.0 lb

## 2015-12-23 DIAGNOSIS — G8929 Other chronic pain: Secondary | ICD-10-CM

## 2015-12-23 DIAGNOSIS — Z7189 Other specified counseling: Secondary | ICD-10-CM | POA: Diagnosis not present

## 2015-12-23 DIAGNOSIS — E559 Vitamin D deficiency, unspecified: Secondary | ICD-10-CM

## 2015-12-23 DIAGNOSIS — H7291 Unspecified perforation of tympanic membrane, right ear: Secondary | ICD-10-CM | POA: Diagnosis not present

## 2015-12-23 DIAGNOSIS — L089 Local infection of the skin and subcutaneous tissue, unspecified: Secondary | ICD-10-CM | POA: Diagnosis not present

## 2015-12-23 DIAGNOSIS — G2581 Restless legs syndrome: Secondary | ICD-10-CM | POA: Diagnosis not present

## 2015-12-23 DIAGNOSIS — R51 Headache: Secondary | ICD-10-CM | POA: Diagnosis not present

## 2015-12-23 DIAGNOSIS — I10 Essential (primary) hypertension: Secondary | ICD-10-CM | POA: Diagnosis not present

## 2015-12-23 MED ORDER — HYDROCODONE-ACETAMINOPHEN 10-325 MG PO TABS: 1.0000 | ORAL_TABLET | Freq: Three times a day (TID) | ORAL | Status: DC | PRN

## 2015-12-23 NOTE — Patient Instructions (Addendum)
Your ear looks good. Ask the pharmacy if there is a cheaper version of the vitamin D for you to take. I want you to be on 50,000 units once per week.  Refilled pain medicine  Avoid propping your legs to help with the numbness  See me in 2 months  Be well, Dr. [name]

## 2015-12-26 NOTE — Progress Notes (Signed)
Date of Visit: 12/23/2015   HPI:  Ear pain - R ear pain better. Saw ENT who told her that tympanic membrane was healing and would continue to get better. Hearing well. No fevers or nasal congestion.  Chronic pain - needs refill of pain medicine today. Had increased quantity last month due to pain in ear, ok with going back down today. Medicine helps her pain and function when she takes it.   Vit D deficiency - has one pill left because it was expensive, was told medicaid couldn't cover it. Has not completed full course.  Leg numbness - has numbness in legs when she sits in a certain way on her couch. Doesn't happen other times. Sits with legs propped up.  ROS: See HPI.  [place]: history of morbid obesity, poorly controled diabetes followed by endocrinology  PHYSICAL EXAM: BP 120/70 mmHg  Pulse 98  Temp(Src) 98.2 F (36.8 C) (Oral)  Ht 5\' 7"  (1.702 m)  Wt 305 lb (138.347 kg)  BMI 47.76 kg/m2  SpO2 98%  LMP 11/06/2015 Gen: NAD, pleasant, cooperative HEENT: L tympanic membrane normal, R tympanic membrane healing from prior rupture Lungs: normal work of breathing  Neuro: alert grossly nonfocal, speech normal Ext: full strength bilateral lower extremities. Sensation intact in bilateral lower extremities   ASSESSMENT/PLAN:  Encounter for chronic pain management Well controlled. Decrease quantity back down to #90 per month. Given 2 months worth of rx. Follow up in 2 months.   Ruptured tympanic membrane Improving. Continue to monitor for recurrence of symptoms.   Vitamin D deficiency Encouraged her to speak with pharmacy about what formulations medicaid will cover, if any.  Leg numbness - positional only. Encouraged her to watch tv in other positions than propping feet up while sitting on couch. Weight management would also help.  FOLLOW UP: Follow up in 2 months for chronic medical conditions  [name], [place]

## 2015-12-26 NOTE — Assessment & Plan Note (Signed)
Encouraged her to speak with pharmacy about what formulations medicaid will cover, if any.

## 2015-12-26 NOTE — Assessment & Plan Note (Signed)
Improving. Continue to monitor for recurrence of symptoms.

## 2015-12-26 NOTE — Assessment & Plan Note (Signed)
Well controlled. Decrease quantity back down to #90 per month. Given 2 months worth of rx. Follow up in 2 months.

## 2016-02-06 ENCOUNTER — Ambulatory Visit: Payer: Medicaid Other | Admitting: Endocrinology

## 2016-02-20 ENCOUNTER — Ambulatory Visit (INDEPENDENT_AMBULATORY_CARE_PROVIDER_SITE_OTHER): Payer: Medicaid Other | Admitting: Family Medicine

## 2016-02-20 ENCOUNTER — Ambulatory Visit (HOSPITAL_COMMUNITY)
Admission: RE | Admit: 2016-02-20 | Discharge: 2016-02-20 | Disposition: A | Discharge status: Home / Self Care | Payer: Medicaid Other | Source: Ambulatory Visit | Attending: Family Medicine | Admitting: Family Medicine

## 2016-02-20 ENCOUNTER — Encounter: Payer: Self-pay | Admitting: Family Medicine

## 2016-02-20 VITALS — BP 140/88 | HR 106 | Temp 97.7°F | Ht 67.0 in | Wt 301.0 lb

## 2016-02-20 DIAGNOSIS — I10 Essential (primary) hypertension: Secondary | ICD-10-CM | POA: Diagnosis not present

## 2016-02-20 DIAGNOSIS — G4700 Insomnia, unspecified: Secondary | ICD-10-CM

## 2016-02-20 DIAGNOSIS — G2581 Restless legs syndrome: Secondary | ICD-10-CM | POA: Diagnosis not present

## 2016-02-20 DIAGNOSIS — R000 Tachycardia, unspecified: Secondary | ICD-10-CM | POA: Diagnosis not present

## 2016-02-20 DIAGNOSIS — R55 Syncope and collapse: Secondary | ICD-10-CM | POA: Insufficient documentation

## 2016-02-20 DIAGNOSIS — L089 Local infection of the skin and subcutaneous tissue, unspecified: Secondary | ICD-10-CM | POA: Diagnosis not present

## 2016-02-20 DIAGNOSIS — R51 Headache: Secondary | ICD-10-CM | POA: Diagnosis not present

## 2016-02-20 DIAGNOSIS — Z72820 Sleep deprivation: Secondary | ICD-10-CM

## 2016-02-20 DIAGNOSIS — Z7189 Other specified counseling: Secondary | ICD-10-CM | POA: Diagnosis not present

## 2016-02-20 DIAGNOSIS — Z7689 Persons encountering health services in other specified circumstances: Secondary | ICD-10-CM

## 2016-02-20 DIAGNOSIS — G8929 Other chronic pain: Secondary | ICD-10-CM

## 2016-02-20 MED ORDER — HYDROCODONE-ACETAMINOPHEN 10-325 MG PO TABS: 1.0000 | ORAL_TABLET | Freq: Three times a day (TID) | ORAL | Status: DC | PRN

## 2016-02-20 MED ORDER — DOXYCYCLINE HYCLATE 100 MG PO TABS: 100.0000 mg | ORAL_TABLET | Freq: Two times a day (BID) | ORAL | Status: DC

## 2016-02-20 NOTE — Patient Instructions (Addendum)
Referring to  -sleep doctor -cardiologist for the passing out  Use caution when you get up. Your BP drops some. Take it easy while walking around Drink plenty of fluids  Sent in doxycycline twice a day for the abscess  Pain medication refill for 2 months  Also checking ultrasound of heart. We will call you with that appointment.   See me in 1 month  Be well, Dr. [name]

## 2016-02-20 NOTE — Progress Notes (Signed)
Date of Visit: 02/20/2016   HPI:  Patient presents today to discsus multiple problems:  - hand pain - saw hand surgeon and was told she likely has pinched nerve in her neck. Had nerve conduction study done but hasn't met with the doctor yet to go over the results. Was referred to a neck surgeon, has that appointment on May 5  - trouble sleeping - having lots of trouble with sleeping. Uses CPAP even when she takes a nap. Stays up all night and sleeps udring the day. Has previously received handout on sleep hygiene. Had sleep study within the last few years but patient does not think that her CPAP settings were updated with the new recommended settings  - L thigh abscess - noted abscess to inside of left thigh. Already draining. Noticed it for a few days. No fever or vomiting. Eating and drinking well.   - dizziness - has felt dizzy/lightheaded recently. Had an episode where she passed out in the bathroom after getting gout of the shower. Recalls that she hadn't eaten anything that day. Episodes of dizziness have happened 2-3 times but only passed out the one time.   - chronic pain - needs refill of pain medication. It helps her be functional. No unwanted side effects.    ROS: See HPI.  [place]: history of obesity, poorly contorlled diabetes, OHS, OSA, chronic pain on chronic opioids, hypertension,   PHYSICAL EXAM: BP 140/88 mmHg  Pulse 106  Temp(Src) 97.7 F (36.5 C) (Oral)  Ht 5' 7" (1.702 m)  Wt 301 lb (136.533 kg)  BMI 47.13 kg/m2 Gen: NAD, pleasant, cooperative HEENT: normocephalic, atraumatic, moist mucous membranes  Heart: regular rate and rhythm, no murmur Lungs: clear to auscultation bilaterally normal work of breathing  Neuro: alert grossly nonfocal, speech normal Ext: No appreciable lower extremity edema bilaterally  Skin: area of erythema to left upper thigh in inguinal area. Open skin area spontaneously draining purulent material. Mild warmth.    ASSESSMENT/PLAN:   Syncope Suspect this was due to hypovolemia. Orthostatics mildly positive today Encourage dcuation with standing, also encouraged drinking plenty of fluids EKG today without signs of arrhythmia Update echo to ensure no valvular issue Had planned for cardiology referral, but will hold off as patient remebered at end of visit that she hadn't eatne that day, likely this was the explanation Follow up with me in 1 month .  Encounter for chronic pain management Stable. Refill medications for 2 months.   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 No indication for I&D at present. Will rx doxycycline given known poorly controlled diabetes, at risk for poor healing. Follow up if not improving.  Hand pain - encouraged follow up with hand surgery to discuss results of nerve conduction study  FOLLOW UP: Follow up in 1 month for above issues Referring to sleep medicine  [name], [place]

## 2016-02-25 DIAGNOSIS — R55 Syncope and collapse: Secondary | ICD-10-CM

## 2016-02-25 HISTORY — DX: Syncope and collapse: R55

## 2016-02-25 NOTE — Assessment & Plan Note (Signed)
Suspect this was due to hypovolemia. Orthostatics mildly positive today Encourage dcuation with standing, also encouraged drinking plenty of fluids EKG today without signs of arrhythmia Update echo to ensure no valvular issue Had planned for cardiology referral, but will hold off as patient remebered at end of visit that she hadn't eatne that day, likely this was the explanation Follow up with me in 1 month .

## 2016-02-25 NOTE — Assessment & Plan Note (Signed)
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

## 2016-02-25 NOTE — Assessment & Plan Note (Signed)
Stable. Refill medications for 2 months.

## 2016-02-28 ENCOUNTER — Emergency Department (HOSPITAL_COMMUNITY): Payer: Medicaid Other

## 2016-02-28 ENCOUNTER — Emergency Department (HOSPITAL_COMMUNITY)
Admission: EM | Admit: 2016-02-28 | Discharge: 2016-02-28 | Discharge status: Against Medical Advice | Payer: Medicaid Other | Attending: Emergency Medicine | Admitting: Emergency Medicine

## 2016-02-28 ENCOUNTER — Encounter (HOSPITAL_COMMUNITY): Payer: Self-pay

## 2016-02-28 DIAGNOSIS — F3181 Bipolar II disorder: Secondary | ICD-10-CM | POA: Insufficient documentation

## 2016-02-28 DIAGNOSIS — E119 Type 2 diabetes mellitus without complications: Secondary | ICD-10-CM | POA: Insufficient documentation

## 2016-02-28 DIAGNOSIS — Z7951 Long term (current) use of inhaled steroids: Secondary | ICD-10-CM | POA: Insufficient documentation

## 2016-02-28 DIAGNOSIS — Z794 Long term (current) use of insulin: Secondary | ICD-10-CM | POA: Diagnosis not present

## 2016-02-28 DIAGNOSIS — E785 Hyperlipidemia, unspecified: Secondary | ICD-10-CM | POA: Insufficient documentation

## 2016-02-28 DIAGNOSIS — R55 Syncope and collapse: Secondary | ICD-10-CM | POA: Insufficient documentation

## 2016-02-28 DIAGNOSIS — Z79899 Other long term (current) drug therapy: Secondary | ICD-10-CM | POA: Diagnosis not present

## 2016-02-28 DIAGNOSIS — R42 Dizziness and giddiness: Secondary | ICD-10-CM | POA: Insufficient documentation

## 2016-02-28 DIAGNOSIS — M1990 Unspecified osteoarthritis, unspecified site: Secondary | ICD-10-CM | POA: Diagnosis not present

## 2016-02-28 DIAGNOSIS — Z9889 Other specified postprocedural states: Secondary | ICD-10-CM | POA: Diagnosis not present

## 2016-02-28 DIAGNOSIS — R000 Tachycardia, unspecified: Secondary | ICD-10-CM | POA: Diagnosis not present

## 2016-02-28 DIAGNOSIS — K219 Gastro-esophageal reflux disease without esophagitis: Secondary | ICD-10-CM | POA: Insufficient documentation

## 2016-02-28 DIAGNOSIS — Z862 Personal history of diseases of the blood and blood-forming organs and certain disorders involving the immune mechanism: Secondary | ICD-10-CM | POA: Insufficient documentation

## 2016-02-28 DIAGNOSIS — G8929 Other chronic pain: Secondary | ICD-10-CM | POA: Insufficient documentation

## 2016-02-28 DIAGNOSIS — I10 Essential (primary) hypertension: Secondary | ICD-10-CM | POA: Diagnosis not present

## 2016-02-28 DIAGNOSIS — J449 Chronic obstructive pulmonary disease, unspecified: Secondary | ICD-10-CM | POA: Insufficient documentation

## 2016-02-28 DIAGNOSIS — Z87891 Personal history of nicotine dependence: Secondary | ICD-10-CM | POA: Diagnosis not present

## 2016-02-28 DIAGNOSIS — R079 Chest pain, unspecified: Secondary | ICD-10-CM | POA: Diagnosis present

## 2016-02-28 DIAGNOSIS — Z872 Personal history of diseases of the skin and subcutaneous tissue: Secondary | ICD-10-CM | POA: Diagnosis not present

## 2016-02-28 LAB — CBC
HCT: 39.8 % (ref 36.0–46.0)
HEMOGLOBIN: 13.9 g/dL (ref 12.0–15.0)
MCH: 30.4 pg (ref 26.0–34.0)
MCHC: 34.9 g/dL (ref 30.0–36.0)
MCV: 87.1 fL (ref 78.0–100.0)
PLATELETS: 340 10*3/uL (ref 150–400)
RBC: 4.57 MIL/uL (ref 3.87–5.11)
RDW: 12.7 % (ref 11.5–15.5)
WBC: 12.2 10*3/uL — ABNORMAL HIGH (ref 4.0–10.5)

## 2016-02-28 LAB — BASIC METABOLIC PANEL
ANION GAP: 12 (ref 5–15)
BUN: 15 mg/dL (ref 6–20)
CHLORIDE: 102 mmol/L (ref 101–111)
CO2: 21 mmol/L — ABNORMAL LOW (ref 22–32)
Calcium: 8.9 mg/dL (ref 8.9–10.3)
Creatinine, Ser: 0.63 mg/dL (ref 0.44–1.00)
Glucose, Bld: 440 mg/dL — ABNORMAL HIGH (ref 65–99)
POTASSIUM: 4.1 mmol/L (ref 3.5–5.1)
SODIUM: 135 mmol/L (ref 135–145)

## 2016-02-28 LAB — I-STAT TROPONIN, ED: TROPONIN I, POC: 0 ng/mL (ref 0.00–0.08)

## 2016-02-28 MED ORDER — SODIUM CHLORIDE 0.9 % IV BOLUS (SEPSIS): 2000.0000 mL | Freq: Once | INTRAVENOUS | Status: DC

## 2016-02-28 NOTE — ED Notes (Signed)
Dr. Claudine Mouton at bedside. Pt requesting IV to be removed. Pt stating that she does not "want to be stuck again and I don't want that dye, I just wanna go home". This RN and Dr Claudine Mouton attempted to have pt stay and be further evaluated. Pt stated again that she didn't want to stay. Dr, Claudine Mouton advised pt about the risks of leaving without further evaluation, the pt stated that she understood and again stated that she "just wanna leave".

## 2016-02-28 NOTE — ED Notes (Signed)
Explained to patient that the MD has ordered a CTA, which requires an IV mid forearm and up. Pt currently has IV in hand. Patient appeared upset at this news and told this RN to "just take her IV out because she wants to go home and does not want to be stuck again." This RN consoled patient and explained the importance of the CTA to rule out blood clots. Patient still refuses to be stuck again at this time. MD made aware.

## 2016-02-28 NOTE — ED Notes (Signed)
PER EMS: pt here with c/o syncopal episodes every other day for the last 3 weeks; latest episode was earlier today. She saw her PCP March 17th and was told it was possibly her BP dropping. Normally her BP is 120/80 and she took her BP meds today. She reports central chest pain that started about 45 mins PTA. She has had similar pain in the past. BP: 170/110 initially. She was given 1 SL nitro and BP then 150/90 but the nitro made her pain decrease from 7/10 to 6/10. CBG- 380. HR- 110. 96% RA.

## 2016-02-28 NOTE — Discharge Instructions (Signed)
Syncope [name], you need to return to the ED immediately or see your primary care doctor immediately to complete your care.  It is dangerous for you to be passing out without any diagnosis.  Return immediately to complete your care. Thank you. Syncope means a person passes out (faints). The person usually wakes up in less than 5 minutes. It is important to seek medical care for syncope. HOME CARE  Have someone stay with you until you feel normal.  Do not drive, use machines, or play sports until your doctor says it is okay.  Keep all doctor visits as told.  Lie down when you feel like you might pass out. Take deep breaths. Wait until you feel normal before standing up.  Drink enough fluids to keep your pee (urine) clear or pale yellow.  If you take blood pressure or heart medicine, get up slowly. Take several minutes to sit and then stand. GET HELP RIGHT AWAY IF:   You have a severe headache.  You have pain in the chest, belly (abdomen), or back.  You are bleeding from the mouth or butt (rectum).  You have black or tarry poop (stool).  You have an irregular or very fast heartbeat.  You have pain with breathing.  You keep passing out, or you have shaking (seizures) when you pass out.  You pass out when sitting or lying down.  You feel confused.  You have trouble walking.  You have severe weakness.  You have vision problems. If you fainted, call for help (911 in [place] Do not drive yourself to the hospital.   This information is not intended to replace advice given to you by your health care provider. Make sure you discuss any questions you have with your health care provider.   Document Released: 05/10/2008 Document Revised: 04/08/2015 Document Reviewed: 01/21/2012 Elsevier Interactive Patient Education [employer].

## 2016-02-28 NOTE — ED Notes (Signed)
MD at bedside. 

## 2016-02-28 NOTE — ED Provider Notes (Signed)
CSN: [id number]     Arrival date & time 02/28/16  0048 History  By signing my name below, I, [name], attest that this documentation has been prepared under the direction and in the presence of Everlene Balls, MD. Electronically Signed: Judithann Sauger, ED Scribe. 02/28/2016. 3:09 AM.    Chief Complaint  Patient presents with  . Chest Pain   The history is provided by the patient. No language interpreter was used.   HPI Comments: [name] is a 40 y.o. female with a hx of HTN , HLD, who presents to the Emergency Department complaining of gradually worsening sharp right lateral CP onset today. She explains that she has been having increased dizziness and multiple episodes of syncope 3 weeks ago.  She denies any specific factors with her CP onset. Pt saw her PCP on 02/20/16 and was told that her syncopal episodes were due to a sudden drop in her BP. She denies any recent long trips, PE/DVT,  She denies any n/v/d, diaphoresis, leg swelling, bowel/bladder symptoms, or any urinary symptoms.    Past Medical History  Diagnosis Date  . Morbid obesity ([place])   . Alveolar hypoventilation   . Asthma   . GERD (gastroesophageal reflux disease)   . Rectal fissure   . Hypertension   . Costochondritis   . Cellulitis 08/2010-08/2011  . Depression   . Bipolar 2 disorder ([place])   . Obstructive sleep apnea   . Chest pain     NO CAD by cath [id number], nl LV systolic fxn  . Diabetes mellitus 2000    Type 2, Uncontrolled  . Arthritis   . HLD (hyperlipidemia)   . Anemia   . COPD (chronic obstructive pulmonary disease) ([place])   . Varicose veins     Right medial thigh and Left leg   . Drug-seeking behavior   . Chronic pain    Past Surgical History  Procedure Laterality Date  . Carpal tunnel release    . Bil knee surgery    . Mass excision N/A 06/29/2013    Procedure:  WIDE LOCAL EXCISION OF POSTERIOR NECK ABSCESS;  Surgeon: Ralene Ok, MD;  Location: [facility];  Service: General;  Laterality:  N/A;  . Cardiac catheterization    . Left heart catheterization with coronary angiogram N/A 07/27/2012    Procedure: LEFT HEART CATHETERIZATION WITH CORONARY ANGIOGRAM;  Surgeon: Sherren Mocha, MD;  Location: [facility] CATH LAB;  Service: Cardiovascular;  Laterality: N/A;   Family History  Problem Relation Age of Onset  . Diabetes Mother   . Hyperlipidemia Mother   . Depression Mother   . Varicose Veins Mother   . Heart attack Paternal Uncle   . Heart disease Paternal Grandmother   . Heart attack Paternal Grandmother   . Heart attack Paternal Grandfather   . Heart disease Paternal Grandfather   . Heart attack Father    Social History  Substance Use Topics  . Smoking status: Former Smoker -- 0.30 packs/day for .3 years    Types: Cigarettes    Quit date: 12/06/1993  . Smokeless tobacco: Never Used  . Alcohol Use: No   OB History    Gravida Para Term Preterm AB TAB SAB Ectopic Multiple Living   2 1   1  1   1      Review of Systems  Constitutional: Negative for diaphoresis.  Cardiovascular: Positive for chest pain. Negative for leg swelling.  Gastrointestinal: Negative for nausea, vomiting and diarrhea.  Genitourinary: Negative for dysuria and hematuria.  All other systems reviewed and are negative.     Allergies  Kiwi extract and Nubain  Home Medications   Prior to Admission medications   Medication Sig Start Date End Date Taking? Authorizing Provider  atorvastatin (LIPITOR) 20 MG tablet Take 20 mg by mouth daily.    Historical Provider, MD  carvedilol (COREG) 12.5 MG tablet TAKE 1 TABLET BY MOUTH TWICE DAILY WITH A MEAL 08/12/15   [name], MD  cetirizine (ZYRTEC) 10 MG tablet TAKE 1 TABLET BY MOUTH DAILY 07/16/15   [name], MD  Cholecalciferol 50000 UNITS TABS Take 1 tablet by mouth once a week. For 12 weeks Patient taking differently: Take 1 tablet by mouth once a week. For 12 weeks Wednesday 10/29/15   [name], MD  clotrimazole (LOTRIMIN)  1 % cream APPLY TOPICALLY 2 TIMES A DAY AS NEEDED FOR SKIN YEAST 07/16/15   [name], MD  cyclobenzaprine (FLEXERIL) 10 MG tablet TAKE 1 TABLET BY MOUTH TWICE DAILY AS NEEDED FOR MUSCLE SPASMS. 11/13/15   [name], MD  doxycycline (VIBRA-TABS) 100 MG tablet Take 1 tablet (100 mg total) by mouth 2 (two) times daily. 02/20/16   [name], MD  fluconazole (DIFLUCAN) 150 MG tablet Take 1 tablet (150 mg total) by mouth once. May repeat in 3 days if still having symptoms. 09/15/15   [name], MD  fluticasone (FLONASE) 50 MCG/ACT nasal spray Place 2 sprays into both nostrils daily. 05/21/14   Melony Overly, MD  gabapentin (NEURONTIN) 300 MG capsule TAKE 3 CAPSULES BY MOUTH EVERY MORNING AND AFTERNOON AND TAKE 4 CAPSULES BY MOUTH EVERY EVENING. 10/21/15   [name], MD  HYDROcodone-acetaminophen [facility]) 10-325 MG tablet Take 1 tablet by mouth every 8 (eight) hours as needed for severe pain. 02/20/16   [name], MD  hydrocortisone cream 0.5 % Apply 1 application topically 2 (two) times daily as needed for itching. To feet 02/26/15   [name], MD  ibuprofen (ADVIL,MOTRIN) 800 MG tablet Take 1 tablet (800 mg total) by mouth 3 (three) times daily with meals. 12/29/14   [name], PA-C  Insulin Glargine (LANTUS SOLOSTAR) 100 UNIT/ML Solostar Pen Inject 700 Units into the skin every morning. And pen needles 4/day 12/09/15   [name], MD  lisinopril (PRINIVIL,ZESTRIL) 10 MG tablet Take 1 tablet (10 mg total) by mouth daily. 03/12/14   [name], MD  nitroGLYCERIN (NITROSTAT) 0.4 MG SL tablet DISSOLVE 1 TABLET UNDER THE TONGUE EVERY 5 MINUTES AS NEEDED FOR CHEST PAIN. 10/21/15   [name], MD  ofloxacin (FLOXIN OTIC) 0.3 % otic solution Place 5 drops into the right ear 2 (two) times daily. For 5 days 11/27/15   [name], MD  pantoprazole (PROTONIX) 40 MG tablet TAKE 1 TABLET BY MOUTH EVERY DAY 09/15/15   [name], MD  pramipexole (MIRAPEX) 0.125 MG tablet TAKE 3 TABLETS BY MOUTH EVERY NIGHT AT BEDTIME 11/13/15   [name], MD  PROCTOSOL HC 2.5 % rectal cream APPLY TO RECTAL AREA TWICE DAILY 06/10/15   Alveda Reasons, MD  PROVENTIL HFA 108 (90 BASE) MCG/ACT inhaler INHALE 1 PUFF BY MOUTH EVERY 6 HOURS AS NEEDED FOR WHEEZING OR SHORTNESS OF BREATH. 07/16/15   [name], MD  SYMBICORT 160-4.5 MCG/ACT inhaler INHALE 2 PUFFS INTO LUNGS TWICE A DAY 08/12/15   [name], MD  topiramate (TOPAMAX) 25 MG tablet TAKE 1 TABLET  BY MOUTH EVERY NIGHT AT BEDTIME 09/15/15   [name], MD  traMADol (ULTRAM) 50 MG tablet TAKE 1 TABLET BY MOUTH EVERY DAY AS NEEDED FOR MODERATE PAIN **MUST LAST 30 DAYS** 09/15/15   [name], MD  VOLTAREN 1 % GEL APPLY 2 GRAMS TOPICALLY TO AFFECTED AREA TWICE DAILY 06/10/15   Alveda Reasons, MD   BP 161/98 mmHg  Pulse 104  Temp(Src) 99 F (37.2 C) (Oral)  Resp 15  SpO2 99%  LMP 02/06/2016 Physical Exam  Constitutional: She is oriented to person, place, and time. She appears well-developed and well-nourished. No distress.  HENT:  Head: Normocephalic and atraumatic.  Nose: Nose normal.  Mouth/Throat: Oropharynx is clear and moist. No oropharyngeal exudate.  Eyes: Conjunctivae and EOM are normal. Pupils are equal, round, and reactive to light. No scleral icterus.  Neck: Normal range of motion. Neck supple. No JVD present. No tracheal deviation present. No thyromegaly present.  Cardiovascular: Regular rhythm and normal heart sounds.  Exam reveals no gallop and no friction rub.   No murmur heard. Tachycardiac but normal  Pulmonary/Chest: Effort normal and breath sounds normal. No respiratory distress. She has no wheezes. She exhibits no tenderness.  Abdominal: Soft. Bowel sounds are normal. She exhibits no distension and no mass. There is no tenderness. There is no rebound and no guarding.  Musculoskeletal: Normal range of motion. She  exhibits no edema or tenderness.  Lymphadenopathy:    She has no cervical adenopathy.  Neurological: She is alert and oriented to person, place, and time. No cranial nerve deficit. She exhibits normal muscle tone.  Skin: Skin is warm and dry. No rash noted. No erythema. No pallor.  Nursing note and vitals reviewed.   ED Course  Procedures (including critical care time) DIAGNOSTIC STUDIES: Oxygen Saturation is 96% on RA, normal by my interpretation.    COORDINATION OF CARE: 2:48 AM- Pt advised of plan for treatment and pt agrees. Pt will receive lab work and CT for further evaluation. She will also receive IV fluids.    Labs Review Labs Reviewed  BASIC METABOLIC PANEL - Abnormal; Notable for the following:    CO2 21 (*)    Glucose, Bld 440 (*)    All other components within normal limits  CBC - Abnormal; Notable for the following:    WBC 12.2 (*)    All other components within normal limits  URINALYSIS, ROUTINE W REFLEX MICROSCOPIC (NOT AT [facility])  Randolm Idol, ED  POC URINE PREG, ED    Imaging Review Dg Chest 2 View  02/28/2016  CLINICAL DATA:  40 year old female with chest pain and dizziness. EXAM: CHEST  2 VIEW COMPARISON:  Chest radiograph dated 09/29/2015 FINDINGS: The heart size and mediastinal contours are within normal limits. Both lungs are clear. The visualized skeletal structures are unremarkable. IMPRESSION: No active cardiopulmonary disease. Electronically Signed   By: Anner Crete M.D.   On: 02/28/2016 01:37   Everlene Balls, MD has personally reviewed and evaluated these images and lab results as part of his medical decision-making.   EKG Interpretation None      MDM   Final diagnoses:  None    Patient presents to the ED for CP and multiple syncopal episodes.  In addition, she is tachycardic.  I believe she needs a cardiac work up and CT for PE.  Patient, however is refusing any testing and wants to go hom immediately. She states she is only here  because her daughter called 911  when she passed out.  She has decision making capacity, is alert and oriented and understands that if this is a PE she may die.  She is leaving AMA an advised to return immediately to complete her care.     I personally performed the services described in this documentation, which was scribed in my presence. The recorded information has been reviewed and is accurate.     Everlene Balls, MD 02/28/16 [phone]

## 2016-02-28 NOTE — ED Notes (Signed)
324 aspirin also given en route

## 2016-03-15 ENCOUNTER — Ambulatory Visit (INDEPENDENT_AMBULATORY_CARE_PROVIDER_SITE_OTHER): Payer: Medicaid Other | Admitting: Family Medicine

## 2016-03-15 ENCOUNTER — Encounter: Payer: Self-pay | Admitting: Family Medicine

## 2016-03-15 VITALS — BP 133/87 | HR 92 | Temp 98.1°F | Ht 67.0 in | Wt 296.7 lb

## 2016-03-15 DIAGNOSIS — R55 Syncope and collapse: Secondary | ICD-10-CM | POA: Diagnosis not present

## 2016-03-15 DIAGNOSIS — G2581 Restless legs syndrome: Secondary | ICD-10-CM | POA: Diagnosis not present

## 2016-03-15 DIAGNOSIS — R51 Headache: Secondary | ICD-10-CM | POA: Diagnosis not present

## 2016-03-15 DIAGNOSIS — R110 Nausea: Secondary | ICD-10-CM | POA: Diagnosis not present

## 2016-03-15 DIAGNOSIS — L089 Local infection of the skin and subcutaneous tissue, unspecified: Secondary | ICD-10-CM | POA: Diagnosis not present

## 2016-03-15 DIAGNOSIS — Z7189 Other specified counseling: Secondary | ICD-10-CM | POA: Diagnosis not present

## 2016-03-15 DIAGNOSIS — I10 Essential (primary) hypertension: Secondary | ICD-10-CM | POA: Diagnosis not present

## 2016-03-15 LAB — POCT URINE PREGNANCY: PREG TEST UR: NEGATIVE

## 2016-03-15 MED ORDER — RANITIDINE HCL 150 MG PO TABS: 150.0000 mg | ORAL_TABLET | Freq: Two times a day (BID) | ORAL | Status: DC

## 2016-03-15 MED ORDER — ONDANSETRON 4 MG PO TBDP: 4.0000 mg | ORAL_TABLET | Freq: Three times a day (TID) | ORAL | Status: DC | PRN

## 2016-03-15 NOTE — Patient Instructions (Signed)
Call Dr. Antionette Char office for an appointment since you've been passing out  Getting ultrasound of heart  Try nausea medicine - sent this in Also add zantac (another acid reflux medicine)  Be well, Dr. [name]

## 2016-03-16 ENCOUNTER — Other Ambulatory Visit (HOSPITAL_COMMUNITY): Payer: Medicaid Other

## 2016-03-17 DIAGNOSIS — R110 Nausea: Secondary | ICD-10-CM | POA: Insufficient documentation

## 2016-03-17 NOTE — Progress Notes (Signed)
Date of Visit: 03/15/2016   HPI:  Patient presents to discuss several things:  - nausea - gets nauseated after eating. Has gone on for last 1-2 weeks. lmp was last Wednesday. No abdominal pain.   - syncope - has passed out about 3 times total. Last time was 2 weeks ago. Has had some dizzy spells about 1-2 times per week since then. No chest pain or shortness of breath. No loss of bowel/bladder during syncopal episodes. Was preceded by dizziness.   ROS: See HPI.  [place]: history obesity, hypertension, diabetes, chronic pain on chronic narcotics  PHYSICAL EXAM: BP 133/87 mmHg  Pulse 92  Temp(Src) 98.1 F (36.7 C) (Oral)  Ht 5\' 7"  (1.702 m)  Wt 296 lb 11.2 oz (134.582 kg)  BMI 46.46 kg/m2  LMP 03/10/2016 Gen: NAD, pleasant, cooperative HEENT: normocephalic, atraumatic, moist mucous membranes  Heart: regular rate and rhythm, no murmur Lungs: clear to auscultation bilaterally, normal work of breathing  Neuro: grossly nonfocal, speech normal Ext: mild lower extremity edema bilaterally Abdomen: soft nontender to palpation no masses or organomegaly. Morbidly obese.  ASSESSMENT/PLAN:  Syncope With recurrent syncope, needs cardiac workup. No chest pain, shortness of breath. Regular pulse today Patient will schedule appointment with her cardiologist. Offered referral but she stated she was able to schedule herself Does not drive, so road safety not a concern. Will also update echo to ensure no valvular disease  Nausea May have diabetic gastroparesis, certainly she is at risk. She is on chronic opioids, making it difficult to obtain reliable gastric emptying study. Urine pregnancy test negative today. Abdominal exam benign. Will do trial of zofran. If not improving patient to call us and will consider referral to gastroenterology Add ranitidine as well, already on PPI   FOLLOW UP: Follow up in 4 weeks with me for above issues Schedule appointment with cardiologist  [name].  [name], [place]

## 2016-03-17 NOTE — Assessment & Plan Note (Addendum)
May have diabetic gastroparesis, certainly she is at risk. She is on chronic opioids, making it difficult to obtain reliable gastric emptying study. Urine pregnancy test negative today. Abdominal exam benign. Will do trial of zofran. If not improving patient to call us and will consider referral to gastroenterology Add ranitidine as well, already on PPI

## 2016-03-17 NOTE — Assessment & Plan Note (Addendum)
With recurrent syncope, needs cardiac workup. No chest pain, shortness of breath. Regular pulse today Patient will schedule appointment with her cardiologist. Offered referral but she stated she was able to schedule herself Does not drive, so road safety not a concern. Will also update echo to ensure no valvular disease

## 2016-03-22 ENCOUNTER — Ambulatory Visit: Payer: Medicaid Other | Admitting: Family Medicine

## 2016-03-22 ENCOUNTER — Telehealth: Payer: Self-pay | Admitting: Family Medicine

## 2016-03-22 MED ORDER — ATORVASTATIN CALCIUM 20 MG PO TABS: 20.0000 mg | ORAL_TABLET | Freq: Every day | ORAL | Status: DC

## 2016-03-22 NOTE — Telephone Encounter (Signed)
[facility] calling and requesting a refill on the patients Lipitor. Patient is switching to [facility]. jw

## 2016-03-22 NOTE — Telephone Encounter (Signed)
Noted, thanks Nioka Thorington J Leeman Johnsey, MD  

## 2016-03-22 NOTE — Telephone Encounter (Signed)
FYI to PCP

## 2016-03-22 NOTE — Telephone Encounter (Signed)
Rx sent in. Thanks! Nikesh Teschner J Sharyon Peitz, MD  

## 2016-03-22 NOTE — Telephone Encounter (Signed)
[facility] went out and did a home visit. They found a few things. One was her PHQ9 was 11. She is no longer going to [facility] and they feel that this would benefit her so they have made the referral. The patient is also not taking her medications as she should. So they have transferred the patients prescriptions to [facility] since they do a pill box and help the patient be stay on track with there medications. If you have any questions please call [facility]. jw

## 2016-03-24 ENCOUNTER — Ambulatory Visit (HOSPITAL_COMMUNITY): Payer: Medicaid Other | Attending: Cardiovascular Disease

## 2016-04-01 ENCOUNTER — Encounter: Payer: Medicaid Other | Admitting: Physician Assistant

## 2016-04-01 NOTE — Progress Notes (Signed)
Cardiology Office Note:    Date:  04/01/2016   ID:  [name], DOB 18-Apr-1976, MRN [id number]  PCP:  [name], MD  Cardiologist:  Dr. Sherren Mocha  Electrophysiologist:  n/a  Referring MD: [name], MD   Chief Complaint  Patient presents with  . Loss of Consciousness    Referral from PCP - Dr. [name]    History of Present Illness:     [name] is a 40 y.o. female with a hx of Chest pain with normal coronary arteries at cardiac catheterization in 2013, morbid obesity, HTN, OSA, DM 2, HL. Last seen by Dr. Burt Knack 4/15.   Patient was recently seen by primary care 03/15/16 with recurrent syncope. Echocardiogram was arranged with follow-up today.   Past Medical History  Diagnosis Date  . Morbid obesity ([place])   . Alveolar hypoventilation   . Asthma   . GERD (gastroesophageal reflux disease)   . Rectal fissure   . Hypertension   . Costochondritis   . Cellulitis 08/2010-08/2011  . Depression   . Bipolar 2 disorder ([place])   . Obstructive sleep apnea   . Chest pain     NO CAD by cath [id number], nl LV systolic fxn  . Diabetes mellitus 2000    Type 2, Uncontrolled  . Arthritis   . HLD (hyperlipidemia)   . Anemia   . COPD (chronic obstructive pulmonary disease) ([place])   . Varicose veins     Right medial thigh and Left leg   . Drug-seeking behavior   . Chronic pain     Past Surgical History  Procedure Laterality Date  . Carpal tunnel release    . Bil knee surgery    . Mass excision N/A 06/29/2013    Procedure:  WIDE LOCAL EXCISION OF POSTERIOR NECK ABSCESS;  Surgeon: Ralene Ok, MD;  Location: [facility];  Service: General;  Laterality: N/A;  . Cardiac catheterization    . Left heart catheterization with coronary angiogram N/A 07/27/2012    Procedure: LEFT HEART CATHETERIZATION WITH CORONARY ANGIOGRAM;  Surgeon: Sherren Mocha, MD;  Location: [facility] CATH LAB;  Service: Cardiovascular;  Laterality: N/A;    Current Medications: No outpatient prescriptions  have been marked as taking for the 04/01/16 encounter (Office Visit) with Liliane Shi, PA-C.     Allergies:   Kiwi extract and Nubain   Social History   Social History  . Marital Status: Married    Spouse Name: N/A  . Number of Children: N/A  . Years of Education: N/A   Social History Main Topics  . Smoking status: Former Smoker -- 0.30 packs/day for .3 years    Types: Cigarettes    Quit date: 12/06/1993  . Smokeless tobacco: Never Used  . Alcohol Use: No  . Drug Use: No  . Sexual Activity:    Partners: Male   Other Topics Concern  . Not on file   Social History Narrative   Lives in [place] with her fiance and 38 yr old dtr.     Family History:  The patient's family history includes Depression in her mother; Diabetes in her mother; Heart attack in her father, paternal grandfather, paternal grandmother, and paternal uncle; Heart disease in her paternal grandfather and paternal grandmother; Hyperlipidemia in her mother; Varicose Veins in her mother.   ROS:   Please see the history of present illness.    ROS All other systems reviewed and are negative.   Physical Exam:    VS:  LMP 03/10/2016  GEN: Well nourished, well developed, in no acute distress HEENT: normal Neck: no JVD, no masses Cardiac: Normal S1/S2, RRR; no murmurs, rubs, or gallops, no edema;   carotid bruits,   Respiratory:  clear to auscultation bilaterally; no wheezing, rhonchi or rales GI: soft, nontender, nondistended MS: no deformity or atrophy Skin: warm and dry Neuro: No focal deficits  Psych: Alert and oriented x 3, normal affect  Wt Readings from Last 3 Encounters:  03/15/16 296 lb 11.2 oz (134.582 kg)  02/20/16 301 lb (136.533 kg)  12/23/15 305 lb (138.347 kg)      Studies/Labs Reviewed:     EKG:  EKG is  ordered today.  The ekg ordered today demonstrates   Recent Labs: 08/05/2015: TSH 1.895 11/23/2015: ALT 17 02/28/2016: BUN 15; Creatinine, Ser 0.63; Hemoglobin 13.9; Platelets 340;  Potassium 4.1; Sodium 135   Recent Lipid Panel    Component Value Date/Time   CHOL 250* 11/15/2014 1148   TRIG 362.0* 11/15/2014 1148   HDL 36.90* 11/15/2014 1148   CHOLHDL 7 11/15/2014 1148   VLDL 72.4* 11/15/2014 1148   LDLCALC 195* 08/25/2012 0912   LDLDIRECT 164.9 11/15/2014 1148    Additional studies/ records that were reviewed today include:    [place] 8/13 Final Conclusions:  1. Widely patent coronary arteries 2. Normal LV systolic function 3. Elevated LVEDP   ASSESSMENT:     1. Syncope, unspecified syncope type   2. Essential hypertension   3. HLD (hyperlipidemia)   4. Uncontrolled type 2 diabetes mellitus without complication, with long-term current use of insulin ([place])   5. Obstructive sleep apnea     PLAN:     In order of problems listed above:  1.   Medication Adjustments/Labs and Tests Ordered: Current medicines are reviewed at length with the patient today.  Concerns regarding medicines are outlined above.  Medication changes, Labs and Tests ordered today are outlined in the Patient Instructions noted below. There are no Patient Instructions on file for this visit. Signed, Richardson Dopp, PA-C  04/01/2016 8:14 AM    [facility] [address], [place], [place]  [address] Phone: [phone]; Fax: [phone]     This encounter was created in error - please disregard.

## 2016-04-20 ENCOUNTER — Encounter: Payer: Self-pay | Admitting: Family Medicine

## 2016-04-20 ENCOUNTER — Ambulatory Visit (INDEPENDENT_AMBULATORY_CARE_PROVIDER_SITE_OTHER): Payer: Medicaid Other | Admitting: Family Medicine

## 2016-04-20 VITALS — BP 118/60 | HR 98 | Temp 97.8°F | Ht 67.0 in | Wt 297.4 lb

## 2016-04-20 DIAGNOSIS — Z7189 Other specified counseling: Secondary | ICD-10-CM

## 2016-04-20 DIAGNOSIS — I10 Essential (primary) hypertension: Secondary | ICD-10-CM | POA: Diagnosis not present

## 2016-04-20 DIAGNOSIS — R55 Syncope and collapse: Secondary | ICD-10-CM

## 2016-04-20 DIAGNOSIS — L089 Local infection of the skin and subcutaneous tissue, unspecified: Secondary | ICD-10-CM | POA: Diagnosis not present

## 2016-04-20 DIAGNOSIS — R51 Headache: Secondary | ICD-10-CM | POA: Diagnosis not present

## 2016-04-20 DIAGNOSIS — G6289 Other specified polyneuropathies: Secondary | ICD-10-CM | POA: Diagnosis not present

## 2016-04-20 DIAGNOSIS — G2581 Restless legs syndrome: Secondary | ICD-10-CM | POA: Diagnosis not present

## 2016-04-20 DIAGNOSIS — G8929 Other chronic pain: Secondary | ICD-10-CM

## 2016-04-20 MED ORDER — HYDROCODONE-ACETAMINOPHEN 10-325 MG PO TABS: 1.0000 | ORAL_TABLET | Freq: Three times a day (TID) | ORAL | Status: DC | PRN

## 2016-04-20 MED ORDER — GABAPENTIN 600 MG PO TABS: ORAL_TABLET | ORAL | Status: DC

## 2016-04-20 NOTE — Progress Notes (Signed)
Date of Visit: 04/20/2016   HPI:  Chronic pain - taking norco 3x/day total. Pain well controlled.   Neuropathy - needs change in gabapentin prescription so that she's taking fewer pills total. Has been using 300mg  pills. Taking 3 in AM, 3 in PM, and 4-5 in evening.  Colon cancer screening - reports grandmother died at age 38 of colon cancer, had been diagnosed about 2-3 years before that. Great uncle also died of colon cancer. Wants to know when she needs colonoscopy.  Syncope - no further episodes of syncope. Did not go to cardoilogy appointment or echo appointment   ROS: See HPI.  [place]: history of GERD, diabetes, hyperlipidemia, hypertension, chronic pain, asthma  PHYSICAL EXAM: BP 142/92 mmHg  Pulse 98  Temp(Src) 97.8 F (36.6 C) (Oral)  Ht 5\' 7"  (1.702 m)  Wt 297 lb 6.4 oz (134.9 kg)  BMI 46.57 kg/m2  LMP 04/10/2016 Gen: NAD, pleasant, cooeprative HEENT: normocephalic, atraumatic moist mucous membranes  Heart: regular rate and rhythm, no murmur Lungs: clear to auscultation bilaterally, nwob Neuro: alert, grossly nonfocal, speech normal Ext: full strength bilateral lower extremities, sensation intact bilateral lower extremities   ASSESSMENT/PLAN:  Health maintenance:  -reviewed recommendations, and as both of her family members were older when diagnosed with colon cancer, no need for early screening. Can begin colonoscopy at age 58.  Chronic pain Well controlled. Continue current regimen. Refilled for 3 months  Neuropathy Switch to 600mg  tabs per patient's preference. Will take 1.5 in AM, 1.5 tab in PM, and 2 at night.  Syncope Fortunately no further episodes. Stressed importance of seeing cardiology for evaluation. At minimum she needs to call and cancel appointment instead of no-showing. Also needs to reschedule echo appointment, patient states she will do this.  FOLLOW UP: Follow up in 3 months for chronic medical problems Schedule appointment with  cardiology  [name], [place]

## 2016-04-20 NOTE — Patient Instructions (Addendum)
You can start colon cancer screening at age 40.  Please call and reschedule both the cardiologist appointment and the echo appointment.  Refilled pain medicine for 3 months   Changed gabapentin prescription to the 600 mg tablets Take 1.5 pill in the morning, 1.5 in afternoon, and 2 in the evening. Be well, Dr. [name]

## 2016-04-26 ENCOUNTER — Ambulatory Visit: Payer: Medicaid Other | Admitting: Endocrinology

## 2016-04-28 ENCOUNTER — Other Ambulatory Visit: Payer: Self-pay | Admitting: *Deleted

## 2016-04-29 MED ORDER — CYCLOBENZAPRINE HCL 10 MG PO TABS: ORAL_TABLET | ORAL | Status: DC

## 2016-05-05 ENCOUNTER — Other Ambulatory Visit (HOSPITAL_COMMUNITY): Payer: Medicaid Other

## 2016-05-07 ENCOUNTER — Other Ambulatory Visit: Payer: Self-pay | Admitting: Orthopedic Surgery

## 2016-05-07 ENCOUNTER — Encounter: Payer: Medicaid Other | Admitting: Physician Assistant

## 2016-05-07 NOTE — Progress Notes (Signed)
Cardiology Office Note:    Date:  05/07/2016   ID:  [name], DOB 1976/04/10, MRN [id number]  PCP:  [name], MD  Cardiologist:  Dr. Sherren Mocha   Electrophysiologist:  n/a  Referring MD: [name], MD   Chief Complaint  Patient presents with  . Loss of Consciousness    History of Present Illness:     [name] is a 40 y.o. female with a hx of Chest pain with normal coronary arteries at cardiac catheterization in 2013, morbid obesity, HTN, OSA, DM 2, HL. Last seen by Dr. Burt Knack 4/15.   Patient was recently seen by primary care 03/15/16 with recurrent syncope. Echocardiogram was arranged.  This was never performed. She missed her appointment in April. Recently seen back by primary care encouraged her to follow-up.    Past Medical History  Diagnosis Date  . Morbid obesity ([place])   . Alveolar hypoventilation   . Asthma   . GERD (gastroesophageal reflux disease)   . Rectal fissure   . Hypertension   . Costochondritis   . Cellulitis 08/2010-08/2011  . Depression   . Bipolar 2 disorder ([place])   . Obstructive sleep apnea   . Chest pain     NO CAD by cath [id number], nl LV systolic fxn  . Diabetes mellitus 2000    Type 2, Uncontrolled  . Arthritis   . HLD (hyperlipidemia)   . Anemia   . COPD (chronic obstructive pulmonary disease) ([place])   . Varicose veins     Right medial thigh and Left leg   . Drug-seeking behavior   . Chronic pain     Past Surgical History  Procedure Laterality Date  . Carpal tunnel release    . Bil knee surgery    . Mass excision N/A 06/29/2013    Procedure:  WIDE LOCAL EXCISION OF POSTERIOR NECK ABSCESS;  Surgeon: Ralene Ok, MD;  Location: [place];  Service: General;  Laterality: N/A;  . Cardiac catheterization    . Left heart catheterization with coronary angiogram N/A 07/27/2012    Procedure: LEFT HEART CATHETERIZATION WITH CORONARY ANGIOGRAM;  Surgeon: Sherren Mocha, MD;  Location: [facility] CATH LAB;  Service: Cardiovascular;   Laterality: N/A;    Current Medications: Outpatient Prescriptions Prior to Visit  Medication Sig Dispense Refill  . atorvastatin (LIPITOR) 20 MG tablet Take 1 tablet (20 mg total) by mouth daily. 90 tablet 3  . carvedilol (COREG) 12.5 MG tablet TAKE 1 TABLET BY MOUTH TWICE DAILY WITH A MEAL 60 tablet 5  . cetirizine (ZYRTEC) 10 MG tablet TAKE 1 TABLET BY MOUTH DAILY 30 tablet 11  . Cholecalciferol 50000 UNITS TABS Take 1 tablet by mouth once a week. For 12 weeks (Patient taking differently: Take 1 tablet by mouth once a week. For 12 weeks Wednesday) 12 tablet 0  . clotrimazole (LOTRIMIN) 1 % cream APPLY TOPICALLY 2 TIMES A DAY AS NEEDED FOR SKIN YEAST (Patient not taking: Reported on 02/28/2016) 60 g 2  . cyclobenzaprine (FLEXERIL) 10 MG tablet TAKE 1 TABLET BY MOUTH TWICE DAILY AS NEEDED FOR MUSCLE SPASMS. 30 tablet 2  . doxycycline (VIBRA-TABS) 100 MG tablet Take 1 tablet (100 mg total) by mouth 2 (two) times daily. 14 tablet 0  . fluconazole (DIFLUCAN) 150 MG tablet Take 1 tablet (150 mg total) by mouth once. May repeat in 3 days if still having symptoms. (Patient not taking: Reported on 02/28/2016) 2 tablet 2  . fluticasone (FLONASE) 50 MCG/ACT nasal spray Place 2 sprays  into both nostrils daily. 16 g 6  . gabapentin (NEURONTIN) 600 MG tablet Take 1.5 tablets in the morning, 1.5 tablets in the afternoon, and 2 tablets in the evening. 150 tablet 11  . HYDROcodone-acetaminophen (NORCO) 10-325 MG tablet Take 1 tablet by mouth every 8 (eight) hours as needed for severe pain. 90 tablet 0  . hydrocortisone cream 0.5 % Apply 1 application topically 2 (two) times daily as needed for itching. To feet (Patient not taking: Reported on 02/28/2016) 30 g 0  . ibuprofen (ADVIL,MOTRIN) 800 MG tablet Take 1 tablet (800 mg total) by mouth 3 (three) times daily with meals. (Patient not taking: Reported on 02/28/2016) 21 tablet 0  . Insulin Glargine (LANTUS SOLOSTAR) 100 UNIT/ML Solostar Pen Inject 700 Units into the  skin every morning. And pen needles 4/day 90 pen 11  . lisinopril (PRINIVIL,ZESTRIL) 10 MG tablet Take 1 tablet (10 mg total) by mouth daily. 30 tablet 5  . nitroGLYCERIN (NITROSTAT) 0.4 MG SL tablet DISSOLVE 1 TABLET UNDER THE TONGUE EVERY 5 MINUTES AS NEEDED FOR CHEST PAIN. 30 tablet 1  . ofloxacin (FLOXIN OTIC) 0.3 % otic solution Place 5 drops into the right ear 2 (two) times daily. For 5 days (Patient not taking: Reported on 02/28/2016) 10 mL 0  . ondansetron (ZOFRAN ODT) 4 MG disintegrating tablet Take 1 tablet (4 mg total) by mouth every 8 (eight) hours as needed for nausea. 10 tablet 0  . pantoprazole (PROTONIX) 40 MG tablet TAKE 1 TABLET BY MOUTH EVERY DAY 90 tablet 3  . pramipexole (MIRAPEX) 0.125 MG tablet TAKE 3 TABLETS BY MOUTH EVERY NIGHT AT BEDTIME 270 tablet 1  . PROCTOSOL HC 2.5 % rectal cream APPLY TO RECTAL AREA TWICE DAILY 28.35 g 0  . PROVENTIL HFA 108 (90 BASE) MCG/ACT inhaler INHALE 1 PUFF BY MOUTH EVERY 6 HOURS AS NEEDED FOR WHEEZING OR SHORTNESS OF BREATH. 6.7 g 11  . ranitidine (ZANTAC) 150 MG tablet Take 1 tablet (150 mg total) by mouth 2 (two) times daily. 60 tablet 1  . SYMBICORT 160-4.5 MCG/ACT inhaler INHALE 2 PUFFS INTO LUNGS TWICE A DAY 10.2 g 5  . topiramate (TOPAMAX) 25 MG tablet TAKE 1 TABLET BY MOUTH EVERY NIGHT AT BEDTIME 30 tablet 3  . VOLTAREN 1 % GEL APPLY 2 GRAMS TOPICALLY TO AFFECTED AREA TWICE DAILY 100 g 0   No facility-administered medications prior to visit.      Allergies:   Kiwi extract and Nubain   Social History   Social History  . Marital Status: Married    Spouse Name: N/A  . Number of Children: N/A  . Years of Education: N/A   Social History Main Topics  . Smoking status: Former Smoker -- 0.30 packs/day for .3 years    Types: Cigarettes    Quit date: 12/06/1993  . Smokeless tobacco: Never Used  . Alcohol Use: No  . Drug Use: No  . Sexual Activity:    Partners: Male   Other Topics Concern  . Not on file   Social History  Narrative   Lives in [place] with her fiance and 38 yr old dtr.     Family History:  The patient's family history includes Depression in her mother; Diabetes in her mother; Heart attack in her father, paternal grandfather, paternal grandmother, and paternal uncle; Heart disease in her paternal grandfather and paternal grandmother; Hyperlipidemia in her mother; Varicose Veins in her mother.   ROS:   Please see the history of present illness.  ROS All other systems reviewed and are negative.   Physical Exam:    VS:  LMP 04/10/2016   Physical Exam  Wt Readings from Last 3 Encounters:  04/20/16 297 lb 6.4 oz (134.9 kg)  03/15/16 296 lb 11.2 oz (134.582 kg)  02/20/16 301 lb (136.533 kg)      Studies/Labs Reviewed:     EKG:  EKG is  ordered today.  The ekg ordered today demonstrates   Recent Labs: 08/05/2015: TSH 1.895 11/23/2015: ALT 17 02/28/2016: BUN 15; Creatinine, Ser 0.63; Hemoglobin 13.9; Platelets 340; Potassium 4.1; Sodium 135   Recent Lipid Panel    Component Value Date/Time   CHOL 250* 11/15/2014 1148   TRIG 362.0* 11/15/2014 1148   HDL 36.90* 11/15/2014 1148   CHOLHDL 7 11/15/2014 1148   VLDL 72.4* 11/15/2014 1148   LDLCALC 195* 08/25/2012 0912   LDLDIRECT 164.9 11/15/2014 1148    Additional studies/ records that were reviewed today include:    [place] 8/13 Final Conclusions:  1. Widely patent coronary arteries 2. Normal LV systolic function 3. Elevated LVEDP  ASSESSMENT:     No diagnosis found.  PLAN:     In order of problems listed above:  1. Syncope  -   2. HTN -   3. HL -   4. Diabetes -   5. OSA -       Medication Adjustments/Labs and Tests Ordered: Current medicines are reviewed at length with the patient today.  Concerns regarding medicines are outlined above.  Medication changes, Labs and Tests ordered today are outlined in the Patient Instructions noted below. There are no Patient Instructions on file for this visit. Signed, Richardson Dopp, PA-C  05/07/2016 8:20 AM    [facility] [place], [place], [place]  [address] Phone: [phone]; Fax: [phone]     This encounter was created in error - please disregard.

## 2016-05-10 ENCOUNTER — Ambulatory Visit: Payer: Medicaid Other | Admitting: Endocrinology

## 2016-05-10 DIAGNOSIS — Z0289 Encounter for other administrative examinations: Secondary | ICD-10-CM

## 2016-05-20 ENCOUNTER — Encounter (HOSPITAL_BASED_OUTPATIENT_CLINIC_OR_DEPARTMENT_OTHER): Payer: Self-pay | Admitting: *Deleted

## 2016-05-20 ENCOUNTER — Telehealth: Payer: Self-pay | Admitting: Family Medicine

## 2016-05-20 NOTE — Pre-Procedure Instructions (Signed)
Alicia at Dr. Levell July office notified of BMI of 54; will not be able to have surgery at [facility].

## 2016-05-20 NOTE — Telephone Encounter (Signed)
Pt states her Ob gyn needs more authorization visits for [place]. Dr Dicky Doe at [place] is her dr.

## 2016-05-21 ENCOUNTER — Other Ambulatory Visit: Payer: Self-pay | Admitting: *Deleted

## 2016-05-26 ENCOUNTER — Encounter (HOSPITAL_COMMUNITY): Payer: Self-pay | Admitting: Vascular Surgery

## 2016-05-26 ENCOUNTER — Encounter (HOSPITAL_BASED_OUTPATIENT_CLINIC_OR_DEPARTMENT_OTHER): Payer: Self-pay | Admitting: *Deleted

## 2016-05-26 ENCOUNTER — Telehealth: Payer: Self-pay | Admitting: *Deleted

## 2016-05-26 ENCOUNTER — Inpatient Hospital Stay (HOSPITAL_COMMUNITY)
Admission: RE | Admit: 2016-05-26 | Discharge: 2016-05-26 | Disposition: A | Discharge status: Home / Self Care | Payer: Medicaid Other | Source: Ambulatory Visit

## 2016-05-26 MED ORDER — DEXTROSE 5 % IV SOLN: 3.0000 g | INTRAVENOUS | Status: DC

## 2016-05-26 MED ORDER — DEXTROSE 5 % IV SOLN
3.0000 g | INTRAVENOUS | Status: AC
  Filled 2016-05-26: qty 3000

## 2016-05-26 NOTE — Progress Notes (Signed)
Received page from [place], RN in presurgical testing regarding recommendations of DM medications on day of surgery. Ester, RN has talked with patient over the phone to verify her DM medications. Patient reported that she is taking Lantus 700 units QAM and patient has already taken Lantus 700 units today. Patient reports that her fasting glucose is usually 70-mid 100's mg/dl. In reviewing the chart, noted patient last seen Dr. Loanne Drilling on 12/09/2015 and per Dr. Cordelia Pen office note patient was asked to increase Lantus from 600 units QAM to 700 units QAM and A1C was 13.3% on 12/09/2015 indicating very poor glycemic control (13.3% indicate an average glucose of 335 mg/dl. Mirian Mo, [place] with Anesthesia to discuss patient. Patient is scheduled for right carpal tunnel release at 11:00 am on 05/27/16.  If patient still has poor diabetes control at this point, patient has increased risk of developing complications post-surgery.  MD may want to consider postponing surgery until patient improves glycemic control and advise patient to follow up with Endocrinologist as soon as possible. If MD still plans to do surgery, recommend patient only take Lantus 70 units in the morning (based on 140 kg x 0.5 units). Recommendations discussed with Ebony Hail, [place] and updated Ester, RN.  Thanks, Barnie Alderman, RN, MSN, CDE Diabetes Coordinator Inpatient Diabetes Program [phone] (Team Pager from [place] to [place]) [phone] ([facility] office) [phone] [facility] office) [phone] [facility] office)

## 2016-05-26 NOTE — Progress Notes (Signed)
error 

## 2016-05-26 NOTE — Progress Notes (Signed)
Anesthesia Chart Review:: SAME DAY WORK-UP.  Patient is a 40 year old female scheduled for right carpal tunnel release, possible hypothenar fat pad flap on 05/27/16 by Dr. Leanora Cover. Anesthesia is posted as [facility]. She was moved from [facility] due to BMI of 54. (I don't see that chart was reviewed otherwise with an anesthesiologist.)  History includes morbid obesity (BMI > 50), former smoker, asthma, HTN, GERD, depression, Bipolar disorder, DM2, arthritis, HLD, anemia, COPD, OSA with CPAP use, wide excision of posterior neck abscess '14, chronic pain with history of drug seeking behavior, syncope (3 episodes 02/2016, history of chest pain with widely patent coronaries by 2013 cath. In March 2017 she had three syncopal episodes. She was seen in the ED on 02/28/16 for recurrent syncope. She refused CTA to evaluate for PE. She had previously been seen by her PCP who initially thought episode was due to hypovolemia as orthostatics were mildly positive, but with recurrent episode out-patient cardiology referral with echo was planned. Patient has not followed through, however. She denied further syncopal episodes since March 2017.  - PCP is Dr. [name] with [facility], last visit 04/26/16. - Endocrinologist is Dr. Loanne Drilling, last visit 12/10/15. He increased her from 600 Units to 700 Units of Lantus every morning due to CBG range of 125-500 with A1c 13.3 which correlates with average readings > 300 (up from 12.9 on 08/05/15).  - She has been evaluated by cardiologist Dr. Burt Knack in the past, last visit 03/27/14. She was scheduled for re-evaluation following a syncopal episode in 02/2016 but it appears she either no-showed or canceled her appointments for an office visit and echo.  02/28/16 EKG: ST at 113 bpm, rightward axis, low voltage QRS, cannot rule out anterior infarct (age undetermined).No significant change since last tracing.   07/27/12 [facility] (Dr. Sherren Mocha): Final Conclusions:  1.  Widely patent coronary arteries 2. Normal LV systolic function 3. Elevated LVEDP Recommendations: The patient likely has noncardiac chest pain. She does have elevated filling pressures and will require ongoing aggressive risk reduction with treatment of her hypertension, diabetes, and efforts at weight loss.  She had an unremarkable echo in 2004.  07/12/14 CTA head/neck: IMPRESSION: - No extracranial cause for amaurosis is observed. No dissection or atheromatous change at the carotid bifurcations. No evidence for dissection. -No intracranial flow reducing lesion is observed. There is some calcific atheromatous change near the LEFT ICA ophthalmic origin but none is present on the RIGHT. Symmetric and unremarkable BILATERAL ophthalmic arteries. - Moderately advanced sinus disease, LEFT greater than RIGHT, most notably LEFT maxillary sinus.  02/28/16 1V CXR: IMPRESSION: No active cardiopulmonary disease.  She will need updated labs before surgery. She reports fasting CBGs ~ 70-100. DME RN Lelan Pons was consulted for recommendations of day of surgery insulin dose (since patient is on an exceptional high dose with low fasting CBG). There was concern that patient may not actually be absorbing that amount of insulin. She suggested 70 Units based on her weight then could resume her usual dose post-operatively. Of note, in January, Dr. Loanne Drilling recommended two month follow-up, so she is overdue for endocrinology follow-up.   Discussed above with anesthesiologist Dr. Gifford Shave. Patient has not followed through with her cardiology referral and echo. Recommend these be done prior to an elective surgery. Also notify Dr. Fredna Dow of history of poorly controlled DM as he may want patient to follow-up with endocrinology as well. Reportedly, she is not having issues with fasting hyperglycemia, but a fasting  CBG of > 200 could cancel her procedure as well. This was discussed with Jeani Hawking at Dr. Levell July office. Plan to cancel  surgery for tomorrow.  [facility]/Anesthesiology Phone [phone] 05/26/2016 12:10 PM

## 2016-05-27 ENCOUNTER — Ambulatory Visit (HOSPITAL_BASED_OUTPATIENT_CLINIC_OR_DEPARTMENT_OTHER): Admission: RE | Admit: 2016-05-27 | Payer: Medicaid Other | Source: Ambulatory Visit | Admitting: Orthopedic Surgery

## 2016-05-27 ENCOUNTER — Other Ambulatory Visit: Payer: Self-pay | Admitting: *Deleted

## 2016-05-27 HISTORY — DX: Pneumonia, unspecified organism: J18.9

## 2016-05-27 HISTORY — DX: Carpal tunnel syndrome, right upper limb: G56.01

## 2016-05-27 SURGERY — CARPAL TUNNEL RELEASE
Anesthesia: Choice | Laterality: Right

## 2016-05-28 ENCOUNTER — Other Ambulatory Visit (HOSPITAL_COMMUNITY): Payer: Medicaid Other

## 2016-05-28 ENCOUNTER — Ambulatory Visit (HOSPITAL_COMMUNITY): Payer: Medicaid Other | Attending: Cardiovascular Disease

## 2016-05-28 ENCOUNTER — Other Ambulatory Visit: Payer: Self-pay

## 2016-05-28 DIAGNOSIS — R55 Syncope and collapse: Secondary | ICD-10-CM

## 2016-05-28 DIAGNOSIS — E785 Hyperlipidemia, unspecified: Secondary | ICD-10-CM | POA: Insufficient documentation

## 2016-05-28 DIAGNOSIS — Z6841 Body Mass Index (BMI) 40.0 and over, adult: Secondary | ICD-10-CM | POA: Insufficient documentation

## 2016-05-28 DIAGNOSIS — E119 Type 2 diabetes mellitus without complications: Secondary | ICD-10-CM | POA: Diagnosis not present

## 2016-05-28 DIAGNOSIS — I119 Hypertensive heart disease without heart failure: Secondary | ICD-10-CM | POA: Diagnosis not present

## 2016-05-28 LAB — ECHOCARDIOGRAM COMPLETE
AVLVOTPG: 2 mmHg
CHL CUP DOP CALC LVOT VTI: 11.4 cm
CHL CUP MV DEC (S): 190
E/e' ratio: 11.4
EWDT: 190 ms
FS: 40 % (ref 28–44)
IVS/LV PW RATIO, ED: 0.92
LA ID, A-P, ES: 38 mm
LA diam end sys: 38 mm
LA vol A4C: 24 ml
LA vol: 29 mL
LADIAMINDEX: 1.68 cm/m2
LAVOLIN: 12.8 mL/m2
LV E/e' medial: 11.4
LVEEAVG: 11.4
LVELAT: 6.36 cm/s
LVOT SV: 36 mL
LVOT area: 3.14 cm2
LVOT diameter: 20 mm
LVOT peak vel: 66.9 cm/s
MV Peak grad: 2 mmHg
MVPKAVEL: 70.2 m/s
MVPKEVEL: 72.5 m/s
PW: 12.5 mm — AB (ref 0.6–1.1)
TDI e' lateral: 6.36
TDI e' medial: 8.11

## 2016-06-02 NOTE — Telephone Encounter (Signed)
Tia, Do I need to enter another referral for this or can you just authorize more visits with them? Thanks [name], MD

## 2016-06-03 NOTE — Telephone Encounter (Signed)
12 Additional visits authorized for patient. No referral necessary.

## 2016-06-04 ENCOUNTER — Encounter: Payer: Self-pay | Admitting: Family Medicine

## 2016-06-09 ENCOUNTER — Ambulatory Visit: Payer: Medicaid Other | Admitting: Physician Assistant

## 2016-06-09 ENCOUNTER — Other Ambulatory Visit: Payer: Self-pay | Admitting: Family Medicine

## 2016-06-10 ENCOUNTER — Ambulatory Visit (INDEPENDENT_AMBULATORY_CARE_PROVIDER_SITE_OTHER): Payer: Medicaid Other | Admitting: Cardiovascular Disease

## 2016-06-10 ENCOUNTER — Encounter: Payer: Self-pay | Admitting: Cardiovascular Disease

## 2016-06-10 VITALS — BP 110/80 | HR 108 | Ht 62.0 in | Wt 300.1 lb

## 2016-06-10 DIAGNOSIS — R55 Syncope and collapse: Secondary | ICD-10-CM

## 2016-06-10 DIAGNOSIS — L089 Local infection of the skin and subcutaneous tissue, unspecified: Secondary | ICD-10-CM

## 2016-06-10 DIAGNOSIS — I10 Essential (primary) hypertension: Secondary | ICD-10-CM | POA: Diagnosis not present

## 2016-06-10 DIAGNOSIS — Z7189 Other specified counseling: Secondary | ICD-10-CM

## 2016-06-10 DIAGNOSIS — R51 Headache: Secondary | ICD-10-CM

## 2016-06-10 DIAGNOSIS — G2581 Restless legs syndrome: Secondary | ICD-10-CM | POA: Diagnosis not present

## 2016-06-10 MED ORDER — BUDESONIDE-FORMOTEROL FUMARATE 160-4.5 MCG/ACT IN AERO: INHALATION_SPRAY | RESPIRATORY_TRACT | Status: DC

## 2016-06-10 NOTE — Patient Instructions (Signed)
Medication Instructions:  Your physician recommends that you continue on your current medications as directed. Please refer to the Current Medication list given to you today.  Labwork: No new orders.   Testing/Procedures: No new orders.   Follow-Up: Your physician recommends that you schedule a follow-up appointment as needed.   You are cleared for carpal tunnel surgery.    Any Other Special Instructions Will Be Listed Below (If Applicable).     If you need a refill on your cardiac medications before your next appointment, please call your pharmacy.

## 2016-06-10 NOTE — Progress Notes (Signed)
Cardiology Office Note Date:  06/10/2016   ID:  [name], DOB 02-Jan-1976, MRN [id number]  PCP:  Belinda Netters, MD  Cardiologist:  Sherren Mocha, MD    Chief Complaint  Patient presents with  . Essential Hypertension  . syncope     History of Present Illness: [name] is a 40 y.o. female who presents for Cardiology follow-up evaluation. She was last seen here in 2015. The patient had recurrent chest pain and underwent cardiac catheterization in 2013 demonstrating normal coronary arteries. She has multiple ongoing cardiovascular risk factors including hypertension, uncontrolled diabetes, hyperlipidemia, and morbid obesity.  The patient had syncope a few months back. She was sitting on her couch when symptoms occurred. She states there was no prodrome but does not recall the event well. She did not injure herself. There was no associated seizure-like activity, loss of bowel or bladder, chest pain, shortness of breath, diaphoresis, nausea, or vomiting. There is been no recurrence. She denies chest pain or pressure. Denies shortness of breath. She has not been physically active or engaged in any regular exercise. She's having a lot of problems with sleep. States that she stays up all my much of the time. She is only eating 1 meal per day. Her last hemoglobin A1c was over 13 mg/dL.   Past Medical History  Diagnosis Date  . Morbid obesity ([name])   . Alveolar hypoventilation   . Asthma   . GERD (gastroesophageal reflux disease)   . Rectal fissure   . Hypertension   . Costochondritis   . Cellulitis 08/2010-08/2011  . Depression   . Bipolar 2 disorder ([place])   . Obstructive sleep apnea   . Chest pain     NO CAD by cath [id number], nl LV systolic fxn  . Diabetes mellitus 2000    Type 2, Uncontrolled  . Arthritis   . HLD (hyperlipidemia)   . Anemia   . COPD (chronic obstructive pulmonary disease) ([place])   . Varicose veins     Right medial thigh and Left leg   . Drug-seeking  behavior   . Chronic pain   . Pneumonia     as a baby  . Syncope     3/ 2017  . Carpal tunnel syndrome on right     recurrent    Past Surgical History  Procedure Laterality Date  . Carpal tunnel release    . Mass excision N/A 06/29/2013    Procedure:  WIDE LOCAL EXCISION OF POSTERIOR NECK ABSCESS;  Surgeon: Ralene Ok, MD;  Location: [place];  Service: General;  Laterality: N/A;  . Left heart catheterization with coronary angiogram N/A 07/27/2012    Procedure: LEFT HEART CATHETERIZATION WITH CORONARY ANGIOGRAM;  Surgeon: Sherren Mocha, MD;  Location: [facility] CATH LAB;  Service: Cardiovascular;  Laterality: N/A;  . Colonoscopy with propofol  11/02/2011  . Knee arthroscopy Right 07/17/2010    Current Outpatient Prescriptions  Medication Sig Dispense Refill  . ACCU-CHEK SMARTVIEW test strip USE TO TEST BLOOD SUGAR FOUR TIMES DAILY 100 each 11  . atorvastatin (LIPITOR) 20 MG tablet Take 1 tablet (20 mg total) by mouth daily. 90 tablet 3  . budesonide-formoterol (SYMBICORT) 160-4.5 MCG/ACT inhaler INHALE 2 PUFFS INTO LUNGS TWICE A DAY 10.2 g 5  . carvedilol (COREG) 12.5 MG tablet TAKE 1 TABLET BY MOUTH TWICE DAILY WITH A MEAL 60 tablet 5  . cetirizine (ZYRTEC) 10 MG tablet TAKE 1 TABLET BY MOUTH DAILY 30 tablet 11  . cyclobenzaprine (FLEXERIL) 10 MG tablet  TAKE 1 TABLET BY MOUTH TWICE DAILY AS NEEDED FOR MUSCLE SPASMS. 30 tablet 2  . fluticasone (FLONASE) 50 MCG/ACT nasal spray Place 2 sprays into both nostrils daily. 16 g 6  . gabapentin (NEURONTIN) 600 MG tablet Take 600-900 mg by mouth 3 (three) times daily. Take 1.5 tablets by mouth in the morning and afternoon and take 2 tablets by mouth in the evening    . HYDROcodone-acetaminophen (NORCO) 10-325 MG tablet Take 1 tablet by mouth every 8 (eight) hours as needed for severe pain. 90 tablet 0  . Insulin Glargine (LANTUS SOLOSTAR) 100 UNIT/ML Solostar Pen Inject 700 Units into the skin every morning. And pen needles 4/day 90 pen 11  .  lisinopril (PRINIVIL,ZESTRIL) 10 MG tablet Take 1 tablet (10 mg total) by mouth daily. 30 tablet 5  . nitroGLYCERIN (NITROSTAT) 0.4 MG SL tablet DISSOLVE 1 TABLET UNDER THE TONGUE EVERY 5 MINUTES AS NEEDED FOR CHEST PAIN. 30 tablet 1  . ondansetron (ZOFRAN ODT) 4 MG disintegrating tablet Take 1 tablet (4 mg total) by mouth every 8 (eight) hours as needed for nausea. 10 tablet 0  . pantoprazole (PROTONIX) 40 MG tablet TAKE 1 TABLET BY MOUTH EVERY DAY 90 tablet 3  . pramipexole (MIRAPEX) 0.125 MG tablet TAKE 3 TABLETS BY MOUTH EVERY NIGHT AT BEDTIME 270 tablet 1  . PROVENTIL HFA 108 (90 BASE) MCG/ACT inhaler INHALE 1 PUFF BY MOUTH EVERY 6 HOURS AS NEEDED FOR WHEEZING OR SHORTNESS OF BREATH. 6.7 g 11  . ranitidine (ZANTAC) 150 MG tablet Take 1 tablet (150 mg total) by mouth 2 (two) times daily. 60 tablet 1  . topiramate (TOPAMAX) 25 MG tablet TAKE 1 TABLET BY MOUTH EVERY NIGHT AT BEDTIME 30 tablet 3   No current facility-administered medications for this visit.    Allergies:   Kiwi extract and Nubain   Social History:  The patient  reports that she quit smoking about 22 years ago. Her smoking use included Cigarettes. She has a .09 pack-year smoking history. She has never used smokeless tobacco. She reports that she does not drink alcohol or use illicit drugs.   Family History:  The patient's  family history includes Depression in her mother; Diabetes in her mother; Heart attack in her father, paternal grandfather, paternal grandmother, and paternal uncle; Heart disease in her paternal grandfather and paternal grandmother; Hyperlipidemia in her mother; Varicose Veins in her mother.    ROS:  Please see the history of present illness.  Otherwise, review of systems is positive for depression, fatigue, leg pain, snoring, anxiety.  All other systems are reviewed and negative.    PHYSICAL EXAM: VS:  BP 110/80 mmHg  Pulse 108  Ht 5\' 2"  (1.575 m)  Wt 300 lb 1.9 oz (136.134 kg)  BMI 54.88 kg/m2 , BMI  Body mass index is 54.88 kg/(m^2). GEN: Well nourished, well developed, morbidly obese woman in no acute distress HEENT: normal Neck: Unable to visualized junk venous pressure secondary to body habitus, no masses. No carotid bruits Cardiac: RRR without murmur or gallop                Respiratory:  clear to auscultation bilaterally, normal work of breathing GI: soft, nontender, nondistended, + BS MS: no deformity or atrophy Ext: Trace bilateral pretibial edema, pedal pulses 2+= bilaterally Skin: warm and dry, no rash Neuro:  Strength and sensation are intact Psych: euthymic mood, full affect  EKG:  EKG is ordered today. The ekg ordered today shows sinus tachycardia 101  bpm, rightward axis, low-voltage QRS, cannot rule out anterior infarct age undetermined  Recent Labs: 08/05/2015: TSH 1.895 11/23/2015: ALT 17 02/28/2016: BUN 15; Creatinine, Ser 0.63; Hemoglobin 13.9; Platelets 340; Potassium 4.1; Sodium 135   Lipid Panel     Component Value Date/Time   CHOL 250* 11/15/2014 1148   TRIG 362.0* 11/15/2014 1148   HDL 36.90* 11/15/2014 1148   CHOLHDL 7 11/15/2014 1148   VLDL 72.4* 11/15/2014 1148   LDLCALC 195* 08/25/2012 0912   LDLDIRECT 164.9 11/15/2014 1148      Wt Readings from Last 3 Encounters:  06/10/16 300 lb 1.9 oz (136.134 kg)  05/20/16 296 lb (134.265 kg)  04/20/16 297 lb 6.4 oz (134.9 kg)     Cardiac Studies Reviewed: 2D Echo: Study Conclusions  - Left ventricle: The cavity size was mildly dilated. Wall  thickness was increased in a pattern of mild LVH. Systolic  function was normal. The estimated ejection fraction was in the  range of 55% to 60%. Wall motion was normal; there were no  regional wall motion abnormalities. Left ventricular diastolic  function parameters were normal. - Left atrium: The atrium was mildly dilated. - Atrial septum: No defect or patent foramen ovale was identified.  ASSESSMENT AND PLAN: 1.  Syncope, unclear etiology: The  patient has had no recurrence. Her LV function is normal. She is known to have normal coronary arteries at catheterization a few years back. Recommended. Observation without further workup at this time.  2. Uncontrolled diabetes: Lengthy discussion with the patient. She understands her need for weight loss, better sleep hygiene, dietary modification, and close medical follow-up.  3. Essential hypertension: Blood pressure is controlled on current medical therapy.  4. Hyperlipidemia: Treated with a statin drug. Labs followed by PCP.  Current medicines are reviewed with the patient today.  The patient does not have concerns regarding medicines.  Labs/ tests ordered today include:  Orders Placed This Encounter  Procedures  . EKG 12-Lead    Disposition:   FU prn  Signed, Sherren Mocha, MD  06/10/2016 1:55 PM    [facility] [place], [place], [place]  [address] Phone: [phone]; Fax: [phone]

## 2016-06-17 ENCOUNTER — Telehealth: Payer: Self-pay | Admitting: Family Medicine

## 2016-06-17 NOTE — Telephone Encounter (Signed)
Pt called and wants to know can we call the pharmacy so that she can pick up her pain medication today instead of the 16th. She said she wants to go out of town today and can not be her when it is time to pick up. Please let patient know if she can pick this up or not. jw

## 2016-06-17 NOTE — Telephone Encounter (Signed)
Will forward to covering MD.

## 2016-06-17 NOTE — Telephone Encounter (Signed)
Nursing staff - please call pharmacy and give OK to prescribe medication early, please inform patient of this and that this will only be a one time thing.

## 2016-06-18 ENCOUNTER — Other Ambulatory Visit: Payer: Self-pay | Admitting: Orthopedic Surgery

## 2016-06-18 NOTE — Telephone Encounter (Signed)
Spoke with patient, called [facility] on [address] to authorize early refill.

## 2016-06-26 ENCOUNTER — Encounter (HOSPITAL_COMMUNITY): Payer: Self-pay

## 2016-06-26 ENCOUNTER — Emergency Department (HOSPITAL_COMMUNITY)
Admission: EM | Admit: 2016-06-26 | Discharge: 2016-06-26 | Disposition: A | Discharge status: Home / Self Care | Payer: Medicaid Other | Attending: Emergency Medicine | Admitting: Emergency Medicine

## 2016-06-26 ENCOUNTER — Emergency Department (HOSPITAL_COMMUNITY): Payer: Medicaid Other

## 2016-06-26 DIAGNOSIS — Z794 Long term (current) use of insulin: Secondary | ICD-10-CM | POA: Diagnosis not present

## 2016-06-26 DIAGNOSIS — I10 Essential (primary) hypertension: Secondary | ICD-10-CM | POA: Diagnosis not present

## 2016-06-26 DIAGNOSIS — M79605 Pain in left leg: Secondary | ICD-10-CM | POA: Insufficient documentation

## 2016-06-26 DIAGNOSIS — J449 Chronic obstructive pulmonary disease, unspecified: Secondary | ICD-10-CM | POA: Diagnosis not present

## 2016-06-26 DIAGNOSIS — Z96651 Presence of right artificial knee joint: Secondary | ICD-10-CM | POA: Diagnosis not present

## 2016-06-26 DIAGNOSIS — Z79899 Other long term (current) drug therapy: Secondary | ICD-10-CM | POA: Insufficient documentation

## 2016-06-26 DIAGNOSIS — R0789 Other chest pain: Secondary | ICD-10-CM | POA: Insufficient documentation

## 2016-06-26 DIAGNOSIS — M79604 Pain in right leg: Secondary | ICD-10-CM | POA: Diagnosis not present

## 2016-06-26 DIAGNOSIS — E1165 Type 2 diabetes mellitus with hyperglycemia: Secondary | ICD-10-CM | POA: Diagnosis not present

## 2016-06-26 DIAGNOSIS — Z87891 Personal history of nicotine dependence: Secondary | ICD-10-CM | POA: Diagnosis not present

## 2016-06-26 DIAGNOSIS — R079 Chest pain, unspecified: Secondary | ICD-10-CM | POA: Diagnosis present

## 2016-06-26 LAB — BASIC METABOLIC PANEL
ANION GAP: 8 (ref 5–15)
BUN: 16 mg/dL (ref 6–20)
CALCIUM: 9.2 mg/dL (ref 8.9–10.3)
CO2: 26 mmol/L (ref 22–32)
CREATININE: 0.67 mg/dL (ref 0.44–1.00)
Chloride: 98 mmol/L — ABNORMAL LOW (ref 101–111)
Glucose, Bld: 366 mg/dL — ABNORMAL HIGH (ref 65–99)
Potassium: 4.4 mmol/L (ref 3.5–5.1)
SODIUM: 132 mmol/L — AB (ref 135–145)

## 2016-06-26 LAB — CBC
HCT: 38.7 % (ref 36.0–46.0)
HEMOGLOBIN: 13 g/dL (ref 12.0–15.0)
MCH: 29.5 pg (ref 26.0–34.0)
MCHC: 33.6 g/dL (ref 30.0–36.0)
MCV: 87.8 fL (ref 78.0–100.0)
PLATELETS: 294 10*3/uL (ref 150–400)
RBC: 4.41 MIL/uL (ref 3.87–5.11)
RDW: 12.4 % (ref 11.5–15.5)
WBC: 10.3 10*3/uL (ref 4.0–10.5)

## 2016-06-26 LAB — HEPATIC FUNCTION PANEL
ALK PHOS: 102 U/L (ref 38–126)
ALT: 16 U/L (ref 14–54)
AST: 13 U/L — ABNORMAL LOW (ref 15–41)
Albumin: 2.7 g/dL — ABNORMAL LOW (ref 3.5–5.0)
TOTAL PROTEIN: 6.2 g/dL — AB (ref 6.5–8.1)
Total Bilirubin: 0.3 mg/dL (ref 0.3–1.2)

## 2016-06-26 LAB — I-STAT TROPONIN, ED: TROPONIN I, POC: 0.01 ng/mL (ref 0.00–0.08)

## 2016-06-26 LAB — CBG MONITORING, ED: Glucose-Capillary: 326 mg/dL — ABNORMAL HIGH (ref 65–99)

## 2016-06-26 LAB — CK: CK TOTAL: 51 U/L (ref 38–234)

## 2016-06-26 MED ORDER — KETOROLAC TROMETHAMINE 30 MG/ML IJ SOLN
30.0000 mg | Freq: Once | INTRAMUSCULAR | Status: AC
  Administered 2016-06-26: 30 mg via INTRAVENOUS
  Filled 2016-06-26: qty 1

## 2016-06-26 MED ORDER — NAPROXEN 500 MG PO TABS: 500.0000 mg | ORAL_TABLET | Freq: Two times a day (BID) | ORAL | Status: DC

## 2016-06-26 MED ORDER — LORAZEPAM 2 MG/ML IJ SOLN
1.0000 mg | Freq: Once | INTRAMUSCULAR | Status: AC
  Administered 2016-06-26: 1 mg via INTRAVENOUS
  Filled 2016-06-26: qty 1

## 2016-06-26 MED ORDER — MORPHINE SULFATE (PF) 4 MG/ML IV SOLN
4.0000 mg | Freq: Once | INTRAVENOUS | Status: AC
  Administered 2016-06-26: 4 mg via INTRAVENOUS
  Filled 2016-06-26: qty 1

## 2016-06-26 MED ORDER — CO-ENZYME Q-10 50 MG PO CAPS: 100.0000 mg | ORAL_CAPSULE | Freq: Every day | ORAL | Status: DC

## 2016-06-26 MED ORDER — SODIUM CHLORIDE 0.9 % IV BOLUS (SEPSIS)
1000.0000 mL | Freq: Once | INTRAVENOUS | Status: AC
  Administered 2016-06-26: 1000 mL via INTRAVENOUS

## 2016-06-26 NOTE — ED Notes (Signed)
Pt states she doesn't know how she will be getting home.

## 2016-06-26 NOTE — ED Provider Notes (Signed)
TIME SEEN: 5:00 AM  CHIEF COMPLAINT: Chest pain, leg pain  HPI: Pt is a 40 y.o. female with history of hypertension, hyperlipidemia, diabetes, COPD, chronic pain, documented history of drug-seeking behavior who presents emergency department with central chest pain that started at 1:30 AM while at rest that she describes as a dull pain without radiation. It is worse with palpation and movement. It is not exertional or pleuritic. Denies associated shortness of breath, nausea or vomiting, diaphoresis. Did have some lightheadedness. Denies fevers or cough. Reports she's had similar chest pain in the past and states she was told it was not life-threatening.  No history of PE, DVT, exogenous estrogen use, fracture, surgery, trauma, hospitalization, prolonged travel. No lower extremity swelling or pain. No calf tenderness.    ROS: See HPI Constitutional: no fever  Eyes: no drainage  ENT: no runny nose   Cardiovascular:  no chest pain  Resp: no SOB  GI: no vomiting GU: no dysuria Integumentary: no rash  Allergy: no hives  Musculoskeletal: no leg swelling  Neurological: no slurred speech ROS otherwise negative  PAST MEDICAL HISTORY/PAST SURGICAL HISTORY:  Past Medical History  Diagnosis Date  . Morbid obesity ([place])   . Alveolar hypoventilation   . Asthma   . GERD (gastroesophageal reflux disease)   . Rectal fissure   . Hypertension   . Costochondritis   . Cellulitis 08/2010-08/2011  . Depression   . Bipolar 2 disorder ([place])   . Obstructive sleep apnea   . Chest pain     NO CAD by cath [id number], nl LV systolic fxn  . Diabetes mellitus 2000    Type 2, Uncontrolled  . Arthritis   . HLD (hyperlipidemia)   . Anemia   . COPD (chronic obstructive pulmonary disease) ([place])   . Varicose veins     Right medial thigh and Left leg   . Drug-seeking behavior   . Chronic pain   . Pneumonia     as a baby  . Syncope     3/ 2017  . Carpal tunnel syndrome on right     recurrent     MEDICATIONS:  Prior to Admission medications   Medication Sig Start Date End Date Taking? Authorizing Provider  ACCU-CHEK SMARTVIEW test strip USE TO TEST BLOOD SUGAR FOUR TIMES DAILY 06/10/16   [name], MD  atorvastatin (LIPITOR) 20 MG tablet Take 1 tablet (20 mg total) by mouth daily. 03/22/16   [name], MD  budesonide-formoterol [place]) 160-4.5 MCG/ACT inhaler INHALE 2 PUFFS INTO LUNGS TWICE A DAY 06/10/16   [name], MD  carvedilol (COREG) 12.5 MG tablet TAKE 1 TABLET BY MOUTH TWICE DAILY WITH A MEAL 08/12/15   [name], MD  cetirizine (ZYRTEC) 10 MG tablet TAKE 1 TABLET BY MOUTH DAILY 07/16/15   [name], MD  cyclobenzaprine (FLEXERIL) 10 MG tablet TAKE 1 TABLET BY MOUTH TWICE DAILY AS NEEDED FOR MUSCLE SPASMS. 04/29/16   [name], MD  fluticasone (FLONASE) 50 MCG/ACT nasal spray Place 2 sprays into both nostrils daily. 05/21/14   Melony Overly, MD  gabapentin (NEURONTIN) 600 MG tablet Take 600-900 mg by mouth 3 (three) times daily. Take 1.5 tablets by mouth in the morning and afternoon and take 2 tablets by mouth in the evening    Historical Provider, MD  HYDROcodone-acetaminophen (NORCO) 10-325 MG tablet Take 1 tablet by mouth every 8 (eight) hours as needed for severe pain. 04/20/16   [name], MD  Insulin Glargine (LANTUS SOLOSTAR) 100 UNIT/ML Solostar Pen Inject 700 Units into the skin every morning. And pen needles 4/day 12/09/15   [name], MD  lisinopril (PRINIVIL,ZESTRIL) 10 MG tablet Take 1 tablet (10 mg total) by mouth daily. 03/12/14   [name], MD  nitroGLYCERIN (NITROSTAT) 0.4 MG SL tablet DISSOLVE 1 TABLET UNDER THE TONGUE EVERY 5 MINUTES AS NEEDED FOR CHEST PAIN. 10/21/15   [name], MD  ondansetron (ZOFRAN ODT) 4 MG disintegrating tablet Take 1 tablet (4 mg total) by mouth every 8 (eight) hours as needed for nausea. 03/15/16   [name], MD  pantoprazole (PROTONIX) 40 MG  tablet TAKE 1 TABLET BY MOUTH EVERY DAY 09/15/15   [name], MD  pramipexole (MIRAPEX) 0.125 MG tablet TAKE 3 TABLETS BY MOUTH EVERY NIGHT AT BEDTIME 11/13/15   [name], MD  PROVENTIL HFA 108 (90 BASE) MCG/ACT inhaler INHALE 1 PUFF BY MOUTH EVERY 6 HOURS AS NEEDED FOR WHEEZING OR SHORTNESS OF BREATH. 07/16/15   [name], MD  ranitidine (ZANTAC) 150 MG tablet Take 1 tablet (150 mg total) by mouth 2 (two) times daily. 03/15/16 03/15/17  [name], MD  topiramate (TOPAMAX) 25 MG tablet TAKE 1 TABLET BY MOUTH EVERY NIGHT AT BEDTIME 09/15/15   [name], MD    ALLERGIES:  Allergies  Allergen Reactions  . Kiwi Extract Shortness Of Breath  . Nubain [Nalbuphine Hcl] Other (See Comments)    "FEELS LIKE SOMETHING CRAWLING ON ME"    SOCIAL HISTORY:  Social History  Substance Use Topics  . Smoking status: Former Smoker -- 0.30 packs/day for .3 years    Types: Cigarettes    Quit date: 12/06/1993  . Smokeless tobacco: Never Used  . Alcohol Use: No    FAMILY HISTORY: Family History  Problem Relation Age of Onset  . Diabetes Mother   . Hyperlipidemia Mother   . Depression Mother   . Varicose Veins Mother   . Heart attack Paternal Uncle   . Heart disease Paternal Grandmother   . Heart attack Paternal Grandmother   . Heart attack Paternal Grandfather   . Heart disease Paternal Grandfather   . Heart attack Father     EXAM: BP 119/72 mmHg  Pulse 87  Temp(Src) 97.8 F (36.6 C)  Resp 17  SpO2 99% CONSTITUTIONAL: Alert and oriented and responds appropriately to questions. Well-appearing; well-nourished, Obese HEAD: Normocephalic EYES: Conjunctivae clear, PERRL ENT: normal nose; no rhinorrhea; moist mucous membranes NECK: Supple, no meningismus, no LAD  CARD: RRR; S1 and S2 appreciated; no murmurs, no clicks, no rubs, no gallops CHEST:  Chest is tender to palpation in the center of the chest without crepitus, ecchymosis or deformity. No  rash or other lesions. Palpation of her chest wall reproduces her pain. RESP: Normal chest excursion without splinting or tachypnea; breath sounds clear and equal bilaterally; no wheezes, no rhonchi, no rales, no hypoxia or respiratory distress, speaking full sentences ABD/GI: Normal bowel sounds; non-distended; soft, non-tender, no rebound, no guarding, no peritoneal signs BACK:  The back appears normal and is non-tender to palpation, there is no CVA tenderness EXT: Tender to palpation from the knee down bilaterally without bony deformity, erythema or warmth. Compartments are soft. No joint effusion. 2+ DP pulses bilaterally. Normal ROM in all joints; extremities are non-tender to palpation; no edema; normal capillary refill; no cyanosis, no calf tenderness or swelling    SKIN: Normal color for age and race; warm; no  rash NEURO: Moves all extremities equally, sensation to light touch intact diffusely, cranial nerves II through XII intact PSYCH: The patient's mood and manner are appropriate. Grooming and personal hygiene are appropriate.  MEDICAL DECISION MAKING: Patient here with atypical chest pain has been present for several hours. Suspect chest wall pain. EKG shows no ischemic abnormality. Patient also complaining of bilateral leg pain. Has history of restless leg but states this feels worse. No sign of cellulitis, septic arthritis, gout on exam. Neurovascular intact distally. Low suspicion for DVT as patient has no risk factors and is not tachycardic, hypoxic, tachypneic. We'll treat symptomatically with Toradol, IV fluids, Ativan and reassess. Patient is on a statin. Discussed with her that this could be causing some of her symptoms. Have recommend she add on coenzyme Q10.  ED PROGRESS: Patient's labs are unremarkable including negative troponin. Chest x-ray clear. Patient is hyperglycemic but this is improving with IV hydration. She is not in DKA.  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 We'll have her follow-up with her PCP. We'll have her continue her Lipitor at this time. I do not feel she needs further emergent workup. Have low suspicion for PE, DVT. Doubt dissection. Doubt that this is ACS. Chest pain seems to be musculoskeletal in nature.   At this time, I do not feel there is any life-threatening condition present. I have reviewed and discussed all results (EKG, imaging, lab, urine as appropriate), exam findings with patient. I have reviewed nursing notes and appropriate previous records.  I feel the patient is safe to be discharged home without further emergent workup. Discussed usual and customary return precautions. Patient and family (if present) verbalize understanding and are comfortable with this plan.  Patient will follow-up with their primary care provider. If they do not have a primary care provider, information for follow-up has been provided to them. All questions have been answered.     EKG Interpretation  Date/Time:  Saturday June 26 2016 04:44:20 EDT Ventricular Rate:  89 PR Interval:  162 QRS Duration: 90 QT Interval:  360 QTC Calculation: 438 R Axis:   119 Text Interpretation:  Normal sinus rhythm Right axis deviation Low voltage QRS Cannot rule out Anterior infarct , age undetermined Abnormal ECG No significant change since last tracing Confirmed by WARD,  DO, KRISTEN [id number]) on 06/26/2016 4:50:40 AM        [place], DO 06/26/16 [id number]

## 2016-06-26 NOTE — Discharge Instructions (Signed)
If your leg cramps, pain continues, I recommend he follow-up with her primary care physician to discuss whether or not this could be related to your Lipitor. I am prescribing a medication called coenzyme Q10 which may help with your symptoms if this is related to Lipitor. Your labs, chest x-ray, EKG today were normal.   Chest Wall Pain Chest wall pain is pain in or around the bones and muscles of your chest. Sometimes, an injury causes this pain. Sometimes, the cause may not be known. This pain may take several weeks or longer to get better. HOME CARE INSTRUCTIONS  Pay attention to any changes in your symptoms. Take these actions to help with your pain:   Rest as told by your health care provider.   Avoid activities that cause pain. These include any activities that use your chest muscles or your abdominal and side muscles to lift heavy items.   If directed, apply ice to the painful area:  Put ice in a plastic bag.  Place a towel between your skin and the bag.  Leave the ice on for 20 minutes, 2-3 times per day.  Take over-the-counter and prescription medicines only as told by your health care provider.  Do not use tobacco products, including cigarettes, chewing tobacco, and e-cigarettes. If you need help quitting, ask your health care provider.  Keep all follow-up visits as told by your health care provider. This is important. SEEK MEDICAL CARE IF:  You have a fever.  Your chest pain becomes worse.  You have new symptoms. SEEK IMMEDIATE MEDICAL CARE IF:  You have nausea or vomiting.  You feel sweaty or light-headed.  You have a cough with phlegm (sputum) or you cough up blood.  You develop shortness of breath.   This information is not intended to replace advice given to you by your health care provider. Make sure you discuss any questions you have with your health care provider.   Document Released: 11/22/2005 Document Revised: 08/13/2015 Document Reviewed:  02/17/2015 Elsevier Interactive Patient Education 2016 Elsevier Inc.    Musculoskeletal Pain Musculoskeletal pain is muscle and boney aches and pains. These pains can occur in any part of the body. Your caregiver may treat you without knowing the cause of the pain. They may treat you if blood or urine tests, X-rays, and other tests were normal.  CAUSES There is often not a definite cause or reason for these pains. These pains may be caused by a type of germ (virus). The discomfort may also come from overuse. Overuse includes working out too hard when your body is not fit. Boney aches also come from weather changes. Bone is sensitive to atmospheric pressure changes. HOME CARE INSTRUCTIONS   Ask when your test results will be ready. Make sure you get your test results.  Only take over-the-counter or prescription medicines for pain, discomfort, or fever as directed by your caregiver. If you were given medications for your condition, do not drive, operate machinery or power tools, or sign legal documents for 24 hours. Do not drink alcohol. Do not take sleeping pills or other medications that may interfere with treatment.  Continue all activities unless the activities cause more pain. When the pain lessens, slowly resume normal activities. Gradually increase the intensity and duration of the activities or exercise.  During periods of severe pain, bed rest may be helpful. Lay or sit in any position that is comfortable.  Putting ice on the injured area.  Put ice in a bag.  Place a towel between your skin and the bag.  Leave the ice on for 15 to 20 minutes, 3 to 4 times a day.  Follow up with your caregiver for continued problems and no reason can be found for the pain. If the pain becomes worse or does not go away, it may be necessary to repeat tests or do additional testing. Your caregiver may need to look further for a possible cause. SEEK IMMEDIATE MEDICAL CARE IF:  You have pain that is  getting worse and is not relieved by medications.  You develop chest pain that is associated with shortness or breath, sweating, feeling sick to your stomach (nauseous), or throw up (vomit).  Your pain becomes localized to the abdomen.  You develop any new symptoms that seem different or that concern you. MAKE SURE YOU:   Understand these instructions.  Will watch your condition.  Will get help right away if you are not doing well or get worse.   This information is not intended to replace advice given to you by your health care provider. Make sure you discuss any questions you have with your health care provider.   Document Released: 11/22/2005 Document Revised: 02/14/2012 Document Reviewed: 07/27/2013 Elsevier Interactive Patient Education 2016 Elsevier Inc.   Possible Statin Myopathy Myopathy is a general term. It refers to any diseases of the muscles. The muscular dystrophies that run in families are one example. Another example are those diseases that produce redness, soreness, and swelling (inflammation) in the muscles. CAUSES  Myopathies can be acquired or passed down from parent to child (hereditary). It is unknown what causes the myopathies with inflammation. Myopathies can occur at birth or later in life. They may be caused by:  Endocrine disorders, such as thyroid disease.  Metabolic disorders, which are usually inherited.  Infection or inflammation of the muscle. This is often triggered by viruses or an immune system that attacks the muscles.  Certain drugs, such as lipid-lowering medicines. SYMPTOMS  General symptoms include weakness or pain of the limbs. These feelings are usually present close to the center of the body (proximal). Some people report that their myopathy happens during exercise. In some cases, the symptoms decrease as exercise increases. Depending upon the type, one muscle group may be more affected than another. In some cases, people have a myopathy  with no symptoms. In the inherited myopathies, some family members may not be affected by symptoms. Other family members may have a range of symptoms. DIAGNOSIS  Diagnosis is based on your exam and symptoms.  Often, blood tests will be done. This is to measure muscle enzyme levels.  A test may be done to measure electrical activity of the muscle (electromyography, EMG).  A tissue sample (biopsy) of the affected muscle may be taken.  A computerized magnetic scan (MRI) may also be performed. TREATMENT  Treatments vary depending on the type of myopathy. In some cases, treatment to relieve symptoms may be all that is available or needed. Treatment for other forms of myopathy may include medicines. Immunosuppressive drugs and other disease-modifying antirheumatic drugs (DMARDs) may be used. Physical therapy, bracing, or surgery may be needed. HOME CARE INSTRUCTIONS   Certain diets and exercises may be encouraged depending on the type of myopathy.  Sun exposure may be discouraged as it can cause rashes.  Physical therapy with a muscle strengthening program may be advised.  It is important to practice good general health to maintain a normal body weight. SEEK IMMEDIATE MEDICAL CARE IF:  You develop breathing problems.  You develop a rash.  You have a fever. [place] [facility] Neu[facility] Disorders [facility] Stroke: [email or web address]   This information is not intended to replace advice given to you by your health care provider. Make sure you discuss any questions you have with your health care provider.   Document Released: 11/12/2002 Document Revised: 02/14/2012 Document Reviewed: 06/04/2015 Elsevier Interactive Patient Education [employer].

## 2016-06-26 NOTE — ED Notes (Addendum)
Pt received 324 ASA with EMS.

## 2016-06-26 NOTE — ED Notes (Signed)
Pt noted to be laughing and playing around on way back from xray. Pt noted to be in no apparent distress.

## 2016-06-26 NOTE — ED Notes (Signed)
Per [facility]: Pt is hyperglycemic, pt complaining of central chest pain, non radiating that started at 1 am today. Pt is also complaining of bilateral below the knee leg pain, states that she has restless leg syndrome "but this feels different", per EMS the pt was ambulatory on scene.

## 2016-07-02 ENCOUNTER — Emergency Department (HOSPITAL_COMMUNITY)
Admission: EM | Admit: 2016-07-02 | Discharge: 2016-07-02 | Disposition: A | Discharge status: Home / Self Care | Payer: Medicaid Other | Attending: Emergency Medicine | Admitting: Emergency Medicine

## 2016-07-02 ENCOUNTER — Encounter (HOSPITAL_COMMUNITY): Payer: Self-pay | Admitting: Emergency Medicine

## 2016-07-02 DIAGNOSIS — Z79899 Other long term (current) drug therapy: Secondary | ICD-10-CM | POA: Diagnosis not present

## 2016-07-02 DIAGNOSIS — Z87891 Personal history of nicotine dependence: Secondary | ICD-10-CM | POA: Insufficient documentation

## 2016-07-02 DIAGNOSIS — Z794 Long term (current) use of insulin: Secondary | ICD-10-CM | POA: Insufficient documentation

## 2016-07-02 DIAGNOSIS — M79662 Pain in left lower leg: Secondary | ICD-10-CM | POA: Diagnosis present

## 2016-07-02 DIAGNOSIS — E1140 Type 2 diabetes mellitus with diabetic neuropathy, unspecified: Secondary | ICD-10-CM

## 2016-07-02 DIAGNOSIS — I10 Essential (primary) hypertension: Secondary | ICD-10-CM | POA: Diagnosis not present

## 2016-07-02 DIAGNOSIS — J449 Chronic obstructive pulmonary disease, unspecified: Secondary | ICD-10-CM | POA: Insufficient documentation

## 2016-07-02 MED ORDER — OXYCODONE-ACETAMINOPHEN 5-325 MG PO TABS: 1.0000 | ORAL_TABLET | Freq: Four times a day (QID) | ORAL | 0 refills | Status: DC | PRN

## 2016-07-02 MED ORDER — OXYCODONE-ACETAMINOPHEN 5-325 MG PO TABS
2.0000 | ORAL_TABLET | Freq: Once | ORAL | Status: AC
  Administered 2016-07-02: 2 via ORAL
  Filled 2016-07-02: qty 2

## 2016-07-02 NOTE — Progress Notes (Signed)
CSW provided Patient with taxi voucher as she her home is not located near a bus stop and she has her young daughter with her. CSW signing off as social work intervention has been completed. Please contact if new need(s) arise.          Emiliano Dyer, LCSW [facility] ED/8M Clinical Social Worker [phone]

## 2016-07-02 NOTE — ED Provider Notes (Signed)
[place] DEPT Provider Note   CSN: [id number] Arrival date & time: 07/02/16  [id number]  First Provider Contact:  None       History   Chief Complaint Chief Complaint  Patient presents with  . Leg Pain    HPI [name] is a 40 y.o. female.  HPI   Patient to the ER with complaints of  Bilateral below the knee pain. She was seen a few days ago for CP and he same. SHe says that she has had this for years, she saw a vascular provider who told her to wear TED stalking's and they said that everything else was normal as far as her vascular system goes. She is on multiple medications for neuropathy. She also says that she has arthritis. She says her pain is worse when she lays down or elevates her feet. Her pain is resolved when she is walking. She has not had any N/V/D, CP/SOB, back pain, abdominal pain or any other associated symptoms aside from pain to her legs. It has been going on for a few years but has been worse the past 1-2 weeks.   Past Medical History:  Diagnosis Date  . Alveolar hypoventilation   . Anemia   . Arthritis   . Asthma   . Bipolar 2 disorder ([place])   . Carpal tunnel syndrome on right    recurrent  . Cellulitis 08/2010-08/2011  . Chest pain    NO CAD by cath [id number], nl LV systolic fxn  . Chronic pain   . COPD (chronic obstructive pulmonary disease) ([place])   . Costochondritis   . Depression   . Diabetes mellitus 2000   Type 2, Uncontrolled  . Drug-seeking behavior   . GERD (gastroesophageal reflux disease)   . HLD (hyperlipidemia)   . Hypertension   . Morbid obesity ([place])   . Obstructive sleep apnea   . Pneumonia    as a baby  . Rectal fissure   . Syncope    3/ 2017  . Varicose veins    Right medial thigh and Left leg     Patient Active Problem List   Diagnosis Date Noted  . Nausea 03/17/2016  . Syncope 02/25/2016  . Ruptured tympanic membrane 11/28/2015  . De Quervain's tenosynovitis, bilateral 11/01/2015  . Vitamin D deficiency 09/05/2015    . Recurrent candidiasis of vagina 09/05/2015  . Varicose veins of leg with complications [id number]  . Neuropathy, intercostal nerve 12/17/2014  . Rectal itching 11/17/2014  . Restless leg syndrome 10/17/2014  . Skin infection 08/26/2014  . Chronic sinusitis 07/18/2014  . Headache 07/15/2014  . Vision, loss, sudden 07/12/2014  . Middle ear effusion 05/21/2014  . Cough 05/13/2014  . Urge incontinence 10/15/2013  . Insomnia 08/14/2013  . Encounter for chronic pain management 06/30/2013  . Low back pain 01/31/2013  . HLD (hyperlipidemia) 11/19/2012  . Chest pain 06/27/2012  . Epigastric pain 06/22/2012  . Right carpal tunnel syndrome 09/01/2011  . Bilateral knee pain 09/01/2011  . DM (diabetes mellitus), type 2, uncontrolled ([place]) 05/22/2008  . Morbid obesity ([place]) 05/22/2008  . 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 DEPRESSION 05/22/2008  . Obstructive sleep apnea 05/22/2008  . Essential hypertension 05/22/2008  . ASTHMA 05/22/2008  . GERD 05/22/2008    Past Surgical History:  Procedure Laterality Date  . CARPAL TUNNEL RELEASE    . COLONOSCOPY WITH PROPOFOL  11/02/2011  . KNEE ARTHROSCOPY Right 07/17/2010  . LEFT HEART CATHETERIZATION WITH CORONARY ANGIOGRAM N/A 07/27/2012   Procedure:  LEFT HEART CATHETERIZATION WITH CORONARY ANGIOGRAM;  Surgeon: Sherren Mocha, MD;  Location: [facility] CATH LAB;  Service: Cardiovascular;  Laterality: N/A;  . MASS EXCISION N/A 06/29/2013   Procedure:  WIDE LOCAL EXCISION OF POSTERIOR NECK ABSCESS;  Surgeon: Ralene Ok, MD;  Location: [place];  Service: General;  Laterality: N/A;    OB History    Gravida Para Term Preterm AB Living   2 1     1 1    SAB TAB Ectopic Multiple Live Births   1             Home Medications    Prior to Admission medications   Medication Sig Start Date End Date Taking? Authorizing Provider  atorvastatin (LIPITOR) 20 MG tablet Take 1 tablet (20 mg total) by mouth daily. 03/22/16  Yes [name], MD   budesonide-formoterol [facility]) 160-4.5 MCG/ACT inhaler INHALE 2 PUFFS INTO LUNGS TWICE A DAY 06/10/16  Yes [name], MD  carvedilol (COREG) 12.5 MG tablet TAKE 1 TABLET BY MOUTH TWICE DAILY WITH A MEAL 08/12/15  Yes [name], MD  cetirizine (ZYRTEC) 10 MG tablet TAKE 1 TABLET BY MOUTH DAILY Patient taking differently: TAKE 1 TABLET BY MOUTH DAILY AS NEEDED FOR ALLERGIES 07/16/15  Yes [name], MD  co-enzyme Q-10 50 MG capsule Take 2 capsules (100 mg total) by mouth daily. 06/26/16  Yes Kristen N Ward, DO  cyclobenzaprine (FLEXERIL) 10 MG tablet TAKE 1 TABLET BY MOUTH TWICE DAILY AS NEEDED FOR MUSCLE SPASMS. 04/29/16  Yes [name], MD  fluticasone (FLONASE) 50 MCG/ACT nasal spray Place 2 sprays into both nostrils daily. 05/21/14  Yes Melony Overly, MD  gabapentin (NEURONTIN) 600 MG tablet Take 600-900 mg by mouth 3 (three) times daily. Take 1.5 tablets by mouth in the morning and afternoon and take 2 tablets by mouth in the evening   Yes Historical Provider, MD  HYDROcodone-acetaminophen (NORCO) 10-325 MG tablet Take 1 tablet by mouth every 8 (eight) hours as needed for severe pain. 04/20/16  Yes [name], MD  Insulin Glargine (LANTUS SOLOSTAR) 100 UNIT/ML Solostar Pen Inject 700 Units into the skin every morning. And pen needles 4/day 12/09/15  Yes [name], MD  lisinopril (PRINIVIL,ZESTRIL) 10 MG tablet Take 1 tablet (10 mg total) by mouth daily. 03/12/14  Yes [name], MD  naproxen (NAPROSYN) 500 MG tablet Take 1 tablet (500 mg total) by mouth 2 (two) times daily. 06/26/16  Yes Kristen N Ward, DO  nitroGLYCERIN (NITROSTAT) 0.4 MG SL tablet DISSOLVE 1 TABLET UNDER THE TONGUE EVERY 5 MINUTES AS NEEDED FOR CHEST PAIN. 10/21/15  Yes [name], MD  ondansetron (ZOFRAN ODT) 4 MG disintegrating tablet Take 1 tablet (4 mg total) by mouth every 8 (eight) hours as needed for nausea. 03/15/16  Yes [name], MD  pantoprazole  (PROTONIX) 40 MG tablet TAKE 1 TABLET BY MOUTH EVERY DAY 09/15/15  Yes [name], MD  pramipexole (MIRAPEX) 0.125 MG tablet TAKE 3 TABLETS BY MOUTH EVERY NIGHT AT BEDTIME 11/13/15  Yes [name], MD  PROVENTIL HFA 108 (90 BASE) MCG/ACT inhaler INHALE 1 PUFF BY MOUTH EVERY 6 HOURS AS NEEDED FOR WHEEZING OR SHORTNESS OF BREATH. 07/16/15  Yes [name], MD  ranitidine (ZANTAC) 150 MG tablet Take 1 tablet (150 mg total) by mouth 2 (two) times daily. 03/15/16 03/15/17 Yes [name], MD  topiramate (TOPAMAX) 25 MG tablet TAKE 1 TABLET BY MOUTH EVERY NIGHT  AT BEDTIME 09/15/15  Yes [name], MD  ACCU-CHEK SMARTVIEW test strip USE TO TEST BLOOD SUGAR FOUR TIMES DAILY 06/10/16   [name], MD    Family History Family History  Problem Relation Age of Onset  . Diabetes Mother   . Hyperlipidemia Mother   . Depression Mother   . Varicose Veins Mother   . Heart attack Paternal Uncle   . Heart disease Paternal Grandmother   . Heart attack Paternal Grandmother   . Heart attack Paternal Grandfather   . Heart disease Paternal Grandfather   . Heart attack Father     Social History Social History  Substance Use Topics  . Smoking status: Former Smoker    Packs/day: 0.30    Years: 0.30    Types: Cigarettes    Quit date: 12/06/1993  . Smokeless tobacco: Never Used  . Alcohol use No     Allergies   Kiwi extract and Nubain [nalbuphine hcl]   Review of Systems Review of Systems Review of Systems All other systems negative except as documented in the HPI. All pertinent positives and negatives as reviewed in the HPI.   Physical Exam Updated Vital Signs LMP 05/11/2016 (Approximate)   SpO2 98%   Physical Exam  Constitutional: She appears well-developed and well-nourished. No distress.  HENT:  Head: Normocephalic and atraumatic.  Right Ear: Tympanic membrane and ear canal normal.  Left Ear: Tympanic membrane and ear canal normal.  Nose:  Nose normal.  Mouth/Throat: Uvula is midline, oropharynx is clear and moist and mucous membranes are normal.  Eyes: Pupils are equal, round, and reactive to light.  Neck: Normal range of motion. Neck supple.  Cardiovascular: Normal rate and regular rhythm.   Pulmonary/Chest: Effort normal.  Abdominal: Soft.  No signs of abdominal distention  Musculoskeletal:  No LE swelling, tenderness to palpation of bilateral feet. Palpable pedal pulses. CR < 3 seconds to all 10 toes. Skin is warm and moist. No mottling. Pain to touch. No rash or wounds appreciated.  Neurological: She is alert.  Acting at baseline  Skin: Skin is warm and dry. No rash noted.  Nursing note and vitals reviewed.  ED Treatments / Results  Labs (all labs ordered are listed, but only abnormal results are displayed) Labs Reviewed - No data to display  EKG  EKG Interpretation None       Radiology No results found.  Procedures Procedures (including critical care time)  Medications Ordered in ED Medications  oxyCODONE-acetaminophen (PERCOCET/ROXICET) 5-325 MG per tablet 2 tablet (not administered)     Initial Impression / Assessment and Plan / ED Course  I have reviewed the triage vital signs and the nursing notes.  Pertinent labs & imaging results that were available during my care of the patient were reviewed by me and considered in my medical decision making (see chart for details).  Clinical Course    Patient given pain medication in the ED.  Her symptoms appear to be consistent with neuropathy and arthritis.  She is not having any symptoms consistent with PE or vascular abnormality. She feels comfortable to get pain treatment in the ED and a small rx for pain control and will call her PCP in the morning and will need to see PCP same day or early next week.  Medications  oxyCODONE-acetaminophen (PERCOCET/ROXICET) 5-325 MG per tablet 2 tablet (not administered)    I discussed results, diagnoses and  plan with [name]. They voice there understanding and questions were answered. We  discussed follow-up recommendations and return precautions.    Final Clinical Impressions(s) / ED Diagnoses   Final diagnoses:  Diabetic neuropathy, painful [facility])    New Prescriptions New Prescriptions   No medications on file      Earney Navy [id number]    Delora Fuel, MD [id number]

## 2016-07-02 NOTE — ED Triage Notes (Signed)
Pt last seen for CP 7 days ago.   HTN upon ems arrival. Did not take even meds.  700 u of insulin at 11 am?  Stabbing leg pain 8-9/10. Radiating more in her feet. Comes from home.

## 2016-07-05 ENCOUNTER — Ambulatory Visit: Payer: Medicaid Other | Admitting: Family Medicine

## 2016-07-08 ENCOUNTER — Other Ambulatory Visit (HOSPITAL_COMMUNITY): Payer: Self-pay | Admitting: *Deleted

## 2016-07-08 ENCOUNTER — Inpatient Hospital Stay (HOSPITAL_COMMUNITY)
Admission: RE | Admit: 2016-07-08 | Discharge: 2016-07-08 | Disposition: A | Discharge status: Home / Self Care | Payer: Medicaid Other | Source: Ambulatory Visit

## 2016-07-08 ENCOUNTER — Other Ambulatory Visit: Payer: Self-pay | Admitting: Orthopedic Surgery

## 2016-07-08 ENCOUNTER — Encounter (HOSPITAL_COMMUNITY): Payer: Self-pay | Admitting: Vascular Surgery

## 2016-07-12 ENCOUNTER — Ambulatory Visit (INDEPENDENT_AMBULATORY_CARE_PROVIDER_SITE_OTHER): Payer: Medicaid Other | Admitting: Family Medicine

## 2016-07-12 VITALS — BP 140/82 | HR 100 | Temp 98.5°F | Ht 62.0 in | Wt 307.0 lb

## 2016-07-12 DIAGNOSIS — Z7189 Other specified counseling: Secondary | ICD-10-CM

## 2016-07-12 DIAGNOSIS — I10 Essential (primary) hypertension: Secondary | ICD-10-CM | POA: Diagnosis not present

## 2016-07-12 DIAGNOSIS — L84 Corns and callosities: Secondary | ICD-10-CM

## 2016-07-12 DIAGNOSIS — L089 Local infection of the skin and subcutaneous tissue, unspecified: Secondary | ICD-10-CM | POA: Diagnosis not present

## 2016-07-12 DIAGNOSIS — E1142 Type 2 diabetes mellitus with diabetic polyneuropathy: Secondary | ICD-10-CM | POA: Diagnosis not present

## 2016-07-12 DIAGNOSIS — G2581 Restless legs syndrome: Secondary | ICD-10-CM | POA: Diagnosis not present

## 2016-07-12 DIAGNOSIS — G8929 Other chronic pain: Secondary | ICD-10-CM

## 2016-07-12 DIAGNOSIS — R51 Headache: Secondary | ICD-10-CM | POA: Diagnosis not present

## 2016-07-12 MED ORDER — GABAPENTIN 600 MG PO TABS: 1200.0000 mg | ORAL_TABLET | Freq: Three times a day (TID) | ORAL | 0 refills | Status: DC

## 2016-07-12 MED ORDER — HYDROCODONE-ACETAMINOPHEN 10-325 MG PO TABS: 1.0000 | ORAL_TABLET | Freq: Three times a day (TID) | ORAL | 0 refills | Status: DC | PRN

## 2016-07-12 NOTE — Patient Instructions (Addendum)
Go up to 2 pills three times daily on your gabapentin  Refilled pain medications. Make sure these are stored in a safe, secure place.  Referring to foot doctor. Someone will call with an appointment. Call us if it gets worse before you can get in there.  See me in 3 months.  Be well, Dr. [name]

## 2016-07-12 NOTE — Progress Notes (Signed)
Date of Visit: 07/12/2016   HPI:  Patient presents for routine follow up.  Chronic pain - stable on norco. Reports storing this medication safely. Due for refill. No unwanted side effects. Medication helps her be functional. Does note that she has taken additional pain medications (extra of her own medication) to control severe pain in her legs. Will run out a few days early due to this.  Leg pain - has severe pain in her feet whenever she lays down or is resting. Burning type pain. Takes gabapentin 900mg  in AM & afternoon, and 1200mg  at night.   Foot lesion - has callus type lesion on her foot. Noticed it for a few days. Painful to put pressure on.   ROS: See HPI.  [place]: chronic pain, type 2 diabetes, hypertension, asthma, hyperlipidemia, GERD, vit D deficiency, carpal tunnel syndrome  PHYSICAL EXAM: BP 140/82 (BP Location: Left Arm, Patient Position: Sitting, Cuff Size: Normal)   Pulse 100   Temp 98.5 F (36.9 C) (Oral)   Ht 5\' 2"  (1.575 m)   Wt (!) 307 lb (139.3 kg)   LMP 05/11/2016 (Approximate)   BMI 56.15 kg/m  Gen: NAD, pleasant, cooperative Ext: Normal monofilament bilaterally  Pulses difficult to palpate in R foot - but heard with doppler  Callous-like lesion of R medial heel, tender to palpation, no surrounding erythema  Otherwise feet within normal limits   ASSESSMENT/PLAN:  Diabetic neuropathy ([name]) Uncontrolled. Increase gabapentin to 1200mg  three times daily. Follow up if not improving. For heel callus - will refer to podiatry for likely debridement of callus. Follow up here sooner if worsening before she can get in there.   Encounter for chronic pain management Stable. [place] Controlled Substance Database reviewed, findings are appropriate.  Adamantly discussed that she CANNOT take more than prescribed. Will refill early this one time, with the expectation that this cannot be repeated. She understands.  rx given x3 mos  FOLLOW UP: Follow up in 3 months with me  for routine medical problems Referring to podiatry  [name], [place]

## 2016-07-13 ENCOUNTER — Encounter (HOSPITAL_COMMUNITY)
Admission: RE | Admit: 2016-07-13 | Discharge: 2016-07-13 | Disposition: A | Discharge status: Home / Self Care | Payer: Medicaid Other | Source: Ambulatory Visit | Attending: Orthopedic Surgery | Admitting: Orthopedic Surgery

## 2016-07-13 ENCOUNTER — Encounter (HOSPITAL_COMMUNITY): Payer: Self-pay

## 2016-07-13 DIAGNOSIS — Z794 Long term (current) use of insulin: Secondary | ICD-10-CM | POA: Diagnosis not present

## 2016-07-13 DIAGNOSIS — E785 Hyperlipidemia, unspecified: Secondary | ICD-10-CM | POA: Insufficient documentation

## 2016-07-13 DIAGNOSIS — G4733 Obstructive sleep apnea (adult) (pediatric): Secondary | ICD-10-CM | POA: Diagnosis not present

## 2016-07-13 DIAGNOSIS — Z01818 Encounter for other preprocedural examination: Secondary | ICD-10-CM | POA: Diagnosis not present

## 2016-07-13 DIAGNOSIS — Z7951 Long term (current) use of inhaled steroids: Secondary | ICD-10-CM | POA: Insufficient documentation

## 2016-07-13 DIAGNOSIS — I10 Essential (primary) hypertension: Secondary | ICD-10-CM | POA: Insufficient documentation

## 2016-07-13 DIAGNOSIS — Z87891 Personal history of nicotine dependence: Secondary | ICD-10-CM | POA: Diagnosis not present

## 2016-07-13 DIAGNOSIS — K219 Gastro-esophageal reflux disease without esophagitis: Secondary | ICD-10-CM | POA: Diagnosis not present

## 2016-07-13 DIAGNOSIS — J449 Chronic obstructive pulmonary disease, unspecified: Secondary | ICD-10-CM | POA: Diagnosis not present

## 2016-07-13 DIAGNOSIS — Z01812 Encounter for preprocedural laboratory examination: Secondary | ICD-10-CM | POA: Diagnosis not present

## 2016-07-13 DIAGNOSIS — Z6841 Body Mass Index (BMI) 40.0 and over, adult: Secondary | ICD-10-CM | POA: Diagnosis not present

## 2016-07-13 DIAGNOSIS — E1165 Type 2 diabetes mellitus with hyperglycemia: Secondary | ICD-10-CM | POA: Diagnosis not present

## 2016-07-13 DIAGNOSIS — G5601 Carpal tunnel syndrome, right upper limb: Secondary | ICD-10-CM | POA: Diagnosis not present

## 2016-07-13 DIAGNOSIS — Z79899 Other long term (current) drug therapy: Secondary | ICD-10-CM | POA: Insufficient documentation

## 2016-07-13 HISTORY — DX: Nocturia: R35.1

## 2016-07-13 HISTORY — DX: Frequency of micturition: R35.0

## 2016-07-13 HISTORY — DX: Restless legs syndrome: G25.81

## 2016-07-13 HISTORY — DX: Other muscle spasm: M62.838

## 2016-07-13 HISTORY — DX: Dizziness and giddiness: R42

## 2016-07-13 HISTORY — DX: Headache: R51

## 2016-07-13 HISTORY — DX: Polyneuropathy, unspecified: G62.9

## 2016-07-13 LAB — CBC
HCT: 40.4 % (ref 36.0–46.0)
Hemoglobin: 13.5 g/dL (ref 12.0–15.0)
MCH: 29.6 pg (ref 26.0–34.0)
MCHC: 33.4 g/dL (ref 30.0–36.0)
MCV: 88.6 fL (ref 78.0–100.0)
PLATELETS: 308 10*3/uL (ref 150–400)
RBC: 4.56 MIL/uL (ref 3.87–5.11)
RDW: 12.3 % (ref 11.5–15.5)
WBC: 9.3 10*3/uL (ref 4.0–10.5)

## 2016-07-13 LAB — BASIC METABOLIC PANEL
Anion gap: 9 (ref 5–15)
BUN: 12 mg/dL (ref 6–20)
CALCIUM: 9.5 mg/dL (ref 8.9–10.3)
CO2: 28 mmol/L (ref 22–32)
CREATININE: 0.48 mg/dL (ref 0.44–1.00)
Chloride: 98 mmol/L — ABNORMAL LOW (ref 101–111)
GFR calc non Af Amer: >60 mL/min (ref >60)
Glucose, Bld: 338 mg/dL — ABNORMAL HIGH (ref 65–99)
Potassium: 4.2 mmol/L (ref 3.5–5.1)
Sodium: 135 mmol/L (ref 135–145)

## 2016-07-13 LAB — GLUCOSE, CAPILLARY: Glucose-Capillary: 357 mg/dL — ABNORMAL HIGH (ref 65–99)

## 2016-07-13 LAB — HCG, SERUM, QUALITATIVE: PREG SERUM: NEGATIVE

## 2016-07-13 MED ORDER — CHLORHEXIDINE GLUCONATE 4 % EX LIQD: 60.0000 mL | Freq: Once | CUTANEOUS | Status: DC

## 2016-07-13 NOTE — Progress Notes (Signed)
Cardiologist is Dr.Cooper with last visit in epic from 06-10-16  Medical Md is Dr.Brittany [name]  Echo report in epic from 2004/2017  Stress test report in epic from 2013  Heart cath  EKG and CXR in epic from 06-26-16

## 2016-07-13 NOTE — Pre-Procedure Instructions (Signed)
[name]  07/13/2016      [facility] - [place], [place] - [address] [address] Phone: [phone] Fax: [phone]    Your procedure is scheduled on Thurs, Aug 10 @ 8:25 AM  Report to [facility] Admitting at 6:30 AM  Call this number if you have problems the morning of surgery:  [phone]   Remember:  Do not eat food or drink liquids after midnight.  Take these medicines the morning of surgery with A SIP OF WATER Symbicort<Bring Your Inhaler With You>,Carvedilol(Coreg),Zyrtec(Cetirizine), Flonase(Fluticasone),Gabapentin(Neurontin), Ondansetron(Zofran-if needed),Pantoprazole(Protonix),Proventil,and  Zantac(Ranitidine)              Stop taking your Aleve. No Goody's,BC's,Advil,Motrin,Ibuprofen,Fish Oil,or any Herbal Medications.                  How to Manage Your Diabetes Before and After Surgery  Why is it important to control my blood sugar before and after surgery? . Improving blood sugar levels before and after surgery helps healing and can limit problems. . A way of improving blood sugar control is eating a healthy diet by: o  Eating less sugar and carbohydrates o  Increasing activity/exercise o  Talking with your doctor about reaching your blood sugar goals . High blood sugars (greater than 180 mg/dL) can raise your risk of infections and slow your recovery, so you will need to focus on controlling your diabetes during the weeks before surgery. . Make sure that the doctor who takes care of your diabetes knows about your planned surgery including the date and location.  How do I manage my blood sugar before surgery? . Check your blood sugar at least 4 times a day, starting 2 days before surgery, to make sure that the level is not too high or low. o Check your blood sugar the morning of your surgery when you wake up and every 2 hours until you get to the Short Stay unit. . If your blood  sugar is less than 70 mg/dL, you will need to treat for low blood sugar: o Do not take insulin. o Treat a low blood sugar (less than 70 mg/dL) with  cup of clear juice (cranberry or apple), 4 glucose tablets, OR glucose gel. o Recheck blood sugar in 15 minutes after treatment (to make sure it is greater than 70 mg/dL). If your blood sugar is not greater than 70 mg/dL on recheck, call [phone] for further instructions. . Report your blood sugar to the short stay nurse when you get to Short Stay.  . If you are admitted to the hospital after surgery: o Your blood sugar will be checked by the staff and you will probably be given insulin after surgery (instead of oral diabetes medicines) to make sure you have good blood sugar levels. o The goal for blood sugar control after surgery is 80-180 mg/dL.              WHAT DO I DO ABOUT MY DIABETES MEDICATION?   Marland Kitchen Do not take oral diabetes medicines (pills) the morning of surgery.  . .       . HE MORNING OF SURGERY, take _______70______ units of _____Lantus_____insulin.  . The day of surgery, do not take other diabetes injectables, including Byetta (exenatide), Bydureon (exenatide ER), Victoza (liraglutide), or Trulicity (dulaglutide).  . If your CBG is greater than 220 mg/dL, you may take  of your sliding scale (correction) dose of  insulin.  Other Instructions:          Patient Signature:  Date:   Nurse Signature:  Date:   Reviewed and Endorsed by [facility] Patient Education Committee, August 2015   Do not wear jewelry, make-up or nail polish.  Do not wear lotions, powders, or perfumes.    Do not shave 48 hours prior to surgery.     Do not bring valuables to the hospital.  [facility] is not responsible for any belongings or valuables.  Contacts, dentures or bridgework may not be worn into surgery.  Leave your suitcase in the car.  After surgery it may be brought to your room.  For patients admitted to the  hospital, discharge time will be determined by your treatment team.  Patients discharged the day of surgery will not be allowed to drive home.    [facility] - [facility]  Before surgery, you can play an important role.  Because skin is not sterile, your skin needs to be as free of germs as possible.  You can reduce the number of germs on you skin by washing with CHG (chlorahexidine gluconate) soap before surgery.  CHG is an antiseptic cleaner which kills germs and bonds with the skin to continue killing germs even after washing.  Please DO NOT use if you have an allergy to CHG or antibacterial soaps.  If your skin becomes reddened/irritated stop using the CHG and inform your nurse when you arrive at Short Stay.  Do not shave (including legs and underarms) for at least 48 hours prior to the first CHG shower.  You may shave your face.  Please follow these instructions carefully:   1.  Shower with CHG Soap the night before surgery and the                                morning of Surgery.  2.  If you choose to wash your hair, wash your hair first as usual with your       normal shampoo.  3.  After you shampoo, rinse your hair and body thoroughly to remove the                      Shampoo.  4.  Use CHG as you would any other liquid soap.  You can apply chg directly       to the skin and wash gently with scrungie or a clean washcloth.  5.  Apply the CHG Soap to your body ONLY FROM THE NECK DOWN.        Do not use on open wounds or open sores.  Avoid contact with your eyes,       ears, mouth and genitals (private parts).  Wash genitals (private parts)       with your normal soap.  6.  Wash thoroughly, paying special attention to the area where your surgery        will be performed.  7.  Thoroughly rinse your body with warm water from the neck down.  8.  DO NOT shower/wash with your normal soap after using and rinsing off       the CHG Soap.  9.  Pat yourself dry with a  clean towel.            10.  Wear clean pajamas.  11.  Place clean sheets on your bed the night of your first shower and do not        sleep with pets.  Day of Surgery  Do not apply any lotions/deoderants the morning of surgery.  Please wear clean clothes to the hospital/surgery center.    Please read over the following fact sheets that you were given. Pain Booklet, Coughing and Deep Breathing and Surgical Site Infection Prevention

## 2016-07-14 ENCOUNTER — Emergency Department (HOSPITAL_COMMUNITY): Payer: Medicaid Other

## 2016-07-14 ENCOUNTER — Encounter (HOSPITAL_COMMUNITY): Payer: Self-pay | Admitting: *Deleted

## 2016-07-14 ENCOUNTER — Emergency Department (HOSPITAL_COMMUNITY)
Admission: EM | Admit: 2016-07-14 | Discharge: 2016-07-14 | Disposition: A | Discharge status: Home / Self Care | Payer: Medicaid Other | Attending: Emergency Medicine | Admitting: Emergency Medicine

## 2016-07-14 DIAGNOSIS — E119 Type 2 diabetes mellitus without complications: Secondary | ICD-10-CM | POA: Insufficient documentation

## 2016-07-14 DIAGNOSIS — J45909 Unspecified asthma, uncomplicated: Secondary | ICD-10-CM | POA: Insufficient documentation

## 2016-07-14 DIAGNOSIS — L989 Disorder of the skin and subcutaneous tissue, unspecified: Secondary | ICD-10-CM | POA: Diagnosis present

## 2016-07-14 DIAGNOSIS — Z87891 Personal history of nicotine dependence: Secondary | ICD-10-CM | POA: Diagnosis not present

## 2016-07-14 DIAGNOSIS — Z794 Long term (current) use of insulin: Secondary | ICD-10-CM | POA: Insufficient documentation

## 2016-07-14 DIAGNOSIS — J449 Chronic obstructive pulmonary disease, unspecified: Secondary | ICD-10-CM | POA: Insufficient documentation

## 2016-07-14 DIAGNOSIS — L84 Corns and callosities: Secondary | ICD-10-CM | POA: Diagnosis not present

## 2016-07-14 DIAGNOSIS — Z79899 Other long term (current) drug therapy: Secondary | ICD-10-CM | POA: Insufficient documentation

## 2016-07-14 DIAGNOSIS — E1140 Type 2 diabetes mellitus with diabetic neuropathy, unspecified: Secondary | ICD-10-CM | POA: Insufficient documentation

## 2016-07-14 DIAGNOSIS — I10 Essential (primary) hypertension: Secondary | ICD-10-CM | POA: Insufficient documentation

## 2016-07-14 LAB — HEMOGLOBIN A1C
Hgb A1c MFr Bld: 13.1 % — ABNORMAL HIGH (ref 4.8–5.6)
MEAN PLASMA GLUCOSE: 329 mg/dL

## 2016-07-14 MED ORDER — DEXTROSE 5 % IV SOLN
3.0000 g | INTRAVENOUS | Status: DC
  Filled 2016-07-14: qty 3000

## 2016-07-14 MED ORDER — IBUPROFEN 200 MG PO TABS
400.0000 mg | ORAL_TABLET | Freq: Once | ORAL | Status: AC
  Administered 2016-07-14: 400 mg via ORAL
  Filled 2016-07-14: qty 2

## 2016-07-14 MED ORDER — ACETAMINOPHEN 500 MG PO TABS
1000.0000 mg | ORAL_TABLET | Freq: Once | ORAL | Status: AC
  Administered 2016-07-14: 1000 mg via ORAL
  Filled 2016-07-14: qty 2

## 2016-07-14 MED ORDER — DOXYCYCLINE HYCLATE 100 MG PO CAPS: 100.0000 mg | ORAL_CAPSULE | Freq: Two times a day (BID) | ORAL | 0 refills | Status: DC

## 2016-07-14 NOTE — Assessment & Plan Note (Addendum)
Uncontrolled. Increase gabapentin to 1200mg  three times daily. Follow up if not improving. For heel callus - will refer to podiatry for likely debridement of callus. Follow up here sooner if worsening before she can get in there.

## 2016-07-14 NOTE — Assessment & Plan Note (Signed)
Stable. [place] Controlled Substance Database reviewed, findings are appropriate.  Adamantly discussed that she CANNOT take more than prescribed. Will refill early this one time, with the expectation that this cannot be repeated. She understands.  rx given x3 mos

## 2016-07-14 NOTE — ED Triage Notes (Signed)
Per EMS, pt from home, pt c/o blister in her R medial ankle.  Was seen about a week ago for same.  States she called her PCP and was advised to come to the ED.  Pt is crying in triage room.

## 2016-07-14 NOTE — ED Notes (Signed)
PT DISCHARGED. INSTRUCTIONS AND PRESCRIPTION GIVEN. AAOX4. PT IN NO APPARENT DISTRESS. THE OPPORTUNITY TO ASK QUESTIONS WAS PROVIDED. 

## 2016-07-14 NOTE — ED Triage Notes (Signed)
Pt reports blister came up Monday, went to see her doctor and was instructed to come to the ED if it doesn't get any better.  Blister is located in R medial lower ankle.  Pt is also a diabetic.  Pt reports severe pain

## 2016-07-14 NOTE — ED Provider Notes (Signed)
[facility] DEPT Provider Note   CSN: [id number] Arrival date & time: 07/14/16  [id number]  First Provider Contact:  First MD Initiated Contact with Patient 07/14/16 1934        History   Chief Complaint Chief Complaint  Patient presents with  . Blister   HPI [name] is a 40 y.o. female.  HPI 40 y.o. female with a hx of DM2, HTN, DM, Chronic Pain, presents to the Emergency Department today complaining of right ankle blister since Monday. Pt unsure of how blister came about. States she saw her PCP yesterday who referred her to podiatry for further management. Pt called PCP today due to increased pain with ambulation and throbbing sensation "when blood flows down legs." Minimal pain with legs resting. No fevers. No other symptoms noted.   Past Medical History:  Diagnosis Date  . Alveolar hypoventilation   . Anemia    not on iron pill  . Arthritis   . Asthma   . Bipolar 2 disorder ([place])   . Carpal tunnel syndrome on right    recurrent  . Cellulitis 08/2010-08/2011  . Chronic pain   . COPD (chronic obstructive pulmonary disease) ([name])    Symbicort daily and Proventil as needed  . Costochondritis   . Depression   . Diabetes mellitus 2000   Type 2, Uncontrolled.Takes Lantus daily.Fasting blood sugar runs 150  . Dizziness    occasionally  . Drug-seeking behavior   . GERD (gastroesophageal reflux disease)    takes Pantoprazole and Zantac daily  . Headache    migraine-last one about a yr ago.Topamax daily  . HLD (hyperlipidemia)    takes Atorvastatin daily  . Hypertension    takes Lisinopril and Coreg daily  . Morbid obesity ([place])   . Muscle spasm    takes Flexeril as needed  . Nocturia   . Obstructive sleep apnea   . Peripheral neuropathy ([name])    takes Gabapentin daily  . Pneumonia    "walking" several yrs ago and as a baby  . Rectal fissure   . Restless leg   . Urinary frequency   . Varicose veins    Right medial thigh and Left leg     Patient Active Problem  List   Diagnosis Date Noted  . Diabetic neuropathy ([place]) 07/14/2016  . Nausea 03/17/2016  . Syncope 02/25/2016  . Ruptured tympanic membrane 11/28/2015  . De Quervain's tenosynovitis, bilateral 11/01/2015  . Vitamin D deficiency 09/05/2015  . Recurrent candidiasis of vagina 09/05/2015  . Varicose veins of leg with complications [id number]  . Neuropathy, intercostal nerve 12/17/2014  . Rectal itching 11/17/2014  . Restless leg syndrome 10/17/2014  . Skin infection 08/26/2014  . Chronic sinusitis 07/18/2014  . Headache 07/15/2014  . Vision, loss, sudden 07/12/2014  . Middle ear effusion 05/21/2014  . Cough 05/13/2014  . Urge incontinence 10/15/2013  . Insomnia 08/14/2013  . Encounter for chronic pain management 06/30/2013  . Low back pain 01/31/2013  . HLD (hyperlipidemia) 11/19/2012  . Chest pain 06/27/2012  . Epigastric pain 06/22/2012  . Right carpal tunnel syndrome 09/01/2011  . Bilateral knee pain 09/01/2011  . DM (diabetes mellitus), type 2, uncontrolled ([place]) 05/22/2008  . Morbid obesity ([place]) 05/22/2008  . 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 DEPRESSION 05/22/2008  . Obstructive sleep apnea 05/22/2008  . Essential hypertension 05/22/2008  . ASTHMA 05/22/2008  . GERD 05/22/2008    Past Surgical History:  Procedure Laterality Date  . CARPAL TUNNEL RELEASE    .  CESAREAN SECTION    . KNEE ARTHROSCOPY Right 07/17/2010  . LEFT HEART CATHETERIZATION WITH CORONARY ANGIOGRAM N/A 07/27/2012   Procedure: LEFT HEART CATHETERIZATION WITH CORONARY ANGIOGRAM;  Surgeon: Sherren Mocha, MD;  Location: [facility] CATH LAB;  Service: Cardiovascular;  Laterality: N/A;  . left knee surgery     screws she thinks  . MASS EXCISION N/A 06/29/2013   Procedure:  WIDE LOCAL EXCISION OF POSTERIOR NECK ABSCESS;  Surgeon: Ralene Ok, MD;  Location: [facility];  Service: General;  Laterality: N/A;    OB History    Gravida Para Term Preterm AB Living   2 1     1 1    SAB TAB Ectopic Multiple  Live Births   1               Home Medications    Prior to Admission medications   Medication Sig Start Date End Date Taking? Authorizing Provider  ACCU-CHEK SMARTVIEW test strip USE TO TEST BLOOD SUGAR FOUR TIMES DAILY 06/10/16  Yes [name], MD  atorvastatin (LIPITOR) 20 MG tablet Take 1 tablet (20 mg total) by mouth daily. 03/22/16  Yes [name], MD  budesonide-formoterol [facility]) 160-4.5 MCG/ACT inhaler INHALE 2 PUFFS INTO LUNGS TWICE A DAY 06/10/16  Yes [name], MD  carvedilol (COREG) 12.5 MG tablet TAKE 1 TABLET BY MOUTH TWICE DAILY WITH A MEAL Patient taking differently: TAKE 12.5 MG BY MOUTH TWICE DAILY WITH A MEAL 08/12/15  Yes [name], MD  cetirizine (ZYRTEC) 10 MG tablet TAKE 1 TABLET BY MOUTH DAILY Patient taking differently: TAKE 10 MG BY MOUTH DAILY AS NEEDED FOR ALLERGIES 07/16/15  Yes [name], MD  co-enzyme Q-10 50 MG capsule Take 2 capsules (100 mg total) by mouth daily. 06/26/16  Yes Kristen N Ward, DO  cyclobenzaprine (FLEXERIL) 10 MG tablet TAKE 1 TABLET BY MOUTH TWICE DAILY AS NEEDED FOR MUSCLE SPASMS. Patient taking differently: Take 10 mg by mouth 2 (two) times daily as needed for muscle spasms.  04/29/16  Yes [name], MD  fluticasone (FLONASE) 50 MCG/ACT nasal spray Place 2 sprays into both nostrils daily. 05/21/14  Yes Melony Overly, MD  gabapentin (NEURONTIN) 600 MG tablet Take 2 tablets (1,200 mg total) by mouth 3 (three) times daily. 07/12/16  Yes [name], MD  HYDROcodone-acetaminophen [facility]) 10-325 MG tablet Take 1 tablet by mouth every 8 (eight) hours as needed for severe pain. 07/12/16  Yes [name], MD  Insulin Glargine (LANTUS SOLOSTAR) 100 UNIT/ML Solostar Pen Inject 700 Units into the skin every morning. And pen needles 4/day 12/09/15  Yes [name], MD  lisinopril (PRINIVIL,ZESTRIL) 10 MG tablet Take 1 tablet (10 mg total) by mouth daily. 03/12/14  Yes [name], MD    nitroGLYCERIN (NITROSTAT) 0.4 MG SL tablet DISSOLVE 1 TABLET UNDER THE TONGUE EVERY 5 MINUTES AS NEEDED FOR CHEST PAIN. 10/21/15  Yes [name], MD  ondansetron (ZOFRAN ODT) 4 MG disintegrating tablet Take 1 tablet (4 mg total) by mouth every 8 (eight) hours as needed for nausea. 03/15/16  Yes [name], MD  pantoprazole ([place]) 40 MG tablet TAKE 1 TABLET BY MOUTH EVERY DAY Patient taking differently: TAKE 40 MG BY MOUTH EVERY DAY 09/15/15  Yes [name], MD  pramipexole (MIRAPEX) 0.125 MG tablet TAKE 3 TABLETS BY MOUTH EVERY NIGHT AT BEDTIME Patient taking differently: TAKE 0.375 MG BY MOUTH EVERY NIGHT AT BEDTIME 11/13/15  Yes [place] J  [name], MD  PROVENTIL HFA 108 (90 BASE) MCG/ACT inhaler INHALE 1 PUFF BY MOUTH EVERY 6 HOURS AS NEEDED FOR WHEEZING OR SHORTNESS OF BREATH. 07/16/15  Yes [name], MD  ranitidine (ZANTAC) 150 MG tablet Take 1 tablet (150 mg total) by mouth 2 (two) times daily. 03/15/16 03/15/17 Yes [name], MD  topiramate (TOPAMAX) 25 MG tablet TAKE 1 TABLET BY MOUTH EVERY NIGHT AT BEDTIME Patient taking differently: TAKE 25 MG BY MOUTH EVERY NIGHT AT BEDTIME 09/15/15  Yes [name], MD  naproxen (NAPROSYN) 500 MG tablet Take 1 tablet (500 mg total) by mouth 2 (two) times daily. Patient not taking: Reported on 07/14/2016 06/26/16   Delice Bison Ward, DO    Family History Family History  Problem Relation Age of Onset  . Diabetes Mother   . Hyperlipidemia Mother   . Depression Mother   . Varicose Veins Mother   . Heart attack Paternal Uncle   . Heart disease Paternal Grandmother   . Heart attack Paternal Grandmother   . Heart attack Paternal Grandfather   . Heart disease Paternal Grandfather   . Heart attack Father     Social History Social History  Substance Use Topics  . Smoking status: Former Smoker    Packs/day: 0.30    Years: 0.30    Types: Cigarettes    Quit date: 12/06/1993  . Smokeless tobacco:  Never Used  . Alcohol use No     Allergies   Kiwi extract and Nubain [nalbuphine hcl]   Review of Systems Review of Systems ROS reviewed and all are negative for acute change except as noted in the HPI.  Physical Exam Updated Vital Signs BP 153/98 (BP Location: Left Arm)   Pulse 112   Temp 98.3 F (36.8 C) (Oral)   Resp 20   LMP 06/24/2016   SpO2 96%   Physical Exam  Constitutional: She is oriented to person, place, and time. Vital signs are normal. She appears well-developed and well-nourished.  Pt Morbidly Obese  HENT:  Head: Normocephalic.  Right Ear: Hearing normal.  Left Ear: Hearing normal.  Eyes: Conjunctivae and EOM are normal. Pupils are equal, round, and reactive to light.  Neck: Normal range of motion.  Cardiovascular: Normal rate and regular rhythm.   Pulmonary/Chest: Effort normal.  Musculoskeletal:  Right heel with blister noted. TTP around affected area with minimal erythema. No induration. No signs of infection. Blister is fluids filled.   Neurological: She is alert and oriented to person, place, and time.  Skin: Skin is warm and dry.  Psychiatric: She has a normal mood and affect. Her speech is normal and behavior is normal. Thought content normal.  Nursing note and vitals reviewed.  ED Treatments / Results  Labs (all labs ordered are listed, but only abnormal results are displayed) Labs Reviewed - No data to display  EKG  EKG Interpretation None      Radiology Dg Foot Complete Right  Result Date: 07/14/2016 CLINICAL DATA:  Blister on right foot on medial surface near calcaneus x2 days. Denies injury. Hx of diabetes. EXAM: RIGHT FOOT COMPLETE - 3+ VIEW COMPARISON:  04/13/2014 FINDINGS: There is diffuse soft tissue swelling. Extensive arterial calcifications are noted. Soft tissue calcifications are noted in the lower leg, raising the question of venous insufficiency. There is no acute fracture or traumatic subluxation. Calcaneal spurs are  present. There is post traumatic change at the tibiotalar joint, stable in appearance. Along the medial aspect of the hindfoot, there  is a soft tissue vesicle, correlating with the history. No associated soft tissue gas or foreign body identified. IMPRESSION: 1. Soft tissue vesicle and soft tissue swelling along the medial aspect of the hindfoot. 2. No acute fracture, foreign body, or soft tissue gas. 3. Chronic skin calcifications raising the question of venous insufficiency. 4. Arterial calcifications. Electronically Signed   By: Nolon Nations M.D.   On: 07/14/2016 19:27    Procedures .Marland KitchenIncision and Drainage Date/Time: 07/14/2016 8:16 PM Performed by: Shary Decamp Authorized by: Shary Decamp   Consent:    Consent obtained:  Verbal   Consent given by:  Patient   Risks discussed:  Bleeding, incomplete drainage, pain and infection Location:    Type:  Fluid collection   Size:  2cm   Location:  Lower extremity   Lower extremity location:  Ankle   Ankle location:  R ankle Procedure type:    Complexity:  Simple Procedure details:    Incision types:  Single straight   Incision depth:  Dermal   Scalpel blade:  11   Wound management:  Extensive cleaning   Drainage:  Purulent and serosanguinous   Drainage amount:  Copious   Wound treatment:  Wound left open   Packing materials:  1/2 in gauze Post-procedure details:    Patient tolerance of procedure:  Tolerated well, no immediate complications   (including critical care time)  Medications Ordered in ED Medications - No data to display   Initial Impression / Assessment and Plan / ED Course  I have reviewed the triage vital signs and the nursing notes.  Pertinent labs & imaging results that were available during my care of the patient were reviewed by me and considered in my medical decision making (see chart for details).  Clinical Course    Final Clinical Impressions(s) / ED Diagnoses  I have reviewed and evaluated the relevant  imaging studies.  I have reviewed the relevant previous healthcare records. I obtained HPI from historian.  ED Course:  Assessment: Pt is a 40yF with hx DM2, HTN, DM, Chronic Pain who presents with right heel blister since Monday. Seen by PCP yesterday and referred to Podiatry. Pt presents after calling PCP due to increased pain. Pt able to ambulate, but states that it hurts. On exam, pt in NAD. Nontoxic/nonseptic appearing. VSS. Afebrile. Right heel with fluid filled blister. Minimal erythema. No induration. Imaging shows soft tissue vesicle. No soft tissue gas. Placed incision on blister and drained of fluid. Purulence noted. Counseled on neosporin. Will Rx ABX due to odor and purulence noted. Pt can follow up with Podiatry. Plan is to DC home with follow up to Podiatry. Pt given Norco 10s on 07-12-16 with x3 month supply. At time of discharge, Patient is in no acute distress. Vital Signs are stable. Patient is able to ambulate. Patient able to tolerate PO.    Disposition/Plan:  DC Home Additional Verbal discharge instructions given and discussed with patient.  Pt Instructed to f/u with Podiatry in the next week for evaluation and treatment of symptoms. Return precautions given Pt acknowledges and agrees with plan  Supervising Physician Veryl Speak, MD   Final diagnoses:  Foot lesion  Foot callus    New Prescriptions New Prescriptions   No medications on file     Shary Decamp, PA-C 07/14/16 2019    Veryl Speak, MD 07/14/16 [phone]

## 2016-07-14 NOTE — Progress Notes (Addendum)
Anesthesia Chart Review: Patient is a 40 year old female scheduled for right carpal tunnel release, possible hypothenar fat pad flap on 07/15/16 by Dr. Leanora Cover. Anesthesia is posted as [facility]. Procedure was initially scheduled for 05/27/16, but was postponed due to need for cardiology follow-up. In addition, Jeani Hawking at Dr. Levell July office was notified that patient had poorly controlled DM, and that he may want patient to follow-up with endocrinology as well to decrease risk of her surgery being canceled if patient arrived with a fasting glucose much over 200. (See my note from 05/26/16.) Patient reported she was never told by Dr. Levell July office to follow-up with endocrinology prior to rescheduling surgery, so this has not been done. She reports fasting glucose in the 150's, despite an A1c in the 13 range.   History includes morbid obesity (BMI > 50), former smoker, asthma, HTN, GERD, depression, Bipolar disorder, DM2, arthritis, HLD, anemia, COPD, OSA with CPAP use, wide excision of posterior neck abscess '14, chronic pain with history of drug seeking behavior, syncope (3 episodes 02/2016), history of chest pain with widely patent coronaries by 2013 cath. In March 2017 she had three syncopal episodes. She was seen in the ED on 02/28/16 for recurrent syncope. She refused CTA to evaluate for PE. She had previously been seen by her PCP who initially thought episode was due to hypovolemia as orthostatics were mildly positive, but with recurrent episode out-patient cardiology referral with echo was planned. She was seen by cardiologist Dr. Burt Knack 06/10/16 and following an unremarkable echo, he recommended observation for now with PRN cardiology follow-up.  - PCP is Dr. [name] with [facility], last visit 07/12/16 but note is not yet completed. - Endocrinologist is Dr. Loanne Drilling, last visit 12/10/15. He increased her from 600 Units to 700 Units of Lantus every morning due to CBG range of 125-500 with  A1c 13.3 which correlates with average readings > 300 (up from 12.9 on 08/05/15).  - Cardiologist is Dr. Burt Knack in the past, last visit 06/10/16 with PRN follow-upl   Meds include Lipitor, Symbicort, Coreg, Zyrtec, Flexeril, Flonase, Neurontin, Norco, Lantus 700 Units [place] Q AM, lisinopril, Nitro.  BP 138/82   Pulse 93   Temp 36.8 C   Resp 18   Ht 5\' 2"  (1.575 m)   Wt (!) 303 lb 11.2 oz (137.8 kg)   LMP 06/24/2016   SpO2 99%   BMI 55.55 kg/m   06/26/16 EKG: NSR, rightward axis, low voltage QRS, cannot rule out anterior infarct (age undetermined).No significant change since last tracing.   05/28/16 Echo: Study Conclusions - Left ventricle: The cavity size was mildly dilated. Wall   thickness was increased in a pattern of mild LVH. Systolic   function was normal. The estimated ejection fraction was in the   range of 55% to 60%. Wall motion was normal; there were no   regional wall motion abnormalities. Left ventricular diastolic   function parameters were normal. - Left atrium: The atrium was mildly dilated. - Atrial septum: No defect or patent foramen ovale was identified.  07/27/12 [facility] (Dr. Sherren Mocha): Final Conclusions:  1. Widely patent coronary arteries 2. Normal LV systolic function 3. Elevated LVEDP Recommendations: The patient likely has noncardiac chest pain. She does have elevated filling pressures and will require ongoing aggressive risk reduction with treatment of her hypertension, diabetes, and efforts at weight loss.  07/12/14 CTA head/neck: IMPRESSION: - No extracranial cause for amaurosis is observed. No dissection or atheromatous change at the  carotid bifurcations. No evidence for dissection. -No intracranial flow reducing lesion is observed. There is some calcific atheromatous change near the LEFT ICA ophthalmic origin but none is present on the RIGHT. Symmetric and unremarkable BILATERAL ophthalmic arteries. - Moderately advanced sinus disease, LEFT  greater than RIGHT, most notably LEFT maxillary sinus.  06/26/16 CXR: IMPRESSION: Stable.  No evidence of acute disease.  Preoperative labs noted. Cr 0.48. CBC WNL. Non-fasting glucose 338. A1c 13.1 (down from 13.3 on 12/09/15) with mean plasma glucose 329. She reports fasting glucose around 150. DME Barnie Alderman, RN was consulted back in June for recommendations of day of surgery insulin dose (since patient is on an exceptionally high insulin dose). There was concern that patient may not actually be absorbing that amount of insulin. She suggested 70 Units based on her weight then could resume her usual dose post-operatively.  Again, I called and spoke with Jeani Hawking at Dr. Levell July office this morning. I notified her of patient's poorly controlled DM with A1c 13.1 and that patient is at high risk to get canceled if her glucose is much over 200 tomorrow on arrival. She will discuss with Dr. Fredna Dow for his recommendations. (Update 11/51 AM: Dr. Fredna Dow will cancel surgery from 07/15/16, and have patient go back and see her endocrinologist in hopes to get her DM better controlled. Currently, patient is scheduled to see Dr. Loanne Drilling on 07/19/16. Jeani Hawking will notify patient.)  George Hugh [facility] Short Stay Center/Anesthesiology Phone [phone] 07/14/2016 9:57 AM

## 2016-07-14 NOTE — ED Notes (Signed)
Bed: WTR6 Expected date:  Expected time:  Means of arrival:  Comments: 40 yo f rt foot pain

## 2016-07-14 NOTE — Discharge Instructions (Signed)
Please read and follow all provided instructions.  Your diagnoses today include:  1. Foot lesion   2. Foot callus    Tests performed today include: Vital signs. See below for your results today.   Medications prescribed:  Take as prescribed. Place neosporin over area   Home care instructions:  Follow any educational materials contained in this packet.  Follow-up instructions: Please follow-up with your Podiatrist for further evaluation of symptoms and treatment   Return instructions:  Please return to the Emergency Department if you do not get better, if you get worse, or new symptoms OR  - Fever (temperature greater than 101.53F)  - Bleeding that does not stop with holding pressure to the area    -Severe pain (please note that you may be more sore the day after your accident)  - Chest Pain  - Difficulty breathing  - Severe nausea or vomiting  - Inability to tolerate food and liquids  - Passing out  - Skin becoming red around your wounds  - Change in mental status (confusion or lethargy)  - New numbness or weakness    Please return if you have any other emergent concerns.  Additional Information:  Your vital signs today were: BP 153/98 (BP Location: Left Arm)    Pulse 112    Temp 98.3 F (36.8 C) (Oral)    Resp 20    LMP 06/24/2016    SpO2 96%  If your blood pressure (BP) was elevated above 135/85 this visit, please have this repeated by your doctor within one month. ---------------

## 2016-07-15 ENCOUNTER — Encounter (HOSPITAL_COMMUNITY): Admission: RE | Payer: Self-pay | Source: Ambulatory Visit

## 2016-07-15 ENCOUNTER — Ambulatory Visit (HOSPITAL_COMMUNITY): Admission: RE | Admit: 2016-07-15 | Payer: Medicaid Other | Source: Ambulatory Visit | Admitting: Orthopedic Surgery

## 2016-07-15 SURGERY — CARPAL TUNNEL RELEASE
Anesthesia: Choice | Laterality: Right

## 2016-07-19 ENCOUNTER — Ambulatory Visit: Payer: Medicaid Other | Admitting: Endocrinology

## 2016-07-28 ENCOUNTER — Other Ambulatory Visit: Payer: Self-pay | Admitting: Family Medicine

## 2016-07-29 ENCOUNTER — Telehealth: Payer: Self-pay | Admitting: Endocrinology

## 2016-07-29 NOTE — Telephone Encounter (Signed)
[facility] is needing clarification of dosage of transferred prescription .  prescription says Lantus 700 units daily. Please advise  [phone] (Phone)

## 2016-07-29 NOTE — Telephone Encounter (Signed)
I contacted the patient's pharmacy and advised per Dr. Baldomero Lamy 700 units daily of lantus is the correct dosage for [name]. Appointment letter mailed to the patient advised her to call our office to schedule an appointment.

## 2016-07-29 NOTE — Telephone Encounter (Signed)
See message and please advise, Thanks!  

## 2016-07-29 NOTE — Telephone Encounter (Signed)
That is correct Ov is due

## 2016-07-30 DIAGNOSIS — M1812 Unilateral primary osteoarthritis of first carpometacarpal joint, left hand: Secondary | ICD-10-CM | POA: Insufficient documentation

## 2016-07-30 HISTORY — DX: Unilateral primary osteoarthritis of first carpometacarpal joint, left hand: M18.12

## 2016-08-02 ENCOUNTER — Encounter: Payer: Self-pay | Admitting: Podiatry

## 2016-08-02 ENCOUNTER — Ambulatory Visit (INDEPENDENT_AMBULATORY_CARE_PROVIDER_SITE_OTHER): Payer: Medicaid Other

## 2016-08-02 ENCOUNTER — Ambulatory Visit (INDEPENDENT_AMBULATORY_CARE_PROVIDER_SITE_OTHER): Payer: Medicaid Other | Admitting: Podiatry

## 2016-08-02 VITALS — BP 162/110 | HR 95 | Resp 16 | Ht 62.0 in | Wt 296.0 lb

## 2016-08-02 DIAGNOSIS — M79671 Pain in right foot: Secondary | ICD-10-CM

## 2016-08-02 DIAGNOSIS — L97401 Non-pressure chronic ulcer of unspecified heel and midfoot limited to breakdown of skin: Secondary | ICD-10-CM

## 2016-08-02 DIAGNOSIS — B351 Tinea unguium: Secondary | ICD-10-CM

## 2016-08-02 MED ORDER — AMOXICILLIN-POT CLAVULANATE 875-125 MG PO TABS: 1.0000 | ORAL_TABLET | Freq: Two times a day (BID) | ORAL | 0 refills | Status: DC

## 2016-08-02 NOTE — Progress Notes (Signed)
   Subjective:    Patient ID: [name], female    DOB: Jul 25, 1976, 40 y.o.   MRN: [id number]  HPI  Chief Complaint  Patient presents with  . Diabetic Ulcer    Right foot / medial...Pt. states painful when walking and pain runs up the ankle."         Review of Systems  Constitutional: Positive for activity change.  Neurological: Positive for numbness.       Objective:   Physical Exam        Assessment & Plan:

## 2016-08-03 ENCOUNTER — Telehealth: Payer: Self-pay | Admitting: *Deleted

## 2016-08-04 NOTE — Progress Notes (Signed)
Subjective:     Patient ID: [name], female   DOB: 10-09-1976, 40 y.o.   MRN: [id number]  HPI patient presents stating I had a irritation of my left foot and also I have this small spot on the bottom of my left big toe that I wanted to have looked at. Patient is a long-term diabetic who has significant obesity issues   Review of Systems  All other systems reviewed and are negative.      Objective:   Physical Exam  Constitutional: She is oriented to person, place, and time.  Musculoskeletal: Normal range of motion.  Neurological: She is oriented to person, place, and time.  Skin: Skin is warm and dry.  Nursing note and vitals reviewed.  neurovascular status was found to be moderately reduced with diminishment of DP PT pulses of a mild nature and diminishment of sharp [place] vibratory bilateral. Patient has depression of the arch secondary to obesity and foot structure and is noted to have a small breakdown of tissue in the mid arch area left measuring about 0.5 cm x 1 cm. It is localized with no proximal edema erythema or drainage noted and also patient is a small dark spot on the plantar aspect of the left big toe. The area of breakdown is just within skin with no significant subcutaneous exposure     Assessment:     Mild ulceration of the left arch which may be due to foot structure with obesity and long-term diabetes that's not in good control along with spot left which could be possible basal cell or other issue and is difficult to say at this time    Plan:     H&P condition reviewed and at this time reviewed the importance of good diabetic control and reduction of weight. We debrided the area and applied Iodosorb and instructed on home soaks and offloading of the breakdown in tissue and gave strict instructions if there should be any increase in redness any swelling drainage or any systemic indications of infection she is to go straight to the emergency room. Went ahead and we did  infiltrate the left hallux 60 g like Marcaine mixture and did a 3 mm punch biopsy of the lesion and we'll send off for evaluation and placed patient as precautionary measure on Augmentin 875 mg to be taken at this time

## 2016-08-12 ENCOUNTER — Telehealth: Payer: Self-pay | Admitting: *Deleted

## 2016-08-12 ENCOUNTER — Ambulatory Visit (INDEPENDENT_AMBULATORY_CARE_PROVIDER_SITE_OTHER): Payer: Medicaid Other | Admitting: Podiatry

## 2016-08-12 ENCOUNTER — Encounter: Payer: Self-pay | Admitting: Podiatry

## 2016-08-12 VITALS — BP 125/84 | HR 90 | Resp 16

## 2016-08-12 DIAGNOSIS — L97501 Non-pressure chronic ulcer of other part of unspecified foot limited to breakdown of skin: Secondary | ICD-10-CM

## 2016-08-12 DIAGNOSIS — M79671 Pain in right foot: Secondary | ICD-10-CM

## 2016-08-12 DIAGNOSIS — L97401 Non-pressure chronic ulcer of unspecified heel and midfoot limited to breakdown of skin: Secondary | ICD-10-CM

## 2016-08-12 NOTE — Telephone Encounter (Signed)
Prior auth completed, will return to [place] RN [name], MD

## 2016-08-12 NOTE — Telephone Encounter (Signed)
Prior Authorization received from [facility] for Hydrocodone-Acetaminophen 10-325 mg. Formulary and PA form placed in provider box for completion. Derl Barrow, RN

## 2016-08-13 ENCOUNTER — Ambulatory Visit: Payer: Medicaid Other | Admitting: Endocrinology

## 2016-08-13 DIAGNOSIS — Z0289 Encounter for other administrative examinations: Secondary | ICD-10-CM

## 2016-08-13 NOTE — Telephone Encounter (Signed)
PA pending for hydrocodone-Acetaminophen 10-325 mg per [place] [facility].  Derl Barrow, RN

## 2016-08-13 NOTE — Progress Notes (Signed)
Subjective:     Patient ID: [name], female   DOB: 07-11-1976, 40 y.o.   MRN: [id number]  HPI patient states that the tissue on her right arch is doing much better but she admits she does not take care of her sugar and that also she has severe obesity   Review of Systems     Objective:   Physical Exam Neurovascular status found to be diminished with breakdown of tissue in the mid arch area right that is improving quite a bit with crusted tissue and no proximal edema erythema or drainage noted. Patient has no odor emitting from this area and it is localized in nature    Assessment:     Improvement of ulceration right arch with localized changes and no indications of systemic cellulitis or systemic infection    Plan:     Reviewed condition and applied dressing around the area with Iodosorb and instructed on home soaks padding and reduced activity. Patient will try to do a better job on taking care of her sugar and we will try to reduce weight as best as possible and is given strict instructions to come in if any changes should occur

## 2016-08-13 NOTE — Telephone Encounter (Signed)
Received PA approval for hydrocodone-acetaminophen 10-325 mg via [facility].  Med approved for 08/13/16 - 02/09/17.  [facility] informed.  PA approval number [id number]. Derl Barrow, RN

## 2016-09-08 ENCOUNTER — Emergency Department (HOSPITAL_COMMUNITY): Payer: Medicaid Other

## 2016-09-08 ENCOUNTER — Encounter (HOSPITAL_COMMUNITY): Payer: Self-pay | Admitting: Emergency Medicine

## 2016-09-08 ENCOUNTER — Emergency Department (HOSPITAL_COMMUNITY)
Admission: EM | Admit: 2016-09-08 | Discharge: 2016-09-08 | Disposition: A | Discharge status: Home / Self Care | Payer: Medicaid Other | Attending: Emergency Medicine | Admitting: Emergency Medicine

## 2016-09-08 DIAGNOSIS — J449 Chronic obstructive pulmonary disease, unspecified: Secondary | ICD-10-CM | POA: Diagnosis not present

## 2016-09-08 DIAGNOSIS — Z87891 Personal history of nicotine dependence: Secondary | ICD-10-CM | POA: Diagnosis not present

## 2016-09-08 DIAGNOSIS — M25561 Pain in right knee: Secondary | ICD-10-CM | POA: Diagnosis not present

## 2016-09-08 DIAGNOSIS — Z794 Long term (current) use of insulin: Secondary | ICD-10-CM | POA: Insufficient documentation

## 2016-09-08 DIAGNOSIS — I10 Essential (primary) hypertension: Secondary | ICD-10-CM | POA: Insufficient documentation

## 2016-09-08 DIAGNOSIS — E1140 Type 2 diabetes mellitus with diabetic neuropathy, unspecified: Secondary | ICD-10-CM | POA: Diagnosis not present

## 2016-09-08 MED ORDER — NAPROXEN 500 MG PO TABS
500.0000 mg | ORAL_TABLET | Freq: Two times a day (BID) | ORAL | 0 refills | Status: DC
Start: 2016-09-08 — End: 2016-09-09

## 2016-09-08 MED ORDER — NAPROXEN 250 MG PO TABS
500.0000 mg | ORAL_TABLET | Freq: Once | ORAL | Status: AC
  Administered 2016-09-08: 500 mg via ORAL
  Filled 2016-09-08: qty 2

## 2016-09-08 NOTE — ED Notes (Signed)
Patient transported to X-ray 

## 2016-09-08 NOTE — ED Provider Notes (Signed)
[facility] DEPT Provider Note   CSN: [id number] Arrival date & time: 09/08/16  0420     History   Chief Complaint Chief Complaint  Patient presents with  . Knee Pain    HPI [name] is a 40 y.o. female.  Patient presents with right knee pain for the past one month without known injury. She states she is taking her Norco (Pain Management), 400 mg ibuprofen without relief. No swelling, no calf or thigh pain.   The history is provided by the patient. No language interpreter was used.  Knee Pain   Pertinent negatives include no numbness.    Past Medical History:  Diagnosis Date  . Alveolar hypoventilation   . Anemia    not on iron pill  . Arthritis   . Asthma   . Bipolar 2 disorder ([place])   . Carpal tunnel syndrome on right    recurrent  . Cellulitis 08/2010-08/2011  . Chronic pain   . COPD (chronic obstructive pulmonary disease) ([name])    Symbicort daily and Proventil as needed  . Costochondritis   . Depression   . Diabetes mellitus 2000   Type 2, Uncontrolled.Takes Lantus daily.Fasting blood sugar runs 150  . Dizziness    occasionally  . Drug-seeking behavior   . GERD (gastroesophageal reflux disease)    takes Pantoprazole and Zantac daily  . Headache    migraine-last one about a yr ago.Topamax daily  . HLD (hyperlipidemia)    takes Atorvastatin daily  . Hypertension    takes Lisinopril and Coreg daily  . Morbid obesity ([place])   . Muscle spasm    takes Flexeril as needed  . Nocturia   . Obstructive sleep apnea   . Peripheral neuropathy ([name])    takes Gabapentin daily  . Pneumonia    "walking" several yrs ago and as a baby  . Rectal fissure   . Restless leg   . Urinary frequency   . Varicose veins    Right medial thigh and Left leg     Patient Active Problem List   Diagnosis Date Noted  . Diabetic neuropathy ([place]) 07/14/2016  . Nausea 03/17/2016  . Syncope 02/25/2016  . Ruptured tympanic membrane 11/28/2015  . De Quervain's tenosynovitis,  bilateral 11/01/2015  . Vitamin D deficiency 09/05/2015  . Recurrent candidiasis of vagina 09/05/2015  . Varicose veins of leg with complications [id number]  . Neuropathy, intercostal nerve 12/17/2014  . Rectal itching 11/17/2014  . Restless leg syndrome 10/17/2014  . Skin infection 08/26/2014  . Chronic sinusitis 07/18/2014  . Headache 07/15/2014  . Vision, loss, sudden 07/12/2014  . Middle ear effusion 05/21/2014  . Cough 05/13/2014  . Urge incontinence 10/15/2013  . Insomnia 08/14/2013  . Encounter for chronic pain management 06/30/2013  . Low back pain 01/31/2013  . HLD (hyperlipidemia) 11/19/2012  . Chest pain 06/27/2012  . Epigastric pain 06/22/2012  . Right carpal tunnel syndrome 09/01/2011  . Bilateral knee pain 09/01/2011  . DM (diabetes mellitus), type 2, uncontrolled ([place]) 05/22/2008  . Morbid obesity ([place]) 05/22/2008  . 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 DEPRESSION 05/22/2008  . Obstructive sleep apnea 05/22/2008  . Essential hypertension 05/22/2008  . ASTHMA 05/22/2008  . GERD 05/22/2008    Past Surgical History:  Procedure Laterality Date  . CARPAL TUNNEL RELEASE    . CESAREAN SECTION    . KNEE ARTHROSCOPY Right 07/17/2010  . LEFT HEART CATHETERIZATION WITH CORONARY ANGIOGRAM N/A 07/27/2012   Procedure: LEFT HEART CATHETERIZATION WITH CORONARY ANGIOGRAM;  Surgeon: Sherren Mocha, MD;  Location[facility];  Service: Cardiovascular;  Laterality: N/A;  . left knee surgery     screws she thinks  . MASS EXCISION N/A 06/29/2013   Procedure:  WIDE LOCAL EXCISION OF POSTERIOR NECK ABSCESS;  Surgeon: Ralene Ok, MD;  Location: [facility];  Service: General;  Laterality: N/A;    OB History    Gravida Para Term Preterm AB Living   2 1     1 1    SAB TAB Ectopic Multiple Live Births   1               Home Medications    Prior to Admission medications   Medication Sig Start Date End Date Taking? Authorizing Provider  ACCU-CHEK SMARTVIEW test strip  USE TO TEST BLOOD SUGAR FOUR TIMES DAILY 06/10/16   [name], MD  amoxicillin-clavulanate (AUGMENTIN) 875-125 MG tablet Take 1 tablet by mouth 2 (two) times daily. 08/02/16   Wallene Huh, DPM  atorvastatin (LIPITOR) 20 MG tablet Take 1 tablet (20 mg total) by mouth daily. 03/22/16   [name], MD  budesonide-formoterol (SYMBICORT) 160-4.5 MCG/ACT inhaler INHALE 2 PUFFS INTO LUNGS TWICE A DAY 06/10/16   [name], MD  carvedilol (COREG) 12.5 MG tablet TAKE 1 TABLET BY MOUTH TWICE DAILY WITH A MEAL Patient taking differently: TAKE 12.5 MG BY MOUTH TWICE DAILY WITH A MEAL 08/12/15   [name], MD  cetirizine (ZYRTEC) 10 MG tablet TAKE 1 TABLET BY MOUTH DAILY Patient taking differently: TAKE 10 MG BY MOUTH DAILY AS NEEDED FOR ALLERGIES 07/16/15   [name], MD  co-enzyme Q-10 50 MG capsule Take 2 capsules (100 mg total) by mouth daily. 06/26/16   Kristen N Ward, DO  cyclobenzaprine (FLEXERIL) 10 MG tablet TAKE 1 TABLET BY MOUTH TWICE DAILY AS NEEDED FOR MUSCLE SPASMS. Patient taking differently: Take 10 mg by mouth 2 (two) times daily as needed for muscle spasms.  04/29/16   [name], MD  fluticasone (FLONASE) 50 MCG/ACT nasal spray Place 2 sprays into both nostrils daily. 05/21/14   Melony Overly, MD  gabapentin (NEURONTIN) 600 MG tablet Take 2 tablets (1,200 mg total) by mouth 3 (three) times daily. 07/12/16   [name], MD  HYDROcodone-acetaminophen (NORCO) 10-325 MG tablet Take 1 tablet by mouth every 8 (eight) hours as needed for severe pain. 07/12/16   [name], MD  Insulin Glargine (LANTUS SOLOSTAR) 100 UNIT/ML Solostar Pen Inject 700 Units into the skin every morning. And pen needles 4/day 12/09/15   [name], MD  lisinopril (PRINIVIL,ZESTRIL) 10 MG tablet Take 1 tablet (10 mg total) by mouth daily. 03/12/14   [name], MD  naproxen (NAPROSYN) 500 MG tablet Take 1 tablet (500 mg total) by mouth 2 (two) times daily.  06/26/16   Kristen N Ward, DO  nitroGLYCERIN (NITROSTAT) 0.4 MG SL tablet DISSOLVE 1 TABLET UNDER THE TONGUE EVERY 5 MINUTES AS NEEDED FOR CHEST PAIN. 10/21/15   [name], MD  ondansetron (ZOFRAN ODT) 4 MG disintegrating tablet Take 1 tablet (4 mg total) by mouth every 8 (eight) hours as needed for nausea. 03/15/16   [name], MD  pantoprazole (PROTONIX) 40 MG tablet TAKE 1 TABLET BY MOUTH EVERY DAY Patient taking differently: TAKE 40 MG BY MOUTH EVERY DAY 09/15/15   [name], MD  pramipexole (MIRAPEX) 0.125 MG tablet TAKE 3 TABLETS BY MOUTH EVERY NIGHT AT BEDTIME Patient  taking differently: TAKE 0.375 MG BY MOUTH EVERY NIGHT AT BEDTIME 11/13/15   [name], MD  PROVENTIL HFA 108 (90 BASE) MCG/ACT inhaler INHALE 1 PUFF BY MOUTH EVERY 6 HOURS AS NEEDED FOR WHEEZING OR SHORTNESS OF BREATH. 07/16/15   [name], MD  ranitidine (ZANTAC) 150 MG tablet Take 1 tablet (150 mg total) by mouth 2 (two) times daily. 03/15/16 03/15/17  [name], MD  topiramate (TOPAMAX) 25 MG tablet TAKE 1 TABLET BY MOUTH EVERY NIGHT AT BEDTIME Patient taking differently: TAKE 25 MG BY MOUTH EVERY NIGHT AT BEDTIME 09/15/15   [name], MD    Family History Family History  Problem Relation Age of Onset  . Diabetes Mother   . Hyperlipidemia Mother   . Depression Mother   . Varicose Veins Mother   . Heart attack Paternal Uncle   . Heart disease Paternal Grandmother   . Heart attack Paternal Grandmother   . Heart attack Paternal Grandfather   . Heart disease Paternal Grandfather   . Heart attack Father     Social History Social History  Substance Use Topics  . Smoking status: Former Smoker    Packs/day: 0.30    Years: 0.30    Types: Cigarettes    Quit date: 12/06/1993  . Smokeless tobacco: Never Used  . Alcohol use No     Allergies   Kiwi extract and Nubain [nalbuphine hcl]   Review of Systems Review of Systems  Constitutional: Negative  for chills and fever.  Musculoskeletal:       See HPI.  Skin: Negative.  Negative for color change.  Neurological: Negative.  Negative for weakness and numbness.     Physical Exam Updated Vital Signs BP 126/80   Pulse 91   Temp 97.6 F (36.4 C) (Oral)   Resp 16   Ht 5\' 2"  (1.575 m)   Wt 134.3 kg   LMP 08/06/2016 (Approximate)   SpO2 98%   BMI 54.14 kg/m   Physical Exam  Constitutional: She is oriented to person, place, and time. She appears well-developed and well-nourished.  Neck: Normal range of motion.  Cardiovascular: Intact distal pulses.   Pulmonary/Chest: Effort normal.  Musculoskeletal:  Right knee without swelling, redness or warmth. FROM, full strength. Joint stable.   Neurological: She is alert and oriented to person, place, and time.  Skin: Skin is warm and dry.     ED Treatments / Results  Labs (all labs ordered are listed, but only abnormal results are displayed) Labs Reviewed - No data to display  EKG  EKG Interpretation None       Radiology No results found.  Procedures Procedures (including critical care time)  Medications Ordered in ED Medications - No data to display   Initial Impression / Assessment and Plan / ED Course  I have reviewed the triage vital signs and the nursing notes.  Pertinent labs & imaging results that were available during my care of the patient were reviewed by me and considered in my medical decision making (see chart for details).  Clinical Course    Right knee pain x 1 month. Imaging negative. Discussed care with the patient. Will refer to ortho prn.   Final Clinical Impressions(s) / ED Diagnoses   Final diagnoses:  None  1. Right knee pain  New Prescriptions New Prescriptions   No medications on file     Charlann Lange, Hershal Coria [phone]    David Glick, MD [phone]

## 2016-09-08 NOTE — ED Notes (Signed)
Pt called out for pain meds 

## 2016-09-08 NOTE — ED Triage Notes (Signed)
Pt c/o R knee pain that's been going on for over a month and "just getting worser and worser." Denies trauma or specific injury. Had same knee scoped 8-69yrs ago. Says she's done typical RICE treatment and ibuprofen, without relief.

## 2016-09-08 NOTE — ED Notes (Signed)
Pt departed in NAD.  

## 2016-09-09 ENCOUNTER — Emergency Department (HOSPITAL_COMMUNITY)
Admission: EM | Admit: 2016-09-09 | Discharge: 2016-09-10 | Disposition: A | Discharge status: Home / Self Care | Payer: Medicaid Other | Attending: Emergency Medicine | Admitting: Emergency Medicine

## 2016-09-09 ENCOUNTER — Encounter (HOSPITAL_COMMUNITY): Payer: Self-pay | Admitting: *Deleted

## 2016-09-09 ENCOUNTER — Emergency Department (HOSPITAL_COMMUNITY): Payer: Medicaid Other

## 2016-09-09 DIAGNOSIS — Z87891 Personal history of nicotine dependence: Secondary | ICD-10-CM | POA: Diagnosis not present

## 2016-09-09 DIAGNOSIS — J449 Chronic obstructive pulmonary disease, unspecified: Secondary | ICD-10-CM | POA: Diagnosis not present

## 2016-09-09 DIAGNOSIS — E1140 Type 2 diabetes mellitus with diabetic neuropathy, unspecified: Secondary | ICD-10-CM | POA: Insufficient documentation

## 2016-09-09 DIAGNOSIS — Z794 Long term (current) use of insulin: Secondary | ICD-10-CM | POA: Diagnosis not present

## 2016-09-09 DIAGNOSIS — I10 Essential (primary) hypertension: Secondary | ICD-10-CM | POA: Insufficient documentation

## 2016-09-09 DIAGNOSIS — M25561 Pain in right knee: Secondary | ICD-10-CM | POA: Diagnosis not present

## 2016-09-09 MED ORDER — KETOROLAC TROMETHAMINE 30 MG/ML IJ SOLN
30.0000 mg | Freq: Once | INTRAMUSCULAR | Status: AC
  Administered 2016-09-09: 30 mg via INTRAMUSCULAR
  Filled 2016-09-09: qty 1

## 2016-09-09 MED ORDER — NAPROXEN 500 MG PO TABS
500.0000 mg | ORAL_TABLET | Freq: Two times a day (BID) | ORAL | 0 refills | Status: DC
Start: 2016-09-09 — End: 2016-10-02

## 2016-09-09 NOTE — Discharge Instructions (Signed)
As discussed, with your ongoing knee pain is important that he follow up with our orthopedic colleagues.  Your pain is likely due to inflammation and irritation, possible disruption of the cartilage in the knee joint.

## 2016-09-09 NOTE — ED Triage Notes (Signed)
Patient stated today she was walking fine and felt something pop in the back of her right knee.  States has been treated for joint pain but this is new  Seen here 2 days ago for joint pain but was not able to get prescription.  Took a Vicodin at home around 5pm

## 2016-09-09 NOTE — ED Provider Notes (Signed)
[facility] DEPT Provider Note   CSN: [id number] Arrival date & time: 09/09/16  1953     History   Chief Complaint Chief Complaint  Patient presents with  . Knee Pain    HPI [name] is a 40 y.o. female.  HPI Patient presents with worsening pain in the right knee. Pain began about one month ago without clear precipitant. Patient was seen here 2 days ago for similar pain, notes that since that evaluation she heard a distinct pop, and subsequent had worsening pain in the right posterior lateral knee. No subscore fall, no loss of sensation, no new swelling. Pain is severe, not improved with home narcotic use.  Past Medical History:  Diagnosis Date  . Alveolar hypoventilation   . Anemia    not on iron pill  . Arthritis   . Asthma   . Bipolar 2 disorder ([place])   . Carpal tunnel syndrome on right    recurrent  . Cellulitis 08/2010-08/2011  . Chronic pain   . COPD (chronic obstructive pulmonary disease) ([name])    Symbicort daily and Proventil as needed  . Costochondritis   . Depression   . Diabetes mellitus 2000   Type 2, Uncontrolled.Takes Lantus daily.Fasting blood sugar runs 150  . Dizziness    occasionally  . Drug-seeking behavior   . GERD (gastroesophageal reflux disease)    takes Pantoprazole and Zantac daily  . Headache    migraine-last one about a yr ago.Topamax daily  . HLD (hyperlipidemia)    takes Atorvastatin daily  . Hypertension    takes Lisinopril and Coreg daily  . Morbid obesity ([place])   . Muscle spasm    takes Flexeril as needed  . Nocturia   . Obstructive sleep apnea   . Peripheral neuropathy ([name])    takes Gabapentin daily  . Pneumonia    "walking" several yrs ago and as a baby  . Rectal fissure   . Restless leg   . Urinary frequency   . Varicose veins    Right medial thigh and Left leg     Patient Active Problem List   Diagnosis Date Noted  . Diabetic neuropathy ([place]) 07/14/2016  . Nausea 03/17/2016  . Syncope 02/25/2016  .  Ruptured tympanic membrane 11/28/2015  . De Quervain's tenosynovitis, bilateral 11/01/2015  . Vitamin D deficiency 09/05/2015  . Recurrent candidiasis of vagina 09/05/2015  . Varicose veins of leg with complications [id number]  . Neuropathy, intercostal nerve 12/17/2014  . Rectal itching 11/17/2014  . Restless leg syndrome 10/17/2014  . Skin infection 08/26/2014  . Chronic sinusitis 07/18/2014  . Headache 07/15/2014  . Vision, loss, sudden 07/12/2014  . Middle ear effusion 05/21/2014  . Cough 05/13/2014  . Urge incontinence 10/15/2013  . Insomnia 08/14/2013  . Encounter for chronic pain management 06/30/2013  . Low back pain 01/31/2013  . HLD (hyperlipidemia) 11/19/2012  . Chest pain 06/27/2012  . Epigastric pain 06/22/2012  . Right carpal tunnel syndrome 09/01/2011  . Bilateral knee pain 09/01/2011  . DM (diabetes mellitus), type 2, uncontrolled ([place]) 05/22/2008  . Morbid obesity ([place]) 05/22/2008  . 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 DEPRESSION 05/22/2008  . Obstructive sleep apnea 05/22/2008  . Essential hypertension 05/22/2008  . ASTHMA 05/22/2008  . GERD 05/22/2008    Past Surgical History:  Procedure Laterality Date  . CARPAL TUNNEL RELEASE    . CESAREAN SECTION    . KNEE ARTHROSCOPY Right 07/17/2010  . LEFT HEART CATHETERIZATION WITH CORONARY ANGIOGRAM N/A 07/27/2012  Procedure: LEFT HEART CATHETERIZATION WITH CORONARY ANGIOGRAM;  Surgeon: Sherren Mocha, MD;  Location: [facility] CATH LAB;  Service: Cardiovascular;  Laterality: N/A;  . left knee surgery     screws she thinks  . MASS EXCISION N/A 06/29/2013   Procedure:  WIDE LOCAL EXCISION OF POSTERIOR NECK ABSCESS;  Surgeon: Ralene Ok, MD;  Location: [facility];  Service: General;  Laterality: N/A;    OB History    Gravida Para Term Preterm AB Living   2 1     1 1    SAB TAB Ectopic Multiple Live Births   1               Home Medications    Prior to Admission medications   Medication Sig Start Date  End Date Taking? Authorizing Provider  ACCU-CHEK SMARTVIEW test strip USE TO TEST BLOOD SUGAR FOUR TIMES DAILY 06/10/16   [name], MD  amoxicillin-clavulanate (AUGMENTIN) 875-125 MG tablet Take 1 tablet by mouth 2 (two) times daily. 08/02/16   Wallene Huh, DPM  atorvastatin (LIPITOR) 20 MG tablet Take 1 tablet (20 mg total) by mouth daily. 03/22/16   [name], MD  budesonide-formoterol (SYMBICORT) 160-4.5 MCG/ACT inhaler INHALE 2 PUFFS INTO LUNGS TWICE A DAY 06/10/16   [name], MD  carvedilol (COREG) 12.5 MG tablet TAKE 1 TABLET BY MOUTH TWICE DAILY WITH A MEAL Patient taking differently: TAKE 12.5 MG BY MOUTH TWICE DAILY WITH A MEAL 08/12/15   [name], MD  cetirizine (ZYRTEC) 10 MG tablet TAKE 1 TABLET BY MOUTH DAILY Patient taking differently: TAKE 10 MG BY MOUTH DAILY AS NEEDED FOR ALLERGIES 07/16/15   [name], MD  co-enzyme Q-10 50 MG capsule Take 2 capsules (100 mg total) by mouth daily. 06/26/16   Kristen N Ward, DO  cyclobenzaprine (FLEXERIL) 10 MG tablet TAKE 1 TABLET BY MOUTH TWICE DAILY AS NEEDED FOR MUSCLE SPASMS. Patient taking differently: Take 10 mg by mouth 2 (two) times daily as needed for muscle spasms.  04/29/16   [name], MD  fluticasone (FLONASE) 50 MCG/ACT nasal spray Place 2 sprays into both nostrils daily. 05/21/14   Melony Overly, MD  gabapentin (NEURONTIN) 600 MG tablet Take 2 tablets (1,200 mg total) by mouth 3 (three) times daily. 07/12/16   [name], MD  HYDROcodone-acetaminophen (NORCO) 10-325 MG tablet Take 1 tablet by mouth every 8 (eight) hours as needed for severe pain. 07/12/16   [name], MD  Insulin Glargine (LANTUS SOLOSTAR) 100 UNIT/ML Solostar Pen Inject 700 Units into the skin every morning. And pen needles 4/day 12/09/15   [name], MD  lisinopril (PRINIVIL,ZESTRIL) 10 MG tablet Take 1 tablet (10 mg total) by mouth daily. 03/12/14   [name], MD  naproxen (NAPROSYN) 500  MG tablet Take 1 tablet (500 mg total) by mouth 2 (two) times daily. 06/26/16   Kristen N Ward, DO  naproxen (NAPROSYN) 500 MG tablet Take 1 tablet (500 mg total) by mouth 2 (two) times daily. 09/08/16   Charlann Lange, PA-C  nitroGLYCERIN (NITROSTAT) 0.4 MG SL tablet DISSOLVE 1 TABLET UNDER THE TONGUE EVERY 5 MINUTES AS NEEDED FOR CHEST PAIN. 10/21/15   [name], MD  ondansetron (ZOFRAN ODT) 4 MG disintegrating tablet Take 1 tablet (4 mg total) by mouth every 8 (eight) hours as needed for nausea. 03/15/16   [name], MD  pantoprazole (PROTONIX) 40 MG tablet TAKE 1 TABLET BY MOUTH EVERY DAY Patient  taking differently: TAKE 40 MG BY MOUTH EVERY DAY 09/15/15   [name], MD  pramipexole (MIRAPEX) 0.125 MG tablet TAKE 3 TABLETS BY MOUTH EVERY NIGHT AT BEDTIME Patient taking differently: TAKE 0.375 MG BY MOUTH EVERY NIGHT AT BEDTIME 11/13/15   [name], MD  PROVENTIL HFA 108 (90 BASE) MCG/ACT inhaler INHALE 1 PUFF BY MOUTH EVERY 6 HOURS AS NEEDED FOR WHEEZING OR SHORTNESS OF BREATH. 07/16/15   [name], MD  ranitidine (ZANTAC) 150 MG tablet Take 1 tablet (150 mg total) by mouth 2 (two) times daily. 03/15/16 03/15/17  [name], MD  topiramate (TOPAMAX) 25 MG tablet TAKE 1 TABLET BY MOUTH EVERY NIGHT AT BEDTIME Patient taking differently: TAKE 25 MG BY MOUTH EVERY NIGHT AT BEDTIME 09/15/15   [name], MD    Family History Family History  Problem Relation Age of Onset  . Diabetes Mother   . Hyperlipidemia Mother   . Depression Mother   . Varicose Veins Mother   . Heart attack Paternal Uncle   . Heart disease Paternal Grandmother   . Heart attack Paternal Grandmother   . Heart attack Paternal Grandfather   . Heart disease Paternal Grandfather   . Heart attack Father     Social History Social History  Substance Use Topics  . Smoking status: Former Smoker    Packs/day: 0.30    Years: 0.30    Types: Cigarettes    Quit date:  12/06/1993  . Smokeless tobacco: Never Used  . Alcohol use No     Allergies   Kiwi extract and Nubain [nalbuphine hcl]   Review of Systems Review of Systems  Constitutional:       Per HPI, otherwise negative  HENT:       Per HPI, otherwise negative  Respiratory:       Per HPI, otherwise negative  Cardiovascular:       Per HPI, otherwise negative  Gastrointestinal: Negative for vomiting.  Endocrine:       Negative aside from HPI  Genitourinary:       Neg aside from HPI   Musculoskeletal:       Per HPI, otherwise negative  Skin: Negative.  Negative for color change.  Neurological: Negative for syncope.     Physical Exam Updated Vital Signs BP (!) 163/110 (BP Location: Left Arm)   Pulse 112   Temp 98 F (36.7 C) (Oral)   Resp 20   Ht 5\' 2"  (1.575 m)   Wt 296 lb (134.3 kg)   LMP 09/09/2016   SpO2 99%   BMI 54.14 kg/m   Physical Exam  Constitutional: She is oriented to person, place, and time.  Uncomfortable appearing morbidly obese female resting in supine position  HENT:  Head: Normocephalic and atraumatic.  Eyes: Conjunctivae and EOM are normal.  Cardiovascular: Normal rate and regular rhythm.   Pulmonary/Chest: Effort normal and breath sounds normal. No stridor. No respiratory distress.  Abdominal: She exhibits no distension.  Musculoskeletal: She exhibits no edema.       Right hip: Normal.       Right knee: She exhibits decreased range of motion. She exhibits no swelling, no effusion, no ecchymosis, no deformity, no laceration, no erythema, normal alignment and no LCL laxity. Tenderness found.       Right ankle: Normal.       Legs: Neurological: She is alert and oriented to person, place, and time. No cranial nerve deficit.  Skin: Skin is warm  and dry.  Psychiatric: She has a normal mood and affect.  Nursing note and vitals reviewed.    ED Treatments / Results  Labs (all labs ordered are listed, but only abnormal results are displayed) Labs Reviewed  - No data to display  EKG  EKG Interpretation None       Radiology Dg Knee Complete 4 Views Right  Result Date: 09/08/2016 CLINICAL DATA:  Right knee pain for 1 month.  No injury. EXAM: RIGHT KNEE - COMPLETE 4+ VIEW COMPARISON:  05/09/2012 FINDINGS: Mild degenerative changes in the right knee with medial greater than lateral compartment narrowing an tricompartment osteophyte formation. No evidence of acute fracture or dislocation. There appears to be a loose body in the medial aspect of the lateral compartment anteriorly. No significant effusion. Soft tissue calcifications adjacent to the proximal tibia without change since prior study. IMPRESSION: Mild degenerative changes in the right knee with apparent loose body in the lateral compartment. No acute fracture or dislocation. Electronically Signed   By: Lucienne Capers M.D.   On: 09/08/2016 06:07    Procedures Procedures (including critical care time)  Medications Ordered in ED Medications  ketorolac (TORADOL) 30 MG/ML injection 30 mg (not administered)   Chart review notable for evaluation 2 days ago, with x-ray suggesting possible loose foreign body in the knee.   Initial Impression / Assessment and Plan / ED Course  I have reviewed the triage vital signs and the nursing notes.  Pertinent labs & imaging results that were available during my care of the patient were reviewed by me and considered in my medical decision making (see chart for details).  Clinical Course    On repeat exam the patient is resting in left lateral decubitus position. We discussed all findings, patient's companion present. Patient will receive Ace wrap, follow-up with orthopedics.  Patient presenting with ongoing knee pain, worse over the past day, after audible pop. Patient's physical exam, history suggestive of cartilaginous disorder. No distal neurovascular deficits. Patient discharged to follow-up with orthopedics.    Carmin Muskrat,  MD 09/09/16 [id number]

## 2016-09-09 NOTE — ED Notes (Addendum)
Patient came to room in wheelchair stated she would get into gown for MD to see her

## 2016-09-10 NOTE — ED Notes (Signed)
Patient Alert and oriented X4. Stable and ambulatory. Patient verbalized understanding of the discharge instructions.  Patient belongings were taken by the patient.  

## 2016-09-14 ENCOUNTER — Ambulatory Visit: Payer: Medicaid Other | Admitting: Family Medicine

## 2016-09-15 ENCOUNTER — Ambulatory Visit: Payer: Medicaid Other | Admitting: Endocrinology

## 2016-09-20 ENCOUNTER — Ambulatory Visit (INDEPENDENT_AMBULATORY_CARE_PROVIDER_SITE_OTHER): Payer: Medicaid Other | Admitting: Orthopaedic Surgery

## 2016-09-20 ENCOUNTER — Other Ambulatory Visit: Payer: Self-pay | Admitting: Family Medicine

## 2016-09-20 DIAGNOSIS — M25561 Pain in right knee: Secondary | ICD-10-CM

## 2016-09-21 ENCOUNTER — Other Ambulatory Visit: Payer: Medicaid Other

## 2016-09-21 ENCOUNTER — Ambulatory Visit (INDEPENDENT_AMBULATORY_CARE_PROVIDER_SITE_OTHER): Payer: Medicaid Other | Admitting: Family Medicine

## 2016-09-21 VITALS — BP 152/96 | HR 107 | Temp 97.7°F | Ht 62.0 in | Wt 307.6 lb

## 2016-09-21 DIAGNOSIS — K219 Gastro-esophageal reflux disease without esophagitis: Secondary | ICD-10-CM

## 2016-09-21 DIAGNOSIS — I10 Essential (primary) hypertension: Secondary | ICD-10-CM | POA: Diagnosis not present

## 2016-09-21 DIAGNOSIS — F1190 Opioid use, unspecified, uncomplicated: Secondary | ICD-10-CM

## 2016-09-21 DIAGNOSIS — L089 Local infection of the skin and subcutaneous tissue, unspecified: Secondary | ICD-10-CM | POA: Diagnosis not present

## 2016-09-21 DIAGNOSIS — E785 Hyperlipidemia, unspecified: Secondary | ICD-10-CM

## 2016-09-21 DIAGNOSIS — E1142 Type 2 diabetes mellitus with diabetic polyneuropathy: Secondary | ICD-10-CM

## 2016-09-21 DIAGNOSIS — Z7189 Other specified counseling: Secondary | ICD-10-CM | POA: Diagnosis not present

## 2016-09-21 DIAGNOSIS — E559 Vitamin D deficiency, unspecified: Secondary | ICD-10-CM

## 2016-09-21 DIAGNOSIS — R51 Headache: Secondary | ICD-10-CM | POA: Diagnosis not present

## 2016-09-21 DIAGNOSIS — G2581 Restless legs syndrome: Secondary | ICD-10-CM | POA: Diagnosis not present

## 2016-09-21 DIAGNOSIS — Z23 Encounter for immunization: Secondary | ICD-10-CM

## 2016-09-21 DIAGNOSIS — G8929 Other chronic pain: Secondary | ICD-10-CM

## 2016-09-21 MED ORDER — HYDROCODONE-ACETAMINOPHEN 10-325 MG PO TABS: 1.0000 | ORAL_TABLET | Freq: Three times a day (TID) | ORAL | 0 refills | Status: DC | PRN

## 2016-09-21 NOTE — Patient Instructions (Signed)
Follow up in 3 months Pneumonia shot today On your way out, schedule an appointment one morning to come back for fasting labs. Do not eat or drink anything other than water the morning of your lab appointment until after your labs are drawn.   Be well, Dr. [name]

## 2016-09-21 NOTE — Progress Notes (Signed)
Date of Visit: 09/21/2016   HPI:  Patient presents for routine follow up  Chronic pain - doing well but knee pain has flared up recently. Saw ortho yesterday and got cortisone shot in  Knee. It has helped some. At last visit we increased gabapentin to 1200mg  three times daily, which has helped. With knee pain flaring up recently hasn't been able to exercise quite as much. Pain medicine continues to help with function. Discussed safe storage with patient - she keeps it by her bed. Her 78-22 year old daughter sometimes brings her the medicine if she needs it. We talked about how this may need to change especially once her daughter enters into her preteen and teenage years - and necessity of safe storage.  Vit D - has not had level checked in a while. Not on repletion presently.  Has appointment with endocrinology on Monday for diabetes management.  GERD - well controlled on present medications   ROS: See HPI.  [place]: history of chronic pain, hypertension, asthma, type 2 diabetes, hyperlipidemia, GERD  PHYSICAL EXAM: BP (!) 152/96   Pulse (!) 107   Temp 97.7 F (36.5 C) (Oral)   Ht 5\' 2"  (1.575 m)   Wt (!) 307 lb 9.6 oz (139.5 kg)   LMP 09/09/2016   BMI 56.26 kg/m  Gen: NAD, pleasant, cooperative HEENT: normocephalic, atraumatic, mmm  Ext: bilateral knees without effusion. No appreciable lower extremity edema bilaterally   ASSESSMENT/PLAN:  Health maintenance:  -Offered flu shot to patient today, but pt declined -last eye visit January 2017, asked to have records sent -pap - gets at [facility] given today  Encounter for chronic pain management Stable. Refill norco x3 months  Controlled Substance Database reviewed, findings are appropriate.  UDS today Emphasized importance of safe storage of medications, especially as her daughter approaches preteen years  GERD Well controlled. Continue current regimen.   HLD (hyperlipidemia) Check lipid panel at fasting  lab appointment   Vitamin D deficiency Check vit D level today  Diabetic neuropathy ([name]) Improved on present dose of gabapentin  Essential hypertension Uncontrolled. Unclear if took medications so far today Return for nurse blood pressure recheck at same time as lab visit If blood pressure still up at that time, may need to adjust medications   FOLLOW UP: Follow up in 3 mos for chronic medical problems Schedule lab visit & nurse visit for BP check  [name], [place]

## 2016-09-26 NOTE — Assessment & Plan Note (Signed)
Stable. Refill norco x3 months [place] Controlled Substance Database reviewed, findings are appropriate.  UDS today Emphasized importance of safe storage of medications, especially as her daughter approaches preteen years

## 2016-09-26 NOTE — Assessment & Plan Note (Signed)
Uncontrolled. Unclear if took medications so far today Return for nurse blood pressure recheck at same time as lab visit If blood pressure still up at that time, may need to adjust medications

## 2016-09-26 NOTE — Assessment & Plan Note (Signed)
Check vit D level today   

## 2016-09-26 NOTE — Assessment & Plan Note (Addendum)
Check lipid panel at fasting lab appointment

## 2016-09-26 NOTE — Assessment & Plan Note (Signed)
Well-controlled.  Continue current regimen. 

## 2016-09-26 NOTE — Assessment & Plan Note (Signed)
Improved on present dose of gabapentin

## 2016-09-27 ENCOUNTER — Encounter: Payer: Self-pay | Admitting: Endocrinology

## 2016-09-27 ENCOUNTER — Ambulatory Visit (INDEPENDENT_AMBULATORY_CARE_PROVIDER_SITE_OTHER): Payer: Medicaid Other | Admitting: Endocrinology

## 2016-09-27 VITALS — BP 132/86 | HR 103 | Ht 62.0 in | Wt 304.0 lb

## 2016-09-27 DIAGNOSIS — IMO0001 Reserved for inherently not codable concepts without codable children: Secondary | ICD-10-CM

## 2016-09-27 DIAGNOSIS — E1165 Type 2 diabetes mellitus with hyperglycemia: Secondary | ICD-10-CM | POA: Diagnosis not present

## 2016-09-27 LAB — POCT GLYCOSYLATED HEMOGLOBIN (HGB A1C): HEMOGLOBIN A1C: 13.1

## 2016-09-27 MED ORDER — GLUCOSE BLOOD VI STRP: ORAL_STRIP | 11 refills | Status: DC

## 2016-09-27 MED ORDER — INSULIN GLARGINE 100 UNIT/ML SOLOSTAR PEN: 700.0000 [IU] | PEN_INJECTOR | Freq: Every morning | SUBCUTANEOUS | 11 refills | Status: DC

## 2016-09-27 MED ORDER — ALBIGLUTIDE 30 MG ~~LOC~~ PEN
30.0000 mg | PEN_INJECTOR | SUBCUTANEOUS | 11 refills | Status: DC
Start: 2016-09-27 — End: 2016-11-05

## 2016-09-27 MED ORDER — ACCU-CHEK NANO SMARTVIEW W/DEVICE KIT: 1.0000 | PACK | 0 refills | Status: DC

## 2016-09-27 NOTE — Progress Notes (Signed)
Subjective:    Patient ID: [name], female    DOB: [birth year], 40 y.o.   MRN: [id number]  HPI Pt returns for f/u of diabetes mellitus: DM type: Insulin-requiring type 2 Dx'ed: 99991111 Complications: polyneuropathy. Therapy: insulin since 2007 pregnancy. DKA: never. Severe hypoglycemia: never.   Pancreatitis: never.   Other: she takes QD insulin, after poor results with multiple daily injections; she has severe insulin resistance; she can't have weight-loss surgery, due to having medicaid; she did not tolerate metformin (nausea); she does not take birth control.   Interval history: She take lantus, 700 units qam.  Pt says she never misses it.  no cbg record, but states cbg's vary from 70-500.  It is in general higher as the day goes on.  She says the ulcer at the right foot is much better, since she has gone to wound care.  She is hesitant to increase the insulin any further.   Past Medical History:  Diagnosis Date  . Alveolar hypoventilation   . Anemia    not on iron pill  . Arthritis   . Asthma   . Bipolar 2 disorder ([place])   . Carpal tunnel syndrome on right    recurrent  . Cellulitis 08/2010-08/2011  . Chronic pain   . COPD (chronic obstructive pulmonary disease) ([name])    Symbicort daily and Proventil as needed  . Costochondritis   . Depression   . Diabetes mellitus 2000   Type 2, Uncontrolled.Takes Lantus daily.Fasting blood sugar runs 150  . Dizziness    occasionally  . Drug-seeking behavior   . GERD (gastroesophageal reflux disease)    takes Pantoprazole and Zantac daily  . Headache    migraine-last one about a yr ago.Topamax daily  . HLD (hyperlipidemia)    takes Atorvastatin daily  . Hypertension    takes Lisinopril and Coreg daily  . Morbid obesity ([place])   . Muscle spasm    takes Flexeril as needed  . Nocturia   . Obstructive sleep apnea   . Peripheral neuropathy ([name])    takes Gabapentin daily  . Pneumonia    "walking" several yrs ago and as a baby  .  Rectal fissure   . Restless leg   . Urinary frequency   . Varicose veins    Right medial thigh and Left leg     Past Surgical History:  Procedure Laterality Date  . CARPAL TUNNEL RELEASE    . CESAREAN SECTION    . KNEE ARTHROSCOPY Right 07/17/2010  . LEFT HEART CATHETERIZATION WITH CORONARY ANGIOGRAM N/A 07/27/2012   Procedure: LEFT HEART CATHETERIZATION WITH CORONARY ANGIOGRAM;  Surgeon: Sherren Mocha, MD;  Location[facility] LAB;  Service: Cardiovascular;  Laterality: N/A;  . left knee surgery     screws she thinks  . MASS EXCISION N/A 06/29/2013   Procedure:  WIDE LOCAL EXCISION OF POSTERIOR NECK ABSCESS;  Surgeon: Ralene Ok, MD;  Location: [facility];  Service: General;  Laterality: N/A;    Social History   Social History  . Marital status: Married    Spouse name: N/A  . Number of children: N/A  . Years of education: N/A   Occupational History  . Not on file.   Social History Main Topics  . Smoking status: Former Smoker    Packs/day: 0.30    Years: 0.30    Types: Cigarettes    Quit date: 12/06/1993  . Smokeless tobacco: Never Used  . Alcohol use No  . Drug use: No  .  Sexual activity: Yes    Partners: Male   Other Topics Concern  . Not on file   Social History Narrative   Lives in [place] with her fiance and 38 yr old dtr.    Current Outpatient Prescriptions on File Prior to Visit  Medication Sig Dispense Refill  . atorvastatin (LIPITOR) 20 MG tablet Take 1 tablet (20 mg total) by mouth daily. 90 tablet 3  . budesonide-formoterol (SYMBICORT) 160-4.5 MCG/ACT inhaler INHALE 2 PUFFS INTO LUNGS TWICE A DAY 10.2 g 5  . carvedilol (COREG) 12.5 MG tablet TAKE 1 TABLET BY MOUTH TWICE DAILY WITH A MEAL (Patient taking differently: TAKE 12.5 MG BY MOUTH TWICE DAILY WITH A MEAL) 60 tablet 5  . cetirizine (ZYRTEC) 10 MG tablet TAKE 1 TABLET BY MOUTH DAILY (Patient taking differently: TAKE 10 MG BY MOUTH DAILY AS NEEDED FOR ALLERGIES) 30 tablet 11  . cyclobenzaprine (FLEXERIL) 10  MG tablet Take 1 tablet (10 mg total) by mouth daily as needed for muscle spasms. 30 tablet 3  . fluticasone (FLONASE) 50 MCG/ACT nasal spray Place 2 sprays into both nostrils daily. 16 g 6  . gabapentin (NEURONTIN) 600 MG tablet Take 2 tablets (1,200 mg total) by mouth 3 (three) times daily. 180 tablet 0  . HYDROcodone-acetaminophen (NORCO) 10-325 MG tablet Take 1 tablet by mouth every 8 (eight) hours as needed for severe pain. 90 tablet 0  . lisinopril (PRINIVIL,ZESTRIL) 10 MG tablet Take 1 tablet (10 mg total) by mouth daily. 30 tablet 5  . naproxen (NAPROSYN) 500 MG tablet Take 1 tablet (500 mg total) by mouth 2 (two) times daily. 14 tablet 0  . nitroGLYCERIN (NITROSTAT) 0.4 MG SL tablet DISSOLVE 1 TABLET UNDER THE TONGUE EVERY 5 MINUTES AS NEEDED FOR CHEST PAIN. 30 tablet 1  . pantoprazole (PROTONIX) 40 MG tablet TAKE 1 TABLET BY MOUTH EVERY DAY (Patient taking differently: TAKE 40 MG BY MOUTH EVERY DAY) 90 tablet 3  . pramipexole (MIRAPEX) 0.125 MG tablet TAKE 3 TABLETS BY MOUTH EVERY NIGHT AT BEDTIME (Patient taking differently: TAKE 0.375 MG BY MOUTH EVERY NIGHT AT BEDTIME) 270 tablet 1  . PROVENTIL HFA 108 (90 BASE) MCG/ACT inhaler INHALE 1 PUFF BY MOUTH EVERY 6 HOURS AS NEEDED FOR WHEEZING OR SHORTNESS OF BREATH. 6.7 g 11  . ranitidine (ZANTAC) 150 MG tablet Take 1 tablet (150 mg total) by mouth 2 (two) times daily. 60 tablet 1  . topiramate (TOPAMAX) 25 MG tablet TAKE 1 TABLET BY MOUTH EVERY NIGHT AT BEDTIME (Patient taking differently: TAKE 25 MG BY MOUTH EVERY NIGHT AT BEDTIME) 30 tablet 3   No current facility-administered medications on file prior to visit.     Allergies  Allergen Reactions  . Kiwi Extract Shortness Of Breath  . Nubain [Nalbuphine Hcl] Other (See Comments)    "FEELS LIKE SOMETHING CRAWLING ON ME"    Family History  Problem Relation Age of Onset  . Diabetes Mother   . Hyperlipidemia Mother   . Depression Mother   . Varicose Veins Mother   . Heart attack  Paternal Uncle   . Heart disease Paternal Grandmother   . Heart attack Paternal Grandmother   . Heart attack Paternal Grandfather   . Heart disease Paternal Grandfather   . Heart attack Father     BP 132/86   Pulse (!) 103   Ht 5\' 2"  (1.575 m)   Wt (!) 304 lb (137.9 kg)   LMP 09/09/2016   SpO2 97%   BMI 55.60 kg/m  Review of Systems shje denies hypoglycemia    Objective:   Physical Exam VITAL SIGNS:  See vs page GENERAL: no distress.  Morbid obesity. Pulses: dorsalis pedis intact bilat.   MSK: no deformity of the feet CV: no leg edema.   Skin:  There is a healing ulcer at the medial aspect of the right foot.  normal temp on the feet.  Patchy hyperpigmentation on the feet.  Neuro: sensation is intact to touch on the feet, but decreased from normal.     Lab Results  Component Value Date   HGBA1C 13.1 09/27/2016   Lab Results  Component Value Date   CREATININE 0.48 07/13/2016   BUN 12 07/13/2016   NA 135 07/13/2016   K 4.2 07/13/2016   CL 98 (L) 07/13/2016   CO2 28 07/13/2016      Assessment & Plan:  Insulin-requiring type 2 DM, with polyneuropathy, ongoing poor control Foot ulcer, new to me, but improved per pt.  Patient is advised the following: Patient Instructions  Please continue the same lantus check your blood sugar twice a day.  vary the time of day when you check, between before the 3 meals, and at bedtime.  also check if you have symptoms of your blood sugar being too high or too low.  please keep a record of the readings and bring it to your next appointment here.  You can write it on any piece of paper.  please call us sooner if your blood sugar goes below 70, or if you have a lot of readings over 200.  I have sent a prescription to your pharmacy, to add "Tanzeum." In view of your medical condition, you should avoid pregnancy until we have decided it is safe.   Please continue to carefully watch the healing ulcer on your right foot, and call Dr  [name] if it does not continue to improve.  Please come back for a follow-up appointment in 3 months.

## 2016-09-27 NOTE — Patient Instructions (Addendum)
Please continue the same lantus check your blood sugar twice a day.  vary the time of day when you check, between before the 3 meals, and at bedtime.  also check if you have symptoms of your blood sugar being too high or too low.  please keep a record of the readings and bring it to your next appointment here.  You can write it on any piece of paper.  please call us sooner if your blood sugar goes below 70, or if you have a lot of readings over 200.  I have sent a prescription to your pharmacy, to add "Tanzeum." In view of your medical condition, you should avoid pregnancy until we have decided it is safe.   Please continue to carefully watch the healing ulcer on your right foot, and call Dr [name] if it does not continue to improve.  Please come back for a follow-up appointment in 3 months.

## 2016-09-28 ENCOUNTER — Other Ambulatory Visit: Payer: Medicaid Other

## 2016-09-30 ENCOUNTER — Telehealth (INDEPENDENT_AMBULATORY_CARE_PROVIDER_SITE_OTHER): Payer: Self-pay | Admitting: Orthopaedic Surgery

## 2016-09-30 ENCOUNTER — Telehealth: Payer: Self-pay

## 2016-09-30 ENCOUNTER — Telehealth: Payer: Self-pay | Admitting: Endocrinology

## 2016-09-30 DIAGNOSIS — M25561 Pain in right knee: Secondary | ICD-10-CM

## 2016-09-30 NOTE — Telephone Encounter (Signed)
Per your previous note in [place] on 09/20/16 (Right knee pain) you said "I will see her back in 3 weeks to see how she is doing.  If not better, I will consider an MRI."  Do you want to see Patient first or would you like for me to put the order in? If yes, what would you like the order to be ? Please advise. thanks

## 2016-09-30 NOTE — Telephone Encounter (Signed)
Patient wants to know if she can get an MRI setup or do she need to be seen before she can get it done.  Contact Info: [phone]

## 2016-09-30 NOTE — Telephone Encounter (Signed)
Go ahead and get the MRI

## 2016-09-30 NOTE — Telephone Encounter (Signed)
Patient called and stated the Tanzeum 30 mg pen was not approved through medicaid. I contacted medicaid and obtained authorization. PA is current through 09/30/2017. Id number is [id number]. Call reference number is [id number]. Patient notified of approval.

## 2016-10-01 NOTE — Telephone Encounter (Signed)
Mri order made. Called pt. Pt aware.

## 2016-10-02 ENCOUNTER — Emergency Department (HOSPITAL_COMMUNITY)
Admission: EM | Admit: 2016-10-02 | Discharge: 2016-10-02 | Disposition: A | Discharge status: Home / Self Care | Payer: Medicaid Other | Attending: Emergency Medicine | Admitting: Emergency Medicine

## 2016-10-02 ENCOUNTER — Encounter (HOSPITAL_COMMUNITY): Payer: Self-pay | Admitting: Emergency Medicine

## 2016-10-02 DIAGNOSIS — I10 Essential (primary) hypertension: Secondary | ICD-10-CM | POA: Diagnosis not present

## 2016-10-02 DIAGNOSIS — Z794 Long term (current) use of insulin: Secondary | ICD-10-CM | POA: Insufficient documentation

## 2016-10-02 DIAGNOSIS — Z955 Presence of coronary angioplasty implant and graft: Secondary | ICD-10-CM | POA: Insufficient documentation

## 2016-10-02 DIAGNOSIS — G8929 Other chronic pain: Secondary | ICD-10-CM | POA: Insufficient documentation

## 2016-10-02 DIAGNOSIS — Z79899 Other long term (current) drug therapy: Secondary | ICD-10-CM | POA: Insufficient documentation

## 2016-10-02 DIAGNOSIS — E1140 Type 2 diabetes mellitus with diabetic neuropathy, unspecified: Secondary | ICD-10-CM | POA: Diagnosis not present

## 2016-10-02 DIAGNOSIS — J449 Chronic obstructive pulmonary disease, unspecified: Secondary | ICD-10-CM | POA: Diagnosis not present

## 2016-10-02 DIAGNOSIS — Z87891 Personal history of nicotine dependence: Secondary | ICD-10-CM | POA: Diagnosis not present

## 2016-10-02 DIAGNOSIS — Z7984 Long term (current) use of oral hypoglycemic drugs: Secondary | ICD-10-CM | POA: Insufficient documentation

## 2016-10-02 DIAGNOSIS — M25561 Pain in right knee: Secondary | ICD-10-CM | POA: Insufficient documentation

## 2016-10-02 DIAGNOSIS — M25562 Pain in left knee: Secondary | ICD-10-CM | POA: Diagnosis not present

## 2016-10-02 MED ORDER — DICLOFENAC SODIUM 75 MG PO TBEC: 75.0000 mg | DELAYED_RELEASE_TABLET | Freq: Two times a day (BID) | ORAL | 0 refills | Status: DC

## 2016-10-02 MED ORDER — OXYCODONE-ACETAMINOPHEN 5-325 MG PO TABS
1.0000 | ORAL_TABLET | Freq: Once | ORAL | Status: AC
Start: 2016-10-02 — End: 2016-10-02
  Administered 2016-10-02: 1 via ORAL
  Filled 2016-10-02: qty 1

## 2016-10-02 NOTE — ED Notes (Signed)
Pt here for eval of right knee pain, onset 2 months ago, has been for same and no improvement in pain.

## 2016-10-02 NOTE — ED Triage Notes (Signed)
Pt. reports persistent chronic pain at right knee with swelling for several weeks , denies injury , ambulatory , no fever or chills.

## 2016-10-02 NOTE — ED Notes (Signed)
RN Jolayne Haines informed of pt blood pressure

## 2016-10-02 NOTE — ED Notes (Signed)
Attempted toplace knee immobilizer and pt still able to bend her knee Ortho tech paged and will come and resize.

## 2016-10-02 NOTE — ED Provider Notes (Signed)
[name] DEPT Provider Note   CSN: [id number] Arrival date & time: 10/02/16  0450     History   Chief Complaint Chief Complaint  Patient presents with  . Knee Pain    HPI [name] is a 40 y.o. female.  Patient presents for evaluation of knee pain. Patient has a history of chronic bilateral knee pain. This is her third visit to the ER with complaints of right knee pain. She reports that she has followed up with Dr. Erlinda Hong, at [facility] orthopedics as well. She had a cortisone injection and is to be scheduled for MRI. Patient reports that the pain worsened overnight. Denies any injury. She has not had any fever.      Past Medical History:  Diagnosis Date  . Alveolar hypoventilation   . Anemia    not on iron pill  . Arthritis   . Asthma   . Bipolar 2 disorder ([place])   . Carpal tunnel syndrome on right    recurrent  . Cellulitis 08/2010-08/2011  . Chronic pain   . COPD (chronic obstructive pulmonary disease) ([name])    Symbicort daily and Proventil as needed  . Costochondritis   . Depression   . Diabetes mellitus 2000   Type 2, Uncontrolled.Takes Lantus daily.Fasting blood sugar runs 150  . Dizziness    occasionally  . Drug-seeking behavior   . GERD (gastroesophageal reflux disease)    takes Pantoprazole and Zantac daily  . Headache    migraine-last one about a yr ago.Topamax daily  . HLD (hyperlipidemia)    takes Atorvastatin daily  . Hypertension    takes Lisinopril and Coreg daily  . Morbid obesity ([place])   . Muscle spasm    takes Flexeril as needed  . Nocturia   . Obstructive sleep apnea   . Peripheral neuropathy ([name])    takes Gabapentin daily  . Pneumonia    "walking" several yrs ago and as a baby  . Rectal fissure   . Restless leg   . Urinary frequency   . Varicose veins    Right medial thigh and Left leg     Patient Active Problem List   Diagnosis Date Noted  . Diabetic neuropathy ([place]) 07/14/2016  . De Quervain's tenosynovitis, bilateral  11/01/2015  . Vitamin D deficiency 09/05/2015  . Recurrent candidiasis of vagina 09/05/2015  . Varicose veins of leg with complications 32/67/1245  . Neuropathy, intercostal nerve 12/17/2014  . Rectal itching 11/17/2014  . Restless leg syndrome 10/17/2014  . Chronic sinusitis 07/18/2014  . Headache 07/15/2014  . Vision, loss, sudden 07/12/2014  . Urge incontinence 10/15/2013  . Encounter for chronic pain management 06/30/2013  . Low back pain 01/31/2013  . HLD (hyperlipidemia) 11/19/2012  . Chest pain 06/27/2012  . Right carpal tunnel syndrome 09/01/2011  . Bilateral knee pain 09/01/2011  . DM (diabetes mellitus), type 2, uncontrolled ([place]) 05/22/2008  . Morbid obesity ([place]) 05/22/2008  . 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 DEPRESSION 05/22/2008  . Obstructive sleep apnea 05/22/2008  . Essential hypertension 05/22/2008  . ASTHMA 05/22/2008  . GERD 05/22/2008    Past Surgical History:  Procedure Laterality Date  . CARPAL TUNNEL RELEASE    . CESAREAN SECTION    . KNEE ARTHROSCOPY Right 07/17/2010  . LEFT HEART CATHETERIZATION WITH CORONARY ANGIOGRAM N/A 07/27/2012   Procedure: LEFT HEART CATHETERIZATION WITH CORONARY ANGIOGRAM;  Surgeon: Sherren Mocha, MD;  Location: [facility] CATH LAB;  Service: Cardiovascular;  Laterality: N/A;  . left knee surgery  screws she thinks  . MASS EXCISION N/A 06/29/2013   Procedure:  WIDE LOCAL EXCISION OF POSTERIOR NECK ABSCESS;  Surgeon: Armando Ramirez, MD;  Location: [facility] OR;  Service: General;  Laterality: N/A;    OB History    Gravida Para Term Preterm AB Living   2 1     1 1   SAB TAB Ectopic Multiple Live Births   1               Home Medications    Prior to Admission medications   Medication Sig Start Date End Date Taking? Authorizing Provider  Albiglutide (TANZEUM) 30 MG PEN Inject 30 mg into the skin once a week. 09/27/16   Sean Ellison, MD  atorvastatin (LIPITOR) 20 MG tablet Take 1 tablet (20 mg total) by mouth daily.  03/22/16   Brittany J McIntyre, MD  Blood Glucose Monitoring Suppl (ACCU-CHEK NANO SMARTVIEW) w/Device KIT 1 Device by Does not apply route once a week. 09/27/16   Sean Ellison, MD  budesonide-formoterol (SYMBICORT) 160-4.5 MCG/ACT inhaler INHALE 2 PUFFS INTO LUNGS TWICE A DAY 06/10/16   Brittany J McIntyre, MD  carvedilol (COREG) 12.5 MG tablet TAKE 1 TABLET BY MOUTH TWICE DAILY WITH A MEAL Patient taking differently: TAKE 12.5 MG BY MOUTH TWICE DAILY WITH A MEAL 08/12/15   Brittany J McIntyre, MD  cetirizine (ZYRTEC) 10 MG tablet TAKE 1 TABLET BY MOUTH DAILY Patient taking differently: TAKE 10 MG BY MOUTH DAILY AS NEEDED FOR ALLERGIES 07/16/15   Brittany J McIntyre, MD  cyclobenzaprine (FLEXERIL) 10 MG tablet Take 1 tablet (10 mg total) by mouth daily as needed for muscle spasms. 09/22/16   Brittany J McIntyre, MD  diclofenac (VOLTAREN) 75 MG EC tablet Take 1 tablet (75 mg total) by mouth 2 (two) times daily. 10/02/16    J , MD  fluticasone (FLONASE) 50 MCG/ACT nasal spray Place 2 sprays into both nostrils daily. 05/21/14   Erin J Honig, MD  gabapentin (NEURONTIN) 600 MG tablet Take 2 tablets (1,200 mg total) by mouth 3 (three) times daily. 07/12/16   Brittany J McIntyre, MD  glucose blood (ACCU-CHEK SMARTVIEW) test strip USE TO TEST BLOOD SUGAR FOUR TIMES DAILY, and lancets 4/day 09/27/16   Sean Ellison, MD  HYDROcodone-acetaminophen (NORCO) 10-325 MG tablet Take 1 tablet by mouth every 8 (eight) hours as needed for severe pain. 09/21/16   Brittany J McIntyre, MD  Insulin Glargine (LANTUS SOLOSTAR) 100 UNIT/ML Solostar Pen Inject 700 Units into the skin every morning. And pen needles 4/day 09/27/16   Sean Ellison, MD  lisinopril (PRINIVIL,ZESTRIL) 10 MG tablet Take 1 tablet (10 mg total) by mouth daily. 03/12/14   Brittany J McIntyre, MD  nitroGLYCERIN (NITROSTAT) 0.4 MG SL tablet DISSOLVE 1 TABLET UNDER THE TONGUE EVERY 5 MINUTES AS NEEDED FOR CHEST PAIN. 10/21/15   Brittany J McIntyre, MD    pantoprazole (PROTONIX) 40 MG tablet TAKE 1 TABLET BY MOUTH EVERY DAY Patient taking differently: TAKE 40 MG BY MOUTH EVERY DAY 09/15/15   Brittany J McIntyre, MD  pramipexole (MIRAPEX) 0.125 MG tablet TAKE 3 TABLETS BY MOUTH EVERY NIGHT AT BEDTIME Patient taking differently: TAKE 0.375 MG BY MOUTH EVERY NIGHT AT BEDTIME 11/13/15   Brittany J McIntyre, MD  PROVENTIL HFA 108 (90 BASE) MCG/ACT inhaler INHALE 1 PUFF BY MOUTH EVERY 6 HOURS AS NEEDED FOR WHEEZING OR SHORTNESS OF BREATH. 07/16/15   Brittany J McIntyre, MD  ranitidine (ZANTAC) 150 MG tablet Take 1 tablet (150 mg total) by   mouth 2 (two) times daily. 03/15/16 03/15/17  Brittany J McIntyre, MD  topiramate (TOPAMAX) 25 MG tablet TAKE 1 TABLET BY MOUTH EVERY NIGHT AT BEDTIME Patient taking differently: TAKE 25 MG BY MOUTH EVERY NIGHT AT BEDTIME 09/15/15   Brittany J McIntyre, MD    Family History Family History  Problem Relation Age of Onset  . Diabetes Mother   . Hyperlipidemia Mother   . Depression Mother   . Varicose Veins Mother   . Heart attack Paternal Uncle   . Heart disease Paternal Grandmother   . Heart attack Paternal Grandmother   . Heart attack Paternal Grandfather   . Heart disease Paternal Grandfather   . Heart attack Father     Social History Social History  Substance Use Topics  . Smoking status: Former Smoker    Packs/day: 0.30    Years: 0.30    Types: Cigarettes    Quit date: 12/06/1993  . Smokeless tobacco: Never Used  . Alcohol use No     Allergies   Kiwi extract and Nubain [nalbuphine hcl]   Review of Systems Review of Systems  Musculoskeletal: Positive for arthralgias.  All other systems reviewed and are negative.    Physical Exam Updated Vital Signs BP 131/80 (BP Location: Right Arm)   Pulse 106   Temp 97.8 F (36.6 C) (Oral)   Resp 17   LMP 09/09/2016   SpO2 98%   Physical Exam  Constitutional: She is oriented to person, place, and time. She appears well-developed and  well-nourished. No distress.  HENT:  Head: Normocephalic and atraumatic.  Right Ear: Hearing normal.  Left Ear: Hearing normal.  Nose: Nose normal.  Mouth/Throat: Oropharynx is clear and moist and mucous membranes are normal.  Eyes: Conjunctivae and EOM are normal. Pupils are equal, round, and reactive to light.  Neck: Normal range of motion. Neck supple.  Cardiovascular: Regular rhythm, S1 normal and S2 normal.  Exam reveals no gallop and no friction rub.   No murmur heard. Pulmonary/Chest: Effort normal and breath sounds normal. No respiratory distress. She exhibits no tenderness.  Abdominal: Soft. Normal appearance and bowel sounds are normal. There is no hepatosplenomegaly. There is no tenderness. There is no rebound, no guarding, no tenderness at McBurney's point and negative Murphy's sign. No hernia.  Musculoskeletal: Normal range of motion.       Right knee: She exhibits normal range of motion, no swelling, no effusion, no deformity and no erythema. Tenderness found.  Neurological: She is alert and oriented to person, place, and time. She has normal strength. No cranial nerve deficit or sensory deficit. Coordination normal. GCS eye subscore is 4. GCS verbal subscore is 5. GCS motor subscore is 6.  Skin: Skin is warm, dry and intact. No rash noted. No cyanosis.  Psychiatric: She has a normal mood and affect. Her speech is normal and behavior is normal. Thought content normal.  Nursing note and vitals reviewed.    ED Treatments / Results  Labs (all labs ordered are listed, but only abnormal results are displayed) Labs Reviewed - No data to display  EKG  EKG Interpretation None       Radiology No results found.  Procedures Procedures (including critical care time)  Medications Ordered in ED Medications  oxyCODONE-acetaminophen (PERCOCET/ROXICET) 5-325 MG per tablet 1 tablet (not administered)     Initial Impression / Assessment and Plan / ED Course  I have reviewed  the triage vital signs and the nursing notes.  Pertinent labs & imaging   results that were available during my care of the patient were reviewed by me and considered in my medical decision making (see chart for details).  Clinical Course   Examination is unremarkable. She reports severe pain and tenderness with palpation, however, examination is otherwise unremarkable. No joint effusion appreciated. There is no redness or warmth. Patient does have full range of motion. No concern for joint infection. Pain is been chronically present for more than a month. Patient informed that she would not the prescribed narcotic analgesia for chronic pain, needs to follow-up with her orthopedist and have the MRI performed.  Final Clinical Impressions(s) / ED Diagnoses   Final diagnoses:  Chronic pain of right knee    New Prescriptions New Prescriptions   DICLOFENAC (VOLTAREN) 75 MG EC TABLET    Take 1 tablet (75 mg total) by mouth 2 (two) times daily.      J , MD 10/02/16 0638  

## 2016-10-11 ENCOUNTER — Ambulatory Visit (INDEPENDENT_AMBULATORY_CARE_PROVIDER_SITE_OTHER): Payer: Medicaid Other | Admitting: Orthopaedic Surgery

## 2016-10-15 ENCOUNTER — Ambulatory Visit
Admission: RE | Admit: 2016-10-15 | Discharge: 2016-10-15 | Disposition: A | Discharge status: Home / Self Care | Payer: Medicaid Other | Source: Ambulatory Visit | Attending: Orthopaedic Surgery | Admitting: Orthopaedic Surgery

## 2016-10-15 DIAGNOSIS — M25561 Pain in right knee: Secondary | ICD-10-CM

## 2016-10-19 ENCOUNTER — Other Ambulatory Visit: Payer: Self-pay | Admitting: *Deleted

## 2016-10-22 ENCOUNTER — Other Ambulatory Visit: Payer: Self-pay | Admitting: *Deleted

## 2016-10-22 ENCOUNTER — Ambulatory Visit (INDEPENDENT_AMBULATORY_CARE_PROVIDER_SITE_OTHER): Payer: Medicaid Other | Admitting: Orthopaedic Surgery

## 2016-10-22 DIAGNOSIS — M109 Gout, unspecified: Secondary | ICD-10-CM | POA: Insufficient documentation

## 2016-10-22 DIAGNOSIS — M1711 Unilateral primary osteoarthritis, right knee: Secondary | ICD-10-CM | POA: Diagnosis not present

## 2016-10-22 NOTE — Progress Notes (Addendum)
Office Visit Note   Patient: [name]           Date of Birth: 10/15/1976           MRN: [id number] Visit Date: 10/22/2016              Requested by: [name], MD [address] [place], [place] [address] PCP: [name], MD   Assessment & Plan: Visit Diagnoses:  1. Unilateral primary osteoarthritis, right knee     Plan: Patient is morbidly obese with early degenerative joint disease and medial and lateral meniscal tears found on MRI. She has uncontrolled DM.  She will call once her a1c is within acceptable limits.  F/u prn  Follow-Up Instructions: No Follow-up on file.   Orders:  No orders of the defined types were placed in this encounter.  No orders of the defined types were placed in this encounter.     Procedures: No procedures performed   Clinical Data: No additional findings.   Subjective: No chief complaint on file.   Patient is following up for her right knee MRI. She states that she continues to have significant pain in her knee. The injection helped very little. The knee continues to pop    Review of Systems  Constitutional: Negative.   HENT: Negative.   Eyes: Negative.   Respiratory: Negative.   Cardiovascular: Negative.   Endocrine: Negative.   Musculoskeletal: Negative.   Neurological: Negative.   Hematological: Negative.   Psychiatric/Behavioral: Negative.   All other systems reviewed and are negative.    Objective: Vital Signs: There were no vitals taken for this visit.  Physical Exam  Constitutional: She is oriented to person, place, and time. She appears well-developed and well-nourished.  HENT:  Head: Atraumatic.  Eyes: EOM are normal.  Neck: Neck supple.  Cardiovascular: Intact distal pulses.   Pulmonary/Chest: Effort normal.  Abdominal: Soft.  Neurological: She is alert and oriented to person, place, and time.  Skin: Skin is warm. Capillary refill takes less than 2 seconds.  Psychiatric: She has a  normal mood and affect. Her behavior is normal. Judgment and thought content normal.  Nursing note and vitals reviewed.   Ortho Exam Examining the right knee is stable. Specialty Comments:  No specialty comments available.  Imaging: No results found.   PMFS History: Patient Active Problem List   Diagnosis Date Noted  . Unilateral primary osteoarthritis, right knee 10/22/2016  . Diabetic neuropathy ([place]) 07/14/2016  . De Quervain's tenosynovitis, bilateral 11/01/2015  . Vitamin D deficiency 09/05/2015  . Recurrent candidiasis of vagina 09/05/2015  . Varicose veins of leg with complications [id number]  . Neuropathy, intercostal nerve 12/17/2014  . Rectal itching 11/17/2014  . Restless leg syndrome 10/17/2014  . Chronic sinusitis 07/18/2014  . Headache 07/15/2014  . Vision, loss, sudden 07/12/2014  . Urge incontinence 10/15/2013  . Encounter for chronic pain management 06/30/2013  . Low back pain 01/31/2013  . HLD (hyperlipidemia) 11/19/2012  . Chest pain 06/27/2012  . Right carpal tunnel syndrome 09/01/2011  . Bilateral knee pain 09/01/2011  . DM (diabetes mellitus), type 2, uncontrolled ([place]) 05/22/2008  . Morbid obesity ([name]) 05/22/2008  . 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 DEPRESSION 05/22/2008  . Obstructive sleep apnea 05/22/2008  . Essential hypertension 05/22/2008  . ASTHMA 05/22/2008  . GERD 05/22/2008   Past Medical History:  Diagnosis Date  . Alveolar hypoventilation   . Anemia    not on iron pill  . Arthritis   .  Asthma   . Bipolar 2 disorder ([place])   . Carpal tunnel syndrome on right    recurrent  . Cellulitis 08/2010-08/2011  . Chronic pain   . COPD (chronic obstructive pulmonary disease) ([name])    Symbicort daily and Proventil as needed  . Costochondritis   . Depression   . Diabetes mellitus 2000   Type 2, Uncontrolled.Takes Lantus daily.Fasting blood sugar runs 150  . Dizziness    occasionally  . Drug-seeking behavior   . GERD  (gastroesophageal reflux disease)    takes Pantoprazole and Zantac daily  . Headache    migraine-last one about a yr ago.Topamax daily  . HLD (hyperlipidemia)    takes Atorvastatin daily  . Hypertension    takes Lisinopril and Coreg daily  . Morbid obesity ([place])   . Muscle spasm    takes Flexeril as needed  . Nocturia   . Obstructive sleep apnea   . Peripheral neuropathy ([name])    takes Gabapentin daily  . Pneumonia    "walking" several yrs ago and as a baby  . Rectal fissure   . Restless leg   . Urinary frequency   . Varicose veins    Right medial thigh and Left leg     Family History  Problem Relation Age of Onset  . Diabetes Mother   . Hyperlipidemia Mother   . Depression Mother   . Varicose Veins Mother   . Heart attack Paternal Uncle   . Heart disease Paternal Grandmother   . Heart attack Paternal Grandmother   . Heart attack Paternal Grandfather   . Heart disease Paternal Grandfather   . Heart attack Father     Past Surgical History:  Procedure Laterality Date  . CARPAL TUNNEL RELEASE    . CESAREAN SECTION    . KNEE ARTHROSCOPY Right 07/17/2010  . LEFT HEART CATHETERIZATION WITH CORONARY ANGIOGRAM N/A 07/27/2012   Procedure: LEFT HEART CATHETERIZATION WITH CORONARY ANGIOGRAM;  Surgeon: Sherren Mocha, MD;  Location: [facility] CATH LAB;  Service: Cardiovascular;  Laterality: N/A;  . left knee surgery     screws she thinks  . MASS EXCISION N/A 06/29/2013   Procedure:  WIDE LOCAL EXCISION OF POSTERIOR NECK ABSCESS;  Surgeon: Ralene Ok, MD;  Location: [place];  Service: General;  Laterality: N/A;   Social History   Occupational History  . Not on file.   Social History Main Topics  . Smoking status: Former Smoker    Packs/day: 0.30    Years: 0.30    Types: Cigarettes    Quit date: 12/06/1993  . Smokeless tobacco: Never Used  . Alcohol use No  . Drug use: No  . Sexual activity: Yes    Partners: Male

## 2016-10-25 MED ORDER — LISINOPRIL 10 MG PO TABS: 10.0000 mg | ORAL_TABLET | Freq: Every day | ORAL | 5 refills | Status: DC

## 2016-10-25 MED ORDER — CARVEDILOL 12.5 MG PO TABS: ORAL_TABLET | ORAL | 5 refills | Status: DC

## 2016-10-25 MED ORDER — TOPIRAMATE 25 MG PO TABS: 25.0000 mg | ORAL_TABLET | Freq: Every day | ORAL | 3 refills | Status: DC

## 2016-10-29 ENCOUNTER — Inpatient Hospital Stay (HOSPITAL_COMMUNITY)
Admission: EM | Admit: 2016-10-29 | Discharge: 2016-11-03 | DRG: 917 | Disposition: A | Discharge status: Other Acute Inpatient Hospital | Payer: Medicaid Other | Attending: Internal Medicine | Admitting: Internal Medicine

## 2016-10-29 ENCOUNTER — Encounter (HOSPITAL_COMMUNITY): Payer: Self-pay | Admitting: Emergency Medicine

## 2016-10-29 DIAGNOSIS — E119 Type 2 diabetes mellitus without complications: Secondary | ICD-10-CM | POA: Diagnosis present

## 2016-10-29 DIAGNOSIS — Z915 Personal history of self-harm: Secondary | ICD-10-CM

## 2016-10-29 DIAGNOSIS — E1149 Type 2 diabetes mellitus with other diabetic neurological complication: Secondary | ICD-10-CM | POA: Diagnosis present

## 2016-10-29 DIAGNOSIS — G4733 Obstructive sleep apnea (adult) (pediatric): Secondary | ICD-10-CM | POA: Diagnosis present

## 2016-10-29 DIAGNOSIS — E785 Hyperlipidemia, unspecified: Secondary | ICD-10-CM | POA: Diagnosis present

## 2016-10-29 DIAGNOSIS — R000 Tachycardia, unspecified: Secondary | ICD-10-CM | POA: Diagnosis present

## 2016-10-29 DIAGNOSIS — E662 Morbid (severe) obesity with alveolar hypoventilation: Secondary | ICD-10-CM | POA: Diagnosis present

## 2016-10-29 DIAGNOSIS — G92 Toxic encephalopathy: Secondary | ICD-10-CM | POA: Diagnosis present

## 2016-10-29 DIAGNOSIS — Z794 Long term (current) use of insulin: Secondary | ICD-10-CM

## 2016-10-29 DIAGNOSIS — F329 Major depressive disorder, single episode, unspecified: Secondary | ICD-10-CM

## 2016-10-29 DIAGNOSIS — Z833 Family history of diabetes mellitus: Secondary | ICD-10-CM

## 2016-10-29 DIAGNOSIS — T50902A Poisoning by unspecified drugs, medicaments and biological substances, intentional self-harm, initial encounter: Principal | ICD-10-CM | POA: Diagnosis present

## 2016-10-29 DIAGNOSIS — G2581 Restless legs syndrome: Secondary | ICD-10-CM | POA: Diagnosis present

## 2016-10-29 DIAGNOSIS — Z6841 Body Mass Index (BMI) 40.0 and over, adult: Secondary | ICD-10-CM

## 2016-10-29 DIAGNOSIS — K219 Gastro-esophageal reflux disease without esophagitis: Secondary | ICD-10-CM | POA: Diagnosis present

## 2016-10-29 DIAGNOSIS — R45851 Suicidal ideations: Secondary | ICD-10-CM

## 2016-10-29 DIAGNOSIS — E1151 Type 2 diabetes mellitus with diabetic peripheral angiopathy without gangrene: Secondary | ICD-10-CM | POA: Diagnosis present

## 2016-10-29 DIAGNOSIS — Z91018 Allergy to other foods: Secondary | ICD-10-CM

## 2016-10-29 DIAGNOSIS — E876 Hypokalemia: Secondary | ICD-10-CM | POA: Diagnosis not present

## 2016-10-29 DIAGNOSIS — E1142 Type 2 diabetes mellitus with diabetic polyneuropathy: Secondary | ICD-10-CM | POA: Diagnosis present

## 2016-10-29 DIAGNOSIS — Z9114 Patient's other noncompliance with medication regimen: Secondary | ICD-10-CM

## 2016-10-29 DIAGNOSIS — F32A Depression, unspecified: Secondary | ICD-10-CM

## 2016-10-29 DIAGNOSIS — Z9119 Patient's noncompliance with other medical treatment and regimen: Secondary | ICD-10-CM

## 2016-10-29 DIAGNOSIS — R69 Illness, unspecified: Secondary | ICD-10-CM

## 2016-10-29 DIAGNOSIS — Z7951 Long term (current) use of inhaled steroids: Secondary | ICD-10-CM

## 2016-10-29 DIAGNOSIS — IMO0002 Reserved for concepts with insufficient information to code with codable children: Secondary | ICD-10-CM | POA: Diagnosis present

## 2016-10-29 DIAGNOSIS — IMO0001 Reserved for inherently not codable concepts without codable children: Secondary | ICD-10-CM

## 2016-10-29 DIAGNOSIS — T50901A Poisoning by unspecified drugs, medicaments and biological substances, accidental (unintentional), initial encounter: Secondary | ICD-10-CM | POA: Diagnosis present

## 2016-10-29 DIAGNOSIS — Z885 Allergy status to narcotic agent status: Secondary | ICD-10-CM

## 2016-10-29 DIAGNOSIS — E1165 Type 2 diabetes mellitus with hyperglycemia: Secondary | ICD-10-CM | POA: Diagnosis present

## 2016-10-29 DIAGNOSIS — M1711 Unilateral primary osteoarthritis, right knee: Secondary | ICD-10-CM | POA: Diagnosis present

## 2016-10-29 DIAGNOSIS — Z818 Family history of other mental and behavioral disorders: Secondary | ICD-10-CM

## 2016-10-29 DIAGNOSIS — I10 Essential (primary) hypertension: Secondary | ICD-10-CM | POA: Diagnosis present

## 2016-10-29 DIAGNOSIS — Z87891 Personal history of nicotine dependence: Secondary | ICD-10-CM

## 2016-10-29 DIAGNOSIS — F3181 Bipolar II disorder: Secondary | ICD-10-CM | POA: Diagnosis present

## 2016-10-29 DIAGNOSIS — G8929 Other chronic pain: Secondary | ICD-10-CM | POA: Diagnosis present

## 2016-10-29 DIAGNOSIS — E1140 Type 2 diabetes mellitus with diabetic neuropathy, unspecified: Secondary | ICD-10-CM | POA: Diagnosis present

## 2016-10-29 DIAGNOSIS — F14129 Cocaine abuse with intoxication, unspecified: Secondary | ICD-10-CM | POA: Diagnosis present

## 2016-10-29 DIAGNOSIS — I4581 Long QT syndrome: Secondary | ICD-10-CM | POA: Diagnosis present

## 2016-10-29 DIAGNOSIS — J449 Chronic obstructive pulmonary disease, unspecified: Secondary | ICD-10-CM | POA: Diagnosis present

## 2016-10-29 DIAGNOSIS — Z8249 Family history of ischemic heart disease and other diseases of the circulatory system: Secondary | ICD-10-CM

## 2016-10-29 LAB — RAPID URINE DRUG SCREEN, HOSP PERFORMED
AMPHETAMINES: NOT DETECTED
Barbiturates: NOT DETECTED
Benzodiazepines: NOT DETECTED
COCAINE: POSITIVE — AB
OPIATES: NOT DETECTED
Tetrahydrocannabinol: NOT DETECTED

## 2016-10-29 LAB — COMPREHENSIVE METABOLIC PANEL
ALT: 19 U/L (ref 14–54)
ANION GAP: 13 (ref 5–15)
AST: 18 U/L (ref 15–41)
Albumin: 3.6 g/dL (ref 3.5–5.0)
Alkaline Phosphatase: 96 U/L (ref 38–126)
BUN: 14 mg/dL (ref 6–20)
CHLORIDE: 99 mmol/L — AB (ref 101–111)
CO2: 22 mmol/L (ref 22–32)
CREATININE: 0.56 mg/dL (ref 0.44–1.00)
Calcium: 8.9 mg/dL (ref 8.9–10.3)
Glucose, Bld: 383 mg/dL — ABNORMAL HIGH (ref 65–99)
POTASSIUM: 3.5 mmol/L (ref 3.5–5.1)
SODIUM: 134 mmol/L — AB (ref 135–145)
Total Bilirubin: 0.9 mg/dL (ref 0.3–1.2)
Total Protein: 7.9 g/dL (ref 6.5–8.1)

## 2016-10-29 LAB — CBG MONITORING, ED: Glucose-Capillary: 379 mg/dL — ABNORMAL HIGH (ref 65–99)

## 2016-10-29 LAB — CBC
HCT: 41.6 % (ref 36.0–46.0)
Hemoglobin: 14.5 g/dL (ref 12.0–15.0)
MCH: 30.7 pg (ref 26.0–34.0)
MCHC: 34.9 g/dL (ref 30.0–36.0)
MCV: 87.9 fL (ref 78.0–100.0)
PLATELETS: 349 10*3/uL (ref 150–400)
RBC: 4.73 MIL/uL (ref 3.87–5.11)
RDW: 12.7 % (ref 11.5–15.5)
WBC: 10.6 10*3/uL — ABNORMAL HIGH (ref 4.0–10.5)

## 2016-10-29 LAB — I-STAT TROPONIN, ED: TROPONIN I, POC: 0 ng/mL (ref 0.00–0.08)

## 2016-10-29 LAB — ACETAMINOPHEN LEVEL

## 2016-10-29 LAB — ETHANOL

## 2016-10-29 LAB — PREGNANCY, URINE: Preg Test, Ur: NEGATIVE

## 2016-10-29 LAB — I-STAT BETA HCG BLOOD, ED (MC, WL, AP ONLY)

## 2016-10-29 LAB — SALICYLATE LEVEL

## 2016-10-29 MED ORDER — SODIUM CHLORIDE 0.9 % IV BOLUS (SEPSIS)
1000.0000 mL | Freq: Once | INTRAVENOUS | Status: AC
  Administered 2016-10-29: 1000 mL via INTRAVENOUS

## 2016-10-29 NOTE — ED Notes (Signed)
Bed: RESA Expected date:  Expected time:  Means of arrival:  Comments: 40 yo F/? OD

## 2016-10-29 NOTE — ED Provider Notes (Signed)
[facility] DEPT Provider Note   CSN: [id number] Arrival date & time: 10/29/16  2036     History   Chief Complaint Chief Complaint  Patient presents with  . Drug Overdose  . Suicidal    HPI [name] is a 40 y.o. female.  HPI Patient has had multiple text messages to her mother today stating she wanted to hurt herself. EMS was contacted to go to her home. Patient was found lying on her floor, incoherent. Patient had assumed overdose but unknown what medications. Bottles found at the scene were gabapentin, cyclobenzaprine, nitroglycerin and Ultram. Reportedly the bottles were not empty. Vital signs on EMS arrival were blood pressure 144/104, heart rate 145 and respiratory rate is 16. BG of 405 and oxygen saturation 94%. Past Medical History:  Diagnosis Date  . Alveolar hypoventilation   . Anemia    not on iron pill  . Arthritis   . Asthma   . Bipolar 2 disorder ([place])   . Carpal tunnel syndrome on right    recurrent  . Cellulitis 08/2010-08/2011  . Chronic pain   . COPD (chronic obstructive pulmonary disease) ([name])    Symbicort daily and Proventil as needed  . Costochondritis   . Depression   . Diabetes mellitus 2000   Type 2, Uncontrolled.Takes Lantus daily.Fasting blood sugar runs 150  . Dizziness    occasionally  . Drug-seeking behavior   . GERD (gastroesophageal reflux disease)    takes Pantoprazole and Zantac daily  . Headache    migraine-last one about a yr ago.Topamax daily  . HLD (hyperlipidemia)    takes Atorvastatin daily  . Hypertension    takes Lisinopril and Coreg daily  . Morbid obesity ([place])   . Muscle spasm    takes Flexeril as needed  . Nocturia   . Obstructive sleep apnea   . Peripheral neuropathy ([name])    takes Gabapentin daily  . Pneumonia    "walking" several yrs ago and as a baby  . Rectal fissure   . Restless leg   . Urinary frequency   . Varicose veins    Right medial thigh and Left leg     Patient Active Problem List   Diagnosis Date Noted  . Polysubstance overdose 10/29/2016  . Unilateral primary osteoarthritis, right knee 10/22/2016  . Diabetic neuropathy ([place]) 07/14/2016  . De Quervain's tenosynovitis, bilateral 11/01/2015  . Vitamin D deficiency 09/05/2015  . Recurrent candidiasis of vagina 09/05/2015  . Varicose veins of leg with complications 86/76/1950  . Neuropathy, intercostal nerve 12/17/2014  . Rectal itching 11/17/2014  . Restless leg syndrome 10/17/2014  . Chronic sinusitis 07/18/2014  . Headache 07/15/2014  . Vision, loss, sudden 07/12/2014  . Urge incontinence 10/15/2013  . Encounter for chronic pain management 06/30/2013  . Low back pain 01/31/2013  . HLD (hyperlipidemia) 11/19/2012  . Chest pain 06/27/2012  . Right carpal tunnel syndrome 09/01/2011  . Bilateral knee pain 09/01/2011  . DM (diabetes mellitus), type 2, uncontrolled ([place]) 05/22/2008  . Morbid obesity ([place]) 05/22/2008  . 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 DEPRESSION 05/22/2008  . Obstructive sleep apnea 05/22/2008  . Essential hypertension 05/22/2008  . ASTHMA 05/22/2008  . GERD 05/22/2008    Past Surgical History:  Procedure Laterality Date  . CARPAL TUNNEL RELEASE    . CESAREAN SECTION    . KNEE ARTHROSCOPY Right 07/17/2010  . LEFT HEART CATHETERIZATION WITH CORONARY ANGIOGRAM N/A 07/27/2012   Procedure: LEFT HEART CATHETERIZATION WITH CORONARY ANGIOGRAM;  Surgeon: Sherren Mocha,  MD;  Location: [facility] CATH LAB;  Service: Cardiovascular;  Laterality: N/A;  . left knee surgery     screws she thinks  . MASS EXCISION N/A 06/29/2013   Procedure:  WIDE LOCAL EXCISION OF POSTERIOR NECK ABSCESS;  Surgeon: Ralene Ok, MD;  Location: [facility];  Service: General;  Laterality: N/A;    OB History    Gravida Para Term Preterm AB Living   '2 1     1 1   ' SAB TAB Ectopic Multiple Live Births   1               Home Medications    Prior to Admission medications   Medication Sig Start Date End Date Taking?  Authorizing Provider  Albiglutide (TANZEUM) 30 MG PEN Inject 30 mg into the skin once a week. 09/27/16   [name], MD  atorvastatin (LIPITOR) 20 MG tablet Take 1 tablet (20 mg total) by mouth daily. 03/22/16   [name], MD  Blood Glucose Monitoring Suppl (ACCU-CHEK NANO SMARTVIEW) w/Device KIT 1 Device by Does not apply route once a week. 09/27/16   [name], MD  budesonide-formoterol (SYMBICORT) 160-4.5 MCG/ACT inhaler INHALE 2 PUFFS INTO LUNGS TWICE A DAY 06/10/16   [name], MD  carvedilol (COREG) 12.5 MG tablet TAKE 1 TABLET BY MOUTH TWICE DAILY WITH A MEAL 10/25/16   [name], MD  cetirizine (ZYRTEC) 10 MG tablet TAKE 1 TABLET BY MOUTH DAILY Patient taking differently: TAKE 10 MG BY MOUTH DAILY AS NEEDED FOR ALLERGIES 07/16/15   [name], MD  cyclobenzaprine (FLEXERIL) 10 MG tablet Take 1 tablet (10 mg total) by mouth daily as needed for muscle spasms. 09/22/16   [name], MD  diclofenac (VOLTAREN) 75 MG EC tablet Take 1 tablet (75 mg total) by mouth 2 (two) times daily. 10/02/16   Orpah Greek, MD  fluticasone (FLONASE) 50 MCG/ACT nasal spray Place 2 sprays into both nostrils daily. 05/21/14   Melony Overly, MD  gabapentin (NEURONTIN) 600 MG tablet Take 2 tablets (1,200 mg total) by mouth 3 (three) times daily. 07/12/16   [name], MD  glucose blood (ACCU-CHEK SMARTVIEW) test strip USE TO TEST BLOOD SUGAR FOUR TIMES DAILY, and lancets 4/day 09/27/16   [name], MD  HYDROcodone-acetaminophen (NORCO) 10-325 MG tablet Take 1 tablet by mouth every 8 (eight) hours as needed for severe pain. 09/21/16   [name], MD  Insulin Glargine (LANTUS SOLOSTAR) 100 UNIT/ML Solostar Pen Inject 700 Units into the skin every morning. And pen needles 4/day 09/27/16   [name], MD  lisinopril (PRINIVIL,ZESTRIL) 10 MG tablet Take 1 tablet (10 mg total) by mouth daily. 10/25/16   [name], MD  nitroGLYCERIN  (NITROSTAT) 0.4 MG SL tablet DISSOLVE 1 TABLET UNDER THE TONGUE EVERY 5 MINUTES AS NEEDED FOR CHEST PAIN. 10/21/15   [name], MD  pantoprazole (PROTONIX) 40 MG tablet TAKE 1 TABLET BY MOUTH EVERY DAY Patient taking differently: TAKE 40 MG BY MOUTH EVERY DAY 09/15/15   [name], MD  pramipexole (MIRAPEX) 0.125 MG tablet TAKE 3 TABLETS BY MOUTH EVERY NIGHT AT BEDTIME Patient taking differently: TAKE 0.375 MG BY MOUTH EVERY NIGHT AT BEDTIME 11/13/15   [name], MD  PROVENTIL HFA 108 (90 BASE) MCG/ACT inhaler INHALE 1 PUFF BY MOUTH EVERY 6 HOURS AS NEEDED FOR WHEEZING OR SHORTNESS OF BREATH. 07/16/15   [name], MD  ranitidine (ZANTAC) 150 MG tablet  Take 1 tablet (150 mg total) by mouth 2 (two) times daily. 03/15/16 03/15/17  [name], MD  topiramate (TOPAMAX) 25 MG tablet Take 1 tablet (25 mg total) by mouth at bedtime. 10/25/16   [name], MD    Family History Family History  Problem Relation Age of Onset  . Diabetes Mother   . Hyperlipidemia Mother   . Depression Mother   . Varicose Veins Mother   . Heart attack Paternal Uncle   . Heart disease Paternal Grandmother   . Heart attack Paternal Grandmother   . Heart attack Paternal Grandfather   . Heart disease Paternal Grandfather   . Heart attack Father     Social History Social History  Substance Use Topics  . Smoking status: Former Smoker    Packs/day: 0.30    Years: 0.30    Types: Cigarettes    Quit date: 12/06/1993  . Smokeless tobacco: Never Used  . Alcohol use No     Allergies   Kiwi extract and Nubain [nalbuphine hcl]   Review of Systems Review of Systems Cannot obtain due to patient condition.  Physical Exam Updated Vital Signs BP (!) 142/106 (BP Location: Right Wrist)   Pulse 120   Resp 22   SpO2 95%   Physical Exam  Constitutional:  Patient is somnolent but agitated. She sleeps for a few seconds and awakens again and starts moving her blankets  about. Color is good. Patient is morbidly obese.  HENT:  Head: Normocephalic and atraumatic.  Mouth/Throat: Oropharynx is clear and moist.  No pooling of secretions or drooling.  Eyes:  Pupils are mid range and responsive to light. Bilateral sclerae are mildly injected.  Neck: Neck supple.  Cardiovascular:  Tachycardic. No gross rub murmur gallop.  Pulmonary/Chest: Effort normal and breath sounds normal. No respiratory distress.  Abdominal:  Patient has significantly obese abdomen. No apparent guarding.  Musculoskeletal:  Patient using all 4 extremities in symmetric fashion to shift her self about in the stretcher. No evidence of focal neurologic deficit. No evidence of deformity. Patient does have various bruising on her lower legs.  Neurological:  Patient is confused and somnolent but agitated. Moving all 4 extremities purposely.  Skin: Skin is warm and dry.     ED Treatments / Results  Labs (all labs ordered are listed, but only abnormal results are displayed) Labs Reviewed  COMPREHENSIVE METABOLIC PANEL - Abnormal; Notable for the following:       Result Value   Sodium 134 (*)    Chloride 99 (*)    Glucose, Bld 383 (*)    All other components within normal limits  ACETAMINOPHEN LEVEL - Abnormal; Notable for the following:    Acetaminophen (Tylenol), Serum <10 (*)    All other components within normal limits  CBC - Abnormal; Notable for the following:    WBC 10.6 (*)    All other components within normal limits  RAPID URINE DRUG SCREEN, HOSP PERFORMED - Abnormal; Notable for the following:    Cocaine POSITIVE (*)    All other components within normal limits  CBG MONITORING, ED - Abnormal; Notable for the following:    Glucose-Capillary 379 (*)    All other components within normal limits  ETHANOL  SALICYLATE LEVEL  PREGNANCY, URINE  I-STAT BETA HCG BLOOD, ED ([facility], [facility], [facility] ONLY)  I-STAT TROPOININ, ED  I-STAT CG4 LACTIC ACID, ED    EKG  EKG Interpretation None        Radiology No  results found.  Procedures Procedures (including critical care time)  CRITICAL CARE Performed by: Charlesetta Shanks   Total critical care time: 45 minutes  Critical care time was exclusive of separately billable procedures and treating other patients.  Critical care was necessary to treat or prevent imminent or life-threatening deterioration.  Critical care was time spent personally by me on the following activities: development of treatment plan with patient and/or surrogate as well as nursing, discussions with consultants, evaluation of patient's response to treatment, examination of patient, obtaining history from patient or surrogate, ordering and performing treatments and interventions, ordering and review of laboratory studies, ordering and review of radiographic studies, pulse oximetry and re-evaluation of patient's condition. Medications Ordered in ED Medications  sodium chloride 0.9 % bolus 1,000 mL (1,000 mLs Intravenous New Bag/Given 10/29/16 2139)     Initial Impression / Assessment and Plan / ED Course  I have reviewed the triage vital signs and the nursing notes.  Pertinent labs & imaging results that were available during my care of the patient were reviewed by me and considered in my medical decision making (see chart for details).  Clinical Course    Consult: Dr. Donette Larry intensivist. Can admit hospitalist service and will be available for consultation if needed. Consult: Dr. Eulas Post, [facility] hospitalist for admission. Case reviewed.  Final Clinical Impressions(s) / ED Diagnoses   Final diagnoses:  Polysubstance overdose, intentional self-harm, initial encounter ([place])  Severe comorbid illness   Patient's condition in emergent department has not shown signs of deterioration. She is intermittently agitated and somnolent. Patient however has purposeful movements and now is having oriented comments. She still remains somnolent and with obesity  hypoventilation syndrome has increased risk for respiratory distress or apnea. It is unknown at this time how much medication or what she took. Patient will remain under close observation for evolving mental status and respiratory status with polysubstance overdose. New Prescriptions New Prescriptions   No medications on file     Charlesetta Shanks, MD 10/29/16 2344

## 2016-10-29 NOTE — ED Triage Notes (Signed)
History of multiple items,  Mother contact mironda aiello  [phone] Started sending mother her texts 5pm, 10/29/16 about want to hurt herself.   police report

## 2016-10-29 NOTE — ED Triage Notes (Signed)
Pt comes to ed , via ems, OD, unknown drugs, pt texted her mother she wants to die, ems arrived, found her laying on floor. Not coherent, arousal able to vocie and touch. Pt found at 19:15. V/s are 144/104, hr 145, rr16,  cbg 405,sp02 94.  Bottles found were gabapentin, cyclobenzaprine, nitro, and ultram. Bottles appeared not be empty so unknown amount and drug.   18 in LFA, no narcan given, pupils 4 equal reactive.  Home address;  [place], [place] , [address]. Family otw

## 2016-10-30 ENCOUNTER — Encounter (HOSPITAL_COMMUNITY): Payer: Self-pay

## 2016-10-30 ENCOUNTER — Observation Stay (HOSPITAL_COMMUNITY): Payer: Medicaid Other

## 2016-10-30 DIAGNOSIS — E118 Type 2 diabetes mellitus with unspecified complications: Secondary | ICD-10-CM

## 2016-10-30 DIAGNOSIS — G92 Toxic encephalopathy: Secondary | ICD-10-CM | POA: Diagnosis not present

## 2016-10-30 DIAGNOSIS — T1491XA Suicide attempt, initial encounter: Secondary | ICD-10-CM | POA: Diagnosis not present

## 2016-10-30 DIAGNOSIS — Z6841 Body Mass Index (BMI) 40.0 and over, adult: Secondary | ICD-10-CM | POA: Diagnosis not present

## 2016-10-30 DIAGNOSIS — E119 Type 2 diabetes mellitus without complications: Secondary | ICD-10-CM | POA: Diagnosis not present

## 2016-10-30 DIAGNOSIS — G2581 Restless legs syndrome: Secondary | ICD-10-CM | POA: Diagnosis present

## 2016-10-30 DIAGNOSIS — R69 Illness, unspecified: Secondary | ICD-10-CM | POA: Diagnosis not present

## 2016-10-30 DIAGNOSIS — T50902A Poisoning by unspecified drugs, medicaments and biological substances, intentional self-harm, initial encounter: Secondary | ICD-10-CM | POA: Diagnosis not present

## 2016-10-30 DIAGNOSIS — R45851 Suicidal ideations: Secondary | ICD-10-CM | POA: Diagnosis not present

## 2016-10-30 DIAGNOSIS — E1142 Type 2 diabetes mellitus with diabetic polyneuropathy: Secondary | ICD-10-CM | POA: Diagnosis not present

## 2016-10-30 DIAGNOSIS — J449 Chronic obstructive pulmonary disease, unspecified: Secondary | ICD-10-CM | POA: Diagnosis not present

## 2016-10-30 DIAGNOSIS — G8929 Other chronic pain: Secondary | ICD-10-CM | POA: Diagnosis present

## 2016-10-30 DIAGNOSIS — F4323 Adjustment disorder with mixed anxiety and depressed mood: Secondary | ICD-10-CM | POA: Diagnosis not present

## 2016-10-30 DIAGNOSIS — Z833 Family history of diabetes mellitus: Secondary | ICD-10-CM | POA: Diagnosis not present

## 2016-10-30 DIAGNOSIS — F339 Major depressive disorder, recurrent, unspecified: Secondary | ICD-10-CM | POA: Diagnosis not present

## 2016-10-30 DIAGNOSIS — E662 Morbid (severe) obesity with alveolar hypoventilation: Secondary | ICD-10-CM | POA: Diagnosis not present

## 2016-10-30 DIAGNOSIS — F332 Major depressive disorder, recurrent severe without psychotic features: Secondary | ICD-10-CM | POA: Diagnosis not present

## 2016-10-30 DIAGNOSIS — T50901A Poisoning by unspecified drugs, medicaments and biological substances, accidental (unintentional), initial encounter: Secondary | ICD-10-CM | POA: Diagnosis present

## 2016-10-30 DIAGNOSIS — Z915 Personal history of self-harm: Secondary | ICD-10-CM | POA: Diagnosis not present

## 2016-10-30 DIAGNOSIS — Z9114 Patient's other noncompliance with medication regimen: Secondary | ICD-10-CM | POA: Diagnosis not present

## 2016-10-30 DIAGNOSIS — E1165 Type 2 diabetes mellitus with hyperglycemia: Secondary | ICD-10-CM

## 2016-10-30 DIAGNOSIS — E785 Hyperlipidemia, unspecified: Secondary | ICD-10-CM | POA: Diagnosis not present

## 2016-10-30 DIAGNOSIS — F3181 Bipolar II disorder: Secondary | ICD-10-CM | POA: Diagnosis not present

## 2016-10-30 DIAGNOSIS — Z7951 Long term (current) use of inhaled steroids: Secondary | ICD-10-CM | POA: Diagnosis not present

## 2016-10-30 DIAGNOSIS — Z794 Long term (current) use of insulin: Secondary | ICD-10-CM | POA: Diagnosis not present

## 2016-10-30 DIAGNOSIS — F14129 Cocaine abuse with intoxication, unspecified: Secondary | ICD-10-CM | POA: Diagnosis present

## 2016-10-30 DIAGNOSIS — R000 Tachycardia, unspecified: Secondary | ICD-10-CM

## 2016-10-30 DIAGNOSIS — I10 Essential (primary) hypertension: Secondary | ICD-10-CM

## 2016-10-30 DIAGNOSIS — Z818 Family history of other mental and behavioral disorders: Secondary | ICD-10-CM | POA: Diagnosis not present

## 2016-10-30 DIAGNOSIS — Z9889 Other specified postprocedural states: Secondary | ICD-10-CM | POA: Diagnosis not present

## 2016-10-30 DIAGNOSIS — I4581 Long QT syndrome: Secondary | ICD-10-CM | POA: Diagnosis present

## 2016-10-30 DIAGNOSIS — G4733 Obstructive sleep apnea (adult) (pediatric): Secondary | ICD-10-CM | POA: Diagnosis not present

## 2016-10-30 DIAGNOSIS — E876 Hypokalemia: Secondary | ICD-10-CM | POA: Diagnosis not present

## 2016-10-30 DIAGNOSIS — K219 Gastro-esophageal reflux disease without esophagitis: Secondary | ICD-10-CM | POA: Diagnosis not present

## 2016-10-30 LAB — COMPREHENSIVE METABOLIC PANEL
ALBUMIN: 2.9 g/dL — AB (ref 3.5–5.0)
ALK PHOS: 81 U/L (ref 38–126)
ALT: 19 U/L (ref 14–54)
AST: 15 U/L (ref 15–41)
Anion gap: 7 (ref 5–15)
BILIRUBIN TOTAL: 1 mg/dL (ref 0.3–1.2)
BUN: 12 mg/dL (ref 6–20)
CO2: 25 mmol/L (ref 22–32)
CREATININE: 0.51 mg/dL (ref 0.44–1.00)
Calcium: 8.4 mg/dL — ABNORMAL LOW (ref 8.9–10.3)
Chloride: 104 mmol/L (ref 101–111)
GFR calc Af Amer: >60 mL/min (ref >60)
GFR calc non Af Amer: >60 mL/min (ref >60)
GLUCOSE: 306 mg/dL — AB (ref 65–99)
POTASSIUM: 3.8 mmol/L (ref 3.5–5.1)
Sodium: 136 mmol/L (ref 135–145)
TOTAL PROTEIN: 6.6 g/dL (ref 6.5–8.1)

## 2016-10-30 LAB — CBC
HEMATOCRIT: 39.2 % (ref 36.0–46.0)
HEMOGLOBIN: 13.3 g/dL (ref 12.0–15.0)
MCH: 30 pg (ref 26.0–34.0)
MCHC: 33.9 g/dL (ref 30.0–36.0)
MCV: 88.3 fL (ref 78.0–100.0)
Platelets: 298 10*3/uL (ref 150–400)
RBC: 4.44 MIL/uL (ref 3.87–5.11)
RDW: 12.7 % (ref 11.5–15.5)
WBC: 10.7 10*3/uL — AB (ref 4.0–10.5)

## 2016-10-30 LAB — PROTIME-INR
INR: 0.97
Prothrombin Time: 12.9 seconds (ref 11.4–15.2)

## 2016-10-30 LAB — GLUCOSE, CAPILLARY
GLUCOSE-CAPILLARY: 319 mg/dL — AB (ref 65–99)
GLUCOSE-CAPILLARY: 157 mg/dL — AB (ref 65–99)
GLUCOSE-CAPILLARY: 217 mg/dL — AB (ref 65–99)
GLUCOSE-CAPILLARY: 257 mg/dL — AB (ref 65–99)
Glucose-Capillary: 203 mg/dL — ABNORMAL HIGH (ref 65–99)
Glucose-Capillary: 183 mg/dL — ABNORMAL HIGH (ref 65–99)
Glucose-Capillary: 257 mg/dL — ABNORMAL HIGH (ref 65–99)

## 2016-10-30 LAB — TROPONIN I: Troponin I: <0.03 ng/mL (ref <0.03)

## 2016-10-30 LAB — MRSA PCR SCREENING: MRSA by PCR: NEGATIVE

## 2016-10-30 MED ORDER — SODIUM CHLORIDE 0.9% FLUSH
3.0000 mL | Freq: Two times a day (BID) | INTRAVENOUS | Status: DC
  Administered 2016-10-30 – 2016-10-31 (×4): 3 mL via INTRAVENOUS

## 2016-10-30 MED ORDER — ACETAMINOPHEN 325 MG PO TABS
650.0000 mg | ORAL_TABLET | Freq: Four times a day (QID) | ORAL | Status: DC | PRN
  Administered 2016-10-30 – 2016-11-02 (×2): 650 mg via ORAL
  Filled 2016-10-30 (×2): qty 2

## 2016-10-30 MED ORDER — CARVEDILOL 3.125 MG PO TABS
3.1250 mg | ORAL_TABLET | Freq: Two times a day (BID) | ORAL | Status: DC
  Administered 2016-10-30 – 2016-11-03 (×8): 3.125 mg via ORAL
  Filled 2016-10-30 (×8): qty 1

## 2016-10-30 MED ORDER — INSULIN ASPART 100 UNIT/ML ~~LOC~~ SOLN
0.0000 [IU] | SUBCUTANEOUS | Status: DC
  Administered 2016-10-30: 3 [IU] via SUBCUTANEOUS
  Administered 2016-10-30: 8 [IU] via SUBCUTANEOUS
  Administered 2016-10-30: 11 [IU] via SUBCUTANEOUS
  Administered 2016-10-30: 5 [IU] via SUBCUTANEOUS
  Administered 2016-10-30: 3 [IU] via SUBCUTANEOUS
  Administered 2016-10-30 – 2016-10-31 (×3): 5 [IU] via SUBCUTANEOUS
  Administered 2016-10-31 – 2016-11-01 (×5): 3 [IU] via SUBCUTANEOUS
  Administered 2016-11-01 (×2): 5 [IU] via SUBCUTANEOUS
  Administered 2016-11-01: 3 [IU] via SUBCUTANEOUS

## 2016-10-30 MED ORDER — FAMOTIDINE IN NACL 20-0.9 MG/50ML-% IV SOLN
20.0000 mg | Freq: Two times a day (BID) | INTRAVENOUS | Status: DC
  Administered 2016-10-30 – 2016-10-31 (×4): 20 mg via INTRAVENOUS
  Filled 2016-10-30 (×5): qty 50

## 2016-10-30 MED ORDER — ATORVASTATIN CALCIUM 20 MG PO TABS
20.0000 mg | ORAL_TABLET | Freq: Every day | ORAL | Status: DC
  Administered 2016-10-30 – 2016-11-03 (×5): 20 mg via ORAL
  Filled 2016-10-30: qty 1
  Filled 2016-10-30: qty 2
  Filled 2016-10-30 (×3): qty 1

## 2016-10-30 MED ORDER — INSULIN DETEMIR 100 UNIT/ML ~~LOC~~ SOLN
15.0000 [IU] | Freq: Every day | SUBCUTANEOUS | Status: DC
  Administered 2016-10-30 – 2016-11-02 (×5): 15 [IU] via SUBCUTANEOUS
  Filled 2016-10-30 (×6): qty 0.15

## 2016-10-30 MED ORDER — LISINOPRIL 10 MG PO TABS
5.0000 mg | ORAL_TABLET | Freq: Every day | ORAL | Status: DC
  Administered 2016-10-31 – 2016-11-03 (×4): 5 mg via ORAL
  Filled 2016-10-30 (×4): qty 1

## 2016-10-30 MED ORDER — SODIUM CHLORIDE 0.9 % IV SOLN
INTRAVENOUS | Status: DC
  Administered 2016-10-30 – 2016-11-01 (×5): via INTRAVENOUS

## 2016-10-30 MED ORDER — ACETAMINOPHEN 650 MG RE SUPP: 650.0000 mg | Freq: Four times a day (QID) | RECTAL | Status: DC | PRN

## 2016-10-30 MED ORDER — METOCLOPRAMIDE HCL 5 MG/ML IJ SOLN: 5.0000 mg | Freq: Four times a day (QID) | INTRAMUSCULAR | Status: DC | PRN

## 2016-10-30 NOTE — H&P (Signed)
History and Physical    [name] [id number] DOB: 1976/03/06 DOA: 10/29/2016  PCP: [name], MD   Patient coming from: Home  Chief Complaint: Brought in by EMS after unwitnessed drug overdose.  HPI: Placida Cambre is a 40 y.o. woman with a history of morbid obesity, HTN, HLD, uncontrolled Type 2 DM, COPD (she is not on home oxygen), OSA (she wears CPAP at night), GERD, and depression who was found down in her home after apparent polysubstance overdose.  The patient is still quite somnolent and unable to give any meaningful history.  HPI taken from ED documentation and her mother.  The patient contacted her mother by text message this afternoon to tell her that she had active suicidal ideations with plans to hurt herself.  The patient's mother went to her home and found her down.  She called 911.  Bottles of flexeril, nitroglycerin, neurontin, and tramadol were around the patient.  It is unclear how many she took of each.  The patient's mother denies any apparent seizure activity.  No bowel or bladder incontinence.  She has been arousable to voice but unable to stay awake or engage in meaningful interaction.  ED Course: The patient received a 1L NS bolus in the ED.  She has a sinus tachycardia that improved somewhat after IV fluids.  She is protecting her airway and O2 sats have remained greater than 96% on RA in the ED.  BP has been mildly elevated but stable.  EKG shows a prolonged QTc interval.  Blood sugar elevated to 383 but she is not acidotic.  She has a mild leukocytosis.  UDS positive for cocaine but not opiates.  Narcan has not been given.  Hospitalist asked to place in observation.  Her mother reports that she is under the care of a psychologist but does not know what medication regimen she is supposed to be on.  She has a history of SI and prior suicide attempts in the past.  Review of Systems: Unable to obtain due to patient's altered mental status.   Past Medical History:    Diagnosis Date  . Alveolar hypoventilation   . Anemia    not on iron pill  . Arthritis   . Asthma   . Bipolar 2 disorder ([place])   . Carpal tunnel syndrome on right    recurrent  . Cellulitis 08/2010-08/2011  . Chronic pain   . COPD (chronic obstructive pulmonary disease) ([name])    Symbicort daily and Proventil as needed  . Costochondritis   . Depression   . Diabetes mellitus 2000   Type 2, Uncontrolled.Takes Lantus daily.Fasting blood sugar runs 150  . Dizziness    occasionally  . Drug-seeking behavior   . GERD (gastroesophageal reflux disease)    takes Pantoprazole and Zantac daily  . Headache    migraine-last one about a yr ago.Topamax daily  . HLD (hyperlipidemia)    takes Atorvastatin daily  . Hypertension    takes Lisinopril and Coreg daily  . Morbid obesity ([place])   . Muscle spasm    takes Flexeril as needed  . Nocturia   . Obstructive sleep apnea   . Peripheral neuropathy ([name])    takes Gabapentin daily  . Pneumonia    "walking" several yrs ago and as a baby  . Rectal fissure   . Restless leg   . Urinary frequency   . Varicose veins    Right medial thigh and Left leg     Past Surgical History:  Procedure Laterality Date  . CARPAL TUNNEL RELEASE    . CESAREAN SECTION    . KNEE ARTHROSCOPY Right 07/17/2010  . LEFT HEART CATHETERIZATION WITH CORONARY ANGIOGRAM N/A 07/27/2012   Procedure: LEFT HEART CATHETERIZATION WITH CORONARY ANGIOGRAM;  Surgeon: Sherren Mocha, MD;  Location: [facility] CATH LAB;  Service: Cardiovascular;  Laterality: N/A;  . left knee surgery     screws she thinks  . MASS EXCISION N/A 06/29/2013   Procedure:  WIDE LOCAL EXCISION OF POSTERIOR NECK ABSCESS;  Surgeon: Ralene Ok, MD;  Location: [facility];  Service: General;  Laterality: N/A;     reports that she quit smoking about 22 years ago. Her smoking use included Cigarettes. She has a 0.09 pack-year smoking history. She has never used smokeless tobacco. She reports that she uses drugs. She  reports that she does not drink alcohol.  Allergies  Allergen Reactions  . Kiwi Extract Shortness Of Breath  . Nubain [Nalbuphine Hcl] Other (See Comments)    "FEELS LIKE SOMETHING CRAWLING ON ME"    Family History  Problem Relation Age of Onset  . Diabetes Mother   . Hyperlipidemia Mother   . Depression Mother   . Varicose Veins Mother   . Heart attack Paternal Uncle   . Heart disease Paternal Grandmother   . Heart attack Paternal Grandmother   . Heart attack Paternal Grandfather   . Heart disease Paternal Grandfather   . Heart attack Father      Prior to Admission medications   Medication Sig Start Date End Date Taking? Authorizing Provider  Albiglutide (TANZEUM) 30 MG PEN Inject 30 mg into the skin once a week. 09/27/16   [name], MD  atorvastatin (LIPITOR) 20 MG tablet Take 1 tablet (20 mg total) by mouth daily. 03/22/16   [name], MD  Blood Glucose Monitoring Suppl (ACCU-CHEK NANO SMARTVIEW) w/Device KIT 1 Device by Does not apply route once a week. 09/27/16   [name], MD  budesonide-formoterol (SYMBICORT) 160-4.5 MCG/ACT inhaler INHALE 2 PUFFS INTO LUNGS TWICE A DAY 06/10/16   [name], MD  carvedilol (COREG) 12.5 MG tablet TAKE 1 TABLET BY MOUTH TWICE DAILY WITH A MEAL 10/25/16   [name], MD  cetirizine (ZYRTEC) 10 MG tablet TAKE 1 TABLET BY MOUTH DAILY Patient taking differently: TAKE 10 MG BY MOUTH DAILY AS NEEDED FOR ALLERGIES 07/16/15   [name], MD  cyclobenzaprine (FLEXERIL) 10 MG tablet Take 1 tablet (10 mg total) by mouth daily as needed for muscle spasms. 09/22/16   [name], MD  diclofenac (VOLTAREN) 75 MG EC tablet Take 1 tablet (75 mg total) by mouth 2 (two) times daily. 10/02/16   Orpah Greek, MD  fluticasone (FLONASE) 50 MCG/ACT nasal spray Place 2 sprays into both nostrils daily. 05/21/14   Melony Overly, MD  gabapentin (NEURONTIN) 600 MG tablet Take 2 tablets (1,200 mg total) by mouth 3  (three) times daily. 07/12/16   [name], MD  glucose blood (ACCU-CHEK SMARTVIEW) test strip USE TO TEST BLOOD SUGAR FOUR TIMES DAILY, and lancets 4/day 09/27/16   [name], MD  HYDROcodone-acetaminophen (NORCO) 10-325 MG tablet Take 1 tablet by mouth every 8 (eight) hours as needed for severe pain. 09/21/16   [name], MD  Insulin Glargine (LANTUS SOLOSTAR) 100 UNIT/ML Solostar Pen Inject 700 Units into the skin every morning. And pen needles 4/day 09/27/16   [name], MD  lisinopril (PRINIVIL,ZESTRIL) 10 MG tablet Take 1 tablet (  10 mg total) by mouth daily. 10/25/16   [name], MD  nitroGLYCERIN (NITROSTAT) 0.4 MG SL tablet DISSOLVE 1 TABLET UNDER THE TONGUE EVERY 5 MINUTES AS NEEDED FOR CHEST PAIN. 10/21/15   [name], MD  pantoprazole (PROTONIX) 40 MG tablet TAKE 1 TABLET BY MOUTH EVERY DAY Patient taking differently: TAKE 40 MG BY MOUTH EVERY DAY 09/15/15   [name], MD  pramipexole (MIRAPEX) 0.125 MG tablet TAKE 3 TABLETS BY MOUTH EVERY NIGHT AT BEDTIME Patient taking differently: TAKE 0.375 MG BY MOUTH EVERY NIGHT AT BEDTIME 11/13/15   [name], MD  PROVENTIL HFA 108 (90 BASE) MCG/ACT inhaler INHALE 1 PUFF BY MOUTH EVERY 6 HOURS AS NEEDED FOR WHEEZING OR SHORTNESS OF BREATH. 07/16/15   [name], MD  ranitidine (ZANTAC) 150 MG tablet Take 1 tablet (150 mg total) by mouth 2 (two) times daily. 03/15/16 03/15/17  [name], MD  topiramate (TOPAMAX) 25 MG tablet Take 1 tablet (25 mg total) by mouth at bedtime. 10/25/16   [name], MD    Physical Exam: Vitals:   10/29/16 2231 10/29/16 2300 10/29/16 2307 10/29/16 2357  BP: 137/91 (!) 142/106 (!) 142/106 124/72  Pulse: (!) 124  120 115  Resp: _0 SpO2: 95%  95% 95%      Constitutional: NAD, sleeping, arouses to voice but cannot stay awake Vitals:   10/29/16 2231 10/29/16 2300 10/29/16 2307 10/29/16 2357  BP: 137/91 (!) 142/106 (!)  142/106 124/72  Pulse: (!) 124  120 115  Resp: _1 SpO2: 95%  95% 95%   Eyes: PERRL, lids and conjunctivae normal ENMT: Mucous membranes are dry.  Unable to see posterior pharynx.    Neck: normal appearance, thick and supple Respiratory: clear to auscultation listening anteriorly.  Normal respiratory effort. No accessory muscle use.  Cardiovascular: Tachycardic but regular.  Heart sounds are distant.  No extremity edema. 2+ pedal pulses. GI: abdomen is obese but soft and compressible.  No distention.  No tenderness.  No masses palpated.  Bowel sounds are present. Musculoskeletal:  Moves all four extremities spontaneously.  No contractures. Normal muscle tone.  Skin: no rashes, warm and dry Neurologic: Compromised by mental status changes. Psychiatric: Lethargic.  Disoriented.  Impaired judgment.    Labs on Admission: I have personally reviewed following labs and imaging studies  CBC:  Recent Labs Lab 10/29/16 2100  WBC 10.6*  HGB 14.5  HCT 41.6  MCV 87.9  PLT 633   Basic Metabolic Panel:  Recent Labs Lab 10/29/16 2100  NA 134*  K 3.5  CL 99*  CO2 22  GLUCOSE 383*  BUN 14  CREATININE 0.56  CALCIUM 8.9   GFR: CrCl cannot be calculated (Unknown ideal weight.). Liver Function Tests:  Recent Labs Lab 10/29/16 2100  AST 18  ALT 19  ALKPHOS 96  BILITOT 0.9  PROT 7.9  ALBUMIN 3.6   CBG:  Recent Labs Lab 10/29/16 2108  GLUCAP 379*   Urine analysis:    Component Value Date/Time   COLORURINE YELLOW 11/23/2015 1400   APPEARANCEUR CLEAR 11/23/2015 1400   LABSPEC 1.036 (H) 11/23/2015 1400   PHURINE 5.0 11/23/2015 1400   GLUCOSEU >1000 (A) 11/23/2015 1400   HGBUR NEGATIVE 11/23/2015 1400   BILIRUBINUR NEGATIVE 11/23/2015 1400   KETONESUR NEGATIVE 11/23/2015 1400   PROTEINUR NEGATIVE 11/23/2015 1400   UROBILINOGEN 0.2 08/22/2015 0225   NITRITE NEGATIVE 11/23/2015 1400   LEUKOCYTESUR NEGATIVE  11/23/2015 1400    Radiological Exams on  Admission: Chest xray pending.  EKG: Sinus tachycardia.  No acute ST segment elevations.  Prolonged QTc interval.  Assessment/Plan Principal Problem:   Polysubstance overdose Active Problems:   DM (diabetes mellitus), type 2, uncontrolled ([place])   Morbid obesity ([place])   Essential hypertension   Depression   Suicidal ideation      Toxic metabolic encephalopathy secondary to intentional polysubstance drug overdose.  UDS positive for cocaine. --Place in observation in the stepdown unit --Telemetry monitoring, continuous pulse oximetry --NPO until mental status improves --NS at 100cc/hr --CPAP qHS --Presently she is protecting her airway.  She has not been hypoxic. --Fall, aspiration, suicide, seizure precautions --Sitter per protocol  History of depression with prior suicide attempts --[facility] referral when medically cleared  Sinus tachycardia, presumed secondary to unknown ingestions --Hydrate --Check chest xray, troponin --Telemetry monitoring --Supportive care for now, holding BB at this time  DM, uncontrolled --Low dose levemir for now while NPO with sliding scale insulin coverage q4h per protocol --Monitor for hypoglyemia --No evidence of DKA at present  Essential HTN --BP suboptimal but stable for now.  Holding oral meds due to altered mental status.   DVT prophylaxis: SCDs Code Status: FULL Family Communication: Contacted patient's mother by phone at time of admission. Disposition Plan: Will ultimately need to transfer to psych. Consults called: NONE Admission status: Place in observation but she needs the stepdown unit for high risk for hemodynamic compromise, respiratory failure.   TIME SPENT: 60 minutes   Eber Jones MD [facility] Pager [phone]  If 7PM-7AM, please contact night-coverage [email or web address] Password [email or web address]  10/30/2016, 12:04 AM

## 2016-10-30 NOTE — Progress Notes (Addendum)
PROGRESS NOTE  [name] [id number] DOB: 1976-03-05 DOA: 10/29/2016 PCP: [name], MD  HPI/Recap of past 24 hours:  Awake alert, on room air this am when I went to see her Report difficulty ambulating due to chronic right knee pain  PM Addendum " RN report patient remain drowsy, will keep in stepdown over night  Second PM addendum: RN called the second time report patient is being awake and ordering dinner, will transfer to med tele  Assessment/Plan: Principal Problem:   Polysubstance overdose Active Problems:   DM (diabetes mellitus), type 2, uncontrolled ([place])   Morbid obesity ([place])   Essential hypertension   Depression   Suicidal ideation  Suicidal ideation/intentional drug overdose/toxic encephalopathy: she informed her mother about active suicidal ideations with plans to hurt her self, her mother went to her home and found her down with multiple pill bottles (felxeril, nitroglycerin, neurontin, tramadol )around the patient. UDS + cocaine She was drowsy with sinus tachycardia and tachypnea initially, but remain intact airway, no hypoxia, bp stable. She is better the next morning after being admitted SI sitter in room, psychiatry consulted.  QTc prolongation: Qtc 588 on admission, keep on tele, keep k>4, mag>2 Repeat ekg  487  Insulin dependent dm2, uncontrolled, a1c 13% She is followed by endocrinologist Dr Loanne Drilling Per outpatient endocrinologist office note, she is highly resistance to insulin, her home lantus dose is 700units qam, she is also recently started on glp1 agonist albiglutide once a week  She is currently on 15unit of levemir and ssi , will close monitor blood sugar and adjust insulin accordingly   HTN;  On coreg and lisinopril at home, titrate dose  Morbid obesity: per patient not able to get weight loss surgery due to insurance, she report trying to loose weigth  osa on cpap qhs  Chronic right knee pain/arthritis, report not able to  have surgery due to uncontrolled dm2    Code Status: full  Family Communication: patient   Disposition Plan: likely need inpatient psych placement   Consultants:  psychiatry  Procedures:  none  Antibiotics:  none   Objective: BP 123/71   Pulse 94   Temp 98.1 F (36.7 C) (Oral)   Resp 19   SpO2 94%   Intake/Output Summary (Last 24 hours) at 10/30/16 1440 Last data filed at 10/30/16 1100  Gross per 24 hour  Intake          1046.33 ml  Output                0 ml  Net          1046.33 ml   There were no vitals filed for this visit.  Exam:   General:  AAOx3, NAD  Cardiovascular: RRR  Respiratory: CTABL  Abdomen: Soft/ND/NT, positive BS  Musculoskeletal: No Edema  Neuro: AAOx3  Data Reviewed: Basic Metabolic Panel:  Recent Labs Lab 10/29/16 2100 10/30/16 0132  NA 134* 136  K 3.5 3.8  CL 99* 104  CO2 22 25  GLUCOSE 383* 306*  BUN 14 12  CREATININE 0.56 0.51  CALCIUM 8.9 8.4*   Liver Function Tests:  Recent Labs Lab 10/29/16 2100 10/30/16 0132  AST 18 15  ALT 19 19  ALKPHOS 96 81  BILITOT 0.9 1.0  PROT 7.9 6.6  ALBUMIN 3.6 2.9*   No results for input(s): LIPASE, AMYLASE in the last 168 hours. No results for input(s): AMMONIA in the last 168 hours. CBC:  Recent Labs Lab 10/29/16 2100 10/30/16  0132  WBC 10.6* 10.7*  HGB 14.5 13.3  HCT 41.6 39.2  MCV 87.9 88.3  PLT 349 298   Cardiac Enzymes:    Recent Labs Lab 10/30/16 0132 10/30/16 0730  TROPONINI <0.03 <0.03   BNP (last 3 results) No results for input(s): BNP in the last 8760 hours.  ProBNP (last 3 results) No results for input(s): PROBNP in the last 8760 hours.  CBG:  Recent Labs Lab 10/29/16 2108 10/30/16 0130 10/30/16 0355 10/30/16 0752 10/30/16 1148  GLUCAP 379* 319* 203* 157* 183*    Recent Results (from the past 240 hour(s))  MRSA PCR Screening     Status: None   Collection Time: 10/30/16  1:20 AM  Result Value Ref Range Status   MRSA by PCR  NEGATIVE NEGATIVE Final    Comment:        The GeneXpert MRSA Assay (FDA approved for NASAL specimens only), is one component of a comprehensive MRSA colonization surveillance program. It is not intended to diagnose MRSA infection nor to guide or monitor treatment for MRSA infections.      Studies: Dg Chest 1 View  Result Date: 10/30/2016 CLINICAL DATA:  Acute onset of tachycardia.  Initial encounter. EXAM: CHEST 1 VIEW COMPARISON:  Chest radiograph performed 06/26/2016 FINDINGS: The lungs are hypoexpanded. Vascular crowding and vascular congestion are noted. There is no evidence of focal opacification, pleural effusion or pneumothorax. The cardiomediastinal silhouette is mildly enlarged. No acute osseous abnormalities are seen. IMPRESSION: Lungs hypoexpanded. Vascular congestion and mild cardiomegaly. No definite focal airspace consolidation seen. Electronically Signed   By: Garald Balding M.D.   On: 10/30/2016 02:08    Scheduled Meds: . famotidine (PEPCID) IV  20 mg Intravenous Q12H  . insulin aspart  0-15 Units Subcutaneous Q4H  . insulin detemir  15 Units Subcutaneous QHS  . sodium chloride flush  3 mL Intravenous Q12H    Continuous Infusions: . sodium chloride 100 mL/hr at 10/30/16 1100     No charge note  Finola Rosal MD, PhD  [facility] Pager [phone]. If 7PM-7AM, please contact night-coverage at [email or web address], password [facility] 10/30/2016, 2:40 PM  LOS: 0 days

## 2016-10-31 DIAGNOSIS — E876 Hypokalemia: Secondary | ICD-10-CM

## 2016-10-31 LAB — GLUCOSE, CAPILLARY
GLUCOSE-CAPILLARY: 180 mg/dL — AB (ref 65–99)
GLUCOSE-CAPILLARY: 232 mg/dL — AB (ref 65–99)
Glucose-Capillary: 173 mg/dL — ABNORMAL HIGH (ref 65–99)
Glucose-Capillary: 180 mg/dL — ABNORMAL HIGH (ref 65–99)
Glucose-Capillary: 175 mg/dL — ABNORMAL HIGH (ref 65–99)
Glucose-Capillary: 210 mg/dL — ABNORMAL HIGH (ref 65–99)

## 2016-10-31 LAB — MAGNESIUM: MAGNESIUM: 1.8 mg/dL (ref 1.7–2.4)

## 2016-10-31 LAB — BASIC METABOLIC PANEL
Anion gap: 5 (ref 5–15)
BUN: 17 mg/dL (ref 6–20)
CALCIUM: 8.2 mg/dL — AB (ref 8.9–10.3)
CHLORIDE: 106 mmol/L (ref 101–111)
CO2: 28 mmol/L (ref 22–32)
CREATININE: 0.53 mg/dL (ref 0.44–1.00)
GFR calc non Af Amer: >60 mL/min (ref >60)
Glucose, Bld: 168 mg/dL — ABNORMAL HIGH (ref 65–99)
Potassium: 3.2 mmol/L — ABNORMAL LOW (ref 3.5–5.1)
SODIUM: 139 mmol/L (ref 135–145)

## 2016-10-31 MED ORDER — MAGNESIUM SULFATE IN D5W 1-5 GM/100ML-% IV SOLN
1.0000 g | Freq: Once | INTRAVENOUS | Status: AC
  Administered 2016-10-31: 1 g via INTRAVENOUS
  Filled 2016-10-31: qty 100

## 2016-10-31 MED ORDER — MAGNESIUM OXIDE 400 (241.3 MG) MG PO TABS
400.0000 mg | ORAL_TABLET | Freq: Every day | ORAL | Status: AC
  Administered 2016-11-01 – 2016-11-02 (×2): 400 mg via ORAL
  Filled 2016-10-31 (×2): qty 1

## 2016-10-31 MED ORDER — POTASSIUM CHLORIDE CRYS ER 20 MEQ PO TBCR
40.0000 meq | EXTENDED_RELEASE_TABLET | Freq: Once | ORAL | Status: AC
Start: 2016-10-31 — End: 2016-10-31
  Administered 2016-10-31: 40 meq via ORAL
  Filled 2016-10-31: qty 2

## 2016-10-31 MED ORDER — FAMOTIDINE 20 MG PO TABS
20.0000 mg | ORAL_TABLET | Freq: Two times a day (BID) | ORAL | Status: DC
  Administered 2016-10-31 – 2016-11-03 (×4): 20 mg via ORAL
  Filled 2016-10-31 (×5): qty 1

## 2016-10-31 NOTE — Progress Notes (Signed)
Key Points: Use following P&T approved IV to PO antibiotic change policy.  Description contains the criteria that are approved Note: Policy Excludes:  Esophagectomy patientsPHARMACIST - PHYSICIAN COMMUNICATION DR:   Erlinda Hong CONCERNING: IV to Oral Route Change Policy  RECOMMENDATION: This patient is receiving Pepcid by the intravenous route.  Based on criteria approved by the Pharmacy and Therapeutics Committee, the intravenous medication(s) is/are being converted to the equivalent oral dose form(s).   DESCRIPTION: These criteria include:  The patient is eating (either orally or via tube) and/or has been taking other orally administered medications for a least 24 hours  The patient has no evidence of active gastrointestinal bleeding or impaired GI absorption (gastrectomy, short bowel, patient on TNA or NPO).  If you have questions about this conversion, please contact the Pharmacy Department  []   [phone] )  [facility] []   [phone] )  [facility] []   [phone] )  [facility] []   [phone] )  [facility] [x]   [phone] )  [place], [place], [facility] 10/31/2016 1:50 PM

## 2016-10-31 NOTE — Progress Notes (Signed)
PROGRESS NOTE  [name] [id number] DOB: 03-21-1976 DOA: 10/29/2016 PCP: [name], MD  HPI/Recap of past 24 hours:  Fully awake and alert, denies pain, able to ambulate to bathroom with steady gait  She report she remember sending messages to her mother, but not remembering what had happened afterward.  She now wants to go home, states she needs to take care of her 24yr old daughter    Assessment/Plan: Principal Problem:   Polysubstance overdose Active Problems:   Insulin dependent diabetes mellitus ([place])   Morbid obesity ([place])   Essential hypertension   Depression   Suicidal ideation   Drug overdose  Suicidal ideation/intentional drug overdose/toxic encephalopathy: she informed her mother about active suicidal ideations with plans to hurt her self, her mother went to her home and found her down with multiple pill bottles (felxeril, nitroglycerin, neurontin, tramadol )around the patient. UDS + cocaine She was drowsy with sinus tachycardia and tachypnea initially, but remain intact airway, no hypoxia, bp stable. Encephalopathy has resolved SI sitter in room, psychiatry consulted.  QTc prolongation: Qtc 588 on admission, keep on tele, keep k>4, mag>2 Repeat ekg  487   Hypokalemia/hypomagnesemia: replace k and mag  Insulin dependent dm2, uncontrolled, a1c 13% She is followed by endocrinologist Dr Loanne Drilling Per outpatient endocrinologist office note, she is highly resistance to insulin, her home lantus dose is 700units qam, she is also recently started on glp1 agonist albiglutide once a week  She is currently on 15unit of levemir qhs and ssi ,  Am blood sugar 168,  Suspect diet/meds noncompliance instead of significant insulin resistance.   HTN;  On coreg and lisinopril at home,  She is on decreased dose here, bp stable, continue titrate bp meds  Morbid obesity:  Body mass index is 54.52 kg/m. per patient she is not able to get weight loss surgery due  to insurance, she report trying to loose weigth  osa on cpap qhs  Chronic right knee pain/arthritis, report not able to have surgery due to uncontrolled dm2    Code Status: full  Family Communication: patient   Disposition Plan: likely need inpatient psych placement, patient is medically cleared for psych placement as off 11/26   Consultants:  psychiatry  Procedures:  none  Antibiotics:  none   Objective: BP 121/73 (BP Location: Right Arm)   Pulse 92   Temp 98 F (36.7 C) (Oral)   Resp 18   Ht 5\' 2"  (1.575 m)   Wt 135.2 kg (298 lb 1.6 oz)   SpO2 98%   BMI 54.52 kg/m   Intake/Output Summary (Last 24 hours) at 10/31/16 1308 Last data filed at 10/31/16 1054  Gross per 24 hour  Intake             2260 ml  Output             1400 ml  Net              860 ml   Filed Weights   10/30/16 2229 10/31/16 0000  Weight: 131.1 kg (289 lb 1.6 oz) 135.2 kg (298 lb 1.6 oz)    Exam:   General:  AAOx3, NAD  Cardiovascular: RRR  Respiratory: CTABL  Abdomen: Soft/ND/NT, positive BS  Musculoskeletal: No Edema  Neuro: AAOx3  Data Reviewed: Basic Metabolic Panel:  Recent Labs Lab 10/29/16 2100 10/30/16 0132 10/31/16 0536  NA 134* 136 139  K 3.5 3.8 3.2*  CL 99* 104 106  CO2 22 25 28   GLUCOSE  383* 306* 168*  BUN 14 12 17   CREATININE 0.56 0.51 0.53  CALCIUM 8.9 8.4* 8.2*  MG  --   --  1.8   Liver Function Tests:  Recent Labs Lab 10/29/16 2100 10/30/16 0132  AST 18 15  ALT 19 19  ALKPHOS 96 81  BILITOT 0.9 1.0  PROT 7.9 6.6  ALBUMIN 3.6 2.9*   No results for input(s): LIPASE, AMYLASE in the last 168 hours. No results for input(s): AMMONIA in the last 168 hours. CBC:  Recent Labs Lab 10/29/16 2100 10/30/16 0132  WBC 10.6* 10.7*  HGB 14.5 13.3  HCT 41.6 39.2  MCV 87.9 88.3  PLT 349 298   Cardiac Enzymes:    Recent Labs Lab 10/30/16 0132 10/30/16 0730  TROPONINI <0.03 <0.03   BNP (last 3 results) No results for input(s): BNP in  the last 8760 hours.  ProBNP (last 3 results) No results for input(s): PROBNP in the last 8760 hours.  CBG:  Recent Labs Lab 10/30/16 2022 10/31/16 0003 10/31/16 0421 10/31/16 0658 10/31/16 1124  GLUCAP 257* 180* 173* 180* 175*    Recent Results (from the past 240 hour(s))  MRSA PCR Screening     Status: None   Collection Time: 10/30/16  1:20 AM  Result Value Ref Range Status   MRSA by PCR NEGATIVE NEGATIVE Final    Comment:        The GeneXpert MRSA Assay (FDA approved for NASAL specimens only), is one component of a comprehensive MRSA colonization surveillance program. It is not intended to diagnose MRSA infection nor to guide or monitor treatment for MRSA infections.      Studies: No results found.  Scheduled Meds: . atorvastatin  20 mg Oral Daily  . carvedilol  3.125 mg Oral BID WC  . famotidine (PEPCID) IV  20 mg Intravenous Q12H  . insulin aspart  0-15 Units Subcutaneous Q4H  . insulin detemir  15 Units Subcutaneous QHS  . lisinopril  5 mg Oral Daily  . [START ON 11/01/2016] magnesium oxide  400 mg Oral Daily  . magnesium sulfate 1 - 4 g bolus IVPB  1 g Intravenous Once  . potassium chloride  40 mEq Oral Once  . sodium chloride flush  3 mL Intravenous Q12H    Continuous Infusions: . sodium chloride 100 mL/hr at 10/31/16 [id number]     Time spent: 35mins  Daphnee Preiss MD, PhD  [facility] Pager [phone]. If 7PM-7AM, please contact night-coverage at [email or web address], password [facility] 10/31/2016, 1:08 PM  LOS: 1 day

## 2016-11-01 DIAGNOSIS — T1491XA Suicide attempt, initial encounter: Secondary | ICD-10-CM

## 2016-11-01 DIAGNOSIS — Z8249 Family history of ischemic heart disease and other diseases of the circulatory system: Secondary | ICD-10-CM

## 2016-11-01 DIAGNOSIS — Z9889 Other specified postprocedural states: Secondary | ICD-10-CM

## 2016-11-01 DIAGNOSIS — Z833 Family history of diabetes mellitus: Secondary | ICD-10-CM

## 2016-11-01 DIAGNOSIS — F332 Major depressive disorder, recurrent severe without psychotic features: Secondary | ICD-10-CM

## 2016-11-01 DIAGNOSIS — Z87891 Personal history of nicotine dependence: Secondary | ICD-10-CM

## 2016-11-01 DIAGNOSIS — Z79899 Other long term (current) drug therapy: Secondary | ICD-10-CM

## 2016-11-01 DIAGNOSIS — F1910 Other psychoactive substance abuse, uncomplicated: Secondary | ICD-10-CM

## 2016-11-01 DIAGNOSIS — Z8489 Family history of other specified conditions: Secondary | ICD-10-CM

## 2016-11-01 DIAGNOSIS — Z888 Allergy status to other drugs, medicaments and biological substances status: Secondary | ICD-10-CM

## 2016-11-01 DIAGNOSIS — Z91018 Allergy to other foods: Secondary | ICD-10-CM

## 2016-11-01 LAB — GLUCOSE, CAPILLARY
GLUCOSE-CAPILLARY: 182 mg/dL — AB (ref 65–99)
GLUCOSE-CAPILLARY: 197 mg/dL — AB (ref 65–99)
GLUCOSE-CAPILLARY: 220 mg/dL — AB (ref 65–99)
Glucose-Capillary: 174 mg/dL — ABNORMAL HIGH (ref 65–99)
Glucose-Capillary: 249 mg/dL — ABNORMAL HIGH (ref 65–99)
Glucose-Capillary: 192 mg/dL — ABNORMAL HIGH (ref 65–99)

## 2016-11-01 MED ORDER — INSULIN ASPART 100 UNIT/ML ~~LOC~~ SOLN
0.0000 [IU] | Freq: Three times a day (TID) | SUBCUTANEOUS | Status: DC
  Administered 2016-11-02 – 2016-11-03 (×4): 5 [IU] via SUBCUTANEOUS
  Administered 2016-11-03: 3 [IU] via SUBCUTANEOUS

## 2016-11-01 MED ORDER — MAGNESIUM SULFATE 2 GM/50ML IV SOLN
2.0000 g | Freq: Once | INTRAVENOUS | Status: AC
  Administered 2016-11-01: 2 g via INTRAVENOUS
  Filled 2016-11-01: qty 50

## 2016-11-01 MED ORDER — POTASSIUM CHLORIDE CRYS ER 20 MEQ PO TBCR
40.0000 meq | EXTENDED_RELEASE_TABLET | Freq: Two times a day (BID) | ORAL | Status: AC
  Filled 2016-11-01: qty 2

## 2016-11-01 MED ORDER — LORAZEPAM 1 MG PO TABS
1.0000 mg | ORAL_TABLET | Freq: Every evening | ORAL | Status: AC | PRN
  Administered 2016-11-02: 1 mg via ORAL
  Filled 2016-11-01: qty 1

## 2016-11-01 MED ORDER — ONDANSETRON HCL 4 MG/2ML IJ SOLN
4.0000 mg | Freq: Four times a day (QID) | INTRAMUSCULAR | Status: DC | PRN
  Administered 2016-11-01: 4 mg via INTRAVENOUS
  Filled 2016-11-01: qty 2

## 2016-11-01 NOTE — Discharge Summary (Signed)
Physician Discharge Summary  Anne-Marie Genson [id number] DOB: [birth year] DOA: 10/29/2016  PCP: [name], MD  Admit date: 10/29/2016 Discharge date: 11/01/2016  Admitted From: Home Disposition:  Behavioral health   Recommendations for Outpatient Follow-up:  1. Transfer to [facility]:No Equipment/Devices:None   Discharge Condition:Stable CODE STATUS:Full Diet recommendation: Heart Healthy  Brief/Interim Summary:  40 y.o. woman with a history of morbid obesity, HTN, HLD, uncontrolled Type 2 DM, COPD (she is not on home oxygen), OSA (she wears CPAP at night), GERD, and depression who was found down in her home after apparent polysubstance overdose.  The patient is still quite somnolent and unable to give any meaningful history.  HPI taken from ED documentation and her mother.  The patient contacted her mother by text message this afternoon to tell her that she had active suicidal ideations with plans to hurt herself.  The patient's mother went to her home and found her down  Discharge Diagnoses:  Principal Problem:   Polysubstance overdose Active Problems:   Insulin dependent diabetes mellitus ([place])   Morbid obesity ([place])   Essential hypertension   Depression   Suicidal ideation   Drug overdose  Intentional drug overdose/suicidal ideation/toxic encephalopathy: She related she took multiple medication flexeril, nitroglycerin, Neurontin and tramadol. Admitted with sitter to monitor Qtc. UDS was positive for cocaine, admitted to telemetry, with no events. Toxic encephalopathy due to medications now resolved. Psyq. was consulted and recommended inpatient psyq rehab.  Prolonged QTC: On admission 588, repeated 487.  Patient is medically stable to transfer to facility.  Hypokalemia/hypomagnesemia: Repleted, resolved.  Uncontrolled insulin-dependent diabetes mellitus with an A1c of 13: Continue current regimen follow-up with endocrinologist as an  outpatient. This likely due to noncompliance.  Morbid obesity: Counseling.  Obstructive sleep apnea: Continue C-pap at bedtime.  Discharge Instructions  Discharge Instructions    Diet - low sodium heart healthy    Complete by:  As directed    Increase activity slowly    Complete by:  As directed        Medication List    TAKE these medications   ACCU-CHEK NANO SMARTVIEW w/Device Kit 1 Device by Does not apply route once a week.   Albiglutide 30 MG Pen Commonly known as:  TANZEUM Inject 30 mg into the skin once a week.   atorvastatin 20 MG tablet Commonly known as:  LIPITOR Take 1 tablet (20 mg total) by mouth daily.   budesonide-formoterol 160-4.5 MCG/ACT inhaler Commonly known as:  SYMBICORT INHALE 2 PUFFS INTO LUNGS TWICE A DAY   carvedilol 12.5 MG tablet Commonly known as:  COREG TAKE 1 TABLET BY MOUTH TWICE DAILY WITH A MEAL   cetirizine 10 MG tablet Commonly known as:  ZYRTEC TAKE 1 TABLET BY MOUTH DAILY What changed:  See the new instructions.   cyclobenzaprine 10 MG tablet Commonly known as:  FLEXERIL Take 1 tablet (10 mg total) by mouth daily as needed for muscle spasms.   diclofenac 75 MG EC tablet Commonly known as:  VOLTAREN Take 1 tablet (75 mg total) by mouth 2 (two) times daily.   fluticasone 50 MCG/ACT nasal spray Commonly known as:  FLONASE Place 2 sprays into both nostrils daily.   gabapentin 600 MG tablet Commonly known as:  NEURONTIN Take 2 tablets (1,200 mg total) by mouth 3 (three) times daily.   glucose blood test strip Commonly known as:  ACCU-CHEK SMARTVIEW USE TO TEST BLOOD SUGAR FOUR TIMES DAILY, and lancets 4/day   HYDROcodone-acetaminophen 10-325  MG tablet Commonly known as:  NORCO Take 1 tablet by mouth every 8 (eight) hours as needed for severe pain.   Insulin Glargine 100 UNIT/ML Solostar Pen Commonly known as:  LANTUS SOLOSTAR Inject 700 Units into the skin every morning. And pen needles 4/day   lisinopril 10  MG tablet Commonly known as:  PRINIVIL,ZESTRIL Take 1 tablet (10 mg total) by mouth daily.   nitroGLYCERIN 0.4 MG SL tablet Commonly known as:  NITROSTAT DISSOLVE 1 TABLET UNDER THE TONGUE EVERY 5 MINUTES AS NEEDED FOR CHEST PAIN.   pantoprazole 40 MG tablet Commonly known as:  PROTONIX TAKE 1 TABLET BY MOUTH EVERY DAY What changed:  See the new instructions.   pramipexole 0.125 MG tablet Commonly known as:  MIRAPEX TAKE 3 TABLETS BY MOUTH EVERY NIGHT AT BEDTIME What changed:  See the new instructions.   PROVENTIL HFA 108 (90 Base) MCG/ACT inhaler Generic drug:  albuterol INHALE 1 PUFF BY MOUTH EVERY 6 HOURS AS NEEDED FOR WHEEZING OR SHORTNESS OF BREATH.   ranitidine 150 MG tablet Commonly known as:  ZANTAC Take 1 tablet (150 mg total) by mouth 2 (two) times daily.   topiramate 25 MG tablet Commonly known as:  TOPAMAX Take 1 tablet (25 mg total) by mouth at bedtime.       Allergies  Allergen Reactions  . Kiwi Extract Shortness Of Breath  . Nubain [Nalbuphine Hcl] Other (See Comments)    "FEELS LIKE SOMETHING CRAWLING ON ME"    Consultations: Psyq.  Procedures/Studies: Dg Chest 1 View  Result Date: 10/30/2016 CLINICAL DATA:  Acute onset of tachycardia.  Initial encounter. EXAM: CHEST 1 VIEW COMPARISON:  Chest radiograph performed 06/26/2016 FINDINGS: The lungs are hypoexpanded. Vascular crowding and vascular congestion are noted. There is no evidence of focal opacification, pleural effusion or pneumothorax. The cardiomediastinal silhouette is mildly enlarged. No acute osseous abnormalities are seen. IMPRESSION: Lungs hypoexpanded. Vascular congestion and mild cardiomegaly. No definite focal airspace consolidation seen. Electronically Signed   By: Roanna Raider M.D.   On: 10/30/2016 02:08   Mr Knee Right Wo Contrast  Result Date: 10/16/2016 CLINICAL DATA:  Right knee pain since 08/08/2016.  No known injury. EXAM: MRI OF THE RIGHT KNEE WITHOUT CONTRAST TECHNIQUE:  Multiplanar, multisequence MR imaging of the knee was performed. No intravenous contrast was administered. COMPARISON:  None. FINDINGS: MENISCI Medial meniscus: Radial tear of the posterior horn of the medial meniscus with peripheral meniscal extrusion. Lateral meniscus: Small undersurface tear of the posterior horn of the lateral meniscus adjacent to the meniscal root. LIGAMENTS Cruciates:  Intact ACL and PCL. Collaterals: Medial collateral ligament is intact. Lateral collateral ligament complex is intact. CARTILAGE Patellofemoral: Partial-thickness cartilage loss the medial and lateral patellar facets. Partial thickness cartilage loss of the medial trochlea. Medial: High-grade partial-thickness cartilage loss of the medial femoral condyle and medial tibial plateau. Lateral:  No chondral defect. Joint: Large joint effusion. Normal Hoffa's fat. No plical thickening. Popliteal Fossa:  Small Baker cyst.  Intact popliteus tendon. Extensor Mechanism: Intact quadriceps tendon and patellar tendon. Intact medial and lateral patellar retinaculum. Intact MPFL. Bones: No marrow signal abnormality. No acute fracture or dislocation. Other: No fluid collection or hematoma. IMPRESSION: 1. Radial tear of the posterior horn of the medial meniscus with peripheral meniscal extrusion. 2. Small undersurface tear of the posterior horn of the lateral meniscus adjacent to the meniscal root. 3. High-grade partial-thickness cartilage loss of the medial femoral condyle and medial tibial plateau. 4. Partial-thickness cartilage loss the medial and  lateral patellar facets. Partial thickness cartilage loss of the medial trochlea. 5. Large joint effusion. Electronically Signed   By: Kathreen Devoid   On: 10/16/2016 09:15       Subjective:   Discharge Exam: Vitals:   11/01/16 0431 11/01/16 1318  BP: 110/65 94/62  Pulse: 97 89  Resp: 18 18  Temp: 98.5 F (36.9 C) 98.1 F (36.7 C)   Vitals:   10/31/16 2057 10/31/16 2122 11/01/16  0431 11/01/16 1318  BP: 111/72  110/65 94/62  Pulse:  85 97 89  Resp:   18 18  Temp: 98 F (36.7 C)  98.5 F (36.9 C) 98.1 F (36.7 C)  TempSrc: Oral  Oral Oral  SpO2: 97% 97% 98% 98%  Weight:      Height:        General: Pt is alert, awake, not in acute distress Cardiovascular: RRR, S1/S2 +, no rubs, no gallops Respiratory: CTA bilaterally, no wheezing, no rhonchi Abdominal: Soft, NT, ND, bowel sounds + Extremities: no edema, no cyanosis    The results of significant diagnostics from this hospitalization (including imaging, microbiology, ancillary and laboratory) are listed below for reference.     Microbiology: Recent Results (from the past 240 hour(s))  MRSA PCR Screening     Status: None   Collection Time: 10/30/16  1:20 AM  Result Value Ref Range Status   MRSA by PCR NEGATIVE NEGATIVE Final    Comment:        The GeneXpert MRSA Assay (FDA approved for NASAL specimens only), is one component of a comprehensive MRSA colonization surveillance program. It is not intended to diagnose MRSA infection nor to guide or monitor treatment for MRSA infections.      Labs: BNP (last 3 results) No results for input(s): BNP in the last 8760 hours. Basic Metabolic Panel:  Recent Labs Lab 10/29/16 2100 10/30/16 0132 10/31/16 0536  NA 134* 136 139  K 3.5 3.8 3.2*  CL 99* 104 106  CO2 _0 GLUCOSE 383* 306* 168*  BUN _1 CREATININE 0.56 0.51 0.53  CALCIUM 8.9 8.4* 8.2*  MG  --   --  1.8   Liver Function Tests:  Recent Labs Lab 10/29/16 2100 10/30/16 0132  AST 18 15  ALT 19 19  ALKPHOS 96 81  BILITOT 0.9 1.0  PROT 7.9 6.6  ALBUMIN 3.6 2.9*   No results for input(s): LIPASE, AMYLASE in the last 168 hours. No results for input(s): AMMONIA in the last 168 hours. CBC:  Recent Labs Lab 10/29/16 2100 10/30/16 0132  WBC 10.6* 10.7*  HGB 14.5 13.3  HCT 41.6 39.2  MCV 87.9 88.3  PLT 349 298   Cardiac Enzymes:  Recent Labs Lab  10/30/16 0132 10/30/16 0730  TROPONINI <0.03 <0.03   BNP: Invalid input(s): POCBNP CBG:  Recent Labs Lab 11/01/16 0026 11/01/16 0428 11/01/16 0731 11/01/16 1118 11/01/16 1553  GLUCAP 220* 197* 182* 174* 249*   D-Dimer No results for input(s): DDIMER in the last 72 hours. Hgb A1c No results for input(s): HGBA1C in the last 72 hours. Lipid Profile No results for input(s): CHOL, HDL, LDLCALC, TRIG, CHOLHDL, LDLDIRECT in the last 72 hours. Thyroid function studies No results for input(s): TSH, T4TOTAL, T3FREE, THYROIDAB in the last 72 hours.  Invalid input(s): FREET3 Anemia work up No results for input(s): VITAMINB12, FOLATE, FERRITIN, TIBC, IRON, RETICCTPCT in the last 72 hours. Urinalysis    Component Value Date/Time   COLORURINE YELLOW 11/23/2015  [place] 11/23/2015 1400   LABSPEC 1.036 (H) 11/23/2015 1400   PHURINE 5.0 11/23/2015 1400   GLUCOSEU >1000 (A) 11/23/2015 1400   HGBUR NEGATIVE 11/23/2015 1400   BILIRUBINUR NEGATIVE 11/23/2015 1400   KETONESUR NEGATIVE 11/23/2015 1400   PROTEINUR NEGATIVE 11/23/2015 1400   UROBILINOGEN 0.2 08/22/2015 0225   NITRITE NEGATIVE 11/23/2015 1400   LEUKOCYTESUR NEGATIVE 11/23/2015 1400   Sepsis Labs Invalid input(s): PROCALCITONIN,  WBC,  LACTICIDVEN Microbiology Recent Results (from the past 240 hour(s))  MRSA PCR Screening     Status: None   Collection Time: 10/30/16  1:20 AM  Result Value Ref Range Status   MRSA by PCR NEGATIVE NEGATIVE Final    Comment:        The GeneXpert MRSA Assay (FDA approved for NASAL specimens only), is one component of a comprehensive MRSA colonization surveillance program. It is not intended to diagnose MRSA infection nor to guide or monitor treatment for MRSA infections.      Time coordinating discharge: Over 30 minutes  SIGNED:   Charlynne Cousins, MD  [facility] 11/01/2016, 4:30 PM Pager   If 7PM-7AM, please contact  night-coverage [email or web address] Password [email or web address]

## 2016-11-01 NOTE — Care Management Note (Signed)
Case Management Note  Patient Details  Name: [name] MRN: [id number] Date of Birth: 09/29/1976  Subjective/Objective: 40 y/o f admitted w/OD. On 1:1 SI, Psych to cons. Psych CSW following.                   Action/Plan:d/c IP Psych.   Expected Discharge Date:                  Expected Discharge Plan:  [facility]  In-House Referral:  Clinical Social Work  Discharge planning Services  CM Consult  Post Acute Care Choice:    Choice offered to:     DME Arranged:    DME Agency:     HH Arranged:    [place] Agency:     Status of Service:  Completed, signed off  If discussed at [employer], dates discussed:    Additional Comments:  Dessa Phi, RN 11/01/2016, 11:49 AM

## 2016-11-01 NOTE — Progress Notes (Signed)
[facility] PROGRESS NOTE    Progress Note  [name]  [id number] DOB: 31-Aug-1976 DOA: 10/29/2016 PCP: [name], MD     Brief Narrative:   [name] is an 40 y.o. female came in with intentional drug overdose.  Assessment/Plan:   Principal Problem:   Polysubstance overdose Active Problems:   Insulin dependent diabetes mellitus ([place])   Morbid obesity ([place])   Essential hypertension   Depression   Suicidal ideation   Drug overdose  Intentional drug overdose/suicidal ideation/toxic encephalopathy: She related she took multiple medication flexeril, nitroglycerin, Neurontin and tramadol. UDS was positive for cocaine, her tachycardia has resolved, vitals are stable. Her encephalopathy has resolved. Patient medically stable to be discharged. Awaiting psychiatric inputs.   Prolonged QTC: On admission 488, repeated 12/09/1985. Keep magnesium above 2 potassium above 4. Patient is medically stable to transfer to discharge home.  Hypokalemia/hypomagnesemia: Replete orally, did a mag IV.  Uncontrolled insulin-dependent diabetes mellitus with an A1c of 13: Continue current regimen follow-up with endocrinologist as an outpatient. This likely due to noncompliance.  Morbid obesity: Counseling.  Obstructive sleep apnea: Continue Z-Pak daily at bedtime.  Chronic right knee pain: Continue current regimen.  DVT prophylaxis: lovenox Family Communication:none Disposition Plan/Barrier to D/C: unable to determine Code Status:     Code Status Orders        Start     Ordered   10/30/16 0120  Full code  Continuous     10/30/16 0119    Code Status History    Date Active Date Inactive Code Status Order ID Comments User Context   07/12/2014 10:49 PM 07/14/2014  4:06 PM Full Code [id number]  Emmaline Kluver, MD Inpatient   06/27/2012  5:55 AM 06/27/2012  4:40 PM Full Code [id number]  Charna Archer, RN Inpatient        IV Access:    Peripheral  IV   Procedures and diagnostic studies:   No results found.   Medical Consultants:    None.  Anti-Infectives:   none  Subjective:    [name] no complains his great.  Objective:    Vitals:   10/31/16 1259 10/31/16 2057 10/31/16 2122 11/01/16 0431  BP: 121/73 111/72  110/65  Pulse: 92  85 97  Resp: 18   18  Temp: 98 F (36.7 C) 98 F (36.7 C)  98.5 F (36.9 C)  TempSrc: Oral Oral  Oral  SpO2: 98% 97% 97% 98%  Weight:      Height:        Intake/Output Summary (Last 24 hours) at 11/01/16 1142 Last data filed at 11/01/16 0446  Gross per 24 hour  Intake          1988.33 ml  Output              750 ml  Net          1238.33 ml   Filed Weights   10/30/16 2229 10/31/16 0000  Weight: 131.1 kg (289 lb 1.6 oz) 135.2 kg (298 lb 1.6 oz)    Exam: General exam: In no acute distress. Respiratory system: Good air movement and clear to auscultation. Cardiovascular system: S1 & S2 heard, RRR. No JVD, murmurs, rubs, gallops or clicks.  Gastrointestinal system: Abdomen is nondistended, soft and nontender.  Central nervous system: Alert and oriented. No focal neurological deficits. Extremities: No pedal edema. Skin: No rashes, lesions or ulcers Psychiatry: Judgement and insight appear normal. Mood & affect appropriate.    Data Reviewed:  Labs: Basic Metabolic Panel:  Recent Labs Lab 10/29/16 2100 10/30/16 0132 10/31/16 0536  NA 134* 136 139  K 3.5 3.8 3.2*  CL 99* 104 106  CO2 22 25 28   GLUCOSE 383* 306* 168*  BUN 14 12 17   CREATININE 0.56 0.51 0.53  CALCIUM 8.9 8.4* 8.2*  MG  --   --  1.8   GFR Estimated Creatinine Clearance: 126.6 mL/min (by C-G formula based on SCr of 0.53 mg/dL). Liver Function Tests:  Recent Labs Lab 10/29/16 2100 10/30/16 0132  AST 18 15  ALT 19 19  ALKPHOS 96 81  BILITOT 0.9 1.0  PROT 7.9 6.6  ALBUMIN 3.6 2.9*   No results for input(s): LIPASE, AMYLASE in the last 168 hours. No results for input(s): AMMONIA in  the last 168 hours. Coagulation profile  Recent Labs Lab 10/30/16 0132  INR 0.97    CBC:  Recent Labs Lab 10/29/16 2100 10/30/16 0132  WBC 10.6* 10.7*  HGB 14.5 13.3  HCT 41.6 39.2  MCV 87.9 88.3  PLT 349 298   Cardiac Enzymes:  Recent Labs Lab 10/30/16 0132 10/30/16 0730  TROPONINI <0.03 <0.03   BNP (last 3 results) No results for input(s): PROBNP in the last 8760 hours. CBG:  Recent Labs Lab 10/31/16 1939 11/01/16 0026 11/01/16 0428 11/01/16 0731 11/01/16 1118  GLUCAP 210* 220* 197* 182* 174*   D-Dimer: No results for input(s): DDIMER in the last 72 hours. Hgb A1c: No results for input(s): HGBA1C in the last 72 hours. Lipid Profile: No results for input(s): CHOL, HDL, LDLCALC, TRIG, CHOLHDL, LDLDIRECT in the last 72 hours. Thyroid function studies: No results for input(s): TSH, T4TOTAL, T3FREE, THYROIDAB in the last 72 hours.  Invalid input(s): FREET3 Anemia work up: No results for input(s): VITAMINB12, FOLATE, FERRITIN, TIBC, IRON, RETICCTPCT in the last 72 hours. Sepsis Labs:  Recent Labs Lab 10/29/16 2100 10/30/16 0132  WBC 10.6* 10.7*   Microbiology Recent Results (from the past 240 hour(s))  MRSA PCR Screening     Status: None   Collection Time: 10/30/16  1:20 AM  Result Value Ref Range Status   MRSA by PCR NEGATIVE NEGATIVE Final    Comment:        The GeneXpert MRSA Assay (FDA approved for NASAL specimens only), is one component of a comprehensive MRSA colonization surveillance program. It is not intended to diagnose MRSA infection nor to guide or monitor treatment for MRSA infections.      Medications:   . atorvastatin  20 mg Oral Daily  . carvedilol  3.125 mg Oral BID WC  . famotidine  20 mg Oral BID  . insulin aspart  0-15 Units Subcutaneous Q4H  . insulin detemir  15 Units Subcutaneous QHS  . lisinopril  5 mg Oral Daily  . magnesium oxide  400 mg Oral Daily  . sodium chloride flush  3 mL Intravenous Q12H    Continuous Infusions: . sodium chloride 100 mL/hr at 11/01/16 0453    Time spent: 15 min   LOS: 2 days   Charlynne Cousins  [facility] Pager [phone]  *Please refer to [email or web address], password [email or web address] to get updated schedule on who will round on this patient, as hospitalists switch teams weekly. If 7PM-7AM, please contact night-coverage at [email or web address], password [email or web address] for any overnight needs.  11/01/2016, 11:42 AM

## 2016-11-01 NOTE — Progress Notes (Signed)
Called for patient with loose stools x 4, requesting antidiarrheal agent. Orders reviewed, she is ordered to have Reglan prn n/v. Will d/c Reglan and give Zofran instead.   Check for C. Diff. No reported h/o chronic diarrhea. Will not treat with antidiarrheal agent at this time.  Carlyon Shadow, M.D.

## 2016-11-01 NOTE — Progress Notes (Signed)
CSW spoke with Caryl Pina with disposition who will give patients records to charge RN at [facility]. CSW will follow up.   Kingsley Spittle, LCSWA Clinical Social Worker [phone]

## 2016-11-01 NOTE — Progress Notes (Addendum)
Per [place] Breeding, this patient meets criteria for psychiatric hospitalization. The following facilities have been contacted to seek placement:   [place] [place] Worker [phone]

## 2016-11-01 NOTE — Progress Notes (Signed)
CSW received call from [facility] stating that patient would need to be tested for C-DIFF before they can give a bed offer. CSW updated RN and will update morning social worker.   Kingsley Spittle, LCSWA Clinical Social Worker [phone]

## 2016-11-01 NOTE — Progress Notes (Signed)
Pt removed her NSL, refuses restart at this time.  MD notified. Stacey Drain

## 2016-11-01 NOTE — Consult Note (Signed)
[facility] Face-to-Face Psychiatry Consult   Reason for Consult:  Intentional drug overdose and cocaine intoxication Referring Physician:  Dr. Aileen Fass Patient Identification: [name] MRN:  [id number] Principal Diagnosis: Polysubstance overdose Diagnosis:   Patient Active Problem List   Diagnosis Date Noted  . Drug overdose [T50.901A] 10/30/2016  . Polysubstance overdose [T50.901A] 10/29/2016  . Depression [F32.9] 10/29/2016  . Suicidal ideation [R45.851] 10/29/2016  . Unilateral primary osteoarthritis, right knee [M17.11] 10/22/2016  . Diabetic neuropathy ([place]) [E11.40] 07/14/2016  . De Quervain's tenosynovitis, bilateral [M65.4] 11/01/2015  . Vitamin D deficiency [E55.9] 09/05/2015  . 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 Rectal itching [L29.0] 11/17/2014  . Restless leg syndrome [G25.81] 10/17/2014  . Chronic sinusitis [J32.9] 07/18/2014  . Headache [R51] 07/15/2014  . Vision, loss, sudden [H53.139] 07/12/2014  . Urge incontinence [N39.41] 10/15/2013  . Encounter for chronic pain management [G89.29] 06/30/2013  . Low back pain [M54.5] 01/31/2013  . HLD (hyperlipidemia) [E78.5] 11/19/2012  . Chest pain [R07.9] 06/27/2012  . Right carpal tunnel syndrome [G56.01] 09/01/2011  . Bilateral knee pain [M25.561, M25.562] 09/01/2011  . Insulin dependent diabetes mellitus ([place]) [E11.9, Z79.4] 05/22/2008  . Morbid obesity ([place]) [E66.01] 05/22/2008  . OBESITY HYPOVENTILATION SYNDROME [E67.8] 05/22/2008  . DEPRESSION [F32.9] 05/22/2008  . Obstructive sleep apnea [G47.33] 05/22/2008  . Essential hypertension [I10] 05/22/2008  . ASTHMA [J45.909] 05/22/2008  . GERD [K21.9] 05/22/2008    Total Time spent with patient: 1 hour  Subjective:   [name] is a 40 y.o. female patient admitted with intentional drug overdose and substance abuse.  HPI:  [name] is a 40 y.o. Female, seen, chart reviewed for the face-to-face psychiatric consultation and evaluation of increased symptoms of depression and status post suicidal attempt by intentional drug overdose. Patient reported she has been depressed severely for the last 2 weeks and also extremely stressed about her husband of 10 years told her he is going to quit on her and left to his mother's home. Patient does not know why he told her and left her, since then she's been confused about what to do when she gets physical or emotionally become sick. Patient has a 31 years old daughter, currently patient mother has been taking care of her by taking time off from work. Patient mother told her she won't be able to help her on regular basis. Patient minimizes her current symptoms of depression and suicidal ideation and wishes to go home and take care of her and years old daughter. Patient continued to be at high risk to hurt herself and recommended to stay in hospital and probably need to go to the behavioral health Hospital when medically stable.   Medical history: 40 years old woman with a history of morbid obesity, HTN, HLD, uncontrolled Type 2 DM, COPD (she is not on home oxygen), OSA (she wears CPAP at night), GERD, and depression who was found down in her home after apparent polysubstance overdose.  The patient is still quite somnolent and unable to give any meaningful history.  HPI taken from ED documentation and her mother.  The patient contacted her mother by text message this afternoon to tell her that she had active suicidal ideations with plans to hurt herself.  The patient's mother went to her home and found her down.  She called 911.  Bottles of flexeril, nitroglycerin, neurontin, and tramadol were around the patient.  It  is unclear how many she took of each.  The patient's mother denies any apparent seizure activity.  No bowel or bladder incontinence.  She has been arousable to voice but unable to stay  awake or engage in meaningful interaction.  ED Course: The patient received a 1L NS bolus in the ED.  She has a sinus tachycardia that improved somewhat after IV fluids.  She is protecting her airway and O2 sats have remained greater than 96% on RA in the ED.  BP has been mildly elevated but stable.  EKG shows a prolonged QTc interval.  Blood sugar elevated to 383 but she is not acidotic.  She has a mild leukocytosis.  UDS positive for cocaine but not opiates.  Narcan has not been given.  Hospitalist asked to place in observation.  Her mother reports that she is under the care of a psychologist but does not know what medication regimen she is supposed to be on.  She has a history of SI and prior suicide attempts in the past.   Past Psychiatric History: Noncontributory  Risk to Self: Is patient at risk for suicide?: Yes Risk to Others:   Prior Inpatient Therapy:   Prior Outpatient Therapy:    Past Medical History:  Past Medical History:  Diagnosis Date  . Alveolar hypoventilation   . Anemia    not on iron pill  . Arthritis   . Asthma   . Bipolar 2 disorder ([place])   . Carpal tunnel syndrome on right    recurrent  . Cellulitis 08/2010-08/2011  . Chronic pain   . COPD (chronic obstructive pulmonary disease) ([name])    Symbicort daily and Proventil as needed  . Costochondritis   . Depression   . Diabetes mellitus 2000   Type 2, Uncontrolled.Takes Lantus daily.Fasting blood sugar runs 150  . Dizziness    occasionally  . Drug-seeking behavior   . GERD (gastroesophageal reflux disease)    takes Pantoprazole and Zantac daily  . Headache    migraine-last one about a yr ago.Topamax daily  . HLD (hyperlipidemia)    takes Atorvastatin daily  . Hypertension    takes Lisinopril and Coreg daily  . Morbid obesity ([place])   . Muscle spasm    takes Flexeril as needed  . Nocturia   . Obstructive sleep apnea   . Peripheral neuropathy ([name])    takes Gabapentin daily  . Pneumonia     "walking" several yrs ago and as a baby  . Rectal fissure   . Restless leg   . Urinary frequency   . Varicose veins    Right medial thigh and Left leg     Past Surgical History:  Procedure Laterality Date  . CARPAL TUNNEL RELEASE    . CESAREAN SECTION    . KNEE ARTHROSCOPY Right 07/17/2010  . LEFT HEART CATHETERIZATION WITH CORONARY ANGIOGRAM N/A 07/27/2012   Procedure: LEFT HEART CATHETERIZATION WITH CORONARY ANGIOGRAM;  Surgeon: Sherren Mocha, MD;  Location: [facility] CATH LAB;  Service: Cardiovascular;  Laterality: N/A;  . left knee surgery     screws she thinks  . MASS EXCISION N/A 06/29/2013   Procedure:  WIDE LOCAL EXCISION OF POSTERIOR NECK ABSCESS;  Surgeon: Ralene Ok, MD;  Location: [place];  Service: General;  Laterality: N/A;   Family History:  Family History  Problem Relation Age of Onset  . Diabetes Mother   . Hyperlipidemia Mother   . Depression Mother   . Varicose Veins Mother   . Heart attack  Paternal Uncle   . Heart disease Paternal Grandmother   . Heart attack Paternal Grandmother   . Heart attack Paternal Grandfather   . Heart disease Paternal Grandfather   . Heart attack Father    Family Psychiatric  History: Noncontributory Social History:  History  Alcohol Use No     History  Drug Use    Comment: OD attempts on home meds      Social History   Social History  . Marital status: Married    Spouse name: N/A  . Number of children: N/A  . Years of education: N/A   Social History Main Topics  . Smoking status: Former Smoker    Packs/day: 0.30    Years: 0.30    Types: Cigarettes    Quit date: 12/06/1993  . Smokeless tobacco: Never Used  . Alcohol use No  . Drug use:      Comment: OD attempts on home meds    . Sexual activity: Yes    Partners: Male   Other Topics Concern  . None   Social History Narrative   Lives in [place] with her fiance and 38 yr old dtr.   Additional Social History:    Allergies:   Allergies  Allergen Reactions  . Kiwi  Extract Shortness Of Breath  . Nubain [Nalbuphine Hcl] Other (See Comments)    "FEELS LIKE SOMETHING CRAWLING ON ME"    Labs:  Results for orders placed or performed during the hospital encounter of 10/29/16 (from the past 48 hour(s))  Glucose, capillary     Status: Abnormal   Collection Time: 10/30/16 11:48 AM  Result Value Ref Range   Glucose-Capillary 183 (H) 65 - 99 mg/dL  Glucose, capillary     Status: Abnormal   Collection Time: 10/30/16  4:06 PM  Result Value Ref Range   Glucose-Capillary 217 (H) 65 - 99 mg/dL   Comment 1 Notify RN    Comment 2 Document in Chart   Glucose, capillary     Status: Abnormal   Collection Time: 10/30/16  7:09 PM  Result Value Ref Range   Glucose-Capillary 257 (H) 65 - 99 mg/dL  Glucose, capillary     Status: Abnormal   Collection Time: 10/30/16  8:22 PM  Result Value Ref Range   Glucose-Capillary 257 (H) 65 - 99 mg/dL  Glucose, capillary     Status: Abnormal   Collection Time: 10/31/16 12:03 AM  Result Value Ref Range   Glucose-Capillary 180 (H) 65 - 99 mg/dL  Glucose, capillary     Status: Abnormal   Collection Time: 10/31/16  4:21 AM  Result Value Ref Range   Glucose-Capillary 173 (H) 65 - 99 mg/dL  Basic metabolic panel     Status: Abnormal   Collection Time: 10/31/16  5:36 AM  Result Value Ref Range   Sodium 139 135 - 145 mmol/L   Potassium 3.2 (L) 3.5 - 5.1 mmol/L   Chloride 106 101 - 111 mmol/L   CO2 28 22 - 32 mmol/L   Glucose, Bld 168 (H) 65 - 99 mg/dL   BUN 17 6 - 20 mg/dL   Creatinine, Ser 0.53 0.44 - 1.00 mg/dL   Calcium 8.2 (L) 8.9 - 10.3 mg/dL   GFR calc non Af Amer >60 >60 mL/min   GFR calc Af Amer >60 >60 mL/min    Comment: (NOTE) The eGFR has been calculated using the CKD EPI equation. This calculation has not been validated in all clinical situations. eGFR's persistently <60  mL/min signify possible Chronic Kidney Disease.    Anion gap 5 5 - 15  Magnesium     Status: None   Collection Time: 10/31/16  5:36 AM   Result Value Ref Range   Magnesium 1.8 1.7 - 2.4 mg/dL  Glucose, capillary     Status: Abnormal   Collection Time: 10/31/16  6:58 AM  Result Value Ref Range   Glucose-Capillary 180 (H) 65 - 99 mg/dL  Glucose, capillary     Status: Abnormal   Collection Time: 10/31/16 11:24 AM  Result Value Ref Range   Glucose-Capillary 175 (H) 65 - 99 mg/dL  Glucose, capillary     Status: Abnormal   Collection Time: 10/31/16  4:22 PM  Result Value Ref Range   Glucose-Capillary 232 (H) 65 - 99 mg/dL  Glucose, capillary     Status: Abnormal   Collection Time: 10/31/16  7:39 PM  Result Value Ref Range   Glucose-Capillary 210 (H) 65 - 99 mg/dL  Glucose, capillary     Status: Abnormal   Collection Time: 11/01/16 12:26 AM  Result Value Ref Range   Glucose-Capillary 220 (H) 65 - 99 mg/dL  Glucose, capillary     Status: Abnormal   Collection Time: 11/01/16  4:28 AM  Result Value Ref Range   Glucose-Capillary 197 (H) 65 - 99 mg/dL  Glucose, capillary     Status: Abnormal   Collection Time: 11/01/16  7:31 AM  Result Value Ref Range   Glucose-Capillary 182 (H) 65 - 99 mg/dL   Comment 1 Notify RN     Current Facility-Administered Medications  Medication Dose Route Frequency Provider Last Rate Last Dose  . 0.9 %  sodium chloride infusion   Intravenous Continuous Lily Kocher, MD 100 mL/hr at 11/01/16 0453    . acetaminophen (TYLENOL) tablet 650 mg  650 mg Oral Q6H PRN Lily Kocher, MD   650 mg at 10/30/16 0830   Or  . acetaminophen (TYLENOL) suppository 650 mg  650 mg Rectal Q6H PRN Lily Kocher, MD      . atorvastatin (LIPITOR) tablet 20 mg  20 mg Oral Daily Florencia Reasons, MD   20 mg at 11/01/16 1004  . carvedilol (COREG) tablet 3.125 mg  3.125 mg Oral BID WC Florencia Reasons, MD   3.125 mg at 11/01/16 0801  . famotidine (PEPCID) tablet 20 mg  20 mg Oral BID Baltazar Najjar [place], RPH   20 mg at 11/01/16 1004  . insulin aspart (novoLOG) injection 0-15 Units  0-15 Units Subcutaneous Q4H Lily Kocher, MD   3 Units at  11/01/16 0802  . insulin detemir (LEVEMIR) injection 15 Units  15 Units Subcutaneous QHS Lily Kocher, MD   15 Units at 10/31/16 2130  . lisinopril (PRINIVIL,ZESTRIL) tablet 5 mg  5 mg Oral Daily Florencia Reasons, MD   5 mg at 11/01/16 1003  . magnesium oxide (MAG-OX) tablet 400 mg  400 mg Oral Daily Florencia Reasons, MD   400 mg at 11/01/16 1004  . ondansetron (ZOFRAN) injection 4 mg  4 mg Intravenous Q6H PRN Karmen Bongo, MD   4 mg at 11/01/16 0201  . sodium chloride flush (NS) 0.9 % injection 3 mL  3 mL Intravenous Q12H Lily Kocher, MD   3 mL at 10/31/16 2131    Musculoskeletal: Strength & Muscle Tone: within normal limits Gait & Station: unable to stand Patient leans: N/A  Psychiatric Specialty Exam: Physical Exam as per history and physical   ROS complaining about depression and status post  suicidal attempt by intentional drug overdose and also denied cocaine abuse. No Fever-chills, No Headache, No changes with Vision or hearing, reports vertigo No problems swallowing food or Liquids, No Chest pain, Cough or Shortness of Breath, No Abdominal pain, No Nausea or Vommitting, Bowel movements are regular, No Blood in stool or Urine, No dysuria, No new skin rashes or bruises, No new joints pains-aches,  No new weakness, tingling, numbness in any extremity, No recent weight gain or loss, No polyuria, polydypsia or polyphagia,   A full 10 point Review of Systems was done, except as stated above, all other Review of Systems were negative.  Blood pressure 110/65, pulse 97, temperature 98.5 F (36.9 C), temperature source Oral, resp. rate 18, height '5\' 2"'  (1.575 m), weight 135.2 kg (298 lb 1.6 oz), SpO2 98 %.Body mass index is 54.52 kg/m.  General Appearance: Guarded  Eye Contact:  Good  Speech:  Clear and Coherent and Slow  Volume:  Decreased  Mood:  Anxious and Depressed  Affect:  Constricted and Depressed  Thought Process:  Coherent and Goal Directed  Orientation:  Full (Time, Place, and  Person)  Thought Content:  Rumination  Suicidal Thoughts:  Yes.  with intent/plan  Homicidal Thoughts:  No  Memory:  Immediate;   Good Recent;   Fair Remote;   Fair  Judgement:  Impaired  Insight:  Shallow  Psychomotor Activity:  Decreased  Concentration:  Concentration: Fair and Attention Span: Fair  Recall:  [employer] of Knowledge:  Good  Language:  Negative  Akathisia:  Negative  Handed:  Right  AIMS (if indicated):     Assets:  Communication Skills Desire for Improvement Financial Resources/Insurance Housing Leisure Time Resilience Social Support  ADL's:  Intact  Cognition:  WNL  Sleep:        Treatment Plan Summary: 41 years old female with depression and status post suicidal attempt currently denies suicidal/homicidal ideation, intention or plans. Patient has no evidence of psychosis.  Safety concerns and: Chief Technology Officer as she had a suicidal attempt with the medications Patient has sent a text message to her mother regarding her intent to end her life Patient has been stressed about relationship issues with her husband Patient meet criteria for acute psychiatric hospitalization when medically stable. Referred to the unit social service regarding appropriate psychiatric inpatient referrals  Daily contact with patient to assess and evaluate symptoms and progress in treatment and Medication management  Disposition: Recommend psychiatric Inpatient admission when medically cleared. Supportive therapy provided about ongoing stressors.  Ambrose Finland, MD 11/01/2016 10:35 AM

## 2016-11-01 NOTE — Progress Notes (Signed)
Sitter notified the RN that the patient had 4 x loose stools. RN notified the PCP on call.

## 2016-11-02 LAB — C DIFFICILE QUICK SCREEN W PCR REFLEX
C DIFFICLE (CDIFF) ANTIGEN: NEGATIVE
C Diff interpretation: NOT DETECTED
C Diff toxin: NEGATIVE

## 2016-11-02 LAB — GLUCOSE, CAPILLARY
GLUCOSE-CAPILLARY: 226 mg/dL — AB (ref 65–99)
GLUCOSE-CAPILLARY: 223 mg/dL — AB (ref 65–99)
GLUCOSE-CAPILLARY: 237 mg/dL — AB (ref 65–99)
GLUCOSE-CAPILLARY: 209 mg/dL — AB (ref 65–99)

## 2016-11-02 MED ORDER — ZOLPIDEM TARTRATE 5 MG PO TABS
5.0000 mg | ORAL_TABLET | Freq: Once | ORAL | Status: AC
  Administered 2016-11-02: 5 mg via ORAL
  Filled 2016-11-02: qty 1

## 2016-11-02 NOTE — Progress Notes (Signed)
Received call from nurse reports patient refuses to let her daughter go home with her mother who brought her to hospital for visit. CPS report has been made for safety and wellbeing of child involved. RN notified.   Kathrin Greathouse, Latanya Presser, MSW Clinical Social Worker 5E and Psychiatric Service Line [phone] 11/02/2016  4:00 PM

## 2016-11-02 NOTE — Progress Notes (Signed)
PCP on call was notified that the results for the C.Difficile toxin and antigen were Negative.

## 2016-11-02 NOTE — Discharge Summary (Addendum)
Physician Discharge Summary  [name] [id number] DOB: Mar 12, 1976 DOA: 10/29/2016  PCP: [name], MD  Admit date: 10/29/2016 Discharge date: 11/02/2016  Admitted From: Home Discharge disposition: [facility]   Recommendations for Outpatient Follow-Up:   1. Patient is being discharged [facility] for further inpatient psychiatric treatment.   Discharge Diagnosis:   Principal Problem:    Polysubstance overdose Active Problems:    Insulin dependent diabetes mellitus ([place])    Morbid obesity ([place])    Essential hypertension    Depression    Suicidal ideation    Drug overdose    Cocaine positive urine drug screen    Prolonged QTc interval    Discharge Condition: Improved.  Diet recommendation: Low sodium, heart healthy.  Carbohydrate-modified.    History of Present Illness:   40 y.o.woman with a history of morbid obesity, HTN, HLD, uncontrolled Type 2 DM, COPD (she is not on home oxygen), OSA (she wears CPAP at night), GERD, and depression who was found down in her home after apparent polysubstance overdose.  Hospital Course by Problem:   Principal Problem:   Polysubstance overdose/depression/suicidal ideation Patient is awake and alert and tells me she took muscle relaxers. Does not want to go to [facility] but understands that it is important for her to do so. Toxic encephalopathy has now resolved. Status post psychiatric evaluation with inpatient [facility] transfer recommended. The patient is medically stable for transfer. Continue Air cabin crew.  Active Problems:   Insulin dependent diabetes mellitus ([name]) Currently being managed with moderate scale SSI and Levemir 15 units daily. Resume outpatient regimen with close follow-up with endocrinologist at discharge. The patient is felt to be noncompliant with medical treatment.    Morbid obesity ([place]) Weight loss encouraged.    Essential hypertension  Continue Coreg.    Prolonged QTC: On admission 588, repeated 487.  Patient is medically stable to transfer to facility.   Medical Consultants:    Psychiatry   Discharge Exam:   Vitals:   11/02/16 1240 11/02/16 1427  BP:  133/84  Pulse: 89 92  Resp:  18  Temp:  97.7 F (36.5 C)   Vitals:   11/02/16 0654 11/02/16 1226 11/02/16 1240 11/02/16 1427  BP: 128/86 116/62  133/84  Pulse: 87  89 92  Resp: 18   18  Temp: 98.1 F (36.7 C)   97.7 F (36.5 C)  TempSrc: Axillary   Oral  SpO2: 96%   97%  Weight:      Height:        General exam: Appears calm and comfortable. Morbidly obese Respiratory system: Clear to auscultation, diminished in the bases. Respiratory effort normal. Cardiovascular system: S1 & S2 heard, RRR. No JVD,  rubs, gallops or clicks. No murmurs. Gastrointestinal system: Abdomen is nondistended, soft and nontender. No organomegaly or masses felt. Normal bowel sounds heard. Central nervous system: Alert and oriented. No focal neurological deficits. Extremities: No clubbing,  or cyanosis. Trace edema. Skin: No rashes, lesions or ulcers. Psychiatry: Judgement and insight appear impaired. Mood & affect flat/depressed.    The results of significant diagnostics from this hospitalization (including imaging, microbiology, ancillary and laboratory) are listed below for reference.     Procedures and Diagnostic Studies:   Dg Chest 1 View  Result Date: 10/30/2016 CLINICAL DATA:  Acute onset of tachycardia.  Initial encounter. EXAM: CHEST 1 VIEW COMPARISON:  Chest radiograph performed 06/26/2016 FINDINGS: The lungs are hypoexpanded. Vascular crowding and vascular congestion are noted. There is no evidence of focal opacification, pleural  effusion or pneumothorax. The cardiomediastinal silhouette is mildly enlarged. No acute osseous abnormalities are seen. IMPRESSION: Lungs hypoexpanded. Vascular congestion and mild cardiomegaly. No definite focal airspace consolidation seen. Electronically Signed   By: Garald Balding M.D.   On: 10/30/2016  02:08     Labs:   Basic Metabolic Panel:  Recent Labs Lab 10/29/16 2100 10/30/16 0132 10/31/16 0536  NA 134* 136 139  K 3.5 3.8 3.2*  CL 99* 104 106  CO2 _0 GLUCOSE 383* 306* 168*  BUN _1 CREATININE 0.56 0.51 0.53  CALCIUM 8.9 8.4* 8.2*  MG  --   --  1.8   GFR Estimated Creatinine Clearance: 126.6 mL/min (by C-G formula based on SCr of 0.53 mg/dL). Liver Function Tests:  Recent Labs Lab 10/29/16 2100 10/30/16 0132  AST 18 15  ALT 19 19  ALKPHOS 96 81  BILITOT 0.9 1.0  PROT 7.9 6.6  ALBUMIN 3.6 2.9*   No results for input(s): LIPASE, AMYLASE in the last 168 hours. No results for input(s): AMMONIA in the last 168 hours. Coagulation profile  Recent Labs Lab 10/30/16 0132  INR 0.97    CBC:  Recent Labs Lab 10/29/16 2100 10/30/16 0132  WBC 10.6* 10.7*  HGB 14.5 13.3  HCT 41.6 39.2  MCV 87.9 88.3  PLT 349 298   Cardiac Enzymes:  Recent Labs Lab 10/30/16 0132 10/30/16 0730  TROPONINI <0.03 <0.03   BNP: Invalid input(s): POCBNP CBG:  Recent Labs Lab 11/01/16 1118 11/01/16 1553 11/01/16 2116 11/02/16 0743 11/02/16 1128  GLUCAP 174* 249* 192* 226* 223*   D-Dimer No results for input(s): DDIMER in the last 72 hours. Hgb A1c No results for input(s): HGBA1C in the last 72 hours. Lipid Profile No results for input(s): CHOL, HDL, LDLCALC, TRIG, CHOLHDL, LDLDIRECT in the last 72 hours. Thyroid function studies No results for input(s): TSH, T4TOTAL, T3FREE, THYROIDAB in the last 72 hours.  Invalid input(s): FREET3 Anemia work up No results for input(s): VITAMINB12, FOLATE, FERRITIN, TIBC, IRON, RETICCTPCT in the last 72 hours. Microbiology Recent Results (from the past 240 hour(s))  MRSA PCR Screening     Status: None   Collection Time: 10/30/16  1:20 AM  Result Value Ref Range Status   MRSA by PCR NEGATIVE NEGATIVE Final    Comment:        The GeneXpert MRSA Assay (FDA approved for NASAL specimens only), is one  component of a comprehensive MRSA colonization surveillance program. It is not intended to diagnose MRSA infection nor to guide or monitor treatment for MRSA infections.   C difficile quick scan w PCR reflex     Status: None   Collection Time: 11/02/16  3:29 AM  Result Value Ref Range Status   C Diff antigen NEGATIVE NEGATIVE Final   C Diff toxin NEGATIVE NEGATIVE Final   C Diff interpretation No C. difficile detected.  Final     Discharge Instructions:   Discharge Instructions    Call MD for:    Complete by:  As directed    Suicidal thoughts.   Diet - low sodium heart healthy    Complete by:  As directed    Diet - low sodium heart healthy    Complete by:  As directed    Diet Carb Modified    Complete by:  As directed    Increase activity slowly    Complete by:  As directed    Increase activity slowly    Complete  by:  As directed        Medication List    TAKE these medications   ACCU-CHEK NANO SMARTVIEW w/Device Kit 1 Device by Does not apply route once a week.   Albiglutide 30 MG Pen Commonly known as:  TANZEUM Inject 30 mg into the skin once a week.   atorvastatin 20 MG tablet Commonly known as:  LIPITOR Take 1 tablet (20 mg total) by mouth daily.   budesonide-formoterol 160-4.5 MCG/ACT inhaler Commonly known as:  SYMBICORT INHALE 2 PUFFS INTO LUNGS TWICE A DAY   carvedilol 12.5 MG tablet Commonly known as:  COREG TAKE 1 TABLET BY MOUTH TWICE DAILY WITH A MEAL   cetirizine 10 MG tablet Commonly known as:  ZYRTEC TAKE 1 TABLET BY MOUTH DAILY What changed:  See the new instructions.   cyclobenzaprine 10 MG tablet Commonly known as:  FLEXERIL Take 1 tablet (10 mg total) by mouth daily as needed for muscle spasms.   diclofenac 75 MG EC tablet Commonly known as:  VOLTAREN Take 1 tablet (75 mg total) by mouth 2 (two) times daily.   fluticasone 50 MCG/ACT nasal spray Commonly known as:  FLONASE Place 2 sprays into both nostrils daily.     gabapentin 600 MG tablet Commonly known as:  NEURONTIN Take 2 tablets (1,200 mg total) by mouth 3 (three) times daily.   glucose blood test strip Commonly known as:  ACCU-CHEK SMARTVIEW USE TO TEST BLOOD SUGAR FOUR TIMES DAILY, and lancets 4/day   HYDROcodone-acetaminophen 10-325 MG tablet Commonly known as:  NORCO Take 1 tablet by mouth every 8 (eight) hours as needed for severe pain.   Insulin Glargine 100 UNIT/ML Solostar Pen Commonly known as:  LANTUS SOLOSTAR Inject 700 Units into the skin every morning. And pen needles 4/day   lisinopril 10 MG tablet Commonly known as:  PRINIVIL,ZESTRIL Take 1 tablet (10 mg total) by mouth daily.   nitroGLYCERIN 0.4 MG SL tablet Commonly known as:  NITROSTAT DISSOLVE 1 TABLET UNDER THE TONGUE EVERY 5 MINUTES AS NEEDED FOR CHEST PAIN.   pantoprazole 40 MG tablet Commonly known as:  PROTONIX TAKE 1 TABLET BY MOUTH EVERY DAY What changed:  See the new instructions.   pramipexole 0.125 MG tablet Commonly known as:  MIRAPEX TAKE 3 TABLETS BY MOUTH EVERY NIGHT AT BEDTIME What changed:  See the new instructions.   PROVENTIL HFA 108 (90 Base) MCG/ACT inhaler Generic drug:  albuterol INHALE 1 PUFF BY MOUTH EVERY 6 HOURS AS NEEDED FOR WHEEZING OR SHORTNESS OF BREATH.   ranitidine 150 MG tablet Commonly known as:  ZANTAC Take 1 tablet (150 mg total) by mouth 2 (two) times daily.   topiramate 25 MG tablet Commonly known as:  TOPAMAX Take 1 tablet (25 mg total) by mouth at bedtime.         Time coordinating discharge: Greater than 35 minutes  Signed:  Ilissa Rosner  Pager [phone] [facility] 11/02/2016, 3:29 PM

## 2016-11-02 NOTE — Progress Notes (Signed)
[facility] RN reports they cannot take patient today. Reports, pt. continues to have loose stools and they are at capacity. [facility] RN reports she will look at patient tomorrow.  LCSWA will continue to fax information to other psych facilities.  Kathrin Greathouse, Latanya Presser, MSW Clinical Social Worker 5E and Psychiatric Service Line [phone] 11/02/2016  8:26 AM

## 2016-11-02 NOTE — Consult Note (Signed)
[facility] Face-to-Face Psychiatry Consult   Reason for Consult:  Intentional drug overdose and cocaine intoxication Referring Physician:  Dr. Aileen Fass Patient Identification: [name] MRN:  [id number] Principal Diagnosis: Polysubstance overdose Diagnosis:   Patient Active Problem List   Diagnosis Date Noted  . Drug overdose [T50.901A] 10/30/2016  . Polysubstance overdose [T50.901A] 10/29/2016  . Depression [F32.9] 10/29/2016  . Suicidal ideation [R45.851] 10/29/2016  . Unilateral primary osteoarthritis, right knee [M17.11] 10/22/2016  . Diabetic neuropathy ([place]) [E11.40] 07/14/2016  . De Quervain's tenosynovitis, bilateral [M65.4] 11/01/2015  . Vitamin D deficiency [E55.9] 09/05/2015  . 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 Varicose veins of leg with complications [id number]  . 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 Rectal itching [L29.0] 11/17/2014  . Restless leg syndrome [G25.81] 10/17/2014  . Chronic sinusitis [J32.9] 07/18/2014  . Headache [R51] 07/15/2014  . Vision, loss, sudden [H53.139] 07/12/2014  . Urge incontinence [N39.41] 10/15/2013  . Encounter for chronic pain management [G89.29] 06/30/2013  . Low back pain [M54.5] 01/31/2013  . HLD (hyperlipidemia) [E78.5] 11/19/2012  . Chest pain [R07.9] 06/27/2012  . Right carpal tunnel syndrome [G56.01] 09/01/2011  . Bilateral knee pain [M25.561, M25.562] 09/01/2011  . Insulin dependent diabetes mellitus ([place]) [E11.9, Z79.4] 05/22/2008  . Morbid obesity ([place]) [E66.01] 05/22/2008  . OBESITY HYPOVENTILATION SYNDROME [E67.8] 05/22/2008  . DEPRESSION [F32.9] 05/22/2008  . Obstructive sleep apnea [G47.33] 05/22/2008  . Essential hypertension [I10] 05/22/2008  . ASTHMA [J45.909] 05/22/2008  . GERD [K21.9] 05/22/2008    Total Time spent with patient: 1 hour  Subjective:   [name] is a 40 y.o. female patient admitted with intentional drug overdose and substance abuse.  HPI:  [name] is a 40 y.o. Female, seen, chart reviewed for the face-to-face psychiatric consultation and evaluation of increased symptoms of depression and status post suicidal attempt by intentional drug overdose. Patient reported she has been depressed severely for the last 2 weeks and also extremely stressed about her husband of 10 years told her he is going to quit on her and left to his mother's home. Patient does not know why he told her and left her, since then she's been confused about what to do when she gets physical or emotionally become sick. Patient has a 27 years old daughter, currently patient mother has been taking care of her by taking time off from work. Patient mother told her she won't be able to help her on regular basis. Patient minimizes her current symptoms of depression and suicidal ideation and wishes to go home and take care of her and years old daughter. Patient continued to be at high risk to hurt herself and recommended to stay in hospital and probably need to go to the behavioral health Hospital when medically stable.  Past Psychiatric History: Noncontributory  11/02/2016 Interval history: Patient seen today face-to-face for the psychiatric consultation follow-up along with psychiatric LCSW. Patient is willing to participate acute psychiatric hospitalization under the same time concern about taking care of her 32 years old daughter who is currently under care of patient mother. Patient stated her sister is also able to care for her daughter while she's been in treatment. Patient prefer to complete her treatment within 2 weeks time. Patient is awake, alert and oriented time place person and situation. Patient continued to endorse symptoms of depression and suicidal ideation but minimizes her recent suicide attempt. Patient has no contact her relationship with her ex husband since being admitted to the hospital.  Risk to  Self: Is patient at risk for suicide?: Yes Risk to Others:    Prior Inpatient Therapy:   Prior Outpatient Therapy:    Past Medical History:  Past Medical History:  Diagnosis Date  . Alveolar hypoventilation   . Anemia    not on iron pill  . Arthritis   . Asthma   . Bipolar 2 disorder ([place])   . Carpal tunnel syndrome on right    recurrent  . Cellulitis 08/2010-08/2011  . Chronic pain   . COPD (chronic obstructive pulmonary disease) ([name])    Symbicort daily and Proventil as needed  . Costochondritis   . Depression   . Diabetes mellitus 2000   Type 2, Uncontrolled.Takes Lantus daily.Fasting blood sugar runs 150  . Dizziness    occasionally  . Drug-seeking behavior   . GERD (gastroesophageal reflux disease)    takes Pantoprazole and Zantac daily  . Headache    migraine-last one about a yr ago.Topamax daily  . HLD (hyperlipidemia)    takes Atorvastatin daily  . Hypertension    takes Lisinopril and Coreg daily  . Morbid obesity ([place])   . Muscle spasm    takes Flexeril as needed  . Nocturia   . Obstructive sleep apnea   . Peripheral neuropathy ([name])    takes Gabapentin daily  . Pneumonia    "walking" several yrs ago and as a baby  . Rectal fissure   . Restless leg   . Urinary frequency   . Varicose veins    Right medial thigh and Left leg     Past Surgical History:  Procedure Laterality Date  . CARPAL TUNNEL RELEASE    . CESAREAN SECTION    . KNEE ARTHROSCOPY Right 07/17/2010  . LEFT HEART CATHETERIZATION WITH CORONARY ANGIOGRAM N/A 07/27/2012   Procedure: LEFT HEART CATHETERIZATION WITH CORONARY ANGIOGRAM;  Surgeon: Sherren Mocha, MD;  [facility] C[facility] LAB;  Service: Cardiovascular;  Laterality: N/A;  . left knee surgery     screws she thinks  . MASS EXCISION N/A 06/29/2013   Procedure:  WIDE LOCAL EXCISION OF POSTERIOR NECK ABSCESS;  Surgeon: Ralene Ok, MD;  Location: [place];  Service: General;  Laterality: N/A;   Family History:  Family History  Problem Relation Age of Onset  . Diabetes Mother   . Hyperlipidemia  Mother   . Depression Mother   . Varicose Veins Mother   . Heart attack Paternal Uncle   . Heart disease Paternal Grandmother   . Heart attack Paternal Grandmother   . Heart attack Paternal Grandfather   . Heart disease Paternal Grandfather   . Heart attack Father    Family Psychiatric  History: Noncontributory Social History:  History  Alcohol Use No     History  Drug Use    Comment: OD attempts on home meds      Social History   Social History  . Marital status: Married    Spouse name: N/A  . Number of children: N/A  . Years of education: N/A   Social History Main Topics  . Smoking status: Former Smoker    Packs/day: 0.30    Years: 0.30    Types: Cigarettes    Quit date: 12/06/1993  . Smokeless tobacco: Never Used  . Alcohol use No  . Drug use:      Comment: OD attempts on home meds    . Sexual activity: Yes    Partners: Male   Other Topics Concern  . None   Social History  Narrative   Lives in [place] with her fiance and 39 yr old dtr.   Additional Social History:    Allergies:   Allergies  Allergen Reactions  . Kiwi Extract Shortness Of Breath  . Nubain [Nalbuphine Hcl] Other (See Comments)    "FEELS LIKE SOMETHING CRAWLING ON ME"    Labs:  Results for orders placed or performed during the hospital encounter of 10/29/16 (from the past 48 hour(s))  Glucose, capillary     Status: Abnormal   Collection Time: 10/31/16  4:22 PM  Result Value Ref Range   Glucose-Capillary 232 (H) 65 - 99 mg/dL  Glucose, capillary     Status: Abnormal   Collection Time: 10/31/16  7:39 PM  Result Value Ref Range   Glucose-Capillary 210 (H) 65 - 99 mg/dL  Glucose, capillary     Status: Abnormal   Collection Time: 11/01/16 12:26 AM  Result Value Ref Range   Glucose-Capillary 220 (H) 65 - 99 mg/dL  Glucose, capillary     Status: Abnormal   Collection Time: 11/01/16  4:28 AM  Result Value Ref Range   Glucose-Capillary 197 (H) 65 - 99 mg/dL  Glucose, capillary     Status:  Abnormal   Collection Time: 11/01/16  7:31 AM  Result Value Ref Range   Glucose-Capillary 182 (H) 65 - 99 mg/dL   Comment 1 Notify RN   Glucose, capillary     Status: Abnormal   Collection Time: 11/01/16 11:18 AM  Result Value Ref Range   Glucose-Capillary 174 (H) 65 - 99 mg/dL   Comment 1 Notify RN   Glucose, capillary     Status: Abnormal   Collection Time: 11/01/16  3:53 PM  Result Value Ref Range   Glucose-Capillary 249 (H) 65 - 99 mg/dL   Comment 1 Notify RN    Comment 2 Document in Chart   Glucose, capillary     Status: Abnormal   Collection Time: 11/01/16  9:16 PM  Result Value Ref Range   Glucose-Capillary 192 (H) 65 - 99 mg/dL  C difficile quick scan w PCR reflex     Status: None   Collection Time: 11/02/16  3:29 AM  Result Value Ref Range   C Diff antigen NEGATIVE NEGATIVE   C Diff toxin NEGATIVE NEGATIVE   C Diff interpretation No C. difficile detected.   Glucose, capillary     Status: Abnormal   Collection Time: 11/02/16  7:43 AM  Result Value Ref Range   Glucose-Capillary 226 (H) 65 - 99 mg/dL  Glucose, capillary     Status: Abnormal   Collection Time: 11/02/16 11:28 AM  Result Value Ref Range   Glucose-Capillary 223 (H) 65 - 99 mg/dL    Current Facility-Administered Medications  Medication Dose Route Frequency Provider Last Rate Last Dose  . acetaminophen (TYLENOL) tablet 650 mg  650 mg Oral Q6H PRN Lily Kocher, MD   650 mg at 10/30/16 0830   Or  . acetaminophen (TYLENOL) suppository 650 mg  650 mg Rectal Q6H PRN Lily Kocher, MD      . atorvastatin (LIPITOR) tablet 20 mg  20 mg Oral Daily Florencia Reasons, MD   20 mg at 11/02/16 1230  . carvedilol (COREG) tablet 3.125 mg  3.125 mg Oral BID WC Florencia Reasons, MD   3.125 mg at 11/02/16 1240  . famotidine (PEPCID) tablet 20 mg  20 mg Oral BID Baltazar Najjar [place], RPH   20 mg at 11/02/16 1230  . insulin aspart (novoLOG) injection 0-15  Units  0-15 Units Subcutaneous TID WC Gardiner Barefoot, NP   5 Units at 11/02/16 1233  .  insulin detemir (LEVEMIR) injection 15 Units  15 Units Subcutaneous QHS Lily Kocher, MD   15 Units at 11/01/16 2255  . lisinopril (PRINIVIL,ZESTRIL) tablet 5 mg  5 mg Oral Daily Florencia Reasons, MD   5 mg at 11/02/16 1226  . ondansetron (ZOFRAN) injection 4 mg  4 mg Intravenous Q6H PRN Karmen Bongo, MD   4 mg at 11/01/16 0201  . sodium chloride flush (NS) 0.9 % injection 3 mL  3 mL Intravenous Q12H Lily Kocher, MD   3 mL at 10/31/16 2131    Musculoskeletal: Strength & Muscle Tone: within normal limits Gait & Station: unable to stand Patient leans: N/A  Psychiatric Specialty Exam: Physical Exam  as per history and physical   ROS  complaining about depression and status post suicidal attempt by intentional drug overdose and also denied cocaine abuse. No Fever-chills, No Headache, No changes with Vision or hearing, reports vertigo No problems swallowing food or Liquids, No Chest pain, Cough or Shortness of Breath, No Abdominal pain, No Nausea or Vommitting, Bowel movements are regular, No Blood in stool or Urine, No dysuria, No new skin rashes or bruises, No new joints pains-aches,  No new weakness, tingling, numbness in any extremity, No recent weight gain or loss, No polyuria, polydypsia or polyphagia,   A full 10 point Review of Systems was done, except as stated above, all other Review of Systems were negative.  Blood pressure 133/84, pulse 92, temperature 97.7 F (36.5 C), temperature source Oral, resp. rate 18, height 5\' 2"  (1.575 m), weight 135.2 kg (298 lb 1.6 oz), SpO2 97 %.Body mass index is 54.52 kg/m.  General Appearance: Guarded  Eye Contact:  Good  Speech:  Clear and Coherent and Slow  Volume:  Decreased  Mood:  Anxious and Depressed  Affect:  Constricted and Depressed  Thought Process:  Coherent and Goal Directed  Orientation:  Full (Time, Place, and Person)  Thought Content:  Rumination  Suicidal Thoughts:  Yes.  with intent/plan  Homicidal Thoughts:  No   Memory:  Immediate;   Good Recent;   Fair Remote;   Fair  Judgement:  Impaired  Insight:  Shallow  Psychomotor Activity:  Decreased  Concentration:  Concentration: Fair and Attention Span: Fair  Recall:  [employer] of Knowledge:  Good  Language:  Negative  Akathisia:  Negative  Handed:  Right  AIMS (if indicated):     Assets:  Communication Skills Desire for Improvement Financial Resources/Insurance Housing Leisure Time Resilience Social Support  ADL's:  Intact  Cognition:  WNL  Sleep:        Treatment Plan Summary: 40 years old female with depression and status post suicidal attempt currently denies suicidal/homicidal ideation, intention or plans. Patient has no evidence of psychosis.  Case discussed with the psychiatric LCSW regarding acute inpatient psychiatric placement and patient meets criteria for acute psychiatric hospitalization based on increased symptoms of depression and status post suicidal attempt LCSW will contact family especially patient mother regarding patient 80 years old daughter will need to be cared while patient go for inpatient treatment. Patient does not like talking about calling Department of Social Services in case if needed additional help Patient reported her mother came to visit her yesterday   Safety concerns: Chief Technology Officer as she had a suicidal attempt with the medications Patient has sent a text message to her mother  regarding her intent to end her life Patient has been stressed about relationship issues with her husband Patient benefit from antidepressant medication when medically stable may start Wellbutrin XL 150 mg Patient meet criteria for acute psychiatric hospitalization when medically stable. Referred to the unit social service regarding appropriate psychiatric inpatient referrals   Disposition: Recommend psychiatric Inpatient admission when medically cleared. Supportive therapy provided about ongoing  stressors.  Ambrose Finland, MD 11/02/2016 3:32 PM

## 2016-11-02 NOTE — Care Management Note (Signed)
Case Management Note  Patient Details  Name: [name] MRN: [id number] Date of Birth: 06/22/1976  Subjective/Objective:Per attending-medical stable. D/c sumamry 11/01/16. Awaiting IP Psych-Psych CSW following.                    Action/Plan:d/c IP Psych.   Expected Discharge Date:                  Expected Discharge Plan:  Psychiatric Hospital  In-House Referral:  Clinical Social Work  Discharge planning Services  CM Consult  Post Acute Care Choice:    Choice offered to:     DME Arranged:    DME Agency:     HH Arranged:    [place] Agency:     Status of Service:  Completed, signed off  If discussed at [employer], dates discussed:    Additional Comments:  Dessa Phi, RN 11/02/2016, 1:00 PM

## 2016-11-03 ENCOUNTER — Encounter (HOSPITAL_COMMUNITY): Payer: Self-pay

## 2016-11-03 ENCOUNTER — Inpatient Hospital Stay (HOSPITAL_COMMUNITY)
Admission: AD | Admit: 2016-11-03 | Discharge: 2016-11-05 | DRG: 882 | Disposition: A | Discharge status: Home / Self Care | Payer: Medicaid Other | Source: Intra-hospital | Attending: Psychiatry | Admitting: Psychiatry

## 2016-11-03 DIAGNOSIS — G2581 Restless legs syndrome: Secondary | ICD-10-CM | POA: Diagnosis present

## 2016-11-03 DIAGNOSIS — E785 Hyperlipidemia, unspecified: Secondary | ICD-10-CM | POA: Diagnosis present

## 2016-11-03 DIAGNOSIS — M654 Radial styloid tenosynovitis [de Quervain]: Secondary | ICD-10-CM | POA: Diagnosis present

## 2016-11-03 DIAGNOSIS — E559 Vitamin D deficiency, unspecified: Secondary | ICD-10-CM | POA: Diagnosis present

## 2016-11-03 DIAGNOSIS — Z91148 Patient's other noncompliance with medication regimen for other reason: Secondary | ICD-10-CM

## 2016-11-03 DIAGNOSIS — J449 Chronic obstructive pulmonary disease, unspecified: Secondary | ICD-10-CM | POA: Diagnosis present

## 2016-11-03 DIAGNOSIS — F4323 Adjustment disorder with mixed anxiety and depressed mood: Secondary | ICD-10-CM | POA: Diagnosis not present

## 2016-11-03 DIAGNOSIS — Z794 Long term (current) use of insulin: Secondary | ICD-10-CM | POA: Diagnosis not present

## 2016-11-03 DIAGNOSIS — T50902A Poisoning by unspecified drugs, medicaments and biological substances, intentional self-harm, initial encounter: Secondary | ICD-10-CM | POA: Diagnosis not present

## 2016-11-03 DIAGNOSIS — E662 Morbid (severe) obesity with alveolar hypoventilation: Secondary | ICD-10-CM | POA: Diagnosis present

## 2016-11-03 DIAGNOSIS — E7800 Pure hypercholesterolemia, unspecified: Secondary | ICD-10-CM | POA: Diagnosis present

## 2016-11-03 DIAGNOSIS — E1142 Type 2 diabetes mellitus with diabetic polyneuropathy: Secondary | ICD-10-CM | POA: Diagnosis present

## 2016-11-03 DIAGNOSIS — R45851 Suicidal ideations: Secondary | ICD-10-CM | POA: Diagnosis not present

## 2016-11-03 DIAGNOSIS — F3181 Bipolar II disorder: Secondary | ICD-10-CM | POA: Diagnosis present

## 2016-11-03 DIAGNOSIS — Z8249 Family history of ischemic heart disease and other diseases of the circulatory system: Secondary | ICD-10-CM

## 2016-11-03 DIAGNOSIS — Z888 Allergy status to other drugs, medicaments and biological substances status: Secondary | ICD-10-CM

## 2016-11-03 DIAGNOSIS — I10 Essential (primary) hypertension: Secondary | ICD-10-CM | POA: Diagnosis present

## 2016-11-03 DIAGNOSIS — F332 Major depressive disorder, recurrent severe without psychotic features: Secondary | ICD-10-CM | POA: Insufficient documentation

## 2016-11-03 DIAGNOSIS — M1711 Unilateral primary osteoarthritis, right knee: Secondary | ICD-10-CM | POA: Diagnosis present

## 2016-11-03 DIAGNOSIS — Z6841 Body Mass Index (BMI) 40.0 and over, adult: Secondary | ICD-10-CM

## 2016-11-03 DIAGNOSIS — G8929 Other chronic pain: Secondary | ICD-10-CM | POA: Diagnosis present

## 2016-11-03 DIAGNOSIS — Z9889 Other specified postprocedural states: Secondary | ICD-10-CM | POA: Diagnosis not present

## 2016-11-03 DIAGNOSIS — Z915 Personal history of self-harm: Secondary | ICD-10-CM | POA: Diagnosis not present

## 2016-11-03 DIAGNOSIS — Z833 Family history of diabetes mellitus: Secondary | ICD-10-CM | POA: Diagnosis not present

## 2016-11-03 DIAGNOSIS — J329 Chronic sinusitis, unspecified: Secondary | ICD-10-CM | POA: Diagnosis present

## 2016-11-03 DIAGNOSIS — Z9114 Patient's other noncompliance with medication regimen: Secondary | ICD-10-CM | POA: Diagnosis not present

## 2016-11-03 DIAGNOSIS — Z91018 Allergy to other foods: Secondary | ICD-10-CM | POA: Diagnosis not present

## 2016-11-03 DIAGNOSIS — F32A Depression, unspecified: Secondary | ICD-10-CM | POA: Diagnosis present

## 2016-11-03 DIAGNOSIS — K219 Gastro-esophageal reflux disease without esophagitis: Secondary | ICD-10-CM | POA: Diagnosis present

## 2016-11-03 DIAGNOSIS — Z818 Family history of other mental and behavioral disorders: Secondary | ICD-10-CM

## 2016-11-03 DIAGNOSIS — F329 Major depressive disorder, single episode, unspecified: Secondary | ICD-10-CM | POA: Diagnosis present

## 2016-11-03 LAB — GLUCOSE, CAPILLARY
GLUCOSE-CAPILLARY: 250 mg/dL — AB (ref 65–99)
Glucose-Capillary: 193 mg/dL — ABNORMAL HIGH (ref 65–99)
Glucose-Capillary: 254 mg/dL — ABNORMAL HIGH (ref 65–99)
Glucose-Capillary: 226 mg/dL — ABNORMAL HIGH (ref 65–99)

## 2016-11-03 MED ORDER — HYDROXYZINE HCL 25 MG PO TABS
25.0000 mg | ORAL_TABLET | Freq: Four times a day (QID) | ORAL | Status: DC | PRN
  Administered 2016-11-04: 25 mg via ORAL
  Filled 2016-11-03: qty 1

## 2016-11-03 MED ORDER — MAGNESIUM HYDROXIDE 400 MG/5ML PO SUSP: 30.0000 mL | Freq: Every day | ORAL | Status: DC | PRN

## 2016-11-03 MED ORDER — ALUM & MAG HYDROXIDE-SIMETH 200-200-20 MG/5ML PO SUSP: 30.0000 mL | ORAL | Status: DC | PRN

## 2016-11-03 MED ORDER — INSULIN ASPART 100 UNIT/ML ~~LOC~~ SOLN
0.0000 [IU] | Freq: Three times a day (TID) | SUBCUTANEOUS | Status: DC
  Administered 2016-11-03: 8 [IU] via SUBCUTANEOUS
  Administered 2016-11-04 (×2): 5 [IU] via SUBCUTANEOUS
  Administered 2016-11-04: 11 [IU] via SUBCUTANEOUS
  Administered 2016-11-05: 5 [IU] via SUBCUTANEOUS

## 2016-11-03 MED ORDER — ONDANSETRON HCL 4 MG/2ML IJ SOLN: 4.0000 mg | Freq: Four times a day (QID) | INTRAMUSCULAR | Status: DC | PRN

## 2016-11-03 MED ORDER — ACETAMINOPHEN 325 MG PO TABS
650.0000 mg | ORAL_TABLET | Freq: Four times a day (QID) | ORAL | Status: DC | PRN
  Administered 2016-11-03 – 2016-11-04 (×2): 650 mg via ORAL
  Filled 2016-11-03: qty 2

## 2016-11-03 MED ORDER — INSULIN DETEMIR 100 UNIT/ML ~~LOC~~ SOLN
15.0000 [IU] | Freq: Every day | SUBCUTANEOUS | Status: DC
  Administered 2016-11-03 – 2016-11-04 (×2): 15 [IU] via SUBCUTANEOUS

## 2016-11-03 MED ORDER — LISINOPRIL 5 MG PO TABS
5.0000 mg | ORAL_TABLET | Freq: Every day | ORAL | Status: DC
  Administered 2016-11-04 – 2016-11-05 (×2): 5 mg via ORAL
  Filled 2016-11-03 (×3): qty 1

## 2016-11-03 MED ORDER — FAMOTIDINE 20 MG PO TABS
20.0000 mg | ORAL_TABLET | Freq: Two times a day (BID) | ORAL | Status: DC
  Administered 2016-11-03 – 2016-11-05 (×4): 20 mg via ORAL
  Filled 2016-11-03 (×8): qty 1

## 2016-11-03 MED ORDER — TRAZODONE HCL 50 MG PO TABS
50.0000 mg | ORAL_TABLET | Freq: Every evening | ORAL | Status: DC | PRN
  Administered 2016-11-03 – 2016-11-04 (×2): 50 mg via ORAL
  Filled 2016-11-03: qty 1

## 2016-11-03 MED ORDER — ATORVASTATIN CALCIUM 20 MG PO TABS
20.0000 mg | ORAL_TABLET | Freq: Every day | ORAL | Status: DC
  Administered 2016-11-04 – 2016-11-05 (×2): 20 mg via ORAL
  Filled 2016-11-03 (×3): qty 1

## 2016-11-03 MED ORDER — GABAPENTIN 300 MG PO CAPS
600.0000 mg | ORAL_CAPSULE | Freq: Every day | ORAL | Status: DC
  Administered 2016-11-03: 600 mg via ORAL
  Filled 2016-11-03 (×3): qty 2

## 2016-11-03 MED ORDER — CARVEDILOL 3.125 MG PO TABS
3.1250 mg | ORAL_TABLET | Freq: Two times a day (BID) | ORAL | Status: DC
  Administered 2016-11-04 (×2): 3.125 mg via ORAL
  Filled 2016-11-03 (×5): qty 1

## 2016-11-03 MED ORDER — TRAZODONE HCL 50 MG PO TABS
50.0000 mg | ORAL_TABLET | Freq: Every evening | ORAL | Status: DC | PRN
  Filled 2016-11-03: qty 1

## 2016-11-03 MED ORDER — ACETAMINOPHEN 325 MG PO TABS
650.0000 mg | ORAL_TABLET | Freq: Four times a day (QID) | ORAL | Status: DC | PRN
  Filled 2016-11-03: qty 2

## 2016-11-03 NOTE — Discharge Summary (Signed)
Physician Discharge Summary  [name] [id number] DOB: 08-Jan-1976 DOA: 10/29/2016  PCP: [name], MD  Admit date: 10/29/2016 Discharge date: 11/03/2016  Admitted From: Home Discharge disposition: [facility]  Time spent: Less than 25 minutes   Recommendations for Outpatient Follow-Up:   1. Patient is being discharged [facility] for further inpatient psychiatric treatment.   Discharge Diagnosis:   Principal Problem:    Polysubstance overdose Active Problems:    Insulin dependent diabetes mellitus ([place])    Morbid obesity ([place])    Essential hypertension    Depression    Suicidal ideation    Drug overdose    Cocaine positive urine drug screen    Prolonged QTc interval    Discharge Condition: Improved.  Diet recommendation: Low sodium, heart healthy.  Carbohydrate-modified.    History of Present Illness:   40 y.o.woman with a history of morbid obesity, HTN, HLD, uncontrolled Type 2 DM, COPD (she is not on home oxygen), OSA (she wears CPAP at night), GERD, and depression who was found down in her home after apparent polysubstance overdose.  Hospital Course by Problem:   Principal Problem:   Polysubstance overdose/depression/suicidal ideation Patient is awake and alert and tells me she took muscle relaxers. Does not want to go to [facility] but understands that it is important for her to do so. Toxic encephalopathy has now resolved. Status post psychiatric evaluation with inpatient [facility] transfer recommended. The patient is medically stable for transfer. A bed at behavioral health psychiatric facility was available as of 11/29  Active Problems:   Insulin dependent diabetes mellitus ([name]) Currently being managed with moderate scale SSI and Levemir 15 units daily. Resume outpatient regimen with close follow-up with endocrinologist at discharge. The patient is felt to be noncompliant with medical treatment.    Morbid obesity ([place]) Weight loss encouraged.    Essential  hypertension  Continue Coreg.    Prolonged QTC: On admission 588, repeated 487. Patient is medically stable to transfer to facility.   Medical Consultants:    Psychiatry   Discharge Exam:   Vitals:   11/03/16 1257 11/03/16 1300  BP: 133/89   Pulse: 91   Resp:  18  Temp:  98.4 F (36.9 C)   Vitals:   11/02/16 1427 11/03/16 0637 11/03/16 1257 11/03/16 1300  BP: 133/84 110/60 133/89   Pulse: 92 84 91   Resp: _0 Temp: 97.7 F (36.5 C) 97.4 F (36.3 C)  98.4 F (36.9 C)  TempSrc: Oral Oral    SpO2: 97% 99%  98%  Weight:      Height:        General exam: Appears calm and comfortable. Morbidly obese Respiratory system: Clear auscultation bilaterally Cardiovascular: Regular rate and rhythm, S1-S2  Gastrointestinal system: Soft, obese, nontender, positive bowel sounds Extremities: No clubbing,  or cyanosis. Trace edema. Psychiatry: Judgement and insight appear impaired. Mood & affect flat/depressed.    The results of significant diagnostics from this hospitalization (including imaging, microbiology, ancillary and laboratory) are listed below for reference.     Procedures and Diagnostic Studies:   Dg Chest 1 View  Result Date: 10/30/2016 CLINICAL DATA:  Acute onset of tachycardia.  Initial encounter. EXAM: CHEST 1 VIEW COMPARISON:  Chest radiograph performed 06/26/2016 FINDINGS: The lungs are hypoexpanded. Vascular crowding and vascular congestion are noted. There is no evidence of focal opacification, pleural effusion or pneumothorax. The cardiomediastinal silhouette is mildly enlarged. No acute osseous abnormalities are seen. IMPRESSION: Lungs hypoexpanded. Vascular congestion and mild  cardiomegaly. No definite focal airspace consolidation seen. Electronically Signed   By: Garald Balding M.D.   On: 10/30/2016 02:08     Labs:   Basic Metabolic Panel:  Recent Labs Lab 10/29/16 2100 10/30/16 0132 10/31/16 0536  NA 134* 136 139  K 3.5 3.8 3.2*  CL 99*  104 106  CO2 _0 GLUCOSE 383* 306* 168*  BUN _1 CREATININE 0.56 0.51 0.53  CALCIUM 8.9 8.4* 8.2*  MG  --   --  1.8   GFR Estimated Creatinine Clearance: 126.6 mL/min (by C-G formula based on SCr of 0.53 mg/dL). Liver Function Tests:  Recent Labs Lab 10/29/16 2100 10/30/16 0132  AST 18 15  ALT 19 19  ALKPHOS 96 81  BILITOT 0.9 1.0  PROT 7.9 6.6  ALBUMIN 3.6 2.9*   No results for input(s): LIPASE, AMYLASE in the last 168 hours. No results for input(s): AMMONIA in the last 168 hours. Coagulation profile  Recent Labs Lab 10/30/16 0132  INR 0.97    CBC:  Recent Labs Lab 10/29/16 2100 10/30/16 0132  WBC 10.6* 10.7*  HGB 14.5 13.3  HCT 41.6 39.2  MCV 87.9 88.3  PLT 349 298   Cardiac Enzymes:  Recent Labs Lab 10/30/16 0132 10/30/16 0730  TROPONINI <0.03 <0.03   BNP: Invalid input(s): POCBNP CBG:  Recent Labs Lab 11/02/16 1128 11/02/16 1655 11/02/16 2008 11/03/16 0759 11/03/16 1126  GLUCAP 223* 237* 209* 193* 250*   D-Dimer No results for input(s): DDIMER in the last 72 hours. Hgb A1c No results for input(s): HGBA1C in the last 72 hours. Lipid Profile No results for input(s): CHOL, HDL, LDLCALC, TRIG, CHOLHDL, LDLDIRECT in the last 72 hours. Thyroid function studies No results for input(s): TSH, T4TOTAL, T3FREE, THYROIDAB in the last 72 hours.  Invalid input(s): FREET3 Anemia work up No results for input(s): VITAMINB12, FOLATE, FERRITIN, TIBC, IRON, RETICCTPCT in the last 72 hours. Microbiology Recent Results (from the past 240 hour(s))  MRSA PCR Screening     Status: None   Collection Time: 10/30/16  1:20 AM  Result Value Ref Range Status   MRSA by PCR NEGATIVE NEGATIVE Final    Comment:        The GeneXpert MRSA Assay (FDA approved for NASAL specimens only), is one component of a comprehensive MRSA colonization surveillance program. It is not intended to diagnose MRSA infection nor to guide or monitor treatment  for MRSA infections.   C difficile quick scan w PCR reflex     Status: None   Collection Time: 11/02/16  3:29 AM  Result Value Ref Range Status   C Diff antigen NEGATIVE NEGATIVE Final   C Diff toxin NEGATIVE NEGATIVE Final   C Diff interpretation No C. difficile detected.  Final     Discharge Instructions:   Discharge Instructions    Call MD for:    Complete by:  As directed    Suicidal thoughts.   Diet - low sodium heart healthy    Complete by:  As directed    Diet - low sodium heart healthy    Complete by:  As directed    Diet Carb Modified    Complete by:  As directed    Increase activity slowly    Complete by:  As directed    Increase activity slowly    Complete by:  As directed        Medication List    TAKE these medications   ACCU-CHEK  NANO SMARTVIEW w/Device Kit 1 Device by Does not apply route once a week.   Albiglutide 30 MG Pen Commonly known as:  TANZEUM Inject 30 mg into the skin once a week.   atorvastatin 20 MG tablet Commonly known as:  LIPITOR Take 1 tablet (20 mg total) by mouth daily.   budesonide-formoterol 160-4.5 MCG/ACT inhaler Commonly known as:  SYMBICORT INHALE 2 PUFFS INTO LUNGS TWICE A DAY   carvedilol 12.5 MG tablet Commonly known as:  COREG TAKE 1 TABLET BY MOUTH TWICE DAILY WITH A MEAL   cetirizine 10 MG tablet Commonly known as:  ZYRTEC TAKE 1 TABLET BY MOUTH DAILY What changed:  See the new instructions.   cyclobenzaprine 10 MG tablet Commonly known as:  FLEXERIL Take 1 tablet (10 mg total) by mouth daily as needed for muscle spasms.   diclofenac 75 MG EC tablet Commonly known as:  VOLTAREN Take 1 tablet (75 mg total) by mouth 2 (two) times daily.   fluticasone 50 MCG/ACT nasal spray Commonly known as:  FLONASE Place 2 sprays into both nostrils daily.   gabapentin 600 MG tablet Commonly known as:  NEURONTIN Take 2 tablets (1,200 mg total) by mouth 3 (three) times daily.   glucose blood test strip Commonly  known as:  ACCU-CHEK SMARTVIEW USE TO TEST BLOOD SUGAR FOUR TIMES DAILY, and lancets 4/day   HYDROcodone-acetaminophen 10-325 MG tablet Commonly known as:  NORCO Take 1 tablet by mouth every 8 (eight) hours as needed for severe pain.   Insulin Glargine 100 UNIT/ML Solostar Pen Commonly known as:  LANTUS SOLOSTAR Inject 700 Units into the skin every morning. And pen needles 4/day   lisinopril 10 MG tablet Commonly known as:  PRINIVIL,ZESTRIL Take 1 tablet (10 mg total) by mouth daily.   nitroGLYCERIN 0.4 MG SL tablet Commonly known as:  NITROSTAT DISSOLVE 1 TABLET UNDER THE TONGUE EVERY 5 MINUTES AS NEEDED FOR CHEST PAIN.   pantoprazole 40 MG tablet Commonly known as:  PROTONIX TAKE 1 TABLET BY MOUTH EVERY DAY What changed:  See the new instructions.   pramipexole 0.125 MG tablet Commonly known as:  MIRAPEX TAKE 3 TABLETS BY MOUTH EVERY NIGHT AT BEDTIME What changed:  See the new instructions.   PROVENTIL HFA 108 (90 Base) MCG/ACT inhaler Generic drug:  albuterol INHALE 1 PUFF BY MOUTH EVERY 6 HOURS AS NEEDED FOR WHEEZING OR SHORTNESS OF BREATH.   ranitidine 150 MG tablet Commonly known as:  ZANTAC Take 1 tablet (150 mg total) by mouth 2 (two) times daily.   topiramate 25 MG tablet Commonly known as:  TOPAMAX Take 1 tablet (25 mg total) by mouth at bedtime.         Time coordinating discharge: Greater than 35 minutes  Signed:  Annita Brod  Pager [phone] [facility] 11/03/2016, 4:10 PM

## 2016-11-03 NOTE — Progress Notes (Signed)
[facility] confirms pt. husband can bring her CPAP. Patient consent form received. RN given report number [facility] called for transport.  No other needs identified.   Kathrin Greathouse, Latanya Presser, MSW Clinical Social Worker 5E and Psychiatric Service Line [phone] 11/03/2016  3:30 PM

## 2016-11-03 NOTE — Progress Notes (Signed)
[name] is a 40 year old female being admitted voluntarily to [facility] from [facility].  She came to the ED after intentional ingestion of unknown amount of Flexaril.  She has been on a medical floor since 10/29/16.  She reported history of depression and has been on psychiatric medications in the past.  She last took psychotropic 10 years ago.  She does report that she has a therapist that she sees on a regular basis.  She currently denies having suicidal ideation and stated that "I don't want to do that again, I am ready to go home."  She denies any pain or discomfort and appears to be in no physical distress.  She has multiple medical problems and states that she uses a CPAP and family is going to bring that in tonight.  Admission paperwork completed and signed.  Belongings searched and secured in locker # 26.  Skin assessment completed and no skin issues noted.  Q 15 minute checks initiated for safety.  We will monitor the progress towards her goals.
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
## 2016-11-03 NOTE — Progress Notes (Addendum)
[place] faxed patient clinical information to inpatient psych facilities. Spoke to [place] at [facility], will review patient clinical information.  [facility] offered bed,  AC request pt. can come only if she has her own CPAP machine, as they do not provide them. Patient signed voluntary consent form, Patient reports her husband can bring CPAP machine.  Called to inform Mendel Ryder, will attempt to call again.   Kathrin Greathouse, Latanya Presser, MSW Clinical Social Worker 5E and Psychiatric Service Line [phone] 11/03/2016  10:52 AM

## 2016-11-03 NOTE — Tx Team (Signed)
Initial Treatment Plan 11/03/2016 4:53 PM [name] [id number]    PATIENT STRESSORS: Financial difficulties Health problems   PATIENT STRENGTHS: Capable of independent living Communication skills General fund of knowledge   PATIENT IDENTIFIED PROBLEMS: Depression  Anxiety  Suicide attempt/OD  "Get better"  "Learn a new coping skill"             DISCHARGE CRITERIA:  Improved stabilization in mood, thinking, and/or behavior Verbal commitment to aftercare and medication compliance  PRELIMINARY DISCHARGE PLAN: Outpatient therapy Medication management  PATIENT/FAMILY INVOLVEMENT: This treatment plan has been presented to and reviewed with the patient, [name].  The patient and family have been given the opportunity to ask questions and make suggestions.  Windell Moment, RN 11/03/2016, 4:53 PM

## 2016-11-03 NOTE — Progress Notes (Signed)
Pt attended the evening NA speaker meeting. Pt was engaged and appropriate. Clint Bolder, NT 11/03/16 11:42 PM

## 2016-11-04 DIAGNOSIS — Z8249 Family history of ischemic heart disease and other diseases of the circulatory system: Secondary | ICD-10-CM

## 2016-11-04 DIAGNOSIS — Z79899 Other long term (current) drug therapy: Secondary | ICD-10-CM

## 2016-11-04 DIAGNOSIS — Z794 Long term (current) use of insulin: Secondary | ICD-10-CM

## 2016-11-04 DIAGNOSIS — Z888 Allergy status to other drugs, medicaments and biological substances status: Secondary | ICD-10-CM

## 2016-11-04 DIAGNOSIS — Z9889 Other specified postprocedural states: Secondary | ICD-10-CM

## 2016-11-04 DIAGNOSIS — Z818 Family history of other mental and behavioral disorders: Secondary | ICD-10-CM

## 2016-11-04 DIAGNOSIS — F4323 Adjustment disorder with mixed anxiety and depressed mood: Secondary | ICD-10-CM | POA: Diagnosis present

## 2016-11-04 DIAGNOSIS — Z91018 Allergy to other foods: Secondary | ICD-10-CM

## 2016-11-04 DIAGNOSIS — Z9114 Patient's other noncompliance with medication regimen: Secondary | ICD-10-CM

## 2016-11-04 DIAGNOSIS — Z833 Family history of diabetes mellitus: Secondary | ICD-10-CM

## 2016-11-04 LAB — GLUCOSE, CAPILLARY
GLUCOSE-CAPILLARY: 225 mg/dL — AB (ref 65–99)
GLUCOSE-CAPILLARY: 247 mg/dL — AB (ref 65–99)
Glucose-Capillary: 320 mg/dL — ABNORMAL HIGH (ref 65–99)
Glucose-Capillary: 268 mg/dL — ABNORMAL HIGH (ref 65–99)

## 2016-11-04 MED ORDER — CARVEDILOL 3.125 MG PO TABS: 3.1250 mg | ORAL_TABLET | Freq: Two times a day (BID) | ORAL | 0 refills | Status: DC

## 2016-11-04 MED ORDER — TRAZODONE HCL 50 MG PO TABS: 50.0000 mg | ORAL_TABLET | Freq: Every evening | ORAL | 0 refills | Status: DC | PRN

## 2016-11-04 MED ORDER — LISINOPRIL 5 MG PO TABS: 5.0000 mg | ORAL_TABLET | Freq: Every day | ORAL | 0 refills | Status: DC

## 2016-11-04 MED ORDER — GABAPENTIN 300 MG PO CAPS
600.0000 mg | ORAL_CAPSULE | Freq: Three times a day (TID) | ORAL | Status: DC
  Administered 2016-11-04 – 2016-11-05 (×3): 600 mg via ORAL
  Filled 2016-11-04 (×6): qty 2

## 2016-11-04 MED ORDER — GABAPENTIN 300 MG PO CAPS: 600.0000 mg | ORAL_CAPSULE | Freq: Three times a day (TID) | ORAL | 0 refills | Status: DC

## 2016-11-04 MED ORDER — HYDROXYZINE HCL 25 MG PO TABS: 25.0000 mg | ORAL_TABLET | Freq: Four times a day (QID) | ORAL | 0 refills | Status: DC | PRN

## 2016-11-04 MED ORDER — ATORVASTATIN CALCIUM 20 MG PO TABS: 20.0000 mg | ORAL_TABLET | Freq: Every day | ORAL | 0 refills | Status: DC

## 2016-11-04 NOTE — BHH Counselor (Signed)
Psychosocial assessment not required as patient is discharging from the hospital within 48 hours of admission. CSW met with pt and discussed aftercare plan-pt plans to resume services with her current therapist Marjie Skiff [phone]. She is not on psychiatric medication nor does she plan to be put on medication, therefore a psychiatry referral is not necessary. However, CSW provided pt with [facility] and [place] information if she changes her mind down the road. Pt provided with [facility] information as well.  Maxie Better, MSW, LCSW Clinical Social Worker 11/04/2016 2:37 PM

## 2016-11-04 NOTE — Tx Team (Signed)
Interdisciplinary Treatment and Diagnostic Plan Update  11/04/2016 Time of Session: 9:30AM [name] MRN: [id number]  Principal Diagnosis: Adjustment disorder with mixed anxiety and depressed mood  Secondary Diagnoses: Principal Problem:   Adjustment disorder with mixed anxiety and depressed mood Active Problems:   History of medication noncompliance   Current Medications:  Current Facility-Administered Medications  Medication Dose Route Frequency Provider Last Rate Last Dose  . acetaminophen (TYLENOL) tablet 650 mg  650 mg Oral Q6H PRN Ethelene Hal, NP   650 mg at 11/04/16 0859  . alum & mag hydroxide-simeth (MAALOX/MYLANTA) 200-200-20 MG/5ML suspension 30 mL  30 mL Oral Q4H PRN Ethelene Hal, NP      . atorvastatin (LIPITOR) tablet 20 mg  20 mg Oral Daily Ethelene Hal, NP   20 mg at 11/04/16 0810  . carvedilol (COREG) tablet 3.125 mg  3.125 mg Oral BID WC Ethelene Hal, NP   3.125 mg at 11/04/16 0810  . famotidine (PEPCID) tablet 20 mg  20 mg Oral BID Ethelene Hal, NP   20 mg at 11/04/16 0810  . gabapentin (NEURONTIN) capsule 600 mg  600 mg Oral TID Linard Millers, MD      . hydrOXYzine (ATARAX/VISTARIL) tablet 25 mg  25 mg Oral Q6H PRN Ethelene Hal, NP   25 mg at 11/04/16 0109  . insulin aspart (novoLOG) injection 0-15 Units  0-15 Units Subcutaneous TID WC Ethelene Hal, NP   5 Units at 11/04/16 [phone]  . insulin detemir (LEVEMIR) injection 15 Units  15 Units Subcutaneous QHS Ethelene Hal, NP   15 Units at 11/03/16 2223  . lisinopril (PRINIVIL,ZESTRIL) tablet 5 mg  5 mg Oral Daily Ethelene Hal, NP   5 mg at 11/04/16 0810  . magnesium hydroxide (MILK OF MAGNESIA) suspension 30 mL  30 mL Oral Daily PRN Ethelene Hal, NP      . ondansetron [facility]) injection 4 mg  4 mg Intravenous Q6H PRN Ethelene Hal, NP      . traZODone (DESYREL) tablet 50 mg  50 mg Oral QHS PRN Ethelene Hal, NP   50 mg  at 11/03/16 2221   PTA Medications: Prescriptions Prior to Admission  Medication Sig Dispense Refill Last Dose  . Albiglutide (TANZEUM) 30 MG PEN Inject 30 mg into the skin once a week. 4 each 11 Past Week at Unknown time  . atorvastatin (LIPITOR) 20 MG tablet Take 1 tablet (20 mg total) by mouth daily. 90 tablet 3 10/29/2016 at Unknown time  . budesonide-formoterol (SYMBICORT) 160-4.5 MCG/ACT inhaler INHALE 2 PUFFS INTO LUNGS TWICE A DAY 10.2 g 5 Past Week at Unknown time  . carvedilol (COREG) 12.5 MG tablet TAKE 1 TABLET BY MOUTH TWICE DAILY WITH A MEAL 60 tablet 5 10/29/2016 at pm  . cetirizine (ZYRTEC) 10 MG tablet TAKE 1 TABLET BY MOUTH DAILY (Patient taking differently: TAKE 10 MG BY MOUTH DAILY AS NEEDED FOR ALLERGIES) 30 tablet 11 Past Week at Unknown time  . cyclobenzaprine (FLEXERIL) 10 MG tablet Take 1 tablet (10 mg total) by mouth daily as needed for muscle spasms. 30 tablet 3 10/29/2016 at Unknown time  . fluticasone (FLONASE) 50 MCG/ACT nasal spray Place 2 sprays into both nostrils daily. 16 g 6 Past Month at Unknown time  . gabapentin (NEURONTIN) 600 MG tablet Take 2 tablets (1,200 mg total) by mouth 3 (three) times daily. 180 tablet 0 10/29/2016 at Unknown time  . HYDROcodone-acetaminophen (NORCO) 10-325 MG tablet  Take 1 tablet by mouth every 8 (eight) hours as needed for severe pain. 90 tablet 0 10/29/2016 at Unknown time  . Insulin Glargine (LANTUS SOLOSTAR) 100 UNIT/ML Solostar Pen Inject 700 Units into the skin every morning. And pen needles 4/day 90 pen 11 10/29/2016 at Unknown time  . lisinopril (PRINIVIL,ZESTRIL) 10 MG tablet Take 1 tablet (10 mg total) by mouth daily. 30 tablet 5 10/29/2016 at pmtime  . nitroGLYCERIN (NITROSTAT) 0.4 MG SL tablet DISSOLVE 1 TABLET UNDER THE TONGUE EVERY 5 MINUTES AS NEEDED FOR CHEST PAIN. 30 tablet 1 Past Month at Unknown time  . pantoprazole (PROTONIX) 40 MG tablet TAKE 1 TABLET BY MOUTH EVERY DAY (Patient taking differently: TAKE 40 MG BY  MOUTH EVERY DAY) 90 tablet 3 10/29/2016 at Unknown time  . pramipexole (MIRAPEX) 0.125 MG tablet TAKE 3 TABLETS BY MOUTH EVERY NIGHT AT BEDTIME (Patient taking differently: TAKE 0.375 MG BY MOUTH EVERY NIGHT AT BEDTIME) 270 tablet 1 10/29/2016 at Unknown time  . PROVENTIL HFA 108 (90 BASE) MCG/ACT inhaler INHALE 1 PUFF BY MOUTH EVERY 6 HOURS AS NEEDED FOR WHEEZING OR SHORTNESS OF BREATH. 6.7 g 11 Past Week at Unknown time  . Blood Glucose Monitoring Suppl (ACCU-CHEK NANO SMARTVIEW) w/Device KIT 1 Device by Does not apply route once a week. 1 kit 0   . diclofenac (VOLTAREN) 75 MG EC tablet Take 1 tablet (75 mg total) by mouth 2 (two) times daily. (Patient not taking: Reported on 11/03/2016) 20 tablet 0 Not Taking at Unknown time  . glucose blood (ACCU-CHEK SMARTVIEW) test strip USE TO TEST BLOOD SUGAR FOUR TIMES DAILY, and lancets 4/day 100 each 11   . ranitidine (ZANTAC) 150 MG tablet Take 1 tablet (150 mg total) by mouth 2 (two) times daily. (Patient not taking: Reported on 11/03/2016) 60 tablet 1 Not Taking at Unknown time  . topiramate (TOPAMAX) 25 MG tablet Take 1 tablet (25 mg total) by mouth at bedtime. (Patient not taking: Reported on 11/03/2016) 30 tablet 3 Not Taking at Unknown time    Patient Stressors: Financial difficulties Health problems  Patient Strengths: Capable of independent living Curator fund of knowledge  Treatment Modalities: Medication Management, Group therapy, Case management,  1 to 1 session with clinician, Psychoeducation, Recreational therapy.   Physician Treatment Plan for Primary Diagnosis: Adjustment disorder with mixed anxiety and depressed mood Long Term Goal(s): Improvement in symptoms so as ready for discharge Improvement in symptoms so as ready for discharge   Short Term Goals: Ability to demonstrate self-control will improve Ability to identify and develop effective coping behaviors will improve  Medication Management: Evaluate  patient's response, side effects, and tolerance of medication regimen.  Therapeutic Interventions: 1 to 1 sessions, Unit Group sessions and Medication administration.  Evaluation of Outcomes: Progressing  Physician Treatment Plan for Secondary Diagnosis: Principal Problem:   Adjustment disorder with mixed anxiety and depressed mood Active Problems:   History of medication noncompliance  Long Term Goal(s): Improvement in symptoms so as ready for discharge Improvement in symptoms so as ready for discharge   Short Term Goals: Ability to demonstrate self-control will improve Ability to identify and develop effective coping behaviors will improve     Medication Management: Evaluate patient's response, side effects, and tolerance of medication regimen.  Therapeutic Interventions: 1 to 1 sessions, Unit Group sessions and Medication administration.  Evaluation of Outcomes: Progressing   RN Treatment Plan for Primary Diagnosis: Adjustment disorder with mixed anxiety and depressed mood Long Term Goal(s): Knowledge of  disease and therapeutic regimen to maintain health will improve  Short Term Goals: Ability to remain free from injury will improve, Ability to disclose and discuss suicidal ideas and Ability to identify and develop effective coping behaviors will improve  Medication Management: RN will administer medications as ordered by provider, will assess and evaluate patient's response and provide education to patient for prescribed medication. RN will report any adverse and/or side effects to prescribing provider.  Therapeutic Interventions: 1 on 1 counseling sessions, Psychoeducation, Medication administration, Evaluate responses to treatment, Monitor vital signs and CBGs as ordered, Perform/monitor CIWA, COWS, AIMS and Fall Risk screenings as ordered, Perform wound care treatments as ordered.  Evaluation of Outcomes: Progressing   LCSW Treatment Plan for Primary Diagnosis: Adjustment  disorder with mixed anxiety and depressed mood Long Term Goal(s): Safe transition to appropriate next level of care at discharge, Engage patient in therapeutic group addressing interpersonal concerns.  Short Term Goals: Engage patient in aftercare planning with referrals and resources, Facilitate patient progression through stages of change regarding substance use diagnoses and concerns and Identify triggers associated with mental health/substance abuse issues  Therapeutic Interventions: Assess for all discharge needs, 1 to 1 time with Social worker, Explore available resources and support systems, Assess for adequacy in community support network, Educate family and significant other(s) on suicide prevention, Complete Psychosocial Assessment, Interpersonal group therapy.  Evaluation of Outcomes: Progressing   Progress in Treatment: Attending groups: Yes. Participating in groups: Yes. Taking medication as prescribed: Yes. Toleration medication: Yes. Family/Significant other contact made: No, will contact:  family member if patient consents Patient understands diagnosis: Yes. Discussing patient identified problems/goals with staff: Yes. Medical problems stabilized or resolved: Yes. Denies suicidal/homicidal ideation: Yes. Self report  Issues/concerns per patient self-inventory: No. Other: n/a  New problem(s) identified: No, Describe:  n/a  New Short Term/Long Term Goal(s):  Discharge Plan or Barriers:   Reason for Continuation of Hospitalization: Depression Medication stabilization Suicidal ideation  Estimated Length of Stay: 3-5 days   Attendees: Patient: 11/04/2016 11:08 AM  Physician: Dr. Odelia Gage MD 11/04/2016 11:08 AM  Nursing: Vanna Scotland RN 11/04/2016 11:08 AM  RN Care Manager: Lars Pinks CM 11/04/2016 11:08 AM  Social Worker: Press photographer, LCSW 11/04/2016 11:08 AM  Recreational Therapist:  11/04/2016 11:08 AM  Other:  11/04/2016 11:08 AM  Other:   11/04/2016 11:08 AM  Other: 11/04/2016 11:08 AM    Scribe for Treatment Team: [place], LCSW 11/04/2016 11:08 AM

## 2016-11-04 NOTE — [facility] Suicide Risk Assessment (Signed)
[place] INPATIENT:  Family/Significant Other Suicide Prevention Education  Suicide Prevention Education:  Patient Refusal for Family/Significant Other Suicide Prevention Education: The patient [name] has refused to provide written consent for family/significant other to be provided Family/Significant Other Suicide Prevention Education during admission and/or prior to discharge.  Physician notified.  SPE completed with pt, as pt refused to consent to family contact. SPI pamphlet provided to pt and pt was encouraged to share information with support network, ask questions, and talk about any concerns relating to SPE. Pt denies access to guns/firearms and verbalized understanding of information provided. Mobile Crisis information also provided to pt.   Argyle Gustafson N Smart LCSW 11/04/2016, 1:34 PM

## 2016-11-04 NOTE — Discharge Summary (Signed)
Physician Discharge Summary Note  Patient:  [name] is an 40 y.o., female MRN:  [id number] DOB:  [birth year] Patient phone:  [phone] (home)  Patient address:   [place] [address],  Total Time spent with patient: 30 minutes  Date of Admission:  11/03/2016 Date of Discharge: 11/08/2016  Reason for Admission:    Principal Problem: Adjustment disorder with mixed anxiety and depressed mood Discharge Diagnoses: Patient Active Problem List   Diagnosis Date Noted  . Adjustment disorder with mixed anxiety and depressed mood [F43.23] 11/04/2016  . History of medication noncompliance [Z91.14] 11/04/2016  . Drug overdose [T50.901A] 10/30/2016  . Polysubstance overdose [T50.901A] 10/29/2016  . Unilateral primary osteoarthritis, right knee [M17.11] 10/22/2016  . Diabetic neuropathy ([place]) [E11.40] 07/14/2016  . De Quervain's tenosynovitis, bilateral [M65.4] 11/01/2015  . Vitamin D deficiency [E55.9] 09/05/2015  . 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 Rectal itching [L29.0] 11/17/2014  . Restless leg syndrome [G25.81] 10/17/2014  . Chronic sinusitis [J32.9] 07/18/2014  . Headache [R51] 07/15/2014  . Vision, loss, sudden [H53.139] 07/12/2014  . Urge incontinence [N39.41] 10/15/2013  . Encounter for chronic pain management [G89.29] 06/30/2013  . Low back pain [M54.5] 01/31/2013  . HLD (hyperlipidemia) [E78.5] 11/19/2012  . Chest pain [R07.9] 06/27/2012  . Right carpal tunnel syndrome [G56.01] 09/01/2011  . Bilateral knee pain [M25.561, M25.562] 09/01/2011  . Insulin dependent diabetes mellitus ([place]) [E11.9, Z79.4] 05/22/2008  . Morbid obesity ([place]) [E66.01] 05/22/2008  . OBESITY HYPOVENTILATION SYNDROME [E67.8] 05/22/2008  . DEPRESSION [F32.9] 05/22/2008  . Obstructive sleep apnea [G47.33] 05/22/2008  . Essential hypertension [I10]  05/22/2008  . ASTHMA [J45.909] 05/22/2008  . GERD [K21.9] 05/22/2008    Past Psychiatric History: see HPI  Past Medical History:  Past Medical History:  Diagnosis Date  . Alveolar hypoventilation   . Anemia    not on iron pill  . Arthritis   . Asthma   . Bipolar 2 disorder ([place])   . Carpal tunnel syndrome on right    recurrent  . Cellulitis 08/2010-08/2011  . Chronic pain   . COPD (chronic obstructive pulmonary disease) ([name])    Symbicort daily and Proventil as needed  . Costochondritis   . Depression   . Diabetes mellitus 2000   Type 2, Uncontrolled.Takes Lantus daily.Fasting blood sugar runs 150  . Dizziness    occasionally  . Drug-seeking behavior   . GERD (gastroesophageal reflux disease)    takes Pantoprazole and Zantac daily  . Headache    migraine-last one about a yr ago.Topamax daily  . HLD (hyperlipidemia)    takes Atorvastatin daily  . Hypertension    takes Lisinopril and Coreg daily  . Morbid obesity ([place])   . Muscle spasm    takes Flexeril as needed  . Nocturia   . Obstructive sleep apnea   . Peripheral neuropathy ([name])    takes Gabapentin daily  . Pneumonia    "walking" several yrs ago and as a baby  . Rectal fissure   . Restless leg   . Urinary frequency   . Varicose veins    Right medial thigh and Left leg     Past Surgical History:  Procedure Laterality Date  . CARPAL TUNNEL RELEASE    . CESAREAN SECTION    . KNEE ARTHROSCOPY Right 07/17/2010  . LEFT HEART CATHETERIZATION WITH CORONARY ANGIOGRAM N/A 07/27/2012   Procedure: LEFT HEART  CATHETERIZATION WITH CORONARY ANGIOGRAM;  Surgeon: Sherren Mocha, MD;  Location: [facility] CATH LAB;  Service: Cardiovascular;  Laterality: N/A;  . left knee surgery     screws she thinks  . MASS EXCISION N/A 06/29/2013   Procedure:  WIDE LOCAL EXCISION OF POSTERIOR NECK ABSCESS;  Surgeon: Ralene Ok, MD;  Location: [facility];  Service: General;  Laterality: N/A;   Family History:  Family History  Problem Relation  Age of Onset  . Diabetes Mother   . Hyperlipidemia Mother   . Depression Mother   . Varicose Veins Mother   . Heart attack Paternal Uncle   . Heart disease Paternal Grandmother   . Heart attack Paternal Grandmother   . Heart attack Paternal Grandfather   . Heart disease Paternal Grandfather   . Heart attack Father    Family Psychiatric  History: see HPI Social History:  History  Alcohol Use No     History  Drug Use    Comment: OD attempts on home meds      Social History   Social History  . Marital status: Married    Spouse name: N/A  . Number of children: N/A  . Years of education: N/A   Social History Main Topics  . Smoking status: Former Smoker    Packs/day: 0.30    Years: 0.30    Types: Cigarettes    Quit date: 12/06/1993  . Smokeless tobacco: Never Used  . Alcohol use No  . Drug use:      Comment: OD attempts on home meds    . Sexual activity: Yes    Partners: Male   Other Topics Concern  . None   Social History Narrative   Lives in [place] with her fiance and 38 yr old dtr.    Hospital Course:  [name] 40 yo was admitted to medical floor 10/29/16 after taking an overdose of Flexeril. Patient denied chronic depressive symptoms or taking psychiatric medications as an outpatient.  She related that the issue that caused her to overdose was marital. She reported that there was marital discord and she was thinking that her husband was cheating on her.  [name] was admitted for Adjustment disorder with mixed anxiety and depressed mood and crisis management.  Patient was treated with medications with their indications listed below in detail under Medication List.  Medical problems were identified and treated as needed.  Home medications were restarted as appropriate.  Improvement was monitored by observation and [name] daily report of symptom reduction.  Emotional and mental status was monitored by daily self inventory reports completed by [name] and  clinical staff.  Patient reported continued improvement, denied any new concerns.  Patient had been compliant on medications and denied side effects.  Support and encouragement was provided.         [name] was evaluated by the treatment team for stability and plans for continued recovery upon discharge.  Patient was offered further treatment options upon discharge including Residential, Intensive Outpatient and Outpatient treatment. Patient will follow up with agency listed below for medication management and counseling.  Encouraged patient to maintain satisfactory support network and home environment.  Advised to adhere to medication compliance and outpatient treatment follow up.  Prescriptions provided.       [name] motivation was an integral factor for scheduling further treatment.  Employment, transportation, bed availability, health status, family support, and any pending legal issues were also considered during patient's hospital stay.  Upon completion of  this admission the patient was both mentally and medically stable for discharge denying suicidal/homicidal ideation, auditory/visual/tactile hallucinations, delusional thoughts and paranoia.      Physical Findings: AIMS:  , ,  ,  ,    CIWA:    COWS:     Musculoskeletal: Strength & Muscle Tone: within normal limits Gait & Station: normal Patient leans: N/A  Psychiatric Specialty Exam:  SEE MD  SRA Physical Exam  ROS  Blood pressure 107/80, pulse (!) 107, temperature 98.1 F (36.7 C), temperature source Oral, resp. rate 18, height '5\' 2"'  (1.575 m), weight 135.2 kg (298 lb), SpO2 100 %.Body mass index is 54.5 kg/m.      Has this patient used any form of tobacco in the last 30 days? (Cigarettes, Smokeless Tobacco, Cigars, and/or Pipes) Yes, N/A  Blood Alcohol level:  Lab Results  Component Value Date   ETH <5 10/05/5944    Metabolic Disorder Labs:  Lab Results  Component Value Date   HGBA1C 13.1 09/27/2016   MPG 329  07/13/2016   MPG 315 (H) 01/24/2015   No results found for: PROLACTIN Lab Results  Component Value Date   CHOL 250 (H) 11/15/2014   TRIG 362.0 (H) 11/15/2014   HDL 36.90 (L) 11/15/2014   CHOLHDL 7 11/15/2014   VLDL 72.4 (H) 11/15/2014   LDLCALC 195 (H) 08/25/2012    See Psychiatric Specialty Exam and Suicide Risk Assessment completed by Attending Physician prior to discharge.  Discharge destination:  Home  Is patient on multiple antipsychotic therapies at discharge:  No   Has Patient had three or more failed trials of antipsychotic monotherapy by history:  No  Recommended Plan for Multiple Antipsychotic Therapies: NA     Medication List    STOP taking these medications   ACCU-CHEK NANO SMARTVIEW w/Device Kit   Albiglutide 30 MG Pen Commonly known as:  TANZEUM   budesonide-formoterol 160-4.5 MCG/ACT inhaler Commonly known as:  SYMBICORT   cetirizine 10 MG tablet Commonly known as:  ZYRTEC   cyclobenzaprine 10 MG tablet Commonly known as:  FLEXERIL   diclofenac 75 MG EC tablet Commonly known as:  VOLTAREN   fluticasone 50 MCG/ACT nasal spray Commonly known as:  FLONASE   gabapentin 600 MG tablet Commonly known as:  NEURONTIN Replaced by:  gabapentin 300 MG capsule   glucose blood test strip Commonly known as:  ACCU-CHEK SMARTVIEW   HYDROcodone-acetaminophen 10-325 MG tablet Commonly known as:  NORCO   Insulin Glargine 100 UNIT/ML Solostar Pen Commonly known as:  LANTUS SOLOSTAR   nitroGLYCERIN 0.4 MG SL tablet Commonly known as:  NITROSTAT   pantoprazole 40 MG tablet Commonly known as:  PROTONIX   pramipexole 0.125 MG tablet Commonly known as:  MIRAPEX   PROVENTIL HFA 108 (90 Base) MCG/ACT inhaler Generic drug:  albuterol   ranitidine 150 MG tablet Commonly known as:  ZANTAC   topiramate 25 MG tablet Commonly known as:  TOPAMAX     TAKE these medications     Indication  atorvastatin 20 MG tablet Commonly known as:  LIPITOR Take 1  tablet (20 mg total) by mouth daily. Start taking on:  11/05/2016  Indication:  Elevation of Both Cholesterol and Triglycerides in Blood   carvedilol 3.125 MG tablet Commonly known as:  COREG Take 1 tablet (3.125 mg total) by mouth 2 (two) times daily with a meal. What changed:  medication strength  how much to take  how to take this  when to take this  additional instructions  Indication:  High Blood Pressure of Unknown Cause   gabapentin 300 MG capsule Commonly known as:  NEURONTIN Take 2 capsules (600 mg total) by mouth 3 (three) times daily. Replaces:  gabapentin 600 MG tablet  Indication:  Neuropathic Pain   hydrOXYzine 25 MG tablet Commonly known as:  ATARAX/VISTARIL Take 1 tablet (25 mg total) by mouth every 6 (six) hours as needed for anxiety.  Indication:  Anxiety Neurosis   lisinopril 5 MG tablet Commonly known as:  PRINIVIL,ZESTRIL Take 1 tablet (5 mg total) by mouth daily. Start taking on:  11/05/2016 What changed:  medication strength  how much to take  Indication:  High Blood Pressure Disorder   traZODone 50 MG tablet Commonly known as:  DESYREL Take 1 tablet (50 mg total) by mouth at bedtime as needed for sleep.  Indication:  Trouble Sleeping      Follow-up Information    Marjie Skiff [facility] Follow up on 11/05/2016.   Why:  Please contact Cheryl at discharge. She will come to your home for therapy session on day of discharge. Thank you.  Contact information: [address], [place] [address] Phone: [phone] Fax: [phone]       Patient is not taking mental health medications-no psychiatry referral necessary. Follow up.           Follow-up recommendations:  Activity:  as tol Diet:  as tol  Comments:  1.  Take all your medications as prescribed.   2.  Report any adverse side effects to outpatient provider. 3.  Patient instructed to not use alcohol or illegal drugs while on prescription medicines. 4.  In the event  of worsening symptoms, instructed patient to call 911, the crisis hotline or go to nearest emergency room for evaluation of symptoms.  Signed: Janett Labella, NP [facility] 11/04/2016, 3:10 PM

## 2016-11-04 NOTE — Progress Notes (Signed)
Patient ID: [name], female   DOB: [birth year], 40 y.o.   MRN: [id number]  DAR: Pt. Denies HI and A/V Hallucinations. She reports passive SI but is able to contract for safety. No current plan. She reports sleep is poor, appetite is fair, energy level is normal, and concentration is good. She rates depression 1/10, hopelessness 0/10, and anxiety 0/10. Patient does report a headache and received PRN medication for this. Support and encouragement provided to the patient. Scheduled medications administered to patient per physician's orders. Patient is minimal with Probation officer but does interact with peers. She is seen attending groups and is playing cards with peers. Q15 minute checks are maintained for safety.

## 2016-11-04 NOTE — [facility] Suicide Risk Assessment (Signed)
[facility] Admission Suicide Risk Assessment   Nursing information obtained from:    Demographic factors:    Current Mental Status:    Loss Factors:    Historical Factors:    Risk Reduction Factors:     Total Time spent with patient: 20 minutes Principal Problem: Adjustment disorder with mixed anxiety and depressed mood Diagnosis:   Patient Active Problem List   Diagnosis Date Noted  . Adjustment disorder with mixed anxiety and depressed mood [F43.23] 11/04/2016  . History of medication noncompliance [Z91.14] 11/04/2016  . Drug overdose [T50.901A] 10/30/2016  . Polysubstance overdose [T50.901A] 10/29/2016  . Unilateral primary osteoarthritis, right knee [M17.11] 10/22/2016  . Diabetic neuropathy ([place]) [E11.40] 07/14/2016  . De Quervain's tenosynovitis, bilateral [M65.4] 11/01/2015  . Vitamin D deficiency [E55.9] 09/05/2015  . 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 Varicose veins of leg with complications [id number]  . 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 Rectal itching [L29.0] 11/17/2014  . Restless leg syndrome [G25.81] 10/17/2014  . Chronic sinusitis [J32.9] 07/18/2014  . Headache [R51] 07/15/2014  . Vision, loss, sudden [H53.139] 07/12/2014  . Urge incontinence [N39.41] 10/15/2013  . Encounter for chronic pain management [G89.29] 06/30/2013  . Low back pain [M54.5] 01/31/2013  . HLD (hyperlipidemia) [E78.5] 11/19/2012  . Chest pain [R07.9] 06/27/2012  . Right carpal tunnel syndrome [G56.01] 09/01/2011  . Bilateral knee pain [M25.561, M25.562] 09/01/2011  . Insulin dependent diabetes mellitus ([place]) [E11.9, Z79.4] 05/22/2008  . Morbid obesity ([place]) [E66.01] 05/22/2008  . OBESITY HYPOVENTILATION SYNDROME [E67.8] 05/22/2008  . DEPRESSION [F32.9] 05/22/2008  . Obstructive sleep apnea [G47.33] 05/22/2008  . Essential hypertension [I10] 05/22/2008  . ASTHMA [J45.909] 05/22/2008  . GERD [K21.9] 05/22/2008   Subjective Data: Patient denies  current suicidal or homicidal ideation, plan or intent.  Continued Clinical Symptoms:  Alcohol Use Disorder Identification Test Final Score (AUDIT): 0 The "Alcohol Use Disorders Identification Test", Guidelines for Use in Primary Care, Second Edition.  World Pharmacologist [facility]). Score between 0-7:  no or low risk or alcohol related problems. Score between 8-15:  moderate risk of alcohol related problems. Score between 16-19:  high risk of alcohol related problems. Score 20 or above:  warrants further diagnostic evaluation for alcohol dependence and treatment.   CLINICAL FACTORS:   Medical Diagnoses and Treatments/Surgeries   Musculoskeletal: Strength & Muscle Tone: within normal limits Gait & Station: normal Patient leans: N/A  Psychiatric Specialty Exam: Physical Exam  ROS  Blood pressure 114/76, pulse (!) 103, temperature 98.1 F (36.7 C), temperature source Oral, resp. rate 18, height 5\' 2"  (1.575 m), weight 135.2 kg (298 lb), SpO2 100 %.Body mass index is 54.5 kg/m.   General Appearance: Casual  Eye Contact:  Good  Speech:  Clear and Coherent  Volume:  Normal  Mood:  Anxious  Affect:  Appropriate and Congruent  Thought Process:  Coherent  Orientation:  Full (Time, Place, and Person)  Thought Content:  Negative  Suicidal Thoughts:  No  Homicidal Thoughts:  No  Memory:  Negative  Judgement:  Fair  Insight:  Fair  Psychomotor Activity:  Normal  Concentration:  Concentration: Good  Recall:  Good  Fund of Knowledge:  Good  Language:  Good  Akathisia:  No  Handed:  Right  AIMS (if indicated):     Assets:  Resilience  ADL's:  Intact  Cognition:  WNL  Sleep:  Number of Hours: 2     COGNITIVE FEATURES THAT CONTRIBUTE TO RISK:  None  SUICIDE RISK:   Minimal: No identifiable suicidal ideation.  Patients presenting with no risk factors but with morbid ruminations; may be classified as minimal risk based on the severity of the depressive symptoms   PLAN  OF CARE: See PAA  I certify that inpatient services furnished can reasonably be expected to improve the patient's condition.  Linard Millers, MD 11/04/2016, 10:16 AM

## 2016-11-04 NOTE — [facility] Suicide Risk Assessment (Signed)
[facility] Discharge Suicide Risk Assessment   Principal Problem: Adjustment disorder with mixed anxiety and depressed mood Discharge Diagnoses:  Patient Active Problem List   Diagnosis Date Noted  . Adjustment disorder with mixed anxiety and depressed mood [F43.23] 11/04/2016  . History of medication noncompliance [Z91.14] 11/04/2016  . Drug overdose [T50.901A] 10/30/2016  . Polysubstance overdose [T50.901A] 10/29/2016  . Unilateral primary osteoarthritis, right knee [M17.11] 10/22/2016  . Diabetic neuropathy ([place]) [E11.40] 07/14/2016  . De Quervain's tenosynovitis, bilateral [M65.4] 11/01/2015  . Vitamin D deficiency [E55.9] 09/05/2015  . 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 Varicose veins of leg with complications [id number]  . 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 Rectal itching [L29.0] 11/17/2014  . Restless leg syndrome [G25.81] 10/17/2014  . Chronic sinusitis [J32.9] 07/18/2014  . Headache [R51] 07/15/2014  . Vision, loss, sudden [H53.139] 07/12/2014  . Urge incontinence [N39.41] 10/15/2013  . Encounter for chronic pain management [G89.29] 06/30/2013  . Low back pain [M54.5] 01/31/2013  . HLD (hyperlipidemia) [E78.5] 11/19/2012  . Chest pain [R07.9] 06/27/2012  . Right carpal tunnel syndrome [G56.01] 09/01/2011  . Bilateral knee pain [M25.561, M25.562] 09/01/2011  . Insulin dependent diabetes mellitus ([place]) [E11.9, Z79.4] 05/22/2008  . Morbid obesity ([place]) [E66.01] 05/22/2008  . OBESITY HYPOVENTILATION SYNDROME [E67.8] 05/22/2008  . DEPRESSION [F32.9] 05/22/2008  . Obstructive sleep apnea [G47.33] 05/22/2008  . Essential hypertension [I10] 05/22/2008  . ASTHMA [J45.909] 05/22/2008  . GERD [K21.9] 05/22/2008    Total Time spent with patient: 15 minutes  Musculoskeletal: Strength & Muscle Tone: within normal limits Gait & Station: normal Patient leans: N/A  Psychiatric Specialty Exam: ROS  Blood pressure 107/80, pulse (!) 107,  temperature 98.1 F (36.7 C), temperature source Oral, resp. rate 18, height 5\' 2"  (1.575 m), weight 135.2 kg (298 lb), SpO2 100 %.Body mass index is 54.5 kg/m.  General Appearance: Casual  Eye Contact::  Good  Speech:  Clear and Coherent  Volume:  Normal  Mood:  Euthymic  Affect:  Appropriate  Thought Process:  Coherent  Orientation:  Negative  Thought Content:  Negative  Suicidal Thoughts:  No  Homicidal Thoughts:  No  Memory:  Negative  Judgement:  Fair  Insight:  Fair  Psychomotor Activity:  Negative  Concentration:  Good  Recall:  Good  Fund of Knowledge:Good  Language: Good  Akathisia:  No  Handed:  Right  AIMS (if indicated):     Assets:  Resilience    Cognition: WNL  ADL's:  Intact   Mental Status Per Nursing Assessment::   On Admission:     Demographic Factors:  Caucasian  Loss Factors: Decline in physical health  Historical Factors: Prior suicide attempts  Risk Reduction Factors:   Sense of responsibility to family and Living with another person, especially a relative  Continued Clinical Symptoms:  Medical Diagnoses and Treatments/Surgeries  Cognitive Features That Contribute To Risk:  None    Suicide Risk:  Mild:  Suicidal ideation of limited frequency, intensity, duration, and specificity.  There are no identifiable plans, no associated intent, mild dysphoria and related symptoms, good self-control (both objective and subjective assessment), few other risk factors, and identifiable protective factors, including available and accessible social support.  Follow-up Information    Marjie Skiff [facility] Follow up on 11/05/2016.   Why:  Please contact Cheryl at discharge. She will come to your home for therapy session on day of discharge. Thank you.  Contact information: [id number]  [address] Phone: [phone] Fax:  [phone]       Patient is not taking mental health medications-no psychiatry referral necessary. Follow  up.           Plan Of Care/Follow-up recommendations:  Other:  Patient denies homicidal or suicidal ideation, plan or intent at time of discharge. Patient should keep follow-up appointments as needed for mental health and her physical issues after discharge.  Linard Millers, MD 11/04/2016, 3:07 PM

## 2016-11-04 NOTE — Progress Notes (Signed)
Alford Highland had been up and visible in milieu this evening, did attend evening group activity and was seen interacting appropriately with fellow peers. Kimmerly spoke about how she had overdosed on flexeril and how she was on a medical floor for a few days before coming to [facility]. Cheyanna has appeared depressed this evening and spoke about how she hopes to leave soon as she misses being with her family. Marilea did receive bedtime medications without incident. A. Support provided, medication education given. RAniceto Boss verbalized understanding, safety maintained.

## 2016-11-04 NOTE — Progress Notes (Signed)
[place] Group Notes:  (Nursing/MHT/Case Management/Adjunct)  Date:  11/04/2016  Time:  10:54 PM  Type of Therapy:  Psychoeducational Skills  Participation Level:  Active  Participation Quality:  Appropriate  Affect:  Appropriate  Cognitive:  Appropriate  Insight:  Good  Engagement in Group:  Engaged  Modes of Intervention:  Education  Summary of Progress/Problems: The patient expressed in group that she had a good day overall and that she enjoyed going outside for fresh air. She anticipates being discharged tomorrow. Her goal for tomorrow is to get discharged.   Archie Balboa S 11/04/2016, 10:54 PM
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
## 2016-11-04 NOTE — [facility] Group Notes (Signed)
[place] LCSW Group Therapy  11/04/2016 1:41 PM  Type of Therapy:  Group Therapy  Participation Level:  Active  Participation Quality:  Attentive  Affect:  Appropriate  Cognitive:  Alert and Oriented  Insight:  Improving  Engagement in Therapy:  Improving  Modes of Intervention:  Discussion, Education, Exploration, Problem-solving, Rapport Building, Socialization and Support  Summary of Progress/Problems: [facility] Speaker came to talk about his personal journey with substance abuse and addiction. The pt processed ways by which to relate to the speaker. [place] speaker provided handouts and educational information pertaining to groups and services offered by the [facility].   Yanel Dombrosky N Smart LCSW 11/04/2016, 1:41 PM

## 2016-11-04 NOTE — Progress Notes (Signed)
Nursing Progress Note: 7p-7a D: Pt currently presents with a flat affect and appropriate behavior for situations. Pt reports to writer that their goal is to "get more rest." Pt states "I feel a lot better than yesterday." Pt reports good sleep with current medication regimen.   A: Pt provided with medications per providers orders. Pt's labs and vitals were monitored throughout the night. Pt supported emotionally and encouraged to express concerns and questions. Pt educated on medications.  R: Pt's safety ensured with 15 minute and environmental checks. Pt currently denies SI/HI/Self Harm and A/V hallucinations. Pt verbally agrees to seek staff if SI/HI or A/VH occurs and to consult with staff before acting on any harmful thoughts. Will continue POC.

## 2016-11-04 NOTE — B[facility] Group Notes (Signed)
[place] Group Notes:  (Nursing/MHT/Case Management/Adjunct)  Date:  11/04/2016  Time:  0900  Type of Therapy:  Nurse education  Participation Level:  Active  Participation Quality:  Appropriate, Sharing and Supportive  Affect:  Appropriate  Cognitive:  Alert and Oriented  Insight:  Appropriate  Engagement in Group:  Developing/Improving  Modes of Intervention:  Activity, Discussion, Education, Socialization and Support  Summary of Progress/Problems:The goal of this group was to identify a goal for today and educate on the benefits of aromatherapy. Pt reports that her goal is to go to see her therapist following discharge tomorrow. Pt plans to see her once or twice a week as she deals with marital conflicts.  Mosie Lukes 11/04/2016, 5:51 PM

## 2016-11-05 LAB — GLUCOSE, CAPILLARY: Glucose-Capillary: 242 mg/dL — ABNORMAL HIGH (ref 65–99)

## 2016-11-05 NOTE — Progress Notes (Signed)
Patient discharged to lobby. Patient was stable and appreciative at that time. All papers and prescriptions were given and valuables returned. Verbal understanding expressed. Denies SI/HI and A/VH. Patient given opportunity to express concerns and ask questions.  

## 2016-11-05 NOTE — Progress Notes (Addendum)
  [facility] Adult Case Management Discharge Plan :  Will you be returning to the same living situation after discharge:  Yes,  home At discharge, do you have transportation home?: Yes,  Family member will pick her up.  Do you have the ability to pay for your medications: Yes,  [facility] Medicaid  Release of information consent forms completed and submitted to medical records by CSW.  Patient to Follow up at: Follow-up Information    [facility] Follow up on 11/05/2016.   Why:  Please contact Cheryl at discharge. She will come to your home for therapy session on day of discharge. Thank you.  Contact information: [id number], [place] [address] Phone: [phone] Fax: [phone]       Patient is not taking mental health medications-no psychiatry referral necessary. Follow up.           Next level of care provider has access to [facility] and Suicide Prevention discussed: Yes,  SPE completed with pt; pt declined to consent to family contact.   Has patient been referred to the Quitline?: N/A patient is not a smoker  Patient has been referred for addiction treatment: Yes  Kimber Relic Smart LCSW 11/05/2016, 8:34 AM

## 2016-11-05 NOTE — Progress Notes (Signed)
Adult Psychoeducational Group Note  Date:  11/05/2016 Time:  10:30 AM  Group Topic/Focus:  Goals Group:   The focus of this group is to help patients establish daily goals to achieve during treatment and discuss how the patient can incorporate goal setting into their daily lives to aide in recovery.   Participation Level:  Active  Participation Quality:  Appropriate  Affect:  Appropriate  Cognitive:  Appropriate  Insight: Appropriate  Engagement in Group:  Engaged  Modes of Intervention:  Discussion  Additional Comments:  Pt states that she is leaving today, and would like to continue outpatient treatment and get on some medication. Maybree Riling R Karsyn Jamie 11/05/2016, 10:30 AM

## 2016-11-05 NOTE — Progress Notes (Signed)
Recreation Therapy Notes  Date: 11/05/16 Time: 0930 Location: [address] Dayroom  Group Topic: Stress Management  Goal Area(s) Addresses:  Patient will verbalize importance of using healthy stress management.  Patient will identify positive emotions associated with healthy stress management.   Behavioral Response: Engaged  Intervention: Stress Management  Activity :  Guided Imagery.  LRT introduced the stress management technique of guided imagery.  LRT read a script so patients could experience the concept of guided imagery.  Patients were to follow along as LRT read script to engage in activity.  Education:  Stress Management, Discharge Planning.   Education Outcome: Acknowledges edcuation/In group clarification offered/Needs additional education  Clinical Observations/Feedback: Pt attended group.   Victorino Sparrow, LRT/CTRS         Victorino Sparrow A 11/05/2016 11:41 AM

## 2016-11-05 NOTE — Tx Team (Signed)
Interdisciplinary Treatment and Diagnostic Plan Update  11/05/2016 Time of Session: 9:30AM [name] MRN: [id number]  Principal Diagnosis: Adjustment disorder with mixed anxiety and depressed mood  Secondary Diagnoses: Principal Problem:   Adjustment disorder with mixed anxiety and depressed mood Active Problems:   History of medication noncompliance   Current Medications:  Current Facility-Administered Medications  Medication Dose Route Frequency Provider Last Rate Last Dose  . acetaminophen (TYLENOL) tablet 650 mg  650 mg Oral Q6H PRN Ethelene Hal, NP   650 mg at 11/04/16 0859  . alum & mag hydroxide-simeth (MAALOX/MYLANTA) 200-200-20 MG/5ML suspension 30 mL  30 mL Oral Q4H PRN Ethelene Hal, NP      . atorvastatin (LIPITOR) tablet 20 mg  20 mg Oral Daily Ethelene Hal, NP   20 mg at 11/04/16 0810  . carvedilol (COREG) tablet 3.125 mg  3.125 mg Oral BID WC Ethelene Hal, NP   3.125 mg at 11/04/16 1728  . famotidine (PEPCID) tablet 20 mg  20 mg Oral BID Ethelene Hal, NP   20 mg at 11/04/16 1728  . gabapentin (NEURONTIN) capsule 600 mg  600 mg Oral TID Linard Millers, MD   600 mg at 11/04/16 1728  . hydrOXYzine (ATARAX/VISTARIL) tablet 25 mg  25 mg Oral Q6H PRN Ethelene Hal, NP   25 mg at 11/04/16 0109  . insulin aspart (novoLOG) injection 0-15 Units  0-15 Units Subcutaneous TID WC Ethelene Hal, NP   5 Units at 11/05/16 [phone]  . insulin detemir (LEVEMIR) injection 15 Units  15 Units Subcutaneous QHS Ethelene Hal, NP   15 Units at 11/04/16 2158  . lisinopril (PRINIVIL,ZESTRIL) tablet 5 mg  5 mg Oral Daily Ethelene Hal, NP   5 mg at 11/04/16 0810  . magnesium hydroxide (MILK OF MAGNESIA) suspension 30 mL  30 mL Oral Daily PRN Ethelene Hal, NP      . ondansetron [facility]) injection 4 mg  4 mg Intravenous Q6H PRN Ethelene Hal, NP      . traZODone (DESYREL) tablet 50 mg  50 mg Oral QHS PRN Ethelene Hal, NP   50 mg at 11/04/16 2201   PTA Medications: Prescriptions Prior to Admission  Medication Sig Dispense Refill Last Dose  . Albiglutide (TANZEUM) 30 MG PEN Inject 30 mg into the skin once a week. 4 each 11 Past Week at Unknown time  . atorvastatin (LIPITOR) 20 MG tablet Take 1 tablet (20 mg total) by mouth daily. 90 tablet 3 10/29/2016 at Unknown time  . budesonide-formoterol (SYMBICORT) 160-4.5 MCG/ACT inhaler INHALE 2 PUFFS INTO LUNGS TWICE A DAY 10.2 g 5 Past Week at Unknown time  . carvedilol (COREG) 12.5 MG tablet TAKE 1 TABLET BY MOUTH TWICE DAILY WITH A MEAL 60 tablet 5 10/29/2016 at pm  . cetirizine (ZYRTEC) 10 MG tablet TAKE 1 TABLET BY MOUTH DAILY (Patient taking differently: TAKE 10 MG BY MOUTH DAILY AS NEEDED FOR ALLERGIES) 30 tablet 11 Past Week at Unknown time  . cyclobenzaprine (FLEXERIL) 10 MG tablet Take 1 tablet (10 mg total) by mouth daily as needed for muscle spasms. 30 tablet 3 10/29/2016 at Unknown time  . fluticasone (FLONASE) 50 MCG/ACT nasal spray Place 2 sprays into both nostrils daily. 16 g 6 Past Month at Unknown time  . gabapentin (NEURONTIN) 600 MG tablet Take 2 tablets (1,200 mg total) by mouth 3 (three) times daily. 180 tablet 0 10/29/2016 at Unknown time  . HYDROcodone-acetaminophen ([place])  10-325 MG tablet Take 1 tablet by mouth every 8 (eight) hours as needed for severe pain. 90 tablet 0 10/29/2016 at Unknown time  . Insulin Glargine (LANTUS SOLOSTAR) 100 UNIT/ML Solostar Pen Inject 700 Units into the skin every morning. And pen needles 4/day 90 pen 11 10/29/2016 at Unknown time  . lisinopril (PRINIVIL,ZESTRIL) 10 MG tablet Take 1 tablet (10 mg total) by mouth daily. 30 tablet 5 10/29/2016 at pmtime  . nitroGLYCERIN (NITROSTAT) 0.4 MG SL tablet DISSOLVE 1 TABLET UNDER THE TONGUE EVERY 5 MINUTES AS NEEDED FOR CHEST PAIN. 30 tablet 1 Past Month at Unknown time  . pantoprazole (PROTONIX) 40 MG tablet TAKE 1 TABLET BY MOUTH EVERY DAY (Patient taking  differently: TAKE 40 MG BY MOUTH EVERY DAY) 90 tablet 3 10/29/2016 at Unknown time  . pramipexole (MIRAPEX) 0.125 MG tablet TAKE 3 TABLETS BY MOUTH EVERY NIGHT AT BEDTIME (Patient taking differently: TAKE 0.375 MG BY MOUTH EVERY NIGHT AT BEDTIME) 270 tablet 1 10/29/2016 at Unknown time  . PROVENTIL HFA 108 (90 BASE) MCG/ACT inhaler INHALE 1 PUFF BY MOUTH EVERY 6 HOURS AS NEEDED FOR WHEEZING OR SHORTNESS OF BREATH. 6.7 g 11 Past Week at Unknown time  . Blood Glucose Monitoring Suppl (ACCU-CHEK NANO SMARTVIEW) w/Device KIT 1 Device by Does not apply route once a week. 1 kit 0   . diclofenac (VOLTAREN) 75 MG EC tablet Take 1 tablet (75 mg total) by mouth 2 (two) times daily. (Patient not taking: Reported on 11/03/2016) 20 tablet 0 Not Taking at Unknown time  . glucose blood (ACCU-CHEK SMARTVIEW) test strip USE TO TEST BLOOD SUGAR FOUR TIMES DAILY, and lancets 4/day 100 each 11   . ranitidine (ZANTAC) 150 MG tablet Take 1 tablet (150 mg total) by mouth 2 (two) times daily. (Patient not taking: Reported on 11/03/2016) 60 tablet 1 Not Taking at Unknown time  . topiramate (TOPAMAX) 25 MG tablet Take 1 tablet (25 mg total) by mouth at bedtime. (Patient not taking: Reported on 11/03/2016) 30 tablet 3 Not Taking at Unknown time    Patient Stressors: Financial difficulties Health problems  Patient Strengths: Capable of independent living Curator fund of knowledge  Treatment Modalities: Medication Management, Group therapy, Case management,  1 to 1 session with clinician, Psychoeducation, Recreational therapy.   Physician Treatment Plan for Primary Diagnosis: Adjustment disorder with mixed anxiety and depressed mood Long Term Goal(s): Improvement in symptoms so as ready for discharge Improvement in symptoms so as ready for discharge   Short Term Goals: Ability to demonstrate self-control will improve Ability to identify and develop effective coping behaviors will  improve  Medication Management: Evaluate patient's response, side effects, and tolerance of medication regimen.  Therapeutic Interventions: 1 to 1 sessions, Unit Group sessions and Medication administration.  Evaluation of Outcomes: Met  Physician Treatment Plan for Secondary Diagnosis: Principal Problem:   Adjustment disorder with mixed anxiety and depressed mood Active Problems:   History of medication noncompliance  Long Term Goal(s): Improvement in symptoms so as ready for discharge Improvement in symptoms so as ready for discharge   Short Term Goals: Ability to demonstrate self-control will improve Ability to identify and develop effective coping behaviors will improve     Medication Management: Evaluate patient's response, side effects, and tolerance of medication regimen.  Therapeutic Interventions: 1 to 1 sessions, Unit Group sessions and Medication administration.  Evaluation of Outcomes: Met   RN Treatment Plan for Primary Diagnosis: Adjustment disorder with mixed anxiety and depressed mood Long Term  Goal(s): Knowledge of disease and therapeutic regimen to maintain health will improve  Short Term Goals: Ability to remain free from injury will improve, Ability to disclose and discuss suicidal ideas and Ability to identify and develop effective coping behaviors will improve  Medication Management: RN will administer medications as ordered by provider, will assess and evaluate patient's response and provide education to patient for prescribed medication. RN will report any adverse and/or side effects to prescribing provider.  Therapeutic Interventions: 1 on 1 counseling sessions, Psychoeducation, Medication administration, Evaluate responses to treatment, Monitor vital signs and CBGs as ordered, Perform/monitor CIWA, COWS, AIMS and Fall Risk screenings as ordered, Perform wound care treatments as ordered.  Evaluation of Outcomes: Met   LCSW Treatment Plan for Primary  Diagnosis: Adjustment disorder with mixed anxiety and depressed mood Long Term Goal(s): Safe transition to appropriate next level of care at discharge, Engage patient in therapeutic group addressing interpersonal concerns.  Short Term Goals: Engage patient in aftercare planning with referrals and resources, Facilitate patient progression through stages of change regarding substance use diagnoses and concerns and Identify triggers associated with mental health/substance abuse issues  Therapeutic Interventions: Assess for all discharge needs, 1 to 1 time with Social worker, Explore available resources and support systems, Assess for adequacy in community support network, Educate family and significant other(s) on suicide prevention, Complete Psychosocial Assessment, Interpersonal group therapy.  Evaluation of Outcomes: Met   Progress in Treatment: Attending groups: Yes. Participating in groups: Yes. Taking medication as prescribed: Yes. Toleration medication: Yes. Family/Significant other contact made: SPE completed with pt; pt declined to consent to family contact.  Patient understands diagnosis: Yes. Discussing patient identified problems/goals with staff: Yes. Medical problems stabilized or resolved: Yes. Denies suicidal/homicidal ideation: Yes. Self report  Issues/concerns per patient self-inventory: No. Other: n/a  New problem(s) identified: No, Describe:  n/a  New Short Term/Long Term Goal(s):  Discharge Plan or Barriers: Pt plans to return home and resume services with her current therapist. Pt is not open to medication management and does not need psychiatry referral.   Reason for Continuation of Hospitalization: none  Estimated Length of Stay: d/c today   Attendees: Patient: 11/05/2016 8:36 AM  Physician: Dr. Odelia Gage MD 11/05/2016 8:36 AM  Nursing: Benjamine Mola RN; Jan RN 11/05/2016 8:36 AM  RN Care Manager: Lars Pinks CM 11/05/2016 8:36 AM  Social Worker: Maxie Better, LCSW 11/05/2016 8:36 AM  Recreational Therapist:  11/05/2016 8:36 AM  Other: Agustina Caroli NP 11/05/2016 8:36 AM  Other:  11/05/2016 8:36 AM  Other: 11/05/2016 8:36 AM    Scribe for Treatment Team: Elizabeth, LCSW 11/05/2016 8:36 AM

## 2016-11-07 IMAGING — CR DG CHEST 2V
2 series · 2 of 2 positions shown · non-contrast
Comparison: 10/23/2014.

CLINICAL DATA: Chest pain.

EXAM:
CHEST  2 VIEW

[chest pa]
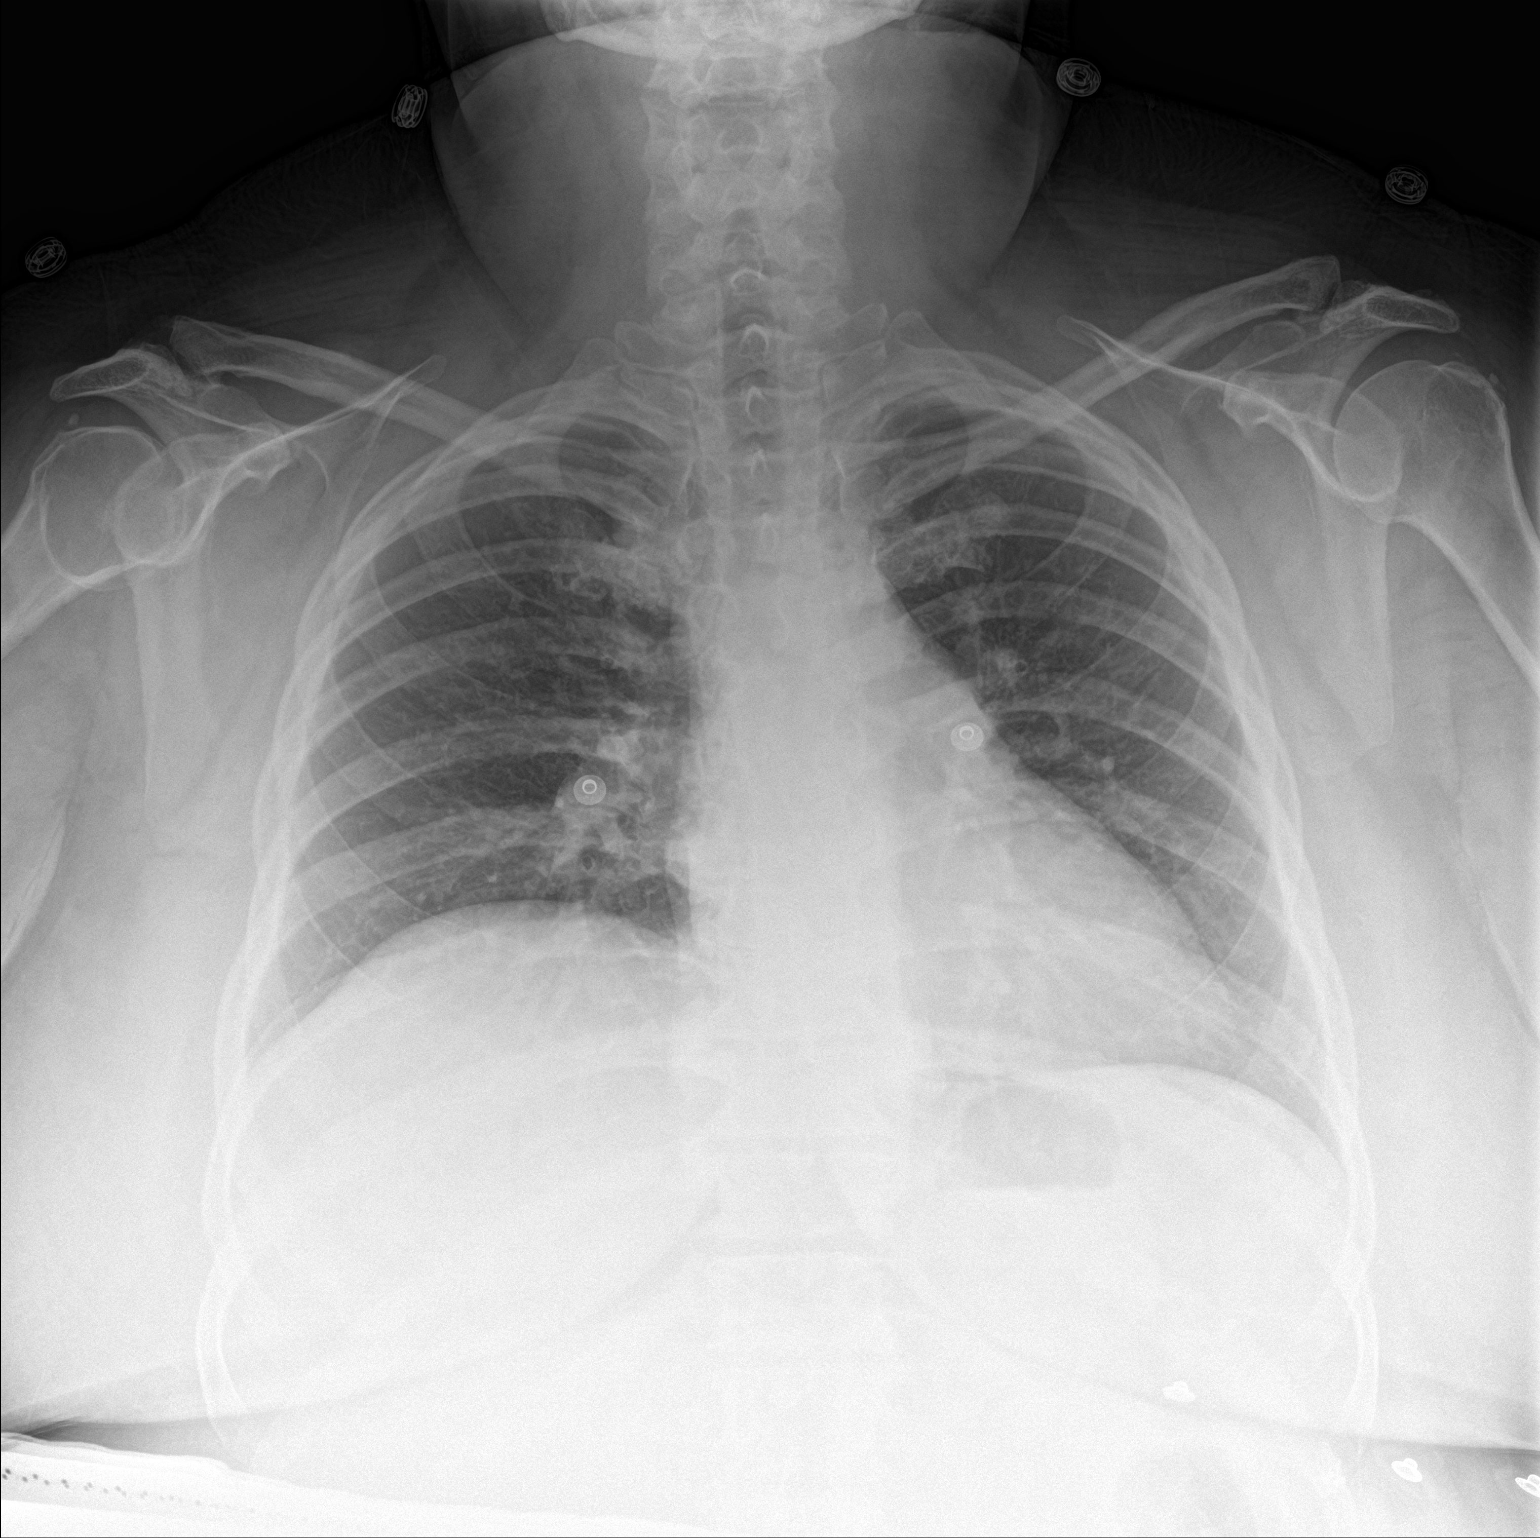

[chest lat]
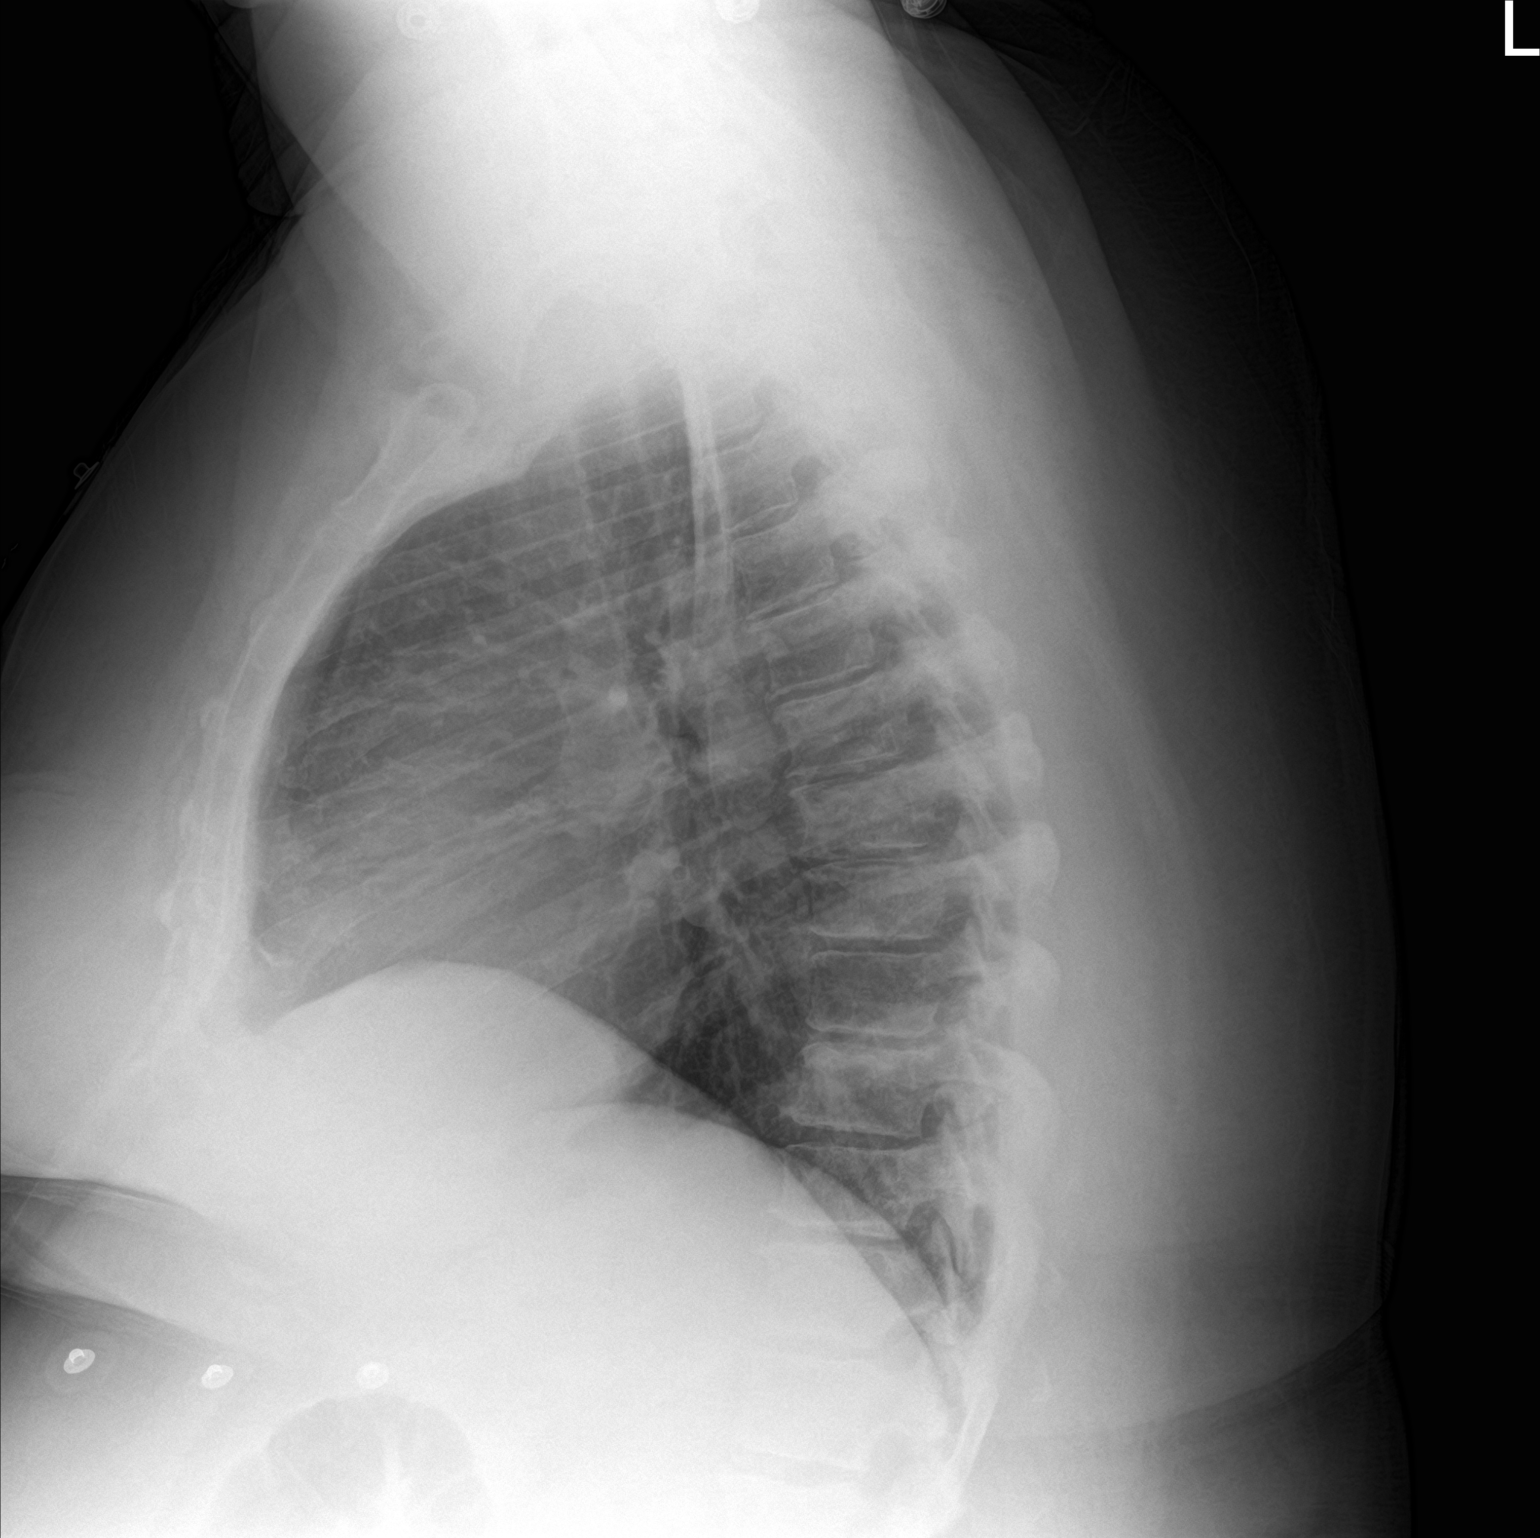

[2 of 2 positions shown; findings below may reference images not displayed]

FINDINGS: Poor inspiration. Normal sized heart. Clear lungs. Mild diffuse
peribronchial thickening. Mild thoracic spine degenerative changes.
IMPRESSION: Mild bronchitic changes.

## 2016-12-09 ENCOUNTER — Emergency Department (HOSPITAL_COMMUNITY)
Admission: EM | Admit: 2016-12-09 | Discharge: 2016-12-10 | Disposition: A | Discharge status: Home / Self Care | Payer: Medicaid Other | Attending: Emergency Medicine | Admitting: Emergency Medicine

## 2016-12-09 ENCOUNTER — Encounter (HOSPITAL_COMMUNITY): Payer: Self-pay | Admitting: Emergency Medicine

## 2016-12-09 DIAGNOSIS — R1030 Lower abdominal pain, unspecified: Secondary | ICD-10-CM

## 2016-12-09 DIAGNOSIS — N3001 Acute cystitis with hematuria: Secondary | ICD-10-CM | POA: Diagnosis not present

## 2016-12-09 DIAGNOSIS — B3731 Acute candidiasis of vulva and vagina: Secondary | ICD-10-CM

## 2016-12-09 DIAGNOSIS — B373 Candidiasis of vulva and vagina: Secondary | ICD-10-CM | POA: Insufficient documentation

## 2016-12-09 DIAGNOSIS — M545 Low back pain, unspecified: Secondary | ICD-10-CM

## 2016-12-09 DIAGNOSIS — Z794 Long term (current) use of insulin: Secondary | ICD-10-CM | POA: Diagnosis not present

## 2016-12-09 DIAGNOSIS — I10 Essential (primary) hypertension: Secondary | ICD-10-CM | POA: Insufficient documentation

## 2016-12-09 DIAGNOSIS — E1140 Type 2 diabetes mellitus with diabetic neuropathy, unspecified: Secondary | ICD-10-CM | POA: Insufficient documentation

## 2016-12-09 DIAGNOSIS — J449 Chronic obstructive pulmonary disease, unspecified: Secondary | ICD-10-CM | POA: Diagnosis not present

## 2016-12-09 DIAGNOSIS — Z87891 Personal history of nicotine dependence: Secondary | ICD-10-CM | POA: Diagnosis not present

## 2016-12-09 DIAGNOSIS — R102 Pelvic and perineal pain: Secondary | ICD-10-CM | POA: Diagnosis present

## 2016-12-09 LAB — COMPREHENSIVE METABOLIC PANEL
ALBUMIN: 3 g/dL — AB (ref 3.5–5.0)
ALK PHOS: 101 U/L (ref 38–126)
ALT: 19 U/L (ref 14–54)
AST: 18 U/L (ref 15–41)
Anion gap: 12 (ref 5–15)
BUN: 7 mg/dL (ref 6–20)
CALCIUM: 9 mg/dL (ref 8.9–10.3)
CHLORIDE: 95 mmol/L — AB (ref 101–111)
CO2: 27 mmol/L (ref 22–32)
CREATININE: 0.63 mg/dL (ref 0.44–1.00)
GFR calc Af Amer: >60 mL/min (ref >60)
GFR calc non Af Amer: >60 mL/min (ref >60)
GLUCOSE: 437 mg/dL — AB (ref 65–99)
Potassium: 4.2 mmol/L (ref 3.5–5.1)
SODIUM: 134 mmol/L — AB (ref 135–145)
Total Bilirubin: 0.5 mg/dL (ref 0.3–1.2)
Total Protein: 7.1 g/dL (ref 6.5–8.1)

## 2016-12-09 LAB — URINALYSIS, ROUTINE W REFLEX MICROSCOPIC
Bilirubin Urine: NEGATIVE
Ketones, ur: 20 mg/dL — AB
Nitrite: NEGATIVE
PROTEIN: 100 mg/dL — AB
SPECIFIC GRAVITY, URINE: 1.029 (ref 1.005–1.030)
pH: 5 (ref 5.0–8.0)

## 2016-12-09 LAB — CBC
HCT: 41.3 % (ref 36.0–46.0)
Hemoglobin: 13.8 g/dL (ref 12.0–15.0)
MCH: 29.9 pg (ref 26.0–34.0)
MCHC: 33.4 g/dL (ref 30.0–36.0)
MCV: 89.4 fL (ref 78.0–100.0)
PLATELETS: 298 10*3/uL (ref 150–400)
RBC: 4.62 MIL/uL (ref 3.87–5.11)
RDW: 12.6 % (ref 11.5–15.5)
WBC: 8.2 10*3/uL (ref 4.0–10.5)

## 2016-12-09 LAB — I-STAT BETA HCG BLOOD, ED (MC, WL, AP ONLY)

## 2016-12-09 LAB — LIPASE, BLOOD: LIPASE: 23 U/L (ref 11–51)

## 2016-12-09 NOTE — ED Triage Notes (Signed)
cbg 125 

## 2016-12-09 NOTE — ED Triage Notes (Signed)
Per ems, pt c/o pelvic pain this morning, also c/o bilateral flank pain. 170/104, HR 110.

## 2016-12-10 ENCOUNTER — Emergency Department (HOSPITAL_COMMUNITY): Payer: Medicaid Other

## 2016-12-10 LAB — WET PREP, GENITAL
Sperm: NONE SEEN
Trich, Wet Prep: NONE SEEN

## 2016-12-10 MED ORDER — IOPAMIDOL (ISOVUE-300) INJECTION 61%
INTRAVENOUS | Status: AC
  Administered 2016-12-10: 100 mL
  Filled 2016-12-10: qty 100

## 2016-12-10 MED ORDER — ONDANSETRON HCL 4 MG/2ML IJ SOLN
4.0000 mg | Freq: Once | INTRAMUSCULAR | Status: AC
  Administered 2016-12-10: 4 mg via INTRAVENOUS
  Filled 2016-12-10: qty 2

## 2016-12-10 MED ORDER — CEPHALEXIN 500 MG PO CAPS: 500.0000 mg | ORAL_CAPSULE | Freq: Four times a day (QID) | ORAL | 0 refills | Status: DC

## 2016-12-10 MED ORDER — FLUCONAZOLE 100 MG PO TABS
200.0000 mg | ORAL_TABLET | Freq: Once | ORAL | Status: AC
  Administered 2016-12-10: 200 mg via ORAL
  Filled 2016-12-10: qty 2

## 2016-12-10 MED ORDER — MORPHINE SULFATE (PF) 4 MG/ML IV SOLN
4.0000 mg | Freq: Once | INTRAVENOUS | Status: AC
  Administered 2016-12-10: 4 mg via INTRAVENOUS
  Filled 2016-12-10: qty 1

## 2016-12-10 MED ORDER — SODIUM CHLORIDE 0.9 % IV BOLUS (SEPSIS)
1000.0000 mL | Freq: Once | INTRAVENOUS | Status: AC
  Administered 2016-12-10: 1000 mL via INTRAVENOUS

## 2016-12-10 NOTE — Discharge Instructions (Signed)
1. Medications: Keflex, usual home medications °2. Treatment: rest, drink plenty of fluids, take medications as prescribed °3. Follow Up: Please followup with your primary doctor in 3 days for discussion of your diagnoses and further evaluation after today's visit; if you do not have a primary care doctor use the resource guide provided to find one; return to the ER for fevers, persistent vomiting, worsening abdominal pain or other concerning symptoms. ° °

## 2016-12-10 NOTE — ED Provider Notes (Signed)
[facility] DEPT Provider Note   CSN: [id number] Arrival date & time: 12/09/16  2051     History   Chief Complaint Chief Complaint  Patient presents with  . Flank Pain  . Pelvic Pain    HPI [name] is a 41 y.o. female with a hx of Hypoventilation, obesity, bipolar disorder, chronic pain, COPD, insulin dependent diabetes, GERD, hypertension, sleep apnea, restless leg syndrome, urinary frequency presents to the Emergency Department complaining of gradual, persistent, progressively worsening pelvic and low back pain onset 11am yesterday.  She denies vaginal bleeding/discharge.  She reports urinary frequency but denies urgency, hematuria or dysuria. She is sexually active with 1 long term female partner.  Last ST was 10 years ago and treated.  Pt has taken ibuprofen without relief.  She reports pain is sharp and nonradiating.  She reports pelvic pain is more like pressure.  Walking makes it worse and nothing makes it better. Pt denies fever, chills, nausea, vomiting, diarrhea, weakness, dizziness, leg pain, hip pain, loss of bowel or bladder control, numbness, tingling.   LMP: 11/05/16   The history is provided by the patient and medical records. No language interpreter was used.    Past Medical History:  Diagnosis Date  . Alveolar hypoventilation   . Anemia    not on iron pill  . Arthritis   . Asthma   . Bipolar 2 disorder ([place])   . Carpal tunnel syndrome on right    recurrent  . Cellulitis 08/2010-08/2011  . Chronic pain   . COPD (chronic obstructive pulmonary disease) ([name])    Symbicort daily and Proventil as needed  . Costochondritis   . Depression   . Diabetes mellitus 2000   Type 2, Uncontrolled.Takes Lantus daily.Fasting blood sugar runs 150  . Dizziness    occasionally  . Drug-seeking behavior   . GERD (gastroesophageal reflux disease)    takes Pantoprazole and Zantac daily  . Headache    migraine-last one about a yr ago.Topamax daily  . HLD (hyperlipidemia)    takes Atorvastatin daily  . Hypertension    takes Lisinopril and Coreg daily  . Morbid obesity ([place])   . Muscle spasm    takes Flexeril as needed  . Nocturia   . Obstructive sleep apnea   . Peripheral neuropathy ([name])    takes Gabapentin daily  . Pneumonia    "walking" several yrs ago and as a baby  . Rectal fissure   . Restless leg   . Urinary frequency   . Varicose veins    Right medial thigh and Left leg     Patient Active Problem List   Diagnosis Date Noted  . Adjustment disorder with mixed anxiety and depressed mood 11/04/2016  . History of medication noncompliance 11/04/2016  . Drug overdose 10/30/2016  . Polysubstance overdose 10/29/2016  . Unilateral primary osteoarthritis, right knee 10/22/2016  . Diabetic neuropathy ([place]) 07/14/2016  . De Quervain's tenosynovitis, bilateral 11/01/2015  . Vitamin D deficiency 09/05/2015  . Recurrent candidiasis of vagina 09/05/2015  . Varicose veins of leg with complications [id number]  . Neuropathy, intercostal nerve 12/17/2014  . Rectal itching 11/17/2014  . Restless leg syndrome 10/17/2014  . Chronic sinusitis 07/18/2014  . Headache 07/15/2014  . Vision, loss, sudden 07/12/2014  . Urge incontinence 10/15/2013  . Encounter for chronic pain management 06/30/2013  . Low back pain 01/31/2013  . HLD (hyperlipidemia) 11/19/2012  . Chest pain 06/27/2012  . Right carpal tunnel syndrome 09/01/2011  . Bilateral knee pain 09/01/2011  .  Insulin dependent diabetes mellitus ([place]) 05/22/2008  . Morbid obesity ([place]) 05/22/2008  . 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 DEPRESSION 05/22/2008  . Obstructive sleep apnea 05/22/2008  . Essential hypertension 05/22/2008  . ASTHMA 05/22/2008  . GERD 05/22/2008    Past Surgical History:  Procedure Laterality Date  . CARPAL TUNNEL RELEASE    . CESAREAN SECTION    . KNEE ARTHROSCOPY Right 07/17/2010  . LEFT HEART CATHETERIZATION WITH CORONARY ANGIOGRAM N/A 07/27/2012   Procedure:  LEFT HEART CATHETERIZATION WITH CORONARY ANGIOGRAM;  Surgeon: Sherren Mocha, MD;  Location: [facility] LAB;  Service: Cardiovascular;  Laterality: N/A;  . left knee surgery     screws she thinks  . MASS EXCISION N/A 06/29/2013   Procedure:  WIDE LOCAL EXCISION OF POSTERIOR NECK ABSCESS;  Surgeon: Ralene Ok, MD;  Location: [facility];  Service: General;  Laterality: N/A;    OB History    Gravida Para Term Preterm AB Living   2 1     1 1    SAB TAB Ectopic Multiple Live Births   1               Home Medications    Prior to Admission medications   Medication Sig Start Date End Date Taking? Authorizing Provider  albuterol (PROVENTIL HFA;VENTOLIN HFA) 108 (90 Base) MCG/ACT inhaler Inhale 1-2 puffs into the lungs every 6 (six) hours as needed for wheezing or shortness of breath.   Yes Historical Provider, MD  atorvastatin (LIPITOR) 20 MG tablet Take 1 tablet (20 mg total) by mouth daily. 11/05/16  Yes Kerrie Buffalo, NP  budesonide-formoterol [facility]) 160-4.5 MCG/ACT inhaler Inhale 2 puffs into the lungs 2 (two) times daily.   Yes Historical Provider, MD  carvedilol (COREG) 3.125 MG tablet Take 1 tablet (3.125 mg total) by mouth 2 (two) times daily with a meal. 11/04/16  Yes Kerrie Buffalo, NP  gabapentin (NEURONTIN) 300 MG capsule Take 2 capsules (600 mg total) by mouth 3 (three) times daily. Patient taking differently: Take 900-1,200 mg by mouth 3 (three) times daily. 600mg  tablets  1.5 in the morning and afternoon and 2 tablets at night 11/04/16  Yes Kerrie Buffalo, NP  hydrOXYzine (ATARAX/VISTARIL) 25 MG tablet Take 1 tablet (25 mg total) by mouth every 6 (six) hours as needed for anxiety. 11/04/16  Yes Kerrie Buffalo, NP  insulin glargine (LANTUS) 100 unit/mL SOPN Inject 700 Units into the skin every morning.   Yes Historical Provider, MD  lisinopril (PRINIVIL,ZESTRIL) 5 MG tablet Take 1 tablet (5 mg total) by mouth daily. 11/05/16  Yes Kerrie Buffalo, NP  traZODone (DESYREL) 50 MG tablet  Take 1 tablet (50 mg total) by mouth at bedtime as needed for sleep. 11/04/16  Yes Kerrie Buffalo, NP  cephALEXin (KEFLEX) 500 MG capsule Take 1 capsule (500 mg total) by mouth 4 (four) times daily. 12/10/16   Jarrett Soho Jonise Weightman, PA-C    Family History Family History  Problem Relation Age of Onset  . Diabetes Mother   . Hyperlipidemia Mother   . Depression Mother   . Varicose Veins Mother   . Heart attack Paternal Uncle   . Heart disease Paternal Grandmother   . Heart attack Paternal Grandmother   . Heart attack Paternal Grandfather   . Heart disease Paternal Grandfather   . Heart attack Father     Social History Social History  Substance Use Topics  . Smoking status: Former Smoker    Packs/day: 0.30    Years: 0.30    Types:  Cigarettes    Quit date: 12/06/1993  . Smokeless tobacco: Never Used  . Alcohol use No     Allergies   Kiwi extract and Nubain [nalbuphine hcl]   Review of Systems Review of Systems  Genitourinary: Positive for pelvic pain. Negative for vaginal bleeding, vaginal discharge and vaginal pain.  Musculoskeletal: Positive for back pain ( low).  All other systems reviewed and are negative.    Physical Exam Updated Vital Signs BP 141/92   Pulse 100   Temp 97.9 F (36.6 C) (Oral)   Resp 16   LMP 11/17/2016   SpO2 97%   Physical Exam  Constitutional: She appears well-developed and well-nourished. No distress.  HENT:  Head: Normocephalic and atraumatic.  Mouth/Throat: Oropharynx is clear and moist. No oropharyngeal exudate.  Eyes: Conjunctivae are normal.  Neck: Normal range of motion. Neck supple.  Full ROM without pain  Cardiovascular: Normal rate, regular rhythm, normal heart sounds and intact distal pulses.   No murmur heard. Pulmonary/Chest: Effort normal and breath sounds normal. No respiratory distress. She has no wheezes.  Abdominal: Soft. Bowel sounds are normal. She exhibits no distension. There is generalized tenderness and tenderness  in the suprapubic area. There is no rigidity, no rebound and no guarding. Hernia confirmed negative in the right inguinal area and confirmed negative in the left inguinal area.  Abdominal exam severely limited due to significant obesity Generalized abdominal tenderness worse in the suprapubic region  Genitourinary: Uterus normal. No labial fusion. There is no rash, tenderness or lesion on the right labia. There is no rash, tenderness or lesion on the left labia. Uterus is not deviated, not enlarged, not fixed and not tender. Cervix exhibits no motion tenderness, no discharge and no friability. Right adnexum displays no mass, no tenderness and no fullness. Left adnexum displays no mass, no tenderness and no fullness. No erythema, tenderness or bleeding in the vagina. No foreign body in the vagina. No signs of injury around the vagina. Vaginal discharge (, White, clinging to the vaginal walls) found.  Musculoskeletal: Normal range of motion. She exhibits no edema.  Full range of motion of the T-spine and L-spine No midline tenderness to the  T-spine or L-spine Mild tenderness to palpation of the paraspinous muscles of the L-spine  Lymphadenopathy:    She has no cervical adenopathy.       Right: No inguinal adenopathy present.       Left: No inguinal adenopathy present.  Neurological: She is alert. She has normal reflexes.  Reflex Scores:      Bicep reflexes are 2+ on the right side and 2+ on the left side.      Brachioradialis reflexes are 2+ on the right side and 2+ on the left side.      Patellar reflexes are 2+ on the right side and 2+ on the left side.      Achilles reflexes are 2+ on the right side and 2+ on the left side. Speech is clear and goal oriented, follows commands Normal 5/5 strength in upper and lower extremities bilaterally including dorsiflexion and plantar flexion, strong and equal grip strength Sensation normal to light and sharp touch Moves extremities without ataxia,  coordination intact Normal gait Normal balance No Clonus  Skin: Skin is warm and dry. No rash noted. She is not diaphoretic. No erythema.  Psychiatric: She has a normal mood and affect. Her behavior is normal.  Nursing note and vitals reviewed.    ED Treatments / Results  Labs (  all labs ordered are listed, but only abnormal results are displayed) Labs Reviewed  WET PREP, GENITAL - Abnormal; Notable for the following:       Result Value   Yeast Wet Prep HPF POC PRESENT (*)    Clue Cells Wet Prep HPF POC PRESENT (*)    WBC, Wet Prep HPF POC MANY (*)    All other components within normal limits  COMPREHENSIVE METABOLIC PANEL - Abnormal; Notable for the following:    Sodium 134 (*)    Chloride 95 (*)    Glucose, Bld 437 (*)    Albumin 3.0 (*)    All other components within normal limits  URINALYSIS, ROUTINE W REFLEX MICROSCOPIC - Abnormal; Notable for the following:    APPearance CLOUDY (*)    Glucose, UA >=500 (*)    Hgb urine dipstick SMALL (*)    Ketones, ur 20 (*)    Protein, ur 100 (*)    Leukocytes, UA SMALL (*)    Bacteria, UA FEW (*)    Squamous Epithelial / LPF 6-30 (*)    All other components within normal limits  URINE CULTURE  LIPASE, BLOOD  CBC  I-STAT BETA HCG BLOOD, ED ([facility], [facility], [facility] ONLY)  GC/CHLAMYDIA PROBE AMP ([place]) NOT AT [facility]    Radiology Ct Abdomen Pelvis W Contrast  Result Date: 12/10/2016 CLINICAL DATA:  Lower abdominal pain, low back pain EXAM: CT ABDOMEN AND PELVIS WITH CONTRAST TECHNIQUE: Multidetector CT imaging of the abdomen and pelvis was performed using the standard protocol following bolus administration of intravenous contrast. CONTRAST:  134mL ISOVUE-300 IOPAMIDOL (ISOVUE-300) INJECTION 61% COMPARISON:  11/23/2015 FINDINGS: Lower chest: Lung bases demonstrate no acute consolidation or pleural effusion. Minimal atelectasis medial right lung base. Heart size is nonenlarged. Hepatobiliary: No focal liver abnormality is seen. No gallstones,  gallbladder wall thickening, or biliary dilatation. Liver slightly enlarged at 20 cm. Pancreas: Unremarkable. No pancreatic ductal dilatation or surrounding inflammatory changes. Spleen: Normal in size without focal abnormality. Adrenals/Urinary Tract: Stable 2.3 cm low-density mass in the right adrenal gland, consistent with adenoma.Left adrenal gland is normal. Kidneys show no hydronephrosis or focal mass lesion. The bladder is unremarkable. Stomach/Bowel: Stomach is within normal limits. Appendix appears normal. No evidence of bowel wall thickening, distention, or inflammatory changes. Vascular/Lymphatic: Aortic atherosclerosis. No enlarged abdominal or pelvic lymph nodes. Reproductive: Uterus and bilateral adnexa are unremarkable. Other: No free air or free fluid. Scar within the midline anterior abdominal wall. Musculoskeletal: No acute or significant osseous findings. IMPRESSION: 1. No CT evidence for acute intra- abdominal or pelvic pathology 2. Stable right adrenal gland adenoma 3. Mildly enlarged liver Electronically Signed   By: Donavan Foil M.D.   On: 12/10/2016 03:15    Procedures Procedures (including critical care time)  Medications Ordered in ED Medications  morphine 4 MG/ML injection 4 mg (4 mg Intravenous Given 12/10/16 0218)  sodium chloride 0.9 % bolus 1,000 mL (0 mLs Intravenous Stopped 12/10/16 0422)  iopamidol (ISOVUE-300) 61 % injection (100 mLs  Contrast Given 12/10/16 0246)  ondansetron (ZOFRAN) injection 4 mg (4 mg Intravenous Given 12/10/16 0314)  fluconazole (DIFLUCAN) tablet 200 mg (200 mg Oral Given 12/10/16 0400)     Initial Impression / Assessment and Plan / ED Course  I have reviewed the triage vital signs and the nursing notes.  Pertinent labs & imaging results that were available during my care of the patient were reviewed by me and considered in my medical decision making (see chart for details).  Clinical Course as of Dec 10 700  Fri Dec 10, 2016  0355 Pt ambulates  without difficulty in the ED. Her pain is well controlled.    [HM]    Clinical Course User Index [HM] Jarrett Soho Emeline Simpson, PA-C    Presents with low back and pelvic pain. Urinalysis with likely evidence of UTI. Urine culture sent. Wet prep and clinical exam consistent with yeast. No cervical motion tenderness. Patient the risk for STDs. Glucose elevated, but patient reports baseline. Record review shows the patient is often 300-400. Patient given fluids.  Normal anion gap.  CT scan without acute abnormality of the abdomen. Patient is tolerating by mouth without difficulty.  Abdomen remained soft without rebound or guarding. She ambulates without difficulty in emergency department and is well appearing. Vital signs are stable. Patient will be discharged home with close PCP follow-up within the next 24 hours. She states understanding and is in agreement with the plan.      Final Clinical Impressions(s) / ED Diagnoses   Final diagnoses:  Lower abdominal pain  Yeast vaginitis  Bilateral low back pain without sciatica, unspecified chronicity  Acute cystitis with hematuria    New Prescriptions Discharge Medication List as of 12/10/2016  4:01 AM    START taking these medications   Details  cephALEXin (KEFLEX) 500 MG capsule Take 1 capsule (500 mg total) by mouth 4 (four) times daily., Starting Fri 12/10/2016, Print         Sibley Rolison, PA-C 12/10/16 [place], MD 12/24/16 [phone]

## 2016-12-10 NOTE — ED Notes (Signed)
Pt dressed and ambulated down hall without additional assistance needed however, when standing pt stated that she get a bit dizzy.

## 2016-12-12 LAB — URINE CULTURE

## 2016-12-13 LAB — GC/CHLAMYDIA PROBE AMP (~~LOC~~) NOT AT ARMC
CHLAMYDIA, DNA PROBE: NEGATIVE
NEISSERIA GONORRHEA: NEGATIVE

## 2016-12-28 ENCOUNTER — Encounter (HOSPITAL_COMMUNITY): Payer: Self-pay | Admitting: *Deleted

## 2016-12-28 ENCOUNTER — Inpatient Hospital Stay (HOSPITAL_COMMUNITY)
Admission: AD | Admit: 2016-12-28 | Discharge: 2016-12-28 | Disposition: A | Discharge status: Home / Self Care | Payer: Medicaid Other | Source: Ambulatory Visit | Attending: Family Medicine | Admitting: Family Medicine

## 2016-12-28 DIAGNOSIS — K219 Gastro-esophageal reflux disease without esophagitis: Secondary | ICD-10-CM | POA: Diagnosis not present

## 2016-12-28 DIAGNOSIS — E119 Type 2 diabetes mellitus without complications: Secondary | ICD-10-CM | POA: Diagnosis not present

## 2016-12-28 DIAGNOSIS — Z87891 Personal history of nicotine dependence: Secondary | ICD-10-CM | POA: Diagnosis not present

## 2016-12-28 DIAGNOSIS — N926 Irregular menstruation, unspecified: Secondary | ICD-10-CM | POA: Diagnosis not present

## 2016-12-28 DIAGNOSIS — Z3202 Encounter for pregnancy test, result negative: Secondary | ICD-10-CM | POA: Insufficient documentation

## 2016-12-28 DIAGNOSIS — I10 Essential (primary) hypertension: Secondary | ICD-10-CM | POA: Insufficient documentation

## 2016-12-28 DIAGNOSIS — J449 Chronic obstructive pulmonary disease, unspecified: Secondary | ICD-10-CM | POA: Insufficient documentation

## 2016-12-28 DIAGNOSIS — N939 Abnormal uterine and vaginal bleeding, unspecified: Secondary | ICD-10-CM | POA: Diagnosis present

## 2016-12-28 LAB — CBC
HEMATOCRIT: 37.2 % (ref 36.0–46.0)
HEMOGLOBIN: 12.8 g/dL (ref 12.0–15.0)
MCH: 29.9 pg (ref 26.0–34.0)
MCHC: 34.4 g/dL (ref 30.0–36.0)
MCV: 86.9 fL (ref 78.0–100.0)
Platelets: 297 10*3/uL (ref 150–400)
RBC: 4.28 MIL/uL (ref 3.87–5.11)
RDW: 12.4 % (ref 11.5–15.5)
WBC: 9.6 10*3/uL (ref 4.0–10.5)

## 2016-12-28 LAB — URINALYSIS, ROUTINE W REFLEX MICROSCOPIC
Bacteria, UA: NONE SEEN
Bilirubin Urine: NEGATIVE
Glucose, UA: >=500 mg/dL — AB
Ketones, ur: NEGATIVE mg/dL
Leukocytes, UA: NEGATIVE
NITRITE: NEGATIVE
PH: 5 (ref 5.0–8.0)
Protein, ur: NEGATIVE mg/dL
SPECIFIC GRAVITY, URINE: 1.026 (ref 1.005–1.030)

## 2016-12-28 LAB — POCT PREGNANCY, URINE: PREG TEST UR: NEGATIVE

## 2016-12-28 LAB — HCG, QUANTITATIVE, PREGNANCY

## 2016-12-28 NOTE — MAU Note (Signed)
PT  SAYS   SHE DID HPT-  2 WEEKS  AGO-  POSITIVE.    NO BIRTH  CONTROL.    LAST SEX-    2 WEEKS AGO.      SAYS STARTED BLEEDING  AT 2330- IN HER UNDERWEAR AND  BLOOD  IN TOILET .    NO PAIN.     HAD AN APPOINTMENT  WITH - DR Willis Modena  LAST WEEK   BUT  MISSED  IT .Marland Kitchen

## 2016-12-28 NOTE — MAU Provider Note (Signed)
History     CSN: [id number]  Arrival date and time: 12/28/16 0028   First Provider Initiated Contact with Patient 12/28/16 0123      Chief Complaint  Patient presents with  . Vaginal Bleeding   [name] is a 41 y.o. G2P0011 who presents today with vaginal bleeding. She reports that she had a +UPT at home sometime during the first week of January.    Vaginal Bleeding  The patient's primary symptoms include pelvic pain and vaginal bleeding. This is a new problem. The current episode started today. The problem occurs intermittently. The problem has been unchanged. Pain severity now: 7/10. The problem affects both sides. She is not pregnant. Pertinent negatives include no abdominal pain, chills, constipation, diarrhea, dysuria, fever, nausea, urgency or vomiting. The vaginal bleeding is lighter than menses. She has been passing clots (about the size of a ping pong ball. ). She has not been passing tissue. Nothing aggravates the symptoms. She has tried nothing for the symptoms. Her menstrual history has been regular (LMP 10/29/16 ).    Past Medical History:  Diagnosis Date  . Alveolar hypoventilation   . Anemia    not on iron pill  . Arthritis   . Asthma   . Bipolar 2 disorder ([place])   . Carpal tunnel syndrome on right    recurrent  . Cellulitis 08/2010-08/2011  . Chronic pain   . COPD (chronic obstructive pulmonary disease) ([name])    Symbicort daily and Proventil as needed  . Costochondritis   . Depression   . Diabetes mellitus 2000   Type 2, Uncontrolled.Takes Lantus daily.Fasting blood sugar runs 150  . Dizziness    occasionally  . Drug-seeking behavior   . GERD (gastroesophageal reflux disease)    takes Pantoprazole and Zantac daily  . Headache    migraine-last one about a yr ago.Topamax daily  . HLD (hyperlipidemia)    takes Atorvastatin daily  . Hypertension    takes Lisinopril and Coreg daily  . Morbid obesity ([place])   . Muscle spasm    takes Flexeril as needed  .  Nocturia   . Obstructive sleep apnea   . Peripheral neuropathy ([name])    takes Gabapentin daily  . Pneumonia    "walking" several yrs ago and as a baby  . Rectal fissure   . Restless leg   . Urinary frequency   . Varicose veins    Right medial thigh and Left leg     Past Surgical History:  Procedure Laterality Date  . CARPAL TUNNEL RELEASE    . CESAREAN SECTION    . KNEE ARTHROSCOPY Right 07/17/2010  . LEFT HEART CATHETERIZATION WITH CORONARY ANGIOGRAM N/A 07/27/2012   Procedure: LEFT HEART CATHETERIZATION WITH CORONARY ANGIOGRAM;  Surgeon: Sherren Mocha, MD;  Location: [facility] CATH LAB;  Service: Cardiovascular;  Laterality: N/A;  . left knee surgery     screws she thinks  . MASS EXCISION N/A 06/29/2013   Procedure:  WIDE LOCAL EXCISION OF POSTERIOR NECK ABSCESS;  Surgeon: Ralene Ok, MD;  Location: [place];  Service: General;  Laterality: N/A;    Family History  Problem Relation Age of Onset  . Diabetes Mother   . Hyperlipidemia Mother   . Depression Mother   . Varicose Veins Mother   . Heart attack Paternal Uncle   . Heart disease Paternal Grandmother   . Heart attack Paternal Grandmother   . Heart attack Paternal Grandfather   . Heart disease Paternal Grandfather   . Heart  attack Father     Social History  Substance Use Topics  . Smoking status: Former Smoker    Packs/day: 0.30    Years: 0.30    Types: Cigarettes    Quit date: 12/06/1993  . Smokeless tobacco: Never Used  . Alcohol use No    Allergies:  Allergies  Allergen Reactions  . Kiwi Extract Shortness Of Breath  . Nubain [Nalbuphine Hcl] Other (See Comments)    "FEELS LIKE SOMETHING CRAWLING ON ME"    Prescriptions Prior to Admission  Medication Sig Dispense Refill Last Dose  . HYDROcodone-acetaminophen (NORCO) 10-325 MG tablet Take 1 tablet by mouth every 8 (eight) hours as needed for moderate pain.     Marland Kitchen albuterol (PROVENTIL HFA;VENTOLIN HFA) 108 (90 Base) MCG/ACT inhaler Inhale 1-2 puffs into the  lungs every 6 (six) hours as needed for wheezing or shortness of breath.   unk  . atorvastatin (LIPITOR) 20 MG tablet Take 1 tablet (20 mg total) by mouth daily. 30 tablet 0 12/09/2016 at Unknown time  . budesonide-formoterol (SYMBICORT) 160-4.5 MCG/ACT inhaler Inhale 2 puffs into the lungs 2 (two) times daily.   12/09/2016 at Unknown time  . carvedilol (COREG) 3.125 MG tablet Take 1 tablet (3.125 mg total) by mouth 2 (two) times daily with a meal. 60 tablet 0 12/09/2016 at 1900  . cephALEXin (KEFLEX) 500 MG capsule Take 1 capsule (500 mg total) by mouth 4 (four) times daily. 40 capsule 0   . gabapentin (NEURONTIN) 300 MG capsule Take 2 capsules (600 mg total) by mouth 3 (three) times daily. (Patient taking differently: Take 900-1,200 mg by mouth 3 (three) times daily. 600mg  tablets  1.5 in the morning and afternoon and 2 tablets at night) 180 capsule 0 12/09/2016 at Unknown time  . hydrOXYzine (ATARAX/VISTARIL) 25 MG tablet Take 1 tablet (25 mg total) by mouth every 6 (six) hours as needed for anxiety. 30 tablet 0 unk  . insulin glargine (LANTUS) 100 unit/mL SOPN Inject 700 Units into the skin every morning.   12/09/2016 at Unknown time  . lisinopril (PRINIVIL,ZESTRIL) 5 MG tablet Take 1 tablet (5 mg total) by mouth daily. 30 tablet 0 12/09/2016 at Unknown time  . traZODone (DESYREL) 50 MG tablet Take 1 tablet (50 mg total) by mouth at bedtime as needed for sleep. 30 tablet 0 Past Month at Unknown time    Review of Systems  Constitutional: Negative for chills and fever.  Gastrointestinal: Negative for abdominal pain, constipation, diarrhea, nausea and vomiting.  Genitourinary: Positive for pelvic pain and vaginal bleeding. Negative for dysuria and urgency.   Physical Exam   Blood pressure 137/84, pulse 98, temperature 97.8 F (36.6 C), temperature source Oral, resp. rate 20, height 5\' 2"  (1.575 m), weight (!) 305 lb 8 oz (138.6 kg), last menstrual period 10/29/2016.  Physical Exam  Nursing note and  vitals reviewed. Constitutional: She is oriented to person, place, and time. She appears well-developed and well-nourished. No distress.  HENT:  Head: Normocephalic.  Cardiovascular: Normal rate.   Respiratory: Effort normal.  GI: Soft. There is no tenderness. There is no rebound.  Neurological: She is alert and oriented to person, place, and time.  Skin: Skin is warm and dry.  Psychiatric: She has a normal mood and affect.     Results for orders placed or performed during the hospital encounter of 12/28/16 (from the past 24 hour(s))  Urinalysis, Routine w reflex microscopic     Status: Abnormal   Collection Time: 12/28/16 12:53 AM  Result Value Ref Range   Color, Urine STRAW (A) YELLOW   APPearance CLEAR CLEAR   Specific Gravity, Urine 1.026 1.005 - 1.030   pH 5.0 5.0 - 8.0   Glucose, UA >=500 (A) NEGATIVE mg/dL   Hgb urine dipstick LARGE (A) NEGATIVE   Bilirubin Urine NEGATIVE NEGATIVE   Ketones, ur NEGATIVE NEGATIVE mg/dL   Protein, ur NEGATIVE NEGATIVE mg/dL   Nitrite NEGATIVE NEGATIVE   Leukocytes, UA NEGATIVE NEGATIVE   RBC / HPF TOO NUMEROUS TO COUNT 0 - 5 RBC/hpf   WBC, UA 0-5 0 - 5 WBC/hpf   Bacteria, UA NONE SEEN NONE SEEN   Squamous Epithelial / LPF 0-5 (A) NONE SEEN  Pregnancy, urine POC     Status: None   Collection Time: 12/28/16  1:13 AM  Result Value Ref Range   Preg Test, Ur NEGATIVE NEGATIVE  hCG, quantitative, pregnancy     Status: None   Collection Time: 12/28/16  1:34 AM  Result Value Ref Range   hCG, Beta Chain, Quant, S <1 <5 mIU/mL  CBC     Status: None   Collection Time: 12/28/16  1:34 AM  Result Value Ref Range   WBC 9.6 4.0 - 10.5 K/uL   RBC 4.28 3.87 - 5.11 MIL/uL   Hemoglobin 12.8 12.0 - 15.0 g/dL   HCT 37.2 36.0 - 46.0 %   MCV 86.9 78.0 - 100.0 fL   MCH 29.9 26.0 - 34.0 pg   MCHC 34.4 30.0 - 36.0 g/dL   RDW 12.4 11.5 - 15.5 %   Platelets 297 150 - 400 K/uL    MAU Course  Procedures  MDM   Assessment and Plan   1. Abnormal  menstrual cycle    DC home Comfort measures reviewed  Bleeding precautions RX: none  Return to MAU as needed FU with OB as planned  Follow-up Information    [facility] Follow up.   Contact information: [place] [address] [phone]          Mathis Bud 12/28/2016, 1:27 AM

## 2016-12-28 NOTE — Discharge Instructions (Signed)
Menstruation Menstruation is the monthly passing of blood, tissue, fluid, and mucus. It is also known as a period. Your body is shedding the lining of the uterus. The flow of blood usually occurs during 3-7 consecutive days each month. Hormones control the menstrual cycle. Hormones are a chemical substance produced by endocrine glands in the body to regulate different bodily functions. The first menstrual period may start any time between age 39 years to 16 years. However, it usually starts around age 12 years. Some girls have regular monthly menstrual cycles right from the beginning. However, it is not unusual to have only a couple of drops of blood or spotting when you first start menstruating. It is also not unusual to have two periods a month or miss a month or two when first starting your periods. What are the symptoms?  Mild to moderate abdominal cramps.  Aching or pain in the lower back area. Symptoms may occur 5-10 days before your menstrual period starts. These symptoms are referred to as premenstrual syndrome (PMS). These symptoms can include:  Headache.  Breast tenderness and swelling.  Bloating.  Tiredness (fatigue).  Mood changes.  Craving for certain foods. These are normal signs and symptoms and can vary in severity. To help relieve these problems, ask your caregiver if you can take over-the-counter medications for pain or discomfort. If the symptoms are not controllable, see your caregiver for help. Hormones involved in menstruation Menstruation comes about because of hormones produced by the pituitary gland in the brain and the ovaries that affect the uterine lining. First, the pituitary gland in the brain produces the hormone follicle stimulating hormone (FSH). FSH stimulates the ovaries to produce estrogen, which thickens the uterine lining and begins to develop an egg in the ovary. About 14 days later, the pituitary gland produces another hormone called luteinizing hormone  (LH). LH causes the egg to come out of a sac in the ovary (ovulation). The empty sac on the ovary called the corpus luteum is stimulated by another hormone from the pituitary gland called luteotropin. The corpus luteum begins to produce the estrogen and progesterone hormone. The progesterone hormone prepares the lining of the uterus to have the fertilized egg (egg combined with sperm) attach to the lining of the uterus and begin to develop into a fetus. If the egg is not fertilized, the corpus luteum stops producing estrogen and progesterone, it disappears, the lining of the uterus sloughs off and a menstrual period begins. Then the menstrual cycle starts all over again and will continue monthly unless pregnancy occurs or menopause begins. The secretion of hormones is complex. Various parts of the body become involved in many chemical activities. Female sex hormones have other functions in a woman's body as well. Estrogen increases a woman's sex drive (libido). It naturally helps body get rid of fluids (diuretic). It also aids in the process of building new bone. Therefore, maintaining hormonal health is essential to all levels of a woman's well being. These hormones are usually present in normal amounts and cause you to menstruate. It is the relationship between the (small) levels of the hormones that is critical. When the balance is upset, menstrual irregularities can occur. How does the menstrual cycle happen?  Menstrual cycles vary in length from 21-35 days with an average of 29 days. The cycle begins on the first day of bleeding. At this time, the pituitary gland in the brain releases FSH that travels through the bloodstream to the ovaries. The FSH stimulates the follicles   in the ovaries. This prepares the body for ovulation that occurs around the 14th day of the cycle. The ovaries produce estrogen, and this makes sure conditions are right in the uterus for implantation of the fertilized egg.  When the  levels of estrogen reach a high enough level, it signals the gland in the brain (pituitary gland) to release a surge of LH. This causes the release of the ripest egg from its follicle (ovulation). Usually only one follicle releases one egg, but sometimes more than one follicle releases an egg especially when stimulating the ovaries for in vitro fertilization. The egg can then be collected by either fallopian tube to await fertilization. The burst follicle within the ovary that is left behind is now called the corpus luteum or "yellow body." The corpus luteum continues to give off (secrete) reduced amounts of estrogen. This closes and hardens the cervix. It dries up the mucus to the naturally infertile condition.  The corpus luteum also begins to give off greater amounts of progesterone. This causes the lining of the uterus (endometrium) to thicken even more in preparation for the fertilized egg. The egg is starting to journey down from the fallopian tube to the uterus. It also signals the ovaries to stop releasing eggs. It assists in returning the cervical mucus to its infertile state.  If the egg implants successfully into the womb lining and pregnancy occurs, progesterone levels will continue to raise. It is often this hormone that gives some pregnant women a feeling of well being, like a "natural high." Progesterone levels drop again after childbirth.  If fertilization does not occur, the corpus luteum dies, stopping the production of hormones. This sudden drop in progesterone causes the uterine lining to break down, accompanied by blood (menstruation).  This starts the cycle back at day 1. The whole process starts all over again. Woman go through this cycle every month from puberty to menopause. Women have breaks only for pregnancy and breastfeeding (lactation), unless the woman has health problems that affect the female hormone system or chooses to use oral contraceptives to have unnatural menstrual  periods. Follow these instructions at home:  Keep track of your periods by using a calendar.  If you use tampons, get the least absorbent to avoid toxic shock syndrome.  Do not leave tampons in the vagina over night or longer than 6 hours.  Wear a sanitary pad over night.  Exercise 3-5 times a week or more.  Avoid foods and drinks that you know will make your symptoms worse before or during your period. Contact a health care provider if:  You develop a fever with your period.  Your periods are lasting more than 7 days.  Your period is so heavy that you have to change pads or tampons every 30 minutes.  You develop clots with your period and never had clots before.  You cannot get relief from over-the-counter medication for your symptoms.  Your period has not started, and it has been longer than 35 days. This information is not intended to replace advice given to you by your health care provider. Make sure you discuss any questions you have with your health care provider. Document Released: 11/12/2002 Document Revised: 04/29/2016 Document Reviewed: 06/21/2013 Elsevier Interactive Patient Education  2017 [employer]  

## 2016-12-30 ENCOUNTER — Other Ambulatory Visit: Payer: Self-pay | Admitting: *Deleted

## 2016-12-31 MED ORDER — BUDESONIDE-FORMOTEROL FUMARATE 160-4.5 MCG/ACT IN AERO: 2.0000 | INHALATION_SPRAY | Freq: Two times a day (BID) | RESPIRATORY_TRACT | 1 refills | Status: DC

## 2017-01-01 IMAGING — CT CT RENAL STONE PROTOCOL
2 of 3 series · 17 of 46 positions shown, 19 images · non-contrast
Comparison: 12/03/2013

CLINICAL DATA: Left pelvic pain. Pain began yesterday. History of a
rectal fissure.

EXAM:
CT ABDOMEN AND PELVIS WITHOUT CONTRAST
TECHNIQUE: Multidetector CT imaging of the abdomen and pelvis was performed
following the standard protocol without IV contrast.

[Series 4: lung · axial · 0.81mm/px · z∈[-50,+85]mm · 14 of 32 slices shown, 16 images]
[im 3/32  soft-tissue]
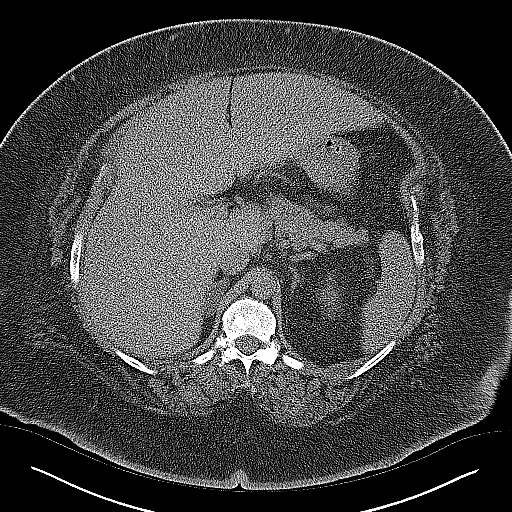
[im 3/32  bone]
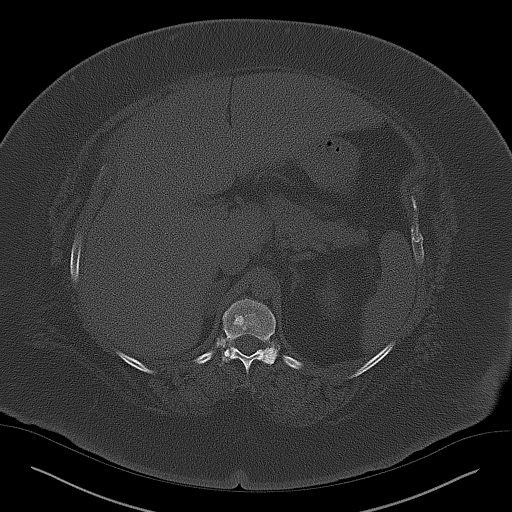
[im 5/32  soft-tissue]
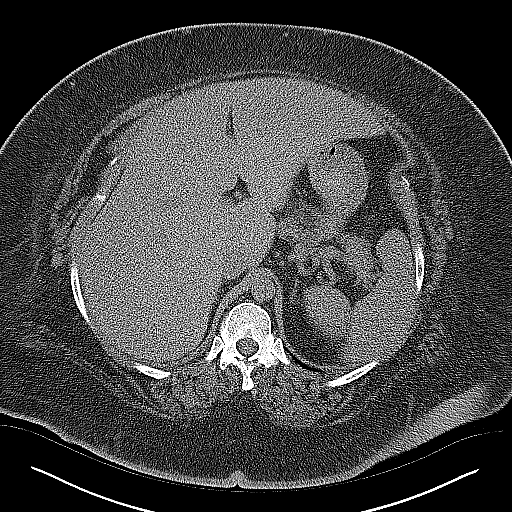
[im 7/32  soft-tissue]
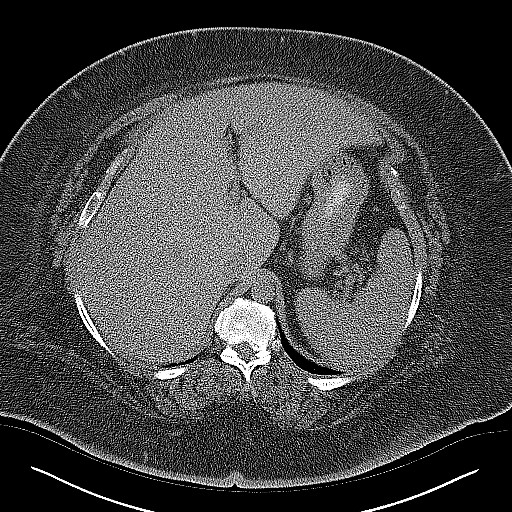
[im 9/32  soft-tissue]
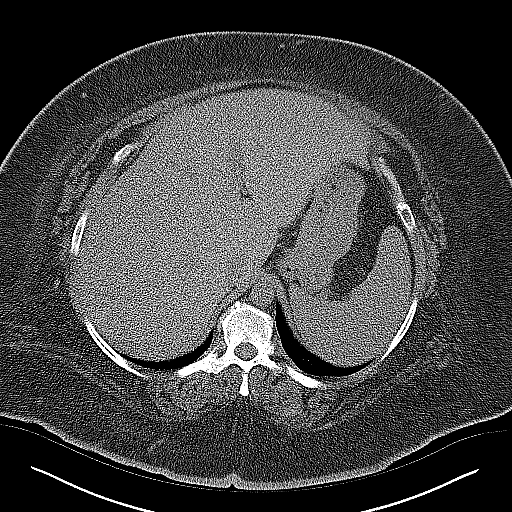
[im 11/32  soft-tissue]
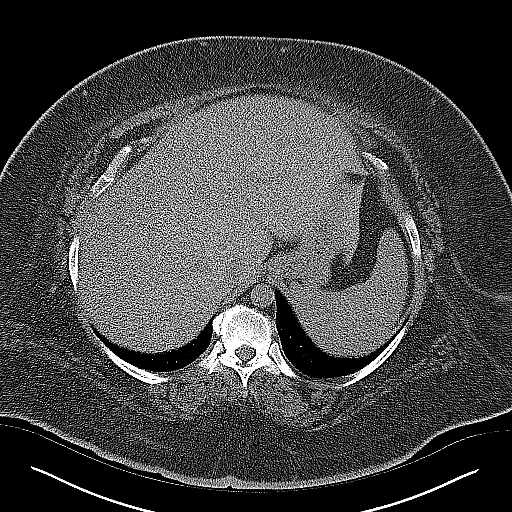
[im 13/32  soft-tissue]
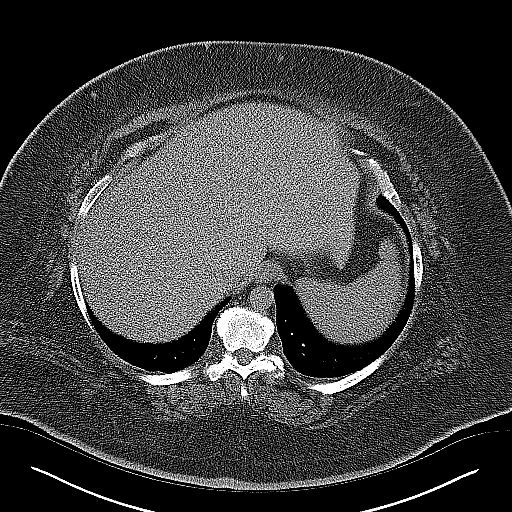
[im 15/32  soft-tissue]
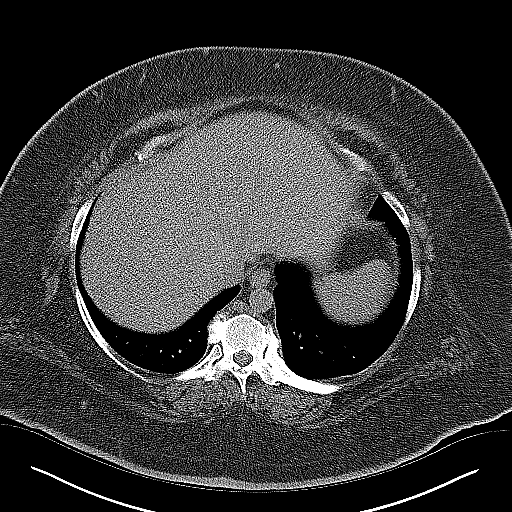
[im 18/32  soft-tissue]
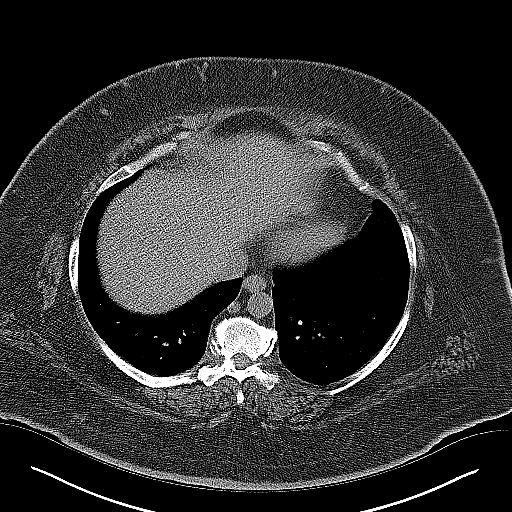
[im 20/32  soft-tissue]
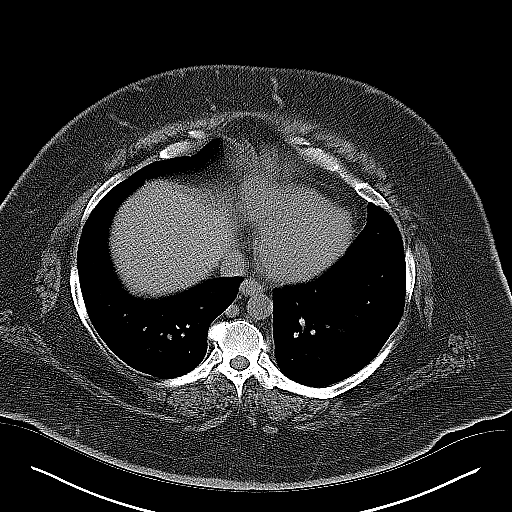
[im 20/32  bone]
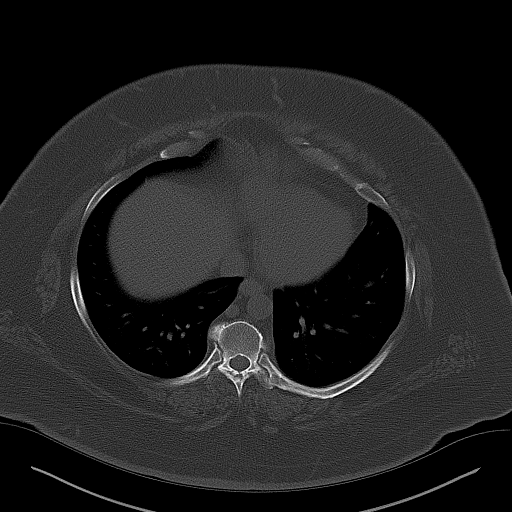
[im 22/32  soft-tissue]
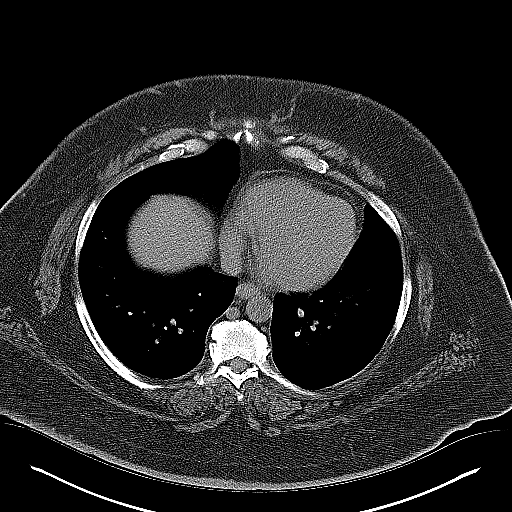
[im 24/32  soft-tissue]
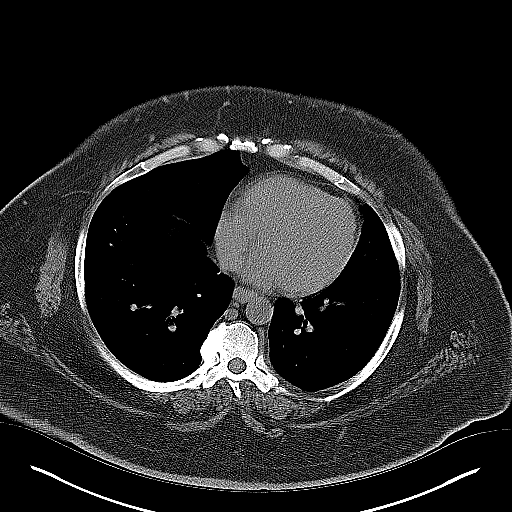
[im 26/32  soft-tissue]
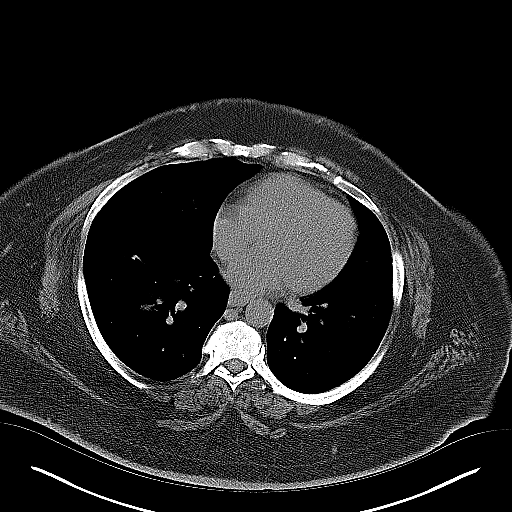
[im 28/32  soft-tissue]
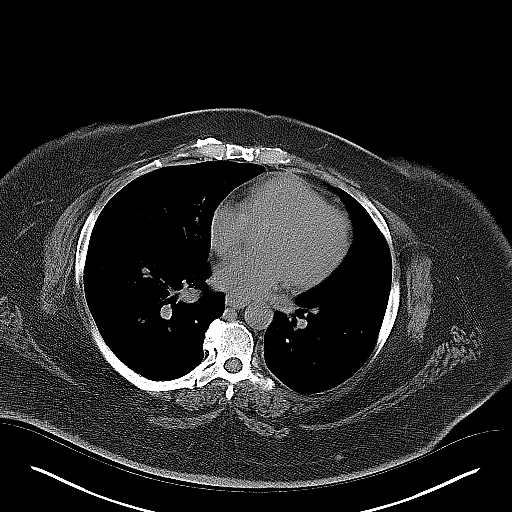
[im 30/32  soft-tissue]
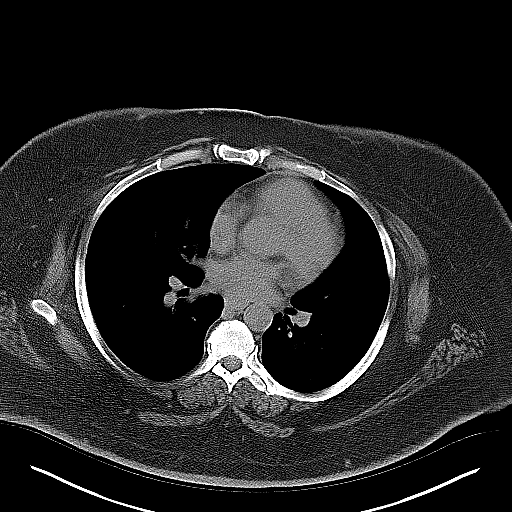

[Series 5: coronal · coronal · 0.90mm/px · 3 of 138 slices shown]
[im 46/138  soft-tissue]
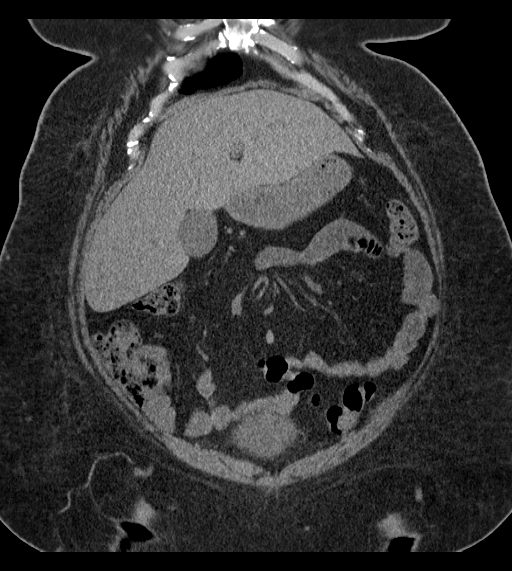
[im 61/138  soft-tissue]
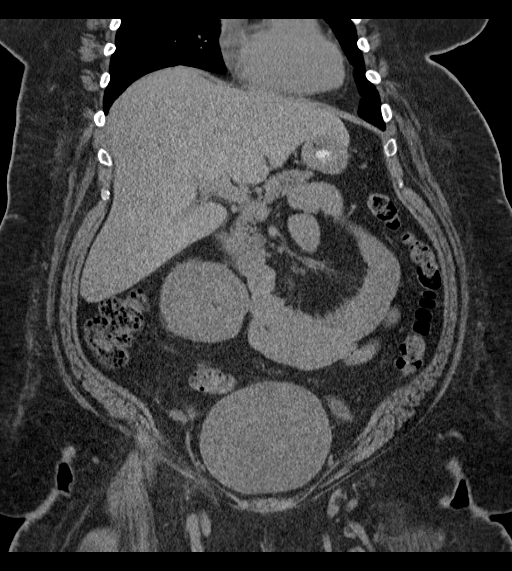
[im 77/138  soft-tissue]
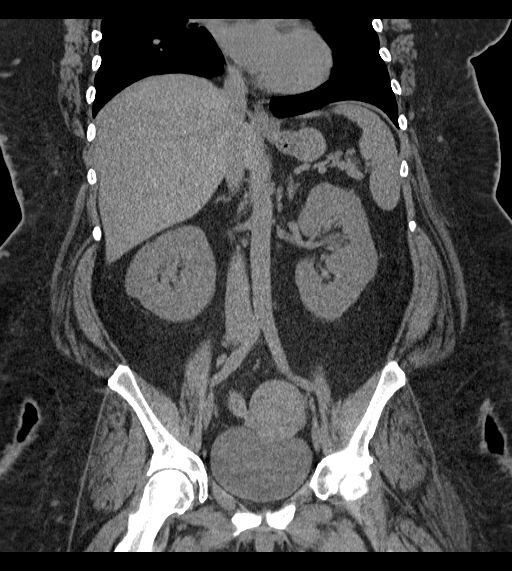

[17 of 46 positions shown; findings below may reference images not displayed]

FINDINGS: Lung bases:  Clear.  Heart normal size.

Liver:  Borderline enlarged.  No mass or focal lesion.

Spleen, gallbladder, pancreas:  Unremarkable.

Adrenal glands: 2.4 cm low-density right adrenal mass, stable
consistent with an adenoma. Normal left adrenal gland.

Kidneys, ureters, bladder: Right kidney mildly displaced inferiorly
by the liver, stable. No renal masses or stones. No hydronephrosis.
Normal ureters. Normal bladder.

Uterus and adnexa:  Unremarkable.

Lymph nodes:  No adenopathy.

Ascites: None.

Gastrointestinal:  Unremarkable.  Normal appendix visualized.

Abdominal wall: Stable appearance of a midline incision. No hernia.

Musculoskeletal: Mild degenerative changes of the spine and hips. No
osteoblastic or osteolytic lesions.
IMPRESSION: 1. No acute findings. No findings to explain pelvic pain. No
evidence of renal or ureteral stones or obstructive uropathy
2. Stable right adrenal adenoma. Borderline hepatomegaly, also
stable.

## 2017-01-06 ENCOUNTER — Other Ambulatory Visit: Payer: Self-pay

## 2017-01-11 ENCOUNTER — Encounter: Payer: Self-pay | Admitting: Family Medicine

## 2017-01-11 ENCOUNTER — Ambulatory Visit (INDEPENDENT_AMBULATORY_CARE_PROVIDER_SITE_OTHER): Payer: Medicaid Other | Admitting: Family Medicine

## 2017-01-11 ENCOUNTER — Other Ambulatory Visit: Payer: Medicaid Other

## 2017-01-11 VITALS — BP 128/82 | HR 90 | Temp 97.4°F | Ht 62.0 in | Wt 293.6 lb

## 2017-01-11 DIAGNOSIS — E785 Hyperlipidemia, unspecified: Secondary | ICD-10-CM

## 2017-01-11 DIAGNOSIS — J45909 Unspecified asthma, uncomplicated: Secondary | ICD-10-CM

## 2017-01-11 DIAGNOSIS — E559 Vitamin D deficiency, unspecified: Secondary | ICD-10-CM

## 2017-01-11 DIAGNOSIS — F1190 Opioid use, unspecified, uncomplicated: Secondary | ICD-10-CM

## 2017-01-11 DIAGNOSIS — R51 Headache: Secondary | ICD-10-CM | POA: Diagnosis not present

## 2017-01-11 DIAGNOSIS — G2581 Restless legs syndrome: Secondary | ICD-10-CM | POA: Diagnosis not present

## 2017-01-11 DIAGNOSIS — G8929 Other chronic pain: Secondary | ICD-10-CM

## 2017-01-11 DIAGNOSIS — I10 Essential (primary) hypertension: Secondary | ICD-10-CM

## 2017-01-11 DIAGNOSIS — L089 Local infection of the skin and subcutaneous tissue, unspecified: Secondary | ICD-10-CM | POA: Diagnosis not present

## 2017-01-11 DIAGNOSIS — Z7189 Other specified counseling: Secondary | ICD-10-CM | POA: Diagnosis not present

## 2017-01-11 LAB — LIPID PANEL
Cholesterol: 220 mg/dL — ABNORMAL HIGH (ref <200)
HDL: 35 mg/dL — ABNORMAL LOW (ref >50)
LDL CALC: 133 mg/dL — AB (ref <100)
Total CHOL/HDL Ratio: 6.3 Ratio — ABNORMAL HIGH (ref <5.0)
Triglycerides: 262 mg/dL — ABNORMAL HIGH (ref <150)
VLDL: 52 mg/dL — ABNORMAL HIGH (ref <30)

## 2017-01-11 MED ORDER — HYDROCODONE-ACETAMINOPHEN 10-325 MG PO TABS: 1.0000 | ORAL_TABLET | Freq: Three times a day (TID) | ORAL | 0 refills | Status: DC | PRN

## 2017-01-11 MED ORDER — NALOXONE HCL 4 MG/0.1ML NA LIQD: NASAL | 0 refills | Status: DC

## 2017-01-11 NOTE — Progress Notes (Addendum)
Date of Visit: 01/11/2017   HPI:  Patient presents for follow up of chronic pain.  Chronic pain - Currently she is taking norco 10-325mg  one tablet three times daily for pain. Experiences good relief of pain with this, and it helps her increase activity levels so that she can work on weight loss. She is storing her medication safely (uses a lock box). Denies unwanted side effects from the medicine. Notably, in November patient was hospitalized after intentionally overdosing on flexeril, which was in effort to commit suicide. She was then transferred to behavioral health hospital. Patient is forthcoming about this episode to me. It was in the context of significant marital strife. She and her husband are now separated. She is very remorseful about this event and states she has to live and has promised her daughter that she will never hurt herself again. No current SI or HI. We had a frank discussion about risks and benefits of chronic narcotics, and the high risks associated in patients who have history of overdoses on other medications. She understands these risks. Her last dose of norco was a couple days ago, as she's been out of it. Also discussed UDS from that hospitalization that was positive for cocaine. Patient denies ever using cocaine, does not understand how this was in her urine. She consents to UDS testing today.  Asthma - currently taking symbicort 2 puffs twice daily. Using albuterol rarely. Asthma well controlled.  Hypertension - takes lisinopril 5mg  daily, coreg 3.125mg  twice daily  Hyperlipidemia - taking lipitor 20mg  daily. Due for lipids  Vit D deficiency - not currently on supplementation.   ROS: See HPI.  [place]: history of diabetes (followed by endocrinology), hypertension, asthma, GERD, hyperlipidemia, chronic R knee pain on narcotics  PHYSICAL EXAM: BP 128/82   Pulse 90   Temp 97.4 F (36.3 C) (Oral)   Ht 5\' 2"  (1.575 m)   Wt 293 lb 9.6 oz (133.2 kg)   SpO2 98%   BMI  53.70 kg/m  Gen: NAD, pleasant, cooperative HEENT: normocephalic, atraumatic, moist mucous membranes  Heart: regular rate and rhythm, no murmur Lungs: clear to auscultation bilaterally normal work of breathing  Neuro: alert, grossly nonfocal, speech normal Ext: R knee without effusion or warmth Psych: normal range of affect, well groomed, speech normal in rate and volume, normal eye contact   ASSESSMENT/PLAN:  Health maintenance:  -Last pap 03/21/14 - negative with neg HPV per scanned records, will try to get this updated in [facility] section -Offered flu shot to patient today, but pt declined.   HLD (hyperlipidemia) Lipids collected during today's visit. Will titrate statin as needed based on results  Asthma Well controlled. Continue current regimen.   Essential hypertension Well controlled. Continue current regimen.   Encounter for chronic pain management Long, frank discussion today with patient regarding risks of chronic opioids, especially in light of her intentional flexeril overdose in November in the setting of marital discord. She is very remorseful about this event, and states she will never harm herself again. She is now separated from her husband and things are more stable. She has been attending therapy.  Reviewed risks and benefits of continuing chronic narcotics with patient today. Her current morphine equivalent dose is 30mg  per day, which is in a lower risk category. She does benefit quite a bit from the medication, as it allows her to be more active, ultimately with the goal of helping her lose weight. Otherwise, is more sedentary from right knee pain. Her overdose attempt  previously did not involve her narcotics. She wishes to continue her opioid therapy.  Weighing the risks and benefits, patient and I decided together today that rather than her typical 3 month supply, I would provide one month of her medication today. She will follow up in 1 month to reassess. Will have her  follow up more closely moving forward.  Will do the following risk mitigation strategies today: -  Controlled Substance Database reviewed, findings are appropriate. - collect UDS - anticipate will be positive for hydrocodone (though could be negative, as last dose was a few days ago) - rx given for narcan to have on hand, along with handout on opioid overdose - reviewed importance of continued safe storage (using lock box)  FOLLOW UP: Follow up in 1 month for chronic pain.  [place]. [name], [place]

## 2017-01-11 NOTE — Patient Instructions (Signed)
Refilled medication today Have the narcan spray on hand at home  Checking cholesterol & vitamin D today  Follow up with me in 1 month  Be well, Dr. [name]    Opioid Overdose Introduction Opioids are substances that relieve pain by binding to pain receptors in your brain and spinal cord. Opioids include illegal drugs, such as heroin, as well as prescription pain medicines.An opioid overdose happens when you take too much of an opioid substance. This can happen with any type of opioid, including:  Heroin.  Morphine.  Codeine.  Methadone.  Oxycodone.  Hydrocodone.  Fentanyl.  Hydromorphone.  Buprenorphine. The effects of an overdose can be mild, dangerous, or even deadly. Opioid overdose is a medical emergency. What are the causes? This condition may be caused by:  Taking too much of an opioid by accident.  Taking too much of an opioid on purpose.  An error made by a health care provider who prescribes a medicine.  An error made by the pharmacist who fills the prescription order.  Using more than one substance that contains opioids at the same time.  Mixing an opioid with a substance that affects your heart, breathing, or blood pressure. These include alcohol, tranquilizers, sleeping pills, illegal drugs, and some over-the-counter medicines. What increases the risk? This condition is more likely in:  Children. They may be attracted to colorful pills. Because of a child's small size, even a small amount of a drug can be dangerous.  Elderly people. They may be taking many different drugs. Elderly people may have difficulty reading labels or remembering when they last took their medicine.  People who take an opioid on a long-term basis.  People who use:  Illegal drugs.  Other substances, including alcohol, while using an opioid.  People who have:  A history of drug or alcohol abuse.  Certain mental health conditions.  People who take opioids that are  not prescribed for them. What are the signs or symptoms? Symptoms of this condition depend on the type of opioid and the amount that was taken. Common symptoms include:  Sleepiness or difficulty waking from sleep.  Confusion.  Slurred speech.  Slowed breathing and a slow pulse.  Nausea and vomiting.  Abnormally small pupils. Signs and symptoms that require emergency treatment include:  Cold, clammy, and pale skin.  Blue lips and fingernails.  Vomiting.  Gurgling sounds in the throat.  A pulse that is very slow or difficult to detect.  Breathing that is very slow, noisy, or difficult to detect.  Limp body.  Inability to respond to speech or be awakened from sleep (stupor). How is this diagnosed? This condition is diagnosed based on your symptoms. It is important to tell your health care provider:  All of the opioidsthat you took.  When you took the opioids.  Whether you were drinking alcohol or using other substances. Your health care provider will do a physical exam. This exam may include:  Checking and monitoring your heart rate and rhythm, your breathing rate and depth, your temperature, and your blood pressure (vital signs).  Checking for abnormally small pupils.  Measuring oxygen levels in your blood. You may also have blood tests or urine tests. How is this treated? Supporting your vital signs and your breathing is the first step in treating an opioid overdose. Treatment may also include:  Giving fluids and minerals (electrolytes) through an IV tube.  Inserting a breathing tube (endotracheal tube) in your airway to help you breathe.  Giving oxygen.  Passing a tube through your nose and into your stomach (NG tube, or nasogastric tube) to wash out your stomach.  Giving medicines that:  Increase your blood pressure.  Absorb any opioid that is in your digestive system.  Reverse the effects of the opioid (naloxone).  Ongoing counseling and mental  health support if you intentionally overdosed or used an illegal drug. Follow these instructions at home:  Take over-the-counter and prescription medicines only as told by your health care provider. Always ask your health care provider about possible side effects and interactions of any new medicine that you start taking.  Keep a list of all of the medicines that you take, including over-the-counter medicines. Bring this list with you to all of your medical visits.  Drink enough fluid to keep your urine clear or pale yellow.  Keep all follow-up visits as told by your health care provider. This is important. How is this prevented?  Get help if you are struggling with:  Alcohol or drug use.  Depression or another mental health problem.  Keep the phone number of your local poison control center near your phone or on your cell phone.  Store all medicines in safety containers that are out of the reach of children.  Read the drug inserts that come with your medicines.  Do not drink alcohol when taking opioids.  Do not use illegal drugs.  Do not take opioid medicines that are not prescribed for you. Contact a health care provider if:  Your symptoms return.  You develop new symptoms or side effects when you are taking medicines. Get help right away if:  You think that you or someone else may have taken too much of an opioid. The hotline of the [facility] is (250)433-6439.  You or someone else is having symptoms of an opioid overdose.  You have serious thoughts about hurting yourself or others.  You have:  Chest pain.  Difficulty breathing.  A loss of consciousness. Opioid overdose is an emergency. Do not wait to see if the symptoms will go away. Get medical help right away. Call your local emergency services (911 in the [place] Do not drive yourself to the hospital.  This information is not intended to replace advice given to you by your health care  provider. Make sure you discuss any questions you have with your health care provider. Document Released: 12/30/2004 Document Revised: 04/29/2016 Document Reviewed: 12/05/2013  2017 Elsevier

## 2017-01-12 LAB — VITAMIN D 25 HYDROXY (VIT D DEFICIENCY, FRACTURES): VIT D 25 HYDROXY: 13 ng/mL — AB (ref 30–100)

## 2017-01-13 ENCOUNTER — Ambulatory Visit: Payer: Self-pay | Admitting: Neurology

## 2017-01-14 LAB — PAIN MGMT, PROFILE 5 W/O MEDMATCH U
AMPHETAMINES: NEGATIVE ng/mL (ref <500)
BARBITURATES: NEGATIVE ng/mL (ref <300)
BENZODIAZEPINES: NEGATIVE ng/mL (ref <100)
COCAINE METABOLITE: NEGATIVE ng/mL (ref <150)
CREATININE: 196.6 mg/dL (ref >=20.0)
Codeine: NEGATIVE ng/mL (ref <50)
HYDROCODONE: 4342 ng/mL — AB (ref <50)
HYDROMORPHONE: NEGATIVE ng/mL (ref <50)
METHADONE METABOLITE: NEGATIVE ng/mL (ref <100)
Marijuana Metabolite: NEGATIVE ng/mL (ref <20)
Morphine: NEGATIVE ng/mL (ref <50)
Norhydrocodone: NEGATIVE ng/mL (ref <50)
OPIATES: POSITIVE ng/mL — AB (ref <100)
Oxidant: NEGATIVE ug/mL (ref <200)
Oxycodone: NEGATIVE ng/mL (ref <100)
PH: 6.73 (ref 4.5–9.0)

## 2017-01-14 NOTE — Assessment & Plan Note (Addendum)
Long, frank discussion today with patient regarding risks of chronic opioids, especially in light of her intentional flexeril overdose in November in the setting of marital discord. She is very remorseful about this event, and states she will never harm herself again. She is now separated from her husband and things are more stable. She has been attending therapy.  Reviewed risks and benefits of continuing chronic narcotics with patient today. Her current morphine equivalent dose is 30mg  per day, which is in a lower risk category. She does benefit quite a bit from the medication, as it allows her to be more active, ultimately with the goal of helping her lose weight. Otherwise, is more sedentary from right knee pain. Her overdose attempt previously did not involve her narcotics. She wishes to continue her opioid therapy.  Weighing the risks and benefits, patient and I decided together today that rather than her typical 3 month supply, I would provide one month of her medication today. She will follow up in 1 month to reassess. Will have her follow up more closely moving forward.  Will do the following risk mitigation strategies today: - [place] Controlled Substance Database reviewed, findings are appropriate. - collect UDS - anticipate will be positive for hydrocodone (though could be negative, as last dose was a few days ago) - rx given for narcan to have on hand, along with handout on opioid overdose - reviewed importance of continued safe storage (using lock box)

## 2017-01-14 NOTE — Assessment & Plan Note (Signed)
Well-controlled.  Continue current regimen. 

## 2017-01-14 NOTE — Assessment & Plan Note (Signed)
- 

## 2017-01-14 NOTE — Assessment & Plan Note (Signed)
Lipids collected during today's visit. Will titrate statin as needed based on results

## 2017-01-17 ENCOUNTER — Encounter: Payer: Self-pay | Admitting: Neurology

## 2017-01-17 NOTE — Progress Notes (Unsigned)
4/16 

## 2017-01-19 ENCOUNTER — Telehealth: Payer: Self-pay | Admitting: Neurology

## 2017-01-19 NOTE — Telephone Encounter (Signed)
Patient dismissed from [facility] by Metta Clines DO  effective January 17, 2017. Dismissal letter sent out by certified / registered mail.  DAJ

## 2017-01-26 NOTE — Telephone Encounter (Signed)
Received signed domestic return receipt verifying delivery of certified letter on January 20, 2017. Article number [id number] [place]

## 2017-01-28 ENCOUNTER — Telehealth: Payer: Self-pay | Admitting: Family Medicine

## 2017-01-28 MED ORDER — CHOLECALCIFEROL 1.25 MG (50000 UT) PO TABS: 1.0000 | ORAL_TABLET | ORAL | 0 refills | Status: DC

## 2017-01-28 NOTE — Telephone Encounter (Signed)
Called patient to discuss results of vit D, which is low. Replete weekly for 12 weeks then recheck. rx sent in.  Also informed of stable lipids & appropriate UDS. She has appointment with me on March 6. Patient appreciative  [name], MD

## 2017-02-08 ENCOUNTER — Encounter: Payer: Self-pay | Admitting: Family Medicine

## 2017-02-08 ENCOUNTER — Ambulatory Visit (INDEPENDENT_AMBULATORY_CARE_PROVIDER_SITE_OTHER): Payer: Medicaid Other | Admitting: Family Medicine

## 2017-02-08 DIAGNOSIS — R51 Headache: Secondary | ICD-10-CM | POA: Diagnosis not present

## 2017-02-08 DIAGNOSIS — Z7189 Other specified counseling: Secondary | ICD-10-CM | POA: Diagnosis present

## 2017-02-08 DIAGNOSIS — G8929 Other chronic pain: Secondary | ICD-10-CM | POA: Diagnosis not present

## 2017-02-08 DIAGNOSIS — L089 Local infection of the skin and subcutaneous tissue, unspecified: Secondary | ICD-10-CM | POA: Diagnosis not present

## 2017-02-08 DIAGNOSIS — G2581 Restless legs syndrome: Secondary | ICD-10-CM | POA: Diagnosis not present

## 2017-02-08 DIAGNOSIS — I10 Essential (primary) hypertension: Secondary | ICD-10-CM | POA: Diagnosis not present

## 2017-02-08 MED ORDER — HYDROCODONE-ACETAMINOPHEN 10-325 MG PO TABS: 1.0000 | ORAL_TABLET | Freq: Three times a day (TID) | ORAL | 0 refills | Status: DC | PRN

## 2017-02-08 NOTE — Progress Notes (Signed)
Date of Visit: 02/08/2017   HPI:  Patient presents for follow up & refill of pain medication.  Currently taking hydrocodone-acetaminophen 10-325mg  three times daily for pain control of chronic back and knee pain. Pain is well controlled on this regimen and has allowed her to be more ambulatory, with a goal of losing weight. She is down 7lb since last visit. Has been walking more as a means of coping with stress, which is helpful. Denies any unwanted side effects or difficulty controlling use. Understands the risks of chronic opioids.   Risk mitigation: -Narcan: Has not yet gotten the narcan filled. Her pharmacy is having trouble getting the medication. She is going to follow up with them on having it.  -Medication storage: Continues to store her medication safely, away from her child. -Controlled substance agreement: has on file -UDS: up to date, appropriate UDS at last visit -[place] Controlled Substance Database reviewed, findings are appropriate.   She is tearful today. Reports things are difficult with her husband. They are separated but have not yet filed for divorce. Back in the fall she attempted to overdose on flexeril due to distress over marital discord. Due to this attempt, we are following her monthly now for her chronic pain, rather than every 3 months. She continues to deny any current or recent suicidal or homicidal ideation. States she needs to live for her daughter and she will never harm herself again. She is seeing a therapist and finds this helpful. She has information for the mobile crisis unit, given to her by her therapist.  ROS: See HPI.  [place]: history of hypertension, asthma, GERD, hyperlipidemia, chronic pain, uncontrolled type 2 diabetes (followed by endocrinology)  PHYSICAL EXAM: BP 136/74   Pulse 84   Temp 98 F (36.7 C) (Oral)   Ht 5\' 2"  (1.575 m)   Wt 285 lb 6.4 oz (129.5 kg)   LMP 02/04/2017 (Exact Date)   SpO2 97%   BMI 52.20 kg/m  Gen: no acute distress,  pleasant, cooeprative Psych: normal range of affect (primarily tearful), congruent with discussion, well groomed, speech normal in rate and volume, normal eye contact, no SI/HI Lungs: normal respiratory effort Neuro: speech normal  ASSESSMENT/PLAN:  Encounter for chronic pain management Again discussed risks and benefits of chronic narcotics. Benefits continue to outweigh risks. Continues to deny SI/HI.   Controlled Substance Database reviewed, findings are appropriate.  UTD on UDS and Rx agreement. Will refill again for 1 month. Continue to follow closely.  Encouraged her to get narcan filled (her pharmacy was having difficulty with this). Follow up in 1 month.   FOLLOW UP: Follow up in 1 mo for chronic pain  [name], [place]
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
## 2017-02-10 ENCOUNTER — Telehealth: Payer: Self-pay | Admitting: Family Medicine

## 2017-02-10 NOTE — Telephone Encounter (Signed)
routing

## 2017-02-10 NOTE — Telephone Encounter (Signed)
Prior Authorization received from [place] for Hydrocodone 10-325.  PA form placed in provider box for completion. Derl Barrow, RN

## 2017-02-10 NOTE — Assessment & Plan Note (Signed)
Again discussed risks and benefits of chronic narcotics. Benefits continue to outweigh risks. Continues to deny SI/HI.  [place] Controlled Substance Database reviewed, findings are appropriate.  UTD on UDS and Rx agreement. Will refill again for 1 month. Continue to follow closely.  Encouraged her to get narcan filled (her pharmacy was having difficulty with this). Follow up in 1 month.
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
## 2017-02-10 NOTE — Telephone Encounter (Signed)
Pt states she is out of medication and wants to PA taken care of ASAP. Pt states she is waiting. ep

## 2017-02-10 NOTE — Telephone Encounter (Signed)
PA is pending per [facility] Medical Provider review.  Unsure how long the review will take.  Office notes were faxed to [facility].  Derl Barrow, RN

## 2017-02-10 NOTE — Telephone Encounter (Signed)
Pt was told by pharmacy she needs prior authorization for her hydrocodone. Please send to [facility] on [address].  She is completely out of her pain meds

## 2017-02-10 NOTE — Telephone Encounter (Signed)
Prior auth completed. Will return to [place] RN. Susana Gripp J Daltin Crist, MD  

## 2017-02-10 NOTE — Telephone Encounter (Signed)
Will forward to PCP.  Momin Misko L, RN  

## 2017-02-14 NOTE — Telephone Encounter (Signed)
Received PA approval for Hydrocodone-Acet 10-325 mg via [place] [employer].  Med approved for 02/10/2017 - 08/09/2017.  [facility] pharmacy informed.  PA approval number [id number]. Derl Barrow, RN

## 2017-03-01 ENCOUNTER — Other Ambulatory Visit: Payer: Self-pay | Admitting: Family Medicine
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
## 2017-03-03 MED ORDER — ALBUTEROL SULFATE HFA 108 (90 BASE) MCG/ACT IN AERS: 1.0000 | INHALATION_SPRAY | Freq: Four times a day (QID) | RESPIRATORY_TRACT | 1 refills | Status: DC | PRN

## 2017-03-03 NOTE — Telephone Encounter (Signed)
3rd request. Cyrstal Leitz L, RN  
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
## 2017-03-09 ENCOUNTER — Ambulatory Visit (INDEPENDENT_AMBULATORY_CARE_PROVIDER_SITE_OTHER): Payer: Medicaid Other | Admitting: Family Medicine

## 2017-03-09 ENCOUNTER — Encounter: Payer: Self-pay | Admitting: Family Medicine

## 2017-03-09 DIAGNOSIS — G8929 Other chronic pain: Secondary | ICD-10-CM | POA: Diagnosis not present

## 2017-03-09 DIAGNOSIS — F32A Depression, unspecified: Secondary | ICD-10-CM

## 2017-03-09 DIAGNOSIS — F329 Major depressive disorder, single episode, unspecified: Secondary | ICD-10-CM | POA: Diagnosis not present

## 2017-03-09 MED ORDER — HYDROCODONE-ACETAMINOPHEN 10-325 MG PO TABS: 1.0000 | ORAL_TABLET | Freq: Three times a day (TID) | ORAL | 0 refills | Status: DC | PRN

## 2017-03-09 NOTE — Assessment & Plan Note (Signed)
Discussed pre visit with Dr. [name] and will refill med.

## 2017-03-09 NOTE — Patient Instructions (Signed)
I am glad things are going better. Please do get an eye exam.  Have the eye doctor send a report to Dr. [name] - she gets graded on diabetic patient whether they have had eye exams. See Dr. [name] next month.

## 2017-03-09 NOTE — Progress Notes (Signed)
   Subjective:    Patient ID: [name], female    DOB: August 15, 1976, 41 y.o.   MRN: [id number]  HPI Here for refill of her chronic pain meds.  Prior to the visit, patient's PCP, Dr. [name], told me OK to refill, patient has appointment with her next month and has used her meds appropriately.  Also aske me to check on depression.  Patient feels much improved, "Things are better at home."  She did not elaborate nor did I press - largely because her teenage daughter was in the room.      Review of Systems     Objective:   Physical Exam Appropriate and normal affect throughout visit.         Assessment & Plan:

## 2017-03-09 NOTE — Assessment & Plan Note (Signed)
Patient states she is improved and will discuss more with her PCP next visit.

## 2017-03-22 ENCOUNTER — Ambulatory Visit: Payer: Self-pay | Admitting: Endocrinology

## 2017-03-22 ENCOUNTER — Other Ambulatory Visit: Payer: Self-pay

## 2017-03-22 DIAGNOSIS — G8929 Other chronic pain: Secondary | ICD-10-CM
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
## 2017-03-22 NOTE — Telephone Encounter (Signed)
Will forward to PCP.  Mone Commisso L, RN  

## 2017-03-23 ENCOUNTER — Telehealth: Payer: Self-pay | Admitting: Family Medicine

## 2017-03-23 DIAGNOSIS — G8929 Other chronic pain: Secondary | ICD-10-CM

## 2017-03-23 MED ORDER — HYDROCODONE-ACETAMINOPHEN 10-325 MG PO TABS: 1.0000 | ORAL_TABLET | Freq: Three times a day (TID) | ORAL | 0 refills | Status: DC | PRN

## 2017-03-23 MED ORDER — ACCU-CHEK NANO SMARTVIEW W/DEVICE KIT: PACK | 0 refills | Status: DC

## 2017-03-23 MED ORDER — CARVEDILOL 3.125 MG PO TABS: 3.1250 mg | ORAL_TABLET | Freq: Two times a day (BID) | ORAL | 3 refills | Status: DC

## 2017-03-23 MED ORDER — NALOXONE HCL 4 MG/0.1ML NA LIQD: NASAL | 0 refills | Status: DC

## 2017-03-23 MED ORDER — PEN NEEDLES 31G X 6 MM MISC: 3 refills | Status: DC

## 2017-03-23 MED ORDER — BUDESONIDE-FORMOTEROL FUMARATE 160-4.5 MCG/ACT IN AERO: 2.0000 | INHALATION_SPRAY | Freq: Two times a day (BID) | RESPIRATORY_TRACT | 3 refills | Status: DC

## 2017-03-23 MED ORDER — ATORVASTATIN CALCIUM 20 MG PO TABS: 20.0000 mg | ORAL_TABLET | Freq: Every day | ORAL | 5 refills | Status: DC

## 2017-03-23 MED ORDER — LISINOPRIL 5 MG PO TABS: 5.0000 mg | ORAL_TABLET | Freq: Every day | ORAL | 1 refills | Status: DC

## 2017-03-23 MED ORDER — CHOLECALCIFEROL 1.25 MG (50000 UT) PO TABS: 1.0000 | ORAL_TABLET | ORAL | 0 refills | Status: DC

## 2017-03-23 MED ORDER — INSULIN GLARGINE 100 UNITS/ML SOLOSTAR PEN: 700.0000 [IU] | PEN_INJECTOR | SUBCUTANEOUS | 1 refills | Status: DC

## 2017-03-23 MED ORDER — CYCLOBENZAPRINE HCL 5 MG PO TABS: 5.0000 mg | ORAL_TABLET | Freq: Every day | ORAL | 1 refills | Status: DC | PRN

## 2017-03-23 MED ORDER — ALBUTEROL SULFATE HFA 108 (90 BASE) MCG/ACT IN AERS: 1.0000 | INHALATION_SPRAY | Freq: Four times a day (QID) | RESPIRATORY_TRACT | 5 refills | Status: DC | PRN

## 2017-03-23 MED ORDER — ACCU-CHEK SOFTCLIX LANCETS MISC: 12 refills | Status: DC
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
## 2017-03-30 ENCOUNTER — Ambulatory Visit: Payer: Self-pay | Admitting: Endocrinology

## 2017-04-05 ENCOUNTER — Ambulatory Visit (INDEPENDENT_AMBULATORY_CARE_PROVIDER_SITE_OTHER): Payer: Medicaid Other | Admitting: Family Medicine

## 2017-04-05 ENCOUNTER — Encounter: Payer: Self-pay | Admitting: Family Medicine

## 2017-04-05 DIAGNOSIS — J45909 Unspecified asthma, uncomplicated: Secondary | ICD-10-CM | POA: Diagnosis not present

## 2017-04-05 DIAGNOSIS — G8929 Other chronic pain: Secondary | ICD-10-CM | POA: Diagnosis present

## 2017-04-05 DIAGNOSIS — I10 Essential (primary) hypertension: Secondary | ICD-10-CM | POA: Diagnosis not present

## 2017-04-05 DIAGNOSIS — E559 Vitamin D deficiency, unspecified: Secondary | ICD-10-CM

## 2017-04-05 MED ORDER — GABAPENTIN 600 MG PO TABS: 1200.0000 mg | ORAL_TABLET | Freq: Three times a day (TID) | ORAL | 2 refills | Status: DC

## 2017-04-05 MED ORDER — CHOLECALCIFEROL 25 MCG (1000 UT) PO TABS: 1000.0000 [IU] | ORAL_TABLET | Freq: Every day | ORAL | 0 refills | Status: DC

## 2017-04-05 MED ORDER — HYDROCODONE-ACETAMINOPHEN 10-325 MG PO TABS: 1.0000 | ORAL_TABLET | Freq: Three times a day (TID) | ORAL | 0 refills | Status: DC | PRN
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
## 2017-04-06 ENCOUNTER — Ambulatory Visit: Payer: Self-pay | Admitting: Endocrinology

## 2017-04-07 MED ORDER — ACCU-CHEK SOFTCLIX LANCETS MISC: 12 refills | Status: DC

## 2017-04-07 MED ORDER — ACCU-CHEK NANO SMARTVIEW W/DEVICE KIT: PACK | 0 refills | Status: DC

## 2017-04-07 NOTE — Assessment & Plan Note (Addendum)
Blood pressure low normal on exam today, though patient reports not eating much day prior due to vomiting. Feels much better today. Currently asymptomatic. She is on coreg for tachycardia and lisinopril for renal protection. Will monitor.

## 2017-04-07 NOTE — Assessment & Plan Note (Signed)
Did not tolerate weekly 50,000 unit dosing Will switch to daily dosing in hopes for better tolerability (though this will likely not be enough to fully replete her stores)

## 2017-04-07 NOTE — Assessment & Plan Note (Signed)
Stable overall, though with more neuropathy symptoms, likely related to diabetes which is uncontrolled As mood is stable for several months, will refill norco x2 months and then follow up closely. Stop flexeril, increase gabapentin to 1200mg  three times daily.

## 2017-04-07 NOTE — Assessment & Plan Note (Signed)
Continues to be well controlled. Continue present management.

## 2017-05-10 ENCOUNTER — Telehealth: Payer: Self-pay | Admitting: *Deleted

## 2017-05-10 ENCOUNTER — Ambulatory Visit (INDEPENDENT_AMBULATORY_CARE_PROVIDER_SITE_OTHER): Payer: Medicaid Other | Admitting: Internal Medicine

## 2017-05-10 ENCOUNTER — Encounter: Payer: Self-pay | Admitting: Internal Medicine

## 2017-05-10 VITALS — BP 128/82 | HR 106 | Temp 98.0°F | Ht 62.0 in | Wt 294.2 lb

## 2017-05-10 DIAGNOSIS — Z794 Long term (current) use of insulin: Secondary | ICD-10-CM

## 2017-05-10 DIAGNOSIS — G8929 Other chronic pain: Secondary | ICD-10-CM

## 2017-05-10 DIAGNOSIS — IMO0001 Reserved for inherently not codable concepts without codable children: Secondary | ICD-10-CM

## 2017-05-10 DIAGNOSIS — E119 Type 2 diabetes mellitus without complications: Secondary | ICD-10-CM

## 2017-05-10 MED ORDER — HYDROCODONE-ACETAMINOPHEN 10-325 MG PO TABS: 1.0000 | ORAL_TABLET | Freq: Three times a day (TID) | ORAL | 0 refills | Status: DC | PRN

## 2017-05-10 NOTE — Patient Instructions (Signed)
Please return in one month for appointment with Dr. [name].
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
## 2017-05-10 NOTE — Assessment & Plan Note (Addendum)
Overall stable. From review of [place] controlled substances database, filled 30 day supply of Norco on 5/7 (Rx given on 5/1). Called to confirm with [facility] that second 30 day supply of Rx will not be filled until 6/6. Will refill Norco 30 day supply dated to not fill until 7/5. Patient to follow up with PCP for further medication refills.
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
## 2017-05-10 NOTE — Telephone Encounter (Signed)
Contacted pharmacy about meter they said it was the insurance choice to make her pay for it. They only pay for them every 2 years. Please advise. Deseree Kennon Holter, CMA

## 2017-05-11 NOTE — Telephone Encounter (Signed)
Manuela Schwartz with [employer] called to report the company is mailing pt a new meter, strips.  She just wanted dr Kandyce Rud to know

## 2017-05-13 ENCOUNTER — Ambulatory Visit (INDEPENDENT_AMBULATORY_CARE_PROVIDER_SITE_OTHER): Payer: Medicaid Other | Admitting: Endocrinology

## 2017-05-13 ENCOUNTER — Encounter: Payer: Self-pay | Admitting: Endocrinology

## 2017-05-13 VITALS — BP 128/90 | HR 102 | Ht 62.0 in | Wt 297.0 lb

## 2017-05-13 DIAGNOSIS — E119 Type 2 diabetes mellitus without complications: Secondary | ICD-10-CM | POA: Diagnosis not present

## 2017-05-13 DIAGNOSIS — IMO0001 Reserved for inherently not codable concepts without codable children: Secondary | ICD-10-CM

## 2017-05-13 DIAGNOSIS — Z794 Long term (current) use of insulin: Secondary | ICD-10-CM

## 2017-05-13 LAB — POCT GLYCOSYLATED HEMOGLOBIN (HGB A1C): HEMOGLOBIN A1C: 13.1

## 2017-05-13 MED ORDER — GLUCOSE BLOOD VI STRP: 1.0000 | ORAL_STRIP | Freq: Two times a day (BID) | 12 refills | Status: DC

## 2017-05-13 NOTE — Patient Instructions (Addendum)
Please continue the same lantus check your blood sugar twice a day.  vary the time of day when you check, between before the 3 meals, and at bedtime.  also check if you have symptoms of your blood sugar being too high or too low.  please keep a record of the readings and bring it to your next appointment here.  You can write it on any piece of paper.  please call us sooner if your blood sugar goes below 70, or if you have a lot of readings over 200.  When you can no longer get the[place], call us, so we can change to an alternative. In view of your medical condition, you should avoid pregnancy until we have decided it is safe.   A different type of diabetes blood test is requested for you today.  We'll let you know about the result. If it is good, please continue the same medications for diabetes Here is a new meter (accucheck "guide") Please come back for a follow-up appointment in 3 months.
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
## 2017-05-16 LAB — FRUCTOSAMINE: FRUCTOSAMINE: 392 umol/L — AB (ref 190–270)

## 2017-05-17 ENCOUNTER — Other Ambulatory Visit: Payer: Self-pay | Admitting: Endocrinology

## 2017-05-17 MED ORDER — INSULIN GLARGINE 100 UNITS/ML SOLOSTAR PEN: 740.0000 [IU] | PEN_INJECTOR | SUBCUTANEOUS | 1 refills | Status: DC

## 2017-05-24 ENCOUNTER — Other Ambulatory Visit: Payer: Self-pay | Admitting: Family Medicine

## 2017-05-24 NOTE — Telephone Encounter (Signed)
Pt has only a few tablets left of her gabenpetin. She was told by her pharmacy, [facility], she needed a pre authorization to get it refill.  Please advise

## 2017-05-24 NOTE — Telephone Encounter (Signed)
Prior Authorization received from [facility] pharmacy for gabapentin tablets. Formulary preferred per medicaid are the capsules.  Please change tablets to capsule per Medicaid.  Derl Barrow, RN

## 2017-05-26 MED ORDER — GABAPENTIN 300 MG PO CAPS: 1200.0000 mg | ORAL_CAPSULE | Freq: Three times a day (TID) | ORAL | 2 refills | Status: DC

## 2017-05-26 NOTE — Telephone Encounter (Signed)
Please notify patient that gabapentin changed to 300 mg caps instead of 600 mg tabs so should take 4 caps three times a day Thanks

## 2017-05-30 ENCOUNTER — Telehealth: Payer: Self-pay | Admitting: Family Medicine

## 2017-05-30 NOTE — Telephone Encounter (Signed)
Patient informed of message from MD, states that she cant swallow the capsules and there are too many of them. Informed patient that that was unlikely a good enough reason for medicaid to cover but I would forward to PCP.

## 2017-05-30 NOTE — Telephone Encounter (Signed)
See phone note

## 2017-05-30 NOTE — Telephone Encounter (Signed)
Please let patient know the capsules are what is preferred by medicaid - this is what they'll cover. Unless there is a strong reason why she can't do capsules, this is what we should use. Thanks [name], MD

## 2017-05-30 NOTE — Telephone Encounter (Signed)
Gabapentin capsules were called in, but pt needs tablets. Pt uses [facility]. ep

## 2017-05-31 NOTE — Telephone Encounter (Signed)
Okay. This is a reasonable request. The tablets come in higher doses so she actually is able to take fewer tablets than capsules.  [place], can we do a prior auth for tablets?  Thanks, [name], MD

## 2017-05-31 NOTE — Telephone Encounter (Signed)
PA approved for Gabapentin 600 mg tablets #180 tablets/30 day. PA approved until 05/31/2018. Approval number: [id number]. Derl Barrow, RN

## 2017-06-02 MED ORDER — GABAPENTIN 600 MG PO TABS: 1200.0000 mg | ORAL_TABLET | Freq: Three times a day (TID) | ORAL | 5 refills | Status: DC

## 2017-06-02 NOTE — Telephone Encounter (Signed)
Yes, please. I scanned the results and questions from the PA for future reference. PA is good for 1 year.  Derl Barrow, RN

## 2017-06-02 NOTE — Addendum Note (Signed)
Addended by: [name] on: 06/02/2017 09:38 AM   Modules accepted: Orders

## 2017-06-02 NOTE — Telephone Encounter (Signed)
[name], just to clarify. Do I need to send in a new rx for tablets? Thanks [place]

## 2017-06-02 NOTE — Telephone Encounter (Signed)
rx sent Mikhael Hendriks J Micheline Markes, MD  

## 2017-06-10 ENCOUNTER — Ambulatory Visit (INDEPENDENT_AMBULATORY_CARE_PROVIDER_SITE_OTHER): Payer: Medicaid Other | Admitting: Family Medicine

## 2017-06-10 ENCOUNTER — Telehealth: Payer: Self-pay | Admitting: Family Medicine

## 2017-06-10 ENCOUNTER — Encounter: Payer: Self-pay | Admitting: Family Medicine

## 2017-06-10 VITALS — BP 138/8 | HR 99 | Temp 97.7°F | Ht 62.0 in | Wt 296.0 lb

## 2017-06-10 DIAGNOSIS — E1142 Type 2 diabetes mellitus with diabetic polyneuropathy: Secondary | ICD-10-CM | POA: Diagnosis not present

## 2017-06-10 DIAGNOSIS — K219 Gastro-esophageal reflux disease without esophagitis: Secondary | ICD-10-CM

## 2017-06-10 DIAGNOSIS — G8929 Other chronic pain: Secondary | ICD-10-CM

## 2017-06-10 DIAGNOSIS — I10 Essential (primary) hypertension: Secondary | ICD-10-CM | POA: Diagnosis not present

## 2017-06-10 DIAGNOSIS — R0989 Other specified symptoms and signs involving the circulatory and respiratory systems: Secondary | ICD-10-CM | POA: Insufficient documentation

## 2017-06-10 MED ORDER — HYDROCODONE-ACETAMINOPHEN 10-325 MG PO TABS: 1.0000 | ORAL_TABLET | Freq: Three times a day (TID) | ORAL | 0 refills | Status: DC | PRN

## 2017-06-10 MED ORDER — CAPSAICIN 0.075 % EX CREA: 1.0000 "application " | TOPICAL_CREAM | Freq: Four times a day (QID) | CUTANEOUS | 0 refills | Status: DC | PRN

## 2017-06-10 NOTE — Telephone Encounter (Signed)
Return in about 6 weeks (around 07/22/2017).  Called spoke to patient, she will call back to schedule appt.

## 2017-06-10 NOTE — Assessment & Plan Note (Signed)
Uncontrolled. Continue gabapentin, she will call her pharmacy about getting the tablets. Also add capsaicin.

## 2017-06-10 NOTE — Assessment & Plan Note (Signed)
Well-controlled on present regimen. No changes today.

## 2017-06-10 NOTE — Assessment & Plan Note (Signed)
Faint pedal pulse palpable on left, difficulty palpating one on the right. I also had trouble finding this with the Doppler today, but have had success finding it previously with the Doppler. We will get ABIs to formally evaluate arterial supply to her legs.

## 2017-06-10 NOTE — Patient Instructions (Signed)
Follow up with me late August Refilled norco for another month - should get you through early Sept.  Try capsaicin cream for your feet. Sent this in  Getting test to check bloodflow to your feet.  Be well, Dr. [name]

## 2017-06-10 NOTE — Assessment & Plan Note (Signed)
Well-controlled on Protonix. Continue current regimen.

## 2017-06-10 NOTE — Assessment & Plan Note (Signed)
Stable. Refill of Norco provided for 1 more month. Already has a prescription for July that she will get filled today. I will see her in late August.

## 2017-06-10 NOTE — Progress Notes (Signed)
Date of Visit: 06/10/2017   HPI:  Patient presents for follow-up and medication refill.  Chronic pain: Taking Norco 10-325 mg 3 times daily. No unwanted side effects. No trouble controlling how often she is taking. She is storing it safely. Mood is well controlled. Denies any thoughts of harming herself or others. The pain medication is helping her be more active. She's been walking more.  GERD: Takes Protonix daily. Symptoms are well-controlled with this medication.  Hypertension: Takes carvedilol 3.125 mg twice daily, also lisinopril 5 mg daily. Tolerating both of these medications well. No chest pain or shortness of breath.  Diabetic neuropathy: Is having difficulty swallowing gabapentin capsules. She was not aware that we had completed a prior authorization for her to get the tablets. She will contact her pharmacy about this. Has lots of tingling in her feet, reports that her feet also feel cold much of the time. Lasts throughout the day. She wears TED hose at night which helps some with the neuropathy. Wants to know what she can do for this.  ROS: See HPI.  [place]: history of hypertension, asthma, GERD, hyperlipidemia, chronic pain, morbid obesity, depression, OSA, chronic bilateral knee pain  PHYSICAL EXAM: BP (!) 138/8   Pulse 99   Temp 97.7 F (36.5 C) (Oral)   Ht 5\' 2"  (1.575 m)   Wt 296 lb (134.3 kg)   BMI 54.14 kg/m  Gen: No acute distress, pleasant, cooperative HEENT: Normocephalic, atraumatic Lungs: Normal respiratory effort Neuro: Grossly nonfocal, speech normal, gait normal Ext: Diminished DP pulses bilaterally. Brisk capillary refill on toes bilaterally.  ASSESSMENT/PLAN:  Health maintenance:  -Has eye exam scheduled for later this month, asked her to make sure records are sent to [place] -Otherwise up-to-date on health maintenance  Diabetic neuropathy ([place]) Uncontrolled. Continue gabapentin, she will call her pharmacy about getting the tablets. Also add  capsaicin.  Decreased pedal pulses Faint pedal pulse palpable on left, difficulty palpating one on the right. I also had trouble finding this with the Doppler today, but have had success finding it previously with the Doppler. We will get ABIs to formally evaluate arterial supply to her legs.  Encounter for chronic pain management Stable. Refill of Norco provided for 1 more month. Already has a prescription for July that she will get filled today. I will see her in late August.  GERD Well-controlled on Protonix. Continue current regimen.  Essential hypertension Well-controlled on present regimen. No changes today.  FOLLOW UP: Follow up in 6 weeks for chronic pain  [name], [place]

## 2017-06-13 ENCOUNTER — Telehealth: Payer: Self-pay | Admitting: *Deleted

## 2017-06-13 NOTE — Telephone Encounter (Signed)
Patient is scheduled for ABIs tomorrow at [facility]. Please give them a call at [phone] once pre-authorization is completed.  Derl Barrow, RN

## 2017-06-13 NOTE — Telephone Encounter (Signed)
-----   Message from [name], MD sent at 06/10/2017  4:48 PM EDT ----- Can you schedule [facility] for ABI's and call her with the appointment? Order is in.  Thanks [name], MD

## 2017-06-13 NOTE — Telephone Encounter (Signed)
appt scheduled. Deseree Kennon Holter, CMA

## 2017-06-13 NOTE — Telephone Encounter (Signed)
Tia has already sent over notes for the authorization, will contact them when its done. Deseree Kennon Holter, CMA

## 2017-06-14 ENCOUNTER — Ambulatory Visit (HOSPITAL_COMMUNITY): Payer: Medicaid Other

## 2017-06-14 NOTE — Telephone Encounter (Signed)
Authorization currently still pending.

## 2017-06-14 NOTE — Telephone Encounter (Signed)
Authorization has been completed. Vascular labs has been notified.

## 2017-06-16 ENCOUNTER — Telehealth: Payer: Self-pay | Admitting: Family Medicine

## 2017-06-16 NOTE — Telephone Encounter (Signed)
Pt needs a letter stating electricity needs to stay on due to her sleep apnea machine and that she is a diabetic. Pt needs this ASAP. Please call pt when letter is ready for pick up. ep

## 2017-06-17 NOTE — Telephone Encounter (Signed)
Took care of this during her daughter's appointment this morning.  [name], MD

## 2017-06-20 ENCOUNTER — Ambulatory Visit (HOSPITAL_COMMUNITY): Admission: RE | Admit: 2017-06-20 | Payer: Medicaid Other | Source: Ambulatory Visit

## 2017-06-23 ENCOUNTER — Other Ambulatory Visit: Payer: Self-pay | Admitting: Endocrinology

## 2017-06-24 ENCOUNTER — Ambulatory Visit: Payer: Medicaid Other | Admitting: Student in an Organized Health Care Education/Training Program

## 2017-06-24 ENCOUNTER — Other Ambulatory Visit: Payer: Self-pay | Admitting: Family Medicine

## 2017-06-24 DIAGNOSIS — G8929 Other chronic pain: Secondary | ICD-10-CM

## 2017-06-24 NOTE — Telephone Encounter (Signed)
Pt will run out of pain medication on 08-11-17, PCP has no appointments until 08-19-17. Pt would like enough to get her through until the appointment on the 14th. ep

## 2017-06-24 NOTE — Telephone Encounter (Signed)
error 

## 2017-06-24 NOTE — Progress Notes (Deleted)
   CC: ***  HPI: Kashlyn Salinas is a 41 y.o. female with PMH significant for HTN, asthma, OSA, GERD, IDDM2, HLD, hx polysubstace overdose, morbid obesity, chronic pain, MDD,  who presents to [facility] today with *** of *** duration.   Overdue health maintenance due for ophthalmology exam  Review of Symptoms:  See HPI for ROS.   CC, SH/smoking status, and VS noted.  Objective: There were no vitals taken for this visit. GEN: NAD, alert, cooperative, and pleasant.*** EYE: no conjunctival injection, pupils equally round and reactive to light ENMT: normal tympanic light reflex, no nasal polyps,no rhinorrhea, no pharyngeal erythema or exudates NECK: full ROM, no thyromegaly RESPIRATORY: clear to auscultation bilaterally with no wheezes, rhonchi or rales, good effort CV: RRR, no m/r/g, no peripheral edema GI: soft, non-tender, non-distended, no hepatosplenomegaly SKIN: warm and dry, no rashes or lesions NEURO: II-XII grossly intact, normal gait, peripheral sensation intact PSYCH: AAOx3, appropriate affect  Cardiac Echo 6/207 was normal EF with mildly dilated LA, mildly dilated LV, , no wall motion abnormalities.  Assessment and plan:  No problem-specific Assessment & Plan notes found for this encounter.   No orders of the defined types were placed in this encounter.   No orders of the defined types were placed in this encounter.    Everrett Coombe, MD,MS,  PGY2 06/24/2017 6:27 AM

## 2017-07-01 ENCOUNTER — Encounter: Payer: Self-pay | Admitting: Podiatry

## 2017-07-01 ENCOUNTER — Encounter (INDEPENDENT_AMBULATORY_CARE_PROVIDER_SITE_OTHER): Payer: Self-pay

## 2017-07-01 ENCOUNTER — Ambulatory Visit (INDEPENDENT_AMBULATORY_CARE_PROVIDER_SITE_OTHER): Payer: Medicaid Other | Admitting: Podiatry

## 2017-07-01 VITALS — BP 115/80 | HR 89 | Resp 16

## 2017-07-01 DIAGNOSIS — M79604 Pain in right leg: Secondary | ICD-10-CM

## 2017-07-01 DIAGNOSIS — B351 Tinea unguium: Secondary | ICD-10-CM | POA: Diagnosis not present

## 2017-07-01 DIAGNOSIS — M79676 Pain in unspecified toe(s): Secondary | ICD-10-CM | POA: Diagnosis not present

## 2017-07-01 DIAGNOSIS — L97401 Non-pressure chronic ulcer of unspecified heel and midfoot limited to breakdown of skin: Secondary | ICD-10-CM | POA: Diagnosis not present

## 2017-07-01 DIAGNOSIS — M79605 Pain in left leg: Secondary | ICD-10-CM

## 2017-07-01 MED ORDER — CEPHALEXIN 500 MG PO CAPS: 500.0000 mg | ORAL_CAPSULE | Freq: Three times a day (TID) | ORAL | 1 refills | Status: DC

## 2017-07-03 NOTE — Progress Notes (Signed)
Subjective:    Patient ID: [name], female   DOB: 41 y.o.   MRN: [id number]   HPI patient states that she had a blister on the back of her left heel several weeks ago when she pops it and there still crusted tissue. Also states her nails become incurvated hard for her to cut and they can become painful and she has diabetes which is under poor control    ROS      Objective:  Physical Exam neurovascular status unchanged from previous visit with crusted tissue posterior plantar aspect left heel that's localized with nail disease 1-5 both feet with incurvation thickness and pain. There is no proximal edema erythema there is no drainage or odor noted     Assessment:    Probability that this is related to trauma to the left heel and it appears to be healed with nail disease 1-5 both feet that are crusted and painful     Plan:   H&P conditions reviewed. At this point I did recommend continued Neosporin usage and if there should be any drainage from the heel or any indications of systemic infection or redness she is to reappoint immediately. I did instruct on how to visualize her feet at least 2 times a day and to take her temperature daily which was normal today. I then debrided nailbeds 1-5 both feet with no iatrogenic bleeding noted

## 2017-07-04 MED ORDER — HYDROCODONE-ACETAMINOPHEN 10-325 MG PO TABS: 1.0000 | ORAL_TABLET | Freq: Three times a day (TID) | ORAL | 0 refills | Status: DC | PRN

## 2017-07-04 NOTE — Telephone Encounter (Signed)
[place] Controlled Substance Database reviewed, findings are appropriate & consistent with patient's message. She has been doing well at all of her recent visits - mood well controlled.  I will give her an additional Rx so that she can fill her medication in early September before her scheduled appointment on 9/14. Please let patient know this will be placed at the front desk for her to pick up.  [name], MD

## 2017-07-05 NOTE — Telephone Encounter (Signed)
Pt informed. Deseree Blount, CMA  

## 2017-07-08 ENCOUNTER — Other Ambulatory Visit: Payer: Self-pay | Admitting: Family Medicine

## 2017-07-08 NOTE — Telephone Encounter (Signed)
I am covering for Dr. [name].  I reviewed patient's profile in Controlled Substance Database.  It is okay for 1 time early refill.  Please call pharmacy to let them know she is going out of town and it is okay to fill early today.  Thanks!  JW

## 2017-07-08 NOTE — Telephone Encounter (Signed)
Pt is leaving today for out of town for 2 weeks.  Her prescription for hydrocodone is due to be filled on Sunday. Could dr [name] call the [facility] on [address] and authorize it to be filled today?

## 2017-07-08 NOTE — Telephone Encounter (Signed)
[place], the Pharmacist does not have the prescription.  Will wait for a call from the pharmacy regarding early filled x 1 today per Dr. Mingo Amber.  Derl Barrow, RN

## 2017-07-11 ENCOUNTER — Telehealth: Payer: Self-pay | Admitting: Family Medicine

## 2017-07-11 LAB — HM DIABETES EYE EXAM

## 2017-07-11 NOTE — Telephone Encounter (Signed)
[employer] form dropped off for at front desk for completion.  Verified that patient section of form has been completed.  Last DOS/WCC with PCP was 06/10/17.  Placed form in red team folder to be completed by clinical staff.  Carmina Miller

## 2017-07-13 NOTE — Telephone Encounter (Signed)
Form placed in PCP box 

## 2017-07-18 ENCOUNTER — Telehealth: Payer: Self-pay | Admitting: *Deleted

## 2017-07-18 ENCOUNTER — Telehealth: Payer: Self-pay | Admitting: Endocrinology

## 2017-07-18 NOTE — Telephone Encounter (Signed)
Patient fainted yesterday for 2 hours her b/s is 284 1 hour ago but 319 at the end of  Triage. No seizures h/o. Having to hold on to things to walk  Patient called [facility]

## 2017-07-18 NOTE — Telephone Encounter (Signed)
FYI  PLEASE NOTE: All timestamps contained within this report are represented as [place] Standard Time. CONFIDENTIALTY NOTICE: This fax transmission is intended only for the addressee. It contains information that is legally privileged, confidential or otherwise protected from use or disclosure. If you are not the intended recipient, you are strictly prohibited from reviewing, disclosing, copying using or disseminating any of this information or taking any action in reliance on or regarding this information. If you have received this fax in error, please notify us immediately by telephone so that we can arrange for its return to [place]. Phone: [phone], Toll-Free: [phone], Fax: [phone] Page: 1 of 1 Call Id: [id number] [place] [facility] [place] Patient Name: [name] Gender: Female DOB: Jan 11, 1976 Age: 41 Y 27 M 10 D Return Phone Number: [phone] (Primary) City/State/Zip: [name] [address] [facility] [place] Client [facility] [place] [facility] [place] Night Who Is Calling Patient / Member / Family / Caregiver Call Type Triage / Clinical Relationship To Patient Self Return Phone Number [phone] (Primary) Chief Complaint Dizziness Reason for Call Symptomatic / Request for [name] states that she is having the shakes, light headed and dizzy. Wants to know if it is related to her sugar and it was at 284. Dr. [name] at the [facility]. Nurse Assessment Guidelines Guideline Title Affirmed Question Fainting [1] Fainted > 15 minutes ago AND [2] still feels weak or dizzy Disp. Time Eilene Ghazi Time) Disposition Final User 07/16/2017 7:36:44 PM Go to ED Now Yes Martyn Ehrich, RN, [place]icia Referrals GO TO [place] Advice Given Per Guideline GO TO ED NOW: You need to be seen in the  Emergency Department. Go to the ER at ___________ [place] now. Drive carefully. * Another adult should drive. Comments User: Daphene Calamity, RN Date/Time Eilene Ghazi Time): 07/16/2017 7:33:38 PM PT is dizzy - not confused. She Fainted yesterday for 2 hours. Blood sugar 284 30 min ago. Now blood sugar is 432 User: Daphene Calamity, RN Date/Time (Eastern Time): 07/16/2017 7:36:39 PM triaged blood sugar and all questions were no - she can walk but has to hold onto things - fainted yesterday for almost 2 h. User: Daphene Calamity, RN Date/Time Eilene Ghazi Time): 07/16/2017 7:37:49 PM triaged but she is not a pt here User: Daphene Calamity, RN Date/Time Eilene Ghazi Time): 07/16/2017 7:39:47 PM we do answer for them ENdocrinology - will remake chart - she doesnt go to [place]

## 2017-07-18 NOTE — Telephone Encounter (Signed)
Patient notified of message via voicemail.

## 2017-07-18 NOTE — Telephone Encounter (Signed)
Returned patient's call. C/o feeling light-headed, dizzy, shaky and passed out 8/10. Unwitnessed but states she did not hit head. Also having to hold on to things to walk. States sx began last wed 07/13/2017. BG running in 200s  LMP end of June early July. States she could be pregnant. On her way to Urgent Care at present for this. Scheduled appt for 8/17 as she needs 3 days for mcd transpo. Hubbard Hartshorn, RN, BSN

## 2017-07-18 NOTE — Telephone Encounter (Signed)
These cbg's are not abnormal enough to explain passing out, you should see PCP about this.

## 2017-07-19 ENCOUNTER — Ambulatory Visit (HOSPITAL_COMMUNITY)
Admission: EM | Admit: 2017-07-19 | Discharge: 2017-07-19 | Disposition: A | Discharge status: Nursing Home (Includes ICF) | Payer: Medicaid Other

## 2017-07-19 NOTE — Telephone Encounter (Signed)
Forms faxed to [employer]. Forms copied for scanning in a patient's record.  Original copy placed up front for pickup.  Derl Barrow, RN

## 2017-07-19 NOTE — Telephone Encounter (Signed)
Form completed. Will return to [facility]. Should be scanned. [name], MD

## 2017-07-20 ENCOUNTER — Emergency Department (HOSPITAL_COMMUNITY)
Admission: EM | Admit: 2017-07-20 | Discharge: 2017-07-20 | Disposition: A | Discharge status: Home / Self Care | Payer: Medicaid Other | Attending: Physician Assistant | Admitting: Physician Assistant

## 2017-07-20 ENCOUNTER — Telehealth: Payer: Self-pay | Admitting: Family Medicine

## 2017-07-20 ENCOUNTER — Encounter (HOSPITAL_COMMUNITY): Payer: Self-pay | Admitting: *Deleted

## 2017-07-20 DIAGNOSIS — Z794 Long term (current) use of insulin: Secondary | ICD-10-CM | POA: Insufficient documentation

## 2017-07-20 DIAGNOSIS — R739 Hyperglycemia, unspecified: Secondary | ICD-10-CM

## 2017-07-20 DIAGNOSIS — I10 Essential (primary) hypertension: Secondary | ICD-10-CM | POA: Insufficient documentation

## 2017-07-20 DIAGNOSIS — E785 Hyperlipidemia, unspecified: Secondary | ICD-10-CM | POA: Insufficient documentation

## 2017-07-20 DIAGNOSIS — E1165 Type 2 diabetes mellitus with hyperglycemia: Secondary | ICD-10-CM | POA: Insufficient documentation

## 2017-07-20 DIAGNOSIS — Z87891 Personal history of nicotine dependence: Secondary | ICD-10-CM | POA: Insufficient documentation

## 2017-07-20 DIAGNOSIS — Z79899 Other long term (current) drug therapy: Secondary | ICD-10-CM | POA: Insufficient documentation

## 2017-07-20 DIAGNOSIS — J449 Chronic obstructive pulmonary disease, unspecified: Secondary | ICD-10-CM | POA: Insufficient documentation

## 2017-07-20 DIAGNOSIS — R42 Dizziness and giddiness: Secondary | ICD-10-CM | POA: Diagnosis present

## 2017-07-20 LAB — I-STAT BETA HCG BLOOD, ED (MC, WL, AP ONLY): I-stat hCG, quantitative: <5 m[IU]/mL (ref <5)

## 2017-07-20 LAB — BASIC METABOLIC PANEL
Anion gap: 11 (ref 5–15)
BUN: 15 mg/dL (ref 6–20)
CO2: 24 mmol/L (ref 22–32)
Calcium: 8.7 mg/dL — ABNORMAL LOW (ref 8.9–10.3)
Chloride: 97 mmol/L — ABNORMAL LOW (ref 101–111)
Creatinine, Ser: 0.81 mg/dL (ref 0.44–1.00)
GFR calc Af Amer: >60 mL/min (ref >60)
GFR calc non Af Amer: >60 mL/min (ref >60)
Glucose, Bld: 503 mg/dL (ref 65–99)
Potassium: 3.7 mmol/L (ref 3.5–5.1)
Sodium: 132 mmol/L — ABNORMAL LOW (ref 135–145)

## 2017-07-20 LAB — URINALYSIS, ROUTINE W REFLEX MICROSCOPIC
Bilirubin Urine: NEGATIVE
Glucose, UA: >=500 mg/dL — AB
Ketones, ur: NEGATIVE mg/dL
Nitrite: NEGATIVE
Protein, ur: NEGATIVE mg/dL
Specific Gravity, Urine: 1.032 — ABNORMAL HIGH (ref 1.005–1.030)
pH: 5 (ref 5.0–8.0)

## 2017-07-20 LAB — D-DIMER, QUANTITATIVE: D-Dimer, Quant: <0.27 ug/mL-FEU (ref 0.00–0.50)

## 2017-07-20 LAB — CBC
HCT: 38.5 % (ref 36.0–46.0)
Hemoglobin: 13.1 g/dL (ref 12.0–15.0)
MCH: 29.8 pg (ref 26.0–34.0)
MCHC: 34 g/dL (ref 30.0–36.0)
MCV: 87.5 fL (ref 78.0–100.0)
Platelets: 339 10*3/uL (ref 150–400)
RBC: 4.4 MIL/uL (ref 3.87–5.11)
RDW: 12.3 % (ref 11.5–15.5)
WBC: 10.2 10*3/uL (ref 4.0–10.5)

## 2017-07-20 LAB — CBG MONITORING, ED
GLUCOSE-CAPILLARY: 380 mg/dL — AB (ref 65–99)
Glucose-Capillary: 484 mg/dL — ABNORMAL HIGH (ref 65–99)
Glucose-Capillary: 401 mg/dL — ABNORMAL HIGH (ref 65–99)

## 2017-07-20 MED ORDER — SODIUM CHLORIDE 0.9 % IV BOLUS (SEPSIS)
1000.0000 mL | Freq: Once | INTRAVENOUS | Status: AC
  Administered 2017-07-20: 1000 mL via INTRAVENOUS

## 2017-07-20 MED ORDER — SODIUM CHLORIDE 0.9 % IV BOLUS (SEPSIS): 1000.0000 mL | Freq: Once | INTRAVENOUS | Status: DC

## 2017-07-20 MED ORDER — INSULIN GLARGINE 100 UNIT/ML ~~LOC~~ SOLN
80.0000 [IU] | Freq: Once | SUBCUTANEOUS | Status: AC
  Administered 2017-07-20: 80 [IU] via SUBCUTANEOUS
  Filled 2017-07-20: qty 0.8

## 2017-07-20 MED ORDER — INSULIN ASPART 100 UNIT/ML ~~LOC~~ SOLN
15.0000 [IU] | Freq: Once | SUBCUTANEOUS | Status: AC
  Administered 2017-07-20: 15 [IU] via SUBCUTANEOUS
  Filled 2017-07-20: qty 1

## 2017-07-20 NOTE — ED Provider Notes (Signed)
[facility] DEPT [facility] Provider Note   CSN: [id number] Arrival date & time: 07/20/17  4098     History   Chief Complaint Chief Complaint  Patient presents with  . Dizziness  . Loss of Consciousness    HPI [name] is a 41 y.o. female.  HPI  Patient is a well-appearing 41 year old female presenting with dizziness for the last week. Patient said her sugars have been running high. She called her endocrinologist who said this is not likely the culprit and sent her to her primary care physician. Primary physician can't see her until Friday. She says the dizziness is worse when she stands up. She reports that sometimes she has to hold onto things. She denies any shortness of breath chest pain urinary symptoms cough or cold symptoms.  Past Medical History:  Diagnosis Date  . Alveolar hypoventilation   . Anemia    not on iron pill  . Arthritis   . Asthma   . Bipolar 2 disorder ([place])   . Carpal tunnel syndrome on right    recurrent  . Cellulitis 08/2010-08/2011  . Chronic pain   . COPD (chronic obstructive pulmonary disease) ([name])    Symbicort daily and Proventil as needed  . Costochondritis   . Depression   . Diabetes mellitus 2000   Type 2, Uncontrolled.Takes Lantus daily.Fasting blood sugar runs 150  . Dizziness    occasionally  . Drug-seeking behavior   . GERD (gastroesophageal reflux disease)    takes Pantoprazole and Zantac daily  . Headache    migraine-last one about a yr ago.Topamax daily  . HLD (hyperlipidemia)    takes Atorvastatin daily  . Hypertension    takes Lisinopril and Coreg daily  . Morbid obesity ([place])   . Muscle spasm    takes Flexeril as needed  . Nocturia   . Obstructive sleep apnea   . Peripheral neuropathy    takes Gabapentin daily  . Pneumonia    "walking" several yrs ago and as a baby  . Rectal fissure   . Restless leg   . Urinary frequency   . Varicose veins    Right medial thigh and Left leg     Patient Active Problem List   Diagnosis Date Noted  . Decreased pedal pulses 06/10/2017  . Adjustment disorder with mixed anxiety and depressed mood 11/04/2016  . History of medication noncompliance 11/04/2016  . Drug overdose 10/30/2016  . Polysubstance overdose 10/29/2016  . Unilateral primary osteoarthritis, right knee 10/22/2016  . Diabetic neuropathy ([place]) 07/14/2016  . De Quervain's tenosynovitis, bilateral 11/01/2015  . Vitamin D deficiency 09/05/2015  . Recurrent candidiasis of vagina 09/05/2015  . Varicose veins of leg with complications 11/91/4782  . Neuropathy, intercostal nerve 12/17/2014  . Rectal itching 11/17/2014  . Restless leg syndrome 10/17/2014  . Chronic sinusitis 07/18/2014  . Headache 07/15/2014  . Vision, loss, sudden 07/12/2014  . Urge incontinence 10/15/2013  . Encounter for chronic pain management 06/30/2013  . Low back pain 01/31/2013  . HLD (hyperlipidemia) 11/19/2012  . Chest pain 06/27/2012  . Right carpal tunnel syndrome 09/01/2011  . Bilateral knee pain 09/01/2011  . Insulin dependent diabetes mellitus ([place]) 05/22/2008  . Morbid obesity ([place]) 05/22/2008  . 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 Depression 05/22/2008  . Obstructive sleep apnea 05/22/2008  . Essential hypertension 05/22/2008  . Asthma 05/22/2008  . GERD 05/22/2008    Past Surgical History:  Procedure Laterality Date  . CARPAL TUNNEL RELEASE    . CESAREAN SECTION    .  KNEE ARTHROSCOPY Right 07/17/2010  . LEFT HEART CATHETERIZATION WITH CORONARY ANGIOGRAM N/A 07/27/2012   Procedure: LEFT HEART CATHETERIZATION WITH CORONARY ANGIOGRAM;  Surgeon: Sherren Mocha, MD;  Location: [facility] CATH LAB;  Service: Cardiovascular;  Laterality: N/A;  . left knee surgery     screws she thinks  . MASS EXCISION N/A 06/29/2013   Procedure:  WIDE LOCAL EXCISION OF POSTERIOR NECK ABSCESS;  Surgeon: Ralene Ok, MD;  Location: [facility];  Service: General;  Laterality: N/A;    OB History    Gravida Para Term Preterm AB  Living   _0 SAB TAB Ectopic Multiple Live Births   1               Home Medications    Prior to Admission medications   Medication Sig Start Date End Date Taking? Authorizing Provider  ACCU-CHEK GUIDE test strip USE T0 CHECK BLOOD SUGAR 3 TIMES DAILY 06/23/17  Yes [name], MD  ACCU-CHEK SOFTCLIX LANCETS lancets Use as instructed to check blood sugar 3 times per day 04/07/17  Yes [name], MD  albuterol (PROVENTIL HFA;VENTOLIN HFA) 108 (90 Base) MCG/ACT inhaler Inhale 1-2 puffs into the lungs every 6 (six) hours as needed for wheezing or shortness of breath. 03/23/17  Yes [name], MD  atorvastatin (LIPITOR) 20 MG tablet Take 1 tablet (20 mg total) by mouth daily. 03/23/17  Yes [name], MD  Blood Glucose Monitoring Suppl (ACCU-CHEK NANO SMARTVIEW) w/Device KIT Use to check blood sugar 3 times daily 04/07/17  Yes [name], MD  budesonide-formoterol [facility]) 160-4.5 MCG/ACT inhaler Inhale 2 puffs into the lungs 2 (two) times daily. 03/23/17  Yes [name], MD  carvedilol (COREG) 3.125 MG tablet Take 1 tablet (3.125 mg total) by mouth 2 (two) times daily with a meal. 03/23/17  Yes [name], MD  Cholecalciferol 1000 units tablet Take 1 tablet (1,000 Units total) by mouth daily. 04/05/17  Yes [name], MD  cyclobenzaprine (FLEXERIL) 5 MG tablet Take 1 tablet (5 mg total) by mouth daily as needed for muscle spasms. 03/23/17  Yes [name], MD  gabapentin (NEURONTIN) 600 MG tablet Take 2 tablets (1,200 mg total) by mouth 3 (three) times daily. 06/02/17  Yes [name], MD  HYDROcodone-acetaminophen (NORCO) 10-325 MG tablet Take 1 tablet by mouth every 8 (eight) hours as needed for moderate pain. 07/04/17  Yes [name], MD  ibuprofen (ADVIL,MOTRIN) 600 MG tablet Take 600 mg by mouth every 8 (eight) hours as needed for pain.   Yes [provider]  insulin glargine (LANTUS)  100 unit/mL SOPN Inject 7.4 mLs (740 Units total) into the skin every morning. 05/17/17  Yes [name], MD  Insulin Pen Needle (PEN NEEDLES) 31G X 6 MM MISC Use to inject insulin daily 03/23/17  Yes [name], MD  lisinopril (PRINIVIL,ZESTRIL) 5 MG tablet Take 1 tablet (5 mg total) by mouth daily. 03/23/17  Yes [name], MD  naloxone [facility]) nasal spray 4 mg/0.1 mL 1 spray into the nose as needed for overdose Patient taking differently: Place 1 spray into the nose once as needed (overdose). 1 spray into the nose as needed for overdose 03/23/17  Yes [name], MD  pantoprazole (PROTONIX) 40 MG tablet Take 40 mg by mouth daily.   Yes [provider]  capsicum (ZOSTRIX) 0.075 % topical cream Apply 1 application topically 4 (four)  times daily as needed. Patient not taking: Reported on 07/20/2017 06/10/17   [name], MD  cephALEXin (KEFLEX) 500 MG capsule Take 1 capsule (500 mg total) by mouth 3 (three) times daily. Patient not taking: Reported on 07/20/2017 07/01/17   Wallene Huh, DPM    Family History Family History  Problem Relation Age of Onset  . Diabetes Mother   . Hyperlipidemia Mother   . Depression Mother   . Varicose Veins Mother   . Heart attack Paternal Uncle   . Heart disease Paternal Grandmother   . Heart attack Paternal Grandmother   . Heart attack Paternal Grandfather   . Heart disease Paternal Grandfather   . Heart attack Father     Social History Social History  Substance Use Topics  . Smoking status: Former Smoker    Packs/day: 0.30    Years: 0.30    Types: Cigarettes    Quit date: 12/06/1993  . Smokeless tobacco: Never Used  . Alcohol use No     Allergies   Kiwi extract and Nubain [nalbuphine hcl]   Review of Systems Review of Systems  Constitutional: Negative for activity change.  HENT: Negative for congestion.   Respiratory: Negative for chest tightness and shortness of breath.   Cardiovascular:  Negative for chest pain.  Gastrointestinal: Negative for abdominal pain.  Genitourinary: Negative for dysuria.  Neurological: Positive for dizziness. Negative for headaches.  All other systems reviewed and are negative.    Physical Exam Updated Vital Signs BP 94/82   Pulse 94   Temp 97.7 F (36.5 C) (Oral)   Resp 15   Ht _0  (1.575 m)   Wt 134.3 kg (296 lb)   LMP 07/03/2017   SpO2 99%   BMI 54.14 kg/m   Physical Exam  Constitutional: She is oriented to person, place, and time. She appears well-developed and well-nourished.  obese  HENT:  Head: Normocephalic and atraumatic.  Eyes: Right eye exhibits no discharge.  Cardiovascular: Normal rate, regular rhythm and normal heart sounds.   No murmur heard. Pulmonary/Chest: Effort normal and breath sounds normal. She has no wheezes. She has no rales.  Abdominal: Soft. She exhibits no distension. There is no tenderness.  Neurological: She is oriented to person, place, and time. She displays normal reflexes. No cranial nerve deficit or sensory deficit. She exhibits normal muscle tone. Coordination normal.  Equal strength bilaterally upper and lower extremities negative pronator drift. Normal sensation bilaterally. Speech comprehensible, no slurring. Facial nerve tested and appears grossly normal. Alert and oriented 3.   Ambulated without unsteadiness  Skin: Skin is warm and dry. She is not diaphoretic.  Psychiatric: She has a normal mood and affect.  Nursing note and vitals reviewed.    ED Treatments / Results  Labs (all labs ordered are listed, but only abnormal results are displayed) Labs Reviewed  BASIC METABOLIC PANEL - Abnormal; Notable for the following:       Result Value   Sodium 132 (*)    Chloride 97 (*)    Glucose, Bld 503 (*)    Calcium 8.7 (*)    All other components within normal limits  URINALYSIS, ROUTINE W REFLEX MICROSCOPIC - Abnormal; Notable for the following:    Color, Urine STRAW (*)    Specific  Gravity, Urine 1.032 (*)    Glucose, UA >=500 (*)    Hgb urine dipstick SMALL (*)    Leukocytes, UA SMALL (*)    Bacteria, UA RARE (*)  Squamous Epithelial / LPF 0-5 (*)    All other components within normal limits  CBG MONITORING, ED - Abnormal; Notable for the following:    Glucose-Capillary 484 (*)    All other components within normal limits  CBG MONITORING, ED - Abnormal; Notable for the following:    Glucose-Capillary 401 (*)    All other components within normal limits  CBG MONITORING, ED - Abnormal; Notable for the following:    Glucose-Capillary 380 (*)    All other components within normal limits  CBC  D-DIMER, QUANTITATIVE (NOT AT [facility])  I-STAT BETA HCG BLOOD, ED ([facility], [facility], [facility] ONLY)    EKG  EKG Interpretation  Date/Time:  Wednesday July 20 2017 07:57:48 EDT Ventricular Rate:  97 PR Interval:  158 QRS Duration: 96 QT Interval:  378 QTC Calculation: 480 R Axis:   82 Text Interpretation:  Sinus rhythm with frequent Premature ventricular complexes Low voltage QRS Prolonged QT Abnormal ECG No significant change since last tracing Confirmed by Thomasene Lot, [place] ([id number]) on 07/20/2017 8:19:10 AM       Radiology No results found.  Procedures Procedures (including critical care time)  Medications Ordered in ED Medications  sodium chloride 0.9 % bolus 1,000 mL (0 mLs Intravenous Stopped 07/20/17 1039)  insulin glargine (LANTUS) injection 80 Units (80 Units Subcutaneous Given 07/20/17 1041)  insulin aspart (novoLOG) injection 15 Units (15 Units Subcutaneous Given 07/20/17 1041)  sodium chloride 0.9 % bolus 1,000 mL (0 mLs Intravenous Stopped 07/20/17 1159)     Initial Impression / Assessment and Plan / ED Course  I have reviewed the triage vital signs and the nursing notes.  Pertinent labs & imaging results that were available during my care of the patient were reviewed by me and considered in my medical decision making (see chart for details).    Patient is an  obese 41 year old female with history of diabetes hypertension presenting today with dizziness. This going on for the last week. Patient reports her sugars up and running high, in the 400s rather than the usual 200s. Today patient's physical exam is reassuring. She has no neurologic changes. She steadily ambulates. Did consider central cause for this given its nebulous and insidious presentation. However she has no neurologic changes and with the young age of 28,  stroke less likely.  3:06 PM I went to give patient's home insulin dose. Patient reports she takes 740 units of Lantus. This is her normal home dose. I double checked with pharmacy, and patient's endocrinologist, and the pharmacy that she fills her  prescription. She indeed takes 2 and half pens of Lantus a day. In talking pharmacy appears that really one can only absorb about 100 units at anyone site. We will give her smaller dose of Lantus and then some fast acting to help her sugar here.   Gave 2 L of fluid and insulin. Patient symtpoms resolved. Pt called her endocringologist while in the department and made follow up. She will monitor sugars and follow up shortly. Return precautions exopressed.   Final Clinical Impressions(s) / ED Diagnoses   Final diagnoses:  Hyperglycemia    New Prescriptions Discharge Medication List as of 07/20/2017 12:01 PM       Macarthur Critchley, MD 07/21/17 1506

## 2017-07-20 NOTE — ED Notes (Signed)
CBG  401 

## 2017-07-20 NOTE — Discharge Instructions (Addendum)
We think his symptoms are from your high blood sugar. We want you to follow-up with your endocrinologist. Please return with any concerns.

## 2017-07-20 NOTE — ED Notes (Signed)
Dr. Thomasene Lot at bedside   Dr. Thomasene Lot made aware of glucose.

## 2017-07-20 NOTE — Telephone Encounter (Signed)
Pls let patient know that Dr [name] is out this week and will address next week.  Would recommend she contact her endocrinologist to discuss the possible confusion.  Thanks  [place]

## 2017-07-20 NOTE — Telephone Encounter (Signed)
Pt would like a referral for a new Endo, pt is currently in ED and was told that her Endo doctor told her the wrong way to inject her insulin and pt does not want to go back. Pt is currently seeing. Dr. Loanne Drilling. ep

## 2017-07-20 NOTE — ED Triage Notes (Signed)
Pt states since last week she has been experiencing fatigue and dizziness.  States Friday she was unconscious for 2 hours.  States she fell, but did not hit her head.

## 2017-07-20 NOTE — ED Notes (Signed)
Pt ambulated self efficiently to restroom with no difficulty. 

## 2017-07-22 ENCOUNTER — Encounter: Payer: Self-pay | Admitting: Family Medicine

## 2017-07-22 ENCOUNTER — Telehealth: Payer: Self-pay | Admitting: Pharmacist

## 2017-07-22 ENCOUNTER — Ambulatory Visit (INDEPENDENT_AMBULATORY_CARE_PROVIDER_SITE_OTHER): Payer: Medicaid Other | Admitting: Family Medicine

## 2017-07-22 VITALS — BP 122/84 | HR 91 | Temp 98.1°F | Ht 62.0 in | Wt 298.6 lb

## 2017-07-22 DIAGNOSIS — Z794 Long term (current) use of insulin: Secondary | ICD-10-CM | POA: Diagnosis not present

## 2017-07-22 DIAGNOSIS — E119 Type 2 diabetes mellitus without complications: Secondary | ICD-10-CM

## 2017-07-22 DIAGNOSIS — IMO0001 Reserved for inherently not codable concepts without codable children: Secondary | ICD-10-CM

## 2017-07-22 LAB — GLUCOSE, POCT (MANUAL RESULT ENTRY): POC Glucose: 383 mg/dl — AB (ref 70–99)

## 2017-07-22 MED ORDER — INSULIN DEGLUDEC 200 UNIT/ML ~~LOC~~ SOPN: 100.0000 [IU] | PEN_INJECTOR | Freq: Every morning | SUBCUTANEOUS | 0 refills | Status: DC

## 2017-07-22 NOTE — Telephone Encounter (Signed)
**  After Hours/ Emergency Line Call*  Received a call to report that Makaylin Carlo is having hyperglycemia to 400. Patient reports she was seen in ED on 8/15 where she was advised to take her 740 units of lantus daily in 100 unit increments for better absorption.  She reports she took 7 x 100u doses and 1x40u dose of Lantus this AM at 10:30AM.  She checked her blood sugar just now at and it was 400.  - Denying abdominal pain, chest pain, N/V/D, no confusion. No changes in urination or thirst.   - Patient notes that she has a clinic visit at 8:25 am.  - She does not have any short acting insulin at home.   I am hesitant to re-dose her insulin over the phone because she only has access to long-acting insulin, and I do not have a sense of whether she has brittle sugars.  Recommended that patient keep her AM appointment, given that it is only a few hours from now. I explained reasons we'd want her to come in to be evaluated sooner, in particular if she develops abdominal pain, nausea, vomiting, diarrhea, changes in thirst or urination, or confusion. Red flags discussed.  Will forward to PCP.  Everrett Coombe, MD PGY-2, [facility]

## 2017-07-22 NOTE — Assessment & Plan Note (Addendum)
Poorly controlled with most recent A1c 13.1 on 05/13/2017.   -Her diabetes is managed by Endocrinology - Dr. Loanne Drilling [facility]).  CBGs checked at home usually run 200-400s.  Reports she takes 740 units Lantus daily however it is possible she may not be administering this correctly as around 100 units typically is the maximum that can be absorbed in one site of injection.   -CBG today in office 383.  Administered 100 units Tresiba to start as unsure if she is brittle.   -Sample Tresiba pen given today and Dr. Valentina Lucks to follow up with phone call this evening to give further recommendations based on evening CBG. Will determine if Tresiba vs Nelva Nay is a better option for her given Tyler Aas is Medicaid nonpreferred.  -Pt advised to monitor CBGs closely at home and follow up in clinic on Monday 8/20 with Dr. Valentina Lucks.

## 2017-07-22 NOTE — Telephone Encounter (Signed)
F/U of AM visit and administration of Tresiba 100 unit dose in office.   Patient checked CBG while I was on the phone and reported a reading of 286 (reduced from the in-office reading 383).     Shared plan to continue dosing of Tresiba 100 units once daily in the AM over the weekend and return to the office on Monday AM to see me   Advised if she had a CBG, 100 she should reduce her Tyler Aas to 90 units once daily in the AM.   Patient verbalized understanding of the treatment plan.   Reviewed plan with Dr. Erin Hearing - AM attending.

## 2017-07-22 NOTE — Assessment & Plan Note (Signed)
>>  ASSESSMENT AND PLAN FOR INSULIN DEPENDENT TYPE 2 DIABETES MELLITUS ([name]) WRITTEN ON 07/22/2017  4:13 PM BY Lovenia Kim, MD  Poorly controlled with most recent A1c 13.1 on 05/13/2017.   -Her diabetes is managed by Endocrinology - Dr. Loanne Drilling [facility]).  CBGs checked at home usually run 200-400s.  Reports she takes 740 units Lantus daily however it is possible she may not be administering this correctly as around 100 units typically is the maximum that can be absorbed in one site of injection.   -CBG today in office 383.  Administered 100 units Tresiba to start as unsure if she is brittle.   -Sample Tresiba pen given today and Dr. Valentina Lucks to follow up with phone call this evening to give further recommendations based on evening CBG. Will determine if Tresiba vs Nelva Nay is a better option for her given Tyler Aas is Medicaid nonpreferred.  -Pt advised to monitor CBGs closely at home and follow up in clinic on Monday 8/20 with Dr. Valentina Lucks.

## 2017-07-22 NOTE — Patient Instructions (Addendum)
You were seen today in clinic for high blood sugars. You received 100 units of Tresiba while here to bring your sugars down.  Dr. Valentina Lucks met with you and will follow you for your insulin management.  Please keep your appointment for 9 AM on Monday 8/20 with him for this.  In the meantime, continue to check and log your blood sugars.

## 2017-07-22 NOTE — Progress Notes (Signed)
Subjective:   Patient ID: [name]    DOB: [birth year], 41 y.o. female   MRN: [id number]  CC: f/u hyperglycemia   HPI: [name] is a 41 y.o. female who presents to clinic today for ED followup.   Hyperglycemia: Follows with Dr. Loanne Drilling [facility]ocr[facility]ology) as outpatient.  She states she used to take a long-acting and short-acting insulin however was then taken off her short-acting.  She currently takes 740 Units of Lantus daily.   -She has been checking her blood sugars about 3x a day and on days she feels sick she checks 4x a day. Usually runs between 3-400s.   -CBG yesterday AM 284, 1AM 400 -Most recent A1c is 13.1.  -Seen in the ED on 8/15 for hyperglycemia and was found to have CBG 503 with normal AG and urine. Her home lantus dose was confirmed by ED provider who called her pharmacy as well as Endocrinologist.  Pharmacy explained to ED provider that only about 100 units max can be properly absorbed at any one injection site so she feels she was likely taking her insulin incorrectly if this is the case.  She would like referral to see another endocrinologist if possible.   -Denies nausea, vomiting, abdominal pain.   -Endorses dizziness, lightheadedness.  Passed out on 8/10 but does not think this is due to HYPOglycemic episode.  -No prior history of hospitalization for DKA.    ROS: Denies fevers, chills, nausea, vomiting, diarrhea.  Denies abdominal pain, CP, SOB.  [place]: Pertinent past medical, surgical, family, and social history were reviewed and updated as appropriate. Smoking status reviewed. Medications reviewed.  Objective:   BP 122/84 (BP Location: Right Wrist, Patient Position: Sitting, Cuff Size: Normal)   Pulse 91   Temp 98.1 F (36.7 C) (Oral)   Ht 5\' 2"  (1.575 m)   Wt 298 lb 9.6 oz (135.4 kg)   LMP 07/03/2017   SpO2 98%   BMI 54.61 kg/m  Vitals and nursing note reviewed.  General: obese 41 yo female, well nourished, NAD  HEENT: NCAT, MMM  CV: RRR no MRG   Lungs: CTAB, normal effort  Abdomen: soft, NTND, +bs  Skin: warm, dry, no rashes Extremities: warm and well perfused   Assessment & Plan:   Insulin dependent diabetes mellitus ([place]) Poorly controlled with most recent A1c 13.1 on 05/13/2017.   -Her diabetes is managed by Endocrinology - Dr. Loanne Drilling ([place]).  CBGs checked at home usually run 200-400s.  Reports she takes 740 units Lantus daily however it is possible she may not be administering this correctly as around 100 units typically is the maximum that can be absorbed in one site of injection.   -CBG today in office 383.  Administered 100 units Tresiba to start as unsure if she is brittle.   -Sample Tresiba pen given today and Dr. Valentina Lucks to follow up with phone call this evening to give further recommendations based on evening CBG. Will determine if Tresiba vs Nelva Nay is a better option for her given Tyler Aas is Medicaid nonpreferred.  -Pt advised to monitor CBGs closely at home and follow up in clinic on Monday 8/20 with Dr. Valentina Lucks.     Orders Placed This Encounter  Procedures  . Glucose (CBG)   Meds ordered this encounter  Medications  . Insulin Degludec (TRESIBA FLEXTOUCH) 200 UNIT/ML SOPN    Sig: Inject 100 Units into the skin every morning.    Dispense:  1 pen    Refill:  0  Order Specific Question:   Lot Number?    Answer:   [id number]    Order Specific Question:   Expiration Date?    Answer:   07/06/2019    Order Specific Question:   Quantity    Answer:   1   Follow up: 07/25/2017   Lovenia Kim, MD [place], PGY-1 07/22/2017 4:14 PM

## 2017-07-25 ENCOUNTER — Ambulatory Visit: Payer: Medicaid Other | Admitting: Pharmacist

## 2017-07-25 ENCOUNTER — Telehealth: Payer: Self-pay | Admitting: Pharmacist

## 2017-07-25 NOTE — Telephone Encounter (Signed)
Noted and agree. 

## 2017-07-25 NOTE — Telephone Encounter (Signed)
Phone call follow-up of CBGs and initiation of Tresiba insulin last week.   Patient reports CBG last eveing of 160 and reported a CBG of 280 this AM.  She noted that her fasting CBGs have been in the 250-290 range all weekend.   Nocturia decreased to once last night.   Instructed to increase Tresiba to 110 units this AM and continue taking 110 units each morning until she returns to clinic on Thursday to see me in Rx Clinic at 2:30 - appt made.  She denies trials of any other agents (GLPs and SGLT2 agents were not recognized by name as anything she has tried).   Consider initiation of GLP (possibly alternative to Tanzeum - no longer available).

## 2017-07-26 ENCOUNTER — Other Ambulatory Visit: Payer: Self-pay | Admitting: Endocrinology

## 2017-07-28 ENCOUNTER — Ambulatory Visit (INDEPENDENT_AMBULATORY_CARE_PROVIDER_SITE_OTHER): Payer: Medicaid Other | Admitting: Internal Medicine

## 2017-07-28 ENCOUNTER — Telehealth: Payer: Self-pay

## 2017-07-28 ENCOUNTER — Encounter: Payer: Self-pay | Admitting: Pharmacist

## 2017-07-28 ENCOUNTER — Telehealth: Payer: Self-pay | Admitting: Endocrinology

## 2017-07-28 VITALS — BP 143/87 | HR 83 | Ht 62.0 in | Wt 298.2 lb

## 2017-07-28 DIAGNOSIS — E1142 Type 2 diabetes mellitus with diabetic polyneuropathy: Secondary | ICD-10-CM

## 2017-07-28 DIAGNOSIS — R55 Syncope and collapse: Secondary | ICD-10-CM

## 2017-07-28 MED ORDER — LIRAGLUTIDE 18 MG/3ML ~~LOC~~ SOPN: 1.8000 mg | PEN_INJECTOR | Freq: Every day | SUBCUTANEOUS | 1 refills | Status: DC

## 2017-07-28 MED ORDER — INSULIN DEGLUDEC 200 UNIT/ML ~~LOC~~ SOPN: 110.0000 [IU] | PEN_INJECTOR | Freq: Every day | SUBCUTANEOUS | 1 refills | Status: DC

## 2017-07-28 MED ORDER — INSULIN DEGLUDEC 200 UNIT/ML ~~LOC~~ SOPN: 110.0000 [IU] | PEN_INJECTOR | Freq: Every morning | SUBCUTANEOUS | 0 refills | Status: DC

## 2017-07-28 MED ORDER — INSULIN PEN NEEDLE 32G X 6 MM MISC: 1.0000 [IU] | Freq: Three times a day (TID) | 11 refills | Status: DC

## 2017-07-28 MED ORDER — INSULIN ASPART 100 UNIT/ML FLEXPEN: 30.0000 [IU] | PEN_INJECTOR | Freq: Every day | SUBCUTANEOUS | 1 refills | Status: DC

## 2017-07-28 MED ORDER — INSULIN ASPART 100 UNIT/ML FLEXPEN
30.0000 [IU] | PEN_INJECTOR | Freq: Every day | SUBCUTANEOUS | 0 refills | Status: DC
Start: 2017-07-28 — End: 2017-08-05

## 2017-07-28 NOTE — Telephone Encounter (Signed)
please call patient: We got refill request.  How many units are you taking?  How are cbg's doing?

## 2017-07-28 NOTE — Progress Notes (Signed)
Subjective:    Shellby Schlink - 41 y.o. female MRN [id number]  Date of birth: 10-27-1976  HPI  Makayle Krahn is here for pharmacy visit. Was asked to see patient due to lightheadedness. Patient reports several weeks of intermittent lightheadedness. Reports these episodes typically occur in the evening. They last for several minutes. She feels like she is going to pass out and actually had one episode of LOC two nights ago. LOC preceded by prodrome with sensation of tunnel vision. Denies hitting head during event. Was oriented when she woke up and denies bowel/bladder incontinence, biting tongue, or rhythmic movements. She is unable to associate the lightheadedness with any particular activity such as eating, bowel movements, etc. She has global sensation of weakness but does not have focal weakness. She denies room spinning sensation or tinnitus. Patient has history of uncontrolled T2DM. CBGs typically in the 400-500 range. Most recent CBG 280 and does not remember the last time she had a number <300. She denies significant cardiac history but is followed by cardiologist. No sensation of heart skipping beats or fluttering.   -  reports that she quit smoking about 23 years ago. Her smoking use included Cigarettes. She has a 0.09 pack-year smoking history. She has never used smokeless tobacco. - Review of Systems: Per HPI. - Past Medical History: Patient Active Problem List   Diagnosis Date Noted  . Decreased pedal pulses 06/10/2017  . Adjustment disorder with mixed anxiety and depressed mood 11/04/2016  . History of medication noncompliance 11/04/2016  . Drug overdose 10/30/2016  . Polysubstance overdose 10/29/2016  . Unilateral primary osteoarthritis, right knee 10/22/2016  . Diabetic neuropathy ([place]) 07/14/2016  . De Quervain's tenosynovitis, bilateral 11/01/2015  . Vitamin D deficiency 09/05/2015  . Recurrent candidiasis of vagina 09/05/2015  . Varicose veins of leg with complications 90/30/0923   . Neuropathy, intercostal nerve 12/17/2014  . Rectal itching 11/17/2014  . Restless leg syndrome 10/17/2014  . Chronic sinusitis 07/18/2014  . Headache 07/15/2014  . Vision, loss, sudden 07/12/2014  . Urge incontinence 10/15/2013  . Encounter for chronic pain management 06/30/2013  . Low back pain 01/31/2013  . HLD (hyperlipidemia) 11/19/2012  . Chest pain 06/27/2012  . Right carpal tunnel syndrome 09/01/2011  . Bilateral knee pain 09/01/2011  . Insulin dependent diabetes mellitus ([place]) 05/22/2008  . Morbid obesity ([place]) 05/22/2008  . 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 Depression 05/22/2008  . Obstructive sleep apnea 05/22/2008  . Essential hypertension 05/22/2008  . Asthma 05/22/2008  . GERD 05/22/2008   - Medications: reviewed and updated   Objective:   Physical Exam BP (!) 143/87 (BP Location: Right Arm, Patient Position: Sitting, Cuff Size: Large)   Pulse 83   Ht 5\' 2"  (1.575 m)   Wt 298 lb 3.2 oz (135.3 kg)   LMP 07/03/2017   BMI 54.54 kg/m  Gen: NAD, alert, cooperative with exam, well-appearing, morbidly obese  HEENT: NCAT, PERRL, clear conjunctiva, oropharynx clear CV: RRR, good S1/S2, no murmur Resp: CTABL, no wheezes, non-labored Neuro: CN II-XII grossly intact. Strength 5/5 in all extremities. Sensation intact in all extremities. Gait normal.  Psych: good insight, alert and oriented     Assessment & Plan:   Syncope and Lightheadedness  Differential remains broad. Low suspicion for CVA or brain mass as patient does not have have any focal neurological symptoms and neuro exam benign. EKG obtained at recent ED visit only significant for PVCs and low suspicion that PVCs in the setting of otherwise normal cardiac function  would cause syncope. Patient declined repeat EKG today. Orthostatic vitals were negative. Syncope sounds vasovagal in nature. May be related to new set point for blood glucose. Will check some labwork today to ensure no other  abnormalities causing symptoms particularly the thyroid given her history of uncontrolled T2DM. Counseled patient to stand up slowly. Return in one week to see PCP if still symptomatic. Follow up with pharmacy in 1 month for further adjustment of insulin.  - Basic Metabolic Panel - TSH - CBC   Phill Myron, D.O. 07/28/2017, 3:30 PM PGY-3, [place]

## 2017-07-28 NOTE — Telephone Encounter (Signed)
Called and LVM advising patient to call back to advise on how many units she is taking and how the sugars have been doing. Gave call back number.

## 2017-07-28 NOTE — Telephone Encounter (Signed)
Thanks for following up with her Dr. Valentina Lucks. Appreciate your help!

## 2017-07-28 NOTE — Patient Instructions (Addendum)
Thank you for coming in today!  1. Continue Tresiba 110 units injected once daily  2. Start Novolog 30 units injected with your biggest meal.  3. Start Victoza 0.6 mg injected once daily for 1 week, then increase to 1.2 mg once daily for 1 week, then increase to 1.8 mg once daily for 1 week. You may increase this medicine more slowly than every week if you experience nausea.   4. Call the clinic if you see blood glucose less than 100.   Follow up with your PCP.

## 2017-07-28 NOTE — Progress Notes (Signed)
    S:     Chief Complaint  Patient presents with  . Medication Management    diabetes    Patient arrives in fair spirits, ambulating without assistance accompanied by daughter.  Presents for diabetes evaluation, education, and management at the request of Dr. Reesa Chew. Patient was referred on 07/22/17.  Patient was last seen by Primary Care Provider (Dr. [name]) on 06/10/17.   Patient reports she has not been able to check blood sugar in last 2 days due to battery issues with meter. She reports episodes of fainting/passing out nearly daily. She has gone to the hospital for this issue on 8/15 and an EKG was performed at that time.   Patient reports Diabetes was diagnosed in 2000.   Family/Social History: mother, daughter with diabetes  Patient reports adherence with medications.  Current diabetes medications include: insulin degludec 110 units daily Current hypertension medications include: carvedilol 3.125 mg BID   Reports walking a little bit in the evenings  Patient denies hypoglycemic events.  Patient reported dietary habits: Eats 1 meal per day in the evening   Patient denies nocturia.  Patient reports neuropathy. Feet bothers her the most at night. Patient reports visual changes. Reports seeing an eye doctor recently who told her he would wait to make changes until blood sugar is better controlled. Patient denies self foot exams. Has a wound on her foot and she has seen a "foot doctor", per patient the doctor told her it was healing appropriately.   O:  Physical Exam  Constitutional: She appears well-developed and well-nourished.   Review of Systems  Skin:       Wound on left heel  All other systems reviewed and are negative.    Lab Results  Component Value Date   HGBA1C 13.1 05/13/2017   Vitals:   07/28/17 1410  BP: (!) 143/87  Pulse: 83    Home blood glucose: 200s with some high 100s BG in clinic today: 287  A/P: Diabetes longstanding currently  uncontrolled with A1c 13.1 and blood glucose in clinic 287. Patient denies hypoglycemic events and is able to verbalize appropriate hypoglycemia management plan. Patient reports adherence with medication. Control is suboptimal due to suboptimal medication regimen, sedentary lifestyle and dietary indiscretions. - Continue insulin degludec 110 units daily - Initiate insulin aspart 30 units with dinner - Initiate liraglutide 0.6 mg daily for 1 week, titrate up to 1.2 mg daily for 1 week then 1.8 mg daily thereafter - Next A1C anticipated September 2018  Hypertension longstanding currently uncontrolled with BP 143/87. Patient reports adherence with medications. Control is suboptimal due to sedentary lifestyle and dietary indiscretions. -  Written patient instructions provided.  Total time in face to face counseling 45 minutes.   Follow up with PCP.   Patient seen with Cleotis Lema, PharmD Candidate, and Charlene Brooke, PharmD PGY-1 Resident and Dr. Juleen China.

## 2017-07-29 ENCOUNTER — Telehealth: Payer: Self-pay | Admitting: *Deleted

## 2017-07-29 ENCOUNTER — Telehealth: Payer: Self-pay | Admitting: Cardiovascular Disease

## 2017-07-29 LAB — CBC
HEMATOCRIT: 40.4 % (ref 34.0–46.6)
HEMOGLOBIN: 13.1 g/dL (ref 11.1–15.9)
MCH: 30.1 pg (ref 26.6–33.0)
MCHC: 32.4 g/dL (ref 31.5–35.7)
MCV: 93 fL (ref 79–97)
Platelets: 335 10*3/uL (ref 150–379)
RBC: 4.35 x10E6/uL (ref 3.77–5.28)
RDW: 13.4 % (ref 12.3–15.4)
WBC: 8.9 10*3/uL (ref 3.4–10.8)

## 2017-07-29 LAB — BASIC METABOLIC PANEL
BUN/Creatinine Ratio: 31 — ABNORMAL HIGH (ref 9–23)
BUN: 16 mg/dL (ref 6–20)
CALCIUM: 9.3 mg/dL (ref 8.7–10.2)
CO2: 26 mmol/L (ref 20–29)
Chloride: 98 mmol/L (ref 96–106)
Creatinine, Ser: 0.51 mg/dL — ABNORMAL LOW (ref 0.57–1.00)
GFR calc Af Amer: 140 mL/min/{1.73_m2} (ref >59)
GFR calc non Af Amer: 122 mL/min/{1.73_m2} (ref >59)
Glucose: 236 mg/dL — ABNORMAL HIGH (ref 65–99)
Potassium: 4.3 mmol/L (ref 3.5–5.2)
Sodium: 139 mmol/L (ref 134–144)

## 2017-07-29 LAB — TSH: TSH: 0.906 u[IU]/mL (ref 0.450–4.500)

## 2017-07-29 NOTE — Telephone Encounter (Signed)
Called to speak with pt about her complaints however she states she already has an appt for Monday and has no further needs at this time.

## 2017-07-29 NOTE — Progress Notes (Signed)
Cardiology Office Note    Date:  08/01/2017   ID:  [name], DOB 19-May-1976, MRN [id number]  PCP:  [name], MD  Cardiologist:  Dr. Burt Knack  Chief Complaint: Syncope and abnormal EKG  History of Present Illness:   [name] is a 41 y.o. female with hx of uncontrolled DM, HTN, HLD and morbid obesity presents for dizziness/syncope evaluation.   Patient had recurrent chest pain and underwent cardiac catheterization in 2013 demonstrating normal coronary arteries. Prior hx of syncope of unknown etiology. Echo 05/2016 showed normal LVEF and mild dilated LA.   Patient was seen by PCP fore recent lightheadedness. Felt like going to pass out.  Seen in ER 8/15 --> EKG showed PVCs. Noted elevated blood sugar (503). Given fluid and insulin. Discharged home. Did had one episode of (flet dizzy before) LOC preceded by tunnel visit. Seen by PCP 07/28/17. Felt symptoms is likely vasovagal. Orthostatic vitals were negative. Follow up lab work 8/23 normal - reviewed personally (normal hemoglobin, electrolytes, Scr and TSH : Blood sugar was 236).   Here today for further evaluation. Patient continues to have intermittent dizziness. Recently started on Novolog and victoza (hasn't started yet). Denies chest pain, SOB, palpitation, orthopnea, LE edema, PND or melena. She was on 750 units of insulin at one time. Now reduced. No longer followed by endocrinologist. Her BS used to runs in 450s now in [place]. Yesterday it was 100 at 11pm and felt poor.    Past Medical History:  Diagnosis Date  . Alveolar hypoventilation   . Anemia    not on iron pill  . Arthritis   . Asthma   . Bipolar 2 disorder ([place])   . Carpal tunnel syndrome on right    recurrent  . Cellulitis 08/2010-08/2011  . Chronic pain   . COPD (chronic obstructive pulmonary disease) ([name])    Symbicort daily and Proventil as needed  . Costochondritis   . Depression   . Diabetes mellitus 2000   Type 2, Uncontrolled.Takes Lantus  daily.Fasting blood sugar runs 150  . Dizziness    occasionally  . Drug-seeking behavior   . GERD (gastroesophageal reflux disease)    takes Pantoprazole and Zantac daily  . Headache    migraine-last one about a yr ago.Topamax daily  . HLD (hyperlipidemia)    takes Atorvastatin daily  . Hypertension    takes Lisinopril and Coreg daily  . Morbid obesity ([place])   . Muscle spasm    takes Flexeril as needed  . Nocturia   . Obstructive sleep apnea   . Peripheral neuropathy    takes Gabapentin daily  . Pneumonia    "walking" several yrs ago and as a baby  . Rectal fissure   . Restless leg   . Urinary frequency   . Varicose veins    Right medial thigh and Left leg     Past Surgical History:  Procedure Laterality Date  . CARPAL TUNNEL RELEASE    . CESAREAN SECTION    . KNEE ARTHROSCOPY Right 07/17/2010  . LEFT HEART CATHETERIZATION WITH CORONARY ANGIOGRAM N/A 07/27/2012   Procedure: LEFT HEART CATHETERIZATION WITH CORONARY ANGIOGRAM;  Surgeon: Sherren Mocha, MD;  [facility] C[facility] LAB;  Service: Cardiovascular;  Laterality: N/A;  . left knee surgery     screws she thinks  . MASS EXCISION N/A 06/29/2013   Procedure:  WIDE LOCAL EXCISION OF POSTERIOR NECK ABSCESS;  Surgeon: Ralene Ok, MD;  Location: [facility];  Service: General;  Laterality: N/A;  Current Medications: Prior to Admission medications   Medication Sig Start Date End Date Taking? Authorizing Provider  ACCU-CHEK GUIDE test strip USE T0 CHECK BLOOD SUGAR 3 TIMES DAILY 07/26/17   [name], MD  ACCU-CHEK SOFTCLIX LANCETS lancets Use as instructed to check blood sugar 3 times per day 04/07/17   [name], MD  albuterol (PROVENTIL HFA;VENTOLIN HFA) 108 (90 Base) MCG/ACT inhaler Inhale 1-2 puffs into the lungs every 6 (six) hours as needed for wheezing or shortness of breath. 03/23/17   [name], MD  atorvastatin (LIPITOR) 20 MG tablet Take 1 tablet (20 mg total) by mouth daily. 03/23/17   [name], MD  Blood Glucose Monitoring Suppl (ACCU-CHEK NANO SMARTVIEW) w/Device KIT Use to check blood sugar 3 times daily 04/07/17   [name], MD  budesonide-formoterol [facility]) 160-4.5 MCG/ACT inhaler Inhale 2 puffs into the lungs 2 (two) times daily. 03/23/17   [name], MD  carvedilol (COREG) 3.125 MG tablet Take 1 tablet (3.125 mg total) by mouth 2 (two) times daily with a meal. 03/23/17   [name], MD  Cholecalciferol 1000 units tablet Take 1 tablet (1,000 Units total) by mouth daily. 04/05/17   [name], MD  cyclobenzaprine (FLEXERIL) 5 MG tablet Take 1 tablet (5 mg total) by mouth daily as needed for muscle spasms. 03/23/17   [name], MD  gabapentin (NEURONTIN) 600 MG tablet Take 2 tablets (1,200 mg total) by mouth 3 (three) times daily. 06/02/17   [name], MD  HYDROcodone-acetaminophen (NORCO) 10-325 MG tablet Take 1 tablet by mouth every 8 (eight) hours as needed for moderate pain. 07/04/17   [name], MD  ibuprofen (ADVIL,MOTRIN) 600 MG tablet Take 600 mg by mouth every 8 (eight) hours as needed for pain.    [provider]  insulin aspart (NOVOLOG FLEXPEN) 100 UNIT/ML FlexPen Inject 30 Units into the skin daily with supper. 07/28/17   [name], MD  insulin aspart (NOVOLOG FLEXPEN) 100 UNIT/ML FlexPen Inject 30 Units into the skin daily with supper. 07/28/17   [name], MD  Insulin Degludec (TRESIBA FLEXTOUCH) 200 UNIT/ML SOPN Inject 110 Units into the skin every morning. 07/28/17   [name], MD  Insulin Degludec (TRESIBA FLEXTOUCH) 200 UNIT/ML SOPN Inject 110 Units into the skin daily with breakfast. 07/28/17   [name], MD  Insulin Pen Needle (NOVOFINE) 32G X 6 MM MISC 1 Units by Does not apply route 3 (three) times daily. 07/28/17   [name], MD  Insulin Pen Needle (PEN NEEDLES) 31G X 6 MM MISC Use to inject insulin daily 03/23/17   [name], MD    liraglutide (VICTOZA) 18 MG/3ML SOPN Inject 0.3 mLs (1.8 mg total) into the skin daily. 07/28/17   [name], MD  lisinopril (PRINIVIL,ZESTRIL) 5 MG tablet Take 1 tablet (5 mg total) by mouth daily. 03/23/17   [name], MD  naloxone [facility]) nasal spray 4 mg/0.1 mL 1 spray into the nose as needed for overdose Patient taking differently: Place 1 spray into the nose once as needed (overdose). 1 spray into the nose as needed for overdose 03/23/17   [name], MD  pantoprazole (PROTONIX) 40 MG tablet Take 40 mg by mouth daily.    [provider]    Allergies:   Kiwi extract and Nubain [nalbuphine hcl]   Social History   Social History  . Marital status: Married  Spouse name: N/A  . Number of children: N/A  . Years of education: N/A   Social History Main Topics  . Smoking status: Former Smoker    Packs/day: 0.30    Years: 0.30    Types: Cigarettes    Quit date: 12/06/1993  . Smokeless tobacco: Never Used  . Alcohol use No  . Drug use: Yes     Comment: OD attempts on home meds    . Sexual activity: Yes    Partners: Male   Other Topics Concern  . None   Social History Narrative   Lives in [place] with her fiance and 39 yr old dtr.     Family History:  The patient's family history includes Cancer in her maternal grandmother; Depression in her mother; Diabetes in her maternal grandmother and mother; Heart attack in her father, paternal grandfather, paternal grandmother, and paternal uncle; Heart disease in her paternal grandfather and paternal grandmother; Hyperlipidemia in her maternal grandmother and mother; Hypertension in her maternal grandmother; Other in her maternal grandfather; Varicose Veins in her mother.   ROS:   Please see the history of present illness.    ROS All other systems reviewed and are negative.   PHYSICAL EXAM:   VS:  BP 118/78   Pulse 68   Ht '5\' 2"'  (1.575 m)   Wt 298 lb (135.2 kg)   LMP 07/20/2017   BMI 54.50 kg/m     GEN: Obese female in no acute distress  HEENT: normal  Neck: no JVD, carotid bruits, or masses Cardiac: RRR; no murmurs, rubs, or gallops,no edema  Respiratory:  clear to auscultation bilaterally, normal work of breathing GI: soft, nontender, nondistended, + BS MS: no deformity or atrophy  Skin: warm and dry, no rash Neuro:  Alert and Oriented x 3, Strength and sensation are intact Psych: euthymic mood, full affect  Wt Readings from Last 3 Encounters:  08/01/17 298 lb (135.2 kg)  07/28/17 298 lb 3.2 oz (135.3 kg)  07/22/17 298 lb 9.6 oz (135.4 kg)      Studies/Labs Reviewed:   EKG:  EKG is not ordered today  Recent Labs: 10/31/2016: Magnesium 1.8 12/09/2016: ALT 19 07/28/2017: BUN 16; Creatinine, Ser 0.51; Hemoglobin 13.1; Platelets 335; Potassium 4.3; Sodium 139; TSH 0.906   Lipid Panel    Component Value Date/Time   CHOL 220 (H) 01/11/2017 0909   TRIG 262 (H) 01/11/2017 0909   HDL 35 (L) 01/11/2017 0909   CHOLHDL 6.3 (H) 01/11/2017 0909   VLDL 52 (H) 01/11/2017 0909   LDLCALC 133 (H) 01/11/2017 0909   LDLDIRECT 164.9 11/15/2014 1148    Additional studies/ records that were reviewed today include:   As above   ASSESSMENT & PLAN:    1. Syncope and dizziness - intermitten dizziness x 3 weeks with few episode of syncope (precedded by dizziness). Negative orthostatic and normal labs by PCP except high blood sugar. Her blood sugar used to run in high in 450s now in 250s range. Yesterday it was 100 and felt very poor. Mostly likely her symptoms is due to fluctuating blood sugar. Discussed with Dr. Burt Knack, given PVCs and LOC--> will get 48 hours monitor. Normal LVEF on echo last year.  - Encouraged regular exercise and weight loss. Her main issue is obesity and fluctuating blood sugar. Advised to follow up with endocrinologist and bariatric surgery.   2. Morbid obesity - Estimated body mass index is 54.5 kg/m as calculated from the following:   Height as of this  encounter: '5\' 2"'  (1.575 m).   Weight as of this encounter: 298 lb (135.2 kg).  - As above  3. HTN - STable and well controlled on current regime. She was not orthostatic.     Medication Adjustments/Labs and Tests Ordered: Current medicines are reviewed at length with the patient today.  Concerns regarding medicines are outlined above.  Medication changes, Labs and Tests ordered today are listed in the Patient Instructions below. Patient Instructions  Medication Instructions:  Your physician recommends that you continue on your current medications as directed. Please refer to the Current Medication list given to you today.  Labwork: None   Testing/Procedures: Your physician has recommended that you wear a 48 hour holter monitor. Holter monitors are medical devices that record the heart's electrical activity. Doctors most often use these monitors to diagnose arrhythmias. Arrhythmias are problems with the speed or rhythm of the heartbeat. The monitor is a small, portable device. You can wear one while you do your normal daily activities. This is usually used to diagnose what is causing palpitations/syncope (passing out).   Follow-Up: Your physician recommends that you schedule a follow-up appointment in: 2-4 months with DR COOPER.  Any Other Special Instructions Will Be Listed Below (If Applicable).  If you need a refill on your cardiac medications before your next appointment, please call your pharmacy.     Jarrett Soho, [place]  08/01/2017 9:21 AM    [facility] [address], [place], [place]  [address] Phone: [phone]; Fax: [phone]

## 2017-07-29 NOTE — Telephone Encounter (Addendum)
Prior Authorization received from [employer] for [employer]. Formulary and PA form placed in provider box for completion. Derl Barrow, RN

## 2017-07-29 NOTE — Telephone Encounter (Signed)
Patient calling in regards to syncope, patient states that it has been going on for about a couple weeks. Patient had an EKG and results show heart rate was skipping beats and patient was instructed to call office to f/u

## 2017-08-01 ENCOUNTER — Telehealth: Payer: Self-pay | Admitting: Family Medicine

## 2017-08-01 ENCOUNTER — Encounter: Payer: Self-pay | Admitting: Physician Assistant

## 2017-08-01 ENCOUNTER — Telehealth: Payer: Self-pay | Admitting: Pharmacist

## 2017-08-01 ENCOUNTER — Ambulatory Visit (INDEPENDENT_AMBULATORY_CARE_PROVIDER_SITE_OTHER): Payer: Medicaid Other | Admitting: Physician Assistant

## 2017-08-01 VITALS — BP 118/78 | HR 68 | Ht 62.0 in | Wt 298.0 lb

## 2017-08-01 DIAGNOSIS — I10 Essential (primary) hypertension: Secondary | ICD-10-CM

## 2017-08-01 DIAGNOSIS — R55 Syncope and collapse: Secondary | ICD-10-CM

## 2017-08-01 NOTE — Telephone Encounter (Signed)
Reviewed PA forms. It appears Bydureon or Byetta are preferred over Victoza for [place] [employer].   Dr. Valentina Lucks, do you have thoughts on whether we could instead do one of those, or is there a compelling reason to stick with Victoza?  Thanks for all you are doing for [place]! [name], MD

## 2017-08-01 NOTE — Telephone Encounter (Signed)
Pharmacy called checking the status of PA for Victoza. Please advise.  Derl Barrow, RN

## 2017-08-01 NOTE — Telephone Encounter (Signed)
Phone call returned.  Patient reported. one blood sugar below 90 and another blood sugar ~ 70.  Symptoms were mild and tolerable.  States symptoms of fatigue and weakness.   Denies nocturnal hypoglycemia.  Blood sugars are 130, 160, 180 but also has more 200 readings than < 200.   Has NOT yet started Liraglutide.   Following discussion, agreed with would continue same doses of Tresiba 110 units daily and Novolog 30 untis at this time. She will also attempt to eat something while awake approximately every 4 hours.     Plan to call and talk to her on Friday AM AND attempt to reevaluate blood sugar at this time.   She was encouraged to call our office at any time this week should she have concerns about her blood sugar.   I educated her that it may take several days to have her body readjust to more normal blood sugars.  I also anticipate improved control with Liraglutide.

## 2017-08-01 NOTE — Patient Instructions (Addendum)
Medication Instructions:  Your physician recommends that you continue on your current medications as directed. Please refer to the Current Medication list given to you today.  Labwork: None   Testing/Procedures: Your physician has recommended that you wear a 48 hour holter monitor. Holter monitors are medical devices that record the heart's electrical activity. Doctors most often use these monitors to diagnose arrhythmias. Arrhythmias are problems with the speed or rhythm of the heartbeat. The monitor is a small, portable device. You can wear one while you do your normal daily activities. This is usually used to diagnose what is causing palpitations/syncope (passing out).   Follow-Up: Your physician recommends that you schedule a follow-up appointment in: 2-4 months with DR COOPER.  Any Other Special Instructions Will Be Listed Below (If Applicable).  If you need a refill on your cardiac medications before your next appointment, please call your pharmacy.

## 2017-08-01 NOTE — Telephone Encounter (Signed)
Pt would like to talk to dr Valentina Lucks about her sugar levels.  They have been dropping below 100 over the weekend.

## 2017-08-03 ENCOUNTER — Telehealth: Payer: Self-pay

## 2017-08-03 NOTE — Telephone Encounter (Signed)
Pt informed. Pt has an appt with Dr. [name] on 9/14. Ottis Stain, CMA

## 2017-08-03 NOTE — Telephone Encounter (Signed)
-----   Message from Nicolette Bang, DO sent at 08/03/2017  2:50 PM EDT ----- Please call patient to let her know blood sugar was 236. She had normal kidney function and electrolytes. Thyroid function was normal. Blood counts were also normal. She should follow up with PCP and with Dr. Valentina Lucks.   Phill Myron, D.O. 08/03/2017, 2:49 PM PGY-3, [place]

## 2017-08-03 NOTE — Telephone Encounter (Signed)
PA completed, will return to [facility] nurse desk [name], MD

## 2017-08-03 NOTE — Telephone Encounter (Signed)
PA for Victoza is pending per [employer]. PA for Bydureon voided.  Derl Barrow, RN

## 2017-08-04 NOTE — Telephone Encounter (Signed)
PA for Victoza was approved via [place] until 07/29/18.  Approval number [id number].  Derl Barrow, RN

## 2017-08-05 ENCOUNTER — Telehealth: Payer: Self-pay | Admitting: Pharmacist

## 2017-08-05 DIAGNOSIS — E1142 Type 2 diabetes mellitus with diabetic polyneuropathy: Secondary | ICD-10-CM

## 2017-08-05 IMAGING — DX DG CHEST 2V
2 series · 2 of 2 positions shown · non-contrast
Comparison: 02/28/2016

CLINICAL DATA: Chest pain

EXAM:
CHEST  2 VIEW

[chest pa]
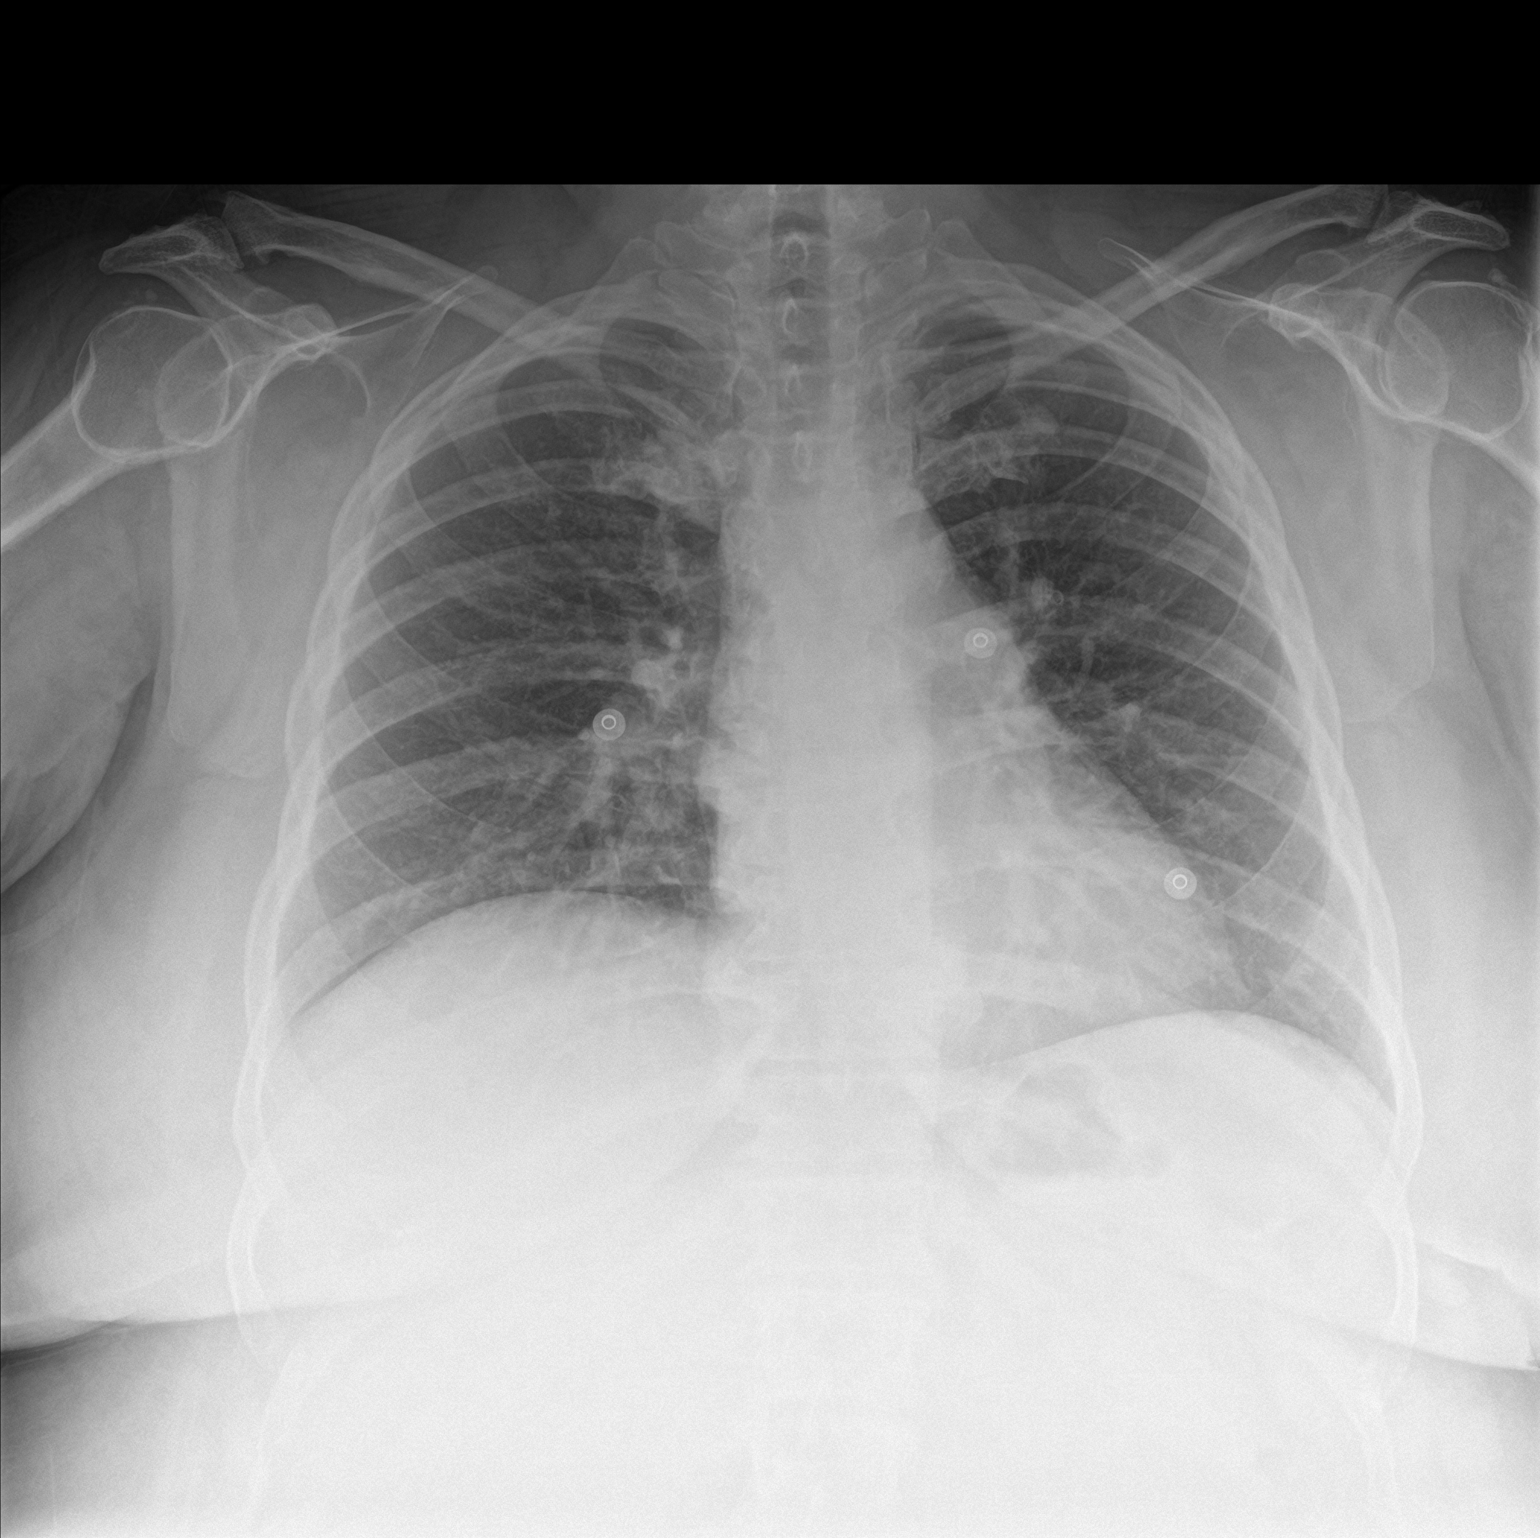

[chest lat]
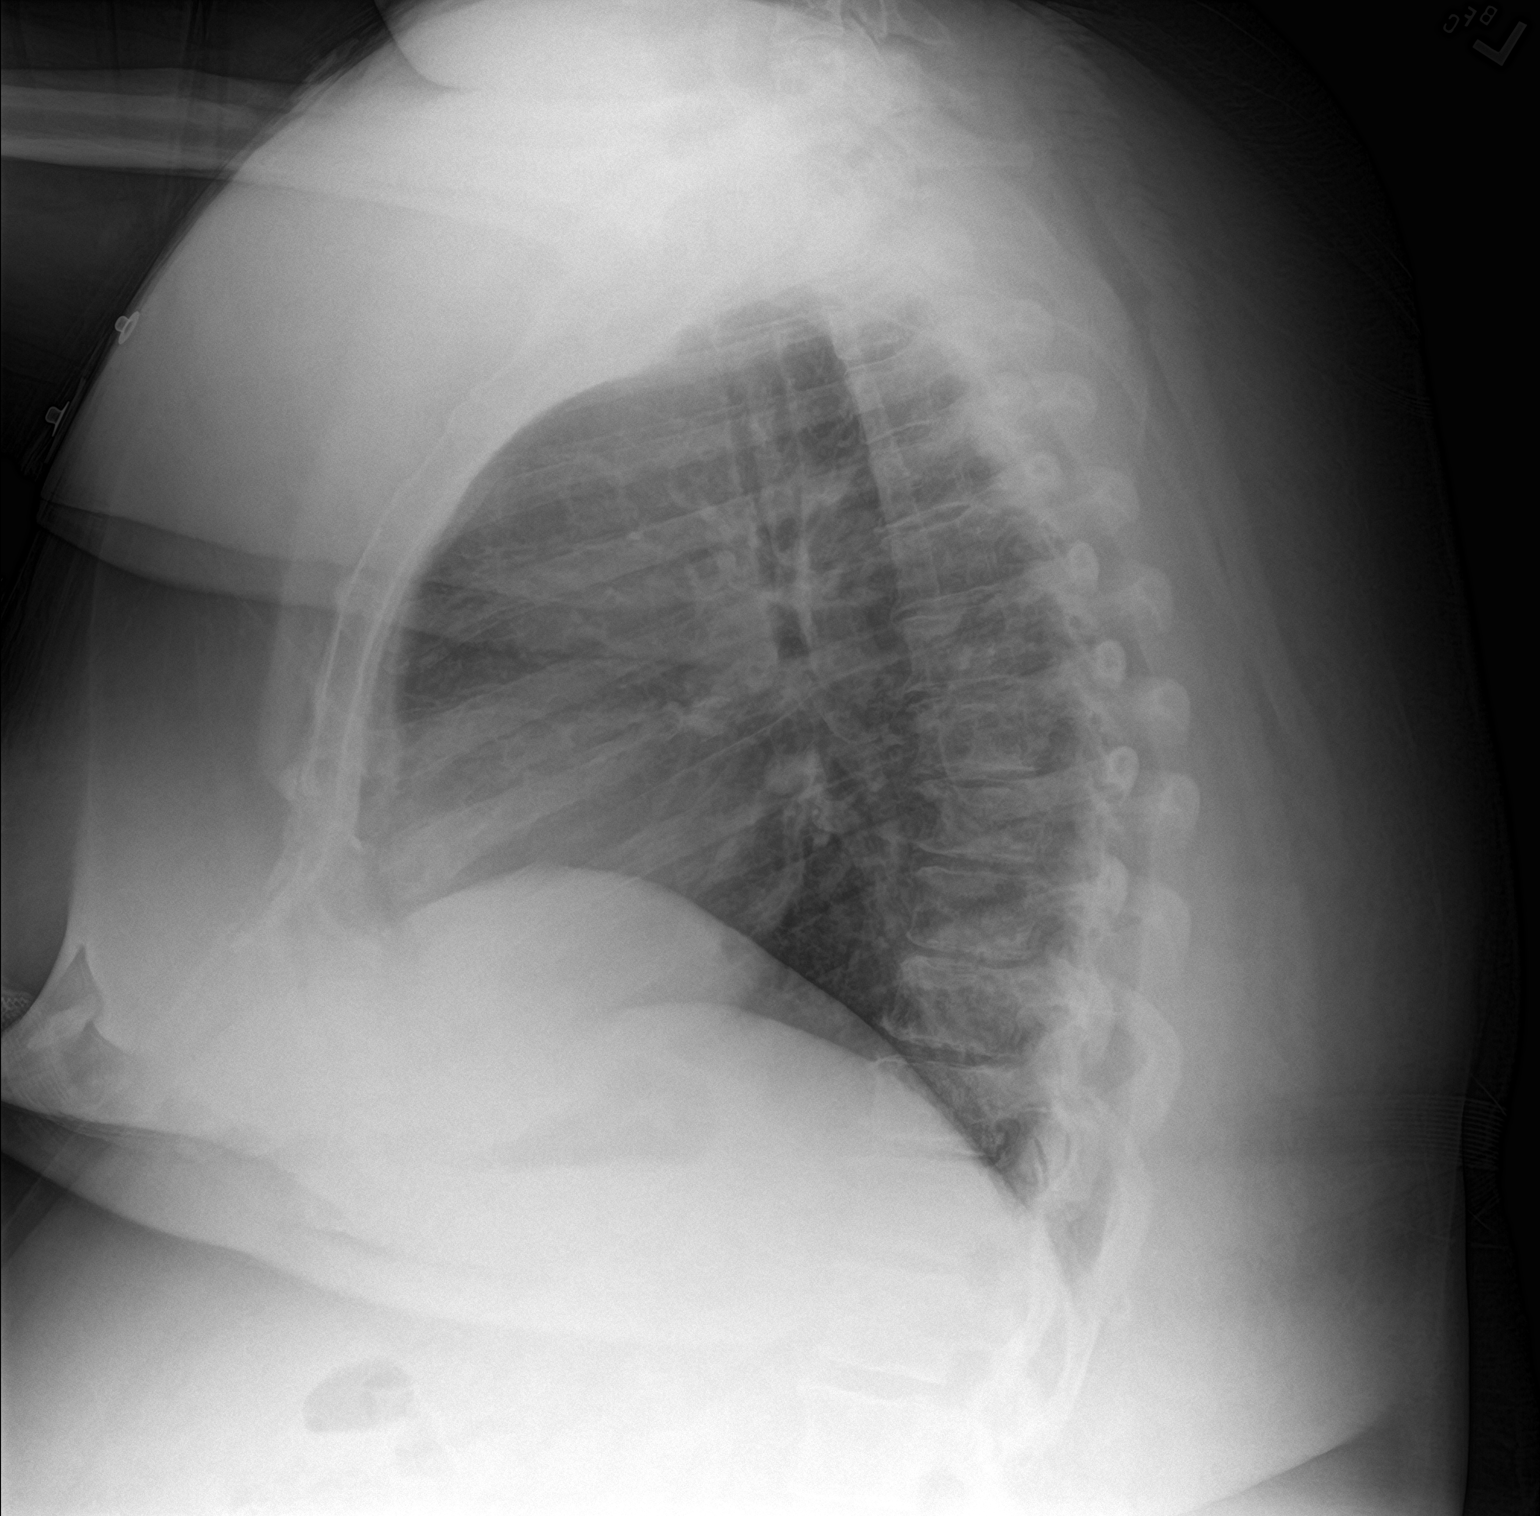

[2 of 2 positions shown; findings below may reference images not displayed]

FINDINGS: Normal heart size and mediastinal contours. No acute infiltrate or
edema. No effusion or pneumothorax. Increased density over the lower
thoracic spine laterally is attributed to spondylotic spurring. No
acute osseous findings.
IMPRESSION: Stable.  No evidence of acute disease.

## 2017-08-05 MED ORDER — INSULIN DEGLUDEC 200 UNIT/ML ~~LOC~~ SOPN: 100.0000 [IU] | PEN_INJECTOR | Freq: Every day | SUBCUTANEOUS | Status: DC

## 2017-08-05 MED ORDER — INSULIN ASPART 100 UNIT/ML FLEXPEN: 25.0000 [IU] | PEN_INJECTOR | Freq: Every day | SUBCUTANEOUS | Status: DC

## 2017-08-05 NOTE — Telephone Encounter (Signed)
Follow up phone call for diabetes, blood sugar control. Patient reports she was aware that her Victoza was approved by Medicaid.  She plans to pick this medication up later today AND start today or tomorrow.  She was aware of the dosing 0.6mg  once daily.   Her home blood sugar readings were reported as 100 and 200s with some variability throughout the day.   She has noticed readings for 120-130 in the evening after her dinner.  She denies any readings at any time of day < 100.  Her blood sugar readings are much improved and she was satisfied with progress.   As we are staring Victoza, I instructed patient to reduce Tresiba from 110 units daily to 100 units daily AND reduce her prandial Novolog from 30 units to 25 units prior to dinner.    Of note, patient continues to complain of passing out episodes (mostly in the evening).  She states these started before our beginning more aggressive blood sugar control management recently.   She also shared that she is scheduled to have a monitor placed by cardiology on 9/6.  Her daughter has checked the patient's blood sugar when she passes out and the CBG readings are reported to be in the 200s. I encouraged her to keep the appointment for the monitor and keep her appointment with Dr. [name].

## 2017-08-10 ENCOUNTER — Other Ambulatory Visit: Payer: Self-pay | Admitting: *Deleted

## 2017-08-10 ENCOUNTER — Telehealth: Payer: Self-pay | Admitting: Pharmacist

## 2017-08-10 DIAGNOSIS — G8929 Other chronic pain: Secondary | ICD-10-CM

## 2017-08-10 NOTE — Telephone Encounter (Signed)
Follow-Up of initiation of Victoza (liraglutide) at a dose of 0.6mg  daily.   No side effects reported.  Denies hypoglycemia.  Reports stlll passing out in the PM and evening.  Cardiac Monitor is scheduled to be placed tomorrow.   Reported home CBGs high 100s at times,  Mostly low 200s.  Denies readings> 250.   Denies any readings < 100.   Plan to increase Victoza to 1.2mg  daily on Friday.

## 2017-08-10 NOTE — Telephone Encounter (Signed)
PA fax received for this medication.  Will place information in provider's box once form is received. Edilia Ghuman,CMA

## 2017-08-10 NOTE — Telephone Encounter (Signed)
Pt is calling about the status of her pain medication. It had to be PA and she is in desperate pain waiting for this to be approved. Do we know a status of how much longer. jw

## 2017-08-11 ENCOUNTER — Ambulatory Visit: Payer: Self-pay | Admitting: Endocrinology

## 2017-08-11 ENCOUNTER — Ambulatory Visit (INDEPENDENT_AMBULATORY_CARE_PROVIDER_SITE_OTHER): Payer: Medicaid Other

## 2017-08-11 DIAGNOSIS — R55 Syncope and collapse: Secondary | ICD-10-CM | POA: Diagnosis not present

## 2017-08-11 NOTE — Telephone Encounter (Signed)
Top portion of PA fax form completed and placed in provider's box. Hubbard Hartshorn, RN, BSN

## 2017-08-12 NOTE — Telephone Encounter (Signed)
Of note patient does not actually need a refill, just needed PA completed. Refills need to be done in office visits. She has a script already to be filled in September. [name], MD

## 2017-08-12 NOTE — Telephone Encounter (Signed)
PA completed and returned to [facility] [name], MD

## 2017-08-12 NOTE — Telephone Encounter (Signed)
PA faxed to [place] at [phone]. Hubbard Hartshorn, RN, BSN

## 2017-08-15 ENCOUNTER — Telehealth: Payer: Self-pay | Admitting: *Deleted

## 2017-08-15 ENCOUNTER — Encounter: Payer: Self-pay | Admitting: Family Medicine

## 2017-08-15 NOTE — Telephone Encounter (Signed)
Prior Authorization received from [place] for Hydrocodone-Acetaminophen 10-325 mg. PA was denied via [facility] [employer].  There was no documentation submitted with PA with continuation of short-acting opioid. Denial placed in provider box for review.    Derl Barrow, RN

## 2017-08-16 NOTE — Telephone Encounter (Signed)
I printed my last office note and will return it to [place] to submit with prior auth. [name], MD

## 2017-08-16 NOTE — Telephone Encounter (Signed)
PA form faxed to [facility] for review. Derl Barrow, RN

## 2017-08-17 NOTE — Telephone Encounter (Signed)
PA is still pending per [place] [employer].  Derl Barrow, RN

## 2017-08-18 ENCOUNTER — Ambulatory Visit (INDEPENDENT_AMBULATORY_CARE_PROVIDER_SITE_OTHER): Payer: Medicaid Other | Admitting: Family Medicine

## 2017-08-18 VITALS — BP 132/72 | HR 107 | Temp 98.1°F | Ht 62.0 in | Wt 301.8 lb

## 2017-08-18 DIAGNOSIS — R55 Syncope and collapse: Secondary | ICD-10-CM | POA: Diagnosis not present

## 2017-08-18 DIAGNOSIS — E119 Type 2 diabetes mellitus without complications: Secondary | ICD-10-CM | POA: Diagnosis not present

## 2017-08-18 DIAGNOSIS — Z23 Encounter for immunization: Secondary | ICD-10-CM

## 2017-08-18 DIAGNOSIS — Z794 Long term (current) use of insulin: Secondary | ICD-10-CM

## 2017-08-18 DIAGNOSIS — R0989 Other specified symptoms and signs involving the circulatory and respiratory systems: Secondary | ICD-10-CM | POA: Diagnosis not present

## 2017-08-18 DIAGNOSIS — G8929 Other chronic pain: Secondary | ICD-10-CM | POA: Diagnosis not present

## 2017-08-18 DIAGNOSIS — IMO0001 Reserved for inherently not codable concepts without codable children: Secondary | ICD-10-CM

## 2017-08-18 LAB — GLUCOSE, POCT (MANUAL RESULT ENTRY): POC GLUCOSE: 520 mg/dL — AB (ref 70–99)

## 2017-08-18 LAB — POCT GLYCOSYLATED HEMOGLOBIN (HGB A1C): HEMOGLOBIN A1C: 11.6

## 2017-08-18 MED ORDER — HYDROCODONE-ACETAMINOPHEN 10-325 MG PO TABS: 1.0000 | ORAL_TABLET | Freq: Three times a day (TID) | ORAL | 0 refills | Status: DC | PRN

## 2017-08-18 NOTE — Assessment & Plan Note (Addendum)
Never got ABIs. Will reschedule these. Foot wound does not currently appeared infected though she is at risk for poor healing & infection with her uncontrolled diabetes. Advised to schedule with podiatry ASAP
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
## 2017-08-18 NOTE — Telephone Encounter (Signed)
Check in [place] [employer] for PA for hydrocodone; Pa was approved. Called patient to advised that PA was approved she stated she was able to pickup her medication on Monday.  Derl Barrow, RN

## 2017-08-18 NOTE — Assessment & Plan Note (Signed)
>>  ASSESSMENT AND PLAN FOR INSULIN DEPENDENT TYPE 2 DIABETES MELLITUS ([place]) WRITTEN ON 08/18/2017  9:43 PM BY Amila Callies, [name], MD  CBG high here in clinic, likely as she hasn't yet taken her insulin this AM. Otherwise well. Counseled to go home and take insulin immediately after visit today. To continue following with Dr. Valentina Lucks regarding diabetes. Update A1c today.

## 2017-08-18 NOTE — Assessment & Plan Note (Signed)
Unclear if this could be psychogenic potentially. Over the years [name] has had a number of neurological symptoms that she gets a big workup for and then they subsequently resolve, suggesting possible psychosomatic cause, though still warrants full evaluation -Update echo -Continue to follow up with cardiology re: syncope workup

## 2017-08-18 NOTE — Assessment & Plan Note (Signed)
Pain stable. [place] Controlled Substance Database reviewed, findings are appropriate. rx provided for 3 months worth of medications.

## 2017-08-18 NOTE — Patient Instructions (Addendum)
Go home and take insulin ASAP Check sugars later today as usual Call us if they don't get better with your insulin  Keep follow up visit with cardiology  Scheduling heart ultrasound & checking bloodflow to feet  Call podiatrist for follow up appointment for your heel ulcer  Checking A1c today  Refilled pain medications for 3 months  Be well, Dr. [name]

## 2017-08-18 NOTE — Assessment & Plan Note (Signed)
CBG high here in clinic, likely as she hasn't yet taken her insulin this AM. Otherwise well. Counseled to go home and take insulin immediately after visit today. To continue following with Dr. Valentina Lucks regarding diabetes. Update A1c today.

## 2017-08-19 ENCOUNTER — Ambulatory Visit: Payer: Self-pay | Admitting: Family Medicine

## 2017-08-22 ENCOUNTER — Other Ambulatory Visit: Payer: Self-pay | Admitting: Family Medicine

## 2017-08-22 ENCOUNTER — Telehealth: Payer: Self-pay | Admitting: Pharmacist

## 2017-08-22 NOTE — Telephone Encounter (Signed)
Attempted call to follow up CBGs given A1c result of 11.6 and high reading in office last week.   If she returns call I would like to assess current Liraglutide dose.   If not at 1.8mg  per day I will suggest further titration.

## 2017-08-23 ENCOUNTER — Telehealth: Payer: Self-pay | Admitting: Cardiovascular Disease

## 2017-08-23 IMAGING — CR DG FOOT COMPLETE 3+V*R*
3 series · 3 of 3 positions shown · non-contrast
Comparison: 04/13/2014

CLINICAL DATA: Blister on right foot on medial surface near
calcaneus x2 days. Denies injury. Hx of diabetes.

EXAM:
RIGHT FOOT COMPLETE - 3+ VIEW

[x foot ap right]
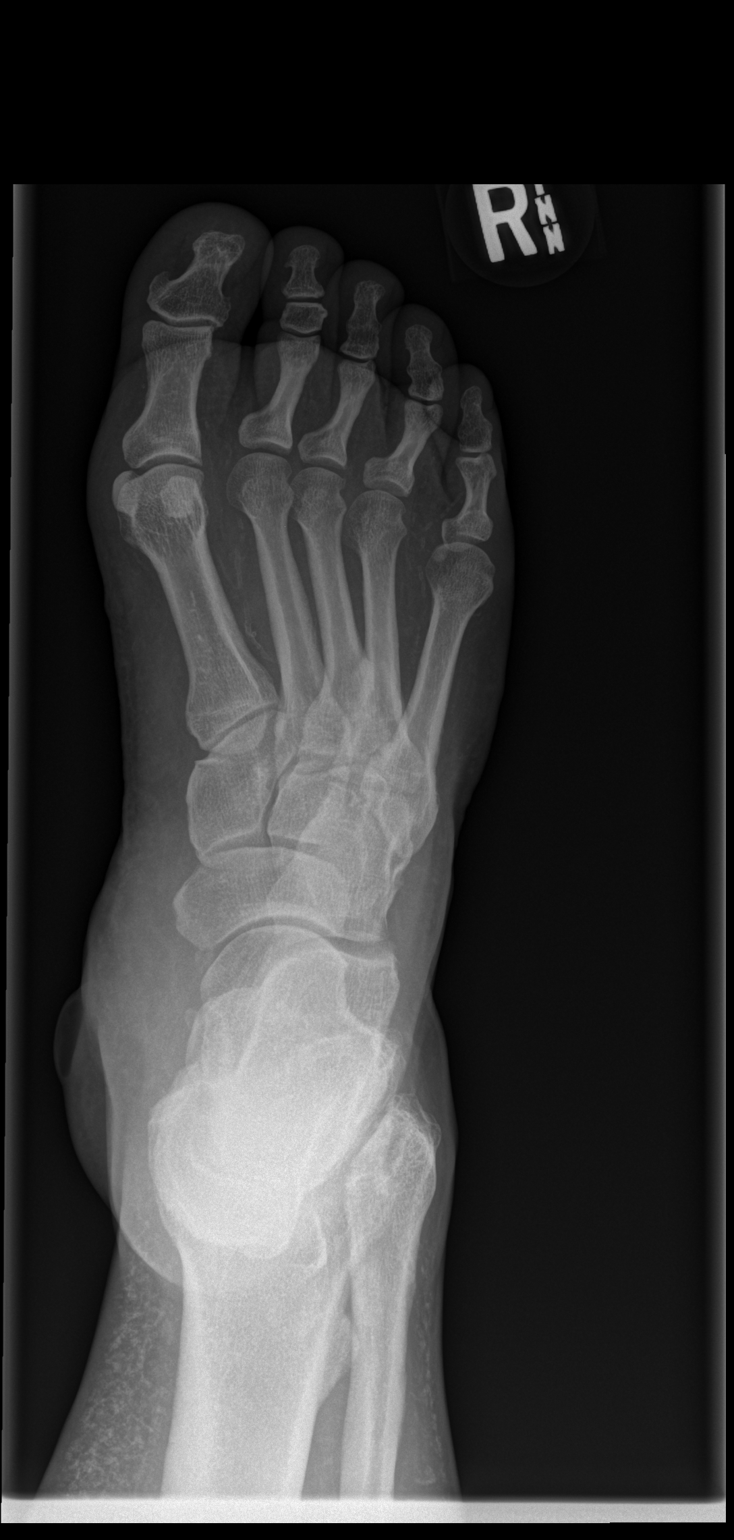

[x foot obl right]
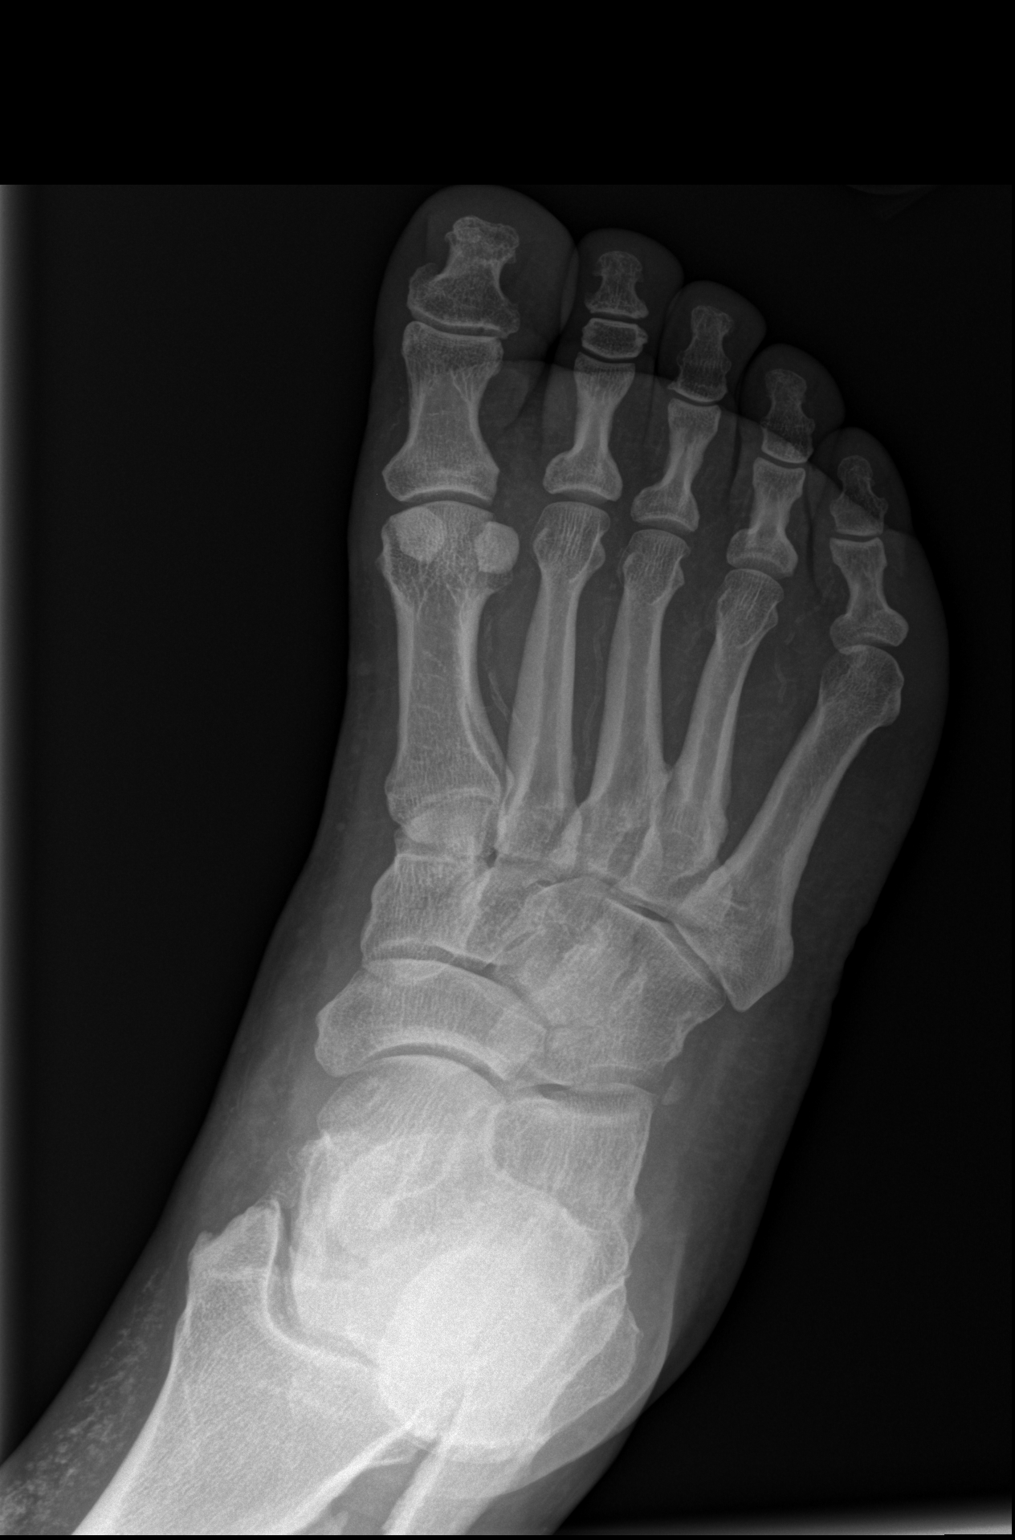

[x foot lat right]
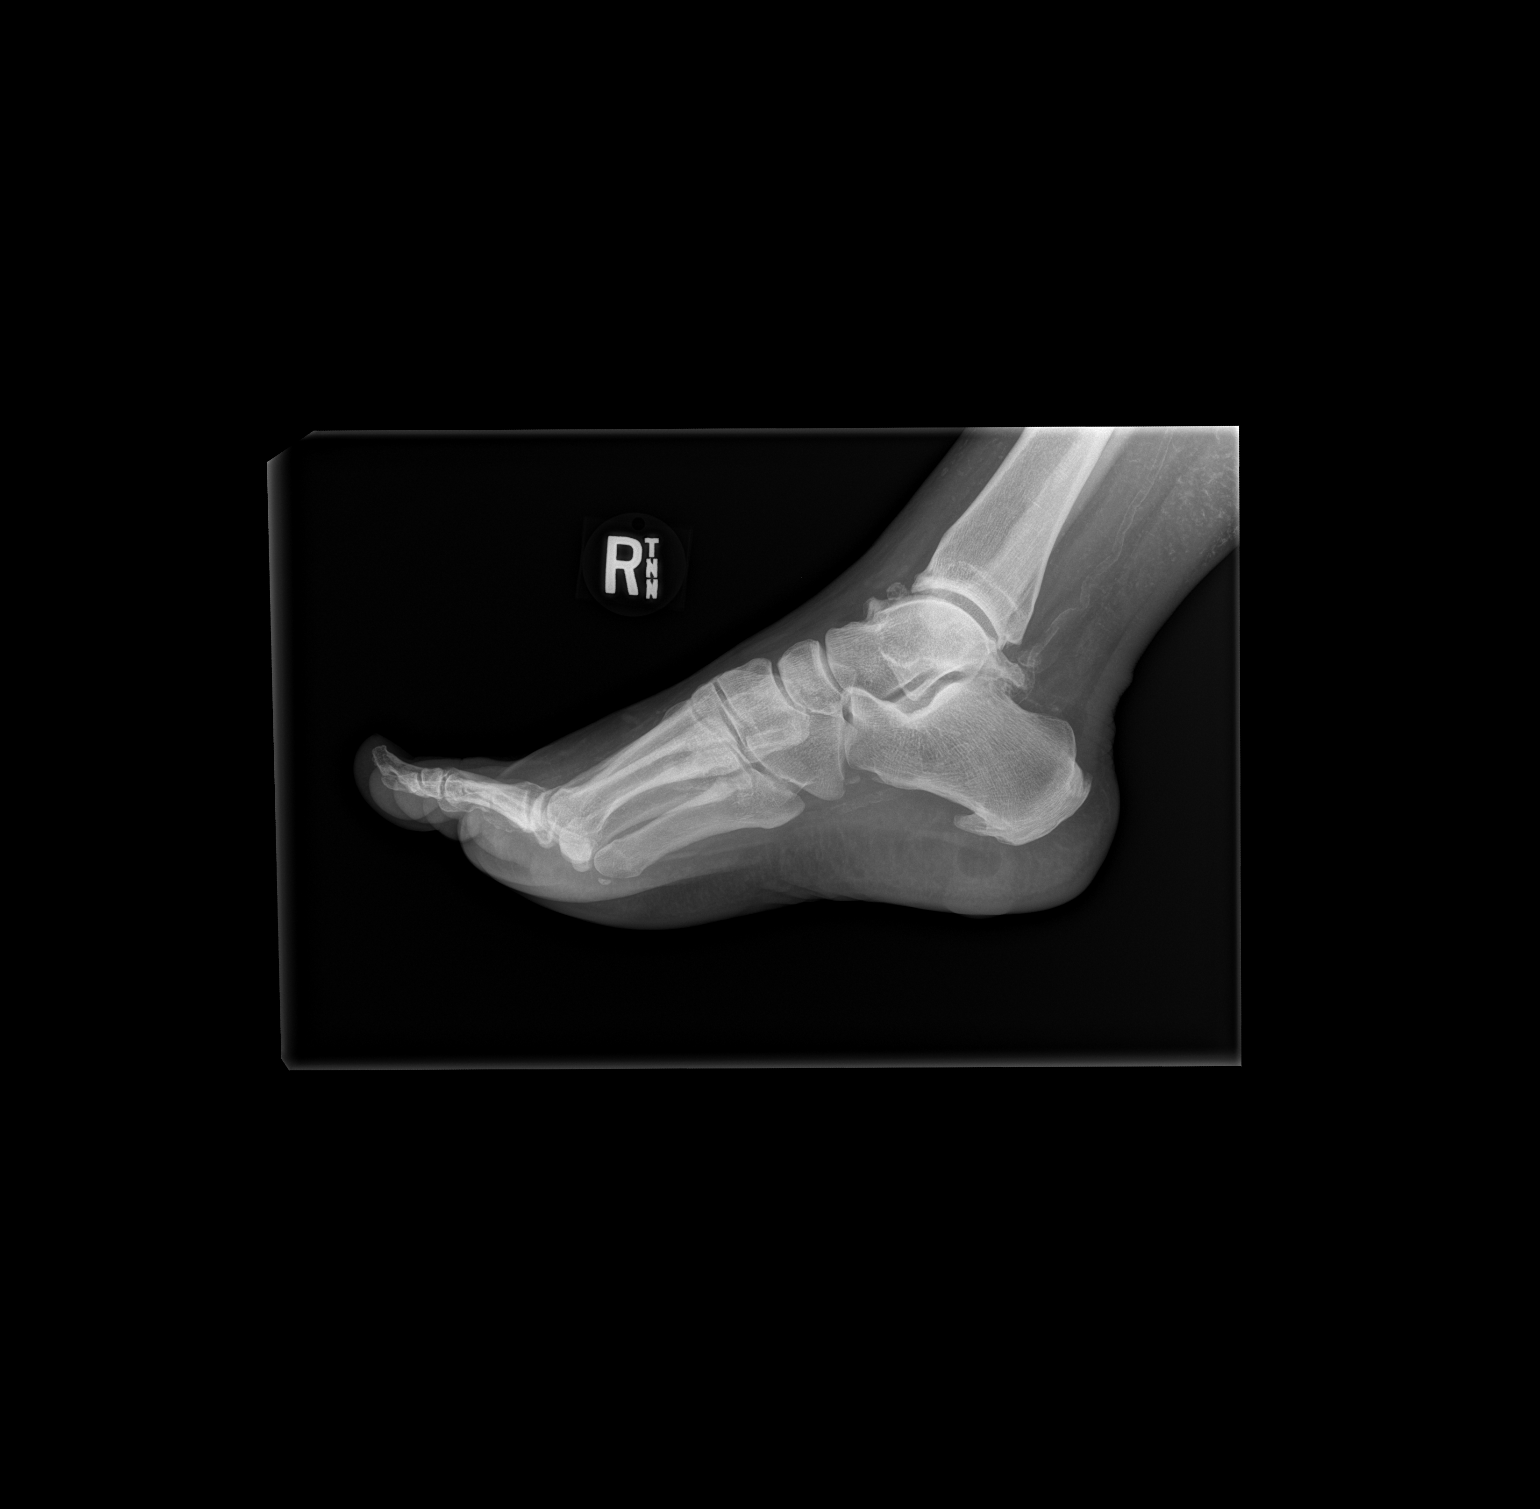

[3 of 3 positions shown; findings below may reference images not displayed]

FINDINGS: There is diffuse soft tissue swelling. Extensive arterial
calcifications are noted. Soft tissue calcifications are noted in
the lower leg, raising the question of venous insufficiency.

There is no acute fracture or traumatic subluxation. Calcaneal spurs
are present. There is post traumatic change at the tibiotalar joint,
stable in appearance.

Along the medial aspect of the hindfoot, there is a soft tissue
vesicle, correlating with the history. No associated soft tissue gas
or foreign body identified.
IMPRESSION: 1. Soft tissue vesicle and soft tissue swelling along the medial
aspect of the hindfoot.
2. No acute fracture, foreign body, or soft tissue gas.
3. Chronic skin calcifications raising the question of venous
insufficiency.
4. Arterial calcifications.

## 2017-08-23 NOTE — Telephone Encounter (Signed)
Informed patient of results and verbal understanding expressed.  

## 2017-08-23 NOTE — Telephone Encounter (Signed)
-----   Message from Sherren Mocha, MD sent at 08/18/2017  2:48 PM EDT ----- Benign holter except for frequent PVC's. Would continue beta-blocker (pt on coreg)

## 2017-08-23 NOTE — Telephone Encounter (Signed)
F/u Message  Pt call stating she was returning RN call. Please call back to discuss

## 2017-08-24 NOTE — Telephone Encounter (Signed)
Entered in error

## 2017-08-25 ENCOUNTER — Ambulatory Visit (HOSPITAL_COMMUNITY): Admission: RE | Admit: 2017-08-25 | Payer: Medicaid Other | Source: Ambulatory Visit

## 2017-08-31 ENCOUNTER — Telehealth: Payer: Self-pay

## 2017-08-31 NOTE — Telephone Encounter (Signed)
ECHO is already scheduled 9/27.  Will await results and send to Dr. Burt Knack for review.

## 2017-08-31 NOTE — Telephone Encounter (Signed)
-----   Message from Sherren Mocha, MD sent at 08/30/2017  9:18 PM EDT ----- [name] - she should have an echo because of frequent PVC's and chest pain. There may already be an order in place.  I don't think she needs any other cardiac workup at this time. It's not really practical to do any stress testing because of morbid obesity and she had normal coronaries by cath in 2013.  thx Ronalee Belts

## 2017-09-01 ENCOUNTER — Ambulatory Visit (HOSPITAL_BASED_OUTPATIENT_CLINIC_OR_DEPARTMENT_OTHER)
Admission: RE | Admit: 2017-09-01 | Discharge: 2017-09-01 | Disposition: A | Discharge status: Home / Self Care | Payer: Medicaid Other | Source: Ambulatory Visit | Attending: Family Medicine | Admitting: Family Medicine

## 2017-09-01 ENCOUNTER — Ambulatory Visit: Payer: Self-pay | Admitting: Podiatry

## 2017-09-01 ENCOUNTER — Ambulatory Visit (HOSPITAL_COMMUNITY)
Admission: RE | Admit: 2017-09-01 | Discharge: 2017-09-01 | Disposition: A | Discharge status: Home / Self Care | Payer: Medicaid Other | Source: Ambulatory Visit | Attending: Family Medicine | Admitting: Family Medicine

## 2017-09-01 ENCOUNTER — Other Ambulatory Visit: Payer: Self-pay | Admitting: *Deleted

## 2017-09-01 DIAGNOSIS — R55 Syncope and collapse: Secondary | ICD-10-CM | POA: Diagnosis not present

## 2017-09-01 DIAGNOSIS — I420 Dilated cardiomyopathy: Secondary | ICD-10-CM | POA: Insufficient documentation

## 2017-09-01 DIAGNOSIS — R0989 Other specified symptoms and signs involving the circulatory and respiratory systems: Secondary | ICD-10-CM

## 2017-09-01 MED ORDER — PERFLUTREN LIPID MICROSPHERE
1.0000 mL | INTRAVENOUS | Status: AC | PRN
  Administered 2017-09-01: 2 mL via INTRAVENOUS
  Filled 2017-09-01: qty 10

## 2017-09-01 MED ORDER — PANTOPRAZOLE SODIUM 40 MG PO TBEC: 40.0000 mg | DELAYED_RELEASE_TABLET | Freq: Every day | ORAL | 1 refills | Status: DC

## 2017-09-01 NOTE — Progress Notes (Signed)
  Echocardiogram 2D Echocardiogram has been performed.  Merrie Roof F 09/01/2017, 3:06 PM

## 2017-09-01 NOTE — Progress Notes (Signed)
VASCULAR LAB PRELIMINARY  ARTERIAL  ABI completed: Right ABI within normal limits, but may be inaccurate due to calcified vessels. Left ABI non compressible, most likely due to calcified vessels. Bilateral arterial waveforms within normal limits, indicating adequate flow.    RIGHT    LEFT    PRESSURE WAVEFORM  PRESSURE WAVEFORM  BRACHIAL 171 Tri BRACHIAL 132 Tri  DP   DP    AT 188 Tri AT 206 Tri  PT 186 Bi PT 255 Bi  PER   PER    GREAT TOE  NA GREAT TOE  NA    RIGHT LEFT  ABI 1.1 Non compressible     Landry Mellow, RDMS, RVT  09/01/2017, 3:06 PM

## 2017-09-03 ENCOUNTER — Emergency Department (HOSPITAL_COMMUNITY): Payer: Medicaid Other

## 2017-09-03 ENCOUNTER — Emergency Department (HOSPITAL_COMMUNITY)
Admission: EM | Admit: 2017-09-03 | Discharge: 2017-09-03 | Discharge status: Against Medical Advice | Payer: Medicaid Other | Attending: Emergency Medicine | Admitting: Emergency Medicine

## 2017-09-03 DIAGNOSIS — R938 Abnormal findings on diagnostic imaging of other specified body structures: Secondary | ICD-10-CM | POA: Diagnosis not present

## 2017-09-03 DIAGNOSIS — E1140 Type 2 diabetes mellitus with diabetic neuropathy, unspecified: Secondary | ICD-10-CM | POA: Diagnosis not present

## 2017-09-03 DIAGNOSIS — J45909 Unspecified asthma, uncomplicated: Secondary | ICD-10-CM | POA: Diagnosis not present

## 2017-09-03 DIAGNOSIS — J449 Chronic obstructive pulmonary disease, unspecified: Secondary | ICD-10-CM | POA: Insufficient documentation

## 2017-09-03 DIAGNOSIS — Z794 Long term (current) use of insulin: Secondary | ICD-10-CM | POA: Diagnosis not present

## 2017-09-03 DIAGNOSIS — Z79899 Other long term (current) drug therapy: Secondary | ICD-10-CM | POA: Insufficient documentation

## 2017-09-03 DIAGNOSIS — Z87891 Personal history of nicotine dependence: Secondary | ICD-10-CM | POA: Insufficient documentation

## 2017-09-03 DIAGNOSIS — R0789 Other chest pain: Secondary | ICD-10-CM | POA: Insufficient documentation

## 2017-09-03 DIAGNOSIS — R079 Chest pain, unspecified: Secondary | ICD-10-CM

## 2017-09-03 DIAGNOSIS — I10 Essential (primary) hypertension: Secondary | ICD-10-CM | POA: Insufficient documentation

## 2017-09-03 LAB — CBC
HEMATOCRIT: 39.1 % (ref 36.0–46.0)
HEMOGLOBIN: 13.4 g/dL (ref 12.0–15.0)
MCH: 29.8 pg (ref 26.0–34.0)
MCHC: 34.3 g/dL (ref 30.0–36.0)
MCV: 86.9 fL (ref 78.0–100.0)
Platelets: 362 10*3/uL (ref 150–400)
RBC: 4.5 MIL/uL (ref 3.87–5.11)
RDW: 12.2 % (ref 11.5–15.5)
WBC: 13.1 10*3/uL — ABNORMAL HIGH (ref 4.0–10.5)

## 2017-09-03 LAB — I-STAT TROPONIN, ED: TROPONIN I, POC: 0 ng/mL (ref 0.00–0.08)

## 2017-09-03 LAB — BASIC METABOLIC PANEL
ANION GAP: 11 (ref 5–15)
BUN: 10 mg/dL (ref 6–20)
CHLORIDE: 98 mmol/L — AB (ref 101–111)
CO2: 24 mmol/L (ref 22–32)
Calcium: 8.8 mg/dL — ABNORMAL LOW (ref 8.9–10.3)
Creatinine, Ser: 0.57 mg/dL (ref 0.44–1.00)
Glucose, Bld: 303 mg/dL — ABNORMAL HIGH (ref 65–99)
POTASSIUM: 3.4 mmol/L — AB (ref 3.5–5.1)
SODIUM: 133 mmol/L — AB (ref 135–145)

## 2017-09-03 LAB — BRAIN NATRIURETIC PEPTIDE: B NATRIURETIC PEPTIDE 5: 20.9 pg/mL (ref 0.0–100.0)

## 2017-09-03 LAB — D-DIMER, QUANTITATIVE: D-Dimer, Quant: 0.47 ug/mL-FEU (ref 0.00–0.50)

## 2017-09-03 MED ORDER — GI COCKTAIL ~~LOC~~: 30.0000 mL | Freq: Once | ORAL | Status: DC

## 2017-09-03 NOTE — ED Triage Notes (Signed)
Per pt she was at home tonight and started having chest pain and pressure. Pt stated she was not having any nausea or vomiting. Pt took one nitro. EMS gave 324 aspirin. 140/86,108 p, cbg 328, 97 ra.

## 2017-09-03 NOTE — ED Notes (Signed)
Dr. Wyvonnia Dusky explained tests results and plan of care to pt. , but pt. Refused to stay.

## 2017-09-03 NOTE — ED Provider Notes (Signed)
[facility] DEPT Provider Note   CSN: [id number] Arrival date & time: 09/03/17  2229     History   Chief Complaint Chief Complaint  Patient presents with  . Chest Pain    HPI [name] is a 41 y.o. female.  Patient arrives via EMS with episode of central chest pain that started while she was resting.pain was in the left side of her chest and did not radiate to her arm, neck or back. There is no nausea or vomiting. There is no shortness of breath. EMS was called and gave her one nitroglycerin. Patient is chest pain-free at this time. Pain lasted about one hour. Patient states she has had this pain once before and was told it was not her heart. She denies ever having a stress test or catheterization. Patient is morbidly obese with a history of hypertension and diabetes.atient also has a history of acid reflux and GERD.states she feels back to baseline now and wishes to leave.   The history is provided by the patient and the EMS personnel.  Chest Pain   Associated symptoms include nausea. Pertinent negatives include no abdominal pain, no dizziness, no headaches, no shortness of breath, no vomiting and no weakness.    Past Medical History:  Diagnosis Date  . Alveolar hypoventilation   . Anemia    not on iron pill  . Arthritis   . Asthma   . Bipolar 2 disorder ([place])   . Carpal tunnel syndrome on right    recurrent  . Cellulitis 08/2010-08/2011  . Chronic pain   . COPD (chronic obstructive pulmonary disease) ([name])    Symbicort daily and Proventil as needed  . Costochondritis   . Depression   . Diabetes mellitus 2000   Type 2, Uncontrolled.Takes Lantus daily.Fasting blood sugar runs 150  . Dizziness    occasionally  . Drug-seeking behavior   . GERD (gastroesophageal reflux disease)    takes Pantoprazole and Zantac daily  . Headache    migraine-last one about a yr ago.Topamax daily  . HLD (hyperlipidemia)    takes Atorvastatin daily  . Hypertension    takes Lisinopril  and Coreg daily  . Morbid obesity ([place])   . Muscle spasm    takes Flexeril as needed  . Nocturia   . Obstructive sleep apnea   . Peripheral neuropathy    takes Gabapentin daily  . Pneumonia    "walking" several yrs ago and as a baby  . Rectal fissure   . Restless leg   . Urinary frequency   . Varicose veins    Right medial thigh and Left leg     Patient Active Problem List   Diagnosis Date Noted  . Decreased pedal pulses 06/10/2017  . Adjustment disorder with mixed anxiety and depressed mood 11/04/2016  . History of medication noncompliance 11/04/2016  . Drug overdose 10/30/2016  . Polysubstance overdose 10/29/2016  . Unilateral primary osteoarthritis, right knee 10/22/2016  . Diabetic neuropathy ([place]) 07/14/2016  . Syncope 02/25/2016  . De Quervain's tenosynovitis, bilateral 11/01/2015  . Vitamin D deficiency 09/05/2015  . Recurrent candidiasis of vagina 09/05/2015  . Varicose veins of leg with complications 79/89/2119  . Neuropathy, intercostal nerve 12/17/2014  . Rectal itching 11/17/2014  . Restless leg syndrome 10/17/2014  . Chronic sinusitis 07/18/2014  . Headache 07/15/2014  . Vision, loss, sudden 07/12/2014  . Urge incontinence 10/15/2013  . Encounter for chronic pain management 06/30/2013  . Low back pain 01/31/2013  . HLD (hyperlipidemia) 11/19/2012  .  Chest pain 06/27/2012  . Right carpal tunnel syndrome 09/01/2011  . Bilateral knee pain 09/01/2011  . Insulin dependent diabetes mellitus ([place]) 05/22/2008  . Morbid obesity ([place]) 05/22/2008  . 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 Depression 05/22/2008  . Obstructive sleep apnea 05/22/2008  . Essential hypertension 05/22/2008  . Asthma 05/22/2008  . GERD 05/22/2008    Past Surgical History:  Procedure Laterality Date  . CARPAL TUNNEL RELEASE    . CESAREAN SECTION    . KNEE ARTHROSCOPY Right 07/17/2010  . LEFT HEART CATHETERIZATION WITH CORONARY ANGIOGRAM N/A 07/27/2012   Procedure: LEFT HEART  CATHETERIZATION WITH CORONARY ANGIOGRAM;  Surgeon: Sherren Mocha, MD;  Location: [facility] CATH LAB;  Service: Cardiovascular;  Laterality: N/A;  . left knee surgery     screws she thinks  . MASS EXCISION N/A 06/29/2013   Procedure:  WIDE LOCAL EXCISION OF POSTERIOR NECK ABSCESS;  Surgeon: Ralene Ok, MD;  Location: [facility];  Service: General;  Laterality: N/A;    OB History    Gravida Para Term Preterm AB Living   _0 SAB TAB Ectopic Multiple Live Births   1               Home Medications    Prior to Admission medications   Medication Sig Start Date End Date Taking? Authorizing Provider  ACCU-CHEK GUIDE test strip USE T0 CHECK BLOOD SUGAR 3 TIMES DAILY 07/26/17  Yes [name], MD  ACCU-CHEK SOFTCLIX LANCETS lancets Use as instructed to check blood sugar 3 times per day 04/07/17  Yes [name], MD  albuterol (PROVENTIL HFA;VENTOLIN HFA) 108 (90 Base) MCG/ACT inhaler Inhale 1-2 puffs into the lungs every 6 (six) hours as needed for wheezing or shortness of breath. 03/23/17  Yes [name], MD  atorvastatin (LIPITOR) 20 MG tablet Take 1 tablet (20 mg total) by mouth daily. 03/23/17  Yes [name], MD  Blood Glucose Monitoring Suppl (ACCU-CHEK NANO SMARTVIEW) w/Device KIT Use to check blood sugar 3 times daily 04/07/17  Yes [name], MD  carvedilol (COREG) 3.125 MG tablet Take 1 tablet (3.125 mg total) by mouth 2 (two) times daily with a meal. 03/23/17  Yes [name], MD  Cholecalciferol 1000 units tablet Take 1 tablet (1,000 Units total) by mouth daily. 04/05/17  Yes [name], MD  cyclobenzaprine (FLEXERIL) 5 MG tablet Take 1 tablet (5 mg total) by mouth daily as needed for muscle spasms. 03/23/17  Yes [name], MD  gabapentin (NEURONTIN) 600 MG tablet Take 2 tablets (1,200 mg total) by mouth 3 (three) times daily. 06/02/17  Yes [name], MD  HYDROcodone-acetaminophen (NORCO) 10-325 MG tablet Take 1  tablet by mouth every 8 (eight) hours as needed for moderate pain. 08/18/17  Yes [name], MD  insulin aspart (NOVOLOG FLEXPEN) 100 UNIT/ML FlexPen Inject 25 Units into the skin daily with supper. 08/05/17  Yes Hensel, Jamal Collin, MD  Insulin Degludec (TRESIBA FLEXTOUCH) 200 UNIT/ML SOPN Inject 100 Units into the skin daily with breakfast. 08/05/17  Yes Hensel, Jamal Collin, MD  Insulin Pen Needle (NOVOFINE) 32G X 6 MM MISC 1 Units by Does not apply route 3 (three) times daily. 07/28/17  Yes Hensel, Jamal Collin, MD  Insulin Pen Needle (PEN NEEDLES) 31G X 6 MM MISC Use to inject insulin daily 03/23/17  Yes [name], MD  liraglutide (VICTOZA) 18 MG/3ML SOPN Inject 0.3 mLs (1.8 mg  total) into the skin daily. 07/28/17  Yes Hensel, Jamal Collin, MD  lisinopril (PRINIVIL,ZESTRIL) 5 MG tablet Take 1 tablet (5 mg total) by mouth daily. 03/23/17  Yes [name], MD  naloxone [facility]) nasal spray 4 mg/0.1 mL 1 spray into the nose as needed for overdose Patient taking differently: Place 1 spray into the nose once as needed (overdose). 1 spray into the nose as needed for overdose 03/23/17  Yes [name], MD  pantoprazole (PROTONIX) 40 MG tablet Take 1 tablet (40 mg total) by mouth daily. 09/01/17  Yes Lind Covert, MD  SYMBICORT 160-4.5 MCG/ACT inhaler INHALE 2 PUFFS INTO THE LUNGS 2 TIMES DAILY 08/23/17  Yes [name], MD    Family History Family History  Problem Relation Age of Onset  . Diabetes Mother   . Hyperlipidemia Mother   . Depression Mother   . Varicose Veins Mother   . Heart attack Paternal Uncle   . Heart disease Paternal Grandmother   . Heart attack Paternal Grandmother   . Heart attack Paternal Grandfather   . Heart disease Paternal Grandfather   . Heart attack Father   . Cancer Maternal Grandmother        COLON  . Hypertension Maternal Grandmother   . Hyperlipidemia Maternal Grandmother   . Diabetes Maternal Grandmother   . Other  Maternal Grandfather        GUN SHOT    Social History Social History  Substance Use Topics  . Smoking status: Former Smoker    Packs/day: 0.30    Years: 0.30    Types: Cigarettes    Quit date: 12/06/1993  . Smokeless tobacco: Never Used  . Alcohol use No     Allergies   Kiwi extract and Nubain [nalbuphine hcl]   Review of Systems Review of Systems  Constitutional: Negative for activity change and appetite change.  HENT: Negative for congestion and rhinorrhea.   Respiratory: Positive for chest tightness. Negative for shortness of breath.   Cardiovascular: Positive for chest pain.  Gastrointestinal: Positive for nausea. Negative for abdominal pain and vomiting.  Genitourinary: Negative for dysuria, hematuria, vaginal bleeding and vaginal discharge.  Musculoskeletal: Negative for arthralgias and myalgias.  Neurological: Negative for dizziness, weakness, light-headedness and headaches.    all other systems are negative except as noted in the HPI and PMH.    Physical Exam Updated Vital Signs BP (!) 111/49   Pulse 100   Resp (!) 25   Ht _0  (1.575 m)   Wt 136.1 kg (300 lb)   SpO2 95%   BMI 54.87 kg/m   Physical Exam  Constitutional: She is oriented to person, place, and time. She appears well-developed and well-nourished. No distress.  obese  HENT:  Head: Normocephalic and atraumatic.  Mouth/Throat: Oropharynx is clear and moist. No oropharyngeal exudate.  Eyes: Pupils are equal, round, and reactive to light. Conjunctivae and EOM are normal.  Neck: Normal range of motion. Neck supple.  No meningismus.  Cardiovascular: Normal rate, regular rhythm, normal heart sounds and intact distal pulses.   No murmur heard. Pulmonary/Chest: Effort normal and breath sounds normal. No respiratory distress. She exhibits no tenderness.  Abdominal: Soft. There is no tenderness. There is no rebound and no guarding.  Musculoskeletal: Normal range of motion. She exhibits no edema or  tenderness.  Neurological: She is alert and oriented to person, place, and time. No cranial nerve deficit. She exhibits normal muscle tone. Coordination normal.   5/5 strength throughout. CN  2-12 intact.Equal grip strength.   Skin: Skin is warm.  Psychiatric: She has a normal mood and affect. Her behavior is normal.  Nursing note and vitals reviewed.    ED Treatments / Results  Labs (all labs ordered are listed, but only abnormal results are displayed) Labs Reviewed  BASIC METABOLIC PANEL - Abnormal; Notable for the following:       Result Value   Sodium 133 (*)    Potassium 3.4 (*)    Chloride 98 (*)    Glucose, Bld 303 (*)    Calcium 8.8 (*)    All other components within normal limits  CBC - Abnormal; Notable for the following:    WBC 13.1 (*)    All other components within normal limits  BRAIN NATRIURETIC PEPTIDE  D-DIMER, QUANTITATIVE (NOT AT [facility])  I-STAT TROPONIN, ED    EKG  EKG Interpretation  Date/Time:  Saturday September 03 2017 02:53:25 EDT Ventricular Rate:  103 PR Interval:    QRS Duration: 98 QT Interval:  360 QTC Calculation: 472 R Axis:   94 Text Interpretation:  Sinus tachycardia Borderline right axis deviation Borderline low voltage, extremity leads Anterolateral Q wave, probably normal for age No significant change was found Confirmed by Ezequiel Essex [id number]) on 09/03/2017 4:43:46 AM       Radiology Dg Chest 2 View  Result Date: 09/03/2017 CLINICAL DATA:  Left-sided chest pain. EXAM: CHEST  2 VIEW COMPARISON:  10/30/2016 FINDINGS: The cardiomediastinal contours are unchanged, heart upper normal in size. The lungs are clear. Pulmonary vasculature is normal. No consolidation, pleural effusion, or pneumothorax. No acute osseous abnormalities are seen. IMPRESSION: No acute abnormality.  Upper normal heart size. Electronically Signed   By: Jeb Levering M.D.   On: 09/03/2017 03:49    Procedures Procedures (including critical care  time)  Medications Ordered in ED Medications  gi cocktail (Maalox,Lidocaine,Donnatal) (not administered)     Initial Impression / Assessment and Plan / ED Course  I have reviewed the triage vital signs and the nursing notes.  Pertinent labs & imaging results that were available during my care of the patient were reviewed by me and considered in my medical decision making (see chart for details).    Episode of chest pain at rest, now resolved. EKG is normal sinus rhythm without acute ST changes.  Initial troponin is negative. CXR negative.  Heart score is 3  Recommended serial troponins and EKGs with patient as well as D-dimer to evaluate for pulmonary embolism.   Patient is anxious to leave. Discussed with patient that she is not ruled out for myocardial infarction. Recommend additional heart enzymes and D-dimer. Patient refused to leave and will leave [place]. She understands she could be a risk for having a heart attack which could be life-threatening.  Patient appears to have capacity to make medical decisions and will leave [place]. She understands that she can be at risk for having a heart attack, pulmonary embolism, arryhthmia, or other life-threatening pathology.  Final Clinical Impressions(s) / ED Diagnoses   Final diagnoses:  Nonspecific chest pain    New Prescriptions New Prescriptions   No medications on file     Ezequiel Essex, MD 09/03/17 [phone]

## 2017-09-05 NOTE — Telephone Encounter (Signed)
Informed patient of echo results and verbal understanding expressed.  She is frustrated because nothing is explaining her CP. She still has episodes of chest pain, only at night when she lie flat. Last night, she had an episode of CP that radiated down her L arm that was not relieved with 1 tablet of NTG. She continued to rest and the CP went away. Reiterated to her the correct instructions for NTG. She does not associate the pain with eating and she states she does not eat close to bed time. She requests further recommendations from Dr. Burt Knack.

## 2017-09-05 NOTE — Telephone Encounter (Signed)
ECHO did not upload yet but study results received from echo department. Per Dr. Burt Knack, "Reviewed. Essentially normal study."

## 2017-09-07 NOTE — Telephone Encounter (Signed)
Hx normal coronaries by cath. Recent normal echo and benign Holter. Suspect noncardiac chest pain. Should FU with PCP.

## 2017-09-09 ENCOUNTER — Ambulatory Visit (INDEPENDENT_AMBULATORY_CARE_PROVIDER_SITE_OTHER): Payer: Medicaid Other | Admitting: Family Medicine

## 2017-09-09 VITALS — BP 118/76 | HR 105 | Temp 98.1°F | Ht 62.0 in | Wt 298.0 lb

## 2017-09-09 DIAGNOSIS — L0231 Cutaneous abscess of buttock: Secondary | ICD-10-CM | POA: Diagnosis not present

## 2017-09-09 DIAGNOSIS — L02219 Cutaneous abscess of trunk, unspecified: Secondary | ICD-10-CM | POA: Diagnosis not present

## 2017-09-09 DIAGNOSIS — N611 Abscess of the breast and nipple: Secondary | ICD-10-CM

## 2017-09-09 DIAGNOSIS — N764 Abscess of vulva: Secondary | ICD-10-CM

## 2017-09-09 NOTE — Progress Notes (Signed)
Date of Visit: 09/09/2017   HPI:  Patient presents for a same day appointment to discuss boils.  Has abscesses in 3 areas: - butt - draining for 3-4 days. Draining pus and blood. - vaginal area - for 3 days. Draining some pus. - right breast - present since yesterday. Feels warm. Not draining.   Eating and drinking well. No fevers but felt cold. Has history of abscesses in the past but does not usually have to get them drained.  ROS: See HPI  [place]: type 2 diabetes, chronic pain, hypertension, GERD, asthma, hyperlipidemia, OSA  PHYSICAL EXAM: BP 118/76   Pulse (!) 105   Temp 98.1 F (36.7 C) (Oral)   Ht 5\' 2"  (1.575 m)   Wt 298 lb (135.2 kg)   SpO2 97%   BMI 54.50 kg/m  Gen: no acute distress, pleasant, cooperative Skin:  - right breast: area of erythema, induration, and some possible fluctuance on medial aspect of right breast with surrounding erythema and significant tenderness. Overlying skin intact. - left buttock: in the gluteal cleft there is a large area of erythema, induration, fluctuance, and drainage. More medially into the cleft there is a 2cm area of open skin with some overlying slough - groin/mons: R vulvar area with yet another area of erythema, induration, and some drainage.  ASSESSMENT/PLAN:  Patient presents with 3 separate abscesses, all of which likely need further incision & drainage. She is systemically well. This is more than we are equipped to handle today in our clinic, especially as breast abscess may need needle/ultrasound guided drainage.  Fortunately we were able to contact [facility], who were able to work patient in this afternoon at 1:20pm. [facility] will coordinate with breast center on whether she needs imaging guided drainage of the breast abscess. Patient agreeable to being seen there and will go this afternoon.   FOLLOW UP: Being seen at [facility] this afternoon.  [place] [facility]. [name], [place]

## 2017-09-09 NOTE — Telephone Encounter (Signed)
Reiterated to patient she has hx of normal coronaries by cath, recent normal echo and benign monitor. Instructed her to contact PCP regarding noncardiac CP. She agreed with treatment plan.

## 2017-09-09 NOTE — Patient Instructions (Signed)
Please go to [facility] surgery this afternoon so they can see you and drain these abscesses.  I hope you feel better very soon. [name], MD

## 2017-09-26 ENCOUNTER — Ambulatory Visit (INDEPENDENT_AMBULATORY_CARE_PROVIDER_SITE_OTHER): Payer: Medicaid Other

## 2017-09-26 ENCOUNTER — Ambulatory Visit (INDEPENDENT_AMBULATORY_CARE_PROVIDER_SITE_OTHER): Payer: Medicaid Other | Admitting: Podiatry

## 2017-09-26 DIAGNOSIS — I70245 Atherosclerosis of native arteries of left leg with ulceration of other part of foot: Secondary | ICD-10-CM

## 2017-09-26 DIAGNOSIS — L97522 Non-pressure chronic ulcer of other part of left foot with fat layer exposed: Secondary | ICD-10-CM | POA: Diagnosis not present

## 2017-09-26 DIAGNOSIS — E0842 Diabetes mellitus due to underlying condition with diabetic polyneuropathy: Secondary | ICD-10-CM | POA: Diagnosis not present

## 2017-09-26 DIAGNOSIS — L97401 Non-pressure chronic ulcer of unspecified heel and midfoot limited to breakdown of skin: Secondary | ICD-10-CM

## 2017-09-26 MED ORDER — AMOXICILLIN-POT CLAVULANATE 875-125 MG PO TABS: 1.0000 | ORAL_TABLET | Freq: Two times a day (BID) | ORAL | 1 refills | Status: DC

## 2017-09-27 ENCOUNTER — Telehealth: Payer: Self-pay | Admitting: *Deleted

## 2017-09-27 ENCOUNTER — Other Ambulatory Visit: Payer: Self-pay | Admitting: Family Medicine

## 2017-09-27 DIAGNOSIS — E1142 Type 2 diabetes mellitus with diabetic polyneuropathy: Secondary | ICD-10-CM

## 2017-09-27 DIAGNOSIS — L97421 Non-pressure chronic ulcer of left heel and midfoot limited to breakdown of skin: Secondary | ICD-10-CM

## 2017-09-27 DIAGNOSIS — L97522 Non-pressure chronic ulcer of other part of left foot with fat layer exposed: Secondary | ICD-10-CM

## 2017-09-27 NOTE — Telephone Encounter (Addendum)
-----   Message from Edrick Kins, [place] sent at 09/26/2017  6:03 PM EDT ----- Regarding: MRI left heel Please order MRI left heel w/ or w/out contrast.   Dx: Diabetic foot ulcer left heel x 3 months. Possible osteomyelitis.  Thanks, Dr. Amalia Hailey.09/28/2017-Case started with [employer] for pre-cert MRI left heel, waiting clinicals of 09/26/2017.09/29/2017-Received 09/26/2017 clinicals and faxed with [place] form: [id number] to [facility] for evaluation for prior authorization.

## 2017-09-28 NOTE — Progress Notes (Signed)
   Subjective:  Patient with a history of diabetes mellitus presents today for evaluation of an ulceration to the left heel. She states she recently completed a course of Keflex. Patient presents today for further treatment and evaluation   Objective/Physical Exam General: The patient is alert and oriented x3 in no acute distress.  Dermatology:  Wound #1 noted to the left heel measuring approximately 2.0 x 2.0 x 0.8 cm (LxWxD).   To the noted ulceration(s), there is no eschar. There is a moderate amount of slough, fibrin, and necrotic tissue noted. Granulation tissue and wound base is red. There is a minimal amount of serosanguineous drainage noted. There is no exposed bone muscle-tendon ligament or joint. There is no malodor. Periwound integrity is intact. Skin is warm, dry and supple bilateral lower extremities.  Vascular: Palpable pedal pulses bilaterally. No edema or erythema noted. Capillary refill within normal limits.  Neurological: Epicritic and protective threshold absent bilaterally.   Musculoskeletal Exam: Range of motion within normal limits to all pedal and ankle joints bilateral. Muscle strength 5/5 in all groups bilateral.   Assessment: #1 left heel ulcer secondary to diabetes mellitus #2 diabetes mellitus w/ peripheral neuropathy   Plan of Care:  #1 Patient was evaluated. #2 medically necessary excisional debridement including muscle and deep fascial tissue was performed using a tissue nipper and a chisel blade. Excisional debridement of all the necrotic nonviable tissue down to healthy bleeding viable tissue was performed with post-debridement measurements same as pre-. #3 The wound was cleansed and dry sterile dressing applied. #4 Cultures taken from wound. #5 Prescription for Augmenting 875/125 mg given to patient. #6 Order for MRI to rule out osteomyelitis.  #7 Return to clinic in 2 weeks.   Edrick Kins, DPM [facility]  Dr. Edrick Kins,  [address]                                        [place], [place] [address]                Office [phone]  Fax [phone]

## 2017-10-01 LAB — WOUND CULTURE
MICRO NUMBER:: 81184519
SPECIMEN QUALITY:: ADEQUATE

## 2017-10-03 NOTE — Addendum Note (Signed)
Addended by: Harriett Sine D on: 10/03/2017 12:08 PM   Modules accepted: Orders

## 2017-10-03 NOTE — Telephone Encounter (Addendum)
[employer] denied MRI left heel without contrast, states with and without is indicated. Ordered MRI of left heel with and without contrast. Faxed Case Summary - Case/Authorization form, clinicals, demographics and top sheet of [employer] - Notice of Decision on Initial Requet for Medicaid Services to [employer].

## 2017-10-06 ENCOUNTER — Other Ambulatory Visit: Payer: Self-pay | Admitting: Family Medicine

## 2017-10-06 NOTE — Telephone Encounter (Signed)
Received [employer] - Medicaid denial of MRI left heel with and without contrast. Routed message to Dr. Amalia Hailey to perform PEER to PEER prior to 10/11/2017 for Case # [id number].

## 2017-10-10 ENCOUNTER — Ambulatory Visit: Payer: Medicaid Other | Admitting: Podiatry

## 2017-10-10 ENCOUNTER — Encounter: Payer: Self-pay | Admitting: Podiatry

## 2017-10-10 ENCOUNTER — Telehealth: Payer: Self-pay | Admitting: Family Medicine

## 2017-10-10 DIAGNOSIS — E0842 Diabetes mellitus due to underlying condition with diabetic polyneuropathy: Secondary | ICD-10-CM

## 2017-10-10 DIAGNOSIS — I70245 Atherosclerosis of native arteries of left leg with ulceration of other part of foot: Secondary | ICD-10-CM

## 2017-10-10 DIAGNOSIS — L97522 Non-pressure chronic ulcer of other part of left foot with fat layer exposed: Secondary | ICD-10-CM

## 2017-10-10 NOTE — Telephone Encounter (Addendum)
Called patient to discuss ABIs. Her result had come back normal (with waveforms bilaterally showing adequate perfusion). However, her brachial pressures were unequal. Unsure of the significance of this - her echo was normal, and she has had prior CTA's of her chest showing normal aorta.  Discussed option for referral to vascular surgery for evaluation of this. Patient has appointment with me on Friday and she prefers to wait until that appointment for [place] to recheck her blood pressures in both arms at that visit, rather than just sending her to vascular surgery. She states she has had no chest pain or syncope at all recently. Has been feeling well.   Reports the surgeons drained her buttock abscess and breast abscess that I saw her for recently. She also reports she has had an infection of her foot which is being worked up by podiatry for possible bone infection.  I will see her in clinic Friday & we'll decide on further management from there.  [name], MD

## 2017-10-11 ENCOUNTER — Telehealth: Payer: Self-pay | Admitting: *Deleted

## 2017-10-11 NOTE — Telephone Encounter (Signed)
Marcy Siren, I don't see a phone # to call. Can you get me the number? Thanks, Dr. Amalia Hailey

## 2017-10-11 NOTE — Telephone Encounter (Signed)
"  It's Dorian Pod from [place].  I'm calling about [name].  It looks like her MRI has been denied twice but it's for the wrong cpt code.  It's for a heel with and without, the code is 73720.  So, I wanted to see if you could try and get a new authorization for 73720.  Call me back if you have any additional questions."

## 2017-10-12 ENCOUNTER — Telehealth: Payer: Self-pay | Admitting: *Deleted

## 2017-10-12 DIAGNOSIS — M86172 Other acute osteomyelitis, left ankle and foot: Secondary | ICD-10-CM

## 2017-10-12 NOTE — Telephone Encounter (Signed)
[employer] denied the MRI [id number], request clinicals for evaluation.

## 2017-10-12 NOTE — Telephone Encounter (Signed)
-----   Message from Cheryle Horsfall sent at 10/11/2017 10:57 AM EST ----- Regarding: Prior Authorization Hi Delydia.  I left you a voice mail regarding this patient, wanted to be sure you received it.  Also I copied Marcy Siren in on this message in case you are out of the office.  Per [employer] authorization has been denied for two separate codes, however those are not the codes patient is having performed. That is more than likely the reason they were denied.  Could you please obtain prior authorization for CPT 73720 for [facility] 10-14-2017.  Dr is trying to rule out bone infection per the patient.  [facility] is trying to avoid canceling this appointment due to no authorization.    Thank you for your help. Dorian Pod

## 2017-10-13 NOTE — Telephone Encounter (Signed)
[name] stated they tried to get the 73720 authorized and it was still denied authorization.

## 2017-10-14 ENCOUNTER — Telehealth: Payer: Self-pay | Admitting: Family Medicine

## 2017-10-14 ENCOUNTER — Other Ambulatory Visit: Payer: Self-pay

## 2017-10-14 ENCOUNTER — Ambulatory Visit (INDEPENDENT_AMBULATORY_CARE_PROVIDER_SITE_OTHER): Payer: Medicaid Other | Admitting: Family Medicine

## 2017-10-14 ENCOUNTER — Encounter: Payer: Self-pay | Admitting: Family Medicine

## 2017-10-14 ENCOUNTER — Telehealth: Payer: Self-pay | Admitting: *Deleted

## 2017-10-14 DIAGNOSIS — E119 Type 2 diabetes mellitus without complications: Secondary | ICD-10-CM

## 2017-10-14 DIAGNOSIS — Z794 Long term (current) use of insulin: Secondary | ICD-10-CM | POA: Diagnosis not present

## 2017-10-14 DIAGNOSIS — E1142 Type 2 diabetes mellitus with diabetic polyneuropathy: Secondary | ICD-10-CM | POA: Diagnosis not present

## 2017-10-14 DIAGNOSIS — L98499 Non-pressure chronic ulcer of skin of other sites with unspecified severity: Secondary | ICD-10-CM | POA: Insufficient documentation

## 2017-10-14 DIAGNOSIS — IMO0001 Reserved for inherently not codable concepts without codable children: Secondary | ICD-10-CM

## 2017-10-14 DIAGNOSIS — E559 Vitamin D deficiency, unspecified: Secondary | ICD-10-CM | POA: Diagnosis not present

## 2017-10-14 DIAGNOSIS — L98491 Non-pressure chronic ulcer of skin of other sites limited to breakdown of skin: Secondary | ICD-10-CM | POA: Diagnosis not present

## 2017-10-14 DIAGNOSIS — Z800 Family history of malignant neoplasm of digestive organs: Secondary | ICD-10-CM

## 2017-10-14 NOTE — Patient Instructions (Signed)
Buy the capsaicin cream at [place] the area on your stomach covered and dry with a non-stick pad  Follow up with me in 1 month  Be well, Dr. [name]

## 2017-10-14 NOTE — Assessment & Plan Note (Signed)
Sugars controlled by patient's report.  She is not due for A1c until next month.  We will have her follow-up in 1 month for recheck of A1c.

## 2017-10-14 NOTE — Telephone Encounter (Signed)
Patient left message on nurse line stating that pharmacy told her that her refill on norco required prior auth. Formulary and preferred drug list placed in PCP box.

## 2017-10-14 NOTE — Telephone Encounter (Signed)
Prior auth form completed. Since the office is closed this afternoon and staff are not available to handle the PA, I called [facility] and was advised I needed to fax the form in, they cannot do an opiate PA over the phone.  I went ahead and faxed it back to [facility] so patient can get her medications ASAP.  [name], MD

## 2017-10-14 NOTE — Telephone Encounter (Signed)
Will still return original form to RN office [name], MD

## 2017-10-14 NOTE — Telephone Encounter (Signed)
Called patient to discuss screening colonoscopy since we did not come up with a plan for this at today's visit.  I offered referral to gastroenterology to discuss whether she needs an early colonoscopy given her family history, without a first-degree relative with colorectal cancer.  She is agreeable to this plan.  I will enter referral.  [name], MD

## 2017-10-14 NOTE — Progress Notes (Addendum)
Date of Visit: 10/14/2017   HPI:  Diabetes: Currently taking Tresiba 100 units daily and NovoLog 25 units daily with supper.  Checking her sugar at least once a day.  In the mornings it runs in the 70s, and the rest of the day it tends to be under 200s.  She feels like she is doing well on this regimen.  Not due for A1c until next month.  Diabetic neuropathy: Taking gabapentin 1200 mg 3 times a day.  Is still having a lot of issues with painful diabetic neuropathy at night.  Has pins and needles feeling in her feet.  It inhibits her from sleeping well.  She wants to know what else she can try.  Sore on stomach: Has had a ulcerated area on her right lower pannus for approximately 2-3 months.  It is not painful and has not been draining.  It did at one time have a scab on it.  Colon cancer screening: Patient reports she has a history of colon cancer in her grandmother and in her great uncle, both on her mother's side of the family.  She is not sure what age they were when they were diagnosed.  She believes she needs a colonoscopy.  Foot ulcer: Has been following with podiatry lately for an ulcer on her foot.  They are attempting to schedule an MRI of her foot to rule out osteomyelitis, but are having trouble getting it approved by her insurance.  She currently is taking Augmentin twice daily.  She has been on that for about a month.  Follow-up of ABIs: Had ABIs done which showed adequate waveforms suggesting adequate perfusion of her feet.  Of note, there were differing brachial blood pressures bilaterally.  Patient denies any lightheadedness, chest pain, or syncope of late.  ROS: See HPI.  [place]: History of type 2 diabetes, chronic pain, hypertension, GERD, asthma, hyperlipidemia, vitamin D deficiency, morbid obesity  PHYSICAL EXAM: BP 108/78 (BP Location: Right Arm)   Pulse 89   Temp 97.7 F (36.5 C) (Oral)   Ht 5\' 2"  (1.575 m)   Wt 298 lb 6.4 oz (135.4 kg)   SpO2 96%   BMI 54.58 kg/m   Gen: No acute distress, pleasant, cooperative HEENT: Normocephalic, atraumatic Heart: Regular rate and rhythm, no murmur Lungs: Clear to auscultation bilaterally, normal respiratory effort Neuro: Alert, grossly nonfocal, speech normal Ext: Left heel with 1 cm ulcer that is approximately 1 cm deep.  No purulence expressed from the ulcer.  Mildly tender to palpation around it. Skin: Right lower aspect of pannus with oval-shaped 1 x 2 cm superficial ulceration with chronic appearing surrounding erythema that is several millimeters wide.  No purulence or tenderness to palpation.  There are other lesions in the same area in a linear distribution.  One other ulcerated area that is about 1 cm x 1 cm.  Some scabbing of other lesions.    ASSESSMENT/PLAN:  Health maintenance:  -Up-to-date on health maintenance items -Patient believes she needs a colonoscopy.  She has no first-degree relatives with colorectal cancer.  I am going to offer for her to see a gastroenterologist to discuss whether she needs colon cancer screening, as I could not find any evidence that she is definitively at increased risk given her lack of a first-degree relative.  We did not have this discussion before she left, so I will call her today and discuss.  Insulin dependent diabetes mellitus ([place]) Sugars controlled by patient's report.  She is not due for  A1c until next month.  We will have her follow-up in 1 month for recheck of A1c.  Diabetic neuropathy ([name]) Significant neuropathy symptoms despite maximum dose of gabapentin.  We will try topical capsaicin therapy.  We looked at the pricing at [facility] and believe she will be able to pay for it.  We also were able to find a coupon for her to have it at a discounted price.  Recheck at visit next month.  Could consider switching to Lyrica or addition of a TCA, but I am hesitant to give any mood altering medications given her history of psychiatric comorbidity.  Vitamin D  deficiency Plan to recheck vitamin D level at visit next month.  Discussed with patient today.  Ulcer of skin ([place]) Ulcers on stomach appeared to be due to intertriginous rubbing of skin.  Advised keeping the area covered with a nonstick pad, and letting it dry out as much as possible.  No signs of superinfection, and patient is on Augmentin anyways for her foot ulcer.  Continue to monitor at future visits.  Foot ulcer Continue to follow with podiatry for this.  Brachial pressure differences These were repeated today with a blood pressure of 100/82 in the left arm, and 108/78 in the right arm.  No further workup needed given concordant blood pressures.  FOLLOW UP: Follow up in 1 month for above issues  [name], [place]

## 2017-10-14 NOTE — Assessment & Plan Note (Addendum)
>>  ASSESSMENT AND PLAN FOR TYPE 2 DIABETES MELLITUS WITH DIABETIC NEUROPATHY, UNSPECIFIED ([place]) WRITTEN ON 10/14/2017  2:49 PM BY Khalie Wince, [name], MD  Significant neuropathy symptoms despite maximum dose of gabapentin.  We will try topical capsaicin therapy.  We looked at the pricing at [facility] and believe she will be able to pay for it.  We also were able to find a coupon for her to have it at a discounted price.  Recheck at visit next month.  Could consider switching to Lyrica or addition of a TCA, but I am hesitant to give any mood altering medications given her history of psychiatric comorbidity.  >>ASSESSMENT AND PLAN FOR INSULIN DEPENDENT TYPE 2 DIABETES MELLITUS ([place]) WRITTEN ON 10/14/2017  2:47 PM BY Tanesha Arambula, [name], MD  Sugars controlled by patient's report.  She is not due for A1c until next month.  We will have her follow-up in 1 month for recheck of A1c.

## 2017-10-14 NOTE — Assessment & Plan Note (Signed)
Ulcers on stomach appeared to be due to intertriginous rubbing of skin.  Advised keeping the area covered with a nonstick pad, and letting it dry out as much as possible.  No signs of superinfection, and patient is on Augmentin anyways for her foot ulcer.  Continue to monitor at future visits.

## 2017-10-14 NOTE — Assessment & Plan Note (Signed)
Plan to recheck vitamin D level at visit next month.  Discussed with patient today.

## 2017-10-14 NOTE — Telephone Encounter (Signed)
[facility]to[employer] never called me. So no, I haven't gotten it.

## 2017-10-18 IMAGING — CR DG KNEE COMPLETE 4+V*R*
4 series · 4 of 4 positions shown · non-contrast
Comparison: 05/09/2012

CLINICAL DATA: Right knee pain for 1 month.  No injury.

EXAM:
RIGHT KNEE - COMPLETE 4+ VIEW

[knee ap]
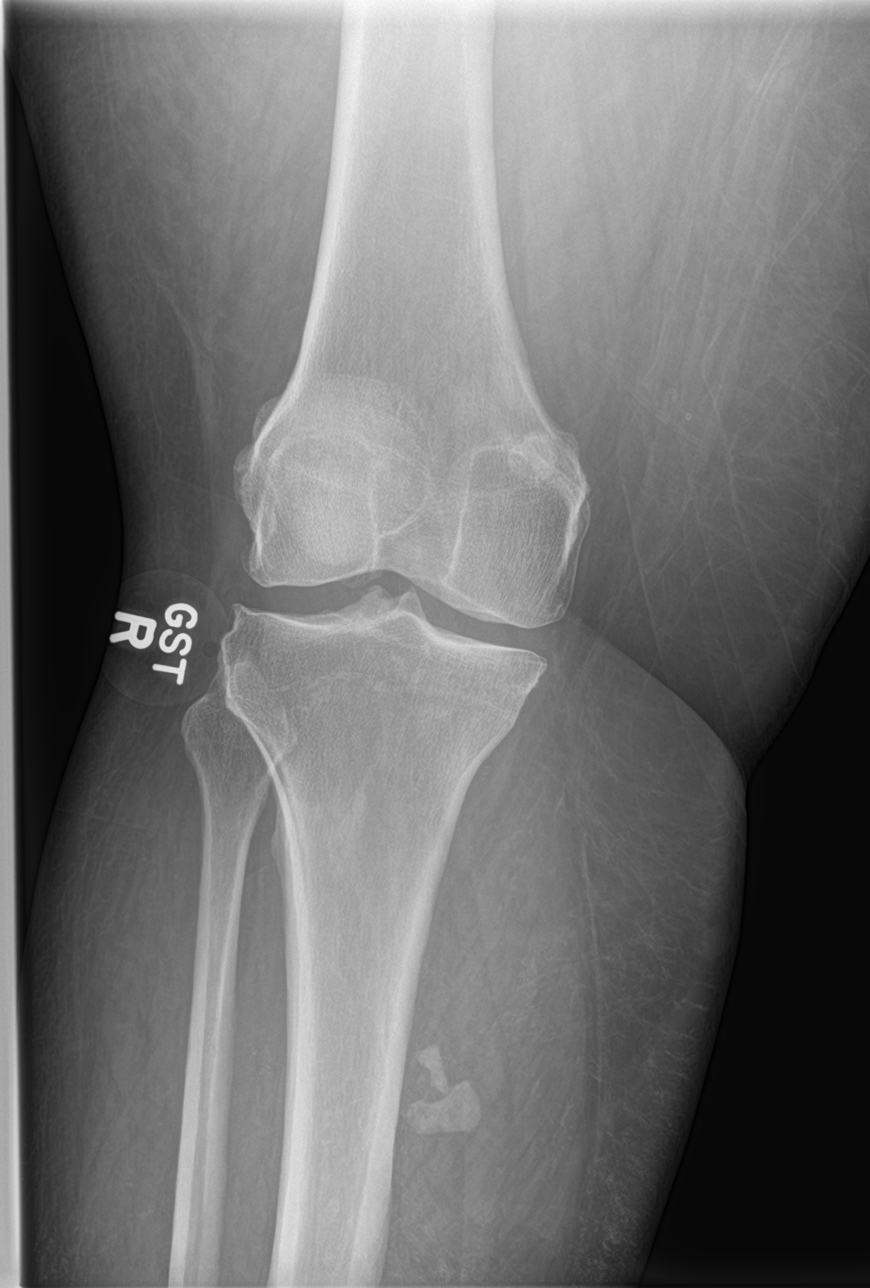

[knee lat]
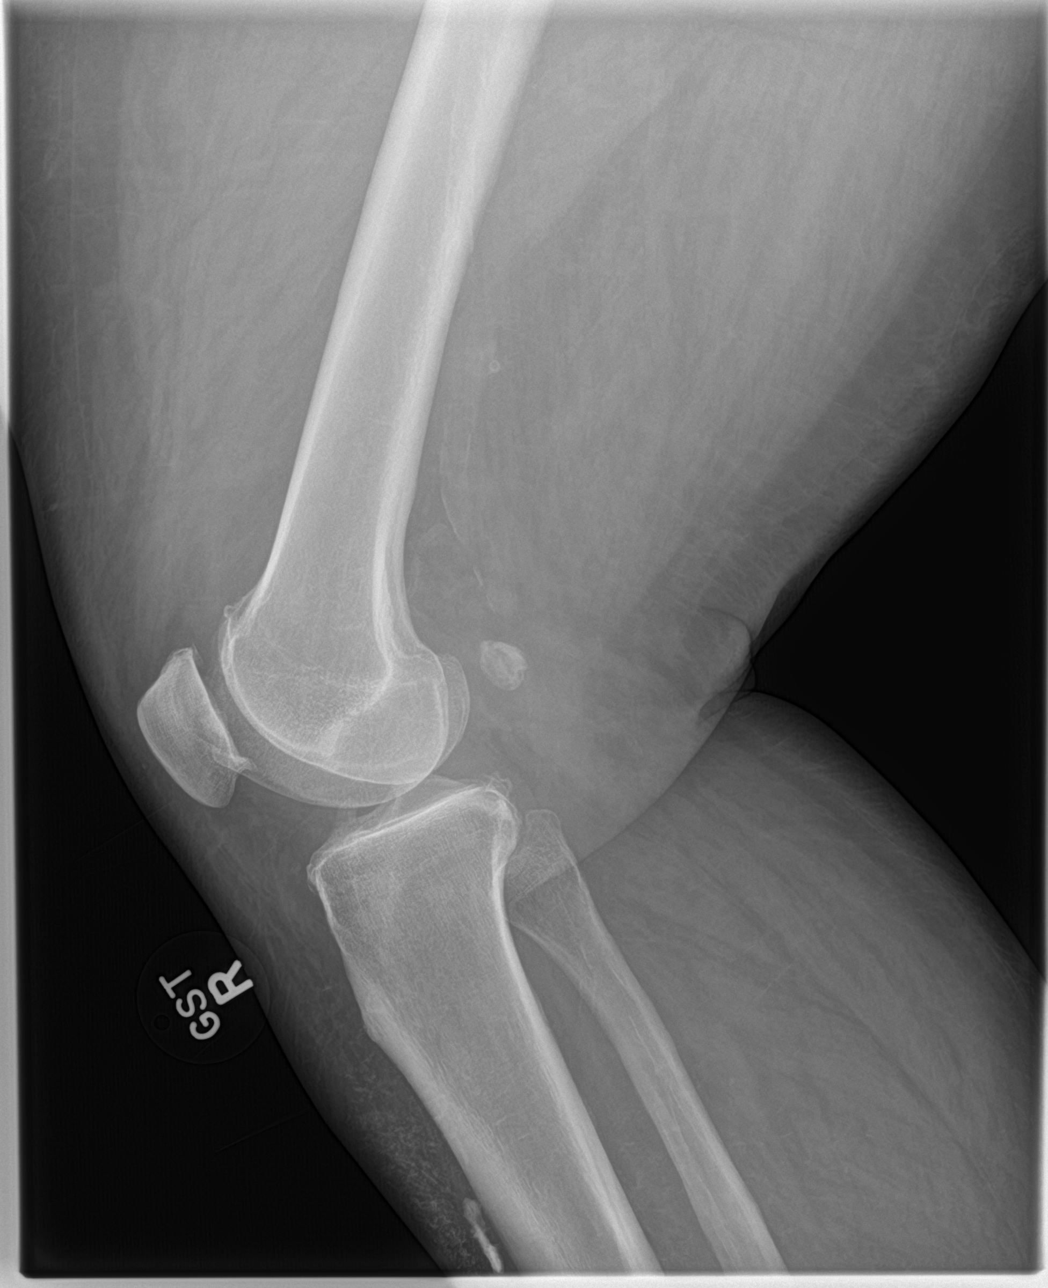

[knee obl (1 of 2)]
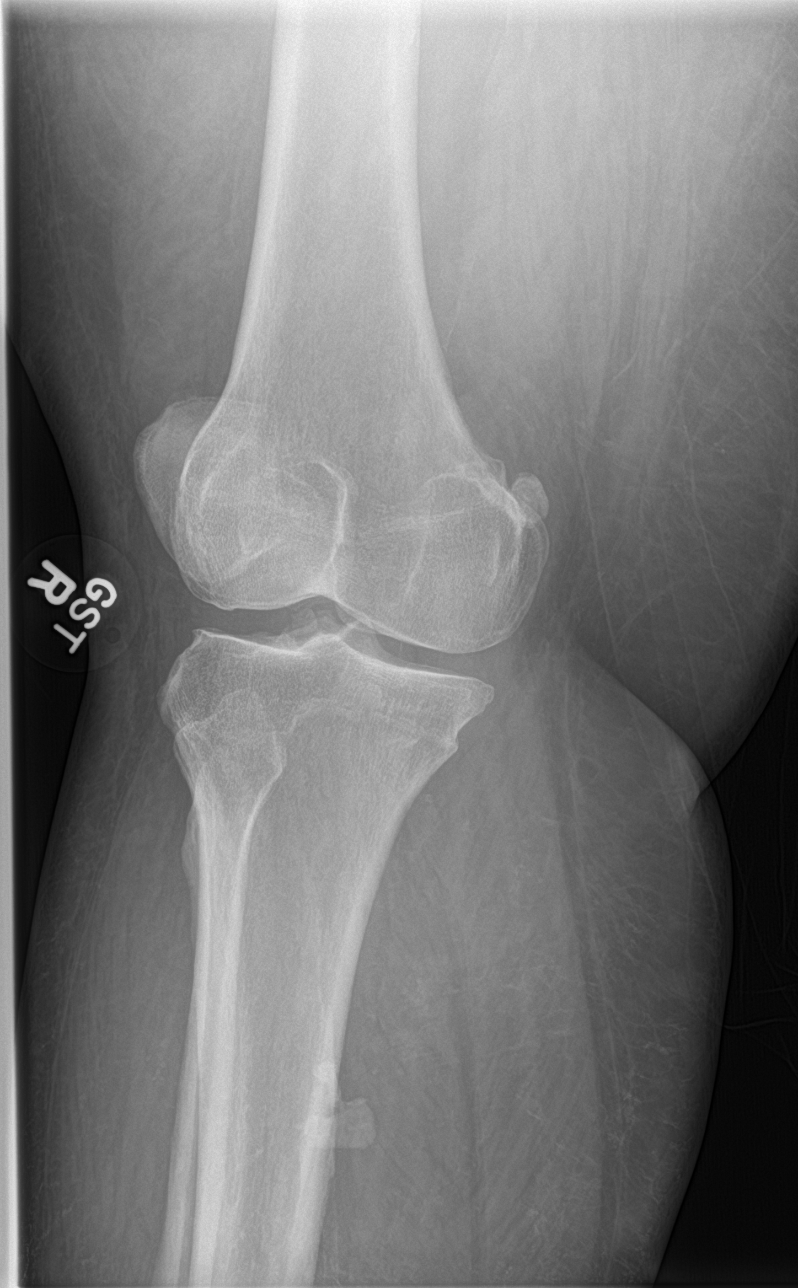

[knee obl (2 of 2)]
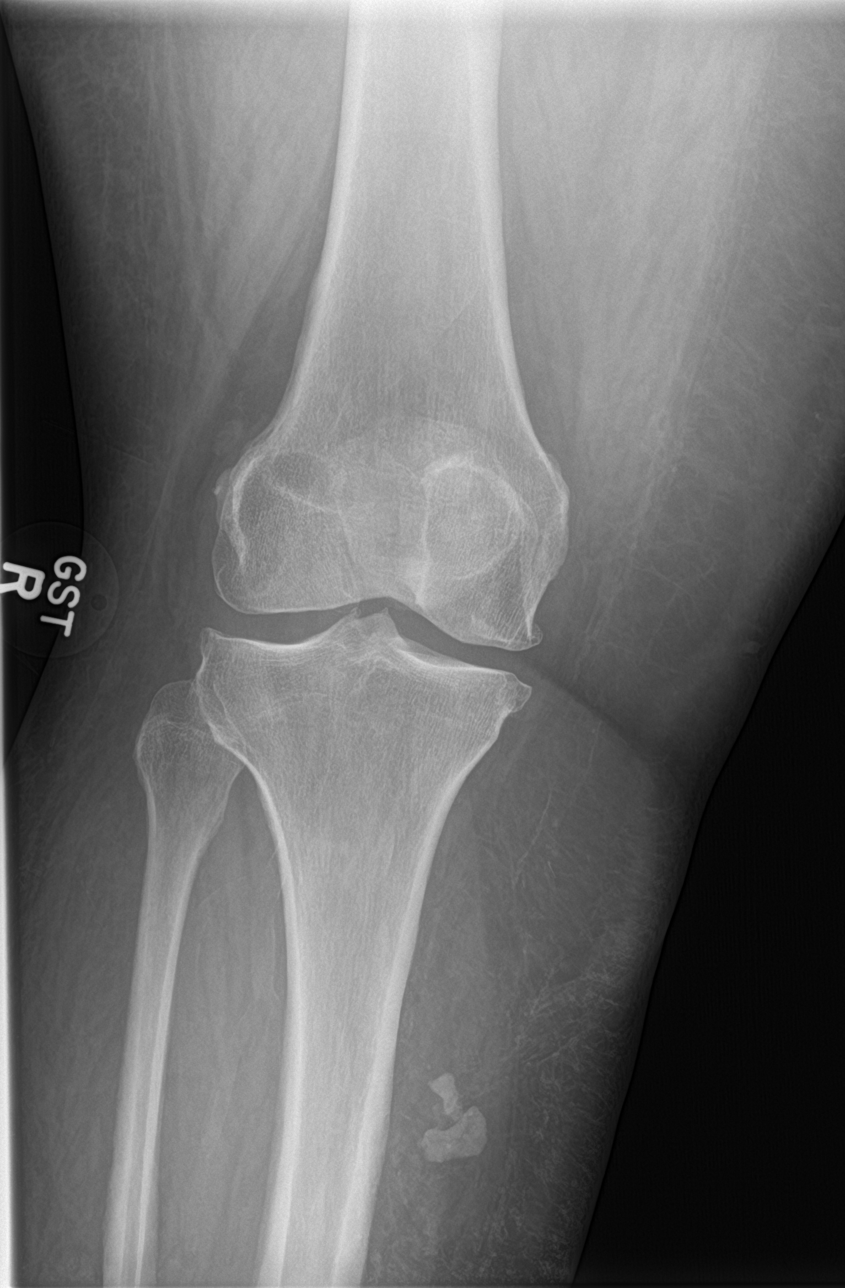

[4 of 4 positions shown; findings below may reference images not displayed]

FINDINGS: Mild degenerative changes in the right knee with medial greater than
lateral compartment narrowing an tricompartment osteophyte
formation. No evidence of acute fracture or dislocation. There
appears to be a loose body in the medial aspect of the lateral
compartment anteriorly. No significant effusion. Soft tissue
calcifications adjacent to the proximal tibia without change since
prior study.
IMPRESSION: Mild degenerative changes in the right knee with apparent loose body
in the lateral compartment. No acute fracture or dislocation.

## 2017-10-18 NOTE — Progress Notes (Signed)
   Subjective:  Patient with a history of diabetes mellitus presents today for evaluation of an ulceration to the left heel. Patient continues to take Augmentin oral antibiotics. Patient believes the ulceration is doing better. She notices some improvement with pain. She is been dressing the ulcer at home daily.    Objective/Physical Exam General: The patient is alert and oriented x3 in no acute distress.  Dermatology:  Wound #1 noted to the left heel measuring approximately 2.0 x 2.0 x 0.8 cm (LxWxD).   To the noted ulceration(s), there is no eschar. There is a moderate amount of slough, fibrin, and necrotic tissue noted. Granulation tissue and wound base is red. There is a minimal amount of serosanguineous drainage noted. There is no exposed bone muscle-tendon ligament or joint. There is no malodor. Periwound integrity is intact. Skin is warm, dry and supple bilateral lower extremities.  Vascular: Palpable pedal pulses bilaterally. No edema or erythema noted. Capillary refill within normal limits.  Neurological: Epicritic and protective threshold absent bilaterally.   Musculoskeletal Exam: Range of motion within normal limits to all pedal and ankle joints bilateral. Muscle strength 5/5 in all groups bilateral.   Assessment: #1 left heel ulcer secondary to diabetes mellitus #2 diabetes mellitus w/ peripheral neuropathy   Plan of Care:  #1 Patient was evaluated.Wound cultures were reviewed today.  #2 medically necessary excisional debridement including muscle and deep fascial tissue was performed using a tissue nipper and a chisel blade. Excisional debridement of all the necrotic nonviable tissue down to healthy bleeding viable tissue was performed with post-debridement measurements same as pre-. #3 The wound was cleansed and dry sterile dressing applied. #4 continue oral Augmentin #5. 2. Review needs to be performed to have the MRI completed to rule out osteomyelitis of the calcaneus.  Please note the MRIs medically necessary for preoperative surgical planning to determine the extent of infection #6 return to clinic in 2 weeks to review MRI results   Edrick Kins, DPM [facility]  Dr. Edrick Kins, [address]                                        [place], [place] [address]                Office [phone]  Fax [phone]

## 2017-10-18 NOTE — Telephone Encounter (Signed)
Had to re-request authorization for the MRI. New Case# is [id number]

## 2017-10-19 ENCOUNTER — Telehealth: Payer: Self-pay | Admitting: *Deleted

## 2017-10-19 IMAGING — DX DG KNEE COMPLETE 4+V*R*
4 series · 4 of 4 positions shown · non-contrast
Comparison: RIGHT knee radiograph September 08, 2016

CLINICAL DATA: Worsening RIGHT knee pain with flexion for 1 month.
Felt a pop today with worsening pain. History of RIGHT knee
arthroscopy 5600.

EXAM:
RIGHT KNEE - COMPLETE 4+ VIEW

[knee ap]
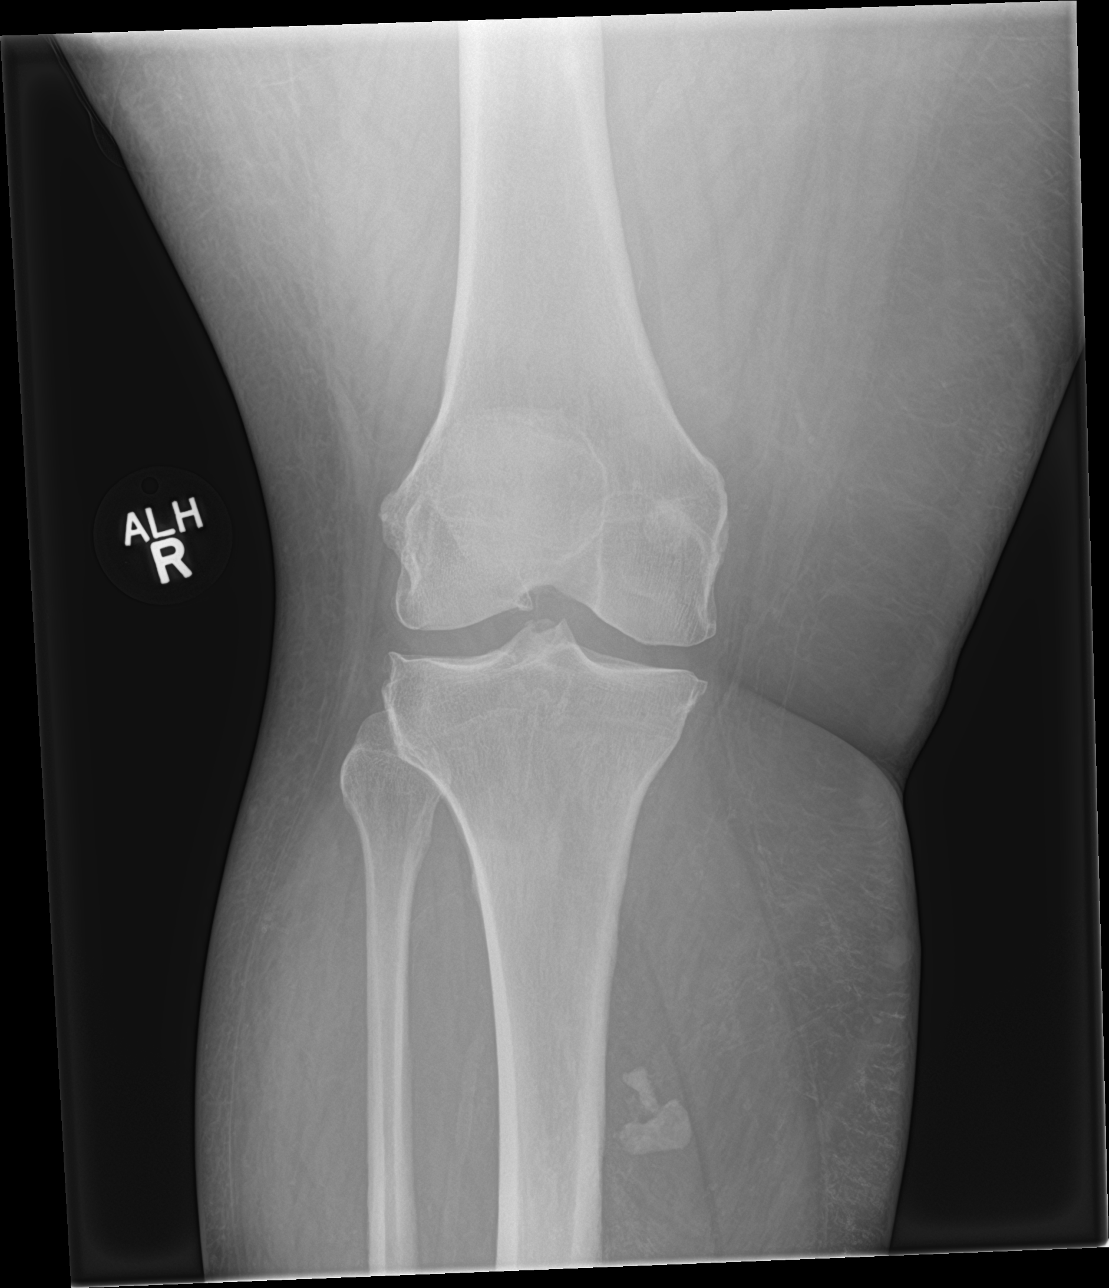

[knee lat]
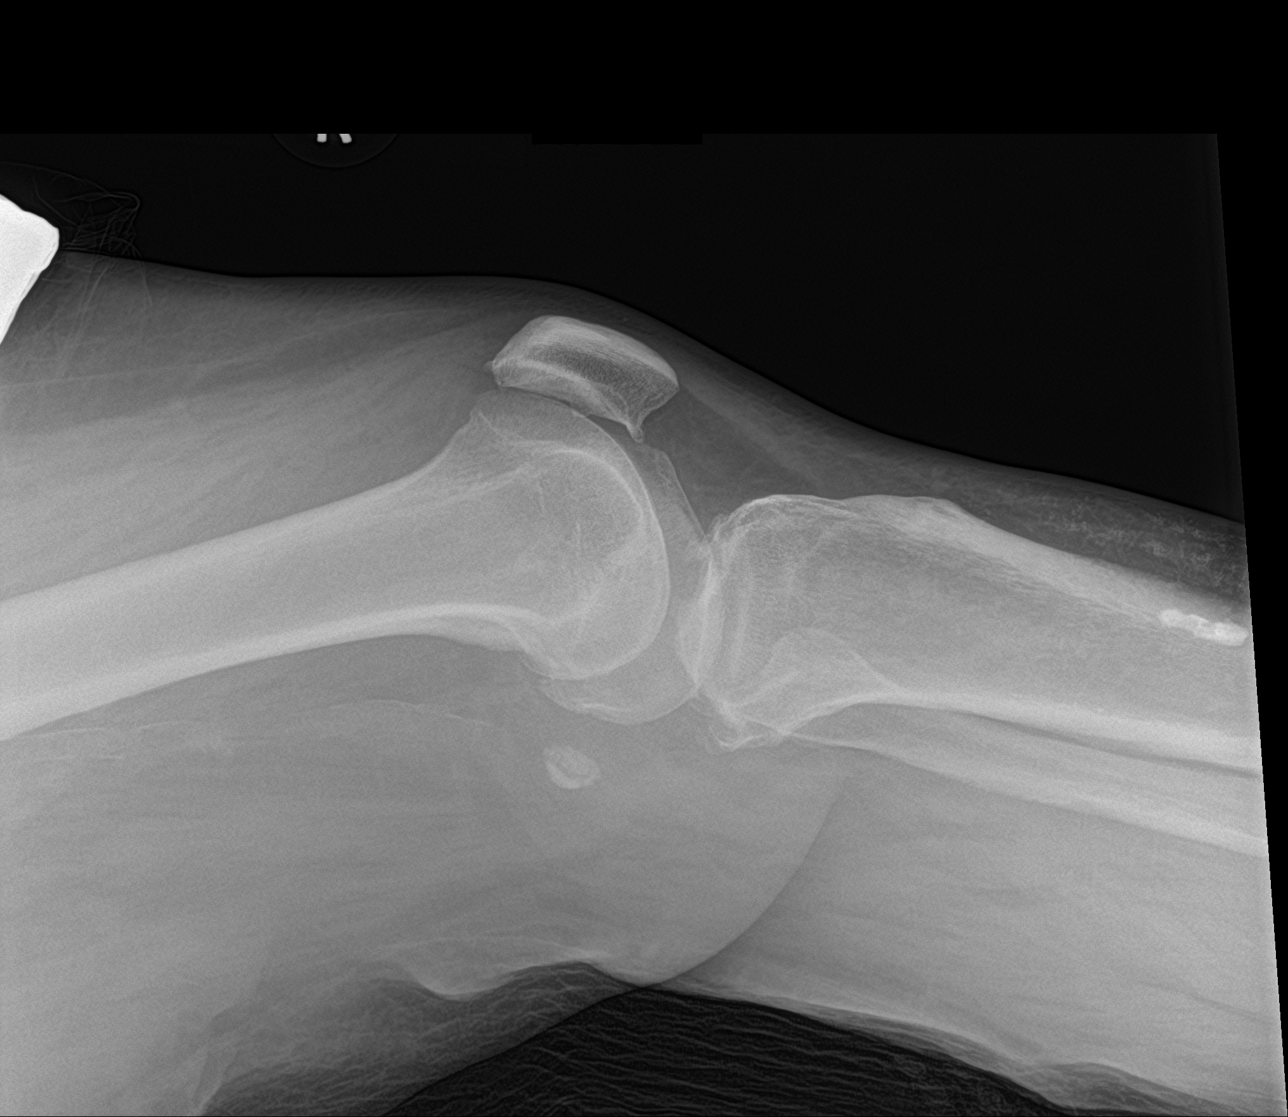

[knee obl (1 of 2)]
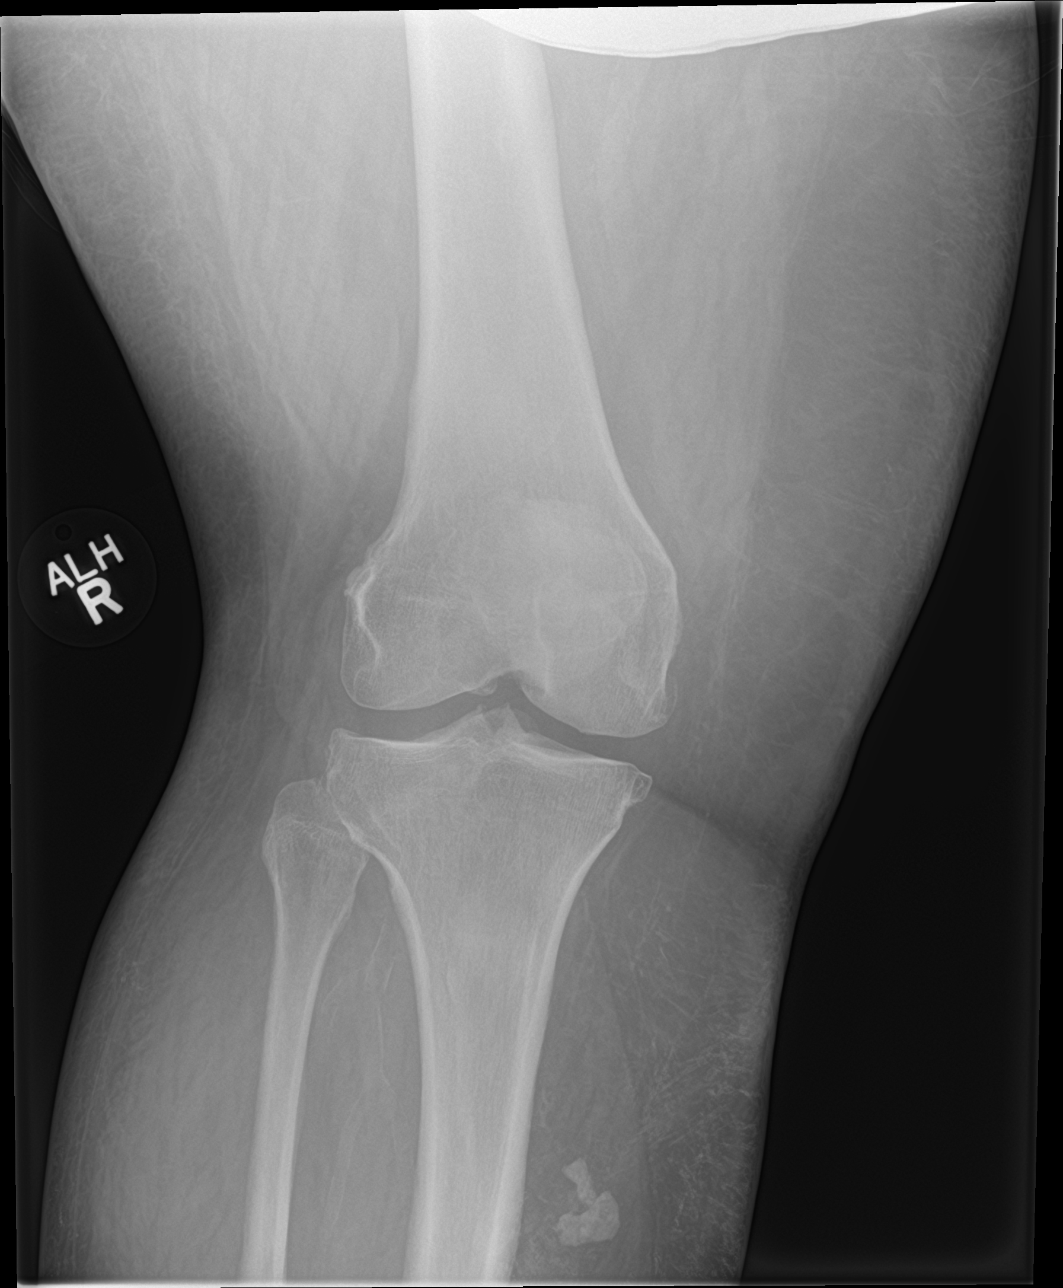

[knee obl (2 of 2)]
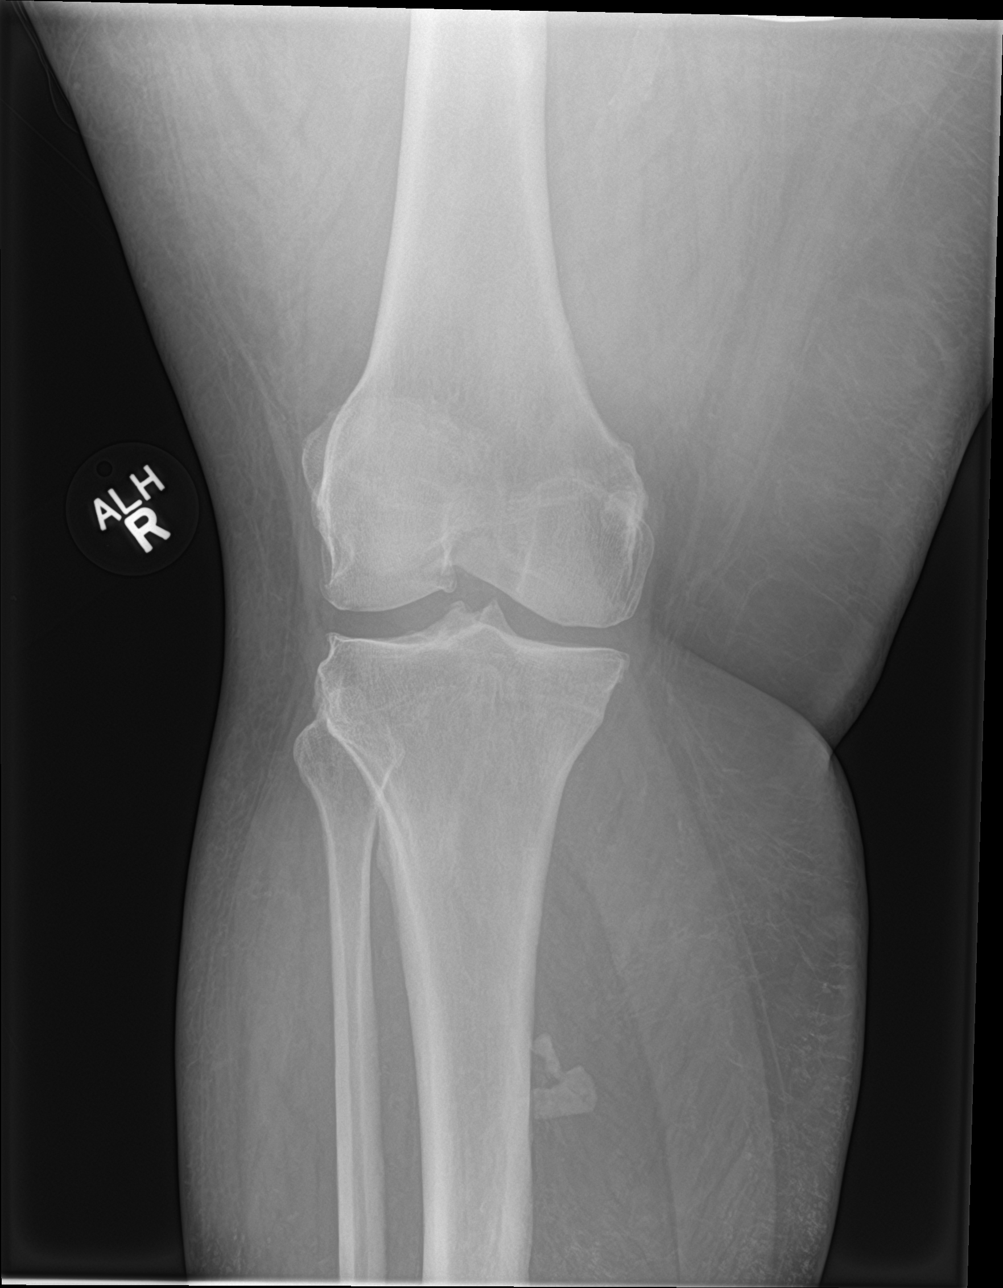

[4 of 4 positions shown; findings below may reference images not displayed]

FINDINGS: No acute fracture deformity dislocation. Tibial spine peaking with
mild tricompartmental marginal spurring. No destructive bony
lesions. Heterotopic ossification proximal pretibial soft tissues.
No discrete loose body on today's radiograph. Moderate vascular
calcifications. Large body habitus.
IMPRESSION: No acute fracture deformity or dislocation.

Mild tricompartmental osteoarthrosis.

Moderate atherosclerosis, advanced for age.

## 2017-10-19 NOTE — Telephone Encounter (Signed)
I informed pt Dr. Amalia Hailey had gotten prior authorization for the MRI and she could schedule.

## 2017-10-19 NOTE — Telephone Encounter (Signed)
Received notice from pharmacy that approval is still not going thru.   Called [employer] and they gave the approval for a 30 day maintenance - valid 10/16/17.  This is until they get supporting documents. I have refaxed them again today ( refaxed to [phone].  PA code: [id number].  Ref code: [id number]  [place], they still cant get the PA to go thru. Janice Norrie, Pharmacist @ [facility] will call to get clarification.  John called [place] back.  [employer] is telling them it is still denied. Will check with [employer] later this afternoon, to see if they received supporting documentation.  Jenny Reichmann is aware of plan and so is patient. Gionni Vaca, Salome Spotted, CMA

## 2017-10-19 NOTE — Telephone Encounter (Signed)
Contacted [place] [employer], medication approved for 30 days. Ref # [id number]. Patient informed.

## 2017-10-19 NOTE — Telephone Encounter (Signed)
Dr. Amalia Hailey received prior authorization [employer] Authorization [phone]. Faxed orders to [place] with authorization.

## 2017-10-19 NOTE — Telephone Encounter (Signed)
Spoke with [place] [facility].  Since they were closed on Monday [facility] day) they are behind in [place] request.  They will ask for at least 24 hours to approval/denial.    Asked why we were told it was approved for a 30 day maintenance until notes were received, but pharmacy cant send it in.   She states to have the pharmacy call and they can override this blockage.  (ref# [id number])   Called John to inform, he will try again to call.  Explained issue to patient.  She is understandably upset but understand  that we [facility]) are working as hard as we can to get it approved. [place], Salome Spotted, CMA

## 2017-10-20 NOTE — Telephone Encounter (Signed)
Received message from [place].  Able to send the 30 maintenance Rx.   Will await approval or denial on PA for next 30 days. [name], Salome Spotted, CMA

## 2017-10-24 ENCOUNTER — Encounter: Payer: Medicaid Other | Admitting: Podiatry

## 2017-10-26 ENCOUNTER — Telehealth: Payer: Self-pay | Admitting: *Deleted

## 2017-10-26 DIAGNOSIS — M869 Osteomyelitis, unspecified: Secondary | ICD-10-CM

## 2017-10-26 NOTE — Telephone Encounter (Signed)
-----   Message from Cheryle Horsfall sent at 10/26/2017  8:53 AM EST ----- Regarding: RE: Prior Authorization Good Morning [place].  I see authorization for CPT [phone] (MRI joint).  However the order that was placed in Epic is for a non-joint (41423).  I do see a case pending on the [employer] portal for 73720 but the case says they are "waiting on additional information".  That case # is [id number].  If the Dr is truly wanting MRI non-joint could you please call [employer] @ [phone] options 3 & 1 & ask to speak with a nurse to cancel the approval for the joint MRI & try & get the pending non joint approved.    Thank you for your help. Dorian Pod  ----- Message ----- From: Andres Ege, RN Sent: 10/12/2017  10:49 AM To: Cheryle Horsfall Subject: RE: Prior Authorization                        Dorian Pod, Dr. Amalia Hailey now has a PEER TO PEER this afternoon for the original order. What can I do now that I've changed the order to 73720 (because I didn't know he was going to do a PEER TO PEER). Marcy Siren ----- Message ----- From: Cheryle Horsfall Sent: 10/11/2017  10:57 AM To: Andres Ege, RN, Lolita Rieger Subject: Prior Authorization                            Hi Delydia.  I left you a voice mail regarding this patient, wanted to be sure you received it.  Also I copied Marcy Siren in on this message in case you are out of the office.  Per [employer] authorization has been denied for two separate codes, however those are not the codes patient is having performed. That is more than likely the reason they were denied.  Could you please obtain prior authorization for CPT 73720 for [facility] 10-14-2017.  Dr is trying to rule out bone infection per the patient.  [facility] is trying to avoid canceling this appointment due to no authorization.    Thank you for your help. Dorian Pod

## 2017-10-26 NOTE — Telephone Encounter (Signed)
I asked [facility] if the 17471 MRI left ankle with and without contrast would cover the heel to check for ulcer and osteomyelitis. Dorian Pod checked with MRI tech and was told it would cover for those request, and would need an order for the 73723 that is currently approved by Medicaid.

## 2017-10-29 ENCOUNTER — Inpatient Hospital Stay (HOSPITAL_COMMUNITY): Payer: Medicaid Other

## 2017-10-29 ENCOUNTER — Encounter (HOSPITAL_COMMUNITY): Payer: Self-pay | Admitting: Emergency Medicine

## 2017-10-29 ENCOUNTER — Emergency Department (HOSPITAL_COMMUNITY): Payer: Medicaid Other

## 2017-10-29 ENCOUNTER — Other Ambulatory Visit: Payer: Self-pay

## 2017-10-29 ENCOUNTER — Observation Stay (HOSPITAL_COMMUNITY)
Admission: EM | Admit: 2017-10-29 | Discharge: 2017-10-30 | Disposition: A | Discharge status: Home / Self Care | Payer: Medicaid Other | Attending: Family Medicine | Admitting: Family Medicine

## 2017-10-29 DIAGNOSIS — M170 Bilateral primary osteoarthritis of knee: Secondary | ICD-10-CM | POA: Insufficient documentation

## 2017-10-29 DIAGNOSIS — E1165 Type 2 diabetes mellitus with hyperglycemia: Secondary | ICD-10-CM | POA: Insufficient documentation

## 2017-10-29 DIAGNOSIS — E1140 Type 2 diabetes mellitus with diabetic neuropathy, unspecified: Secondary | ICD-10-CM | POA: Insufficient documentation

## 2017-10-29 DIAGNOSIS — F329 Major depressive disorder, single episode, unspecified: Secondary | ICD-10-CM | POA: Diagnosis not present

## 2017-10-29 DIAGNOSIS — E10621 Type 1 diabetes mellitus with foot ulcer: Secondary | ICD-10-CM

## 2017-10-29 DIAGNOSIS — Z794 Long term (current) use of insulin: Secondary | ICD-10-CM | POA: Diagnosis not present

## 2017-10-29 DIAGNOSIS — L97429 Non-pressure chronic ulcer of left heel and midfoot with unspecified severity: Secondary | ICD-10-CM | POA: Diagnosis not present

## 2017-10-29 DIAGNOSIS — Z79899 Other long term (current) drug therapy: Secondary | ICD-10-CM | POA: Insufficient documentation

## 2017-10-29 DIAGNOSIS — J449 Chronic obstructive pulmonary disease, unspecified: Secondary | ICD-10-CM | POA: Diagnosis not present

## 2017-10-29 DIAGNOSIS — Z87891 Personal history of nicotine dependence: Secondary | ICD-10-CM | POA: Diagnosis not present

## 2017-10-29 DIAGNOSIS — E785 Hyperlipidemia, unspecified: Secondary | ICD-10-CM | POA: Insufficient documentation

## 2017-10-29 DIAGNOSIS — R739 Hyperglycemia, unspecified: Secondary | ICD-10-CM

## 2017-10-29 DIAGNOSIS — G4733 Obstructive sleep apnea (adult) (pediatric): Secondary | ICD-10-CM | POA: Insufficient documentation

## 2017-10-29 DIAGNOSIS — I10 Essential (primary) hypertension: Secondary | ICD-10-CM | POA: Insufficient documentation

## 2017-10-29 DIAGNOSIS — M722 Plantar fascial fibromatosis: Secondary | ICD-10-CM | POA: Insufficient documentation

## 2017-10-29 DIAGNOSIS — E11621 Type 2 diabetes mellitus with foot ulcer: Secondary | ICD-10-CM | POA: Diagnosis not present

## 2017-10-29 DIAGNOSIS — L97509 Non-pressure chronic ulcer of other part of unspecified foot with unspecified severity: Secondary | ICD-10-CM

## 2017-10-29 LAB — CBC WITH DIFFERENTIAL/PLATELET
BASOS ABS: 0 10*3/uL (ref 0.0–0.1)
Basophils Relative: 0 %
EOS PCT: 1 %
Eosinophils Absolute: 0.2 10*3/uL (ref 0.0–0.7)
HCT: 40.7 % (ref 36.0–46.0)
Hemoglobin: 13.7 g/dL (ref 12.0–15.0)
LYMPHS ABS: 5.9 10*3/uL — AB (ref 0.7–4.0)
LYMPHS PCT: 45 %
MCH: 30 pg (ref 26.0–34.0)
MCHC: 33.7 g/dL (ref 30.0–36.0)
MCV: 89.3 fL (ref 78.0–100.0)
MONO ABS: 0.7 10*3/uL (ref 0.1–1.0)
Monocytes Relative: 5 %
Neutro Abs: 6.4 10*3/uL (ref 1.7–7.7)
Neutrophils Relative %: 49 %
PLATELETS: 322 10*3/uL (ref 150–400)
RBC: 4.56 MIL/uL (ref 3.87–5.11)
RDW: 13 % (ref 11.5–15.5)
WBC: 13.2 10*3/uL — ABNORMAL HIGH (ref 4.0–10.5)

## 2017-10-29 LAB — BASIC METABOLIC PANEL
Anion gap: 10 (ref 5–15)
BUN: 22 mg/dL — ABNORMAL HIGH (ref 6–20)
CALCIUM: 9.2 mg/dL (ref 8.9–10.3)
CHLORIDE: 94 mmol/L — AB (ref 101–111)
CO2: 26 mmol/L (ref 22–32)
CREATININE: 0.76 mg/dL (ref 0.44–1.00)
GFR calc Af Amer: >60 mL/min (ref >60)
GLUCOSE: 546 mg/dL — AB (ref 65–99)
Potassium: 3.9 mmol/L (ref 3.5–5.1)
Sodium: 130 mmol/L — ABNORMAL LOW (ref 135–145)

## 2017-10-29 LAB — C-REACTIVE PROTEIN: CRP: 2.3 mg/dL — AB (ref <1.0)

## 2017-10-29 LAB — GLUCOSE, CAPILLARY
Glucose-Capillary: 358 mg/dL — ABNORMAL HIGH (ref 65–99)
Glucose-Capillary: 369 mg/dL — ABNORMAL HIGH (ref 65–99)
Glucose-Capillary: 345 mg/dL — ABNORMAL HIGH (ref 65–99)
Glucose-Capillary: 376 mg/dL — ABNORMAL HIGH (ref 65–99)
Glucose-Capillary: 328 mg/dL — ABNORMAL HIGH (ref 65–99)

## 2017-10-29 LAB — SEDIMENTATION RATE: SED RATE: 42 mm/h — AB (ref 0–22)

## 2017-10-29 LAB — PREGNANCY, URINE: Preg Test, Ur: NEGATIVE

## 2017-10-29 MED ORDER — KETOROLAC TROMETHAMINE 60 MG/2ML IM SOLN
60.0000 mg | Freq: Once | INTRAMUSCULAR | Status: AC
  Administered 2017-10-29: 60 mg via INTRAMUSCULAR
  Filled 2017-10-29: qty 2

## 2017-10-29 MED ORDER — LISINOPRIL 10 MG PO TABS
5.0000 mg | ORAL_TABLET | Freq: Every day | ORAL | Status: DC
  Administered 2017-10-29 – 2017-10-30 (×2): 5 mg via ORAL
  Filled 2017-10-29 (×3): qty 1

## 2017-10-29 MED ORDER — HYDROCODONE-ACETAMINOPHEN 10-325 MG PO TABS
1.0000 | ORAL_TABLET | Freq: Three times a day (TID) | ORAL | Status: DC | PRN
  Administered 2017-10-29 – 2017-10-30 (×5): 1 via ORAL
  Filled 2017-10-29 (×5): qty 1

## 2017-10-29 MED ORDER — ALBUTEROL SULFATE (2.5 MG/3ML) 0.083% IN NEBU: 3.0000 mL | INHALATION_SOLUTION | Freq: Four times a day (QID) | RESPIRATORY_TRACT | Status: DC | PRN

## 2017-10-29 MED ORDER — VANCOMYCIN HCL IN DEXTROSE 1-5 GM/200ML-% IV SOLN
1000.0000 mg | Freq: Once | INTRAVENOUS | Status: AC
  Administered 2017-10-29: 1000 mg via INTRAVENOUS
  Filled 2017-10-29: qty 200

## 2017-10-29 MED ORDER — PIPERACILLIN-TAZOBACTAM 3.375 G IVPB
3.3750 g | Freq: Three times a day (TID) | INTRAVENOUS | Status: DC
  Administered 2017-10-29 – 2017-10-30 (×3): 3.375 g via INTRAVENOUS
  Filled 2017-10-29 (×5): qty 50

## 2017-10-29 MED ORDER — GABAPENTIN 600 MG PO TABS
1200.0000 mg | ORAL_TABLET | Freq: Three times a day (TID) | ORAL | Status: DC
  Administered 2017-10-29 – 2017-10-30 (×4): 1200 mg via ORAL
  Filled 2017-10-29 (×4): qty 2

## 2017-10-29 MED ORDER — SODIUM CHLORIDE 0.9 % IV BOLUS (SEPSIS)
1000.0000 mL | Freq: Once | INTRAVENOUS | Status: AC
  Administered 2017-10-29: 1000 mL via INTRAVENOUS

## 2017-10-29 MED ORDER — INSULIN GLARGINE 100 UNIT/ML ~~LOC~~ SOLN
50.0000 [IU] | Freq: Every day | SUBCUTANEOUS | Status: DC
  Administered 2017-10-29: 50 [IU] via SUBCUTANEOUS
  Filled 2017-10-29 (×2): qty 0.5

## 2017-10-29 MED ORDER — ENOXAPARIN SODIUM 80 MG/0.8ML ~~LOC~~ SOLN
0.5000 mg/kg | SUBCUTANEOUS | Status: DC
  Administered 2017-10-29 – 2017-10-30 (×2): 70 mg via SUBCUTANEOUS
  Filled 2017-10-29 (×2): qty 0.8

## 2017-10-29 MED ORDER — INSULIN ASPART 100 UNIT/ML ~~LOC~~ SOLN
10.0000 [IU] | Freq: Once | SUBCUTANEOUS | Status: AC
  Administered 2017-10-29: 10 [IU] via INTRAVENOUS
  Filled 2017-10-29: qty 1

## 2017-10-29 MED ORDER — PANTOPRAZOLE SODIUM 40 MG PO TBEC
40.0000 mg | DELAYED_RELEASE_TABLET | Freq: Every day | ORAL | Status: DC
Start: 2017-10-29 — End: 2017-10-30
  Administered 2017-10-29 – 2017-10-30 (×2): 40 mg via ORAL
  Filled 2017-10-29 (×2): qty 1

## 2017-10-29 MED ORDER — PIPERACILLIN-TAZOBACTAM 3.375 G IVPB: 3.3750 g | Freq: Four times a day (QID) | INTRAVENOUS | Status: DC

## 2017-10-29 MED ORDER — VITAMIN D 1000 UNITS PO TABS
1000.0000 [IU] | ORAL_TABLET | Freq: Every day | ORAL | Status: DC
  Administered 2017-10-29 – 2017-10-30 (×2): 1000 [IU] via ORAL
  Filled 2017-10-29 (×2): qty 1

## 2017-10-29 MED ORDER — PIPERACILLIN-TAZOBACTAM 3.375 G IVPB
3.3750 g | Freq: Three times a day (TID) | INTRAVENOUS | Status: DC
  Administered 2017-10-29: 3.375 g via INTRAVENOUS
  Filled 2017-10-29 (×2): qty 50

## 2017-10-29 MED ORDER — ATORVASTATIN CALCIUM 20 MG PO TABS
20.0000 mg | ORAL_TABLET | Freq: Every day | ORAL | Status: DC
  Administered 2017-10-29 – 2017-10-30 (×2): 20 mg via ORAL
  Filled 2017-10-29 (×2): qty 1

## 2017-10-29 MED ORDER — GADOBENATE DIMEGLUMINE 529 MG/ML IV SOLN
20.0000 mL | Freq: Once | INTRAVENOUS | Status: AC
  Administered 2017-10-29: 20 mL via INTRAVENOUS

## 2017-10-29 MED ORDER — INSULIN ASPART 100 UNIT/ML ~~LOC~~ SOLN
0.0000 [IU] | Freq: Three times a day (TID) | SUBCUTANEOUS | Status: DC
  Administered 2017-10-29 (×2): 15 [IU] via SUBCUTANEOUS
  Administered 2017-10-29: 11 [IU] via SUBCUTANEOUS
  Administered 2017-10-30 (×2): 15 [IU] via SUBCUTANEOUS

## 2017-10-29 MED ORDER — ENOXAPARIN SODIUM 40 MG/0.4ML ~~LOC~~ SOLN: 40.0000 mg | SUBCUTANEOUS | Status: DC

## 2017-10-29 MED ORDER — MOMETASONE FURO-FORMOTEROL FUM 200-5 MCG/ACT IN AERO
2.0000 | INHALATION_SPRAY | Freq: Two times a day (BID) | RESPIRATORY_TRACT | Status: DC
  Administered 2017-10-29: 2 via RESPIRATORY_TRACT
  Filled 2017-10-29: qty 8.8

## 2017-10-29 MED ORDER — CARVEDILOL 3.125 MG PO TABS
3.1250 mg | ORAL_TABLET | Freq: Two times a day (BID) | ORAL | Status: DC
  Administered 2017-10-29 – 2017-10-30 (×3): 3.125 mg via ORAL
  Filled 2017-10-29 (×5): qty 1

## 2017-10-29 NOTE — ED Notes (Signed)
ED Provider at bedside. 

## 2017-10-29 NOTE — ED Triage Notes (Addendum)
Pt presents with c/o worsening infection to Left foot. Pt states she has been on 2 rounds of antibiotics for same, pt states she is now finished with antibiotics and tonight she noted a foul odor. Pt dressed wound and was advised by MD to come to ED for IV antibiotics. Pt states she is due to have MRI this week to see if infection has affected bone.  Pt states Gabapentin and hydrocodone are not helping pain.

## 2017-10-29 NOTE — H&P (Signed)
[facility] Admission History and Physical Service Pager: [phone]  Patient name: [name] Medical record number: [id number] Date of birth: 23-Mar-1976 Age: 41 y.o. Gender: female  Primary Care Provider: [name], MD Consultants: None Code Status: FULL  Chief Complaint: L foot ulcer  Assessment and Plan: [name] is a 41 y.o. female presenting with L foot ulcer. PMH is significant for poorly controlled Type 2 DM, COPD, HLD, HTN, bipolar disorder, OSA, drug-seeking behavior.   Left Heel Diabetic Foot Ulcer: Patient states she first developed a blister on her left heel after cutting grass in shoes without wearing any socks several weeks ago. She states a blister formed, scabbed over, and she has received two courses of antibiotics that have not improved the sore. Today around midnight she noticed foul-smelling drainage from the ulcer. Foot Xray was negative for osteomyelitis. MRI was scheduled for 11/29. Most likely etiology is a foot ulcer due to complications from uncontrolled diabetes. Elevated WBC to 13.2 but afebrile. Patient with poorly-controlled diabetes with known neuropathy which likely contributed to formation of wound and poor healing. Followed by Dr. Amalia Hall (podiatry).  - Admit to [place], attending Dr. McDiarmid - Day 1 of Vancomycin and Zosyn - MRI Left foot - Wound care consult - BMP, CBC in am - Wound culture - Continue home Norco q8 PRN  Type 2 DM: On On 100U Tresiba qd, 25U Novolog with dinner, Victoza 1.71m qd, and gabapentin 12061mTID for neuropathy. Last A1C 11.6 on 08/18/17. CBG 546 on presentation, though patient says she ate a cake immediately prior to ED arrival.  - Lantus 50U and mSSI Novolog for meal coverage - Continue Gabapentin 120015mID  COPD: On Symbicort and albuterol PRN. Patient has a non-productive cough she attributes to allergies/changing weather. No difficulty breathing or SOB at this time. O2 sats are  wnl. - Continue Symbicort - Continue Albuterol prn  Knee OA: On chronic Norco 10-325m72m for pain relief during exercise. Avoid Nubain as she states she feels like "things are crawling all over me". - Continue home NorcAustinle in hospital - Tylenol PRN  HTN: On Coreg and lisinopril at home. Hypertensive on admission to 157/93, though possibly elevated secondary to pain. - Continue Coreg  HLD:  - Continue home atorvastatin  Bipolar disorder/depression: Not currently medically treated.  - Continue to monitor  FEN/GI: none Prophylaxis: Lovenox  Disposition: admit to FPTS  History of Present Illness:  Belinda Newtona 41 y41. female presenting with L foot ulcer.   Patient presents with worsening pain from chronic L foot ulcer for past 3d. Has been taking home oxycodone and ibuprofen with no improvement in pain. Reports she has completed two rounds of antibiotics, but wound persists (finished most recent course on 11/19). She is followed by Dr. EvanAmalia Haileydiatry) for this issue. Says that she has been following his instructions regarding cleaning the wound with betadine and dressing changes. She noticed a new foul odor from the wound tonight when removing dressings to take a shower. She subsequently called Dr. EvanAmalia Haileyo recommeded going to the ED for further evaluation. Reports bloody drainage of the wound and pain with walking. Lesion first appeared after she cut the grass while wearing shoes with no socks. Denies fevers, chills. Has poorly-controlled DM and [facility]. Also on exercise regimen per Dr. McInArdelia Hall weight loss.   Review Of Systems: Per HPI with the following additions:  Review of Systems  Constitutional: Negative for chills and fever.  HENT: Negative for congestion.   Respiratory: Positive for cough. Negative for sputum production and shortness of breath.   Cardiovascular: Negative for chest pain.   Gastrointestinal: Negative for abdominal pain, nausea and vomiting.    Patient Active Problem List   Diagnosis Date Noted  . Ulcer of skin ([place]) 10/14/2017  . Decreased pedal pulses 06/10/2017  . Adjustment disorder with mixed anxiety and depressed mood 11/04/2016  . History of medication noncompliance 11/04/2016  . Unilateral primary osteoarthritis, right knee 10/22/2016  . Diabetic neuropathy ([place]) 07/14/2016  . Syncope 02/25/2016  . De Quervain's tenosynovitis, bilateral 11/01/2015  . Vitamin D deficiency 09/05/2015  . Recurrent candidiasis of vagina 09/05/2015  . Varicose veins of leg with complications 49/17/9150  . Neuropathy, intercostal nerve 12/17/2014  . Rectal itching 11/17/2014  . Restless leg syndrome 10/17/2014  . Chronic sinusitis 07/18/2014  . Headache 07/15/2014  . Vision, loss, sudden 07/12/2014  . Urge incontinence 10/15/2013  . Encounter for chronic pain management 06/30/2013  . Low back pain 01/31/2013  . HLD (hyperlipidemia) 11/19/2012  . Chest pain 06/27/2012  . Right carpal tunnel syndrome 09/01/2011  . Bilateral knee pain 09/01/2011  . Insulin dependent diabetes mellitus ([place]) 05/22/2008  . Morbid obesity ([place]) 05/22/2008  . 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 Depression 05/22/2008  . Obstructive sleep apnea 05/22/2008  . Essential hypertension 05/22/2008  . Asthma 05/22/2008  . GERD 05/22/2008    Past Medical History: Past Medical History:  Diagnosis Date  . Alveolar hypoventilation   . Anemia    not on iron pill  . Arthritis   . Asthma   . Bipolar 2 disorder ([place])   . Carpal tunnel syndrome on right    recurrent  . Cellulitis 08/2010-08/2011  . Chronic pain   . COPD (chronic obstructive pulmonary disease) ([name])    Symbicort daily and Proventil as needed  . Costochondritis   . Depression   . Diabetes mellitus 2000   Type 2, Uncontrolled.Takes Lantus daily.Fasting blood sugar runs 150  . Dizziness    occasionally  .  Drug-seeking behavior   . GERD (gastroesophageal reflux disease)    takes Pantoprazole and Zantac daily  . Headache    migraine-last one about a yr ago.Topamax daily  . HLD (hyperlipidemia)    takes Atorvastatin daily  . Hypertension    takes Lisinopril and Coreg daily  . Morbid obesity ([place])   . Muscle spasm    takes Flexeril as needed  . Nocturia   . Obstructive sleep apnea   . Peripheral neuropathy    takes Gabapentin daily  . Pneumonia    "walking" several yrs ago and as a baby  . Rectal fissure   . Restless leg   . Urinary frequency   . Varicose veins    Right medial thigh and Left leg     Past Surgical History: Past Surgical History:  Procedure Laterality Date  . CARPAL TUNNEL RELEASE    . CESAREAN SECTION    . KNEE ARTHROSCOPY Right 07/17/2010  . LEFT HEART CATHETERIZATION WITH CORONARY ANGIOGRAM N/A 07/27/2012   Procedure: LEFT HEART CATHETERIZATION WITH CORONARY ANGIOGRAM;  Surgeon: Sherren Mocha, MD;  Location: [facility] CATH LAB;  Service: Cardiovascular;  Laterality: N/A;  . left knee surgery     screws she thinks  . MASS EXCISION N/A 06/29/2013   Procedure:  WIDE LOCAL EXCISION OF POSTERIOR NECK ABSCESS;  Surgeon: Ralene Ok, MD;  Location:  [facility] OR;  Service: General;  Laterality: N/A;    Social History: Social History   Tobacco Use  . Smoking status: Former Smoker    Packs/day: 0.30    Years: 0.30    Pack years: 0.09    Types: Cigarettes    Last attempt to quit: 12/06/1993    Years since quitting: 23.9  . Smokeless tobacco: Never Used  Substance Use Topics  . Alcohol use: No    Alcohol/week: 0.0 oz  . Drug use: Yes    Comment: OD attempts on home meds     Additional social history: Lives at home with daughter and husband.   Please also refer to relevant sections of EMR.  Family History: Family History  Problem Relation Age of Onset  . Diabetes Mother   . Hyperlipidemia Mother   . Depression Mother   . Varicose Veins Mother   . Heart attack  Paternal Uncle   . Heart disease Paternal Grandmother   . Heart attack Paternal Grandmother   . Heart attack Paternal Grandfather   . Heart disease Paternal Grandfather   . Heart attack Father   . Cancer Maternal Grandmother        COLON  . Hypertension Maternal Grandmother   . Hyperlipidemia Maternal Grandmother   . Diabetes Maternal Grandmother   . Other Maternal Grandfather        GUN SHOT    Allergies and Medications: Allergies  Allergen Reactions  . Kiwi Extract Shortness Of Breath and Swelling  . Nubain [Nalbuphine Hcl] Other (See Comments)    "FEELS LIKE SOMETHING CRAWLING ON ME"   No current facility-administered medications on file prior to encounter.    Current Outpatient Medications on File Prior to Encounter  Medication Sig Dispense Refill  . ACCU-CHEK GUIDE test strip USE T0 CHECK BLOOD SUGAR 3 TIMES DAILY 100 each 5  . ACCU-CHEK SOFTCLIX LANCETS lancets Use as instructed to check blood sugar 3 times per day 100 each 12  . albuterol (PROVENTIL HFA;VENTOLIN HFA) 108 (90 Base) MCG/ACT inhaler Inhale 1-2 puffs into the lungs every 6 (six) hours as needed for wheezing or shortness of breath. 18 g 5  . amoxicillin-clavulanate (AUGMENTIN) 875-125 MG tablet Take 1 tablet by mouth 2 (two) times daily. 28 tablet 1  . atorvastatin (LIPITOR) 20 MG tablet Take 1 tablet (20 mg total) by mouth daily. 30 tablet 5  . Blood Glucose Monitoring Suppl (ACCU-CHEK NANO SMARTVIEW) w/Device KIT Use to check blood sugar 3 times daily 1 kit 0  . carvedilol (COREG) 3.125 MG tablet Take 1 tablet (3.125 mg total) by mouth 2 (two) times daily with a meal. 60 tablet 3  . Cholecalciferol 1000 units tablet Take 1 tablet (1,000 Units total) by mouth daily. 90 tablet 0  . cyclobenzaprine (FLEXERIL) 5 MG tablet TAKE ONE (1) TABLET BY MOUTH EVERY DAY AS NEEDED FOR MUSCLE SPASMS 30 tablet 1  . gabapentin (NEURONTIN) 600 MG tablet Take 2 tablets (1,200 mg total) by mouth 3 (three) times daily. 180 tablet 5   . HYDROcodone-acetaminophen (NORCO) 10-325 MG tablet Take 1 tablet by mouth every 8 (eight) hours as needed for moderate pain. 90 tablet 0  . insulin aspart (NOVOLOG FLEXPEN) 100 UNIT/ML FlexPen INJECT 25 UNITS INTO THE SKIN DAILY WITHSUPPER 15 mL 2  . Insulin Degludec (TRESIBA FLEXTOUCH) 200 UNIT/ML SOPN Inject 100 Units into the skin daily with breakfast. 18 mL 2  . Insulin Pen Needle (NOVOFINE) 32G X 6 MM MISC 1 Units by Does  not apply route 3 (three) times daily. 100 each 11  . Insulin Pen Needle (PEN NEEDLES) 31G X 6 MM MISC Use to inject insulin daily 100 each 3  . lisinopril (PRINIVIL,ZESTRIL) 5 MG tablet Take 1 tablet (5 mg total) by mouth daily. 30 tablet 1  . naloxone (NARCAN) nasal spray 4 mg/0.1 mL 1 spray into the nose as needed for overdose (Patient taking differently: Place 1 spray into the nose once as needed (overdose). 1 spray into the nose as needed for overdose) 2 kit 0  . pantoprazole (PROTONIX) 40 MG tablet Take 1 tablet (40 mg total) by mouth daily. 30 tablet 1  . SYMBICORT 160-4.5 MCG/ACT inhaler INHALE 2 PUFFS INTO THE LUNGS 2 TIMES DAILY 10.2 g 4  . VICTOZA 18 MG/3ML SOPN INJECT 0.3 MILLILITERS (1.8MG TOTAL) INTO THE SKIN DAILY 9 mL 2    Objective: BP (!) 157/93 (BP Location: Right Arm)   Pulse 98   Temp 98.6 F (37 C) (Oral)   Ht '5\' 2"'  (1.575 m)   Wt 294 lb (133.4 kg)   LMP 10/10/2017   SpO2 99%   BMI 53.77 kg/m  Exam: Gen: Alert and Oriented x 3, NAD HEENT: Normocephalic, atraumatic, PERRLA, EOMI, non-erythematous pharyngeal mucosa, no exudates Neck: trachea midline, no thyroidmegaly, no LAD CV: RRR, no murmurs, normal S1, S2 split, faint pulses dorsalis pedis bilaterally Resp: CTAB, no wheezing, rales, or rhonchi, comfortable work of breathing Abd: non-distended, non-tender, soft, +bs in all four quadrants, no hepatomegaly MSK: FROM in all four extremities Ext: no clubbing, cyanosis, or edema. 1 cm round ulcer on the medial aspect of left heel with  surrounding erythema and TTP around the open sore. Not stagable. Purulent drainage. Neuro: CN II-XII intact, no focal or gross deficits Skin: warm, dry, intact, no rashes Psych: appropriate behavior, mood   Labs and Imaging: CBC BMET  Recent Labs  Lab 10/29/17 0245  WBC 13.2*  HGB 13.7  HCT 40.7  PLT 322   Recent Labs  Lab 10/29/17 0245  NA 130*  K 3.9  CL 94*  CO2 26  BUN 22*  CREATININE 0.76  GLUCOSE 546*  CALCIUM 9.2     Dg Foot Complete Left  Result Date: 10/29/2017 CLINICAL DATA:  Left foot ulcer and infection. Two rounds of antibiotics. EXAM: LEFT FOOT - COMPLETE 3+ VIEW COMPARISON:  09/26/2017 FINDINGS: Soft tissue defect over the posterior calcaneus consistent with history of ulceration. No underlying bone changes to suggest osteomyelitis. No evidence of acute fracture or dislocation in the left foot. No focal bone lesion or bone destruction. Bone cortex appears intact. Vascular calcifications in the soft tissues. Diffuse soft tissue calcification in the lower leg consistent with dystrophic calcification. IMPRESSION: Ulceration over the left heel. No evidence of osteomyelitis or other acute bony abnormality. Electronically Signed   By: Lucienne Capers M.D.   On: 10/29/2017 03:16     Nuala Alpha, DO 10/29/2017, 4:08 AM PGY-1, [place] [facility] pager: [phone], text pages welcome  UPPER LEVEL ADDENDUM  I have read the above note and made revisions highlighted in orange.  Adin Hector, MD, MPH PGY-3 [facility] Pager [phone]

## 2017-10-29 NOTE — ED Notes (Signed)
[place], MD verbally notified of critical glucose of 546.

## 2017-10-29 NOTE — ED Notes (Signed)
Pt medicated per MD orders, awaiting admitting team.  Pt and daughter provided beverages

## 2017-10-29 NOTE — Progress Notes (Addendum)
New Admission Note: pt transferred from [facility] to room 5M06  Arrival Method: via stretcher Mental Orientation: alert and oriented x 4 Telemetry: N/A Assessment: Completed Skin: Left foot diabetic ulcer 1 x 1 cm IV: R AC Pain: Denies Tubes: None Safety Measures: Safety Fall Prevention Plan has been discussed  Admission: to be completed [place] Orientation: Patient has been orientated to the room, unit and staff.  Family: pt's 29 year old daughter at the bedside  Orders to be reviewed and implemented. Will continue to monitor the patient. Call light has been placed within reach and bed alarm has been activated.   Mady Gemma, BSN, RN-BC Phone: [phone]

## 2017-10-29 NOTE — ED Notes (Signed)
Patient transported to X-ray 

## 2017-10-29 NOTE — ED Provider Notes (Signed)
[facility] EMERGENCY DEPARTMENT Provider Note   CSN: [id number] Arrival date & time: 10/29/17  0044     History   Chief Complaint Chief Complaint  Patient presents with  . Skin Ulcer    HPI [name] is a 41 y.o. female.  Patient is a 41 year old female with history of bipolar, COPD, obesity, and diabetes.  She presents today for a sore on the heel of her left foot.  This is been present for several months and started after she mowed the grass and developed a blister.  She has been on 2 different antibiotics that have been prescribed by the podiatrist.  She completed what sounds like Augmentin several days ago.  She is now having increased foul-smelling drainage.  She was told by the podiatrist to come here for evaluation.   The history is provided by the patient.    Past Medical History:  Diagnosis Date  . Alveolar hypoventilation   . Anemia    not on iron pill  . Arthritis   . Asthma   . Bipolar 2 disorder ([place])   . Carpal tunnel syndrome on right    recurrent  . Cellulitis 08/2010-08/2011  . Chronic pain   . COPD (chronic obstructive pulmonary disease) ([name])    Symbicort daily and Proventil as needed  . Costochondritis   . Depression   . Diabetes mellitus 2000   Type 2, Uncontrolled.Takes Lantus daily.Fasting blood sugar runs 150  . Dizziness    occasionally  . Drug-seeking behavior   . GERD (gastroesophageal reflux disease)    takes Pantoprazole and Zantac daily  . Headache    migraine-last one about a yr ago.Topamax daily  . HLD (hyperlipidemia)    takes Atorvastatin daily  . Hypertension    takes Lisinopril and Coreg daily  . Morbid obesity ([place])   . Muscle spasm    takes Flexeril as needed  . Nocturia   . Obstructive sleep apnea   . Peripheral neuropathy    takes Gabapentin daily  . Pneumonia    "walking" several yrs ago and as a baby  . Rectal fissure   . Restless leg   . Urinary frequency   . Varicose veins    Right  medial thigh and Left leg     Patient Active Problem List   Diagnosis Date Noted  . Ulcer of skin ([place]) 10/14/2017  . Decreased pedal pulses 06/10/2017  . Adjustment disorder with mixed anxiety and depressed mood 11/04/2016  . History of medication noncompliance 11/04/2016  . Unilateral primary osteoarthritis, right knee 10/22/2016  . Diabetic neuropathy ([place]) 07/14/2016  . Syncope 02/25/2016  . De Quervain's tenosynovitis, bilateral 11/01/2015  . Vitamin D deficiency 09/05/2015  . Recurrent candidiasis of vagina 09/05/2015  . Varicose veins of leg with complications [id number]/93/8182  . Neuropathy, intercostal nerve 12/17/2014  . Rectal itching 11/17/2014  . Restless leg syndrome 10/17/2014  . Chronic sinusitis 07/18/2014  . Headache 07/15/2014  . Vision, loss, sudden 07/12/2014  . Urge incontinence 10/15/2013  . Encounter for chronic pain management 06/30/2013  . Low back pain 01/31/2013  . HLD (hyperlipidemia) 11/19/2012  . Chest pain 06/27/2012  . Right carpal tunnel syndrome 09/01/2011  . Bilateral knee pain 09/01/2011  . Insulin dependent diabetes mellitus ([place]) 05/22/2008  . Morbid obesity ([place]) 05/22/2008  . 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 Depression 05/22/2008  . Obstructive sleep apnea 05/22/2008  . Essential hypertension 05/22/2008  . Asthma 05/22/2008  . GERD 05/22/2008  Past Surgical History:  Procedure Laterality Date  . CARPAL TUNNEL RELEASE    . CESAREAN SECTION    . KNEE ARTHROSCOPY Right 07/17/2010  . LEFT HEART CATHETERIZATION WITH CORONARY ANGIOGRAM N/A 07/27/2012   Procedure: LEFT HEART CATHETERIZATION WITH CORONARY ANGIOGRAM;  Surgeon: Sherren Mocha, MD;  Location: [facility] CATH LAB;  Service: Cardiovascular;  Laterality: N/A;  . left knee surgery     screws she thinks  . MASS EXCISION N/A 06/29/2013   Procedure:  WIDE LOCAL EXCISION OF POSTERIOR NECK ABSCESS;  Surgeon: Belinda Ok, MD;  Location: [facility];  Service: General;  Laterality: N/A;     OB History    Gravida Para Term Preterm AB Living   '2 1     1 1   ' SAB TAB Ectopic Multiple Live Births   1               Home Medications    Prior to Admission medications   Medication Sig Start Date End Date Taking? Authorizing Provider  ACCU-CHEK GUIDE test strip USE T0 CHECK BLOOD SUGAR 3 TIMES DAILY 07/26/17   [name], MD  ACCU-CHEK SOFTCLIX LANCETS lancets Use as instructed to check blood sugar 3 times per day 04/07/17   [name], MD  albuterol (PROVENTIL HFA;VENTOLIN HFA) 108 (90 Base) MCG/ACT inhaler Inhale 1-2 puffs into the lungs every 6 (six) hours as needed for wheezing or shortness of breath. 03/23/17   [name], MD  amoxicillin-clavulanate (AUGMENTIN) 875-125 MG tablet Take 1 tablet by mouth 2 (two) times daily. 09/26/17   Edrick Kins, DPM  atorvastatin (LIPITOR) 20 MG tablet Take 1 tablet (20 mg total) by mouth daily. 03/23/17   [name], MD  Blood Glucose Monitoring Suppl (ACCU-CHEK NANO SMARTVIEW) w/Device KIT Use to check blood sugar 3 times daily 04/07/17   [name], MD  carvedilol (COREG) 3.125 MG tablet Take 1 tablet (3.125 mg total) by mouth 2 (two) times daily with a meal. 03/23/17   [name], MD  Cholecalciferol 1000 units tablet Take 1 tablet (1,000 Units total) by mouth daily. 04/05/17   [name], MD  cyclobenzaprine (FLEXERIL) 5 MG tablet TAKE ONE (1) TABLET BY MOUTH EVERY DAY AS NEEDED FOR MUSCLE SPASMS 10/06/17   [name], MD  gabapentin (NEURONTIN) 600 MG tablet Take 2 tablets (1,200 mg total) by mouth 3 (three) times daily. 06/02/17   [name], MD  HYDROcodone-acetaminophen (NORCO) 10-325 MG tablet Take 1 tablet by mouth every 8 (eight) hours as needed for moderate pain. 08/18/17   [name], MD  insulin aspart (NOVOLOG FLEXPEN) 100 UNIT/ML FlexPen INJECT 25 UNITS INTO THE SKIN DAILY WITHSUPPER 09/28/17   [name], MD  Insulin Degludec  (TRESIBA FLEXTOUCH) 200 UNIT/ML SOPN Inject 100 Units into the skin daily with breakfast. 09/28/17   [name], MD  Insulin Pen Needle (NOVOFINE) 32G X 6 MM MISC 1 Units by Does not apply route 3 (three) times daily. 07/28/17   [name], MD  Insulin Pen Needle (PEN NEEDLES) 31G X 6 MM MISC Use to inject insulin daily 03/23/17   [name], MD  lisinopril (PRINIVIL,ZESTRIL) 5 MG tablet Take 1 tablet (5 mg total) by mouth daily. 03/23/17   [name], MD  naloxone [facility]) nasal spray 4 mg/0.1 mL 1 spray into the nose as needed for overdose Patient taking differently: Place 1 spray into the nose once as needed (overdose). 1  spray into the nose as needed for overdose 03/23/17   [name], MD  pantoprazole (PROTONIX) 40 MG tablet Take 1 tablet (40 mg total) by mouth daily. 09/01/17   Lind Covert, MD  SYMBICORT 160-4.5 MCG/ACT inhaler INHALE 2 PUFFS INTO THE LUNGS 2 TIMES DAILY 08/23/17   [name], MD  VICTOZA 18 MG/3ML SOPN INJECT 0.3 MILLILITERS (1.8MG TOTAL) INTO THE SKIN DAILY 09/28/17   [name], MD    Family History Family History  Problem Relation Age of Onset  . Diabetes Mother   . Hyperlipidemia Mother   . Depression Mother   . Varicose Veins Mother   . Heart attack Paternal Uncle   . Heart disease Paternal Grandmother   . Heart attack Paternal Grandmother   . Heart attack Paternal Grandfather   . Heart disease Paternal Grandfather   . Heart attack Father   . Cancer Maternal Grandmother        COLON  . Hypertension Maternal Grandmother   . Hyperlipidemia Maternal Grandmother   . Diabetes Maternal Grandmother   . Other Maternal Grandfather        GUN SHOT    Social History Social History   Tobacco Use  . Smoking status: Former Smoker    Packs/day: 0.30    Years: 0.30    Pack years: 0.09    Types: Cigarettes    Last attempt to quit: 12/06/1993    Years since quitting: 23.9  . Smokeless tobacco:  Never Used  Substance Use Topics  . Alcohol use: No    Alcohol/week: 0.0 oz  . Drug use: Yes    Comment: OD attempts on home meds       Allergies   Kiwi extract and Nubain [nalbuphine hcl]   Review of Systems Review of Systems  All other systems reviewed and are negative.    Physical Exam Updated Vital Signs BP (!) 157/93 (BP Location: Right Arm)   Pulse 98   Temp 98.6 F (37 C) (Oral)   Ht '5\' 2"'  (1.575 m)   Wt 133.4 kg (294 lb)   LMP 10/10/2017   SpO2 99%   BMI 53.77 kg/m   Physical Exam  Constitutional: She is oriented to person, place, and time. She appears well-developed and well-nourished. No distress.  HENT:  Head: Normocephalic and atraumatic.  Neck: Normal range of motion. Neck supple.  Cardiovascular: Normal rate and regular rhythm. Exam reveals no gallop and no friction rub.  No murmur heard. Pulmonary/Chest: Effort normal and breath sounds normal. No respiratory distress. She has no wheezes.  Abdominal: Soft. Bowel sounds are normal. She exhibits no distension. There is no tenderness.  Musculoskeletal: Normal range of motion.  There is a 1 cm round ulcer to the medial aspect of the left heel on the plantar surface.  There is some surrounding erythema and tenderness.  There is a slight amount of purulent drainage.  Neurological: She is alert and oriented to person, place, and time.  Skin: Skin is warm and dry. She is not diaphoretic.  Nursing note and vitals reviewed.    ED Treatments / Results  Labs (all labs ordered are listed, but only abnormal results are displayed) Labs Reviewed  BASIC METABOLIC PANEL  CBC WITH DIFFERENTIAL/PLATELET    EKG  EKG Interpretation None       Radiology No results found.  Procedures Procedures (including critical care time)  Medications Ordered in ED Medications - No data to display   Initial Impression /  Assessment and Plan / ED Course  I have reviewed the triage vital signs and the nursing  notes.  Pertinent labs & imaging results that were available during my care of the patient were reviewed by me and considered in my medical decision making (see chart for details).  Patient with chronic, nonhealing foot ulcer in the setting of type 1 diabetes.  She just completed 2 courses of outpatient antibiotics, however her foot continues with drainage and pain.  X-rays are negative today for osteomyelitis.  Laboratory studies do show hyperglycemia with no ketoacidosis.  Her blood sugar is well over 500.  She was given normal saline and insulin along with vancomycin.  I feel as though she will require wound care consultation and repeat IV antibiotics.  I have spoken with the family medicine service who agrees to admit.  Final Clinical Impressions(s) / ED Diagnoses   Final diagnoses:  None    ED Discharge Orders    None       Veryl Speak, MD 10/29/17 [phone]

## 2017-10-30 LAB — COMPREHENSIVE METABOLIC PANEL
ALBUMIN: 2.5 g/dL — AB (ref 3.5–5.0)
ALK PHOS: 102 U/L (ref 38–126)
ALT: 14 U/L (ref 14–54)
AST: 11 U/L — ABNORMAL LOW (ref 15–41)
Anion gap: 9 (ref 5–15)
BUN: 27 mg/dL — ABNORMAL HIGH (ref 6–20)
CALCIUM: 8.6 mg/dL — AB (ref 8.9–10.3)
CO2: 25 mmol/L (ref 22–32)
CREATININE: 0.94 mg/dL (ref 0.44–1.00)
Chloride: 97 mmol/L — ABNORMAL LOW (ref 101–111)
GFR calc Af Amer: >60 mL/min (ref >60)
GFR calc non Af Amer: >60 mL/min (ref >60)
GLUCOSE: 401 mg/dL — AB (ref 65–99)
Potassium: 4.2 mmol/L (ref 3.5–5.1)
SODIUM: 131 mmol/L — AB (ref 135–145)
TOTAL PROTEIN: 6.2 g/dL — AB (ref 6.5–8.1)
Total Bilirubin: 0.4 mg/dL (ref 0.3–1.2)

## 2017-10-30 LAB — HEMOGLOBIN A1C
HEMOGLOBIN A1C: 13.7 % — AB (ref 4.8–5.6)
MEAN PLASMA GLUCOSE: 346 mg/dL

## 2017-10-30 LAB — GLUCOSE, CAPILLARY
GLUCOSE-CAPILLARY: 455 mg/dL — AB (ref 65–99)
GLUCOSE-CAPILLARY: 340 mg/dL — AB (ref 65–99)
Glucose-Capillary: 393 mg/dL — ABNORMAL HIGH (ref 65–99)
Glucose-Capillary: 284 mg/dL — ABNORMAL HIGH (ref 65–99)

## 2017-10-30 LAB — HIV ANTIBODY (ROUTINE TESTING W REFLEX): HIV SCREEN 4TH GENERATION: NONREACTIVE

## 2017-10-30 MED ORDER — LEVOFLOXACIN 500 MG PO TABS: 500.0000 mg | ORAL_TABLET | Freq: Every day | ORAL | 0 refills | Status: DC

## 2017-10-30 NOTE — Discharge Summary (Signed)
[facility] Discharge Summary  Patient name: [name] Medical record number: [id number] Date of birth: Apr 18, 1976 Age: 41 y.o. Gender: female Date of Admission: 10/29/2017  Date of Discharge: 10/30/2017 Admitting Physician: Blane Ohara McDiarmid, MD  Primary Care Provider: [name], MD Consultants: None  Indication for Hospitalization:   Diabetic Foot Ulcer with concern for osteomyelitis  Discharge Diagnoses/Problem List:   Cellulitis Diabetic Foot Ulcer T2DM  Disposition: home  Discharge Condition: stable  Discharge Exam:   Gen: Alert and Oriented x 3, NAD HEENT: Normocephalic, atraumatic, PERRLA, EOMI, CV: RRR, no murmurs, normal S1, S2 split, faint dorsalis pedis bilaterally Resp: CTAB, no wheezing, rales, or rhonchi, comfortable work of breathing Abd: non-distended, non-tender, soft, +bs in all four quadrants, no hepatomegaly MSK: FROM in all four extremities Ext: no clubbing, cyanosis, or edema; left heel is wrapped and dressed. Skin: warm, dry, intact, no rashes   Brief Hospital Course:   [name] is a 41 y/o female with a PMH of T2DM with diabetic neuropathy, COPD, HTN, HLD, OA of her knees, depression, and bipolar disorder. She presented to the ED after she noticed that a previous ulcer on the medial side of her left heel was producing foul-smelling drainage. She has had the ulcer for several weeks after developing a blister while cutting grass without wearing socks. She was treated twice with Augmentin outpatient but it did not resolve the purulent drainage. She was started on Vanc and Zosyn empirically before blood cultures could be drawn. Her foot X-ray and MRI were not concerning for osteomyelitis and patient remained stable and remained afebrile with no systemic signs of infection. She was transitioned to PO Levaquin and we will follow up with her wound cultures. She was discharged home with 12 days of Levaquin to complete a 14  day course of antibiotics.  Issues for Follow Up:  1. Patient has poorly controlled diabetes and would benefit from adjustment of medication or conversion to insulin. It should be given consideration that she is non-compliant with her medication. 2. Follow up with podiatry for diabetic foot ulcer. 3. She is on Norco for Osteoarthritis for her knee but she often complains of pain that is not well controlled. I think she would benefit from being weaned off opioids since its not helping with her pain.  Significant Procedures:   MRI Left Foot: negative for osteomyelitis  Significant Labs and Imaging:  Recent Labs  Lab 10/29/17 0245  WBC 13.2*  HGB 13.7  HCT 40.7  PLT 322   Recent Labs  Lab 10/29/17 0245 10/30/17 0524  NA 130* 131*  K 3.9 4.2  CL 94* 97*  CO2 26 25  GLUCOSE 546* 401*  BUN 22* 27*  CREATININE 0.76 0.94  CALCIUM 9.2 8.6*  ALKPHOS  --  102  AST  --  11*  ALT  --  14  ALBUMIN  --  2.5*   Dg Foot Complete Left  Result Date: 10/29/2017 CLINICAL DATA: Left foot ulcer and infection. Two rounds of antibiotics. EXAM: LEFT FOOT - COMPLETE 3+ VIEW COMPARISON: 09/26/2017 FINDINGS: Soft tissue defect over the posterior calcaneus consistent with history of ulceration. No underlying bone changes to suggest osteomyelitis. No evidence of acute fracture or dislocation in the left foot. No focal bone lesion or bone destruction. Bone cortex appears intact. Vascular calcifications in the soft tissues. Diffuse soft tissue calcification in the lower leg consistent with dystrophic calcification. IMPRESSION: Ulceration over the left heel. No evidence of osteomyelitis or  other acute bony abnormality. Electronically Signed By: Lucienne Capers M.D. On: 10/29/2017 03:16   11/24 - MRI Left Foot: IMPRESSION Skin ulceration on the heel with mild appearing underlying cellulitis. Negative for abscess, osteomyelitis or septic joint.  Results/Tests Pending at Time of Discharge:    Wound Cultures pending  Discharge Medications:  Allergies as of 10/30/2017      Reactions   Kiwi Extract Shortness Of Breath, Swelling   Nubain [nalbuphine Hcl] Other (See Comments)   "FEELS LIKE SOMETHING CRAWLING ON ME"      Medication List    STOP taking these medications   insulin aspart 100 UNIT/ML FlexPen Commonly known as:  NOVOLOG FLEXPEN     TAKE these medications   ACCU-CHEK GUIDE test strip Generic drug:  glucose blood USE T0 CHECK BLOOD SUGAR 3 TIMES DAILY   ACCU-CHEK NANO SMARTVIEW w/Device Kit Use to check blood sugar 3 times daily   ACCU-CHEK SOFTCLIX LANCETS lancets Use as instructed to check blood sugar 3 times per day   albuterol 108 (90 Base) MCG/ACT inhaler Commonly known as:  PROVENTIL HFA;VENTOLIN HFA Inhale 1-2 puffs into the lungs every 6 (six) hours as needed for wheezing or shortness of breath.   atorvastatin 20 MG tablet Commonly known as:  LIPITOR Take 1 tablet (20 mg total) by mouth daily.   carvedilol 3.125 MG tablet Commonly known as:  COREG Take 1 tablet (3.125 mg total) by mouth 2 (two) times daily with a meal.   Cholecalciferol 1000 units tablet Take 1 tablet (1,000 Units total) by mouth daily.   cyclobenzaprine 5 MG tablet Commonly known as:  FLEXERIL TAKE ONE (1) TABLET BY MOUTH EVERY DAY AS NEEDED FOR MUSCLE SPASMS   gabapentin 600 MG tablet Commonly known as:  NEURONTIN Take 2 tablets (1,200 mg total) by mouth 3 (three) times daily.   HYDROcodone-acetaminophen 10-325 MG tablet Commonly known as:  NORCO Take 1 tablet by mouth every 8 (eight) hours as needed for moderate pain.   Insulin Degludec 200 UNIT/ML Sopn Commonly known as:  TRESIBA FLEXTOUCH Inject 100 Units into the skin daily with breakfast.   levofloxacin 500 MG tablet Commonly known as:  LEVAQUIN Take 1 tablet (500 mg total) by mouth daily for 12 days.   lisinopril 5 MG tablet Commonly known as:  PRINIVIL,ZESTRIL Take 1 tablet (5 mg total) by mouth  daily.   naloxone 4 MG/0.1ML Liqd nasal spray kit Commonly known as:  NARCAN 1 spray into the nose as needed for overdose What changed:    how much to take  how to take this  when to take this  reasons to take this  additional instructions   pantoprazole 40 MG tablet Commonly known as:  PROTONIX Take 1 tablet (40 mg total) by mouth daily.   Pen Needles 31G X 6 MM Misc Use to inject insulin daily   Insulin Pen Needle 32G X 6 MM Misc Commonly known as:  NOVOFINE 1 Units by Does not apply route 3 (three) times daily.   SYMBICORT 160-4.5 MCG/ACT inhaler Generic drug:  budesonide-formoterol INHALE 2 PUFFS INTO THE LUNGS 2 TIMES DAILY   VICTOZA 18 MG/3ML Sopn Generic drug:  liraglutide INJECT 0.3 MILLILITERS (1.8MG TOTAL) INTO THE SKIN DAILY What changed:  See the new instructions.       Discharge Instructions: Please refer to Patient Instructions section of EMR for full details.  Patient was counseled important signs and symptoms that should prompt return to medical care, changes in medications, dietary instructions,  activity restrictions, and follow up appointments.   Follow-Up Appointments:   Nuala Alpha, DO 10/30/2017, 10:26 AM PGY-1, [facility]

## 2017-10-30 NOTE — H&P (Signed)
Family Medicine Teaching Service Daily Progress Note Intern Pager: [phone]  Patient name: [name] Medical record number: [id number] Date of birth: October 10, 1976 Age: 41 y.o. Gender: female  Primary Care Provider: [name], MD Consultants: None Code Status: Full  Pt Overview and Major Events to Date:  [name] is a 41 y.o. female presenting with L foot ulcer. PMH is significant for poorly controlled Type 2 DM, COPD, HLD, HTN, bipolar disorder, OSA, drug-seeking behavior.   Assessment and Plan:  Left Heel Diabetic Foot Ulcer: Patient states she is still having foot pain but this is chronic and sounds more like diabetic neuropathy. Foot Xray was negative for osteomyelitis. MRI was negative for osteomyelitis. Most likely etiology is a foot ulcer due to complications from uncontrolled diabetes. Patient with poorly-controlled diabetes with known neuropathy which likely contributed to formation of wound and poor healing. Followed by Dr. Amalia Hailey (podiatry). Awaiting wound culture results and likely deescalate to oral antibiotics and then d/c home. - Discontinue Vancomycin and Zosyn (11/24-11/25); switching to Levaquin po today giving her 12 days for total of 14 days - Wound culture pending - Continue home Norco q8 PRN  Type 2 DM: On 100U Tresiba qd, 25U Novolog with dinner, Victoza 1.8mg  qd, and gabapentin 1200mg  TID for neuropathy. Last A1C 11.6 on 08/18/17. CBG since admission have remained high in the 300s consistently.   - Resume home Tresidba at 100U - Continue Gabapentin 1200mg  TID - Compression stockings for help with bilateral foot pain  COPD: Improving. Continue Symbicort and albuterol PRN. Patient has a non-productive cough she attributes to allergies/changing weather. No difficulty breathing or SOB at this time. O2 sats are wnl. No significant change since admission. - Continue Symbicort - Continue Albuterol prn  Knee OA: On chronic Norco 10-325mg  q8 for pain relief  during exercise. Avoid Nubain as she states she feels like "things are crawling all over me". - Continue home Holt while in hospital - Tylenol PRN   HTN: On Coreg and lisinopril at home. Hypertensive on admission to 157/93, though possibly elevated secondary to pain. - Continue Coreg  HLD:  - Continue home atorvastatin  Bipolar disorder/depression: Not currently medically treated.  - Continue to monitor  FEN/GI: carb modified diet PPx: Lovenox  Disposition: likely d/c home today  Subjective:  [name] complains of continued feet pain. She states her home medications are not helping much and at home she wears compression stockings to help control pain. She did not bring any with her and would like to get some in the hospital if possible. Otherwise, she is doing well with no complaints.  She denies fever, chills, chest pain, or SOB  Objective: Temp:  [98.1 F (36.7 C)-98.6 F (37 C)] 98.6 F (37 C) (11/25 0819) Pulse Rate:  [86-97] 97 (11/25 0819) Resp:  [17-20] 18 (11/25 0819) BP: (101-134)/(75-100) 124/90 (11/25 0819) SpO2:  [96 %-100 %] 96 % (11/25 0819) Weight:  [308 lb 13.8 oz (140.1 kg)] 308 lb 13.8 oz (140.1 kg) (11/24 2122) Physical Exam: General: A&O x 3 Cardiovascular: RRR, Normal S1, S2, no murmurs, faint dorsalis pedis pulses Respiratory: CTAB, no wheezing, no crackles Abdomen: obese abdomen, non-distended, non-tender, +bs in all quadrants MSK: Left heel ulcer wrapped and bandaged Extremities: no edema, no cyanosis  Laboratory: Recent Labs  Lab 10/29/17 0245  WBC 13.2*  HGB 13.7  HCT 40.7  PLT 322   Recent Labs  Lab 10/29/17 0245 10/30/17 0524  NA 130* 131*  K 3.9 4.2  CL  94* 97*  CO2 26 25  BUN 22* 27*  CREATININE 0.76 0.94  CALCIUM 9.2 8.6*  PROT  --  6.2*  BILITOT  --  0.4  ALKPHOS  --  102  ALT  --  14  AST  --  11*  GLUCOSE 546* 401*   11/25 - Gram stain: abundant gram positive cocci and gram negative rods, few gram positive  rods 11/25 - Wound Cx: pending  Imaging/Diagnostic Tests:  Dg Foot Complete Left  Result Date: 10/29/2017 CLINICAL DATA:  Left foot ulcer and infection. Two rounds of antibiotics. EXAM: LEFT FOOT - COMPLETE 3+ VIEW COMPARISON:  09/26/2017 FINDINGS: Soft tissue defect over the posterior calcaneus consistent with history of ulceration. No underlying bone changes to suggest osteomyelitis. No evidence of acute fracture or dislocation in the left foot. No focal bone lesion or bone destruction. Bone cortex appears intact. Vascular calcifications in the soft tissues. Diffuse soft tissue calcification in the lower leg consistent with dystrophic calcification. IMPRESSION: Ulceration over the left heel. No evidence of osteomyelitis or other acute bony abnormality. Electronically Signed   By: Lucienne Capers M.D.   On: 10/29/2017 03:16   11/24 - MRI Left Foot: IMPRESSION Skin ulceration on the heel with mild appearing underlying cellulitis. Negative for abscess, osteomyelitis or septic joint.  Mild appearing plantar fasciitis.  Nuala Alpha, DO 10/30/2017, 8:45 AM PGY-1, [facility] Intern pager: [phone], text pages welcome

## 2017-10-30 NOTE — Discharge Instructions (Signed)
You were diagnosed with cellulitis which is a soft tissue infection in the left heel. Please follow wound care instructions and check your feet regularly for improving condition of your diabetic foot ulcer and make sure no other ulcers are developing. Please return to the ED immediately if you get a fever, chills, nausea, or overall feel like you are not improving.

## 2017-11-01 ENCOUNTER — Other Ambulatory Visit: Payer: Self-pay | Admitting: Family Medicine

## 2017-11-01 LAB — AEROBIC CULTURE  (SUPERFICIAL SPECIMEN)

## 2017-11-01 LAB — AEROBIC CULTURE W GRAM STAIN (SUPERFICIAL SPECIMEN)

## 2017-11-01 NOTE — Progress Notes (Signed)
This encounter was created in error - please disregard.

## 2017-11-02 ENCOUNTER — Inpatient Hospital Stay: Payer: Self-pay | Admitting: Internal Medicine

## 2017-11-02 ENCOUNTER — Ambulatory Visit: Payer: Self-pay | Admitting: Cardiovascular Disease

## 2017-11-02 MED ORDER — LISINOPRIL 5 MG PO TABS: 5.0000 mg | ORAL_TABLET | Freq: Every day | ORAL | 5 refills | Status: DC

## 2017-11-02 NOTE — Progress Notes (Signed)
Subjective:    Patient ID: [name], female    DOB: Oct 23, 1976, 41 y.o.   MRN: [id number]   CC: Hospital follow up   HPI: Hospital follow up Admitted from 10/29/2017-10/30/2017 for diabetic foot ulcer with concern for osteomyelitis   Foot ulcer Patient was admitted for diabetic foot ulcer with concern of osteomyelitis. Patient was discharged with 12 days of Levaquin to complete 14day course antibiotics. MRI and Xray negative for osteomyelitis. Wound culture from 10/30/2017 showing multiple organisms, likely contaminant.   Patient today states that she has been compliant on taking Levaquin, only received antibiotics on 11/01/17 from mail order service but notices no improvement. States that ulcer "looks the same" and has 8-9/10 pain constantly. Patient states that pain can become "intense" occasionally. Patient notes yellow/bloody drainage and odor from ulcer. Denies fever, chills, nausea, vomiting, or diarrhea. Patient states she has not been putting pressure on foot by instructions from physician in hospital. Patient has appointment with Dr. Amalia Hailey, podiatrist, on 11/09/17.   Diabetes Current regimen: 100U Tresiba qd, 25U Novolog with dinner, Victoza 1.8mg  qd, and gabapentin1200mg  TIDfor neuropathy. Last A1C 13.7 on 10/29/2017. Patient reports compliance with medications but states she has a poor diet, rich in carbs and sweets. Patient checks sugars at home and notes fasting sugars in high 100s and post prandial sugars in 200s. Patient does not exercise anymore due to foot ulcer.    Objective:  BP 134/82   Pulse 95   Temp 97.8 F (36.6 C) (Oral)   Ht 5\' 2"  (1.575 m)   Wt (!) 303 lb (137.4 kg)   LMP 10/10/2017   SpO2 96%   BMI 55.42 kg/m  Vitals and nursing note reviewed  General: well nourished, in no acute distress Cardiac: RRR, clear S1 and S2, no murmurs, rubs, or gallops Respiratory: clear to auscultation bilaterally, no increased work of breathing Abdomen: soft,  nontender, nondistended, no masses or organomegaly. Bowel sounds present Extremities: no edema or cyanosis. Warm, well perfused. Thready pulses in left foot. 2sec cap refill. Sensation intact. Tenderness to palpation or ulcer, clear drainage noted, not excessively warm.   Neuro: alert and oriented, no focal deficits Skin:      Assessment & Plan:    Diabetic foot ulcer ([place]) Patient today presenting for hospital follow up of diabetic foot ulcer on left heel. Appears to have decrease in depth, from previous picture taken in hospital, with only clear drainage. No purulence noted. No excessive warmth or erythema so less likely worsening in infection.  -continue levaquin to complete course  -referral to wound clinic  -follow up with podiatry -follow up in 3 weeks to monitor after finishing antibiotic course  -strict return precautions given  -patient should be encouraged to have increased protein intake to promote wound healting  Insulin dependent diabetes mellitus ([place]) Last A1C of 13.7 on 10/29/17. Patient reports compliance with current regimen (Current regimen: 100U Tresiba qd, 25U Novolog with dinner, Victoza 1.8mg  qd, and gabapentin1200mg  TIDfor neuropathy), however reports poor diet and no exercise. Patient is to be seen by Dr. Valentina Lucks on 11/10/17 for diabetes management.  -will not change medications at this time as patient is seeing Dr. Valentina Lucks next week and will likely change medications at that time.  -encouraged healthy diet. Patient verbalized understanding. Informed patient that she should limit carbohydrates and sweets as this will cause poor glycemic control and also not allow for proper wound healing.    Return in about 3 weeks (around 11/24/2017) for Diabetic foot  ulcer.   [name], DO, PGY-1

## 2017-11-02 NOTE — Telephone Encounter (Signed)
[name], I got a letter saying that Medicaid denied this request because they need documentation of why she needs this treatment. Did we send records? I'm going to place the letter in the RN office. Thanks [name], MD

## 2017-11-03 ENCOUNTER — Other Ambulatory Visit: Payer: Self-pay

## 2017-11-03 ENCOUNTER — Ambulatory Visit (INDEPENDENT_AMBULATORY_CARE_PROVIDER_SITE_OTHER): Payer: Medicaid Other | Admitting: Family Medicine

## 2017-11-03 ENCOUNTER — Encounter: Payer: Self-pay | Admitting: Family Medicine

## 2017-11-03 VITALS — BP 134/82 | HR 95 | Temp 97.8°F | Ht 62.0 in | Wt 303.0 lb

## 2017-11-03 DIAGNOSIS — L97429 Non-pressure chronic ulcer of left heel and midfoot with unspecified severity: Secondary | ICD-10-CM | POA: Diagnosis not present

## 2017-11-03 DIAGNOSIS — E11621 Type 2 diabetes mellitus with foot ulcer: Secondary | ICD-10-CM | POA: Diagnosis not present

## 2017-11-03 DIAGNOSIS — E119 Type 2 diabetes mellitus without complications: Secondary | ICD-10-CM

## 2017-11-03 DIAGNOSIS — IMO0001 Reserved for inherently not codable concepts without codable children: Secondary | ICD-10-CM

## 2017-11-03 DIAGNOSIS — Z794 Long term (current) use of insulin: Secondary | ICD-10-CM

## 2017-11-03 NOTE — Assessment & Plan Note (Signed)
Last A1C of 13.7 on 10/29/17. Patient reports compliance with current regimen (Current regimen: 100U Tresiba qd, 25U Novolog with dinner, Victoza 1.8mg  qd, and gabapentin1200mg  TIDfor neuropathy), however reports poor diet and no exercise. Patient is to be seen by Dr. Valentina Lucks on 11/10/17 for diabetes management.  -will not change medications at this time as patient is seeing Dr. Valentina Lucks next week and will likely change medications at that time.  -encouraged healthy diet. Patient verbalized understanding. Informed patient that she should limit carbohydrates and sweets as this will cause poor glycemic control and also not allow for proper wound healing.

## 2017-11-03 NOTE — Assessment & Plan Note (Addendum)
Patient today presenting for hospital follow up of diabetic foot ulcer on left heel. Appears to have decrease in depth, from previous picture taken in hospital, with only clear drainage. No purulence noted. No excessive warmth or erythema so less likely worsening in infection.  -continue levaquin to complete course  -referral to wound clinic  -follow up with podiatry -follow up in 3 weeks to monitor after finishing antibiotic course  -strict return precautions given  -patient should be encouraged to have increased protein intake to promote wound healting

## 2017-11-03 NOTE — Patient Instructions (Addendum)
Diabetes and Foot Care Diabetes may cause you to have problems because of poor blood supply (circulation) to your feet and legs. This may cause the skin on your feet to become thinner, break easier, and heal more slowly. Your skin may become dry, and the skin may peel and crack. You may also have nerve damage in your legs and feet causing decreased feeling in them. You may not notice minor injuries to your feet that could lead to infections or more serious problems. Taking care of your feet is one of the most important things you can do for yourself. Follow these instructions at home:  Wear shoes at all times, even in the house. Do not go barefoot. Bare feet are easily injured.  Check your feet daily for blisters, cuts, and redness. If you cannot see the bottom of your feet, use a mirror or ask someone for help.  Wash your feet with warm water (do not use hot water) and mild soap. Then pat your feet and the areas between your toes until they are completely dry. Do not soak your feet as this can dry your skin.  Apply a moisturizing lotion or petroleum jelly (that does not contain alcohol and is unscented) to the skin on your feet and to dry, brittle toenails. Do not apply lotion between your toes.  Trim your toenails straight across. Do not dig under them or around the cuticle. File the edges of your nails with an emery board or nail file.  Do not cut corns or calluses or try to remove them with medicine.  Wear clean socks or stockings every day. Make sure they are not too tight. Do not wear knee-high stockings since they may decrease blood flow to your legs.  Wear shoes that fit properly and have enough cushioning. To break in new shoes, wear them for just a few hours a day. This prevents you from injuring your feet. Always look in your shoes before you put them on to be sure there are no objects inside.  Do not cross your legs. This may decrease the blood flow to your feet.  If you find a  minor scrape, cut, or break in the skin on your feet, keep it and the skin around it clean and dry. These areas may be cleansed with mild soap and water. Do not cleanse the area with peroxide, alcohol, or iodine.  When you remove an adhesive bandage, be sure not to damage the skin around it.  If you have a wound, look at it several times a day to make sure it is healing.  Do not use heating pads or hot water bottles. They may burn your skin. If you have lost feeling in your feet or legs, you may not know it is happening until it is too late.  Make sure your health care provider performs a complete foot exam at least annually or more often if you have foot problems. Report any cuts, sores, or bruises to your health care provider immediately. Contact a health care provider if:  You have an injury that is not healing.  You have cuts or breaks in the skin.  You have an ingrown nail.  You notice redness on your legs or feet.  You feel burning or tingling in your legs or feet.  You have pain or cramps in your legs and feet.  Your legs or feet are numb.  Your feet always feel cold. Get help right away if:  There is increasing   redness, swelling, or pain in or around a wound.  There is a red line that goes up your leg.  Pus is coming from a wound.  You develop a fever or as directed by your health care provider.  You notice a bad smell coming from an ulcer or wound. This information is not intended to replace advice given to you by your health care provider. Make sure you discuss any questions you have with your health care provider. Document Released: 11/19/2000 Document Revised: 04/29/2016 Document Reviewed: 05/01/2013 Elsevier Interactive Patient Education  2017 [employer].   It was a pleasure seeing you today.   Today we discussed your foot ulcer and diabetes  For your foot ulcer: please continue the antibiotics to finish the course. I have referred you to the wound clinic  for possible debridement and cleaning. Please also keep your podiatry appointment. Please follow up in 3 weeks for a re-check, if you have fever/chills, it drains a purulent (pus filled) fluid, gets more red or tender follow up sooner.   [facility]'s number is: [phone]  For your diabetes: please keep that follow up appointment with Dr. Valentina Lucks next week. I have not changed medications since you will see him in a week for diabetes. Please use diet and lifestyle modifications as well and decrease carbohydrate and sweets intake to help control your sugars.   Please follow up in 3 weeks or sooner if symptoms persist or worsen. Please call the clinic immediately if you have concerns.   Our clinic's number is [phone]. Please call with questions or concerns.   Thank you,  [name], DO

## 2017-11-03 NOTE — Assessment & Plan Note (Signed)
>>  ASSESSMENT AND PLAN FOR INSULIN DEPENDENT TYPE 2 DIABETES MELLITUS ([place]) WRITTEN ON 11/03/2017  2:48 PM BY ABRAHAM, SHERIN, DO  Last A1C of 13.7 on 10/29/17. Patient reports compliance with current regimen (Current regimen: 100U Tresiba qd, 25U Novolog with dinner, Victoza 1.8mg  qd, and gabapentin1200mg  TIDfor neuropathy), however reports poor diet and no exercise. Patient is to be seen by Dr. Valentina Lucks on 11/10/17 for diabetes management.  -will not change medications at this time as patient is seeing Dr. Valentina Lucks next week and will likely change medications at that time.  -encouraged healthy diet. Patient verbalized understanding. Informed patient that she should limit carbohydrates and sweets as this will cause poor glycemic control and also not allow for proper wound healing.

## 2017-11-04 NOTE — Telephone Encounter (Signed)
Contacted Medicaid [place] @ [id number].  They do not show record of refaxed prior auth with notes.  Will fax AGAIN to [phone] and will call back next week to check status.    Pt will be out of "30 day maintenance" supply on 11/15/17. [name], Salome Spotted, CMA

## 2017-11-09 ENCOUNTER — Ambulatory Visit: Payer: Medicaid Other | Admitting: Podiatry

## 2017-11-09 ENCOUNTER — Ambulatory Visit: Payer: Self-pay | Admitting: Internal Medicine

## 2017-11-09 ENCOUNTER — Encounter: Payer: Self-pay | Admitting: Podiatry

## 2017-11-09 ENCOUNTER — Other Ambulatory Visit: Payer: Self-pay

## 2017-11-09 DIAGNOSIS — L97522 Non-pressure chronic ulcer of other part of left foot with fat layer exposed: Secondary | ICD-10-CM | POA: Diagnosis not present

## 2017-11-09 DIAGNOSIS — E0842 Diabetes mellitus due to underlying condition with diabetic polyneuropathy: Secondary | ICD-10-CM

## 2017-11-09 DIAGNOSIS — I70245 Atherosclerosis of native arteries of left leg with ulceration of other part of foot: Secondary | ICD-10-CM

## 2017-11-09 MED ORDER — LEVOFLOXACIN 500 MG PO TABS
500.0000 mg | ORAL_TABLET | Freq: Every day | ORAL | 0 refills | Status: AC
Start: 2017-11-09 — End: 2017-11-19

## 2017-11-09 MED ORDER — LEVOFLOXACIN 500 MG PO TABS: 500.0000 mg | ORAL_TABLET | Freq: Every day | ORAL | 0 refills | Status: DC

## 2017-11-10 ENCOUNTER — Telehealth: Payer: Self-pay | Admitting: Family Medicine

## 2017-11-10 ENCOUNTER — Ambulatory Visit: Payer: Self-pay | Admitting: Pharmacist

## 2017-11-10 NOTE — Telephone Encounter (Signed)
Contacted [employer], med was approved from 11/04/17 - 529/19. PA # [id number]   Attempted to call pt.   No answer and no machine.  [facility]  pharmacy aware. Fleeger, Salome Spotted, CMA

## 2017-11-10 NOTE — Telephone Encounter (Signed)
Contacted [employer], med was approved from 11/04/17 - 529/19. PA # [id number]   Attempted to call pt.   No answer and no machine.  [facility]  pharmacy aware. Madhav Mohon, Salome Spotted, CMA

## 2017-11-10 NOTE — Telephone Encounter (Signed)
Pt is calling because she needed to know if we where able to get the PA for her pain med's. They gave her a 30 day supple but she will be out on the 11th and what to check ahead of time to get this corrected. jw

## 2017-11-11 NOTE — Telephone Encounter (Signed)
Pt is aware.  

## 2017-11-13 NOTE — Progress Notes (Signed)
Subjective:  Patient with a history of diabetes mellitus presents today for follow-up evaluation of an ulceration to the left heel.  She states she was discharged from [facility] on 10/30/17.  She was prescribed levofloxacin which she has now completed.  She reports having an MRI done on 10/29/17 and was negative for osteomyelitis.  Patient is here for further evaluation and treatment.   Past Medical History:  Diagnosis Date  . Alveolar hypoventilation   . Anemia    not on iron pill  . Arthritis   . Asthma   . Bipolar 2 disorder ([place])   . Carpal tunnel syndrome on right    recurrent  . Cellulitis 08/2010-08/2011  . Chronic pain   . COPD (chronic obstructive pulmonary disease) ([name])    Symbicort daily and Proventil as needed  . Costochondritis   . Depression   . Diabetes mellitus 2000   Type 2, Uncontrolled.Takes Lantus daily.Fasting blood sugar runs 150  . Dizziness    occasionally  . Drug-seeking behavior   . GERD (gastroesophageal reflux disease)    takes Pantoprazole and Zantac daily  . Headache    migraine-last one about a yr ago.Topamax daily  . HLD (hyperlipidemia)    takes Atorvastatin daily  . Hypertension    takes Lisinopril and Coreg daily  . Morbid obesity ([place])   . Muscle spasm    takes Flexeril as needed  . Nocturia   . Obstructive sleep apnea   . Peripheral neuropathy    takes Gabapentin daily  . Pneumonia    "walking" several yrs ago and as a baby  . Rectal fissure   . Restless leg   . Urinary frequency   . Varicose veins    Right medial thigh and Left leg     Objective/Physical Exam General: The patient is alert and oriented x3 in no acute distress.  Dermatology:  Wound #1 noted to the left heel measuring approximately 1.0 x 1.0 x 0.6 cm (LxWxD).   To the noted ulceration(s), there is no eschar. There is a moderate amount of slough, fibrin, and necrotic tissue noted. Granulation tissue and wound base is red. There is a minimal amount of  serosanguineous drainage noted. There is no exposed bone muscle-tendon ligament or joint. There is no malodor. Periwound integrity is intact. Skin is warm, dry and supple bilateral lower extremities.  Vascular: Palpable pedal pulses bilaterally. No edema or erythema noted. Capillary refill within normal limits.  Neurological: Epicritic and protective threshold absent bilaterally.   Musculoskeletal Exam: Range of motion within normal limits to all pedal and ankle joints bilateral. Muscle strength 5/5 in all groups bilateral.    MRI Impression: Skin ulceration on the heel with mild appearing underlying cellulitis. Negative for abscess, osteomyelitis or septic joint. Mild appearing plantar fasciitis.  Assessment: #1 left heel ulcer secondary to diabetes mellitus #2 diabetes mellitus w/ peripheral neuropathy   Plan of Care:  #1 Patient was evaluated.  #2 medically necessary excisional debridement including muscle and deep fascial tissue was performed using a tissue nipper and a chisel blade. Excisional debridement of all the necrotic nonviable tissue down to healthy bleeding viable tissue was performed with post-debridement measurements same as pre-. #3 The wound was cleansed and dry sterile dressing applied. #4 follow-up at [facility] wound care center.  Patient has appointment next week. #5 refill prescription for Levaquin 500 mg x 12 days provided to patient. #6 Return to clinic as needed.   Edrick Kins, DPM [facility]  Dr. Edrick Kins, [address]                                        [place], [place] [address]                Office [phone]  Fax [phone]

## 2017-11-15 ENCOUNTER — Ambulatory Visit: Payer: Self-pay | Admitting: Internal Medicine

## 2017-11-16 ENCOUNTER — Ambulatory Visit: Payer: Self-pay | Admitting: Internal Medicine

## 2017-11-17 ENCOUNTER — Other Ambulatory Visit: Payer: Self-pay | Admitting: Family Medicine

## 2017-11-17 NOTE — Telephone Encounter (Signed)
Pt called back about her medication. She says she has her Rx for it but she cant fill it until Saturday. Since shes going out of town today she wants it filled earlier. Pt gave me this number and said she can be reached at this number [phone]

## 2017-11-17 NOTE — Telephone Encounter (Signed)
Pharmacy informed. Fleeger, Jessica Dawn, CMA  

## 2017-11-17 NOTE — Telephone Encounter (Signed)
Phone is not working. She cannot be called back.  She is going out of town today and wanted to get her medicine filled.  It is her hydrocodone.  Dr needs to ok to fill it early.  [facility] on [address].

## 2017-11-17 NOTE — Telephone Encounter (Signed)
Patient has never before asked for early refills. Reviewed [place] CSRS and findings are appropriate. I will authorize it this one time. Please call pharmacy and ok the early fill.  Thanks [name], MD

## 2017-11-21 ENCOUNTER — Other Ambulatory Visit: Payer: Self-pay | Admitting: Family Medicine

## 2017-11-24 IMAGING — MR MR KNEE*R* W/O CM
5 series · 35 of 40 positions shown · non-contrast
Comparison: None.

CLINICAL DATA: Right knee pain since 08/08/2016.  No known injury.

EXAM:
MRI OF THE RIGHT KNEE WITHOUT CONTRAST
TECHNIQUE: Multiplanar, multisequence MR imaging of the knee was performed. No
intravenous contrast was administered.

[Series 9: PD fat-sat · axial · right · 4.0mm · 0.47mm/px · z∈[-36,+86]mm · 6 of 29 slices shown (1 of 3)]
[im 1/29]
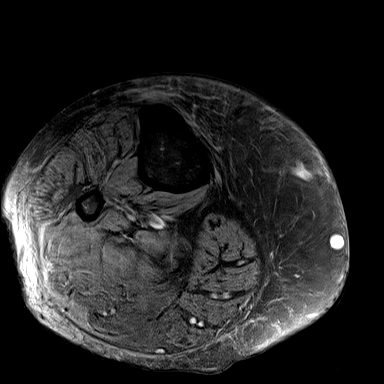
[im 6/29]
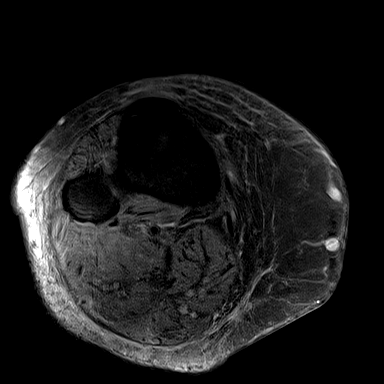
[im 12/29]
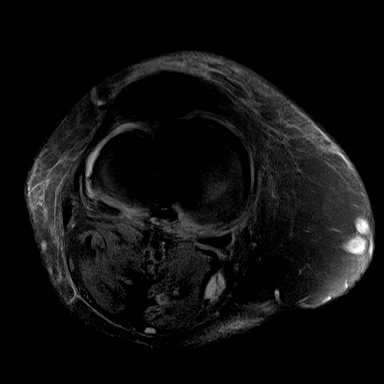
[im 17/29]
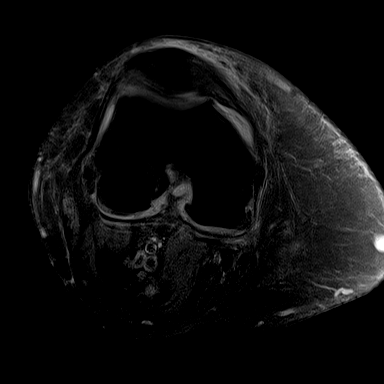
[im 23/29]
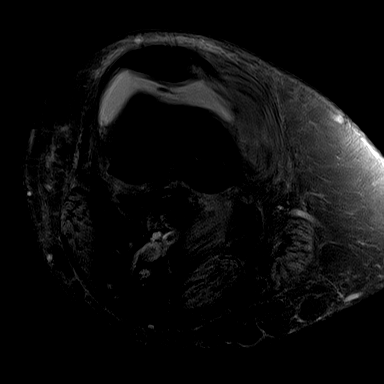
[im 29/29]
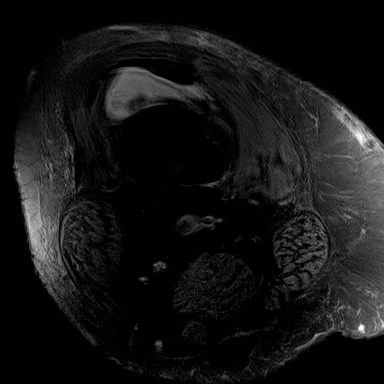

[Series 10: T1 · coronal · right · 3.0mm · 0.47mm/px · 4 of 34 slices shown]
[im 1/34]
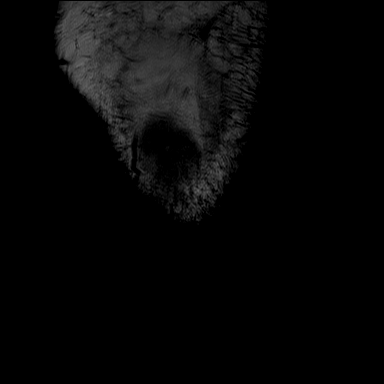
[im 5/34]
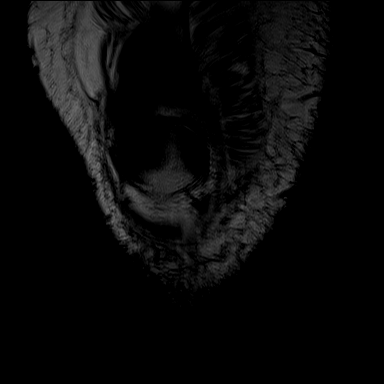
[im 9/34]
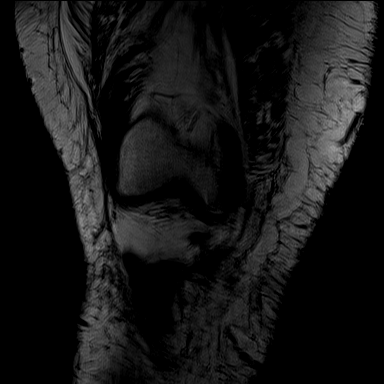
[im 13/34]
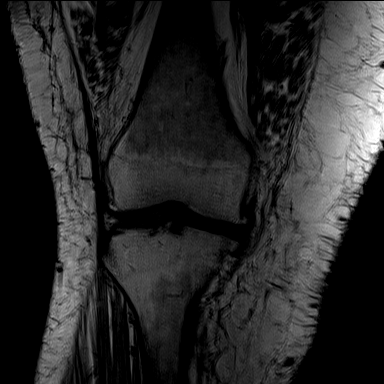

[Series 11: PD fat-sat · coronal · right · 3.0mm · 0.47mm/px · 9 of 34 slices shown (2 of 3)]
[im 1/34]
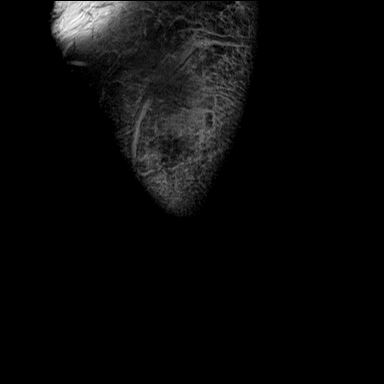
[im 5/34]
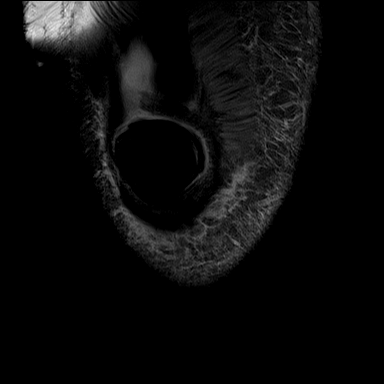
[im 9/34]
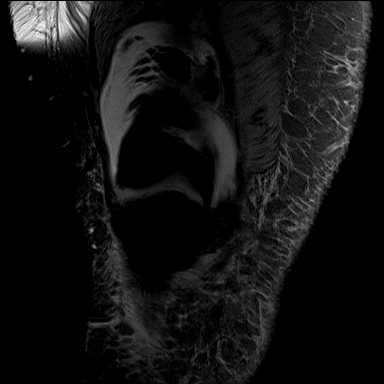
[im 13/34]
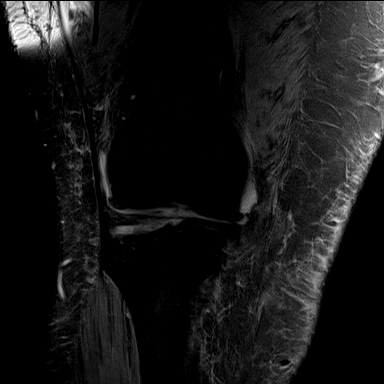
[im 17/34]
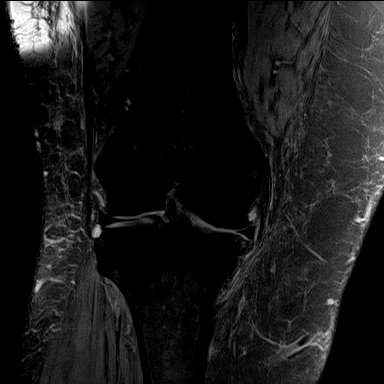
[im 21/34]
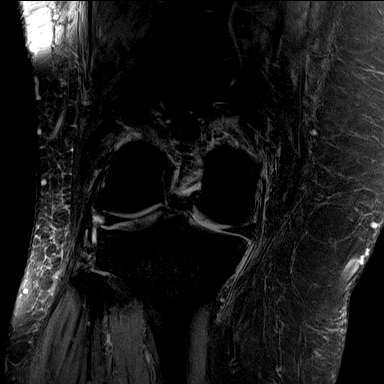
[im 25/34]
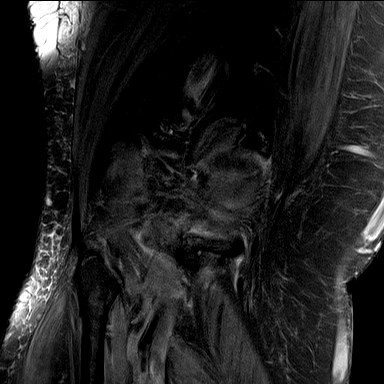
[im 29/34]
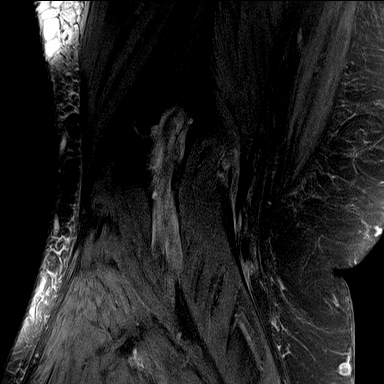
[im 34/34]
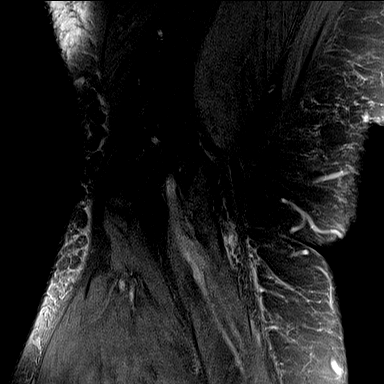

[Series 12: PD fat-sat · sagittal · right · 3.0mm · 0.53mm/px · 7 of 26 slices shown (3 of 3)]
[im 1/26]
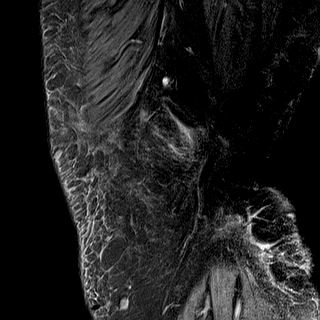
[im 5/26]
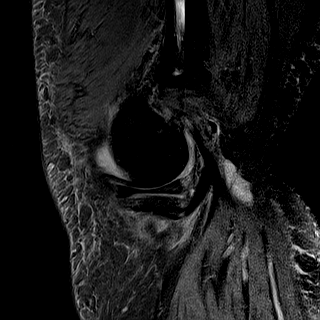
[im 9/26]
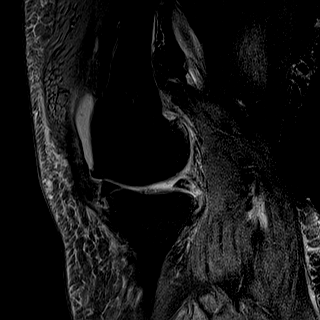
[im 13/26]
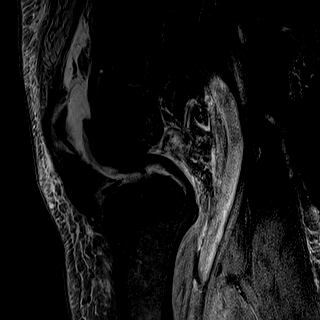
[im 17/26]
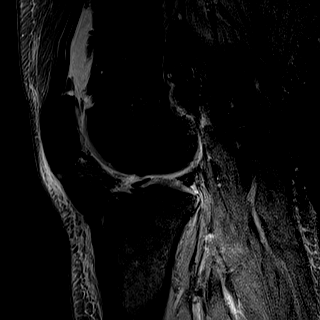
[im 21/26]
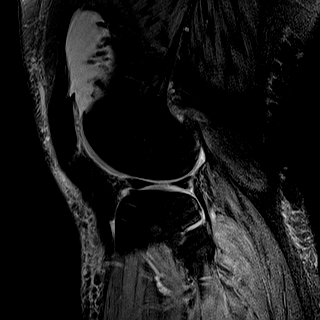
[im 26/26]
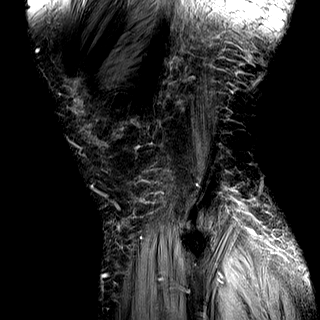

[Series 13: T2 fat-sat · coronal · right · 3.0mm · 0.47mm/px · 9 of 34 slices shown]
[im 1/34]
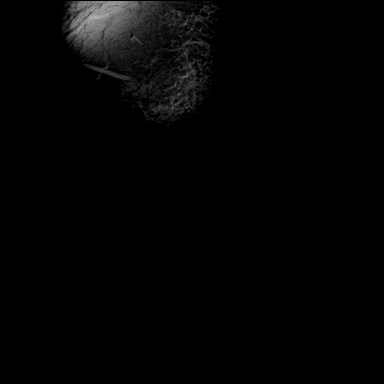
[im 5/34]
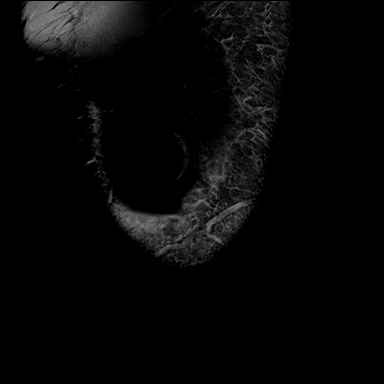
[im 9/34]
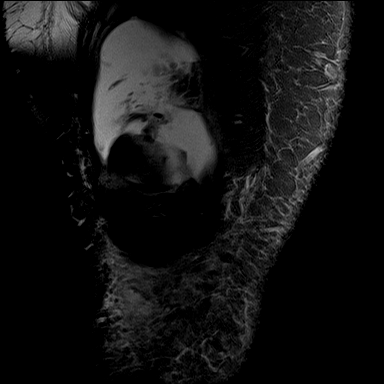
[im 13/34]
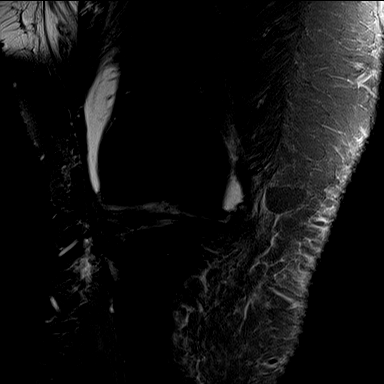
[im 17/34]
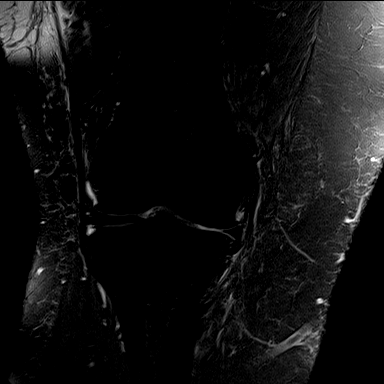
[im 21/34]
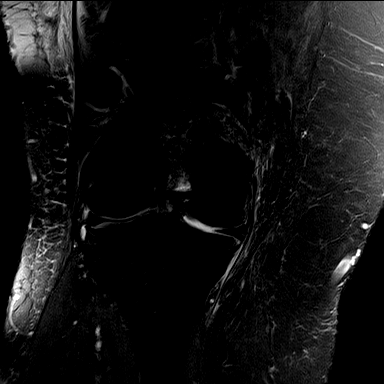
[im 25/34]
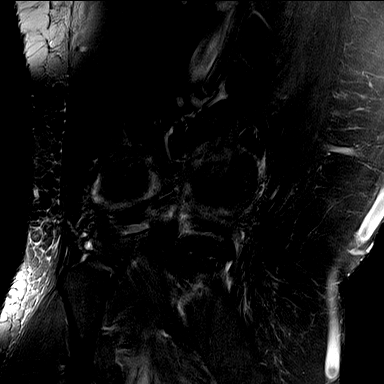
[im 29/34]
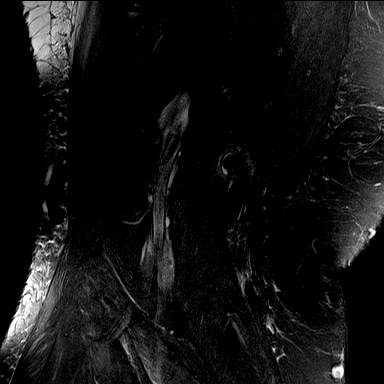
[im 34/34]
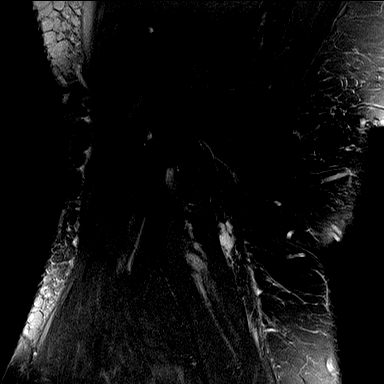

[35 of 40 positions shown; findings below may reference images not displayed]

FINDINGS: MENISCI

Medial meniscus: Radial tear of the posterior horn of the medial
meniscus with peripheral meniscal extrusion.

Lateral meniscus: Small undersurface tear of the posterior horn of
the lateral meniscus adjacent to the meniscal root.

LIGAMENTS

Cruciates:  Intact ACL and PCL.

Collaterals: Medial collateral ligament is intact. Lateral
collateral ligament complex is intact.

CARTILAGE

Patellofemoral: Partial-thickness cartilage loss the medial and
lateral patellar facets. Partial thickness cartilage loss of the
medial trochlea.

Medial: High-grade partial-thickness cartilage loss of the medial
femoral condyle and medial tibial plateau.

Lateral:  No chondral defect.

Joint: Large joint effusion. Normal Hoffa's fat. No plical
thickening.

Popliteal Fossa:  Small Baker cyst.  Intact popliteus tendon.

Extensor Mechanism: Intact quadriceps tendon and patellar tendon.
Intact medial and lateral patellar retinaculum. Intact MPFL.

Bones: No marrow signal abnormality. No acute fracture or
dislocation.

Other: No fluid collection or hematoma.
IMPRESSION: 1. Radial tear of the posterior horn of the medial meniscus with
peripheral meniscal extrusion.
2. Small undersurface tear of the posterior horn of the lateral
meniscus adjacent to the meniscal root.
3. High-grade partial-thickness cartilage loss of the medial femoral
condyle and medial tibial plateau.
4. Partial-thickness cartilage loss the medial and lateral patellar
facets. Partial thickness cartilage loss of the medial trochlea.
5. Large joint effusion.

## 2017-11-25 ENCOUNTER — Encounter: Payer: Self-pay | Admitting: Family Medicine

## 2017-11-25 ENCOUNTER — Other Ambulatory Visit: Payer: Self-pay

## 2017-11-25 ENCOUNTER — Ambulatory Visit (INDEPENDENT_AMBULATORY_CARE_PROVIDER_SITE_OTHER): Payer: Medicaid Other | Admitting: Family Medicine

## 2017-11-25 DIAGNOSIS — G8929 Other chronic pain: Secondary | ICD-10-CM

## 2017-11-25 DIAGNOSIS — J45909 Unspecified asthma, uncomplicated: Secondary | ICD-10-CM

## 2017-11-25 DIAGNOSIS — E119 Type 2 diabetes mellitus without complications: Secondary | ICD-10-CM

## 2017-11-25 DIAGNOSIS — Z794 Long term (current) use of insulin: Secondary | ICD-10-CM | POA: Diagnosis not present

## 2017-11-25 DIAGNOSIS — IMO0001 Reserved for inherently not codable concepts without codable children: Secondary | ICD-10-CM

## 2017-11-25 MED ORDER — HYDROCODONE-ACETAMINOPHEN 10-325 MG PO TABS: 1.0000 | ORAL_TABLET | Freq: Three times a day (TID) | ORAL | 0 refills | Status: DC | PRN

## 2017-11-25 NOTE — Progress Notes (Signed)
Date of Visit: 11/25/2017   HPI:  [name] presents for routine follow up.  Pain medication refill - needs refill on norco. Storing safely, has a lock box. Medication helps her stay active. Has been a little limited in her ability to exercise lately due to an ulcer on her heel. States this is now healing well. Pain is mostly in her knees. Mood is doing well. No SI/HI.  Diabetes - currently taking 100 units tresiba daily, and novolog 25 units with dinner. Also on victoza 1.8mg  daily. Fasting sugars have been 150s-170s lately. No low sugars. Needs reminder about the cream I reocmmended for her neuropathy (capsaicin) - she forgot the name of it.  Dry cough - has had dry cough for about a week. Thinks she's wheezed some but admits it could be upper airway in location. Using albuterol every other day for the last week.   ROS: See HPI.  [place]: history of type 2 diabetes, chronic pain, morbid obesity, GERD, hypertension, asthma, hyperlipidemia   PHYSICAL EXAM: BP 138/82   Pulse 89   Temp (!) 97.5 F (36.4 C) (Oral)   Ht 5\' 2"  (1.575 m)   Wt (!) 307 lb 3.2 oz (139.3 kg)   SpO2 95%   BMI 56.19 kg/m  Gen: no acute distress, pleasant, cooperative HEENT: normocephalic, atraumatic, moist mucous membranes  Heart: regular rate and rhythm, no murmur Lungs: clear to auscultation bilaterally, normal work of breathing. No wheezes or crackles appreciated. Neuro: alert, grossly nonfocal, speech normal Ext: bilateral knees with some crepitus during active flexion & extension. No warmth or effusion.  ASSESSMENT/PLAN:  Asthma No wheezing heard on exam today Suspect it was an upper airway sound patient heard when she said she was "wheezing" Lungs are clear, no crackles, afebrile Will not start steroids today give her good clinical appearance and wanting to avoid iatrogenic hyperglycemia if possible. Patient to follow up if not improving in the next week, sooner if worsening.  Encounter for chronic pain  management Chronic pain stable. Mood also stable, patient denies SI/HI. [place] controlled substances database reviewed, with appropriate findings. Will refill x3 months. Sent in electronically to patient's pharmacy.  FOLLOW UP: Follow up in 3 months with me for above issues Schedule with Dr. Valentina Lucks for diabetes   [name]. [name], [place]

## 2017-11-25 NOTE — Patient Instructions (Addendum)
Go up to 105 units on your tresiba Schedule follow up with Dr. Valentina Lucks  Refilled pain medications x3 months - sent these to your pharmacy  [facility] cream [facility]  Follow up with me in 3 months, sooner if needed  Be well, Dr. [name]

## 2017-11-28 NOTE — Assessment & Plan Note (Signed)
>>  ASSESSMENT AND PLAN FOR INSULIN DEPENDENT TYPE 2 DIABETES MELLITUS ([name]) WRITTEN ON 11/28/2017  9:35 AM BY Saretta Dahlem, [name], MD  Elevated fastings 150s-170s lately. Increase tresiba to 105 units daily. Patient to schedule follow up with Dr. Valentina Lucks for diabetes.

## 2017-11-28 NOTE — Assessment & Plan Note (Signed)
Elevated fastings 150s-170s lately. Increase tresiba to 105 units daily. Patient to schedule follow up with Dr. Valentina Lucks for diabetes.

## 2017-11-28 NOTE — Assessment & Plan Note (Signed)
Chronic pain stable. Mood also stable, patient denies SI/HI.  controlled substances database reviewed, with appropriate findings. Will refill x3 months. Sent in electronically to patient's pharmacy.

## 2017-11-28 NOTE — Assessment & Plan Note (Signed)
No wheezing heard on exam today Suspect it was an upper airway sound patient heard when she said she was "wheezing" Lungs are clear, no crackles, afebrile Will not start steroids today give her good clinical appearance and wanting to avoid iatrogenic hyperglycemia if possible. Patient to follow up if not improving in the next week, sooner if worsening.

## 2017-12-01 ENCOUNTER — Encounter: Payer: Self-pay | Admitting: Cardiovascular Disease

## 2017-12-01 ENCOUNTER — Other Ambulatory Visit: Payer: Self-pay | Admitting: Family Medicine

## 2017-12-05 ENCOUNTER — Ambulatory Visit: Payer: Self-pay | Admitting: Physician Assistant

## 2017-12-07 ENCOUNTER — Telehealth: Payer: Self-pay | Admitting: *Deleted

## 2017-12-07 ENCOUNTER — Telehealth: Payer: Self-pay | Admitting: Family Medicine

## 2017-12-07 DIAGNOSIS — L97509 Non-pressure chronic ulcer of other part of unspecified foot with unspecified severity: Principal | ICD-10-CM

## 2017-12-07 DIAGNOSIS — E11621 Type 2 diabetes mellitus with foot ulcer: Secondary | ICD-10-CM

## 2017-12-07 MED ORDER — LEVOFLOXACIN 500 MG PO TABS: 500.0000 mg | ORAL_TABLET | Freq: Every day | ORAL | 0 refills | Status: DC

## 2017-12-07 MED ORDER — SULFAMETHOXAZOLE-TRIMETHOPRIM 800-160 MG PO TABS: 1.0000 | ORAL_TABLET | Freq: Two times a day (BID) | ORAL | 0 refills | Status: DC

## 2017-12-07 NOTE — Telephone Encounter (Signed)
Pt returning McIntyre's call. Pt said she is at home all day so she will be waiting by the phone.

## 2017-12-07 NOTE — Telephone Encounter (Signed)
I spoke with Dr. Amalia Hailey concerning the order a different antibiotic. Dr. Amalia Hailey said ordered Levaquin 500mg  #14 one daily.

## 2017-12-07 NOTE — Telephone Encounter (Signed)
Attempted to call pt, no answer/voicemail set up. Will try again later. Giovanna Kemmerer, CMA  

## 2017-12-07 NOTE — Telephone Encounter (Signed)
Pt has been referred to wound care in [place] but doesn't have transportation there.  Please refer to somewhere in [place].  She also needs a refill on the antibotic for her foot. She thinks it in infectied again.  [place]

## 2017-12-07 NOTE — Telephone Encounter (Signed)
I informed pt of Dr. Amalia Hailey order for Bactrim DS. Pt states the last antibiotic he prescribed was for 2 times a day and started with L, Bactrim DS didn't work.

## 2017-12-07 NOTE — Telephone Encounter (Signed)
Sure, please prescribe Bactrim DS #28 q12h  Thanks, Dr. Amalia Hailey

## 2017-12-07 NOTE — Telephone Encounter (Signed)
Pt states she was given an antibiotic and she has been off it for a while and her foot looks infected again and she would like another refill of the antibiotic. I spoke with pt and told her I would send a message to Dr. Amalia Hailey for the antibiotic, but we did need her to come in to see a doctor.

## 2017-12-07 NOTE — Telephone Encounter (Signed)
Will forward to MD regarding medication refill and also referral coordinator regarding change in location. Ashley Montminy,CMA

## 2017-12-07 NOTE — Telephone Encounter (Signed)
[name] - Scheduler states pt said she could not come in until next week.

## 2017-12-07 NOTE — Telephone Encounter (Signed)
I sent in a new referral. Regarding the foot, I would like her to come in to be seen either at the [facility] or at the podiatrist office first before we prescribe antibiotics. Please have her schedule an appointment with [place] or with her podiatrist.  Dr. Mingo Amber will be covering for me the rest of the week.  Thanks [name], MD

## 2017-12-08 NOTE — Telephone Encounter (Signed)
I tried to call patient but phone only ringed and no VM. Patient has appointment next week 12/14/17 at the [facility]. jw

## 2017-12-09 LAB — HM DIABETES EYE EXAM

## 2017-12-09 IMAGING — DX DG CHEST 1V
1 series · 1 of 1 positions shown · non-contrast
Comparison: Chest radiograph performed 06/26/2016

CLINICAL DATA: Acute onset of tachycardia.  Initial encounter.

EXAM:
CHEST 1 VIEW

[chest ap]
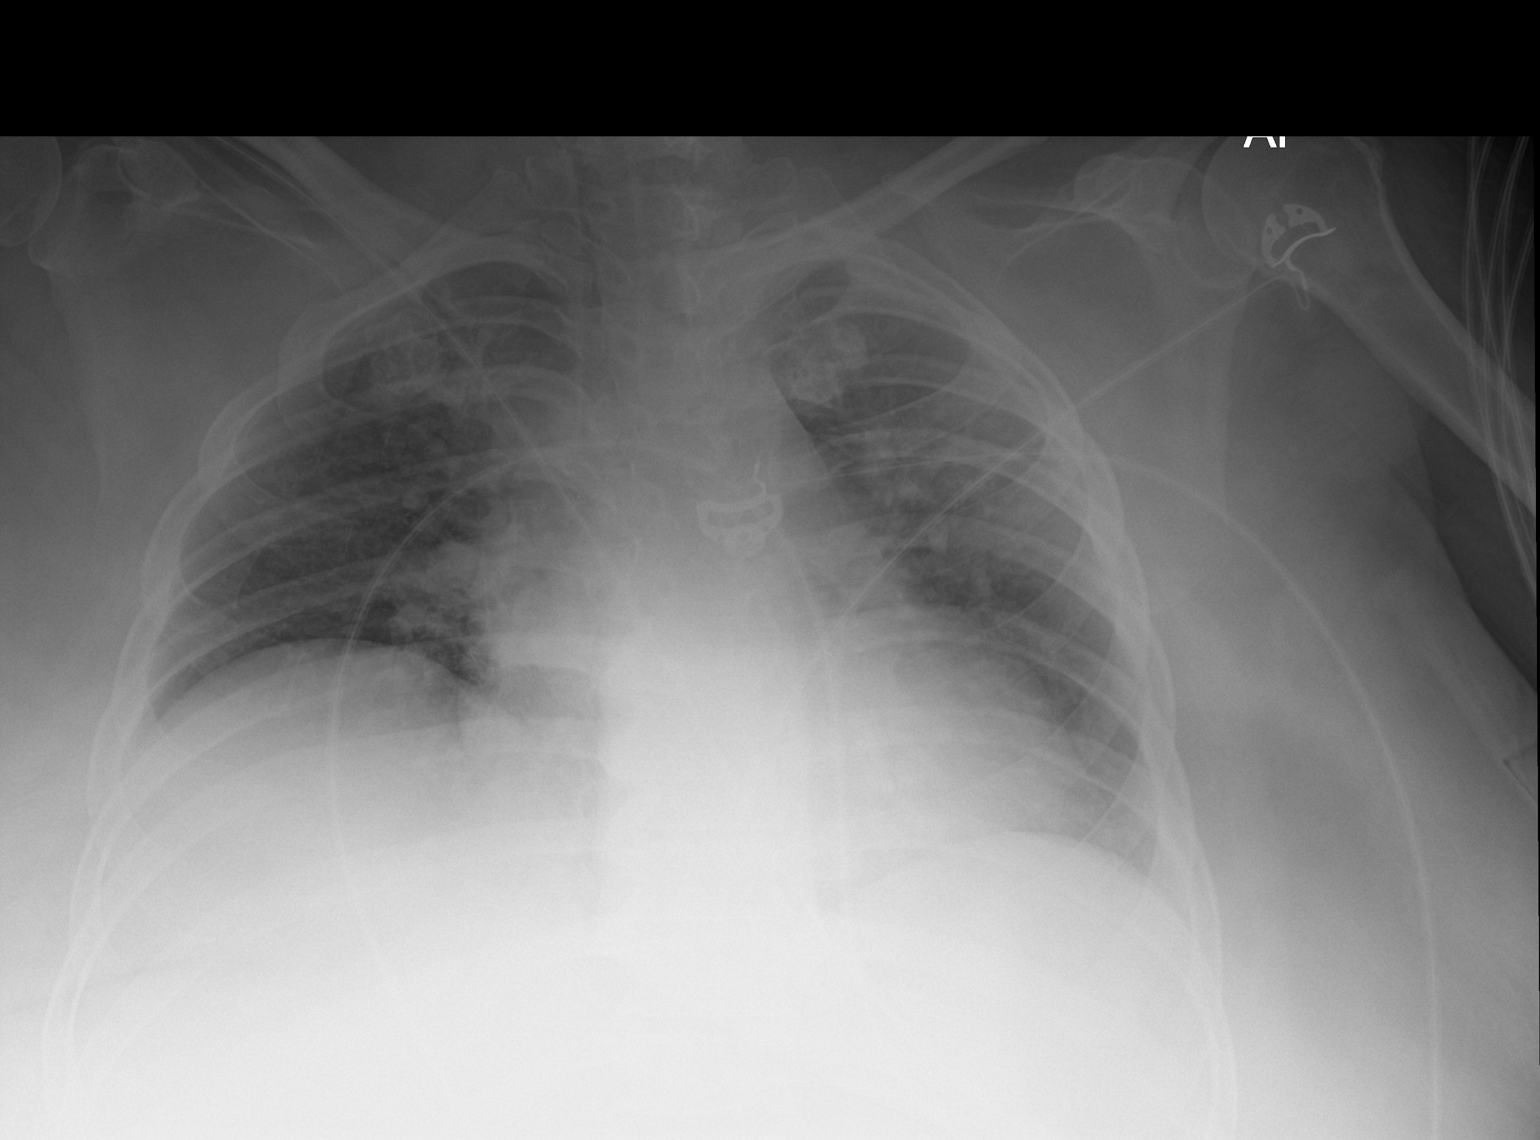

[1 of 1 positions shown; findings below may reference images not displayed]

FINDINGS: The lungs are hypoexpanded. Vascular crowding and vascular
congestion are noted. There is no evidence of focal opacification,
pleural effusion or pneumothorax.

The cardiomediastinal silhouette is mildly enlarged. No acute
osseous abnormalities are seen.
IMPRESSION: Lungs hypoexpanded. Vascular congestion and mild cardiomegaly. No
definite focal airspace consolidation seen.

## 2017-12-14 ENCOUNTER — Ambulatory Visit: Payer: Medicaid Other | Admitting: Podiatry

## 2017-12-15 ENCOUNTER — Ambulatory Visit: Payer: Self-pay | Admitting: Pharmacist

## 2017-12-15 ENCOUNTER — Telehealth: Payer: Self-pay | Admitting: *Deleted

## 2017-12-15 NOTE — Telephone Encounter (Signed)
Completed PA info in [place] for victoza.  Status pending.  Will recheck status in 24 hours. [place], Salome Spotted, CMA

## 2017-12-16 ENCOUNTER — Encounter: Payer: Medicaid Other | Attending: Physician Assistant | Admitting: Physician Assistant

## 2017-12-16 DIAGNOSIS — E1140 Type 2 diabetes mellitus with diabetic neuropathy, unspecified: Secondary | ICD-10-CM | POA: Diagnosis not present

## 2017-12-16 DIAGNOSIS — I10 Essential (primary) hypertension: Secondary | ICD-10-CM | POA: Insufficient documentation

## 2017-12-16 DIAGNOSIS — E11621 Type 2 diabetes mellitus with foot ulcer: Secondary | ICD-10-CM | POA: Insufficient documentation

## 2017-12-16 DIAGNOSIS — Z87891 Personal history of nicotine dependence: Secondary | ICD-10-CM | POA: Insufficient documentation

## 2017-12-16 DIAGNOSIS — L97422 Non-pressure chronic ulcer of left heel and midfoot with fat layer exposed: Secondary | ICD-10-CM | POA: Diagnosis not present

## 2017-12-16 DIAGNOSIS — J45909 Unspecified asthma, uncomplicated: Secondary | ICD-10-CM | POA: Diagnosis not present

## 2017-12-16 DIAGNOSIS — Z794 Long term (current) use of insulin: Secondary | ICD-10-CM | POA: Diagnosis not present

## 2017-12-16 DIAGNOSIS — M1990 Unspecified osteoarthritis, unspecified site: Secondary | ICD-10-CM | POA: Diagnosis not present

## 2017-12-18 NOTE — Progress Notes (Signed)
[name], [name] ([id number]) Visit Report for 12/16/2017 Chief Complaint Document Details Patient Name: [name], [name] Date of Service: 12/16/2017 8:00 AM Medical Record Number: [id number] Patient Account Number: [id number] Date of Birth/Sex: Aug 06, 1976 (42 y.o. Female) Treating RN: Montey Hora Primary Care Provider: [name] Other Clinician: Referring Provider: [name] Treating Provider/Extender: Melburn Hake, HOYT Weeks in Treatment: 0 Information Obtained from: Patient Chief Complaint Left heel ulcer Electronic Signature(s) Signed: 12/18/2017 5:30:41 PM By: Worthy Keeler PA-C Entered By: Worthy Keeler on 12/16/2017 11:35:03 [name] ([id number]) -------------------------------------------------------------------------------- Debridement Details Patient Name: [name] Date of Service: 12/16/2017 8:00 AM Medical Record Number: [id number] Patient Account Number: [id number] Date of Birth/Sex: Mar 20, 1976 (41 y.o. Female) Treating RN: Montey Hora Primary Care Provider: [name] Other Clinician: Referring Provider: [name] Treating Provider/Extender: Melburn Hake, HOYT Weeks in Treatment: 0 Debridement Performed for Wound #1 Left Calcaneus Assessment: Performed By: Physician STONE III, HOYT E., PA-C Debridement: Debridement Severity of Tissue Pre Fat layer exposed Debridement: Pre-procedure Verification/Time Yes - 09:14 Out Taken: Start Time: 09:14 Pain Control: Lidocaine 4% Topical Solution Level: Skin/Subcutaneous Tissue Total Area Debrided (L x W): 0.8 (cm) x 0.3 (cm) = 0.24 (cm) Tissue and other material Viable, Non-Viable, Callus, Fibrin/Slough, Subcutaneous debrided: Instrument: Curette Bleeding: Minimum Hemostasis Achieved: Pressure End Time: 09:17 Procedural Pain: 0 Post Procedural Pain: 0 Response to Treatment: Procedure was tolerated well Post Debridement Measurements of Total Wound Length: (cm) 0.9 Width: (cm)  0.5 Depth: (cm) 0.8 Volume: (cm) 0.283 Character of Wound/Ulcer Post Debridement: Improved Severity of Tissue Post Debridement: Fat layer exposed Post Procedure Diagnosis Same as Pre-procedure Electronic Signature(s) Signed: 12/16/2017 4:38:28 PM By: Montey Hora Signed: 12/18/2017 5:30:41 PM By: Worthy Keeler PA-C Entered By: Montey Hora on 12/16/2017 09:17:01 [name] ([id number]) -------------------------------------------------------------------------------- HPI Details Patient Name: [name] Date of Service: 12/16/2017 8:00 AM Medical Record Number: [id number] Patient Account Number: [id number] Date of Birth/Sex: 1976/10/14 (42 y.o. Female) Treating RN: Montey Hora Primary Care Provider: [name] Other Clinician: Referring Provider: [name] Treating Provider/Extender: Melburn Hake, HOYT Weeks in Treatment: 0 History of Present Illness Associated Signs and Symptoms: Patient has a history of diabetes mellitus type II, diabetic neuropathy, and hypertension. HPI Description: 12/16/17 patient presents today for evaluation concerning her left heel ulcer which has been present since June 2018. This occurred after she was outside knowing in her shoes with no socks. At this point in time she has been on Levaquin since November due to a significant infection which did wind up with her in the hospital. She is diabetic and unfortunately her diabetes is not well controlled with a hemoglobin A-1 C of 13.0 last evaluated on 10/29/17. Patient is also medication for high blood pressure. She did have an MRI of her left ankle with and without contrast which was performed on 10/29/17 and this showed skin ulcerations on the field with mild appearing underlying cellulitis. At that point it was negative for abscess, osteomyelitis, or septic joint. There was also mild appearing plantar fasciitis noted. Patient notes that this is a very tender spot on her heel at this point.  Fortunately she does not seem to be having any significant sign of infection at this point she is still on the Levaquin. She has previously been treated by her podiatrist. Her pain is rated to be a 6-7/10 worse at times with palpation or cleansing over the area. Electronic Signature(s) Signed: 12/18/2017 5:30:41 PM By: Worthy Keeler PA-C Entered By: Worthy Keeler on 12/16/2017 11:38:15 [name],  [name] ([id number]) -------------------------------------------------------------------------------- Physical Exam Details Patient Name: [name], [name] Date of Service: 12/16/2017 8:00 AM Medical Record Number: [id number] Patient Account Number: [id number] Date of Birth/Sex: January 22, 1976 (42 y.o. Female) Treating RN: Montey Hora Primary Care Provider: [name] Other Clinician: Referring Provider: [name] Treating Provider/Extender: Melburn Hake, HOYT Weeks in Treatment: 0 Constitutional sitting or standing blood pressure is within target range for patient.. pulse regular and within target range for patient.Marland Kitchen respirations regular, non-labored and within target range for patient.Marland Kitchen temperature within target range for patient.. Well- nourished and well-hydrated in no acute distress. Eyes conjunctiva clear no eyelid edema noted. pupils equal round and reactive to light and accommodation. Ears, Nose, Mouth, and Throat no gross abnormality of ear auricles or external auditory canals. normal hearing noted during conversation. mucus membranes moist. Respiratory normal breathing without difficulty. clear to auscultation bilaterally. Cardiovascular regular rate and rhythm with normal S1, S2. 1+ dorsalis pedis/posterior tibialis pulses. 1+ pitting edema of the bilateral lower extremities. Gastrointestinal (GI) soft, non-tender, non-distended, +BS. no ventral hernia noted. Musculoskeletal normal gait and posture. no significant deformity or arthritic changes, no loss or range of motion, no  clubbing. Psychiatric this patient is able to make decisions and demonstrates good insight into disease process. Alert and Oriented x 3. pleasant and cooperative. Notes Patient's wound bed does showed there is some depth to the wound and she actually does have slough noted covering the surface of the wound. There is some callous also surrounding the wound bed. This did require sharp debridement today and patient tolerated this without complication other than having some discomfort although she was able to deal with it and post debridement the wound look significantly better. There is no evidence of infection and bone is not palpable. Electronic Signature(s) Signed: 12/18/2017 5:30:41 PM By: Worthy Keeler PA-C Entered By: Worthy Keeler on 12/16/2017 11:39:45 [name] ([id number]) -------------------------------------------------------------------------------- Physician Orders Details Patient Name: [name] Date of Service: 12/16/2017 8:00 AM Medical Record Number: [id number] Patient Account Number: [id number] Date of Birth/Sex: 1976/07/29 (41 y.o. Female) Treating RN: Montey Hora Primary Care Provider: [name] Other Clinician: Referring Provider: [name] Treating Provider/Extender: Melburn Hake, HOYT Weeks in Treatment: 0 Verbal / Phone Orders: No Diagnosis Coding ICD-10 Coding Code Description E11.621 Type 2 diabetes mellitus with foot ulcer 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 layer exposed I10 Essential (primary) hypertension E11.40 Type 2 diabetes mellitus with diabetic neuropathy, unspecified Wound Cleansing Wound #1 Left Calcaneus o Cleanse wound with mild soap and water o May Shower, gently pat wound dry prior to applying new dressing. Anesthetic (add to Medication List) Wound #1 Left Calcaneus o Topical Lidocaine 4% cream applied to wound bed prior to debridement (In Clinic Only). Primary Wound  Dressing Wound #1 Left Calcaneus o Other: - endoform Secondary Dressing Wound #1 Left Calcaneus o Dry Gauze o Boardered Foam Dressing - or telfa island dressing Dressing Change Frequency Wound #1 Left Calcaneus o Change dressing every other day. Follow-up Appointments Wound #1 Left Calcaneus o Return Appointment in 1 week. Edema Control Wound #1 Left Calcaneus o Elevate legs to the level of the heart and pump ankles as often as possible Additional Orders / Instructions Wound #1 Left Calcaneus o Vitamin A; Vitamin C, Zinc - Please take these over the counter supplements [name], [name] ([id number]) o Increase protein intake. Patient Medications Allergies: Nubain Notifications Medication Indication Start End lidocaine 12/16/2017 DOSE topical 4 % cream - cream topical Electronic Signature(s) Signed: 12/16/2017 4:38:28  PM By: Montey Hora Signed: 12/18/2017 5:30:41 PM By: Worthy Keeler PA-C Entered By: Montey Hora on 12/16/2017 09:19:39 [name] ([id number]) -------------------------------------------------------------------------------- Problem List Details Patient Name: [name] Date of Service: 12/16/2017 8:00 AM Medical Record Number: [id number] Patient Account Number: [id number] Date of Birth/Sex: 03/13/1976 (41 y.o. Female) Treating RN: Montey Hora Primary Care Provider: [name] Other Clinician: Referring Provider: [name] Treating Provider/Extender: Melburn Hake, HOYT Weeks in Treatment: 0 Active Problems ICD-10 Encounter Code Description Active Date Diagnosis E11.621 Type 2 diabetes mellitus with foot ulcer 12/16/2017 Yes 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 layer 12/16/2017 Yes exposed [place] (primary) hypertension 12/16/2017 Yes E11.40 Type 2 diabetes mellitus with diabetic neuropathy, unspecified 12/16/2017 Yes Inactive Problems Resolved Problems Electronic Signature(s) Signed:  12/18/2017 5:30:41 PM By: Worthy Keeler PA-C Entered By: Worthy Keeler on 12/16/2017 09:05:43 [name] ([id number]) -------------------------------------------------------------------------------- Progress Note Details Patient Name: [name] Date of Service: 12/16/2017 8:00 AM Medical Record Number: [id number] Patient Account Number: [id number] Date of Birth/Sex: [birth year] (41 y.o. Female) Treating RN: Montey Hora Primary Care Provider: [name] Other Clinician: Referring Provider: [name] Treating Provider/Extender: Melburn Hake, HOYT Weeks in Treatment: 0 Subjective Chief Complaint Information obtained from Patient Left heel ulcer History of Present Illness (HPI) The following HPI elements were documented for the patient's wound: Associated Signs and Symptoms: Patient has a history of diabetes mellitus type II, diabetic neuropathy, and hypertension. 12/16/17 patient presents today for evaluation concerning her left heel ulcer which has been present since June 2018. This occurred after she was outside knowing in her shoes with no socks. At this point in time she has been on Levaquin since November due to a significant infection which did wind up with her in the hospital. She is diabetic and unfortunately her diabetes is not well controlled with a hemoglobin A-1 C of 13.0 last evaluated on 10/29/17. Patient is also medication for high blood pressure. She did have an MRI of her left ankle with and without contrast which was performed on 10/29/17 and this showed skin ulcerations on the field with mild appearing underlying cellulitis. At that point it was negative for abscess, osteomyelitis, or septic joint. There was also mild appearing plantar fasciitis noted. Patient notes that this is a very tender spot on her heel at this point. Fortunately she does not seem to be having any significant sign of infection at this point she is still on the Levaquin. She has  previously been treated by her podiatrist. Her pain is rated to be a 6-7/10 worse at times with palpation or cleansing over the area. Wound History Patient presents with 1 open wound that has been present for approximately 2 months. Patient has been treating wound in the following manner: bandaids. Laboratory tests have not been performed in the last month. Patient reportedly has not tested positive for an antibiotic resistant organism. Patient reportedly has not tested positive for osteomyelitis. Patient reportedly has not had testing performed to evaluate circulation in the legs. Patient History Information obtained from Patient. Allergies Nubain Family History Diabetes - Mother, Heart Disease - Father, Hypertension - Father, Lung Disease - Siblings, No family history of Cancer, Hereditary Spherocytosis, Kidney Disease, Seizures, Stroke, Thyroid Problems, Tuberculosis. Social History Former smoker - quit 18 years ago, Marital Status - Married, Alcohol Use - Never, Drug Use - No History, Caffeine Use - Moderate. Medical History Hematologic/Lymphatic Denies history of Anemia, Hemophilia, Human Immunodeficiency Virus, Lymphedema, Sickle Cell Disease Respiratory Patient  has history of Asthma [name], [name] ([id number]) Denies history of Chronic Obstructive Pulmonary Disease (COPD) Cardiovascular Patient has history of Hypertension Denies history of Angina, Arrhythmia, Congestive Heart Failure, Coronary Artery Disease, Deep Vein Thrombosis, Hypotension, Myocardial Infarction, Peripheral Arterial Disease, Peripheral Venous Disease, Phlebitis, Vasculitis Gastrointestinal Denies history of Cirrhosis , Colitis, Crohn s, Hepatitis A, Hepatitis B, Hepatitis C Endocrine Patient has history of Type II Diabetes Genitourinary Denies history of End Stage Renal Disease Immunological Denies history of Lupus Erythematosus, Raynaud s, Scleroderma Musculoskeletal Patient has history of  Osteoarthritis Denies history of Gout, Rheumatoid Arthritis, Osteomyelitis Neurologic Patient has history of Neuropathy Oncologic Denies history of Received Chemotherapy, Received Radiation Patient is treated with Insulin. Blood sugar is tested. Review of Systems (ROS) Constitutional Symptoms (General Health) The patient has no complaints or symptoms. Eyes The patient has no complaints or symptoms. Ear/Nose/Mouth/Throat The patient has no complaints or symptoms. Hematologic/Lymphatic The patient has no complaints or symptoms. Cardiovascular The patient has no complaints or symptoms. Gastrointestinal The patient has no complaints or symptoms. Endocrine The patient has no complaints or symptoms. Genitourinary The patient has no complaints or symptoms. Immunological The patient has no complaints or symptoms. Integumentary (Skin) The patient has no complaints or symptoms. Musculoskeletal The patient has no complaints or symptoms. Neurologic The patient has no complaints or symptoms. Oncologic The patient has no complaints or symptoms. [name], [name] ([id number]) Objective Constitutional sitting or standing blood pressure is within target range for patient.. pulse regular and within target range for patient.Marland Kitchen respirations regular, non-labored and within target range for patient.Marland Kitchen temperature within target range for patient.. Well- nourished and well-hydrated in no acute distress. Vitals Time Taken: 8:18 AM, Height: 61 in, Source: Measured, Weight: 304 lbs, Source: Measured, BMI: 57.4, Temperature: 98.0 F, Pulse: 87 bpm, Respiratory Rate: 18 breaths/min, Blood Pressure: 115/75 mmHg. Eyes conjunctiva clear no eyelid edema noted. pupils equal round and reactive to light and accommodation. Ears, Nose, Mouth, and Throat no gross abnormality of ear auricles or external auditory canals. normal hearing noted during conversation. mucus membranes moist. Respiratory normal breathing  without difficulty. clear to auscultation bilaterally. Cardiovascular regular rate and rhythm with normal S1, S2. 1+ dorsalis pedis/posterior tibialis pulses. 1+ pitting edema of the bilateral lower extremities. Gastrointestinal (GI) soft, non-tender, non-distended, +BS. no ventral hernia noted. Musculoskeletal normal gait and posture. no significant deformity or arthritic changes, no loss or range of motion, no clubbing. Psychiatric this patient is able to make decisions and demonstrates good insight into disease process. Alert and Oriented x 3. pleasant and cooperative. General Notes: Patient's wound bed does showed there is some depth to the wound and she actually does have slough noted covering the surface of the wound. There is some callous also surrounding the wound bed. This did require sharp debridement today and patient tolerated this without complication other than having some discomfort although she was able to deal with it and post debridement the wound look significantly better. There is no evidence of infection and bone is not palpable. Integumentary (Hair, Skin) Wound #1 status is Open. Original cause of wound was Blister. The wound is located on the Left Calcaneus. The wound measures 0.8cm length x 0.3cm width x 0.5cm depth; 0.188cm^2 area and 0.094cm^3 volume. There is Fat Layer (Subcutaneous Tissue) Exposed exposed. There is no tunneling or undermining noted. There is a medium amount of serous drainage noted. The wound margin is flat and intact. There is medium (34-66%) pink granulation within the wound bed. There is a medium (34-66%)  amount of necrotic tissue within the wound bed including Adherent Slough. The periwound skin appearance exhibited: Callus. The periwound skin appearance did not exhibit: Crepitus, Excoriation, Induration, Rash, Scarring, Dry/Scaly, Maceration, Atrophie Blanche, Cyanosis, Ecchymosis, Hemosiderin Staining, Mottled, Pallor, Rubor, Erythema.  Periwound temperature was noted as No Abnormality. The periwound has tenderness on palpation. Assessment [name], [name] ([id number]) Active Problems ICD-10 E11.621 - Type 2 diabetes mellitus with foot ulcer L97.422 - Non-pressure chronic ulcer of left heel and midfoot with fat layer exposed I10 - Essential (primary) hypertension E11.40 - Type 2 diabetes mellitus with diabetic neuropathy, unspecified Procedures Wound #1 Pre-procedure diagnosis of Wound #1 is a Diabetic Wound/Ulcer of the Lower Extremity located on the Left Calcaneus .Severity of Tissue Pre Debridement is: Fat layer exposed. There was a Skin/Subcutaneous Tissue Debridement ([id number]) debridement with total area of 0.24 sq cm performed by STONE III, HOYT E., PA-C. with the following instrument(s): Curette to remove Viable and Non-Viable tissue/material including Fibrin/Slough, Callus, and Subcutaneous after achieving pain control using Lidocaine 4% Topical Solution. A time out was conducted at 09:14, prior to the start of the procedure. A Minimum amount of bleeding was controlled with Pressure. The procedure was tolerated well with a pain level of 0 throughout and a pain level of 0 following the procedure. Post Debridement Measurements: 0.9cm length x 0.5cm width x 0.8cm depth; 0.283cm^3 volume. Character of Wound/Ulcer Post Debridement is improved. Severity of Tissue Post Debridement is: Fat layer exposed. Post procedure Diagnosis Wound #1: Same as Pre-Procedure Plan Wound Cleansing: Wound #1 Left Calcaneus: Cleanse wound with mild soap and water May Shower, gently pat wound dry prior to applying new dressing. Anesthetic (add to Medication List): Wound #1 Left Calcaneus: Topical Lidocaine 4% cream applied to wound bed prior to debridement (In Clinic Only). Primary Wound Dressing: Wound #1 Left Calcaneus: Other: - endoform Secondary Dressing: Wound #1 Left Calcaneus: Dry Gauze Boardered Foam Dressing - or telfa island  dressing Dressing Change Frequency: Wound #1 Left Calcaneus: Change dressing every other day. Follow-up Appointments: Wound #1 Left Calcaneus: Return Appointment in 1 week. Edema Control: Wound #1 Left Calcaneus: Elevate legs to the level of the heart and pump ankles as often as possible Additional Orders / InstructionsCHASITTY, [name] ([id number]) Wound #1 Left Calcaneus: Vitamin A; Vitamin C, Zinc - Please take these over the counter supplements Increase protein intake. The following medication(s) was prescribed: lidocaine topical 4 % cream cream topical was prescribed at facility This point I'm going to recommend a few things for this patient. Number one I do think it will be helpful for her to have an offloading shoe and this was provided today. Hopefully that will help prevent any additional injury to the heel while it heals. Subsequently I'm also going to suggest that we initiate treatment with Endoform at this point. Hopefully this will help to fill in the wound. I do think she could be a candidate for dermagraft in the future depending on how things proceed. I would however like to see how she responds to the Endoform after cleaning this out. She is in agreement with the plan as it is stated today. Please see above for specific wound care orders. We will see patient for re-evaluation in 1 week(s) here in the clinic. If anything worsens or changes patient will contact our office for additional recommendations. Electronic Signature(s) Signed: 12/18/2017 5:30:41 PM By: Worthy Keeler PA-C Entered By: Worthy Keeler on 12/16/2017 11:41:38 [name] ([id number]) -------------------------------------------------------------------------------- ROS/PFSH Details Patient Name: [name]  Date of Service: 12/16/2017 8:00 AM Medical Record Number: [id number] Patient Account Number: [id number] Date of Birth/Sex: 11/06/1976 (42 y.o. Female) Treating RN: Montey Hora Primary Care  Provider: [name] Other Clinician: Referring Provider: [name] Treating Provider/Extender: Melburn Hake, HOYT Weeks in Treatment: 0 Information Obtained From Patient Wound History Do you currently have one or more open woundso Yes How many open wounds do you currently haveo 1 Approximately how long have you had your woundso 2 months How have you been treating your wound(s) until nowo bandaids Has your wound(s) ever healed and then re-openedo No Have you had any lab work done in the past montho No Have you tested positive for an antibiotic resistant organism (MRSA, VRE)o No Have you tested positive for osteomyelitis (bone infection)o No Have you had any tests for circulation on your legso No Genitourinary Complaints and Symptoms: No Complaints or Symptoms Complaints and Symptoms: Negative for: Kidney failure/ Dialysis; Incontinence/dribbling Medical History: Negative for: End Stage Renal Disease Constitutional Symptoms (General Health) Complaints and Symptoms: No Complaints or Symptoms Eyes Complaints and Symptoms: No Complaints or Symptoms Ear/Nose/Mouth/Throat Complaints and Symptoms: No Complaints or Symptoms Hematologic/Lymphatic Complaints and Symptoms: No Complaints or Symptoms Medical History: Negative for: Anemia; Hemophilia; Human Immunodeficiency Virus; Lymphedema; Sickle Cell Disease Respiratory [name], [name] ([id number]) Medical History: Positive for: Asthma Negative for: Chronic Obstructive Pulmonary Disease (COPD) Cardiovascular Complaints and Symptoms: No Complaints or Symptoms Medical History: Positive for: Hypertension Negative for: Angina; Arrhythmia; Congestive Heart Failure; Coronary Artery Disease; Deep Vein Thrombosis; Hypotension; Myocardial Infarction; Peripheral Arterial Disease; Peripheral Venous Disease; Phlebitis; Vasculitis Gastrointestinal Complaints and Symptoms: No Complaints or Symptoms Medical History: Negative  for: Cirrhosis ; Colitis; Crohnos; Hepatitis A; Hepatitis B; Hepatitis C Endocrine Complaints and Symptoms: No Complaints or Symptoms Medical History: Positive for: Type II Diabetes Treated with: Insulin Blood sugar tested every day: Yes Tested : 3-4 times daily Immunological Complaints and Symptoms: No Complaints or Symptoms Medical History: Negative for: Lupus Erythematosus; Raynaudos; Scleroderma Integumentary (Skin) Complaints and Symptoms: No Complaints or Symptoms Musculoskeletal Complaints and Symptoms: No Complaints or Symptoms Medical History: Positive for: Osteoarthritis Negative for: Gout; Rheumatoid Arthritis; Osteomyelitis Neurologic Complaints and Symptoms: No Complaints or Symptoms [name], [name] ([id number]) Medical History: Positive for: Neuropathy Oncologic Complaints and Symptoms: No Complaints or Symptoms Medical History: Negative for: Received Chemotherapy; Received Radiation Immunizations Pneumococcal Vaccine: Received Pneumococcal Vaccination: No Implantable Devices Family and Social History Cancer: No; Diabetes: Yes - Mother; Heart Disease: Yes - Father; Hereditary Spherocytosis: No; Hypertension: Yes - Father; Kidney Disease: No; Lung Disease: Yes - Siblings; Seizures: No; Stroke: No; Thyroid Problems: No; Tuberculosis: No; Former smoker - quit 18 years ago; Marital Status - Married; Alcohol Use: Never; Drug Use: No History; Caffeine Use: Moderate; Financial Concerns: No; Food, Clothing or Shelter Needs: No; Support System Lacking: No; Transportation Concerns: No; Advanced Directives: No; Patient does not want information on Advanced Directives Electronic Signature(s) Signed: 12/16/2017 4:38:28 PM By: Montey Hora Signed: 12/18/2017 5:30:41 PM By: Worthy Keeler PA-C Entered By: Montey Hora on 12/16/2017 08:26:27 [name] ([id number]) -------------------------------------------------------------------------------- SuperBill  Details Patient Name: [name] Date of Service: 12/16/2017 Medical Record Number: [id number] Patient Account Number: [id number] Date of Birth/Sex: 14-Oct-1976 (42 y.o. Female) Treating RN: Montey Hora Primary Care Provider: [name] Other Clinician: Referring Provider: [name] Treating Provider/Extender: Melburn Hake, HOYT Weeks in Treatment: 0 Diagnosis Coding ICD-10 Codes Code Description E11.621 Type 2 diabetes mellitus with foot ulcer 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 layer exposed  I10 Essential (primary) hypertension E11.40 Type 2 diabetes mellitus with diabetic neuropathy, unspecified Facility Procedures CPT4 Code: 96295284 Description: 13244 - WOUND CARE VISIT-LEV 3 EST PT Modifier: Quantity: 1 CPT4 Code: [id number] Description: 66440 - DEB SUBQ TISSUE 20 SQ CM/< ICD-10 Diagnosis Description 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 Modifier: layer exposed Quantity: 1 Physician Procedures CPT4 Code: 3474259 Description: 56387 - WC PHYS LEVEL 4 - NEW PT ICD-10 Diagnosis Description E11.621 Type 2 diabetes mellitus with foot ulcer 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

## 2017-12-18 NOTE — Progress Notes (Signed)
ADLER, ALTON ([id number]) Visit Report for 12/16/2017 Abuse/Suicide Risk Screen Details Patient Name: [name], [name] Date of Service: 12/16/2017 8:00 AM Medical Record Number: [id number] Patient Account Number: [id number] Date of Birth/Sex: 1976-10-07 (42 y.o. Female) Treating RN: Montey Hora Primary Care Carlton Sweaney: [name] Other Clinician: Referring Kalicia Dufresne: [name] Treating Lennell Shanks/Extender: Melburn Hake, HOYT Weeks in Treatment: 0 Abuse/Suicide Risk Screen Items Answer ABUSE/SUICIDE RISK SCREEN: Has anyone close to you tried to hurt or harm you recentlyo No Do you feel uncomfortable with anyone in your familyo No Has anyone forced you do things that you didnot want to doo No Do you have any thoughts of harming yourselfo No Patient displays signs or symptoms of abuse and/or neglect. No Electronic Signature(s) Signed: 12/16/2017 4:38:28 PM By: Montey Hora Entered By: Montey Hora on 12/16/2017 08:20:53 [name] ([id number]) -------------------------------------------------------------------------------- Activities of Daily Living Details Patient Name: [name] Date of Service: 12/16/2017 8:00 AM Medical Record Number: [id number] Patient Account Number: [id number] Date of Birth/Sex: 1976/08/12 (42 y.o. Female) Treating RN: Montey Hora Primary Care Ayda Tancredi: [name] Other Clinician: Referring Shalen Petrak: [name] Treating Diandra Cimini/Extender: Melburn Hake, HOYT Weeks in Treatment: 0 Activities of Daily Living Items Answer Activities of Daily Living (Please select one for each item) Drive Automobile Completely Able Take Medications Completely Able Use Telephone Completely Able Care for Appearance Completely Able Use Toilet Completely Able Bath / Shower Completely Able Dress Self Completely Able Feed Self Completely Able Walk Completely Able Get In / Out Bed Completely Able Housework Completely Able Prepare Meals Completely  [place] for Self Completely Able Electronic Signature(s) Signed: 12/16/2017 4:38:28 PM By: Montey Hora Entered By: Montey Hora on 12/16/2017 08:21:13 [name] ([id number]) -------------------------------------------------------------------------------- Education Assessment Details Patient Name: [name] Date of Service: 12/16/2017 8:00 AM Medical Record Number: [id number] Patient Account Number: [id number] Date of Birth/Sex: 01-30-1976 (41 y.o. Female) Treating RN: Montey Hora Primary Care Arsalan Brisbin: [name] Other Clinician: Referring Yu Cragun: [name] Treating Glyndon Tursi/Extender: Sharalyn Ink in Treatment: 0 Primary Learner Assessed: Patient Learning Preferences/Education Level/Primary Language Learning Preference: Explanation, Demonstration Highest Education Level: High School Preferred Language: English Cognitive Barrier Assessment/Beliefs Language Barrier: No Translator Needed: No Memory Deficit: No Emotional Barrier: No Cultural/Religious Beliefs Affecting Medical Care: No Physical Barrier Assessment Impaired Vision: No Impaired Hearing: No Decreased Hand dexterity: No Knowledge/Comprehension Assessment Knowledge Level: Medium Comprehension Level: Medium Ability to understand written Medium instructions: Ability to understand verbal Medium instructions: Motivation Assessment Anxiety Level: Calm Cooperation: Cooperative Education Importance: Acknowledges Need Interest in Health Problems: Asks Questions Perception: Coherent Willingness to Engage in Self- Medium Management Activities: Readiness to Engage in Self- Medium Management Activities: Electronic Signature(s) Signed: 12/16/2017 4:38:28 PM By: Montey Hora Entered By: Montey Hora on 12/16/2017 08:21:36 [name] ([id number]) -------------------------------------------------------------------------------- Fall Risk Assessment  Details Patient Name: [name] Date of Service: 12/16/2017 8:00 AM Medical Record Number: [id number] Patient Account Number: [id number] Date of Birth/Sex: [birth year] (42 y.o. Female) Treating RN: Montey Hora Primary Care Loza Prell: [name] Other Clinician: Referring Gatlin Kittell: [name] Treating Cherryl Babin/Extender: Melburn Hake, HOYT Weeks in Treatment: 0 Fall Risk Assessment Items Have you had 2 or more falls in the last 12 monthso 0 No Have you had any fall that resulted in injury in the last 12 monthso 0 No FALL RISK ASSESSMENT: History of falling - immediate or within 3 months 0 No Secondary diagnosis 0 No Ambulatory aid None/bed rest/wheelchair/nurse 0 Yes Crutches/cane/walker 0 No Furniture 0 No IV Access/Saline Lock 0 No Gait/Training Normal/bed  rest/immobile 0 No Weak 10 Yes Impaired 0 No Mental Status Oriented to own ability 0 Yes Electronic Signature(s) Signed: 12/16/2017 4:38:28 PM By: Montey Hora Entered By: Montey Hora on 12/16/2017 08:21:48 [name] ([id number]) -------------------------------------------------------------------------------- Foot Assessment Details Patient Name: [name] Date of Service: 12/16/2017 8:00 AM Medical Record Number: [id number] Patient Account Number: [id number] Date of Birth/Sex: 30-May-1976 (42 y.o. Female) Treating RN: Montey Hora Primary Care Audree Schrecengost: [name] Other Clinician: Referring Endrit Gittins: [name] Treating Filip Luten/Extender: Melburn Hake, HOYT Weeks in Treatment: 0 Foot Assessment Items Site Locations + = Sensation present, - = Sensation absent, C = Callus, U = Ulcer R = Redness, W = Warmth, M = Maceration, PU = Pre-ulcerative lesion F = Fissure, S = Swelling, D = Dryness Assessment Right: Left: Other Deformity: No No Prior Foot Ulcer: No No Prior Amputation: No No Charcot Joint: No No Ambulatory Status: Ambulatory Without Help Gait: Steady Electronic  Signature(s) Signed: 12/16/2017 4:38:28 PM By: Montey Hora Entered By: Montey Hora on 12/16/2017 08:36:07 [name] ([id number]) -------------------------------------------------------------------------------- Nutrition Risk Assessment Details Patient Name: [name] Date of Service: 12/16/2017 8:00 AM Medical Record Number: [id number] Patient Account Number: [id number] Date of Birth/Sex: 1976/05/17 (42 y.o. Female) Treating RN: Montey Hora Primary Care Brienne Liguori: [name] Other Clinician: Referring Trevelle Mcgurn: [name] Treating Anavi Branscum/Extender: Melburn Hake, HOYT Weeks in Treatment: 0 Height (in): 61 Weight (lbs): 304 Body Mass Index (BMI): 57.4 Nutrition Risk Assessment Items NUTRITION RISK SCREEN: I have an illness or condition that made me change the kind and/or amount of 0 No food I eat I eat fewer than two meals per day 0 No I eat few fruits and vegetables, or milk products 0 No I have three or more drinks of beer, liquor or wine almost every day 0 No I have tooth or mouth problems that make it hard for me to eat 0 No I don't always have enough money to buy the food I need 0 No I eat alone most of the time 0 No I take three or more different prescribed or over-the-counter drugs a day 1 Yes Without wanting to, I have lost or gained 10 pounds in the last six months 0 No I am not always physically able to shop, cook and/or feed myself 0 No Nutrition Protocols Good Risk Protocol 0 No interventions needed Moderate Risk Protocol Electronic Signature(s) Signed: 12/16/2017 4:38:28 PM By: Montey Hora Entered By: Montey Hora on 12/16/2017 08:21:58

## 2017-12-18 NOTE — Progress Notes (Signed)
SYRIAH, DELISI ([id number]) Visit Report for 12/16/2017 Allergy List Details Patient Name: RISE, TRAEGER Date of Service: 12/16/2017 8:00 AM Medical Record Number: [id number] Patient Account Number: [id number] Date of Birth/Sex: 11/28/1976 (42 y.o. Female) Treating RN: Montey Hora Primary Care Marialice Newkirk: [name] Other Clinician: Referring Silus Lanzo: [name] Treating Aniel Hubble/Extender: Melburn Hake, HOYT Weeks in Treatment: 0 Allergies Active Allergies Nubain Allergy Notes Electronic Signature(s) Signed: 12/16/2017 4:38:28 PM By: Montey Hora Entered By: Montey Hora on 12/16/2017 08:20:46 [name] ([id number]) -------------------------------------------------------------------------------- Arrival Information Details Patient Name: [name] Date of Service: 12/16/2017 8:00 AM Medical Record Number: [id number] Patient Account Number: [id number] Date of Birth/Sex: Feb 10, 1976 (42 y.o. Female) Treating RN: Montey Hora Primary Care Ruthann Angulo: [name] Other Clinician: Referring Saramarie Stinger: [name] Treating Joanette Silveria/Extender: Melburn Hake, HOYT Weeks in Treatment: 0 Visit Information Patient Arrived: Ambulatory Arrival Time: 08:16 Accompanied By: self Transfer Assistance: None Patient Identification Verified: Yes Secondary Verification Process Completed: Yes Patient Has Alerts: Yes Patient Alerts: DMII Electronic Signature(s) Signed: 12/16/2017 4:38:28 PM By: Montey Hora Entered By: Montey Hora on 12/16/2017 08:16:35 [name] ([id number]) -------------------------------------------------------------------------------- Clinic Level of Care Assessment Details Patient Name: [name] Date of Service: 12/16/2017 8:00 AM Medical Record Number: [id number] Patient Account Number: [id number] Date of Birth/Sex: [birth year] (41 y.o. Female) Treating RN: Montey Hora Primary Care Simpson Paulos: [name] Other Clinician: Referring  Zeba Luby: [name] Treating Ankita Newcomer/Extender: Melburn Hake, HOYT Weeks in Treatment: 0 Clinic Level of Care Assessment Items TOOL 1 Quantity Score []  - Use when EandM and Procedure is performed on INITIAL visit 0 ASSESSMENTS - Nursing Assessment / Reassessment X - General Physical Exam (combine w/ comprehensive assessment (listed just below) when 1 20 performed on new pt. evals) X- 1 25 Comprehensive Assessment (HX, ROS, Risk Assessments, Wounds Hx, etc.) ASSESSMENTS - Wound and Skin Assessment / Reassessment []  - Dermatologic / Skin Assessment (not related to wound area) 0 ASSESSMENTS - Ostomy and/or Continence Assessment and Care []  - Incontinence Assessment and Management 0 []  - 0 Ostomy Care Assessment and Management (repouching, etc.) PROCESS - Coordination of Care X - Simple Patient / Family Education for ongoing care 1 15 []  - 0 Complex (extensive) Patient / Family Education for ongoing care X- 1 10 Staff obtains Programmer, systems, Records, Test Results / Process Orders []  - 0 Staff telephones HHA, Nursing Homes / Clarify orders / etc []  - 0 Routine Transfer to another Facility (non-emergent condition) []  - 0 Routine Hospital Admission (non-emergent condition) X- 1 15 New Admissions / Biomedical engineer / Ordering NPWT, Apligraf, etc. []  - 0 Emergency Hospital Admission (emergent condition) PROCESS - Special Needs []  - Pediatric / Minor Patient Management 0 []  - 0 Isolation Patient Management []  - 0 Hearing / Language / Visual special needs []  - 0 Assessment of Community assistance (transportation, D/C planning, etc.) []  - 0 Additional assistance / Altered mentation []  - 0 Support Surface(s) Assessment (bed, cushion, seat, etc.) [name], Cloteal ([id number]) INTERVENTIONS - Miscellaneous []  - External ear exam 0 []  - 0 Patient Transfer (multiple staff / Civil Service fast streamer / Similar devices) []  - 0 Simple Staple / Suture removal (25 or less) []  - 0 Complex Staple /  Suture removal (26 or more) []  - 0 Hypo/Hyperglycemic Management (do not check if billed separately) X- 1 15 Ankle / Brachial Index (ABI) - do not check if billed separately Has the patient been seen at the hospital within the last three years: Yes Total Score: 100 Level Of Care: New/Established - Level 3 Electronic Signature(s) Signed:  12/16/2017 4:38:28 PM By: Montey Hora Entered By: Montey Hora on 12/16/2017 09:58:37 [name] ([id number]) -------------------------------------------------------------------------------- Encounter Discharge Information Details Patient Name: [name] Date of Service: 12/16/2017 8:00 AM Medical Record Number: [id number] Patient Account Number: [id number] Date of Birth/Sex: 08-Sep-1976 (42 y.o. Female) Treating RN: Montey Hora Primary Care Loys Hoselton: [name] Other Clinician: Referring Tavonte Seybold: [name] Treating Allis Quirarte/Extender: Melburn Hake, HOYT Weeks in Treatment: 0 Encounter Discharge Information Items Discharge Pain Level: 0 Discharge Condition: Stable Ambulatory Status: Ambulatory Discharge Destination: Home Transportation: Private Auto Accompanied By: self Schedule Follow-up Appointment: Yes Medication Reconciliation completed and No provided to Patient/Care Johnsie Moscoso: Provided on Clinical Summary of Care: 12/16/2017 Form Type Recipient Paper Patient OF Electronic Signature(s) Signed: 12/16/2017 9:59:38 AM By: Montey Hora Entered By: Montey Hora on 12/16/2017 09:59:38 [name] ([id number]) -------------------------------------------------------------------------------- Lower Extremity Assessment Details Patient Name: [name] Date of Service: 12/16/2017 8:00 AM Medical Record Number: [id number] Patient Account Number: [id number] Date of Birth/Sex: November 30, 1976 (41 y.o. Female) Treating RN: Montey Hora Primary Care Casondra Gasca: [name] Other Clinician: Referring Macari Zalesky: [name] Treating Clevester Helzer/Extender: Melburn Hake, HOYT Weeks in Treatment: 0 Vascular Assessment Pulses: Dorsalis Pedis Palpable: [Left:Yes] [Right:Yes] Doppler Audible: [Left:Yes] [Right:Yes] Posterior Tibial Palpable: [Left:Yes] [Right:Yes] Doppler Audible: [Left:Yes] [Right:Yes] Extremity colors, hair growth, and conditions: Extremity Color: [Left:Normal] [Right:Normal] Hair Growth on Extremity: [Left:Yes] [Right:Yes] Temperature of Extremity: [Left:Warm] [Right:Warm] Capillary Refill: [Left:< 3 seconds] [Right:< 3 seconds] Blood Pressure: Brachial: [Left:110] [Right:108] Dorsalis Pedis: 102 [Left:Dorsalis Pedis: 110] Ankle: Posterior Tibial: [Left:Posterior Tibial: 0.93] [Right:1.00] Toe Nail Assessment Left: Right: Thick: No No Discolored: No No Deformed: No No Improper Length and Hygiene: No No Electronic Signature(s) Signed: 12/16/2017 4:38:28 PM By: Montey Hora Entered By: Montey Hora on 12/16/2017 08:51:02 [name] ([id number]) -------------------------------------------------------------------------------- Multi Wound Chart Details Patient Name: [name] Date of Service: 12/16/2017 8:00 AM Medical Record Number: [id number] Patient Account Number: [id number] Date of Birth/Sex: May 18, 1976 (42 y.o. Female) Treating RN: Montey Hora Primary Care Nayab Aten: [name] Other Clinician: Referring Vietta Bonifield: [name] Treating Lexington Devine/Extender: Melburn Hake, HOYT Weeks in Treatment: 0 Vital Signs Height(in): 61 Pulse(bpm): 34 Weight(lbs): 304 Blood Pressure(mmHg): 115/75 Body Mass Index(BMI): 57 Temperature(F): 98.0 Respiratory Rate 18 (breaths/min): Photos: [1:No Photos] [N/A:N/A] Wound Location: [1:Left Calcaneus] [N/A:N/A] Wounding Event: [1:Blister] [N/A:N/A] Primary Etiology: [1:Diabetic Wound/Ulcer of the Lower Extremity] [N/A:N/A] Comorbid History: [1:Asthma, Hypertension, Type II Diabetes, Osteoarthritis, Neuropathy]  [N/A:N/A] Date Acquired: [1:10/03/2017] [N/A:N/A] Weeks of Treatment: [1:0] [N/A:N/A] Wound Status: [1:Open] [N/A:N/A] Pending Amputation on [1:Yes] [N/A:N/A] Presentation: Measurements L x W x D [1:0.8x0.3x0.5] [N/A:N/A] (cm) Area (cm) : [1:0.188] [N/A:N/A] Volume (cm) : [1:0.094] [N/A:N/A] Classification: [1:Grade 2] [N/A:N/A] Exudate Amount: [1:Medium] [N/A:N/A] Exudate Type: [1:Serous] [N/A:N/A] Exudate Color: [1:amber] [N/A:N/A] Wound Margin: [1:Flat and Intact] [N/A:N/A] Granulation Amount: [1:Medium (34-66%)] [N/A:N/A] Granulation Quality: [1:Pink] [N/A:N/A] Necrotic Amount: [1:Medium (34-66%)] [N/A:N/A] Exposed Structures: [1:Fat Layer (Subcutaneous Tissue) Exposed: Yes Fascia: No Tendon: No Muscle: No Joint: No Bone: No] [N/A:N/A] Epithelialization: [1:None] [N/A:N/A] Periwound Skin Texture: [1:Callus: Yes Excoriation: No Induration: No Crepitus: No] [N/A:N/A] Rash: No Scarring: No Periwound Skin Moisture: Maceration: No N/A N/A Dry/Scaly: No Periwound Skin Color: Atrophie Blanche: No N/A N/A Cyanosis: No Ecchymosis: No Erythema: No Hemosiderin Staining: No Mottled: No Pallor: No Rubor: No Temperature: No Abnormality N/A N/A Tenderness on Palpation: Yes N/A N/A Wound Preparation: Ulcer Cleansing: N/A N/A Rinsed/Irrigated with Saline Topical Anesthetic Applied: Other: lidocaine 4% Treatment Notes Electronic Signature(s) Signed: 12/16/2017 4:38:28 PM By: Montey Hora Entered By: Montey Hora on 12/16/2017 09:12:04 [name] ([id number]) --------------------------------------------------------------------------------  Multi-Disciplinary Care Plan Details Patient Name: KINDALL, SWABY Date of Service: 12/16/2017 8:00 AM Medical Record Number: [id number] Patient Account Number: [id number] Date of Birth/Sex: January 03, 1976 (42 y.o. Female) Treating RN: Montey Hora Primary Care Arliss Frisina: [name] Other Clinician: Referring Cap Massi: [name] Treating Jameson Tormey/Extender: Melburn Hake, HOYT Weeks in Treatment: 0 Active Inactive ` Abuse / Safety / Falls / Self Care Management Nursing Diagnoses: Impaired physical mobility Goals: Patient will not experience any injury related to falls Date Initiated: 12/16/2017 Target Resolution Date: 03/11/2018 Goal Status: Active Interventions: Assess fall risk on admission and as needed Notes: ` Orientation to the Wound Care Program Nursing Diagnoses: Knowledge deficit related to the wound healing center program Goals: Patient/caregiver will verbalize understanding of the [facility] Program Date Initiated: 12/16/2017 Target Resolution Date: 03/11/2018 Goal Status: Active Interventions: Provide education on orientation to the wound center Notes: ` Wound/Skin Impairment Nursing Diagnoses: Impaired tissue integrity Goals: Ulcer/skin breakdown will heal within 14 weeks Date Initiated: 12/16/2017 Target Resolution Date: 03/11/2018 Goal Status: Active Interventions: SHAWNETTE, AUGELLO ([id number]) Assess patient/caregiver ability to obtain necessary supplies Assess patient/caregiver ability to perform ulcer/skin care regimen upon admission and as needed Assess ulceration(s) every visit Notes: Electronic Signature(s) Signed: 12/16/2017 4:38:28 PM By: Montey Hora Entered By: Montey Hora on 12/16/2017 09:11:53 [name] ([id number]) -------------------------------------------------------------------------------- Pain Assessment Details Patient Name: [name] Date of Service: 12/16/2017 8:00 AM Medical Record Number: [id number] Patient Account Number: [id number] Date of Birth/Sex: [birth year] (42 y.o. Female) Treating RN: Montey Hora Primary Care Pruitt Taboada: [name] Other Clinician: Referring Mart Colpitts: [name] Treating Gregery Walberg/Extender: Melburn Hake, HOYT Weeks in Treatment: 0 Active Problems Location of Pain Severity and Description of  Pain Patient Has Paino Yes Site Locations Pain Location: Pain in Ulcers With Dressing Change: Yes Duration of the Pain. Constant / Intermittento Constant Pain Management and Medication Current Pain Management: Notes Topical or injectable lidocaine is offered to patient for acute pain when surgical debridement is performed. If needed, Patient is instructed to use over the counter pain medication for the following 24-48 hours after debridement. Wound care MDs do not prescribed pain medications. Patient has chronic pain or uncontrolled pain. Patient has been instructed to make an appointment with their Primary Care Physician for pain management. Electronic Signature(s) Signed: 12/16/2017 4:38:28 PM By: Montey Hora Entered By: Montey Hora on 12/16/2017 08:17:36 [name] ([id number]) -------------------------------------------------------------------------------- Patient/Caregiver Education Details Patient Name: [name] Date of Service: 12/16/2017 8:00 AM Medical Record Number: [id number] Patient Account Number: [id number] Date of Birth/Gender: [birth year] (41 y.o. Female) Treating RN: Montey Hora Primary Care Physician: [name] Other Clinician: Referring Physician: [name] Treating Physician/Extender: Sharalyn Ink in Treatment: 0 Education Assessment Education Provided To: Patient Education Topics Provided Wound/Skin Impairment: Handouts: Other: wound care as ordered Methods: Demonstration, Explain/Verbal Responses: State content correctly Electronic Signature(s) Signed: 12/16/2017 4:38:28 PM By: Montey Hora Entered By: Montey Hora on 12/16/2017 09:59:54 [name] ([id number]) -------------------------------------------------------------------------------- Wound Assessment Details Patient Name: [name] Date of Service: 12/16/2017 8:00 AM Medical Record Number: [id number] Patient Account Number: [id number] Date of  Birth/Sex: 05-27-1976 (42 y.o. Female) Treating RN: Montey Hora Primary Care Harmoni Lucus: [name] Other Clinician: Referring Tracy Gerken: [name] Treating Aria Jarrard/Extender: Melburn Hake, HOYT Weeks in Treatment: 0 Wound Status Wound Number: 1 Primary Diabetic Wound/Ulcer of the Lower Extremity Etiology: Wound Location: Left Calcaneus Wound Open Wounding Event: Blister Status: Date Acquired: 10/03/2017 Comorbid Asthma, Hypertension, Type II Diabetes, Weeks Of Treatment: 0 History: Osteoarthritis, Neuropathy Clustered Wound: No Pending Amputation On Presentation  Photos Photo Uploaded By: Montey Hora on 12/16/2017 10:34:32 Wound Measurements Length: (cm) 0.8 Width: (cm) 0.3 Depth: (cm) 0.5 Area: (cm) 0.188 Volume: (cm) 0.094 % Reduction in Area: % Reduction in Volume: Epithelialization: None Tunneling: No Undermining: No Wound Description Classification: Grade 2 Wound Margin: Flat and Intact Exudate Amount: Medium Exudate Type: Serous Exudate Color: amber Foul Odor After Cleansing: No Slough/Fibrino Yes Wound Bed Granulation Amount: Medium (34-66%) Exposed Structure Granulation Quality: Pink Fascia Exposed: No Necrotic Amount: Medium (34-66%) Fat Layer (Subcutaneous Tissue) Exposed: Yes Necrotic Quality: Adherent Slough Tendon Exposed: No Muscle Exposed: No Joint Exposed: No Bone Exposed: No Periwound Skin Texture [name], Ladora ([id number]) Texture Color No Abnormalities Noted: No No Abnormalities Noted: No Callus: Yes Atrophie Blanche: No Crepitus: No Cyanosis: No Excoriation: No Ecchymosis: No Induration: No Erythema: No Rash: No Hemosiderin Staining: No Scarring: No Mottled: No Pallor: No Moisture Rubor: No No Abnormalities Noted: No Dry / Scaly: No Temperature / Pain Maceration: No Temperature: No Abnormality Tenderness on Palpation: Yes Wound Preparation Ulcer Cleansing: Rinsed/Irrigated with Saline Topical Anesthetic  Applied: Other: lidocaine 4%, Treatment Notes Wound #1 (Left Calcaneus) 1. Cleansed with: Clean wound with Normal Saline 2. Anesthetic Topical Lidocaine 4% cream to wound bed prior to debridement 4. Dressing Applied: Other dressing (specify in notes) 5. Secondary Dressing Applied Dry Gauze [place] Notes endoform Electronic Signature(s) Signed: 12/16/2017 4:38:28 PM By: Montey Hora Entered By: Montey Hora on 12/16/2017 08:40:29 [name] ([id number]) -------------------------------------------------------------------------------- Vitals Details Patient Name: [name] Date of Service: 12/16/2017 8:00 AM Medical Record Number: [id number] Patient Account Number: [id number] Date of Birth/Sex: 11-08-1976 (42 y.o. Female) Treating RN: Montey Hora Primary Care Unity Luepke: [name] Other Clinician: Referring Ashwath Lasch: [name] Treating Jimena Wieczorek/Extender: Melburn Hake, HOYT Weeks in Treatment: 0 Vital Signs Time Taken: 08:18 Temperature (F): 98.0 Height (in): 61 Pulse (bpm): 87 Source: Measured Respiratory Rate (breaths/min): 18 Weight (lbs): 304 Blood Pressure (mmHg): 115/75 Source: Measured Reference Range: 80 - 120 mg / dl Body Mass Index (BMI): 57.4 Electronic Signature(s) Signed: 12/16/2017 4:38:28 PM By: Montey Hora Entered By: Montey Hora on 12/16/2017 08:20:35

## 2017-12-19 ENCOUNTER — Encounter: Payer: Medicaid Other | Admitting: Podiatry

## 2017-12-19 NOTE — Telephone Encounter (Signed)
Prior approval for Victoza completed via [employer].  Med approved for 12/15/17- 12/15/18 Prior approval # [id number].  [facility] pharmacy informed.  Surina Storts, Salome Spotted, CMA

## 2017-12-22 ENCOUNTER — Ambulatory Visit: Payer: Medicaid Other | Admitting: Pharmacist

## 2017-12-23 ENCOUNTER — Ambulatory Visit: Payer: Self-pay | Admitting: Physician Assistant

## 2017-12-26 NOTE — Progress Notes (Signed)
This encounter was created in error - please disregard.

## 2017-12-30 ENCOUNTER — Ambulatory Visit: Payer: Self-pay | Admitting: Physician Assistant

## 2018-01-05 ENCOUNTER — Encounter: Payer: Self-pay | Admitting: Family Medicine

## 2018-01-11 ENCOUNTER — Encounter: Payer: Medicaid Other | Attending: Internal Medicine | Admitting: Internal Medicine

## 2018-01-11 DIAGNOSIS — Z79899 Other long term (current) drug therapy: Secondary | ICD-10-CM | POA: Diagnosis not present

## 2018-01-11 DIAGNOSIS — M1990 Unspecified osteoarthritis, unspecified site: Secondary | ICD-10-CM | POA: Insufficient documentation

## 2018-01-11 DIAGNOSIS — L97529 Non-pressure chronic ulcer of other part of left foot with unspecified severity: Secondary | ICD-10-CM | POA: Diagnosis not present

## 2018-01-11 DIAGNOSIS — L97422 Non-pressure chronic ulcer of left heel and midfoot with fat layer exposed: Secondary | ICD-10-CM | POA: Diagnosis not present

## 2018-01-11 DIAGNOSIS — Z87891 Personal history of nicotine dependence: Secondary | ICD-10-CM | POA: Diagnosis not present

## 2018-01-11 DIAGNOSIS — E1140 Type 2 diabetes mellitus with diabetic neuropathy, unspecified: Secondary | ICD-10-CM | POA: Diagnosis not present

## 2018-01-11 DIAGNOSIS — I10 Essential (primary) hypertension: Secondary | ICD-10-CM | POA: Insufficient documentation

## 2018-01-11 DIAGNOSIS — Z885 Allergy status to narcotic agent status: Secondary | ICD-10-CM | POA: Insufficient documentation

## 2018-01-11 DIAGNOSIS — E11621 Type 2 diabetes mellitus with foot ulcer: Secondary | ICD-10-CM | POA: Insufficient documentation

## 2018-01-11 DIAGNOSIS — Z8249 Family history of ischemic heart disease and other diseases of the circulatory system: Secondary | ICD-10-CM | POA: Insufficient documentation

## 2018-01-12 ENCOUNTER — Other Ambulatory Visit
Admission: RE | Admit: 2018-01-12 | Discharge: 2018-01-12 | Disposition: A | Discharge status: Home / Self Care | Payer: Medicaid Other | Source: Other Acute Inpatient Hospital | Attending: Internal Medicine | Admitting: Internal Medicine

## 2018-01-12 DIAGNOSIS — L089 Local infection of the skin and subcutaneous tissue, unspecified: Secondary | ICD-10-CM | POA: Insufficient documentation

## 2018-01-12 NOTE — Progress Notes (Signed)
NATASJA, NIDAY ([id number]) Visit Report for 01/11/2018 Arrival Information Details Patient Name: [name] Date of Service: 01/11/2018 2:30 PM Medical Record Number: [id number] Patient Account Number: [id number] Date of Birth/Sex: 10/12/1976 (42 y.o. Female) Treating RN: Montey Hora Primary Care Darrius Montano: [name] Other Clinician: Referring Adriene Knipfer: [name] Treating Brion Hedges/Extender: Tito Dine in Treatment: 3 Visit Information History Since Last Visit Added or deleted any medications: No Patient Arrived: Wheel Chair Any new allergies or adverse reactions: No Arrival Time: 14:28 Had a fall or experienced change in No Accompanied By: dtr activities of daily living that may affect Transfer Assistance: None risk of falls: Patient Identification Verified: Yes Signs or symptoms of abuse/neglect since last visito No Secondary Verification Process Completed: Yes Hospitalized since last visit: No Patient Has Alerts: Yes Has Dressing in Place as Prescribed: Yes Patient Alerts: DMII Pain Present Now: Yes Electronic Signature(s) Signed: 01/11/2018 4:57:34 PM By: Montey Hora Entered By: Montey Hora on 01/11/2018 14:29:01 [name] ([id number]) -------------------------------------------------------------------------------- Encounter Discharge Information Details Patient Name: [name] Date of Service: 01/11/2018 2:30 PM Medical Record Number: [id number] Patient Account Number: [id number] Date of Birth/Sex: 07/12/1976 (42 y.o. Female) Treating RN: Montey Hora Primary Care Issachar Broady: [name] Other Clinician: Referring Alayla Dethlefs: [name] Treating Ruhaan Nordahl/Extender: Tito Dine in Treatment: 3 Encounter Discharge Information Items Discharge Pain Level: 0 Discharge Condition: Stable Ambulatory Status: Wheelchair Discharge Destination: Home Transportation: Private Auto Accompanied By: dtr Schedule Follow-up  Appointment: Yes Medication Reconciliation completed and No provided to Patient/Care Tiwana Chavis: Provided on Clinical Summary of Care: 01/11/2018 Form Type Recipient Paper Patient TB Electronic Signature(s) Signed: 01/11/2018 4:38:15 PM By: Montey Hora Entered By: Montey Hora on 01/11/2018 16:38:15 [name] ([id number]) -------------------------------------------------------------------------------- Lower Extremity Assessment Details Patient Name: [name] Date of Service: 01/11/2018 2:30 PM Medical Record Number: [id number] Patient Account Number: [id number] Date of Birth/Sex: 04/20/1976 (41 y.o. Female) Treating RN: Montey Hora Primary Care Meighan Treto: [name] Other Clinician: Referring Carsin Randazzo: [name] Treating Pasquale Matters/Extender: Tito Dine in Treatment: 3 Vascular Assessment Pulses: Dorsalis Pedis Palpable: [Left:Yes] Posterior Tibial Extremity colors, hair growth, and conditions: Extremity Color: [Left:Normal] Hair Growth on Extremity: [Left:Yes] Temperature of Extremity: [Left:Warm] Capillary Refill: [Left:< 3 seconds] Electronic Signature(s) Signed: 01/11/2018 4:57:34 PM By: Montey Hora Entered By: Montey Hora on 01/11/2018 14:42:37 [name] ([id number]) -------------------------------------------------------------------------------- Multi Wound Chart Details Patient Name: [name] Date of Service: 01/11/2018 2:30 PM Medical Record Number: [id number] Patient Account Number: [id number] Date of Birth/Sex: [birth year] (42 y.o. 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] [2:Lower Extremity] [N/A:N/A] Comorbid History: [1:Asthma, Hypertension, Type II Asthma, Hypertension, Type II Diabetes, Osteoarthritis, Neuropathy] [2:Diabetes, Osteoarthritis, Neuropathy] [N/A:N/A] Date Acquired: [1:10/03/2017] [2:01/02/2018] [N/A:N/A] Weeks of Treatment: [1:3] [2:0] [N/A:N/A] Wound Status: [1:Open] [2:Open] [N/A:N/A] Pending Amputation on [1:Yes] [2:No] [N/A:N/A] Presentation: Measurements L x W x D [1:0.6x0.3x0.2] [2:2.5x2x0.2] [N/A:N/A] (cm) Area (cm) : [1:0.141] [2:3.927] [N/A:N/A] Volume (cm) : [1:0.028] [2:0.785] [N/A:N/A] % Reduction in Area: [1:25.00%] [2:N/A] [N/A:N/A] % Reduction in Volume: [1:70.20%] [2:N/A] [N/A:N/A] Classification: [1:Grade 2] [2:Grade 2] [N/A:N/A] Exudate Amount: [1:Medium] [2:Large] [N/A:N/A] Exudate Type: [  1:Serous] [2:Purulent] [N/A:N/A] Exudate Color: [1:amber] [2:yellow, brown, green] [N/A:N/A] Wound Margin: [1:Flat and Intact] [2:Flat and Intact] [N/A:N/A] Granulation Amount: [1:Medium (34-66%)] [2:None Present (0%)] [N/A:N/A] Granulation Quality: [1:Pink] [2:N/A] [N/A:N/A] Necrotic Amount: [1:Medium (34-66%)] [2:Large (67-100%)] [N/A:N/A] Necrotic Tissue: [1:Adherent Slough] [2:Eschar] [N/A:N/A] Exposed Structures: [1:Fat Layer (Subcutaneous Tissue) Exposed: Yes Fascia: No Tendon: No Muscle: No Joint: No Bone: No] [2:Fascia: No Fat Layer (Subcutaneous Tissue) Exposed: No Tendon: No Muscle: No Joint: No Bone: No] [N/A:N/A] Epithelialization: [1:None] [2:None] [N/A:N/A] Debridement: Debridement ([id number]) Debridement ([id number]) N/A Pre-procedure 14:55 14:50 N/A Verification/Time Out Taken: Pain Control: Lidocaine 4% Topical Solution Lidocaine 4% Topical  Solution N/A Tissue Debrided: Necrotic/Eschar, Necrotic/Eschar, N/A Fibrin/Slough, Skin, Fibrin/Slough, Skin, Subcutaneous Subcutaneous Level: Skin/Subcutaneous Tissue Skin/Subcutaneous Tissue N/A Debridement Area (sq cm): 0.18 5 N/A Instrument: Curette Curette N/A Specimen: None Swab N/A Number of Specimens N/A 1 N/A Taken: Bleeding: Minimum Minimum N/A Hemostasis Achieved: Pressure Pressure N/A Procedural Pain: 0 0 N/A Post Procedural Pain: 0 0 N/A Debridement Treatment Procedure was tolerated well Procedure was tolerated well N/A Response: Post Debridement 0.8x2x0.3 2.3x2.2x0.3 N/A Measurements L x W x D (cm) Post Debridement Volume: 0.377 1.192 N/A (cm) Periwound Skin Texture: Callus: Yes Excoriation: No N/A Excoriation: No Induration: No Induration: No Callus: No Crepitus: No Crepitus: No Rash: No Rash: No Scarring: No Scarring: No Periwound Skin Moisture: Maceration: No Maceration: No N/A Dry/Scaly: No Dry/Scaly: No Periwound Skin Color: Erythema: Yes Erythema: Yes N/A Atrophie Blanche: No Atrophie Blanche: No Cyanosis: No Cyanosis: No Ecchymosis: No Ecchymosis: No Hemosiderin Staining: No Hemosiderin Staining: No Mottled: No Mottled: No Pallor: No Pallor: No Rubor: No Rubor: No Erythema Location: Circumferential Circumferential N/A Temperature: No Abnormality No Abnormality N/A Tenderness on Palpation: Yes Yes N/A Wound Preparation: Ulcer Cleansing: Ulcer Cleansing: N/A Rinsed/Irrigated with Saline Rinsed/Irrigated with Saline Topical Anesthetic Applied: Topical Anesthetic Applied: Other: lidocaine 4% Other: lidocaine 4% Procedures Performed: Debridement Debridement N/A Treatment Notes Electronic Signature(s) Signed: 01/11/2018 4:49:28 PM By: Linton Ham MD Entered By: Linton Ham on 01/11/2018 15:16:54 KEMONIE, CUTILLO ([id number]) LANETT, LASORSA  ([id number]) -------------------------------------------------------------------------------- Multi-Disciplinary Care Plan Details Patient Name: [name] Date of Service: 01/11/2018 2:30 PM Medical Record Number: [id number] Patient Account Number: [id number] Date of Birth/Sex: [birth year] (42 y.o. Female) Treating RN: Montey Hora Primary Care Isabelly Kobler: [name] Other Clinician: Referring Bowe Sidor: [name] Treating Emmie Frakes/Extender: Tito Dine in Treatment: 3 Active Inactive ` Abuse / Safety / Falls / Self Care Management Nursing Diagnoses: Impaired physical mobility Goals: Patient will not experience any injury related to falls Date Initiated: 12/16/2017 Target Resolution Date: 03/11/2018 Goal Status: Active Interventions: Assess fall risk on admission and as needed Notes: ` Orientation to the Wound Care Program Nursing Diagnoses: Knowledge deficit related to the wound healing center program Goals: Patient/caregiver will verbalize understanding of the [place] Date Initiated: 12/16/2017 Target Resolution Date: 03/11/2018 Goal Status: Active Interventions: Provide education on orientation to the wound center Notes: ` Wound/Skin Impairment Nursing Diagnoses: Impaired tissue integrity Goals: Ulcer/skin breakdown will heal within 14 weeks Date Initiated: 12/16/2017 Target Resolution Date: 03/11/2018 Goal Status: Active Interventions: LEYLANIE, WOODMANSEE ([id number]) Assess patient/caregiver ability to obtain necessary supplies Assess patient/caregiver ability to perform ulcer/skin care regimen upon admission and as needed Assess ulceration(s) every visit Notes: Electronic Signature(s) Signed: 01/11/2018 4:57:34 PM By: Montey Hora Entered By: Montey Hora on 01/11/2018 14:53:08 [name] ([id number]) -------------------------------------------------------------------------------- Pain Assessment Details Patient Name:  [name] Date of Service: 01/11/2018 2:30 PM Medical Record Number: [id number] Patient Account Number: [id number] Date  of Birth/Sex: 10-16-1976 (42 y.o. Female) Treating RN: Montey Hora Primary Care Yannely Kintzel: [name] Other Clinician: Referring Jarred Purtee: [name] Treating Kerina Simoneau/Extender: Tito Dine in Treatment: 3 Active Problems Location of Pain Severity and Description of Pain Patient Has Paino Yes Site Locations Pain Location: Pain in Ulcers With Dressing Change: Yes Duration of the Pain. Constant / Intermittento Intermittent Pain Management and Medication Current Pain Management: Electronic Signature(s) Signed: 01/11/2018 4:57:34 PM By: Montey Hora Entered By: Montey Hora on 01/11/2018 14:30:23 [name] ([id number]) -------------------------------------------------------------------------------- Patient/Caregiver Education Details Patient Name: [name] Date of Service: 01/11/2018 2:30 PM Medical Record Number: [id number] Patient Account Number: [id number] Date of Birth/Gender: [birth year] (42 y.o. Female) Treating RN: Montey Hora Primary Care Physician: [name] Other Clinician: Referring Physician: [name] Treating Physician/Extender: Tito Dine in Treatment: 3 Education Assessment Education Provided To: Patient Education Topics Provided Wound/Skin Impairment: Handouts: Other: wound care as ordered Methods: Explain/Verbal Responses: State content correctly Electronic Signature(s) Signed: 01/11/2018 4:57:34 PM By: Montey Hora Entered By: Montey Hora on 01/11/2018 16:38:30 [name] ([id number]) -------------------------------------------------------------------------------- Wound Assessment Details Patient Name: [name] Date of Service: 01/11/2018 2:30 PM Medical Record Number: [id number] Patient Account Number: [id number] Date of Birth/Sex: 05-14-1976 (42 y.o.  Female) Treating RN: Montey Hora Primary Care Thelonious Kauffmann: [name] Other Clinician: Referring Sara Keys: [name] Treating Neya Creegan/Extender: Tito Dine in Treatment: 3 Wound Status Wound Number: 1 Primary Diabetic Wound/Ulcer of the Lower Extremity Etiology: Wound Location: Left Calcaneus Wound Open Wounding Event: Blister Status: Date Acquired: 10/03/2017 Comorbid Asthma, Hypertension, Type II Diabetes, Weeks Of Treatment: 3 History: Osteoarthritis, Neuropathy Clustered Wound: No Pending Amputation On Presentation Photos Photo Uploaded By: Montey Hora on 01/11/2018 16:51:00 Wound Measurements Length: (cm) 0.6 Width: (cm) 0.3 Depth: (cm) 0.2 Area: (cm) 0.141 Volume: (cm) 0.028 % Reduction in Area: 25% % Reduction in Volume: 70.2% Epithelialization: None Tunneling: No Undermining: No Wound Description Classification: Grade 2 Wound Margin: Flat and Intact Exudate Amount: Medium Exudate Type: Serous Exudate Color: amber Foul Odor After Cleansing: No Slough/Fibrino Yes Wound Bed Granulation Amount: Medium (34-66%) Exposed Structure Granulation Quality: Pink Fascia Exposed: No Necrotic Amount: Medium (34-66%) Fat Layer (Subcutaneous Tissue) Exposed: Yes Necrotic Quality: Adherent Slough Tendon Exposed: No Muscle Exposed: No Joint Exposed: No Bone Exposed: No Periwound Skin Texture [name], [place] ([id number]) Texture Color No Abnormalities Noted: No No Abnormalities Noted: No Callus: Yes Atrophie Blanche: No Crepitus: No Cyanosis: No Excoriation: No Ecchymosis: No Induration: No Erythema: Yes Rash: No Erythema Location: Circumferential Scarring: No Hemosiderin Staining: No Mottled: No Moisture Pallor: No No Abnormalities Noted: No Rubor: No Dry / Scaly: No Maceration: No Temperature / Pain Temperature: No Abnormality Tenderness on Palpation: Yes Wound Preparation Ulcer Cleansing: Rinsed/Irrigated with  Saline Topical Anesthetic Applied: Other: lidocaine 4%, Treatment Notes Wound #1 (Left Calcaneus) 1. Cleansed with: Clean wound with Normal Saline 2. Anesthetic Topical Lidocaine 4% cream to wound bed prior to debridement 4. Dressing Applied: Other dressing (specify in notes) 5. Secondary Dressing Applied Dry Gauze Foam [place] Notes [facility] Electronic Signature(s) Signed: 01/11/2018 4:57:34 PM By: Montey Hora Entered By: Montey Hora on 01/11/2018 14:42:20 [name] ([id number]) -------------------------------------------------------------------------------- Wound Assessment Details Patient Name: [name] Date of Service: 01/11/2018 2:30 PM Medical Record Number: [id number] Patient Account Number: [id number] Date of Birth/Sex: February 15, 1976 (42 y.o. Female) Treating RN: Montey Hora Primary Care Vong Garringer: [name] Other Clinician: Referring Ishmeal Rorie: [name] Treating Camylle Whicker/Extender: Ricard Dillon Weeks in Treatment: 3 Wound Status Wound Number: 2 Primary Diabetic  Wound/Ulcer of the Lower Extremity Etiology: Wound Location: Left Metatarsal head fifth - Lateral Wound Open Wounding Event: Gradually Appeared Status: Date Acquired: 01/02/2018 Comorbid Asthma, Hypertension, Type II Diabetes, Weeks Of Treatment: 0 History: Osteoarthritis, Neuropathy Clustered Wound: No Photos Photo Uploaded By: Montey Hora on 01/11/2018 16:51:00 Wound Measurements Length: (cm) 2.5 Width: (cm) 2 Depth: (cm) 0.2 Area: (cm) 3.927 Volume: (cm) 0.785 % Reduction in Area: % Reduction in Volume: Epithelialization: None Tunneling: No Undermining: No Wound Description Classification: Grade 2 Wound Margin: Flat and Intact Exudate Amount: Large Exudate Type: Purulent Exudate Color: yellow, brown, green Foul Odor After Cleansing: No Slough/Fibrino No Wound Bed Granulation Amount: None Present (0%) Exposed Structure Necrotic Amount: Large  (67-100%) Fascia Exposed: No Necrotic Quality: Eschar Fat Layer (Subcutaneous Tissue) Exposed: No Tendon Exposed: No Muscle Exposed: No Joint Exposed: No Bone Exposed: No Periwound Skin Texture [name], Alleen ([id number]) Texture Color No Abnormalities Noted: No No Abnormalities Noted: No Callus: No Atrophie Blanche: No Crepitus: No Cyanosis: No Excoriation: No Ecchymosis: No Induration: No Erythema: Yes Rash: No Erythema Location: Circumferential Scarring: No Hemosiderin Staining: No Mottled: No Moisture Pallor: No No Abnormalities Noted: No Rubor: No Dry / Scaly: No Maceration: No Temperature / Pain Temperature: No Abnormality Tenderness on Palpation: Yes Wound Preparation Ulcer Cleansing: Rinsed/Irrigated with Saline Topical Anesthetic Applied: Other: lidocaine 4%, Treatment Notes Wound #2 (Left, Lateral Metatarsal head fifth) 1. Cleansed with: Clean wound with Normal Saline 2. Anesthetic Topical Lidocaine 4% cream to wound bed prior to debridement 4. Dressing Applied: Other dressing (specify in notes) 5. Secondary Dressing Applied Dry Gauze Foam [place] Notes [employer] Electronic Signature(s) Signed: 01/11/2018 4:57:34 PM By: Montey Hora Entered By: Montey Hora on 01/11/2018 14:42:06 MIKYAH, ALAMO ([id number]) -------------------------------------------------------------------------------- Vitals Details Patient Name: [name] Date of Service: 01/11/2018 2:30 PM Medical Record Number: [id number] Patient Account Number: [id number] Date of Birth/Sex: [birth year] (42 y.o. Female) Treating RN: Montey Hora Primary Care Maykel Reitter: [name] Other Clinician: Referring Lavere Stork: [name] Treating Malli Falotico/Extender: Tito Dine in Treatment: 3 Vital Signs Time Taken: 14:30 Temperature (F): 98.4 Height (in): 61 Pulse (bpm): 89 Weight (lbs): 304 Respiratory Rate (breaths/min): 18 Body Mass Index (BMI): 57.4 Blood  Pressure (mmHg): 156/84 Reference Range: 80 - 120 mg / dl Electronic Signature(s) Signed: 01/11/2018 4:57:34 PM By: Montey Hora Entered By: Montey Hora on 01/11/2018 14:31:48

## 2018-01-12 NOTE — Progress Notes (Addendum)
[name], [name] ([id number]) Visit Report for 01/11/2018 Debridement Details Patient Name: [name], [name] Date of Service: 01/11/2018 2:30 PM Medical Record Number: [id number] Patient Account Number: [id number] Date of Birth/Sex: [birth year] (42 y.o. Female) Treating RN: Montey Hora Primary Care Provider: [name] Other Clinician: Referring Provider: [name] Treating Provider/Extender: Tito Dine in Treatment: 3 Debridement Performed for Wound #1 Left Calcaneus Assessment: Performed By: Physician Ricard Dillon, MD Debridement: Debridement Severity of Tissue Pre Fat layer exposed Debridement: Pre-procedure Verification/Time Yes - 14:55 Out Taken: Start Time: 14:55 Pain Control: Lidocaine 4% Topical Solution Level: Skin/Subcutaneous Tissue Total Area Debrided (L x W): 0.6 (cm) x 0.3 (cm) = 0.18 (cm) Tissue and other material Viable, Non-Viable, Eschar, Fibrin/Slough, Skin, Subcutaneous debrided: Instrument: Curette Bleeding: Minimum Hemostasis Achieved: Pressure End Time: 14:59 Procedural Pain: 0 Post Procedural Pain: 0 Response to Treatment: Procedure was tolerated well Post Debridement Measurements of Total Wound Length: (cm) 0.8 Width: (cm) 2 Depth: (cm) 0.3 Volume: (cm) 0.377 Character of Wound/Ulcer Post Debridement: Improved Severity of Tissue Post Debridement: Fat layer exposed Post Procedure Diagnosis Same as Pre-procedure Electronic Signature(s) Signed: 01/11/2018 4:49:28 PM By: Linton Ham MD Signed: 01/11/2018 4:57:34 PM By: Montey Hora Entered By: Linton Ham on 01/11/2018 15:17:15 [name] ([id number]) -------------------------------------------------------------------------------- Debridement Details Patient Name: [name] Date of Service: 01/11/2018 2:30 PM Medical Record Number: [id number] Patient Account Number: [id number] Date of Birth/Sex: 1976-11-12 (42 y.o. Female) Treating RN: Montey Hora Primary  Care Provider: [name] Other Clinician: Referring Provider: [name] Treating Provider/Extender: Tito Dine in Treatment: 3 Debridement Performed for Wound #2 Left,Lateral Metatarsal head fifth Assessment: Performed By: Physician Ricard Dillon, MD Debridement: Debridement Severity of Tissue Pre Fat layer exposed Debridement: Pre-procedure Verification/Time Yes - 14:50 Out Taken: Start Time: 14:50 Pain Control: Lidocaine 4% Topical Solution Level: Skin/Subcutaneous Tissue Total Area Debrided (L x W): 2.5 (cm) x 2 (cm) = 5 (cm) Tissue and other material Viable, Non-Viable, Eschar, Fibrin/Slough, Skin, Subcutaneous debrided: Instrument: Curette Specimen: Swab Number of Specimens Taken: 1 Bleeding: Minimum Hemostasis Achieved: Pressure End Time: 14:55 Procedural Pain: 0 Post Procedural Pain: 0 Response to Treatment: Procedure was tolerated well Post Debridement Measurements of Total Wound Length: (cm) 2.3 Width: (cm) 2.2 Depth: (cm) 0.3 Volume: (cm) 1.192 Character of Wound/Ulcer Post Debridement: Improved Severity of Tissue Post Debridement: Fat layer exposed Post Procedure Diagnosis Same as Pre-procedure Electronic Signature(s) Signed: 01/11/2018 4:49:28 PM By: Linton Ham MD Signed: 01/11/2018 4:57:34 PM By: Montey Hora Entered By: Linton Ham on 01/11/2018 15:17:29 [name] ([id number]) -------------------------------------------------------------------------------- HPI Details Patient Name: [name] Date of Service: 01/11/2018 2:30 PM Medical Record Number: [id number] Patient Account Number: [id number] Date of Birth/Sex: 01-10-1976 (42 y.o. Female) Treating RN: Montey Hora Primary Care Provider: [name] Other Clinician: Referring Provider: [name] Treating Provider/Extender: Tito Dine in Treatment: 3 History of Present Illness Associated Signs and Symptoms: Patient has a  history of diabetes mellitus type II, diabetic neuropathy, and hypertension. HPI Description: 12/16/17 patient presents today for evaluation concerning her left heel ulcer which has been present since June 2018. This occurred after she was outside knowing in her shoes with no socks. At this point in time she has been on Levaquin since November due to a significant infection which did wind up with her in the hospital. She is diabetic and unfortunately her diabetes is not well controlled with a hemoglobin A-1 C of 13.0 last evaluated on 10/29/17. Patient is also medication for high blood pressure. She  did have an MRI of her left ankle with and without contrast which was performed on 10/29/17 and this showed skin ulcerations on the field with mild appearing underlying cellulitis. At that point it was negative for abscess, osteomyelitis, or septic joint. There was also mild appearing plantar fasciitis noted. Patient notes that this is a very tender spot on her heel at this point. Fortunately she does not seem to be having any significant sign of infection at this point she is still on the Levaquin. She has previously been treated by her podiatrist. Her pain is rated to be a 6-7/10 worse at times with palpation or cleansing over the area. 01/11/18; the patient has not been here in almost a month. She presented with a left heel ulcer. She is a type II diabetic with neuropathy. MRI did not suggest osteomyelitis she's been packing the area with Endoform which is on the posterior left heel. She states that about 3 or 4 days ago she noted a change in skin color over the left fifth metatarsal head laterally she arrives today with erythema in this area with necrotic skin over the surface. Electronic Signature(s) Signed: 01/11/2018 4:49:28 PM By: Linton Ham MD Entered By: Linton Ham on 01/11/2018 15:18:54 [name]  ([id number]) -------------------------------------------------------------------------------- Physical Exam Details Patient Name: [name] Date of Service: 01/11/2018 2:30 PM Medical Record Number: [id number] Patient Account Number: [id number] Date of Birth/Sex: Mar 29, 1976 (42 y.o. Female) Treating RN: Montey Hora Primary Care Provider: [name] Other Clinician: Referring Provider: [name] Treating Provider/Extender: Tito Dine in Treatment: 3 Constitutional Patient is hypertensive.. Pulse regular and within target range for patient.Marland Kitchen Respirations regular, non-labored and within target range.. Temperature is normal and within the target range for the patient.Marland Kitchen appears in no distress. Notes Wound exam; the original wound had callus with a secondary slitlike small wound next to it. Both of the areas were debrided. The original wound still has a probing tunnel wears the slitlike small area was superficial post-debridement. Hemostasis with silver nitrate. Reason #3 curet oShe has a new area on the lateral aspect of the fifth metatarsal head this had nonviable skin over the surface which was removed necrotic debris on the surface of the wound. A culture was done of this area although there was no gross purulence. Surrounding erythema noted Electronic Signature(s) Signed: 01/11/2018 4:49:28 PM By: Linton Ham MD Entered By: Linton Ham on 01/11/2018 15:20:27 [name] ([id number]) -------------------------------------------------------------------------------- Physician Orders Details Patient Name: [name] Date of Service: 01/11/2018 2:30 PM Medical Record Number: [id number] Patient Account Number: [id number] Date of Birth/Sex: 1976/07/06 (42 y.o. Female) Treating RN: Montey Hora Primary Care Provider: [name] Other Clinician: Referring Provider: [name] Treating Provider/Extender: Tito Dine in Treatment:  3 Verbal / Phone Orders: No Diagnosis Coding Patient Medications Allergies: Nubain Notifications Medication Indication Start End doxycycline monohydrate wound infection 01/11/2018 DOSE oral 100 mg capsule - 1 capsule oral bid for 7 days Cipro wound infection. 01/16/2018 DOSE oral 500 mg tablet - 1 tablet oral bid for 7 days. d/c doxy Electronic Signature(s) Signed: 01/16/2018 7:55:33 AM By: Linton Ham MD Previous Signature: 01/11/2018 3:06:17 PM Version By: Linton Ham MD Entered By: Linton Ham on 01/16/2018 07:55:33 [name] ([id number]) -------------------------------------------------------------------------------- Problem List Details Patient Name: [name] Date of Service: 01/11/2018 2:30 PM Medical Record Number: [id number] Patient Account Number: [id number] Date of Birth/Sex: 02-16-1976 (42 y.o. 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/16/2017 Yes exposed Kirby (primary) hypertension 12/16/2017 Yes E11.40 Type 2 diabetes mellitus with diabetic neuropathy, unspecified 12/16/2017 Yes Inactive Problems Resolved Problems Electronic Signature(s) Signed: 01/11/2018 4:49:28 PM By: Linton Ham MD Entered By: Linton Ham on 01/11/2018 15:16:43 [name] ([id number]) -------------------------------------------------------------------------------- Progress Note Details Patient Name: [name] Date of Service: 01/11/2018 2:30 PM Medical Record Number: [id number] Patient Account Number: [id number] Date of Birth/Sex: Nov 01, 1976 (42 y.o. Female) Treating RN: Montey Hora Primary Care  Provider: [name] Other Clinician: Referring Provider: [name] Treating Provider/Extender: Tito Dine in Treatment: 3 Subjective History of Present Illness (HPI) The following HPI elements were documented for the patient's wound: Associated Signs and Symptoms: Patient has a history of diabetes mellitus type II, diabetic neuropathy, and hypertension. 12/16/17 patient presents today for evaluation concerning her left heel ulcer which has been present since June 2018. This occurred after she was outside knowing in her shoes with no socks. At this point in time she has been on Levaquin since November due to a significant infection which did wind up with her in the hospital. She is diabetic and unfortunately her diabetes is not well controlled with a hemoglobin A-1 C of 13.0 last evaluated on 10/29/17. Patient is also medication for high blood pressure. She did have an MRI of her left ankle with and without contrast which was performed on 10/29/17 and this showed skin ulcerations on the field with mild appearing underlying cellulitis. At that point it was negative for abscess, osteomyelitis, or septic joint. There was also mild appearing plantar fasciitis noted. Patient notes that this is a very tender spot on her heel at this point. Fortunately she does not seem to be having any significant sign of infection at this point she is still on the Levaquin. She has previously been treated by her podiatrist. Her pain is rated to be a 6-7/10 worse at times with palpation or cleansing over the area. 01/11/18; the patient has not been here in almost a month. She presented with a left heel ulcer. She is a type II diabetic with neuropathy. MRI did not suggest osteomyelitis she's been packing the area with Endoform which is on the posterior left heel. She states that about 3 or 4 days ago she noted a change in skin color over the left fifth metatarsal head laterally she  arrives today with erythema in this area with necrotic skin over the surface. Objective Constitutional Patient is hypertensive.. Pulse regular and within target range for patient.Marland Kitchen Respirations regular, non-labored and within target range.. Temperature is normal and within the target range for the patient.Marland Kitchen appears in no distress. Vitals Time Taken: 2:30 PM, Height: 61 in, Weight: 304 lbs, BMI: 57.4, Temperature: 98.4 F, Pulse: 89 bpm, Respiratory Rate: 18 breaths/min, Blood Pressure: 156/84 mmHg. General Notes: Wound exam; the original wound had callus with a secondary slitlike small wound next to it. Both of the areas were debrided. The original wound still has a probing tunnel wears the slitlike small area was superficial post-debridement. Hemostasis with silver nitrate. Reason #3 curet She has a new area on the lateral aspect of the fifth metatarsal head this had nonviable skin over the surface which was removed necrotic debris on the surface of the wound. A culture was done of this  area although there was no gross purulence. Surrounding erythema noted Integumentary (Hair, Skin) Wound #1 status is Open. Original cause of wound was Blister. The wound is located on the Left Calcaneus. The wound measures 0.6cm length x 0.3cm width x 0.2cm depth; 0.141cm^2 area and 0.028cm^3 volume. There is Fat Layer (Subcutaneous Tissue) Exposed exposed. There is no tunneling or undermining noted. There is a medium amount of serous [name], [name] ([id number]) drainage noted. The wound margin is flat and intact. There is medium (34-66%) pink granulation within the wound bed. There is a medium (34-66%) amount of necrotic tissue within the wound bed including Adherent Slough. The periwound skin appearance exhibited: Callus, Erythema. The periwound skin appearance did not exhibit: Crepitus, Excoriation, Induration, Rash, Scarring, Dry/Scaly, Maceration, Atrophie Blanche, Cyanosis, Ecchymosis, Hemosiderin Staining,  Mottled, Pallor, Rubor. The surrounding wound skin color is noted with erythema which is circumferential. Periwound temperature was noted as No Abnormality. The periwound has tenderness on palpation. Wound #2 status is Open. Original cause of wound was Gradually Appeared. The wound is located on the Left,Lateral Metatarsal head fifth. The wound measures 2.5cm length x 2cm width x 0.2cm depth; 3.927cm^2 area and 0.785cm^3 volume. There is no tunneling or undermining noted. There is a large amount of purulent drainage noted. The wound margin is flat and intact. There is no granulation within the wound bed. There is a large (67-100%) amount of necrotic tissue within the wound bed including Eschar. The periwound skin appearance exhibited: Erythema. The periwound skin appearance did not exhibit: Callus, Crepitus, Excoriation, Induration, Rash, Scarring, Dry/Scaly, Maceration, Atrophie Blanche, Cyanosis, Ecchymosis, Hemosiderin Staining, Mottled, Pallor, Rubor. The surrounding wound skin color is noted with erythema which is circumferential. Periwound temperature was noted as No Abnormality. The periwound has tenderness on palpation. 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 There was a Skin/Subcutaneous Tissue Debridement ([id number]) debridement with total area of 0.18 sq cm performed by Ricard Dillon, MD. with the following instrument(s): Curette to remove Viable and Non-Viable tissue/material including Fibrin/Slough, Eschar, Skin, and Subcutaneous after achieving  pain control using Lidocaine 4% Topical Solution. A time out was conducted at 14:55, prior to the start of the procedure. A Minimum amount of bleeding was controlled with Pressure. The procedure was tolerated well with a pain level of 0 throughout and a pain level of 0 following the procedure. Post Debridement Measurements: 0.8cm length x 2cm width x 0.3cm depth; 0.377cm^3 volume. Character of Wound/Ulcer Post Debridement is improved. Severity of Tissue Post Debridement is: Fat layer exposed. Post procedure Diagnosis Wound #1: Same as Pre-Procedure Wound #2 Pre-procedure diagnosis of Wound #2 is a Diabetic Wound/Ulcer of the Lower Extremity located on the Left,Lateral Metatarsal head fifth .Severity of Tissue Pre Debridement is: Fat layer exposed. There was a Skin/Subcutaneous Tissue Debridement ([id number]) debridement with total area of 5 sq cm performed by Ricard Dillon, MD. with the following instrument(s): Curette to remove Viable and Non-Viable tissue/material including Fibrin/Slough, Eschar, Skin, and Subcutaneous after achieving pain control using Lidocaine 4% Topical Solution. 1 Specimen was taken by a Swab and sent to the lab per facility protocol.A time out was  conducted at 14:50, prior to the start of the procedure. A Minimum amount of bleeding was controlled with Pressure. The procedure was tolerated well with a pain level of 0 throughout and a pain level of 0 following the procedure. Post Debridement Measurements: 2.3cm length x 2.2cm width x 0.3cm depth; 1.192cm^3 volume. [name], [name] ([id number]) Character of Wound/Ulcer Post Debridement is improved. Severity of Tissue Post Debridement is: Fat layer exposed. Post procedure Diagnosis Wound #2: Same as Pre-Procedure Plan The following medication(s) was prescribed: doxycycline monohydrate oral 100 mg capsule 1 capsule oral bid for 7 days for wound infection starting 01/11/2018 Cipro oral 500 mg tablet 1 tablet oral bid for 7 days.  d/c doxy for wound infection. starting 01/16/2018 o #1 to both wound areas I prescribed silver alginate. Unfortunately extensive debridements were necessary in both areas #2 culture taken of the lateral aspect of the left fifth metatarsal head #3. Doxycycline ES at 100 twice a day for 7 days #4 repeat x-ray of the foot may be necessary #5 she has a heel offloading boot. Electronic Signature(s) Signed: 01/24/2018 10:42:09 AM By: Worthy Keeler PA-C Signed: 01/25/2018 10:12:36 AM By: Linton Ham MD Previous Signature: 01/11/2018 4:49:28 PM Version By: Linton Ham MD Entered By: Worthy Keeler on 01/24/2018 08:23:29 AME, [name] ([id number]) -------------------------------------------------------------------------------- SuperBill Details Patient Name: [name] Date of Service: 01/11/2018 Medical Record Number: [id number] Patient Account Number: [id number] Date of Birth/Sex: November 12, 1976 (42 y.o. Female) Treating RN: Montey Hora Primary Care Provider: [name] Other Clinician: Referring Provider: [name] Treating Provider/Extender: Tito Dine in Treatment: 3 Diagnosis Coding ICD-10 Codes Code Description E11.621 Type 2 diabetes mellitus with foot ulcer 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 layer exposed I10 Essential (primary) hypertension E11.40 Type 2 diabetes mellitus with diabetic neuropathy, unspecified L97.521 Non-pressure chronic ulcer of other part of left foot limited to breakdown of skin Facility Procedures CPT4 Code Description: [id number] 11042 - DEB SUBQ TISSUE 20 SQ CM/< ICD-10 Diagnosis Description L97.521 Non-pressure chronic ulcer of other part of left foot limited t 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 la Modifier: o breakdown of s yer exposed Quantity: 1 kin Physician Procedures CPT4 Code Description: [id number] - WC PHYS SUBQ TISS 20 SQ CM ICD-10 Diagnosis  Description L97.521 Non-pressure chronic ulcer of other part of left foot limited t 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 la Modifier: o breakdown of sk yer exposed Quantity: 1 in Electronic Signature(s) Signed: 01/11/2018 4:49:28 PM By: Linton Ham MD Entered By: Linton Ham on 01/11/2018 15:24:18

## 2018-01-14 LAB — AEROBIC CULTURE W GRAM STAIN (SUPERFICIAL SPECIMEN): Gram Stain: NONE SEEN

## 2018-01-14 LAB — AEROBIC CULTURE  (SUPERFICIAL SPECIMEN)

## 2018-01-16 ENCOUNTER — Other Ambulatory Visit: Payer: Self-pay | Admitting: *Deleted

## 2018-01-16 DIAGNOSIS — E1142 Type 2 diabetes mellitus with diabetic polyneuropathy: Secondary | ICD-10-CM

## 2018-01-17 MED ORDER — ALBUTEROL SULFATE HFA 108 (90 BASE) MCG/ACT IN AERS: 1.0000 | INHALATION_SPRAY | Freq: Four times a day (QID) | RESPIRATORY_TRACT | 5 refills | Status: DC | PRN

## 2018-01-17 MED ORDER — INSULIN DEGLUDEC 200 UNIT/ML ~~LOC~~ SOPN: 105.0000 [IU] | PEN_INJECTOR | Freq: Every day | SUBCUTANEOUS | 2 refills | Status: DC

## 2018-01-17 MED ORDER — GABAPENTIN 600 MG PO TABS: 1200.0000 mg | ORAL_TABLET | Freq: Three times a day (TID) | ORAL | 5 refills | Status: DC

## 2018-01-17 MED ORDER — LIRAGLUTIDE 18 MG/3ML ~~LOC~~ SOPN: PEN_INJECTOR | SUBCUTANEOUS | 2 refills | Status: DC

## 2018-01-18 ENCOUNTER — Encounter: Payer: Medicaid Other | Admitting: Internal Medicine

## 2018-01-18 DIAGNOSIS — E11621 Type 2 diabetes mellitus with foot ulcer: Secondary | ICD-10-CM | POA: Diagnosis not present

## 2018-01-19 IMAGING — CT CT ABD-PELV W/ CM
2 of 4 series · 17 of 46 positions shown, 19 images · IV contrast (Omni 300)
Comparison: 11/23/2015

CLINICAL DATA: Lower abdominal pain, low back pain

EXAM:
CT ABDOMEN AND PELVIS WITH CONTRAST
TECHNIQUE: Multidetector CT imaging of the abdomen and pelvis was performed
using the standard protocol following bolus administration of
intravenous contrast.
CONTRAST:  100mL [id number] IOPAMIDOL ([id number]) INJECTION 61%

[Series 2: a/p w/ 5mm · axial · 0.94mm/px · z∈[-431,+19]mm · 14 of 102 slices shown, 16 images]
[im 6/102  soft-tissue]
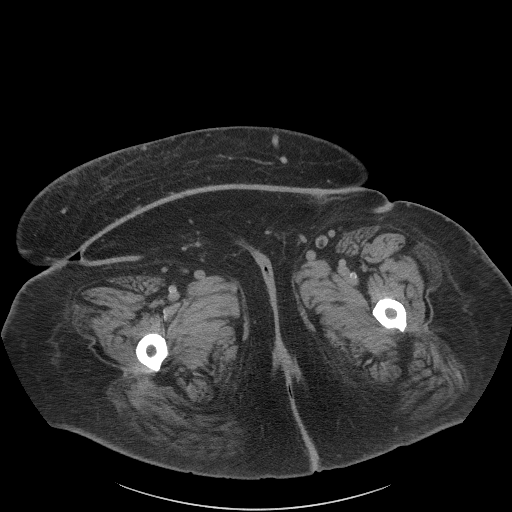
[im 6/102  bone]
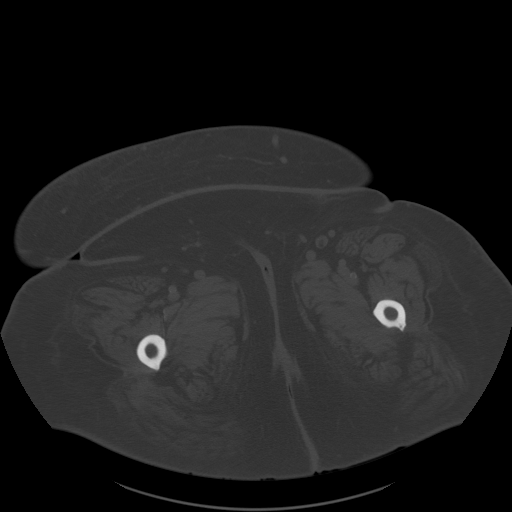
[im 12/102  soft-tissue]
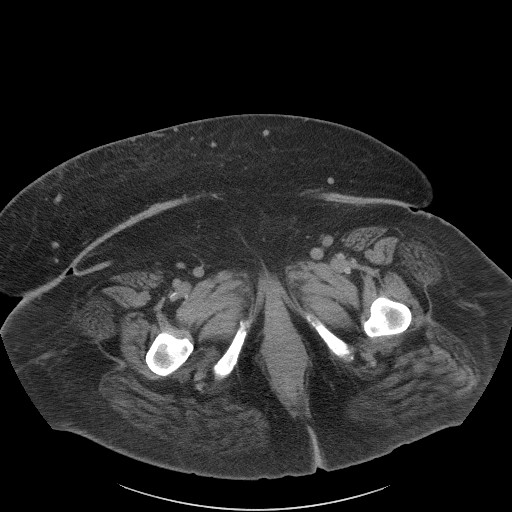
[im 23/102  soft-tissue]
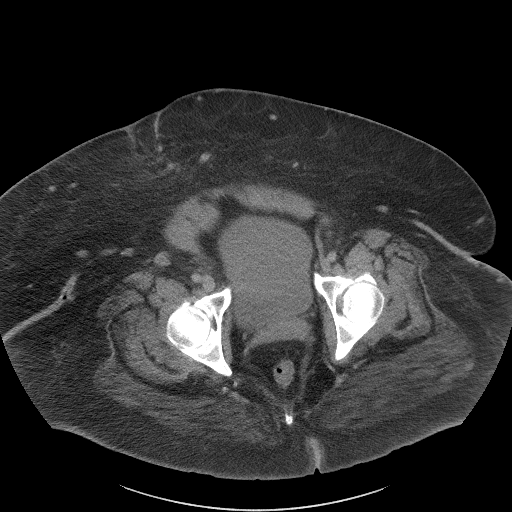
[im 29/102  soft-tissue]
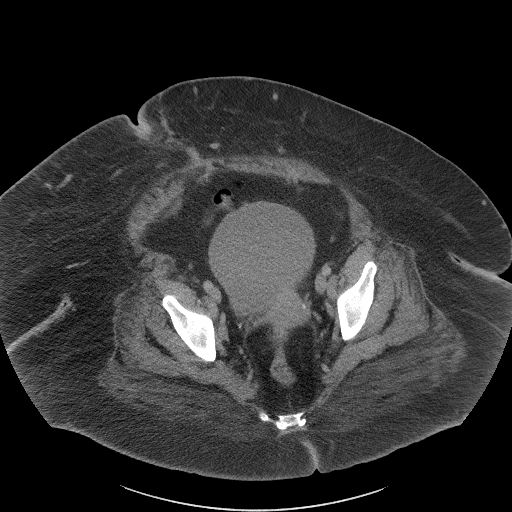
[im 34/102  soft-tissue]
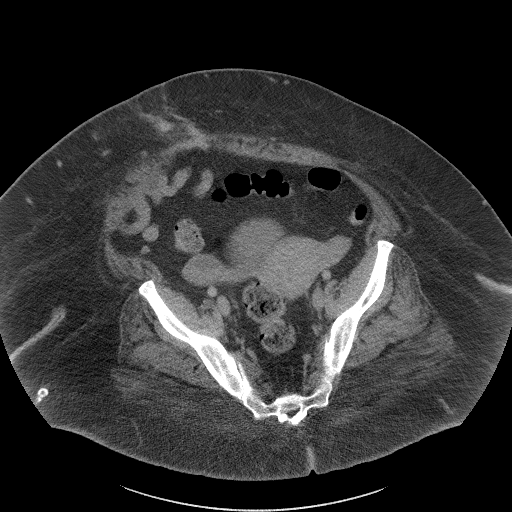
[im 40/102  soft-tissue]
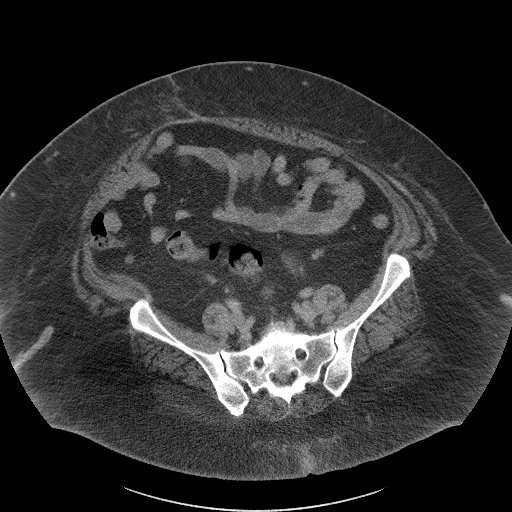
[im 45/102  soft-tissue]
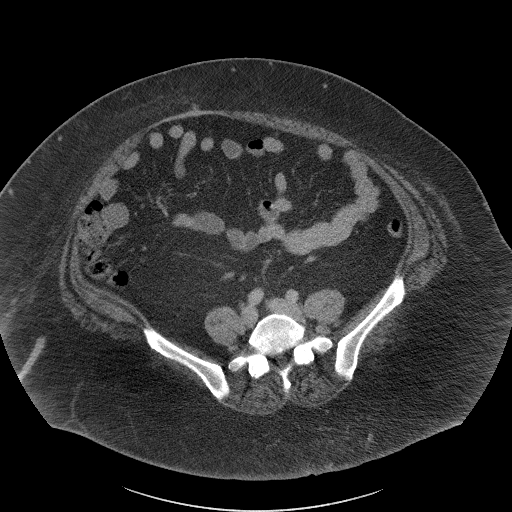
[im 57/102  soft-tissue]
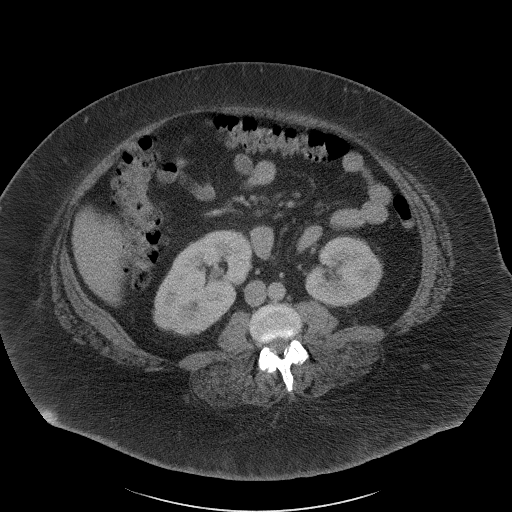
[im 62/102  soft-tissue]
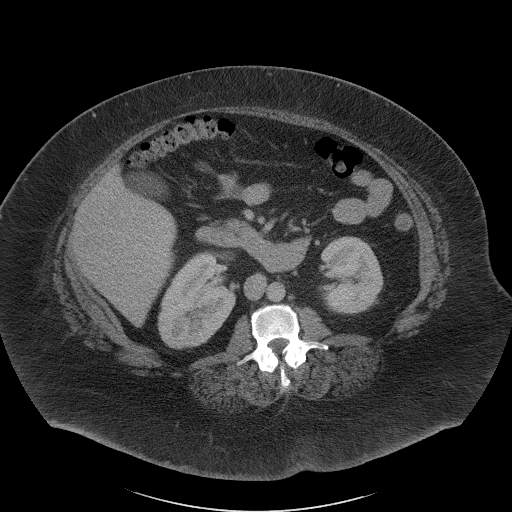
[im 62/102  bone]
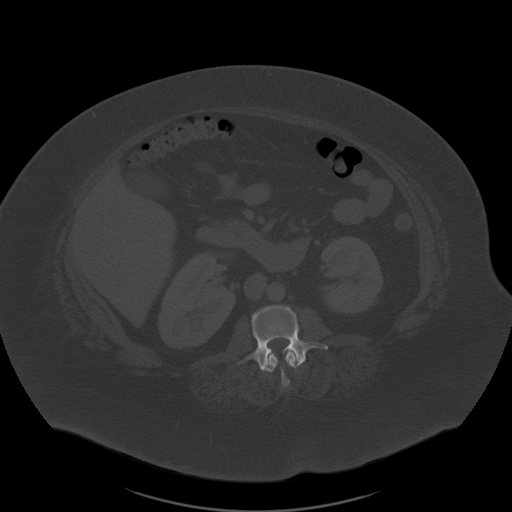
[im 68/102  soft-tissue]
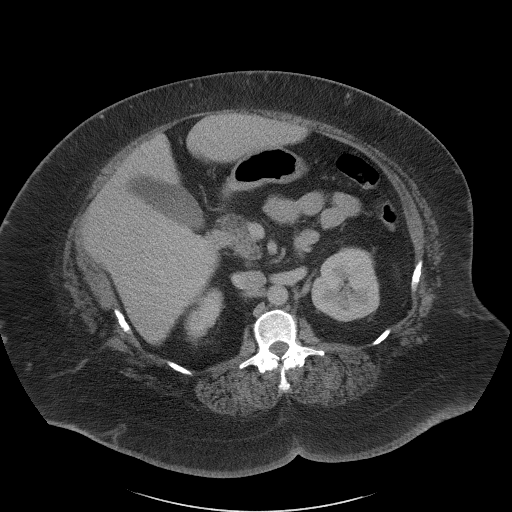
[im 73/102  soft-tissue]
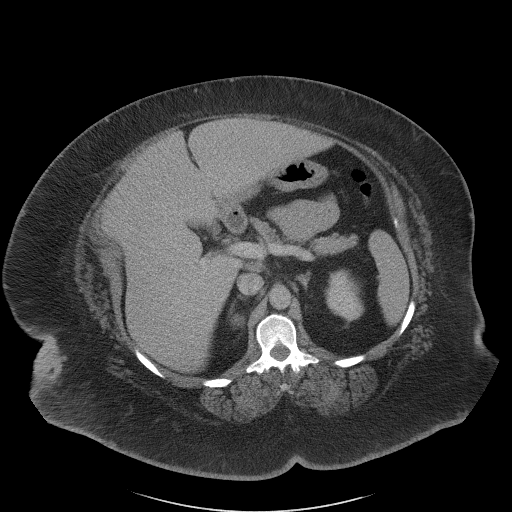
[im 79/102  soft-tissue]
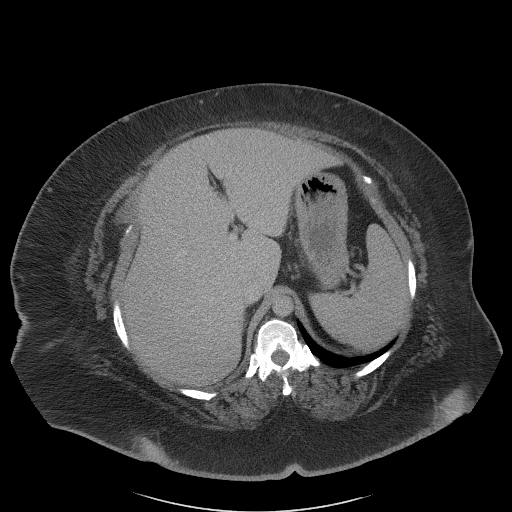
[im 90/102  soft-tissue]
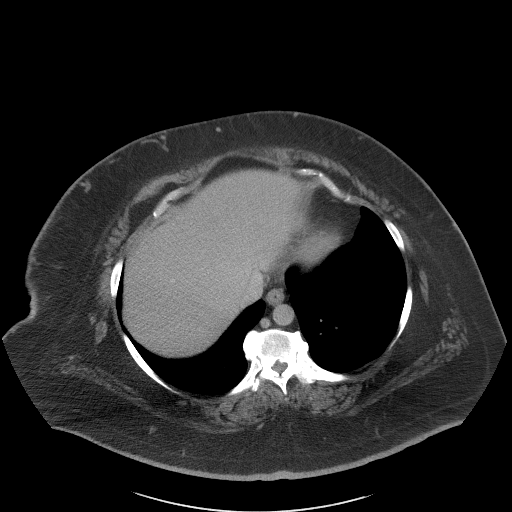
[im 96/102  soft-tissue]
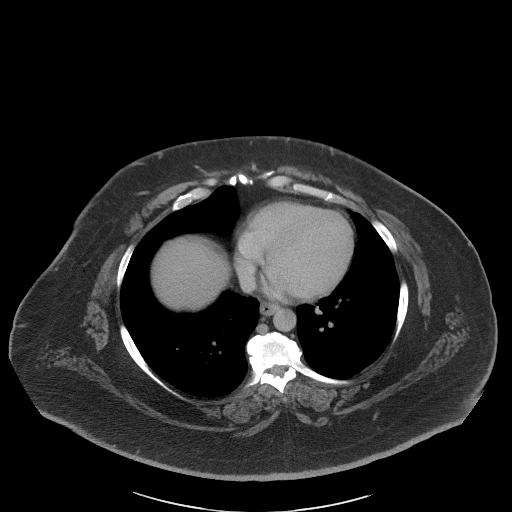

[Series 5: a/p w/ cor · coronal · 0.99mm/px · 3 of 174 slices shown]
[im 58/174  soft-tissue]
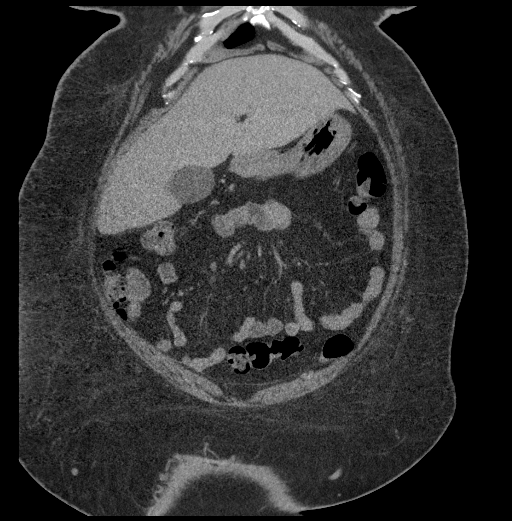
[im 77/174  soft-tissue]
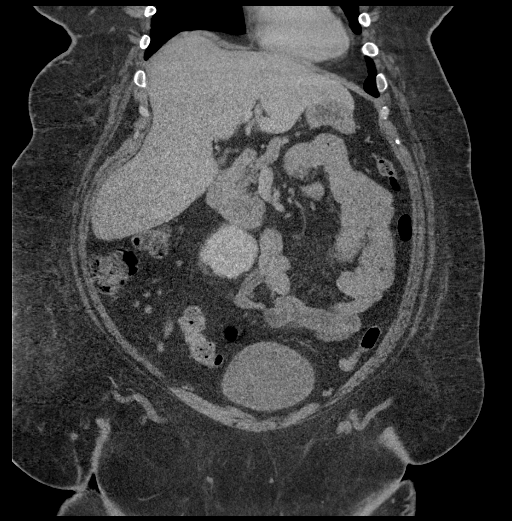
[im 97/174  soft-tissue]
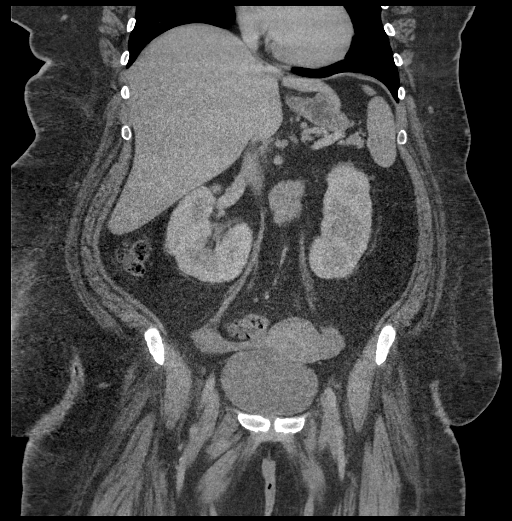

[17 of 46 positions shown; findings below may reference images not displayed]

FINDINGS: Lower chest: Lung bases demonstrate no acute consolidation or
pleural effusion. Minimal atelectasis medial right lung base. Heart
size is nonenlarged.

Hepatobiliary: No focal liver abnormality is seen. No gallstones,
gallbladder wall thickening, or biliary dilatation. Liver slightly
enlarged at 20 cm.

Pancreas: Unremarkable. No pancreatic ductal dilatation or
surrounding inflammatory changes.

Spleen: Normal in size without focal abnormality.

Adrenals/Urinary Tract: Stable 2.3 cm low-density mass in the right
adrenal gland, consistent with adenoma.Left adrenal gland is normal.
Kidneys show no hydronephrosis or focal mass lesion. The bladder is
unremarkable.

Stomach/Bowel: Stomach is within normal limits. Appendix appears
normal. No evidence of bowel wall thickening, distention, or
inflammatory changes.

Vascular/Lymphatic: Aortic atherosclerosis. No enlarged abdominal or
pelvic lymph nodes.

Reproductive: Uterus and bilateral adnexa are unremarkable.

Other: No free air or free fluid. Scar within the midline anterior
abdominal wall.

Musculoskeletal: No acute or significant osseous findings.
IMPRESSION: 1. No CT evidence for acute intra- abdominal or pelvic pathology
2. Stable right adrenal gland adenoma
3. Mildly enlarged liver

## 2018-01-22 NOTE — Progress Notes (Signed)
Belinda, Hall ([id number]) Visit Report for 01/18/2018 Debridement Details Patient Name: [name], [name] Date of Service: 01/18/2018 2:30 PM Medical Record Number: [id number] Patient Account Number: [id number] Date of Birth/Sex: [birth year] (42 y.o. Female) Treating RN: Carolyne Fiscal, Debi Primary Care Provider: [name] Other Clinician: Referring Provider: [name] Treating Provider/Extender: Tito Dine in Treatment: 4 Debridement Performed for Wound #1 Left Calcaneus Assessment: Performed By: Physician Ricard Dillon, MD Debridement: Debridement Severity of Tissue Pre Fat layer exposed Debridement: Pre-procedure Verification/Time Yes - 15:19 Out Taken: Start Time: 15:22 Pain Control: Lidocaine 4% Topical Solution Level: Skin/Subcutaneous Tissue Total Area Debrided (L x W): 0.6 (cm) x 0.4 (cm) = 0.24 (cm) Tissue and other material Viable, Non-Viable, Exudate, Fibrin/Slough, Subcutaneous debrided: Instrument: Curette Bleeding: Minimum Hemostasis Achieved: Pressure End Time: 15:25 Procedural Pain: 0 Post Procedural Pain: 0 Response to Treatment: Procedure was tolerated well Post Debridement Measurements of Total Wound Length: (cm) 0.6 Width: (cm) 0.4 Depth: (cm) 0.4 Volume: (cm) 0.075 Character of Wound/Ulcer Post Debridement: Requires Further Debridement Severity of Tissue Post Debridement: Fat layer exposed Post Procedure Diagnosis Same as Pre-procedure Electronic Signature(s) Signed: 01/18/2018 4:45:41 PM By: Linton Ham MD Signed: 01/20/2018 4:55:35 PM By: Alric Quan Entered By: Linton Ham on 01/18/2018 16:17:59 [name] ([id number]) -------------------------------------------------------------------------------- Debridement Details Patient Name: [name] Date of Service: 01/18/2018 2:30 PM Medical Record Number: [id number] Patient Account Number: [id number] Date of Birth/Sex: 1976/04/07 (42 y.o. Female) Treating RN:  Carolyne Fiscal, Debi Primary Care Provider: [name] Other Clinician: Referring Provider: [name] Treating Provider/Extender: Tito Dine in Treatment: 4 Debridement Performed for Wound #2 Left,Lateral Metatarsal head fifth Assessment: Performed By: Physician Ricard Dillon, MD Debridement: Debridement Severity of Tissue Pre Fat layer exposed Debridement: Pre-procedure Verification/Time Yes - 15:19 Out Taken: Start Time: 15:20 Pain Control: Lidocaine 4% Topical Solution Level: Skin/Subcutaneous Tissue Total Area Debrided (L x W): 1.2 (cm) x 0.8 (cm) = 0.96 (cm) Tissue and other material Viable, Non-Viable, Exudate, Fibrin/Slough, Subcutaneous debrided: Instrument: Curette Bleeding: Minimum Hemostasis Achieved: Pressure End Time: 15:22 Procedural Pain: 0 Post Procedural Pain: 0 Response to Treatment: Procedure was tolerated well Post Debridement Measurements of Total Wound Length: (cm) 1.2 Width: (cm) 0.8 Depth: (cm) 0.3 Volume: (cm) 0.226 Character of Wound/Ulcer Post Debridement: Requires Further Debridement Severity of Tissue Post Debridement: Fat layer exposed Post Procedure Diagnosis Same as Pre-procedure Electronic Signature(s) Signed: 01/18/2018 4:45:41 PM By: Linton Ham MD Signed: 01/20/2018 4:55:35 PM By: Alric Quan Entered By: Linton Ham on 01/18/2018 16:18:10 [name] ([id number]) -------------------------------------------------------------------------------- HPI Details Patient Name: [name] Date of Service: 01/18/2018 2:30 PM Medical Record Number: [id number] Patient Account Number: [id number] Date of Birth/Sex: 02-10-1976 (42 y.o. Female) Treating RN: Carolyne Fiscal, Debi Primary Care Provider: [name] Other Clinician: Referring Provider: [name] Treating Provider/Extender: Tito Dine in Treatment: 4 History of Present Illness Associated Signs and Symptoms: Patient  has a history of diabetes mellitus type II, diabetic neuropathy, and hypertension. HPI Description: 12/16/17 patient presents today for evaluation concerning her left heel ulcer which has been present since June 2018. This occurred after she was outside knowing in her shoes with no socks. At this point in time she has been on Levaquin since November due to a significant infection which did wind up with her in the hospital. She is diabetic and unfortunately her diabetes is not well controlled with a hemoglobin A-1 C of 13.0 last evaluated on 10/29/17. Patient is also medication for high blood pressure. She did have an MRI of  her left ankle with and without contrast which was performed on 10/29/17 and this showed skin ulcerations on the field with mild appearing underlying cellulitis. At that point it was negative for abscess, osteomyelitis, or septic joint. There was also mild appearing plantar fasciitis noted. Patient notes that this is a very tender spot on her heel at this point. Fortunately she does not seem to be having any significant sign of infection at this point she is still on the Levaquin. She has previously been treated by her podiatrist. Her pain is rated to be a 6-7/10 worse at times with palpation or cleansing over the area. 01/11/18; the patient has not been here in almost a month. She presented with a left heel ulcer. She is a type II diabetic with neuropathy. MRI did not suggest osteomyelitis she's been packing the area with Endoform which is on the posterior left heel. She states that about 3 or 4 days ago she noted a change in skin color over the left fifth metatarsal head laterally she arrives today with erythema in this area with necrotic skin over the surface. 01/18/18; culture I did last week of the open area on the left fifth metatarsal head last week grew Serratia and group B strep. Doxycycline would not cover this and I started on Cipro on 2/11. She is still having some pain in  the area but I think things are feeling better. She will definitely need an x-ray repeated. Electronic Signature(s) Signed: 01/18/2018 4:45:41 PM By: Linton Ham MD Entered By: Linton Ham on 01/18/2018 16:19:58 [name] ([id number]) -------------------------------------------------------------------------------- Physical Exam Details Patient Name: [name] Date of Service: 01/18/2018 2:30 PM Medical Record Number: [id number] Patient Account Number: [id number] Date of Birth/Sex: 01/25/1976 (42 y.o. Female) Treating RN: Carolyne Fiscal, Debi Primary Care Provider: [name] Other Clinician: Referring Provider: [name] Treating Provider/Extender: Ricard Dillon Weeks in Treatment: 4 Constitutional Sitting or standing Blood Pressure is within target range for patient.. Pulse regular and within target range for patient.Marland Kitchen Respirations regular, non-labored and within target range.. Temperature is normal and within the target range for the patient.Marland Kitchen appears in no distress. Cardiovascular Normal on the left. Notes Wound exam; the original area on her plantar Achilles heel looks better. Still necrotic debris over the surface of very small wounds now. Using a #3 curet this was removed base of the wounds looks healthier here. Hemostasis with silver nitrate oThe new area from last week on the lateral aspect of the left fifth metatarsal head still has a nonviable surface. Very adherent necrotic subcutaneous tissue. This was debrided with a #3 curet to a better looking granulated surface. There is still some depth here. Surrounding erythema is improved however. There is no palpable bone Electronic Signature(s) Signed: 01/18/2018 4:45:41 PM By: Linton Ham MD Entered By: Linton Ham on 01/18/2018 16:21:38 JEANEEN, CALA ([id number]) -------------------------------------------------------------------------------- Physician Orders Details Patient Name: [name] Date of Service: 01/18/2018 2:30 PM Medical Record Number: [id number] Patient Account Number: [id number] Date of Birth/Sex: 1976-11-15 (42 y.o. Female) Treating RN: Carolyne Fiscal, Debi Primary Care Provider: [name] Other Clinician: Referring Provider: [name] Treating Provider/Extender: Tito Dine in Treatment: 4 Verbal / Phone Orders: Yes Clinician: Pinkerton, Debi Read Back and Verified: Yes Diagnosis Coding Wound Cleansing Wound #1 Left Calcaneus o Clean wound with Normal Saline. o Cleanse wound with mild soap and water o May Shower, gently pat wound dry prior to applying new dressing. Wound #2 Left,Lateral Metatarsal head fifth o Clean wound with Normal Saline.   o Cleanse wound with mild soap and water o May Shower, gently pat wound dry prior to applying new dressing. Anesthetic (add to Medication List) Wound #1 Left Calcaneus o Topical Lidocaine 4% cream applied to wound bed prior to debridement (In Clinic Only). Wound #2 Left,Lateral Metatarsal head fifth o Topical Lidocaine 4% cream applied to wound bed prior to debridement (In Clinic Only). Primary Wound Dressing Wound #1 Left Calcaneus o Silvercel Non-Adherent Wound #2 Left,Lateral Metatarsal head fifth o Silvercel Non-Adherent Secondary Dressing Wound #1 Left Calcaneus o Dry Gauze o Boardered Foam Dressing - or telfa island dressing Wound #2 Left,Lateral Metatarsal head fifth o Dry Gauze o Boardered Foam Dressing - or telfa island dressing Dressing Change Frequency Wound #1 Left Calcaneus o Change dressing every other day. Wound #2 Left,Lateral Metatarsal head fifth o Change dressing every other day. Follow-up Appointments Wound #1 Left Calcaneus BABE, ANTHIS ([id number]) o Return Appointment in 1 week. Wound #2 Left,Lateral Metatarsal head fifth o Return Appointment in 1 week. Edema Control Wound #1 Left Calcaneus o Elevate legs to the  level of the heart and pump ankles as often as possible Wound #2 Left,Lateral Metatarsal head fifth o Elevate legs to the level of the heart and pump ankles as often as possible Additional Orders / Instructions Wound #1 Left Calcaneus o Vitamin A; Vitamin C, Zinc - Please take these over the counter supplements o Increase protein intake. Wound #2 Left,Lateral Metatarsal head fifth o Vitamin A; Vitamin C, Zinc - Please take these over the counter supplements o Increase protein intake. Radiology o X-ray, foot - foot Patient Medications Allergies: Nubain Notifications Medication Indication Start End lidocaine DOSE 1 - topical 4 % cream - 1 cream topical Cipro wound infection 01/18/2018 DOSE oral 500 mg tablet - 1tablet oral bid for an ADDITIONAL week Electronic Signature(s) Signed: 01/18/2018 4:25:51 PM By: Linton Ham MD Entered By: Linton Ham on 01/18/2018 16:25:51 [name] ([id number]) -------------------------------------------------------------------------------- Prescription 01/18/2018 Patient Name: [name] Provider: Ricard Dillon MD Date of Birth: 1976-07-04 NPI#: [id number] Sex: F DEA#: [id number] Phone #: [phone] License #: [id number] Patient Address: [place], [place] [address] [address], [place],  [address] [phone] Allergies Nubain Medication Medication: Route: Strength: Form: lidocaine 4 % topical cream topical 4% cream Class: TOPICAL LOCAL ANESTHETICS Dose: Frequency / Time: Indication: 1 1 cream topical Number of Refills: Number of Units: 0 Generic Substitution: Start Date: End Date: One Time Use: Substitution Permitted No Note to Pharmacy: Signature(s): Date(s): Electronic Signature(s) Signed: 01/18/2018 4:45:41 PM By: Linton Ham MD Entered By: Linton Ham on 01/18/2018 16:25:52 [name]  ([id number]) --------------------------------------------------------------------------------  Problem List Details Patient Name: [name] Date of Service: 01/18/2018 2:30 PM Medical Record Number: [id number] Patient Account Number: [id number] Date of Birth/Sex: [birth year] (42 y.o. 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/16/2017 Yes exposed [place] (primary) hypertension 12/16/2017 Yes E11.40 Type 2 diabetes mellitus with diabetic neuropathy, unspecified 12/16/2017 Yes Inactive Problems Resolved Problems Electronic Signature(s) Signed: 01/18/2018 4:45:41 PM By: Linton Ham MD Entered By: Linton Ham on 01/18/2018 16:17:33 [name] ([id number]) -------------------------------------------------------------------------------- Progress Note Details Patient Name: [name] Date of Service:  01/18/2018 2:30 PM Medical Record Number: [id number] Patient Account Number: [id number] Date of Birth/Sex: Nov 05, 1976 (41 y.o. Female) Treating RN: Carolyne Fiscal, Debi Primary Care Provider: [name] Other Clinician: Referring Provider: [name] Treating Provider/Extender: Tito Dine in Treatment: 4 Subjective History of Present Illness (HPI) The following HPI elements were documented for the patient's wound: Associated Signs and Symptoms: Patient has a history of diabetes mellitus type II, diabetic neuropathy, and hypertension. 12/16/17 patient presents today for evaluation concerning her left heel ulcer which has been present since June 2018. This occurred after she was outside  knowing in her shoes with no socks. At this point in time she has been on Levaquin since November due to a significant infection which did wind up with her in the hospital. She is diabetic and unfortunately her diabetes is not well controlled with a hemoglobin A-1 C of 13.0 last evaluated on 10/29/17. Patient is also medication for high blood pressure. She did have an MRI of her left ankle with and without contrast which was performed on 10/29/17 and this showed skin ulcerations on the field with mild appearing underlying cellulitis. At that point it was negative for abscess, osteomyelitis, or septic joint. There was also mild appearing plantar fasciitis noted. Patient notes that this is a very tender spot on her heel at this point. Fortunately she does not seem to be having any significant sign of infection at this point she is still on the Levaquin. She has previously been treated by her podiatrist. Her pain is rated to be a 6-7/10 worse at times with palpation or cleansing over the area. 01/11/18; the patient has not been here in almost a month. She presented with a left heel ulcer. She is a type II diabetic with neuropathy. MRI did not suggest osteomyelitis she's been packing the area with Endoform which is on the posterior left heel. She states that about 3 or 4 days ago she noted a change in skin color over the left fifth metatarsal head laterally she arrives today with erythema in this area with necrotic skin over the surface. 01/18/18; culture I did last week of the open area on the left fifth metatarsal head last week grew Serratia and group B strep. Doxycycline would not cover this and I started on Cipro on 2/11. She is still having some pain in the area but I think things are feeling better. She will definitely need an x-ray repeated. Objective Constitutional Sitting or standing Blood Pressure is within target range for patient.. Pulse regular and within target range for  patient.Marland Kitchen Respirations regular, non-labored and within target range.. Temperature is normal and within the target range for the patient.Marland Kitchen appears in no distress. Vitals Time Taken: 2:57 PM, Height: 61 in, Weight: 304 lbs, BMI: 57.4, Temperature: 97.8 F, Pulse: 83 bpm, Respiratory Rate: 18 breaths/min, Blood Pressure: 114/67 mmHg. Cardiovascular Normal on the left. General Notes: Wound exam; the original area on her plantar Achilles heel looks better. Still necrotic debris over the surface of very small wounds now. Using a #3 curet this was removed base of the wounds looks healthier here. Hemostasis with [name], Nathaniel ([id number]) silver nitrate The new area from last week on the lateral aspect of the left fifth metatarsal head still has a nonviable surface. Very adherent necrotic subcutaneous tissue. This was debrided with a #3 curet to a better looking granulated surface. There is still some depth here. Surrounding erythema is improved however. There is no palpable bone Integumentary (Hair, Skin) Wound #1  status is Open. Original cause of wound was Blister. The wound is located on the Left Calcaneus. The wound measures 0.6cm length x 0.4cm width x 0.3cm depth; 0.188cm^2 area and 0.057cm^3 volume. There is Fat Layer (Subcutaneous Tissue) Exposed exposed. There is no tunneling or undermining noted. There is a medium amount of serous drainage noted. The wound margin is flat and intact. There is medium (34-66%) pink granulation within the wound bed. There is a medium (34-66%) amount of necrotic tissue within the wound bed including Eschar and Adherent Slough. The periwound skin appearance exhibited: Callus, Erythema. The periwound skin appearance did not exhibit: Crepitus, Excoriation, Induration, Rash, Scarring, Dry/Scaly, Maceration, Atrophie Blanche, Cyanosis, Ecchymosis, Hemosiderin Staining, Mottled, Pallor, Rubor. The surrounding wound skin color is noted with erythema which is  circumferential. Periwound temperature was noted as No Abnormality. The periwound has tenderness on palpation. Wound #2 status is Open. Original cause of wound was Gradually Appeared. The wound is located on the Left,Lateral Metatarsal head fifth. The wound measures 1.2cm length x 0.8cm width x 0.2cm depth; 0.754cm^2 area and 0.151cm^3 volume. There is no tunneling or undermining noted. There is a large amount of serous drainage noted. The wound margin is flat and intact. There is no granulation within the wound bed. There is a large (67-100%) amount of necrotic tissue within the wound bed including Eschar and Adherent Slough. The periwound skin appearance exhibited: Maceration, Erythema. The periwound skin appearance did not exhibit: Callus, Crepitus, Excoriation, Induration, Rash, Scarring, Dry/Scaly, Atrophie Blanche, Cyanosis, Ecchymosis, Hemosiderin Staining, Mottled, Pallor, Rubor. The surrounding wound skin color is noted with erythema which is circumferential. Periwound temperature was noted as No Abnormality. The periwound has tenderness on palpation. 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 There was a Skin/Subcutaneous Tissue Debridement ([id number]) debridement with total area of 0.24 sq cm performed by Ricard Dillon, MD. with the following instrument(s): Curette to remove Viable and Non-Viable tissue/material including Exudate, Fibrin/Slough, and Subcutaneous after achieving pain control using Lidocaine 4% Topical Solution. A time out was conducted  at 15:19, prior to the start of the procedure. A Minimum amount of bleeding was controlled with Pressure. The procedure was tolerated well with a pain level of 0 throughout and a pain level of 0 following the procedure. Post Debridement Measurements: 0.6cm length x 0.4cm width x 0.4cm depth; 0.075cm^3 volume. Character of Wound/Ulcer Post Debridement requires further debridement. Severity of Tissue Post Debridement is: Fat layer exposed. Post procedure Diagnosis Wound #1: Same as Pre-Procedure [name], [place]i ([id number]) Wound #2 Pre-procedure diagnosis of Wound #2 is a Diabetic Wound/Ulcer of the Lower Extremity located on the Left,Lateral Metatarsal head fifth .Severity of Tissue Pre Debridement is: Fat layer exposed. There was a Skin/Subcutaneous Tissue Debridement ([id number]) debridement with total area of 0.96 sq cm performed by Ricard Dillon, MD. with the following instrument(s): Curette to remove Viable and Non-Viable tissue/material including Exudate, Fibrin/Slough, and Subcutaneous after achieving pain control using Lidocaine 4% Topical Solution. A time out was conducted at 15:19, prior to the start of the procedure. A Minimum amount of bleeding was controlled with Pressure. The procedure was tolerated well with  a pain level of 0 throughout and a pain level of 0 following the procedure. Post Debridement Measurements: 1.2cm length x 0.8cm width x 0.3cm depth; 0.226cm^3 volume. Character of Wound/Ulcer Post Debridement requires further debridement. Severity of Tissue Post Debridement is: Fat layer exposed. Post procedure Diagnosis Wound #2: Same as Pre-Procedure Plan Wound Cleansing: Wound #1 Left Calcaneus: Clean wound with Normal Saline. Cleanse wound with mild soap and water May Shower, gently pat wound dry prior to applying new dressing. Wound #2 Left,Lateral Metatarsal head fifth: Clean wound with Normal Saline. Cleanse wound with mild soap and water May Shower, gently pat  wound dry prior to applying new dressing. Anesthetic (add to Medication List): Wound #1 Left Calcaneus: Topical Lidocaine 4% cream applied to wound bed prior to debridement (In Clinic Only). Wound #2 Left,Lateral Metatarsal head fifth: Topical Lidocaine 4% cream applied to wound bed prior to debridement (In Clinic Only). Primary Wound Dressing: Wound #1 Left Calcaneus: Silvercel Non-Adherent Wound #2 Left,Lateral Metatarsal head fifth: [place] Non-Adherent Secondary Dressing: Wound #1 Left Calcaneus: Dry Gauze Boardered Foam Dressing - or telfa island dressing Wound #2 Left,Lateral Metatarsal head fifth: Dry Gauze Boardered Foam Dressing - or telfa island dressing Dressing Change Frequency: Wound #1 Left Calcaneus: Change dressing every other day. Wound #2 Left,Lateral Metatarsal head fifth: Change dressing every other day. Follow-up Appointments: Wound #1 Left Calcaneus: Return Appointment in 1 week. Wound #2 Left,Lateral Metatarsal head fifth: Return Appointment in 1 week. Edema Control: Wound #1 Left Calcaneus: [name], [place] ([id number]) Elevate legs to the level of the heart and pump ankles as often as possible Wound #2 Left,Lateral Metatarsal head fifth: Elevate legs to the level of the heart and pump ankles as often as possible Additional Orders / Instructions: Wound #1 Left Calcaneus: Vitamin A; Vitamin C, Zinc - Please take these over the counter supplements Increase protein intake. Wound #2 Left,Lateral Metatarsal head fifth: Vitamin A; Vitamin C, Zinc - Please take these over the counter supplements Increase protein intake. Radiology ordered were: X-ray, foot - foot The following medication(s) was prescribed: lidocaine topical 4 % cream 1 1 cream topical was prescribed at facility Cipro oral 500 mg tablet 1tablet oral bid for an ADDITIONAL week for wound infection starting 01/18/2018 extebd cipor for an additional wk silver alginate to both wound areas which  actually look better repeat xray looking at 5th met head Electronic Signature(s) Signed: 01/18/2018 4:45:41 PM By: Linton Ham MD Entered By: Linton Ham on 01/18/2018 16:27:23 [name] ([id number]) -------------------------------------------------------------------------------- SuperBill Details Patient Name: [name] Date of Service: 01/18/2018 Medical Record Number: [id number] Patient Account Number: [id number] Date of Birth/Sex: [birth year] (42 y.o. Female) Treating RN: Carolyne Fiscal, Debi Primary Care Provider: [name] Other Clinician: Referring Provider: [name] Treating Provider/Extender: Tito Dine in Treatment: 4 Diagnosis Coding ICD-10 Codes Code Description E11.621 Type 2 diabetes mellitus with foot ulcer 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 layer exposed I10 Essential (primary) hypertension E11.40 Type 2 diabetes mellitus with diabetic neuropathy, unspecified Facility Procedures CPT4 Code: [id number] Description: 32671 - DEB SUBQ TISSUE 20 SQ CM/< ICD-10 Diagnosis Description E11.621 Type 2 diabetes mellitus with foot ulcer 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

## 2018-01-22 NOTE — Progress Notes (Signed)
HIILEI, GERST ([id number]) Visit Report for 01/18/2018 Arrival Information Details Patient Name: [name], [name] Date of Service: 01/18/2018 2:30 PM Medical Record Number: [id number] Patient Account Number: [id number] Date of Birth/Sex: 10-05-1976 (42 y.o. Female) Treating RN: Carolyne Fiscal, Debi Primary Care Ardena Gangl: [name] Other Clinician: Referring Matsuko Kretz: [name] Treating Bayard More/Extender: Tito Dine in Treatment: 4 Visit Information History Since Last Visit All ordered tests and consults were completed: No Patient Arrived: Ambulatory Added or deleted any medications: No Arrival Time: 14:54 Any new allergies or adverse reactions: No Accompanied By: self Had a fall or experienced change in No Transfer Assistance: None activities of daily living that may affect Patient Identification Verified: Yes risk of falls: Secondary Verification Process Completed: Yes Signs or symptoms of abuse/neglect since last visito No Patient Requires Transmission-Based No Hospitalized since last visit: No Precautions: Has Dressing in Place as Prescribed: Yes Patient Has Alerts: Yes Pain Present Now: Yes Patient Alerts: DMII Electronic Signature(s) Signed: 01/20/2018 4:55:35 PM By: Alric Quan Entered By: Alric Quan on 01/18/2018 14:55:36 [name] ([id number]) -------------------------------------------------------------------------------- Encounter Discharge Information Details Patient Name: [name] Date of Service: 01/18/2018 2:30 PM Medical Record Number: [id number] Patient Account Number: [id number] Date of Birth/Sex: July 30, 1976 (42 y.o. Female) Treating RN: Carolyne Fiscal, Debi Primary Care Maryiah Olvey: [name] Other Clinician: Referring Margeaux Swantek: [name] Treating Enslee Bibbins/Extender: Tito Dine in Treatment: 4 Encounter Discharge Information Items Discharge Pain Level: 0 Discharge Condition: Stable Ambulatory Status:  Ambulatory Discharge Destination: Home Transportation: Private Auto Accompanied By: self Schedule Follow-up Appointment: Yes Medication Reconciliation completed and No provided to Patient/Care Kalev Temme: Provided on Clinical Summary of Care: 01/18/2018 Form Type Recipient Paper Patient TB Electronic Signature(s) Signed: 01/18/2018 4:05:31 PM By: Ruthine Dose Entered By: Ruthine Dose on 01/18/2018 15:37:50 [name] ([id number]) -------------------------------------------------------------------------------- Lower Extremity Assessment Details Patient Name: [name] Date of Service: 01/18/2018 2:30 PM Medical Record Number: [id number] Patient Account Number: [id number] Date of Birth/Sex: 1975/12/09 (42 y.o. Female) Treating RN: Carolyne Fiscal, Debi Primary Care Cyenna Rebello: [name] Other Clinician: Referring Torris House: [name] Treating Kyren Knick/Extender: Tito Dine in Treatment: 4 Vascular Assessment Pulses: Dorsalis Pedis Palpable: [Left:Yes] Posterior Tibial Extremity colors, hair growth, and conditions: Extremity Color: [Left:Normal] Temperature of Extremity: [Left:Warm] Capillary Refill: [Left:< 3 seconds] Toe Nail Assessment Left: Right: Thick: No Discolored: No Deformed: No Improper Length and Hygiene: No Electronic Signature(s) Signed: 01/20/2018 4:55:35 PM By: Alric Quan Entered By: Alric Quan on 01/18/2018 15:07:42 [name] ([id number]) -------------------------------------------------------------------------------- Multi Wound Chart Details Patient Name: [name] Date of Service: 01/18/2018 2:30 PM Medical Record Number: [id number] Patient Account Number: [id number] Date of Birth/Sex: 1976-10-04 (42 y.o. Female) Treating RN: Carolyne Fiscal, Debi Primary Care Daisy Lites: [name] Other Clinician: Referring Hollynn Garno: [name] Treating Aroldo Galli/Extender: Tito Dine in Treatment: 4 Vital  Signs Height(in): 61 Pulse(bpm): 35 Weight(lbs): 304 Blood Pressure(mmHg): 114/67 Body Mass Index(BMI): 71 Temperature(F): 97.8 Respiratory Rate 18 (breaths/min): Photos: [1:No Photos] [2:No Photos] [N/A:N/A] Wound Location: [1:Left Calcaneus] [2:Left Metatarsal head fifth - Lateral] [N/A:N/A] Wounding Event: [1:Blister] [2:Gradually Appeared] [N/A:N/A] Primary Etiology: [1:Diabetic Wound/Ulcer of the Diabetic Wound/Ulcer of the Lower Extremity] [2:Lower Extremity] [N/A:N/A] Comorbid History: [1:Asthma, Hypertension, Type II Asthma, Hypertension, Type II Diabetes, Osteoarthritis, Neuropathy] [2:Diabetes, Osteoarthritis, Neuropathy] [N/A:N/A] Date Acquired: [1:10/03/2017] [2:01/02/2018] [N/A:N/A] Weeks of Treatment: [1:4] [2:1] [N/A:N/A] Wound Status: [1:Open] [2:Open] [N/A:N/A] Pending Amputation on [1:Yes] [2:No] [N/A:N/A] Presentation: Measurements L x W x D [1:0.6x0.4x0.3] [2:1.2x0.8x0.2] [N/A:N/A] (cm) Area (cm) : [1:0.188] [2:0.754] [N/A:N/A] Volume (cm) : [1:0.057] [2:0.151] [N/A:N/A] % Reduction in Area: [  1:0.00%] [2:80.80%] [N/A:N/A] % Reduction in Volume: [1:39.40%] [2:80.80%] [N/A:N/A] Classification: [1:Grade 2] [2:Grade 2] [N/A:N/A] Exudate Amount: [1:Medium] [2:Large] [N/A:N/A] Exudate Type: [1:Serous] [2:Serous] [N/A:N/A] Exudate Color: [1:amber] [2:amber] [N/A:N/A] Wound Margin: [1:Flat and Intact] [2:Flat and Intact] [N/A:N/A] Granulation Amount: [1:Medium (34-66%)] [2:None Present (0%)] [N/A:N/A] Granulation Quality: [1:Pink] [2:N/A] [N/A:N/A] Necrotic Amount: [1:Medium (34-66%)] [2:Large (67-100%)] [N/A:N/A] Necrotic Tissue: [1:Eschar, Adherent Slough] [2:Eschar, Adherent Slough] [N/A:N/A] Exposed Structures: [1:Fat Layer (Subcutaneous Tissue) Exposed: Yes Fascia: No Tendon: No Muscle: No Joint: No Bone: No] [2:Fascia: No Fat Layer (Subcutaneous Tissue) Exposed: No Tendon: No Muscle: No Joint: No Bone: No] [N/A:N/A] Epithelialization: [1:None] [2:None]  [N/A:N/A] Debridement: Debridement ([id number]) Debridement ([id number]) N/A Pre-procedure 15:19 15:19 N/A Verification/Time Out Taken: Pain Control: Lidocaine 4% Topical Solution Lidocaine 4% Topical Solution N/A Tissue Debrided: Fibrin/Slough, Exudates, Fibrin/Slough, Exudates, N/A Subcutaneous Subcutaneous Level: Skin/Subcutaneous Tissue Skin/Subcutaneous Tissue N/A Debridement Area (sq cm): 0.24 0.96 N/A Instrument: Curette Curette N/A Bleeding: Minimum Minimum N/A Hemostasis Achieved: Pressure Pressure N/A Procedural Pain: 0 0 N/A Post Procedural Pain: 0 0 N/A Debridement Treatment Procedure was tolerated well Procedure was tolerated well N/A Response: Post Debridement 0.6x0.4x0.4 1.2x0.8x0.3 N/A Measurements L x W x D (cm) Post Debridement Volume: 0.075 0.226 N/A (cm) Periwound Skin Texture: Callus: Yes Excoriation: No N/A Excoriation: No Induration: No Induration: No Callus: No Crepitus: No Crepitus: No Rash: No Rash: No Scarring: No Scarring: No Periwound Skin Moisture: Maceration: No Maceration: Yes N/A Dry/Scaly: No Dry/Scaly: No Periwound Skin Color: Erythema: Yes Erythema: Yes N/A Atrophie Blanche: No Atrophie Blanche: No Cyanosis: No Cyanosis: No Ecchymosis: No Ecchymosis: No Hemosiderin Staining: No Hemosiderin Staining: No Mottled: No Mottled: No Pallor: No Pallor: No Rubor: No Rubor: No Erythema Location: Circumferential Circumferential N/A Temperature: No Abnormality No Abnormality N/A Tenderness on Palpation: Yes Yes N/A Wound Preparation: Ulcer Cleansing: Ulcer Cleansing: N/A Rinsed/Irrigated with Saline Rinsed/Irrigated with Saline Topical Anesthetic Applied: Topical Anesthetic Applied: Other: lidocaine 4% Other: lidocaine 4% Procedures Performed: Debridement Debridement N/A Treatment Notes Wound #1 (Left Calcaneus) 1. Cleansed with: Clean wound with Normal Saline 2. Anesthetic Topical Lidocaine 4% cream to wound bed  prior to debridement 4. Dressing Applied: Other dressing (specify in notes) 5. Secondary [place] [name], [place] ([id number]) Notes silvercel Wound #2 (Left, Lateral Metatarsal head fifth) 1. Cleansed with: Clean wound with Normal Saline 2. Anesthetic Topical Lidocaine 4% cream to wound bed prior to debridement 4. Dressing Applied: Other dressing (specify in notes) 5. Secondary [place] Notes silvercel Electronic Signature(s) Signed: 01/18/2018 4:45:41 PM By: Linton Ham MD Entered By: Linton Ham on 01/18/2018 16:17:48 [name], [name] ([id number]) -------------------------------------------------------------------------------- Multi-Disciplinary Care Plan Details Patient Name: [name] Date of Service: 01/18/2018 2:30 PM Medical Record Number: [id number] Patient Account Number: [id number] Date of Birth/Sex: 06/07/1976 (41 y.o. Female) Treating RN: Carolyne Fiscal, Debi Primary Care Deisha Stull: [name] Other Clinician: Referring Keghan Mcfarren: [name] Treating Vanessa Kampf/Extender: Tito Dine in Treatment: 4 Active Inactive ` Abuse / Safety / Falls / Self Care Management Nursing Diagnoses: Impaired physical mobility Goals: Patient will not experience any injury related to falls Date Initiated: 12/16/2017 Target Resolution Date: 03/11/2018 Goal Status: Active Interventions: Assess fall risk on admission and as needed Notes: ` Orientation to the Wound Care Program Nursing Diagnoses: Knowledge deficit related to the wound healing center program Goals: Patient/caregiver will verbalize understanding of the [place] Date Initiated: 12/16/2017 Target Resolution Date: 03/11/2018 Goal Status: Active Interventions: Provide education on orientation to the wound center Notes: ` Wound/Skin Impairment Nursing  Diagnoses: Impaired tissue integrity Goals: Ulcer/skin breakdown will heal within 14  weeks Date Initiated: 12/16/2017 Target Resolution Date: 03/11/2018 Goal Status: Active Interventions: CONSUELA, WIDENER ([id number]) Assess patient/caregiver ability to obtain necessary supplies Assess patient/caregiver ability to perform ulcer/skin care regimen upon admission and as needed Assess ulceration(s) every visit Notes: Electronic Signature(s) Signed: 01/20/2018 4:55:35 PM By: Alric Quan Entered By: Alric Quan on 01/18/2018 15:08:12 [name] ([id number]) -------------------------------------------------------------------------------- Pain Assessment Details Patient Name: [name] Date of Service: 01/18/2018 2:30 PM Medical Record Number: [id number] Patient Account Number: [id number] Date of Birth/Sex: 02/04/1976 (42 y.o. Female) Treating RN: Carolyne Fiscal, Debi Primary Care Lulani Bour: [name] Other Clinician: Referring Rodrigo Mcgranahan: [name] Treating Gailyn Crook/Extender: Tito Dine in Treatment: 4 Active Problems Location of Pain Severity and Description of Pain Patient Has Paino Yes Site Locations Rate the pain. Current Pain Level: 7 Character of Pain Describe the Pain: Throbbing Pain Management and Medication Current Pain Management: Notes Topical or injectable lidocaine is offered to patient for acute pain when surgical debridement is performed. If needed, Patient is instructed to use over the counter pain medication for the following 24-48 hours after debridement. Wound care MDs do not prescribed pain medications. Patient has chronic pain or uncontrolled pain. Patient has been instructed to make an appointment with their Primary Care Physician for pain management. Electronic Signature(s) Signed: 01/20/2018 4:55:35 PM By: Alric Quan Entered By: Alric Quan on 01/18/2018 14:56:09 [name] ([id number]) -------------------------------------------------------------------------------- Patient/Caregiver Education  Details Patient Name: [name] Date of Service: 01/18/2018 2:30 PM Medical Record Number: [id number] Patient Account Number: [id number] Date of Birth/Gender: [birth year] (42 y.o. Female) Treating RN: Carolyne Fiscal, Debi Primary Care Physician: [name] Other Clinician: Referring Physician: [name] Treating Physician/Extender: Tito Dine in Treatment: 4 Education Assessment Education Provided To: Patient Education Topics Provided Wound/Skin Impairment: Handouts: Caring for Your Ulcer, Other: change dressing as ordered Methods: Demonstration, Explain/Verbal Responses: State content correctly Electronic Signature(s) Signed: 01/20/2018 4:55:35 PM By: Alric Quan Entered By: Alric Quan on 01/18/2018 15:37:34 [name], [name] ([id number]) -------------------------------------------------------------------------------- Wound Assessment Details Patient Name: [name] Date of Service: 01/18/2018 2:30 PM Medical Record Number: [id number] Patient Account Number: [id number] Date of Birth/Sex: May 07, 1976 (42 y.o. Female) Treating RN: Carolyne Fiscal, Debi Primary Care Amarissa Koerner: [name] Other Clinician: Referring Kaamil Morefield: [name] Treating Cimone Fahey/Extender: Tito Dine in Treatment: 4 Wound Status Wound Number: 1 Primary Diabetic Wound/Ulcer of the Lower Extremity Etiology: Wound Location: Left Calcaneus Wound Open Wounding Event: Blister Status: Date Acquired: 10/03/2017 Comorbid Asthma, Hypertension, Type II Diabetes, Weeks Of Treatment: 4 History: Osteoarthritis, Neuropathy Clustered Wound: No Pending Amputation On Presentation Photos Photo Uploaded By: Alric Quan on 01/19/2018 13:23:04 Wound Measurements Length: (cm) 0.6 Width: (cm) 0.4 Depth: (cm) 0.3 Area: (cm) 0.188 Volume: (cm) 0.057 % Reduction in Area: 0% % Reduction in Volume: 39.4% Epithelialization: None Tunneling: No Undermining:  No Wound Description Classification: Grade 2 Wound Margin: Flat and Intact Exudate Amount: Medium Exudate Type: Serous Exudate Color: amber Foul Odor After Cleansing: No Slough/Fibrino Yes Wound Bed Granulation Amount: Medium (34-66%) Exposed Structure Granulation Quality: Pink Fascia Exposed: No Necrotic Amount: Medium (34-66%) Fat Layer (Subcutaneous Tissue) Exposed: Yes Necrotic Quality: Eschar, Adherent Slough Tendon Exposed: No Muscle Exposed: No Joint Exposed: No Bone Exposed: No Periwound Skin Texture [name], [name] ([id number]) Texture Color No Abnormalities Noted: No No Abnormalities Noted: No Callus: Yes Atrophie Blanche: No Crepitus: No Cyanosis: No Excoriation: No Ecchymosis: No Induration: No Erythema: Yes Rash: No Erythema Location: Circumferential Scarring: No Hemosiderin Staining: No  Mottled: No Moisture Pallor: No No Abnormalities Noted: No Rubor: No Dry / Scaly: No Maceration: No Temperature / Pain Temperature: No Abnormality Tenderness on Palpation: Yes Wound Preparation Ulcer Cleansing: Rinsed/Irrigated with Saline Topical Anesthetic Applied: Other: lidocaine 4%, Treatment Notes Wound #1 (Left Calcaneus) 1. Cleansed with: Clean wound with Normal Saline 2. Anesthetic Topical Lidocaine 4% cream to wound bed prior to debridement 4. Dressing Applied: Other dressing (specify in notes) 5. Secondary [place] Notes silvercel Electronic Signature(s) Signed: 01/20/2018 4:55:35 PM By: Alric Quan Entered By: Alric Quan on 01/18/2018 15:04:11 [name] ([id number]) -------------------------------------------------------------------------------- Wound Assessment Details Patient Name: [name] Date of Service: 01/18/2018 2:30 PM Medical Record Number: [id number] Patient Account Number: [id number] Date of Birth/Sex: May 18, 1976 (42 y.o. Female) Treating RN: Carolyne Fiscal, Debi Primary Care Maryalyce Sanjuan: [name] Other Clinician: Referring Hibba Schram: [name] Treating Vance Belcourt/Extender: Tito Dine in Treatment: 4 Wound Status Wound Number: 2 Primary Diabetic Wound/Ulcer of the Lower Extremity Etiology: Wound Location: Left Metatarsal head fifth - Lateral Wound Open Wounding Event: Gradually Appeared Status: Date Acquired: 01/02/2018 Comorbid Asthma, Hypertension, Type II Diabetes, Weeks Of Treatment: 1 History: Osteoarthritis, Neuropathy Clustered Wound: No Photos Photo Uploaded By: Alric Quan on 01/19/2018 13:23:04 Wound Measurements Length: (cm) 1.2 Width: (cm) 0.8 Depth: (cm) 0.2 Area: (cm) 0.754 Volume: (cm) 0.151 % Reduction in Area: 80.8% % Reduction in Volume: 80.8% Epithelialization: None Tunneling: No Undermining: No Wound Description Classification: Grade 2 Wound Margin: Flat and Intact Exudate Amount: Large Exudate Type: Serous Exudate Color: amber Foul Odor After Cleansing: No Slough/Fibrino Yes Wound Bed Granulation Amount: None Present (0%) Exposed Structure Necrotic Amount: Large (67-100%) Fascia Exposed: No Necrotic Quality: Eschar, Adherent Slough Fat Layer (Subcutaneous Tissue) Exposed: No Tendon Exposed: No Muscle Exposed: No Joint Exposed: No Bone Exposed: No Periwound Skin Texture [name], [name] ([id number]) Texture Color No Abnormalities Noted: No No Abnormalities Noted: No Callus: No Atrophie Blanche: No Crepitus: No Cyanosis: No Excoriation: No Ecchymosis: No Induration: No Erythema: Yes Rash: No Erythema Location: Circumferential Scarring: No Hemosiderin Staining: No Mottled: No Moisture Pallor: No No Abnormalities Noted: No Rubor: No Dry / Scaly: No Maceration: Yes Temperature / Pain Temperature: No Abnormality Tenderness on Palpation: Yes Wound Preparation Ulcer Cleansing: Rinsed/Irrigated with Saline Topical Anesthetic Applied: Other: lidocaine 4%, Treatment Notes Wound #2 (Left,  Lateral Metatarsal head fifth) 1. Cleansed with: Clean wound with Normal Saline 2. Anesthetic Topical Lidocaine 4% cream to wound bed prior to debridement 4. Dressing Applied: Other dressing (specify in notes) 5. Secondary [place] Notes silvercel Electronic Signature(s) Signed: 01/20/2018 4:55:35 PM By: Alric Quan Entered By: Alric Quan on 01/18/2018 15:06:15 [name], [name] ([id number]) -------------------------------------------------------------------------------- Vitals Details Patient Name: [name] Date of Service: 01/18/2018 2:30 PM Medical Record Number: [id number] Patient Account Number: [id number] Date of Birth/Sex: Apr 07, 1976 (42 y.o. Female) Treating RN: Carolyne Fiscal, Debi Primary Care Abbagale Goguen: [name] Other Clinician: Referring Dvaughn Fickle: [name] Treating Charell Faulk/Extender: Tito Dine in Treatment: 4 Vital Signs Time Taken: 14:57 Temperature (F): 97.8 Height (in): 61 Pulse (bpm): 83 Weight (lbs): 304 Respiratory Rate (breaths/min): 18 Body Mass Index (BMI): 57.4 Blood Pressure (mmHg): 114/67 Reference Range: 80 - 120 mg / dl Electronic Signature(s) Signed: 01/20/2018 4:55:35 PM By: Alric Quan Entered By: Alric Quan on 01/18/2018 15:00:18

## 2018-01-25 ENCOUNTER — Other Ambulatory Visit: Payer: Self-pay

## 2018-01-25 ENCOUNTER — Encounter: Payer: Self-pay | Admitting: Family Medicine

## 2018-01-25 ENCOUNTER — Ambulatory Visit (INDEPENDENT_AMBULATORY_CARE_PROVIDER_SITE_OTHER): Payer: Medicaid Other | Admitting: Family Medicine

## 2018-01-25 DIAGNOSIS — E119 Type 2 diabetes mellitus without complications: Secondary | ICD-10-CM | POA: Diagnosis not present

## 2018-01-25 DIAGNOSIS — G8929 Other chronic pain: Secondary | ICD-10-CM

## 2018-01-25 DIAGNOSIS — Z794 Long term (current) use of insulin: Secondary | ICD-10-CM

## 2018-01-25 DIAGNOSIS — F329 Major depressive disorder, single episode, unspecified: Secondary | ICD-10-CM | POA: Diagnosis not present

## 2018-01-25 DIAGNOSIS — L98499 Non-pressure chronic ulcer of skin of other sites with unspecified severity: Secondary | ICD-10-CM

## 2018-01-25 DIAGNOSIS — IMO0001 Reserved for inherently not codable concepts without codable children: Secondary | ICD-10-CM

## 2018-01-25 DIAGNOSIS — F32A Depression, unspecified: Secondary | ICD-10-CM

## 2018-01-25 MED ORDER — GABAPENTIN 600 MG PO TABS: 1200.0000 mg | ORAL_TABLET | Freq: Three times a day (TID) | ORAL | 5 refills | Status: DC

## 2018-01-25 MED ORDER — HYDROCODONE-ACETAMINOPHEN 10-325 MG PO TABS: 1.0000 | ORAL_TABLET | Freq: Three times a day (TID) | ORAL | 0 refills | Status: DC | PRN

## 2018-01-25 NOTE — Assessment & Plan Note (Signed)
Not evaluated today.  Patient has follow-up tomorrow with the wound care center.

## 2018-01-25 NOTE — Assessment & Plan Note (Signed)
Increased depressive symptoms lately.  No thoughts of self-harm.  Given her prior diagnosis of bipolar disorder, think she would be best served by being evaluated by psychiatry first.  She was admitted last year with suicide attempt but not discharged on any psychiatric medications at that time.  I've encouraged her to go back and be seen at [facility], which she is willing to do.  Discussed return precautions.

## 2018-01-25 NOTE — Patient Instructions (Signed)
Go see [facility] for your mood. Can also call the Behavioral Medicine center to schedule an appointment. Their phone number is: [phone].  Add novolog 15 units with lunch, continue 25 units with dinner Sent in gabapentin for you  Schedule follow up with Dr. Valentina Lucks  Refilled pain medication for 1 month  See me in 1 month.  Be well, Dr. [name]

## 2018-01-25 NOTE — Progress Notes (Signed)
Date of Visit: 01/25/2018   HPI:  [name] presents for routine follow-up.  Wound of left foot: Has wound on the left fifth toe.  Currently taking a course of ciprofloxacin, and is followed by the wound center and seen by one physician there.  Did not want me to evaluate the foot today.  She has an appointment there tomorrow.  Diabetes: Currently taking Tresiba 105 units daily, NovoLog 25 units with dinner, and Victoza 1.8 mg daily.  Sugars run in the low 100s while fasting, but get up to the high 200s after eating meals.  Agreeable to adding NovoLog with lunch.  Lunch is usually a smaller meal for her than dinner.  Chronic pain: Currently taking Norco 10-325 mg 3 times a day.  She is doing this medication safely.  Denies any unwanted side effects.  The pain medicine helps her pain which is primarily in her knees.  Pain is stable overall.  She has had limited ability to ambulate lately due to the foot ulcer.  Mood issues: Has felt very depressed lately.  PHQ 9 score today is 14 (#9 score of 0)  Denies any thoughts of harming herself or others.  States she plans to go to the hospital after her [birth year] in [birth year] to be admitted and get some help with her mood.  Has been tearful and feels very depressed.  Previously was managed at [facility] with medications for bipolar disorder.  Agreeable with going back to [facility] and seeing them again.  ROS: See HPI.  [place]: history of insulin dependent diabetes, asthma, chronic pain, diabetic neuropathy, hypertension, GERD, hyperlipidemia   PHYSICAL EXAM: BP 136/80   Pulse 83   Temp (!) 97.4 F (36.3 C)   Ht 5\' 2"  (1.575 m)   Wt (!) 319 lb (144.7 kg)   LMP 01/06/2018 (Approximate)   SpO2 95%   BMI 58.35 kg/m  Gen: no acute distress, pleasant, cooperative HEENT: normocephalic, atraumatic, moist mucous membranes  Neuro: alert, grossly nonfocal, speech normal Ext: left foot in brace Psych: normal range of affect though primarily sad/tearful when discussing  mood, well groomed, speech normal in rate and volume, normal eye contact   ASSESSMENT/PLAN:  Health maintenance:  -UTD on health maintenance items  Insulin dependent diabetes mellitus ([place]) Postprandial blood sugar is elevated.  We will add NovoLog 15 units with lunch.  Continue 25 units with dinner, as well as current dose of [place].  Advised patient to schedule an appointment with Dr. Valentina Lucks to f/u on diabetes. Will need A1c at next visit. Our point of care A1c machine is down today.  Depression Increased depressive symptoms lately.  No thoughts of self-harm.  Given her prior diagnosis of bipolar disorder, think she would be best served by being evaluated by psychiatry first.  She was admitted last year with suicide attempt but not discharged on any psychiatric medications at that time.  I've encouraged her to go back and be seen at [facility], which she is willing to do.  Discussed return precautions.  Ulcer of skin ([place]) Not evaluated today.  Patient has follow-up tomorrow with the wound care center.  Encounter for chronic pain management Stable chronic pain.  [place] controlled substance database reviewed and findings are appropriate.  Provided 1 month of refills, as I want to see her back sooner given her current mood struggles.  Follow-up in 1 month.  FOLLOW UP: Follow up in 1 mo for above issues Schedule with Loistine Simas J. [name], [facility]

## 2018-01-25 NOTE — Assessment & Plan Note (Signed)
>>  ASSESSMENT AND PLAN FOR INSULIN DEPENDENT TYPE 2 DIABETES MELLITUS ([place]) WRITTEN ON 01/25/2018  4:15 PM BY Rolen Conger J, MD  Postprandial blood sugar is elevated.  We will add NovoLog 15 units with lunch.  Continue 25 units with dinner, as well as current dose of [place].  Advised patient to schedule an appointment with Dr. Valentina Lucks to f/u on diabetes. Will need A1c at next visit. Our point of care A1c machine is down today.

## 2018-01-25 NOTE — Assessment & Plan Note (Signed)
Stable chronic pain.  [place] controlled substance database reviewed and findings are appropriate.  Provided 1 month of refills, as I want to see her back sooner given her current mood struggles.  Follow-up in 1 month.

## 2018-01-25 NOTE — Assessment & Plan Note (Signed)
Postprandial blood sugar is elevated.  We will add NovoLog 15 units with lunch.  Continue 25 units with dinner, as well as current dose of [place].  Advised patient to schedule an appointment with Dr. Valentina Lucks to f/u on diabetes. Will need A1c at next visit. Our point of care A1c machine is down today.

## 2018-01-26 ENCOUNTER — Emergency Department (HOSPITAL_COMMUNITY)
Admission: EM | Admit: 2018-01-26 | Discharge: 2018-01-26 | Disposition: A | Discharge status: Home / Self Care | Payer: Medicaid Other | Source: Home / Self Care

## 2018-01-26 ENCOUNTER — Encounter: Payer: Medicaid Other | Admitting: Nurse Practitioner

## 2018-01-26 ENCOUNTER — Encounter (HOSPITAL_COMMUNITY): Payer: Self-pay

## 2018-01-26 ENCOUNTER — Emergency Department (HOSPITAL_COMMUNITY): Payer: Medicaid Other

## 2018-01-26 DIAGNOSIS — Z5321 Procedure and treatment not carried out due to patient leaving prior to being seen by health care provider: Secondary | ICD-10-CM

## 2018-01-26 DIAGNOSIS — M79672 Pain in left foot: Secondary | ICD-10-CM

## 2018-01-26 DIAGNOSIS — E11621 Type 2 diabetes mellitus with foot ulcer: Secondary | ICD-10-CM | POA: Diagnosis not present

## 2018-01-26 LAB — CBC WITH DIFFERENTIAL/PLATELET
Basophils Absolute: 0 10*3/uL (ref 0.0–0.1)
Basophils Relative: 0 %
Eosinophils Absolute: 0.1 10*3/uL (ref 0.0–0.7)
Eosinophils Relative: 1 %
HCT: 35.7 % — ABNORMAL LOW (ref 36.0–46.0)
HEMOGLOBIN: 12.2 g/dL (ref 12.0–15.0)
LYMPHS ABS: 3.2 10*3/uL (ref 0.7–4.0)
LYMPHS PCT: 29 %
MCH: 30.5 pg (ref 26.0–34.0)
MCHC: 34.2 g/dL (ref 30.0–36.0)
MCV: 89.3 fL (ref 78.0–100.0)
Monocytes Absolute: 0.8 10*3/uL (ref 0.1–1.0)
Monocytes Relative: 7 %
NEUTROS PCT: 63 %
Neutro Abs: 7.1 10*3/uL (ref 1.7–7.7)
Platelets: 320 10*3/uL (ref 150–400)
RBC: 4 MIL/uL (ref 3.87–5.11)
RDW: 12.8 % (ref 11.5–15.5)
WBC: 11.2 10*3/uL — AB (ref 4.0–10.5)

## 2018-01-26 LAB — BASIC METABOLIC PANEL
Anion gap: 11 (ref 5–15)
BUN: 10 mg/dL (ref 6–20)
CHLORIDE: 101 mmol/L (ref 101–111)
CO2: 25 mmol/L (ref 22–32)
Calcium: 8.9 mg/dL (ref 8.9–10.3)
Creatinine, Ser: 0.55 mg/dL (ref 0.44–1.00)
GFR calc Af Amer: >60 mL/min (ref >60)
GFR calc non Af Amer: >60 mL/min (ref >60)
GLUCOSE: 372 mg/dL — AB (ref 65–99)
POTASSIUM: 4 mmol/L (ref 3.5–5.1)
Sodium: 137 mmol/L (ref 135–145)

## 2018-01-26 NOTE — ED Triage Notes (Signed)
Pt presents with 4 week h/o ulcer to L foot.  Pt is being treated at wound center to ulcer to L big toe and noted area to L outer foot.  Pt started abx on 2/11 with area becoming more painful, red and swollen.

## 2018-01-26 NOTE — ED Notes (Signed)
No reply when called for VS at 19:54.

## 2018-01-26 NOTE — ED Notes (Signed)
No reply when called for VS at 20:52.

## 2018-01-27 ENCOUNTER — Other Ambulatory Visit: Payer: Self-pay

## 2018-01-27 ENCOUNTER — Inpatient Hospital Stay (HOSPITAL_COMMUNITY): Payer: Medicaid Other

## 2018-01-27 ENCOUNTER — Inpatient Hospital Stay (HOSPITAL_COMMUNITY)
Admission: EM | Admit: 2018-01-27 | Discharge: 2018-02-03 | DRG: 617 | Disposition: A | Discharge status: Home Health | Payer: Medicaid Other | Attending: Family Medicine | Admitting: Family Medicine

## 2018-01-27 DIAGNOSIS — D72829 Elevated white blood cell count, unspecified: Secondary | ICD-10-CM | POA: Diagnosis not present

## 2018-01-27 DIAGNOSIS — Z79899 Other long term (current) drug therapy: Secondary | ICD-10-CM | POA: Diagnosis not present

## 2018-01-27 DIAGNOSIS — E11621 Type 2 diabetes mellitus with foot ulcer: Secondary | ICD-10-CM | POA: Insufficient documentation

## 2018-01-27 DIAGNOSIS — N141 Nephropathy induced by other drugs, medicaments and biological substances: Secondary | ICD-10-CM | POA: Diagnosis not present

## 2018-01-27 DIAGNOSIS — E0810 Diabetes mellitus due to underlying condition with ketoacidosis without coma: Secondary | ICD-10-CM | POA: Diagnosis not present

## 2018-01-27 DIAGNOSIS — E1165 Type 2 diabetes mellitus with hyperglycemia: Secondary | ICD-10-CM | POA: Diagnosis present

## 2018-01-27 DIAGNOSIS — Z87891 Personal history of nicotine dependence: Secondary | ICD-10-CM | POA: Diagnosis not present

## 2018-01-27 DIAGNOSIS — E1149 Type 2 diabetes mellitus with other diabetic neurological complication: Secondary | ICD-10-CM

## 2018-01-27 DIAGNOSIS — B9689 Other specified bacterial agents as the cause of diseases classified elsewhere: Secondary | ICD-10-CM | POA: Diagnosis not present

## 2018-01-27 DIAGNOSIS — L97509 Non-pressure chronic ulcer of other part of unspecified foot with unspecified severity: Secondary | ICD-10-CM | POA: Insufficient documentation

## 2018-01-27 DIAGNOSIS — E1169 Type 2 diabetes mellitus with other specified complication: Secondary | ICD-10-CM | POA: Diagnosis not present

## 2018-01-27 DIAGNOSIS — E1142 Type 2 diabetes mellitus with diabetic polyneuropathy: Secondary | ICD-10-CM

## 2018-01-27 DIAGNOSIS — B951 Streptococcus, group B, as the cause of diseases classified elsewhere: Secondary | ICD-10-CM | POA: Diagnosis not present

## 2018-01-27 DIAGNOSIS — E871 Hypo-osmolality and hyponatremia: Secondary | ICD-10-CM | POA: Diagnosis not present

## 2018-01-27 DIAGNOSIS — D638 Anemia in other chronic diseases classified elsewhere: Secondary | ICD-10-CM | POA: Diagnosis present

## 2018-01-27 DIAGNOSIS — J449 Chronic obstructive pulmonary disease, unspecified: Secondary | ICD-10-CM | POA: Diagnosis not present

## 2018-01-27 DIAGNOSIS — L97529 Non-pressure chronic ulcer of other part of left foot with unspecified severity: Secondary | ICD-10-CM | POA: Diagnosis present

## 2018-01-27 DIAGNOSIS — D649 Anemia, unspecified: Secondary | ICD-10-CM | POA: Diagnosis present

## 2018-01-27 DIAGNOSIS — Z1629 Resistance to other single specified antibiotic: Secondary | ICD-10-CM | POA: Diagnosis not present

## 2018-01-27 DIAGNOSIS — M86172 Other acute osteomyelitis, left ankle and foot: Secondary | ICD-10-CM | POA: Diagnosis not present

## 2018-01-27 DIAGNOSIS — Z833 Family history of diabetes mellitus: Secondary | ICD-10-CM | POA: Diagnosis not present

## 2018-01-27 DIAGNOSIS — W06XXXA Fall from bed, initial encounter: Secondary | ICD-10-CM | POA: Diagnosis not present

## 2018-01-27 DIAGNOSIS — N179 Acute kidney failure, unspecified: Secondary | ICD-10-CM | POA: Diagnosis not present

## 2018-01-27 DIAGNOSIS — E118 Type 2 diabetes mellitus with unspecified complications: Secondary | ICD-10-CM | POA: Diagnosis not present

## 2018-01-27 DIAGNOSIS — K219 Gastro-esophageal reflux disease without esophagitis: Secondary | ICD-10-CM | POA: Diagnosis present

## 2018-01-27 DIAGNOSIS — Z794 Long term (current) use of insulin: Secondary | ICD-10-CM

## 2018-01-27 DIAGNOSIS — Z885 Allergy status to narcotic agent status: Secondary | ICD-10-CM

## 2018-01-27 DIAGNOSIS — M86272 Subacute osteomyelitis, left ankle and foot: Secondary | ICD-10-CM

## 2018-01-27 DIAGNOSIS — L03116 Cellulitis of left lower limb: Secondary | ICD-10-CM | POA: Diagnosis not present

## 2018-01-27 DIAGNOSIS — D62 Acute posthemorrhagic anemia: Secondary | ICD-10-CM | POA: Insufficient documentation

## 2018-01-27 DIAGNOSIS — E785 Hyperlipidemia, unspecified: Secondary | ICD-10-CM | POA: Diagnosis present

## 2018-01-27 DIAGNOSIS — T8781 Dehiscence of amputation stump: Secondary | ICD-10-CM | POA: Diagnosis not present

## 2018-01-27 DIAGNOSIS — Z7951 Long term (current) use of inhaled steroids: Secondary | ICD-10-CM | POA: Diagnosis not present

## 2018-01-27 DIAGNOSIS — E1140 Type 2 diabetes mellitus with diabetic neuropathy, unspecified: Secondary | ICD-10-CM | POA: Diagnosis not present

## 2018-01-27 DIAGNOSIS — G8929 Other chronic pain: Secondary | ICD-10-CM | POA: Diagnosis present

## 2018-01-27 DIAGNOSIS — I959 Hypotension, unspecified: Secondary | ICD-10-CM | POA: Diagnosis not present

## 2018-01-27 DIAGNOSIS — G4733 Obstructive sleep apnea (adult) (pediatric): Secondary | ICD-10-CM | POA: Diagnosis present

## 2018-01-27 DIAGNOSIS — E119 Type 2 diabetes mellitus without complications: Secondary | ICD-10-CM

## 2018-01-27 DIAGNOSIS — E1159 Type 2 diabetes mellitus with other circulatory complications: Secondary | ICD-10-CM | POA: Diagnosis not present

## 2018-01-27 DIAGNOSIS — J45909 Unspecified asthma, uncomplicated: Secondary | ICD-10-CM | POA: Diagnosis not present

## 2018-01-27 DIAGNOSIS — Z6841 Body Mass Index (BMI) 40.0 and over, adult: Secondary | ICD-10-CM | POA: Diagnosis not present

## 2018-01-27 DIAGNOSIS — Z91018 Allergy to other foods: Secondary | ICD-10-CM

## 2018-01-27 DIAGNOSIS — IMO0002 Reserved for concepts with insufficient information to code with codable children: Secondary | ICD-10-CM

## 2018-01-27 DIAGNOSIS — Z89422 Acquired absence of other left toe(s): Secondary | ICD-10-CM | POA: Diagnosis not present

## 2018-01-27 DIAGNOSIS — T368X5A Adverse effect of other systemic antibiotics, initial encounter: Secondary | ICD-10-CM | POA: Diagnosis not present

## 2018-01-27 DIAGNOSIS — E669 Obesity, unspecified: Secondary | ICD-10-CM | POA: Diagnosis not present

## 2018-01-27 DIAGNOSIS — I10 Essential (primary) hypertension: Secondary | ICD-10-CM

## 2018-01-27 DIAGNOSIS — J9620 Acute and chronic respiratory failure, unspecified whether with hypoxia or hypercapnia: Secondary | ICD-10-CM | POA: Diagnosis not present

## 2018-01-27 DIAGNOSIS — L03032 Cellulitis of left toe: Secondary | ICD-10-CM | POA: Diagnosis not present

## 2018-01-27 DIAGNOSIS — E1151 Type 2 diabetes mellitus with diabetic peripheral angiopathy without gangrene: Secondary | ICD-10-CM | POA: Diagnosis present

## 2018-01-27 HISTORY — DX: Other acute osteomyelitis, left ankle and foot: M86.172

## 2018-01-27 LAB — BASIC METABOLIC PANEL
ANION GAP: 13 (ref 5–15)
BUN: 10 mg/dL (ref 6–20)
CALCIUM: 9.1 mg/dL (ref 8.9–10.3)
CO2: 22 mmol/L (ref 22–32)
Chloride: 100 mmol/L — ABNORMAL LOW (ref 101–111)
Creatinine, Ser: 0.48 mg/dL (ref 0.44–1.00)
Glucose, Bld: 393 mg/dL — ABNORMAL HIGH (ref 65–99)
POTASSIUM: 4 mmol/L (ref 3.5–5.1)
Sodium: 135 mmol/L (ref 135–145)

## 2018-01-27 LAB — GLUCOSE, CAPILLARY
GLUCOSE-CAPILLARY: 299 mg/dL — AB (ref 65–99)
Glucose-Capillary: 301 mg/dL — ABNORMAL HIGH (ref 65–99)

## 2018-01-27 LAB — CBC
HEMATOCRIT: 35.6 % — AB (ref 36.0–46.0)
Hemoglobin: 12.1 g/dL (ref 12.0–15.0)
MCH: 30.3 pg (ref 26.0–34.0)
MCHC: 34 g/dL (ref 30.0–36.0)
MCV: 89 fL (ref 78.0–100.0)
Platelets: 306 10*3/uL (ref 150–400)
RBC: 4 MIL/uL (ref 3.87–5.11)
RDW: 12.8 % (ref 11.5–15.5)
WBC: 11.7 10*3/uL — AB (ref 4.0–10.5)

## 2018-01-27 LAB — HEMOGLOBIN A1C
HEMOGLOBIN A1C: 13.8 % — AB (ref 4.8–5.6)
MEAN PLASMA GLUCOSE: 349.36 mg/dL

## 2018-01-27 LAB — CBG MONITORING, ED
GLUCOSE-CAPILLARY: 393 mg/dL — AB (ref 65–99)
GLUCOSE-CAPILLARY: 405 mg/dL — AB (ref 65–99)
Glucose-Capillary: 376 mg/dL — ABNORMAL HIGH (ref 65–99)
Glucose-Capillary: 372 mg/dL — ABNORMAL HIGH (ref 65–99)

## 2018-01-27 MED ORDER — HYDROCODONE-ACETAMINOPHEN 10-325 MG PO TABS
1.0000 | ORAL_TABLET | Freq: Three times a day (TID) | ORAL | Status: DC | PRN
  Administered 2018-01-27 – 2018-02-02 (×12): 1 via ORAL
  Filled 2018-01-27 (×14): qty 1

## 2018-01-27 MED ORDER — LISINOPRIL 5 MG PO TABS
5.0000 mg | ORAL_TABLET | Freq: Every day | ORAL | Status: DC
  Administered 2018-01-27 – 2018-01-28 (×2): 5 mg via ORAL
  Filled 2018-01-27 (×3): qty 1

## 2018-01-27 MED ORDER — INSULIN ASPART 100 UNIT/ML ~~LOC~~ SOLN
0.0000 [IU] | Freq: Three times a day (TID) | SUBCUTANEOUS | Status: DC
  Administered 2018-01-27: 15 [IU] via SUBCUTANEOUS
  Administered 2018-01-27: 8 [IU] via SUBCUTANEOUS
  Administered 2018-01-27: 15 [IU] via SUBCUTANEOUS
  Administered 2018-01-28: 11 [IU] via SUBCUTANEOUS
  Administered 2018-01-28: 15 [IU] via SUBCUTANEOUS
  Administered 2018-01-28: 11 [IU] via SUBCUTANEOUS
  Filled 2018-01-27 (×2): qty 1

## 2018-01-27 MED ORDER — ATORVASTATIN CALCIUM 20 MG PO TABS
20.0000 mg | ORAL_TABLET | Freq: Every day | ORAL | Status: DC
  Administered 2018-01-27 – 2018-02-02 (×7): 20 mg via ORAL
  Filled 2018-01-27 (×7): qty 1

## 2018-01-27 MED ORDER — ALBUTEROL SULFATE HFA 108 (90 BASE) MCG/ACT IN AERS: 1.0000 | INHALATION_SPRAY | Freq: Four times a day (QID) | RESPIRATORY_TRACT | Status: DC | PRN

## 2018-01-27 MED ORDER — INSULIN GLARGINE 100 UNIT/ML ~~LOC~~ SOLN
52.0000 [IU] | Freq: Two times a day (BID) | SUBCUTANEOUS | Status: DC
  Administered 2018-01-28 – 2018-01-29 (×3): 52 [IU] via SUBCUTANEOUS
  Filled 2018-01-27 (×6): qty 0.52

## 2018-01-27 MED ORDER — ONDANSETRON HCL 4 MG/2ML IJ SOLN
4.0000 mg | Freq: Four times a day (QID) | INTRAMUSCULAR | Status: DC | PRN
  Administered 2018-01-28 – 2018-01-29 (×3): 4 mg via INTRAVENOUS
  Filled 2018-01-27 (×3): qty 2

## 2018-01-27 MED ORDER — VANCOMYCIN HCL 10 G IV SOLR
1500.0000 mg | Freq: Once | INTRAVENOUS | Status: DC
  Filled 2018-01-27: qty 1500

## 2018-01-27 MED ORDER — MORPHINE SULFATE (PF) 4 MG/ML IV SOLN
6.0000 mg | Freq: Once | INTRAVENOUS | Status: AC
  Administered 2018-01-27: 6 mg via INTRAVENOUS
  Filled 2018-01-27: qty 2

## 2018-01-27 MED ORDER — ALBUTEROL SULFATE (2.5 MG/3ML) 0.083% IN NEBU
2.5000 mg | INHALATION_SOLUTION | Freq: Four times a day (QID) | RESPIRATORY_TRACT | Status: DC | PRN
  Administered 2018-01-30 – 2018-02-02 (×2): 2.5 mg via RESPIRATORY_TRACT
  Filled 2018-01-27 (×3): qty 3

## 2018-01-27 MED ORDER — ONDANSETRON HCL 4 MG PO TABS: 4.0000 mg | ORAL_TABLET | Freq: Four times a day (QID) | ORAL | Status: DC | PRN

## 2018-01-27 MED ORDER — GABAPENTIN 600 MG PO TABS
1200.0000 mg | ORAL_TABLET | Freq: Three times a day (TID) | ORAL | Status: DC
  Administered 2018-01-27 – 2018-02-03 (×20): 1200 mg via ORAL
  Filled 2018-01-27 (×21): qty 2

## 2018-01-27 MED ORDER — MORPHINE SULFATE (PF) 4 MG/ML IV SOLN
2.0000 mg | Freq: Four times a day (QID) | INTRAVENOUS | Status: DC | PRN
  Administered 2018-01-27: 2 mg via INTRAVENOUS
  Filled 2018-01-27: qty 1

## 2018-01-27 MED ORDER — PANTOPRAZOLE SODIUM 40 MG PO TBEC: 40.0000 mg | DELAYED_RELEASE_TABLET | Freq: Every day | ORAL | Status: DC

## 2018-01-27 MED ORDER — MOMETASONE FURO-FORMOTEROL FUM 200-5 MCG/ACT IN AERO
2.0000 | INHALATION_SPRAY | Freq: Two times a day (BID) | RESPIRATORY_TRACT | Status: DC
  Administered 2018-01-30 – 2018-01-31 (×3): 2 via RESPIRATORY_TRACT
  Filled 2018-01-27: qty 8.8

## 2018-01-27 MED ORDER — MORPHINE SULFATE (PF) 4 MG/ML IV SOLN
4.0000 mg | INTRAVENOUS | Status: DC | PRN
Start: 2018-01-27 — End: 2018-01-29
  Administered 2018-01-27 – 2018-01-28 (×4): 4 mg via INTRAVENOUS
  Filled 2018-01-27 (×4): qty 1

## 2018-01-27 MED ORDER — PIPERACILLIN-TAZOBACTAM 3.375 G IVPB 30 MIN
3.3750 g | Freq: Once | INTRAVENOUS | Status: AC
  Administered 2018-01-27: 3.375 g via INTRAVENOUS
  Filled 2018-01-27: qty 50

## 2018-01-27 MED ORDER — CARVEDILOL 3.125 MG PO TABS
3.1250 mg | ORAL_TABLET | Freq: Two times a day (BID) | ORAL | Status: DC
  Administered 2018-01-27 – 2018-01-28 (×2): 3.125 mg via ORAL
  Filled 2018-01-27 (×4): qty 1

## 2018-01-27 MED ORDER — INSULIN DEGLUDEC 200 UNIT/ML ~~LOC~~ SOPN: 105.0000 [IU] | PEN_INJECTOR | Freq: Every day | SUBCUTANEOUS | Status: DC

## 2018-01-27 MED ORDER — LISINOPRIL 5 MG PO TABS: 5.0000 mg | ORAL_TABLET | Freq: Every day | ORAL | Status: DC

## 2018-01-27 MED ORDER — PANTOPRAZOLE SODIUM 40 MG PO TBEC
40.0000 mg | DELAYED_RELEASE_TABLET | Freq: Every day | ORAL | Status: DC
  Administered 2018-01-27 – 2018-02-03 (×7): 40 mg via ORAL
  Filled 2018-01-27 (×8): qty 1

## 2018-01-27 MED ORDER — POLYETHYLENE GLYCOL 3350 17 G PO PACK: 17.0000 g | PACK | Freq: Every day | ORAL | Status: DC | PRN

## 2018-01-27 MED ORDER — ENOXAPARIN SODIUM 40 MG/0.4ML ~~LOC~~ SOLN
40.0000 mg | SUBCUTANEOUS | Status: DC
  Administered 2018-01-28 – 2018-01-30 (×3): 40 mg via SUBCUTANEOUS
  Filled 2018-01-27 (×3): qty 0.4

## 2018-01-27 MED ORDER — VANCOMYCIN HCL 10 G IV SOLR
2500.0000 mg | Freq: Once | INTRAVENOUS | Status: AC
  Administered 2018-01-27: 2500 mg via INTRAVENOUS
  Filled 2018-01-27: qty 2500

## 2018-01-27 MED ORDER — VANCOMYCIN HCL IN DEXTROSE 1-5 GM/200ML-% IV SOLN
1000.0000 mg | Freq: Three times a day (TID) | INTRAVENOUS | Status: DC
  Administered 2018-01-27 – 2018-01-29 (×6): 1000 mg via INTRAVENOUS
  Filled 2018-01-27 (×8): qty 200

## 2018-01-27 MED ORDER — INSULIN GLARGINE 100 UNIT/ML ~~LOC~~ SOLN
105.0000 [IU] | Freq: Every day | SUBCUTANEOUS | Status: DC
  Administered 2018-01-27: 105 [IU] via SUBCUTANEOUS
  Filled 2018-01-27: qty 1.05

## 2018-01-27 MED ORDER — PIPERACILLIN-TAZOBACTAM 3.375 G IVPB
3.3750 g | Freq: Three times a day (TID) | INTRAVENOUS | Status: DC
  Administered 2018-01-27 – 2018-01-31 (×12): 3.375 g via INTRAVENOUS
  Filled 2018-01-27 (×14): qty 50

## 2018-01-27 NOTE — ED Notes (Signed)
Patient transported to MRI 

## 2018-01-27 NOTE — Progress Notes (Signed)
Pharmacy Antibiotic Note  [name] is a 42 y.o. female admitted on 01/27/2018 with cellulitis on L foot. She has been on oral antibiotics as an outpatient but has had worsening s/sx. Starting broad spectrum antibiotics.    Plan: -Vancomycin 2500 mg IV x1 then 1 g IV q8h -Monitor renal fx, cultures, VT at steady state    Height: 5\' 2"  (157.5 cm) Weight: (!) 319 lb (144.7 kg) IBW/kg (Calculated) : 50.1  Temp (24hrs), Avg:98 F (36.7 C), Min:97.8 F (36.6 C), Max:98.1 F (36.7 C)  Recent Labs  Lab 01/26/18 1724  WBC 11.2*  CREATININE 0.55    Estimated Creatinine Clearance: 131 mL/min (by C-G formula based on SCr of 0.55 mg/dL).    Allergies  Allergen Reactions  . Kiwi Extract Shortness Of Breath and Swelling  . Nubain [Nalbuphine Hcl] Other (See Comments)    "FEELS LIKE SOMETHING CRAWLING ON ME"     Antimicrobials this admission: 2/22 vancomycin > 2/22 zosyn x1  Dose adjustments this admission: N/A  Microbiology results: 2/22 blood cx:   Harvel Quale 01/27/2018 4:35 AM

## 2018-01-27 NOTE — ED Triage Notes (Signed)
Pt arrived EMS from home with c/o L foot pain. Pt was seen earlier today for same but left before being seen by provider BP 176/102 P110 [id number]

## 2018-01-27 NOTE — ED Notes (Signed)
CBG: 405. RN notified. Pt instructed multiple times to not eat lunch until sugar was controlled but pt refused and is eating.

## 2018-01-27 NOTE — Progress Notes (Signed)
Pharmacy Antibiotic Note  [name] is a 43 y.o. female started on vancomycin earlier today for left foot cellulitis now to continue zosyn given diabetic history. Renal function wnl.  Plan: 1) Zosyn 3.375g IV q8 (4 hour infusion)  Height: 5\' 2"  (157.5 cm) Weight: (!) 319 lb (144.7 kg) IBW/kg (Calculated) : 50.1  Temp (24hrs), Avg:98 F (36.7 C), Min:97.8 F (36.6 C), Max:98.1 F (36.7 C)  Recent Labs  Lab 01/26/18 1724 01/27/18 0429  WBC 11.2* 11.7*  CREATININE 0.55 0.48    Estimated Creatinine Clearance: 131 mL/min (by C-G formula based on SCr of 0.48 mg/dL).    Allergies  Allergen Reactions  . Kiwi Extract Shortness Of Breath and Swelling  . Nubain [Nalbuphine Hcl] Other (See Comments)    "FEELS LIKE SOMETHING CRAWLING ON ME"    Antimicrobials this admission: 2/22 Vancomcyin >> 2/22 Zosyn >>  Dose adjustments this admission: N/a  Microbiology results: 2/22 blood cx>>  Thank you for allowing pharmacy to be a part of this patient's care.  Deboraha Sprang 01/27/2018 8:35 AM

## 2018-01-27 NOTE — ED Notes (Signed)
Attempted report 

## 2018-01-27 NOTE — Consult Note (Signed)
ORTHOPAEDIC CONSULTATION  REQUESTING PHYSICIAN: Lind Covert, MD  Chief Complaint: Ulceration osteomyelitis pain left foot fifth metatarsal head.  HPI: [name] is a 42 y.o. female who presents with cellulitis ulceration and pain left foot fifth metatarsal head.  Patient has uncontrolled type 2 diabetes.  She states she is been going to the wound center in [place] and has been undergoing serial debridements for the past month.  Past Medical History:  Diagnosis Date  . Alveolar hypoventilation   . Anemia    not on iron pill  . Arthritis   . Asthma   . Bipolar 2 disorder ([place])   . Carpal tunnel syndrome on right    recurrent  . Cellulitis 08/2010-08/2011  . Chronic pain   . COPD (chronic obstructive pulmonary disease) ([name])    Symbicort daily and Proventil as needed  . Costochondritis   . Depression   . Diabetes mellitus 2000   Type 2, Uncontrolled.Takes Lantus daily.Fasting blood sugar runs 150  . Dizziness    occasionally  . Drug-seeking behavior   . GERD (gastroesophageal reflux disease)    takes Pantoprazole and Zantac daily  . Headache    migraine-last one about a yr ago.Topamax daily  . HLD (hyperlipidemia)    takes Atorvastatin daily  . Hypertension    takes Lisinopril and Coreg daily  . Morbid obesity ([place])   . Muscle spasm    takes Flexeril as needed  . Nocturia   . Obstructive sleep apnea   . Peripheral neuropathy    takes Gabapentin daily  . Pneumonia    "walking" several yrs ago and as a baby  . Rectal fissure   . Restless leg   . Urinary frequency   . Varicose veins    Right medial thigh and Left leg    Past Surgical History:  Procedure Laterality Date  . CARPAL TUNNEL RELEASE    . CESAREAN SECTION    . KNEE ARTHROSCOPY Right 07/17/2010  . LEFT HEART CATHETERIZATION WITH CORONARY ANGIOGRAM N/A 07/27/2012   Procedure: LEFT HEART CATHETERIZATION WITH CORONARY ANGIOGRAM;  Surgeon: Sherren Mocha, MD;  Location: [facility] CATH LAB;  Service:  Cardiovascular;  Laterality: N/A;  . left knee surgery     screws she thinks  . MASS EXCISION N/A 06/29/2013   Procedure:  WIDE LOCAL EXCISION OF POSTERIOR NECK ABSCESS;  Surgeon: Ralene Ok, MD;  Location: [facility];  Service: General;  Laterality: N/A;   Social History   Socioeconomic History  . Marital status: Married    Spouse name: Not on file  . Number of children: Not on file  . Years of education: Not on file  . Highest education level: Not on file  Social Needs  . Financial resource strain: Not on file  . Food insecurity - worry: Not on file  . Food insecurity - inability: Not on file  . Transportation needs - medical: Not on file  . Transportation needs - non-medical: Not on file  Occupational History  . Not on file  Tobacco Use  . Smoking status: Former Smoker    Packs/day: 0.30    Years: 0.30    Pack years: 0.09    Types: Cigarettes    Last attempt to quit: 12/06/1993    Years since quitting: 24.1  . Smokeless tobacco: Never Used  Substance and Sexual Activity  . Alcohol use: No    Alcohol/week: 0.0 oz  . Drug use: Yes    Comment: OD attempts on home meds    .  Sexual activity: Yes    Partners: Male  Other Topics Concern  . Not on file  Social History Narrative   Lives in [place] with her fiance and 40 yr old dtr.   Family History  Problem Relation Age of Onset  . Diabetes Mother   . Hyperlipidemia Mother   . Depression Mother   . Varicose Veins Mother   . Heart attack Paternal Uncle   . Heart disease Paternal Grandmother   . Heart attack Paternal Grandmother   . Heart attack Paternal Grandfather   . Heart disease Paternal Grandfather   . Heart attack Father   . Cancer Maternal Grandmother        COLON  . Hypertension Maternal Grandmother   . Hyperlipidemia Maternal Grandmother   . Diabetes Maternal Grandmother   . Other Maternal Grandfather        GUN SHOT   - negative except otherwise stated in the family history section Allergies  Allergen  Reactions  . Kiwi Extract Shortness Of Breath and Swelling  . Nubain [Nalbuphine Hcl] Other (See Comments)    "FEELS LIKE SOMETHING CRAWLING ON ME"   Prior to Admission medications   Medication Sig Start Date End Date Taking? Authorizing Provider  albuterol (PROVENTIL HFA;VENTOLIN HFA) 108 (90 Base) MCG/ACT inhaler Inhale 1-2 puffs into the lungs every 6 (six) hours as needed for wheezing or shortness of breath. 01/17/18  Yes [name], MD  atorvastatin (LIPITOR) 20 MG tablet Take 1 tablet (20 mg total) by mouth daily. 03/23/17  Yes [name], MD  carvedilol (COREG) 3.125 MG tablet TAKE ONE (1) TABLET BY MOUTH TWO (2) TIMES DAILY 12/06/17  Yes [name], MD  Cholecalciferol 1000 units tablet Take 1 tablet (1,000 Units total) by mouth daily. 04/05/17  Yes [name], MD  cyclobenzaprine (FLEXERIL) 5 MG tablet TAKE 1 TABLET EVERY DAY AS NEEDED FOR MUSCLE SPASMS 11/22/17  Yes [name], MD  gabapentin (NEURONTIN) 600 MG tablet Take 2 tablets (1,200 mg total) by mouth 3 (three) times daily. 01/25/18  Yes [name], MD  HYDROcodone-acetaminophen (NORCO) 10-325 MG tablet Take 1 tablet by mouth every 8 (eight) hours as needed for moderate pain. 01/25/18  Yes [name], MD  insulin aspart (NOVOLOG) 100 UNIT/ML injection Inject 25 Units into the skin daily with supper.   Yes [provider]  Insulin Degludec (TRESIBA FLEXTOUCH) 200 UNIT/ML SOPN Inject 106 Units into the skin daily with breakfast. 01/17/18  Yes [name], MD  liraglutide (VICTOZA) 18 MG/3ML SOPN INJECT 0.3 MILLILITERS (1.8MG TOTAL) INTO THE SKIN DAILY 01/17/18  Yes [name], MD  lisinopril (PRINIVIL,ZESTRIL) 5 MG tablet Take 1 tablet (5 mg total) by mouth daily. 11/02/17  Yes [name], MD  naloxone [facility]) nasal spray 4 mg/0.1 mL 1 spray into the nose as needed for overdose Patient taking differently: Place 1 spray into the nose  once as needed (overdose). 1 spray into the nose as needed for overdose 03/23/17  Yes [name], MD  pantoprazole (PROTONIX) 40 MG tablet Take 1 tablet (40 mg total) by mouth daily. 09/01/17  Yes Lind Covert, MD  SYMBICORT 160-4.5 MCG/ACT inhaler INHALE 2 PUFFS INTO THE LUNGS 2 TIMES DAILY 08/23/17  Yes [name], MD  ACCU-CHEK GUIDE test strip USE T0 CHECK BLOOD SUGAR 3 TIMES DAILY 07/26/17   [name], MD  ACCU-CHEK SOFTCLIX LANCETS lancets Use as instructed to check blood sugar 3 times  per day 04/07/17   [name], MD  Blood Glucose Monitoring Suppl (ACCU-CHEK NANO SMARTVIEW) w/Device KIT Use to check blood sugar 3 times daily 04/07/17   [name], MD  Insulin Pen Needle (NOVOFINE) 32G X 6 MM MISC 1 Units by Does not apply route 3 (three) times daily. 07/28/17   [name], MD  Insulin Pen Needle (PEN NEEDLES) 31G X 6 MM MISC Use to inject insulin daily 03/23/17   [name], MD   Mr Foot Left Wo Contrast  Result Date: 01/27/2018 CLINICAL DATA:  Worsening ulcer along the lateral base of the fifth MTP joint. EXAM: MRI OF THE LEFT FOOT WITHOUT CONTRAST TECHNIQUE: Multiplanar, multisequence MR imaging of the left forefoot was performed. No intravenous contrast was administered. COMPARISON:  None. FINDINGS: Bones/Joint/Cartilage Soft tissue ulcer along the lateral aspect of the fifth metatarsal head. Mild cortical irregularity and bone marrow edema in the fifth metatarsal head. No other marrow signal abnormality. No fracture or dislocation. Normal alignment. No joint effusion. Mild osteoarthritis of third, fourth and fifth tarsometatarsal joints. Ligaments Collateral ligaments are intact.  Lisfranc joint is intact. Muscles and Tendons Flexor, peroneal and extensor compartment tendons are intact. Muscles are normal. Soft tissue No fluid collection or hematoma. No soft tissue mass. Soft tissue edema along the dorsal aspect of the foot and more  severe around the fifth MTP joint. IMPRESSION: Soft tissue ulcer along the lateral aspect of the fifth metatarsal head. Mild cortical irregularity and bone marrow edema in the fifth metatarsal head concerning for osteomyelitis. Electronically Signed   By: Kathreen Devoid   On: 01/27/2018 11:31   Dg Foot Complete Left  Result Date: 01/26/2018 CLINICAL DATA:  Ulcer and pain on the little digit. Evaluate for osteomyelitis. EXAM: LEFT FOOT - COMPLETE 3+ VIEW COMPARISON:  09/26/2017 FINDINGS: Soft tissue irregularity and bandage over the fifth metatarsal head. No evidence of erosion, soft tissue gas, or opaque foreign body. No fracture deformity. Heel ulcer and bandage also noted.  No adjacent acute bony finding. Extensive arterial calcification. Dystrophic soft tissue calcifications in the lower leg. IMPRESSION: No evidence of osteomyelitis. No opaque foreign body or soft tissue gas. Electronically Signed   By: Monte Fantasia M.D.   On: 01/26/2018 18:17   - pertinent xrays, CT, MRI studies were reviewed and independently interpreted  Positive ROS: All other systems have been reviewed and were otherwise negative with the exception of those mentioned in the HPI and as above.  Physical Exam: General: Alert, no acute distress Psychiatric: Patient is competent for consent with normal mood and affect Lymphatic: No axillary or cervical lymphadenopathy Cardiovascular: No pedal edema Respiratory: No cyanosis, no use of accessory musculature GI: No organomegaly, abdomen is soft and non-tender  Skin: Examination patient has painful cellulitis of the lateral aspect of the left foot.  There is an ulcer laterally to the fifth metatarsal head.  Images:  '@ENCIMAGES' @   Neurologic: Patient does not have protective sensation bilateral lower extremities.   MUSCULOSKELETAL:  Patient has a good dorsalis pedis and posterior tibial pulse she has swelling and cellulitis and ulceration over the fifth metatarsal head.   Plain radiographs do not show any destructive bony changes.  Review of the MRI scan shows osteomyelitis of the fifth metatarsal head.  Assessment: Assessment: Osteomyelitis left foot fifth metatarsal head with [place] grade 3 ulceration and uncontrolled type 2 diabetes.  Plan: Plan: Discussed with the patient the ideal option would be for amputation of the fifth  metatarsal head and little toe.  Patient was very tearful with the thought of any type of amputation.  Discussed that we could pursue IV antibiotics and oral antibiotics and then I could follow-up in the office.  Patient does not want to consider surgery at this time.  I will follow-up as an outpatient.  Thank you for the consult and the opportunity to see Ms. Joyce Copa, MD [facility] [phone] 5:32 PM

## 2018-01-27 NOTE — ED Notes (Signed)
Pt reports seeing the wound clinic yesterday. Pt reports she has been on cipro x2 weeks for wound on outer portion of foot. Pt reports clinic told her to come here because she might need IV antibiotics.

## 2018-01-27 NOTE — Progress Notes (Signed)
Inpatient Diabetes Program Recommendations  AACE/ADA: New Consensus Statement on Inpatient Glycemic Control (2015)  Target Ranges:  Prepandial:   less than 140 mg/dL      Peak postprandial:   less than 180 mg/dL (1-2 hours)      Critically ill patients:  140 - 180 mg/dL   Lab Results  Component Value Date   GLUCAP 393 (H) 01/27/2018   HGBA1C 13.8 (H) 01/27/2018   Spoke with patient about A1c level, 13.8% this admission and glucose control. Patient reports she is followed by Dr. [name] for DM management. NOted patient has had A1c of 13%  For 2 years. Asked patient about seeing an Endocrinologist. Patient reports that she saw one at one time and was on over 700 units of insulin a day. Spoke with patient about seeing a different Endocrinologist. Patient says she will think about it. Spoke with patient about different medication therapies available for patients with Diabetes. Patient is able to get her medications and takes them every day. No other needs identified at this time.  Thanks,  Tama Headings RN, MSN, BC-ADM, [facility] Inpatient Diabetes Coordinator Team Pager [phone] (8a-5p)

## 2018-01-27 NOTE — Progress Notes (Signed)
Patient has MR foot concerning for osteomyelitis.  Call Dr. Sharol Given and informed him of this. He will see the patient for further management.  Greatly appreciate orthopedics recommendations.

## 2018-01-27 NOTE — Progress Notes (Signed)
Received report from ED nurse [place].

## 2018-01-27 NOTE — ED Notes (Signed)
CBG: 372. RN notified.

## 2018-01-27 NOTE — Progress Notes (Addendum)
Interim Progress Note:  Evaluated patient this evening. She is tearful at the thought of losing her toe. She states that surgery is a "last resort" for her. She also notes that she is having severe pain, not relieved by 2mg  of morphine. Her foot is so tender that she cannot even stand to have the blanket touching it. Will increase her morphine to 4mg  q4hrs prn. Continue IV abx.  Hyman Bible, MD PGY-3

## 2018-01-27 NOTE — H&P (Addendum)
[facility] Admission History and Physical Service Pager: [phone]  Patient name: [name] Medical record number: [id number] Date of birth: 12/19/1975 Age: 42 y.o. Gender: female  Primary Care Provider: [name], MD Consultants: none Code Status: Full  Chief Complaint: L foot ulcer   Assessment and Plan: [name] is a 42 y.o. female presenting with L foot ulcer. PMH is significant for uncontrolled T2DM, COPD, HLD, HTN, OSA, bipolar disorder.  L foot diabetic foot ulcer. Newly worsening ulcer on lateral base of 5th MTP with increasing erythema and pain over the last 1 week. L foot xray negative for osteomyelitis but ulcer has a central area of purulence. WBC 11.7 but afebrile and well appearing, no concern for sepsis. Had initially been on po doxycycline starting on 01/11/18 but outpatient wound culture per wound care notes grew Serratia and Group B strep so had been taking po ciprofloxacin starting on 01/16/18. - admit to med-surg, attending Dr. Erin Hall - Vanc/zosyn (2/22-) - BCx pending - MRI foot pending - continue home norco 3-367m q8prn - morphine [id number] for breakthrough  Type 2 DM with diabetic neuropathy. Uncontrolled. TrTyler Aas06U daily with 15U at lunchtime and 25U with supper of novolog with supper. Last a1c 13.7 on 10/29/17. - continue home tresiba - mSSI - monitor CBGs - check a1c - continue home gabapentin  COPD. Stable. On symbicort 2 puffs BID with prn albuterol - dulera while admitted - prn albuterol  OA. Chronic, stable. Chronic norco [id number] for pain relief with flexeril prn.  - continue home norco  HTN. Stable. At home on coreg 3.125m33mD, lisinopril 5mg 61m- continue home coreg and lisinopril  HLD - continue home atorvastatin  FEN/GI: heart healthy carb modified diet, protonix Prophylaxis: lovenox  Disposition: admit for IV ABX  History of Present Illness:  TashaKalimah Hall 41 y.71 female  presenting with L foot ulcer. She is an uncontrolled diabetic and has had chronic L foot heel ulcer that she follows with wound care. She states that she developed some dry skin/callus at the lateral base of 5th MTP about 3 weeks ago which they debrided and then at a subsequent follow up 2 weeks ago was started on ciprofloxacin for new ulcer. Over the last 1 week had increasing redness, pain and swelling and at follow up yesterday at wound care was told to come to the ED for further evaluation due to worsening. She denies fever/chills, n/v.   Review Of Systems: Per HPI with the following additions:   Review of Systems  Constitutional: Negative for chills and fever.  Respiratory: Negative for cough, shortness of breath and wheezing.   Cardiovascular: Negative for chest pain and palpitations.  Gastrointestinal: Negative for abdominal pain, constipation, diarrhea, nausea and vomiting.  Genitourinary: Negative for dysuria, frequency and urgency.  Musculoskeletal:       L foot pain, redness, swelling  Skin:       L  Foot ulcer  Neurological: Negative for headaches.    Patient Active Problem List   Diagnosis Date Noted  . Diabetic foot ulcer ([name]) [place]24/2018  . Ulcer of skin ([name]) [place]09/2018  . Decreased pedal pulses 06/10/2017  . Adjustment disorder with mixed anxiety and depressed mood 11/04/2016  . History of medication noncompliance 11/04/2016  . Unilateral primary osteoarthritis, right knee 10/22/2016  . Diabetic neuropathy ([name]) [place]09/2017  . Syncope 02/25/2016  . De Quervain's tenosynovitis, bilateral 11/01/2015  . Vitamin D deficiency 09/05/2015  . Recurrent candidiasis of  vagina 09/05/2015  . Varicose veins of leg with complications 05/31/9484  . Neuropathy, intercostal nerve 12/17/2014  . Rectal itching 11/17/2014  . Restless leg syndrome 10/17/2014  . Chronic sinusitis 07/18/2014  . Headache 07/15/2014  . Vision, loss, sudden 07/12/2014  . Urge incontinence 10/15/2013  .  Encounter for chronic pain management 06/30/2013  . Low back pain 01/31/2013  . HLD (hyperlipidemia) 11/19/2012  . Chest pain 06/27/2012  . Right carpal tunnel syndrome 09/01/2011  . Bilateral knee pain 09/01/2011  . Insulin dependent diabetes mellitus ([place]) 05/22/2008  . Morbid obesity ([place]) 05/22/2008  . 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 Depression 05/22/2008  . Obstructive sleep apnea 05/22/2008  . Essential hypertension 05/22/2008  . Asthma 05/22/2008  . GERD 05/22/2008    Past Medical History: Past Medical History:  Diagnosis Date  . Alveolar hypoventilation   . Anemia    not on iron pill  . Arthritis   . Asthma   . Bipolar 2 disorder ([place])   . Carpal tunnel syndrome on right    recurrent  . Cellulitis 08/2010-08/2011  . Chronic pain   . COPD (chronic obstructive pulmonary disease) ([name])    Symbicort daily and Proventil as needed  . Costochondritis   . Depression   . Diabetes mellitus 2000   Type 2, Uncontrolled.Takes Lantus daily.Fasting blood sugar runs 150  . Dizziness    occasionally  . Drug-seeking behavior   . GERD (gastroesophageal reflux disease)    takes Pantoprazole and Zantac daily  . Headache    migraine-last one about a yr ago.Topamax daily  . HLD (hyperlipidemia)    takes Atorvastatin daily  . Hypertension    takes Lisinopril and Coreg daily  . Morbid obesity ([place])   . Muscle spasm    takes Flexeril as needed  . Nocturia   . Obstructive sleep apnea   . Peripheral neuropathy    takes Gabapentin daily  . Pneumonia    "walking" several yrs ago and as a baby  . Rectal fissure   . Restless leg   . Urinary frequency   . Varicose veins    Right medial thigh and Left leg     Past Surgical History: Past Surgical History:  Procedure Laterality Date  . CARPAL TUNNEL RELEASE    . CESAREAN SECTION    . KNEE ARTHROSCOPY Right 07/17/2010  . LEFT HEART CATHETERIZATION WITH CORONARY ANGIOGRAM N/A 07/27/2012   Procedure: LEFT HEART  CATHETERIZATION WITH CORONARY ANGIOGRAM;  Surgeon: Sherren Mocha, MD;  Location: [facility] CATH LAB;  Service: Cardiovascular;  Laterality: N/A;  . left knee surgery     screws she thinks  . MASS EXCISION N/A 06/29/2013   Procedure:  WIDE LOCAL EXCISION OF POSTERIOR NECK ABSCESS;  Surgeon: Ralene Ok, MD;  Location: [place];  Service: General;  Laterality: N/A;    Social History: Social History   Tobacco Use  . Smoking status: Former Smoker    Packs/day: 0.30    Years: 0.30    Pack years: 0.09    Types: Cigarettes    Last attempt to quit: 12/06/1993    Years since quitting: 24.1  . Smokeless tobacco: Never Used  Substance Use Topics  . Alcohol use: No    Alcohol/week: 0.0 oz  . Drug use: Yes    Comment: OD attempts on home meds     Additional social history: Lives at home with husband and daughter. Please also refer to relevant sections of EMR.  Family History: Family History  Problem Relation Age of Onset  . Diabetes Mother   . Hyperlipidemia Mother   . Depression Mother   . Varicose Veins Mother   . Heart attack Paternal Uncle   . Heart disease Paternal Grandmother   . Heart attack Paternal Grandmother   . Heart attack Paternal Grandfather   . Heart disease Paternal Grandfather   . Heart attack Father   . Cancer Maternal Grandmother        COLON  . Hypertension Maternal Grandmother   . Hyperlipidemia Maternal Grandmother   . Diabetes Maternal Grandmother   . Other Maternal Grandfather        GUN SHOT    Allergies and Medications: Allergies  Allergen Reactions  . Kiwi Extract Shortness Of Breath and Swelling  . Nubain [Nalbuphine Hcl] Other (See Comments)    "FEELS LIKE SOMETHING CRAWLING ON ME"   No current facility-administered medications on file prior to encounter.    Current Outpatient Medications on File Prior to Encounter  Medication Sig Dispense Refill  . albuterol (PROVENTIL HFA;VENTOLIN HFA) 108 (90 Base) MCG/ACT inhaler Inhale 1-2 puffs into the  lungs every 6 (six) hours as needed for wheezing or shortness of breath. 18 g 5  . atorvastatin (LIPITOR) 20 MG tablet Take 1 tablet (20 mg total) by mouth daily. 30 tablet 5  . carvedilol (COREG) 3.125 MG tablet TAKE ONE (1) TABLET BY MOUTH TWO (2) TIMES DAILY 60 tablet 3  . Cholecalciferol 1000 units tablet Take 1 tablet (1,000 Units total) by mouth daily. 90 tablet 0  . cyclobenzaprine (FLEXERIL) 5 MG tablet TAKE 1 TABLET EVERY DAY AS NEEDED FOR MUSCLE SPASMS 30 tablet 1  . gabapentin (NEURONTIN) 600 MG tablet Take 2 tablets (1,200 mg total) by mouth 3 (three) times daily. 180 tablet 5  . HYDROcodone-acetaminophen (NORCO) 10-325 MG tablet Take 1 tablet by mouth every 8 (eight) hours as needed for moderate pain. 90 tablet 0  . insulin aspart (NOVOLOG) 100 UNIT/ML injection Inject 25 Units into the skin daily with supper.    . Insulin Degludec (TRESIBA FLEXTOUCH) 200 UNIT/ML SOPN Inject 106 Units into the skin daily with breakfast. 18 mL 2  . liraglutide (VICTOZA) 18 MG/3ML SOPN INJECT 0.3 MILLILITERS (1.8MG TOTAL) INTO THE SKIN DAILY 9 mL 2  . lisinopril (PRINIVIL,ZESTRIL) 5 MG tablet Take 1 tablet (5 mg total) by mouth daily. 30 tablet 5  . naloxone (NARCAN) nasal spray 4 mg/0.1 mL 1 spray into the nose as needed for overdose (Patient taking differently: Place 1 spray into the nose once as needed (overdose). 1 spray into the nose as needed for overdose) 2 kit 0  . pantoprazole (PROTONIX) 40 MG tablet Take 1 tablet (40 mg total) by mouth daily. 30 tablet 1  . SYMBICORT 160-4.5 MCG/ACT inhaler INHALE 2 PUFFS INTO THE LUNGS 2 TIMES DAILY 10.2 g 4  . ACCU-CHEK GUIDE test strip USE T0 CHECK BLOOD SUGAR 3 TIMES DAILY 100 each 5  . ACCU-CHEK SOFTCLIX LANCETS lancets Use as instructed to check blood sugar 3 times per day 100 each 12  . Blood Glucose Monitoring Suppl (ACCU-CHEK NANO SMARTVIEW) w/Device KIT Use to check blood sugar 3 times daily 1 kit 0  . Insulin Pen Needle (NOVOFINE) 32G X 6 MM MISC 1  Units by Does not apply route 3 (three) times daily. 100 each 11  . Insulin Pen Needle (PEN NEEDLES) 31G X 6 MM MISC Use to inject insulin daily 100 each 3    Objective: BP Mar[place] Kitchen)  111/95   Pulse 85   Temp 97.8 F (36.6 C) (Oral)   Resp 18   Ht _0  (1.575 m)   Wt (!) 144.7 kg (319 lb)   LMP 01/06/2018 (Approximate)   SpO2 94%   BMI 58.35 kg/m  Exam: General: sitting up in bed comfortably, in NAD Eyes: EOMI, conjunctiva normal ENTM: MMM Neck: supple, normal ROM Cardiovascular: RRR, no murmurs Respiratory: CTAB, normal effort on room air Gastrointestinal: soft, nontender, nondistended, + bowel sound MSK: moving all limbs equally Derm: 2 ulcers on L foot. On lateral base of 5th metatarsal with some serosanguineous drainage, very TTP with surrounding erythema. Ulcer on L heel with some black eschar without drainage, no TTP.  Neuro: awake and alert, no focal deficits Psych: appropriate affect   Labs and Imaging: CBC BMET  Recent Labs  Lab 01/27/18 0429  WBC 11.7*  HGB 12.1  HCT 35.6*  PLT 306   Recent Labs  Lab 01/27/18 0429  NA 135  K 4.0  CL 100*  CO2 22  BUN 10  CREATININE 0.48  GLUCOSE 393*  CALCIUM 9.1      Dg Foot Complete Left  Result Date: 01/26/2018 CLINICAL DATA:  Ulcer and pain on the little digit. Evaluate for osteomyelitis. EXAM: LEFT FOOT - COMPLETE 3+ VIEW COMPARISON:  09/26/2017 FINDINGS: Soft tissue irregularity and bandage over the fifth metatarsal head. No evidence of erosion, soft tissue gas, or opaque foreign body. No fracture deformity. Heel ulcer and bandage also noted.  No adjacent acute bony finding. Extensive arterial calcification. Dystrophic soft tissue calcifications in the lower leg. IMPRESSION: No evidence of osteomyelitis. No opaque foreign body or soft tissue gas. Electronically Signed   By: Monte Fantasia M.D.   On: 01/26/2018 18:17    Bufford Lope, DO 01/27/2018, 6:29 AM PGY-2, [place] Intern pager:  [phone], text pages welcome

## 2018-01-27 NOTE — ED Notes (Signed)
CBG: 376. RN notified.

## 2018-01-27 NOTE — ED Notes (Signed)
Meal tray ordered. Will administer insulin when tray arrives

## 2018-01-27 NOTE — ED Provider Notes (Signed)
[facility] EMERGENCY DEPARTMENT Provider Note   CSN: [id number] Arrival date & time: 01/27/18  0131     History   Chief Complaint Chief Complaint  Patient presents with  . Foot Pain    HPI [name] is a 42 y.o. female.  HPI Patient is a 42 year old female who is been on antibiotics for the past 2 weeks for a possible developing foot infection who presents to the emergency department at the request of her wound care team for worsening swelling and redness of her left foot spreading under her left ankle.  She denies fever or chills but reports the swelling and pain is worsening.  She has a wound to the lateral distal aspect of her left foot.  No drainage.  Pain is moderate in severity and worse with ambulation and palpation of her left foot.   Past Medical History:  Diagnosis Date  . Alveolar hypoventilation   . Anemia    not on iron pill  . Arthritis   . Asthma   . Bipolar 2 disorder ([place])   . Carpal tunnel syndrome on right    recurrent  . Cellulitis 08/2010-08/2011  . Chronic pain   . COPD (chronic obstructive pulmonary disease) ([name])    Symbicort daily and Proventil as needed  . Costochondritis   . Depression   . Diabetes mellitus 2000   Type 2, Uncontrolled.Takes Lantus daily.Fasting blood sugar runs 150  . Dizziness    occasionally  . Drug-seeking behavior   . GERD (gastroesophageal reflux disease)    takes Pantoprazole and Zantac daily  . Headache    migraine-last one about a yr ago.Topamax daily  . HLD (hyperlipidemia)    takes Atorvastatin daily  . Hypertension    takes Lisinopril and Coreg daily  . Morbid obesity ([place])   . Muscle spasm    takes Flexeril as needed  . Nocturia   . Obstructive sleep apnea   . Peripheral neuropathy    takes Gabapentin daily  . Pneumonia    "walking" several yrs ago and as a baby  . Rectal fissure   . Restless leg   . Urinary frequency   . Varicose veins    Right medial thigh and Left leg      Patient Active Problem List   Diagnosis Date Noted  . Diabetic foot ulcer ([place]) 10/29/2017  . Ulcer of skin ([place]) 10/14/2017  . Decreased pedal pulses 06/10/2017  . Adjustment disorder with mixed anxiety and depressed mood 11/04/2016  . History of medication noncompliance 11/04/2016  . Unilateral primary osteoarthritis, right knee 10/22/2016  . Diabetic neuropathy ([place]) 07/14/2016  . Syncope 02/25/2016  . De Quervain's tenosynovitis, bilateral 11/01/2015  . Vitamin D deficiency 09/05/2015  . Recurrent candidiasis of vagina 09/05/2015  . Varicose veins of leg with complications 32/20/2542  . Neuropathy, intercostal nerve 12/17/2014  . Rectal itching 11/17/2014  . Restless leg syndrome 10/17/2014  . Chronic sinusitis 07/18/2014  . Headache 07/15/2014  . Vision, loss, sudden 07/12/2014  . Urge incontinence 10/15/2013  . Encounter for chronic pain management 06/30/2013  . Low back pain 01/31/2013  . HLD (hyperlipidemia) 11/19/2012  . Chest pain 06/27/2012  . Right carpal tunnel syndrome 09/01/2011  . Bilateral knee pain 09/01/2011  . Insulin dependent diabetes mellitus ([place]) 05/22/2008  . Morbid obesity ([place]) 05/22/2008  . 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 Depression 05/22/2008  . Obstructive sleep apnea 05/22/2008  . Essential hypertension 05/22/2008  . Asthma 05/22/2008  . GERD  05/22/2008    Past Surgical History:  Procedure Laterality Date  . CARPAL TUNNEL RELEASE    . CESAREAN SECTION    . KNEE ARTHROSCOPY Right 07/17/2010  . LEFT HEART CATHETERIZATION WITH CORONARY ANGIOGRAM N/A 07/27/2012   Procedure: LEFT HEART CATHETERIZATION WITH CORONARY ANGIOGRAM;  Surgeon: Sherren Mocha, MD;  Location[facility] C[facility] LAB;  Service: Cardiovascular;  Laterality: N/A;  . left knee surgery     screws she thinks  . MASS EXCISION N/A 06/29/2013   Procedure:  WIDE LOCAL EXCISION OF POSTERIOR NECK ABSCESS;  Surgeon: Ralene Ok, MD;  Location: [place];  Service: General;   Laterality: N/A;    OB History    Gravida Para Term Preterm AB Living   _0 SAB TAB Ectopic Multiple Live Births   1               Home Medications    Prior to Admission medications   Medication Sig Start Date End Date Taking? Authorizing Provider  ACCU-CHEK GUIDE test strip USE T0 CHECK BLOOD SUGAR 3 TIMES DAILY 07/26/17   [name], MD  ACCU-CHEK SOFTCLIX LANCETS lancets Use as instructed to check blood sugar 3 times per day 04/07/17   [name], MD  albuterol (PROVENTIL HFA;VENTOLIN HFA) 108 (90 Base) MCG/ACT inhaler Inhale 1-2 puffs into the lungs every 6 (six) hours as needed for wheezing or shortness of breath. 01/17/18   [name], MD  atorvastatin (LIPITOR) 20 MG tablet Take 1 tablet (20 mg total) by mouth daily. 03/23/17   [name], MD  Blood Glucose Monitoring Suppl (ACCU-CHEK NANO SMARTVIEW) w/Device KIT Use to check blood sugar 3 times daily 04/07/17   [name], MD  carvedilol (COREG) 3.125 MG tablet TAKE ONE (1) TABLET BY MOUTH TWO (2) TIMES DAILY 12/06/17   [name], MD  Cholecalciferol 1000 units tablet Take 1 tablet (1,000 Units total) by mouth daily. 04/05/17   [name], MD  cyclobenzaprine (FLEXERIL) 5 MG tablet TAKE 1 TABLET EVERY DAY AS NEEDED FOR MUSCLE SPASMS 11/22/17   [name], MD  gabapentin (NEURONTIN) 600 MG tablet Take 2 tablets (1,200 mg total) by mouth 3 (three) times daily. 01/25/18   [name], MD  HYDROcodone-acetaminophen (NORCO) 10-325 MG tablet Take 1 tablet by mouth every 8 (eight) hours as needed for moderate pain. 01/25/18   [name], MD  insulin aspart (NOVOLOG) 100 UNIT/ML injection Inject 25 Units into the skin daily with supper.    [provider]  Insulin Degludec (TRESIBA FLEXTOUCH) 200 UNIT/ML SOPN Inject 106 Units into the skin daily with breakfast. 01/17/18   [name], MD  Insulin Pen Needle (NOVOFINE) 32G X 6 MM MISC  1 Units by Does not apply route 3 (three) times daily. 07/28/17   [name], MD  Insulin Pen Needle (PEN NEEDLES) 31G X 6 MM MISC Use to inject insulin daily 03/23/17   [name], MD  liraglutide (VICTOZA) 18 MG/3ML SOPN INJECT 0.3 MILLILITERS (1.8MG TOTAL) INTO THE SKIN DAILY 01/17/18   [name], MD  lisinopril (PRINIVIL,ZESTRIL) 5 MG tablet Take 1 tablet (5 mg total) by mouth daily. 11/02/17   [name], MD  naloxone [facility]) nasal spray 4 mg/0.1 mL 1 spray into the nose as needed for overdose Patient taking differently: Place 1 spray into the nose once as needed (overdose). 1 spray into the nose as  needed for overdose 03/23/17   [name], MD  pantoprazole (PROTONIX) 40 MG tablet Take 1 tablet (40 mg total) by mouth daily. 09/01/17   Lind Covert, MD  SYMBICORT 160-4.5 MCG/ACT inhaler INHALE 2 PUFFS INTO THE LUNGS 2 TIMES DAILY 08/23/17   [name], MD    Family History Family History  Problem Relation Age of Onset  . Diabetes Mother   . Hyperlipidemia Mother   . Depression Mother   . Varicose Veins Mother   . Heart attack Paternal Uncle   . Heart disease Paternal Grandmother   . Heart attack Paternal Grandmother   . Heart attack Paternal Grandfather   . Heart disease Paternal Grandfather   . Heart attack Father   . Cancer Maternal Grandmother        COLON  . Hypertension Maternal Grandmother   . Hyperlipidemia Maternal Grandmother   . Diabetes Maternal Grandmother   . Other Maternal Grandfather        GUN SHOT    Social History Social History   Tobacco Use  . Smoking status: Former Smoker    Packs/day: 0.30    Years: 0.30    Pack years: 0.09    Types: Cigarettes    Last attempt to quit: 12/06/1993    Years since quitting: 24.1  . Smokeless tobacco: Never Used  Substance Use Topics  . Alcohol use: No    Alcohol/week: 0.0 oz  . Drug use: Yes    Comment: OD attempts on home meds       Allergies     Kiwi extract and Nubain [nalbuphine hcl]   Review of Systems Review of Systems  All other systems reviewed and are negative.    Physical Exam Updated Vital Signs BP 137/78 (BP Location: Right Arm)   Pulse 98   Temp 97.8 F (36.6 C) (Oral)   Ht '5\' 2"'$  (1.575 m)   Wt (!) 144.7 kg (319 lb)   LMP 01/06/2018 (Approximate)   SpO2 97%   BMI 58.35 kg/m   Physical Exam  Constitutional: She is oriented to person, place, and time. She appears well-developed and well-nourished.  HENT:  Head: Normocephalic.  Eyes: EOM are normal.  Neck: Normal range of motion.  Pulmonary/Chest: Effort normal.  Abdominal: She exhibits no distension.  Musculoskeletal: Normal range of motion.  Erythema and swelling of the left foot with spreading of the erythema and warmth to the dorsum of the left foot and anterior left ankle.  Normal pulses in left foot.  Chronic appearing wound of the distal aspect of her lateral left foot without drainage.  Neurological: She is alert and oriented to person, place, and time.  Psychiatric: She has a normal mood and affect.  Nursing note and vitals reviewed.    ED Treatments / Results  Labs (all labs ordered are listed, but only abnormal results are displayed) Labs Reviewed  CULTURE, BLOOD (ROUTINE X 2)  CULTURE, BLOOD (ROUTINE X 2)  CBC  BASIC METABOLIC PANEL    EKG  EKG Interpretation None       Radiology Dg Foot Complete Left  Result Date: 01/26/2018 CLINICAL DATA:  Ulcer and pain on the little digit. Evaluate for osteomyelitis. EXAM: LEFT FOOT - COMPLETE 3+ VIEW COMPARISON:  09/26/2017 FINDINGS: Soft tissue irregularity and bandage over the fifth metatarsal head. No evidence of erosion, soft tissue gas, or opaque foreign body. No fracture deformity. Heel ulcer and bandage also noted.  No adjacent acute bony finding. Extensive arterial calcification.  Dystrophic soft tissue calcifications in the lower leg. IMPRESSION: No evidence of osteomyelitis. No  opaque foreign body or soft tissue gas. Electronically Signed   By: Monte Fantasia M.D.   On: 01/26/2018 18:17    Procedures Procedures (including critical care time)  Medications Ordered in ED Medications  morphine 4 MG/ML injection 6 mg (not administered)  piperacillin-tazobactam (ZOSYN) IVPB 3.375 g (not administered)  vancomycin (VANCOCIN) 2,500 mg in sodium chloride 0.9 % 500 mL IVPB (not administered)     Initial Impression / Assessment and Plan / ED Course  I have reviewed the triage vital signs and the nursing notes.  Pertinent labs & imaging results that were available during my care of the patient were reviewed by me and considered in my medical decision making (see chart for details).     Worsening cellulitis of her left foot and spreading to her left ankle despite oral antibiotics.  Patient is failed outpatient treatment.  She will need IV antibiotics and admission the hospital especially given her history of diabetes.  X-ray without signs of osteomyelitis.  Patient will likely need MRI of the left foot and ankle  Final Clinical Impressions(s) / ED Diagnoses   Final diagnoses:  Cellulitis of left foot    ED Discharge Orders    None       Jola Schmidt, MD 01/27/18 [phone]

## 2018-01-27 NOTE — ED Notes (Signed)
Heart Healthy/Carb Modified Lunch Tray Ordered @ 1100-per RN-called by Levada Dy

## 2018-01-28 DIAGNOSIS — M86272 Subacute osteomyelitis, left ankle and foot: Secondary | ICD-10-CM

## 2018-01-28 LAB — BASIC METABOLIC PANEL
ANION GAP: 16 — AB (ref 5–15)
BUN: 17 mg/dL (ref 6–20)
CHLORIDE: 98 mmol/L — AB (ref 101–111)
CO2: 20 mmol/L — AB (ref 22–32)
Calcium: 8.5 mg/dL — ABNORMAL LOW (ref 8.9–10.3)
Creatinine, Ser: 0.76 mg/dL (ref 0.44–1.00)
GFR calc Af Amer: >60 mL/min (ref >60)
GFR calc non Af Amer: >60 mL/min (ref >60)
GLUCOSE: 374 mg/dL — AB (ref 65–99)
POTASSIUM: 4.1 mmol/L (ref 3.5–5.1)
Sodium: 134 mmol/L — ABNORMAL LOW (ref 135–145)

## 2018-01-28 LAB — CBC
HEMATOCRIT: 35 % — AB (ref 36.0–46.0)
HEMOGLOBIN: 11.3 g/dL — AB (ref 12.0–15.0)
MCH: 29.6 pg (ref 26.0–34.0)
MCHC: 32.3 g/dL (ref 30.0–36.0)
MCV: 91.6 fL (ref 78.0–100.0)
Platelets: 301 10*3/uL (ref 150–400)
RBC: 3.82 MIL/uL — AB (ref 3.87–5.11)
RDW: 13.1 % (ref 11.5–15.5)
WBC: 12.8 10*3/uL — AB (ref 4.0–10.5)

## 2018-01-28 LAB — GLUCOSE, CAPILLARY
GLUCOSE-CAPILLARY: 331 mg/dL — AB (ref 65–99)
GLUCOSE-CAPILLARY: 316 mg/dL — AB (ref 65–99)
Glucose-Capillary: 366 mg/dL — ABNORMAL HIGH (ref 65–99)
Glucose-Capillary: 340 mg/dL — ABNORMAL HIGH (ref 65–99)

## 2018-01-28 MED ORDER — INSULIN ASPART 100 UNIT/ML ~~LOC~~ SOLN
0.0000 [IU] | Freq: Three times a day (TID) | SUBCUTANEOUS | Status: DC
  Administered 2018-01-29: 11 [IU] via SUBCUTANEOUS
  Administered 2018-01-29: 20 [IU] via SUBCUTANEOUS
  Administered 2018-01-29: 11 [IU] via SUBCUTANEOUS

## 2018-01-28 NOTE — Progress Notes (Signed)
Pt requesting to change her diet from heart healthy carb mod to carb mod only, Dr. Kathlen Brunswick informed, no new order made at this time.

## 2018-01-28 NOTE — Progress Notes (Signed)
Patient stated that she uses c-pap at home and that she had her machine with her and that she requested assistance from RT and distilled water. MD on call was paged for order for use of home c-pap when TR arrived to patient her machine was lacking water dispenser so not able to use her home device. RT stated that a new order for c-papis needed to alert RT. New order for c-pap placed. Arthor Captain LPN

## 2018-01-28 NOTE — Progress Notes (Signed)
Family Medicine Teaching Service Daily Progress Note Intern Pager: [phone]  Patient name: [name] Medical record number: [id number] Date of birth: [birth year] Age: 42 y.o. Gender: female  Primary Care Provider: [name], MD Consultants: Orthopedic surgery  Code Status: Full   Pt Overview and Major Events to Date:  Admitted to [facility] on 01/27/2018  Assessment and Plan: [name] is a 42 y.o. female presenting with L foot ulcer. PMH is significant for uncontrolled T2DM, COPD, HLD, HTN, OSA, bipolar disorder.  L foot diabetic foot ulcer Failed outpatinet doxycyline and ciprofloxacin. Newly worsening ulcer on lateral base of 5th MTP with increasing erythema and pain over the last 1 week. MR foot concerning for osteomyelitis. Dr. Sharol Given, orthopedic surgery, consulted with recommendations of amputation of 5th metatarsal head and little toe. Per patient request, would like to defer surgery and have trial of IV antibiotics first. Increased leukocytosis to 12.8, but afebrile and well appearing, no concern for sepsis at this time.  - continue IV Vanc/zosyn (2/22-) - BCx pending - continue home norco 3-344m q8prn - morphine 429mq4prn for breakthrough  Type 2 DM with diabetic neuropathy Uncontrolled. A1C of 13.8 on 01/27/2017. CBGs consistently elevated. Home meds: Tresiba 106U daily with 15U at lunchtime and 25U with supper of novolog with supper. - mSSI - lantus 52U bid  - monitor CBGs - continue gabapentin 120056mid   COPD Stable. On symbicort 2 puffs BID with prn albuterol - dulera while admitted - prn albuterol  OA Chronic, stable. Chronic norco 10-325m41m for pain relief with flexeril prn.  - continue home norco  HTN Stable. Currently normotensive at 127/66. Home meds: coreg 3.125mg18m, lisinopril 5mg q12m continue home meds   HLD Home meds: atorvastatin 20mg d54m  - continue home atorvastatin  FEN/GI: heart healthy carb modified diet,  protonix Prophylaxis: lovenox  Disposition: continued inpatient stay for IV abx   Subjective:  Patient doing well. States still has lots of pain in left foot. Denies fever or chills. Denies nausea, vomiting, diarrhea. Reports no questions at this time   Objective: Temp:  [97.3 F (36.3 C)-99.7 F (37.6 C)] 99.7 F (37.6 C) (02/23 0547) Pulse Rate:  [85-100] 96 (02/23 0547) Resp:  [18] 18 (02/23 0547) BP: (107-127)/(48-73) 127/66 (02/23 0547) SpO2:  [93 %-97 %] 94 % (02/23 0547) Weight:  [322 lb 8.5 oz (146.3 kg)] 322 lb 8.5 oz (146.3 kg) (02/22 1256) Physical Exam: General: awake and alert, laying in bed, CPAP on as she was asleep prior to my arrival  Cardiovascular: RRR, no MRG Respiratory: CTAB, no wheezes, rales, or rhonchi  Abdomen: soft, non tender, non distended  Extremities: tenderness to palpation of left foot.  Skin: no change in erythema compared to marked borders. No active pustular drainage.   Laboratory: Recent Labs  Lab 01/26/18 1724 01/27/18 0429 01/28/18 0601  WBC 11.2* 11.7* 12.8*  HGB 12.2 12.1 11.3*  HCT 35.7* 35.6* 35.0*  PLT 320 306 301   Recent Labs  Lab 01/26/18 1724 01/27/18 0429 01/28/18 0601  NA 137 135 134*  K 4.0 4.0 4.1  CL 101 100* 98*  CO2 25 22 20*  BUN '10 10 17  ' CREATININE 0.55 0.48 0.76  CALCIUM 8.9 9.1 8.5*  GLUCOSE 372* 393* 374*   CBG (last 3)  Recent Labs    01/27/18 1634 01/27/18 2242 01/28/18 0842  GLUCAP 299* 301* 366*     Results for orders placed or performed during the hospital encounter of 01/12/18  Aerobic Culture (superficial specimen)     Status: None   Collection Time: 01/11/18  3:00 PM  Result Value Ref Range Status   Specimen Description FOOT LEFT  Final   Special Requests 5TH MET HEAD  Final   Gram Stain NO WBC SEEN RARE GRAM POSITIVE COCCI   Final   Culture   Final    ABUNDANT SERRATIA MARCESCENS MODERATE GROUP B STREP(S.AGALACTIAE)ISOLATED TESTING AGAINST S. AGALACTIAE NOT ROUTINELY  PERFORMED DUE TO PREDICTABILITY OF AMP/PEN/VAN SUSCEPTIBILITY. Performed at [facility], [place] [address] [place], [place] [address]    Report Status 01/14/2018 FINAL  Final   Organism ID, Bacteria SERRATIA MARCESCENS  Final      Susceptibility   Serratia marcescens - MIC*    CEFAZOLIN >=64 RESISTANT Resistant     CEFEPIME <=1 SENSITIVE Sensitive     CEFTAZIDIME <=1 SENSITIVE Sensitive     CEFTRIAXONE <=1 SENSITIVE Sensitive     CIPROFLOXACIN <=0.25 SENSITIVE Sensitive     GENTAMICIN <=1 SENSITIVE Sensitive     TRIMETH/SULFA <=20 SENSITIVE Sensitive     * ABUNDANT SERRATIA MARCESCENS    Imaging/Diagnostic Tests: Mr Foot Left Wo Contrast  Result Date: 01/27/2018 CLINICAL DATA:  Worsening ulcer along the lateral base of the fifth MTP joint. EXAM: MRI OF THE LEFT FOOT WITHOUT CONTRAST TECHNIQUE: Multiplanar, multisequence MR imaging of the left forefoot was performed. No intravenous contrast was administered. COMPARISON:  None. FINDINGS: Bones/Joint/Cartilage Soft tissue ulcer along the lateral aspect of the fifth metatarsal head. Mild cortical irregularity and bone marrow edema in the fifth metatarsal head. No other marrow signal abnormality. No fracture or dislocation. Normal alignment. No joint effusion. Mild osteoarthritis of third, fourth and fifth tarsometatarsal joints. Ligaments Collateral ligaments are intact.  Lisfranc joint is intact. Muscles and Tendons Flexor, peroneal and extensor compartment tendons are intact. Muscles are normal. Soft tissue No fluid collection or hematoma. No soft tissue mass. Soft tissue edema along the dorsal aspect of the foot and more severe around the fifth MTP joint. IMPRESSION: Soft tissue ulcer along the lateral aspect of the fifth metatarsal head. Mild cortical irregularity and bone marrow edema in the fifth metatarsal head concerning for osteomyelitis. Electronically Signed   By: Kathreen Devoid   On: 01/27/2018 11:31   Dg Foot Complete Left  Result  Date: 01/26/2018 CLINICAL DATA:  Ulcer and pain on the little digit. Evaluate for osteomyelitis. EXAM: LEFT FOOT - COMPLETE 3+ VIEW COMPARISON:  09/26/2017 FINDINGS: Soft tissue irregularity and bandage over the fifth metatarsal head. No evidence of erosion, soft tissue gas, or opaque foreign body. No fracture deformity. Heel ulcer and bandage also noted.  No adjacent acute bony finding. Extensive arterial calcification. Dystrophic soft tissue calcifications in the lower leg. IMPRESSION: No evidence of osteomyelitis. No opaque foreign body or soft tissue gas. Electronically Signed   By: Monte Fantasia M.D.   On: 01/26/2018 18:17     [name], DO 01/28/2018, 11:30 AM PGY-1, [place] Intern pager: [phone], text pages welcome

## 2018-01-28 NOTE — Discharge Summary (Signed)
[facility] Discharge Summary  Patient name: [name] Medical record number: [id number] Date of birth: 11/24/1976 Age: 42 y.o. Gender: female Date of Admission: 01/27/2018  Date of Discharge: 02/03/18 Admitting Physician: Lind Covert, MD  Primary Care Provider: [name], MD Consultants: Orthopedics   Indication for Hospitalization: L foot ulcer   Discharge Diagnoses/Problem List:  L foot diabetic foot ulcer w/ concerns of osteomyelitis T2DM w/ diabetic neuropathy Anemia  AKI COPD OA HTN HLD  Disposition: home with home health   Discharge Condition: stable, Improving   Discharge Exam:  General: awake and alert, laying in bed, NAD, in good spirits  Cardiovascular: RRR, no MRG Respiratory: CTAB, no wheezes, rales or rhonchi  Abdomen: soft, non tender, non distended, bowel sounds normal  Extremities: non tender, no edema, LLE with dressing placed   Brief Hospital Course:  [name] is a 42 y.o. female presenting for left foot ulcer needing IV abx. Patient is an uncontrolled diabetic, with A1C of 13.8, that has chronic left foot heel ulcer followed by wound care. Patient developed callus over 5th MTP that was debrided 3 weeks ago and tried doxycycline and ciprofloxacin due to new ulcer. Ulcer continued to worsen and MR foot was concerning for osteomyelitis. Dr. Sharol Given was consulted and recommended amputation of fifth metatarsal head and little toe. Patient refused amputation and wanted trial of IV antibiotics first. Patient was placed on IV vancomycin and zosyn from 01/27/18-2/25 and did not improve. Antibiotic regimen was switched to daptomycin and cefepime from 2/26-2/28 given AKI resulting from vancomycin use.. 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   On 2/28, patient was transition to augmentin and doxycycline on 2/28 for antibiotic coverage. ID  recommended stopping doxycycline and instead adding ciproflaxacin. Patient received 1 dose of doxycycline. Patient completed 5 day course of doxycycline and ciprofloaxacin. Blood cultures were NGTD.   During admission patient's blood sugars remained uncontrolled. Patient needed up to 60U lantus bid, 10 U TID w/ meals rSSI. On discharge plan to continue victoza, tresiba 80U daily, novolog 15U at lunchtime and 25U with supper   Issues for Follow Up:  1. Will need to have tight glycemic control given worsening wounds   -New plan: continue victoza, tresiba 80U daily, novolog 15U at  lunchtime and 25U with supper  -Will likely need to decrease insulin use as patient improves  2. Follow up in 1 week with Dr. Sharol Given 3. Continue oral antibiotics (augmentin and cipro) for 2 days (end 3/2)  4. Continue home PT 5. Monitor CBC. If anemic consider iron supplementation  6. Keep her foot elevated strict nonweightbearing keep the dressing dry follow-up in the office with Dr. Sharol Given in 1-2 weeks to change the dressing. 7. Monitor BP, have discontinued lisinopril  Significant Procedures:  02/01/18 - Fourth and fifth ray amputation   Significant Labs and Imaging:  Recent Labs  Lab 02/01/18 1623 02/02/18 0537 02/03/18 0528  WBC 10.2 12.8* 12.3*  HGB 9.1* 9.0* 8.5*  HCT 28.9* 27.2* 27.2*  PLT 332 335 388   Recent Labs  Lab 01/30/18 0730 01/31/18 0452 02/01/18 0631 02/02/18 0537 02/03/18 0528  NA 130* 132* 136 133* 136  K 3.8 4.0 3.9 4.3 4.3  CL 97* 99* 103 101 102  CO2 22 21* 22 21* 23  GLUCOSE 168* 228* 96 262* 167*  BUN 35* 41* 29* 33* 31*  CREATININE 1.90* 1.76* 1.22* 1.31*  1.27*  CALCIUM 7.6* 7.7* 8.1* 7.8* 8.3*   CBG (last 3)  Recent Labs    02/02/18 1816 02/02/18 2213 02/03/18 0733  GLUCAP 192* 220* 145*    Ref. Range 01/31/2018 10:38  Iron Latest Ref Range: 28 - 170 ug/dL 20 (L)  UIBC Latest Units: ug/dL 291  TIBC Latest Ref Range: 250 - 450 ug/dL 311  Saturation Ratios Latest  Ref Range: 10.4 - 31.8 % 6 (L)  Ferritin Latest Ref Range: 11 - 307 ng/mL 370 (H)  Folate Latest Ref Range: >5.9 ng/mL 14.6    Results for orders placed or performed during the hospital encounter of 01/27/18  Blood culture (routine x 2)     Status: None   Collection Time: 01/27/18  5:03 AM  Result Value Ref Range Status   Specimen Description BLOOD RIGHT ANTECUBITAL  Final   Special Requests   Final    BOTTLES DRAWN AEROBIC AND ANAEROBIC Blood Culture adequate volume   Culture   Final    NO GROWTH 5 DAYS Performed at [facility], [place] [address] [place], [place] [address]    Report Status 02/01/2018 FINAL  Final  Blood culture (routine x 2)     Status: None   Collection Time: 01/27/18  5:12 AM  Result Value Ref Range Status   Specimen Description BLOOD RIGHT FOREARM  Final   Special Requests   Final    BOTTLES DRAWN AEROBIC AND ANAEROBIC Blood Culture adequate volume   Culture   Final    NO GROWTH 5 DAYS Performed at [facility], [place] [address] [place], [place] [address]    Report Status 02/01/2018 FINAL  Final  Blood culture (routine x 2)     Status: None (Preliminary result)   Collection Time: 01/29/18  9:33 PM  Result Value Ref Range Status   Specimen Description BLOOD LEFT ANTECUBITAL  Final   Special Requests   Final    BOTTLES DRAWN AEROBIC AND ANAEROBIC Blood Culture adequate volume   Culture   Final    NO GROWTH 4 DAYS Performed at [facility], [place] [address] [place], [place] [address]    Report Status PENDING  Incomplete  Surgical pcr screen     Status: None   Collection Time: 01/31/18 11:26 PM  Result Value Ref Range Status   MRSA, PCR NEGATIVE NEGATIVE Final   Staphylococcus aureus NEGATIVE NEGATIVE Final    Comment: (NOTE) The Xpert SA Assay (FDA approved for NASAL specimens in patients 33 years of age and older), is one component of a comprehensive surveillance program. It is not intended to diagnose infection nor to guide or monitor  treatment. Performed at [facility], [place] [address], [place], [place] [address]     Mr Foot Left Wo Contrast  Result Date: 01/27/2018 CLINICAL DATA:  Worsening ulcer along the lateral base of the fifth MTP joint. EXAM: MRI OF THE LEFT FOOT WITHOUT CONTRAST TECHNIQUE: Multiplanar, multisequence MR imaging of the left forefoot was performed. No intravenous contrast was administered. COMPARISON:  None. FINDINGS: Bones/Joint/Cartilage Soft tissue ulcer along the lateral aspect of the fifth metatarsal head. Mild cortical irregularity and bone marrow edema in the fifth metatarsal head. No other marrow signal abnormality. No fracture or dislocation. Normal alignment. No joint effusion. Mild osteoarthritis of third, fourth and fifth tarsometatarsal joints. Ligaments Collateral ligaments are intact.  Lisfranc joint is intact. Muscles and Tendons Flexor, peroneal and extensor compartment tendons are intact. Muscles are normal.  Soft tissue No fluid collection or hematoma. No soft tissue mass. Soft tissue edema along the dorsal aspect of the foot and more severe around the fifth MTP joint. IMPRESSION: Soft tissue ulcer along the lateral aspect of the fifth metatarsal head. Mild cortical irregularity and bone marrow edema in the fifth metatarsal head concerning for osteomyelitis. Electronically Signed   By: Kathreen Devoid   On: 01/27/2018 11:31   Dg Foot Complete Left  Result Date: 01/26/2018 CLINICAL DATA:  Ulcer and pain on the little digit. Evaluate for osteomyelitis. EXAM: LEFT FOOT - COMPLETE 3+ VIEW COMPARISON:  09/26/2017 FINDINGS: Soft tissue irregularity and bandage over the fifth metatarsal head. No evidence of erosion, soft tissue gas, or opaque foreign body. No fracture deformity. Heel ulcer and bandage also noted.  No adjacent acute bony finding. Extensive arterial calcification. Dystrophic soft tissue calcifications in the lower leg. IMPRESSION: No evidence of osteomyelitis. No opaque foreign body  or soft tissue gas. Electronically Signed   By: Monte Fantasia M.D.   On: 01/26/2018 18:17     Results/Tests Pending at Time of Discharge:  Unresulted Labs (From admission, onward)   Start     Ordered   02/03/18 0500  CBC  Daily,   R    Question:  Specimen collection method  Answer:  Lab=Lab collect   02/02/18 0720   01/31/18 0630  Basic metabolic panel  Daily,   R    Question:  Specimen collection method  Answer:  Lab=Lab collect   01/30/18 1200      Discharge Medications:  Allergies as of 02/03/2018      Reactions   Kiwi Extract Shortness Of Breath, Swelling   Nubain [nalbuphine Hcl] Other (See Comments)   "FEELS LIKE SOMETHING CRAWLING ON ME"      Medication List    STOP taking these medications   cyclobenzaprine 5 MG tablet Commonly known as:  FLEXERIL   lisinopril 5 MG tablet Commonly known as:  PRINIVIL,ZESTRIL     TAKE these medications   ACCU-CHEK GUIDE test strip Generic drug:  glucose blood USE T0 CHECK BLOOD SUGAR 3 TIMES DAILY   ACCU-CHEK NANO SMARTVIEW w/Device Kit Use to check blood sugar 3 times daily   ACCU-CHEK SOFTCLIX LANCETS lancets Use as instructed to check blood sugar 3 times per day   albuterol 108 (90 Base) MCG/ACT inhaler Commonly known as:  PROVENTIL HFA;VENTOLIN HFA Inhale 1-2 puffs into the lungs every 6 (six) hours as needed for wheezing or shortness of breath.   amoxicillin-clavulanate 875-125 MG tablet Commonly known as:  AUGMENTIN Take 1 tablet by mouth every 12 (twelve) hours.   atorvastatin 20 MG tablet Commonly known as:  LIPITOR Take 1 tablet (20 mg total) by mouth daily.   carvedilol 3.125 MG tablet Commonly known as:  COREG TAKE ONE (1) TABLET BY MOUTH TWO (2) TIMES DAILY   Cholecalciferol 1000 units tablet Take 1 tablet (1,000 Units total) by mouth daily.   ciprofloxacin 500 MG tablet Commonly known as:  CIPRO Take 1 tablet (500 mg total) by mouth 2 (two) times daily.   gabapentin 600 MG tablet Commonly known  as:  NEURONTIN Take 2 tablets (1,200 mg total) by mouth 3 (three) times daily.   HYDROcodone-acetaminophen 10-325 MG tablet Commonly known as:  NORCO Take 1 tablet by mouth every 8 (eight) hours as needed for moderate pain.   insulin aspart 100 UNIT/ML injection Commonly known as:  novoLOG Inject 25 Units into the skin daily with supper. What  changed:  Another medication with the same name was added. Make sure you understand how and when to take each.   insulin aspart 100 UNIT/ML injection Commonly known as:  NOVOLOG Inject 15 Units into the skin daily with lunch. What changed:  You were already taking a medication with the same name, and this prescription was added. Make sure you understand how and when to take each.   Insulin Degludec 200 UNIT/ML Sopn Commonly known as:  TRESIBA FLEXTOUCH Inject 80 Units into the skin daily with breakfast. What changed:  how much to take   liraglutide 18 MG/3ML Sopn Commonly known as:  VICTOZA INJECT 0.3 MILLILITERS (1.8MG TOTAL) INTO THE SKIN DAILY   naloxone 4 MG/0.1ML Liqd nasal spray kit Commonly known as:  NARCAN 1 spray into the nose as needed for overdose What changed:    how much to take  how to take this  when to take this  reasons to take this  additional instructions   pantoprazole 40 MG tablet Commonly known as:  PROTONIX Take 1 tablet (40 mg total) by mouth daily.   Pen Needles 31G X 6 MM Misc Use to inject insulin daily   Insulin Pen Needle 32G X 6 MM Misc Commonly known as:  NOVOFINE 1 Units by Does not apply route 3 (three) times daily.   polyethylene glycol packet Commonly known as:  MIRALAX / GLYCOLAX Take 17 g by mouth daily as needed for mild constipation.   SYMBICORT 160-4.5 MCG/ACT inhaler Generic drug:  budesonide-formoterol INHALE 2 PUFFS INTO THE LUNGS 2 TIMES DAILY            Durable Medical Equipment  (From admission, onward)        Start     Ordered   02/02/18 1630  For home use only  DME Walker rolling  Once    Comments:  Bariatric wt 322 lbs, ht 5'2"  Question:  Patient needs a walker to treat with the following condition  Answer:  Diabetic foot ulcer with osteomyelitis ([place])   02/02/18 1631   02/02/18 1624  For home use only DME high strength lightweight manual wheelchair with seat cushion  Once    Comments:  Patient suffers from L lateral base 5th MTP foot diabetic foot ulcer with osteomyelitis s/p amputation  which impairs their ability to perform daily activities like ambulating  in the home.  A cane will not resolve  issue with performing activities of daily living. A wheelchair will allow patient to safely perform daily activities.   Accessories: elevating leg rests (ELRs), wheel locks, extensions and anti-tippers.   20 x 18 HD Bariatric wt 322 lbs, ht 5'2"   02/02/18 1629   02/02/18 1618  For home use only DME 3 n 1  Once    Comments:  Bariatric   02/02/18 1617   02/02/18 1320  For home use only DME Walker rolling  Once    Comments:  Bariatric wt 322 lbs, ht 5'2"  Question:  Patient needs a walker to treat with the following condition  Answer:  Diabetic foot ulcer with osteomyelitis ([place])   02/02/18 1320       Discharge Care Instructions  (From admission, onward)        Start     Ordered   02/03/18 0000  Non weight bearing    Question Answer Comment  Laterality left   Extremity Lower      02/03/18 0808      Discharge Instructions: Please refer  to Patient Instructions section of EMR for full details.  Patient was counseled important signs and symptoms that should prompt return to medical care, changes in medications, dietary instructions, activity restrictions, and follow up appointments.   Follow-Up Appointments: Follow-up Information    [name], MD Follow up in 2 week(s).   Specialty:  Orthopedic Surgery Contact information: [place] [address] [phone]        Guadalupe Dawn, MD. Go on 02/08/2018.    Specialty:  Family Medicine Why:  '@3' :05pm (please arrive at least 36mn early). Please bring a log of your sugars.  Contact information: [id number] DO 02/03/2018, 11:04 AM PGY-1, [place]

## 2018-01-28 NOTE — Progress Notes (Signed)
Patient states she has her home CPAP unit with her. She states her husband will set it up and she is able to place self on and off when ready. RT informed patient if she has any trouble have RN contact RT.

## 2018-01-28 NOTE — Progress Notes (Signed)
Belinda, Hall ([id number]) Visit Report for 01/26/2018 Arrival Information Details Patient Name: [name], [name] Date of Service: 01/26/2018 2:45 PM Medical Record Number: [id number] Patient Account Number: [id number] Date of Birth/Sex: November 29, 1976 (42 y.o. Female) Treating RN: Montey Hora Primary Care Preslie Depasquale: [name] Other Clinician: Referring Nnaemeka Samson: [name] Treating Su Duma/Extender: Cathie Olden in Treatment: 5 Visit Information History Since Last Visit Added or deleted any medications: No Patient Arrived: Ambulatory Any new allergies or adverse reactions: No Arrival Time: 14:26 Had a fall or experienced change in No Accompanied By: dtr activities of daily living that may affect Transfer Assistance: None risk of falls: Patient Identification Verified: Yes Signs or symptoms of abuse/neglect since last visito No Secondary Verification Process Completed: Yes Hospitalized since last visit: No Patient Requires Transmission-Based No Has Dressing in Place as Prescribed: Yes Precautions: Pain Present Now: Yes Patient Has Alerts: Yes Patient Alerts: DMII Electronic Signature(s) Signed: 01/26/2018 5:33:45 PM By: Montey Hora Entered By: Montey Hora on 01/26/2018 14:27:03 [name] ([id number]) -------------------------------------------------------------------------------- Clinic Level of Care Assessment Details Patient Name: [name] Date of Service: 01/26/2018 2:45 PM Medical Record Number: [id number] Patient Account Number: [id number] Date of Birth/Sex: 1976/04/10 (42 y.o. Female) Treating RN: Carolyne Fiscal, Debi Primary Care Mackson Botz: [name] Other Clinician: Referring Bryan Omura: [name] Treating Marlee Trentman/Extender: Cathie Olden in Treatment: 5 Clinic Level of Care Assessment Items TOOL 4 Quantity Score X - Use when only an EandM is performed on FOLLOW-UP visit 1 0 ASSESSMENTS - Nursing Assessment / Reassessment X  - Reassessment of Co-morbidities (includes updates in patient status) 1 10 X- 1 5 Reassessment of Adherence to Treatment Plan ASSESSMENTS - Wound and Skin Assessment / Reassessment []  - Simple Wound Assessment / Reassessment - one wound 0 X- 2 5 Complex Wound Assessment / Reassessment - multiple wounds []  - 0 Dermatologic / Skin Assessment (not related to wound area) ASSESSMENTS - Focused Assessment []  - Circumferential Edema Measurements - multi extremities 0 []  - 0 Nutritional Assessment / Counseling / Intervention []  - 0 Lower Extremity Assessment (monofilament, tuning fork, pulses) []  - 0 Peripheral Arterial Disease Assessment (using hand held doppler) ASSESSMENTS - Ostomy and/or Continence Assessment and Care []  - Incontinence Assessment and Management 0 []  - 0 Ostomy Care Assessment and Management (repouching, etc.) PROCESS - Coordination of Care X - Simple Patient / Family Education for ongoing care 1 15 []  - 0 Complex (extensive) Patient / Family Education for ongoing care []  - 0 Staff obtains Programmer, systems, Records, Test Results / Process Orders []  - 0 Staff telephones HHA, Nursing Homes / Clarify orders / etc []  - 0 Routine Transfer to another Facility (non-emergent condition) []  - 0 Routine Hospital Admission (non-emergent condition) []  - 0 New Admissions / Biomedical engineer / Ordering NPWT, Apligraf, etc. []  - 0 Emergency Hospital Admission (emergent condition) X- 1 10 Simple Discharge Coordination DANICIA, TERHAAR ([id number]) []  - 0 Complex (extensive) Discharge Coordination PROCESS - Special Needs []  - Pediatric / Minor Patient Management 0 []  - 0 Isolation Patient Management []  - 0 Hearing / Language / Visual special needs []  - 0 Assessment of Community assistance (transportation, D/C planning, etc.) []  - 0 Additional assistance / Altered mentation []  - 0 Support Surface(s) Assessment (bed, cushion, seat, etc.) INTERVENTIONS - Wound Cleansing /  Measurement []  - Simple Wound Cleansing - one wound 0 X- 2 5 Complex Wound Cleansing - multiple wounds X- 1 5 Wound Imaging (photographs - any number of wounds) []  - 0 Wound Tracing (instead of photographs) []  -  0 Simple Wound Measurement - one wound X- 2 5 Complex Wound Measurement - multiple wounds INTERVENTIONS - Wound Dressings []  - Small Wound Dressing one or multiple wounds 0 X- 1 15 Medium Wound Dressing one or multiple wounds []  - 0 Large Wound Dressing one or multiple wounds X- 1 5 Application of Medications - topical []  - 0 Application of Medications - injection INTERVENTIONS - Miscellaneous []  - External ear exam 0 []  - 0 Specimen Collection (cultures, biopsies, blood, body fluids, etc.) []  - 0 Specimen(s) / Culture(s) sent or taken to Lab for analysis []  - 0 Patient Transfer (multiple staff / Civil Service fast streamer / Similar devices) []  - 0 Simple Staple / Suture removal (25 or less) []  - 0 Complex Staple / Suture removal (26 or more) []  - 0 Hypo / Hyperglycemic Management (close monitor of Blood Glucose) []  - 0 Ankle / Brachial Index (ABI) - do not check if billed separately X- 1 5 Vital Signs [name], Lanita ([id number]) Has the patient been seen at the hospital within the last three years: Yes Total Score: 100 Level Of Care: New/Established - Level 3 Electronic Signature(s) Signed: 01/27/2018 8:00:35 AM By: Alric Quan Entered By: Alric Quan on 01/26/2018 17:28:03 [name] ([id number]) -------------------------------------------------------------------------------- Encounter Discharge Information Details Patient Name: [name] Date of Service: 01/26/2018 2:45 PM Medical Record Number: [id number] Patient Account Number: [id number] Date of Birth/Sex: Mar 21, 1976 (42 y.o. Female) Treating RN: Carolyne Fiscal, Debi Primary Care Vlada Uriostegui: [name] Other Clinician: Referring Barry Faircloth: [name] Treating Ellyse Rotolo/Extender: Cathie Olden  in Treatment: 5 Encounter Discharge Information Items Schedule Follow-up Appointment: No Medication Reconciliation completed and No provided to Patient/Care Isobel Eisenhuth: Provided on Clinical Summary of Care: 01/26/2018 Form Type Recipient Paper Patient TB Electronic Signature(s) Signed: 01/27/2018 8:16:02 AM By: Ruthine Dose Entered By: Ruthine Dose on 01/26/2018 15:05:45 [name] ([id number]) -------------------------------------------------------------------------------- Lower Extremity Assessment Details Patient Name: [name] Date of Service: 01/26/2018 2:45 PM Medical Record Number: [id number] Patient Account Number: [id number] Date of Birth/Sex: Apr 28, 1976 (42 y.o. Female) Treating RN: Montey Hora Primary Care Annastyn Silvey: [name] Other Clinician: Referring Amamda Curbow: [name] Treating Josalin Carneiro/Extender: Cathie Olden in Treatment: 5 Vascular Assessment Pulses: Dorsalis Pedis Palpable: [Left:Yes] Posterior Tibial Extremity colors, hair growth, and conditions: Extremity Color: [Left:Normal] Hair Growth on Extremity: [Left:Yes] Temperature of Extremity: [Left:Warm] Capillary Refill: [Left:< 3 seconds] Electronic Signature(s) Signed: 01/26/2018 5:33:45 PM By: Montey Hora Entered By: Montey Hora on 01/26/2018 14:35:29 [name] ([id number]) -------------------------------------------------------------------------------- Multi Wound Chart Details Patient Name: [name] Date of Service: 01/26/2018 2:45 PM Medical Record Number: [id number] Patient Account Number: [id number] Date of Birth/Sex: 12/04/1976 (42 y.o. 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] [2:Lower Extremity] [N/A:N/A] Comorbid History: [1:Asthma, Hypertension, Type II Asthma, Hypertension, Type II Diabetes, Osteoarthritis, Neuropathy] [2:Diabetes, Osteoarthritis, Neuropathy] [N/A:N/A] Date Acquired: [1:10/03/2017] [2:01/02/2018] [N/A:N/A] Weeks of Treatment: [1:5] [2:2] [N/A:N/A] Wound Status: [1:Open] [2:Open] [N/A:N/A] Pending Amputation on [1:Yes] [2:No] [N/A:N/A] Presentation: Measurements L x W x D [1:0.3x0.3x0.1] [2:1x0.6x0.2] [N/A:N/A] (cm) Area (cm) : [1:0.071] [2:0.471] [N/A:N/A] Volume (cm) : [1:0.007] [2:0.094] [N/A:N/A] % Reduction in Area: [1:62.20%] [2:88.00%] [  N/A:N/A] % Reduction in Volume: [1:92.60%] [2:88.00%] [N/A:N/A] Classification: [1:Grade 2] [2:Grade 2] [N/A:N/A] Exudate Amount: [1:Medium] [2:Large] [N/A:N/A] Exudate Type: [1:Serous] [2:Serous] [N/A:N/A] Exudate Color: [1:amber] [2:amber] [N/A:N/A] Foul Odor After Cleansing: [1:No] [2:Yes] [N/A:N/A] Odor Anticipated Due to [1:N/A] [2:No] [N/A:N/A] Product Use: Wound Margin: [1:Flat and Intact] [2:Flat and Intact] [N/A:N/A] Granulation Amount: [1:Medium (34-66%)] [2:None Present (0%)] [N/A:N/A] Granulation Quality: [1:Pink] [2:N/A] [N/A:N/A] Necrotic Amount: [1:Medium (34-66%)] [2:Large (67-100%)] [N/A:N/A] Necrotic Tissue: [1:Eschar, Adherent Slough] [2:Eschar, Adherent Slough] [N/A:N/A] Exposed Structures: [1:Fat Layer (Subcutaneous Tissue) Exposed: Yes Fascia: No Tendon: No Muscle: No] [2:Fascia: No Fat Layer (Subcutaneous Tissue) Exposed: No Tendon: No Muscle: No] [N/A:N/A] Joint: No Joint: No Bone: No Bone: No Epithelialization: None None  N/A Periwound Skin Texture: Callus: Yes Excoriation: No N/A Excoriation: No Induration: No Induration: No Callus: No Crepitus: No Crepitus: No Rash: No Rash: No Scarring: No Scarring: No Periwound Skin Moisture: Maceration: No Maceration: Yes N/A Dry/Scaly: No Dry/Scaly: No Periwound Skin Color: Erythema: Yes Erythema: Yes N/A Atrophie Blanche: No Atrophie Blanche: No Cyanosis: No Cyanosis: No Ecchymosis: No Ecchymosis: No Hemosiderin Staining: No Hemosiderin Staining: No Mottled: No Mottled: No Pallor: No Pallor: No Rubor: No Rubor: No Erythema Location: Circumferential Circumferential N/A Temperature: No Abnormality No Abnormality N/A Tenderness on Palpation: Yes Yes N/A Wound Preparation: Ulcer Cleansing: Ulcer Cleansing: N/A Rinsed/Irrigated with Saline Rinsed/Irrigated with Saline Topical Anesthetic Applied: Topical Anesthetic Applied: Other: lidocaine 4% Other: lidocaine 4% Treatment Notes Electronic Signature(s) Signed: 01/26/2018 3:52:26 PM By: Lawanda Cousins Entered By: Lawanda Cousins on 01/26/2018 15:52:26 NALAYAH, HITT ([id number]) -------------------------------------------------------------------------------- [place] Details Patient Name: [name] Date of Service: 01/26/2018 2:45 PM Medical Record Number: [id number] Patient Account Number: [id number] Date of Birth/Sex: 21-Dec-1975 (42 y.o. Female) Treating RN: Carolyne Fiscal, Debi Primary Care Jannine Abreu: [name] Other Clinician: Referring Joselle Deeds: [name] Treating Sevin Langenbach/Extender: Cathie Olden in Treatment: 5 Active Inactive ` Abuse / Safety / Falls / Self Care Management Nursing Diagnoses: Impaired physical mobility Goals: Patient will not experience any injury related to falls Date Initiated: 12/16/2017 Target Resolution Date: 03/11/2018 Goal Status: Active Interventions: Assess fall risk on admission and as needed Notes: ` Orientation to  the Wound Care Program Nursing Diagnoses: Knowledge deficit related to the wound healing center program Goals: Patient/caregiver will verbalize understanding of the [facility] Date Initiated: 12/16/2017 Target Resolution Date: 03/11/2018 Goal Status: Active Interventions: Provide education on orientation to the wound center Notes: ` Wound/Skin Impairment Nursing Diagnoses: Impaired tissue integrity Goals: Ulcer/skin breakdown will heal within 14 weeks Date Initiated: 12/16/2017 Target Resolution Date: 03/11/2018 Goal Status: Active Interventions: TODD, ARGABRIGHT ([id number]) Assess patient/caregiver ability to obtain necessary supplies Assess patient/caregiver ability to perform ulcer/skin care regimen upon admission and as needed Assess ulceration(s) every visit Notes: Electronic Signature(s) Signed: 01/27/2018 8:00:35 AM By: Alric Quan Entered By: Alric Quan on 01/26/2018 14:47:17 [name] ([id number]) -------------------------------------------------------------------------------- Pain Assessment Details Patient Name: [name] Date of Service: 01/26/2018 2:45 PM Medical Record Number: [id number] Patient Account Number: [id number] Date of Birth/Sex: 06-12-1976 (42 y.o. Female) Treating RN: Montey Hora Primary Care Darica Goren: [name] Other Clinician: Referring Emma Schupp: [name] Treating Oluwatoni Rotunno/Extender: Cathie Olden in Treatment: 5 Active Problems Location of Pain Severity and Description of Pain Patient Has Paino Yes Site Locations Pain Location: Pain in Ulcers With Dressing Change: Yes Duration of the Pain. Constant / Intermittento Constant Pain Management and Medication Current Pain Management: Notes Topical or injectable lidocaine is offered to patient for acute pain when surgical debridement is performed. If  needed, Patient is instructed to use over the counter pain medication for the following 24-48  hours after debridement. Wound care MDs do not prescribed pain medications. Patient has chronic pain or uncontrolled pain. Patient has been instructed to make an appointment with their Primary Care Physician for pain management. Electronic Signature(s) Signed: 01/26/2018 5:33:45 PM By: Montey Hora Entered By: Montey Hora on 01/26/2018 14:27:19 [name] ([id number]) -------------------------------------------------------------------------------- Wound Assessment Details Patient Name: [name] Date of Service: 01/26/2018 2:45 PM Medical Record Number: [id number] Patient Account Number: [id number] Date of Birth/Sex: Jul 21, 1976 (42 y.o. Female) Treating RN: Montey Hora Primary Care Sophiea Ueda: [name] Other Clinician: Referring Darionna Banke: [name] Treating Philomena Buttermore/Extender: Cathie Olden in Treatment: 5 Wound Status Wound Number: 1 Primary Diabetic Wound/Ulcer of the Lower Extremity Etiology: Wound Location: Left Calcaneus Wound Open Wounding Event: Blister Status: Date Acquired: 10/03/2017 Comorbid Asthma, Hypertension, Type II Diabetes, Weeks Of Treatment: 5 History: Osteoarthritis, Neuropathy Clustered Wound: No Pending Amputation On Presentation Photos Photo Uploaded By: Montey Hora on 01/27/2018 11:12:12 Wound Measurements Length: (cm) 0.3 Width: (cm) 0.3 Depth: (cm) 0.1 Area: (cm) 0.071 Volume: (cm) 0.007 % Reduction in Area: 62.2% % Reduction in Volume: 92.6% Epithelialization: None Tunneling: No Undermining: No Wound Description Classification: Grade 2 Wound Margin: Flat and Intact Exudate Amount: Medium Exudate Type: Serous Exudate Color: amber Foul Odor After Cleansing: No Slough/Fibrino Yes Wound Bed Granulation Amount: Medium (34-66%) Exposed Structure Granulation Quality: Pink Fascia Exposed: No Necrotic Amount: Medium (34-66%) Fat Layer (Subcutaneous Tissue) Exposed: Yes Necrotic Quality: Eschar, Adherent  Slough Tendon Exposed: No Muscle Exposed: No Joint Exposed: No Bone Exposed: No Periwound Skin Texture [name], Miguelina ([id number]) Texture Color No Abnormalities Noted: No No Abnormalities Noted: No Callus: Yes Atrophie Blanche: No Crepitus: No Cyanosis: No Excoriation: No Ecchymosis: No Induration: No Erythema: Yes Rash: No Erythema Location: Circumferential Scarring: No Hemosiderin Staining: No Mottled: No Moisture Pallor: No No Abnormalities Noted: No Rubor: No Dry / Scaly: No Maceration: No Temperature / Pain Temperature: No Abnormality Tenderness on Palpation: Yes Wound Preparation Ulcer Cleansing: Rinsed/Irrigated with Saline Topical Anesthetic Applied: Other: lidocaine 4%, Electronic Signature(s) Signed: 01/26/2018 5:33:45 PM By: Montey Hora Entered By: Montey Hora on 01/26/2018 14:34:54 [name] ([id number]) -------------------------------------------------------------------------------- Wound Assessment Details Patient Name: [name] Date of Service: 01/26/2018 2:45 PM Medical Record Number: [id number] Patient Account Number: [id number] Date of Birth/Sex: 08/11/1976 (42 y.o. Female) Treating RN: Montey Hora Primary Care Rexford Prevo: [name] Other Clinician: Referring Babe Clenney: [name] Treating Trayvion Embleton/Extender: Cathie Olden in Treatment: 5 Wound Status Wound Number: 2 Primary Diabetic Wound/Ulcer of the Lower Extremity Etiology: Wound Location: Left Metatarsal head fifth - Lateral Wound Open Wounding Event: Gradually Appeared Status: Date Acquired: 01/02/2018 Comorbid Asthma, Hypertension, Type II Diabetes, Weeks Of Treatment: 2 History: Osteoarthritis, Neuropathy Clustered Wound: No Photos Photo Uploaded By: Montey Hora on 01/27/2018 11:13:05 Wound Measurements Length: (cm) 1 Width: (cm) 0.6 Depth: (cm) 0.2 Area: (cm) 0.471 Volume: (cm) 0.094 % Reduction in Area: 88% % Reduction in Volume:  88% Epithelialization: None Tunneling: No Undermining: No Wound Description Classification: Grade 2 Wound Margin: Flat and Intact Exudate Amount: Large Exudate Type: Serous Exudate Color: amber Foul Odor After Cleansing: Yes Due to Product Use: No Slough/Fibrino Yes Wound Bed Granulation Amount: None Present (0%) Exposed Structure Necrotic Amount: Large (67-100%) Fascia Exposed: No Necrotic Quality: Eschar, Adherent Slough Fat Layer (Subcutaneous Tissue) Exposed: No Tendon Exposed: No Muscle Exposed: No Joint Exposed: No Bone Exposed: No Periwound Skin Texture [name], Abbygayle ([id number]) Texture Color No  Abnormalities Noted: No No Abnormalities Noted: No Callus: No Atrophie Blanche: No Crepitus: No Cyanosis: No Excoriation: No Ecchymosis: No Induration: No Erythema: Yes Rash: No Erythema Location: Circumferential Scarring: No Hemosiderin Staining: No Mottled: No Moisture Pallor: No No Abnormalities Noted: No Rubor: No Dry / Scaly: No Maceration: Yes Temperature / Pain Temperature: No Abnormality Tenderness on Palpation: Yes Wound Preparation Ulcer Cleansing: Rinsed/Irrigated with Saline Topical Anesthetic Applied: Other: lidocaine 4%, Electronic Signature(s) Signed: 01/26/2018 5:33:45 PM By: Montey Hora Entered By: Montey Hora on 01/26/2018 14:35:09 [name] ([id number]) -------------------------------------------------------------------------------- Vitals Details Patient Name: [name] Date of Service: 01/26/2018 2:45 PM Medical Record Number: [id number] Patient Account Number: [id number] Date of Birth/Sex: Nov 25, 1976 (42 y.o. Female) Treating RN: Montey Hora Primary Care Stefannie Defeo: [name] Other Clinician: Referring Nyeshia Mysliwiec: [name] Treating Koal Eslinger/Extender: Cathie Olden in Treatment: 5 Vital Signs Time Taken: 14:27 Temperature (F): 98.1 Height (in): 61 Pulse (bpm): 94 Weight (lbs): 304 Respiratory  Rate (breaths/min): 20 Body Mass Index (BMI): 57.4 Blood Pressure (mmHg): 140/76 Reference Range: 80 - 120 mg / dl Electronic Signature(s) Signed: 01/26/2018 5:33:45 PM By: Montey Hora Entered By: Montey Hora on 01/26/2018 14:29:03

## 2018-01-28 NOTE — Progress Notes (Signed)
DOLLYE, GLASSER ([id number]) Visit Report for 01/26/2018 Chief Complaint Document Details Patient Name: [name], [name] Date of Service: 01/26/2018 2:45 PM Medical Record Number: [id number] Patient Account Number: [id number] Date of Birth/Sex: 1976-02-11 (42 y.o. Female) Treating RN: Carolyne Fiscal, Debi Primary Care Provider: [name] Other Clinician: Referring Provider: [name] Treating Provider/Extender: Cathie Olden in Treatment: 5 Information Obtained from: Patient Chief Complaint Left heel ulcer Electronic Signature(s) Signed: 01/26/2018 3:52:38 PM By: Lawanda Cousins Entered By: Lawanda Cousins on 01/26/2018 15:52:37 [name] ([id number]) -------------------------------------------------------------------------------- HPI Details Patient Name: [name] Date of Service: 01/26/2018 2:45 PM Medical Record Number: [id number] Patient Account Number: [id number] Date of Birth/Sex: [birth year] (42 y.o. Female) Treating RN: Carolyne Fiscal, Debi Primary Care Provider: [name] Other Clinician: Referring Provider: [name] Treating Provider/Extender: Cathie Olden in Treatment: 5 History of Present Illness Associated Signs and Symptoms: Patient has a history of diabetes mellitus type II, diabetic neuropathy, and hypertension. HPI Description: 12/16/17 patient presents today for evaluation concerning her left heel ulcer which has been present since June 2018. This occurred after she was outside knowing in her shoes with no socks. At this point in time she has been on Levaquin since November due to a significant infection which did wind up with her in the hospital. She is diabetic and unfortunately her diabetes is not well controlled with a hemoglobin A-1 C of 13.0 last evaluated on 10/29/17. Patient is also medication for high blood pressure. She did have an MRI of her left ankle with and without contrast which was performed on 10/29/17 and this showed skin  ulcerations on the field with mild appearing underlying cellulitis. At that point it was negative for abscess, osteomyelitis, or septic joint. There was also mild appearing plantar fasciitis noted. Patient notes that this is a very tender spot on her heel at this point. Fortunately she does not seem to be having any significant sign of infection at this point she is still on the Levaquin. She has previously been treated by her podiatrist. Her pain is rated to be a 6-7/10 worse at times with palpation or cleansing over the area. 01/11/18; the patient has not been here in almost a month. She presented with a left heel ulcer. She is a type II diabetic with neuropathy. MRI did not suggest osteomyelitis she's been packing the area with Endoform which is on the posterior left heel. She states that about 3 or 4 days ago she noted a change in skin color over the left fifth metatarsal head laterally she arrives today with erythema in this area with necrotic skin over the surface. 01/18/18; culture I did last week of the open area on the left fifth metatarsal head last week grew Serratia and group B strep. Doxycycline would not cover this and I started on Cipro on 2/11. She is still having some pain in the area but I think things are feeling better. She will definitely need an x-ray repeated. 01/26/18-she is here in follow-up evaluation for a left fifth metatarsal ulcer. She is complaining of extreme pain, unable to tolerate light touch. She has been on Cipro for 10 days with no improvement. The left foot is edematous with erythema extending circumferentially around the wound. She has been advised to go to the emergency department, she will go to [facility] ER when her husband is off work (after 5pm). The left heel ulcer is essentially healed. Iodoflex placed. I will contact [place] later, towards the time that she attempts being there. She states her  glucose levels are "okay", this morning >150 Electronic  Signature(s) Signed: 01/26/2018 3:56:25 PM By: Lawanda Cousins Entered By: Lawanda Cousins on 01/26/2018 15:56:25 [name] ([id number]) -------------------------------------------------------------------------------- Physician Orders Details Patient Name: [name] Date of Service: 01/26/2018 2:45 PM Medical Record Number: [id number] Patient Account Number: [id number] Date of Birth/Sex: 11/03/1976 (42 y.o. Female) Treating RN: Carolyne Fiscal, Debi Primary Care Provider: [name] Other Clinician: Referring Provider: [name] Treating Provider/Extender: Cathie Olden in Treatment: 5 Verbal / Phone Orders: Yes Clinician: Carolyne Fiscal, Debi Read Back and Verified: Yes Diagnosis Coding Wound Cleansing Wound #1 Left Calcaneus o Clean wound with Normal Saline. o Cleanse wound with mild soap and water o May Shower, gently pat wound dry prior to applying new dressing. Wound #2 Left,Lateral Metatarsal head fifth o Clean wound with Normal Saline. o Cleanse wound with mild soap and water o May Shower, gently pat wound dry prior to applying new dressing. Anesthetic (add to Medication List) Wound #1 Left Calcaneus o Topical Lidocaine 4% cream applied to wound bed prior to debridement (In Clinic Only). Wound #2 Left,Lateral Metatarsal head fifth o Topical Lidocaine 4% cream applied to wound bed prior to debridement (In Clinic Only). Primary Wound Dressing Wound #1 Left Calcaneus o Silvercel Non-Adherent Wound #2 Left,Lateral Metatarsal head fifth o Silvercel Non-Adherent Secondary Dressing Wound #1 Left Calcaneus o Dry Gauze o Boardered Foam Dressing - or telfa island dressing Wound #2 Left,Lateral Metatarsal head fifth o Dry Gauze o Boardered Foam Dressing - or telfa island dressing Dressing Change Frequency Wound #1 Left Calcaneus o Change dressing every other day. Wound #2 Left,Lateral Metatarsal head fifth o Change dressing every  other day. Follow-up Appointments Wound #1 Left Calcaneus [name], [name] ([id number]) o Return Appointment in 1 week. Wound #2 Left,Lateral Metatarsal head fifth o Return Appointment in 1 week. Edema Control Wound #1 Left Calcaneus o Elevate legs to the level of the heart and pump ankles as often as possible Wound #2 Left,Lateral Metatarsal head fifth o Elevate legs to the level of the heart and pump ankles as often as possible Additional Orders / Instructions Wound #1 Left Calcaneus o Vitamin A; Vitamin C, Zinc - Please take these over the counter supplements o Increase protein intake. Wound #2 Left,Lateral Metatarsal head fifth o Vitamin A; Vitamin C, Zinc - Please take these over the counter supplements o Increase protein intake. Patient Medications Allergies: Nubain Notifications Medication Indication Start End lidocaine DOSE 1 - topical 4 % cream - 1 cream topical Electronic Signature(s) Signed: 01/26/2018 5:45:09 PM By: Lawanda Cousins Entered By: Lawanda Cousins on 01/26/2018 15:56:58 [name], [name] ([id number]) -------------------------------------------------------------------------------- Prescription 01/26/2018 Patient Name: [name] Provider: Lawanda Cousins NP Date of Birth: 19-May-1976 NPI#: [id number] Sex: F DEA#: [id number] Phone #: [phone] License #: Patient Address: [place], [place] [address] [address], [place], [place] [address] [phone] Allergies Nubain Medication Medication: Route: Strength: Form: lidocaine 4 % topical cream topical 4% cream Class: TOPICAL LOCAL ANESTHETICS Dose: Frequency / Time: Indication: 1 1 cream topical Number of Refills: Number of Units: 0 Generic Substitution: Start Date: End Date: One Time Use: Substitution Permitted No Note to Pharmacy: Signature(s): Date(s): Electronic Signature(s) Signed:  01/26/2018 5:45:09 PM By: Lawanda Cousins Entered By: Lawanda Cousins on 01/26/2018 15:56:58 [name] ([id number]) --------------------------------------------------------------------------------  Problem List Details Patient Name: [name] Date of Service: 01/26/2018 2:45 PM Medical Record Number: [id number] Patient Account Number: [id number] Date of Birth/Sex: [birth year] (41 y.o.  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/16/2017 Yes exposed I10 Essential (primary) hypertension 12/16/2017 Yes E11.40 Type 2 diabetes mellitus with diabetic neuropathy, unspecified 12/16/2017 Yes L97.529 Non-pressure chronic ulcer of other part of left foot with unspecified 01/26/2018 Yes severity Inactive Problems Resolved Problems Electronic Signature(s) Signed: 01/26/2018 3:59:53 PM By: Lawanda Cousins Previous Signature: 01/26/2018 3:52:21 PM Version By: Lawanda Cousins Entered By: Lawanda Cousins on 01/26/2018 15:59:53 [name] ([id number]) -------------------------------------------------------------------------------- Progress Note Details Patient Name: [name] Date of Service: 01/26/2018 2:45 PM Medical Record Number: [id number] Patient Account Number: [id number] Date of Birth/Sex: 1976-01-01 (42 y.o. Female) Treating RN: Carolyne Fiscal, Debi Primary Care Provider: [name] Other Clinician: Referring Provider: [name] Treating Provider/Extender: Cathie Olden in Treatment: 5 Subjective Chief Complaint Information obtained from Patient Left heel ulcer History of Present Illness (HPI) The following HPI  elements were documented for the patient's wound: Associated Signs and Symptoms: Patient has a history of diabetes mellitus type II, diabetic neuropathy, and hypertension. 12/16/17 patient presents today for evaluation concerning her left heel ulcer which has been present since June 2018. This occurred after she was outside knowing in her shoes with no socks. At this point in time she has been on Levaquin since November due to a significant infection which did wind up with her in the hospital. She is diabetic and unfortunately her diabetes is not well controlled with a hemoglobin A-1 C of 13.0 last evaluated on 10/29/17. Patient is also medication for high blood pressure. She did have an MRI of her left ankle with and without contrast which was performed on 10/29/17 and this showed skin ulcerations on the field with mild appearing underlying cellulitis. At that point it was negative for abscess, osteomyelitis, or septic joint. There was also mild appearing plantar fasciitis noted. Patient notes that this is a very tender spot on her heel at this point. Fortunately she does not seem to be having any significant sign of infection at this point she is still on the Levaquin. She has previously been treated by her podiatrist. Her pain is rated to be a 6-7/10 worse at times with palpation or cleansing over the area. 01/11/18; the patient has not been here in almost a month. She presented with a left heel ulcer. She is a type II diabetic with neuropathy. MRI did not suggest osteomyelitis she's been packing the area with Endoform which is on the posterior left heel. She states that about 3 or 4 days ago she noted a change in skin color over the left fifth metatarsal head laterally she arrives today with erythema in this area with necrotic skin over the surface. 01/18/18; culture I did last week of the open area on the left fifth metatarsal head last week grew Serratia and group B strep. Doxycycline would not  cover this and I started on Cipro on 2/11. She is still having some pain in the area but I think things are feeling better. She will definitely need an x-ray repeated. 01/26/18-she is here in follow-up evaluation for a left fifth metatarsal ulcer. She is complaining of extreme pain, unable to tolerate light touch. She has been on Cipro for 10 days with no improvement. The left foot is edematous with  erythema extending circumferentially around the wound. She has been advised to go to the emergency department, she will go to [facility] ER when her husband is off work (after 5pm). The left heel ulcer is essentially healed. Iodoflex placed. I will contact [facility] later, towards the time that she attempts being there. She states her glucose levels are "okay", this morning >150 Patient History Information obtained from Patient. Family History Diabetes - Mother, Heart Disease - Father, Hypertension - Father, Lung Disease - Siblings, No family history of Cancer, Hereditary Spherocytosis, Kidney Disease, Seizures, Stroke, Thyroid Problems, Tuberculosis. Social History Former smoker - quit 18 years ago, Marital Status - Married, Alcohol Use - Never, Drug Use - No History, Caffeine Use - Moderate. [name], [name] ([id number]) Objective Constitutional Vitals Time Taken: 2:27 PM, Height: 61 in, Weight: 304 lbs, BMI: 57.4, Temperature: 98.1 F, Pulse: 94 bpm, Respiratory Rate: 20 breaths/min, Blood Pressure: 140/76 mmHg. Integumentary (Hair, Skin) Wound #1 status is Open. Original cause of wound was Blister. The wound is located on the Left Calcaneus. The wound measures 0.3cm length x 0.3cm width x 0.1cm depth; 0.071cm^2 area and 0.007cm^3 volume. There is Fat Layer (Subcutaneous Tissue) Exposed exposed. There is no tunneling or undermining noted. There is a medium amount of serous drainage noted. The wound margin is flat and intact. There is medium (34-66%) pink granulation within the wound bed. There is a medium  (34-66%) amount of necrotic tissue within the wound bed including Eschar and Adherent Slough. The periwound skin appearance exhibited: Callus, Erythema. The periwound skin appearance did not exhibit: Crepitus, Excoriation, Induration, Rash, Scarring, Dry/Scaly, Maceration, Atrophie Blanche, Cyanosis, Ecchymosis, Hemosiderin Staining, Mottled, Pallor, Rubor. The surrounding wound skin color is noted with erythema which is circumferential. Periwound temperature was noted as No Abnormality. The periwound has tenderness on palpation. Wound #2 status is Open. Original cause of wound was Gradually Appeared. The wound is located on the Left,Lateral Metatarsal head fifth. The wound measures 1cm length x 0.6cm width x 0.2cm depth; 0.471cm^2 area and 0.094cm^3 volume. There is no tunneling or undermining noted. There is a large amount of serous drainage noted. Foul odor after cleansing was noted. The wound margin is flat and intact. There is no granulation within the wound bed. There is a large (67- 100%) amount of necrotic tissue within the wound bed including Eschar and Adherent Slough. The periwound skin appearance exhibited: Maceration, Erythema. The periwound skin appearance did not exhibit: Callus, Crepitus, Excoriation, Induration, Rash, Scarring, Dry/Scaly, Atrophie Blanche, Cyanosis, Ecchymosis, Hemosiderin Staining, Mottled, Pallor, Rubor. The surrounding wound skin color is noted with erythema which is circumferential. Periwound temperature was noted as No Abnormality. The periwound has tenderness on palpation. 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 [name], [name] ([id number]) Cleanse wound with mild soap and water May Shower, gently pat wound dry prior to applying new dressing. Wound #2 Left,Lateral Metatarsal head fifth: Clean wound with Normal Saline. Cleanse wound with mild soap and water May Shower, gently pat wound dry prior to applying new dressing. Anesthetic (add to Medication List): Wound #1 Left Calcaneus: Topical Lidocaine 4% cream applied to wound bed prior to debridement (In Clinic  Only). Wound #2 Left,Lateral Metatarsal head fifth: Topical Lidocaine 4% cream applied to wound bed prior to debridement (In Clinic Only). Primary Wound Dressing: Wound #1 Left Calcaneus: Silvercel Non-Adherent Wound #2 Left,Lateral Metatarsal head fifth: Silvercel Non-Adherent Secondary Dressing: Wound #1 Left Calcaneus: Dry Gauze Boardered Foam Dressing - or telfa island dressing Wound #2 Left,Lateral Metatarsal head fifth: Dry Gauze Boardered Foam Dressing - or telfa island dressing Dressing Change Frequency: Wound #1 Left Calcaneus: Change dressing every other day. Wound #2 Left,Lateral Metatarsal head fifth: Change dressing every other day. Follow-up Appointments: Wound #1 Left Calcaneus: Return Appointment in 1 week. Wound #2 Left,Lateral Metatarsal head fifth: Return Appointment in 1 week. Edema Control: Wound #1 Left Calcaneus: Elevate legs to the level of the heart and pump ankles as often as possible Wound #2 Left,Lateral Metatarsal head fifth: Elevate legs to the level of the heart and pump ankles as often as possible Additional Orders / Instructions: Wound #1 Left Calcaneus: Vitamin A; Vitamin C, Zinc - Please take these over the counter supplements Increase protein intake. Wound #2 Left,Lateral Metatarsal head fifth: Vitamin A; Vitamin C, Zinc - Please take these over the counter supplements Increase protein intake. The following medication(s)  was prescribed: lidocaine topical 4 % cream 1 1 cream topical was prescribed at facility 1. Iodoflex today 2. to [facility] ER for evaluation and anticipated admission for IV antibiotics and radiogrphic imaging [name], [name] ([id number]) Electronic Signature(s) Signed: 01/26/2018 4:00:11 PM By: Lawanda Cousins Previous Signature: 01/26/2018 3:57:36 PM Version By: Lawanda Cousins Entered By: Lawanda Cousins on 01/26/2018 16:00:11 [name] ([id number]) -------------------------------------------------------------------------------- ROS/PFSH Details Patient Name: [name] Date of Service: 01/26/2018 2:45 PM Medical Record Number: [id number] Patient Account Number: [id number] Date of Birth/Sex: [birth year] (42 y.o. Female) Treating RN: Carolyne Fiscal, Debi Primary Care Provider: [name] Other Clinician: Referring Provider: [name] Treating Provider/Extender: Cathie Olden in Treatment: 5 Information Obtained From Patient Wound History Do you currently have one or more open woundso Yes How many open wounds do you currently haveo 1 Approximately how long have you had your woundso 2 months How have you been treating your wound(s) until nowo bandaids Has your wound(s) ever healed and then re-openedo No Have you had any lab work done in the past montho No Have you tested positive for an antibiotic resistant organism (MRSA, VRE)o No Have you tested positive for osteomyelitis (bone infection)o No Have you had any tests for circulation on your legso No Hematologic/Lymphatic Medical History: Negative for: Anemia; Hemophilia; Human Immunodeficiency Virus; Lymphedema; Sickle Cell Disease Respiratory Medical History: Positive for: Asthma Negative for: Chronic Obstructive Pulmonary Disease (COPD) Cardiovascular Medical History: Positive for: Hypertension Negative for: Angina; Arrhythmia; Congestive Heart Failure; Coronary Artery Disease; Deep Vein Thrombosis;  Hypotension; Myocardial Infarction; Peripheral Arterial Disease; Peripheral Venous Disease; Phlebitis; Vasculitis Gastrointestinal Medical History: Negative for: Cirrhosis ; Colitis; Crohnos; Hepatitis A; Hepatitis B; Hepatitis C Endocrine Medical History: Positive for: Type II Diabetes Treated with: Insulin Blood sugar tested every day: Yes Tested : 3-4 times daily Genitourinary Medical History: Negative for: End Stage Renal Disease [name], [name] ([id number]) Immunological Medical History: Negative for: Lupus Erythematosus; Raynaudos; Scleroderma Musculoskeletal Medical History: Positive for: Osteoarthritis Negative for: Gout; Rheumatoid Arthritis; Osteomyelitis Neurologic Medical History: Positive for: Neuropathy Oncologic Medical History: Negative for: Received Chemotherapy; Received Radiation Immunizations Pneumococcal Vaccine: Received Pneumococcal Vaccination: No Implantable Devices Family and Social History Cancer: No; Diabetes: Yes - Mother; Heart Disease: Yes - Father; Hereditary Spherocytosis: No; Hypertension: Yes - Father; Kidney Disease: No; Lung Disease: Yes -  Siblings; Seizures: No; Stroke: No; Thyroid Problems: No; Tuberculosis: No; Former smoker - quit 18 years ago; Marital Status - Married; Alcohol Use: Never; Drug Use: No History; Caffeine Use: Moderate; Financial Concerns: No; Food, Clothing or Shelter Needs: No; Support System Lacking: No; Transportation Concerns: No; Advanced Directives: No; Patient does not want information on Advanced Directives Physician Affirmation I have reviewed and agree with the above information. Electronic Signature(s) Signed: 01/26/2018 5:45:09 PM By: Lawanda Cousins Signed: 01/27/2018 8:00:35 AM By: Alric Quan Entered By: Lawanda Cousins on 01/26/2018 15:56:34 [name] ([id number]) -------------------------------------------------------------------------------- SuperBill Details Patient Name: [name] Date of  Service: 01/26/2018 Medical Record Number: [id number] Patient Account Number: [id number] Date of Birth/Sex: [birth year] (42 y.o. Female) Treating RN: Carolyne Fiscal, Debi Primary Care Provider: [name] Other Clinician: Referring Provider: [name] Treating Provider/Extender: Cathie Olden in Treatment: 5 Diagnosis Coding ICD-10 Codes Code Description E11.621 Type 2 diabetes mellitus with foot ulcer 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 layer exposed I10 Essential (primary) hypertension E11.40 Type 2 diabetes mellitus with diabetic neuropathy, unspecified L97.529 Non-pressure chronic ulcer of other part of left foot with unspecified severity Facility Procedures CPT4 Code: [id number] Description: 99213 - WOUND CARE VISIT-LEV 3 EST PT Modifier: Quantity: 1 Physician Procedures CPT4 Code Description: [id number] 99213 - WC PHYS LEVEL 3 - EST PT ICD-10 Diagnosis Description 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 laye L97.529 Non-pressure chronic ulcer of other part of left foot with unspec E11.40 Type 2  diabetes mellitus with diabetic neuropathy, unspecified E11.621 Type 2 diabetes mellitus with foot ulcer Modifier: r exposed ified severity Quantity: 1 Electronic Signature(s) Signed: 01/26/2018 5:28:10 PM By: Alric Quan Signed: 01/26/2018 5:45:09 PM By: Lawanda Cousins Previous Signature: 01/26/2018 3:59:31 PM Version By: Lawanda Cousins Entered By: Alric Quan on 01/26/2018 17:28:10

## 2018-01-29 DIAGNOSIS — E1159 Type 2 diabetes mellitus with other circulatory complications: Secondary | ICD-10-CM

## 2018-01-29 LAB — CBC
HEMATOCRIT: 30 % — AB (ref 36.0–46.0)
HEMOGLOBIN: 9.7 g/dL — AB (ref 12.0–15.0)
MCH: 29.8 pg (ref 26.0–34.0)
MCHC: 32.3 g/dL (ref 30.0–36.0)
MCV: 92.3 fL (ref 78.0–100.0)
Platelets: 289 10*3/uL (ref 150–400)
RBC: 3.25 MIL/uL — ABNORMAL LOW (ref 3.87–5.11)
RDW: 13.2 % (ref 11.5–15.5)
WBC: 14.5 10*3/uL — ABNORMAL HIGH (ref 4.0–10.5)

## 2018-01-29 LAB — BASIC METABOLIC PANEL
Anion gap: 11 (ref 5–15)
BUN: 31 mg/dL — AB (ref 6–20)
CHLORIDE: 96 mmol/L — AB (ref 101–111)
CO2: 24 mmol/L (ref 22–32)
CREATININE: 1.83 mg/dL — AB (ref 0.44–1.00)
Calcium: 8.1 mg/dL — ABNORMAL LOW (ref 8.9–10.3)
GFR calc Af Amer: 39 mL/min — ABNORMAL LOW (ref >60)
GFR calc non Af Amer: 34 mL/min — ABNORMAL LOW (ref >60)
GLUCOSE: 299 mg/dL — AB (ref 65–99)
POTASSIUM: 4 mmol/L (ref 3.5–5.1)
SODIUM: 131 mmol/L — AB (ref 135–145)

## 2018-01-29 LAB — GLUCOSE, CAPILLARY
GLUCOSE-CAPILLARY: 406 mg/dL — AB (ref 65–99)
Glucose-Capillary: 300 mg/dL — ABNORMAL HIGH (ref 65–99)
Glucose-Capillary: 278 mg/dL — ABNORMAL HIGH (ref 65–99)
Glucose-Capillary: 482 mg/dL — ABNORMAL HIGH (ref 65–99)
Glucose-Capillary: 352 mg/dL — ABNORMAL HIGH (ref 65–99)
Glucose-Capillary: 325 mg/dL — ABNORMAL HIGH (ref 65–99)

## 2018-01-29 LAB — VANCOMYCIN, TROUGH: VANCOMYCIN TR: 27 ug/mL — AB (ref 15–20)

## 2018-01-29 MED ORDER — INSULIN ASPART 100 UNIT/ML ~~LOC~~ SOLN: 0.0000 [IU] | Freq: Three times a day (TID) | SUBCUTANEOUS | Status: DC

## 2018-01-29 MED ORDER — PROMETHAZINE HCL 25 MG PO TABS
25.0000 mg | ORAL_TABLET | Freq: Three times a day (TID) | ORAL | Status: DC | PRN
  Administered 2018-01-29 – 2018-02-01 (×4): 25 mg via ORAL
  Filled 2018-01-29 (×4): qty 1

## 2018-01-29 MED ORDER — INSULIN ASPART 100 UNIT/ML ~~LOC~~ SOLN
0.0000 [IU] | Freq: Every day | SUBCUTANEOUS | Status: DC
  Administered 2018-01-29: 4 [IU] via SUBCUTANEOUS

## 2018-01-29 MED ORDER — SODIUM CHLORIDE 0.45 % IV SOLN
INTRAVENOUS | Status: DC
  Administered 2018-01-29 – 2018-01-31 (×6): via INTRAVENOUS

## 2018-01-29 MED ORDER — SODIUM CHLORIDE 0.9 % IV BOLUS (SEPSIS)
1000.0000 mL | Freq: Once | INTRAVENOUS | Status: AC
  Administered 2018-01-29: 1000 mL via INTRAVENOUS

## 2018-01-29 MED ORDER — CARVEDILOL 3.125 MG PO TABS
3.1250 mg | ORAL_TABLET | Freq: Two times a day (BID) | ORAL | Status: DC
  Administered 2018-01-29 – 2018-02-03 (×5): 3.125 mg via ORAL
  Filled 2018-01-29 (×9): qty 1

## 2018-01-29 MED ORDER — INSULIN ASPART 100 UNIT/ML ~~LOC~~ SOLN
5.0000 [IU] | Freq: Once | SUBCUTANEOUS | Status: AC
  Administered 2018-01-29: 5 [IU] via SUBCUTANEOUS

## 2018-01-29 MED ORDER — MORPHINE SULFATE (PF) 4 MG/ML IV SOLN
2.0000 mg | INTRAVENOUS | Status: DC | PRN
  Administered 2018-01-29 – 2018-02-02 (×14): 2 mg via INTRAVENOUS
  Filled 2018-01-29 (×16): qty 1

## 2018-01-29 NOTE — Progress Notes (Signed)
Updated MD Grandville Silos on patient blood sugar she said give another 5 units and recheck in an hour. Will continue to monitor.

## 2018-01-29 NOTE — Progress Notes (Addendum)
Pharmacy Antibiotic Note  [name] is a 42 y.o. female admitted on 01/27/2018 with left foot cellulitis and osteomyelitis.   Outpatient culture grew Serratia and GBS and patient was started on antibiotics and did not improve.  Pharmacy has been consulted to dose vancomycin and Zosyn.  Patient has AKI and vancomycin trough is supra-therapeutic on a dose that would be considered low for age and size.  Patient received 12 noon dose before the level resulted.  Tmax 100.2, WBC up 14.5.   Plan: Change vanc to 1gm IV Q12H, next dose tomorrow AM after assessing AM labs Continue Zosyn EID 3.375gm IV Q8H Monitor renal fxn, clinical progress, repeat vanc trough at [facility] in AM.  If SCr continues to trend up, will need to dose per level. Consider changing 1/2NS to NS given hyponatremia   Height: 5\' 2"  (157.5 cm) Weight: (!) 322 lb 8.5 oz (146.3 kg) IBW/kg (Calculated) : 50.1  Temp (24hrs), Avg:99.2 F (37.3 C), Min:98.1 F (36.7 C), Max:100.2 F (37.9 C)  Recent Labs  Lab 01/26/18 1724 01/27/18 0429 01/28/18 0601 01/29/18 0536 01/29/18 0922 01/29/18 1203  WBC 11.2* 11.7* 12.8* 14.5*  --   --   CREATININE 0.55 0.48 0.76  --  1.83*  --   VANCOTROUGH  --   --   --   --   --  27*    Estimated Creatinine Clearance: 57.7 mL/min (A) (by C-G formula based on SCr of 1.83 mg/dL (H)).    Allergies  Allergen Reactions  . Kiwi Extract Shortness Of Breath and Swelling  . Nubain [Nalbuphine Hcl] Other (See Comments)    "FEELS LIKE SOMETHING CRAWLING ON ME"    Vanc 2/22 >> Zosyn 2/22 >> Doxy PTA 2/6 >> 2/22 Cipro PTA 2/11 >> 2/22  2/24 VT = 27 mcg/mL on 1g q8 >> 1g q12 (SCr 1.83)  2/22 BCx -   Outpt cultures: Serratia and GBS (sensitive CTX, Cipro, Septra)   [name] D. Mina Marble, PharmD, BCPS Pager:  [phone] 01/29/2018, 1:59 PM

## 2018-01-29 NOTE — Progress Notes (Signed)
Patient's blood sugar 482, per protocol called MD, spoke with MD Grandville Silos she said to give her the 20 units and recheck in an hour and call back with result. Will continue to monitor.

## 2018-01-29 NOTE — Progress Notes (Addendum)
Family Medicine Teaching Service Daily Progress Note Intern Pager: [phone]  Patient name: [name] Medical record number: [id number] Date of birth: 03-04-1976 Age: 42 y.o. Gender: female  Primary Care Provider: [name], MD Consultants: Orthopedic surgery  Code Status: Full   Pt Overview and Major Events to Date:  Admitted to [facility] on 01/27/2018  Assessment and Plan: [name] is a 42 y.o. female presenting with L foot ulcer. PMH is significant for uncontrolled T2DM, COPD, HLD, HTN, OSA, bipolar disorder.  L lateral base 5th MTP foot diabetic foot ulcer with osteomyelitis -  Worsening surrounding celluitis this AM. Failed outpt doxycyline and ciprofloxacin.  MR foot concerning for osteomyelitis, ortho recommends amputation however patient prefers trial of IV abx.  Initially had concerns for sepsis this AM with hypotension 94/55 (MAP 68) on multiple checks, afebrile but with worsening leukocytosis at 14.5 from 12.8. Hypotension improved with fluids. May have been due to narcotic pain control.  - continue day # 3 of IV Vanc/zosyn (2/22-) - 1L bolus and start maintenance fluids, hold HTN meds this AM - BCx NGx1D - continue home norco 3-325mg  q8prn - morphine >> initially held for hypotension which improved to 115/94 with fluids >> added back morphine 2 mg q4H PRN with hold parameters for hypotension  Type 2 DM with diabetic neuropathy - Poorly controlled in setting of refused lantus dose x1 2/23 PM.  CBGs consistently elevated, 300s overnight. A1C of 13.8 on 01/27/2017. Home meds: Tresiba 106U daily with novolog 15U at lunchtime and 25U with supper. - rSSI, added nighttime coverage - lantus 52U bid >>> patient refused lantus dose yesterday evening, discussed with her today and she now agrees to BID dosing. May need to increase dose if CBG remain elevated, but keep at 52u BID for now - monitor CBGs - continue gabapentin 1200mg  tid  - will check BMP due to elevated sugars  overnight, make sure there is no gap/HHS  Nausea with vomiting x1, new - may be in setting of acute infection vs. Elevated sugars overnight. We can try phenergan PRN for nausea - correct sugar, continue to treat infection - monitor nausea - monitor electrolytes with vomiting  HTN Currently hypotensive. Home meds: coreg 3.125mg  BID, lisinopril 5mg  qd - fluids as above  - hold home lisinopril for hypotension - hold home coreg, consider restarting later today if BPs improve  COPD Stable. On symbicort 2 puffs BID with prn albuterol - dulera while admitted - prn albuterol  OA Chronic, stable. Chronic norco 10-325mg  q8 for pain relief with flexeril prn.  - continue home norco  HLD Home meds: atorvastatin 20mg  daily  - continue home atorvastatin  FEN/GI: heart healthy carb modified diet, protonix, mIVF Prophylaxis: lovenox  Disposition: continued inpatient stay for IV abx   Subjective:  Patient noted to be hypotensive this AM, additionally endorsing nausea but no light headedness or dizziness. She continues to complain of foot pain. She reports confusion over why she is getting lantus in the hospital when she takes tresiba at home, which was addressed and she voiced understanding.  Objective: Temp:  [98.1 F (36.7 C)-100.2 F (37.9 C)] 98.1 F (36.7 C) (02/24 0903) Pulse Rate:  [78-96] 78 (02/24 0903) Resp:  [18-20] 20 (02/24 0903) BP: (86-115)/(39-98) 115/98 (02/24 1052) SpO2:  [93 %-97 %] 97 % (02/24 0903) Physical Exam: General: alert, sitting on side of bed, appropriate, NAD Cardiovascular: RRR, no m/r/g Respiratory: CTA bil, no W/R/R Abdomen: soft, nt, nd Extremities: tenderness to palpation of left  foot Skin: patient feels +erythema and edema compared to marked borders, do not see erythema extending past borders. +L lateral foot ulcer, No active pustular drainage.   Laboratory: Recent Labs  Lab 01/27/18 0429 01/28/18 0601 01/29/18 0536  WBC 11.7* 12.8* 14.5*   HGB 12.1 11.3* 9.7*  HCT 35.6* 35.0* 30.0*  PLT 306 301 289   Recent Labs  Lab 01/26/18 1724 01/27/18 0429 01/28/18 0601  NA 137 135 134*  K 4.0 4.0 4.1  CL 101 100* 98*  CO2 25 22 20*  BUN 10 10 17   CREATININE 0.55 0.48 0.76  CALCIUM 8.9 9.1 8.5*  GLUCOSE 372* 393* 374*   CBG (last 3)  Recent Labs    01/28/18 1625 01/28/18 2120 01/29/18 0748  GLUCAP 331* 316* 300*     Results for orders placed or performed during the hospital encounter of 01/27/18  Blood culture (routine x 2)     Status: None (Preliminary result)   Collection Time: 01/27/18  5:03 AM  Result Value Ref Range Status   Specimen Description BLOOD RIGHT ANTECUBITAL  Final   Special Requests   Final    BOTTLES DRAWN AEROBIC AND ANAEROBIC Blood Culture adequate volume   Culture   Final    NO GROWTH 1 DAY Performed at [facility], [place] [address], [place], [place] [address]    Report Status PENDING  Incomplete  Blood culture (routine x 2)     Status: None (Preliminary result)   Collection Time: 01/27/18  5:12 AM  Result Value Ref Range Status   Specimen Description BLOOD RIGHT FOREARM  Final   Special Requests   Final    BOTTLES DRAWN AEROBIC AND ANAEROBIC Blood Culture adequate volume   Culture   Final    NO GROWTH 1 DAY Performed at [facility], [place] [address], [place],  [address]    Report Status PENDING  Incomplete    Imaging/Diagnostic Tests: Mr Foot Left Wo Contrast 01/27/2018  IMPRESSION: Soft tissue ulcer along the lateral aspect of the fifth metatarsal head. Mild cortical irregularity and bone marrow edema in the fifth metatarsal head concerning for osteomyelitis.   Dg Foot Complete Left 01/26/2018  IMPRESSION: No evidence of osteomyelitis. No opaque foreign body or soft tissue gas.   Everrett Coombe, MD 01/29/2018, 11:16 AM PGY-2, [place] Intern pager: [phone], text pages welcome

## 2018-01-30 ENCOUNTER — Encounter (HOSPITAL_COMMUNITY): Payer: Self-pay

## 2018-01-30 ENCOUNTER — Other Ambulatory Visit: Payer: Self-pay

## 2018-01-30 DIAGNOSIS — Z91018 Allergy to other foods: Secondary | ICD-10-CM

## 2018-01-30 DIAGNOSIS — Z8249 Family history of ischemic heart disease and other diseases of the circulatory system: Secondary | ICD-10-CM

## 2018-01-30 DIAGNOSIS — E118 Type 2 diabetes mellitus with unspecified complications: Secondary | ICD-10-CM

## 2018-01-30 DIAGNOSIS — N141 Nephropathy induced by other drugs, medicaments and biological substances: Secondary | ICD-10-CM

## 2018-01-30 DIAGNOSIS — Z87891 Personal history of nicotine dependence: Secondary | ICD-10-CM

## 2018-01-30 DIAGNOSIS — B951 Streptococcus, group B, as the cause of diseases classified elsewhere: Secondary | ICD-10-CM

## 2018-01-30 DIAGNOSIS — E1169 Type 2 diabetes mellitus with other specified complication: Principal | ICD-10-CM

## 2018-01-30 DIAGNOSIS — B9689 Other specified bacterial agents as the cause of diseases classified elsewhere: Secondary | ICD-10-CM

## 2018-01-30 DIAGNOSIS — T368X5A Adverse effect of other systemic antibiotics, initial encounter: Secondary | ICD-10-CM

## 2018-01-30 DIAGNOSIS — Z833 Family history of diabetes mellitus: Secondary | ICD-10-CM

## 2018-01-30 DIAGNOSIS — Z885 Allergy status to narcotic agent status: Secondary | ICD-10-CM

## 2018-01-30 DIAGNOSIS — L97529 Non-pressure chronic ulcer of other part of left foot with unspecified severity: Secondary | ICD-10-CM

## 2018-01-30 LAB — BASIC METABOLIC PANEL
Anion gap: 11 (ref 5–15)
BUN: 35 mg/dL — AB (ref 6–20)
CHLORIDE: 97 mmol/L — AB (ref 101–111)
CO2: 22 mmol/L (ref 22–32)
CREATININE: 1.9 mg/dL — AB (ref 0.44–1.00)
Calcium: 7.6 mg/dL — ABNORMAL LOW (ref 8.9–10.3)
GFR calc Af Amer: 37 mL/min — ABNORMAL LOW (ref >60)
GFR calc non Af Amer: 32 mL/min — ABNORMAL LOW (ref >60)
GLUCOSE: 168 mg/dL — AB (ref 65–99)
Potassium: 3.8 mmol/L (ref 3.5–5.1)
SODIUM: 130 mmol/L — AB (ref 135–145)

## 2018-01-30 LAB — CBC
HCT: 28.9 % — ABNORMAL LOW (ref 36.0–46.0)
HEMOGLOBIN: 9.4 g/dL — AB (ref 12.0–15.0)
MCH: 29.6 pg (ref 26.0–34.0)
MCHC: 32.5 g/dL (ref 30.0–36.0)
MCV: 90.9 fL (ref 78.0–100.0)
PLATELETS: 293 10*3/uL (ref 150–400)
RBC: 3.18 MIL/uL — ABNORMAL LOW (ref 3.87–5.11)
RDW: 12.8 % (ref 11.5–15.5)
WBC: 15.2 10*3/uL — ABNORMAL HIGH (ref 4.0–10.5)

## 2018-01-30 LAB — GLUCOSE, CAPILLARY
GLUCOSE-CAPILLARY: 194 mg/dL — AB (ref 65–99)
Glucose-Capillary: 161 mg/dL — ABNORMAL HIGH (ref 65–99)
Glucose-Capillary: 220 mg/dL — ABNORMAL HIGH (ref 65–99)
Glucose-Capillary: 261 mg/dL — ABNORMAL HIGH (ref 65–99)

## 2018-01-30 MED ORDER — INSULIN GLARGINE 100 UNIT/ML ~~LOC~~ SOLN
60.0000 [IU] | Freq: Two times a day (BID) | SUBCUTANEOUS | Status: DC
  Administered 2018-01-30 – 2018-01-31 (×4): 60 [IU] via SUBCUTANEOUS
  Filled 2018-01-30 (×5): qty 0.6

## 2018-01-30 MED ORDER — CHLORHEXIDINE GLUCONATE 4 % EX LIQD: 60.0000 mL | Freq: Once | CUTANEOUS | Status: DC

## 2018-01-30 MED ORDER — OXYCODONE HCL 5 MG PO TABS
5.0000 mg | ORAL_TABLET | Freq: Once | ORAL | Status: AC
  Administered 2018-02-01: 5 mg via ORAL
  Filled 2018-01-30 (×4): qty 1

## 2018-01-30 MED ORDER — INSULIN ASPART 100 UNIT/ML ~~LOC~~ SOLN
0.0000 [IU] | Freq: Three times a day (TID) | SUBCUTANEOUS | Status: DC
  Administered 2018-01-30: 4 [IU] via SUBCUTANEOUS
  Administered 2018-01-31 (×3): 7 [IU] via SUBCUTANEOUS
  Administered 2018-02-01: 11 [IU] via SUBCUTANEOUS
  Administered 2018-02-02: 7 [IU] via SUBCUTANEOUS
  Administered 2018-02-02: 4 [IU] via SUBCUTANEOUS
  Administered 2018-02-02: 7 [IU] via SUBCUTANEOUS
  Administered 2018-02-03: 3 [IU] via SUBCUTANEOUS

## 2018-01-30 MED ORDER — INSULIN ASPART 100 UNIT/ML ~~LOC~~ SOLN
0.0000 [IU] | Freq: Every day | SUBCUTANEOUS | Status: DC
  Administered 2018-01-30: 2 [IU] via SUBCUTANEOUS
  Administered 2018-02-01: 4 [IU] via SUBCUTANEOUS
  Administered 2018-02-02: 2 [IU] via SUBCUTANEOUS

## 2018-01-30 MED ORDER — INSULIN ASPART 100 UNIT/ML ~~LOC~~ SOLN: 20.0000 [IU] | Freq: Three times a day (TID) | SUBCUTANEOUS | Status: DC

## 2018-01-30 MED ORDER — POVIDONE-IODINE 10 % EX SWAB
2.0000 "application " | Freq: Once | CUTANEOUS | Status: AC
  Administered 2018-02-01: 2 via TOPICAL

## 2018-01-30 MED ORDER — INSULIN ASPART 100 UNIT/ML ~~LOC~~ SOLN: 0.0000 [IU] | Freq: Every day | SUBCUTANEOUS | Status: DC

## 2018-01-30 MED ORDER — LINEZOLID 600 MG/300ML IV SOLN
600.0000 mg | Freq: Two times a day (BID) | INTRAVENOUS | Status: DC
  Filled 2018-01-30: qty 300

## 2018-01-30 MED ORDER — INSULIN ASPART 100 UNIT/ML ~~LOC~~ SOLN
0.0000 [IU] | Freq: Three times a day (TID) | SUBCUTANEOUS | Status: DC
  Administered 2018-01-30: 7 [IU] via SUBCUTANEOUS

## 2018-01-30 MED ORDER — INSULIN ASPART 100 UNIT/ML ~~LOC~~ SOLN
5.0000 [IU] | Freq: Three times a day (TID) | SUBCUTANEOUS | Status: DC
  Administered 2018-01-30 – 2018-02-02 (×5): 5 [IU] via SUBCUTANEOUS

## 2018-01-30 NOTE — Progress Notes (Signed)
Paged MD of moderate pain at this time, Morphine IV is held for low BP 98/70. OXY IR once ordered. Will monitor pt.

## 2018-01-30 NOTE — Progress Notes (Signed)
Pt's temp quite high at this time. Per pt, she does not feel feverish. Refused Norco at this time. Turned the Room [facility] very low, uncovered pt. Fan used to cool the room. Will monitor pt.

## 2018-01-30 NOTE — Progress Notes (Signed)
[facility] Infusion Coordinator will follow pt with ID team to support home IV ABX at home if ordered.  If patient discharges after hours, please call [phone].   Larry Sierras 01/30/2018, 3:04 PM

## 2018-01-30 NOTE — Consult Note (Signed)
[facility] for Infectious Disease    Date of Admission:  01/27/2018     Total days of antibiotics 5  Vancmycin  Zosyn                Reason for Consult: Diabetic foot infection with osteomyelitis     Referring Provider: Chambliss    Assessment: 42 y.o. F with concerns for subacute osteomyelitis of her left #5 ray / metatarsal head. She previously had wound on her left heel that has caused her to change her walking pattern/weight bearing and now over the last 2 months has developed a new ulcer to the lateral aspect of her 5th toe of which is open. She has received 5 days of vancomycin + zosyn for this and unfortunately developed AKI on this combination. Her vancomycin is now on hold with supra-therapeutic levels and plan to transition to linezolid in the next 48h. She initially was evaluated by Dr. Sharol Given who recommended consideration for amputation considering the chronicity of the wound with several hospitalizations. Previously outpatient wound cultures were positive for Serratia (R-cefazolin) and group b strep.   There was concern that her infection was worsening due to extension of redness, however I wonder if this was more due to toxin effect from treatment of surrounding cellulitis and not antibiotic failure - either way it seems to be receeding from previous marks. Her foot is well perfused and warm presently with ongoing erythema and swelling contained mostly to the mid- and forefoot.   Plan: 1. Would ask for deep surgical culture of tissues as I am uncertain if this was a superficial swab yielding the serratia/gbs. Ceftriaxone would be adequate to cover for both. For now would continue zosyn until definative decision regarding surgery as she is now having fevers and WBCs are increaseing several days into antibiotic treatment.  2. Vanco on hold with supratherapeutic levels - plan to transition to linezolid after a few days; she has biopolar disorder but not currently on any  SSRI's.  3. She is in agreeable to proceed with amputation based on our conversation today.  4. She will need to work on behavior modification and better glycemic control after surgery to help with post-op wound healing (HgbA1C 13.8).  5. Continue monitoring creatinine for improvement of creatinine off vancomycin. Limit other nephrotoxic agents.   Active Problems:   Diabetes mellitus ([place])   Cellulitis of left foot   Subacute osteomyelitis, left ankle and foot ([place])   . atorvastatin  20 mg Oral q1800  . carvedilol  3.125 mg Oral BID WC  . enoxaparin (LOVENOX) injection  40 mg Subcutaneous Q24H  . gabapentin  1,200 mg Oral TID  . insulin aspart  0-20 Units Subcutaneous TID WC  . insulin aspart  0-5 Units Subcutaneous QHS  . insulin glargine  60 Units Subcutaneous BID  . mometasone-formoterol  2 puff Inhalation BID  . pantoprazole  40 mg Oral Daily    HPI: [name] is a 42 y.o. female with uncontrolled diabetes and ulcer to her left heel. She has been working with Dr. Dellia Nims at the [facility] since January 2019 - wound has been present since June 2018 and occurred after she was wearing shoes without socks outside. She was given Levaquin from November to January after being hospitalized for significant acute on chronic infection of this foot.   Open culture of the left 5th metatarsal grew serratia and group b strep. She was transitioned from  doxy to ciprofloxacin to cover the serratia and was last seen by Dr. Dellia Nims 2/13. On follow up visit she was referred to the ED   She has bipolar disorder previously followed at [facility] - was going to arrange for outpatient management through them again prior to admission.    MRI Left Foot 01/27/18 >  Soft tissue ulcer along the lateral aspect of the fifth metatarsal head. Mild cortical irregularity and bone marrow edema in the fifth metatarsal head concerning for osteomyelitis.  Review of Systems: Review of Systems    Constitutional: Positive for fever and malaise/fatigue. Negative for chills.  HENT: Negative for tinnitus.   Eyes: Negative for blurred vision and photophobia.  Respiratory: Negative for cough and sputum production.   Cardiovascular: Positive for leg swelling (LLE). Negative for chest pain.  Gastrointestinal: Negative for diarrhea, nausea and vomiting.  Genitourinary: Negative for dysuria.  Musculoskeletal: Positive for joint pain (LLE).  Skin: Negative for rash.  Neurological: Negative for headaches.  All other systems reviewed and are negative.   Past Medical History:  Diagnosis Date  . Alveolar hypoventilation   . Anemia    not on iron pill  . Arthritis   . Asthma   . Bipolar 2 disorder ([place])   . Carpal tunnel syndrome on right    recurrent  . Cellulitis 08/2010-08/2011  . Chronic pain   . COPD (chronic obstructive pulmonary disease) ([name])    Symbicort daily and Proventil as needed  . Costochondritis   . Depression   . Diabetes mellitus 2000   Type 2, Uncontrolled.Takes Lantus daily.Fasting blood sugar runs 150  . Dizziness    occasionally  . Drug-seeking behavior   . GERD (gastroesophageal reflux disease)    takes Pantoprazole and Zantac daily  . Headache    migraine-last one about a yr ago.Topamax daily  . HLD (hyperlipidemia)    takes Atorvastatin daily  . Hypertension    takes Lisinopril and Coreg daily  . Morbid obesity ([place])   . Muscle spasm    takes Flexeril as needed  . Nocturia   . Obstructive sleep apnea   . Peripheral neuropathy    takes Gabapentin daily  . Pneumonia    "walking" several yrs ago and as a baby  . Rectal fissure   . Restless leg   . Urinary frequency   . Varicose veins    Right medial thigh and Left leg     Social History   Tobacco Use  . Smoking status: Former Smoker    Packs/day: 0.30    Years: 0.30    Pack years: 0.09    Types: Cigarettes    Last attempt to quit: 12/06/1993    Years since quitting: 24.1  . Smokeless  tobacco: Never Used  Substance Use Topics  . Alcohol use: No    Alcohol/week: 0.0 oz  . Drug use: Yes    Comment: OD attempts on home meds      Family History  Problem Relation Age of Onset  . Diabetes Mother   . Hyperlipidemia Mother   . Depression Mother   . Varicose Veins Mother   . Heart attack Paternal Uncle   . Heart disease Paternal Grandmother   . Heart attack Paternal Grandmother   . Heart attack Paternal Grandfather   . Heart disease Paternal Grandfather   . Heart attack Father   . Cancer Maternal Grandmother        COLON  . Hypertension Maternal Grandmother   . Hyperlipidemia Maternal  Grandmother   . Diabetes Maternal Grandmother   . Other Maternal Grandfather        GUN SHOT   Allergies  Allergen Reactions  . Kiwi Extract Shortness Of Breath and Swelling  . Nubain [Nalbuphine Hcl] Other (See Comments)    "FEELS LIKE SOMETHING CRAWLING ON ME"    OBJECTIVE: Blood pressure 117/71, pulse 88, temperature 100 F (37.8 C), temperature source Oral, resp. rate 20, height 5\' 2"  (1.575 m), weight (!) 322 lb 8.5 oz (146.3 kg), last menstrual period 01/06/2018, SpO2 95 %.  Physical Exam  Constitutional: She is oriented to person, place, and time and well-developed, well-nourished, and in no distress.  HENT:  Mouth/Throat: Oropharynx is clear and moist. No oral lesions. No dental abscesses.  Eyes: No scleral icterus.  Cardiovascular: Normal rate, regular rhythm and normal heart sounds.  Pulmonary/Chest: Effort normal and breath sounds normal.  Abdominal: Soft. She exhibits no distension. There is no tenderness.  Musculoskeletal: Normal range of motion. She exhibits edema. She exhibits no tenderness.       Feet:  Lymphadenopathy:    She has no cervical adenopathy.  Neurological: She is alert and oriented to person, place, and time.  Skin: Skin is warm and dry. No rash noted.  Psychiatric: Mood, affect and judgment normal.  Vitals reviewed.   Lab Results Lab  Results  Component Value Date   WBC 15.2 (H) 01/30/2018   HGB 9.4 (L) 01/30/2018   HCT 28.9 (L) 01/30/2018   MCV 90.9 01/30/2018   PLT 293 01/30/2018    Lab Results  Component Value Date   CREATININE 1.90 (H) 01/30/2018   BUN 35 (H) 01/30/2018   NA 130 (L) 01/30/2018   K 3.8 01/30/2018   CL 97 (L) 01/30/2018   CO2 22 01/30/2018    Lab Results  Component Value Date   ALT 14 10/30/2017   AST 11 (L) 10/30/2017   ALKPHOS 102 10/30/2017   BILITOT 0.4 10/30/2017     Microbiology: BCx 2/21 > NG BCx 2/24 > NG x 1 set   Janene Madeira, MSN, NP-C [facility] for Infectious [place] Cell: [phone] Pager: [phone]  01/30/2018 2:01 PM

## 2018-01-30 NOTE — Progress Notes (Signed)
Pharmacy Antibiotic Note  [name] is a 42 y.o. female admitted on 01/27/2018 with left foot cellulitis and osteomyelitis.   Outpatient culture grew Serratia and GBS and patient was started on antibiotics and did not improve.  Pharmacy has been consulted to dose vancomycin and Zosyn.  Patient has AKI and vancomycin trough yesterday was elevated at 69mcg/mL (noted dose was hung before level resulted). SCr increased slightly again today. ID recommends changing to Zyvox when Vanc level <71mcg/mL.   Plan: Discontinue vancomycin Vancomycin random level with AM labs tomorrow Zyvox 600mg  IV q12h starting 2/26 at 1000- will push start time back if vancomycin level is elevated tomorrow Continue Zosyn 3.375g IV Q8H EI Follow surgical plans Monitor renal fxn, clinical progress, LOT, change to CTX (outpatient cultures appear sensitive to this)  Height: 5\' 2"  (157.5 cm) Weight: (!) 322 lb 8.5 oz (146.3 kg) IBW/kg (Calculated) : 50.1  Temp (24hrs), Avg:99.8 F (37.7 C), Min:98 F (36.7 C), Max:101.4 F (38.6 C)  Recent Labs  Lab 01/26/18 1724 01/27/18 0429 01/28/18 0601 01/29/18 0536 01/29/18 0922 01/29/18 1203 01/30/18 0730  WBC 11.2* 11.7* 12.8* 14.5*  --   --  15.2*  CREATININE 0.55 0.48 0.76  --  1.83*  --  1.90*  VANCOTROUGH  --   --   --   --   --  27*  --     Estimated Creatinine Clearance: 55.6 mL/min (A) (by C-G formula based on SCr of 1.9 mg/dL (H)).    Allergies  Allergen Reactions  . Kiwi Extract Shortness Of Breath and Swelling  . Nubain [Nalbuphine Hcl] Other (See Comments)    "FEELS LIKE SOMETHING CRAWLING ON ME"   Zyvox 2/26>> Vanc 2/22 >>2/25 Zosyn 2/22 >> Doxy PTA 2/6 >> 2/22 Cipro PTA 2/11 >> 2/22  2/24 VT = 27 mcg/mL on 1g q8 >> 1g q12 (SCr 1.83)  2/22 BCx - ngtd 2/24 BCx -   Outpt cultures: Serratia and GBS (sensitive CTX, Cipro, Septra)  Jamaree Hosier D. Aiden Rao, PharmD, BCPS Clinical Pharmacist Clinical Phone for 01/30/2018 until 3:30pm: [phone] If  after 3:30pm, please call main pharmacy at [phone] 01/30/2018 12:27 PM

## 2018-01-30 NOTE — Progress Notes (Signed)
FPTS Interim Progress Note  S: Per discussion with Dr. Baxter Flattery, Infectious Disease, will discontinue vancomycin. Per Dr. Baxter Flattery should start Linezolid either on 2/26 or 2/27 but NOT till Vancomycin trough is <15.   O: BP 117/71   Pulse 88   Temp 100 F (37.8 C) (Oral)   Resp 20   Ht 5\' 2"  (1.575 m)   Wt (!) 322 lb 8.5 oz (146.3 kg)   LMP 01/06/2018 (Approximate)   SpO2 95%   BMI 58.99 kg/m     A/P: Osteomyelitis -will discontinue vancomycin -will start Linzolid on 2/26 pending vanc trough -monitor vanc trough tomorrow @5am   -follow up on recommendations by Dr. Sharol Given  AKI Cr of 1.90 today, likely 2/2 vancomycin -will continue to monitor on daily BMP -hold nephrotoxic meds -antibiotic regimen as described above   [name], DO 01/30/2018, 11:54 AM PGY-1, [place] [facility] pager [phone]

## 2018-01-30 NOTE — Progress Notes (Signed)
Family Medicine Teaching Service Daily Progress Note Intern Pager: [phone]  Patient name: [name] Medical record number: [id number] Date of birth: March 01, 1976 Age: 42 y.o. Gender: female  Primary Care Provider: [name], MD Consultants: Orthopedic surgery  Code Status: Full   Pt Overview and Major Events to Date:  Admitted to [facility] on 01/27/2018  Assessment and Plan: Belinda Banksis a 42 y.o.femalepresenting with L foot ulcer. PMH is significant foruncontrolled T2DM, COPD, HLD, HTN, OSA, bipolar disorder.  L lateral base 5th MTP footdiabetic foot ulcer with osteomyelitis  Stable compared to marked borders but has shown spreading since admission. Failed outpt doxycyline and ciprofloxacin.  MR foot concerning for osteomyelitis, ortho recommends amputation. Patient now would like to have amputation. Currently afebrile at 100F but had temp of 101.48F @2001  on 2/24. Blood cx from 2/24 pending, Blood cx from 2/22 NGTD.  - continue day #4 of IV Vanc/zosyn (2/22-) - continue maintenance fluids, hold HTN meds this AM - BCx NGx1D - continue home norco 3-325mg  q8prn - morphine >> initially held for hypotension which improved to 115/94 with fluids >> added back morphine 2 mg q4H PRN with hold parameters for hypotension - Dr. Sharol Given consulted, appreciate recommendations  - can consider ID consultation for change in antibiotics   Type 2 DM with diabetic neuropathy  Poorly controlled.  CBGs consistently elevated, 300s overnight. A1C of 13.8 on 01/27/2017. Home meds: Tresiba 106U daily withnovolog 15U at lunchtime and 25U with supper. No widened AG in BMP - rSSI, added nighttime coverage - increase lantus to 60U bid - 20U mealtime coverage  - monitor CBGs - continue gabapentin 1200mg  tid   Nausea with vomiting   May be in setting of acute infection vs. Elevated sugars overnight. We can try phenergan PRN for nausea - correct sugar, continue to treat infection - monitor nausea -  monitor electrolytes with vomiting  HTN  Currently hypotensive to 103/61. Home meds: coreg 3.125mg  BID, lisinopril 5mg  qd - fluids as above  - hold home lisinopril for hypotension - continue home coreg   COPD Stable.On symbicort 2 puffs BID with prn albuterol - dulera while admitted - prn albuterol  OA  Chronic, stable.Chronic norco 10-325mg  q8 for pain relief with flexeril prn. - continue home norco  HLD  Home meds: atorvastatin 20mg  daily  - continue home atorvastatin  FEN/GI:heart healthy carb modified diet,protonix, mIVF Prophylaxis:lovenox  Disposition: continued inpatient admission, will likely have amputation with Dr. Sharol Given   Subjective:  Patient states she is not doing well. States she feels "bad" but is unsure how to describe her symptoms. States she is nauseous and continues to have "yellow" emesis. Reports continued pain in left foot but unchanged from yesterday.   Objective: Temp:  [98 F (36.7 C)-101.4 F (38.6 C)] 100 F (37.8 C) (02/25 0443) Pulse Rate:  [88-103] 96 (02/25 0443) Resp:  [20] 20 (02/25 0443) BP: (103-150)/(58-98) 103/61 (02/25 0443) SpO2:  [95 %] 95 % (02/25 0443) Physical Exam: General: awake and alert, appears to not feel comfortable  Cardiovascular: RRR, no MR  Respiratory: CTAB, no wheezes, rales, or rhonchi  Abdomen: soft, non tender, non distended, bowel sounds normal  Extremities: LLE with new dressing places, some serosanguinous drainage noted on dressing, tender to palpation, no further erythema past marked borders   Laboratory: Recent Labs  Lab 01/28/18 0601 01/29/18 0536 01/30/18 0730  WBC 12.8* 14.5* 15.2*  HGB 11.3* 9.7* 9.4*  HCT 35.0* 30.0* 28.9*  PLT 301 289 293   Recent  Labs  Lab 01/28/18 0601 01/29/18 0922 01/30/18 0730  NA 134* 131* 130*  K 4.1 4.0 3.8  CL 98* 96* 97*  CO2 20* 24 22  BUN 17 31* 35*  CREATININE 0.76 1.83* 1.90*  CALCIUM 8.5* 8.1* 7.6*  GLUCOSE 374* 299* 168*    Results for  orders placed or performed during the hospital encounter of 01/27/18  Blood culture (routine x 2)     Status: None (Preliminary result)   Collection Time: 01/27/18  5:03 AM  Result Value Ref Range Status   Specimen Description BLOOD RIGHT ANTECUBITAL  Final   Special Requests   Final    BOTTLES DRAWN AEROBIC AND ANAEROBIC Blood Culture adequate volume   Culture   Final    NO GROWTH 2 DAYS Performed at [facility], [place] [address], [place], [place] [address]    Report Status PENDING  Incomplete  Blood culture (routine x 2)     Status: None (Preliminary result)   Collection Time: 01/27/18  5:12 AM  Result Value Ref Range Status   Specimen Description BLOOD RIGHT FOREARM  Final   Special Requests   Final    BOTTLES DRAWN AEROBIC AND ANAEROBIC Blood Culture adequate volume   Culture   Final    NO GROWTH 2 DAYS Performed at [facility], [place] [address], [place], [place] [address]    Report Status PENDING  Incomplete     Imaging/Diagnostic Tests: Mr Foot Left Wo Contrast  Result Date: 01/27/2018 CLINICAL DATA:  Worsening ulcer along the lateral base of the fifth MTP joint. EXAM: MRI OF THE LEFT FOOT WITHOUT CONTRAST TECHNIQUE: Multiplanar, multisequence MR imaging of the left forefoot was performed. No intravenous contrast was administered. COMPARISON:  None. FINDINGS: Bones/Joint/Cartilage Soft tissue ulcer along the lateral aspect of the fifth metatarsal head. Mild cortical irregularity and bone marrow edema in the fifth metatarsal head. No other marrow signal abnormality. No fracture or dislocation. Normal alignment. No joint effusion. Mild osteoarthritis of third, fourth and fifth tarsometatarsal joints. Ligaments Collateral ligaments are intact.  Lisfranc joint is intact. Muscles and Tendons Flexor, peroneal and extensor compartment tendons are intact. Muscles are normal. Soft tissue No fluid collection or hematoma. No soft tissue mass. Soft tissue edema along the dorsal  aspect of the foot and more severe around the fifth MTP joint. IMPRESSION: Soft tissue ulcer along the lateral aspect of the fifth metatarsal head. Mild cortical irregularity and bone marrow edema in the fifth metatarsal head concerning for osteomyelitis. Electronically Signed   By: Kathreen Devoid   On: 01/27/2018 11:31   Dg Foot Complete Left  Result Date: 01/26/2018 CLINICAL DATA:  Ulcer and pain on the little digit. Evaluate for osteomyelitis. EXAM: LEFT FOOT - COMPLETE 3+ VIEW COMPARISON:  09/26/2017 FINDINGS: Soft tissue irregularity and bandage over the fifth metatarsal head. No evidence of erosion, soft tissue gas, or opaque foreign body. No fracture deformity. Heel ulcer and bandage also noted.  No adjacent acute bony finding. Extensive arterial calcification. Dystrophic soft tissue calcifications in the lower leg. IMPRESSION: No evidence of osteomyelitis. No opaque foreign body or soft tissue gas. Electronically Signed   By: Monte Fantasia M.D.   On: 01/26/2018 18:17     [name], DO 01/30/2018, 9:41 AM PGY-1, [place] Intern pager: [phone], text pages welcome

## 2018-01-30 NOTE — H&P (View-Only) (Signed)
Patient ID: [name], female   DOB: 1976/10/22, 42 y.o.   MRN: [id number] Patient is seen in follow-up for the abscess osteomyelitis left foot fifth metatarsal head.  Patient has good circulation good capillary refill the rash is improving but she does have a ulcer dorsally over the fourth metatarsal which is concerning for an abscess that has extended to the fourth metatarsal.  Patient states that she is ready at this time to proceed with amputation of the fifth toe and fifth ray.  Discussed that if the abscess extends around the fourth metatarsal we would need to amputate both the fourth and fifth toes and metatarsals.  Patient states she understands wished to proceed with surgery as soon as possible.  We will plan for surgery tomorrow evening around 6:00.  Risks and benefits were discussed including potential for additional surgery nonhealing of the wound.  Patient states she understands wished to proceed at this time, her husband was on face time on the phone and he agrees with the proposed surgical plan.

## 2018-01-30 NOTE — Progress Notes (Signed)
Patient ID: [name], female   DOB: 05-Nov-1976, 42 y.o.   MRN: [id number] Patient is seen in follow-up for the abscess osteomyelitis left foot fifth metatarsal head.  Patient has good circulation good capillary refill the rash is improving but she does have a ulcer dorsally over the fourth metatarsal which is concerning for an abscess that has extended to the fourth metatarsal.  Patient states that she is ready at this time to proceed with amputation of the fifth toe and fifth ray.  Discussed that if the abscess extends around the fourth metatarsal we would need to amputate both the fourth and fifth toes and metatarsals.  Patient states she understands wished to proceed with surgery as soon as possible.  We will plan for surgery tomorrow evening around 6:00.  Risks and benefits were discussed including potential for additional surgery nonhealing of the wound.  Patient states she understands wished to proceed at this time, her husband was on face time on the phone and he agrees with the proposed surgical plan.

## 2018-01-31 ENCOUNTER — Encounter (HOSPITAL_COMMUNITY): Payer: Self-pay | Admitting: Family Medicine

## 2018-01-31 DIAGNOSIS — M86172 Other acute osteomyelitis, left ankle and foot: Secondary | ICD-10-CM

## 2018-01-31 DIAGNOSIS — D62 Acute posthemorrhagic anemia: Secondary | ICD-10-CM | POA: Insufficient documentation

## 2018-01-31 DIAGNOSIS — D649 Anemia, unspecified: Secondary | ICD-10-CM

## 2018-01-31 DIAGNOSIS — E669 Obesity, unspecified: Secondary | ICD-10-CM

## 2018-01-31 DIAGNOSIS — N179 Acute kidney failure, unspecified: Secondary | ICD-10-CM

## 2018-01-31 DIAGNOSIS — Z1629 Resistance to other single specified antibiotic: Secondary | ICD-10-CM

## 2018-01-31 DIAGNOSIS — L03032 Cellulitis of left toe: Secondary | ICD-10-CM

## 2018-01-31 HISTORY — DX: Other acute osteomyelitis, left ankle and foot: M86.172

## 2018-01-31 LAB — BASIC METABOLIC PANEL
ANION GAP: 12 (ref 5–15)
BUN: 41 mg/dL — ABNORMAL HIGH (ref 6–20)
CHLORIDE: 99 mmol/L — AB (ref 101–111)
CO2: 21 mmol/L — ABNORMAL LOW (ref 22–32)
Calcium: 7.7 mg/dL — ABNORMAL LOW (ref 8.9–10.3)
Creatinine, Ser: 1.76 mg/dL — ABNORMAL HIGH (ref 0.44–1.00)
GFR calc non Af Amer: 35 mL/min — ABNORMAL LOW (ref >60)
GFR, EST AFRICAN AMERICAN: 41 mL/min — AB (ref >60)
Glucose, Bld: 228 mg/dL — ABNORMAL HIGH (ref 65–99)
POTASSIUM: 4 mmol/L (ref 3.5–5.1)
SODIUM: 132 mmol/L — AB (ref 135–145)

## 2018-01-31 LAB — CBC
HEMATOCRIT: 27.6 % — AB (ref 36.0–46.0)
HEMOGLOBIN: 9 g/dL — AB (ref 12.0–15.0)
MCH: 29.5 pg (ref 26.0–34.0)
MCHC: 32.6 g/dL (ref 30.0–36.0)
MCV: 90.5 fL (ref 78.0–100.0)
Platelets: 288 10*3/uL (ref 150–400)
RBC: 3.05 MIL/uL — ABNORMAL LOW (ref 3.87–5.11)
RDW: 12.8 % (ref 11.5–15.5)
WBC: 12.3 10*3/uL — AB (ref 4.0–10.5)

## 2018-01-31 LAB — RETICULOCYTES
RBC.: 2.97 MIL/uL — AB (ref 3.87–5.11)
Retic Count, Absolute: 53.5 10*3/uL (ref 19.0–186.0)
Retic Ct Pct: 1.8 % (ref 0.4–3.1)

## 2018-01-31 LAB — GLUCOSE, CAPILLARY
GLUCOSE-CAPILLARY: 216 mg/dL — AB (ref 65–99)
GLUCOSE-CAPILLARY: 223 mg/dL — AB (ref 65–99)
Glucose-Capillary: 205 mg/dL — ABNORMAL HIGH (ref 65–99)
Glucose-Capillary: 178 mg/dL — ABNORMAL HIGH (ref 65–99)

## 2018-01-31 LAB — IRON AND TIBC
Iron: 20 ug/dL — ABNORMAL LOW (ref 28–170)
Saturation Ratios: 6 % — ABNORMAL LOW (ref 10.4–31.8)
TIBC: 311 ug/dL (ref 250–450)
UIBC: 291 ug/dL

## 2018-01-31 LAB — FOLATE: FOLATE: 14.6 ng/mL (ref >5.9)

## 2018-01-31 LAB — VANCOMYCIN, TROUGH: Vancomycin Tr: 11 ug/mL — ABNORMAL LOW (ref 15–20)

## 2018-01-31 LAB — VITAMIN B12: VITAMIN B 12: 281 pg/mL (ref 180–914)

## 2018-01-31 LAB — PREGNANCY, URINE: Preg Test, Ur: NEGATIVE

## 2018-01-31 LAB — FERRITIN: Ferritin: 370 ng/mL — ABNORMAL HIGH (ref 11–307)

## 2018-01-31 MED ORDER — SODIUM CHLORIDE 0.9 % IV SOLN
1000.0000 mg | INTRAVENOUS | Status: DC
  Administered 2018-01-31: 1000 mg via INTRAVENOUS
  Filled 2018-01-31 (×3): qty 20

## 2018-01-31 MED ORDER — HEPARIN SODIUM (PORCINE) 5000 UNIT/ML IJ SOLN: 5000.0000 [IU] | Freq: Three times a day (TID) | INTRAMUSCULAR | Status: DC

## 2018-01-31 MED ORDER — DEXTROSE 5 % IV SOLN
3.0000 g | INTRAVENOUS | Status: AC
  Administered 2018-02-01: 3 g via INTRAVENOUS
  Filled 2018-01-31 (×2): qty 3000

## 2018-01-31 MED ORDER — SODIUM CHLORIDE 0.9 % IV SOLN
2.0000 g | Freq: Two times a day (BID) | INTRAVENOUS | Status: DC
  Administered 2018-01-31 – 2018-02-01 (×2): 2 g via INTRAVENOUS
  Filled 2018-01-31 (×4): qty 2

## 2018-01-31 NOTE — Progress Notes (Addendum)
Pharmacy Antibiotic Note  [name] is a 42 y.o. female admitted on 01/27/2018 with left foot cellulitis and osteomyelitis.   Outpatient culture grew Serratia and GBS and patient was started on antibiotics and did not improve. Pharmacy consulted for daptomycin.  Patient has AKI and vancomycin trough 2/24 was elevated at 63mcg/mL (noted dose was hung before level resulted).  To switch to daptomycin given   Vancomycin level this morning is 94mcg/mL. SCr down to 1.76.  Plan: Daptomycin 1000mg  IV q24h Follow surgical plans Monitor renal fxn, clinical progress, LOT  Height: 5\' 2"  (157.5 cm) Weight: (!) 322 lb 8.5 oz (146.3 kg) IBW/kg (Calculated) : 50.1  Temp (24hrs), Avg:98 F (36.7 C), Min:97.1 F (36.2 C), Max:99.3 F (37.4 C)  Recent Labs  Lab 01/27/18 0429 01/28/18 0601 01/29/18 0536 01/29/18 0922 01/29/18 1203 01/30/18 0730 01/31/18 0452  WBC 11.7* 12.8* 14.5*  --   --  15.2* 12.3*  CREATININE 0.48 0.76  --  1.83*  --  1.90* 1.76*  VANCOTROUGH  --   --   --   --  27*  --  11*    Estimated Creatinine Clearance: 60 mL/min (A) (by C-G formula based on SCr of 1.76 mg/dL (H)).    Allergies  Allergen Reactions  . Kiwi Extract Shortness Of Breath and Swelling  . Nubain [Nalbuphine Hcl] Other (See Comments)    "FEELS LIKE SOMETHING CRAWLING ON ME"   Daptomycin 2/26 >> Vanc 2/22 >> 2/25 Zosyn 2/22 >> 2/26 Doxy PTA 2/6 >> 2/22 Cipro PTA 2/11 >> 2/22  2/24 VT = 27 mcg/mL on 1g q8 >> 1g q12 (SCr 1.83) 2/26 VR = 11  2/22 BCx x2 - ngtd 2/24 BCx x1 - ngtd   Outpt cultures: Serratia and GBS (sensitive CTX, Cipro, Septra)  Belinda Mamaril D. Murrell Hall, PharmD, BCPS Clinical Pharmacist Clinical Phone for 01/31/2018 until 3:30pm: [phone] If after 3:30pm, please call main pharmacy at [phone] 01/31/2018 7:43 AM

## 2018-01-31 NOTE — Progress Notes (Addendum)
Pharmacy consulted for heparin for VTE prophylaxis.  There is no dose adjustment for subQ heparin in obesity. Noted heparin will stop after the 3rd dose today in preparation for surgery tomorrow. New order for heparin to restart at 0600 on 2/28.  Plan: Heparin 5000 units subQ q8h Pharmacy to sign off    Tonni Mansour D. Fordyce Lepak, PharmD, BCPS Clinical Pharmacist Clinical Phone for 01/31/2018 until 3:30pm: [phone] If after 3:30pm, please call main pharmacy at [phone] 01/31/2018 9:18 AM

## 2018-01-31 NOTE — Progress Notes (Signed)
Per charge nurse, OR called saying Pt's possible surgery tomorrow will not pursue. Will plan to do surgery on Wednesday. Informed pt.

## 2018-01-31 NOTE — Progress Notes (Signed)
Pharmacy Antibiotic Note  [name] is a 42 y.o. female admitted on 01/27/2018 with left foot cellulitis and osteomyelitis.   Outpatient culture grew Serratia and GBS and patient was started on antibiotics and did not improve.  Pt developed AKI, Vancomycin has been switched to daptomycin this morning. No to switch zosyn to cefepime. crcl ~ 60 ml/min  Plan: Cefepime 2g Q 12 hrs  Height: 5\' 2"  (157.5 cm) Weight: (!) 322 lb 8.5 oz (146.3 kg) IBW/kg (Calculated) : 50.1  Temp (24hrs), Avg:98.7 F (37.1 C), Min:97.1 F (36.2 C), Max:100.1 F (37.8 C)  Recent Labs  Lab 01/27/18 0429 01/28/18 0601 01/29/18 0536 01/29/18 0922 01/29/18 1203 01/30/18 0730 01/31/18 0452  WBC 11.7* 12.8* 14.5*  --   --  15.2* 12.3*  CREATININE 0.48 0.76  --  1.83*  --  1.90* 1.76*  VANCOTROUGH  --   --   --   --  27*  --  11*    Estimated Creatinine Clearance: 60 mL/min (A) (by C-G formula based on SCr of 1.76 mg/dL (H)).    Allergies  Allergen Reactions  . Kiwi Extract Shortness Of Breath and Swelling  . Nubain [Nalbuphine Hcl] Other (See Comments)    "FEELS LIKE SOMETHING CRAWLING ON ME"   Cefepime 2/26 >> Daptomycin 2/26 >> Vanc 2/22 >> 2/25 Zosyn 2/22 >> 2/26 Doxy PTA 2/6 >> 2/22 Cipro PTA 2/11 >> 2/22  2/24 VT = 27 mcg/mL on 1g q8 >> 1g q12 (SCr 1.83) 2/26 VR = 11  2/22 BCx x2 - ngtd 2/24 BCx x1 - ngtd   Outpt cultures: Serratia and GBS (sensitive CTX, Cipro, Septra)  Maryanna Shape, PharmD, BCPS  Clinical Pharmacist  Pager: [phone]   01/31/2018 10:42 PM

## 2018-01-31 NOTE — Progress Notes (Signed)
[facility] for Infectious Disease  Date of Admission:  01/27/2018     Total days of antibiotics 6  Daptomycin day 1  Zosyn day 6         Patient ID: [name] is a 42 y.o. F with acute osteomyelitis of the left 5th ray/metatarsal head  Principal Problem:   Acute osteomyelitis of ankle or foot, left ([place]) Active Problems:   Diabetes mellitus ([place])   Subacute osteomyelitis, left ankle and foot ([place])   Anemia   AKI (acute kidney injury) ([place])   . atorvastatin  20 mg Oral q1800  . carvedilol  3.125 mg Oral BID WC  . chlorhexidine  60 mL Topical Once  . gabapentin  1,200 mg Oral TID  . insulin aspart  0-20 Units Subcutaneous TID WC  . insulin aspart  0-5 Units Subcutaneous QHS  . insulin aspart  5 Units Subcutaneous TID WC  . insulin glargine  60 Units Subcutaneous BID  . mometasone-formoterol  2 puff Inhalation BID  . oxyCODONE  5 mg Oral Once  . pantoprazole  40 mg Oral Daily  . povidone-iodine  2 application Topical Once    SUBJECTIVE: No changes overnight. Still having pain and drainage from her foot. Surgery has been rescheduled to tomorrow 2/27. Tolerating antibiotics well without concern for side effects. Continues on daptomycin + zosyn. Temp < 99d and WBC down a bit 12.3 overnight.    Allergies  Allergen Reactions  . Kiwi Extract Shortness Of Breath and Swelling  . Nubain [Nalbuphine Hcl] Other (See Comments)    "FEELS LIKE SOMETHING CRAWLING ON ME"    OBJECTIVE: Vitals:   01/31/18 0829 01/31/18 0845 01/31/18 1100 01/31/18 1248  BP:  129/86  114/69  Pulse:  93 87 88  Resp:  20  20  Temp:    98.9 F (37.2 C)  TempSrc:    Oral  SpO2: 95%   95%  Weight:      Height:       Body mass index is 58.99 kg/m.  Physical Exam  Constitutional: She is oriented to person, place, and time and well-developed, well-nourished, and in no distress.  Lying in bed. Appears tired.   HENT:  Mouth/Throat: No oral lesions. No dental abscesses.    Cardiovascular: Normal rate, regular rhythm and normal heart sounds.  Pulmonary/Chest: Effort normal and breath sounds normal. No respiratory distress.  Abdominal: Soft. She exhibits no distension. There is no tenderness.  Musculoskeletal: Normal range of motion. She exhibits no tenderness.  Left #5 ray wrapped in kerlix/gauze with crusty yellow drainage. Eroded blister to the top #4 MTP head. Erythema improved. Swelling still present.   Neurological: She is alert and oriented to person, place, and time.  Skin: Skin is warm and dry. No rash noted.  Psychiatric: Mood, affect and judgment normal.  Vitals reviewed.   Lab Results Lab Results  Component Value Date   WBC 12.3 (H) 01/31/2018   HGB 9.0 (L) 01/31/2018   HCT 27.6 (L) 01/31/2018   MCV 90.5 01/31/2018   PLT 288 01/31/2018    Lab Results  Component Value Date   CREATININE 1.76 (H) 01/31/2018   BUN 41 (H) 01/31/2018   NA 132 (L) 01/31/2018   K 4.0 01/31/2018   CL 99 (L) 01/31/2018   CO2 21 (L) 01/31/2018    Lab Results  Component Value Date   ALT 14 10/30/2017   AST 11 (L) 10/30/2017   ALKPHOS 102  10/30/2017   BILITOT 0.4 10/30/2017    Lab Results  Component Value Date   ESRSEDRATE 42 (H) 10/29/2017   Lab Results  Component Value Date   CRP 2.3 (H) 10/29/2017     Microbiology: BCx 2/21 >> NG  BCx 2/24 1 set > NG    ASSESSMENT: 42 y.o. obese F with poorly controlled diabetes now with acute on chronic osteomyelitis / cellulitis of the left #5 ray/MTP head. Possible involvment of the 4th digit as well. She has failed medical therapy with several courses of prolonged antibiotics by mouth and now with second hospitalization due to her diabetic foot infection. With antibiotic use history I worry about MDR organisms playing a role; prolonged wound with poor diabetic care potential concern for pseudomonas involvement.   PLAN: 1. Diabetic Foot Infection / Osteomyelitis= surrounding cellulitis appears improved;  planning debridement and amputation of the 5th +/- 4th ray by Dr. Sharol Given tomorrow. Appreciate assistance from ortho team. Would ask to send tissue for aerobic, anaerobic and fungal culture. Previously she grew serratia (R-cefazolin) and GBS from outpatient wound sample. Plan to change to cefepime + Dapto after debridement. Duration of antibiotic therapy pending surgery tomorrow.   2. AKI = creatinine improving slowly. Continue to avoid nephrotoxic agents.   3. Medication Monitoring = check CK in am for baseline while on dapto - will need weekly monitoring after that.   Janene Madeira, MSN, NP-C [facility] for Infectious [place] [facility] Cell: [phone] Pager: [phone]  01/31/2018  12:53 PM

## 2018-01-31 NOTE — Progress Notes (Addendum)
Family Medicine Teaching Service Daily Progress Note Intern Pager: [phone]  Patient name: [name] Medical record number: [id number] Date of birth: 03-23-1976 Age: 42 y.o. Gender: female  Primary Care Provider: [name], MD Consultants: Orthopedic surgery  Code Status: Full   Pt Overview and Major Events to Date:  Admitted to [facility] on 01/27/2018  Assessment and Plan: Evin Banksis a 42 y.o.femalepresenting with L foot ulcer. PMH is significant foruncontrolled T2DM, COPD, HLD, HTN, OSA, bipolar disorder.  Llateral base 5th MTPfootdiabetic foot ulcerwith osteomyelitis Stable compared to marked borders but has shown spreading since admission.Failed outptdoxycyline and ciprofloxacin. MR foot concerning for osteomyelitis, ortho recommends amputation. Surgery planned for 02/01/18. Currently afebrile at 97.51F. Blood cx from 2/22 NGTD. S/p vanc/zosyn (2/22-2/25), vanc trough 11. Leukocytosis improved from 15.2 to 12.3.  - will start to daptomycin. - BCxNGx1D - continue home norco 3-325mg  q8prn - continue morphine 2 mg q4H PRN with hold parameters for hypotension - Dr. Sharol Given consulted, appreciate recommendations. Plan for Surgery on 02/01/18 - ID consulted, appreciate recommendations. Recommend daptomycin when vanc trough <15. Per ID would like intra-operative cultures   Type 2 DM with diabetic neuropathy Poorly controlled. CBGs improving with range of 161-261 in past 24hrs. Home meds: Tresiba 106U daily withnovolog15U at lunchtime and 25U with supper. No widened AG in BMP. Patient reporting increased weakness today.  -rSSI, addednighttime coverage - continue lantus to 60U bid - 5U mealtime coverage  - monitor CBGs - continue gabapentin 1200mg  tid - will need to adjust insulin regimen in am as patient will be NPO  Anemia Hgb of 9.0 today, 12.1 on admission (BL ~13-15). Patient is not currently menstruating ad did not report GI bleeding. Unclear etiology at  this point -anemia panel  -cannot give iron at present time due to acute infection, can consider on discharge   AKI - improving  Cr increasing s/p vancomycin. Trended at 0.76>1.83>1.90>1.76. Will likely improve after change in antibiotics -monitor on daily BMP  Nausea with vomiting  May be in setting of acute infection vs. Elevated sugars. We cantry phenergan PRN for nausea - correct sugar, continue to treat infection - monitor nausea - monitor electrolytes with vomiting  Hyponatremia - improving  Na of 132 this morning.  -monitor on daily BMP  HTN Chronic, stable. Currently normotensive at 123/86.Home meds: coreg 3.125mg  BID, lisinopril 5mg  qd - fluids as above  - hold home lisinopril given hypotension during admission  - continue home coreg   COPD Stable.On symbicort 2 puffs BID with prn albuterol - dulera while admitted - prn albuterol  OA Chronic, stable.Chronic norco 10-325mg  q8 for pain relief with flexeril prn. - continue home norco  HLD Home meds: atorvastatin 20mg  daily  - continue home atorvastatin  FEN/GI:carb modified diet,protonix, NPO @midnight  Prophylaxis:SCDs  Disposition: continued inpatient stay, will have amputation on 02/01/18  Subjective:  Patient states she is "not doing well". States increased weakness and nausea. Denies CP or SOB.   Objective: Temp:  [97.1 F (36.2 C)-99.3 F (37.4 C)] 97.1 F (36.2 C) (02/26 0436) Pulse Rate:  [84-93] 93 (02/26 0845) Resp:  [20] 20 (02/26 0845) BP: (96-129)/(58-86) 129/86 (02/26 0845) SpO2:  [92 %-97 %] 95 % (02/26 0829) Physical Exam: General: awake and alert, walking in room, appears distressed  Cardiovascular: RRR, no MRG  Respiratory: CTAB, no wheezes, rales, or rhonchi  Abdomen: soft, non tender, non distended, bowel sounds normal  Extremities: LLE with dressing placed, yellow drainage noted Skin: no increase in erythema compared to  marked borders   Laboratory: Recent Labs   Lab 01/29/18 0536 01/30/18 0730 01/31/18 0452  WBC 14.5* 15.2* 12.3*  HGB 9.7* 9.4* 9.0*  HCT 30.0* 28.9* 27.6*  PLT 289 293 288   Recent Labs  Lab 01/29/18 0922 01/30/18 0730 01/31/18 0452  NA 131* 130* 132*  K 4.0 3.8 4.0  CL 96* 97* 99*  CO2 24 22 21*  BUN 31* 35* 41*  CREATININE 1.83* 1.90* 1.76*  CALCIUM 8.1* 7.6* 7.7*  GLUCOSE 299* 168* 228*     Ref. Range 01/31/2018 04:52  Vancomycin Tr Latest Ref Range: 15 - 20 ug/mL 11 (L)    Imaging/Diagnostic Tests: Mr Foot Left Wo Contrast  Result Date: 01/27/2018 CLINICAL DATA:  Worsening ulcer along the lateral base of the fifth MTP joint. EXAM: MRI OF THE LEFT FOOT WITHOUT CONTRAST TECHNIQUE: Multiplanar, multisequence MR imaging of the left forefoot was performed. No intravenous contrast was administered. COMPARISON:  None. FINDINGS: Bones/Joint/Cartilage Soft tissue ulcer along the lateral aspect of the fifth metatarsal head. Mild cortical irregularity and bone marrow edema in the fifth metatarsal head. No other marrow signal abnormality. No fracture or dislocation. Normal alignment. No joint effusion. Mild osteoarthritis of third, fourth and fifth tarsometatarsal joints. Ligaments Collateral ligaments are intact.  Lisfranc joint is intact. Muscles and Tendons Flexor, peroneal and extensor compartment tendons are intact. Muscles are normal. Soft tissue No fluid collection or hematoma. No soft tissue mass. Soft tissue edema along the dorsal aspect of the foot and more severe around the fifth MTP joint. IMPRESSION: Soft tissue ulcer along the lateral aspect of the fifth metatarsal head. Mild cortical irregularity and bone marrow edema in the fifth metatarsal head concerning for osteomyelitis. Electronically Signed   By: Kathreen Devoid   On: 01/27/2018 11:31   Dg Foot Complete Left  Result Date: 01/26/2018 CLINICAL DATA:  Ulcer and pain on the little digit. Evaluate for osteomyelitis. EXAM: LEFT FOOT - COMPLETE 3+ VIEW COMPARISON:   09/26/2017 FINDINGS: Soft tissue irregularity and bandage over the fifth metatarsal head. No evidence of erosion, soft tissue gas, or opaque foreign body. No fracture deformity. Heel ulcer and bandage also noted.  No adjacent acute bony finding. Extensive arterial calcification. Dystrophic soft tissue calcifications in the lower leg. IMPRESSION: No evidence of osteomyelitis. No opaque foreign body or soft tissue gas. Electronically Signed   By: Monte Fantasia M.D.   On: 01/26/2018 18:17    [name], DO 01/31/2018, 11:51 AM PGY-1, [place] Intern pager: [phone], text pages welcome

## 2018-01-31 NOTE — Progress Notes (Signed)
Inpatient Diabetes Program Recommendations  AACE/ADA: New Consensus Statement on Inpatient Glycemic Control (2015)  Target Ranges:  Prepandial:   less than 140 mg/dL      Peak postprandial:   less than 180 mg/dL (1-2 hours)      Critically ill patients:  140 - 180 mg/dL   Lab Results  Component Value Date   GLUCAP 216 (H) 01/31/2018   HGBA1C 13.8 (H) 01/27/2018    Review of Glycemic Control  Diabetes history: DM2 Outpatient Diabetes medications: Tresiba 106 units QAM, Novolog 25 units QD with supper, Victoza 1.8 mg QD Current orders for Inpatient glycemic control: Lantus 60 units bid, Novolog 0-20 units tidwc and hs + 5 units tidwc  HgbA1C - 13.8% - uncontrolled For amputation of 5th toe and 5th ray. Has been postponed to 2/27. Need tight glycemic control for healing and reduction of complications.  Discussed importance of reducing HgbA1C and monitoring blood sugars more frequently. Take logbook to MD for review and any needed titration.   Inpatient Diabetes Program Recommendations:     Increase Lantus to 64 units bid Increase Novolog to 10 units tidwc for meal coverage insulin if pt eats > 50% meal.   Will follow closely.  Thank you. Lorenda Peck, RD, LDN, CDE Inpatient Diabetes Coordinator [phone]

## 2018-01-31 NOTE — Progress Notes (Signed)
FPTS Interim Progress Note  S: Paged by RN that patient concerned that toes are turning white. Patient see and examined. Patient states her biggest concerns was that all of the toes on her L foot are appear very white. No numbness. No blue color. L foot is very painful and red.  O: BP 127/75 (BP Location: Left Arm)   Pulse 97   Temp 100.1 F (37.8 C) (Oral)   Resp 20   Ht 5\' 2"  (1.575 m)   Wt (!) 322 lb 8.5 oz (146.3 kg)   LMP 01/06/2018 (Approximate)   SpO2 96%   BMI 58.99 kg/m   L foot with prominent erythema and edema over forefoot. Toes are warm to touch. Sensation intact and equal compared to R foot including all 5 toes bilaterally  A/P: L foot cellulitis, worsening redness of dorsum of forefoot is by comparison making patient's toes appear whiter but compared to her R foot there is no increased pallor or cyanosis or sensation difference. - continue daptomycin - am CK ordered for baseline, will need weekly monitoring - start cefepime per ID recommendations as zosyn was stopped yesterday - plan is still for surgical intervention tomorrow with 5th ray +/- 4th ray amputation  Bufford Lope, DO 01/31/2018, 10:40 PM PGY-2, [place] [facility] pager [phone]

## 2018-01-31 NOTE — Progress Notes (Signed)
Monday evening I was informed by Carlyon Shadow RN in the OR that patient's surgery planned for Tuesday has been cancelled because Dr. Sharol Given has a conflict. The surgery will be rescheduled for Wednesday and per Darlene, Dr. Sharol Given says patient should be allowed to eat.  Will change patient's diet. Informed patient's nurse,Nellia, of change in plan for surgery.  Nellia informed the patient.

## 2018-02-01 ENCOUNTER — Encounter (HOSPITAL_COMMUNITY): Payer: Self-pay | Admitting: Anesthesiology

## 2018-02-01 ENCOUNTER — Ambulatory Visit: Payer: Self-pay | Admitting: Internal Medicine

## 2018-02-01 ENCOUNTER — Inpatient Hospital Stay (HOSPITAL_COMMUNITY): Payer: Medicaid Other | Admitting: Anesthesiology

## 2018-02-01 ENCOUNTER — Encounter (HOSPITAL_COMMUNITY)
Admission: EM | Disposition: A | Discharge status: Home Health | Payer: Self-pay | Source: Home / Self Care | Attending: Family Medicine

## 2018-02-01 DIAGNOSIS — Z89422 Acquired absence of other left toe(s): Secondary | ICD-10-CM

## 2018-02-01 HISTORY — PX: AMPUTATION: SHX166

## 2018-02-01 LAB — CBC
HEMATOCRIT: 27.4 % — AB (ref 36.0–46.0)
HEMATOCRIT: 28.9 % — AB (ref 36.0–46.0)
HEMOGLOBIN: 9.1 g/dL — AB (ref 12.0–15.0)
Hemoglobin: 8.9 g/dL — ABNORMAL LOW (ref 12.0–15.0)
MCH: 29.8 pg (ref 26.0–34.0)
MCH: 28.9 pg (ref 26.0–34.0)
MCHC: 32.5 g/dL (ref 30.0–36.0)
MCHC: 31.5 g/dL (ref 30.0–36.0)
MCV: 91.6 fL (ref 78.0–100.0)
MCV: 91.7 fL (ref 78.0–100.0)
Platelets: 341 10*3/uL (ref 150–400)
Platelets: 332 10*3/uL (ref 150–400)
RBC: 2.99 MIL/uL — AB (ref 3.87–5.11)
RBC: 3.15 MIL/uL — AB (ref 3.87–5.11)
RDW: 13.1 % (ref 11.5–15.5)
RDW: 13 % (ref 11.5–15.5)
WBC: 10.2 10*3/uL (ref 4.0–10.5)
WBC: 10.2 10*3/uL (ref 4.0–10.5)

## 2018-02-01 LAB — CK: Total CK: 73 U/L (ref 38–234)

## 2018-02-01 LAB — CULTURE, BLOOD (ROUTINE X 2)
CULTURE: NO GROWTH
Culture: NO GROWTH
SPECIAL REQUESTS: ADEQUATE
Special Requests: ADEQUATE

## 2018-02-01 LAB — SURGICAL PCR SCREEN
MRSA, PCR: NEGATIVE
STAPHYLOCOCCUS AUREUS: NEGATIVE

## 2018-02-01 LAB — BASIC METABOLIC PANEL
ANION GAP: 11 (ref 5–15)
BUN: 29 mg/dL — ABNORMAL HIGH (ref 6–20)
CALCIUM: 8.1 mg/dL — AB (ref 8.9–10.3)
CO2: 22 mmol/L (ref 22–32)
Chloride: 103 mmol/L (ref 101–111)
Creatinine, Ser: 1.22 mg/dL — ABNORMAL HIGH (ref 0.44–1.00)
GFR, EST NON AFRICAN AMERICAN: 55 mL/min — AB (ref >60)
Glucose, Bld: 96 mg/dL (ref 65–99)
POTASSIUM: 3.9 mmol/L (ref 3.5–5.1)
Sodium: 136 mmol/L (ref 135–145)

## 2018-02-01 LAB — GLUCOSE, CAPILLARY
GLUCOSE-CAPILLARY: 339 mg/dL — AB (ref 65–99)
Glucose-Capillary: 94 mg/dL (ref 65–99)
Glucose-Capillary: 98 mg/dL (ref 65–99)
Glucose-Capillary: 129 mg/dL — ABNORMAL HIGH (ref 65–99)
Glucose-Capillary: 266 mg/dL — ABNORMAL HIGH (ref 65–99)

## 2018-02-01 SURGERY — AMPUTATION, FOOT, RAY
Anesthesia: General | Site: Foot | Laterality: Left

## 2018-02-01 MED ORDER — LIDOCAINE 2% (20 MG/ML) 5 ML SYRINGE
INTRAMUSCULAR | Status: AC
  Filled 2018-02-01: qty 5

## 2018-02-01 MED ORDER — MIDAZOLAM HCL 2 MG/2ML IJ SOLN
INTRAMUSCULAR | Status: AC
  Administered 2018-02-01: 2 mg via INTRAVENOUS
  Filled 2018-02-01: qty 2

## 2018-02-01 MED ORDER — LIDOCAINE 2% (20 MG/ML) 5 ML SYRINGE
INTRAMUSCULAR | Status: DC | PRN
  Administered 2018-02-01: 60 mg via INTRAVENOUS

## 2018-02-01 MED ORDER — PHENYLEPHRINE 40 MCG/ML (10ML) SYRINGE FOR IV PUSH (FOR BLOOD PRESSURE SUPPORT)
PREFILLED_SYRINGE | INTRAVENOUS | Status: AC
  Filled 2018-02-01: qty 10

## 2018-02-01 MED ORDER — ROPIVACAINE HCL 7.5 MG/ML IJ SOLN
INTRAMUSCULAR | Status: DC | PRN
Start: 2018-02-01 — End: 2018-02-01
  Administered 2018-02-01: 20 mL

## 2018-02-01 MED ORDER — FENTANYL CITRATE (PF) 100 MCG/2ML IJ SOLN
25.0000 ug | INTRAMUSCULAR | Status: DC | PRN
  Administered 2018-02-01 (×2): 50 ug via INTRAVENOUS

## 2018-02-01 MED ORDER — PROMETHAZINE HCL 25 MG/ML IJ SOLN: 6.2500 mg | INTRAMUSCULAR | Status: DC | PRN

## 2018-02-01 MED ORDER — FENTANYL CITRATE (PF) 250 MCG/5ML IJ SOLN
INTRAMUSCULAR | Status: AC
  Filled 2018-02-01: qty 5

## 2018-02-01 MED ORDER — ONDANSETRON HCL 4 MG/2ML IJ SOLN
INTRAMUSCULAR | Status: DC | PRN
  Administered 2018-02-01: 4 mg via INTRAVENOUS

## 2018-02-01 MED ORDER — FENTANYL CITRATE (PF) 100 MCG/2ML IJ SOLN
INTRAMUSCULAR | Status: AC
  Filled 2018-02-01: qty 2

## 2018-02-01 MED ORDER — DEXAMETHASONE SODIUM PHOSPHATE 10 MG/ML IJ SOLN
INTRAMUSCULAR | Status: DC | PRN
  Administered 2018-02-01: 5 mg via INTRAVENOUS

## 2018-02-01 MED ORDER — 0.9 % SODIUM CHLORIDE (POUR BTL) OPTIME
TOPICAL | Status: DC | PRN
  Administered 2018-02-01: 1000 mL

## 2018-02-01 MED ORDER — ROCURONIUM BROMIDE 10 MG/ML (PF) SYRINGE
PREFILLED_SYRINGE | INTRAVENOUS | Status: AC
  Filled 2018-02-01: qty 5

## 2018-02-01 MED ORDER — FENTANYL CITRATE (PF) 100 MCG/2ML IJ SOLN
50.0000 ug | Freq: Once | INTRAMUSCULAR | Status: AC
  Administered 2018-02-01: 50 ug via INTRAVENOUS

## 2018-02-01 MED ORDER — LACTATED RINGERS IV SOLN
INTRAVENOUS | Status: DC | PRN
  Administered 2018-02-01: 09:00:00 via INTRAVENOUS

## 2018-02-01 MED ORDER — PHENYLEPHRINE 40 MCG/ML (10ML) SYRINGE FOR IV PUSH (FOR BLOOD PRESSURE SUPPORT)
PREFILLED_SYRINGE | INTRAVENOUS | Status: DC | PRN
  Administered 2018-02-01: 80 ug via INTRAVENOUS

## 2018-02-01 MED ORDER — FENTANYL CITRATE (PF) 100 MCG/2ML IJ SOLN
INTRAMUSCULAR | Status: AC
  Administered 2018-02-01: 50 ug via INTRAVENOUS
  Filled 2018-02-01: qty 2

## 2018-02-01 MED ORDER — PROPOFOL 10 MG/ML IV BOLUS
INTRAVENOUS | Status: DC | PRN
  Administered 2018-02-01: 200 mg via INTRAVENOUS

## 2018-02-01 MED ORDER — OXYCODONE HCL 5 MG PO TABS: 5.0000 mg | ORAL_TABLET | Freq: Once | ORAL | Status: DC | PRN

## 2018-02-01 MED ORDER — ONDANSETRON HCL 4 MG/2ML IJ SOLN
INTRAMUSCULAR | Status: AC
  Filled 2018-02-01: qty 2

## 2018-02-01 MED ORDER — INSULIN GLARGINE 100 UNIT/ML ~~LOC~~ SOLN
40.0000 [IU] | Freq: Two times a day (BID) | SUBCUTANEOUS | Status: DC
  Administered 2018-02-01: 40 [IU] via SUBCUTANEOUS
  Filled 2018-02-01 (×3): qty 0.4

## 2018-02-01 MED ORDER — OXYCODONE HCL 5 MG/5ML PO SOLN: 5.0000 mg | Freq: Once | ORAL | Status: DC | PRN

## 2018-02-01 MED ORDER — PROPOFOL 10 MG/ML IV BOLUS
INTRAVENOUS | Status: AC
  Filled 2018-02-01: qty 40

## 2018-02-01 MED ORDER — MIDAZOLAM HCL 2 MG/2ML IJ SOLN
2.0000 mg | Freq: Once | INTRAMUSCULAR | Status: AC
  Administered 2018-02-01: 2 mg via INTRAVENOUS

## 2018-02-01 MED ORDER — DEXAMETHASONE SODIUM PHOSPHATE 10 MG/ML IJ SOLN
INTRAMUSCULAR | Status: AC
  Filled 2018-02-01: qty 1

## 2018-02-01 MED ORDER — LINEZOLID 600 MG/300ML IV SOLN
600.0000 mg | INTRAVENOUS | Status: AC
  Administered 2018-02-01: 600 mg via INTRAVENOUS
  Filled 2018-02-01: qty 300

## 2018-02-01 MED ORDER — MIDAZOLAM HCL 2 MG/2ML IJ SOLN
INTRAMUSCULAR | Status: AC
  Filled 2018-02-01: qty 2

## 2018-02-01 SURGICAL SUPPLY — 28 items
BLADE SAW SGTL MED 73X18.5 STR (BLADE) ×1 IMPLANT
BLADE SURG 21 STRL SS (BLADE) ×2 IMPLANT
BNDG COHESIVE 4X5 TAN STRL (GAUZE/BANDAGES/DRESSINGS) ×2 IMPLANT
BNDG COHESIVE 6X5 TAN STRL LF (GAUZE/BANDAGES/DRESSINGS) ×1 IMPLANT
BNDG GAUZE ELAST 4 BULKY (GAUZE/BANDAGES/DRESSINGS) ×2 IMPLANT
COVER SURGICAL LIGHT HANDLE (MISCELLANEOUS) ×4 IMPLANT
DRAPE U-SHAPE 47X51 STRL (DRAPES) ×4 IMPLANT
DRSG ADAPTIC 3X8 NADH LF (GAUZE/BANDAGES/DRESSINGS) ×2 IMPLANT
DRSG PAD ABDOMINAL 8X10 ST (GAUZE/BANDAGES/DRESSINGS) ×3 IMPLANT
DURAPREP 26ML APPLICATOR (WOUND CARE) ×2 IMPLANT
ELECT REM PT RETURN 9FT ADLT (ELECTROSURGICAL) ×2
ELECTRODE REM PT RTRN 9FT ADLT (ELECTROSURGICAL) ×1 IMPLANT
GAUZE SPONGE 4X4 12PLY STRL (GAUZE/BANDAGES/DRESSINGS) ×2 IMPLANT
GLOVE BIOGEL PI IND STRL 9 (GLOVE) ×1 IMPLANT
GLOVE BIOGEL PI INDICATOR 9 (GLOVE) ×1
GLOVE SURG ORTHO 9.0 STRL STRW (GLOVE) ×2 IMPLANT
GOWN STRL REUS W/ TWL XL LVL3 (GOWN DISPOSABLE) ×2 IMPLANT
GOWN STRL REUS W/TWL XL LVL3 (GOWN DISPOSABLE) ×4
KIT BASIN OR (CUSTOM PROCEDURE TRAY) ×2 IMPLANT
KIT ROOM TURNOVER OR (KITS) ×2 IMPLANT
NS IRRIG 1000ML POUR BTL (IV SOLUTION) ×2 IMPLANT
PACK ORTHO EXTREMITY (CUSTOM PROCEDURE TRAY) ×2 IMPLANT
PAD ARMBOARD 7.5X6 YLW CONV (MISCELLANEOUS) ×4 IMPLANT
STOCKINETTE IMPERVIOUS LG (DRAPES) IMPLANT
SUT ETHILON 2 0 PSLX (SUTURE) ×2 IMPLANT
TOWEL OR 17X26 10 PK STRL BLUE (TOWEL DISPOSABLE) ×2 IMPLANT
TUBE CONNECTING 12X1/4 (SUCTIONS) ×2 IMPLANT
YANKAUER SUCT BULB TIP NO VENT (SUCTIONS) ×2 IMPLANT

## 2018-02-01 NOTE — Anesthesia Procedure Notes (Signed)
Procedure Name: LMA Insertion Date/Time: 02/01/2018 9:52 AM Performed by: Freddie Breech, CRNA Pre-anesthesia Checklist: Patient identified, Emergency Drugs available, Suction available and Patient being monitored Patient Re-evaluated:Patient Re-evaluated prior to induction Oxygen Delivery Method: Circle System Utilized Preoxygenation: Pre-oxygenation with 100% oxygen Induction Type: IV induction Ventilation: Mask ventilation without difficulty LMA: LMA inserted and LMA with gastric port inserted LMA Size: 4.0 Number of attempts: 1 Airway Equipment and Method: Bite block Placement Confirmation: positive ETCO2 Tube secured with: Tape Dental Injury: Teeth and Oropharynx as per pre-operative assessment

## 2018-02-01 NOTE — Evaluation (Signed)
Physical Therapy Evaluation Patient Details Name: [name] MRN: [id number] DOB: [birth year] Today's Date: 02/01/2018   History of Present Illness  42 y.o. female s/p Left 4th and 5th ray amputation due to osteomyelitis. PMH includes: DM, Anemia, AKI, COPD, Cellulitis, morbid obesity, HTN, HLD, Pneumonia, Bipolar 2 disorder, Peripheral neuropathy    Clinical Impression  Patient is s/p above surgery resulting in functional limitations due to the deficits listed below (see PT Problem List). PTA, pt living at home with husband and daughter in 1 story home with stairs to enter. Pt was household ambulatory, with nursing aide help arpox 2 hours a day/7 days a week. Upon eval patient presents with high levels of post pain, NWB status, morbid obesity, and balance deficits that limit her mobility. Patient assisting with bed mobility and stand pivot transfer to [facility], mod A level. Unable to safely progress to ambulation at this time. Extensive discussion with patient regarding safety for mobility given NWB status and PTs recommendation for SNF level rehab prior to safe return home. Patient adamant at this time that she will be going home and "figuring it out" with her husband who was not present. Of note patients husband works and pt reports she is unsupervised 5 hours a day. At this time pt is a fall risk and requires +2 assistance for OOB mobility. Pt did state that she would be open to HHPT, so this would be a better option than nothing. Will discuss with patient and husband further next visit.    Patient will benefit from skilled PT to increase their independence and safety with mobility to allow discharge to the venue listed below.       Follow Up Recommendations Home health PT;Supervision/Assistance - 24 hour (SNF- however patient declining).     Equipment Recommendations  Rolling walker with 5" wheels;3in1 (PT)(Bari Gilford Rile, [place] 3 in 1)    Recommendations for Other Services       Precautions /  Restrictions Precautions Precautions: Fall Restrictions Weight Bearing Restrictions: Yes LLE Weight Bearing: Non weight bearing      Mobility  Bed Mobility Overal bed mobility: Needs Assistance Bed Mobility: Supine to Sit;Sit to Supine     Supine to sit: Min assist Sit to supine: Min assist   General bed mobility comments: Min A to help surgical leg over EOB, patient can tolerate sitting EOB for >5 minutes without feet supported.   Transfers Overall transfer level: Needs assistance   Transfers: Stand Pivot Transfers;Sit to/from Stand Sit to Stand: Mod assist Stand pivot transfers: Mod assist       General transfer comment: Mod A to assist patient into standing and pivoting on R leg into BSC- and back into EOB. Patient with adherence to NWB status, however unable to safely progress to static standing or ambulation.   Ambulation/Gait             General Gait Details: unable to safely to do at this time.   Stairs            Wheelchair Mobility    Modified Rankin (Stroke Patients Only)       Balance Overall balance assessment: Needs assistance   Sitting balance-Leahy Scale: Good       Standing balance-Leahy Scale: Poor Standing balance comment: can not static stand without mod-max A.                              Pertinent Vitals/Pain Pain Location:  L foot Pain Descriptors / Indicators: Throbbing;Constant    Home Living Family/patient expects to be discharged to:: Private residence Living Arrangements: Spouse/significant other;Children Available Help at Discharge: Family;Available PRN/intermittently(family out during the day for class and work. ) Type of Home: House Home Access: Stairs to enter Entrance Stairs-Rails: None Technical brewer of Steps: 4 Home Layout: One level Home Equipment: None      Prior Function Level of Independence: Needs assistance   Gait / Transfers Assistance Needed: household ambulation  ADL's /  Homemaking Assistance Needed: CNA 7x a week assists with ADLs        Hand Dominance        Extremity/Trunk Assessment                Communication   Communication: No difficulties  Cognition Arousal/Alertness: Awake/alert Behavior During Therapy: WFL for tasks assessed/performed                                          General Comments      Exercises     Assessment/Plan    PT Assessment Patient needs continued PT services  PT Problem List Decreased strength;Decreased range of motion;Decreased activity tolerance;Decreased balance;Decreased mobility;Decreased safety awareness;Decreased knowledge of use of DME;Pain       PT Treatment Interventions DME instruction;Stair training;Gait training;Functional mobility training;Therapeutic activities;Therapeutic exercise    PT Goals (Current goals can be found in the Care Plan section)  Acute Rehab PT Goals Patient Stated Goal: go home no matter what PT Goal Formulation: With patient Time For Goal Achievement: 02/08/18 Potential to Achieve Goals: Fair    Frequency Min 4X/week(Pt asserts she is going home. )   Barriers to discharge Decreased caregiver support      Co-evaluation               AM-PAC PT "6 Clicks" Daily Activity  Outcome Measure Difficulty turning over in bed (including adjusting bedclothes, sheets and blankets)?: Unable Difficulty moving from lying on back to sitting on the side of the bed? : Unable Difficulty sitting down on and standing up from a chair with arms (e.g., wheelchair, bedside commode, etc,.)?: Unable Help needed moving to and from a bed to chair (including a wheelchair)?: A Lot Help needed walking in hospital room?: A Lot Help needed climbing 3-5 steps with a railing? : Total 6 Click Score: 8    End of Session Equipment Utilized During Treatment: Gait belt Activity Tolerance: Patient limited by fatigue Patient left: in bed;with call bell/phone within  reach;with family/visitor present   PT Visit Diagnosis: Unsteadiness on feet (R26.81);Other abnormalities of gait and mobility (R26.89);Pain Pain - Right/Left: Left Pain - part of body: Ankle and joints of foot    Time: 1550-1620 PT Time Calculation (min) (ACUTE ONLY): 30 min   Charges:   PT Evaluation $PT Eval Moderate Complexity: 1 Mod PT Treatments $Therapeutic Activity: 8-22 mins   PT G Codes:       Reinaldo Berber, PT, DPT Acute Rehab Services Pager: [phone]    Reinaldo Berber 02/01/2018, 9:34 PM

## 2018-02-01 NOTE — Anesthesia Preprocedure Evaluation (Addendum)
Anesthesia Evaluation  Patient identified by MRN, date of birth, ID band Patient awake    Reviewed: Allergy & Precautions, NPO status , Patient's Chart, lab work & pertinent test results, reviewed documented beta blocker date and time   Airway Mallampati: III  TM Distance: >3 FB Neck ROM: Full    Dental  (+) Dental Advisory Given, Teeth Intact   Pulmonary asthma , sleep apnea and Continuous Positive Airway Pressure Ventilation , COPD, former smoker,    Pulmonary exam normal breath sounds clear to auscultation       Cardiovascular hypertension, Pt. on medications and Pt. on home beta blockers + Peripheral Vascular Disease  Normal cardiovascular exam Rhythm:Regular Rate:Normal  '18 TTE -  Ejection fraction was in the range of 55%   to 60%. Left atrium was mildly dilated.   Neuro/Psych  Headaches, PSYCHIATRIC DISORDERS Depression Bipolar Disorder Diabetic neuropathy  Neuromuscular disease    GI/Hepatic Neg liver ROS, GERD  Medicated and Controlled,  Endo/Other  diabetes, Poorly Controlled, Type 2, Insulin DependentMorbid obesity  Renal/GU Renal InsufficiencyRenal disease  negative genitourinary   Musculoskeletal  (+) Arthritis , Osteoarthritis,  Osteomyelitis left foot   Abdominal   Peds  Hematology  (+) anemia ,   Anesthesia Other Findings Hyponatremia, hypocalcemia; hx chronic pain and drug seeking behavior  Reproductive/Obstetrics                                                           Anesthesia Evaluation  Patient identified by MRN, date of birth, ID band Patient awake    Reviewed: Allergy & Precautions, H&P , NPO status , Patient's Chart, lab work & pertinent test results, reviewed documented beta blocker date and time   History of Anesthesia Complications Negative for: history of anesthetic complications  Airway Mallampati: III  Neck ROM: Limited    Dental  (+) Teeth Intact  and Dental Advisory Given   Pulmonary asthma , sleep apnea and Continuous Positive Airway Pressure Ventilation , COPD COPD inhaler,    + decreased breath sounds      Cardiovascular hypertension, Pt. on medications and Pt. on home beta blockers + angina Rhythm:Regular Rate:Normal  Cardiac cath 2013 - "clear per patient"   Neuro/Psych  Neuromuscular disease    GI/Hepatic Neg liver ROS, GERD-  Medicated and Controlled,  Endo/Other  diabetes, Type 2, Oral Hypoglycemic Agents and Insulin DependentMorbid obesity  Renal/GU negative Renal ROS     Musculoskeletal negative musculoskeletal ROS (+)   Abdominal (+) + obese,   Peds  Hematology negative hematology ROS (+)   Anesthesia Other Findings   Reproductive/Obstetrics negative OB ROS                          Anesthesia Physical Anesthesia Plan  ASA: IV  Anesthesia Plan: General   Post-op Pain Management:    Induction: Intravenous  Airway Management Planned: Oral ETT and Video Laryngoscope Planned  Additional Equipment:   Intra-op Plan:   Post-operative Plan: Extubation in OR and Possible Post-op intubation/ventilation  Informed Consent: I have reviewed the patients History and Physical, chart, labs and discussed the procedure including the risks, benefits and alternatives for the proposed anesthesia with the patient or authorized representative who has indicated his/her understanding and acceptance.   Dental advisory given  Plan Discussed with: CRNA and Surgeon  Anesthesia Plan Comments:         Anesthesia Quick Evaluation  Anesthesia Physical Anesthesia Plan  ASA: III  Anesthesia Plan: General   Post-op Pain Management:  Regional for Post-op pain   Induction: Intravenous  PONV Risk Score and Plan: 4 or greater and Ondansetron, Treatment may vary due to age or medical condition, Dexamethasone and Midazolam  Airway Management Planned: LMA  Additional Equipment:    Intra-op Plan:   Post-operative Plan: Extubation in OR  Informed Consent: I have reviewed the patients History and Physical, chart, labs and discussed the procedure including the risks, benefits and alternatives for the proposed anesthesia with the patient or authorized representative who has indicated his/her understanding and acceptance.   Dental advisory given  Plan Discussed with: CRNA  Anesthesia Plan Comments:      Anesthesia Quick Evaluation

## 2018-02-01 NOTE — Progress Notes (Signed)
PT Cancellation Note  Patient Details Name: [name] MRN: [id number] DOB: 12-21-1975   Cancelled Treatment:    Reason Eval/Treat Not Completed: Patient declined and stated she is feeling nauseated. Spoke with RN who states she will provide meds upon return to unit. Will check back this afternoon as schedule allows to attempt PT evaluation again.   [name] 02/01/2018, 12:14 PM   Rolinda Roan, PT, DPT Acute Rehabilitation Services Pager: [phone]

## 2018-02-01 NOTE — Progress Notes (Addendum)
[facility] for Infectious Disease  Date of Admission:  01/27/2018     Total days of antibiotics 7  Daptomycin   Cefepime          Patient ID: [name] is a 42 y.o. F with acute osteomyelitis of the left 5th ray/metatarsal head now s/p day 0 for 4th and 5th ray amputation  Principal Problem:   Acute osteomyelitis of ankle or foot, left ([name]) Active Problems:   Diabetes mellitus ([place])   Subacute osteomyelitis, left ankle and foot ([place])   Anemia   AKI (acute kidney injury) ([place])   . atorvastatin  20 mg Oral q1800  . carvedilol  3.125 mg Oral BID WC  . fentaNYL      . gabapentin  1,200 mg Oral TID  . insulin aspart  0-20 Units Subcutaneous TID WC  . insulin aspart  0-5 Units Subcutaneous QHS  . insulin aspart  5 Units Subcutaneous TID WC  . insulin glargine  40 Units Subcutaneous BID  . mometasone-formoterol  2 puff Inhalation BID  . oxyCODONE  5 mg Oral Once  . pantoprazole  40 mg Oral Daily    SUBJECTIVE: Feeling OK from surgery trouble with some post-op nausea and throbbing pain to her foot.   Op Note Reviewed: abscess extending from 4th to 5th metatarsal - 4th and 5th metatarsal were resected from the base. Debridement of soft tissue was performed until health viable tissue was identified. Thorough irrigation was performed. Strict non-weight bearing instructions and 3 days of antibiotics were recommended by Dr. Sharol Given. No cultures were sent to pathology.   Difficulty voiding after anesthesia - I&O cath required.   Allergies  Allergen Reactions  . Kiwi Extract Shortness Of Breath and Swelling  . Nubain [Nalbuphine Hcl] Other (See Comments)    "FEELS LIKE SOMETHING CRAWLING ON ME"    OBJECTIVE: Vitals:   02/01/18 1115 02/01/18 1124 02/01/18 1143 02/01/18 1144  BP: 124/79 127/87 107/68   Pulse: 83 84 86   Resp: 15 12 16    Temp:  (!) 97.3 F (36.3 C) 97.7 F (36.5 C)   TempSrc:   Axillary   SpO2: 97% 96% 90% 94%  Weight:      Height:        Body mass index is 58.99 kg/m.  Physical Exam  Constitutional: She is oriented to person, place, and time and well-developed, well-nourished, and in no distress.  HENT:  Mouth/Throat: No oral lesions. No dental abscesses.  Cardiovascular: Normal rate, regular rhythm and normal heart sounds.  Pulmonary/Chest: Effort normal. No respiratory distress. She has wheezes.  Abdominal: Soft. She exhibits no distension. There is no tenderness.  Musculoskeletal: Normal range of motion. She exhibits no tenderness.  Surgical dressing in place s/p 4-5 ray amputation. No swelling observed.   Neurological: She is alert and oriented to person, place, and time.  Skin: Skin is warm and dry. No rash noted.  Psychiatric: Mood, affect and judgment normal.  Vitals reviewed.   Lab Results Lab Results  Component Value Date   WBC 10.2 02/01/2018   HGB 8.9 (L) 02/01/2018   HCT 27.4 (L) 02/01/2018   MCV 91.6 02/01/2018   PLT 341 02/01/2018    Lab Results  Component Value Date   CREATININE 1.22 (H) 02/01/2018   BUN 29 (H) 02/01/2018   NA 136 02/01/2018   K 3.9 02/01/2018   CL 103 02/01/2018   CO2 22 02/01/2018    Lab Results  Component Value Date   ALT 14 10/30/2017   AST 11 (L) 10/30/2017   ALKPHOS 102 10/30/2017   BILITOT 0.4 10/30/2017    Lab Results  Component Value Date   ESRSEDRATE 42 (H) 10/29/2017   Lab Results  Component Value Date   CRP 2.3 (H) 10/29/2017     Microbiology: BCx 2/21 >> NG  BCx 2/24 1 set > NG    ASSESSMENT: 42 y.o. obese F with poorly controlled diabetes now with acute on chronic osteomyelitis / cellulitis of the left #5 ray/MTP head. Possible involvment of the 4th digit as well. She has failed medical therapy with several courses of prolonged antibiotics by mouth and now with second hospitalization due to her diabetic foot infection. With antibiotic use history raises concern regarding MDR organisms playing a role.   PLAN: Diabetic Foot Infection /  Osteomyelitis=  large abscess extending from 5th to 4th ray - now both are s/p amputation and debridement. For tonight continue IV Dapto + Cefepime with plan to transition to short course of oral therapy tomorrow.   AKI = creatinine improving. Continue to avoid nephrotoxic agents.   Medication Monitoring = CK normal at 73 for baseline. WBC normalized prior to OR this morning.   Janene Madeira, MSN, NP-C  [facility] Cell: [phone] Pager: [phone]  02/01/2018  1:58 PM

## 2018-02-01 NOTE — Transfer of Care (Signed)
Immediate Anesthesia Transfer of Care Note  Patient: [name]  Procedure(s) Performed: LEFT FOURTH AND 5TH RAY AMPUTATION (Left Foot)  Patient Location: PACU  Anesthesia Type:GA combined with regional for post-op pain  Level of Consciousness: drowsy and patient cooperative  Airway & Oxygen Therapy: Patient Spontanous Breathing and Patient connected to face mask oxygen  Post-op Assessment: Report given to RN and Post -op Vital signs reviewed and stable  Post vital signs: Reviewed and stable  Last Vitals:  Vitals:   02/01/18 0908 02/01/18 0910  BP:    Pulse: 87 85  Resp: 17 14  Temp:    SpO2: 93% 95%    Last Pain:  Vitals:   02/01/18 0910  TempSrc:   PainSc: 0-No pain      Patients Stated Pain Goal: 0 (77/41/28 7867)  Complications: No apparent anesthesia complications

## 2018-02-01 NOTE — Progress Notes (Signed)
The patient called for a nurse to come to her room.  On arrival I found the patient lying in the bed, and her left foot ace wrap was stained with a small amt of fresh blood. She stated that her husband had walked her into the bathroom, but she attempted to get back to bed on her own. "When I stood up I fell forward and landed on my knees, primarily my right knee". Her vital signs were 139/77, pulse 95, sat 98% on room air. No complaint of excessive pain. Dr. [name] notified. Will continue to monitor. Patient reminded to call for help when oob.

## 2018-02-01 NOTE — Interval H&P Note (Signed)
History and Physical Interval Note:  02/01/2018 7:21 AM  [name]  has presented today for surgery, with the diagnosis of OSTEOMYELITIS LEFT FOOT  The various methods of treatment have been discussed with the patient and family. After consideration of risks, benefits and other options for treatment, the patient has consented to  Procedure(s): [place] (Left) as a surgical intervention .  The patient's history has been reviewed, patient examined, no change in status, stable for surgery.  I have reviewed the patient's chart and labs.  Questions were answered to the patient's satisfaction.     [name]

## 2018-02-01 NOTE — Anesthesia Postprocedure Evaluation (Signed)
Anesthesia Post Note  Patient: [name]  Procedure(s) Performed: LEFT FOURTH AND 5TH TOE RAY AMPUTATION (Left Foot)     Patient location during evaluation: PACU Anesthesia Type: General Level of consciousness: awake and alert Pain management: pain level controlled Vital Signs Assessment: post-procedure vital signs reviewed and stable Respiratory status: spontaneous breathing, nonlabored ventilation, respiratory function stable and patient connected to nasal cannula oxygen Cardiovascular status: blood pressure returned to baseline and stable Postop Assessment: no apparent nausea or vomiting Anesthetic complications: no    Last Vitals:  Vitals:   02/01/18 1045 02/01/18 1100  BP: 138/88 (P) 127/89  Pulse: 84 82  Resp: 19 16  Temp:    SpO2: 97% 94%                  Audry Pili

## 2018-02-01 NOTE — Progress Notes (Signed)
D; unable to void, bladder scan done, 728ml noted. Notified Dr Fransisco Beau I&O cath order received

## 2018-02-01 NOTE — Anesthesia Procedure Notes (Signed)
Anesthesia Regional Block: Ankle block   Pre-Anesthetic Checklist: ,, timeout performed, Correct Patient, Correct Site, Correct Laterality, Correct Procedure, Correct Position, site marked, Risks and benefits discussed,  Surgical consent,  Pre-op evaluation,  At surgeon's request and post-op pain management  Laterality: Left  Prep: chloraprep       Needles:  Injection technique: Single-shot  Needle Type: Other      Needle Gauge: 25     Additional Needles:   Narrative:  Start time: 02/01/2018 9:06 AM End time: 02/01/2018 9:10 AM Injection made incrementally with aspirations every 5 mL.  Performed by: Personally  Anesthesiologist: Audry Pili, MD  Additional Notes: Anatomical landmarks used, no ultrasound. No pain on injection. No increased resistance to injection. Injection made in 5cc increments. Patient tolerated the procedure well.

## 2018-02-01 NOTE — Op Note (Signed)
01/27/2018 - 02/01/2018  10:07 AM  PATIENT:  [name]    PRE-OPERATIVE DIAGNOSIS:  OSTEOMYELITIS LEFT FOOT  POST-OPERATIVE DIAGNOSIS:  Same  PROCEDURE:  LEFT FOURTH AND 5TH RAY AMPUTATION Local trainer local tissue rearrangement for wound closure 5 x 7 cm.  SURGEON:  [name], MD  PHYSICIAN ASSISTANT:None ANESTHESIA:   General  PREOPERATIVE INDICATIONS:  [name] is a  42 y.o. female with a diagnosis of OSTEOMYELITIS LEFT FOOT who failed conservative measures and elected for surgical management.    The risks benefits and alternatives were discussed with the patient preoperatively including but not limited to the risks of infection, bleeding, nerve injury, cardiopulmonary complications, the need for revision surgery, among others, and the patient was willing to proceed.  OPERATIVE IMPLANTS: None  @ENCIMAGES @  OPERATIVE FINDINGS: Abscess extended around the fourth metatarsal.  Patient was brought the operating room and underwent a general anesthetic.  After adequate levels of anesthesia were obtained patient's left lower extremity was prepped using DuraPrep draped into a sterile field a timeout was called.  OPERATIVE PROCEDURE: Elliptical incision was made around the fifth metatarsal.  This revealed an abscess that extended around the fourth metatarsal.  The soft tissue incision was extended around the fourth metatarsal in the fourth and fifth metatarsal were resected from the base.  These were resected in one block of tissue.  Further debridement of the soft tissue was performed medial to the incision this was debrided back to healthy viable tissue.  The wound was irrigated with normal saline.  Electrocautery was used for hemostasis.  Local tissue rearrangement was used to close the wound that was 5 x 7 cm.  Sterile dressing was applied patient was extubated taken the PACU in stable condition.  [employer]   DISCHARGE PLANNING:  Antibiotic duration: Continue IV antibiotics for 3  days postoperatively.  Weightbearing: Strict nonweightbearing on the left  Pain medication: As needed  Dressing care/ Wound VAC: Leave dressing intact for 1 week.  Ambulatory devices: Walker.  Discharge to: Home once safe with therapy.  Follow-up: In the office 1 week post operative.

## 2018-02-01 NOTE — Progress Notes (Addendum)
Family Medicine Teaching Service Daily Progress Note Intern Pager: [phone]  Patient name: [name] Medical record number: [id number] Date of birth: 1976-08-14 Age: 42 y.o. Gender: female  Primary Care Provider: [name], MD Consultants: Orthopedic surgery, ID  Code Status: Full  Pt Overview and Major Events to Date:  Admitted to [facility] on 01/27/18  Assessment and Plan: Belinda Banksis a 42 y.o.femalepresenting with L foot ulcer. PMH is significant foruncontrolled T2DM, COPD, HLD, HTN, OSA, bipolar disorder.  Llateral base 5th MTPfootdiabetic foot ulcerwith osteomyelitis Erythema slightly above marked borders.Surgery planned for 02/01/18, per interval H&P scheduled for amputation of 5th ray and possible 4th. Currently afebrile at 63F. Blood cx from 2/22 and repeat Blood cx NGTD.S/p vanc/zosyn (2/22-2/25). Leukocytosis resolved and now 10.2.  - continue daptomycin and cefepime, per ID recommendations - continue home norco 3-325mg  q8prn - continue morphine 2 mg q4H PRN with hold parameters for hypotension - Dr. Sharol Given consulted, appreciate recommendations. Plan for Surgery on 02/01/18 - ID consulted, appreciate recommendations. Recommend daptomycin and cefepime. Per ID would like intra-operative cultures  - PT consult  Type 2 DM with diabetic neuropathy Poorly controlled. CBGs improving with range of 178-261 in past 24hrs. Home meds: Tresiba 106U daily withnovolog15U at lunchtime and 25U with supper.No widened AG in BMP. Patient reporting increased weakness today.  -rSSI, with addednighttime coverage -Will decrease lantus to 40 U bid (previously 60 U bid) given NPO status prior to surgery  - 5U mealtime coverage - monitor CBGs closely  - continue gabapentin 1200mg  tid  Anemia Hgb of 8.9 today, 12.1 on admission (BL ~13-15). Patient is not currently menstruating ad did not report GI bleeding. Unclear etiology at this point. Anemia panel showing Iron of 20,  TIBC wnl, and Ferritin of 370.  -cannot give iron at present time due to acute infection, can consider on discharge -repeat CBC @4pm     AKI - improving  Cr increased s/p vancomycin. Trended at 0.76>1.83>1.90>1.76>1.22. Will likely improve after change in antibiotics -monitor on daily BMP  Nausea with vomiting - resolved No longer having symptoms. May be in setting of acute infection vs. Elevated sugars. We cantry phenergan PRN for nausea - correct sugar, continue to treat infection - monitor nausea - monitor electrolytes with vomiting  Hyponatremia - resolved  Na of 136 this morning.  -monitor on daily BMP  HTN Chronic, stable. Currently slightly hypotensive at 111/57.Home meds: coreg 3.125mg  BID, lisinopril 5mg  qd - fluids as above  - hold home lisinopril given hypotension during admission  -continue home coreg  COPD Stable.On symbicort 2 puffs BID with prn albuterol - dulera while admitted - prn albuterol  OA Chronic, stable.Chronic norco 10-325mg  q8 for pain relief with flexeril prn. - continue home norco  HLD Home meds: atorvastatin 20mg  daily  - continue home atorvastatin  FEN/GI:carb modified diet,protonix, NPO @midnight  Prophylaxis:SCDs  Disposition: surgery planned for today   Subjective:  Very tearful on exam. States pain in LLE but has not had pain medications yet. Denies further feelings of weakness. Denies CP or SOB. States she is scared of procedure but understands that best efforts were made to avoid amputation.   Objective: Temp:  [98.9 F (37.2 C)-100.1 F (37.8 C)] 99 F (37.2 C) (02/27 0452) Pulse Rate:  [85-98] 85 (02/27 0910) Resp:  [14-20] 14 (02/27 0910) BP: (111-127)/(57-75) 111/57 (02/27 0452) SpO2:  [93 %-96 %] 95 % (02/27 0910) Weight:  [322 lb 8.5 oz (146.3 kg)] 322 lb 8.5 oz (146.3 kg) (02/27 8937) Physical  Exam: General: awake and alert, laying in bed, tearful  Cardiovascular: RRR, no MRG Respiratory: CTAB, no  wheezes, rales, or rhonchi  Abdomen: soft, non tender, distended, bowel sounds normal  Extremities: LLE wrapped, after dressing removed 2 ulcerations noted. Erythema slightly above marked borders. Increased tenderness to palpation   Laboratory: Recent Labs  Lab 01/30/18 0730 01/31/18 0452 02/01/18 0631  WBC 15.2* 12.3* 10.2  HGB 9.4* 9.0* 8.9*  HCT 28.9* 27.6* 27.4*  PLT 293 288 341   Recent Labs  Lab 01/30/18 0730 01/31/18 0452 02/01/18 0631  NA 130* 132* 136  K 3.8 4.0 3.9  CL 97* 99* 103  CO2 22 21* 22  BUN 35* 41* 29*  CREATININE 1.90* 1.76* 1.22*  CALCIUM 7.6* 7.7* 8.1*  GLUCOSE 168* 228* 96    Ref. Range 01/31/2018 23:26  MRSA, PCR Latest Ref Range: NEGATIVE  NEGATIVE  Staphylococcus aureus Latest Ref Range: NEGATIVE  NEGATIVE   CBG (last 3)  Recent Labs    01/31/18 1838 01/31/18 2130 02/01/18 0802  GLUCAP 223* 178* 94    Ref. Range 02/01/2018 06:31  CK Total Latest Ref Range: 38 - 234 U/L 73    Imaging/Diagnostic Tests: Mr Foot Left Wo Contrast  Result Date: 01/27/2018 CLINICAL DATA:  Worsening ulcer along the lateral base of the fifth MTP joint. EXAM: MRI OF THE LEFT FOOT WITHOUT CONTRAST TECHNIQUE: Multiplanar, multisequence MR imaging of the left forefoot was performed. No intravenous contrast was administered. COMPARISON:  None. FINDINGS: Bones/Joint/Cartilage Soft tissue ulcer along the lateral aspect of the fifth metatarsal head. Mild cortical irregularity and bone marrow edema in the fifth metatarsal head. No other marrow signal abnormality. No fracture or dislocation. Normal alignment. No joint effusion. Mild osteoarthritis of third, fourth and fifth tarsometatarsal joints. Ligaments Collateral ligaments are intact.  Lisfranc joint is intact. Muscles and Tendons Flexor, peroneal and extensor compartment tendons are intact. Muscles are normal. Soft tissue No fluid collection or hematoma. No soft tissue mass. Soft tissue edema along the dorsal aspect of the  foot and Hall severe around the fifth MTP joint. IMPRESSION: Soft tissue ulcer along the lateral aspect of the fifth metatarsal head. Mild cortical irregularity and bone marrow edema in the fifth metatarsal head concerning for osteomyelitis. Electronically Signed   By: Kathreen Devoid   On: 01/27/2018 11:31   Dg Foot Complete Left  Result Date: 01/26/2018 CLINICAL DATA:  Ulcer and pain on the little digit. Evaluate for osteomyelitis. EXAM: LEFT FOOT - COMPLETE 3+ VIEW COMPARISON:  09/26/2017 FINDINGS: Soft tissue irregularity and bandage over the fifth metatarsal head. No evidence of erosion, soft tissue gas, or opaque foreign body. No fracture deformity. Heel ulcer and bandage also noted.  No adjacent acute bony finding. Extensive arterial calcification. Dystrophic soft tissue calcifications in the lower leg. IMPRESSION: No evidence of osteomyelitis. No opaque foreign body or soft tissue gas. Electronically Signed   By: Monte Fantasia M.D.   On: 01/26/2018 18:17     Belinda More, DO 02/01/2018, 9:14 AM PGY-1, [place] Intern pager: [phone], text pages welcome

## 2018-02-02 ENCOUNTER — Encounter (HOSPITAL_COMMUNITY): Payer: Self-pay | Admitting: Orthopedic Surgery

## 2018-02-02 DIAGNOSIS — D72829 Elevated white blood cell count, unspecified: Secondary | ICD-10-CM

## 2018-02-02 LAB — CBC
HCT: 27.2 % — ABNORMAL LOW (ref 36.0–46.0)
Hemoglobin: 9 g/dL — ABNORMAL LOW (ref 12.0–15.0)
MCH: 30 pg (ref 26.0–34.0)
MCHC: 33.1 g/dL (ref 30.0–36.0)
MCV: 90.7 fL (ref 78.0–100.0)
PLATELETS: 335 10*3/uL (ref 150–400)
RBC: 3 MIL/uL — ABNORMAL LOW (ref 3.87–5.11)
RDW: 13.1 % (ref 11.5–15.5)
WBC: 12.8 10*3/uL — ABNORMAL HIGH (ref 4.0–10.5)

## 2018-02-02 LAB — BASIC METABOLIC PANEL
Anion gap: 11 (ref 5–15)
BUN: 33 mg/dL — AB (ref 6–20)
CALCIUM: 7.8 mg/dL — AB (ref 8.9–10.3)
CO2: 21 mmol/L — ABNORMAL LOW (ref 22–32)
Chloride: 101 mmol/L (ref 101–111)
Creatinine, Ser: 1.31 mg/dL — ABNORMAL HIGH (ref 0.44–1.00)
GFR calc non Af Amer: 51 mL/min — ABNORMAL LOW (ref >60)
GFR, EST AFRICAN AMERICAN: 59 mL/min — AB (ref >60)
Glucose, Bld: 262 mg/dL — ABNORMAL HIGH (ref 65–99)
Potassium: 4.3 mmol/L (ref 3.5–5.1)
SODIUM: 133 mmol/L — AB (ref 135–145)

## 2018-02-02 LAB — GLUCOSE, CAPILLARY
GLUCOSE-CAPILLARY: 242 mg/dL — AB (ref 65–99)
GLUCOSE-CAPILLARY: 219 mg/dL — AB (ref 65–99)
Glucose-Capillary: 243 mg/dL — ABNORMAL HIGH (ref 65–99)
Glucose-Capillary: 228 mg/dL — ABNORMAL HIGH (ref 65–99)
Glucose-Capillary: 192 mg/dL — ABNORMAL HIGH (ref 65–99)
Glucose-Capillary: 220 mg/dL — ABNORMAL HIGH (ref 65–99)

## 2018-02-02 MED ORDER — CIPROFLOXACIN HCL 500 MG PO TABS: 500.0000 mg | ORAL_TABLET | Freq: Two times a day (BID) | ORAL | Status: DC

## 2018-02-02 MED ORDER — INSULIN GLARGINE 100 UNIT/ML ~~LOC~~ SOLN
50.0000 [IU] | Freq: Two times a day (BID) | SUBCUTANEOUS | Status: DC
Start: 2018-02-02 — End: 2018-02-03
  Administered 2018-02-02 – 2018-02-03 (×3): 50 [IU] via SUBCUTANEOUS
  Filled 2018-02-02 (×4): qty 0.5

## 2018-02-02 MED ORDER — MORPHINE SULFATE (PF) 4 MG/ML IV SOLN
2.0000 mg | Freq: Four times a day (QID) | INTRAVENOUS | Status: DC | PRN
  Administered 2018-02-02: 2 mg via INTRAVENOUS
  Filled 2018-02-02: qty 1

## 2018-02-02 MED ORDER — CIPROFLOXACIN HCL 500 MG PO TABS
500.0000 mg | ORAL_TABLET | Freq: Two times a day (BID) | ORAL | Status: DC
  Administered 2018-02-02 – 2018-02-03 (×2): 500 mg via ORAL
  Filled 2018-02-02 (×2): qty 1

## 2018-02-02 MED ORDER — MORPHINE SULFATE (PF) 4 MG/ML IV SOLN
2.0000 mg | INTRAVENOUS | Status: DC | PRN
  Administered 2018-02-02: 2 mg via INTRAVENOUS
  Filled 2018-02-02: qty 1

## 2018-02-02 MED ORDER — AMOXICILLIN-POT CLAVULANATE 875-125 MG PO TABS
1.0000 | ORAL_TABLET | Freq: Two times a day (BID) | ORAL | Status: DC
  Administered 2018-02-02 – 2018-02-03 (×3): 1 via ORAL
  Filled 2018-02-02 (×3): qty 1

## 2018-02-02 MED ORDER — DOXYCYCLINE HYCLATE 100 MG PO TABS
100.0000 mg | ORAL_TABLET | Freq: Two times a day (BID) | ORAL | Status: DC
  Administered 2018-02-02: 100 mg via ORAL
  Filled 2018-02-02: qty 1

## 2018-02-02 MED ORDER — HYDROCODONE-ACETAMINOPHEN 10-325 MG PO TABS
1.0000 | ORAL_TABLET | ORAL | Status: DC | PRN
Start: 2018-02-02 — End: 2018-02-02

## 2018-02-02 MED ORDER — INSULIN ASPART 100 UNIT/ML ~~LOC~~ SOLN
10.0000 [IU] | Freq: Three times a day (TID) | SUBCUTANEOUS | Status: DC
  Administered 2018-02-02 – 2018-02-03 (×2): 10 [IU] via SUBCUTANEOUS

## 2018-02-02 MED ORDER — HYDROCODONE-ACETAMINOPHEN 10-325 MG PO TABS
1.0000 | ORAL_TABLET | ORAL | Status: DC | PRN
  Administered 2018-02-02 – 2018-02-03 (×3): 1 via ORAL
  Filled 2018-02-02 (×3): qty 1

## 2018-02-02 NOTE — Progress Notes (Signed)
Physical Therapy Treatment Patient Details Name: [name] MRN: [id number] DOB: [birth year] Today's Date: 02/02/2018    History of Present Illness 42 y.o. female s/p Left 4th and 5th ray amputation due to osteomyelitis. PMH includes: DM, Anemia, AKI, COPD, Cellulitis, morbid obesity, HTN, HLD, Pneumonia, Bipolar 2 disorder, Peripheral neuropathy    PT Comments    Patient progressing to in room ambulation this session, but remains limited due to R LE fatigue.  Patient was unable to negotiate 1 4" step today and concern for home entry at d/c.  Considering ambulance transport and RN CM made aware.  She will need w/c for home as well.  PT to follow.    Follow Up Recommendations  Home health PT;Supervision/Assistance - 24 hour     Equipment Recommendations  Rolling walker with 5" wheels;3in1 (PT);Wheelchair cushion (measurements PT);Wheelchair (measurements PT)(bariatric walker)    Recommendations for Other Services       Precautions / Restrictions Precautions Precautions: Fall Restrictions LLE Weight Bearing: Non weight bearing    Mobility  Bed Mobility Overal bed mobility: Needs Assistance Bed Mobility: Sidelying to Sit   Sidelying to sit: Supervision       General bed mobility comments: use of railing  Transfers Overall transfer level: Needs assistance   Transfers: Stand Pivot Transfers;Sit to/from Stand Sit to Stand: Min assist;Mod assist Stand pivot transfers: Min assist       General transfer comment: from EOB min A for balance and pivot to BSC reaching with both hands to armrests and pivoting sitting on side of seat; from recliner mod A for lifting from seat to stand  Ambulation/Gait Ambulation/Gait assistance: Min guard Ambulation Distance (Feet): 10 Feet(x 2) Assistive device: Rolling walker (2 wheeled) Gait Pattern/deviations: Step-to pattern     General Gait Details: hopped till leg tired and needed to sit; attempted with bari rollator, but pt afraid of  it rolling out too far   Stairs Stairs: Yes   Stair Management: With walker Number of Stairs: 1 General stair comments: unable to hop up one 4" step after demonstration and with PT and spouse assist, pt fearful, discussed possible ambulance transport home  Wheelchair Mobility    Modified Rankin (Stroke Patients Only)       Balance Overall balance assessment: Needs assistance   Sitting balance-Leahy Scale: Good     Standing balance support: Bilateral upper extremity supported Standing balance-Leahy Scale: Poor Standing balance comment: UE support for standing                            Cognition Arousal/Alertness: Awake/alert Behavior During Therapy: WFL for tasks assessed/performed Overall Cognitive Status: Within Functional Limits for tasks assessed                                        Exercises      General Comments        Pertinent Vitals/Pain Pain Assessment: Faces Faces Pain Scale: Hurts even more Pain Location: L foot Pain Descriptors / Indicators: Throbbing;Constant Pain Intervention(s): Monitored during session;Repositioned    Home Living                      Prior Function            PT Goals (current goals can now be found in the care plan section) Progress towards PT goals:  Progressing toward goals    Frequency           PT Plan Current plan remains appropriate    Co-evaluation              AM-PAC PT "6 Clicks" Daily Activity  Outcome Measure  Difficulty turning over in bed (including adjusting bedclothes, sheets and blankets)?: A Little Difficulty moving from lying on back to sitting on the side of the bed? : A Little Difficulty sitting down on and standing up from a chair with arms (e.g., wheelchair, bedside commode, etc,.)?: Unable Help needed moving to and from a bed to chair (including a wheelchair)?: A Lot Help needed walking in hospital room?: A Little Help needed climbing 3-5  steps with a railing? : Total 6 Click Score: 13    End of Session Equipment Utilized During Treatment: Gait belt Activity Tolerance: Patient limited by fatigue Patient left: with call bell/phone within reach;in chair;with family/visitor present         Time: 5320-2334 PT Time Calculation (min) (ACUTE ONLY): 34 min  Charges:  $Gait Training: 23-37 mins                    G CodesMagda Hall, [name] 02/02/2018    [name] 02/02/2018, 4:46 PM

## 2018-02-02 NOTE — Progress Notes (Addendum)
Family Medicine Teaching Service Daily Progress Note Intern Pager: [phone]  Patient name: [name] Medical record number: [id number] Date of birth: 02/08/1976 Age: 42 y.o. Gender: female  Primary Care Provider: [name], MD Consultants: Orthopedic surgery, ID  Code Status: Full  Pt Overview and Major Events to Date:  Admitted to [facility] on 01/27/18  Assessment and Plan: Belinda Banksis a 42 y.o.femalepresenting with L foot ulcer. PMH is significant foruncontrolled T2DM, COPD, HLD, HTN, OSA, bipolar disorder.  02/27 - FOURTH AND 5TH RAY AMPUTATION  Llateral base 5th MTPfootdiabetic foot ulcerwith osteomyelitiss/p amputation Patient fell once o/n while trying to ambulate to there restroom w/o assistance. Likely unsteady after surgery. Pt feels better this am. Mild abration on knee, no edema.  ID said appropriate to switch to oral abx. Will stop daptomycin and cefepime and start augmentin and doxycycline. Pain well controlled on Norco. Will monitor today - Augmentin and Doxycycline - norco 10 mg-325mg  q4h prn, morphine for breakthrough pain - PT consult: HHPT rolling walker and 3:1 - NWB status on LLE  Type 2 DM with diabetic neuropathy - restart lantus to 50 units -rSSI, with addednighttime coverage -5U mealtime coverage, likely need to increase to 10 units, will monitor after eating - monitor CBGs closely  - continue gabapentin 1200mg  tid  Anemia Hg 9. Stable.  - cont to monitor   AKI - stable 1.22 > 1.31. -monitor on daily BMP  HTN Chronic, stable. Normotensive  - hold home lisinopril given hypotension during admission  -continue home coreg  COPD Stable.On symbicort 2 puffs BID with prn albuterol - dulera while admitted - prn albuterol   FEN/GI:carb modified diet,protonix, NPO @midnight  Prophylaxis:SCDs  Disposition: inpatient while transitioning to PO abx, likely home tomorrow  Subjective:  Feels better. Pain is well  controlled. Pt denies any dizzienss. Endorses some SOB, but this is baseline and does not feel worse than normal.   Objective: Temp:  [97.2 F (36.2 C)-98.8 F (37.1 C)] 97.8 F (36.6 C) (02/28 0700) Pulse Rate:  [82-95] 91 (02/28 0700) Resp:  [12-20] 18 (02/28 0700) BP: (107-144)/(62-94) 122/66 (02/28 0700) SpO2:  [90 %-100 %] 99 % (02/28 0700) Physical Exam: General: awake and alert, sitting up in bed, NAD, morbid obesity Cardiovascular: RRR, no MRG Respiratory: CTAB, no wheezes, rales, or rhonchi  Abdomen: soft, non tender, distended, bowel sounds normal  Extremities: LLE wrapped, mild blood tinge on dressing, but appears dried.  Laboratory: Recent Labs  Lab 02/01/18 0631 02/01/18 1623 02/02/18 0537  WBC 10.2 10.2 12.8*  HGB 8.9* 9.1* 9.0*  HCT 27.4* 28.9* 27.2*  PLT 341 332 335   Recent Labs  Lab 01/31/18 0452 02/01/18 0631 02/02/18 0537  NA 132* 136 133*  K 4.0 3.9 4.3  CL 99* 103 101  CO2 21* 22 21*  BUN 41* 29* 33*  CREATININE 1.76* 1.22* 1.31*  CALCIUM 7.7* 8.1* 7.8*  GLUCOSE 228* 96 262*    Ref. Range 01/31/2018 23:26  MRSA, PCR Latest Ref Range: NEGATIVE  NEGATIVE  Staphylococcus aureus Latest Ref Range: NEGATIVE  NEGATIVE   CBG (last 3)  Recent Labs    02/01/18 2134 02/02/18 0637 02/02/18 0837  GLUCAP 339* 242* 219*    Ref. Range 02/01/2018 06:31  CK Total Latest Ref Range: 38 - 234 U/L 73    Imaging/Diagnostic Tests: Mr Foot Left Wo Contrast  Result Date: 01/27/2018 CLINICAL DATA:  Worsening ulcer along the lateral base of the fifth MTP joint. EXAM: MRI OF THE LEFT FOOT WITHOUT  CONTRAST TECHNIQUE: Multiplanar, multisequence MR imaging of the left forefoot was performed. No intravenous contrast was administered. COMPARISON:  None. FINDINGS: Bones/Joint/Cartilage Soft tissue ulcer along the lateral aspect of the fifth metatarsal head. Mild cortical irregularity and bone marrow edema in the fifth metatarsal head. No other marrow signal abnormality.  No fracture or dislocation. Normal alignment. No joint effusion. Mild osteoarthritis of third, fourth and fifth tarsometatarsal joints. Ligaments Collateral ligaments are intact.  Lisfranc joint is intact. Muscles and Tendons Flexor, peroneal and extensor compartment tendons are intact. Muscles are normal. Soft tissue No fluid collection or hematoma. No soft tissue mass. Soft tissue edema along the dorsal aspect of the foot and more severe around the fifth MTP joint. IMPRESSION: Soft tissue ulcer along the lateral aspect of the fifth metatarsal head. Mild cortical irregularity and bone marrow edema in the fifth metatarsal head concerning for osteomyelitis. Electronically Signed   By: Kathreen Devoid   On: 01/27/2018 11:31   Dg Foot Complete Left  Result Date: 01/26/2018 CLINICAL DATA:  Ulcer and pain on the little digit. Evaluate for osteomyelitis. EXAM: LEFT FOOT - COMPLETE 3+ VIEW COMPARISON:  09/26/2017 FINDINGS: Soft tissue irregularity and bandage over the fifth metatarsal head. No evidence of erosion, soft tissue gas, or opaque foreign body. No fracture deformity. Heel ulcer and bandage also noted.  No adjacent acute bony finding. Extensive arterial calcification. Dystrophic soft tissue calcifications in the lower leg. IMPRESSION: No evidence of osteomyelitis. No opaque foreign body or soft tissue gas. Electronically Signed   By: Monte Fantasia M.D.   On: 01/26/2018 18:17     Bonnita Hollow, MD 02/02/2018, 9:48 AM PGY-1, [facility] Intern pager: [phone], text pages welcome

## 2018-02-02 NOTE — Care Management Note (Signed)
Case Management Note  Patient Details  Name: [name] MRN: [id number] Date of Birth: October 29, 1976  Subjective/Objective:                    Action/Plan:  PT recommending HHPT, waler and 3 in 1 ( bariatric).   Spoke to patient and spouse at bedside. Patient agreeable to HHPT, already has a 3 in1, needs a walker.  Ordered Bariatric walker which will be delivered to room before discharge. Text paged family medicine pager for order and face to face for HHPT Expected Discharge Date:                  Expected Discharge Plan:  [place]  In-House Referral:     Discharge planning Services  CM Consult  Post Acute Care Choice:  Durable Medical Equipment, Home Health Choice offered to:  Patient, Spouse  DME Arranged:  Walker rolling DME Agency:  [place]:  PT [facility] Agency:  [place]  Status of Service:  In process, will continue to follow  If discussed at Long Length of Stay Meetings, dates discussed:    Additional Comments:  Marilu Favre, RN 02/02/2018, 1:21 PM

## 2018-02-02 NOTE — Progress Notes (Signed)
Spoke with Janene Madeira, NP with ID regarding pt abx. They recommended changing doxycycline to ciprofloxacin due to pt having no known MRSA history and wanting to include Serratia coverage. Will modify antibiotics to reflect this.

## 2018-02-02 NOTE — Progress Notes (Signed)
[facility] for Infectious Disease  Date of Admission:  01/27/2018     Total days of antibiotics 7  Daptomycin   Cefepime          Patient ID: [name] is a 42 y.o. F with acute osteomyelitis of the left 5th ray/metatarsal head now s/p day 0 for 4th and 5th ray amputation  Principal Problem:   Acute osteomyelitis of ankle or foot, left ([name]) Active Problems:   Diabetes mellitus ([place])   Subacute osteomyelitis, left ankle and foot ([place])   Anemia   AKI (acute kidney injury) ([place])   . amoxicillin-clavulanate  1 tablet Oral Q12H  . atorvastatin  20 mg Oral q1800  . carvedilol  3.125 mg Oral BID WC  . doxycycline  100 mg Oral Q12H  . gabapentin  1,200 mg Oral TID  . insulin aspart  0-20 Units Subcutaneous TID WC  . insulin aspart  0-5 Units Subcutaneous QHS  . insulin aspart  10 Units Subcutaneous TID WC  . insulin glargine  50 Units Subcutaneous BID  . mometasone-formoterol  2 puff Inhalation BID  . pantoprazole  40 mg Oral Daily    SUBJECTIVE: Tired from sleep interruption. Pain is present but able to move toes well. Noted for fall last night. No fevers, chills, rashes or diarrhea.   Allergies  Allergen Reactions  . Kiwi Extract Shortness Of Breath and Swelling  . Nubain [Nalbuphine Hcl] Other (See Comments)    "FEELS LIKE SOMETHING CRAWLING ON ME"    OBJECTIVE: Vitals:   02/02/18 0004 02/02/18 0647 02/02/18 0700 02/02/18 1100  BP:  113/75 122/66 116/62  Pulse: 91 83 91 87  Resp: 18 18 18 18   Temp:  97.8 F (36.6 C) 97.8 F (36.6 C) 98 F (36.7 C)  TempSrc:  Oral Oral Oral  SpO2: 97% 97% 99% 94%  Weight:      Height:       Body mass index is 58.99 kg/m.  Physical Exam  Constitutional: She is oriented to person, place, and time and well-developed, well-nourished, and in no distress.  HENT:  Mouth/Throat: No oral lesions. No dental abscesses.  Cardiovascular: Normal rate, regular rhythm and normal heart sounds.  Pulmonary/Chest: Effort  normal. No respiratory distress. She has wheezes.  Abdominal: Soft. She exhibits no distension. There is no tenderness.  Musculoskeletal: Normal range of motion. She exhibits no tenderness.  Surgical dressing in place s/p 4-5 ray amputation. No swelling observed.   Neurological: She is alert and oriented to person, place, and time.  Skin: Skin is warm and dry. No rash noted.  Psychiatric: Mood, affect and judgment normal.  Vitals reviewed.   Lab Results Lab Results  Component Value Date   WBC 12.8 (H) 02/02/2018   HGB 9.0 (L) 02/02/2018   HCT 27.2 (L) 02/02/2018   MCV 90.7 02/02/2018   PLT 335 02/02/2018    Lab Results  Component Value Date   CREATININE 1.31 (H) 02/02/2018   BUN 33 (H) 02/02/2018   NA 133 (L) 02/02/2018   K 4.3 02/02/2018   CL 101 02/02/2018   CO2 21 (L) 02/02/2018    Lab Results  Component Value Date   ALT 14 10/30/2017   AST 11 (L) 10/30/2017   ALKPHOS 102 10/30/2017   BILITOT 0.4 10/30/2017    Lab Results  Component Value Date   ESRSEDRATE 42 (H) 10/29/2017   Lab Results  Component Value Date   CRP 2.3 (H)  10/29/2017     Microbiology: BCx 2/21 >> NG  BCx 2/24 1 set > NG    ASSESSMENT: 42 y.o. obese F with poorly controlled diabetes now with acute on chronic osteomyelitis / cellulitis of the left #5 ray/MTP head. Possible involvment of the 4th digit as well. She has failed medical therapy with several courses of prolonged antibiotics by mouth and now with second hospitalization due to her diabetic foot infection. With antibiotic use history raises concern regarding MDR organisms playing a role.   PLAN: Diabetic Foot Infection / Osteomyelitis=  S/P curative amputation and debridement. Would treat with 3 more days of PO amox-clav + cipro to cover previously recovered serratia. No history of MRSA and PCR negative for this so less at risk.   Follow up with orthopedic team.   Janene Madeira, MSN, NP-C [facility] for Infectious  [place] Cell: [phone] Pager: [phone]  02/02/2018  3:00 PM

## 2018-02-02 NOTE — Progress Notes (Signed)
Patient ID: [name], female   DOB: [birth year], 42 y.o.   MRN: [id number] Postoperative day 1 left fourth and fifth ray amputations.  Would continue IV antibiotics for 3 days postoperatively she did have extensive soft tissue abscess this was all debrided and no residual tissue involved with the infection remain.  The importance of nonweightbearing on the left was discussed.  Patient states she fell last night.  The dressing has a very small drop of blood but no signs of any significant bleeding.

## 2018-02-02 NOTE — Progress Notes (Addendum)
Family Medicine Teaching Service Daily Progress Note Intern Pager: [phone]  Patient name: [name] Medical record number: [id number] Date of birth: 1976/02/15 Age: 42 y.o. Gender: female  Primary Care Provider: [name], MD Consultants: Orthopedic surgery, ID Code Status: Full   Pt Overview and Major Events to Date:  01/27/18 - Admitted to [facility] 02/01/18 - Fourth and fifth ray amputation   Assessment and Plan: Belinda Banksis a 42 y.o.femalepresenting with L foot ulcer. PMH is significant foruncontrolled T2DM, COPD, HLD, HTN, OSA, bipolar disorder.  Llateral base 5th MTPfootdiabetic foot ulcerwith osteomyelitis POD#2: s/p 4th and 5th ray amputation. Pt feels better this am. Per ID patient should switch to PO antibiotics (augmentin + cipro) for 3 days (end on 3/2). S/p IV vanc/zosyn from 2/22-2/25 and IV daptomycin/cefepime from 2/26-2/28. Pain well controlled on Norco. Blood cultures NGTD. Will monitor today. Patient had fall yesterday but did not obtain injury.  -continue Augmentin (2/28-) and ciprofloxacin (2/28-) -norco 10 mg-325mg  q4h prn, morphine for breakthrough pain (has not used morphine since 2/28 @2347 ) -PT consult: HHPT rolling walker and 3:1. NWB status on LLE -orthopedics consulted, appreciate recommendations: recommend elevating foot and strict non-weigthbearing. Keep dressings for 1-2 weeks.  -ID consulted, appreciated recommendations. Have signed off   Type 2 DM with diabetic neuropathy Poorly controlled. CBGs ranging from 145-228 in 24hrs. Home meds: Tresiba 106U daily withnovolog15U at lunchtime and 25U with supper, victoza 0.70mL daily. -continue lantus to 50 units -rSSI, with addednighttime coverage -10U mealtime coverage  -monitor CBGs closely  -continue gabapentin 1200mg  tid -on discharge: continue victoza, tresiba 80U daily, novolog 15U at lunchtime and 25U with supper   Anemia Stable. Hg 8.5 on 3/1, 9 on 2/28. Likely multifactorial  given recent surgery and previous anemia of chronic disease.  -continue to monitor -if continues to be low at follow up can consider outpatient iron supplementation    AKI - improving  Improving. 2/2 vancomycin. Creatinine trended during admission of 0.48>0.75>1.83>1.90>1.76>1.22>1.31>1.27 -monitor on daily BMP  HTN Chronic, stable. Hypertensive this morning with BP of 158/79, however this occurred after patient fell out of bed. Home meds: coreg 3.125mg  BID, lisinopril 5mg  qd -hold home lisinoprilgivenhypotension during admission -continue home coreg -have informed RN to re-check BP prior to discharge   COPD Stable.On symbicort 2 puffs BID with prn albuterol -dulera while admitted -prn albuterol  OA Chronic, stable.Chronic norco 10-325mg  q8 for pain relief with flexeril prn. -continue home norco  HLD Home meds: atorvastatin 20mg  daily  -continue home atorvastatin  FEN/GI:carb modified diet,protonix, NPO @midnight  Prophylaxis:SCDs  Disposition: will likely dc home with home health   Subjective:  Patient states she feels well. In good spirits. States no pain. Denies SOB, denies CP. Wants to go home as it is her birthday. Reports some wheezing early this morning but has resolved.   Objective: Temp:  [98 F (36.7 C)-98.4 F (36.9 C)] 98.4 F (36.9 C) (02/28 1922) Pulse Rate:  [87-95] 95 (03/01 0444) Resp:  [18] 18 (03/01 0444) BP: (116-158)/(62-79) 158/79 (03/01 0444) SpO2:  [94 %-99 %] 99 % (03/01 0444) Physical Exam: General: awake and alert, laying in bed, NAD, in good spirits  Cardiovascular: RRR, no MRG Respiratory: CTAB, no wheezes, rales or rhonchi  Abdomen: soft, non tender, non distended, bowel sounds normal  Extremities: non tender, no edema, LLE with dressing placed   Laboratory: Recent Labs  Lab 02/01/18 1623 02/02/18 0537 02/03/18 0528  WBC 10.2 12.8* 12.3*  HGB 9.1* 9.0* 8.5*  HCT 28.9* 27.2* 27.2*  PLT 332 335 388   Recent Labs   Lab 02/01/18 0631 02/02/18 0537 02/03/18 0528  NA 136 133* 136  K 3.9 4.3 4.3  CL 103 101 102  CO2 22 21* 23  BUN 29* 33* 31*  CREATININE 1.22* 1.31* 1.27*  CALCIUM 8.1* 7.8* 8.3*  GLUCOSE 96 262* 167*    Imaging/Diagnostic Tests: Mr Foot Left Wo Contrast  Result Date: 01/27/2018 CLINICAL DATA:  Worsening ulcer along the lateral base of the fifth MTP joint. EXAM: MRI OF THE LEFT FOOT WITHOUT CONTRAST TECHNIQUE: Multiplanar, multisequence MR imaging of the left forefoot was performed. No intravenous contrast was administered. COMPARISON:  None. FINDINGS: Bones/Joint/Cartilage Soft tissue ulcer along the lateral aspect of the fifth metatarsal head. Mild cortical irregularity and bone marrow edema in the fifth metatarsal head. No other marrow signal abnormality. No fracture or dislocation. Normal alignment. No joint effusion. Mild osteoarthritis of third, fourth and fifth tarsometatarsal joints. Ligaments Collateral ligaments are intact.  Lisfranc joint is intact. Muscles and Tendons Flexor, peroneal and extensor compartment tendons are intact. Muscles are normal. Soft tissue No fluid collection or hematoma. No soft tissue mass. Soft tissue edema along the dorsal aspect of the foot and more severe around the fifth MTP joint. IMPRESSION: Soft tissue ulcer along the lateral aspect of the fifth metatarsal head. Mild cortical irregularity and bone marrow edema in the fifth metatarsal head concerning for osteomyelitis. Electronically Signed   By: Kathreen Devoid   On: 01/27/2018 11:31   Dg Foot Complete Left  Result Date: 01/26/2018 CLINICAL DATA:  Ulcer and pain on the little digit. Evaluate for osteomyelitis. EXAM: LEFT FOOT - COMPLETE 3+ VIEW COMPARISON:  09/26/2017 FINDINGS: Soft tissue irregularity and bandage over the fifth metatarsal head. No evidence of erosion, soft tissue gas, or opaque foreign body. No fracture deformity. Heel ulcer and bandage also noted.  No adjacent acute bony finding.  Extensive arterial calcification. Dystrophic soft tissue calcifications in the lower leg. IMPRESSION: No evidence of osteomyelitis. No opaque foreign body or soft tissue gas. Electronically Signed   By: Monte Fantasia M.D.   On: 01/26/2018 18:17    [name], DO 02/03/2018, 10:53 AM PGY-1, [place] Intern pager: [phone], text pages welcome

## 2018-02-02 NOTE — Assessment & Plan Note (Signed)
Patient has ABLA from recent surgery on top of her Anemia of Chronic Disease.

## 2018-02-02 NOTE — Progress Notes (Signed)
Patient has home machine and will place self on when ready. Water chamber filled for patient. RT will continue to monitor.

## 2018-02-02 NOTE — Care Management (Signed)
    Durable Medical Equipment  (From admission, onward)        Start     Ordered   02/02/18 1630  For home use only DME Walker rolling  Once    Comments:  Bariatric wt 322 lbs, ht 5'2"  Question:  Patient needs a walker to treat with the following condition  Answer:  Diabetic foot ulcer with osteomyelitis ([place])   02/02/18 1631   02/02/18 1624  For home use only DME high strength lightweight manual wheelchair with seat cushion  Once    Comments:  Patient suffers from L lateral base 5th MTP foot diabetic foot ulcer with osteomyelitis s/p amputation  which impairs their ability to perform daily activities like ambulating  in the home.  A cane will not resolve  issue with performing activities of daily living. A wheelchair will allow patient to safely perform daily activities.   Accessories: elevating leg rests (ELRs), wheel locks, extensions and anti-tippers.   20 x 18 HD Bariatric wt 322 lbs, ht 5'2"   02/02/18 1629   02/02/18 1618  For home use only DME 3 n 1  Once    Comments:  Bariatric   02/02/18 1617   02/02/18 1320  For home use only DME Walker rolling  Once    Comments:  Bariatric wt 322 lbs, ht 5'2"  Question:  Patient needs a walker to treat with the following condition  Answer:  Diabetic foot ulcer with osteomyelitis ([place])   02/02/18 1320

## 2018-02-03 ENCOUNTER — Telehealth: Payer: Self-pay | Admitting: Family Medicine

## 2018-02-03 DIAGNOSIS — E0810 Diabetes mellitus due to underlying condition with ketoacidosis without coma: Secondary | ICD-10-CM

## 2018-02-03 LAB — BASIC METABOLIC PANEL
Anion gap: 11 (ref 5–15)
BUN: 31 mg/dL — AB (ref 6–20)
CHLORIDE: 102 mmol/L (ref 101–111)
CO2: 23 mmol/L (ref 22–32)
CREATININE: 1.27 mg/dL — AB (ref 0.44–1.00)
Calcium: 8.3 mg/dL — ABNORMAL LOW (ref 8.9–10.3)
GFR calc Af Amer: >60 mL/min (ref >60)
GFR calc non Af Amer: 52 mL/min — ABNORMAL LOW (ref >60)
GLUCOSE: 167 mg/dL — AB (ref 65–99)
Potassium: 4.3 mmol/L (ref 3.5–5.1)
Sodium: 136 mmol/L (ref 135–145)

## 2018-02-03 LAB — CBC
HEMATOCRIT: 27.2 % — AB (ref 36.0–46.0)
Hemoglobin: 8.5 g/dL — ABNORMAL LOW (ref 12.0–15.0)
MCH: 28.9 pg (ref 26.0–34.0)
MCHC: 31.3 g/dL (ref 30.0–36.0)
MCV: 92.5 fL (ref 78.0–100.0)
PLATELETS: 388 10*3/uL (ref 150–400)
RBC: 2.94 MIL/uL — ABNORMAL LOW (ref 3.87–5.11)
RDW: 13.2 % (ref 11.5–15.5)
WBC: 12.3 10*3/uL — AB (ref 4.0–10.5)

## 2018-02-03 LAB — CULTURE, BLOOD (ROUTINE X 2)
Culture: NO GROWTH
SPECIAL REQUESTS: ADEQUATE

## 2018-02-03 LAB — GLUCOSE, CAPILLARY
Glucose-Capillary: 145 mg/dL — ABNORMAL HIGH (ref 65–99)
Glucose-Capillary: 165 mg/dL — ABNORMAL HIGH (ref 65–99)

## 2018-02-03 MED ORDER — IBUPROFEN 400 MG PO TABS
400.0000 mg | ORAL_TABLET | Freq: Once | ORAL | Status: AC | PRN
  Administered 2018-02-03: 400 mg via ORAL
  Filled 2018-02-03: qty 1

## 2018-02-03 MED ORDER — AMOXICILLIN-POT CLAVULANATE 875-125 MG PO TABS: 1.0000 | ORAL_TABLET | Freq: Two times a day (BID) | ORAL | 0 refills | Status: DC

## 2018-02-03 MED ORDER — CIPROFLOXACIN HCL 500 MG PO TABS: 500.0000 mg | ORAL_TABLET | Freq: Two times a day (BID) | ORAL | 0 refills | Status: DC

## 2018-02-03 MED ORDER — INSULIN DEGLUDEC 200 UNIT/ML ~~LOC~~ SOPN: 80.0000 [IU] | PEN_INJECTOR | Freq: Every day | SUBCUTANEOUS | 0 refills | Status: DC

## 2018-02-03 MED ORDER — POLYETHYLENE GLYCOL 3350 17 G PO PACK: 17.0000 g | PACK | Freq: Every day | ORAL | 0 refills | Status: DC | PRN

## 2018-02-03 MED ORDER — INSULIN ASPART 100 UNIT/ML ~~LOC~~ SOLN: 15.0000 [IU] | Freq: Every day | SUBCUTANEOUS | 0 refills | Status: DC

## 2018-02-03 NOTE — Progress Notes (Signed)
Patient ID: [name], female   DOB: 17-Apr-1976, 42 y.o.   MRN: [id number] Status post left fourth and fifth ray amputations.  Dressing is clean and dry patient is comfortable this morning anticipate discharge to home patient is to keep her foot elevated strict nonweightbearing keep the dressing dry follow-up in the office in 1-2 weeks to change the dressing.

## 2018-02-03 NOTE — Care Management Note (Addendum)
Case Management Note  Patient Details  Name: [name] MRN: [id number] Date of Birth: Aug 07, 1976  Subjective/Objective:                    Action/Plan: Patient wants walker delivered to hospital room today and husband will take it home. Patient wants wheelchair delivered to home.   Patient requesting ambulance transport home . Confirmed face sheet address with patient . Patient wants walker and wheel chair delivered to home, patient husband is at home( confirmed phone number).   PTAR paperwork ready in shadow chart. Once MD, nurse and patient ready for discharge will call. Expected Discharge Date:                  Expected Discharge Plan:  [place]  In-House Referral:     Discharge planning Services  CM Consult  Post Acute Care Choice:  Durable Medical Equipment, Home Health Choice offered to:  Patient, Spouse  DME Arranged:  Walker rolling, Youth worker wheelchair with seat cushion DME Agency:  [place]:  PT [place]:  [place]  Status of Service:  In process, will continue to follow  If discussed at Long Length of Stay Meetings, dates discussed:    Additional Comments:  Marilu Favre, RN 02/03/2018, 10:11 AM

## 2018-02-03 NOTE — Progress Notes (Signed)
Discharge instructions given to pt. Verbalized understanding. Left foot dressing dry and intact. Discharged to home transported by [facility]. New walker w/ pt to bring home.

## 2018-02-03 NOTE — Progress Notes (Signed)
   02/03/18 0458  What Happened  Was fall witnessed? No  Was patient injured? No  Patient found on floor  Found by Staff-comment  Stated prior activity other (comment) (pt attempted to roll over in bed, rolled herself out of bed)  Follow Up  MD notified [name]  Time MD notified [phone]  Family notified No- patient refusal (Pt stated she would call her husband herself)  Adult Fall Risk Assessment  Risk Factor Category (scoring not indicated) Fall has occurred during this admission (document High fall risk)  Patient's Fall Risk High Fall Risk (>13 points)  Adult Fall Risk Interventions  Required Bundle Interventions *See Row Information* High fall risk - low, moderate, and high requirements implemented  Additional Interventions Use of appropriate toileting equipment (bedpan, BSC, etc.)  Screening for Fall Injury Risk  Risk For Fall Injury- See Row Information  None identified  Pain Assessment  Pain Assessment No/denies pain  Neurological  Neuro (WDL) WDL  Level of Consciousness Alert  Orientation Level Oriented X4  Cognition Appropriate at baseline;Follows commands;No memory impairment  Speech Clear  Pupil Assessment  No  RUE Sensation Full sensation  LUE Sensation Full sensation  RLE Sensation Full sensation  LLE Motor Response Purposeful movement  LLE Sensation Full sensation  Musculoskeletal  Musculoskeletal (WDL) X  Assistive Device BSC (Simultaneous filing. User may not have seen previous data.)  Generalized Weakness Yes  Weight Bearing Restrictions Yes  LLE Weight Bearing NWB  Musculoskeletal Details  LLE Ortho/Supportive Device;Limited movement;Swelling;Amputated toes;Surgery  LLE Ortho/Supportive Device Ace wrap  Integumentary  Integumentary (WDL) X  Skin Color Appropriate for ethnicity  Skin Condition Dry  Skin Integrity Surgical Incision (see LDA)  Cellulitis Location Foot  Cellulitis Location Orientation Left  Skin Turgor Non-tenting

## 2018-02-03 NOTE — Progress Notes (Signed)
FPTS Interim Progress Note  S:Paged by RN that patient fell OOB. Patient was turning in bed and rolled off the bed. Left side rail was not raised and patient went to reach for side rail to roll over and then fell. Patient denies any injury and reports she did not hit her head. Patient reports no pain.   O: BP (!) 158/79 (BP Location: Right Arm)   Pulse 95   Temp 98.4 F (36.9 C) (Oral)   Resp 18   Ht 5\' 2"  (1.575 m)   Wt (!) 322 lb 8.5 oz (146.3 kg)   LMP 01/06/2018 (Approximate)   SpO2 99%   BMI 58.99 kg/m   Neuro: awake, alert and oriented. CN2-12 grossly intact, no focal deficits, sensation intact bilaterally, 5/5 muscle strength bilaterally in upper extremities and RLE, 4/5 muscle strength in LLE but limited due to cast, normal grip strength   Ext: cast intact, slight bleeding noted on lateral side but per nursing this is unchanged from earlier today, no active bleeding noted, non tender, no edema  Skin: no bruising or bleeding noted, skin intact   A/P: Fall Neuro exam wnl. No concern for intracranial hemorrhage or injury as patient is neurologically intact and not on any blood thinners at this time. No bruising or skin abrasions noted. No concern for fracture as patient had adequate muscle strength and was non tender to palpation in all extremities.   -will monitor closely  -fall precautions -instructed to call for assistance when getting up, up with assistance order already placed on 01/27/18  [name], DO 02/03/2018, 5:20 AM PGY-1, [place] Medicine Service pager [phone]

## 2018-02-03 NOTE — Progress Notes (Addendum)
Patient called for bathroom. Staff was on the way to patient room for assistance. When staff reached the room, patient was found on the floor on her knees on a pillow at the bedside. Patient stated she attempted roll over in bed and accidentally rolled herself out of bed. Patient was assisted back into the bed by staff. Both knees do not appear reddened or swollen; skin intact. Left foot incision and dressing do not appear changed; dressing is dry and intact with no new drainage. Patient states she is fine; no complaints of additional pain. Family Medicine Teaching Service notified via text page; awaiting response. Asked patient should I notify her husband of the fall, patient refused and stated she would notify him herself. Bed alarm on, lower side rail on bed up, call bell within patient's reach.

## 2018-02-03 NOTE — Discharge Instructions (Signed)
You were admitted for osteomyelitis. You had an amputation on 02/01/18. You did well on oral antibiotics (augmentin and ciprofloxacin) and will continue these medications through 3/2. Please follow up with Dr. Sharol Given in 1 week for dressing change. Please elevate your foot and have strict non-weightbearing.   For your diabetes it is important that your sugars stay between 100-200 for good wound healing. Your new diabetes regimen will be: continue your home victoza as you normally take, Tresiba 80Units daily, and novolog 15Units at lunch and 25Units at dinner. If your sugars are either <100 or >200 please call your PCP or talk to Dr. Valentina Lucks. It will be helpful to keep a log of your sugars and bring them to your follow up appointment.   Please follow up with Dr. Kris Mouton on 02/08/18 @3 :05pm (please arrive 31min early)  Please go to the emergency room if you have low sugars or further signs of infection in your let.

## 2018-02-03 NOTE — Progress Notes (Signed)
Physical Therapy Treatment Patient Details Name: [name] MRN: [id number] DOB: 1976/02/05 Today's Date: 02/03/2018    History of Present Illness 42 y.o. female s/p Left 4th and 5th ray amputation due to osteomyelitis. PMH includes: DM, Anemia, AKI, COPD, Cellulitis, morbid obesity, HTN, HLD, Pneumonia, Bipolar 2 disorder, Peripheral neuropathy    PT Comments    Patient demonstrated decreased activity tolerance and able to ambulate ~41ft total with seated rest break. Pt will continue to benefit from further skilled PT services to maximize independence and safety with mobility.    Follow Up Recommendations  Home health PT;Supervision/Assistance - 24 hour     Equipment Recommendations  Rolling walker with 5" wheels;3in1 (PT);Wheelchair cushion (measurements PT);Wheelchair (measurements PT)(bariatric walker)    Recommendations for Other Services       Precautions / Restrictions Precautions Precautions: Fall Restrictions Weight Bearing Restrictions: Yes LLE Weight Bearing: Non weight bearing    Mobility  Bed Mobility               General bed mobility comments: pt OOB in chair upon arrival  Transfers Overall transfer level: Needs assistance Equipment used: (bariatric RW) Transfers: Sit to/from Stand Sit to Stand: Min assist;Min guard         General transfer comment: pt stood X2 from recliner; first trial pt required min A to power up into standing and then min guard for second trial; cues for hand placement and safe use of AD to stand  Ambulation/Gait Ambulation/Gait assistance: Min guard;Min assist;+2 safety/equipment Ambulation Distance (Feet): (85ft then 34ft) Assistive device: Rolling walker (2 wheeled) Gait Pattern/deviations: Step-to pattern(bariatric)     General Gait Details: cues for NWB status L LE, sequencing and posture; pt fatigued quickly and declined further attempts to ambulate   Stairs            Wheelchair Mobility    Modified Rankin  (Stroke Patients Only)       Balance Overall balance assessment: Needs assistance   Sitting balance-Leahy Scale: Good     Standing balance support: Bilateral upper extremity supported Standing balance-Leahy Scale: Poor Standing balance comment: UE support for standing                            Cognition Arousal/Alertness: Awake/alert Behavior During Therapy: WFL for tasks assessed/performed Overall Cognitive Status: Within Functional Limits for tasks assessed                                        Exercises      General Comments General comments (skin integrity, edema, etc.): pt encouraged to attempt ambulating every hour and to maintain L LE elevated when at home; husband present during session      Pertinent Vitals/Pain Pain Assessment: Faces Faces Pain Scale: Hurts little more Pain Location: L foot and R LE with weightbearing Pain Descriptors / Indicators: Guarding;Sore;Aching Pain Intervention(s): Limited activity within patient's tolerance;Monitored during session;Premedicated before session;Repositioned    Home Living                      Prior Function            PT Goals (current goals can now be found in the care plan section) Acute Rehab PT Goals Patient Stated Goal: go home no matter what PT Goal Formulation: With patient Time For Goal Achievement: 02/08/18 Potential  to Achieve Goals: Fair Progress towards PT goals: Progressing toward goals    Frequency    Min 4X/week(Pt asserts she is going home. )      PT Plan Current plan remains appropriate    Co-evaluation              AM-PAC PT "6 Clicks" Daily Activity  Outcome Measure  Difficulty turning over in bed (including adjusting bedclothes, sheets and blankets)?: A Little Difficulty moving from lying on back to sitting on the side of the bed? : A Little Difficulty sitting down on and standing up from a chair with arms (e.g., wheelchair, bedside  commode, etc,.)?: Unable Help needed moving to and from a bed to chair (including a wheelchair)?: A Lot Help needed walking in hospital room?: A Little Help needed climbing 3-5 steps with a railing? : Total 6 Click Score: 13    End of Session Equipment Utilized During Treatment: Gait belt Activity Tolerance: Patient limited by fatigue Patient left: with call bell/phone within reach;in chair;with family/visitor present   PT Visit Diagnosis: Unsteadiness on feet (R26.81);Other abnormalities of gait and mobility (R26.89);Pain Pain - Right/Left: Left Pain - part of body: Ankle and joints of foot     Time: 1146-1200 PT Time Calculation (min) (ACUTE ONLY): 14 min  Charges:  $Gait Training: 8-22 mins                    G Codes:       Earney Navy, PTA Pager: [phone]     Darliss Cheney 02/03/2018, 1:09 PM

## 2018-02-03 NOTE — Progress Notes (Addendum)
Spoke with Family Medicine physician in person, who came to assess patient. No further orders given at this time.

## 2018-02-03 NOTE — Telephone Encounter (Signed)
Called patient to check on her and wish her a happy birthday. She was discharged from the hospital earlier today before I was able to swing by and say hello. She is doing alright at home, has walker and wheelchair.  Patient appreciative of the call. [name], MD

## 2018-02-03 NOTE — Telephone Encounter (Signed)
**  After Hours/ Emergency Line Call*  Received a call to report that [name] stating that she was discharged today, questioning why she only had one day of antibiotics left. Explained ID plan per their note earlier this week with end date of 3/2. Patient questions how she will know if infection recurs. Gave red flags (warmth, redness, fever, increasing pain). Will forward to PCP.  Ralene Ok, MD PGY-2, [facility] Family Medicine Residency

## 2018-02-08 ENCOUNTER — Inpatient Hospital Stay: Payer: Self-pay | Admitting: Family Medicine

## 2018-02-10 ENCOUNTER — Ambulatory Visit (INDEPENDENT_AMBULATORY_CARE_PROVIDER_SITE_OTHER): Payer: Medicaid Other | Admitting: Orthopedic Surgery

## 2018-02-10 ENCOUNTER — Encounter (INDEPENDENT_AMBULATORY_CARE_PROVIDER_SITE_OTHER): Payer: Self-pay | Admitting: Orthopedic Surgery

## 2018-02-10 VITALS — Ht 62.0 in | Wt 322.0 lb

## 2018-02-10 DIAGNOSIS — Z89422 Acquired absence of other left toe(s): Secondary | ICD-10-CM

## 2018-02-10 DIAGNOSIS — S98132A Complete traumatic amputation of one left lesser toe, initial encounter: Secondary | ICD-10-CM | POA: Insufficient documentation

## 2018-02-10 MED ORDER — PENTOXIFYLLINE ER 400 MG PO TBCR: 400.0000 mg | EXTENDED_RELEASE_TABLET | Freq: Three times a day (TID) | ORAL | 3 refills | Status: DC

## 2018-02-10 MED ORDER — NITROGLYCERIN 0.2 MG/HR TD PT24: 0.2000 mg | MEDICATED_PATCH | Freq: Every day | TRANSDERMAL | 12 refills | Status: DC

## 2018-02-10 NOTE — Progress Notes (Signed)
Office Visit Note   Patient: [name]           Date of Birth: 11-Jul-1976           MRN: [id number] Visit Date: 02/10/2018              Requested by: [name], MD [address] [place], [place] [address] PCP: [name], MD  Chief Complaint  Patient presents with  . Left Foot - Routine Post Op    02/01/18 Left 4th & 5th ray amputation      HPI: Patient presents for one-week status post left foot fourth and fifth ray amputation she has completed her course of antibiotics.  Assessment & Plan: Visit Diagnoses:  1. Amputated toe of left foot ([place])     Plan: Patient does have some mild ischemic changes with dependent redness.  She was given instructions for elevation Dial soap cleansing daily dry dressing change daily nitroglycerin patch change daily and Trental 3 times a day.  Follow-Up Instructions: Return in about 1 week (around 02/17/2018).   Ortho Exam  Patient is alert, oriented, no adenopathy, well-dressed, normal affect, normal respiratory effort. Examination patient has some dependent redness with resolved with elevation there is no tenderness to palpation there is some mild maceration around the skin edges and some mild ischemic changes.  There is no wound dehiscence there is no drainage.  There is no ascending cellulitis.  Imaging: No results found. No images are attached to the encounter.  Labs: Lab Results  Component Value Date   HGBA1C 13.8 (H) 01/27/2018   HGBA1C 13.7 (H) 10/29/2017   HGBA1C 11.6 08/18/2017   ESRSEDRATE 42 (H) 10/29/2017   ESRSEDRATE 38 (H) 07/13/2014   CRP 2.3 (H) 10/29/2017   REPTSTATUS 02/03/2018 FINAL 01/29/2018   GRAMSTAIN NO WBC SEEN RARE GRAM POSITIVE COCCI  01/11/2018   CULT  01/29/2018    NO GROWTH 5 DAYS Performed at [facility], [place] [address], [place], [place] [address]    LABORGA SERRATIA MARCESCENS 01/11/2018    @LABSALLVALUES (HGBA1)@  Body mass index is 58.89 kg/m.  Orders:    No orders of the defined types were placed in this encounter.  Meds ordered this encounter  Medications  . pentoxifylline (TRENTAL) 400 MG CR tablet    Sig: Take 1 tablet (400 mg total) by mouth 3 (three) times daily with meals.    Dispense:  90 tablet    Refill:  3  . nitroGLYCERIN (NITRODUR - DOSED IN MG/24 HR) 0.2 mg/hr patch    Sig: Place 1 patch (0.2 mg total) onto the skin daily.    Dispense:  30 patch    Refill:  12     Procedures: No procedures performed  Clinical Data: No additional findings.  ROS:  All other systems negative, except as noted in the HPI. Review of Systems  Objective: Vital Signs: Ht 5\' 2"  (1.575 m)   Wt (!) 322 lb (146.1 kg)   BMI 58.89 kg/m   Specialty Comments:  No specialty comments available.  PMFS History: Patient Active Problem List   Diagnosis Date Noted  . Amputated toe of left foot ([place]) 02/10/2018  . Acute osteomyelitis of ankle or foot, left ([place]) 01/31/2018  . Acute on chronic blood loss anemia ([name])   . AKI (acute kidney injury) ([place])   . Type 2 diabetes mellitus with foot ulcer, with long-term current use of insulin ([place])   . Chronic obstructive pulmonary disease ([place])   .  Subacute osteomyelitis, left ankle and foot ([place])   . Diabetic foot ulcer ([place]) 10/29/2017  . Ulcer of skin ([place]) 10/14/2017  . Decreased pedal pulses 06/10/2017  . Adjustment disorder with mixed anxiety and depressed mood 11/04/2016  . History of medication noncompliance 11/04/2016  . Unilateral primary osteoarthritis, right knee 10/22/2016  . Diabetic neuropathy ([place]) 07/14/2016  . Syncope 02/25/2016  . De Quervain's tenosynovitis, bilateral 11/01/2015  . Vitamin D deficiency 09/05/2015  . Recurrent candidiasis of vagina 09/05/2015  . Varicose veins of leg with complications 43/15/4008  . Neuropathy, intercostal nerve 12/17/2014  . Rectal itching 11/17/2014  . Restless leg syndrome 10/17/2014  . Chronic sinusitis 07/18/2014  . Headache  07/15/2014  . Vision, loss, sudden 07/12/2014  . Urge incontinence 10/15/2013  . Encounter for chronic pain management 06/30/2013  . Low back pain 01/31/2013  . HLD (hyperlipidemia) 11/19/2012  . Chest pain 06/27/2012  . Right carpal tunnel syndrome 09/01/2011  . Bilateral knee pain 09/01/2011  . Diabetes mellitus ([place]) 05/22/2008  . Morbid obesity ([place]) 05/22/2008  . 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 Depression 05/22/2008  . Obstructive sleep apnea 05/22/2008  . Hypertension 05/22/2008  . Asthma 05/22/2008  . GERD 05/22/2008   Past Medical History:  Diagnosis Date  . Acute osteomyelitis of ankle or foot, left ([place]) 01/31/2018  . Alveolar hypoventilation   . Anemia    not on iron pill  . Arthritis   . Asthma   . Bipolar 2 disorder ([employer])   . Carpal tunnel syndrome on right    recurrent  . Cellulitis 08/2010-08/2011  . Cellulitis of left foot 01/27/2018  . Chronic pain   . COPD (chronic obstructive pulmonary disease) ([name])    Symbicort daily and Proventil as needed  . Costochondritis   . Depression   . Diabetes mellitus 2000   Type 2, Uncontrolled.Takes Lantus daily.Fasting blood sugar runs 150  . Dizziness    occasionally  . Drug-seeking behavior   . GERD (gastroesophageal reflux disease)    takes Pantoprazole and Zantac daily  . Headache    migraine-last one about a yr ago.Topamax daily  . HLD (hyperlipidemia)    takes Atorvastatin daily  . Hypertension    takes Lisinopril and Coreg daily  . Morbid obesity ([place])   . Muscle spasm    takes Flexeril as needed  . Nocturia   . Obstructive sleep apnea   . Peripheral neuropathy    takes Gabapentin daily  . Pneumonia    "walking" several yrs ago and as a baby  . Rectal fissure   . Restless leg   . Urinary frequency   . Varicose veins    Right medial thigh and Left leg     Family History  Problem Relation Age of Onset  . Diabetes Mother   . Hyperlipidemia Mother   . Depression Mother   . Varicose  Veins Mother   . Heart attack Paternal Uncle   . Heart disease Paternal Grandmother   . Heart attack Paternal Grandmother   . Heart attack Paternal Grandfather   . Heart disease Paternal Grandfather   . Heart attack Father   . Cancer Maternal Grandmother        COLON  . Hypertension Maternal Grandmother   . Hyperlipidemia Maternal Grandmother   . Diabetes Maternal Grandmother   . Other Maternal Grandfather        GUN SHOT    Past Surgical History:  Procedure Laterality Date  . AMPUTATION Left 02/01/2018  Procedure: LEFT FOURTH AND 5TH TOE RAY AMPUTATION;  Surgeon: [name], MD;  Location: [facility];  Service: Orthopedics;  Laterality: Left;  . CARPAL TUNNEL RELEASE    . CESAREAN SECTION    . KNEE ARTHROSCOPY Right 07/17/2010  . LEFT HEART CATHETERIZATION WITH CORONARY ANGIOGRAM N/A 07/27/2012   Procedure: LEFT HEART CATHETERIZATION WITH CORONARY ANGIOGRAM;  Surgeon: Sherren Mocha, MD;  Location: [facility] CATH LAB;  Service: Cardiovascular;  Laterality: N/A;  . left knee surgery     screws she thinks  . MASS EXCISION N/A 06/29/2013   Procedure:  WIDE LOCAL EXCISION OF POSTERIOR NECK ABSCESS;  Surgeon: Ralene Ok, MD;  Location: [facility];  Service: General;  Laterality: N/A;   Social History   Occupational History  . Not on file  Tobacco Use  . Smoking status: Former Smoker    Packs/day: 0.30    Years: 0.30    Pack years: 0.09    Types: Cigarettes    Last attempt to quit: 12/06/1993    Years since quitting: 24.1  . Smokeless tobacco: Never Used  Substance and Sexual Activity  . Alcohol use: No    Alcohol/week: 0.0 oz  . Drug use: Yes    Comment: OD attempts on home meds    . Sexual activity: Yes    Partners: Male

## 2018-02-13 ENCOUNTER — Encounter (INDEPENDENT_AMBULATORY_CARE_PROVIDER_SITE_OTHER): Payer: Self-pay | Admitting: Orthopedic Surgery

## 2018-02-13 ENCOUNTER — Ambulatory Visit (INDEPENDENT_AMBULATORY_CARE_PROVIDER_SITE_OTHER): Payer: Medicaid Other | Admitting: Orthopedic Surgery

## 2018-02-13 VITALS — Ht 62.0 in | Wt 322.0 lb

## 2018-02-13 DIAGNOSIS — Z89422 Acquired absence of other left toe(s): Secondary | ICD-10-CM | POA: Diagnosis not present

## 2018-02-13 DIAGNOSIS — S98132A Complete traumatic amputation of one left lesser toe, initial encounter: Secondary | ICD-10-CM

## 2018-02-13 NOTE — Progress Notes (Addendum)
Office Visit Note   Patient: [name]           Date of Birth: Jul 05, 1976           MRN: [id number] Visit Date: 02/13/2018              Requested by: [name], MD [address] [place], [place] [address] PCP: [name], MD  Chief Complaint  Patient presents with  . Left Foot - Routine Post Op    02/01/18 4th and 5th ray amputation       HPI: Patient presents in follow-up status post fourth and fifth ray amputation left foot.  She does have a nitroglycerin patch in place she states that the Trental is to be picked up today.  Patient states she thought there was some pus and that a stitch came out.  Assessment & Plan: Visit Diagnoses:  1. Amputated toe of left foot ([name])     Plan: Recommended continue cleansing with soap and water daily 4 x 4 gauze Ace wrap nonweightbearing on the left.  She will continue with the nitroglycerin patch she will start the Trental.  There is no signs of purulence no sutures have fallen out.  Patient states she wants to try using crutches.  She does not feel safe with her walker.  Follow-Up Instructions: Return in about 1 week (around 02/20/2018).   Ortho Exam  Patient is alert, oriented, no adenopathy, well-dressed, normal affect, normal respiratory effort. Examination patient has no cellulitis there is no drainage no odor to the left foot.  There is area of an eschar that is approximately half a centimeter by 1 cm.  This appears superficial.  There is minimal swelling.  All the sutures are in place.  Imaging: No results found. No images are attached to the encounter.  Labs: Lab Results  Component Value Date   HGBA1C 13.8 (H) 01/27/2018   HGBA1C 13.7 (H) 10/29/2017   HGBA1C 11.6 08/18/2017   ESRSEDRATE 42 (H) 10/29/2017   ESRSEDRATE 38 (H) 07/13/2014   CRP 2.3 (H) 10/29/2017   REPTSTATUS 02/03/2018 FINAL 01/29/2018   GRAMSTAIN NO WBC SEEN RARE GRAM POSITIVE COCCI  01/11/2018   CULT  01/29/2018    NO  GROWTH 5 DAYS Performed at [facility], [place] [address], [place], [place] [address]    LABORGA SERRATIA MARCESCENS 01/11/2018    @LABSALLVALUES (HGBA1)@  Body mass index is 58.89 kg/m.  Orders:  No orders of the defined types were placed in this encounter.  No orders of the defined types were placed in this encounter.    Procedures: No procedures performed  Clinical Data: No additional findings.  ROS:  All other systems negative, except as noted in the HPI. Review of Systems  Objective: Vital Signs: Ht 5\' 2"  (1.575 m)   Wt (!) 322 lb (146.1 kg)   BMI 58.89 kg/m   Specialty Comments:  No specialty comments available.  PMFS History: Patient Active Problem List   Diagnosis Date Noted  . Amputated toe of left foot ([place]) 02/10/2018  . Acute osteomyelitis of ankle or foot, left ([place]) 01/31/2018  . Acute on chronic blood loss anemia ([name])   . AKI (acute kidney injury) ([place])   . Type 2 diabetes mellitus with foot ulcer, with long-term current use of insulin ([place])   . Chronic obstructive pulmonary disease ([place])   . Subacute osteomyelitis, left ankle and foot ([place])   . Diabetic foot ulcer ([place]) 10/29/2017  . Ulcer  of skin ([place]) 10/14/2017  . Decreased pedal pulses 06/10/2017  . Adjustment disorder with mixed anxiety and depressed mood 11/04/2016  . History of medication noncompliance 11/04/2016  . Unilateral primary osteoarthritis, right knee 10/22/2016  . Diabetic neuropathy ([place]) 07/14/2016  . Syncope 02/25/2016  . De Quervain's tenosynovitis, bilateral 11/01/2015  . Vitamin D deficiency 09/05/2015  . Recurrent candidiasis of vagina 09/05/2015  . Varicose veins of leg with complications 05/29/7627  . Neuropathy, intercostal nerve 12/17/2014  . Rectal itching 11/17/2014  . Restless leg syndrome 10/17/2014  . Chronic sinusitis 07/18/2014  . Headache 07/15/2014  . Vision, loss, sudden 07/12/2014  . Urge incontinence 10/15/2013  . Encounter for chronic pain  management 06/30/2013  . Low back pain 01/31/2013  . HLD (hyperlipidemia) 11/19/2012  . Chest pain 06/27/2012  . Right carpal tunnel syndrome 09/01/2011  . Bilateral knee pain 09/01/2011  . Diabetes mellitus ([place]) 05/22/2008  . Morbid obesity ([place]) 05/22/2008  . 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 Depression 05/22/2008  . Obstructive sleep apnea 05/22/2008  . Hypertension 05/22/2008  . Asthma 05/22/2008  . GERD 05/22/2008   Past Medical History:  Diagnosis Date  . Acute osteomyelitis of ankle or foot, left ([place]) 01/31/2018  . Alveolar hypoventilation   . Anemia    not on iron pill  . Arthritis   . Asthma   . Bipolar 2 disorder ([place])   . Carpal tunnel syndrome on right    recurrent  . Cellulitis 08/2010-08/2011  . Cellulitis of left foot 01/27/2018  . Chronic pain   . COPD (chronic obstructive pulmonary disease) ([name])    Symbicort daily and Proventil as needed  . Costochondritis   . Depression   . Diabetes mellitus 2000   Type 2, Uncontrolled.Takes Lantus daily.Fasting blood sugar runs 150  . Dizziness    occasionally  . Drug-seeking behavior   . GERD (gastroesophageal reflux disease)    takes Pantoprazole and Zantac daily  . Headache    migraine-last one about a yr ago.Topamax daily  . HLD (hyperlipidemia)    takes Atorvastatin daily  . Hypertension    takes Lisinopril and Coreg daily  . Morbid obesity ([place])   . Muscle spasm    takes Flexeril as needed  . Nocturia   . Obstructive sleep apnea   . Peripheral neuropathy    takes Gabapentin daily  . Pneumonia    "walking" several yrs ago and as a baby  . Rectal fissure   . Restless leg   . Urinary frequency   . Varicose veins    Right medial thigh and Left leg     Family History  Problem Relation Age of Onset  . Diabetes Mother   . Hyperlipidemia Mother   . Depression Mother   . Varicose Veins Mother   . Heart attack Paternal Uncle   . Heart disease Paternal Grandmother   . Heart attack Paternal  Grandmother   . Heart attack Paternal Grandfather   . Heart disease Paternal Grandfather   . Heart attack Father   . Cancer Maternal Grandmother        COLON  . Hypertension Maternal Grandmother   . Hyperlipidemia Maternal Grandmother   . Diabetes Maternal Grandmother   . Other Maternal Grandfather        GUN SHOT    Past Surgical History:  Procedure Laterality Date  . AMPUTATION Left 02/01/2018   Procedure: LEFT FOURTH AND 5TH TOE RAY AMPUTATION;  Surgeon: [name], MD;  Location[facility]  OR;  Service: Orthopedics;  Laterality: Left;  . CARPAL TUNNEL RELEASE    . CESAREAN SECTION    . KNEE ARTHROSCOPY Right 07/17/2010  . LEFT HEART CATHETERIZATION WITH CORONARY ANGIOGRAM N/A 07/27/2012   Procedure: LEFT HEART CATHETERIZATION WITH CORONARY ANGIOGRAM;  Surgeon: Sherren Mocha, MD;  Location: [facility] CATH LAB;  Service: Cardiovascular;  Laterality: N/A;  . left knee surgery     screws she thinks  . MASS EXCISION N/A 06/29/2013   Procedure:  WIDE LOCAL EXCISION OF POSTERIOR NECK ABSCESS;  Surgeon: Ralene Ok, MD;  Location: [facility];  Service: General;  Laterality: N/A;   Social History   Occupational History  . Not on file  Tobacco Use  . Smoking status: Former Smoker    Packs/day: 0.30    Years: 0.30    Pack years: 0.09    Types: Cigarettes    Last attempt to quit: 12/06/1993    Years since quitting: 24.2  . Smokeless tobacco: Never Used  Substance and Sexual Activity  . Alcohol use: No    Alcohol/week: 0.0 oz  . Drug use: Yes    Comment: OD attempts on home meds    . Sexual activity: Yes    Partners: Male

## 2018-02-15 ENCOUNTER — Ambulatory Visit: Payer: Medicaid Other | Admitting: Family Medicine

## 2018-02-16 ENCOUNTER — Ambulatory Visit (INDEPENDENT_AMBULATORY_CARE_PROVIDER_SITE_OTHER): Payer: Medicaid Other | Admitting: Orthopedic Surgery

## 2018-02-20 ENCOUNTER — Telehealth (INDEPENDENT_AMBULATORY_CARE_PROVIDER_SITE_OTHER): Payer: Self-pay

## 2018-02-20 NOTE — Telephone Encounter (Signed)
Patient called and left voicemail. she states the blood thinner is making her sick and would like to know if there is something else she can take instead. Did not specify the name of blood thinner.   CB [phone]

## 2018-02-21 NOTE — Telephone Encounter (Signed)
Have her take a baby aspirin daily and make sure she is using the nitroglycerin patch changing daily.

## 2018-02-21 NOTE — Telephone Encounter (Signed)
Tried to call pt and message states voicemail not set up yet. Will try again later.

## 2018-02-21 NOTE — Telephone Encounter (Signed)
Called pt and she states that her Trental is causing her to be nauseated and she stopped taking it Sunday. Wants to know if there is anything else that she needs to do. S/p 4th and 5th ray amputation 2/27

## 2018-02-21 NOTE — Telephone Encounter (Signed)
Called and advised pt of message below. She voiced understanding and will call with questions.

## 2018-02-21 NOTE — Telephone Encounter (Signed)
Tried to call pt and message states vm not set up yet. Will try again later.  Need more information. We do not put pt's on a blood thinners after surgery. At most an ASA for 30 days will have to confirm with the pt what medication she is talking about.

## 2018-02-23 ENCOUNTER — Ambulatory Visit (INDEPENDENT_AMBULATORY_CARE_PROVIDER_SITE_OTHER): Payer: Medicaid Other | Admitting: Orthopedic Surgery

## 2018-02-23 ENCOUNTER — Encounter (INDEPENDENT_AMBULATORY_CARE_PROVIDER_SITE_OTHER): Payer: Self-pay | Admitting: Orthopedic Surgery

## 2018-02-23 VITALS — Ht 62.0 in | Wt 322.0 lb

## 2018-02-23 DIAGNOSIS — Z89422 Acquired absence of other left toe(s): Secondary | ICD-10-CM

## 2018-02-23 DIAGNOSIS — S98132A Complete traumatic amputation of one left lesser toe, initial encounter: Secondary | ICD-10-CM

## 2018-02-23 NOTE — Progress Notes (Signed)
Office Visit Note   Patient: [name]           Date of Birth: [birth year]           MRN: [id number] Visit Date: 02/23/2018              Requested by: [name], MD [address] [place], [place] [address] PCP: [name], MD  Chief Complaint  Patient presents with  . Left Foot - Routine Post Op    02/01/18 left 4th & 5th ray amputation      HPI: Patient is a 42 year old woman who presents in follow-up status post left foot fourth and fifth ray amputation.  The sutures are intact.  Patient states she has had some increased clear drainage over the last several days she is using the nitroglycerin patch but stopped the Trental secondary to GI upset she is currently on baby aspirin.  Patient states that she bed in the bedside commode 2 days ago.  She does not feel like she struck her foot.  Assessment & Plan: Visit Diagnoses:  1. Amputated toe of left foot ([place])     Plan: Recommended continue with Dial soap cleansing dry dressing changes daily she has a dry eschar does not need any topical wound ointments.  Continue with elevation and continue nonweightbearing.  Follow-Up Instructions: Return in about 1 week (around 03/02/2018).   Ortho Exam  Patient is alert, oriented, no adenopathy, well-dressed, normal affect, normal respiratory effort. Examination patient has black eschar approximately 1 cm wound over the middle aspect of the incision.  There is some dermatitis around the edges some clear drainage there is no ascending cellulitis no abscess no signs of any deep infection she has a palpable dorsalis pedis pulse this seems to be more ischemic microcirculation issue.  Imaging: No results found. No images are attached to the encounter.  Labs: Lab Results  Component Value Date   HGBA1C 13.8 (H) 01/27/2018   HGBA1C 13.7 (H) 10/29/2017   HGBA1C 11.6 08/18/2017   ESRSEDRATE 42 (H) 10/29/2017   ESRSEDRATE 38 (H) 07/13/2014   CRP 2.3 (H) 10/29/2017   REPTSTATUS 02/03/2018 FINAL 01/29/2018   GRAMSTAIN NO WBC SEEN RARE GRAM POSITIVE COCCI  01/11/2018   CULT  01/29/2018    NO GROWTH 5 DAYS Performed at [facility], [place] [address] [place], [place] [address]    LABORGA SERRATIA MARCESCENS 01/11/2018    @LABSALLVALUES (HGBA1)@  Body mass index is 58.89 kg/m.  Orders:  No orders of the defined types were placed in this encounter.  No orders of the defined types were placed in this encounter.    Procedures: No procedures performed  Clinical Data: No additional findings.  ROS:  All other systems negative, except as noted in the HPI. Review of Systems  Objective: Vital Signs: Ht 5\' 2"  (1.575 m)   Wt (!) 322 lb (146.1 kg)   BMI 58.89 kg/m   Specialty Comments:  No specialty comments available.  PMFS History: Patient Active Problem List   Diagnosis Date Noted  . Amputated toe of left foot ([place]) 02/10/2018  . Acute osteomyelitis of ankle or foot, left ([place]) 01/31/2018  . Acute on chronic blood loss anemia ([name])   . AKI (acute kidney injury) ([place])   . Type 2 diabetes mellitus with foot ulcer, with long-term current use of insulin ([place])   . Chronic obstructive pulmonary disease ([place])   . Subacute osteomyelitis, left ankle and foot ([place])   .  Diabetic foot ulcer ([place]) 10/29/2017  . Ulcer of skin ([place]) 10/14/2017  . Decreased pedal pulses 06/10/2017  . Adjustment disorder with mixed anxiety and depressed mood 11/04/2016  . History of medication noncompliance 11/04/2016  . Unilateral primary osteoarthritis, right knee 10/22/2016  . Diabetic neuropathy ([place]) 07/14/2016  . Syncope 02/25/2016  . De Quervain's tenosynovitis, bilateral 11/01/2015  . Vitamin D deficiency 09/05/2015  . Recurrent candidiasis of vagina 09/05/2015  . Varicose veins of leg with complications 09/73/5329  . Neuropathy, intercostal nerve 12/17/2014  . Rectal itching 11/17/2014  . Restless leg syndrome 10/17/2014  . Chronic sinusitis  07/18/2014  . Headache 07/15/2014  . Vision, loss, sudden 07/12/2014  . Urge incontinence 10/15/2013  . Encounter for chronic pain management 06/30/2013  . Low back pain 01/31/2013  . HLD (hyperlipidemia) 11/19/2012  . Chest pain 06/27/2012  . Right carpal tunnel syndrome 09/01/2011  . Bilateral knee pain 09/01/2011  . Diabetes mellitus ([place]) 05/22/2008  . Morbid obesity ([place]) 05/22/2008  . 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 Depression 05/22/2008  . Obstructive sleep apnea 05/22/2008  . Hypertension 05/22/2008  . Asthma 05/22/2008  . GERD 05/22/2008   Past Medical History:  Diagnosis Date  . Acute osteomyelitis of ankle or foot, left ([place]) 01/31/2018  . Alveolar hypoventilation   . Anemia    not on iron pill  . Arthritis   . Asthma   . Bipolar 2 disorder ([place])   . Carpal tunnel syndrome on right    recurrent  . Cellulitis 08/2010-08/2011  . Cellulitis of left foot 01/27/2018  . Chronic pain   . COPD (chronic obstructive pulmonary disease) ([name])    Symbicort daily and Proventil as needed  . Costochondritis   . Depression   . Diabetes mellitus 2000   Type 2, Uncontrolled.Takes Lantus daily.Fasting blood sugar runs 150  . Dizziness    occasionally  . Drug-seeking behavior   . GERD (gastroesophageal reflux disease)    takes Pantoprazole and Zantac daily  . Headache    migraine-last one about a yr ago.Topamax daily  . HLD (hyperlipidemia)    takes Atorvastatin daily  . Hypertension    takes Lisinopril and Coreg daily  . Morbid obesity ([place])   . Muscle spasm    takes Flexeril as needed  . Nocturia   . Obstructive sleep apnea   . Peripheral neuropathy    takes Gabapentin daily  . Pneumonia    "walking" several yrs ago and as a baby  . Rectal fissure   . Restless leg   . Urinary frequency   . Varicose veins    Right medial thigh and Left leg     Family History  Problem Relation Age of Onset  . Diabetes Mother   . Hyperlipidemia Mother   .  Depression Mother   . Varicose Veins Mother   . Heart attack Paternal Uncle   . Heart disease Paternal Grandmother   . Heart attack Paternal Grandmother   . Heart attack Paternal Grandfather   . Heart disease Paternal Grandfather   . Heart attack Father   . Cancer Maternal Grandmother        COLON  . Hypertension Maternal Grandmother   . Hyperlipidemia Maternal Grandmother   . Diabetes Maternal Grandmother   . Other Maternal Grandfather        GUN SHOT    Past Surgical History:  Procedure Laterality Date  . AMPUTATION Left 02/01/2018   Procedure: LEFT FOURTH AND 5TH TOE RAY AMPUTATION;  Surgeon: [name], MD;  Location: [facility];  Service: Orthopedics;  Laterality: Left;  . CARPAL TUNNEL RELEASE    . CESAREAN SECTION    . KNEE ARTHROSCOPY Right 07/17/2010  . LEFT HEART CATHETERIZATION WITH CORONARY ANGIOGRAM N/A 07/27/2012   Procedure: LEFT HEART CATHETERIZATION WITH CORONARY ANGIOGRAM;  Surgeon: Sherren Mocha, MD;  Location[facility] CATH LAB;  Service: Cardiovascular;  Laterality: N/A;  . left knee surgery     screws she thinks  . MASS EXCISION N/A 06/29/2013   Procedure:  WIDE LOCAL EXCISION OF POSTERIOR NECK ABSCESS;  Surgeon: Ralene Ok, MD;  Location: [facility];  Service: General;  Laterality: N/A;   Social History   Occupational History  . Not on file  Tobacco Use  . Smoking status: Former Smoker    Packs/day: 0.30    Years: 0.30    Pack years: 0.09    Types: Cigarettes    Last attempt to quit: 12/06/1993    Years since quitting: 24.2  . Smokeless tobacco: Never Used  Substance and Sexual Activity  . Alcohol use: No    Alcohol/week: 0.0 oz  . Drug use: Yes    Comment: OD attempts on home meds    . Sexual activity: Yes    Partners: Male

## 2018-02-27 ENCOUNTER — Ambulatory Visit (INDEPENDENT_AMBULATORY_CARE_PROVIDER_SITE_OTHER): Payer: Medicaid Other | Admitting: Orthopedic Surgery

## 2018-02-27 ENCOUNTER — Encounter (INDEPENDENT_AMBULATORY_CARE_PROVIDER_SITE_OTHER): Payer: Self-pay | Admitting: Orthopedic Surgery

## 2018-02-27 DIAGNOSIS — T8781 Dehiscence of amputation stump: Secondary | ICD-10-CM

## 2018-02-27 MED ORDER — DOXYCYCLINE HYCLATE 100 MG PO TABS: 100.0000 mg | ORAL_TABLET | Freq: Two times a day (BID) | ORAL | 0 refills | Status: DC

## 2018-02-27 NOTE — Progress Notes (Signed)
Office Visit Note   Patient: [name]           Date of Birth: Jan 28, 1976           MRN: [id number] Visit Date: 02/27/2018              Requested by: [name], MD [address] [place], [place] [address] PCP: [name], MD  Chief Complaint  Patient presents with  . 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  Patient states she is having progressive wound dehiscence.  Denies any systemic symptoms.  Assessment & Plan: Visit Diagnoses:  1. Dehiscence of amputation stump ([name])     Plan: Due to the progressive dehiscence of the wound patient will need to proceed with a transtibial amputation.  Risks and benefits of surgery were discussed.  We will call in a prescription for doxycycline she will start dry dressing changes daily and plan for surgery on Friday.  Discussed that she most likely would need outpatient rehabilitation but inpatient rehabilitation is an option.  She was given a note that she cannot hear and a court date for at least 3 months.  Her family will need FMLA paperwork completed as well.  Follow-Up Instructions: Return in about 2 weeks (around 03/13/2018).   Ortho Exam  Patient is alert, oriented, no adenopathy, well-dressed, normal affect, normal respiratory effort. Examination patient has progressive wound dehiscence with necrosis along the wound edges.  She has a good dorsalis pedis and posterior tibial pulse she has good hair growth down the ankle her problem seems to be primarily microcirculatory with poor control of the diabetes.  Imaging: No results found. No images are attached to the encounter.  Labs: Lab Results  Component Value Date   HGBA1C 13.8 (H) 01/27/2018   HGBA1C 13.7 (H) 10/29/2017   HGBA1C 11.6 08/18/2017   ESRSEDRATE 42 (H) 10/29/2017   ESRSEDRATE 38 (H) 07/13/2014   CRP 2.3 (H) 10/29/2017   REPTSTATUS 02/03/2018 FINAL 01/29/2018   GRAMSTAIN NO WBC SEEN RARE GRAM POSITIVE COCCI  01/11/2018   CULT  01/29/2018    NO GROWTH 5 DAYS Performed at  [facility] [address] [place], [place] [address]    LABORGA SERRATIA MARCESCENS 01/11/2018    @LABSALLVALUES (HGBA1)@  There is no height or weight on file to calculate BMI.  Orders:  No orders of the defined types were placed in this encounter.  Meds ordered this encounter  Medications  . doxycycline (VIBRA-TABS) 100 MG tablet    Sig: Take 1 tablet (100 mg total) by mouth 2 (two) times daily.    Dispense:  60 tablet    Refill:  0     Procedures: No procedures performed  Clinical Data: No additional findings.  ROS:  All other systems negative, except as noted in the HPI. Review of Systems  Objective: Vital Signs: There were no vitals taken for this visit.  Specialty Comments:  No specialty comments available.  PMFS History: Patient Active Problem List   Diagnosis Date Noted  . Amputated toe of left foot ([place]) 02/10/2018  . Acute osteomyelitis of ankle or foot, left ([place]) 01/31/2018  . Acute on chronic blood loss anemia ([name])   . AKI (acute kidney injury) ([place])   . Type 2 diabetes mellitus with foot ulcer, with long-term current use of insulin ([place])   .  Chronic obstructive pulmonary disease ([place])   . Subacute osteomyelitis, left ankle and foot ([place])   . Diabetic foot ulcer ([place]) 10/29/2017  . Ulcer of skin ([place]) 10/14/2017  . Decreased pedal pulses 06/10/2017  . Adjustment disorder with mixed anxiety and depressed mood 11/04/2016  . History of medication noncompliance 11/04/2016  . Unilateral primary osteoarthritis, right knee 10/22/2016  . Diabetic neuropathy ([place]) 07/14/2016  . Syncope 02/25/2016  . De Quervain's tenosynovitis, bilateral 11/01/2015  . Vitamin D deficiency 09/05/2015  . Recurrent candidiasis of vagina 09/05/2015  . Varicose veins of leg with complications 65/99/3570  .  Neuropathy, intercostal nerve 12/17/2014  . Rectal itching 11/17/2014  . Restless leg syndrome 10/17/2014  . Chronic sinusitis 07/18/2014  . Headache 07/15/2014  . Vision, loss, sudden 07/12/2014  . Urge incontinence 10/15/2013  . Encounter for chronic pain management 06/30/2013  . Low back pain 01/31/2013  . HLD (hyperlipidemia) 11/19/2012  . Chest pain 06/27/2012  . Right carpal tunnel syndrome 09/01/2011  . Bilateral knee pain 09/01/2011  . Diabetes mellitus ([place]) 05/22/2008  . Morbid obesity ([place]) 05/22/2008  . 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 Depression 05/22/2008  . Obstructive sleep apnea 05/22/2008  . Hypertension 05/22/2008  . Asthma 05/22/2008  . GERD 05/22/2008   Past Medical History:  Diagnosis Date  . Acute osteomyelitis of ankle or foot, left ([place]) 01/31/2018  . Alveolar hypoventilation   . Anemia    not on iron pill  . Arthritis   . Asthma   . Bipolar 2 disorder ([place])   . Carpal tunnel syndrome on right    recurrent  . Cellulitis 08/2010-08/2011  . Cellulitis of left foot 01/27/2018  . Chronic pain   . COPD (chronic obstructive pulmonary disease) ([name])    Symbicort daily and Proventil as needed  . Costochondritis   . Depression   . Diabetes mellitus 2000   Type 2, Uncontrolled.Takes Lantus daily.Fasting blood sugar runs 150  . Dizziness    occasionally  . Drug-seeking behavior   . GERD (gastroesophageal reflux disease)    takes Pantoprazole and Zantac daily  . Headache    migraine-last one about a yr ago.Topamax daily  . HLD (hyperlipidemia)    takes Atorvastatin daily  . Hypertension    takes Lisinopril and Coreg daily  . Morbid obesity ([place])   . Muscle spasm    takes Flexeril as needed  . Nocturia   . Obstructive sleep apnea   . Peripheral neuropathy    takes Gabapentin daily  . Pneumonia    "walking" several yrs ago and as a baby  . Rectal fissure   . Restless leg   . Urinary frequency   . Varicose veins    Right medial  thigh and Left leg     Family History  Problem Relation Age of Onset  . Diabetes Mother   . Hyperlipidemia Mother   . Depression Mother   . Varicose Veins Mother   . Heart attack Paternal Uncle   . Heart disease Paternal Grandmother   . Heart attack Paternal Grandmother   . Heart attack Paternal Grandfather   . Heart disease Paternal Grandfather   . Heart attack Father   . Cancer Maternal Grandmother        COLON  . Hypertension Maternal Grandmother   . Hyperlipidemia Maternal Grandmother   . Diabetes Maternal Grandmother   . Other Maternal Grandfather        GUN SHOT    Past Surgical History:  Procedure  Laterality Date  . AMPUTATION Left 02/01/2018   Procedure: LEFT FOURTH AND 5TH TOE RAY AMPUTATION;  Surgeon: [name], MD;  Location: [facility];  Service: Orthopedics;  Laterality: Left;  . CARPAL TUNNEL RELEASE    . CESAREAN SECTION    . KNEE ARTHROSCOPY Right 07/17/2010  . LEFT HEART CATHETERIZATION WITH CORONARY ANGIOGRAM N/A 07/27/2012   Procedure: LEFT HEART CATHETERIZATION WITH CORONARY ANGIOGRAM;  Surgeon: Sherren Mocha, MD;  Location: [facility] CATH LAB;  Service: Cardiovascular;  Laterality: N/A;  . left knee surgery     screws she thinks  . MASS EXCISION N/A 06/29/2013   Procedure:  WIDE LOCAL EXCISION OF POSTERIOR NECK ABSCESS;  Surgeon: Ralene Ok, MD;  Location: [facility];  Service: General;  Laterality: N/A;   Social History   Occupational History  . Not on file  Tobacco Use  . Smoking status: Former Smoker    Packs/day: 0.30    Years: 0.30    Pack years: 0.09    Types: Cigarettes    Last attempt to quit: 12/06/1993    Years since quitting: 24.2  . Smokeless tobacco: Never Used  Substance and Sexual Activity  . Alcohol use: No    Alcohol/week: 0.0 oz  . Drug use: Yes    Comment: OD attempts on home meds    . Sexual activity: Yes    Partners: Male

## 2018-02-28 ENCOUNTER — Other Ambulatory Visit: Payer: Self-pay

## 2018-02-28 ENCOUNTER — Encounter: Payer: Self-pay | Admitting: Family Medicine

## 2018-02-28 ENCOUNTER — Ambulatory Visit (INDEPENDENT_AMBULATORY_CARE_PROVIDER_SITE_OTHER): Payer: Medicaid Other | Admitting: Family Medicine

## 2018-02-28 DIAGNOSIS — J309 Allergic rhinitis, unspecified: Secondary | ICD-10-CM

## 2018-02-28 DIAGNOSIS — E0810 Diabetes mellitus due to underlying condition with ketoacidosis without coma: Secondary | ICD-10-CM

## 2018-02-28 MED ORDER — CETIRIZINE HCL 10 MG PO TABS: 10.0000 mg | ORAL_TABLET | Freq: Every day | ORAL | 11 refills | Status: DC

## 2018-02-28 NOTE — Patient Instructions (Signed)
I'm sorry to hear about the surgery you have to have. Go up on Tresiba to 85 units daily  Sent in refill on zyrtec.  I'll try to see you in the hospital next week, and then they'll schedule follow up with me after you get discharged. Good luck with surgery.  Be well, Dr. [name]

## 2018-03-01 ENCOUNTER — Other Ambulatory Visit: Payer: Self-pay | Admitting: Family Medicine

## 2018-03-01 ENCOUNTER — Other Ambulatory Visit (INDEPENDENT_AMBULATORY_CARE_PROVIDER_SITE_OTHER): Payer: Self-pay | Admitting: Orthopedic Surgery

## 2018-03-01 DIAGNOSIS — T8781 Dehiscence of amputation stump: Secondary | ICD-10-CM

## 2018-03-02 ENCOUNTER — Encounter (HOSPITAL_COMMUNITY): Payer: Self-pay | Admitting: *Deleted

## 2018-03-02 ENCOUNTER — Ambulatory Visit (INDEPENDENT_AMBULATORY_CARE_PROVIDER_SITE_OTHER): Payer: Medicaid Other | Admitting: Orthopedic Surgery

## 2018-03-02 MED ORDER — CEFAZOLIN SODIUM 10 G IJ SOLR
3.0000 g | INTRAMUSCULAR | Status: AC
  Administered 2018-03-03: 3 g via INTRAVENOUS
  Filled 2018-03-02: qty 3000

## 2018-03-02 NOTE — Progress Notes (Signed)
Ms [name] reports that CBG yesterday was in the 300's , she has not checked it today.  I encouraged patient to check CBG and she is she can tell anything that might be causing it to go up- foods. I instructed patient that if CBG is greater than 70 to take 1/2 of Tresiba- 42 units.  I instructed patient to check CBG after awaking and every 2 hours until arrival  to the hospital.  I Instructed patient if CBG is less than 70 to drink 1/2 cup of a clear juice. Recheck CBG in 15 minutes then call pre- op desk at [phone] for further instructions. If scheduled to receive Insulin, do not take Insulin

## 2018-03-02 NOTE — Progress Notes (Signed)
Anesthesia Chart Review:  Pt is a same day work up.   Pt is a 42 year old female scheduled for L BKA on 03/03/2018 with Meridee Score, MD  - PCP is [name], MD. Last office visit 01/25/18 - Cardiologist is Sherren Mocha, MD. Last office visit 08/01/17 with Leanor Kail, PA  PMH includes:  HTN, DM, hyperlipidemia, COPD, OSA, asthma, anemia, bipolar disorder, GERD. Former smoker (quit 1995). BMI 59. S/p L 4th and 5th ray amputation.   Medications include: albuterol, lipitor, carvedilol, doxycycline, novolog, tresiba, liraglutide, protonix, symbicort  Labs will be obtained day of surgery.  - HbA1c was 13.9 on 01/27/18  CXR 09/03/17: No acute abnormality.  Upper normal heart size.  EKG 09/03/17: Sinus tachycardia (103 bpm). Borderline right axis deviation. Borderline low voltage, extremity leads. Anterolateral Q wave, probably normal for age  Holter monitor 08/16/17: - Sinus rhythm with average HR 93 bpm - Frequent PVC's (4%) - Occasional ventricular couplets - No sustained arrhythmia   Echo 09/02/27:  - Left ventricle: The cavity size was normal. Systolic function was normal. The estimated ejection fraction was in the range of 55% to 60%. Wall motion was normal; there were no regional wall motion abnormalities. - Left atrium: The atrium was mildly dilated. Volume/bsa, ES (1-plane Simpson&'s, A4C): 36.6 ml/m^2.  Cardiac cath 07/27/12:  1. Widely patent coronary arteries 2. Normal LV systolic function 3. Elevated LVEDP  Pt has uncontrolled DM and a BMI of 59, however, she has a L foot amputation that has dehisced.  If labs acceptable day of surgery, I anticipate pt can proceed with surgery as scheduled.   [name], FNP-BC [facility]/Anesthesiology Phone: [phone] 03/02/2018 11:43 AM

## 2018-03-03 ENCOUNTER — Telehealth (INDEPENDENT_AMBULATORY_CARE_PROVIDER_SITE_OTHER): Payer: Self-pay

## 2018-03-03 ENCOUNTER — Inpatient Hospital Stay (HOSPITAL_COMMUNITY): Payer: Medicaid Other | Admitting: Emergency Medicine

## 2018-03-03 ENCOUNTER — Other Ambulatory Visit: Payer: Self-pay

## 2018-03-03 ENCOUNTER — Inpatient Hospital Stay (HOSPITAL_COMMUNITY)
Admission: RE | Admit: 2018-03-03 | Discharge: 2018-03-06 | DRG: 475 | Disposition: A | Discharge status: Rehabilitation Facility | Payer: Medicaid Other | Source: Ambulatory Visit | Attending: Orthopedic Surgery | Admitting: Orthopedic Surgery

## 2018-03-03 ENCOUNTER — Encounter (HOSPITAL_COMMUNITY)
Admission: RE | Disposition: A | Discharge status: Rehabilitation Facility | Payer: Self-pay | Source: Ambulatory Visit | Attending: Orthopedic Surgery

## 2018-03-03 ENCOUNTER — Encounter (HOSPITAL_COMMUNITY): Payer: Self-pay

## 2018-03-03 DIAGNOSIS — G4733 Obstructive sleep apnea (adult) (pediatric): Secondary | ICD-10-CM | POA: Diagnosis present

## 2018-03-03 DIAGNOSIS — E1152 Type 2 diabetes mellitus with diabetic peripheral angiopathy with gangrene: Secondary | ICD-10-CM | POA: Diagnosis present

## 2018-03-03 DIAGNOSIS — K219 Gastro-esophageal reflux disease without esophagitis: Secondary | ICD-10-CM | POA: Diagnosis present

## 2018-03-03 DIAGNOSIS — E785 Hyperlipidemia, unspecified: Secondary | ICD-10-CM | POA: Diagnosis present

## 2018-03-03 DIAGNOSIS — I10 Essential (primary) hypertension: Secondary | ICD-10-CM | POA: Diagnosis present

## 2018-03-03 DIAGNOSIS — L97529 Non-pressure chronic ulcer of other part of left foot with unspecified severity: Secondary | ICD-10-CM | POA: Diagnosis present

## 2018-03-03 DIAGNOSIS — E1165 Type 2 diabetes mellitus with hyperglycemia: Secondary | ICD-10-CM | POA: Diagnosis not present

## 2018-03-03 DIAGNOSIS — J449 Chronic obstructive pulmonary disease, unspecified: Secondary | ICD-10-CM | POA: Diagnosis present

## 2018-03-03 DIAGNOSIS — Y835 Amputation of limb(s) as the cause of abnormal reaction of the patient, or of later complication, without mention of misadventure at the time of the procedure: Secondary | ICD-10-CM | POA: Diagnosis present

## 2018-03-03 DIAGNOSIS — Z89512 Acquired absence of left leg below knee: Secondary | ICD-10-CM

## 2018-03-03 DIAGNOSIS — G894 Chronic pain syndrome: Secondary | ICD-10-CM | POA: Diagnosis not present

## 2018-03-03 DIAGNOSIS — E11621 Type 2 diabetes mellitus with foot ulcer: Secondary | ICD-10-CM | POA: Diagnosis present

## 2018-03-03 DIAGNOSIS — Z87891 Personal history of nicotine dependence: Secondary | ICD-10-CM

## 2018-03-03 DIAGNOSIS — T8781 Dehiscence of amputation stump: Principal | ICD-10-CM

## 2018-03-03 DIAGNOSIS — K5903 Drug induced constipation: Secondary | ICD-10-CM | POA: Diagnosis not present

## 2018-03-03 DIAGNOSIS — Z89519 Acquired absence of unspecified leg below knee: Secondary | ICD-10-CM

## 2018-03-03 DIAGNOSIS — E1142 Type 2 diabetes mellitus with diabetic polyneuropathy: Secondary | ICD-10-CM | POA: Diagnosis present

## 2018-03-03 DIAGNOSIS — D72829 Elevated white blood cell count, unspecified: Secondary | ICD-10-CM | POA: Diagnosis not present

## 2018-03-03 DIAGNOSIS — M86172 Other acute osteomyelitis, left ankle and foot: Secondary | ICD-10-CM | POA: Diagnosis not present

## 2018-03-03 DIAGNOSIS — J45909 Unspecified asthma, uncomplicated: Secondary | ICD-10-CM | POA: Diagnosis not present

## 2018-03-03 DIAGNOSIS — Z4781 Encounter for orthopedic aftercare following surgical amputation: Secondary | ICD-10-CM | POA: Diagnosis not present

## 2018-03-03 DIAGNOSIS — E669 Obesity, unspecified: Secondary | ICD-10-CM | POA: Diagnosis not present

## 2018-03-03 DIAGNOSIS — R0989 Other specified symptoms and signs involving the circulatory and respiratory systems: Secondary | ICD-10-CM | POA: Diagnosis not present

## 2018-03-03 DIAGNOSIS — Z6841 Body Mass Index (BMI) 40.0 and over, adult: Secondary | ICD-10-CM

## 2018-03-03 DIAGNOSIS — G8929 Other chronic pain: Secondary | ICD-10-CM | POA: Diagnosis present

## 2018-03-03 DIAGNOSIS — S88112S Complete traumatic amputation at level between knee and ankle, left lower leg, sequela: Secondary | ICD-10-CM | POA: Diagnosis not present

## 2018-03-03 DIAGNOSIS — G2581 Restless legs syndrome: Secondary | ICD-10-CM | POA: Diagnosis present

## 2018-03-03 DIAGNOSIS — D62 Acute posthemorrhagic anemia: Secondary | ICD-10-CM | POA: Diagnosis not present

## 2018-03-03 DIAGNOSIS — J9620 Acute and chronic respiratory failure, unspecified whether with hypoxia or hypercapnia: Secondary | ICD-10-CM | POA: Diagnosis not present

## 2018-03-03 DIAGNOSIS — E876 Hypokalemia: Secondary | ICD-10-CM | POA: Diagnosis not present

## 2018-03-03 DIAGNOSIS — M792 Neuralgia and neuritis, unspecified: Secondary | ICD-10-CM | POA: Diagnosis not present

## 2018-03-03 DIAGNOSIS — G8918 Other acute postprocedural pain: Secondary | ICD-10-CM | POA: Diagnosis not present

## 2018-03-03 HISTORY — PX: AMPUTATION: SHX166

## 2018-03-03 LAB — BASIC METABOLIC PANEL
Anion gap: 11 (ref 5–15)
BUN: 22 mg/dL — AB (ref 6–20)
CHLORIDE: 100 mmol/L — AB (ref 101–111)
CO2: 21 mmol/L — ABNORMAL LOW (ref 22–32)
Calcium: 8.7 mg/dL — ABNORMAL LOW (ref 8.9–10.3)
Creatinine, Ser: 0.98 mg/dL (ref 0.44–1.00)
Glucose, Bld: 359 mg/dL — ABNORMAL HIGH (ref 65–99)
POTASSIUM: 3.7 mmol/L (ref 3.5–5.1)
SODIUM: 132 mmol/L — AB (ref 135–145)

## 2018-03-03 LAB — POCT PREGNANCY, URINE: Preg Test, Ur: NEGATIVE

## 2018-03-03 LAB — GLUCOSE, CAPILLARY
GLUCOSE-CAPILLARY: 342 mg/dL — AB (ref 65–99)
GLUCOSE-CAPILLARY: 299 mg/dL — AB (ref 65–99)
GLUCOSE-CAPILLARY: 207 mg/dL — AB (ref 65–99)
GLUCOSE-CAPILLARY: 275 mg/dL — AB (ref 65–99)

## 2018-03-03 LAB — CBC
HEMATOCRIT: 33.8 % — AB (ref 36.0–46.0)
Hemoglobin: 11.3 g/dL — ABNORMAL LOW (ref 12.0–15.0)
MCH: 29 pg (ref 26.0–34.0)
MCHC: 33.4 g/dL (ref 30.0–36.0)
MCV: 86.9 fL (ref 78.0–100.0)
PLATELETS: 369 10*3/uL (ref 150–400)
RBC: 3.89 MIL/uL (ref 3.87–5.11)
RDW: 13.2 % (ref 11.5–15.5)
WBC: 11.1 10*3/uL — AB (ref 4.0–10.5)

## 2018-03-03 SURGERY — AMPUTATION BELOW KNEE
Anesthesia: General | Site: Leg Lower | Laterality: Left

## 2018-03-03 MED ORDER — OXYCODONE HCL 5 MG PO TABS
5.0000 mg | ORAL_TABLET | Freq: Once | ORAL | Status: AC | PRN
  Administered 2018-03-03: 5 mg via ORAL

## 2018-03-03 MED ORDER — ASPIRIN 325 MG PO TABS
325.0000 mg | ORAL_TABLET | Freq: Every day | ORAL | Status: DC
  Administered 2018-03-03 – 2018-03-06 (×4): 325 mg via ORAL
  Filled 2018-03-03 (×4): qty 1

## 2018-03-03 MED ORDER — ACETAMINOPHEN 325 MG PO TABS
325.0000 mg | ORAL_TABLET | Freq: Four times a day (QID) | ORAL | Status: DC | PRN
  Filled 2018-03-03: qty 2

## 2018-03-03 MED ORDER — ROPIVACAINE HCL 5 MG/ML IJ SOLN
INTRAMUSCULAR | Status: DC | PRN
  Administered 2018-03-03: 30 mL via PERINEURAL

## 2018-03-03 MED ORDER — METHOCARBAMOL 1000 MG/10ML IJ SOLN
500.0000 mg | Freq: Four times a day (QID) | INTRAVENOUS | Status: DC | PRN
  Filled 2018-03-03: qty 5

## 2018-03-03 MED ORDER — PROMETHAZINE HCL 25 MG/ML IJ SOLN
6.2500 mg | INTRAMUSCULAR | Status: DC | PRN
  Administered 2018-03-03: 12.5 mg via INTRAVENOUS

## 2018-03-03 MED ORDER — HYDROMORPHONE HCL 1 MG/ML IJ SOLN
INTRAMUSCULAR | Status: AC
  Filled 2018-03-03: qty 1

## 2018-03-03 MED ORDER — METOCLOPRAMIDE HCL 5 MG PO TABS: 5.0000 mg | ORAL_TABLET | Freq: Three times a day (TID) | ORAL | Status: DC | PRN

## 2018-03-03 MED ORDER — METHOCARBAMOL 500 MG PO TABS
ORAL_TABLET | ORAL | Status: AC
  Filled 2018-03-03: qty 1

## 2018-03-03 MED ORDER — MOMETASONE FURO-FORMOTEROL FUM 200-5 MCG/ACT IN AERO
2.0000 | INHALATION_SPRAY | Freq: Two times a day (BID) | RESPIRATORY_TRACT | Status: DC
  Filled 2018-03-03: qty 8.8

## 2018-03-03 MED ORDER — FENTANYL CITRATE (PF) 100 MCG/2ML IJ SOLN
100.0000 ug | Freq: Once | INTRAMUSCULAR | Status: AC
  Administered 2018-03-03: 100 ug via INTRAVENOUS

## 2018-03-03 MED ORDER — INSULIN ASPART 100 UNIT/ML ~~LOC~~ SOLN
6.0000 [IU] | Freq: Three times a day (TID) | SUBCUTANEOUS | Status: DC
  Administered 2018-03-03 – 2018-03-06 (×9): 6 [IU] via SUBCUTANEOUS

## 2018-03-03 MED ORDER — LIDOCAINE 2% (20 MG/ML) 5 ML SYRINGE
INTRAMUSCULAR | Status: DC | PRN
  Administered 2018-03-03: 100 mg via INTRAVENOUS

## 2018-03-03 MED ORDER — PHENYLEPHRINE 40 MCG/ML (10ML) SYRINGE FOR IV PUSH (FOR BLOOD PRESSURE SUPPORT)
PREFILLED_SYRINGE | INTRAVENOUS | Status: DC | PRN
  Administered 2018-03-03: 80 ug via INTRAVENOUS

## 2018-03-03 MED ORDER — ALBUTEROL SULFATE (2.5 MG/3ML) 0.083% IN NEBU: 2.5000 mg | INHALATION_SOLUTION | Freq: Four times a day (QID) | RESPIRATORY_TRACT | Status: DC | PRN

## 2018-03-03 MED ORDER — PANTOPRAZOLE SODIUM 40 MG PO TBEC
40.0000 mg | DELAYED_RELEASE_TABLET | Freq: Every day | ORAL | Status: DC
  Administered 2018-03-04 – 2018-03-06 (×3): 40 mg via ORAL
  Filled 2018-03-03 (×3): qty 1

## 2018-03-03 MED ORDER — PROMETHAZINE HCL 25 MG/ML IJ SOLN
INTRAMUSCULAR | Status: AC
  Filled 2018-03-03: qty 1

## 2018-03-03 MED ORDER — BISACODYL 10 MG RE SUPP: 10.0000 mg | Freq: Every day | RECTAL | Status: DC | PRN

## 2018-03-03 MED ORDER — 0.9 % SODIUM CHLORIDE (POUR BTL) OPTIME
TOPICAL | Status: DC | PRN
  Administered 2018-03-03: 1000 mL

## 2018-03-03 MED ORDER — MIDAZOLAM HCL 2 MG/2ML IJ SOLN
2.0000 mg | Freq: Once | INTRAMUSCULAR | Status: AC
  Administered 2018-03-03: 2 mg via INTRAVENOUS

## 2018-03-03 MED ORDER — ATORVASTATIN CALCIUM 20 MG PO TABS
20.0000 mg | ORAL_TABLET | Freq: Every day | ORAL | Status: DC
  Administered 2018-03-03 – 2018-03-05 (×3): 20 mg via ORAL
  Filled 2018-03-03 (×3): qty 1

## 2018-03-03 MED ORDER — MAGNESIUM CITRATE PO SOLN
1.0000 | Freq: Once | ORAL | Status: AC | PRN
  Administered 2018-03-06: 1 via ORAL
  Filled 2018-03-03: qty 296

## 2018-03-03 MED ORDER — CHLORHEXIDINE GLUCONATE 4 % EX LIQD: 60.0000 mL | Freq: Once | CUTANEOUS | Status: DC

## 2018-03-03 MED ORDER — INSULIN GLARGINE 100 UNIT/ML ~~LOC~~ SOLN
42.0000 [IU] | Freq: Every morning | SUBCUTANEOUS | Status: DC
Start: 2018-03-04 — End: 2018-03-06
  Administered 2018-03-04 – 2018-03-06 (×3): 42 [IU] via SUBCUTANEOUS
  Filled 2018-03-03 (×3): qty 0.42

## 2018-03-03 MED ORDER — GABAPENTIN 600 MG PO TABS
1200.0000 mg | ORAL_TABLET | Freq: Three times a day (TID) | ORAL | Status: DC
  Administered 2018-03-03 – 2018-03-06 (×10): 1200 mg via ORAL
  Filled 2018-03-03 (×9): qty 2

## 2018-03-03 MED ORDER — HYDROMORPHONE HCL 1 MG/ML IJ SOLN
INTRAMUSCULAR | Status: AC
  Administered 2018-03-04: 1 mg via INTRAVENOUS
  Filled 2018-03-03: qty 1

## 2018-03-03 MED ORDER — ONDANSETRON HCL 4 MG PO TABS: 4.0000 mg | ORAL_TABLET | Freq: Four times a day (QID) | ORAL | Status: DC | PRN

## 2018-03-03 MED ORDER — HYDROMORPHONE HCL 1 MG/ML IJ SOLN
0.5000 mg | INTRAMUSCULAR | Status: DC | PRN
  Administered 2018-03-04 – 2018-03-06 (×9): 1 mg via INTRAVENOUS
  Filled 2018-03-03 (×10): qty 1

## 2018-03-03 MED ORDER — ONDANSETRON HCL 4 MG/2ML IJ SOLN
4.0000 mg | Freq: Four times a day (QID) | INTRAMUSCULAR | Status: DC | PRN
  Administered 2018-03-04 – 2018-03-05 (×4): 4 mg via INTRAVENOUS
  Filled 2018-03-03 (×4): qty 2

## 2018-03-03 MED ORDER — FENTANYL CITRATE (PF) 250 MCG/5ML IJ SOLN
INTRAMUSCULAR | Status: AC
  Filled 2018-03-03: qty 5

## 2018-03-03 MED ORDER — CARVEDILOL 3.125 MG PO TABS
3.1250 mg | ORAL_TABLET | Freq: Two times a day (BID) | ORAL | Status: DC
  Administered 2018-03-03: 3.125 mg via ORAL
  Filled 2018-03-03 (×3): qty 1

## 2018-03-03 MED ORDER — INSULIN ASPART 100 UNIT/ML ~~LOC~~ SOLN
0.0000 [IU] | Freq: Three times a day (TID) | SUBCUTANEOUS | Status: DC
  Administered 2018-03-03: 7 [IU] via SUBCUTANEOUS
  Administered 2018-03-03 – 2018-03-04 (×2): 11 [IU] via SUBCUTANEOUS
  Administered 2018-03-04: 15 [IU] via SUBCUTANEOUS
  Administered 2018-03-04 – 2018-03-06 (×6): 11 [IU] via SUBCUTANEOUS

## 2018-03-03 MED ORDER — OXYCODONE HCL 5 MG PO TABS
ORAL_TABLET | ORAL | Status: AC
  Administered 2018-03-04: 15 mg via ORAL
  Filled 2018-03-03: qty 1

## 2018-03-03 MED ORDER — PROPOFOL 10 MG/ML IV BOLUS
INTRAVENOUS | Status: DC | PRN
  Administered 2018-03-03: 200 mg via INTRAVENOUS

## 2018-03-03 MED ORDER — FENTANYL CITRATE (PF) 100 MCG/2ML IJ SOLN
INTRAMUSCULAR | Status: AC
  Administered 2018-03-03: 100 ug via INTRAVENOUS
  Filled 2018-03-03: qty 2

## 2018-03-03 MED ORDER — MIDAZOLAM HCL 5 MG/5ML IJ SOLN
INTRAMUSCULAR | Status: DC | PRN
  Administered 2018-03-03: 2 mg via INTRAVENOUS

## 2018-03-03 MED ORDER — OXYCODONE HCL 5 MG PO TABS
10.0000 mg | ORAL_TABLET | ORAL | Status: DC | PRN
  Administered 2018-03-03 – 2018-03-06 (×11): 15 mg via ORAL
  Filled 2018-03-03 (×11): qty 3

## 2018-03-03 MED ORDER — FENTANYL CITRATE (PF) 250 MCG/5ML IJ SOLN
INTRAMUSCULAR | Status: DC | PRN
  Administered 2018-03-03 (×2): 25 ug via INTRAVENOUS

## 2018-03-03 MED ORDER — SODIUM CHLORIDE 0.9 % IV SOLN
INTRAVENOUS | Status: DC
  Administered 2018-03-03: 18:00:00 via INTRAVENOUS

## 2018-03-03 MED ORDER — HYDROMORPHONE HCL 1 MG/ML IJ SOLN
0.2500 mg | INTRAMUSCULAR | Status: DC | PRN
  Administered 2018-03-03 (×2): 0.5 mg via INTRAVENOUS

## 2018-03-03 MED ORDER — POLYETHYLENE GLYCOL 3350 17 G PO PACK: 17.0000 g | PACK | Freq: Every day | ORAL | Status: DC | PRN

## 2018-03-03 MED ORDER — MIDAZOLAM HCL 2 MG/2ML IJ SOLN
INTRAMUSCULAR | Status: AC
  Filled 2018-03-03: qty 2

## 2018-03-03 MED ORDER — METOCLOPRAMIDE HCL 5 MG/ML IJ SOLN
5.0000 mg | Freq: Three times a day (TID) | INTRAMUSCULAR | Status: DC | PRN
  Administered 2018-03-05 – 2018-03-06 (×2): 10 mg via INTRAVENOUS
  Filled 2018-03-03 (×2): qty 2

## 2018-03-03 MED ORDER — OXYCODONE HCL 5 MG PO TABS
5.0000 mg | ORAL_TABLET | ORAL | Status: DC | PRN
  Administered 2018-03-03: 5 mg via ORAL
  Administered 2018-03-03 – 2018-03-05 (×2): 10 mg via ORAL
  Filled 2018-03-03: qty 1
  Filled 2018-03-03: qty 2

## 2018-03-03 MED ORDER — ONDANSETRON HCL 4 MG/2ML IJ SOLN
INTRAMUSCULAR | Status: DC | PRN
  Administered 2018-03-03: 4 mg via INTRAVENOUS

## 2018-03-03 MED ORDER — INSULIN DEGLUDEC 200 UNIT/ML ~~LOC~~ SOPN: 42.0000 [IU] | PEN_INJECTOR | Freq: Every day | SUBCUTANEOUS | Status: DC

## 2018-03-03 MED ORDER — METHOCARBAMOL 500 MG PO TABS
500.0000 mg | ORAL_TABLET | Freq: Four times a day (QID) | ORAL | Status: DC | PRN
  Administered 2018-03-03 – 2018-03-06 (×7): 500 mg via ORAL
  Filled 2018-03-03 (×7): qty 1

## 2018-03-03 MED ORDER — LIRAGLUTIDE 18 MG/3ML ~~LOC~~ SOPN: 1.8000 mg | PEN_INJECTOR | Freq: Every morning | SUBCUTANEOUS | Status: DC

## 2018-03-03 MED ORDER — LACTATED RINGERS IV SOLN
INTRAVENOUS | Status: DC
  Administered 2018-03-03: 08:00:00 via INTRAVENOUS

## 2018-03-03 MED ORDER — MIDAZOLAM HCL 2 MG/2ML IJ SOLN
INTRAMUSCULAR | Status: AC
Start: 2018-03-03 — End: 2018-03-03
  Administered 2018-03-03: 2 mg via INTRAVENOUS
  Filled 2018-03-03: qty 2

## 2018-03-03 MED ORDER — OXYCODONE HCL 5 MG PO TABS
ORAL_TABLET | ORAL | Status: AC
  Administered 2018-03-04: 15 mg via ORAL
  Filled 2018-03-03: qty 2

## 2018-03-03 MED ORDER — INSULIN ASPART 100 UNIT/ML ~~LOC~~ SOLN
10.0000 [IU] | Freq: Once | SUBCUTANEOUS | Status: AC
  Administered 2018-03-03: 10 [IU] via SUBCUTANEOUS

## 2018-03-03 MED ORDER — OXYCODONE HCL 5 MG/5ML PO SOLN: 5.0000 mg | Freq: Once | ORAL | Status: AC | PRN

## 2018-03-03 MED ORDER — CEFAZOLIN SODIUM-DEXTROSE 2-4 GM/100ML-% IV SOLN
2.0000 g | Freq: Four times a day (QID) | INTRAVENOUS | Status: AC
  Administered 2018-03-03 – 2018-03-04 (×2): 2 g via INTRAVENOUS
  Filled 2018-03-03 (×4): qty 100

## 2018-03-03 MED ORDER — DOCUSATE SODIUM 100 MG PO CAPS
100.0000 mg | ORAL_CAPSULE | Freq: Two times a day (BID) | ORAL | Status: DC
  Administered 2018-03-03 – 2018-03-06 (×7): 100 mg via ORAL
  Filled 2018-03-03 (×7): qty 1

## 2018-03-03 MED ORDER — CARVEDILOL 3.125 MG PO TABS
3.1250 mg | ORAL_TABLET | Freq: Once | ORAL | Status: AC
  Administered 2018-03-03: 3.125 mg via ORAL
  Filled 2018-03-03 (×2): qty 1

## 2018-03-03 MED ORDER — NITROGLYCERIN 0.2 MG/HR TD PT24
0.2000 mg | MEDICATED_PATCH | Freq: Every day | TRANSDERMAL | Status: DC
  Filled 2018-03-03 (×3): qty 1

## 2018-03-03 SURGICAL SUPPLY — 36 items
APL SKNCLS STERI-STRIP NONHPOA (GAUZE/BANDAGES/DRESSINGS) ×1
BENZOIN TINCTURE PRP APPL 2/3 (GAUZE/BANDAGES/DRESSINGS) ×1 IMPLANT
BLADE SAW RECIP 87.9 MT (BLADE) ×2 IMPLANT
BLADE SURG 21 STRL SS (BLADE) ×2 IMPLANT
BNDG COHESIVE 6X5 TAN STRL LF (GAUZE/BANDAGES/DRESSINGS) ×2 IMPLANT
BNDG GAUZE ELAST 4 BULKY (GAUZE/BANDAGES/DRESSINGS) ×2 IMPLANT
CANISTER WOUND CARE 500ML ATS (WOUND CARE) ×1 IMPLANT
COVER SURGICAL LIGHT HANDLE (MISCELLANEOUS) ×2 IMPLANT
CUFF TOURNIQUET SINGLE 34IN LL (TOURNIQUET CUFF) ×1 IMPLANT
CUFF TOURNIQUET SINGLE 44IN (TOURNIQUET CUFF) IMPLANT
DRAPE INCISE IOBAN 66X45 STRL (DRAPES) ×1 IMPLANT
DRAPE U-SHAPE 47X51 STRL (DRAPES) ×2 IMPLANT
DRESSING PREVENA PLUS CUSTOM (GAUZE/BANDAGES/DRESSINGS) ×1 IMPLANT
DRSG PREVENA PLUS CUSTOM (GAUZE/BANDAGES/DRESSINGS) ×2
ELECT REM PT RETURN 9FT ADLT (ELECTROSURGICAL) ×2
ELECTRODE REM PT RTRN 9FT ADLT (ELECTROSURGICAL) ×1 IMPLANT
GLOVE BIOGEL PI IND STRL 9 (GLOVE) ×1 IMPLANT
GLOVE BIOGEL PI INDICATOR 9 (GLOVE) ×1
GLOVE SURG ORTHO 9.0 STRL STRW (GLOVE) ×2 IMPLANT
GOWN STRL REUS W/ TWL XL LVL3 (GOWN DISPOSABLE) ×2 IMPLANT
GOWN STRL REUS W/TWL XL LVL3 (GOWN DISPOSABLE) ×4
KIT BASIN OR (CUSTOM PROCEDURE TRAY) ×2 IMPLANT
KIT DRSG PREVENA PLUS 7DAY 125 (MISCELLANEOUS) ×1 IMPLANT
KIT TURNOVER KIT B (KITS) ×2 IMPLANT
MANIFOLD NEPTUNE II (INSTRUMENTS) ×1 IMPLANT
NS IRRIG 1000ML POUR BTL (IV SOLUTION) ×2 IMPLANT
PACK ORTHO EXTREMITY (CUSTOM PROCEDURE TRAY) ×2 IMPLANT
PAD ARMBOARD 7.5X6 YLW CONV (MISCELLANEOUS) ×2 IMPLANT
SPONGE LAP 18X18 X RAY DECT (DISPOSABLE) IMPLANT
STAPLER VISISTAT 35W (STAPLE) IMPLANT
STOCKINETTE IMPERVIOUS LG (DRAPES) ×2 IMPLANT
SUT SILK 2 0 (SUTURE) ×2
SUT SILK 2-0 18XBRD TIE 12 (SUTURE) ×1 IMPLANT
SUT VIC AB 1 CTX 27 (SUTURE) IMPLANT
TOWEL OR 17X26 10 PK STRL BLUE (TOWEL DISPOSABLE) ×2 IMPLANT
YANKAUER SUCT BULB TIP NO VENT (SUCTIONS) ×1 IMPLANT

## 2018-03-03 NOTE — Progress Notes (Signed)
Orthopedic Tech Progress Note Patient Details:  [name] 05-16-1976 [id number] stump shrinker and protector  Ortho Devices Type of Ortho Device: Other (comment) Ortho Device/Splint Location: [employer] called for stump shrinker and protector. Ortho Device/Splint Interventions: [name] 03/03/2018, 2:51 PM

## 2018-03-03 NOTE — Telephone Encounter (Signed)
Sharyn Lull, nurse at [facility] hospital left a VM stating that patients family is requesting to speak with Dr. Sharol Given, stating that they have some questions for him.  Patient had surgery today, for Left BKA.  Cb# is [phone].  Please advise.  Thank you.

## 2018-03-03 NOTE — Anesthesia Procedure Notes (Signed)
Anesthesia Regional Block: Popliteal block   Pre-Anesthetic Checklist: ,, timeout performed, Correct Patient, Correct Site, Correct Laterality, Correct Procedure, Correct Position, site marked, Risks and benefits discussed,  Surgical consent,  Pre-op evaluation,  At surgeon's request and post-op pain management  Laterality: Left  Prep: chloraprep       Needles:  Injection technique: Single-shot  Needle Type: Stimiplex     Needle Length: 9cm  Needle Gauge: 21     Additional Needles:   Procedures:,,,, ultrasound used (permanent image in chart),,,,  Narrative:  Start time: 03/03/2018 8:55 AM End time: 03/03/2018 9:00 AM Injection made incrementally with aspirations every 5 mL.  Performed by: Personally  Anesthesiologist: Lynda Rainwater, MD

## 2018-03-03 NOTE — Anesthesia Procedure Notes (Signed)
Procedure Name: LMA Insertion Date/Time: 03/03/2018 9:49 AM Performed by: Myna Bright, CRNA Pre-anesthesia Checklist: Patient identified, Emergency Drugs available, Suction available and Patient being monitored Patient Re-evaluated:Patient Re-evaluated prior to induction Oxygen Delivery Method: Circle system utilized Preoxygenation: Pre-oxygenation with 100% oxygen Induction Type: IV induction Ventilation: Mask ventilation without difficulty LMA: LMA inserted LMA Size: 4.0 Tube type: Oral Number of attempts: 1 Placement Confirmation: breath sounds checked- equal and bilateral and positive ETCO2 Tube secured with: Tape Dental Injury: Teeth and Oropharynx as per pre-operative assessment

## 2018-03-03 NOTE — Anesthesia Postprocedure Evaluation (Signed)
Anesthesia Post Note  Patient: [name]  Procedure(s) Performed: LEFT BELOW KNEE AMPUTATION (Left Leg Lower)     Patient location during evaluation: PACU Anesthesia Type: General Level of consciousness: awake and alert Pain management: pain level controlled Vital Signs Assessment: post-procedure vital signs reviewed and stable Respiratory status: spontaneous breathing, nonlabored ventilation and respiratory function stable Cardiovascular status: blood pressure returned to baseline and stable Postop Assessment: no apparent nausea or vomiting Anesthetic complications: no    Last Vitals:  Vitals:   03/03/18 1100 03/03/18 1125  BP: 127/85 132/65  Pulse: 83 80  Resp: 12 12  Temp:    SpO2: 96% 92%    Last Pain:  Vitals:   03/03/18 1100  TempSrc:   PainSc: Asleep                 Lynda Rainwater

## 2018-03-03 NOTE — Op Note (Signed)
   Date of Surgery: 03/03/2018  INDICATIONS: [name] is a 43 y.o.-year-old female who presents status post fourth and fifth ray amputations left foot who has had wound dehiscence necrosis gangrene and presents at this time for transtibial amputation.Marland Kitchen  PREOPERATIVE DIAGNOSIS: Gangrene abscess ulceration left foot  POSTOPERATIVE DIAGNOSIS: Same.  PROCEDURE: Transtibial amputation Application of Prevena wound VAC  SURGEON: Sharol Given, M.D.  ANESTHESIA:  general  IV FLUIDS AND URINE: See anesthesia.  ESTIMATED BLOOD LOSS: 200 mL.  COMPLICATIONS: None.  DESCRIPTION OF PROCEDURE: The patient was brought to the operating room and underwent a general anesthetic. After adequate levels of anesthesia were obtained patient's lower extremity was prepped using DuraPrep draped into a sterile field. A timeout was called. The foot was draped out of the sterile field with impervious stockinette. A transverse incision was made 11 cm distal to the tibial tubercle. This curved proximally and a large posterior flap was created. The tibia was transected 1 cm proximal to the skin incision. The fibula was transected just proximal to the tibial incision. The tibia was beveled anteriorly. A large posterior flap was created. The sciatic nerve was pulled cut and allowed to retract. The vascular bundles were suture ligated with 2-0 silk. The deep and superficial fascial layers were closed using #1 Vicryl. The skin was closed using staples and 2-0 nylon. The wound was covered with a Prevena wound VAC. There was a good suction fit. A prosthetic shrinker was applied. Patient was extubated taken to the PACU in stable condition.   DISCHARGE PLANNING:  Antibiotic duration: 24 hours perioperatively  Weightbearing: Nonweightbearing on the left  Pain medication: As ordered  Dressing care/ Wound VAC: Continue wound VAC for 1 week  Discharge to: Inpatient versus outpatient rehabilitation.  Follow-up: In the office 1 week  post operative.  Meridee Score, MD [place] 10:17 AM

## 2018-03-03 NOTE — Transfer of Care (Signed)
Immediate Anesthesia Transfer of Care Note  Patient: [name]  Procedure(s) Performed: LEFT BELOW KNEE AMPUTATION (Left Leg Lower)  Patient Location: PACU  Anesthesia Type:GA combined with regional for post-op pain  Level of Consciousness: awake, alert , oriented and patient cooperative  Airway & Oxygen Therapy: Patient Spontanous Breathing and Patient connected to nasal cannula oxygen  Post-op Assessment: Report given to RN, Post -op Vital signs reviewed and stable and Patient moving all extremities  Post vital signs: Reviewed and stable  Last Vitals:  Vitals Value Taken Time  BP    Temp    Pulse 89 03/03/2018 10:23 AM  Resp 15 03/03/2018 10:23 AM  SpO2 95 % 03/03/2018 10:23 AM  Vitals shown include unvalidated device data.  Last Pain:  Vitals:   03/03/18 0905  TempSrc:   PainSc: 0-No pain      Patients Stated Pain Goal: 3 (54/65/03 5465)  Complications: No apparent anesthesia complications

## 2018-03-03 NOTE — H&P (Signed)
[name] is an 42 y.o. female.   Chief Complaint: Dehiscence fourth and fifth ray amputation left foot. HPI: Patient is a 42 year old woman who presents in follow-up status post fourth and fifth ray amputations of the left foot.  Patient states she is having progressive wound dehiscence.  Denies any systemic symptoms.    Past Medical History:  Diagnosis Date  . Acute osteomyelitis of ankle or foot, left ([place]) 01/31/2018  . Alveolar hypoventilation   . Anemia    not on iron pill  . Arthritis   . Asthma   . Bipolar 2 disorder ([place])   . Carpal tunnel syndrome on right    recurrent  . Cellulitis 08/2010-08/2011  . Cellulitis of left foot 01/27/2018  . Chronic pain   . COPD (chronic obstructive pulmonary disease) ([name])    Symbicort daily and Proventil as needed  . Costochondritis   . Depression   . Diabetes mellitus 2000   Type 2, Uncontrolled.Takes Lantus daily.Fasting blood sugar runs 150  . Dizziness    occasionally  . Drug-seeking behavior   . GERD (gastroesophageal reflux disease)    takes Pantoprazole and Zantac daily  . Headache    migraine-last one about a yr ago.Topamax daily  . HLD (hyperlipidemia)    takes Atorvastatin daily  . Hypertension    takes Lisinopril and Coreg daily  . Morbid obesity ([place])   . Muscle spasm    takes Flexeril as needed  . Nocturia   . Obstructive sleep apnea   . Peripheral neuropathy    takes Gabapentin daily  . Pneumonia    "walking" several yrs ago and as a baby  . Rectal fissure   . Restless leg   . Urinary frequency   . Varicose veins    Right medial thigh and Left leg     Past Surgical History:  Procedure Laterality Date  . AMPUTATION Left 02/01/2018   Procedure: LEFT FOURTH AND 5TH TOE RAY AMPUTATION;  Surgeon: [name], MD;  Location: [facility];  Service: Orthopedics;  Laterality: Left;  . CARPAL TUNNEL RELEASE Bilateral   . CESAREAN SECTION    . KNEE ARTHROSCOPY Right 07/17/2010  . LEFT HEART CATHETERIZATION WITH  CORONARY ANGIOGRAM N/A 07/27/2012   Procedure: LEFT HEART CATHETERIZATION WITH CORONARY ANGIOGRAM;  Surgeon: Sherren Mocha, MD;  Location: [facility] CATH LAB;  Service: Cardiovascular;  Laterality: N/A;  . left knee surgery     screws she thinks  . MASS EXCISION N/A 06/29/2013   Procedure:  WIDE LOCAL EXCISION OF POSTERIOR NECK ABSCESS;  Surgeon: Ralene Ok, MD;  Location: [facility];  Service: General;  Laterality: N/A;    Family History  Problem Relation Age of Onset  . Diabetes Mother   . Hyperlipidemia Mother   . Depression Mother   . Varicose Veins Mother   . Heart attack Paternal Uncle   . Heart disease Paternal Grandmother   . Heart attack Paternal Grandmother   . Heart attack Paternal Grandfather   . Heart disease Paternal Grandfather   . Heart attack Father   . Cancer Maternal Grandmother        COLON  . Hypertension Maternal Grandmother   . Hyperlipidemia Maternal Grandmother   . Diabetes Maternal Grandmother   . Other Maternal Grandfather        GUN SHOT   Social History:  reports that she quit smoking about 24 years ago. Her smoking use included cigarettes. She has a 0.09 pack-year smoking history. She has never used  smokeless tobacco. She reports that she has current or past drug history. She reports that she does not drink alcohol.  Allergies:  Allergies  Allergen Reactions  . Kiwi Extract Shortness Of Breath and Swelling  . Trental [Pentoxifylline] Nausea And Vomiting  . Nubain [Nalbuphine Hcl] Other (See Comments)    "FEELS LIKE SOMETHING CRAWLING ON ME"    No medications prior to admission.    No results found for this or any previous visit (from the past 48 hour(s)). No results found.  Review of Systems  All other systems reviewed and are negative.   Last menstrual period 02/17/2018. Physical Exam  Patient is alert, oriented, no adenopathy, well-dressed, normal affect, normal respiratory effort. Examination patient has progressive wound dehiscence with  necrosis along the wound edges.  She has a good dorsalis pedis and posterior tibial pulse she has good hair growth down the ankle her problem seems to be primarily microcirculatory with poor control of the diabetes.   Assessment/Plan 1. Dehiscence of amputation stump ([name])     Plan: Due to the progressive dehiscence of the wound patient will need to proceed with a transtibial amputation.  Risks and benefits of surgery were discussed.  We will call in a prescription for doxycycline she will start dry dressing changes daily and plan for surgery on Friday.  Discussed that she most likely would need outpatient rehabilitation but inpatient rehabilitation is an option.     [name], MD 03/03/2018, 6:38 AM

## 2018-03-03 NOTE — Progress Notes (Signed)
RT NOTE:  Pt has her own CPAP from home. RT provided sterile water for humidifier. Pt stated she will set up when she is ready for bed. Pt understands to call RT if needed.

## 2018-03-03 NOTE — Telephone Encounter (Signed)
Called and gave Dr. Jess Barters cell number as he is in the OR today can reach him that way.

## 2018-03-03 NOTE — Progress Notes (Signed)
[facility]  CONSULT SERVICE NOTE   Pt s/p left BKA today. Will follow up with formal consult once patient begins to mobilize with PT.   Meredith Staggers, MD, [facility] 03/03/2018

## 2018-03-03 NOTE — Anesthesia Preprocedure Evaluation (Signed)
Anesthesia Evaluation  Patient identified by MRN, date of birth, ID band Patient awake    Reviewed: Allergy & Precautions, NPO status , Patient's Chart, lab work & pertinent test results, reviewed documented beta blocker date and time   Airway Mallampati: III  TM Distance: >3 FB Neck ROM: Full    Dental  (+) Dental Advisory Given, Teeth Intact   Pulmonary asthma , sleep apnea and Continuous Positive Airway Pressure Ventilation , COPD, former smoker,    Pulmonary exam normal breath sounds clear to auscultation       Cardiovascular hypertension, Pt. on medications and Pt. on home beta blockers + Peripheral Vascular Disease  Normal cardiovascular exam Rhythm:Regular Rate:Normal  '18 TTE -  Ejection fraction was in the range of 55%   to 60%. Left atrium was mildly dilated.   Neuro/Psych  Headaches, PSYCHIATRIC DISORDERS Depression Bipolar Disorder Diabetic neuropathy  Neuromuscular disease    GI/Hepatic Neg liver ROS, GERD  Medicated and Controlled,  Endo/Other  diabetes, Poorly Controlled, Type 2, Insulin DependentMorbid obesity  Renal/GU Renal InsufficiencyRenal disease  negative genitourinary   Musculoskeletal  (+) Arthritis , Osteoarthritis,  Osteomyelitis left foot   Abdominal   Peds  Hematology  (+) anemia ,   Anesthesia Other Findings Hyponatremia, hypocalcemia; hx chronic pain and drug seeking behavior  Reproductive/Obstetrics                                                              Anesthesia Evaluation  Patient identified by MRN, date of birth, ID band Patient awake    Reviewed: Allergy & Precautions, H&P , NPO status , Patient's Chart, lab work & pertinent test results, reviewed documented beta blocker date and time   History of Anesthesia Complications Negative for: history of anesthetic complications  Airway Mallampati: III  Neck ROM: Limited    Dental  (+) Teeth  Intact and Dental Advisory Given   Pulmonary asthma , sleep apnea and Continuous Positive Airway Pressure Ventilation , COPD COPD inhaler,    + decreased breath sounds      Cardiovascular hypertension, Pt. on medications and Pt. on home beta blockers + angina Rhythm:Regular Rate:Normal  Cardiac cath 2013 - "clear per patient"   Neuro/Psych  Neuromuscular disease    GI/Hepatic Neg liver ROS, GERD-  Medicated and Controlled,  Endo/Other  diabetes, Type 2, Oral Hypoglycemic Agents and Insulin DependentMorbid obesity  Renal/GU negative Renal ROS     Musculoskeletal negative musculoskeletal ROS (+)   Abdominal (+) + obese,   Peds  Hematology negative hematology ROS (+)   Anesthesia Other Findings   Reproductive/Obstetrics negative OB ROS                          Anesthesia Physical Anesthesia Plan  ASA: IV  Anesthesia Plan: General   Post-op Pain Management:    Induction: Intravenous  Airway Management Planned: Oral ETT and Video Laryngoscope Planned  Additional Equipment:   Intra-op Plan:   Post-operative Plan: Extubation in OR and Possible Post-op intubation/ventilation  Informed Consent: I have reviewed the patients History and Physical, chart, labs and discussed the procedure including the risks, benefits and alternatives for the proposed anesthesia with the patient or authorized representative who has indicated his/her understanding and acceptance.  Dental advisory given  Plan Discussed with: CRNA and Surgeon  Anesthesia Plan Comments:         Anesthesia Quick Evaluation  Anesthesia Physical  Anesthesia Plan  ASA: III  Anesthesia Plan: General   Post-op Pain Management:  Regional for Post-op pain   Induction: Intravenous  PONV Risk Score and Plan: 4 or greater and Ondansetron, Treatment may vary due to age or medical condition, Dexamethasone and Midazolam  Airway Management Planned: LMA  Additional  Equipment:   Intra-op Plan:   Post-operative Plan: Extubation in OR  Informed Consent: I have reviewed the patients History and Physical, chart, labs and discussed the procedure including the risks, benefits and alternatives for the proposed anesthesia with the patient or authorized representative who has indicated his/her understanding and acceptance.   Dental advisory given  Plan Discussed with: CRNA  Anesthesia Plan Comments:         Anesthesia Quick Evaluation

## 2018-03-04 ENCOUNTER — Encounter (HOSPITAL_COMMUNITY): Payer: Self-pay | Admitting: Orthopedic Surgery

## 2018-03-04 LAB — GLUCOSE, CAPILLARY
GLUCOSE-CAPILLARY: 251 mg/dL — AB (ref 65–99)
GLUCOSE-CAPILLARY: 264 mg/dL — AB (ref 65–99)
Glucose-Capillary: 303 mg/dL — ABNORMAL HIGH (ref 65–99)
Glucose-Capillary: 291 mg/dL — ABNORMAL HIGH (ref 65–99)

## 2018-03-04 MED ORDER — OXYCODONE HCL ER 10 MG PO T12A
10.0000 mg | EXTENDED_RELEASE_TABLET | Freq: Two times a day (BID) | ORAL | Status: DC
  Administered 2018-03-04 – 2018-03-05 (×4): 10 mg via ORAL
  Filled 2018-03-04 (×4): qty 1

## 2018-03-04 NOTE — Progress Notes (Signed)
Pt stated she can place self on home cpap unit when ready for bed. RT will continue to monitor as needed.

## 2018-03-04 NOTE — Evaluation (Signed)
Physical Therapy Evaluation Patient Details Name: [name] MRN: [id number] DOB: 06/06/1976 Today's Date: 03/04/2018   History of Present Illness  42 y.o. 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 PMH includes: COPD, morbid obesity, HTN, drug seeking behavior, bipolar, left knee surgery.     Clinical Impression  Patient is s/p above surgery resulting in functional limitations due to the deficits listed below (see PT Problem List). PTA, patient recovering from toe amputation at home ambulating with RW. Pt lives with husband and daughter who are avail PRN with CNA coverage daily. Upon eval pt presents with high levels of pain, currently min A for transfers from bedside chair into bed. Patient eager to work with therapy, ambulation deferred secondary to pain. Patient will benefit from skilled PT to increase their independence and safety with mobility to allow discharge to the venue listed below.       Follow Up Recommendations CIR;Supervision/Assistance - 24 hour;Follow surgeon's recommendation for DC plan and follow-up therapies    Equipment Recommendations       Recommendations for Other Services       Precautions / Restrictions Precautions Precautions: Fall Restrictions Weight Bearing Restrictions: Yes LLE Weight Bearing: Non weight bearing      Mobility  Bed Mobility Overal bed mobility: Needs Assistance Bed Mobility: Sit to Supine       Sit to supine: Min assist      Transfers Overall transfer level: Needs assistance Equipment used: Rolling walker (2 wheeled) Transfers: Sit to/from [facility] Sit to Stand: Min assist Stand pivot transfers: Min assist       General transfer comment: Min A to power up and stabilize, patient able to tolerate static standing and transfer into bed from bedside chair, min A for stability and safety.   Ambulation/Gait             General Gait Details: unable due to pain   Stairs            Wheelchair Mobility     Modified Rankin (Stroke Patients Only)       Balance Overall balance assessment: Needs assistance   Sitting balance-Leahy Scale: Good       Standing balance-Leahy Scale: Fair                               Pertinent Vitals/Pain Pain Assessment: 0-10 Pain Score: 8  Pain Location: surgical site Pain Descriptors / Indicators: Discomfort;Grimacing;Operative site guarding Pain Intervention(s): Limited activity within patient's tolerance;Monitored during session;Premedicated before session;Repositioned    Home Living Family/patient expects to be discharged to:: Inpatient rehab Living Arrangements: Spouse/significant other;Children Available Help at Discharge: Family;Available PRN/intermittently Type of Home: House Home Access: Stairs to enter Entrance Stairs-Rails: None Entrance Stairs-Number of Steps: 2(Patient will be getting a ramp ) Home Layout: One level Home Equipment: Walker - 2 wheels      Prior Function Level of Independence: Needs assistance   Gait / Transfers Assistance Needed: household ambulation with RW  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  Comments: Patients husband has been helping with ADLs, working during day, CNA intermittent      Hand Dominance        Extremity/Trunk Assessment   Upper Extremity Assessment Upper Extremity Assessment: Overall WFL for tasks assessed    Lower Extremity Assessment Lower Extremity Assessment: (RLE 4/5, LLE 3/5 post op pain )    Cervical / Trunk Assessment Cervical / Trunk Assessment: Other exceptions(Large  body habitus)  Communication   Communication: No difficulties  Cognition Arousal/Alertness: Awake/alert Behavior During Therapy: WFL for tasks assessed/performed                                          General Comments      Exercises Amputee Exercises Hip ABduction/ADduction: 10 reps Knee Flexion: 10 reps Knee Extension: 10 reps   Assessment/Plan     PT Assessment Patient needs continued PT services  PT Problem List Decreased strength;Decreased range of motion;Decreased activity tolerance;Obesity;Pain;Decreased balance;Decreased mobility       PT Treatment Interventions DME instruction;Gait training;Stair training;Functional mobility training;Therapeutic exercise;Therapeutic activities;Balance training    PT Goals (Current goals can be found in the Care Plan section)  Acute Rehab PT Goals Patient Stated Goal: go to rehab PT Goal Formulation: With patient Time For Goal Achievement: 03/11/18 Potential to Achieve Goals: Good    Frequency Min 3X/week   Barriers to discharge        Co-evaluation               AM-PAC PT "6 Clicks" Daily Activity  Outcome Measure Difficulty turning over in bed (including adjusting bedclothes, sheets and blankets)?: A Little Difficulty moving from lying on back to sitting on the side of the bed? : A Little Difficulty sitting down on and standing up from a chair with arms (e.g., wheelchair, bedside commode, etc,.)?: A Little Help needed moving to and from a bed to chair (including a wheelchair)?: A Little Help needed walking in hospital room?: A Lot Help needed climbing 3-5 steps with a railing? : Total 6 Click Score: 15    End of Session Equipment Utilized During Treatment: Gait belt Activity Tolerance: Patient tolerated treatment well Patient left: in bed;with call bell/phone within reach;with family/visitor present Nurse Communication: Mobility status PT Visit Diagnosis: Unsteadiness on feet (R26.81);Other abnormalities of gait and mobility (R26.89);Muscle weakness (generalized) (M62.81);Pain Pain - Right/Left: Left Pain - part of body: Leg;Knee    Time: 1829-9371 PT Time Calculation (min) (ACUTE ONLY): 21 min   Charges:   PT Evaluation $PT Eval Low Complexity: 1 Low PT Treatments $Therapeutic Activity: 8-22 mins   PT G Codes:        Reinaldo Berber, PT, DPT Acute Rehab  Services Pager: [phone]    Reinaldo Berber 03/04/2018, 11:07 AM

## 2018-03-04 NOTE — Progress Notes (Signed)
Subjective: Patient having a lot of pain.  She was on Norco preoperatively.   Objective: Vital signs in last 24 hours: Temp:  [97 F (36.1 C)-98.6 F (37 C)] 98.6 F (37 C) (03/30 0823) Pulse Rate:  [80-108] 108 (03/30 0823) Resp:  [10-20] 20 (03/30 0823) BP: (82-146)/(40-106) 146/106 (03/30 0823) SpO2:  [92 %-97 %] 96 % (03/30 0823)  Intake/Output from previous day: 03/29 0701 - 03/30 0700 In: 1620 [P.O.:240; I.V.:780; IV Piggyback:600] Out: 100 [Blood:100] Intake/Output this shift: No intake/output data recorded.  Exam:  Stump dressing intact  Labs: Recent Labs    03/03/18 0750  HGB 11.3*   Recent Labs    03/03/18 0750  WBC 11.1*  RBC 3.89  HCT 33.8*  PLT 369   Recent Labs    03/03/18 0750  NA 132*  K 3.7  CL 100*  CO2 21*  BUN 22*  CREATININE 0.98  GLUCOSE 359*  CALCIUM 8.7*   No results for input(s): LABPT, INR in the last 72 hours.  Assessment/Plan: Plan is to mobilize as much as possible.  Patient is sitting in a chair today.  We will try to optimize pain medicine for less pain but she will not be in no pain   G Alphonzo Severance 03/04/2018, 10:13 AM

## 2018-03-04 NOTE — Progress Notes (Signed)
Patient was made aware that a family member will need to bring her Victoza  From home.  Nursing received a note from pharmacy regarding, unable to obtain from hospital pharmacy.  Patient states that she will have to notify her husband to bring.

## 2018-03-05 DIAGNOSIS — Z89512 Acquired absence of left leg below knee: Secondary | ICD-10-CM

## 2018-03-05 DIAGNOSIS — E1142 Type 2 diabetes mellitus with diabetic polyneuropathy: Secondary | ICD-10-CM

## 2018-03-05 LAB — GLUCOSE, CAPILLARY
GLUCOSE-CAPILLARY: 280 mg/dL — AB (ref 65–99)
GLUCOSE-CAPILLARY: 356 mg/dL — AB (ref 65–99)
Glucose-Capillary: 269 mg/dL — ABNORMAL HIGH (ref 65–99)
Glucose-Capillary: 270 mg/dL — ABNORMAL HIGH (ref 65–99)

## 2018-03-05 NOTE — Progress Notes (Signed)
Pt places self on cpap for the night. RT will continue to monitor as needed.

## 2018-03-05 NOTE — Plan of Care (Signed)
  Problem: Clinical Measurements: Goal: Will remain free from infection Outcome: Progressing Note:  Pt has not shown any signs of infection during my care.    Problem: Activity: Goal: Risk for activity intolerance will decrease Outcome: Progressing Note:  Pt is able to get to [facility] with the supervision of staff during my shift. Pt requires minimal assist with this task.    Problem: Coping: Goal: Level of anxiety will decrease Outcome: Progressing Note:  Pt shows signs of frustration with her current condition and it's progression to this point. Pt voiced her frustration with this RN about the decline in her health the past month. Pt also passively admits to not caring for herself as instructed by MD.    Problem: Pain Managment: Goal: General experience of comfort will improve Outcome: Progressing Note:  Pt has maintained a pain score of 7/10 during my care and has required PRN medications.    Problem: Safety: Goal: Ability to remain free from injury will improve Outcome: Progressing Note:  Pt has remained free from injury and has called appropriately when needing to get OOB during my shift.    Problem: Skin Integrity: Goal: Risk for impaired skin integrity will decrease Outcome: Progressing Note:  With the help of the trapeze, pt is able to move and reposition herself while in the bed. RN and pt have readjusted shrinker to lessen the irritation on her left lower extremity during my shift.

## 2018-03-05 NOTE — Evaluation (Addendum)
Occupational Therapy Evaluation Patient Details Name: [name] MRN: [id number] DOB: 01/12/1976 Today's Date: 03/05/2018    History of Present Illness 42 y.o. 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 PMH includes: COPD, morbid obesity, HTN, drug seeking behavior, bipolar, left knee surgery.    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 She reported to OT that she required assistance with LB ADL. She currently requires max assist for LB ADL, min assist for stand-pivot toilet transfers, and min assist for toileting hygiene tasks. Pt is very motivated to improve her independence with ADL and functional mobility and is interested in pursuing a prosthetic limb when able. Pt would benefit from continued OT services while admitted to improve independence and safety with ADL and functional mobility. Feel pt has great potential to return to independence with ADL and recommend CIR level rehabilitation post-acute D/C to facilitate maximum recovery. OT will continue to follow while admitted.     Follow Up Recommendations  CIR;Supervision/Assistance - 24 hour    Equipment Recommendations  Other (comment)(TBD at next venue of care)    Recommendations for Other Services       Precautions / Restrictions Precautions Precautions: Fall Restrictions Weight Bearing Restrictions: Yes LLE Weight Bearing: Non weight bearing      Mobility Bed Mobility               General bed mobility comments: Seated at EOB (with L residual limb supported on bed)on my arrival.   Transfers Overall transfer level: Needs assistance Equipment used: Rolling walker (2 wheeled) Transfers: Sit to/from [facility] Sit to Stand: Min assist Stand pivot transfers: Min assist       General transfer comment: Min assist for stability and to power up.     Balance Overall balance assessment: Needs assistance Sitting-balance support: No  upper extremity supported;Feet supported Sitting balance-Leahy Scale: Fair     Standing balance support: Single extremity supported;Bilateral upper extremity supported;During functional activity Standing balance-Leahy Scale: Poor Standing balance comment: Relies on at least single UE support and external assistance during dynamic standing tasks to complete toileting hygiene.                            ADL either performed or assessed with clinical judgement   ADL Overall ADL's : Needs assistance/impaired Eating/Feeding: Set up;Sitting   Grooming: Set up;Sitting   Upper Body Bathing: Min guard;Sitting   Lower Body Bathing: Maximal assistance;Sit to/from stand   Upper Body Dressing : Min guard;Sitting   Lower Body Dressing: Maximal assistance;Sit to/from stand   Toilet Transfer: Minimal assistance;RW;BSC;Stand-pivot   Toileting- Clothing Manipulation and Hygiene: Minimal assistance;Sit to/from stand Toileting - Clothing Manipulation Details (indicate cue type and reason): Encouraged pt to problem solve through participation in task.      Functional mobility during ADLs: Minimal assistance(stand-pivot only) General ADL Comments: Pt able to complete toileting tasks today with overall min assist and cues for toileting hygiene strategies.      Vision Patient Visual Report: No change from baseline Vision Assessment?: No apparent visual deficits     Perception     Praxis      Pertinent Vitals/Pain Pain Assessment: Faces Faces Pain Scale: Hurts little more Pain Location: surgical site Pain Descriptors / Indicators: Discomfort;Operative site guarding Pain Intervention(s): Limited activity within patient's tolerance;Monitored during session;Repositioned     Hand Dominance  Extremity/Trunk Assessment Upper Extremity Assessment Upper Extremity Assessment: Overall WFL for tasks assessed   Lower Extremity Assessment Lower Extremity Assessment: LLE  deficits/detail LLE Deficits / Details: s/p BKA       Communication Communication Communication: No difficulties   Cognition Arousal/Alertness: Awake/alert Behavior During Therapy: WFL for tasks assessed/performed Overall Cognitive Status: Within Functional Limits for tasks assessed                                     General Comments       Exercises     Shoulder Instructions      Home Living Family/patient expects to be discharged to:: Inpatient rehab Living Arrangements: Spouse/significant other;Children Available Help at Discharge: Family;Available PRN/intermittently Type of Home: House Home Access: Stairs to enter [employer] of Steps: 2(will be getting a ramp) Entrance Stairs-Rails: None Home Layout: One level     Bathroom Shower/Tub: Tub only   Biochemist, clinical: Standard Bathroom Accessibility: No   Home Equipment: Walker - 2 wheels   Additional Comments: walker will not fit into bathroom      Prior Functioning/Environment Level of Independence: Needs assistance  Gait / Transfers Assistance Needed: household ambulation with RW ADL's / Homemaking Assistance Needed: Assistance needed for LB ADL.            OT Problem List: Decreased strength;Decreased activity tolerance;Impaired balance (sitting and/or standing);Decreased safety awareness;Decreased knowledge of use of DME or AE;Decreased knowledge of precautions;Pain      OT Treatment/Interventions: Self-care/ADL training;Therapeutic exercise;Energy conservation;DME and/or AE instruction;Therapeutic activities;Patient/family education;Balance training    OT Goals(Current goals can be found in the care plan section) Acute Rehab OT Goals Patient Stated Goal: go to rehab OT Goal Formulation: With patient/family Time For Goal Achievement: 03/19/18 Potential to Achieve Goals: Good ADL Goals Pt Will Perform Grooming: with min assist;standing Pt Will Perform Lower Body Dressing:  with min assist;with adaptive equipment;sit to/from stand Pt Will Transfer to Toilet: with min assist;ambulating;bedside commode;regular height toilet(BSC over toilet; with RW) Pt Will Perform Toileting - Clothing Manipulation and hygiene: with supervision;sit to/from stand  OT Frequency: Min 2X/week   Barriers to D/C:            Co-evaluation              AM-PAC PT "6 Clicks" Daily Activity     Outcome Measure Help from another person eating meals?: A Little Help from another person taking care of personal grooming?: A Little Help from another person toileting, which includes using toliet, bedpan, or urinal?: A Little Help from another person bathing (including washing, rinsing, drying)?: A Lot Help from another person to put on and taking off regular upper body clothing?: A Little Help from another person to put on and taking off regular lower body clothing?: A Lot 6 Click Score: 16   End of Session Equipment Utilized During Treatment: Gait belt;Rolling walker Nurse Communication: Mobility status  Activity Tolerance: Patient tolerated treatment well Patient left: in bed;with call bell/phone within reach;with family/visitor present(seated at EOB)  OT Visit Diagnosis: Pain;Other abnormalities of gait and mobility (R26.89) Pain - Right/Left: Left Pain - part of body: Leg(residual limb)                Time: 6578-4696 OT Time Calculation (min): 21 min Charges:  OT General Charges $OT Visit: 1 Visit OT Evaluation $OT Eval Moderate Complexity: 1 Mod G-Codes:     [employer]  Eusebio Me, MS OTR/L  Pager: [place] A Anthonyjames Bargar 03/05/2018, 1:10 PM

## 2018-03-05 NOTE — Progress Notes (Signed)
Subjective: Patient stable.  Still reports pain as moderate   Objective: Vital signs in last 24 hours: Temp:  [98.2 F (36.8 C)-98.6 F (37 C)] 98.2 F (36.8 C) (03/31 0535) Pulse Rate:  [99-108] 102 (03/31 0535) Resp:  [18-20] 18 (03/30 1257) BP: (94-146)/(63-106) 94/80 (03/31 0535) SpO2:  [93 %-98 %] 93 % (03/31 0535)  Intake/Output from previous day: No intake/output data recorded. Intake/Output this shift: No intake/output data recorded.  Exam:  Compartment soft  Labs: Recent Labs    03/03/18 0750  HGB 11.3*   Recent Labs    03/03/18 0750  WBC 11.1*  RBC 3.89  HCT 33.8*  PLT 369   Recent Labs    03/03/18 0750  NA 132*  K 3.7  CL 100*  CO2 21*  BUN 22*  CREATININE 0.98  GLUCOSE 359*  CALCIUM 8.7*   No results for input(s): LABPT, INR in the last 72 hours.  Assessment/Plan: Plan at this time is that I added longer acting oxycodone to her pain regimen yesterday.  She was on opioid medication prior to surgery which will make her pain control more difficult.  Possible discharge tomorrow per Dr. Leanne Chang 03/05/2018, 7:54 AM

## 2018-03-05 NOTE — Consult Note (Signed)
Physical Medicine and Rehabilitation Consult Reason for Consult: Left transtibial amputation Referring Phsyician: Meridee Score MD [name] is an 42 y.o. female.   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 Sharol Given on 02/27/2018 with dehiscence of her wound.  She was diagnosed with osteomyelitis, surgery was scheduled for 03/03/2018.  Postoperatively patient had pain graded as moderate.  She was placed on home CPAP unit.  She has a history of chronic pain as well as "drug seeking behavior" on her problem list.  PT evaluated patient on 03/04/2018, min assist level for transfers, OT eval is pending    Review of Systems -Review of Systems  Constitutional: Negative for chills and fever.  HENT: Negative for ear discharge, hearing loss and nosebleeds.   Eyes: Negative for discharge and redness.  Respiratory: Negative for cough, sputum production, wheezing and stridor.   Cardiovascular: Positive for leg swelling. Negative for chest pain.  Gastrointestinal: Positive for constipation. Negative for abdominal pain and vomiting.  Genitourinary: Negative for dysuria.  Musculoskeletal: Negative for falls and joint pain.       + left stump pain  Skin: Negative for itching and rash.  Neurological: Positive for sensory change. Negative for dizziness and loss of consciousness.  Psychiatric/Behavioral: Negative for memory loss. The patient is not nervous/anxious.     Past Medical History:  Diagnosis Date  . Acute osteomyelitis of ankle or foot, left ([place]) 01/31/2018  . Alveolar hypoventilation   . Anemia    not on iron pill  . Arthritis   . Asthma   . Bipolar 2 disorder ([place])   . Carpal tunnel syndrome on right    recurrent  . Cellulitis 08/2010-08/2011  . Cellulitis of left foot 01/27/2018  . Chronic pain   . COPD (chronic  obstructive pulmonary disease) ([name])    Symbicort daily and Proventil as needed  . Costochondritis   . Depression   . Diabetes mellitus 2000   Type 2, Uncontrolled.Takes Lantus daily.Fasting blood sugar runs 150  . Dizziness    occasionally  . Drug-seeking behavior   . GERD (gastroesophageal reflux disease)    takes Pantoprazole and Zantac daily  . Headache    migraine-last one about a yr ago.Topamax daily  . HLD (hyperlipidemia)    takes Atorvastatin daily  . Hypertension    takes Lisinopril and Coreg daily  . Morbid obesity ([place])   . Muscle spasm    takes Flexeril as needed  . Nocturia   . Obstructive sleep apnea   . Peripheral neuropathy    takes Gabapentin daily  . Pneumonia    "walking" several yrs ago and as a baby  . Rectal fissure   . Restless leg   . Urinary frequency   . Varicose veins    Right medial thigh and Left leg    Past Surgical History:  Procedure Laterality Date  . AMPUTATION Left 02/01/2018   Procedure: LEFT FOURTH AND 5TH TOE RAY AMPUTATION;  Surgeon: [name], MD;  Location: [place];  Service: Orthopedics;  Laterality: Left;  . AMPUTATION Left 03/03/2018   Procedure: LEFT BELOW KNEE AMPUTATION;  Surgeon: [name], MD;  Location: [place];  Service: Orthopedics;  Laterality: Left;  . CARPAL TUNNEL RELEASE Bilateral   . CESAREAN SECTION    . KNEE ARTHROSCOPY Right 07/17/2010  . LEFT HEART CATHETERIZATION WITH  CORONARY ANGIOGRAM N/A 07/27/2012   Procedure: LEFT HEART CATHETERIZATION WITH CORONARY ANGIOGRAM;  Surgeon: Sherren Mocha, MD;  Location: [facility] CATH LAB;  Service: Cardiovascular;  Laterality: N/A;  . left knee surgery     screws she thinks  . MASS EXCISION N/A 06/29/2013   Procedure:  WIDE LOCAL EXCISION OF POSTERIOR NECK ABSCESS;  Surgeon: Ralene Ok, MD;  Location: [place];  Service: General;  Laterality: N/A;   Family History  Problem Relation Age of Onset  . Diabetes Mother   . Hyperlipidemia Mother   . Depression Mother   .  Varicose Veins Mother   . Heart attack Paternal Uncle   . Heart disease Paternal Grandmother   . Heart attack Paternal Grandmother   . Heart attack Paternal Grandfather   . Heart disease Paternal Grandfather   . Heart attack Father   . Cancer Maternal Grandmother        COLON  . Hypertension Maternal Grandmother   . Hyperlipidemia Maternal Grandmother   . Diabetes Maternal Grandmother   . Other Maternal Grandfather        GUN SHOT   Social History:  reports that she quit smoking about 24 years ago. Her smoking use included cigarettes. She has a 0.09 pack-year smoking history. She has never used smokeless tobacco. She reports that she has current or past drug history. She reports that she does not drink alcohol. Allergies:  Allergies  Allergen Reactions  . Kiwi Extract Shortness Of Breath and Swelling  . Trental [Pentoxifylline] Nausea And Vomiting  . Nubain [Nalbuphine Hcl] Other (See Comments)    "FEELS LIKE SOMETHING CRAWLING ON ME"   Medications Prior to Admission  Medication Sig Dispense Refill  . ACCU-CHEK GUIDE test strip USE T0 CHECK BLOOD SUGAR 3 TIMES DAILY 100 each 5  . ACCU-CHEK SOFTCLIX LANCETS lancets Use as instructed to check blood sugar 3 times per day 100 each 12  . atorvastatin (LIPITOR) 20 MG tablet Take 1 tablet (20 mg total) by mouth daily. 30 tablet 5  . Blood Glucose Monitoring Suppl (ACCU-CHEK NANO SMARTVIEW) w/Device KIT Use to check blood sugar 3 times daily 1 kit 0  . carvedilol (COREG) 3.125 MG tablet TAKE ONE (1) TABLET BY MOUTH TWO (2) TIMES DAILY (Patient taking differently: TAKE ONE (1) TABLET BY MOUTH ONCE A DAY) 60 tablet 3  . Cholecalciferol 1000 units tablet Take 1 tablet (1,000 Units total) by mouth daily. 90 tablet 0  . doxycycline (VIBRA-TABS) 100 MG tablet Take 1 tablet (100 mg total) by mouth 2 (two) times daily. 60 tablet 0  . gabapentin (NEURONTIN) 600 MG tablet Take 2 tablets (1,200 mg total) by mouth 3 (three) times daily. 180 tablet 5  .  HYDROcodone-acetaminophen (NORCO) 10-325 MG tablet Take 1 tablet by mouth every 8 (eight) hours as needed for moderate pain. (Patient taking differently: Take 1 tablet by mouth 3 (three) times daily. ) 90 tablet 0  . insulin aspart (NOVOLOG) 100 UNIT/ML injection Inject 15 Units into the skin daily with lunch. (Patient taking differently: Inject 15-25 Units into the skin See admin instructions. Take 15 units with lunch and 25 units with supper) 10 mL 0  . Insulin Degludec (TRESIBA FLEXTOUCH) 200 UNIT/ML SOPN Inject 80 Units into the skin daily with breakfast. (Patient taking differently: Inject 85 Units into the skin daily with breakfast. ) 18 mL 0  . Insulin Pen Needle (NOVOFINE) 32G X 6 MM MISC 1 Units by Does not apply route 3 (three) times daily. 100  each 11  . Insulin Pen Needle (PEN NEEDLES) 31G X 6 MM MISC Use to inject insulin daily 100 each 3  . liraglutide (VICTOZA) 18 MG/3ML SOPN INJECT 0.3 MILLILITERS (1.8MG TOTAL) INTO THE SKIN DAILY 9 mL 2  . naloxone (NARCAN) nasal spray 4 mg/0.1 mL 1 spray into the nose as needed for overdose (Patient taking differently: Place 1 spray into the nose once as needed (overdose). 1 spray into the nose as needed for overdose) 2 kit 0  . nitroGLYCERIN (NITRODUR - DOSED IN MG/24 HR) 0.2 mg/hr patch Place 1 patch (0.2 mg total) onto the skin daily. 30 patch 12  . pantoprazole (PROTONIX) 40 MG tablet Take 1 tablet (40 mg total) by mouth daily. 30 tablet 1  . SYMBICORT 160-4.5 MCG/ACT inhaler INHALE 2 PUFFS INTO THE LUNGS 2 TIMES DAILY (Patient taking differently: INHALE 2 PUFFS INTO THE LUNGS 1 TIME DAILY) 10.2 g 4  . albuterol (PROVENTIL HFA;VENTOLIN HFA) 108 (90 Base) MCG/ACT inhaler Inhale 1-2 puffs into the lungs every 6 (six) hours as needed for wheezing or shortness of breath. 18 g 5  . amoxicillin-clavulanate (AUGMENTIN) 875-125 MG tablet Take 1 tablet by mouth every 12 (twelve) hours. (Patient not taking: Reported on 03/01/2018) 3 tablet 0  . cetirizine  (ZYRTEC) 10 MG tablet Take 1 tablet (10 mg total) by mouth daily. (Patient taking differently: Take 10 mg by mouth daily as needed for allergies. ) 30 tablet 11  . ciprofloxacin (CIPRO) 500 MG tablet Take 1 tablet (500 mg total) by mouth 2 (two) times daily. (Patient not taking: Reported on 03/01/2018) 3 tablet 0  . pentoxifylline (TRENTAL) 400 MG CR tablet Take 1 tablet (400 mg total) by mouth 3 (three) times daily with meals. (Patient not taking: Reported on 03/01/2018) 90 tablet 3  . polyethylene glycol (MIRALAX / GLYCOLAX) packet Take 17 g by mouth daily as needed for mild constipation. (Patient not taking: Reported on 03/01/2018) 14 each 0    Home: Home Living Family/patient expects to be discharged to:: Inpatient rehab Living Arrangements: Spouse/significant other, Children Available Help at Discharge: Family, Available PRN/intermittently Type of Home: House Home Access: Stairs to enter [employer] of Steps: 2(Patient will be getting a ramp ) Entrance Stairs-Rails: None Home Layout: One level Bathroom Shower/Tub: Tub only Biochemist, clinical: Standard Bathroom Accessibility: Yes Home Equipment: Walker - 2 wheels  Functional History: Prior Function Comments: Patients husband has been helping with ADLs, working during day, CNA intermittent  Functional Status:  Mobility:     Ambulation/Gait General Gait Details: unable due to pain     ADL:    Cognition: Cognition Orientation Level: Oriented X4 Cognition Arousal/Alertness: Awake/alert Behavior During Therapy: WFL for tasks assessed/performed  Blood pressure 94/80, pulse (!) 102, temperature 98.2 F (36.8 C), temperature source Axillary, resp. rate 18, height _0  (1.575 m), weight (!) 146.1 kg (322 lb), last menstrual period 02/17/2018, SpO2 93 %. Physical Exam  Nursing note and vitals reviewed. Constitutional: She is oriented to person, place, and time. She appears well-developed. No distress.  Obese  HENT:   Head: Normocephalic and atraumatic.  Eyes: Pupils are equal, round, and reactive to light. Conjunctivae are normal. No scleral icterus.  Neck: Normal range of motion. No JVD present.  Cardiovascular: Normal rate, regular rhythm and normal heart sounds. Exam reveals no friction rub.  No murmur heard. Respiratory: Effort normal and breath sounds normal. Stridor present. No respiratory distress. She has no wheezes.  GI: Soft. Bowel sounds are normal. She  exhibits no distension. There is no tenderness.  Musculoskeletal:  Left BKA stump mild to moderate edema no pain with flexion extension of the knee  Neurological: She is alert and oriented to person, place, and time.  Her strength is 5/5 bilateral deltoid bicep tricep grip right hip flexion knee extension ankle dorsiflexion Sensation intact light touch in the right foot.  Skin: Skin is warm and dry. She is not diaphoretic.  Psychiatric: She has a normal mood and affect. Her behavior is normal. Judgment and thought content normal.    Results for orders placed or performed during the hospital encounter of 03/03/18 (from the past 24 hour(s))  Glucose, capillary     Status: Abnormal   Collection Time: 03/04/18 11:39 AM  Result Value Ref Range   Glucose-Capillary 251 (H) 65 - 99 mg/dL  Glucose, capillary     Status: Abnormal   Collection Time: 03/04/18  4:41 PM  Result Value Ref Range   Glucose-Capillary 291 (H) 65 - 99 mg/dL  Glucose, capillary     Status: Abnormal   Collection Time: 03/04/18  7:46 PM  Result Value Ref Range   Glucose-Capillary 264 (H) 65 - 99 mg/dL   Comment 1 Document in Chart   Glucose, capillary     Status: Abnormal   Collection Time: 03/05/18  6:56 AM  Result Value Ref Range   Glucose-Capillary 280 (H) 65 - 99 mg/dL   Comment 1 Document in Chart    No results found.  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 Does the need for close, 24 hr/day medical  supervision in concert with the patient's rehab needs make it unreasonable for this patient to be served in a less intensive setting? Yes 2. Co-Morbidities requiring supervision/potential complications: Uncontrolled diabetes with polyneuropathy, history of COPD, morbid obesity, OSA 3. Due to bladder management, bowel management, safety, skin/wound care, disease management, medication administration, pain management and patient education, does the patient require 24 hr/day rehab nursing? Yes 4. Does the patient require coordinated care of a physician, rehab nurse, PT (1-2 hrs/day, 5 days/week) and OT (1-2 hrs/day, 5 days/week) to address physical and functional deficits in the context of the above medical diagnosis(es)? Yes Addressing deficits in the following areas: balance, endurance, locomotion, strength, transferring, bowel/bladder control, bathing, dressing and psychosocial support 5. Can the patient actively participate in an intensive therapy program of at least 3 hrs of therapy per day at least 5 days per week? Yes 6. The potential for patient to make measurable gains while on inpatient rehab is excellent 7. Anticipated functional outcomes upon discharge from inpatients are Mod I PT, Mod I  OT, NA SLP 8. Estimated rehab length of stay to reach the above functional goals is: 7d 9. Does the patient have adequate social supports to accommodate these discharge functional goals? Yes 10. Anticipated D/C setting: Home 11. Anticipated post D/C treatments: [facility] therapy 12. Overall Rehab/Functional Prognosis: excellent  RECOMMENDATIONS: This patient's condition is appropriate for continued rehabilitative care in the following setting: CIR Patient has agreed to participate in recommended program. Yes Note that insurance prior authorization may be required for reimbursement for recommended care.  Comment:Needs to be off IV pain med prior to [place] 03/05/2018

## 2018-03-06 ENCOUNTER — Other Ambulatory Visit: Payer: Self-pay

## 2018-03-06 ENCOUNTER — Inpatient Hospital Stay (HOSPITAL_COMMUNITY)
Admission: RE | Admit: 2018-03-06 | Discharge: 2018-03-16 | DRG: 560 | Disposition: A | Discharge status: Home / Self Care | Payer: Medicaid Other | Source: Intra-hospital | Attending: Physical Medicine & Rehabilitation | Admitting: Physical Medicine & Rehabilitation

## 2018-03-06 ENCOUNTER — Encounter (HOSPITAL_COMMUNITY): Payer: Self-pay | Admitting: Emergency Medicine

## 2018-03-06 DIAGNOSIS — Z6841 Body Mass Index (BMI) 40.0 and over, adult: Secondary | ICD-10-CM

## 2018-03-06 DIAGNOSIS — E876 Hypokalemia: Secondary | ICD-10-CM | POA: Diagnosis present

## 2018-03-06 DIAGNOSIS — D62 Acute posthemorrhagic anemia: Secondary | ICD-10-CM | POA: Diagnosis present

## 2018-03-06 DIAGNOSIS — K5903 Drug induced constipation: Secondary | ICD-10-CM | POA: Diagnosis not present

## 2018-03-06 DIAGNOSIS — F3181 Bipolar II disorder: Secondary | ICD-10-CM | POA: Diagnosis present

## 2018-03-06 DIAGNOSIS — G4733 Obstructive sleep apnea (adult) (pediatric): Secondary | ICD-10-CM | POA: Diagnosis present

## 2018-03-06 DIAGNOSIS — E1142 Type 2 diabetes mellitus with diabetic polyneuropathy: Secondary | ICD-10-CM | POA: Diagnosis present

## 2018-03-06 DIAGNOSIS — G8918 Other acute postprocedural pain: Secondary | ICD-10-CM

## 2018-03-06 DIAGNOSIS — G2581 Restless legs syndrome: Secondary | ICD-10-CM | POA: Diagnosis present

## 2018-03-06 DIAGNOSIS — J9620 Acute and chronic respiratory failure, unspecified whether with hypoxia or hypercapnia: Secondary | ICD-10-CM | POA: Diagnosis not present

## 2018-03-06 DIAGNOSIS — K219 Gastro-esophageal reflux disease without esophagitis: Secondary | ICD-10-CM | POA: Diagnosis present

## 2018-03-06 DIAGNOSIS — J309 Allergic rhinitis, unspecified: Secondary | ICD-10-CM | POA: Insufficient documentation

## 2018-03-06 DIAGNOSIS — M792 Neuralgia and neuritis, unspecified: Secondary | ICD-10-CM

## 2018-03-06 DIAGNOSIS — Z794 Long term (current) use of insulin: Secondary | ICD-10-CM | POA: Diagnosis not present

## 2018-03-06 DIAGNOSIS — Z89512 Acquired absence of left leg below knee: Secondary | ICD-10-CM

## 2018-03-06 DIAGNOSIS — I10 Essential (primary) hypertension: Secondary | ICD-10-CM | POA: Diagnosis present

## 2018-03-06 DIAGNOSIS — G894 Chronic pain syndrome: Secondary | ICD-10-CM | POA: Diagnosis present

## 2018-03-06 DIAGNOSIS — Z833 Family history of diabetes mellitus: Secondary | ICD-10-CM | POA: Diagnosis not present

## 2018-03-06 DIAGNOSIS — R0989 Other specified symptoms and signs involving the circulatory and respiratory systems: Secondary | ICD-10-CM

## 2018-03-06 DIAGNOSIS — J449 Chronic obstructive pulmonary disease, unspecified: Secondary | ICD-10-CM | POA: Diagnosis present

## 2018-03-06 DIAGNOSIS — Z89422 Acquired absence of other left toe(s): Secondary | ICD-10-CM | POA: Diagnosis not present

## 2018-03-06 DIAGNOSIS — E871 Hypo-osmolality and hyponatremia: Secondary | ICD-10-CM | POA: Diagnosis present

## 2018-03-06 DIAGNOSIS — J45909 Unspecified asthma, uncomplicated: Secondary | ICD-10-CM | POA: Diagnosis not present

## 2018-03-06 DIAGNOSIS — E1165 Type 2 diabetes mellitus with hyperglycemia: Secondary | ICD-10-CM | POA: Diagnosis present

## 2018-03-06 DIAGNOSIS — E669 Obesity, unspecified: Secondary | ICD-10-CM | POA: Diagnosis not present

## 2018-03-06 DIAGNOSIS — S88119A Complete traumatic amputation at level between knee and ankle, unspecified lower leg, initial encounter: Secondary | ICD-10-CM

## 2018-03-06 DIAGNOSIS — S88112A Complete traumatic amputation at level between knee and ankle, left lower leg, initial encounter: Secondary | ICD-10-CM

## 2018-03-06 DIAGNOSIS — Z87891 Personal history of nicotine dependence: Secondary | ICD-10-CM

## 2018-03-06 DIAGNOSIS — K5900 Constipation, unspecified: Secondary | ICD-10-CM | POA: Diagnosis present

## 2018-03-06 DIAGNOSIS — S88112S Complete traumatic amputation at level between knee and ankle, left lower leg, sequela: Secondary | ICD-10-CM

## 2018-03-06 DIAGNOSIS — E785 Hyperlipidemia, unspecified: Secondary | ICD-10-CM | POA: Diagnosis present

## 2018-03-06 DIAGNOSIS — Z79899 Other long term (current) drug therapy: Secondary | ICD-10-CM

## 2018-03-06 DIAGNOSIS — D72829 Elevated white blood cell count, unspecified: Secondary | ICD-10-CM

## 2018-03-06 DIAGNOSIS — M86172 Other acute osteomyelitis, left ankle and foot: Secondary | ICD-10-CM | POA: Diagnosis not present

## 2018-03-06 DIAGNOSIS — Z4781 Encounter for orthopedic aftercare following surgical amputation: Principal | ICD-10-CM

## 2018-03-06 LAB — GLUCOSE, CAPILLARY
GLUCOSE-CAPILLARY: 291 mg/dL — AB (ref 65–99)
GLUCOSE-CAPILLARY: 216 mg/dL — AB (ref 65–99)
GLUCOSE-CAPILLARY: 191 mg/dL — AB (ref 65–99)
Glucose-Capillary: 178 mg/dL — ABNORMAL HIGH (ref 65–99)
Glucose-Capillary: 222 mg/dL — ABNORMAL HIGH (ref 65–99)

## 2018-03-06 MED ORDER — ONDANSETRON HCL 4 MG PO TABS
4.0000 mg | ORAL_TABLET | Freq: Four times a day (QID) | ORAL | Status: DC | PRN
  Administered 2018-03-06 – 2018-03-16 (×2): 4 mg via ORAL
  Filled 2018-03-06 (×2): qty 1

## 2018-03-06 MED ORDER — LIRAGLUTIDE 18 MG/3ML ~~LOC~~ SOPN: 1.8000 mg | PEN_INJECTOR | Freq: Every morning | SUBCUTANEOUS | Status: DC

## 2018-03-06 MED ORDER — ATORVASTATIN CALCIUM 20 MG PO TABS
20.0000 mg | ORAL_TABLET | Freq: Every day | ORAL | Status: DC
  Administered 2018-03-06 – 2018-03-15 (×10): 20 mg via ORAL
  Filled 2018-03-06 (×10): qty 1

## 2018-03-06 MED ORDER — POLYETHYLENE GLYCOL 3350 17 G PO PACK: 17.0000 g | PACK | Freq: Every day | ORAL | Status: DC | PRN

## 2018-03-06 MED ORDER — GABAPENTIN 600 MG PO TABS
1200.0000 mg | ORAL_TABLET | Freq: Three times a day (TID) | ORAL | Status: DC
  Administered 2018-03-06 – 2018-03-16 (×29): 1200 mg via ORAL
  Filled 2018-03-06 (×29): qty 2

## 2018-03-06 MED ORDER — DOCUSATE SODIUM 100 MG PO CAPS
100.0000 mg | ORAL_CAPSULE | Freq: Two times a day (BID) | ORAL | Status: DC
  Administered 2018-03-06 – 2018-03-07 (×3): 100 mg via ORAL
  Filled 2018-03-06 (×3): qty 1

## 2018-03-06 MED ORDER — OXYCODONE HCL ER 10 MG PO T12A
10.0000 mg | EXTENDED_RELEASE_TABLET | Freq: Two times a day (BID) | ORAL | Status: AC
  Administered 2018-03-06: 10 mg via ORAL
  Filled 2018-03-06: qty 1

## 2018-03-06 MED ORDER — INSULIN ASPART 100 UNIT/ML ~~LOC~~ SOLN
6.0000 [IU] | Freq: Three times a day (TID) | SUBCUTANEOUS | Status: DC
  Administered 2018-03-06 – 2018-03-16 (×29): 6 [IU] via SUBCUTANEOUS

## 2018-03-06 MED ORDER — PANTOPRAZOLE SODIUM 40 MG PO TBEC
40.0000 mg | DELAYED_RELEASE_TABLET | Freq: Every day | ORAL | Status: DC
  Administered 2018-03-07 – 2018-03-16 (×10): 40 mg via ORAL
  Filled 2018-03-06 (×10): qty 1

## 2018-03-06 MED ORDER — ACETAMINOPHEN 325 MG PO TABS
325.0000 mg | ORAL_TABLET | Freq: Four times a day (QID) | ORAL | Status: DC | PRN
  Administered 2018-03-07 – 2018-03-13 (×5): 650 mg via ORAL
  Filled 2018-03-06 (×5): qty 2

## 2018-03-06 MED ORDER — OXYCODONE HCL 5 MG PO TABS
10.0000 mg | ORAL_TABLET | ORAL | Status: DC | PRN
  Administered 2018-03-06 – 2018-03-08 (×8): 15 mg via ORAL
  Filled 2018-03-06 (×8): qty 3

## 2018-03-06 MED ORDER — NITROGLYCERIN 0.2 MG/HR TD PT24
0.2000 mg | MEDICATED_PATCH | Freq: Every day | TRANSDERMAL | Status: DC
  Filled 2018-03-06 (×10): qty 1

## 2018-03-06 MED ORDER — DEXTROSE 5 % IV SOLN: 500.0000 mg | Freq: Four times a day (QID) | INTRAVENOUS | Status: DC | PRN

## 2018-03-06 MED ORDER — SORBITOL 70 % SOLN
30.0000 mL | Freq: Every day | Status: DC | PRN
  Administered 2018-03-09: 30 mL via ORAL
  Filled 2018-03-06: qty 30

## 2018-03-06 MED ORDER — METHOCARBAMOL 500 MG PO TABS
500.0000 mg | ORAL_TABLET | Freq: Four times a day (QID) | ORAL | Status: DC | PRN
  Administered 2018-03-06 – 2018-03-16 (×31): 500 mg via ORAL
  Filled 2018-03-06 (×32): qty 1

## 2018-03-06 MED ORDER — ASPIRIN 325 MG PO TABS
325.0000 mg | ORAL_TABLET | Freq: Every day | ORAL | Status: DC
  Administered 2018-03-07 – 2018-03-08 (×2): 325 mg via ORAL
  Filled 2018-03-06 (×2): qty 1

## 2018-03-06 MED ORDER — MOMETASONE FURO-FORMOTEROL FUM 200-5 MCG/ACT IN AERO
2.0000 | INHALATION_SPRAY | Freq: Two times a day (BID) | RESPIRATORY_TRACT | Status: DC
  Filled 2018-03-06: qty 8.8

## 2018-03-06 MED ORDER — INSULIN GLARGINE 100 UNIT/ML ~~LOC~~ SOLN
42.0000 [IU] | Freq: Every morning | SUBCUTANEOUS | Status: DC
  Administered 2018-03-07: 42 [IU] via SUBCUTANEOUS
  Filled 2018-03-06 (×2): qty 0.42

## 2018-03-06 MED ORDER — INSULIN ASPART 100 UNIT/ML ~~LOC~~ SOLN
0.0000 [IU] | Freq: Three times a day (TID) | SUBCUTANEOUS | Status: DC
  Administered 2018-03-06: 4 [IU] via SUBCUTANEOUS
  Administered 2018-03-07: 7 [IU] via SUBCUTANEOUS
  Administered 2018-03-07: 4 [IU] via SUBCUTANEOUS
  Administered 2018-03-07: 7 [IU] via SUBCUTANEOUS
  Administered 2018-03-08: 4 [IU] via SUBCUTANEOUS
  Administered 2018-03-08: 11 [IU] via SUBCUTANEOUS
  Administered 2018-03-08 – 2018-03-09 (×2): 4 [IU] via SUBCUTANEOUS
  Administered 2018-03-09: 3 [IU] via SUBCUTANEOUS
  Administered 2018-03-09: 7 [IU] via SUBCUTANEOUS
  Administered 2018-03-10 – 2018-03-11 (×5): 4 [IU] via SUBCUTANEOUS
  Administered 2018-03-12: 11 [IU] via SUBCUTANEOUS
  Administered 2018-03-12: 7 [IU] via SUBCUTANEOUS
  Administered 2018-03-13: 4 [IU] via SUBCUTANEOUS
  Administered 2018-03-13: 3 [IU] via SUBCUTANEOUS
  Administered 2018-03-13 – 2018-03-16 (×8): 4 [IU] via SUBCUTANEOUS

## 2018-03-06 MED ORDER — OXYCODONE HCL 5 MG PO TABS
5.0000 mg | ORAL_TABLET | ORAL | Status: DC | PRN
  Administered 2018-03-06: 5 mg via ORAL

## 2018-03-06 MED ORDER — BISACODYL 10 MG RE SUPP
10.0000 mg | Freq: Every day | RECTAL | Status: DC | PRN
  Administered 2018-03-09: 10 mg via RECTAL
  Filled 2018-03-06 (×2): qty 1

## 2018-03-06 MED ORDER — ALBUTEROL SULFATE (2.5 MG/3ML) 0.083% IN NEBU: 2.5000 mg | INHALATION_SOLUTION | Freq: Four times a day (QID) | RESPIRATORY_TRACT | Status: DC | PRN

## 2018-03-06 MED ORDER — ONDANSETRON HCL 4 MG/2ML IJ SOLN: 4.0000 mg | Freq: Four times a day (QID) | INTRAMUSCULAR | Status: DC | PRN

## 2018-03-06 MED ORDER — CARVEDILOL 3.125 MG PO TABS
3.1250 mg | ORAL_TABLET | Freq: Two times a day (BID) | ORAL | Status: DC
  Administered 2018-03-07: 3.125 mg via ORAL
  Filled 2018-03-06 (×12): qty 1

## 2018-03-06 NOTE — Progress Notes (Signed)
Inpatient Diabetes Program Recommendations  AACE/ADA: New Consensus Statement on Inpatient Glycemic Control (2015)  Target Ranges:  Prepandial:   less than 140 mg/dL      Peak postprandial:   less than 180 mg/dL (1-2 hours)      Critically ill patients:  140 - 180 mg/dL   Results for [name], [name] (MRN [id number]) as of 03/06/2018 13:41  Ref. Range 03/05/2018 06:56 03/05/2018 11:24 03/05/2018 16:32 03/05/2018 21:49 03/06/2018 06:22 03/06/2018 11:49  Glucose-Capillary Latest Ref Range: 65 - 99 mg/dL 280 (H) 269 (H) 270 (H) 356 (H) 291 (H) 216 (H)  Results for [name], [name] (MRN [id number]) as of 03/06/2018 13:41  Ref. Range 10/29/2017 05:36 01/27/2018 04:29  Hemoglobin A1C Latest Ref Range: 4.8 - 5.6 % 13.7 (H) 13.8 (H)   Review of Glycemic Control  Diabetes history: DM2 Outpatient Diabetes medications: Tresiba 85 units QAM, Novolog 15 units with lunch, Novolog 25 units with supper, Victoza 1.8 mg daily Current orders for Inpatient glycemic control: Lantus 42 units QAM, Novolog 0-20 units TID with meals, Novolog 6 units TID with meals, Victoza 1.8 mg QAM  Inpatient Diabetes Program Recommendations:  Insulin - Basal: Please consider increasing Lantus to 45 units QAM. Correction (SSI): Bedtime glucose 356 mg/dl last night but no insulin given since no bedtime correction ordered. Please consider ordering Novolog 0-5 units QHS for bedtime correction scale. Insulin - Meal Coverage: Please consider increasing meal coverage to Novolog 15 units TID with meals if patient eats at least 50% of meals. A1C: A1C 13.8% on 01/27/18. Diabetes Coordinator spoke with patient on 01/31/18 during previous admission regarding A1C and DM control.  NOTE: Noted patient will be transferring to [facility].  Thanks, Barnie Alderman, RN, MSN, CDE Diabetes Coordinator Inpatient Diabetes Program [phone] (Team Pager from 8am to 5pm)    Thanks, Barnie Alderman, RN, MSN, CDE Diabetes Coordinator Inpatient Diabetes Program [phone]  (Team Pager from 8am to 5pm)

## 2018-03-06 NOTE — Progress Notes (Signed)
Patient report was given to [facility], RN  Patient is aware that she will be going to [facility] room [facility].  Saline Lock will remain in place.

## 2018-03-06 NOTE — Progress Notes (Signed)
Patient ID: [name], female   DOB: November 02, 1976, 42 y.o.   MRN: [id number] Patient states that she was feeling fine yesterday but had acute onset of pain in her residual limb This morning.  Examination there is minimal drainage in the wound VAC canister.  Both physical therapy and inpatient rehabilitation feel patient would be a good candidate for inpatient rehabilitation.  Will discharge patient to inpatient rehabilitation when this is approved by insurance.

## 2018-03-06 NOTE — Assessment & Plan Note (Signed)
Uncontrolled fasting sugars.  Increase tresiba to 85 units daily.  Further glycemic control will be achieved in the hospital as she is being admitted in a few days for her amputation.

## 2018-03-06 NOTE — Discharge Summary (Signed)
Discharge Diagnoses:  Active Problems:   Dehiscence of amputation stump ([name])   Acquired absence of leg below knee [facility])   Surgeries: Procedure(s): LEFT BELOW KNEE AMPUTATION on 03/03/2018    Consultants:   Discharged Condition: Improved  Hospital Course: [name] is an 42 y.o. female who was admitted 03/03/2018 with a chief complaint of dehiscence left foot amputation, with a final diagnosis of Dehiscence Left Foot Amputation.  Patient was brought to the operating room on 03/03/2018 and underwent Procedure(s): LEFT BELOW KNEE AMPUTATION.    Patient was given perioperative antibiotics:  Anti-infectives (From admission, onward)   Start     Dose/Rate Route Frequency Ordered Stop   03/03/18 1530  ceFAZolin (ANCEF) IVPB 2g/100 mL premix     2 g 200 mL/hr over 30 Minutes Intravenous Every 6 hours 03/03/18 1422 03/04/18 0929   03/03/18 0700  ceFAZolin (ANCEF) 3 g in dextrose 5 % 50 mL IVPB     3 g 130 mL/hr over 30 Minutes Intravenous To Short Stay 03/02/18 0829 03/03/18 0953    .  Patient was given sequential compression devices, early ambulation, and aspirin for DVT prophylaxis.  Recent vital signs:  Patient Vitals for the past 24 hrs:  BP Temp Temp src Pulse Resp SpO2  03/06/18 0516 131/68 98.6 F (37 C) Oral (!) 110 18 92 %  03/05/18 2004 (!) 146/83 97.9 F (36.6 C) Oral (!) 111 18 97 %  03/05/18 1716 116/74 - - 100 - -  03/05/18 1328 116/74 98.4 F (36.9 C) Oral 100 18 100 %  .  Recent laboratory studies: No results found.  Discharge Medications:   Allergies as of 03/06/2018      Reactions   Kiwi Extract Shortness Of Breath, Swelling   Trental [pentoxifylline] Nausea And Vomiting   Nubain [nalbuphine Hcl] Other (See Comments)   "FEELS LIKE SOMETHING CRAWLING ON ME"      Medication List    TAKE these medications   ACCU-CHEK GUIDE test strip Generic drug:  glucose blood USE T0 CHECK BLOOD SUGAR 3 TIMES DAILY   ACCU-CHEK NANO SMARTVIEW w/Device Kit Use to  check blood sugar 3 times daily   ACCU-CHEK SOFTCLIX LANCETS lancets Use as instructed to check blood sugar 3 times per day   albuterol 108 (90 Base) MCG/ACT inhaler Commonly known as:  PROVENTIL HFA;VENTOLIN HFA Inhale 1-2 puffs into the lungs every 6 (six) hours as needed for wheezing or shortness of breath.   amoxicillin-clavulanate 875-125 MG tablet Commonly known as:  AUGMENTIN Take 1 tablet by mouth every 12 (twelve) hours.   atorvastatin 20 MG tablet Commonly known as:  LIPITOR Take 1 tablet (20 mg total) by mouth daily.   carvedilol 3.125 MG tablet Commonly known as:  COREG TAKE ONE (1) TABLET BY MOUTH TWO (2) TIMES DAILY What changed:  See the new instructions.   cetirizine 10 MG tablet Commonly known as:  ZYRTEC Take 1 tablet (10 mg total) by mouth daily. What changed:    when to take this  reasons to take this   Cholecalciferol 1000 units tablet Take 1 tablet (1,000 Units total) by mouth daily.   ciprofloxacin 500 MG tablet Commonly known as:  CIPRO Take 1 tablet (500 mg total) by mouth 2 (two) times daily.   doxycycline 100 MG tablet Commonly known as:  VIBRA-TABS Take 1 tablet (100 mg total) by mouth 2 (two) times daily.   gabapentin 600 MG tablet Commonly known as:  NEURONTIN Take 2 tablets (1,200 mg  total) by mouth 3 (three) times daily.   HYDROcodone-acetaminophen 10-325 MG tablet Commonly known as:  NORCO Take 1 tablet by mouth every 8 (eight) hours as needed for moderate pain. What changed:  when to take this   insulin aspart 100 UNIT/ML injection Commonly known as:  NOVOLOG Inject 15 Units into the skin daily with lunch. What changed:    how much to take  when to take this  additional instructions   Insulin Degludec 200 UNIT/ML Sopn Commonly known as:  TRESIBA FLEXTOUCH Inject 80 Units into the skin daily with breakfast. What changed:  how much to take   liraglutide 18 MG/3ML Sopn Commonly known as:  VICTOZA INJECT 0.3  MILLILITERS (1.8MG TOTAL) INTO THE SKIN DAILY   naloxone 4 MG/0.1ML Liqd nasal spray kit Commonly known as:  NARCAN 1 spray into the nose as needed for overdose What changed:    how much to take  how to take this  when to take this  reasons to take this  additional instructions   nitroGLYCERIN 0.2 mg/hr patch Commonly known as:  NITRODUR - Dosed in mg/24 hr Place 1 patch (0.2 mg total) onto the skin daily.   pantoprazole 40 MG tablet Commonly known as:  PROTONIX Take 1 tablet (40 mg total) by mouth daily.   Pen Needles 31G X 6 MM Misc Use to inject insulin daily   Insulin Pen Needle 32G X 6 MM Misc Commonly known as:  NOVOFINE 1 Units by Does not apply route 3 (three) times daily.   pentoxifylline 400 MG CR tablet Commonly known as:  TRENTAL Take 1 tablet (400 mg total) by mouth 3 (three) times daily with meals.   polyethylene glycol packet Commonly known as:  MIRALAX / GLYCOLAX Take 17 g by mouth daily as needed for mild constipation.   SYMBICORT 160-4.5 MCG/ACT inhaler Generic drug:  budesonide-formoterol INHALE 2 PUFFS INTO THE LUNGS 2 TIMES DAILY What changed:  See the new instructions.       Diagnostic Studies: No results found.  Patient benefited maximally from their hospital stay and there were no complications.     Disposition: Discharge disposition: 02-Transferred to [facility]      Discharge Instructions    Call MD / Call 911   Complete by:  As directed    If you experience chest pain or shortness of breath, CALL 911 and be transported to the hospital emergency room.  If you develope a fever above 101 F, pus (white drainage) or increased drainage or redness at the wound, or calf pain, call your surgeon's office.   Constipation Prevention   Complete by:  As directed    Drink plenty of fluids.  Prune juice may be helpful.  You may use a stool softener, such as Colace (over the counter) 100 mg twice a day.  Use MiraLax (over the  counter) for constipation as needed.   Diet - low sodium heart healthy   Complete by:  As directed    Increase activity slowly as tolerated   Complete by:  As directed    Negative Pressure Wound Therapy - Incisional   Complete by:  As directed      Follow-up Information    [name], MD In 1 week.   Specialty:  Orthopedic Surgery Contact information: [address] [place] [address] [phone]            Signed: [name] 03/06/2018, 11:56 AM

## 2018-03-06 NOTE — H&P (Signed)
Physical Medicine and Rehabilitation Admission H&P    Chief complain:Stump pain  HPI: [name] is an 42 y.o.  right-handed female patient with history of morbid obesity, diabetes uncontrolled with polyneuropathy on insulin, bipolar disorder, COPD, with recent fourth and fifth ray amputations of the left foot performed on 02/01/2018. Per chart review and patient, patient lives with spouse.  One level home with 2 steps to entry.  Plan is for a ramp.  Household ambulator with a rolling walker.  Husband was assisting with some ADLs and working during the day.  Presented to the office of Dr. Sharol Given on 02/27/2018 with dehiscence of her wound.  She was diagnosed with osteomyelitis, surgery was scheduled for 03/03/2018 and underwent left transtibial amputation by Dr. Sharol Given.  Wound VAC applied. Postoperatively patient had pain graded as moderate.  She was placed on home CPAP unit.  She has a history of chronic pain as well as "drug seeking behavior" on her problem list and was monitored closely.  Physical and occupational therapy evaluations completed.  Patient was admitted for a comprehensive rehab program  Review of Systems  Constitutional: Negative for chills and fever.  HENT: Negative for hearing loss.   Eyes: Negative for blurred vision and double vision.  Respiratory: Positive for shortness of breath.   Cardiovascular: Negative for chest pain and palpitations.  Gastrointestinal: Positive for constipation. Negative for nausea and vomiting.       GERD  Genitourinary: Positive for frequency. Negative for hematuria.  Musculoskeletal: Positive for myalgias.  Skin: Negative for rash.  Neurological: Positive for dizziness and headaches. Negative for seizures.  Psychiatric/Behavioral: Positive for depression. The patient has insomnia.        Bipolar disorder  All other systems reviewed and are negative.  Past Medical History:  Diagnosis Date  . Acute osteomyelitis of ankle or foot, left ([place])  01/31/2018  . Alveolar hypoventilation   . Anemia    not on iron pill  . Arthritis   . Asthma   . Bipolar 2 disorder ([place])   . Carpal tunnel syndrome on right    recurrent  . Cellulitis 08/2010-08/2011  . Cellulitis of left foot 01/27/2018  . Chronic pain   . COPD (chronic obstructive pulmonary disease) ([name])    Symbicort daily and Proventil as needed  . Costochondritis   . Depression   . Diabetes mellitus 2000   Type 2, Uncontrolled.Takes Lantus daily.Fasting blood sugar runs 150  . Dizziness    occasionally  . Drug-seeking behavior   . GERD (gastroesophageal reflux disease)    takes Pantoprazole and Zantac daily  . Headache    migraine-last one about a yr ago.Topamax daily  . HLD (hyperlipidemia)    takes Atorvastatin daily  . Hypertension    takes Lisinopril and Coreg daily  . Morbid obesity ([place])   . Muscle spasm    takes Flexeril as needed  . Nocturia   . Obstructive sleep apnea   . Peripheral neuropathy    takes Gabapentin daily  . Pneumonia    "walking" several yrs ago and as a baby  . Rectal fissure   . Restless leg   . Urinary frequency   . Varicose veins    Right medial thigh and Left leg    Past Surgical History:  Procedure Laterality Date  . AMPUTATION Left 02/01/2018   Procedure: LEFT FOURTH AND 5TH TOE RAY AMPUTATION;  Surgeon: [name], MD;  Location: [facility];  Service: Orthopedics;  Laterality: Left;  .  AMPUTATION Left 03/03/2018   Procedure: LEFT BELOW KNEE AMPUTATION;  Surgeon: [name], MD;  Location: [facility];  Service: Orthopedics;  Laterality: Left;  . CARPAL TUNNEL RELEASE Bilateral   . CESAREAN SECTION    . KNEE ARTHROSCOPY Right 07/17/2010  . LEFT HEART CATHETERIZATION WITH CORONARY ANGIOGRAM N/A 07/27/2012   Procedure: LEFT HEART CATHETERIZATION WITH CORONARY ANGIOGRAM;  Surgeon: Sherren Mocha, MD;  Location: [facility] CATH LAB;  Service: Cardiovascular;  Laterality: N/A;  . left knee surgery     screws she thinks  . MASS EXCISION N/A  06/29/2013   Procedure:  WIDE LOCAL EXCISION OF POSTERIOR NECK ABSCESS;  Surgeon: Ralene Ok, MD;  Location: [facility];  Service: General;  Laterality: N/A;   Family History  Problem Relation Age of Onset  . Diabetes Mother   . Hyperlipidemia Mother   . Depression Mother   . Varicose Veins Mother   . Heart attack Paternal Uncle   . Heart disease Paternal Grandmother   . Heart attack Paternal Grandmother   . Heart attack Paternal Grandfather   . Heart disease Paternal Grandfather   . Heart attack Father   . Cancer Maternal Grandmother        COLON  . Hypertension Maternal Grandmother   . Hyperlipidemia Maternal Grandmother   . Diabetes Maternal Grandmother   . Other Maternal Grandfather        GUN SHOT   Social History:  reports that she quit smoking about 24 years ago. Her smoking use included cigarettes. She has a 0.09 pack-year smoking history. She has never used smokeless tobacco. She reports that she has current or past drug history. She reports that she does not drink alcohol. Allergies:  Allergies  Allergen Reactions  . Kiwi Extract Shortness Of Breath and Swelling  . Trental [Pentoxifylline] Nausea And Vomiting  . Nubain [Nalbuphine Hcl] Other (See Comments)    "FEELS LIKE SOMETHING CRAWLING ON ME"   Medications Prior to Admission  Medication Sig Dispense Refill  . ACCU-CHEK GUIDE test strip USE T0 CHECK BLOOD SUGAR 3 TIMES DAILY 100 each 5  . ACCU-CHEK SOFTCLIX LANCETS lancets Use as instructed to check blood sugar 3 times per day 100 each 12  . atorvastatin (LIPITOR) 20 MG tablet Take 1 tablet (20 mg total) by mouth daily. 30 tablet 5  . Blood Glucose Monitoring Suppl (ACCU-CHEK NANO SMARTVIEW) w/Device KIT Use to check blood sugar 3 times daily 1 kit 0  . carvedilol (COREG) 3.125 MG tablet TAKE ONE (1) TABLET BY MOUTH TWO (2) TIMES DAILY (Patient taking differently: TAKE ONE (1) TABLET BY MOUTH ONCE A DAY) 60 tablet 3  . Cholecalciferol 1000 units tablet Take 1 tablet  (1,000 Units total) by mouth daily. 90 tablet 0  . doxycycline (VIBRA-TABS) 100 MG tablet Take 1 tablet (100 mg total) by mouth 2 (two) times daily. 60 tablet 0  . gabapentin (NEURONTIN) 600 MG tablet Take 2 tablets (1,200 mg total) by mouth 3 (three) times daily. 180 tablet 5  . HYDROcodone-acetaminophen (NORCO) 10-325 MG tablet Take 1 tablet by mouth every 8 (eight) hours as needed for moderate pain. (Patient taking differently: Take 1 tablet by mouth 3 (three) times daily. ) 90 tablet 0  . insulin aspart (NOVOLOG) 100 UNIT/ML injection Inject 15 Units into the skin daily with lunch. (Patient taking differently: Inject 15-25 Units into the skin See admin instructions. Take 15 units with lunch and 25 units with supper) 10 mL 0  . Insulin Degludec (TRESIBA FLEXTOUCH)  200 UNIT/ML SOPN Inject 80 Units into the skin daily with breakfast. (Patient taking differently: Inject 85 Units into the skin daily with breakfast. ) 18 mL 0  . Insulin Pen Needle (NOVOFINE) 32G X 6 MM MISC 1 Units by Does not apply route 3 (three) times daily. 100 each 11  . Insulin Pen Needle (PEN NEEDLES) 31G X 6 MM MISC Use to inject insulin daily 100 each 3  . liraglutide (VICTOZA) 18 MG/3ML SOPN INJECT 0.3 MILLILITERS (1.'8MG'$  TOTAL) INTO THE SKIN DAILY 9 mL 2  . naloxone (NARCAN) nasal spray 4 mg/0.1 mL 1 spray into the nose as needed for overdose (Patient taking differently: Place 1 spray into the nose once as needed (overdose). 1 spray into the nose as needed for overdose) 2 kit 0  . nitroGLYCERIN (NITRODUR - DOSED IN MG/24 HR) 0.2 mg/hr patch Place 1 patch (0.2 mg total) onto the skin daily. 30 patch 12  . pantoprazole (PROTONIX) 40 MG tablet Take 1 tablet (40 mg total) by mouth daily. 30 tablet 1  . SYMBICORT 160-4.5 MCG/ACT inhaler INHALE 2 PUFFS INTO THE LUNGS 2 TIMES DAILY (Patient taking differently: INHALE 2 PUFFS INTO THE LUNGS 1 TIME DAILY) 10.2 g 4  . albuterol (PROVENTIL HFA;VENTOLIN HFA) 108 (90 Base) MCG/ACT inhaler  Inhale 1-2 puffs into the lungs every 6 (six) hours as needed for wheezing or shortness of breath. 18 g 5  . amoxicillin-clavulanate (AUGMENTIN) 875-125 MG tablet Take 1 tablet by mouth every 12 (twelve) hours. (Patient not taking: Reported on 03/01/2018) 3 tablet 0  . cetirizine (ZYRTEC) 10 MG tablet Take 1 tablet (10 mg total) by mouth daily. (Patient taking differently: Take 10 mg by mouth daily as needed for allergies. ) 30 tablet 11  . ciprofloxacin (CIPRO) 500 MG tablet Take 1 tablet (500 mg total) by mouth 2 (two) times daily. (Patient not taking: Reported on 03/01/2018) 3 tablet 0  . pentoxifylline (TRENTAL) 400 MG CR tablet Take 1 tablet (400 mg total) by mouth 3 (three) times daily with meals. (Patient not taking: Reported on 03/01/2018) 90 tablet 3  . polyethylene glycol (MIRALAX / GLYCOLAX) packet Take 17 g by mouth daily as needed for mild constipation. (Patient not taking: Reported on 03/01/2018) 14 each 0    Drug Regimen Review Drug regimen was reviewed and remains appropriate with no significant issues identified  Home: Home Living Family/patient expects to be discharged to:: Inpatient rehab Living Arrangements: Spouse/significant other, Children Available Help at Discharge: Family, Available PRN/intermittently Type of Home: House Home Access: Stairs to enter [employer] of Steps: 2(will be getting a ramp) Entrance Stairs-Rails: None Home Layout: One level Bathroom Shower/Tub: Tub only Biochemist, clinical: Standard Bathroom Accessibility: No Home Equipment: Walker - 2 wheels Additional Comments: walker will not fit into bathroom   Functional History: Prior Function Level of Independence: Needs assistance Gait / Transfers Assistance Needed: household ambulation with RW ADL's / Homemaking Assistance Needed: Assistance needed for LB ADL. Comments: Patients husband has been helping with ADLs, working during day, CNA intermittent   Functional Status:  Mobility: Bed  Mobility Overal bed mobility: Needs Assistance Bed Mobility: Sit to Supine Sit to supine: Min assist General bed mobility comments: Seated at EOB (with L residual limb supported on bed)on my arrival.  Transfers Overall transfer level: Needs assistance Equipment used: Rolling walker (2 wheeled) Transfers: Sit to/from Stand, [employer] Transfers Sit to Stand: Min assist Stand pivot transfers: Min assist General transfer comment: Min assist for stability and to  power up.  Ambulation/Gait General Gait Details: unable due to pain     ADL: ADL Overall ADL's : Needs assistance/impaired Eating/Feeding: Set up, Sitting Grooming: Set up, Sitting Upper Body Bathing: Min guard, Sitting Lower Body Bathing: Maximal assistance, Sit to/from stand Upper Body Dressing : Min guard, Sitting Lower Body Dressing: Maximal assistance, Sit to/from stand Toilet Transfer: Minimal assistance, RW, BSC, Stand-pivot Toileting- Clothing Manipulation and Hygiene: Minimal assistance, Sit to/from stand Toileting - Clothing Manipulation Details (indicate cue type and reason): Encouraged pt to problem solve through participation in task.  Functional mobility during ADLs: Minimal assistance(stand-pivot only) General ADL Comments: Pt able to complete toileting tasks today with overall min assist and cues for toileting hygiene strategies.   Cognition: Cognition Overall Cognitive Status: Within Functional Limits for tasks assessed Orientation Level: Oriented X4 Cognition Arousal/Alertness: Awake/alert Behavior During Therapy: WFL for tasks assessed/performed Overall Cognitive Status: Within Functional Limits for tasks assessed  Physical Exam: Blood pressure 131/68, pulse (!) 110, temperature 98.6 F (37 C), temperature source Oral, resp. rate 18, height _0  (1.575 m), weight (!) 146.1 kg (322 lb), last menstrual period 02/17/2018, SpO2 92 %. Physical Exam  Vitals reviewed. Constitutional: She appears  well-developed.  42 year old right-handed obese female  HENT:  Head: Normocephalic and atraumatic.  Eyes: EOM are normal. Right eye exhibits no discharge. Left eye exhibits no discharge.  Neck: Normal range of motion. Neck supple. No thyromegaly present.  Cardiovascular: Normal rate, regular rhythm and normal heart sounds.  Respiratory: Breath sounds normal.  Fair inspiratory effort  GI: Soft. Bowel sounds are normal. She exhibits no distension.  Musculoskeletal:  Left stump TTP  Neurological: She is alert.  Motor: 5/5 bilateral deltoids, biceps, triceps, hand grip 4+/5 Right hip flexion knee extension ankle dorsiflexion 4/5 Left hip flexion Sensation diminished to light touch in left foot  Skin:  +VAC left stump  Psychiatric: She has a normal mood and affect. Her behavior is normal.   Results for orders placed or performed during the hospital encounter of 03/03/18 (from the past 48 hour(s))  Glucose, capillary     Status: Abnormal   Collection Time: 03/04/18 11:39 AM  Result Value Ref Range   Glucose-Capillary 251 (H) 65 - 99 mg/dL  Glucose, capillary     Status: Abnormal   Collection Time: 03/04/18  4:41 PM  Result Value Ref Range   Glucose-Capillary 291 (H) 65 - 99 mg/dL  Glucose, capillary     Status: Abnormal   Collection Time: 03/04/18  7:46 PM  Result Value Ref Range   Glucose-Capillary 264 (H) 65 - 99 mg/dL   Comment 1 Document in Chart   Glucose, capillary     Status: Abnormal   Collection Time: 03/05/18  6:56 AM  Result Value Ref Range   Glucose-Capillary 280 (H) 65 - 99 mg/dL   Comment 1 Document in Chart   Glucose, capillary     Status: Abnormal   Collection Time: 03/05/18 11:24 AM  Result Value Ref Range   Glucose-Capillary 269 (H) 65 - 99 mg/dL  Glucose, capillary     Status: Abnormal   Collection Time: 03/05/18  4:32 PM  Result Value Ref Range   Glucose-Capillary 270 (H) 65 - 99 mg/dL  Glucose, capillary     Status: Abnormal   Collection Time: 03/05/18   9:49 PM  Result Value Ref Range   Glucose-Capillary 356 (H) 65 - 99 mg/dL  Glucose, capillary     Status: Abnormal   Collection Time: 03/06/18  6:22 AM  Result Value Ref Range   Glucose-Capillary 291 (H) 65 - 99 mg/dL   No results found.     Medical Problem List and Plan: 1.  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  Wound VAC in place 2.  DVT Prophylaxis/Anticoagulation: SCD right lower extremity 3. Pain Management/chronic pain syndrome: OxyContin 10 mg every 12 hours, Neurontin 1200 mg 3 times daily, oxycodone and Robaxin as needed 4. Mood/bipolar disorder: Provide emotional support 5. Neuropsych: This patient is capable of making decisions on her own behalf. 6. Skin/Wound Care: Routine skin checks 7. Fluids/Electrolytes/Nutrition: Routine I&O's with follow-up chemistries 8.  Diabetes mellitus with peripheral neuropathy.  Latest hemoglobin A1c 13.8.  NovoLog 6 units 3 times daily, Lantus insulin 42 units every morning, Victoza 1.8 mg every morning.  Check blood sugars before meals and at bedtime.  Diabetic teaching 9.  COPD.  CPAP. 10.  Morbid obesity.  Dietary follow-up 11.  Hypertension.  Coreg 3.125 mg twice daily.  Check blood sugars before meals and at bedtime 12.  Hyperlipidemia.  Lipitor 13.  Constipation.  Laxative assistance  Post Admission Physician Evaluation: 1. Preadmission assessment reviewed and changes made below. 2. Functional deficits secondary  to left BKA. 3. Patient is admitted to receive collaborative, interdisciplinary care between the physiatrist, rehab nursing staff, and therapy team. 4. Patient's level of medical complexity and substantial therapy needs in context of that medical necessity cannot be provided at a lesser intensity of care such as a SNF. 5. Patient has experienced substantial functional loss from his/her baseline which was documented above under the  "Functional History" and "Functional Status" headings.  Judging by the patient's diagnosis, physical exam, and functional history, the patient has potential for functional progress which will result in measurable gains while on inpatient rehab.  These gains will be of substantial and practical use upon discharge  in facilitating mobility and self-care at the household level. 45. Physiatrist will provide 24 hour management of medical needs as well as oversight of the therapy plan/treatment and provide guidance as appropriate regarding the interaction of the two. 7. 24 hour rehab nursing will assist with bowel management, safety, skin/wound care, disease management, pain management and patient education  and help integrate therapy concepts, techniques,education, etc. 8. PT will assess and treat for/with: Lower extremity strength, range of motion, stamina, balance, functional mobility, safety, adaptive techniques and equipment, woundcare, coping skills, pain control, pre-prosthetic education.   Goals are: Mod I at wheelchair level. 9. OT will assess and treat for/with: ADL's, functional mobility, safety, upper extremity strength, adaptive techniques and equipment, wound mgt, ego support, and community reintegration.   Goals are: Mod I at wheelchair level. Therapy may not proceed with showering this patient. 10. Case Management and Social Worker will assess and treat for psychological issues and discharge planning. 11. Team conference will be held weekly to assess progress toward goals and to determine barriers to discharge. 12. Patient will receive at least 3 hours of therapy per day at least 5 days per week. 13. ELOS: 7-11 days.       14. Prognosis:  good  Delice Lesch, MD, ABPMR Lavon Paganini Angiulli, PA-C 03/06/2018

## 2018-03-06 NOTE — PMR Pre-admission (Signed)
PMR Admission Coordinator Pre-Admission Assessment  Patient: [name] is an 42 y.o., female MRN: [id number] DOB: 1976-04-29 Height: 5\' 2"  (157.5 cm) Weight: (!) 146.1 kg (322 lb)              Insurance Information  PRIMARY: Medicaid [place] Access      Policy#: [id number]      Subscriber: pt Benefits:  Phone #: [phone]     Name: 03/06/2018 Eff. Date: active       Medicaid Application Date:       Case Manager:  Disability Application Date:       Case Worker:   Emergency Contact Information Contact Information    Name Relation Home Work Mobile   [place] Spouse [phone]     [place]ha, [place] Mother   [phone]     Current Medical History  Patient Admitting Diagnosis: Left BKA  History of Present Illness: HPI: [name] an 42 y.o. right-handed female patient with history of morbid obesity, diabetes uncontrolled with polyneuropathy on insulin, bipolar disorder, COPD, with recent fourth and fifth ray amputations of the left foot performed on 02/01/2018.  Per chart review patient lives with spouse.  One level home with 2 steps to entry.  Plan is for a ramp.  Household ambulator with a rolling walker.  Husband was assisting with some ADLs and working during the day.  Presented to the office of Dr. Sharol Given on 02/27/2018 with dehiscence of her wound. She was diagnosed with osteomyelitis, surgery was scheduled for 03/03/2018 and underwent left transtibial amputation by Dr. Sharol Given.  Wound VAC applied.Postoperatively patient had pain graded as moderate. She was placed on home CPAP unit. She has a history of chronic pain as well as "drug seeking behavior" on her problem list and was monitored closely.    Past Medical History  Past Medical History:  Diagnosis Date  . Acute osteomyelitis of ankle or foot, left ([place]) 01/31/2018  . Alveolar hypoventilation   . Anemia    not on iron pill  . Arthritis   . Asthma   . Bipolar 2 disorder ([place])   . Carpal tunnel syndrome on right     recurrent  . Cellulitis 08/2010-08/2011  . Cellulitis of left foot 01/27/2018  . Chronic pain   . COPD (chronic obstructive pulmonary disease) ([name])    Symbicort daily and Proventil as needed  . Costochondritis   . Depression   . Diabetes mellitus 2000   Type 2, Uncontrolled.Takes Lantus daily.Fasting blood sugar runs 150  . Dizziness    occasionally  . Drug-seeking behavior   . GERD (gastroesophageal reflux disease)    takes Pantoprazole and Zantac daily  . Headache    migraine-last one about a yr ago.Topamax daily  . HLD (hyperlipidemia)    takes Atorvastatin daily  . Hypertension    takes Lisinopril and Coreg daily  . Morbid obesity ([place])   . Muscle spasm    takes Flexeril as needed  . Nocturia   . Obstructive sleep apnea   . Peripheral neuropathy    takes Gabapentin daily  . Pneumonia    "walking" several yrs ago and as a baby  . Rectal fissure   . Restless leg   . Urinary frequency   . Varicose veins    Right medial thigh and Left leg     Family History  family history includes Cancer in her maternal grandmother; Depression in her mother; Diabetes in her maternal grandmother and mother; Heart attack in her father, paternal grandfather, paternal  grandmother, and paternal uncle; Heart disease in her paternal grandfather and paternal grandmother; Hyperlipidemia in her maternal grandmother and mother; Hypertension in her maternal grandmother; Other in her maternal grandfather; Varicose Veins in her mother.  Prior Rehab/Hospitalizations:  Has the patient had major surgery during 100 days prior to admission? Yes  Current Medications   Current Facility-Administered Medications:  .  0.9 %  sodium chloride infusion, , Intravenous, Continuous, [name], MD, Last Rate: 10 mL/hr at 03/03/18 1800 .  acetaminophen (TYLENOL) tablet 325-650 mg, 325-650 mg, Oral, Q6H PRN, [name], MD .  albuterol (PROVENTIL) (2.5 MG/3ML) 0.083% nebulizer solution 2.5 mg, 2.5 mg,  Inhalation, Q6H PRN, [name], MD .  aspirin tablet 325 mg, 325 mg, Oral, Daily, [name], MD, 325 mg at 03/06/18 0814 .  atorvastatin (LIPITOR) tablet 20 mg, 20 mg, Oral, q1800, [name], MD, 20 mg at 03/05/18 1720 .  bisacodyl (DULCOLAX) suppository 10 mg, 10 mg, Rectal, Daily PRN, [name], MD .  carvedilol (COREG) tablet 3.125 mg, 3.125 mg, Oral, BID WC, [name], MD, Stopped at 03/04/18 1120 .  docusate sodium (COLACE) capsule 100 mg, 100 mg, Oral, BID, [name], MD, 100 mg at 03/06/18 0815 .  gabapentin (NEURONTIN) tablet 1,200 mg, 1,200 mg, Oral, TID, [name], MD, 1,200 mg at 03/06/18 0814 .  HYDROmorphone (DILAUDID) injection 0.5-1 mg, 0.5-1 mg, Intravenous, Q4H PRN, [name], MD, 1 mg at 03/06/18 0241 .  insulin aspart (novoLOG) injection 0-20 Units, 0-20 Units, Subcutaneous, TID WC, [name], MD, 11 Units at 03/06/18 [phone] .  insulin aspart (novoLOG) injection 6 Units, 6 Units, Subcutaneous, TID WC, [name], MD, 6 Units at 03/06/18 [phone] .  insulin glargine (LANTUS) injection 42 Units, 42 Units, Subcutaneous, q morning - 10a, [name], MD, 42 Units at 03/05/18 [phone] .  [START ON 03/07/2018] liraglutide (VICTOZA) SOPN 1.8 mg, 1.8 mg, Subcutaneous, q morning - 10a, [name], MD .  methocarbamol (ROBAXIN) tablet 500 mg, 500 mg, Oral, Q6H PRN, 500 mg at 03/06/18 0826 **OR** methocarbamol (ROBAXIN) 500 mg in dextrose 5 % 50 mL IVPB, 500 mg, Intravenous, Q6H PRN, [name], MD .  metoCLOPramide (REGLAN) tablet 5-10 mg, 5-10 mg, Oral, Q8H PRN **OR** metoCLOPramide (REGLAN) injection 5-10 mg, 5-10 mg, Intravenous, Q8H PRN, [name], MD, 10 mg at 03/06/18 0241 .  mometasone-formoterol (DULERA) 200-5 MCG/ACT inhaler 2 puff, 2 puff, Inhalation, BID, [name], MD .  nitroGLYCERIN (NITRODUR - Dosed in mg/24 hr) patch 0.2 mg, 0.2 mg, Transdermal, Daily, [name], MD .  ondansetron [facility]) tablet 4 mg, 4 mg, Oral,  Q6H PRN **OR** ondansetron (ZOFRAN) injection 4 mg, 4 mg, Intravenous, Q6H PRN, [name], MD, 4 mg at 03/05/18 2130 .  oxyCODONE (Oxy IR/ROXICODONE) immediate release tablet 10-15 mg, 10-15 mg, Oral, Q4H PRN, [name], MD, 15 mg at 03/06/18 0815 .  oxyCODONE (Oxy IR/ROXICODONE) immediate release tablet 5-10 mg, 5-10 mg, Oral, Q4H PRN, [name], MD, 10 mg at 03/05/18 2354 .  oxyCODONE (OXYCONTIN) 12 hr tablet 10 mg, 10 mg, Oral, Q12H, Marlou [place], MD, 10 mg at 03/05/18 2121 .  pantoprazole (PROTONIX) EC tablet 40 mg, 40 mg, Oral, Daily, [name], MD, 40 mg at 03/06/18 0815 .  polyethylene glycol (MIRALAX / GLYCOLAX) packet 17 g, 17 g, Oral, Daily PRN, [name], MD  Patients Current Diet:  Diet Carb Modified Fluid consistency: Thin; Room service appropriate? Yes  Precautions / Restrictions Precautions Precautions: Fall Restrictions Weight Bearing Restrictions: Yes LLE Weight Bearing: Non weight bearing   Has the patient had 2 or more falls or a fall with injury in the past year?No  Prior Activity Level Community (5-7x/wk): patient states she was independent up until one month ago; disabled for 10 years; was a Paramedic / Paramedic Devices/Equipment: CBG Meter, Eyeglasses, CPAP, Crutches, Environmental consultant (specify type), Wheelchair Home Equipment: Walker - 2 wheels  Prior Device Use: Indicate devices/aids used by the patient prior to current illness, exacerbation or injury? Walker  Prior Functional Level Prior Function Level of Independence: Needs assistance Gait / Transfers Assistance Needed: household ambulation with RW ADL's / Homemaking Assistance Needed: Assistance needed for LB ADL. Comments: Patients husband has been helping with ADLs, working during day, CNA intermittent   Self Care: Did the patient need help bathing, dressing, using the toilet or eating?  Needed some help  Indoor Mobility: Did the patient need  assistance with walking from room to room (with or without device)? Needed some help  Stairs: Did the patient need assistance with internal or external stairs (with or without device)? Needed some help  Functional Cognition: Did the patient need help planning regular tasks such as shopping or remembering to take medications? Needed some help  Current Functional Level Cognition  Overall Cognitive Status: Within Functional Limits for tasks assessed Orientation Level: Oriented X4    Extremity Assessment (includes Sensation/Coordination)  Upper Extremity Assessment: Overall WFL for tasks assessed  Lower Extremity Assessment: LLE deficits/detail LLE Deficits / Details: s/p BKA    ADLs  Overall ADL's : Needs assistance/impaired Eating/Feeding: Set up, Sitting Grooming: Set up, Sitting Upper Body Bathing: Min guard, Sitting Lower Body Bathing: Maximal assistance, Sit to/from stand Upper Body Dressing : Min guard, Sitting Lower Body Dressing: Maximal assistance, Sit to/from stand Toilet Transfer: Minimal assistance, RW, BSC, Stand-pivot Toileting- Clothing Manipulation and Hygiene: Minimal assistance, Sit to/from stand Toileting - Clothing Manipulation Details (indicate cue type and reason): Encouraged pt to problem solve through participation in task.  Functional mobility during ADLs: Minimal assistance(stand-pivot only) General ADL Comments: Pt able to complete toileting tasks today with overall min assist and cues for toileting hygiene strategies.     Mobility  Overal bed mobility: Needs Assistance Bed Mobility: Sit to Supine Sit to supine: Min assist General bed mobility comments: Seated at EOB (with L residual limb supported on bed)on my arrival.     Transfers  Overall transfer level: Needs assistance Equipment used: Rolling walker (2 wheeled) Transfers: Sit to/from Stand, [employer] Transfers Sit to Stand: Min assist Stand pivot transfers: Min assist General transfer  comment: Min assist for stability and to power up.     Ambulation / Gait / Stairs / Wheelchair Mobility  Ambulation/Gait General Gait Details: unable due to pain     Posture / Balance Balance Overall balance assessment: Needs assistance Sitting-balance support: No upper extremity supported, Feet supported Sitting balance-Leahy Scale: Fair Standing balance support: Single extremity supported, Bilateral upper extremity supported, During functional activity Standing balance-Leahy Scale: Poor Standing balance comment: Relies on at least single UE support and external assistance during dynamic standing tasks to complete toileting hygiene.     Special needs/care consideration BiPAP/CPAP yes pta CPM n/a Continuous Drip IV n/a Dialysis  N/a Life Vest  N/a Oxygen n/a Special Bed n/a Trach Size n/a Wound Vac yes incision site Skin  surgical incision Bowel mgmt: patient complaints no BM for 1 week. Documented 03/02/2018 Bladder mgmt:  continent Diabetic mgmt yes insulin dependent   Previous Home Environment Living Arrangements: (12)  Lives With: Spouse, Daughter Available Help at Discharge: Family, Available PRN/intermittently(spouse works days; CNA 2 hrs per day) Type of Home: [place]: One level Home Access: Stairs to enter Entrance Stairs-Rails: None Technical brewer of Steps: 2 Bathroom Shower/Tub: Public librarian, Industrial/product designer: No Home Care Services: Yes Type of Home Care Services: Homehealth aide([facility]) [place] (if known): [facility] Additional Comments: walker will not fit into bathroom  Discharge Living Setting Plans for Discharge Living Setting: Patient's home, Lives with (comment)(spouse and daughter) Type of Home at Discharge: House Discharge Home Layout: One level Discharge Home Access: Stairs to enter Entrance Stairs-Rails: None Entrance Stairs-Number of Steps: 2 Discharge Bathroom  Shower/Tub: Tub/shower unit, Curtain Discharge Bathroom Toilet: Standard Discharge Bathroom Accessibility: No Does the patient have any problems obtaining your medications?: No  Social/Family/Support Systems Patient Roles: Spouse, Parent Contact Information: spouse Anticipated Caregiver: spouse and CNA Anticipated Ambulance person Information: see above Ability/Limitations of Caregiver: spouse works days Caregiver Availability: Intermittent Discharge Plan Discussed with Primary Caregiver: Yes Is Caregiver In Agreement with Plan?: Yes Does Caregiver/Family have Issues with Lodging/Transportation while Pt is in Rehab?: No  Goals/Additional Needs Patient/Family Goal for Rehab: Mod I with PT and OT Expected length of stay: ELOS 7 days Special Service Needs: 32 year old daughter has been staying on acute overnight; I disccused with patient that she could not stay for prolonged times due to age; Pt's Mom to come pick her up 03/06/2018 Pt/Family Agrees to Admission and willing to participate: Yes Program Orientation Provided & Reviewed with Pt/Caregiver Including Roles  & Responsibilities: Yes  Decrease burden of Care through IP rehab admission: n/a  Possible need for SNF placement upon discharge:bot anticipated   Patient Condition: This patient's condition remains as documented in the consult dated 03/05/2018, in which the Rehabilitation Physician determined and documented that the patient's condition is appropriate for intensive rehabilitative care in an inpatient rehabilitation facility. Will admit to inpatient rehab today.  Preadmission Screen Completed By:  Cleatrice Burke, 03/06/2018 11:33 AM ______________________________________________________________________   Discussed status with Dr. Posey Pronto on 03/06/2018 at  1139 and received telephone approval for admission today.  Admission Coordinator:  Cleatrice Burke, time 1157 Date 03/06/2018

## 2018-03-06 NOTE — Progress Notes (Signed)
Date of Visit: 02/28/2018   HPI:  Patient presents for routine follow-up.  Allergies: Needs refill of Zyrtec.  Having some runny nose which she corresponds to the increase in pollen recently.  Diabetes: Currently taking Tresiba 80 units daily.  Sugars are running high while fasting.  No low sugars.  Has planned transtibial amputation this coming Friday with Dr. Sharol Given.  She will be sending paperwork to get an in-home aide with increased hours. Otherwise patient is doing well.  Has no complaints.  ROS: See HPI.  [place]: History of hypertension, diabetes, chronic pain, asthma, diabetic neuropathy, GERD, hyperlipidemia  PHYSICAL EXAM: BP 132/78   Pulse 91   Temp 97.7 F (36.5 C) (Oral)   Ht 5\' 2"  (1.575 m)   LMP 02/17/2018   SpO2 99%   BMI 58.89 kg/m  Gen: no acute distress, pleasant, cooperative HEENT: normocephalic, atraumatic  Resp: normal work of breathing, speaks in full sentences without distress Neuro: grossly nonfocal, speech normal Ext: left foot wrapped, did not unwrap  ASSESSMENT/PLAN:  Health maintenance:  -Current on health maintenance items  Diabetes mellitus ([place]) Uncontrolled fasting sugars.  Increase tresiba to 85 units daily.  Further glycemic control will be achieved in the hospital as she is being admitted in a few days for her amputation.  Allergic rhinitis Refill Zyrtec.  FOLLOW UP: Follow up in clinic after her surgery for routine follow-up.   Pray. [name], [place]

## 2018-03-06 NOTE — Progress Notes (Signed)
Cristina Gong, RN  Rehab Admission Coordinator  Physical Medicine and Rehabilitation  PMR Pre-admission  Signed  Date of Service:  03/06/2018 11:33 AM       Related encounter: Admission (Discharged) from 03/03/2018 in [facility]           [] Hide copied text  [] Hover for details   PMR Admission Coordinator Pre-Admission Assessment  Patient: [name] is an 42 y.o., female MRN: [id number] DOB: July 05, 1976 Height: 5\' 2"  (157.5 cm) Weight: (!) 146.1 kg (322 lb)                                                                                                                                                  Insurance Information  PRIMARY: Medicaid [place] Access      Policy#: [id number]      Subscriber: pt Benefits:  Phone #: [phone]     Name: 03/06/2018 Eff. Date: active       Medicaid Application Date:       Case Manager:  Disability Application Date:       Case Worker:   Emergency Contact Information         Contact Information    Name Relation Home Work Mobile   [name],Travis Spouse [phone]     Quintina, Hakeem Mother   [phone]     Current Medical History  Patient Admitting Diagnosis: Left BKA  History of Present Illness: JEH:UDJSH Banksis an 42 y.o.right-handed femalepatient with history ofmorbid obesity,diabetes uncontrolled with polyneuropathy on insulin, bipolar disorder, COPD, with recent fourth and fifth ray amputations of the left foot performed on 02/01/2018. Per chart review patient lives with spouse. One level home with 2 steps to entry. Plan is for a ramp. Household ambulator with a rolling walker. Husband was assisting with some ADLs and working during the day. Presented to the office of Dr. Sharol Given on 02/27/2018 with dehiscence of her wound. She was diagnosed with osteomyelitis, surgery was scheduled for 3/29/2019and underwent left transtibial amputation by Dr.  Sharol Given.Wound VAC applied.Postoperatively patient had pain graded as moderate. She was placed on home CPAP unit. She has a history of chronic pain as well as "drug seeking behavior" on her problem listand was monitored closely.  Past Medical History      Past Medical History:  Diagnosis Date  . Acute osteomyelitis of ankle or foot, left ([place]) 01/31/2018  . Alveolar hypoventilation   . Anemia    not on iron pill  . Arthritis   . Asthma   . Bipolar 2 disorder ([place])   . Carpal tunnel syndrome on right    recurrent  . Cellulitis 08/2010-08/2011  . Cellulitis of left foot 01/27/2018  . Chronic pain   . COPD (chronic obstructive pulmonary disease) ([name])    Symbicort daily and Proventil as  needed  . Costochondritis   . Depression   . Diabetes mellitus 2000   Type 2, Uncontrolled.Takes Lantus daily.Fasting blood sugar runs 150  . Dizziness    occasionally  . Drug-seeking behavior   . GERD (gastroesophageal reflux disease)    takes Pantoprazole and Zantac daily  . Headache    migraine-last one about a yr ago.Topamax daily  . HLD (hyperlipidemia)    takes Atorvastatin daily  . Hypertension    takes Lisinopril and Coreg daily  . Morbid obesity ([place])   . Muscle spasm    takes Flexeril as needed  . Nocturia   . Obstructive sleep apnea   . Peripheral neuropathy    takes Gabapentin daily  . Pneumonia    "walking" several yrs ago and as a baby  . Rectal fissure   . Restless leg   . Urinary frequency   . Varicose veins    Right medial thigh and Left leg     Family History  family history includes Cancer in her maternal grandmother; Depression in her mother; Diabetes in her maternal grandmother and mother; Heart attack in her father, paternal grandfather, paternal grandmother, and paternal uncle; Heart disease in her paternal grandfather and paternal grandmother; Hyperlipidemia in her maternal grandmother and mother; Hypertension in her  maternal grandmother; Other in her maternal grandfather; Varicose Veins in her mother.  Prior Rehab/Hospitalizations:  Has the patient had major surgery during 100 days prior to admission? Yes  Current Medications   Current Facility-Administered Medications:  .  0.9 %  sodium chloride infusion, , Intravenous, Continuous, [name], MD, Last Rate: 10 mL/hr at 03/03/18 1800 .  acetaminophen (TYLENOL) tablet 325-650 mg, 325-650 mg, Oral, Q6H PRN, [name], MD .  albuterol (PROVENTIL) (2.5 MG/3ML) 0.083% nebulizer solution 2.5 mg, 2.5 mg, Inhalation, Q6H PRN, [name], MD .  aspirin tablet 325 mg, 325 mg, Oral, Daily, [name], MD, 325 mg at 03/06/18 0814 .  atorvastatin (LIPITOR) tablet 20 mg, 20 mg, Oral, q1800, [name], MD, 20 mg at 03/05/18 1720 .  bisacodyl (DULCOLAX) suppository 10 mg, 10 mg, Rectal, Daily PRN, [name], MD .  carvedilol (COREG) tablet 3.125 mg, 3.125 mg, Oral, BID WC, [name], MD, Stopped at 03/04/18 1120 .  docusate sodium (COLACE) capsule 100 mg, 100 mg, Oral, BID, [name], MD, 100 mg at 03/06/18 0815 .  gabapentin (NEURONTIN) tablet 1,200 mg, 1,200 mg, Oral, TID, [name], MD, 1,200 mg at 03/06/18 0814 .  HYDROmorphone (DILAUDID) injection 0.5-1 mg, 0.5-1 mg, Intravenous, Q4H PRN, [name], MD, 1 mg at 03/06/18 0241 .  insulin aspart (novoLOG) injection 0-20 Units, 0-20 Units, Subcutaneous, TID WC, [name], MD, 11 Units at 03/06/18 [phone] .  insulin aspart (novoLOG) injection 6 Units, 6 Units, Subcutaneous, TID WC, [name], MD, 6 Units at 03/06/18 [phone] .  insulin glargine (LANTUS) injection 42 Units, 42 Units, Subcutaneous, q morning - 10a, [name], MD, 42 Units at 03/05/18 [phone] .  [START ON 03/07/2018] liraglutide (VICTOZA) SOPN 1.8 mg, 1.8 mg, Subcutaneous, q morning - 10a, [name], MD .  methocarbamol (ROBAXIN) tablet 500 mg, 500 mg, Oral, Q6H PRN, 500 mg at 03/06/18 0826 **OR**  methocarbamol (ROBAXIN) 500 mg in dextrose 5 % 50 mL IVPB, 500 mg, Intravenous, Q6H PRN, [name], MD .  metoCLOPramide (REGLAN) tablet 5-10 mg, 5-10 mg, Oral, Q8H PRN **OR** metoCLOPramide (REGLAN) injection 5-10  mg, 5-10 mg, Intravenous, Q8H PRN, [name], MD, 10 mg at 03/06/18 0241 .  mometasone-formoterol (DULERA) 200-5 MCG/ACT inhaler 2 puff, 2 puff, Inhalation, BID, [name], MD .  nitroGLYCERIN (NITRODUR - Dosed in mg/24 hr) patch 0.2 mg, 0.2 mg, Transdermal, Daily, [name], MD .  ondansetron [facility]) tablet 4 mg, 4 mg, Oral, Q6H PRN **OR** ondansetron (ZOFRAN) injection 4 mg, 4 mg, Intravenous, Q6H PRN, [name], MD, 4 mg at 03/05/18 2130 .  oxyCODONE (Oxy IR/ROXICODONE) immediate release tablet 10-15 mg, 10-15 mg, Oral, Q4H PRN, [name], MD, 15 mg at 03/06/18 0815 .  oxyCODONE (Oxy IR/ROXICODONE) immediate release tablet 5-10 mg, 5-10 mg, Oral, Q4H PRN, [name], MD, 10 mg at 03/05/18 2354 .  oxyCODONE (OXYCONTIN) 12 hr tablet 10 mg, 10 mg, Oral, Q12H, Marlou [place], MD, 10 mg at 03/05/18 2121 .  pantoprazole (PROTONIX) EC tablet 40 mg, 40 mg, Oral, Daily, [name], MD, 40 mg at 03/06/18 0815 .  polyethylene glycol (MIRALAX / GLYCOLAX) packet 17 g, 17 g, Oral, Daily PRN, [name], MD  Patients Current Diet: Diet Carb Modified Fluid consistency: Thin; Room service appropriate? Yes  Precautions / Restrictions Precautions Precautions: Fall Restrictions Weight Bearing Restrictions: Yes LLE Weight Bearing: Non weight bearing   Has the patient had 2 or more falls or a fall with injury in the past year?No  Prior Activity Level Community (5-7x/wk): patient states she was independent up until one month ago; disabled for 10 years; was a Paramedic / Paramedic Devices/Equipment: CBG Meter, Eyeglasses, CPAP, Crutches, Environmental consultant (specify type), Wheelchair Home Equipment: Walker - 2 wheels  Prior  Device Use: Indicate devices/aids used by the patient prior to current illness, exacerbation or injury? Walker  Prior Functional Level Prior Function Level of Independence: Needs assistance Gait / Transfers Assistance Needed: household ambulation with RW ADL's / Homemaking Assistance Needed: Assistance needed for LB ADL. Comments: Patients husband has been helping with ADLs, working during day, CNA intermittent   Self Care: Did the patient need help bathing, dressing, using the toilet or eating?  Needed some help  Indoor Mobility: Did the patient need assistance with walking from room to room (with or without device)? Needed some help  Stairs: Did the patient need assistance with internal or external stairs (with or without device)? Needed some help  Functional Cognition: Did the patient need help planning regular tasks such as shopping or remembering to take medications? Needed some help  Current Functional Level Cognition  Overall Cognitive Status: Within Functional Limits for tasks assessed Orientation Level: Oriented X4    Extremity Assessment (includes Sensation/Coordination)  Upper Extremity Assessment: Overall WFL for tasks assessed  Lower Extremity Assessment: LLE deficits/detail LLE Deficits / Details: s/p BKA    ADLs  Overall ADL's : Needs assistance/impaired Eating/Feeding: Set up, Sitting Grooming: Set up, Sitting Upper Body Bathing: Min guard, Sitting Lower Body Bathing: Maximal assistance, Sit to/from stand Upper Body Dressing : Min guard, Sitting Lower Body Dressing: Maximal assistance, Sit to/from stand Toilet Transfer: Minimal assistance, RW, BSC, Stand-pivot Toileting- Clothing Manipulation and Hygiene: Minimal assistance, Sit to/from stand Toileting - Clothing Manipulation Details (indicate cue type and reason): Encouraged pt to problem solve through participation in task.  Functional mobility during ADLs: Minimal assistance(stand-pivot  only) General ADL Comments: Pt able to complete toileting tasks today with overall min assist and cues for toileting hygiene strategies.     Mobility  Overal bed mobility: Needs Assistance Bed Mobility: Sit to Supine Sit to supine: Min assist General bed mobility comments: Seated at EOB (with L residual limb supported on bed)on my arrival.     Transfers  Overall transfer level: Needs assistance Equipment used: Rolling walker (2 wheeled) Transfers: Sit to/from Stand, [employer] Transfers Sit to Stand: Min assist Stand pivot transfers: Min assist General transfer comment: Min assist for stability and to power up.     Ambulation / Gait / Stairs / Wheelchair Mobility  Ambulation/Gait General Gait Details: unable due to pain     Posture / Balance Balance Overall balance assessment: Needs assistance Sitting-balance support: No upper extremity supported, Feet supported Sitting balance-Leahy Scale: Fair Standing balance support: Single extremity supported, Bilateral upper extremity supported, During functional activity Standing balance-Leahy Scale: Poor Standing balance comment: Relies on at least single UE support and external assistance during dynamic standing tasks to complete toileting hygiene.     Special needs/care consideration BiPAP/CPAP yes pta CPM n/a Continuous Drip IV n/a Dialysis  N/a Life Vest  N/a Oxygen n/a Special Bed n/a Trach Size n/a Wound Vac yes incision site Skin surgical incision Bowel mgmt: patient complaints no BM for 1 week. Documented 03/02/2018 Bladder mgmt:  continent Diabetic mgmt yes insulin dependent   Previous Home Environment Living Arrangements: (12)  Lives With: Spouse, Daughter Available Help at Discharge: Family, Available PRN/intermittently(spouse works days; CNA 2 hrs per day) Type of Home: [place]: One level Home Access: Stairs to enter Entrance Stairs-Rails: None Technical brewer of Steps: 2 Bathroom  Shower/Tub: Public librarian, Industrial/product designer: No Home Care Services: Yes Type of Home Care Services: Homehealth aide([facility]) [place] (if known): [facility] Additional Comments: walker will not fit into bathroom  Discharge Living Setting Plans for Discharge Living Setting: Patient's home, Lives with (comment)(spouse and daughter) Type of Home at Discharge: House Discharge Home Layout: One level Discharge Home Access: Stairs to enter Entrance Stairs-Rails: None Entrance Stairs-Number of Steps: 2 Discharge Bathroom Shower/Tub: Tub/shower unit, Curtain Discharge Bathroom Toilet: Standard Discharge Bathroom Accessibility: No Does the patient have any problems obtaining your medications?: No  Social/Family/Support Systems Patient Roles: Spouse, Parent Contact Information: spouse Anticipated Caregiver: spouse and CNA Anticipated Ambulance person Information: see above Ability/Limitations of Caregiver: spouse works days Caregiver Availability: Intermittent Discharge Plan Discussed with Primary Caregiver: Yes Is Caregiver In Agreement with Plan?: Yes Does Caregiver/Family have Issues with Lodging/Transportation while Pt is in Rehab?: No  Goals/Additional Needs Patient/Family Goal for Rehab: Mod I with PT and OT Expected length of stay: ELOS 7 days Special Service Needs: 63 year old daughter has been staying on acute overnight; I disccused with patient that she could not stay for prolonged times due to age; Pt's Mom to come pick her up 03/06/2018 Pt/Family Agrees to Admission and willing to participate: Yes Program Orientation Provided & Reviewed with Pt/Caregiver Including Roles  & Responsibilities: Yes  Decrease burden of Care through IP rehab admission: n/a  Possible need for SNF placement upon discharge:bot anticipated   Patient Condition: This patient's condition remains as documented in the consult dated  03/05/2018, in which the Rehabilitation Physician determined and documented that the patient's condition is appropriate for intensive rehabilitative care in an inpatient rehabilitation facility. Will admit to inpatient rehab today.  Preadmission Screen Completed By:  Cleatrice Burke, 03/06/2018 11:33 AM ______________________________________________________________________   Discussed status with Dr. Posey Pronto on 03/06/2018 at  1139 and received telephone approval for admission  today.  Admission Coordinator:  Cleatrice Burke, time 2919 Date 03/06/2018             Cosigned by: Jamse Arn, MD at 03/06/2018 11:53 AM  Revision History

## 2018-03-06 NOTE — IPOC Note (Signed)
Overall Plan of Care [facility]) Patient Details Name: [name] MRN: [id number] DOB: 26-Jan-1976  Admitting Diagnosis: Left BKA  Hospital Problems: Active Problems:   Amputation of left lower extremity below knee ([name])   Unilateral complete BKA, left, sequela ([name])   Chronic pain syndrome   Neuropathic pain   Poorly controlled type 2 diabetes mellitus with peripheral neuropathy ([place])   Super-super obese ([name])   Benign essential HTN   Drug-induced constipation   Acute blood loss anemia   Leukocytosis     Functional Problem List: Nursing Behavior, Bladder, Bowel, Medication Management, Pain, Safety, Skin Integrity  PT Balance, Skin Integrity, Edema, Endurance, Motor, Pain, Perception, Safety, Sensory  OT Balance, Endurance, Pain  SLP    TR         Basic ADL's: OT Grooming, Bathing, Dressing, Toileting     Advanced  ADL's: OT Simple Meal Preparation     Transfers: PT Bed Mobility, Bed to Chair, Teacher, early years/pre, Tub/Shower     Locomotion: PT Ambulation, Wheelchair Mobility     Additional Impairments: OT    SLP        TR      Anticipated Outcomes Item Anticipated Outcome  Self Feeding independent  Swallowing      Basic self-care  modified independent  Toileting  modified independent   Bathroom Transfers modified independent to supervision  Bowel/Bladder  Cont B/B LBM 03/03/18  Transfers  modI  Locomotion  S gait with RW, modI w/c propulsion  Communication     Cognition     Pain  Tolerable 6/10, administered pain regimen as need.   Safety/Judgment  free of injuries/falls, Call light at hand, proper footwear.    Therapy Plan: PT Intensity: Minimum of 1-2 x/day ,45 to 90 minutes PT Frequency: 5 out of 7 days PT Duration Estimated Length of Stay: 8-10 days OT Intensity: Minimum of 1-2 x/day, 45 to 90 minutes OT Frequency: 5 out of 7 days OT Duration/Estimated Length of Stay: 8-10 days      Team Interventions: Nursing Interventions Bladder  Management, Bowel Management, Pain Management, Medication Management, Skin Care/Wound Management  PT interventions Ambulation/gait training, Discharge planning, Functional mobility training, Psychosocial support, Therapeutic Activities, Wheelchair propulsion/positioning, Therapeutic Exercise, Skin care/wound management, Neuromuscular re-education, Disease management/prevention, Training and development officer, DME/adaptive equipment instruction, Pain management, UE/LE Strength taining/ROM, Splinting/orthotics, UE/LE Coordination activities, Patient/family education, Community reintegration  OT Interventions Training and development officer, Academic librarian, Discharge planning, Engineer, drilling, Functional mobility training, Patient/family education, Self Care/advanced ADL retraining, Therapeutic Activities, Therapeutic Exercise, UE/LE Strength taining/ROM, UE/LE Coordination activities  SLP Interventions    TR Interventions    SW/CM Interventions Discharge Planning, Psychosocial Support, Patient/Family Education   Barriers to Discharge MD  Medical stability, Weight and Weight bearing restrictions  Nursing      PT Inaccessible home environment, Decreased caregiver support intermittent assist at home, home not accessible to w/c  OT Inaccessible home environment Wheelchair and walker may not fit in different rooms/doorways through the house.    SLP      SW       Team Discharge Planning: Destination: PT-Home ,OT- Home , SLP-  Projected Follow-up: PT-Home health PT, OT-  Home health OT, SLP-  Projected Equipment Needs: PT-None recommended by PT, OT- Tub/shower bench, SLP-  Equipment Details: PT-has RW, w/c with ELR, OT-  Patient/family involved in discharge planning: PT- Patient,  OT-Patient, SLP-   MD ELOS: 8-12 days. Medical Rehab Prognosis:  Good Assessment: 42 y.o. right-handed female patient with history of  morbid obesity, diabetes uncontrolled with polyneuropathy on  insulin, bipolar disorder, COPD, with recent fourth and fifth ray amputations of the left foot performed on 02/01/2018. Presented to the office of Dr. Sharol Given on 02/27/2018 with dehiscence of her wound. She was diagnosed with osteomyelitis, surgery was scheduled for 03/03/2018 and underwent left transtibial amputation by Dr. Sharol Given.  Wound VAC applied.Postoperatively patient had pain graded as moderate. She was placed on home CPAP unit. She has a history of chronic pain as well as "drug seeking behavior" on her problem list and was monitored closely.  Patient with resulting functional deficits with mobility, transfers, self-care.  Will set goals for Mod I with most tasks with PT/OT.  See Team Conference Notes for weekly updates to the plan of care

## 2018-03-06 NOTE — Plan of Care (Signed)
Pt new admit, no noted progressing at time of admission

## 2018-03-06 NOTE — Assessment & Plan Note (Signed)
Refill Zyrtec

## 2018-03-06 NOTE — Progress Notes (Signed)
Kirsteins, [name], MD      [name] [name], MD  Physician  Physical Medicine and Rehabilitation      Consult Note  Signed     Date of Service:  03/05/2018 11:09 AM         Related encounter: Admission (Discharged) from 03/03/2018 in [facility] buttonCollapse widget button     Hide copied text   Hover for detailscustomization button                                                                                                Physical Medicine and Rehabilitation Consult  Reason for Consult: Left transtibial amputation  Referring Phsyician: Meridee Score MD  [name] is an 42 y.o. female.    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 Sharol Given on 02/27/2018 with dehiscence of her wound.  She was diagnosed with osteomyelitis, surgery was scheduled for 03/03/2018.  Postoperatively patient had pain graded as moderate.  She was placed on home CPAP unit.  She has a history of chronic pain as well as "drug seeking behavior" on her problem list.     PT evaluated patient on 03/04/2018, min assist level for transfers, OT eval is pending       Review of Systems -Review of Systems   Constitutional: Negative for chills and fever.   HENT: Negative for ear discharge, hearing loss and nosebleeds.    Eyes: Negative for discharge and redness.   Respiratory: Negative for cough, sputum production, wheezing and stridor.    Cardiovascular: Positive for leg swelling. Negative for chest pain.   Gastrointestinal: Positive for constipation.  Negative for abdominal pain and vomiting.   Genitourinary: Negative for dysuria.   Musculoskeletal: Negative for falls and joint pain.        + left stump pain   Skin: Negative for itching and rash.   Neurological: Positive for sensory change. Negative for dizziness and loss of consciousness.   Psychiatric/Behavioral: Negative for memory loss. The patient is not nervous/anxious.               Past Medical History:    Diagnosis   Date    .   Acute osteomyelitis of ankle or foot, left ([place])   01/31/2018    .   Alveolar hypoventilation        .   Anemia            not on iron pill    .   Arthritis        .   Asthma        .  Bipolar 2 disorder ([place])        .   Carpal tunnel syndrome on right            recurrent    .   Cellulitis   08/2010-08/2011    .   Cellulitis of left foot   01/27/2018    .   Chronic pain        .   COPD (chronic obstructive pulmonary disease) ([name])            Symbicort daily and Proventil as needed    .   Costochondritis        .   Depression        .   Diabetes mellitus   2000        Type 2, Uncontrolled.Takes Lantus daily.Fasting blood sugar runs 150    .   Dizziness            occasionally    .   Drug-seeking behavior        .   GERD (gastroesophageal reflux disease)            takes Pantoprazole and Zantac daily    .   Headache            migraine-last one about a yr ago.Topamax daily    .   HLD (hyperlipidemia)            takes Atorvastatin daily    .   Hypertension            takes Lisinopril and Coreg daily    .   Morbid obesity ([place])        .   Muscle spasm            takes Flexeril as needed    .   Nocturia        .   Obstructive sleep apnea        .   Peripheral neuropathy            takes Gabapentin daily    .   Pneumonia             "walking" several yrs ago and as a baby    .   Rectal fissure        .   Restless leg        .   Urinary frequency        .   Varicose veins            Right medial thigh and Left leg              Past Surgical History:    Procedure   Laterality   Date    .   AMPUTATION   Left   02/01/2018        Procedure: LEFT FOURTH AND 5TH TOE RAY AMPUTATION;  Surgeon: [name], MD;  Location: [facility];  Service: Orthopedics;  Laterality: Left;    .   AMPUTATION   Left   03/03/2018        Procedure: LEFT BELOW KNEE AMPUTATION;  Surgeon: [name], MD;  Location: [facility];  Service: Orthopedics;  Laterality: Left;    .   CARPAL TUNNEL RELEASE   Bilateral        .   CESAREAN SECTION            .   KNEE ARTHROSCOPY   Right   07/17/2010    .  LEFT HEART CATHETERIZATION WITH CORONARY ANGIOGRAM   N/A   07/27/2012        Procedure: LEFT HEART CATHETERIZATION WITH CORONARY ANGIOGRAM;  Surgeon: Sherren Mocha, MD;  Location: [facility] CATH LAB;  Service: Cardiovascular;  Laterality: N/A;    .   left knee surgery                screws she thinks    .   MASS EXCISION   N/A   06/29/2013        Procedure:  WIDE LOCAL EXCISION OF POSTERIOR NECK ABSCESS;  Surgeon: Ralene Ok, MD;  Location: [facility];  Service: General;  Laterality: N/A;             Family History    Problem   Relation   Age of Onset    .   Diabetes   Mother        .   Hyperlipidemia   Mother        .   Depression   Mother        .   Varicose Veins   Mother        .   Heart attack   Paternal Uncle        .   Heart disease   Paternal Grandmother        .   Heart attack   Paternal Grandmother        .   Heart attack   Paternal Grandfather        .   Heart disease   Paternal Grandfather        .   Heart attack   Father         .   Cancer   Maternal Grandmother                COLON    .   Hypertension   Maternal Grandmother        .   Hyperlipidemia   Maternal Grandmother        .   Diabetes   Maternal Grandmother        .   Other   Maternal Grandfather                GUN SHOT       Social History:  reports that she quit smoking about 24 years ago. Her smoking use included cigarettes. She has a 0.09 pack-year smoking history. She has never used smokeless tobacco. She reports that she has current or past drug history. She reports that she does not drink alcohol.  Allergies:         Allergies    Allergen   Reactions    .   Kiwi Extract   Shortness Of Breath and Swelling    .   Trental [Pentoxifylline]   Nausea And Vomiting    .   Nubain [Nalbuphine Hcl]   Other (See Comments)            "FEELS LIKE SOMETHING CRAWLING ON ME"              Medications Prior to Admission    Medication   Sig   Dispense   Refill    .   ACCU-CHEK GUIDE test strip   USE T0 CHECK BLOOD SUGAR 3 TIMES DAILY   100 each   5    .   ACCU-CHEK SOFTCLIX LANCETS lancets   Use as instructed to check blood sugar  3 times per day   100 each   12    .   atorvastatin (LIPITOR) 20 MG tablet   Take 1 tablet (20 mg total) by mouth daily.   30 tablet   5    .   Blood Glucose Monitoring Suppl (ACCU-CHEK NANO SMARTVIEW) w/Device KIT   Use to check blood sugar 3 times daily   1 kit   0    .   carvedilol (COREG) 3.125 MG tablet   TAKE ONE (1) TABLET BY MOUTH TWO (2) TIMES DAILY (Patient taking differently: TAKE ONE (1) TABLET BY MOUTH ONCE A DAY)   60 tablet   3    .   Cholecalciferol 1000 units tablet   Take 1 tablet (1,000 Units total) by mouth daily.   90 tablet   0    .   doxycycline (VIBRA-TABS) 100 MG tablet   Take 1 tablet (100 mg total) by mouth 2 (two) times daily.   60 tablet   0    .    gabapentin (NEURONTIN) 600 MG tablet   Take 2 tablets (1,200 mg total) by mouth 3 (three) times daily.   180 tablet   5    .   HYDROcodone-acetaminophen (NORCO) 10-325 MG tablet   Take 1 tablet by mouth every 8 (eight) hours as needed for moderate pain. (Patient taking differently: Take 1 tablet by mouth 3 (three) times daily. )   90 tablet   0    .   insulin aspart (NOVOLOG) 100 UNIT/ML injection   Inject 15 Units into the skin daily with lunch. (Patient taking differently: Inject 15-25 Units into the skin See admin instructions. Take 15 units with lunch and 25 units with supper)   10 mL   0    .   Insulin Degludec (TRESIBA FLEXTOUCH) 200 UNIT/ML SOPN   Inject 80 Units into the skin daily with breakfast. (Patient taking differently: Inject 85 Units into the skin daily with breakfast. )   18 mL   0    .   Insulin Pen Needle (NOVOFINE) 32G X 6 MM MISC   1 Units by Does not apply route 3 (three) times daily.   100 each   11    .   Insulin Pen Needle (PEN NEEDLES) 31G X 6 MM MISC   Use to inject insulin daily   100 each   3    .   liraglutide (VICTOZA) 18 MG/3ML SOPN   INJECT 0.3 MILLILITERS (1.8MG TOTAL) INTO THE SKIN DAILY   9 mL   2    .   naloxone (NARCAN) nasal spray 4 mg/0.1 mL   1 spray into the nose as needed for overdose (Patient taking differently: Place 1 spray into the nose once as needed (overdose). 1 spray into the nose as needed for overdose)   2 kit   0    .   nitroGLYCERIN (NITRODUR - DOSED IN MG/24 HR) 0.2 mg/hr patch   Place 1 patch (0.2 mg total) onto the skin daily.   30 patch   12    .   pantoprazole (PROTONIX) 40 MG tablet   Take 1 tablet (40 mg total) by mouth daily.   30 tablet   1    .   SYMBICORT 160-4.5 MCG/ACT inhaler   INHALE 2 PUFFS INTO THE LUNGS 2 TIMES DAILY (Patient taking differently: INHALE 2 PUFFS INTO THE LUNGS 1 TIME DAILY)   10.2 g  4    .   albuterol  (PROVENTIL HFA;VENTOLIN HFA) 108 (90 Base) MCG/ACT inhaler   Inhale 1-2 puffs into the lungs every 6 (six) hours as needed for wheezing or shortness of breath.   18 g   5    .   amoxicillin-clavulanate (AUGMENTIN) 875-125 MG tablet   Take 1 tablet by mouth every 12 (twelve) hours. (Patient not taking: Reported on 03/01/2018)   3 tablet   0    .   cetirizine (ZYRTEC) 10 MG tablet   Take 1 tablet (10 mg total) by mouth daily. (Patient taking differently: Take 10 mg by mouth daily as needed for allergies. )   30 tablet   11    .   ciprofloxacin (CIPRO) 500 MG tablet   Take 1 tablet (500 mg total) by mouth 2 (two) times daily. (Patient not taking: Reported on 03/01/2018)   3 tablet   0    .   pentoxifylline (TRENTAL) 400 MG CR tablet   Take 1 tablet (400 mg total) by mouth 3 (three) times daily with meals. (Patient not taking: Reported on 03/01/2018)   90 tablet   3    .   polyethylene glycol (MIRALAX / GLYCOLAX) packet   Take 17 g by mouth daily as needed for mild constipation. (Patient not taking: Reported on 03/01/2018)   14 each   0          Home:  Home Living  Family/patient expects to be discharged to:: Inpatient rehab  Living Arrangements: Spouse/significant other, Children  Available Help at Discharge: Family, Available PRN/intermittently  Type of Home: House  Home Access: Stairs to enter  [employer] of Steps: 2(Patient will be getting a ramp )  Entrance Stairs-Rails: None  Home Layout: One level  Bathroom Shower/Tub: Tub only  Biochemist, clinical: Standard  Bathroom Accessibility: Yes  Home Equipment: Walker - 2 wheels   Functional History:  Prior Function  Comments: Patients husband has been helping with ADLs, working during day, CNA intermittent   Functional Status:   Mobility:      Ambulation/Gait  General Gait Details: unable due to pain        ADL:        Cognition:  Cognition  Orientation Level: Oriented X4  Cognition  Arousal/Alertness: Awake/alert  Behavior During Therapy: WFL for tasks assessed/performed     Blood pressure 94/80, pulse (!) 102, temperature 98.2 F (36.8 C), temperature source Axillary, resp. rate 18, height '5\' 2"'  (1.575 m), weight (!) 146.1 kg (322 lb), last menstrual period 02/17/2018, SpO2 93 %.  Physical Exam   Nursing note and vitals reviewed.  Constitutional: She is oriented to person, place, and time. She appears well-developed. No distress.  Obese   HENT:   Head: Normocephalic and atraumatic.   Eyes: Pupils are equal, round, and reactive to light. Conjunctivae are normal. No scleral icterus.   Neck: Normal range of motion. No JVD present.   Cardiovascular: Normal rate, regular rhythm and normal heart sounds. Exam reveals no friction rub.   No murmur heard.  Respiratory: Effort normal and breath sounds normal. Stridor present. No respiratory distress. She has no wheezes.   GI: Soft. Bowel sounds are normal. She exhibits no distension. There is no tenderness.  Musculoskeletal:  Left BKA stump mild to moderate edema no pain with flexion extension of the knee  Neurological: She is alert and oriented to person, place, and time.  Her strength is 5/5 bilateral deltoid bicep tricep  grip right hip flexion knee extension ankle dorsiflexion Sensation intact light touch in the right foot.   Skin: Skin is warm and dry. She is not diaphoretic.  Psychiatric: She has a normal mood and affect. Her behavior is normal. Judgment and thought content normal.         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 Yes   2.Co-Morbidities requiring supervision/potential complications: Uncontrolled diabetes with polyneuropathy, history of COPD, morbid obesity, OSA   3.Due to bladder management, bowel management, safety, skin/wound care, disease management, medication administration, pain management and patient education, does the patient require 24 hr/day rehab nursing? Yes   4.Does the patient require coordinated care of a physician, rehab nurse, PT (1-2 hrs/day, 5 days/week) and OT (1-2 hrs/day, 5 days/week) to address physical and functional deficits in the context of the above medical diagnosis(es)? Yes Addressing deficits in the following areas: balance, endurance, locomotion, strength, transferring, bowel/bladder control, bathing, dressing and psychosocial support   5.Can the patient actively participate in an intensive therapy program of at least 3 hrs of therapy per day at least 5 days per week? Yes   6.The potential for patient to make measurable gains while on inpatient rehab is excellent   7.Anticipated functional outcomes upon discharge from inpatients are Mod I PT, Mod I  OT, NA SLP   8.Estimated rehab length of stay to reach the above functional goals is: 7d   9.Does the patient have adequate social supports to accommodate these discharge functional goals? Yes   10.Anticipated D/C setting: Home   11.Anticipated post D/C treatments: [place] therapy   12.Overall Rehab/Functional Prognosis: excellent      RECOMMENDATIONS:  This patient's condition is appropriate for continued rehabilitative care in the following setting: CIR  Patient has agreed to participate in  recommended program. Yes  Note that insurance prior authorization may be required for reimbursement for recommended care.     Comment:Needs to be off IV pain med prior to [place]  03/05/2018  Routing History

## 2018-03-06 NOTE — Assessment & Plan Note (Signed)
>>  ASSESSMENT AND PLAN FOR INSULIN DEPENDENT TYPE 2 DIABETES MELLITUS ([place]) WRITTEN ON 03/06/2018  2:41 PM BY Nicholle Falzon, [name], MD  Uncontrolled fasting sugars.  Increase tresiba to 85 units daily.  Further glycemic control will be achieved in the hospital as she is being admitted in a few days for her amputation.

## 2018-03-06 NOTE — Progress Notes (Signed)
Patient admitted approximately 1446 from 5N. Patient alert and oriented x 4 . Left BKA noted with wounc VAC intact. Oriented patient to room and call bell system. Patient verbalized understanding of admission process.  Continue with plan of care.  Mliss Sax

## 2018-03-06 NOTE — Progress Notes (Addendum)
I met with patient at bedside to discuss goals and expectations of an inpt rehab admission. She prefers inpt rehab rather than SNF. Patient states she is ready to transition off IV pain meds. I contacted Dr. Sharol Given and I will arrange admission today. RN CM is aware. [phone]

## 2018-03-07 ENCOUNTER — Other Ambulatory Visit: Payer: Self-pay | Admitting: *Deleted

## 2018-03-07 ENCOUNTER — Inpatient Hospital Stay (HOSPITAL_COMMUNITY): Payer: Self-pay

## 2018-03-07 ENCOUNTER — Inpatient Hospital Stay (HOSPITAL_COMMUNITY): Payer: Self-pay | Admitting: Occupational Therapy

## 2018-03-07 ENCOUNTER — Inpatient Hospital Stay (HOSPITAL_COMMUNITY): Payer: Self-pay | Admitting: Physical Therapy

## 2018-03-07 DIAGNOSIS — D72829 Elevated white blood cell count, unspecified: Secondary | ICD-10-CM

## 2018-03-07 DIAGNOSIS — D62 Acute posthemorrhagic anemia: Secondary | ICD-10-CM

## 2018-03-07 LAB — CBC WITH DIFFERENTIAL/PLATELET
BASOS ABS: 0 10*3/uL (ref 0.0–0.1)
Basophils Relative: 0 %
EOS ABS: 0.3 10*3/uL (ref 0.0–0.7)
EOS PCT: 3 %
HCT: 27 % — ABNORMAL LOW (ref 36.0–46.0)
Hemoglobin: 8.6 g/dL — ABNORMAL LOW (ref 12.0–15.0)
LYMPHS ABS: 3.3 10*3/uL (ref 0.7–4.0)
LYMPHS PCT: 28 %
MCH: 28.3 pg (ref 26.0–34.0)
MCHC: 31.9 g/dL (ref 30.0–36.0)
MCV: 88.8 fL (ref 78.0–100.0)
MONO ABS: 1.2 10*3/uL — AB (ref 0.1–1.0)
Monocytes Relative: 10 %
Neutro Abs: 6.8 10*3/uL (ref 1.7–7.7)
Neutrophils Relative %: 59 %
PLATELETS: 311 10*3/uL (ref 150–400)
RBC: 3.04 MIL/uL — AB (ref 3.87–5.11)
RDW: 13.2 % (ref 11.5–15.5)
WBC: 11.6 10*3/uL — AB (ref 4.0–10.5)

## 2018-03-07 LAB — COMPREHENSIVE METABOLIC PANEL
ALT: 10 U/L — AB (ref 14–54)
AST: 10 U/L — ABNORMAL LOW (ref 15–41)
Albumin: 2.4 g/dL — ABNORMAL LOW (ref 3.5–5.0)
Alkaline Phosphatase: 105 U/L (ref 38–126)
Anion gap: 11 (ref 5–15)
BUN: 20 mg/dL (ref 6–20)
CHLORIDE: 96 mmol/L — AB (ref 101–111)
CO2: 25 mmol/L (ref 22–32)
Calcium: 8.3 mg/dL — ABNORMAL LOW (ref 8.9–10.3)
Creatinine, Ser: 0.85 mg/dL (ref 0.44–1.00)
Glucose, Bld: 258 mg/dL — ABNORMAL HIGH (ref 65–99)
POTASSIUM: 3.8 mmol/L (ref 3.5–5.1)
SODIUM: 132 mmol/L — AB (ref 135–145)
Total Bilirubin: 0.4 mg/dL (ref 0.3–1.2)
Total Protein: 6.7 g/dL (ref 6.5–8.1)

## 2018-03-07 LAB — GLUCOSE, CAPILLARY
GLUCOSE-CAPILLARY: 220 mg/dL — AB (ref 65–99)
GLUCOSE-CAPILLARY: 229 mg/dL — AB (ref 65–99)
Glucose-Capillary: 169 mg/dL — ABNORMAL HIGH (ref 65–99)
Glucose-Capillary: 233 mg/dL — ABNORMAL HIGH (ref 65–99)

## 2018-03-07 MED ORDER — TRAMADOL HCL 50 MG PO TABS
50.0000 mg | ORAL_TABLET | Freq: Four times a day (QID) | ORAL | Status: DC | PRN
  Administered 2018-03-07 – 2018-03-15 (×22): 50 mg via ORAL
  Filled 2018-03-07 (×23): qty 1

## 2018-03-07 MED ORDER — ENOXAPARIN SODIUM 80 MG/0.8ML ~~LOC~~ SOLN
0.5000 mg/kg | SUBCUTANEOUS | Status: DC
  Administered 2018-03-07 – 2018-03-16 (×10): 75 mg via SUBCUTANEOUS
  Filled 2018-03-07 (×10): qty 0.75

## 2018-03-07 NOTE — Progress Notes (Signed)
Physical Therapy Session Note  Patient Details  Name: [name] MRN: [id number] Date of Birth: [birth year]   Today's Date: 03/07/2018 PT Individual Time: 6222-9798 PT Individual Time Calculation (min): 72 min   Short Term Goals: Week 1:  PT Short Term Goal 1 (Week 1): =LTG due to estimated LOS  Skilled Therapeutic Interventions/Progress Updates:    Pt seated EOB with social worker upon PT arrival, agreeable to therapy tx and reports pain 6/10 in L LE. Pt performed stand pivot with RW min assist from bed>bedside commode. Pt performed sit<>stand from commode with min assist and RW, standing balance with single UE support to perform clothing management, min assist. Pt transferred from commode>w/c stand pivot with min assist and RW, verbal cues for techniques. Pt worked on community distance w/c propulsion with B UEs in order to propel from [employer] tower>3 central>back to unit, propelling 100-200 ft bouts with B UEs, breaks as needed secondary to fatigue. During w/c propulsion pt also worked on navigating around obstacles and through doorways throughout the hospital. In the therapy gym pt performed sit<>stands at high table for UE support with min assist, worked on standing balance while performing UE activity with single UE supported on table. Pt propelled back to room and transferred to bed squat pivot with min assist, left supine in bed with needs in reach and bed alarm set.   Therapy Documentation Precautions:  Restrictions Weight Bearing Restrictions: Yes LLE Weight Bearing: Non weight bearing   See Function Navigator for Current Functional Status.   Therapy/Group: Individual Therapy  Netta Corrigan, PT, DPT 03/07/2018, 12:57 PM

## 2018-03-07 NOTE — Progress Notes (Signed)
RT NOTE:  Pt has home CPAP @ beside. Pt manages.

## 2018-03-07 NOTE — Evaluation (Signed)
Occupational Therapy Assessment and Plan  Patient Details  Name: [name] MRN: [id number] Date of Birth: 05/19/1976  OT Diagnosis: acute pain and muscle weakness (generalized) Rehab Potential: Rehab Potential (ACUTE ONLY): Good ELOS: 8-10 days   Today's Date: 03/07/2018 OT Individual Time: 4742-5956 OT Individual Time Calculation (min): 62 min     Problem List:  Patient Active Problem List   Diagnosis Date Noted  . Acute blood loss anemia   . Leukocytosis   . Allergic rhinitis 03/06/2018  . Amputation of left lower extremity below knee ([place]) 03/06/2018  . Unilateral complete BKA, left, sequela ([place])   . Chronic pain syndrome   . Neuropathic pain   . Poorly controlled type 2 diabetes mellitus with peripheral neuropathy ([place])   . Super-super obese ([place])   . Benign essential HTN   . Drug-induced constipation   . Acquired absence of leg below knee ([place]) 03/03/2018  . Dehiscence of amputation stump ([place])   . Amputated toe of left foot ([place]) 02/10/2018  . Acute osteomyelitis of ankle or foot, left ([place]) 01/31/2018  . Acute on chronic blood loss anemia   . AKI (acute kidney injury) ([place])   . Type 2 diabetes mellitus with foot ulcer, with long-term current use of insulin ([place])   . Chronic obstructive pulmonary disease ([place])   . Subacute osteomyelitis, left ankle and foot ([place])   . Diabetic foot ulcer ([place]) 10/29/2017  . Ulcer of skin ([place]) 10/14/2017  . Decreased pedal pulses 06/10/2017  . Adjustment disorder with mixed anxiety and depressed mood 11/04/2016  . History of medication noncompliance 11/04/2016  . Unilateral primary osteoarthritis, right knee 10/22/2016  . Diabetic neuropathy ([place]) 07/14/2016  . Syncope 02/25/2016  . De Quervain's tenosynovitis, bilateral 11/01/2015  . Vitamin D deficiency 09/05/2015  . Recurrent candidiasis of vagina 09/05/2015  . Varicose veins of leg with complications 38/75/6433  . Neuropathy, intercostal nerve 12/17/2014  . Rectal itching  11/17/2014  . Restless leg syndrome 10/17/2014  . Chronic sinusitis 07/18/2014  . Headache 07/15/2014  . Vision, loss, sudden 07/12/2014  . Urge incontinence 10/15/2013  . Encounter for chronic pain management 06/30/2013  . Low back pain 01/31/2013  . HLD (hyperlipidemia) 11/19/2012  . Chest pain 06/27/2012  . Right carpal tunnel syndrome 09/01/2011  . Bilateral knee pain 09/01/2011  . Diabetes mellitus ([place]) 05/22/2008  . Morbid obesity ([place]) 05/22/2008  . 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 Depression 05/22/2008  . Obstructive sleep apnea 05/22/2008  . Hypertension 05/22/2008  . Asthma 05/22/2008  . GERD 05/22/2008    Past Medical History:  Past Medical History:  Diagnosis Date  . Acute osteomyelitis of ankle or foot, left ([place]) 01/31/2018  . Alveolar hypoventilation   . Anemia    not on iron pill  . Arthritis   . Asthma   . Bipolar 2 disorder ([place])   . Carpal tunnel syndrome on right    recurrent  . Cellulitis 08/2010-08/2011  . Cellulitis of left foot 01/27/2018  . Chronic pain   . COPD (chronic obstructive pulmonary disease) ([name])    Symbicort daily and Proventil as needed  . Costochondritis   . Depression   . Diabetes mellitus 2000   Type 2, Uncontrolled.Takes Lantus daily.Fasting blood sugar runs 150  . Dizziness    occasionally  . Drug-seeking behavior   . GERD (gastroesophageal reflux disease)    takes Pantoprazole and Zantac daily  . Headache    migraine-last one about a yr ago.Topamax daily  . HLD (  hyperlipidemia)    takes Atorvastatin daily  . Hypertension    takes Lisinopril and Coreg daily  . Morbid obesity ([place])   . Muscle spasm    takes Flexeril as needed  . Nocturia   . Obstructive sleep apnea   . Peripheral neuropathy    takes Gabapentin daily  . Pneumonia    "walking" several yrs ago and as a baby  . Rectal fissure   . Restless leg   . Urinary frequency   . Varicose veins    Right medial thigh and Left leg    Past Surgical  History:  Past Surgical History:  Procedure Laterality Date  . AMPUTATION Left 02/01/2018   Procedure: LEFT FOURTH AND 5TH TOE RAY AMPUTATION;  Surgeon: [name], MD;  Location: [facility];  Service: Orthopedics;  Laterality: Left;  . AMPUTATION Left 03/03/2018   Procedure: LEFT BELOW KNEE AMPUTATION;  Surgeon: [name], MD;  Location: [facility];  Service: Orthopedics;  Laterality: Left;  . CARPAL TUNNEL RELEASE Bilateral   . CESAREAN SECTION    . KNEE ARTHROSCOPY Right 07/17/2010  . LEFT HEART CATHETERIZATION WITH CORONARY ANGIOGRAM N/A 07/27/2012   Procedure: LEFT HEART CATHETERIZATION WITH CORONARY ANGIOGRAM;  Surgeon: Sherren Mocha, MD;  Location: [facility] CATH LAB;  Service: Cardiovascular;  Laterality: N/A;  . left knee surgery     screws she thinks  . MASS EXCISION N/A 06/29/2013   Procedure:  WIDE LOCAL EXCISION OF POSTERIOR NECK ABSCESS;  Surgeon: Ralene Ok, MD;  Location: [place];  Service: General;  Laterality: N/A;    Assessment & Plan Clinical Impression: Patient is a 42 y.o. year old female with recent admission to the hospital on 03/03/2018 and underwent left transtibial amputation by Dr. Sharol Given.  Wound VAC applied. Patient transferred to CIR on 03/06/2018 .    Patient currently requires mod with basic self-care skills secondary to muscle weakness and decreased standing balance and decreased balance strategies.  Prior to hospitalization, patient could complete ADLs with supervision to min assist.  Patient will benefit from skilled intervention to decrease level of assist with basic self-care skills and increase independence with basic self-care skills prior to discharge home with care partner.  Anticipate patient will require intermittent supervision and follow up home health.  OT - End of Session Activity Tolerance: Tolerates 30+ min activity with multiple rests Endurance Deficit: Yes Endurance Deficit Description: fatigues quickly with mobility activities OT Assessment Rehab  Potential (ACUTE ONLY): Good OT Barriers to Discharge: Inaccessible home environment OT Barriers to Discharge Comments: Wheelchair and walker may not fit in different rooms/doorways through the house.   OT Patient demonstrates impairments in the following area(s): Balance;Endurance;Pain OT Basic ADL's Functional Problem(s): Grooming;Bathing;Dressing;Toileting OT Advanced ADL's Functional Problem(s): Simple Meal Preparation OT Transfers Functional Problem(s): Toilet;Tub/Shower OT Plan OT Intensity: Minimum of 1-2 x/day, 45 to 90 minutes OT Frequency: 5 out of 7 days OT Duration/Estimated Length of Stay: 8-10 days OT Treatment/Interventions: Balance/vestibular training;Community reintegration;Discharge planning;DME/adaptive equipment instruction;Functional mobility training;Patient/family education;Self Care/advanced ADL retraining;Therapeutic Activities;Therapeutic Exercise;UE/LE Strength taining/ROM;UE/LE Coordination activities OT Self Feeding Anticipated Outcome(s): independent OT Basic Self-Care Anticipated Outcome(s): modified independent OT Toileting Anticipated Outcome(s): modified independent OT Bathroom Transfers Anticipated Outcome(s): modified independent to supervision OT Recommendation Patient destination: Home Follow Up Recommendations: Home health OT Equipment Recommended: Tub/shower bench   Skilled Therapeutic Intervention Pt began working on selfcare retraining sit to stand from the EOB.  She completed all UB selfcare with supervision and then completed LB bathing sit to stand  with min assist.  Therapist had to assist pt with washing buttocks as she stated she can reach them when sitting on the [facility] or toilet but not with standing.  May benefit form LH sponge to help with reaching.  She needed assist with pulling underpants and pants over hips in standing as well as for stand pivot transfer to the wheelchair with use of the RW.  Finished session with pt up in the wheelchair with  call button and phone in reach.    OT Evaluation Precautions/Restrictions  Precautions Precautions: Fall Restrictions Weight Bearing Restrictions: Yes LLE Weight Bearing: Non weight bearing  Pain Pain Assessment Pain Scale: 0-10 Pain Score: 4  Pain Type: Surgical pain Pain Location: Leg Pain Orientation: Left Pain Descriptors / Indicators: Aching Pain Frequency: Intermittent Pain Onset: On-going Patients Stated Pain Goal: 2 Pain Intervention(s): Medication (See eMAR) Home Living/Prior [place] expects to be discharged to:: Private residence Living Arrangements: Spouse/significant other, Children Available Help at Discharge: Family, Available PRN/intermittently Type of Home: House Home Access: Stairs to enter Technical brewer of Steps: 2 Entrance Stairs-Rails: None Home Layout: One level Bathroom Shower/Tub: Tub/shower unit, Architectural technologist: Standard Bathroom Accessibility: No Additional Comments: walker will not fit into bathroom  Lives With: Spouse, Daughter IADL History Homemaking Responsibilities: Yes Meal Prep Responsibility: Primary Laundry Responsibility: Secondary Homemaking Comments: Pt has [place] aide for 2 hrs a day, 7 days a week Current License: Yes Mode of Transportation: Car Occupation: On disability Prior Function Level of Independence: Independent with basic ADLs Comments: Patients husband has been helping with ADLs, working during day, CNA intermittent. Working on ramp to enter home. ADL  See Function Section of chart for details  Vision Baseline Vision/History: Wears glasses Wears Glasses: At all times Patient Visual Report: No change from baseline Vision Assessment?: No apparent visual deficits Perception  Perception: Within Functional Limits Praxis Praxis: Intact Cognition Overall Cognitive Status: Within Functional Limits for tasks assessed Arousal/Alertness: Awake/alert Orientation Level:  Person;Situation;Place Person: Oriented Place: Oriented Situation: Oriented Year: 2019 Month: April Day of Week: Correct Memory: Appears intact Immediate Memory Recall: Sock;Blue;Bed Memory Recall: Sock;Blue;Bed Memory Recall Sock: Without Cue Memory Recall Blue: Without Cue Memory Recall Bed: Without Cue Attention: Sustained;Focused Focused Attention: Appears intact Sustained Attention: Appears intact Awareness: Appears intact Problem Solving: Appears intact Safety/Judgment: Appears intact Sensation Sensation Light Touch: Appears Intact Stereognosis: Appears Intact Hot/Cold: Appears Intact Proprioception: Appears Intact Additional Comments: Sensation intact in BUEs Coordination Gross Motor Movements are Fluid and Coordinated: Yes Fine Motor Movements are Fluid and Coordinated: Yes Motor  Motor Motor: Within Functional Limits Mobility  Bed Mobility Bed Mobility: Sit to Supine;Supine to Sit Supine to Sit: 5: Supervision;With rails Supine to Sit Details: Verbal cues for technique;Verbal cues for precautions/safety Sit to Supine: 5: Supervision;With rail Sit to Supine - Details: Verbal cues for technique;Verbal cues for precautions/safety Transfers Transfers: Sit to Stand;Stand to Sit Sit to Stand: 4: Min assist;With armrests;With upper extremity assist Sit to Stand Details: Verbal cues for technique;Verbal cues for precautions/safety;Verbal cues for safe use of DME/AE Stand to Sit: 4: Min assist;With upper extremity assist;To bed  Trunk/Postural Assessment  Cervical Assessment Cervical Assessment: Within Functional Limits Thoracic Assessment Thoracic Assessment: Within Functional Limits Lumbar Assessment Lumbar Assessment: Within Functional Limits Postural Control Postural Control: Within Functional Limits  Balance Balance Balance Assessed: Yes Static Sitting Balance Static Sitting - Balance Support: Feet supported Static Sitting - Level of Assistance: 6:  Modified independent (Device/Increase time) Dynamic Sitting Balance Dynamic Sitting -  Balance Support: During functional activity Dynamic Sitting - Level of Assistance: 5: Stand by assistance Static Standing Balance Static Standing - Balance Support: During functional activity Static Standing - Level of Assistance: 4: Min assist Dynamic Standing Balance Dynamic Standing - Balance Support: During functional activity;Bilateral upper extremity supported Dynamic Standing - Level of Assistance: 4: Min assist Extremity/Trunk Assessment RUE Assessment RUE Assessment: Within Functional Limits LUE Assessment LUE Assessment: Within Functional Limits   See Function Navigator for Current Functional Status.   Refer to Care Plan for Long Term Goals  Recommendations for other services: None    Discharge Criteria: Patient will be discharged from OT if patient refuses treatment 3 consecutive times without medical reason, if treatment goals not met, if there is a change in medical status, if patient makes no progress towards goals or if patient is discharged from hospital.  The above assessment, treatment plan, treatment alternatives and goals were discussed and mutually agreed upon: by patient  Quorra Rosene OTR/L 03/07/2018, 4:47 PM

## 2018-03-07 NOTE — Progress Notes (Signed)
[place] PHYSICAL MEDICINE & REHABILITATION     PROGRESS NOTE  Subjective/Complaints:  Pt seen lying in bed this AM.  She states she slept better overnight, but had some pain.  She feels better than she did yesterday and is ready to begin therapies.   ROS: Denies CP, SOB, N/V/D.  Objective: Vital Signs: Blood pressure 124/68, pulse (!) 105, temperature 98.3 F (36.8 C), temperature source Oral, resp. rate 18, height 5\' 2"  (1.575 m), weight (!) 146.7 kg (323 lb 6.6 oz), last menstrual period 02/17/2018, SpO2 95 %. No results found. Recent Labs    03/07/18 0646  WBC 11.6*  HGB 8.6*  HCT 27.0*  PLT 311   No results for input(s): NA, K, CL, GLUCOSE, BUN, CREATININE, CALCIUM in the last 72 hours.  Invalid input(s): CO CBG (last 3)  Recent Labs    03/06/18 1848 03/06/18 2053 03/07/18 0645  GLUCAP 178* 222* 220*    Wt Readings from Last 3 Encounters:  03/06/18 (!) 146.7 kg (323 lb 6.6 oz)  03/03/18 (!) 146.1 kg (322 lb)  02/23/18 (!) 146.1 kg (322 lb)    Physical Exam:  BP 124/68 (BP Location: Right Arm)   Pulse (!) 105   Temp 98.3 F (36.8 C) (Oral)   Resp 18   Ht 5\' 2"  (1.575 m)   Wt (!) 146.7 kg (323 lb 6.6 oz)   LMP 02/17/2018   SpO2 95%   BMI 59.15 kg/m  Constitutional: She appears well-developed. Obese  HENT: Normocephalic and atraumatic.  Eyes: EOM are normal. No discharge.  Cardiovascular: Normal rate, regular rhythm and no JVD. Respiratory: Breath sounds normal. Clear. GI: Bowel sounds are normal. She exhibits no distension.  Musculoskeletal: Left stump TTP  Neurological: She is alert.  Motor: 5/5 bilateral deltoids, biceps, triceps, hand grip 4+/5 Right hip flexion knee extension ankle dorsiflexion 4/5 Left hip flexion Skin: +VAC left stump  Psychiatric: She has a normal mood and affect. Her behavior is normal.   Assessment/Plan: 1. Functional deficits secondary to left BKA which require 3+ hours per day of interdisciplinary therapy in a  comprehensive inpatient rehab setting. Physiatrist is providing close team supervision and 24 hour management of active medical problems listed below. Physiatrist and rehab team continue to assess barriers to discharge/monitor patient progress toward functional and medical goals.  Function:  Bathing Bathing position      Bathing parts      Bathing assist        Upper Body Dressing/Undressing Upper body dressing                    Upper body assist        Lower Body Dressing/Undressing Lower body dressing                                  Lower body assist        Toileting Toileting     Toileting steps completed by helper: Performs perineal hygiene    Toileting assist Assist level: Touching or steadying assistance (Pt.75%)   Transfers Chair/bed transfer             Locomotion Ambulation           Wheelchair          Cognition Comprehension Comprehension assist level: Follows complex conversation/direction with no assist  Expression Expression assist level: Expresses complex ideas: With no assist  Technical sales engineer Social  Interaction assist level: Interacts appropriately with others - No medications needed.  Problem Solving Problem solving assist level: Solves basic problems with no assist  Memory Memory assist level: Complete Independence: No helper    Medical Problem List and Plan: 1.  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    Begin CIR 2.  DVT Prophylaxis/Anticoagulation: SCD right lower extremity. Will discuss chemical prophylaxis. 3. Pain Management/chronic pain syndrome: OxyContin 10 mg every 12 hours, Neurontin 1200 mg 3 times daily, oxycodone and Robaxin as needed   Will attempt to wean 4. Mood/bipolar disorder: Provide emotional support 5. Neuropsych: This patient is capable of making decisions on her own behalf. 6. Skin/Wound Care:  Routine skin checks 7. Fluids/Electrolytes/Nutrition: Routine I&O's    Labs pending 8.  Diabetes mellitus with peripheral neuropathy.  Latest hemoglobin A1c 13.8.     NovoLog 6 units 3 times daily   Lantus insulin 42 units every morning   Victoza 1.8 mg every morning.     Check blood sugars before meals and at bedtime.  Diabetic teaching   Monitor with increased mobility 9.  COPD.  CPAP. 10.  Morbid obesity.  Dietary follow-up 11.  Hypertension.  Coreg 3.125 mg twice daily.  Check blood sugars before meals and at bedtime   Monitor with increased mobility 12.  Hyperlipidemia.  Lipitor 13.  Constipation.  Laxative assistance 14. Leukocytosis   WBCs 11.6 on 4/2   Cont to monitor 15. ABLA   Hb 8.6 on 4/2   Cont to monitor  LOS (Days) 1 A FACE TO FACE EVALUATION WAS PERFORMED  Ankit Lorie Phenix 03/07/2018 7:57 AM

## 2018-03-07 NOTE — Evaluation (Signed)
Physical Therapy Assessment and Plan  Patient Details  Name: [name] MRN: [id number] Date of Birth: Jan 08, 1976  PT Diagnosis: Abnormality of gait, Difficulty walking, Muscle weakness and Pain in L residual limb Rehab Potential: Good ELOS: 8-10 days   Today's Date: 03/07/2018 PT Individual Time: 0900-1000 PT Individual Time Calculation (min): 60 min    Problem List:  Patient Active Problem List   Diagnosis Date Noted  . Acute blood loss anemia   . Leukocytosis   . Allergic rhinitis 03/06/2018  . Amputation of left lower extremity below knee ([place]) 03/06/2018  . Unilateral complete BKA, left, sequela ([place])   . Chronic pain syndrome   . Neuropathic pain   . Poorly controlled type 2 diabetes mellitus with peripheral neuropathy ([place])   . Super-super obese ([place])   . Benign essential HTN   . Drug-induced constipation   . Acquired absence of leg below knee ([place]) 03/03/2018  . Dehiscence of amputation stump ([place])   . Amputated toe of left foot ([place]) 02/10/2018  . Acute osteomyelitis of ankle or foot, left ([place]) 01/31/2018  . Acute on chronic blood loss anemia   . AKI (acute kidney injury) ([place])   . Type 2 diabetes mellitus with foot ulcer, with long-term current use of insulin ([place])   . Chronic obstructive pulmonary disease ([place])   . Subacute osteomyelitis, left ankle and foot ([place])   . Diabetic foot ulcer ([place]) 10/29/2017  . Ulcer of skin ([place]) 10/14/2017  . Decreased pedal pulses 06/10/2017  . Adjustment disorder with mixed anxiety and depressed mood 11/04/2016  . History of medication noncompliance 11/04/2016  . Unilateral primary osteoarthritis, right knee 10/22/2016  . Diabetic neuropathy ([place]) 07/14/2016  . Syncope 02/25/2016  . De Quervain's tenosynovitis, bilateral 11/01/2015  . Vitamin D deficiency 09/05/2015  . Recurrent candidiasis of vagina 09/05/2015  . Varicose veins of leg with complications 12/14/3233  . Neuropathy, intercostal nerve 12/17/2014  . Rectal  itching 11/17/2014  . Restless leg syndrome 10/17/2014  . Chronic sinusitis 07/18/2014  . Headache 07/15/2014  . Vision, loss, sudden 07/12/2014  . Urge incontinence 10/15/2013  . Encounter for chronic pain management 06/30/2013  . Low back pain 01/31/2013  . HLD (hyperlipidemia) 11/19/2012  . Chest pain 06/27/2012  . Right carpal tunnel syndrome 09/01/2011  . Bilateral knee pain 09/01/2011  . Diabetes mellitus ([place]) 05/22/2008  . Morbid obesity ([place]) 05/22/2008  . 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 Depression 05/22/2008  . Obstructive sleep apnea 05/22/2008  . Hypertension 05/22/2008  . Asthma 05/22/2008  . GERD 05/22/2008    Past Medical History:  Past Medical History:  Diagnosis Date  . Acute osteomyelitis of ankle or foot, left ([place]) 01/31/2018  . Alveolar hypoventilation   . Anemia    not on iron pill  . Arthritis   . Asthma   . Bipolar 2 disorder ([place])   . Carpal tunnel syndrome on right    recurrent  . Cellulitis 08/2010-08/2011  . Cellulitis of left foot 01/27/2018  . Chronic pain   . COPD (chronic obstructive pulmonary disease) ([name])    Symbicort daily and Proventil as needed  . Costochondritis   . Depression   . Diabetes mellitus 2000   Type 2, Uncontrolled.Takes Lantus daily.Fasting blood sugar runs 150  . Dizziness    occasionally  . Drug-seeking behavior   . GERD (gastroesophageal reflux disease)    takes Pantoprazole and Zantac daily  . Headache    migraine-last one about a yr ago.Topamax daily  .  HLD (hyperlipidemia)    takes Atorvastatin daily  . Hypertension    takes Lisinopril and Coreg daily  . Morbid obesity ([place])   . Muscle spasm    takes Flexeril as needed  . Nocturia   . Obstructive sleep apnea   . Peripheral neuropathy    takes Gabapentin daily  . Pneumonia    "walking" several yrs ago and as a baby  . Rectal fissure   . Restless leg   . Urinary frequency   . Varicose veins    Right medial thigh and Left leg    Past  Surgical History:  Past Surgical History:  Procedure Laterality Date  . AMPUTATION Left 02/01/2018   Procedure: LEFT FOURTH AND 5TH TOE RAY AMPUTATION;  Surgeon: [name], MD;  Location: [facility];  Service: Orthopedics;  Laterality: Left;  . AMPUTATION Left 03/03/2018   Procedure: LEFT BELOW KNEE AMPUTATION;  Surgeon: [name], MD;  Location: [facility];  Service: Orthopedics;  Laterality: Left;  . CARPAL TUNNEL RELEASE Bilateral   . CESAREAN SECTION    . KNEE ARTHROSCOPY Right 07/17/2010  . LEFT HEART CATHETERIZATION WITH CORONARY ANGIOGRAM N/A 07/27/2012   Procedure: LEFT HEART CATHETERIZATION WITH CORONARY ANGIOGRAM;  Surgeon: Sherren Mocha, MD;  Location: [facility] C[facility] LAB;  Service: Cardiovascular;  Laterality: N/A;  . left knee surgery     screws she thinks  . MASS EXCISION N/A 06/29/2013   Procedure:  WIDE LOCAL EXCISION OF POSTERIOR NECK ABSCESS;  Surgeon: Ralene Ok, MD;  Location: [facility];  Service: General;  Laterality: N/A;    Assessment & Plan Clinical Impression: [name] an 42 y.o.right-handed femalepatient with history ofmorbid obesity,diabetes uncontrolled with polyneuropathy on insulin, bipolar disorder, COPD, with recent fourth and fifth ray amputations of the left foot performed on 02/01/2018. Per chart review patient lives with spouse. One level home with 2 steps to entry. Plan is for a ramp. Household ambulator with a rolling walker. Husband was assisting with some ADLs and working during the day. Presented to the office of Dr. Sharol Given on 02/27/2018 with dehiscence of her wound. She was diagnosed with osteomyelitis, surgery was scheduled for 3/29/2019and underwent left transtibial amputation by Dr. Sharol Given.Wound VAC applied.Postoperatively patient had pain graded as moderate. She was placed on home CPAP unit. She has a history of chronic pain as well as "drug seeking behavior" on her problem listand was monitored closely.  Patient transferred to CIR on  03/06/2018 .   Patient currently requires min with mobility secondary to muscle weakness and muscle joint tightness, decreased cardiorespiratoy endurance and decreased sitting balance, decreased standing balance, decreased postural control and decreased balance strategies.  Prior to hospitalization, patient was modified independent  with mobility and lived with Spouse, Daughter in a House home.  Home access is 2Stairs to enter.  Patient will benefit from skilled PT intervention to maximize safe functional mobility, minimize fall risk and decrease caregiver burden for planned discharge home with intermittent assist.  Anticipate patient will benefit from follow up [facility] at discharge.  PT - End of Session Activity Tolerance: Tolerates 10 - 20 min activity with multiple rests Endurance Deficit: Yes Endurance Deficit Description: fatigues quickly with mobility activities PT Assessment Rehab Potential (ACUTE/IP ONLY): Good PT Barriers to Discharge: Inaccessible home environment;Decreased caregiver support PT Barriers to Discharge Comments: intermittent assist at home, home not accessible to w/c PT Patient demonstrates impairments in the following area(s): Balance;Skin Integrity;Edema;Endurance;Motor;Pain;Perception;Safety;Sensory PT Transfers Functional Problem(s): Bed Mobility;Bed to Chair;Car PT Locomotion Functional Problem(s):  Ambulation;Wheelchair Mobility PT Plan PT Intensity: Minimum of 1-2 x/day ,45 to 90 minutes PT Frequency: 5 out of 7 days PT Duration Estimated Length of Stay: 8-10 days PT Treatment/Interventions: Ambulation/gait training;Discharge planning;Functional mobility training;Psychosocial support;Therapeutic Activities;Wheelchair propulsion/positioning;Therapeutic Exercise;Skin care/wound management;Neuromuscular re-education;Disease management/prevention;Balance/vestibular training;DME/adaptive equipment instruction;Pain management;UE/LE Strength taining/ROM;Splinting/orthotics;UE/LE  Coordination activities;Patient/family education;Community reintegration PT Transfers Anticipated Outcome(s): modI PT Locomotion Anticipated Outcome(s): S gait with RW, modI w/c propulsion PT Recommendation Recommendations for Other Services: Therapeutic Recreation consult;Neuropsych consult Therapeutic Recreation Interventions: Pet therapy;Kitchen group;Outing/community reintergration;Stress management Follow Up Recommendations: Home health PT Patient destination: Home Equipment Recommended: None recommended by PT Equipment Details: has RW, w/c with ELR  Skilled Therapeutic Intervention Pt received seated on EOB, c/o pain as below and agreeable to treatment. Assessed mobility as described below with minA overall using RW; pt primarily limited by pain in L residual limb, decreased UE/LE strength and aerobic endurance. Educated pt on LLE positioning to prevent contracture, HEP for maintaining strength in BLE until prosthetic training initiated. Discussed rehab process, goals, estimated length of stay; pt agreeable to all the above. Remained seated in w/c at end of session, all needs in reach.   PT Evaluation Precautions/Restrictions Precautions Precautions: Fall Restrictions Weight Bearing Restrictions: Yes LLE Weight Bearing: Non weight bearing General Chart Reviewed: Yes Response to Previous Treatment: Not applicable Family/Caregiver Present: No  Pain Pain Assessment Pain Scale: 0-10 Pain Score: 6  Pain Type: Surgical pain Pain Location: Leg Pain Orientation: Left Pain Descriptors / Indicators: Aching Pain Frequency: Intermittent Pain Onset: On-going Patients Stated Pain Goal: 2 Pain Intervention(s): Medication (See eMAR) Multiple Pain Sites: No Home Living/Prior Functioning Home Living Available Help at Discharge: Family;Available PRN/intermittently Type of Home: House Home Access: Stairs to enter [employer] of Steps: 2 Entrance Stairs-Rails: None Home  Layout: One level  Lives With: Spouse;Daughter Prior Function Comments: Patients husband has been helping with ADLs, working during day, CNA intermittent. Working on ramp to enter home. Vision/Perception  Perception Perception: Within Functional Limits Praxis Praxis: Intact  Cognition Overall Cognitive Status: Within Functional Limits for tasks assessed Arousal/Alertness: Awake/alert Attention: Sustained;Focused Focused Attention: Appears intact Sustained Attention: Appears intact Memory: Appears intact Awareness: Appears intact Problem Solving: Appears intact Safety/Judgment: Appears intact Sensation Sensation Light Touch: Appears Intact Stereognosis: Appears Intact Hot/Cold: Appears Intact Proprioception: Appears Intact Additional Comments: Sensation intact in BUEs Coordination Gross Motor Movements are Fluid and Coordinated: Yes Fine Motor Movements are Fluid and Coordinated: Yes Heel Shin Test: NT d/t amputation Motor  Motor Motor: Within Functional Limits  Mobility Bed Mobility Bed Mobility: Sit to Supine;Supine to Sit Supine to Sit: 5: Supervision;With rails Supine to Sit Details: Verbal cues for technique;Verbal cues for precautions/safety Sit to Supine: 5: Supervision;With rail Sit to Supine - Details: Verbal cues for technique;Verbal cues for precautions/safety Transfers Transfers: Yes Sit to Stand: 4: Min assist;With armrests;With upper extremity assist Sit to Stand Details: Verbal cues for technique;Verbal cues for precautions/safety;Verbal cues for safe use of DME/AE Stand Pivot Transfers: 4: Min assist Stand Pivot Transfer Details: Verbal cues for technique;Verbal cues for precautions/safety Locomotion  Ambulation Ambulation: Yes Ambulation/Gait Assistance: 4: Min guard;1: +2 Total assist;4: Min assist(w/c follow) Ambulation Distance (Feet): 5 Feet Assistive device: Rolling walker Ambulation/Gait Assistance Details: Verbal cues for technique;Verbal  cues for precautions/safety Ambulation/Gait Assistance Details: cues for UE technique Gait Gait: Yes Gait Pattern: Impaired Gait Pattern: Poor foot clearance - right;Antalgic Gait velocity: significantly decreased Stairs / Additional Locomotion Stairs: No Architect: Yes Wheelchair Assistance: 4: Scientist, research (life sciences)  Propulsion: Both upper extremities Wheelchair Parts Management: Needs assistance Distance: 16'  Trunk/Postural Assessment  Cervical Assessment Cervical Assessment: Within Functional Limits Thoracic Assessment Thoracic Assessment: Within Functional Limits Lumbar Assessment Lumbar Assessment: Within Functional Limits Postural Control Postural Control: Within Functional Limits  Balance Balance Balance Assessed: Yes Static Sitting Balance Static Sitting - Balance Support: Feet supported Static Sitting - Level of Assistance: 6: Modified independent (Device/Increase time) Dynamic Sitting Balance Dynamic Sitting - Balance Support: During functional activity Dynamic Sitting - Level of Assistance: 5: Stand by assistance Static Standing Balance Static Standing - Balance Support: During functional activity Static Standing - Level of Assistance: 4: Min assist Dynamic Standing Balance Dynamic Standing - Balance Support: During functional activity;Bilateral upper extremity supported Dynamic Standing - Level of Assistance: 4: Min assist Extremity Assessment  RUE Assessment RUE Assessment: Within Functional Limits LUE Assessment LUE Assessment: Within Functional Limits RLE Assessment RLE Assessment: Within Functional Limits(grossly 4+/5 throughout) LLE Assessment LLE Assessment: Exceptions to WFL(BKA; hip/knee flexion grossly 4-/5 throughout, not formally assessed d/t pain)   See Function Navigator for Current Functional Status.   Refer to Care Plan for Long Term Goals  Recommendations for other services: Neuropsych and Therapeutic  Recreation  Pet therapy, Kitchen group and Outing/community reintegration  Discharge Criteria: Patient will be discharged from PT if patient refuses treatment 3 consecutive times without medical reason, if treatment goals not met, if there is a change in medical status, if patient makes no progress towards goals or if patient is discharged from hospital.  The above assessment, treatment plan, treatment alternatives and goals were discussed and mutually agreed upon: by patient  Luberta Mutter 03/07/2018, 1:01 PM

## 2018-03-07 NOTE — Significant Event (Addendum)
Pt. Refused coreg 3.125 pill. Pam Love PAC has been notified.Also she refused the Nitroglycerin patch.Pt. Was asked to bring her [place] medicine from home,she said she does not want to bring it to the hospital.

## 2018-03-08 ENCOUNTER — Inpatient Hospital Stay (HOSPITAL_COMMUNITY): Payer: Self-pay | Admitting: Physical Therapy

## 2018-03-08 ENCOUNTER — Inpatient Hospital Stay (HOSPITAL_COMMUNITY): Payer: Self-pay | Admitting: Occupational Therapy

## 2018-03-08 ENCOUNTER — Inpatient Hospital Stay (HOSPITAL_COMMUNITY): Payer: Self-pay

## 2018-03-08 ENCOUNTER — Encounter (HOSPITAL_COMMUNITY): Payer: Self-pay | Admitting: Psychology

## 2018-03-08 DIAGNOSIS — G8918 Other acute postprocedural pain: Secondary | ICD-10-CM

## 2018-03-08 LAB — GLUCOSE, CAPILLARY
GLUCOSE-CAPILLARY: 194 mg/dL — AB (ref 65–99)
GLUCOSE-CAPILLARY: 176 mg/dL — AB (ref 65–99)
GLUCOSE-CAPILLARY: 192 mg/dL — AB (ref 65–99)
Glucose-Capillary: 255 mg/dL — ABNORMAL HIGH (ref 65–99)

## 2018-03-08 MED ORDER — OXYCODONE HCL 5 MG PO TABS: 5.0000 mg | ORAL_TABLET | ORAL | Status: DC | PRN

## 2018-03-08 MED ORDER — SENNOSIDES-DOCUSATE SODIUM 8.6-50 MG PO TABS
2.0000 | ORAL_TABLET | Freq: Every day | ORAL | Status: DC
  Administered 2018-03-08 – 2018-03-15 (×7): 2 via ORAL
  Filled 2018-03-08 (×8): qty 2

## 2018-03-08 MED ORDER — INSULIN GLARGINE 100 UNIT/ML ~~LOC~~ SOLN
50.0000 [IU] | Freq: Every morning | SUBCUTANEOUS | Status: DC
  Administered 2018-03-08 – 2018-03-14 (×7): 50 [IU] via SUBCUTANEOUS
  Filled 2018-03-08 (×9): qty 0.5

## 2018-03-08 MED ORDER — POLYETHYLENE GLYCOL 3350 17 G PO PACK
17.0000 g | PACK | Freq: Two times a day (BID) | ORAL | Status: DC
  Administered 2018-03-08 – 2018-03-14 (×11): 17 g via ORAL
  Filled 2018-03-08 (×16): qty 1

## 2018-03-08 MED ORDER — OXYCODONE HCL 5 MG PO TABS
5.0000 mg | ORAL_TABLET | Freq: Four times a day (QID) | ORAL | Status: DC | PRN
  Administered 2018-03-08 – 2018-03-13 (×16): 5 mg via ORAL
  Filled 2018-03-08 (×17): qty 1

## 2018-03-08 NOTE — Progress Notes (Signed)
Occupational Therapy Session Note  Patient Details  Name: [name] MRN: [id number] Date of Birth: 07-26-1976  Today's Date: 03/08/2018 OT Individual Time: 1001-1103 OT Individual Time Calculation (min): 62 min    Short Term Goals: Week 1:  OT Short Term Goal 1 (Week 1): STGs equal to LTGs based on ELOS  Skilled Therapeutic Interventions/Progress Updates:    Pt completed bathing and dressing sit to stand from the EOB.  Pain meds brought by nursing at start of session.  Pt completed all UB selfcare with supervision.  Lb bathing with min assist for washing peri area.  Therapist provided LH sponge for washing the right foot and for attempted washing of peri area, but she was not successful.  Reacher and sockaide utilized for donning gripper sock, underpants, and pants over her RLE.  Min assist for dynamic standing balance to pull clothing over hips.  Finished session with grooming tasks in sitting on the EOB.  Pt transferred back to the bed at the end of the session secondary to wanting to position her LLE better for pain.  Call button and phone in reach.    Therapy Documentation Precautions:  Precautions Precautions: Fall Restrictions Weight Bearing Restrictions: Yes LLE Weight Bearing: Non weight bearing   Pain: Pain Assessment Pain Scale: 0-10 Pain Score: 8  Pain Type: Surgical pain Pain Location: Leg Pain Orientation: Left Pain Descriptors / Indicators: Grimacing;Discomfort Pain Onset: On-going Pain Intervention(s): RN made aware;Emotional support;Repositioned ADL: See Function Navigator for Current Functional Status.   Therapy/Group: Individual Therapy  Agustus Mane OTR/L 03/08/2018, 12:29 PM

## 2018-03-08 NOTE — Progress Notes (Signed)
Physical Therapy Session Note  Patient Details  Name: [name] MRN: [id number] Date of Birth: 12/12/1975  Today's Date: 03/08/2018 PT Concurrent Time: 1300-1400 PT Concurrent Time Calculation (min): 60 min  Short Term Goals: Week 1:  PT Short Term Goal 1 (Week 1): =LTG due to estimated LOS  Skilled Therapeutic Interventions/Progress Updates: Pt received seated on BSC having bowel movement, denies pain and agreeable to treatment. Required several minutes to complete bowel movement. Pt able to perform posterior hygiene but requests therapist assist with wiping as well to ensure cleanliness, "like my daughter does at home". W/c propulsion to/from gym with BUE and S for strengthening and endurance. Sit <>stand x10 reps, x8 reps with min guard using RW. Gait x5' with minA/min guard, cues for technique to reduce impact of R foot on floor. Stand pivot transfer min guard w/c <>mat table using RW. Supine and sidelying straight leg raise 2x15 reps. Returned to w/c min guard as above, propelled to room as above. Remained seated in w/c, all needs in reach at completion of session.      Therapy Documentation Precautions:  Precautions Precautions: Fall Restrictions Weight Bearing Restrictions: Yes LLE Weight Bearing: Non weight bearing   See Function Navigator for Current Functional Status.   Therapy/Group: Concurrent  Luberta Mutter 03/08/2018, 2:20 PM

## 2018-03-08 NOTE — Progress Notes (Addendum)
Social Work Assessment and Plan  Patient Details  Name: [name] MRN: [id number] Date of Birth: 04/25/1976  Today's Date: 03/07/2018  Problem List:  Patient Active Problem List   Diagnosis Date Noted  . Post-op pain   . Acute blood loss anemia   . Leukocytosis   . Allergic rhinitis 03/06/2018  . 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 Unilateral complete BKA, left, sequela ([name])   . Chronic pain syndrome   . Neuropathic pain   . Poorly controlled type 2 diabetes mellitus with peripheral neuropathy ([name])   . Super-super obese ([name])   . Benign essential HTN   . Drug-induced constipation   . Acquired absence of leg below knee ([name]) 03/03/2018  . Dehiscence of amputation stump ([name])   . Amputated toe of left foot ([name]) 02/10/2018  . Acute osteomyelitis of ankle or foot, left ([name]) 01/31/2018  . Acute on chronic blood loss anemia   . AKI (acute kidney injury) ([name])   . Type 2 diabetes mellitus with foot ulcer, with long-term current use of insulin ([name])   . Chronic obstructive pulmonary disease ([name])   . Subacute osteomyelitis, left ankle and foot ([name])   . Diabetic foot ulcer ([name]) 10/29/2017  . Ulcer of skin ([name]) 10/14/2017  . Decreased pedal pulses 06/10/2017  . Adjustment disorder with mixed anxiety and depressed mood 11/04/2016  . History of medication noncompliance 11/04/2016  . Unilateral primary osteoarthritis, right knee 10/22/2016  . Diabetic neuropathy ([name]) 07/14/2016  . Syncope 02/25/2016  . De Quervain's tenosynovitis, bilateral 11/01/2015  . Vitamin D deficiency 09/05/2015  . Recurrent candidiasis of vagina 09/05/2015  . Varicose veins of leg with complications 06/11/2015  . Neuropathy, intercostal nerve 12/17/2014  . Rectal itching 11/17/2014  . Restless leg syndrome 10/17/2014  . Chronic sinusitis 07/18/2014  . Headache 07/15/2014  . Vision, loss, sudden 07/12/2014  . Urge incontinence 10/15/2013  . Encounter for chronic pain  management 06/30/2013  . Low back pain 01/31/2013  . HLD (hyperlipidemia) 11/19/2012  . Chest pain 06/27/2012  . Right carpal tunnel syndrome 09/01/2011  . Bilateral knee pain 09/01/2011  . Diabetes mellitus ([name]) 05/22/2008  . Morbid obesity ([name]) 05/22/2008  . 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 Depression 05/22/2008  . Obstructive sleep apnea 05/22/2008  . Hypertension 05/22/2008  . Asthma 05/22/2008  . GERD 05/22/2008   Past Medical History:  Past Medical History:  Diagnosis Date  . Acute osteomyelitis of ankle or foot, left ([name]) 01/31/2018  . Alveolar hypoventilation   . Anemia    not on iron pill  . Arthritis   . Asthma   . Bipolar 2 disorder ([name])   . Carpal tunnel syndrome on right    recurrent  . Cellulitis 08/2010-08/2011  . Cellulitis of left foot 01/27/2018  . Chronic pain   . COPD (chronic obstructive pulmonary disease) ([name])    Symbicort daily and Proventil as needed  . Costochondritis   . Depression   . Diabetes mellitus 2000   Type 2, Uncontrolled.Takes Lantus daily.Fasting blood sugar runs 150  . Dizziness    occasionally  . Drug-seeking behavior   . GERD (gastroesophageal reflux disease)    takes Pantoprazole and Zantac daily  . Headache    migraine-last one about a yr ago.Topamax daily  . HLD (hyperlipidemia)    takes Atorvastatin daily  . Hypertension    takes Lisinopril and Coreg daily  . Morbid obesity ([name])   . Muscle spasm      takes Flexeril as needed  . Nocturia   . Obstructive sleep apnea   . Peripheral neuropathy    takes Gabapentin daily  . Pneumonia    "walking" several yrs ago and as a baby  . Rectal fissure   . Restless leg   . Urinary frequency   . Varicose veins    Right medial thigh and Left leg    Past Surgical History:  Past Surgical History:  Procedure Laterality Date  . AMPUTATION Left 02/01/2018   Procedure: LEFT FOURTH AND 5TH TOE RAY AMPUTATION;  Surgeon: [name], MD;  Location: [place] [place];  Service:  Orthopedics;  Laterality: Left;  . AMPUTATION Left 03/03/2018   Procedure: LEFT BELOW KNEE AMPUTATION;  Surgeon: [name], MD;  Location: [facility];  Service: Orthopedics;  Laterality: Left;  . CARPAL TUNNEL RELEASE Bilateral   . CESAREAN SECTION    . KNEE ARTHROSCOPY Right 07/17/2010  . LEFT HEART CATHETERIZATION WITH CORONARY ANGIOGRAM N/A 07/27/2012   Procedure: LEFT HEART CATHETERIZATION WITH CORONARY ANGIOGRAM;  Surgeon: Sherren Mocha, MD;  Location: [facility] CATH LAB;  Service: Cardiovascular;  Laterality: N/A;  . left knee surgery     screws she thinks  . MASS EXCISION N/A 06/29/2013   Procedure:  WIDE LOCAL EXCISION OF POSTERIOR NECK ABSCESS;  Surgeon: Ralene Ok, MD;  Location: [place];  Service: General;  Laterality: N/A;   Social History:  reports that she quit smoking about 24 years ago. Her smoking use included cigarettes. She has a 0.09 pack-year smoking history. She has never used smokeless tobacco. She reports that she has current or past drug history. She reports that she does not drink alcohol.  Family / Support Systems Marital Status: Married How Long?: 4 years, but has been with husband for 12 years Patient Roles: Spouse, Parent, Other (Comment)(dtr) Spouse/Significant Other: Sherle Poe - husband - [phone] Children: [name] - 46 y/o dtr - 6th grade Other Supports: Venie Montesinos - mother - [phone] Anticipated Caregiver: spouse and CNA Ability/Limitations of Caregiver: spouse works days Caregiver Availability: Intermittent Family Dynamics: supportive family  Social History Preferred language: English Religion: None Read: Yes Write: Yes Employment Status: Disabled Public relations account executive Issues: none reported Guardian/Conservator: N/A - MD has determined that pt is capable of making her own decisions.   Abuse/Neglect Abuse/Neglect Assessment Can Be Completed: Yes Physical Abuse: Denies Verbal Abuse: Denies Sexual Abuse: Denies Exploitation of  patient/patient's resources: Denies Self-Neglect: Denies  Emotional Status Pt's affect, behavior and adjustment status: Pt is motivated to get better and get a prosthesis.  She reports feeling okay emotionally, but is asking for a amputee peer support visit. Recent Psychosocial Issues: Pt had two toes amputated and that failed, resulting in a BKA.  She is ready to be feeling better. Psychiatric History: Bipolar disorder; depression Substance Abuse History: none reported, but chart mentions concern for drug seeking behavior with doctors in the past.  Rehab team monitoring closely.  Patient / Family Perceptions, Expectations & Goals Pt/Family understanding of illness & functional limitations: Pt reports a good understanding of her condition and limitations.  Looking forward to getting back to her life. Premorbid pt/family roles/activities: Pt was taking care of household and caring for her 40 y/o dtr.  She likes to watch TV and read. Anticipated changes in roles/activities/participation: Pt wants to resume activities and roles as soon as she is able. Pt/family expectations/goals: Pt wants to walk again and to get back to doing for herself and her family.  Community Resources Community Agencies: Other (Comment)([facility]) Premorbid Home Care/DME Agencies: Other (Comment)([facility] for DME - wheelchair, bedside commode, walker, crutches; [facility] for PCS aide 2 hours/day) Transportation available at discharge: family Resource referrals recommended: (S) Neuropsychology, Support group (specify)(Amputee Support [facility] [place])  Discharge Planning Living Arrangements: Spouse/significant other, Children Support Systems: Spouse/significant other, Children, Parent, Other relatives Type of Residence: Private residence Insurance Resources: Medicaid (specify county)([place]) Financial Resources: Family Support Financial Screen Referred: No Money Management: Patient, Spouse Does the  patient have any problems obtaining your medications?: No Home Management: Pt was doing this.  Husband is trying to do what he can, but pt reports things are undone when she cannot do them. Patient/Family Preliminary Plans: Pt plans to return to her home with intermittent assist from her husband, aide, and mother.  Pt reports ramp is being built. Social Work Anticipated Follow Up Needs: HH/OP, Support Group Expected length of stay: 8 to 10 days  Clinical Impression CSW met with pt to introduce self and role of CSW, as well as to complete assessment.  Pt is glad to be on CIR and worried she would have to go straight home or to a SNF.  She plans to work hard and to get a prosthesis so she can keep up with all her dtr will be doing in the coming years.  She reports having a lot to look forward to and is motivated to regain her independence.  Pt will have intermittent support at home.  CSW to connect her to amputee peer support person and give her group information.  Pt is also wanting support for her dtr.  CSW to investigate options for her.  CSW will continue to follow and assist as needed.  ,  Capps 03/08/2018, 1:36 PM   

## 2018-03-08 NOTE — Progress Notes (Signed)
Physical Therapy Note  Patient Details  Name: [name] MRN: [id number] Date of Birth: [birth year] Today's Date: 03/08/2018  0800-0935, 95 min individual tx Pain: 9/10 residual limb; medicated during session  Bed mobility with supervision to sit EOB.  Stand pivot bed> BSC using RW iwht min guard assist.  Pt voided on BSC.  Dependent for wiping due to body habitus.   Pt donned panties and pants with mod assist, sittting BSC and standing with RW.  RN disconnected wound bac to trhead it outside of panties.  BSC > w/c with RW asa above.  Pt sat in w/c to eat breakfast.  W/c propulsion in congested room with supervision to avoid obstacles.  Pt c/o pain from wound vac cycling on/off.  Erline Levine, RN adjusted wound vac.  W/c propulsion on level tile x 150' in bouts of 21' with supervision, with rests for fatigue.  Pt instructed pt in efficiency of propulsion, turns L./R and circles, with improvement during session.  PT replaced R foot rest for one that accomodates pt's leg length better, and added towel to L amputee pad to ensure knee ext  Seated RLE strengthening: R ankle pumps 2 x 15; R heel raises 1 x 15, 1 x 15 R long arc quad knee extension with 2# on ankle.  Pt left resting in w/c with all needs within reach.  See function navigator for current status.  [name] 03/08/2018, 7:44 AM

## 2018-03-08 NOTE — Progress Notes (Signed)
Inpatient Rehabilitation Center Individual Statement of Services  Patient Name:  [name]  Date:  03/08/2018  Welcome to the [facility].  Our goal is to provide you with an individualized program based on your diagnosis and situation, designed to meet your specific needs.  With this comprehensive rehabilitation program, you will be expected to participate in at least 3 hours of rehabilitation therapies Monday-Friday, with modified therapy programming on the weekends.  Your rehabilitation program will include the following services:  Physical Therapy (PT), Occupational Therapy (OT), 24 hour per day rehabilitation nursing, Neuropsychology, Case Management (Social Worker), Rehabilitation Medicine, Nutrition Services and Pharmacy Services  Weekly team conferences will be held on Wednesdays to discuss your progress.  Your Social Worker will talk with you frequently to get your input and to update you on team discussions.  Team conferences with you and your family in attendance may also be held.  Expected length of stay:  8 to 10 days  Overall anticipated outcome:  Modified independent and supervision needed for bathing and ambulating  Depending on your progress and recovery, your program may change. Your Social Worker will coordinate services and will keep you informed of any changes. Your Social Worker's name and contact numbers are listed  below.  The following services may also be recommended but are not provided by the [facility] will be made to provide these services after discharge if needed.  Arrangements include referral to agencies that provide these services.  Your insurance has been verified to be:  Medicaid Your primary doctor is:  Dr. [name]  Pertinent information will be shared with your doctor and your insurance  company.  Social Worker:  Alfonse Alpers, LCSW  [phone] or ([phone]  Information discussed with and copy given to patient by: Trey Sailors, 03/08/2018, 12:06 PM

## 2018-03-08 NOTE — Progress Notes (Addendum)
[facility] PHYSICAL MEDICINE & REHABILITATION     PROGRESS NOTE  Subjective/Complaints:  Patient seen lying bed this morning. She states she slept well overnight. She states she had a "interesting first day of therapies and "she still has not had a bowel movement. Discussed decreasing pain meds.  ROS: +Constipation. Denies CP, SOB, N/V/D.  Objective: Vital Signs: Blood pressure 131/74, pulse 100, temperature 98.4 F (36.9 C), temperature source Oral, resp. rate 20, height 5\' 2"  (1.575 m), weight (!) 146.7 kg (323 lb 6.6 oz), last menstrual period 02/17/2018, SpO2 95 %. No results found. Recent Labs    03/07/18 0646  WBC 11.6*  HGB 8.6*  HCT 27.0*  PLT 311   Recent Labs    03/07/18 0646  NA 132*  K 3.8  CL 96*  GLUCOSE 258*  BUN 20  CREATININE 0.85  CALCIUM 8.3*   CBG (last 3)  Recent Labs    03/07/18 1643 03/07/18 2125 03/08/18 0639  GLUCAP 169* 233* 255*    Wt Readings from Last 3 Encounters:  03/06/18 (!) 146.7 kg (323 lb 6.6 oz)  03/03/18 (!) 146.1 kg (322 lb)  02/23/18 (!) 146.1 kg (322 lb)    Physical Exam:  BP 131/74 (BP Location: Right Arm)   Pulse 100   Temp 98.4 F (36.9 C) (Oral)   Resp 20   Ht 5\' 2"  (1.575 m)   Wt (!) 146.7 kg (323 lb 6.6 oz)   LMP 02/17/2018   SpO2 95%   BMI 59.15 kg/m  Constitutional: She appears well-developed. Obese  HENT: Normocephalic and atraumatic.  Eyes: EOM are normal. No discharge.  Cardiovascular: RRR and no JVD. Respiratory: Breath sounds normal. Clear. GI: Bowel sounds are normal. She exhibits no distension.  Musculoskeletal: Left stump TTP  Neurological: She is alert.  Motor:  4/5 Left hip flexion (stable) Skin: +VAC left stump  Psychiatric: She has a normal mood and affect. Her behavior is normal.   Assessment/Plan: 1. Functional deficits secondary to left BKA which require 3+ hours per day of interdisciplinary therapy in a comprehensive inpatient rehab setting. Physiatrist is providing close team  supervision and 24 hour management of active medical problems listed below. Physiatrist and rehab team continue to assess barriers to discharge/monitor patient progress toward functional and medical goals.  Function:  Bathing Bathing position   Position: Sitting EOB  Bathing parts Body parts bathed by patient: Right arm, Left arm, Chest, Abdomen, Front perineal area, Right upper leg, Left upper leg Body parts bathed by helper: Right lower leg, Buttocks, Back  Bathing assist        Upper Body Dressing/Undressing Upper body dressing   What is the patient wearing?: Pull over shirt/dress, Bra Bra - Perfomed by patient: Thread/unthread right bra strap, Thread/unthread left bra strap, Hook/unhook bra (pull down sports bra)   Pull over shirt/dress - Perfomed by patient: Thread/unthread right sleeve, Thread/unthread left sleeve, Put head through opening, Pull shirt over trunk          Upper body assist Assist Level: Set up      Lower Body Dressing/Undressing Lower body dressing   What is the patient wearing?: Pants, Underwear, Non-skid slipper socks Underwear - Performed by patient: Thread/unthread right underwear leg, Thread/unthread left underwear leg Underwear - Performed by helper: Pull underwear up/down Pants- Performed by patient: Thread/unthread right pants leg, Thread/unthread left pants leg Pants- Performed by helper: Pull pants up/down   Non-skid slipper socks- Performed by helper: Don/doff right sock  Lower body assist        Toileting Toileting   Toileting steps completed by patient: Adjust clothing prior to toileting, Adjust clothing after toileting Toileting steps completed by helper: Performs perineal hygiene Toileting Assistive Devices: Grab bar or rail  Toileting assist Assist level: Touching or steadying assistance (Pt.75%)   Transfers Chair/bed transfer   Chair/bed transfer method: Stand pivot Chair/bed transfer assist level: Touching  or steadying assistance (Pt > 75%) Chair/bed transfer assistive device: Armrests, Medical sales representative       Assist level: 2 helpers(w/c follow, minA)   Wheelchair   Type: Manual Max wheelchair distance: 150 ft Assist Level: Supervision or verbal cues  Cognition Comprehension Comprehension assist level: Follows complex conversation/direction with no assist  Expression Expression assist level: Expresses complex ideas: With no assist  Social Interaction Social Interaction assist level: Interacts appropriately with others - No medications needed.  Problem Solving Problem solving assist level: Solves basic problems with no assist  Memory Memory assist level: Complete Independence: No helper    Medical Problem List and Plan: 1.  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    Continue CIR 2.  DVT Prophylaxis/Anticoagulation: SCD right lower extremity.    Lovenox started on 4/2 3. Pain Management/chronic pain syndrome: Neurontin 1200 mg 3 times daily, and Robaxin as needed   Oxycodone decreased to every 6 when necessary 4. Mood/bipolar disorder: Provide emotional support 5. Neuropsych: This patient is capable of making decisions on her own behalf. 6. Skin/Wound Care: Routine skin checks   DC VAC on 4/5 7. Fluids/Electrolytes/Nutrition: Routine I&O's  8.  Diabetes mellitus with peripheral neuropathy.  Latest hemoglobin A1c 13.8.     NovoLog 6 units 3 times daily   Lantus insulin 42 units every morning, and increase to 50 on 4/3   Victoza 1.8 mg every morning.     Check blood sugars before meals and at bedtime.  Diabetic teaching 9.  COPD.  CPAP. 10.  Morbid obesity.  Dietary follow-up 11.  Hypertension.  Coreg 3.125 mg twice daily.  Check blood sugars before meals and at bedtime   Monitor with increased mobility 12.  Hyperlipidemia.  Lipitor 13.  Constipation.  Laxative assistance 14.  Leukocytosis   WBCs 11.6 on 4/2   Cont to monitor 15. ABLA   Hb 8.6 on 4/2   Cont to monitor 16. Hyponatremia   Sodium 132 on 4/2   Continue to monitor 17. Constipation   Bowel regimen increased on 4/3  LOS (Days) 2 A FACE TO FACE EVALUATION WAS PERFORMED  Ankit Lorie Phenix 03/08/2018 8:02 AM

## 2018-03-08 NOTE — Consult Note (Addendum)
[facility] Nurse wound consult note Reason for Consult: Consult requested to troubleshoot Vac.  Suction is not on continuously and machine is reading "blockage." Prevena Incisional Vac was applied to left stump in the OR on 3/29, according to the EMR.  It is to remain in place for 7 days; pt is followed by Dr Sharol Given of the ortho team. Dressing procedure/placement/frequency: Prevena dressing is in place without leaks.  Suction tubing has few scattered clots in the tubing and in the cannister filter.  Re-applied the Vac track pad, which had some clots underneath and were probably causing the "blockage" alarm.  Changed Vac cannister which also had some small clots. No active bleeding and there was a small amt pink drainage in the cannister.  Applied padding under the Vac tubing to decrease pressure, then stocking over the area as requested by the otho team. Cont suction on at 156mm and machine functioning well. Please refer to Dr Sharol Given for further questions. Please re-consult if further assistance is needed.  Thank-you,  Julien Girt MSN, [place], [place], [place], [place]

## 2018-03-09 ENCOUNTER — Encounter: Payer: Self-pay | Admitting: Family Medicine

## 2018-03-09 ENCOUNTER — Inpatient Hospital Stay (HOSPITAL_COMMUNITY): Payer: Self-pay | Admitting: Occupational Therapy

## 2018-03-09 ENCOUNTER — Inpatient Hospital Stay (HOSPITAL_COMMUNITY): Payer: Self-pay | Admitting: Physical Therapy

## 2018-03-09 LAB — GLUCOSE, CAPILLARY
GLUCOSE-CAPILLARY: 220 mg/dL — AB (ref 65–99)
Glucose-Capillary: 163 mg/dL — ABNORMAL HIGH (ref 65–99)
Glucose-Capillary: 150 mg/dL — ABNORMAL HIGH (ref 65–99)
Glucose-Capillary: 152 mg/dL — ABNORMAL HIGH (ref 65–99)

## 2018-03-09 NOTE — Patient Care Conference (Signed)
Inpatient RehabilitationTeam Conference and Plan of Care Update Date: 03/08/2018   Time: 2:15 PM    Patient Name: [name]      Medical Record Number: [id number]  Date of Birth: [birth year] Sex: Female         Room/Bed: 4M03C/4M03C-01 Payor Info: Payor: MEDICAID [place] / Plan: MEDICAID [place] ACCESS / Product Type: *No Product type* /    Admitting Diagnosis: BKA  Admit Date/Time:  03/06/2018  2:47 PM Admission Comments: No comment available   Primary Diagnosis:  <principal problem not specified> Principal Problem: <principal problem not specified>  Patient Active Problem List   Diagnosis Date Noted  . Post-op pain   . Acute blood loss anemia   . Leukocytosis   . Allergic rhinitis 03/06/2018  . Amputation of left lower extremity below knee ([place]) 03/06/2018  . Unilateral complete BKA, left, sequela ([place])   . Chronic pain syndrome   . Neuropathic pain   . Poorly controlled type 2 diabetes mellitus with peripheral neuropathy ([place])   . Super-super obese ([place])   . Benign essential HTN   . Drug-induced constipation   . Acquired absence of leg below knee ([place]) 03/03/2018  . Dehiscence of amputation stump ([place])   . Amputated toe of left foot ([place]) 02/10/2018  . Acute osteomyelitis of ankle or foot, left ([place]) 01/31/2018  . Acute on chronic blood loss anemia   . AKI (acute kidney injury) ([place])   . Type 2 diabetes mellitus with foot ulcer, with long-term current use of insulin ([place])   . Chronic obstructive pulmonary disease ([place])   . Subacute osteomyelitis, left ankle and foot ([place])   . Diabetic foot ulcer ([place]) 10/29/2017  . Ulcer of skin ([place]) 10/14/2017  . Decreased pedal pulses 06/10/2017  . Adjustment disorder with mixed anxiety and depressed mood 11/04/2016  . History of medication noncompliance 11/04/2016  . Unilateral primary osteoarthritis, right knee 10/22/2016  . Diabetic neuropathy ([place]) 07/14/2016  . Syncope 02/25/2016  . De Quervain's tenosynovitis, bilateral 11/01/2015   . Vitamin D deficiency 09/05/2015  . Recurrent candidiasis of vagina 09/05/2015  . Varicose veins of leg with complications 83/38/2505  . Neuropathy, intercostal nerve 12/17/2014  . Rectal itching 11/17/2014  . Restless leg syndrome 10/17/2014  . Chronic sinusitis 07/18/2014  . Headache 07/15/2014  . Vision, loss, sudden 07/12/2014  . Urge incontinence 10/15/2013  . Encounter for chronic pain management 06/30/2013  . Low back pain 01/31/2013  . HLD (hyperlipidemia) 11/19/2012  . Chest pain 06/27/2012  . Right carpal tunnel syndrome 09/01/2011  . Bilateral knee pain 09/01/2011  . Diabetes mellitus ([place]) 05/22/2008  . Morbid obesity ([place]) 05/22/2008  . 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 Depression 05/22/2008  . Obstructive sleep apnea 05/22/2008  . Hypertension 05/22/2008  . Asthma 05/22/2008  . GERD 05/22/2008    Expected Discharge Date: Expected Discharge Date: 03/21/18  Team Members Present: Physician leading conference: Dr. Delice Lesch Social Worker Present: Alfonse Alpers, LCSW Nurse Present: Dorthula Nettles, RN PT Present: Kem Parkinson, PT OT Present: Clyda Greener, OT PPS Coordinator present : Daiva Nakayama, RN, CRRN     Current Status/Progress Goal Weekly Team Focus  Medical   Decreased functional mobility secondary to left transtibial amputation secondary to osteomyelitis of the left foot 03/03/2018  Improve pain, wound, hyponatremia, CBGs  See above   Bowel/Bladder   Continent of bladder pt has no BM since arrival- offered suppository and sorbitol pt refusing at present time  Continent of B/B with normal pattern  Assess B/B qs and prn administer laxatives prn   Swallow/Nutrition/ Hydration             ADL's   supervision for UB selfcare, min assist for LB bathing and min to mod for LB dressing sit to stand.  Min assist for stand pivot transfers  modified independent to supervision  selfcare retraining, transfer training, balance retraining,  pt/family education, DME education, UE strengthening, therapeutic activities.    Mobility   S bed mobility, minA transfers and gait x5' with RW and w/c follow  modI w/c level, S gait short distances in home  activity tolerance, LE/UE strengthening, gait training, ongoing amputee education   Communication             Safety/Cognition/ Behavioral Observations            Pain   3-3/10 pt better controlled oxycodone IR 15-10mg  mod prn 10-15 mg prn severe, Robaxin 500mg  prn tylenol prn Ultram 50mg  prn  Pain <=3/10  Assess pain QS and prn assess for relief notify MD for unrelieved pain   Skin   Wound Vac therapy LBKA on skin breakdown  Continue wound vac therapy wound care as directed assess for signs symptoms of infection maintain skin from breakdown  Assess wounds and skin QS and prn    Rehab Goals Patient on target to meet rehab goals: Yes Rehab Goals Revised: none *See Care Plan and progress notes for long and short-term goals.     Barriers to Discharge  Current Status/Progress Possible Resolutions Date Resolved   Physician    Medical stability;Weight;Weight bearing restrictions     See above  Therapies, follow labs, dc VAC this week, optimize DM meds      Nursing                  PT  Inaccessible home environment;Decreased caregiver support  intermittent assist at home, home not accessible to w/c              OT Inaccessible home environment  Wheelchair and walker may not fit in different rooms/doorways through the house.               SLP                SW                Discharge Planning/Teaching Needs:  Pt to return to her home she shares with her husband and her 40 y/o dtr.  Pt's goals are mod I, but family education can be offered to husband , as he requests.   Team Discussion:  Dr. Posey Pronto to wean pt from pain medications with h/o dependence/abuse.  Pt's wound VAC should come off in the next couple of days.  Pt is continent and reports lots of pain to RN.  Pt is at S  for upper body self care and min A for lower body self care.  Pt is using adaptive equipment.  Pt is sit to stand with min A.  Pt needs to be mod I with walker  due to little assistance at home and w/c not fitting in home, but if pt progresses faster than expected, d/c date can be moved sooner.  OT helping pt problem solve pericare.  Pt is min guard/min A walking up to 5' due to poor endurance.   Revisions to Treatment Plan:  none    Continued Need for Acute Rehabilitation Level of Care: The patient requires daily medical management  by a physician with specialized training in physical medicine and rehabilitation for the following conditions: Daily direction of a multidisciplinary physical rehabilitation program to ensure safe treatment while eliciting the highest outcome that is of practical value to the patient.: Yes Daily medical management of patient stability for increased activity during participation in an intensive rehabilitation regime.: Yes Daily analysis of laboratory values and/or radiology reports with any subsequent need for medication adjustment of medical intervention for : Post surgical problems;Wound care problems;Diabetes problems;Other  Shaquasha Gerstel, Silvestre Mesi 03/09/2018, 12:18 AM

## 2018-03-09 NOTE — Progress Notes (Signed)
Physical Therapy Session Note  Patient Details  Name: [name] MRN: [id number] Date of Birth: [birth year]  Today's Date: 03/09/2018 PT Missed Time: 2 Minutes Missed Time Reason: Pain;Patient unwilling to participate  Pt refusing to participate 2/2 constipation pain, declined assist w/ repositioning for comfort and pain relief. RN aware.  Charlann Wayne K Arnette 03/09/2018, 4:04 PM

## 2018-03-09 NOTE — Progress Notes (Signed)
Occupational Therapy Session Note  Patient Details  Name: [name] MRN: [id number] Date of Birth: 1976-04-20  Today's Date: 03/09/2018 OT Individual Time: 3837-7939 OT Individual Time Calculation (min): 58 min    Short Term Goals: Week 1:  OT Short Term Goal 1 (Week 1): STGs equal to LTGs based on ELOS  Skilled Therapeutic Interventions/Progress Updates:    Pt with increased pain in her left residual limb this session and also not feeling well secondary to not having a BM in a few days.  Nursing had administered suppository as well.  Completed toilet transfer X 2 with min assist stand pivot during session.  She was able to complete some toilet hygiene in sitting but needed max assist for thoroughness in standing.  Min assist for transfer back to the edge of the bed.  She was able to return to supine with supervision with call button and phone in reach.   Therapy Documentation Precautions:  Precautions Precautions: Fall Restrictions Weight Bearing Restrictions: Yes LLE Weight Bearing: Non weight bearing  Pain: Pain Assessment Pain Scale: 0-10 Pain Score: 8  Pain Type: Surgical pain Pain Location: Knee Pain Orientation: Left Pain Descriptors / Indicators: Discomfort Pain Onset: With Activity Pain Intervention(s): Repositioned;Emotional support ADL: See Function Navigator for Current Functional Status.   Therapy/Group: Individual Therapy  [name] OTR/L 03/09/2018, 12:16 PM

## 2018-03-09 NOTE — Progress Notes (Signed)
Pt places self on home cpap unit for the night. RT will monitor as needed.

## 2018-03-09 NOTE — Progress Notes (Signed)
[facility] PHYSICAL MEDICINE & REHABILITATION     PROGRESS NOTE  Subjective/Complaints:  Patient seen lying in bed this morning. She states she did not sleep well overnight because she states that they kept hitting her stump on her walker. She states she feels "terrible" this AM.  ROS:  Denies CP, SOB, N/V/D.  Objective: Vital Signs: Blood pressure 135/75, pulse 100, temperature 98.3 F (36.8 C), temperature source Oral, resp. rate 20, height 5\' 2"  (1.575 m), weight (!) 146.7 kg (323 lb 6.6 oz), last menstrual period 02/17/2018, SpO2 95 %. No results found. Recent Labs    03/07/18 0646  WBC 11.6*  HGB 8.6*  HCT 27.0*  PLT 311   Recent Labs    03/07/18 0646  NA 132*  K 3.8  CL 96*  GLUCOSE 258*  BUN 20  CREATININE 0.85  CALCIUM 8.3*   CBG (last 3)  Recent Labs    03/08/18 1708 03/08/18 2159 03/09/18 0712  GLUCAP 176* 192* 220*    Wt Readings from Last 3 Encounters:  03/06/18 (!) 146.7 kg (323 lb 6.6 oz)  03/03/18 (!) 146.1 kg (322 lb)  02/23/18 (!) 146.1 kg (322 lb)    Physical Exam:  BP 135/75 (BP Location: Left Arm)   Pulse 100   Temp 98.3 F (36.8 C) (Oral)   Resp 20   Ht 5\' 2"  (1.575 m)   Wt (!) 146.7 kg (323 lb 6.6 oz)   LMP 02/17/2018   SpO2 95%   BMI 59.15 kg/m  Constitutional: She appears well-developed. Obese. + distressed HENT: Normocephalic and atraumatic.  Eyes: EOM are normal. No discharge.  Cardiovascular: RRR and no JVD. Respiratory: Breath sounds normal. clear. GI: Bowel sounds are normal. She exhibits no distension.  Musculoskeletal: Left stump extremely TTP  Neurological: She is alert.  Motor:  4/5 Left hip flexion (unchanged) Skin: +VAC left stump  Psychiatric: She has a normal mood and affect. Her behavior is normal.   Assessment/Plan: 1. Functional deficits secondary to left BKA which require 3+ hours per day of interdisciplinary therapy in a comprehensive inpatient rehab setting. Physiatrist is providing close team  supervision and 24 hour management of active medical problems listed below. Physiatrist and rehab team continue to assess barriers to discharge/monitor patient progress toward functional and medical goals.  Function:  Bathing Bathing position   Position: Sitting EOB  Bathing parts Body parts bathed by patient: Right arm, Left arm, Chest, Abdomen, Front perineal area, Right upper leg, Left upper leg, Right lower leg Body parts bathed by helper: Buttocks, Back  Bathing assist        Upper Body Dressing/Undressing Upper body dressing   What is the patient wearing?: Pull over shirt/dress, Bra Bra - Perfomed by patient: Thread/unthread right bra strap, Thread/unthread left bra strap, Hook/unhook bra (pull down sports bra)   Pull over shirt/dress - Perfomed by patient: Thread/unthread right sleeve, Thread/unthread left sleeve, Put head through opening, Pull shirt over trunk          Upper body assist Assist Level: Set up      Lower Body Dressing/Undressing Lower body dressing   What is the patient wearing?: Pants, Underwear, Non-skid slipper socks Underwear - Performed by patient: Thread/unthread right underwear leg, Thread/unthread left underwear leg, Pull underwear up/down Underwear - Performed by helper: Pull underwear up/down Pants- Performed by patient: Thread/unthread right pants leg, Thread/unthread left pants leg, Pull pants up/down Pants- Performed by helper: Pull pants up/down Non-skid slipper socks- Performed by patient: Don/doff right  sock Non-skid slipper socks- Performed by helper: Don/doff right sock                  Lower body assist Assist for lower body dressing: Touching or steadying assistance (Pt > 75%)      Toileting Toileting   Toileting steps completed by patient: Performs perineal hygiene Toileting steps completed by helper: Adjust clothing after toileting Toileting Assistive Devices: (walker)  Toileting assist Assist level: Touching or steadying  assistance (Pt.75%)   Transfers Chair/bed transfer   Chair/bed transfer method: Stand pivot Chair/bed transfer assist level: Touching or steadying assistance (Pt > 75%) Chair/bed transfer assistive device: Armrests, Medical sales representative     Max distance: 5 Assist level: Touching or steadying assistance (Pt > 75%)   Wheelchair   Type: Manual Max wheelchair distance: 150 Assist Level: Supervision or verbal cues  Cognition Comprehension Comprehension assist level: Follows complex conversation/direction with no assist  Expression Expression assist level: Expresses complex ideas: With no assist  Social Interaction Social Interaction assist level: Interacts appropriately with others - No medications needed.  Problem Solving Problem solving assist level: Solves basic problems with no assist  Memory Memory assist level: Complete Independence: No helper    Medical Problem List and Plan: 1.  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    Continue CIR 2.  DVT Prophylaxis/Anticoagulation: SCD right lower extremity.    Lovenox started on 4/2 3. Pain Management/chronic pain syndrome: Neurontin 1200 mg 3 times daily, and Robaxin as needed   Oxycodone decreased to every 6 when necessary 4. Mood/bipolar disorder: Provide emotional support 5. Neuropsych: This patient is capable of making decisions on her own behalf. 6. Skin/Wound Care: Routine skin checks 7. Fluids/Electrolytes/Nutrition: Routine I&O's  8.  Diabetes mellitus with peripheral neuropathy.  Latest hemoglobin A1c 13.8.     NovoLog 6 units 3 times daily   Lantus insulin 42 units every morning, increased to 50 on 4/3   Victoza 1.8 mg every morning.     Check blood sugars before meals and at bedtime.  Diabetic teaching   Will consider further increase to medications tomorrow 9.  COPD.  CPAP. 10.  Morbid obesity.  Dietary follow-up 11.   Hypertension.  Coreg 3.125 mg twice daily.  Check blood sugars before meals and at bedtime   Relatively controlled on 4/4 12.  Hyperlipidemia.  Lipitor 13.  Constipation.  Laxative assistance 14. Leukocytosis   WBCs 11.6 on 4/2   Labs ordered for tomorrow   Cont to monitor 15. ABLA   Hb 8.6 on 4/2   Labs ordered for tomorrow   Cont to monitor 16. Hyponatremia   Sodium 132 on 4/2   Labs ordered for tomorrow   Continue to monitor 17. Constipation   Bowel regimen increased on 4/3  LOS (Days) 3 A FACE TO FACE EVALUATION WAS PERFORMED  Ankit Lorie Phenix 03/09/2018 8:39 AM

## 2018-03-09 NOTE — Progress Notes (Signed)
Received forms to complete for personal care services assessment request. Patient had told me to expect these forms at her recent office visit.  Forms completed. Will scan into chart & fax back to [facility] in [place].  [name], MD

## 2018-03-09 NOTE — Progress Notes (Signed)
Social Work Patient ID: [name], female   DOB: 01/06/1976, 42 y.o.   MRN: [id number]   CSW spoke with pt via telephone due to not getting to see her in her room today following team conference.  Updated her on team conference discussion and targeted d/c date of 03-21-18.  CSW explained that d/c date can be moved sooner if pt progresses to mod I and would be safe at home with intermittent support.  She expressed understanding, but seemed a little disappointed in almost two more weeks.  CSW will continue to follow and assist pt as needed.

## 2018-03-09 NOTE — Progress Notes (Signed)
Physical Therapy Session Note  Patient Details  Name: [name] MRN: [id number] Date of Birth: Nov 04, 1976  Today's Date: 03/09/2018 PT Individual Time: 9373-4287 PT Individual Time Calculation (min): 25 min   Short Term Goals: Week 1:  PT Short Term Goal 1 (Week 1): =LTG due to estimated LOS  Skilled Therapeutic Interventions/Progress Updates: Pt received asleep in bed, easily awoken but initially declining participation d/t abdominal pain from constipation and from LLE residual limb reporting she was "up and down" all night trying to use the restroom and it was bumped several times. Pt then reports needing to use restroom. Supine>sit with S and bedrails. Stand pivot transfer with RW to BSC min guard. Required several minutes sitting on BSC with only very small bowel movement. Pt performed buttocks hygiene but requested therapist perform also to ensure cleanliness. Returned to bed with min guard stand pivot with RW. Sit >supine with S. Pt declined additional participation due to continued pain from constipation. Therapist applied ice packs to L residual limb, adjusted shrinker sock to reduce tourniquet-like effect from sock rolling down. Remained supine in bed, alarm intact, all needs in reach.      Therapy Documentation Precautions:  Precautions Precautions: Fall Restrictions Weight Bearing Restrictions: Yes LLE Weight Bearing: Non weight bearing General: PT Amount of Missed Time (min): 35 Minutes PT Missed Treatment Reason: Pain Pain: Pain Assessment Pain Scale: 0-10 Pain Score: 7  Pain Type: Surgical pain Pain Location: Leg Pain Orientation: Left Pain Descriptors / Indicators: Burning;Throbbing;Spasm Pain Frequency: Intermittent Pain Onset: On-going Patients Stated Pain Goal: 3 Pain Intervention(s): Medication (See eMAR)   See Function Navigator for Current Functional Status.   Therapy/Group: Individual Therapy  Luberta Mutter 03/09/2018, 9:04 AM

## 2018-03-09 NOTE — Progress Notes (Signed)
Occupational Therapy Note  Patient Details  Name: [name] MRN: [id number] Date of Birth: [birth year]  Today's Date: 03/09/2018 OT Missed Time: 100 Minutes Missed Time Reason: Patient fatigue;Patient ill (comment)  Pt asleep and reporting not feeling well due to being impacted and unable to have a BM. RN aware.    [name] [facility] 03/09/2018, 1:33 PM

## 2018-03-10 ENCOUNTER — Inpatient Hospital Stay (HOSPITAL_COMMUNITY): Payer: Self-pay | Admitting: Occupational Therapy

## 2018-03-10 ENCOUNTER — Inpatient Hospital Stay (HOSPITAL_COMMUNITY): Payer: Self-pay | Admitting: Physical Therapy

## 2018-03-10 LAB — CBC WITH DIFFERENTIAL/PLATELET
Basophils Absolute: 0 10*3/uL (ref 0.0–0.1)
Basophils Relative: 0 %
EOS PCT: 3 %
Eosinophils Absolute: 0.3 10*3/uL (ref 0.0–0.7)
HCT: 25.3 % — ABNORMAL LOW (ref 36.0–46.0)
Hemoglobin: 8 g/dL — ABNORMAL LOW (ref 12.0–15.0)
LYMPHS ABS: 3.6 10*3/uL (ref 0.7–4.0)
LYMPHS PCT: 35 %
MCH: 28 pg (ref 26.0–34.0)
MCHC: 31.6 g/dL (ref 30.0–36.0)
MCV: 88.5 fL (ref 78.0–100.0)
MONOS PCT: 10 %
Monocytes Absolute: 1 10*3/uL (ref 0.1–1.0)
Neutro Abs: 5.4 10*3/uL (ref 1.7–7.7)
Neutrophils Relative %: 52 %
Platelets: 359 10*3/uL (ref 150–400)
RBC: 2.86 MIL/uL — AB (ref 3.87–5.11)
RDW: 13.2 % (ref 11.5–15.5)
WBC: 10.3 10*3/uL (ref 4.0–10.5)

## 2018-03-10 LAB — GLUCOSE, CAPILLARY
GLUCOSE-CAPILLARY: 152 mg/dL — AB (ref 65–99)
Glucose-Capillary: 166 mg/dL — ABNORMAL HIGH (ref 65–99)
Glucose-Capillary: 160 mg/dL — ABNORMAL HIGH (ref 65–99)
Glucose-Capillary: 119 mg/dL — ABNORMAL HIGH (ref 65–99)

## 2018-03-10 LAB — BASIC METABOLIC PANEL
Anion gap: 9 (ref 5–15)
BUN: 11 mg/dL (ref 6–20)
CHLORIDE: 101 mmol/L (ref 101–111)
CO2: 27 mmol/L (ref 22–32)
Calcium: 8.4 mg/dL — ABNORMAL LOW (ref 8.9–10.3)
Creatinine, Ser: 0.63 mg/dL (ref 0.44–1.00)
GFR calc Af Amer: >60 mL/min (ref >60)
GFR calc non Af Amer: >60 mL/min (ref >60)
Glucose, Bld: 181 mg/dL — ABNORMAL HIGH (ref 65–99)
POTASSIUM: 3.3 mmol/L — AB (ref 3.5–5.1)
SODIUM: 137 mmol/L (ref 135–145)

## 2018-03-10 MED ORDER — POTASSIUM CHLORIDE CRYS ER 10 MEQ PO TBCR
10.0000 meq | EXTENDED_RELEASE_TABLET | Freq: Every day | ORAL | Status: DC
  Administered 2018-03-10 – 2018-03-16 (×7): 10 meq via ORAL
  Filled 2018-03-10 (×7): qty 1

## 2018-03-10 NOTE — Progress Notes (Signed)
Occupational Therapy Session Note  Patient Details  Name: [name] MRN: [id number] Date of Birth: [birth year]  Today's Date: 03/10/2018 OT Individual Time: 0998-3382 OT Individual Time Calculation (min): 73 min    Short Term Goals: Week 1:  OT Short Term Goal 1 (Week 1): STGs equal to LTGs based on ELOS  Skilled Therapeutic Interventions/Progress Updates:    Pt completed shower and dressing to start session.  Therapist wrapped up her left residual limb and then she transferred stand pivot to the tub bench from the wheelchair with min assist.  Pt completed bathing with supervision in sitting, however she was unable to attempt washing peri area or buttocks secondary to her limited space and size.  She transferred out to the wheelchair with min assist and then over to the bed at the same level.  Pt leaned sideways on the bed in order to prop up her leg for washing her front peri area.  Therapist had to assist with washing buttocks in standing as she cannot thoroughly reach this.  Have attempted using LH sponge for this but it is not efficient.  Also discussed possibility of using a toilet aide or LH tongs to assist with this as well.  Min assist for LB dressing sit to stand with use of the reacher and sockaide.  UB dressing with supervision.  She transferred back to the wheelchair for oral hygiene at the sink with setup.  Next, therapist pushed pt down to the dayroom where she completed 2 sets of 5 minutes using the UE ergonometer.  Resistance set on level 5 with 1 set completed peddling forward and one set peddling in reverse.  Finished session with return to the room and pt transferring back to the bed with min assist.  She was able to transfer to supine with supervision.  Call button and phone in reach.    Therapy Documentation Precautions:  Precautions Precautions: Fall Restrictions Weight Bearing Restrictions: Yes LLE Weight Bearing: Non weight bearing  Pain: Pain Assessment Pain Scale:  Faces Faces Pain Scale: Hurts a little bit Pain Type: Surgical pain Pain Location: Leg Pain Orientation: Left Pain Descriptors / Indicators: Discomfort;Grimacing Pain Onset: With Activity Pain Intervention(s): Emotional support;Repositioned ADL: See Function Navigator for Current Functional Status.   Therapy/Group: Individual Therapy  Dalan Cowger OTR/L 03/10/2018, 4:08 PM

## 2018-03-10 NOTE — Progress Notes (Signed)
[place] PHYSICAL MEDICINE & REHABILITATION     PROGRESS NOTE  Subjective/Complaints:   No issues overnite ROS:  Denies CP, SOB, N/V/D.  Objective: Vital Signs: Blood pressure 108/62, pulse 94, temperature 98.4 F (36.9 C), temperature source Oral, resp. rate 17, height 5\' 2"  (1.575 m), weight (!) 146.7 kg (323 lb 6.6 oz), last menstrual period 02/17/2018, SpO2 95 %. No results found. Recent Labs    03/10/18 0709  WBC 10.3  HGB 8.0*  HCT 25.3*  PLT 359   Recent Labs    03/10/18 0709  NA 137  K 3.3*  CL 101  GLUCOSE 181*  BUN 11  CREATININE 0.63  CALCIUM 8.4*   CBG (last 3)  Recent Labs    03/09/18 1630 03/09/18 2102 03/10/18 0655  GLUCAP 150* 152* 166*    Wt Readings from Last 3 Encounters:  03/06/18 (!) 146.7 kg (323 lb 6.6 oz)  03/03/18 (!) 146.1 kg (322 lb)  02/23/18 (!) 146.1 kg (322 lb)    Physical Exam:  BP 108/62 (BP Location: Left Arm)   Pulse 94   Temp 98.4 F (36.9 C) (Oral)   Resp 17   Ht 5\' 2"  (1.575 m)   Wt (!) 146.7 kg (323 lb 6.6 oz)   LMP 02/17/2018   SpO2 95%   BMI 59.15 kg/m  Constitutional: She appears well-developed. Obese. + distressed HENT: Normocephalic and atraumatic.  Eyes: EOM are normal. No discharge.  Cardiovascular: RRR and no JVD. Respiratory: Breath sounds normal. clear. GI: Bowel sounds are normal. She exhibits no distension.  Musculoskeletal: Left stump extremely TTP  Neurological: She is alert.  Motor:  4/5 Left hip flexion (unchanged) Skin: +VAC left stump  Psychiatric: She has a normal mood and affect. Her behavior is normal.   Assessment/Plan: 1. Functional deficits secondary to left BKA which require 3+ hours per day of interdisciplinary therapy in a comprehensive inpatient rehab setting. Physiatrist is providing close team supervision and 24 hour management of active medical problems listed below. Physiatrist and rehab team continue to assess barriers to discharge/monitor patient progress toward  functional and medical goals.  Function:  Bathing Bathing position   Position: Sitting EOB  Bathing parts Body parts bathed by patient: Right arm, Left arm, Chest, Abdomen, Front perineal area, Right upper leg, Left upper leg, Right lower leg Body parts bathed by helper: Buttocks, Back  Bathing assist Assist Level: (minimal assist, 78%)      Upper Body Dressing/Undressing Upper body dressing   What is the patient wearing?: Pull over shirt/dress, Bra Bra - Perfomed by patient: Thread/unthread right bra strap, Thread/unthread left bra strap, Hook/unhook bra (pull down sports bra)   Pull over shirt/dress - Perfomed by patient: Thread/unthread right sleeve, Thread/unthread left sleeve, Put head through opening, Pull shirt over trunk          Upper body assist Assist Level: Set up      Lower Body Dressing/Undressing Lower body dressing   What is the patient wearing?: Pants, Underwear, Non-skid slipper socks Underwear - Performed by patient: Thread/unthread right underwear leg, Thread/unthread left underwear leg, Pull underwear up/down Underwear - Performed by helper: Pull underwear up/down Pants- Performed by patient: Thread/unthread right pants leg, Thread/unthread left pants leg, Pull pants up/down Pants- Performed by helper: Pull pants up/down Non-skid slipper socks- Performed by patient: Don/doff right sock Non-skid slipper socks- Performed by helper: Don/doff right sock                  Lower  body assist Assist for lower body dressing: Touching or steadying assistance (Pt > 75%)      Toileting Toileting   Toileting steps completed by patient: Performs perineal hygiene Toileting steps completed by helper: Adjust clothing prior to toileting, Performs perineal hygiene, Adjust clothing after toileting(Pt with hospital gown) Toileting Assistive Devices: (walker)  Toileting assist Assist level: Touching or steadying assistance (Pt.75%)   Transfers Chair/bed transfer    Chair/bed transfer method: Stand pivot Chair/bed transfer assist level: Touching or steadying assistance (Pt > 75%) Chair/bed transfer assistive device: Armrests, Medical sales representative     Max distance: 5 Assist level: Touching or steadying assistance (Pt > 75%)   Wheelchair   Type: Manual Max wheelchair distance: 150 Assist Level: Supervision or verbal cues  Cognition Comprehension Comprehension assist level: Follows complex conversation/direction with no assist  Expression Expression assist level: Expresses complex ideas: With no assist  Social Interaction Social Interaction assist level: Interacts appropriately with others - No medications needed.  Problem Solving Problem solving assist level: Solves basic problems with no assist  Memory Memory assist level: Complete Independence: No helper    Medical Problem List and Plan: 1.  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    Continue CIR PT, OT,  2.  DVT Prophylaxis/Anticoagulation: SCD right lower extremity.    Lovenox started on 4/2 3. Pain Management/chronic pain syndrome: Neurontin 1200 mg 3 times daily, and Robaxin as needed   Oxycodone decreased to every 6 when necessary 4. Mood/bipolar disorder: Provide emotional support 5. Neuropsych: This patient is capable of making decisions on her own behalf. 6. Skin/Wound Care: Routine skin checks 7. Fluids/Electrolytes/Nutrition: Routine I&O's  8.  Diabetes mellitus with peripheral neuropathy.  Latest hemoglobin A1c 13.8.     NovoLog 6 units 3 times daily   Lantus insulin 42 units every morning, increased to 50 on 4/3   Victoza 1.8 mg every morning.     Check blood sugars before meals and at bedtime.  Diabetic teaching   Will consider further increase to medications tomorrow 9.  COPD.  CPAP. 10.  Morbid obesity.  Dietary follow-up 11.  Hypertension.  Coreg 3.125 mg twice daily.   Check blood sugars before meals and at bedtime   Relatively controlled on 4/4 12.  Hyperlipidemia.  Lipitor 13.  Constipation.  Laxative assistance, senna 2 qhs 14. Leukocytosis   WBCs 11.6 on 4/2   Labs ordered for tomorrow   Cont to monitor 15. ABLA   Hb 8.6 on 4/2   Labs ordered for tomorrow   Cont to monitor 16. Hyponatremia   Na 137 4/5 resolved 17.  Hypokalemia- KCL supplement  LOS (Days) 4 A FACE TO FACE EVALUATION WAS PERFORMED  Charlett Blake 03/10/2018 9:11 AM

## 2018-03-10 NOTE — Progress Notes (Signed)
Physical Therapy Session Note  Patient Details  Name: [name] MRN: [id number] Date of Birth: August 02, 1976  Today's Date: 03/10/2018 PT Individual Time: 0900-1000 PT Individual Time Calculation (min): 60 min   Short Term Goals: Week 1:  PT Short Term Goal 1 (Week 1): =LTG due to estimated LOS  Skilled Therapeutic Interventions/Progress Updates: Pt received seated on EOB, RN present administering pain medication. Pt dons pants in sitting to thread LLE, with S for standing balance with RW. Pt doffs non-skid socks with reacher, dons regular sock with sockaide and adjusts sock with reacher, dons shoe setupA. Stand pivot transfer bed>w/c with RW and min guard. W/c propulsion with S throughout session. Gait 2x10' with RW and min guard. Discussed w/c fit into house, pt reporting current w/c she received approx 1 month ago is too wide to fit in doorways, and although she is getting a ramp put in for home entry, she would need to hop one threshold step with a RW. Provided home measurement sheet and discussed options for switching out chair to a smaller width chair to allow for access in front door and into all rooms in the house. Pt to follow up with husband regarding measurements. Squat pivot transfer w/c <>mat table min guard with cues for technique. Performed supine and sidelying straight leg raise BLE 2x15 reps. Returned to bed squat pivot minA. Sit >supine with S. Remained supine in bed, all needs in reach.     Therapy Documentation Precautions:  Precautions Precautions: Fall Restrictions Weight Bearing Restrictions: Yes LLE Weight Bearing: Non weight bearing   See Function Navigator for Current Functional Status.   Therapy/Group: Individual Therapy  Luberta Mutter 03/10/2018, 10:50 AM

## 2018-03-10 NOTE — Progress Notes (Signed)
Physical Therapy Session Note  Patient Details  Name: Kingslee Dowse MRN: [id number] Date of Birth: [birth year]  Today's Date: 03/10/2018 PT Individual Time: 1100-1200 PT Individual Time Calculation (min): 60 min   Short Term Goals: Week 1:  PT Short Term Goal 1 (Week 1): =LTG due to estimated LOS  Skilled Therapeutic Interventions/Progress Updates:    Pt supine in bed having wound vac removed by nursing upon therapist arrival. Pt does not rate pain but cries out in significant pain as nurse removes wound vac from LLE. Assisted nursing with rewrapping L residual limb following wound vac removal. Pt agreeable to participate in therapy session. Bed mobility SBA with use of bedrails and overhead trapeze. Stand pivot transfer bed to w/c with min assist with RW. Manual w/c propulsion 2 x 150 ft with SBA and increased time due to fatigue. Sit to stand min assist in // bars. Standing LLE therex: hip flex, hip abd, hip ext, knee curls x 15 reps each. Assisted pt with building up L residual limb support due to it not being level and causing pt discomfort. Assisted pt with donning LLE shrinker at end of session. Pt left seated EOB with needs in reach and family present.  Therapy Documentation Precautions:  Precautions Precautions: Fall Restrictions Weight Bearing Restrictions: Yes LLE Weight Bearing: Non weight bearing  See Function Navigator for Current Functional Status.   Therapy/Group: Individual Therapy   Excell Seltzer, PT, DPT  03/10/2018, 3:43 PM

## 2018-03-11 ENCOUNTER — Inpatient Hospital Stay (HOSPITAL_COMMUNITY): Payer: Self-pay | Admitting: Occupational Therapy

## 2018-03-11 ENCOUNTER — Inpatient Hospital Stay (HOSPITAL_COMMUNITY): Payer: Medicaid Other

## 2018-03-11 LAB — GLUCOSE, CAPILLARY
Glucose-Capillary: 179 mg/dL — ABNORMAL HIGH (ref 65–99)
Glucose-Capillary: 192 mg/dL — ABNORMAL HIGH (ref 65–99)
Glucose-Capillary: 174 mg/dL — ABNORMAL HIGH (ref 65–99)
Glucose-Capillary: 161 mg/dL — ABNORMAL HIGH (ref 65–99)

## 2018-03-11 NOTE — Progress Notes (Signed)
[name] is a 42 y.o. female November 21, 1976 [id number]  Subjective: Request [place] to leave the room and see the spring weather outside with her daughter.  Also concerned about increased swelling at stump since discontinuation of wound VAC yesterday.  Reports dressing continues to roll down and is not remaining in place.  Denies significant pain or other problems.  Objective: Vital signs in last 24 hours: Temp:  [98.2 F (36.8 C)-98.7 F (37.1 C)] 98.2 F (36.8 C) (04/06 0557) Pulse Rate:  [87-96] 87 (04/06 0557) Resp:  [17-18] 17 (04/06 0557) BP: (113-127)/(62-68) 113/62 (04/06 0557) SpO2:  [95 %-97 %] 97 % (04/06 0557) Weight change:  Last BM Date: 03/10/18  Intake/Output from previous day: 04/05 0701 - 04/06 0700 In: 564 [P.O.:564] Out: -   Physical Exam General: No apparent distress   daughter in bedside chair Lungs: Normal effort. Lungs clear to auscultation, no crackles or wheezes. Cardiovascular: Regular rate and rhythm, no edema Wounds: L BKA stump netting over dressing that is clean dry and intact.   Lab Results: BMET    Component Value Date/Time   NA 137 03/10/2018 0709   NA 139 07/28/2017 1557   K 3.3 (L) 03/10/2018 0709   CL 101 03/10/2018 0709   CO2 27 03/10/2018 0709   GLUCOSE 181 (H) 03/10/2018 0709   BUN 11 03/10/2018 0709   BUN 16 07/28/2017 1557   CREATININE 0.63 03/10/2018 0709   CREATININE 0.51 08/05/2015 1114   CALCIUM 8.4 (L) 03/10/2018 0709   GFRNONAA >60 03/10/2018 0709   GFRAA >60 03/10/2018 0709   CBC    Component Value Date/Time   WBC 10.3 03/10/2018 0709   RBC 2.86 (L) 03/10/2018 0709   HGB 8.0 (L) 03/10/2018 0709   HGB 13.1 07/28/2017 1557   HCT 25.3 (L) 03/10/2018 0709   HCT 40.4 07/28/2017 1557   PLT 359 03/10/2018 0709   PLT 335 07/28/2017 1557   MCV 88.5 03/10/2018 0709   MCV 93 07/28/2017 1557   MCH 28.0 03/10/2018 0709   MCHC 31.6 03/10/2018 0709   RDW 13.2 03/10/2018 0709   RDW 13.4 07/28/2017 1557   LYMPHSABS 3.6  03/10/2018 0709   MONOABS 1.0 03/10/2018 0709   EOSABS 0.3 03/10/2018 0709   BASOSABS 0.0 03/10/2018 0709   CBG's (last 3):   Recent Labs    03/10/18 2106 03/11/18 0554 03/11/18 1205  GLUCAP 152* 179* 192*   LFT's Lab Results  Component Value Date   ALT 10 (L) 03/07/2018   AST 10 (L) 03/07/2018   ALKPHOS 105 03/07/2018   BILITOT 0.4 03/07/2018    Studies/Results: No results found.  Medications:  I have reviewed the patient's current medications. Scheduled Medications: . atorvastatin  20 mg Oral q1800  . carvedilol  3.125 mg Oral BID WC  . enoxaparin (LOVENOX) injection  0.5 mg/kg Subcutaneous Q24H  . gabapentin  1,200 mg Oral TID  . insulin aspart  0-20 Units Subcutaneous TID WC  . insulin aspart  6 Units Subcutaneous TID WC  . insulin glargine  50 Units Subcutaneous q morning - 10a  . mometasone-formoterol  2 puff Inhalation BID  . nitroGLYCERIN  0.2 mg Transdermal Daily  . pantoprazole  40 mg Oral Daily  . polyethylene glycol  17 g Oral BID  . potassium chloride  10 mEq Oral Daily  . 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  Left lower extremity BKA March 29 secondary to osteomyelitis.  Continue dressing/wound care as ongoing and inpatient CIR therapy with PT and OT as ordered.  No changes recommended 2.  Type 2 diabetes with peripheral neuropathy, uncontrolled prior to admission.  CBGs  reasonably controlled on current therapies.  Continue same. 3.  Obstructive sleep apnea requiring CPAP.  Continue machine therapies as at home 4.  Hypertension.  Blood pressures well controlled on current therapy.  No changes recommended   Valerie A. Asa Lente, MD 03/11/2018, 12:50 PM

## 2018-03-11 NOTE — Progress Notes (Signed)
Pt has her home cpap set up at the bedside. Pt states that she will put it on when she is ready for bed.

## 2018-03-11 NOTE — Progress Notes (Signed)
Occupational Therapy Session Note  Patient Details  Name: [name] MRN: [id number] Date of Birth: 1976-03-10  Today's Date: 03/11/2018 OT Individual Time: 1300-1340 OT Individual Time Calculation (min): 40 min   Short Term Goals: Week 1:  OT Short Term Goal 1 (Week 1): STGs equal to LTGs based on ELOS     Skilled Therapeutic Interventions/Progress Updates:    Pt greeted supine in bed. Reporting significant pain in residual limb and requesting to complete B/D from bed. Tx focus on sitting balance, sit<stands, and functional transfers during bathing, dressing, and toileting tasks. Pt bathing in long sitting and EOB positions utilizing lateral leans for anterior pericare. Stand pivot with RW completed with steady assist to [facility]. Max A for perihygiene with pt able to complete clothing mgt herself. Once she returned EOB, rewrapped ACE bandage and instructed dtr on figure 8 technique. At end of tx pt opted to remain in bed vs w/c. She was left with all needs within reach.    Therapy Documentation Precautions:  Precautions Precautions: Fall Restrictions Weight Bearing Restrictions: Yes LLE Weight Bearing: Non weight bearing Pain: in residual limb. Discussed tapping/massage to provide relief. Per pt, RN already aware  Pain Assessment Pain Scale: 0-10 Pain Score: 7  Pain Type: Surgical pain Pain Location: Leg Pain Orientation: Left Pain Descriptors / Indicators: Aching Pain Frequency: Intermittent Pain Onset: On-going Patients Stated Pain Goal: 2 Pain Intervention(s): Medication (See eMAR);Repositioned Multiple Pain Sites: No ADL:      See Function Navigator for Current Functional Status.   Therapy/Group: Individual Therapy  Marciana Uplinger A Tavon Magnussen 03/11/2018, 1:56 PM

## 2018-03-11 NOTE — Progress Notes (Signed)
Physical Therapy Note  Patient Details  Name: [name] MRN: [id number] Date of Birth: 11-23-1976 Today's Date: 03/11/2018  1020-1105, 45 min individual tx  Pain: 8/10 L residual limb; premedicated  Pt reported that her residual limb was swollen and painful.  Pt noted to have 2 ACE wraps distally on limb, covered by shrinker sock, with constriction at top of ACEs.  PT talked to Maudry Mayhew, RN who is awaiting MD.  PT removed 2 ACES which were dry, and left Kerlix on wound, which had one small dried bloody area, and donned new shrinker sock.  +2 to don shrinker sock as pt is so sensitive on distal end of residual limb. PT educated pt on positioning in bed with residual limb aligned with hip/torso, and avoid L knee flexion for now as limb was constricted at that point; pt had been elevating limb on 2 pillows. Pt reported she felt a little better after removal of ACEs.   Therapeutic exercise performed with LE to increase strength for functional mobility.: 15 x 1 R unilateral bridging.  Pt reported she would like to get OOB and ambulate.  Gait training with RW in room, x 10' x 2.  Pt transferred back to bed and left resting with all needs at hand, with residual limb positioned for edema management, and her dtr present.  See function navigator for current status.  Anay Rathe 03/11/2018, 8:24 AM

## 2018-03-11 NOTE — Progress Notes (Signed)
Pt. Got ground pass and left the floor more than two hours.Pt. Was reinforced about not to leave the floor more than an hour.

## 2018-03-12 ENCOUNTER — Inpatient Hospital Stay (HOSPITAL_COMMUNITY): Payer: Self-pay

## 2018-03-12 ENCOUNTER — Inpatient Hospital Stay (HOSPITAL_COMMUNITY): Payer: Self-pay | Admitting: Physical Therapy

## 2018-03-12 LAB — GLUCOSE, CAPILLARY
GLUCOSE-CAPILLARY: 212 mg/dL — AB (ref 65–99)
GLUCOSE-CAPILLARY: 116 mg/dL — AB (ref 65–99)
Glucose-Capillary: 235 mg/dL — ABNORMAL HIGH (ref 65–99)
Glucose-Capillary: 147 mg/dL — ABNORMAL HIGH (ref 65–99)

## 2018-03-12 LAB — CBC WITH DIFFERENTIAL/PLATELET
Basophils Absolute: 0 10*3/uL (ref 0.0–0.1)
Basophils Relative: 0 %
Eosinophils Absolute: 0.3 10*3/uL (ref 0.0–0.7)
Eosinophils Relative: 3 %
HEMATOCRIT: 25.1 % — AB (ref 36.0–46.0)
HEMOGLOBIN: 8 g/dL — AB (ref 12.0–15.0)
LYMPHS PCT: 33 %
Lymphs Abs: 2.9 10*3/uL (ref 0.7–4.0)
MCH: 28.5 pg (ref 26.0–34.0)
MCHC: 31.9 g/dL (ref 30.0–36.0)
MCV: 89.3 fL (ref 78.0–100.0)
MONO ABS: 0.6 10*3/uL (ref 0.1–1.0)
Monocytes Relative: 7 %
NEUTROS ABS: 5 10*3/uL (ref 1.7–7.7)
NEUTROS PCT: 57 %
Platelets: 387 10*3/uL (ref 150–400)
RBC: 2.81 MIL/uL — AB (ref 3.87–5.11)
RDW: 13.2 % (ref 11.5–15.5)
WBC: 8.7 10*3/uL (ref 4.0–10.5)

## 2018-03-12 MED ORDER — HYDROCODONE-ACETAMINOPHEN 7.5-325 MG PO TABS
1.0000 | ORAL_TABLET | Freq: Three times a day (TID) | ORAL | Status: DC
  Administered 2018-03-12 (×2): 1 via ORAL
  Filled 2018-03-12 (×2): qty 1

## 2018-03-12 NOTE — Progress Notes (Signed)
Pete Merten is a 42 y.o. female 03/03/1976 [id number]  Subjective: Concerned about increase in stump pain and not getting pain med in timely manner - also new blister and increased warmth and bruising, ?infection in stump  Objective: Vital signs in last 24 hours: Temp:  [98.2 F (36.8 C)-98.7 F (37.1 C)] 98.7 F (37.1 C) (04/07 0700) Pulse Rate:  [86-100] 100 (04/07 0700) Resp:  [17-18] 18 (04/07 0700) BP: (123-142)/(66-72) 142/72 (04/07 0700) SpO2:  [92 %-98 %] 92 % (04/07 0700) Weight change:  Last BM Date: 03/11/18  Intake/Output from previous day: 04/06 0701 - 04/07 0700 In: 702 [P.O.:702] Out: -   Physical Exam General:   Uncomfortable, in pain. Mom, spouse and dtr also in room. Lungs: Normal effort. Lungs clear to auscultation, no crackles or wheezes. Cardiovascular: Regular rate and rhythm, no edema Musculoskeletal:  L BKA Wounds: BKA stump with mild bruising and mild warmth to touch. Scant blood expressed central incision toward medial side and VERY tender to palpation. Tiny serous blister at medial side of stump, distal to inscioson - ?related to prior VAC? Sutures are clean, dry, intact. No signs of infection.  Lab Results: BMET    Component Value Date/Time   NA 137 03/10/2018 0709   NA 139 07/28/2017 1557   K 3.3 (L) 03/10/2018 0709   CL 101 03/10/2018 0709   CO2 27 03/10/2018 0709   GLUCOSE 181 (H) 03/10/2018 0709   BUN 11 03/10/2018 0709   BUN 16 07/28/2017 1557   CREATININE 0.63 03/10/2018 0709   CREATININE 0.51 08/05/2015 1114   CALCIUM 8.4 (L) 03/10/2018 0709   GFRNONAA >60 03/10/2018 0709   GFRAA >60 03/10/2018 0709   CBC    Component Value Date/Time   WBC 10.3 03/10/2018 0709   RBC 2.86 (L) 03/10/2018 0709   HGB 8.0 (L) 03/10/2018 0709   HGB 13.1 07/28/2017 1557   HCT 25.3 (L) 03/10/2018 0709   HCT 40.4 07/28/2017 1557   PLT 359 03/10/2018 0709   PLT 335 07/28/2017 1557   MCV 88.5 03/10/2018 0709   MCV 93 07/28/2017 1557   MCH 28.0  03/10/2018 0709   MCHC 31.6 03/10/2018 0709   RDW 13.2 03/10/2018 0709   RDW 13.4 07/28/2017 1557   LYMPHSABS 3.6 03/10/2018 0709   MONOABS 1.0 03/10/2018 0709   EOSABS 0.3 03/10/2018 0709   BASOSABS 0.0 03/10/2018 0709   CBG's (last 3):   Recent Labs    03/11/18 1758 03/11/18 2115 03/12/18 0543  GLUCAP 174* 161* 212*   LFT's Lab Results  Component Value Date   ALT 10 (L) 03/07/2018   AST 10 (L) 03/07/2018   ALKPHOS 105 03/07/2018   BILITOT 0.4 03/07/2018    Studies/Results: No results found.  Medications:  I have reviewed the patient's current medications. Scheduled Medications: . atorvastatin  20 mg Oral q1800  . carvedilol  3.125 mg Oral BID WC  . enoxaparin (LOVENOX) injection  0.5 mg/kg Subcutaneous Q24H  . gabapentin  1,200 mg Oral TID  . insulin aspart  0-20 Units Subcutaneous TID WC  . insulin aspart  6 Units Subcutaneous TID WC  . insulin glargine  50 Units Subcutaneous q morning - 10a  . mometasone-formoterol  2 puff Inhalation BID  . nitroGLYCERIN  0.2 mg Transdermal Daily  . pantoprazole  40 mg Oral Daily  . polyethylene glycol  17 g Oral BID  . potassium chloride  10 mEq Oral Daily  . 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 Call ortho Sharol Given) for re-eval if increasing WBC. See orders  Mateo Flow A. Asa Lente, MD 03/12/2018, 10:20 AM

## 2018-03-12 NOTE — Progress Notes (Signed)
Pt has home machine and can place on self.  Instructed to notify RT if any assistance is needed.

## 2018-03-12 NOTE — Progress Notes (Signed)
Occupational Therapy Session Note  Patient Details  Name: [name] MRN: [id number] Date of Birth: 07-09-1976  Today's Date: 03/12/2018 OT Individual Time: 1106-1200 OT Individual Time Calculation (min): 54 min    Short Term Goals: Week 1:  OT Short Term Goal 1 (Week 1): STGs equal to LTGs based on ELOS  Skilled Therapeutic Interventions/Progress Updates:    1:1. Pain in residual limb, RN aware. Pt agreeable to bathing at shower level. Pt completes stand pivot transfers or ambulatory transfers throughout session with RW with VC for hand placement/reach back prior to sitting. Pt bathes seated on TTB in shower to bathe all body parts using LHSS to wash foot and back. Pt transitioned to bathing peri area and buttocks at bed level. Pt dons clothing with set up to obtain items and AE. RN dresses residual limb and OT dons shrinker sock. Exited session with tp seated EOB with lunch set up.  Therapy Documentation Precautions:  Precautions Precautions: Fall Restrictions Weight Bearing Restrictions: Yes LLE Weight Bearing: Non weight bearing  See Function Navigator for Current Functional Status.   Therapy/Group: Individual Therapy  Tonny Branch 03/12/2018, 12:04 PM

## 2018-03-12 NOTE — Progress Notes (Signed)
Physical Therapy Session Note  Patient Details  Name: [name] MRN: [id number] Date of Birth: Oct 06, 1976  Today's Date: 03/12/2018 PT Individual Time: 1445-1600 PT Individual Time Calculation (min): 75 min  and Today's Date: 03/12/2018 PT Missed Time: 60 Minutes Missed Time Reason: Pain;Patient unwilling to participate  Short Term Goals: Week 1:  PT Short Term Goal 1 (Week 1): =LTG due to estimated LOS  Skilled Therapeutic Interventions/Progress Updates:   Session 1:  Pt refused morning session 2/2 pain, pt tearful and rates pain 10/10.   Session 2:  Returned for 2nd scheduled session in afternoon, pt agreeable to session and states she feels better. Pt transferred to EOB w/ supervision w/ use of bedrails and trapeze. Transferred to w/c via stand pivot w/ min guard. Session focused on amputee education, endurance, and global strengthening. Pt self-propelled w/c around unit w/ supervision using BUEs for aerobic conditioning, provided w/ w/c gloves for increased tolerance to use of w/c. Intermittent rest breaks 2/2 fatigue. Discussed residual limb care and desensitization techniques including use of various textures and pressures. Educated pt on wrapping technique using ace bandage while discussing nature of edema after amputation and wrapping techniques for both shaping and edema management. Instructed to wrap above knee to help avoid creasing at knee bend. Also educated on height of amputee pad, lowered it some for tolerance and comfort but knee was still at 0 deg extension. Emphasized getting knee into extension instead of hip at 90 deg as this is a more comfortable position for her to tolerate long term at this time. Pt verbalized understanding of reasoning behind keeping knee straight. Returned to room and assisted w/ toilet transfer. Ended session in w/c, call bell within reach and all needs met.   Therapy Documentation Precautions:  Precautions Precautions: Fall Restrictions Weight  Bearing Restrictions: Yes LLE Weight Bearing: Non weight bearing Vital Signs: Therapy Vitals Temp: 98.6 F (37 C) Temp Source: Oral Pulse Rate: 91 Resp: 18 BP: 131/74 Patient Position (if appropriate): Lying Oxygen Therapy SpO2: 96 % O2 Device: Room Air  See Function Navigator for Current Functional Status.   Therapy/Group: Individual Therapy  Ritik Stavola K Arnette 03/12/2018, 4:23 PM

## 2018-03-13 ENCOUNTER — Inpatient Hospital Stay (HOSPITAL_COMMUNITY): Payer: Self-pay | Admitting: Occupational Therapy

## 2018-03-13 ENCOUNTER — Inpatient Hospital Stay (HOSPITAL_COMMUNITY): Payer: Medicaid Other | Admitting: Occupational Therapy

## 2018-03-13 ENCOUNTER — Ambulatory Visit (INDEPENDENT_AMBULATORY_CARE_PROVIDER_SITE_OTHER): Payer: Medicaid Other | Admitting: Orthopedic Surgery

## 2018-03-13 ENCOUNTER — Inpatient Hospital Stay (HOSPITAL_COMMUNITY): Payer: Self-pay | Admitting: Physical Therapy

## 2018-03-13 LAB — CBC
HCT: 27.6 % — ABNORMAL LOW (ref 36.0–46.0)
Hemoglobin: 8.6 g/dL — ABNORMAL LOW (ref 12.0–15.0)
MCH: 27.7 pg (ref 26.0–34.0)
MCHC: 31.2 g/dL (ref 30.0–36.0)
MCV: 88.7 fL (ref 78.0–100.0)
Platelets: 444 K/uL — ABNORMAL HIGH (ref 150–400)
RBC: 3.11 MIL/uL — ABNORMAL LOW (ref 3.87–5.11)
RDW: 13.1 % (ref 11.5–15.5)
WBC: 10.3 K/uL (ref 4.0–10.5)

## 2018-03-13 LAB — GLUCOSE, CAPILLARY
GLUCOSE-CAPILLARY: 187 mg/dL — AB (ref 65–99)
Glucose-Capillary: 194 mg/dL — ABNORMAL HIGH (ref 65–99)
Glucose-Capillary: 122 mg/dL — ABNORMAL HIGH (ref 65–99)
Glucose-Capillary: 174 mg/dL — ABNORMAL HIGH (ref 65–99)

## 2018-03-13 MED ORDER — OXYCODONE HCL 5 MG PO TABS
10.0000 mg | ORAL_TABLET | Freq: Four times a day (QID) | ORAL | Status: DC | PRN
  Administered 2018-03-13 – 2018-03-14 (×3): 10 mg via ORAL
  Filled 2018-03-13 (×3): qty 2

## 2018-03-13 NOTE — Progress Notes (Signed)
Occupational Therapy Session Note  Patient Details  Name: [name] MRN: [id number] Date of Birth: 1976-11-29  Today's Date: 03/13/2018 OT Individual Time: 1000-1030 OT Individual Time Calculation (min): 30 min  and Today's Date: 03/13/2018 OT Missed Time: 30 Minutes Missed Time Reason: Patient fatigue   Short Term Goals: Week 1:  OT Short Term Goal 1 (Week 1): STGs equal to LTGs based on ELOS      Skilled Therapeutic Interventions/Progress Updates:    Pt received in bed with C pap mask on sleeping. Pt did awaken easily but stated she did not sleep all night long and could not do therapy now. Pt agreeable to resting for 30 min and then doing 30 min of therapy.   Therapist returned to room at 10 am. Pt was able to wake up at this point.  She opted to bathe from sitting on EOB she did not want to bathe R foot.  Set up with bathing and then pt transferred to [facility] with steadying A to toilet. She attempted self cleansing with lateral lean and was able to do so partially. She then stood with RW with S for therapist to fully cleanse her buttocks. Pt then donned pants with S using sit to stand with RW and transferred back to bed to rest before her next session.  Pt in room with all needs met.    Therapy Documentation Precautions:  Precautions Precautions: Fall Restrictions Weight Bearing Restrictions: Yes LLE Weight Bearing: Non weight bearing    Vital Signs: Therapy Vitals Temp: 97.9 F (36.6 C) Temp Source: Oral Pulse Rate: 86 Resp: 18 BP: 114/63 Patient Position (if appropriate): Lying Oxygen Therapy SpO2: 93 % O2 Device: Room Air   Pain:   ADL:   See Function Navigator for Current Functional Status.   Therapy/Group: Individual Therapy  [place] 03/13/2018, 9:01 AM

## 2018-03-13 NOTE — Progress Notes (Signed)
[facility] PHYSICAL MEDICINE & REHABILITATION     PROGRESS NOTE  Subjective/Complaints:   Pt c/o increased stump pain since wound vac d/ced 5 d ago changed from oxy IR 10mg  to 5mg .  Hydrocodone added per call coverage 4/7 ROS:  Denies CP, SOB, N/V/D.  Objective: Vital Signs: Blood pressure 114/63, pulse 86, temperature 97.9 F (36.6 C), temperature source Oral, resp. rate 18, height 5\' 2"  (1.575 m), weight (!) 146.7 kg (323 lb 6.6 oz), last menstrual period 02/17/2018, SpO2 93 %. No results found. Recent Labs    03/12/18 1034  WBC 8.7  HGB 8.0*  HCT 25.1*  PLT 387   No results for input(s): NA, K, CL, GLUCOSE, BUN, CREATININE, CALCIUM in the last 72 hours.  Invalid input(s): CO CBG (last 3)  Recent Labs    03/12/18 1652 03/12/18 2039 03/13/18 0645  GLUCAP 116* 147* 187*    Wt Readings from Last 3 Encounters:  03/06/18 (!) 146.7 kg (323 lb 6.6 oz)  03/03/18 (!) 146.1 kg (322 lb)  02/23/18 (!) 146.1 kg (322 lb)    Physical Exam:  BP 114/63 (BP Location: Left Arm)   Pulse 86   Temp 97.9 F (36.6 C) (Oral)   Resp 18   Ht 5\' 2"  (1.575 m)   Wt (!) 146.7 kg (323 lb 6.6 oz)   LMP 02/17/2018   SpO2 93%   BMI 59.15 kg/m  Constitutional: She appears well-developed. Obese. + distressed HENT: Normocephalic and atraumatic.  Eyes: EOM are normal. No discharge.  Cardiovascular: RRR and no JVD. Respiratory: Breath sounds normal. clear. GI: Bowel sounds are normal. She exhibits no distension.  Musculoskeletal: Left stump extremely TTP  Neurological: She is alert.  Motor:  4/5 Left hip flexion (unchanged) Skin: +VAC left stump  Psychiatric: She has a normal mood and affect. Her behavior is normal.   Assessment/Plan: 1. Functional deficits secondary to left BKA which require 3+ hours per day of interdisciplinary therapy in a comprehensive inpatient rehab setting. Physiatrist is providing close team supervision and 24 hour management of active medical problems listed  below. Physiatrist and rehab team continue to assess barriers to discharge/monitor patient progress toward functional and medical goals.  Function:  Bathing Bathing position   Position: Sitting EOB  Bathing parts Body parts bathed by patient: Right arm, Left arm, Chest, Abdomen, Front perineal area, Right upper leg, Left upper leg Body parts bathed by helper: Buttocks, Back  Bathing assist Assist Level: (minimal assist, 78%)      Upper Body Dressing/Undressing Upper body dressing   What is the patient wearing?: Pull over shirt/dress, Bra Bra - Perfomed by patient: Thread/unthread right bra strap, Thread/unthread left bra strap, Hook/unhook bra (pull down sports bra)   Pull over shirt/dress - Perfomed by patient: Thread/unthread right sleeve, Thread/unthread left sleeve, Put head through opening, Pull shirt over trunk          Upper body assist Assist Level: Set up      Lower Body Dressing/Undressing Lower body dressing   What is the patient wearing?: Pants, Underwear Underwear - Performed by patient: Thread/unthread right underwear leg, Thread/unthread left underwear leg, Pull underwear up/down Underwear - Performed by helper: Pull underwear up/down Pants- Performed by patient: Thread/unthread right pants leg, Thread/unthread left pants leg, Pull pants up/down Pants- Performed by helper: Pull pants up/down Non-skid slipper socks- Performed by patient: Don/doff right sock(sockaide) Non-skid slipper socks- Performed by helper: Don/doff right sock     Shoes - Performed by patient: Don/doff right  shoe(left shoe NA)            Lower body assist Assist for lower body dressing: Touching or steadying assistance (Pt > 75%)      Toileting Toileting   Toileting steps completed by patient: Adjust clothing prior to toileting, Adjust clothing after toileting Toileting steps completed by helper: Performs perineal hygiene Toileting Assistive Devices: (walker)  Toileting assist  Assist level: Touching or steadying assistance (Pt.75%)   Transfers Chair/bed transfer   Chair/bed transfer method: Stand pivot Chair/bed transfer assist level: Touching or steadying assistance (Pt > 75%) Chair/bed transfer assistive device: Armrests, Medical sales representative     Max distance: 10 Assist level: Touching or steadying assistance (Pt > 75%)   Wheelchair   Type: Manual Max wheelchair distance: 150' Assist Level: Supervision or verbal cues  Cognition Comprehension Comprehension assist level: Follows complex conversation/direction with no assist  Expression Expression assist level: Expresses complex ideas: With no assist  Social Interaction Social Interaction assist level: Interacts appropriately with others - No medications needed.  Problem Solving Problem solving assist level: Solves complex problems: Recognizes & self-corrects  Memory Memory assist level: Complete Independence: No helper    Medical Problem List and Plan: 1.  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    Continue CIR PT, OT,  2.  DVT Prophylaxis/Anticoagulation: SCD right lower extremity.    Lovenox started on 4/2 3. Pain Management/chronic pain syndrome: Neurontin 1200 mg 3 times daily, and Robaxin as needed   Oxycodone increased from 5mg  to 10mg  q6, d/c Hydrocodone 4. Mood/bipolar disorder: Provide emotional support 5. Neuropsych: This patient is capable of making decisions on her own behalf. 6. Skin/Wound Care: Routine skin checks 7. Fluids/Electrolytes/Nutrition: Routine I&O's  8.  Diabetes mellitus with peripheral neuropathy.  Latest hemoglobin A1c 13.8.     NovoLog 6 units 3 times daily   Lantus insulin 42 units every morning, increased to 50 on 4/3   Victoza 1.8 mg every morning.     Check blood sugars before meals and at bedtime.  Diabetic teaching    Vitals:   03/12/18 1353 03/13/18 0647  BP:  131/74 114/63  Pulse: 91 86  Resp: 18 18  Temp: 98.6 F (37 C) 97.9 F (36.6 C)  SpO2: 96% 93%  controlled 4/8   9.  COPD.  CPAP. 10.  Morbid obesity.  Dietary follow-up 11.  Hypertension.  Coreg 3.125 mg twice daily.  Check blood sugars before meals and at bedtime   Relatively controlled on 4/4 12.  Hyperlipidemia.  Lipitor 13.  Constipation.  Laxative assistance, senna 2 qhs 14. Leukocytosis   resolved 15. ABLA   Hb 8.6 on 4/2   Labs ordered for tomorrow   Cont to monitor  16.  Hypokalemia- KCL supplement  LOS (Days) 7 A FACE TO FACE EVALUATION WAS PERFORMED  Charlett Blake 03/13/2018 7:11 AM

## 2018-03-13 NOTE — Progress Notes (Signed)
Physical Therapy Session Note  Patient Details  Name: [name] MRN: [id number] Date of Birth: [birth year]  Today's Date: 03/13/2018 PT Individual Time: 1100-1200 PT Individual Time Calculation (min): 60 min   Short Term Goals: Week 1:  PT Short Term Goal 1 (Week 1): =LTG due to estimated LOS  Skilled Therapeutic Interventions/Progress Updates: Pt received seated on EOB, denies pain at rest but does report occasional pain when moving residual limb. Dons limbguard with minA; educated on importance of use to maintain LE ROM and protect limb during mobility. Pt reports significant pain with staff donning shrinker sock and reports she has not donned/doffed the shrinker herself yet; therapist contacted orthotist to discuss use of donning ring to increase ease and reduce pain associated with donning. Discussed with pt/CSW plan to switch w/c for smaller width to allow w/c access in the home as 22" seat width is accessible through doorways according to provided home measurements. Pt's mother to bring up chair today to switch. Pt propels w/c throughout unit with S for strengthening and endurance. Gait with RW x5 trials at 10-15' each with min guard; limited distance due to fatigue. Min cues for technique to reduce impact of RLE on floor when using RW. Pt reports she had a very good experience with peer support visitor over the weekend; reports she is excited to attend support group when she goes home and feels overall as though she is coping with limb loss better than she expected. States she is encouraged by seeing the things she is still able to do independently. Remained seated in w/c at end of session, all needs in reach.      Therapy Documentation Precautions:  Precautions Precautions: Fall Restrictions Weight Bearing Restrictions: Yes LLE Weight Bearing: Non weight bearing   See Function Navigator for Current Functional Status.   Therapy/Group: Individual Therapy  Luberta Mutter 03/13/2018, 12:12 PM

## 2018-03-13 NOTE — Progress Notes (Signed)
Pt had much better pain control overnight with the addition of scheduled Norco

## 2018-03-13 NOTE — Progress Notes (Signed)
Occupational Therapy Session Note  Patient Details  Name: [name] MRN: [id number] Date of Birth: [birth year]  Today's Date: 03/13/2018 OT Individual Time: 1345-1430 OT Individual Time Calculation (min): 45 min    Short Term Goals: Week 1:  OT Short Term Goal 1 (Week 1): STGs equal to LTGs based on ELOS  Skilled Therapeutic Interventions/Progress Updates:    Upon entering the room, pt supine in bed sleeping with no c/o pain. Pt agreeable to OT intervention with encouragement. Pt performed stand pivot transfer with steady assistance and use of RW. Pt requesting to address kitchen mobility with RW. Pt propelled self with B UEs and increased time to kitchen. OT demonstrated and educated pt on kitchen set up for safety and functional use. Pt verbalized understanding and reports already performing some tasks this way prior to surgery. Pt standing with RW to obtain items from pantry and refrigerator. Pt verbalizing several times throughout session that she will have help from daughter with these tasks at home. Pt transferred onto low , soft sofa with min and required max lifting assistance to stand from low surface. Pt becoming very anxious prior to even attempting transfer. Pt returning to wheelchair and propelled self back to room. Pt remained in wheelchair with call bell and all needed items within reach upon exiting the session.   Therapy Documentation Precautions:  Precautions Precautions: Fall Restrictions Weight Bearing Restrictions: Yes LLE Weight Bearing: Non weight bearing  See Function Navigator for Current Functional Status.   Therapy/Group: Individual Therapy  Gypsy Decant 03/13/2018, 4:30 PM

## 2018-03-13 NOTE — Progress Notes (Signed)
Patient has home CPAP unit at bedside. Patient places self on and off when ready. 

## 2018-03-13 NOTE — Progress Notes (Signed)
Occupational Therapy Session Note  Patient Details  Name: Kynadee Dam MRN: [id number] Date of Birth: [birth year]  Today's Date: 03/13/2018 OT Individual Time: 7622-6333 OT Individual Time Calculation (min): 32 min   Short Term Goals: Week 1:  OT Short Term Goal 1 (Week 1): STGs equal to LTGs based on ELOS  Skilled Therapeutic Interventions/Progress Updates:    Pt greeted in w/c. Ready to go. Tx focus on endurance and UE strengthening. She self propelled to dayroom and was instructed on B UE stretches/exercises using 3# bar x10-15 reps. Pt reported particularly liking yoga-based stretches and planned to use them while up in w/c outside of therapies. Pt then self propelled back to room and transferred with RW and supervision back to bed. Left her supine in bed with all needs within reach.    Therapy Documentation Precautions:  Precautions Precautions: Fall Restrictions Weight Bearing Restrictions: Yes LLE Weight Bearing: Non weight bearing Pain: Pain Assessment Pain Score: 5  ADL:       See Function Navigator for Current Functional Status.   Therapy/Group: Individual Therapy  Kavaughn Faucett A Xzavion Doswell 03/13/2018, 3:59 PM

## 2018-03-14 ENCOUNTER — Inpatient Hospital Stay (HOSPITAL_COMMUNITY): Payer: Self-pay

## 2018-03-14 ENCOUNTER — Inpatient Hospital Stay (HOSPITAL_COMMUNITY): Payer: Self-pay | Admitting: Occupational Therapy

## 2018-03-14 ENCOUNTER — Inpatient Hospital Stay (HOSPITAL_COMMUNITY): Payer: Self-pay | Admitting: Physical Therapy

## 2018-03-14 DIAGNOSIS — R0989 Other specified symptoms and signs involving the circulatory and respiratory systems: Secondary | ICD-10-CM

## 2018-03-14 DIAGNOSIS — E876 Hypokalemia: Secondary | ICD-10-CM

## 2018-03-14 LAB — GLUCOSE, CAPILLARY
GLUCOSE-CAPILLARY: 166 mg/dL — AB (ref 65–99)
GLUCOSE-CAPILLARY: 192 mg/dL — AB (ref 65–99)
Glucose-Capillary: 180 mg/dL — ABNORMAL HIGH (ref 65–99)
Glucose-Capillary: 180 mg/dL — ABNORMAL HIGH (ref 65–99)

## 2018-03-14 MED ORDER — INSULIN GLARGINE 100 UNIT/ML ~~LOC~~ SOLN: 58.0000 [IU] | Freq: Every morning | SUBCUTANEOUS | Status: DC

## 2018-03-14 MED ORDER — HYDROCODONE-ACETAMINOPHEN 5-325 MG PO TABS
1.0000 | ORAL_TABLET | Freq: Four times a day (QID) | ORAL | Status: DC | PRN
  Administered 2018-03-14 – 2018-03-16 (×6): 1 via ORAL
  Filled 2018-03-14 (×6): qty 1

## 2018-03-14 MED ORDER — INSULIN GLARGINE 100 UNIT/ML ~~LOC~~ SOLN
50.0000 [IU] | Freq: Every morning | SUBCUTANEOUS | Status: DC
  Administered 2018-03-15 – 2018-03-16 (×2): 50 [IU] via SUBCUTANEOUS
  Filled 2018-03-14 (×2): qty 0.5

## 2018-03-14 MED ORDER — INSULIN GLARGINE 100 UNIT/ML ~~LOC~~ SOLN
10.0000 [IU] | Freq: Every day | SUBCUTANEOUS | Status: DC
  Administered 2018-03-14 – 2018-03-15 (×2): 10 [IU] via SUBCUTANEOUS
  Filled 2018-03-14 (×2): qty 0.1

## 2018-03-14 NOTE — Progress Notes (Signed)
Physical Therapy Session Note  Patient Details  Name: [name] MRN: [id number] Date of Birth: 02-12-1976  Today's Date: 03/14/2018 PT Individual Time: 0830-0900 PT Individual Time Calculation (min): 30 min   Short Term Goals: Week 1:  PT Short Term Goal 1 (Week 1): =LTG due to estimated LOS  Skilled Therapeutic Interventions/Progress Updates:    Session focused on planning for family education and d/c planning (will plan to schedule for tomorrow as pt called husband to set it up) and introduced HEP and handout issued. Instructed in quad sets with 5 second hold x 15 reps, SLR x 15 reps, LAQ and SAQ x 15 reps with 3 second hold, sidelying hip abduction and extension x 15 reps for strengthening and ROM.   Therapy Documentation Precautions:  Precautions Precautions: Fall Restrictions Weight Bearing Restrictions: Yes LLE Weight Bearing: Non weight bearing   Pain:  No complaints.   See Function Navigator for Current Functional Status.   Therapy/Group: Individual Therapy  Canary Brim Ivory Broad, PT, DPT  03/14/2018, 9:02 AM

## 2018-03-14 NOTE — Progress Notes (Signed)
Occupational Therapy Session Note  Patient Details  Name: [name] MRN: [id number] Date of Birth: 12-03-1976  Today's Date: 03/14/2018 OT Individual Time: 1100-1200 OT Individual Time Calculation (min): 60 min   Short Term Goals: Week 1:  OT Short Term Goal 1 (Week 1): STGs equal to LTGs based on ELOS  Skilled Therapeutic Interventions/Progress Updates:    OT treatment session focused on tranfsers, UB there-ex, and community reintegration. Pt completed squat-pivot transfer bed> wc on R side with Min A. Pt propelled wc to the sink and brushed teeth at wc level with set-up A to obtain supplies. Pt donned wc gloves then self-propelled wc to elevators. Educated pt on safe community access at [employer] level including wc management in and out of elevators. UB there-ex using level 3 green thera-band 3 sets of 15 with rest breaks in between. WC propulsion back up to room with min cues for backing wc into elevator. Pt left seated in wc at end of session with call bell in reach and needs met.   Therapy Documentation Precautions:  Precautions Precautions: Fall Restrictions Weight Bearing Restrictions: Yes LLE Weight Bearing: Non weight bearing General:   See Function Navigator for Current Functional Status.   Therapy/Group: Individual Therapy  [name] 03/14/2018, 1:05 PM

## 2018-03-14 NOTE — Progress Notes (Signed)
Occupational Therapy Session Note  Patient Details  Name: Lachele Lievanos MRN: [id number] Date of Birth: 08-11-1976  Today's Date: 03/14/2018 OT Individual Time: 0930-1030 OT Individual Time Calculation (min): 60 min    Short Term Goals: Week 1:  OT Short Term Goal 1 (Week 1): STGs equal to LTGs based on ELOS  Skilled Therapeutic Interventions/Progress Updates:    Pt seen for ADL training with a focus on activity tolerance, balance, use of AE.  Pt received in bed and said she felt "woozy" so encouraged pt to move slowly.  She completed transfers stand pivot with RW bed to w/c to tub bench to w/c to bed.  She used lateral leans in shower to reach most of her bottom but was not able to get to all areas.  Pt stood with RW for therapist to assist her. Pt completed dressing from EOB.   She only requires set up / S with all of her transfers or self care except for fully cleansing bottom.  At end of session demonstrated a toilet aid to pt and she practiced using it in a simulated manner. Pt felt she would be able to use it well.  Provided pt with toilet aide and reviewed set up / clean up strategies.  Pt sitting on EOB with all needs met.  Therapy Documentation Precautions:  Precautions Precautions: Fall Restrictions Weight Bearing Restrictions: Yes LLE Weight Bearing: Non weight bearing   Pain: Pain Assessment Pain Score: 6  - L LE limb, premedicated   ADL:     See Function Navigator for Current Functional Status.   Therapy/Group: Individual Therapy  Artha Stavros 03/14/2018, 12:57 PM

## 2018-03-14 NOTE — Progress Notes (Signed)
Physical Therapy Session Note  Patient Details  Name: [name] MRN: [id number] Date of Birth: [birth year]  Today's Date: 03/14/2018 PT Individual Time: 1300-1400 PT Individual Time Calculation (min): 60 min   Short Term Goals: Week 1:  PT Short Term Goal 1 (Week 1): =LTG due to estimated LOS  Skilled Therapeutic Interventions/Progress Updates: Pt received seated in w/c, denies pain and agreeable to treatment. W/c propulsion 2x150' with BUE modI on level floors. Stand pivot transfer w/c <>mat table with RW and S. Discussed use of shrinker as opposed to ace wraps and benefits for reducing limb size, decreasing tourniquet-like effect, and consistent gradient pressure in shrinker; pt reports painful donning shrinker but willing to attempt putting on. Discussed with RN; small drainage medial R side at incision, gauze placed over incision line and shrinker donned on top with modA from therapist. Pt notes decreasing pain with donning shrinker and with touching residual limb. Educated pt in ongoing desensitization and training pressure tolerance in weight bearing areas of prosthetic. Sit <>stand x10 reps from mat table with S. 2x15 heel raises with RW for BUE support. Returned to w/c close S with RW stand pivot. Transfer w/c >bed set at 30" to simulate home environment; performed with S. Remained in bed at end of session, all needs in reach.      Therapy Documentation Precautions:  Precautions Precautions: Fall Restrictions Weight Bearing Restrictions: Yes LLE Weight Bearing: Non weight bearing   See Function Navigator for Current Functional Status.   Therapy/Group: Individual Therapy  Luberta Mutter 03/14/2018, 2:48 PM

## 2018-03-14 NOTE — Progress Notes (Signed)
[facility]     PROGRESS NOTE  Subjective/Complaints:  Pt seen in bed this AM.  She slept well overnight.  She states she feels well and denies pain initially.  Then she asks if her pain medications can be switched to what she was prescribed over the weekend.   ROS:  Denies CP, SOB, N/V/D.  Objective: Vital Signs: Blood pressure 128/64, pulse 99, temperature 98.1 F (36.7 C), temperature source Oral, resp. rate 19, height 5\' 2"  (1.575 m), weight (!) 146.7 kg (323 lb 6.6 oz), last menstrual period 02/17/2018, SpO2 97 %. No results found. Recent Labs    03/12/18 1034 03/13/18 0740  WBC 8.7 10.3  HGB 8.0* 8.6*  HCT 25.1* 27.6*  PLT 387 444*   No results for input(s): NA, K, CL, GLUCOSE, BUN, CREATININE, CALCIUM in the last 72 hours.  Invalid input(s): CO CBG (last 3)  Recent Labs    03/13/18 1647 03/13/18 2102 03/14/18 0708  GLUCAP 122* 174* 180*    Wt Readings from Last 3 Encounters:  03/06/18 (!) 146.7 kg (323 lb 6.6 oz)  03/03/18 (!) 146.1 kg (322 lb)  02/23/18 (!) 146.1 kg (322 lb)    Physical Exam:  BP 128/64 (BP Location: Left Arm)   Pulse 99   Temp 98.1 F (36.7 C) (Oral)   Resp 19   Ht 5\' 2"  (1.575 m)   Wt (!) 146.7 kg (323 lb 6.6 oz)   LMP 02/17/2018   SpO2 97%   BMI 59.15 kg/m  Constitutional: She appears well-developed. Obese. + distressed HENT: Normocephalic and atraumatic.  Eyes: EOM are normal. No discharge.  Cardiovascular: RRR and no JVD. Respiratory: Breath sounds normal. Clear. GI: Bowel sounds are normal. She exhibits no distension.  Musculoskeletal: Left stump extremely TTP  Neurological: She is alert.  Motor:  4/5 Left hip flexion (unchanged) Skin: BKA with mild medial serosanguinous drainage Psychiatric: She has a normal mood and affect. Her behavior is normal.   Assessment/Plan: 1. Functional deficits secondary to left BKA which require 3+ hours per day of interdisciplinary therapy in a  comprehensive inpatient rehab setting. Physiatrist is providing close team supervision and 24 hour management of active medical problems listed below. Physiatrist and rehab team continue to assess barriers to discharge/monitor patient progress toward functional and medical goals.  Function:  Bathing Bathing position   Position: Sitting EOB  Bathing parts Body parts bathed by patient: Right arm, Left arm, Chest, Abdomen, Front perineal area, Right upper leg, Left upper leg Body parts bathed by helper: Back, Buttocks  Bathing assist Assist Level: Supervision or verbal cues      Upper Body Dressing/Undressing Upper body dressing   What is the patient wearing?: Pull over shirt/dress, Bra Bra - Perfomed by patient: Thread/unthread right bra strap, Thread/unthread left bra strap, Hook/unhook bra (pull down sports bra)   Pull over shirt/dress - Perfomed by patient: Thread/unthread right sleeve, Thread/unthread left sleeve, Put head through opening, Pull shirt over trunk          Upper body assist Assist Level: Set up      Lower Body Dressing/Undressing Lower body dressing   What is the patient wearing?: Pants Underwear - Performed by patient: Thread/unthread right underwear leg, Thread/unthread left underwear leg, Pull underwear up/down Underwear - Performed by helper: Pull underwear up/down Pants- Performed by patient: Thread/unthread right pants leg, Thread/unthread left pants leg, Pull pants up/down Pants- Performed by helper: Pull pants up/down Non-skid slipper socks- Performed by patient:  Don/doff right sock(sockaide) Non-skid slipper socks- Performed by helper: Don/doff right sock     Shoes - Performed by patient: Don/doff right shoe(left shoe NA)            Lower body assist Assist for lower body dressing: Supervision or verbal cues      Toileting Toileting   Toileting steps completed by patient: Adjust clothing prior to toileting, Adjust clothing after  toileting Toileting steps completed by helper: Performs perineal hygiene Toileting Assistive Devices: (walker)  Toileting assist Assist level: Touching or steadying assistance (Pt.75%)   Transfers Chair/bed transfer   Chair/bed transfer method: Stand pivot Chair/bed transfer assist level: Touching or steadying assistance (Pt > 75%) Chair/bed transfer assistive device: Armrests, Medical sales representative     Max distance: 15 Assist level: Touching or steadying assistance (Pt > 75%)   Wheelchair   Type: Manual Max wheelchair distance: 150' Assist Level: Supervision or verbal cues  Cognition Comprehension Comprehension assist level: Follows complex conversation/direction with no assist  Expression Expression assist level: Expresses complex ideas: With no assist  Social Interaction Social Interaction assist level: Interacts appropriately with others - No medications needed.  Problem Solving Problem solving assist level: Solves complex problems: Recognizes & self-corrects  Memory Memory assist level: Complete Independence: No helper    Medical Problem List and Plan: 1.  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    Continue CIR  2.  DVT Prophylaxis/Anticoagulation: SCD right lower extremity.    Lovenox started on 4/2 3. Pain Management/chronic pain syndrome: Neurontin 1200 mg 3 times daily, and Robaxin as needed   Oxycodone changed to Hydrocodone on 4/9 4. Mood/bipolar disorder: Provide emotional support 5. Neuropsych: This patient is capable of making decisions on her own behalf. 6. Skin/Wound Care: Routine skin checks 7. Fluids/Electrolytes/Nutrition: Routine I&O's  8.  Diabetes mellitus with peripheral neuropathy.  Latest hemoglobin A1c 13.8.     NovoLog 6 units 3 times daily   Lantus insulin 42 units every morning, increased to 50 on 4/3   Lantus 10 qhs started on 4/9   Victoza 1.8 mg  every morning.     Check blood sugars before meals and at bedtime.  Diabetic teaching    Vitals:   03/13/18 2200 03/14/18 0126  BP: (!) 154/77 128/64  Pulse: (!) 102 99  Resp:  19  Temp:  98.1 F (36.7 C)  SpO2: 98% 97%   9.  COPD.  CPAP. 10.  Morbid obesity.  Dietary follow-up 11.  Hypertension.  Coreg 3.125 mg twice daily.  Check blood sugars before meals and at bedtime   Labile on 4/9 12.  Hyperlipidemia.  Lipitor 13.  Constipation.  Laxative assistance, senna 2 qhs 14. Leukocytosis   resolved 15. ABLA   Hb 8.6 on 4/8   Cont to monitor 16.  Hypokalemia:   K+ 3.3 on 4/5   KCL supplement   LOS (Days) 8 A FACE TO FACE EVALUATION WAS PERFORMED  Kyria Bumgardner Lorie Phenix 03/14/2018 8:12 AM

## 2018-03-14 NOTE — Progress Notes (Signed)
Patient has home CPAP unit at bedside. Patient places self on and off when ready. 

## 2018-03-15 ENCOUNTER — Inpatient Hospital Stay (HOSPITAL_COMMUNITY): Payer: Medicaid Other | Admitting: *Deleted

## 2018-03-15 ENCOUNTER — Inpatient Hospital Stay (HOSPITAL_COMMUNITY): Payer: Self-pay | Admitting: Physical Therapy

## 2018-03-15 ENCOUNTER — Encounter (HOSPITAL_COMMUNITY): Payer: Self-pay | Admitting: Occupational Therapy

## 2018-03-15 ENCOUNTER — Ambulatory Visit (HOSPITAL_COMMUNITY): Payer: Self-pay | Admitting: Physical Therapy

## 2018-03-15 ENCOUNTER — Inpatient Hospital Stay (HOSPITAL_COMMUNITY): Payer: Medicaid Other | Admitting: Occupational Therapy

## 2018-03-15 LAB — GLUCOSE, CAPILLARY
GLUCOSE-CAPILLARY: 183 mg/dL — AB (ref 65–99)
Glucose-Capillary: 195 mg/dL — ABNORMAL HIGH (ref 65–99)
Glucose-Capillary: 165 mg/dL — ABNORMAL HIGH (ref 65–99)
Glucose-Capillary: 169 mg/dL — ABNORMAL HIGH (ref 65–99)

## 2018-03-15 NOTE — Progress Notes (Signed)
Occupational Therapy Discharge Summary  Patient Details  Name: [name] MRN: [id number] Date of Birth: 1976-04-12  Today's Date: 03/15/2018 OT Individual Time: 1003-1100 OT Individual Time Calculation (min): 57 min      Patient has met 8 of 8 long term goals due to improved activity tolerance, improved balance and ability to compensate for deficits.  Patient to discharge at overall Modified Independent level.  Patient's care partner is independent to provide the necessary physical assistance at discharge.    Reasons goals not met: NA  Recommendation:  Patient will benefit from ongoing skilled OT services in home health setting to continue to advance functional skills in the area of BADL.  Pt is currently supervision to modified independent level for selfcare tasks and functional transfers.  She will be getting a smaller wheelchair for her house which will allow her to maneuver around from the wheelchair level, but will still need to use the walker for transfers into the bathroom.  Feel she will benefit from [employer] eval for safety and environmental setup.    Equipment: tub bench  Reasons for discharge: treatment goals met and discharge from hospital  Patient/family agrees with progress made and goals achieved: Yes  OT Discharge Precautions/Restrictions  Precautions Precautions: Fall Required Braces or Orthoses: Other Brace/Splint Other Brace/Splint: limbguard Restrictions Weight Bearing Restrictions: Yes LLE Weight Bearing: Non weight bearing   Pain Pain Assessment Pain Scale: 0-10 Faces Pain Scale: Hurts a little bit Pain Type: Phantom pain;Surgical pain Pain Location: Leg Pain Orientation: Left Pain Descriptors / Indicators: Discomfort Pain Onset: With Activity Pain Intervention(s): Repositioned ADL  See Function Section of chart for details  Vision Baseline Vision/History: Wears glasses Wears Glasses: At all times Patient Visual Report: No change from  baseline Vision Assessment?: No apparent visual deficits Perception  Perception: Within Functional Limits Praxis Praxis: Intact Cognition Overall Cognitive Status: Within Functional Limits for tasks assessed Arousal/Alertness: Awake/alert Orientation Level: Oriented X4 Attention: Sustained;Focused Focused Attention: Appears intact Sustained Attention: Appears intact Memory: Appears intact Awareness: Appears intact Problem Solving: Appears intact Safety/Judgment: Appears intact Sensation Sensation Light Touch: Appears Intact Stereognosis: Appears Intact Hot/Cold: Appears Intact Proprioception: Appears Intact Additional Comments: Sensation intact in BUEs and hands Coordination Gross Motor Movements are Fluid and Coordinated: Yes Fine Motor Movements are Fluid and Coordinated: Yes Heel Shin Test: NT d/t amputation Motor  Motor Motor: Within Functional Limits Mobility  Bed Mobility Bed Mobility: Sit to Supine;Supine to Sit Supine to Sit: 6: Modified independent (Device/Increase time) Sit to Supine: 6: Modified independent (Device/Increase time) Transfers Transfers: Sit to Stand;Stand to Sit Sit to Stand: 6: Modified independent (Device/Increase time);With upper extremity assist Stand to Sit: 6: Modified independent (Device/Increase time);With armrests;With upper extremity assist  Trunk/Postural Assessment  Cervical Assessment Cervical Assessment: Within Functional Limits Thoracic Assessment Thoracic Assessment: Within Functional Limits Lumbar Assessment Lumbar Assessment: Within Functional Limits Postural Control Postural Control: Within Functional Limits  Balance Balance Balance Assessed: Yes Static Sitting Balance Static Sitting - Balance Support: Feet supported Static Sitting - Level of Assistance: 6: Modified independent (Device/Increase time) Dynamic Sitting Balance Dynamic Sitting - Balance Support: During functional activity Dynamic Sitting - Level of  Assistance: 6: Modified independent (Device/Increase time) Static Standing Balance Static Standing - Balance Support: During functional activity Static Standing - Level of Assistance: 6: Modified independent (Device/Increase time) Dynamic Standing Balance Dynamic Standing - Balance Support: During functional activity;Bilateral upper extremity supported Dynamic Standing - Level of Assistance: 5: Stand by assistance Extremity/Trunk Assessment RUE Assessment RUE Assessment: Within Functional  Limits LUE Assessment LUE Assessment: Within Functional Limits   See Function Navigator for Current Functional Status.  Katrina Brosh OTR/L 03/15/2018, 4:07 PM

## 2018-03-15 NOTE — Progress Notes (Signed)
Occupational Therapy Session Note  Patient Details  Name: [name] MRN: [id number] Date of Birth: 1976/08/09  Today's Date: 03/15/2018 OT Individual Time: 1003-1100 OT Individual Time Calculation (min): 57 min    Short Term Goals: Week 1:  OT Short Term Goal 1 (Week 1): STGs equal to LTGs based on ELOS  Skilled Therapeutic Interventions/Progress Updates:    Pt completed bathing and dressing during session with pt's spouse present for education.  He was able to provide supervision for shower transfer with pt hopping from the EOB to the shower bench with supervision using the RW.  She completed all bathing with supervision sit to stand and with use of the grab bar for support when standing and the LH sponge and reacher for washing and drying.  She performed dressing sit to stand at the EOB with AE and modified independence.  Grooming tasks of brushing teeth and combing hair performed from the wheelchair at modified independent level as well.  Finished session with pt in the wheelchair and spouse in the room.  All questions answered and pt's spouse able to assist with applying gauze and limb shrinker to the the LLE during session.  He also provided supervision during functional mobility.    Therapy Documentation Precautions:  Precautions Precautions: Fall Restrictions Weight Bearing Restrictions: Yes LLE Weight Bearing: Non weight bearing  Pain: Pain Assessment Pain Scale: Faces Faces Pain Scale: Hurts a little bit Pain Type: Phantom pain;Surgical pain Pain Location: Leg Pain Orientation: Left Pain Descriptors / Indicators: Discomfort Pain Onset: With Activity Pain Intervention(s): Repositioned ADL: See Function Navigator for Current Functional Status.   Therapy/Group: Individual Therapy  Ralph Brouwer OTR/L 03/15/2018, 12:21 PM

## 2018-03-15 NOTE — Progress Notes (Signed)
Physical Therapy Session Note  Patient Details  Name: [name] MRN: [id number] Date of Birth: 1976-08-02  Today's Date: 03/15/2018 PT Individual Time: 1300-1400 PT Individual Time Calculation (min): 60 min  and Today's Date: 03/15/2018 PT Concurrent Time: 1100-1200 PT Concurrent Time Calculation (min): 60 min  Short Term Goals: Week 1:  PT Short Term Goal 1 (Week 1): =LTG due to estimated LOS  Skilled Therapeutic Interventions/Progress Updates: Tx 1: Concurrent session; denies pain and agreeable to treatment. Husband present for hands on family training. Pt performed bed <>w/c at elevated seat height with RW and S. Performed car transfer with S stand pivot. Pt and husband change gauze pads and replace shrinker d/t observed drainage. During rest breaks, discussed community access via w/c. Reviewed recommendations for residual limb and sound limb care at d/c, including skin inspection, wearing shoe on intact limb for skin protection, use of shrinker and performing ROM/strengthening HEP to prepare for prosthetic fit/training. Performed gait in ADL apartment with S on carpeted surface, couch transfer with minA. W/c propulsion relay 3x150' with BUE for strengthening and aerobic endurance. Pt and husband with no further concerns or questions regarding d/c at this time. Remained seated in w/c at end of session, all needs in reach.   Tx 2: Pt received seated in w/c, denies pain and agreeable to treatment. Pt's mother present for session for education. Pt demo's stand pivot transfer w/c <>bed modI with RW. Nustep x8 min BUE/RLE x8 min level 5 with average 40 steps/min. Reviewed recommendations for BLE care/safety as earlier with husband. Performed exercises as below and provided pt with handouts for exercises and education as below.   Exercises  Prone Hip Extension with Residual Limb (BKA) - 15 reps - 3 sets - 1x daily - 7x weekly  Prone Knee Flexion - 10 reps - 3 sets - 1x daily - 7x weekly  Superman on  Table - 10 reps - 3 sets - 1x daily - 7x weekly  Oblique Bicycle Crunch - 10 reps - 3 sets - 1x daily - 7x weekly  Sidelying Hip Abduction (BKA) - 10 reps - 3 sets - 1x daily - 7x weekly  Single Leg Bridge - 10 reps - 3 sets - 1x daily - 7x weekly  Patient Education  Scar Massage After Amputation  Rubbing  Returned to room in w/c modI. Stand pivot transfer to bed modI. Remained supine in bed, all needs in reach.       Therapy Documentation Precautions:  Precautions Precautions: Fall Restrictions Weight Bearing Restrictions: Yes LLE Weight Bearing: Non weight bearing   See Function Navigator for Current Functional Status.   Therapy/Group: Individual Therapy and Concurrent  Luberta Mutter 03/15/2018, 12:20 PM

## 2018-03-15 NOTE — Progress Notes (Signed)
Physical Therapy Discharge Summary  Patient Details  Name: [name] MRN: [id number] Date of Birth: [birth year]  Today's Date: 03/15/2018   Patient has met 8 of 8 long term goals due to improved activity tolerance, improved balance, improved postural control, increased strength, increased range of motion and decreased pain.  Patient to discharge at a wheelchair level Modified Independent, recommend S for ambulation and car transfers.   Patient's care partner is independent to provide the necessary physical assistance at discharge.  Reasons goals not met: All goals met  Recommendation:  Patient will benefit from ongoing skilled PT services in home health setting to continue to advance safe functional mobility, address ongoing impairments in strength, ROM, activity tolerance, balance, and minimize fall risk. Unsure of insurance eligibility to qualify for follow up therapy; pt has been provided extensively detailed HEP and education regarding ongoing strengthening/ROM in preparation for prosthetic fitting/training.   Equipment: Exchanged w/c for correct size for pt and home access  Reasons for discharge: treatment goals met and discharge from hospital  Patient/family agrees with progress made and goals achieved: Yes  PT Discharge Precautions/Restrictions Precautions Precautions: Fall Required Braces or Orthoses: Other Brace/Splint Other Brace/Splint: limbguard Restrictions Weight Bearing Restrictions: Yes LLE Weight Bearing: Non weight bearing Pain Pain Assessment Pain Scale: 0-10 Faces Pain Scale: Hurts a little bit Pain Type: Phantom pain;Surgical pain Pain Location: Leg Pain Orientation: Left Pain Descriptors / Indicators: Discomfort Pain Onset: With Activity Pain Intervention(s): Repositioned Vision/Perception  Perception Perception: Within Functional Limits Praxis Praxis: Intact  Cognition Overall Cognitive Status: Within Functional Limits for tasks  assessed Arousal/Alertness: Awake/alert Orientation Level: Oriented X4 Safety/Judgment: Appears intact Sensation Sensation Light Touch: Appears Intact Stereognosis: Appears Intact Hot/Cold: Appears Intact Proprioception: Appears Intact Additional Comments: hypersensitivity distal residual LLE Coordination Gross Motor Movements are Fluid and Coordinated: Yes Fine Motor Movements are Fluid and Coordinated: Yes Heel Shin Test: NT d/t amputation Motor  Motor Motor: Within Functional Limits  Mobility Bed Mobility Bed Mobility: Sit to Supine;Supine to Sit Supine to Sit: 6: Modified independent (Device/Increase time) Sit to Supine: 6: Modified independent (Device/Increase time) Transfers Transfers: Yes Sit to Stand: 6: Modified independent (Device/Increase time);With upper extremity assist Stand to Sit: 6: Modified independent (Device/Increase time);With armrests;With upper extremity assist Stand Pivot Transfers: 6: Modified independent (Device/Increase time) Locomotion  Ambulation Ambulation: Yes Ambulation/Gait Assistance: 5: Supervision Ambulation Distance (Feet): 25 Feet Assistive device: Rolling walker Ambulation/Gait Assistance Details: Verbal cues for technique;Verbal cues for precautions/safety;Verbal cues for safe use of DME/AE Gait Gait: Yes Gait Pattern: Impaired Gait Pattern: Poor foot clearance - right Gait velocity: significantly decreased Stairs / Additional Locomotion Stairs: No Wheelchair Mobility Wheelchair Mobility: Yes Wheelchair Assistance: 6: Modified independent (Device/Increase time) Environmental health practitioner: Both upper extremities Wheelchair Parts Management: Independent Distance: 150'  Trunk/Postural Assessment  Cervical Assessment Cervical Assessment: Within Functional Limits Thoracic Assessment Thoracic Assessment: Within Functional Limits Lumbar Assessment Lumbar Assessment: Within Functional Limits Postural Control Postural Control: Within  Functional Limits  Balance Balance Balance Assessed: Yes Static Sitting Balance Static Sitting - Balance Support: Feet supported Static Sitting - Level of Assistance: 6: Modified independent (Device/Increase time) Dynamic Sitting Balance Dynamic Sitting - Balance Support: During functional activity Dynamic Sitting - Level of Assistance: 6: Modified independent (Device/Increase time) Static Standing Balance Static Standing - Balance Support: During functional activity Static Standing - Level of Assistance: 6: Modified independent (Device/Increase time) Dynamic Standing Balance Dynamic Standing - Balance Support: During functional activity;Bilateral upper extremity supported Dynamic Standing - Level of Assistance: 5: Stand by  assistance Extremity Assessment  RUE Assessment RUE Assessment: Within Functional Limits LUE Assessment LUE Assessment: Within Functional Limits RLE Assessment RLE Assessment: Within Functional Limits LLE Assessment LLE Assessment: Exceptions to WFL(grossly 4+/5 throughout)   See Function Navigator for Current Functional Status.  Benjiman Core Tygielski 03/15/2018, 2:12 PM

## 2018-03-15 NOTE — Progress Notes (Signed)
Occupational Therapy Session Note  Patient Details  Name: [name] MRN: [id number] Date of Birth: 07-Feb-1976  Today's Date: 03/15/2018 OT Individual Time: 1500-1600 OT Individual Time Calculation (min): 60 min (make up time)   Short Term Goals: Week 1:  OT Short Term Goal 1 (Week 1): STGs equal to LTGs based on ELOS   Skilled Therapeutic Interventions/Progress Updates:    Upon entering the room, pt supine in bed with no c/o pain and agreeable to OT intervention. Pt performed stand pivot transfer with supervision and use of RW into wheelchair. Session focus on community mobility task from wheelchair level. Pt propelled wheelchair with B UE's onto elevator and to cafe. Pt obtaining snack items from shelves and drink from refrigerator independently. Pt propelling chair outside on various surfaces with at overall mod I level. Pt returning back to room at end of session and remaining in wheelchair. Call bell and all needed items within reach upon exiting the room.   Therapy Documentation Precautions:  Precautions Precautions: Fall Required Braces or Orthoses: Other Brace/Splint Other Brace/Splint: limbguard Restrictions Weight Bearing Restrictions: Yes LLE Weight Bearing: Non weight bearing General:   Vital Signs: Therapy Vitals Temp: 97.9 F (36.6 C) Temp Source: Oral Pulse Rate: 97 Resp: 16 BP: 123/77 Patient Position (if appropriate): Sitting Oxygen Therapy SpO2: 94 % O2 Device: Room Air Pain: Pain Assessment Pain Scale: 0-10 ADL:   Vision Baseline Vision/History: Wears glasses Wears Glasses: At all times Patient Visual Report: No change from baseline Vision Assessment?: No apparent visual deficits Perception  Perception: Within Functional Limits Praxis Praxis: Intact Exercises:   Other Treatments:    See Function Navigator for Current Functional Status.   Therapy/Group: Individual Therapy  Gypsy Decant 03/15/2018, 4:22 PM

## 2018-03-15 NOTE — Progress Notes (Signed)
[place] PHYSICAL MEDICINE & REHABILITATION     PROGRESS NOTE  Subjective/Complaints:  Patient seen lying in bed this morning. She states she slept fairly overnight because she kept hitting her stump against the table. Encouraged patient to don stump protector at night. Patient tells me that her discharge date has been moved up in that she will be going home tomorrow morning.  ROS:  Denies CP, SOB, N/V/D.  Objective: Vital Signs: Blood pressure (!) 122/54, pulse 91, temperature 98.4 F (36.9 C), temperature source Oral, resp. rate 18, height 5\' 2"  (1.575 m), weight (!) 146.7 kg (323 lb 6.6 oz), last menstrual period 02/17/2018, SpO2 100 %. No results found. Recent Labs    03/12/18 1034 03/13/18 0740  WBC 8.7 10.3  HGB 8.0* 8.6*  HCT 25.1* 27.6*  PLT 387 444*   No results for input(s): NA, K, CL, GLUCOSE, BUN, CREATININE, CALCIUM in the last 72 hours.  Invalid input(s): CO CBG (last 3)  Recent Labs    03/14/18 1641 03/14/18 2057 03/15/18 0635  GLUCAP 180* 192* 195*    Wt Readings from Last 3 Encounters:  03/06/18 (!) 146.7 kg (323 lb 6.6 oz)  03/03/18 (!) 146.1 kg (322 lb)  02/23/18 (!) 146.1 kg (322 lb)    Physical Exam:  BP (!) 122/54 (BP Location: Left Arm)   Pulse 91   Temp 98.4 F (36.9 C) (Oral)   Resp 18   Ht 5\' 2"  (1.575 m)   Wt (!) 146.7 kg (323 lb 6.6 oz)   LMP 02/17/2018   SpO2 100%   BMI 59.15 kg/m  Constitutional: She appears well-developed. Obese. + distressed HENT: Normocephalic and atraumatic.  Eyes: EOM are normal. No discharge.  Cardiovascular: RRR and no JVD. Respiratory: Breath sounds normal. clear. GI: Bowel sounds are normal. She exhibits no distension.  Musculoskeletal: Left stump extremely TTP , unchanged Neurological: She is alert.  Motor:  4/5 Left hip flexion (stable) Skin: BKA with dressings C/D/I Psychiatric: She has a normal mood and affect. Her behavior is normal.   Assessment/Plan: 1. Functional deficits secondary to  left BKA which require 3+ hours per day of interdisciplinary therapy in a comprehensive inpatient rehab setting. Physiatrist is providing close team supervision and 24 hour management of active medical problems listed below. Physiatrist and rehab team continue to assess barriers to discharge/monitor patient progress toward functional and medical goals.  Function:  Bathing Bathing position   Position: Shower  Bathing parts Body parts bathed by patient: Right arm, Left arm, Chest, Abdomen, Front perineal area, Right upper leg, Left upper leg, Right lower leg Body parts bathed by helper: Buttocks  Bathing assist Assist Level: Supervision or verbal cues      Upper Body Dressing/Undressing Upper body dressing   What is the patient wearing?: Pull over shirt/dress, Bra Bra - Perfomed by patient: Thread/unthread right bra strap, Thread/unthread left bra strap, Hook/unhook bra (pull down sports bra)   Pull over shirt/dress - Perfomed by patient: Thread/unthread right sleeve, Thread/unthread left sleeve, Put head through opening, Pull shirt over trunk          Upper body assist Assist Level: Set up      Lower Body Dressing/Undressing Lower body dressing   What is the patient wearing?: Pants, Non-skid slipper socks Underwear - Performed by patient: Thread/unthread right underwear leg, Thread/unthread left underwear leg, Pull underwear up/down Underwear - Performed by helper: Pull underwear up/down Pants- Performed by patient: Thread/unthread right pants leg, Thread/unthread left pants leg, Pull pants  up/down Pants- Performed by helper: Pull pants up/down Non-skid slipper socks- Performed by patient: Don/doff right sock Non-skid slipper socks- Performed by helper: Don/doff right sock     Shoes - Performed by patient: Don/doff right shoe(left shoe NA)            Lower body assist Assist for lower body dressing: Supervision or verbal cues      Toileting Toileting   Toileting steps  completed by patient: Adjust clothing prior to toileting, Adjust clothing after toileting Toileting steps completed by helper: Performs perineal hygiene Toileting Assistive Devices: (walker)  Toileting assist Assist level: Touching or steadying assistance (Pt.75%)   Transfers Chair/bed transfer   Chair/bed transfer method: Stand pivot Chair/bed transfer assist level: Supervision or verbal cues Chair/bed transfer assistive device: Armrests, Medical sales representative     Max distance: 15 Assist level: Touching or steadying assistance (Pt > 75%)   Wheelchair   Type: Manual Max wheelchair distance: 150' Assist Level: Supervision or verbal cues  Cognition Comprehension Comprehension assist level: Follows complex conversation/direction with no assist  Expression Expression assist level: Expresses complex ideas: With no assist  Social Interaction Social Interaction assist level: Interacts appropriately with others - No medications needed.  Problem Solving Problem solving assist level: Solves complex problems: Recognizes & self-corrects  Memory Memory assist level: Complete Independence: No helper    Medical Problem List and Plan: 1.  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    Continue CIR  2.  DVT Prophylaxis/Anticoagulation: SCD right lower extremity.    Lovenox started on 4/2 3. Pain Management/chronic pain syndrome: Neurontin 1200 mg 3 times daily, and Robaxin as needed   Oxycodone changed to Hydrocodone on 4/9 4. Mood/bipolar disorder: Provide emotional support 5. Neuropsych: This patient is capable of making decisions on her own behalf. 6. Skin/Wound Care: Routine skin checks 7. Fluids/Electrolytes/Nutrition: Routine I&O's  8.  Diabetes mellitus with peripheral neuropathy.  Latest hemoglobin A1c 13.8.     NovoLog 6 units 3 times daily   Lantus insulin 42 units every morning, increased  to 50 on 4/3   Lantus 10 qhs started on 4/9   Victoza 1.8 mg every morning.     Check blood sugars before meals and at bedtime.  Diabetic teaching   Will require ambulatory adjustments as outpatient    Vitals:   03/14/18 1303 03/15/18 0040  BP: 131/75 (!) 122/54  Pulse: 93 91  Resp: 19 18  Temp: 98.5 F (36.9 C) 98.4 F (36.9 C)  SpO2: 100% 100%   9.  COPD.  CPAP. 10.  Morbid obesity.  Dietary follow-up 11.  Hypertension.  Coreg 3.125 mg twice daily.  Check blood sugars before meals and at bedtime   Relatively controlled on 4/10 12.  Hyperlipidemia.  Lipitor 13.  Constipation.  Laxative assistance, senna 2 qhs 14. Leukocytosis   resolved 15. ABLA   Hb 8.6 on 4/8   Cont to monitor 16.  Hypokalemia:   K+ 3.3 on 4/5   Labs ordered for tomorrow   KCL supplement   LOS (Days) 9 A FACE TO FACE EVALUATION WAS PERFORMED  Darthula Desa Lorie Phenix 03/15/2018 8:02 AM

## 2018-03-15 NOTE — Progress Notes (Signed)
Physical Therapy Session Note  Patient Details  Name: [name] MRN: [id number] Date of Birth: Sep 27, 1976  Today's Date: 03/15/2018 PT Individual Time: 0830-0900 PT Individual Time Calculation (min): 30 min   Short Term Goals: Week 1:  PT Short Term Goal 1 (Week 1): =LTG due to estimated LOS  Skilled Therapeutic Interventions/Progress Updates:    Pt supine in bed, agreeable to participate in therapy session. Pt receiving medication and having dressing change performed by nursing to LLE at beginning of therapy session. Pt does not rate pain but reports having increased pain with dressing change and when donning shrinker for L residual limb. Supine to sit Mod I. Pt is able to dress herself seated EOB with assist to gather clothing items. Stand pivot transfer bed to commode with RW and Mod I. Pt is able to perform 3/3 toileting steps. Stand pivot transfer commode to w/c with RW Mod I. Pt left seated in w/c in room with needs in reach.  Therapy Documentation Precautions:  Precautions Precautions: Fall Restrictions Weight Bearing Restrictions: Yes LLE Weight Bearing: Non weight bearing  See Function Navigator for Current Functional Status.   Therapy/Group: Individual Therapy  Excell Seltzer, PT, DPT  03/15/2018, 12:11 PM

## 2018-03-16 ENCOUNTER — Inpatient Hospital Stay (HOSPITAL_COMMUNITY): Payer: Self-pay | Admitting: Physical Therapy

## 2018-03-16 DIAGNOSIS — Z89422 Acquired absence of other left toe(s): Secondary | ICD-10-CM | POA: Diagnosis not present

## 2018-03-16 DIAGNOSIS — G4733 Obstructive sleep apnea (adult) (pediatric): Secondary | ICD-10-CM | POA: Diagnosis not present

## 2018-03-16 DIAGNOSIS — E669 Obesity, unspecified: Secondary | ICD-10-CM | POA: Diagnosis not present

## 2018-03-16 DIAGNOSIS — J45909 Unspecified asthma, uncomplicated: Secondary | ICD-10-CM | POA: Diagnosis not present

## 2018-03-16 DIAGNOSIS — Z89512 Acquired absence of left leg below knee: Secondary | ICD-10-CM | POA: Diagnosis not present

## 2018-03-16 DIAGNOSIS — M86172 Other acute osteomyelitis, left ankle and foot: Secondary | ICD-10-CM | POA: Diagnosis not present

## 2018-03-16 DIAGNOSIS — J9620 Acute and chronic respiratory failure, unspecified whether with hypoxia or hypercapnia: Secondary | ICD-10-CM | POA: Diagnosis not present

## 2018-03-16 LAB — BASIC METABOLIC PANEL
Anion gap: 11 (ref 5–15)
BUN: 9 mg/dL (ref 6–20)
CALCIUM: 8.6 mg/dL — AB (ref 8.9–10.3)
CO2: 25 mmol/L (ref 22–32)
Chloride: 100 mmol/L — ABNORMAL LOW (ref 101–111)
Creatinine, Ser: 0.68 mg/dL (ref 0.44–1.00)
GLUCOSE: 150 mg/dL — AB (ref 65–99)
POTASSIUM: 3.6 mmol/L (ref 3.5–5.1)
SODIUM: 136 mmol/L (ref 135–145)

## 2018-03-16 LAB — GLUCOSE, CAPILLARY: GLUCOSE-CAPILLARY: 153 mg/dL — AB (ref 65–99)

## 2018-03-16 MED ORDER — PANTOPRAZOLE SODIUM 40 MG PO TBEC: 40.0000 mg | DELAYED_RELEASE_TABLET | Freq: Every day | ORAL | 1 refills | Status: DC

## 2018-03-16 MED ORDER — INSULIN GLARGINE 100 UNITS/ML SOLOSTAR PEN: 50.0000 [IU] | PEN_INJECTOR | Freq: Every day | SUBCUTANEOUS | 11 refills | Status: DC

## 2018-03-16 MED ORDER — ACCU-CHEK NANO SMARTVIEW W/DEVICE KIT: PACK | 0 refills | Status: DC

## 2018-03-16 MED ORDER — ATORVASTATIN CALCIUM 20 MG PO TABS: 20.0000 mg | ORAL_TABLET | Freq: Every day | ORAL | 5 refills | Status: DC

## 2018-03-16 MED ORDER — LIRAGLUTIDE 18 MG/3ML ~~LOC~~ SOPN: PEN_INJECTOR | SUBCUTANEOUS | 2 refills | Status: DC

## 2018-03-16 MED ORDER — GLUCOSE BLOOD VI STRP: ORAL_STRIP | 5 refills | Status: DC

## 2018-03-16 MED ORDER — POLYETHYLENE GLYCOL 3350 17 G PO PACK: 17.0000 g | PACK | Freq: Two times a day (BID) | ORAL | 0 refills | Status: DC

## 2018-03-16 MED ORDER — INSULIN ASPART 100 UNIT/ML FLEXPEN: 6.0000 [IU] | PEN_INJECTOR | Freq: Three times a day (TID) | SUBCUTANEOUS | 11 refills | Status: DC

## 2018-03-16 MED ORDER — GABAPENTIN 600 MG PO TABS: 1200.0000 mg | ORAL_TABLET | Freq: Three times a day (TID) | ORAL | 5 refills | Status: DC

## 2018-03-16 MED ORDER — ACCU-CHEK SOFTCLIX LANCETS MISC: 12 refills | Status: DC

## 2018-03-16 MED ORDER — BUDESONIDE-FORMOTEROL FUMARATE 160-4.5 MCG/ACT IN AERO: INHALATION_SPRAY | RESPIRATORY_TRACT | 4 refills | Status: DC

## 2018-03-16 MED ORDER — HYDROCODONE-ACETAMINOPHEN 5-325 MG PO TABS: 1.0000 | ORAL_TABLET | Freq: Four times a day (QID) | ORAL | 0 refills | Status: DC | PRN

## 2018-03-16 MED ORDER — NITROGLYCERIN 0.2 MG/HR TD PT24: 0.2000 mg | MEDICATED_PATCH | Freq: Every day | TRANSDERMAL | 12 refills | Status: DC

## 2018-03-16 MED ORDER — CARVEDILOL 3.125 MG PO TABS: ORAL_TABLET | ORAL | 3 refills | Status: DC

## 2018-03-16 MED ORDER — INSULIN GLARGINE 100 UNITS/ML SOLOSTAR PEN: 10.0000 [IU] | PEN_INJECTOR | Freq: Every day | SUBCUTANEOUS | 11 refills | Status: DC

## 2018-03-16 MED ORDER — METHOCARBAMOL 500 MG PO TABS: 500.0000 mg | ORAL_TABLET | Freq: Four times a day (QID) | ORAL | 0 refills | Status: DC | PRN

## 2018-03-16 NOTE — Progress Notes (Signed)
Social Work Patient ID: [name], female   DOB: 07-Jul-1976, 42 y.o.   MRN: [id number]   CSW talked with pt to update her after team conference yesterday.  Pt feels ready to go home, as does team.  Pt's wheelchair needs to switched to the correct size.  CSW communicated this with [facility] and it was delivered to pt's room.  CSW also ordered other DME and arranged [place] for pt.  Pt appreciative of going home sooner, but also for everyone on the team getting her to this point.  CSW sent change of medical status for pt to [employer] for [employer] to resume.  Also talked with [facility] caregiver agency to make them aware of pt's d/c.  Pt enjoyed having a peer support visit and CSW gave her amputee support group information, as well.  CSW remains available to assist as needed.

## 2018-03-16 NOTE — Progress Notes (Signed)
Patient discharged to home per wheelchair accompanied accompanied by NT and family members. Discharge instructions done by [facility]. Dressing changed before discharge done.

## 2018-03-16 NOTE — Discharge Summary (Signed)
[name], [name]                 ACCOUNT NO.:  [id number]  MEDICAL RECORD NO.:  [id number]  LOCATION:  4M03C                        FACILITY:  [facility]  PHYSICIAN:  Delice Lesch, MD        DATE OF BIRTH:  Jul 17, 1976  DATE OF ADMISSION:  03/06/2018 DATE OF DISCHARGE:  03/16/2018                              DISCHARGE SUMMARY   DISCHARGE DIAGNOSES: 1. Left transtibial amputation secondary to osteomyelitis. 2. SCDs for deep vein thrombosis prophylaxis of the right lower     extremity as well as Lovenox. 3. Pain management. 4. Diabetes mellitus and peripheral neuropathy. 5. Chronic obstructive pulmonary disease. 6. Hypertension. 7. Hyperlipidemia. 8. Constipation.  This is a 42 year old right-handed female, history of morbid obesity, diabetes mellitus with poor control, bipolar disorder, COPD, recent 27th and 5th ray amputations of the left foot on February 01, 2018.  Lives with spouse.  Used a rolling walker.  Presented to the office of Dr. Sharol Given on February 27, 2018, with dehiscence of wound, diagnosed osteomyelitis.  She was admitted and underwent left transtibial amputation on March 03, 2018.  Wound VAC applied.  She remained on CPAP as prior to admission.  Physical and occupational therapy ongoing.  The patient was admitted for a comprehensive rehab program.  PAST MEDICAL HISTORY:  See discharge diagnoses.  SOCIAL HISTORY:  Lives with spouse.  Used a rolling walker prior to admission.  FUNCTIONAL STATUS:  Upon admission to [facility] was minimal assist stand pivot transfers, minimal assist sit to stand, min to mod assist activities daily living.  PHYSICAL EXAMINATION:  VITAL SIGNS:  Blood pressure 131/68, pulse 110, temperature 98, and respirations 18. GENERAL:  Alert female, in no acute distress. HEENT:  EOMs intact. NECK:  Supple.  Nontender.  No JVD. CARDIAC:  Rate controlled. ABDOMEN:  Soft, nontender.  Good bowel sounds. EXTREMITIES:  Wound VAC in place to  amputation site, appropriately tender.  REHABILITATION HOSPITAL COURSE:  The patient was admitted to Inpatient Rehab Services with therapies initiated on a 3-hour daily basis, consisting of physical therapy, occupational therapy, and rehabilitation nursing.  The following issues were addressed during the patient's rehabilitation stay.  Pertaining to [name]' left transtibial amputation, surgical site healing nicely.  She would follow up with Orthopedic Services, Dr. Sharol Given.  Subcutaneous Lovenox on initiated March 07, 2018, for DVT prophylaxis.  Pain management with the use of Neurontin 1200 mg 3 times daily as well as Robaxin and hydrocodone as needed for pain.  Her blood sugars uncontrolled prior to admission. Hemoglobin A1c of 13.8.  Insulin therapy as directed.  She received full diabetic teaching.  She would require ambulatory adjustments as an outpatient.  She had a history of COPD, CPAP as advised.  Blood pressures controlled and monitored, on Coreg.  Acute blood loss anemia, 8.6, monitored.  She remained asymptomatic.  The patient received weekly collaborative interdisciplinary team conferences to discuss estimated length of stay, family teaching, any barriers to her discharge. Performed bed to wheelchair, transfers, rolling walker supervision. Performed car transfers with supervision stand pivot.  Husband working with family teaching.  Working with energy conservation techniques.  She could propel her wheelchair with supervision.  Activities of daily living and homemaking.  Performed stand pivot transfers, with supervision, rolling walker to the wheelchair.  Sessions focused on community mobility tasks from wheelchair level.  Full family teaching was completed and plan discharge to home.  DISCHARGE MEDICATIONS: 1. Lipitor 20 mg p.o. daily. 2. Coreg 3.125 mg p.o. b.i.d. 3. Neurontin 1200 mg p.o. t.i.d. 4. NovoLog 6 units t.i.d. 5. Lantus insulin 50 units a.m., 10 units at  bedtime. 6. Dulera 2 puffs twice daily. 7. Nitro-Dur patch 0.2 mg daily. 8. Protonix 40 mg p.o. daily. 9. MiraLAX twice daily, hold for loose stool. 10.Senokot-S 2 tablets p.o. at bedtime. 11.Hydrocodone 5-325 mg 1 tablet every 6 as needed pain. 12.Robaxin 500 mg every 6 hours as needed for muscle spasms.  DIET:  Her diet was a diabetic diet.  FOLLOWUP:  She would follow up with Dr. Delice Lesch at the Outpatient Rehab Service office as directed; Dr. Meridee Score, call for appointment; Dr. [name], medical management.     Lauraine Rinne, P.A.   ______________________________ Delice Lesch, MD    DA/MEDQ  D:  03/16/2018  T:  03/16/2018  Job:  [id number]  cc:   [name], MD Delice Lesch, MD [name], MD

## 2018-03-16 NOTE — Discharge Instructions (Signed)
Inpatient Rehab Discharge Instructions  [name] Discharge date and time: No discharge date for patient encounter.   Activities/Precautions/ Functional Status: Activity: activity as tolerated Diet: diabetic diet Wound Care: keep wound clean and dry Functional status:  ___ No restrictions     ___ Walk up steps independently ___ 24/7 supervision/assistance   ___ Walk up steps with assistance ___ Intermittent supervision/assistance  ___ Bathe/dress independently ___ Walk with walker     _x__ Bathe/dress with assistance ___ Walk Independently    ___ Shower independently ___ Walk with assistance    ___ Shower with assistance ___ No alcohol     ___ Return to work/school ________  COMMUNITY REFERRALS UPON DISCHARGE:   Home Health:   PT     OT     RN  Agency:  [facility] Phone:  [phone] Medical Equipment/Items Ordered:  22"x18" wheelchair with basic cushion and left amputee support pad; tub transfer bench;   Agency/Supplier:  [facility]         Phone:  [phone]  GENERAL COMMUNITY RESOURCES FOR PATIENT/FAMILY: Support Groups:  [facility] [place]                              Meets the second Tuesday of each month 7-8:30 PM                              [facility]                              For more information, call [phone]  Special Instructions:    My questions have been answered and I understand these instructions. I will adhere to these goals and the provided educational materials after my discharge from the hospital.  Patient/Caregiver Signature _______________________________ Date __________  Clinician Signature _______________________________________ Date __________  Please bring this form and your medication list with you to all your follow-up doctor's appointments.

## 2018-03-16 NOTE — Progress Notes (Signed)
[place] PHYSICAL MEDICINE & REHABILITATION     PROGRESS NOTE  Subjective/Complaints:  Patient seen lying in bed this morning. She states she slept well overnight. She states she is ready for discharge.  ROS:   Denies CP, SOB, N/V/D.  Objective: Vital Signs: Blood pressure 130/73, pulse 92, temperature 98.6 F (37 C), temperature source Oral, resp. rate 18, height 5\' 2"  (1.575 m), weight (!) 146.7 kg (323 lb 6.6 oz), last menstrual period 02/17/2018, SpO2 (!) 87 %. No results found. No results for input(s): WBC, HGB, HCT, PLT in the last 72 hours. No results for input(s): NA, K, CL, GLUCOSE, BUN, CREATININE, CALCIUM in the last 72 hours.  Invalid input(s): CO CBG (last 3)  Recent Labs    03/15/18 1645 03/15/18 2117 03/16/18 0626  GLUCAP 169* 183* 153*    Wt Readings from Last 3 Encounters:  03/06/18 (!) 146.7 kg (323 lb 6.6 oz)  03/03/18 (!) 146.1 kg (322 lb)  02/23/18 (!) 146.1 kg (322 lb)    Physical Exam:  BP 130/73 (BP Location: Left Arm)   Pulse 92   Temp 98.6 F (37 C) (Oral)   Resp 18   Ht 5\' 2"  (1.575 m)   Wt (!) 146.7 kg (323 lb 6.6 oz)   LMP 02/17/2018   SpO2 (!) 87%   BMI 59.15 kg/m  Constitutional: She appears well-developed. Obese. + distressed HENT: Normocephalic and atraumatic.  Eyes: EOM are normal. No discharge.  Cardiovascular: RRRR and no JVD. Respiratory: Breath sounds normal. clear. GI: Bowel sounds are normal. She exhibits no distension.  Musculoskeletal: Left stump extremely TTP , stable Neurological: She is alert.  Motor:  4-4+/5 Left hip flexion  Skin: BKA with dressings C/D/I Psychiatric: She has a normal mood and affect. Her behavior is normal.   Assessment/Plan: 1. Functional deficits secondary to left BKA which require 3+ hours per day of interdisciplinary therapy in a comprehensive inpatient rehab setting. Physiatrist is providing close team supervision and 24 hour management of active medical problems listed  below. Physiatrist and rehab team continue to assess barriers to discharge/monitor patient progress toward functional and medical goals.  Function:  Bathing Bathing position   Position: Shower  Bathing parts Body parts bathed by patient: Right arm, Left arm, Chest, Abdomen, Front perineal area, Right upper leg, Left upper leg, Right lower leg, Back Body parts bathed by helper: Buttocks  Bathing assist Assist Level: Supervision or verbal cues      Upper Body Dressing/Undressing Upper body dressing   What is the patient wearing?: Pull over shirt/dress, Bra Bra - Perfomed by patient: Thread/unthread right bra strap, Thread/unthread left bra strap, Hook/unhook bra (pull down sports bra)   Pull over shirt/dress - Perfomed by patient: Thread/unthread right sleeve, Thread/unthread left sleeve, Put head through opening, Pull shirt over trunk          Upper body assist Assist Level: More than reasonable time      Lower Body Dressing/Undressing Lower body dressing   What is the patient wearing?: Underwear, Pants, Non-skid slipper socks, Shoes Underwear - Performed by patient: Thread/unthread right underwear leg, Thread/unthread left underwear leg, Pull underwear up/down Underwear - Performed by helper: Pull underwear up/down Pants- Performed by patient: Thread/unthread right pants leg, Thread/unthread left pants leg, Pull pants up/down Pants- Performed by helper: Pull pants up/down Non-skid slipper socks- Performed by patient: Don/doff right sock Non-skid slipper socks- Performed by helper: Don/doff right sock     Shoes - Performed by patient: Don/doff right shoe  Lower body assist Assist for lower body dressing: More than reasonable time      Toileting Toileting   Toileting steps completed by patient: Adjust clothing prior to toileting, Performs perineal hygiene, Adjust clothing after toileting Toileting steps completed by helper: Performs perineal hygiene Toileting  Assistive Devices: Other (comment)(walker)  Toileting assist Assist level: More than reasonable time   Transfers Chair/bed transfer   Chair/bed transfer method: Stand pivot Chair/bed transfer assist level: No Help, no cues, assistive device, takes more than a reasonable amount of time Chair/bed transfer assistive device: Medical sales representative     Max distance: 15 Assist level: Supervision or verbal cues   Wheelchair   Type: Manual Max wheelchair distance: 300' Assist Level: No help, No cues, assistive device, takes more than reasonable amount of time  Cognition Comprehension Comprehension assist level: Follows complex conversation/direction with no assist  Expression Expression assist level: Expresses complex ideas: With no assist  Social Interaction Social Interaction assist level: Interacts appropriately with others - No medications needed.  Problem Solving Problem solving assist level: Solves complex problems: Recognizes & self-corrects  Memory Memory assist level: Complete Independence: No helper    Medical Problem List and Plan: 1.  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    DC today  Will see patient for transitional care management in 1-2 weeks 2.  DVT Prophylaxis/Anticoagulation: SCD right lower extremity.    Lovenox started on 4/2 3. Pain Management/chronic pain syndrome: Neurontin 1200 mg 3 times daily, and Robaxin as needed   Oxycodone changed to Hydrocodone on 4/9 4. Mood/bipolar disorder: Provide emotional support 5. Neuropsych: This patient is capable of making decisions on her own behalf. 6. Skin/Wound Care: Routine skin checks 7. Fluids/Electrolytes/Nutrition: Routine I&O's  8.  Diabetes mellitus with peripheral neuropathy.  Latest hemoglobin A1c 13.8.     NovoLog 6 units 3 times daily   Lantus insulin 42 units every morning, increased to 50 on 4/3   Lantus 10  qhs started on 4/9   Victoza 1.8 mg every morning.     Check blood sugars before meals and at bedtime.  Diabetic teaching   Slightly improved on 4/11, will require ambulatory adjustments as outpatient    Vitals:   03/15/18 2153 03/16/18 0055  BP: (!) 145/93 130/73  Pulse: 98 92  Resp:  18  Temp:  98.6 F (37 C)  SpO2: 98% (!) 87%   9.  COPD.  CPAP. 10.  Morbid obesity.  Dietary follow-up 11.  Hypertension.  Coreg 3.125 mg twice daily.  Check blood sugars before meals and at bedtime   Relatively controlled on 4/11 12.  Hyperlipidemia.  Lipitor 13.  Constipation.  Laxative assistance, senna 2 qhs 14. Leukocytosis   resolved 15. ABLA   Hb 8.6 on 4/8   Cont to monitor 16.  Hypokalemia:   K+ 3.3 on 4/5   Labs pending   KCL supplement   LOS (Days) 10 A FACE TO FACE EVALUATION WAS PERFORMED  Ankit Lorie Phenix 03/16/2018 8:47 AM

## 2018-03-16 NOTE — Progress Notes (Signed)
Anticipate home discharge today patient is anxious but look forward in discharge, has equipment in room states she will be transported via ambulance services. Dressing to surgical leg redressed due to drainage and monitor. Assisted patient in reapplying Right leg [place] after dressing changed, tolerated well.Marland Kitchen

## 2018-03-16 NOTE — Discharge Summary (Signed)
Discharge summary job [id number]

## 2018-03-17 NOTE — Patient Care Conference (Signed)
Inpatient RehabilitationTeam Conference and Plan of Care Update Date: 03/15/2018   Time: 2:00 PM    Patient Name: [name]      Medical Record Number: [id number]  Date of Birth: 1975-12-10 Sex: Female         Room/Bed: 4M03C/4M03C-01 Payor Info: Payor: MEDICAID [place] / Plan: MEDICAID [place] ACCESS / Product Type: *No Product type* /    Admitting Diagnosis: BKA  Admit Date/Time:  03/06/2018  2:47 PM Admission Comments: No comment available   Primary Diagnosis:  Unilateral complete BKA, left, sequela ([place]) Principal Problem: Unilateral complete BKA, left, sequela [facility])  Patient Active Problem List   Diagnosis Date Noted  . Labile blood pressure   . Hypokalemia   . Post-op pain   . Acute blood loss anemia   . Allergic rhinitis 03/06/2018  . Amputation of left lower extremity below knee ([place]) 03/06/2018  . Unilateral complete BKA, left, sequela ([place])   . Chronic pain syndrome   . Neuropathic pain   . Poorly controlled type 2 diabetes mellitus with peripheral neuropathy ([place])   . Super-super obese ([place])   . Benign essential HTN   . Acquired absence of leg below knee ([employer]) 03/03/2018  . Dehiscence of amputation stump ([place])   . Amputated toe of left foot ([place]) 02/10/2018  . Acute osteomyelitis of ankle or foot, left ([place]) 01/31/2018  . Acute on chronic blood loss anemia   . AKI (acute kidney injury) ([place])   . Type 2 diabetes mellitus with foot ulcer, with long-term current use of insulin ([place])   . Chronic obstructive pulmonary disease ([place])   . Subacute osteomyelitis, left ankle and foot ([place])   . Diabetic foot ulcer ([place]) 10/29/2017  . Ulcer of skin ([place]) 10/14/2017  . Decreased pedal pulses 06/10/2017  . Adjustment disorder with mixed anxiety and depressed mood 11/04/2016  . History of medication noncompliance 11/04/2016  . Unilateral primary osteoarthritis, right knee 10/22/2016  . Diabetic neuropathy ([place]) 07/14/2016  . Syncope 02/25/2016  . De Quervain's tenosynovitis,  bilateral 11/01/2015  . Vitamin D deficiency 09/05/2015  . Recurrent candidiasis of vagina 09/05/2015  . Varicose veins of leg with complications 61/60/7371  . Neuropathy, intercostal nerve 12/17/2014  . Rectal itching 11/17/2014  . Restless leg syndrome 10/17/2014  . Chronic sinusitis 07/18/2014  . Headache 07/15/2014  . Vision, loss, sudden 07/12/2014  . Urge incontinence 10/15/2013  . Encounter for chronic pain management 06/30/2013  . Low back pain 01/31/2013  . HLD (hyperlipidemia) 11/19/2012  . Chest pain 06/27/2012  . Right carpal tunnel syndrome 09/01/2011  . Bilateral knee pain 09/01/2011  . Diabetes mellitus ([place]) 05/22/2008  . Morbid obesity ([place]) 05/22/2008  . 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 Depression 05/22/2008  . Obstructive sleep apnea 05/22/2008  . Hypertension 05/22/2008  . Asthma 05/22/2008  . GERD 05/22/2008    Expected Discharge Date: Expected Discharge Date: 03/16/18  Team Members Present: Physician leading conference: Dr. Delice Lesch Social Worker Present: Alfonse Alpers, LCSW Nurse Present: Dorthula Nettles, RN PT Present: Kem Parkinson, PT OT Present: Clyda Greener, OT PPS Coordinator present : Ileana Ladd, PT     Current Status/Progress Goal Weekly Team Focus  Medical   Decreased functional mobility secondary to left transtibial amputation secondary to osteomyelitis of the left foot 03/03/2018  Improve pain, hypokalemia, CBGs  See above   Bowel/Bladder   Continent of bowel/bladder LBM 03/13/2018, Refuse Myalax ,Voiding QS   Continent of B/B with normal pattern  QS assessment has PRN orders  for laxative   Swallow/Nutrition/ Hydration             ADL's   Set up UB self care, min A with cleansing bottom for bathing and toileting, supervision with other aspects of LB self care, Supervision stand pivot with RW  modified independent toilet transfer, toileting, dressing; supervision bathing and tub bench transfer  ADL training,  transfer training, AE/ DME training   Mobility   modI bed mobility, S transfers, min guard gait x15' with RW  modI w/c level, S gait short distances in home  transfer/gait training, activity tolerance, amputee education   Communication             Safety/Cognition/ Behavioral Observations            Pain   Continue to rate pain 7-8/ on pain scale to surg. BKA, reassessment -pain after  medication 4-5/10,prn Continue prn meds Norco,Ultram, Tylenol and Robaxin    Pain <=3/10  QS and prn assessment, follow up with reassessments,Notification to MD/PA as needed   Skin   s/p Left BKA with small drainage reported and staples intact , daily wound care per and prn   Continue  wound care as ordered, monitor for s/s infection, maintain skin from breakdown  QS and prn assessment of skin, notify MD/PA immedicately of changes    Rehab Goals Patient on target to meet rehab goals: Yes Rehab Goals Revised: none *See Care Plan and progress notes for long and short-term goals.     Barriers to Discharge  Current Status/Progress Possible Resolutions Date Resolved   Physician    Medical stability;Weight;Weight bearing restrictions     See above  Therapies, follow labs, optimize DM /pain meds      Nursing                  PT                    OT                  SLP                SW                Discharge Planning/Teaching Needs:  Pt to return to her home she shares with her husband and her 14 y/o dtr.  Pt's goals are mod I, but husband and mother came in for family education.   Team Discussion:  Pt talked with Dr. Posey Pronto about her pain management at home.  Dr. Posey Pronto is still making insulin adjustments and pt told RN that she wants to go home on sliding scale insulin and RN feels pt can manage this.  Pt is refusing Coreg daily.  Her potassium has been low and Dr. Posey Pronto will have labs run again on day of d/c.  Pt is S with rolling walker to bathroom and for bathing, dressing mod I.  Pt will be  mod I w/c level with supervision for gait.    Revisions to Treatment Plan:  none    Continued Need for Acute Rehabilitation Level of Care: The patient requires daily medical management by a physician with specialized training in physical medicine and rehabilitation for the following conditions: Daily direction of a multidisciplinary physical rehabilitation program to ensure safe treatment while eliciting the highest outcome that is of practical value to the patient.: Yes Daily medical management of patient stability for increased activity during participation in an intensive rehabilitation  regime.: Yes Daily analysis of laboratory values and/or radiology reports with any subsequent need for medication adjustment of medical intervention for : Post surgical problems;Wound care problems;Diabetes problems;Other  Jaye Saal, Silvestre Mesi 03/17/2018, 10:57 PM

## 2018-03-17 NOTE — Progress Notes (Addendum)
Social Work Discharge Note  The overall goal for the admission was met for:   Discharge location: Yes - home  Length of Stay: Yes - 10 days  Discharge activity level: Yes - mod I w/c; S gait  Home/community participation: Yes  Services provided included: MD, RD, PT, OT, RN, Pharmacy, Neuropsych and SW  Financial Services: Medicaid  Follow-up services arranged: Home Health: RN/PT/OT from [facility], DME: 22"x18" wheelchair with left amputee support pad and basic cushion; tub transfer bench from [facility] and Patient/Family has no preference for HH/DME agencies  Comments (or additional information): CSW also gave pt Amputee Support Group information, as well.   Patient/Family verbalized understanding of follow-up arrangements: Yes  Individual responsible for coordination of the follow-up plan: pt with support from her husband and mother  Confirmed correct DME delivered: Trey Sailors 03/17/2018    Marcell Chavarin, Silvestre Mesi

## 2018-03-18 ENCOUNTER — Other Ambulatory Visit: Payer: Self-pay

## 2018-03-18 ENCOUNTER — Emergency Department (HOSPITAL_BASED_OUTPATIENT_CLINIC_OR_DEPARTMENT_OTHER)
Admit: 2018-03-18 | Discharge: 2018-03-18 | Disposition: A | Discharge status: Home / Self Care | Payer: Medicaid Other | Attending: Emergency Medicine | Admitting: Emergency Medicine

## 2018-03-18 ENCOUNTER — Encounter (HOSPITAL_COMMUNITY): Payer: Self-pay | Admitting: Emergency Medicine

## 2018-03-18 ENCOUNTER — Emergency Department (HOSPITAL_COMMUNITY): Payer: Medicaid Other

## 2018-03-18 ENCOUNTER — Emergency Department (HOSPITAL_COMMUNITY)
Admission: EM | Admit: 2018-03-18 | Discharge: 2018-03-18 | Disposition: A | Discharge status: Home / Self Care | Payer: Medicaid Other | Attending: Emergency Medicine | Admitting: Emergency Medicine

## 2018-03-18 DIAGNOSIS — R609 Edema, unspecified: Secondary | ICD-10-CM

## 2018-03-18 DIAGNOSIS — Z9889 Other specified postprocedural states: Secondary | ICD-10-CM

## 2018-03-18 DIAGNOSIS — M79609 Pain in unspecified limb: Secondary | ICD-10-CM | POA: Diagnosis not present

## 2018-03-18 DIAGNOSIS — Z79899 Other long term (current) drug therapy: Secondary | ICD-10-CM | POA: Diagnosis not present

## 2018-03-18 DIAGNOSIS — Z87891 Personal history of nicotine dependence: Secondary | ICD-10-CM | POA: Diagnosis not present

## 2018-03-18 DIAGNOSIS — M7989 Other specified soft tissue disorders: Secondary | ICD-10-CM

## 2018-03-18 DIAGNOSIS — J449 Chronic obstructive pulmonary disease, unspecified: Secondary | ICD-10-CM | POA: Diagnosis not present

## 2018-03-18 DIAGNOSIS — Z794 Long term (current) use of insulin: Secondary | ICD-10-CM | POA: Diagnosis not present

## 2018-03-18 DIAGNOSIS — Z89512 Acquired absence of left leg below knee: Secondary | ICD-10-CM | POA: Diagnosis not present

## 2018-03-18 DIAGNOSIS — E119 Type 2 diabetes mellitus without complications: Secondary | ICD-10-CM | POA: Insufficient documentation

## 2018-03-18 DIAGNOSIS — G8918 Other acute postprocedural pain: Secondary | ICD-10-CM | POA: Diagnosis not present

## 2018-03-18 DIAGNOSIS — E1140 Type 2 diabetes mellitus with diabetic neuropathy, unspecified: Secondary | ICD-10-CM | POA: Diagnosis not present

## 2018-03-18 DIAGNOSIS — M79605 Pain in left leg: Secondary | ICD-10-CM | POA: Diagnosis present

## 2018-03-18 DIAGNOSIS — W19XXXA Unspecified fall, initial encounter: Secondary | ICD-10-CM

## 2018-03-18 LAB — CBC WITH DIFFERENTIAL/PLATELET
BASOS ABS: 0 10*3/uL (ref 0.0–0.1)
BASOS PCT: 0 %
EOS ABS: 0.3 10*3/uL (ref 0.0–0.7)
EOS PCT: 3 %
HCT: 32.7 % — ABNORMAL LOW (ref 36.0–46.0)
HEMOGLOBIN: 10.5 g/dL — AB (ref 12.0–15.0)
LYMPHS ABS: 3.1 10*3/uL (ref 0.7–4.0)
Lymphocytes Relative: 29 %
MCH: 27.8 pg (ref 26.0–34.0)
MCHC: 32.1 g/dL (ref 30.0–36.0)
MCV: 86.5 fL (ref 78.0–100.0)
Monocytes Absolute: 0.8 10*3/uL (ref 0.1–1.0)
Monocytes Relative: 7 %
NEUTROS PCT: 61 %
Neutro Abs: 6.8 10*3/uL (ref 1.7–7.7)
Platelets: 473 10*3/uL — ABNORMAL HIGH (ref 150–400)
RBC: 3.78 MIL/uL — AB (ref 3.87–5.11)
RDW: 12.9 % (ref 11.5–15.5)
WBC: 11 10*3/uL — AB (ref 4.0–10.5)

## 2018-03-18 LAB — COMPREHENSIVE METABOLIC PANEL
ALK PHOS: 102 U/L (ref 38–126)
ALT: 11 U/L — ABNORMAL LOW (ref 14–54)
ANION GAP: 9 (ref 5–15)
AST: 10 U/L — ABNORMAL LOW (ref 15–41)
Albumin: 2.8 g/dL — ABNORMAL LOW (ref 3.5–5.0)
BUN: 13 mg/dL (ref 6–20)
CALCIUM: 9 mg/dL (ref 8.9–10.3)
CO2: 25 mmol/L (ref 22–32)
Chloride: 100 mmol/L — ABNORMAL LOW (ref 101–111)
Creatinine, Ser: 0.77 mg/dL (ref 0.44–1.00)
Glucose, Bld: 232 mg/dL — ABNORMAL HIGH (ref 65–99)
Potassium: 3.8 mmol/L (ref 3.5–5.1)
SODIUM: 134 mmol/L — AB (ref 135–145)
Total Bilirubin: 0.3 mg/dL (ref 0.3–1.2)
Total Protein: 8 g/dL (ref 6.5–8.1)

## 2018-03-18 LAB — PROTIME-INR
INR: 1.05
PROTHROMBIN TIME: 13.6 s (ref 11.4–15.2)

## 2018-03-18 LAB — I-STAT CG4 LACTIC ACID, ED: LACTIC ACID, VENOUS: 1.41 mmol/L (ref 0.5–1.9)

## 2018-03-18 MED ORDER — OXYCODONE-ACETAMINOPHEN 5-325 MG PO TABS
1.0000 | ORAL_TABLET | Freq: Once | ORAL | Status: AC
  Administered 2018-03-18: 1 via ORAL
  Filled 2018-03-18: qty 1

## 2018-03-18 MED ORDER — OXYCODONE-ACETAMINOPHEN 5-325 MG PO TABS: 1.0000 | ORAL_TABLET | Freq: Four times a day (QID) | ORAL | 0 refills | Status: DC | PRN

## 2018-03-18 MED ORDER — CYCLOBENZAPRINE HCL 10 MG PO TABS
5.0000 mg | ORAL_TABLET | Freq: Once | ORAL | Status: AC
  Administered 2018-03-18: 5 mg via ORAL
  Filled 2018-03-18: qty 1

## 2018-03-18 NOTE — ED Notes (Signed)
Patient transported to X-ray 

## 2018-03-18 NOTE — Discharge Instructions (Signed)
You are leaving against medical advice by leaving before the consult with the orthopedic doctor. You could have delay of care due to this.   Take percocet instead of norco. Do not take the 2 medications together.  You may take flexeril as needed for muscle cramps/spasms. You amy take a 1/2 tablet if needed.  Call Dr. Jess Barters office on Monday to discuss follow up.  Return to the ER if you develop fevers, redness streaking up your leg, pus draining, or any new or concerning symptom.

## 2018-03-18 NOTE — ED Triage Notes (Signed)
Pt. Stated, I was just discharged on Thursday form a left below the knee amputation. The pain is so bad that started about 100 in the morning. It feels real tight.

## 2018-03-18 NOTE — ED Notes (Signed)
Patient asked for urine sample, unable to give sample at this time. 

## 2018-03-18 NOTE — ED Provider Notes (Signed)
[facility] EMERGENCY DEPARTMENT Provider Note   CSN: [id number] Arrival date & time: 03/18/18  1023     History   Chief Complaint Chief Complaint  Patient presents with  . Leg Pain    left    HPI [name] is a 42 y.o. female presenting for evaluation of postop pain.  Patient had a left BKA on March 29.  She was discharged from the hospital 2 days ago.  She has had minimal pain since discharge until 1:00 this morning, when she had severe, constant pain.  It is constantly sharp with intermittent spasms. No improvement of pain with at home norco and robaxin.  Her mom, who is her caretaker, states that the leg feels warm and appears more erythematous than previously.  Patient has accidentally had her knee twice since the amputation.  She is not currently on any antibiotics.  Minimal bloody drainage, no purulent drainage noted.  Patient denies fevers, chills, chest pain, shortness breath, nausea, vomiting, abdominal pain.  She is not on blood thinners.  No history of prior DVT.  Not on hormones.  No recent travel. Dr. Sharol Given performed the surgery  HPI  Past Medical History:  Diagnosis Date  . Acute osteomyelitis of ankle or foot, left ([place]) 01/31/2018  . Alveolar hypoventilation   . Anemia    not on iron pill  . Arthritis   . Asthma   . Bipolar 2 disorder ([place])   . Carpal tunnel syndrome on right    recurrent  . Cellulitis 08/2010-08/2011  . Cellulitis of left foot 01/27/2018  . Chronic pain   . COPD (chronic obstructive pulmonary disease) ([name])    Symbicort daily and Proventil as needed  . Costochondritis   . Depression   . Diabetes mellitus 2000   Type 2, Uncontrolled.Takes Lantus daily.Fasting blood sugar runs 150  . Dizziness    occasionally  . Drug-seeking behavior   . GERD (gastroesophageal reflux disease)    takes Pantoprazole and Zantac daily  . Headache    migraine-last one about a yr ago.Topamax daily  . HLD (hyperlipidemia)    takes  Atorvastatin daily  . Hypertension    takes Lisinopril and Coreg daily  . Morbid obesity ([place])   . Muscle spasm    takes Flexeril as needed  . Nocturia   . Obstructive sleep apnea   . Peripheral neuropathy    takes Gabapentin daily  . Pneumonia    "walking" several yrs ago and as a baby  . Rectal fissure   . Restless leg   . Urinary frequency   . Varicose veins    Right medial thigh and Left leg     Patient Active Problem List   Diagnosis Date Noted  . Labile blood pressure   . Hypokalemia   . Post-op pain   . Acute blood loss anemia   . Allergic rhinitis 03/06/2018  . Amputation of left lower extremity below knee ([place]) 03/06/2018  . Unilateral complete BKA, left, sequela ([place])   . Chronic pain syndrome   . Neuropathic pain   . Poorly controlled type 2 diabetes mellitus with peripheral neuropathy ([place])   . Super-super obese ([place])   . Benign essential HTN   . Acquired absence of leg below knee ([place]) 03/03/2018  . Dehiscence of amputation stump ([place])   . Amputated toe of left foot ([place]) 02/10/2018  . Acute osteomyelitis of ankle or foot, left ([place]) 01/31/2018  . Acute on chronic blood loss anemia   .  AKI (acute kidney injury) ([place])   . Type 2 diabetes mellitus with foot ulcer, with long-term current use of insulin ([place])   . Chronic obstructive pulmonary disease ([place])   . Subacute osteomyelitis, left ankle and foot ([place])   . Diabetic foot ulcer ([place]) 10/29/2017  . Ulcer of skin ([place]) 10/14/2017  . Decreased pedal pulses 06/10/2017  . Adjustment disorder with mixed anxiety and depressed mood 11/04/2016  . History of medication noncompliance 11/04/2016  . Unilateral primary osteoarthritis, right knee 10/22/2016  . Diabetic neuropathy ([place]) 07/14/2016  . Syncope 02/25/2016  . De Quervain's tenosynovitis, bilateral 11/01/2015  . Vitamin D deficiency 09/05/2015  . Recurrent candidiasis of vagina 09/05/2015  . Varicose veins of leg with complications 83/08/4075  .  Neuropathy, intercostal nerve 12/17/2014  . Rectal itching 11/17/2014  . Restless leg syndrome 10/17/2014  . Chronic sinusitis 07/18/2014  . Headache 07/15/2014  . Vision, loss, sudden 07/12/2014  . Urge incontinence 10/15/2013  . Encounter for chronic pain management 06/30/2013  . Low back pain 01/31/2013  . HLD (hyperlipidemia) 11/19/2012  . Chest pain 06/27/2012  . Right carpal tunnel syndrome 09/01/2011  . Bilateral knee pain 09/01/2011  . Diabetes mellitus ([place]) 05/22/2008  . Morbid obesity ([place]) 05/22/2008  . 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 Depression 05/22/2008  . Obstructive sleep apnea 05/22/2008  . Hypertension 05/22/2008  . Asthma 05/22/2008  . GERD 05/22/2008    Past Surgical History:  Procedure Laterality Date  . AMPUTATION Left 02/01/2018   Procedure: LEFT FOURTH AND 5TH TOE RAY AMPUTATION;  Surgeon: [name], MD;  Location: [facility];  Service: Orthopedics;  Laterality: Left;  . AMPUTATION Left 03/03/2018   Procedure: LEFT BELOW KNEE AMPUTATION;  Surgeon: [name], MD;  Location: [facility];  Service: Orthopedics;  Laterality: Left;  . CARPAL TUNNEL RELEASE Bilateral   . CESAREAN SECTION    . KNEE ARTHROSCOPY Right 07/17/2010  . LEFT HEART CATHETERIZATION WITH CORONARY ANGIOGRAM N/A 07/27/2012   Procedure: LEFT HEART CATHETERIZATION WITH CORONARY ANGIOGRAM;  Surgeon: Sherren Mocha, MD;  Location: [facility] CATH LAB;  Service: Cardiovascular;  Laterality: N/A;  . left knee surgery     screws she thinks  . MASS EXCISION N/A 06/29/2013   Procedure:  WIDE LOCAL EXCISION OF POSTERIOR NECK ABSCESS;  Surgeon: Ralene Ok, MD;  Location: [facility];  Service: General;  Laterality: N/A;     OB History    Gravida  2   Para  1   Term      Preterm      AB  1   Living  1     SAB  1   TAB      Ectopic      Multiple      Live Births               Home Medications    Prior to Admission medications   Medication Sig Start Date End Date  Taking? Authorizing Provider  albuterol (PROVENTIL HFA;VENTOLIN HFA) 108 (90 Base) MCG/ACT inhaler Inhale 1-2 puffs into the lungs every 6 (six) hours as needed for wheezing or shortness of breath. 01/17/18  Yes [name], MD  atorvastatin (LIPITOR) 20 MG tablet Take 1 tablet (20 mg total) by mouth daily. 03/16/18  Yes Angiulli, Lavon Paganini, PA-C  budesonide-formoterol [facility]) 160-4.5 MCG/ACT inhaler inhale 2 PUFFS into THE lungs 2 TIMES DAILY 03/16/18  Yes Angiulli, Lavon Paganini, PA-C  carvedilol (COREG) 3.125 MG tablet TAKE ONE (1) TABLET  BY MOUTH TWO (2) TIMES DAILY 03/16/18  Yes Angiulli, Lavon Paganini, PA-C  cetirizine (ZYRTEC) 10 MG tablet Take 1 tablet (10 mg total) by mouth daily. Patient taking differently: Take 10 mg by mouth daily as needed for allergies.  02/28/18  Yes [name], MD  Cholecalciferol 1000 units tablet Take 1 tablet (1,000 Units total) by mouth daily. 04/05/17  Yes [name], MD  gabapentin (NEURONTIN) 600 MG tablet Take 2 tablets (1,200 mg total) by mouth 3 (three) times daily. 03/16/18  Yes Angiulli, Lavon Paganini, PA-C  HYDROcodone-acetaminophen (NORCO/VICODIN) 5-325 MG tablet Take 1 tablet by mouth every 6 (six) hours as needed for moderate pain or severe pain. 03/16/18  Yes Angiulli, Lavon Paganini, PA-C  insulin aspart (NOVOLOG) 100 UNIT/ML FlexPen Inject 6 Units into the skin 3 (three) times daily with meals. 03/16/18  Yes Angiulli, Lavon Paganini, PA-C  insulin glargine (LANTUS) 100 unit/mL SOPN Inject 0.1 mLs (10 Units total) into the skin at bedtime. 03/16/18  Yes Angiulli, Lavon Paganini, PA-C  insulin glargine (LANTUS) 100 unit/mL SOPN Inject 0.5 mLs (50 Units total) into the skin daily. Patient taking differently: Inject 52 Units into the skin daily.  03/16/18  Yes Angiulli, Lavon Paganini, PA-C  liraglutide (VICTOZA) 18 MG/3ML SOPN INJECT 0.3 MILLILITERS (1.8MG TOTAL) INTO THE SKIN DAILY 03/16/18  Yes Angiulli, Lavon Paganini, PA-C  methocarbamol (ROBAXIN) 500 MG tablet Take 1 tablet  (500 mg total) by mouth every 6 (six) hours as needed for muscle spasms. 03/16/18  Yes Angiulli, Lavon Paganini, PA-C  nitroGLYCERIN (NITRODUR - DOSED IN MG/24 HR) 0.2 mg/hr patch Place 1 patch (0.2 mg total) onto the skin daily. 03/16/18  Yes Angiulli, Lavon Paganini, PA-C  pantoprazole (PROTONIX) 40 MG tablet Take 1 tablet (40 mg total) by mouth daily. 03/16/18  Yes Angiulli, Lavon Paganini, PA-C  polyethylene glycol (MIRALAX / GLYCOLAX) packet Take 17 g by mouth 2 (two) times daily. 03/16/18  Yes Angiulli, Lavon Paganini, PA-C  ACCU-CHEK SOFTCLIX LANCETS lancets Use as instructed to check blood sugar 3 times per day 03/16/18   Cathlyn Parsons, PA-C  Blood Glucose Monitoring Suppl (ACCU-CHEK NANO SMARTVIEW) w/Device KIT Use to check blood sugar 3 times daily 03/16/18   Angiulli, Lavon Paganini, PA-C  glucose blood (ACCU-CHEK GUIDE) test strip USE T0 CHECK BLOOD SUGAR 3 TIMES DAILY 03/16/18   Angiulli, Lavon Paganini, PA-C  oxyCODONE-acetaminophen (PERCOCET/ROXICET) 5-325 MG tablet Take 1 tablet by mouth every 6 (six) hours as needed for severe pain. 03/18/18   Lettie Czarnecki, PA-C  pentoxifylline (TRENTAL) 400 MG CR tablet Take 1 tablet (400 mg total) by mouth 3 (three) times daily with meals. Patient not taking: Reported on 03/01/2018 02/10/18   [name], MD    Family History Family History  Problem Relation Age of Onset  . Diabetes Mother   . Hyperlipidemia Mother   . Depression Mother   . Varicose Veins Mother   . Heart attack Paternal Uncle   . Heart disease Paternal Grandmother   . Heart attack Paternal Grandmother   . Heart attack Paternal Grandfather   . Heart disease Paternal Grandfather   . Heart attack Father   . Cancer Maternal Grandmother        COLON  . Hypertension Maternal Grandmother   . Hyperlipidemia Maternal Grandmother   . Diabetes Maternal Grandmother   . Other Maternal Grandfather        GUN SHOT    Social History Social History   Tobacco Use  .  Smoking status: Former Smoker    Packs/day:  0.30    Years: 0.30    Pack years: 0.09    Types: Cigarettes    Last attempt to quit: 12/06/1993    Years since quitting: 24.2  . Smokeless tobacco: Never Used  Substance Use Topics  . Alcohol use: No    Alcohol/week: 0.0 oz  . Drug use: Yes    Comment: OD attempts on home meds       Allergies   Kiwi extract; Trental [pentoxifylline]; and Nubain [nalbuphine hcl]   Review of Systems Review of Systems  Musculoskeletal: Positive for myalgias.  Skin: Positive for wound (post-op BKA).  Hematological: Does not bruise/bleed easily.  All other systems reviewed and are negative.    Physical Exam Updated Vital Signs BP 120/79   Pulse 96   Temp 99 F (37.2 C) (Oral)   Resp 20   Wt (!) 146.5 kg (323 lb)   LMP 02/17/2018   SpO2 97%   BMI 59.08 kg/m   Physical Exam  Constitutional: She is oriented to person, place, and time. She appears well-developed and well-nourished. No distress.  HENT:  Head: Normocephalic and atraumatic.  Eyes: EOM are normal.  Neck: Normal range of motion.  Cardiovascular: Normal rate, regular rhythm and intact distal pulses.  Pulmonary/Chest: Effort normal and breath sounds normal.  Abdominal: Soft. She exhibits no distension. There is no tenderness.  Musculoskeletal: She exhibits tenderness.  Neurological: She is alert and oriented to person, place, and time. No sensory deficit.  Skin: Skin is warm. There is erythema.  Mild erythema of distal leg. No significant eating.  No purulent drainage noted.  Minimal warmth.  No tenderness palpation of knee or popliteal area.  Tenderness to palpation of posterior calf  Psychiatric: She has a normal mood and affect.  Nursing note and vitals reviewed.        ED Treatments / Results  Labs (all labs ordered are listed, but only abnormal results are displayed) Labs Reviewed  COMPREHENSIVE METABOLIC PANEL - Abnormal; Notable for the following components:      Result Value   Sodium 134 (*)    Chloride  100 (*)    Glucose, Bld 232 (*)    Albumin 2.8 (*)    AST 10 (*)    ALT 11 (*)    All other components within normal limits  CBC WITH DIFFERENTIAL/PLATELET - Abnormal; Notable for the following components:   WBC 11.0 (*)    RBC 3.78 (*)    Hemoglobin 10.5 (*)    HCT 32.7 (*)    Platelets 473 (*)    All other components within normal limits  CULTURE, BLOOD (ROUTINE X 2)  CULTURE, BLOOD (ROUTINE X 2)  PROTIME-INR  URINALYSIS, ROUTINE W REFLEX MICROSCOPIC  I-STAT CG4 LACTIC ACID, ED    EKG None  Radiology Dg Knee 1-2 Views Left  Result Date: 03/18/2018 CLINICAL DATA:  Status post amputation EXAM: LEFT KNEE - 1-2 VIEW COMPARISON:  None. FINDINGS: The patient is status post amputation through the proximal left tibia and fibula. Degenerative changes in the knee. No other acute abnormalities. Vascular calcifications. IMPRESSION: Below the knee amputation. Electronically Signed   By: Dorise Bullion III M.D   On: 03/18/2018 13:27    Procedures Procedures (including critical care time)  Medications Ordered in ED Medications  cyclobenzaprine (FLEXERIL) tablet 5 mg (5 mg Oral Given 03/18/18 1301)  oxyCODONE-acetaminophen (PERCOCET/ROXICET) 5-325 MG per tablet 1 tablet (1 tablet Oral Given 03/18/18  1301)     Initial Impression / Assessment and Plan / ED Course  I have reviewed the triage vital signs and the nursing notes.  Pertinent labs & imaging results that were available during my care of the patient were reviewed by me and considered in my medical decision making (see chart for details).     Patient presenting for evaluation of worsening pain of left BKA.  Surgery on March 29.  Discharge from the hospital 2 days ago.  Pain worsened acutely at 1:00 this morning. Physical exam shows patient who is neurovascularly intact.  Minimal erythema and warmth, ?post-op vs infection.  Patient with tenderness palpation posterior calf, concern for possible DVT.  Will obtain ultrasound to rule  out DVT, x-ray to rule out further infection of the bone.  Labs show mild leukocytosis of 11.1, increased from prior.  Otherwise reassuring.  No lactate.  Patient is afebrile.  Doubt sepsis. Will give percocet and flexril for sx control.   X-ray negative for trauma or osteomyelitis.  Ultrasound negative for DVT.  On reassessment, patient reports symptoms were much improved with medications.  Discussed case with attending, Dr. Sherral Hammers agrees to plan.  Will consult with Dr. Sharol Given to ensure follow-up and ask about need for antibiotics.  Discussed plan with patient, patient does not want to wait for Dr. Jess Barters input.  She states that she will call him if she is concerned.  I discussed with her that she may have an infection that needs treatment and she may have delay of care which could have adverse effects, patient states she understands but she does not want to wait.  She wants to leave.  Will switch norco to percocet. PMP checked, pt with chronic pain mediations, new BKA and post-op pain, will given short course.  Pt has flexeril at home. Strict return precautions given. Pt to f/u with Sharol Given on Monday. Pt states she understands and agrees to plan.    Final Clinical Impressions(s) / ED Diagnoses   Final diagnoses:  Post-op pain  S/P BKA (below knee amputation) unilateral, left [facility])    ED Discharge Orders        Ordered    oxyCODONE-acetaminophen (PERCOCET/ROXICET) 5-325 MG tablet  Every 6 hours PRN     03/18/18 1637       Effie Wahlert, PA-C 03/18/18 1716    Daleen Bo, MD 03/18/18 2244

## 2018-03-18 NOTE — Progress Notes (Signed)
Preliminary notes by tech--Left lower extremity venous study completed. Negative for deep and superficial veins thrombosis from CFV to popliteal veins.  Patient's surgical site is bleeding while exam.  Result notified RN Micheal by phone.  Hongying Tashanda Fuhrer(RDMS RVT) 03/18/18 3:34 PM

## 2018-03-18 NOTE — ED Notes (Signed)
Pt refused to sign for discharge summary. Pt taken to lobby in her personal wheelchair by RN. Pt had no questions at time of discharge.

## 2018-03-20 ENCOUNTER — Telehealth: Payer: Self-pay | Admitting: Registered Nurse

## 2018-03-20 NOTE — Telephone Encounter (Signed)
Transitional Care call Transitional Care Call Completed, Appointment Confirmed, Address Confirmed, New Patient Packet Mailed  Patient name: [name]  DOB: 23-Jan-1976 1. Are you/is patient experiencing any problems since coming home? No, she was seen in the Emergency Department on 03/18/2018, note reviewed. Hydrocodone changed to Oxycodone.  a. Are there any questions regarding any aspect of care? No 2. Are there any questions regarding medications administration/dosing? No a. Are meds being taken as prescribed? Yes b. "Patient should review meds with caller to confirm"  3. Have there been any falls? No 4. Has Home Health been to the house and/or have they contacted you? No, this provider placed a call to [facility], no referral was sent to [facility]. Sent e-mail to CSW, awaiting a response. [name] is aware I will call her with an update she verbalizes understanding.  a. If not, have you tried to contact them? Reports she is unaware who to call, see above note.  b. Can we help you contact them? Yes, see above.  5. Are bowels and bladder emptying properly? yes a. Are there any unexpected incontinence issues? No b. If applicable, is patient following bowel/bladder programs? NA 6. Any fevers, problems with breathing, unexpected pain? No 7. Are there any skin problems or new areas of breakdown? No 8. Has the patient/family member arranged specialty MD follow up (ie cardiology/neurology/renal/surgical/etc.)?  She states she has scheduled all follow up appointments,  a. Can we help arrange? No 9. Does the patient need any other services or support that we can help arrange? No 10. Are caregivers following through as expected in assisting the patient? Yes 11. Has the patient quit smoking, drinking alcohol, or using drugs as recommended? [name] denies smoking, drinking alcohol or using illicit drugs.   Appointment date/time 03/23/2018, arrival time 2:20 for 2:40 appointment with Dr. Posey Pronto  at [place]

## 2018-03-22 ENCOUNTER — Encounter (INDEPENDENT_AMBULATORY_CARE_PROVIDER_SITE_OTHER): Payer: Self-pay

## 2018-03-22 ENCOUNTER — Ambulatory Visit (INDEPENDENT_AMBULATORY_CARE_PROVIDER_SITE_OTHER): Payer: Medicaid Other

## 2018-03-22 ENCOUNTER — Telehealth (INDEPENDENT_AMBULATORY_CARE_PROVIDER_SITE_OTHER): Payer: Self-pay | Admitting: Orthopedic Surgery

## 2018-03-22 VITALS — Ht 62.0 in | Wt 323.0 lb

## 2018-03-22 DIAGNOSIS — E559 Vitamin D deficiency, unspecified: Secondary | ICD-10-CM | POA: Diagnosis not present

## 2018-03-22 DIAGNOSIS — Z87891 Personal history of nicotine dependence: Secondary | ICD-10-CM | POA: Diagnosis not present

## 2018-03-22 DIAGNOSIS — F4323 Adjustment disorder with mixed anxiety and depressed mood: Secondary | ICD-10-CM | POA: Diagnosis not present

## 2018-03-22 DIAGNOSIS — M1711 Unilateral primary osteoarthritis, right knee: Secondary | ICD-10-CM | POA: Diagnosis not present

## 2018-03-22 DIAGNOSIS — G894 Chronic pain syndrome: Secondary | ICD-10-CM | POA: Diagnosis not present

## 2018-03-22 DIAGNOSIS — K219 Gastro-esophageal reflux disease without esophagitis: Secondary | ICD-10-CM | POA: Diagnosis not present

## 2018-03-22 DIAGNOSIS — F319 Bipolar disorder, unspecified: Secondary | ICD-10-CM | POA: Diagnosis not present

## 2018-03-22 DIAGNOSIS — I10 Essential (primary) hypertension: Secondary | ICD-10-CM | POA: Diagnosis not present

## 2018-03-22 DIAGNOSIS — Z792 Long term (current) use of antibiotics: Secondary | ICD-10-CM | POA: Diagnosis not present

## 2018-03-22 DIAGNOSIS — Z9181 History of falling: Secondary | ICD-10-CM | POA: Diagnosis not present

## 2018-03-22 DIAGNOSIS — E1142 Type 2 diabetes mellitus with diabetic polyneuropathy: Secondary | ICD-10-CM | POA: Diagnosis not present

## 2018-03-22 DIAGNOSIS — Z7982 Long term (current) use of aspirin: Secondary | ICD-10-CM | POA: Diagnosis not present

## 2018-03-22 DIAGNOSIS — Z452 Encounter for adjustment and management of vascular access device: Secondary | ICD-10-CM | POA: Diagnosis not present

## 2018-03-22 DIAGNOSIS — E785 Hyperlipidemia, unspecified: Secondary | ICD-10-CM | POA: Diagnosis not present

## 2018-03-22 DIAGNOSIS — J449 Chronic obstructive pulmonary disease, unspecified: Secondary | ICD-10-CM | POA: Diagnosis not present

## 2018-03-22 DIAGNOSIS — G4733 Obstructive sleep apnea (adult) (pediatric): Secondary | ICD-10-CM | POA: Diagnosis not present

## 2018-03-22 DIAGNOSIS — Z794 Long term (current) use of insulin: Secondary | ICD-10-CM | POA: Diagnosis not present

## 2018-03-22 DIAGNOSIS — Z6841 Body Mass Index (BMI) 40.0 and over, adult: Secondary | ICD-10-CM | POA: Diagnosis not present

## 2018-03-22 DIAGNOSIS — G2581 Restless legs syndrome: Secondary | ICD-10-CM | POA: Diagnosis not present

## 2018-03-22 DIAGNOSIS — Z89512 Acquired absence of left leg below knee: Secondary | ICD-10-CM

## 2018-03-22 DIAGNOSIS — T8744 Infection of amputation stump, left lower extremity: Secondary | ICD-10-CM | POA: Diagnosis not present

## 2018-03-22 MED ORDER — DOXYCYCLINE HYCLATE 100 MG PO TABS: 100.0000 mg | ORAL_TABLET | Freq: Two times a day (BID) | ORAL | 0 refills | Status: DC

## 2018-03-22 NOTE — Progress Notes (Signed)
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 She is in the office today ambulating with a wheelchair with a shrinker and rigid dressing. The pt states tht prior to her d/c from the hospital on Thursday that the wound vac was removed. The limb is swollen. The staples are intact however there is a ruptured blister along the incision line towards the medial side. There is redness around the incision and the tip of the limb. The pt states that this was not present 2 days ago. She states that she is trying to elevate her leg at home. Patient also reports that she has not checked her blood sugars because she "forgets" and that she thinks they are "probably high". Spoke with Dr. Sharol Given and he advised for the pt to start Doxycycline 100 mg bid and this was faxed to the pharmacy. Patient is to wash the leg daily with dial soap and water and to continue with gauze over the ruptured blister,  shrinker and rigid dressing.  The pt will follow up with Dr. Sharol Given in one week and call with any questions.   Naamah Boggess, [place], [employer]

## 2018-03-22 NOTE — Telephone Encounter (Signed)
Elwin Sleight, nurse with [facility] is requesting wound care orders for this patient.   CB # [phone]

## 2018-03-23 ENCOUNTER — Encounter: Payer: Medicaid Other | Attending: Physical Medicine & Rehabilitation | Admitting: Physical Medicine & Rehabilitation

## 2018-03-23 LAB — CULTURE, BLOOD (ROUTINE X 2)
Culture: NO GROWTH
Culture: NO GROWTH
Special Requests: ADEQUATE

## 2018-03-23 NOTE — Telephone Encounter (Signed)
Called and sw HHN advised pt given rx for doxy bid, wash with dial soap, shrinker 4x4 to draining area if needed. Follow up with Dr. Sharol Given next week. Will see the pt 3x q week for 1 month

## 2018-03-27 ENCOUNTER — Telehealth (INDEPENDENT_AMBULATORY_CARE_PROVIDER_SITE_OTHER): Payer: Self-pay | Admitting: Orthopedic Surgery

## 2018-03-27 NOTE — Telephone Encounter (Signed)
Marilyn-(PT) with [facility] called needing verbal orders for (PT) 1 week 6  For 6 visits. She advised patient has medicaid and not sure medicaid will pay for the 6 visits. The  Number to contact Leda Gauze is [phone]

## 2018-03-27 NOTE — Telephone Encounter (Signed)
IC and advised orders are ok, LMVM.  Also advised her to discuss with patient and definitely go for visits that medicaid covers, and to discuss with patient what ins covers and it is up to her after ins covers.

## 2018-03-28 ENCOUNTER — Telehealth: Payer: Self-pay

## 2018-03-28 ENCOUNTER — Telehealth: Payer: Self-pay | Admitting: Family Medicine

## 2018-03-28 ENCOUNTER — Telehealth (INDEPENDENT_AMBULATORY_CARE_PROVIDER_SITE_OTHER): Payer: Self-pay | Admitting: Orthopedic Surgery

## 2018-03-28 NOTE — Telephone Encounter (Signed)
Needs refill on hydrocodone 10-325.  She has an appt next week but she will run out before then. [facility] [address].  She has 10 pills left. Please advise

## 2018-03-28 NOTE — Telephone Encounter (Signed)
Leda Gauze called PT from [facility] to get orders for 2wk3, orders approval per discharge summary

## 2018-03-28 NOTE — Telephone Encounter (Signed)
Patient called and requested a return phone call.  CB#[phone].  Thank you.

## 2018-03-29 NOTE — Telephone Encounter (Signed)
Tried to call pt and message states she has a Research scientist (medical) that has not been set up yet.

## 2018-03-30 ENCOUNTER — Encounter (INDEPENDENT_AMBULATORY_CARE_PROVIDER_SITE_OTHER): Payer: Self-pay | Admitting: Orthopedic Surgery

## 2018-03-30 ENCOUNTER — Ambulatory Visit (INDEPENDENT_AMBULATORY_CARE_PROVIDER_SITE_OTHER): Payer: Medicaid Other | Admitting: Orthopedic Surgery

## 2018-03-30 DIAGNOSIS — Z89512 Acquired absence of left leg below knee: Secondary | ICD-10-CM

## 2018-03-30 MED ORDER — HYDROCODONE-ACETAMINOPHEN 5-325 MG PO TABS: 1.0000 | ORAL_TABLET | Freq: Four times a day (QID) | ORAL | 0 refills | Status: DC | PRN

## 2018-03-30 NOTE — Progress Notes (Signed)
Office Visit Note   Patient: [name]           Date of Birth: 1976/11/12           MRN: [id number] Visit Date: 03/30/2018              Requested by: [name], MD [address] [place], [place] [address] PCP: [name], MD  Chief Complaint  Patient presents with  . Left Leg - Routine Post Op, Follow-up, Pain      HPI: Patient is a 42 year old woman who presents status post left below the knee amputation.  She is 4 weeks out.  Assessment & Plan: Visit Diagnoses:  1. S/P BKA (below knee amputation), left ([place])     Plan: Harvest the staples today she is given a prescription to get a 3 extra-large stump shrinker her currently strength is too loose.  Showed her how to fold the top of the shrinker down so we will begin to her thigh.  Recommended coconut water for the muscle cramping the Flexeril is not working.  She will complete her course of antibiotics refill prescription for Vicodin.  Follow-Up Instructions: Return in about 2 weeks (around 04/13/2018).   Ortho Exam  Patient is alert, oriented, no adenopathy, well-dressed, normal affect, normal respiratory effort. Examination there is some mild ischemic changes medially there is some mild redness but there is no drainage no ascending cellulitis no signs of infection the leg is not tender to palpation.  Imaging: No results found. No images are attached to the encounter.  Labs: Lab Results  Component Value Date   HGBA1C 13.8 (H) 01/27/2018   HGBA1C 13.7 (H) 10/29/2017   HGBA1C 11.6 08/18/2017   ESRSEDRATE 42 (H) 10/29/2017   ESRSEDRATE 38 (H) 07/13/2014   CRP 2.3 (H) 10/29/2017   REPTSTATUS 03/23/2018 FINAL 03/18/2018   GRAMSTAIN NO WBC SEEN RARE GRAM POSITIVE COCCI  01/11/2018   CULT  03/18/2018    NO GROWTH 5 DAYS Performed at [facility], [place] [address] [place], [place] [address]    LABORGA SERRATIA MARCESCENS 01/11/2018    @LABSALLVALUES (HGBA1)@  There is no height or  weight on file to calculate BMI.  Orders:  No orders of the defined types were placed in this encounter.  Meds ordered this encounter  Medications  . HYDROcodone-acetaminophen (NORCO/VICODIN) 5-325 MG tablet    Sig: Take 1 tablet by mouth every 6 (six) hours as needed for moderate pain or severe pain.    Dispense:  30 tablet    Refill:  0     Procedures: No procedures performed  Clinical Data: No additional findings.  ROS:  All other systems negative, except as noted in the HPI. Review of Systems  Objective: Vital Signs: There were no vitals taken for this visit.  Specialty Comments:  No specialty comments available.  PMFS History: Patient Active Problem List   Diagnosis Date Noted  . Labile blood pressure   . Hypokalemia   . Post-op pain   . Acute blood loss anemia   . Allergic rhinitis 03/06/2018  . Amputation of left lower extremity below knee ([place]) 03/06/2018  . Unilateral complete BKA, left, sequela ([place])   . Chronic pain syndrome   . Neuropathic pain   . Poorly controlled type 2 diabetes mellitus with peripheral neuropathy ([place])   . Super-super obese ([place])   . Benign essential HTN   . Acquired absence of leg below knee ([place]) 03/03/2018  . Dehiscence of  amputation stump ([place])   . Amputated toe of left foot ([place]) 02/10/2018  . Acute osteomyelitis of ankle or foot, left ([place]) 01/31/2018  . Acute on chronic blood loss anemia   . AKI (acute kidney injury) ([place])   . Type 2 diabetes mellitus with foot ulcer, with long-term current use of insulin ([place])   . Chronic obstructive pulmonary disease ([place])   . Subacute osteomyelitis, left ankle and foot ([place])   . Diabetic foot ulcer ([place]) 10/29/2017  . Ulcer of skin ([place]) 10/14/2017  . Decreased pedal pulses 06/10/2017  . Adjustment disorder with mixed anxiety and depressed mood 11/04/2016  . History of medication noncompliance 11/04/2016  . Unilateral primary osteoarthritis, right knee 10/22/2016  . Diabetic  neuropathy ([place]) 07/14/2016  . Syncope 02/25/2016  . De Quervain's tenosynovitis, bilateral 11/01/2015  . Vitamin D deficiency 09/05/2015  . Recurrent candidiasis of vagina 09/05/2015  . Varicose veins of leg with complications 72/53/6644  . Neuropathy, intercostal nerve 12/17/2014  . Rectal itching 11/17/2014  . Restless leg syndrome 10/17/2014  . Chronic sinusitis 07/18/2014  . Headache 07/15/2014  . Vision, loss, sudden 07/12/2014  . Urge incontinence 10/15/2013  . Encounter for chronic pain management 06/30/2013  . Low back pain 01/31/2013  . HLD (hyperlipidemia) 11/19/2012  . Chest pain 06/27/2012  . Right carpal tunnel syndrome 09/01/2011  . Bilateral knee pain 09/01/2011  . Diabetes mellitus ([place]) 05/22/2008  . Morbid obesity ([place]) 05/22/2008  . 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 Depression 05/22/2008  . Obstructive sleep apnea 05/22/2008  . Hypertension 05/22/2008  . Asthma 05/22/2008  . GERD 05/22/2008   Past Medical History:  Diagnosis Date  . Acute osteomyelitis of ankle or foot, left ([place]) 01/31/2018  . Alveolar hypoventilation   . Anemia    not on iron pill  . Arthritis   . Asthma   . Bipolar 2 disorder ([place])   . Carpal tunnel syndrome on right    recurrent  . Cellulitis 08/2010-08/2011  . Cellulitis of left foot 01/27/2018  . Chronic pain   . COPD (chronic obstructive pulmonary disease) ([name])    Symbicort daily and Proventil as needed  . Costochondritis   . Depression   . Diabetes mellitus 2000   Type 2, Uncontrolled.Takes Lantus daily.Fasting blood sugar runs 150  . Dizziness    occasionally  . Drug-seeking behavior   . GERD (gastroesophageal reflux disease)    takes Pantoprazole and Zantac daily  . Headache    migraine-last one about a yr ago.Topamax daily  . HLD (hyperlipidemia)    takes Atorvastatin daily  . Hypertension    takes Lisinopril and Coreg daily  . Morbid obesity ([place])   . Muscle spasm    takes Flexeril as needed  .  Nocturia   . Obstructive sleep apnea   . Peripheral neuropathy    takes Gabapentin daily  . Pneumonia    "walking" several yrs ago and as a baby  . Rectal fissure   . Restless leg   . Urinary frequency   . Varicose veins    Right medial thigh and Left leg     Family History  Problem Relation Age of Onset  . Diabetes Mother   . Hyperlipidemia Mother   . Depression Mother   . Varicose Veins Mother   . Heart attack Paternal Uncle   . Heart disease Paternal Grandmother   . Heart attack Paternal Grandmother   . Heart attack Paternal Grandfather   . Heart disease Paternal Grandfather   .  Heart attack Father   . Cancer Maternal Grandmother        COLON  . Hypertension Maternal Grandmother   . Hyperlipidemia Maternal Grandmother   . Diabetes Maternal Grandmother   . Other Maternal Grandfather        GUN SHOT    Past Surgical History:  Procedure Laterality Date  . AMPUTATION Left 02/01/2018   Procedure: LEFT FOURTH AND 5TH TOE RAY AMPUTATION;  Surgeon: [name], MD;  Location: [facility];  Service: Orthopedics;  Laterality: Left;  . AMPUTATION Left 03/03/2018   Procedure: LEFT BELOW KNEE AMPUTATION;  Surgeon: [name], MD;  Location: [facility];  Service: Orthopedics;  Laterality: Left;  . CARPAL TUNNEL RELEASE Bilateral   . CESAREAN SECTION    . KNEE ARTHROSCOPY Right 07/17/2010  . LEFT HEART CATHETERIZATION WITH CORONARY ANGIOGRAM N/A 07/27/2012   Procedure: LEFT HEART CATHETERIZATION WITH CORONARY ANGIOGRAM;  Surgeon: Sherren Mocha, MD;  Location: [facility] CATH LAB;  Service: Cardiovascular;  Laterality: N/A;  . left knee surgery     screws she thinks  . MASS EXCISION N/A 06/29/2013   Procedure:  WIDE LOCAL EXCISION OF POSTERIOR NECK ABSCESS;  Surgeon: Ralene Ok, MD;  Location: [facility];  Service: General;  Laterality: N/A;   Social History   Occupational History  . Not on file  Tobacco Use  . Smoking status: Former Smoker    Packs/day: 0.30    Years: 0.30    Pack years:  0.09    Types: Cigarettes    Last attempt to quit: 12/06/1993    Years since quitting: 24.3  . Smokeless tobacco: Never Used  Substance and Sexual Activity  . Alcohol use: No    Alcohol/week: 0.0 oz  . Drug use: Yes    Comment: OD attempts on home meds    . Sexual activity: Yes    Partners: Male

## 2018-03-30 NOTE — Telephone Encounter (Signed)
Pt in office this afternoon

## 2018-03-31 ENCOUNTER — Telehealth (INDEPENDENT_AMBULATORY_CARE_PROVIDER_SITE_OTHER): Payer: Self-pay

## 2018-03-31 NOTE — Telephone Encounter (Signed)
Patient called stating that when she removed her dressing from her left leg, that she smelled the bandage and that bandage had a little odor to it.  Stated that she is already on antibiotics, but she wanted to let Dr. Sharol Given know.  Would like a call back.  Cb# is [phone].  Please advise.  Thank you.

## 2018-03-31 NOTE — Telephone Encounter (Signed)
Can you open an appt time for this pt on Monday at 3:30/ she is on abx and will continue with dressing changes she is concerned about a slight odor no other s/s besides drainage and tht is the same not any more.

## 2018-04-03 ENCOUNTER — Encounter (INDEPENDENT_AMBULATORY_CARE_PROVIDER_SITE_OTHER): Payer: Self-pay | Admitting: Orthopedic Surgery

## 2018-04-03 ENCOUNTER — Ambulatory Visit (INDEPENDENT_AMBULATORY_CARE_PROVIDER_SITE_OTHER): Payer: Medicaid Other | Admitting: Orthopedic Surgery

## 2018-04-03 DIAGNOSIS — Z89512 Acquired absence of left leg below knee: Secondary | ICD-10-CM

## 2018-04-03 NOTE — Progress Notes (Signed)
Office Visit Note   Patient: [name]           Date of Birth: October 23, 1976           MRN: [id number] Visit Date: 04/03/2018              Requested by: [name], MD [address] [place], [place] [address] PCP: [name], MD  Chief Complaint  Patient presents with  . Left Leg - Pain      HPI: Patient is a 42 year old woman who presents status post left transtibial amputation with some mild dehiscence medially.  Assessment & Plan: Visit Diagnoses:  1. S/P BKA (below knee amputation), left ([place])     Plan: We will recommend washing with soap and water and using antibiotic ointment plus gauze plus her stump shrinker.  Washing change daily.  Whey-based protein 3 times a day with meals.  Follow-Up Instructions: Return in about 1 week (around 04/10/2018).   Ortho Exam  Patient is alert, oriented, no adenopathy, well-dressed, normal affect, normal respiratory effort. Examination patient has about 5 mm of redness over the medial wound dehiscence the area of dehiscence is about 5 x 15 mm with black eschar after informed consent a 10 blade knife was used to remove most of the black eschar.  There is also retained suture knot and this was removed as well.  There is no deep abscess or purulence.  Patient is currently on doxycycline Iodosorb plus gauze plus her stump shrinker was reapplied.  The importance of protein supplements was discussed and recommended  Imaging: No results found. No images are attached to the encounter.  Labs: Lab Results  Component Value Date   HGBA1C 13.8 (H) 01/27/2018   HGBA1C 13.7 (H) 10/29/2017   HGBA1C 11.6 08/18/2017   ESRSEDRATE 42 (H) 10/29/2017   ESRSEDRATE 38 (H) 07/13/2014   CRP 2.3 (H) 10/29/2017   REPTSTATUS 03/23/2018 FINAL 03/18/2018   GRAMSTAIN NO WBC SEEN RARE GRAM POSITIVE COCCI  01/11/2018   CULT  03/18/2018    NO GROWTH 5 DAYS Performed at [facility], [place] [address], [place], [place]  [address]    LABORGA SERRATIA MARCESCENS 01/11/2018    @LABSALLVALUES (HGBA1)@  There is no height or weight on file to calculate BMI.  Orders:  No orders of the defined types were placed in this encounter.  No orders of the defined types were placed in this encounter.    Procedures: No procedures performed  Clinical Data: No additional findings.  ROS:  All other systems negative, except as noted in the HPI. Review of Systems  Objective: Vital Signs: There were no vitals taken for this visit.  Specialty Comments:  No specialty comments available.  PMFS History: Patient Active Problem List   Diagnosis Date Noted  . Labile blood pressure   . Hypokalemia   . Post-op pain   . Acute blood loss anemia   . Allergic rhinitis 03/06/2018  . Amputation of left lower extremity below knee ([place]) 03/06/2018  . Unilateral complete BKA, left, sequela ([place])   . Chronic pain syndrome   . Neuropathic pain   . Poorly controlled type 2 diabetes mellitus with peripheral neuropathy ([place])   . Super-super obese ([place])   . Benign essential HTN   . Acquired absence of leg below knee ([place]) 03/03/2018  . Dehiscence of amputation stump ([place])   . Amputated toe of left foot ([place]) 02/10/2018  . Acute osteomyelitis of ankle or foot, left ([name])  01/31/2018  . Acute on chronic blood loss anemia   . AKI (acute kidney injury) ([place])   . Type 2 diabetes mellitus with foot ulcer, with long-term current use of insulin ([place])   . Chronic obstructive pulmonary disease ([place])   . Subacute osteomyelitis, left ankle and foot ([place])   . Diabetic foot ulcer ([place]) 10/29/2017  . Ulcer of skin ([place]) 10/14/2017  . Decreased pedal pulses 06/10/2017  . Adjustment disorder with mixed anxiety and depressed mood 11/04/2016  . History of medication noncompliance 11/04/2016  . Unilateral primary osteoarthritis, right knee 10/22/2016  . Diabetic neuropathy ([place]) 07/14/2016  . Syncope 02/25/2016  . De Quervain's  tenosynovitis, bilateral 11/01/2015  . Vitamin D deficiency 09/05/2015  . Recurrent candidiasis of vagina 09/05/2015  . Varicose veins of leg with complications 74/07/1447  . Neuropathy, intercostal nerve 12/17/2014  . Rectal itching 11/17/2014  . Restless leg syndrome 10/17/2014  . Chronic sinusitis 07/18/2014  . Headache 07/15/2014  . Vision, loss, sudden 07/12/2014  . Urge incontinence 10/15/2013  . Encounter for chronic pain management 06/30/2013  . Low back pain 01/31/2013  . HLD (hyperlipidemia) 11/19/2012  . Chest pain 06/27/2012  . Right carpal tunnel syndrome 09/01/2011  . Bilateral knee pain 09/01/2011  . Diabetes mellitus ([place]) 05/22/2008  . Morbid obesity ([place]) 05/22/2008  . 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 Depression 05/22/2008  . Obstructive sleep apnea 05/22/2008  . Hypertension 05/22/2008  . Asthma 05/22/2008  . GERD 05/22/2008   Past Medical History:  Diagnosis Date  . Acute osteomyelitis of ankle or foot, left ([place]) 01/31/2018  . Alveolar hypoventilation   . Anemia    not on iron pill  . Arthritis   . Asthma   . Bipolar 2 disorder ([place])   . Carpal tunnel syndrome on right    recurrent  . Cellulitis 08/2010-08/2011  . Cellulitis of left foot 01/27/2018  . Chronic pain   . COPD (chronic obstructive pulmonary disease) ([name])    Symbicort daily and Proventil as needed  . Costochondritis   . Depression   . Diabetes mellitus 2000   Type 2, Uncontrolled.Takes Lantus daily.Fasting blood sugar runs 150  . Dizziness    occasionally  . Drug-seeking behavior   . GERD (gastroesophageal reflux disease)    takes Pantoprazole and Zantac daily  . Headache    migraine-last one about a yr ago.Topamax daily  . HLD (hyperlipidemia)    takes Atorvastatin daily  . Hypertension    takes Lisinopril and Coreg daily  . Morbid obesity ([place])   . Muscle spasm    takes Flexeril as needed  . Nocturia   . Obstructive sleep apnea   . Peripheral neuropathy     takes Gabapentin daily  . Pneumonia    "walking" several yrs ago and as a baby  . Rectal fissure   . Restless leg   . Urinary frequency   . Varicose veins    Right medial thigh and Left leg     Family History  Problem Relation Age of Onset  . Diabetes Mother   . Hyperlipidemia Mother   . Depression Mother   . Varicose Veins Mother   . Heart attack Paternal Uncle   . Heart disease Paternal Grandmother   . Heart attack Paternal Grandmother   . Heart attack Paternal Grandfather   . Heart disease Paternal Grandfather   . Heart attack Father   . Cancer Maternal Grandmother        COLON  . Hypertension  Maternal Grandmother   . Hyperlipidemia Maternal Grandmother   . Diabetes Maternal Grandmother   . Other Maternal Grandfather        GUN SHOT    Past Surgical History:  Procedure Laterality Date  . AMPUTATION Left 02/01/2018   Procedure: LEFT FOURTH AND 5TH TOE RAY AMPUTATION;  Surgeon: [name], MD;  Location: [facility];  Service: Orthopedics;  Laterality: Left;  . AMPUTATION Left 03/03/2018   Procedure: LEFT BELOW KNEE AMPUTATION;  Surgeon: [name], MD;  Location: [facility];  Service: Orthopedics;  Laterality: Left;  . CARPAL TUNNEL RELEASE Bilateral   . CESAREAN SECTION    . KNEE ARTHROSCOPY Right 07/17/2010  . LEFT HEART CATHETERIZATION WITH CORONARY ANGIOGRAM N/A 07/27/2012   Procedure: LEFT HEART CATHETERIZATION WITH CORONARY ANGIOGRAM;  Surgeon: Sherren Mocha, MD;  Location[facility] C[facility] LAB;  Service: Cardiovascular;  Laterality: N/A;  . left knee surgery     screws she thinks  . MASS EXCISION N/A 06/29/2013   Procedure:  WIDE LOCAL EXCISION OF POSTERIOR NECK ABSCESS;  Surgeon: Ralene Ok, MD;  Location: [facility];  Service: General;  Laterality: N/A;   Social History   Occupational History  . Not on file  Tobacco Use  . Smoking status: Former Smoker    Packs/day: 0.30    Years: 0.30    Pack years: 0.09    Types: Cigarettes    Last attempt to quit: 12/06/1993     Years since quitting: 24.3  . Smokeless tobacco: Never Used  Substance and Sexual Activity  . Alcohol use: No    Alcohol/week: 0.0 oz  . Drug use: Yes    Comment: OD attempts on home meds    . Sexual activity: Yes    Partners: Male

## 2018-04-05 ENCOUNTER — Ambulatory Visit: Payer: Medicaid Other | Admitting: Internal Medicine

## 2018-04-05 DIAGNOSIS — J9620 Acute and chronic respiratory failure, unspecified whether with hypoxia or hypercapnia: Secondary | ICD-10-CM | POA: Diagnosis not present

## 2018-04-05 DIAGNOSIS — G4733 Obstructive sleep apnea (adult) (pediatric): Secondary | ICD-10-CM | POA: Diagnosis not present

## 2018-04-05 DIAGNOSIS — E669 Obesity, unspecified: Secondary | ICD-10-CM | POA: Diagnosis not present

## 2018-04-05 DIAGNOSIS — J45909 Unspecified asthma, uncomplicated: Secondary | ICD-10-CM | POA: Diagnosis not present

## 2018-04-05 DIAGNOSIS — M86172 Other acute osteomyelitis, left ankle and foot: Secondary | ICD-10-CM | POA: Diagnosis not present

## 2018-04-06 ENCOUNTER — Telehealth (INDEPENDENT_AMBULATORY_CARE_PROVIDER_SITE_OTHER): Payer: Self-pay

## 2018-04-06 ENCOUNTER — Telehealth (INDEPENDENT_AMBULATORY_CARE_PROVIDER_SITE_OTHER): Payer: Self-pay | Admitting: Orthopedic Surgery

## 2018-04-06 NOTE — Telephone Encounter (Signed)
Called pt and the phone rang without answer. Mail box is not set up yet. I will try again later.

## 2018-04-06 NOTE — Telephone Encounter (Signed)
Pt calling again about this medication refill, it looks like the message may not have been routed the previous time the pt called. Please advise

## 2018-04-06 NOTE — Telephone Encounter (Signed)
Patient would like a call back concerning her left leg.  Cb# is [phone].  Please advise.  Thank you.

## 2018-04-07 DIAGNOSIS — T8781 Dehiscence of amputation stump: Secondary | ICD-10-CM | POA: Diagnosis not present

## 2018-04-07 DIAGNOSIS — E119 Type 2 diabetes mellitus without complications: Secondary | ICD-10-CM | POA: Diagnosis not present

## 2018-04-07 NOTE — Telephone Encounter (Signed)
I called pt and there is no answer and voicemail box has not been set up yet.

## 2018-04-07 NOTE — Telephone Encounter (Signed)
Will continue to try an reach pt.

## 2018-04-09 DIAGNOSIS — T8740 Infection of amputation stump, unspecified extremity: Secondary | ICD-10-CM | POA: Insufficient documentation

## 2018-04-09 HISTORY — DX: Infection of amputation stump, unspecified extremity: T87.40

## 2018-04-10 NOTE — Telephone Encounter (Signed)
I called and mom answered the phone. I advised that I have been trying to get in touch with her for days that there is no answer and that the pt's voicemail has not been set up. She states that she brought the pt to [facility]. That she will not be coming back to Dr. Sharol Given that her left BKA is infected and it was infected the days she came for her appt and that "Dr. Sharol Given just told her to put A&D ointment on it and now they are having to go back in and do more surgery" I advised that I would relay the message to Dr. Sharol Given and to call if there was anything that we can do.

## 2018-04-11 ENCOUNTER — Ambulatory Visit (INDEPENDENT_AMBULATORY_CARE_PROVIDER_SITE_OTHER): Payer: Medicaid Other | Admitting: Orthopedic Surgery

## 2018-04-12 NOTE — Telephone Encounter (Signed)
Original call on 4/23 patient had 10  Pills left  Dr Sharol Given prescribed 30 pills on 4/25  Patient should have enough to last until  Dr [name] returns

## 2018-04-13 ENCOUNTER — Ambulatory Visit (INDEPENDENT_AMBULATORY_CARE_PROVIDER_SITE_OTHER): Payer: Medicaid Other | Admitting: Orthopedic Surgery

## 2018-04-13 ENCOUNTER — Ambulatory Visit: Payer: Self-pay | Admitting: Pharmacist

## 2018-04-13 DIAGNOSIS — Z5189 Encounter for other specified aftercare: Secondary | ICD-10-CM | POA: Diagnosis not present

## 2018-04-16 DIAGNOSIS — E1165 Type 2 diabetes mellitus with hyperglycemia: Secondary | ICD-10-CM | POA: Insufficient documentation

## 2018-04-16 DIAGNOSIS — Z794 Long term (current) use of insulin: Secondary | ICD-10-CM

## 2018-04-16 DIAGNOSIS — IMO0002 Reserved for concepts with insufficient information to code with codable children: Secondary | ICD-10-CM | POA: Insufficient documentation

## 2018-04-17 ENCOUNTER — Telehealth: Payer: Self-pay

## 2018-04-17 NOTE — Telephone Encounter (Signed)
Nicolette from [facility] calling for verbal orders for resumption of care for skilled nursing, lab draws and infusion therapy. Requesting VO to see pt  3x week for 1 week 2x week for 4 weeks 3 PRN visits  PT evaluation Call back for VO [phone] Wallace Cullens, RN

## 2018-04-18 NOTE — Telephone Encounter (Signed)
Nicolette with [facility] calling again for verbal orders. See message below. Wallace Cullens, RN

## 2018-04-19 ENCOUNTER — Telehealth: Payer: Self-pay | Admitting: Family Medicine

## 2018-04-19 NOTE — Telephone Encounter (Signed)
Pt has an appt 04-28-18.  Her daughte( [name]) r has an appt on 04-25-18.  Because the pt is in a wheelchair, she needs assistance coming to the dr. Her husband cannot continue to take off his job to bring her to appts.   She already has transportation arranged for her daughter on Tuesday. Is there any way dr [name] can work her in with her daughter on Tuesday?

## 2018-04-20 ENCOUNTER — Other Ambulatory Visit: Payer: Self-pay | Admitting: *Deleted

## 2018-04-20 DIAGNOSIS — G4733 Obstructive sleep apnea (adult) (pediatric): Secondary | ICD-10-CM | POA: Diagnosis not present

## 2018-04-20 DIAGNOSIS — E559 Vitamin D deficiency, unspecified: Secondary | ICD-10-CM | POA: Diagnosis not present

## 2018-04-20 DIAGNOSIS — K219 Gastro-esophageal reflux disease without esophagitis: Secondary | ICD-10-CM | POA: Diagnosis not present

## 2018-04-20 DIAGNOSIS — Z9181 History of falling: Secondary | ICD-10-CM | POA: Diagnosis not present

## 2018-04-20 DIAGNOSIS — F319 Bipolar disorder, unspecified: Secondary | ICD-10-CM | POA: Diagnosis not present

## 2018-04-20 DIAGNOSIS — T8744 Infection of amputation stump, left lower extremity: Secondary | ICD-10-CM | POA: Diagnosis not present

## 2018-04-20 DIAGNOSIS — G2581 Restless legs syndrome: Secondary | ICD-10-CM | POA: Diagnosis not present

## 2018-04-20 DIAGNOSIS — M1711 Unilateral primary osteoarthritis, right knee: Secondary | ICD-10-CM | POA: Diagnosis not present

## 2018-04-20 DIAGNOSIS — F4323 Adjustment disorder with mixed anxiety and depressed mood: Secondary | ICD-10-CM | POA: Diagnosis not present

## 2018-04-20 DIAGNOSIS — Z87891 Personal history of nicotine dependence: Secondary | ICD-10-CM | POA: Diagnosis not present

## 2018-04-20 DIAGNOSIS — Z794 Long term (current) use of insulin: Secondary | ICD-10-CM | POA: Diagnosis not present

## 2018-04-20 DIAGNOSIS — E785 Hyperlipidemia, unspecified: Secondary | ICD-10-CM | POA: Diagnosis not present

## 2018-04-20 DIAGNOSIS — Z452 Encounter for adjustment and management of vascular access device: Secondary | ICD-10-CM | POA: Diagnosis not present

## 2018-04-20 DIAGNOSIS — Z7982 Long term (current) use of aspirin: Secondary | ICD-10-CM | POA: Diagnosis not present

## 2018-04-20 DIAGNOSIS — G894 Chronic pain syndrome: Secondary | ICD-10-CM | POA: Diagnosis not present

## 2018-04-20 DIAGNOSIS — Z6841 Body Mass Index (BMI) 40.0 and over, adult: Secondary | ICD-10-CM | POA: Diagnosis not present

## 2018-04-20 DIAGNOSIS — I10 Essential (primary) hypertension: Secondary | ICD-10-CM | POA: Diagnosis not present

## 2018-04-20 DIAGNOSIS — E1142 Type 2 diabetes mellitus with diabetic polyneuropathy: Secondary | ICD-10-CM | POA: Diagnosis not present

## 2018-04-20 DIAGNOSIS — Z792 Long term (current) use of antibiotics: Secondary | ICD-10-CM | POA: Diagnosis not present

## 2018-04-20 DIAGNOSIS — J449 Chronic obstructive pulmonary disease, unspecified: Secondary | ICD-10-CM | POA: Diagnosis not present

## 2018-04-20 NOTE — Telephone Encounter (Signed)
Tilford Pillar, PT, [facility] left a message asking for verbal orders for HHPT 2week3 following recent BKA. Verbal orders given per office protocol

## 2018-04-22 NOTE — Telephone Encounter (Signed)
I checked and I had an opening on Tuesday right next to her daughter's appointment. Please let patient know I will see her at the same time as her daughter. I've scheduled the appointment.  Thanks, [name], MD

## 2018-04-24 NOTE — Telephone Encounter (Signed)
Pt informed. Deseree Blount, CMA  

## 2018-04-24 NOTE — Telephone Encounter (Signed)
Called Nicolette about these orders, as I was not aware she needed infusions or lab draws. It appears she ended up admitted at [facility] in early May and had to undergo operative washout of her stump, and was discharged on 6 weeks of antibiotics. I was not aware of any of this. They had already gotten verbal orders authorized by her doctor at [facility].  Patient has appointment to see me tomorrow. [name], MD

## 2018-04-25 ENCOUNTER — Ambulatory Visit (INDEPENDENT_AMBULATORY_CARE_PROVIDER_SITE_OTHER): Payer: Medicaid Other | Admitting: Family Medicine

## 2018-04-25 ENCOUNTER — Encounter: Payer: Self-pay | Admitting: Family Medicine

## 2018-04-25 VITALS — BP 122/80 | HR 103 | Temp 98.4°F | Wt 306.0 lb

## 2018-04-25 DIAGNOSIS — E0810 Diabetes mellitus due to underlying condition with ketoacidosis without coma: Secondary | ICD-10-CM

## 2018-04-25 DIAGNOSIS — G8929 Other chronic pain: Secondary | ICD-10-CM | POA: Diagnosis not present

## 2018-04-25 DIAGNOSIS — S88112S Complete traumatic amputation at level between knee and ankle, left lower leg, sequela: Secondary | ICD-10-CM

## 2018-04-25 DIAGNOSIS — I10 Essential (primary) hypertension: Secondary | ICD-10-CM | POA: Diagnosis not present

## 2018-04-25 LAB — POCT GLYCOSYLATED HEMOGLOBIN (HGB A1C): Hemoglobin A1C: 9.6 % — AB (ref 4.0–5.6)

## 2018-04-25 MED ORDER — HYDROCODONE-ACETAMINOPHEN 10-325 MG PO TABS: 1.0000 | ORAL_TABLET | Freq: Three times a day (TID) | ORAL | 0 refills | Status: DC | PRN

## 2018-04-25 NOTE — Progress Notes (Signed)
Date of Visit: 04/25/2018   HPI:  Patient presents for hospital follow up and routine follow up. Recently admitted at [facility] from 5/3 to 5/10 for BKA amputation infection, required surgical washout and debridement inpatient.  Stump infection - s/p debridement inpatient. Discharged on 6 weeks of IV vancomycin, ertapenem, and micafungin. Getting all these antibiotics through PICC line, doing well. States wound is looking good. Has follow up in place with surgery team at [facility] tomorrow.  Diabetes - is establishing with [facility], has appointment tomorrow. Currently taking tresiba 85 units daily and novolog 22 u three times daily.   Hypertension - was taken off carvedilol in the hospital at [facility] and switched to lisinopril 2.5mg  daily. Tolerating this well.   Chronic pain - needs refill of norco. Has been out as she was discharged on oxycodone post-hospital, but not enough to make it to this appointment. States mood is good overall, no thoughts of self harm. Coping well given all the changes in her life lately.   ROS: See HPI.  [place]: history of diabetes, hypertension, chronic pain, asthma, neuropathy, gerd  PHYSICAL EXAM: BP 122/80 (BP Location: Right Wrist, Patient Position: Sitting, Cuff Size: Normal)   Pulse (!) 103   Temp 98.4 F (36.9 C) (Oral)   Wt (!) 306 lb (138.8 kg)   SpO2 99%   BMI 55.97 kg/m  Gen: no acute distress, pleasant, cooperative HEENT: normocephalic, atraumatic, moist mucous membranes  Heart: regular rate and rhythm  Lungs: clear to auscultation bilaterally normal work of breathing  Neuro: alert, grossly nonfocal, speech normal Ext: BKA wrapped, did not unwrap  ASSESSMENT/PLAN:  Diabetes mellitus ([place]) Follow up with [facility] as scheduled. A1c 9.6 today, improved from prior. No changes to insulin regimen today since she has follow up tomorrow with endocrine.  Hypertension At goal on lisinopril  Encounter for chronic pain management [place]  Controlled Substance Database reviewed, findings are appropriate. Refill norco for 2 months. Follow up in 2 months.  Unilateral complete BKA, left, sequela ([place]) Doing well after being in hospital at [facility], getting antibiotics through home health & PICC line. Has surgical follow up in place tomorrow.  FOLLOW UP: Follow up in 2 months for chronic medical issues.  [place]. [name], [place]

## 2018-04-25 NOTE — Patient Instructions (Signed)
Great to see you again today.  Refilled hydrocodone for 2 months Follow up with me in 2 months, sooner if needed  Be well, Dr. [name]

## 2018-04-26 LAB — HM DIABETES FOOT EXAM

## 2018-04-28 ENCOUNTER — Ambulatory Visit: Payer: Self-pay | Admitting: Family Medicine

## 2018-05-01 NOTE — Assessment & Plan Note (Signed)
Follow up with [facility] endocrinology as scheduled. A1c 9.6 today, improved from prior. No changes to insulin regimen today since she has follow up tomorrow with endocrine.

## 2018-05-01 NOTE — Assessment & Plan Note (Signed)
At goal on lisinopril

## 2018-05-01 NOTE — Assessment & Plan Note (Signed)
Doing well after being in hospital at [facility], getting antibiotics through home health & PICC line. Has surgical follow up in place tomorrow.

## 2018-05-01 NOTE — Assessment & Plan Note (Signed)
>>  ASSESSMENT AND PLAN FOR INSULIN DEPENDENT TYPE 2 DIABETES MELLITUS ([place]) WRITTEN ON 05/01/2018 10:46 PM BY Ari Bernabei, [name], MD  Follow up with [place] endocrinology as scheduled. A1c 9.6 today, improved from prior. No changes to insulin regimen today since she has follow up tomorrow with endocrine.

## 2018-05-01 NOTE — Assessment & Plan Note (Signed)
[place] Controlled Substance Database reviewed, findings are appropriate. Refill norco for 2 months. Follow up in 2 months.

## 2018-05-03 ENCOUNTER — Telehealth: Payer: Self-pay

## 2018-05-03 NOTE — Telephone Encounter (Signed)
Verbal orders given to [facility]. Danley Danker, RN [facility] RN)

## 2018-05-03 NOTE — Telephone Encounter (Signed)
Tillie Rung, physical therapist, needs verbal orders:  HH PT 2x/week x 3 weeks for transfer training after amputation.  Call back is [phone].  Danley Danker, RN [name] RN)

## 2018-05-03 NOTE — Telephone Encounter (Signed)
Please authorize orders, thanks Dawn Kiper J Quintarius Ferns, MD  

## 2018-05-04 DIAGNOSIS — Z6841 Body Mass Index (BMI) 40.0 and over, adult: Secondary | ICD-10-CM | POA: Diagnosis not present

## 2018-05-04 DIAGNOSIS — Z7982 Long term (current) use of aspirin: Secondary | ICD-10-CM | POA: Diagnosis not present

## 2018-05-04 DIAGNOSIS — Z794 Long term (current) use of insulin: Secondary | ICD-10-CM | POA: Diagnosis not present

## 2018-05-04 DIAGNOSIS — G2581 Restless legs syndrome: Secondary | ICD-10-CM | POA: Diagnosis not present

## 2018-05-04 DIAGNOSIS — E559 Vitamin D deficiency, unspecified: Secondary | ICD-10-CM | POA: Diagnosis not present

## 2018-05-04 DIAGNOSIS — G4733 Obstructive sleep apnea (adult) (pediatric): Secondary | ICD-10-CM | POA: Diagnosis not present

## 2018-05-04 DIAGNOSIS — Z452 Encounter for adjustment and management of vascular access device: Secondary | ICD-10-CM | POA: Diagnosis not present

## 2018-05-04 DIAGNOSIS — E1142 Type 2 diabetes mellitus with diabetic polyneuropathy: Secondary | ICD-10-CM | POA: Diagnosis not present

## 2018-05-04 DIAGNOSIS — T8744 Infection of amputation stump, left lower extremity: Secondary | ICD-10-CM | POA: Diagnosis not present

## 2018-05-04 DIAGNOSIS — K219 Gastro-esophageal reflux disease without esophagitis: Secondary | ICD-10-CM | POA: Diagnosis not present

## 2018-05-04 DIAGNOSIS — Z792 Long term (current) use of antibiotics: Secondary | ICD-10-CM | POA: Diagnosis not present

## 2018-05-04 DIAGNOSIS — Z9181 History of falling: Secondary | ICD-10-CM | POA: Diagnosis not present

## 2018-05-04 DIAGNOSIS — E785 Hyperlipidemia, unspecified: Secondary | ICD-10-CM | POA: Diagnosis not present

## 2018-05-04 DIAGNOSIS — M1711 Unilateral primary osteoarthritis, right knee: Secondary | ICD-10-CM | POA: Diagnosis not present

## 2018-05-04 DIAGNOSIS — F4323 Adjustment disorder with mixed anxiety and depressed mood: Secondary | ICD-10-CM | POA: Diagnosis not present

## 2018-05-04 DIAGNOSIS — I10 Essential (primary) hypertension: Secondary | ICD-10-CM | POA: Diagnosis not present

## 2018-05-04 DIAGNOSIS — Z87891 Personal history of nicotine dependence: Secondary | ICD-10-CM | POA: Diagnosis not present

## 2018-05-04 DIAGNOSIS — J449 Chronic obstructive pulmonary disease, unspecified: Secondary | ICD-10-CM | POA: Diagnosis not present

## 2018-05-04 DIAGNOSIS — F319 Bipolar disorder, unspecified: Secondary | ICD-10-CM | POA: Diagnosis not present

## 2018-05-04 DIAGNOSIS — G894 Chronic pain syndrome: Secondary | ICD-10-CM | POA: Diagnosis not present

## 2018-05-05 ENCOUNTER — Ambulatory Visit: Payer: Self-pay | Admitting: Family Medicine

## 2018-05-06 DIAGNOSIS — J45909 Unspecified asthma, uncomplicated: Secondary | ICD-10-CM | POA: Diagnosis not present

## 2018-05-06 DIAGNOSIS — M86172 Other acute osteomyelitis, left ankle and foot: Secondary | ICD-10-CM | POA: Diagnosis not present

## 2018-05-06 DIAGNOSIS — J9620 Acute and chronic respiratory failure, unspecified whether with hypoxia or hypercapnia: Secondary | ICD-10-CM | POA: Diagnosis not present

## 2018-05-06 DIAGNOSIS — G4733 Obstructive sleep apnea (adult) (pediatric): Secondary | ICD-10-CM | POA: Diagnosis not present

## 2018-05-06 DIAGNOSIS — E669 Obesity, unspecified: Secondary | ICD-10-CM | POA: Diagnosis not present

## 2018-05-12 ENCOUNTER — Telehealth: Payer: Self-pay

## 2018-05-12 ENCOUNTER — Telehealth: Payer: Self-pay | Admitting: Family Medicine

## 2018-05-12 NOTE — Telephone Encounter (Signed)
Pt would like for Dr [name] to call her. She has questions about starting out patient therapy.

## 2018-05-12 NOTE — Telephone Encounter (Signed)
[place] from [place] called. [name] fell last night. No injury but pt states her PICC line is sore. PICC line is flushing fine but is sore and would like to discuss this with her Dr. Please give her a call at [phone].

## 2018-05-15 NOTE — Telephone Encounter (Signed)
Tamika (RN from [place]) given verbal orders on voicemail for cdiff testing. Lakaisha Danish, Salome Spotted, CMA

## 2018-05-15 NOTE — Telephone Encounter (Signed)
Called number provided. No answer. LVM asking her to call back.  [name], MD

## 2018-05-15 NOTE — Telephone Encounter (Signed)
Returned call to patient. She has fallen twice in the last week because her leg gives out on her. Has not injured herself and says PICC line is working well. Has gotten occasional home health physical therapy but is interested in doing outpatient physical therapy to have a consistent therapist and to get out of the house. Has had 4 different physical therapists come out so far. Has one coming today. After discussion she will see how it goes today and let me know if she wants outpatient physical therapy referral (though I'm not sure Medicaid covers this).  Also of note patient reports lots of diarrhea. At risk for c diff given prolonged IV antibiotics. She has [place] coming out shortly, asked patient to inquire with [place] about whether they can collect and run a c diff at home. I attempted to call [place] a while ago but had to leave a VM.  Patient appreciative. [name], MD

## 2018-05-18 ENCOUNTER — Telehealth: Payer: Self-pay

## 2018-05-18 NOTE — Telephone Encounter (Signed)
Please authorize verbal orders, thanks [name], MD

## 2018-05-18 NOTE — Telephone Encounter (Signed)
Olivia Mackie with [facility] home health needs orders to a nursing reassessment to change PICC line dressing until PICC is D/C'ed.  Call back is [phone]

## 2018-05-19 NOTE — Telephone Encounter (Signed)
Olivia Mackie informed of orders. Deseree Kennon Holter, CMA

## 2018-05-23 ENCOUNTER — Telehealth: Payer: Self-pay | Admitting: Family Medicine

## 2018-05-23 NOTE — Telephone Encounter (Signed)
Patient came to office stated she needs letter from pcp for her mother due to her missing school to care for daughter. See note patient brought in with request. If any question call patient at [phone].  Placed in red team folder.

## 2018-05-29 ENCOUNTER — Telehealth: Payer: Self-pay

## 2018-05-29 NOTE — Telephone Encounter (Signed)
Prior approval for Hydrocodone completed via [place] [employer].  Med approved for 05/29/18 - 11/25/18  Prior approval # [id number].  [facility] and patient informed.  Danley Danker, RN [facility] RN)

## 2018-05-29 NOTE — Telephone Encounter (Signed)
Patient called stating that [place] has sent over a PA request for her Hydrocodone. None seen.  Completed PA info in [place] for Hydrocodone.  Status pending. Will recheck status in 24 hours. Patient aware. Danley Danker, RN [facility] RN)

## 2018-05-30 DIAGNOSIS — M1711 Unilateral primary osteoarthritis, right knee: Secondary | ICD-10-CM | POA: Diagnosis not present

## 2018-05-30 DIAGNOSIS — G2581 Restless legs syndrome: Secondary | ICD-10-CM | POA: Diagnosis not present

## 2018-05-30 DIAGNOSIS — E1142 Type 2 diabetes mellitus with diabetic polyneuropathy: Secondary | ICD-10-CM | POA: Diagnosis not present

## 2018-05-30 DIAGNOSIS — G4733 Obstructive sleep apnea (adult) (pediatric): Secondary | ICD-10-CM | POA: Diagnosis not present

## 2018-05-30 DIAGNOSIS — Z794 Long term (current) use of insulin: Secondary | ICD-10-CM | POA: Diagnosis not present

## 2018-05-30 DIAGNOSIS — K219 Gastro-esophageal reflux disease without esophagitis: Secondary | ICD-10-CM | POA: Diagnosis not present

## 2018-05-30 DIAGNOSIS — F319 Bipolar disorder, unspecified: Secondary | ICD-10-CM | POA: Diagnosis not present

## 2018-05-30 DIAGNOSIS — Z792 Long term (current) use of antibiotics: Secondary | ICD-10-CM | POA: Diagnosis not present

## 2018-05-30 DIAGNOSIS — I10 Essential (primary) hypertension: Secondary | ICD-10-CM | POA: Diagnosis not present

## 2018-05-30 DIAGNOSIS — Z7982 Long term (current) use of aspirin: Secondary | ICD-10-CM | POA: Diagnosis not present

## 2018-05-30 DIAGNOSIS — T8744 Infection of amputation stump, left lower extremity: Secondary | ICD-10-CM | POA: Diagnosis not present

## 2018-05-30 DIAGNOSIS — Z6841 Body Mass Index (BMI) 40.0 and over, adult: Secondary | ICD-10-CM | POA: Diagnosis not present

## 2018-05-30 DIAGNOSIS — Z9181 History of falling: Secondary | ICD-10-CM | POA: Diagnosis not present

## 2018-05-30 DIAGNOSIS — G894 Chronic pain syndrome: Secondary | ICD-10-CM | POA: Diagnosis not present

## 2018-05-30 DIAGNOSIS — Z452 Encounter for adjustment and management of vascular access device: Secondary | ICD-10-CM | POA: Diagnosis not present

## 2018-05-30 DIAGNOSIS — E559 Vitamin D deficiency, unspecified: Secondary | ICD-10-CM | POA: Diagnosis not present

## 2018-05-30 DIAGNOSIS — Z87891 Personal history of nicotine dependence: Secondary | ICD-10-CM | POA: Diagnosis not present

## 2018-05-30 DIAGNOSIS — J449 Chronic obstructive pulmonary disease, unspecified: Secondary | ICD-10-CM | POA: Diagnosis not present

## 2018-05-30 DIAGNOSIS — F4323 Adjustment disorder with mixed anxiety and depressed mood: Secondary | ICD-10-CM | POA: Diagnosis not present

## 2018-05-30 DIAGNOSIS — E785 Hyperlipidemia, unspecified: Secondary | ICD-10-CM | POA: Diagnosis not present

## 2018-06-02 ENCOUNTER — Telehealth: Payer: Self-pay | Admitting: *Deleted

## 2018-06-02 NOTE — Telephone Encounter (Signed)
Please authorize this order, thanks! [name], MD

## 2018-06-02 NOTE — Telephone Encounter (Signed)
Home health RN informed and wanted to report to the doctor that pt fell trying to get to the beside commode and husband/brother in law helped her up. She did complain of some knee pain after the fall but shad she was ok. Deseree Kennon Holter, CMA

## 2018-06-02 NOTE — Telephone Encounter (Signed)
Home Health RN calling to obtain verbal order for disease and medication management.  Reassessed patient today and she has finished antibiotic.  Would like verbal order to continue visits (once a week x 8 weeks and 2 PRN visits).  Will route request to PCP and call RN back.  Burna Forts, BSN, RN-BC

## 2018-06-05 DIAGNOSIS — J9620 Acute and chronic respiratory failure, unspecified whether with hypoxia or hypercapnia: Secondary | ICD-10-CM | POA: Diagnosis not present

## 2018-06-05 DIAGNOSIS — Z6841 Body Mass Index (BMI) 40.0 and over, adult: Secondary | ICD-10-CM | POA: Diagnosis not present

## 2018-06-05 DIAGNOSIS — J45909 Unspecified asthma, uncomplicated: Secondary | ICD-10-CM | POA: Diagnosis not present

## 2018-06-05 DIAGNOSIS — G4733 Obstructive sleep apnea (adult) (pediatric): Secondary | ICD-10-CM | POA: Diagnosis not present

## 2018-06-05 DIAGNOSIS — M86172 Other acute osteomyelitis, left ankle and foot: Secondary | ICD-10-CM | POA: Diagnosis not present

## 2018-06-05 DIAGNOSIS — M2550 Pain in unspecified joint: Secondary | ICD-10-CM | POA: Diagnosis not present

## 2018-06-05 DIAGNOSIS — E669 Obesity, unspecified: Secondary | ICD-10-CM | POA: Diagnosis not present

## 2018-06-06 ENCOUNTER — Telehealth: Payer: Self-pay

## 2018-06-06 DIAGNOSIS — M2550 Pain in unspecified joint: Secondary | ICD-10-CM | POA: Diagnosis not present

## 2018-06-06 DIAGNOSIS — Z6841 Body Mass Index (BMI) 40.0 and over, adult: Secondary | ICD-10-CM | POA: Diagnosis not present

## 2018-06-06 NOTE — Telephone Encounter (Signed)
Forward:  Pt called front desk to check the status on the letter for her mother's school. She needs it due to missing school to care for daughter. Pt would like Dr [name] to call her.   Thanks .[name], CMA

## 2018-06-06 NOTE — Telephone Encounter (Signed)
Pt called to check the status on the letter for her mother's school. She needs it due to missing school to care for daughter. Pt would like Dr [name] to call her.

## 2018-06-07 DIAGNOSIS — Z6841 Body Mass Index (BMI) 40.0 and over, adult: Secondary | ICD-10-CM | POA: Diagnosis not present

## 2018-06-07 DIAGNOSIS — M2550 Pain in unspecified joint: Secondary | ICD-10-CM | POA: Diagnosis not present

## 2018-06-07 NOTE — Telephone Encounter (Signed)
Called patient to discuss. No answer. LVM asking her to call back. [name], MD

## 2018-06-08 DIAGNOSIS — M2550 Pain in unspecified joint: Secondary | ICD-10-CM | POA: Diagnosis not present

## 2018-06-08 DIAGNOSIS — Z6841 Body Mass Index (BMI) 40.0 and over, adult: Secondary | ICD-10-CM | POA: Diagnosis not present

## 2018-06-09 DIAGNOSIS — M2550 Pain in unspecified joint: Secondary | ICD-10-CM | POA: Diagnosis not present

## 2018-06-09 DIAGNOSIS — Z6841 Body Mass Index (BMI) 40.0 and over, adult: Secondary | ICD-10-CM | POA: Diagnosis not present

## 2018-06-09 NOTE — Telephone Encounter (Signed)
Pt called and said she received a phone call Wednsday 07/03. She called back to return the call. She is checking on the status of her letter for her mothers school.

## 2018-06-09 NOTE — Telephone Encounter (Signed)
Called patient back and spoke with her. She needs a letter for her mother, [name], who is in some kind of online school and who has gotten behind on her schoolwork due to caring for [name].  The letter needs to include dates of surgery, dates of hospitalization, and information on [name] having to care for [name].  Will write letter and place at front desk. [name]'s mom will pick it up today. [name] was appreciative. [name], MD

## 2018-06-10 DIAGNOSIS — Z6841 Body Mass Index (BMI) 40.0 and over, adult: Secondary | ICD-10-CM | POA: Diagnosis not present

## 2018-06-10 DIAGNOSIS — M2550 Pain in unspecified joint: Secondary | ICD-10-CM | POA: Diagnosis not present

## 2018-06-11 DIAGNOSIS — Z6841 Body Mass Index (BMI) 40.0 and over, adult: Secondary | ICD-10-CM | POA: Diagnosis not present

## 2018-06-11 DIAGNOSIS — M2550 Pain in unspecified joint: Secondary | ICD-10-CM | POA: Diagnosis not present

## 2018-06-12 DIAGNOSIS — M2550 Pain in unspecified joint: Secondary | ICD-10-CM | POA: Diagnosis not present

## 2018-06-12 DIAGNOSIS — Z6841 Body Mass Index (BMI) 40.0 and over, adult: Secondary | ICD-10-CM | POA: Diagnosis not present

## 2018-06-13 DIAGNOSIS — F319 Bipolar disorder, unspecified: Secondary | ICD-10-CM | POA: Diagnosis not present

## 2018-06-13 DIAGNOSIS — Z9181 History of falling: Secondary | ICD-10-CM | POA: Diagnosis not present

## 2018-06-13 DIAGNOSIS — G4733 Obstructive sleep apnea (adult) (pediatric): Secondary | ICD-10-CM | POA: Diagnosis not present

## 2018-06-13 DIAGNOSIS — Z7982 Long term (current) use of aspirin: Secondary | ICD-10-CM | POA: Diagnosis not present

## 2018-06-13 DIAGNOSIS — E559 Vitamin D deficiency, unspecified: Secondary | ICD-10-CM | POA: Diagnosis not present

## 2018-06-13 DIAGNOSIS — E785 Hyperlipidemia, unspecified: Secondary | ICD-10-CM | POA: Diagnosis not present

## 2018-06-13 DIAGNOSIS — J449 Chronic obstructive pulmonary disease, unspecified: Secondary | ICD-10-CM | POA: Diagnosis not present

## 2018-06-13 DIAGNOSIS — F4323 Adjustment disorder with mixed anxiety and depressed mood: Secondary | ICD-10-CM | POA: Diagnosis not present

## 2018-06-13 DIAGNOSIS — M1711 Unilateral primary osteoarthritis, right knee: Secondary | ICD-10-CM | POA: Diagnosis not present

## 2018-06-13 DIAGNOSIS — G2581 Restless legs syndrome: Secondary | ICD-10-CM | POA: Diagnosis not present

## 2018-06-13 DIAGNOSIS — E1142 Type 2 diabetes mellitus with diabetic polyneuropathy: Secondary | ICD-10-CM | POA: Diagnosis not present

## 2018-06-13 DIAGNOSIS — Z792 Long term (current) use of antibiotics: Secondary | ICD-10-CM | POA: Diagnosis not present

## 2018-06-13 DIAGNOSIS — M2550 Pain in unspecified joint: Secondary | ICD-10-CM | POA: Diagnosis not present

## 2018-06-13 DIAGNOSIS — G894 Chronic pain syndrome: Secondary | ICD-10-CM | POA: Diagnosis not present

## 2018-06-13 DIAGNOSIS — Z6841 Body Mass Index (BMI) 40.0 and over, adult: Secondary | ICD-10-CM | POA: Diagnosis not present

## 2018-06-13 DIAGNOSIS — Z794 Long term (current) use of insulin: Secondary | ICD-10-CM | POA: Diagnosis not present

## 2018-06-13 DIAGNOSIS — I10 Essential (primary) hypertension: Secondary | ICD-10-CM | POA: Diagnosis not present

## 2018-06-13 DIAGNOSIS — Z452 Encounter for adjustment and management of vascular access device: Secondary | ICD-10-CM | POA: Diagnosis not present

## 2018-06-13 DIAGNOSIS — T8744 Infection of amputation stump, left lower extremity: Secondary | ICD-10-CM | POA: Diagnosis not present

## 2018-06-13 DIAGNOSIS — Z87891 Personal history of nicotine dependence: Secondary | ICD-10-CM | POA: Diagnosis not present

## 2018-06-13 DIAGNOSIS — K219 Gastro-esophageal reflux disease without esophagitis: Secondary | ICD-10-CM | POA: Diagnosis not present

## 2018-06-14 DIAGNOSIS — M2550 Pain in unspecified joint: Secondary | ICD-10-CM | POA: Diagnosis not present

## 2018-06-14 DIAGNOSIS — Z6841 Body Mass Index (BMI) 40.0 and over, adult: Secondary | ICD-10-CM | POA: Diagnosis not present

## 2018-06-15 DIAGNOSIS — M2550 Pain in unspecified joint: Secondary | ICD-10-CM | POA: Diagnosis not present

## 2018-06-15 DIAGNOSIS — Z6841 Body Mass Index (BMI) 40.0 and over, adult: Secondary | ICD-10-CM | POA: Diagnosis not present

## 2018-06-16 DIAGNOSIS — Z87891 Personal history of nicotine dependence: Secondary | ICD-10-CM | POA: Diagnosis not present

## 2018-06-16 DIAGNOSIS — M2550 Pain in unspecified joint: Secondary | ICD-10-CM | POA: Diagnosis not present

## 2018-06-16 DIAGNOSIS — Z6841 Body Mass Index (BMI) 40.0 and over, adult: Secondary | ICD-10-CM | POA: Diagnosis not present

## 2018-06-16 DIAGNOSIS — Z89522 Acquired absence of left knee: Secondary | ICD-10-CM | POA: Diagnosis not present

## 2018-06-16 DIAGNOSIS — Z7689 Persons encountering health services in other specified circumstances: Secondary | ICD-10-CM | POA: Diagnosis not present

## 2018-06-16 DIAGNOSIS — T8744 Infection of amputation stump, left lower extremity: Secondary | ICD-10-CM | POA: Diagnosis not present

## 2018-06-17 DIAGNOSIS — M2550 Pain in unspecified joint: Secondary | ICD-10-CM | POA: Diagnosis not present

## 2018-06-17 DIAGNOSIS — Z6841 Body Mass Index (BMI) 40.0 and over, adult: Secondary | ICD-10-CM | POA: Diagnosis not present

## 2018-06-18 DIAGNOSIS — M2550 Pain in unspecified joint: Secondary | ICD-10-CM | POA: Diagnosis not present

## 2018-06-18 DIAGNOSIS — Z6841 Body Mass Index (BMI) 40.0 and over, adult: Secondary | ICD-10-CM | POA: Diagnosis not present

## 2018-06-19 DIAGNOSIS — M2550 Pain in unspecified joint: Secondary | ICD-10-CM | POA: Diagnosis not present

## 2018-06-19 DIAGNOSIS — Z6841 Body Mass Index (BMI) 40.0 and over, adult: Secondary | ICD-10-CM | POA: Diagnosis not present

## 2018-06-20 ENCOUNTER — Ambulatory Visit (INDEPENDENT_AMBULATORY_CARE_PROVIDER_SITE_OTHER): Payer: Medicaid Other | Admitting: Family Medicine

## 2018-06-20 ENCOUNTER — Encounter: Payer: Self-pay | Admitting: Family Medicine

## 2018-06-20 DIAGNOSIS — G8929 Other chronic pain: Secondary | ICD-10-CM

## 2018-06-20 DIAGNOSIS — F329 Major depressive disorder, single episode, unspecified: Secondary | ICD-10-CM

## 2018-06-20 DIAGNOSIS — S88112S Complete traumatic amputation at level between knee and ankle, left lower leg, sequela: Secondary | ICD-10-CM | POA: Diagnosis not present

## 2018-06-20 DIAGNOSIS — M2550 Pain in unspecified joint: Secondary | ICD-10-CM | POA: Diagnosis not present

## 2018-06-20 DIAGNOSIS — E1142 Type 2 diabetes mellitus with diabetic polyneuropathy: Secondary | ICD-10-CM

## 2018-06-20 DIAGNOSIS — F32A Depression, unspecified: Secondary | ICD-10-CM

## 2018-06-20 DIAGNOSIS — Z6841 Body Mass Index (BMI) 40.0 and over, adult: Secondary | ICD-10-CM | POA: Diagnosis not present

## 2018-06-20 MED ORDER — GABAPENTIN 600 MG PO TABS: 1200.0000 mg | ORAL_TABLET | Freq: Three times a day (TID) | ORAL | 5 refills | Status: DC

## 2018-06-20 MED ORDER — HYDROCODONE-ACETAMINOPHEN 10-325 MG PO TABS: 1.0000 | ORAL_TABLET | Freq: Three times a day (TID) | ORAL | 0 refills | Status: DC | PRN

## 2018-06-20 MED ORDER — NYSTATIN 100000 UNIT/GM EX POWD: Freq: Four times a day (QID) | CUTANEOUS | 2 refills | Status: DC

## 2018-06-20 MED ORDER — NYSTATIN 100000 UNIT/GM EX CREA: 1.0000 "application " | TOPICAL_CREAM | Freq: Two times a day (BID) | CUTANEOUS | 2 refills | Status: DC

## 2018-06-20 NOTE — Progress Notes (Signed)
Date of Visit: 06/20/2018   HPI:  [name] presents for routine follow up.  Pain medication refill - needs norco refilled. Storing it safely. Takes 10-325mg  three times daily with adequate relief of pain. Pain has been increased lately due to post-operative changes (recent amputations earlier in the spring). No unwanted side effects. Denies SI/HI but admits mood has been low with all the changes after her health issues.  Falls - Has upcoming appointment on 7/24 with prosthesis clinic in [place]. Has fallen at home several times when she gets up on her own and doesn't rely on family for help, because her R leg is weak. Usually it's when she is transferring to the bedside commode from wheelchair using a walker. Knows she is supposed to use family for help with moving around but gets frustrated with being dependent and sometimes does it on her own, which is when she has fallen. Has had home health physical therapy but has not found it very helpful, also was told that her physical therapist is closing out her case. Has not had OT. Has completed all antibiotics and had PICC line removed recently.  Depression - Mood has been down, patient relates this to being dependent on her family for ADL's and losing independence. Denies SI/HI. Has not been successful with getting into [place] [place] as she relies on Medicaid transportation and says [facility] will not verbally confirm her appts with Medicaid transportation, so they will not take her. Agreeable to seeing outpatient behavioral health if we can help her get an appointment.  Skin infection - believes skin is infected in L groin and beneath panus on the R side. Area gets very moist because she is stuck sitting on the couch much of the day. Would like topical therapy to treat.  ROS: See HPI.  [place]: history of diabetes, hypertension, chronic pain, asthma, diabetic neuropathy, GERD, hyperlipidemia,   PHYSICAL EXAM: BP 118/80 (BP Location: Left Arm, Patient Position:  Sitting, Cuff Size: Normal)   Pulse 98   Temp 98.6 F (37 C) (Oral)   LMP 06/04/2018 (Approximate)   SpO2 99%  Gen: no acute distress, pleasant, cooperative HEENT: normocephalic, atraumatic  Skin: erythematous moist rash beneath panus on R side and in L inguinal fold. Neuro: alert, grossly nonfocal Ext: s/p L BKA. Stump well healed.  Psych: normal range of affect, occasionally tearful, well groomed, speech normal in rate and volume, normal eye contact   ASSESSMENT/PLAN:  Depression No SI/HI. Will attempt to get her an appointment scheduled with [facility] since she has had trouble establishing with psychiatry.  Encounter for chronic pain management [place] Controlled Substance Database reviewed, findings are appropriate. Benefits of therapy continue to outweigh risks. -  Refill norco x2 months. - Patient to follow up in 2 months for next refill. Will need to see another physician in our practice as I will be out on maternity leave then. Ideally should get 2 additional months rx at that time which will last her until I am back from leave.   Diabetic neuropathy ([name]) Refill gabapentin today  Unilateral complete BKA, left, sequela ([place]) Falling at home due to frustration about dependence on family. Has upcoming appointment with prosthesis clinic which should hopefully help her mobility. Will order home health OT for training with ADLs. Encouraged patient to use family for assistance to prevent falls.  Intertriginous candidiasis Clearly yeast in skin folds. Rx nystatin cream and powder. Encouraged to keep the area clean and dry as much as possible.  FOLLOW  UP: Follow up in 2 months for refills of pain medication.  [place]. [name], [place]

## 2018-06-20 NOTE — Patient Instructions (Signed)
Refilled pain medications for 2 months.  Ordering home health occupational therapy  Sent in nystatin cream and powder  Will work on behavioral medicine appointment  Follow up in 2 months with any doctor here. Be well, Dr. [name]

## 2018-06-21 DIAGNOSIS — Z6841 Body Mass Index (BMI) 40.0 and over, adult: Secondary | ICD-10-CM | POA: Diagnosis not present

## 2018-06-21 DIAGNOSIS — M2550 Pain in unspecified joint: Secondary | ICD-10-CM | POA: Diagnosis not present

## 2018-06-21 NOTE — Assessment & Plan Note (Signed)
No SI/HI. Will attempt to get her an appointment scheduled with [facility] since she has had trouble establishing with psychiatry.

## 2018-06-21 NOTE — Assessment & Plan Note (Signed)
[place] Controlled Substance Database reviewed, findings are appropriate. Benefits of therapy continue to outweigh risks. -  Refill norco x2 months. - Patient to follow up in 2 months for next refill. Will need to see another physician in our practice as I will be out on maternity leave then. Ideally should get 2 additional months rx at that time which will last her until I am back from leave.

## 2018-06-21 NOTE — Assessment & Plan Note (Signed)
Refill gabapentin today 

## 2018-06-21 NOTE — Assessment & Plan Note (Signed)
Falling at home due to frustration about dependence on family. Has upcoming appointment with prosthesis clinic which should hopefully help her mobility. Will order home health OT for training with ADLs. Encouraged patient to use family for assistance to prevent falls.

## 2018-06-22 DIAGNOSIS — Z6841 Body Mass Index (BMI) 40.0 and over, adult: Secondary | ICD-10-CM | POA: Diagnosis not present

## 2018-06-22 DIAGNOSIS — M2550 Pain in unspecified joint: Secondary | ICD-10-CM | POA: Diagnosis not present

## 2018-06-23 DIAGNOSIS — M2550 Pain in unspecified joint: Secondary | ICD-10-CM | POA: Diagnosis not present

## 2018-06-23 DIAGNOSIS — Z6841 Body Mass Index (BMI) 40.0 and over, adult: Secondary | ICD-10-CM | POA: Diagnosis not present

## 2018-06-24 DIAGNOSIS — M2550 Pain in unspecified joint: Secondary | ICD-10-CM | POA: Diagnosis not present

## 2018-06-24 DIAGNOSIS — Z6841 Body Mass Index (BMI) 40.0 and over, adult: Secondary | ICD-10-CM | POA: Diagnosis not present

## 2018-06-25 DIAGNOSIS — Z6841 Body Mass Index (BMI) 40.0 and over, adult: Secondary | ICD-10-CM | POA: Diagnosis not present

## 2018-06-25 DIAGNOSIS — M2550 Pain in unspecified joint: Secondary | ICD-10-CM | POA: Diagnosis not present

## 2018-06-26 DIAGNOSIS — M2550 Pain in unspecified joint: Secondary | ICD-10-CM | POA: Diagnosis not present

## 2018-06-26 DIAGNOSIS — Z6841 Body Mass Index (BMI) 40.0 and over, adult: Secondary | ICD-10-CM | POA: Diagnosis not present

## 2018-06-27 ENCOUNTER — Ambulatory Visit: Payer: Medicaid Other | Admitting: Sports Medicine

## 2018-06-27 ENCOUNTER — Encounter: Payer: Self-pay | Admitting: Sports Medicine

## 2018-06-27 ENCOUNTER — Telehealth (INDEPENDENT_AMBULATORY_CARE_PROVIDER_SITE_OTHER): Payer: Self-pay | Admitting: Orthopedic Surgery

## 2018-06-27 DIAGNOSIS — Z89512 Acquired absence of left leg below knee: Secondary | ICD-10-CM

## 2018-06-27 DIAGNOSIS — L03031 Cellulitis of right toe: Secondary | ICD-10-CM

## 2018-06-27 DIAGNOSIS — M2550 Pain in unspecified joint: Secondary | ICD-10-CM | POA: Diagnosis not present

## 2018-06-27 DIAGNOSIS — E0842 Diabetes mellitus due to underlying condition with diabetic polyneuropathy: Secondary | ICD-10-CM

## 2018-06-27 DIAGNOSIS — Z6841 Body Mass Index (BMI) 40.0 and over, adult: Secondary | ICD-10-CM | POA: Diagnosis not present

## 2018-06-27 MED ORDER — NEOMYCIN-POLYMYXIN-HC 3.5-10000-1 OT SOLN: OTIC | 0 refills | Status: DC

## 2018-06-27 MED ORDER — AMOXICILLIN-POT CLAVULANATE 875-125 MG PO TABS
1.0000 | ORAL_TABLET | Freq: Two times a day (BID) | ORAL | 0 refills | Status: DC
Start: 2018-06-27 — End: 2018-08-11

## 2018-06-27 NOTE — Telephone Encounter (Signed)
Patient called requesting copy of records. I advised need to sign authorization form. I mailed form to patient per her request

## 2018-06-27 NOTE — Progress Notes (Signed)
Subjective: [name] is a 42 y.o. female patient presents to office today complaining of a red, swollen Right great toenail. This has been present for a week. Patient has treated this by watching. Patient denies fever/chills/nausea/vomitting/any other related constitutional symptoms at this time. Admits a little pain when she bends the right 1st toe. Patient reports that she had L BKA March of this year and had to have it revised at [facility]. Admits that she did not check her blood sugar today. Last A1c was 9.   Patient Active Problem List   Diagnosis Date Noted  . Allergic rhinitis 03/06/2018  . Unilateral complete BKA, left, sequela ([place])   . Decreased pedal pulses 06/10/2017  . Unilateral primary osteoarthritis, right knee 10/22/2016  . Diabetic neuropathy ([place]) 07/14/2016  . Syncope 02/25/2016  . De Quervain's tenosynovitis, bilateral 11/01/2015  . Vitamin D deficiency 09/05/2015  . Recurrent candidiasis of vagina 09/05/2015  . Varicose veins of leg with complications 42/35/3614  . Restless leg syndrome 10/17/2014  . Chronic sinusitis 07/18/2014  . Headache 07/15/2014  . Urge incontinence 10/15/2013  . Encounter for chronic pain management 06/30/2013  . HLD (hyperlipidemia) 11/19/2012  . Chest pain 06/27/2012  . Right carpal tunnel syndrome 09/01/2011  . Bilateral knee pain 09/01/2011  . Diabetes mellitus ([place]) 05/22/2008  . Morbid obesity ([place]) 05/22/2008  . 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 Depression 05/22/2008  . Obstructive sleep apnea 05/22/2008  . Hypertension 05/22/2008  . Asthma 05/22/2008  . GERD 05/22/2008    Current Outpatient Medications on File Prior to Visit  Medication Sig Dispense Refill  . ACCU-CHEK SOFTCLIX LANCETS lancets Use as instructed to check blood sugar 3 times per day 100 each 12  . albuterol (PROVENTIL HFA;VENTOLIN HFA) 108 (90 Base) MCG/ACT inhaler Inhale 1-2 puffs into the lungs every 6 (six) hours as needed for wheezing or shortness  of breath. 18 g 5  . aspirin 81 MG chewable tablet Chew 81 mg by mouth daily.    Marland Kitchen atorvastatin (LIPITOR) 20 MG tablet Take 1 tablet (20 mg total) by mouth daily. 30 tablet 5  . Blood Glucose Monitoring Suppl (ACCU-CHEK NANO SMARTVIEW) w/Device KIT Use to check blood sugar 3 times daily 1 kit 0  . budesonide-formoterol (SYMBICORT) 160-4.5 MCG/ACT inhaler inhale 2 PUFFS into THE lungs 2 TIMES DAILY 10.2 g 4  . cetirizine (ZYRTEC) 10 MG tablet Take 1 tablet (10 mg total) by mouth daily. (Patient taking differently: Take 10 mg by mouth daily as needed for allergies. ) 30 tablet 11  . Cholecalciferol 1000 units tablet Take 1 tablet (1,000 Units total) by mouth daily. 90 tablet 0  . gabapentin (NEURONTIN) 600 MG tablet Take 2 tablets (1,200 mg total) by mouth 3 (three) times daily. 180 tablet 5  . glucose blood (ACCU-CHEK GUIDE) test strip USE T0 CHECK BLOOD SUGAR 3 TIMES DAILY 100 each 5  . HYDROcodone-acetaminophen (NORCO) 10-325 MG tablet Take 1 tablet by mouth 3 (three) times daily as needed. 90 tablet 0  . insulin aspart (NOVOLOG) 100 UNIT/ML FlexPen Inject 6 Units into the skin 3 (three) times daily with meals. (Patient taking differently: Inject 22 Units into the skin 3 (three) times daily with meals. Plus sliding scale) 15 mL 11  . insulin degludec (TRESIBA FLEXTOUCH) 100 UNIT/ML SOPN FlexTouch Pen Inject 85 Units into the skin daily.    Marland Kitchen lisinopril (PRINIVIL,ZESTRIL) 2.5 MG tablet Take 2.5 mg by mouth daily.    . metFORMIN (GLUCOPHAGE-XR) 500 MG 24 hr tablet Take  by mouth.    . nystatin (MYCOSTATIN/NYSTOP) powder Apply topically 4 (four) times daily. 60 g 2  . nystatin cream (MYCOSTATIN) Apply 1 application topically 2 (two) times daily. 60 g 2  . ondansetron (ZOFRAN) 4 MG tablet Take 4 mg by mouth every 8 (eight) hours as needed for nausea or vomiting.    . pantoprazole (PROTONIX) 40 MG tablet Take 1 tablet (40 mg total) by mouth daily. 30 tablet 1  . polyethylene glycol (MIRALAX / GLYCOLAX)  packet Take 17 g by mouth 2 (two) times daily. 14 each 0  . ranitidine (ZANTAC) 150 MG capsule Take by mouth.     No current facility-administered medications on file prior to visit.     Allergies  Allergen Reactions  . Kiwi Extract Shortness Of Breath and Swelling  . Trental [Pentoxifylline] Nausea And Vomiting  . Nubain [Nalbuphine Hcl] Other (See Comments)    "FEELS LIKE SOMETHING CRAWLING ON ME"    Objective:  There were no vitals filed for this visit.  General: Well developed, nourished, in no acute distress, alert and oriented x3   Dermatology: Skin is warm, dry and supple on right . Right hallux nail appears to have dry blood at proximal nail fold. (+) Erythema. (+) Edema. (-) serosanguous  drainage present. The remaining nails are mildly elongated but appear unremarkable/free from acute ingrowing at this time. There are no open sores, lesions or other signs of infection present.  Vascular: Dorsalis Pedis artery and Posterior Tibial artery pedal pulses are 1/4 on right with immedate capillary fill time. Pedal hair growth present. No lower extremity edema on right.   Neruologic: Grossly intact via light touch on right.   Musculoskeletal: Tenderness to palpation of the right hallux at the nail fold. S/p L BKA.   Assesement and Plan: Problem List Items Addressed This Visit      Endocrine   Diabetes mellitus ([place]) (Chronic)   Relevant Medications   metFORMIN (GLUCOPHAGE-XR) 500 MG 24 hr tablet   neomycin-polymyxin-hydrocortisone (CORTISPORIN) OTIC solution    Other Visit Diagnoses    Paronychia of toe, right    -  Primary   proximal nail fold right great toe   Relevant Medications   amoxicillin-clavulanate (AUGMENTIN) 875-125 MG tablet   neomycin-polymyxin-hydrocortisone (CORTISPORIN) OTIC solution   History of left below knee amputation ([place])         -Discussed treatment alternatives and plan of care for proximal nail fold paronychia at right great toe -Rx  Augmentin -Recommend epsom salt soaks and cortisporin solution daily to right great toenail -Patient was instructed to monitoring for signs of infection if worsens return to office or ER -Patient is to return in 7-10 days for follow up care/nail check or sooner if problems arise.  Landis Martins, DPM

## 2018-06-28 DIAGNOSIS — M2550 Pain in unspecified joint: Secondary | ICD-10-CM | POA: Diagnosis not present

## 2018-06-28 DIAGNOSIS — Z89512 Acquired absence of left leg below knee: Secondary | ICD-10-CM | POA: Diagnosis not present

## 2018-06-28 DIAGNOSIS — Z4781 Encounter for orthopedic aftercare following surgical amputation: Secondary | ICD-10-CM | POA: Insufficient documentation

## 2018-06-28 DIAGNOSIS — Z6841 Body Mass Index (BMI) 40.0 and over, adult: Secondary | ICD-10-CM | POA: Diagnosis not present

## 2018-06-29 DIAGNOSIS — M2550 Pain in unspecified joint: Secondary | ICD-10-CM | POA: Diagnosis not present

## 2018-06-29 DIAGNOSIS — Z6841 Body Mass Index (BMI) 40.0 and over, adult: Secondary | ICD-10-CM | POA: Diagnosis not present

## 2018-06-30 ENCOUNTER — Ambulatory Visit: Payer: Medicaid Other | Admitting: Podiatry

## 2018-06-30 DIAGNOSIS — Z6841 Body Mass Index (BMI) 40.0 and over, adult: Secondary | ICD-10-CM | POA: Diagnosis not present

## 2018-06-30 DIAGNOSIS — M2550 Pain in unspecified joint: Secondary | ICD-10-CM | POA: Diagnosis not present

## 2018-07-01 DIAGNOSIS — Z6841 Body Mass Index (BMI) 40.0 and over, adult: Secondary | ICD-10-CM | POA: Diagnosis not present

## 2018-07-01 DIAGNOSIS — M2550 Pain in unspecified joint: Secondary | ICD-10-CM | POA: Diagnosis not present

## 2018-07-02 DIAGNOSIS — Z6841 Body Mass Index (BMI) 40.0 and over, adult: Secondary | ICD-10-CM | POA: Diagnosis not present

## 2018-07-02 DIAGNOSIS — M2550 Pain in unspecified joint: Secondary | ICD-10-CM | POA: Diagnosis not present

## 2018-07-03 DIAGNOSIS — M2550 Pain in unspecified joint: Secondary | ICD-10-CM | POA: Diagnosis not present

## 2018-07-03 DIAGNOSIS — Z6841 Body Mass Index (BMI) 40.0 and over, adult: Secondary | ICD-10-CM | POA: Diagnosis not present

## 2018-07-04 ENCOUNTER — Ambulatory Visit: Payer: Medicaid Other | Admitting: Sports Medicine

## 2018-07-04 DIAGNOSIS — Z6841 Body Mass Index (BMI) 40.0 and over, adult: Secondary | ICD-10-CM | POA: Diagnosis not present

## 2018-07-04 DIAGNOSIS — M2550 Pain in unspecified joint: Secondary | ICD-10-CM | POA: Diagnosis not present

## 2018-07-05 DIAGNOSIS — M2550 Pain in unspecified joint: Secondary | ICD-10-CM | POA: Diagnosis not present

## 2018-07-05 DIAGNOSIS — Z6841 Body Mass Index (BMI) 40.0 and over, adult: Secondary | ICD-10-CM | POA: Diagnosis not present

## 2018-07-06 DIAGNOSIS — J45909 Unspecified asthma, uncomplicated: Secondary | ICD-10-CM | POA: Diagnosis not present

## 2018-07-06 DIAGNOSIS — M86172 Other acute osteomyelitis, left ankle and foot: Secondary | ICD-10-CM | POA: Diagnosis not present

## 2018-07-06 DIAGNOSIS — E669 Obesity, unspecified: Secondary | ICD-10-CM | POA: Diagnosis not present

## 2018-07-06 DIAGNOSIS — G4733 Obstructive sleep apnea (adult) (pediatric): Secondary | ICD-10-CM | POA: Diagnosis not present

## 2018-07-06 DIAGNOSIS — Z6841 Body Mass Index (BMI) 40.0 and over, adult: Secondary | ICD-10-CM | POA: Diagnosis not present

## 2018-07-06 DIAGNOSIS — J9620 Acute and chronic respiratory failure, unspecified whether with hypoxia or hypercapnia: Secondary | ICD-10-CM | POA: Diagnosis not present

## 2018-07-06 DIAGNOSIS — M2550 Pain in unspecified joint: Secondary | ICD-10-CM | POA: Diagnosis not present

## 2018-07-07 ENCOUNTER — Telehealth: Payer: Self-pay | Admitting: Family Medicine

## 2018-07-07 DIAGNOSIS — Z6841 Body Mass Index (BMI) 40.0 and over, adult: Secondary | ICD-10-CM | POA: Diagnosis not present

## 2018-07-07 DIAGNOSIS — M2550 Pain in unspecified joint: Secondary | ICD-10-CM | POA: Diagnosis not present

## 2018-07-07 NOTE — Telephone Encounter (Signed)
Red team, Can you call outpatient behavioral medicine center at [phone] and schedule an appointment for patient, and inform her of the appointment info? She has had trouble getting through to schedule an appointment and I told her we would work on that for her.  Thanks! [name], MD

## 2018-07-08 DIAGNOSIS — Z6841 Body Mass Index (BMI) 40.0 and over, adult: Secondary | ICD-10-CM | POA: Diagnosis not present

## 2018-07-08 DIAGNOSIS — M2550 Pain in unspecified joint: Secondary | ICD-10-CM | POA: Diagnosis not present

## 2018-07-09 DIAGNOSIS — Z6841 Body Mass Index (BMI) 40.0 and over, adult: Secondary | ICD-10-CM | POA: Diagnosis not present

## 2018-07-09 DIAGNOSIS — M2550 Pain in unspecified joint: Secondary | ICD-10-CM | POA: Diagnosis not present

## 2018-07-10 DIAGNOSIS — Z6841 Body Mass Index (BMI) 40.0 and over, adult: Secondary | ICD-10-CM | POA: Diagnosis not present

## 2018-07-10 DIAGNOSIS — M2550 Pain in unspecified joint: Secondary | ICD-10-CM | POA: Diagnosis not present

## 2018-07-10 NOTE — Telephone Encounter (Signed)
Attempted to call outpatient behavioral medicine twice and I was left on hold for over 20 mins. Will try again tomorrow. Deseree Kennon Holter, CMA

## 2018-07-11 DIAGNOSIS — M2550 Pain in unspecified joint: Secondary | ICD-10-CM | POA: Diagnosis not present

## 2018-07-11 DIAGNOSIS — Z6841 Body Mass Index (BMI) 40.0 and over, adult: Secondary | ICD-10-CM | POA: Diagnosis not present

## 2018-07-12 DIAGNOSIS — M2550 Pain in unspecified joint: Secondary | ICD-10-CM | POA: Diagnosis not present

## 2018-07-12 DIAGNOSIS — Z6841 Body Mass Index (BMI) 40.0 and over, adult: Secondary | ICD-10-CM | POA: Diagnosis not present

## 2018-07-13 ENCOUNTER — Encounter: Payer: Self-pay | Admitting: Family Medicine

## 2018-07-13 ENCOUNTER — Telehealth: Payer: Self-pay

## 2018-07-13 DIAGNOSIS — Z6841 Body Mass Index (BMI) 40.0 and over, adult: Secondary | ICD-10-CM | POA: Diagnosis not present

## 2018-07-13 DIAGNOSIS — M2550 Pain in unspecified joint: Secondary | ICD-10-CM | POA: Diagnosis not present

## 2018-07-13 NOTE — Telephone Encounter (Signed)
[place] with [facility], called to give information that patient has had two falls in last seven days on Sunday and Wednesday. No injuries reported.  Please call back if any questions.  [phone]  Danley Danker, RN [facility] RN)

## 2018-07-14 DIAGNOSIS — M2550 Pain in unspecified joint: Secondary | ICD-10-CM | POA: Diagnosis not present

## 2018-07-14 DIAGNOSIS — Z6841 Body Mass Index (BMI) 40.0 and over, adult: Secondary | ICD-10-CM | POA: Diagnosis not present

## 2018-07-15 DIAGNOSIS — M2550 Pain in unspecified joint: Secondary | ICD-10-CM | POA: Diagnosis not present

## 2018-07-15 DIAGNOSIS — Z6841 Body Mass Index (BMI) 40.0 and over, adult: Secondary | ICD-10-CM | POA: Diagnosis not present

## 2018-07-16 ENCOUNTER — Emergency Department (HOSPITAL_COMMUNITY)
Admission: EM | Admit: 2018-07-16 | Discharge: 2018-07-16 | Disposition: A | Discharge status: Home / Self Care | Payer: Medicaid Other | Attending: Emergency Medicine | Admitting: Emergency Medicine

## 2018-07-16 ENCOUNTER — Emergency Department (HOSPITAL_COMMUNITY): Payer: Medicaid Other

## 2018-07-16 ENCOUNTER — Other Ambulatory Visit: Payer: Self-pay

## 2018-07-16 ENCOUNTER — Encounter (HOSPITAL_COMMUNITY): Payer: Self-pay

## 2018-07-16 DIAGNOSIS — J45909 Unspecified asthma, uncomplicated: Secondary | ICD-10-CM | POA: Diagnosis not present

## 2018-07-16 DIAGNOSIS — E119 Type 2 diabetes mellitus without complications: Secondary | ICD-10-CM | POA: Diagnosis not present

## 2018-07-16 DIAGNOSIS — Z87891 Personal history of nicotine dependence: Secondary | ICD-10-CM | POA: Diagnosis not present

## 2018-07-16 DIAGNOSIS — Z6841 Body Mass Index (BMI) 40.0 and over, adult: Secondary | ICD-10-CM | POA: Diagnosis not present

## 2018-07-16 DIAGNOSIS — Z794 Long term (current) use of insulin: Secondary | ICD-10-CM | POA: Diagnosis not present

## 2018-07-16 DIAGNOSIS — Y92009 Unspecified place in unspecified non-institutional (private) residence as the place of occurrence of the external cause: Secondary | ICD-10-CM

## 2018-07-16 DIAGNOSIS — M79605 Pain in left leg: Secondary | ICD-10-CM | POA: Diagnosis not present

## 2018-07-16 DIAGNOSIS — Z7982 Long term (current) use of aspirin: Secondary | ICD-10-CM | POA: Diagnosis not present

## 2018-07-16 DIAGNOSIS — M2550 Pain in unspecified joint: Secondary | ICD-10-CM | POA: Diagnosis not present

## 2018-07-16 DIAGNOSIS — W19XXXA Unspecified fall, initial encounter: Secondary | ICD-10-CM

## 2018-07-16 DIAGNOSIS — S8992XA Unspecified injury of left lower leg, initial encounter: Secondary | ICD-10-CM | POA: Diagnosis not present

## 2018-07-16 MED ORDER — LIDOCAINE 5 % EX PTCH
1.0000 | MEDICATED_PATCH | CUTANEOUS | Status: DC
  Administered 2018-07-16: 1 via TRANSDERMAL
  Filled 2018-07-16: qty 1

## 2018-07-16 MED ORDER — KETOROLAC TROMETHAMINE 60 MG/2ML IM SOLN
60.0000 mg | Freq: Once | INTRAMUSCULAR | Status: AC
  Administered 2018-07-16: 60 mg via INTRAMUSCULAR
  Filled 2018-07-16: qty 2

## 2018-07-16 NOTE — Discharge Instructions (Signed)
X-ray today was normal. Can continue your home vicodin. If you need further pain management, you can follow-up with your primary care doctor or your orthopedist. Return here for any new/acute changes.

## 2018-07-16 NOTE — ED Provider Notes (Signed)
[facility] EMERGENCY DEPARTMENT Provider Note   CSN: [id number] Arrival date & time: 07/16/18  0217     History   Chief Complaint Chief Complaint  Patient presents with  . Fall    HPI [name] is a 42 y.o. female.  The history is provided by the patient and medical records.   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  Reports she fell twice this week.  Initially fell on Sunday injuring the top part of her stump, fell again tonight injuring the bottom part of her stump.  She denies any head injury or loss of consciousness.  Reports significant pain in her stump.  She is on home Vicodin but has not had any relief.  States amputation was performed locally here by Dr. Sharol Given but had revision at [facility] with Dr. Alcide Goodness who she follows with.    Past Medical History:  Diagnosis Date  . Acute osteomyelitis of ankle or foot, left ([place]) 01/31/2018  . Alveolar hypoventilation   . Anemia    not on iron pill  . Arthritis   . Asthma   . Bipolar 2 disorder ([place])   . Carpal tunnel syndrome on right    recurrent  . Cellulitis 08/2010-08/2011  . Cellulitis of left foot 01/27/2018  . Chronic pain   . COPD (chronic obstructive pulmonary disease) ([name])    Symbicort daily and Proventil as needed  . Costochondritis   . Depression   . Diabetes mellitus 2000   Type 2, Uncontrolled.Takes Lantus daily.Fasting blood sugar runs 150  . Dizziness    occasionally  . Drug-seeking behavior   . GERD (gastroesophageal reflux disease)    takes Pantoprazole and Zantac daily  . Headache    migraine-last one about a yr ago.Topamax daily  . HLD (hyperlipidemia)    takes Atorvastatin daily  . Hypertension    takes Lisinopril and Coreg daily  . Morbid obesity ([place])   . Muscle spasm    takes Flexeril as needed   . Nocturia   . Obstructive sleep apnea   . Peripheral neuropathy    takes Gabapentin daily  . Pneumonia    "walking" several yrs ago and as a baby  . Rectal fissure   . Restless leg   . Urinary frequency   . Varicose veins    Right medial thigh and Left leg     Patient Active Problem List   Diagnosis Date Noted  . Allergic rhinitis 03/06/2018  . Unilateral complete BKA, left, sequela ([place])   . Decreased pedal pulses 06/10/2017  . Unilateral primary osteoarthritis, right knee 10/22/2016  . Diabetic neuropathy ([place]) 07/14/2016  . Syncope 02/25/2016  . De Quervain's tenosynovitis, bilateral 11/01/2015  . Vitamin D deficiency 09/05/2015  . Recurrent candidiasis of vagina 09/05/2015  . Varicose veins of leg with complications 51/88/4166  . Restless leg syndrome 10/17/2014  . Chronic sinusitis 07/18/2014  . Headache 07/15/2014  . Urge incontinence 10/15/2013  . Encounter for chronic pain management 06/30/2013  . HLD (hyperlipidemia) 11/19/2012  . Chest pain 06/27/2012  . Right carpal tunnel syndrome 09/01/2011  . Bilateral knee pain 09/01/2011  . Diabetes mellitus ([place]) 05/22/2008  . Morbid obesity ([place]) 05/22/2008  . 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 Depression 05/22/2008  . Obstructive sleep apnea 05/22/2008  . Hypertension 05/22/2008  . Asthma 05/22/2008  .  GERD 05/22/2008    Past Surgical History:  Procedure Laterality Date  . AMPUTATION Left 02/01/2018   Procedure: LEFT FOURTH AND 5TH TOE RAY AMPUTATION;  Surgeon: [name], MD;  Location: [facility];  Service: Orthopedics;  Laterality: Left;  . AMPUTATION Left 03/03/2018   Procedure: LEFT BELOW KNEE AMPUTATION;  Surgeon: [name], MD;  Location: [facility];  Service: Orthopedics;  Laterality: Left;  . CARPAL TUNNEL RELEASE Bilateral   . CESAREAN SECTION    . KNEE ARTHROSCOPY Right 07/17/2010  . LEFT HEART CATHETERIZATION WITH CORONARY ANGIOGRAM N/A 07/27/2012   Procedure: LEFT HEART CATHETERIZATION  WITH CORONARY ANGIOGRAM;  Surgeon: Sherren Mocha, MD;  Location: [facility] CATH LAB;  Service: Cardiovascular;  Laterality: N/A;  . left knee surgery     screws she thinks  . MASS EXCISION N/A 06/29/2013   Procedure:  WIDE LOCAL EXCISION OF POSTERIOR NECK ABSCESS;  Surgeon: Ralene Ok, MD;  Location: [facility];  Service: General;  Laterality: N/A;     OB History    Gravida  2   Para  1   Term      Preterm      AB  1   Living  1     SAB  1   TAB      Ectopic      Multiple      Live Births               Home Medications    Prior to Admission medications   Medication Sig Start Date End Date Taking? Authorizing Provider  ACCU-CHEK SOFTCLIX LANCETS lancets Use as instructed to check blood sugar 3 times per day 03/16/18   Angiulli, Lavon Paganini, PA-C  albuterol (PROVENTIL HFA;VENTOLIN HFA) 108 (90 Base) MCG/ACT inhaler Inhale 1-2 puffs into the lungs every 6 (six) hours as needed for wheezing or shortness of breath. 01/17/18   [name], MD  amoxicillin-clavulanate (AUGMENTIN) 875-125 MG tablet Take 1 tablet by mouth 2 (two) times daily. 06/27/18   Landis Martins, DPM  aspirin 81 MG chewable tablet Chew 81 mg by mouth daily.    [provider]  atorvastatin (LIPITOR) 20 MG tablet Take 1 tablet (20 mg total) by mouth daily. 03/16/18   Angiulli, Lavon Paganini, PA-C  Blood Glucose Monitoring Suppl (ACCU-CHEK NANO SMARTVIEW) w/Device KIT Use to check blood sugar 3 times daily 03/16/18   Angiulli, Lavon Paganini, PA-C  budesonide-formoterol [facility]) 160-4.5 MCG/ACT inhaler inhale 2 PUFFS into THE lungs 2 TIMES DAILY 03/16/18   Angiulli, Lavon Paganini, PA-C  cetirizine (ZYRTEC) 10 MG tablet Take 1 tablet (10 mg total) by mouth daily. Patient taking differently: Take 10 mg by mouth daily as needed for allergies.  02/28/18   [name], MD  Cholecalciferol 1000 units tablet Take 1 tablet (1,000 Units total) by mouth daily. 04/05/17   [name], MD  gabapentin  (NEURONTIN) 600 MG tablet Take 2 tablets (1,200 mg total) by mouth 3 (three) times daily. 06/20/18   [name], MD  glucose blood (ACCU-CHEK GUIDE) test strip USE T0 CHECK BLOOD SUGAR 3 TIMES DAILY 03/16/18   Angiulli, Lavon Paganini, PA-C  HYDROcodone-acetaminophen (NORCO) 10-325 MG tablet Take 1 tablet by mouth 3 (three) times daily as needed. 06/20/18   [name], MD  insulin aspart (NOVOLOG) 100 UNIT/ML FlexPen Inject 6 Units into the skin 3 (three) times daily with meals. Patient taking differently: Inject 22 Units into the skin 3 (three) times daily with  meals. Plus sliding scale 03/16/18   Angiulli, Lavon Paganini, PA-C  insulin degludec (TRESIBA FLEXTOUCH) 100 UNIT/ML SOPN FlexTouch Pen Inject 85 Units into the skin daily.    [provider]  lisinopril (PRINIVIL,ZESTRIL) 2.5 MG tablet Take 2.5 mg by mouth daily.    [provider]  metFORMIN (GLUCOPHAGE-XR) 500 MG 24 hr tablet Take by mouth. 04/26/18 04/26/19  [provider]  neomycin-polymyxin-hydrocortisone (CORTISPORIN) OTIC solution Apply twice daily to toenail 06/27/18   Landis Martins, DPM  nystatin (MYCOSTATIN/NYSTOP) powder Apply topically 4 (four) times daily. 06/20/18   [name], MD  nystatin cream (MYCOSTATIN) Apply 1 application topically 2 (two) times daily. 06/20/18   [name], MD  ondansetron (ZOFRAN) 4 MG tablet Take 4 mg by mouth every 8 (eight) hours as needed for nausea or vomiting.    [provider]  pantoprazole (PROTONIX) 40 MG tablet Take 1 tablet (40 mg total) by mouth daily. 03/16/18   Angiulli, Lavon Paganini, PA-C  polyethylene glycol (MIRALAX / GLYCOLAX) packet Take 17 g by mouth 2 (two) times daily. 03/16/18   Angiulli, Lavon Paganini, PA-C  ranitidine (ZANTAC) 150 MG capsule Take by mouth.    [provider]    Family History Family History  Problem Relation Age of Onset  . Diabetes Mother   . Hyperlipidemia Mother   . Depression Mother   .  Varicose Veins Mother   . Heart attack Paternal Uncle   . Heart disease Paternal Grandmother   . Heart attack Paternal Grandmother   . Heart attack Paternal Grandfather   . Heart disease Paternal Grandfather   . Heart attack Father   . Cancer Maternal Grandmother        COLON  . Hypertension Maternal Grandmother   . Hyperlipidemia Maternal Grandmother   . Diabetes Maternal Grandmother   . Other Maternal Grandfather        GUN SHOT    Social History Social History   Tobacco Use  . Smoking status: Former Smoker    Packs/day: 0.30    Years: 0.30    Pack years: 0.09    Types: Cigarettes    Last attempt to quit: 12/06/1993    Years since quitting: 24.6  . Smokeless tobacco: Never Used  Substance Use Topics  . Alcohol use: No    Alcohol/week: 0.0 standard drinks  . Drug use: Yes    Comment: OD attempts on home meds       Allergies   Kiwi extract; Trental [pentoxifylline]; and Nubain [nalbuphine hcl]   Review of Systems Review of Systems   Physical Exam Updated Vital Signs BP 116/60 (BP Location: Left Arm)   Pulse 89   Temp 98.3 F (36.8 C) (Oral)   Resp (!) 24   Ht '5\' 2"'$  (1.575 m)   Wt (!) 139.7 kg   SpO2 98%   BMI 56.33 kg/m   Physical Exam  Constitutional: She is oriented to person, place, and time. She appears well-developed and well-nourished.  Calm when observed down the hall, begins sobbing when I enter room Morbidly obese  HENT:  Head: Normocephalic and atraumatic.  Mouth/Throat: Oropharynx is clear and moist.  Eyes: Pupils are equal, round, and reactive to light. Conjunctivae and EOM are normal.  Neck: Normal range of motion.  Cardiovascular: Normal rate, regular rhythm and normal heart sounds.  Pulmonary/Chest: Effort normal and breath sounds normal. No stridor. No respiratory distress.  Abdominal: Soft. Bowel sounds are normal. There is no tenderness. There is  no rebound.  Musculoskeletal: Normal range of motion.  Left stump appears atraumatic  without bruising, abrasion, redness, swelling, or other deformity; moving stump well without issue  Neurological: She is alert and oriented to person, place, and time.  Skin: Skin is warm and dry.  Psychiatric: She has a normal mood and affect.  Nursing note and vitals reviewed.    ED Treatments / Results  Labs (all labs ordered are listed, but only abnormal results are displayed) Labs Reviewed - No data to display  EKG None  Radiology Dg Knee 2 Views Left  Result Date: 07/16/2018 CLINICAL DATA:  Status post fall, with pain at the patient's left below the knee amputation site. Initial encounter. EXAM: LEFT KNEE - 1-2 VIEW COMPARISON:  Left knee radiographs performed 03/18/2018 FINDINGS: There is no evidence of fracture or dislocation. The below the knee amputation is grossly unremarkable in appearance. No osseous erosions are seen. The soft tissues are grossly unremarkable in appearance, though not well assessed on radiograph. No knee joint effusion is identified. Postoperative change is noted at the proximal lateral tibia. An apparent loose body is noted at the lateral aspect of the knee. Mild marginal osteophyte formation is noted at the medial compartment. IMPRESSION: No evidence of fracture or dislocation. Below the knee amputation is grossly unremarkable in appearance. Electronically Signed   By: Garald Balding M.D.   On: 07/16/2018 03:04    Procedures Procedures (including critical care time)  Medications Ordered in ED Medications - No data to display   Initial Impression / Assessment and Plan / ED Course  I have reviewed the triage vital signs and the nursing notes.  Pertinent labs & imaging results that were available during my care of the patient were reviewed by me and considered in my medical decision making (see chart for details).  42 year old female presenting to the ED with pain of left stump after fall at home.  When observed from down the hall she is calm and  talking at bedside with her daughter, when I enter the room she began sobbing and appears somewhat histrionic.  Stump examined in full, there is no evidence of abrasion, laceration, erythema, bruising, or other deformity.  X-ray is negative.  Do not feel she needs further work-up here today.  Patient receives chronic narcotic prescriptions from her primary care doctor, she was offered Toradol and Lidoderm patch here.  She will be discharged home.  If she requires further pain management, recommended that she follow-up closely with her primary care doctor and/or her orthopedist.  She will return here for any new/acute changes.  Final Clinical Impressions(s) / ED Diagnoses   Final diagnoses:  Fall in home, initial encounter    ED Discharge Orders    None       Kathryne Hitch 07/16/18 [id number]    Palumbo, April, MD 07/16/18 [id number]

## 2018-07-16 NOTE — ED Notes (Signed)
Patient transported to X-ray 

## 2018-07-16 NOTE — ED Triage Notes (Signed)
Pt BIB [facility] for eval of fall and L stump pain to L BKA. Pt reports she had BKA completed in march and is reporting 10/10 pain to stump. Stump w/ dressing in place, no obvious bleeding, hematoma or traumatic injury. Denies striking head, denies LOC. Pt reports she tried home Vicodin w/ no relief.

## 2018-07-17 DIAGNOSIS — Z6841 Body Mass Index (BMI) 40.0 and over, adult: Secondary | ICD-10-CM | POA: Diagnosis not present

## 2018-07-17 DIAGNOSIS — M2550 Pain in unspecified joint: Secondary | ICD-10-CM | POA: Diagnosis not present

## 2018-07-18 DIAGNOSIS — M2550 Pain in unspecified joint: Secondary | ICD-10-CM | POA: Diagnosis not present

## 2018-07-18 DIAGNOSIS — Z6841 Body Mass Index (BMI) 40.0 and over, adult: Secondary | ICD-10-CM | POA: Diagnosis not present

## 2018-07-19 DIAGNOSIS — M2550 Pain in unspecified joint: Secondary | ICD-10-CM | POA: Diagnosis not present

## 2018-07-19 DIAGNOSIS — Z6841 Body Mass Index (BMI) 40.0 and over, adult: Secondary | ICD-10-CM | POA: Diagnosis not present

## 2018-07-20 DIAGNOSIS — M2550 Pain in unspecified joint: Secondary | ICD-10-CM | POA: Diagnosis not present

## 2018-07-20 DIAGNOSIS — Z6841 Body Mass Index (BMI) 40.0 and over, adult: Secondary | ICD-10-CM | POA: Diagnosis not present

## 2018-07-21 DIAGNOSIS — Z6841 Body Mass Index (BMI) 40.0 and over, adult: Secondary | ICD-10-CM | POA: Diagnosis not present

## 2018-07-21 DIAGNOSIS — M2550 Pain in unspecified joint: Secondary | ICD-10-CM | POA: Diagnosis not present

## 2018-07-22 DIAGNOSIS — Z6841 Body Mass Index (BMI) 40.0 and over, adult: Secondary | ICD-10-CM | POA: Diagnosis not present

## 2018-07-22 DIAGNOSIS — M2550 Pain in unspecified joint: Secondary | ICD-10-CM | POA: Diagnosis not present

## 2018-07-23 DIAGNOSIS — M2550 Pain in unspecified joint: Secondary | ICD-10-CM | POA: Diagnosis not present

## 2018-07-23 DIAGNOSIS — Z6841 Body Mass Index (BMI) 40.0 and over, adult: Secondary | ICD-10-CM | POA: Diagnosis not present

## 2018-07-24 DIAGNOSIS — M2550 Pain in unspecified joint: Secondary | ICD-10-CM | POA: Diagnosis not present

## 2018-07-24 DIAGNOSIS — Z6841 Body Mass Index (BMI) 40.0 and over, adult: Secondary | ICD-10-CM | POA: Diagnosis not present

## 2018-07-25 DIAGNOSIS — Z6841 Body Mass Index (BMI) 40.0 and over, adult: Secondary | ICD-10-CM | POA: Diagnosis not present

## 2018-07-25 DIAGNOSIS — M2550 Pain in unspecified joint: Secondary | ICD-10-CM | POA: Diagnosis not present

## 2018-08-02 DIAGNOSIS — M2550 Pain in unspecified joint: Secondary | ICD-10-CM | POA: Diagnosis not present

## 2018-08-02 DIAGNOSIS — Z6841 Body Mass Index (BMI) 40.0 and over, adult: Secondary | ICD-10-CM | POA: Diagnosis not present

## 2018-08-03 DIAGNOSIS — Z6841 Body Mass Index (BMI) 40.0 and over, adult: Secondary | ICD-10-CM | POA: Diagnosis not present

## 2018-08-03 DIAGNOSIS — M2550 Pain in unspecified joint: Secondary | ICD-10-CM | POA: Diagnosis not present

## 2018-08-04 DIAGNOSIS — M2550 Pain in unspecified joint: Secondary | ICD-10-CM | POA: Diagnosis not present

## 2018-08-04 DIAGNOSIS — Z6841 Body Mass Index (BMI) 40.0 and over, adult: Secondary | ICD-10-CM | POA: Diagnosis not present

## 2018-08-05 DIAGNOSIS — M2550 Pain in unspecified joint: Secondary | ICD-10-CM | POA: Diagnosis not present

## 2018-08-05 DIAGNOSIS — Z6841 Body Mass Index (BMI) 40.0 and over, adult: Secondary | ICD-10-CM | POA: Diagnosis not present

## 2018-08-06 DIAGNOSIS — M86172 Other acute osteomyelitis, left ankle and foot: Secondary | ICD-10-CM | POA: Diagnosis not present

## 2018-08-06 DIAGNOSIS — E669 Obesity, unspecified: Secondary | ICD-10-CM | POA: Diagnosis not present

## 2018-08-06 DIAGNOSIS — M2550 Pain in unspecified joint: Secondary | ICD-10-CM | POA: Diagnosis not present

## 2018-08-06 DIAGNOSIS — Z6841 Body Mass Index (BMI) 40.0 and over, adult: Secondary | ICD-10-CM | POA: Diagnosis not present

## 2018-08-06 DIAGNOSIS — J9620 Acute and chronic respiratory failure, unspecified whether with hypoxia or hypercapnia: Secondary | ICD-10-CM | POA: Diagnosis not present

## 2018-08-06 DIAGNOSIS — G4733 Obstructive sleep apnea (adult) (pediatric): Secondary | ICD-10-CM | POA: Diagnosis not present

## 2018-08-06 DIAGNOSIS — J45909 Unspecified asthma, uncomplicated: Secondary | ICD-10-CM | POA: Diagnosis not present

## 2018-08-07 DIAGNOSIS — M2550 Pain in unspecified joint: Secondary | ICD-10-CM | POA: Diagnosis not present

## 2018-08-07 DIAGNOSIS — Z6841 Body Mass Index (BMI) 40.0 and over, adult: Secondary | ICD-10-CM | POA: Diagnosis not present

## 2018-08-08 DIAGNOSIS — Z6841 Body Mass Index (BMI) 40.0 and over, adult: Secondary | ICD-10-CM | POA: Diagnosis not present

## 2018-08-08 DIAGNOSIS — M2550 Pain in unspecified joint: Secondary | ICD-10-CM | POA: Diagnosis not present

## 2018-08-09 ENCOUNTER — Other Ambulatory Visit: Payer: Self-pay

## 2018-08-09 ENCOUNTER — Ambulatory Visit: Payer: Medicaid Other | Attending: Physical Medicine & Rehabilitation | Admitting: Physical Therapy

## 2018-08-09 ENCOUNTER — Encounter: Payer: Self-pay | Admitting: Physical Therapy

## 2018-08-09 DIAGNOSIS — R296 Repeated falls: Secondary | ICD-10-CM | POA: Diagnosis not present

## 2018-08-09 DIAGNOSIS — M6281 Muscle weakness (generalized): Secondary | ICD-10-CM

## 2018-08-09 DIAGNOSIS — R293 Abnormal posture: Secondary | ICD-10-CM | POA: Diagnosis not present

## 2018-08-09 DIAGNOSIS — R2689 Other abnormalities of gait and mobility: Secondary | ICD-10-CM | POA: Diagnosis not present

## 2018-08-09 DIAGNOSIS — R2681 Unsteadiness on feet: Secondary | ICD-10-CM | POA: Diagnosis not present

## 2018-08-09 DIAGNOSIS — M2550 Pain in unspecified joint: Secondary | ICD-10-CM | POA: Diagnosis not present

## 2018-08-09 DIAGNOSIS — M25652 Stiffness of left hip, not elsewhere classified: Secondary | ICD-10-CM

## 2018-08-09 DIAGNOSIS — Z7689 Persons encountering health services in other specified circumstances: Secondary | ICD-10-CM | POA: Diagnosis not present

## 2018-08-09 DIAGNOSIS — M21371 Foot drop, right foot: Secondary | ICD-10-CM

## 2018-08-09 DIAGNOSIS — M25651 Stiffness of right hip, not elsewhere classified: Secondary | ICD-10-CM

## 2018-08-09 DIAGNOSIS — Z6841 Body Mass Index (BMI) 40.0 and over, adult: Secondary | ICD-10-CM | POA: Diagnosis not present

## 2018-08-09 NOTE — Therapy (Signed)
[facility] [address] [place], [place], [address] Phone: [phone]   Fax:  [phone]  Physical Therapy Evaluation  Patient Details  Name: [name] MRN: [id number] Date of Birth: 11/05/1976 Referring Provider: Rolena Infante, MD   Encounter Date: 08/09/2018  PT End of Session - 08/09/18 2053    Visit Number  1    Number of Visits  25    Authorization Type  Medicaid    Authorization Time Period  Pt reports used ~3 visits with HHPT    PT Start Time  1015    PT Stop Time  1100    PT Time Calculation (min)  45 min    Equipment Utilized During Treatment  Gait belt    Activity Tolerance  Patient limited by fatigue;Patient tolerated treatment well    Behavior During Therapy  [facility] for tasks assessed/performed   tearful      Past Medical History:  Diagnosis Date  . Acute osteomyelitis of ankle or foot, left ([place]) 01/31/2018  . Alveolar hypoventilation   . Anemia    not on iron pill  . Arthritis   . Asthma   . Bipolar 2 disorder ([place])   . Carpal tunnel syndrome on right    recurrent  . Cellulitis 08/2010-08/2011  . Cellulitis of left foot 01/27/2018  . Chronic pain   . COPD (chronic obstructive pulmonary disease) ([name])    Symbicort daily and Proventil as needed  . Costochondritis   . Depression   . Diabetes mellitus 2000   Type 2, Uncontrolled.Takes Lantus daily.Fasting blood sugar runs 150  . Dizziness    occasionally  . Drug-seeking behavior   . GERD (gastroesophageal reflux disease)    takes Pantoprazole and Zantac daily  . Headache    migraine-last one about a yr ago.Topamax daily  . HLD (hyperlipidemia)    takes Atorvastatin daily  . Hypertension    takes Lisinopril and Coreg daily  . Morbid obesity ([place])   . Muscle spasm    takes Flexeril as needed  . Nocturia   . Obstructive sleep apnea   . Peripheral neuropathy    takes Gabapentin daily  . Pneumonia    "walking" several yrs ago and as a baby   . Rectal fissure   . Restless leg   . Urinary frequency   . Varicose veins    Right medial thigh and Left leg     Past Surgical History:  Procedure Laterality Date  . AMPUTATION Left 02/01/2018   Procedure: LEFT FOURTH AND 5TH TOE RAY AMPUTATION;  Surgeon: [name], MD;  Location: [facility];  Service: Orthopedics;  Laterality: Left;  . AMPUTATION Left 03/03/2018   Procedure: LEFT BELOW KNEE AMPUTATION;  Surgeon: [name], MD;  Location: [facility];  Service: Orthopedics;  Laterality: Left;  . CARPAL TUNNEL RELEASE Bilateral   . CESAREAN SECTION    . KNEE ARTHROSCOPY Right 07/17/2010  . LEFT HEART CATHETERIZATION WITH CORONARY ANGIOGRAM N/A 07/27/2012   Procedure: LEFT HEART CATHETERIZATION WITH CORONARY ANGIOGRAM;  Surgeon: Sherren Mocha, MD;  Location: [facility] CATH LAB;  Service: Cardiovascular;  Laterality: N/A;  . left knee surgery     screws she thinks  . MASS EXCISION N/A 06/29/2013   Procedure:  WIDE LOCAL EXCISION OF POSTERIOR NECK ABSCESS;  Surgeon: Ralene Ok, MD;  Location: [facility];  Service: General;  Laterality: N/A;    There were no vitals filed for this visit.   Subjective Assessment - 08/09/18  1029    Subjective  This 42yo female was referred for Physical Therapy evaluation on 07/19/2018 by Rolena Infante, MD with left Transtibial Amputation. She underwent a left Transtibial Amputation on 03/03/2018 and I&D of limb 04/10/2018 & 04/26/2018. She received her first prosthesis 08/01/2018. Her limb shrunk significantly and prosthetist is working on new socket. Patient presents to PT for evaluation sitting in w/c.     Pertinent History  LTTA, OA, COPD, asthma, DM2, Bipolar    Limitations  Lifting;Standing;Walking;House hold activities    Patient Stated Goals  To use prosthesis to walk in home & community, She would like to be more active with 14yo daughter.     Currently in Pain?  Yes    Pain Score  6    In last week, worst 8/10, best 4/10   Pain Location  Knee    Pain Orientation   Right;Left    Pain Descriptors / Indicators  Spasm;Aching;Stabbing    Pain Type  Chronic pain   arthritis   Pain Onset  More than a month ago    Pain Frequency  Constant    Aggravating Factors   standing, walking    Pain Relieving Factors  medications    Effect of Pain on Daily Activities  limits standing & walking         [facility] PT Assessment - 08/09/18 1015      Assessment   Medical Diagnosis  Left Transtibial Amputation    Referring Provider  Rolena Infante, MD    Onset Date/Surgical Date  08/01/18   prosthesis delivery   Hand Dominance  Right    Prior Therapy  HHPT 3-4 visits      Precautions   Precautions  Fall      Balance Screen   Has the patient fallen in the past 6 months  Yes    How many times?  12+   right leg gives out, forgot amputation   Has the patient had a decrease in activity level because of a fear of falling?   Yes    Is the patient reluctant to leave their home because of a fear of falling?   Yes      [place] residence    Living Arrangements  Children   12yo daughter, husband sometimes   Type of [place] entrance   other entrances 3 steps no rail   Home Layout  One level    Home Equipment  Wheelchair - manual;Walker - 2 wheels;Bedside commode;Tub bench      Prior Function   Level of Independence  Independent;Independent with household mobility without device;Independent with community mobility without device    Vocation  On disability   disability since 2010   Leisure  bowling, reading, time with daughter      Posture/Postural Control   Posture/Postural Control  Postural limitations    Postural Limitations  Rounded Shoulders;Forward head;Posterior pelvic tilt;Flexed trunk;Weight shift right    Posture Comments  morbid obesity      ROM / Strength   AROM / PROM / Strength  PROM;Strength      PROM   Overall PROM   Deficits    Overall PROM Comments  hip extension Thomas position  -20* bilaterally      Strength   Overall Strength  Deficits    Overall Strength Comments  trunk flexion & extension 3-/5    Strength Assessment Site  Hip;Knee;Ankle  Right/Left Hip  Right;Left    Right Hip Flexion  3+/5    Right Hip Extension  3-/5    Right Hip ABduction  3-/5    Left Hip Flexion  3+/5    Left Hip Extension  3-/5    Left Hip ABduction  3-/5    Right/Left Knee  Right;Left    Right Knee Flexion  4/5    Right Knee Extension  3+/5    Left Knee Flexion  3/5    Left Knee Extension  3/5    Right/Left Ankle  Right    Right Ankle Dorsiflexion  3/5    Right Ankle Plantar Flexion  2+/5      Bed Mobility   Bed Mobility  Rolling Right;Rolling Left;Supine to Sit;Sit to Supine    Rolling Right  Independent    Rolling Left  Independent    Supine to Sit  Independent   pulling on side of bed   Sit to Supine  Independent      Transfers   Transfers  Sit to Stand;Stand to Sit;Lateral/Scoot Transfers    Sit to Stand  5: Supervision;With upper extremity assist;With armrests;From chair/3-in-1   to RW for stabilization   Stand to Sit  5: Supervision;With upper extremity assist;With armrests;To chair/3-in-1   from RW for stability   Lateral/Scoot Transfers  6: Modified independent (Device/Increase time);With armrests removed   minimal clearance of buttocks     Ambulation/Gait   Ambulation/Gait  Yes    Ambulation/Gait Assistance  4: Min guard    Ambulation/Gait Assistance Details  hopping on RLE with minimal clearance.    Ambulation Distance (Feet)  10 Feet    Assistive device  Rolling walker    Ambulation Surface  Indoor;Level      Balance   Balance Assessed  Yes      Static Standing Balance   Static Standing - Balance Support  Bilateral upper extremity supported;No upper extremity supported   stands with BUE support on RW or no UE with Lt kneel on mat   Static Standing - Level of Assistance  5: Stand by assistance    Static Standing - Comment/# of Minutes  stands 1  minute with BUE support on RW on RLE only. OR left knee kneeling on mat table without UE support for 30 seconds with close supervision.       Dynamic Standing Balance   Dynamic Standing - Balance Support  During functional activity   RW intermittent support, Left knee kneeling on mat   Dynamic Standing - Level of Assistance  4: Min assist    Dynamic Standing - Balance Activities  Head nods;Head turns;Reaching for objects    Dynamic Standing - Comments  scans environment right/left & up/down with cervical motion only, minimal to no thoracic/lumbar or weight shift.  Reaches 2" forward with minA.       Prosthetics Assessment - 08/09/18 1015      Prosthetics   Prosthetic Care Dependent with  Skin check;Residual limb care;Care of non-amputated limb;Prosthetic cleaning;Ply sock cleaning;Correct ply sock adjustment;Proper wear schedule/adjustment;Proper weight-bearing schedule/adjustment    Current prosthetic wear tolerance (days/week)   reports wear 4 of 7 days that she had prosthesis    Current prosthetic wear tolerance (#hours/day)   reports wear 30 minutes     Edema  non-pitting edema    Residual limb condition   No open areas, dry skin, cylindrical shape, hair growth present that was shaved ~1 week ago (PT advised against shaving due to  risk of folliculitis), normal color & temperature               Objective measurements completed on examination: See above findings.                PT Short Term Goals - 08/09/18 2112      PT SHORT TERM GOAL #1   Title  Patient demonstrates proper donning of prosthesis & verbalizes proper cleaning. (All STGs 3rd visit after evaluation)    Baseline  Patient is dependent in all aspects of prosthetic care.     Time  3    Period  Weeks    Status  New      PT SHORT TERM GOAL #2   Title  Patient tolerates wear of prosthesis >5 hrs total / day without skin issues. (All STGs 3rd visit after evaluation)    Baseline  Patient wore prosthesis  4 of 7 days for 30 minutes that she had prosthesis prior to returning to prosthetist for socket redo.     Time  3    Period  Weeks    Status  New      PT SHORT TERM GOAL #3   Title  Pateint performs standing balance with RW support & prosthesis reaching 2" with supervision. (All STGs 3rd visit after evaluation)    Baseline  Patient performs standing balance with RW with left knee kneeling on mat table to simulate prosthetic support with minA for reaching.     Time  3    Period  Weeks    Status  New      PT SHORT TERM GOAL #4   Title  Patient ambulates 5' with rolling walker & prosthesis with minimal guard. (All STGs 3rd visit after evaluation)    Baseline  Patient hops with rolling walker with minA.     Time  3    Period  Weeks    Status  New        PT Long Term Goals - 08/09/18 2118      PT LONG TERM GOAL #1   Title  Patient verbalizes & demonstrates proper prosthetic care to enable safe use of prosthesis. (All LTGs Target Date 24 visits after evaluation)    Baseline  Patient is dependent in all prosthetic care.     Time  3    Period  Months    Status  New      PT LONG TERM GOAL #2   Title  Patient tolerates prosthesis wear >90% of awake hours to enable function throughout her day. (All LTGs Target Date 24 visits after evaluation)    Baseline  Patient wore prosthesis 4 of 7 days for 30 minutes.     Time  3    Period  Months    Status  New      PT LONG TERM GOAL #3   Title  Patient performs standing balance with prosthesis & walker or counter support: reaching 10" anteriorly, picking up objects from floor, scanning environment & managing clothes for toileting modified independent.    Baseline  Patient requires minA to reach 2" with RW support & left knee kneeling on mat table to simulate prosthetic support.     Time  3    Period  Months    Status  New      PT LONG TERM GOAL #4   Title  Patient ambulates 200' with RW & prosthesis modified independent to enable community  access.  Baseline  Patient hops 10' with RW with minimal assist. History of 12+ falls with mobility without prosthesis.     Time  3    Period  Months    Status  New      PT LONG TERM GOAL #5   Title  Patient negotiates ramps & curbs with RW and stairs with single rail & cane modified independent to enable community access.     Baseline  Patient is dependent in gait in home & non-ambulatory out of house (w/c bound)    Time  3    Period  Months    Status  New             Plan - 08/09/18 2056    Clinical Impression Statement  This 42yo female underwent a left Transtibial Amputation on 03/03/2018 with I&D surgeries 04/10/18 & 04/26/18. She has fallen multiple times (12+). She has abnormal posture and limited hip extension. She has weakness in trunk & bilateral lower extremities. She received her prosthesis for 1 week and wore prosthesis 4 of 7 days that she had prosthesis for 30 minutes only. Prosthetist has to redo socket so she to return prosthesis to prosthetist with anticipated return next week prior to first PT treatment.  Patient has impaired standing balance with high fall risk. She is dependent in gait & unknowledgeable in prosthetic gait.     History and Personal Factors relevant to plan of care:  Patient lives with 74yo daughter. She is estranged from husband who intermittently visits. Multiple falls with LE giving out per pt report. depression with tearful during session.     Clinical Presentation  Evolving    Clinical Presentation due to:  high fall risk with history of multiple 12+ falls, dependency in prosthetic care with risk of skin issues with DM, hx of bipolar with current depression, Hx of OA & pain,     Clinical Decision Making  Moderate    Rehab Potential  Good    PT Frequency  2x / week   1x/wk for 3 weeks & 2x/wk for 10 weeks   PT Duration  Other (comment)   13 weeks total   PT Treatment/Interventions  ADLs/Self Care Home Management;Moist Heat;Ultrasound;DME  Instruction;Canalith Repostioning;Gait training;Stair training;Functional mobility training;Therapeutic activities;Therapeutic exercise;Balance training;Neuromuscular re-education;Patient/family education;Prosthetic Training;Manual techniques;Vestibular    PT Next Visit Plan  instruct in prosthetic care and HEP at sink for balance & midline. prosthetic gait with RW    Consulted and Agree with Plan of Care  Patient       Patient will benefit from skilled therapeutic intervention in order to improve the following deficits and impairments:  Abnormal gait, Decreased activity tolerance, Decreased balance, Decreased endurance, Decreased knowledge of precautions, Decreased knowledge of use of DME, Decreased mobility, Decreased range of motion, Decreased skin integrity, Decreased scar mobility, Decreased strength, Dizziness, Increased edema, Postural dysfunction, Prosthetic Dependency, Obesity, Pain  Visit Diagnosis: Muscle weakness (generalized)  Foot drop, right  Abnormal posture  Other abnormalities of gait and mobility  Unsteadiness on feet  Repeated falls  Stiffness of left hip, not elsewhere classified  Stiffness of right hip, not elsewhere classified     Problem List Patient Active Problem List   Diagnosis Date Noted  . Allergic rhinitis 03/06/2018  . Unilateral complete BKA, left, sequela ([place])   . Decreased pedal pulses 06/10/2017  . Unilateral primary osteoarthritis, right knee 10/22/2016  . Diabetic neuropathy ([place]) 07/14/2016  . Syncope 02/25/2016  . De Quervain's tenosynovitis, bilateral 11/01/2015  .  Vitamin D deficiency 09/05/2015  . Recurrent candidiasis of vagina 09/05/2015  . Varicose veins of leg with complications 50/75/7322  . Restless leg syndrome 10/17/2014  . Chronic sinusitis 07/18/2014  . Headache 07/15/2014  . Urge incontinence 10/15/2013  . Encounter for chronic pain management 06/30/2013  . HLD (hyperlipidemia) 11/19/2012  . Chest pain 06/27/2012   . Right carpal tunnel syndrome 09/01/2011  . Bilateral knee pain 09/01/2011  . Diabetes mellitus ([place]) 05/22/2008  . Morbid obesity ([place]) 05/22/2008  . 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 Depression 05/22/2008  . Obstructive sleep apnea 05/22/2008  . Hypertension 05/22/2008  . Asthma 05/22/2008  . GERD 05/22/2008    Isadore Bokhari PT, DPT 08/09/2018, 9:28 PM  [facility] [address] [place], [place], [address] Phone: [phone]   Fax:  [phone]  Name: [name] MRN: [id number] Date of Birth: 05-29-1976

## 2018-08-10 DIAGNOSIS — Z6841 Body Mass Index (BMI) 40.0 and over, adult: Secondary | ICD-10-CM | POA: Diagnosis not present

## 2018-08-10 DIAGNOSIS — M2550 Pain in unspecified joint: Secondary | ICD-10-CM | POA: Diagnosis not present

## 2018-08-11 ENCOUNTER — Encounter: Payer: Self-pay | Admitting: Family Medicine

## 2018-08-11 ENCOUNTER — Other Ambulatory Visit: Payer: Self-pay

## 2018-08-11 ENCOUNTER — Ambulatory Visit (INDEPENDENT_AMBULATORY_CARE_PROVIDER_SITE_OTHER): Payer: Medicaid Other | Admitting: Family Medicine

## 2018-08-11 VITALS — BP 124/80 | HR 83 | Temp 98.1°F

## 2018-08-11 DIAGNOSIS — E1149 Type 2 diabetes mellitus with other diabetic neurological complication: Secondary | ICD-10-CM | POA: Diagnosis not present

## 2018-08-11 DIAGNOSIS — M25561 Pain in right knee: Secondary | ICD-10-CM

## 2018-08-11 DIAGNOSIS — F39 Unspecified mood [affective] disorder: Secondary | ICD-10-CM

## 2018-08-11 MED ORDER — HYDROCODONE-ACETAMINOPHEN 10-325 MG PO TABS: 1.0000 | ORAL_TABLET | Freq: Three times a day (TID) | ORAL | 0 refills | Status: DC | PRN

## 2018-08-11 NOTE — Assessment & Plan Note (Signed)
Patient with significant stress with her with her marriage currently.  She plans to see a therapist and will return in 2 weeks.  Has a history of bipolar disorder.  Will consider referral to [facility] for mood stabilizing medication.

## 2018-08-11 NOTE — Patient Instructions (Addendum)
It was wonderful to see you today.  Thank you for choosing [facility].   Please call [phone] with any questions about today's appointment.  Please be sure to schedule follow up at the front  desk before you leave today.   Dorris Singh, MD  Family Medicine   Please call to schedule follow up in 10-14 days to see me--- I have clinic on September 27 and October 4.

## 2018-08-11 NOTE — Progress Notes (Addendum)
Patient Name: [name] Date of Birth: 03/27/1976 Date of Visit: 08/11/18 PCP: [name], MD  Chief Complaint:   Subjective: [name] is a pleasant 42 y.o. 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 reports she is very upset.  3 weeks ago her husband left the house and has not returned.  They have been talking.  He left the house after they had multiple flights over the past several months.  She reports she thinks this is related to her knee amputation as she has not been as ambulatory and this is contributed to dysfunction in their marriage.  She has known her husband for 13 years, they have been together for 12 years, and they have been married for 5 years.  Her daughter considers her husband her father.  She is very upset about this she reports she is not sleeping well.  She is staying up during the night.  She is sleeping during the daytime she describes her mood as depressed.  She reports what bothers her the most is that she does not think he will come back.  She describes little support.  She has already schedule an appointment with her therapist she denies feeling hopeless or worthless.  She endorses passive suicidal ideation.  She does not have a plan.  She says multiple things keep her from doing this including her daughter.  She has no history of suicidal attempt she reports.  She has tried multiple antidepressants in the past she states.  She reports none have ever worked for her.  [name] reports her diabetes is markedly better controlled.  She has not missed a single dose of her insulin she reports.  She is due for her A1c today.  She does not really want to talk about her diabetes as she is quite upset about her husband.  She is taking her medications as prescribed.  She reports her  morning blood glucoses are between 101 120.  She is taking long-acting insulin at 85 units and short acting insulin of 15 to 20 units.   [name] is on hydrocodone for chronic lower extremity pain.  She denies side effects from this medication.  She does have some constipation which is well controlled.  She does not drive she denies sleepiness, or excess use.   [name] has been in the ED for several falls over the last few months since her BKA.  Most recently she reports she fell in mid August.  She was seen in the emergency department she reports she stood up on her right foot and forgot her left foot was not there when she lost her balance and fell down.  She denies hitting her head.  She denies syncope.  She has not had a fall since this time.  She is scheduled for multiple physical therapy visits 3 times a week for the next several months. ROS:  ROS   Negative for fevers, chest pain, shortness of breath, lower extremity skin breakdown, or redness around her left BKA.   I have reviewed the patient's medical, surgical, family, and social history as appropriate.   Vitals:   08/11/18 1004  BP: 124/80  Pulse: 83  Temp: 98.1 F (36.7 C)  SpO2: 99%    HEENT: Sclera anicteric. Dentition is moderate. Appears well hydrated. Neck: Supple Cardiac: Regular rate and  rhythm. Normal S1/S2. No murmurs, rubs, or gallops appreciated. Lungs: Clear bilaterally to ascultation.  Left BKA stump is completely dressed today.  Did not examine skin Affect is flat.  She is weeping intermittently throughout the exam    R[place]quel was seen today for medication refill.  Diagnoses and all orders for this visit:  Mood Disorder chronic problem acutely worsened.  spent greater than 20 minutes with the patient regarding this today.  We discussed options including therapy, medications.  She plans to see her therapist.  She is an appointment next week.  She will see me back in 2 weeks to check and she will consider  medication.  I am concerned that her depressive symptoms may be a manifestation of an underlying bipolar disorder and would be hesitant to start an SSRI or SNRI.  I would recommend that she establish with a psychiatrist that she would benefit from a mood stabilizing medication.  Differential does include depressive symptoms entirely from the acute event resulting in grieving  Right knee pain, unspecified chronicity, chronic problem, stable, addressed today as above.  -     HYDROcodone-acetaminophen (NORCO) 10-325 MG tablet; Take 1 tablet by mouth 3 (three) times daily as needed. -     HYDROcodone-acetaminophen (NORCO) 10-325 MG tablet; Take 1 tablet by mouth every 8 (eight) hours as needed. - Prescription given for 52-month supply.  We discussed the risks of the above medication we discussed the appropriate use of the above medication.  The prescription monitoring program was reviewed and was appropriate.  During her time of which she had osteomyelitis she did get several prescriptions for narcotics from outside providers in emergency department she has not gotten any since that time  Type II diabetes mellitus with neurological manifestations ([place]), chronic problem addressed as above, due for labs and further evaluation of lipids to see if would benefit from increased statin intensity.  -     HgB A1c; Future -     Lipid Panel; Future -     Basic Metabolic Panel; Future -  The patient is currently on metformin,  ACE inhibitor, and a statin.    Left BKA due to history of type II diabetes in setting of peripheral neuropathy, resulting in osteomyelitis, and below the knee amputation earlier this year, diabetic shoe form completed.   The patient declined a flu shot today we will continue to address  Dorris Singh, MD  [facility] Medicine Teaching Service

## 2018-08-14 ENCOUNTER — Telehealth: Payer: Self-pay | Admitting: Family Medicine

## 2018-08-14 DIAGNOSIS — M2550 Pain in unspecified joint: Secondary | ICD-10-CM | POA: Diagnosis not present

## 2018-08-14 DIAGNOSIS — Z6841 Body Mass Index (BMI) 40.0 and over, adult: Secondary | ICD-10-CM | POA: Diagnosis not present

## 2018-08-14 NOTE — Telephone Encounter (Signed)
Faxed notes to [facility]az[facility]. Deseree Kennon Holter, CMA

## 2018-08-14 NOTE — Telephone Encounter (Signed)
[name] from [facility] called to let Dr. Owens Shark know that she received the paper work faxed to them today but it was missing the appointment notes from her foot exam on 08/11/18. She would like to know if those could be faxed as well. The best number to contact [name] is [phone]

## 2018-08-14 NOTE — Telephone Encounter (Signed)
Please print and fax my progress note from Friday.   Dorris Singh, MD  Family Medicine Teaching Service

## 2018-08-15 ENCOUNTER — Encounter (HOSPITAL_COMMUNITY): Payer: Self-pay | Admitting: *Deleted

## 2018-08-15 ENCOUNTER — Other Ambulatory Visit: Payer: Self-pay

## 2018-08-15 ENCOUNTER — Ambulatory Visit: Payer: Medicaid Other | Admitting: Sports Medicine

## 2018-08-15 ENCOUNTER — Emergency Department (HOSPITAL_COMMUNITY): Payer: Medicaid Other

## 2018-08-15 ENCOUNTER — Emergency Department (HOSPITAL_COMMUNITY)
Admission: EM | Admit: 2018-08-15 | Discharge: 2018-08-15 | Disposition: A | Discharge status: Home / Self Care | Payer: Medicaid Other | Attending: Emergency Medicine | Admitting: Emergency Medicine

## 2018-08-15 DIAGNOSIS — Z7982 Long term (current) use of aspirin: Secondary | ICD-10-CM | POA: Insufficient documentation

## 2018-08-15 DIAGNOSIS — L03031 Cellulitis of right toe: Secondary | ICD-10-CM | POA: Insufficient documentation

## 2018-08-15 DIAGNOSIS — Z87891 Personal history of nicotine dependence: Secondary | ICD-10-CM | POA: Diagnosis not present

## 2018-08-15 DIAGNOSIS — E1165 Type 2 diabetes mellitus with hyperglycemia: Secondary | ICD-10-CM | POA: Diagnosis not present

## 2018-08-15 DIAGNOSIS — I10 Essential (primary) hypertension: Secondary | ICD-10-CM | POA: Insufficient documentation

## 2018-08-15 DIAGNOSIS — Z6841 Body Mass Index (BMI) 40.0 and over, adult: Secondary | ICD-10-CM | POA: Diagnosis not present

## 2018-08-15 DIAGNOSIS — M79674 Pain in right toe(s): Secondary | ICD-10-CM | POA: Diagnosis present

## 2018-08-15 DIAGNOSIS — J45909 Unspecified asthma, uncomplicated: Secondary | ICD-10-CM | POA: Insufficient documentation

## 2018-08-15 DIAGNOSIS — M79671 Pain in right foot: Secondary | ICD-10-CM | POA: Diagnosis not present

## 2018-08-15 DIAGNOSIS — L03032 Cellulitis of left toe: Secondary | ICD-10-CM

## 2018-08-15 DIAGNOSIS — Z79899 Other long term (current) drug therapy: Secondary | ICD-10-CM | POA: Insufficient documentation

## 2018-08-15 DIAGNOSIS — L089 Local infection of the skin and subcutaneous tissue, unspecified: Secondary | ICD-10-CM | POA: Insufficient documentation

## 2018-08-15 DIAGNOSIS — R52 Pain, unspecified: Secondary | ICD-10-CM | POA: Diagnosis not present

## 2018-08-15 DIAGNOSIS — Z89512 Acquired absence of left leg below knee: Secondary | ICD-10-CM | POA: Insufficient documentation

## 2018-08-15 DIAGNOSIS — E119 Type 2 diabetes mellitus without complications: Secondary | ICD-10-CM | POA: Diagnosis not present

## 2018-08-15 DIAGNOSIS — Z794 Long term (current) use of insulin: Secondary | ICD-10-CM | POA: Diagnosis not present

## 2018-08-15 DIAGNOSIS — M2550 Pain in unspecified joint: Secondary | ICD-10-CM | POA: Diagnosis not present

## 2018-08-15 DIAGNOSIS — L03116 Cellulitis of left lower limb: Secondary | ICD-10-CM | POA: Diagnosis not present

## 2018-08-15 LAB — BASIC METABOLIC PANEL
Anion gap: 13 (ref 5–15)
BUN: 17 mg/dL (ref 6–20)
CHLORIDE: 100 mmol/L (ref 98–111)
CO2: 22 mmol/L (ref 22–32)
CREATININE: 1.05 mg/dL — AB (ref 0.44–1.00)
Calcium: 9 mg/dL (ref 8.9–10.3)
GFR calc Af Amer: >60 mL/min (ref >60)
GFR calc non Af Amer: >60 mL/min (ref >60)
GLUCOSE: 422 mg/dL — AB (ref 70–99)
Potassium: 3.8 mmol/L (ref 3.5–5.1)
Sodium: 135 mmol/L (ref 135–145)

## 2018-08-15 LAB — CBC WITH DIFFERENTIAL/PLATELET
BASOS ABS: 0 10*3/uL (ref 0.0–0.1)
BASOS PCT: 0 %
EOS ABS: 0.2 10*3/uL (ref 0.0–0.7)
Eosinophils Relative: 2 %
HCT: 35.3 % — ABNORMAL LOW (ref 36.0–46.0)
HEMOGLOBIN: 11.7 g/dL — AB (ref 12.0–15.0)
Lymphocytes Relative: 43 %
Lymphs Abs: 4.1 10*3/uL — ABNORMAL HIGH (ref 0.7–4.0)
MCH: 29.2 pg (ref 26.0–34.0)
MCHC: 33.1 g/dL (ref 30.0–36.0)
MCV: 88 fL (ref 78.0–100.0)
MONOS PCT: 6 %
Monocytes Absolute: 0.6 10*3/uL (ref 0.1–1.0)
Neutro Abs: 4.7 10*3/uL (ref 1.7–7.7)
Neutrophils Relative %: 49 %
Platelets: 368 10*3/uL (ref 150–400)
RBC: 4.01 MIL/uL (ref 3.87–5.11)
RDW: 13.1 % (ref 11.5–15.5)
WBC: 9.5 10*3/uL (ref 4.0–10.5)

## 2018-08-15 LAB — LACTIC ACID, PLASMA: LACTIC ACID, VENOUS: 2.2 mmol/L — AB (ref 0.5–1.9)

## 2018-08-15 MED ORDER — TRAMADOL HCL 50 MG PO TABS: 50.0000 mg | ORAL_TABLET | Freq: Four times a day (QID) | ORAL | 0 refills | Status: DC | PRN

## 2018-08-15 MED ORDER — CLINDAMYCIN HCL 150 MG PO CAPS: 450.0000 mg | ORAL_CAPSULE | Freq: Three times a day (TID) | ORAL | 0 refills | Status: DC

## 2018-08-15 NOTE — ED Triage Notes (Signed)
Pt arrives from home via EMS with c/o ingrown toenail on the right foot for 4days. Pt has podiatry appointment today at 1pm. VSS en route. CBG 481-pt reported that she had a snack cake before bed and took her insulin.

## 2018-08-15 NOTE — ED Triage Notes (Signed)
Pt says she has redness and pain in the right great toe for 2-3 days, bloody drainage from the area. She has a cream from a prior time when she had an ingrown toenail she has been using w/o relief. Pt is suppose to see Dr. Amalia Hailey today for the same.

## 2018-08-15 NOTE — Discharge Instructions (Addendum)
You can take 1000 mg of Tylenol.  Do not exceed 4000 mg of Tylenol a day.  Take antibiotics as directed. Please take all of your antibiotics until finished.  Follow up with Dr. Amalia Hailey (Podiatry) as directed.

## 2018-08-15 NOTE — ED Notes (Signed)
CRITICAL VALUE STICKER  CRITICAL VALUE: Lactic acid of 2.2  RECEIVER (on-site recipient of call): Driscoll NOTIFIED: 08/15/18 @ 07:55  MESSENGER (representative from lab):   MD NOTIFIED: Dr. Ronnald Nian  TIME OF NOTIFICATION: 07:56  RESPONSE: Waiting for response

## 2018-08-15 NOTE — ED Provider Notes (Signed)
[facility] DEPT Provider Note   CSN: [id number] Arrival date & time: 08/15/18  0353     History   Chief Complaint Chief Complaint  Patient presents with  . Toe Pain    HPI [name] is a 42 y.o. female with Acute osteomyelitis (left ankle/foot), Asthma, Bipolar, DM who presents for evaluation of right 1st toe pain, erythema, edema, draining that has been ongoing for the last 4 days. Patient reports that this has been an ongoing issue for a while. She states that she feels like it is worsened in the last 4 days.  He states that the redness started spreading and she noticed some drainage from the toe.  She is scheduled to see Dr. Amalia Hailey (Podiatry) today at 1 PM for evaluation.  Patient states she called EMS tonight because she was having difficulty sleeping secondary to pain.  Patient also reports that she is concerned because she has had osteomyelitis of the left lower extremity which is causing amputation.  Patient denies any fevers, numbness/weakness.  The history is provided by the patient.    Past Medical History:  Diagnosis Date  . Acute osteomyelitis of ankle or foot, left ([place]) 01/31/2018  . Alveolar hypoventilation   . Anemia    not on iron pill  . Asthma   . Bipolar 2 disorder ([place])   . Carpal tunnel syndrome on right    recurrent  . Cellulitis 08/2010-08/2011  . Chronic pain   . COPD (chronic obstructive pulmonary disease) ([name])    Symbicort daily and Proventil as needed  . Costochondritis   . Depression   . Diabetes mellitus 2000   Type 2, Uncontrolled.Takes Lantus daily.Fasting blood sugar runs 150  . Dizziness    occasionally  . Drug-seeking behavior   . GERD (gastroesophageal reflux disease)    takes Pantoprazole and Zantac daily  . Headache    migraine-last one about a yr ago.Topamax daily  . HLD (hyperlipidemia)    takes Atorvastatin daily  . Hypertension    takes Lisinopril and Coreg daily  . Morbid obesity ([place])   .  Muscle spasm    takes Flexeril as needed  . Nocturia   . Obstructive sleep apnea   . Peripheral neuropathy    takes Gabapentin daily  . Pneumonia    "walking" several yrs ago and as a baby  . Rectal fissure   . Restless leg   . Urinary frequency   . Varicose veins    Right medial thigh and Left leg     Patient Active Problem List   Diagnosis Date Noted  . Allergic rhinitis 03/06/2018  . Unilateral complete BKA, left, sequela ([place])   . Decreased pedal pulses 06/10/2017  . Unilateral primary osteoarthritis, right knee 10/22/2016  . Diabetic neuropathy ([place]) 07/14/2016  . Syncope 02/25/2016  . De Quervain's tenosynovitis, bilateral 11/01/2015  . Vitamin D deficiency 09/05/2015  . Recurrent candidiasis of vagina 09/05/2015  . Varicose veins of leg with complications 95/18/8416  . Restless leg syndrome 10/17/2014  . Chronic sinusitis 07/18/2014  . Headache 07/15/2014  . Urge incontinence 10/15/2013  . Encounter for chronic pain management 06/30/2013  . HLD (hyperlipidemia) 11/19/2012  . Chest pain 06/27/2012  . Right carpal tunnel syndrome 09/01/2011  . Bilateral knee pain 09/01/2011  . Diabetes mellitus ([place]) 05/22/2008  . Morbid obesity ([place]) 05/22/2008  . 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 Depression 05/22/2008  . Obstructive sleep apnea 05/22/2008  . Hypertension 05/22/2008  . Asthma 05/22/2008  .  GERD 05/22/2008    Past Surgical History:  Procedure Laterality Date  . AMPUTATION Left 02/01/2018   Procedure: LEFT FOURTH AND 5TH TOE RAY AMPUTATION;  Surgeon: [name], MD;  Location: [facility];  Service: Orthopedics;  Laterality: Left;  . AMPUTATION Left 03/03/2018   Procedure: LEFT BELOW KNEE AMPUTATION;  Surgeon: [name], MD;  Location: [place] [facility];  Service: Orthopedics;  Laterality: Left;  . CARPAL TUNNEL RELEASE Bilateral   . CESAREAN SECTION    . KNEE ARTHROSCOPY Right 07/17/2010  . LEFT HEART CATHETERIZATION WITH CORONARY ANGIOGRAM N/A 07/27/2012     Procedure: LEFT HEART CATHETERIZATION WITH CORONARY ANGIOGRAM;  Surgeon: Sherren Mocha, MD;  Location: [facility] CATH LAB;  Service: Cardiovascular;  Laterality: N/A;  . left knee surgery     screws she thinks  . MASS EXCISION N/A 06/29/2013   Procedure:  WIDE LOCAL EXCISION OF POSTERIOR NECK ABSCESS;  Surgeon: Ralene Ok, MD;  Location: [place];  Service: General;  Laterality: N/A;     OB History    Gravida  2   Para  1   Term      Preterm      AB  1   Living  1     SAB  1   TAB      Ectopic      Multiple      Live Births               Home Medications    Prior to Admission medications   Medication Sig Start Date End Date Taking? Authorizing Provider  aspirin 81 MG chewable tablet Chew 81 mg by mouth daily.   Yes [provider]  atorvastatin (LIPITOR) 20 MG tablet Take 1 tablet (20 mg total) by mouth daily. 03/16/18  Yes Angiulli, Lavon Paganini, PA-C  budesonide-formoterol (SYMBICORT) 160-4.5 MCG/ACT inhaler inhale 2 PUFFS into THE lungs 2 TIMES DAILY Patient taking differently: Inhale 2 puffs into the lungs 2 (two) times daily.  03/16/18  Yes Angiulli, Lavon Paganini, PA-C  cetirizine (ZYRTEC) 10 MG tablet Take 1 tablet (10 mg total) by mouth daily. Patient taking differently: Take 10 mg by mouth daily as needed for allergies.  02/28/18  Yes [name], MD  Cholecalciferol 1000 units tablet Take 1 tablet (1,000 Units total) by mouth daily. 04/05/17  Yes [name], MD  gabapentin (NEURONTIN) 600 MG tablet Take 2 tablets (1,200 mg total) by mouth 3 (three) times daily. 06/20/18  Yes [name], MD  HYDROcodone-acetaminophen (NORCO) 10-325 MG tablet Take 1 tablet by mouth 3 (three) times daily as needed. Patient taking differently: Take 1 tablet by mouth 3 (three) times daily as needed for moderate pain.  08/14/18  Yes Martyn Malay, MD  insulin aspart (NOVOLOG) 100 UNIT/ML FlexPen Inject 6 Units into the skin 3 (three) times daily with  meals. Patient taking differently: Inject 22 Units into the skin 3 (three) times daily with meals. Plus sliding scale 03/16/18  Yes Angiulli, Lavon Paganini, PA-C  insulin degludec (TRESIBA FLEXTOUCH) 100 UNIT/ML SOPN FlexTouch Pen Inject 85 Units into the skin daily.   Yes [provider]  lisinopril (PRINIVIL,ZESTRIL) 2.5 MG tablet Take 2.5 mg by mouth daily.   Yes [provider]  metFORMIN (GLUCOPHAGE-XR) 500 MG 24 hr tablet Take 500 mg by mouth daily with breakfast.  04/26/18 04/26/19 Yes [provider]  nystatin (MYCOSTATIN/NYSTOP) powder Apply topically 4 (four) times daily. Patient taking differently: Apply 1 g topically 4 (  four) times daily.  06/20/18  Yes [name], MD  pantoprazole (PROTONIX) 40 MG tablet Take 1 tablet (40 mg total) by mouth daily. 03/16/18  Yes Angiulli, Lavon Paganini, PA-C  ranitidine (ZANTAC) 150 MG capsule Take 150 mg by mouth 2 (two) times daily as needed for heartburn.    Yes [provider]  ACCU-CHEK SOFTCLIX LANCETS lancets Use as instructed to check blood sugar 3 times per day 03/16/18   Angiulli, Lavon Paganini, PA-C  albuterol (PROVENTIL HFA;VENTOLIN HFA) 108 (90 Base) MCG/ACT inhaler Inhale 1-2 puffs into the lungs every 6 (six) hours as needed for wheezing or shortness of breath. 01/17/18   [name], MD  Blood Glucose Monitoring Suppl (ACCU-CHEK NANO SMARTVIEW) w/Device KIT Use to check blood sugar 3 times daily 03/16/18   Angiulli, Lavon Paganini, PA-C  glucose blood (ACCU-CHEK GUIDE) test strip USE T0 CHECK BLOOD SUGAR 3 TIMES DAILY 03/16/18   Angiulli, Lavon Paganini, PA-C  HYDROcodone-acetaminophen (NORCO) 10-325 MG tablet Take 1 tablet by mouth every 8 (eight) hours as needed. Patient not taking: Reported on 08/15/2018 09/11/18   Martyn Malay, MD  nystatin cream (MYCOSTATIN) Apply 1 application topically 2 (two) times daily. 06/20/18   [name], MD    Family History Family History  Problem Relation Age of Onset  .  Diabetes Mother   . Hyperlipidemia Mother   . Depression Mother   . Varicose Veins Mother   . Heart attack Paternal Uncle   . Heart disease Paternal Grandmother   . Heart attack Paternal Grandmother   . Heart attack Paternal Grandfather   . Heart disease Paternal Grandfather   . Heart attack Father   . Cancer Maternal Grandmother        COLON  . Hypertension Maternal Grandmother   . Hyperlipidemia Maternal Grandmother   . Diabetes Maternal Grandmother   . Other Maternal Grandfather        GUN SHOT    Social History Social History   Tobacco Use  . Smoking status: Former Smoker    Packs/day: 0.30    Years: 0.30    Pack years: 0.09    Types: Cigarettes    Last attempt to quit: 12/06/1993    Years since quitting: 24.7  . Smokeless tobacco: Never Used  Substance Use Topics  . Alcohol use: No    Alcohol/week: 0.0 standard drinks  . Drug use: Yes    Comment: OD attempts on home meds       Allergies   Kiwi extract; Trental [pentoxifylline]; and Nubain [nalbuphine hcl]   Review of Systems Review of Systems  Constitutional: Negative for fever.  Skin: Positive for color change and wound.  Neurological: Negative for weakness and numbness.  All other systems reviewed and are negative.    Physical Exam Updated Vital Signs BP (!) 106/59 (BP Location: Right Arm)   Pulse 82   Temp 97.9 F (36.6 C) (Oral)   Resp 18   Ht '5\' 6"'$  (1.676 m)   Wt (!) 140 kg   SpO2 96%   BMI 49.82 kg/m   Physical Exam  Constitutional: She appears well-developed and well-nourished.  HENT:  Head: Normocephalic and atraumatic.  Eyes: Conjunctivae and EOM are normal. Right eye exhibits no discharge. Left eye exhibits no discharge. No scleral icterus.  Cardiovascular:  Pulses:      Dorsalis pedis pulses are 2+ on the right side, and 2+ on the left side.  Pulmonary/Chest: Effort normal.  Musculoskeletal:  Tenderness to  palpation noted to the right 1st toe.   Neurological: She is alert.   Skin: Skin is warm and dry. Capillary refill takes less than 2 seconds.  Right 1st toe is erythematous, edematous. Active serosanginous drainage noted.   Psychiatric: She has a normal mood and affect. Her speech is normal and behavior is normal.  Nursing note and vitals reviewed.    ED Treatments / Results  Labs (all labs ordered are listed, but only abnormal results are displayed) Labs Reviewed  BASIC METABOLIC PANEL  CBC WITH DIFFERENTIAL/PLATELET  LACTIC ACID, PLASMA  LACTIC ACID, PLASMA    EKG None  Radiology Dg Foot Complete Right  Result Date: 08/15/2018 CLINICAL DATA:  Right foot pain. Patient reports pain and redness but right great toe. No known injury. EXAM: RIGHT FOOT COMPLETE - 3+ VIEW COMPARISON:  Foot radiograph 08/02/2016 FINDINGS: There is no evidence of fracture or dislocation. Chronic change of the anterior tibial talar joint which is likely degenerative. No periosteal reaction or bony destructive change. Mild hammertoe deformity of the digits. Plantar calcaneal spur and Achilles tendon enthesophyte. Vascular calcifications, advanced for age. Dermal calcifications in the included lower leg, partially included. No soft tissue air or radiopaque foreign body. IMPRESSION: 1. No acute findings. 2. 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 Electronically Signed   By: Keith Rake M.D.   On: 08/15/2018 06:00    Procedures Procedures (including critical care time)  Medications Ordered in ED Medications - No data to display   Initial Impression / Assessment and Plan / ED Course  I have reviewed the triage vital signs and the nursing notes.  Pertinent labs & imaging results that were available during my care of the patient were reviewed by me and considered in my medical decision making (see chart for details).     42 y.o. F with possible history of ostium mellitus, diabetes who presents for  evaluation of redness, pain, swelling noted to right first toe x4 days.  Is scheduled to see Dr. Amalia Hailey from podiatry at 1 PM this afternoon but called EMS because she was having trouble sleeping secondary to the pain.  No fevers noted. Patient is afebrile, non-toxic appearing, sitting comfortably on examination table. Vital signs reviewed and stable. Patient is neurovascularly intact.  On exam, patient has diffuse erythema, edema noted to the first toe.  There is some edema and purulent drainage noted surrounding the nailbed that appears to be somewhat of a paronychia.  It is actively draining.  Given patient's history of osteomyelitis, will plan to check basic labs, x-ray.  X-ray shows chronic degenerative changes noted in the tibiotalar joint.  No other acute abnormalities.  No evidence of subcutaneous emphysema.  Signed out to Irena Cords, PA-C with labs pending.  If labs unremarkable, anticipate discharge for treatment of cellulitis with antibiotic's.  Patient is scheduled to see Dr. Amalia Hailey for evaluation of the foot this evening.     Final Clinical Impressions(s) / ED Diagnoses   Final diagnoses:  Cellulitis of toe of left foot  Toe infection    ED Discharge Orders    None       Desma Mcgregor 08/15/18 2220    Veryl Speak, MD 08/15/18 2307

## 2018-08-16 ENCOUNTER — Encounter: Payer: Medicaid Other | Admitting: Podiatry

## 2018-08-16 DIAGNOSIS — Z6841 Body Mass Index (BMI) 40.0 and over, adult: Secondary | ICD-10-CM | POA: Diagnosis not present

## 2018-08-16 DIAGNOSIS — M2550 Pain in unspecified joint: Secondary | ICD-10-CM | POA: Diagnosis not present

## 2018-08-17 DIAGNOSIS — Z6841 Body Mass Index (BMI) 40.0 and over, adult: Secondary | ICD-10-CM | POA: Diagnosis not present

## 2018-08-17 DIAGNOSIS — M2550 Pain in unspecified joint: Secondary | ICD-10-CM | POA: Diagnosis not present

## 2018-08-21 ENCOUNTER — Ambulatory Visit: Payer: Medicaid Other | Admitting: Physical Therapy

## 2018-08-27 NOTE — Progress Notes (Signed)
This encounter was created in error - please disregard.

## 2018-08-28 ENCOUNTER — Ambulatory Visit: Payer: Medicaid Other | Admitting: Podiatry

## 2018-08-29 DIAGNOSIS — Z89512 Acquired absence of left leg below knee: Secondary | ICD-10-CM | POA: Diagnosis not present

## 2018-08-30 ENCOUNTER — Encounter: Payer: Self-pay | Admitting: Physical Therapy

## 2018-08-30 ENCOUNTER — Ambulatory Visit: Payer: Medicaid Other | Admitting: Physical Therapy

## 2018-08-30 DIAGNOSIS — R2689 Other abnormalities of gait and mobility: Secondary | ICD-10-CM | POA: Diagnosis not present

## 2018-08-30 DIAGNOSIS — M25651 Stiffness of right hip, not elsewhere classified: Secondary | ICD-10-CM | POA: Diagnosis not present

## 2018-08-30 DIAGNOSIS — R293 Abnormal posture: Secondary | ICD-10-CM

## 2018-08-30 DIAGNOSIS — R2681 Unsteadiness on feet: Secondary | ICD-10-CM | POA: Diagnosis not present

## 2018-08-30 DIAGNOSIS — M2550 Pain in unspecified joint: Secondary | ICD-10-CM | POA: Diagnosis not present

## 2018-08-30 DIAGNOSIS — M21371 Foot drop, right foot: Secondary | ICD-10-CM | POA: Diagnosis not present

## 2018-08-30 DIAGNOSIS — Z6841 Body Mass Index (BMI) 40.0 and over, adult: Secondary | ICD-10-CM | POA: Diagnosis not present

## 2018-08-30 DIAGNOSIS — M25652 Stiffness of left hip, not elsewhere classified: Secondary | ICD-10-CM | POA: Diagnosis not present

## 2018-08-30 DIAGNOSIS — M6281 Muscle weakness (generalized): Secondary | ICD-10-CM

## 2018-08-30 DIAGNOSIS — R296 Repeated falls: Secondary | ICD-10-CM | POA: Diagnosis not present

## 2018-08-30 NOTE — Patient Instructions (Signed)
Do each exercise 2  times per day Do each exercise 10 repetitions Hold each exercise for 5 seconds to feel your location  AT SINK FIND YOUR MIDLINE POSITION AND PLACE FEET EQUAL DISTANCE FROM THE MIDLINE.  USE TAPE ON FLOOR TO MARK THE MIDLINE POSITION. You also should try to feel with your limb pressure in socket.  You are trying to feel with limb what you used to feel with the bottom of your foot.  1. Side to Side Shift: Moving your hips only (not shoulders): move weight onto your left leg, HOLD/FEEL.  Move back to equal weight on each leg, HOLD/FEEL. Move weight onto your right leg, HOLD/FEEL. Move back to equal weight on each leg, HOLD/FEEL. Repeat. 2. Front to Back Shift: Moving your hips only (not shoulders): move your weight forward onto your toes, HOLD/FEEL. Move your weight back to equal Flat Foot on both legs, HOLD/FEEL. Move your weight back onto your heels, HOLD/FEEL. Move your weight back to equal on both legs, HOLD/FEEL. Repeat. 3. Moving Cones / Cups: With equal weight on each leg: Hold on with one hand the first time, then progress to no hand supports. Move cups from one side of sink to the other. Place cups ~2" out of your reach, progress to 10" beyond reach. 4. Overhead/Upward Reaching: alternated reaching up to top cabinets or ceiling if no cabinets present. Keep equal weight on each leg. Start with one hand support on counter while other hand reaches and progress to no hand support with reaching. 5.   Looking Over Shoulders: With equal weight on each leg: alternate turning to look over your shoulders with one hand support on counter as needed. Shift weight to side looking, pull hip then shoulder then head/eyes around to look behind you. Start with one hand support & progress to no hand support. 

## 2018-08-31 DIAGNOSIS — M2550 Pain in unspecified joint: Secondary | ICD-10-CM | POA: Diagnosis not present

## 2018-08-31 DIAGNOSIS — Z6841 Body Mass Index (BMI) 40.0 and over, adult: Secondary | ICD-10-CM | POA: Diagnosis not present

## 2018-08-31 NOTE — Therapy (Signed)
[facility] [address] [place], [place], [address] Phone: [phone]   Fax:  [phone]  Physical Therapy Treatment  Patient Details  Name: [name] MRN: [id number] Date of Birth: 27-Dec-1975 Referring Provider (PT): Rolena Infante, MD   Encounter Date: 08/30/2018  PT End of Session - 08/30/18 1205    Visit Number  1    Number of Visits  25    Authorization Type  Medicaid    Authorization Time Period  9/16-10/6/19: 3 visits approved    Authorization - Visit Number  1    Authorization - Number of Visits  3    PT Start Time  1150    PT Stop Time  7353    PT Time Calculation (min)  45 min    Equipment Utilized During Treatment  Gait belt    Activity Tolerance  Patient limited by fatigue;Patient tolerated treatment well;No increased pain    Behavior During Therapy  WFL for tasks assessed/performed   tearful      Past Medical History:  Diagnosis Date  . Acute osteomyelitis of ankle or foot, left ([facility]) 01/31/2018  . Alveolar hypoventilation   . Anemia    not on iron pill  . Asthma   . Bipolar 2 disorder ([place])   . Carpal tunnel syndrome on right    recurrent  . Cellulitis 08/2010-08/2011  . Chronic pain   . COPD (chronic obstructive pulmonary disease) ([name])    Symbicort daily and Proventil as needed  . Costochondritis   . Depression   . Diabetes mellitus 2000   Type 2, Uncontrolled.Takes Lantus daily.Fasting blood sugar runs 150  . Dizziness    occasionally  . Drug-seeking behavior   . GERD (gastroesophageal reflux disease)    takes Pantoprazole and Zantac daily  . Headache    migraine-last one about a yr ago.Topamax daily  . HLD (hyperlipidemia)    takes Atorvastatin daily  . Hypertension    takes Lisinopril and Coreg daily  . Morbid obesity ([place])   . Muscle spasm    takes Flexeril as needed  . Nocturia   . Obstructive sleep apnea   . Peripheral neuropathy    takes Gabapentin daily  . Pneumonia     "walking" several yrs ago and as a baby  . Rectal fissure   . Restless leg   . Urinary frequency   . Varicose veins    Right medial thigh and Left leg     Past Surgical History:  Procedure Laterality Date  . AMPUTATION Left 02/01/2018   Procedure: LEFT FOURTH AND 5TH TOE RAY AMPUTATION;  Surgeon: [name], MD;  Location: [facility];  Service: Orthopedics;  Laterality: Left;  . AMPUTATION Left 03/03/2018   Procedure: LEFT BELOW KNEE AMPUTATION;  Surgeon: [name], MD;  Location: [facility];  Service: Orthopedics;  Laterality: Left;  . CARPAL TUNNEL RELEASE Bilateral   . CESAREAN SECTION    . KNEE ARTHROSCOPY Right 07/17/2010  . LEFT HEART CATHETERIZATION WITH CORONARY ANGIOGRAM N/A 07/27/2012   Procedure: LEFT HEART CATHETERIZATION WITH CORONARY ANGIOGRAM;  Surgeon: Sherren Mocha, MD;  Location: [facility] CATH [facility];  Service: Cardiovascular;  Laterality: N/A;  . left knee surgery     screws she thinks  . MASS EXCISION N/A 06/29/2013   Procedure:  WIDE LOCAL EXCISION OF POSTERIOR NECK ABSCESS;  Surgeon: Ralene Ok, MD;  Location: [facility];  Service: General;  Laterality: N/A;    There were no vitals filed for  this visit.  Subjective Assessment - 08/30/18 1155    Subjective  No new complaints. No falls. Does have an ingrown toe nail, see's wound care on 09/11/18. Still on the anitbiotic the hospital gave her as she was taking it incorrectly at first. She was taking 1 table 1x a day, was supposed to be taking 3 tablets 3 x a day. Now taking as she is suppossed to after her daughter discovered the correct dosage.    Pertinent History  LTTA, OA, COPD, asthma, DM2, Bipolar    Limitations  Lifting;Standing;Walking;House hold activities    Patient Stated Goals  To use prosthesis to walk in home & community, She would like to be more active with 40yo daughter.     Currently in Pain?  No/denies    Pain Score  0-No pain           08/30/18 0001  Transfers  Transfers Sit to Stand;Stand to Sit   Sit to Stand 5: Supervision;With upper extremity assist;With armrests;From chair/3-in-1  Sit to Stand Details (indicate cue type and reason) needs RW or sturdy surface to stabilize on with standing   Stand to Sit 5: Supervision;With upper extremity assist;With armrests;To chair/3-in-1  Stand to Sit Details needs RW or sturdy surface for stability with sitting down  Ambulation/Gait  Ambulation/Gait Yes  Ambulation/Gait Assistance 4: Min guard;5: Supervision  Ambulation/Gait Assistance Details cues on posture, step length and weight shifting. cues for decreased UE reliance on RW with gait.   Ambulation Distance (Feet) 100 Feet (x2)  Assistive device Rolling walker;Prosthesis  Gait Pattern Step-through pattern;Decreased stride length;Decreased step length - right;Decreased step length - left;Trunk flexed;Narrow base of support;Decreased stance time - left;Decreased weight shift to left  Ambulation Surface Level;Indoor  Neuro Re-ed   Neuro Re-ed Details  for balance/proprioception: pt educated on sink HEP to address balance and proprioception. min guard assist for balance with cues on stance position and ex form/technique.    Prosthetics  Prosthetic Care Comments  pt to increase wear to 2 hours 2x a day. Pt educated in sweat management- signs of sweating and use of anti-perspirant to control sweating on lower limb. Pt also educated on use of baby oil on patella/upper thigh to decrease friction rubbing of liner with wear/mobility.   Current prosthetic wear tolerance (days/week)  recieved prosthesis yesterday, worn daily since then  Current prosthetic wear tolerance (#hours/day)  1 hour each day  Residual limb condition  intact without issues  Education Provided Residual limb care;Proper Donning;Proper Doffing;Proper wear schedule/adjustment;Proper weight-bearing schedule/adjustment;Other (comment) (sink HEP)  Person(s) Educated Patient  Education Method Explanation;Demonstration;Verbal  cues;Handout  Education Method Verbalized understanding;Returned demonstration;Verbal cues required;Needs further instruction  Donning Prosthesis 5  Doffing Prosthesis 5         PT Education - 08/30/18 1220    Education Details  sink HEP for balance/proprioception    Person(s) Educated  Patient    Methods  Explanation;Demonstration;Verbal cues;Handout    Comprehension  Verbalized understanding;Returned demonstration;Need further instruction;Verbal cues required       PT Short Term Goals - 08/09/18 2112      PT SHORT TERM GOAL #1   Title  Patient demonstrates proper donning of prosthesis & verbalizes proper cleaning. (All STGs 3rd visit after evaluation)    Baseline  Patient is dependent in all aspects of prosthetic care.     Time  3    Period  Weeks    Status  New      PT SHORT TERM GOAL #2  Title  Patient tolerates wear of prosthesis >5 hrs total / day without skin issues. (All STGs 3rd visit after evaluation)    Baseline  Patient wore prosthesis 4 of 7 days for 30 minutes that she had prosthesis prior to returning to prosthetist for socket redo.     Time  3    Period  Weeks    Status  New      PT SHORT TERM GOAL #3   Title  Pateint performs standing balance with RW support & prosthesis reaching 2" with supervision. (All STGs 3rd visit after evaluation)    Baseline  Patient performs standing balance with RW with left knee kneeling on mat table to simulate prosthetic support with minA for reaching.     Time  3    Period  Weeks    Status  New      PT SHORT TERM GOAL #4   Title  Patient ambulates 83' with rolling walker & prosthesis with minimal guard. (All STGs 3rd visit after evaluation)    Baseline  Patient hops with rolling walker with minA.     Time  3    Period  Weeks    Status  New        PT Long Term Goals - 08/09/18 2118      PT LONG TERM GOAL #1   Title  Patient verbalizes & demonstrates proper prosthetic care to enable safe use of prosthesis. (All LTGs  Target Date 24 visits after evaluation)    Baseline  Patient is dependent in all prosthetic care.     Time  3    Period  Months    Status  New      PT LONG TERM GOAL #2   Title  Patient tolerates prosthesis wear >90% of awake hours to enable function throughout her day. (All LTGs Target Date 24 visits after evaluation)    Baseline  Patient wore prosthesis 4 of 7 days for 30 minutes.     Time  3    Period  Months    Status  New      PT LONG TERM GOAL #3   Title  Patient performs standing balance with prosthesis & walker or counter support: reaching 10" anteriorly, picking up objects from floor, scanning environment & managing clothes for toileting modified independent.    Baseline  Patient requires minA to reach 2" with RW support & left knee kneeling on mat table to simulate prosthetic support.     Time  3    Period  Months    Status  New      PT LONG TERM GOAL #4   Title  Patient ambulates 200' with RW & prosthesis modified independent to enable community access.     Baseline  Patient hops 10' with RW with minimal assist. History of 12+ falls with mobility without prosthesis.     Time  3    Period  Months    Status  New      PT LONG TERM GOAL #5   Title  Patient negotiates ramps & curbs with RW and stairs with single rail & cane modified independent to enable community access.     Baseline  Patient is dependent in gait in home & non-ambulatory out of house (w/c bound)    Time  3    Period  Months    Status  New            Plan - 08/30/18 1206  Clinical Impression Statement  Today's skilled session focused on prosthetic education, issuing of HEP to address balance/proprioception and gait with RW. The pt is progressing well toward STGs and should benefit from continued PT to progress toward unmet goals.     Rehab Potential  Good    PT Frequency  2x / week   1x/wk for 3 weeks & 2x/wk for 10 weeks   PT Duration  Other (comment)   13 weeks total   PT  Treatment/Interventions  ADLs/Self Care Home Management;Moist Heat;Ultrasound;DME Instruction;Canalith Repostioning;Gait training;Stair training;Functional mobility training;Therapeutic activities;Therapeutic exercise;Balance training;Neuromuscular re-education;Patient/family education;Prosthetic Training;Manual techniques;Vestibular    PT Next Visit Plan  gait with RW, initiate barriers, begin to address balance.     Consulted and Agree with Plan of Care  Patient       Patient will benefit from skilled therapeutic intervention in order to improve the following deficits and impairments:  Abnormal gait, Decreased activity tolerance, Decreased balance, Decreased endurance, Decreased knowledge of precautions, Decreased knowledge of use of DME, Decreased mobility, Decreased range of motion, Decreased skin integrity, Decreased scar mobility, Decreased strength, Dizziness, Increased edema, Postural dysfunction, Prosthetic Dependency, Obesity, Pain  Visit Diagnosis: Muscle weakness (generalized)  Abnormal posture  Other abnormalities of gait and mobility  Unsteadiness on feet     Problem List Patient Active Problem List   Diagnosis Date Noted  . Allergic rhinitis 03/06/2018  . Unilateral complete BKA, left, sequela ([place])   . Decreased pedal pulses 06/10/2017  . Unilateral primary osteoarthritis, right knee 10/22/2016  . Diabetic neuropathy ([place]) 07/14/2016  . Syncope 02/25/2016  . De Quervain's tenosynovitis, bilateral 11/01/2015  . Vitamin D deficiency 09/05/2015  . Recurrent candidiasis of vagina 09/05/2015  . Varicose veins of leg with complications 31/51/7616  . Restless leg syndrome 10/17/2014  . Chronic sinusitis 07/18/2014  . Headache 07/15/2014  . Urge incontinence 10/15/2013  . Encounter for chronic pain management 06/30/2013  . HLD (hyperlipidemia) 11/19/2012  . Chest pain 06/27/2012  . Right carpal tunnel syndrome 09/01/2011  . Bilateral knee pain 09/01/2011  . Diabetes  mellitus ([place]) 05/22/2008  . Morbid obesity ([place]) 05/22/2008  . 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 Depression 05/22/2008  . Obstructive sleep apnea 05/22/2008  . Hypertension 05/22/2008  . Asthma 05/22/2008  . GERD 05/22/2008    Willow Ora, PTA, [facility] [address] [place], [place] [address] [phone] 08/31/18, 1:11 PM   Name: Bellany Elbaum MRN: [id number] Date of Birth: [birth year]

## 2018-09-01 DIAGNOSIS — Z6841 Body Mass Index (BMI) 40.0 and over, adult: Secondary | ICD-10-CM | POA: Diagnosis not present

## 2018-09-01 DIAGNOSIS — M2550 Pain in unspecified joint: Secondary | ICD-10-CM | POA: Diagnosis not present

## 2018-09-02 DIAGNOSIS — M2550 Pain in unspecified joint: Secondary | ICD-10-CM | POA: Diagnosis not present

## 2018-09-02 DIAGNOSIS — Z6841 Body Mass Index (BMI) 40.0 and over, adult: Secondary | ICD-10-CM | POA: Diagnosis not present

## 2018-09-03 DIAGNOSIS — Z6841 Body Mass Index (BMI) 40.0 and over, adult: Secondary | ICD-10-CM | POA: Diagnosis not present

## 2018-09-03 DIAGNOSIS — M2550 Pain in unspecified joint: Secondary | ICD-10-CM | POA: Diagnosis not present

## 2018-09-04 DIAGNOSIS — M2550 Pain in unspecified joint: Secondary | ICD-10-CM | POA: Diagnosis not present

## 2018-09-04 DIAGNOSIS — Z6841 Body Mass Index (BMI) 40.0 and over, adult: Secondary | ICD-10-CM | POA: Diagnosis not present

## 2018-09-05 DIAGNOSIS — J9620 Acute and chronic respiratory failure, unspecified whether with hypoxia or hypercapnia: Secondary | ICD-10-CM | POA: Diagnosis not present

## 2018-09-05 DIAGNOSIS — E669 Obesity, unspecified: Secondary | ICD-10-CM | POA: Diagnosis not present

## 2018-09-05 DIAGNOSIS — J45909 Unspecified asthma, uncomplicated: Secondary | ICD-10-CM | POA: Diagnosis not present

## 2018-09-05 DIAGNOSIS — M86172 Other acute osteomyelitis, left ankle and foot: Secondary | ICD-10-CM | POA: Diagnosis not present

## 2018-09-05 DIAGNOSIS — G4733 Obstructive sleep apnea (adult) (pediatric): Secondary | ICD-10-CM | POA: Diagnosis not present

## 2018-09-06 ENCOUNTER — Encounter: Payer: Self-pay | Admitting: Physical Therapy

## 2018-09-08 ENCOUNTER — Ambulatory Visit: Payer: Medicaid Other | Attending: Physical Medicine & Rehabilitation | Admitting: Physical Therapy

## 2018-09-08 DIAGNOSIS — R2681 Unsteadiness on feet: Secondary | ICD-10-CM | POA: Insufficient documentation

## 2018-09-08 DIAGNOSIS — M6281 Muscle weakness (generalized): Secondary | ICD-10-CM | POA: Insufficient documentation

## 2018-09-08 DIAGNOSIS — R2689 Other abnormalities of gait and mobility: Secondary | ICD-10-CM | POA: Insufficient documentation

## 2018-09-08 DIAGNOSIS — R293 Abnormal posture: Secondary | ICD-10-CM | POA: Insufficient documentation

## 2018-09-11 ENCOUNTER — Encounter

## 2018-09-11 ENCOUNTER — Ambulatory Visit: Payer: Medicaid Other | Admitting: Podiatry

## 2018-09-11 ENCOUNTER — Ambulatory Visit: Payer: Medicaid Other | Admitting: Physical Therapy

## 2018-09-11 DIAGNOSIS — L600 Ingrowing nail: Secondary | ICD-10-CM

## 2018-09-11 MED ORDER — SULFAMETHOXAZOLE-TRIMETHOPRIM 800-160 MG PO TABS: 1.0000 | ORAL_TABLET | Freq: Two times a day (BID) | ORAL | 0 refills | Status: DC

## 2018-09-11 MED ORDER — GENTAMICIN SULFATE 0.1 % EX CREA: 1.0000 "application " | TOPICAL_CREAM | Freq: Three times a day (TID) | CUTANEOUS | 1 refills | Status: DC

## 2018-09-11 NOTE — Patient Instructions (Signed)

## 2018-09-12 ENCOUNTER — Encounter: Payer: Self-pay | Admitting: Physical Therapy

## 2018-09-13 NOTE — Progress Notes (Signed)
Subjective: Patient presents today for evaluation of throbbing pain to the medial border of the right hallux that began 3 months ago. Patient is concerned for possible ingrown nail. She states her sock rubbing against the toe increases the pain. She has taken antibiotics in the past for treatment with some relief but states the symptoms returned. Patient presents today for further treatment and evaluation.  Past Medical History:  Diagnosis Date  . Acute osteomyelitis of ankle or foot, left ([facility]) 01/31/2018  . Alveolar hypoventilation   . Anemia    not on iron pill  . Asthma   . Bipolar 2 disorder ([place])   . Carpal tunnel syndrome on right    recurrent  . Cellulitis 08/2010-08/2011  . Chronic pain   . COPD (chronic obstructive pulmonary disease) ([name])    Symbicort daily and Proventil as needed  . Costochondritis   . Depression   . Diabetes mellitus 2000   Type 2, Uncontrolled.Takes Lantus daily.Fasting blood sugar runs 150  . Dizziness    occasionally  . Drug-seeking behavior   . GERD (gastroesophageal reflux disease)    takes Pantoprazole and Zantac daily  . Headache    migraine-last one about a yr ago.Topamax daily  . HLD (hyperlipidemia)    takes Atorvastatin daily  . Hypertension    takes Lisinopril and Coreg daily  . Morbid obesity ([place])   . Muscle spasm    takes Flexeril as needed  . Nocturia   . Obstructive sleep apnea   . Peripheral neuropathy    takes Gabapentin daily  . Pneumonia    "walking" several yrs ago and as a baby  . Rectal fissure   . Restless leg   . Urinary frequency   . Varicose veins    Right medial thigh and Left leg     Objective:  General: Well developed, nourished, in no acute distress, alert and oriented x3   Dermatology: Skin is warm, dry and supple bilateral. Medial border of the right hallux appears to be erythematous with evidence of an ingrowing nail. Pain on palpation noted to the border of the nail fold. Erythema and edema noted to  the right hallux. The remaining nails appear unremarkable at this time. There are no open sores, lesions.  Vascular: Dorsalis Pedis artery and Posterior Tibial artery pedal pulses palpable. No lower extremity edema noted.   Neruologic: Grossly intact via light touch bilateral.  Musculoskeletal: Muscular strength within normal limits in all groups bilateral. Normal range of motion noted to all pedal and ankle joints.   Assesement: #1 Paronychia with ingrowing nail medial border right hallux  #2 Pain in toe #3 Incurvated nail #4 cellulitis right hallux   Plan of Care:  1. Patient evaluated.  2. Discussed treatment alternatives and plan of care. Explained nail avulsion procedure and post procedure course to patient. 3. Patient opted for temporary partial nail avulsion.  4. Prior to procedure, local anesthesia infiltration utilized using 3 ml of a 50:50 mixture of 2% plain lidocaine and 0.5% plain marcaine in a normal hallux block fashion and a betadine prep performed.  5. Culture taken from wound.  6. Prescription for Bactrim DS provided to patient.  7. Prescription for Gentamicin cream provided to patient.  8. Return to clinic in two weeks.   Edrick Kins, DPM [facility]  Dr. Edrick Kins, DPM    [place]  [place], [place] [address]                Office [phone]  Fax [phone]

## 2018-09-14 ENCOUNTER — Ambulatory Visit: Payer: Medicaid Other | Admitting: Physical Therapy

## 2018-09-14 DIAGNOSIS — M2550 Pain in unspecified joint: Secondary | ICD-10-CM | POA: Diagnosis not present

## 2018-09-14 DIAGNOSIS — Z6841 Body Mass Index (BMI) 40.0 and over, adult: Secondary | ICD-10-CM | POA: Diagnosis not present

## 2018-09-14 LAB — WOUND CULTURE
MICRO NUMBER: 91202357
SPECIMEN QUALITY:: ADEQUATE

## 2018-09-18 DIAGNOSIS — E1142 Type 2 diabetes mellitus with diabetic polyneuropathy: Secondary | ICD-10-CM | POA: Diagnosis not present

## 2018-09-19 ENCOUNTER — Ambulatory Visit: Payer: Medicaid Other | Admitting: Physical Therapy

## 2018-09-19 ENCOUNTER — Ambulatory Visit: Payer: Medicaid Other | Admitting: Licensed Clinical Social Worker

## 2018-09-19 ENCOUNTER — Ambulatory Visit (INDEPENDENT_AMBULATORY_CARE_PROVIDER_SITE_OTHER): Payer: Medicaid Other | Admitting: Family Medicine

## 2018-09-19 ENCOUNTER — Other Ambulatory Visit: Payer: Self-pay

## 2018-09-19 VITALS — BP 108/72 | HR 81 | Temp 98.5°F

## 2018-09-19 DIAGNOSIS — G44229 Chronic tension-type headache, not intractable: Secondary | ICD-10-CM | POA: Diagnosis not present

## 2018-09-19 DIAGNOSIS — G8929 Other chronic pain: Secondary | ICD-10-CM

## 2018-09-19 DIAGNOSIS — L03031 Cellulitis of right toe: Secondary | ICD-10-CM | POA: Diagnosis not present

## 2018-09-19 DIAGNOSIS — F329 Major depressive disorder, single episode, unspecified: Secondary | ICD-10-CM

## 2018-09-19 DIAGNOSIS — E0810 Diabetes mellitus due to underlying condition with ketoacidosis without coma: Secondary | ICD-10-CM | POA: Diagnosis not present

## 2018-09-19 DIAGNOSIS — M25561 Pain in right knee: Secondary | ICD-10-CM | POA: Diagnosis not present

## 2018-09-19 DIAGNOSIS — F32A Depression, unspecified: Secondary | ICD-10-CM

## 2018-09-19 DIAGNOSIS — G894 Chronic pain syndrome: Secondary | ICD-10-CM | POA: Diagnosis not present

## 2018-09-19 DIAGNOSIS — E1151 Type 2 diabetes mellitus with diabetic peripheral angiopathy without gangrene: Secondary | ICD-10-CM | POA: Diagnosis not present

## 2018-09-19 MED ORDER — HYDROCODONE-ACETAMINOPHEN 10-325 MG PO TABS: 1.0000 | ORAL_TABLET | Freq: Three times a day (TID) | ORAL | 0 refills | Status: DC | PRN

## 2018-09-19 MED ORDER — NORTRIPTYLINE HCL 10 MG PO CAPS: 10.0000 mg | ORAL_CAPSULE | Freq: Every day | ORAL | 1 refills | Status: DC

## 2018-09-19 NOTE — Assessment & Plan Note (Signed)
A1c at last visit improved to 9.6.  We discussed this is a success.  The patient has been taking her insulin more regularly.  We will follow-up in 2 months time for repeat A1c.  She is on an ACE inhibitor as well as a statin.  She continues to take an aspirin daily.  She is also on metformin therapy.  She declined a flu shot today

## 2018-09-19 NOTE — Patient Instructions (Addendum)
Please be sure to soak your toe once a day and apply the ointment prescribed  It was wonderful to see you today.  Thank you for choosing [facility].   Please call [phone] with any questions about today's appointment.  Please be sure to schedule follow up at the front  desk before you leave today.   Dorris Singh, MD  Family Medicine

## 2018-09-19 NOTE — BH Specialist Note (Signed)
Integrated Behavioral Health Warm Handoff  MRN: [id number] Name: [name] Session Start time: 9:15  Session End time: 9:30 Total time: 15 minutes  Type of Service: [place] Interpretor:No. Interpretor Name and Language: NA   Warm Hand Off Completed.     Patient verbally consented to PCP to meet with [facility] Consultant about presenting concerns. SUBJECTIVE: [name] is a 42 y.o. female accompanied by her 45 year old daughter during this visit. Patient referred by Dr. Owens Shark for assistance with managing depression.  Patient reports trying to connect to services with [facility] last week but unable to get in the building . Mental Health History: patient reports having home therapy for several years with [facility] she tried to reach out to therapist but states therapist has not called her back. Patient also reports going to [facility] for services in the past and may consider go back.  Patient lethargic and a bit guarded, not wanting to make any commitments at this time. Issues discussed: previous and current therapy options as well as [employer] services. ASSESSMENT: Patient is currently experiencing symptoms of  Depression, per PCP patietn is not interested in medication at this time.  Patient may benefit from  further assessment and therapeutic interventions to assist with managing her symptoms, however she does not want to make a decision today.  LCSW provided patient with a list of local therapist. She is open to a F/U call to see if she was able to connect to a provider or schedule an appointment in the integrated [facility]. PLAN: 1. Patient will review list of therapist provided 2. Behavioral recommendations: none today 3. Referral(s): [place] [facility] (In Clinic) and Counselor which ever works best for patient. 4. [facility] will F/U with patient in 5 to 7 days. [place],  [facility]   [phone] 10:00 AM

## 2018-09-19 NOTE — Assessment & Plan Note (Signed)
Reviewed benefits and risks of this medication with the patient.  In this patient the benefits outweigh the risks.  Reviewed her prescription monitoring program.  There are no flags.  Refilled medication for 1 more month in November she will need a prescription in December 2019.

## 2018-09-19 NOTE — Progress Notes (Signed)
Patient Name: [name] Date of Birth: 08-15-1976 Date of Visit: 09/19/18 PCP: [name], MD  Chief Complaint: right great toe problem, not sleeping, headache, low mood, change in marital status    Subjective: [name] is a pleasant 42 y.o. year old woman with history of poorly controlled type 2 diabetes, peripheral vascular disease status post below the knee amputation, morbid obesity, depression, chronic obstructive lung disease, and chronic back and lower extremity pain on narcotic medication presenting today for routine follow-up.  At her last visit [name] reported that is under significant stressors in her marriage.  She reports since that time her mood is okay. She reports her relationship with her husband has been poor for some time. He is no longer living in the home. She has not spoken to him since Friday. Reports poor sleep and low mood. She might be interested in counseling- she went to [facility] for an intake appointment due to her low mood. She reports they were closed for construction (had gates up outside). She is interested in talking with Integrated Care today. Denies SI/HI.   In terms of her medications Mrs. [name] reports adherence to her medications. Her AM BG are in the 100's.  [name] reports her pain is controlled. She received a two month supply of Norco 10-325 at her last visit. She denies excessive sleepiness, nausea, constipation, or sedation. She does not drive or work currently.   [name] had an ingrown toenail (medial left great toenail fold) removed earlier this month. She is completing a course of Bactrim DS for this. She reports she has had some worsening pain over the toe for the past week. She reports the erythema is similar. The drainage is unchanged. She has been applying gentamicin ointment once to twice daily. She is not soaking her toe. She has follow up in 2 weeks with [facility]. Denies fevers, chills, nausea, vomiting, worsening leg  pain.  [name] reports daytime headaches. She has a history of chronic daily headaches, worsened over the last month. She drinks no caffeine. She has always had poor sleep. She wears a CPAP at night. Headaches are bitemporal, without nausea, vomiting, photophobia, phonophobia.  She has not been using NSAIDs.   [name] has been participating in PT. She has been using her left prosthesis.  She has some pain with this but persists and continues to use this   ROS:  ROS As above. Negative for blurry vision, nighttime headaches, fevers, chills, nausea, vomiting.   I have reviewed the patient's medical, surgical, family, and social history as appropriate.   Vitals:   09/19/18 0836  BP: 108/72  Pulse: 81  Temp: 98.5 F (36.9 C)  SpO2: 99%   Tired, affect is flat HEENT: Sclera anicteric. Dentition is poor. Appears well hydrated. Neck: Supple Cardiac: Regular rate and rhythm. Normal S1/S2. No murmurs, rubs, or gallops appreciated. Lungs: Clear bilaterally to ascultation.  Abdomen: Obese distended nontender Extremities: Warm, well perfused without edema.  Skin: Right great toe fully undressed.  There is a healing area along the medial nail fold with overlying ointment.  Drainage is clear.  The entirety of the toe to the proximal joint is mildly erythematous.  It is nontender to touch.  It is not warm in nature.  [name] was seen today for follow-up.  Diagnoses and all orders for this visit:  Social Stressors, the patient has a history of bipolar disorder as well as a complex social situation.  Warm hand  off completed to behavioral health.  The patient did not schedule an appointment with integrated health but will think about this.  She is to call next week.  Patient has been through number of stressors recently and would benefit from intensive counseling.  Also discussed referral to psychiatry for management of bipolar disorder we will continue to address at upcoming visits  Type II  diabetes mellitus with peripheral angiopathy ([place]), poorly controlled,  -     Basic Metabolic Panel -     Lipid Panel -  The patient is on a statin and ACE inhibitor. Monitoring as above.  -  Metformin and insulin therapy.  - Patients AM BG have been 100-120s.   -  Follow up in 2 months for diabetes.   Paronychia of toe of right foot due to ingrown toenail the patient has had persistent erythema and pain over the site.  She reports this pain has worsened.  The erythema and drainage are unchanged.  Will obtain a CBC with differential.  His white blood cell count is markedly elevated will consider extending duration of Bactrim or adding coverage with cephalexin in addition to Bactrim.  Reviewed reasons to call and return to care.  Recommended patient make a closer follow-up appointment with her podiatrist.  She is at high risk of progression of any infection and also high risk of developing complications from her wound given her elevated blood sugars. -     CBC with Differential  Chronic pain syndrome carefully reviewed her [place] prescription monitoring program.  Reviewed benefits and risks of the medication the benefits of this medication outweigh the risks.  We reviewed the long-term health consequences of narcotic use.  Her refills have been appropriate.  This is for her November refill.  Her next refills will be due in December at which time she will see Dr. [name]. -     HYDROcodone-acetaminophen (NORCO) 10-325 MG tablet; Take 1 tablet by mouth every 8 (eight) hours as needed.  Chronic tension-type headache, not intractable the patient has had prior neuroimaging.  I suspect her headaches are multifactorial in nature given her chronic uncontrolled right leg pain related to her peripheral neuropathy from diabetes as well as chronic pain syndrome she may benefit from a tricyclic.  We will start at a very low dose to reduce the risk of side effects including but not limited to serotonin  syndrome we reviewed the risks of this medication as well as the potential benefits.  Could consider titrating down on gabapentin future and possibly narcotics if this were to be efficacious.  This can be titrated up to 25 mg in the future -     nortriptyline (PAMELOR) 10 MG capsule; Take 1 capsule (10 mg total) by mouth at bedtime.  Dorris Singh, MD  Family Medicine Teaching Service

## 2018-09-19 NOTE — Assessment & Plan Note (Signed)
>>  ASSESSMENT AND PLAN FOR INSULIN DEPENDENT TYPE 2 DIABETES MELLITUS ([place]) WRITTEN ON 09/19/2018 10:46 AM BY BROWN, CARINA M, MD  A1c at last visit improved to 9.6.  We discussed this is a success.  The patient has been taking her insulin more regularly.  We will follow-up in 2 months time for repeat A1c.  She is on an ACE inhibitor as well as a statin.  She continues to take an aspirin daily.  She is also on metformin therapy.  She declined a flu shot today

## 2018-09-20 ENCOUNTER — Telehealth: Payer: Self-pay | Admitting: Family Medicine

## 2018-09-20 LAB — BASIC METABOLIC PANEL
BUN/Creatinine Ratio: 22 (ref 9–23)
BUN: 22 mg/dL (ref 6–24)
CHLORIDE: 98 mmol/L (ref 96–106)
CO2: 20 mmol/L (ref 20–29)
CREATININE: 0.99 mg/dL (ref 0.57–1.00)
Calcium: 9.6 mg/dL (ref 8.7–10.2)
GFR calc non Af Amer: 72 mL/min/{1.73_m2} (ref >59)
GFR, EST AFRICAN AMERICAN: 82 mL/min/{1.73_m2} (ref >59)
Glucose: 367 mg/dL — ABNORMAL HIGH (ref 65–99)
POTASSIUM: 4.4 mmol/L (ref 3.5–5.2)
Sodium: 135 mmol/L (ref 134–144)

## 2018-09-20 LAB — CBC WITH DIFFERENTIAL/PLATELET
BASOS: 1 %
Basophils Absolute: 0.1 10*3/uL (ref 0.0–0.2)
EOS (ABSOLUTE): 0.2 10*3/uL (ref 0.0–0.4)
EOS: 2 %
Hematocrit: 39.7 % (ref 34.0–46.6)
Hemoglobin: 13.1 g/dL (ref 11.1–15.9)
IMMATURE GRANULOCYTES: 1 %
Immature Grans (Abs): 0.1 10*3/uL (ref 0.0–0.1)
LYMPHS ABS: 4.5 10*3/uL — AB (ref 0.7–3.1)
Lymphs: 41 %
MCH: 29.4 pg (ref 26.6–33.0)
MCHC: 33 g/dL (ref 31.5–35.7)
MCV: 89 fL (ref 79–97)
MONOCYTES: 7 %
MONOS ABS: 0.8 10*3/uL (ref 0.1–0.9)
Neutrophils Absolute: 5.5 10*3/uL (ref 1.4–7.0)
Neutrophils: 48 %
PLATELETS: 425 10*3/uL (ref 150–450)
RBC: 4.46 x10E6/uL (ref 3.77–5.28)
RDW: 12.8 % (ref 12.3–15.4)
WBC: 11.2 10*3/uL — AB (ref 3.4–10.8)

## 2018-09-20 LAB — LIPID PANEL
Chol/HDL Ratio: 9.8 ratio — ABNORMAL HIGH (ref 0.0–4.4)
Cholesterol, Total: 343 mg/dL — ABNORMAL HIGH (ref 100–199)
HDL: 35 mg/dL — AB (ref >39)
Triglycerides: 456 mg/dL — ABNORMAL HIGH (ref 0–149)

## 2018-09-20 NOTE — Telephone Encounter (Signed)
Attempted to call patient twice.  Her phone appears to be off at this time.  Nursing please try to call patient later today or tomorrow.  Her labs overall look quite good.  Her white blood count is slightly elevated from prior.  Given the persistence of her erythema around her foot I would like to extend her course of her Bactrim for an additional 4 days to make a 2-week course.  Her cholesterol is quite elevated.  When she comes back to see Dr. [name] or myself she should be fasting (meaning having not eaten for 6 hours).  Please also asked the patient if she has had difficulty affording or taking her atorvastatin medication.  We may need to increase this dose in the future.   I am happy to chat with her if she has questions.

## 2018-09-20 NOTE — Telephone Encounter (Signed)
Attempted to call pt but phone not in service. Will try again later. Deseree Kennon Holter, CMA

## 2018-09-21 ENCOUNTER — Ambulatory Visit: Payer: Medicaid Other | Admitting: Physical Therapy

## 2018-09-21 MED ORDER — SULFAMETHOXAZOLE-TRIMETHOPRIM 800-160 MG PO TABS: 1.0000 | ORAL_TABLET | Freq: Two times a day (BID) | ORAL | 0 refills | Status: DC

## 2018-09-21 NOTE — Telephone Encounter (Signed)
Attempted to call, patient's phone out of service.

## 2018-09-25 DIAGNOSIS — Z6841 Body Mass Index (BMI) 40.0 and over, adult: Secondary | ICD-10-CM | POA: Diagnosis not present

## 2018-09-25 DIAGNOSIS — M2550 Pain in unspecified joint: Secondary | ICD-10-CM | POA: Diagnosis not present

## 2018-09-25 NOTE — Telephone Encounter (Signed)
Per Deborah's warm hand-off plan on 09/19/18, [facility] intern called patient to gauge interest in integrated care follow-up appointments for short-term counseling to help manage her depressive symptoms. Patient reported that she would like another day to think about this. She asked for [facility] to call her back tomorrow to make a final decision. [facility] intern emailed Neoma Laming to follow-up and call patient tomorrow.

## 2018-09-26 ENCOUNTER — Telehealth: Payer: Self-pay | Admitting: Licensed Clinical Social Worker

## 2018-09-26 ENCOUNTER — Encounter: Payer: Self-pay | Admitting: Physical Therapy

## 2018-09-26 DIAGNOSIS — Z6841 Body Mass Index (BMI) 40.0 and over, adult: Secondary | ICD-10-CM | POA: Diagnosis not present

## 2018-09-26 DIAGNOSIS — M2550 Pain in unspecified joint: Secondary | ICD-10-CM | POA: Diagnosis not present

## 2018-09-26 NOTE — Progress Notes (Signed)
Service : Bent F/U Call   F/U call to patient reference getting connected to integrated care services or ongoing therapy.  Left message to call office and schedule appointment if she decides she is interested in [facility] services.  Plan: Will wait for return call.  Casimer Lanius, [facility]   [phone] 1:50 PM

## 2018-09-27 DIAGNOSIS — M2550 Pain in unspecified joint: Secondary | ICD-10-CM | POA: Diagnosis not present

## 2018-09-27 DIAGNOSIS — Z6841 Body Mass Index (BMI) 40.0 and over, adult: Secondary | ICD-10-CM | POA: Diagnosis not present

## 2018-09-28 ENCOUNTER — Ambulatory Visit: Payer: Medicaid Other | Admitting: Physical Therapy

## 2018-09-28 DIAGNOSIS — M2550 Pain in unspecified joint: Secondary | ICD-10-CM | POA: Diagnosis not present

## 2018-09-28 DIAGNOSIS — Z6841 Body Mass Index (BMI) 40.0 and over, adult: Secondary | ICD-10-CM | POA: Diagnosis not present

## 2018-09-29 ENCOUNTER — Encounter: Payer: Self-pay | Admitting: Physical Therapy

## 2018-09-29 ENCOUNTER — Ambulatory Visit: Payer: Medicaid Other | Admitting: Physical Therapy

## 2018-09-29 DIAGNOSIS — M6281 Muscle weakness (generalized): Secondary | ICD-10-CM | POA: Diagnosis not present

## 2018-09-29 DIAGNOSIS — R293 Abnormal posture: Secondary | ICD-10-CM | POA: Diagnosis not present

## 2018-09-29 DIAGNOSIS — R2681 Unsteadiness on feet: Secondary | ICD-10-CM

## 2018-09-29 DIAGNOSIS — R2689 Other abnormalities of gait and mobility: Secondary | ICD-10-CM | POA: Diagnosis not present

## 2018-10-01 NOTE — Therapy (Addendum)
[facility] [address] [place], [place], [address] Phone: [phone]   Fax:  [phone]  Physical Therapy Treatment  Patient Details  Name: Ryn Peine MRN: [id number] Date of Birth: [birth year] Referring Provider (PT): Rolena Infante, MD   Encounter Date: 09/29/2018   09/29/18 1456  PT Visits / Re-Eval  Visit Number 3 (10/02/18: number updated due to mis numbering)  Number of Visits 25  Authorization  Authorization Type Medicaid  Authorization Time Period 9/16-10/6/19: 3 visits approved; 12 visits approved from 10/10-11/20/19  Authorization - Visit Number 2  Authorization - Number of Visits 15  PT Time Calculation  PT Start Time 0511  PT Stop Time 1530  PT Time Calculation (min) 43 min  PT - End of Session  Equipment Utilized During Treatment Gait belt  Activity Tolerance Patient limited by fatigue;Patient tolerated treatment well;No increased pain;Other (comment)  Behavior During Therapy WFL for tasks assessed/performed (tearful)     Past Medical History:  Diagnosis Date  . Acute osteomyelitis of ankle or foot, left ([place]) 01/31/2018  . Alveolar hypoventilation   . Anemia    not on iron pill  . Asthma   . Bipolar 2 disorder ([place])   . Carpal tunnel syndrome on right    recurrent  . Cellulitis 08/2010-08/2011  . Chronic pain   . COPD (chronic obstructive pulmonary disease) ([name])    Symbicort daily and Proventil as needed  . Costochondritis   . Depression   . Diabetes mellitus 2000   Type 2, Uncontrolled.Takes Lantus daily.Fasting blood sugar runs 150  . Dizziness    occasionally  . Drug-seeking behavior   . GERD (gastroesophageal reflux disease)    takes Pantoprazole and Zantac daily  . Headache    migraine-last one about a yr ago.Topamax daily  . HLD (hyperlipidemia)    takes Atorvastatin daily  . Hypertension    takes Lisinopril and Coreg daily  . Morbid obesity ([place])   . Muscle spasm    takes Flexeril as needed  . Nocturia   . Obstructive sleep apnea   . Peripheral neuropathy    takes Gabapentin daily  . Pneumonia    "walking" several yrs ago and as a baby  . Rectal fissure   . Restless leg   . Urinary frequency   . Varicose veins    Right medial thigh and Left leg     Past Surgical History:  Procedure Laterality Date  . AMPUTATION Left 02/01/2018   Procedure: LEFT FOURTH AND 5TH TOE RAY AMPUTATION;  Surgeon: [name], MD;  Location: [facility];  Service: Orthopedics;  Laterality: Left;  . AMPUTATION Left 03/03/2018   Procedure: LEFT BELOW KNEE AMPUTATION;  Surgeon: [name], MD;  Location: [facility];  Service: Orthopedics;  Laterality: Left;  . CARPAL TUNNEL RELEASE Bilateral   . CESAREAN SECTION    . KNEE ARTHROSCOPY Right 07/17/2010  . LEFT HEART CATHETERIZATION WITH CORONARY ANGIOGRAM N/A 07/27/2012   Procedure: LEFT HEART CATHETERIZATION WITH CORONARY ANGIOGRAM;  Surgeon: Sherren Mocha, MD;  Location: [facility] CATH LAB;  Service: Cardiovascular;  Laterality: N/A;  . left knee surgery     screws she thinks  . MASS EXCISION N/A 06/29/2013   Procedure:  WIDE LOCAL EXCISION OF POSTERIOR NECK ABSCESS;  Surgeon: Ralene Ok, MD;  Location: [facility];  Service: General;  Laterality: N/A;    There were no vitals filed for this visit.     09/29/18 1454  Symptoms/Limitations  Subjective No new  complaints. Still dealing with ingrown nail on right foot, kept her from wearing shoe on foot. See's Dr. Amalia Hailey on 10/09/18 (he removed the nail, it is still painful). No falls.   Pertinent History LTTA, OA, COPD, asthma, DM2, Bipolar  Limitations Lifting;Standing;Walking;House hold activities  Patient Stated Goals To use prosthesis to walk in home & community, She would like to be more active with 59yo daughter.   Pain Assessment  Currently in Pain? Yes  Pain Score 7  Pain Location Foot  Pain Orientation Right  Pain Descriptors / Indicators Sharp  Pain Type Acute pain  Pain  Onset 1 to 4 weeks ago  Pain Frequency Constant  Aggravating Factors  weight bearing, shoes  Pain Relieving Factors ointment         09/29/18 1504  Transfers  Transfers Sit to Stand;Stand to Sit  Sit to Stand 5: Supervision;With upper extremity assist;With armrests;From chair/3-in-1  Sit to Stand Details (indicate cue type and reason) continues to need the RW to stabilize on with standing  Stand to Sit 5: Supervision;With upper extremity assist;With armrests;To chair/3-in-1  Stand to Sit Details needs RW support to stabilize on with sitting down  Ambulation/Gait  Ambulation/Gait Yes  Ambulation/Gait Assistance 5: Supervision  Ambulation/Gait Assistance Details cues on posture, step length, base of support and RW position with gait.   Ambulation Distance (Feet) 115 Feet (x1,  150  x1 in/outdoors)  Assistive device Rolling walker;Prosthesis  Gait Pattern Step-through pattern;Decreased stride length;Decreased step length - right;Decreased step length - left;Trunk flexed;Narrow base of support;Decreased stance time - left;Decreased weight shift to left  Ambulation Surface Level;Indoor;Unlevel;Outdoor;Paved;Gravel;Grass;Other (comment) (rubber mulch)  Ramp Other (comment) (min guard assist)  Ramp Details (indicate cue type and reason) PTA demo'd technique prior to pt performance. cues needed during pt performance.   Prosthetics  Prosthetic Care Comments  educated pt on importance of consistently wearing the prosthesis every day for increased time. The pt is to try to wear the prosthesis daily for 2 hours 2x a day. discussed how this will help to incr her mobility as well as it will allow her to start walking around the home as she needs too. She verbalized understanding of all of this.   Current prosthetic wear tolerance (days/week)  reports not wearing the prosthesis everyday. avg of maybe 1-2 days a week.   Current prosthetic wear tolerance (#hours/day)  1 hour each day on days she wears  it  Residual limb condition  intact without issues  Education Provided Residual limb care;Proper wear schedule/adjustment;Proper weight-bearing schedule/adjustment;Other (comment) (see prosthetic care comments )  Person(s) Educated Patient  Education Method Explanation;Demonstration;Verbal cues  Education Method Verbalized understanding;Returned demonstration;Verbal cues required;Needs further instruction  Donning Prosthesis 5  Doffing Prosthesis 5       PT Short Term Goals - 09/29/18 1458      PT SHORT TERM GOAL #1   Title  Patient demonstrates proper donning of prosthesis & verbalizes proper cleaning. (All STGs 3rd visit after evaluation)    Baseline  09/29/18: able to don/doff independently, independent with cleaning/care    Status  Achieved      PT SHORT TERM GOAL #2   Title  Patient tolerates wear of prosthesis >5 hrs total / day without skin issues. (All STGs 3rd visit after evaluation)    Baseline  09/29/18: at least 4 of 7 days a week, at least 3 hours a day with max of 3.5 hours at a time. some days 2x day.     Status  Partially Met      PT SHORT TERM GOAL #3   Title  Pateint performs standing balance with RW support & prosthesis reaching 2" with supervision. (All STGs 3rd visit after evaluation)    Baseline  Patient performs standing balance with RW with left knee kneeling on mat table to simulate prosthetic support with minA for reaching.     Time  3    Period  Weeks    Status  New      PT SHORT TERM GOAL #4   Title  Patient ambulates 41' with rolling walker & prosthesis with minimal guard. (All STGs 3rd visit after evaluation)    Baseline  Patient hops with rolling walker with minA.     Time  3    Period  Weeks    Status  New        PT Long Term Goals - 08/09/18 2118      PT LONG TERM GOAL #1   Title  Patient verbalizes & demonstrates proper prosthetic care to enable safe use of prosthesis. (All LTGs Target Date 24 visits after evaluation)    Baseline   Patient is dependent in all prosthetic care.     Time  3    Period  Months    Status  New      PT LONG TERM GOAL #2   Title  Patient tolerates prosthesis wear >90% of awake hours to enable function throughout her day. (All LTGs Target Date 24 visits after evaluation)    Baseline  Patient wore prosthesis 4 of 7 days for 30 minutes.     Time  3    Period  Months    Status  New      PT LONG TERM GOAL #3   Title  Patient performs standing balance with prosthesis & walker or counter support: reaching 10" anteriorly, picking up objects from floor, scanning environment & managing clothes for toileting modified independent.    Baseline  Patient requires minA to reach 2" with RW support & left knee kneeling on mat table to simulate prosthetic support.     Time  3    Period  Months    Status  New      PT LONG TERM GOAL #4   Title  Patient ambulates 200' with RW & prosthesis modified independent to enable community access.     Baseline  Patient hops 10' with RW with minimal assist. History of 12+ falls with mobility without prosthesis.     Time  3    Period  Months    Status  New      PT LONG TERM GOAL #5   Title  Patient negotiates ramps & curbs with RW and stairs with single rail & cane modified independent to enable community access.     Baseline  Patient is dependent in gait in home & non-ambulatory out of house (w/c bound)    Time  3    Period  Months    Status  New         09/29/18 1457  Plan  Clinical Impression Statement Today's skilled session focused on progress toward STGs and continued to address gait on various surfaces with RW/prosthesis and prosthetic management. The pt continues to be teary at times with session due to multiple issued occuring at home, declines to discuss them at this time. Did inform pt of importance of calling us if she is unable to make her appts as it has been  a month since she was here with multiple no shows and we have date limits with Medicaid. She  verbalized understanding of this. Despite not attending appts or wearing her prosthesis consistently she has made progress and should benefit from continued PT to progress toward unmet goals.   Pt will benefit from skilled therapeutic intervention in order to improve on the following deficits Abnormal gait;Decreased activity tolerance;Decreased balance;Decreased endurance;Decreased knowledge of precautions;Decreased knowledge of use of DME;Decreased mobility;Decreased range of motion;Decreased skin integrity;Decreased scar mobility;Decreased strength;Dizziness;Increased edema;Postural dysfunction;Prosthetic Dependency;Obesity;Pain  Rehab Potential Good  PT Frequency 2x / week (1x/wk for 3 weeks & 2x/wk for 10 weeks)  PT Duration Other (comment) (13 weeks total)  PT Treatment/Interventions ADLs/Self Care Home Management;Moist Heat;Ultrasound;DME Instruction;Canalith Repostioning;Gait training;Stair training;Functional mobility training;Therapeutic activities;Therapeutic exercise;Balance training;Neuromuscular re-education;Patient/family education;Prosthetic Training;Manual techniques;Vestibular  PT Next Visit Plan gait with RW, initiate barriers, begin to address balance.   Consulted and Agree with Plan of Care Patient          Patient will benefit from skilled therapeutic intervention in order to improve the following deficits and impairments:  Abnormal gait, Decreased activity tolerance, Decreased balance, Decreased endurance, Decreased knowledge of precautions, Decreased knowledge of use of DME, Decreased mobility, Decreased range of motion, Decreased skin integrity, Decreased scar mobility, Decreased strength, Dizziness, Increased edema, Postural dysfunction, Prosthetic Dependency, Obesity, Pain  Visit Diagnosis: Abnormal posture  Muscle weakness (generalized)  Unsteadiness on feet  Other abnormalities of gait and mobility     Problem List Patient Active Problem List   Diagnosis  Date Noted  . Allergic rhinitis 03/06/2018  . Unilateral complete BKA, left, sequela ([place])   . Decreased pedal pulses 06/10/2017  . Unilateral primary osteoarthritis, right knee 10/22/2016  . Diabetic neuropathy ([place]) 07/14/2016  . Syncope 02/25/2016  . De Quervain's tenosynovitis, bilateral 11/01/2015  . Vitamin D deficiency 09/05/2015  . Recurrent candidiasis of vagina 09/05/2015  . Varicose veins of leg with complications 61/95/0932  . Restless leg syndrome 10/17/2014  . Chronic sinusitis 07/18/2014  . Headache 07/15/2014  . Urge incontinence 10/15/2013  . Encounter for chronic pain management 06/30/2013  . HLD (hyperlipidemia) 11/19/2012  . Chest pain 06/27/2012  . Right carpal tunnel syndrome 09/01/2011  . Bilateral knee pain 09/01/2011  . Diabetes mellitus ([place]) 05/22/2008  . Morbid obesity ([place]) 05/22/2008  . 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 Depression 05/22/2008  . Obstructive sleep apnea 05/22/2008  . Hypertension 05/22/2008  . Asthma 05/22/2008  . GERD 05/22/2008   Willow Ora, PTA, [facility] [address] [place], [place] [address] [phone] 10/01/18, 11:38 AM   Name: Landen [place] MRN: [id number] Date of Birth: 03/24/1976   PT Short Term Goals - 10/10/18 1011      PT SHORT TERM GOAL #1   Title  Patient verbalizes adjusting ply socks with limb volume changes. (All  updated STGs 15th visit after evaluation)    Status  New      PT SHORT TERM GOAL #2   Title  Patient tolerates wear of prosthesis >8 hrs total / day without skin issues. (All  updated STGs 15th visit after evaluation)    Status  Revised      PT SHORT TERM GOAL #3   Title  Pateint performs standing balance with RW support & prosthesis reaching 5"  & picks up object from floor with supervision. (All  updated STGs 15th visit after evaluation)    Status  Revised      PT SHORT TERM  GOAL #4   Title  Patient ambulates 100' with rolling walker &  prosthesis with supervision. (All  updated STGs 15th visit after evaluation)    Status  Revised      PT SHORT TERM GOAL #5   Title  Patient negotiates 4 steps with 2 rails with minimal guard. (All  updated STGs 15th visit after evaluation)    Status  New      Jamey Reas, PT, DPT PT Specializing in [place] 10/10/18 10:14 AM Phone:  [phone]  Fax:  [phone] [facility] [address] [place], [place] [address]

## 2018-10-02 ENCOUNTER — Ambulatory Visit: Payer: Medicaid Other | Admitting: Podiatry

## 2018-10-03 ENCOUNTER — Ambulatory Visit: Payer: Medicaid Other | Admitting: Physical Therapy

## 2018-10-05 ENCOUNTER — Ambulatory Visit: Payer: Medicaid Other | Admitting: Physical Therapy

## 2018-10-05 ENCOUNTER — Encounter: Payer: Self-pay | Admitting: Physical Therapy

## 2018-10-05 DIAGNOSIS — R2681 Unsteadiness on feet: Secondary | ICD-10-CM

## 2018-10-05 DIAGNOSIS — M6281 Muscle weakness (generalized): Secondary | ICD-10-CM | POA: Diagnosis not present

## 2018-10-05 DIAGNOSIS — R2689 Other abnormalities of gait and mobility: Secondary | ICD-10-CM

## 2018-10-05 DIAGNOSIS — R293 Abnormal posture: Secondary | ICD-10-CM

## 2018-10-05 NOTE — Therapy (Signed)
[employer] [address] [place], [place], [address] Phone: [phone]   Fax:  [phone]  Physical Therapy Treatment  Patient Details  Name: [name] MRN: [id number] Date of Birth: 12-Apr-1976 Referring Provider (PT): Rolena Infante, MD   Encounter Date: 10/05/2018  PT End of Session - 10/05/18 1444    Visit Number  4    Number of Visits  25    Authorization Type  Medicaid    Authorization Time Period  9/16-10/6/19: 3 visits approved; 12 visits approved from 10/10-11/20/19    Authorization - Visit Number  2    Authorization - Number of Visits  12    PT Start Time  7998    PT Stop Time  1400    PT Time Calculation (min)  45 min    Equipment Utilized During Treatment  Gait belt    Activity Tolerance  Patient limited by fatigue;Patient tolerated treatment well;No increased pain;Other (comment)    Behavior During Therapy  WFL for tasks assessed/performed   tearful      Past Medical History:  Diagnosis Date  . Acute osteomyelitis of ankle or foot, left ([place]) 01/31/2018  . Alveolar hypoventilation   . Anemia    not on iron pill  . Asthma   . Bipolar 2 disorder ([place])   . Carpal tunnel syndrome on right    recurrent  . Cellulitis 08/2010-08/2011  . Chronic pain   . COPD (chronic obstructive pulmonary disease) ([name])    Symbicort daily and Proventil as needed  . Costochondritis   . Depression   . Diabetes mellitus 2000   Type 2, Uncontrolled.Takes Lantus daily.Fasting blood sugar runs 150  . Dizziness    occasionally  . Drug-seeking behavior   . GERD (gastroesophageal reflux disease)    takes Pantoprazole and Zantac daily  . Headache    migraine-last one about a yr ago.Topamax daily  . HLD (hyperlipidemia)    takes Atorvastatin daily  . Hypertension    takes Lisinopril and Coreg daily  . Morbid obesity ([place])   . Muscle spasm    takes Flexeril as needed  . Nocturia   . Obstructive sleep apnea   .  Peripheral neuropathy    takes Gabapentin daily  . Pneumonia    "walking" several yrs ago and as a baby  . Rectal fissure   . Restless leg   . Urinary frequency   . Varicose veins    Right medial thigh and Left leg     Past Surgical History:  Procedure Laterality Date  . AMPUTATION Left 02/01/2018   Procedure: LEFT FOURTH AND 5TH TOE RAY AMPUTATION;  Surgeon: [name], MD;  Location: [facility];  Service: Orthopedics;  Laterality: Left;  . AMPUTATION Left 03/03/2018   Procedure: LEFT BELOW KNEE AMPUTATION;  Surgeon: [name], MD;  Location: [facility];  Service: Orthopedics;  Laterality: Left;  . CARPAL TUNNEL RELEASE Bilateral   . CESAREAN SECTION    . KNEE ARTHROSCOPY Right 07/17/2010  . LEFT HEART CATHETERIZATION WITH CORONARY ANGIOGRAM N/A 07/27/2012   Procedure: LEFT HEART CATHETERIZATION WITH CORONARY ANGIOGRAM;  Surgeon: Sherren Mocha, MD;  Location: [facility];  Service: Cardiovascular;  Laterality: N/A;  . left knee surgery     screws she thinks  . MASS EXCISION N/A 06/29/2013   Procedure:  WIDE LOCAL EXCISION OF POSTERIOR NECK ABSCESS;  Surgeon: Ralene Ok, MD;  Location: [facility];  Service: General;  Laterality: N/A;  There were no vitals filed for this visit.  Subjective Assessment - 10/05/18 1315    Subjective  Patient reports wearing prosthesis daily for 5-6 hours 1x/day.     Pertinent History  LTTA, OA, COPD, asthma, DM2, Bipolar    Limitations  Lifting;Standing;Walking;House hold activities    Patient Stated Goals  To use prosthesis to walk in home & community, She would like to be more active with 33yo daughter.     Currently in Pain?  Yes    Pain Score  5     Pain Location  Foot    Pain Orientation  Right    Pain Descriptors / Indicators  Sharp    Pain Type  Acute pain    Pain Onset  40 to 40 weeks ago    Pain Frequency  Constant    Aggravating Factors   wieght bearing, shoes    Pain Relieving Factors  ointment    Effect of Pain on Daily Activities   limits standing & walking                       [facility] Adult PT Treatment/Exercise - 10/05/18 1315      Transfers   Transfers  Sit to Stand;Stand to Sit    Sit to Stand  5: Supervision;With upper extremity assist;With armrests;From chair/3-in-1    Stand to Sit  5: Supervision;With upper extremity assist;With armrests;To chair/3-in-1      Ambulation/Gait   Ambulation/Gait  Yes    Ambulation/Gait Assistance  5: Supervision    Ambulation/Gait Assistance Details  cues on upright posture, step width, step length & wt shift    Ambulation Distance (Feet)  215 Feet   215' X 2   Assistive device  Rolling walker;Prosthesis    Gait Pattern  Step-through pattern;Decreased stride length;Decreased step length - right;Decreased step length - left;Trunk flexed;Narrow base of support;Decreased stance time - left;Decreased weight shift to left    Ambulation Surface  Indoor;Level;Outdoor;Paved    Ramp  5: Supervision   RW & prosthesis   Ramp Details (indicate cue type and reason)  cues on posture & wt shift    Curb  5: Supervision   RW & prosthesis   Curb Details (indicate cue type and reason)  cues on sequence & technique      Neuro Re-ed    Neuro Re-ed Details   standing without UE assist with RW close: 2 minutes with supervision. Reaches 2" safely with supervision.   PT demo using 24" stool to increase back tolerance without back support or UE support in "modified stand position" Pt return demo understanding and able to sit on stool 5 minutes for pt education.       Prosthetics   Prosthetic Care Comments   Sitting with prosthesis supported & not dangling. Increase wear to 5 hrs 2x/day with off 2 hrs between to enable skin to dry.  Use of antiperspirant at night for sweat management.  Can stand to engage pin & lift nonamputated LE to put weight on prosthesis. Do not step away from w/c unless pin has engaged.     Current prosthetic wear tolerance (days/week)   daily    Current prosthetic  wear tolerance (#hours/day)   5-6 hrs/day    Residual limb condition   intact. sweaty.     Education Provided  Residual limb care;Proper wear schedule/adjustment;Proper weight-bearing schedule/adjustment;Other (comment);Proper Donning   see prosthetic care comments   Person(s) Educated  Patient  Education Method  Explanation;Demonstration;Tactile cues;Verbal cues    Education Method  Verbalized understanding;Returned demonstration;Verbal cues required;Needs further instruction    Donning Prosthesis  Supervision    Doffing Prosthesis  Supervision             PT Education - 10/05/18 1355    Education Details  increasing activity level with short walks (room to room in house) increasing frequency, long walk (her max tolerable distance with family supervising / following with w/c) 1-2x/day and medium walk (b/w short & long walk distance, maybe in/out of house) 4-6x/day.     Person(s) Educated  Patient    Methods  Explanation;Verbal cues    Comprehension  Verbalized understanding;Need further instruction       PT Short Term Goals - 10/05/18 1446      PT SHORT TERM GOAL #1   Title  Patient demonstrates proper donning of prosthesis & verbalizes proper cleaning. (All STGs 3rd visit after evaluation)    Baseline  09/29/18: able to don/doff independently, independent with cleaning/care    Status  Achieved      PT SHORT TERM GOAL #2   Title  Patient tolerates wear of prosthesis >5 hrs total / day without skin issues. (All STGs 3rd visit after evaluation)    Baseline  MET 10/05/2018  wearing daily 5-6 hrs/day    Status  Achieved      PT SHORT TERM GOAL #3   Title  Pateint performs standing balance with RW support & prosthesis reaching 2" with supervision. (All STGs 3rd visit after evaluation)    Baseline  MET 10/05/2018    Time  3    Period  Weeks    Status  Achieved      PT SHORT TERM GOAL #4   Title  Patient ambulates 20' with rolling walker & prosthesis with minimal guard. (All  STGs 3rd visit after evaluation)    Baseline  MET 10/05/2018    Time  3    Period  Weeks    Status  Achieved        PT Long Term Goals - 08/09/18 2118      PT LONG TERM GOAL #1   Title  Patient verbalizes & demonstrates proper prosthetic care to enable safe use of prosthesis. (All LTGs Target Date 24 visits after evaluation)    Baseline  Patient is dependent in all prosthetic care.     Time  3    Period  Months    Status  New      PT LONG TERM GOAL #2   Title  Patient tolerates prosthesis wear >90% of awake hours to enable function throughout her day. (All LTGs Target Date 24 visits after evaluation)    Baseline  Patient wore prosthesis 4 of 7 days for 30 minutes.     Time  3    Period  Months    Status  New      PT LONG TERM GOAL #3   Title  Patient performs standing balance with prosthesis & walker or counter support: reaching 10" anteriorly, picking up objects from floor, scanning environment & managing clothes for toileting modified independent.    Baseline  Patient requires minA to reach 2" with RW support & left knee kneeling on mat table to simulate prosthetic support.     Time  3    Period  Months    Status  New      PT LONG TERM GOAL #4   Title  Patient  ambulates 200' with RW & prosthesis modified independent to enable community access.     Baseline  Patient hops 10' with RW with minimal assist. History of 12+ falls with mobility without prosthesis.     Time  3    Period  Months    Status  New      PT LONG TERM GOAL #5   Title  Patient negotiates ramps & curbs with RW and stairs with single rail & cane modified independent to enable community access.     Baseline  Patient is dependent in gait in home & non-ambulatory out of house (w/c bound)    Time  3    Period  Months    Status  New            Plan - 10/05/18 1449    Clinical Impression Statement  Patient met all STGs. She improved prosthetic gait with RW. She seems to understand PT recommendations to  increase activity level. Patient was not tearful today and PT discussion on increasing activity level seemed to help her see some of her potential.     Rehab Potential  Good    PT Frequency  2x / week   1x/wk for 3 weeks & 2x/wk for 10 weeks   PT Duration  Other (comment)   13 weeks total   PT Treatment/Interventions  ADLs/Self Care Home Management;Moist Heat;Ultrasound;DME Instruction;Canalith Repostioning;Gait training;Stair training;Functional mobility training;Therapeutic activities;Therapeutic exercise;Balance training;Neuromuscular re-education;Patient/family education;Prosthetic Training;Manual techniques;Vestibular    PT Next Visit Plan  work towards [place].     Consulted and Agree with Plan of Care  Patient       Patient will benefit from skilled therapeutic intervention in order to improve the following deficits and impairments:  Abnormal gait, Decreased activity tolerance, Decreased balance, Decreased endurance, Decreased knowledge of precautions, Decreased knowledge of use of DME, Decreased mobility, Decreased range of motion, Decreased skin integrity, Decreased scar mobility, Decreased strength, Dizziness, Increased edema, Postural dysfunction, Prosthetic Dependency, Obesity, Pain  Visit Diagnosis: Abnormal posture  Muscle weakness (generalized)  Unsteadiness on feet  Other abnormalities of gait and mobility     Problem List Patient Active Problem List   Diagnosis Date Noted  . Allergic rhinitis 03/06/2018  . Unilateral complete BKA, left, sequela ([place])   . Decreased pedal pulses 06/10/2017  . Unilateral primary osteoarthritis, right knee 10/22/2016  . Diabetic neuropathy ([place]) 07/14/2016  . Syncope 02/25/2016  . De Quervain's tenosynovitis, bilateral 11/01/2015  . Vitamin D deficiency 09/05/2015  . Recurrent candidiasis of vagina 09/05/2015  . Varicose veins of leg with complications 71/69/6789  . Restless leg syndrome 10/17/2014  . Chronic sinusitis 07/18/2014  .  Headache 07/15/2014  . Urge incontinence 10/15/2013  . Encounter for chronic pain management 06/30/2013  . HLD (hyperlipidemia) 11/19/2012  . Chest pain 06/27/2012  . Right carpal tunnel syndrome 09/01/2011  . Bilateral knee pain 09/01/2011  . Diabetes mellitus ([place]) 05/22/2008  . Morbid obesity ([place]) 05/22/2008  . 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 Depression 05/22/2008  . Obstructive sleep apnea 05/22/2008  . Hypertension 05/22/2008  . Asthma 05/22/2008  . GERD 05/22/2008    Hayslee Casebolt PT, DPT 10/05/2018, 3:00 PM  [facility] [address] [place], [place], [address] Phone: [phone]   Fax:  [phone]  Name: Evon Dejarnett MRN: [id number] Date of Birth: 12-19-1975

## 2018-10-06 DIAGNOSIS — J45909 Unspecified asthma, uncomplicated: Secondary | ICD-10-CM | POA: Diagnosis not present

## 2018-10-06 DIAGNOSIS — M86172 Other acute osteomyelitis, left ankle and foot: Secondary | ICD-10-CM | POA: Diagnosis not present

## 2018-10-06 DIAGNOSIS — G4733 Obstructive sleep apnea (adult) (pediatric): Secondary | ICD-10-CM | POA: Diagnosis not present

## 2018-10-06 DIAGNOSIS — E669 Obesity, unspecified: Secondary | ICD-10-CM | POA: Diagnosis not present

## 2018-10-06 DIAGNOSIS — J9620 Acute and chronic respiratory failure, unspecified whether with hypoxia or hypercapnia: Secondary | ICD-10-CM | POA: Diagnosis not present

## 2018-10-09 ENCOUNTER — Encounter: Payer: Self-pay | Admitting: Family Medicine

## 2018-10-09 ENCOUNTER — Ambulatory Visit: Payer: Self-pay

## 2018-10-09 ENCOUNTER — Ambulatory Visit (INDEPENDENT_AMBULATORY_CARE_PROVIDER_SITE_OTHER): Payer: Medicaid Other | Admitting: Podiatry

## 2018-10-09 DIAGNOSIS — M7750 Other enthesopathy of unspecified foot: Secondary | ICD-10-CM

## 2018-10-09 DIAGNOSIS — L600 Ingrowing nail: Secondary | ICD-10-CM | POA: Diagnosis not present

## 2018-10-09 MED ORDER — CIPROFLOXACIN HCL 500 MG PO TABS: 500.0000 mg | ORAL_TABLET | Freq: Two times a day (BID) | ORAL | 0 refills | Status: DC

## 2018-10-09 NOTE — Progress Notes (Deleted)
Nursing: 1. Please call patient to schedule low dose CT. 2. Patient requested diabetic shoes during visit---please ask him to send form for these (typically comes from podiatrist or provider of the shoes).   Let me know if he has questions.

## 2018-10-09 NOTE — Progress Notes (Signed)
cipro

## 2018-10-10 ENCOUNTER — Other Ambulatory Visit: Payer: Self-pay

## 2018-10-10 ENCOUNTER — Ambulatory Visit: Payer: Medicaid Other

## 2018-10-10 MED ORDER — BUDESONIDE-FORMOTEROL FUMARATE 160-4.5 MCG/ACT IN AERO: INHALATION_SPRAY | RESPIRATORY_TRACT | 4 refills | Status: DC

## 2018-10-10 NOTE — Progress Notes (Signed)
   Subjective: Patient presents today 2 weeks post ingrown nail permanent avulsion procedure of the medial border of the right hallux. Patient states that the toe and nail fold is feeling much better. Patient is here for further evaluation and treatment.   Past Medical History:  Diagnosis Date  . Acute osteomyelitis of ankle or foot, left ([place]) 01/31/2018  . Alveolar hypoventilation   . Anemia    not on iron pill  . Asthma   . Bipolar 2 disorder ([place])   . Carpal tunnel syndrome on right    recurrent  . Cellulitis 08/2010-08/2011  . Chronic pain   . COPD (chronic obstructive pulmonary disease) ([name])    Symbicort daily and Proventil as needed  . Costochondritis   . Depression   . Diabetes mellitus 2000   Type 2, Uncontrolled.Takes Lantus daily.Fasting blood sugar runs 150  . Dizziness    occasionally  . Drug-seeking behavior   . GERD (gastroesophageal reflux disease)    takes Pantoprazole and Zantac daily  . Headache    migraine-last one about a yr ago.Topamax daily  . HLD (hyperlipidemia)    takes Atorvastatin daily  . Hypertension    takes Lisinopril and Coreg daily  . Morbid obesity ([place])   . Muscle spasm    takes Flexeril as needed  . Nocturia   . Obstructive sleep apnea   . Peripheral neuropathy    takes Gabapentin daily  . Pneumonia    "walking" several yrs ago and as a baby  . Rectal fissure   . Restless leg   . Urinary frequency   . Varicose veins    Right medial thigh and Left leg     Objective: Skin is warm, dry and supple. Nail and respective nail fold appears to be healing appropriately. Open wound to the associated nail fold with a granular wound base and moderate amount of fibrotic tissue. Minimal drainage noted. Mild erythema around the periungual region likely due to phenol chemical matricectomy.  Assessment: #1 postop permanent partial nail avulsion medial border right hallux  #2 open wound periungual nail fold of respective digit.  #3 h/o left  BKA  Plan of care: #1 patient was evaluated  #2 debridement of open wound was performed to the periungual border of the respective toe using a currette. Antibiotic ointment and Band-Aid was applied. #3 Continue using Gentamicin cream daily with a bandage.  #4 Prescription for Cipro 500 mg BID based on C & S.  #5 Patient is to return to clinic in 2 weeks.   Edrick Kins, DPM [facility]  Dr. Edrick Kins, [place]                                        [place], [place] [address]                Office [phone]  Fax [phone]

## 2018-10-12 ENCOUNTER — Ambulatory Visit: Payer: Medicaid Other | Attending: Physical Medicine & Rehabilitation | Admitting: Physical Therapy

## 2018-10-12 DIAGNOSIS — M6281 Muscle weakness (generalized): Secondary | ICD-10-CM | POA: Diagnosis not present

## 2018-10-12 DIAGNOSIS — R293 Abnormal posture: Secondary | ICD-10-CM | POA: Diagnosis not present

## 2018-10-12 DIAGNOSIS — R2689 Other abnormalities of gait and mobility: Secondary | ICD-10-CM | POA: Insufficient documentation

## 2018-10-12 NOTE — Therapy (Signed)
[facility] [address] [place], [place], [address] Phone: [phone]   Fax:  [phone]  Physical Therapy Treatment  Patient Details  Name: [name] MRN: [id number] Date of Birth: 10/01/1976 Referring Provider (PT): Rolena Infante, MD   Encounter Date: 10/12/2018  PT End of Session - 10/12/18 1508    Visit Number  5    Number of Visits  25    Authorization Type  Medicaid    Authorization Time Period  9/16-10/6/19: 3 visits approved; 12 visits approved from 10/10-11/20/19    Authorization - Visit Number  3    Authorization - Number of Visits  12    PT Start Time  2426    PT Stop Time  1447    PT Time Calculation (min)  42 min    Equipment Utilized During Treatment  Gait belt    Activity Tolerance  Patient limited by fatigue;Patient tolerated treatment well;No increased pain    Behavior During Therapy  WFL for tasks assessed/performed       Past Medical History:  Diagnosis Date  . Acute osteomyelitis of ankle or foot, left ([facility]) 01/31/2018  . Alveolar hypoventilation   . Anemia    not on iron pill  . Asthma   . Bipolar 2 disorder ([place])   . Carpal tunnel syndrome on right    recurrent  . Cellulitis 08/2010-08/2011  . Chronic pain   . COPD (chronic obstructive pulmonary disease) ([name])    Symbicort daily and Proventil as needed  . Costochondritis   . Depression   . Diabetes mellitus 2000   Type 2, Uncontrolled.Takes Lantus daily.Fasting blood sugar runs 150  . Dizziness    occasionally  . Drug-seeking behavior   . GERD (gastroesophageal reflux disease)    takes Pantoprazole and Zantac daily  . Headache    migraine-last one about a yr ago.Topamax daily  . HLD (hyperlipidemia)    takes Atorvastatin daily  . Hypertension    takes Lisinopril and Coreg daily  . Morbid obesity ([place])   . Muscle spasm    takes Flexeril as needed  . Nocturia   . Obstructive sleep apnea   . Peripheral neuropathy    takes  Gabapentin daily  . Pneumonia    "walking" several yrs ago and as a baby  . Rectal fissure   . Restless leg   . Urinary frequency   . Varicose veins    Right medial thigh and Left leg     Past Surgical History:  Procedure Laterality Date  . AMPUTATION Left 02/01/2018   Procedure: LEFT FOURTH AND 5TH TOE RAY AMPUTATION;  Surgeon: [name], MD;  Location: [facility];  Service: Orthopedics;  Laterality: Left;  . AMPUTATION Left 03/03/2018   Procedure: LEFT BELOW KNEE AMPUTATION;  Surgeon: [name], MD;  Location: [facility];  Service: Orthopedics;  Laterality: Left;  . CARPAL TUNNEL RELEASE Bilateral   . CESAREAN SECTION    . KNEE ARTHROSCOPY Right 07/17/2010  . LEFT HEART CATHETERIZATION WITH CORONARY ANGIOGRAM N/A 07/27/2012   Procedure: LEFT HEART CATHETERIZATION WITH CORONARY ANGIOGRAM;  Surgeon: Sherren Mocha, MD;  Location: [facility] CATH LAB;  Service: Cardiovascular;  Laterality: N/A;  . left knee surgery     screws she thinks  . MASS EXCISION N/A 06/29/2013   Procedure:  WIDE LOCAL EXCISION OF POSTERIOR NECK ABSCESS;  Surgeon: Ralene Ok, MD;  Location: [facility];  Service: General;  Laterality: N/A;    There were no  vitals filed for this visit.  Subjective Assessment - 10/12/18 1409    Subjective  Patient is walking around the house and wearing the prosthesis 5-6 hrs a day. She has not had any falls. She is only getting a few clicks and is not wearing any socks.    Pertinent History  LTTA, OA, COPD, asthma, DM2, Bipolar    Limitations  Lifting;Standing;Walking;House hold activities    Patient Stated Goals  To use prosthesis to walk in home & community, She would like to be more active with 48yo daughter.     Currently in Pain?  Yes    Pain Score  5     Pain Location  Toe and residual limb   Pain Descriptors / Indicators  Sore    Aggravating Factors   weight bearing        [facility] Adult PT Treatment/Exercise - 10/12/18 1454      Transfers   Transfers  Sit to Stand;Stand to Sit     Sit to Stand  5: Supervision;With upper extremity assist;With armrests;From chair/3-in-1    Stand to Sit  5: Supervision;With upper extremity assist;With armrests;To chair/3-in-1   from chair with armrests and from [facility]    Comments  stabilized wheelchair with block due to broken brake. needs UE support for stabilty with transfers.       Ambulation/Gait   Ambulation/Gait  Yes    Ambulation/Gait Assistance  5: Supervision    Ambulation/Gait Assistance Details  cues for posture and to remain closer within walker    Ambulation Distance (Feet)  115 Feet   115x1; 500 ft on sidewalk with 5x seated restbreak in [facility]   Assistive device  Rolling walker;Prosthesis    Gait Pattern  Step-through pattern;Decreased stride length;Decreased step length - right;Decreased step length - left;Trunk flexed;Narrow base of support;Decreased stance time - left;Decreased weight shift to left    Ambulation Surface  Level;Indoor;Unlevel;Outdoor;Paved    Ramp  --    Curb  --    Gait Comments  Patient presented with slight external rotation due to possible hip instability; patient was min guard during outdoor ambulation due to diffculty with RW over cracks in sidewalk; Able to increase gait distance with seated rest breaks.      Prosthetics   Prosthetic Care Comments   Education regarding if the leg clicks several times durring donning then the prosthesis is safely donned.    Current prosthetic wear tolerance (days/week)   daily    Current prosthetic wear tolerance (#hours/day)   5-6 hrs/day    Residual limb condition   intact. sweaty, sore per pt    Education Provided  Residual limb care;Proper wear schedule/adjustment;Proper weight-bearing schedule/adjustment;Other (comment);Proper Donning   see prosthetic care comments   Person(s) Educated  Patient    Education Method  Explanation    Education Method  Verbalized understanding           PT Education - 10/12/18 1505    Education Details  Education to remain  active at home. Due to patient expressing symtpoms of depression, recommended discussing with her PCP for treatment options. Patient was also encouraged to continue coming to physical therapy after expressing feelings of not wanting to come to PT.    Person(s) Educated  Patient    Methods  Explanation    Comprehension  Verbalized understanding       PT Short Term Goals - 10/10/18 1011      PT SHORT TERM GOAL #1   Title  Patient  verbalizes adjusting ply socks with limb volume changes. (All  updated STGs 15th visit after evaluation)    Status  New      PT SHORT TERM GOAL #2   Title  Patient tolerates wear of prosthesis >8 hrs total / day without skin issues. (All  updated STGs 15th visit after evaluation)    Status  Revised      PT SHORT TERM GOAL #3   Title  Pateint performs standing balance with RW support & prosthesis reaching 5"  & picks up object from floor with supervision. (All  updated STGs 15th visit after evaluation)    Status  Revised      PT SHORT TERM GOAL #4   Title  Patient ambulates 100' with rolling walker & prosthesis with supervision. (All  updated STGs 15th visit after evaluation)    Status  Revised      PT SHORT TERM GOAL #5   Title  Patient negotiates 4 steps with 2 rails with minimal guard. (All  updated STGs 15th visit after evaluation)    Status  New        PT Long Term Goals - 08/09/18 2118      PT LONG TERM GOAL #1   Title  Patient verbalizes & demonstrates proper prosthetic care to enable safe use of prosthesis. (All LTGs Target Date 24 visits after evaluation)    Baseline  Patient is dependent in all prosthetic care.     Time  3    Period  Months    Status  New      PT LONG TERM GOAL #2   Title  Patient tolerates prosthesis wear >90% of awake hours to enable function throughout her day. (All LTGs Target Date 24 visits after evaluation)    Baseline  Patient wore prosthesis 4 of 7 days for 30 minutes.     Time  3    Period  Months    Status  New       PT LONG TERM GOAL #3   Title  Patient performs standing balance with prosthesis & walker or counter support: reaching 10" anteriorly, picking up objects from floor, scanning environment & managing clothes for toileting modified independent.    Baseline  Patient requires minA to reach 2" with RW support & left knee kneeling on mat table to simulate prosthetic support.     Time  3    Period  Months    Status  New      PT LONG TERM GOAL #4   Title  Patient ambulates 200' with RW & prosthesis modified independent to enable community access.     Baseline  Patient hops 10' with RW with minimal assist. History of 12+ falls with mobility without prosthesis.     Time  3    Period  Months    Status  New      PT LONG TERM GOAL #5   Title  Patient negotiates ramps & curbs with RW and stairs with single rail & cane modified independent to enable community access.     Baseline  Patient is dependent in gait in home & non-ambulatory out of house (w/c bound)    Time  3    Period  Months    Status  New            Plan - 10/12/18 1509    Clinical Impression Statement  Treatment focused on gait and increasing activity tolerance. She was able to ambulate a  total of 615 feet with serveral rest breaks(5). She was encouraged to continue being active. Patient became tearful multiple times throughout treatment and expressed feelings of depression. Patient was recommended to discuss these feelings with her PCP which she will see 10/16/18. Patient would benefit from further physical therapy to increase activity tolerance and independence.    Clinical Presentation  Evolving    Rehab Potential  Good    PT Frequency  2x / week    PT Treatment/Interventions  ADLs/Self Care Home Management;Moist Heat;Ultrasound;DME Instruction;Canalith Repostioning;Gait training;Stair training;Functional mobility training;Therapeutic activities;Therapeutic exercise;Balance training;Neuromuscular re-education;Patient/family  education;Prosthetic Training;Manual techniques;Vestibular    PT Next Visit Plan  Continue working towards LTGs, stairs, ramps, curb,    Consulted and Agree with Plan of Care  Patient       Patient will benefit from skilled therapeutic intervention in order to improve the following deficits and impairments:  Abnormal gait, Decreased activity tolerance, Decreased balance, Decreased endurance, Decreased knowledge of precautions, Decreased knowledge of use of DME, Decreased mobility, Decreased range of motion, Decreased skin integrity, Decreased scar mobility, Decreased strength, Dizziness, Increased edema, Postural dysfunction, Prosthetic Dependency, Obesity, Pain  Visit Diagnosis: Other abnormalities of gait and mobility  Muscle weakness (generalized)  Abnormal posture     Problem List Patient Active Problem List   Diagnosis Date Noted  . Allergic rhinitis 03/06/2018  . Unilateral complete BKA, left, sequela ([place])   . Decreased pedal pulses 06/10/2017  . Unilateral primary osteoarthritis, right knee 10/22/2016  . Diabetic neuropathy ([place]) 07/14/2016  . Syncope 02/25/2016  . De Quervain's tenosynovitis, bilateral 11/01/2015  . Vitamin D deficiency 09/05/2015  . Recurrent candidiasis of vagina 09/05/2015  . Varicose veins of leg with complications 58/52/7782  . Restless leg syndrome 10/17/2014  . Chronic sinusitis 07/18/2014  . Headache 07/15/2014  . Urge incontinence 10/15/2013  . Encounter for chronic pain management 06/30/2013  . HLD (hyperlipidemia) 11/19/2012  . Chest pain 06/27/2012  . Right carpal tunnel syndrome 09/01/2011  . Bilateral knee pain 09/01/2011  . Diabetes mellitus ([place]) 05/22/2008  . Morbid obesity ([place]) 05/22/2008  . 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 Depression 05/22/2008  . Obstructive sleep apnea 05/22/2008  . Hypertension 05/22/2008  . Asthma 05/22/2008  . GERD 05/22/2008    Jayelyn Barno SPTA 10/12/2018, 3:14 PM  [facility] [address] [place], [place], [address] Phone: [phone]   Fax:  [phone]  Name: [name] MRN: [id number] Date of Birth: 06/17/1976

## 2018-10-13 ENCOUNTER — Telehealth: Payer: Self-pay

## 2018-10-13 IMAGING — CR DG CHEST 2V
2 series · 2 of 2 positions shown · non-contrast
Comparison: 10/30/2016

CLINICAL DATA: Left-sided chest pain.

EXAM:
CHEST  2 VIEW

[chest lat]
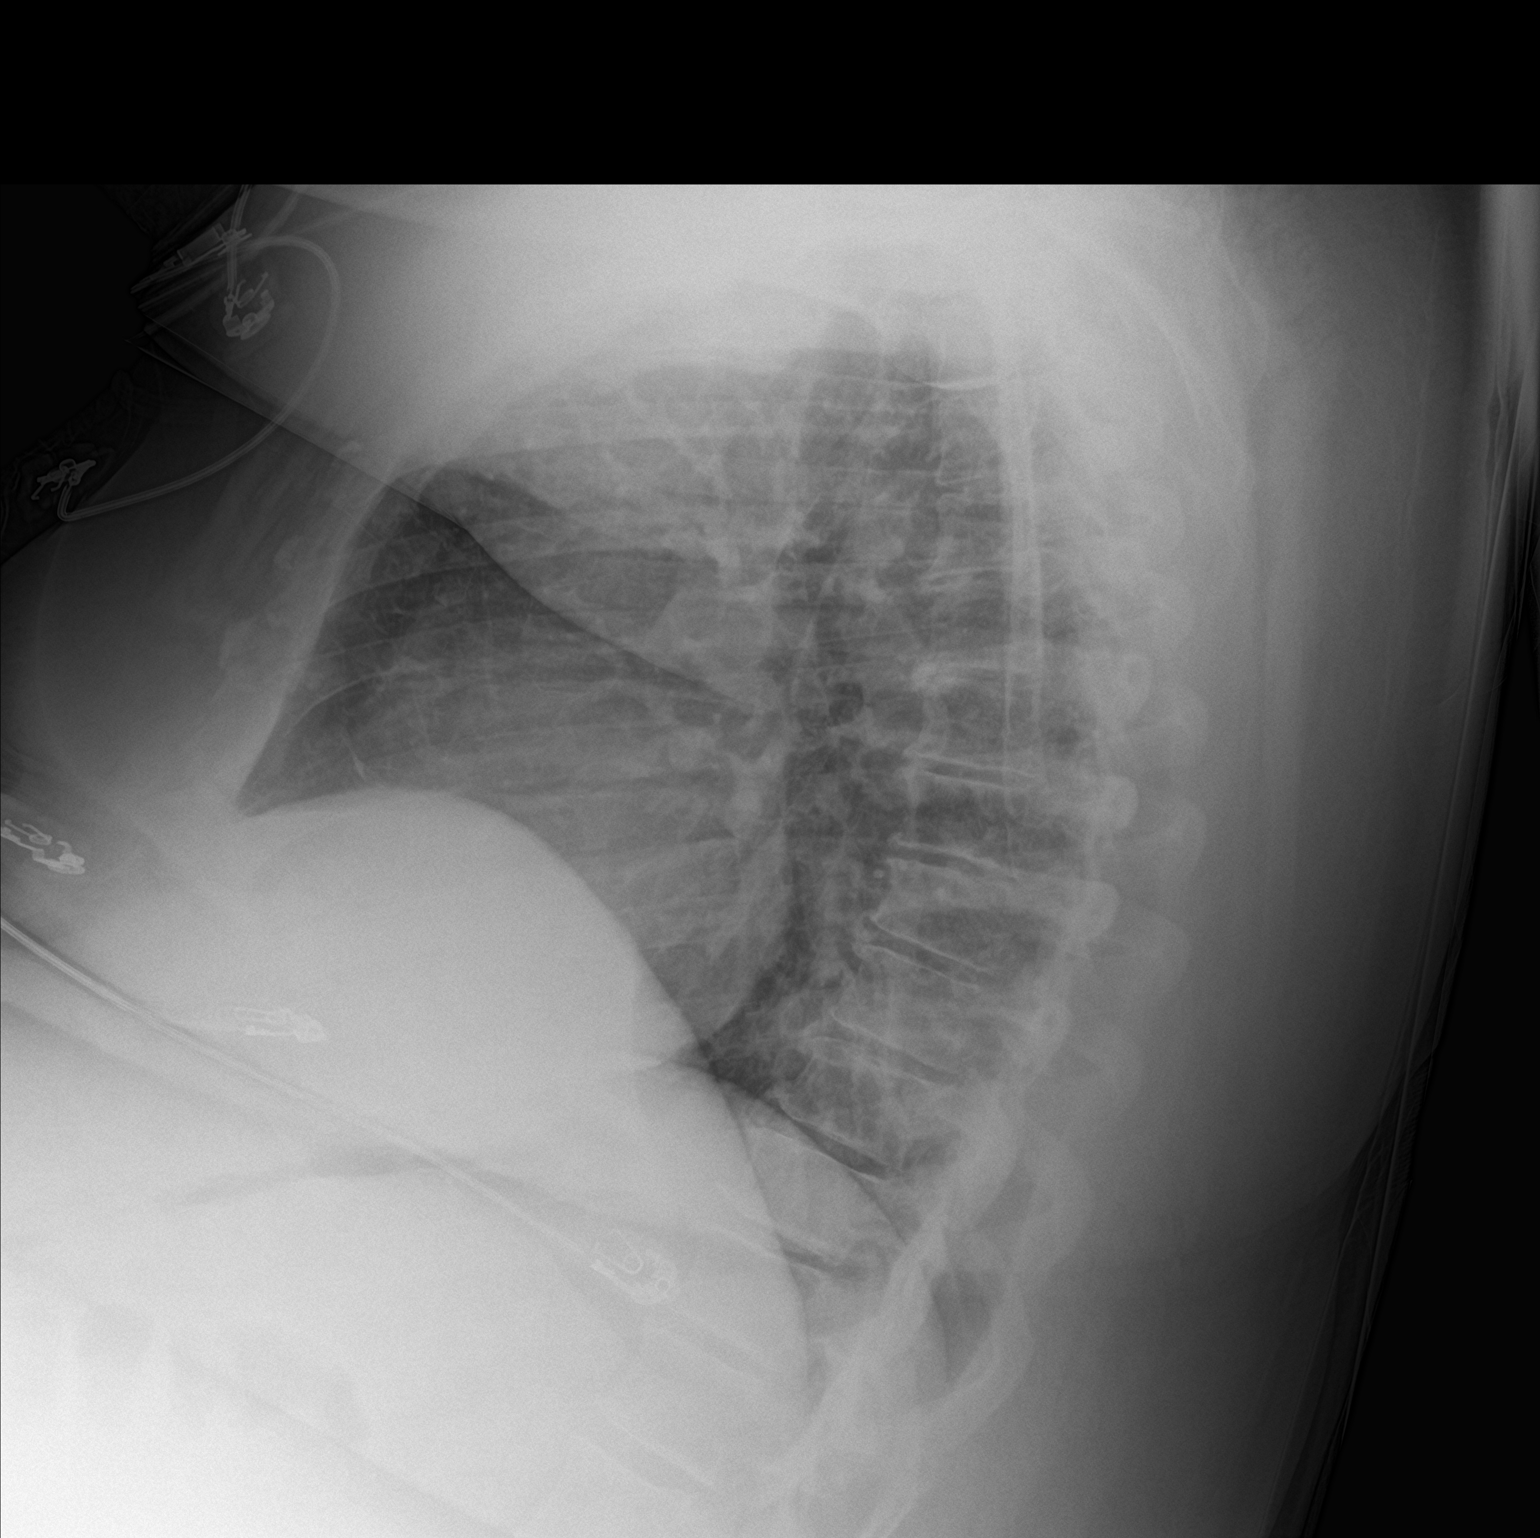

[chest ap]
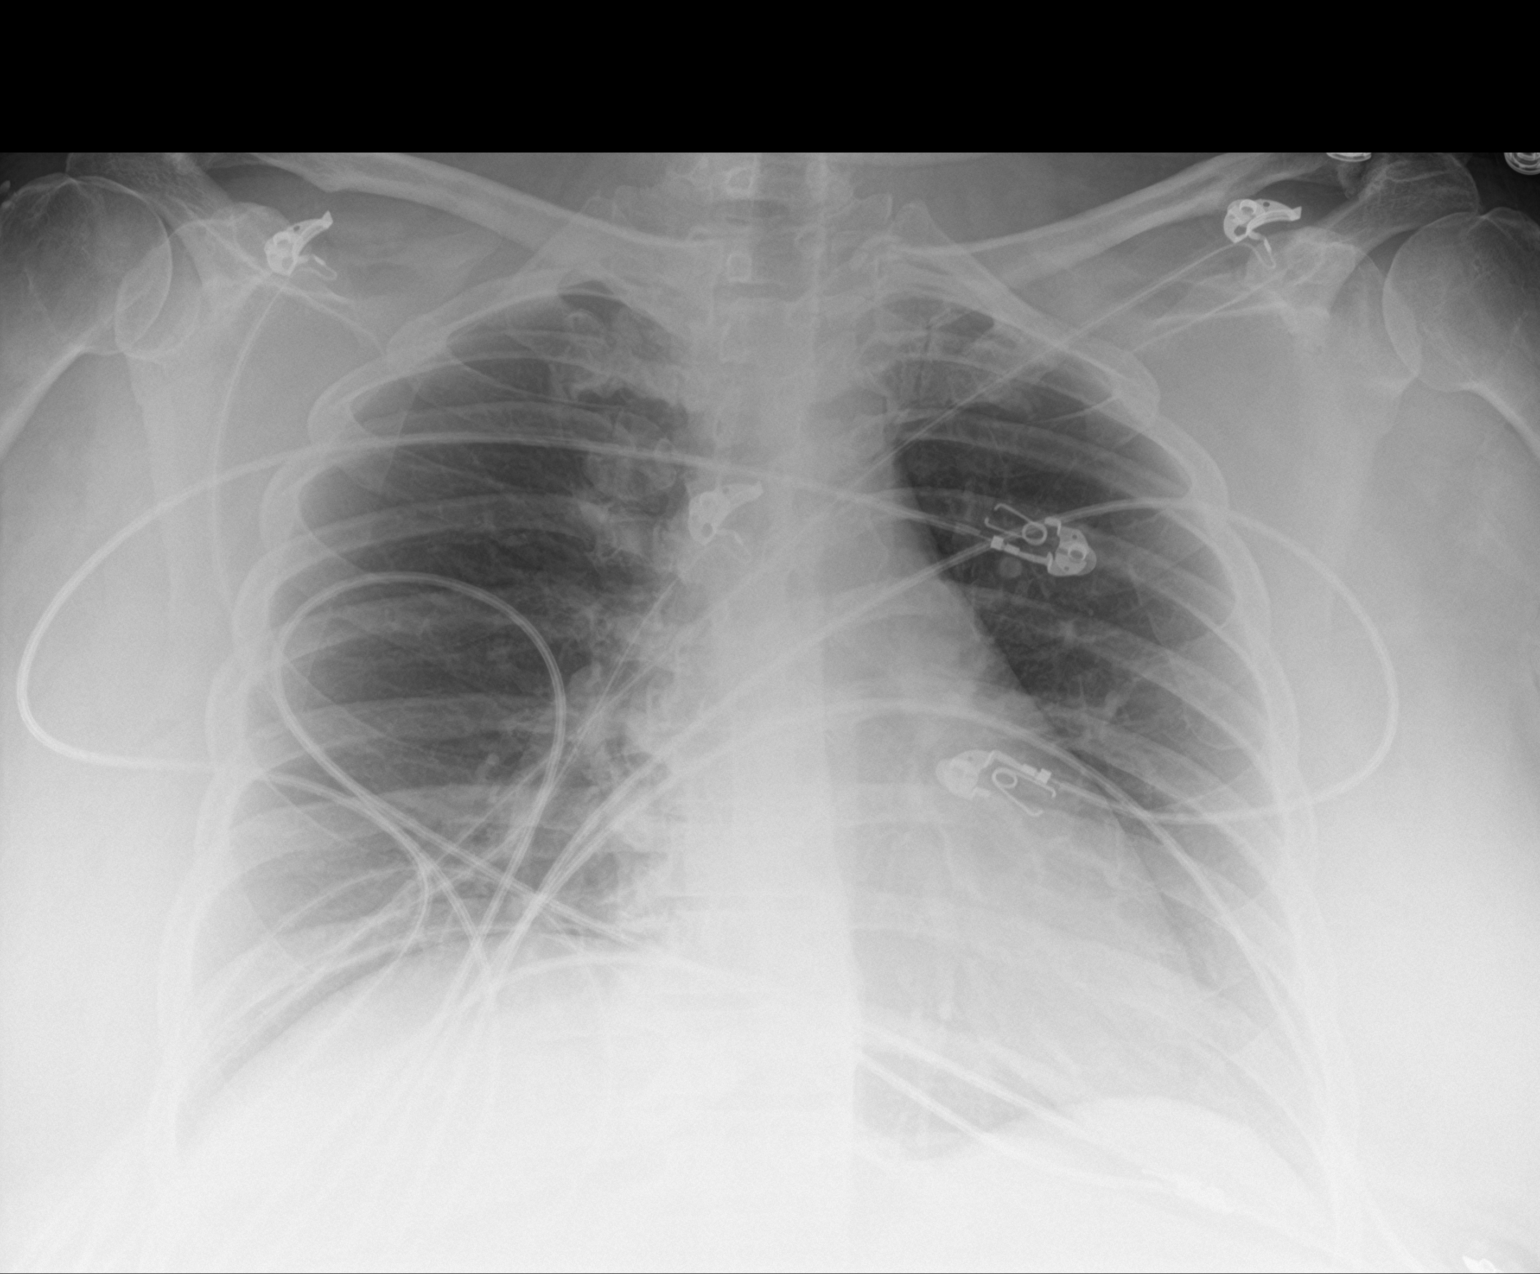

[2 of 2 positions shown; findings below may reference images not displayed]

FINDINGS: The cardiomediastinal contours are unchanged, heart upper normal in
size. The lungs are clear. Pulmonary vasculature is normal. No
consolidation, pleural effusion, or pneumothorax. No acute osseous
abnormalities are seen.
IMPRESSION: No acute abnormality.  Upper normal heart size.

## 2018-10-13 NOTE — Telephone Encounter (Signed)
Patient left message that her Hydrocodone needs PA. Clinical info submitted via [employer]. Status pending. Will recheck status next business day.  Danley Danker, RN [facility] RN)

## 2018-10-16 ENCOUNTER — Encounter: Payer: Self-pay | Admitting: Family Medicine

## 2018-10-16 ENCOUNTER — Other Ambulatory Visit: Payer: Self-pay

## 2018-10-16 ENCOUNTER — Ambulatory Visit (INDEPENDENT_AMBULATORY_CARE_PROVIDER_SITE_OTHER): Payer: Medicaid Other | Admitting: Family Medicine

## 2018-10-16 VITALS — BP 138/82 | HR 83 | Temp 98.7°F | Ht 66.0 in

## 2018-10-16 DIAGNOSIS — F329 Major depressive disorder, single episode, unspecified: Secondary | ICD-10-CM | POA: Diagnosis not present

## 2018-10-16 DIAGNOSIS — F32A Depression, unspecified: Secondary | ICD-10-CM

## 2018-10-16 DIAGNOSIS — E785 Hyperlipidemia, unspecified: Secondary | ICD-10-CM

## 2018-10-16 DIAGNOSIS — G8929 Other chronic pain: Secondary | ICD-10-CM | POA: Diagnosis not present

## 2018-10-16 DIAGNOSIS — G894 Chronic pain syndrome: Secondary | ICD-10-CM

## 2018-10-16 MED ORDER — HYDROCODONE-ACETAMINOPHEN 10-325 MG PO TABS: 1.0000 | ORAL_TABLET | Freq: Three times a day (TID) | ORAL | 0 refills | Status: AC | PRN

## 2018-10-16 NOTE — Patient Instructions (Addendum)
On your way out, schedule an appointment one morning to come back for fasting labs. Do not eat or drink anything other than water the morning of your lab appointment until after your labs are drawn.  Will have Neoma Laming call you.  We will try again for behavioral health  Sent in refill on your pain medication.  Call toe doctor about your toe  Follow up with me separately to talk about diabetes  Be well, Dr. [name]

## 2018-10-16 NOTE — Telephone Encounter (Signed)
Prior approval for Hydrocodone completed via [employer].  Med approved for 10/13/18 - 04/11/19.  Prior approval # [id number].  [facility] informed.  Esau Grew, RN

## 2018-10-16 NOTE — Progress Notes (Signed)
Date of Visit: 10/16/2018   HPI:  Patient presents for routine follow up.  Chronic pain - taking norco 10-325mg  1 tablet three times daily. Tolerating this well. No unwanted side effects. Due for refill. Has been doing physical therapy to work on using her new prosthesis.  Toe issue - had ingrown toenail recently removed. Is on antibiotic (cipro) for infection but thinks the toe is getting more red. Has not called her podiatrist about this. Has upcoming follow up appointment with the podiatrist.  Stressors - husband recently left her. Very stressed and sad about this but thinks she is overall coping okay. Wants to meet with a counselor but not today. Would be open to having [facility] call her. Also wants to get in with behavioral health outpatient.  ROS: See HPI.  [place]: history of morbid obesity, hyperlipidemia, depression, chronic pain, diabetes, hypertension, asthma, GERD, left BKA  PHYSICAL EXAM: BP 138/82   Pulse 83   Temp 98.7 F (37.1 C) (Oral)   Ht 5\' 6"  (1.676 m)   SpO2 98%   BMI 49.82 kg/m  Gen: no acute distress, pleasant, cooperative HEENT: normocephalic, atraumatic  Lungs: normal work of breathing  Neuro: alert grossly nonfocal, speech normal Ext: R great toe with erythema extending midway down toe, no purulence     ASSESSMENT/PLAN:  Health maintenance:  -current on HM items  Encounter for chronic pain management [place] PMP reviewed with appropriate findings Refilled for 1 more month (had recent rx from Dr. Owens Shark). Continue working with physical therapy for using prosthesis  Depression Coping decently well given all that has gone on in her life. No SI/HI. Will message [facility] pool about having someone call her. Also will try again to get her an appointment at behavioral health  HLD (hyperlipidemia) Return for fasting lipids  Toe redness Encouraged her to contact podiatrist's office regarding this issue since they are actively following it  FOLLOW UP: Follow up  in several weeks  for diabetes   [name], [place]

## 2018-10-17 ENCOUNTER — Encounter: Payer: Self-pay | Admitting: Physical Therapy

## 2018-10-18 ENCOUNTER — Ambulatory Visit: Payer: Medicaid Other | Admitting: Physical Therapy

## 2018-10-19 ENCOUNTER — Ambulatory Visit: Payer: Medicaid Other | Admitting: Physical Therapy

## 2018-10-19 ENCOUNTER — Encounter: Payer: Self-pay | Admitting: Physical Therapy

## 2018-10-20 NOTE — Assessment & Plan Note (Signed)
Return for fasting lipids 

## 2018-10-20 NOTE — Assessment & Plan Note (Signed)
Coping decently well given all that has gone on in her life. No SI/HI. Will message [facility] pool about having someone call her. Also will try again to get her an appointment at behavioral health

## 2018-10-20 NOTE — Assessment & Plan Note (Addendum)
[place] PMP reviewed with appropriate findings Refilled for 1 more month (had recent rx from Dr. Owens Shark). Continue working with physical therapy for using prosthesis

## 2018-10-23 ENCOUNTER — Telehealth: Payer: Self-pay

## 2018-10-23 ENCOUNTER — Other Ambulatory Visit: Payer: Self-pay

## 2018-10-23 ENCOUNTER — Encounter: Payer: Medicaid Other | Admitting: Podiatry

## 2018-10-23 DIAGNOSIS — N760 Acute vaginitis: Secondary | ICD-10-CM | POA: Insufficient documentation

## 2018-10-23 DIAGNOSIS — F39 Unspecified mood [affective] disorder: Secondary | ICD-10-CM

## 2018-10-23 NOTE — Addendum Note (Signed)
Addended by: [name] on: 10/23/2018 01:37 PM   Modules accepted: Orders

## 2018-10-23 NOTE — Telephone Encounter (Signed)
Referral entered, thanks!  J , MD  

## 2018-10-23 NOTE — Telephone Encounter (Signed)
Called and spoke to [facility] and a referral needs to be placed for patient before an appointment can be scheduled since patient has never been seen there before.  There are no appointments available until the end of January or the first part of February.  Let me know when referral is placed and I will call and schedule.  Thanks  .[name], CMA

## 2018-10-23 NOTE — Telephone Encounter (Signed)
Spoke to patient concerning her preference of what date and time she preferred for appointment with  [facility].    Patient informed me that January or February is too long and that she does not want to go to Outpatient.   Patient states that she was told by Dr. [name] that she could see Casimer Lanius.  Relayed all this to Dr. [name].  [name], [place]

## 2018-10-24 ENCOUNTER — Ambulatory Visit: Payer: Medicaid Other

## 2018-10-24 NOTE — Progress Notes (Signed)
[facility] consult from Dr. [name] to contact patient to schedule [facility] appointment.    Called patient, left message to call front office to schedule appointment with me.  Update provided to PCP.  [name], [place]   [phone] 12:41 PM

## 2018-10-26 ENCOUNTER — Ambulatory Visit: Payer: Medicaid Other | Admitting: Physical Therapy

## 2018-10-28 ENCOUNTER — Encounter: Payer: Self-pay | Admitting: Podiatry

## 2018-10-29 ENCOUNTER — Emergency Department (HOSPITAL_COMMUNITY): Payer: Medicaid Other

## 2018-10-29 ENCOUNTER — Inpatient Hospital Stay (HOSPITAL_COMMUNITY)
Admission: EM | Admit: 2018-10-29 | Discharge: 2018-10-29 | DRG: 603 | Disposition: A | Discharge status: Home / Self Care | Payer: Medicaid Other | Attending: Family Medicine | Admitting: Family Medicine

## 2018-10-29 ENCOUNTER — Inpatient Hospital Stay (HOSPITAL_COMMUNITY): Payer: Medicaid Other

## 2018-10-29 ENCOUNTER — Encounter (HOSPITAL_COMMUNITY): Payer: Self-pay | Admitting: *Deleted

## 2018-10-29 DIAGNOSIS — E781 Pure hyperglyceridemia: Secondary | ICD-10-CM | POA: Diagnosis present

## 2018-10-29 DIAGNOSIS — E662 Morbid (severe) obesity with alveolar hypoventilation: Secondary | ICD-10-CM | POA: Diagnosis present

## 2018-10-29 DIAGNOSIS — I10 Essential (primary) hypertension: Secondary | ICD-10-CM | POA: Diagnosis present

## 2018-10-29 DIAGNOSIS — E1165 Type 2 diabetes mellitus with hyperglycemia: Secondary | ICD-10-CM | POA: Diagnosis not present

## 2018-10-29 DIAGNOSIS — G8929 Other chronic pain: Secondary | ICD-10-CM | POA: Diagnosis present

## 2018-10-29 DIAGNOSIS — M7989 Other specified soft tissue disorders: Secondary | ICD-10-CM | POA: Diagnosis not present

## 2018-10-29 DIAGNOSIS — L03115 Cellulitis of right lower limb: Secondary | ICD-10-CM | POA: Diagnosis not present

## 2018-10-29 DIAGNOSIS — D649 Anemia, unspecified: Secondary | ICD-10-CM | POA: Diagnosis present

## 2018-10-29 DIAGNOSIS — L539 Erythematous condition, unspecified: Secondary | ICD-10-CM | POA: Diagnosis not present

## 2018-10-29 DIAGNOSIS — Z89512 Acquired absence of left leg below knee: Secondary | ICD-10-CM

## 2018-10-29 DIAGNOSIS — Z8349 Family history of other endocrine, nutritional and metabolic diseases: Secondary | ICD-10-CM

## 2018-10-29 DIAGNOSIS — Z5329 Procedure and treatment not carried out because of patient's decision for other reasons: Secondary | ICD-10-CM | POA: Diagnosis present

## 2018-10-29 DIAGNOSIS — Z79899 Other long term (current) drug therapy: Secondary | ICD-10-CM

## 2018-10-29 DIAGNOSIS — E1169 Type 2 diabetes mellitus with other specified complication: Secondary | ICD-10-CM | POA: Diagnosis present

## 2018-10-29 DIAGNOSIS — L089 Local infection of the skin and subcutaneous tissue, unspecified: Secondary | ICD-10-CM

## 2018-10-29 DIAGNOSIS — K219 Gastro-esophageal reflux disease without esophagitis: Secondary | ICD-10-CM | POA: Diagnosis present

## 2018-10-29 DIAGNOSIS — Z833 Family history of diabetes mellitus: Secondary | ICD-10-CM

## 2018-10-29 DIAGNOSIS — F3181 Bipolar II disorder: Secondary | ICD-10-CM | POA: Diagnosis present

## 2018-10-29 DIAGNOSIS — Z87891 Personal history of nicotine dependence: Secondary | ICD-10-CM | POA: Diagnosis not present

## 2018-10-29 DIAGNOSIS — E559 Vitamin D deficiency, unspecified: Secondary | ICD-10-CM | POA: Diagnosis present

## 2018-10-29 DIAGNOSIS — J449 Chronic obstructive pulmonary disease, unspecified: Secondary | ICD-10-CM | POA: Diagnosis present

## 2018-10-29 DIAGNOSIS — M1812 Unilateral primary osteoarthritis of first carpometacarpal joint, left hand: Secondary | ICD-10-CM | POA: Diagnosis present

## 2018-10-29 DIAGNOSIS — G2581 Restless legs syndrome: Secondary | ICD-10-CM | POA: Diagnosis present

## 2018-10-29 DIAGNOSIS — Z794 Long term (current) use of insulin: Secondary | ICD-10-CM | POA: Diagnosis not present

## 2018-10-29 DIAGNOSIS — M1711 Unilateral primary osteoarthritis, right knee: Secondary | ICD-10-CM | POA: Diagnosis present

## 2018-10-29 DIAGNOSIS — Z8249 Family history of ischemic heart disease and other diseases of the circulatory system: Secondary | ICD-10-CM

## 2018-10-29 DIAGNOSIS — E1140 Type 2 diabetes mellitus with diabetic neuropathy, unspecified: Secondary | ICD-10-CM | POA: Diagnosis present

## 2018-10-29 DIAGNOSIS — Z6841 Body Mass Index (BMI) 40.0 and over, adult: Secondary | ICD-10-CM

## 2018-10-29 DIAGNOSIS — Z818 Family history of other mental and behavioral disorders: Secondary | ICD-10-CM

## 2018-10-29 DIAGNOSIS — Z888 Allergy status to other drugs, medicaments and biological substances status: Secondary | ICD-10-CM

## 2018-10-29 DIAGNOSIS — L03031 Cellulitis of right toe: Principal | ICD-10-CM | POA: Diagnosis present

## 2018-10-29 DIAGNOSIS — R52 Pain, unspecified: Secondary | ICD-10-CM | POA: Diagnosis not present

## 2018-10-29 DIAGNOSIS — L600 Ingrowing nail: Secondary | ICD-10-CM | POA: Diagnosis present

## 2018-10-29 DIAGNOSIS — E785 Hyperlipidemia, unspecified: Secondary | ICD-10-CM | POA: Diagnosis present

## 2018-10-29 DIAGNOSIS — M869 Osteomyelitis, unspecified: Secondary | ICD-10-CM | POA: Diagnosis present

## 2018-10-29 DIAGNOSIS — R739 Hyperglycemia, unspecified: Secondary | ICD-10-CM

## 2018-10-29 DIAGNOSIS — M79674 Pain in right toe(s): Secondary | ICD-10-CM | POA: Diagnosis not present

## 2018-10-29 LAB — BASIC METABOLIC PANEL
Anion gap: 9 (ref 5–15)
BUN: 11 mg/dL (ref 6–20)
CALCIUM: 8.8 mg/dL — AB (ref 8.9–10.3)
CHLORIDE: 99 mmol/L (ref 98–111)
CO2: 23 mmol/L (ref 22–32)
CREATININE: 0.87 mg/dL (ref 0.44–1.00)
GFR calc Af Amer: >60 mL/min (ref >60)
GFR calc non Af Amer: >60 mL/min (ref >60)
GLUCOSE: 413 mg/dL — AB (ref 70–99)
Potassium: 3.8 mmol/L (ref 3.5–5.1)
Sodium: 131 mmol/L — ABNORMAL LOW (ref 135–145)

## 2018-10-29 LAB — CBC WITH DIFFERENTIAL/PLATELET
ABS IMMATURE GRANULOCYTES: 0.05 10*3/uL (ref 0.00–0.07)
Basophils Absolute: 0 10*3/uL (ref 0.0–0.1)
Basophils Relative: 0 %
EOS ABS: 0.2 10*3/uL (ref 0.0–0.5)
Eosinophils Relative: 2 %
HCT: 37.3 % (ref 36.0–46.0)
Hemoglobin: 12 g/dL (ref 12.0–15.0)
Immature Granulocytes: 1 %
Lymphocytes Relative: 33 %
Lymphs Abs: 3 10*3/uL (ref 0.7–4.0)
MCH: 28.8 pg (ref 26.0–34.0)
MCHC: 32.2 g/dL (ref 30.0–36.0)
MCV: 89.7 fL (ref 80.0–100.0)
MONO ABS: 0.7 10*3/uL (ref 0.1–1.0)
Monocytes Relative: 8 %
NEUTROS ABS: 5.3 10*3/uL (ref 1.7–7.7)
NEUTROS PCT: 56 %
Platelets: 291 10*3/uL (ref 150–400)
RBC: 4.16 MIL/uL (ref 3.87–5.11)
RDW: 12.2 % (ref 11.5–15.5)
WBC: 9.3 10*3/uL (ref 4.0–10.5)
nRBC: 0 % (ref 0.0–0.2)

## 2018-10-29 LAB — I-STAT BETA HCG BLOOD, ED (MC, WL, AP ONLY): I-stat hCG, quantitative: <5 m[IU]/mL (ref <5)

## 2018-10-29 LAB — CBG MONITORING, ED: Glucose-Capillary: 342 mg/dL — ABNORMAL HIGH (ref 70–99)

## 2018-10-29 MED ORDER — VANCOMYCIN HCL IN DEXTROSE 1-5 GM/200ML-% IV SOLN
1000.0000 mg | Freq: Once | INTRAVENOUS | Status: AC
  Administered 2018-10-29: 1000 mg via INTRAVENOUS
  Filled 2018-10-29: qty 200

## 2018-10-29 MED ORDER — INSULIN GLARGINE 100 UNIT/ML ~~LOC~~ SOLN: 20.0000 [IU] | Freq: Every day | SUBCUTANEOUS | Status: DC

## 2018-10-29 MED ORDER — LORATADINE 10 MG PO TABS: 10.0000 mg | ORAL_TABLET | Freq: Every day | ORAL | Status: DC

## 2018-10-29 MED ORDER — ALBUTEROL SULFATE HFA 108 (90 BASE) MCG/ACT IN AERS: 1.0000 | INHALATION_SPRAY | Freq: Four times a day (QID) | RESPIRATORY_TRACT | Status: DC | PRN

## 2018-10-29 MED ORDER — VANCOMYCIN HCL IN DEXTROSE 1-5 GM/200ML-% IV SOLN: 1000.0000 mg | Freq: Once | INTRAVENOUS | Status: DC

## 2018-10-29 MED ORDER — ACETAMINOPHEN 500 MG PO TABS
1000.0000 mg | ORAL_TABLET | Freq: Once | ORAL | Status: AC
  Administered 2018-10-29: 1000 mg via ORAL
  Filled 2018-10-29: qty 2

## 2018-10-29 MED ORDER — CLINDAMYCIN HCL 150 MG PO CAPS: 450.0000 mg | ORAL_CAPSULE | Freq: Three times a day (TID) | ORAL | 0 refills | Status: AC

## 2018-10-29 MED ORDER — HYDROCODONE-ACETAMINOPHEN 10-325 MG PO TABS: 1.0000 | ORAL_TABLET | Freq: Three times a day (TID) | ORAL | Status: DC | PRN

## 2018-10-29 MED ORDER — INSULIN ASPART 100 UNIT/ML ~~LOC~~ SOLN: 0.0000 [IU] | Freq: Three times a day (TID) | SUBCUTANEOUS | Status: DC

## 2018-10-29 MED ORDER — SODIUM CHLORIDE 0.9 % IV BOLUS
1000.0000 mL | Freq: Once | INTRAVENOUS | Status: AC
  Administered 2018-10-29: 1000 mL via INTRAVENOUS

## 2018-10-29 MED ORDER — SODIUM CHLORIDE 0.9 % IV SOLN
2.0000 g | INTRAVENOUS | Status: DC
  Administered 2018-10-29: 2 g via INTRAVENOUS
  Filled 2018-10-29: qty 20

## 2018-10-29 MED ORDER — VANCOMYCIN HCL 10 G IV SOLR
1250.0000 mg | Freq: Three times a day (TID) | INTRAVENOUS | Status: DC
  Filled 2018-10-29: qty 1250

## 2018-10-29 MED ORDER — GABAPENTIN 600 MG PO TABS: 1200.0000 mg | ORAL_TABLET | Freq: Three times a day (TID) | ORAL | Status: DC

## 2018-10-29 MED ORDER — ATORVASTATIN CALCIUM 20 MG PO TABS: 20.0000 mg | ORAL_TABLET | Freq: Every day | ORAL | Status: DC

## 2018-10-29 MED ORDER — PANTOPRAZOLE SODIUM 40 MG PO TBEC: 40.0000 mg | DELAYED_RELEASE_TABLET | Freq: Every day | ORAL | Status: DC

## 2018-10-29 MED ORDER — HEPARIN SODIUM (PORCINE) 5000 UNIT/ML IJ SOLN: 5000.0000 [IU] | Freq: Three times a day (TID) | INTRAMUSCULAR | Status: DC

## 2018-10-29 MED ORDER — GADOBUTROL 1 MMOL/ML IV SOLN
10.0000 mL | Freq: Once | INTRAVENOUS | Status: AC | PRN
  Administered 2018-10-29: 10 mL via INTRAVENOUS

## 2018-10-29 MED ORDER — CIPROFLOXACIN HCL 750 MG PO TABS: 750.0000 mg | ORAL_TABLET | Freq: Two times a day (BID) | ORAL | 0 refills | Status: AC

## 2018-10-29 NOTE — ED Provider Notes (Signed)
[facility] EMERGENCY DEPARTMENT Provider Note   CSN: [id number] Arrival date & time: 10/29/18  1024     History   Chief Complaint Chief Complaint  Patient presents with  . Ingrown Toenail  . Hyperglycemia    HPI [name] is a 42 y.o. female.  [name] is a 42 y.o. Female with a history of diabetes, hypertension, hyperlipidemia, COPD, GERD, morbid obesity, osteomyelitis of the left foot and ankle S/P left BKA, who presents to the emergency department for evaluation of infection in her right great toe.  She reports she is followed by Dr. Evans with podiatry and about a month ago  she had an ingrown toenail procedure done with Dr. Evans, and since then she has had persisting issues with infection, she has been seen in the office for repeat debridement and has been using gentamicin ointment over the toe and has been on a course of Bactrim as well as a course of Cipro based on culture and sensitivity from the wound.  But reports after completing her course of Cipro 1 week ago infection seem to come back worse and now her whole toe is red and painful to the touch.  She reports a small amount of purulent drainage, but for the most part she thinks the toe looks yellowed because of the yellow antibiotic ointment she is been applying to it.  Denies fevers, reports some nausea yesterday but she reports the redness seems to be spreading and she is concerned that top of her foot looks a bit red now and the pain has worsened.  She is diabetic and has poorly controlled sugars, CBG was 435 with EMS.      Past Medical History:  Diagnosis Date  . Acute osteomyelitis of ankle or foot, left ([name]) 01/31/2018  . Alveolar hypoventilation   . Anemia    not on iron pill  . Asthma   . Bipolar 2 disorder ([name])   . Carpal tunnel syndrome on right    recurrent  . Cellulitis 08/2010-08/2011  . Chronic pain   . COPD (chronic obstructive pulmonary disease) ([name])    Symbicort daily and  Proventil as needed  . Costochondritis   . Depression   . Diabetes mellitus 2000   Type 2, Uncontrolled.Takes Lantus daily.Fasting blood sugar runs 150  . Dizziness    occasionally  . Drug-seeking behavior   . GERD (gastroesophageal reflux disease)    takes Pantoprazole and Zantac daily  . Headache    migraine-last one about a yr ago.Topamax daily  . HLD (hyperlipidemia)    takes Atorvastatin daily  . Hypertension    takes Lisinopril and Coreg daily  . Morbid obesity ([name])   . Muscle spasm    takes Flexeril as needed  . Nocturia   . Obstructive sleep apnea   . Peripheral neuropathy    takes Gabapentin daily  . Pneumonia    "walking" several yrs ago and as a baby  . Rectal fissure   . Restless leg   . Urinary frequency   . Varicose veins    Right medial thigh and Left leg     Patient Active Problem List   Diagnosis Date Noted  . Vulvovaginitis 10/23/2018  . Encounter for orthopedic aftercare following surgical amputation 06/28/2018  . Uncontrolled type 2 diabetes mellitus with insulin therapy ([name]) 04/16/2018  . Amputation stump infection ([name]) 04/09/2018  . Allergic rhinitis 03/06/2018  . Unilateral complete BKA, left, sequela ([name])   . Decreased pedal pulses   06/10/2017  . Unilateral primary osteoarthritis, right knee 10/22/2016  . Primary osteoarthritis of first carpometacarpal joint of left hand 07/30/2016  . Diabetic neuropathy ([name]) 07/14/2016  . Syncope 02/25/2016  . De Quervain's tenosynovitis, bilateral 11/01/2015  . Vitamin D deficiency 09/05/2015  . Recurrent candidiasis of vagina 09/05/2015  . Varicose veins of leg with complications 06/11/2015  . Restless leg syndrome 10/17/2014  . Chronic sinusitis 07/18/2014  . Headache 07/15/2014  . Urge incontinence 10/15/2013  . Encounter for chronic pain management 06/30/2013  . HLD (hyperlipidemia) 11/19/2012  . Chest pain 06/27/2012  . Right carpal tunnel syndrome 09/01/2011  . Bilateral knee pain 09/01/2011   . Diabetes mellitus ([name]) 05/22/2008  . Morbid obesity ([name]) 05/22/2008  . 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 Depression 05/22/2008  . Obstructive sleep apnea 05/22/2008  . Hypertension 05/22/2008  . Asthma 05/22/2008  . GERD 05/22/2008    Past Surgical History:  Procedure Laterality Date  . AMPUTATION Left 02/01/2018   Procedure: LEFT FOURTH AND 5TH TOE RAY AMPUTATION;  Surgeon: Duda, Marcus V, MD;  Location: [facility] OR;  Service: Orthopedics;  Laterality: Left;  . AMPUTATION Left 03/03/2018   Procedure: LEFT BELOW KNEE AMPUTATION;  Surgeon: Duda, Marcus V, MD;  Location: [facility] OR;  Service: Orthopedics;  Laterality: Left;  . CARPAL TUNNEL RELEASE Bilateral   . CESAREAN SECTION    . KNEE ARTHROSCOPY Right 07/17/2010  . LEFT HEART CATHETERIZATION WITH CORONARY ANGIOGRAM N/A 07/27/2012   Procedure: LEFT HEART CATHETERIZATION WITH CORONARY ANGIOGRAM;  Surgeon: Michael Cooper, MD;  Location: [facility] CATH LAB;  Service: Cardiovascular;  Laterality: N/A;  . left knee surgery     screws she thinks  . MASS EXCISION N/A 06/29/2013   Procedure:  WIDE LOCAL EXCISION OF POSTERIOR NECK ABSCESS;  Surgeon: Armando Ramirez, MD;  Location: [facility] OR;  Service: General;  Laterality: N/A;     OB History    Gravida  2   Para  1   Term      Preterm      AB  1   Living  1     SAB  1   TAB      Ectopic      Multiple      Live Births               Home Medications    Prior to Admission medications   Medication Sig Start Date End Date Taking? Authorizing Provider  ACCU-CHEK SOFTCLIX LANCETS lancets Use as instructed to check blood sugar 3 times per day 03/16/18   Angiulli, Daniel J, PA-C  albuterol (PROVENTIL HFA;VENTOLIN HFA) 108 (90 Base) MCG/ACT inhaler Inhale 1-2 puffs into the lungs every 6 (six) hours as needed for wheezing or shortness of breath. 01/17/18   McIntyre, Brittany J, MD  atorvastatin (LIPITOR) 20 MG tablet Take 1 tablet (20 mg total) by mouth daily. 03/16/18   Angiulli,  Daniel J, PA-C  Blood Glucose Monitoring Suppl (ACCU-CHEK NANO SMARTVIEW) w/Device KIT Use to check blood sugar 3 times daily 03/16/18   Angiulli, Daniel J, PA-C  budesonide-formoterol (SYMBICORT) 160-4.5 MCG/ACT inhaler inhale 2 PUFFS into THE lungs 2 TIMES DAILY 10/10/18   Hensel, William A, MD  cetirizine (ZYRTEC) 10 MG tablet Take 1 tablet (10 mg total) by mouth daily. Patient taking differently: Take 10 mg by mouth daily as needed for allergies.  02/28/18   McIntyre, Brittany J, MD  Cholecalciferol 1000 units tablet Take 1 tablet (1,000 Units total) by mouth daily. 04/05/17     McIntyre, Brittany J, MD  ciprofloxacin (CIPRO) 500 MG tablet Take 1 tablet (500 mg total) by mouth 2 (two) times daily. 10/09/18   Evans, Brent M, DPM  clindamycin (CLEOCIN) 150 MG capsule clindamycin HCl 150 mg capsule  TAKE 3 CAPSULES BY MOUTH 3 TIMES DAILY    [provider]  fluconazole (DIFLUCAN) 200 MG tablet fluconazole 200 mg tablet  TAKE 1 TABLET BY MOUTH EVERY 3 DAYS FOR 3 DOSES    [provider]  gabapentin (NEURONTIN) 600 MG tablet Take 2 tablets (1,200 mg total) by mouth 3 (three) times daily. 06/20/18   McIntyre, Brittany J, MD  gentamicin cream (GARAMYCIN) 0.1 % Apply 1 application topically 3 (three) times daily. 09/11/18   Evans, Brent M, DPM  glucose blood (ACCU-CHEK GUIDE) test strip USE T0 CHECK BLOOD SUGAR 3 TIMES DAILY 03/16/18   Angiulli, Daniel J, PA-C  HYDROcodone-acetaminophen (NORCO) 10-325 MG tablet Take 1 tablet by mouth every 8 (eight) hours as needed. 10/17/18 11/16/18  McIntyre, Brittany J, MD  insulin aspart (NOVOLOG) 100 UNIT/ML FlexPen Inject 6 Units into the skin 3 (three) times daily with meals. Patient taking differently: Inject 22 Units into the skin 3 (three) times daily with meals. Plus sliding scale 03/16/18   Angiulli, Daniel J, PA-C  insulin degludec (TRESIBA FLEXTOUCH) 100 UNIT/ML SOPN FlexTouch Pen Inject 85 Units into the skin daily.    [provider]    Insulin Glargine (LANTUS SOLOSTAR) 100 UNIT/ML Solostar Pen Inject into the skin. 12/09/15   [provider]  lisinopril (PRINIVIL,ZESTRIL) 2.5 MG tablet Take 2.5 mg by mouth daily.    [provider]  metFORMIN (GLUCOPHAGE-XR) 500 MG 24 hr tablet Take 500 mg by mouth daily with breakfast.  04/26/18 04/26/19  [provider]  nystatin (MYCOSTATIN/NYSTOP) powder Apply topically 4 (four) times daily. Patient taking differently: Apply 1 g topically 4 (four) times daily.  06/20/18   McIntyre, Brittany J, MD  nystatin cream (MYCOSTATIN) Apply 1 application topically 2 (two) times daily. 06/20/18   McIntyre, Brittany J, MD  pantoprazole (PROTONIX) 40 MG tablet Take 1 tablet (40 mg total) by mouth daily. 03/16/18   Angiulli, Daniel J, PA-C  terconazole (TERAZOL 7) 0.4 % vaginal cream terconazole 0.4 % vaginal cream  INSERT 1 APPLICATORFUL VAGINALLY ONCE A DAY FOR 2 DAYS    [provider]    Family History Family History  Problem Relation Age of Onset  . Diabetes Mother   . Hyperlipidemia Mother   . Depression Mother   . Varicose Veins Mother   . Heart attack Paternal Uncle   . Heart disease Paternal Grandmother   . Heart attack Paternal Grandmother   . Heart attack Paternal Grandfather   . Heart disease Paternal Grandfather   . Heart attack Father   . Cancer Maternal Grandmother        COLON  . Hypertension Maternal Grandmother   . Hyperlipidemia Maternal Grandmother   . Diabetes Maternal Grandmother   . Other Maternal Grandfather        GUN SHOT    Social History Social History   Tobacco Use  . Smoking status: Former Smoker    Packs/day: 0.30    Years: 0.30    Pack years: 0.09    Types: Cigarettes    Last attempt to quit: 12/06/1993    Years since quitting: 24.9  . Smokeless tobacco: Never Used  Substance Use Topics  . Alcohol use: No    Alcohol/week: 0.0 standard drinks  .   Drug use: Yes    Comment: OD attempts on home meds       Allergies    Kiwi extract; Trental [pentoxifylline]; and Nubain [nalbuphine hcl]   Review of Systems Review of Systems  Constitutional: Negative for chills and fever.  HENT: Negative.   Eyes: Negative for visual disturbance.  Respiratory: Negative for cough and shortness of breath.   Gastrointestinal: Positive for nausea. Negative for abdominal pain and vomiting.  Genitourinary: Negative for dysuria.  Musculoskeletal: Positive for arthralgias and joint swelling.  Skin: Positive for color change and wound.  Neurological: Negative for weakness and numbness.     Physical Exam Updated Vital Signs BP (!) 152/82 (BP Location: Right Arm)   Pulse 86   Temp 98.2 F (36.8 C) (Oral)   Resp 18   Ht 5' 2" (1.575 m)   Wt (!) 139.7 kg   LMP 09/28/2018 (Approximate)   SpO2 96%   BMI 56.33 kg/m   Physical Exam  Constitutional: She appears well-developed and well-nourished. No distress.  Well-appearing and in no acute distress  HENT:  Head: Normocephalic and atraumatic.  Mouth/Throat: Oropharynx is clear and moist.  Eyes: Right eye exhibits no discharge. Left eye exhibits no discharge.  Neck: Neck supple.  Cardiovascular: Normal rate, regular rhythm, normal heart sounds and intact distal pulses.  Pulmonary/Chest: Effort normal and breath sounds normal. No respiratory distress.  Respirations equal and unlabored, patient able to speak in full sentences, lungs clear to auscultation bilaterally  Abdominal: Soft. Bowel sounds are normal. She exhibits no distension and no mass. There is no tenderness. There is no guarding.  Abdomen soft, nondistended, nontender to palpation in all quadrants without guarding or peritoneal signs  Musculoskeletal:  Right great toe is erythematous to the base of the MTP joint and very tender to palpation, there is a small amount of purulent drainage but no area of fluctuance, and no increase expressible drainage, the toenail itself is yellowed, likely due to yellowing  gentamicin ointment.  Patient has pain with range of motion of the toe, there is no lymphangitic streaking over the foot.  Patient has 2+ DP and TP pulses and sensation is intact. Left leg S/P BKA  Neurological: She is alert. Coordination normal.  Skin: Skin is warm and dry. Capillary refill takes less than 2 seconds. She is not diaphoretic.  Psychiatric: She has a normal mood and affect. Her behavior is normal.  Nursing note and vitals reviewed.         ED Treatments / Results  Labs (all labs ordered are listed, but only abnormal results are displayed) Labs Reviewed  BASIC METABOLIC PANEL - Abnormal; Notable for the following components:      Result Value   Sodium 131 (*)    Glucose, Bld 413 (*)    Calcium 8.8 (*)    All other components within normal limits  CBG MONITORING, ED - Abnormal; Notable for the following components:   Glucose-Capillary 342 (*)    All other components within normal limits  CULTURE, BLOOD (ROUTINE X 2)  CULTURE, BLOOD (ROUTINE X 2)  CBC WITH DIFFERENTIAL/PLATELET  I-STAT BETA HCG BLOOD, ED ([facility], [facility], [facility] ONLY)    EKG None  Radiology Dg Foot Complete Right  Result Date: 10/29/2018 CLINICAL DATA:  Red and purple discoloration of great toe with yellow pus coming from the side, ingrown toenail, swelling, fever, diabetes mellitus EXAM: RIGHT FOOT COMPLETE - 3+ VIEW COMPARISON:  08/15/2018 FINDINGS: Osseous mineralization normal. Joint spaces preserved. Small sharply  defined defect identified at the articular surface, base of distal phalanx great toe laterally, unchanged since 08/15/2018; no interval changes to suggest healing fracture. No additional fracture, dislocation, or bone destruction. Scattered small vessel vascular calcifications. Soft tissue swelling great toe and at dorsum of forefoot without soft tissue gas. IMPRESSION: Soft tissue swelling without definite acute bony findings. Cortical lucency at articular surface, base of distal phalanx great  toe, question artifact versus nondisplaced fracture. Plantar calcaneal spurring. Electronically Signed   By: Lavonia Dana M.D.   On: 10/29/2018 11:44    Procedures Procedures (including critical care time)  Medications Ordered in ED Medications  sodium chloride 0.9 % bolus 1,000 mL (has no administration in time range)     Initial Impression / Assessment and Plan / ED Course  I have reviewed the triage vital signs and the nursing notes.  Pertinent labs & imaging results that were available during my care of the patient were reviewed by me and considered in my medical decision making (see chart for details).  41 year old female with history of osteomyelitis and BKA of left leg earlier this year, presents with persisting infection of the right toe after infected ingrown toenail was partially removed with podiatry about a month ago, has been on multiple courses of outpatient antibiotics without resolution, coming in today because of worsening redness and pain over the toe.  Denies fevers had some nausea but denies any other systemic symptoms.  Patient is diabetic and poorly controlled with a blood sugar of 435 with EMS today.  We will get labs and x-ray, feel that patient will likely require admission for cellulitis not improving with outpatient antibiotics at a minimum but there is also concern for osteomyelitis.  11:40 AM Case discussed with Dr. Daylene Katayama with podiatry who is been following the patient in the office, reviewed patient's photos today and given multiple rounds of outpatient antibiotics agrees with admission for inpatient antibiotics and recommends MRI of the foot to assess for any signs of osteomyelitis given her recent BKA on the other leg and this persisting infection.  Reports he can be consulted for any intervention needed with the toe if they feel that there needs to be any drainage or further removal of the nail by inpatient team.  X-rays of the foot show soft tissue swelling  without obvious evidence of osteo-or other acute bony abnormalities, there is a cortical lucency noted at the articular surface of the distal phalanx of the great toe question artifact versus nondisplaced fracture, feel this is more than likely artifact.  Labs show no leukocytosis, normal hemoglobin, glucose is elevated at 413 but there are no signs of DKA, sodium mildly low which is expected with hyperglycemia, negative pregnancy, blood cultures collected.  Will start patient on IV vancomycin for cellulitis and toe infection that is been refractory to outpatient antibiotics.  And will consult family medicine practice for admission.  1:12 PM Case discussed with Dr. Ouida Sills with family medicine who will see and admit the patient.  Final Clinical Impressions(s) / ED Diagnoses   Final diagnoses:  Toe infection  Cellulitis of toe of right foot  Hyperglycemia    ED Discharge Orders    None       Jacqlyn Larsen, [place] 10/29/18 1314    Virgel Manifold, MD 11/08/18 1709

## 2018-10-29 NOTE — H&P (Addendum)
[facility] Admission History and Physical Service Pager: [phone]  Patient name: [name] Medical record number: [id number] Date of birth: 09-14-1976 Age: 42 y.o. Gender: female  Primary Care Provider: [name], MD Consultants: Podiatry Code Status: Full  Chief Complaint: Right painful, red 1st hallux  Assessment and Plan: [name] is a 42 y.o. female presenting with purulent cellulitis of right first hallux. PMH is significant for uncontrolled type 2 diabetes, left BKA, diabetic neuropathy, hyperlipidemia, hypertension, morbid obesity, obesity hypoventilation syndrome, obstructive sleep apnea, asthma, GERD, and depression.  Foot redness and pain: Patient presents with worsening pain to the right first hallux S/P nail avulsion and treatment with p.o. Bactrim and Cipro.  WBC count on admission within normal limits. Patient is afebrile and other than hypertension vitals are within normal limits.  She has a history of uncontrolled diabetes and had a left BKA secondary to osteomyelitis in February 2019. Wound has previously grown Pseudomonas at the podiatrist per patient report. Xray of the foot in the ED showed soft tissue swelling without definite acute bony findings and and a cortical lucency questionable for artifact vs nondisplaced fracture. The foot redness and swelling is most likely due to purulent cellulitis secondary to the nail avulsion. Imaging so far does not show signs of osteomyelitis but MRI is pending. Vitals, labs and clinical picture do not suggest systemic infection. Xray shows questionable sign of a fracture but there is no history of trauma.  -Admit to med surge, attending Dr. Owens Shark -Vitals per protocol -Up with assistance -IV ceftriaxone and vancomycin for MRSA and GNR coverage -MRI of Right Toes -AM CBC, BMP -Tylenol PRN pain -continue gentamicin topical -blood culture  Hypertension: Blood pressure on admission is 143/68.   Patient takes lisinopril 2.5 mg daily -Lisinopril  Type 2 diabetes with complications: The patient has a history of poorly controlled diabetes, and diabetic neuropathy with her CBG 400+ on admission.  Last A1c 9.6% in May 2019.  The patient takes [place] 85 units daily, NovoLog 6 units 3 times daily with meals, and metformin 500 mg daily.  She also takes gabapentin 1200 mg TID for neuropathy.  -CBG 4 times daily -Hold metformin -mSSI with meals and Lantus once daily -Continue gabapentin -Wound culture, Blood Culture -Repeat A1c  Hyperlipidemia: Patient has a history of hyperlipidemia, with most recent lipid panel showing cholesterol 343, triglycerides 456, HDL 35.  LDL unable to be calculated due to elevated triglycerides.  Patient is prescribed atorvastatin 20 daily -Continue atorvastatin 20 daily  Asthma/COPD: The patient has a history of asthma for which she takes Symbicort twice daily and albuterol prn. -Continue home meds  GERD: Patient takes Protonix 40 mg daily -Protonix  FEN/GI: mIVF, heart-healthy diet Prophylaxis: Lovenox  Disposition: Advised to admit to Med-Surg for IV Antibiotics and Imaging  History of Present Illness:  [name] is a 42 y.o. female presenting with progressively worsening pain and redness to the first toe of her right foot over the past week.  The patient had an ingrown toenail and opted for a nail avulsion procedure in October 2019 with her podiatrist.  Afterwards she was placed on Bactrim and topical gentamicin with instructions to return in 2 weeks.  She saw her physician again 10/09/2018 at which the open wound was debrided and covered with a bandage and antibiotic topical.  She was instructed to continue using gentamicin and was prescribed oral ciprofloxacin.  The patient reports she completed the course of Cipro last week, after which  her toe began to become more red and inflamed.  She continues to apply gentamicin topical to the affected area.  The  patient feels there is some mechanical damage from her pivoting while walking and feels this is irritated the toe more.  Toe has become more red and painful over the last 2days.  She tried soaking her toe a couple of days ago and has been keeping it covered.  She denies fevers, chills, body aches.  She denies any other medication changes.    Of note, the patient is in hysterics in the emergency department that if she is admitted to the hospital her daughter will have no where for her 55 year old daughter to go she has no help in raising her.  Review Of Systems: Per HPI with the following additions:   Review of Systems  Constitutional: Negative for chills and fever.  Musculoskeletal:       Pain and redness of right hallux  Psychiatric/Behavioral: The patient is nervous/anxious.    Patient Active Problem List   Diagnosis Date Noted  . Cellulitis of great toe of right foot 10/29/2018  . Vulvovaginitis 10/23/2018  . Encounter for orthopedic aftercare following surgical amputation 06/28/2018  . Uncontrolled type 2 diabetes mellitus with insulin therapy ([place]) 04/16/2018  . Amputation stump infection ([place]) 04/09/2018  . Allergic rhinitis 03/06/2018  . Unilateral complete BKA, left, sequela ([place])   . Decreased pedal pulses 06/10/2017  . Unilateral primary osteoarthritis, right knee 10/22/2016  . Primary osteoarthritis of first carpometacarpal joint of left hand 07/30/2016  . Diabetic neuropathy ([place]) 07/14/2016  . Syncope 02/25/2016  . De Quervain's tenosynovitis, bilateral 11/01/2015  . Vitamin D deficiency 09/05/2015  . Recurrent candidiasis of vagina 09/05/2015  . Varicose veins of leg with complications 70/12/7492  . Restless leg syndrome 10/17/2014  . Chronic sinusitis 07/18/2014  . Headache 07/15/2014  . Urge incontinence 10/15/2013  . Encounter for chronic pain management 06/30/2013  . HLD (hyperlipidemia) 11/19/2012  . Chest pain 06/27/2012  . Right carpal tunnel syndrome  09/01/2011  . Bilateral knee pain 09/01/2011  . Diabetes mellitus ([place]) 05/22/2008  . Morbid obesity ([place]) 05/22/2008  . 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 Depression 05/22/2008  . Obstructive sleep apnea 05/22/2008  . Hypertension 05/22/2008  . Asthma 05/22/2008  . GERD 05/22/2008    Past Medical History: Past Medical History:  Diagnosis Date  . Acute osteomyelitis of ankle or foot, left ([place]) 01/31/2018  . Alveolar hypoventilation   . Anemia    not on iron pill  . Asthma   . Bipolar 2 disorder ([place])   . Carpal tunnel syndrome on right    recurrent  . Cellulitis 08/2010-08/2011  . Chronic pain   . COPD (chronic obstructive pulmonary disease) ([name])    Symbicort daily and Proventil as needed  . Costochondritis   . Depression   . Diabetes mellitus 2000   Type 2, Uncontrolled.Takes Lantus daily.Fasting blood sugar runs 150  . Dizziness    occasionally  . Drug-seeking behavior   . GERD (gastroesophageal reflux disease)    takes Pantoprazole and Zantac daily  . Headache    migraine-last one about a yr ago.Topamax daily  . HLD (hyperlipidemia)    takes Atorvastatin daily  . Hypertension    takes Lisinopril and Coreg daily  . Morbid obesity ([place])   . Muscle spasm    takes Flexeril as needed  . Nocturia   . Obstructive sleep apnea   . Peripheral neuropathy    takes  Gabapentin daily  . Pneumonia    "walking" several yrs ago and as a baby  . Rectal fissure   . Restless leg   . Urinary frequency   . Varicose veins    Right medial thigh and Left leg     Past Surgical History: Past Surgical History:  Procedure Laterality Date  . AMPUTATION Left 02/01/2018   Procedure: LEFT FOURTH AND 5TH TOE RAY AMPUTATION;  Surgeon: [name], MD;  Location: [facility];  Service: Orthopedics;  Laterality: Left;  . AMPUTATION Left 03/03/2018   Procedure: LEFT BELOW KNEE AMPUTATION;  Surgeon: [name], MD;  Location: [facility];  Service: Orthopedics;  Laterality: Left;  .  CARPAL TUNNEL RELEASE Bilateral   . CESAREAN SECTION    . KNEE ARTHROSCOPY Right 07/17/2010  . LEFT HEART CATHETERIZATION WITH CORONARY ANGIOGRAM N/A 07/27/2012   Procedure: LEFT HEART CATHETERIZATION WITH CORONARY ANGIOGRAM;  Surgeon: Sherren Mocha, MD;  [facility] CATH LAB;  Service: Cardiovascular;  Laterality: N/A;  . left knee surgery     screws she thinks  . MASS EXCISION N/A 06/29/2013   Procedure:  WIDE LOCAL EXCISION OF POSTERIOR NECK ABSCESS;  Surgeon: Ralene Ok, MD;  Location: [facility];  Service: General;  Laterality: N/A;    Social History: Social History   Tobacco Use  . Smoking status: Former Smoker    Packs/day: 0.30    Years: 0.30    Pack years: 0.09    Types: Cigarettes    Last attempt to quit: 12/06/1993    Years since quitting: 24.9  . Smokeless tobacco: Never Used  Substance Use Topics  . Alcohol use: No    Alcohol/week: 0.0 standard drinks  . Drug use: Yes    Comment: OD attempts on home meds     Additional social history:   Please also refer to relevant sections of EMR.  Family History: Family History  Problem Relation Age of Onset  . Diabetes Mother   . Hyperlipidemia Mother   . Depression Mother   . Varicose Veins Mother   . Heart attack Paternal Uncle   . Heart disease Paternal Grandmother   . Heart attack Paternal Grandmother   . Heart attack Paternal Grandfather   . Heart disease Paternal Grandfather   . Heart attack Father   . Cancer Maternal Grandmother        COLON  . Hypertension Maternal Grandmother   . Hyperlipidemia Maternal Grandmother   . Diabetes Maternal Grandmother   . Other Maternal Grandfather        GUN SHOT   Allergies and Medications: Allergies  Allergen Reactions  . Kiwi Extract Shortness Of Breath and Swelling  . Trental [Pentoxifylline] Nausea And Vomiting  . Nubain [Nalbuphine Hcl] Other (See Comments)    "FEELS LIKE SOMETHING CRAWLING ON ME"   No current facility-administered medications on file prior to  encounter.    Current Outpatient Medications on File Prior to Encounter  Medication Sig Dispense Refill  . albuterol (PROVENTIL HFA;VENTOLIN HFA) 108 (90 Base) MCG/ACT inhaler Inhale 1-2 puffs into the lungs every 6 (six) hours as needed for wheezing or shortness of breath. 18 g 5  . atorvastatin (LIPITOR) 20 MG tablet Take 1 tablet (20 mg total) by mouth daily. 30 tablet 5  . budesonide-formoterol (SYMBICORT) 160-4.5 MCG/ACT inhaler inhale 2 PUFFS into THE lungs 2 TIMES DAILY (Patient taking differently: Inhale 2 puffs into the lungs 2 (two) times daily. ) 10.2 g 4  . cetirizine (ZYRTEC) 10 MG  tablet Take 1 tablet (10 mg total) by mouth daily. (Patient taking differently: Take 10 mg by mouth daily as needed for allergies. ) 30 tablet 11  . Cholecalciferol 1000 units tablet Take 1 tablet (1,000 Units total) by mouth daily. 90 tablet 0  . gabapentin (NEURONTIN) 600 MG tablet Take 2 tablets (1,200 mg total) by mouth 3 (three) times daily. 180 tablet 5  . gentamicin cream (GARAMYCIN) 0.1 % Apply 1 application topically 3 (three) times daily. 30 g 1  . HYDROcodone-acetaminophen (NORCO) 10-325 MG tablet Take 1 tablet by mouth every 8 (eight) hours as needed. (Patient taking differently: Take 1 tablet by mouth every 8 (eight) hours as needed for moderate pain. ) 90 tablet 0  . insulin aspart (NOVOLOG) 100 UNIT/ML FlexPen Inject 6 Units into the skin 3 (three) times daily with meals. 15 mL 11  . insulin degludec (TRESIBA FLEXTOUCH) 100 UNIT/ML SOPN FlexTouch Pen Inject 85 Units into the skin daily.    Marland Kitchen lisinopril (PRINIVIL,ZESTRIL) 2.5 MG tablet Take 2.5 mg by mouth daily.    . metFORMIN (GLUCOPHAGE-XR) 500 MG 24 hr tablet Take 500 mg by mouth daily with breakfast.     . nystatin (MYCOSTATIN/NYSTOP) powder Apply topically 4 (four) times daily. (Patient taking differently: Apply 1 g topically 4 (four) times daily. ) 60 g 2  . nystatin cream (MYCOSTATIN) Apply 1 application topically 2 (two) times daily. 60  g 2  . pantoprazole (PROTONIX) 40 MG tablet Take 1 tablet (40 mg total) by mouth daily. 30 tablet 1  . ACCU-CHEK SOFTCLIX LANCETS lancets Use as instructed to check blood sugar 3 times per day 100 each 12  . Blood Glucose Monitoring Suppl (ACCU-CHEK NANO SMARTVIEW) w/Device KIT Use to check blood sugar 3 times daily 1 kit 0  . glucose blood (ACCU-CHEK GUIDE) test strip USE T0 CHECK BLOOD SUGAR 3 TIMES DAILY 100 each 5   Objective: BP (!) 143/68   Pulse 82   Temp 98.4 F (36.9 C) (Oral)   Resp 16   Ht '5\' 2"'  (1.575 m)   Wt (!) 139.7 kg   LMP 09/28/2018 (Approximate)   SpO2 95%   BMI 56.33 kg/m   Physical Exam  Constitutional: She appears well-developed and well-nourished.  Tearful  Cardiovascular: Normal rate, regular rhythm and normal heart sounds.  Pulmonary/Chest: Effort normal and breath sounds normal. No respiratory distress.  Abdominal: Soft. Bowel sounds are normal.  Obese  Musculoskeletal: She exhibits deformity (Left BKA).  Erythematous, edematous first hallux of right foot, warm and tender to palpation  Neurological: She is alert.  Skin: Skin is warm and dry.      Labs and Imaging: CBC BMET  Recent Labs  Lab 10/29/18 1135  WBC 9.3  HGB 12.0  HCT 37.3  PLT 291   Recent Labs  Lab 10/29/18 1135  NA 131*  K 3.8  CL 99  CO2 23  BUN 11  CREATININE 0.87  GLUCOSE 413*  CALCIUM 8.8*     Dg Foot Complete Right  Result Date: 10/29/2018 CLINICAL DATA:  Red and purple discoloration of great toe with yellow pus coming from the side, ingrown toenail, swelling, fever, diabetes mellitus EXAM: RIGHT FOOT COMPLETE - 3+ VIEW COMPARISON:  08/15/2018 FINDINGS: Osseous mineralization normal. Joint spaces preserved. Small sharply defined defect identified at the articular surface, base of distal phalanx great toe laterally, unchanged since 08/15/2018; no interval changes to suggest healing fracture. No additional fracture, dislocation, or bone destruction. Scattered small  vessel vascular  calcifications. Soft tissue swelling great toe and at dorsum of forefoot without soft tissue gas. IMPRESSION: Soft tissue swelling without definite acute bony findings. Cortical lucency at articular surface, base of distal phalanx great toe, question artifact versus nondisplaced fracture. Plantar calcaneal spurring. Electronically Signed   By: Lavonia Dana M.D.   On: 10/29/2018 11:44    Daisy Floro, DO 10/29/2018, 3:44 PM PGY-1, [place] Intern pager: [phone], text pages welcome  FPTS Upper-Level Resident Addendum   I have independently interviewed and examined the patient. I have discussed the above with the original author and agree with their documentation. My edits for correction/addition/clarification are in blue. Please see also any attending notes.    Ralene Ok, MD PGY-3, [place] Service pager: [phone] (text pages welcome through [facility])

## 2018-10-29 NOTE — Care Management (Addendum)
ED CM received consult from [facility]. Patient evaluated in the ED and recommendation is for hospital stay to start on ABT. CM met with patient at bedside patient was tearful stating she cannot stay she has no one to care for 40 yo daughter who cannot miss school. Spoke with patient about discussing with admitting providers concerning other options if possible.

## 2018-10-29 NOTE — ED Notes (Signed)
Admitting MD to come speak with pt in regards to MRI results.

## 2018-10-29 NOTE — ED Notes (Signed)
Pt tearful, stated she wants to leave AMA, admitting re-paged.

## 2018-10-29 NOTE — Progress Notes (Signed)
FPTS Interim Progress Note  S: called to discuss MRI results with patient.   O: BP (!) 132/94 (BP Location: Right Arm)   Pulse 88   Temp 98 F (36.7 C) (Oral)   Resp 16   Ht 5\' 2"  (1.575 m)   Wt (!) 139.7 kg   LMP 09/28/2018 (Approximate)   SpO2 100%   BMI 56.33 kg/m     A/P: R great toe redness: MRI c/w osteo. Patient on vanc and ceftriaxone, originally stating she would leave AMA to take care of her daughter. Now with osteo findings, she is tearful and wants to stay. Patient wants to know if we will have to "take her toe," I let her know we would talk to Dr. Sharol Given about this but cannot say for sure at present.  -admit to med surg -continue vanc/ceftriaxone -NPO MN -consult Dr. Sharol Given in am  Hyperglycemia: RN to obtain CBG and will give extra novolog to cover. Patient uses 6U TID WC at home with 85U triseba and has not used long acting today.  -lantus 20U now -novolog pending CBG  Sela Hilding, MD 10/29/2018, 6:48 PM PGY-3, [place] Medicine Service pager [phone]

## 2018-10-29 NOTE — Care Management (Signed)
ED CM Dr.Timberlake, and Dr. Ouida Sills in room, patient states, that she is not able to stay in the hospital due to needing to care for 40 yo daughter, and does not have any support.  Dr. Lindell Noe explained that she will discuss the issue  with her attending, to establish a care plan, CM will continue to follow to assist with patient's  treatment plan.

## 2018-10-29 NOTE — ED Notes (Signed)
Pt returned from MRI, admitting MD paged.

## 2018-10-29 NOTE — ED Notes (Signed)
Pt states that she can not stay even after speaking with MD and care management.  She states that she would like to go home on a picc line and antibiotics, she states that her daughter can not miss anymore school due to her being in legal trouble over her daughters absences related to her health problems in the past.  Await attending to come see pt.

## 2018-10-29 NOTE — ED Notes (Signed)
Care management notified of pt child care issue.  Await admitting MD and care management (who is also involving social work) to come speak with pt.   Per pt she has no one to care for her child. I asked about any family or friends who may be able to help and pt tells me that there is no one.  Case management in to speak with pt at this time

## 2018-10-29 NOTE — ED Notes (Signed)
Pt states that she would like to speak with her admitting MD.  She shares that she can't stay in the hospital as she does not have anyone to care for her 40 year old child.  Pt is tearful.  Admitting MD notified, charge RN notified.

## 2018-10-29 NOTE — Progress Notes (Addendum)
Pharmacy Antibiotic Note  [name] is a 42 y.o. female admitted on 10/29/2018 with cellulitis.  Pharmacy has been consulted for vancomycin dosing.  Presenting with worsening redness, pain, and purulent drainage. [place] foot soft tissue swelling w/o acute bony findings. WBC WNL, afeb. Received 1g IV vancomycin at 1217. Scr 0.87 (normCrCl >100 mL/min).   Plan: Order vancomycin 1 g IV once to get total bolus of 2g IV Vancomycin 1250 mg IV every 8 hours.  Goal trough 15-20 mcg/mL.  Monitor cx results, clinical pic, renal fx, and VT as appropriate  Height: 5\' 2"  (157.5 cm) Weight: (!) 308 lb (139.7 kg) IBW/kg (Calculated) : 50.1  Temp (24hrs), Avg:98.3 F (36.8 C), Min:98.2 F (36.8 C), Max:98.4 F (36.9 C)  Recent Labs  Lab 10/29/18 1135  WBC 9.3  CREATININE 0.87    Estimated Creatinine Clearance: 116.6 mL/min (by C-G formula based on SCr of 0.87 mg/dL).    Allergies  Allergen Reactions  . Kiwi Extract Shortness Of Breath and Swelling  . Trental [Pentoxifylline] Nausea And Vomiting  . Nubain [Nalbuphine Hcl] Other (See Comments)    "FEELS LIKE SOMETHING CRAWLING ON ME"    Antimicrobials this admission: Ceftriaxone 11/24 >> Vancomycin 11/24 >>   Dose adjustments this admission: N/A  Microbiology results: 11/24 BCx: NGTD  Thank you for allowing pharmacy to be a part of this patient's care.  Antonietta Jewel, PharmD Clinical Pharmacist  Pager: [phone] Phone: [phone] 10/29/2018 4:03 PM

## 2018-10-29 NOTE — ED Notes (Signed)
Admitting MD at bedside.

## 2018-10-29 NOTE — ED Triage Notes (Signed)
Pt is here for infection in right great toe.  Her toe is red and painful and swollen with yellow purulent discharge, no fever or chills.  CBG was 435 for ems.

## 2018-10-29 NOTE — ED Notes (Signed)
Paged Lindell Noe for [employer]

## 2018-10-29 NOTE — ED Notes (Signed)
Pt discharged, understands instructions and prescriptions. Cab called.

## 2018-10-29 NOTE — Progress Notes (Signed)
This encounter was created in error - please disregard.

## 2018-10-29 NOTE — ED Notes (Signed)
Pt states that she wants to know the results of her MRI and that will determine if she stays for admission or goes home AMA, admitting MD paged.

## 2018-10-30 ENCOUNTER — Encounter: Payer: Self-pay | Admitting: Physical Therapy

## 2018-10-30 DIAGNOSIS — M86171 Other acute osteomyelitis, right ankle and foot: Secondary | ICD-10-CM | POA: Diagnosis not present

## 2018-10-30 DIAGNOSIS — G4733 Obstructive sleep apnea (adult) (pediatric): Secondary | ICD-10-CM | POA: Diagnosis not present

## 2018-10-30 DIAGNOSIS — Z89512 Acquired absence of left leg below knee: Secondary | ICD-10-CM | POA: Diagnosis not present

## 2018-10-30 DIAGNOSIS — M7989 Other specified soft tissue disorders: Secondary | ICD-10-CM | POA: Diagnosis not present

## 2018-10-30 DIAGNOSIS — I10 Essential (primary) hypertension: Secondary | ICD-10-CM | POA: Diagnosis not present

## 2018-10-30 DIAGNOSIS — Z794 Long term (current) use of insulin: Secondary | ICD-10-CM | POA: Diagnosis not present

## 2018-10-30 DIAGNOSIS — M79674 Pain in right toe(s): Secondary | ICD-10-CM | POA: Diagnosis not present

## 2018-10-30 DIAGNOSIS — L539 Erythematous condition, unspecified: Secondary | ICD-10-CM | POA: Diagnosis not present

## 2018-10-30 DIAGNOSIS — E1165 Type 2 diabetes mellitus with hyperglycemia: Secondary | ICD-10-CM | POA: Diagnosis not present

## 2018-10-30 DIAGNOSIS — M869 Osteomyelitis, unspecified: Secondary | ICD-10-CM | POA: Diagnosis not present

## 2018-10-30 NOTE — Discharge Summary (Signed)
[facility] Discharge Summary  Patient name: [name] Medical record number: [id number] Date of birth: 01-06-1976 Age: 42 y.o. Gender: female Date of Admission: 10/29/2018  Date of Discharge: 10/29/2018 Admitting Physician: No admitting provider for patient encounter.  Primary Care Provider: [name], MD Consultants: none  Indication for Hospitalization: cellulitis  Discharge Diagnoses/Problem List:  Osteomyelitis of R great toe T2DM, poorly controlled HLD Asthma/COPD GERD  Disposition: left AMA  Discharge Condition: guarded, left AMA  Discharge Exam: left [facility]  Brief Hospital Course:  Patient presented with redness, warmth, pain of right great toe.  She is followed with podiatry for this as an outpatient.  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  She states she has been on 2 courses of antibiotics as an outpatient recently for her right great toe (Bactrim, Cipro). She finished the course of Cipro about 1 week prior to arrival.  ED provider was concerned with cellulitis and began treatment with vancomycin.  On our exam, concern for possible osteo-given underlying poor wound healing with type 2 diabetes and purulent pocket on the medial aspect of the right great toe.  MRI was performed stat, which resulted with findings suggestive of osteomyelitis.  Patient was also given ceftriaxone to cover for gram negatives.  Patient was planned to be admitted for further IV antibiotics orthopedics consultation (she follows with Dr. Sharol Given to as an outpatient), however she left AMA.  This is due to a complex social situation in which she states she is the sole caregiver for her 50 year old daughter who has truancy concerns with [facility] and patient states that they may be in the process of taking her daughter away.  Case management from the emergency room as well as  nurses and are in her family medicine team offered various ways to try to help her with this including writing notes or helping her find alternative caregivers.  However, she still elects to leave AMA despite extensive counseling that this could worsen into sepsis, loss of right limb, death.  She was provided prescription to her pharmacy with Cipro and clindamycin for temporizing therapy as she left AMA.  Issues for Follow Up:  1. Patient should seek care for osteomyelitis - ideally she would return for ED eval or direct clinic admission for treatment.  2. Follow up blood culture results.  3. We will try to have clinic team call patient and have her reach out to Dr. Amalia Hailey her podiatrist.   Significant Procedures: none  Significant Labs and Imaging:  Recent Labs  Lab 10/29/18 1135  WBC 9.3  HGB 12.0  HCT 37.3  PLT 291   Recent Labs  Lab 10/29/18 1135  NA 131*  K 3.8  CL 99  CO2 23  GLUCOSE 413*  BUN 11  CREATININE 0.87  CALCIUM 8.8*   Mr Toes Right W Wo Contrast  Result Date: 10/29/2018 CLINICAL DATA:  Great toe infection following ingrown toenail procedure. Repeated debridement. Discoloration with drainage. Evaluate for osteomyelitis. History of diabetes and left BKA for osteomyelitis. EXAM: MRI OF THE RIGHT TOES WITHOUT AND WITH CONTRAST TECHNIQUE: Multiplanar, multisequence MR imaging of the right toes was performed both before and after administration of intravenous contrast. CONTRAST:  10 cc Gadavist COMPARISON:  Foot radiographs 08/02/2016, 08/15/2018 and 10/29/2018. FINDINGS: Despite efforts by the technologist and patient, mild motion artifact is present on today's exam and could not be eliminated.  This reduces exam sensitivity and specificity. Bones/Joint/Cartilage There is abnormal marrow T2 hyperintensity and decreased T1 signal in the medial base of the distal 1st phalanx. The cortex in this area appears ill-defined the coronal T1 weighted images. Following contrast, there  is diffuse marrow enhancement. Mild T2 hyperintensity and enhancement are present within the head the proximal phalanx as well. There is no significant joint effusion. The additional toes appear normal. The metatarsals and visualized tarsal bones appear normal. There are no significant joint effusions. Ligaments Intact Lisfranc ligament. Muscles and Tendons No focal muscular abnormality or abnormal enhancement. No significant tenosynovitis. Soft tissues There is mild periarticular soft tissue enhancement surrounding the interphalangeal joint of the great toe and extending into the nail bed. No focal fluid collection demonstrated. IMPRESSION: 1. Abnormal T2 and T1 marrow signal adjacent to the interphalangeal joint of the great toe with associated marrow and surrounding soft tissue enhancement, supportive of osteomyelitis in this clinical context. 2. No evidence of soft tissue abscess. 3. The additional toes, metatarsals and visualized tarsal bones appear normal. Electronically Signed   By: Richardean Sale M.D.   On: 10/29/2018 18:10   Dg Foot Complete Right  Result Date: 10/29/2018 CLINICAL DATA:  Red and purple discoloration of great toe with yellow pus coming from the side, ingrown toenail, swelling, fever, diabetes mellitus EXAM: RIGHT FOOT COMPLETE - 3+ VIEW COMPARISON:  08/15/2018 FINDINGS: Osseous mineralization normal. Joint spaces preserved. Small sharply defined defect identified at the articular surface, base of distal phalanx great toe laterally, unchanged since 08/15/2018; no interval changes to suggest healing fracture. No additional fracture, dislocation, or bone destruction. Scattered small vessel vascular calcifications. Soft tissue swelling great toe and at dorsum of forefoot without soft tissue gas. IMPRESSION: Soft tissue swelling without definite acute bony findings. Cortical lucency at articular surface, base of distal phalanx great toe, question artifact versus nondisplaced fracture.  Plantar calcaneal spurring. Electronically Signed   By: Lavonia Dana M.D.   On: 10/29/2018 11:44     Results/Tests Pending at Time of Discharge: blood cultures  Discharge Medications:  Allergies as of 10/29/2018      Reactions   Kiwi Extract Shortness Of Breath, Swelling   Trental [pentoxifylline] Nausea And Vomiting   Nubain [nalbuphine Hcl] Other (See Comments)   "FEELS LIKE SOMETHING CRAWLING ON ME"      Medication List    TAKE these medications   ACCU-CHEK NANO SMARTVIEW w/Device Kit Use to check blood sugar 3 times daily   ACCU-CHEK SOFTCLIX LANCETS lancets Use as instructed to check blood sugar 3 times per day   albuterol 108 (90 Base) MCG/ACT inhaler Commonly known as:  PROVENTIL HFA;VENTOLIN HFA Inhale 1-2 puffs into the lungs every 6 (six) hours as needed for wheezing or shortness of breath.   atorvastatin 20 MG tablet Commonly known as:  LIPITOR Take 1 tablet (20 mg total) by mouth daily.   budesonide-formoterol 160-4.5 MCG/ACT inhaler Commonly known as:  SYMBICORT inhale 2 PUFFS into THE lungs 2 TIMES DAILY What changed:    how much to take  how to take this  when to take this  additional instructions   cetirizine 10 MG tablet Commonly known as:  ZYRTEC Take 1 tablet (10 mg total) by mouth daily. What changed:    when to take this  reasons to take this   Cholecalciferol 25 MCG (1000 UT) tablet Take 1 tablet (1,000 Units total) by mouth daily.   ciprofloxacin 750 MG tablet Commonly known as:  CIPRO Take  1 tablet (750 mg total) by mouth 2 (two) times daily for 14 days.   clindamycin 150 MG capsule Commonly known as:  CLEOCIN Take 3 capsules (450 mg total) by mouth 3 (three) times daily for 14 days.   gabapentin 600 MG tablet Commonly known as:  NEURONTIN Take 2 tablets (1,200 mg total) by mouth 3 (three) times daily.   gentamicin cream 0.1 % Commonly known as:  GARAMYCIN Apply 1 application topically 3 (three) times daily.   glucose  blood test strip USE T0 CHECK BLOOD SUGAR 3 TIMES DAILY   HYDROcodone-acetaminophen 10-325 MG tablet Commonly known as:  NORCO Take 1 tablet by mouth every 8 (eight) hours as needed. What changed:  reasons to take this   insulin aspart 100 UNIT/ML FlexPen Commonly known as:  NOVOLOG Inject 6 Units into the skin 3 (three) times daily with meals.   lisinopril 2.5 MG tablet Commonly known as:  PRINIVIL,ZESTRIL Take 2.5 mg by mouth daily.   metFORMIN 500 MG 24 hr tablet Commonly known as:  GLUCOPHAGE-XR Take 500 mg by mouth daily with breakfast.   nystatin cream Commonly known as:  MYCOSTATIN Apply 1 application topically 2 (two) times daily. What changed:  Another medication with the same name was changed. Make sure you understand how and when to take each.   nystatin powder Commonly known as:  MYCOSTATIN/NYSTOP Apply topically 4 (four) times daily. What changed:  how much to take   pantoprazole 40 MG tablet Commonly known as:  PROTONIX Take 1 tablet (40 mg total) by mouth daily.   TRESIBA FLEXTOUCH 100 UNIT/ML Sopn FlexTouch Pen Generic drug:  insulin degludec Inject 85 Units into the skin daily.       Discharge Instructions: Please refer to Patient Instructions section of EMR for full details.  Patient was counseled important signs and symptoms that should prompt return to medical care, changes in medications, dietary instructions, activity restrictions, and follow up appointments.   Follow-Up Appointments:  None made as patient left AMA.  Sela Hilding, MD 10/30/2018, 7:59 AM PGY-3, [place]

## 2018-10-31 ENCOUNTER — Encounter: Payer: Self-pay | Admitting: Physical Therapy

## 2018-10-31 ENCOUNTER — Telehealth: Payer: Self-pay | Admitting: Family Medicine

## 2018-10-31 DIAGNOSIS — M86171 Other acute osteomyelitis, right ankle and foot: Secondary | ICD-10-CM | POA: Diagnosis not present

## 2018-10-31 DIAGNOSIS — I10 Essential (primary) hypertension: Secondary | ICD-10-CM | POA: Diagnosis not present

## 2018-10-31 DIAGNOSIS — G4733 Obstructive sleep apnea (adult) (pediatric): Secondary | ICD-10-CM | POA: Diagnosis not present

## 2018-10-31 DIAGNOSIS — Z89512 Acquired absence of left leg below knee: Secondary | ICD-10-CM | POA: Diagnosis not present

## 2018-10-31 DIAGNOSIS — E1165 Type 2 diabetes mellitus with hyperglycemia: Secondary | ICD-10-CM | POA: Diagnosis not present

## 2018-10-31 NOTE — Telephone Encounter (Signed)
Called [name] to follow-up after ED evaluation on Sunday.  The [name] reports she was admitted to [facility] late last night.  She will be undergoing a right great toe amputation for her osteomyelitis.  Encourage [name] to call to schedule with Dr. [name] when she is discharged.

## 2018-11-01 DIAGNOSIS — Z794 Long term (current) use of insulin: Secondary | ICD-10-CM | POA: Diagnosis not present

## 2018-11-01 DIAGNOSIS — G4733 Obstructive sleep apnea (adult) (pediatric): Secondary | ICD-10-CM | POA: Diagnosis not present

## 2018-11-01 DIAGNOSIS — L97519 Non-pressure chronic ulcer of other part of right foot with unspecified severity: Secondary | ICD-10-CM | POA: Diagnosis not present

## 2018-11-01 DIAGNOSIS — L02415 Cutaneous abscess of right lower limb: Secondary | ICD-10-CM | POA: Diagnosis not present

## 2018-11-01 DIAGNOSIS — L02611 Cutaneous abscess of right foot: Secondary | ICD-10-CM | POA: Diagnosis not present

## 2018-11-01 DIAGNOSIS — B961 Klebsiella pneumoniae [K. pneumoniae] as the cause of diseases classified elsewhere: Secondary | ICD-10-CM | POA: Diagnosis not present

## 2018-11-01 DIAGNOSIS — Z89512 Acquired absence of left leg below knee: Secondary | ICD-10-CM | POA: Diagnosis not present

## 2018-11-01 DIAGNOSIS — M868X6 Other osteomyelitis, lower leg: Secondary | ICD-10-CM | POA: Diagnosis not present

## 2018-11-01 DIAGNOSIS — I10 Essential (primary) hypertension: Secondary | ICD-10-CM | POA: Diagnosis not present

## 2018-11-01 DIAGNOSIS — E1165 Type 2 diabetes mellitus with hyperglycemia: Secondary | ICD-10-CM | POA: Diagnosis not present

## 2018-11-01 DIAGNOSIS — M86171 Other acute osteomyelitis, right ankle and foot: Secondary | ICD-10-CM | POA: Diagnosis not present

## 2018-11-02 DIAGNOSIS — I10 Essential (primary) hypertension: Secondary | ICD-10-CM | POA: Diagnosis not present

## 2018-11-02 DIAGNOSIS — G4733 Obstructive sleep apnea (adult) (pediatric): Secondary | ICD-10-CM | POA: Diagnosis not present

## 2018-11-02 DIAGNOSIS — E1165 Type 2 diabetes mellitus with hyperglycemia: Secondary | ICD-10-CM | POA: Diagnosis not present

## 2018-11-02 DIAGNOSIS — M86171 Other acute osteomyelitis, right ankle and foot: Secondary | ICD-10-CM | POA: Diagnosis not present

## 2018-11-03 DIAGNOSIS — E1165 Type 2 diabetes mellitus with hyperglycemia: Secondary | ICD-10-CM | POA: Diagnosis not present

## 2018-11-03 DIAGNOSIS — G4733 Obstructive sleep apnea (adult) (pediatric): Secondary | ICD-10-CM | POA: Diagnosis not present

## 2018-11-03 DIAGNOSIS — M86171 Other acute osteomyelitis, right ankle and foot: Secondary | ICD-10-CM | POA: Diagnosis not present

## 2018-11-03 DIAGNOSIS — I10 Essential (primary) hypertension: Secondary | ICD-10-CM | POA: Diagnosis not present

## 2018-11-03 LAB — CULTURE, BLOOD (ROUTINE X 2)
CULTURE: NO GROWTH
Culture: NO GROWTH
Special Requests: ADEQUATE
Special Requests: ADEQUATE

## 2018-11-04 DIAGNOSIS — I10 Essential (primary) hypertension: Secondary | ICD-10-CM | POA: Diagnosis not present

## 2018-11-04 DIAGNOSIS — E1165 Type 2 diabetes mellitus with hyperglycemia: Secondary | ICD-10-CM | POA: Diagnosis not present

## 2018-11-04 DIAGNOSIS — G4733 Obstructive sleep apnea (adult) (pediatric): Secondary | ICD-10-CM | POA: Diagnosis not present

## 2018-11-04 DIAGNOSIS — M86171 Other acute osteomyelitis, right ankle and foot: Secondary | ICD-10-CM | POA: Diagnosis not present

## 2018-11-05 DIAGNOSIS — G4733 Obstructive sleep apnea (adult) (pediatric): Secondary | ICD-10-CM | POA: Diagnosis not present

## 2018-11-05 DIAGNOSIS — M86172 Other acute osteomyelitis, left ankle and foot: Secondary | ICD-10-CM | POA: Diagnosis not present

## 2018-11-05 DIAGNOSIS — M86171 Other acute osteomyelitis, right ankle and foot: Secondary | ICD-10-CM | POA: Diagnosis not present

## 2018-11-05 DIAGNOSIS — E1165 Type 2 diabetes mellitus with hyperglycemia: Secondary | ICD-10-CM | POA: Diagnosis not present

## 2018-11-05 DIAGNOSIS — J9620 Acute and chronic respiratory failure, unspecified whether with hypoxia or hypercapnia: Secondary | ICD-10-CM | POA: Diagnosis not present

## 2018-11-05 DIAGNOSIS — Z794 Long term (current) use of insulin: Secondary | ICD-10-CM | POA: Diagnosis not present

## 2018-11-05 DIAGNOSIS — E669 Obesity, unspecified: Secondary | ICD-10-CM | POA: Diagnosis not present

## 2018-11-05 DIAGNOSIS — J45909 Unspecified asthma, uncomplicated: Secondary | ICD-10-CM | POA: Diagnosis not present

## 2018-11-05 DIAGNOSIS — I10 Essential (primary) hypertension: Secondary | ICD-10-CM | POA: Diagnosis not present

## 2018-11-06 DIAGNOSIS — T8189XA Other complications of procedures, not elsewhere classified, initial encounter: Secondary | ICD-10-CM | POA: Diagnosis not present

## 2018-11-06 DIAGNOSIS — E1165 Type 2 diabetes mellitus with hyperglycemia: Secondary | ICD-10-CM | POA: Diagnosis not present

## 2018-11-06 DIAGNOSIS — M8600 Acute hematogenous osteomyelitis, unspecified site: Secondary | ICD-10-CM | POA: Diagnosis not present

## 2018-11-06 DIAGNOSIS — M86171 Other acute osteomyelitis, right ankle and foot: Secondary | ICD-10-CM | POA: Diagnosis not present

## 2018-11-06 DIAGNOSIS — B49 Unspecified mycosis: Secondary | ICD-10-CM | POA: Diagnosis not present

## 2018-11-06 DIAGNOSIS — Z794 Long term (current) use of insulin: Secondary | ICD-10-CM | POA: Diagnosis not present

## 2018-11-07 ENCOUNTER — Encounter: Payer: Self-pay | Admitting: Physical Therapy

## 2018-11-07 DIAGNOSIS — B49 Unspecified mycosis: Secondary | ICD-10-CM | POA: Diagnosis not present

## 2018-11-07 DIAGNOSIS — M86171 Other acute osteomyelitis, right ankle and foot: Secondary | ICD-10-CM | POA: Diagnosis not present

## 2018-11-07 DIAGNOSIS — M8600 Acute hematogenous osteomyelitis, unspecified site: Secondary | ICD-10-CM | POA: Diagnosis not present

## 2018-11-07 DIAGNOSIS — Z794 Long term (current) use of insulin: Secondary | ICD-10-CM | POA: Diagnosis not present

## 2018-11-07 DIAGNOSIS — E1165 Type 2 diabetes mellitus with hyperglycemia: Secondary | ICD-10-CM | POA: Diagnosis not present

## 2018-11-08 DIAGNOSIS — M86171 Other acute osteomyelitis, right ankle and foot: Secondary | ICD-10-CM | POA: Diagnosis not present

## 2018-11-08 DIAGNOSIS — M8600 Acute hematogenous osteomyelitis, unspecified site: Secondary | ICD-10-CM | POA: Diagnosis not present

## 2018-11-08 DIAGNOSIS — Z794 Long term (current) use of insulin: Secondary | ICD-10-CM | POA: Diagnosis not present

## 2018-11-08 DIAGNOSIS — E1165 Type 2 diabetes mellitus with hyperglycemia: Secondary | ICD-10-CM | POA: Diagnosis not present

## 2018-11-08 DIAGNOSIS — B49 Unspecified mycosis: Secondary | ICD-10-CM | POA: Diagnosis not present

## 2018-11-09 ENCOUNTER — Encounter: Payer: Self-pay | Admitting: Physical Therapy

## 2018-11-10 DIAGNOSIS — Z7951 Long term (current) use of inhaled steroids: Secondary | ICD-10-CM | POA: Diagnosis not present

## 2018-11-10 DIAGNOSIS — E1142 Type 2 diabetes mellitus with diabetic polyneuropathy: Secondary | ICD-10-CM | POA: Diagnosis not present

## 2018-11-10 DIAGNOSIS — G4733 Obstructive sleep apnea (adult) (pediatric): Secondary | ICD-10-CM | POA: Diagnosis not present

## 2018-11-10 DIAGNOSIS — Z89512 Acquired absence of left leg below knee: Secondary | ICD-10-CM | POA: Diagnosis not present

## 2018-11-10 DIAGNOSIS — Z8739 Personal history of other diseases of the musculoskeletal system and connective tissue: Secondary | ICD-10-CM | POA: Diagnosis not present

## 2018-11-10 DIAGNOSIS — J45909 Unspecified asthma, uncomplicated: Secondary | ICD-10-CM | POA: Diagnosis not present

## 2018-11-10 DIAGNOSIS — E669 Obesity, unspecified: Secondary | ICD-10-CM | POA: Diagnosis not present

## 2018-11-10 DIAGNOSIS — T8740 Infection of amputation stump, unspecified extremity: Secondary | ICD-10-CM | POA: Diagnosis not present

## 2018-11-10 DIAGNOSIS — M86171 Other acute osteomyelitis, right ankle and foot: Secondary | ICD-10-CM | POA: Diagnosis not present

## 2018-11-10 DIAGNOSIS — Z89422 Acquired absence of other left toe(s): Secondary | ICD-10-CM | POA: Diagnosis not present

## 2018-11-10 DIAGNOSIS — Z4781 Encounter for orthopedic aftercare following surgical amputation: Secondary | ICD-10-CM | POA: Diagnosis not present

## 2018-11-10 DIAGNOSIS — Z452 Encounter for adjustment and management of vascular access device: Secondary | ICD-10-CM | POA: Diagnosis not present

## 2018-11-10 DIAGNOSIS — Z4801 Encounter for change or removal of surgical wound dressing: Secondary | ICD-10-CM | POA: Diagnosis not present

## 2018-11-10 DIAGNOSIS — Z5181 Encounter for therapeutic drug level monitoring: Secondary | ICD-10-CM | POA: Diagnosis not present

## 2018-11-10 DIAGNOSIS — I10 Essential (primary) hypertension: Secondary | ICD-10-CM | POA: Diagnosis not present

## 2018-11-10 DIAGNOSIS — E785 Hyperlipidemia, unspecified: Secondary | ICD-10-CM | POA: Diagnosis not present

## 2018-11-10 DIAGNOSIS — Z794 Long term (current) use of insulin: Secondary | ICD-10-CM | POA: Diagnosis not present

## 2018-11-10 DIAGNOSIS — F329 Major depressive disorder, single episode, unspecified: Secondary | ICD-10-CM | POA: Diagnosis not present

## 2018-11-10 DIAGNOSIS — Z89411 Acquired absence of right great toe: Secondary | ICD-10-CM | POA: Diagnosis not present

## 2018-11-10 DIAGNOSIS — E1165 Type 2 diabetes mellitus with hyperglycemia: Secondary | ICD-10-CM | POA: Diagnosis not present

## 2018-11-10 DIAGNOSIS — Z79899 Other long term (current) drug therapy: Secondary | ICD-10-CM | POA: Diagnosis not present

## 2018-11-10 DIAGNOSIS — Z87891 Personal history of nicotine dependence: Secondary | ICD-10-CM | POA: Diagnosis not present

## 2018-11-10 DIAGNOSIS — M1990 Unspecified osteoarthritis, unspecified site: Secondary | ICD-10-CM | POA: Diagnosis not present

## 2018-11-10 DIAGNOSIS — M86172 Other acute osteomyelitis, left ankle and foot: Secondary | ICD-10-CM | POA: Diagnosis not present

## 2018-11-10 DIAGNOSIS — Z792 Long term (current) use of antibiotics: Secondary | ICD-10-CM | POA: Diagnosis not present

## 2018-11-10 DIAGNOSIS — J9620 Acute and chronic respiratory failure, unspecified whether with hypoxia or hypercapnia: Secondary | ICD-10-CM | POA: Diagnosis not present

## 2018-11-11 ENCOUNTER — Other Ambulatory Visit: Payer: Self-pay

## 2018-11-11 ENCOUNTER — Emergency Department (HOSPITAL_COMMUNITY)
Admission: EM | Admit: 2018-11-11 | Discharge: 2018-11-11 | Disposition: A | Discharge status: Home / Self Care | Payer: Medicaid Other | Attending: Emergency Medicine | Admitting: Emergency Medicine

## 2018-11-11 ENCOUNTER — Encounter (HOSPITAL_COMMUNITY): Payer: Self-pay

## 2018-11-11 ENCOUNTER — Emergency Department (HOSPITAL_COMMUNITY): Payer: Medicaid Other

## 2018-11-11 DIAGNOSIS — J449 Chronic obstructive pulmonary disease, unspecified: Secondary | ICD-10-CM | POA: Diagnosis not present

## 2018-11-11 DIAGNOSIS — E669 Obesity, unspecified: Secondary | ICD-10-CM | POA: Diagnosis not present

## 2018-11-11 DIAGNOSIS — Z79899 Other long term (current) drug therapy: Secondary | ICD-10-CM | POA: Insufficient documentation

## 2018-11-11 DIAGNOSIS — M79602 Pain in left arm: Secondary | ICD-10-CM | POA: Diagnosis not present

## 2018-11-11 DIAGNOSIS — L989 Disorder of the skin and subcutaneous tissue, unspecified: Secondary | ICD-10-CM | POA: Diagnosis not present

## 2018-11-11 DIAGNOSIS — Z451 Encounter for adjustment and management of infusion pump: Secondary | ICD-10-CM | POA: Diagnosis not present

## 2018-11-11 DIAGNOSIS — T300 Burn of unspecified body region, unspecified degree: Secondary | ICD-10-CM | POA: Diagnosis not present

## 2018-11-11 DIAGNOSIS — Z87891 Personal history of nicotine dependence: Secondary | ICD-10-CM | POA: Diagnosis not present

## 2018-11-11 DIAGNOSIS — T8740 Infection of amputation stump, unspecified extremity: Secondary | ICD-10-CM | POA: Diagnosis not present

## 2018-11-11 DIAGNOSIS — E119 Type 2 diabetes mellitus without complications: Secondary | ICD-10-CM | POA: Insufficient documentation

## 2018-11-11 DIAGNOSIS — Z7984 Long term (current) use of oral hypoglycemic drugs: Secondary | ICD-10-CM | POA: Insufficient documentation

## 2018-11-11 DIAGNOSIS — Z89512 Acquired absence of left leg below knee: Secondary | ICD-10-CM | POA: Diagnosis not present

## 2018-11-11 DIAGNOSIS — J9620 Acute and chronic respiratory failure, unspecified whether with hypoxia or hypercapnia: Secondary | ICD-10-CM | POA: Diagnosis not present

## 2018-11-11 DIAGNOSIS — Z89422 Acquired absence of other left toe(s): Secondary | ICD-10-CM | POA: Diagnosis not present

## 2018-11-11 DIAGNOSIS — M86171 Other acute osteomyelitis, right ankle and foot: Secondary | ICD-10-CM | POA: Diagnosis not present

## 2018-11-11 DIAGNOSIS — G4733 Obstructive sleep apnea (adult) (pediatric): Secondary | ICD-10-CM | POA: Diagnosis not present

## 2018-11-11 DIAGNOSIS — E1165 Type 2 diabetes mellitus with hyperglycemia: Secondary | ICD-10-CM | POA: Diagnosis not present

## 2018-11-11 DIAGNOSIS — M86172 Other acute osteomyelitis, left ankle and foot: Secondary | ICD-10-CM | POA: Diagnosis not present

## 2018-11-11 DIAGNOSIS — J45909 Unspecified asthma, uncomplicated: Secondary | ICD-10-CM | POA: Diagnosis not present

## 2018-11-11 NOTE — Progress Notes (Signed)
Assessed PICC in left upper arm, changed dressing small redden area around insertion site which appears  to be due to where the picc was placed and it pistons in and out when patient moves her arm. I repositioned the angle of the picc to help reduce this from happening. Otherwise site is WNL. Patient states that it no longer hurts and I also flushed it with 8ml of NS and no complaints from that either. Staff RN aware.

## 2018-11-11 NOTE — ED Notes (Signed)
Patient verbalizes understanding of discharge instructions. Opportunity for questioning and answers were provided. Armband removed by staff, pt discharged from ED.  

## 2018-11-11 NOTE — ED Triage Notes (Signed)
Pt arrives to ED from home with complaints of PICC line dislodgement since yesterday while her home health aid was changing her dressing. EMS reports pt was just admitted for a foot infection, wound vac in place. Pt's PICC line site is tender, some redness noted. Pt placed in position of comfort with bed locked and lowered, call bell in reach.

## 2018-11-11 NOTE — ED Notes (Signed)
Portable xray @bedside

## 2018-11-11 NOTE — ED Provider Notes (Signed)
[facility] EMERGENCY DEPARTMENT Provider Note   CSN: [id number] Arrival date & time: 11/11/18  1047     History   Chief Complaint Chief Complaint  Patient presents with  . Vascular Access Problem    HPI [name] is a 42 y.o. female.  The history is provided by the patient and medical records. No language interpreter was used.  Arm Injury   This is a new problem. The current episode started yesterday. The problem occurs constantly. The problem has not changed since onset.The pain is present in the left arm. The quality of the pain is described as sharp and aching. The pain is moderate. Pertinent negatives include no numbness and no tingling. She has tried nothing for the symptoms. The treatment provided no relief. There has been a history of trauma (picc line manipulation).    Past Medical History:  Diagnosis Date  . Acute osteomyelitis of ankle or foot, left ([place]) 01/31/2018  . Alveolar hypoventilation   . Anemia    not on iron pill  . Asthma   . Bipolar 2 disorder ([place])   . Carpal tunnel syndrome on right    recurrent  . Cellulitis 08/2010-08/2011  . Chronic pain   . COPD (chronic obstructive pulmonary disease) ([name])    Symbicort daily and Proventil as needed  . Costochondritis   . Depression   . Diabetes mellitus 2000   Type 2, Uncontrolled.Takes Lantus daily.Fasting blood sugar runs 150  . Dizziness    occasionally  . Drug-seeking behavior   . GERD (gastroesophageal reflux disease)    takes Pantoprazole and Zantac daily  . Headache    migraine-last one about a yr ago.Topamax daily  . HLD (hyperlipidemia)    takes Atorvastatin daily  . Hypertension    takes Lisinopril and Coreg daily  . Morbid obesity ([place])   . Muscle spasm    takes Flexeril as needed  . Nocturia   . Obstructive sleep apnea   . Peripheral neuropathy    takes Gabapentin daily  . Pneumonia    "walking" several yrs ago and as a baby  . Rectal fissure   . Restless leg     . Urinary frequency   . Varicose veins    Right medial thigh and Left leg     Patient Active Problem List   Diagnosis Date Noted  . Cellulitis of great toe of right foot 10/29/2018  . Osteomyelitis of great toe of right foot ([place]) 10/29/2018  . Vulvovaginitis 10/23/2018  . Encounter for orthopedic aftercare following surgical amputation 06/28/2018  . Uncontrolled type 2 diabetes mellitus with insulin therapy ([place]) 04/16/2018  . Amputation stump infection ([place]) 04/09/2018  . Allergic rhinitis 03/06/2018  . Unilateral complete BKA, left, sequela ([place])   . Decreased pedal pulses 06/10/2017  . Unilateral primary osteoarthritis, right knee 10/22/2016  . Primary osteoarthritis of first carpometacarpal joint of left hand 07/30/2016  . Diabetic neuropathy ([place]) 07/14/2016  . Syncope 02/25/2016  . De Quervain's tenosynovitis, bilateral 11/01/2015  . Vitamin D deficiency 09/05/2015  . Recurrent candidiasis of vagina 09/05/2015  . Varicose veins of leg with complications 41/28/7867  . Restless leg syndrome 10/17/2014  . Chronic sinusitis 07/18/2014  . Headache 07/15/2014  . Urge incontinence 10/15/2013  . Encounter for chronic pain management 06/30/2013  . HLD (hyperlipidemia) 11/19/2012  . Chest pain 06/27/2012  . Right carpal tunnel syndrome 09/01/2011  . Bilateral knee pain 09/01/2011  . Diabetes mellitus ([place]) 05/22/2008  . Morbid obesity ([place])  05/22/2008  . 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 Depression 05/22/2008  . Obstructive sleep apnea 05/22/2008  . Hypertension 05/22/2008  . Asthma 05/22/2008  . GERD 05/22/2008    Past Surgical History:  Procedure Laterality Date  . AMPUTATION Left 02/01/2018   Procedure: LEFT FOURTH AND 5TH TOE RAY AMPUTATION;  Surgeon: [name], MD;  Location: [facility];  Service: Orthopedics;  Laterality: Left;  . AMPUTATION Left 03/03/2018   Procedure: LEFT BELOW KNEE AMPUTATION;  Surgeon: [name], MD;  Location: [facility];   Service: Orthopedics;  Laterality: Left;  . CARPAL TUNNEL RELEASE Bilateral   . CESAREAN SECTION    . KNEE ARTHROSCOPY Right 07/17/2010  . LEFT HEART CATHETERIZATION WITH CORONARY ANGIOGRAM N/A 07/27/2012   Procedure: LEFT HEART CATHETERIZATION WITH CORONARY ANGIOGRAM;  Surgeon: Sherren Mocha, MD;  Location: [facility] CATH LAB;  Service: Cardiovascular;  Laterality: N/A;  . left knee surgery     screws she thinks  . MASS EXCISION N/A 06/29/2013   Procedure:  WIDE LOCAL EXCISION OF POSTERIOR NECK ABSCESS;  Surgeon: Ralene Ok, MD;  Location: [facility];  Service: General;  Laterality: N/A;     OB History    Gravida  2   Para  1   Term      Preterm      AB  1   Living  1     SAB  1   TAB      Ectopic      Multiple      Live Births               Home Medications    Prior to Admission medications   Medication Sig Start Date End Date Taking? Authorizing Provider  ACCU-CHEK SOFTCLIX LANCETS lancets Use as instructed to check blood sugar 3 times per day 03/16/18   Angiulli, Lavon Paganini, PA-C  albuterol (PROVENTIL HFA;VENTOLIN HFA) 108 (90 Base) MCG/ACT inhaler Inhale 1-2 puffs into the lungs every 6 (six) hours as needed for wheezing or shortness of breath. 01/17/18   [name], MD  atorvastatin (LIPITOR) 20 MG tablet Take 1 tablet (20 mg total) by mouth daily. 03/16/18   Angiulli, Lavon Paganini, PA-C  Blood Glucose Monitoring Suppl (ACCU-CHEK NANO SMARTVIEW) w/Device KIT Use to check blood sugar 3 times daily 03/16/18   Angiulli, Lavon Paganini, PA-C  budesonide-formoterol (SYMBICORT) 160-4.5 MCG/ACT inhaler inhale 2 PUFFS into THE lungs 2 TIMES DAILY Patient taking differently: Inhale 2 puffs into the lungs 2 (two) times daily.  10/10/18   [name], MD  cetirizine (ZYRTEC) 10 MG tablet Take 1 tablet (10 mg total) by mouth daily. Patient taking differently: Take 10 mg by mouth daily as needed for allergies.  02/28/18   [name], MD  Cholecalciferol 1000 units tablet  Take 1 tablet (1,000 Units total) by mouth daily. 04/05/17   [name], MD  ciprofloxacin (CIPRO) 750 MG tablet Take 1 tablet (750 mg total) by mouth 2 (two) times daily for 14 days. 10/29/18 11/12/18  Sela Hilding, MD  clindamycin (CLEOCIN) 150 MG capsule Take 3 capsules (450 mg total) by mouth 3 (three) times daily for 14 days. 10/29/18 11/12/18  Sela Hilding, MD  gabapentin (NEURONTIN) 600 MG tablet Take 2 tablets (1,200 mg total) by mouth 3 (three) times daily. 06/20/18   [name], MD  gentamicin cream (GARAMYCIN) 0.1 % Apply 1 application topically 3 (three) times daily. 09/11/18   Edrick Kins, DPM  glucose  blood (ACCU-CHEK GUIDE) test strip USE T0 CHECK BLOOD SUGAR 3 TIMES DAILY 03/16/18   Angiulli, Lavon Paganini, PA-C  HYDROcodone-acetaminophen (NORCO) 10-325 MG tablet Take 1 tablet by mouth every 8 (eight) hours as needed. Patient taking differently: Take 1 tablet by mouth every 8 (eight) hours as needed for moderate pain.  10/17/18 11/16/18  [name], MD  insulin aspart (NOVOLOG) 100 UNIT/ML FlexPen Inject 6 Units into the skin 3 (three) times daily with meals. 03/16/18   Angiulli, Lavon Paganini, PA-C  insulin degludec (TRESIBA FLEXTOUCH) 100 UNIT/ML SOPN FlexTouch Pen Inject 85 Units into the skin daily.    [provider]  lisinopril (PRINIVIL,ZESTRIL) 2.5 MG tablet Take 2.5 mg by mouth daily.    [provider]  metFORMIN (GLUCOPHAGE-XR) 500 MG 24 hr tablet Take 500 mg by mouth daily with breakfast.  04/26/18 04/26/19  [provider]  nystatin (MYCOSTATIN/NYSTOP) powder Apply topically 4 (four) times daily. Patient taking differently: Apply 1 g topically 4 (four) times daily.  06/20/18   [name], MD  nystatin cream (MYCOSTATIN) Apply 1 application topically 2 (two) times daily. 06/20/18   [name], MD  pantoprazole (PROTONIX) 40 MG tablet Take 1 tablet (40 mg total) by mouth daily. 03/16/18   Angiulli, Lavon Paganini, PA-C    Family History Family History  Problem Relation Age of Onset  . Diabetes Mother   . Hyperlipidemia Mother   . Depression Mother   . Varicose Veins Mother   . Heart attack Paternal Uncle   . Heart disease Paternal Grandmother   . Heart attack Paternal Grandmother   . Heart attack Paternal Grandfather   . Heart disease Paternal Grandfather   . Heart attack Father   . Cancer Maternal Grandmother        COLON  . Hypertension Maternal Grandmother   . Hyperlipidemia Maternal Grandmother   . Diabetes Maternal Grandmother   . Other Maternal Grandfather        GUN SHOT    Social History Social History   Tobacco Use  . Smoking status: Former Smoker    Packs/day: 0.30    Years: 0.30    Pack years: 0.09    Types: Cigarettes    Last attempt to quit: 12/06/1993    Years since quitting: 24.9  . Smokeless tobacco: Never Used  Substance Use Topics  . Alcohol use: No    Alcohol/week: 0.0 standard drinks  . Drug use: Yes    Comment: OD attempts on home meds       Allergies   Kiwi extract; Trental [pentoxifylline]; and Nubain [nalbuphine hcl]   Review of Systems Review of Systems  Constitutional: Negative for chills, diaphoresis, fatigue and fever.  HENT: Negative for congestion.   Respiratory: Negative for cough, chest tightness, shortness of breath and wheezing.   Cardiovascular: Negative for chest pain, palpitations and leg swelling.  Gastrointestinal: Negative for constipation, diarrhea, nausea and vomiting.  Genitourinary: Negative for flank pain.  Musculoskeletal: Negative for back pain, neck pain and neck stiffness.  Skin: Positive for color change and wound.  Neurological: Negative for tingling, numbness and headaches.  Psychiatric/Behavioral: Negative for agitation.  All other systems reviewed and are negative.    Physical Exam Updated Vital Signs Ht '5\' 2"'  (1.575 m)   Wt (!) 139.7 kg   BMI 56.33 kg/m   Physical Exam  Constitutional: She is  oriented to person, place, and time. She appears well-developed and well-nourished. No distress.  HENT:  Head:  Normocephalic.  Eyes: Pupils are equal, round, and reactive to light.  Neck: Normal range of motion.  Cardiovascular: Normal rate and intact distal pulses.  No murmur heard. Pulmonary/Chest: Effort normal and breath sounds normal. No respiratory distress. She has no wheezes. She has no rales. She exhibits no tenderness.  Abdominal: There is no tenderness.  Musculoskeletal: Normal range of motion. She exhibits tenderness. She exhibits no edema or deformity.       Left upper arm: She exhibits tenderness. She exhibits no swelling, no edema, no deformity and no laceration.       Arms: Neurological: She is alert and oriented to person, place, and time. No sensory deficit. She exhibits normal muscle tone.  Skin: Skin is warm. Capillary refill takes less than 2 seconds. She is not diaphoretic. There is erythema.  Psychiatric: She has a normal mood and affect.  Nursing note and vitals reviewed.    ED Treatments / Results  Labs (all labs ordered are listed, but only abnormal results are displayed) Labs Reviewed - No data to display  EKG None  Radiology Dg Chest Portable 1 View  Result Date: 11/11/2018 CLINICAL DATA:  PICC line partially pulled out. Evaluate integrity of the line. EXAM: PORTABLE CHEST 1 VIEW COMPARISON:  None. FINDINGS: Left upper extremity PICC line with the tip at the cavoatrial junction. Visualized portions of the PICC line are intact. Stable cardiomediastinal silhouette. Normal pulmonary vascularity. Low lung volumes. No focal consolidation, pleural effusion, or pneumothorax. No acute osseous abnormality. IMPRESSION: 1. Intact left upper extremity PICC line with tip at the cavoatrial junction. 2. Low lung volumes.  No active disease. Electronically Signed   By: Titus Dubin M.D.   On: 11/11/2018 11:50   Dg Humerus Left  Result Date: 11/11/2018 CLINICAL DATA:   Left upper extremity PICC line partially pulled out. Evaluate line integrity. EXAM: LEFT HUMERUS - 2+ VIEW COMPARISON:  None. FINDINGS: Left upper extremity PICC line appears intact. Apparent loop within the proximal catheter just beyond the hub on the lateral view is secondary to overlap, as there is no loop on the frontal view. No acute fracture or dislocation. No focal bone lesion. Soft tissues are unremarkable. IMPRESSION: Intact PICC line. Electronically Signed   By: Titus Dubin M.D.   On: 11/11/2018 11:55    Procedures Procedures (including critical care time)  Medications Ordered in ED Medications - No data to display   Initial Impression / Assessment and Plan / ED Course  I have reviewed the triage vital signs and the nursing notes.  Pertinent labs & imaging results that were available during my care of the patient were reviewed by me and considered in my medical decision making (see chart for details).     [name] is a 42 y.o. female with a past medical history significant for diabetes, hypertension, lipidemia, COPD, GERD, and right great toe osteomyelitis status post toe amputation currently on IV antibiotics with PICC line and wound VAC who presents for PICC line problem.  Patient reports that yesterday, her home health aide was changing her dressing and reports that the PICC line got partially pulled out.  Patient reports that they try to push it back in but was unsuccessful.  She reports that she then had burning in her upper arm when the infusion was performed and then when she flushed it with heparin it severely burned.  She is reporting bruising redness and pain in her left upper arm at the site of the PICC line insertion.  She has no pain in the lower part of the arm, weakness, numbness, or tingling.  She denies any chest pain, palpitations, shortness of breath, fevers, or chills.  She reports the wound VAC is working properly and the wound looked "great" when it was changed  yesterday.  Patient has no concern about her leg or the infection, she is most concerned about the PICC line.  On exam, lungs are clear and chest is nontender.  Left upper arm is tender at the site of the PICC line.  The PICC line appears to have pulled out approximately 1 cm.  There is tenderness around the insertion site with some bruising and erythema.  No drainage is seen.  Symmetric grip strength, radial pulse, and sensation.  No other tenderness present.  Exam otherwise unremarkable.  Patient's right toe was bandaged with a wound VAC with no significant pain.  Clinically I am concerned that the PICC line may have been injured with either the pulling out or the reinsertion attempts.  Patient will have x-ray of the chest and the upper arm to look for fractures or displacement.  Due to the pain when she tried to infuse, anticipate we will need to speak with IR for PICC line replacement so the patient can continue her home antifungal regimen.  Chart review shows that patient is on 4 weeks of oral Augmentin and 4 weeks of IV antifungal.  Anticipate reassessment after x-rays.  1:08 PM X-ray show no discontinuity or large fracture of the line.  Due to patient's report that the burning and pain happens after it is flushed or used, I am still concerned about an occult injury to the line.  As such, it will need to be replaced.  Replace PICC line order was placed and I spoke with the team.  They will come to replace the patient's line.  After placement, anticipate discharge home to continue outpatient management.  3:18 PM PICC line team came to assess the patient and were able to change the dressing and manipulated so it was no longer hurting.  They flushed it without any discomfort or evidence of extravasation.  Due to resolution of patient's problem and normal flushing, I do not feel it needs to be fully replaced.  This was felt to be reasonable plan after discussion with patient.  Patient will  continue infusions per home protocol and will follow-up with her infectious disease and outpatient team.  Patient received return precautions and had no complaints or concerns.  Patient discharged in good condition.  Final Clinical Impressions(s) / ED Diagnoses   Final diagnoses:  Left arm pain    ED Discharge Orders    None      Clinical Impression: 1. Left arm pain     Disposition: Discharge  Condition: Good  I have discussed the results, Dx and Tx plan with the pt(& family if present). He/she/they expressed understanding and agree(s) with the plan. Discharge instructions discussed at great length. Strict return precautions discussed and pt &/or family have verbalized understanding of the instructions. No further questions at time of discharge.    New Prescriptions   No medications on file    Follow Up: [name], MD [place] [address] [phone]     [facility] [address] [id number] mc [place]        Aleathea Pugmire, Gwenyth Allegra, MD 11/11/18 [phone]

## 2018-11-11 NOTE — Discharge Instructions (Signed)
Your work-up today did not show a problem with the PICC line on imaging and after the PICC line team came to assess you, they were able to reposition it so as to in your discomfort and allowed to flush normally.  We did not feel full replacement was needed after it was managed by the PICC line team.  Please follow-up and continue your outpatient infusion plan.  If any symptoms change or worsen, please return to the nearest emergency department.

## 2018-11-11 NOTE — ED Notes (Signed)
Pt assisted to bedside commode for second BM.

## 2018-11-12 DIAGNOSIS — Z89512 Acquired absence of left leg below knee: Secondary | ICD-10-CM | POA: Diagnosis not present

## 2018-11-12 DIAGNOSIS — M86171 Other acute osteomyelitis, right ankle and foot: Secondary | ICD-10-CM | POA: Diagnosis not present

## 2018-11-12 DIAGNOSIS — E669 Obesity, unspecified: Secondary | ICD-10-CM | POA: Diagnosis not present

## 2018-11-12 DIAGNOSIS — J9620 Acute and chronic respiratory failure, unspecified whether with hypoxia or hypercapnia: Secondary | ICD-10-CM | POA: Diagnosis not present

## 2018-11-12 DIAGNOSIS — Z89422 Acquired absence of other left toe(s): Secondary | ICD-10-CM | POA: Diagnosis not present

## 2018-11-12 DIAGNOSIS — J45909 Unspecified asthma, uncomplicated: Secondary | ICD-10-CM | POA: Diagnosis not present

## 2018-11-12 DIAGNOSIS — T8740 Infection of amputation stump, unspecified extremity: Secondary | ICD-10-CM | POA: Diagnosis not present

## 2018-11-12 DIAGNOSIS — G4733 Obstructive sleep apnea (adult) (pediatric): Secondary | ICD-10-CM | POA: Diagnosis not present

## 2018-11-12 DIAGNOSIS — M86172 Other acute osteomyelitis, left ankle and foot: Secondary | ICD-10-CM | POA: Diagnosis not present

## 2018-11-13 DIAGNOSIS — Z8739 Personal history of other diseases of the musculoskeletal system and connective tissue: Secondary | ICD-10-CM | POA: Diagnosis not present

## 2018-11-13 DIAGNOSIS — Z792 Long term (current) use of antibiotics: Secondary | ICD-10-CM | POA: Diagnosis not present

## 2018-11-13 DIAGNOSIS — Z89422 Acquired absence of other left toe(s): Secondary | ICD-10-CM | POA: Diagnosis not present

## 2018-11-13 DIAGNOSIS — M86172 Other acute osteomyelitis, left ankle and foot: Secondary | ICD-10-CM | POA: Diagnosis not present

## 2018-11-13 DIAGNOSIS — Z794 Long term (current) use of insulin: Secondary | ICD-10-CM | POA: Diagnosis not present

## 2018-11-13 DIAGNOSIS — Z87891 Personal history of nicotine dependence: Secondary | ICD-10-CM | POA: Diagnosis not present

## 2018-11-13 DIAGNOSIS — Z89411 Acquired absence of right great toe: Secondary | ICD-10-CM | POA: Diagnosis not present

## 2018-11-13 DIAGNOSIS — Z6841 Body Mass Index (BMI) 40.0 and over, adult: Secondary | ICD-10-CM | POA: Diagnosis not present

## 2018-11-13 DIAGNOSIS — E1142 Type 2 diabetes mellitus with diabetic polyneuropathy: Secondary | ICD-10-CM | POA: Diagnosis not present

## 2018-11-13 DIAGNOSIS — J45909 Unspecified asthma, uncomplicated: Secondary | ICD-10-CM | POA: Diagnosis not present

## 2018-11-13 DIAGNOSIS — F329 Major depressive disorder, single episode, unspecified: Secondary | ICD-10-CM | POA: Diagnosis not present

## 2018-11-13 DIAGNOSIS — E1165 Type 2 diabetes mellitus with hyperglycemia: Secondary | ICD-10-CM | POA: Diagnosis not present

## 2018-11-13 DIAGNOSIS — Z4801 Encounter for change or removal of surgical wound dressing: Secondary | ICD-10-CM | POA: Diagnosis not present

## 2018-11-13 DIAGNOSIS — T8740 Infection of amputation stump, unspecified extremity: Secondary | ICD-10-CM | POA: Diagnosis not present

## 2018-11-13 DIAGNOSIS — Z89512 Acquired absence of left leg below knee: Secondary | ICD-10-CM | POA: Diagnosis not present

## 2018-11-13 DIAGNOSIS — M2550 Pain in unspecified joint: Secondary | ICD-10-CM | POA: Diagnosis not present

## 2018-11-13 DIAGNOSIS — Z4781 Encounter for orthopedic aftercare following surgical amputation: Secondary | ICD-10-CM | POA: Diagnosis not present

## 2018-11-13 DIAGNOSIS — Z7951 Long term (current) use of inhaled steroids: Secondary | ICD-10-CM | POA: Diagnosis not present

## 2018-11-13 DIAGNOSIS — Z452 Encounter for adjustment and management of vascular access device: Secondary | ICD-10-CM | POA: Diagnosis not present

## 2018-11-13 DIAGNOSIS — I10 Essential (primary) hypertension: Secondary | ICD-10-CM | POA: Diagnosis not present

## 2018-11-13 DIAGNOSIS — M86171 Other acute osteomyelitis, right ankle and foot: Secondary | ICD-10-CM | POA: Diagnosis not present

## 2018-11-13 DIAGNOSIS — E669 Obesity, unspecified: Secondary | ICD-10-CM | POA: Diagnosis not present

## 2018-11-13 DIAGNOSIS — E785 Hyperlipidemia, unspecified: Secondary | ICD-10-CM | POA: Diagnosis not present

## 2018-11-13 DIAGNOSIS — M1990 Unspecified osteoarthritis, unspecified site: Secondary | ICD-10-CM | POA: Diagnosis not present

## 2018-11-13 DIAGNOSIS — Z79899 Other long term (current) drug therapy: Secondary | ICD-10-CM | POA: Diagnosis not present

## 2018-11-13 DIAGNOSIS — J9620 Acute and chronic respiratory failure, unspecified whether with hypoxia or hypercapnia: Secondary | ICD-10-CM | POA: Diagnosis not present

## 2018-11-13 DIAGNOSIS — Z5181 Encounter for therapeutic drug level monitoring: Secondary | ICD-10-CM | POA: Diagnosis not present

## 2018-11-13 DIAGNOSIS — G4733 Obstructive sleep apnea (adult) (pediatric): Secondary | ICD-10-CM | POA: Diagnosis not present

## 2018-11-14 ENCOUNTER — Encounter: Payer: Self-pay | Admitting: Physical Therapy

## 2018-11-14 DIAGNOSIS — G4733 Obstructive sleep apnea (adult) (pediatric): Secondary | ICD-10-CM | POA: Diagnosis not present

## 2018-11-14 DIAGNOSIS — Z89422 Acquired absence of other left toe(s): Secondary | ICD-10-CM | POA: Diagnosis not present

## 2018-11-14 DIAGNOSIS — J45909 Unspecified asthma, uncomplicated: Secondary | ICD-10-CM | POA: Diagnosis not present

## 2018-11-14 DIAGNOSIS — Z89512 Acquired absence of left leg below knee: Secondary | ICD-10-CM | POA: Diagnosis not present

## 2018-11-14 DIAGNOSIS — M86171 Other acute osteomyelitis, right ankle and foot: Secondary | ICD-10-CM | POA: Diagnosis not present

## 2018-11-14 DIAGNOSIS — M86172 Other acute osteomyelitis, left ankle and foot: Secondary | ICD-10-CM | POA: Diagnosis not present

## 2018-11-14 DIAGNOSIS — J9620 Acute and chronic respiratory failure, unspecified whether with hypoxia or hypercapnia: Secondary | ICD-10-CM | POA: Diagnosis not present

## 2018-11-14 DIAGNOSIS — E669 Obesity, unspecified: Secondary | ICD-10-CM | POA: Diagnosis not present

## 2018-11-14 DIAGNOSIS — T8740 Infection of amputation stump, unspecified extremity: Secondary | ICD-10-CM | POA: Diagnosis not present

## 2018-11-14 DIAGNOSIS — M2550 Pain in unspecified joint: Secondary | ICD-10-CM | POA: Diagnosis not present

## 2018-11-14 DIAGNOSIS — Z6841 Body Mass Index (BMI) 40.0 and over, adult: Secondary | ICD-10-CM | POA: Diagnosis not present

## 2018-11-15 ENCOUNTER — Inpatient Hospital Stay: Payer: Medicaid Other | Admitting: Family Medicine

## 2018-11-15 DIAGNOSIS — T8740 Infection of amputation stump, unspecified extremity: Secondary | ICD-10-CM | POA: Diagnosis not present

## 2018-11-15 DIAGNOSIS — Z89512 Acquired absence of left leg below knee: Secondary | ICD-10-CM | POA: Diagnosis not present

## 2018-11-15 DIAGNOSIS — E1165 Type 2 diabetes mellitus with hyperglycemia: Secondary | ICD-10-CM | POA: Diagnosis not present

## 2018-11-15 DIAGNOSIS — M2550 Pain in unspecified joint: Secondary | ICD-10-CM | POA: Diagnosis not present

## 2018-11-15 DIAGNOSIS — J45909 Unspecified asthma, uncomplicated: Secondary | ICD-10-CM | POA: Diagnosis not present

## 2018-11-15 DIAGNOSIS — E669 Obesity, unspecified: Secondary | ICD-10-CM | POA: Diagnosis not present

## 2018-11-15 DIAGNOSIS — Z6841 Body Mass Index (BMI) 40.0 and over, adult: Secondary | ICD-10-CM | POA: Diagnosis not present

## 2018-11-15 DIAGNOSIS — M86172 Other acute osteomyelitis, left ankle and foot: Secondary | ICD-10-CM | POA: Diagnosis not present

## 2018-11-15 DIAGNOSIS — Z89422 Acquired absence of other left toe(s): Secondary | ICD-10-CM | POA: Diagnosis not present

## 2018-11-15 DIAGNOSIS — M86171 Other acute osteomyelitis, right ankle and foot: Secondary | ICD-10-CM | POA: Diagnosis not present

## 2018-11-15 DIAGNOSIS — Z794 Long term (current) use of insulin: Secondary | ICD-10-CM | POA: Diagnosis not present

## 2018-11-15 DIAGNOSIS — G4733 Obstructive sleep apnea (adult) (pediatric): Secondary | ICD-10-CM | POA: Diagnosis not present

## 2018-11-15 DIAGNOSIS — L97512 Non-pressure chronic ulcer of other part of right foot with fat layer exposed: Secondary | ICD-10-CM | POA: Diagnosis not present

## 2018-11-15 DIAGNOSIS — E11621 Type 2 diabetes mellitus with foot ulcer: Secondary | ICD-10-CM | POA: Diagnosis not present

## 2018-11-15 DIAGNOSIS — J9620 Acute and chronic respiratory failure, unspecified whether with hypoxia or hypercapnia: Secondary | ICD-10-CM | POA: Diagnosis not present

## 2018-11-16 ENCOUNTER — Encounter: Payer: Self-pay | Admitting: Physical Therapy

## 2018-11-16 DIAGNOSIS — Z89512 Acquired absence of left leg below knee: Secondary | ICD-10-CM | POA: Diagnosis not present

## 2018-11-16 DIAGNOSIS — M86172 Other acute osteomyelitis, left ankle and foot: Secondary | ICD-10-CM | POA: Diagnosis not present

## 2018-11-16 DIAGNOSIS — J9620 Acute and chronic respiratory failure, unspecified whether with hypoxia or hypercapnia: Secondary | ICD-10-CM | POA: Diagnosis not present

## 2018-11-16 DIAGNOSIS — Z89422 Acquired absence of other left toe(s): Secondary | ICD-10-CM | POA: Diagnosis not present

## 2018-11-16 DIAGNOSIS — G4733 Obstructive sleep apnea (adult) (pediatric): Secondary | ICD-10-CM | POA: Diagnosis not present

## 2018-11-16 DIAGNOSIS — T8740 Infection of amputation stump, unspecified extremity: Secondary | ICD-10-CM | POA: Diagnosis not present

## 2018-11-16 DIAGNOSIS — M86171 Other acute osteomyelitis, right ankle and foot: Secondary | ICD-10-CM | POA: Diagnosis not present

## 2018-11-16 DIAGNOSIS — J45909 Unspecified asthma, uncomplicated: Secondary | ICD-10-CM | POA: Diagnosis not present

## 2018-11-16 DIAGNOSIS — Z6841 Body Mass Index (BMI) 40.0 and over, adult: Secondary | ICD-10-CM | POA: Diagnosis not present

## 2018-11-16 DIAGNOSIS — E669 Obesity, unspecified: Secondary | ICD-10-CM | POA: Diagnosis not present

## 2018-11-16 DIAGNOSIS — M2550 Pain in unspecified joint: Secondary | ICD-10-CM | POA: Diagnosis not present

## 2018-11-17 DIAGNOSIS — M86172 Other acute osteomyelitis, left ankle and foot: Secondary | ICD-10-CM | POA: Diagnosis not present

## 2018-11-17 DIAGNOSIS — Z79899 Other long term (current) drug therapy: Secondary | ICD-10-CM | POA: Diagnosis not present

## 2018-11-17 DIAGNOSIS — Z89422 Acquired absence of other left toe(s): Secondary | ICD-10-CM | POA: Diagnosis not present

## 2018-11-17 DIAGNOSIS — M2550 Pain in unspecified joint: Secondary | ICD-10-CM | POA: Diagnosis not present

## 2018-11-17 DIAGNOSIS — Z8739 Personal history of other diseases of the musculoskeletal system and connective tissue: Secondary | ICD-10-CM | POA: Diagnosis not present

## 2018-11-17 DIAGNOSIS — Z7951 Long term (current) use of inhaled steroids: Secondary | ICD-10-CM | POA: Diagnosis not present

## 2018-11-17 DIAGNOSIS — E1142 Type 2 diabetes mellitus with diabetic polyneuropathy: Secondary | ICD-10-CM | POA: Diagnosis not present

## 2018-11-17 DIAGNOSIS — M1990 Unspecified osteoarthritis, unspecified site: Secondary | ICD-10-CM | POA: Diagnosis not present

## 2018-11-17 DIAGNOSIS — Z452 Encounter for adjustment and management of vascular access device: Secondary | ICD-10-CM | POA: Diagnosis not present

## 2018-11-17 DIAGNOSIS — Z6841 Body Mass Index (BMI) 40.0 and over, adult: Secondary | ICD-10-CM | POA: Diagnosis not present

## 2018-11-17 DIAGNOSIS — E669 Obesity, unspecified: Secondary | ICD-10-CM | POA: Diagnosis not present

## 2018-11-17 DIAGNOSIS — Z794 Long term (current) use of insulin: Secondary | ICD-10-CM | POA: Diagnosis not present

## 2018-11-17 DIAGNOSIS — I10 Essential (primary) hypertension: Secondary | ICD-10-CM | POA: Diagnosis not present

## 2018-11-17 DIAGNOSIS — J9620 Acute and chronic respiratory failure, unspecified whether with hypoxia or hypercapnia: Secondary | ICD-10-CM | POA: Diagnosis not present

## 2018-11-17 DIAGNOSIS — Z89512 Acquired absence of left leg below knee: Secondary | ICD-10-CM | POA: Diagnosis not present

## 2018-11-17 DIAGNOSIS — T8740 Infection of amputation stump, unspecified extremity: Secondary | ICD-10-CM | POA: Diagnosis not present

## 2018-11-17 DIAGNOSIS — Z87891 Personal history of nicotine dependence: Secondary | ICD-10-CM | POA: Diagnosis not present

## 2018-11-17 DIAGNOSIS — G4733 Obstructive sleep apnea (adult) (pediatric): Secondary | ICD-10-CM | POA: Diagnosis not present

## 2018-11-17 DIAGNOSIS — E1165 Type 2 diabetes mellitus with hyperglycemia: Secondary | ICD-10-CM | POA: Diagnosis not present

## 2018-11-17 DIAGNOSIS — Z89411 Acquired absence of right great toe: Secondary | ICD-10-CM | POA: Diagnosis not present

## 2018-11-17 DIAGNOSIS — Z792 Long term (current) use of antibiotics: Secondary | ICD-10-CM | POA: Diagnosis not present

## 2018-11-17 DIAGNOSIS — Z5181 Encounter for therapeutic drug level monitoring: Secondary | ICD-10-CM | POA: Diagnosis not present

## 2018-11-17 DIAGNOSIS — J45909 Unspecified asthma, uncomplicated: Secondary | ICD-10-CM | POA: Diagnosis not present

## 2018-11-17 DIAGNOSIS — Z4781 Encounter for orthopedic aftercare following surgical amputation: Secondary | ICD-10-CM | POA: Diagnosis not present

## 2018-11-17 DIAGNOSIS — Z4801 Encounter for change or removal of surgical wound dressing: Secondary | ICD-10-CM | POA: Diagnosis not present

## 2018-11-17 DIAGNOSIS — M86171 Other acute osteomyelitis, right ankle and foot: Secondary | ICD-10-CM | POA: Diagnosis not present

## 2018-11-17 DIAGNOSIS — E785 Hyperlipidemia, unspecified: Secondary | ICD-10-CM | POA: Diagnosis not present

## 2018-11-17 DIAGNOSIS — F329 Major depressive disorder, single episode, unspecified: Secondary | ICD-10-CM | POA: Diagnosis not present

## 2018-11-18 DIAGNOSIS — Z89512 Acquired absence of left leg below knee: Secondary | ICD-10-CM | POA: Diagnosis not present

## 2018-11-18 DIAGNOSIS — Z6841 Body Mass Index (BMI) 40.0 and over, adult: Secondary | ICD-10-CM | POA: Diagnosis not present

## 2018-11-18 DIAGNOSIS — M2550 Pain in unspecified joint: Secondary | ICD-10-CM | POA: Diagnosis not present

## 2018-11-18 DIAGNOSIS — Z89422 Acquired absence of other left toe(s): Secondary | ICD-10-CM | POA: Diagnosis not present

## 2018-11-18 DIAGNOSIS — M86171 Other acute osteomyelitis, right ankle and foot: Secondary | ICD-10-CM | POA: Diagnosis not present

## 2018-11-18 DIAGNOSIS — T8740 Infection of amputation stump, unspecified extremity: Secondary | ICD-10-CM | POA: Diagnosis not present

## 2018-11-18 DIAGNOSIS — E669 Obesity, unspecified: Secondary | ICD-10-CM | POA: Diagnosis not present

## 2018-11-18 DIAGNOSIS — G4733 Obstructive sleep apnea (adult) (pediatric): Secondary | ICD-10-CM | POA: Diagnosis not present

## 2018-11-18 DIAGNOSIS — J45909 Unspecified asthma, uncomplicated: Secondary | ICD-10-CM | POA: Diagnosis not present

## 2018-11-18 DIAGNOSIS — J9620 Acute and chronic respiratory failure, unspecified whether with hypoxia or hypercapnia: Secondary | ICD-10-CM | POA: Diagnosis not present

## 2018-11-18 DIAGNOSIS — M86172 Other acute osteomyelitis, left ankle and foot: Secondary | ICD-10-CM | POA: Diagnosis not present

## 2018-11-19 DIAGNOSIS — G4733 Obstructive sleep apnea (adult) (pediatric): Secondary | ICD-10-CM | POA: Diagnosis not present

## 2018-11-19 DIAGNOSIS — J45909 Unspecified asthma, uncomplicated: Secondary | ICD-10-CM | POA: Diagnosis not present

## 2018-11-19 DIAGNOSIS — M86171 Other acute osteomyelitis, right ankle and foot: Secondary | ICD-10-CM | POA: Diagnosis not present

## 2018-11-19 DIAGNOSIS — Z89512 Acquired absence of left leg below knee: Secondary | ICD-10-CM | POA: Diagnosis not present

## 2018-11-19 DIAGNOSIS — M86172 Other acute osteomyelitis, left ankle and foot: Secondary | ICD-10-CM | POA: Diagnosis not present

## 2018-11-19 DIAGNOSIS — E669 Obesity, unspecified: Secondary | ICD-10-CM | POA: Diagnosis not present

## 2018-11-19 DIAGNOSIS — J9620 Acute and chronic respiratory failure, unspecified whether with hypoxia or hypercapnia: Secondary | ICD-10-CM | POA: Diagnosis not present

## 2018-11-19 DIAGNOSIS — T8740 Infection of amputation stump, unspecified extremity: Secondary | ICD-10-CM | POA: Diagnosis not present

## 2018-11-19 DIAGNOSIS — Z89422 Acquired absence of other left toe(s): Secondary | ICD-10-CM | POA: Diagnosis not present

## 2018-11-20 ENCOUNTER — Other Ambulatory Visit: Payer: Self-pay | Admitting: Family Medicine

## 2018-11-20 DIAGNOSIS — J45909 Unspecified asthma, uncomplicated: Secondary | ICD-10-CM | POA: Diagnosis not present

## 2018-11-20 DIAGNOSIS — Z89411 Acquired absence of right great toe: Secondary | ICD-10-CM | POA: Diagnosis not present

## 2018-11-20 DIAGNOSIS — E785 Hyperlipidemia, unspecified: Secondary | ICD-10-CM | POA: Diagnosis not present

## 2018-11-20 DIAGNOSIS — I10 Essential (primary) hypertension: Secondary | ICD-10-CM | POA: Diagnosis not present

## 2018-11-20 DIAGNOSIS — Z5181 Encounter for therapeutic drug level monitoring: Secondary | ICD-10-CM | POA: Diagnosis not present

## 2018-11-20 DIAGNOSIS — Z89422 Acquired absence of other left toe(s): Secondary | ICD-10-CM | POA: Diagnosis not present

## 2018-11-20 DIAGNOSIS — Z8739 Personal history of other diseases of the musculoskeletal system and connective tissue: Secondary | ICD-10-CM | POA: Diagnosis not present

## 2018-11-20 DIAGNOSIS — F329 Major depressive disorder, single episode, unspecified: Secondary | ICD-10-CM | POA: Diagnosis not present

## 2018-11-20 DIAGNOSIS — J9620 Acute and chronic respiratory failure, unspecified whether with hypoxia or hypercapnia: Secondary | ICD-10-CM | POA: Diagnosis not present

## 2018-11-20 DIAGNOSIS — E1140 Type 2 diabetes mellitus with diabetic neuropathy, unspecified: Secondary | ICD-10-CM | POA: Diagnosis not present

## 2018-11-20 DIAGNOSIS — T8740 Infection of amputation stump, unspecified extremity: Secondary | ICD-10-CM | POA: Diagnosis not present

## 2018-11-20 DIAGNOSIS — Z792 Long term (current) use of antibiotics: Secondary | ICD-10-CM | POA: Diagnosis not present

## 2018-11-20 DIAGNOSIS — G4733 Obstructive sleep apnea (adult) (pediatric): Secondary | ICD-10-CM | POA: Diagnosis not present

## 2018-11-20 DIAGNOSIS — Z7951 Long term (current) use of inhaled steroids: Secondary | ICD-10-CM | POA: Diagnosis not present

## 2018-11-20 DIAGNOSIS — Z452 Encounter for adjustment and management of vascular access device: Secondary | ICD-10-CM | POA: Diagnosis not present

## 2018-11-20 DIAGNOSIS — Z87891 Personal history of nicotine dependence: Secondary | ICD-10-CM | POA: Diagnosis not present

## 2018-11-20 DIAGNOSIS — Z79899 Other long term (current) drug therapy: Secondary | ICD-10-CM | POA: Diagnosis not present

## 2018-11-20 DIAGNOSIS — Z4781 Encounter for orthopedic aftercare following surgical amputation: Secondary | ICD-10-CM | POA: Diagnosis not present

## 2018-11-20 DIAGNOSIS — Z794 Long term (current) use of insulin: Secondary | ICD-10-CM | POA: Diagnosis not present

## 2018-11-20 DIAGNOSIS — Z89512 Acquired absence of left leg below knee: Secondary | ICD-10-CM | POA: Diagnosis not present

## 2018-11-20 DIAGNOSIS — Z4801 Encounter for change or removal of surgical wound dressing: Secondary | ICD-10-CM | POA: Diagnosis not present

## 2018-11-20 DIAGNOSIS — E669 Obesity, unspecified: Secondary | ICD-10-CM | POA: Diagnosis not present

## 2018-11-20 DIAGNOSIS — E1142 Type 2 diabetes mellitus with diabetic polyneuropathy: Secondary | ICD-10-CM | POA: Diagnosis not present

## 2018-11-20 DIAGNOSIS — E1165 Type 2 diabetes mellitus with hyperglycemia: Secondary | ICD-10-CM | POA: Diagnosis not present

## 2018-11-20 DIAGNOSIS — M1990 Unspecified osteoarthritis, unspecified site: Secondary | ICD-10-CM | POA: Diagnosis not present

## 2018-11-20 DIAGNOSIS — M86172 Other acute osteomyelitis, left ankle and foot: Secondary | ICD-10-CM | POA: Diagnosis not present

## 2018-11-20 DIAGNOSIS — M86171 Other acute osteomyelitis, right ankle and foot: Secondary | ICD-10-CM | POA: Diagnosis not present

## 2018-11-21 DIAGNOSIS — Z89422 Acquired absence of other left toe(s): Secondary | ICD-10-CM | POA: Diagnosis not present

## 2018-11-21 DIAGNOSIS — M86171 Other acute osteomyelitis, right ankle and foot: Secondary | ICD-10-CM | POA: Diagnosis not present

## 2018-11-21 DIAGNOSIS — J45909 Unspecified asthma, uncomplicated: Secondary | ICD-10-CM | POA: Diagnosis not present

## 2018-11-21 DIAGNOSIS — G4733 Obstructive sleep apnea (adult) (pediatric): Secondary | ICD-10-CM | POA: Diagnosis not present

## 2018-11-21 DIAGNOSIS — E1140 Type 2 diabetes mellitus with diabetic neuropathy, unspecified: Secondary | ICD-10-CM | POA: Diagnosis not present

## 2018-11-21 DIAGNOSIS — Z89512 Acquired absence of left leg below knee: Secondary | ICD-10-CM | POA: Diagnosis not present

## 2018-11-21 DIAGNOSIS — J9620 Acute and chronic respiratory failure, unspecified whether with hypoxia or hypercapnia: Secondary | ICD-10-CM | POA: Diagnosis not present

## 2018-11-21 DIAGNOSIS — T8740 Infection of amputation stump, unspecified extremity: Secondary | ICD-10-CM | POA: Diagnosis not present

## 2018-11-21 DIAGNOSIS — E669 Obesity, unspecified: Secondary | ICD-10-CM | POA: Diagnosis not present

## 2018-11-21 DIAGNOSIS — M86172 Other acute osteomyelitis, left ankle and foot: Secondary | ICD-10-CM | POA: Diagnosis not present

## 2018-11-22 DIAGNOSIS — Z89422 Acquired absence of other left toe(s): Secondary | ICD-10-CM | POA: Diagnosis not present

## 2018-11-22 DIAGNOSIS — Z5181 Encounter for therapeutic drug level monitoring: Secondary | ICD-10-CM | POA: Diagnosis not present

## 2018-11-22 DIAGNOSIS — Z452 Encounter for adjustment and management of vascular access device: Secondary | ICD-10-CM | POA: Diagnosis not present

## 2018-11-22 DIAGNOSIS — M1990 Unspecified osteoarthritis, unspecified site: Secondary | ICD-10-CM | POA: Diagnosis not present

## 2018-11-22 DIAGNOSIS — J45909 Unspecified asthma, uncomplicated: Secondary | ICD-10-CM | POA: Diagnosis not present

## 2018-11-22 DIAGNOSIS — Z89512 Acquired absence of left leg below knee: Secondary | ICD-10-CM | POA: Diagnosis not present

## 2018-11-22 DIAGNOSIS — E669 Obesity, unspecified: Secondary | ICD-10-CM | POA: Diagnosis not present

## 2018-11-22 DIAGNOSIS — Z4781 Encounter for orthopedic aftercare following surgical amputation: Secondary | ICD-10-CM | POA: Diagnosis not present

## 2018-11-22 DIAGNOSIS — E785 Hyperlipidemia, unspecified: Secondary | ICD-10-CM | POA: Diagnosis not present

## 2018-11-22 DIAGNOSIS — Z4801 Encounter for change or removal of surgical wound dressing: Secondary | ICD-10-CM | POA: Diagnosis not present

## 2018-11-22 DIAGNOSIS — E1165 Type 2 diabetes mellitus with hyperglycemia: Secondary | ICD-10-CM | POA: Diagnosis not present

## 2018-11-22 DIAGNOSIS — Z794 Long term (current) use of insulin: Secondary | ICD-10-CM | POA: Diagnosis not present

## 2018-11-22 DIAGNOSIS — Z87891 Personal history of nicotine dependence: Secondary | ICD-10-CM | POA: Diagnosis not present

## 2018-11-22 DIAGNOSIS — Z79899 Other long term (current) drug therapy: Secondary | ICD-10-CM | POA: Diagnosis not present

## 2018-11-22 DIAGNOSIS — Z792 Long term (current) use of antibiotics: Secondary | ICD-10-CM | POA: Diagnosis not present

## 2018-11-22 DIAGNOSIS — F329 Major depressive disorder, single episode, unspecified: Secondary | ICD-10-CM | POA: Diagnosis not present

## 2018-11-22 DIAGNOSIS — M86172 Other acute osteomyelitis, left ankle and foot: Secondary | ICD-10-CM | POA: Diagnosis not present

## 2018-11-22 DIAGNOSIS — M86171 Other acute osteomyelitis, right ankle and foot: Secondary | ICD-10-CM | POA: Diagnosis not present

## 2018-11-22 DIAGNOSIS — Z8739 Personal history of other diseases of the musculoskeletal system and connective tissue: Secondary | ICD-10-CM | POA: Diagnosis not present

## 2018-11-22 DIAGNOSIS — G4733 Obstructive sleep apnea (adult) (pediatric): Secondary | ICD-10-CM | POA: Diagnosis not present

## 2018-11-22 DIAGNOSIS — Z7951 Long term (current) use of inhaled steroids: Secondary | ICD-10-CM | POA: Diagnosis not present

## 2018-11-22 DIAGNOSIS — E1140 Type 2 diabetes mellitus with diabetic neuropathy, unspecified: Secondary | ICD-10-CM | POA: Diagnosis not present

## 2018-11-22 DIAGNOSIS — Z89411 Acquired absence of right great toe: Secondary | ICD-10-CM | POA: Diagnosis not present

## 2018-11-22 DIAGNOSIS — I10 Essential (primary) hypertension: Secondary | ICD-10-CM | POA: Diagnosis not present

## 2018-11-22 DIAGNOSIS — T8740 Infection of amputation stump, unspecified extremity: Secondary | ICD-10-CM | POA: Diagnosis not present

## 2018-11-22 DIAGNOSIS — J9620 Acute and chronic respiratory failure, unspecified whether with hypoxia or hypercapnia: Secondary | ICD-10-CM | POA: Diagnosis not present

## 2018-11-22 DIAGNOSIS — E1142 Type 2 diabetes mellitus with diabetic polyneuropathy: Secondary | ICD-10-CM | POA: Diagnosis not present

## 2018-11-23 ENCOUNTER — Encounter (HOSPITAL_COMMUNITY): Payer: Self-pay | Admitting: Emergency Medicine

## 2018-11-23 ENCOUNTER — Emergency Department (HOSPITAL_COMMUNITY): Payer: Medicaid Other

## 2018-11-23 ENCOUNTER — Emergency Department (HOSPITAL_COMMUNITY)
Admission: EM | Admit: 2018-11-23 | Discharge: 2018-11-23 | Disposition: A | Discharge status: Home / Self Care | Payer: Medicaid Other | Attending: Emergency Medicine | Admitting: Emergency Medicine

## 2018-11-23 DIAGNOSIS — S98911A Complete traumatic amputation of right foot, level unspecified, initial encounter: Secondary | ICD-10-CM | POA: Diagnosis not present

## 2018-11-23 DIAGNOSIS — E119 Type 2 diabetes mellitus without complications: Secondary | ICD-10-CM | POA: Insufficient documentation

## 2018-11-23 DIAGNOSIS — E785 Hyperlipidemia, unspecified: Secondary | ICD-10-CM | POA: Diagnosis not present

## 2018-11-23 DIAGNOSIS — J45909 Unspecified asthma, uncomplicated: Secondary | ICD-10-CM | POA: Diagnosis not present

## 2018-11-23 DIAGNOSIS — Z89512 Acquired absence of left leg below knee: Secondary | ICD-10-CM | POA: Diagnosis not present

## 2018-11-23 DIAGNOSIS — Z87891 Personal history of nicotine dependence: Secondary | ICD-10-CM | POA: Insufficient documentation

## 2018-11-23 DIAGNOSIS — M86172 Other acute osteomyelitis, left ankle and foot: Secondary | ICD-10-CM | POA: Diagnosis not present

## 2018-11-23 DIAGNOSIS — Z4781 Encounter for orthopedic aftercare following surgical amputation: Secondary | ICD-10-CM | POA: Diagnosis not present

## 2018-11-23 DIAGNOSIS — M86171 Other acute osteomyelitis, right ankle and foot: Secondary | ICD-10-CM | POA: Diagnosis not present

## 2018-11-23 DIAGNOSIS — Z5181 Encounter for therapeutic drug level monitoring: Secondary | ICD-10-CM | POA: Diagnosis not present

## 2018-11-23 DIAGNOSIS — Z79899 Other long term (current) drug therapy: Secondary | ICD-10-CM | POA: Diagnosis not present

## 2018-11-23 DIAGNOSIS — Z794 Long term (current) use of insulin: Secondary | ICD-10-CM | POA: Insufficient documentation

## 2018-11-23 DIAGNOSIS — F329 Major depressive disorder, single episode, unspecified: Secondary | ICD-10-CM | POA: Diagnosis not present

## 2018-11-23 DIAGNOSIS — M79671 Pain in right foot: Secondary | ICD-10-CM | POA: Diagnosis present

## 2018-11-23 DIAGNOSIS — G8918 Other acute postprocedural pain: Secondary | ICD-10-CM | POA: Diagnosis not present

## 2018-11-23 DIAGNOSIS — J449 Chronic obstructive pulmonary disease, unspecified: Secondary | ICD-10-CM | POA: Diagnosis not present

## 2018-11-23 DIAGNOSIS — Z89422 Acquired absence of other left toe(s): Secondary | ICD-10-CM | POA: Diagnosis not present

## 2018-11-23 DIAGNOSIS — M1990 Unspecified osteoarthritis, unspecified site: Secondary | ICD-10-CM | POA: Diagnosis not present

## 2018-11-23 DIAGNOSIS — Z452 Encounter for adjustment and management of vascular access device: Secondary | ICD-10-CM | POA: Diagnosis not present

## 2018-11-23 DIAGNOSIS — Z89411 Acquired absence of right great toe: Secondary | ICD-10-CM | POA: Diagnosis not present

## 2018-11-23 DIAGNOSIS — E1165 Type 2 diabetes mellitus with hyperglycemia: Secondary | ICD-10-CM | POA: Diagnosis not present

## 2018-11-23 DIAGNOSIS — M79674 Pain in right toe(s): Secondary | ICD-10-CM | POA: Diagnosis not present

## 2018-11-23 DIAGNOSIS — E1140 Type 2 diabetes mellitus with diabetic neuropathy, unspecified: Secondary | ICD-10-CM | POA: Diagnosis not present

## 2018-11-23 DIAGNOSIS — I10 Essential (primary) hypertension: Secondary | ICD-10-CM | POA: Insufficient documentation

## 2018-11-23 DIAGNOSIS — R52 Pain, unspecified: Secondary | ICD-10-CM | POA: Diagnosis not present

## 2018-11-23 DIAGNOSIS — J9620 Acute and chronic respiratory failure, unspecified whether with hypoxia or hypercapnia: Secondary | ICD-10-CM | POA: Diagnosis not present

## 2018-11-23 DIAGNOSIS — G4733 Obstructive sleep apnea (adult) (pediatric): Secondary | ICD-10-CM | POA: Diagnosis not present

## 2018-11-23 DIAGNOSIS — E669 Obesity, unspecified: Secondary | ICD-10-CM | POA: Diagnosis not present

## 2018-11-23 DIAGNOSIS — Z8739 Personal history of other diseases of the musculoskeletal system and connective tissue: Secondary | ICD-10-CM | POA: Diagnosis not present

## 2018-11-23 DIAGNOSIS — Z792 Long term (current) use of antibiotics: Secondary | ICD-10-CM | POA: Diagnosis not present

## 2018-11-23 DIAGNOSIS — Z7951 Long term (current) use of inhaled steroids: Secondary | ICD-10-CM | POA: Diagnosis not present

## 2018-11-23 DIAGNOSIS — E1142 Type 2 diabetes mellitus with diabetic polyneuropathy: Secondary | ICD-10-CM | POA: Diagnosis not present

## 2018-11-23 DIAGNOSIS — T8740 Infection of amputation stump, unspecified extremity: Secondary | ICD-10-CM | POA: Diagnosis not present

## 2018-11-23 DIAGNOSIS — Z4801 Encounter for change or removal of surgical wound dressing: Secondary | ICD-10-CM | POA: Diagnosis not present

## 2018-11-23 LAB — CBG MONITORING, ED: Glucose-Capillary: 107 mg/dL — ABNORMAL HIGH (ref 70–99)

## 2018-11-23 MED ORDER — HYDROMORPHONE HCL 1 MG/ML IJ SOLN
1.0000 mg | Freq: Once | INTRAMUSCULAR | Status: AC
  Administered 2018-11-23: 1 mg via INTRAMUSCULAR
  Filled 2018-11-23: qty 1

## 2018-11-23 NOTE — ED Notes (Signed)
Patient requested a taxi voucher, this RN spoke with Garment/textile technologist who advised we have no vouchers in the department at this time but patient could wait in the lobby until social work arrives in the morning to see if she could get a voucher then. Patient made aware of this and stated she would get her daughter to call her grandmother to see if she could give them a ride home.

## 2018-11-23 NOTE — ED Triage Notes (Signed)
Patient arrived with EMS from home reports right distal foot pain this evening unrelieved by Rx. pain medication , history of toe amputation at he same foot .

## 2018-11-23 NOTE — Discharge Instructions (Addendum)
Continue your antibiotics as well as your pain medication.  Follow-up with your foot surgeon and wound care nurse.  Return to the ED with fever, vomiting, intractable pain or any other concerns.

## 2018-11-23 NOTE — ED Notes (Signed)
Pt verbalized understanding of dc instructions, vss, wheelchair at bedside for patient to be transported to lobby once she is dressed.

## 2018-11-23 NOTE — ED Notes (Signed)
[facility]G 107 

## 2018-11-23 NOTE — ED Provider Notes (Signed)
[facility] EMERGENCY DEPARTMENT Provider Note   CSN: [id number] Arrival date & time: 11/23/18  0345     History   Chief Complaint Chief Complaint  Patient presents with  . Foot Pain    Right    HPI [name] is a 42 y.o. female.  Patient presents with pain at the site of her previous right great toe amputation.  She reports this pain became acutely worse after dressing change by her home care nurse yesterday.  She has been taking hydrocodone at home without relief.  Denies any trauma.  Patient underwent amputation at [facility] on November 27.  She had a wound VAC which has since been discontinued as well as PICC line for IV antifungals which has also been discontinued.  She continues to take p.o. antibiotics for chronic osteomyelitis until December 29.  She denies any fevers, chills, nausea or vomiting.  States her sugars have been well controlled.  States she is here tonight for pain control because her hydrocodone at home is not working.  The history is provided by the patient and the EMS personnel.  Foot Pain  Pertinent negatives include no abdominal pain, no headaches and no shortness of breath.    Past Medical History:  Diagnosis Date  . Acute osteomyelitis of ankle or foot, left ([place]) 01/31/2018  . Alveolar hypoventilation   . Anemia    not on iron pill  . Asthma   . Bipolar 2 disorder ([place])   . Carpal tunnel syndrome on right    recurrent  . Cellulitis 08/2010-08/2011  . Chronic pain   . COPD (chronic obstructive pulmonary disease) ([name])    Symbicort daily and Proventil as needed  . Costochondritis   . Depression   . Diabetes mellitus 2000   Type 2, Uncontrolled.Takes Lantus daily.Fasting blood sugar runs 150  . Dizziness    occasionally  . Drug-seeking behavior   . GERD (gastroesophageal reflux disease)    takes Pantoprazole and Zantac daily  . Headache    migraine-last one about a yr ago.Topamax daily  . HLD (hyperlipidemia)    takes  Atorvastatin daily  . Hypertension    takes Lisinopril and Coreg daily  . Morbid obesity ([place])   . Muscle spasm    takes Flexeril as needed  . Nocturia   . Obstructive sleep apnea   . Peripheral neuropathy    takes Gabapentin daily  . Pneumonia    "walking" several yrs ago and as a baby  . Rectal fissure   . Restless leg   . Urinary frequency   . Varicose veins    Right medial thigh and Left leg     Patient Active Problem List   Diagnosis Date Noted  . Cellulitis of great toe of right foot 10/29/2018  . Osteomyelitis of great toe of right foot ([place]) 10/29/2018  . Vulvovaginitis 10/23/2018  . Encounter for orthopedic aftercare following surgical amputation 06/28/2018  . Uncontrolled type 2 diabetes mellitus with insulin therapy ([place]) 04/16/2018  . Amputation stump infection ([place]) 04/09/2018  . Allergic rhinitis 03/06/2018  . Unilateral complete BKA, left, sequela ([place])   . Decreased pedal pulses 06/10/2017  . Unilateral primary osteoarthritis, right knee 10/22/2016  . Primary osteoarthritis of first carpometacarpal joint of left hand 07/30/2016  . Diabetic neuropathy ([place]) 07/14/2016  . Syncope 02/25/2016  . De Quervain's tenosynovitis, bilateral 11/01/2015  . Vitamin D deficiency 09/05/2015  . Recurrent candidiasis of vagina 09/05/2015  . Varicose veins of leg with complications  06/11/2015  . Restless leg syndrome 10/17/2014  . Chronic sinusitis 07/18/2014  . Headache 07/15/2014  . Urge incontinence 10/15/2013  . Encounter for chronic pain management 06/30/2013  . HLD (hyperlipidemia) 11/19/2012  . Chest pain 06/27/2012  . Right carpal tunnel syndrome 09/01/2011  . Bilateral knee pain 09/01/2011  . Diabetes mellitus ([place]) 05/22/2008  . Morbid obesity ([place]) 05/22/2008  . 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 Depression 05/22/2008  . Obstructive sleep apnea 05/22/2008  . Hypertension 05/22/2008  . Asthma 05/22/2008  . GERD 05/22/2008    Past Surgical  History:  Procedure Laterality Date  . AMPUTATION Left 02/01/2018   Procedure: LEFT FOURTH AND 5TH TOE RAY AMPUTATION;  Surgeon: [name], MD;  Location: [facility];  Service: Orthopedics;  Laterality: Left;  . AMPUTATION Left 03/03/2018   Procedure: LEFT BELOW KNEE AMPUTATION;  Surgeon: [name], MD;  Location: [facility];  Service: Orthopedics;  Laterality: Left;  . CARPAL TUNNEL RELEASE Bilateral   . CESAREAN SECTION    . KNEE ARTHROSCOPY Right 07/17/2010  . LEFT HEART CATHETERIZATION WITH CORONARY ANGIOGRAM N/A 07/27/2012   Procedure: LEFT HEART CATHETERIZATION WITH CORONARY ANGIOGRAM;  Surgeon: Sherren Mocha, MD;  Location: [facility] CATH LAB;  Service: Cardiovascular;  Laterality: N/A;  . left knee surgery     screws she thinks  . MASS EXCISION N/A 06/29/2013   Procedure:  WIDE LOCAL EXCISION OF POSTERIOR NECK ABSCESS;  Surgeon: Ralene Ok, MD;  Location: [facility];  Service: General;  Laterality: N/A;     OB History    Gravida  2   Para  1   Term      Preterm      AB  1   Living  1     SAB  1   TAB      Ectopic      Multiple      Live Births               Home Medications    Prior to Admission medications   Medication Sig Start Date End Date Taking? Authorizing Provider  ACCU-CHEK SOFTCLIX LANCETS lancets Use as instructed to check blood sugar 3 times per day 03/16/18   Angiulli, Lavon Paganini, PA-C  albuterol (PROVENTIL HFA;VENTOLIN HFA) 108 (90 Base) MCG/ACT inhaler Inhale 1-2 puffs into the lungs every 6 (six) hours as needed for wheezing or shortness of breath. 01/17/18   [name], MD  atorvastatin (LIPITOR) 20 MG tablet Take 1 tablet (20 mg total) by mouth daily. 03/16/18   Angiulli, Lavon Paganini, PA-C  Blood Glucose Monitoring Suppl (ACCU-CHEK NANO SMARTVIEW) w/Device KIT Use to check blood sugar 3 times daily 03/16/18   Angiulli, Lavon Paganini, PA-C  budesonide-formoterol (SYMBICORT) 160-4.5 MCG/ACT inhaler inhale 2 PUFFS into THE lungs 2 TIMES DAILY Patient  taking differently: Inhale 2 puffs into the lungs 2 (two) times daily.  10/10/18   [name], MD  cetirizine (ZYRTEC) 10 MG tablet Take 1 tablet (10 mg total) by mouth daily. Patient taking differently: Take 10 mg by mouth daily as needed for allergies.  02/28/18   [name], MD  Cholecalciferol 1000 units tablet Take 1 tablet (1,000 Units total) by mouth daily. 04/05/17   [name], MD  gabapentin (NEURONTIN) 600 MG tablet TAKE 2 TABLETS BY MOUTH 3 TIMES DAILY 11/22/18   [name], MD  gentamicin cream (GARAMYCIN) 0.1 % Apply 1 application topically 3 (three) times daily. 09/11/18   Amalia Hailey,  Dorathy Daft, DPM  glucose blood (ACCU-CHEK GUIDE) test strip USE T0 CHECK BLOOD SUGAR 3 TIMES DAILY 03/16/18   Angiulli, Lavon Paganini, PA-C  insulin aspart (NOVOLOG) 100 UNIT/ML FlexPen Inject 6 Units into the skin 3 (three) times daily with meals. 03/16/18   Angiulli, Lavon Paganini, PA-C  insulin degludec (TRESIBA FLEXTOUCH) 100 UNIT/ML SOPN FlexTouch Pen Inject 85 Units into the skin daily.    [provider]  lisinopril (PRINIVIL,ZESTRIL) 2.5 MG tablet Take 2.5 mg by mouth daily.    [provider]  metFORMIN (GLUCOPHAGE-XR) 500 MG 24 hr tablet Take 500 mg by mouth daily with breakfast.  04/26/18 04/26/19  [provider]  nystatin (MYCOSTATIN/NYSTOP) powder Apply topically 4 (four) times daily. Patient taking differently: Apply 1 g topically 4 (four) times daily.  06/20/18   [name], MD  nystatin cream (MYCOSTATIN) Apply 1 application topically 2 (two) times daily. 06/20/18   [name], MD  pantoprazole (PROTONIX) 40 MG tablet Take 1 tablet (40 mg total) by mouth daily. 03/16/18   Angiulli, Lavon Paganini, PA-C    Family History Family History  Problem Relation Age of Onset  . Diabetes Mother   . Hyperlipidemia Mother   . Depression Mother   . Varicose Veins Mother   . Heart attack Paternal Uncle   . Heart disease Paternal Grandmother   .  Heart attack Paternal Grandmother   . Heart attack Paternal Grandfather   . Heart disease Paternal Grandfather   . Heart attack Father   . Cancer Maternal Grandmother        COLON  . Hypertension Maternal Grandmother   . Hyperlipidemia Maternal Grandmother   . Diabetes Maternal Grandmother   . Other Maternal Grandfather        GUN SHOT    Social History Social History   Tobacco Use  . Smoking status: Former Smoker    Packs/day: 0.30    Years: 0.30    Pack years: 0.09    Types: Cigarettes    Last attempt to quit: 12/06/1993    Years since quitting: 24.9  . Smokeless tobacco: Never Used  Substance Use Topics  . Alcohol use: No    Alcohol/week: 0.0 standard drinks  . Drug use: Yes    Comment: OD attempts on home meds       Allergies   Kiwi extract; Trental [pentoxifylline]; and Nubain [nalbuphine hcl]   Review of Systems Review of Systems  Constitutional: Negative for activity change, appetite change and fever.  Respiratory: Negative for cough, chest tightness and shortness of breath.   Gastrointestinal: Negative for abdominal pain, nausea and vomiting.  Genitourinary: Negative for dysuria.  Musculoskeletal: Positive for arthralgias and myalgias.  Skin: Positive for wound.  Neurological: Negative for headaches.   all other systems are negative except as noted in the HPI and PMH.     Physical Exam Updated Vital Signs There were no vitals taken for this visit.  Physical Exam Vitals signs and nursing note reviewed.  Constitutional:      General: She is not in acute distress.    Appearance: She is well-developed.  HENT:     Head: Normocephalic and atraumatic.     Mouth/Throat:     Pharynx: No oropharyngeal exudate.  Eyes:     Conjunctiva/sclera: Conjunctivae normal.     Pupils: Pupils are equal, round, and reactive to light.  Neck:     Musculoskeletal: Normal range of motion and neck supple.     Comments: No  meningismus. Cardiovascular:     Rate and  Rhythm: Normal rate and regular rhythm.     Heart sounds: Normal heart sounds. No murmur.  Pulmonary:     Effort: Pulmonary effort is normal. No respiratory distress.     Breath sounds: Normal breath sounds.  Abdominal:     Palpations: Abdomen is soft.     Tenderness: There is no abdominal tenderness. There is no guarding or rebound.  Musculoskeletal: Normal range of motion.        General: No tenderness.     Comments: Left BKA  Open wound present at site of great toe amputation with surrounding granulation tissue.  Mild surrounding erythema.  There is no significant drainage Intact DP pulse   Skin:    General: Skin is warm.  Neurological:     Mental Status: She is alert and oriented to person, place, and time.     Cranial Nerves: No cranial nerve deficit.     Motor: No abnormal muscle tone.     Coordination: Coordination normal.     Comments: No ataxia on finger to nose bilaterally. No pronator drift. 5/5 strength throughout. CN 2-12 intact.Equal grip strength. Sensation intact.   Psychiatric:        Behavior: Behavior normal.        ED Treatments / Results  Labs (all labs ordered are listed, but only abnormal results are displayed) Labs Reviewed  CBG MONITORING, ED - Abnormal; Notable for the following components:      Result Value   Glucose-Capillary 107 (*)    All other components within normal limits    EKG None  Radiology Dg Foot Complete Right  Result Date: 11/23/2018 CLINICAL DATA:  Right distal foot pain EXAM: RIGHT FOOT COMPLETE - 3+ VIEW COMPARISON:  None. FINDINGS: Status post right big toe amputation at the mid metatarsal. Soft tissue defect present distally. No osteolysis. No soft tissue gas. The other bones are normal. Extensive vascular calcification. IMPRESSION: Status post right great toe amputation without radiographic evidence of osteomyelitis. Electronically Signed   By: Ulyses Jarred M.D.   On: 11/23/2018 04:45    Procedures Procedures  (including critical care time)  Medications Ordered in ED Medications  HYDROmorphone (DILAUDID) injection 1 mg (has no administration in time range)     Initial Impression / Assessment and Plan / ED Course  I have reviewed the triage vital signs and the nursing notes.  Pertinent labs & imaging results that were available during my care of the patient were reviewed by me and considered in my medical decision making (see chart for details).    Pain at site of previous great toe amputation.  No fever or infectious symptoms.  Continues to be on antibiotics.  Wound appears to be healing appropriately.  Foot is neurovascularly intact.   X-ray shows no postoperative complications or signs of recurrent osteomyelitis.  [place] narcotic database reviewed and shows patient received 90 10 mg hydrocodone/acetaminophen tablets on December 12.  Discussed with patient that further pain medication cannot be provided from the ED.  Continue her antibiotics, wound care, follow-up with her specialist.  Return precautions discussed.  Final Clinical Impressions(s) / ED Diagnoses   Final diagnoses:  Post-operative pain    ED Discharge Orders    None       Obie Silos, Annie Main, MD 11/23/18 [phone]

## 2018-11-24 ENCOUNTER — Inpatient Hospital Stay: Payer: Medicaid Other | Admitting: Family Medicine

## 2018-11-24 DIAGNOSIS — Z7951 Long term (current) use of inhaled steroids: Secondary | ICD-10-CM | POA: Diagnosis not present

## 2018-11-24 DIAGNOSIS — E1140 Type 2 diabetes mellitus with diabetic neuropathy, unspecified: Secondary | ICD-10-CM | POA: Diagnosis not present

## 2018-11-24 DIAGNOSIS — Z8739 Personal history of other diseases of the musculoskeletal system and connective tissue: Secondary | ICD-10-CM | POA: Diagnosis not present

## 2018-11-24 DIAGNOSIS — M86171 Other acute osteomyelitis, right ankle and foot: Secondary | ICD-10-CM | POA: Diagnosis not present

## 2018-11-24 DIAGNOSIS — E785 Hyperlipidemia, unspecified: Secondary | ICD-10-CM | POA: Diagnosis not present

## 2018-11-24 DIAGNOSIS — E1165 Type 2 diabetes mellitus with hyperglycemia: Secondary | ICD-10-CM | POA: Diagnosis not present

## 2018-11-24 DIAGNOSIS — Z89512 Acquired absence of left leg below knee: Secondary | ICD-10-CM | POA: Diagnosis not present

## 2018-11-24 DIAGNOSIS — Z794 Long term (current) use of insulin: Secondary | ICD-10-CM | POA: Diagnosis not present

## 2018-11-24 DIAGNOSIS — Z79899 Other long term (current) drug therapy: Secondary | ICD-10-CM | POA: Diagnosis not present

## 2018-11-24 DIAGNOSIS — Z4781 Encounter for orthopedic aftercare following surgical amputation: Secondary | ICD-10-CM | POA: Diagnosis not present

## 2018-11-24 DIAGNOSIS — E1142 Type 2 diabetes mellitus with diabetic polyneuropathy: Secondary | ICD-10-CM | POA: Diagnosis not present

## 2018-11-24 DIAGNOSIS — I10 Essential (primary) hypertension: Secondary | ICD-10-CM | POA: Diagnosis not present

## 2018-11-24 DIAGNOSIS — Z4801 Encounter for change or removal of surgical wound dressing: Secondary | ICD-10-CM | POA: Diagnosis not present

## 2018-11-24 DIAGNOSIS — E669 Obesity, unspecified: Secondary | ICD-10-CM | POA: Diagnosis not present

## 2018-11-24 DIAGNOSIS — Z87891 Personal history of nicotine dependence: Secondary | ICD-10-CM | POA: Diagnosis not present

## 2018-11-24 DIAGNOSIS — G4733 Obstructive sleep apnea (adult) (pediatric): Secondary | ICD-10-CM | POA: Diagnosis not present

## 2018-11-24 DIAGNOSIS — M86172 Other acute osteomyelitis, left ankle and foot: Secondary | ICD-10-CM | POA: Diagnosis not present

## 2018-11-24 DIAGNOSIS — J9620 Acute and chronic respiratory failure, unspecified whether with hypoxia or hypercapnia: Secondary | ICD-10-CM | POA: Diagnosis not present

## 2018-11-24 DIAGNOSIS — Z89411 Acquired absence of right great toe: Secondary | ICD-10-CM | POA: Diagnosis not present

## 2018-11-24 DIAGNOSIS — J45909 Unspecified asthma, uncomplicated: Secondary | ICD-10-CM | POA: Diagnosis not present

## 2018-11-24 DIAGNOSIS — Z5181 Encounter for therapeutic drug level monitoring: Secondary | ICD-10-CM | POA: Diagnosis not present

## 2018-11-24 DIAGNOSIS — Z792 Long term (current) use of antibiotics: Secondary | ICD-10-CM | POA: Diagnosis not present

## 2018-11-24 DIAGNOSIS — Z89422 Acquired absence of other left toe(s): Secondary | ICD-10-CM | POA: Diagnosis not present

## 2018-11-24 DIAGNOSIS — F329 Major depressive disorder, single episode, unspecified: Secondary | ICD-10-CM | POA: Diagnosis not present

## 2018-11-24 DIAGNOSIS — Z452 Encounter for adjustment and management of vascular access device: Secondary | ICD-10-CM | POA: Diagnosis not present

## 2018-11-24 DIAGNOSIS — M1990 Unspecified osteoarthritis, unspecified site: Secondary | ICD-10-CM | POA: Diagnosis not present

## 2018-11-24 DIAGNOSIS — T8740 Infection of amputation stump, unspecified extremity: Secondary | ICD-10-CM | POA: Diagnosis not present

## 2018-11-25 DIAGNOSIS — E1140 Type 2 diabetes mellitus with diabetic neuropathy, unspecified: Secondary | ICD-10-CM | POA: Diagnosis not present

## 2018-11-26 DIAGNOSIS — E1140 Type 2 diabetes mellitus with diabetic neuropathy, unspecified: Secondary | ICD-10-CM | POA: Diagnosis not present

## 2018-11-27 DIAGNOSIS — Z4801 Encounter for change or removal of surgical wound dressing: Secondary | ICD-10-CM | POA: Diagnosis not present

## 2018-11-27 DIAGNOSIS — E1165 Type 2 diabetes mellitus with hyperglycemia: Secondary | ICD-10-CM | POA: Diagnosis not present

## 2018-11-27 DIAGNOSIS — Z792 Long term (current) use of antibiotics: Secondary | ICD-10-CM | POA: Diagnosis not present

## 2018-11-27 DIAGNOSIS — Z8739 Personal history of other diseases of the musculoskeletal system and connective tissue: Secondary | ICD-10-CM | POA: Diagnosis not present

## 2018-11-27 DIAGNOSIS — Z89411 Acquired absence of right great toe: Secondary | ICD-10-CM | POA: Diagnosis not present

## 2018-11-27 DIAGNOSIS — Z794 Long term (current) use of insulin: Secondary | ICD-10-CM | POA: Diagnosis not present

## 2018-11-27 DIAGNOSIS — I10 Essential (primary) hypertension: Secondary | ICD-10-CM | POA: Diagnosis not present

## 2018-11-27 DIAGNOSIS — Z4781 Encounter for orthopedic aftercare following surgical amputation: Secondary | ICD-10-CM | POA: Diagnosis not present

## 2018-11-27 DIAGNOSIS — E1140 Type 2 diabetes mellitus with diabetic neuropathy, unspecified: Secondary | ICD-10-CM | POA: Diagnosis not present

## 2018-11-27 DIAGNOSIS — J45909 Unspecified asthma, uncomplicated: Secondary | ICD-10-CM | POA: Diagnosis not present

## 2018-11-27 DIAGNOSIS — Z79899 Other long term (current) drug therapy: Secondary | ICD-10-CM | POA: Diagnosis not present

## 2018-11-27 DIAGNOSIS — Z89512 Acquired absence of left leg below knee: Secondary | ICD-10-CM | POA: Diagnosis not present

## 2018-11-27 DIAGNOSIS — Z452 Encounter for adjustment and management of vascular access device: Secondary | ICD-10-CM | POA: Diagnosis not present

## 2018-11-27 DIAGNOSIS — E1142 Type 2 diabetes mellitus with diabetic polyneuropathy: Secondary | ICD-10-CM | POA: Diagnosis not present

## 2018-11-27 DIAGNOSIS — Z87891 Personal history of nicotine dependence: Secondary | ICD-10-CM | POA: Diagnosis not present

## 2018-11-27 DIAGNOSIS — F329 Major depressive disorder, single episode, unspecified: Secondary | ICD-10-CM | POA: Diagnosis not present

## 2018-11-27 DIAGNOSIS — Z5181 Encounter for therapeutic drug level monitoring: Secondary | ICD-10-CM | POA: Diagnosis not present

## 2018-11-27 DIAGNOSIS — Z7951 Long term (current) use of inhaled steroids: Secondary | ICD-10-CM | POA: Diagnosis not present

## 2018-11-27 DIAGNOSIS — E785 Hyperlipidemia, unspecified: Secondary | ICD-10-CM | POA: Diagnosis not present

## 2018-11-27 DIAGNOSIS — M1990 Unspecified osteoarthritis, unspecified site: Secondary | ICD-10-CM | POA: Diagnosis not present

## 2018-11-28 DIAGNOSIS — E1140 Type 2 diabetes mellitus with diabetic neuropathy, unspecified: Secondary | ICD-10-CM | POA: Diagnosis not present

## 2018-11-29 DIAGNOSIS — E1140 Type 2 diabetes mellitus with diabetic neuropathy, unspecified: Secondary | ICD-10-CM | POA: Diagnosis not present

## 2018-11-30 ENCOUNTER — Telehealth: Payer: Self-pay

## 2018-11-30 DIAGNOSIS — E1140 Type 2 diabetes mellitus with diabetic neuropathy, unspecified: Secondary | ICD-10-CM | POA: Diagnosis not present

## 2018-11-30 NOTE — Telephone Encounter (Signed)
Dee, with home health services, called nurse line to say the patient refused her last home health visit for this week due to the [place]. Pt is seen 3x weekly usually.

## 2018-12-01 ENCOUNTER — Encounter (HOSPITAL_COMMUNITY): Payer: Self-pay | Admitting: Emergency Medicine

## 2018-12-01 ENCOUNTER — Telehealth: Payer: Self-pay

## 2018-12-01 ENCOUNTER — Other Ambulatory Visit: Payer: Self-pay

## 2018-12-01 ENCOUNTER — Emergency Department (HOSPITAL_COMMUNITY): Payer: Medicaid Other

## 2018-12-01 ENCOUNTER — Emergency Department (HOSPITAL_COMMUNITY)
Admission: EM | Admit: 2018-12-01 | Discharge: 2018-12-01 | Disposition: A | Discharge status: Home / Self Care | Payer: Medicaid Other | Attending: Emergency Medicine | Admitting: Emergency Medicine

## 2018-12-01 DIAGNOSIS — Z89512 Acquired absence of left leg below knee: Secondary | ICD-10-CM | POA: Insufficient documentation

## 2018-12-01 DIAGNOSIS — Z79899 Other long term (current) drug therapy: Secondary | ICD-10-CM | POA: Insufficient documentation

## 2018-12-01 DIAGNOSIS — S99921A Unspecified injury of right foot, initial encounter: Secondary | ICD-10-CM | POA: Diagnosis not present

## 2018-12-01 DIAGNOSIS — M79661 Pain in right lower leg: Secondary | ICD-10-CM | POA: Insufficient documentation

## 2018-12-01 DIAGNOSIS — Z5181 Encounter for therapeutic drug level monitoring: Secondary | ICD-10-CM | POA: Diagnosis not present

## 2018-12-01 DIAGNOSIS — I10 Essential (primary) hypertension: Secondary | ICD-10-CM | POA: Diagnosis not present

## 2018-12-01 DIAGNOSIS — E119 Type 2 diabetes mellitus without complications: Secondary | ICD-10-CM | POA: Insufficient documentation

## 2018-12-01 DIAGNOSIS — Z4801 Encounter for change or removal of surgical wound dressing: Secondary | ICD-10-CM | POA: Diagnosis not present

## 2018-12-01 DIAGNOSIS — M1990 Unspecified osteoarthritis, unspecified site: Secondary | ICD-10-CM | POA: Diagnosis not present

## 2018-12-01 DIAGNOSIS — E785 Hyperlipidemia, unspecified: Secondary | ICD-10-CM | POA: Diagnosis not present

## 2018-12-01 DIAGNOSIS — W07XXXA Fall from chair, initial encounter: Secondary | ICD-10-CM | POA: Insufficient documentation

## 2018-12-01 DIAGNOSIS — S8012XA Contusion of left lower leg, initial encounter: Secondary | ICD-10-CM | POA: Diagnosis not present

## 2018-12-01 DIAGNOSIS — R52 Pain, unspecified: Secondary | ICD-10-CM | POA: Diagnosis not present

## 2018-12-01 DIAGNOSIS — Y929 Unspecified place or not applicable: Secondary | ICD-10-CM | POA: Insufficient documentation

## 2018-12-01 DIAGNOSIS — Z452 Encounter for adjustment and management of vascular access device: Secondary | ICD-10-CM | POA: Diagnosis not present

## 2018-12-01 DIAGNOSIS — Z794 Long term (current) use of insulin: Secondary | ICD-10-CM | POA: Diagnosis not present

## 2018-12-01 DIAGNOSIS — Z792 Long term (current) use of antibiotics: Secondary | ICD-10-CM | POA: Diagnosis not present

## 2018-12-01 DIAGNOSIS — Z89422 Acquired absence of other left toe(s): Secondary | ICD-10-CM | POA: Insufficient documentation

## 2018-12-01 DIAGNOSIS — M79674 Pain in right toe(s): Secondary | ICD-10-CM | POA: Insufficient documentation

## 2018-12-01 DIAGNOSIS — Z8739 Personal history of other diseases of the musculoskeletal system and connective tissue: Secondary | ICD-10-CM | POA: Diagnosis not present

## 2018-12-01 DIAGNOSIS — F329 Major depressive disorder, single episode, unspecified: Secondary | ICD-10-CM | POA: Diagnosis not present

## 2018-12-01 DIAGNOSIS — E1142 Type 2 diabetes mellitus with diabetic polyneuropathy: Secondary | ICD-10-CM | POA: Diagnosis not present

## 2018-12-01 DIAGNOSIS — S8992XA Unspecified injury of left lower leg, initial encounter: Secondary | ICD-10-CM | POA: Diagnosis not present

## 2018-12-01 DIAGNOSIS — Y9389 Activity, other specified: Secondary | ICD-10-CM | POA: Diagnosis not present

## 2018-12-01 DIAGNOSIS — W19XXXA Unspecified fall, initial encounter: Secondary | ICD-10-CM

## 2018-12-01 DIAGNOSIS — Z87891 Personal history of nicotine dependence: Secondary | ICD-10-CM | POA: Diagnosis not present

## 2018-12-01 DIAGNOSIS — Z89411 Acquired absence of right great toe: Secondary | ICD-10-CM | POA: Diagnosis not present

## 2018-12-01 DIAGNOSIS — Z4781 Encounter for orthopedic aftercare following surgical amputation: Secondary | ICD-10-CM | POA: Diagnosis not present

## 2018-12-01 DIAGNOSIS — E1140 Type 2 diabetes mellitus with diabetic neuropathy, unspecified: Secondary | ICD-10-CM | POA: Diagnosis not present

## 2018-12-01 DIAGNOSIS — Y999 Unspecified external cause status: Secondary | ICD-10-CM | POA: Insufficient documentation

## 2018-12-01 DIAGNOSIS — Z7951 Long term (current) use of inhaled steroids: Secondary | ICD-10-CM | POA: Diagnosis not present

## 2018-12-01 DIAGNOSIS — J45909 Unspecified asthma, uncomplicated: Secondary | ICD-10-CM | POA: Diagnosis not present

## 2018-12-01 DIAGNOSIS — J449 Chronic obstructive pulmonary disease, unspecified: Secondary | ICD-10-CM | POA: Diagnosis not present

## 2018-12-01 DIAGNOSIS — E1165 Type 2 diabetes mellitus with hyperglycemia: Secondary | ICD-10-CM | POA: Diagnosis not present

## 2018-12-01 LAB — CBG MONITORING, ED: Glucose-Capillary: 283 mg/dL — ABNORMAL HIGH (ref 70–99)

## 2018-12-01 MED ORDER — OXYCODONE-ACETAMINOPHEN 5-325 MG PO TABS
2.0000 | ORAL_TABLET | Freq: Once | ORAL | Status: AC
  Administered 2018-12-01: 2 via ORAL
  Filled 2018-12-01: qty 2

## 2018-12-01 MED ORDER — LIDOCAINE 5 % EX PTCH: 1.0000 | MEDICATED_PATCH | CUTANEOUS | 0 refills | Status: AC

## 2018-12-01 MED ORDER — OXYCODONE HCL 5 MG PO TABS: 5.0000 mg | ORAL_TABLET | Freq: Four times a day (QID) | ORAL | 0 refills | Status: DC | PRN

## 2018-12-01 MED ORDER — HYDROMORPHONE HCL 1 MG/ML IJ SOLN
1.0000 mg | Freq: Once | INTRAMUSCULAR | Status: AC
  Administered 2018-12-01: 1 mg via INTRAVENOUS
  Filled 2018-12-01: qty 1

## 2018-12-01 NOTE — ED Triage Notes (Signed)
BIB [facility] from home with c/o of fall trying to transfer from cough into wheelchair to go to bed. Per ems pt laid on the floor for approx 2 hours. Reports pain to left stump and amputation site on right foot. Per pt right site is open and scheduled to be closed on the 10th.

## 2018-12-01 NOTE — Telephone Encounter (Signed)
[facility] called to let PCP know that visit frequency being decreased to once weekly. Patient now performing wound care independently and PICC line and IV abx have all be D/C'ed.   Danley Danker, RN [facility] RN)

## 2018-12-01 NOTE — ED Provider Notes (Addendum)
[name]   CSN: [id number] Arrival date & time: 12/01/18  0241     History   Chief Complaint Chief Complaint  Patient presents with  . Fall    HPI [name] is a 43 y.o. female.  HPI   42 year old female with extensive past medical history as below here with bilateral leg pain after fall.  The patient was transferring from a chair to her wheelchair just prior to arrival.  She fell directly onto her left stump.  She then tried to get up and feels like she hurt her right first toe, which is status post amputation.  She has had associated severe, aching, stabbing pain in her bilateral legs.  She confirms that she was in her usual state of health prior to the fall.  No headache.  No significant head injury or loss of consciousness.  The the pain is worse with palpation and any attempted movement.  No alleviating factors.  No other complaints.  Past Medical History:  Diagnosis Date  . Acute osteomyelitis of ankle or foot, left ([place]) 01/31/2018  . Alveolar hypoventilation   . Anemia    not on iron pill  . Asthma   . Bipolar 2 disorder ([place])   . Carpal tunnel syndrome on right    recurrent  . Cellulitis 08/2010-08/2011  . Chronic pain   . COPD (chronic obstructive pulmonary disease) ([name])    Symbicort daily and Proventil as needed  . Costochondritis   . Depression   . Diabetes mellitus 2000   Type 2, Uncontrolled.Takes Lantus daily.Fasting blood sugar runs 150  . Dizziness    occasionally  . Drug-seeking behavior   . GERD (gastroesophageal reflux disease)    takes Pantoprazole and Zantac daily  . Headache    migraine-last one about a yr ago.Topamax daily  . HLD (hyperlipidemia)    takes Atorvastatin daily  . Hypertension    takes Lisinopril and Coreg daily  . Morbid obesity ([place])   . Muscle spasm    takes Flexeril as needed  . Nocturia   . Obstructive sleep apnea   . Peripheral neuropathy    takes Gabapentin  daily  . Pneumonia    "walking" several yrs ago and as a baby  . Rectal fissure   . Restless leg   . Urinary frequency   . Varicose veins    Right medial thigh and Left leg     Patient Active Problem List   Diagnosis Date Noted  . Cellulitis of great toe of right foot 10/29/2018  . Osteomyelitis of great toe of right foot ([place]) 10/29/2018  . Vulvovaginitis 10/23/2018  . Encounter for orthopedic aftercare following surgical amputation 06/28/2018  . Uncontrolled type 2 diabetes mellitus with insulin therapy ([place]) 04/16/2018  . Amputation stump infection ([place]) 04/09/2018  . Allergic rhinitis 03/06/2018  . Unilateral complete BKA, left, sequela ([place])   . Decreased pedal pulses 06/10/2017  . Unilateral primary osteoarthritis, right knee 10/22/2016  . Primary osteoarthritis of first carpometacarpal joint of left hand 07/30/2016  . Diabetic neuropathy ([place]) 07/14/2016  . Syncope 02/25/2016  . De Quervain's tenosynovitis, bilateral 11/01/2015  . Vitamin D deficiency 09/05/2015  . Recurrent candidiasis of vagina 09/05/2015  . Varicose veins of leg with complications 38/18/2993  . Restless leg syndrome 10/17/2014  . Chronic sinusitis 07/18/2014  . Headache 07/15/2014  . Urge incontinence 10/15/2013  . Encounter for chronic pain management 06/30/2013  . HLD (hyperlipidemia) 11/19/2012  . Chest  pain 06/27/2012  . Right carpal tunnel syndrome 09/01/2011  . Bilateral knee pain 09/01/2011  . Diabetes mellitus ([place]) 05/22/2008  . Morbid obesity ([place]) 05/22/2008  . 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 Depression 05/22/2008  . Obstructive sleep apnea 05/22/2008  . Hypertension 05/22/2008  . Asthma 05/22/2008  . GERD 05/22/2008    Past Surgical History:  Procedure Laterality Date  . AMPUTATION Left 02/01/2018   Procedure: LEFT FOURTH AND 5TH TOE RAY AMPUTATION;  Surgeon: [name], MD;  Location: [facility];  Service: Orthopedics;  Laterality: Left;  . AMPUTATION Left  03/03/2018   Procedure: LEFT BELOW KNEE AMPUTATION;  Surgeon: [name], MD;  Location: [facility];  Service: Orthopedics;  Laterality: Left;  . CARPAL TUNNEL RELEASE Bilateral   . CESAREAN SECTION    . KNEE ARTHROSCOPY Right 07/17/2010  . LEFT HEART CATHETERIZATION WITH CORONARY ANGIOGRAM N/A 07/27/2012   Procedure: LEFT HEART CATHETERIZATION WITH CORONARY ANGIOGRAM;  Surgeon: Sherren Mocha, MD;  Location: [facility] CATH LAB;  Service: Cardiovascular;  Laterality: N/A;  . left knee surgery     screws she thinks  . MASS EXCISION N/A 06/29/2013   Procedure:  WIDE LOCAL EXCISION OF POSTERIOR NECK ABSCESS;  Surgeon: Ralene Ok, MD;  Location: [facility];  Service: General;  Laterality: N/A;     OB History    Gravida  2   Para  1   Term      Preterm      AB  1   Living  1     SAB  1   TAB      Ectopic      Multiple      Live Births               Home Medications    Prior to Admission medications   Medication Sig Start Date End Date Taking? Authorizing Provider  albuterol (PROVENTIL HFA;VENTOLIN HFA) 108 (90 Base) MCG/ACT inhaler Inhale 1-2 puffs into the lungs every 6 (six) hours as needed for wheezing or shortness of breath. 01/17/18  Yes [name], MD  atorvastatin (LIPITOR) 20 MG tablet Take 1 tablet (20 mg total) by mouth daily. 03/16/18  Yes Angiulli, Lavon Paganini, PA-C  budesonide-formoterol (SYMBICORT) 160-4.5 MCG/ACT inhaler inhale 2 PUFFS into THE lungs 2 TIMES DAILY Patient taking differently: Inhale 2 puffs into the lungs 2 (two) times daily.  10/10/18  Yes Hensel, Jamal Collin, MD  cetirizine (ZYRTEC) 10 MG tablet Take 1 tablet (10 mg total) by mouth daily. Patient taking differently: Take 10 mg by mouth daily as needed for allergies.  02/28/18  Yes [name], MD  Cholecalciferol 1000 units tablet Take 1 tablet (1,000 Units total) by mouth daily. 04/05/17  Yes [name], MD  gabapentin (NEURONTIN) 600 MG tablet TAKE 2 TABLETS BY MOUTH 3 TIMES  DAILY Patient taking differently: Take 1,200 mg by mouth 3 (three) times daily.  11/22/18  Yes [name], MD  Insulin Isophane & Regular Human (HUMULIN 70/30 KWIKPEN) (70-30) 100 UNIT/ML PEN Inject 40-60 Units into the skin See admin instructions. Use 60 units every morning and use 40 units every evening 11/08/18  Yes [provider]  lisinopril (PRINIVIL,ZESTRIL) 2.5 MG tablet Take 2.5 mg by mouth daily.   Yes [provider]  metFORMIN (GLUCOPHAGE-XR) 500 MG 24 hr tablet Take 500 mg by mouth daily with breakfast.  04/26/18 04/26/19 Yes [provider]  pantoprazole (PROTONIX) 40 MG tablet Take 1 tablet (40  mg total) by mouth daily. 03/16/18  Yes Angiulli, Lavon Paganini, PA-C  ACCU-CHEK SOFTCLIX LANCETS lancets Use as instructed to check blood sugar 3 times per day 03/16/18   Cathlyn Parsons, PA-C  Blood Glucose Monitoring Suppl (ACCU-CHEK NANO SMARTVIEW) w/Device KIT Use to check blood sugar 3 times daily 03/16/18   Angiulli, Lavon Paganini, PA-C  gentamicin cream (GARAMYCIN) 0.1 % Apply 1 application topically 3 (three) times daily. Patient not taking: Reported on 12/01/2018 09/11/18   Edrick Kins, DPM  glucose blood (ACCU-CHEK GUIDE) test strip USE T0 CHECK BLOOD SUGAR 3 TIMES DAILY 03/16/18   Angiulli, Lavon Paganini, PA-C  insulin aspart (NOVOLOG) 100 UNIT/ML FlexPen Inject 6 Units into the skin 3 (three) times daily with meals. Patient not taking: Reported on 12/01/2018 03/16/18   Angiulli, Lavon Paganini, PA-C  lidocaine (LIDODERM) 5 % Place 1 patch onto the skin daily for 7 days. Remove & Discard patch within 12 hours or as directed by MD 12/01/18 12/08/18  Duffy Bruce, MD  nystatin (MYCOSTATIN/NYSTOP) powder Apply topically 4 (four) times daily. Patient not taking: Reported on 12/01/2018 06/20/18   [name], MD  nystatin cream (MYCOSTATIN) Apply 1 application topically 2 (two) times daily. Patient not taking: Reported on 12/01/2018 06/20/18   [name],  MD  oxyCODONE (ROXICODONE) 5 MG immediate release tablet Take 1 tablet (5 mg total) by mouth every 6 (six) hours as needed for breakthrough pain. 12/01/18   Duffy Bruce, MD    Family History Family History  Problem Relation Age of Onset  . Diabetes Mother   . Hyperlipidemia Mother   . Depression Mother   . Varicose Veins Mother   . Heart attack Paternal Uncle   . Heart disease Paternal Grandmother   . Heart attack Paternal Grandmother   . Heart attack Paternal Grandfather   . Heart disease Paternal Grandfather   . Heart attack Father   . Cancer Maternal Grandmother        COLON  . Hypertension Maternal Grandmother   . Hyperlipidemia Maternal Grandmother   . Diabetes Maternal Grandmother   . Other Maternal Grandfather        GUN SHOT    Social History Social History   Tobacco Use  . Smoking status: Former Smoker    Packs/day: 0.30    Years: 0.30    Pack years: 0.09    Types: Cigarettes    Last attempt to quit: 12/06/1993    Years since quitting: 25.0  . Smokeless tobacco: Never Used  Substance Use Topics  . Alcohol use: No    Alcohol/week: 0.0 standard drinks  . Drug use: Yes    Comment: OD attempts on home meds       Allergies   Kiwi extract; Trental [pentoxifylline]; and Nubain [nalbuphine hcl]   Review of Systems Review of Systems  Constitutional: Negative for chills and fever.  HENT: Negative for congestion, rhinorrhea and sore throat.   Eyes: Negative for visual disturbance.  Respiratory: Negative for cough, shortness of breath and wheezing.   Cardiovascular: Negative for chest pain and leg swelling.  Gastrointestinal: Negative for abdominal pain, diarrhea, nausea and vomiting.  Genitourinary: Negative for dysuria, flank pain, vaginal bleeding and vaginal discharge.  Musculoskeletal: Positive for arthralgias, gait problem and myalgias. Negative for neck pain.  Skin: Negative for rash.  Allergic/Immunologic: Negative for immunocompromised state.    Neurological: Negative for syncope and headaches.  Hematological: Does not bruise/bleed easily.  All other systems reviewed and are negative.  Physical Exam Updated Vital Signs BP 124/86   Pulse (!) 115   Temp 98.3 F (36.8 C) (Oral)   Resp 18   Ht _0  (1.575 m)   Wt (!) 140 kg   SpO2 99%   BMI 56.45 kg/m   Physical Exam Vitals signs and nursing Hall reviewed.  Constitutional:      General: She is not in acute distress.    Appearance: She is well-developed.  HENT:     Head: Normocephalic and atraumatic.  Eyes:     Conjunctiva/sclera: Conjunctivae normal.  Neck:     Musculoskeletal: Neck supple.     Comments: No midline or paraspinal TTP Cardiovascular:     Rate and Rhythm: Normal rate and regular rhythm.     Heart sounds: Normal heart sounds.  Pulmonary:     Effort: Pulmonary effort is normal. No respiratory distress.     Breath sounds: No wheezing.  Abdominal:     General: There is no distension.  Skin:    General: Skin is warm.     Capillary Refill: Capillary refill takes less than 2 seconds.     Findings: No rash.  Neurological:     Mental Status: She is alert and oriented to person, place, and time.     Motor: No abnormal muscle tone.     LOWER EXTREMITY EXAM: LEFT  INSPECTION & PALPATION: R foot s/p first digit amputation. Intact granulation tissue, dry, at wound bed with no surrounding erythema, drainage, or discharge. Left BKA site with diffuse TTP along stump but no erythema, open wounds, warmth, or other lesions.  VASCULAR: 1+ dorsalis pedis and posterior tibialis pulse on right Capillary refill < 2 sec, toes warm and well-perfused  COMPARTMENTS: Soft, warm, well-perfused No pain with passive extension No parethesias    ED Treatments / Results  Labs (all labs ordered are listed, but only abnormal results are displayed) Labs Reviewed  CBG MONITORING, ED - Abnormal; Notable for the following components:      Result Value    Glucose-Capillary 283 (*)    All other components within normal limits    EKG None  Radiology Dg Knee Complete 4 Views Left  Result Date: 12/01/2018 CLINICAL DATA:  [place] today, injured stump. EXAM: LEFT KNEE - COMPLETE 4+ VIEW COMPARISON:  LEFT knee radiograph July 16, 2018 FINDINGS: Status post below-knee amputation with increased periosteal reaction about the tibia stump. No fracture deformity. No dislocation. Lateral tibial staple. Coarse calcifications project and lateral knee, unchanged. Stable mild tricompartmental osteoarthrosis. Advanced vascular calcifications and sheet like calcifications within stump soft tissues. No subcutaneous gas or radiopaque foreign bodies. IMPRESSION: 1. No acute fracture deformity or dislocation. 2. Status post below-knee amputation. Reactive changes at the stomach, less likely osteomyelitis. Electronically Signed   By: Elon Alas M.D.   On: 12/01/2018 04:24   Dg Foot Complete Right  Result Date: 12/01/2018 CLINICAL DATA:  [place] today, struck stump. EXAM: RIGHT FOOT COMPLETE - 3+ VIEW COMPARISON:  RIGHT foot radiograph November 23, 2018 FINDINGS: Status post RIGHT first transmetatarsal amputation, similar appearance. No fracture deformity. No dislocation. Large plantar calcaneal spur. No destructive bony lesions. Advanced vascular calcifications with sheet like calcifications in included tibia and fibula soft tissues. IMPRESSION: 1. No acute fracture deformity or dislocation. 2. Status post first transmetatarsal amputation with stable appearance, no radiographic findings of osteomyelitis. Electronically Signed   By: Elon Alas M.D.   On: 12/01/2018 04:22    Procedures Procedures (including critical care time)  Medications Ordered in ED Medications  HYDROmorphone (DILAUDID) injection 1 mg (1 mg Intravenous Given 12/01/18 0358)  oxyCODONE-acetaminophen (PERCOCET/ROXICET) 5-325 MG per tablet 2 tablet (2 tablets Oral Given 12/01/18 0526)      Initial Impression / Assessment and Plan / ED Course  I have reviewed the triage vital signs and the nursing notes.  Pertinent labs & imaging results that were available during my care of the patient were reviewed by me and considered in my medical decision making (see chart for details).     42 yo F here w/o bilateral LE pain status post mechanical fall. No head injury or LOC. Plain films neg. No fever, redness, warmth, or signs of infectious process. Her R first toe wound is healing well and looks @ baseline per pt. Will d/c with analgesia, outpt follow-up. VSS (HR initially elevated likely 2/2 pain, improved w/ analgesia).  Final Clinical Impressions(s) / ED Diagnoses   Final diagnoses:  Fall, initial encounter  Contusion of left lower leg, initial encounter    ED Discharge Orders         Ordered    oxyCODONE (ROXICODONE) 5 MG immediate release tablet  Every 6 hours PRN     12/01/18 0707    lidocaine (LIDODERM) 5 %  Every 24 hours     12/01/18 0707           Duffy Bruce, MD 12/01/18 5107    Duffy Bruce, MD 12/01/18 [phone]

## 2018-12-02 ENCOUNTER — Emergency Department (HOSPITAL_COMMUNITY)
Admission: EM | Admit: 2018-12-02 | Discharge: 2018-12-02 | Disposition: A | Discharge status: Home / Self Care | Payer: Medicaid Other | Attending: Emergency Medicine | Admitting: Emergency Medicine

## 2018-12-02 DIAGNOSIS — Z89422 Acquired absence of other left toe(s): Secondary | ICD-10-CM | POA: Diagnosis not present

## 2018-12-02 DIAGNOSIS — Z89512 Acquired absence of left leg below knee: Secondary | ICD-10-CM | POA: Diagnosis not present

## 2018-12-02 DIAGNOSIS — Z87891 Personal history of nicotine dependence: Secondary | ICD-10-CM | POA: Diagnosis not present

## 2018-12-02 DIAGNOSIS — E119 Type 2 diabetes mellitus without complications: Secondary | ICD-10-CM | POA: Insufficient documentation

## 2018-12-02 DIAGNOSIS — I10 Essential (primary) hypertension: Secondary | ICD-10-CM | POA: Insufficient documentation

## 2018-12-02 DIAGNOSIS — J45909 Unspecified asthma, uncomplicated: Secondary | ICD-10-CM | POA: Insufficient documentation

## 2018-12-02 DIAGNOSIS — M79676 Pain in unspecified toe(s): Secondary | ICD-10-CM | POA: Diagnosis not present

## 2018-12-02 DIAGNOSIS — M79674 Pain in right toe(s): Secondary | ICD-10-CM | POA: Diagnosis not present

## 2018-12-02 DIAGNOSIS — Z89411 Acquired absence of right great toe: Secondary | ICD-10-CM | POA: Insufficient documentation

## 2018-12-02 DIAGNOSIS — M25571 Pain in right ankle and joints of right foot: Secondary | ICD-10-CM | POA: Diagnosis not present

## 2018-12-02 DIAGNOSIS — Z79899 Other long term (current) drug therapy: Secondary | ICD-10-CM | POA: Insufficient documentation

## 2018-12-02 DIAGNOSIS — E1165 Type 2 diabetes mellitus with hyperglycemia: Secondary | ICD-10-CM | POA: Diagnosis not present

## 2018-12-02 DIAGNOSIS — Z794 Long term (current) use of insulin: Secondary | ICD-10-CM | POA: Insufficient documentation

## 2018-12-02 DIAGNOSIS — E1140 Type 2 diabetes mellitus with diabetic neuropathy, unspecified: Secondary | ICD-10-CM | POA: Diagnosis not present

## 2018-12-02 DIAGNOSIS — R52 Pain, unspecified: Secondary | ICD-10-CM | POA: Diagnosis not present

## 2018-12-02 MED ORDER — LIDOCAINE 5 % EX PTCH
1.0000 | MEDICATED_PATCH | CUTANEOUS | Status: DC
  Administered 2018-12-02: 1 via TRANSDERMAL
  Filled 2018-12-02: qty 1

## 2018-12-02 MED ORDER — OXYCODONE HCL 5 MG PO TABS
5.0000 mg | ORAL_TABLET | Freq: Once | ORAL | Status: AC
  Administered 2018-12-02: 5 mg via ORAL
  Filled 2018-12-02: qty 1

## 2018-12-02 MED ORDER — ACETAMINOPHEN 500 MG PO TABS
1000.0000 mg | ORAL_TABLET | Freq: Once | ORAL | Status: AC
  Administered 2018-12-02: 1000 mg via ORAL
  Filled 2018-12-02: qty 2

## 2018-12-02 NOTE — ED Notes (Signed)
pts amputated R great toe was dressed with a wet to dry dressing.

## 2018-12-02 NOTE — ED Notes (Signed)
Pt called a family member for a ride.  Pt agrees to wait in lobby until they get here.

## 2018-12-02 NOTE — ED Triage Notes (Signed)
Pt had her right big toe amputated in November and she fell on it this am and now has pain, she was seen for the same at [facility] 2 days ago, EMS reports that she has a pain med prescription on her table that she hasn't filled

## 2018-12-02 NOTE — ED Provider Notes (Signed)
[facility] DEPT Provider Note   CSN: [id number] Arrival date & time: 12/02/18  0620     History   Chief Complaint Chief Complaint  Patient presents with  . Toe Pain    HPI [name] is a 42 y.o. female.  The history is provided by the patient.  Toe Pain  This is a chronic problem. The current episode started more than 1 week ago. The problem occurs daily. The problem has not changed since onset.Pertinent negatives include no headaches. Nothing aggravates the symptoms. Relieved by: opiods. She has tried acetaminophen for the symptoms. The treatment provided no relief.    Past Medical History:  Diagnosis Date  . Acute osteomyelitis of ankle or foot, left ([place]) 01/31/2018  . Alveolar hypoventilation   . Anemia    not on iron pill  . Asthma   . Bipolar 2 disorder ([place])   . Carpal tunnel syndrome on right    recurrent  . Cellulitis 08/2010-08/2011  . Chronic pain   . COPD (chronic obstructive pulmonary disease) ([name])    Symbicort daily and Proventil as needed  . Costochondritis   . Depression   . Diabetes mellitus 2000   Type 2, Uncontrolled.Takes Lantus daily.Fasting blood sugar runs 150  . Dizziness    occasionally  . Drug-seeking behavior   . GERD (gastroesophageal reflux disease)    takes Pantoprazole and Zantac daily  . Headache    migraine-last one about a yr ago.Topamax daily  . HLD (hyperlipidemia)    takes Atorvastatin daily  . Hypertension    takes Lisinopril and Coreg daily  . Morbid obesity ([place])   . Muscle spasm    takes Flexeril as needed  . Nocturia   . Obstructive sleep apnea   . Peripheral neuropathy    takes Gabapentin daily  . Pneumonia    "walking" several yrs ago and as a baby  . Rectal fissure   . Restless leg   . Urinary frequency   . Varicose veins    Right medial thigh and Left leg     Patient Active Problem List   Diagnosis Date Noted  . Cellulitis of great toe of right foot 10/29/2018  .  Osteomyelitis of great toe of right foot ([place]) 10/29/2018  . Vulvovaginitis 10/23/2018  . Encounter for orthopedic aftercare following surgical amputation 06/28/2018  . Uncontrolled type 2 diabetes mellitus with insulin therapy ([place]) 04/16/2018  . Amputation stump infection ([place]) 04/09/2018  . Allergic rhinitis 03/06/2018  . Unilateral complete BKA, left, sequela ([place])   . Decreased pedal pulses 06/10/2017  . Unilateral primary osteoarthritis, right knee 10/22/2016  . Primary osteoarthritis of first carpometacarpal joint of left hand 07/30/2016  . Diabetic neuropathy ([place]) 07/14/2016  . Syncope 02/25/2016  . De Quervain's tenosynovitis, bilateral 11/01/2015  . Vitamin D deficiency 09/05/2015  . Recurrent candidiasis of vagina 09/05/2015  . Varicose veins of leg with complications 59/56/3875  . Restless leg syndrome 10/17/2014  . Chronic sinusitis 07/18/2014  . Headache 07/15/2014  . Urge incontinence 10/15/2013  . Encounter for chronic pain management 06/30/2013  . HLD (hyperlipidemia) 11/19/2012  . Chest pain 06/27/2012  . Right carpal tunnel syndrome 09/01/2011  . Bilateral knee pain 09/01/2011  . Diabetes mellitus ([place]) 05/22/2008  . Morbid obesity ([place]) 05/22/2008  . 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 Depression 05/22/2008  . Obstructive sleep apnea 05/22/2008  . Hypertension 05/22/2008  . Asthma 05/22/2008  . GERD 05/22/2008    Past Surgical History:  Procedure  Laterality Date  . AMPUTATION Left 02/01/2018   Procedure: LEFT FOURTH AND 5TH TOE RAY AMPUTATION;  Surgeon: [name], MD;  Location: [facility];  Service: Orthopedics;  Laterality: Left;  . AMPUTATION Left 03/03/2018   Procedure: LEFT BELOW KNEE AMPUTATION;  Surgeon: [name], MD;  Location: [facility];  Service: Orthopedics;  Laterality: Left;  . CARPAL TUNNEL RELEASE Bilateral   . CESAREAN SECTION    . KNEE ARTHROSCOPY Right 07/17/2010  . LEFT HEART CATHETERIZATION WITH CORONARY ANGIOGRAM N/A  07/27/2012   Procedure: LEFT HEART CATHETERIZATION WITH CORONARY ANGIOGRAM;  Surgeon: Sherren Mocha, MD;  Location: [facility] CATH LAB;  Service: Cardiovascular;  Laterality: N/A;  . left knee surgery     screws she thinks  . MASS EXCISION N/A 06/29/2013   Procedure:  WIDE LOCAL EXCISION OF POSTERIOR NECK ABSCESS;  Surgeon: Ralene Ok, MD;  Location: [facility];  Service: General;  Laterality: N/A;     OB History    Gravida  2   Para  1   Term      Preterm      AB  1   Living  1     SAB  1   TAB      Ectopic      Multiple      Live Births               Home Medications    Prior to Admission medications   Medication Sig Start Date End Date Taking? Authorizing Provider  ACCU-CHEK SOFTCLIX LANCETS lancets Use as instructed to check blood sugar 3 times per day 03/16/18   Angiulli, Lavon Paganini, PA-C  albuterol (PROVENTIL HFA;VENTOLIN HFA) 108 (90 Base) MCG/ACT inhaler Inhale 1-2 puffs into the lungs every 6 (six) hours as needed for wheezing or shortness of breath. 01/17/18   [name], MD  atorvastatin (LIPITOR) 20 MG tablet Take 1 tablet (20 mg total) by mouth daily. 03/16/18   Angiulli, Lavon Paganini, PA-C  Blood Glucose Monitoring Suppl (ACCU-CHEK NANO SMARTVIEW) w/Device KIT Use to check blood sugar 3 times daily 03/16/18   Angiulli, Lavon Paganini, PA-C  budesonide-formoterol (SYMBICORT) 160-4.5 MCG/ACT inhaler inhale 2 PUFFS into THE lungs 2 TIMES DAILY Patient taking differently: Inhale 2 puffs into the lungs 2 (two) times daily.  10/10/18   [name], MD  cetirizine (ZYRTEC) 10 MG tablet Take 1 tablet (10 mg total) by mouth daily. Patient taking differently: Take 10 mg by mouth daily as needed for allergies.  02/28/18   [name], MD  Cholecalciferol 1000 units tablet Take 1 tablet (1,000 Units total) by mouth daily. 04/05/17   [name], MD  gabapentin (NEURONTIN) 600 MG tablet TAKE 2 TABLETS BY MOUTH 3 TIMES DAILY Patient taking differently: Take  1,200 mg by mouth 3 (three) times daily.  11/22/18   [name], MD  gentamicin cream (GARAMYCIN) 0.1 % Apply 1 application topically 3 (three) times daily. Patient not taking: Reported on 12/01/2018 09/11/18   Edrick Kins, DPM  glucose blood (ACCU-CHEK GUIDE) test strip USE T0 CHECK BLOOD SUGAR 3 TIMES DAILY 03/16/18   Angiulli, Lavon Paganini, PA-C  insulin aspart (NOVOLOG) 100 UNIT/ML FlexPen Inject 6 Units into the skin 3 (three) times daily with meals. Patient not taking: Reported on 12/01/2018 03/16/18   Angiulli, Lavon Paganini, PA-C  Insulin Isophane & Regular Human (HUMULIN 70/30 KWIKPEN) (70-30) 100 UNIT/ML PEN Inject 40-60 Units into the skin See admin instructions. Use 60  units every morning and use 40 units every evening 11/08/18   [provider]  lidocaine (LIDODERM) 5 % Place 1 patch onto the skin daily for 7 days. Remove & Discard patch within 12 hours or as directed by MD 12/01/18 12/08/18  Duffy Bruce, MD  lisinopril (PRINIVIL,ZESTRIL) 2.5 MG tablet Take 2.5 mg by mouth daily.    [provider]  metFORMIN (GLUCOPHAGE-XR) 500 MG 24 hr tablet Take 500 mg by mouth daily with breakfast.  04/26/18 04/26/19  [provider]  nystatin (MYCOSTATIN/NYSTOP) powder Apply topically 4 (four) times daily. Patient not taking: Reported on 12/01/2018 06/20/18   [name], MD  nystatin cream (MYCOSTATIN) Apply 1 application topically 2 (two) times daily. Patient not taking: Reported on 12/01/2018 06/20/18   [name], MD  oxyCODONE (ROXICODONE) 5 MG immediate release tablet Take 1 tablet (5 mg total) by mouth every 6 (six) hours as needed for breakthrough pain. 12/01/18   Duffy Bruce, MD  pantoprazole (PROTONIX) 40 MG tablet Take 1 tablet (40 mg total) by mouth daily. 03/16/18   Angiulli, Lavon Paganini, PA-C    Family History Family History  Problem Relation Age of Onset  . Diabetes Mother   . Hyperlipidemia Mother   . Depression Mother   . Varicose  Veins Mother   . Heart attack Paternal Uncle   . Heart disease Paternal Grandmother   . Heart attack Paternal Grandmother   . Heart attack Paternal Grandfather   . Heart disease Paternal Grandfather   . Heart attack Father   . Cancer Maternal Grandmother        COLON  . Hypertension Maternal Grandmother   . Hyperlipidemia Maternal Grandmother   . Diabetes Maternal Grandmother   . Other Maternal Grandfather        GUN SHOT    Social History Social History   Tobacco Use  . Smoking status: Former Smoker    Packs/day: 0.30    Years: 0.30    Pack years: 0.09    Types: Cigarettes    Last attempt to quit: 12/06/1993    Years since quitting: 25.0  . Smokeless tobacco: Never Used  Substance Use Topics  . Alcohol use: No    Alcohol/week: 0.0 standard drinks  . Drug use: Yes    Comment: OD attempts on home meds       Allergies   Kiwi extract; Trental [pentoxifylline]; and Nubain [nalbuphine hcl]   Review of Systems Review of Systems  Musculoskeletal: Positive for arthralgias. Negative for back pain, gait problem, joint swelling, myalgias, neck pain and neck stiffness.  Skin: Positive for wound. Negative for color change, pallor and rash.  Neurological: Negative for headaches.     Physical Exam Updated Vital Signs BP (!) 149/94 (BP Location: Left Arm)   Pulse (!) 110   Temp 98.4 F (36.9 C) (Oral)   Resp 19   SpO2 95%   Physical Exam Cardiovascular:     Pulses: Normal pulses.  Musculoskeletal:     Right lower leg: No edema.     Left lower leg: No edema.  Skin:    General: Skin is warm and dry.     Capillary Refill: Capillary refill takes less than 2 seconds.     Comments: Right foot overall well-appearing, well-healing right big toe amputation, no surrounding erythema, drainage      ED Treatments / Results  Labs (all labs ordered are listed, but only abnormal results are displayed) Labs Reviewed - No data to  display  EKG None  Radiology Dg Knee  Complete 4 Views Left  Result Date: 12/01/2018 CLINICAL DATA:  [place] today, injured stump. EXAM: LEFT KNEE - COMPLETE 4+ VIEW COMPARISON:  LEFT knee radiograph July 16, 2018 FINDINGS: Status post below-knee amputation with increased periosteal reaction about the tibia stump. No fracture deformity. No dislocation. Lateral tibial staple. Coarse calcifications project and lateral knee, unchanged. Stable mild tricompartmental osteoarthrosis. Advanced vascular calcifications and sheet like calcifications within stump soft tissues. No subcutaneous gas or radiopaque foreign bodies. IMPRESSION: 1. No acute fracture deformity or dislocation. 2. Status post below-knee amputation. Reactive changes at the stomach, less likely osteomyelitis. Electronically Signed   By: Elon Alas M.D.   On: 12/01/2018 04:24   Dg Foot Complete Right  Result Date: 12/01/2018 CLINICAL DATA:  [place] today, struck stump. EXAM: RIGHT FOOT COMPLETE - 3+ VIEW COMPARISON:  RIGHT foot radiograph November 23, 2018 FINDINGS: Status post RIGHT first transmetatarsal amputation, similar appearance. No fracture deformity. No dislocation. Large plantar calcaneal spur. No destructive bony lesions. Advanced vascular calcifications with sheet like calcifications in included tibia and fibula soft tissues. IMPRESSION: 1. No acute fracture deformity or dislocation. 2. Status post first transmetatarsal amputation with stable appearance, no radiographic findings of osteomyelitis. Electronically Signed   By: Elon Alas M.D.   On: 12/01/2018 04:22    Procedures Procedures (including critical care time)  Medications Ordered in ED Medications  oxyCODONE (Oxy IR/ROXICODONE) immediate release tablet 5 mg (has no administration in time range)  lidocaine (LIDODERM) 5 % 1 patch (has no administration in time range)  acetaminophen (TYLENOL) tablet 1,000 mg (has no administration in time range)     Initial Impression / Assessment and Plan / ED  Course  I have reviewed the triage vital signs and the nursing notes.  Pertinent labs & imaging results that were available during my care of the patient were reviewed by me and considered in my medical decision making (see chart for details).     [name] is a 42 year old female history of bipolar, diabetes, recent right toe amputation.  Patient currently on antibiotics.  Patient with unremarkable vitals.  Patient seen here yesterday for pain in the right foot.  She was unable to fill her opioid prescription for pain.  She has not been able to take lidocaine patch as well.  She has used Tylenol intermittently without much relief.  She continues on antibiotics.  Pain has been ongoing for the last several weeks ever since amputation.  She has wound care, and several times a week.  She has follow-up with surgeon next week.  No signs of worsening infection on exam.  X-rays done yesterday morning showed no acute changes.  Patient denies any new trauma.  Patient was given Roxicodone, lidocaine patch, Tylenol while in the ED.  Encouraged to fill her prescriptions and discharged from the ED in good condition.  This chart was dictated using voice recognition software.  Despite best efforts to proofread,  errors can occur which can change the documentation meaning.   Final Clinical Impressions(s) / ED Diagnoses   Final diagnoses:  Toe pain, right    ED Discharge Orders    None       Lennice Sites, DO 12/02/18 [phone]

## 2018-12-03 DIAGNOSIS — E1140 Type 2 diabetes mellitus with diabetic neuropathy, unspecified: Secondary | ICD-10-CM | POA: Diagnosis not present

## 2018-12-04 ENCOUNTER — Other Ambulatory Visit: Payer: Self-pay

## 2018-12-04 ENCOUNTER — Inpatient Hospital Stay (HOSPITAL_COMMUNITY)
Admission: EM | Admit: 2018-12-04 | Discharge: 2018-12-06 | DRG: 603 | Disposition: A | Discharge status: Other Acute Inpatient Hospital | Payer: Medicaid Other | Attending: Family Medicine | Admitting: Family Medicine

## 2018-12-04 ENCOUNTER — Encounter (HOSPITAL_COMMUNITY): Payer: Self-pay | Admitting: Emergency Medicine

## 2018-12-04 DIAGNOSIS — E785 Hyperlipidemia, unspecified: Secondary | ICD-10-CM | POA: Diagnosis present

## 2018-12-04 DIAGNOSIS — E559 Vitamin D deficiency, unspecified: Secondary | ICD-10-CM | POA: Diagnosis present

## 2018-12-04 DIAGNOSIS — E1140 Type 2 diabetes mellitus with diabetic neuropathy, unspecified: Secondary | ICD-10-CM | POA: Diagnosis not present

## 2018-12-04 DIAGNOSIS — E119 Type 2 diabetes mellitus without complications: Secondary | ICD-10-CM | POA: Diagnosis not present

## 2018-12-04 DIAGNOSIS — IMO0002 Reserved for concepts with insufficient information to code with codable children: Secondary | ICD-10-CM

## 2018-12-04 DIAGNOSIS — Z8349 Family history of other endocrine, nutritional and metabolic diseases: Secondary | ICD-10-CM

## 2018-12-04 DIAGNOSIS — Z833 Family history of diabetes mellitus: Secondary | ICD-10-CM

## 2018-12-04 DIAGNOSIS — Z89512 Acquired absence of left leg below knee: Secondary | ICD-10-CM

## 2018-12-04 DIAGNOSIS — E1151 Type 2 diabetes mellitus with diabetic peripheral angiopathy without gangrene: Secondary | ICD-10-CM

## 2018-12-04 DIAGNOSIS — J449 Chronic obstructive pulmonary disease, unspecified: Secondary | ICD-10-CM | POA: Diagnosis present

## 2018-12-04 DIAGNOSIS — L089 Local infection of the skin and subcutaneous tissue, unspecified: Secondary | ICD-10-CM | POA: Diagnosis not present

## 2018-12-04 DIAGNOSIS — Z79899 Other long term (current) drug therapy: Secondary | ICD-10-CM

## 2018-12-04 DIAGNOSIS — K219 Gastro-esophageal reflux disease without esophagitis: Secondary | ICD-10-CM | POA: Diagnosis present

## 2018-12-04 DIAGNOSIS — M869 Osteomyelitis, unspecified: Secondary | ICD-10-CM | POA: Diagnosis present

## 2018-12-04 DIAGNOSIS — E1149 Type 2 diabetes mellitus with other diabetic neurological complication: Secondary | ICD-10-CM

## 2018-12-04 DIAGNOSIS — Z818 Family history of other mental and behavioral disorders: Secondary | ICD-10-CM

## 2018-12-04 DIAGNOSIS — G8929 Other chronic pain: Secondary | ICD-10-CM | POA: Diagnosis present

## 2018-12-04 DIAGNOSIS — G4733 Obstructive sleep apnea (adult) (pediatric): Secondary | ICD-10-CM | POA: Diagnosis present

## 2018-12-04 DIAGNOSIS — Z794 Long term (current) use of insulin: Secondary | ICD-10-CM

## 2018-12-04 DIAGNOSIS — R52 Pain, unspecified: Secondary | ICD-10-CM | POA: Diagnosis not present

## 2018-12-04 DIAGNOSIS — Z7951 Long term (current) use of inhaled steroids: Secondary | ICD-10-CM

## 2018-12-04 DIAGNOSIS — E1165 Type 2 diabetes mellitus with hyperglycemia: Secondary | ICD-10-CM | POA: Diagnosis not present

## 2018-12-04 DIAGNOSIS — Z6841 Body Mass Index (BMI) 40.0 and over, adult: Secondary | ICD-10-CM

## 2018-12-04 DIAGNOSIS — I10 Essential (primary) hypertension: Secondary | ICD-10-CM | POA: Diagnosis present

## 2018-12-04 DIAGNOSIS — G2581 Restless legs syndrome: Secondary | ICD-10-CM | POA: Diagnosis present

## 2018-12-04 DIAGNOSIS — L03115 Cellulitis of right lower limb: Principal | ICD-10-CM | POA: Diagnosis present

## 2018-12-04 DIAGNOSIS — Z87891 Personal history of nicotine dependence: Secondary | ICD-10-CM

## 2018-12-04 DIAGNOSIS — F3181 Bipolar II disorder: Secondary | ICD-10-CM | POA: Diagnosis present

## 2018-12-04 DIAGNOSIS — Z89411 Acquired absence of right great toe: Secondary | ICD-10-CM

## 2018-12-04 HISTORY — DX: Obstructive sleep apnea (adult) (pediatric): G47.33

## 2018-12-04 HISTORY — DX: Obstructive sleep apnea (adult) (pediatric): Z99.89

## 2018-12-04 HISTORY — DX: Type 2 diabetes mellitus with hyperglycemia: E11.65

## 2018-12-04 MED ORDER — OXYCODONE-ACETAMINOPHEN 5-325 MG PO TABS
1.0000 | ORAL_TABLET | ORAL | Status: AC | PRN
  Administered 2018-12-04 – 2018-12-05 (×2): 1 via ORAL
  Filled 2018-12-04 (×2): qty 1

## 2018-12-04 NOTE — ED Triage Notes (Signed)
Pt BIB [facility] for right foot pain. Pt had a large toe amputation prior to thanksgiving and has been having dressing changed M,W,F by home health. Pt experienced increased pain on Saturday and was evaluated at [facility] where they wrapped her foot in coband. Pt states RN was suppose to come change bandage at home today but did not show. She unwrapped her foot and noticed it was blue and increased pain

## 2018-12-05 ENCOUNTER — Inpatient Hospital Stay (HOSPITAL_COMMUNITY): Payer: Medicaid Other

## 2018-12-05 ENCOUNTER — Emergency Department (HOSPITAL_COMMUNITY): Payer: Medicaid Other

## 2018-12-05 ENCOUNTER — Encounter (HOSPITAL_COMMUNITY): Payer: Self-pay | Admitting: General Practice

## 2018-12-05 DIAGNOSIS — E559 Vitamin D deficiency, unspecified: Secondary | ICD-10-CM | POA: Diagnosis present

## 2018-12-05 DIAGNOSIS — M86671 Other chronic osteomyelitis, right ankle and foot: Secondary | ICD-10-CM

## 2018-12-05 DIAGNOSIS — M869 Osteomyelitis, unspecified: Secondary | ICD-10-CM | POA: Diagnosis present

## 2018-12-05 DIAGNOSIS — E785 Hyperlipidemia, unspecified: Secondary | ICD-10-CM | POA: Diagnosis present

## 2018-12-05 DIAGNOSIS — F3181 Bipolar II disorder: Secondary | ICD-10-CM | POA: Diagnosis present

## 2018-12-05 DIAGNOSIS — Z87891 Personal history of nicotine dependence: Secondary | ICD-10-CM | POA: Diagnosis not present

## 2018-12-05 DIAGNOSIS — R600 Localized edema: Secondary | ICD-10-CM | POA: Diagnosis not present

## 2018-12-05 DIAGNOSIS — I739 Peripheral vascular disease, unspecified: Secondary | ICD-10-CM | POA: Diagnosis not present

## 2018-12-05 DIAGNOSIS — E1151 Type 2 diabetes mellitus with diabetic peripheral angiopathy without gangrene: Secondary | ICD-10-CM | POA: Diagnosis not present

## 2018-12-05 DIAGNOSIS — I10 Essential (primary) hypertension: Secondary | ICD-10-CM | POA: Diagnosis present

## 2018-12-05 DIAGNOSIS — L089 Local infection of the skin and subcutaneous tissue, unspecified: Secondary | ICD-10-CM

## 2018-12-05 DIAGNOSIS — E1165 Type 2 diabetes mellitus with hyperglycemia: Secondary | ICD-10-CM

## 2018-12-05 DIAGNOSIS — Z89411 Acquired absence of right great toe: Secondary | ICD-10-CM | POA: Diagnosis not present

## 2018-12-05 DIAGNOSIS — Z79899 Other long term (current) drug therapy: Secondary | ICD-10-CM | POA: Diagnosis not present

## 2018-12-05 DIAGNOSIS — E119 Type 2 diabetes mellitus without complications: Secondary | ICD-10-CM | POA: Diagnosis not present

## 2018-12-05 DIAGNOSIS — M79671 Pain in right foot: Secondary | ICD-10-CM | POA: Diagnosis not present

## 2018-12-05 DIAGNOSIS — Z6841 Body Mass Index (BMI) 40.0 and over, adult: Secondary | ICD-10-CM

## 2018-12-05 DIAGNOSIS — S99921A Unspecified injury of right foot, initial encounter: Secondary | ICD-10-CM | POA: Diagnosis not present

## 2018-12-05 DIAGNOSIS — E1140 Type 2 diabetes mellitus with diabetic neuropathy, unspecified: Secondary | ICD-10-CM | POA: Diagnosis not present

## 2018-12-05 DIAGNOSIS — Z8349 Family history of other endocrine, nutritional and metabolic diseases: Secondary | ICD-10-CM | POA: Diagnosis not present

## 2018-12-05 DIAGNOSIS — G8929 Other chronic pain: Secondary | ICD-10-CM | POA: Diagnosis present

## 2018-12-05 DIAGNOSIS — Z8639 Personal history of other endocrine, nutritional and metabolic disease: Secondary | ICD-10-CM | POA: Diagnosis not present

## 2018-12-05 DIAGNOSIS — L03115 Cellulitis of right lower limb: Secondary | ICD-10-CM | POA: Diagnosis present

## 2018-12-05 DIAGNOSIS — G2581 Restless legs syndrome: Secondary | ICD-10-CM | POA: Diagnosis present

## 2018-12-05 DIAGNOSIS — X58XXXA Exposure to other specified factors, initial encounter: Secondary | ICD-10-CM | POA: Diagnosis not present

## 2018-12-05 DIAGNOSIS — M86171 Other acute osteomyelitis, right ankle and foot: Secondary | ICD-10-CM | POA: Diagnosis not present

## 2018-12-05 DIAGNOSIS — Z7951 Long term (current) use of inhaled steroids: Secondary | ICD-10-CM | POA: Diagnosis not present

## 2018-12-05 DIAGNOSIS — Z7401 Bed confinement status: Secondary | ICD-10-CM | POA: Diagnosis not present

## 2018-12-05 DIAGNOSIS — Z89512 Acquired absence of left leg below knee: Secondary | ICD-10-CM | POA: Diagnosis not present

## 2018-12-05 DIAGNOSIS — Z833 Family history of diabetes mellitus: Secondary | ICD-10-CM | POA: Diagnosis not present

## 2018-12-05 DIAGNOSIS — Z794 Long term (current) use of insulin: Secondary | ICD-10-CM | POA: Diagnosis not present

## 2018-12-05 DIAGNOSIS — K219 Gastro-esophageal reflux disease without esophagitis: Secondary | ICD-10-CM | POA: Diagnosis present

## 2018-12-05 DIAGNOSIS — Z818 Family history of other mental and behavioral disorders: Secondary | ICD-10-CM | POA: Diagnosis not present

## 2018-12-05 DIAGNOSIS — J449 Chronic obstructive pulmonary disease, unspecified: Secondary | ICD-10-CM | POA: Diagnosis present

## 2018-12-05 DIAGNOSIS — G4733 Obstructive sleep apnea (adult) (pediatric): Secondary | ICD-10-CM | POA: Diagnosis present

## 2018-12-05 LAB — I-STAT BETA HCG BLOOD, ED (MC, WL, AP ONLY): I-stat hCG, quantitative: <5 m[IU]/mL (ref <5)

## 2018-12-05 LAB — CBC WITH DIFFERENTIAL/PLATELET
Abs Immature Granulocytes: 0.12 10*3/uL — ABNORMAL HIGH (ref 0.00–0.07)
Basophils Absolute: 0 10*3/uL (ref 0.0–0.1)
Basophils Relative: 0 %
Eosinophils Absolute: 0.1 10*3/uL (ref 0.0–0.5)
Eosinophils Relative: 1 %
HCT: 32.3 % — ABNORMAL LOW (ref 36.0–46.0)
Hemoglobin: 10.3 g/dL — ABNORMAL LOW (ref 12.0–15.0)
Immature Granulocytes: 1 %
Lymphocytes Relative: 18 %
Lymphs Abs: 2.7 10*3/uL (ref 0.7–4.0)
MCH: 28.1 pg (ref 26.0–34.0)
MCHC: 31.9 g/dL (ref 30.0–36.0)
MCV: 88.3 fL (ref 80.0–100.0)
Monocytes Absolute: 1.2 10*3/uL — ABNORMAL HIGH (ref 0.1–1.0)
Monocytes Relative: 8 %
Neutro Abs: 10.8 10*3/uL — ABNORMAL HIGH (ref 1.7–7.7)
Neutrophils Relative %: 72 %
Platelets: 365 10*3/uL (ref 150–400)
RBC: 3.66 MIL/uL — ABNORMAL LOW (ref 3.87–5.11)
RDW: 12.1 % (ref 11.5–15.5)
WBC: 15 10*3/uL — ABNORMAL HIGH (ref 4.0–10.5)
nRBC: 0 % (ref 0.0–0.2)

## 2018-12-05 LAB — BASIC METABOLIC PANEL
Anion gap: 11 (ref 5–15)
BUN: 10 mg/dL (ref 6–20)
CO2: 25 mmol/L (ref 22–32)
Calcium: 8.4 mg/dL — ABNORMAL LOW (ref 8.9–10.3)
Chloride: 97 mmol/L — ABNORMAL LOW (ref 98–111)
Creatinine, Ser: 0.95 mg/dL (ref 0.44–1.00)
GFR calc Af Amer: >60 mL/min (ref >60)
GFR calc non Af Amer: >60 mL/min (ref >60)
Glucose, Bld: 355 mg/dL — ABNORMAL HIGH (ref 70–99)
Potassium: 3.9 mmol/L (ref 3.5–5.1)
Sodium: 133 mmol/L — ABNORMAL LOW (ref 135–145)

## 2018-12-05 LAB — I-STAT CG4 LACTIC ACID, ED: Lactic Acid, Venous: 1.83 mmol/L (ref 0.5–1.9)

## 2018-12-05 LAB — CBG MONITORING, ED
Glucose-Capillary: 343 mg/dL — ABNORMAL HIGH (ref 70–99)
Glucose-Capillary: 296 mg/dL — ABNORMAL HIGH (ref 70–99)

## 2018-12-05 LAB — GLUCOSE, CAPILLARY
Glucose-Capillary: 308 mg/dL — ABNORMAL HIGH (ref 70–99)
Glucose-Capillary: 220 mg/dL — ABNORMAL HIGH (ref 70–99)

## 2018-12-05 MED ORDER — VANCOMYCIN HCL IN DEXTROSE 1-5 GM/200ML-% IV SOLN
1000.0000 mg | Freq: Once | INTRAVENOUS | Status: AC
  Administered 2018-12-05: 1000 mg via INTRAVENOUS
  Filled 2018-12-05: qty 200

## 2018-12-05 MED ORDER — INSULIN ASPART 100 UNIT/ML ~~LOC~~ SOLN: 0.0000 [IU] | Freq: Every day | SUBCUTANEOUS | Status: DC

## 2018-12-05 MED ORDER — MORPHINE SULFATE (PF) 2 MG/ML IV SOLN
1.0000 mg | INTRAVENOUS | Status: DC | PRN
  Administered 2018-12-05 – 2018-12-06 (×5): 1 mg via INTRAVENOUS
  Filled 2018-12-05 (×6): qty 1

## 2018-12-05 MED ORDER — SODIUM CHLORIDE 0.9 % IV BOLUS
500.0000 mL | Freq: Once | INTRAVENOUS | Status: AC
  Administered 2018-12-05: 500 mL via INTRAVENOUS

## 2018-12-05 MED ORDER — ATORVASTATIN CALCIUM 10 MG PO TABS
20.0000 mg | ORAL_TABLET | Freq: Every day | ORAL | Status: DC
  Administered 2018-12-05 – 2018-12-06 (×2): 20 mg via ORAL
  Filled 2018-12-05 (×3): qty 2

## 2018-12-05 MED ORDER — OXYCODONE HCL 5 MG PO TABS
5.0000 mg | ORAL_TABLET | ORAL | Status: DC | PRN
  Administered 2018-12-05 – 2018-12-06 (×5): 5 mg via ORAL
  Filled 2018-12-05 (×5): qty 1

## 2018-12-05 MED ORDER — INSULIN ASPART 100 UNIT/ML ~~LOC~~ SOLN
0.0000 [IU] | Freq: Three times a day (TID) | SUBCUTANEOUS | Status: DC
  Administered 2018-12-05: 5 [IU] via SUBCUTANEOUS
  Administered 2018-12-05: 7 [IU] via SUBCUTANEOUS

## 2018-12-05 MED ORDER — HYDROMORPHONE HCL 1 MG/ML IJ SOLN
1.0000 mg | Freq: Once | INTRAMUSCULAR | Status: AC
  Administered 2018-12-05: 1 mg via INTRAVENOUS
  Filled 2018-12-05: qty 1

## 2018-12-05 MED ORDER — LISINOPRIL 2.5 MG PO TABS
2.5000 mg | ORAL_TABLET | Freq: Every day | ORAL | Status: DC
  Administered 2018-12-05 – 2018-12-06 (×2): 2.5 mg via ORAL
  Filled 2018-12-05 (×3): qty 1

## 2018-12-05 MED ORDER — MOMETASONE FURO-FORMOTEROL FUM 200-5 MCG/ACT IN AERO
2.0000 | INHALATION_SPRAY | Freq: Two times a day (BID) | RESPIRATORY_TRACT | Status: DC
  Filled 2018-12-05: qty 8.8

## 2018-12-05 MED ORDER — ACETAMINOPHEN 650 MG RE SUPP: 650.0000 mg | Freq: Four times a day (QID) | RECTAL | Status: DC | PRN

## 2018-12-05 MED ORDER — PANTOPRAZOLE SODIUM 40 MG PO TBEC
40.0000 mg | DELAYED_RELEASE_TABLET | Freq: Every day | ORAL | Status: DC
  Administered 2018-12-05 – 2018-12-06 (×2): 40 mg via ORAL
  Filled 2018-12-05 (×2): qty 1

## 2018-12-05 MED ORDER — PIPERACILLIN-TAZOBACTAM 3.375 G IVPB
3.3750 g | Freq: Three times a day (TID) | INTRAVENOUS | Status: DC
  Administered 2018-12-05 – 2018-12-06 (×4): 3.375 g via INTRAVENOUS
  Filled 2018-12-05 (×6): qty 50

## 2018-12-05 MED ORDER — ONDANSETRON HCL 4 MG PO TABS
4.0000 mg | ORAL_TABLET | Freq: Three times a day (TID) | ORAL | Status: DC | PRN
  Administered 2018-12-05: 4 mg via ORAL
  Filled 2018-12-05: qty 1

## 2018-12-05 MED ORDER — INSULIN ASPART 100 UNIT/ML ~~LOC~~ SOLN
7.0000 [IU] | Freq: Three times a day (TID) | SUBCUTANEOUS | Status: DC
  Administered 2018-12-05: 7 [IU] via SUBCUTANEOUS

## 2018-12-05 MED ORDER — POLYETHYLENE GLYCOL 3350 17 G PO PACK: 17.0000 g | PACK | Freq: Every day | ORAL | Status: DC | PRN

## 2018-12-05 MED ORDER — INSULIN ASPART 100 UNIT/ML ~~LOC~~ SOLN
0.0000 [IU] | Freq: Three times a day (TID) | SUBCUTANEOUS | Status: DC
  Administered 2018-12-05: 11 [IU] via SUBCUTANEOUS

## 2018-12-05 MED ORDER — ALBUTEROL SULFATE (2.5 MG/3ML) 0.083% IN NEBU: 3.0000 mL | INHALATION_SOLUTION | Freq: Four times a day (QID) | RESPIRATORY_TRACT | Status: DC | PRN

## 2018-12-05 MED ORDER — VITAMIN D 25 MCG (1000 UNIT) PO TABS
1000.0000 [IU] | ORAL_TABLET | Freq: Every day | ORAL | Status: DC
  Administered 2018-12-05 – 2018-12-06 (×2): 1000 [IU] via ORAL
  Filled 2018-12-05 (×3): qty 1

## 2018-12-05 MED ORDER — HEPARIN SODIUM (PORCINE) 5000 UNIT/ML IJ SOLN
5000.0000 [IU] | Freq: Three times a day (TID) | INTRAMUSCULAR | Status: DC
  Administered 2018-12-05 (×2): 5000 [IU] via SUBCUTANEOUS
  Filled 2018-12-05 (×3): qty 1

## 2018-12-05 MED ORDER — ACETAMINOPHEN 325 MG PO TABS: 650.0000 mg | ORAL_TABLET | Freq: Four times a day (QID) | ORAL | Status: DC | PRN

## 2018-12-05 MED ORDER — INSULIN GLARGINE 100 UNIT/ML ~~LOC~~ SOLN
30.0000 [IU] | Freq: Two times a day (BID) | SUBCUTANEOUS | Status: DC
  Administered 2018-12-05 – 2018-12-06 (×2): 30 [IU] via SUBCUTANEOUS
  Filled 2018-12-05 (×2): qty 0.3

## 2018-12-05 MED ORDER — PIPERACILLIN-TAZOBACTAM 3.375 G IVPB 30 MIN
3.3750 g | Freq: Once | INTRAVENOUS | Status: AC
  Administered 2018-12-05: 3.375 g via INTRAVENOUS
  Filled 2018-12-05: qty 50

## 2018-12-05 MED ORDER — GABAPENTIN 600 MG PO TABS
1200.0000 mg | ORAL_TABLET | Freq: Three times a day (TID) | ORAL | Status: DC
  Administered 2018-12-05 – 2018-12-06 (×3): 1200 mg via ORAL
  Filled 2018-12-05 (×3): qty 2

## 2018-12-05 MED ORDER — VANCOMYCIN HCL IN DEXTROSE 1-5 GM/200ML-% IV SOLN
1000.0000 mg | Freq: Two times a day (BID) | INTRAVENOUS | Status: DC
  Administered 2018-12-05 – 2018-12-06 (×2): 1000 mg via INTRAVENOUS
  Filled 2018-12-05 (×2): qty 200

## 2018-12-05 NOTE — ED Notes (Signed)
PAGED ADMITTING PER RN  

## 2018-12-05 NOTE — ED Notes (Signed)
Patient transported to X-ray 

## 2018-12-05 NOTE — ED Notes (Addendum)
Pt complains of pain to right foot in area where big toe was amputated 1 month ago. Redness going up the foot which the pt states is new. Pt went to ER twice over the weekend for same. States it is getting worse. Wound has foul smell. Pt is tachy and 100.3 oral temp.

## 2018-12-05 NOTE — Progress Notes (Signed)
Pharmacy Antibiotic Note  [name] [name] is a 42 y.o. female admitted on 12/04/2018 with wound infection. Pharmacy has been consulted for vancomycin and zosyn dosing. Pt is afebrile and WBC is elevated at 15. SCr is WNL at 0.95. First doses already given.   Plan: Give additional vancomycin 1gm for loading dose of 2gm then 1gm IV Q12H Zosyn 3.375gm IV Q8H (4 hr inf) F/u renal fxn, C&S, clinical status and peak/trough at SS  Height: 5\' 2"  (157.5 cm) Weight: 292 lb (132.5 kg) IBW/kg (Calculated) : 50.1  Temp (24hrs), Avg:98.9 F (37.2 C), Min:98.4 F (36.9 C), Max:99.4 F (37.4 C)  Recent Labs  Lab 12/05/18 0149 12/05/18 0236  WBC 15.0*  --   CREATININE 0.95  --   LATICACIDVEN  --  1.83    Estimated Creatinine Clearance: 103.3 mL/min (by C-G formula based on SCr of 0.95 mg/dL).    Allergies  Allergen Reactions  . Kiwi Extract Shortness Of Breath and Swelling  . Trental [Pentoxifylline] Nausea And Vomiting  . Nubain [Nalbuphine Hcl] Other (See Comments)    "FEELS LIKE SOMETHING CRAWLING ON ME"    Antimicrobials this admission: Vanc 12/31>> Zosyn 12/31>>  Dose adjustments this admission: N/A  Microbiology results: Pending  Thank you for allowing pharmacy to be a part of this patient's care.  Aralynn Brake, Rande Lawman 12/05/2018 8:43 AM

## 2018-12-05 NOTE — H&P (Signed)
[facility] Admission History and Physical Service Pager: [phone]  Patient name: [name] Medical record number: [id number] Date of birth: 1976-11-03 Age: 42 y.o. Gender: female  Primary Care Provider: [name], MD Consultants: [place] Distance Code Status: Full (confirmed on admission)  Chief Complaint: R foot pain  Assessment and Plan: [name] is a 42 y.o. female presenting with R foot pain. PMH is significant for IDT2DM, AR, diabetic neuropathy, Vitamin D deficiency, RLS, urge incontinence, HLD, carpal tunnel syndrome, morbid obesity, OHS, depression, OSA, HTN, asthma, GERD.  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 Augmentin s/p surgery until 12/29 (wound culture from surgery showed polymicrobial infection with Candida glabrata).  Patient has been seen multiple times in ED for surgery for pain control.  Foot XR in ED showed no signs of osteomyelitis, however has not had MRI as of yet.  Unable to probe wound during admission due to significant patient discomfort.  Wound appears sloughing with erythema and bluish discoloration surrounding, no streaks noted.  Labs in ED notable for hyperglycemia to 130, neutrophilic leukocytosis without lactic acidosis.  Vitals not convincing for systemic inflammation or infection.  IV antibiotics started in ED.  ED provider discussed admission to [place] with [place], however was told there are currently no beds available at [facility].  [facility] requested admission to [facility] for IV antibiotics with subsequent transfer to [facility] for possible surgical management. - Admit to [facility], attending Dr. Nori Riis - Continue IV Vanc/Zosyn (12/31- ) - MRI R foot w/&w/o contrast - OxyIR 5 mg every 4 hours as needed for moderate pain - Morphine 1 mg every 4 hours as needed for severe  pain - Call [facility] to make aware of admission and clarify transfer.  T2DM Takes Insulin 70/30 60u in am, 40u in pm at home, only missed doses while in ED. A1c 9.6 04/2018, patient reports most recent 14. 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 Patient states she was taken off of metformin while at [facility] for surgery. - A1c - Lantus 30u BID - Novolog 7u TID AC - mSSI - CBG AC & qhs  HTN Normotensive on admission, 116/79.  Takes lisinopril 2.5 mg daily at home. - Continue home meds  HLD Takes Lipitor 20 mg at home. - Continue home meds  GERD Takes Protonix 40 mg daily at home.  Currently asymptomatic. -Continue home meds  OHS/OSA Uses cpap at night. -CPAP nightly  Asthma Take Symbicort 2 puffs twice daily with as needed albuterol.  Last use albuterol months ago.  No current complaints of difficulty breathing.   -Continue home meds  Depression Noted on chart review, last admission was evaluated by psychiatry who recommended citalopram which patient declined due to making her feel nauseous.  At that time was recommended for outpatient psych follow-up.  Currently not on medication management.  Currently denies difficulty.  -Monitor -Consider initiation of SSRI should concerns arise  RLS Takes gabapentin 1200 mg 3 times daily. -Continue home meds  FEN/GI: HH/Carb modified Prophylaxis: heparin  Disposition: admit to med-surg, attending Dr. Nori Riis  History of Present Illness:  [name] is a 42 y.o. female presenting with R foot pain since Friday. R great toe amputation 11/27 at [facility], states after surgery she has noticed redness around her toes. She has  been seen by Dr. Bonnita Nasuti on 12/11 who reassured her everything looked fine. She is due for f/u 1/10 to have wound closed up. She has been getting [facility] wound nursing every MWF. On Friday night, she states her foot began hurting so bad, she came to the ED (ED note states she fell from wheelchair  transfer) and was told her foot showed no signs of infection. She returned to the ED Saturday due to pain and was sent home after having her foot rewrapped. Yesterday she was due for a dressing change by [facility] wound nursing but they did not come out, she is unsure why (phone encounter from 12/27 states home health frequency being decreased to once weekly). She and her daughter changed the dressing themselves and noticed bluish discoloration to the top of her foot which was new. Due to new discoloration and continued pain, she presented to the ED last night. She denies fevers, chills, feeling unwell. Denies missed doses of insulin apart from when she was in the ED. Was told last night in ED that ED provider noticed streaking up her R leg. Last ate last night at 9-10pm. Lives at home with her daughter and gets around using wheelchair, does have a prosthetic leg for her L BKA.  In the ED, received oxy and diluadid for pain control. Foot XR without signs of OM. VSS apart from tachycardia, started on IV Vanc/Zosyn.  Review Of Systems: Per HPI with the following additions:   Review of Systems  Constitutional: Negative for chills, diaphoresis, fever and malaise/fatigue.  HENT: Negative for congestion and sore throat.   Eyes: Negative for blurred vision and double vision.  Respiratory: Negative for cough and shortness of breath.   Cardiovascular: Negative for chest pain, palpitations and leg swelling.  Gastrointestinal: Negative for abdominal pain, blood in stool, constipation, diarrhea, nausea and vomiting.  Genitourinary: Negative for dysuria and hematuria.  Musculoskeletal: Negative for back pain.  Skin: Negative for rash.  Neurological: Negative for dizziness and headaches.    Patient Active Problem List   Diagnosis Date Noted  . Osteomyelitis ([place]) 12/05/2018  . Cellulitis of great toe of right foot 10/29/2018  . Osteomyelitis of great toe of right foot ([place]) 10/29/2018  . Vulvovaginitis 10/23/2018   . Encounter for orthopedic aftercare following surgical amputation 06/28/2018  . Uncontrolled type 2 diabetes mellitus with insulin therapy ([place]) 04/16/2018  . Amputation stump infection ([place]) 04/09/2018  . Allergic rhinitis 03/06/2018  . Unilateral complete BKA, left, sequela ([place])   . Class 3 severe obesity due to excess calories with serious comorbidity and body mass index (BMI) of 50.0 to 59.9 in adult [facility])   . Decreased pedal pulses 06/10/2017  . Unilateral primary osteoarthritis, right knee 10/22/2016  . Primary osteoarthritis of first carpometacarpal joint of left hand 07/30/2016  . Diabetic neuropathy ([place]) 07/14/2016  . Syncope 02/25/2016  . De Quervain's tenosynovitis, bilateral 11/01/2015  . Vitamin D deficiency 09/05/2015  . Recurrent candidiasis of vagina 09/05/2015  . Varicose veins of leg with complications 09/98/3382  . Restless leg syndrome 10/17/2014  . Chronic sinusitis 07/18/2014  . Headache 07/15/2014  . Urge incontinence 10/15/2013  . Encounter for chronic pain management 06/30/2013  . HLD (hyperlipidemia) 11/19/2012  . Chest pain 06/27/2012  . Right carpal tunnel syndrome 09/01/2011  . Bilateral knee pain 09/01/2011  . DM (diabetes mellitus) type II uncontrolled, periph vascular disorder ([place]) 05/22/2008  . Morbid obesity ([place]) 05/22/2008  . 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 Depression 05/22/2008  .  Obstructive sleep apnea 05/22/2008  . Hypertension 05/22/2008  . Asthma 05/22/2008  . GERD 05/22/2008    Past Medical History: Past Medical History:  Diagnosis Date  . Acute osteomyelitis of ankle or foot, left ([place]) 01/31/2018  . Alveolar hypoventilation   . Anemia    not on iron pill  . Asthma   . Bipolar 2 disorder ([place])   . Carpal tunnel syndrome on right    recurrent  . Cellulitis 08/2010-08/2011  . Chronic pain   . COPD (chronic obstructive pulmonary disease) ([name])    Symbicort daily and Proventil as needed  . Costochondritis   .  Depression   . Diabetes mellitus 2000   Type 2, Uncontrolled.Takes Lantus daily.Fasting blood sugar runs 150  . Dizziness    occasionally  . Drug-seeking behavior   . GERD (gastroesophageal reflux disease)    takes Pantoprazole and Zantac daily  . Headache    migraine-last one about a yr ago.Topamax daily  . HLD (hyperlipidemia)    takes Atorvastatin daily  . Hypertension    takes Lisinopril and Coreg daily  . Morbid obesity ([place])   . Muscle spasm    takes Flexeril as needed  . Nocturia   . Obstructive sleep apnea   . Peripheral neuropathy    takes Gabapentin daily  . Pneumonia    "walking" several yrs ago and as a baby  . Rectal fissure   . Restless leg   . Urinary frequency   . Varicose veins    Right medial thigh and Left leg     Past Surgical History: Past Surgical History:  Procedure Laterality Date  . AMPUTATION Left 02/01/2018   Procedure: LEFT FOURTH AND 5TH TOE RAY AMPUTATION;  Surgeon: [name], MD;  Location: [facility];  Service: Orthopedics;  Laterality: Left;  . AMPUTATION Left 03/03/2018   Procedure: LEFT BELOW KNEE AMPUTATION;  Surgeon: [name], MD;  Location: [place];  Service: Orthopedics;  Laterality: Left;  . CARPAL TUNNEL RELEASE Bilateral   . CESAREAN SECTION    . KNEE ARTHROSCOPY Right 07/17/2010  . LEFT HEART CATHETERIZATION WITH CORONARY ANGIOGRAM N/A 07/27/2012   Procedure: LEFT HEART CATHETERIZATION WITH CORONARY ANGIOGRAM;  Surgeon: Sherren Mocha, MD;  Location: [facility] CATH LAB;  Service: Cardiovascular;  Laterality: N/A;  . left knee surgery     screws she thinks  . MASS EXCISION N/A 06/29/2013   Procedure:  WIDE LOCAL EXCISION OF POSTERIOR NECK ABSCESS;  Surgeon: Ralene Ok, MD;  Location: [place];  Service: General;  Laterality: N/A;    Social History: Social History   Tobacco Use  . Smoking status: Former Smoker    Packs/day: 0.30    Years: 0.30    Pack years: 0.09    Types: Cigarettes    Last attempt to quit: 12/06/1993     Years since quitting: 25.0  . Smokeless tobacco: Never Used  Substance Use Topics  . Alcohol use: No    Alcohol/week: 0.0 standard drinks  . Drug use: Yes    Comment: OD attempts on home meds     Additional social history: lives at home with daughter  Please also refer to relevant sections of EMR.  Family History: Family History  Problem Relation Age of Onset  . Diabetes Mother   . Hyperlipidemia Mother   . Depression Mother   . Varicose Veins Mother   . Heart attack Paternal Uncle   . Heart disease Paternal Grandmother   . Heart attack Paternal Grandmother   .  Heart attack Paternal Grandfather   . Heart disease Paternal Grandfather   . Heart attack Father   . Cancer Maternal Grandmother        COLON  . Hypertension Maternal Grandmother   . Hyperlipidemia Maternal Grandmother   . Diabetes Maternal Grandmother   . Other Maternal Grandfather        GUN SHOT    Allergies and Medications: Allergies  Allergen Reactions  . Kiwi Extract Shortness Of Breath and Swelling  . Trental [Pentoxifylline] Nausea And Vomiting  . Nubain [Nalbuphine Hcl] Other (See Comments)    "FEELS LIKE SOMETHING CRAWLING ON ME"   No current facility-administered medications on file prior to encounter.    Current Outpatient Medications on File Prior to Encounter  Medication Sig Dispense Refill  . albuterol (PROVENTIL HFA;VENTOLIN HFA) 108 (90 Base) MCG/ACT inhaler Inhale 1-2 puffs into the lungs every 6 (six) hours as needed for wheezing or shortness of breath. 18 g 5  . atorvastatin (LIPITOR) 20 MG tablet Take 1 tablet (20 mg total) by mouth daily. 30 tablet 5  . budesonide-formoterol (SYMBICORT) 160-4.5 MCG/ACT inhaler inhale 2 PUFFS into THE lungs 2 TIMES DAILY (Patient taking differently: Inhale 2 puffs into the lungs 2 (two) times daily. ) 10.2 g 4  . cetirizine (ZYRTEC) 10 MG tablet Take 1 tablet (10 mg total) by mouth daily. (Patient taking differently: Take 10 mg by mouth daily as needed for  allergies. ) 30 tablet 11  . Cholecalciferol 1000 units tablet Take 1 tablet (1,000 Units total) by mouth daily. 90 tablet 0  . gabapentin (NEURONTIN) 600 MG tablet TAKE 2 TABLETS BY MOUTH 3 TIMES DAILY (Patient taking differently: Take 1,200 mg by mouth 3 (three) times daily. ) 180 tablet 5  . Insulin Isophane & Regular Human (HUMULIN 70/30 KWIKPEN) (70-30) 100 UNIT/ML PEN Inject 40-60 Units into the skin See admin instructions. Use 60 units every morning and use 40 units every evening    . lidocaine (LIDODERM) 5 % Place 1 patch onto the skin daily for 7 days. Remove & Discard patch within 12 hours or as directed by MD 7 patch 0  . lisinopril (PRINIVIL,ZESTRIL) 2.5 MG tablet Take 2.5 mg by mouth daily.    . metFORMIN (GLUCOPHAGE-XR) 500 MG 24 hr tablet Take 500 mg by mouth daily with breakfast.     . oxyCODONE (ROXICODONE) 5 MG immediate release tablet Take 1 tablet (5 mg total) by mouth every 6 (six) hours as needed for breakthrough pain. 10 tablet 0  . pantoprazole (PROTONIX) 40 MG tablet Take 1 tablet (40 mg total) by mouth daily. 30 tablet 1  . ACCU-CHEK SOFTCLIX LANCETS lancets Use as instructed to check blood sugar 3 times per day 100 each 12  . Blood Glucose Monitoring Suppl (ACCU-CHEK NANO SMARTVIEW) w/Device KIT Use to check blood sugar 3 times daily 1 kit 0  . gentamicin cream (GARAMYCIN) 0.1 % Apply 1 application topically 3 (three) times daily. (Patient not taking: Reported on 12/01/2018) 30 g 1  . glucose blood (ACCU-CHEK GUIDE) test strip USE T0 CHECK BLOOD SUGAR 3 TIMES DAILY 100 each 5  . insulin aspart (NOVOLOG) 100 UNIT/ML FlexPen Inject 6 Units into the skin 3 (three) times daily with meals. (Patient not taking: Reported on 12/01/2018) 15 mL 11  . nystatin (MYCOSTATIN/NYSTOP) powder Apply topically 4 (four) times daily. (Patient not taking: Reported on 12/01/2018) 60 g 2  . nystatin cream (MYCOSTATIN) Apply 1 application topically 2 (two) times daily. (Patient not taking:  Reported  on 12/01/2018) 60 g 2    Objective: BP 112/72   Pulse (!) 111   Temp 99.4 F (37.4 C) (Oral)   Resp 18   Ht _0  (1.575 m)   Wt 132.5 kg   SpO2 94%   BMI 53.41 kg/m  Exam: General: morbidly obese female lying in bed, in no apparent distress Eyes: PERRL ENTM: Mouth clear without erythema or exudate.  Unable to visualize pharynx.  Fair dentition. Neck: Large neck, no cervical adenopathy. Cardiovascular: RRR, no m/r/g Respiratory: CTAB, no wheezing/rales/rhonchi Gastrointestinal: soft, BS abdomen, nontender to palpation MSK: 5/5 strength to UE, intact grip strength. Derm: ~2cm sloughing wound to head of 1st metatarsal, no bony prominence appreciated with surrounding erythema and bluish discoloration to dorsum of foot, unable to probe due to patient discomfort. No bleeding or streaks.  Neuro: alert and oriented, speech normal Psych: Appropriate mood and affect  Labs and Imaging: CBC BMET  Recent Labs  Lab 12/05/18 0149  WBC 15.0*  HGB 10.3*  HCT 32.3*  PLT 365   Recent Labs  Lab 12/05/18 0149  NA 133*  K 3.9  CL 97*  CO2 25  BUN 10  CREATININE 0.95  GLUCOSE 355*  CALCIUM 8.4*     Dg Foot Complete Right  Result Date: 12/05/2018 CLINICAL DATA:  Acute onset of right foot pain. Recent fall. Status post right great toe amputation. Initial encounter. EXAM: RIGHT FOOT COMPLETE - 3+ VIEW COMPARISON:  Right foot radiographs performed 12/01/2018 FINDINGS: The patient is status post amputation at the distal first metatarsal. The remaining first metatarsal is unchanged in appearance. No definite osseous erosions are seen, though evaluation for osteomyelitis is limited on radiograph. The soft tissues are grossly unchanged in appearance, with mild soft tissue swelling at the forefoot. Visualized joint spaces are preserved. Scattered vascular calcifications are seen. A plantar calcaneal spur is noted. IMPRESSION: Status post amputation at the distal first metatarsal, unchanged in  appearance on radiograph. No definite osseous erosions seen, though evaluation for osteomyelitis is limited on radiograph. Electronically Signed   By: Garald Balding M.D.   On: 12/05/2018 02:21   Rory Percy, DO 12/05/2018, 7:27 AM PGY-2, [place] Intern pager: [phone], text pages welcome

## 2018-12-05 NOTE — ED Provider Notes (Signed)
[facility] EMERGENCY DEPARTMENT Provider Note   CSN: [id number] Arrival date & time: 12/04/18  1942     History   Chief Complaint Chief Complaint  Patient presents with  . Toe Pain    HPI [name] is a 42 y.o. female.  HPI Patient presents to the emergency department with worsening right foot pain and swelling.  The patient states that when she removed the dressing today she noticed that she had significant redness swelling and pain to the foot.  Patient states that she had her surgery on November 27.  Patient states that she has been to the ER several times for pain but never had any worsening in the foot.  The patient denies chest pain, shortness of breath, headache,blurred vision, neck pain,  cough, weakness, numbness, dizziness, anorexia, edema, abdominal pain, nausea, vomiting, diarrhea, rash, back pain, dysuria, hematemesis, bloody stool, near syncope, or syncope. Past Medical History:  Diagnosis Date  . Acute osteomyelitis of ankle or foot, left ([place]) 01/31/2018  . Alveolar hypoventilation   . Anemia    not on iron pill  . Asthma   . Bipolar 2 disorder ([place])   . Carpal tunnel syndrome on right    recurrent  . Cellulitis 08/2010-08/2011  . Chronic pain   . COPD (chronic obstructive pulmonary disease) ([name])    Symbicort daily and Proventil as needed  . Costochondritis   . Depression   . Diabetes mellitus 2000   Type 2, Uncontrolled.Takes Lantus daily.Fasting blood sugar runs 150  . Dizziness    occasionally  . Drug-seeking behavior   . GERD (gastroesophageal reflux disease)    takes Pantoprazole and Zantac daily  . Headache    migraine-last one about a yr ago.Topamax daily  . HLD (hyperlipidemia)    takes Atorvastatin daily  . Hypertension    takes Lisinopril and Coreg daily  . Morbid obesity ([place])   . Muscle spasm    takes Flexeril as needed  . Nocturia   . Obstructive sleep apnea   . Peripheral neuropathy    takes Gabapentin daily  .  Pneumonia    "walking" several yrs ago and as a baby  . Rectal fissure   . Restless leg   . Urinary frequency   . Varicose veins    Right medial thigh and Left leg     Patient Active Problem List   Diagnosis Date Noted  . Cellulitis of great toe of right foot 10/29/2018  . Osteomyelitis of great toe of right foot ([place]) 10/29/2018  . Vulvovaginitis 10/23/2018  . Encounter for orthopedic aftercare following surgical amputation 06/28/2018  . Uncontrolled type 2 diabetes mellitus with insulin therapy ([place]) 04/16/2018  . Amputation stump infection ([place]) 04/09/2018  . Allergic rhinitis 03/06/2018  . Unilateral complete BKA, left, sequela ([place])   . Decreased pedal pulses 06/10/2017  . Unilateral primary osteoarthritis, right knee 10/22/2016  . Primary osteoarthritis of first carpometacarpal joint of left hand 07/30/2016  . Diabetic neuropathy ([place]) 07/14/2016  . Syncope 02/25/2016  . De Quervain's tenosynovitis, bilateral 11/01/2015  . Vitamin D deficiency 09/05/2015  . Recurrent candidiasis of vagina 09/05/2015  . Varicose veins of leg with complications 56/81/2751  . Restless leg syndrome 10/17/2014  . Chronic sinusitis 07/18/2014  . Headache 07/15/2014  . Urge incontinence 10/15/2013  . Encounter for chronic pain management 06/30/2013  . HLD (hyperlipidemia) 11/19/2012  . Chest pain 06/27/2012  . Right carpal tunnel syndrome 09/01/2011  . Bilateral knee pain 09/01/2011  .  Diabetes mellitus ([place]) 05/22/2008  . Morbid obesity ([place]) 05/22/2008  . 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 Depression 05/22/2008  . Obstructive sleep apnea 05/22/2008  . Hypertension 05/22/2008  . Asthma 05/22/2008  . GERD 05/22/2008    Past Surgical History:  Procedure Laterality Date  . AMPUTATION Left 02/01/2018   Procedure: LEFT FOURTH AND 5TH TOE RAY AMPUTATION;  Surgeon: [name], MD;  Location: [facility];  Service: Orthopedics;  Laterality: Left;  . AMPUTATION Left 03/03/2018    Procedure: LEFT BELOW KNEE AMPUTATION;  Surgeon: [name], MD;  Location: [facility];  Service: Orthopedics;  Laterality: Left;  . CARPAL TUNNEL RELEASE Bilateral   . CESAREAN SECTION    . KNEE ARTHROSCOPY Right 07/17/2010  . LEFT HEART CATHETERIZATION WITH CORONARY ANGIOGRAM N/A 07/27/2012   Procedure: LEFT HEART CATHETERIZATION WITH CORONARY ANGIOGRAM;  Surgeon: Sherren Mocha, MD;  Location[facility] C[facility];  Service: Cardiovascular;  Laterality: N/A;  . left knee surgery     screws she thinks  . MASS EXCISION N/A 06/29/2013   Procedure:  WIDE LOCAL EXCISION OF POSTERIOR NECK ABSCESS;  Surgeon: Ralene Ok, MD;  Location: [facility];  Service: General;  Laterality: N/A;     OB History    Gravida  2   Para  1   Term      Preterm      AB  1   Living  1     SAB  1   TAB      Ectopic      Multiple      Live Births               Home Medications    Prior to Admission medications   Medication Sig Start Date End Date Taking? Authorizing Provider  albuterol (PROVENTIL HFA;VENTOLIN HFA) 108 (90 Base) MCG/ACT inhaler Inhale 1-2 puffs into the lungs every 6 (six) hours as needed for wheezing or shortness of breath. 01/17/18  Yes [name], MD  atorvastatin (LIPITOR) 20 MG tablet Take 1 tablet (20 mg total) by mouth daily. 03/16/18  Yes Angiulli, Lavon Paganini, PA-C  budesonide-formoterol (SYMBICORT) 160-4.5 MCG/ACT inhaler inhale 2 PUFFS into THE lungs 2 TIMES DAILY Patient taking differently: Inhale 2 puffs into the lungs 2 (two) times daily.  10/10/18  Yes Hensel, Jamal Collin, MD  cetirizine (ZYRTEC) 10 MG tablet Take 1 tablet (10 mg total) by mouth daily. Patient taking differently: Take 10 mg by mouth daily as needed for allergies.  02/28/18  Yes [name], MD  Cholecalciferol 1000 units tablet Take 1 tablet (1,000 Units total) by mouth daily. 04/05/17  Yes [name], MD  gabapentin (NEURONTIN) 600 MG tablet TAKE 2 TABLETS BY MOUTH 3 TIMES DAILY Patient  taking differently: Take 1,200 mg by mouth 3 (three) times daily.  11/22/18  Yes [name], MD  Insulin Isophane & Regular Human (HUMULIN 70/30 KWIKPEN) (70-30) 100 UNIT/ML PEN Inject 40-60 Units into the skin See admin instructions. Use 60 units every morning and use 40 units every evening 11/08/18  Yes [provider]  lidocaine (LIDODERM) 5 % Place 1 patch onto the skin daily for 7 days. Remove & Discard patch within 12 hours or as directed by MD 12/01/18 12/08/18 Yes Duffy Bruce, MD  lisinopril (PRINIVIL,ZESTRIL) 2.5 MG tablet Take 2.5 mg by mouth daily.   Yes [provider]  metFORMIN (GLUCOPHAGE-XR) 500 MG 24 hr tablet Take 500 mg by mouth daily with breakfast.  04/26/18 04/26/19 Yes [provider]  oxyCODONE (ROXICODONE) 5 MG immediate release tablet Take 1 tablet (5 mg total) by mouth every 6 (six) hours as needed for breakthrough pain. 12/01/18  Yes Duffy Bruce, MD  pantoprazole (PROTONIX) 40 MG tablet Take 1 tablet (40 mg total) by mouth daily. 03/16/18  Yes Angiulli, Lavon Paganini, PA-C  ACCU-CHEK SOFTCLIX LANCETS lancets Use as instructed to check blood sugar 3 times per day 03/16/18   Cathlyn Parsons, PA-C  Blood Glucose Monitoring Suppl (ACCU-CHEK NANO SMARTVIEW) w/Device KIT Use to check blood sugar 3 times daily 03/16/18   Angiulli, Lavon Paganini, PA-C  gentamicin cream (GARAMYCIN) 0.1 % Apply 1 application topically 3 (three) times daily. Patient not taking: Reported on 12/01/2018 09/11/18   Edrick Kins, DPM  glucose blood (ACCU-CHEK GUIDE) test strip USE T0 CHECK BLOOD SUGAR 3 TIMES DAILY 03/16/18   Angiulli, Lavon Paganini, PA-C  insulin aspart (NOVOLOG) 100 UNIT/ML FlexPen Inject 6 Units into the skin 3 (three) times daily with meals. Patient not taking: Reported on 12/01/2018 03/16/18   Angiulli, Lavon Paganini, PA-C  nystatin (MYCOSTATIN/NYSTOP) powder Apply topically 4 (four) times daily. Patient not taking: Reported on 12/01/2018 06/20/18   [name], MD  nystatin cream (MYCOSTATIN) Apply 1 application topically 2 (two) times daily. Patient not taking: Reported on 12/01/2018 06/20/18   [name], MD    Family History Family History  Problem Relation Age of Onset  . Diabetes Mother   . Hyperlipidemia Mother   . Depression Mother   . Varicose Veins Mother   . Heart attack Paternal Uncle   . Heart disease Paternal Grandmother   . Heart attack Paternal Grandmother   . Heart attack Paternal Grandfather   . Heart disease Paternal Grandfather   . Heart attack Father   . Cancer Maternal Grandmother        COLON  . Hypertension Maternal Grandmother   . Hyperlipidemia Maternal Grandmother   . Diabetes Maternal Grandmother   . Other Maternal Grandfather        GUN SHOT    Social History Social History   Tobacco Use  . Smoking status: Former Smoker    Packs/day: 0.30    Years: 0.30    Pack years: 0.09    Types: Cigarettes    Last attempt to quit: 12/06/1993    Years since quitting: 25.0  . Smokeless tobacco: Never Used  Substance Use Topics  . Alcohol use: No    Alcohol/week: 0.0 standard drinks  . Drug use: Yes    Comment: OD attempts on home meds       Allergies   Kiwi extract; Trental [pentoxifylline]; and Nubain [nalbuphine hcl]   Review of Systems Review of Systems All other systems negative except as documented in the HPI. All pertinent positives and negatives as reviewed in the HPI.  Physical Exam Updated Vital Signs BP 130/80   Pulse (!) 114   Temp 99.4 F (37.4 C) (Oral)   Resp 18   Ht '5\' 2"'  (1.575 m)   Wt 132.5 kg   SpO2 92%   BMI 53.41 kg/m   Physical Exam Vitals signs and nursing note reviewed.  Constitutional:      General: She is not in acute distress.    Appearance: She is well-developed.  HENT:     Head: Normocephalic and atraumatic.  Eyes:     Pupils: Pupils are equal, round, and reactive to light.  Neck:     Musculoskeletal:  Normal range of motion and neck  supple.  Cardiovascular:     Rate and Rhythm: Normal rate and regular rhythm.     Heart sounds: Normal heart sounds. No murmur. No friction rub. No gallop.   Pulmonary:     Effort: Pulmonary effort is normal. No respiratory distress.     Breath sounds: Normal breath sounds. No wheezing.  Musculoskeletal:       Feet:  Skin:    General: Skin is warm and dry.     Capillary Refill: Capillary refill takes less than 2 seconds.     Findings: No erythema or rash.  Neurological:     Mental Status: She is alert and oriented to person, place, and time.     Motor: No abnormal muscle tone.     Coordination: Coordination normal.  Psychiatric:        Behavior: Behavior normal.      ED Treatments / Results  Labs (all labs ordered are listed, but only abnormal results are displayed) Labs Reviewed  BASIC METABOLIC PANEL - Abnormal; Notable for the following components:      Result Value   Sodium 133 (*)    Chloride 97 (*)    Glucose, Bld 355 (*)    Calcium 8.4 (*)    All other components within normal limits  CBC WITH DIFFERENTIAL/PLATELET - Abnormal; Notable for the following components:   WBC 15.0 (*)    RBC 3.66 (*)    Hemoglobin 10.3 (*)    HCT 32.3 (*)    Neutro Abs 10.8 (*)    Monocytes Absolute 1.2 (*)    Abs Immature Granulocytes 0.12 (*)    All other components within normal limits  I-STAT CG4 LACTIC ACID, ED  I-STAT BETA HCG BLOOD, ED ([facility], [facility], [facility] ONLY)  I-STAT CG4 LACTIC ACID, ED    EKG None  Radiology Dg Foot Complete Right  Result Date: 12/05/2018 CLINICAL DATA:  Acute onset of right foot pain. Recent fall. Status post right great toe amputation. Initial encounter. EXAM: RIGHT FOOT COMPLETE - 3+ VIEW COMPARISON:  Right foot radiographs performed 12/01/2018 FINDINGS: The patient is status post amputation at the distal first metatarsal. The remaining first metatarsal is unchanged in appearance. No definite osseous erosions are seen, though evaluation for osteomyelitis  is limited on radiograph. The soft tissues are grossly unchanged in appearance, with mild soft tissue swelling at the forefoot. Visualized joint spaces are preserved. Scattered vascular calcifications are seen. A plantar calcaneal spur is noted. IMPRESSION: Status post amputation at the distal first metatarsal, unchanged in appearance on radiograph. No definite osseous erosions seen, though evaluation for osteomyelitis is limited on radiograph. Electronically Signed   By: Garald Balding M.D.   On: 12/05/2018 02:21    Procedures Procedures (including critical care time)  Medications Ordered in ED Medications  vancomycin (VANCOCIN) IVPB 1000 mg/200 mL premix (1,000 mg Intravenous New Bag/Given 12/05/18 0557)  piperacillin-tazobactam (ZOSYN) IVPB 3.375 g (3.375 g Intravenous New Bag/Given 12/05/18 0558)  oxyCODONE-acetaminophen (PERCOCET/ROXICET) 5-325 MG per tablet 1 tablet (1 tablet Oral Given 12/05/18 0338)  HYDROmorphone (DILAUDID) injection 1 mg (1 mg Intravenous Given 12/05/18 0153)  HYDROmorphone (DILAUDID) injection 1 mg (1 mg Intravenous Given 12/05/18 0553)     Initial Impression / Assessment and Plan / ED Course  I have reviewed the triage vital signs and the nursing notes.  Pertinent labs & imaging results that were available during my care of the patient were reviewed by me and considered in my  medical decision making (see chart for details).     The patient was attempted to be transferred to [facility] but they do not have space at this time.  I did speak with the surgeon on call for Dr. Tobie Poet who states that they will accept the patient once they have space available.  The patient was started on vancomycin and Zosyn.  Patient may need MRI of the foot pain.  The patient was advised of the plan and all questions were answered.  Final Clinical Impressions(s) / ED Diagnoses   Final diagnoses:  None    ED Discharge Orders    None       Dalia Heading, PA-C 12/05/18 [place], Delice Bison, DO 12/05/18 [phone]

## 2018-12-05 NOTE — ED Notes (Addendum)
Pt's CBG 296. RN notified. Pt was placed on bedpan, tolerated well but was unsuccessful of getting anything out.

## 2018-12-05 NOTE — ED Notes (Signed)
Pt gone to MRI 

## 2018-12-05 NOTE — ED Notes (Signed)
Called [facility] tranfer line--s/w Joy--[name]

## 2018-12-05 NOTE — ED Notes (Signed)
When in to wrap patient foot patient started being very rude and patient hollered at me and told me to get out notified patient RN of how the patient acted

## 2018-12-05 NOTE — Progress Notes (Signed)
Called [facility] transfer center [phone], was told by coordinator that patient is still on waitlist for bed.  No specific timeframe expected. Team can check again in AM  -Dr. Criss Rosales

## 2018-12-05 NOTE — ED Notes (Signed)
RN paged Dr. Nori Riis regarding Pt's 9/10 Pain. Per Nori Riis Page Resident. There is no contact information for Resident. RN to page MD again

## 2018-12-05 NOTE — Discharge Summary (Signed)
[facility] Discharge Summary  Patient name: [name] Medical record number: [id number] Date of birth: 05-26-1976 Age: 42 y.o. Gender: female Date of Admission: 12/04/2018  Date of Discharge: 12/06/2018 Admitting Physician: Dickie La, MD  Primary Care Provider: [name], MD Consultants: Vascular surgery ([facility])  Indication for Hospitalization: right foot cellulitis  Discharge Diagnoses/Problem List:  Cellulitis of right foot s/p amputation of great toe Insulin-dependent diabetes mellitus Hypertension Hyperlipidemia GERD OHS/OSA Asthma Depression Restless leg syndrome  Disposition: Transfer to [facility] vascular surgery service  Discharge Condition: stable  Discharge Exam:  General: obese, NAD with non-toxic appearance HEENT: normocephalic, atraumatic, moist mucous membranes Cardiovascular: regular rate and rhythm without murmurs, rubs, or gallops Lungs: clear to auscultation bilaterally with normal work of breathing Abdomen: soft, non-tender, non-distended, normoactive bowel sounds Skin: warm, dry, no rashes or lesions, cap refill < 2 seconds Extremities: warm and well perfused, normal tone, 2 cm laceration at head of first metatarsal amputation without bony prominence or overlying erythema, tracking, or induration, absent bleeding or purulence, absent calf tenderness to lower extremities bilaterally  Brief Hospital Course:  [name] is a 42 y.o. female presenting with 5 day h/o R foot pain s/p R great toe amputation 11/01/2018 at [facility]. Foot XR in ED showed no signs of osteomyelitis. Vitals stable on admission except for mild tachycardia. Admission labs notable for hyperglycemia to 128, neutrophilic leukocytosis without lactic acidosis.  Unable to probe wound during admission due to significant patient discomfort.  Wound appears sloughing with erythema and bluish discoloration surrounding, no streaks noted. Patient was started on IV  vancomycin and Zosyn. Ed provider spoke with Vascular Surgery at [facility] who agreed for transfer when bed available. Patient admitted for IV antibiotics and MRI of foot until transfer.  Patient continued to have leukocytosis while on broad-spectrum antibiotics.  It appears that she had Candida glabrata of along with several other organisms including GNR on her prior culture during her previous hospitalization.  She was continued on IV antibiotics and blood cultures were obtained prior to her transfer on 12/06/2018.  Issues for Follow Up:  1. Blood cultures have been obtained due to persistent leukocytosis while on broad-spectrum antibiotics including vancomycin and Zosyn.  There is consideration of possible fungal involvement due to prior growth on culture following amputation at [facility]. 2. Patient having persistent pain despite morphine and OxyIR.  Given additional dose of OxyContin 10 mg prior to transfer.  Significant Procedures: none  Significant Labs and Imaging:  Recent Labs  Lab 12/05/18 0149 12/06/18 0304  WBC 15.0* 15.1*  HGB 10.3* 9.0*  HCT 32.3* 28.3*  PLT 365 389   Recent Labs  Lab 12/05/18 0149 12/06/18 0304  NA 133* 133*  K 3.9 3.7  CL 97* 97*  CO2 25 27  GLUCOSE 355* 249*  BUN 10 9  CREATININE 0.95 1.04*  CALCIUM 8.4* 8.2*   Stat beta-hCG: Negative I-STAT lactic acid: 1.83 HIV antibody: Nonreactive Hemoglobin A1c: 10.1  Imaging/Diagnostic Tests: MRI OF THE RIGHT FOREFOOT WITHOUT AND WITH CONTRAST (12/05/2018) IMPRESSION: Status post amputation at the level of the mid to distal first metatarsal. Negative for osteomyelitis or abscess. Mild marrow edema in the proximal second metatarsal has an appearance most suggestive of stress change rather than osteomyelitis. Subcutaneous edema over the dorsum of the foot is compatible with dependent change and/or cellulitis.  RIGHT FOOT COMPLETE - 3+ VIEW (12/05/2018) IMPRESSION: Status post amputation at the distal first  metatarsal, unchanged in appearance on radiograph. No  definite osseous erosions seen, though evaluation for osteomyelitis is limited on radiograph.  Results/Tests Pending at Time of Discharge: Blood cultures x2  Discharge Medications:  Allergies as of 12/06/2018      Reactions   Kiwi Extract Shortness Of Breath, Swelling   Trental [pentoxifylline] Nausea And Vomiting   Nubain [nalbuphine Hcl] Other (See Comments)   "FEELS LIKE SOMETHING CRAWLING ON ME"      Medication List    STOP taking these medications   lisinopril 2.5 MG tablet Commonly known as:  PRINIVIL,ZESTRIL   oxyCODONE 5 MG immediate release tablet Commonly known as:  ROXICODONE     TAKE these medications   ACCU-CHEK NANO SMARTVIEW w/Device Kit Use to check blood sugar 3 times daily   ACCU-CHEK SOFTCLIX LANCETS lancets Use as instructed to check blood sugar 3 times per day   albuterol 108 (90 Base) MCG/ACT inhaler Commonly known as:  PROVENTIL HFA;VENTOLIN HFA Inhale 1-2 puffs into the lungs every 6 (six) hours as needed for wheezing or shortness of breath.   atorvastatin 20 MG tablet Commonly known as:  LIPITOR Take 1 tablet (20 mg total) by mouth daily.   budesonide-formoterol 160-4.5 MCG/ACT inhaler Commonly known as:  SYMBICORT inhale 2 PUFFS into THE lungs 2 TIMES DAILY What changed:    how much to take  how to take this  when to take this  additional instructions   cetirizine 10 MG tablet Commonly known as:  ZYRTEC Take 1 tablet (10 mg total) by mouth daily. What changed:    when to take this  reasons to take this   Cholecalciferol 25 MCG (1000 UT) tablet Take 1 tablet (1,000 Units total) by mouth daily.   gabapentin 600 MG tablet Commonly known as:  NEURONTIN TAKE 2 TABLETS BY MOUTH 3 TIMES DAILY What changed:  See the new instructions.   gentamicin cream 0.1 % Commonly known as:  GARAMYCIN Apply 1 application topically 3 (three) times daily.   glucose blood test strip Commonly  known as:  ACCU-CHEK GUIDE USE T0 CHECK BLOOD SUGAR 3 TIMES DAILY   HUMULIN 70/30 KWIKPEN (70-30) 100 UNIT/ML PEN Generic drug:  Insulin Isophane & Regular Human Inject 40-60 Units into the skin See admin instructions. Use 60 units every morning and use 40 units every evening   insulin aspart 100 UNIT/ML FlexPen Commonly known as:  NOVOLOG Inject 6 Units into the skin 3 (three) times daily with meals.   lidocaine 5 % Commonly known as:  LIDODERM Place 1 patch onto the skin daily for 7 days. Remove & Discard patch within 12 hours or as directed by MD   metFORMIN 500 MG 24 hr tablet Commonly known as:  GLUCOPHAGE-XR Take 500 mg by mouth daily with breakfast.   nystatin cream Commonly known as:  MYCOSTATIN Apply 1 application topically 2 (two) times daily.   nystatin powder Commonly known as:  MYCOSTATIN/NYSTOP Apply topically 4 (four) times daily.   pantoprazole 40 MG tablet Commonly known as:  PROTONIX Take 1 tablet (40 mg total) by mouth daily.       Discharge Instructions: Please refer to Patient Instructions section of EMR for full details.  Patient was counseled important signs and symptoms that should prompt return to medical care, changes in medications, dietary instructions, activity restrictions, and follow up appointments.      Ackerman Bing, DO 12/06/2018, 12:44 PM PGY-3, [place]

## 2018-12-06 DIAGNOSIS — E1140 Type 2 diabetes mellitus with diabetic neuropathy, unspecified: Secondary | ICD-10-CM | POA: Diagnosis not present

## 2018-12-06 DIAGNOSIS — L089 Local infection of the skin and subcutaneous tissue, unspecified: Secondary | ICD-10-CM

## 2018-12-06 LAB — BASIC METABOLIC PANEL
Anion gap: 9 (ref 5–15)
BUN: 9 mg/dL (ref 6–20)
CO2: 27 mmol/L (ref 22–32)
CREATININE: 1.04 mg/dL — AB (ref 0.44–1.00)
Calcium: 8.2 mg/dL — ABNORMAL LOW (ref 8.9–10.3)
Chloride: 97 mmol/L — ABNORMAL LOW (ref 98–111)
GFR calc Af Amer: >60 mL/min (ref >60)
GFR calc non Af Amer: >60 mL/min (ref >60)
Glucose, Bld: 249 mg/dL — ABNORMAL HIGH (ref 70–99)
Potassium: 3.7 mmol/L (ref 3.5–5.1)
SODIUM: 133 mmol/L — AB (ref 135–145)

## 2018-12-06 LAB — CBC WITH DIFFERENTIAL/PLATELET
Abs Immature Granulocytes: 0.11 10*3/uL — ABNORMAL HIGH (ref 0.00–0.07)
Basophils Absolute: 0 10*3/uL (ref 0.0–0.1)
Basophils Relative: 0 %
Eosinophils Absolute: 0.2 10*3/uL (ref 0.0–0.5)
Eosinophils Relative: 1 %
HCT: 28.3 % — ABNORMAL LOW (ref 36.0–46.0)
Hemoglobin: 9 g/dL — ABNORMAL LOW (ref 12.0–15.0)
IMMATURE GRANULOCYTES: 1 %
Lymphocytes Relative: 19 %
Lymphs Abs: 2.9 10*3/uL (ref 0.7–4.0)
MCH: 28 pg (ref 26.0–34.0)
MCHC: 31.8 g/dL (ref 30.0–36.0)
MCV: 87.9 fL (ref 80.0–100.0)
Monocytes Absolute: 1.3 10*3/uL — ABNORMAL HIGH (ref 0.1–1.0)
Monocytes Relative: 9 %
Neutro Abs: 10.6 10*3/uL — ABNORMAL HIGH (ref 1.7–7.7)
Neutrophils Relative %: 70 %
Platelets: 389 10*3/uL (ref 150–400)
RBC: 3.22 MIL/uL — ABNORMAL LOW (ref 3.87–5.11)
RDW: 12.1 % (ref 11.5–15.5)
WBC: 15.1 10*3/uL — AB (ref 4.0–10.5)
nRBC: 0 % (ref 0.0–0.2)

## 2018-12-06 LAB — HEMOGLOBIN A1C
Hgb A1c MFr Bld: 10.1 % — ABNORMAL HIGH (ref 4.8–5.6)
Mean Plasma Glucose: 243.17 mg/dL

## 2018-12-06 LAB — HIV ANTIBODY (ROUTINE TESTING W REFLEX): HIV Screen 4th Generation wRfx: NONREACTIVE

## 2018-12-06 LAB — GLUCOSE, CAPILLARY
Glucose-Capillary: 225 mg/dL — ABNORMAL HIGH (ref 70–99)
Glucose-Capillary: 246 mg/dL — ABNORMAL HIGH (ref 70–99)

## 2018-12-06 MED ORDER — OXYCODONE HCL ER 10 MG PO T12A
10.0000 mg | EXTENDED_RELEASE_TABLET | Freq: Two times a day (BID) | ORAL | Status: DC
  Administered 2018-12-06: 10 mg via ORAL
  Filled 2018-12-06: qty 1

## 2018-12-06 MED ORDER — HEPARIN SODIUM (PORCINE) 5000 UNIT/ML IJ SOLN
5000.0000 [IU] | Freq: Three times a day (TID) | INTRAMUSCULAR | Status: DC
  Administered 2018-12-06: 5000 [IU] via SUBCUTANEOUS
  Filled 2018-12-06: qty 1

## 2018-12-06 MED ORDER — INSULIN ASPART 100 UNIT/ML ~~LOC~~ SOLN
0.0000 [IU] | Freq: Three times a day (TID) | SUBCUTANEOUS | Status: DC
  Administered 2018-12-06 (×2): 5 [IU] via SUBCUTANEOUS

## 2018-12-06 MED ORDER — INSULIN ASPART 100 UNIT/ML ~~LOC~~ SOLN
7.0000 [IU] | Freq: Three times a day (TID) | SUBCUTANEOUS | Status: DC
  Administered 2018-12-06 (×2): 7 [IU] via SUBCUTANEOUS

## 2018-12-06 NOTE — Progress Notes (Signed)
CSW acknowledging consult for "going from here to [facility]". Inappropriate consult; CSW does not assist with hospital to hospital transfers. Please consult CSW if appropriate CSW need arises. For now, CSW signing off.  Laveda Abbe, [place] Clinical Social Worker [phone]

## 2018-12-06 NOTE — Progress Notes (Signed)
Report called to receiving RN [phone] at [facility]. Patient with no complaints other than pain in right foot. Will continue to monitor at this time.

## 2018-12-07 DIAGNOSIS — T8743 Infection of amputation stump, right lower extremity: Secondary | ICD-10-CM | POA: Diagnosis not present

## 2018-12-07 DIAGNOSIS — E1140 Type 2 diabetes mellitus with diabetic neuropathy, unspecified: Secondary | ICD-10-CM | POA: Diagnosis not present

## 2018-12-07 DIAGNOSIS — B957 Other staphylococcus as the cause of diseases classified elsewhere: Secondary | ICD-10-CM | POA: Diagnosis not present

## 2018-12-07 DIAGNOSIS — I96 Gangrene, not elsewhere classified: Secondary | ICD-10-CM | POA: Diagnosis not present

## 2018-12-07 DIAGNOSIS — G8918 Other acute postprocedural pain: Secondary | ICD-10-CM | POA: Diagnosis not present

## 2018-12-07 DIAGNOSIS — T8753 Necrosis of amputation stump, right lower extremity: Secondary | ICD-10-CM | POA: Diagnosis not present

## 2018-12-08 DIAGNOSIS — T8743 Infection of amputation stump, right lower extremity: Secondary | ICD-10-CM | POA: Diagnosis not present

## 2018-12-08 DIAGNOSIS — E1140 Type 2 diabetes mellitus with diabetic neuropathy, unspecified: Secondary | ICD-10-CM | POA: Diagnosis not present

## 2018-12-08 DIAGNOSIS — N179 Acute kidney failure, unspecified: Secondary | ICD-10-CM | POA: Diagnosis not present

## 2018-12-08 DIAGNOSIS — E119 Type 2 diabetes mellitus without complications: Secondary | ICD-10-CM | POA: Diagnosis not present

## 2018-12-08 DIAGNOSIS — Z794 Long term (current) use of insulin: Secondary | ICD-10-CM | POA: Diagnosis not present

## 2018-12-08 DIAGNOSIS — E1169 Type 2 diabetes mellitus with other specified complication: Secondary | ICD-10-CM | POA: Diagnosis not present

## 2018-12-08 DIAGNOSIS — G4733 Obstructive sleep apnea (adult) (pediatric): Secondary | ICD-10-CM | POA: Diagnosis not present

## 2018-12-08 DIAGNOSIS — M86171 Other acute osteomyelitis, right ankle and foot: Secondary | ICD-10-CM | POA: Diagnosis not present

## 2018-12-08 DIAGNOSIS — R000 Tachycardia, unspecified: Secondary | ICD-10-CM | POA: Diagnosis not present

## 2018-12-08 IMAGING — MR MR ANKLE*L* WO/W CM
4 of 9 series · 19 of 40 positions shown · IV contrast (20 MH)
Comparison: Plain films the left foot 10/29/2017.

CLINICAL DATA: Diabetic patient the developed a blister on her left
heel in May 2017 which persisted and became sore with
foul-smelling drainage.

EXAM:
MRI OF THE LEFT ANKLE WITHOUT AND WITH CONTRAST
TECHNIQUE: Multiplanar, multisequence MR imaging of the ankle was performed
before and after the administration of intravenous contrast.
CONTRAST:  20 ml MULTIHANCE GADOBENATE DIMEGLUMINE 529 MG/ML IV SOLN

[Series 4: T1 · axial · 3.0mm · 0.27mm/px · z∈[-32,+105]mm · 6 of 40 slices shown (1 of 2)]
[im 1/40]
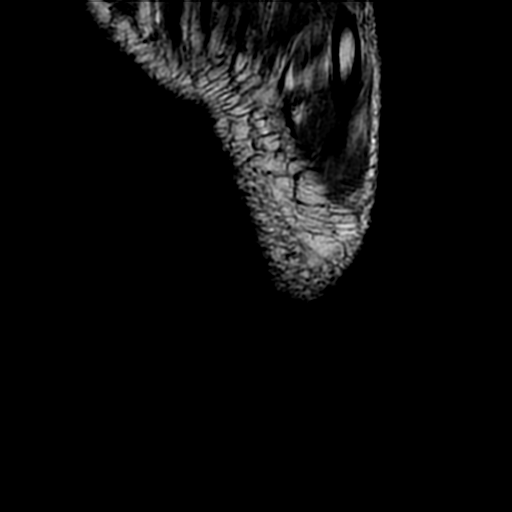
[im 8/40]
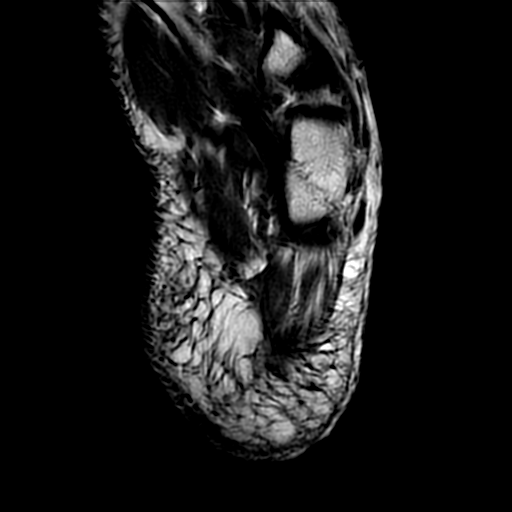
[im 16/40]
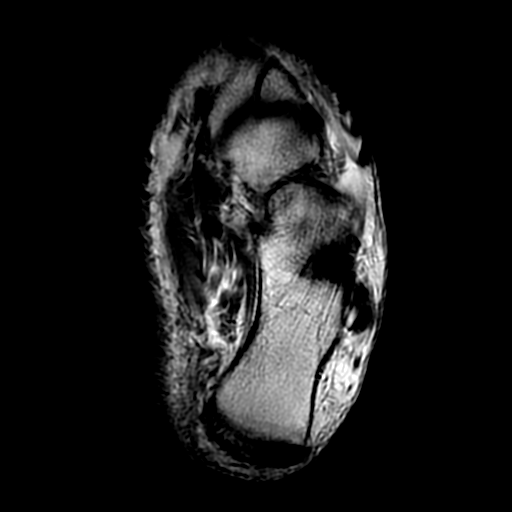
[im 24/40]
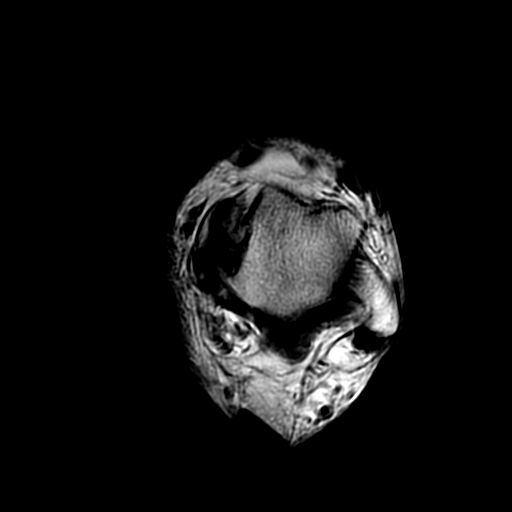
[im 32/40]
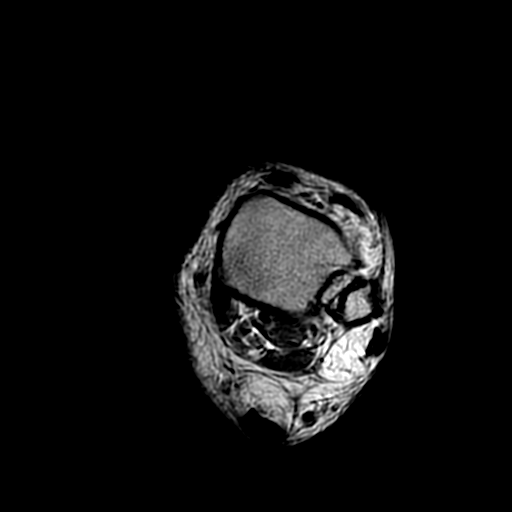
[im 40/40]
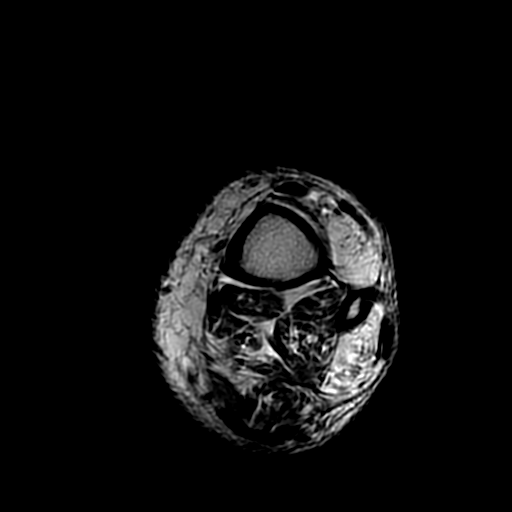

[Series 5: T1 fat-sat · axial · non-contrast · 3.0mm · 0.27mm/px · z∈[-32,+105]mm · 5 of 40 slices shown]
[im 1/40]
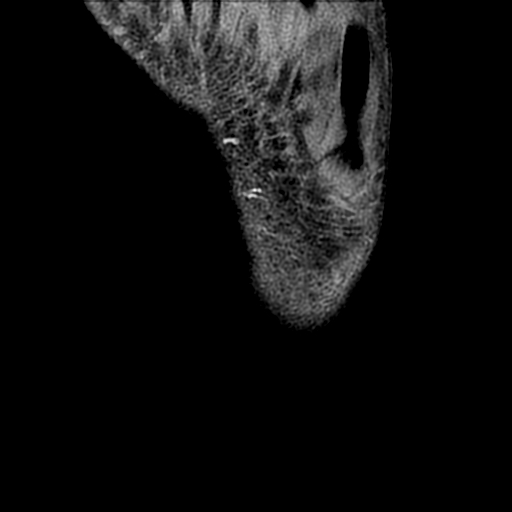
[im 8/40]
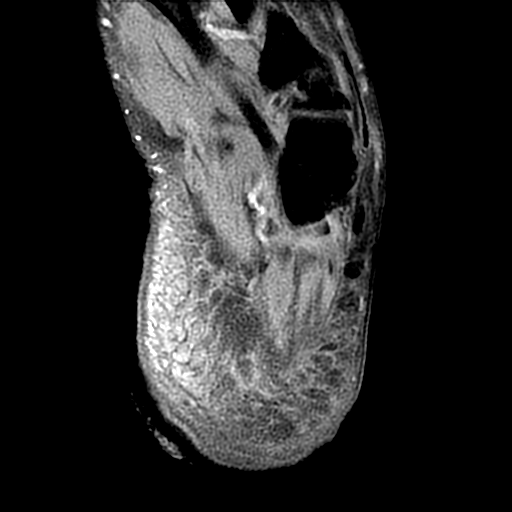
[im 16/40]
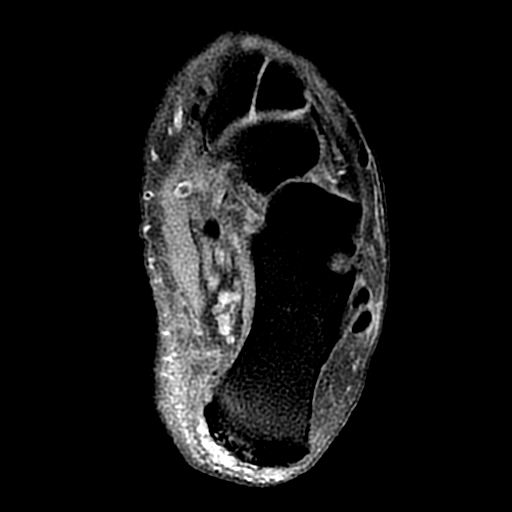
[im 24/40]
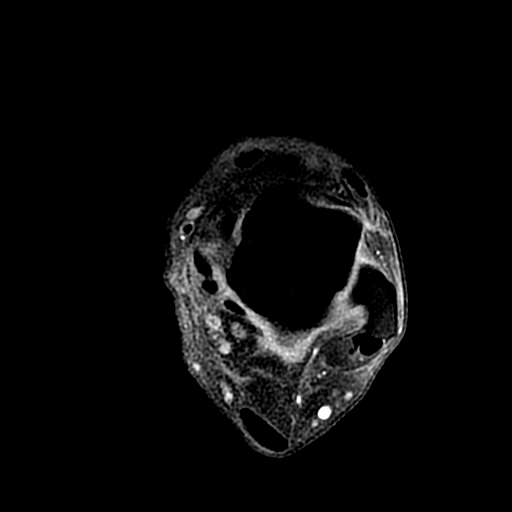
[im 40/40]
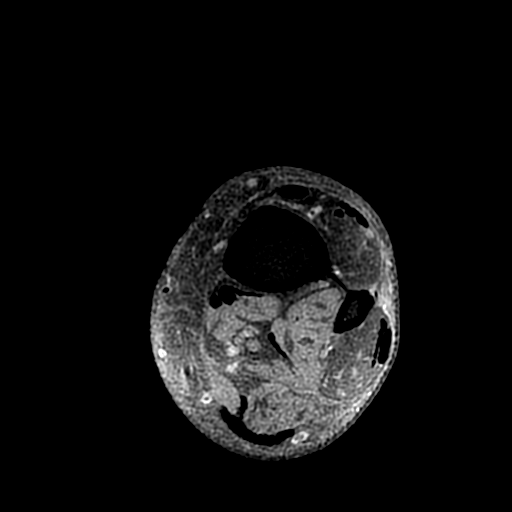

[Series 6: T2 fat-sat · axial · 3.0mm · 0.27mm/px · z∈[-32,+105]mm · 5 of 40 slices shown]
[im 1/40]
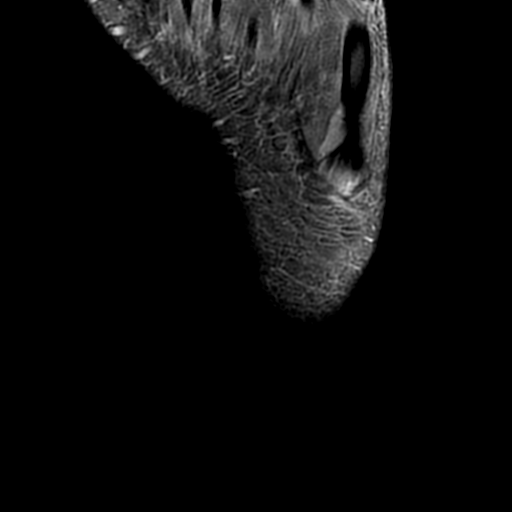
[im 10/40]
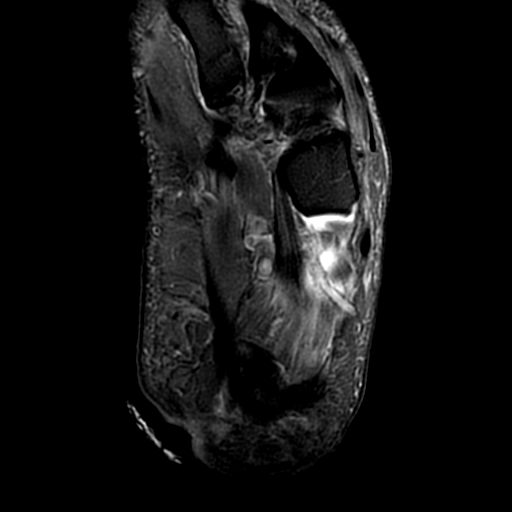
[im 20/40]
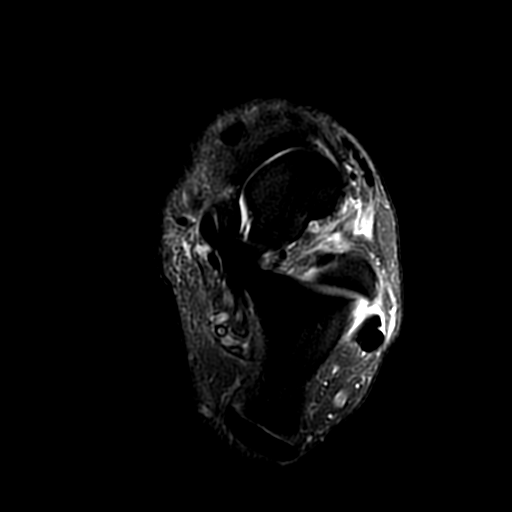
[im 30/40]
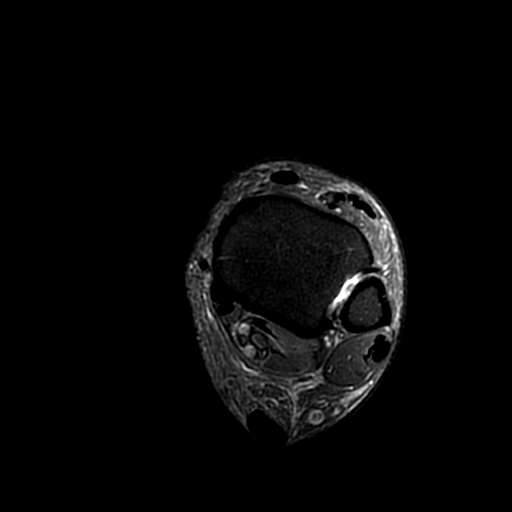
[im 40/40]
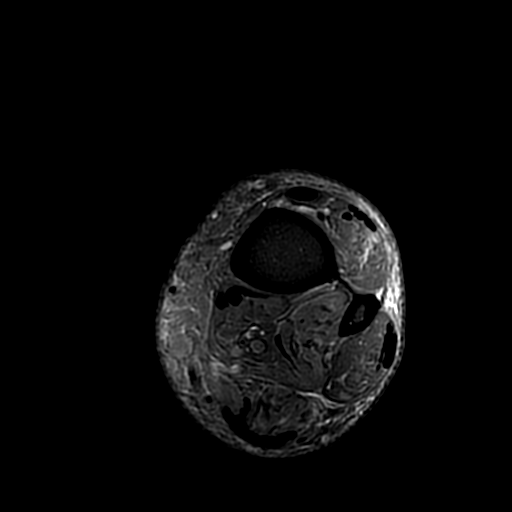

[Series 10: T1 · coronal · 3.0mm · 0.27mm/px · 3 of 32 slices shown (2 of 2)]
[im 1/32]
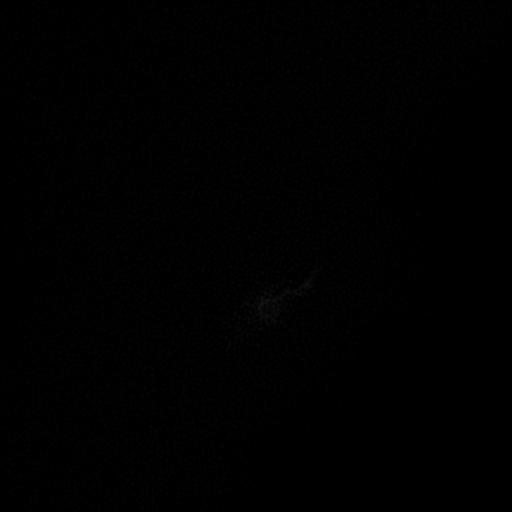
[im 21/32]
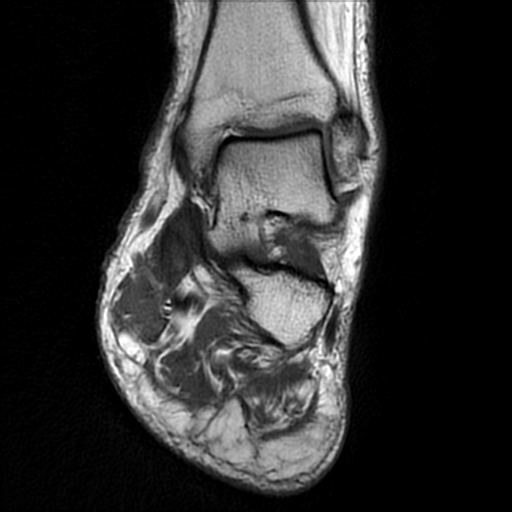
[im 32/32]
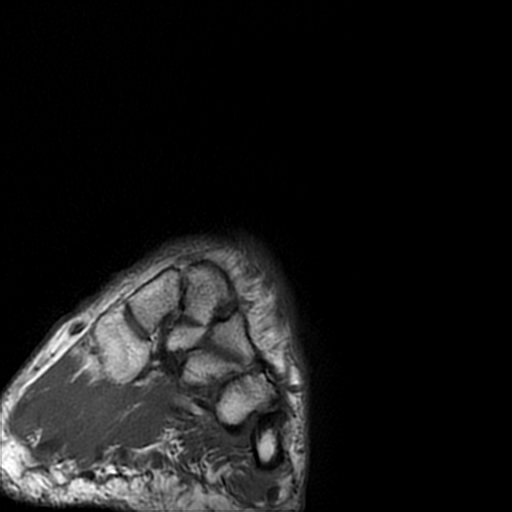

[19 of 40 positions shown; findings below may reference images not displayed]

FINDINGS: TENDONS

Peroneal: Intact.

Posteromedial: Intact.

Anterior: Intact.

Achilles: Intact.

Plantar Fascia: Mildly increased T2 signal is seen in the medial
cord. Plantar calcaneal spur noted.

LIGAMENTS

Lateral: Intact.

Medial: Intact.

CARTILAGE

Ankle Joint: No effusion.

Subtalar Joints/Sinus Tarsi: Unremarkable.

Bones: Normal marrow signal throughout. No evidence of
osteomyelitis.

Other: Skin ulceration on the fat pad of the heel is identified.
There is some underlying soft tissue enhancement. No abscess.
IMPRESSION: IMPRESSION
Skin ulceration on the heel with mild appearing underlying
cellulitis. Negative for abscess, osteomyelitis or septic joint.

Mild appearing plantar fasciitis.

## 2018-12-09 DIAGNOSIS — G4733 Obstructive sleep apnea (adult) (pediatric): Secondary | ICD-10-CM | POA: Diagnosis not present

## 2018-12-09 DIAGNOSIS — T8743 Infection of amputation stump, right lower extremity: Secondary | ICD-10-CM | POA: Diagnosis not present

## 2018-12-09 DIAGNOSIS — N179 Acute kidney failure, unspecified: Secondary | ICD-10-CM | POA: Diagnosis not present

## 2018-12-09 DIAGNOSIS — E119 Type 2 diabetes mellitus without complications: Secondary | ICD-10-CM | POA: Diagnosis not present

## 2018-12-09 DIAGNOSIS — E1140 Type 2 diabetes mellitus with diabetic neuropathy, unspecified: Secondary | ICD-10-CM | POA: Diagnosis not present

## 2018-12-09 DIAGNOSIS — Z794 Long term (current) use of insulin: Secondary | ICD-10-CM | POA: Diagnosis not present

## 2018-12-10 DIAGNOSIS — N179 Acute kidney failure, unspecified: Secondary | ICD-10-CM | POA: Diagnosis not present

## 2018-12-10 DIAGNOSIS — G4733 Obstructive sleep apnea (adult) (pediatric): Secondary | ICD-10-CM | POA: Diagnosis not present

## 2018-12-10 DIAGNOSIS — E119 Type 2 diabetes mellitus without complications: Secondary | ICD-10-CM | POA: Diagnosis not present

## 2018-12-10 DIAGNOSIS — E1140 Type 2 diabetes mellitus with diabetic neuropathy, unspecified: Secondary | ICD-10-CM | POA: Diagnosis not present

## 2018-12-10 DIAGNOSIS — T8743 Infection of amputation stump, right lower extremity: Secondary | ICD-10-CM | POA: Diagnosis not present

## 2018-12-11 DIAGNOSIS — Z794 Long term (current) use of insulin: Secondary | ICD-10-CM | POA: Diagnosis not present

## 2018-12-11 DIAGNOSIS — L97514 Non-pressure chronic ulcer of other part of right foot with necrosis of bone: Secondary | ICD-10-CM | POA: Diagnosis not present

## 2018-12-11 DIAGNOSIS — R400 Somnolence: Secondary | ICD-10-CM | POA: Diagnosis not present

## 2018-12-11 DIAGNOSIS — N179 Acute kidney failure, unspecified: Secondary | ICD-10-CM | POA: Diagnosis not present

## 2018-12-11 DIAGNOSIS — F05 Delirium due to known physiological condition: Secondary | ICD-10-CM | POA: Diagnosis not present

## 2018-12-11 DIAGNOSIS — E10621 Type 1 diabetes mellitus with foot ulcer: Secondary | ICD-10-CM | POA: Diagnosis not present

## 2018-12-11 DIAGNOSIS — G4733 Obstructive sleep apnea (adult) (pediatric): Secondary | ICD-10-CM | POA: Diagnosis not present

## 2018-12-11 DIAGNOSIS — E1165 Type 2 diabetes mellitus with hyperglycemia: Secondary | ICD-10-CM | POA: Diagnosis not present

## 2018-12-11 DIAGNOSIS — S91301S Unspecified open wound, right foot, sequela: Secondary | ICD-10-CM | POA: Diagnosis not present

## 2018-12-11 LAB — CULTURE, BLOOD (ROUTINE X 2)
Culture: NO GROWTH
Culture: NO GROWTH
Special Requests: ADEQUATE

## 2018-12-12 DIAGNOSIS — G9340 Encephalopathy, unspecified: Secondary | ICD-10-CM | POA: Diagnosis not present

## 2018-12-12 DIAGNOSIS — M6520 Calcific tendinitis, unspecified site: Secondary | ICD-10-CM | POA: Diagnosis not present

## 2018-12-12 DIAGNOSIS — M7591 Shoulder lesion, unspecified, right shoulder: Secondary | ICD-10-CM | POA: Diagnosis not present

## 2018-12-12 DIAGNOSIS — N179 Acute kidney failure, unspecified: Secondary | ICD-10-CM | POA: Diagnosis not present

## 2018-12-12 DIAGNOSIS — S91301S Unspecified open wound, right foot, sequela: Secondary | ICD-10-CM | POA: Diagnosis not present

## 2018-12-13 DIAGNOSIS — E10621 Type 1 diabetes mellitus with foot ulcer: Secondary | ICD-10-CM | POA: Diagnosis not present

## 2018-12-13 DIAGNOSIS — R400 Somnolence: Secondary | ICD-10-CM | POA: Diagnosis not present

## 2018-12-13 DIAGNOSIS — L97514 Non-pressure chronic ulcer of other part of right foot with necrosis of bone: Secondary | ICD-10-CM | POA: Diagnosis not present

## 2018-12-13 DIAGNOSIS — F05 Delirium due to known physiological condition: Secondary | ICD-10-CM | POA: Diagnosis not present

## 2018-12-13 DIAGNOSIS — N179 Acute kidney failure, unspecified: Secondary | ICD-10-CM | POA: Diagnosis not present

## 2018-12-13 DIAGNOSIS — G9340 Encephalopathy, unspecified: Secondary | ICD-10-CM | POA: Diagnosis not present

## 2018-12-13 DIAGNOSIS — S91301S Unspecified open wound, right foot, sequela: Secondary | ICD-10-CM | POA: Diagnosis not present

## 2018-12-13 MED ORDER — HYDRALAZINE HCL 20 MG/ML IJ SOLN
10.00 | INTRAMUSCULAR | Status: DC
Start: ? — End: 2018-12-13

## 2018-12-13 MED ORDER — ATORVASTATIN CALCIUM 10 MG PO TABS
20.00 | ORAL_TABLET | ORAL | Status: DC
Start: 2018-12-16 — End: 2018-12-13

## 2018-12-13 MED ORDER — LIDOCAINE HCL 1 % IJ SOLN
.50 | INTRAMUSCULAR | Status: DC
Start: ? — End: 2018-12-13

## 2018-12-13 MED ORDER — POLYETHYLENE GLYCOL 3350 17 G PO PACK
17.00 | PACK | ORAL | Status: DC
Start: 2018-12-14 — End: 2018-12-13

## 2018-12-13 MED ORDER — ACETAMINOPHEN 325 MG PO TABS
650.00 | ORAL_TABLET | ORAL | Status: DC
Start: 2018-12-15 — End: 2018-12-13

## 2018-12-13 MED ORDER — OXYCODONE HCL 5 MG PO TABS
5.00 | ORAL_TABLET | ORAL | Status: DC
Start: ? — End: 2018-12-13

## 2018-12-13 MED ORDER — PANTOPRAZOLE SODIUM 40 MG PO TBEC
40.00 | DELAYED_RELEASE_TABLET | ORAL | Status: DC
Start: 2018-12-16 — End: 2018-12-13

## 2018-12-13 MED ORDER — METRONIDAZOLE IN NACL 5-0.79 MG/ML-% IV SOLN
500.00 | INTRAVENOUS | Status: DC
Start: 2018-12-14 — End: 2018-12-13

## 2018-12-13 MED ORDER — GENERIC EXTERNAL MEDICATION
12.50 | Status: DC
Start: ? — End: 2018-12-13

## 2018-12-13 MED ORDER — HEPARIN SODIUM (PORCINE) 5000 UNIT/ML IJ SOLN
5000.00 | INTRAMUSCULAR | Status: DC
Start: 2018-12-15 — End: 2018-12-13

## 2018-12-13 MED ORDER — ONDANSETRON HCL 4 MG/2ML IJ SOLN
4.00 | INTRAMUSCULAR | Status: DC
Start: ? — End: 2018-12-13

## 2018-12-13 MED ORDER — DEXTROSE 50 % IV SOLN
12.50 | INTRAVENOUS | Status: DC
Start: ? — End: 2018-12-13

## 2018-12-13 MED ORDER — INSULIN GLARGINE 100 UNIT/ML ~~LOC~~ SOLN
25.00 | SUBCUTANEOUS | Status: DC
Start: 2018-12-15 — End: 2018-12-13

## 2018-12-13 MED ORDER — INSULIN LISPRO 100 UNIT/ML ~~LOC~~ SOLN
0.00 | SUBCUTANEOUS | Status: DC
Start: 2018-12-15 — End: 2018-12-13

## 2018-12-13 MED ORDER — PHENYLEPHRINE-GUAIFENESIN 30-400 MG PO CP12
5.00 | ORAL_CAPSULE | ORAL | Status: DC
Start: 2018-12-16 — End: 2018-12-13

## 2018-12-13 MED ORDER — ONDANSETRON 4 MG PO TBDP
4.00 | ORAL_TABLET | ORAL | Status: DC
Start: ? — End: 2018-12-13

## 2018-12-13 MED ORDER — GLUCAGON HCL RDNA (DIAGNOSTIC) 1 MG IJ SOLR
1.00 | INTRAMUSCULAR | Status: DC
Start: ? — End: 2018-12-13

## 2018-12-13 MED ORDER — GENERIC EXTERNAL MEDICATION
1.00 | Status: DC
Start: 2018-12-16 — End: 2018-12-13

## 2018-12-13 MED ORDER — ALBUTEROL SULFATE HFA 108 (90 BASE) MCG/ACT IN AERS
2.00 | INHALATION_SPRAY | RESPIRATORY_TRACT | Status: DC
Start: ? — End: 2018-12-13

## 2018-12-13 MED ORDER — LACTATED RINGERS IV SOLN
INTRAVENOUS | Status: DC
Start: ? — End: 2018-12-13

## 2018-12-13 MED ORDER — LIDOCAINE HCL 1 % IJ SOLN
3.00 | INTRAMUSCULAR | Status: DC
Start: ? — End: 2018-12-13

## 2018-12-13 MED ORDER — SENNOSIDES-DOCUSATE SODIUM 8.6-50 MG PO TABS
2.00 | ORAL_TABLET | ORAL | Status: DC
Start: 2018-12-14 — End: 2018-12-13

## 2018-12-13 MED ORDER — CLONIDINE 0.1 MG/24HR TD PTWK
1.00 | MEDICATED_PATCH | TRANSDERMAL | Status: DC
Start: 2018-12-19 — End: 2018-12-13

## 2018-12-13 MED ORDER — INSULIN LISPRO 100 UNIT/ML ~~LOC~~ SOLN
5.00 | SUBCUTANEOUS | Status: DC
Start: 2018-12-13 — End: 2018-12-13

## 2018-12-14 ENCOUNTER — Ambulatory Visit: Payer: Medicaid Other | Admitting: Family Medicine

## 2018-12-14 DIAGNOSIS — E119 Type 2 diabetes mellitus without complications: Secondary | ICD-10-CM | POA: Diagnosis not present

## 2018-12-14 DIAGNOSIS — R42 Dizziness and giddiness: Secondary | ICD-10-CM | POA: Diagnosis not present

## 2018-12-14 DIAGNOSIS — Z794 Long term (current) use of insulin: Secondary | ICD-10-CM | POA: Diagnosis not present

## 2018-12-14 DIAGNOSIS — H538 Other visual disturbances: Secondary | ICD-10-CM | POA: Diagnosis not present

## 2018-12-14 DIAGNOSIS — E1165 Type 2 diabetes mellitus with hyperglycemia: Secondary | ICD-10-CM | POA: Diagnosis not present

## 2018-12-14 DIAGNOSIS — N179 Acute kidney failure, unspecified: Secondary | ICD-10-CM | POA: Diagnosis not present

## 2018-12-14 DIAGNOSIS — S91301S Unspecified open wound, right foot, sequela: Secondary | ICD-10-CM | POA: Diagnosis not present

## 2018-12-14 DIAGNOSIS — G9340 Encephalopathy, unspecified: Secondary | ICD-10-CM | POA: Diagnosis not present

## 2018-12-15 ENCOUNTER — Encounter: Payer: Self-pay | Admitting: Family Medicine

## 2018-12-15 ENCOUNTER — Telehealth: Payer: Self-pay | Admitting: Family Medicine

## 2018-12-15 DIAGNOSIS — S91301S Unspecified open wound, right foot, sequela: Secondary | ICD-10-CM | POA: Diagnosis not present

## 2018-12-15 DIAGNOSIS — G9340 Encephalopathy, unspecified: Secondary | ICD-10-CM | POA: Diagnosis not present

## 2018-12-15 DIAGNOSIS — F05 Delirium due to known physiological condition: Secondary | ICD-10-CM | POA: Diagnosis not present

## 2018-12-15 DIAGNOSIS — R400 Somnolence: Secondary | ICD-10-CM | POA: Diagnosis not present

## 2018-12-15 DIAGNOSIS — L97514 Non-pressure chronic ulcer of other part of right foot with necrosis of bone: Secondary | ICD-10-CM | POA: Diagnosis not present

## 2018-12-15 DIAGNOSIS — N179 Acute kidney failure, unspecified: Secondary | ICD-10-CM | POA: Diagnosis not present

## 2018-12-15 DIAGNOSIS — Z794 Long term (current) use of insulin: Secondary | ICD-10-CM | POA: Diagnosis not present

## 2018-12-15 DIAGNOSIS — E10621 Type 1 diabetes mellitus with foot ulcer: Secondary | ICD-10-CM | POA: Diagnosis not present

## 2018-12-15 DIAGNOSIS — E1165 Type 2 diabetes mellitus with hyperglycemia: Secondary | ICD-10-CM | POA: Diagnosis not present

## 2018-12-15 MED ORDER — METRONIDAZOLE 250 MG PO TABS
500.00 | ORAL_TABLET | ORAL | Status: DC
Start: 2018-12-15 — End: 2018-12-15

## 2018-12-15 MED ORDER — HYDROMORPHONE HCL 1 MG/ML IJ SOLN
.75 | INTRAMUSCULAR | Status: DC
Start: ? — End: 2018-12-15

## 2018-12-15 MED ORDER — OXYCODONE HCL 5 MG PO TABS
5.00 | ORAL_TABLET | ORAL | Status: DC
Start: ? — End: 2018-12-15

## 2018-12-15 MED ORDER — GABAPENTIN 300 MG PO CAPS
300.00 | ORAL_CAPSULE | ORAL | Status: DC
Start: 2018-12-15 — End: 2018-12-15

## 2018-12-15 MED ORDER — POLYETHYLENE GLYCOL 3350 17 G PO PACK
17.00 | PACK | ORAL | Status: DC
Start: ? — End: 2018-12-15

## 2018-12-15 MED ORDER — SENNOSIDES-DOCUSATE SODIUM 8.6-50 MG PO TABS
2.00 | ORAL_TABLET | ORAL | Status: DC
Start: ? — End: 2018-12-15

## 2018-12-15 MED ORDER — INSULIN LISPRO 100 UNIT/ML ~~LOC~~ SOLN
6.00 | SUBCUTANEOUS | Status: DC
Start: 2018-12-15 — End: 2018-12-15

## 2018-12-15 NOTE — Telephone Encounter (Signed)
Pt called and she wants Dr. [name] to call her back at  [phone]. ad

## 2018-12-15 NOTE — Telephone Encounter (Signed)
Late entry - called patient back this AM & spoke to her. She updated me on her hospital course at [facility]. Notes visible in Care Everywhere. Patient appreciative of the call.  [name], MD

## 2018-12-16 DIAGNOSIS — S91301D Unspecified open wound, right foot, subsequent encounter: Secondary | ICD-10-CM | POA: Diagnosis not present

## 2018-12-16 DIAGNOSIS — N179 Acute kidney failure, unspecified: Secondary | ICD-10-CM | POA: Diagnosis not present

## 2018-12-16 DIAGNOSIS — G9340 Encephalopathy, unspecified: Secondary | ICD-10-CM | POA: Diagnosis not present

## 2018-12-16 DIAGNOSIS — I10 Essential (primary) hypertension: Secondary | ICD-10-CM | POA: Diagnosis not present

## 2018-12-17 DIAGNOSIS — I10 Essential (primary) hypertension: Secondary | ICD-10-CM | POA: Diagnosis not present

## 2018-12-17 DIAGNOSIS — Z794 Long term (current) use of insulin: Secondary | ICD-10-CM | POA: Diagnosis not present

## 2018-12-17 DIAGNOSIS — S91301D Unspecified open wound, right foot, subsequent encounter: Secondary | ICD-10-CM | POA: Diagnosis not present

## 2018-12-17 DIAGNOSIS — E1165 Type 2 diabetes mellitus with hyperglycemia: Secondary | ICD-10-CM | POA: Diagnosis not present

## 2018-12-17 DIAGNOSIS — G9340 Encephalopathy, unspecified: Secondary | ICD-10-CM | POA: Diagnosis not present

## 2018-12-17 DIAGNOSIS — N179 Acute kidney failure, unspecified: Secondary | ICD-10-CM | POA: Diagnosis not present

## 2018-12-18 DIAGNOSIS — G9340 Encephalopathy, unspecified: Secondary | ICD-10-CM | POA: Diagnosis not present

## 2018-12-18 DIAGNOSIS — I10 Essential (primary) hypertension: Secondary | ICD-10-CM | POA: Diagnosis not present

## 2018-12-18 DIAGNOSIS — N179 Acute kidney failure, unspecified: Secondary | ICD-10-CM | POA: Diagnosis not present

## 2018-12-18 DIAGNOSIS — E1165 Type 2 diabetes mellitus with hyperglycemia: Secondary | ICD-10-CM | POA: Diagnosis not present

## 2018-12-18 DIAGNOSIS — S91301D Unspecified open wound, right foot, subsequent encounter: Secondary | ICD-10-CM | POA: Diagnosis not present

## 2018-12-18 DIAGNOSIS — Z794 Long term (current) use of insulin: Secondary | ICD-10-CM | POA: Diagnosis not present

## 2018-12-19 DIAGNOSIS — N179 Acute kidney failure, unspecified: Secondary | ICD-10-CM | POA: Diagnosis not present

## 2018-12-19 DIAGNOSIS — S91301S Unspecified open wound, right foot, sequela: Secondary | ICD-10-CM | POA: Diagnosis not present

## 2018-12-19 DIAGNOSIS — S91301D Unspecified open wound, right foot, subsequent encounter: Secondary | ICD-10-CM | POA: Diagnosis not present

## 2018-12-19 DIAGNOSIS — F05 Delirium due to known physiological condition: Secondary | ICD-10-CM | POA: Diagnosis not present

## 2018-12-19 DIAGNOSIS — E1165 Type 2 diabetes mellitus with hyperglycemia: Secondary | ICD-10-CM | POA: Diagnosis not present

## 2018-12-19 DIAGNOSIS — E10621 Type 1 diabetes mellitus with foot ulcer: Secondary | ICD-10-CM | POA: Diagnosis not present

## 2018-12-19 DIAGNOSIS — I10 Essential (primary) hypertension: Secondary | ICD-10-CM | POA: Diagnosis not present

## 2018-12-19 DIAGNOSIS — G9340 Encephalopathy, unspecified: Secondary | ICD-10-CM | POA: Diagnosis not present

## 2018-12-19 DIAGNOSIS — L97514 Non-pressure chronic ulcer of other part of right foot with necrosis of bone: Secondary | ICD-10-CM | POA: Diagnosis not present

## 2018-12-19 DIAGNOSIS — R400 Somnolence: Secondary | ICD-10-CM | POA: Diagnosis not present

## 2018-12-20 DIAGNOSIS — S91301D Unspecified open wound, right foot, subsequent encounter: Secondary | ICD-10-CM | POA: Diagnosis not present

## 2018-12-20 DIAGNOSIS — I70292 Other atherosclerosis of native arteries of extremities, left leg: Secondary | ICD-10-CM | POA: Diagnosis not present

## 2018-12-20 DIAGNOSIS — I70201 Unspecified atherosclerosis of native arteries of extremities, right leg: Secondary | ICD-10-CM | POA: Diagnosis not present

## 2018-12-20 DIAGNOSIS — B958 Unspecified staphylococcus as the cause of diseases classified elsewhere: Secondary | ICD-10-CM | POA: Diagnosis not present

## 2018-12-20 DIAGNOSIS — N179 Acute kidney failure, unspecified: Secondary | ICD-10-CM | POA: Diagnosis not present

## 2018-12-20 DIAGNOSIS — G9340 Encephalopathy, unspecified: Secondary | ICD-10-CM | POA: Diagnosis not present

## 2018-12-20 DIAGNOSIS — I10 Essential (primary) hypertension: Secondary | ICD-10-CM | POA: Diagnosis not present

## 2018-12-20 DIAGNOSIS — E11628 Type 2 diabetes mellitus with other skin complications: Secondary | ICD-10-CM | POA: Diagnosis not present

## 2018-12-21 DIAGNOSIS — N179 Acute kidney failure, unspecified: Secondary | ICD-10-CM | POA: Diagnosis not present

## 2018-12-21 DIAGNOSIS — S91301D Unspecified open wound, right foot, subsequent encounter: Secondary | ICD-10-CM | POA: Diagnosis not present

## 2018-12-21 DIAGNOSIS — E1165 Type 2 diabetes mellitus with hyperglycemia: Secondary | ICD-10-CM | POA: Diagnosis not present

## 2018-12-21 DIAGNOSIS — Z794 Long term (current) use of insulin: Secondary | ICD-10-CM | POA: Diagnosis not present

## 2018-12-21 DIAGNOSIS — G9340 Encephalopathy, unspecified: Secondary | ICD-10-CM | POA: Diagnosis not present

## 2018-12-21 DIAGNOSIS — I10 Essential (primary) hypertension: Secondary | ICD-10-CM | POA: Diagnosis not present

## 2018-12-22 DIAGNOSIS — R400 Somnolence: Secondary | ICD-10-CM | POA: Diagnosis not present

## 2018-12-22 DIAGNOSIS — S91301D Unspecified open wound, right foot, subsequent encounter: Secondary | ICD-10-CM | POA: Diagnosis not present

## 2018-12-22 DIAGNOSIS — F05 Delirium due to known physiological condition: Secondary | ICD-10-CM | POA: Diagnosis not present

## 2018-12-22 DIAGNOSIS — I10 Essential (primary) hypertension: Secondary | ICD-10-CM | POA: Diagnosis not present

## 2018-12-22 DIAGNOSIS — G9340 Encephalopathy, unspecified: Secondary | ICD-10-CM | POA: Diagnosis not present

## 2018-12-22 DIAGNOSIS — E10621 Type 1 diabetes mellitus with foot ulcer: Secondary | ICD-10-CM | POA: Diagnosis not present

## 2018-12-22 DIAGNOSIS — N179 Acute kidney failure, unspecified: Secondary | ICD-10-CM | POA: Diagnosis not present

## 2018-12-22 DIAGNOSIS — L97514 Non-pressure chronic ulcer of other part of right foot with necrosis of bone: Secondary | ICD-10-CM | POA: Diagnosis not present

## 2018-12-23 DIAGNOSIS — E11628 Type 2 diabetes mellitus with other skin complications: Secondary | ICD-10-CM | POA: Diagnosis not present

## 2018-12-23 DIAGNOSIS — I10 Essential (primary) hypertension: Secondary | ICD-10-CM | POA: Diagnosis not present

## 2018-12-23 DIAGNOSIS — N179 Acute kidney failure, unspecified: Secondary | ICD-10-CM | POA: Diagnosis not present

## 2018-12-24 DIAGNOSIS — E11628 Type 2 diabetes mellitus with other skin complications: Secondary | ICD-10-CM | POA: Diagnosis not present

## 2018-12-24 DIAGNOSIS — R531 Weakness: Secondary | ICD-10-CM | POA: Diagnosis not present

## 2018-12-24 DIAGNOSIS — R29818 Other symptoms and signs involving the nervous system: Secondary | ICD-10-CM | POA: Diagnosis not present

## 2018-12-24 DIAGNOSIS — G9340 Encephalopathy, unspecified: Secondary | ICD-10-CM | POA: Diagnosis not present

## 2018-12-24 DIAGNOSIS — I10 Essential (primary) hypertension: Secondary | ICD-10-CM | POA: Diagnosis not present

## 2018-12-24 DIAGNOSIS — N179 Acute kidney failure, unspecified: Secondary | ICD-10-CM | POA: Diagnosis not present

## 2018-12-25 DIAGNOSIS — G9340 Encephalopathy, unspecified: Secondary | ICD-10-CM | POA: Diagnosis not present

## 2018-12-25 DIAGNOSIS — R531 Weakness: Secondary | ICD-10-CM | POA: Diagnosis not present

## 2018-12-25 DIAGNOSIS — Z89512 Acquired absence of left leg below knee: Secondary | ICD-10-CM | POA: Diagnosis not present

## 2018-12-25 DIAGNOSIS — N179 Acute kidney failure, unspecified: Secondary | ICD-10-CM | POA: Diagnosis not present

## 2018-12-25 DIAGNOSIS — M4802 Spinal stenosis, cervical region: Secondary | ICD-10-CM | POA: Diagnosis not present

## 2018-12-25 DIAGNOSIS — I10 Essential (primary) hypertension: Secondary | ICD-10-CM | POA: Diagnosis not present

## 2018-12-25 DIAGNOSIS — E11628 Type 2 diabetes mellitus with other skin complications: Secondary | ICD-10-CM | POA: Diagnosis not present

## 2018-12-25 DIAGNOSIS — M47812 Spondylosis without myelopathy or radiculopathy, cervical region: Secondary | ICD-10-CM | POA: Diagnosis not present

## 2018-12-25 DIAGNOSIS — E1165 Type 2 diabetes mellitus with hyperglycemia: Secondary | ICD-10-CM | POA: Diagnosis not present

## 2018-12-26 DIAGNOSIS — E11628 Type 2 diabetes mellitus with other skin complications: Secondary | ICD-10-CM | POA: Diagnosis not present

## 2018-12-26 DIAGNOSIS — N179 Acute kidney failure, unspecified: Secondary | ICD-10-CM | POA: Diagnosis not present

## 2018-12-26 DIAGNOSIS — Z481 Encounter for planned postprocedural wound closure: Secondary | ICD-10-CM | POA: Diagnosis not present

## 2018-12-26 DIAGNOSIS — I10 Essential (primary) hypertension: Secondary | ICD-10-CM | POA: Diagnosis not present

## 2018-12-26 DIAGNOSIS — Z89421 Acquired absence of other right toe(s): Secondary | ICD-10-CM | POA: Diagnosis not present

## 2018-12-27 DIAGNOSIS — T8743 Infection of amputation stump, right lower extremity: Secondary | ICD-10-CM | POA: Diagnosis not present

## 2018-12-27 DIAGNOSIS — N179 Acute kidney failure, unspecified: Secondary | ICD-10-CM | POA: Diagnosis not present

## 2018-12-27 DIAGNOSIS — E1140 Type 2 diabetes mellitus with diabetic neuropathy, unspecified: Secondary | ICD-10-CM | POA: Diagnosis not present

## 2018-12-27 DIAGNOSIS — I10 Essential (primary) hypertension: Secondary | ICD-10-CM | POA: Diagnosis not present

## 2018-12-27 DIAGNOSIS — S91301S Unspecified open wound, right foot, sequela: Secondary | ICD-10-CM | POA: Diagnosis not present

## 2018-12-27 DIAGNOSIS — E11628 Type 2 diabetes mellitus with other skin complications: Secondary | ICD-10-CM | POA: Diagnosis not present

## 2018-12-28 DIAGNOSIS — J9620 Acute and chronic respiratory failure, unspecified whether with hypoxia or hypercapnia: Secondary | ICD-10-CM | POA: Diagnosis not present

## 2018-12-28 DIAGNOSIS — T8740 Infection of amputation stump, unspecified extremity: Secondary | ICD-10-CM | POA: Diagnosis not present

## 2018-12-28 DIAGNOSIS — E1140 Type 2 diabetes mellitus with diabetic neuropathy, unspecified: Secondary | ICD-10-CM | POA: Diagnosis not present

## 2018-12-28 DIAGNOSIS — Z89422 Acquired absence of other left toe(s): Secondary | ICD-10-CM | POA: Diagnosis not present

## 2018-12-28 DIAGNOSIS — Z89512 Acquired absence of left leg below knee: Secondary | ICD-10-CM | POA: Diagnosis not present

## 2018-12-28 DIAGNOSIS — M86172 Other acute osteomyelitis, left ankle and foot: Secondary | ICD-10-CM | POA: Diagnosis not present

## 2018-12-28 DIAGNOSIS — I739 Peripheral vascular disease, unspecified: Secondary | ICD-10-CM | POA: Diagnosis not present

## 2018-12-28 DIAGNOSIS — E669 Obesity, unspecified: Secondary | ICD-10-CM | POA: Diagnosis not present

## 2018-12-28 DIAGNOSIS — J45909 Unspecified asthma, uncomplicated: Secondary | ICD-10-CM | POA: Diagnosis not present

## 2018-12-28 DIAGNOSIS — M86171 Other acute osteomyelitis, right ankle and foot: Secondary | ICD-10-CM | POA: Diagnosis not present

## 2018-12-28 DIAGNOSIS — G4733 Obstructive sleep apnea (adult) (pediatric): Secondary | ICD-10-CM | POA: Diagnosis not present

## 2018-12-29 DIAGNOSIS — E1140 Type 2 diabetes mellitus with diabetic neuropathy, unspecified: Secondary | ICD-10-CM | POA: Diagnosis not present

## 2018-12-30 DIAGNOSIS — E1142 Type 2 diabetes mellitus with diabetic polyneuropathy: Secondary | ICD-10-CM | POA: Diagnosis not present

## 2018-12-30 DIAGNOSIS — Z7984 Long term (current) use of oral hypoglycemic drugs: Secondary | ICD-10-CM | POA: Diagnosis not present

## 2018-12-30 DIAGNOSIS — G2581 Restless legs syndrome: Secondary | ICD-10-CM | POA: Diagnosis not present

## 2018-12-30 DIAGNOSIS — F4323 Adjustment disorder with mixed anxiety and depressed mood: Secondary | ICD-10-CM | POA: Diagnosis not present

## 2018-12-30 DIAGNOSIS — Z89512 Acquired absence of left leg below knee: Secondary | ICD-10-CM | POA: Diagnosis not present

## 2018-12-30 DIAGNOSIS — Z7982 Long term (current) use of aspirin: Secondary | ICD-10-CM | POA: Diagnosis not present

## 2018-12-30 DIAGNOSIS — Z7951 Long term (current) use of inhaled steroids: Secondary | ICD-10-CM | POA: Diagnosis not present

## 2018-12-30 DIAGNOSIS — Z993 Dependence on wheelchair: Secondary | ICD-10-CM | POA: Diagnosis not present

## 2018-12-30 DIAGNOSIS — E1151 Type 2 diabetes mellitus with diabetic peripheral angiopathy without gangrene: Secondary | ICD-10-CM | POA: Diagnosis not present

## 2018-12-30 DIAGNOSIS — J449 Chronic obstructive pulmonary disease, unspecified: Secondary | ICD-10-CM | POA: Diagnosis not present

## 2018-12-30 DIAGNOSIS — Z48812 Encounter for surgical aftercare following surgery on the circulatory system: Secondary | ICD-10-CM | POA: Diagnosis not present

## 2018-12-30 DIAGNOSIS — M1711 Unilateral primary osteoarthritis, right knee: Secondary | ICD-10-CM | POA: Diagnosis not present

## 2018-12-30 DIAGNOSIS — E1140 Type 2 diabetes mellitus with diabetic neuropathy, unspecified: Secondary | ICD-10-CM | POA: Diagnosis not present

## 2018-12-30 DIAGNOSIS — G4733 Obstructive sleep apnea (adult) (pediatric): Secondary | ICD-10-CM | POA: Diagnosis not present

## 2018-12-30 DIAGNOSIS — E785 Hyperlipidemia, unspecified: Secondary | ICD-10-CM | POA: Diagnosis not present

## 2018-12-30 DIAGNOSIS — K219 Gastro-esophageal reflux disease without esophagitis: Secondary | ICD-10-CM | POA: Diagnosis not present

## 2018-12-30 DIAGNOSIS — I10 Essential (primary) hypertension: Secondary | ICD-10-CM | POA: Diagnosis not present

## 2018-12-30 DIAGNOSIS — Z89431 Acquired absence of right foot: Secondary | ICD-10-CM | POA: Diagnosis not present

## 2018-12-30 DIAGNOSIS — Z79891 Long term (current) use of opiate analgesic: Secondary | ICD-10-CM | POA: Diagnosis not present

## 2018-12-30 DIAGNOSIS — E11319 Type 2 diabetes mellitus with unspecified diabetic retinopathy without macular edema: Secondary | ICD-10-CM | POA: Diagnosis not present

## 2018-12-30 DIAGNOSIS — Z4781 Encounter for orthopedic aftercare following surgical amputation: Secondary | ICD-10-CM | POA: Diagnosis not present

## 2018-12-30 DIAGNOSIS — E559 Vitamin D deficiency, unspecified: Secondary | ICD-10-CM | POA: Diagnosis not present

## 2018-12-30 DIAGNOSIS — F319 Bipolar disorder, unspecified: Secondary | ICD-10-CM | POA: Diagnosis not present

## 2018-12-30 DIAGNOSIS — Z87891 Personal history of nicotine dependence: Secondary | ICD-10-CM | POA: Diagnosis not present

## 2018-12-31 DIAGNOSIS — E1140 Type 2 diabetes mellitus with diabetic neuropathy, unspecified: Secondary | ICD-10-CM | POA: Diagnosis not present

## 2019-01-01 ENCOUNTER — Telehealth: Payer: Self-pay

## 2019-01-01 DIAGNOSIS — E559 Vitamin D deficiency, unspecified: Secondary | ICD-10-CM | POA: Diagnosis not present

## 2019-01-01 DIAGNOSIS — Z87891 Personal history of nicotine dependence: Secondary | ICD-10-CM | POA: Diagnosis not present

## 2019-01-01 DIAGNOSIS — Z79891 Long term (current) use of opiate analgesic: Secondary | ICD-10-CM | POA: Diagnosis not present

## 2019-01-01 DIAGNOSIS — Z48812 Encounter for surgical aftercare following surgery on the circulatory system: Secondary | ICD-10-CM | POA: Diagnosis not present

## 2019-01-01 DIAGNOSIS — Z7951 Long term (current) use of inhaled steroids: Secondary | ICD-10-CM | POA: Diagnosis not present

## 2019-01-01 DIAGNOSIS — G4733 Obstructive sleep apnea (adult) (pediatric): Secondary | ICD-10-CM | POA: Diagnosis not present

## 2019-01-01 DIAGNOSIS — E1140 Type 2 diabetes mellitus with diabetic neuropathy, unspecified: Secondary | ICD-10-CM | POA: Diagnosis not present

## 2019-01-01 DIAGNOSIS — Z7984 Long term (current) use of oral hypoglycemic drugs: Secondary | ICD-10-CM | POA: Diagnosis not present

## 2019-01-01 DIAGNOSIS — Z7982 Long term (current) use of aspirin: Secondary | ICD-10-CM | POA: Diagnosis not present

## 2019-01-01 DIAGNOSIS — I10 Essential (primary) hypertension: Secondary | ICD-10-CM | POA: Diagnosis not present

## 2019-01-01 DIAGNOSIS — E1142 Type 2 diabetes mellitus with diabetic polyneuropathy: Secondary | ICD-10-CM | POA: Diagnosis not present

## 2019-01-01 DIAGNOSIS — K219 Gastro-esophageal reflux disease without esophagitis: Secondary | ICD-10-CM | POA: Diagnosis not present

## 2019-01-01 DIAGNOSIS — Z4781 Encounter for orthopedic aftercare following surgical amputation: Secondary | ICD-10-CM | POA: Diagnosis not present

## 2019-01-01 DIAGNOSIS — F4323 Adjustment disorder with mixed anxiety and depressed mood: Secondary | ICD-10-CM | POA: Diagnosis not present

## 2019-01-01 DIAGNOSIS — E1151 Type 2 diabetes mellitus with diabetic peripheral angiopathy without gangrene: Secondary | ICD-10-CM | POA: Diagnosis not present

## 2019-01-01 DIAGNOSIS — G2581 Restless legs syndrome: Secondary | ICD-10-CM | POA: Diagnosis not present

## 2019-01-01 DIAGNOSIS — E785 Hyperlipidemia, unspecified: Secondary | ICD-10-CM | POA: Diagnosis not present

## 2019-01-01 DIAGNOSIS — M1711 Unilateral primary osteoarthritis, right knee: Secondary | ICD-10-CM | POA: Diagnosis not present

## 2019-01-01 DIAGNOSIS — E11319 Type 2 diabetes mellitus with unspecified diabetic retinopathy without macular edema: Secondary | ICD-10-CM | POA: Diagnosis not present

## 2019-01-01 DIAGNOSIS — Z89512 Acquired absence of left leg below knee: Secondary | ICD-10-CM | POA: Diagnosis not present

## 2019-01-01 DIAGNOSIS — F319 Bipolar disorder, unspecified: Secondary | ICD-10-CM | POA: Diagnosis not present

## 2019-01-01 DIAGNOSIS — J449 Chronic obstructive pulmonary disease, unspecified: Secondary | ICD-10-CM | POA: Diagnosis not present

## 2019-01-01 DIAGNOSIS — Z89431 Acquired absence of right foot: Secondary | ICD-10-CM | POA: Diagnosis not present

## 2019-01-01 DIAGNOSIS — Z993 Dependence on wheelchair: Secondary | ICD-10-CM | POA: Diagnosis not present

## 2019-01-01 NOTE — Telephone Encounter (Signed)
Called and gave authorization for patient with Darlene at [facility] per Dr. [name].  [name], CMA   .Marland Kitchen

## 2019-01-01 NOTE — Telephone Encounter (Signed)
Please authorize these orders, thanks! Dashawn Bartnick J Cian Costanzo, MD  

## 2019-01-01 NOTE — Telephone Encounter (Signed)
Carlyon Shadow, nurse with [facility], called nurse line requesting verbal orders for patient.  Skilled nursing 1 week 9 PT/OT evaluation   All verbals can be called to [place], ok to leave a detailed message.

## 2019-01-02 ENCOUNTER — Ambulatory Visit (INDEPENDENT_AMBULATORY_CARE_PROVIDER_SITE_OTHER): Payer: Medicaid Other | Admitting: Family Medicine

## 2019-01-02 VITALS — BP 126/80 | HR 98 | Temp 97.8°F

## 2019-01-02 DIAGNOSIS — N179 Acute kidney failure, unspecified: Secondary | ICD-10-CM

## 2019-01-02 DIAGNOSIS — S91301S Unspecified open wound, right foot, sequela: Secondary | ICD-10-CM

## 2019-01-02 DIAGNOSIS — E1140 Type 2 diabetes mellitus with diabetic neuropathy, unspecified: Secondary | ICD-10-CM | POA: Diagnosis not present

## 2019-01-02 MED ORDER — HYDROCODONE-ACETAMINOPHEN 10-325 MG PO TABS: 1.0000 | ORAL_TABLET | Freq: Three times a day (TID) | ORAL | 0 refills | Status: DC | PRN

## 2019-01-02 MED ORDER — NYSTATIN 100000 UNIT/GM EX CREA: 1.0000 "application " | TOPICAL_CREAM | Freq: Two times a day (BID) | CUTANEOUS | 2 refills | Status: DC

## 2019-01-02 NOTE — Progress Notes (Signed)
Date of Visit: 01/02/2019   HPI:  [name] presents for hospital follow up. Was hospitalized from 12/30 to 1/1 at [facility], and then was transferred to [facility] for further management of non-healing wound of foot. Discharged on 1/22.  1. Foot infection - underwent transmetatarsal amputation. Discharged on 6 week course of moxifloxacin (stop date 2/13). Patient reports adherence with this medication. No fevers. Has question about whether her sutures look okay today. Daughter helps with wound care. Is now out of pain medication that she was discharged with. Would like to resume her chronic pain medication. Needs refill of this today.  2. AKI - had severe AKI while hospitalized. Needs BMET rechecked today. Currently not on ACE or ARB due to the AKI. Gabapentin dosing has also been reduced.   ROS: See HPI.  [place]: history of obesity, type 2 diabetes, prior BKA, recent transmetatarsal amputation  PHYSICAL EXAM: BP 126/80   Pulse 98   Temp 97.8 F (36.6 C) (Oral)   SpO2 100%  Gen: no acute distress, pleasant, cooperative, well appearing HEENT: normocephalic, atraumatic  Heart: regular rate and rhythm  Lungs: clear to auscultation bilaterally  Extremities: R foot s/p transmetatarsal amputation. Sutures remain in place. No drainage, warmth, or erythema.  ASSESSMENT/PLAN:  Health maintenance:  -will [facility]  R transmetarsal amputation Continues on moxifloxacin Refill home pain medication (one months worth) - [place] PMP reviewed with appropriate findings Incision looks good today Follow up with vascular surgery as scheduled, follow up with me in 1 month   AKI Recheck bmet today to monitor renal function   FOLLOW UP: Follow up in 1 mo for above issues  [name], [place]

## 2019-01-03 ENCOUNTER — Telehealth: Payer: Self-pay | Admitting: Family Medicine

## 2019-01-03 DIAGNOSIS — E1142 Type 2 diabetes mellitus with diabetic polyneuropathy: Secondary | ICD-10-CM | POA: Diagnosis not present

## 2019-01-03 DIAGNOSIS — G4733 Obstructive sleep apnea (adult) (pediatric): Secondary | ICD-10-CM | POA: Diagnosis not present

## 2019-01-03 DIAGNOSIS — Z993 Dependence on wheelchair: Secondary | ICD-10-CM | POA: Diagnosis not present

## 2019-01-03 DIAGNOSIS — E1140 Type 2 diabetes mellitus with diabetic neuropathy, unspecified: Secondary | ICD-10-CM | POA: Diagnosis not present

## 2019-01-03 DIAGNOSIS — E559 Vitamin D deficiency, unspecified: Secondary | ICD-10-CM | POA: Diagnosis not present

## 2019-01-03 DIAGNOSIS — G2581 Restless legs syndrome: Secondary | ICD-10-CM | POA: Diagnosis not present

## 2019-01-03 DIAGNOSIS — Z7984 Long term (current) use of oral hypoglycemic drugs: Secondary | ICD-10-CM | POA: Diagnosis not present

## 2019-01-03 DIAGNOSIS — Z7982 Long term (current) use of aspirin: Secondary | ICD-10-CM | POA: Diagnosis not present

## 2019-01-03 DIAGNOSIS — E785 Hyperlipidemia, unspecified: Secondary | ICD-10-CM | POA: Diagnosis not present

## 2019-01-03 DIAGNOSIS — J449 Chronic obstructive pulmonary disease, unspecified: Secondary | ICD-10-CM | POA: Diagnosis not present

## 2019-01-03 DIAGNOSIS — M1711 Unilateral primary osteoarthritis, right knee: Secondary | ICD-10-CM | POA: Diagnosis not present

## 2019-01-03 DIAGNOSIS — Z7951 Long term (current) use of inhaled steroids: Secondary | ICD-10-CM | POA: Diagnosis not present

## 2019-01-03 DIAGNOSIS — F4323 Adjustment disorder with mixed anxiety and depressed mood: Secondary | ICD-10-CM | POA: Diagnosis not present

## 2019-01-03 DIAGNOSIS — E1151 Type 2 diabetes mellitus with diabetic peripheral angiopathy without gangrene: Secondary | ICD-10-CM | POA: Diagnosis not present

## 2019-01-03 DIAGNOSIS — Z48812 Encounter for surgical aftercare following surgery on the circulatory system: Secondary | ICD-10-CM | POA: Diagnosis not present

## 2019-01-03 DIAGNOSIS — F319 Bipolar disorder, unspecified: Secondary | ICD-10-CM | POA: Diagnosis not present

## 2019-01-03 DIAGNOSIS — Z79891 Long term (current) use of opiate analgesic: Secondary | ICD-10-CM | POA: Diagnosis not present

## 2019-01-03 DIAGNOSIS — E11319 Type 2 diabetes mellitus with unspecified diabetic retinopathy without macular edema: Secondary | ICD-10-CM | POA: Diagnosis not present

## 2019-01-03 DIAGNOSIS — K219 Gastro-esophageal reflux disease without esophagitis: Secondary | ICD-10-CM | POA: Diagnosis not present

## 2019-01-03 DIAGNOSIS — Z87891 Personal history of nicotine dependence: Secondary | ICD-10-CM | POA: Diagnosis not present

## 2019-01-03 DIAGNOSIS — Z89512 Acquired absence of left leg below knee: Secondary | ICD-10-CM | POA: Diagnosis not present

## 2019-01-03 DIAGNOSIS — Z89431 Acquired absence of right foot: Secondary | ICD-10-CM | POA: Diagnosis not present

## 2019-01-03 DIAGNOSIS — I10 Essential (primary) hypertension: Secondary | ICD-10-CM | POA: Diagnosis not present

## 2019-01-03 DIAGNOSIS — Z4781 Encounter for orthopedic aftercare following surgical amputation: Secondary | ICD-10-CM | POA: Diagnosis not present

## 2019-01-03 LAB — CBC
HEMOGLOBIN: 10.2 g/dL — AB (ref 11.1–15.9)
Hematocrit: 31.3 % — ABNORMAL LOW (ref 34.0–46.6)
MCH: 28.8 pg (ref 26.6–33.0)
MCHC: 32.6 g/dL (ref 31.5–35.7)
MCV: 88 fL (ref 79–97)
Platelets: 421 10*3/uL (ref 150–450)
RBC: 3.54 x10E6/uL — ABNORMAL LOW (ref 3.77–5.28)
RDW: 13.6 % (ref 11.7–15.4)
WBC: 11.2 10*3/uL — ABNORMAL HIGH (ref 3.4–10.8)

## 2019-01-03 LAB — BASIC METABOLIC PANEL
BUN/Creatinine Ratio: 21 (ref 9–23)
BUN: 22 mg/dL (ref 6–24)
CO2: 23 mmol/L (ref 20–29)
Calcium: 9.5 mg/dL (ref 8.7–10.2)
Chloride: 98 mmol/L (ref 96–106)
Creatinine, Ser: 1.03 mg/dL — ABNORMAL HIGH (ref 0.57–1.00)
GFR calc Af Amer: 79 mL/min/{1.73_m2} (ref >59)
GFR calc non Af Amer: 68 mL/min/{1.73_m2} (ref >59)
Glucose: 330 mg/dL — ABNORMAL HIGH (ref 65–99)
Potassium: 4.5 mmol/L (ref 3.5–5.2)
Sodium: 138 mmol/L (ref 134–144)

## 2019-01-03 MED ORDER — CYCLOBENZAPRINE HCL 5 MG PO TABS: 5.0000 mg | ORAL_TABLET | Freq: Three times a day (TID) | ORAL | 1 refills | Status: DC | PRN

## 2019-01-03 NOTE — Telephone Encounter (Signed)
Called patient earlier today to let her know kidney function is good. She requested rx for flexeril as she's had muscle spasms (has been stuck laying in bed lots lately since having transmetatarsal amputation).  Will rx flexeril. She will follow up with me in 3-4 weeks. Patient appreciative [name], MD

## 2019-01-04 DIAGNOSIS — G4733 Obstructive sleep apnea (adult) (pediatric): Secondary | ICD-10-CM | POA: Diagnosis not present

## 2019-01-04 DIAGNOSIS — E1151 Type 2 diabetes mellitus with diabetic peripheral angiopathy without gangrene: Secondary | ICD-10-CM | POA: Diagnosis not present

## 2019-01-04 DIAGNOSIS — E785 Hyperlipidemia, unspecified: Secondary | ICD-10-CM | POA: Diagnosis not present

## 2019-01-04 DIAGNOSIS — E1140 Type 2 diabetes mellitus with diabetic neuropathy, unspecified: Secondary | ICD-10-CM | POA: Diagnosis not present

## 2019-01-04 DIAGNOSIS — M1711 Unilateral primary osteoarthritis, right knee: Secondary | ICD-10-CM | POA: Diagnosis not present

## 2019-01-04 DIAGNOSIS — K219 Gastro-esophageal reflux disease without esophagitis: Secondary | ICD-10-CM | POA: Diagnosis not present

## 2019-01-04 DIAGNOSIS — Z48812 Encounter for surgical aftercare following surgery on the circulatory system: Secondary | ICD-10-CM | POA: Diagnosis not present

## 2019-01-04 DIAGNOSIS — Z7982 Long term (current) use of aspirin: Secondary | ICD-10-CM | POA: Diagnosis not present

## 2019-01-04 DIAGNOSIS — Z89512 Acquired absence of left leg below knee: Secondary | ICD-10-CM | POA: Diagnosis not present

## 2019-01-04 DIAGNOSIS — J449 Chronic obstructive pulmonary disease, unspecified: Secondary | ICD-10-CM | POA: Diagnosis not present

## 2019-01-04 DIAGNOSIS — I10 Essential (primary) hypertension: Secondary | ICD-10-CM | POA: Diagnosis not present

## 2019-01-04 DIAGNOSIS — Z7984 Long term (current) use of oral hypoglycemic drugs: Secondary | ICD-10-CM | POA: Diagnosis not present

## 2019-01-04 DIAGNOSIS — G2581 Restless legs syndrome: Secondary | ICD-10-CM | POA: Diagnosis not present

## 2019-01-04 DIAGNOSIS — E1142 Type 2 diabetes mellitus with diabetic polyneuropathy: Secondary | ICD-10-CM | POA: Diagnosis not present

## 2019-01-04 DIAGNOSIS — Z993 Dependence on wheelchair: Secondary | ICD-10-CM | POA: Diagnosis not present

## 2019-01-04 DIAGNOSIS — Z89431 Acquired absence of right foot: Secondary | ICD-10-CM | POA: Diagnosis not present

## 2019-01-04 DIAGNOSIS — F319 Bipolar disorder, unspecified: Secondary | ICD-10-CM | POA: Diagnosis not present

## 2019-01-04 DIAGNOSIS — E11319 Type 2 diabetes mellitus with unspecified diabetic retinopathy without macular edema: Secondary | ICD-10-CM | POA: Diagnosis not present

## 2019-01-04 DIAGNOSIS — Z87891 Personal history of nicotine dependence: Secondary | ICD-10-CM | POA: Diagnosis not present

## 2019-01-04 DIAGNOSIS — Z4781 Encounter for orthopedic aftercare following surgical amputation: Secondary | ICD-10-CM | POA: Diagnosis not present

## 2019-01-04 DIAGNOSIS — E559 Vitamin D deficiency, unspecified: Secondary | ICD-10-CM | POA: Diagnosis not present

## 2019-01-04 DIAGNOSIS — Z7951 Long term (current) use of inhaled steroids: Secondary | ICD-10-CM | POA: Diagnosis not present

## 2019-01-04 DIAGNOSIS — Z79891 Long term (current) use of opiate analgesic: Secondary | ICD-10-CM | POA: Diagnosis not present

## 2019-01-04 DIAGNOSIS — F4323 Adjustment disorder with mixed anxiety and depressed mood: Secondary | ICD-10-CM | POA: Diagnosis not present

## 2019-01-05 DIAGNOSIS — E1140 Type 2 diabetes mellitus with diabetic neuropathy, unspecified: Secondary | ICD-10-CM | POA: Diagnosis not present

## 2019-01-06 DIAGNOSIS — Z89422 Acquired absence of other left toe(s): Secondary | ICD-10-CM | POA: Diagnosis not present

## 2019-01-06 DIAGNOSIS — E669 Obesity, unspecified: Secondary | ICD-10-CM | POA: Diagnosis not present

## 2019-01-06 DIAGNOSIS — E1140 Type 2 diabetes mellitus with diabetic neuropathy, unspecified: Secondary | ICD-10-CM | POA: Diagnosis not present

## 2019-01-06 DIAGNOSIS — Z89512 Acquired absence of left leg below knee: Secondary | ICD-10-CM | POA: Diagnosis not present

## 2019-01-06 DIAGNOSIS — M86172 Other acute osteomyelitis, left ankle and foot: Secondary | ICD-10-CM | POA: Diagnosis not present

## 2019-01-06 DIAGNOSIS — J9620 Acute and chronic respiratory failure, unspecified whether with hypoxia or hypercapnia: Secondary | ICD-10-CM | POA: Diagnosis not present

## 2019-01-06 DIAGNOSIS — M86171 Other acute osteomyelitis, right ankle and foot: Secondary | ICD-10-CM | POA: Diagnosis not present

## 2019-01-06 DIAGNOSIS — G4733 Obstructive sleep apnea (adult) (pediatric): Secondary | ICD-10-CM | POA: Diagnosis not present

## 2019-01-06 DIAGNOSIS — J45909 Unspecified asthma, uncomplicated: Secondary | ICD-10-CM | POA: Diagnosis not present

## 2019-01-06 DIAGNOSIS — I739 Peripheral vascular disease, unspecified: Secondary | ICD-10-CM | POA: Diagnosis not present

## 2019-01-06 DIAGNOSIS — T8740 Infection of amputation stump, unspecified extremity: Secondary | ICD-10-CM | POA: Diagnosis not present

## 2019-01-07 DIAGNOSIS — E1140 Type 2 diabetes mellitus with diabetic neuropathy, unspecified: Secondary | ICD-10-CM | POA: Diagnosis not present

## 2019-01-08 DIAGNOSIS — E1140 Type 2 diabetes mellitus with diabetic neuropathy, unspecified: Secondary | ICD-10-CM | POA: Diagnosis not present

## 2019-01-09 ENCOUNTER — Telehealth: Payer: Self-pay | Admitting: Family Medicine

## 2019-01-09 DIAGNOSIS — E1140 Type 2 diabetes mellitus with diabetic neuropathy, unspecified: Secondary | ICD-10-CM | POA: Diagnosis not present

## 2019-01-09 NOTE — Telephone Encounter (Signed)
Pt would like for Dr. [name] to call her. I asked if there was anything I could note for Dr. [name] and she said she would rather just discuss it with her.

## 2019-01-10 DIAGNOSIS — Z7984 Long term (current) use of oral hypoglycemic drugs: Secondary | ICD-10-CM | POA: Diagnosis not present

## 2019-01-10 DIAGNOSIS — E11319 Type 2 diabetes mellitus with unspecified diabetic retinopathy without macular edema: Secondary | ICD-10-CM | POA: Diagnosis not present

## 2019-01-10 DIAGNOSIS — Z7982 Long term (current) use of aspirin: Secondary | ICD-10-CM | POA: Diagnosis not present

## 2019-01-10 DIAGNOSIS — Z87891 Personal history of nicotine dependence: Secondary | ICD-10-CM | POA: Diagnosis not present

## 2019-01-10 DIAGNOSIS — Z7951 Long term (current) use of inhaled steroids: Secondary | ICD-10-CM | POA: Diagnosis not present

## 2019-01-10 DIAGNOSIS — Z89431 Acquired absence of right foot: Secondary | ICD-10-CM | POA: Diagnosis not present

## 2019-01-10 DIAGNOSIS — E785 Hyperlipidemia, unspecified: Secondary | ICD-10-CM | POA: Diagnosis not present

## 2019-01-10 DIAGNOSIS — M1711 Unilateral primary osteoarthritis, right knee: Secondary | ICD-10-CM | POA: Diagnosis not present

## 2019-01-10 DIAGNOSIS — Z89512 Acquired absence of left leg below knee: Secondary | ICD-10-CM | POA: Diagnosis not present

## 2019-01-10 DIAGNOSIS — Z4781 Encounter for orthopedic aftercare following surgical amputation: Secondary | ICD-10-CM | POA: Diagnosis not present

## 2019-01-10 DIAGNOSIS — J449 Chronic obstructive pulmonary disease, unspecified: Secondary | ICD-10-CM | POA: Diagnosis not present

## 2019-01-10 DIAGNOSIS — G2581 Restless legs syndrome: Secondary | ICD-10-CM | POA: Diagnosis not present

## 2019-01-10 DIAGNOSIS — E1142 Type 2 diabetes mellitus with diabetic polyneuropathy: Secondary | ICD-10-CM | POA: Diagnosis not present

## 2019-01-10 DIAGNOSIS — Z48812 Encounter for surgical aftercare following surgery on the circulatory system: Secondary | ICD-10-CM | POA: Diagnosis not present

## 2019-01-10 DIAGNOSIS — I10 Essential (primary) hypertension: Secondary | ICD-10-CM | POA: Diagnosis not present

## 2019-01-10 DIAGNOSIS — F319 Bipolar disorder, unspecified: Secondary | ICD-10-CM | POA: Diagnosis not present

## 2019-01-10 DIAGNOSIS — Z79891 Long term (current) use of opiate analgesic: Secondary | ICD-10-CM | POA: Diagnosis not present

## 2019-01-10 DIAGNOSIS — K219 Gastro-esophageal reflux disease without esophagitis: Secondary | ICD-10-CM | POA: Diagnosis not present

## 2019-01-10 DIAGNOSIS — E1151 Type 2 diabetes mellitus with diabetic peripheral angiopathy without gangrene: Secondary | ICD-10-CM | POA: Diagnosis not present

## 2019-01-10 DIAGNOSIS — E559 Vitamin D deficiency, unspecified: Secondary | ICD-10-CM | POA: Diagnosis not present

## 2019-01-10 DIAGNOSIS — G4733 Obstructive sleep apnea (adult) (pediatric): Secondary | ICD-10-CM | POA: Diagnosis not present

## 2019-01-10 DIAGNOSIS — Z993 Dependence on wheelchair: Secondary | ICD-10-CM | POA: Diagnosis not present

## 2019-01-10 DIAGNOSIS — E1140 Type 2 diabetes mellitus with diabetic neuropathy, unspecified: Secondary | ICD-10-CM | POA: Diagnosis not present

## 2019-01-10 DIAGNOSIS — F4323 Adjustment disorder with mixed anxiety and depressed mood: Secondary | ICD-10-CM | POA: Diagnosis not present

## 2019-01-11 DIAGNOSIS — E1140 Type 2 diabetes mellitus with diabetic neuropathy, unspecified: Secondary | ICD-10-CM | POA: Diagnosis not present

## 2019-01-11 NOTE — Telephone Encounter (Signed)
Called patient back, no answer. LVM offering for her to call back. [name], MD

## 2019-01-12 DIAGNOSIS — E1140 Type 2 diabetes mellitus with diabetic neuropathy, unspecified: Secondary | ICD-10-CM | POA: Diagnosis not present

## 2019-01-13 DIAGNOSIS — E1140 Type 2 diabetes mellitus with diabetic neuropathy, unspecified: Secondary | ICD-10-CM | POA: Diagnosis not present

## 2019-01-14 DIAGNOSIS — E1140 Type 2 diabetes mellitus with diabetic neuropathy, unspecified: Secondary | ICD-10-CM | POA: Diagnosis not present

## 2019-01-15 DIAGNOSIS — E1140 Type 2 diabetes mellitus with diabetic neuropathy, unspecified: Secondary | ICD-10-CM | POA: Diagnosis not present

## 2019-01-16 DIAGNOSIS — E1140 Type 2 diabetes mellitus with diabetic neuropathy, unspecified: Secondary | ICD-10-CM | POA: Diagnosis not present

## 2019-01-17 DIAGNOSIS — I10 Essential (primary) hypertension: Secondary | ICD-10-CM | POA: Diagnosis not present

## 2019-01-17 DIAGNOSIS — Z89431 Acquired absence of right foot: Secondary | ICD-10-CM | POA: Diagnosis not present

## 2019-01-17 DIAGNOSIS — E785 Hyperlipidemia, unspecified: Secondary | ICD-10-CM | POA: Diagnosis not present

## 2019-01-17 DIAGNOSIS — E559 Vitamin D deficiency, unspecified: Secondary | ICD-10-CM | POA: Diagnosis not present

## 2019-01-17 DIAGNOSIS — J449 Chronic obstructive pulmonary disease, unspecified: Secondary | ICD-10-CM | POA: Diagnosis not present

## 2019-01-17 DIAGNOSIS — Z79891 Long term (current) use of opiate analgesic: Secondary | ICD-10-CM | POA: Diagnosis not present

## 2019-01-17 DIAGNOSIS — F319 Bipolar disorder, unspecified: Secondary | ICD-10-CM | POA: Diagnosis not present

## 2019-01-17 DIAGNOSIS — Z89512 Acquired absence of left leg below knee: Secondary | ICD-10-CM | POA: Diagnosis not present

## 2019-01-17 DIAGNOSIS — E11319 Type 2 diabetes mellitus with unspecified diabetic retinopathy without macular edema: Secondary | ICD-10-CM | POA: Diagnosis not present

## 2019-01-17 DIAGNOSIS — Z87891 Personal history of nicotine dependence: Secondary | ICD-10-CM | POA: Diagnosis not present

## 2019-01-17 DIAGNOSIS — Z7951 Long term (current) use of inhaled steroids: Secondary | ICD-10-CM | POA: Diagnosis not present

## 2019-01-17 DIAGNOSIS — Z993 Dependence on wheelchair: Secondary | ICD-10-CM | POA: Diagnosis not present

## 2019-01-17 DIAGNOSIS — Z4781 Encounter for orthopedic aftercare following surgical amputation: Secondary | ICD-10-CM | POA: Diagnosis not present

## 2019-01-17 DIAGNOSIS — E1140 Type 2 diabetes mellitus with diabetic neuropathy, unspecified: Secondary | ICD-10-CM | POA: Diagnosis not present

## 2019-01-17 DIAGNOSIS — Z7984 Long term (current) use of oral hypoglycemic drugs: Secondary | ICD-10-CM | POA: Diagnosis not present

## 2019-01-17 DIAGNOSIS — M1711 Unilateral primary osteoarthritis, right knee: Secondary | ICD-10-CM | POA: Diagnosis not present

## 2019-01-17 DIAGNOSIS — E1142 Type 2 diabetes mellitus with diabetic polyneuropathy: Secondary | ICD-10-CM | POA: Diagnosis not present

## 2019-01-17 DIAGNOSIS — F4323 Adjustment disorder with mixed anxiety and depressed mood: Secondary | ICD-10-CM | POA: Diagnosis not present

## 2019-01-17 DIAGNOSIS — K219 Gastro-esophageal reflux disease without esophagitis: Secondary | ICD-10-CM | POA: Diagnosis not present

## 2019-01-17 DIAGNOSIS — Z7982 Long term (current) use of aspirin: Secondary | ICD-10-CM | POA: Diagnosis not present

## 2019-01-17 DIAGNOSIS — G4733 Obstructive sleep apnea (adult) (pediatric): Secondary | ICD-10-CM | POA: Diagnosis not present

## 2019-01-17 DIAGNOSIS — G2581 Restless legs syndrome: Secondary | ICD-10-CM | POA: Diagnosis not present

## 2019-01-17 DIAGNOSIS — Z48812 Encounter for surgical aftercare following surgery on the circulatory system: Secondary | ICD-10-CM | POA: Diagnosis not present

## 2019-01-17 DIAGNOSIS — E1151 Type 2 diabetes mellitus with diabetic peripheral angiopathy without gangrene: Secondary | ICD-10-CM | POA: Diagnosis not present

## 2019-01-18 ENCOUNTER — Encounter: Payer: Self-pay | Admitting: Physical Therapy

## 2019-01-18 DIAGNOSIS — E1140 Type 2 diabetes mellitus with diabetic neuropathy, unspecified: Secondary | ICD-10-CM | POA: Diagnosis not present

## 2019-01-18 NOTE — Therapy (Signed)
[facility] [address] [place], [place], [address] Phone: [phone]   Fax:  [phone]  Patient Details  Name: [name] MRN: [id number] Date of Birth: 04-Apr-1976 Referring Provider:  Rolena Infante, MD  Encounter Date: 01/18/2019    PHYSICAL THERAPY DISCHARGE SUMMARY  Visits from Start of Care: 5  Current functional level related to goals / functional outcomes: See last PT note on 10/12/2018   Remaining deficits: See last PT note on 10/12/2018   Education / Equipment: Prosthetic Care & HEP  Plan: Patient agrees to discharge.  Patient goals were not met. Patient is being discharged due to not returning since the last visit.  ?????        Javeah Loeza PT, DPT 01/18/2019, 11:54 AM  [facility] [address] [place], [place], [address] Phone: [phone]   Fax:  [phone]

## 2019-01-19 DIAGNOSIS — E1140 Type 2 diabetes mellitus with diabetic neuropathy, unspecified: Secondary | ICD-10-CM | POA: Diagnosis not present

## 2019-01-20 DIAGNOSIS — E1140 Type 2 diabetes mellitus with diabetic neuropathy, unspecified: Secondary | ICD-10-CM | POA: Diagnosis not present

## 2019-01-21 DIAGNOSIS — E1140 Type 2 diabetes mellitus with diabetic neuropathy, unspecified: Secondary | ICD-10-CM | POA: Diagnosis not present

## 2019-01-22 ENCOUNTER — Ambulatory Visit: Payer: Self-pay | Admitting: Family Medicine

## 2019-01-22 DIAGNOSIS — E1140 Type 2 diabetes mellitus with diabetic neuropathy, unspecified: Secondary | ICD-10-CM | POA: Diagnosis not present

## 2019-01-23 ENCOUNTER — Ambulatory Visit (INDEPENDENT_AMBULATORY_CARE_PROVIDER_SITE_OTHER): Payer: Medicaid Other | Admitting: Family Medicine

## 2019-01-23 DIAGNOSIS — E1151 Type 2 diabetes mellitus with diabetic peripheral angiopathy without gangrene: Secondary | ICD-10-CM | POA: Diagnosis not present

## 2019-01-23 DIAGNOSIS — E1140 Type 2 diabetes mellitus with diabetic neuropathy, unspecified: Secondary | ICD-10-CM | POA: Diagnosis not present

## 2019-01-23 DIAGNOSIS — E1165 Type 2 diabetes mellitus with hyperglycemia: Secondary | ICD-10-CM

## 2019-01-23 DIAGNOSIS — G8929 Other chronic pain: Secondary | ICD-10-CM | POA: Diagnosis not present

## 2019-01-23 DIAGNOSIS — IMO0002 Reserved for concepts with insufficient information to code with codable children: Secondary | ICD-10-CM

## 2019-01-23 DIAGNOSIS — E1142 Type 2 diabetes mellitus with diabetic polyneuropathy: Secondary | ICD-10-CM

## 2019-01-23 MED ORDER — HYDROCODONE-ACETAMINOPHEN 10-325 MG PO TABS: 1.0000 | ORAL_TABLET | Freq: Three times a day (TID) | ORAL | 0 refills | Status: DC | PRN

## 2019-01-23 MED ORDER — GABAPENTIN 600 MG PO TABS: 1200.0000 mg | ORAL_TABLET | Freq: Three times a day (TID) | ORAL | 3 refills | Status: DC

## 2019-01-23 NOTE — Progress Notes (Signed)
Date of Visit: 01/23/2019   HPI:  Patient presents for routine follow up.  Diabetes - currently taking 70/30 60 units in AM and 40 units in PM. Sugars are running 170s-200s while fasting and about 250s two hours after eating. Patient would like referral to endocrinology here in [place]. Did not attend her scheduled appointment for endo at [facility]. She believes she needs some mealtime coverage.  Neuropathy - has been on lower dose of gabapentin since having a big AKI in the hospital at [facility], but wants to go back to her prior dose as her neuropathy has been bothering her a lot more lately on the lower dose.  Chronic pain - due for refill of norco 10-325mg  three times daily in a few weeks. She is storing the medication safely. It helps with her pain. No adverse side effects. No difficulty controlling how much she is taking the medication. Helps with postoperative pain. Using L prosthesis for BKA.  ROS: See HPI.  [place]: history of type 2 diabetes, hyperlipidemia, hypertension, obesity, depression, asthma, GERD, chornic pain, OHS, OSA  PHYSICAL EXAM: BP 115/80   Pulse 92   Temp 98 F (36.7 C) (Oral)   Wt (!) 303 lb 12.8 oz (137.8 kg)   SpO2 99%   BMI 55.55 kg/m  Gen: no acute distress, pleasant, cooperative, well appearing HEENT: normocephalic, atraumatic, moist mucous membranes  Lungs: normal work of breathing  Neuro: alert, speech normal Ext: L BKA with prosthesis in place. R foot s/p transmetatarsal amputation, in postop shoe and wrapped (did not unwrap)  ASSESSMENT/PLAN:  Health maintenance:  -advised to schedule her pap smear (gets these done at [facility]) -declined flu shot today  DM (diabetes mellitus) type II uncontrolled, periph vascular disorder ([place]) Needs better glycemic control. Her history is very complicated. Will refer to endocrinology here locally (she prefers not to see her prior endocrinologist Dr. Loanne Drilling). Hold on adding sliding scale at this time due to her long  acting insulin being 70/30.  Encounter for chronic pain management [place] Controlled Substance Database reviewed, findings are appropriate.  Refilled norco for 3 month supply, space 30 days apart. Follow up with me in 3 months.   Diabetic neuropathy ([place]) Resume prior dose of gabapentin 1200mg  three times daily since renal function is now normal and patient needing higher dose to control symptoms.   FOLLOW UP: Follow up in 3 mos for chronic pain  [name], [place]

## 2019-01-23 NOTE — Patient Instructions (Addendum)
  Referring to endocrinology  Refilled pain medication for 3 months  Increasing dose of gabapentin back to prior dose - sent in prescription  Be well, Dr. [name]

## 2019-01-24 DIAGNOSIS — E1140 Type 2 diabetes mellitus with diabetic neuropathy, unspecified: Secondary | ICD-10-CM | POA: Diagnosis not present

## 2019-01-24 NOTE — Assessment & Plan Note (Signed)
Needs better glycemic control. Her history is very complicated. Will refer to endocrinology here locally (she prefers not to see her prior endocrinologist Dr. Loanne Drilling). Hold on adding sliding scale at this time due to her long acting insulin being 70/30.

## 2019-01-24 NOTE — Assessment & Plan Note (Addendum)
>>  ASSESSMENT AND PLAN FOR TYPE 2 DIABETES MELLITUS WITH DIABETIC NEUROPATHY, UNSPECIFIED ([place]) WRITTEN ON 01/24/2019  5:35 PM BY Ronda Rajkumar, [name], MD  Resume prior dose of gabapentin 1200mg  three times daily since renal function is now normal and patient needing higher dose to control symptoms.   >>ASSESSMENT AND PLAN FOR INSULIN DEPENDENT TYPE 2 DIABETES MELLITUS ([place]) WRITTEN ON 01/24/2019  5:34 PM BY Johnchristopher Sarvis, [name], MD  Needs better glycemic control. Her history is very complicated. Will refer to endocrinology here locally (she prefers not to see her prior endocrinologist Dr. Loanne Drilling). Hold on adding sliding scale at this time due to her long acting insulin being 70/30.

## 2019-01-24 NOTE — Assessment & Plan Note (Signed)
[place] Controlled Substance Database reviewed, findings are appropriate.  Refilled norco for 3 month supply, space 30 days apart. Follow up with me in 3 months.

## 2019-01-25 DIAGNOSIS — E1140 Type 2 diabetes mellitus with diabetic neuropathy, unspecified: Secondary | ICD-10-CM | POA: Diagnosis not present

## 2019-01-25 DIAGNOSIS — Z89431 Acquired absence of right foot: Secondary | ICD-10-CM | POA: Diagnosis not present

## 2019-01-26 DIAGNOSIS — M1711 Unilateral primary osteoarthritis, right knee: Secondary | ICD-10-CM | POA: Diagnosis not present

## 2019-01-26 DIAGNOSIS — G2581 Restless legs syndrome: Secondary | ICD-10-CM | POA: Diagnosis not present

## 2019-01-26 DIAGNOSIS — E1151 Type 2 diabetes mellitus with diabetic peripheral angiopathy without gangrene: Secondary | ICD-10-CM | POA: Diagnosis not present

## 2019-01-26 DIAGNOSIS — F4323 Adjustment disorder with mixed anxiety and depressed mood: Secondary | ICD-10-CM | POA: Diagnosis not present

## 2019-01-26 DIAGNOSIS — Z89512 Acquired absence of left leg below knee: Secondary | ICD-10-CM | POA: Diagnosis not present

## 2019-01-26 DIAGNOSIS — E1140 Type 2 diabetes mellitus with diabetic neuropathy, unspecified: Secondary | ICD-10-CM | POA: Diagnosis not present

## 2019-01-26 DIAGNOSIS — F319 Bipolar disorder, unspecified: Secondary | ICD-10-CM | POA: Diagnosis not present

## 2019-01-26 DIAGNOSIS — E785 Hyperlipidemia, unspecified: Secondary | ICD-10-CM | POA: Diagnosis not present

## 2019-01-26 DIAGNOSIS — Z87891 Personal history of nicotine dependence: Secondary | ICD-10-CM | POA: Diagnosis not present

## 2019-01-26 DIAGNOSIS — I10 Essential (primary) hypertension: Secondary | ICD-10-CM | POA: Diagnosis not present

## 2019-01-26 DIAGNOSIS — Z993 Dependence on wheelchair: Secondary | ICD-10-CM | POA: Diagnosis not present

## 2019-01-26 DIAGNOSIS — Z7982 Long term (current) use of aspirin: Secondary | ICD-10-CM | POA: Diagnosis not present

## 2019-01-26 DIAGNOSIS — J449 Chronic obstructive pulmonary disease, unspecified: Secondary | ICD-10-CM | POA: Diagnosis not present

## 2019-01-26 DIAGNOSIS — G4733 Obstructive sleep apnea (adult) (pediatric): Secondary | ICD-10-CM | POA: Diagnosis not present

## 2019-01-26 DIAGNOSIS — Z7984 Long term (current) use of oral hypoglycemic drugs: Secondary | ICD-10-CM | POA: Diagnosis not present

## 2019-01-26 DIAGNOSIS — E559 Vitamin D deficiency, unspecified: Secondary | ICD-10-CM | POA: Diagnosis not present

## 2019-01-26 DIAGNOSIS — K219 Gastro-esophageal reflux disease without esophagitis: Secondary | ICD-10-CM | POA: Diagnosis not present

## 2019-01-26 DIAGNOSIS — Z48812 Encounter for surgical aftercare following surgery on the circulatory system: Secondary | ICD-10-CM | POA: Diagnosis not present

## 2019-01-26 DIAGNOSIS — Z7951 Long term (current) use of inhaled steroids: Secondary | ICD-10-CM | POA: Diagnosis not present

## 2019-01-26 DIAGNOSIS — E11319 Type 2 diabetes mellitus with unspecified diabetic retinopathy without macular edema: Secondary | ICD-10-CM | POA: Diagnosis not present

## 2019-01-26 DIAGNOSIS — Z4781 Encounter for orthopedic aftercare following surgical amputation: Secondary | ICD-10-CM | POA: Diagnosis not present

## 2019-01-26 DIAGNOSIS — Z89431 Acquired absence of right foot: Secondary | ICD-10-CM | POA: Diagnosis not present

## 2019-01-26 DIAGNOSIS — E1142 Type 2 diabetes mellitus with diabetic polyneuropathy: Secondary | ICD-10-CM | POA: Diagnosis not present

## 2019-01-26 DIAGNOSIS — Z79891 Long term (current) use of opiate analgesic: Secondary | ICD-10-CM | POA: Diagnosis not present

## 2019-01-27 DIAGNOSIS — E1140 Type 2 diabetes mellitus with diabetic neuropathy, unspecified: Secondary | ICD-10-CM | POA: Diagnosis not present

## 2019-01-28 DIAGNOSIS — E1140 Type 2 diabetes mellitus with diabetic neuropathy, unspecified: Secondary | ICD-10-CM | POA: Diagnosis not present

## 2019-01-29 DIAGNOSIS — E1140 Type 2 diabetes mellitus with diabetic neuropathy, unspecified: Secondary | ICD-10-CM | POA: Diagnosis not present

## 2019-01-30 DIAGNOSIS — E1140 Type 2 diabetes mellitus with diabetic neuropathy, unspecified: Secondary | ICD-10-CM | POA: Diagnosis not present

## 2019-01-31 ENCOUNTER — Telehealth: Payer: Self-pay | Admitting: Family Medicine

## 2019-01-31 DIAGNOSIS — E1140 Type 2 diabetes mellitus with diabetic neuropathy, unspecified: Secondary | ICD-10-CM | POA: Diagnosis not present

## 2019-01-31 NOTE — Telephone Encounter (Signed)
Pt is calling and would like a referral to Endocrinology placed. jw

## 2019-02-01 ENCOUNTER — Other Ambulatory Visit: Payer: Self-pay | Admitting: Family Medicine

## 2019-02-01 DIAGNOSIS — F319 Bipolar disorder, unspecified: Secondary | ICD-10-CM | POA: Diagnosis not present

## 2019-02-01 DIAGNOSIS — Z4781 Encounter for orthopedic aftercare following surgical amputation: Secondary | ICD-10-CM | POA: Diagnosis not present

## 2019-02-01 DIAGNOSIS — E11319 Type 2 diabetes mellitus with unspecified diabetic retinopathy without macular edema: Secondary | ICD-10-CM | POA: Diagnosis not present

## 2019-02-01 DIAGNOSIS — G4733 Obstructive sleep apnea (adult) (pediatric): Secondary | ICD-10-CM | POA: Diagnosis not present

## 2019-02-01 DIAGNOSIS — K219 Gastro-esophageal reflux disease without esophagitis: Secondary | ICD-10-CM | POA: Diagnosis not present

## 2019-02-01 DIAGNOSIS — Z7951 Long term (current) use of inhaled steroids: Secondary | ICD-10-CM | POA: Diagnosis not present

## 2019-02-01 DIAGNOSIS — IMO0002 Reserved for concepts with insufficient information to code with codable children: Secondary | ICD-10-CM

## 2019-02-01 DIAGNOSIS — E1151 Type 2 diabetes mellitus with diabetic peripheral angiopathy without gangrene: Secondary | ICD-10-CM | POA: Diagnosis not present

## 2019-02-01 DIAGNOSIS — Z89512 Acquired absence of left leg below knee: Secondary | ICD-10-CM | POA: Diagnosis not present

## 2019-02-01 DIAGNOSIS — E559 Vitamin D deficiency, unspecified: Secondary | ICD-10-CM | POA: Diagnosis not present

## 2019-02-01 DIAGNOSIS — J449 Chronic obstructive pulmonary disease, unspecified: Secondary | ICD-10-CM | POA: Diagnosis not present

## 2019-02-01 DIAGNOSIS — F4323 Adjustment disorder with mixed anxiety and depressed mood: Secondary | ICD-10-CM | POA: Diagnosis not present

## 2019-02-01 DIAGNOSIS — Z993 Dependence on wheelchair: Secondary | ICD-10-CM | POA: Diagnosis not present

## 2019-02-01 DIAGNOSIS — Z48812 Encounter for surgical aftercare following surgery on the circulatory system: Secondary | ICD-10-CM | POA: Diagnosis not present

## 2019-02-01 DIAGNOSIS — I10 Essential (primary) hypertension: Secondary | ICD-10-CM | POA: Diagnosis not present

## 2019-02-01 DIAGNOSIS — E1165 Type 2 diabetes mellitus with hyperglycemia: Principal | ICD-10-CM

## 2019-02-01 DIAGNOSIS — Z89431 Acquired absence of right foot: Secondary | ICD-10-CM | POA: Diagnosis not present

## 2019-02-01 DIAGNOSIS — Z7984 Long term (current) use of oral hypoglycemic drugs: Secondary | ICD-10-CM | POA: Diagnosis not present

## 2019-02-01 DIAGNOSIS — G2581 Restless legs syndrome: Secondary | ICD-10-CM | POA: Diagnosis not present

## 2019-02-01 DIAGNOSIS — E1142 Type 2 diabetes mellitus with diabetic polyneuropathy: Secondary | ICD-10-CM | POA: Diagnosis not present

## 2019-02-01 DIAGNOSIS — E1140 Type 2 diabetes mellitus with diabetic neuropathy, unspecified: Secondary | ICD-10-CM | POA: Diagnosis not present

## 2019-02-01 DIAGNOSIS — Z87891 Personal history of nicotine dependence: Secondary | ICD-10-CM | POA: Diagnosis not present

## 2019-02-01 DIAGNOSIS — Z79891 Long term (current) use of opiate analgesic: Secondary | ICD-10-CM | POA: Diagnosis not present

## 2019-02-01 DIAGNOSIS — M1711 Unilateral primary osteoarthritis, right knee: Secondary | ICD-10-CM | POA: Diagnosis not present

## 2019-02-01 DIAGNOSIS — Z7982 Long term (current) use of aspirin: Secondary | ICD-10-CM | POA: Diagnosis not present

## 2019-02-01 DIAGNOSIS — E785 Hyperlipidemia, unspecified: Secondary | ICD-10-CM | POA: Diagnosis not present

## 2019-02-01 NOTE — Telephone Encounter (Signed)
Referral entered Belinda Belfield J Delance Weide, MD  

## 2019-02-02 DIAGNOSIS — E1140 Type 2 diabetes mellitus with diabetic neuropathy, unspecified: Secondary | ICD-10-CM | POA: Diagnosis not present

## 2019-02-03 DIAGNOSIS — E1140 Type 2 diabetes mellitus with diabetic neuropathy, unspecified: Secondary | ICD-10-CM | POA: Diagnosis not present

## 2019-02-04 DIAGNOSIS — E1140 Type 2 diabetes mellitus with diabetic neuropathy, unspecified: Secondary | ICD-10-CM | POA: Diagnosis not present

## 2019-02-05 DIAGNOSIS — E1140 Type 2 diabetes mellitus with diabetic neuropathy, unspecified: Secondary | ICD-10-CM | POA: Diagnosis not present

## 2019-02-06 DIAGNOSIS — E1140 Type 2 diabetes mellitus with diabetic neuropathy, unspecified: Secondary | ICD-10-CM | POA: Diagnosis not present

## 2019-02-07 DIAGNOSIS — E1140 Type 2 diabetes mellitus with diabetic neuropathy, unspecified: Secondary | ICD-10-CM | POA: Diagnosis not present

## 2019-02-08 DIAGNOSIS — G2581 Restless legs syndrome: Secondary | ICD-10-CM | POA: Diagnosis not present

## 2019-02-08 DIAGNOSIS — Z7951 Long term (current) use of inhaled steroids: Secondary | ICD-10-CM | POA: Diagnosis not present

## 2019-02-08 DIAGNOSIS — Z7982 Long term (current) use of aspirin: Secondary | ICD-10-CM | POA: Diagnosis not present

## 2019-02-08 DIAGNOSIS — K219 Gastro-esophageal reflux disease without esophagitis: Secondary | ICD-10-CM | POA: Diagnosis not present

## 2019-02-08 DIAGNOSIS — F4323 Adjustment disorder with mixed anxiety and depressed mood: Secondary | ICD-10-CM | POA: Diagnosis not present

## 2019-02-08 DIAGNOSIS — E1140 Type 2 diabetes mellitus with diabetic neuropathy, unspecified: Secondary | ICD-10-CM | POA: Diagnosis not present

## 2019-02-08 DIAGNOSIS — Z4781 Encounter for orthopedic aftercare following surgical amputation: Secondary | ICD-10-CM | POA: Diagnosis not present

## 2019-02-08 DIAGNOSIS — G4733 Obstructive sleep apnea (adult) (pediatric): Secondary | ICD-10-CM | POA: Diagnosis not present

## 2019-02-08 DIAGNOSIS — Z48812 Encounter for surgical aftercare following surgery on the circulatory system: Secondary | ICD-10-CM | POA: Diagnosis not present

## 2019-02-08 DIAGNOSIS — Z993 Dependence on wheelchair: Secondary | ICD-10-CM | POA: Diagnosis not present

## 2019-02-08 DIAGNOSIS — E1142 Type 2 diabetes mellitus with diabetic polyneuropathy: Secondary | ICD-10-CM | POA: Diagnosis not present

## 2019-02-08 DIAGNOSIS — M1711 Unilateral primary osteoarthritis, right knee: Secondary | ICD-10-CM | POA: Diagnosis not present

## 2019-02-08 DIAGNOSIS — Z7984 Long term (current) use of oral hypoglycemic drugs: Secondary | ICD-10-CM | POA: Diagnosis not present

## 2019-02-08 DIAGNOSIS — J449 Chronic obstructive pulmonary disease, unspecified: Secondary | ICD-10-CM | POA: Diagnosis not present

## 2019-02-08 DIAGNOSIS — Z89431 Acquired absence of right foot: Secondary | ICD-10-CM | POA: Diagnosis not present

## 2019-02-08 DIAGNOSIS — Z87891 Personal history of nicotine dependence: Secondary | ICD-10-CM | POA: Diagnosis not present

## 2019-02-08 DIAGNOSIS — Z89512 Acquired absence of left leg below knee: Secondary | ICD-10-CM | POA: Diagnosis not present

## 2019-02-08 DIAGNOSIS — E559 Vitamin D deficiency, unspecified: Secondary | ICD-10-CM | POA: Diagnosis not present

## 2019-02-08 DIAGNOSIS — I10 Essential (primary) hypertension: Secondary | ICD-10-CM | POA: Diagnosis not present

## 2019-02-08 DIAGNOSIS — E1151 Type 2 diabetes mellitus with diabetic peripheral angiopathy without gangrene: Secondary | ICD-10-CM | POA: Diagnosis not present

## 2019-02-08 DIAGNOSIS — F319 Bipolar disorder, unspecified: Secondary | ICD-10-CM | POA: Diagnosis not present

## 2019-02-08 DIAGNOSIS — Z79891 Long term (current) use of opiate analgesic: Secondary | ICD-10-CM | POA: Diagnosis not present

## 2019-02-08 DIAGNOSIS — E11319 Type 2 diabetes mellitus with unspecified diabetic retinopathy without macular edema: Secondary | ICD-10-CM | POA: Diagnosis not present

## 2019-02-08 DIAGNOSIS — E785 Hyperlipidemia, unspecified: Secondary | ICD-10-CM | POA: Diagnosis not present

## 2019-02-09 DIAGNOSIS — E1140 Type 2 diabetes mellitus with diabetic neuropathy, unspecified: Secondary | ICD-10-CM | POA: Diagnosis not present

## 2019-02-10 DIAGNOSIS — E1140 Type 2 diabetes mellitus with diabetic neuropathy, unspecified: Secondary | ICD-10-CM | POA: Diagnosis not present

## 2019-02-11 DIAGNOSIS — E1140 Type 2 diabetes mellitus with diabetic neuropathy, unspecified: Secondary | ICD-10-CM | POA: Diagnosis not present

## 2019-02-12 DIAGNOSIS — E1140 Type 2 diabetes mellitus with diabetic neuropathy, unspecified: Secondary | ICD-10-CM | POA: Diagnosis not present

## 2019-02-13 ENCOUNTER — Ambulatory Visit: Payer: Medicaid Other | Admitting: Physical Therapy

## 2019-02-13 DIAGNOSIS — E1140 Type 2 diabetes mellitus with diabetic neuropathy, unspecified: Secondary | ICD-10-CM | POA: Diagnosis not present

## 2019-02-14 DIAGNOSIS — E1140 Type 2 diabetes mellitus with diabetic neuropathy, unspecified: Secondary | ICD-10-CM | POA: Diagnosis not present

## 2019-02-15 DIAGNOSIS — E1140 Type 2 diabetes mellitus with diabetic neuropathy, unspecified: Secondary | ICD-10-CM | POA: Diagnosis not present

## 2019-02-16 DIAGNOSIS — E1140 Type 2 diabetes mellitus with diabetic neuropathy, unspecified: Secondary | ICD-10-CM | POA: Diagnosis not present

## 2019-02-17 DIAGNOSIS — E1140 Type 2 diabetes mellitus with diabetic neuropathy, unspecified: Secondary | ICD-10-CM | POA: Diagnosis not present

## 2019-02-18 DIAGNOSIS — E1140 Type 2 diabetes mellitus with diabetic neuropathy, unspecified: Secondary | ICD-10-CM | POA: Diagnosis not present

## 2019-02-19 DIAGNOSIS — E1140 Type 2 diabetes mellitus with diabetic neuropathy, unspecified: Secondary | ICD-10-CM | POA: Diagnosis not present

## 2019-02-20 DIAGNOSIS — E1140 Type 2 diabetes mellitus with diabetic neuropathy, unspecified: Secondary | ICD-10-CM | POA: Diagnosis not present

## 2019-02-21 DIAGNOSIS — E1140 Type 2 diabetes mellitus with diabetic neuropathy, unspecified: Secondary | ICD-10-CM | POA: Diagnosis not present

## 2019-02-22 DIAGNOSIS — E1140 Type 2 diabetes mellitus with diabetic neuropathy, unspecified: Secondary | ICD-10-CM | POA: Diagnosis not present

## 2019-02-23 DIAGNOSIS — E1140 Type 2 diabetes mellitus with diabetic neuropathy, unspecified: Secondary | ICD-10-CM | POA: Diagnosis not present

## 2019-02-24 DIAGNOSIS — E1140 Type 2 diabetes mellitus with diabetic neuropathy, unspecified: Secondary | ICD-10-CM | POA: Diagnosis not present

## 2019-02-25 DIAGNOSIS — E1140 Type 2 diabetes mellitus with diabetic neuropathy, unspecified: Secondary | ICD-10-CM | POA: Diagnosis not present

## 2019-02-26 ENCOUNTER — Ambulatory Visit: Payer: Medicaid Other | Admitting: Physical Therapy

## 2019-02-26 DIAGNOSIS — E1140 Type 2 diabetes mellitus with diabetic neuropathy, unspecified: Secondary | ICD-10-CM | POA: Diagnosis not present

## 2019-02-27 DIAGNOSIS — E1140 Type 2 diabetes mellitus with diabetic neuropathy, unspecified: Secondary | ICD-10-CM | POA: Diagnosis not present

## 2019-02-28 DIAGNOSIS — E1140 Type 2 diabetes mellitus with diabetic neuropathy, unspecified: Secondary | ICD-10-CM | POA: Diagnosis not present

## 2019-03-01 DIAGNOSIS — E1140 Type 2 diabetes mellitus with diabetic neuropathy, unspecified: Secondary | ICD-10-CM | POA: Diagnosis not present

## 2019-03-02 DIAGNOSIS — E1140 Type 2 diabetes mellitus with diabetic neuropathy, unspecified: Secondary | ICD-10-CM | POA: Diagnosis not present

## 2019-03-03 DIAGNOSIS — E1140 Type 2 diabetes mellitus with diabetic neuropathy, unspecified: Secondary | ICD-10-CM | POA: Diagnosis not present

## 2019-03-04 DIAGNOSIS — E1140 Type 2 diabetes mellitus with diabetic neuropathy, unspecified: Secondary | ICD-10-CM | POA: Diagnosis not present

## 2019-03-05 DIAGNOSIS — E1140 Type 2 diabetes mellitus with diabetic neuropathy, unspecified: Secondary | ICD-10-CM | POA: Diagnosis not present

## 2019-03-06 DIAGNOSIS — E1140 Type 2 diabetes mellitus with diabetic neuropathy, unspecified: Secondary | ICD-10-CM | POA: Diagnosis not present

## 2019-03-07 DIAGNOSIS — E1140 Type 2 diabetes mellitus with diabetic neuropathy, unspecified: Secondary | ICD-10-CM | POA: Diagnosis not present

## 2019-03-07 IMAGING — CR DG FOOT COMPLETE 3+V*L*
3 series · 3 of 3 positions shown · non-contrast
Comparison: 09/26/2017

CLINICAL DATA: Ulcer and pain on the little digit. Evaluate for
osteomyelitis.

EXAM:
LEFT FOOT - COMPLETE 3+ VIEW

[foot ap]
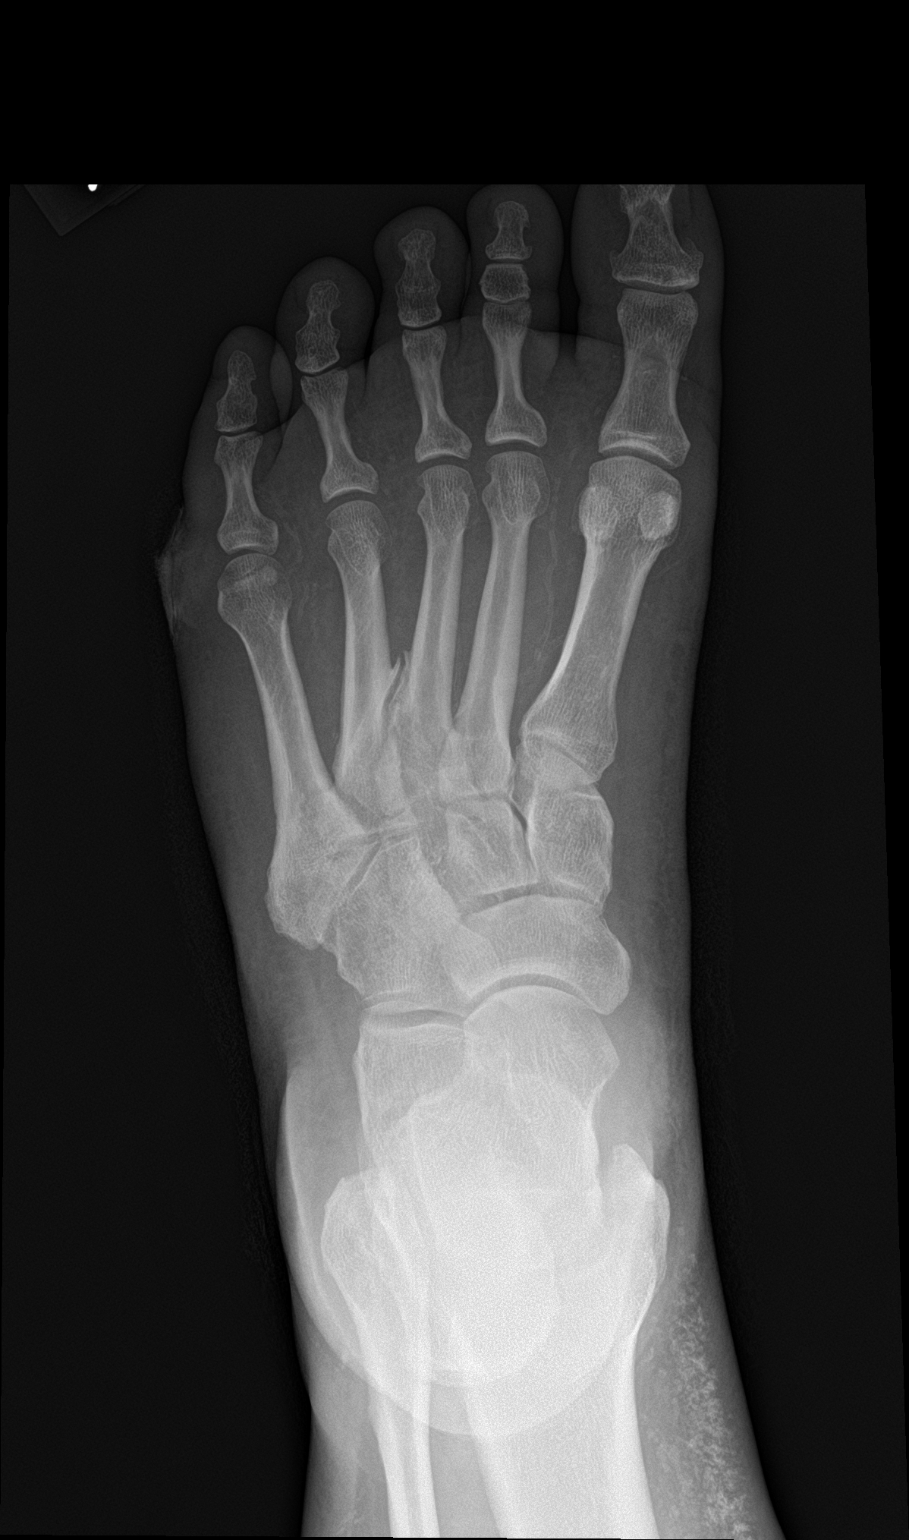

[foot obl]
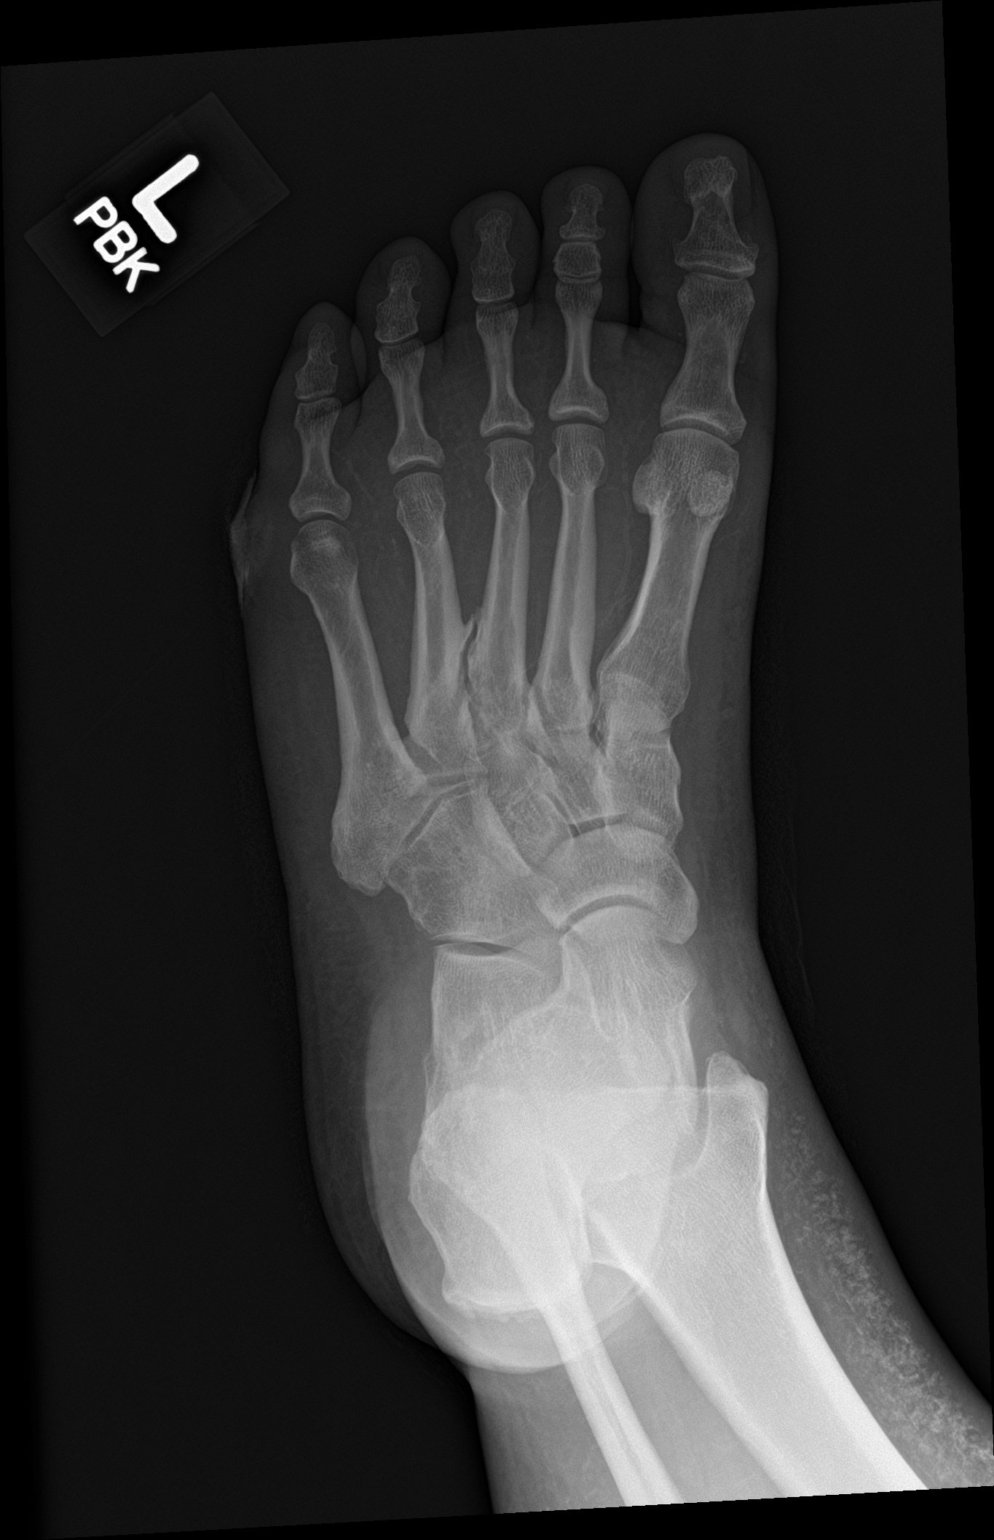

[foot lat]
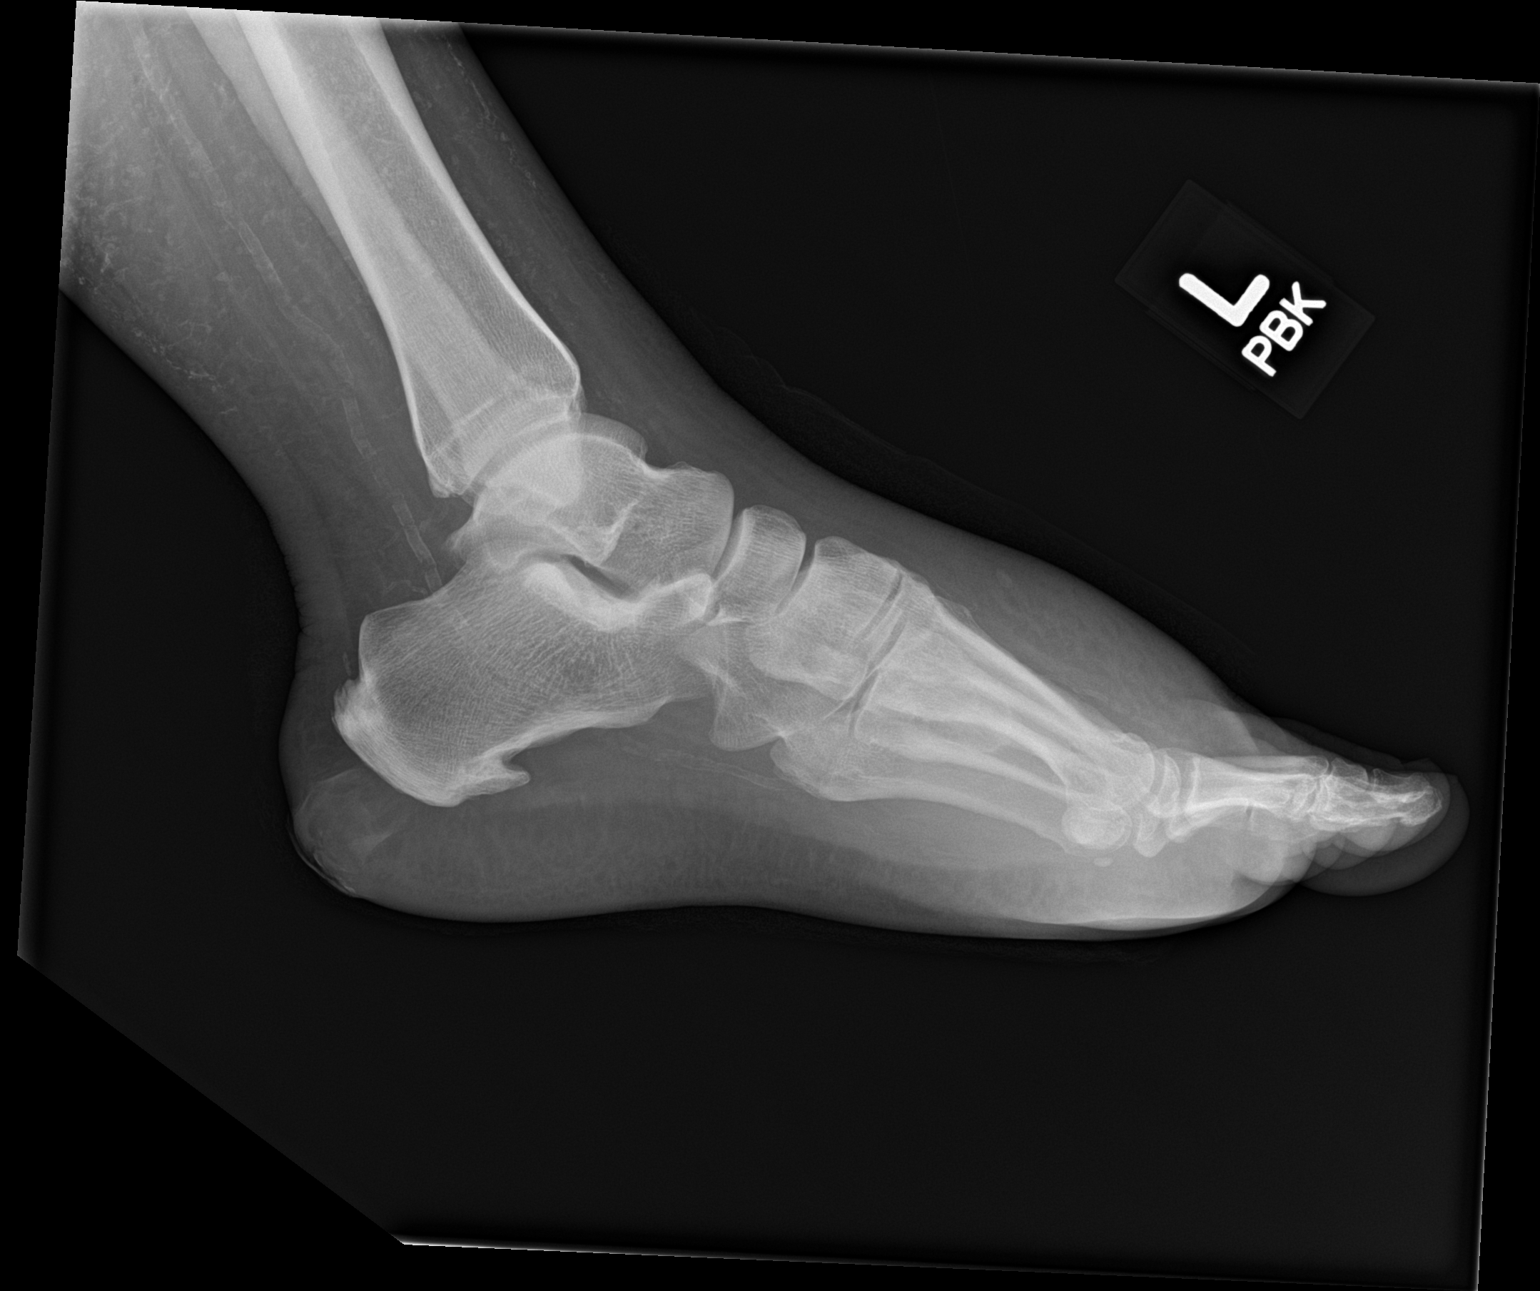

[3 of 3 positions shown; findings below may reference images not displayed]

FINDINGS: Soft tissue irregularity and bandage over the fifth metatarsal head.
No evidence of erosion, soft tissue gas, or opaque foreign body. No
fracture deformity.

Heel ulcer and bandage also noted.  No adjacent acute bony finding.

Extensive arterial calcification. Dystrophic soft tissue
calcifications in the lower leg.
IMPRESSION: No evidence of osteomyelitis. No opaque foreign body or soft tissue
gas.

## 2019-03-08 ENCOUNTER — Ambulatory Visit: Payer: Medicaid Other

## 2019-03-08 DIAGNOSIS — E1140 Type 2 diabetes mellitus with diabetic neuropathy, unspecified: Secondary | ICD-10-CM | POA: Diagnosis not present

## 2019-03-08 IMAGING — MR MR FOOT*L* W/O CM
4 of 7 series · 25 of 40 positions shown · non-contrast
Comparison: None.

CLINICAL DATA: Worsening ulcer along the lateral base of the fifth
MTP joint.

EXAM:
MRI OF THE LEFT FOOT WITHOUT CONTRAST
TECHNIQUE: Multiplanar, multisequence MR imaging of the left forefoot was
performed. No intravenous contrast was administered.

[Series 4: T1 · coronal · 3.0mm · 0.27mm/px · 8 of 38 slices shown (1 of 2)]
[im 1/38]
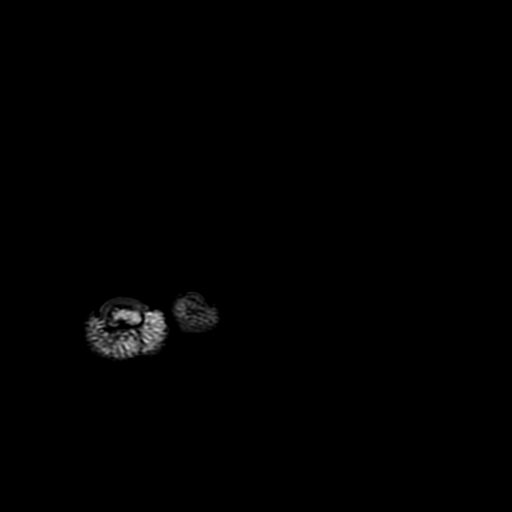
[im 6/38]
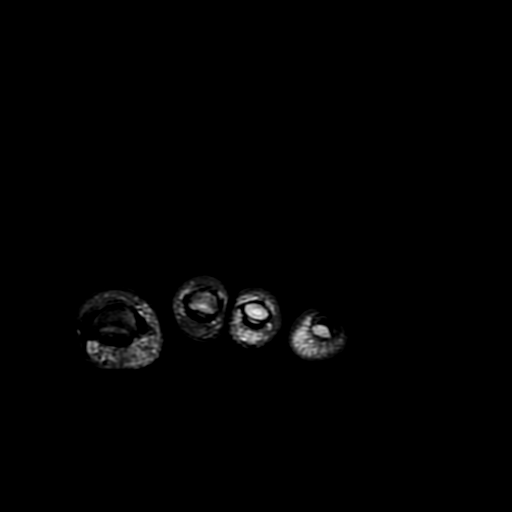
[im 11/38]
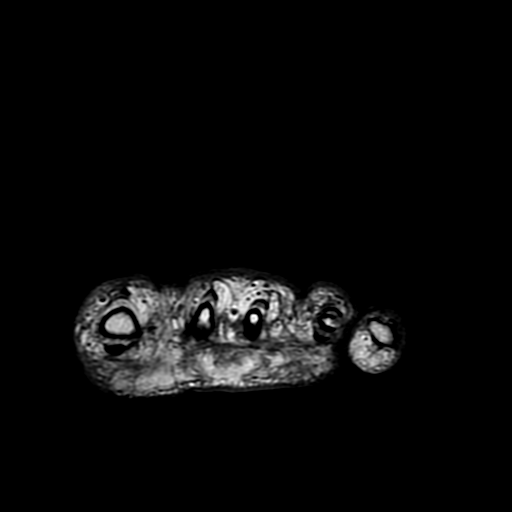
[im 16/38]
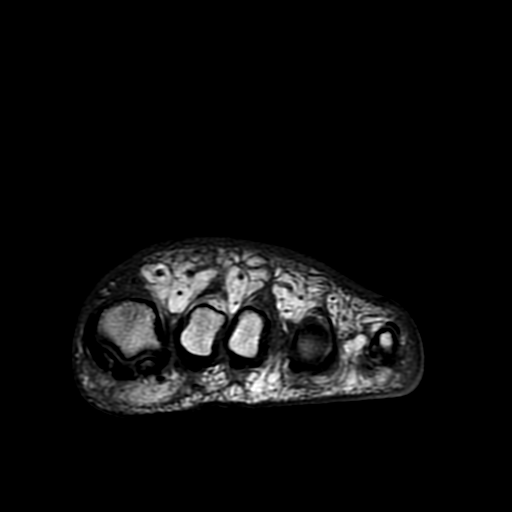
[im 22/38]
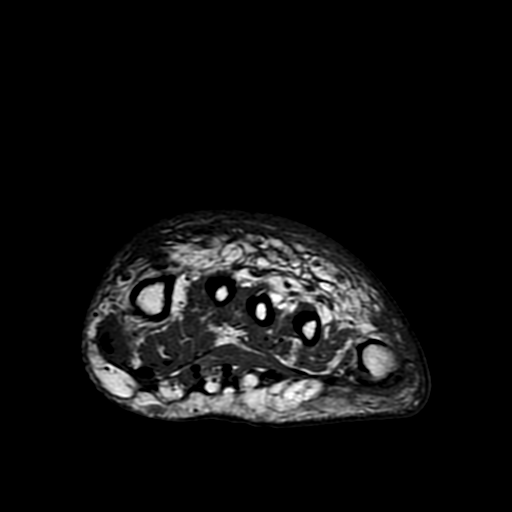
[im 27/38]
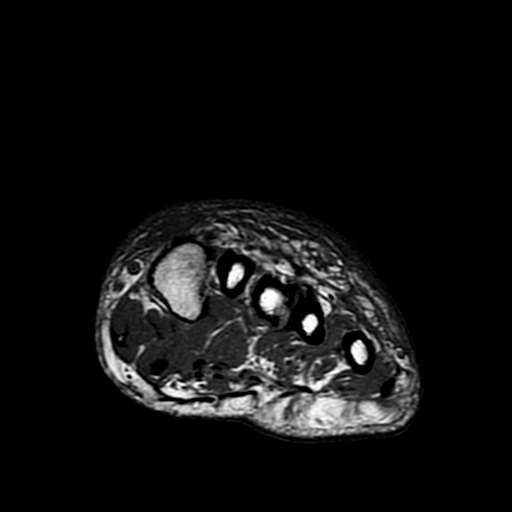
[im 32/38]
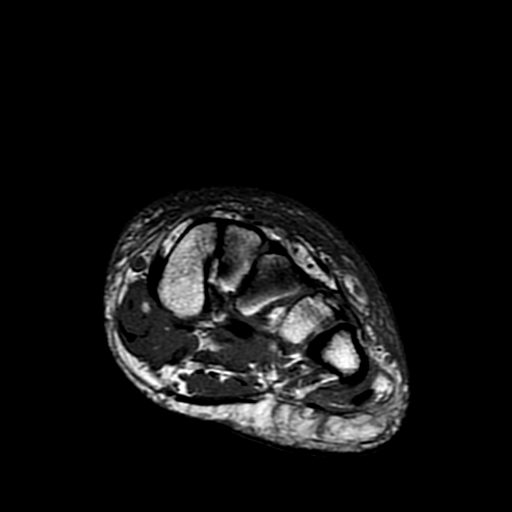
[im 38/38]
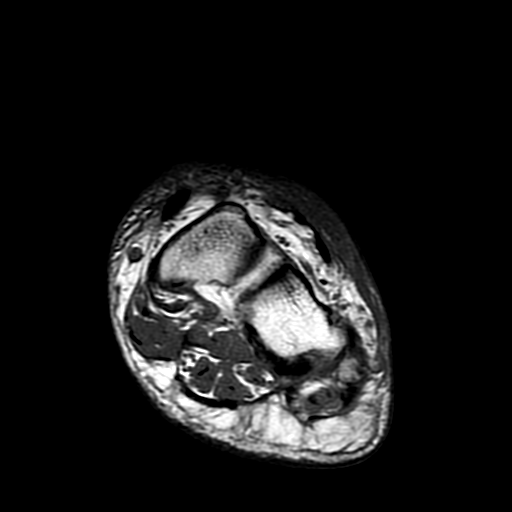

[Series 5: T1 fat-sat · coronal · non-contrast · 3.0mm · 0.27mm/px · 6 of 38 slices shown]
[im 1/38]
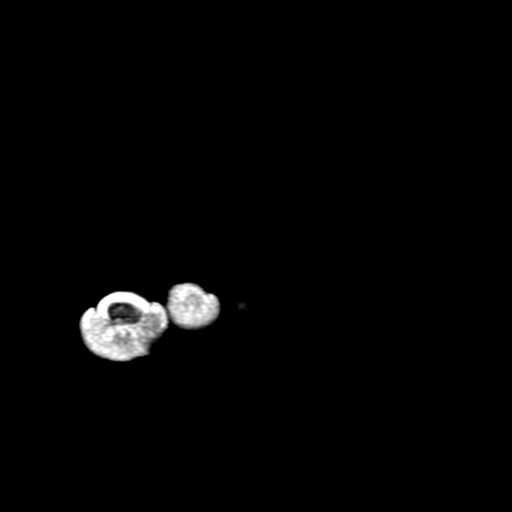
[im 6/38]
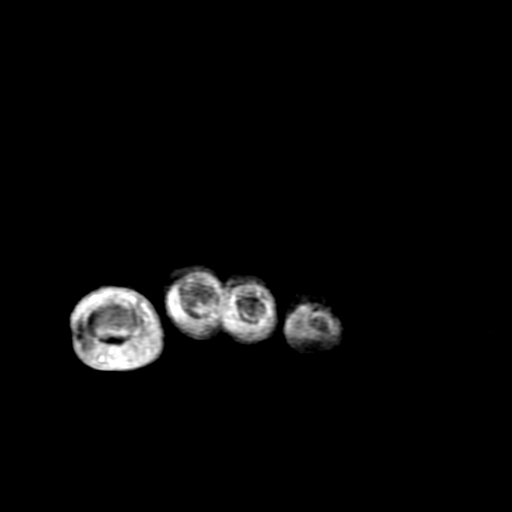
[im 11/38]
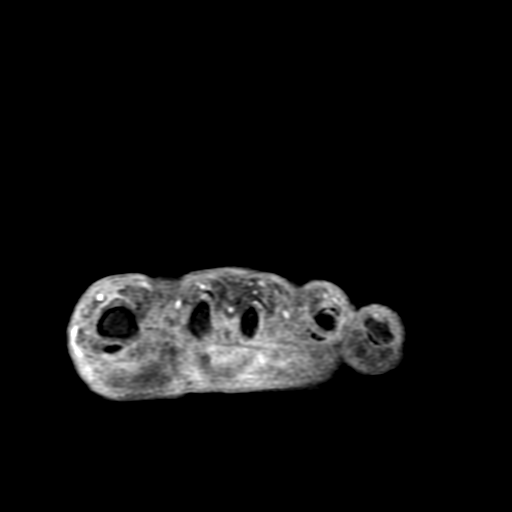
[im 16/38]
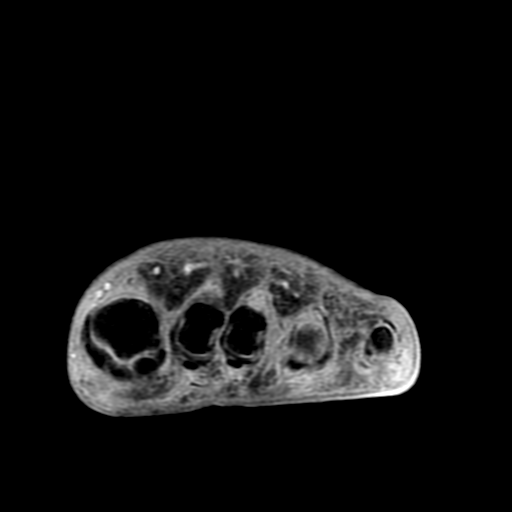
[im 22/38]
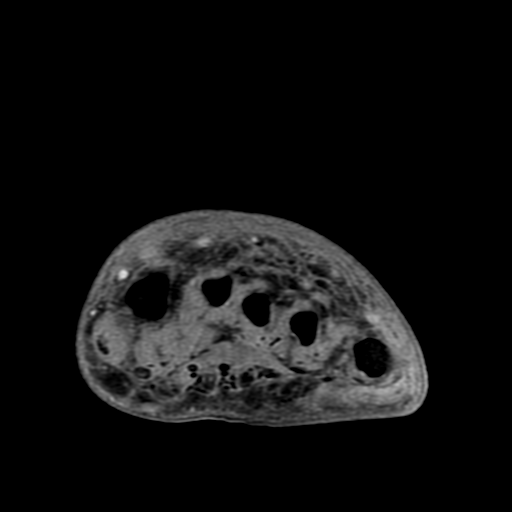
[im 32/38]
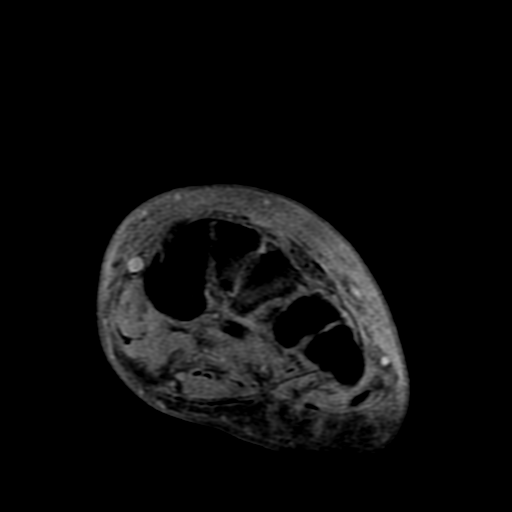

[Series 6: T2 fat-sat · coronal · 3.0mm · 0.55mm/px · 8 of 38 slices shown]
[im 1/38]
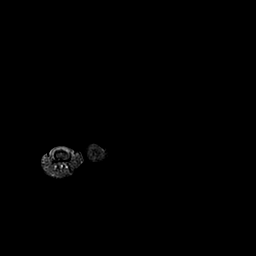
[im 6/38]
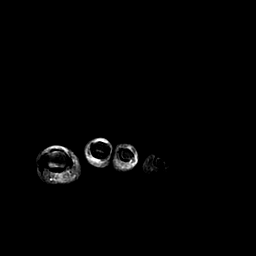
[im 11/38]
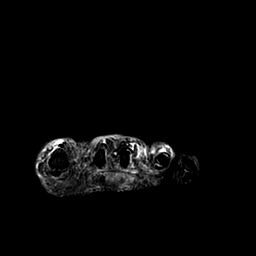
[im 16/38]
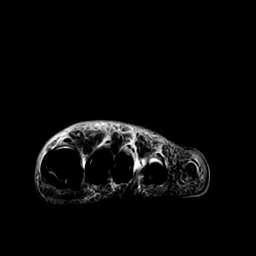
[im 22/38]
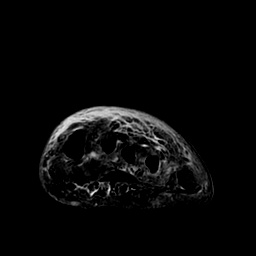
[im 27/38]
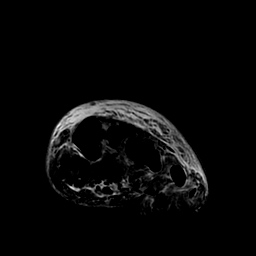
[im 32/38]
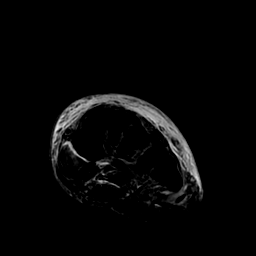
[im 38/38]
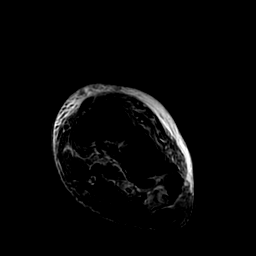

[Series 10: T1 · axial · 3.0mm · 0.31mm/px · z∈[-57,-2]mm · 3 of 20 slices shown (2 of 2)]
[im 1/20]
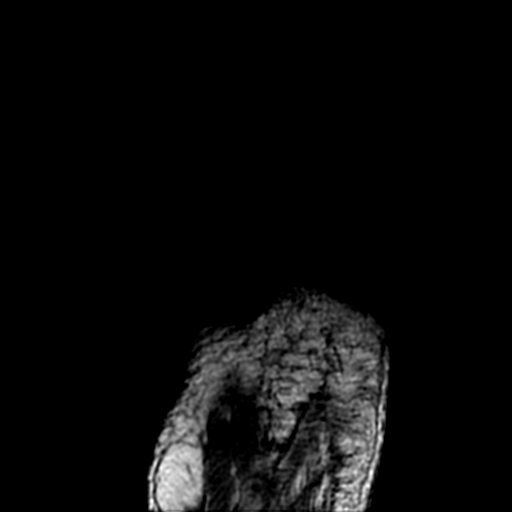
[im 13/20]
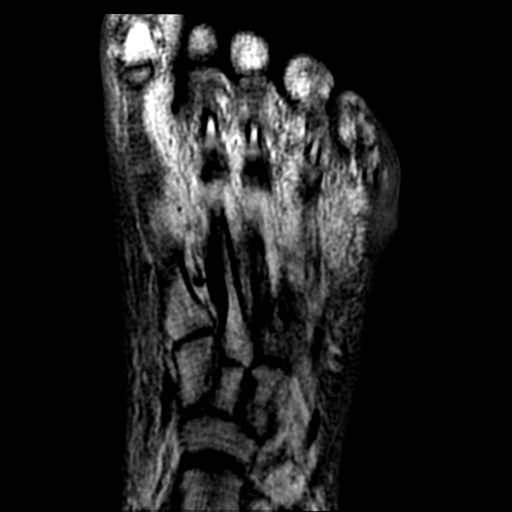
[im 20/20]
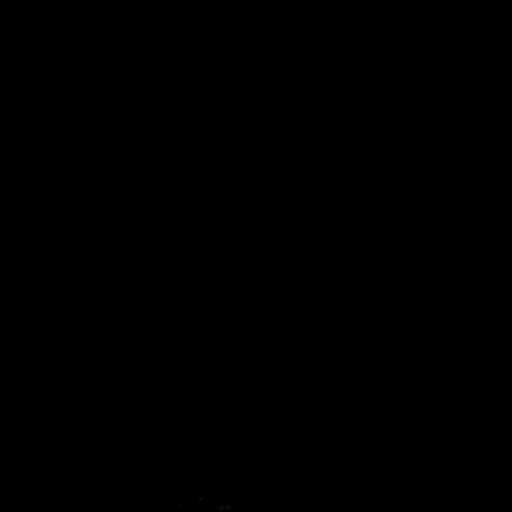

[25 of 40 positions shown; findings below may reference images not displayed]

FINDINGS: Bones/Joint/Cartilage

Soft tissue ulcer along the lateral aspect of the fifth metatarsal
head. Mild cortical irregularity and bone marrow edema in the fifth
metatarsal head.

No other marrow signal abnormality. No fracture or dislocation.
Normal alignment. No joint effusion. Mild osteoarthritis of third,
fourth and fifth tarsometatarsal joints.

Ligaments

Collateral ligaments are intact.  Lisfranc joint is intact.

Muscles and Tendons
Flexor, peroneal and extensor compartment tendons are intact.
Muscles are normal.

Soft tissue
No fluid collection or hematoma. No soft tissue mass. Soft tissue
edema along the dorsal aspect of the foot and more severe around the
fifth MTP joint.
IMPRESSION: Soft tissue ulcer along the lateral aspect of the fifth metatarsal
head. Mild cortical irregularity and bone marrow edema in the fifth
metatarsal head concerning for osteomyelitis.

## 2019-03-09 DIAGNOSIS — E1140 Type 2 diabetes mellitus with diabetic neuropathy, unspecified: Secondary | ICD-10-CM | POA: Diagnosis not present

## 2019-03-10 DIAGNOSIS — E1140 Type 2 diabetes mellitus with diabetic neuropathy, unspecified: Secondary | ICD-10-CM | POA: Diagnosis not present

## 2019-03-11 DIAGNOSIS — E1140 Type 2 diabetes mellitus with diabetic neuropathy, unspecified: Secondary | ICD-10-CM | POA: Diagnosis not present

## 2019-03-12 DIAGNOSIS — E1140 Type 2 diabetes mellitus with diabetic neuropathy, unspecified: Secondary | ICD-10-CM | POA: Diagnosis not present

## 2019-03-13 DIAGNOSIS — E1140 Type 2 diabetes mellitus with diabetic neuropathy, unspecified: Secondary | ICD-10-CM | POA: Diagnosis not present

## 2019-03-14 DIAGNOSIS — E1140 Type 2 diabetes mellitus with diabetic neuropathy, unspecified: Secondary | ICD-10-CM | POA: Diagnosis not present

## 2019-03-15 DIAGNOSIS — E1140 Type 2 diabetes mellitus with diabetic neuropathy, unspecified: Secondary | ICD-10-CM | POA: Diagnosis not present

## 2019-03-16 DIAGNOSIS — E1140 Type 2 diabetes mellitus with diabetic neuropathy, unspecified: Secondary | ICD-10-CM | POA: Diagnosis not present

## 2019-03-17 DIAGNOSIS — E1140 Type 2 diabetes mellitus with diabetic neuropathy, unspecified: Secondary | ICD-10-CM | POA: Diagnosis not present

## 2019-03-18 DIAGNOSIS — E1140 Type 2 diabetes mellitus with diabetic neuropathy, unspecified: Secondary | ICD-10-CM | POA: Diagnosis not present

## 2019-03-19 DIAGNOSIS — E1140 Type 2 diabetes mellitus with diabetic neuropathy, unspecified: Secondary | ICD-10-CM | POA: Diagnosis not present

## 2019-03-20 DIAGNOSIS — E1140 Type 2 diabetes mellitus with diabetic neuropathy, unspecified: Secondary | ICD-10-CM | POA: Diagnosis not present

## 2019-03-21 ENCOUNTER — Encounter: Payer: Self-pay | Admitting: Endocrinology

## 2019-03-21 ENCOUNTER — Encounter: Payer: Self-pay | Admitting: Internal Medicine

## 2019-03-21 DIAGNOSIS — E1140 Type 2 diabetes mellitus with diabetic neuropathy, unspecified: Secondary | ICD-10-CM | POA: Diagnosis not present

## 2019-03-22 ENCOUNTER — Ambulatory Visit: Payer: Medicaid Other | Attending: Family Medicine | Admitting: Physical Therapy

## 2019-03-22 DIAGNOSIS — R2681 Unsteadiness on feet: Secondary | ICD-10-CM | POA: Insufficient documentation

## 2019-03-22 DIAGNOSIS — M6281 Muscle weakness (generalized): Secondary | ICD-10-CM | POA: Insufficient documentation

## 2019-03-22 DIAGNOSIS — M21371 Foot drop, right foot: Secondary | ICD-10-CM | POA: Insufficient documentation

## 2019-03-22 DIAGNOSIS — M25652 Stiffness of left hip, not elsewhere classified: Secondary | ICD-10-CM | POA: Insufficient documentation

## 2019-03-22 DIAGNOSIS — R2689 Other abnormalities of gait and mobility: Secondary | ICD-10-CM | POA: Insufficient documentation

## 2019-03-22 DIAGNOSIS — E1140 Type 2 diabetes mellitus with diabetic neuropathy, unspecified: Secondary | ICD-10-CM | POA: Diagnosis not present

## 2019-03-22 DIAGNOSIS — R293 Abnormal posture: Secondary | ICD-10-CM | POA: Insufficient documentation

## 2019-03-22 DIAGNOSIS — M25651 Stiffness of right hip, not elsewhere classified: Secondary | ICD-10-CM | POA: Insufficient documentation

## 2019-03-23 DIAGNOSIS — E1140 Type 2 diabetes mellitus with diabetic neuropathy, unspecified: Secondary | ICD-10-CM | POA: Diagnosis not present

## 2019-03-24 DIAGNOSIS — E1140 Type 2 diabetes mellitus with diabetic neuropathy, unspecified: Secondary | ICD-10-CM | POA: Diagnosis not present

## 2019-03-25 DIAGNOSIS — E1140 Type 2 diabetes mellitus with diabetic neuropathy, unspecified: Secondary | ICD-10-CM | POA: Diagnosis not present

## 2019-03-26 DIAGNOSIS — E1140 Type 2 diabetes mellitus with diabetic neuropathy, unspecified: Secondary | ICD-10-CM | POA: Diagnosis not present

## 2019-03-27 ENCOUNTER — Encounter (HOSPITAL_COMMUNITY): Payer: Self-pay

## 2019-03-27 ENCOUNTER — Emergency Department (HOSPITAL_COMMUNITY): Payer: Medicaid Other

## 2019-03-27 ENCOUNTER — Emergency Department (HOSPITAL_COMMUNITY)
Admission: EM | Admit: 2019-03-27 | Discharge: 2019-03-27 | Disposition: A | Discharge status: Home / Self Care | Payer: Medicaid Other | Attending: Emergency Medicine | Admitting: Emergency Medicine

## 2019-03-27 ENCOUNTER — Other Ambulatory Visit: Payer: Self-pay

## 2019-03-27 DIAGNOSIS — I10 Essential (primary) hypertension: Secondary | ICD-10-CM | POA: Insufficient documentation

## 2019-03-27 DIAGNOSIS — J449 Chronic obstructive pulmonary disease, unspecified: Secondary | ICD-10-CM | POA: Diagnosis not present

## 2019-03-27 DIAGNOSIS — R0789 Other chest pain: Secondary | ICD-10-CM | POA: Diagnosis not present

## 2019-03-27 DIAGNOSIS — R079 Chest pain, unspecified: Secondary | ICD-10-CM | POA: Diagnosis not present

## 2019-03-27 DIAGNOSIS — E1140 Type 2 diabetes mellitus with diabetic neuropathy, unspecified: Secondary | ICD-10-CM | POA: Diagnosis not present

## 2019-03-27 DIAGNOSIS — Z89512 Acquired absence of left leg below knee: Secondary | ICD-10-CM | POA: Insufficient documentation

## 2019-03-27 DIAGNOSIS — R0602 Shortness of breath: Secondary | ICD-10-CM | POA: Diagnosis not present

## 2019-03-27 DIAGNOSIS — E119 Type 2 diabetes mellitus without complications: Secondary | ICD-10-CM | POA: Diagnosis not present

## 2019-03-27 DIAGNOSIS — R062 Wheezing: Secondary | ICD-10-CM | POA: Diagnosis not present

## 2019-03-27 DIAGNOSIS — I959 Hypotension, unspecified: Secondary | ICD-10-CM | POA: Diagnosis not present

## 2019-03-27 DIAGNOSIS — Z794 Long term (current) use of insulin: Secondary | ICD-10-CM | POA: Diagnosis not present

## 2019-03-27 DIAGNOSIS — Z87891 Personal history of nicotine dependence: Secondary | ICD-10-CM | POA: Diagnosis not present

## 2019-03-27 DIAGNOSIS — F319 Bipolar disorder, unspecified: Secondary | ICD-10-CM | POA: Diagnosis not present

## 2019-03-27 DIAGNOSIS — Z79899 Other long term (current) drug therapy: Secondary | ICD-10-CM | POA: Diagnosis not present

## 2019-03-27 DIAGNOSIS — J40 Bronchitis, not specified as acute or chronic: Secondary | ICD-10-CM | POA: Diagnosis not present

## 2019-03-27 LAB — I-STAT TROPONIN, ED: Troponin i, poc: 0 ng/mL (ref 0.00–0.08)

## 2019-03-27 MED ORDER — ALBUTEROL SULFATE HFA 108 (90 BASE) MCG/ACT IN AERS
4.0000 | INHALATION_SPRAY | Freq: Once | RESPIRATORY_TRACT | Status: AC
  Administered 2019-03-27: 4 via RESPIRATORY_TRACT
  Filled 2019-03-27: qty 6.7

## 2019-03-27 MED ORDER — ALBUTEROL SULFATE HFA 108 (90 BASE) MCG/ACT IN AERS: 2.0000 | INHALATION_SPRAY | RESPIRATORY_TRACT | 0 refills | Status: DC | PRN

## 2019-03-27 MED ORDER — KETOROLAC TROMETHAMINE 30 MG/ML IJ SOLN
30.0000 mg | Freq: Once | INTRAMUSCULAR | Status: DC
  Filled 2019-03-27: qty 1

## 2019-03-27 MED ORDER — KETOROLAC TROMETHAMINE 30 MG/ML IJ SOLN
30.0000 mg | Freq: Once | INTRAMUSCULAR | Status: AC
  Administered 2019-03-27: 30 mg via INTRAVENOUS

## 2019-03-27 MED ORDER — DEXAMETHASONE SODIUM PHOSPHATE 10 MG/ML IJ SOLN
10.0000 mg | Freq: Once | INTRAMUSCULAR | Status: AC
  Administered 2019-03-27: 10 mg via INTRAVENOUS
  Filled 2019-03-27: qty 1

## 2019-03-27 NOTE — ED Triage Notes (Signed)
Per EMS, patient coming from home with complaints of shortness of breath, wheezing, and chest tightness for the past 45 minutes. Patient has a history of COPD.

## 2019-03-27 NOTE — ED Notes (Signed)
Bed: DE00 Expected date:  Expected time:  Means of arrival:  Comments: EMS 43 yo female wheezing and chest tightness

## 2019-03-27 NOTE — ED Provider Notes (Signed)
[facility] DEPT Provider Note   CSN: [id number] Arrival date & time: 03/27/19  0537    History   Chief Complaint Chief Complaint  Patient presents with   Shortness of Breath    HPI Gwyneth Fernandez is a 43 y.o. female.     HPI  This is a 43 year old female with a history of COPD, bipolar disorder, diabetes, hypertension, morbid obesity who presents with shortness of breath and chest pain.  Onset of symptoms at 4:30 AM.  Patient reports anterior nonradiating chest pain "like someone is hitting me in the chest."  She reports associated shortness of breath.  No recent fevers or cough or upper respiratory symptoms.  She has not taken anything for her symptoms.  Currently she rates her pain at 3 out of 10.  Denies any lower extremity swelling or history of blood clots.  She was given aspirin by EMS.  No known history of heart disease.  Past Medical History:  Diagnosis Date   Acute osteomyelitis of ankle or foot, left ([place]) 01/31/2018   Alveolar hypoventilation    Anemia    not on iron pill   Asthma    Bipolar 2 disorder ([name])    Carpal tunnel syndrome on right    recurrent   Cellulitis 08/2010-08/2011   Chronic pain    COPD (chronic obstructive pulmonary disease) ([name])    Symbicort daily and Proventil as needed   Costochondritis    Depression    Diabetes mellitus type II, uncontrolled ([place]) 2000   Type 2, Uncontrolled.Takes Lantus daily.Fasting blood sugar runs 150   Dizziness    occasionally   Drug-seeking behavior    GERD (gastroesophageal reflux disease)    takes Pantoprazole and Zantac daily   Headache    migraine-last one about a yr ago.Topamax daily   HLD (hyperlipidemia)    takes Atorvastatin daily   Hypertension    takes Lisinopril and Coreg daily   Morbid obesity ([name])    Muscle spasm    takes Flexeril as needed   Nocturia    OSA on CPAP    Peripheral neuropathy    takes Gabapentin daily   Pneumonia    "walking" several yrs ago and as a baby (12/05/2018)   Rectal fissure    Restless leg    Urinary frequency    Varicose veins    Right medial thigh and Left leg     Patient Active Problem List   Diagnosis Date Noted   Infection of right foot    Osteomyelitis ([place]) 12/05/2018   Cellulitis of great toe of right foot 10/29/2018   Osteomyelitis of great toe of right foot ([place]) 10/29/2018   Vulvovaginitis 10/23/2018   Encounter for orthopedic aftercare following surgical amputation 06/28/2018   Uncontrolled type 2 diabetes mellitus with insulin therapy ([place]) 04/16/2018   Amputation stump infection ([place]) 04/09/2018   Allergic rhinitis 03/06/2018   Unilateral complete BKA, left, sequela ([name])    Class 3 severe obesity due to excess calories with serious comorbidity and body mass index (BMI) of 50.0 to 59.9 in adult [facility])    Decreased pedal pulses 06/10/2017   Unilateral primary osteoarthritis, right knee 10/22/2016   Primary osteoarthritis of first carpometacarpal joint of left hand 07/30/2016   Diabetic neuropathy ([place]) 07/14/2016   Syncope 02/25/2016   De Quervain's tenosynovitis, bilateral 11/01/2015   Vitamin D deficiency 09/05/2015   Recurrent candidiasis of vagina 09/05/2015   Varicose veins of leg with complications 62/26/3335   Restless leg  syndrome 10/17/2014   Chronic sinusitis 07/18/2014   Headache 07/15/2014   Urge incontinence 10/15/2013   Encounter for chronic pain management 06/30/2013   HLD (hyperlipidemia) 11/19/2012   Chest pain 06/27/2012   Right carpal tunnel syndrome 09/01/2011   Bilateral knee pain 09/01/2011   DM (diabetes mellitus) type II uncontrolled, periph vascular disorder ([place]) 05/22/2008   Morbid obesity ([place]) 05/22/2008   OBESITY HYPOVENTILATION SYNDROME 05/22/2008   Depression 05/22/2008   Obstructive sleep apnea 05/22/2008   Hypertension 05/22/2008   Asthma 05/22/2008   GERD 05/22/2008    Past Surgical  History:  Procedure Laterality Date   AMPUTATION Left 02/01/2018   Procedure: LEFT FOURTH AND 5TH TOE RAY AMPUTATION;  Surgeon: [name], MD;  Location: [facility];  Service: Orthopedics;  Laterality: Left;   AMPUTATION Left 03/03/2018   Procedure: LEFT BELOW KNEE AMPUTATION;  Surgeon: [name], MD;  Location: [facility];  Service: Orthopedics;  Laterality: Left;   CARPAL TUNNEL RELEASE Bilateral    CESAREAN SECTION  2007   KNEE ARTHROSCOPY Right 07/17/2010   LEFT HEART CATHETERIZATION WITH CORONARY ANGIOGRAM N/A 07/27/2012   Procedure: LEFT HEART CATHETERIZATION WITH CORONARY ANGIOGRAM;  Surgeon: Sherren Mocha, MD;  Location: [facility] CATH LAB;  Service: Cardiovascular;  Laterality: N/A;   MASS EXCISION N/A 06/29/2013   Procedure:  WIDE LOCAL EXCISION OF POSTERIOR NECK ABSCESS;  Surgeon: Ralene Ok, MD;  Location: [facility];  Service: General;  Laterality: N/A;   REPAIR KNEE LIGAMENT Left    "fixed ligaments and chipped patella"     OB History    Gravida  2   Para  1   Term      Preterm      AB  1   Living  1     SAB  1   TAB      Ectopic      Multiple      Live Births               Home Medications    Prior to Admission medications   Medication Sig Start Date End Date Taking? Authorizing Provider  ACCU-CHEK SOFTCLIX LANCETS lancets Use as instructed to check blood sugar 3 times per day 03/16/18   Angiulli, Lavon Paganini, PA-C  albuterol (VENTOLIN HFA) 108 (90 Base) MCG/ACT inhaler Inhale 2 puffs into the lungs every 4 (four) hours as needed for wheezing or shortness of breath. 03/27/19   Timothy Trudell, Barbette Hair, MD  atorvastatin (LIPITOR) 20 MG tablet Take 1 tablet (20 mg total) by mouth daily. 03/16/18   Angiulli, Lavon Paganini, PA-C  Blood Glucose Monitoring Suppl (ACCU-CHEK NANO SMARTVIEW) w/Device KIT Use to check blood sugar 3 times daily 03/16/18   Angiulli, Lavon Paganini, PA-C  budesonide-formoterol (SYMBICORT) 160-4.5 MCG/ACT inhaler inhale 2 PUFFS into THE lungs 2 TIMES  DAILY Patient taking differently: Inhale 2 puffs into the lungs 2 (two) times daily.  10/10/18   [name], MD  cetirizine (ZYRTEC) 10 MG tablet Take 1 tablet (10 mg total) by mouth daily. Patient taking differently: Take 10 mg by mouth daily as needed for allergies.  02/28/18   [name], MD  Cholecalciferol 1000 units tablet Take 1 tablet (1,000 Units total) by mouth daily. 04/05/17   [name], MD  cyclobenzaprine (FLEXERIL) 5 MG tablet Take 1 tablet (5 mg total) by mouth 3 (three) times daily as needed for muscle spasms. 01/03/19   [name], MD  gabapentin (NEURONTIN) 600 MG tablet  Take 2 tablets (1,200 mg total) by mouth 3 (three) times daily. 01/23/19   [name], MD  gentamicin cream (GARAMYCIN) 0.1 % Apply 1 application topically 3 (three) times daily. Patient not taking: Reported on 12/01/2018 09/11/18   Edrick Kins, DPM  glucose blood (ACCU-CHEK GUIDE) test strip USE T0 CHECK BLOOD SUGAR 3 TIMES DAILY 03/16/18   Angiulli, Lavon Paganini, PA-C  HYDROcodone-acetaminophen (NORCO) 10-325 MG tablet Take 1 tablet by mouth every 8 (eight) hours as needed. 01/23/19   [name], MD  HYDROcodone-acetaminophen [facility]) 10-325 MG tablet Take 1 tablet by mouth every 8 (eight) hours as needed. 01/23/19   [name], MD  HYDROcodone-acetaminophen [facility]) 10-325 MG tablet Take 1 tablet by mouth every 8 (eight) hours as needed. 01/23/19   [name], MD  insulin aspart (NOVOLOG) 100 UNIT/ML FlexPen Inject 6 Units into the skin 3 (three) times daily with meals. Patient not taking: Reported on 12/01/2018 03/16/18   Angiulli, Lavon Paganini, PA-C  Insulin Isophane & Regular Human (HUMULIN 70/30 KWIKPEN) (70-30) 100 UNIT/ML PEN Inject 40-60 Units into the skin See admin instructions. Use 60 units every morning and use 40 units every evening 11/08/18   [provider]  metFORMIN (GLUCOPHAGE-XR) 500 MG 24 hr tablet Take 500 mg by mouth daily  with breakfast.  04/26/18 04/26/19  [provider]  nystatin cream (MYCOSTATIN) Apply 1 application topically 2 (two) times daily. 01/02/19   [name], MD  pantoprazole (PROTONIX) 40 MG tablet Take 1 tablet (40 mg total) by mouth daily. 03/16/18   Angiulli, Lavon Paganini, PA-C    Family History Family History  Problem Relation Age of Onset   Diabetes Mother    Hyperlipidemia Mother    Depression Mother    Varicose Veins Mother    Heart attack Paternal Uncle    Heart disease Paternal Grandmother    Heart attack Paternal Grandmother    Heart attack Paternal Grandfather    Heart disease Paternal Grandfather    Heart attack Father    Cancer Maternal Grandmother        COLON   Hypertension Maternal Grandmother    Hyperlipidemia Maternal Grandmother    Diabetes Maternal Grandmother    Other Maternal Grandfather        GUN SHOT    Social History Social History   Tobacco Use   Smoking status: Former Smoker    Packs/day: 0.30    Years: 0.30    Pack years: 0.09    Types: Cigarettes    Last attempt to quit: 12/06/1993    Years since quitting: 25.3   Smokeless tobacco: Never Used  Substance Use Topics   Alcohol use: No    Alcohol/week: 0.0 standard drinks   Drug use: Yes    Comment: OD attempts on home meds       Allergies   Kiwi extract; Cefepime; Trental [pentoxifylline]; and Nubain [nalbuphine hcl]   Review of Systems Review of Systems  Constitutional: Negative for fever.  Respiratory: Positive for shortness of breath and wheezing. Negative for cough.   Cardiovascular: Positive for chest pain.  Gastrointestinal: Negative for abdominal pain, nausea and vomiting.  Genitourinary: Negative for dysuria.  Musculoskeletal: Negative for back pain.  Neurological: Negative for numbness.  All other systems reviewed and are negative.    Physical Exam Updated Vital Signs BP (!) 154/76 (BP Location: Left Wrist) Comment (BP Location): Pt prefers  wrist   Pulse 93    Temp 98.2 F (  36.8 C) (Oral)    Resp 17    Ht 1.575 m (_0 )    Wt (!) 137.4 kg    LMP  (LMP Unknown)    SpO2 98%    BMI 55.42 kg/m   Physical Exam Vitals signs and nursing note reviewed.  Constitutional:      Appearance: She is well-developed.     Comments: Overly obese, nontoxic-appearing, no acute distress  HENT:     Head: Normocephalic and atraumatic.  Eyes:     Pupils: Pupils are equal, round, and reactive to light.  Cardiovascular:     Rate and Rhythm: Normal rate and regular rhythm.     Heart sounds: Normal heart sounds.  Pulmonary:     Effort: Pulmonary effort is normal. No respiratory distress.     Breath sounds: Wheezing present.     Comments: Breath sounds, limited by body habitus, occasional wheeze noted Abdominal:     Palpations: Abdomen is soft.  Musculoskeletal:     Right lower leg: She exhibits no tenderness.     Left lower leg: She exhibits no tenderness.     Comments: Amputation right foot all 5 digits, incision clean dry and intact, slight bruising noted on the medial aspect of the incision, no crepitus, no significant tenderness palpation, no erythema Left BKA  Skin:    General: Skin is warm and dry.  Neurological:     Mental Status: She is alert and oriented to person, place, and time.  Psychiatric:        Mood and Affect: Mood normal.      ED Treatments / Results  Labs (all labs ordered are listed, but only abnormal results are displayed) Labs Reviewed  I-STAT TROPONIN, ED    EKG EKG Interpretation  Date/Time:  Tuesday March 27 2019 05:48:44 EDT Ventricular Rate:  92 PR Interval:    QRS Duration: 99 QT Interval:  372 QTC Calculation: 461 R Axis:   75 Text Interpretation:  Sinus rhythm Low voltage, precordial leads Confirmed by Thayer Jew [phone]) on 03/27/2019 6:21:06 AM   Radiology Dg Chest Portable 1 View  Result Date: 03/27/2019 CLINICAL DATA:  43 year old female with shortness of breath. EXAM: PORTABLE  CHEST 1 VIEW COMPARISON:  11/11/2018 and earlier. FINDINGS: Portable AP semi upright view at 0546 hours. Mildly rotated to the left. Left PICC line removed since December. Lung volumes and mediastinal contours are within normal limits. Allowing for portable technique the lungs are clear. Visualized tracheal air column is within normal limits. No pneumothorax or pleural effusion. No acute osseous abnormality identified. IMPRESSION: Negative portable chest. Electronically Signed   By: Genevie Ann M.D.   On: 03/27/2019 06:42    Procedures Procedures (including critical care time)  Medications Ordered in ED Medications  albuterol (VENTOLIN HFA) 108 (90 Base) MCG/ACT inhaler 4 puff (4 puffs Inhalation Given 03/27/19 0643)  dexamethasone (DECADRON) injection 10 mg (10 mg Intravenous Given 03/27/19 0642)  ketorolac (TORADOL) 30 MG/ML injection 30 mg (30 mg Intravenous Given 03/27/19 0646)     Initial Impression / Assessment and Plan / ED Course  I have reviewed the triage vital signs and the nursing notes.  Pertinent labs & imaging results that were available during my care of the patient were reviewed by me and considered in my medical decision making (see chart for details).  Clinical Course as of Mar 26 701  Tue Mar 27, 2019  0701 On recheck, patient states that she feels much better after albuterol.  She  is overall nontoxic.  Satting 100% on room air.  She did request that I look at her right toe amputation site.  She has a small what appears to be blood blister at the medial aspect of her incision.  She had her toes amputated area.  No crepitus or significant tenderness to palpation.  No significant erythema.   [CH]    Clinical Course User Index [CH] Avamae Dehaan, Barbette Hair, MD       Patient presents with chest pain shortness of breath.  Onset this morning at 4:30 AM.  She is overall nontoxic and vital signs are reassuring.  She is satting 98% on room air and is in no respiratory distress.  Her body  habitus severely limits her pulmonary exam.  However, I do hear some wheezing.  She was given an MDI.  She denies any fever or infectious symptoms.  Low suspicion for infectious source.  Chest x-ray shows no evidence of pneumonia or pneumothorax.  Troponin is negative.  EKG shows no evidence of arrhythmia or ischemia.  Doubt ACS.  Patient improved with albuterol.  Of note, she did request a look at her amputation site on her right foot.  It is clean dry and intact.  She has a small area of bruising over the medial aspect of the incision.  No crepitus.  No evidence of infection.  I discussed with her that she needs to keep a close eye on this.  If it opens up, becomes painful or more erythematous, she may need further evaluation.  It may just be a pressure point at this time.  Patient stated understanding.  After history, exam, and medical workup I feel the patient has been appropriately medically screened and is safe for discharge home. Pertinent diagnoses were discussed with the patient. Patient was given return precautions.   Final Clinical Impressions(s) / ED Diagnoses   Final diagnoses:  Bronchitis    ED Discharge Orders         Ordered    albuterol (VENTOLIN HFA) 108 (90 Base) MCG/ACT inhaler  Every 4 hours PRN     03/27/19 0700           Jaziyah Gradel, Barbette Hair, MD 03/27/19 [phone]

## 2019-03-28 DIAGNOSIS — E1140 Type 2 diabetes mellitus with diabetic neuropathy, unspecified: Secondary | ICD-10-CM | POA: Diagnosis not present

## 2019-03-29 DIAGNOSIS — E1140 Type 2 diabetes mellitus with diabetic neuropathy, unspecified: Secondary | ICD-10-CM | POA: Diagnosis not present

## 2019-03-30 DIAGNOSIS — E1140 Type 2 diabetes mellitus with diabetic neuropathy, unspecified: Secondary | ICD-10-CM | POA: Diagnosis not present

## 2019-03-31 DIAGNOSIS — E1140 Type 2 diabetes mellitus with diabetic neuropathy, unspecified: Secondary | ICD-10-CM | POA: Diagnosis not present

## 2019-04-01 DIAGNOSIS — E1140 Type 2 diabetes mellitus with diabetic neuropathy, unspecified: Secondary | ICD-10-CM | POA: Diagnosis not present

## 2019-04-02 DIAGNOSIS — E1140 Type 2 diabetes mellitus with diabetic neuropathy, unspecified: Secondary | ICD-10-CM | POA: Diagnosis not present

## 2019-04-03 DIAGNOSIS — E1140 Type 2 diabetes mellitus with diabetic neuropathy, unspecified: Secondary | ICD-10-CM | POA: Diagnosis not present

## 2019-04-04 DIAGNOSIS — E1140 Type 2 diabetes mellitus with diabetic neuropathy, unspecified: Secondary | ICD-10-CM | POA: Diagnosis not present

## 2019-04-05 ENCOUNTER — Other Ambulatory Visit: Payer: Self-pay

## 2019-04-05 ENCOUNTER — Ambulatory Visit: Payer: Medicaid Other | Admitting: Physical Therapy

## 2019-04-05 ENCOUNTER — Encounter: Payer: Self-pay | Admitting: Physical Therapy

## 2019-04-05 DIAGNOSIS — M25652 Stiffness of left hip, not elsewhere classified: Secondary | ICD-10-CM

## 2019-04-05 DIAGNOSIS — E1140 Type 2 diabetes mellitus with diabetic neuropathy, unspecified: Secondary | ICD-10-CM | POA: Diagnosis not present

## 2019-04-05 DIAGNOSIS — M6281 Muscle weakness (generalized): Secondary | ICD-10-CM | POA: Diagnosis not present

## 2019-04-05 DIAGNOSIS — R2689 Other abnormalities of gait and mobility: Secondary | ICD-10-CM

## 2019-04-05 DIAGNOSIS — M21371 Foot drop, right foot: Secondary | ICD-10-CM

## 2019-04-05 DIAGNOSIS — M25651 Stiffness of right hip, not elsewhere classified: Secondary | ICD-10-CM

## 2019-04-05 DIAGNOSIS — R2681 Unsteadiness on feet: Secondary | ICD-10-CM

## 2019-04-05 DIAGNOSIS — R293 Abnormal posture: Secondary | ICD-10-CM

## 2019-04-05 NOTE — Therapy (Addendum)
[facility] [address] [place], [place], [address] Phone: [phone]   Fax:  [phone]  Physical Therapy Evaluation  Patient Details  Name: [name] MRN: [id number] Date of Birth: 16-Dec-1975 Referring Provider (PT): Rica Mote [place], [place] / [name], MD   Encounter Date: 04/05/2019  PT End of Session - 04/05/19 1741    Visit Number  1    Number of Visits  16    Date for PT Re-Evaluation  06/15/19    Authorization Type  Medicaid    PT Start Time  3267    PT Stop Time  1143    PT Time Calculation (min)  1301 min    Equipment Utilized During Treatment  Gait belt;Other (comment)   right healing shoe/sandal & left transtibial prosthesis   Activity Tolerance  Patient tolerated treatment well;Patient limited by pain    Behavior During Therapy  [facility] for tasks assessed/performed       CLINIC OPERATION CHANGES: [facility] is operating at a low capacity due to COVID-19.  The patient was brought into the clinic for evaluation and/or treatment following universal masking by staff, social distancing, and <10 people in the clinic.  The patient's COVID risk of complications score is 4.    Past Medical History:  Diagnosis Date  . Acute osteomyelitis of ankle or foot, left ([place]) 01/31/2018  . Alveolar hypoventilation   . Anemia    not on iron pill  . Asthma   . Bipolar 2 disorder ([place])   . Carpal tunnel syndrome on right    recurrent  . Cellulitis 08/2010-08/2011  . Chronic pain   . COPD (chronic obstructive pulmonary disease) ([name])    Symbicort daily and Proventil as needed  . Costochondritis   . Depression   . Diabetes mellitus type II, uncontrolled ([place]) 2000   Type 2, Uncontrolled.Takes Lantus daily.Fasting blood sugar runs 150  . Dizziness    occasionally  . Drug-seeking behavior   . GERD (gastroesophageal reflux disease)    takes Pantoprazole and Zantac daily  .  Headache    migraine-last one about a yr ago.Topamax daily  . HLD (hyperlipidemia)    takes Atorvastatin daily  . Hypertension    takes Lisinopril and Coreg daily  . Morbid obesity ([place])   . Muscle spasm    takes Flexeril as needed  . Nocturia   . OSA on CPAP   . Peripheral neuropathy    takes Gabapentin daily  . Pneumonia    "walking" several yrs ago and as a baby (12/05/2018)  . Rectal fissure   . Restless leg   . Urinary frequency   . Varicose veins    Right medial thigh and Left leg     Past Surgical History:  Procedure Laterality Date  . AMPUTATION Left 02/01/2018   Procedure: LEFT FOURTH AND 5TH TOE RAY AMPUTATION;  Surgeon: [name], MD;  Location: [facility];  Service: Orthopedics;  Laterality: Left;  . AMPUTATION Left 03/03/2018   Procedure: LEFT BELOW KNEE AMPUTATION;  Surgeon: [name], MD;  Location: [facility];  Service: Orthopedics;  Laterality: Left;  . CARPAL TUNNEL RELEASE Bilateral   . CESAREAN SECTION  2007  . KNEE ARTHROSCOPY Right 07/17/2010  . LEFT HEART CATHETERIZATION WITH CORONARY ANGIOGRAM N/A 07/27/2012   Procedure: LEFT HEART CATHETERIZATION WITH CORONARY ANGIOGRAM;  Surgeon: Sherren Mocha, MD;  Location: [facility] CATH LAB;  Service: Cardiovascular;  Laterality: N/A;  . MASS EXCISION N/A  06/29/2013   Procedure:  WIDE LOCAL EXCISION OF POSTERIOR NECK ABSCESS;  Surgeon: Ralene Ok, MD;  Location: [facility];  Service: General;  Laterality: N/A;  . REPAIR KNEE LIGAMENT Left    "fixed ligaments and chipped patella"    There were no vitals filed for this visit.   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 Her prosthesis is orginial with no socket revision. The prosthetist made an impression of right foot a couple of  weeks ago but unknown when will be delivered. She has blood blister & scab on right Transmetatarsal Amputation.     Pertinent History  Rt Transmetatarsal Amputation, LTTA, OA, COPD, asthma, DM2, depression, Neuropathy, Bipolar,     Limitations  Standing;Walking;House hold activities    Patient Stated Goals  She wants to walk in home & community with her prosthesis. Be active with 10yo daughter.     Currently in Pain?  Yes    Pain Score  0-No pain   in last week, standing & walking 10/10, afterwards when sitting 6-7/10   Pain Location  Foot    Pain Orientation  Right    Pain Descriptors / Indicators  Throbbing;Burning    Pain Type  Chronic pain    Pain Onset  More than a month ago    Pain Frequency  Intermittent    Aggravating Factors   standing & walking    Pain Relieving Factors  not standing or walking    Multiple Pain Sites  Yes    Pain Location  Leg   residual limb   Pain Orientation  Left    Pain Descriptors / Indicators  Burning    Pain Type  Acute pain    Pain Onset  More than a month ago    Pain Frequency  Intermittent    Aggravating Factors   standing with pressure on prosthesis    Pain Relieving Factors  sitting only    Pain Score  6    Pain Location  Knee    Pain Orientation  Right;Left    Pain Descriptors / Indicators  Throbbing    Pain Type  Chronic pain   arthritis   Pain Onset  More than a month ago    Pain Frequency  Intermittent    Aggravating Factors   standing & walking    Pain Relieving Factors  sitting         [facility] PT Assessment - 04/05/19 1400      Assessment   Medical Diagnosis  left transtibial Amputation / right Transmetatarsal amputation    Referring Provider (PT)  Everlean Patterson, [place] / [name], MD    Onset Date/Surgical Date  02/06/19   MD referral   Hand Dominance  Right    Prior Therapy  5 visits with last visit 10/12/2018      Precautions   Precautions  Fall      Balance Screen   Has the patient fallen in the past 6  months  No    Has the patient had a decrease in activity level because of a fear of falling?   Yes    Is the patient reluctant to leave their home because of a fear of falling?   Yes      Home Environment  Living Environment  Private residence    Living Arrangements  Children   41yo dtr   Type of [place] - 2 wheels;Bedside commode;Tub bench      Prior Function   Level of Independence  Independent;Independent with household mobility without device;Independent with community mobility without device    Vocation  On disability    Leisure  activities with dtr, read, bowling, movies      Posture/Postural Control   Posture/Postural Control  Postural limitations    Postural Limitations  Rounded Shoulders;Forward head;Flexed trunk;Weight shift right;Increased lumbar lordosis      ROM / Strength   AROM / PROM / Strength  Strength      Strength   Overall Strength  Deficits    Overall Strength Comments  Bil. hips grossly 3-4/5, bil. knee extension 4/5 & flexion 3/5, right ankle DF 3-/5      Transfers   Transfers  Sit to Stand;Stand to Sit    Sit to Stand  5: Supervision;With upper extremity assist;With armrests;From chair/3-in-1   needs RW for stabilization   Stand to Sit  5: Supervision;With upper extremity assist;With armrests;To chair/3-in-1   requires RW for stability     Ambulation/Gait   Ambulation/Gait  Yes    Ambulation/Gait Assistance  4: Min assist    Ambulation/Gait Assistance Details  excessive UE weight bearing on RW, partial weight bearing on TTA prosthesis,  right healing shoe creates leg length discrepancy with RLE longer than LLE    Ambulation Distance (Feet)  25 Feet    Assistive device  Rolling walker;Prosthesis;Other (Comment)   right healing shoe   Gait Pattern  Step-to pattern;Decreased step length - left;Decreased stance time - right;Decreased stride  length;Decreased dorsiflexion - right;Decreased weight shift to left;Left circumduction;Left hip hike;Right foot flat;Antalgic;Lateral hip instability;Trunk flexed;Abducted - left    Ambulation Surface  Level;Indoor      Balance   Balance Assessed  Yes      Static Standing Balance   Static Standing - Balance Support  Bilateral upper extremity supported   RW support   Static Standing - Level of Assistance  5: Stand by assistance    Static Standing - Comment/# of Minutes  2 minutes      Dynamic Standing Balance   Dynamic Standing - Balance Support  Left upper extremity supported;Bilateral upper extremity supported   scanning BUE with RW & reaching with dominant RUE   Dynamic Standing - Level of Assistance  4: Min assist    Dynamic Standing - Balance Activities  Head nods;Head turns;Reaching for objects    Dynamic Standing - Comments  scans environment with head movements only with BUE support on RW;  reaches 2" anteriorly with dominant RUE with LUE on RW      Prosthetics Assessment - 04/05/19 1100      Prosthetics   Prosthetic Care Dependent with  Skin check;Residual limb care;Care of non-amputated limb;Prosthetic cleaning;Ply sock cleaning;Correct ply sock adjustment;Proper wear schedule/adjustment;Proper weight-bearing schedule/adjustment    Prosthetic Care Comments   PT instructed to wear prosthesis daily for 4-6 hours only & will progressively increase time. Consistency in wear can help with pain. PT also instructed in supporting prosthesis in sitting  / not letting prosthesis dangle for long periods.      Donning prosthesis   Supervision    Doffing prosthesis   Supervision  Current prosthetic wear tolerance (days/week)   reports ~3 days/ wk mainly when going out of home    Current prosthetic wear tolerance (#hours/day)   reports most of awake hours on days that she wears prosthesis.     Current prosthetic weight-bearing tolerance (hours/day)   Pt tolerates standing for 2 minutes &  gait for 2 minutes. Pain increased to 5/10 with this limited weight bearing. She reports pain increases to 10/10 & remains high after weight bearing on Right foot or left prosthesis.     Edema  pitting edema    Residual limb condition   Right partial foot ampuation has blood blister medially & scab on lateral distal aspect.  Pt reports from positioning in bed from pressure.  PT demo positioning in bed including sidelying with pillows to decrease pressure. left residual limb has no open areas, cylinderical shape, normal color, temperature & hair growth.     K code/activity level with prosthetic use   K2 basic community / limited distances with fixed cadence               Objective measurements completed on examination: See above findings.                PT Short Term Goals - 04/05/19 1807      PT SHORT TERM GOAL #1   Title  Patient verbalizes consistant wear of prosthesis >/= 5 days/ wk for 6-8 hours. (All STGs target date 3 visits after evaluation)    Baseline  Patient wears prosthesis ~3 days/wk mainly when going out  with inconsistant times causing limb pain.     Time  3    Period  Weeks    Status  New      PT SHORT TERM GOAL #2   Title  Patient reports pain in residual limb & right foot <6/10. (All STGs target date 3 visits after evaluation)    Baseline  Patient in right foot & left residual limb up to 10/10 aggravated by standing.     Time  3    Period  Weeks    Status  New      PT SHORT TERM GOAL #3   Title  Standing balance with RW support reaching 5" with supervision. (All STGs target date 3 visits after evaluation)    Baseline  Standing balance with RW support reaching 2" with minimal assistance.    Time  3    Period  Weeks    Status  New      PT SHORT TERM GOAL #4   Title  Patient ambulates 29' with RW & prosthesis with supervisin. (All STGs target date 3 visits after evaluation)    Baseline  Patient ambulates 60' with RW & prosthesis with minimal  assistance.    Time  3    Period  Weeks    Status  New        PT Long Term Goals - 04/05/19 1816      PT LONG TERM GOAL #1   Title  Patient verbalizes & demonstrates proper prosthetic care to enable safe use of prosthesis. (All LTGs Target Date 15 visits after evaluation)    Baseline  Patient is dependent in proper prosthetic care & use.    Time  9    Period  Weeks    Status  New      PT LONG TERM GOAL #2   Title  Patient tolerates prosthesis wear daily for >90% of awake hours with  skin issues. (All LTGs Target Date 15 visits after evaluation)    Baseline  Patient is wearing prosthesis ~3 days/wk for inconsistant times causing pain issues after wear.     Time  9    Period  Weeks    Status  New      PT LONG TERM GOAL #3   Title  Patient reports residual limb, right foot & knee pain increases </= 3 increments with standing & gait activities.     Baseline  Patient reports residual limb pain, right foot pain & bilateral knee pain up to 10/10 with prosthesis wear, standing & gait.     Time  9    Period  Weeks    Status  New      PT LONG TERM GOAL #4   Title  Standing balance with RW support reaching 10" anteriorly, picking up objects from floor & scanning environment safely modified independent.  (All LTGs Target Date 15 visits after evaluation)    Baseline  Standing balance with RW support with minimal assist reaching 2" anteriorly.     Time  9    Period  Weeks    Status  New      PT LONG TERM GOAL #5   Title  Patient ambulates 150' with RW & prosthesis modified independent to enable basic community entry. (All LTGs Target Date 15 visits after evaluation)    Baseline  Patient ambulates 5' with RW & prosthesis with minimal assistance.     Time  9    Period  Weeks    Status  New      Additional Long Term Goals   Additional Long Term Goals  Yes      PT LONG TERM GOAL #6   Title  Patient negotiates stairs with 2 rails, ramp & curb with RW & prosthesis modified independent to  enable community access.  (All LTGs Target Date 15 visits after evaluation)    Baseline  Patient is non-ambulatory on stairs, ramps & curbs limiting community mobility.     Time  9    Period  Weeks    Status  New             Plan - 04/05/19 1746    Clinical Impression Statement  This 43yo female had a left Transtibial Ampuation on 03/03/2018 with 2 revision surgeries in May 2019. She recieved her first prosthesis 08/01/2018 and recieved only 5 treatment sessions before developing issues with left foot. She underwent a left great toe amputation then Transmetatarsal Amputation on 12/07/2018 with closure 12/26/2018. She has been w/c or bed bound for over 15 months which has led to deconditioning. Patient has flucuating wear of prosthesis with average of ~3 days/wk. She has left limb pain & right foot pain after weight bearing up to 10/10. Her balance is impaired with RW support required, scans with head movements only and minimal assist to reach only 2".  Her gait requires minimal assist with excessive weight bearing on RW, partial weight on prosthesis and gait deviations indicating high fall risk. Patient would benefit from skilled PT intervention to improve mobility & safety.     Personal Factors and Comorbidities  Comorbidity 3+;Fitness;Past/Current Experience;Finances;Time since onset of injury/illness/exacerbation    Comorbidities  left Transtibial amputation, right Transmetatarsal amputation, OA, asthma, DM, depression, neuropathy, Bipolar    Examination-Activity Limitations  Bed Mobility;Caring for Others;Carry;Lift;Locomotion Level;Stairs;Stand;Transfers    Examination-Participation Restrictions  Community Activity;Driving;Meal Prep;Other   parenting 41yo dtr   Stability/Clinical Decision  Making  Evolving/Moderate complexity    Clinical Decision Making  Moderate    Rehab Potential  Good    PT Frequency  2x / week   1x/wk for 3 weeks, then 2x/wk for 6 weeks   PT Duration  Other (comment)    9 weeks   PT Treatment/Interventions  ADLs/Self Care Home Management;DME Instruction;Gait training;Stair training;Functional mobility training;Therapeutic activities;Therapeutic exercise;Balance training;Neuromuscular re-education;Patient/family education;Prosthetic Training;Orthotic Fit/Training;Vestibular    PT Next Visit Plan  review prosthetic care including progressing consistent wear,  HEP for balance, prosthetic gait with RW    Recommended Other Services  right partial foot prosthesis / insert    Consulted and Agree with Plan of Care  Patient       Patient will benefit from skilled therapeutic intervention in order to improve the following deficits and impairments:  Abnormal gait, Decreased activity tolerance, Decreased balance, Decreased endurance, Decreased knowledge of use of DME, Decreased mobility, Decreased range of motion, Decreased strength, Dizziness, Impaired flexibility, Postural dysfunction, Prosthetic Dependency, Obesity, Pain  Visit Diagnosis: Other abnormalities of gait and mobility  Muscle weakness (generalized)  Abnormal posture  Unsteadiness on feet  Foot drop, right  Stiffness of left hip, not elsewhere classified  Stiffness of right hip, not elsewhere classified     Problem List Patient Active Problem List   Diagnosis Date Noted  . Infection of right foot   . Osteomyelitis ([place]) 12/05/2018  . Cellulitis of great toe of right foot 10/29/2018  . Osteomyelitis of great toe of right foot ([place]) 10/29/2018  . Vulvovaginitis 10/23/2018  . Encounter for orthopedic aftercare following surgical amputation 06/28/2018  . Uncontrolled type 2 diabetes mellitus with insulin therapy ([place]) 04/16/2018  . Amputation stump infection ([place]) 04/09/2018  . Allergic rhinitis 03/06/2018  . Unilateral complete BKA, left, sequela ([place])   . Class 3 severe obesity due to excess calories with serious comorbidity and body mass index (BMI) of 50.0 to 59.9 in adult [facility])   .  Decreased pedal pulses 06/10/2017  . Unilateral primary osteoarthritis, right knee 10/22/2016  . Primary osteoarthritis of first carpometacarpal joint of left hand 07/30/2016  . Diabetic neuropathy ([place]) 07/14/2016  . Syncope 02/25/2016  . De Quervain's tenosynovitis, bilateral 11/01/2015  . Vitamin D deficiency 09/05/2015  . Recurrent candidiasis of vagina 09/05/2015  . Varicose veins of leg with complications 19/50/9326  . Restless leg syndrome 10/17/2014  . Chronic sinusitis 07/18/2014  . Headache 07/15/2014  . Urge incontinence 10/15/2013  . Encounter for chronic pain management 06/30/2013  . HLD (hyperlipidemia) 11/19/2012  . Chest pain 06/27/2012  . Right carpal tunnel syndrome 09/01/2011  . Bilateral knee pain 09/01/2011  . DM (diabetes mellitus) type II uncontrolled, periph vascular disorder ([place]) 05/22/2008  . Morbid obesity ([place]) 05/22/2008  . 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 Depression 05/22/2008  . Obstructive sleep apnea 05/22/2008  . Hypertension 05/22/2008  . Asthma 05/22/2008  . GERD 05/22/2008    Loralyn Rachel PT, DPT 04/05/2019, 6:35 PM  [facility] [address] [place], [place], [address] Phone: [phone]   Fax:  [phone]  Name: [name] MRN: [id number] Date of Birth: [birth year]

## 2019-04-06 DIAGNOSIS — E1140 Type 2 diabetes mellitus with diabetic neuropathy, unspecified: Secondary | ICD-10-CM | POA: Diagnosis not present

## 2019-04-07 DIAGNOSIS — E1140 Type 2 diabetes mellitus with diabetic neuropathy, unspecified: Secondary | ICD-10-CM | POA: Diagnosis not present

## 2019-04-08 DIAGNOSIS — E1140 Type 2 diabetes mellitus with diabetic neuropathy, unspecified: Secondary | ICD-10-CM | POA: Diagnosis not present

## 2019-04-09 DIAGNOSIS — E1140 Type 2 diabetes mellitus with diabetic neuropathy, unspecified: Secondary | ICD-10-CM | POA: Diagnosis not present

## 2019-04-10 DIAGNOSIS — E1140 Type 2 diabetes mellitus with diabetic neuropathy, unspecified: Secondary | ICD-10-CM | POA: Diagnosis not present

## 2019-04-11 ENCOUNTER — Ambulatory Visit (INDEPENDENT_AMBULATORY_CARE_PROVIDER_SITE_OTHER): Payer: Medicaid Other | Admitting: Internal Medicine

## 2019-04-11 ENCOUNTER — Encounter: Payer: Self-pay | Admitting: Internal Medicine

## 2019-04-11 ENCOUNTER — Other Ambulatory Visit: Payer: Self-pay

## 2019-04-11 DIAGNOSIS — S98131A Complete traumatic amputation of one right lesser toe, initial encounter: Secondary | ICD-10-CM | POA: Diagnosis not present

## 2019-04-11 DIAGNOSIS — Z89512 Acquired absence of left leg below knee: Secondary | ICD-10-CM | POA: Diagnosis not present

## 2019-04-11 DIAGNOSIS — E1140 Type 2 diabetes mellitus with diabetic neuropathy, unspecified: Secondary | ICD-10-CM | POA: Diagnosis not present

## 2019-04-11 DIAGNOSIS — E1142 Type 2 diabetes mellitus with diabetic polyneuropathy: Secondary | ICD-10-CM

## 2019-04-11 DIAGNOSIS — E1165 Type 2 diabetes mellitus with hyperglycemia: Secondary | ICD-10-CM | POA: Diagnosis not present

## 2019-04-11 DIAGNOSIS — E781 Pure hyperglyceridemia: Secondary | ICD-10-CM | POA: Insufficient documentation

## 2019-04-11 DIAGNOSIS — Z794 Long term (current) use of insulin: Secondary | ICD-10-CM | POA: Diagnosis not present

## 2019-04-11 HISTORY — DX: Acquired absence of left leg below knee: Z89.512

## 2019-04-11 MED ORDER — INSULIN ISOPHANE & REGULAR (HUMAN 70-30)100 UNIT/ML KWIKPEN: PEN_INJECTOR | SUBCUTANEOUS | 11 refills | Status: DC

## 2019-04-11 NOTE — Progress Notes (Signed)
Virtual Visit via Video Note  I connected with [name] on 04/11/19 at  2:00 PM EDT by a video enabled telemedicine application and verified that I am speaking with the correct person using two identifiers.   I discussed the limitations of evaluation and management by telemedicine and the availability of in person appointments. The patient expressed understanding and agreed to proceed.   -Location of the patient : Home -Location of the provider : Office -The names of all persons participating in the telemedicine service : Pt and myself        Name: [name]  Age/ Sex: 43 y.o., female   MRN/ DOB: [id number], 1976-02-07     PCP: [name], MD   Reason for Endocrinology Evaluation: Type 2 Diabetes Mellitus     Initial Endocrinology Clinic Visit: 05/13/2014    PATIENT IDENTIFIER: [name] is a 43 y.o. 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 The patient has followed with Endocrinology clinic since 05/13/2014 for consultative assistance with management of her diabetes.  DIABETIC HISTORY:  [name] was diagnosed with T2DM in 2001 when she was hospitalized for symptomatic hyperglycemia. She was started insulin therapy in 2007. Her hemoglobin A1c has ranged from 9.6% in 2019, peaking at 14.6% in 2012.  On her initial visit to our clinic her A1c was 14.0% . She was on Metformin, byetta , novolog and Lantus   She is intolerant to metformin She was lost to follow from 2018-2020, upon her presentation back to our clinic in 2020, she was on Novolog Mix 70/30 .   SUBJECTIVE:    Today (04/11/2019): [name] is here for a virtual follow up visit on diabetes management. She is transferring care from Dr. Loanne Drilling  She checks her blood sugars 2 times daily, preprandial to breakfast and supper. The patient has not had hypoglycemic episodes since the last clinic visit. Otherwise, the  patient has not required any recent emergency interventions for hypoglycemia and has not had recent hospitalizations secondary to hyper or hypoglycemic episodes.   Pt eats 2 meals a day, snacks on apples, grapes and apple sauce.   ROS: As per HPI and as detailed below: Review of Systems  Constitutional: Negative for fever.  HENT: Negative for congestion and sore throat.   Respiratory: Negative for cough and shortness of breath.   Cardiovascular: Positive for palpitations. Negative for chest pain.  Genitourinary: Positive for frequency.  Neurological: Positive for tingling.  Endo/Heme/Allergies: Positive for polydipsia.  Psychiatric/Behavioral: Positive for depression. The patient is nervous/anxious.       HOME DIABETES REGIMEN:  Humulin Mix (70/30) 60 units QAM, 40 units QPM  Statin: yes ACE-I/ARB: no   GLUCOSE LOG:  Date Breakfast  Supper  04/11/2019 120   5/5 100 200  5/4 120 225  5/3 100 235     DIABETIC COMPLICATIONS: Microvascular complications:   Neuropathy( S/P Left BKA and Right toes amputations)  Denies: retinopathy, CKD  Last Eye Exam: Completed 04/2018  Macrovascular complications:    Denies: CAD, CVA, PVD   HISTORY:  Past Medical History:  Past Medical History:  Diagnosis Date  . Acute osteomyelitis of ankle or foot, left ([place]) 01/31/2018  . Alveolar hypoventilation   . Anemia    not on iron pill  . Asthma   . Bipolar 2 disorder ([place])   . Carpal tunnel syndrome on right    recurrent  .  Cellulitis 08/2010-08/2011  . Chronic pain   . COPD (chronic obstructive pulmonary disease) ([name])    Symbicort daily and Proventil as needed  . Costochondritis   . Depression   . Diabetes mellitus type II, uncontrolled ([place]) 2000   Type 2, Uncontrolled.Takes Lantus daily.Fasting blood sugar runs 150  . Dizziness    occasionally  . Drug-seeking behavior   . GERD (gastroesophageal reflux disease)    takes Pantoprazole and Zantac daily  . Headache     migraine-last one about a yr ago.Topamax daily  . HLD (hyperlipidemia)    takes Atorvastatin daily  . Hypertension    takes Lisinopril and Coreg daily  . Morbid obesity ([place])   . Muscle spasm    takes Flexeril as needed  . Nocturia   . OSA on CPAP   . Peripheral neuropathy    takes Gabapentin daily  . Pneumonia    "walking" several yrs ago and as a baby (12/05/2018)  . Rectal fissure   . Restless leg   . Urinary frequency   . Varicose veins    Right medial thigh and Left leg    Past Surgical History:  Past Surgical History:  Procedure Laterality Date  . AMPUTATION Left 02/01/2018   Procedure: LEFT FOURTH AND 5TH TOE RAY AMPUTATION;  Surgeon: [name], MD;  Location: [facility];  Service: Orthopedics;  Laterality: Left;  . AMPUTATION Left 03/03/2018   Procedure: LEFT BELOW KNEE AMPUTATION;  Surgeon: [name], MD;  Location: [facility];  Service: Orthopedics;  Laterality: Left;  . CARPAL TUNNEL RELEASE Bilateral   . CESAREAN SECTION  2007  . KNEE ARTHROSCOPY Right 07/17/2010  . LEFT HEART CATHETERIZATION WITH CORONARY ANGIOGRAM N/A 07/27/2012   Procedure: LEFT HEART CATHETERIZATION WITH CORONARY ANGIOGRAM;  Surgeon: Sherren Mocha, MD;  [facility];  Service: Cardiovascular;  Laterality: N/A;  . MASS EXCISION N/A 06/29/2013   Procedure:  WIDE LOCAL EXCISION OF POSTERIOR NECK ABSCESS;  Surgeon: Ralene Ok, MD;  Location: [facility];  Service: General;  Laterality: N/A;  . REPAIR KNEE LIGAMENT Left    "fixed ligaments and chipped patella"    Social History:  reports that she quit smoking about 25 years ago. Her smoking use included cigarettes. She has a 0.09 pack-year smoking history. She has never used smokeless tobacco. She reports current drug use. She reports that she does not drink alcohol. Family History:  Family History  Problem Relation Age of Onset  . Diabetes Mother   . Hyperlipidemia Mother   . Depression Mother   . Varicose Veins Mother   . Heart attack  Paternal Uncle   . Heart disease Paternal Grandmother   . Heart attack Paternal Grandmother   . Heart attack Paternal Grandfather   . Heart disease Paternal Grandfather   . Heart attack Father   . Cancer Maternal Grandmother        COLON  . Hypertension Maternal Grandmother   . Hyperlipidemia Maternal Grandmother   . Diabetes Maternal Grandmother   . Other Maternal Grandfather        GUN SHOT     HOME MEDICATIONS: Allergies as of 04/11/2019      Reactions   Kiwi Extract Shortness Of Breath, Swelling   Cefepime    AKI, see records from [facility] hospitalization in January 2020   Trental [pentoxifylline] Nausea And Vomiting   Nubain [nalbuphine Hcl] Other (See Comments)   "FEELS LIKE SOMETHING CRAWLING ON ME"      Medication List  Accurate as of Apr 11, 2019  2:33 PM. Always use your most recent med list.        Accu-Chek Nano SmartView w/Device Kit Use to check blood sugar 3 times daily   Accu-Chek Softclix Lancets lancets Use as instructed to check blood sugar 3 times per day   albuterol 108 (90 Base) MCG/ACT inhaler Commonly known as:  VENTOLIN HFA Inhale 2 puffs into the lungs every 4 (four) hours as needed for wheezing or shortness of breath.   atorvastatin 20 MG tablet Commonly known as:  LIPITOR Take 1 tablet (20 mg total) by mouth daily.   budesonide-formoterol 160-4.5 MCG/ACT inhaler Commonly known as:  Symbicort inhale 2 PUFFS into THE lungs 2 TIMES DAILY   Cholecalciferol 25 MCG (1000 UT) tablet Take 1 tablet (1,000 Units total) by mouth daily.   cyclobenzaprine 5 MG tablet Commonly known as:  FLEXERIL Take 1 tablet (5 mg total) by mouth 3 (three) times daily as needed for muscle spasms.   gabapentin 600 MG tablet Commonly known as:  NEURONTIN Take 2 tablets (1,200 mg total) by mouth 3 (three) times daily.   gentamicin cream 0.1 % Commonly known as:  GARAMYCIN Apply 1 application topically 3 (three) times daily.   glucose blood test strip  Commonly known as:  Accu-Chek Guide USE T0 CHECK BLOOD SUGAR 3 TIMES DAILY   HYDROcodone-acetaminophen 10-325 MG tablet Commonly known as:  NORCO Take 1 tablet by mouth every 8 (eight) hours as needed.   HYDROcodone-acetaminophen 10-325 MG tablet Commonly known as:  NORCO Take 1 tablet by mouth every 8 (eight) hours as needed.   HYDROcodone-acetaminophen 10-325 MG tablet Commonly known as:  NORCO Take 1 tablet by mouth every 8 (eight) hours as needed.   Insulin Isophane & Regular Human (70-30) 100 UNIT/ML PEN Commonly known as:  HUMULIN 70/30 MIX Inject 70 Units into the skin daily with breakfast AND 35 Units daily with supper.   pantoprazole 40 MG tablet Commonly known as:  PROTONIX Take 1 tablet (40 mg total) by mouth daily.         DATA REVIEWED:  Lab Results  Component Value Date   HGBA1C 10.1 (H) 12/06/2018   HGBA1C 9.6 (A) 04/25/2018   HGBA1C 13.8 (H) 01/27/2018   Lab Results  Component Value Date   MICROALBUR 17.4 (H) 01/24/2015   LDLCALC Comment 09/19/2018   CREATININE 1.03 (H) 01/02/2019   Lab Results  Component Value Date   MICRALBCREAT 728.0 (H) 01/24/2015     Lab Results  Component Value Date   CHOL 343 (H) 09/19/2018   HDL 35 (L) 09/19/2018   LDLCALC Comment 09/19/2018   LDLDIRECT 164.9 11/15/2014   TRIG 456 (H) 09/19/2018   CHOLHDL 9.8 (H) 09/19/2018         ASSESSMENT / PLAN / RECOMMENDATIONS:   1) Type 2 Diabetes Mellitus, Poorly controlled, With neuropathic complications - Most recent A1c of 10.1 %. Goal A1c < 7.0 %.   Plan: - Historically poorly controlled diabetes due to medication non-adherence and dietary indiscretions.  - I have discussed with the patient the pathophysiology of diabetes. We went over the natural progression of the disease. We talked about both insulin resistance and insulin deficiency. We stressed the importance of lifestyle changes including diet and exercise. I explained the complications associated with  diabetes including retinopathy, nephropathy, neuropathy as well as increased risk of cardiovascular disease. We went over the benefit seen with glycemic control.  - Discussed pharmacokinetics of basal/bolus insulin and  the importance of taking prandial insulin with meals.  We also discussed avoiding sugar-sweetened beverages and snacks, when possible.    MEDICATIONS:  Increase Humalog mix (70/30) to 70 units with Breakfast   Decrease Humalog Mix (70/30) to 35 units with Supper   EDUCATION / INSTRUCTIONS:  BG monitoring instructions: Patient is instructed to check her blood sugars 2 times a day, fasting and supper time.  Call [facility] if: BG persistently < 70 or > 300. . I reviewed the Rule of 15 for the treatment of hypoglycemia in detail with the patient. Literature supplied.   2) Diabetic complications:   Eye: Does not have known diabetic retinopathy.   Neuro/ Feet: Does  have known diabetic peripheral neuropathy .   Renal: Patient does not have known baseline CKD. She is not on an ACEI/ARB at present.   3) Lipids: Patient is  on a statin. Her TG are very high, we discussed risk of pancreatitis with high TG. I am hoping her TG will improve with reduces CHO intake and BG improvement, if not will consider adding a fibrate.   4) Left BKA, Right toes amputations     I discussed the assessment and treatment plan with the patient. The patient was provided an opportunity to ask questions and all were answered. The patient agreed with the plan and demonstrated an understanding of the instructions.   The patient was advised to call back or seek an in-person evaluation if the symptoms worsen or if the condition fails to improve as anticipated.    F/U in 2 months    Signed electronically by: Mack Guise, MD  [facility]  [address] [place],  [address] Phone: [phone] FAX: [phone]    CC: [name], [place] [address] Phone: [phone]  Fax: [phone]  Return to Endocrinology clinic as below: Future Appointments  Date Time Provider [place]  04/13/2019 10:50 AM [name], MD [facility]  04/18/2019  2:45 PM Drema Balzarine, PTA [facility]  04/26/2019  2:45 PM Isaias Cowman, PT [facility]  05/02/2019  2:45 PM Drema Balzarine, PTA [facility]

## 2019-04-12 ENCOUNTER — Ambulatory Visit: Payer: Medicaid Other | Admitting: Physical Therapy

## 2019-04-12 DIAGNOSIS — E1140 Type 2 diabetes mellitus with diabetic neuropathy, unspecified: Secondary | ICD-10-CM | POA: Diagnosis not present

## 2019-04-13 ENCOUNTER — Other Ambulatory Visit: Payer: Self-pay

## 2019-04-13 ENCOUNTER — Telehealth: Payer: Medicaid Other | Admitting: Family Medicine

## 2019-04-13 DIAGNOSIS — E1140 Type 2 diabetes mellitus with diabetic neuropathy, unspecified: Secondary | ICD-10-CM | POA: Diagnosis not present

## 2019-04-13 NOTE — Progress Notes (Signed)
10:55am attempted to reach patient by phone, no answer. LVM asking her to call back if she would like to proceed with the visit. Will send her a text message as well through doximity dialer inviting her to video visit, in case she was anticipating that.  11:15 patient has not called back or joined video call. Visit changed to "left without being seen".  [name], MD

## 2019-04-14 DIAGNOSIS — E1140 Type 2 diabetes mellitus with diabetic neuropathy, unspecified: Secondary | ICD-10-CM | POA: Diagnosis not present

## 2019-04-15 DIAGNOSIS — E1140 Type 2 diabetes mellitus with diabetic neuropathy, unspecified: Secondary | ICD-10-CM | POA: Diagnosis not present

## 2019-04-16 DIAGNOSIS — E1140 Type 2 diabetes mellitus with diabetic neuropathy, unspecified: Secondary | ICD-10-CM | POA: Diagnosis not present

## 2019-04-17 DIAGNOSIS — E1140 Type 2 diabetes mellitus with diabetic neuropathy, unspecified: Secondary | ICD-10-CM | POA: Diagnosis not present

## 2019-04-18 ENCOUNTER — Ambulatory Visit: Payer: Medicaid Other | Admitting: Physical Therapy

## 2019-04-18 DIAGNOSIS — E1140 Type 2 diabetes mellitus with diabetic neuropathy, unspecified: Secondary | ICD-10-CM | POA: Diagnosis not present

## 2019-04-19 ENCOUNTER — Other Ambulatory Visit: Payer: Self-pay

## 2019-04-19 ENCOUNTER — Telehealth (INDEPENDENT_AMBULATORY_CARE_PROVIDER_SITE_OTHER): Payer: Medicaid Other | Admitting: Family Medicine

## 2019-04-19 DIAGNOSIS — K219 Gastro-esophageal reflux disease without esophagitis: Secondary | ICD-10-CM

## 2019-04-19 DIAGNOSIS — E1165 Type 2 diabetes mellitus with hyperglycemia: Secondary | ICD-10-CM

## 2019-04-19 DIAGNOSIS — E1140 Type 2 diabetes mellitus with diabetic neuropathy, unspecified: Secondary | ICD-10-CM | POA: Diagnosis not present

## 2019-04-19 DIAGNOSIS — E1151 Type 2 diabetes mellitus with diabetic peripheral angiopathy without gangrene: Secondary | ICD-10-CM

## 2019-04-19 DIAGNOSIS — IMO0002 Reserved for concepts with insufficient information to code with codable children: Secondary | ICD-10-CM

## 2019-04-19 DIAGNOSIS — J45909 Unspecified asthma, uncomplicated: Secondary | ICD-10-CM

## 2019-04-19 DIAGNOSIS — G8929 Other chronic pain: Secondary | ICD-10-CM

## 2019-04-19 MED ORDER — CYCLOBENZAPRINE HCL 5 MG PO TABS: 5.0000 mg | ORAL_TABLET | Freq: Three times a day (TID) | ORAL | 1 refills | Status: DC | PRN

## 2019-04-19 MED ORDER — HYDROCODONE-ACETAMINOPHEN 10-325 MG PO TABS: 1.0000 | ORAL_TABLET | Freq: Three times a day (TID) | ORAL | 0 refills | Status: DC | PRN

## 2019-04-19 NOTE — Assessment & Plan Note (Signed)
>>  ASSESSMENT AND PLAN FOR INSULIN DEPENDENT TYPE 2 DIABETES MELLITUS ([place]) WRITTEN ON 04/19/2019 12:00 PM BY Sammye Staff, [name], MD  Now following with endocrinology - appreciate their management.

## 2019-04-19 NOTE — Assessment & Plan Note (Signed)
Well-controlled.  Continue current regimen. 

## 2019-04-19 NOTE — Assessment & Plan Note (Signed)
Now following with endocrinology - appreciate their management.

## 2019-04-19 NOTE — Assessment & Plan Note (Signed)
[place] PMP reviewed with appropriate findings. Patient tolerating medication well, with good improvement in pain. Benefit continues to outweigh risks. Refill x3 months.

## 2019-04-19 NOTE — Progress Notes (Signed)
[facility] Telemedicine Visit  Patient consented to have virtual visit. Method of visit: Video via doximity  Encounter participants: Patient: [name] - located at home Provider: [name] - located at office Others (if applicable): n/a  Chief Complaint: medication refill  HPI:  Chronic pain - taking norco 10-325mg  one tablet three times daily as needed for pain. No excessive sedation. Pain reasonably well controlled. No SI/HI. Storing medication safely. Patient gets good relief with this medication and prefers to continue taking it, believes benefit outweighs risks. Also needs refill on flexeril, which she takes just occasionally.  Asthma - taking symbicort twice daily. Using albuterol sparingly, hasn't needed in a few weeks. Overall thinks breathing is doing well.   GERD - takes protonix 40mg  daily. Says GERD symptoms are well controlled with this medication.  Diabetes - now seeing a new endocrinologist for her diabetes. Has not recently had A1c checked due to virtual visits & COVID pandemic.  Foot ulcer - has small area of blister on foot, and another spot with a scab that has a bit of pus draining. No fevers, redness. Is not draining a lot, just a small amount of gooey material.  ROS: per HPI  Pertinent PMHx: morbid obesity, diabetes, s/p BKA and transmetatarsal amputation  Exam:  Respiratory: Patient speaking normally in full sentences throughout the encounter, without any respiratory distress evident.  Extremities: RLE s/p transmetatarsal amputation. Medial aspect of foot with small area of scabbing, no visible erythema or copious drainage. Exam limited by video quality.  Assessment/Plan:  DM (diabetes mellitus) type II uncontrolled, periph vascular disorder [facility]) Now following with endocrinology - appreciate their management.  Encounter for chronic pain management [place] PMP reviewed with appropriate findings. Patient tolerating medication  well, with good improvement in pain. Benefit continues to outweigh risks. Refill x3 months.   GERD Well controlled. Continue current regimen.   Asthma Well controlled. Continue current regimen.    Foot scab - no obvious signs of significant infection. Patient will monitor it at home and let me know if it is worsening.  Time spent during visit with patient: 15 minutes

## 2019-04-20 DIAGNOSIS — E1140 Type 2 diabetes mellitus with diabetic neuropathy, unspecified: Secondary | ICD-10-CM | POA: Diagnosis not present

## 2019-04-21 DIAGNOSIS — E1140 Type 2 diabetes mellitus with diabetic neuropathy, unspecified: Secondary | ICD-10-CM | POA: Diagnosis not present

## 2019-04-22 DIAGNOSIS — E1140 Type 2 diabetes mellitus with diabetic neuropathy, unspecified: Secondary | ICD-10-CM | POA: Diagnosis not present

## 2019-04-23 ENCOUNTER — Other Ambulatory Visit: Payer: Self-pay | Admitting: Family Medicine

## 2019-04-23 DIAGNOSIS — E1140 Type 2 diabetes mellitus with diabetic neuropathy, unspecified: Secondary | ICD-10-CM | POA: Diagnosis not present

## 2019-04-24 DIAGNOSIS — E1140 Type 2 diabetes mellitus with diabetic neuropathy, unspecified: Secondary | ICD-10-CM | POA: Diagnosis not present

## 2019-04-25 DIAGNOSIS — E1140 Type 2 diabetes mellitus with diabetic neuropathy, unspecified: Secondary | ICD-10-CM | POA: Diagnosis not present

## 2019-04-26 ENCOUNTER — Ambulatory Visit: Payer: Medicaid Other | Admitting: Physical Therapy

## 2019-04-26 DIAGNOSIS — E1140 Type 2 diabetes mellitus with diabetic neuropathy, unspecified: Secondary | ICD-10-CM | POA: Diagnosis not present

## 2019-04-27 DIAGNOSIS — E1140 Type 2 diabetes mellitus with diabetic neuropathy, unspecified: Secondary | ICD-10-CM | POA: Diagnosis not present

## 2019-04-27 IMAGING — CR DG KNEE 1-2V*L*
3 series · 3 of 3 positions shown · non-contrast
Comparison: None.

CLINICAL DATA: Status post amputation

EXAM:
LEFT KNEE - 1-2 VIEW

[knee ap]
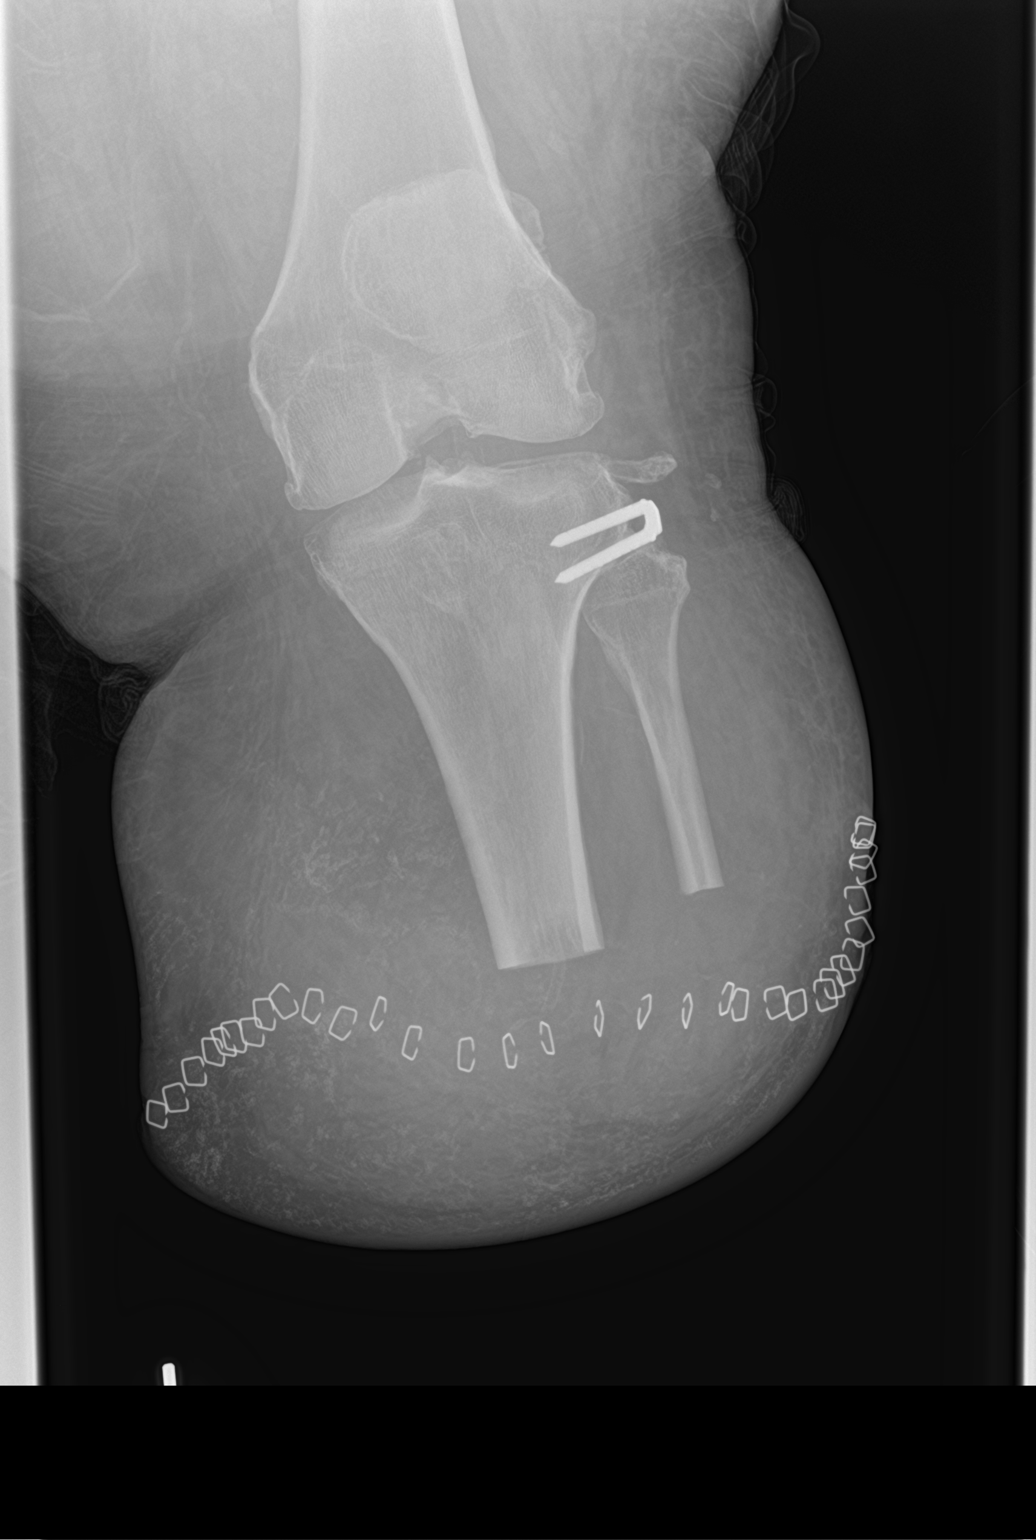

[knee lat (1 of 2)]
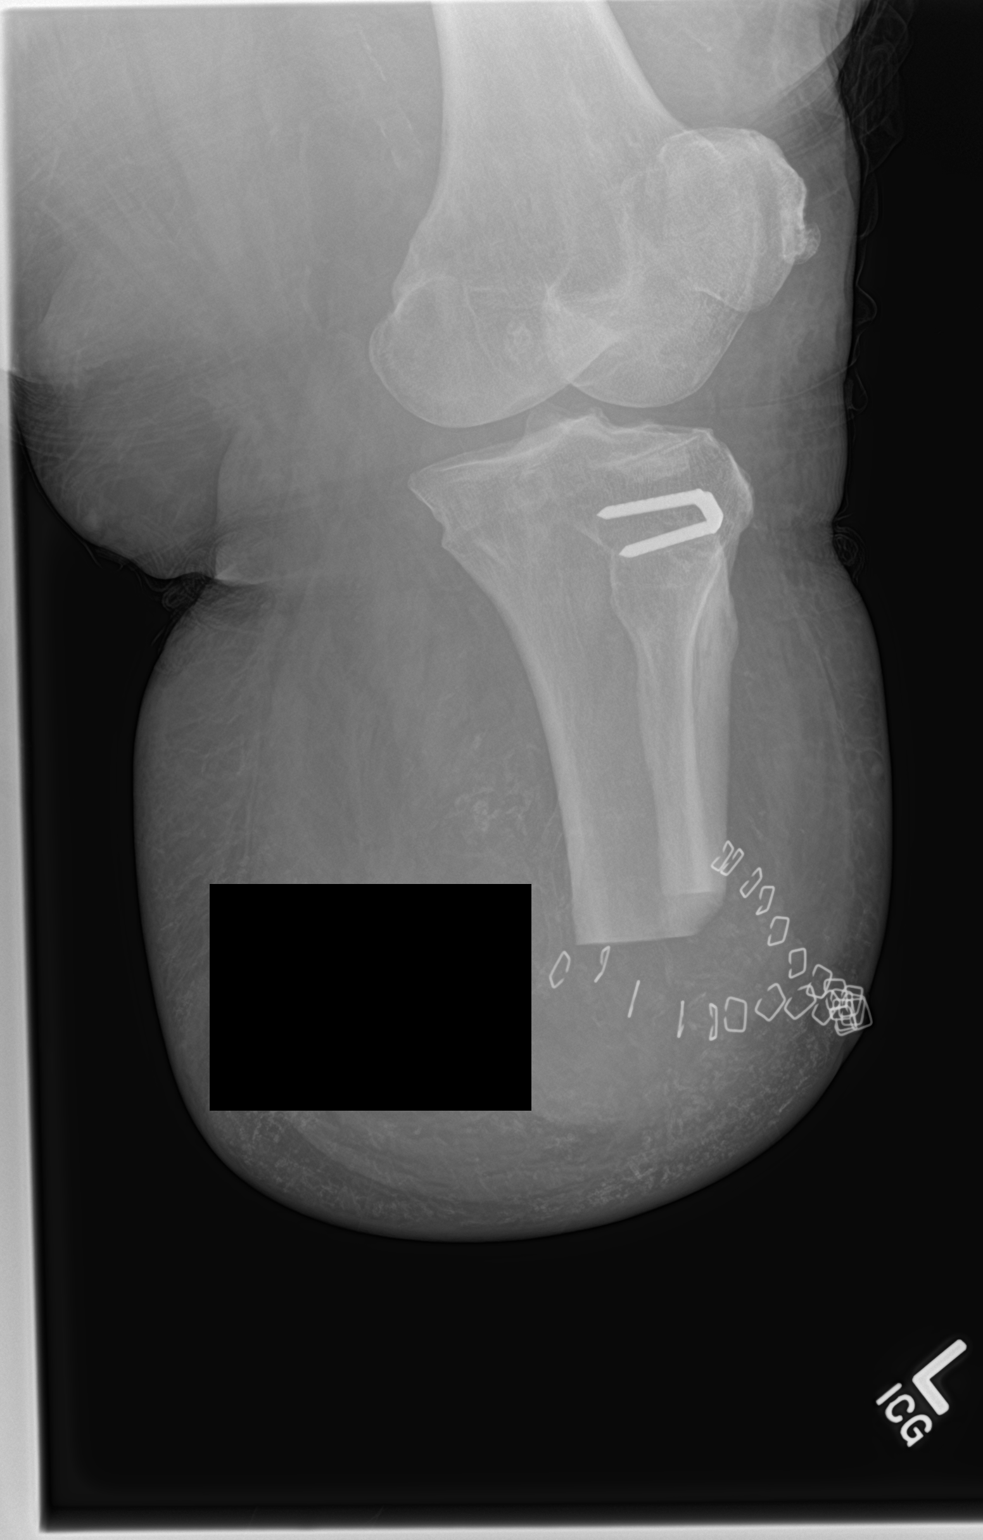

[knee lat (2 of 2)]
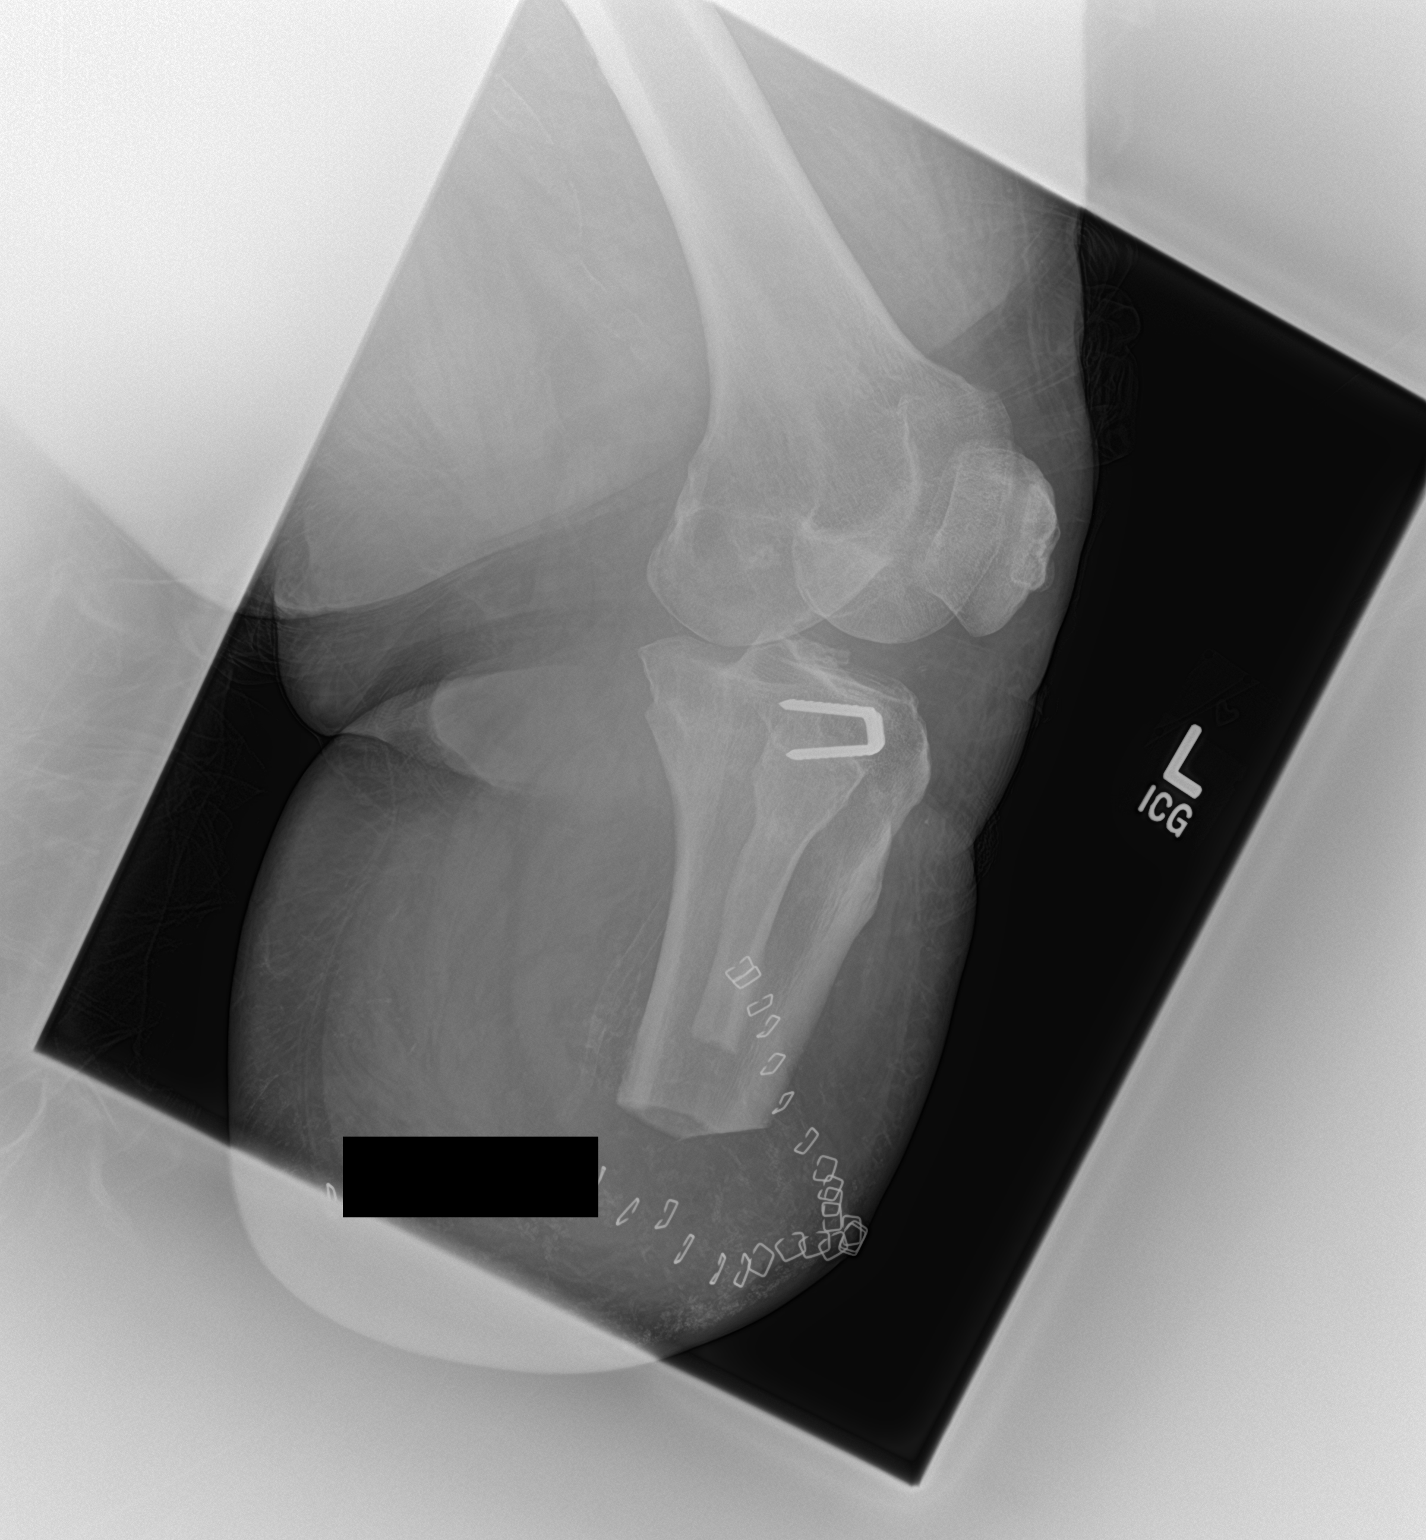

[3 of 3 positions shown; findings below may reference images not displayed]

FINDINGS: The patient is status post amputation through the proximal left
tibia and fibula. Degenerative changes in the knee. No other acute
abnormalities. Vascular calcifications.
IMPRESSION: Below the knee amputation.

## 2019-04-28 DIAGNOSIS — E1140 Type 2 diabetes mellitus with diabetic neuropathy, unspecified: Secondary | ICD-10-CM | POA: Diagnosis not present

## 2019-04-29 DIAGNOSIS — E1140 Type 2 diabetes mellitus with diabetic neuropathy, unspecified: Secondary | ICD-10-CM | POA: Diagnosis not present

## 2019-05-01 DIAGNOSIS — E1140 Type 2 diabetes mellitus with diabetic neuropathy, unspecified: Secondary | ICD-10-CM | POA: Diagnosis not present

## 2019-05-02 ENCOUNTER — Ambulatory Visit: Payer: Medicaid Other | Attending: Family Medicine | Admitting: Physical Therapy

## 2019-05-02 ENCOUNTER — Telehealth: Payer: Self-pay | Admitting: Physical Therapy

## 2019-05-02 ENCOUNTER — Telehealth: Payer: Self-pay | Admitting: Family Medicine

## 2019-05-02 ENCOUNTER — Telehealth: Payer: Self-pay

## 2019-05-02 DIAGNOSIS — E1140 Type 2 diabetes mellitus with diabetic neuropathy, unspecified: Secondary | ICD-10-CM | POA: Diagnosis not present

## 2019-05-02 NOTE — Telephone Encounter (Signed)
Completed PA info in [place] for Hydrocodone.  Status pending.  Will recheck status in 24 hours. Christen Bame, CMA

## 2019-05-02 NOTE — Telephone Encounter (Signed)
**  After Hours/ Emergency Line Call**  Received a call to report that [name] called the after hours emergency line. Called patient back. No answer. LVM that if she was having an emergency she needed to call 911 or go to the nearest ER. Will forward to PCP.      Bonnita Hollow, MD PGY-2, [facility] 05/02/2019 9:41 PM

## 2019-05-02 NOTE — Telephone Encounter (Signed)
Called pt about missed appointment today. Pt stated she thought the apppointment was for 2:45 tomorrow (05/03/2019). Pt informed of her next scheduled appointment and that it is the last one approved by Medicaid at this time. Pt to call if she is unable to attend.   Willow Ora, PTA, [facility] [address], [place], [place] [address] [phone] 05/02/19, 2:56 PM

## 2019-05-02 NOTE — Telephone Encounter (Signed)
Prior approval for hydrocodone completed via [place] [employer].  Med approved for 05/02/2019 - 10/29/2019. Prior approval # [id number].   [facility] informed.  Pt informed.   Christen Bame, CMA

## 2019-05-02 NOTE — Telephone Encounter (Signed)
**  After Hours/ Emergency Line Call**  Received a call to report that [name] reporting "spider bit her thumb". Did not see the spider. Endorsing worsening swelling and pain and new bumps and redness.  Denying drainage, fevers, chills. Has not tried any ibuprofen or typenol. Recommended that OCT analgesics, washing with soap and water, keeping area clean/dry/covered with bandage, icing affected area. Red flags discussed. Patient to go to the ED or come to office if not improving. Will forward to PCP.      Bonnita Hollow, MD PGY-2, [facility] 05/02/2019 10:26 PM

## 2019-05-02 NOTE — Telephone Encounter (Signed)
Received prior authorization from [place] for Hydrocodone-Acetaminophen 10-325mg  tablets.   [id number]

## 2019-05-03 DIAGNOSIS — E1140 Type 2 diabetes mellitus with diabetic neuropathy, unspecified: Secondary | ICD-10-CM | POA: Diagnosis not present

## 2019-05-04 DIAGNOSIS — E1140 Type 2 diabetes mellitus with diabetic neuropathy, unspecified: Secondary | ICD-10-CM | POA: Diagnosis not present

## 2019-05-05 DIAGNOSIS — E1140 Type 2 diabetes mellitus with diabetic neuropathy, unspecified: Secondary | ICD-10-CM | POA: Diagnosis not present

## 2019-05-06 DIAGNOSIS — E1140 Type 2 diabetes mellitus with diabetic neuropathy, unspecified: Secondary | ICD-10-CM | POA: Diagnosis not present

## 2019-05-07 DIAGNOSIS — E1140 Type 2 diabetes mellitus with diabetic neuropathy, unspecified: Secondary | ICD-10-CM | POA: Diagnosis not present

## 2019-05-08 ENCOUNTER — Ambulatory Visit: Payer: Medicaid Other | Attending: Family Medicine | Admitting: Physical Therapy

## 2019-05-08 ENCOUNTER — Other Ambulatory Visit: Payer: Self-pay

## 2019-05-08 ENCOUNTER — Encounter (HOSPITAL_COMMUNITY): Payer: Self-pay

## 2019-05-08 ENCOUNTER — Emergency Department (HOSPITAL_COMMUNITY)
Admission: EM | Admit: 2019-05-08 | Discharge: 2019-05-08 | Disposition: A | Discharge status: Home / Self Care | Payer: Medicaid Other | Attending: Emergency Medicine | Admitting: Emergency Medicine

## 2019-05-08 DIAGNOSIS — R739 Hyperglycemia, unspecified: Secondary | ICD-10-CM

## 2019-05-08 DIAGNOSIS — Z87891 Personal history of nicotine dependence: Secondary | ICD-10-CM | POA: Diagnosis not present

## 2019-05-08 DIAGNOSIS — Z79899 Other long term (current) drug therapy: Secondary | ICD-10-CM | POA: Insufficient documentation

## 2019-05-08 DIAGNOSIS — R51 Headache: Secondary | ICD-10-CM | POA: Diagnosis present

## 2019-05-08 DIAGNOSIS — E1151 Type 2 diabetes mellitus with diabetic peripheral angiopathy without gangrene: Secondary | ICD-10-CM | POA: Diagnosis not present

## 2019-05-08 DIAGNOSIS — E1165 Type 2 diabetes mellitus with hyperglycemia: Secondary | ICD-10-CM | POA: Diagnosis not present

## 2019-05-08 DIAGNOSIS — Z89512 Acquired absence of left leg below knee: Secondary | ICD-10-CM | POA: Diagnosis not present

## 2019-05-08 DIAGNOSIS — Z794 Long term (current) use of insulin: Secondary | ICD-10-CM | POA: Insufficient documentation

## 2019-05-08 DIAGNOSIS — I10 Essential (primary) hypertension: Secondary | ICD-10-CM | POA: Diagnosis not present

## 2019-05-08 DIAGNOSIS — J449 Chronic obstructive pulmonary disease, unspecified: Secondary | ICD-10-CM | POA: Diagnosis not present

## 2019-05-08 DIAGNOSIS — E1142 Type 2 diabetes mellitus with diabetic polyneuropathy: Secondary | ICD-10-CM | POA: Diagnosis not present

## 2019-05-08 DIAGNOSIS — N390 Urinary tract infection, site not specified: Secondary | ICD-10-CM | POA: Insufficient documentation

## 2019-05-08 DIAGNOSIS — E1140 Type 2 diabetes mellitus with diabetic neuropathy, unspecified: Secondary | ICD-10-CM | POA: Diagnosis not present

## 2019-05-08 DIAGNOSIS — J45909 Unspecified asthma, uncomplicated: Secondary | ICD-10-CM | POA: Diagnosis not present

## 2019-05-08 LAB — COMPREHENSIVE METABOLIC PANEL
ALT: 14 U/L (ref 0–44)
AST: 13 U/L — ABNORMAL LOW (ref 15–41)
Albumin: 2.7 g/dL — ABNORMAL LOW (ref 3.5–5.0)
Alkaline Phosphatase: 193 U/L — ABNORMAL HIGH (ref 38–126)
Anion gap: 11 (ref 5–15)
BUN: 17 mg/dL (ref 6–20)
CO2: 23 mmol/L (ref 22–32)
Calcium: 8.6 mg/dL — ABNORMAL LOW (ref 8.9–10.3)
Chloride: 93 mmol/L — ABNORMAL LOW (ref 98–111)
Creatinine, Ser: 1.06 mg/dL — ABNORMAL HIGH (ref 0.44–1.00)
GFR calc Af Amer: >60 mL/min (ref >60)
GFR calc non Af Amer: >60 mL/min (ref >60)
Glucose, Bld: 581 mg/dL (ref 70–99)
Potassium: 5.2 mmol/L — ABNORMAL HIGH (ref 3.5–5.1)
Sodium: 127 mmol/L — ABNORMAL LOW (ref 135–145)
Total Bilirubin: 0.4 mg/dL (ref 0.3–1.2)
Total Protein: 8.2 g/dL — ABNORMAL HIGH (ref 6.5–8.1)

## 2019-05-08 LAB — BLOOD GAS, VENOUS
Acid-base deficit: 0.4 mmol/L (ref 0.0–2.0)
Bicarbonate: 24.5 mmol/L (ref 20.0–28.0)
O2 Saturation: 40.6 %
Patient temperature: 98.6
pCO2, Ven: 43.9 mmHg — ABNORMAL LOW (ref 44.0–60.0)
pH, Ven: 7.365 (ref 7.250–7.430)

## 2019-05-08 LAB — URINALYSIS, ROUTINE W REFLEX MICROSCOPIC
Bilirubin Urine: NEGATIVE
Glucose, UA: >=500 mg/dL — AB
Ketones, ur: NEGATIVE mg/dL
Nitrite: NEGATIVE
Protein, ur: 30 mg/dL — AB
Specific Gravity, Urine: 1.022 (ref 1.005–1.030)
pH: 6 (ref 5.0–8.0)

## 2019-05-08 LAB — CBC
HCT: 33.2 % — ABNORMAL LOW (ref 36.0–46.0)
Hemoglobin: 10.9 g/dL — ABNORMAL LOW (ref 12.0–15.0)
MCH: 29.9 pg (ref 26.0–34.0)
MCHC: 32.8 g/dL (ref 30.0–36.0)
MCV: 91 fL (ref 80.0–100.0)
Platelets: 357 10*3/uL (ref 150–400)
RBC: 3.65 MIL/uL — ABNORMAL LOW (ref 3.87–5.11)
RDW: 13.9 % (ref 11.5–15.5)
WBC: 16.6 10*3/uL — ABNORMAL HIGH (ref 4.0–10.5)
nRBC: 0 % (ref 0.0–0.2)

## 2019-05-08 LAB — CBG MONITORING, ED: Glucose-Capillary: 466 mg/dL — ABNORMAL HIGH (ref 70–99)

## 2019-05-08 LAB — LIPASE, BLOOD: Lipase: 24 U/L (ref 11–51)

## 2019-05-08 MED ORDER — ACETAMINOPHEN 325 MG PO TABS
650.0000 mg | ORAL_TABLET | Freq: Once | ORAL | Status: AC
  Administered 2019-05-08: 20:00:00 650 mg via ORAL
  Filled 2019-05-08: qty 2

## 2019-05-08 MED ORDER — SODIUM CHLORIDE 0.9 % IV SOLN
1000.0000 mL | INTRAVENOUS | Status: DC
  Administered 2019-05-08: 22:00:00 1000 mL via INTRAVENOUS

## 2019-05-08 MED ORDER — SODIUM CHLORIDE 0.9 % IV SOLN
1.0000 g | Freq: Once | INTRAVENOUS | Status: AC
  Administered 2019-05-08: 21:00:00 1 g via INTRAVENOUS
  Filled 2019-05-08: qty 10

## 2019-05-08 MED ORDER — IBUPROFEN 200 MG PO TABS
400.0000 mg | ORAL_TABLET | Freq: Once | ORAL | Status: AC
  Administered 2019-05-08: 21:00:00 400 mg via ORAL
  Filled 2019-05-08: qty 2

## 2019-05-08 MED ORDER — SULFAMETHOXAZOLE-TRIMETHOPRIM 800-160 MG PO TABS: 1.0000 | ORAL_TABLET | Freq: Two times a day (BID) | ORAL | 0 refills | Status: AC

## 2019-05-08 MED ORDER — INSULIN ASPART 100 UNIT/ML ~~LOC~~ SOLN
10.0000 [IU] | Freq: Once | SUBCUTANEOUS | Status: AC
  Administered 2019-05-08: 10 [IU] via INTRAVENOUS
  Filled 2019-05-08: qty 1

## 2019-05-08 MED ORDER — SODIUM CHLORIDE 0.9 % IV BOLUS (SEPSIS)
1000.0000 mL | Freq: Once | INTRAVENOUS | Status: AC
  Administered 2019-05-08: 20:00:00 1000 mL via INTRAVENOUS

## 2019-05-08 NOTE — ED Notes (Addendum)
Date and time results received: 05/08/19 2120  Test: Glucose Critical Value: 581  Name of Provider Notified: Dr. Vanita Panda  Orders Received? Or Actions Taken?: see orders

## 2019-05-08 NOTE — ED Provider Notes (Signed)
11:12 PM Patient asks, "can I go home now." Glucose is decreasing following IVF, insulin. ABX will continue. Patient does have hyperglycemia, but no substantial anion gap, noted ketone urea, and given this improvement she was discharged in stable condition.   Carmin Muskrat, MD 05/08/19 2314

## 2019-05-08 NOTE — ED Notes (Signed)
Pt given ice water x3

## 2019-05-08 NOTE — ED Notes (Signed)
Pt given ice water x2

## 2019-05-08 NOTE — ED Triage Notes (Signed)
Per EMS, Pt is complaining of H/A, chills no fever noted, and general malaise since 10pm last night. Pt called EMS due to continuation of symptoms.

## 2019-05-08 NOTE — ED Provider Notes (Signed)
[facility] DEPT Provider Note   CSN: [id number] Arrival date & time: 05/08/19  1856    History   Chief Complaint Chief Complaint  Patient presents with  . Headache  . Generalized Body Aches    HPI [name] is a 43 y.o. female.     HPI Patient presents emergency room for evaluation of nausea, diarrhea and diffuse body aches.  Patient has history of multiple medical problems including diabetes mellitus and chronic pain syndrome.  Patient states she started having nausea associated with diarrhea over the last few days.  She has not had any vomiting but does not have much of an appetite.  She denies any abdominal pain and has had 3-4 episodes of diarrhea today.  Patient has diffuse myalgias and body aches.  She denies any fevers.  She denies any cough or shortness of breath.  Patient also has a headache.  And she also noticed a rash on her finger a few days ago. Past Medical History:  Diagnosis Date  . Acute osteomyelitis of ankle or foot, left ([place]) 01/31/2018  . Alveolar hypoventilation   . Anemia    not on iron pill  . Asthma   . Bipolar 2 disorder ([place])   . Carpal tunnel syndrome on right    recurrent  . Cellulitis 08/2010-08/2011  . Chronic pain   . COPD (chronic obstructive pulmonary disease) ([name])    Symbicort daily and Proventil as needed  . Costochondritis   . Depression   . Diabetes mellitus type II, uncontrolled ([place]) 2000   Type 2, Uncontrolled.Takes Lantus daily.Fasting blood sugar runs 150  . Dizziness    occasionally  . Drug-seeking behavior   . GERD (gastroesophageal reflux disease)    takes Pantoprazole and Zantac daily  . Headache    migraine-last one about a yr ago.Topamax daily  . HLD (hyperlipidemia)    takes Atorvastatin daily  . Hypertension    takes Lisinopril and Coreg daily  . Morbid obesity ([place])   . Muscle spasm    takes Flexeril as needed  . Nocturia   . OSA on CPAP   . Peripheral neuropathy    takes  Gabapentin daily  . Pneumonia    "walking" several yrs ago and as a baby (12/05/2018)  . Rectal fissure   . Restless leg   . Urinary frequency   . Varicose veins    Right medial thigh and Left leg     Patient Active Problem List   Diagnosis Date Noted  . Amputated toe, right ([place]) 04/11/2019  . 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[place]) 04/11/2019  . Hypertriglyceridemia 04/11/2019  . Type 2 diabetes mellitus with diabetic polyneuropathy, with long-term current use of insulin ([place]) 04/11/2019  . Type 2 diabetes mellitus with hyperglycemia, with long-term current use of insulin ([place]) 04/11/2019  . Infection of right foot   . Osteomyelitis ([place]) 12/05/2018  . Cellulitis of great toe of right foot 10/29/2018  . Osteomyelitis of great toe of right foot ([place]) 10/29/2018  . Vulvovaginitis 10/23/2018  . Encounter for orthopedic aftercare following surgical amputation 06/28/2018  . Uncontrolled type 2 diabetes mellitus with insulin therapy ([place]) 04/16/2018  . Amputation stump infection ([place]) 04/09/2018  . Allergic rhinitis 03/06/2018  . Unilateral complete BKA, left, sequela ([place])   . Class 3 severe obesity due to excess calories with serious comorbidity and body mass index (BMI) of 50.0 to 59.9 in adult [facility])   . Decreased pedal pulses 06/10/2017  . Unilateral primary osteoarthritis,  right knee 10/22/2016  . Primary osteoarthritis of first carpometacarpal joint of left hand 07/30/2016  . Diabetic neuropathy ([name]) 07/14/2016  . Syncope 02/25/2016  . De Quervain's tenosynovitis, bilateral 11/01/2015  . Vitamin D deficiency 09/05/2015  . Recurrent candidiasis of vagina 09/05/2015  . Varicose veins of leg with complications 06/11/2015  . Restless leg syndrome 10/17/2014  . Chronic sinusitis 07/18/2014  . Headache 07/15/2014  . Urge incontinence 10/15/2013  . Encounter for chronic pain management 06/30/2013  . HLD (hyperlipidemia) 11/19/2012  . Chest pain 06/27/2012  . Right carpal tunnel syndrome  09/01/2011  . Bilateral knee pain 09/01/2011  . DM (diabetes mellitus) type II uncontrolled, periph vascular disorder ([name]) 05/22/2008  . Morbid obesity ([name]) 05/22/2008  . 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 Depression 05/22/2008  . Obstructive sleep apnea 05/22/2008  . Hypertension 05/22/2008  . Asthma 05/22/2008  . GERD 05/22/2008    Past Surgical History:  Procedure Laterality Date  . AMPUTATION Left 02/01/2018   Procedure: LEFT FOURTH AND 5TH TOE RAY AMPUTATION;  Surgeon: Duda, Marcus V, MD;  Location: [facility] OR;  Service: Orthopedics;  Laterality: Left;  . AMPUTATION Left 03/03/2018   Procedure: LEFT BELOW KNEE AMPUTATION;  Surgeon: Duda, Marcus V, MD;  Location: [facility] OR;  Service: Orthopedics;  Laterality: Left;  . CARPAL TUNNEL RELEASE Bilateral   . CESAREAN SECTION  2007  . KNEE ARTHROSCOPY Right 07/17/2010  . LEFT HEART CATHETERIZATION WITH CORONARY ANGIOGRAM N/A 07/27/2012   Procedure: LEFT HEART CATHETERIZATION WITH CORONARY ANGIOGRAM;  Surgeon: Michael Cooper, MD;  Location: [facility] CATH LAB;  Service: Cardiovascular;  Laterality: N/A;  . MASS EXCISION N/A 06/29/2013   Procedure:  WIDE LOCAL EXCISION OF POSTERIOR NECK ABSCESS;  Surgeon: Armando Ramirez, MD;  Location: [facility] OR;  Service: General;  Laterality: N/A;  . REPAIR KNEE LIGAMENT Left    "fixed ligaments and chipped patella"     OB History    Gravida  2   Para  1   Term      Preterm      AB  1   Living  1     SAB  1   TAB      Ectopic      Multiple      Live Births               Home Medications    Prior to Admission medications   Medication Sig Start Date End Date Taking? Authorizing Provider  ACCU-CHEK SOFTCLIX LANCETS lancets Use as instructed to check blood sugar 3 times per day Patient taking differently: Use as instructed to check blood sugar 2 times per day 03/16/18   Angiulli, Daniel J, PA-C  albuterol (VENTOLIN HFA) 108 (90 Base) MCG/ACT inhaler Inhale 2 puffs into the lungs every  4 (four) hours as needed for wheezing or shortness of breath. 03/27/19   Horton, Courtney F, MD  atorvastatin (LIPITOR) 20 MG tablet Take 1 tablet (20 mg total) by mouth daily. 03/16/18   Angiulli, Daniel J, PA-C  Blood Glucose Monitoring Suppl (ACCU-CHEK NANO SMARTVIEW) w/Device KIT Use to check blood sugar 3 times daily 03/16/18   Angiulli, Daniel J, PA-C  budesonide-formoterol (SYMBICORT) 160-4.5 MCG/ACT inhaler inhale 2 PUFFS into THE lungs 2 TIMES DAILY Patient taking differently: Inhale 2 puffs into the lungs 2 (two) times daily.  10/10/18   Hensel, William A, MD  Cholecalciferol 1000 units tablet Take 1 tablet (1,000 Units total) by mouth daily. 04/05/17   McIntyre, Brittany J, MD    cyclobenzaprine (FLEXERIL) 5 MG tablet Take 1 tablet (5 mg total) by mouth 3 (three) times daily as needed for muscle spasms. 04/19/19   McIntyre, Brittany J, MD  gabapentin (NEURONTIN) 600 MG tablet TAKE 2 TABLETS BY MOUTH 3 TIMES DAILY 04/26/19   McIntyre, Brittany J, MD  glucose blood (ACCU-CHEK GUIDE) test strip USE T0 CHECK BLOOD SUGAR 3 TIMES DAILY 03/16/18   Angiulli, Daniel J, PA-C  HYDROcodone-acetaminophen (NORCO) 10-325 MG tablet Take 1 tablet by mouth every 8 (eight) hours as needed. 04/19/19   McIntyre, Brittany J, MD  HYDROcodone-acetaminophen (NORCO) 10-325 MG tablet Take 1 tablet by mouth every 8 (eight) hours as needed. 04/19/19   McIntyre, Brittany J, MD  HYDROcodone-acetaminophen (NORCO) 10-325 MG tablet Take 1 tablet by mouth every 8 (eight) hours as needed. 04/19/19   McIntyre, Brittany J, MD  Insulin Isophane & Regular Human (HUMULIN 70/30 MIX) (70-30) 100 UNIT/ML PEN Inject 70 Units into the skin daily with breakfast AND 35 Units daily with supper. 04/11/19   Shamleffer, Ibtehal Jaralla, MD  pantoprazole (PROTONIX) 40 MG tablet Take 1 tablet (40 mg total) by mouth daily. 03/16/18   Angiulli, Daniel J, PA-C    Family History Family History  Problem Relation Age of Onset  . Diabetes Mother   .  Hyperlipidemia Mother   . Depression Mother   . Varicose Veins Mother   . Heart attack Paternal Uncle   . Heart disease Paternal Grandmother   . Heart attack Paternal Grandmother   . Heart attack Paternal Grandfather   . Heart disease Paternal Grandfather   . Heart attack Father   . Cancer Maternal Grandmother        COLON  . Hypertension Maternal Grandmother   . Hyperlipidemia Maternal Grandmother   . Diabetes Maternal Grandmother   . Other Maternal Grandfather        GUN SHOT    Social History Social History   Tobacco Use  . Smoking status: Former Smoker    Packs/day: 0.30    Years: 0.30    Pack years: 0.09    Types: Cigarettes    Last attempt to quit: 12/06/1993    Years since quitting: 25.4  . Smokeless tobacco: Never Used  Substance Use Topics  . Alcohol use: No    Alcohol/week: 0.0 standard drinks  . Drug use: Yes    Comment: OD attempts on home meds       Allergies   Kiwi extract; Cefepime; Trental [pentoxifylline]; and Nubain [nalbuphine hcl]   Review of Systems Review of Systems  All other systems reviewed and are negative.    Physical Exam Updated Vital Signs BP (!) 122/56   Pulse 97   Temp 98.5 F (36.9 C) (Oral)   Resp 15   Ht 1.575 m (5' 2")   SpO2 99%   BMI 55.42 kg/m   Physical Exam Vitals signs and nursing note reviewed.  Constitutional:      General: She is not in acute distress.    Appearance: She is well-developed. She is ill-appearing.     Comments: Morbidly obese  HENT:     Head: Normocephalic and atraumatic.     Right Ear: External ear normal.     Left Ear: External ear normal.  Eyes:     General: No scleral icterus.       Right eye: No discharge.        Left eye: No discharge.     Conjunctiva/sclera: Conjunctivae normal.  Neck:       Musculoskeletal: Neck supple.     Trachea: No tracheal deviation.  Cardiovascular:     Rate and Rhythm: Normal rate and regular rhythm.  Pulmonary:     Effort: Pulmonary effort is normal.  No respiratory distress.     Breath sounds: Normal breath sounds. No stridor. No wheezing or rales.  Abdominal:     General: Bowel sounds are normal. There is no distension.     Palpations: Abdomen is soft.     Tenderness: There is no abdominal tenderness. There is no guarding or rebound.  Musculoskeletal:        General: No tenderness.  Skin:    General: Skin is warm and dry.     Findings: Rash present.     Comments: Vesicular type rash noted on the finger of her right hand, most suggestive of a herpetic whitlow  Neurological:     Mental Status: She is alert.     Cranial Nerves: No cranial nerve deficit (no facial droop, extraocular movements intact, no slurred speech).     Sensory: No sensory deficit.     Motor: No abnormal muscle tone or seizure activity.     Coordination: Coordination normal.      ED Treatments / Results  Labs (all labs ordered are listed, but only abnormal results are displayed) Labs Reviewed  BLOOD GAS, VENOUS  CBC  COMPREHENSIVE METABOLIC PANEL  LIPASE, BLOOD  URINALYSIS, ROUTINE W REFLEX MICROSCOPIC    EKG None  Radiology No results found.  Procedures Procedures (including critical care time)  Medications Ordered in ED Medications  sodium chloride 0.9 % bolus 1,000 mL (has no administration in time range)    Followed by  0.9 %  sodium chloride infusion (has no administration in time range)  acetaminophen (TYLENOL) tablet 650 mg (has no administration in time range)     Initial Impression / Assessment and Plan / ED Course  I have reviewed the triage vital signs and the nursing notes.  Pertinent labs & imaging results that were available during my care of the patient were reviewed by me and considered in my medical decision making (see chart for details).   Pt presented to the ED with diarrhea, body aches.  IV fluids, antiemetics ordered. Labs pending at time of shift change.  Care turned over to Dr Lockwood   Final Clinical  Impressions(s) / ED Diagnoses  pending   , , MD 05/11/19 0712  

## 2019-05-08 NOTE — ED Notes (Signed)
Pt asked again to provide urine specimen but pt unable to at this time. Pt has call bell and room phone within reach and knows to call staff if assistance is needed.

## 2019-05-08 NOTE — ED Triage Notes (Signed)
Pt complaining of shortness of breath with exertion.

## 2019-05-08 NOTE — ED Notes (Signed)
Pt informed a urine specimen is needed and knows to call staff if they are able to provide a urine specimen

## 2019-05-08 NOTE — ED Notes (Signed)
Bed: RJ73 Expected date:  Expected time:  Means of arrival:  Comments: EMS-migraine/behavioral

## 2019-05-08 NOTE — Discharge Instructions (Addendum)
Please be sure to follow-up with your physician, take all medication as directed and return here for concerning changes in your condition.

## 2019-05-09 ENCOUNTER — Other Ambulatory Visit: Payer: Self-pay | Admitting: Family Medicine

## 2019-05-09 ENCOUNTER — Telehealth (INDEPENDENT_AMBULATORY_CARE_PROVIDER_SITE_OTHER): Payer: Medicaid Other | Admitting: Family Medicine

## 2019-05-09 DIAGNOSIS — E1140 Type 2 diabetes mellitus with diabetic neuropathy, unspecified: Secondary | ICD-10-CM | POA: Diagnosis not present

## 2019-05-09 DIAGNOSIS — R110 Nausea: Secondary | ICD-10-CM | POA: Diagnosis not present

## 2019-05-09 LAB — URINE CULTURE

## 2019-05-09 NOTE — Progress Notes (Signed)
[facility] Telemedicine Visit  Patient consented to have virtual visit. Method of visit: Video  Encounter participants: Patient: [name] - located at home Provider: Rory Percy - located at [facility] Others (if applicable): N/A  Chief Complaint: nausea, body aches  HPI:  Patient endorses severe body aches, chills, and nausea with eating for the past 2 days.  She also states she has had diarrhea which she characterized as loose stools for the past few days although has not had a bowel movement today.  She went to the ED last night and was diagnosed with a UTI and prescribed antibiotics although she has not picked this up yet.  She is tolerating liquids but gets nauseous every time she eats although denies vomiting.  She is drinking plenty of fluids.  She has noticed her urine has been cloudy since this morning.  She denies fevers, abdominal pain, rashes, vision changes, cough, shortness of breath, dysuria, urgency, blood in urine or stool.  Denies sick contacts or known exposure to [place].  She ate out on Saturday but denies any new foods or raw foods.  She does have a history of uncontrolled insulin-dependent type 2 diabetes, CBG in the ED was 581 which came down with IV fluids.  She attributes this to having drank apple juice before presentation.  She states her blood sugars have been between 110-150 for the past week.  She denies any missed doses of insulin.  ROS: per HPI  Pertinent PMHx: uncontrolled IDT2DM with left BKA 2/2 OM, morbid obesity, depression, hyperlipidemia, OHS, RLS, chronic pain  Exam:  Gen: alert and oriented, in NAD Respiratory: speaks in full sentences, no resp distress, normal WOB  Assessment/Plan:  Nausea Unclear but likely multifactorial etiology.  Few day history of nausea with body aches, chills.  No fevers.  Benign abdominal exam in ED.  UTI evidenced on UA in ED last night and has not started antibiotic treatment yet.  Also with  hyperglycemia with CBG 581 and glucose on UA so likely contributing.  Viral gastroenteritis also possible considering history of loose stools and brief duration although is not having vomiting.  Could have diabetic gastroparesis, appears this has been considered in the past although patient is on chronic opioids and will be difficult to obtain a gastric emptying study, would defer to PCP.  No fevers, respiratory symptoms, or known contact that would concern for COVID.  Recommended patient pick up antibiotic from pharmacy for UTI and drink plenty of fluids as well as maintain compliance with insulin regimen, likely would benefit from follow-up with endocrinologist as she is due for A1c.  Strict return precautions given if patient is no better or develops worsening or new symptoms to come into the clinic to be seen.     Time spent during visit with patient: 14 minutes

## 2019-05-09 NOTE — Assessment & Plan Note (Addendum)
Unclear but likely multifactorial etiology.  Few day history of nausea with body aches, chills.  No fevers.  Benign abdominal exam in ED.  UTI evidenced on UA in ED last night and has not started antibiotic treatment yet.  Also with hyperglycemia with CBG 581 and glucose on UA so likely contributing.  Viral gastroenteritis also possible considering history of loose stools and brief duration although is not having vomiting.  Could have diabetic gastroparesis, appears this has been considered in the past although patient is on chronic opioids and will be difficult to obtain a gastric emptying study, would defer to PCP.  No fevers, respiratory symptoms, or known contact that would concern for COVID.  Recommended patient pick up antibiotic from pharmacy for UTI and drink plenty of fluids as well as maintain compliance with insulin regimen, likely would benefit from follow-up with endocrinologist as she is due for A1c.  Strict return precautions given if patient is no better or develops worsening or new symptoms to come into the clinic to be seen.

## 2019-05-10 ENCOUNTER — Other Ambulatory Visit: Payer: Self-pay | Admitting: Family Medicine

## 2019-05-10 ENCOUNTER — Telehealth: Payer: Self-pay | Admitting: Family Medicine

## 2019-05-10 ENCOUNTER — Ambulatory Visit: Payer: Medicaid Other | Admitting: Physical Therapy

## 2019-05-10 DIAGNOSIS — E1140 Type 2 diabetes mellitus with diabetic neuropathy, unspecified: Secondary | ICD-10-CM | POA: Diagnosis not present

## 2019-05-10 NOTE — Telephone Encounter (Signed)
**  After Hours/ Emergency Line Call**  Received a call to report that [name] chills, fever up to 100.41F, nausea.  Endorsing recently being diagnosed w/ UTI. This was confirmed in chart with UA consistent with UTI in ED on 06/02. Denying vomiting. Patient was prescribed antibiotics and has been taking them for 1 day without improvement in symptoms. Patient also had telemedicine visit yesterday for similar symptoms. She was recommended at that time to start her antibiotics and to follow up if no improvement. I recommended that patient go to ED or come to clinic tomorrow to be seen in person given no resolution in symptoms.  Red flags discussed.  Will forward to PCP.      Bonnita Hollow, MD PGY-2, [facility] 05/10/2019 5:34 PM

## 2019-05-11 ENCOUNTER — Other Ambulatory Visit: Payer: Self-pay

## 2019-05-11 ENCOUNTER — Telehealth: Payer: Self-pay | Admitting: *Deleted

## 2019-05-11 ENCOUNTER — Encounter: Payer: Self-pay | Admitting: Family Medicine

## 2019-05-11 ENCOUNTER — Telehealth (INDEPENDENT_AMBULATORY_CARE_PROVIDER_SITE_OTHER): Payer: Medicaid Other | Admitting: Family Medicine

## 2019-05-11 DIAGNOSIS — Z20822 Contact with and (suspected) exposure to covid-19: Secondary | ICD-10-CM

## 2019-05-11 DIAGNOSIS — R6889 Other general symptoms and signs: Secondary | ICD-10-CM

## 2019-05-11 DIAGNOSIS — E1140 Type 2 diabetes mellitus with diabetic neuropathy, unspecified: Secondary | ICD-10-CM | POA: Diagnosis not present

## 2019-05-11 NOTE — Assessment & Plan Note (Signed)
Patient with symptoms of body aches, fever, history of nausea, vomiting and diarrhea.  No increased shortness of breath or need for albuterol.  Patient is concerned that she may have COVID-19 although she has no known contacts.  Will place staff message for pack to contact patient for outpatient testing.  Advised to quarantine herself, wash her hands.

## 2019-05-11 NOTE — Telephone Encounter (Signed)
Patient called and scheduled for testing at [facility] site on 05/11/19 at 3:45 pm. Pt advised to wear a mask and remain in car at appt. Pt verbalized understanding.

## 2019-05-11 NOTE — Progress Notes (Signed)
[facility] Telemedicine Visit  Patient consented to have virtual visit. Method of visit: Telephone  Encounter participants: Patient: [name] - located at home Provider: [place] Raeann Offner - located at [facility] Others (if applicable): n/a  Chief Complaint: fever, cough  HPI:  Patient reports that 3 days ago she was having chills with body aches.  She is also feeling nauseated and having some vomiting and diarrhea.  She went to the ED and was diagnosed with a UTI and given Bactrim.  Patient reports that since being in the ED she has had continued headache.  Her see body aches, nausea and diarrhea have improved.  She developed fevers last night along with some mild cough. She states she has not had fever today, but she is concerned she have covid. She has had no known contacts. She denies any SOB or increased need for her albuterol.   CBG's 175-180's. Eating makes her sick.  ROS: per HPI  Pertinent PMHx: COPD, T2DM  Exam:  Respiratory:talking in full sentences with coughing noted  Assessment/Plan:  Suspected Covid-19 Virus Infection Patient with symptoms of body aches, fever, history of nausea, vomiting and diarrhea.  No increased shortness of breath or need for albuterol.  Patient is concerned that she may have COVID-19 although she has no known contacts.  Will place staff message for pack to contact patient for outpatient testing.  Advised to quarantine herself, wash her hands.    Time spent during visit with patient: 11 minutes

## 2019-05-11 NOTE — Telephone Encounter (Signed)
-----   Message from [name], DO sent at 05/11/2019  1:47 PM EDT ----- Patient needs COVID-19 testing.

## 2019-05-12 ENCOUNTER — Encounter (HOSPITAL_COMMUNITY): Payer: Self-pay | Admitting: Emergency Medicine

## 2019-05-12 ENCOUNTER — Other Ambulatory Visit: Payer: Self-pay

## 2019-05-12 ENCOUNTER — Emergency Department (HOSPITAL_COMMUNITY)
Admission: EM | Admit: 2019-05-12 | Discharge: 2019-05-13 | Disposition: A | Discharge status: Home / Self Care | Payer: Medicaid Other | Attending: Emergency Medicine | Admitting: Emergency Medicine

## 2019-05-12 ENCOUNTER — Emergency Department (HOSPITAL_COMMUNITY): Payer: Medicaid Other

## 2019-05-12 DIAGNOSIS — Z794 Long term (current) use of insulin: Secondary | ICD-10-CM | POA: Insufficient documentation

## 2019-05-12 DIAGNOSIS — Z87891 Personal history of nicotine dependence: Secondary | ICD-10-CM | POA: Insufficient documentation

## 2019-05-12 DIAGNOSIS — I10 Essential (primary) hypertension: Secondary | ICD-10-CM | POA: Insufficient documentation

## 2019-05-12 DIAGNOSIS — R079 Chest pain, unspecified: Secondary | ICD-10-CM | POA: Diagnosis not present

## 2019-05-12 DIAGNOSIS — I959 Hypotension, unspecified: Secondary | ICD-10-CM | POA: Diagnosis not present

## 2019-05-12 DIAGNOSIS — J40 Bronchitis, not specified as acute or chronic: Secondary | ICD-10-CM | POA: Diagnosis not present

## 2019-05-12 DIAGNOSIS — R05 Cough: Secondary | ICD-10-CM | POA: Diagnosis not present

## 2019-05-12 DIAGNOSIS — E119 Type 2 diabetes mellitus without complications: Secondary | ICD-10-CM | POA: Diagnosis not present

## 2019-05-12 DIAGNOSIS — R0602 Shortness of breath: Secondary | ICD-10-CM | POA: Diagnosis not present

## 2019-05-12 DIAGNOSIS — Z79899 Other long term (current) drug therapy: Secondary | ICD-10-CM | POA: Diagnosis not present

## 2019-05-12 DIAGNOSIS — J449 Chronic obstructive pulmonary disease, unspecified: Secondary | ICD-10-CM | POA: Insufficient documentation

## 2019-05-12 DIAGNOSIS — E1140 Type 2 diabetes mellitus with diabetic neuropathy, unspecified: Secondary | ICD-10-CM | POA: Diagnosis not present

## 2019-05-12 DIAGNOSIS — E1165 Type 2 diabetes mellitus with hyperglycemia: Secondary | ICD-10-CM | POA: Diagnosis not present

## 2019-05-12 DIAGNOSIS — R069 Unspecified abnormalities of breathing: Secondary | ICD-10-CM | POA: Diagnosis not present

## 2019-05-12 MED ORDER — ALBUTEROL SULFATE HFA 108 (90 BASE) MCG/ACT IN AERS
8.0000 | INHALATION_SPRAY | Freq: Once | RESPIRATORY_TRACT | Status: AC
  Administered 2019-05-13: 8 via RESPIRATORY_TRACT
  Filled 2019-05-12: qty 6.7

## 2019-05-12 NOTE — ED Provider Notes (Signed)
[facility] DEPT Provider Note   CSN: [id number] Arrival date & time: 05/12/19  2201    History   Chief Complaint Chief Complaint  Patient presents with  . Cough  . Chest Pain  . Fever  . Shortness of Breath    HPI [name] is a 43 y.o. female.     Patient with history of poorly controlled T2DM with neuropathy, HLD, osteomyelitis, HTN, asthma and bronchitis presents with symptoms of body aches, SOB, low grade temperature, cough for the past 5 days. She has been seen by her PCP as well as in the ED for same and is here because she feels she is getting worse. She states she had a COVID test done yesterday but results are pending. She also reports she is out of her Albuterol and this may be contributing to her SOB.   The history is provided by the patient. No language interpreter was used.  Cough  Associated symptoms: chest pain, chills, fever, myalgias and shortness of breath   Associated symptoms: no rash   Chest Pain  Associated symptoms: cough, fever, nausea and shortness of breath   Associated symptoms: no abdominal pain and no vomiting   Fever  Associated symptoms: chest pain, chills, cough, myalgias and nausea   Associated symptoms: no rash and no vomiting   Shortness of Breath  Associated symptoms: chest pain, cough and fever   Associated symptoms: no abdominal pain, no rash and no vomiting     Past Medical History:  Diagnosis Date  . Acute osteomyelitis of ankle or foot, left ([place]) 01/31/2018  . Alveolar hypoventilation   . Anemia    not on iron pill  . Asthma   . Bipolar 2 disorder ([place])   . Carpal tunnel syndrome on right    recurrent  . Cellulitis 08/2010-08/2011  . Chronic pain   . COPD (chronic obstructive pulmonary disease) ([name])    Symbicort daily and Proventil as needed  . Costochondritis   . Depression   . Diabetes mellitus type II, uncontrolled ([place]) 2000   Type 2, Uncontrolled.Takes Lantus daily.Fasting blood sugar  runs 150  . Dizziness    occasionally  . Drug-seeking behavior   . GERD (gastroesophageal reflux disease)    takes Pantoprazole and Zantac daily  . Headache    migraine-last one about a yr ago.Topamax daily  . HLD (hyperlipidemia)    takes Atorvastatin daily  . Hypertension    takes Lisinopril and Coreg daily  . Morbid obesity ([place])   . Muscle spasm    takes Flexeril as needed  . Nocturia   . OSA on CPAP   . Peripheral neuropathy    takes Gabapentin daily  . Pneumonia    "walking" several yrs ago and as a baby (12/05/2018)  . Rectal fissure   . Restless leg   . Syncope 02/25/2016  . Urinary frequency   . Varicose veins    Right medial thigh and Left leg     Patient Active Problem List   Diagnosis Date Noted  . Suspected Covid-19 Virus Infection 05/11/2019  . Amputated toe, right ([place]) 04/11/2019  . 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[place]) 04/11/2019  . Hypertriglyceridemia 04/11/2019  . Type 2 diabetes mellitus with diabetic polyneuropathy, with long-term current use of insulin ([place]) 04/11/2019  . Type 2 diabetes mellitus with hyperglycemia, with long-term current use of insulin ([place]) 04/11/2019  . Infection of right foot   . Cellulitis of great toe of right foot 10/29/2018  .  Osteomyelitis of great toe of right foot ([place]) 10/29/2018  . Encounter for orthopedic aftercare following surgical amputation 06/28/2018  . Uncontrolled type 2 diabetes mellitus with insulin therapy ([place]) 04/16/2018  . Amputation stump infection ([place]) 04/09/2018  . Allergic rhinitis 03/06/2018  . Unilateral complete BKA, left, sequela ([place])   . Class 3 severe obesity due to excess calories with serious comorbidity and body mass index (BMI) of 50.0 to 59.9 in adult [facility])   . Decreased pedal pulses 06/10/2017  . Unilateral primary osteoarthritis, right knee 10/22/2016  . Primary osteoarthritis of first carpometacarpal joint of left hand 07/30/2016  . Diabetic neuropathy ([place]) 07/14/2016  . Nausea 03/17/2016   . De Quervain's tenosynovitis, bilateral 11/01/2015  . Vitamin D deficiency 09/05/2015  . Recurrent candidiasis of vagina 09/05/2015  . Varicose veins of leg with complications 41/66/0630  . Restless leg syndrome 10/17/2014  . Chronic sinusitis 07/18/2014  . Headache 07/15/2014  . Encounter for chronic pain management 06/30/2013  . HLD (hyperlipidemia) 11/19/2012  . Chest pain 06/27/2012  . Right carpal tunnel syndrome 09/01/2011  . Bilateral knee pain 09/01/2011  . DM (diabetes mellitus) type II uncontrolled, periph vascular disorder ([place]) 05/22/2008  . Morbid obesity ([place]) 05/22/2008  . 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 Depression 05/22/2008  . Obstructive sleep apnea 05/22/2008  . Hypertension 05/22/2008  . Asthma 05/22/2008  . GERD 05/22/2008    Past Surgical History:  Procedure Laterality Date  . AMPUTATION Left 02/01/2018   Procedure: LEFT FOURTH AND 5TH TOE RAY AMPUTATION;  Surgeon: [name], MD;  Location: [place];  Service: Orthopedics;  Laterality: Left;  . AMPUTATION Left 03/03/2018   Procedure: LEFT BELOW KNEE AMPUTATION;  Surgeon: [name], MD;  Location: [place];  Service: Orthopedics;  Laterality: Left;  . CARPAL TUNNEL RELEASE Bilateral   . CESAREAN SECTION  2007  . KNEE ARTHROSCOPY Right 07/17/2010  . LEFT HEART CATHETERIZATION WITH CORONARY ANGIOGRAM N/A 07/27/2012   Procedure: LEFT HEART CATHETERIZATION WITH CORONARY ANGIOGRAM;  Surgeon: Sherren Mocha, MD;  Location[facility];  Service: Cardiovascular;  Laterality: N/A;  . MASS EXCISION N/A 06/29/2013   Procedure:  WIDE LOCAL EXCISION OF POSTERIOR NECK ABSCESS;  Surgeon: Ralene Ok, MD;  Location: [place];  Service: General;  Laterality: N/A;  . REPAIR KNEE LIGAMENT Left    "fixed ligaments and chipped patella"     OB History    Gravida  2   Para  1   Term      Preterm      AB  1   Living  1     SAB  1   TAB      Ectopic      Multiple      Live Births                Home Medications    Prior to Admission medications   Medication Sig Start Date End Date Taking? Authorizing Provider  ACCU-CHEK SOFTCLIX LANCETS lancets Use as instructed to check blood sugar 3 times per day Patient taking differently: Use as instructed to check blood sugar 2 times per day 03/16/18   Angiulli, Lavon Paganini, PA-C  atorvastatin (LIPITOR) 20 MG tablet Take 1 tablet (20 mg total) by mouth daily. 03/16/18   Angiulli, Lavon Paganini, PA-C  Blood Glucose Monitoring Suppl (ACCU-CHEK NANO SMARTVIEW) w/Device KIT Use to check blood sugar 3 times daily 03/16/18   Angiulli, Lavon Paganini, PA-C  budesonide-formoterol [facility]) 160-4.5 MCG/ACT inhaler inhale  2 PUFFS into THE lungs 2 TIMES DAILY Patient taking differently: Inhale 2 puffs into the lungs 2 (two) times daily.  10/10/18   [name], MD  Cholecalciferol 1000 units tablet Take 1 tablet (1,000 Units total) by mouth daily. 04/05/17   [name], MD  cyclobenzaprine (FLEXERIL) 5 MG tablet TAKE 1 TABLET BY MOUTH 3 TIMES DAILY AS NEEDED FOR MUSCLE SPASMS 05/09/19   [name], MD  gabapentin (NEURONTIN) 600 MG tablet TAKE 2 TABLETS BY MOUTH 3 TIMES DAILY Patient taking differently: Take 1,200 mg by mouth 3 (three) times daily.  04/26/19   [name], MD  glucose blood (ACCU-CHEK GUIDE) test strip USE T0 CHECK BLOOD SUGAR 3 TIMES DAILY 03/16/18   Angiulli, Lavon Paganini, PA-C  HYDROcodone-acetaminophen (NORCO) 10-325 MG tablet Take 1 tablet by mouth every 8 (eight) hours as needed. 04/19/19   [name], MD  HYDROcodone-acetaminophen [facility]) 10-325 MG tablet Take 1 tablet by mouth every 8 (eight) hours as needed. Patient taking differently: Take 1 tablet by mouth every 8 (eight) hours.  04/19/19   [name], MD  HYDROcodone-acetaminophen [facility]) 10-325 MG tablet Take 1 tablet by mouth every 8 (eight) hours as needed. Patient not taking: Reported on 05/08/2019 04/19/19   [name], MD  Insulin  Isophane & Regular Human (HUMULIN 70/30 MIX) (70-30) 100 UNIT/ML PEN Inject 70 Units into the skin daily with breakfast AND 35 Units daily with supper. 04/11/19   Shamleffer, Melanie Crazier, MD  NYSTATIN powder Apply 1 Bottle topically 4 (four) times daily.  03/30/19   [provider]  pantoprazole (PROTONIX) 40 MG tablet Take 1 tablet (40 mg total) by mouth daily. 03/16/18   Angiulli, Lavon Paganini, PA-C  PROVENTIL HFA 108 (90 Base) MCG/ACT inhaler Inhale 1-2 puffs into the lungs every 6 (six) hours as needed for wheezing or shortness of breath. 05/11/19   [name], MD  sulfamethoxazole-trimethoprim (BACTRIM DS) 800-160 MG tablet Take 1 tablet by mouth 2 (two) times daily for 7 days. 05/08/19 05/15/19  Carmin Muskrat, MD    Family History Family History  Problem Relation Age of Onset  . Diabetes Mother   . Hyperlipidemia Mother   . Depression Mother   . Varicose Veins Mother   . Heart attack Paternal Uncle   . Heart disease Paternal Grandmother   . Heart attack Paternal Grandmother   . Heart attack Paternal Grandfather   . Heart disease Paternal Grandfather   . Heart attack Father   . Cancer Maternal Grandmother        COLON  . Hypertension Maternal Grandmother   . Hyperlipidemia Maternal Grandmother   . Diabetes Maternal Grandmother   . Other Maternal Grandfather        GUN SHOT    Social History Social History   Tobacco Use  . Smoking status: Former Smoker    Packs/day: 0.30    Years: 0.30    Pack years: 0.09    Types: Cigarettes    Last attempt to quit: 12/06/1993    Years since quitting: 25.4  . Smokeless tobacco: Never Used  Substance Use Topics  . Alcohol use: No    Alcohol/week: 0.0 standard drinks  . Drug use: Yes    Comment: OD attempts on home meds       Allergies   Kiwi extract; Cefepime; Trental [pentoxifylline]; and Nubain [nalbuphine hcl]   Review of Systems Review of Systems  Constitutional: Positive for chills and fever.  HENT: Negative.    Respiratory: Positive for cough and shortness of breath.   Cardiovascular: Positive for chest pain.  Gastrointestinal: Positive for nausea. Negative for abdominal pain and vomiting.  Musculoskeletal: Positive for myalgias.  Skin: Negative for rash.     Physical Exam Updated Vital Signs Temp (!) 97.3 F (36.3 C) (Oral)   Resp 20   Ht '5\' 2"'  (1.575 m)   BMI 55.42 kg/m   Physical Exam Constitutional:      Appearance: She is well-developed. She is obese.  HENT:     Head: Normocephalic.     Nose: Nose normal.     Mouth/Throat:     Mouth: Mucous membranes are dry.  Neck:     Musculoskeletal: Normal range of motion.  Cardiovascular:     Rate and Rhythm: Normal rate and regular rhythm.  Pulmonary:     Effort: Pulmonary effort is normal.     Breath sounds: No wheezing, rhonchi or rales.  Chest:     Chest wall: No tenderness.  Abdominal:     Tenderness: There is no abdominal tenderness.     Comments: Morbidly obese abdomen.  Musculoskeletal:     Comments: Bilateral LE amputations  Skin:    General: Skin is warm and dry.  Neurological:     General: No focal deficit present.     Mental Status: She is alert and oriented to person, place, and time.      ED Treatments / Results  Labs (all labs ordered are listed, but only abnormal results are displayed) Labs Reviewed - No data to display  EKG None  Radiology No results found.  Procedures Procedures (including critical care time)  Medications Ordered in ED Medications  albuterol (VENTOLIN HFA) 108 (90 Base) MCG/ACT inhaler 8 puff (has no administration in time range)     Initial Impression / Assessment and Plan / ED Course  I have reviewed the triage vital signs and the nursing notes.  Pertinent labs & imaging results that were available during my care of the patient were reviewed by me and considered in my medical decision making (see chart for details).        Patient to ED with complaint of persistent  symptoms over the last several days of body aches, cough, sob, low grade fever.   She is nontoxic in appearance. She reports being out of her inhaler and this is likely contributing to her sob.   CXR negative for infection. No hypoxia, tachycardia, tachypnea. Albuterol inhaler, 8 puffs, provided. On re-evaluation, the patient states she feels much better.   She is felt appropriate for discharge.   Final Clinical Impressions(s) / ED Diagnoses   Final diagnoses:  None   1. Bronchitis  ED Discharge Orders    None       Charlann Lange, PA-C 05/13/19 7341    Merryl Hacker, MD 05/14/19 [phone]

## 2019-05-12 NOTE — ED Triage Notes (Signed)
Patient presents with worsening shortness of breath, cough, chills, fever, body aches and chest pain with cough. She has tried tylenol and ibuprofen. Symptoms have worsened since Wednesday. On Friday she was tested for Covid 19, but does not have the results yet.   EMS vitals: 97.2 temp 123/52 BP 84 HR 20  Resp Rate 98% O2 on room air  409 CBG

## 2019-05-13 DIAGNOSIS — R0602 Shortness of breath: Secondary | ICD-10-CM | POA: Diagnosis not present

## 2019-05-13 DIAGNOSIS — E1140 Type 2 diabetes mellitus with diabetic neuropathy, unspecified: Secondary | ICD-10-CM | POA: Diagnosis not present

## 2019-05-13 DIAGNOSIS — R05 Cough: Secondary | ICD-10-CM | POA: Diagnosis not present

## 2019-05-13 NOTE — Discharge Instructions (Addendum)
Use the inhaler as usually dosed.   Follow up with your doctor for recheck of symptoms and to obtain the result of your COVID-19 test.

## 2019-05-14 DIAGNOSIS — E1140 Type 2 diabetes mellitus with diabetic neuropathy, unspecified: Secondary | ICD-10-CM | POA: Diagnosis not present

## 2019-05-14 LAB — NOVEL CORONAVIRUS, NAA: SARS-CoV-2, NAA: NOT DETECTED

## 2019-05-15 ENCOUNTER — Ambulatory Visit: Payer: Medicaid Other | Admitting: Physical Therapy

## 2019-05-15 DIAGNOSIS — E1140 Type 2 diabetes mellitus with diabetic neuropathy, unspecified: Secondary | ICD-10-CM | POA: Diagnosis not present

## 2019-05-16 ENCOUNTER — Telehealth: Payer: Self-pay | Admitting: Family Medicine

## 2019-05-16 DIAGNOSIS — E1140 Type 2 diabetes mellitus with diabetic neuropathy, unspecified: Secondary | ICD-10-CM | POA: Diagnosis not present

## 2019-05-16 NOTE — Telephone Encounter (Signed)
Note in chart stating that voicemail was left for patient regarding negative test results on 6/8.  Called patient and left another voicemail letting patient know that her test results are negative.  She is to call if she has any further questions or concerns.

## 2019-05-16 NOTE — Telephone Encounter (Signed)
Pt is calling to check on the status of receiving her results from her COVID testing.   Please call pt when results are available.

## 2019-05-17 ENCOUNTER — Ambulatory Visit: Payer: Medicaid Other | Admitting: Physical Therapy

## 2019-05-17 ENCOUNTER — Inpatient Hospital Stay (HOSPITAL_COMMUNITY): Payer: Medicaid Other

## 2019-05-17 ENCOUNTER — Encounter (HOSPITAL_COMMUNITY)
Admission: EM | Disposition: A | Discharge status: Home Health | Payer: Self-pay | Source: Home / Self Care | Attending: Family Medicine

## 2019-05-17 ENCOUNTER — Inpatient Hospital Stay (HOSPITAL_COMMUNITY): Payer: Medicaid Other | Admitting: Certified Registered Nurse Anesthetist

## 2019-05-17 ENCOUNTER — Other Ambulatory Visit: Payer: Self-pay

## 2019-05-17 ENCOUNTER — Encounter (HOSPITAL_COMMUNITY): Payer: Self-pay | Admitting: Emergency Medicine

## 2019-05-17 ENCOUNTER — Inpatient Hospital Stay (HOSPITAL_COMMUNITY)
Admission: EM | Admit: 2019-05-17 | Discharge: 2019-05-25 | DRG: 853 | Disposition: A | Discharge status: Home Health | Payer: Medicaid Other | Attending: Family Medicine | Admitting: Family Medicine

## 2019-05-17 DIAGNOSIS — E872 Acidosis: Secondary | ICD-10-CM | POA: Diagnosis not present

## 2019-05-17 DIAGNOSIS — N179 Acute kidney failure, unspecified: Secondary | ICD-10-CM | POA: Diagnosis not present

## 2019-05-17 DIAGNOSIS — Z6841 Body Mass Index (BMI) 40.0 and over, adult: Secondary | ICD-10-CM

## 2019-05-17 DIAGNOSIS — E1151 Type 2 diabetes mellitus with diabetic peripheral angiopathy without gangrene: Secondary | ICD-10-CM | POA: Diagnosis not present

## 2019-05-17 DIAGNOSIS — J45909 Unspecified asthma, uncomplicated: Secondary | ICD-10-CM

## 2019-05-17 DIAGNOSIS — R52 Pain, unspecified: Secondary | ICD-10-CM | POA: Diagnosis not present

## 2019-05-17 DIAGNOSIS — E871 Hypo-osmolality and hyponatremia: Secondary | ICD-10-CM | POA: Diagnosis not present

## 2019-05-17 DIAGNOSIS — R109 Unspecified abdominal pain: Secondary | ICD-10-CM | POA: Diagnosis not present

## 2019-05-17 DIAGNOSIS — E1129 Type 2 diabetes mellitus with other diabetic kidney complication: Secondary | ICD-10-CM | POA: Diagnosis not present

## 2019-05-17 DIAGNOSIS — L0291 Cutaneous abscess, unspecified: Secondary | ICD-10-CM | POA: Diagnosis not present

## 2019-05-17 DIAGNOSIS — Z8249 Family history of ischemic heart disease and other diseases of the circulatory system: Secondary | ICD-10-CM

## 2019-05-17 DIAGNOSIS — G9340 Encephalopathy, unspecified: Secondary | ICD-10-CM | POA: Diagnosis not present

## 2019-05-17 DIAGNOSIS — E785 Hyperlipidemia, unspecified: Secondary | ICD-10-CM | POA: Diagnosis present

## 2019-05-17 DIAGNOSIS — E1169 Type 2 diabetes mellitus with other specified complication: Secondary | ICD-10-CM | POA: Diagnosis not present

## 2019-05-17 DIAGNOSIS — L0231 Cutaneous abscess of buttock: Secondary | ICD-10-CM

## 2019-05-17 DIAGNOSIS — J449 Chronic obstructive pulmonary disease, unspecified: Secondary | ICD-10-CM | POA: Diagnosis present

## 2019-05-17 DIAGNOSIS — R739 Hyperglycemia, unspecified: Secondary | ICD-10-CM | POA: Insufficient documentation

## 2019-05-17 DIAGNOSIS — R509 Fever, unspecified: Secondary | ICD-10-CM

## 2019-05-17 DIAGNOSIS — Z1159 Encounter for screening for other viral diseases: Secondary | ICD-10-CM | POA: Diagnosis not present

## 2019-05-17 DIAGNOSIS — E662 Morbid (severe) obesity with alveolar hypoventilation: Secondary | ICD-10-CM | POA: Diagnosis present

## 2019-05-17 DIAGNOSIS — Z91018 Allergy to other foods: Secondary | ICD-10-CM

## 2019-05-17 DIAGNOSIS — Z794 Long term (current) use of insulin: Secondary | ICD-10-CM

## 2019-05-17 DIAGNOSIS — Z89421 Acquired absence of other right toe(s): Secondary | ICD-10-CM

## 2019-05-17 DIAGNOSIS — D62 Acute posthemorrhagic anemia: Secondary | ICD-10-CM | POA: Diagnosis not present

## 2019-05-17 DIAGNOSIS — I10 Essential (primary) hypertension: Secondary | ICD-10-CM | POA: Diagnosis not present

## 2019-05-17 DIAGNOSIS — Z888 Allergy status to other drugs, medicaments and biological substances status: Secondary | ICD-10-CM | POA: Diagnosis not present

## 2019-05-17 DIAGNOSIS — E1152 Type 2 diabetes mellitus with diabetic peripheral angiopathy with gangrene: Secondary | ICD-10-CM | POA: Diagnosis present

## 2019-05-17 DIAGNOSIS — B372 Candidiasis of skin and nail: Secondary | ICD-10-CM | POA: Diagnosis not present

## 2019-05-17 DIAGNOSIS — E1165 Type 2 diabetes mellitus with hyperglycemia: Secondary | ICD-10-CM | POA: Diagnosis not present

## 2019-05-17 DIAGNOSIS — F3181 Bipolar II disorder: Secondary | ICD-10-CM | POA: Diagnosis not present

## 2019-05-17 DIAGNOSIS — T508X5A Adverse effect of diagnostic agents, initial encounter: Secondary | ICD-10-CM | POA: Diagnosis not present

## 2019-05-17 DIAGNOSIS — I96 Gangrene, not elsewhere classified: Secondary | ICD-10-CM | POA: Diagnosis not present

## 2019-05-17 DIAGNOSIS — K219 Gastro-esophageal reflux disease without esophagitis: Secondary | ICD-10-CM | POA: Diagnosis present

## 2019-05-17 DIAGNOSIS — G4733 Obstructive sleep apnea (adult) (pediatric): Secondary | ICD-10-CM | POA: Diagnosis not present

## 2019-05-17 DIAGNOSIS — Z800 Family history of malignant neoplasm of digestive organs: Secondary | ICD-10-CM

## 2019-05-17 DIAGNOSIS — M1711 Unilateral primary osteoarthritis, right knee: Secondary | ICD-10-CM | POA: Diagnosis present

## 2019-05-17 DIAGNOSIS — A419 Sepsis, unspecified organism: Secondary | ICD-10-CM | POA: Diagnosis not present

## 2019-05-17 DIAGNOSIS — Z03818 Encounter for observation for suspected exposure to other biological agents ruled out: Secondary | ICD-10-CM | POA: Diagnosis not present

## 2019-05-17 DIAGNOSIS — Y92230 Patient room in hospital as the place of occurrence of the external cause: Secondary | ICD-10-CM | POA: Diagnosis not present

## 2019-05-17 DIAGNOSIS — L0390 Cellulitis, unspecified: Secondary | ICD-10-CM | POA: Diagnosis not present

## 2019-05-17 DIAGNOSIS — E1142 Type 2 diabetes mellitus with diabetic polyneuropathy: Secondary | ICD-10-CM | POA: Diagnosis present

## 2019-05-17 DIAGNOSIS — G2581 Restless legs syndrome: Secondary | ICD-10-CM | POA: Diagnosis present

## 2019-05-17 DIAGNOSIS — E119 Type 2 diabetes mellitus without complications: Secondary | ICD-10-CM | POA: Diagnosis not present

## 2019-05-17 DIAGNOSIS — Z79899 Other long term (current) drug therapy: Secondary | ICD-10-CM

## 2019-05-17 DIAGNOSIS — E781 Pure hyperglyceridemia: Secondary | ICD-10-CM | POA: Diagnosis present

## 2019-05-17 DIAGNOSIS — Z881 Allergy status to other antibiotic agents status: Secondary | ICD-10-CM | POA: Diagnosis not present

## 2019-05-17 DIAGNOSIS — Z87891 Personal history of nicotine dependence: Secondary | ICD-10-CM | POA: Diagnosis not present

## 2019-05-17 DIAGNOSIS — E43 Unspecified severe protein-calorie malnutrition: Secondary | ICD-10-CM | POA: Diagnosis present

## 2019-05-17 DIAGNOSIS — Z833 Family history of diabetes mellitus: Secondary | ICD-10-CM

## 2019-05-17 DIAGNOSIS — E1140 Type 2 diabetes mellitus with diabetic neuropathy, unspecified: Secondary | ICD-10-CM | POA: Diagnosis not present

## 2019-05-17 DIAGNOSIS — Z89512 Acquired absence of left leg below knee: Secondary | ICD-10-CM

## 2019-05-17 DIAGNOSIS — R5081 Fever presenting with conditions classified elsewhere: Secondary | ICD-10-CM | POA: Diagnosis not present

## 2019-05-17 DIAGNOSIS — Z8701 Personal history of pneumonia (recurrent): Secondary | ICD-10-CM

## 2019-05-17 DIAGNOSIS — L02211 Cutaneous abscess of abdominal wall: Secondary | ICD-10-CM | POA: Diagnosis not present

## 2019-05-17 DIAGNOSIS — Z8349 Family history of other endocrine, nutritional and metabolic diseases: Secondary | ICD-10-CM

## 2019-05-17 DIAGNOSIS — L02416 Cutaneous abscess of left lower limb: Secondary | ICD-10-CM | POA: Diagnosis not present

## 2019-05-17 DIAGNOSIS — G8929 Other chronic pain: Secondary | ICD-10-CM | POA: Diagnosis present

## 2019-05-17 HISTORY — PX: IRRIGATION AND DEBRIDEMENT BUTTOCKS: SHX6601

## 2019-05-17 LAB — COMPREHENSIVE METABOLIC PANEL
ALT: 14 U/L (ref 0–44)
AST: 10 U/L — ABNORMAL LOW (ref 15–41)
Albumin: 1.7 g/dL — ABNORMAL LOW (ref 3.5–5.0)
Alkaline Phosphatase: 291 U/L — ABNORMAL HIGH (ref 38–126)
Anion gap: 10 (ref 5–15)
BUN: 15 mg/dL (ref 6–20)
CO2: 22 mmol/L (ref 22–32)
Calcium: 8.6 mg/dL — ABNORMAL LOW (ref 8.9–10.3)
Chloride: 95 mmol/L — ABNORMAL LOW (ref 98–111)
Creatinine, Ser: 1.01 mg/dL — ABNORMAL HIGH (ref 0.44–1.00)
GFR calc Af Amer: >60 mL/min (ref >60)
GFR calc non Af Amer: >60 mL/min (ref >60)
Glucose, Bld: 588 mg/dL (ref 70–99)
Potassium: 4.5 mmol/L (ref 3.5–5.1)
Sodium: 127 mmol/L — ABNORMAL LOW (ref 135–145)
Total Bilirubin: 0.2 mg/dL — ABNORMAL LOW (ref 0.3–1.2)
Total Protein: 6.7 g/dL (ref 6.5–8.1)

## 2019-05-17 LAB — GLUCOSE, CAPILLARY
Glucose-Capillary: 290 mg/dL — ABNORMAL HIGH (ref 70–99)
Glucose-Capillary: 276 mg/dL — ABNORMAL HIGH (ref 70–99)
Glucose-Capillary: 233 mg/dL — ABNORMAL HIGH (ref 70–99)
Glucose-Capillary: 231 mg/dL — ABNORMAL HIGH (ref 70–99)
Glucose-Capillary: 258 mg/dL — ABNORMAL HIGH (ref 70–99)

## 2019-05-17 LAB — CBC WITH DIFFERENTIAL/PLATELET
Abs Immature Granulocytes: 0.5 10*3/uL — ABNORMAL HIGH (ref 0.00–0.07)
Abs Immature Granulocytes: 0 10*3/uL (ref 0.00–0.07)
Basophils Absolute: 0 10*3/uL (ref 0.0–0.1)
Basophils Absolute: 0 10*3/uL (ref 0.0–0.1)
Basophils Relative: 0 %
Basophils Relative: 0 %
Eosinophils Absolute: 0.2 10*3/uL (ref 0.0–0.5)
Eosinophils Absolute: 0.2 10*3/uL (ref 0.0–0.5)
Eosinophils Relative: 2 %
Eosinophils Relative: 1 %
HCT: 28.6 % — ABNORMAL LOW (ref 36.0–46.0)
HCT: 29.1 % — ABNORMAL LOW (ref 36.0–46.0)
Hemoglobin: 8.9 g/dL — ABNORMAL LOW (ref 12.0–15.0)
Hemoglobin: 9.3 g/dL — ABNORMAL LOW (ref 12.0–15.0)
Immature Granulocytes: 4 %
Lymphocytes Relative: 17 %
Lymphocytes Relative: 17 %
Lymphs Abs: 2.4 10*3/uL (ref 0.7–4.0)
Lymphs Abs: 2.8 10*3/uL (ref 0.7–4.0)
MCH: 28.3 pg (ref 26.0–34.0)
MCH: 29.2 pg (ref 26.0–34.0)
MCHC: 31.1 g/dL (ref 30.0–36.0)
MCHC: 32 g/dL (ref 30.0–36.0)
MCV: 91.1 fL (ref 80.0–100.0)
MCV: 91.5 fL (ref 80.0–100.0)
Monocytes Absolute: 1.2 10*3/uL — ABNORMAL HIGH (ref 0.1–1.0)
Monocytes Absolute: 1.8 10*3/uL — ABNORMAL HIGH (ref 0.1–1.0)
Monocytes Relative: 8 %
Monocytes Relative: 11 %
Neutro Abs: 10 10*3/uL — ABNORMAL HIGH (ref 1.7–7.7)
Neutro Abs: 11.5 10*3/uL — ABNORMAL HIGH (ref 1.7–7.7)
Neutrophils Relative %: 69 %
Neutrophils Relative %: 71 %
Platelets: 489 10*3/uL — ABNORMAL HIGH (ref 150–400)
Platelets: 482 10*3/uL — ABNORMAL HIGH (ref 150–400)
RBC: 3.14 MIL/uL — ABNORMAL LOW (ref 3.87–5.11)
RBC: 3.18 MIL/uL — ABNORMAL LOW (ref 3.87–5.11)
RDW: 14.3 % (ref 11.5–15.5)
RDW: 14.3 % (ref 11.5–15.5)
WBC: 14.4 10*3/uL — ABNORMAL HIGH (ref 4.0–10.5)
WBC: 16.2 10*3/uL — ABNORMAL HIGH (ref 4.0–10.5)
nRBC: 0 % (ref 0.0–0.2)
nRBC: 0 % (ref 0.0–0.2)
nRBC: 0 /100 WBC

## 2019-05-17 LAB — BASIC METABOLIC PANEL
Anion gap: 10 (ref 5–15)
BUN: 12 mg/dL (ref 6–20)
CO2: 21 mmol/L — ABNORMAL LOW (ref 22–32)
Calcium: 8.7 mg/dL — ABNORMAL LOW (ref 8.9–10.3)
Chloride: 102 mmol/L (ref 98–111)
Creatinine, Ser: 1.19 mg/dL — ABNORMAL HIGH (ref 0.44–1.00)
GFR calc Af Amer: >60 mL/min (ref >60)
GFR calc non Af Amer: 57 mL/min — ABNORMAL LOW (ref >60)
Glucose, Bld: 263 mg/dL — ABNORMAL HIGH (ref 70–99)
Potassium: 4.6 mmol/L (ref 3.5–5.1)
Sodium: 133 mmol/L — ABNORMAL LOW (ref 135–145)

## 2019-05-17 LAB — CBG MONITORING, ED
Glucose-Capillary: 522 mg/dL (ref 70–99)
Glucose-Capillary: 500 mg/dL — ABNORMAL HIGH (ref 70–99)
Glucose-Capillary: 387 mg/dL — ABNORMAL HIGH (ref 70–99)
Glucose-Capillary: 372 mg/dL — ABNORMAL HIGH (ref 70–99)

## 2019-05-17 LAB — FOLATE: Folate: 12.1 ng/mL (ref >5.9)

## 2019-05-17 LAB — FERRITIN: Ferritin: 579 ng/mL — ABNORMAL HIGH (ref 11–307)

## 2019-05-17 LAB — RETICULOCYTES
Immature Retic Fract: 26.9 % — ABNORMAL HIGH (ref 2.3–15.9)
RBC.: 3.18 MIL/uL — ABNORMAL LOW (ref 3.87–5.11)
Retic Count, Absolute: 40.1 10*3/uL (ref 19.0–186.0)
Retic Ct Pct: 1.3 % (ref 0.4–3.1)

## 2019-05-17 LAB — SARS CORONAVIRUS 2: SARS Coronavirus 2: NOT DETECTED

## 2019-05-17 LAB — IRON AND TIBC
Iron: 16 ug/dL — ABNORMAL LOW (ref 28–170)
Saturation Ratios: 7 % — ABNORMAL LOW (ref 10.4–31.8)
TIBC: 230 ug/dL — ABNORMAL LOW (ref 250–450)
UIBC: 214 ug/dL

## 2019-05-17 LAB — VITAMIN B12: Vitamin B-12: 787 pg/mL (ref 180–914)

## 2019-05-17 LAB — HCG, QUANTITATIVE, PREGNANCY: hCG, Beta Chain, Quant, S: 2 m[IU]/mL (ref <5)

## 2019-05-17 SURGERY — IRRIGATION AND DEBRIDEMENT BUTTOCKS
Anesthesia: General | Site: Buttocks | Laterality: Left

## 2019-05-17 MED ORDER — LACTATED RINGERS IV SOLN
INTRAVENOUS | Status: DC | PRN
  Administered 2019-05-17: 13:00:00 via INTRAVENOUS

## 2019-05-17 MED ORDER — OXYCODONE HCL 5 MG PO TABS
ORAL_TABLET | ORAL | Status: AC
  Filled 2019-05-17: qty 2

## 2019-05-17 MED ORDER — MOMETASONE FURO-FORMOTEROL FUM 200-5 MCG/ACT IN AERO
2.0000 | INHALATION_SPRAY | Freq: Two times a day (BID) | RESPIRATORY_TRACT | Status: DC
  Administered 2019-05-17 – 2019-05-19 (×2): 2 via RESPIRATORY_TRACT
  Filled 2019-05-17: qty 8.8

## 2019-05-17 MED ORDER — HYDROMORPHONE HCL 1 MG/ML IJ SOLN
0.2500 mg | INTRAMUSCULAR | Status: DC | PRN
  Administered 2019-05-17 (×2): 0.5 mg via INTRAVENOUS

## 2019-05-17 MED ORDER — FENTANYL CITRATE (PF) 100 MCG/2ML IJ SOLN
INTRAMUSCULAR | Status: DC | PRN
  Administered 2019-05-17: 100 ug via INTRAVENOUS
  Administered 2019-05-17 (×2): 50 ug via INTRAVENOUS

## 2019-05-17 MED ORDER — DEXTROSE-NACL 5-0.45 % IV SOLN: INTRAVENOUS | Status: DC

## 2019-05-17 MED ORDER — HYDROMORPHONE HCL 1 MG/ML IJ SOLN
INTRAMUSCULAR | Status: AC
  Filled 2019-05-17: qty 1

## 2019-05-17 MED ORDER — ONDANSETRON HCL 4 MG/2ML IJ SOLN
4.0000 mg | Freq: Once | INTRAMUSCULAR | Status: AC
  Administered 2019-05-17: 4 mg via INTRAVENOUS
  Filled 2019-05-17: qty 2

## 2019-05-17 MED ORDER — PHENYLEPHRINE HCL (PRESSORS) 10 MG/ML IV SOLN
INTRAVENOUS | Status: DC | PRN
  Administered 2019-05-17: 80 ug via INTRAVENOUS
  Administered 2019-05-17 (×2): 120 ug via INTRAVENOUS

## 2019-05-17 MED ORDER — MIDAZOLAM HCL 2 MG/2ML IJ SOLN
INTRAMUSCULAR | Status: AC
  Filled 2019-05-17: qty 2

## 2019-05-17 MED ORDER — ACETAMINOPHEN 325 MG PO TABS
650.0000 mg | ORAL_TABLET | Freq: Four times a day (QID) | ORAL | Status: DC
  Administered 2019-05-17: 650 mg via ORAL
  Filled 2019-05-17: qty 2

## 2019-05-17 MED ORDER — VANCOMYCIN HCL 10 G IV SOLR
2000.0000 mg | Freq: Once | INTRAVENOUS | Status: AC
  Administered 2019-05-17: 2000 mg via INTRAVENOUS
  Filled 2019-05-17: qty 2000

## 2019-05-17 MED ORDER — ONDANSETRON HCL 4 MG/2ML IJ SOLN
INTRAMUSCULAR | Status: DC | PRN
  Administered 2019-05-17: 4 mg via INTRAVENOUS

## 2019-05-17 MED ORDER — ACETAMINOPHEN 650 MG RE SUPP: 650.0000 mg | Freq: Four times a day (QID) | RECTAL | Status: DC | PRN

## 2019-05-17 MED ORDER — ONDANSETRON HCL 4 MG/2ML IJ SOLN
INTRAMUSCULAR | Status: AC
  Filled 2019-05-17: qty 2

## 2019-05-17 MED ORDER — DEXAMETHASONE SODIUM PHOSPHATE 10 MG/ML IJ SOLN
INTRAMUSCULAR | Status: AC
  Filled 2019-05-17: qty 1

## 2019-05-17 MED ORDER — IOPAMIDOL (ISOVUE-300) INJECTION 61%
100.0000 mL | Freq: Once | INTRAVENOUS | Status: AC | PRN
  Administered 2019-05-17: 100 mL via INTRAVENOUS

## 2019-05-17 MED ORDER — ONDANSETRON HCL 4 MG PO TABS
4.0000 mg | ORAL_TABLET | Freq: Four times a day (QID) | ORAL | Status: DC | PRN
  Administered 2019-05-19: 4 mg via ORAL
  Filled 2019-05-17: qty 1

## 2019-05-17 MED ORDER — SODIUM CHLORIDE 0.9 % IV BOLUS (SEPSIS)
1000.0000 mL | Freq: Once | INTRAVENOUS | Status: AC
  Administered 2019-05-17: 1000 mL via INTRAVENOUS

## 2019-05-17 MED ORDER — PROMETHAZINE HCL 25 MG/ML IJ SOLN: 6.2500 mg | INTRAMUSCULAR | Status: DC | PRN

## 2019-05-17 MED ORDER — MORPHINE SULFATE (PF) 2 MG/ML IV SOLN
INTRAVENOUS | Status: AC
  Administered 2019-05-17: 2 mg via INTRAVENOUS
  Filled 2019-05-17: qty 1

## 2019-05-17 MED ORDER — VANCOMYCIN HCL 10 G IV SOLR
1750.0000 mg | INTRAVENOUS | Status: DC
  Administered 2019-05-18: 1750 mg via INTRAVENOUS
  Filled 2019-05-17: qty 1750

## 2019-05-17 MED ORDER — ACETAMINOPHEN 325 MG PO TABS: 650.0000 mg | ORAL_TABLET | ORAL | Status: DC | PRN

## 2019-05-17 MED ORDER — HYDROMORPHONE HCL 1 MG/ML IJ SOLN
1.0000 mg | Freq: Once | INTRAMUSCULAR | Status: AC
  Administered 2019-05-17: 1 mg via INTRAVENOUS
  Filled 2019-05-17: qty 1

## 2019-05-17 MED ORDER — KETAMINE HCL 50 MG/5ML IJ SOSY
PREFILLED_SYRINGE | INTRAMUSCULAR | Status: AC
  Filled 2019-05-17: qty 5

## 2019-05-17 MED ORDER — SODIUM CHLORIDE 0.9 % IV BOLUS
1000.0000 mL | Freq: Once | INTRAVENOUS | Status: AC
  Administered 2019-05-17: 1000 mL via INTRAVENOUS

## 2019-05-17 MED ORDER — INSULIN GLARGINE 100 UNIT/ML ~~LOC~~ SOLN
40.0000 [IU] | Freq: Every day | SUBCUTANEOUS | Status: DC
  Administered 2019-05-17: 40 [IU] via SUBCUTANEOUS
  Filled 2019-05-17 (×2): qty 0.4

## 2019-05-17 MED ORDER — ACETAMINOPHEN 325 MG PO TABS: 650.0000 mg | ORAL_TABLET | Freq: Four times a day (QID) | ORAL | Status: DC | PRN

## 2019-05-17 MED ORDER — SUGAMMADEX SODIUM 200 MG/2ML IV SOLN
INTRAVENOUS | Status: DC | PRN
  Administered 2019-05-17: 400 mg via INTRAVENOUS

## 2019-05-17 MED ORDER — SODIUM CHLORIDE 0.9 % IV SOLN
1000.0000 mL | INTRAVENOUS | Status: DC
  Administered 2019-05-17: 1000 mL via INTRAVENOUS

## 2019-05-17 MED ORDER — ROCURONIUM BROMIDE 50 MG/5ML IV SOSY
PREFILLED_SYRINGE | INTRAVENOUS | Status: DC | PRN
  Administered 2019-05-17: 40 mg via INTRAVENOUS

## 2019-05-17 MED ORDER — PHENYLEPHRINE 40 MCG/ML (10ML) SYRINGE FOR IV PUSH (FOR BLOOD PRESSURE SUPPORT)
PREFILLED_SYRINGE | INTRAVENOUS | Status: AC
  Filled 2019-05-17: qty 10

## 2019-05-17 MED ORDER — KETAMINE HCL 10 MG/ML IJ SOLN
INTRAMUSCULAR | Status: DC | PRN
  Administered 2019-05-17: 50 mg via INTRAVENOUS

## 2019-05-17 MED ORDER — OXYCODONE HCL 5 MG PO TABS
5.0000 mg | ORAL_TABLET | ORAL | Status: DC | PRN
  Administered 2019-05-17 – 2019-05-18 (×3): 10 mg via ORAL
  Filled 2019-05-17 (×3): qty 2

## 2019-05-17 MED ORDER — MEPERIDINE HCL 25 MG/ML IJ SOLN: 6.2500 mg | INTRAMUSCULAR | Status: DC | PRN

## 2019-05-17 MED ORDER — PANTOPRAZOLE SODIUM 40 MG PO TBEC
40.0000 mg | DELAYED_RELEASE_TABLET | Freq: Every day | ORAL | Status: DC
  Administered 2019-05-18 – 2019-05-19 (×2): 40 mg via ORAL
  Filled 2019-05-17 (×3): qty 1

## 2019-05-17 MED ORDER — PROPOFOL 10 MG/ML IV BOLUS
INTRAVENOUS | Status: DC | PRN
  Administered 2019-05-17: 200 mg via INTRAVENOUS

## 2019-05-17 MED ORDER — PIPERACILLIN-TAZOBACTAM 3.375 G IVPB
3.3750 g | Freq: Three times a day (TID) | INTRAVENOUS | Status: DC
  Administered 2019-05-17 – 2019-05-18 (×3): 3.375 g via INTRAVENOUS
  Filled 2019-05-17 (×5): qty 50

## 2019-05-17 MED ORDER — INSULIN REGULAR(HUMAN) IN NACL 100-0.9 UT/100ML-% IV SOLN
INTRAVENOUS | Status: DC
  Administered 2019-05-17: 4.6 [IU]/h via INTRAVENOUS
  Filled 2019-05-17: qty 100

## 2019-05-17 MED ORDER — 0.9 % SODIUM CHLORIDE (POUR BTL) OPTIME
TOPICAL | Status: DC | PRN
  Administered 2019-05-17: 1000 mL

## 2019-05-17 MED ORDER — LIDOCAINE 2% (20 MG/ML) 5 ML SYRINGE
INTRAMUSCULAR | Status: DC | PRN
  Administered 2019-05-17: 100 mg via INTRAVENOUS

## 2019-05-17 MED ORDER — SUCCINYLCHOLINE CHLORIDE 20 MG/ML IJ SOLN
INTRAMUSCULAR | Status: DC | PRN
  Administered 2019-05-17: 200 mg via INTRAVENOUS

## 2019-05-17 MED ORDER — FENTANYL CITRATE (PF) 250 MCG/5ML IJ SOLN
INTRAMUSCULAR | Status: AC
  Filled 2019-05-17: qty 5

## 2019-05-17 MED ORDER — INSULIN ASPART 100 UNIT/ML ~~LOC~~ SOLN
0.0000 [IU] | Freq: Three times a day (TID) | SUBCUTANEOUS | Status: DC
  Administered 2019-05-17: 5 [IU] via SUBCUTANEOUS
  Administered 2019-05-18: 8 [IU] via SUBCUTANEOUS

## 2019-05-17 MED ORDER — PROPOFOL 10 MG/ML IV BOLUS
INTRAVENOUS | Status: AC
  Filled 2019-05-17: qty 20

## 2019-05-17 MED ORDER — ONDANSETRON HCL 4 MG/2ML IJ SOLN: 4.0000 mg | Freq: Four times a day (QID) | INTRAMUSCULAR | Status: DC | PRN

## 2019-05-17 MED ORDER — MORPHINE SULFATE (PF) 2 MG/ML IV SOLN
2.0000 mg | INTRAVENOUS | Status: DC | PRN
  Administered 2019-05-17 (×2): 2 mg via INTRAVENOUS
  Filled 2019-05-17: qty 1

## 2019-05-17 MED ORDER — PANTOPRAZOLE SODIUM 40 MG IV SOLR: 40.0000 mg | Freq: Every day | INTRAVENOUS | Status: DC

## 2019-05-17 MED ORDER — ATORVASTATIN CALCIUM 10 MG PO TABS
20.0000 mg | ORAL_TABLET | Freq: Every day | ORAL | Status: DC
  Administered 2019-05-18 – 2019-05-25 (×8): 20 mg via ORAL
  Filled 2019-05-17 (×10): qty 2

## 2019-05-17 MED ORDER — SODIUM CHLORIDE 0.9 % IV SOLN
INTRAVENOUS | Status: DC
  Administered 2019-05-17 – 2019-05-18 (×2): via INTRAVENOUS

## 2019-05-17 SURGICAL SUPPLY — 34 items
BLADE CLIPPER SURG (BLADE) IMPLANT
BNDG GAUZE ELAST 4 BULKY (GAUZE/BANDAGES/DRESSINGS) IMPLANT
CANISTER SUCT 3000ML PPV (MISCELLANEOUS) ×2 IMPLANT
COVER SURGICAL LIGHT HANDLE (MISCELLANEOUS) ×2 IMPLANT
COVER WAND RF STERILE (DRAPES) ×2 IMPLANT
DRAPE LAPAROSCOPIC ABDOMINAL (DRAPES) ×1 IMPLANT
DRSG KUZMA FLUFF (GAUZE/BANDAGES/DRESSINGS) ×1 IMPLANT
DRSG PAD ABDOMINAL 8X10 ST (GAUZE/BANDAGES/DRESSINGS) IMPLANT
ELECT BLADE 4.0 EZ CLEAN MEGAD (MISCELLANEOUS) ×2
ELECT CAUTERY BLADE 6.4 (BLADE) IMPLANT
ELECT REM PT RETURN 9FT ADLT (ELECTROSURGICAL) ×2
ELECTRODE BLDE 4.0 EZ CLN MEGD (MISCELLANEOUS) IMPLANT
ELECTRODE REM PT RTRN 9FT ADLT (ELECTROSURGICAL) ×1 IMPLANT
GAUZE SPONGE 4X4 12PLY STRL (GAUZE/BANDAGES/DRESSINGS) IMPLANT
GLOVE BIO SURGEON STRL SZ7 (GLOVE) ×2 IMPLANT
GLOVE BIOGEL PI IND STRL 7.5 (GLOVE) ×1 IMPLANT
GLOVE BIOGEL PI INDICATOR 7.5 (GLOVE) ×1
GOWN STRL REUS W/ TWL LRG LVL3 (GOWN DISPOSABLE) ×2 IMPLANT
GOWN STRL REUS W/TWL LRG LVL3 (GOWN DISPOSABLE) ×4
HANDPIECE INTERPULSE COAX TIP (DISPOSABLE) ×2
KIT BASIN OR (CUSTOM PROCEDURE TRAY) ×2 IMPLANT
KIT TURNOVER KIT B (KITS) ×2 IMPLANT
LEGGING LITHOTOMY PAIR STRL (DRAPES) ×1 IMPLANT
NS IRRIG 1000ML POUR BTL (IV SOLUTION) ×2 IMPLANT
PACK GENERAL/GYN (CUSTOM PROCEDURE TRAY) ×2 IMPLANT
PAD ABD 7.5X8 STRL (GAUZE/BANDAGES/DRESSINGS) ×1 IMPLANT
PAD ABD 8X10 STRL (GAUZE/BANDAGES/DRESSINGS) ×1 IMPLANT
PAD ARMBOARD 7.5X6 YLW CONV (MISCELLANEOUS) ×2 IMPLANT
PENCIL SMOKE EVACUATOR (MISCELLANEOUS) ×2 IMPLANT
SET HNDPC FAN SPRY TIP SCT (DISPOSABLE) IMPLANT
SWAB COLLECTION DEVICE MRSA (MISCELLANEOUS) IMPLANT
SWAB CULTURE ESWAB REG 1ML (MISCELLANEOUS) IMPLANT
TOWEL OR 17X24 6PK STRL BLUE (TOWEL DISPOSABLE) ×2 IMPLANT
TOWEL OR 17X26 10 PK STRL BLUE (TOWEL DISPOSABLE) ×2 IMPLANT

## 2019-05-17 NOTE — ED Notes (Signed)
No Diet was ordered for Lunch.

## 2019-05-17 NOTE — Anesthesia Procedure Notes (Signed)
Procedure Name: Intubation Date/Time: 05/17/2019 1:30 PM Performed by: Inda Coke, CRNA Pre-anesthesia Checklist: Patient identified, Emergency Drugs available, Suction available and Patient being monitored Patient Re-evaluated:Patient Re-evaluated prior to induction Oxygen Delivery Method: Circle System Utilized Preoxygenation: Pre-oxygenation with 100% oxygen Induction Type: IV induction and Rapid sequence Ventilation: Mask ventilation without difficulty Laryngoscope Size: Glidescope and 4 Grade View: Grade I Tube type: Oral Number of attempts: 1 Airway Equipment and Method: Stylet and Oral airway Placement Confirmation: ETT inserted through vocal cords under direct vision,  positive ETCO2 and breath sounds checked- equal and bilateral Secured at: 23 cm Tube secured with: Tape Dental Injury: Teeth and Oropharynx as per pre-operative assessment  Difficulty Due To: Difficult Airway- due to large tongue and Difficulty was anticipated Future Recommendations: Recommend- induction with short-acting agent, and alternative techniques readily available

## 2019-05-17 NOTE — Transfer of Care (Signed)
Immediate Anesthesia Transfer of Care Note  Patient: [name]  Procedure(s) Performed: IRRIGATION AND DEBRIDEMENT BUTTOCKS (Left Buttocks)  Patient Location: PACU  Anesthesia Type:General  Level of Consciousness: awake, alert  and oriented  Airway & Oxygen Therapy: Patient Spontanous Breathing and Patient connected to face mask oxygen  Post-op Assessment: Report given to RN and Post -op Vital signs reviewed and stable  Post vital signs: Reviewed and stable  Last Vitals:  Vitals Value Taken Time  BP 130/77 05/17/19 1451  Temp    Pulse 104 05/17/19 1500  Resp 10 05/17/19 1500  SpO2 99 % 05/17/19 1500  Vitals shown include unvalidated device data.  Last Pain:  Vitals:   05/17/19 1054  TempSrc:   PainSc: 4          Complications: No apparent anesthesia complications

## 2019-05-17 NOTE — ED Notes (Signed)
Pt to CT

## 2019-05-17 NOTE — ED Notes (Signed)
Report to OR, pt belongings with pt, pt has called and talked with husband and updated him

## 2019-05-17 NOTE — ED Notes (Signed)
Abscess noted in left vaginal and upper pelvis area folds. Broken skin and purulent oozing noted. Small area of necrotic tissue noted near vaginal area abscess

## 2019-05-17 NOTE — Progress Notes (Signed)
BP is 120/102.  MD notified.  Will continue to monitor.

## 2019-05-17 NOTE — Progress Notes (Signed)
Pharmacy Antibiotic Note  [name] is a 43 y.o. female admitted on 05/17/2019 with buttock abscess.  Pharmacy has been consulted for Zosyn dosing.  ID: Vanc/Zosyn D#1 for cellulitis - Afebrile, WBC 14.4, Scr 1.01 Surg team has ordered CT with plans for OR for I&D 6/11. Cultures pending  Vanc  6/11>> Zosyn 6/11>>  Plan: Vanc 2gm IV x 1 then 1750mg  IV Q24H Zosyn 3.375g IV q 8 hrs.   Height: 5\' 2"  (157.5 cm) Weight: (!) 308 lb 10.3 oz (140 kg) IBW/kg (Calculated) : 50.1  Temp (24hrs), Avg:98.3 F (36.8 C), Min:98 F (36.7 C), Max:98.8 F (37.1 C)  Recent Labs  Lab 05/17/19 0640  WBC 14.4*  CREATININE 1.01*    Estimated Creatinine Clearance: 99.6 mL/min (A) (by C-G formula based on SCr of 1.01 mg/dL (H)).    Allergies  Allergen Reactions  . Kiwi Extract Shortness Of Breath and Swelling  . Cefepime     AKI, see records from [place] hospitalization in January 2020  . Trental [Pentoxifylline] Nausea And Vomiting  . Nubain [Nalbuphine Hcl] Other (See Comments)    "FEELS LIKE SOMETHING CRAWLING ON ME"    [place]inda [place] S. [name], PharmD, BCPS Clinical Staff Pharmacist [place], [place] 05/17/2019 4:09 PM

## 2019-05-17 NOTE — ED Triage Notes (Signed)
Pt coming from home after complaints of abscess pain on buttock area. Upon EMS arrival pt's CBG was 511. Pt stated she ate around 0100 and did not take her bedtime glucose, said that is not out of her norm for it to be that high. Upon assessment pt states she has "been sick" for the past week. States she was tested for COVID at drive-thru site 6 days ago and is still awaiting results.

## 2019-05-17 NOTE — Consult Note (Signed)
[facility] Consult/Admission Note  [name] [birth year]  [id number].    Requesting Provider: Caryl Ada, PA-C Chief Complaint/Reason for Consult: L buttock abscess   HPI:   Pt is a 43 yo female with a hx of  left BKA, amputation all 5 digits right foot, type 2 diabetes with neuropathy, allergic rhinitis, morbid obesity, GERD, right carpal tunnel syndrome, left hand osteoarthritis, obesity hypoventilation syndrome who presented to the ED with complaints of L buttock pain and swelling. Pt has had abscesses in the past but never this severe. Pt states she noticed some pain about a week ago but this has progressed to severe pain, constant, worse with touch, non radiating with no associated symptoms. She states she has been using warm compresses and there has been some drainage. She denies fever, chills, or urinary symptoms. Pt states she was tested for COVID last week and was negative. She lives at home with her daughter.   ROS:  Review of Systems  Constitutional: Negative for chills, diaphoresis and fever.  HENT: Negative for sore throat.   Respiratory: Negative for cough and shortness of breath.   Cardiovascular: Negative for chest pain.  Gastrointestinal: Negative for abdominal pain, blood in stool, constipation, diarrhea, nausea and vomiting.  Genitourinary: Negative for dysuria.  Skin: Negative for rash.       + L buttock pain, swelling and drainage  Neurological: Negative for dizziness and loss of consciousness.  All other systems reviewed and are negative.    Family History  Problem Relation Age of Onset  . Diabetes Mother   . Hyperlipidemia Mother   . Depression Mother   . Varicose Veins Mother   . Heart attack Paternal Uncle   . Heart disease Paternal Grandmother   . Heart attack Paternal Grandmother   . Heart attack Paternal Grandfather   . Heart disease Paternal Grandfather   . Heart attack Father   . Cancer Maternal Grandmother        COLON  .  Hypertension Maternal Grandmother   . Hyperlipidemia Maternal Grandmother   . Diabetes Maternal Grandmother   . Other Maternal Grandfather        GUN SHOT    Past Medical History:  Diagnosis Date  . Acute osteomyelitis of ankle or foot, left ([place]) 01/31/2018  . Alveolar hypoventilation   . Anemia    not on iron pill  . Asthma   . Bipolar 2 disorder ([place])   . Carpal tunnel syndrome on right    recurrent  . Cellulitis 08/2010-08/2011  . Chronic pain   . COPD (chronic obstructive pulmonary disease) ([name])    Symbicort daily and Proventil as needed  . Costochondritis   . Depression   . Diabetes mellitus type II, uncontrolled ([place]) 2000   Type 2, Uncontrolled.Takes Lantus daily.Fasting blood sugar runs 150  . Dizziness    occasionally  . Drug-seeking behavior   . GERD (gastroesophageal reflux disease)    takes Pantoprazole and Zantac daily  . Headache    migraine-last one about a yr ago.Topamax daily  . HLD (hyperlipidemia)    takes Atorvastatin daily  . Hypertension    takes Lisinopril and Coreg daily  . Morbid obesity ([place])   . Muscle spasm    takes Flexeril as needed  . Nocturia   . OSA on CPAP   . Peripheral neuropathy    takes Gabapentin daily  . Pneumonia    "walking" several yrs ago and as a baby (12/05/2018)  . Rectal fissure   .  Restless leg   . Syncope 02/25/2016  . Urinary frequency   . Varicose veins    Right medial thigh and Left leg     Past Surgical History:  Procedure Laterality Date  . AMPUTATION Left 02/01/2018   Procedure: LEFT FOURTH AND 5TH TOE RAY AMPUTATION;  Surgeon: [name], MD;  Location: [facility];  Service: Orthopedics;  Laterality: Left;  . AMPUTATION Left 03/03/2018   Procedure: LEFT BELOW KNEE AMPUTATION;  Surgeon: [name], MD;  Location: [facility];  Service: Orthopedics;  Laterality: Left;  . CARPAL TUNNEL RELEASE Bilateral   . CESAREAN SECTION  2007  . KNEE ARTHROSCOPY Right 07/17/2010  . LEFT HEART CATHETERIZATION WITH CORONARY  ANGIOGRAM N/A 07/27/2012   Procedure: LEFT HEART CATHETERIZATION WITH CORONARY ANGIOGRAM;  Surgeon: Sherren Mocha, MD;  Location: [facility] CATH LAB;  Service: Cardiovascular;  Laterality: N/A;  . MASS EXCISION N/A 06/29/2013   Procedure:  WIDE LOCAL EXCISION OF POSTERIOR NECK ABSCESS;  Surgeon: Ralene Ok, MD;  Location: [facility];  Service: General;  Laterality: N/A;  . REPAIR KNEE LIGAMENT Left    "fixed ligaments and chipped patella"    Social History:  reports that she quit smoking about 25 years ago. Her smoking use included cigarettes. She has a 0.09 pack-year smoking history. She has never used smokeless tobacco. She reports current drug use. She reports that she does not drink alcohol.  Allergies:  Allergies  Allergen Reactions  . Kiwi Extract Shortness Of Breath and Swelling  . Cefepime     AKI, see records from [place] hospitalization in January 2020  . Trental [Pentoxifylline] Nausea And Vomiting  . Nubain [Nalbuphine Hcl] Other (See Comments)    "FEELS LIKE SOMETHING CRAWLING ON ME"    (Not in a hospital admission)   Blood pressure 121/60, pulse 80, temperature 98.8 F (37.1 C), temperature source Oral, resp. rate 20, height 5\' 2"  (1.575 m), weight (!) 140 kg, SpO2 98 %.  Physical Exam Vitals signs and nursing note reviewed. Exam conducted with a chaperone present.  Constitutional:      General: She is awake. She is not in acute distress.    Appearance: She is morbidly obese. She is not ill-appearing or toxic-appearing.  HENT:     Head: Normocephalic and atraumatic.     Nose: Nose normal.     Mouth/Throat:     Comments: Pt wearing mask Eyes:     General: Lids are normal.     Conjunctiva/sclera:     Right eye: Right conjunctiva is not injected.     Left eye: Left conjunctiva is not injected.     Pupils: Pupils are equal, round, and reactive to light.  Neck:     Musculoskeletal: Full passive range of motion without pain and normal range of motion.  Cardiovascular:      Rate and Rhythm: Normal rate and regular rhythm.     Pulses:          Radial pulses are 2+ on the right side and 2+ on the left side.     Heart sounds: Normal heart sounds, S1 normal and S2 normal.  Pulmonary:     Effort: Pulmonary effort is normal.     Breath sounds: Normal breath sounds. No wheezing, rhonchi or rales.  Abdominal:     General: Bowel sounds are normal. There is no distension.     Palpations: Abdomen is soft.     Tenderness: There is no abdominal tenderness.  Genitourinary:  Comments: Large area of induration and erythema with open wounds oozing purulent drainage of L buttock. Induration track posteriorly.  Musculoskeletal: Normal range of motion.        General: Deformity (L AKA) present. No tenderness.  Skin:    General: Skin is warm and dry.  Neurological:     Mental Status: She is alert and oriented to person, place, and time.  Psychiatric:        Mood and Affect: Mood normal.        Behavior: Behavior normal. Behavior is cooperative.         Results for orders placed or performed during the hospital encounter of 05/17/19 (from the past 48 hour(s))  Comprehensive metabolic panel     Status: Abnormal   Collection Time: 05/17/19  6:40 AM  Result Value Ref Range   Sodium 127 (L) 135 - 145 mmol/L   Potassium 4.5 3.5 - 5.1 mmol/L   Chloride 95 (L) 98 - 111 mmol/L   CO2 22 22 - 32 mmol/L   Glucose, Bld 588 (HH) 70 - 99 mg/dL    Comment: CRITICAL RESULT CALLED TO, READ BACK BY AND VERIFIED WITH: A SANCHEZ,RN [id number] WILDERK    BUN 15 6 - 20 mg/dL   Creatinine, Ser 1.01 (H) 0.44 - 1.00 mg/dL   Calcium 8.6 (L) 8.9 - 10.3 mg/dL   Total Protein 6.7 6.5 - 8.1 g/dL   Albumin 1.7 (L) 3.5 - 5.0 g/dL   AST 10 (L) 15 - 41 U/L   ALT 14 0 - 44 U/L   Alkaline Phosphatase 291 (H) 38 - 126 U/L   Total Bilirubin 0.2 (L) 0.3 - 1.2 mg/dL   GFR calc non Af Amer >60 >60 mL/min   GFR calc Af Amer >60 >60 mL/min   Anion gap 10 5 - 15    Comment: Performed at [facility], [place] [address], [place],  [address]  CBC with Differential     Status: Abnormal   Collection Time: 05/17/19  6:40 AM  Result Value Ref Range   WBC 14.4 (H) 4.0 - 10.5 K/uL   RBC 3.14 (L) 3.87 - 5.11 MIL/uL   Hemoglobin 8.9 (L) 12.0 - 15.0 g/dL   HCT 28.6 (L) 36.0 - 46.0 %   MCV 91.1 80.0 - 100.0 fL   MCH 28.3 26.0 - 34.0 pg   MCHC 31.1 30.0 - 36.0 g/dL   RDW 14.3 11.5 - 15.5 %   Platelets 489 (H) 150 - 400 K/uL   nRBC 0.0 0.0 - 0.2 %   Neutrophils Relative % 69 %   Neutro Abs 10.0 (H) 1.7 - 7.7 K/uL   Lymphocytes Relative 17 %   Lymphs Abs 2.4 0.7 - 4.0 K/uL   Monocytes Relative 8 %   Monocytes Absolute 1.2 (H) 0.1 - 1.0 K/uL   Eosinophils Relative 2 %   Eosinophils Absolute 0.2 0.0 - 0.5 K/uL   Basophils Relative 0 %   Basophils Absolute 0.0 0.0 - 0.1 K/uL   Immature Granulocytes 4 %   Abs Immature Granulocytes 0.50 (H) 0.00 - 0.07 K/uL    Comment: Performed at [facility], [place] [address], [place],  [address]  hCG, quantitative, pregnancy     Status: None   Collection Time: 05/17/19  6:40 AM  Result Value Ref Range   hCG, Beta Chain, Quant, S 2 <5 mIU/mL    Comment:  GEST. AGE      CONC.  (mIU/mL)   <=1 WEEK        5 - 50     2 WEEKS       50 - 500     3 WEEKS       100 - 10,000     4 WEEKS     1,000 - 30,000     5 WEEKS     3,500 - 115,000   6-8 WEEKS     12,000 - 270,000    12 WEEKS     15,000 - 220,000        FEMALE AND NON-PREGNANT FEMALE:     LESS THAN 5 mIU/mL Performed at [facility], [place] [address] [place], [place] [address]   CBG monitoring, ED     Status: Abnormal   Collection Time: 05/17/19  8:17 AM  Result Value Ref Range   Glucose-Capillary 522 (HH) 70 - 99 mg/dL  SARS Coronavirus 2     Status: None   Collection Time: 05/17/19  8:46 AM  Result Value Ref Range   SARS Coronavirus 2 NOT DETECTED NOT DETECTED    Comment: (NOTE) SARS-CoV-2 target nucleic acids are NOT DETECTED. The SARS-CoV-2 RNA is  generally detectable in upper and lower respiratory specimens during the acute phase of infection.  Negative  results do not preclude SARS-CoV-2 infection, do not rule out co-infections with other pathogens, and should not be used as the sole basis for treatment or other patient management decisions.  Negative results must be combined with clinical observations, patient history, and epidemiological information. The expected result is Not Detected. Fact Sheet for Patients: http://www.biofiredefense.com/wp-content/uploads/2020/03/BIOFIRE-COVID -19-patients.pdf Fact Sheet for Healthcare Providers: http://www.biofiredefense.com/wp-content/uploads/2020/03/BIOFIRE-COVID -19-hcp.pdf This test is not yet approved or cleared by the [place] and  has been authorized for detection and/or diagnosis of SARS-CoV-2 by FDA under an Emergency Use Authorization (EUA).  This EUA will remain in effec t (meaning this test can be used) for the duration of  the COVID-19 declaration under Section 564(b)(1) of the Act, 21 U.S.C. section 360bbb-3(b)(1), unless the authorization is terminated or revoked sooner. Performed at [facility], [place] [address] [place], [place] [address]   CBG monitoring, ED     Status: Abnormal   Collection Time: 05/17/19  9:39 AM  Result Value Ref Range   Glucose-Capillary 500 (H) 70 - 99 mg/dL   *Note: Due to a large number of results and/or encounters for the requested time period, some results have not been displayed. A complete set of results can be found in Results Review.   No results found.    Assessment/Plan Active Problems:   Abscess of right buttock  Type II DM Hyperglycemia - presented with BS >500, most recent check it was 387 OSA HTN HLD GERD COPD Chronic pain L AKA Bipolar  - above per medicine  L buttock abscess - this is tracking posteriorly and difficult to assess if tracking anteriorly - CT pelvis pending to better assess depth  and tracking of wound - OR today for I&D - NPO, IVF, IV antibiotics, glucose stablizer   Thank you for the consult.   Kalman Drape, [facility] 05/17/2019, 10:45 AM Pager: [phone] Consults: [phone] Mon-Fri 7:00 am-4:30 pm Sat-Sun 7:00 am-11:30 am

## 2019-05-17 NOTE — Anesthesia Preprocedure Evaluation (Addendum)
Anesthesia Evaluation  Patient identified by MRN, date of birth, ID band Patient awake    Reviewed: Allergy & Precautions, NPO status , Patient's Chart, lab work & pertinent test results, reviewed documented beta blocker date and time   Airway Mallampati: III  TM Distance: >3 FB Neck ROM: Full    Dental  (+) Dental Advisory Given, Teeth Intact   Pulmonary asthma , sleep apnea and Continuous Positive Airway Pressure Ventilation , pneumonia, COPD, former smoker,    Pulmonary exam normal breath sounds clear to auscultation       Cardiovascular hypertension, Pt. on medications and Pt. on home beta blockers + Peripheral Vascular Disease  Normal cardiovascular exam Rhythm:Regular Rate:Normal  '18 TTE -  Ejection fraction was in the range of 55%   to 60%. Left atrium was mildly dilated.   Neuro/Psych  Headaches, PSYCHIATRIC DISORDERS Depression Bipolar Disorder Diabetic neuropathy  Neuromuscular disease    GI/Hepatic Neg liver ROS, GERD  Medicated and Controlled,  Endo/Other  diabetes, Poorly Controlled, Type 2, Insulin DependentMorbid obesity  Renal/GU Renal InsufficiencyRenal disease  negative genitourinary   Musculoskeletal  (+) Arthritis , Osteoarthritis,  Osteomyelitis left foot   Abdominal   Peds  Hematology  (+) anemia ,   Anesthesia Other Findings Hyponatremia, hypocalcemia; hx chronic pain and drug seeking behavior  Reproductive/Obstetrics                                                              Anesthesia Evaluation  Patient identified by MRN, date of birth, ID band Patient awake    Reviewed: Allergy & Precautions, H&P , NPO status , Patient's Chart, lab work & pertinent test results, reviewed documented beta blocker date and time   History of Anesthesia Complications Negative for: history of anesthetic complications  Airway Mallampati: III  Neck ROM: Limited     Dental  (+) Teeth Intact and Dental Advisory Given   Pulmonary asthma , sleep apnea and Continuous Positive Airway Pressure Ventilation , COPD COPD inhaler,    + decreased breath sounds      Cardiovascular hypertension, Pt. on medications and Pt. on home beta blockers + angina Rhythm:Regular Rate:Normal  Cardiac cath 2013 - "clear per patient"   Neuro/Psych  Neuromuscular disease    GI/Hepatic Neg liver ROS, GERD-  Medicated and Controlled,  Endo/Other  diabetes, Type 2, Oral Hypoglycemic Agents and Insulin DependentMorbid obesity  Renal/GU negative Renal ROS     Musculoskeletal negative musculoskeletal ROS (+)   Abdominal (+) + obese,   Peds  Hematology negative hematology ROS (+)   Anesthesia Other Findings   Reproductive/Obstetrics negative OB ROS                          Anesthesia Physical Anesthesia Plan  ASA: IV  Anesthesia Plan: General   Post-op Pain Management:    Induction: Intravenous  Airway Management Planned: Oral ETT and Video Laryngoscope Planned  Additional Equipment:   Intra-op Plan:   Post-operative Plan: Extubation in OR and Possible Post-op intubation/ventilation  Informed Consent: I have reviewed the patients History and Physical, chart, labs and discussed the procedure including the risks, benefits and alternatives for the proposed anesthesia with the patient or authorized representative who has indicated his/her understanding and acceptance.  Dental advisory given  Plan Discussed with: CRNA and Surgeon  Anesthesia Plan Comments:         Anesthesia Quick Evaluation  Anesthesia Physical  Anesthesia Plan  ASA: IV and emergent  Anesthesia Plan: General   Post-op Pain Management:    Induction: Intravenous and Rapid sequence  PONV Risk Score and Plan: 4 or greater and Ondansetron, Treatment may vary due to age or medical condition, Dexamethasone and Midazolam  Airway Management Planned:  Oral ETT  Additional Equipment: None  Intra-op Plan:   Post-operative Plan: Extubation in OR  Informed Consent: I have reviewed the patients History and Physical, chart, labs and discussed the procedure including the risks, benefits and alternatives for the proposed anesthesia with the patient or authorized representative who has indicated his/her understanding and acceptance.     Dental advisory given  Plan Discussed with: CRNA  Anesthesia Plan Comments:        Anesthesia Quick Evaluation

## 2019-05-17 NOTE — Progress Notes (Addendum)
Family Medicine Teaching Service Daily Progress Note Intern Pager: [phone]  Patient name: Karinda Cabriales Medical record number: [id number] Date of birth: 14-Sep-1976 Age: 43 y.o. Gender: female  Primary Care Provider: [name], MD Consultants: General Surgery  Code Status: Full  Pt Overview and Major Events to Date:  6/11 Admitted to FPTS, I&D Left Buttocks Abscess  Assessment and Plan: Atalaya Zappia is a 43 y.o. female presenting with left buttock abscess and severely elevated blood glucose. PMH is significant for left BKA, amputation all 5 digits right foot, type 2 diabetes with neuropathy, allergic rhinitis, morbid obesity, GERD, right carpal tunnel syndrome, left hand osteoarthritis, obesity hypoventilation syndrome   Left buttock abscess, 1 day S/P I&D Patient with large left-sided buttock abscess, I&D by surgery on 6/11.  Patient also on vancomycin and Zosyn since 6/11 given risk for polymicrobial infection.  Per op report, a lot of necrotic tissue as well as large abscess.  Wound cultures with GPC and GPR, otherwise pending.  Blood cultures NG1D.  Patient was febrile overnight to 102 at 2200 and was hypertensive at 2100, then hypotensive at 2300 to 90/50, s/p 1L NS bolus and on NS 125cc/hr overnight.  BP this AM 103/57.  Also briefly placed on 1.5 L per nasal cannula after surgery yesterday, now on room air.  WBC 14.4>pending this AM.  No necrosis noted in perineum, although difficult to exam due to patient cooperation and body habitus. -surgery follow, appreciate recs - 1 day s/p I&D left buttock abscess - f/u wound cultues: GPC and GPR - f/u blood cultures: NG1D - cont vanc/zosyn (6/11- ), pending cultures - add clindamycin (6/12- ) for Group A strep coverage and evidence supporting improved mortality outcomes in necrotizing infections, given necrosis noted during I&D on 6/11 - cont IVF @ 120 cc/hr - pain meds: tylenol q4h sch, oxy 5-72m q4h prn, morphine 2 mg q4h prn  -  PT/OT - place foley to accurately monitor UOP as patient has been on vancomycin and would need to monitor for AKI (she has been refusing lab draws)  Midline abscess, beneath panus above mos pubis See photo in physical exam.  Purulent fluid expressed from abscess while examining perineal area this morning.  Surgery made aware of this and patient made n.p.o. for possible procedure.  Not noted on CT pelvis, but full anterior abdominal wall not seen in imaging. - surgery to see - plan per above  Hyperglycemia: Improved Hgb A1c 17.5 on 6/11.  Home meds: insulin 70/30 70 units in a.m., 35 units in p.m.  Patient's CBG on admission 550 without DKA, was started on insulin drip by ED, able to transition after surgery when patient awake and able to tolerate a diet.  Received Lantus 40u and mSSI.  CBG this AM 273. Given her possible return to OR today and current infection, she will have unreliable PO intake today and would benefit from tighter glucose control.  Will switch back to insulin gtt for this. - d/c lantus and SSI - insulin gtt, will likely transition off 6/13 if able to reliably eat - dextrose containing fluids if CBGs <250 - CBGs q1h  Morbid obesity BMI 56.45.  Limited physical activity given amputations. - nutrition consult as outpatient - consider obesity medicine consult as outpatient as well - could also consider adding Victoza to DM regimen for glucose control and weight loss benefit  Bilateral amputations Left BKA, all toes amputated from right foot.  These wound sites are clean dry and intact, well-healed. -  no intervention needed  Obesity hypoventilation syndrome Per chart review does not have diagnosis of sleep apnea, but given body habitus likely experiencing symptoms.  Could likely benefit from sleep study as an outpatient. - CPAP QHS - outpatient sleep study  Asthma Well-controlled.  Takes Symbicort and albuterol as needed for symptoms.   Ruthe Mannan while inpatient  (formulary)  Diabetic neuropathy Takes gabapentin 1200 mg 3 times daily.  Held while NPO and did not reorder after surgery given somnolence.   - can restart gabapentin on request  Elevated alk phos 291 on admission, this AM pending.  Likely 2/2 inflammation from large gluteal abscess. - could consider GGT or RUQ if suspicion arises for hepatic process  Severe protein calorie malnutrition Alb 1.7. Add ensure shakes when no longer npo.  Anemia Hgb 10.9 on 6/2.  On admission was 8.9.  This AM pending.  Iron panel with low iron (16), low TIBC (230), low saturation (7), therefore likely IDA and possible component of anemia of chronic disease. -monitor CBC  FEN/GI: NPO pending more surgical intervention today PPx: holding pending more intervention  Disposition: Pending clinical improvement, PT OT recs  Subjective:  Patient complains of pain this a.m. after examining perineal area.  States that pain medication has been helping her pain in her buttocks from her I&D.  Objective: Temp:  [98 F (36.7 C)-102 F (38.9 C)] 100.3 F (37.9 C) (06/12 0555) Pulse Rate:  [80-154] 101 (06/12 0555) Resp:  [15-21] 16 (06/12 0555) BP: (87-155)/(50-126) 103/57 (06/12 0555) SpO2:  [87 %-99 %] 97 % (06/12 0555)  Physical Exam:  General: 43 y.o. female in NAD Cardio: RRR no m/r/g Lungs: CTAB, no wheezing, no rhonchi, no crackles, no IWOB on RA Abdomen: midline abscess expressing purulent fluid in skin fold of panus and mons pubis, see below, very TTP and unable to examine further given pain GU: psoriatic plaque on bilateral upper legs and mildly erythematous, no necrosis seen in perineal area, but difficult to assess due to patient cooperation 2/2 pain from left buttocks abscess Skin: warm and dry      Laboratory: Recent Labs  Lab 05/17/19 0640 05/17/19 1631  WBC 14.4* 16.2*  HGB 8.9* 9.3*  HCT 28.6* 29.1*  PLT 489* 482*   Recent Labs  Lab 05/17/19 0640 05/17/19 1631  NA 127*  133*  K 4.5 4.6  CL 95* 102  CO2 22 21*  BUN 15 12  CREATININE 1.01* 1.19*  CALCIUM 8.6* 8.7*  PROT 6.7  --   BILITOT 0.2*  --   ALKPHOS 291*  --   ALT 14  --   AST 10*  --   GLUCOSE 588* 263*     Imaging/Diagnostic Tests: Ct Pelvis W Contrast  Result Date: 05/17/2019 CLINICAL DATA:  Left buttock abscess EXAM: CT PELVIS WITH CONTRAST TECHNIQUE: Multidetector CT imaging of the pelvis was performed using the standard protocol following the bolus administration of intravenous contrast. CONTRAST:  133m ISOVUE-300 COMPARISON:  None. FINDINGS: Urinary Tract: Bladder is well distended. Visualized kidneys are unremarkable. Bowel: No obstructive or inflammatory changes are seen. The appendix is within normal limits. Vascular/Lymphatic: No vascular abnormality is noted. Scattered small inguinal lymph nodes are noted likely reactive in nature given the patient's clinical history. No retroperitoneal adenopathy is seen. Reproductive:  No mass or other significant abnormality Other: Considerable inflammatory changes are noted in the left buttock with multifocal fluid attenuation areas. No large confluent abscess is identified. Associated soft tissue swelling is noted along the posterior  aspect of the left thigh. Musculoskeletal: No acute bony abnormality is noted. IMPRESSION: Inflammatory changes in the left buttock without focal confluent abscess formation. Mild reactive adenopathy in the inguinal region is noted left greater than right. Electronically Signed   By: Inez Catalina M.D.   On: 05/17/2019 12:32    [place], Bernita Raisin, DO 05/18/2019, 8:10 AM PGY-1, [place] Intern pager: [phone], text pages welcome

## 2019-05-17 NOTE — Progress Notes (Signed)
Marcelle Hepner [id number] Admission Data: 05/17/2019 1600 Attending Provider: Martyn Malay, MD  XEN:[id number], [name], MD Consults/ Treatment Team:   Britley Gashi is a 43 y.o. female patient admitted from PACU s/p I&D to L buttock,  Full Code, VSS - Blood pressure 107/70, pulse (!) 111, temperature 99.4 F (37.4 C), temperature source Oral, resp. rate 18, height 5\' 2"  (1.575 m), weight (!) 140 kg, SpO2 90 %., O2   , no c/o shortness of breath, no c/o chest pain, no distress noted. Tele #[id number] placed and pt is currently running:sinus tachycardia.  Patient lightly sedated from procedure, responds to voice.  Patient refused PO meds at this time due to sleepiness and requesting meds at a later time, endorsed to next RN.    Allergies:   Allergies  Allergen Reactions  . Kiwi Extract Shortness Of Breath and Swelling  . Cefepime     AKI, see records from [place] hospitalization in January 2020  . Trental [Pentoxifylline] Nausea And Vomiting  . Nubain [Nalbuphine Hcl] Other (See Comments)    "FEELS LIKE SOMETHING CRAWLING ON ME"     Past Medical History:  Diagnosis Date  . Acute osteomyelitis of ankle or foot, left ([place]) 01/31/2018  . Alveolar hypoventilation   . Anemia    not on iron pill  . Asthma   . Bipolar 2 disorder ([place])   . Carpal tunnel syndrome on right    recurrent  . Cellulitis 08/2010-08/2011  . Chronic pain   . COPD (chronic obstructive pulmonary disease) ([name])    Symbicort daily and Proventil as needed  . Costochondritis   . Depression   . Diabetes mellitus type II, uncontrolled ([place]) 2000   Type 2, Uncontrolled.Takes Lantus daily.Fasting blood sugar runs 150  . Dizziness    occasionally  . Drug-seeking behavior   . GERD (gastroesophageal reflux disease)    takes Pantoprazole and Zantac daily  . Headache    migraine-last one about a yr ago.Topamax daily  . HLD (hyperlipidemia)    takes Atorvastatin daily  . Hypertension    takes Lisinopril and Coreg daily  .  Morbid obesity ([place])   . Muscle spasm    takes Flexeril as needed  . Nocturia   . OSA on CPAP   . Peripheral neuropathy    takes Gabapentin daily  . Pneumonia    "walking" several yrs ago and as a baby (12/05/2018)  . Rectal fissure   . Restless leg   . Syncope 02/25/2016  . Urinary frequency   . Varicose veins    Right medial thigh and Left leg       Will cont to monitor and assist as needed.  Sherren Kerns, RN 05/17/2019 7:05 PM

## 2019-05-17 NOTE — ED Provider Notes (Addendum)
[facility] EMERGENCY DEPARTMENT Provider Note   CSN: [id number] Arrival date & time: 05/17/19  0554     History   Chief Complaint Chief Complaint  Patient presents with  . Abscess  . Hyperglycemia    HPI [name] is a 43 y.o. female.     The history is provided by the patient. No language interpreter was used.  Abscess Location:  Pelvis Pelvic abscess location:  Gluteal cleft Size:  15 Abscess quality: draining, painful and redness   Red streaking: no   Progression:  Worsening Pain details:    Quality:  Throbbing and aching   Severity:  Moderate   Timing:  Constant   Progression:  Worsening Chronicity:  New Relieved by:  Nothing Worsened by:  Nothing Ineffective treatments:  None tried Associated symptoms: no fever   Hyperglycemia Associated symptoms: no fever    Pt complains of severe pain from an abcess on her bottom.  Pt reports her glucose has also been high  Past Medical History:  Diagnosis Date  . Acute osteomyelitis of ankle or foot, left ([place]) 01/31/2018  . Alveolar hypoventilation   . Anemia    not on iron pill  . Asthma   . Bipolar 2 disorder ([place])   . Carpal tunnel syndrome on right    recurrent  . Cellulitis 08/2010-08/2011  . Chronic pain   . COPD (chronic obstructive pulmonary disease) ([name])    Symbicort daily and Proventil as needed  . Costochondritis   . Depression   . Diabetes mellitus type II, uncontrolled ([place]) 2000   Type 2, Uncontrolled.Takes Lantus daily.Fasting blood sugar runs 150  . Dizziness    occasionally  . Drug-seeking behavior   . GERD (gastroesophageal reflux disease)    takes Pantoprazole and Zantac daily  . Headache    migraine-last one about a yr ago.Topamax daily  . HLD (hyperlipidemia)    takes Atorvastatin daily  . Hypertension    takes Lisinopril and Coreg daily  . Morbid obesity ([place])   . Muscle spasm    takes Flexeril as needed  . Nocturia   . OSA on CPAP   . Peripheral neuropathy     takes Gabapentin daily  . Pneumonia    "walking" several yrs ago and as a baby (12/05/2018)  . Rectal fissure   . Restless leg   . Syncope 02/25/2016  . Urinary frequency   . Varicose veins    Right medial thigh and Left leg     Patient Active Problem List   Diagnosis Date Noted  . Suspected Covid-19 Virus Infection 05/11/2019  . Amputated toe, right ([place]) 04/11/2019  . 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[place]) 04/11/2019  . Hypertriglyceridemia 04/11/2019  . Type 2 diabetes mellitus with diabetic polyneuropathy, with long-term current use of insulin ([place]) 04/11/2019  . Type 2 diabetes mellitus with hyperglycemia, with long-term current use of insulin ([place]) 04/11/2019  . Infection of right foot   . Cellulitis of great toe of right foot 10/29/2018  . Osteomyelitis of great toe of right foot ([place]) 10/29/2018  . Encounter for orthopedic aftercare following surgical amputation 06/28/2018  . Uncontrolled type 2 diabetes mellitus with insulin therapy ([place]) 04/16/2018  . Amputation stump infection ([place]) 04/09/2018  . Allergic rhinitis 03/06/2018  . Unilateral complete BKA, left, sequela ([place])   . Class 3 severe obesity due to excess calories with serious comorbidity and body mass index (BMI) of 50.0 to 59.9 in adult [facility])   . Decreased pedal pulses 06/10/2017  .  Unilateral primary osteoarthritis, right knee 10/22/2016  . Primary osteoarthritis of first carpometacarpal joint of left hand 07/30/2016  . Diabetic neuropathy ([place]) 07/14/2016  . Nausea 03/17/2016  . De Quervain's tenosynovitis, bilateral 11/01/2015  . Vitamin D deficiency 09/05/2015  . Recurrent candidiasis of vagina 09/05/2015  . Varicose veins of leg with complications 59/93/5701  . Restless leg syndrome 10/17/2014  . Chronic sinusitis 07/18/2014  . Headache 07/15/2014  . Encounter for chronic pain management 06/30/2013  . HLD (hyperlipidemia) 11/19/2012  . Chest pain 06/27/2012  . Right carpal tunnel syndrome 09/01/2011  .  Bilateral knee pain 09/01/2011  . DM (diabetes mellitus) type II uncontrolled, periph vascular disorder ([place]) 05/22/2008  . Morbid obesity ([place]) 05/22/2008  . 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 Depression 05/22/2008  . Obstructive sleep apnea 05/22/2008  . Hypertension 05/22/2008  . Asthma 05/22/2008  . GERD 05/22/2008    Past Surgical History:  Procedure Laterality Date  . AMPUTATION Left 02/01/2018   Procedure: LEFT FOURTH AND 5TH TOE RAY AMPUTATION;  Surgeon: [name], MD;  Location: [facility];  Service: Orthopedics;  Laterality: Left;  . AMPUTATION Left 03/03/2018   Procedure: LEFT BELOW KNEE AMPUTATION;  Surgeon: [name], MD;  Location: [facility];  Service: Orthopedics;  Laterality: Left;  . CARPAL TUNNEL RELEASE Bilateral   . CESAREAN SECTION  2007  . KNEE ARTHROSCOPY Right 07/17/2010  . LEFT HEART CATHETERIZATION WITH CORONARY ANGIOGRAM N/A 07/27/2012   Procedure: LEFT HEART CATHETERIZATION WITH CORONARY ANGIOGRAM;  Surgeon: Sherren Mocha, MD;  [facility] C[facility] LAB;  Service: Cardiovascular;  Laterality: N/A;  . MASS EXCISION N/A 06/29/2013   Procedure:  WIDE LOCAL EXCISION OF POSTERIOR NECK ABSCESS;  Surgeon: Ralene Ok, MD;  Location: [facility];  Service: General;  Laterality: N/A;  . REPAIR KNEE LIGAMENT Left    "fixed ligaments and chipped patella"     OB History    Gravida  2   Para  1   Term      Preterm      AB  1   Living  1     SAB  1   TAB      Ectopic      Multiple      Live Births               Home Medications    Prior to Admission medications   Medication Sig Start Date End Date Taking? Authorizing Provider  ACCU-CHEK SOFTCLIX LANCETS lancets Use as instructed to check blood sugar 3 times per day Patient taking differently: Use as instructed to check blood sugar 2 times per day 03/16/18   Angiulli, Lavon Paganini, PA-C  atorvastatin (LIPITOR) 20 MG tablet Take 1 tablet (20 mg total) by mouth daily. 03/16/18   Angiulli, Lavon Paganini, PA-C  Blood Glucose Monitoring Suppl (ACCU-CHEK NANO SMARTVIEW) w/Device KIT Use to check blood sugar 3 times daily 03/16/18   Angiulli, Lavon Paganini, PA-C  budesonide-formoterol (SYMBICORT) 160-4.5 MCG/ACT inhaler inhale 2 PUFFS into THE lungs 2 TIMES DAILY Patient taking differently: Inhale 2 puffs into the lungs 2 (two) times daily.  10/10/18   [name], MD  Cholecalciferol 1000 units tablet Take 1 tablet (1,000 Units total) by mouth daily. 04/05/17   [name], MD  cyclobenzaprine (FLEXERIL) 5 MG tablet TAKE 1 TABLET BY MOUTH 3 TIMES DAILY AS NEEDED FOR MUSCLE SPASMS 05/09/19   [name], MD  gabapentin (NEURONTIN) 600 MG tablet TAKE 2 TABLETS BY  MOUTH 3 TIMES DAILY Patient taking differently: Take 1,200 mg by mouth 3 (three) times daily.  04/26/19   [name], MD  glucose blood (ACCU-CHEK GUIDE) test strip USE T0 CHECK BLOOD SUGAR 3 TIMES DAILY 03/16/18   Angiulli, Lavon Paganini, PA-C  HYDROcodone-acetaminophen (NORCO) 10-325 MG tablet Take 1 tablet by mouth every 8 (eight) hours as needed. 04/19/19   [name], MD  HYDROcodone-acetaminophen [facility]) 10-325 MG tablet Take 1 tablet by mouth every 8 (eight) hours as needed. Patient taking differently: Take 1 tablet by mouth every 8 (eight) hours.  04/19/19   [name], MD  HYDROcodone-acetaminophen [facility]) 10-325 MG tablet Take 1 tablet by mouth every 8 (eight) hours as needed. Patient not taking: Reported on 05/08/2019 04/19/19   [name], MD  Insulin Isophane & Regular Human (HUMULIN 70/30 MIX) (70-30) 100 UNIT/ML PEN Inject 70 Units into the skin daily with breakfast AND 35 Units daily with supper. 04/11/19   Shamleffer, Melanie Crazier, MD  NYSTATIN powder Apply 1 Bottle topically 4 (four) times daily.  03/30/19   [provider]  pantoprazole (PROTONIX) 40 MG tablet Take 1 tablet (40 mg total) by mouth daily. 03/16/18   Angiulli, Lavon Paganini, PA-C  PROVENTIL HFA 108 (90 Base) MCG/ACT  inhaler Inhale 1-2 puffs into the lungs every 6 (six) hours as needed for wheezing or shortness of breath. 05/11/19   [name], MD    Family History Family History  Problem Relation Age of Onset  . Diabetes Mother   . Hyperlipidemia Mother   . Depression Mother   . Varicose Veins Mother   . Heart attack Paternal Uncle   . Heart disease Paternal Grandmother   . Heart attack Paternal Grandmother   . Heart attack Paternal Grandfather   . Heart disease Paternal Grandfather   . Heart attack Father   . Cancer Maternal Grandmother        COLON  . Hypertension Maternal Grandmother   . Hyperlipidemia Maternal Grandmother   . Diabetes Maternal Grandmother   . Other Maternal Grandfather        GUN SHOT    Social History Social History   Tobacco Use  . Smoking status: Former Smoker    Packs/day: 0.30    Years: 0.30    Pack years: 0.09    Types: Cigarettes    Quit date: 12/06/1993    Years since quitting: 25.4  . Smokeless tobacco: Never Used  Substance Use Topics  . Alcohol use: No    Alcohol/week: 0.0 standard drinks  . Drug use: Yes    Comment: OD attempts on home meds       Allergies   Kiwi extract, Cefepime, Trental [pentoxifylline], and Nubain [nalbuphine hcl]   Review of Systems Review of Systems  Constitutional: Negative for fever.  Skin: Positive for wound.  All other systems reviewed and are negative.    Physical Exam Updated Vital Signs BP 107/61   Pulse 95   Temp 98.8 F (37.1 C) (Oral)   Resp (!) 23   Ht '5\' 2"'  (1.575 m)   Wt (!) 140 kg   SpO2 95%   BMI 56.45 kg/m   Physical Exam Vitals signs reviewed.  HENT:     Head: Normocephalic.     Mouth/Throat:     Mouth: Mucous membranes are moist.  Eyes:     Pupils: Pupils are equal, round, and reactive to light.  Neck:     Musculoskeletal: Normal range of  motion.  Cardiovascular:     Rate and Rhythm: Normal rate.     Pulses: Normal pulses.  Pulmonary:     Effort: Pulmonary effort is  normal.  Abdominal:     General: Abdomen is flat.  Musculoskeletal: Normal range of motion.     Comments: Large swollen firm area  Buttock   Skin:    General: Skin is warm.  Neurological:     General: No focal deficit present.     Mental Status: She is alert.  Psychiatric:        Mood and Affect: Mood normal.      ED Treatments / Results  Labs (all labs ordered are listed, but only abnormal results are displayed) Labs Reviewed  COMPREHENSIVE METABOLIC PANEL - Abnormal; Notable for the following components:      Result Value   Sodium 127 (*)    Chloride 95 (*)    Glucose, Bld 588 (*)    Creatinine, Ser 1.01 (*)    Calcium 8.6 (*)    Albumin 1.7 (*)    AST 10 (*)    Alkaline Phosphatase 291 (*)    Total Bilirubin 0.2 (*)    All other components within normal limits  CBC WITH DIFFERENTIAL/PLATELET - Abnormal; Notable for the following components:   WBC 14.4 (*)    RBC 3.14 (*)    Hemoglobin 8.9 (*)    HCT 28.6 (*)    Platelets 489 (*)    Neutro Abs 10.0 (*)    Monocytes Absolute 1.2 (*)    Abs Immature Granulocytes 0.50 (*)    All other components within normal limits  CULTURE, BLOOD (ROUTINE X 2)  CULTURE, BLOOD (ROUTINE X 2)  SARS CORONAVIRUS 2 (HOSPITAL ORDER, [place])  I-STAT BETA HCG BLOOD, ED ([facility], [facility], [facility] ONLY)    EKG    Radiology No results found.  Procedures Procedures (including critical care time)  Medications Ordered in ED Medications  vancomycin (VANCOCIN) 2,000 mg in sodium chloride 0.9 % 500 mL IVPB (has no administration in time range)  sodium chloride 0.9 % bolus 1,000 mL (has no administration in time range)    Followed by  sodium chloride 0.9 % bolus 1,000 mL (has no administration in time range)    Followed by  0.9 %  sodium chloride infusion (has no administration in time range)  dextrose 5 %-0.45 % sodium chloride infusion (has no administration in time range)  insulin regular, human (MYXREDLIN) 100  units/ 100 mL infusion (has no administration in time range)     Initial Impression / Assessment and Plan / ED Course  I have reviewed the triage vital signs and the nursing notes.  Pertinent labs & imaging results that were available during my care of the patient were reviewed by me and considered in my medical decision making (see chart for details).       MDM  Pt has elevated glucose to 588 wbc of 14.4,    Pt is allergic to cefepime.  I ordered vancomycin IV. Pt is followed by [facility].  I will consult for admission.  Family Practice will see here.  I will consult General Surgery for evaluation.  Jackson Latino  PA with [facility]  will see here for evaluation  Final Clinical Impressions(s) / ED Diagnoses   Final diagnoses:  Abscess of buttock, right  Hyperglycemia    ED Discharge Orders    None  Fransico Meadow, PA-C 05/17/19 0912    Fransico Meadow, PA-C 05/17/19 1010    Lacretia Leigh, MD 05/22/19 [phone]

## 2019-05-17 NOTE — ED Notes (Signed)
Pt in CT, will obtain CBG upon return

## 2019-05-17 NOTE — H&P (Addendum)
[facility] Admission History and Physical Service Pager: [phone]  Patient name: [name] Medical record number: [id number] Date of birth: December 18, 1975 Age: 43 y.o. Gender: female  Primary Care Provider: [name], MD Consultants: General surgery Code Status: Full Preferred Emergency Contact: Olam Idler Sherle Poe, [phone]  Chief Complaint: Right buttock pain  Assessment and Plan: Amar Keenum is a 43 y.o. female presenting with right buttock abscess and severely elevated blood glucose. PMH is significant for left BKA, amputation all 5 digits right foot, type 2 diabetes with neuropathy, allergic rhinitis, morbid obesity, GERD, right carpal tunnel syndrome, left hand osteoarthritis, obesity hypoventilation syndrome   Right buttock abscess Very large right buttock abscess.  Impossible to assess depth given tenderness to palpation and vast size of the abscess.  On exam very indurated and erythematous.  Started on IV vancomycin.  Given patient's uncontrolled diabetes Pseudomonas is obviously a consideration which not covered by the vanc.  Would likely benefit from Zosyn, agree with continuing Vanco for MRSA coverage.  Will hold off on starting any additional antibiotics until surgical evaluation.  Pending surgery input could likely benefit from I&D in the OR. -Admit to inpatient family medicine, Dr. Owens Shark, stepdown unit -Surgery consulted by ED, appreciate their input -If not surgical candidate will add on Zosyn for Pseudomonas coverage -If undergo surgery will follow culture data -Holding Lovenox for now for potential surgery, SCDs -N.p.o. sips with meds -Morphine 2 mg every 4 hours as needed for pain control -Switch p.o. Protonix to IV -Normal saline at 125 mL/h, discussed below  Hyperglycemia Blood sugar greater than 550 on admission.  Insulin drip started by ED.  No signs, symptoms, or laboratory findings consistent with DKA.  Could likely  stop insulin drip and switch to Lantus with sliding scale.  Given that patient is now n.p.o. and is possible surgical candidate we will continue insulin drip for now.  Takes insulin 70/30 70 units in a.m., 35 units in p.m.  Can likely switch to have roughly half of this be long and short acting when off of insulin drip. -Continue insulin drip, can transition off when surgical decision is made -Needs stepdown unit secondary to drip -Every hour CBGs -Switch to dextrose containing fluid when CBG less than 250 -Hemoglobin A1c  Morbid obesity BMI 56.45, likely the cause of the patient's numerous comorbidities.  Needs dramatic intervention at this point, given amputations physical activity will be limited.  Needs dramatic dietary intervention.  Follow-up with PCP as outpatient  Bilateral amputations Left BKA, all toes amputated from right foot.  These wound sites are clean dry and intact, well-healed.  Obesity hypoventilation syndrome Per chart review does not have diagnosis of sleep apnea, but given body habitus likely experiencing symptoms.  Could likely benefit from sleep study as an outpatient.  Asthma Well-controlled.  Takes Symbicort and albuterol as needed for symptoms.  We will switch the Symbicort to [facility] for inpatient formulary.  Diabetic neuropathy Takes gabapentin 1200 mg 3 times daily.  Will hold while n.p.o.  Can restart when tolerating p.o.  Her neuropathy likely the cause of multiple ulcerations which ultimately claimed her left leg and right toes.  psuedohyponatremia 2/2 hyperglycemia. Na 135 when correction factor is used.  Elevated alk phos Likely 2/2 inflammaiton from abscess. Significantly elevated above baseline of ~100 back in April 2019. Was 193 on 6/2. Could consider RUQ [place] or perhaps GGT to evaluate etiology, but will defer this workup for now.  Severe protein calorie malnutrition Alb  1.7. Add ensure shakes when no longer npo.  Anemia Hemoglobin 8.9.  MCV 92.   Likely dilutional secondary to 2 L fluid received in ED.  Was 10.9 back on 6/2.  FEN/GI: N.p.o. sips with meds Prophylaxis: SCDs, can add Lovenox if no surgery  Disposition: Likely home  History of Present Illness:  [name] is a 43 y.o. female presenting with 2 to 3-day history of right buttock pain.  Per patient report the pain comes and goes but eventually became too severe to handle overnight on 6/10.  She came to the [facility] emergency department in the early morning of 6/11.  She states that she can feel an abscess forming but try to see if it would "go away on its own".  She denies any fevers or other symptoms.  Also note patient presented to the ED with blood glucose over 500.  She is supposed to take 70/3070 units in the morning, 35 units at night.  States that she has been taking these as prescribed and is unsure why her blood sugars are high.  States that she has not had much appetite recently and has not been eating sweet foods.  She voices frustration that her sugars are always well controlled while she is at the hospital, but not at home.  Work-up in the ED consisted of a CMP, CBC, Urine Preg, blood culture x2.  PE significant for sodium 127, glucose 588, alk phos 291, albumin 1.7, creatinine 1.01.  CBC significant for white white blood cell count 14.4, platelet 489, hemoglobin 8.9.  Surgery was consulted by the emergency department to assess for need of I&D.  Patient was started on insulin drip for blood sugars.  She started on vancomycin given that she has an allergy to cefepime.  She was given two 1 L normal saline boluses, and hydromorphone 1 mg x 1 for pain.  Review Of Systems: Per HPI with the following additions:   Review of Systems  Constitutional: Negative for chills and fever.  HENT: Negative for congestion.   Respiratory: Negative for cough and hemoptysis.   Cardiovascular: Negative for chest pain and palpitations.  Genitourinary: Positive for frequency and  urgency.  Musculoskeletal: Negative for myalgias and neck pain.  Neurological: Negative for dizziness and headaches.    Patient Active Problem List   Diagnosis Date Noted   Abscess of right buttock 05/17/2019   Suspected Covid-19 Virus Infection 05/11/2019   Amputated toe, right ([place]) 04/11/2019   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[place]) 04/11/2019   Hypertriglyceridemia 04/11/2019   Type 2 diabetes mellitus with diabetic polyneuropathy, with long-term current use of insulin ([place]) 04/11/2019   Type 2 diabetes mellitus with hyperglycemia, with long-term current use of insulin ([place]) 04/11/2019   Infection of right foot    Cellulitis of great toe of right foot 10/29/2018   Osteomyelitis of great toe of right foot ([place]) 10/29/2018   Encounter for orthopedic aftercare following surgical amputation 06/28/2018   Uncontrolled type 2 diabetes mellitus with insulin therapy ([place]) 04/16/2018   Amputation stump infection ([place]) 04/09/2018   Allergic rhinitis 03/06/2018   Unilateral complete BKA, left, sequela ([place])    Class 3 severe obesity due to excess calories with serious comorbidity and body mass index (BMI) of 50.0 to 59.9 in adult [facility])    Decreased pedal pulses 06/10/2017   Unilateral primary osteoarthritis, right knee 10/22/2016   Primary osteoarthritis of first carpometacarpal joint of left hand 07/30/2016   Diabetic neuropathy ([place]) 07/14/2016   Nausea 03/17/2016  De Quervain's tenosynovitis, bilateral 11/01/2015   Vitamin D deficiency 09/05/2015   Recurrent candidiasis of vagina 09/05/2015   Varicose veins of leg with complications 93/90/3009   Restless leg syndrome 10/17/2014   Chronic sinusitis 07/18/2014   Headache 07/15/2014   Encounter for chronic pain management 06/30/2013   HLD (hyperlipidemia) 11/19/2012   Chest pain 06/27/2012   Right carpal tunnel syndrome 09/01/2011   Bilateral knee pain 09/01/2011   DM (diabetes mellitus) type II uncontrolled,  periph vascular disorder ([place]) 05/22/2008   Morbid obesity ([place]) 05/22/2008   OBESITY HYPOVENTILATION SYNDROME 05/22/2008   Depression 05/22/2008   Obstructive sleep apnea 05/22/2008   Hypertension 05/22/2008   Asthma 05/22/2008   GERD 05/22/2008    Past Medical History: Past Medical History:  Diagnosis Date   Acute osteomyelitis of ankle or foot, left ([place]) 01/31/2018   Alveolar hypoventilation    Anemia    not on iron pill   Asthma    Bipolar 2 disorder ([name])    Carpal tunnel syndrome on right    recurrent   Cellulitis 08/2010-08/2011   Chronic pain    COPD (chronic obstructive pulmonary disease) ([name])    Symbicort daily and Proventil as needed   Costochondritis    Depression    Diabetes mellitus type II, uncontrolled ([place]) 2000   Type 2, Uncontrolled.Takes Lantus daily.Fasting blood sugar runs 150   Dizziness    occasionally   Drug-seeking behavior    GERD (gastroesophageal reflux disease)    takes Pantoprazole and Zantac daily   Headache    migraine-last one about a yr ago.Topamax daily   HLD (hyperlipidemia)    takes Atorvastatin daily   Hypertension    takes Lisinopril and Coreg daily   Morbid obesity ([name])    Muscle spasm    takes Flexeril as needed   Nocturia    OSA on CPAP    Peripheral neuropathy    takes Gabapentin daily   Pneumonia    "walking" several yrs ago and as a baby (12/05/2018)   Rectal fissure    Restless leg    Syncope 02/25/2016   Urinary frequency    Varicose veins    Right medial thigh and Left leg     Past Surgical History: Past Surgical History:  Procedure Laterality Date   AMPUTATION Left 02/01/2018   Procedure: LEFT FOURTH AND 5TH TOE RAY AMPUTATION;  Surgeon: [name], MD;  Location: [place];  Service: Orthopedics;  Laterality: Left;   AMPUTATION Left 03/03/2018   Procedure: LEFT BELOW KNEE AMPUTATION;  Surgeon: [name], MD;  Location: [place];  Service: Orthopedics;  Laterality:  Left;   CARPAL TUNNEL RELEASE Bilateral    CESAREAN SECTION  2007   KNEE ARTHROSCOPY Right 07/17/2010   LEFT HEART CATHETERIZATION WITH CORONARY ANGIOGRAM N/A 07/27/2012   Procedure: LEFT HEART CATHETERIZATION WITH CORONARY ANGIOGRAM;  Surgeon: Sherren Mocha, MD;  Location: [facility] C[facility] LAB;  Service: Cardiovascular;  Laterality: N/A;   MASS EXCISION N/A 06/29/2013   Procedure:  WIDE LOCAL EXCISION OF POSTERIOR NECK ABSCESS;  Surgeon: Ralene Ok, MD;  Location: [place];  Service: General;  Laterality: N/A;   REPAIR KNEE LIGAMENT Left    "fixed ligaments and chipped patella"    Social History: Social History   Tobacco Use   Smoking status: Former Smoker    Packs/day: 0.30    Years: 0.30    Pack years: 0.09    Types: Cigarettes    Quit date: 12/06/1993  Years since quitting: 25.4   Smokeless tobacco: Never Used  Substance Use Topics   Alcohol use: No    Alcohol/week: 0.0 standard drinks   Drug use: Yes    Comment: OD attempts on home meds     Additional social history:   Please also refer to relevant sections of EMR.  Family History: Family History  Problem Relation Age of Onset   Diabetes Mother    Hyperlipidemia Mother    Depression Mother    Varicose Veins Mother    Heart attack Paternal Uncle    Heart disease Paternal Grandmother    Heart attack Paternal Grandmother    Heart attack Paternal Grandfather    Heart disease Paternal Grandfather    Heart attack Father    Cancer Maternal Grandmother        COLON   Hypertension Maternal Grandmother    Hyperlipidemia Maternal Grandmother    Diabetes Maternal Grandmother    Other Maternal Grandfather        GUN SHOT    Allergies and Medications: Allergies  Allergen Reactions   Kiwi Extract Shortness Of Breath and Swelling   Cefepime     AKI, see records from [place] hospitalization in January 2020   Trental [Pentoxifylline] Nausea And Vomiting   Nubain [Nalbuphine Hcl] Other (See Comments)      "FEELS LIKE SOMETHING CRAWLING ON ME"   No current facility-administered medications on file prior to encounter.    Current Outpatient Medications on File Prior to Encounter  Medication Sig Dispense Refill   atorvastatin (LIPITOR) 20 MG tablet Take 1 tablet (20 mg total) by mouth daily. 30 tablet 5   budesonide-formoterol (SYMBICORT) 160-4.5 MCG/ACT inhaler inhale 2 PUFFS into THE lungs 2 TIMES DAILY (Patient taking differently: Inhale 2 puffs into the lungs 2 (two) times daily. ) 10.2 g 4   Cholecalciferol 1000 units tablet Take 1 tablet (1,000 Units total) by mouth daily. 90 tablet 0   cyclobenzaprine (FLEXERIL) 5 MG tablet TAKE 1 TABLET BY MOUTH 3 TIMES DAILY AS NEEDED FOR MUSCLE SPASMS (Patient taking differently: Take 5 mg by mouth 3 (three) times daily as needed for muscle spasms. ) 30 tablet 1   gabapentin (NEURONTIN) 600 MG tablet TAKE 2 TABLETS BY MOUTH 3 TIMES DAILY (Patient taking differently: Take 1,200 mg by mouth 3 (three) times daily. ) 180 tablet 3   HYDROcodone-acetaminophen (NORCO) 10-325 MG tablet Take 1 tablet by mouth every 8 (eight) hours as needed. (Patient taking differently: Take 1 tablet by mouth every 8 (eight) hours. ) 90 tablet 0   Insulin Isophane & Regular Human (HUMULIN 70/30 MIX) (70-30) 100 UNIT/ML PEN Inject 70 Units into the skin daily with breakfast AND 35 Units daily with supper. 30 mL 11   NYSTATIN powder Apply 1 Bottle topically daily as needed (yeast).      pantoprazole (PROTONIX) 40 MG tablet Take 1 tablet (40 mg total) by mouth daily. 30 tablet 1   PROVENTIL HFA 108 (90 Base) MCG/ACT inhaler Inhale 1-2 puffs into the lungs every 6 (six) hours as needed for wheezing or shortness of breath. (Patient taking differently: Inhale 2 puffs into the lungs every 4 (four) hours as needed for wheezing. ) 6.7 g 5   ACCU-CHEK SOFTCLIX LANCETS lancets Use as instructed to check blood sugar 3 times per day (Patient taking differently: Use as instructed to  check blood sugar 2 times per day) 100 each 12   Blood Glucose Monitoring Suppl (ACCU-CHEK NANO SMARTVIEW) w/Device KIT Use  to check blood sugar 3 times daily 1 kit 0   glucose blood (ACCU-CHEK GUIDE) test strip USE T0 CHECK BLOOD SUGAR 3 TIMES DAILY 100 each 5   HYDROcodone-acetaminophen (NORCO) 10-325 MG tablet Take 1 tablet by mouth every 8 (eight) hours as needed. (Patient not taking: Reported on 05/17/2019) 90 tablet 0   HYDROcodone-acetaminophen (NORCO) 10-325 MG tablet Take 1 tablet by mouth every 8 (eight) hours as needed. (Patient not taking: Reported on 05/08/2019) 90 tablet 0    Objective: BP 116/60    Pulse 80    Temp 98.8 F (37.1 C) (Oral)    Resp 18    Ht _0  (1.575 m)    Wt (!) 140 kg    SpO2 98%    BMI 56.45 kg/m  Exam: General: 42 year old morbidly obese Caucasian female, mild distress secondary to buttock pain Eyes: EOMI, PERRLA Cardiovascular: Regular rate and rhythm, no M/R/G Respiratory: Lungs clear to auscultation bilaterally Gastrointestinal: Soft, nontender, distended MSK: Left BKA, all 5 digits right foot amputated.  Skin warm and dry Neuro: CN II through XII intact, no focal neurologic deficit Psych: Pleasant, appropriate Buttock: Very large, indurated, erythematous abscess noted, essentially covering entirety of right buttock.  Very tender to palpation, very firm       Labs and Imaging: CBC BMET  Recent Labs  Lab 05/17/19 0640  WBC 14.4*  HGB 8.9*  HCT 28.6*  PLT 489*   Recent Labs  Lab 05/17/19 0640  NA 127*  K 4.5  CL 95*  CO2 22  BUN 15  CREATININE 1.01*  GLUCOSE 588*  CALCIUM 8.6Guadalupe Dawn, MD 05/17/2019, 9:56 AM PGY-, [facility] pager: [phone], text pages welcome

## 2019-05-17 NOTE — Progress Notes (Signed)
Anesthesia and surgeon made aware of hcg 2.

## 2019-05-17 NOTE — Op Note (Signed)
Preop diagnosis: Large left buttock abscess Postop diagnosis: Left buttock abscess involving skin, subcutaneous tissue, and muscle (15 x 10 x 5 cm) Procedure performed: Incision and sharp debridement using cautery and scalpel of left buttock abscess Surgeon:Demitri Kucinski K Jamiya Nims Anesthesia: General endotracheal Indications: This is a 43 year old female with super morbid obesity who presents with a very large left buttock abscess and severe hyperglycemia with CBG greater than 500.  She has had worsening symptoms over the last week.  She came to the emergency department today and was found to have this large abscess.  She has begun to have some purulent drainage.  She presents now for emergent debridement.  ASA 3E Description of procedure: The patient is brought to the operating room and placed in the supine position on the operating room table.  After an adequate level of general anesthesia was obtained, her legs were placed in lithotomy position in yellowfin stirrups.  Her entire perineum and upper thighs were prepped with Betadine and draped sterile fashion.  A timeout was taken to ensure the proper patient and proper procedure.  I inserted cotton swabs into the spontaneous opening of the abscess cavity for microbiology cultures.  The abscess tracked both posteriorly and anteriorly.  This does not seem to involve the perianal region.  I then use cautery to excise the skin and subcutaneous tissue over the abscess.  The abscess is fairly large extending anterior posterior with a measurement of 15 cm.  We excised approximately 10 cm transversely.  We encountered a lot of necrotic tissue as well as a large abscess.  We debrided the necrotic tissue with cautery.  Cautery was used for hemostasis.  The wound seems to track anteriorly towards the left groin.  We opened up the skin in this area as well.  The abscess also tracked posteriorly but we are able to debride down to the edge of the abscess cavity.  I then used 4  L of sterile saline and pulse lavage to thoroughly irrigate the entire wound.  No other remaining necrotic tissue appeared to be left in place.  No further purulent fluid was noted.  The wound was packed with almost an entire roll of saline moistened Kerlix.  A dry dressing was applied.  The patient was placed back in a supine position.  She was extubated and brought to the recovery room in stable condition.  All sponge, instrument, and needle counts are correct.  Imogene Burn. Georgette Dover, MD, [place] [facility] Beeper [phone]  05/17/2019 2:39 PM

## 2019-05-17 NOTE — ED Notes (Signed)
cbg 500

## 2019-05-17 NOTE — Progress Notes (Signed)
Pharmacy Antibiotic Note  [name] is a 43 y.o. female admitted on 05/17/2019 with cellulitis. Pharmacy has been consulted for vancomycin dosing. Pt is afebrile but WBC is elevated at 14.4. SCr is WNL at 1.01.   Plan: Vancomycin 2gm IV x 1 then 1750mg  IV Q24H F/u renal fxn, C&S, clinical status and peak/trough at SS  Height: 5\' 2"  (157.5 cm) Weight: (!) 308 lb 10.3 oz (140 kg) IBW/kg (Calculated) : 50.1  Temp (24hrs), Avg:98.8 F (37.1 C), Min:98.8 F (37.1 C), Max:98.8 F (37.1 C)  Recent Labs  Lab 05/17/19 0640  WBC 14.4*    Estimated Creatinine Clearance: 94.9 mL/min (A) (by C-G formula based on SCr of 1.06 mg/dL (H)).    Allergies  Allergen Reactions  . Kiwi Extract Shortness Of Breath and Swelling  . Cefepime     AKI, see records from [place] hospitalization in January 2020  . Trental [Pentoxifylline] Nausea And Vomiting  . Nubain [Nalbuphine Hcl] Other (See Comments)    "FEELS LIKE SOMETHING CRAWLING ON ME"    Antimicrobials this admission: Vanc 6/11>>  Dose adjustments this admission: N/A  Microbiology results: Pending  Thank you for allowing pharmacy to be a part of this patient's care.  Safina Huard, Rande Lawman 05/17/2019 7:31 AM

## 2019-05-18 ENCOUNTER — Encounter (HOSPITAL_COMMUNITY)
Admission: EM | Disposition: A | Discharge status: Home Health | Payer: Self-pay | Source: Home / Self Care | Attending: Family Medicine

## 2019-05-18 ENCOUNTER — Encounter (HOSPITAL_COMMUNITY): Payer: Self-pay | Admitting: Surgery

## 2019-05-18 ENCOUNTER — Inpatient Hospital Stay (HOSPITAL_COMMUNITY): Payer: Medicaid Other | Admitting: Certified Registered"

## 2019-05-18 DIAGNOSIS — E1165 Type 2 diabetes mellitus with hyperglycemia: Secondary | ICD-10-CM

## 2019-05-18 DIAGNOSIS — L02211 Cutaneous abscess of abdominal wall: Secondary | ICD-10-CM

## 2019-05-18 DIAGNOSIS — Z794 Long term (current) use of insulin: Secondary | ICD-10-CM

## 2019-05-18 DIAGNOSIS — R509 Fever, unspecified: Secondary | ICD-10-CM

## 2019-05-18 DIAGNOSIS — E1140 Type 2 diabetes mellitus with diabetic neuropathy, unspecified: Secondary | ICD-10-CM | POA: Diagnosis not present

## 2019-05-18 HISTORY — PX: INCISION AND DRAINAGE PERIRECTAL ABSCESS: SHX1804

## 2019-05-18 LAB — CBC WITH DIFFERENTIAL/PLATELET
Abs Immature Granulocytes: 0 10*3/uL (ref 0.00–0.07)
Basophils Absolute: 0 10*3/uL (ref 0.0–0.1)
Basophils Relative: 0 %
Eosinophils Absolute: 0.3 10*3/uL (ref 0.0–0.5)
Eosinophils Relative: 2 %
HCT: 20.9 % — ABNORMAL LOW (ref 36.0–46.0)
Hemoglobin: 6.3 g/dL — CL (ref 12.0–15.0)
Lymphocytes Relative: 11 %
Lymphs Abs: 1.7 10*3/uL (ref 0.7–4.0)
MCH: 29.2 pg (ref 26.0–34.0)
MCHC: 30.1 g/dL (ref 30.0–36.0)
MCV: 96.8 fL (ref 80.0–100.0)
Monocytes Absolute: 0.5 10*3/uL (ref 0.1–1.0)
Monocytes Relative: 3 %
Neutro Abs: 12.9 10*3/uL — ABNORMAL HIGH (ref 1.7–7.7)
Neutrophils Relative %: 84 %
Platelets: 376 10*3/uL (ref 150–400)
RBC: 2.16 MIL/uL — ABNORMAL LOW (ref 3.87–5.11)
RDW: 15.2 % (ref 11.5–15.5)
WBC: 15.4 10*3/uL — ABNORMAL HIGH (ref 4.0–10.5)
nRBC: 0 % (ref 0.0–0.2)
nRBC: 0 /100 WBC

## 2019-05-18 LAB — GLUCOSE, CAPILLARY
Glucose-Capillary: 115 mg/dL — ABNORMAL HIGH (ref 70–99)
Glucose-Capillary: 160 mg/dL — ABNORMAL HIGH (ref 70–99)
Glucose-Capillary: 194 mg/dL — ABNORMAL HIGH (ref 70–99)
Glucose-Capillary: 208 mg/dL — ABNORMAL HIGH (ref 70–99)
Glucose-Capillary: 210 mg/dL — ABNORMAL HIGH (ref 70–99)
Glucose-Capillary: 246 mg/dL — ABNORMAL HIGH (ref 70–99)
Glucose-Capillary: 258 mg/dL — ABNORMAL HIGH (ref 70–99)
Glucose-Capillary: 273 mg/dL — ABNORMAL HIGH (ref 70–99)
Glucose-Capillary: 275 mg/dL — ABNORMAL HIGH (ref 70–99)
Glucose-Capillary: 306 mg/dL — ABNORMAL HIGH (ref 70–99)
Glucose-Capillary: 451 mg/dL — ABNORMAL HIGH (ref 70–99)
Glucose-Capillary: 87 mg/dL (ref 70–99)

## 2019-05-18 LAB — CBC
HCT: 24.1 % — ABNORMAL LOW (ref 36.0–46.0)
Hemoglobin: 7.4 g/dL — ABNORMAL LOW (ref 12.0–15.0)
MCH: 28.6 pg (ref 26.0–34.0)
MCHC: 30.7 g/dL (ref 30.0–36.0)
MCV: 93.1 fL (ref 80.0–100.0)
Platelets: 451 10*3/uL — ABNORMAL HIGH (ref 150–400)
RBC: 2.59 MIL/uL — ABNORMAL LOW (ref 3.87–5.11)
RDW: 14.9 % (ref 11.5–15.5)
WBC: 17.7 10*3/uL — ABNORMAL HIGH (ref 4.0–10.5)
nRBC: 0 % (ref 0.0–0.2)

## 2019-05-18 LAB — HEMOGLOBIN A1C
Hgb A1c MFr Bld: 17.5 % — ABNORMAL HIGH (ref 4.8–5.6)
Mean Plasma Glucose: 455.55 mg/dL

## 2019-05-18 LAB — PREPARE RBC (CROSSMATCH)

## 2019-05-18 SURGERY — INCISION AND DRAINAGE, ABSCESS, PERIRECTAL
Anesthesia: General | Site: Abdomen | Laterality: Left

## 2019-05-18 MED ORDER — INSULIN ASPART 100 UNIT/ML ~~LOC~~ SOLN: 0.0000 [IU] | SUBCUTANEOUS | Status: DC

## 2019-05-18 MED ORDER — SUCCINYLCHOLINE CHLORIDE 20 MG/ML IJ SOLN
INTRAMUSCULAR | Status: DC | PRN
  Administered 2019-05-18: 160 mg via INTRAVENOUS

## 2019-05-18 MED ORDER — ACETAMINOPHEN 10 MG/ML IV SOLN: 1000.0000 mg | Freq: Once | INTRAVENOUS | Status: DC | PRN

## 2019-05-18 MED ORDER — MIDAZOLAM HCL 2 MG/2ML IJ SOLN
INTRAMUSCULAR | Status: AC
  Filled 2019-05-18: qty 2

## 2019-05-18 MED ORDER — VANCOMYCIN VARIABLE DOSE PER UNSTABLE RENAL FUNCTION (PHARMACIST DOSING): Status: DC

## 2019-05-18 MED ORDER — HYDROCODONE-ACETAMINOPHEN 7.5-325 MG PO TABS: 1.0000 | ORAL_TABLET | Freq: Once | ORAL | Status: DC | PRN

## 2019-05-18 MED ORDER — SUCCINYLCHOLINE CHLORIDE 200 MG/10ML IV SOSY
PREFILLED_SYRINGE | INTRAVENOUS | Status: AC
  Filled 2019-05-18: qty 10

## 2019-05-18 MED ORDER — SUGAMMADEX SODIUM 200 MG/2ML IV SOLN
INTRAVENOUS | Status: DC | PRN
  Administered 2019-05-18: 400 mg via INTRAVENOUS

## 2019-05-18 MED ORDER — PROPOFOL 10 MG/ML IV BOLUS
INTRAVENOUS | Status: AC
  Filled 2019-05-18: qty 20

## 2019-05-18 MED ORDER — SODIUM CHLORIDE 0.9% FLUSH
10.0000 mL | Freq: Two times a day (BID) | INTRAVENOUS | Status: DC
  Administered 2019-05-19 – 2019-05-23 (×2): 10 mL

## 2019-05-18 MED ORDER — SODIUM CHLORIDE 0.9% FLUSH: 10.0000 mL | INTRAVENOUS | Status: DC | PRN

## 2019-05-18 MED ORDER — ROCURONIUM BROMIDE 10 MG/ML (PF) SYRINGE
PREFILLED_SYRINGE | INTRAVENOUS | Status: DC | PRN
  Administered 2019-05-18: 50 mg via INTRAVENOUS

## 2019-05-18 MED ORDER — PROMETHAZINE HCL 25 MG/ML IJ SOLN
INTRAMUSCULAR | Status: AC
  Administered 2019-05-18: 6.25 mg via INTRAVENOUS
  Filled 2019-05-18: qty 1

## 2019-05-18 MED ORDER — DEXTROSE-NACL 5-0.45 % IV SOLN
INTRAVENOUS | Status: DC
  Administered 2019-05-18 – 2019-05-19 (×4): via INTRAVENOUS

## 2019-05-18 MED ORDER — DEXAMETHASONE SODIUM PHOSPHATE 10 MG/ML IJ SOLN
INTRAMUSCULAR | Status: AC
  Filled 2019-05-18: qty 1

## 2019-05-18 MED ORDER — SODIUM CHLORIDE 0.9 % IV SOLN
INTRAVENOUS | Status: DC
  Administered 2019-05-19 – 2019-05-21 (×4): via INTRAVENOUS

## 2019-05-18 MED ORDER — SODIUM CHLORIDE 0.9% IV SOLUTION
Freq: Once | INTRAVENOUS | Status: AC
  Administered 2019-05-19: 03:00:00 via INTRAVENOUS

## 2019-05-18 MED ORDER — ONDANSETRON HCL 4 MG/2ML IJ SOLN
INTRAMUSCULAR | Status: DC | PRN
  Administered 2019-05-18: 4 mg via INTRAVENOUS

## 2019-05-18 MED ORDER — CLINDAMYCIN PHOSPHATE 600 MG/50ML IV SOLN
600.0000 mg | Freq: Three times a day (TID) | INTRAVENOUS | Status: DC
  Administered 2019-05-18 – 2019-05-20 (×7): 600 mg via INTRAVENOUS
  Filled 2019-05-18 (×7): qty 50

## 2019-05-18 MED ORDER — MORPHINE SULFATE (PF) 2 MG/ML IV SOLN
2.0000 mg | INTRAVENOUS | Status: DC | PRN
  Administered 2019-05-18 (×2): 2 mg via INTRAVENOUS
  Filled 2019-05-18 (×2): qty 1

## 2019-05-18 MED ORDER — INSULIN REGULAR BOLUS VIA INFUSION
0.0000 [IU] | Freq: Three times a day (TID) | INTRAVENOUS | Status: DC
  Filled 2019-05-18: qty 10

## 2019-05-18 MED ORDER — INSULIN REGULAR BOLUS VIA INFUSION: 0.0000 [IU] | Freq: Three times a day (TID) | INTRAVENOUS | Status: DC | PRN

## 2019-05-18 MED ORDER — SODIUM CHLORIDE 0.9 % IV SOLN
INTRAVENOUS | Status: DC | PRN
  Administered 2019-05-18: 50 ug/min via INTRAVENOUS

## 2019-05-18 MED ORDER — DEXTROSE 50 % IV SOLN: 25.0000 mL | INTRAVENOUS | Status: DC | PRN

## 2019-05-18 MED ORDER — INSULIN REGULAR(HUMAN) IN NACL 100-0.9 UT/100ML-% IV SOLN: INTRAVENOUS | Status: DC | PRN

## 2019-05-18 MED ORDER — LACTATED RINGERS IV SOLN
INTRAVENOUS | Status: DC
  Administered 2019-05-18: 14:00:00 via INTRAVENOUS

## 2019-05-18 MED ORDER — LIDOCAINE 2% (20 MG/ML) 5 ML SYRINGE
INTRAMUSCULAR | Status: DC | PRN
  Administered 2019-05-18: 100 mg via INTRAVENOUS

## 2019-05-18 MED ORDER — PROPOFOL 10 MG/ML IV BOLUS
INTRAVENOUS | Status: DC | PRN
  Administered 2019-05-18: 200 mg via INTRAVENOUS

## 2019-05-18 MED ORDER — SODIUM CHLORIDE 0.9 % IV BOLUS: 1000.0000 mL | Freq: Once | INTRAVENOUS | Status: DC

## 2019-05-18 MED ORDER — PROMETHAZINE HCL 25 MG/ML IJ SOLN
6.2500 mg | INTRAMUSCULAR | Status: DC | PRN
  Administered 2019-05-18: 16:00:00 6.25 mg via INTRAVENOUS

## 2019-05-18 MED ORDER — ALBUTEROL SULFATE (2.5 MG/3ML) 0.083% IN NEBU
INHALATION_SOLUTION | RESPIRATORY_TRACT | Status: AC
  Administered 2019-05-18: 2.5 mg via RESPIRATORY_TRACT
  Filled 2019-05-18: qty 3

## 2019-05-18 MED ORDER — PHENYLEPHRINE 40 MCG/ML (10ML) SYRINGE FOR IV PUSH (FOR BLOOD PRESSURE SUPPORT)
PREFILLED_SYRINGE | INTRAVENOUS | Status: DC | PRN
  Administered 2019-05-18: 120 ug via INTRAVENOUS

## 2019-05-18 MED ORDER — LACTATED RINGERS IV SOLN
INTRAVENOUS | Status: DC | PRN
  Administered 2019-05-18: 14:00:00 via INTRAVENOUS

## 2019-05-18 MED ORDER — ALBUTEROL SULFATE (2.5 MG/3ML) 0.083% IN NEBU
2.5000 mg | INHALATION_SOLUTION | Freq: Once | RESPIRATORY_TRACT | Status: AC
  Administered 2019-05-18: 2.5 mg via RESPIRATORY_TRACT

## 2019-05-18 MED ORDER — HYDROMORPHONE HCL 1 MG/ML IJ SOLN: 0.2500 mg | INTRAMUSCULAR | Status: DC | PRN

## 2019-05-18 MED ORDER — FENTANYL CITRATE (PF) 250 MCG/5ML IJ SOLN
INTRAMUSCULAR | Status: DC | PRN
  Administered 2019-05-18: 100 ug via INTRAVENOUS

## 2019-05-18 MED ORDER — MORPHINE SULFATE (PF) 4 MG/ML IV SOLN
4.0000 mg | INTRAVENOUS | Status: DC | PRN
  Administered 2019-05-19 (×2): 4 mg via INTRAVENOUS
  Filled 2019-05-18 (×2): qty 1

## 2019-05-18 MED ORDER — ACETAMINOPHEN 325 MG PO TABS
650.0000 mg | ORAL_TABLET | ORAL | Status: DC
  Administered 2019-05-18 – 2019-05-25 (×34): 650 mg via ORAL
  Filled 2019-05-18 (×41): qty 2

## 2019-05-18 MED ORDER — MEPERIDINE HCL 25 MG/ML IJ SOLN: 6.2500 mg | INTRAMUSCULAR | Status: DC | PRN

## 2019-05-18 MED ORDER — 0.9 % SODIUM CHLORIDE (POUR BTL) OPTIME
TOPICAL | Status: DC | PRN
  Administered 2019-05-18: 1000 mL

## 2019-05-18 MED ORDER — FENTANYL CITRATE (PF) 250 MCG/5ML IJ SOLN
INTRAMUSCULAR | Status: AC
  Filled 2019-05-18: qty 5

## 2019-05-18 MED ORDER — ONDANSETRON HCL 4 MG/2ML IJ SOLN
INTRAMUSCULAR | Status: AC
  Filled 2019-05-18: qty 2

## 2019-05-18 MED ORDER — INSULIN REGULAR(HUMAN) IN NACL 100-0.9 UT/100ML-% IV SOLN
INTRAVENOUS | Status: DC
  Administered 2019-05-18: 2.2 [IU]/h via INTRAVENOUS
  Administered 2019-05-18: 10.4 [IU]/h via INTRAVENOUS
  Filled 2019-05-18 (×2): qty 100

## 2019-05-18 MED ORDER — LIDOCAINE 2% (20 MG/ML) 5 ML SYRINGE
INTRAMUSCULAR | Status: AC
  Filled 2019-05-18: qty 5

## 2019-05-18 SURGICAL SUPPLY — 33 items
BNDG GAUZE ELAST 4 BULKY (GAUZE/BANDAGES/DRESSINGS) ×2 IMPLANT
COVER MAYO STAND STRL (DRAPES) ×3 IMPLANT
COVER SURGICAL LIGHT HANDLE (MISCELLANEOUS) ×3 IMPLANT
COVER WAND RF STERILE (DRAPES) ×3 IMPLANT
DRAPE LAPAROTOMY 100X72X124 (DRAPES) ×4 IMPLANT
ELECT CAUTERY BLADE 6.4 (BLADE) ×3 IMPLANT
ELECT REM PT RETURN 9FT ADLT (ELECTROSURGICAL) ×3
ELECTRODE REM PT RTRN 9FT ADLT (ELECTROSURGICAL) ×1 IMPLANT
GAUZE SPONGE 4X4 12PLY STRL (GAUZE/BANDAGES/DRESSINGS) IMPLANT
GAUZE SPONGE 4X4 12PLY STRL LF (GAUZE/BANDAGES/DRESSINGS) ×2 IMPLANT
GLOVE BIO SURGEON STRL SZ7 (GLOVE) ×3 IMPLANT
GLOVE BIOGEL PI IND STRL 7.5 (GLOVE) ×1 IMPLANT
GLOVE BIOGEL PI INDICATOR 7.5 (GLOVE) ×2
GOWN STRL REUS W/ TWL LRG LVL3 (GOWN DISPOSABLE) ×2 IMPLANT
GOWN STRL REUS W/TWL LRG LVL3 (GOWN DISPOSABLE) ×6
KIT BASIN OR (CUSTOM PROCEDURE TRAY) ×3 IMPLANT
KIT TURNOVER KIT B (KITS) ×3 IMPLANT
NS IRRIG 1000ML POUR BTL (IV SOLUTION) ×3 IMPLANT
PACK GENERAL/GYN (CUSTOM PROCEDURE TRAY) IMPLANT
PACK LITHOTOMY IV (CUSTOM PROCEDURE TRAY) IMPLANT
PAD ABD 8X10 STRL (GAUZE/BANDAGES/DRESSINGS) ×6 IMPLANT
PAD ARMBOARD 7.5X6 YLW CONV (MISCELLANEOUS) ×3 IMPLANT
PENCIL SMOKE EVACUATOR (MISCELLANEOUS) ×3 IMPLANT
SPONGE LAP 18X18 RF (DISPOSABLE) IMPLANT
SURGILUBE 2OZ TUBE FLIPTOP (MISCELLANEOUS) ×3 IMPLANT
SYR BULB 3OZ (MISCELLANEOUS) ×3 IMPLANT
SYR BULB IRRIGATION 50ML (SYRINGE) ×2 IMPLANT
TAPE CLOTH SURG 6X10 WHT LF (GAUZE/BANDAGES/DRESSINGS) ×4 IMPLANT
TOWEL OR 17X24 6PK STRL BLUE (TOWEL DISPOSABLE) ×3 IMPLANT
TOWEL OR 17X26 10 PK STRL BLUE (TOWEL DISPOSABLE) ×3 IMPLANT
TUBE CONNECTING 12'X1/4 (SUCTIONS) ×1
TUBE CONNECTING 12X1/4 (SUCTIONS) ×2 IMPLANT
YANKAUER SUCT BULB TIP NO VENT (SUCTIONS) ×3 IMPLANT

## 2019-05-18 NOTE — Anesthesia Postprocedure Evaluation (Signed)
Anesthesia Post Note  Patient: [name]  Procedure(s) Performed: IRRIGATION AND DEBRIDEMENT BUTTOCKS (Left Buttocks)     Patient location during evaluation: PACU Anesthesia Type: General Level of consciousness: sedated and patient cooperative Pain management: pain level controlled Vital Signs Assessment: post-procedure vital signs reviewed and stable Respiratory status: spontaneous breathing Cardiovascular status: stable Anesthetic complications: no    Last Vitals:  Vitals:   05/18/19 0103 05/18/19 0555  BP: 116/61 (!) 103/57  Pulse: 96 (!) 101  Resp:  16  Temp: 37.2 C 37.9 C  SpO2:  97%    Last Pain:  Vitals:   05/18/19 0555  TempSrc: Oral  PainSc:                  Nolon Nations

## 2019-05-18 NOTE — Op Note (Signed)
Preop diagnosis: #1 abscess of the pannus of the left lower abdominal wall 2.  Left gluteal abscess status post debridement 05/17/2019.  Postop diagnosis: Same Procedure performed: Incision and drainage of left lower abdominal pannus abscess (skin and subcutaneous tissue 6 x 3 x 2 cm) Further debridement of left buttock abscess (skin and subcutaneous tissue 10 x 4 x 4 cm) Surgeon:Willie Loy K Lajean Boese Anesthesia: General Indications: This is a super morbidly obese 43 year old female with a BMI of 56 who presents after debridement of a large gluteal abscess on 05/17/2019.  She was found to have a small spontaneously draining abscess in the left lower part of her pannus.  She is brought back to the operating room today to debride this area as well as further irrigation, debridement, and dressing change of the buttock abscess under anesthesia.  Description of procedure: The patient is brought to the operating room placed in supine position on the operating room table.  After an adequate level of general anesthesia was obtained, the patient's lower abdomen was prepped after taping her pannus to her shoulders.  This exposed the crease under the pannus.  She has a small opening in this area that is draining purulent fluid.  We prepped the entire area with Betadine and draped in sterile fashion.  A timeout was taken to ensure the proper patient and proper procedure.  I inserted a hemostat and through the small opening.  The abscess seems to track both medial and laterally but not superior inferiorly.  Using cautery I excised an ellipse of skin and subcutaneous tissue measuring 6 x 3 cm.  We entered an abscess cavity.  We evacuated all the purulent fluid.  There is no further tracking in any direction.  We debrided the walls of the abscess cavity down to a depth of about 2 cm.  Hemostasis was obtained with cautery.  We irrigated the wound thoroughly and inspected for hemostasis.  The wound was packed with several saline  moistened 4 x 4 gauze pads.  ABD dressing was applied which was secured with tape.  We then turned the patient on her right side to expose the left gluteal abscess wound.  We prepped again with Betadine and draped sterile fashion.  Some of the adipose and inflamed muscular tissue appears necrotic.  We debrided this with cautery.  The additional debridement measured cumulatively 10 x 4 x 4 cm.  The entire wound measures about 15 x 9 x 4 cm.  At this point all the tissue appeared viable.  There is no further purulent drainage.  We irrigated the wound thoroughly and inspected for hemostasis.  I packed the wound with saline moistened Kerlix gauze.  ABD dressing was applied.  The patient was then extubated and brought to the recovery room in stable condition.  All sponge, instrument, and needle counts are correct.  [name]. Belinda Dover, MD, [place] [facility] Beeper [phone]  05/18/2019 3:18 PM

## 2019-05-18 NOTE — Anesthesia Postprocedure Evaluation (Signed)
Anesthesia Post Note  Patient: [name]  Procedure(s) Performed: IRRIGATION AND DEBRIDEMENT OF PANNIS ABSCESS, POSSIBLE DEBRIDEMENT OF BUTTOCK WOUND (Left Abdomen)     Patient location during evaluation: PACU Anesthesia Type: General Level of consciousness: awake and alert Pain management: pain level controlled Vital Signs Assessment: post-procedure vital signs reviewed and stable Respiratory status: spontaneous breathing, nonlabored ventilation, respiratory function stable and patient connected to nasal cannula oxygen Cardiovascular status: blood pressure returned to baseline and stable Postop Assessment: no apparent nausea or vomiting Anesthetic complications: no    Last Vitals:  Vitals:   05/18/19 1515 05/18/19 1530  BP: 113/64 (!) 95/53  Pulse: 95 91  Resp: 18 18  Temp: 36.8 C   SpO2: 100% 100%    Last Pain:  Vitals:   05/18/19 0833  TempSrc:   PainSc: 7                  Barnet Glasgow

## 2019-05-18 NOTE — Discharge Summary (Signed)
[facility] Discharge Summary  Patient name: [name] Medical record number: [id number] Date of birth: [birth year] Age: 43 y.o. Gender: female Date of Admission: 05/17/2019  Date of Discharge: 6/19 Admitting Physician: Martyn Malay, MD  Primary Care Provider: [name], MD Consultants: General Surgery, nephrology, GI  Indication for Hospitalization: Gluteal and pannus abscesses  Discharge Diagnoses/Problem List:  Left buttock abscess, abdominal pannus abscess AKI improved Hyperglycemia improved Hypertension Encephalopathy Anemia Morbid obesity Bilateral amputations Asthma Diabetic neuropathy Elevated alk phos  Disposition: Charge home  Discharge Condition: Stable  Discharge Exam:  General: Alert and cooperative and appears to be in no acute distress HEENT: Neck non-tender without lymphadenopathy, masses or thyromegaly Cardio: Normal S1 and S2, no S3 or S4. Rhythm is regular. No murmurs or rubs.   Pulm: Clear to auscultation bilaterally, no crackles, wheezing, or diminished breath sounds. Normal respiratory effort Abdomen: Bowel sounds normal. Abdomen soft and non-tender.  Extremities: No peripheral edema. Warm/ well perfused.  Strong radial pulse. Neuro: Cranial nerves grossly intact  Please see media tab for most recent images of wound healing.  Brief Hospital Course:  [name] a 43 y.o.femalepresenting with left buttock abscess and severely elevated blood glucose. PMH is significant forleft BKA, amputation all 5 digits right foot, type 2 diabetes with neuropathy, allergic rhinitis, morbid obesity, GERD, right carpal tunnel syndrome, left hand osteoarthritis, obesity hypoventilation syndrome.  Her hospital course is outlined below,  Left Buttocks Abscess Admission details can be found in H&P.  Patient with large left-sided buttock abscess on admission.  I&D performed by general surgery on 6/11 and patient started on  vancomycin and Zosyn.  During this operation, surgeon noted a lot of necrotic tissue as well as large abscess in the area.  Wound cultures showed abundant gram-positive cocci in abundant gram-positive rods and blood cultures showed no growth final read.  Given necrotic tissue, patient's antibiotics also broadened to include clindamycin.  On 6/12, patient also found to have new abscess wound abscess located under her pannus, draining purulent fluid.  Patient was taken back to the OR on 6/12 for I&D of this abdominal abscess as well as washout of her buttocks wound.  Her antibiotic regimen and duration can be found below.  ID was consulted to help determine appropriate antibiotic regimen duration.  She completed her antibiotic course on 6.  Following her second visit to the OR, her infection was well controlled and she did not require further debridement in the OR.  She continued to receive daily wound care and hydrotherapy.  On the day of discharge, surgery said that no follow-up with them as needed and recommended follow-up at a wound care clinic.  Additionally a home health RN for wound care was ordered. Surgery also recommended continued treatment with antifungals and Santyl for wound care.  Antibiotics: - s/p zosyn (6/11- 6/14) - s/p clindamycin (6/12- 6/14)  - s/p vanc (6/11 - 6/12) - cefazolin (6/14-6/18) - doxycycline (6/14-6/18)  Hyperglycemia Patient also hyperglycemic to greater than 550 on admission.  No evidence of DKA.  A1c 17.5 on admission.  Patient was started on insulin drip, and transition off after for surgery on 6/11.  Given new wound found on 6/12 and likelihood of patient returning to surgery, she was started on an insulin drip again until she could maintain reliable p.o. intake and given the importance of good glucose control in this patient with healing wounds.  She was ultimately controlled with Lantus 40 units daily in addition  to a sliding scale.  She was sent home on Lantus 40  units daily.  Her elevated A1c on admission suggests that she may not have been taking her home medication as prescribed.  She was sent home with Lantus 40 units daily because this would require only 1 injection daily.  We recommend keeping her on the simplest diabetic regimen possible in order to have the greatest chance of success at controlling her diabetes.  AKI While receiving vancomycin, she demonstrated a kidney injury with a creatinine elevated to 3.2.  Baseline creatinine 0.8.  After transitioning to different antibiotics, her kidney function improved at the time of discharge her creatinine was 1.1.  She has not yet returned to her baseline of 0.8.  Hypertension She was noted to have elevated blood pressures in the hospital.  Due to her kidney injury, decided to start her on amlodipine 10 mg daily.  At the time of discharge she was still demonstrating systolic pressures as high as 190.  She would likely benefit from starting an ACE/ARB in the outpatient setting.  We recommend starting 1 of these medications when her kidney function has returned to normal.  Placement Both physical therapy and Occupational Therapy recommended placement at a skilled nursing facility..  Social work and the primary medical team both spent time discussing the preference for skilled nursing facility.  She stated that she was not interested in going to skilled nursing facility because she would have to give up her disability check.  She was informed that returning home would increase her chance of wound infection and to further hospitalizations and morbidity.  She understood and maintained her request to go home.  She was discharged home with home health physical therapy, Occupational Therapy and a nurse for wound care.    Issues for Follow Up:  1. Ensure good follow-up with the wound clinic. 2. Monitor daily CBGs and modify her diabetic regimen as appropriate.  On admission, she had been using insulin 70/30, the time  of discharge this had been transitioned to Lantus 40 units daily. 3.   BMP to monitor creatinine.   4.   Consider starting ACE inhibitor/ARB for improved blood pressure control.  Significant Procedures:  6/11 -incision, drainage and debridement of gluteal abscess 6/12-incision, drainage and debridement of pannus abscess and further debridement of gluteal abscess  Significant Labs and Imaging:  Recent Labs  Lab 05/23/19 0418 05/24/19 0348 05/25/19 0431  WBC 12.6* 12.7* 13.0*  HGB 8.9* 9.3* 9.4*  HCT 28.6* 29.6* 30.1*  PLT 588* 632* 598*   Recent Labs  Lab 05/21/19 1604 05/22/19 1430 05/23/19 0418 05/24/19 0348 05/25/19 0431  NA 139 141 142 141 138  K 3.8 4.2 3.9 3.6 3.3*  CL 115* 112* 108 104 100  CO2 16* _0 GLUCOSE 143* 141* 124* 134* 210*  BUN 22* _1 CREATININE 1.95* 1.50* 1.18* 1.18* 1.09*  CALCIUM 6.9* 8.5* 8.9 8.8* 8.3*  PHOS 3.7  --   --   --   --   ALBUMIN 1.2*  --   --   --   --     Ct Pelvis W Contrast  Result Date: 05/17/2019 CLINICAL DATA:  Left buttock abscess EXAM: CT PELVIS WITH CONTRAST TECHNIQUE: Multidetector CT imaging of the pelvis was performed using the standard protocol following the bolus administration of intravenous contrast. CONTRAST:  160m ISOVUE-300 COMPARISON:  None. FINDINGS: Urinary Tract: Bladder is well distended. Visualized kidneys are unremarkable. Bowel: No  obstructive or inflammatory changes are seen. The appendix is within normal limits. Vascular/Lymphatic: No vascular abnormality is noted. Scattered small inguinal lymph nodes are noted likely reactive in nature given the patient's clinical history. No retroperitoneal adenopathy is seen. Reproductive:  No mass or other significant abnormality Other: Considerable inflammatory changes are noted in the left buttock with multifocal fluid attenuation areas. No large confluent abscess is identified. Associated soft tissue swelling is noted along the posterior aspect of the  left thigh. Musculoskeletal: No acute bony abnormality is noted. IMPRESSION: Inflammatory changes in the left buttock without focal confluent abscess formation. Mild reactive adenopathy in the inguinal region is noted left greater than right. Electronically Signed   By: Inez Catalina M.D.   On: 05/17/2019 12:32   US Renal  Result Date: 05/19/2019 CLINICAL DATA:  Acute kidney injury EXAM: RENAL / URINARY TRACT ULTRASOUND COMPLETE COMPARISON:  None. FINDINGS: Right Kidney: Renal measurements: 10.1 x 7.4 x 7.3 cm = volume: 282 mL. Examination was very limited secondary to patient body habitus. There is no definite  hydronephrosis. Left Kidney: Renal measurements: 12.6 x 6.9 x 7.2 cm = volume: 323 mL. Evaluation was significantly limited by patient body habitus. There is no  hydronephrosis. Bladder: Not visualized IMPRESSION: 1. Very limited study secondary to patient body habitus. The bladder was not visualized. 2. No  hydronephrosis. Electronically Signed   By: Constance Holster M.D.   On: 05/19/2019 22:34   Dg Chest Portable 1 View  Result Date: 05/13/2019 CLINICAL DATA:  43 year old female with history of worsening shortness of breath, cough, chills, fever and body aches. Chest pain. EXAM: PORTABLE CHEST 1 VIEW COMPARISON:  Chest x-ray 03/27/2019. FINDINGS: Chronic elevation of the right hemidiaphragm again noted. Lung volumes are normal. No consolidative airspace disease. No pleural effusions. No pneumothorax. No pulmonary nodule or mass noted. Pulmonary vasculature and the cardiomediastinal silhouette are within normal limits. IMPRESSION: 1.  No radiographic evidence of acute cardiopulmonary disease. Electronically Signed   By: Vinnie Langton M.D.   On: 05/13/2019 00:08   Dg Abd 2 Views  Result Date: 05/19/2019 CLINICAL DATA:  Abdominal pain, LEFT buttock abscess EXAM: ABDOMEN - 2 VIEW COMPARISON:  CT abdomen and pelvis 12/10/2016 FINDINGS: Scattered gas within nondistended large and  small bowel loops. No bowel wall thickening or free air. Osseous structures unremarkable. IMPRESSION: Nonobstructive bowel gas pattern. Electronically Signed   By: Lavonia Dana M.D.   On: 05/19/2019 14:23   [place] Ekg Site Rite  Result Date: 05/19/2019 If Site Rite image not attached, placement could not be confirmed due to current cardiac rhythm.    Results/Tests Pending at Time of Discharge: none  Discharge Medications:  Allergies as of 05/25/2019      Reactions   Kiwi Extract Shortness Of Breath, Swelling   Cefepime    AKI, see records from [place] hospitalization in January 2020   Trental [pentoxifylline] Nausea And Vomiting   Nubain [nalbuphine Hcl] Other (See Comments)   "FEELS LIKE SOMETHING CRAWLING ON ME"      Medication List    STOP taking these medications   nystatin powder Generic drug: nystatin Replaced by: nystatin cream     TAKE these medications   Accu-Chek Nano SmartView w/Device Kit Use to check blood sugar 3 times daily   Accu-Chek Softclix Lancets lancets Use as instructed to check blood sugar 3 times per day What changed: additional instructions   amLODipine 10 MG tablet Commonly known as: NORVASC Take 1 tablet (10 mg total) by  mouth daily.   atorvastatin 20 MG tablet Commonly known as: LIPITOR Take 1 tablet (20 mg total) by mouth daily.   budesonide-formoterol 160-4.5 MCG/ACT inhaler Commonly known as: Symbicort inhale 2 PUFFS into THE lungs 2 TIMES DAILY What changed:   how much to take  how to take this  when to take this  additional instructions   Cholecalciferol 25 MCG (1000 UT) tablet Take 1 tablet (1,000 Units total) by mouth daily.   collagenase ointment Commonly known as: SANTYL Apply topically daily.   cyclobenzaprine 5 MG tablet Commonly known as: FLEXERIL TAKE 1 TABLET BY MOUTH 3 TIMES DAILY AS NEEDED FOR MUSCLE SPASMS What changed: See the new instructions.   Ensure Max Protein Liqd Take 330 mLs (11 oz total) by mouth  daily.   nutrition supplement (JUVEN) Pack Take 1 packet by mouth 2 (two) times daily between meals.   gabapentin 600 MG tablet Commonly known as: NEURONTIN TAKE 2 TABLETS BY MOUTH 3 TIMES DAILY What changed: See the new instructions.   glucose blood test strip Commonly known as: Accu-Chek Guide USE T0 CHECK BLOOD SUGAR 3 TIMES DAILY   HYDROcodone-acetaminophen 10-325 MG tablet Commonly known as: NORCO Take 1 tablet by mouth every 8 (eight) hours as needed. What changed:   when to take this  Another medication with the same name was removed. Continue taking this medication, and follow the directions you see here.   insulin glargine 100 UNIT/ML injection Commonly known as: LANTUS Inject 0.4 mLs (40 Units total) into the skin daily.   Insulin Isophane & Regular Human (70-30) 100 UNIT/ML PEN Commonly known as: HUMULIN 70/30 MIX Inject 70 Units into the skin daily with breakfast AND 35 Units daily with supper.   lactobacillus acidophilus Tabs tablet Take 2 tablets by mouth 3 (three) times daily.   multivitamin with minerals Tabs tablet Take 1 tablet by mouth daily.   nystatin cream Commonly known as: MYCOSTATIN Apply topically 2 (two) times daily. Replaces: nystatin powder   Oxycodone HCl 10 MG Tabs Take 1 tablet (10 mg total) by mouth 2 (two) times daily as needed (Wound dressing Changes).   pantoprazole 40 MG tablet Commonly known as: PROTONIX Take 1 tablet (40 mg total) by mouth daily.   promethazine 12.5 MG tablet Commonly known as: PHENERGAN Take 1 tablet (12.5 mg total) by mouth every 6 (six) hours as needed for nausea or vomiting.   Proventil HFA 108 (90 Base) MCG/ACT inhaler Generic drug: albuterol Inhale 1-2 puffs into the lungs every 6 (six) hours as needed for wheezing or shortness of breath. What changed: See the new instructions.   psyllium 95 % Pack Commonly known as: HYDROCIL/METAMUCIL Take 1 packet by mouth daily.       Discharge  Instructions: Please refer to Patient Instructions section of EMR for full details.  Patient was counseled important signs and symptoms that should prompt return to medical care, changes in medications, dietary instructions, activity restrictions, and follow up appointments.   Follow-Up Appointments: Follow-up Information    [facility]             . Call on 05/30/2019.   Why: 1:15PM Contact information: [address] [place] [address] [place] Follow up.   Specialty: Home Health Services Why: home health services arranged Contact information: [address] [place] [address] [phone]  [name], MD 05/26/2019, 4:39 PM PGY-1, [place]

## 2019-05-18 NOTE — Transfer of Care (Signed)
Immediate Anesthesia Transfer of Care Note  Patient: [name]  Procedure(s) Performed: IRRIGATION AND DEBRIDEMENT OF PANNIS ABSCESS, POSSIBLE DEBRIDEMENT OF BUTTOCK WOUND (Left Abdomen)  Patient Location: PACU  Anesthesia Type:General  Level of Consciousness: awake and alert   Airway & Oxygen Therapy: Patient Spontanous Breathing and Patient connected to nasal cannula oxygen  Post-op Assessment: Report given to RN and Post -op Vital signs reviewed and stable  Post vital signs: Reviewed and stable  Last Vitals:  Vitals Value Taken Time  BP 113/64 05/18/19 1513  Temp    Pulse 92 05/18/19 1521  Resp 18 05/18/19 1521  SpO2 100 % 05/18/19 1521  Vitals shown include unvalidated device data.  Last Pain:  Vitals:   05/18/19 0833  TempSrc:   PainSc: 7       Patients Stated Pain Goal: 4 (33/61/22 4497)  Complications: No apparent anesthesia complications

## 2019-05-18 NOTE — Progress Notes (Signed)
1 Day Post-Op  Subjective: CC: Pain Patient reports pain in her left buttocks s/p I&D. She was found to have a new area of drainage under her panus this morning by family medicine. She reports this has been present for several weeks and started draining before the left buttocks abscess did. She has been treating this with warm compresses.   Patient was noted to be febrile overnight up to 102 (2242) on scheduled tylenol. with borderline tachycardia 95's-110's with an episodes of hypotension starting at 23:49. She was given a 1L bolus of fluids with a resolution of her hypotension. Maintenance fluids increased to 193ml/hr by primary team.   Objective: Vital signs in last 24 hours: Temp:  [98 F (36.7 C)-102 F (38.9 C)] 100.3 F (37.9 C) (06/12 0555) Pulse Rate:  [96-154] 101 (06/12 0555) Resp:  [15-20] 16 (06/12 0555) BP: (87-155)/(50-126) 103/57 (06/12 0555) SpO2:  [87 %-99 %] 97 % (06/12 0555)    Intake/Output from previous day: 06/11 0701 - 06/12 0700 In: 3981.2 [P.O.:360; I.V.:971.2; IV Piggyback:2600] Out: 1275 [Urine:1200; Blood:75] Intake/Output this shift: No intake/output data recorded.  PE: Gen: Awake and alert, lying in bed.  Lungs: Normal rate and effort Abd: Morbidly obese. Soft, ND, +BS. There is a noted area of induration under panus with a 87mm opening that is draining purulent fluid when palpated in the surrounding areas. Tender to touch.  GU: Chaperone present - Insurance underwriter. Please see picture below. Left buttock wound appears clean. There is surrounding induration and erythema that tracks up the left buttock. No fluctuance.  Msk: Left AKA     Lab Results:  Recent Labs    05/17/19 0640 05/17/19 1631  WBC 14.4* 16.2*  HGB 8.9* 9.3*  HCT 28.6* 29.1*  PLT 489* 482*   BMET Recent Labs    05/17/19 0640 05/17/19 1631  NA 127* 133*  K 4.5 4.6  CL 95* 102  CO2 22 21*  GLUCOSE 588* 263*  BUN 15 12  CREATININE 1.01* 1.19*  CALCIUM 8.6* 8.7*    PT/INR No results for input(s): LABPROT, INR in the last 72 hours. CMP     Component Value Date/Time   NA 133 (L) 05/17/2019 1631   NA 138 01/02/2019 1121   K 4.6 05/17/2019 1631   CL 102 05/17/2019 1631   CO2 21 (L) 05/17/2019 1631   GLUCOSE 263 (H) 05/17/2019 1631   BUN 12 05/17/2019 1631   BUN 22 01/02/2019 1121   CREATININE 1.19 (H) 05/17/2019 1631   CREATININE 0.51 08/05/2015 1114   CALCIUM 8.7 (L) 05/17/2019 1631   PROT 6.7 05/17/2019 0640   ALBUMIN 1.7 (L) 05/17/2019 0640   AST 10 (L) 05/17/2019 0640   ALT 14 05/17/2019 0640   ALKPHOS 291 (H) 05/17/2019 0640   BILITOT 0.2 (L) 05/17/2019 0640   GFRNONAA 57 (L) 05/17/2019 1631   GFRAA >60 05/17/2019 1631   Lipase     Component Value Date/Time   LIPASE 24 05/08/2019 1927       Studies/Results: Ct Pelvis W Contrast  Result Date: 05/17/2019 CLINICAL DATA:  Left buttock abscess EXAM: CT PELVIS WITH CONTRAST TECHNIQUE: Multidetector CT imaging of the pelvis was performed using the standard protocol following the bolus administration of intravenous contrast. CONTRAST:  135mL ISOVUE-300 COMPARISON:  None. FINDINGS: Urinary Tract: Bladder is well distended. Visualized kidneys are unremarkable. Bowel: No obstructive or inflammatory changes are seen. The appendix is within normal limits. Vascular/Lymphatic: No vascular abnormality is noted. Scattered small inguinal  lymph nodes are noted likely reactive in nature given the patient's clinical history. No retroperitoneal adenopathy is seen. Reproductive:  No mass or other significant abnormality Other: Considerable inflammatory changes are noted in the left buttock with multifocal fluid attenuation areas. No large confluent abscess is identified. Associated soft tissue swelling is noted along the posterior aspect of the left thigh. Musculoskeletal: No acute bony abnormality is noted. IMPRESSION: Inflammatory changes in the left buttock without focal confluent abscess formation. Mild  reactive adenopathy in the inguinal region is noted left greater than right. Electronically Signed   By: Inez Catalina M.D.   On: 05/17/2019 12:32    Anti-infectives: Anti-infectives (From admission, onward)   Start     Dose/Rate Route Frequency Ordered Stop   05/18/19 1100  clindamycin (CLEOCIN) IVPB 600 mg     600 mg 100 mL/hr over 30 Minutes Intravenous Every 8 hours 05/18/19 1022     05/18/19 0900  vancomycin (VANCOCIN) 1,750 mg in sodium chloride 0.9 % 500 mL IVPB     1,750 mg 250 mL/hr over 120 Minutes Intravenous Every 24 hours 05/17/19 0759     05/17/19 1630  piperacillin-tazobactam (ZOSYN) IVPB 3.375 g     3.375 g 12.5 mL/hr over 240 Minutes Intravenous Every 8 hours 05/17/19 1609     05/17/19 0730  vancomycin (VANCOCIN) 2,000 mg in sodium chloride 0.9 % 500 mL IVPB     2,000 mg 250 mL/hr over 120 Minutes Intravenous  Once 05/17/19 0729 05/17/19 1130       Assessment/Plan Type II DM Hyperglycemia  OSA HTN HLD GERD COPD Chronic pain L AKA Bipolar  - above per medicine  Pannus abscess L buttock abscess - s/p incision and sharp debridement using cautery and scalpel of left buttock abscess, Dr. Georgette Dover - 6/11 - POD #1 - Plan to take back to the OR for I&D of pannus abscess and possible further debridement of left buttock abscess - Continue IV abx - Gram stain w/ GPC and GPR. Culture pending   FEN - NPO VTE - None currently. Reassess after OR ID - Vanc/Zosyn 6/11 >> Clinda 6/12>>    LOS: 1 day    Jillyn Ledger , [facility] Surgery 05/18/2019, 11:33 AM Pager: [phone]

## 2019-05-18 NOTE — Anesthesia Preprocedure Evaluation (Addendum)
Anesthesia Evaluation  Patient identified by MRN, date of birth, ID band Patient awake    Reviewed: Allergy & Precautions, NPO status , Patient's Chart, lab work & pertinent test results, reviewed documented beta blocker date and time   Airway Mallampati: III  TM Distance: >3 FB Neck ROM: Full    Dental  (+) Dental Advisory Given, Teeth Intact   Pulmonary asthma , sleep apnea and Continuous Positive Airway Pressure Ventilation , pneumonia, COPD,  COPD inhaler, former smoker,    Pulmonary exam normal breath sounds clear to auscultation       Cardiovascular hypertension, Pt. on medications + Peripheral Vascular Disease  Normal cardiovascular exam Rhythm:Regular Rate:Normal  '18 TTE -  Ejection fraction was in the range of 55%   to 60%. Left atrium was mildly dilated.   Neuro/Psych  Headaches, PSYCHIATRIC DISORDERS Depression Bipolar Disorder Diabetic neuropathy  Neuromuscular disease    GI/Hepatic negative GI ROS, Neg liver ROS, Medicated and Controlled,  Endo/Other  diabetes, Poorly Controlled, Type 2, Insulin DependentMorbid obesity  Renal/GU Renal Insufficiency  negative genitourinary   Musculoskeletal  (+) Arthritis , Osteoarthritis,  Osteomyelitis left foot   Abdominal   Peds  Hematology  (+) Blood dyscrasia, anemia ,   Anesthesia Other Findings Day of surgery medications reviewed with the patient.  Reproductive/Obstetrics                                                              Anesthesia Evaluation  Patient identified by MRN, date of birth, ID band Patient awake    Reviewed: Allergy & Precautions, H&P , NPO status , Patient's Chart, lab work & pertinent test results, reviewed documented beta blocker date and time   History of Anesthesia Complications Negative for: history of anesthetic complications  Airway Mallampati: III  Neck ROM: Limited    Dental  (+) Teeth Intact  and Dental Advisory Given   Pulmonary asthma , sleep apnea and Continuous Positive Airway Pressure Ventilation , COPD COPD inhaler,    + decreased breath sounds      Cardiovascular hypertension, Pt. on medications and Pt. on home beta blockers + angina Rhythm:Regular Rate:Normal  Cardiac cath 2013 - "clear per patient"   Neuro/Psych  Neuromuscular disease    GI/Hepatic Neg liver ROS, GERD-  Medicated and Controlled,  Endo/Other  diabetes, Type 2, Oral Hypoglycemic Agents and Insulin DependentMorbid obesity  Renal/GU negative Renal ROS     Musculoskeletal negative musculoskeletal ROS (+)   Abdominal (+) + obese,   Peds  Hematology negative hematology ROS (+)   Anesthesia Other Findings   Reproductive/Obstetrics negative OB ROS                          Anesthesia Physical Anesthesia Plan  ASA: IV  Anesthesia Plan: General   Post-op Pain Management:    Induction: Intravenous  Airway Management Planned: Oral ETT and Video Laryngoscope Planned  Additional Equipment:   Intra-op Plan:   Post-operative Plan: Extubation in OR and Possible Post-op intubation/ventilation  Informed Consent: I have reviewed the patients History and Physical, chart, labs and discussed the procedure including the risks, benefits and alternatives for the proposed anesthesia with the patient or authorized representative who has indicated his/her understanding and acceptance.   Dental  advisory given  Plan Discussed with: CRNA and Surgeon  Anesthesia Plan Comments:         Anesthesia Quick Evaluation  Anesthesia Physical  Anesthesia Plan  ASA: IV and emergent  Anesthesia Plan: General   Post-op Pain Management:    Induction: Intravenous and Rapid sequence  PONV Risk Score and Plan: 4 or greater and Ondansetron, Treatment may vary due to age or medical condition, Dexamethasone and Midazolam  Airway Management Planned: Oral ETT and Video  Laryngoscope Planned  Additional Equipment: None  Intra-op Plan:   Post-operative Plan: Extubation in OR  Informed Consent: I have reviewed the patients History and Physical, chart, labs and discussed the procedure including the risks, benefits and alternatives for the proposed anesthesia with the patient or authorized representative who has indicated his/her understanding and acceptance.     Dental advisory given  Plan Discussed with: CRNA  Anesthesia Plan Comments:        Anesthesia Quick Evaluation

## 2019-05-18 NOTE — Anesthesia Procedure Notes (Signed)
Procedure Name: Intubation Date/Time: 05/18/2019 1:59 PM Performed by: Inda Coke, CRNA Pre-anesthesia Checklist: Patient identified, Emergency Drugs available, Suction available and Patient being monitored Patient Re-evaluated:Patient Re-evaluated prior to induction Oxygen Delivery Method: Circle System Utilized Preoxygenation: Pre-oxygenation with 100% oxygen Induction Type: IV induction, Rapid sequence and Cricoid Pressure applied Laryngoscope Size: Glidescope and 4 Grade View: Grade I Tube type: Oral Tube size: 7.5 mm Number of attempts: 1 Airway Equipment and Method: Stylet and Oral airway Placement Confirmation: ETT inserted through vocal cords under direct vision,  positive ETCO2 and breath sounds checked- equal and bilateral Secured at: 22 cm Tube secured with: Tape Dental Injury: Teeth and Oropharynx as per pre-operative assessment

## 2019-05-18 NOTE — Progress Notes (Signed)
Wound Care Note Wound type: Circular wound in panus (possible abscess) Measurement: Approx 1cm x 1cm. Unable to measure depth d/t patient extreme pain level and pushing my hand back. Wound LDK:CCQFJU with malodorous purulent drainage. Wound assessed by Dr. Owens Shark and [facility], will possibly do an I & D of wound today.  Rosalio Loud, MSN, RN, [facility]

## 2019-05-18 NOTE — Progress Notes (Signed)
Patient is fully awake but tearful.  She knew that she was in the hospital but could not tell why.  She asked for her mother and daughter.  RN called the mother and she spoke with her and her daughter after which she felt better.  She is in bed resting.  Will continue to monitor.

## 2019-05-18 NOTE — Anesthesia Postprocedure Evaluation (Signed)
Anesthesia Post Note  Patient: [name]  Procedure(s) Performed: IRRIGATION AND DEBRIDEMENT OF PANNIS ABSCESS, POSSIBLE DEBRIDEMENT OF BUTTOCK WOUND (Left Abdomen)     Anesthesia Type: General    Last Vitals:  Vitals:   05/18/19 1515 05/18/19 1530  BP: 113/64 (!) 95/53  Pulse: 95 91  Resp: 18 18  Temp: 36.8 C   SpO2: 100% 100%    Last Pain:  Vitals:   05/18/19 0833  TempSrc:   PainSc: 7                  Barnet Glasgow

## 2019-05-18 NOTE — Progress Notes (Signed)
Pharmacy Antibiotic Note  [name] is a 43 y.o. female on day # 2 Vancomycin and Zosyn for L buttocks abscess. S/p I&D on 6/11. Temp to 102 today, purulent material noted expressed from midline abscess, for possible I&D today. Pharmacy has been consulted for Clindamycin dosing.  Plan:  Clindamycin 600 mg IV q8hrs.  Continue Vancomycin 1750 mg IV q24hrs.  Continue Zosyn 3.375 gm IV q8hrs.  Follow up renal function, culture data, clinical progress and antibiotic plans.  Vancomycin goal AUC 400-550.  Expected AUC: 476  SCr used: 1.01   Height: 5\' 2"  (157.5 cm) Weight: (!) 308 lb 10.3 oz (140 kg) IBW/kg (Calculated) : 50.1  Temp (24hrs), Avg:99.7 F (37.6 C), Min:98 F (36.7 C), Max:102 F (38.9 C)  Recent Labs  Lab 05/17/19 0640 05/17/19 1631  WBC 14.4* 16.2*  CREATININE 1.01* 1.19*    Estimated Creatinine Clearance: 84.6 mL/min (A) (by C-G formula based on SCr of 1.19 mg/dL (H)).    Allergies  Allergen Reactions  . Kiwi Extract Shortness Of Breath and Swelling  . Cefepime     AKI, see records from [place] hospitalization in January 2020  . Trental [Pentoxifylline] Nausea And Vomiting  . Nubain [Nalbuphine Hcl] Other (See Comments)    "FEELS LIKE SOMETHING CRAWLING ON ME"    Antimicrobials this admission:  Vancomycin  6/11>>  Zosyn 6/11>>  Clindamycin 6/12>>  Dose adjustments this admission:  n/a  Microbiology results:  6/11 blood x 2 - no growth < 24 hrs to date  6/11 buttocks (deep/surgical wound) - abundant GPC and GPR, pending  6/11 COVID: negative  Thank you for allowing pharmacy to be a part of this patient's care.  Arty Baumgartner, [place] Pager: [phone] or phoneL [phone] 05/18/2019 10:24 AM

## 2019-05-18 NOTE — Progress Notes (Signed)
DKA Management - Sidebar for Nursing  Reminder: Only one phase of DKA order set should be ordered at a time   1.  Administer IVF bolus, if ordered  2.  Once IV bolus complete, order stat BMET and call results to MD  3.  Start Maintenance IV fluids as ordered              a.  If Blood Glucose >/= 250 mg/dL, Start NS at ordered rate             b.  If Blood Glucose <250 mg/dL, Start or switch to D5-1/2 NS per ordered rate  4.  Monitor BG's (Blood Glucose) hourly  5.  Start IV insulin per [employer]              a.  High target= 180 mg/dL, Low target=140 mg/dL, Multiplier=0.01             b.  Do not use Lab glucose             c.  If BG reads critical high, enter 601-Do not enter 601 more than 3 hours in a row.  If BG remains HIGH, restart GlucoStabilizer with original multiplier of 0.01             d.  If insulin infusion rate greater than 30 units/hr, stop insulin infusion for 30 minutes and restart GlucoStabilizer with original multiplier of 0.01             e.  If BG < 70 mg/dL:                         i.  Follow GlucoStabilizer instructions                         ii.  Change multiplier to 0.01             f.  Notify MD if:                         i.  Insulin infusion rate less than 1.0 units/ hour                         ii.  CBG less than 70 mg/dL after treating hypoglycemia twice  6.  BMET every 4 hours until acidosis cleared-ALL BMETS SHOULD BE CALLED TO MD  7.  Electrolytes             a.  Follow initial orders for K replacement             b.  If K<3.3, consider holding IV insulin until K replaced and greater than 3.3-RN will need to discuss with ordering MD.             c.  Subsequent K orders, will need to be ordered by MD when q 4 hour BMET's called  8.  IV insulin infusion with sufficient glucose should be continued until acidosis is corrected as determined by MD Venous CO2=20, normal anion gap (8-12), negative ketones.  9.  While on IV  insulin, if patient is eating cover carbohydrate (CHO) intake using IV insulin/CHO coverage via Glucostabilizer (1 unit for every 10 grams of CHO)  10.  Transition to SQ insulin-MD will initiate Phase 2 of DKA Protocol for transition off insulin drip.  11.  RN discontinue insulin drip 2 hours  after subcutaneous basal insulin given and simultaneously give Novolog correction scale.  12.  DKA Flowchart attached - DKA Flowchart   Anion Gap Formula: Na- (Cl + CO2)                              Normal range= 8-12 meq/L

## 2019-05-18 NOTE — Discharge Instructions (Signed)
WOUND CARE: - dressing to be changed twice daily - supplies: sterile saline, kerlix, scissors, ABD pads, tape  - remove dressing and all packing carefully, moistening with sterile saline as needed to avoid packing/internal dressing sticking to the wound. - clean edges of skin around the wound with water/gauze, making sure there is no tape debris or leakage left on skin that could cause skin irritation or breakdown. - dampen and clean kerlix with sterile saline and pack wound from wound base to skin level, making sure to take note of any possible areas of wound tracking, tunneling and packing appropriately. Wound can be packed loosely. Trim kerlix to size if a whole kerlix is not required. - cover wound with a dry ABD pad and secure with tape.  - write the date/time on the dry dressing/tape to better track when the last dressing change occurred. - apply any skin protectant/powder recommended by clinician to protect skin/skin folds. - change dressing as needed if leakage occurs, wound gets contaminated, or patient requests to shower. - patient may shower daily with wound open and following the shower the wound should be dried and a clean dressing placed.     1. PAIN CONTROL:  1. It is helpful to take an over-the-counter pain medication regularly for the first few weeks. Choose one of the following that works best for you:  i. Naproxen (Aleve, etc) Two 220mg  tabs twice a day ii. Ibuprofen (Advil, etc) Three 200mg  tabs four times a day (every meal & bedtime) iii. Acetaminophen (Tylenol, etc) 500-650mg  four times a day (every meal & bedtime) 2. A prescription for pain medication (such as oxycodone, hydrocodone, etc) may be given to you upon discharge. Take your pain medication as prescribed.  i. If you are having problems/concerns with the prescription medicine (does not control pain, nausea, vomiting, rash, itching, etc), please call us [phone] to see if we need to switch you to a different  pain medicine that will work better for you and/or control your side effect better. ii. If you need a refill on your pain medication, please contact your pharmacy. They will contact our office to request authorization. Prescriptions will not be filled after 5 pm or on week-ends. 1. Avoid getting constipated. When taking pain medications, it is common to experience some constipation. Increasing fluid intake and taking a fiber supplement (such as Metamucil, Citrucel, FiberCon, MiraLax, etc) 1-2 times a day regularly will usually help prevent this problem from occurring. A mild laxative (prune juice, Milk of Magnesia, MiraLax, etc) should be taken according to package directions if there are no bowel movements after 48 hours.  2. Watch out for diarrhea. If you have many loose bowel movements, simplify your diet to bland foods & liquids for a few days. Stop any stool softeners and decrease your fiber supplement. Switching to mild anti-diarrheal medications (Kayopectate, Pepto Bismol) can help. If this worsens or does not improve, please call us. 3. Shower daily but do not bathe until your wounds heal. Cover your wounds with clean gauze and tape after showering.  4. FOLLOW UP  a. If a follow up appointment is needed one will be scheduled for you. Please call [facility] at [phone] if you have any questions about follow up.   WHEN TO CALL us [phone]:  1. Poor pain control 2. Reactions / problems with new medications (rash/itching, nausea, etc)  3. Fever over 101.5 F (38.5 C) 4. Worsening swelling or bruising 5. Redness, swelling, foul discharge or increased pain  from wounds 6. Productive cough, difficulty breathing or any other concerning symptoms  The clinic staff is available to answer your questions during regular business hours (8:30am-5pm). Please dont hesitate to call and ask to speak to one of our nurses for clinical concerns.  If you have a medical emergency, go to the nearest emergency  room or call 911.  A surgeon from [facility] is always on call at the [facility], [address], [place], [place], [place] [address] ?  MAIN: [phone] ? TOLL FREE: [phone] ?  FAX [phone]) [phone]  [email or web address]

## 2019-05-18 NOTE — Progress Notes (Signed)
Patient was assessed at bedside around 2 AM.  She showed significant improvement in her mental status compared to earlier check (around 8:30 PM).  She is able to to interact appropriately and respond appropriately to questions.  She denied shortness of breath or arm pain.  Continue current medical management.  [name], MD

## 2019-05-18 NOTE — Progress Notes (Signed)
PT Cancellation Note  Patient Details Name: [name] MRN: [id number] DOB: Apr 14, 1976   Cancelled Treatment:       Reason Eval/Treat Not Completed: Patient at procedure or test/ unavailable pt in surgery. PT will return later if time allows and pt is appropriate.    Carney Living 05/18/2019, 3:58 PM

## 2019-05-18 NOTE — Plan of Care (Signed)

## 2019-05-18 NOTE — Progress Notes (Signed)
OT Cancellation Note  Patient Details Name: [name] MRN: [id number] DOB: January 05, 1976   Cancelled Treatment:    Reason Eval/Treat Not Completed: Patient at procedure or test/ unavailable pt in surgery. OT will return later if time allows and pt is appropriate.   Dorinda Hill OTR/L Acute Rehabilitation Services Office: [phone]   Wyn Forster 05/18/2019, 12:54 PM

## 2019-05-19 ENCOUNTER — Inpatient Hospital Stay (HOSPITAL_COMMUNITY): Payer: Medicaid Other

## 2019-05-19 ENCOUNTER — Encounter (HOSPITAL_COMMUNITY): Payer: Self-pay | Admitting: Surgery

## 2019-05-19 ENCOUNTER — Inpatient Hospital Stay: Payer: Self-pay

## 2019-05-19 DIAGNOSIS — R5081 Fever presenting with conditions classified elsewhere: Secondary | ICD-10-CM

## 2019-05-19 LAB — URINALYSIS, ROUTINE W REFLEX MICROSCOPIC
Bilirubin Urine: NEGATIVE
Glucose, UA: NEGATIVE mg/dL
Ketones, ur: NEGATIVE mg/dL
Nitrite: NEGATIVE
Protein, ur: 100 mg/dL — AB
Specific Gravity, Urine: 1.016 (ref 1.005–1.030)
WBC, UA: >50 WBC/hpf — ABNORMAL HIGH (ref 0–5)
pH: 5 (ref 5.0–8.0)

## 2019-05-19 LAB — CBC
HCT: 24.3 % — ABNORMAL LOW (ref 36.0–46.0)
HCT: 23.6 % — ABNORMAL LOW (ref 36.0–46.0)
Hemoglobin: 7.4 g/dL — ABNORMAL LOW (ref 12.0–15.0)
Hemoglobin: 7.3 g/dL — ABNORMAL LOW (ref 12.0–15.0)
MCH: 28.6 pg (ref 26.0–34.0)
MCH: 29 pg (ref 26.0–34.0)
MCHC: 30.5 g/dL (ref 30.0–36.0)
MCHC: 30.9 g/dL (ref 30.0–36.0)
MCV: 93.8 fL (ref 80.0–100.0)
MCV: 93.7 fL (ref 80.0–100.0)
Platelets: 397 10*3/uL (ref 150–400)
Platelets: 384 10*3/uL (ref 150–400)
RBC: 2.59 MIL/uL — ABNORMAL LOW (ref 3.87–5.11)
RBC: 2.52 MIL/uL — ABNORMAL LOW (ref 3.87–5.11)
RDW: 15 % (ref 11.5–15.5)
RDW: 15 % (ref 11.5–15.5)
WBC: 16.2 10*3/uL — ABNORMAL HIGH (ref 4.0–10.5)
WBC: 15.2 10*3/uL — ABNORMAL HIGH (ref 4.0–10.5)
nRBC: 0 % (ref 0.0–0.2)
nRBC: 0 % (ref 0.0–0.2)

## 2019-05-19 LAB — BASIC METABOLIC PANEL
Anion gap: 8 (ref 5–15)
Anion gap: 10 (ref 5–15)
Anion gap: 10 (ref 5–15)
BUN: 20 mg/dL (ref 6–20)
BUN: 26 mg/dL — ABNORMAL HIGH (ref 6–20)
BUN: 29 mg/dL — ABNORMAL HIGH (ref 6–20)
CO2: 17 mmol/L — ABNORMAL LOW (ref 22–32)
CO2: 18 mmol/L — ABNORMAL LOW (ref 22–32)
CO2: 17 mmol/L — ABNORMAL LOW (ref 22–32)
Calcium: 7 mg/dL — ABNORMAL LOW (ref 8.9–10.3)
Calcium: 7.6 mg/dL — ABNORMAL LOW (ref 8.9–10.3)
Calcium: 7.8 mg/dL — ABNORMAL LOW (ref 8.9–10.3)
Chloride: 108 mmol/L (ref 98–111)
Chloride: 102 mmol/L (ref 98–111)
Chloride: 103 mmol/L (ref 98–111)
Creatinine, Ser: 2.72 mg/dL — ABNORMAL HIGH (ref 0.44–1.00)
Creatinine, Ser: 3.59 mg/dL — ABNORMAL HIGH (ref 0.44–1.00)
Creatinine, Ser: 3.57 mg/dL — ABNORMAL HIGH (ref 0.44–1.00)
GFR calc Af Amer: 24 mL/min — ABNORMAL LOW (ref >60)
GFR calc Af Amer: 17 mL/min — ABNORMAL LOW (ref >60)
GFR calc Af Amer: 17 mL/min — ABNORMAL LOW (ref >60)
GFR calc non Af Amer: 21 mL/min — ABNORMAL LOW (ref >60)
GFR calc non Af Amer: 15 mL/min — ABNORMAL LOW (ref >60)
GFR calc non Af Amer: 15 mL/min — ABNORMAL LOW (ref >60)
Glucose, Bld: 195 mg/dL — ABNORMAL HIGH (ref 70–99)
Glucose, Bld: 278 mg/dL — ABNORMAL HIGH (ref 70–99)
Glucose, Bld: 201 mg/dL — ABNORMAL HIGH (ref 70–99)
Potassium: 4.5 mmol/L (ref 3.5–5.1)
Potassium: 5.4 mmol/L — ABNORMAL HIGH (ref 3.5–5.1)
Potassium: 5.2 mmol/L — ABNORMAL HIGH (ref 3.5–5.1)
Sodium: 133 mmol/L — ABNORMAL LOW (ref 135–145)
Sodium: 130 mmol/L — ABNORMAL LOW (ref 135–145)
Sodium: 130 mmol/L — ABNORMAL LOW (ref 135–145)

## 2019-05-19 LAB — GLUCOSE, CAPILLARY
Glucose-Capillary: 134 mg/dL — ABNORMAL HIGH (ref 70–99)
Glucose-Capillary: 162 mg/dL — ABNORMAL HIGH (ref 70–99)
Glucose-Capillary: 207 mg/dL — ABNORMAL HIGH (ref 70–99)
Glucose-Capillary: 247 mg/dL — ABNORMAL HIGH (ref 70–99)
Glucose-Capillary: 222 mg/dL — ABNORMAL HIGH (ref 70–99)
Glucose-Capillary: 204 mg/dL — ABNORMAL HIGH (ref 70–99)
Glucose-Capillary: 139 mg/dL — ABNORMAL HIGH (ref 70–99)

## 2019-05-19 LAB — SODIUM, URINE, RANDOM: Sodium, Ur: 14 mmol/L

## 2019-05-19 LAB — CREATININE, URINE, RANDOM: Creatinine, Urine: 101.98 mg/dL

## 2019-05-19 LAB — ABO/RH: ABO/RH(D): O POS

## 2019-05-19 MED ORDER — PIPERACILLIN-TAZOBACTAM 3.375 G IVPB
3.3750 g | Freq: Three times a day (TID) | INTRAVENOUS | Status: DC
  Administered 2019-05-19 – 2019-05-20 (×3): 3.375 g via INTRAVENOUS
  Filled 2019-05-19 (×4): qty 50

## 2019-05-19 MED ORDER — ONDANSETRON HCL 4 MG PO TABS: 4.0000 mg | ORAL_TABLET | Freq: Once | ORAL | Status: DC

## 2019-05-19 MED ORDER — ONDANSETRON HCL 4 MG/2ML IJ SOLN
4.0000 mg | Freq: Once | INTRAMUSCULAR | Status: AC
  Administered 2019-05-19: 4 mg via INTRAVENOUS
  Filled 2019-05-19: qty 2

## 2019-05-19 MED ORDER — INSULIN GLARGINE 100 UNIT/ML ~~LOC~~ SOLN
40.0000 [IU] | Freq: Every day | SUBCUTANEOUS | Status: DC
  Administered 2019-05-19 – 2019-05-25 (×7): 40 [IU] via SUBCUTANEOUS
  Filled 2019-05-19 (×7): qty 0.4

## 2019-05-19 MED ORDER — HYDROMORPHONE HCL 1 MG/ML IJ SOLN
1.0000 mg | INTRAMUSCULAR | Status: DC | PRN
  Administered 2019-05-19 – 2019-05-20 (×5): 1 mg via INTRAVENOUS
  Filled 2019-05-19 (×5): qty 1

## 2019-05-19 MED ORDER — ENOXAPARIN SODIUM 40 MG/0.4ML ~~LOC~~ SOLN
40.0000 mg | Freq: Two times a day (BID) | SUBCUTANEOUS | Status: DC
  Administered 2019-05-19: 40 mg via SUBCUTANEOUS
  Filled 2019-05-19: qty 0.4

## 2019-05-19 MED ORDER — INSULIN ASPART 100 UNIT/ML ~~LOC~~ SOLN
0.0000 [IU] | Freq: Three times a day (TID) | SUBCUTANEOUS | Status: DC
  Administered 2019-05-19 (×2): 5 [IU] via SUBCUTANEOUS
  Administered 2019-05-20: 3 [IU] via SUBCUTANEOUS

## 2019-05-19 MED ORDER — SODIUM CHLORIDE 0.9% FLUSH: 10.0000 mL | INTRAVENOUS | Status: DC | PRN

## 2019-05-19 MED ORDER — PROMETHAZINE HCL 25 MG PO TABS
12.5000 mg | ORAL_TABLET | Freq: Four times a day (QID) | ORAL | Status: DC | PRN
  Administered 2019-05-20 – 2019-05-24 (×6): 12.5 mg via ORAL
  Filled 2019-05-19 (×6): qty 1

## 2019-05-19 MED ORDER — ONDANSETRON HCL 4 MG PO TABS
8.0000 mg | ORAL_TABLET | Freq: Four times a day (QID) | ORAL | Status: DC | PRN
  Filled 2019-05-19: qty 2

## 2019-05-19 MED ORDER — ONDANSETRON HCL 4 MG/2ML IJ SOLN
4.0000 mg | Freq: Once | INTRAMUSCULAR | Status: DC
  Filled 2019-05-19: qty 2

## 2019-05-19 MED ORDER — PIPERACILLIN-TAZOBACTAM IN DEX 2-0.25 GM/50ML IV SOLN
2.2500 g | Freq: Four times a day (QID) | INTRAVENOUS | Status: DC
  Administered 2019-05-19 (×3): 2.25 g via INTRAVENOUS
  Filled 2019-05-19 (×4): qty 50

## 2019-05-19 MED ORDER — CHLORHEXIDINE GLUCONATE CLOTH 2 % EX PADS
6.0000 | MEDICATED_PAD | Freq: Every day | CUTANEOUS | Status: DC
  Administered 2019-05-20 – 2019-05-25 (×6): 6 via TOPICAL

## 2019-05-19 NOTE — Progress Notes (Signed)
Pharmacy Antibiotic Note  [name] is a 43 y.o. female on day #3 Vancomycin and Zosyn for L buttocks abscess. S/p I&D on 6/11. Temp to 102 today, purulent material noted expressed from midline abscess, for possible I&D today. Pharmacy has been consulted for Clindamycin dosing.  6/13 Update: PICC Placed. Creatinine continues to trend up 2.72>>3.59  Plan: Clindamycin 600 mg IV q8hrs. -Consider discontinuation after PM dose (48hr duration) Hold vancomycin for now -Possibly Vanc random tomorrow -Expected AUC: 476 Change back to Zosyn 3.375 gm IV q8hrs. Follow up renal function, culture data, clinical progress and antibiotic plans.   Height: 5\' 2"  (157.5 cm) Weight: (!) 322 lb 8.5 oz (146.3 kg) IBW/kg (Calculated) : 50.1  Temp (24hrs), Avg:98.8 F (37.1 C), Min:98.3 F (36.8 C), Max:99.5 F (37.5 C)  Recent Labs  Lab 05/17/19 0640 05/17/19 1631 05/18/19 1139 05/18/19 1930 05/19/19 1151  WBC 14.4* 16.2* 17.7* 15.4*  --   CREATININE 1.01* 1.19*  --  2.72* 3.59*    Estimated Creatinine Clearance: 28.8 mL/min (A) (by C-G formula based on SCr of 3.59 mg/dL (H)).    Allergies  Allergen Reactions  . Kiwi Extract Shortness Of Breath and Swelling  . Cefepime     AKI, see records from [place] hospitalization in January 2020  . Trental [Pentoxifylline] Nausea And Vomiting  . Nubain [Nalbuphine Hcl] Other (See Comments)    "FEELS LIKE SOMETHING CRAWLING ON ME"    Antimicrobials this admission:  Vancomycin  6/11>>  Zosyn 6/11>>  Clindamycin 6/12>>  Dose adjustments this admission:  n/a  Microbiology results:  6/11 blood x 2 - no growth < 24 hrs to date  6/11 buttocks (deep/surgical wound) - abundant GPC and GPR, pending  6/11 COVID: negative  Thank you for allowing pharmacy to be a part of this patient's care.  Janae Bridgeman, PharmD PGY1 Pharmacy Resident Phone: [phone] 05/19/2019 12:56 PM

## 2019-05-19 NOTE — Progress Notes (Signed)
IV infiltrated.  Pt now has one functional IV through which she is receiving: Vanc, Zosyn, Clinda, Insulin, 1 unit pRBC.  IV team made 2 attempts to place new access without success.  PICC not possible in the middle of the night per IV team.  Central line is possible.    Plan to proceed with one IV line for now.

## 2019-05-19 NOTE — Progress Notes (Signed)
I am requested that him to go to bedside to assess patient.  Patient was assessed at bedside and noted continued discomfort in her bottom and abdomen.  She reports feeling worsened from earlier in the night.  She also feeling nauseated and had one episode of NB emesis.  Physical exam: General: Patient laying in bed, and comfortably positioned trying to sleep with the head of the bed tilted upward. Cardiac: Mildly tachycardic up to 105.  Regular rhythm.  Strong radial pulse. Respiratory: Breathing comfortably on room air, no CPAP on.  Assessment/Plan [name] continues to feel unwell likely secondary to her ongoing abscesses with poor source control.  Vitals remain stable from this afternoon with respirations under 20 and heart rate in the 90s and 100s, stable BP.  For now, we will continue with IV antibiotics-Vanco, Zosyn, Clinda-and Zofran PRN for nausea.  She will likely need PICC line in the morning for improved vascular access for her many IV medications.  Belinda Haymaker, MD

## 2019-05-19 NOTE — Consult Note (Signed)
Renal Service Consult Note [facility]  [name] 05/19/2019 Sol Blazing Requesting Physician:  Dr C. Owens Shark  Reason for Consult:  Acute renal failure HPI: The patient is a 43 y.o. year-old with hx of DM2, morbid obesity, HTN, COPD, bipolar d/o, anemia , L BKA , R toe amp presented on 6/11 with buttock pain/ drainage/ abscess. Went to OR and was admitted, had I&D mult abscesses per gen surg (L gluteal, left lower abd pannus). Creat was 1.01 on admit, up to 1.2, then 2.7 yest and 3.59 today.  Asked to see for AKI.    Patient got IV contrast on 6/11 with her CT pelvis.  Meds reviewed , getting PPI IV now po, got IV vanc 2gm 6/11 and 1.75 gm 6/12, IV zosyn, no nsaids or acei/ aRB, no other nephrotoxins.   Patient in pain, no sob or couhg, lying flat, fully alert     EChart >   2012 - panniculitis/ abscess, DM,, HTN  2013 - chest pain admit  2015 - R eye visual loss, ? Migraine  10/2016 - substancen abus3e, DM, +cocaine, SI/ depression  10/2017 - DFU w/ osteo  01/2018 - DFU w/ osteo, AKI resolved  03/2018 - L BKA, rehab  10/2018 - osteo R toe > toe amp  12/2018 = R foot cellulitis, recent fungal cx+ at outside hospitla getting Rx via PICC w/ micafungin       ROS  denies CP  no joint pain   no HA  no blurry vision  no rash  no diarrhea  no nausea/ vomiting  foley in place   Past Medical History  Past Medical History:  Diagnosis Date  . Acute osteomyelitis of ankle or foot, left ([name]) 01/31/2018  . Alveolar hypoventilation   . Anemia    not on iron pill  . Asthma   . Bipolar 2 disorder ([name])   . Carpal tunnel syndrome on right    recurrent  . Cellulitis 08/2010-08/2011  . Chronic pain   . COPD (chronic obstructive pulmonary disease) ([name])    Symbicort daily and Proventil as needed  . Costochondritis   . Depression   . Diabetes mellitus type II, uncontrolled ([name]) 2000   Type 2, Uncontrolled.Takes Lantus daily.Fasting blood sugar runs 150  .  Dizziness    occasionally  . Drug-seeking behavior   . GERD (gastroesophageal reflux disease)    takes Pantoprazole and Zantac daily  . Headache    migraine-last one about a yr ago.Topamax daily  . HLD (hyperlipidemia)    takes Atorvastatin daily  . Hypertension    takes Lisinopril and Coreg daily  . Morbid obesity ([name])   . Muscle spasm    takes Flexeril as needed  . Nocturia   . OSA on CPAP   . Peripheral neuropathy    takes Gabapentin daily  . Pneumonia    "walking" several yrs ago and as a baby (12/05/2018)  . Rectal fissure   . Restless leg   . Syncope 02/25/2016  . Urinary frequency   . Varicose veins    Right medial thigh and Left leg    Past Surgical History  Past Surgical History:  Procedure Laterality Date  . AMPUTATION Left 02/01/2018   Procedure: LEFT FOURTH AND 5TH TOE RAY AMPUTATION;  Surgeon: [name], MD;  Location: [name];  Service: Orthopedics;  Laterality: Left;  . AMPUTATION Left 03/03/2018   Procedure: LEFT BELOW KNEE AMPUTATION;  Surgeon: [name], MD;  Location:  [name] OR;  Service: Orthopedics;  Laterality: Left;  . CARPAL TUNNEL RELEASE Bilateral   . CESAREAN SECTION  2007  . INCISION AND DRAINAGE PERIRECTAL ABSCESS Left 05/18/2019   Procedure: IRRIGATION AND DEBRIDEMENT OF PANNIS ABSCESS, POSSIBLE DEBRIDEMENT OF BUTTOCK WOUND;  Surgeon: Donnie Mesa, MD;  Location: [name];  Service: General;  Laterality: Left;  . IRRIGATION AND DEBRIDEMENT BUTTOCKS Left 05/17/2019   Procedure: IRRIGATION AND DEBRIDEMENT BUTTOCKS;  Surgeon: Donnie Mesa, MD;  Location: [name];  Service: General;  Laterality: Left;  . KNEE ARTHROSCOPY Right 07/17/2010  . LEFT HEART CATHETERIZATION WITH CORONARY ANGIOGRAM N/A 07/27/2012   Procedure: LEFT HEART CATHETERIZATION WITH CORONARY ANGIOGRAM;  Surgeon: Sherren Mocha, MD;  Location: [facility] CATH LAB;  Service: Cardiovascular;  Laterality: N/A;  . MASS EXCISION N/A 06/29/2013   Procedure:  WIDE LOCAL EXCISION OF POSTERIOR NECK  ABSCESS;  Surgeon: Ralene Ok, MD;  Location: [place];  Service: General;  Laterality: N/A;  . REPAIR KNEE LIGAMENT Left    "fixed ligaments and chipped patella"  . right transmetatarsal amputation      Family History  Family History  Problem Relation Age of Onset  . Diabetes Mother   . Hyperlipidemia Mother   . Depression Mother   . Varicose Veins Mother   . Heart attack Paternal Uncle   . Heart disease Paternal Grandmother   . Heart attack Paternal Grandmother   . Heart attack Paternal Grandfather   . Heart disease Paternal Grandfather   . Heart attack Father   . Cancer Maternal Grandmother        COLON  . Hypertension Maternal Grandmother   . Hyperlipidemia Maternal Grandmother   . Diabetes Maternal Grandmother   . Other Maternal Grandfather        GUN SHOT   Social History  reports that she quit smoking about 25 years ago. Her smoking use included cigarettes. She has a 0.09 pack-year smoking history. She has never used smokeless tobacco. She reports current drug use. She reports that she does not drink alcohol. Allergies  Allergies  Allergen Reactions  . Kiwi Extract Shortness Of Breath and Swelling  . Cefepime     AKI, see records from [place] hospitalization in January 2020  . Trental [Pentoxifylline] Nausea And Vomiting  . Nubain [Nalbuphine Hcl] Other (See Comments)    "FEELS LIKE SOMETHING CRAWLING ON ME"   Home medications Prior to Admission medications   Medication Sig Start Date End Date Taking? Authorizing Provider  atorvastatin (LIPITOR) 20 MG tablet Take 1 tablet (20 mg total) by mouth daily. 03/16/18  Yes Angiulli, Lavon Paganini, PA-C  budesonide-formoterol (SYMBICORT) 160-4.5 MCG/ACT inhaler inhale 2 PUFFS into THE lungs 2 TIMES DAILY Patient taking differently: Inhale 2 puffs into the lungs 2 (two) times daily.  10/10/18  Yes Hensel, Jamal Collin, MD  Cholecalciferol 1000 units tablet Take 1 tablet (1,000 Units total) by mouth daily. 04/05/17  Yes [name], MD  cyclobenzaprine (FLEXERIL) 5 MG tablet TAKE 1 TABLET BY MOUTH 3 TIMES DAILY AS NEEDED FOR MUSCLE SPASMS Patient taking differently: Take 5 mg by mouth 3 (three) times daily as needed for muscle spasms.  05/09/19  Yes [name], MD  gabapentin (NEURONTIN) 600 MG tablet TAKE 2 TABLETS BY MOUTH 3 TIMES DAILY Patient taking differently: Take 1,200 mg by mouth 3 (three) times daily.  04/26/19  Yes [name], MD  HYDROcodone-acetaminophen (NORCO) 10-325 MG tablet Take 1 tablet by mouth every 8 (eight) hours as needed. Patient  taking differently: Take 1 tablet by mouth every 8 (eight) hours.  04/19/19  Yes [name], MD  Insulin Isophane & Regular Human (HUMULIN 70/30 MIX) (70-30) 100 UNIT/ML PEN Inject 70 Units into the skin daily with breakfast AND 35 Units daily with supper. 04/11/19  Yes Shamleffer, Melanie Crazier, MD  NYSTATIN powder Apply 1 Bottle topically daily as needed (yeast).  03/30/19  Yes [provider]  pantoprazole (PROTONIX) 40 MG tablet Take 1 tablet (40 mg total) by mouth daily. 03/16/18  Yes Angiulli, Lavon Paganini, PA-C  PROVENTIL HFA 108 (90 Base) MCG/ACT inhaler Inhale 1-2 puffs into the lungs every 6 (six) hours as needed for wheezing or shortness of breath. Patient taking differently: Inhale 2 puffs into the lungs every 4 (four) hours as needed for wheezing.  05/11/19  Yes [name], MD  ACCU-CHEK SOFTCLIX LANCETS lancets Use as instructed to check blood sugar 3 times per day Patient taking differently: Use as instructed to check blood sugar 2 times per day 03/16/18   Angiulli, Lavon Paganini, PA-C  Blood Glucose Monitoring Suppl (ACCU-CHEK NANO SMARTVIEW) w/Device KIT Use to check blood sugar 3 times daily 03/16/18   Angiulli, Lavon Paganini, PA-C  glucose blood (ACCU-CHEK GUIDE) test strip USE T0 CHECK BLOOD SUGAR 3 TIMES DAILY 03/16/18   Angiulli, Lavon Paganini, PA-C  HYDROcodone-acetaminophen (NORCO) 10-325 MG tablet Take 1 tablet by mouth every 8  (eight) hours as needed. Patient not taking: Reported on 05/17/2019 04/19/19   [name], MD  HYDROcodone-acetaminophen [facility]) 10-325 MG tablet Take 1 tablet by mouth every 8 (eight) hours as needed. Patient not taking: Reported on 05/08/2019 04/19/19   [name], MD   Liver Function Tests Recent Labs  Lab 05/17/19 0640  AST 10*  ALT 14  ALKPHOS 291*  BILITOT 0.2*  PROT 6.7  ALBUMIN 1.7*   No results for input(s): LIPASE, AMYLASE in the last 168 hours. CBC Recent Labs  Lab 05/17/19 0640 05/17/19 1631 05/18/19 1139 05/18/19 1930 05/19/19 1238  WBC 14.4* 16.2* 17.7* 15.4* 16.2*  NEUTROABS 10.0* 11.5*  --  12.9*  --   HGB 8.9* 9.3* 7.4* 6.3* 7.4*  HCT 28.6* 29.1* 24.1* 20.9* 24.3*  MCV 91.1 91.5 93.1 96.8 93.8  PLT 489* 482* 451* 376 009   Basic Metabolic Panel Recent Labs  Lab 05/17/19 0640 05/17/19 1631 05/18/19 1930 05/19/19 1151  NA 127* 133* 133* 130*  K 4.5 4.6 4.5 5.4*  CL 95* 102 108 102  CO2 22 21* 17* 18*  GLUCOSE 588* 263* 195* 278*  BUN _0 26*  CREATININE 1.01* 1.19* 2.72* 3.59*  CALCIUM 8.6* 8.7* 7.0* 7.6*   Iron/TIBC/Ferritin/ %Sat    Component Value Date/Time   IRON 16 (L) 05/17/2019 1631   TIBC 230 (L) 05/17/2019 1631   FERRITIN 579 (H) 05/17/2019 1631   IRONPCTSAT 7 (L) 05/17/2019 1631   IRONPCTSAT 32 08/05/2015 1114    Vitals:   05/19/19 0509 05/19/19 0517 05/19/19 0640 05/19/19 1334  BP: 135/62 130/67  116/68  Pulse: 92 94  82  Resp: 20   18  Temp: 98.9 F (37.2 C)   97.8 F (36.6 C)  TempSrc: Oral   Oral  SpO2: 94% 93%  94%  Weight:   (!) 146.3 kg   Height:        Exam Gen very obese wF, no distress, lying flat No rash, cyanosis or gangrene Sclera anicteric, throat clear  No jvd or bruits  Chest  clear bilat to bases, no rales/ wheezing RRR no MRG Abd soft ntnd no mass or ascites +bs  Very obese, LL abd wall dressing GU foley in place draining yellow urine w/ lots of debris MS no joint effusions  or deformity Ext trace 1+ RLE edema, no wounds or ulcers, L BKA, R TMA Neuro is alert, Ox 3 , nf    Home meds:  - gabapentin 1200 tid/ hydrocodone-aceta prn/ pantoprazole 40  - atorvastatin 20 / symbicort bid/ flexeril prn  - insulin 70/30 70 am and 35 pm   UA pending  Assessment/ Plan: 1. Acute renal failure - likely contrast nephropathy +/- hypotension. Also was getting IV vanc/ PPI/ zosyn, but doubt these playing a role.  Cont to hold vanc, dc PPI for now.  Supportive care.  Get renal US, bladder scan, UA , urine culture, urine lytes, daily labs for now.  2. SP I&D buttock abscess/ abd wall abscess - on 6/10 3. Morbid obesity 4. IDDM 5. HL 6. SP L BKA/ R TMA      Kelly Splinter  MD 05/19/2019, 3:47 PM

## 2019-05-19 NOTE — Progress Notes (Signed)
OT Cancellation Note  Patient Details Name: [name] MRN: [id number] DOB: 1976-01-14   Cancelled Treatment:    Reason Eval/Treat Not Completed: Patient declined, no reason specified. Pt significantly limited by pain and fatigue. Pt declined therapy this date, discussed premedicating pt for wound pain prior to OT/PT arrival for optimal pt participation. Will return as time allows and pt appropriate.   Dorinda Hill OTR/L Acute Rehabilitation Services Office: [place] 05/19/2019, 12:13 PM

## 2019-05-19 NOTE — Progress Notes (Signed)
Family Medicine Teaching Service Daily Progress Note Intern Pager: [phone]  Patient name: [name] Medical record number: [id number] Date of birth: 02/12/1976 Age: 43 y.o. Gender: female  Primary Care Provider: [name], MD Consultants: General Surgery  Code Status: Full  Pt Overview and Major Events to Date:  6/11 Admitted to FPTS, I&D Left Buttocks Abscess  Assessment and Plan: [name] is a 43 y.o. female presenting with left buttock abscess and severely elevated blood glucose. PMH is significant for left BKA, amputation all 5 digits right foot, type 2 diabetes with neuropathy, allergic rhinitis, morbid obesity, GERD, right carpal tunnel syndrome, left hand osteoarthritis, obesity hypoventilation syndrome   Left buttock abscess, 2 days S/P I&D Patient with large left-sided buttock abscess, I&D by surgery on 6/11.  Patient has been on vancomycin and Zosyn since 6/11, broadened to clindamycin on 6/12 due to necrosis noted on I&D.  Patient returned OR for pannus abscess drainage as well as further debridement of buttock abscess.  Patient had 4 episodes of emesis overnight, and was experiencing pain.  Also with increasing creatinine from 1.1-2.7, therefore pharmacy decreased dose of vancomycin.  Complicating factors, patient also lost IV and was unable to have another one reinserted.  She was unable to receive fluids or insulin drip overnight due to this.  WBC overnight 15.4.  She remained afebrile overnight, vitals within normal limits aside from BP 95/57 at 2200 that improved without fluid bolus.  Heart rate this a.m. 94, BP 130/67.  She has been satting well on room air.  Wound cultures from 6/12 with GPC and GPR.  Blood cultures collected 6/11 at 0721, NGTD. -surgery follow, appreciate recs - 2 days s/p I&D left buttock abscess - f/u wound cultues: GPC and GPR - f/u blood cultures: NGTD - cont vanc/zosyn (6/11- ), pending cultures - cont clindamycin (6/12- )  - place PICC  today - cont IVF @ 120 cc/hr when PICC in place - pain meds: tylenol q4h sch, dilaudid 18m q4h prn - nausea: 12.5 mg q6h prn - PT/OT - foley ordered on 6/12 to monitor UOP accurately  Nausea/Emesis Patient with 4 episodes of emesis overnight and continues have significant nausea.  She notes that previously she has had improvement in nausea with Phenergan.  Patient denies chest pain, is having upper abdominal pain that she states "feels like muscle soreness."  She states that she does not believe that she has had a bowel movement while she has been here, and did not have one in the few days leading up to her hospitalization.  Differential includes antibiotics, morphine, worsening infection, uremia, postop ileus, constipation, ACS.  Very low suspicion for ACS, but will obtain EKG to rule out acute changes.  Will change morphine to Dilaudid at same morphine equivalent dosing to assess for improvement with nausea.  Will obtain abdominal x-ray to assess for postop ileus and stool burden.  BMP pending to rule out uremia, CBC pending to monitor infection.  Patient remains on IV antibiotics. -Follow-up BMP, CBC -Abdominal x-ray -D/C Zofran, start Phenergan PRN -D/C morphine, start Dilaudid PRN -EKG: no acute changes  Panus abscess, 1 day S/P I&D Panus abscess found during examination on 6/12 and taken to OR same day for I&D.  Per op report, appeared to track medially and laterally, no superiorly or inferiorly.   - surgery to see - plan per above  AKI Cr increased from 1.1 to 2.7 on 6/12.  Baseline appears around 1 since September 2019.  Pharmacy contacted  and decreased vancomycin dose.  Patient had been receiving NS at 120 cc/hr but lost an IV overnight and was unable to replace and receive fluids.  Labs pending this AM.  UOP with 450cc recordered during night shift on 6/12 and nurse reports 150cc in foley bag this AM.  Patient will need repeat Cr this AM and likely another bolus of NS when PICC  placed. - place PICC - likely repeat 1L NS bolus and cont 120 cc/hr - monitor Cr closely, consider repeat BMP in PM pending Cr this AM  Hyperglycemia: Improved Hgb A1c 17.5 on 6/11.  Home meds: insulin 70/30 70 units in a.m., 35 units in p.m.  Patient restarted on insulin gtt on 6/12 due to inability to ensure adequate PO intake while NPO for surgery and importance of good glucose control in a patient with large wounds for proper healing.  Insulin gtt was held overnight when patient lost 1 IV at 0021.  CBG this AM 207. - Lantus 40u QD - mSSI - may require an addition 10u Lantus this PM pending CBGs - CBGs QAC/HS  Acute Encephalopathy Some concern for intermittent confusion on 6/12.  This AM patient conversing appropriately and A&Ox4.  Likely improving with improving infection. - cont to monitor  Anemia Hgb 10.9 on 6/2.  On admission was 8.9>>6.3 overnight following surgery.  Likely 2/2 blood loss during both procedures.  Patient received 1 unit pRBC overnight.  Post-transfusion H&H pending.  Patient did receive one dose of lovenox this AM ordered by surgery, but will hold now, given her significant anemia. -post-transfusion H&H -monitor CBC -hold DVT PPx until Hgb stable  Morbid obesity BMI 56.45.  Limited physical activity given amputations. - nutrition consult as outpatient - consider obesity medicine consult as outpatient as well - could also consider adding Victoza to DM regimen for glucose control and weight loss benefit  Bilateral amputations Left BKA, all toes amputated from right foot.  These wound sites are clean dry and intact, well-healed. - no intervention needed  Obesity hypoventilation syndrome Per chart review does not have diagnosis of sleep apnea, but given body habitus likely experiencing symptoms.  Could likely benefit from sleep study as an outpatient. - offer CPAP QHS - outpatient sleep study  Asthma Well-controlled.  Takes Symbicort and albuterol as  needed for symptoms.   [name] while inpatient (formulary)  Diabetic neuropathy Takes gabapentin 1200 mg 3 times daily.  Held while NPO and did not reorder after surgery given somnolence.   - can restart gabapentin on request  Elevated alk phos 291 on admission, this AM pending.  Likely 2/2 inflammation from large gluteal abscess. - could consider GGT or RUQ if suspicion arises for hepatic process  Severe protein calorie malnutrition Alb 1.7. Add ensure shakes when no longer npo.  FEN/GI: carb modified  PPx: holding given significant Anemia   Disposition: Pending clinical improvement, PT OT recs  Subjective:  Overnight, patient lost IV and was unable to receive insulin gtt or IV fluids.  Was also anemic to 6.3 and received 1u pRBC.    This AM, endorses nausea and emesis.  States that she is also having some left upper quadrant "muscle soreness" since surgery yesterday.  Notes she does not believe that she has had a bowel movement since prior to admission.  Notes that pain medicaiton improves pain, but "gets ahead of me because it takes them awhile to get it when I ask."  She denies chest pain and shortness of breath.  Objective: Temp:  [98.3 F (36.8 C)-99.5 F (37.5 C)] 98.9 F (37.2 C) (06/13 0509) Pulse Rate:  [91-104] 94 (06/13 0517) Resp:  [18-20] 20 (06/13 0509) BP: (94-135)/(53-78) 130/67 (06/13 0517) SpO2:  [93 %-100 %] 93 % (06/13 0517) Weight:  [140 kg-146.3 kg] 146.3 kg (06/13 0640)  Physical Exam:  General: 43 y.o. female in NAD Cardio: RRR no m/r/g Lungs: CTAB, no wheezing, no rhonchi, no crackles, no IWOB on RA Abdomen: Soft, mild TTP LUQ, TTP lower abdomen bilaterally, dressing over pannus abscess and dressing over left buttocks abscess Skin: warm and dry Extremities: left amputation at ankle, right mid foot amputations   Laboratory: Recent Labs  Lab 05/17/19 1631 05/18/19 1139 05/18/19 1930  WBC 16.2* 17.7* 15.4*  HGB 9.3* 7.4* 6.3*  HCT 29.1*  24.1* 20.9*  PLT 482* 451* 376   Recent Labs  Lab 05/17/19 0640 05/17/19 1631 05/18/19 1930  NA 127* 133* 133*  K 4.5 4.6 4.5  CL 95* 102 108  CO2 22 21* 17*  BUN _0 CREATININE 1.01* 1.19* 2.72*  CALCIUM 8.6* 8.7* 7.0*  PROT 6.7  --   --   BILITOT 0.2*  --   --   ALKPHOS 291*  --   --   ALT 14  --   --   AST 10*  --   --   GLUCOSE 588* 263* 195*     Imaging/Diagnostic Tests: [place] Ekg Site Rite  Result Date: 05/19/2019 If Site Rite image not attached, placement could not be confirmed due to current cardiac rhythm.   Meccariello, Bernita Raisin, DO 05/19/2019, 8:45 AM PGY-1, [place] Intern pager: [phone], text pages welcome

## 2019-05-19 NOTE — Progress Notes (Signed)
FPTS Interim Progress Note  Patient having worsening renal function on today's labs. Given history of needing temporary HD will consult nephrology. Spoke to Dr. Jonnie Finner who has accepted consult and will see patient.   [name], DO 05/19/2019, 1:02 PM PGY-2, [place] Medicine Service pager [phone]

## 2019-05-19 NOTE — Progress Notes (Signed)
PT Cancellation Note  Patient Details Name: [name] MRN: [id number] DOB: November 14, 1976   Cancelled Treatment:    Reason Eval/Treat Not Completed: Fatigue/lethargy limiting ability to participate;Other (comment).  Pt is not feeling up to attempting therapy today but talked with her about making sure she is optimal for PT to see including premedicating for wound pain.  Rehab eval tomorrow if able.   Ramond Dial 05/19/2019, 11:36 AM   Mee Hives, PT MS Acute Rehab Dept. Number: [place] and [place]

## 2019-05-19 NOTE — Progress Notes (Signed)
Ellison Hughs, RN aware that right arm PIV needs to be discontinued due to PICC line being placed in that arm.

## 2019-05-19 NOTE — Progress Notes (Signed)
Progress Note: General Surgery Service   Assessment/Plan: Active Problems:   Abscess of buttock, right   Fever   Abscess of skin of abdomen  s/p Procedure(s): IRRIGATION AND DEBRIDEMENT OF PANNIS ABSCESS, POSSIBLE DEBRIDEMENT OF BUTTOCK WOUND 05/18/2019  Gluteal abscess- s/p debridement 6/12  -continue abx  -continue dressing changes Abdominal pannus abscess s/p debridement 6/12  -continue abx  -continue dressing changes  DIabetes mellitus - glucose 87-451 in last 24h Family medicine with insulin IV boluses yesterday  Morbid obesity  FEN- carb modified diet VTE- start enoxaparin today, SCDs ID- vanc 6/11-->, zosyn 6/11--> Dispo- TBD      LOS: 2 days  Chief Complaint/Subjective: Tolerating diet, continued gluteal pain  Objective: Vital signs in last 24 hours: Temp:  [98.3 F (36.8 C)-99.5 F (37.5 C)] 98.9 F (37.2 C) (06/13 0509) Pulse Rate:  [91-104] 94 (06/13 0517) Resp:  [18-20] 20 (06/13 0509) BP: (94-135)/(53-78) 130/67 (06/13 0517) SpO2:  [93 %-100 %] 93 % (06/13 0517) Weight:  [140 kg-146.3 kg] 146.3 kg (06/13 0640) Last BM Date: 05/16/19  Intake/Output from previous day: 06/12 0701 - 06/13 0700 In: 2627.1 [I.V.:1712.1; Blood:315; IV Piggyback:600] Out: 525 [Urine:450; Blood:75] Intake/Output this shift: No intake/output data recorded.  Lungs: nonlabored  Cardiovascular: RRR  Abd: open wound with clean base  Back: left gluteal wound with clean base  Extremities: TMA and BKA  Neuro: AOx4  Lab Results: CBC  Recent Labs    05/18/19 1139 05/18/19 1930  WBC 17.7* 15.4*  HGB 7.4* 6.3*  HCT 24.1* 20.9*  PLT 451* 376   BMET Recent Labs    05/17/19 1631 05/18/19 1930  NA 133* 133*  K 4.6 4.5  CL 102 108  CO2 21* 17*  GLUCOSE 263* 195*  BUN 12 20  CREATININE 1.19* 2.72*  CALCIUM 8.7* 7.0*   PT/INR No results for input(s): LABPROT, INR in the last 72 hours. ABG No results for input(s): PHART, HCO3 in the last 72 hours.   Invalid input(s): PCO2, PO2  Studies/Results:  Anti-infectives: Anti-infectives (From admission, onward)   Start     Dose/Rate Route Frequency Ordered Stop   05/19/19 0015  piperacillin-tazobactam (ZOSYN) IVPB 2.25 g     2.25 g 100 mL/hr over 30 Minutes Intravenous Every 6 hours 05/19/19 0012     05/18/19 2106  vancomycin variable dose per unstable renal function (pharmacist dosing)      Does not apply See admin instructions 05/18/19 2106     05/18/19 1100  clindamycin (CLEOCIN) IVPB 600 mg     600 mg 100 mL/hr over 30 Minutes Intravenous Every 8 hours 05/18/19 1022     05/18/19 0900  vancomycin (VANCOCIN) 1,750 mg in sodium chloride 0.9 % 500 mL IVPB  Status:  Discontinued     1,750 mg 250 mL/hr over 120 Minutes Intravenous Every 24 hours 05/17/19 0759 05/18/19 2106   05/17/19 1630  piperacillin-tazobactam (ZOSYN) IVPB 3.375 g  Status:  Discontinued     3.375 g 12.5 mL/hr over 240 Minutes Intravenous Every 8 hours 05/17/19 1609 05/19/19 0011   05/17/19 0730  vancomycin (VANCOCIN) 2,000 mg in sodium chloride 0.9 % 500 mL IVPB     2,000 mg 250 mL/hr over 120 Minutes Intravenous  Once 05/17/19 0729 05/17/19 1130      Medications: Scheduled Meds: . acetaminophen  650 mg Oral Q4H  . atorvastatin  20 mg Oral Daily  . insulin regular  0-10 Units Intravenous TID WC  . mometasone-formoterol  2 puff Inhalation BID  .  ondansetron  4 mg Oral Once  . pantoprazole  40 mg Oral Daily  . sodium chloride flush  10-40 mL Intracatheter Q12H  . vancomycin variable dose per unstable renal function (pharmacist dosing)   Does not apply See admin instructions   Continuous Infusions: . sodium chloride Stopped (05/18/19 1703)  . clindamycin (CLEOCIN) IV 600 mg (05/19/19 0503)  . dextrose 5 % and 0.45% NaCl 150 mL/hr at 05/19/19 0452  . lactated ringers 10 mL/hr at 05/18/19 1347  . piperacillin-tazobactam (ZOSYN)  IV 2.25 g (05/19/19 0653)  . sodium chloride     PRN Meds:.dextrose, morphine  injection, ondansetron **OR** [DISCONTINUED] ondansetron (ZOFRAN) IV, sodium chloride flush  Mickeal Skinner, MD [facility]

## 2019-05-19 NOTE — Progress Notes (Signed)
Peripherally Inserted Central Catheter/Midline Placement  The IV Nurse has discussed with the patient and/or persons authorized to consent for the patient, the purpose of this procedure and the potential benefits and risks involved with this procedure.  The benefits include less needle sticks, lab draws from the catheter, and the patient may be discharged home with the catheter. Risks include, but not limited to, infection, bleeding, blood clot (thrombus formation), and puncture of an artery; nerve damage and irregular heartbeat and possibility to perform a PICC exchange if needed/ordered by physician.  Alternatives to this procedure were also discussed.  Bard Power PICC patient education guide, fact sheet on infection prevention and patient information card has been provided to patient /or left at bedside.    PICC/Midline Placement Documentation  PICC Double Lumen 05/19/19 PICC Right Cephalic 34 cm 0 cm (Active)  Indication for Insertion or Continuance of Line Prolonged intravenous therapies 05/19/19 1000  Exposed Catheter (cm) 0 cm 05/19/19 1000  Site Assessment Clean;Dry;Intact 05/19/19 1000  Lumen #1 Status Flushed;Blood return noted;Saline locked 05/19/19 1000  Lumen #2 Status Flushed;Blood return noted;Saline locked 05/19/19 1000  Dressing Type Transparent 05/19/19 1000  Dressing Status Clean;Dry;Intact;Antimicrobial disc in place 05/19/19 [place] checked and tightened 05/19/19 1000  Line Adjustment (NICU/IV Team Only) No 05/19/19 1000  Dressing Intervention New dressing 05/19/19 1000  Dressing Change Due 05/26/19 05/19/19 1000       Lorenza Cambridge 05/19/2019, 10:22 AM

## 2019-05-19 NOTE — Plan of Care (Signed)
  Problem: Activity: Goal: Risk for activity intolerance will decrease Outcome: Not Progressing  Decreased activity d/t pain. Patient encouraged to participate with ADLs.   Problem: Pain Managment: Goal: General experience of comfort will improve Outcome: Not Progressing  C/o pain, more with movement. Medication provided as ordered.   Problem: Skin Integrity: Goal: Risk for impaired skin integrity will decrease Outcome: Not Progressing  Wounds remain in place. Wound care provided as ordered.

## 2019-05-19 NOTE — Progress Notes (Signed)
Spoke with Altamese Dilling, RN concerning PICC placement. Patient able to sign consent. Will move forward today to place PICC.

## 2019-05-19 NOTE — Progress Notes (Signed)
Physical Therapy Wound Treatment Patient Details  Name: [name] MRN: [id number] Date of Birth: 12-08-1975  Today's Date: 05/19/2019 Time: 6269-4854 Time Calculation (min): 41 min  Subjective  Subjective: "If you have to," pt referring to hydrotherapy  Patient and Family Stated Goals: none stated Date of Onset: ("several weeks ago," per pt) Prior Treatments: s/p I&D 6/11, 6/12  Pain Score: 6/10  Wound Assessment  Wound / Incision (Open or Dehisced) 05/19/19 Buttocks Left (Active)  Dressing Type ABD;Barrier Film (skin prep);Moist to dry;Gauze (Comment) 05/19/19 1600  Dressing Changed Changed 05/19/19 1600  Dressing Status Clean;Dry;Intact 05/19/19 1600  Dressing Change Frequency Daily 05/19/19 1600  Site / Wound Assessment Black;Pink;Red;Yellow;Granulation tissue 05/19/19 1600  % Wound base Red or Granulating 50% 05/19/19 1600  % Wound base Yellow/Fibrinous Exudate 40% 05/19/19 1600  % Wound base Black/Eschar 10% 05/19/19 1600  % Wound base Other/Granulation Tissue (Comment) 0% 05/19/19 1600  Peri-wound Assessment Edema;Erythema (blanchable) 05/19/19 1600  Wound Length (cm) 9.5 cm 05/19/19 1600  Wound Width (cm) 6 cm 05/19/19 1600  Wound Depth (cm) 5 cm 05/19/19 1600  Wound Volume (cm^3) 285 cm^3 05/19/19 1600  Wound Surface Area (cm^2) 57 cm^2 05/19/19 1600  Margins Unattached edges (unapproximated) 05/19/19 1600  Drainage Amount Moderate 05/19/19 1600  Drainage Description No odor;Serosanguineous 05/19/19 1600  Non-staged Wound Description Partial thickness 05/19/19 1600  Treatment Debridement (Selective);Hydrotherapy (Pulse lavage);Packing (Saline gauze) 05/19/19 1600      Hydrotherapy Pulsed lavage therapy - wound location: left buttocks Pulsed Lavage with Suction (psi): 12 psi Pulsed Lavage with Suction - Normal Saline Used: 1000 mL Pulsed Lavage Tip: Tip with splash shield Selective Debridement Selective Debridement - Location: L buttocks Selective Debridement -  Tools Used: Forceps;Scissors Selective Debridement - Tissue Removed: Yellow necrotic tissue   Wound Assessment and Plan  Wound Therapy - Assess/Plan/Recommendations Wound Therapy - Clinical Statement: Pt presents to hydrotherapy with buttocks abscess s/p I&D. Will benefit from hydrotherapy for removal of nonviable tissue. Wound Therapy - Functional Problem List: decreased mobility Factors Delaying/Impairing Wound Healing: Diabetes Mellitus Hydrotherapy Plan: Debridement;Dressing change;Patient/family education;Pulsatile lavage with suction Wound Therapy - Frequency: 6X / week Wound Therapy - Follow Up Recommendations: Skilled nursing facility Wound Plan: see above  Wound Therapy Goals- Improve the function of patient's integumentary system by progressing the wound(s) through the phases of wound healing (inflammation - proliferation - remodeling) by: Decrease Necrotic Tissue to: 30 Decrease Necrotic Tissue - Progress: Goal set today Increase Granulation Tissue to: 70 Increase Granulation Tissue - Progress: Goal set today Goals/treatment plan/discharge plan were made with and agreed upon by patient/family: Yes Time For Goal Achievement: 7 days Wound Therapy - Potential for Goals: Good  Goals will be updated until maximal potential achieved or discharge criteria met.  Discharge criteria: when goals achieved, discharge from hospital, MD decision/surgical intervention, no progress towards goals, refusal/missing three consecutive treatments without notification or medical reason.  GP    Ellamae Sia, PT, DPT Acute Rehabilitation Services Pager [phone] Office [phone]   Willy Eddy 05/19/2019, 5:10 PM

## 2019-05-19 NOTE — Progress Notes (Signed)
Pharmacy Note  Spoke with Dr Pilar Plate w/ family medicine service who requests shorter infusion time for Zosyn until PICC can be established in order to facilitate several treatments with limited access.  Will change Zosyn to 2.25g IV Q6H and monitor.  Wynona Neat, PharmD, BCPS 05/19/2019 12:17 AM

## 2019-05-20 DIAGNOSIS — E1169 Type 2 diabetes mellitus with other specified complication: Secondary | ICD-10-CM

## 2019-05-20 DIAGNOSIS — A419 Sepsis, unspecified organism: Principal | ICD-10-CM

## 2019-05-20 DIAGNOSIS — Z89421 Acquired absence of other right toe(s): Secondary | ICD-10-CM

## 2019-05-20 DIAGNOSIS — Z89512 Acquired absence of left leg below knee: Secondary | ICD-10-CM

## 2019-05-20 DIAGNOSIS — E1152 Type 2 diabetes mellitus with diabetic peripheral angiopathy with gangrene: Secondary | ICD-10-CM

## 2019-05-20 DIAGNOSIS — E871 Hypo-osmolality and hyponatremia: Secondary | ICD-10-CM

## 2019-05-20 DIAGNOSIS — L0390 Cellulitis, unspecified: Secondary | ICD-10-CM

## 2019-05-20 DIAGNOSIS — L0231 Cutaneous abscess of buttock: Secondary | ICD-10-CM

## 2019-05-20 DIAGNOSIS — N179 Acute kidney failure, unspecified: Secondary | ICD-10-CM

## 2019-05-20 DIAGNOSIS — Z888 Allergy status to other drugs, medicaments and biological substances status: Secondary | ICD-10-CM

## 2019-05-20 DIAGNOSIS — Z87891 Personal history of nicotine dependence: Secondary | ICD-10-CM

## 2019-05-20 DIAGNOSIS — E1151 Type 2 diabetes mellitus with diabetic peripheral angiopathy without gangrene: Secondary | ICD-10-CM

## 2019-05-20 DIAGNOSIS — Z881 Allergy status to other antibiotic agents status: Secondary | ICD-10-CM

## 2019-05-20 DIAGNOSIS — Z91018 Allergy to other foods: Secondary | ICD-10-CM

## 2019-05-20 DIAGNOSIS — L02211 Cutaneous abscess of abdominal wall: Secondary | ICD-10-CM

## 2019-05-20 DIAGNOSIS — I96 Gangrene, not elsewhere classified: Secondary | ICD-10-CM

## 2019-05-20 DIAGNOSIS — R109 Unspecified abdominal pain: Secondary | ICD-10-CM

## 2019-05-20 LAB — CBC WITH DIFFERENTIAL/PLATELET
Abs Immature Granulocytes: 0.34 10*3/uL — ABNORMAL HIGH (ref 0.00–0.07)
Basophils Absolute: 0 10*3/uL (ref 0.0–0.1)
Basophils Relative: 0 %
Eosinophils Absolute: 0.2 10*3/uL (ref 0.0–0.5)
Eosinophils Relative: 1 %
HCT: 22.8 % — ABNORMAL LOW (ref 36.0–46.0)
Hemoglobin: 6.9 g/dL — CL (ref 12.0–15.0)
Immature Granulocytes: 3 %
Lymphocytes Relative: 19 %
Lymphs Abs: 2.5 10*3/uL (ref 0.7–4.0)
MCH: 28.6 pg (ref 26.0–34.0)
MCHC: 30.3 g/dL (ref 30.0–36.0)
MCV: 94.6 fL (ref 80.0–100.0)
Monocytes Absolute: 1.3 10*3/uL — ABNORMAL HIGH (ref 0.1–1.0)
Monocytes Relative: 10 %
Neutro Abs: 8.6 10*3/uL — ABNORMAL HIGH (ref 1.7–7.7)
Neutrophils Relative %: 67 %
Platelets: 396 10*3/uL (ref 150–400)
RBC: 2.41 MIL/uL — ABNORMAL LOW (ref 3.87–5.11)
RDW: 15 % (ref 11.5–15.5)
WBC: 12.9 10*3/uL — ABNORMAL HIGH (ref 4.0–10.5)
nRBC: 0 % (ref 0.0–0.2)

## 2019-05-20 LAB — BASIC METABOLIC PANEL
Anion gap: 11 (ref 5–15)
BUN: 29 mg/dL — ABNORMAL HIGH (ref 6–20)
CO2: 18 mmol/L — ABNORMAL LOW (ref 22–32)
Calcium: 7.7 mg/dL — ABNORMAL LOW (ref 8.9–10.3)
Chloride: 103 mmol/L (ref 98–111)
Creatinine, Ser: 3.25 mg/dL — ABNORMAL HIGH (ref 0.44–1.00)
GFR calc Af Amer: 19 mL/min — ABNORMAL LOW (ref >60)
GFR calc non Af Amer: 17 mL/min — ABNORMAL LOW (ref >60)
Glucose, Bld: 152 mg/dL — ABNORMAL HIGH (ref 70–99)
Potassium: 4.8 mmol/L (ref 3.5–5.1)
Sodium: 132 mmol/L — ABNORMAL LOW (ref 135–145)

## 2019-05-20 LAB — CBC
HCT: 24.8 % — ABNORMAL LOW (ref 36.0–46.0)
Hemoglobin: 7.7 g/dL — ABNORMAL LOW (ref 12.0–15.0)
MCH: 29.2 pg (ref 26.0–34.0)
MCHC: 31 g/dL (ref 30.0–36.0)
MCV: 93.9 fL (ref 80.0–100.0)
Platelets: 424 10*3/uL — ABNORMAL HIGH (ref 150–400)
RBC: 2.64 MIL/uL — ABNORMAL LOW (ref 3.87–5.11)
RDW: 15.4 % (ref 11.5–15.5)
WBC: 14.4 10*3/uL — ABNORMAL HIGH (ref 4.0–10.5)
nRBC: 0 % (ref 0.0–0.2)

## 2019-05-20 LAB — PREPARE RBC (CROSSMATCH)

## 2019-05-20 LAB — CULTURE, BLOOD (ROUTINE X 2)

## 2019-05-20 LAB — URINE CULTURE: Culture: NO GROWTH

## 2019-05-20 LAB — GLUCOSE, CAPILLARY
Glucose-Capillary: 169 mg/dL — ABNORMAL HIGH (ref 70–99)
Glucose-Capillary: 140 mg/dL — ABNORMAL HIGH (ref 70–99)
Glucose-Capillary: 134 mg/dL — ABNORMAL HIGH (ref 70–99)
Glucose-Capillary: 109 mg/dL — ABNORMAL HIGH (ref 70–99)

## 2019-05-20 MED ORDER — CEFAZOLIN SODIUM-DEXTROSE 2-4 GM/100ML-% IV SOLN
2.0000 g | Freq: Three times a day (TID) | INTRAVENOUS | Status: DC
  Administered 2019-05-20 – 2019-05-22 (×5): 2 g via INTRAVENOUS
  Filled 2019-05-20 (×6): qty 100

## 2019-05-20 MED ORDER — HYDROMORPHONE HCL 1 MG/ML IJ SOLN
1.0000 mg | Freq: Four times a day (QID) | INTRAMUSCULAR | Status: DC | PRN
  Administered 2019-05-20: 1 mg via INTRAVENOUS
  Filled 2019-05-20: qty 1

## 2019-05-20 MED ORDER — INSULIN ASPART 100 UNIT/ML ~~LOC~~ SOLN
0.0000 [IU] | Freq: Three times a day (TID) | SUBCUTANEOUS | Status: DC
  Administered 2019-05-20 (×2): 3 [IU] via SUBCUTANEOUS
  Administered 2019-05-21 (×3): 4 [IU] via SUBCUTANEOUS
  Administered 2019-05-22: 3 [IU] via SUBCUTANEOUS
  Administered 2019-05-22 – 2019-05-23 (×2): 4 [IU] via SUBCUTANEOUS
  Administered 2019-05-24: 3 [IU] via SUBCUTANEOUS
  Administered 2019-05-25 (×2): 4 [IU] via SUBCUTANEOUS

## 2019-05-20 MED ORDER — SODIUM CHLORIDE 0.9 % IV SOLN
100.0000 mg | Freq: Two times a day (BID) | INTRAVENOUS | Status: DC
  Administered 2019-05-20 – 2019-05-22 (×4): 100 mg via INTRAVENOUS
  Filled 2019-05-20 (×5): qty 100

## 2019-05-20 MED ORDER — INSULIN ASPART 100 UNIT/ML ~~LOC~~ SOLN: 0.0000 [IU] | Freq: Every day | SUBCUTANEOUS | Status: DC

## 2019-05-20 MED ORDER — SODIUM CHLORIDE 0.9 % IV SOLN
INTRAVENOUS | Status: DC
  Administered 2019-05-20 – 2019-05-21 (×2): via INTRAVENOUS

## 2019-05-20 MED ORDER — OXYCODONE HCL 5 MG PO TABS
5.0000 mg | ORAL_TABLET | Freq: Four times a day (QID) | ORAL | Status: DC | PRN
  Administered 2019-05-20 – 2019-05-22 (×8): 5 mg via ORAL
  Filled 2019-05-20 (×8): qty 1

## 2019-05-20 MED ORDER — SODIUM CHLORIDE 0.9% IV SOLUTION
Freq: Once | INTRAVENOUS | Status: AC
  Administered 2019-05-20: 10:00:00 via INTRAVENOUS

## 2019-05-20 NOTE — Progress Notes (Signed)
Occupational Therapy Evaluation Patient Details Name: [name] MRN: [id number] DOB: 1976-05-30 Today's Date: 05/20/2019    History of Present Illness Belinda Banksis a 43 y.o.femalepresenting with left buttock abscess and severely elevated blood glucose. PMH is significant forleft BKA, amputation all 5 digits right foot, type 2 diabetes with neuropathy, allergic rhinitis, morbid obesity, GERD, right carpal tunnel syndrome, left hand osteoarthritis, obesity hypoventilation syndrome   Clinical Impression   Pt limited during session secondary to pain. Pt's home setup information taken from chart, pt highly distracted by pain upon OT arrival, unsure pt's prior level of functioning . Pt limited to bed level exercises due to pain. Nsg provided pain medication during session. Pt currently requires modA for rolling in bed, pt unable to tolerate sitting upright in supported bed. Pt limited to bed level exercises due to pain. Nsg provided pain medication during session. Pt unable to eat full meal before falling asleep. Pt required setupA for eating and grooming. Due to decline in current level of function, pt would benefit from acute OT to address established goals to facilitate safe D/C to venue listed below. At this time, recommend SNF follow-up. Will continue to follow acutely.     Follow Up Recommendations  SNF;Supervision/Assistance - 24 hour    Equipment Recommendations  3 in 1 bedside commode;Hospital bed    Recommendations for Other Services       Precautions / Restrictions Precautions Precautions: Fall Restrictions Weight Bearing Restrictions: No      Mobility Bed Mobility Overal bed mobility: Needs Assistance Bed Mobility: Rolling Rolling: Mod assist         General bed mobility comments: Rolled L>R to reposition pt on back to take pain meds, pt with increased pain, crying;rolled R>L to alleviate pain  Transfers                 General transfer comment: deferred  due to pt safety     Balance Overall balance assessment: (deferred)                                         ADL either performed or assessed with clinical judgement   ADL Overall ADL's : Needs assistance/impaired Eating/Feeding: Set up;Sitting;Bed level   Grooming: Set up;Sitting;Bed level   Upper Body Bathing: Sitting;Bed level;Maximal assistance   Lower Body Bathing: Total assistance;+2 for physical assistance;+2 for safety/equipment;Bed level   Upper Body Dressing : Total assistance;Bed level   Lower Body Dressing: Total assistance;+2 for physical assistance;+2 for safety/equipment;Bed level   Toilet Transfer: Total assistance;+2 for physical assistance;+2 for safety/equipment   Toileting- Clothing Manipulation and Hygiene: Total assistance;+2 for physical assistance;+2 for safety/equipment;Bed level         General ADL Comments: pt limited to bed level ADL secondary to pain      Vision Baseline Vision/History: Wears glasses Patient Visual Report: No change from baseline       Perception     Praxis      Pertinent Vitals/Pain Pain Assessment: 0-10 Pain Score: 9  Pain Location: left buttocks Pain Descriptors / Indicators: Constant;Moaning;Guarding;Crying Pain Intervention(s): Limited activity within patient's tolerance;RN gave pain meds during session     Hand Dominance Right   Extremity/Trunk Assessment Upper Extremity Assessment Upper Extremity Assessment: Generalized weakness   Lower Extremity Assessment Lower Extremity Assessment: Defer to PT evaluation;RLE deficits/detail;LLE deficits/detail RLE Deficits / Details: R toe amputation RLE: Unable to fully  assess due to pain RLE Sensation: (increased sensitivity to touch) LLE Deficits / Details: LBKA LLE: Unable to fully assess due to pain LLE Sensation: decreased light touch(highly sensitive to touch) LLE Coordination: decreased fine motor;decreased gross motor       Communication  Communication Communication: No difficulties   Cognition Arousal/Alertness: Awake/alert Behavior During Therapy: Agitated;WFL for tasks assessed/performed Overall Cognitive Status: Within Functional Limits for tasks assessed                                 General Comments: pt reported she is "agitated with people, mainly her mother-in-law"    General Comments  pt limited secondary to pain;educated pt on importance of mobility while in bed to maintain strength      Exercises     Shoulder Instructions      Home Living Family/patient expects to be discharged to:: Private residence Living Arrangements: Spouse/significant other;Children Available Help at Discharge: Family;Available PRN/intermittently Type of Home: House Home Access: Stairs to enter [employer] of Steps: 2 Entrance Stairs-Rails: None Home Layout: One level     Bathroom Shower/Tub: Tub/shower unit;Curtain   Bathroom Toilet: Standard Bathroom Accessibility: No   Home Equipment: Walker - 2 wheels   Additional Comments: home setup information from pt's chart.       Prior Functioning/Environment Level of Independence: Needs assistance        Comments: unsure, pt highly distracted by pain         OT Problem List: Decreased strength;Decreased range of motion;Decreased activity tolerance;Impaired balance (sitting and/or standing);Decreased safety awareness;Decreased knowledge of use of DME or AE;Decreased knowledge of precautions;Cardiopulmonary status limiting activity;Impaired sensation;Impaired tone;Pain      OT Treatment/Interventions: Therapeutic exercise;Self-care/ADL training;Energy conservation;DME and/or AE instruction;Modalities;Therapeutic activities;Patient/family education;Balance training    OT Goals(Current goals can be found in the care plan section) Acute Rehab OT Goals Patient Stated Goal: to wash up without pain OT Goal Formulation: With patient Time For Goal  Achievement: 06/03/19 Potential to Achieve Goals: Fair ADL Goals Pt Will Perform Grooming: with modified independence;bed level;sitting Pt Will Perform Upper Body Dressing: with supervision;sitting Pt Will Perform Lower Body Dressing: with supervision;with adaptive equipment;sitting/lateral leans  OT Frequency: Min 3X/week   Barriers to D/C: Decreased caregiver support  pt's husband works during th eday       Co-evaluation              AM-PAC OT "6 Clicks" Daily Activity     Outcome Measure Help from another person eating meals?: A Little Help from another person taking care of personal grooming?: A Lot Help from another person toileting, which includes using toliet, bedpan, or urinal?: Total Help from another person bathing (including washing, rinsing, drying)?: Total Help from another person to put on and taking off regular upper body clothing?: A Lot Help from another person to put on and taking off regular lower body clothing?: Total 6 Click Score: 10   End of Session Nurse Communication: Mobility status;Other (comment)(pt present during session to provide pt with medications)  Activity Tolerance: Patient limited by pain Patient left: in bed;with call bell/phone within reach;with nursing/sitter in room  OT Visit Diagnosis: Unsteadiness on feet (R26.81);Other abnormalities of gait and mobility (R26.89);Muscle weakness (generalized) (M62.81);Pain Pain - part of body: (L buttocks)                Time: 6195-0932 OT Time Calculation (min): 17 min Charges:  OT General Charges $  OT Visit: 1 Visit OT Evaluation $OT Eval High Complexity: 1 High  Dorinda Hill OTR/L Acute Rehabilitation Services Office: [place] 05/20/2019, 9:59 AM

## 2019-05-20 NOTE — Progress Notes (Signed)
Packed with moist gauze, then 4x4 on top with a abd pain between paniculum and mons pubis.

## 2019-05-20 NOTE — Progress Notes (Signed)
PT Cancellation Note  Patient Details Name: Magdalen Cabana MRN: [id number] DOB: [birth year]   Cancelled Treatment:    Reason Eval/Treat Not Completed: Pain limiting ability to participate   Duncan Dull 05/20/2019, 12:45 PM

## 2019-05-20 NOTE — Progress Notes (Signed)
Pt has home unit and places self on/off as needed. RT will monitor as needed.

## 2019-05-20 NOTE — Consult Note (Signed)
[facility] for Infectious Disease       Reason for Consult: abscess    Referring Physician: Dr. Owens Shark  Active Problems:   AKI (acute kidney injury) ([place])   Abscess of buttock, right   Fever   Abscess of skin of abdomen   . acetaminophen  650 mg Oral Q4H  . atorvastatin  20 mg Oral Daily  . Chlorhexidine Gluconate Cloth  6 each Topical Q0600  . insulin aspart  0-20 Units Subcutaneous TID WC  . insulin aspart  0-5 Units Subcutaneous QHS  . insulin glargine  40 Units Subcutaneous Daily  . mometasone-formoterol  2 puff Inhalation BID  . sodium chloride flush  10-40 mL Intracatheter Q12H    Recommendations: Doxycycline and cefazolin Can use oral doxy and Keflex at discharge  Assessment: She has two abscesses, one on the left buttock that was drained on 6/11 (purulence, necrotic tissue) and again on 6/12 (no purulence, necrotic muscle and adipose tissue) then another abscess on her abdominal wall debrided on 6/12. Both in the setting of very poorly controlled type 2 diabetes with A1C of 17.5.  No growth of cultures but typically Staph or Strep.     New renal failure after contrast, antibiotics, ppi   Antibiotics: Vancomycin, piperacillin tazobactam, clindamycin  HPI: [name] is a 43 y.o. female with morbid obesity, poorly controlled DM who came in with fever and found the abscesses as above.  Febrile to 102, leukocytosis of 14.4 initially and started on broad spectrum antibiotics with vancomycin, piptazo then clindamycin added.  Debrided by Dr. Georgette Dover and noted some necrotic tissue.  No culture growth.  Now afebrile > 48 hours, WBC trending down.  Developed significant renal failure and vancomycin has been held.  She reports poor po intake and doesn't feel she can keep oral medications down.  Consulted for antibiotic recommendations.     Review of Systems:  Constitutional: positive for anorexia or negative for fevers and chills Gastrointestinal: positive for nausea,  negative for diarrhea Integument/breast: negative for rash Hematologic/lymphatic: negative for lymphadenopathy All other systems reviewed and are negative    Past Medical History:  Diagnosis Date  . Acute osteomyelitis of ankle or foot, left ([place]) 01/31/2018  . Alveolar hypoventilation   . Anemia    not on iron pill  . Asthma   . Bipolar 2 disorder ([place])   . Carpal tunnel syndrome on right    recurrent  . Cellulitis 08/2010-08/2011  . Chronic pain   . COPD (chronic obstructive pulmonary disease) ([name])    Symbicort daily and Proventil as needed  . Costochondritis   . Depression   . Diabetes mellitus type II, uncontrolled ([place]) 2000   Type 2, Uncontrolled.Takes Lantus daily.Fasting blood sugar runs 150  . Dizziness    occasionally  . Drug-seeking behavior   . GERD (gastroesophageal reflux disease)    takes Pantoprazole and Zantac daily  . Headache    migraine-last one about a yr ago.Topamax daily  . HLD (hyperlipidemia)    takes Atorvastatin daily  . Hypertension    takes Lisinopril and Coreg daily  . Morbid obesity ([place])   . Muscle spasm    takes Flexeril as needed  . Nocturia   . OSA on CPAP   . Peripheral neuropathy    takes Gabapentin daily  . Pneumonia    "walking" several yrs ago and as a baby (12/05/2018)  . Rectal fissure   . Restless leg   . Syncope 02/25/2016  .  Urinary frequency   . Varicose veins    Right medial thigh and Left leg     Social History   Tobacco Use  . Smoking status: Former Smoker    Packs/day: 0.30    Years: 0.30    Pack years: 0.09    Types: Cigarettes    Quit date: 12/06/1993    Years since quitting: 25.4  . Smokeless tobacco: Never Used  Substance Use Topics  . Alcohol use: No    Alcohol/week: 0.0 standard drinks  . Drug use: Yes    Comment: OD attempts on home meds      Family History  Problem Relation Age of Onset  . Diabetes Mother   . Hyperlipidemia Mother   . Depression Mother   . Varicose Veins Mother   . Heart  attack Paternal Uncle   . Heart disease Paternal Grandmother   . Heart attack Paternal Grandmother   . Heart attack Paternal Grandfather   . Heart disease Paternal Grandfather   . Heart attack Father   . Cancer Maternal Grandmother        COLON  . Hypertension Maternal Grandmother   . Hyperlipidemia Maternal Grandmother   . Diabetes Maternal Grandmother   . Other Maternal Grandfather        GUN SHOT    Allergies  Allergen Reactions  . Kiwi Extract Shortness Of Breath and Swelling  . Cefepime     AKI, see records from [facility] hospitalization in January 2020  . Trental [Pentoxifylline] Nausea And Vomiting  . 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; right foot with previous amputation and no erythema  Lab Results  Component Value Date   WBC 12.9 (H) 05/20/2019   HGB 6.9 (LL) 05/20/2019   HCT 22.8 (L) 05/20/2019   MCV 94.6 05/20/2019   PLT 396 05/20/2019    Lab Results  Component Value Date   CREATININE 3.25 (H) 05/20/2019   BUN 29 (H) 05/20/2019   NA 132 (L) 05/20/2019   K 4.8 05/20/2019   CL 103 05/20/2019   CO2 18 (L) 05/20/2019    Lab Results  Component Value Date   ALT 14 05/17/2019   AST 10 (L) 05/17/2019   ALKPHOS 291 (H) 05/17/2019     Microbiology: Recent Results (from the past 240 hour(s))  Novel Coronavirus, NAA ([facility])     Status: None   Collection Time: 05/11/19  2:35 PM  Result Value Ref Range Status   SARS-CoV-2, NAA Not Detected Not Detected Final    Comment: This test was developed and its performance  characteristics determined by [employer]. This test has not been FDA cleared or approved. This test has been authorized by FDA under an Emergency Use Authorization (EUA). This test is only authorized for the duration of time the declaration that circumstances exist justifying the authorization of the emergency use of in vitro diagnostic tests for detection of SARS-CoV-2 virus and/or diagnosis of COVID-19 infection under section 564(b)(1) of the Act, 21 U.S.C. 481EHU-3(J)(4), unless the authorization is  terminated or revoked sooner. When diagnostic testing is negative, the possibility of a false negative result should be considered in the context of a patient's recent exposures and the presence of clinical signs and symptoms consistent with COVID-19. An individual without symptoms of COVID-19 and who is not shedding SARS-CoV-2 virus would expect to have a negative (not detected) result in this assay.   Blood culture (routine x 2)     Status: None (Preliminary result)   Collection Time: 05/17/19  7:21 AM   Specimen: BLOOD  Result Value Ref Range Status   Specimen Description BLOOD BLOOD RIGHT FOREARM  Final   Special Requests   Final    BOTTLES DRAWN AEROBIC AND ANAEROBIC Blood Culture adequate volume   Culture  Setup Time PENDING  Incomplete   Culture   Final    NO GROWTH 3 DAYS Performed at [facility], [address] [place], [place] [address]    Report Status PENDING  Incomplete  Blood culture (routine x 2)     Status: None (Preliminary result)   Collection Time: 05/17/19  7:26 AM   Specimen: BLOOD  Result Value Ref Range Status   Specimen Description BLOOD RIGHT ANTECUBITAL  Final   Special Requests   Final    BOTTLES DRAWN AEROBIC AND ANAEROBIC Blood Culture results may not be optimal due to an excessive volume of blood received in culture bottles   Culture  Setup Time PENDING  Incomplete   Culture   Final    NO GROWTH 3 DAYS Performed at [facility], [place] [address], [place], [place] [address]    Report Status PENDING  Incomplete  SARS Coronavirus 2     Status: None   Collection Time: 05/17/19  8:46 AM  Result Value Ref Range Status   SARS Coronavirus 2 NOT DETECTED NOT DETECTED Final    Comment: (NOTE) SARS-CoV-2 target nucleic acids are NOT DETECTED. The SARS-CoV-2 RNA is generally detectable in upper and lower respiratory specimens during the acute phase of infection.  Negative  results do not preclude SARS-CoV-2 infection, do not rule out co-infections with other pathogens, and should not be used as the sole basis for treatment or other patient management decisions.  Negative results must be combined with clinical observations, patient history, and epidemiological information. The expected result is Not Detected. Fact Sheet for Patients: http://www.biofiredefense.com/wp-content/uploads/2020/03/BIOFIRE-COVID -19-patients.pdf Fact Sheet for Healthcare Providers: http://www.biofiredefense.com/wp-content/uploads/2020/03/BIOFIRE-COVID -19-hcp.pdf This test is not yet approved or cleared by the [place] and  has been authorized for detection and/or diagnosis of SARS-CoV-2 by FDA under an Emergency Use Authorization (EUA).  This EUA will remain in effec t (meaning this test can be used) for the duration of  the COVID-19 declaration under Section 564(b)(1) of the Act, 21 U.S.C. section 360bbb-3(b)(1), unless the authorization is terminated or revoked sooner. Performed at [facility], [place] [address], [place], [place] [address]   Aerobic/Anaerobic Culture (surgical/deep wound)     Status: None (Preliminary result)   Collection Time: 05/17/19  1:55 PM   Specimen: Buttocks; Abscess  Result Value Ref Range Status   Specimen Description BUTTOCKS  Final   Special Requests NONE  Final   Gram Stain   Final    MODERATE WBC PRESENT, PREDOMINANTLY PMN ABUNDANT GRAM POSITIVE COCCI ABUNDANT GRAM POSITIVE RODS  Performed at [facility], [place] [address], [place], [place] [address]    Culture   Final    FEW NORMAL SKIN FLORA NO  ANAEROBES ISOLATED; CULTURE IN PROGRESS FOR 5 DAYS    Report Status PENDING  Incomplete    Thayer Headings, [facility] for Infectious Disease [place] [email or web address] 05/20/2019, 1:43 PM

## 2019-05-20 NOTE — Progress Notes (Signed)
Pharmacy Antibiotic Note  [name] is a 43 y.o. female on day #3 Vancomycin and Zosyn for L buttocks abscess. S/p I&D on 6/11. Temp to 102 today, purulent material noted expressed from midline abscess, for possible I&D today. Pharmacy has been consulted for Clindamycin dosing.  6/14 Update: ID consulted, recommending cefazolin and doxycycline. Pharmacy consulted to dose cefazolin.  Plan: DC Clindamycin and Zosyn Start doxycycline per MD Start Cefazolin 2g q8hrs Monitor renal function, culture data, clinical progress    Height: 5\' 2"  (157.5 cm) Weight: (!) 339 lb 1.1 oz (153.8 kg) IBW/kg (Calculated) : 50.1  Temp (24hrs), Avg:98.1 F (36.7 C), Min:97.5 F (36.4 C), Max:98.7 F (37.1 C)  Recent Labs  Lab 05/17/19 1631 05/18/19 1139 05/18/19 1930 05/19/19 1151 05/19/19 1238 05/19/19 1821 05/20/19 0350  WBC 16.2* 17.7* 15.4*  --  16.2* 15.2* 12.9*  CREATININE 1.19*  --  2.72* 3.59*  --  3.57* 3.25*    Estimated Creatinine Clearance: 32.9 mL/min (A) (by C-G formula based on SCr of 3.25 mg/dL (H)).    Allergies  Allergen Reactions  . Kiwi Extract Shortness Of Breath and Swelling  . Cefepime     AKI, see records from [facility] hospitalization in January 2020  . Trental [Pentoxifylline] Nausea And Vomiting  . Nubain [Nalbuphine Hcl] Other (See Comments)    "FEELS LIKE SOMETHING CRAWLING ON ME"    Antimicrobials this admission:  Vancomycin  6/11>>  Zosyn 6/11>>  Clindamycin 6/12>>  Dose adjustments this admission:  n/a  Microbiology results:  6/11 blood x 2 - no growth < 24 hrs to date  6/11 buttocks (deep/surgical wound) - abundant GPC and GPR, pending  6/11 COVID: negative  Thank you for allowing pharmacy to be a part of this patient's care.  Janae Bridgeman, PharmD PGY1 Pharmacy Resident Phone: [phone] 05/20/2019 1:58 PM

## 2019-05-20 NOTE — Progress Notes (Signed)
PT Cancellation Note  Patient Details Name: [name] MRN: [id number] DOB: [birth year]   Cancelled Treatment:    Reason Eval/Treat Not Completed: Medical issues which prohibited therapy(low Hgb 6.9 )   Duncan Dull 05/20/2019, 7:02 AM

## 2019-05-20 NOTE — Progress Notes (Signed)
[facility] Progress Note  Subjective: no new /co's, 550 UOP yest and 1200 today, creat down 3.25 today, no SOB or cough  Vitals:   05/20/19 1300 05/20/19 1405 05/20/19 1406 05/20/19 1636  BP: 122/78 122/65 122/65 106/62  Pulse:  90 90 87  Resp: 16 16 16 18   Temp: 98.7 F (37.1 C) 98.7 F (37.1 C) 98.7 F (37.1 C)   TempSrc: Oral Oral    SpO2: 95% 100%  92%  Weight:      Height:        Inpatient medications: . acetaminophen  650 mg Oral Q4H  . atorvastatin  20 mg Oral Daily  . Chlorhexidine Gluconate Cloth  6 each Topical Q0600  . insulin aspart  0-20 Units Subcutaneous TID WC  . insulin aspart  0-5 Units Subcutaneous QHS  . insulin glargine  40 Units Subcutaneous Daily  . mometasone-formoterol  2 puff Inhalation BID  . sodium chloride flush  10-40 mL Intracatheter Q12H   . sodium chloride 150 mL/hr at 05/19/19 1806  .  ceFAZolin (ANCEF) IV    . doxycycline (VIBRAMYCIN) IV 100 mg (05/20/19 1555)  . lactated ringers 10 mL/hr at 05/18/19 1347  . sodium chloride     dextrose, HYDROmorphone (DILAUDID) injection, oxyCODONE, promethazine, sodium chloride flush, sodium chloride flush    Exam: Gen very obese wF, no distress, lying flat No rash, cyanosis or gangrene Sclera anicteric, throat clear  No jvd or bruits  Chest clear bilat to bases, no rales/ wheezing RRR no MRG Abd soft ntnd no mass or ascites +bs  Very obese, LL abd wall dressing GU foley in place draining yellow urine w/ lots of debris MS no joint effusions or deformity Ext trace 1+ RLE edema, no wounds or ulcers, L BKA, R TMA Neuro is alert, Ox 3 , nf    Home meds:  - gabapentin 1200 tid/ hydrocodone-aceta prn/ pantoprazole 40  - atorvastatin 20 / symbicort bid/ flexeril prn  - insulin 70/30 70 am and 35 pm   UA 20-50 rbc/ > 50 wbc, rare bact, prot 100  Renal US > 10- 12 cm kidneys no hydro  UNa 14 , UCr 102  Assessment/ Plan: 1. Acute renal failure - 2/2 IV contrast +/-  hypotension. Also was getting IV vanc/ PPI/ zosyn, but doubt these played a role.  Cont supportive care, has good UOP and creat down today, likely recovering. Add NS at 50 /hr.   Will follow.  2. SP I&D buttock abscess/ abd wall abscess - on 6/10 3. Morbid obesity 4. IDDM 5. HL 6. SP L BKA/ R TMA   [facility] 05/20/2019, 4:51 PM  Iron/TIBC/Ferritin/ %Sat    Component Value Date/Time   IRON 16 (L) 05/17/2019 1631   TIBC 230 (L) 05/17/2019 1631   FERRITIN 579 (H) 05/17/2019 1631   IRONPCTSAT 7 (L) 05/17/2019 1631   IRONPCTSAT 32 08/05/2015 1114   Recent Labs  Lab 05/17/19 0640  05/20/19 0350  NA 127*   < > 132*  K 4.5   < > 4.8  CL 95*   < > 103  CO2 22   < > 18*  GLUCOSE 588*   < > 152*  BUN 15   < > 29*  CREATININE 1.01*   < > 3.25*  CALCIUM 8.6*   < > 7.7*  ALBUMIN 1.7*  --   --    < > = values in this interval not displayed.   Recent  Labs  Lab 05/17/19 0640  AST 10*  ALT 14  ALKPHOS 291*  BILITOT 0.2*  PROT 6.7   Recent Labs  Lab 05/20/19 0350  WBC 12.9*  HGB 6.9*  HCT 22.8*  PLT 396
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
## 2019-05-20 NOTE — Progress Notes (Signed)
Progress Note: General Surgery Service   Assessment/Plan: Active Problems:   AKI (acute kidney injury) ([place])   Abscess of buttock, right   Fever   Abscess of skin of abdomen  s/p Procedure(s): IRRIGATION AND DEBRIDEMENT OF PANNIS ABSCESS, POSSIBLE DEBRIDEMENT OF BUTTOCK WOUND 05/18/2019   Gluteal abscess- s/p debridement 6/12             -continue abx             -continue dressing changes Abdominal pannus abscess s/p debridement 6/12             -continue abx             -continue dressing changes  DIabetes mellitus - glucose 87-451 in last 24h Family medicine with insulin IV boluses yesterday  Morbid obesity  FEN- carb modified diet VTE- start enoxaparin today, SCDs ID- vanc 6/11-->, zosyn 6/11--> Dispo- TBD   LOS: 3 days  Chief Complaint/Subjective: Continued buttock pain, drank some liquids  Objective: Vital signs in last 24 hours: Temp:  [97.5 F (36.4 C)-98 F (36.7 C)] 98 F (36.7 C) (06/14 0453) Pulse Rate:  [82-92] 92 (06/14 0453) Resp:  [18] 18 (06/14 0453) BP: (116-150)/(68-87) 150/87 (06/14 0453) SpO2:  [94 %-98 %] 98 % (06/14 0453) Weight:  [153.8 kg] 153.8 kg (06/14 0539) Last BM Date: 05/16/19  Intake/Output from previous day: 06/13 0701 - 06/14 0700 In: 3834.5 [P.O.:240; I.V.:3298; IV Piggyback:296.4] Out: 550 [Urine:550] Intake/Output this shift: No intake/output data recorded.  Lungs: nonlabored  Cardiovascular: RRR  Abd: lower wound with clean base  Back: large left open gluteal wound with some fibrinous debris  Extremities: no edema  Neuro: AOx4  Lab Results: CBC  Recent Labs    05/19/19 1821 05/20/19 0350  WBC 15.2* 12.9*  HGB 7.3* 6.9*  HCT 23.6* 22.8*  PLT 384 396   BMET Recent Labs    05/19/19 1821 05/20/19 0350  NA 130* 132*  K 5.2* 4.8  CL 103 103  CO2 17* 18*  GLUCOSE 201* 152*  BUN 29* 29*  CREATININE 3.57* 3.25*  CALCIUM 7.8* 7.7*   PT/INR No results for input(s): LABPROT, INR in the last 72  hours. ABG No results for input(s): PHART, HCO3 in the last 72 hours.  Invalid input(s): PCO2, PO2  Studies/Results:  Anti-infectives: Anti-infectives (From admission, onward)   Start     Dose/Rate Route Frequency Ordered Stop   05/19/19 1800  piperacillin-tazobactam (ZOSYN) IVPB 3.375 g     3.375 g 12.5 mL/hr over 240 Minutes Intravenous Every 8 hours 05/19/19 1252     05/19/19 0015  piperacillin-tazobactam (ZOSYN) IVPB 2.25 g  Status:  Discontinued     2.25 g 100 mL/hr over 30 Minutes Intravenous Every 6 hours 05/19/19 0012 05/19/19 1252   05/18/19 2106  vancomycin variable dose per unstable renal function (pharmacist dosing)  Status:  Discontinued      Does not apply See admin instructions 05/18/19 2106 05/19/19 1618   05/18/19 1100  clindamycin (CLEOCIN) IVPB 600 mg     600 mg 100 mL/hr over 30 Minutes Intravenous Every 8 hours 05/18/19 1022     05/18/19 0900  vancomycin (VANCOCIN) 1,750 mg in sodium chloride 0.9 % 500 mL IVPB  Status:  Discontinued     1,750 mg 250 mL/hr over 120 Minutes Intravenous Every 24 hours 05/17/19 0759 05/18/19 2106   05/17/19 1630  piperacillin-tazobactam (ZOSYN) IVPB 3.375 g  Status:  Discontinued     3.375 g 12.5 mL/hr  over 240 Minutes Intravenous Every 8 hours 05/17/19 1609 05/19/19 0011   05/17/19 0730  vancomycin (VANCOCIN) 2,000 mg in sodium chloride 0.9 % 500 mL IVPB     2,000 mg 250 mL/hr over 120 Minutes Intravenous  Once 05/17/19 0729 05/17/19 1130      Medications: Scheduled Meds: . sodium chloride   Intravenous Once  . acetaminophen  650 mg Oral Q4H  . atorvastatin  20 mg Oral Daily  . Chlorhexidine Gluconate Cloth  6 each Topical Q0600  . insulin aspart  0-15 Units Subcutaneous TID WC  . insulin glargine  40 Units Subcutaneous Daily  . mometasone-formoterol  2 puff Inhalation BID  . sodium chloride flush  10-40 mL Intracatheter Q12H   Continuous Infusions: . sodium chloride 150 mL/hr at 05/19/19 1806  . clindamycin (CLEOCIN)  IV 600 mg (05/20/19 0400)  . lactated ringers 10 mL/hr at 05/18/19 1347  . piperacillin-tazobactam (ZOSYN)  IV 3.375 g (05/20/19 0446)  . sodium chloride     PRN Meds:.dextrose, HYDROmorphone (DILAUDID) injection, promethazine, sodium chloride flush, sodium chloride flush  Mickeal Skinner, MD [facility]

## 2019-05-20 NOTE — Progress Notes (Addendum)
Family Medicine Teaching Service Daily Progress Note Intern Pager: [phone]  Patient name: [name] Medical record number: [id number] Date of birth: 1976-01-07 Age: 43 y.o. Gender: female  Primary Care Provider: [name], MD Consultants: General Surgery  Code Status: Full  Pt Overview and Major Events to Date:  6/11 Admitted to FPTS, I&D Left Buttocks Abscess  Assessment and Plan: [name] is a 43 y.o. female presenting with left buttock abscess and severely elevated blood glucose. PMH is significant for left BKA, amputation all 5 digits right foot, type 2 diabetes with neuropathy, allergic rhinitis, morbid obesity, GERD, right carpal tunnel syndrome, left hand osteoarthritis, obesity hypoventilation syndrome   Left buttock abscess 3 days S/P I&D and debridement  Abdominal Panus abscess 2 days s/p I&D and debridement Continue Zosyn & clyndamycin. Vanc d/c'd due to renal injury. Plan to c/s ID for alternative antibiotic recommendations to further prevent renal injury. Wound culture abundant GPR and GPC. BCx NG3D -surgery follow, appreciate recs. Vancomycin (6/11-6/13) - f/u wound cultures - f/u blood cultures: - cont zosyn (6/11- ), pending cultures - cont clindamycin (6/12- )  - mIVF @ 120 cc/hr when PICC in place - pain meds: tylenol q4h sch, oxy 5 mg q6h prn, dilaudid 76m q6h prn breakthrough - PT/OT - foley ordered on 6/12 to monitor UOP accurately - ID consulted appreciate recommenations  Nausea/Emesis, improved Improved on current regimen. No QTc Prolongation - Phenergan PRN  AKI, improved Cr mildly improved 3.57 > 3.25.  BL 1, and was 1.2 on admission. Nephrology consulted and recommended Renal U/S, Bladder scan, UCx, U Na, U Cr. UA showed moderate Hg, trace leuks, > 50 WBC, Rare bacteria. UKorealimited due to pt body habitus, but was read as normal w/o signs of pylonephritis or nephrolythiasis. Vancomycin, PPI discontinued. mIVF started. Poor UOP w/ 550 charted  past 24 hours. FeNA 0.3%, likely pre-renal.  - cont 120 cc/hr - monitor Cr closely - monitor UOP - Nephrology consulted, appreciate recommendations.   Hyperglycemia: Improved Hgb A1c 17.5 on 6/11.  Home meds: insulin 70/30 70 units in a.m., 35 units in p.m.  CBGs past 24 hrs 140 - 240.  - Lantus 40u QD - rSSI + QHS coverage - CBGs QAC/HS  Acute Encephalopathy, improved Some concern for intermittent confusion on 6/12.  This AM patient conversing appropriately and A&Ox4.  Likely improving with improving infection. - cont to monitor  Anemia, worse Hg 6.9 in AM. Transfuse 1 pRBC. Transfusion threshold Hg 7.0. Likely due to surgical losses and fluid administration.  - post transfusion H&H - monitor Hg closely -hold DVT PPx until Hgb stable  Morbid obesity BMI 62.  Limited physical activity given amputations. - nutrition consult as outpatient - consider obesity medicine consult as outpatient as well - could also consider adding Victoza to DM regimen for glucose control and weight loss benefit  Bilateral amputations Left BKA, all toes amputated from right foot.  These wound sites are clean dry and intact, well-healed. - no intervention needed  Obesity hypoventilation syndrome Per chart review does not have diagnosis of sleep apnea, but given body habitus likely experiencing symptoms.  Could likely benefit from sleep study as an outpatient. - offer CPAP QHS - outpatient sleep study  Asthma Well-controlled.  Takes Symbicort and albuterol as needed for symptoms.   -Ruthe Mannanwhile inpatient (formulary)  Diabetic neuropathy Takes gabapentin 1200 mg 3 times daily.  Held while NPO and did not reorder after surgery given somnolence.   - can restart  gabapentin on request  Elevated alk phos 291 on admission, this AM pending.  Likely 2/2 inflammation from large gluteal abscess. - could consider GGT or RUQ if suspicion arises for hepatic process  Severe protein calorie  malnutrition Alb 1.7.   FEN/GI: carb modified  PPx: holding given significant Anemia   Disposition: Pending clinical improvement, PT OT recs  Subjective:   Objective: Temp:  [97.5 F (36.4 C)-98 F (36.7 C)] 98 F (36.7 C) (06/14 0453) Pulse Rate:  [82-92] 92 (06/14 0453) Resp:  [18] 18 (06/14 0453) BP: (116-150)/(68-87) 150/87 (06/14 0453) SpO2:  [94 %-98 %] 98 % (06/14 0453) Weight:  [153.8 kg] 153.8 kg (06/14 0539)  Physical Exam:  General: 43 y.o. female in NAD Cardio: RRR no m/r/g Lungs: CTAB, no wheezing, no rhonchi, no crackles, no IWOB on RA Abdomen: Soft, mild TTP LUQ, TTP lower abdomen bilaterally, dressing over pannus abscess and dressing over left buttocks abscess Skin: warm and dry Extremities: left amputation at ankle, right mid foot amputations   Laboratory: Recent Labs  Lab 05/19/19 1238 05/19/19 1821 05/20/19 0350  WBC 16.2* 15.2* 12.9*  HGB 7.4* 7.3* 6.9*  HCT 24.3* 23.6* 22.8*  PLT 397 384 396   Recent Labs  Lab 05/17/19 0640  05/19/19 1151 05/19/19 1821 05/20/19 0350  NA 127*   < > 130* 130* 132*  K 4.5   < > 5.4* 5.2* 4.8  CL 95*   < > 102 103 103  CO2 22   < > 18* 17* 18*  BUN 15   < > 26* 29* 29*  CREATININE 1.01*   < > 3.59* 3.57* 3.25*  CALCIUM 8.6*   < > 7.6* 7.8* 7.7*  PROT 6.7  --   --   --   --   BILITOT 0.2*  --   --   --   --   ALKPHOS 291*  --   --   --   --   ALT 14  --   --   --   --   AST 10*  --   --   --   --   GLUCOSE 588*   < > 278* 201* 152*   < > = values in this interval not displayed.   Imaging/Diagnostic Tests: US Renal  Result Date: 05/19/2019 CLINICAL DATA:  Acute kidney injury EXAM: RENAL / URINARY TRACT ULTRASOUND COMPLETE COMPARISON:  None. FINDINGS: Right Kidney: Renal measurements: 10.1 x 7.4 x 7.3 cm = volume: 282 mL. Examination was very limited secondary to patient body habitus. There is no definite frank hydronephrosis. Left Kidney: Renal measurements: 12.6 x 6.9 x 7.2 cm = volume: 323 mL.  Evaluation was significantly limited by patient body habitus. There is no frank hydronephrosis. Bladder: Not visualized IMPRESSION: 1. Very limited study secondary to patient body habitus. The bladder was not visualized. 2. No frank hydronephrosis. Electronically Signed   By: Constance Holster M.D.   On: 05/19/2019 22:34   Dg Abd 2 Views  Result Date: 05/19/2019 CLINICAL DATA:  Abdominal pain, LEFT buttock abscess EXAM: ABDOMEN - 2 VIEW COMPARISON:  CT abdomen and pelvis 12/10/2016 FINDINGS: Scattered gas within nondistended large and small bowel loops. No bowel wall thickening or free air. Osseous structures unremarkable. IMPRESSION: Nonobstructive bowel gas pattern. Electronically Signed   By: Lavonia Dana M.D.   On: 05/19/2019 14:23    Bonnita Hollow, MD 05/20/2019, 9:43 AM PGY-2, [place] Intern pager: [phone], text pages welcome

## 2019-05-20 NOTE — Progress Notes (Signed)
Critical HGB this morning 6.9.  Called [facility] and reported.

## 2019-05-21 DIAGNOSIS — L0291 Cutaneous abscess, unspecified: Secondary | ICD-10-CM

## 2019-05-21 DIAGNOSIS — E119 Type 2 diabetes mellitus without complications: Secondary | ICD-10-CM

## 2019-05-21 LAB — TYPE AND SCREEN
ABO/RH(D): O POS
Antibody Screen: NEGATIVE
Unit division: 0
Unit division: 0

## 2019-05-21 LAB — BASIC METABOLIC PANEL
Anion gap: 9 (ref 5–15)
BUN: 29 mg/dL — ABNORMAL HIGH (ref 6–20)
CO2: 19 mmol/L — ABNORMAL LOW (ref 22–32)
Calcium: 8 mg/dL — ABNORMAL LOW (ref 8.9–10.3)
Chloride: 109 mmol/L (ref 98–111)
Creatinine, Ser: 2.79 mg/dL — ABNORMAL HIGH (ref 0.44–1.00)
GFR calc Af Amer: 23 mL/min — ABNORMAL LOW (ref >60)
GFR calc non Af Amer: 20 mL/min — ABNORMAL LOW (ref >60)
Glucose, Bld: 167 mg/dL — ABNORMAL HIGH (ref 70–99)
Potassium: 4.8 mmol/L (ref 3.5–5.1)
Sodium: 137 mmol/L (ref 135–145)

## 2019-05-21 LAB — BPAM RBC
Blood Product Expiration Date: 202006182359
Blood Product Expiration Date: 202007132359
ISSUE DATE / TIME: 202006130150
ISSUE DATE / TIME: 202006141342
Unit Type and Rh: 5100
Unit Type and Rh: 5100

## 2019-05-21 LAB — RENAL FUNCTION PANEL
Albumin: 1.2 g/dL — ABNORMAL LOW (ref 3.5–5.0)
Anion gap: 8 (ref 5–15)
BUN: 22 mg/dL — ABNORMAL HIGH (ref 6–20)
CO2: 16 mmol/L — ABNORMAL LOW (ref 22–32)
Calcium: 6.9 mg/dL — ABNORMAL LOW (ref 8.9–10.3)
Chloride: 115 mmol/L — ABNORMAL HIGH (ref 98–111)
Creatinine, Ser: 1.95 mg/dL — ABNORMAL HIGH (ref 0.44–1.00)
GFR calc Af Amer: 36 mL/min — ABNORMAL LOW (ref >60)
GFR calc non Af Amer: 31 mL/min — ABNORMAL LOW (ref >60)
Glucose, Bld: 143 mg/dL — ABNORMAL HIGH (ref 70–99)
Phosphorus: 3.7 mg/dL (ref 2.5–4.6)
Potassium: 3.8 mmol/L (ref 3.5–5.1)
Sodium: 139 mmol/L (ref 135–145)

## 2019-05-21 LAB — GLUCOSE, CAPILLARY
Glucose-Capillary: 160 mg/dL — ABNORMAL HIGH (ref 70–99)
Glucose-Capillary: 167 mg/dL — ABNORMAL HIGH (ref 70–99)
Glucose-Capillary: 185 mg/dL — ABNORMAL HIGH (ref 70–99)
Glucose-Capillary: 194 mg/dL — ABNORMAL HIGH (ref 70–99)

## 2019-05-21 LAB — C DIFFICILE QUICK SCREEN W PCR REFLEX
C Diff antigen: NEGATIVE
C Diff interpretation: NOT DETECTED
C Diff toxin: NEGATIVE

## 2019-05-21 MED ORDER — HYDROMORPHONE HCL 1 MG/ML IJ SOLN
1.0000 mg | INTRAMUSCULAR | Status: DC | PRN
  Administered 2019-05-21 – 2019-05-22 (×7): 1 mg via INTRAVENOUS
  Filled 2019-05-21 (×7): qty 1

## 2019-05-21 MED ORDER — ENSURE MAX PROTEIN PO LIQD
11.0000 [oz_av] | Freq: Two times a day (BID) | ORAL | Status: DC
  Administered 2019-05-22 – 2019-05-23 (×2): 11 [oz_av] via ORAL
  Filled 2019-05-21 (×7): qty 330

## 2019-05-21 MED ORDER — HEPARIN SODIUM (PORCINE) 5000 UNIT/ML IJ SOLN
5000.0000 [IU] | Freq: Three times a day (TID) | INTRAMUSCULAR | Status: DC
  Administered 2019-05-21 – 2019-05-25 (×12): 5000 [IU] via SUBCUTANEOUS
  Filled 2019-05-21 (×13): qty 1

## 2019-05-21 MED ORDER — PRO-STAT SUGAR FREE PO LIQD
30.0000 mL | Freq: Three times a day (TID) | ORAL | Status: DC
  Administered 2019-05-22: 30 mL via ORAL
  Filled 2019-05-21 (×4): qty 30

## 2019-05-21 MED ORDER — ADULT MULTIVITAMIN W/MINERALS CH
1.0000 | ORAL_TABLET | Freq: Every day | ORAL | Status: DC
  Administered 2019-05-23 – 2019-05-25 (×3): 1 via ORAL
  Filled 2019-05-21 (×4): qty 1

## 2019-05-21 NOTE — Progress Notes (Signed)
Subjective: Interval History: has no complaint , glad to be getting better. .  Objective: Vital signs in last 24 hours: Temp:  [97.8 F (36.6 C)-98.7 F (37.1 C)] 97.8 F (36.6 C) (06/14 2201) Pulse Rate:  [86-91] 91 (06/14 2201) Resp:  [16-18] 18 (06/14 2201) BP: (106-137)/(62-99) 137/99 (06/14 2201) SpO2:  [92 %-100 %] 100 % (06/15 0805) Weight:  [155 kg] 155 kg (06/15 0500) Weight change: 1.2 kg  Intake/Output from previous day: 06/14 0701 - 06/15 0700 In: 1499.4 [P.O.:480; I.V.:296.4; Blood:318; IV Piggyback:405] Out: 3950 [Urine:3950] Intake/Output this shift: Total I/O In: 120 [P.O.:120] Out: -   General appearance: alert, cooperative, no distress, morbidly obese and pale Resp: diminished breath sounds bilaterally Cardio: S1, S2 normal and systolic murmur: holosystolic 2/6, blowing at apex GI: obese, pos bs, liver down 6 cm Extremities: edema 1-2+ and L BKA. toe amps on R  Lab Results: Recent Labs    05/20/19 0350 05/20/19 2022  WBC 12.9* 14.4*  HGB 6.9* 7.7*  HCT 22.8* 24.8*  PLT 396 424*   BMET:  Recent Labs    05/20/19 0350 05/21/19 0506  NA 132* 137  K 4.8 4.8  CL 103 109  CO2 18* 19*  GLUCOSE 152* 167*  BUN 29* 29*  CREATININE 3.25* 2.79*  CALCIUM 7.7* 8.0*   No results for input(s): PTH in the last 72 hours. Iron Studies: No results for input(s): IRON, TIBC, TRANSFERRIN, FERRITIN in the last 72 hours.  Studies/Results: US Renal  Result Date: 05/19/2019 CLINICAL DATA:  Acute kidney injury EXAM: RENAL / URINARY TRACT ULTRASOUND COMPLETE COMPARISON:  None. FINDINGS: Right Kidney: Renal measurements: 10.1 x 7.4 x 7.3 cm = volume: 282 mL. Examination was very limited secondary to patient body habitus. There is no definite frank hydronephrosis. Left Kidney: Renal measurements: 12.6 x 6.9 x 7.2 cm = volume: 323 mL. Evaluation was significantly limited by patient body habitus. There is no frank hydronephrosis. Bladder: Not visualized IMPRESSION: 1.  Very limited study secondary to patient body habitus. The bladder was not visualized. 2. No frank hydronephrosis. Electronically Signed   By: Constance Holster M.D.   On: 05/19/2019 22:34   Dg Abd 2 Views  Result Date: 05/19/2019 CLINICAL DATA:  Abdominal pain, LEFT buttock abscess EXAM: ABDOMEN - 2 VIEW COMPARISON:  CT abdomen and pelvis 12/10/2016 FINDINGS: Scattered gas within nondistended large and small bowel loops. No bowel wall thickening or free air. Osseous structures unremarkable. IMPRESSION: Nonobstructive bowel gas pattern. Electronically Signed   By: Lavonia Dana M.D.   On: 05/19/2019 14:23    I have reviewed the patient's current medications.  Assessment/Plan: 1 AKI appears contrast, or drug related. Improving starting to diurese.  Mild acidemia should improve with^GFR 2 anemia per primary 3 Buttock abscess 4 massive obesity 5 DM 6 PVD P hold off ivf, allow to diruese, avoid nephrotoxins.    LOS: 4 days   Jeneen Rinks Cyra Spader 05/21/2019,11:42 AM

## 2019-05-21 NOTE — Progress Notes (Signed)
Physical Therapy Wound Treatment Patient Details  Name: [name] MRN: [id number] Date of Birth: 1976/06/01  Today's Date: 05/21/2019 Time: 1660-6301 Time Calculation (min): 71 min  Subjective  Subjective: Pt in pain but agreeable to hydro Patient and Family Stated Goals: none stated Date of Onset: ("several weeks ago," per pt) Prior Treatments: s/p I&D 6/11, 6/12  Pain Score:  Asleep however arouses easily  Wound Assessment  Wound / Incision (Open or Dehisced) 05/19/19 Buttocks Left (Active)  Dressing Type Gauze (Comment);ABD 05/21/19 1312  Dressing Changed Changed 05/21/19 1312  Dressing Status Clean;Dry;Intact 05/21/19 1312  Dressing Change Frequency Daily 05/21/19 1312  Site / Wound Assessment Granulation tissue;Painful;Pale;Pink;Red;Yellow 05/21/19 1312  % Wound base Red or Granulating 50% 05/21/19 1312  % Wound base Yellow/Fibrinous Exudate 45% 05/21/19 1312  % Wound base Black/Eschar 5% 05/21/19 1312  % Wound base Other/Granulation Tissue (Comment) 0% 05/21/19 1312  Peri-wound Assessment Edema;Pink;Excoriated 05/21/19 1312  Wound Length (cm) 9.5 cm 05/19/19 1600  Wound Width (cm) 6 cm 05/19/19 1600  Wound Depth (cm) 5 cm 05/19/19 1600  Wound Volume (cm^3) 285 cm^3 05/19/19 1600  Wound Surface Area (cm^2) 57 cm^2 05/19/19 1600  Margins Unattached edges (unapproximated) 05/21/19 1312  Drainage Amount Minimal 05/21/19 1312  Drainage Description Serosanguineous 05/21/19 1312  Non-staged Wound Description Partial thickness 05/21/19 0030  Treatment Debridement (Selective);Hydrotherapy (Pulse lavage);Packing (Saline gauze) 05/21/19 1312     Wound / Incision (Open or Dehisced) 05/21/19 Non-pressure wound Abdomen Left (Active)  Dressing Type ABD;Barrier Film (skin prep);Gauze (Comment);Moist to dry 05/21/19 1312  Dressing Changed Changed 05/21/19 1312  Dressing Status Clean;Dry;Intact 05/21/19 1312  Dressing Change Frequency Daily 05/21/19 1312  Site / Wound Assessment  Pink;Yellow;Black 05/21/19 1312  % Wound base Red or Granulating 10% 05/21/19 1312  % Wound base Yellow/Fibrinous Exudate 40% 05/21/19 1312  % Wound base Black/Eschar 50% 05/21/19 1312  Peri-wound Assessment Intact;Erythema (blanchable) 05/21/19 1312  Wound Length (cm) 5 cm 05/21/19 1100  Wound Width (cm) 7 cm 05/21/19 1100  Wound Depth (cm) 3.4 cm 05/21/19 1100  Wound Volume (cm^3) 119 cm^3 05/21/19 1100  Wound Surface Area (cm^2) 35 cm^2 05/21/19 1100  Margins Unattached edges (unapproximated) 05/21/19 1312  Closure None 05/21/19 1312  Drainage Amount Moderate 05/21/19 1312  Drainage Description Purulent 05/21/19 1312  Treatment Debridement (Selective);Hydrotherapy (Pulse lavage);Packing (Saline gauze) 05/21/19 1312      Hydrotherapy Pulsed lavage therapy - wound location: L buttocks, Pannus fold Pulsed Lavage with Suction (psi): 12 psi Pulsed Lavage with Suction - Normal Saline Used: 1000 mL Pulsed Lavage Tip: Tip with splash shield Selective Debridement Selective Debridement - Location: L buttocks, Pannus fold Selective Debridement - Tools Used: Forceps;Scissors Selective Debridement - Tissue Removed: Yellow and black necrotic tissue   Wound Assessment and Plan  Wound Therapy - Assess/Plan/Recommendations Wound Therapy - Clinical Statement: Assessed pt with surgical PA this session and pannus wound added to hydrotherapy this session. Pain was a limiting factor, but treatment was able to be completed. This patient will benefit from continued hydrotherapy for selective removal of unviable tissue, to decrease bioburden and promote wound bed healing.  Wound Therapy - Functional Problem List: decreased mobility Factors Delaying/Impairing Wound Healing: Diabetes Mellitus Hydrotherapy Plan: Debridement;Dressing change;Patient/family education;Pulsatile lavage with suction Wound Therapy - Frequency: 6X / week Wound Therapy - Follow Up Recommendations: Skilled nursing facility Wound  Plan: see above  Wound Therapy Goals- Improve the function of patient's integumentary system by progressing the wound(s) through the phases of wound healing (inflammation - proliferation -  remodeling) by: Decrease Necrotic Tissue to: 30 Decrease Necrotic Tissue - Progress: Progressing toward goal Increase Granulation Tissue to: 70 Increase Granulation Tissue - Progress: Progressing toward goal Goals/treatment plan/discharge plan were made with and agreed upon by patient/family: Yes Time For Goal Achievement: 7 days Wound Therapy - Potential for Goals: Good  Goals will be updated until maximal potential achieved or discharge criteria met.  Discharge criteria: when goals achieved, discharge from hospital, MD decision/surgical intervention, no progress towards goals, refusal/missing three consecutive treatments without notification or medical reason.  GP     [name] 05/21/2019, 1:30 PM   Rolinda Roan, PT, DPT Acute Rehabilitation Services Pager: [phone] Office: [phone]

## 2019-05-21 NOTE — Evaluation (Signed)
Physical Therapy Evaluation Patient Details Name: [name] MRN: [id number] DOB: 09/24/1976 Today's Date: 05/21/2019   History of Present Illness  Belinda Banksis a 43 y.o.femalepresenting with left buttock abscess and severely elevated blood glucose. PMH is significant forleft BKA, amputation all 5 digits right foot, type 2 diabetes with neuropathy, allergic rhinitis, morbid obesity, GERD, right carpal tunnel syndrome, left hand osteoarthritis, obesity hypoventilation syndrome  Clinical Impression  Patient was limited by pain in her buttock and pain from the flexi-seal. She is heasitant to move with the flexi seal in. Therapy advised her that she may have it for a few days and she still needs to try to move. She did well positioning herself using the hospital bed to be abel to get to the edge of the bed. She reported increased pain in her bottom while sitting. She also did not have her prosthetic today. At this time she may benefit the most from rehab at a SNF. If she can progress and transfer with her prosthetic she may be able to the point where she can go home.      Follow Up Recommendations SNF;Home health PT depending on progress.     Equipment Recommendations  None recommended by PT    Recommendations for Other Services Rehab consult     Precautions / Restrictions Precautions Precautions: Fall Restrictions Weight Bearing Restrictions: No      Mobility  Bed Mobility Overal bed mobility: Needs Assistance Bed Mobility: Rolling;Supine to Sit Rolling: Mod assist   Supine to sit: Mod assist     General bed mobility comments: Mod a to sit up at the edge of th ebd. Patient able to use the hospital bed to sit herself up and use the handles. She required mod a at the end to sit ll the way up. She reported pain from the flexi seal. She was put back in a supine position. She rolled back to the left sid e with assist.   Transfers                 General transfer comment:  declined. Patients mother will bring her prothetic   Ambulation/Gait                Stairs            Wheelchair Mobility    Modified Rankin (Stroke Patients Only)       Balance Overall balance assessment: Needs assistance Sitting-balance support: Bilateral upper extremity supported Sitting balance-Leahy Scale: Fair                                       Pertinent Vitals/Pain Pain Assessment: Faces Faces Pain Scale: Hurts whole lot Pain Location: left buttocks/ Flexi seal  Pain Descriptors / Indicators: Constant;Moaning;Guarding;Crying Pain Intervention(s): Limited activity within patient's tolerance;Monitored during session;Repositioned    Home Living Family/patient expects to be discharged to:: Private residence Living Arrangements: Spouse/significant other;Children Available Help at Discharge: Family;Available PRN/intermittently Type of Home: House Home Access: Stairs to enter Entrance Stairs-Rails: None Entrance Stairs-Number of Steps: 2 Home Layout: One level Home Equipment: Walker - 2 wheels Additional Comments: home setup information from pt's chart.     Prior Function Level of Independence: Needs assistance   Gait / Transfers Assistance Needed: was able to pivot with prothetci to wheelchair   ADL's / Homemaking Assistance Needed: daughter assited         Hand Dominance  Extremity/Trunk Assessment        Lower Extremity Assessment Lower Extremity Assessment: Generalized weakness    Cervical / Trunk Assessment Cervical / Trunk Assessment: Normal  Communication   Communication: No difficulties  Cognition Arousal/Alertness: Awake/alert Behavior During Therapy: Agitated Overall Cognitive Status: Within Functional Limits for tasks assessed                                 General Comments: Patient mildly agitated about having to move with flexi seal in place.       General Comments General  comments (skin integrity, edema, etc.): large wound on left buttock     Exercises     Assessment/Plan    PT Assessment Patient needs continued PT services  PT Problem List Decreased strength;Decreased activity tolerance;Decreased balance;Decreased mobility;Decreased knowledge of use of DME;Pain;Obesity       PT Treatment Interventions DME instruction;Gait training;Functional mobility training;Stair training;Therapeutic activities;Therapeutic exercise;Neuromuscular re-education    PT Goals (Current goals can be found in the Care Plan section)  Acute Rehab PT Goals Patient Stated Goal: to have less pain  PT Goal Formulation: With patient Time For Goal Achievement: 05/28/19 Potential to Achieve Goals: Good    Frequency Min 3X/week   Barriers to discharge        Co-evaluation               AM-PAC PT "6 Clicks" Mobility  Outcome Measure Help needed turning from your back to your side while in a flat bed without using bedrails?: A Lot Help needed moving from lying on your back to sitting on the side of a flat bed without using bedrails?: A Lot Help needed moving to and from a bed to a chair (including a wheelchair)?: Total Help needed standing up from a chair using your arms (e.g., wheelchair or bedside chair)?: Total Help needed to walk in hospital room?: Total Help needed climbing 3-5 steps with a railing? : Total 6 Click Score: 8    End of Session Equipment Utilized During Treatment: Gait belt Activity Tolerance: Patient limited by pain Patient left: in bed;with call bell/phone within reach;with bed alarm set Nurse Communication: Mobility status PT Visit Diagnosis: Unsteadiness on feet (R26.81);Muscle weakness (generalized) (M62.81);Other abnormalities of gait and mobility (R26.89)    Time: 1410-1438 PT Time Calculation (min) (ACUTE ONLY): 28 min   Charges:   PT Evaluation $PT Eval Moderate Complexity: 1 Mod            Carney Living PT DPT  05/21/2019,  4:56 PM

## 2019-05-21 NOTE — Progress Notes (Signed)
Initial Nutrition Assessment  RD working remotely.  DOCUMENTATION CODES:   Morbid obesity  INTERVENTION:   -Ensure Max po BID, each supplement provides 150 kcal and 30 grams of protein -30 ml Prostat TID, each supplement provides 100 kcals and 15 grams protein -MVI with minerals daily  NUTRITION DIAGNOSIS:   Increased nutrient needs related to wound healing, post-op healing as evidenced by estimated needs.  GOAL:   Patient will meet greater than or equal to 90% of their needs  MONITOR:   PO intake, Supplement acceptance, Labs, Weight trends, Skin, I & O's  REASON FOR ASSESSMENT:   Consult Assessment of nutrition requirement/status  ASSESSMENT:   [name] is a 43 y.o. female presenting with right buttock abscess and severely elevated blood glucose. PMH is significant for left BKA, amputation all 5 digits right foot, type 2 diabetes with neuropathy, allergic rhinitis, morbid obesity, GERD, right carpal tunnel syndrome, left hand osteoarthritis, obesity hypoventilation  Pt admitted with lt gluteal infection and possible abscess.   6/11- s/p I&D of lt buttock abscess 6/12- s/p I&D of lt lower abdominal pannus abscess 6/13- PICC placed, hydrotherapy initiated  Reviewed I/O's: -2.5 L x 24 hours and +5.6 L since admission  UOP: 4 L x 24 hours  Per MD notes, wound cultures revealed GPC and GPR.   Attempted to speak with pt via phone, however, unable to reach. Unable to obtain further nutrition-related history at this time. Per general surgery notes, pt remains with diarrhea and will place rectal tube after dressing change today.  Per chart review, pt with poor oral intake secondary to nausea. Documented meal completion 0-25%.   Reviewed wt hx; noted progressive wt gain over the past year.   Lab Results  Component Value Date   HGBA1C 17.5 (H) 05/17/2019   PTA DM medications are 70 units humulin 70/30 daily at breakfast and 35 units daily at supper.   Labs reviewed:  CBGS: 109-160 (inpatient orders for glycemic control are 0-20 units insulin aspart TID with meals, 0-5 units insulin aspart q HS, 40 units insulin glargine daily).   NUTRITION - FOCUSED PHYSICAL EXAM:    Most Recent Value  Orbital Region  Unable to assess  Upper Arm Region  Unable to assess  Thoracic and Lumbar Region  Unable to assess  Buccal Region  Unable to assess  Temple Region  Unable to assess  Clavicle Bone Region  Unable to assess  Clavicle and Acromion Bone Region  Unable to assess  Scapular Bone Region  Unable to assess  Dorsal Hand  Unable to assess  Patellar Region  Unable to assess  Anterior Thigh Region  Unable to assess  Posterior Calf Region  Unable to assess  Edema (RD Assessment)  Unable to assess  Hair  Unable to assess  Eyes  Unable to assess  Mouth  Unable to assess  Skin  Unable to assess  Nails  Unable to assess       Diet Order:   Diet Order            Diet Carb Modified Fluid consistency: Thin; Room service appropriate? No  Diet effective now              EDUCATION NEEDS:   No education needs have been identified at this time  Skin:  Skin Assessment: Skin Integrity Issues: Skin Integrity Issues:: Incisions, Other (Comment) Incisions: abdomen, buttocks Other: partial thickness wound on lt buttocks with early granulation tissue  Last BM:  05/21/19  Height:  Ht Readings from Last 1 Encounters:  05/18/19 5\' 2"  (1.575 m)    Weight:   Wt Readings from Last 1 Encounters:  05/21/19 (!) 155 kg    Ideal Body Weight:  46.8 kg(adjusted for lt BKA)  BMI:  Body mass index is 62.5 kg/m.  Estimated Nutritional Needs:   Kcal:  1650-1850  Protein:  100-115 grams  Fluid:  > 1.6 L    Deandre Brannan A. Jimmye Norman, RD, LDN, [place] Registered Dietitian II Certified Diabetes Care and Education Specialist Pager: [phone] After hours Pager: [phone]

## 2019-05-21 NOTE — Progress Notes (Addendum)
Approx 2050 RN to patient bedside complaining of burning on buttocks. Assessed site, no stool leakage observed, drainage from wound observed. Offered pain meds.   Patient stated that she did not receive her tray. Secretary followed up with dietary; informed mix-up in system and patient should have received tray automatically. Meal tray settings change to be sent automatically and only staff remove tray from room; not dietary. Informed patient of updated tray settings and offered TV dinner or snacks. Patient declined TV dinner and unsure of snack desire. Informed patient to think about desired snack so RN can provide upon return.   When administering pain meds at 2121, patient complaining of pain and need to remove flexiseal. Informed patient that will speak with another nurse but unsure if seal can be removed at this time given wound healing. Provided variety of snacks applesauce, jello, graham crackers and saltine crackers.   Spoke with DO Meccariello at 2206 and informed of patient's discomfort with flexiseal. Indicated flexiseal could be removed if patient uncomfortable.   Spoke with patient at [place] about CT scan. Patient inquired about purpose of CT scan; informed patient based on DO note scan to further assess abscess. Explained need to have CT scan within 2 hours of starting first bottle of contrast and CT currently scheduled for 0200. Patent stated "I dont know" about going at 0200. Informed patient alternative option would be to drink contrast at 0600 and go at 0800; patient affirmed 0600 better option. Spoke with CT at [place] and informed of plan to do CT at 0800.  Approx 0310 patient called desk asking for assistance and did not indicate why. RN arrived to patient's room, patient stated she had not been repositioned nor had flexiseal removed. Informed patient previously assisted with repositioning and again assisted with repositioning. Patient stated in pain, offered pain meds. Patient then stated  not given food, informed snacks are at bedside, tray on auto order in AM and reminded she previously declined tv dinner.   During 0321 oxycodone med administration informed patient to receive oxy now, 1 hour later to receive Dilaudid, then remove flexiseal and change dressing. Assisted patient with turning in bed. Tele lead disconnected. Attempted to adjust tele lead, patient frustrated and declined repositioning to return lead. RN reiterated plan to give dilaudid,tylenol, change dressing and remove flexi in one hour. Tele informed at 0345 lead not able to be reapplied.   Charge RN spoke with DO about removing flexiseal and requested DO to bedside to discuss removal. DO to bedside approx 0425; patient asleep. DO requested to be notified to follow up.  Notified by NT patient requesting pain and nausea medications. Pain medications given. Inquired if patient able to drink oral contrast for CT. Patient nauseous therefore not able to drink. Paged DO at [phone]. Spoke via phone and requested come to patient's bedside to discuss removing flexiseal and about CT; DO indicated ok to forego CT and contrast and will follow up about CT with day team. Notified CT at 0534.   Dressing change on abdomen and buttocks completed at 0630. CHG bath and foley care also complete.

## 2019-05-21 NOTE — Progress Notes (Signed)
Patient places self on/off CPAP without assistance. Patient stated she would put CPAP on when she is ready for bed. RT informed patient to have RT called if assistance is needed. RT will monitor as needed.

## 2019-05-21 NOTE — Progress Notes (Addendum)
3 Days Post-Op    CC: Buttocks abscess  Subjective: 2 large wounds, left buttocks wound is 10 x 8 x 4.  Currently is covered in stool.  They plan to replace the Flexi-Seal after the wound change.  I was not able to measure the abdominal wound under the pannus but it is about 6 x 6 x 4 cm.  It has some fat necrosis at the base.  But is fairly clean.  The skin around it is wet with drainage and may have some early Candida from being moist. Objective: Vital signs in last 24 hours: Temp:  [97.8 F (36.6 C)-98.7 F (37.1 C)] 97.8 F (36.6 C) (06/14 2201) Pulse Rate:  [86-91] 91 (06/14 2201) Resp:  [16-18] 18 (06/14 2201) BP: (106-137)/(62-99) 137/99 (06/14 2201) SpO2:  [92 %-100 %] 100 % (06/15 0805) Weight:  [155 kg] 155 kg (06/15 0500) Last BM Date: 05/21/19 480 p.o. recorded 318 blood -packed RBCs 700 IV Urine 3950 BM x3 Afebrile last blood pressure at 1900 hrs. last p.m. was slightly elevated. Creatinine 2.79 today WBC 14.4 yesterday Specimen Description BUTTOCKS   Special Requests NONE   Gram Stain MODERATE WBC PRESENT, PREDOMINANTLY PMN  ABUNDANT GRAM POSITIVE COCCI  ABUNDANT GRAM POSITIVE RODS  Performed at [facility], [place] [address], [place], [place] [address]   Culture FEW NORMAL SKIN FLORA  NO ANAEROBES ISOLATED; CULTURE IN PROGRESS FOR 5 DAYS   Urine culture no growth Blood cultures no growth x3 days  Intake/Output from previous day: 06/14 0701 - 06/15 0700 In: 1499.4 [P.O.:480; I.V.:296.4; Blood:318; IV Piggyback:405] Out: 3950 [Urine:3950] Intake/Output this shift: Total I/O In: 120 [P.O.:120] Out: -   General appearance: alert, cooperative and [name] is just had oxycodone and Dilaudid. Skin: The buttocks wound is as described above.  There is also some erythema and induration around the wound.  The abdominal wound under the pannus is difficult to get to.  It is wet and draining a good deal.  Early skin breakdown secondary to the  moisture.  Lab Results:  Recent Labs    05/20/19 0350 05/20/19 2022  WBC 12.9* 14.4*  HGB 6.9* 7.7*  HCT 22.8* 24.8*  PLT 396 424*    BMET Recent Labs    05/20/19 0350 05/21/19 0506  NA 132* 137  K 4.8 4.8  CL 103 109  CO2 18* 19*  GLUCOSE 152* 167*  BUN 29* 29*  CREATININE 3.25* 2.79*  CALCIUM 7.7* 8.0*   PT/INR No results for input(s): LABPROT, INR in the last 72 hours.  Recent Labs  Lab 05/17/19 0640  AST 10*  ALT 14  ALKPHOS 291*  BILITOT 0.2*  PROT 6.7  ALBUMIN 1.7*     Lipase     Component Value Date/Time   LIPASE 24 05/08/2019 1927     Medications: . acetaminophen  650 mg Oral Q4H  . atorvastatin  20 mg Oral Daily  . Chlorhexidine Gluconate Cloth  6 each Topical Q0600  . insulin aspart  0-20 Units Subcutaneous TID WC  . insulin aspart  0-5 Units Subcutaneous QHS  . insulin glargine  40 Units Subcutaneous Daily  . mometasone-formoterol  2 puff Inhalation BID  . sodium chloride flush  10-40 mL Intracatheter Q12H   . sodium chloride 150 mL/hr at 05/19/19 2158  . sodium chloride 50 mL/hr at 05/21/19 0209  .  ceFAZolin (ANCEF) IV 2 g (05/21/19 0515)  . doxycycline (VIBRAMYCIN) IV 100 mg (05/21/19 0205)  . lactated  ringers 10 mL/hr at 05/18/19 1347  . sodium chloride     Anti-infectives (From admission, onward)   Start     Dose/Rate Route Frequency Ordered Stop   05/20/19 2200  ceFAZolin (ANCEF) IVPB 2g/100 mL premix     2 g 200 mL/hr over 30 Minutes Intravenous Every 8 hours 05/20/19 1403     05/20/19 1500  doxycycline (VIBRAMYCIN) 100 mg in sodium chloride 0.9 % 250 mL IVPB     100 mg 125 mL/hr over 120 Minutes Intravenous Every 12 hours 05/20/19 1357     05/19/19 1800  piperacillin-tazobactam (ZOSYN) IVPB 3.375 g  Status:  Discontinued     3.375 g 12.5 mL/hr over 240 Minutes Intravenous Every 8 hours 05/19/19 1252 05/20/19 1357   05/19/19 0015  piperacillin-tazobactam (ZOSYN) IVPB 2.25 g  Status:  Discontinued     2.25 g 100 mL/hr over  30 Minutes Intravenous Every 6 hours 05/19/19 0012 05/19/19 1252   05/18/19 2106  vancomycin variable dose per unstable renal function (pharmacist dosing)  Status:  Discontinued      Does not apply See admin instructions 05/18/19 2106 05/19/19 1618   05/18/19 1100  clindamycin (CLEOCIN) IVPB 600 mg  Status:  Discontinued     600 mg 100 mL/hr over 30 Minutes Intravenous Every 8 hours 05/18/19 1022 05/20/19 1357   05/18/19 0900  vancomycin (VANCOCIN) 1,750 mg in sodium chloride 0.9 % 500 mL IVPB  Status:  Discontinued     1,750 mg 250 mL/hr over 120 Minutes Intravenous Every 24 hours 05/17/19 0759 05/18/19 2106   05/17/19 1630  piperacillin-tazobactam (ZOSYN) IVPB 3.375 g  Status:  Discontinued     3.375 g 12.5 mL/hr over 240 Minutes Intravenous Every 8 hours 05/17/19 1609 05/19/19 0011   05/17/19 0730  vancomycin (VANCOCIN) 2,000 mg in sodium chloride 0.9 % 500 mL IVPB     2,000 mg 250 mL/hr over 120 Minutes Intravenous  Once 05/17/19 0729 05/17/19 1130     Assessment/Plan Acute renal failure Morbid obesity BMI 62.5 Insulin-dependent diabetes -poor control Left BKA/right trans-met amputation/diabetic neuropathy Obesity hypoventilation syndrome Anemia Severe protein calorie malnutrition COVID negative  Abscess pannus left lower abdominal wall; left gluteal abscess  1.  I&D of left buttocks abscess involving skin and subcutaneous tissue and muscle(15 x 10 x 5) 05/17/2019 Dr. Donnie Hall. 2.  Incision and drainage of left lower abdominal pannus abscess(skin and subcutaneous tissue 6 x 3 x 2 cm) further debridement of left buttocks abscess(skin and subcutaneous tissue 10 x 4 x 4 cm) Dr. Donnie Hall, 05/18/2019   FEN: IV fluids/carb modified diet ID: Clindamycin 6/12 - 05/20/19; Zosyn 6/13 - 05/20/19;  doxycycline 6/11 >> day 2 Follow-up: To be determined POC Hall,Belinda Spouse   [phone]  Belinda, Hall Mother   [phone]   DVT: None -she can have chemical DVT prophylaxis from  our standpoint  Plan: Hydrotherapy to both the buttocks and the abdominal pannus site.  After this hydrotherapy they are in place a new Flexi-Seal.  Continue wet-to-dry dressings.  We will put some Aquasol in the open abdominal wound and then put some inter-dry between the pannus and abdominal wall.  If it does look better tomorrow we may add some nystatin cream to the skin under the pannus.    LOS: 4 days    Wash Nienhaus 05/21/2019 [phone]

## 2019-05-21 NOTE — Progress Notes (Signed)
[facility] for Infectious Disease   Reason for visit: Follow up on abscess  Interval History: going to start hydrotherapy, no new culture growth. Remains afebrile. No associated rash or diarrhea.    Physical Exam: Constitutional:  Vitals:   05/21/19 0805 05/21/19 1249  BP:  (!) 144/72  Pulse:  89  Resp:  20  Temp:  98.4 F (36.9 C)  SpO2: 100% 95%   patient appears in NAD Respiratory: Normal respiratory effort; CTA B Cardiovascular: RRR GI: soft, nt, nd  Review of Systems: Constitutional: negative for fevers, chills and anorexia Gastrointestinal: negative for nausea and diarrhea Integument/breast: negative for rash  Lab Results  Component Value Date   WBC 14.4 (H) 05/20/2019   HGB 7.7 (L) 05/20/2019   HCT 24.8 (L) 05/20/2019   MCV 93.9 05/20/2019   PLT 424 (H) 05/20/2019    Lab Results  Component Value Date   CREATININE 2.79 (H) 05/21/2019   BUN 29 (H) 05/21/2019   NA 137 05/21/2019   K 4.8 05/21/2019   CL 109 05/21/2019   CO2 19 (L) 05/21/2019    Lab Results  Component Value Date   ALT 14 05/17/2019   AST 10 (L) 05/17/2019   ALKPHOS 291 (H) 05/17/2019     Microbiology: Recent Results (from the past 240 hour(s))  Novel Coronavirus, NAA ([facility])     Status: None   Collection Time: 05/11/19  2:35 PM  Result Value Ref Range Status   SARS-CoV-2, NAA Not Detected Not Detected Final    Comment: This test was developed and its performance characteristics determined by [employer]. This test has not been FDA cleared or approved. This test has been authorized by FDA under an Emergency Use Authorization (EUA). This test is only authorized for the duration of time the declaration that circumstances exist justifying the authorization of the emergency use of in vitro diagnostic tests for detection of SARS-CoV-2 virus and/or diagnosis of COVID-19 infection under section 564(b)(1) of the Act, 21 U.S.C. 177LTJ-0(Z)(0), unless the authorization is  terminated or revoked sooner. When diagnostic testing is negative, the possibility of a false negative result should be considered in the context of a patient's recent exposures and the presence of clinical signs and symptoms consistent with COVID-19. An individual without symptoms of COVID-19 and who is not shedding SARS-CoV-2 virus would expect to have a negative (not detected) result in this assay.   Blood culture (routine x 2)     Status: None (Preliminary result)   Collection Time: 05/17/19  7:21 AM   Specimen: BLOOD  Result Value Ref Range Status   Specimen Description BLOOD BLOOD RIGHT FOREARM  Final   Special Requests   Final    BOTTLES DRAWN AEROBIC AND ANAEROBIC Blood Culture adequate volume   Culture  Setup Time PENDING  Incomplete   Culture   Final    NO GROWTH 3 DAYS Performed at [facility], [address], [place], [place] [address]    Report Status PENDING  Incomplete  Blood culture (routine x 2)     Status: None (Preliminary result)   Collection Time: 05/17/19  7:26 AM   Specimen: BLOOD  Result Value Ref Range Status   Specimen Description BLOOD RIGHT ANTECUBITAL  Final   Special Requests   Final    BOTTLES DRAWN AEROBIC AND ANAEROBIC Blood Culture results may not be optimal due to an excessive volume of blood received in culture bottles   Culture   Final  NO GROWTH 3 DAYS Performed at [facility], [place] [address] [place], [place] [address]    Report Status PENDING  Incomplete  SARS Coronavirus 2     Status: None   Collection Time: 05/17/19  8:46 AM  Result Value Ref Range Status   SARS Coronavirus 2 NOT DETECTED NOT DETECTED Final    Comment: (NOTE) SARS-CoV-2 target nucleic acids are NOT DETECTED. The SARS-CoV-2 RNA is generally detectable in upper and lower respiratory specimens during the acute phase of infection.  Negative  results do not preclude SARS-CoV-2 infection, do not rule out co-infections with other pathogens, and should not be  used as the sole basis for treatment or other patient management decisions.  Negative results must be combined with clinical observations, patient history, and epidemiological information. The expected result is Not Detected. Fact Sheet for Patients: http://www.biofiredefense.com/wp-content/uploads/2020/03/BIOFIRE-COVID -19-patients.pdf Fact Sheet for Healthcare Providers: http://www.biofiredefense.com/wp-content/uploads/2020/03/BIOFIRE-COVID -19-hcp.pdf This test is not yet approved or cleared by the [place] and  has been authorized for detection and/or diagnosis of SARS-CoV-2 by FDA under an Emergency Use Authorization (EUA).  This EUA will remain in effec t (meaning this test can be used) for the duration of  the COVID-19 declaration under Section 564(b)(1) of the Act, 21 U.S.C. section 360bbb-3(b)(1), unless the authorization is terminated or revoked sooner. Performed at [facility], [place] [address] [place], [place] [address]   Aerobic/Anaerobic Culture (surgical/deep wound)     Status: None (Preliminary result)   Collection Time: 05/17/19  1:55 PM   Specimen: Buttocks; Abscess  Result Value Ref Range Status   Specimen Description BUTTOCKS  Final   Special Requests NONE  Final   Gram Stain   Final    MODERATE WBC PRESENT, PREDOMINANTLY PMN ABUNDANT GRAM POSITIVE COCCI ABUNDANT GRAM POSITIVE RODS Performed at [facility], [place] [address] [place], [place] [address]    Culture   Final    FEW NORMAL SKIN FLORA NO ANAEROBES ISOLATED; CULTURE IN PROGRESS FOR 5 DAYS    Report Status PENDING  Incomplete  Culture, Urine     Status: None   Collection Time: 05/19/19  4:26 PM   Specimen: Urine, Catheterized  Result Value Ref Range Status   Specimen Description URINE, CATHETERIZED  Final   Special Requests NONE  Final   Culture   Final    NO GROWTH Performed at [facility], [place] [address], [place], [place] [address]    Report Status 05/20/2019  FINAL  Final    Impression/Plan:  1. Abscess - on doxy + cefazolin.  No further surgery planned.  To start hydrotherapy.  I would treat through June 18th and stop.  Can use oral doxy + keflex if discharged.    2.  DM - needs much improved management to reduce further infectious risk.    Thanks for consultation, call with any questions.

## 2019-05-21 NOTE — Significant Event (Signed)
For 6/15 1900 to 6/16 at 0700, please page [phone].  Pager [phone] is not working.  [place] Constable, D.O.  PGY-1 Family Medicine  05/21/2019 10:05 PM

## 2019-05-21 NOTE — Progress Notes (Signed)
Family Medicine Teaching Service Daily Progress Note Intern Pager: [phone]  Patient name: [name] Medical record number: [id number] Date of birth: Dec 19, 1975 Age: 43 y.o. Gender: female  Primary Care Provider: [name], MD Consultants: General Surgery  Code Status: Full  Pt Overview and Major Events to Date:  6/11 Admitted to FPTS, I&D Left Buttocks Abscess  Assessment and Plan: Lynora Dymond is a 43 y.o. female presenting with left buttock abscess and severely elevated blood glucose. PMH is significant for left BKA, amputation all 5 digits right foot, type 2 diabetes with neuropathy, allergic rhinitis, morbid obesity, GERD, right carpal tunnel syndrome, left hand osteoarthritis, obesity hypoventilation syndrome   Left buttock abscess 3 days S/P I&D and debridement  Abdominal Panus abscess 2 days s/p I&D and debridement Wound culture abundant GPR and GPC. BCx NG3D - surgery following, appreciate recs - ID consulted appreciate recommendations - f/u CT abdomen for further assessment of abscess - f/u wound cultures - f/u blood cultures: - s/p zosyn (6/11- 6/14) - s/p clindamycin (6/12- 6/14)  - s/p vanc (6/11 - 6/12) - cefazolin (6/14- ), per ID - doxycycline (6/14- ), per ID - pain meds: tylenol q4h sch, oxy 5 mg q6h prn, dilaudid 20m q6h prn breakthrough - foley ordered on 6/12 to monitor UOP accurately - fecal management system ordered - OT SNF - PT ?  Nausea/Emesis, improved Improved on current regimen. No QTc Prolongation - Phenergan PRN  AKI, improved AKI likely secondary to antibiotic use.  Improvement today.  Creatinine 3.25>2.79(6/15) - cont 120 cc/hr - monitor Cr closely - monitor UOP - Nephrology consulted, appreciate recommendations.   Hyperglycemia: Improved Hgb A1c 17.5 on 6/11.  Home meds: insulin 70/30. Blood glucose 109-167 past 24 hours.  Lantus 40 given in addition to 9 units aspart in the past 24 hours.  - Lantus 40u QD - rSSI + QHS  coverage - CBGs QAC/HS  Acute Encephalopathy, improved Mentating well on exam today. - cont to monitor  Anemia, worse Hgb 7.7 improved from 6.9 following 1 unit packed RBCs. - monitor Hg closely - hold DVT PPx until Hgb stable  Morbid obesity BMI 62.  Limited physical activity given amputations. - nutrition consult as outpatient - consider obesity medicine consult as outpatient as well - could also consider adding Victoza to DM regimen for glucose control and weight loss benefit  Bilateral amputations Left BKA, all toes amputated from right foot.  These wound sites are clean dry and intact, well-healed. - no intervention needed  Obesity hypoventilation syndrome - offer CPAP QHS - outpatient sleep study  Asthma Well-controlled.  Takes Symbicort and albuterol as needed for symptoms.   -Ruthe Mannanwhile inpatient (formulary)  Diabetic neuropathy Takes gabapentin 1200 mg 3 times daily.  Held while NPO and did not reorder after surgery given somnolence.   - can restart gabapentin on request  Elevated alk phos 291 on admission, this AM pending.  Likely 2/2 inflammation from large gluteal abscess. - could consider GGT or RUQ if suspicion arises for hepatic process  Severe protein calorie malnutrition Alb 1.7.   FEN/GI: carb modified  PPx: holding given significant Anemia   Disposition: 2-3 additional days of hospitalization anticipated before discharge.  Subjective:  No acute events overnight.  Patient was seen on nursing staff was cleaning her last bowel movement.  She appeared to be in significant pain noted significant pain with bowel movements affecting her gluteal incisions.  She reported that she has been having multiple soft stools per  day.  She is not reporting any abdominal pain at this time.  Objective: Temp:  [97.8 F (36.6 C)-98.7 F (37.1 C)] 97.8 F (36.6 C) (06/14 2201) Pulse Rate:  [86-91] 91 (06/14 2201) Resp:  [16-18] 18 (06/14 2201) BP:  (106-137)/(62-99) 137/99 (06/14 2201) SpO2:  [92 %-100 %] 100 % (06/14 2201) Weight:  [155 kg] 155 kg (06/15 0500)  Physical Exam:  General: Assessed today during her bath following a bowel movement.  She was in significant distress as the bowel movement caused significant pain to her gluteal lesions. HEENT: Neck non-tender without lymphadenopathy, masses or thyromegaly Cardio: Normal S1 and S2, no S3 or S4. Rhythm is regular. No murmurs or rubs.   Pulm: Clear to auscultation bilaterally, no crackles, wheezing, or diminished breath sounds. Normal respiratory effort Abdomen: Bowel sounds normal. Abdomen soft and non-tender.  Extremities: No peripheral edema. Warm/ well perfused.  Strong radial pulse. Neuro: Cranial nerves grossly intact    Laboratory: Recent Labs  Lab 05/19/19 1821 05/20/19 0350 05/20/19 2022  WBC 15.2* 12.9* 14.4*  HGB 7.3* 6.9* 7.7*  HCT 23.6* 22.8* 24.8*  PLT 384 396 424*   Recent Labs  Lab 05/17/19 0640  05/19/19 1821 05/20/19 0350 05/21/19 0506  NA 127*   < > 130* 132* 137  K 4.5   < > 5.2* 4.8 4.8  CL 95*   < > 103 103 109  CO2 22   < > 17* 18* 19*  BUN 15   < > 29* 29* 29*  CREATININE 1.01*   < > 3.57* 3.25* 2.79*  CALCIUM 8.6*   < > 7.8* 7.7* 8.0*  PROT 6.7  --   --   --   --   BILITOT 0.2*  --   --   --   --   ALKPHOS 291*  --   --   --   --   ALT 14  --   --   --   --   AST 10*  --   --   --   --   GLUCOSE 588*   < > 201* 152* 167*   < > = values in this interval not displayed.   Imaging/Diagnostic Tests: No results found.  [name], MD 05/21/2019, 6:29 AM PGY-1, [place] Intern pager: [phone], text pages welcome

## 2019-05-22 ENCOUNTER — Ambulatory Visit: Payer: Medicaid Other | Admitting: Physical Therapy

## 2019-05-22 LAB — BASIC METABOLIC PANEL
Anion gap: 7 (ref 5–15)
BUN: 18 mg/dL (ref 6–20)
CO2: 22 mmol/L (ref 22–32)
Calcium: 8.5 mg/dL — ABNORMAL LOW (ref 8.9–10.3)
Chloride: 112 mmol/L — ABNORMAL HIGH (ref 98–111)
Creatinine, Ser: 1.5 mg/dL — ABNORMAL HIGH (ref 0.44–1.00)
GFR calc Af Amer: 50 mL/min — ABNORMAL LOW (ref >60)
GFR calc non Af Amer: 43 mL/min — ABNORMAL LOW (ref >60)
Glucose, Bld: 141 mg/dL — ABNORMAL HIGH (ref 70–99)
Potassium: 4.2 mmol/L (ref 3.5–5.1)
Sodium: 141 mmol/L (ref 135–145)

## 2019-05-22 LAB — CULTURE, BLOOD (ROUTINE X 2): Culture: NO GROWTH

## 2019-05-22 LAB — CBC
HCT: 25.7 % — ABNORMAL LOW (ref 36.0–46.0)
Hemoglobin: 8.1 g/dL — ABNORMAL LOW (ref 12.0–15.0)
MCH: 29.3 pg (ref 26.0–34.0)
MCHC: 31.5 g/dL (ref 30.0–36.0)
MCV: 93.1 fL (ref 80.0–100.0)
Platelets: 510 10*3/uL — ABNORMAL HIGH (ref 150–400)
RBC: 2.76 MIL/uL — ABNORMAL LOW (ref 3.87–5.11)
RDW: 14.9 % (ref 11.5–15.5)
WBC: 12 10*3/uL — ABNORMAL HIGH (ref 4.0–10.5)
nRBC: 0 % (ref 0.0–0.2)

## 2019-05-22 LAB — AEROBIC/ANAEROBIC CULTURE W GRAM STAIN (SURGICAL/DEEP WOUND): Culture: NORMAL

## 2019-05-22 LAB — GLUCOSE, CAPILLARY
Glucose-Capillary: 153 mg/dL — ABNORMAL HIGH (ref 70–99)
Glucose-Capillary: 140 mg/dL — ABNORMAL HIGH (ref 70–99)
Glucose-Capillary: 109 mg/dL — ABNORMAL HIGH (ref 70–99)
Glucose-Capillary: 120 mg/dL — ABNORMAL HIGH (ref 70–99)

## 2019-05-22 MED ORDER — ALTEPLASE 2 MG IJ SOLR
2.0000 mg | Freq: Once | INTRAMUSCULAR | Status: AC
  Administered 2019-05-22: 2 mg
  Filled 2019-05-22: qty 2

## 2019-05-22 MED ORDER — CEPHALEXIN 500 MG PO CAPS
500.0000 mg | ORAL_CAPSULE | Freq: Four times a day (QID) | ORAL | Status: AC
  Administered 2019-05-22 – 2019-05-25 (×11): 500 mg via ORAL
  Filled 2019-05-22 (×11): qty 1

## 2019-05-22 MED ORDER — OXYCODONE HCL 5 MG PO TABS
10.0000 mg | ORAL_TABLET | ORAL | Status: DC | PRN
  Administered 2019-05-22 – 2019-05-25 (×12): 10 mg via ORAL
  Filled 2019-05-22 (×12): qty 2

## 2019-05-22 MED ORDER — SODIUM BICARBONATE 650 MG PO TABS
1300.0000 mg | ORAL_TABLET | Freq: Two times a day (BID) | ORAL | Status: DC
  Administered 2019-05-22 – 2019-05-25 (×7): 1300 mg via ORAL
  Filled 2019-05-22 (×7): qty 2

## 2019-05-22 MED ORDER — DOXYCYCLINE HYCLATE 100 MG PO TABS
100.0000 mg | ORAL_TABLET | Freq: Two times a day (BID) | ORAL | Status: AC
  Administered 2019-05-22 – 2019-05-24 (×5): 100 mg via ORAL
  Filled 2019-05-22 (×5): qty 1

## 2019-05-22 NOTE — Progress Notes (Signed)
Physical Therapy Wound Treatment Patient Details  Name: [name] MRN: [id number] Date of Birth: 28-Jan-1976  Today's Date: 05/22/2019 Time: 1120-1221 Time Calculation (min): 61 min  Subjective  Subjective: pt agreeable to hydro Patient and Family Stated Goals: none stated Date of Onset: ("several weeks ago," per pt) Prior Treatments: s/p I&D 6/11, 6/12  Pain Score: pain 6/10, premedicated   Wound Assessment  Wound / Incision (Open or Dehisced) 05/19/19 Buttocks Left (Active)  Dressing Type Gauze (Comment);ABD 05/22/19 1348  Dressing Changed Changed 05/21/19 1312  Dressing Status Clean;Dry;Intact 05/22/19 1348  Dressing Change Frequency Daily 05/22/19 1348  Site / Wound Assessment Granulation tissue;Painful;Pale;Pink;Red;Yellow 05/22/19 1348  % Wound base Red or Granulating 50% 05/22/19 1348  % Wound base Yellow/Fibrinous Exudate 45% 05/22/19 1348  % Wound base Black/Eschar 5% 05/22/19 1348  % Wound base Other/Granulation Tissue (Comment) 0% 05/22/19 1348  Peri-wound Assessment Edema;Pink;Excoriated 05/22/19 1348  Wound Length (cm) 9.5 cm 05/19/19 1600  Wound Width (cm) 6 cm 05/19/19 1600  Wound Depth (cm) 5 cm 05/19/19 1600  Wound Volume (cm^3) 285 cm^3 05/19/19 1600  Wound Surface Area (cm^2) 57 cm^2 05/19/19 1600  Margins Unattached edges (unapproximated) 05/22/19 1348  Drainage Amount Minimal 05/22/19 1348  Drainage Description Serosanguineous 05/22/19 1348  Non-staged Wound Description Partial thickness 05/21/19 0030  Treatment Cleansed;Debridement (Selective);Hydrotherapy (Pulse lavage);Packing (Saline gauze) 05/22/19 1348     Wound / Incision (Open or Dehisced) 05/21/19 Non-pressure wound Abdomen Left (Active)  Dressing Type ABD;Barrier Film (skin prep);Gauze (Comment);Moist to dry 05/22/19 1348  Dressing Changed Changed 05/21/19 1312  Dressing Status Clean;Dry;Intact 05/22/19 1348  Dressing Change Frequency Daily 05/22/19 1348  Site / Wound Assessment  Pink;Yellow;Black 05/22/19 1348  % Wound base Red or Granulating 10% 05/22/19 1348  % Wound base Yellow/Fibrinous Exudate 80% 05/22/19 1348  % Wound base Black/Eschar 10% 05/22/19 1348  Peri-wound Assessment Intact;Erythema (blanchable) 05/22/19 1348  Wound Length (cm) 5 cm 05/21/19 1100  Wound Width (cm) 7 cm 05/21/19 1100  Wound Depth (cm) 3.4 cm 05/21/19 1100  Wound Volume (cm^3) 119 cm^3 05/21/19 1100  Wound Surface Area (cm^2) 35 cm^2 05/21/19 1100  Margins Unattached edges (unapproximated) 05/22/19 1348  Closure None 05/22/19 1348  Drainage Amount Minimal 05/22/19 1348  Drainage Description Serosanguineous 05/22/19 1348  Treatment Cleansed;Debridement (Selective);Hydrotherapy (Pulse lavage);Packing (Saline gauze) 05/22/19 1348      Hydrotherapy Pulsed lavage therapy - wound location: L buttocks, Pannus fold Pulsed Lavage with Suction (psi): 12 psi Pulsed Lavage with Suction - Normal Saline Used: 1000 mL Pulsed Lavage Tip: Tip with splash shield Selective Debridement Selective Debridement - Location: L buttocks, Pannus fold Selective Debridement - Tools Used: Forceps;Scissors Selective Debridement - Tissue Removed: Yellow and black necrotic tissue   Wound Assessment and Plan  Wound Therapy - Assess/Plan/Recommendations Wound Therapy - Clinical Statement: With an appropriate amount of pain meds, this treatment was able to be completed. This patient will benefit from continued hydrotherapy for selective removal of unviable tissue, to decrease bioburden and promote wound bed healing.  Wound Therapy - Functional Problem List: decreased mobility Factors Delaying/Impairing Wound Healing: Diabetes Mellitus Hydrotherapy Plan: Debridement;Dressing change;Patient/family education;Pulsatile lavage with suction Wound Therapy - Frequency: 6X / week Wound Therapy - Follow Up Recommendations: Skilled nursing facility Wound Plan: see above  Wound Therapy Goals- Improve the function of  patient's integumentary system by progressing the wound(s) through the phases of wound healing (inflammation - proliferation - remodeling) by: Decrease Necrotic Tissue to: 30 Decrease Necrotic Tissue - Progress: Progressing toward goal Increase  Granulation Tissue to: 70 Increase Granulation Tissue - Progress: Progressing toward goal Goals/treatment plan/discharge plan were made with and agreed upon by patient/family: Yes Time For Goal Achievement: 7 days Wound Therapy - Potential for Goals: Good  Goals will be updated until maximal potential achieved or discharge criteria met.  Discharge criteria: when goals achieved, discharge from hospital, MD decision/surgical intervention, no progress towards goals, refusal/missing three consecutive treatments without notification or medical reason.  GP     [name] [name] 05/22/2019, 1:56 PM  05/22/2019  Donnella Sham, PT Acute Rehabilitation Services [phone]  (pager) [phone]  (office)

## 2019-05-22 NOTE — Progress Notes (Signed)
Subjective: Interval History: has complaints pain at abscess site.  Objective: Vital signs in last 24 hours: Temp:  [98 F (36.7 C)-98.4 F (36.9 C)] 98 F (36.7 C) (06/16 0020) Pulse Rate:  [84-89] 89 (06/16 0813) Resp:  [15-20] 15 (06/16 0813) BP: (129-177)/(66-91) 177/91 (06/16 0813) SpO2:  [95 %-97 %] 97 % (06/16 0813) Weight change:   Intake/Output from previous day: 06/15 0701 - 06/16 0700 In: 600 [P.O.:600] Out: 5400 [Urine:5300; Stool:100] Intake/Output this shift: Total I/O In: -  Out: 1500 [Urine:1500]  General appearance: cooperative, morbidly obese, pale and slowed mentation Incision/Wound: packing , good healing, minimal necrotic tiss.  Lab Results: Recent Labs    05/20/19 0350 05/20/19 2022  WBC 12.9* 14.4*  HGB 6.9* 7.7*  HCT 22.8* 24.8*  PLT 396 424*   BMET:  Recent Labs    05/21/19 0506 05/21/19 1604  NA 137 139  K 4.8 3.8  CL 109 115*  CO2 19* 16*  GLUCOSE 167* 143*  BUN 29* 22*  CREATININE 2.79* 1.95*  CALCIUM 8.0* 6.9*   No results for input(s): PTH in the last 72 hours. Iron Studies: No results for input(s): IRON, TIBC, TRANSFERRIN, FERRITIN in the last 72 hours.  Studies/Results: No results found.  I have reviewed the patient's current medications.  Assessment/Plan: 1 AKI contrast, vs AB. Improving, vol xs improving.  Mod acidemia ,will tx. 2 DM per primary 3 Massive obesity 4 Abscess wound care 5 Anemia P Na bicarb, will s/o at this time    LOS: 5 days   Jeneen Rinks Sedonia Kitner 05/22/2019,12:15 PM

## 2019-05-22 NOTE — Progress Notes (Signed)
Discontinued foley catheter and rectal tube after speaking with Dr.

## 2019-05-22 NOTE — Progress Notes (Signed)
Case reviewed for LOS; [name]  Advanced Care Supervisor

## 2019-05-22 NOTE — Progress Notes (Signed)
Family Medicine Teaching Service Daily Progress Note Intern Pager: [phone]  Patient name: [name] Medical record number: [id number] Date of birth: 1976-05-13 Age: 43 y.o. Gender: female  Primary Care Provider: [name], MD Consultants: General Surgery  Code Status: Full  Pt Overview and Major Events to Date:  6/11 Admitted to [facility], I&D Left Buttocks Abscess 6/11 - I+D in OR of gluteal abscess 6/12 - I+D in OR of Gluteal and pannus abscess  Antibiotics: - s/p zosyn (6/11- 6/14) - s/p clindamycin (6/12- 6/14)  - s/p vanc (6/11 - 6/12) - cefazolin (6/14- ) - doxycycline (6/14- )  Assessment and Plan: [name] is a 43 y.o. female presenting with left buttock and pannus abscess and severely elevated blood glucose. PMH is significant for left BKA, amputation all 5 digits right foot, type 2 diabetes with neuropathy, allergic rhinitis, morbid obesity, GERD, right carpal tunnel syndrome, left hand osteoarthritis, obesity hypoventilation syndrome   Left buttock abscess 3 days S/P I&D and debridement  Abdominal Panus abscess 2 days s/p I&D and debridement Wound culture abundant GPR and GPC. BCx NG3D.  Blood cultures show no growth at 3 days.  Stool PCR shows no C. Difficile.  Overall, [name] appears to be improving on IV antibiotics and close monitoring of wounds with proper wound care.  CT abdomen pelvis ordered on 6/15 to further assess abscesses to assess source control. - surgery following, appreciate recs - ID consulted appreciate recommendations - f/u CT abdomen for further assessment of abscess - f/u wound cultures - f/u blood cultures: - cefazolin (6/14- ), per ID - doxycycline (6/14- ), per ID - pain meds: tylenol q4h sch, oxy 5 mg q6h prn, dilaudid 54m q6h prn breakthrough - foley placed - fecal management system - OT SNF - PT SNF  Nausea/Emesis, improved Improved on current regimen. No QTc Prolongation - Phenergan PRN  AKI, improved AKI likely  secondary to antibiotic use.  Improvement today.  Creatinine 3.25>2.79(6/15)>1.95(6/15) - cont 120 cc/hr - monitor Cr closely - monitor UOP - Nephrology consulted, appreciate recommendations.   Hyperglycemia: Improved Hgb A1c 17.5 on 6/11.  Home meds: insulin 70/30. Blood glucose 143-194 in the past 24 hours.  40 units glargine, 12 units aspart given in the past 24 hours. - Lantus 40u QD - rSSI + QHS coverage - CBGs QAC/HS  Acute Encephalopathy, improved Mentating well on exam today. - cont to monitor  Anemia, worse Hgb 7.7 improved from 6.9 following 1 unit packed RBCs. - monitor Hg closely - hold DVT PPx until Hgb stable  Morbid obesity BMI 62.  Limited physical activity given amputations. - nutrition consult as outpatient - consider obesity medicine consult as outpatient as well - could also consider adding Victoza to DM regimen for glucose control and weight loss benefit  Bilateral amputations Left BKA, all toes amputated from right foot.  These wound sites are clean dry and intact, well-healed. - no intervention needed  Obesity hypoventilation syndrome - offer CPAP QHS - outpatient sleep study  Asthma Well-controlled.  Takes Symbicort and albuterol as needed for symptoms.   -Belinda Mannanwhile inpatient (formulary)  Diabetic neuropathy Takes gabapentin 1200 mg 3 times daily.   -restart gabapentin  Elevated alk phos 291 on admission, this AM pending.  Likely 2/2 inflammation from large gluteal abscess. - could consider GGT or RUQ if suspicion arises for hepatic process  Severe protein calorie malnutrition Alb 1.7.   FEN/GI: carb modified  PPx: holding given significant Anemia   Disposition: 2-3  additional days of hospitalization anticipated before discharge.  Subjective:  No acute events overnight.  She continues to note significant discomfort in her buttocks.  She is noted mild improvement in the pain in the buttock since the rectal tube was put in.   She reports that she was frustrated with history had not received dinner last night.  Objective: Temp:  [98 F (36.7 C)-98.4 F (36.9 C)] 98 F (36.7 C) (06/16 0020) Pulse Rate:  [84-89] 84 (06/16 0020) Resp:  [18-20] 18 (06/16 0020) BP: (129-151)/(66-77) 129/66 (06/16 0020) SpO2:  [95 %-100 %] 97 % (06/16 0020)  Physical Exam:  General: Alert and cooperative and appears to be in no acute distress.  Lying in bed uncomfortably positioned on her right side.  Rectal tube in place.   Cardio: Normal S1 and S2, no S3 or S4. Rhythm is regular. No murmurs or rubs.   Pulm: Clear to auscultation bilaterally, no crackles, wheezing, or diminished breath sounds. Normal respiratory effort Abdomen: Bowel sounds normal. Abdomen soft and non-tender.  Extremities: No peripheral edema. Warm/ well perfused.  Strong radial pulse Neuro: Cranial nerves grossly intact  Laboratory: Recent Labs  Lab 05/19/19 1821 05/20/19 0350 05/20/19 2022  WBC 15.2* 12.9* 14.4*  HGB 7.3* 6.9* 7.7*  HCT 23.6* 22.8* 24.8*  PLT 384 396 424*   Recent Labs  Lab 05/17/19 0640  05/20/19 0350 05/21/19 0506 05/21/19 1604  NA 127*   < > 132* 137 139  K 4.5   < > 4.8 4.8 3.8  CL 95*   < > 103 109 115*  CO2 22   < > 18* 19* 16*  BUN 15   < > 29* 29* 22*  CREATININE 1.01*   < > 3.25* 2.79* 1.95*  CALCIUM 8.6*   < > 7.7* 8.0* 6.9*  PROT 6.7  --   --   --   --   BILITOT 0.2*  --   --   --   --   ALKPHOS 291*  --   --   --   --   ALT 14  --   --   --   --   AST 10*  --   --   --   --   GLUCOSE 588*   < > 152* 167* 143*   < > = values in this interval not displayed.   Imaging/Diagnostic Tests: No results found.  Belinda Haymaker, MD 05/22/2019, 6:18 AM PGY-1, [place] Intern pager: [phone], text pages welcome

## 2019-05-22 NOTE — Progress Notes (Signed)
RT placed patient on home CPAP. Patient tolerating well at this time. RT will monitor as needed. 

## 2019-05-22 NOTE — Progress Notes (Signed)
Occupational Therapy Treatment Patient Details Name: [name] MRN: [id number] DOB: 09-10-1976 Today's Date: 05/22/2019    History of present illness Belinda Banksis a 43 y.o.femalepresenting with left buttock abscess and severely elevated blood glucose. PMH is significant forleft BKA, amputation all 5 digits right foot, type 2 diabetes with neuropathy, allergic rhinitis, morbid obesity, GERD, right carpal tunnel syndrome, left hand osteoarthritis, obesity hypoventilation syndrome   OT comments  Upon arrival pt asleep, awoke with OT knock. Pt appeared agitated and declined all mobility this date. Session focused on education of importance of positional changes to reduce risk of skin breakdown. Pt continued to adamantly decline mobility despite education. Pt required setupA to drink and wash face while supine in bed. Pt will continue to benefit from skilled OT services to maximize safety and independence with ADL/IADL and functional mobility. Will continue to follow acutely and progress as tolerated.    Follow Up Recommendations  SNF;Supervision/Assistance - 24 hour    Equipment Recommendations  3 in 1 bedside commode;Hospital bed    Recommendations for Other Services      Precautions / Restrictions Precautions Precautions: Fall Restrictions Weight Bearing Restrictions: No       Mobility Bed Mobility               General bed mobility comments: pt adamantly declined  Transfers                 General transfer comment: pt adamantly declined    Balance                                           ADL either performed or assessed with clinical judgement   ADL Overall ADL's : Needs assistance/impaired Eating/Feeding: Set up Eating/Feeding Details (indicate cue type and reason): provided pt with drink;pt able to prop herself a little on her RUE and drink from cup;pt declined wanting to eat lunch at this time Grooming: Set up;Sitting Grooming  Details (indicate cue type and reason): pt washed face while supine                               General ADL Comments: pt agitated and adamant about not moving in bed;focused on education of skin integrity and importance of continuing to stay mobile to relieve pressure off body to decrease risk of skin breakdown     Vision       Perception     Praxis      Cognition Arousal/Alertness: Lethargic Behavior During Therapy: Agitated Overall Cognitive Status: Within Functional Limits for tasks assessed                                 General Comments: pt agitated about having to move stating she "has been tossing back and forth all day";educated pt on importance of moving every 2 hours to decrease risk of developing pressure areas;pt continued to express agitation;        Exercises     Shoulder Instructions       General Comments educated pt on importance of participation with therapy and continuing to change positions every 2 hours to reduce risk of skin breakdown, pt continued to decline with increased agitation.deferred mobility secondary to pt's agitation    Pertinent Vitals/ Pain  Pain Assessment: 0-10 Pain Score: 9  Pain Location: left buttocks Pain Descriptors / Indicators: Discomfort;Grimacing;Guarding Pain Intervention(s): Limited activity within patient's tolerance;Monitored during session  Home Living                                          Prior Functioning/Environment              Frequency  Min 3X/week        Progress Toward Goals  OT Goals(current goals can now be found in the care plan section)  Progress towards OT goals: Not progressing toward goals - comment(pt increased agitation declining mobility this session)  Acute Rehab OT Goals Patient Stated Goal: pt did not state OT Goal Formulation: With patient Time For Goal Achievement: 06/03/19 Potential to Achieve Goals: Fair ADL Goals Pt  Will Perform Grooming: with modified independence;bed level;sitting Pt Will Perform Upper Body Dressing: with supervision;sitting Pt Will Perform Lower Body Dressing: with supervision;with adaptive equipment;sitting/lateral leans  Plan Discharge plan remains appropriate    Co-evaluation                 AM-PAC OT "6 Clicks" Daily Activity     Outcome Measure   Help from another person eating meals?: A Little Help from another person taking care of personal grooming?: A Lot Help from another person toileting, which includes using toliet, bedpan, or urinal?: Total Help from another person bathing (including washing, rinsing, drying)?: Total Help from another person to put on and taking off regular upper body clothing?: Total Help from another person to put on and taking off regular lower body clothing?: Total 6 Click Score: 9    End of Session    OT Visit Diagnosis: Unsteadiness on feet (R26.81);Other abnormalities of gait and mobility (R26.89);Muscle weakness (generalized) (M62.81);Pain Pain - part of body: (L buttocks)   Activity Tolerance Treatment limited secondary to agitation   Patient Left in bed;with call bell/phone within reach;with bed alarm set   Nurse Communication Mobility status;Other (comment)(pt agitation declining mobility)        Time: 6754-4920 OT Time Calculation (min): 9 min  Charges: OT General Charges $OT Visit: 1 Visit OT Treatments $Self Care/Home Management : 8-22 mins  Dorinda Hill OTR/L Acute Rehabilitation Services Office: [place] 05/22/2019, 2:17 PM

## 2019-05-22 NOTE — Significant Event (Signed)
For 6/16 at 1900 to 6/17 at 0700, please use pager [phone].  Pager [phone] is not working.  Bailey Meccariello, D.O.  PGY-1 Family Medicine  05/22/2019 7:49 PM   

## 2019-05-22 NOTE — Progress Notes (Signed)
Inpatient Diabetes Program Recommendations  AACE/ADA: New Consensus Statement on Inpatient Glycemic Control (2015)  Target Ranges:  Prepandial:   less than 140 mg/dL      Peak postprandial:   less than 180 mg/dL (1-2 hours)      Critically ill patients:  140 - 180 mg/dL   Lab Results  Component Value Date   GLUCAP 140 (H) 05/22/2019   HGBA1C 17.5 (H) 05/17/2019    Review of Glycemic Control Results for ALEENE, SWANNER (MRN [id number]) as of 05/22/2019 14:12  Ref. Range 05/21/2019 17:21 05/21/2019 20:20 05/22/2019 08:11 05/22/2019 12:32  Glucose-Capillary Latest Ref Range: 70 - 99 mg/dL 185 (H) 194 (H) 153 (H) 140 (H)   Diabetes history: Type 2 DM Outpatient Diabetes medications: Humulin 70/30 70 units QAM, 35 units QPM (not taking) Current orders for Inpatient glycemic control: Novolog 0-20 units TID, Novolog 0-5 units QHS, Lantus 40 units QD  Inpatient Diabetes Program Recommendations:    Spoke with patient regarding outpatient diabetes management. Patient admits to not taking insulin as prescribed. She reports seeing Dr Loanne Drilling, endocrinology, back in 2018. But recently switched to a different provider. She cannot remember when this occurred, nor the name of the provider. Per chart review, this appointment was back in May of 2019 and has not followed up since. She states, "They told me at one time to take 700 units at bedtime, so I obviously did not do that. But I just got to where I stopped taking it; I don't care about it. But I tell you I would rather be back on Tresiba." Reviewed patient's current A1c of 17.5%. Explained what a A1c is and what it measures. Also reviewed goal A1c with patient, importance of good glucose control @ home, and blood sugar goals. Reviewed patho of DM, need for insulin, impact of glucose control and infections, vascular changes and comorbidites.  Patient was not checking blood sugars. Patient has a meter, testing supplies and additional 70/30. Encouraged to start  taking insulin and checking blood sugars again. We discussed Tyler Aas as a good option, however, I questioned patient that if she wasn't doing 2 injections why would we increase to a minimum of 4? Patient felt she didn't have good control on the 70/30, but would frequently miss doses and was not checking. Explained that if she would start working on current medications as prescribed, her level of glycemic control would improve and then she could make further adjustments. Encouraged patient to start doing 2 injections and that she would see results of A1C and follow up with endocrinologist. Patient was in agreement to restart plan, check CBGs and follow up. Patient has no furhter questions at this time.   Thanks, Bronson Curb, MSN, RNC-OB Diabetes Coordinator [phone] (8a-5p)

## 2019-05-23 DIAGNOSIS — R52 Pain, unspecified: Secondary | ICD-10-CM

## 2019-05-23 LAB — CBC
HCT: 28.6 % — ABNORMAL LOW (ref 36.0–46.0)
Hemoglobin: 8.9 g/dL — ABNORMAL LOW (ref 12.0–15.0)
MCH: 28.9 pg (ref 26.0–34.0)
MCHC: 31.1 g/dL (ref 30.0–36.0)
MCV: 92.9 fL (ref 80.0–100.0)
Platelets: 588 10*3/uL — ABNORMAL HIGH (ref 150–400)
RBC: 3.08 MIL/uL — ABNORMAL LOW (ref 3.87–5.11)
RDW: 14.7 % (ref 11.5–15.5)
WBC: 12.6 10*3/uL — ABNORMAL HIGH (ref 4.0–10.5)
nRBC: 0 % (ref 0.0–0.2)

## 2019-05-23 LAB — GLUCOSE, CAPILLARY
Glucose-Capillary: 116 mg/dL — ABNORMAL HIGH (ref 70–99)
Glucose-Capillary: 152 mg/dL — ABNORMAL HIGH (ref 70–99)
Glucose-Capillary: 127 mg/dL — ABNORMAL HIGH (ref 70–99)

## 2019-05-23 LAB — BASIC METABOLIC PANEL
Anion gap: 11 (ref 5–15)
BUN: 15 mg/dL (ref 6–20)
CO2: 23 mmol/L (ref 22–32)
Calcium: 8.9 mg/dL (ref 8.9–10.3)
Chloride: 108 mmol/L (ref 98–111)
Creatinine, Ser: 1.18 mg/dL — ABNORMAL HIGH (ref 0.44–1.00)
GFR calc Af Amer: >60 mL/min (ref >60)
GFR calc non Af Amer: 57 mL/min — ABNORMAL LOW (ref >60)
Glucose, Bld: 124 mg/dL — ABNORMAL HIGH (ref 70–99)
Potassium: 3.9 mmol/L (ref 3.5–5.1)
Sodium: 142 mmol/L (ref 135–145)

## 2019-05-23 LAB — CULTURE, BLOOD (ROUTINE X 2)
Culture: NO GROWTH
Special Requests: ADEQUATE

## 2019-05-23 MED ORDER — HYDROMORPHONE HCL 1 MG/ML IJ SOLN
1.5000 mg | INTRAMUSCULAR | Status: DC | PRN
  Administered 2019-05-23: 1.5 mg via INTRAVENOUS
  Filled 2019-05-23: qty 1.5

## 2019-05-23 MED ORDER — FLUCONAZOLE 100 MG PO TABS
200.0000 mg | ORAL_TABLET | Freq: Every day | ORAL | Status: AC
  Administered 2019-05-23 – 2019-05-24 (×2): 200 mg via ORAL
  Filled 2019-05-23 (×3): qty 2

## 2019-05-23 MED ORDER — NYSTATIN 100000 UNIT/GM EX CREA
TOPICAL_CREAM | Freq: Two times a day (BID) | CUTANEOUS | Status: DC
  Administered 2019-05-23 – 2019-05-25 (×4): via TOPICAL
  Filled 2019-05-23: qty 15

## 2019-05-23 MED ORDER — FLORANEX PO PACK
1.0000 g | PACK | Freq: Three times a day (TID) | ORAL | Status: DC
  Filled 2019-05-23: qty 1

## 2019-05-23 MED ORDER — NYSTATIN 100000 UNIT/GM EX CREA: TOPICAL_CREAM | Freq: Three times a day (TID) | CUTANEOUS | Status: DC

## 2019-05-23 MED ORDER — JUVEN PO PACK
1.0000 | PACK | Freq: Two times a day (BID) | ORAL | Status: DC
  Administered 2019-05-23 – 2019-05-25 (×2): 1 via ORAL
  Filled 2019-05-23 (×2): qty 1

## 2019-05-23 MED ORDER — BACID PO TABS
2.0000 | ORAL_TABLET | Freq: Three times a day (TID) | ORAL | Status: DC
  Administered 2019-05-23 – 2019-05-25 (×6): 2 via ORAL
  Filled 2019-05-23 (×6): qty 2

## 2019-05-23 MED ORDER — ENSURE MAX PROTEIN PO LIQD
11.0000 [oz_av] | Freq: Every day | ORAL | Status: DC
  Administered 2019-05-24: 11 [oz_av] via ORAL
  Filled 2019-05-23 (×3): qty 330

## 2019-05-23 MED ORDER — SACCHAROMYCES BOULARDII 250 MG PO CAPS
250.0000 mg | ORAL_CAPSULE | Freq: Two times a day (BID) | ORAL | Status: DC
  Administered 2019-05-23 – 2019-05-25 (×5): 250 mg via ORAL
  Filled 2019-05-23 (×5): qty 1

## 2019-05-23 MED ORDER — OXYCODONE HCL 5 MG PO TABS
10.0000 mg | ORAL_TABLET | ORAL | Status: DC | PRN
  Administered 2019-05-24 – 2019-05-25 (×3): 10 mg via ORAL
  Filled 2019-05-23 (×3): qty 2

## 2019-05-23 NOTE — Progress Notes (Signed)
Nutrition Follow-up  RD working remotely.  DOCUMENTATION CODES:   Morbid obesity  INTERVENTION:   -Continue MVI with minerals daily -D/c Prostat, due to poor acceptance -Decrease Ensure Max to daily, each supplement provides 150 kcals and 30 grams protein -1 packet Juven BID, each packet provides 95 calories, 2.5 grams of protein (collagen), and 9.8 grams of carbohydrate (3 grams sugar); also contains 7 grams of L-arginine and L-glutamine, 300 mg vitamin C, 15 mg vitamin E, 1.2 mcg vitamin B-12, 9.5 mg zinc, 200 mg calcium, and 1.5 g  Calcium Beta-hydroxy-Beta-methylbutyrate to support wound healing  NUTRITION DIAGNOSIS:   Increased nutrient needs related to wound healing, post-op healing as evidenced by estimated needs.  Ongoing  GOAL:   Patient will meet greater than or equal to 90% of their needs  Progressing   MONITOR:   PO intake, Supplement acceptance, Labs, Weight trends, Skin, I & O's  REASON FOR ASSESSMENT:   Consult Assessment of nutrition requirement/status  ASSESSMENT:   [name] is a 43 y.o. female presenting with right buttock abscess and severely elevated blood glucose. PMH is significant for left BKA, amputation all 5 digits right foot, type 2 diabetes with neuropathy, allergic rhinitis, morbid obesity, GERD, right carpal tunnel syndrome, left hand osteoarthritis, obesity hypoventilation  6/11- s/p I&D of lt buttock abscess 6/12- s/p I&D of lt lower abdominal pannus abscess 6/13- PICC placed, hydrotherapy initiated 6/15- rectal tube placed 6/16- rectal tube removed  Meal intake documented at 10-25%, however, chart review implies that intake has improved. Also noted nursing has been offering pt snacks. Pt is refusing Prostat, but consuming one Ensure Max supplement daily.  Per general surgery notes, wounds improving and pt continues with hydrotherapy.   Labs reviewed: CBGS: 109-120 (inpatient orders for glycemic control are 0-20 units insulin aspart  TID with meals, 0-5 units insulin aspart q HS, and 40 units insulin glargine daily).   Diet Order:   Diet Order            Diet Carb Modified Fluid consistency: Thin; Room service appropriate? No  Diet effective now              EDUCATION NEEDS:   No education needs have been identified at this time  Skin:  Skin Assessment: Skin Integrity Issues: Skin Integrity Issues:: Incisions, Other (Comment) Incisions: abdomen, buttocks Other: partial thickness wound on lt buttocks with early granulation tissue  Last BM:  05/22/19  Height:   Ht Readings from Last 1 Encounters:  05/18/19 5\' 2"  (1.575 m)    Weight:   Wt Readings from Last 1 Encounters:  05/21/19 (!) 155 kg    Ideal Body Weight:  46.8 kg(adjusted for lt BKA)  BMI:  Body mass index is 62.5 kg/m.  Estimated Nutritional Needs:   Kcal:  1650-1850  Protein:  100-115 grams  Fluid:  > 1.6 L    Kadarius Cuffe A. Jimmye Norman, RD, LDN, [place] Registered Dietitian II Certified Diabetes Care and Education Specialist Pager: [phone] After hours Pager: [phone]

## 2019-05-23 NOTE — Progress Notes (Signed)
Family Medicine Teaching Service Daily Progress Note Intern Pager: [phone]  Patient name: [name] Medical record number: [id number] Date of birth: [birth year] Age: 43 y.o. Gender: female  Primary Care Provider: [name], MD Consultants: General Surgery  Code Status: Full  Pt Overview and Major Events to Date:  6/11 Admitted to [facility], I&D Left Buttocks Abscess 6/11 - I+D in OR of gluteal abscess 6/12 - I+D in OR of Gluteal and pannus abscess  Antibiotics: - s/p zosyn (6/11- 6/14) - s/p clindamycin (6/12- 6/14)  - s/p vanc (6/11 - 6/12) - cefazolin (6/14-6/18) - doxycycline (6/14-6/18)  Assessment and Plan: [name] is a 43 y.o. female presenting with left buttock and pannus abscess and severely elevated blood glucose. PMH is significant for left BKA, amputation all 5 digits right foot, type 2 diabetes with neuropathy, allergic rhinitis, morbid obesity, GERD, right carpal tunnel syndrome, left hand osteoarthritis, obesity hypoventilation syndrome   Left buttock abscess 3 days S/P I&D and debridement  Abdominal Panus abscess 2 days s/p I&D and debridement Wound culture abundant GPR and GPC. BCx NG3D.  Blood cultures show no growth at 5 days.  Afebrile overnight with normal vitals.  Transition to p.o. antibiotics on 6/16. - surgery following, appreciate recs - ID following, appreciate recommendations - f/u wound cultures - f/u blood cultures: - PO cefazolin (6/14- ) - PO doxycycline (6/14- ) - Tylenol 650 every 4 hours scheduled - Oxycodone 10 mg every 4 hours as needed - OT SNF - PT SNF  AKI, improved AKI likely secondary to antibiotic use.  Improvement today.  Creatinine 3.25>2.79(6/15)>1.95(6/15)>1.5>1.18(6/17) - cont 120 cc/hr - monitor Cr closely - monitor UOP - Nephrology consulted, appreciate recommendations.   Nausea/Emesis, improved Improved on current regimen. No QTc Prolongation - Phenergan PRN  Hyperglycemia: Improved Hgb A1c 17.5 on 6/11.   Home meds: insulin 70/30. Blood glucose 109-153 in the past 24 hours.  40 units Lantus given 7 units aspart. - Lantus 40u QD - rSSI + QHS coverage - monitor CBGs   Acute Encephalopathy, improved - cont to monitor  Anemia, worse Hgb 7.7 >8.1>8.9(6/17) - monitor Hg closely  Morbid obesity BMI 62.  Limited physical activity given amputations. - nutrition consult as outpatient - consider obesity medicine consult as outpatient as well - could also consider adding Victoza to DM regimen for glucose control and weight loss benefit  Bilateral amputations Left BKA, all toes amputated from right foot.  These wound sites are clean dry and intact, well-healed. - no intervention needed  Obesity hypoventilation syndrome - offer CPAP QHS - outpatient sleep study  Asthma Well-controlled.  Takes Symbicort and albuterol as needed for symptoms.   [name] while inpatient (formulary)  Diabetic neuropathy Takes gabapentin 1200 mg 3 times daily.   -restart gabapentin  Elevated alk phos 291 on admission, this AM pending.  Likely 2/2 inflammation from large gluteal abscess. - could consider GGT or RUQ if suspicion arises for hepatic process  Severe protein calorie malnutrition Alb 1.7.   FEN/GI: carb modified  PPx: heparin  Disposition: 2-3 additional days of hospitalization anticipated before discharge.  Subjective:  Acute events overnight.  No new complaints this morning.  Felt that her gluteal pain was improving.  Had questions about her treatment moving forward and how much longer she would need to stay in the hospital.  Objective: Temp:  [97.5 F (36.4 C)-98.1 F (36.7 C)] 97.5 F (36.4 C) (06/17 0411) Pulse Rate:  [79-92] 92 (06/17 0411) Resp:  [15-20] 16 (06/17  0048) BP: (127-181)/(58-106) 181/103 (06/17 0411) SpO2:  [78 %-98 %] 97 % (06/17 0411)  Physical Exam:  General: Alert and cooperative and appears to be in no acute distress. Cardio: Normal S1 and S2, no S3 or  S4. Rhythm is regular. No murmurs or rubs.   Pulm: Clear to auscultation bilaterally, no crackles, wheezing, or diminished breath sounds. Normal respiratory effort Abdomen: Bowel sounds normal. Abdomen soft and non-tender.  Extremities: No peripheral edema. Warm/ well perfused.  Strong radial pulse. Neuro: Cranial nerves grossly intact   Laboratory: Recent Labs  Lab 05/20/19 2022 05/22/19 1430 05/23/19 0418  WBC 14.4* 12.0* 12.6*  HGB 7.7* 8.1* 8.9*  HCT 24.8* 25.7* 28.6*  PLT 424* 510* 588*   Recent Labs  Lab 05/17/19 0640  05/21/19 1604 05/22/19 1430 05/23/19 0418  NA 127*   < > 139 141 142  K 4.5   < > 3.8 4.2 3.9  CL 95*   < > 115* 112* 108  CO2 22   < > 16* 22 23  BUN 15   < > 22* 18 15  CREATININE 1.01*   < > 1.95* 1.50* 1.18*  CALCIUM 8.6*   < > 6.9* 8.5* 8.9  PROT 6.7  --   --   --   --   BILITOT 0.2*  --   --   --   --   ALKPHOS 291*  --   --   --   --   ALT 14  --   --   --   --   AST 10*  --   --   --   --   GLUCOSE 588*   < > 143* 141* 124*   < > = values in this interval not displayed.   Imaging/Diagnostic Tests: No results found.  Belinda Haymaker, MD 05/23/2019, 6:02 AM PGY-1, [place] Intern pager: [phone], text pages welcome

## 2019-05-23 NOTE — Progress Notes (Signed)
Assisted patient with cleaning peri area and reapplied groin dressing at 1945 during shift change, day RN present.   During medication administration at 2215 assisted patient with using bedpan and cleaning wound.   Completed dressing change at 0345 during patient care (CHG and linen change completed). Patient refused care from NT.    Approximately 0500 notified by patient purwick leaking. Informed patient peri care and linen change would need to be completed. Patient inquired about process for having bowel movement and expressed concern/frustration about having to clean wound with each stool. Patient inquired why flexiseal and foley removed since needed frequent peri care. RN inquired if patient felt flexi/foley would be helpful at this time and patient indicated it may be helpful. Inquired about patient's intended discharge date and informed care team may have discontinued interventions per discharge and/or because of patient's expressions of discomfort. Informed patient more appropriate for care team to discuss rationale for discontinuing flexi/foley. Encouraged patient to discuss concerns with care team and informed will have day RN follow up with care team about concerns.

## 2019-05-23 NOTE — Progress Notes (Signed)
For 6/17 0700 to 1900, please page [phone].  Pager [phone] is not working. Brad Thompson, MD, MS FAMILY MEDICINE RESIDENT - PGY2 05/23/2019 2:15 PM 

## 2019-05-23 NOTE — Significant Event (Signed)
For 6/17 at 1900 to 6/18 at 0700, please page [phone].  Pager [phone] is not working.  [place] Constable, D.O.  PGY-1 Family Medicine  05/23/2019 7:35 PM

## 2019-05-23 NOTE — Progress Notes (Signed)
Home CPAP at bedside. RT will monitor as needed.

## 2019-05-23 NOTE — Progress Notes (Signed)
PT Cancellation Note  Patient Details Name: [name] MRN: [id number] DOB: [birth year]   Cancelled Treatment:     Therapy tried to see patient on 3 occasions. On the first occasion she reported she had just had a bowel movement. On  The second occasion she repoted she was in pain from being cleaned. On the third occasion she reported she was too tired from all the other activity. She requested therapy come at 9:00 Am tomorrow. Therapy will attempt to come as early as able. She reports she will be going home.    Carney Living 05/23/2019, 4:28 PM

## 2019-05-23 NOTE — Progress Notes (Addendum)
5 Days Post-Op    CC: Buttocks abscess-pannus abscess  Subjective: Patient is undergoing hydrotherapy with twice daily dressing changes.  At hydro-today the pannus wound looks good.  The buttocks abscess site is again stained with stool.  It has a soupy film over portions of it.  No significant necrosis or additional tissue that needs debridement.  It takes 2 people to move her in the bed and she has a wick in place.  She continues to have Candida infection is becoming progressively worse.  This is perineal buttocks and both thighs.  She has been on multiple antibiotics as noted below.  She does have the inner dry to place under her pannus.  But we have never been able to secure the Aquacel to pack in the abdominal wound.  Objective: Vital signs in last 24 hours: Temp:  [97.5 F (36.4 C)-98.1 F (36.7 C)] 98 F (36.7 C) (06/17 0814) Pulse Rate:  [79-92] 88 (06/17 0814) Resp:  [16-20] 20 (06/17 0814) BP: (127-181)/(58-106) 173/80 (06/17 0814) SpO2:  [78 %-98 %] 97 % (06/17 0814) Last BM Date: 05/22/19  Intake/Output from previous day: 06/16 0701 - 06/17 0700 In: 300 [P.O.:300] Out: 6075 [Urine:6000; Stool:75] Intake/Output this shift: Total I/O In: -  Out: 800 [Urine:800]  General appearance: alert, cooperative and no distress Skin: Abdominal wound is clean with good granular tissue.  The buttocks wound is still soupy with stool contamination present.  She has significant Candida infection of her perineum thighs and buttocks.  Lab Results:  Recent Labs    05/22/19 1430 05/23/19 0418  WBC 12.0* 12.6*  HGB 8.1* 8.9*  HCT 25.7* 28.6*  PLT 510* 588*    BMET Recent Labs    05/22/19 1430 05/23/19 0418  NA 141 142  K 4.2 3.9  CL 112* 108  CO2 22 23  GLUCOSE 141* 124*  BUN 18 15  CREATININE 1.50* 1.18*  CALCIUM 8.5* 8.9   PT/INR No results for input(s): LABPROT, INR in the last 72 hours.  Recent Labs  Lab 05/17/19 0640 05/21/19 1604  AST 10*  --   ALT 14  --    ALKPHOS 291*  --   BILITOT 0.2*  --   PROT 6.7  --   ALBUMIN 1.7* 1.2*     Lipase     Component Value Date/Time   LIPASE 24 05/08/2019 1927     Medications: . acetaminophen  650 mg Oral Q4H  . atorvastatin  20 mg Oral Daily  . cephALEXin  500 mg Oral Q6H  . Chlorhexidine Gluconate Cloth  6 each Topical Q0600  . doxycycline  100 mg Oral Q12H  . feeding supplement (PRO-STAT SUGAR FREE 64)  30 mL Oral TID WC  . heparin  5,000 Units Subcutaneous Q8H  . insulin aspart  0-20 Units Subcutaneous TID WC  . insulin aspart  0-5 Units Subcutaneous QHS  . insulin glargine  40 Units Subcutaneous Daily  . mometasone-formoterol  2 puff Inhalation BID  . multivitamin with minerals  1 tablet Oral Daily  . Ensure Max Protein  11 oz Oral BID WC  . sodium bicarbonate  1,300 mg Oral BID  . sodium chloride flush  10-40 mL Intracatheter Q12H   . lactated ringers 10 mL/hr at 05/18/19 1347   Anti-infectives (From admission, onward)   Start     Dose/Rate Route Frequency Ordered Stop   05/22/19 1800  doxycycline (VIBRA-TABS) tablet 100 mg     100 mg Oral Every 12 hours 05/22/19  1133     05/22/19 1200  cephALEXin (KEFLEX) capsule 500 mg     500 mg Oral Every 6 hours 05/22/19 1133     05/20/19 2200  ceFAZolin (ANCEF) IVPB 2g/100 mL premix  Status:  Discontinued     2 g 200 mL/hr over 30 Minutes Intravenous Every 8 hours 05/20/19 1403 05/22/19 1133   05/20/19 1500  doxycycline (VIBRAMYCIN) 100 mg in sodium chloride 0.9 % 250 mL IVPB  Status:  Discontinued     100 mg 125 mL/hr over 120 Minutes Intravenous Every 12 hours 05/20/19 1357 05/22/19 1133   05/19/19 1800  piperacillin-tazobactam (ZOSYN) IVPB 3.375 g  Status:  Discontinued     3.375 g 12.5 mL/hr over 240 Minutes Intravenous Every 8 hours 05/19/19 1252 05/20/19 1357   05/19/19 0015  piperacillin-tazobactam (ZOSYN) IVPB 2.25 g  Status:  Discontinued     2.25 g 100 mL/hr over 30 Minutes Intravenous Every 6 hours 05/19/19 0012 05/19/19 1252    05/18/19 2106  vancomycin variable dose per unstable renal function (pharmacist dosing)  Status:  Discontinued      Does not apply See admin instructions 05/18/19 2106 05/19/19 1618   05/18/19 1100  clindamycin (CLEOCIN) IVPB 600 mg  Status:  Discontinued     600 mg 100 mL/hr over 30 Minutes Intravenous Every 8 hours 05/18/19 1022 05/20/19 1357   05/18/19 0900  vancomycin (VANCOCIN) 1,750 mg in sodium chloride 0.9 % 500 mL IVPB  Status:  Discontinued     1,750 mg 250 mL/hr over 120 Minutes Intravenous Every 24 hours 05/17/19 0759 05/18/19 2106   05/17/19 1630  piperacillin-tazobactam (ZOSYN) IVPB 3.375 g  Status:  Discontinued     3.375 g 12.5 mL/hr over 240 Minutes Intravenous Every 8 hours 05/17/19 1609 05/19/19 0011   05/17/19 0730  vancomycin (VANCOCIN) 2,000 mg in sodium chloride 0.9 % 500 mL IVPB     2,000 mg 250 mL/hr over 120 Minutes Intravenous  Once 05/17/19 0729 05/17/19 1130     Specimen Description BUTTOCKS   Special Requests NONE   Gram Stain MODERATE WBC PRESENT, PREDOMINANTLY PMN  ABUNDANT GRAM POSITIVE COCCI  ABUNDANT GRAM POSITIVE RODS   Culture FEW NORMAL SKIN FLORA  NO ANAEROBES ISOLATED     Assessment/Plan Acute renal failure Morbid obesity BMI 62.5 Insulin-dependent diabetes -poor control Left BKA/right trans-met amputation/diabetic neuropathy Obesity hypoventilation syndrome Anemia Severe protein calorie malnutrition COVID negative  Abscess pannus left lower abdominal wall; left gluteal abscess  1.  I&D of left buttocks abscess involving skin and subcutaneous tissue and muscle(15 x 10 x 5) 05/17/2019 Dr. Donnie Mesa.  POD #6 2.  Incision and drainage of left lower abdominal pannus abscess(skin and subcutaneous tissue 6 x 3 x 2 cm) further debridement of left buttocks abscess(skin and subcutaneous tissue 10 x 4 x 4 cm) Dr. Donnie Mesa, 05/18/2019 POD #5   - Candidiasis of the buttocks, perineum and thighs. -Adding Diflucan x2 days, nystatin cream twice           daily FEN: IV fluids/carb modified diet ID: Clindamycin 6/12 - 05/20/19; Zosyn 6/13 - 05/20/19;  Ancef 6/14-6/16/20; Keflex 6/16 >> day 2; doxycycline 6/13 >> IV switched to oral 6/16 >> day 4 Follow-up: To be determined DVT: None -she can have chemical DVT prophylaxis from our standpoint   Wilcox  N[place], Want Mother   [phone]   Plan:  Diflucan x 2 days, nystatin cream to buttocks, perineum and thighs 2 times  daily.  Add a probiotic.  Continue hydrotherapy and wet-to-dry dressings.   LOS: 6 days    Quamaine Webb 05/23/2019 [phone]

## 2019-05-23 NOTE — Progress Notes (Signed)
Physical Therapy Wound Treatment Patient Details  Name: [name] MRN: [id number] Date of Birth: 08-29-1976  Today's Date: 05/23/2019 Time: 2536-6440 Time Calculation (min): 84 min  Subjective  Subjective: pt agreeable to hydro Patient and Family Stated Goals: none stated Date of Onset: ("several weeks ago," per pt) Prior Treatments: s/p I&D 6/11, 6/12  Pain Score:    Wound Assessment  Wound / Incision (Open or Dehisced) 05/19/19 Buttocks Left (Active)  Wound Image   05/23/19 1646  Dressing Type Gauze (Comment);ABD;Barrier Film (skin prep);Moist to dry 05/23/19 1646  Dressing Changed Changed 05/23/19 1646  Dressing Status Clean;Dry;Intact 05/23/19 1646  Dressing Change Frequency Daily 05/23/19 1646  Site / Wound Assessment Granulation tissue;Painful;Pale;Pink;Red;Yellow 05/23/19 1646  % Wound base Red or Granulating 50% 05/23/19 1646  % Wound base Yellow/Fibrinous Exudate 50% 05/23/19 1646  % Wound base Black/Eschar 0% 05/23/19 1646  % Wound base Other/Granulation Tissue (Comment) 0% 05/23/19 1646  Peri-wound Assessment Edema;Pink;Excoriated 05/23/19 1646  Wound Length (cm) 9.5 cm 05/19/19 1600  Wound Width (cm) 6 cm 05/19/19 1600  Wound Depth (cm) 5 cm 05/19/19 1600  Wound Volume (cm^3) 285 cm^3 05/19/19 1600  Wound Surface Area (cm^2) 57 cm^2 05/19/19 1600  Margins Unattached edges (unapproximated) 05/23/19 1646  Drainage Amount Minimal 05/23/19 1646  Drainage Description Serosanguineous 05/23/19 1646  Non-staged Wound Description Partial thickness 05/23/19 0900  Treatment Cleansed;Debridement (Selective);Hydrotherapy (Pulse lavage);Packing (Saline gauze) 05/23/19 1646     Wound / Incision (Open or Dehisced) 05/21/19 Non-pressure wound Abdomen Left (Active)  Wound Image   05/23/19 1646  Dressing Type ABD;Barrier Film (skin prep);Gauze (Comment);Moist to dry 05/23/19 1646  Dressing Changed Changed 05/23/19 1646  Dressing Status Clean;Dry;Intact 05/23/19 1646  Dressing  Change Frequency Daily 05/23/19 1646  Site / Wound Assessment Pink;Yellow;Black 05/23/19 1646  % Wound base Red or Granulating 10% 05/23/19 1646  % Wound base Yellow/Fibrinous Exudate 80% 05/23/19 1646  % Wound base Black/Eschar 10% 05/23/19 1646  Peri-wound Assessment Intact;Erythema (blanchable) 05/23/19 1646  Wound Length (cm) 5 cm 05/21/19 1100  Wound Width (cm) 7 cm 05/21/19 1100  Wound Depth (cm) 3.4 cm 05/21/19 1100  Wound Volume (cm^3) 119 cm^3 05/21/19 1100  Wound Surface Area (cm^2) 35 cm^2 05/21/19 1100  Margins Unattached edges (unapproximated) 05/23/19 1646  Closure None 05/23/19 1646  Drainage Amount Minimal 05/23/19 1646  Drainage Description Serosanguineous 05/23/19 1646  Treatment Cleansed;Debridement (Selective);Hydrotherapy (Pulse lavage);Packing (Saline gauze) 05/23/19 1646      Hydrotherapy Pulsed lavage therapy - wound location: L buttocks, Pannus fold Pulsed Lavage with Suction (psi): 12 psi Pulsed Lavage with Suction - Normal Saline Used: 1000 mL Pulsed Lavage Tip: Tip with splash shield Selective Debridement Selective Debridement - Location: L buttocks, Pannus fold Selective Debridement - Tools Used: Forceps;Scissors Selective Debridement - Tissue Removed: Yellow and black necrotic tissue   Wound Assessment and Plan  Wound Therapy - Assess/Plan/Recommendations Wound Therapy - Clinical Statement: With an appropriate amount of pain meds, this treatment was able to be completed. This patient will benefit from continued hydrotherapy for selective removal of unviable tissue, to decrease bioburden and promote wound bed healing.  Wound Therapy - Functional Problem List: decreased mobility Factors Delaying/Impairing Wound Healing: Diabetes Mellitus;Other (comment)(stool constantly in the wound) Hydrotherapy Plan: Debridement;Dressing change;Patient/family education;Pulsatile lavage with suction Wound Therapy - Frequency: 6X / week Wound Therapy - Follow Up  Recommendations: Skilled nursing facility Wound Plan: see above  Wound Therapy Goals- Improve the function of patient's integumentary system by progressing the wound(s) through the phases of  wound healing (inflammation - proliferation - remodeling) by: Decrease Necrotic Tissue to: 30 Decrease Necrotic Tissue - Progress: Progressing toward goal Increase Granulation Tissue to: 70 Increase Granulation Tissue - Progress: Progressing toward goal Goals/treatment plan/discharge plan were made with and agreed upon by patient/family: Yes Time For Goal Achievement: 7 days Wound Therapy - Potential for Goals: Good  Goals will be updated until maximal potential achieved or discharge criteria met.  Discharge criteria: when goals achieved, discharge from hospital, MD decision/surgical intervention, no progress towards goals, refusal/missing three consecutive treatments without notification or medical reason.  GP     [name] Curties Conigliaro 05/23/2019, 4:51 PM 05/23/2019  Donnella Sham, PT Acute Rehabilitation Services [phone]  (pager) [phone]  (office)

## 2019-05-23 NOTE — Plan of Care (Signed)

## 2019-05-24 ENCOUNTER — Encounter: Payer: Medicaid Other | Admitting: Physical Therapy

## 2019-05-24 LAB — CBC
HCT: 29.6 % — ABNORMAL LOW (ref 36.0–46.0)
Hemoglobin: 9.3 g/dL — ABNORMAL LOW (ref 12.0–15.0)
MCH: 28.8 pg (ref 26.0–34.0)
MCHC: 31.4 g/dL (ref 30.0–36.0)
MCV: 91.6 fL (ref 80.0–100.0)
Platelets: 632 10*3/uL — ABNORMAL HIGH (ref 150–400)
RBC: 3.23 MIL/uL — ABNORMAL LOW (ref 3.87–5.11)
RDW: 14.5 % (ref 11.5–15.5)
WBC: 12.7 10*3/uL — ABNORMAL HIGH (ref 4.0–10.5)
nRBC: 0.2 % (ref 0.0–0.2)

## 2019-05-24 LAB — BASIC METABOLIC PANEL WITH GFR
Anion gap: 9 (ref 5–15)
BUN: 11 mg/dL (ref 6–20)
CO2: 28 mmol/L (ref 22–32)
Calcium: 8.8 mg/dL — ABNORMAL LOW (ref 8.9–10.3)
Chloride: 104 mmol/L (ref 98–111)
Creatinine, Ser: 1.18 mg/dL — ABNORMAL HIGH (ref 0.44–1.00)
GFR calc Af Amer: >60 mL/min
GFR calc non Af Amer: 57 mL/min — ABNORMAL LOW
Glucose, Bld: 134 mg/dL — ABNORMAL HIGH (ref 70–99)
Potassium: 3.6 mmol/L (ref 3.5–5.1)
Sodium: 141 mmol/L (ref 135–145)

## 2019-05-24 LAB — GLUCOSE, CAPILLARY
Glucose-Capillary: 136 mg/dL — ABNORMAL HIGH (ref 70–99)
Glucose-Capillary: 113 mg/dL — ABNORMAL HIGH (ref 70–99)
Glucose-Capillary: 120 mg/dL — ABNORMAL HIGH (ref 70–99)
Glucose-Capillary: 167 mg/dL — ABNORMAL HIGH (ref 70–99)

## 2019-05-24 MED ORDER — AMLODIPINE BESYLATE 10 MG PO TABS
10.0000 mg | ORAL_TABLET | Freq: Every day | ORAL | Status: DC
  Administered 2019-05-24 – 2019-05-25 (×2): 10 mg via ORAL
  Filled 2019-05-24 (×2): qty 1

## 2019-05-24 NOTE — Progress Notes (Signed)
Patentt has displayed violent like behavior with staff, along with name calling and verbally abusive  personal insults. Yells at staff without cause after calling staff in room for basic needs.  Frequently ignores nurse while providing care, but then answers in an abrupt, loud, and sarcastic tone and manner.  While assisting to turn, patient gets physically aggressive, by pulling back with hard, sudden, jerk like movements.  At one point, patient cursed nurse, lashed forward as to charge and hit, but then pulled back and repeatedly began punching side rail while screaming "son of a bitch" over and over.  Plan for safety,  2 staff members in room at a time while providing care.

## 2019-05-24 NOTE — Significant Event (Signed)
For 6/18 at 1900 to 6/19 at 0700, please page [phone].  Pager [phone] is not working.  Bailey Meccariello, D.O.  PGY-1 Family Medicine  05/24/2019 8:06 PM   

## 2019-05-24 NOTE — Progress Notes (Signed)
Physical Therapy Treatment Patient Details Name: [name] MRN: [id number] DOB: 01/08/1976 Today's Date: 05/24/2019    History of Present Illness Belinda Banksis a 43 y.o.femalepresenting with left buttock abscess and severely elevated blood glucose. PMH is significant forleft BKA, amputation all 5 digits right foot, type 2 diabetes with neuropathy, allergic rhinitis, morbid obesity, GERD, right carpal tunnel syndrome, left hand osteoarthritis, obesity hypoventilation syndrome    PT Comments    [employer] being very hesitant to work with therapy  The patient did fairly well with bed mobility. She was unable to sit up for long 2nd to pain but she only required min a. She continues to decline SNF but she has yet to show therapy how she is going to be able to transfer. Therapy will continue to progress patient as tolerated.   Follow Up Recommendations  SNF;Home health PT     Equipment Recommendations  None recommended by PT    Recommendations for Other Services Rehab consult     Precautions / Restrictions Precautions Precautions: Fall Restrictions Weight Bearing Restrictions: No    Mobility  Bed Mobility Overal bed mobility: Needs Assistance Bed Mobility: Rolling;Supine to Sit Rolling: Modified independent (Device/Increase time)   Supine to sit: Min assist     General bed mobility comments: rolled without assistance so her matress could be cleaned. Min a to sit up at the edge. Patient couldnt sit up very long because of pain. Min a to lie [name] comment: deferred   Ambulation/Gait                 Stairs             Wheelchair Mobility    Modified Rankin (Stroke Patients Only)       Balance Overall balance assessment: Needs assistance Sitting-balance support: Bilateral upper extremity supported Sitting balance-Leahy Scale: Poor Sitting balance - Comments: pt requires BUE while sitting EOB;able to  tolerate sitting for about 49min secondary to pain, weight shifting from R<>L to relieve pressure on buttocks when directly upright                                    Cognition Arousal/Alertness: Lethargic Behavior During Therapy: Agitated;WFL for tasks assessed/performed Overall Cognitive Status: Within Functional Limits for tasks assessed                                 General Comments: pt agitated upon arrival, pt highly motivated by daughter [name]. Pt required max encouragement to participate during session but agreeable when discussing continuing to 'participate during therapy to get stronger and get back home to her daughter'      Exercises      General Comments General comments (skin integrity, edema, etc.): significant education on the improtance of mobility.       Pertinent Vitals/Pain Pain Assessment: 0-10 Pain Score: 8  Pain Location: left buttocks Pain Descriptors / Indicators: Discomfort;Grimacing;Guarding Pain Intervention(s): Monitored during session;Limited activity within patient's tolerance;Repositioned    Home Living                      Prior Function            PT Goals (current goals can now be found  in the care plan section) Acute Rehab PT Goals Patient Stated Goal: Patient is very hesitant to work with therapy. She required significant encouragement, education, and cuing  PT Goal Formulation: With patient Time For Goal Achievement: 05/28/19 Potential to Achieve Goals: Good Progress towards PT goals: Progressing toward goals    Frequency    Min 3X/week      PT Plan Current plan remains appropriate    Co-evaluation PT/OT/SLP Co-Evaluation/Treatment: Yes Reason for Co-Treatment: Complexity of the patient's impairments (multi-system involvement) PT goals addressed during session: Mobility/safety with mobility;Balance;Proper use of DME;Strengthening/ROM OT goals addressed during session: ADL's and  self-care      AM-PAC PT "6 Clicks" Mobility   Outcome Measure  Help needed turning from your back to your side while in a flat bed without using bedrails?: A Lot Help needed moving from lying on your back to sitting on the side of a flat bed without using bedrails?: A Lot Help needed moving to and from a bed to a chair (including a wheelchair)?: Total Help needed standing up from a chair using your arms (e.g., wheelchair or bedside chair)?: Total Help needed to walk in hospital room?: Total Help needed climbing 3-5 steps with a railing? : Total 6 Click Score: 8    End of Session Equipment Utilized During Treatment: Gait belt Activity Tolerance: Patient limited by pain Patient left: in bed;with call bell/phone within reach;with bed alarm set Nurse Communication: Mobility status PT Visit Diagnosis: Unsteadiness on feet (R26.81);Muscle weakness (generalized) (M62.81);Other abnormalities of gait and mobility (R26.89)     Time: 2244-9753 PT Time Calculation (min) (ACUTE ONLY): 29 min  Charges:  $Therapeutic Activity: 8-22 mins                        Carney Living PT DPT  05/24/2019, 1:22 PM

## 2019-05-24 NOTE — Plan of Care (Signed)
  Problem: Education: Goal: Knowledge of General Education information will improve Description: Including pain rating scale, medication(s)/side effects and non-pharmacologic comfort measures Outcome: Progressing   Problem: Activity: Goal: Risk for activity intolerance will decrease Outcome: Not Progressing   Problem: Pain Managment: Goal: General experience of comfort will improve Outcome: Progressing   Problem: Safety: Goal: Ability to remain free from injury will improve Outcome: Progressing

## 2019-05-24 NOTE — Progress Notes (Signed)
Physical Therapy Wound Treatment Patient Details  Name: [name] MRN: [id number] Date of Birth: 01-20-1976  Today's Date: 05/24/2019 Time: 1110-1203 Time Calculation (min): 53 min  Subjective  Subjective: pt agreeable to hydro Patient and Family Stated Goals: none stated Date of Onset: ("several weeks ago," per pt) Prior Treatments: s/p I&D 6/11, 6/12  Pain Score: Pain Score: 6/10, premedicated  Wound Assessment  Wound / Incision (Open or Dehisced) 05/19/19 Buttocks Left (Active)  Wound Image   05/23/19 1646  Dressing Type Gauze (Comment);ABD;Barrier Film (skin prep);Moist to dry 05/24/19 1255  Dressing Changed Changed 05/24/19 1255  Dressing Status Clean;Dry;Intact 05/24/19 1255  Dressing Change Frequency Daily 05/24/19 1255  Site / Wound Assessment Granulation tissue;Painful;Pale;Pink;Red;Yellow 05/24/19 1255  % Wound base Red or Granulating 60% 05/24/19 1255  % Wound base Yellow/Fibrinous Exudate 40% 05/24/19 1255  % Wound base Black/Eschar 0% 05/24/19 1255  % Wound base Other/Granulation Tissue (Comment) 0% 05/24/19 1255  Peri-wound Assessment Edema;Pink;Excoriated;Other (Comment) 05/24/19 1255  Wound Length (cm) 9.5 cm 05/19/19 1600  Wound Width (cm) 6 cm 05/19/19 1600  Wound Depth (cm) 5 cm 05/19/19 1600  Wound Volume (cm^3) 285 cm^3 05/19/19 1600  Wound Surface Area (cm^2) 57 cm^2 05/19/19 1600  Tunneling (cm) no 05/24/19 0001  Margins Unattached edges (unapproximated) 05/24/19 1255  Drainage Amount Minimal 05/24/19 1255  Drainage Description Serosanguineous 05/24/19 1255  Non-staged Wound Description Partial thickness 05/23/19 0900  Treatment Cleansed;Debridement (Selective);Hydrotherapy (Pulse lavage);Packing (Saline gauze) 05/24/19 1255     Wound / Incision (Open or Dehisced) 05/21/19 Non-pressure wound Abdomen Left (Active)  Wound Image   05/23/19 1646  Dressing Type ABD;Barrier Film (skin prep);Gauze (Comment);Moist to dry 05/24/19 1255  Dressing Changed  Changed 05/24/19 1255  Dressing Status Clean;Dry;Intact 05/24/19 1255  Dressing Change Frequency Daily 05/24/19 1255  Site / Wound Assessment Pink;Yellow;Black 05/24/19 1255  % Wound base Red or Granulating 85% 05/24/19 1255  % Wound base Yellow/Fibrinous Exudate 15% 05/24/19 1255  % Wound base Black/Eschar 0% 05/24/19 1255  Peri-wound Assessment Intact;Erythema (blanchable) 05/24/19 1255  Wound Length (cm) 5 cm 05/21/19 1100  Wound Width (cm) 7 cm 05/21/19 1100  Wound Depth (cm) 3.4 cm 05/21/19 1100  Wound Volume (cm^3) 119 cm^3 05/21/19 1100  Wound Surface Area (cm^2) 35 cm^2 05/21/19 1100  Margins Unattached edges (unapproximated) 05/24/19 1255  Closure None 05/24/19 1255  Drainage Amount Minimal 05/24/19 1255  Drainage Description Serosanguineous 05/24/19 1255  Treatment Cleansed 05/24/19 0850      Hydrotherapy Pulsed lavage therapy - wound location: L buttocks, Pannus fold Pulsed Lavage with Suction (psi): 4 psi(to 12) Pulsed Lavage with Suction - Normal Saline Used: 1000 mL Pulsed Lavage Tip: Tip with splash shield Selective Debridement Selective Debridement - Location: L buttocks, Pannus fold Selective Debridement - Tools Used: Forceps;Scissors Selective Debridement - Tissue Removed: Yellow and black necrotic tissue   Wound Assessment and Plan  Wound Therapy - Assess/Plan/Recommendations Wound Therapy - Clinical Statement: With an appropriate amount of pain meds, this treatment was able to be completed. This patient will benefit from continued hydrotherapy for selective removal of unviable tissue, to decrease bioburden and promote wound bed healing.  Wound Therapy - Functional Problem List: decreased mobility Factors Delaying/Impairing Wound Healing: Diabetes Mellitus;Other (comment)(stool constantly in the wound) Hydrotherapy Plan: Debridement;Dressing change;Patient/family education;Pulsatile lavage with suction Wound Therapy - Frequency: 6X / week Wound Therapy - Follow  Up Recommendations: Skilled nursing facility Wound Plan: see above  Wound Therapy Goals- Improve the function of patient's integumentary system by progressing the  wound(s) through the phases of wound healing (inflammation - proliferation - remodeling) by: Decrease Necrotic Tissue to: 30 Decrease Necrotic Tissue - Progress: Progressing toward goal Increase Granulation Tissue to: 70 Increase Granulation Tissue - Progress: Progressing toward goal Goals/treatment plan/discharge plan were made with and agreed upon by patient/family: Yes Time For Goal Achievement: 7 days Wound Therapy - Potential for Goals: Good  Goals will be updated until maximal potential achieved or discharge criteria met.  Discharge criteria: when goals achieved, discharge from hospital, MD decision/surgical intervention, no progress towards goals, refusal/missing three consecutive treatments without notification or medical reason.  GP     [name] Courtnay Petrilla 05/24/2019, 1:01 PM 05/24/2019  Donnella Sham, PT Acute Rehabilitation Services [phone]  (pager) [phone]  (office)

## 2019-05-24 NOTE — Significant Event (Signed)
For 6/18 at 0700 to 1900, please page [phone].  Pager [phone] is not working.  Brad Schyler Butikofer, MD, MS FAMILY MEDICINE RESIDENT - PGY2 05/24/2019 2:58 PM 

## 2019-05-24 NOTE — Progress Notes (Signed)
Family Medicine Teaching Service Daily Progress Note Intern Pager: [phone]  Patient name: [name] Medical record number: [id number] Date of birth: [birth year] Age: 43 y.o. Gender: female  Primary Care Provider: [name], MD Consultants: General Surgery  Code Status: Full  Pt Overview and Major Events to Date:  6/11 Admitted to [facility], I&D Left Buttocks Abscess 6/11 - I+D in OR of gluteal abscess 6/12 - I+D in OR of Gluteal and pannus abscess  Antibiotics: - s/p zosyn (6/11- 6/14) - s/p clindamycin (6/12- 6/14)  - s/p vanc (6/11 - 6/12) - cefazolin (6/14-6/18) - doxycycline (6/14-6/18)  Assessment and Plan: [name] is a 43 y.o. female presenting with left buttock and pannus abscess and severely elevated blood glucose. PMH is significant for left BKA, amputation all 5 digits right foot, type 2 diabetes with neuropathy, allergic rhinitis, morbid obesity, GERD, right carpal tunnel syndrome, left hand osteoarthritis, obesity hypoventilation syndrome   Left buttock abscess 3 days S/P I&D and debridement  Abdominal Panus abscess 2 days s/p I&D and debridement Wound culture abundant GPR and GPC. BCx NG3D.  Blood cultures show no growth at 5 days.  Afebrile overnight with increasing heart rates up to 116 in the early morning.  Last day of antibiotics today. - surgery following, appreciate recs - ID following, appreciate recommendations - f/u wound cultures - f/u blood cultures: - PO cefazolin (6/14- ) - PO doxycycline (6/14- ) - Diflucan, 2 days - Nystatin cream to buttocks perineum and thighs - Probiotic - Tylenol 650 every 4 hours scheduled - Oxycodone 10 mg every 4 hours as needed - OT SNF - PT SNF  AKI, improved AKI likely secondary to antibiotic use.  Improvement today.  Creatinine 3.25>2.79(6/15)>1.95(6/15)>1.5>1.18(6/17)>1.18(6/18) - monitor UOP  Nausea/Emesis, improved Improved on current regimen. No QTc Prolongation - Phenergan PRN  Hyperglycemia:  Improved Hgb A1c 17.5 on 6/11.  Home meds: insulin 70/30. Blood sugar 116-152 in the past 24 hours.  40 units Lantus, 4 units aspart given in the past 24 hours. - Lantus 40u QD - rSSI + QHS coverage - monitor CBGs   Acute Encephalopathy, improved - cont to monitor  Anemia, worse Hgb 7.7 >8.1>8.9(6/17)>9.3(6/18) - monitor Hgb  Morbid obesity BMI 62.  Limited physical activity given amputations. - nutrition consult as outpatient - consider obesity medicine consult as outpatient as well - could also consider adding Victoza to DM regimen for glucose control and weight loss benefit  Bilateral amputations Left BKA, all toes amputated from right foot.  These wound sites are clean dry and intact, well-healed. - no intervention needed  Obesity hypoventilation syndrome - offer CPAP QHS - outpatient sleep study  Asthma Well-controlled.  Takes Symbicort and albuterol as needed for symptoms.   Ruthe Mannan while inpatient (formulary)  Diabetic neuropathy Takes gabapentin 1200 mg 3 times daily.   -restart gabapentin  Elevated alk phos 291 on admission, this AM pending.  Likely 2/2 inflammation from large gluteal abscess. - could consider GGT or RUQ if suspicion arises for hepatic process  Severe protein calorie malnutrition Alb 1.7.   FEN/GI: carb modified  PPx: heparin  Disposition: Medically stable and ready for discharge pending SNF placement.  Subjective:  No acute events overnight.  This morning she reports nausea.  She also reports that she did not receive any extra pain medication prior to her dressing changes morning.  After speaking with the nursing staff, it appears that the dressing change this morning was related to a bowel movement and so the dressing  was changed more quickly than they typically would for a planned dressing change.  Objective: Temp:  [97.7 F (36.5 C)-98.3 F (36.8 C)] 97.7 F (36.5 C) (06/17 2136) Pulse Rate:  [87-91] 87 (06/17 2136) Resp:   [20] 20 (06/17 1250) BP: (173-179)/(80-93) 174/80 (06/17 2136) SpO2:  [96 %-99 %] 99 % (06/17 2136)  Physical Exam:  General: Alert and cooperative and appears to be in no acute distress.  Lying in bed comfortably. HEENT: Neck non-tender without lymphadenopathy, masses or thyromegaly Cardio: Normal S1 and S2, no S3 or S4. Rhythm is regular. No murmurs or rubs.   Pulm: Clear to auscultation bilaterally, no crackles, wheezing, or diminished breath sounds. Normal respiratory effort Abdomen: Bowel sounds normal. Abdomen soft and non-tender.  Extremities: No peripheral edema. Warm/ well perfused.  Strong radial pulse. Neuro: Cranial nerves grossly intact  Laboratory: Recent Labs  Lab 05/22/19 1430 05/23/19 0418 05/24/19 0348  WBC 12.0* 12.6* 12.7*  HGB 8.1* 8.9* 9.3*  HCT 25.7* 28.6* 29.6*  PLT 510* 588* 632*   Recent Labs  Lab 05/17/19 0640  05/22/19 1430 05/23/19 0418 05/24/19 0348  NA 127*   < > 141 142 141  K 4.5   < > 4.2 3.9 3.6  CL 95*   < > 112* 108 104  CO2 22   < > _0 BUN 15   < > _1 CREATININE 1.01*   < > 1.50* 1.18* 1.18*  CALCIUM 8.6*   < > 8.5* 8.9 8.8*  PROT 6.7  --   --   --   --   BILITOT 0.2*  --   --   --   --   ALKPHOS 291*  --   --   --   --   ALT 14  --   --   --   --   AST 10*  --   --   --   --   GLUCOSE 588*   < > 141* 124* 134*   < > = values in this interval not displayed.   Imaging/Diagnostic Tests: No results found.  [name], MD 05/24/2019, 6:02 AM PGY-1, [facility] Intern pager: [phone], text pages welcome

## 2019-05-24 NOTE — Progress Notes (Signed)
Occupational Therapy Treatment Patient Details Name: [name] MRN: [id number] DOB: December 13, 1975 Today's Date: 05/24/2019    History of present illness Belinda Banksis a 43 y.o.femalepresenting with left buttock abscess and severely elevated blood glucose. PMH is significant forleft BKA, amputation all 5 digits right foot, type 2 diabetes with neuropathy, allergic rhinitis, morbid obesity, GERD, right carpal tunnel syndrome, left hand osteoarthritis, obesity hypoventilation syndrome   OT comments  Upon arrival, pt appeared agitated and expressed she "does not want to do nothing today". Pt highly motivated by her daughter and agreeable to participate after discussing importance of mobility and participation during therapy to get back home to her daughter. Pt able to roll with modified independence and progress upright to sit EOB with minA. Pt limited by pain and requires weight shift from R<>L while sitting EOB, able to tolerate about 69min. Pt will continue to benefit from skilled OT services to maximize safety and independence with ADL/IADL and functional mobility. Will continue to follow acutely and progress as tolerated.    Follow Up Recommendations  SNF;Supervision/Assistance - 24 hour    Equipment Recommendations  3 in 1 bedside commode;Hospital bed    Recommendations for Other Services      Precautions / Restrictions Precautions Precautions: Fall Restrictions Weight Bearing Restrictions: No       Mobility Bed Mobility Overal bed mobility: Needs Assistance Bed Mobility: Rolling;Supine to Sit Rolling: Modified independent (Device/Increase time)   Supine to sit: Min assist     General bed mobility comments: pt required increased time, effort, and motivation to roll R>L but able to do so with modified independence  Transfers                 General transfer comment: deferred     Balance Overall balance assessment: Needs assistance Sitting-balance support:  Bilateral upper extremity supported Sitting balance-Leahy Scale: Poor Sitting balance - Comments: pt requires BUE while sitting EOB;able to tolerate sitting for about 36min secondary to pain, weight shifting from R<>L to relieve pressure on buttocks when directly upright                                   ADL either performed or assessed with clinical judgement   ADL Overall ADL's : Needs assistance/impaired Eating/Feeding: Set up Eating/Feeding Details (indicate cue type and reason): pt took medication from nsg after setup while sidelying                         Toileting- Clothing Manipulation and Hygiene: Total assistance Toileting - Clothing Manipulation Details (indicate cue type and reason): pt mod independent in rolling;total A for pericare;purewick ineffective;soiled linens     Functional mobility during ADLs: Minimal assistance General ADL Comments: minA to progress to EOB in preparation for ADL;unable to tolerate sitting directly upright, required extreme leaning to one side     Vision       Perception     Praxis      Cognition Arousal/Alertness: Lethargic Behavior During Therapy: Agitated;WFL for tasks assessed/performed Overall Cognitive Status: Within Functional Limits for tasks assessed                                 General Comments: pt agitated upon arrival, pt highly motivated by daughter [name]. Pt required max encouragement to participate during session but agreeable when  discussing continuing to 'participate during therapy to get stronger and get back home to her daughter'        Exercises     Shoulder Instructions       General Comments continued to educate pt on importance of positional changes and routine skin checks and proper skin hygiene to reduce risk of pressure sores; pt reporting nausea at start of session, RN provided medication    Pertinent Vitals/ Pain       Pain Assessment: 0-10 Pain Score: 8   Pain Location: left buttocks Pain Descriptors / Indicators: Discomfort;Grimacing;Guarding Pain Intervention(s): Limited activity within patient's tolerance;Monitored during session;Repositioned  Home Living                                          Prior Functioning/Environment              Frequency  Min 3X/week        Progress Toward Goals  OT Goals(current goals can now be found in the care plan section)  Progress towards OT goals: Progressing toward goals  Acute Rehab OT Goals Patient Stated Goal: to get home to her daughter OT Goal Formulation: With patient Time For Goal Achievement: 06/03/19 Potential to Achieve Goals: Fair ADL Goals Pt Will Perform Grooming: with modified independence;bed level;sitting Pt Will Perform Upper Body Dressing: with supervision;sitting Pt Will Perform Lower Body Dressing: with supervision;with adaptive equipment;sitting/lateral leans  Plan Discharge plan remains appropriate    Co-evaluation    PT/OT/SLP Co-Evaluation/Treatment: Yes Reason for Co-Treatment: Complexity of the patient's impairments (multi-system involvement);For patient/therapist safety;To address functional/ADL transfers   OT goals addressed during session: ADL's and self-care      AM-PAC OT "6 Clicks" Daily Activity     Outcome Measure   Help from another person eating meals?: A Little Help from another person taking care of personal grooming?: A Lot Help from another person toileting, which includes using toliet, bedpan, or urinal?: A Lot Help from another person bathing (including washing, rinsing, drying)?: A Lot Help from another person to put on and taking off regular upper body clothing?: Total Help from another person to put on and taking off regular lower body clothing?: Total 6 Click Score: 11    End of Session    OT Visit Diagnosis: Unsteadiness on feet (R26.81);Other abnormalities of gait and mobility (R26.89);Muscle weakness  (generalized) (M62.81);Pain Pain - part of body: (L buttocks)   Activity Tolerance Patient tolerated treatment well;Treatment limited secondary to agitation   Patient Left in bed;with call bell/phone within reach;with bed alarm set;with nursing/sitter in room   Nurse Communication Mobility status(pt present upon arrival;provided nausea meds)        Time: 3664-4034 OT Time Calculation (min): 29 min  Charges: OT General Charges $OT Visit: 1 Visit OT Treatments $Self Care/Home Management : 8-22 mins  Dorinda Hill OTR/L Acute Rehabilitation Services Office: [place] 05/24/2019, 10:42 AM

## 2019-05-25 DIAGNOSIS — E1140 Type 2 diabetes mellitus with diabetic neuropathy, unspecified: Secondary | ICD-10-CM | POA: Diagnosis not present

## 2019-05-25 LAB — CBC
HCT: 30.1 % — ABNORMAL LOW (ref 36.0–46.0)
Hemoglobin: 9.4 g/dL — ABNORMAL LOW (ref 12.0–15.0)
MCH: 28.7 pg (ref 26.0–34.0)
MCHC: 31.2 g/dL (ref 30.0–36.0)
MCV: 92 fL (ref 80.0–100.0)
Platelets: 598 10*3/uL — ABNORMAL HIGH (ref 150–400)
RBC: 3.27 MIL/uL — ABNORMAL LOW (ref 3.87–5.11)
RDW: 14.5 % (ref 11.5–15.5)
WBC: 13 10*3/uL — ABNORMAL HIGH (ref 4.0–10.5)
nRBC: 0 % (ref 0.0–0.2)

## 2019-05-25 LAB — BASIC METABOLIC PANEL
Anion gap: 12 (ref 5–15)
BUN: 14 mg/dL (ref 6–20)
CO2: 26 mmol/L (ref 22–32)
Calcium: 8.3 mg/dL — ABNORMAL LOW (ref 8.9–10.3)
Chloride: 100 mmol/L (ref 98–111)
Creatinine, Ser: 1.09 mg/dL — ABNORMAL HIGH (ref 0.44–1.00)
GFR calc Af Amer: >60 mL/min (ref >60)
GFR calc non Af Amer: >60 mL/min (ref >60)
Glucose, Bld: 210 mg/dL — ABNORMAL HIGH (ref 70–99)
Potassium: 3.3 mmol/L — ABNORMAL LOW (ref 3.5–5.1)
Sodium: 138 mmol/L (ref 135–145)

## 2019-05-25 LAB — GLUCOSE, CAPILLARY
Glucose-Capillary: 194 mg/dL — ABNORMAL HIGH (ref 70–99)
Glucose-Capillary: 187 mg/dL — ABNORMAL HIGH (ref 70–99)

## 2019-05-25 MED ORDER — INSULIN GLARGINE 100 UNIT/ML ~~LOC~~ SOLN: 40.0000 [IU] | Freq: Every day | SUBCUTANEOUS | 0 refills | Status: DC

## 2019-05-25 MED ORDER — PSYLLIUM 95 % PO PACK: 1.0000 | PACK | Freq: Every day | ORAL | 0 refills | Status: DC

## 2019-05-25 MED ORDER — ADULT MULTIVITAMIN W/MINERALS CH: 1.0000 | ORAL_TABLET | Freq: Every day | ORAL | 0 refills | Status: DC

## 2019-05-25 MED ORDER — SODIUM BICARBONATE 650 MG PO TABS: 1300.0000 mg | ORAL_TABLET | Freq: Two times a day (BID) | ORAL | 0 refills | Status: DC

## 2019-05-25 MED ORDER — JUVEN PO PACK: 1.0000 | PACK | Freq: Two times a day (BID) | ORAL | 0 refills | Status: DC

## 2019-05-25 MED ORDER — PROMETHAZINE HCL 12.5 MG PO TABS: 12.5000 mg | ORAL_TABLET | Freq: Four times a day (QID) | ORAL | 0 refills | Status: DC | PRN

## 2019-05-25 MED ORDER — OXYCODONE HCL 10 MG PO TABS: 10.0000 mg | ORAL_TABLET | ORAL | 0 refills | Status: DC | PRN

## 2019-05-25 MED ORDER — AMLODIPINE BESYLATE 10 MG PO TABS: 10.0000 mg | ORAL_TABLET | Freq: Every day | ORAL | 0 refills | Status: DC

## 2019-05-25 MED ORDER — INSULIN GLARGINE 100 UNIT/ML ~~LOC~~ SOLN: 40.0000 [IU] | Freq: Every day | SUBCUTANEOUS | 11 refills | Status: DC

## 2019-05-25 MED ORDER — OXYCODONE HCL 10 MG PO TABS: 10.0000 mg | ORAL_TABLET | Freq: Two times a day (BID) | ORAL | 0 refills | Status: DC | PRN

## 2019-05-25 MED ORDER — NYSTATIN 100000 UNIT/GM EX CREA: TOPICAL_CREAM | Freq: Two times a day (BID) | CUTANEOUS | 0 refills | Status: DC

## 2019-05-25 MED ORDER — ENSURE MAX PROTEIN PO LIQD: 11.0000 [oz_av] | Freq: Every day | ORAL | 0 refills | Status: DC

## 2019-05-25 MED ORDER — COLLAGENASE 250 UNIT/GM EX OINT: TOPICAL_OINTMENT | Freq: Every day | CUTANEOUS | 0 refills | Status: DC

## 2019-05-25 MED ORDER — PSYLLIUM 95 % PO PACK
1.0000 | PACK | Freq: Every day | ORAL | Status: DC
  Filled 2019-05-25: qty 1

## 2019-05-25 MED ORDER — BACID PO TABS: 2.0000 | ORAL_TABLET | Freq: Three times a day (TID) | ORAL | 0 refills | Status: DC

## 2019-05-25 MED ORDER — POTASSIUM CHLORIDE CRYS ER 20 MEQ PO TBCR
20.0000 meq | EXTENDED_RELEASE_TABLET | Freq: Once | ORAL | Status: AC
  Administered 2019-05-25: 20 meq via ORAL
  Filled 2019-05-25: qty 1

## 2019-05-25 MED ORDER — COLLAGENASE 250 UNIT/GM EX OINT
TOPICAL_OINTMENT | Freq: Every day | CUTANEOUS | Status: DC
  Filled 2019-05-25: qty 30

## 2019-05-25 NOTE — Progress Notes (Signed)
Nsg Discharge Note  Admit Date:  05/17/2019 Discharge date: 05/25/2019   [name] to be D/C'd Home per MD order.  AVS completed.  Copy for chart, and copy for patient signed, and dated. Patient/caregiver able to verbalize understanding.  Discharge Medication: Allergies as of 05/25/2019      Reactions   Kiwi Extract Shortness Of Breath, Swelling   Cefepime    AKI, see records from [place] hospitalization in January 2020   Trental [pentoxifylline] Nausea And Vomiting   Nubain [nalbuphine Hcl] Other (See Comments)   "FEELS LIKE SOMETHING CRAWLING ON ME"      Medication List    STOP taking these medications   nystatin powder Generic drug: nystatin Replaced by: nystatin cream     TAKE these medications   Accu-Chek Nano SmartView w/Device Kit Use to check blood sugar 3 times daily   Accu-Chek Softclix Lancets lancets Use as instructed to check blood sugar 3 times per day What changed: additional instructions   amLODipine 10 MG tablet Commonly known as: NORVASC Take 1 tablet (10 mg total) by mouth daily.   atorvastatin 20 MG tablet Commonly known as: LIPITOR Take 1 tablet (20 mg total) by mouth daily.   budesonide-formoterol 160-4.5 MCG/ACT inhaler Commonly known as: Symbicort inhale 2 PUFFS into THE lungs 2 TIMES DAILY What changed:   how much to take  how to take this  when to take this  additional instructions   Cholecalciferol 25 MCG (1000 UT) tablet Take 1 tablet (1,000 Units total) by mouth daily.   collagenase ointment Commonly known as: SANTYL Apply topically daily.   cyclobenzaprine 5 MG tablet Commonly known as: FLEXERIL TAKE 1 TABLET BY MOUTH 3 TIMES DAILY AS NEEDED FOR MUSCLE SPASMS What changed: See the new instructions.   Ensure Max Protein Liqd Take 330 mLs (11 oz total) by mouth daily.   nutrition supplement (JUVEN) Pack Take 1 packet by mouth 2 (two) times daily between meals.   gabapentin 600 MG tablet Commonly known as:  NEURONTIN TAKE 2 TABLETS BY MOUTH 3 TIMES DAILY What changed: See the new instructions.   glucose blood test strip Commonly known as: Accu-Chek Guide USE T0 CHECK BLOOD SUGAR 3 TIMES DAILY   HYDROcodone-acetaminophen 10-325 MG tablet Commonly known as: NORCO Take 1 tablet by mouth every 8 (eight) hours as needed. What changed:   when to take this  Another medication with the same name was removed. Continue taking this medication, and follow the directions you see here.   insulin glargine 100 UNIT/ML injection Commonly known as: LANTUS Inject 0.4 mLs (40 Units total) into the skin daily.   Insulin Isophane & Regular Human (70-30) 100 UNIT/ML PEN Commonly known as: HUMULIN 70/30 MIX Inject 70 Units into the skin daily with breakfast AND 35 Units daily with supper.   lactobacillus acidophilus Tabs tablet Take 2 tablets by mouth 3 (three) times daily.   multivitamin with minerals Tabs tablet Take 1 tablet by mouth daily.   nystatin cream Commonly known as: MYCOSTATIN Apply topically 2 (two) times daily. Replaces: nystatin powder   Oxycodone HCl 10 MG Tabs Take 1 tablet (10 mg total) by mouth 2 (two) times daily as needed (Wound dressing Changes).   pantoprazole 40 MG tablet Commonly known as: PROTONIX Take 1 tablet (40 mg total) by mouth daily.   promethazine 12.5 MG tablet Commonly known as: PHENERGAN Take 1 tablet (12.5 mg total) by mouth every 6 (six) hours as needed for nausea or vomiting.   Proventil  HFA 108 (90 Base) MCG/ACT inhaler Generic drug: albuterol Inhale 1-2 puffs into the lungs every 6 (six) hours as needed for wheezing or shortness of breath. What changed: See the new instructions.   psyllium 95 % Pack Commonly known as: HYDROCIL/METAMUCIL Take 1 packet by mouth daily.       Discharge Assessment: Vitals:   05/25/19 0503 05/25/19 1227  BP: (!) 155/81 136/85  Pulse: 89 87  Resp: 18 18  Temp: 98.2 F (36.8 C) 98.7 F (37.1 C)  SpO2: 96%  92%  Sacral wound dressing changed before discharge.  IV catheter discontinued intact. Site without signs and symptoms of complications - no redness or edema noted at insertion site, patient denies c/o pain - only slight tenderness at site.  Dressing with slight pressure applied.  D/c Instructions-Education: Discharge instructions given to patient/family with verbalized understanding. D/c education completed with patient/family including follow up instructions, medication list, d/c activities limitations if indicated, with other d/c instructions as indicated by MD - patient able to verbalize understanding, all questions fully answered. Patient instructed to return to ED, call 911, or call MD for any changes in condition.  Patient escorted via [place], and D/C home via private auto.  Tresa Endo, RN 05/25/2019 4:02 PM

## 2019-05-25 NOTE — Progress Notes (Signed)
7 Days Post-Op    CC: Buttocks abscess/pannus abscess  Subjective: On CPAP this morning we first went in.  She does not note any significant change in her discomfort level.    Wounds seen with hydrotherapy.   Objective: Vital signs in last 24 hours: Temp:  [97.7 F (36.5 C)-98.2 F (36.8 C)] 98.2 F (36.8 C) (06/19 0503) Pulse Rate:  [82-89] 89 (06/19 0503) Resp:  [18] 18 (06/19 0503) BP: (103-155)/(66-95) 155/81 (06/19 0503) SpO2:  [96 %-100 %] 96 % (06/19 0503) Weight:  [134.4 kg] 134.4 kg (06/19 0500) Last BM Date: 05/23/19  Intake/Output from previous day: 06/18 0701 - 06/19 0700 In: 240 [P.O.:240] Out: 1150 [Urine:1150] Intake/Output this shift: Total I/O In: -  Out: 450 [Urine:450]  GEN: alert, NAD Resp: CPAP off, normal effort of breathing GI: obese, soft, irritation in folds and around pannus wound, some fibrinous necrotic tissue present  GU: perineal wound with small amount stool and some soupy drainage, surrounding erythema and skin irritation  Ext: AKA on the LLE, 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  Recent Labs  Lab 05/21/19 1604  ALBUMIN 1.2*     Lipase     Component Value Date/Time   LIPASE 24 05/08/2019 1927     Medications: . acetaminophen  650 mg Oral Q4H  . amLODipine  10 mg Oral Daily  . atorvastatin  20 mg Oral Daily  . Chlorhexidine Gluconate Cloth  6 each Topical Q0600  . heparin  5,000 Units Subcutaneous Q8H  . insulin aspart  0-20 Units Subcutaneous TID WC  . insulin aspart  0-5 Units Subcutaneous QHS  . insulin glargine  40 Units Subcutaneous  Daily  . lactobacillus acidophilus  2 tablet Oral TID  . mometasone-formoterol  2 puff Inhalation BID  . multivitamin with minerals  1 tablet Oral Daily  . nutrition supplement (JUVEN)  1 packet Oral BID BM  . nystatin cream   Topical BID  . Ensure Max Protein  11 oz Oral Daily  . saccharomyces boulardii  250 mg Oral BID  . sodium bicarbonate  1,300 mg Oral BID  . sodium chloride flush  10-40 mL Intracatheter Q12H    Assessment/Plan Acute renal failure Morbid obesity BMI 62.5 Insulin-dependent diabetes-poor control Left BKA/right trans-met amputation/diabetic neuropathy Obesity hypoventilation syndrome Anemia Severe protein calorie malnutrition COVID negative  Abscess pannus left lower abdominal wall; left gluteal abscess  1. I&D of left buttocks abscess involving skin and subcutaneous tissue and muscle(15 x 10 x 5)05/17/2019 Dr. Donnie Mesa.  POD #6 2.Incision and drainage of left lower abdominal pannus abscess(skin and subcutaneous tissue 6 x 3 x 2 cm)further debridement of left buttocks abscess(skin and subcutaneous tissue 10 x 4 x 4 cm)Dr. Rodman Key Tsuei,05/18/2019 POD #5  - Candidiasis of the buttocks, perineum and thighs. -Adding Diflucan x2 days, nystatin cream twice          daily FEN: IV fluids/carb modified diet ID: Clindamycin 6/12- 05/20/19;Zosyn 6/13 - 05/20/19; Ancef 6/14-6/16/20; Keflex  6/16 >> day 2;doxycycline 6/13 >> IV switched to oral 6/16 >> day 4  Follow-up: wound care center - pt prefers to go to [facility] care over [facility] DVT: None-she can have chemical DVT prophylaxis from our standpoint   [place]  [name], [name] Mother   [phone]   Plan: Patient reportedly going home today. Would recommend continuing probiotic and fiber to try to bulk stool and decrease contamination of perineal wound. Recommend santyl for necrotic ring of tissue in pannus wound. Will need at least BID dressing changes to both wounds  or more often if soiled. Will need home health RN - saline wet to dry dressing to perineal and pannus wounds, santyl to any areas of fibrinous necrotic tissue. Recommend follow up in the wound care center.     LOS: 8 days    Brigid Re , [facility] Surgery 05/25/2019, 11:33 AM Pager: [phone]

## 2019-05-25 NOTE — Progress Notes (Signed)
PICC line removed per order. PICC line intact. Vaseline gauze/gauze dressing applied and is clean, dry, and intact. Patient and NT aware patient on bedrest for 30 minutes until 1505. Patient aware to leave dressing on and dry for 24 hours.

## 2019-05-25 NOTE — Significant Event (Addendum)
For 6/19 0700 to 1900, please page [phone]  Pager [phone] is not working.  Brad Thompson, MD, MS FAMILY MEDICINE RESIDENT - PGY2 05/25/2019 7:42 AM 

## 2019-05-25 NOTE — Progress Notes (Signed)
Physical Therapy Wound Treatment Patient Details  Name: [name] MRN: [id number] Date of Birth: Mar 19, 1976  Today's Date: 05/25/2019 Time: 5462-7035 Time Calculation (min): 55 min  Subjective  Subjective: pt agreeable to hydro Patient and Family Stated Goals: none stated Date of Onset: ("several weeks ago," per pt) Prior Treatments: s/p I&D 6/11, 6/12  Pain Score:  ~ 6/10 with debridement, premedicated  Wound Assessment  Wound / Incision (Open or Dehisced) 05/19/19 Buttocks Left (Active)  Wound Image   05/23/19 1646  Dressing Type Gauze (Comment);ABD;Barrier Film (skin prep);Moist to dry 05/25/19 1452  Dressing Changed Changed 05/25/19 0145  Dressing Status Clean;Dry;Intact 05/25/19 1452  Dressing Change Frequency Daily 05/25/19 1452  Site / Wound Assessment Granulation tissue;Painful;Pale;Pink;Red;Yellow 05/25/19 1452  % Wound base Red or Granulating 60% 05/25/19 1452  % Wound base Yellow/Fibrinous Exudate 40% 05/25/19 1452  % Wound base Black/Eschar 0% 05/25/19 1452  % Wound base Other/Granulation Tissue (Comment) 0% 05/25/19 1452  Peri-wound Assessment Edema;Pink;Excoriated;Other (Comment) 05/25/19 1452  Wound Length (cm) 9.5 cm 05/19/19 1600  Wound Width (cm) 6 cm 05/19/19 1600  Wound Depth (cm) 5 cm 05/19/19 1600  Wound Volume (cm^3) 285 cm^3 05/19/19 1600  Wound Surface Area (cm^2) 57 cm^2 05/19/19 1600  Tunneling (cm) no 05/24/19 0001  Margins Unattached edges (unapproximated) 05/25/19 1452  Drainage Amount Minimal 05/25/19 1452  Drainage Description Serosanguineous 05/25/19 1452  Non-staged Wound Description Partial thickness 05/23/19 0900  Treatment Cleansed;Debridement (Selective);Hydrotherapy (Pulse lavage);Packing (Saline gauze) 05/25/19 1452     Wound / Incision (Open or Dehisced) 05/21/19 Non-pressure wound Abdomen Left (Active)  Wound Image   05/23/19 1646  Dressing Type ABD;Barrier Film (skin prep);Gauze (Comment);Moist to dry 05/25/19 1452  Dressing  Changed Changed 05/25/19 1452  Dressing Status Clean;Dry;Intact 05/25/19 1452  Dressing Change Frequency Daily 05/25/19 1452  Site / Wound Assessment Pink;Yellow 05/25/19 1452  % Wound base Red or Granulating 85% 05/25/19 1452  % Wound base Yellow/Fibrinous Exudate 15% 05/25/19 1452  % Wound base Black/Eschar 0% 05/25/19 1452  Peri-wound Assessment Intact;Erythema (blanchable) 05/25/19 1452  Wound Length (cm) 5 cm 05/21/19 1100  Wound Width (cm) 7 cm 05/21/19 1100  Wound Depth (cm) 3.4 cm 05/21/19 1100  Wound Volume (cm^3) 119 cm^3 05/21/19 1100  Wound Surface Area (cm^2) 35 cm^2 05/21/19 1100  Margins Unattached edges (unapproximated) 05/25/19 1452  Closure None 05/25/19 1452  Drainage Amount Minimal 05/25/19 1452  Drainage Description Serosanguineous 05/25/19 1452  Treatment Cleansed;Debridement (Selective);Hydrotherapy (Pulse lavage);Other (Comment) 05/25/19 1452      Hydrotherapy Pulsed lavage therapy - wound location: L buttocks, Pannus fold Pulsed Lavage with Suction (psi): 4 psi(to 12) Pulsed Lavage with Suction - Normal Saline Used: 1000 mL Pulsed Lavage Tip: Tip with splash shield Selective Debridement Selective Debridement - Location: L buttocks, Pannus fold Selective Debridement - Tools Used: Forceps;Scissors Selective Debridement - Tissue Removed: Yellow and black necrotic tissue   Wound Assessment and Plan  Wound Therapy - Assess/Plan/Recommendations Wound Therapy - Clinical Statement: With an appropriate amount of pain meds, this treatment was able to be completed. This patient will benefit from continued hydrotherapy for selective removal of unviable tissue, to decrease bioburden and promote wound bed healing.  Wound Therapy - Functional Problem List: decreased mobility Factors Delaying/Impairing Wound Healing: Diabetes Mellitus;Other (comment)(stool constantly in the wound) Hydrotherapy Plan: Debridement;Dressing change;Patient/family education;Pulsatile lavage with  suction Wound Therapy - Frequency: 6X / week Wound Therapy - Follow Up Recommendations: Other (comment);Home health RN(pt refusing SNF, going home) Wound Plan: see above  Wound Therapy  Goals- Improve the function of patient's integumentary system by progressing the wound(s) through the phases of wound healing (inflammation - proliferation - remodeling) by: Decrease Necrotic Tissue to: 30 Decrease Necrotic Tissue - Progress: Progressing toward goal Increase Granulation Tissue to: 70 Increase Granulation Tissue - Progress: Progressing toward goal Goals/treatment plan/discharge plan were made with and agreed upon by patient/family: Yes Time For Goal Achievement: 7 days Wound Therapy - Potential for Goals: Good  Goals will be updated until maximal potential achieved or discharge criteria met.  Discharge criteria: when goals achieved, discharge from hospital, MD decision/surgical intervention, no progress towards goals, refusal/missing three consecutive treatments without notification or medical reason.  GP     [name] [name] 05/25/2019, 2:56 PM  05/25/2019  Donnella Sham, PT Acute Rehabilitation Services [phone]  (pager) [phone]  (office)

## 2019-05-25 NOTE — Progress Notes (Addendum)
Family Medicine Teaching Service Daily Progress Note Intern Pager: [phone]  Patient name: [name] Medical record number: [id number] Date of birth: 06-12-1976 Age: 43 y.o. Gender: female  Primary Care Provider: [name], MD Consultants: General Surgery  Code Status: Full  Pt Overview and Major Events to Date:  6/11 Admitted to [facility], I&D Left Buttocks Abscess 6/11 - I+D in OR of gluteal abscess 6/12 - I+D in OR of Gluteal and pannus abscess  Antibiotics: - s/p zosyn (6/11- 6/14) - s/p clindamycin (6/12- 6/14)  - s/p vanc (6/11 - 6/12) - cefazolin (6/14-6/18) - doxycycline (6/14-6/18)  Assessment and Plan: [name] is a 43 y.o. female presenting with left buttock and pannus abscess and severely elevated blood glucose. PMH is significant for left BKA, amputation all 5 digits right foot, type 2 diabetes with neuropathy, allergic rhinitis, morbid obesity, GERD, right carpal tunnel syndrome, left hand osteoarthritis, obesity hypoventilation syndrome   Left buttock abscess 3 days S/P I&D and debridement  Abdominal Panus abscess 2 days s/p I&D and debridement Wound culture abundant GPR and GPC. BCx NG3D.  Blood cultures show no growth at 5 days.  Afebrile overnight with stable vitals.  Has now completed her antibiotic course.  Preference from the primary team is for patient to go to SNF for rehab and close monitoring to avoid recurrent infection of her large open wounds.  Patient's preference is to return home in order to maintain her disability check. - surgery following, appreciate recs - f/u wound cultures - f/u blood cultures: - s/p cefazolin (6/14-6/18) - s/p doxycycline (6/14-6/18) - Diflucan, 2 days - Nystatin cream to buttocks perineum and thighs - Probiotic - Tylenol 650 every 4 hours scheduled - Oxycodone 10 mg every 4 hours as needed - OT SNF - PT SNF  AKI, improved AKI likely secondary to antibiotic use.  Improvement today.  Creatinine  3.25>2.79(6/15)>1.95(6/15)>1.5>1.18(6/17)>1.18(6/18)>1.09(6/19) - monitor UOP  Nausea/Emesis, improved Improved on current regimen. No QTc Prolongation - Phenergan PRN  Hyperglycemia: Improved Hgb A1c 17.5 on 6/11.  Home meds: insulin 70/30. Serum glucose 113-210 in the past 24 hours.  40 units Lantus given, 3 units aspart in the past 24 hours. - Lantus 40u QD - rSSI + QHS coverage - monitor CBGs   Hypertension Systolic pressures 597-416 in the past 24 hours, diastolic pressures 38-453. -Amlodipine 10 mg daily -Consider starting ACE/ARB in the outpatient setting following provement of kidney function.  Acute Encephalopathy, resolved - cont to monitor  Anemia, improved Hgb 7.7 >8.1>8.9(6/17)>9.3(6/18) - monitor Hgb  Morbid obesity BMI 62.  Limited physical activity given amputations. - nutrition consult as outpatient - consider obesity medicine consult as outpatient as well - could also consider adding Victoza to DM regimen for glucose control and weight loss benefit  Bilateral amputations Left BKA, all toes amputated from right foot.  These wound sites are clean dry and intact, well-healed. - no intervention needed  Obesity hypoventilation syndrome - offer CPAP QHS - outpatient sleep study  Asthma Well-controlled.  Takes Symbicort and albuterol as needed for symptoms.   [name] while inpatient (formulary)  Diabetic neuropathy Takes gabapentin 1200 mg 3 times daily.   -restart gabapentin  Elevated alk phos 291 on admission, this AM pending.  Likely 2/2 inflammation from large gluteal abscess. - could consider GGT or RUQ if suspicion arises for hepatic process   FEN/GI: carb modified  PPx: heparin  Disposition: Medically stable and ready for discharge   Subjective:  No acute events overnight.  No new  complaints this morning.  She reports that she would like to go home this afternoon when she can be picked up by her mother after  work.  Objective: Temp:  [97.7 F (36.5 C)-98.2 F (36.8 C)] 98.2 F (36.8 C) (06/19 0503) Pulse Rate:  [82-100] 89 (06/19 0503) Resp:  [18] 18 (06/19 0503) BP: (103-197)/(66-102) 155/81 (06/19 0503) SpO2:  [96 %-100 %] 96 % (06/19 0503) Weight:  [134.4 kg] 134.4 kg (06/19 0500)  Physical Exam:  General: Alert and cooperative and appears to be in no acute distress HEENT: Neck non-tender without lymphadenopathy, masses or thyromegaly Cardio: Normal S1 and S2, no S3 or S4. Rhythm is regular. No murmurs or rubs.   Pulm: Clear to auscultation bilaterally, no crackles, wheezing, or diminished breath sounds. Normal respiratory effort Abdomen: Bowel sounds normal. Abdomen soft and non-tender.  Extremities: No peripheral edema. Warm/ well perfused.  Strong radial pulse. Neuro: Cranial nerves grossly intact   Laboratory: Recent Labs  Lab 05/23/19 0418 05/24/19 0348 05/25/19 0431  WBC 12.6* 12.7* 13.0*  HGB 8.9* 9.3* 9.4*  HCT 28.6* 29.6* 30.1*  PLT 588* 632* 598*   Recent Labs  Lab 05/23/19 0418 05/24/19 0348 05/25/19 0431  NA 142 141 138  K 3.9 3.6 3.3*  CL 108 104 100  CO2 _0 BUN _1 CREATININE 1.18* 1.18* 1.09*  CALCIUM 8.9 8.8* 8.3*  GLUCOSE 124* 134* 210*   Imaging/Diagnostic Tests: No results found.  Belinda Haymaker, MD 05/25/2019, 6:06 AM PGY-1, [place] Intern pager: [phone], text pages welcome

## 2019-05-25 NOTE — Progress Notes (Signed)
Physical Therapy Treatment Patient Details Name: [name] MRN: [id number] DOB: [birth year] Today's Date: 05/25/2019    History of Present Illness Belinda Banksis a 43 y.o.femalepresenting with left buttock abscess and severely elevated blood glucose. PMH is significant forleft BKA, amputation all 5 digits right foot, type 2 diabetes with neuropathy, allergic rhinitis, morbid obesity, GERD, right carpal tunnel syndrome, left hand osteoarthritis, obesity hypoventilation syndrome    PT Comments    With the proper motivation the patient was able to sit up in bed, transfer to transfer chair and step up into a mid sized SUV. She reported pain stepping up in the SUV. The patient is confident she will be able to get into her wheelchair at home. She did not use nan assistance device just therapy and nursing assistance.    Follow Up Recommendations  SNF;Home health PT     Equipment Recommendations  None recommended by PT    Recommendations for Other Services Rehab consult     Precautions / Restrictions Precautions Precautions: Fall Restrictions Weight Bearing Restrictions: No    Mobility  Bed Mobility Overal bed mobility: Needs Assistance Bed Mobility: Rolling;Supine to Sit Rolling: Modified independent (Device/Increase time)   Supine to sit: Min assist     General bed mobility comments: Min a to the edge of the bed. Had to shift to get weight off her wound   Transfers Overall transfer level: Needs assistance Equipment used: 2 person hand held assist Transfers: Sit to/from Stand Sit to Stand: Mod assist;+2 physical assistance;+2 safety/equipment         General transfer comment: Mod a transfer to transport chair with prothetic on. Mod A to don prothetic. Mod a into car reported pain. patient is confident she can get out with her mothers assistance   Ambulation/Gait                 Stairs             Wheelchair Mobility    Modified Rankin (Stroke  Patients Only)       Balance Overall balance assessment: Needs assistance Sitting-balance support: Bilateral upper extremity supported Sitting balance-Leahy Scale: Poor Sitting balance - Comments: pt requires BUE while sitting EOB;able to tolerate sitting for about 60min secondary to pain, weight shifting from R<>L to relieve pressure on buttocks when directly upright   Standing balance support: Bilateral upper extremity supported Standing balance-Leahy Scale: Poor Standing balance comment: therapy and nursing assisted patient to stand                             Cognition Arousal/Alertness: Lethargic Behavior During Therapy: Agitated;WFL for tasks assessed/performed Overall Cognitive Status: Within Functional Limits for tasks assessed                                 General Comments: pt agitated upon arrival, pt highly motivated by daughter Bubba Hales. Pt required max encouragement to participate during session but agreeable when discussing continuing to 'participate during therapy to get stronger and get back home to her daughter'      Exercises      General Comments        Pertinent Vitals/Pain Pain Assessment: Faces Faces Pain Scale: Hurts whole lot Pain Location: left buttocks with transfers    Home Living  Prior Function            PT Goals (current goals can now be found in the care plan section) Acute Rehab PT Goals PT Goal Formulation: With patient Time For Goal Achievement: 05/28/19 Potential to Achieve Goals: Good Progress towards PT goals: Progressing toward goals    Frequency    Min 3X/week      PT Plan Current plan remains appropriate    Co-evaluation   Reason for Co-Treatment: Complexity of the patient's impairments (multi-system involvement)          AM-PAC PT "6 Clicks" Mobility   Outcome Measure  Help needed turning from your back to your side while in a flat bed without using  bedrails?: A Lot Help needed moving from lying on your back to sitting on the side of a flat bed without using bedrails?: A Lot Help needed moving to and from a bed to a chair (including a wheelchair)?: Total Help needed standing up from a chair using your arms (e.g., wheelchair or bedside chair)?: Total Help needed to walk in hospital room?: Total Help needed climbing 3-5 steps with a railing? : Total 6 Click Score: 8    End of Session Equipment Utilized During Treatment: Gait belt Activity Tolerance: Patient limited by pain Patient left: in bed;with call bell/phone within reach;with bed alarm set Nurse Communication: Mobility status PT Visit Diagnosis: Unsteadiness on feet (R26.81);Muscle weakness (generalized) (M62.81);Other abnormalities of gait and mobility (R26.89)     Time: 5498-2641 PT Time Calculation (min) (ACUTE ONLY): 32 min  Charges:  $Therapeutic Activity: 23-37 mins                       Carney Living PT DPT  05/25/2019, 4:12 PM

## 2019-05-25 NOTE — TOC Transition Note (Signed)
Transition of Care [facility]) - CM/SW Discharge Note   Patient Details  Name: [name] MRN: [id number] Date of Birth: 1976-09-28  Transition of Care [facility]) CM/SW Contact:  Sharin Mons, RN Phone Number: 05/25/2019, 3:35 PM   Clinical Narrative:    Admitted with L buttock abscess.       -s/p I&D 6/11,6/12 Transition to home today with home health services to follow, Noonday to begin on Sunday with [facility]. Pt states mom will assist with wound care once d/c. Pt declined SNF placement. Pt has f/u appointment scheduled @ the [facility], noted on AVS.  Pt states mom will provide transportation to home.  Final next level of care: Home w Home Health Services Barriers to Discharge: No Barriers Identified   Patient Goals and CMS Choice   CMS Medicare.gov Compare Post Acute Care list provided to:: Patient Choice offered to / list presented to : Patient  Discharge Placement                       Discharge Plan and Services In-house Referral: NA Discharge Planning Services: CM Consult                      HH Arranged: RN, PT, OT [facility] Agency: [facility] Date [facility] Agency Contacted: 05/25/19 Time [place]: [id number] Representative spoke with at [facility]: [place] ([place]) Interventions     Readmission Risk Interventions No flowsheet data found.

## 2019-05-26 DIAGNOSIS — E1140 Type 2 diabetes mellitus with diabetic neuropathy, unspecified: Secondary | ICD-10-CM | POA: Diagnosis not present

## 2019-05-26 LAB — NOVEL CORONAVIRUS, NAA (HOSP ORDER, SEND-OUT TO REF LAB; TAT 18-24 HRS): SARS-CoV-2, NAA: NOT DETECTED

## 2019-05-27 DIAGNOSIS — E1140 Type 2 diabetes mellitus with diabetic neuropathy, unspecified: Secondary | ICD-10-CM | POA: Diagnosis not present

## 2019-05-28 DIAGNOSIS — E785 Hyperlipidemia, unspecified: Secondary | ICD-10-CM | POA: Diagnosis not present

## 2019-05-28 DIAGNOSIS — T8789 Other complications of amputation stump: Secondary | ICD-10-CM | POA: Diagnosis not present

## 2019-05-28 DIAGNOSIS — G2581 Restless legs syndrome: Secondary | ICD-10-CM | POA: Diagnosis not present

## 2019-05-28 DIAGNOSIS — F3181 Bipolar II disorder: Secondary | ICD-10-CM | POA: Diagnosis not present

## 2019-05-28 DIAGNOSIS — Z44111 Encounter for fitting and adjustment of complete right artificial leg: Secondary | ICD-10-CM | POA: Diagnosis not present

## 2019-05-28 DIAGNOSIS — E1151 Type 2 diabetes mellitus with diabetic peripheral angiopathy without gangrene: Secondary | ICD-10-CM | POA: Diagnosis not present

## 2019-05-28 DIAGNOSIS — E559 Vitamin D deficiency, unspecified: Secondary | ICD-10-CM | POA: Diagnosis not present

## 2019-05-28 DIAGNOSIS — I10 Essential (primary) hypertension: Secondary | ICD-10-CM | POA: Diagnosis not present

## 2019-05-28 DIAGNOSIS — B372 Candidiasis of skin and nail: Secondary | ICD-10-CM | POA: Diagnosis not present

## 2019-05-28 DIAGNOSIS — E43 Unspecified severe protein-calorie malnutrition: Secondary | ICD-10-CM | POA: Diagnosis not present

## 2019-05-28 DIAGNOSIS — L02211 Cutaneous abscess of abdominal wall: Secondary | ICD-10-CM | POA: Diagnosis not present

## 2019-05-28 DIAGNOSIS — M1812 Unilateral primary osteoarthritis of first carpometacarpal joint, left hand: Secondary | ICD-10-CM | POA: Diagnosis not present

## 2019-05-28 DIAGNOSIS — K219 Gastro-esophageal reflux disease without esophagitis: Secondary | ICD-10-CM | POA: Diagnosis not present

## 2019-05-28 DIAGNOSIS — E1140 Type 2 diabetes mellitus with diabetic neuropathy, unspecified: Secondary | ICD-10-CM | POA: Diagnosis not present

## 2019-05-28 DIAGNOSIS — M1711 Unilateral primary osteoarthritis, right knee: Secondary | ICD-10-CM | POA: Diagnosis not present

## 2019-05-28 DIAGNOSIS — G4733 Obstructive sleep apnea (adult) (pediatric): Secondary | ICD-10-CM | POA: Diagnosis not present

## 2019-05-28 DIAGNOSIS — E1142 Type 2 diabetes mellitus with diabetic polyneuropathy: Secondary | ICD-10-CM | POA: Diagnosis not present

## 2019-05-28 DIAGNOSIS — G8929 Other chronic pain: Secondary | ICD-10-CM | POA: Diagnosis not present

## 2019-05-28 DIAGNOSIS — L89326 Pressure-induced deep tissue damage of left buttock: Secondary | ICD-10-CM | POA: Diagnosis not present

## 2019-05-28 DIAGNOSIS — D649 Anemia, unspecified: Secondary | ICD-10-CM | POA: Diagnosis not present

## 2019-05-28 DIAGNOSIS — Z44112 Encounter for fitting and adjustment of complete left artificial leg: Secondary | ICD-10-CM | POA: Diagnosis not present

## 2019-05-28 DIAGNOSIS — I83893 Varicose veins of bilateral lower extremities with other complications: Secondary | ICD-10-CM | POA: Diagnosis not present

## 2019-05-28 DIAGNOSIS — J329 Chronic sinusitis, unspecified: Secondary | ICD-10-CM | POA: Diagnosis not present

## 2019-05-28 DIAGNOSIS — M654 Radial styloid tenosynovitis [de Quervain]: Secondary | ICD-10-CM | POA: Diagnosis not present

## 2019-05-28 DIAGNOSIS — J449 Chronic obstructive pulmonary disease, unspecified: Secondary | ICD-10-CM | POA: Diagnosis not present

## 2019-05-29 ENCOUNTER — Encounter: Payer: Medicaid Other | Admitting: Physical Therapy

## 2019-05-29 ENCOUNTER — Telehealth: Payer: Self-pay | Admitting: Family Medicine

## 2019-05-29 DIAGNOSIS — G2581 Restless legs syndrome: Secondary | ICD-10-CM | POA: Diagnosis not present

## 2019-05-29 DIAGNOSIS — T8789 Other complications of amputation stump: Secondary | ICD-10-CM | POA: Diagnosis not present

## 2019-05-29 DIAGNOSIS — G8929 Other chronic pain: Secondary | ICD-10-CM | POA: Diagnosis not present

## 2019-05-29 DIAGNOSIS — E1140 Type 2 diabetes mellitus with diabetic neuropathy, unspecified: Secondary | ICD-10-CM | POA: Diagnosis not present

## 2019-05-29 DIAGNOSIS — I83893 Varicose veins of bilateral lower extremities with other complications: Secondary | ICD-10-CM | POA: Diagnosis not present

## 2019-05-29 DIAGNOSIS — I10 Essential (primary) hypertension: Secondary | ICD-10-CM | POA: Diagnosis not present

## 2019-05-29 DIAGNOSIS — M1812 Unilateral primary osteoarthritis of first carpometacarpal joint, left hand: Secondary | ICD-10-CM | POA: Diagnosis not present

## 2019-05-29 DIAGNOSIS — J329 Chronic sinusitis, unspecified: Secondary | ICD-10-CM | POA: Diagnosis not present

## 2019-05-29 DIAGNOSIS — D649 Anemia, unspecified: Secondary | ICD-10-CM | POA: Diagnosis not present

## 2019-05-29 DIAGNOSIS — G4733 Obstructive sleep apnea (adult) (pediatric): Secondary | ICD-10-CM | POA: Diagnosis not present

## 2019-05-29 DIAGNOSIS — M1711 Unilateral primary osteoarthritis, right knee: Secondary | ICD-10-CM | POA: Diagnosis not present

## 2019-05-29 DIAGNOSIS — F3181 Bipolar II disorder: Secondary | ICD-10-CM | POA: Diagnosis not present

## 2019-05-29 DIAGNOSIS — K219 Gastro-esophageal reflux disease without esophagitis: Secondary | ICD-10-CM | POA: Diagnosis not present

## 2019-05-29 DIAGNOSIS — E785 Hyperlipidemia, unspecified: Secondary | ICD-10-CM | POA: Diagnosis not present

## 2019-05-29 DIAGNOSIS — Z44111 Encounter for fitting and adjustment of complete right artificial leg: Secondary | ICD-10-CM | POA: Diagnosis not present

## 2019-05-29 DIAGNOSIS — E559 Vitamin D deficiency, unspecified: Secondary | ICD-10-CM | POA: Diagnosis not present

## 2019-05-29 DIAGNOSIS — L02211 Cutaneous abscess of abdominal wall: Secondary | ICD-10-CM | POA: Diagnosis not present

## 2019-05-29 DIAGNOSIS — E1151 Type 2 diabetes mellitus with diabetic peripheral angiopathy without gangrene: Secondary | ICD-10-CM | POA: Diagnosis not present

## 2019-05-29 DIAGNOSIS — E1142 Type 2 diabetes mellitus with diabetic polyneuropathy: Secondary | ICD-10-CM | POA: Diagnosis not present

## 2019-05-29 DIAGNOSIS — L89326 Pressure-induced deep tissue damage of left buttock: Secondary | ICD-10-CM | POA: Diagnosis not present

## 2019-05-29 DIAGNOSIS — B372 Candidiasis of skin and nail: Secondary | ICD-10-CM | POA: Diagnosis not present

## 2019-05-29 DIAGNOSIS — M654 Radial styloid tenosynovitis [de Quervain]: Secondary | ICD-10-CM | POA: Diagnosis not present

## 2019-05-29 DIAGNOSIS — J449 Chronic obstructive pulmonary disease, unspecified: Secondary | ICD-10-CM | POA: Diagnosis not present

## 2019-05-29 DIAGNOSIS — Z44112 Encounter for fitting and adjustment of complete left artificial leg: Secondary | ICD-10-CM | POA: Diagnosis not present

## 2019-05-29 DIAGNOSIS — E43 Unspecified severe protein-calorie malnutrition: Secondary | ICD-10-CM | POA: Diagnosis not present

## 2019-05-29 NOTE — Telephone Encounter (Signed)
Gave verbal orders via voice mail to [place] at [facility].  [name], [place]

## 2019-05-29 NOTE — Telephone Encounter (Signed)
Tillie Rung from [facility] home [facility] is calling for verbal orders.   Home health PT once a week x 4 weeks  Strengthening  Prosthetic training Transfer training  Best call back number is [phone]

## 2019-05-29 NOTE — Telephone Encounter (Signed)
Please authorize these orders, thanks! Aella Ronda J Bambi Fehnel, MD  

## 2019-05-30 ENCOUNTER — Encounter: Payer: Medicaid Other | Admitting: Physical Therapy

## 2019-05-30 ENCOUNTER — Encounter (HOSPITAL_BASED_OUTPATIENT_CLINIC_OR_DEPARTMENT_OTHER): Payer: Medicaid Other | Attending: Physician Assistant

## 2019-05-30 ENCOUNTER — Other Ambulatory Visit: Payer: Self-pay

## 2019-05-30 DIAGNOSIS — Z794 Long term (current) use of insulin: Secondary | ICD-10-CM | POA: Diagnosis not present

## 2019-05-30 DIAGNOSIS — L02211 Cutaneous abscess of abdominal wall: Secondary | ICD-10-CM | POA: Diagnosis not present

## 2019-05-30 DIAGNOSIS — J449 Chronic obstructive pulmonary disease, unspecified: Secondary | ICD-10-CM | POA: Insufficient documentation

## 2019-05-30 DIAGNOSIS — L0231 Cutaneous abscess of buttock: Secondary | ICD-10-CM | POA: Diagnosis not present

## 2019-05-30 DIAGNOSIS — L89894 Pressure ulcer of other site, stage 4: Secondary | ICD-10-CM | POA: Insufficient documentation

## 2019-05-30 DIAGNOSIS — Z87891 Personal history of nicotine dependence: Secondary | ICD-10-CM | POA: Diagnosis not present

## 2019-05-30 DIAGNOSIS — L98413 Non-pressure chronic ulcer of buttock with necrosis of muscle: Secondary | ICD-10-CM | POA: Insufficient documentation

## 2019-05-30 DIAGNOSIS — Z89431 Acquired absence of right foot: Secondary | ICD-10-CM | POA: Insufficient documentation

## 2019-05-30 DIAGNOSIS — L98415 Non-pressure chronic ulcer of buttock with muscle involvement without evidence of necrosis: Secondary | ICD-10-CM | POA: Diagnosis not present

## 2019-05-30 DIAGNOSIS — L98495 Non-pressure chronic ulcer of skin of other sites with muscle involvement without evidence of necrosis: Secondary | ICD-10-CM | POA: Diagnosis not present

## 2019-05-30 DIAGNOSIS — I10 Essential (primary) hypertension: Secondary | ICD-10-CM | POA: Diagnosis not present

## 2019-05-30 DIAGNOSIS — E1140 Type 2 diabetes mellitus with diabetic neuropathy, unspecified: Secondary | ICD-10-CM | POA: Insufficient documentation

## 2019-05-30 DIAGNOSIS — Z6841 Body Mass Index (BMI) 40.0 and over, adult: Secondary | ICD-10-CM | POA: Diagnosis not present

## 2019-05-31 DIAGNOSIS — E1140 Type 2 diabetes mellitus with diabetic neuropathy, unspecified: Secondary | ICD-10-CM | POA: Diagnosis not present

## 2019-06-01 ENCOUNTER — Ambulatory Visit: Payer: Medicaid Other | Admitting: Family Medicine

## 2019-06-01 DIAGNOSIS — E1140 Type 2 diabetes mellitus with diabetic neuropathy, unspecified: Secondary | ICD-10-CM | POA: Diagnosis not present

## 2019-06-02 DIAGNOSIS — E1140 Type 2 diabetes mellitus with diabetic neuropathy, unspecified: Secondary | ICD-10-CM | POA: Diagnosis not present

## 2019-06-03 DIAGNOSIS — E1140 Type 2 diabetes mellitus with diabetic neuropathy, unspecified: Secondary | ICD-10-CM | POA: Diagnosis not present

## 2019-06-04 DIAGNOSIS — J449 Chronic obstructive pulmonary disease, unspecified: Secondary | ICD-10-CM | POA: Diagnosis not present

## 2019-06-04 DIAGNOSIS — E1142 Type 2 diabetes mellitus with diabetic polyneuropathy: Secondary | ICD-10-CM | POA: Diagnosis not present

## 2019-06-04 DIAGNOSIS — D649 Anemia, unspecified: Secondary | ICD-10-CM | POA: Diagnosis not present

## 2019-06-04 DIAGNOSIS — L89326 Pressure-induced deep tissue damage of left buttock: Secondary | ICD-10-CM | POA: Diagnosis not present

## 2019-06-04 DIAGNOSIS — Z44111 Encounter for fitting and adjustment of complete right artificial leg: Secondary | ICD-10-CM | POA: Diagnosis not present

## 2019-06-04 DIAGNOSIS — F3181 Bipolar II disorder: Secondary | ICD-10-CM | POA: Diagnosis not present

## 2019-06-04 DIAGNOSIS — E1140 Type 2 diabetes mellitus with diabetic neuropathy, unspecified: Secondary | ICD-10-CM | POA: Diagnosis not present

## 2019-06-04 DIAGNOSIS — Z44112 Encounter for fitting and adjustment of complete left artificial leg: Secondary | ICD-10-CM | POA: Diagnosis not present

## 2019-06-04 DIAGNOSIS — G8929 Other chronic pain: Secondary | ICD-10-CM | POA: Diagnosis not present

## 2019-06-04 DIAGNOSIS — E43 Unspecified severe protein-calorie malnutrition: Secondary | ICD-10-CM | POA: Diagnosis not present

## 2019-06-04 DIAGNOSIS — G4733 Obstructive sleep apnea (adult) (pediatric): Secondary | ICD-10-CM | POA: Diagnosis not present

## 2019-06-04 DIAGNOSIS — M1812 Unilateral primary osteoarthritis of first carpometacarpal joint, left hand: Secondary | ICD-10-CM | POA: Diagnosis not present

## 2019-06-04 DIAGNOSIS — M1711 Unilateral primary osteoarthritis, right knee: Secondary | ICD-10-CM | POA: Diagnosis not present

## 2019-06-04 DIAGNOSIS — L02211 Cutaneous abscess of abdominal wall: Secondary | ICD-10-CM | POA: Diagnosis not present

## 2019-06-04 DIAGNOSIS — E785 Hyperlipidemia, unspecified: Secondary | ICD-10-CM | POA: Diagnosis not present

## 2019-06-04 DIAGNOSIS — B372 Candidiasis of skin and nail: Secondary | ICD-10-CM | POA: Diagnosis not present

## 2019-06-04 DIAGNOSIS — T8789 Other complications of amputation stump: Secondary | ICD-10-CM | POA: Diagnosis not present

## 2019-06-04 DIAGNOSIS — K219 Gastro-esophageal reflux disease without esophagitis: Secondary | ICD-10-CM | POA: Diagnosis not present

## 2019-06-04 DIAGNOSIS — E559 Vitamin D deficiency, unspecified: Secondary | ICD-10-CM | POA: Diagnosis not present

## 2019-06-04 DIAGNOSIS — I83893 Varicose veins of bilateral lower extremities with other complications: Secondary | ICD-10-CM | POA: Diagnosis not present

## 2019-06-04 DIAGNOSIS — I10 Essential (primary) hypertension: Secondary | ICD-10-CM | POA: Diagnosis not present

## 2019-06-04 DIAGNOSIS — M654 Radial styloid tenosynovitis [de Quervain]: Secondary | ICD-10-CM | POA: Diagnosis not present

## 2019-06-04 DIAGNOSIS — G2581 Restless legs syndrome: Secondary | ICD-10-CM | POA: Diagnosis not present

## 2019-06-04 DIAGNOSIS — J329 Chronic sinusitis, unspecified: Secondary | ICD-10-CM | POA: Diagnosis not present

## 2019-06-04 DIAGNOSIS — E1151 Type 2 diabetes mellitus with diabetic peripheral angiopathy without gangrene: Secondary | ICD-10-CM | POA: Diagnosis not present

## 2019-06-05 ENCOUNTER — Encounter: Payer: Medicaid Other | Admitting: Physical Therapy

## 2019-06-05 DIAGNOSIS — E1140 Type 2 diabetes mellitus with diabetic neuropathy, unspecified: Secondary | ICD-10-CM | POA: Diagnosis not present

## 2019-06-06 DIAGNOSIS — L89326 Pressure-induced deep tissue damage of left buttock: Secondary | ICD-10-CM | POA: Diagnosis not present

## 2019-06-06 DIAGNOSIS — E559 Vitamin D deficiency, unspecified: Secondary | ICD-10-CM | POA: Diagnosis not present

## 2019-06-06 DIAGNOSIS — B372 Candidiasis of skin and nail: Secondary | ICD-10-CM | POA: Diagnosis not present

## 2019-06-06 DIAGNOSIS — F3181 Bipolar II disorder: Secondary | ICD-10-CM | POA: Diagnosis not present

## 2019-06-06 DIAGNOSIS — L02211 Cutaneous abscess of abdominal wall: Secondary | ICD-10-CM | POA: Diagnosis not present

## 2019-06-06 DIAGNOSIS — E785 Hyperlipidemia, unspecified: Secondary | ICD-10-CM | POA: Diagnosis not present

## 2019-06-06 DIAGNOSIS — J449 Chronic obstructive pulmonary disease, unspecified: Secondary | ICD-10-CM | POA: Diagnosis not present

## 2019-06-06 DIAGNOSIS — E43 Unspecified severe protein-calorie malnutrition: Secondary | ICD-10-CM | POA: Diagnosis not present

## 2019-06-06 DIAGNOSIS — M1812 Unilateral primary osteoarthritis of first carpometacarpal joint, left hand: Secondary | ICD-10-CM | POA: Diagnosis not present

## 2019-06-06 DIAGNOSIS — T8789 Other complications of amputation stump: Secondary | ICD-10-CM | POA: Diagnosis not present

## 2019-06-06 DIAGNOSIS — I10 Essential (primary) hypertension: Secondary | ICD-10-CM | POA: Diagnosis not present

## 2019-06-06 DIAGNOSIS — J329 Chronic sinusitis, unspecified: Secondary | ICD-10-CM | POA: Diagnosis not present

## 2019-06-06 DIAGNOSIS — E1140 Type 2 diabetes mellitus with diabetic neuropathy, unspecified: Secondary | ICD-10-CM | POA: Diagnosis not present

## 2019-06-06 DIAGNOSIS — M1711 Unilateral primary osteoarthritis, right knee: Secondary | ICD-10-CM | POA: Diagnosis not present

## 2019-06-06 DIAGNOSIS — G4733 Obstructive sleep apnea (adult) (pediatric): Secondary | ICD-10-CM | POA: Diagnosis not present

## 2019-06-06 DIAGNOSIS — E1151 Type 2 diabetes mellitus with diabetic peripheral angiopathy without gangrene: Secondary | ICD-10-CM | POA: Diagnosis not present

## 2019-06-06 DIAGNOSIS — G8929 Other chronic pain: Secondary | ICD-10-CM | POA: Diagnosis not present

## 2019-06-06 DIAGNOSIS — I83893 Varicose veins of bilateral lower extremities with other complications: Secondary | ICD-10-CM | POA: Diagnosis not present

## 2019-06-06 DIAGNOSIS — K219 Gastro-esophageal reflux disease without esophagitis: Secondary | ICD-10-CM | POA: Diagnosis not present

## 2019-06-06 DIAGNOSIS — G2581 Restless legs syndrome: Secondary | ICD-10-CM | POA: Diagnosis not present

## 2019-06-06 DIAGNOSIS — D649 Anemia, unspecified: Secondary | ICD-10-CM | POA: Diagnosis not present

## 2019-06-06 DIAGNOSIS — Z44112 Encounter for fitting and adjustment of complete left artificial leg: Secondary | ICD-10-CM | POA: Diagnosis not present

## 2019-06-06 DIAGNOSIS — E1142 Type 2 diabetes mellitus with diabetic polyneuropathy: Secondary | ICD-10-CM | POA: Diagnosis not present

## 2019-06-06 DIAGNOSIS — Z44111 Encounter for fitting and adjustment of complete right artificial leg: Secondary | ICD-10-CM | POA: Diagnosis not present

## 2019-06-06 DIAGNOSIS — M654 Radial styloid tenosynovitis [de Quervain]: Secondary | ICD-10-CM | POA: Diagnosis not present

## 2019-06-07 ENCOUNTER — Encounter: Payer: Medicaid Other | Admitting: Physical Therapy

## 2019-06-07 DIAGNOSIS — E1140 Type 2 diabetes mellitus with diabetic neuropathy, unspecified: Secondary | ICD-10-CM | POA: Diagnosis not present

## 2019-06-08 DIAGNOSIS — Z44112 Encounter for fitting and adjustment of complete left artificial leg: Secondary | ICD-10-CM | POA: Diagnosis not present

## 2019-06-08 DIAGNOSIS — E785 Hyperlipidemia, unspecified: Secondary | ICD-10-CM | POA: Diagnosis not present

## 2019-06-08 DIAGNOSIS — M654 Radial styloid tenosynovitis [de Quervain]: Secondary | ICD-10-CM | POA: Diagnosis not present

## 2019-06-08 DIAGNOSIS — I10 Essential (primary) hypertension: Secondary | ICD-10-CM | POA: Diagnosis not present

## 2019-06-08 DIAGNOSIS — G8929 Other chronic pain: Secondary | ICD-10-CM | POA: Diagnosis not present

## 2019-06-08 DIAGNOSIS — B372 Candidiasis of skin and nail: Secondary | ICD-10-CM | POA: Diagnosis not present

## 2019-06-08 DIAGNOSIS — M1711 Unilateral primary osteoarthritis, right knee: Secondary | ICD-10-CM | POA: Diagnosis not present

## 2019-06-08 DIAGNOSIS — E1140 Type 2 diabetes mellitus with diabetic neuropathy, unspecified: Secondary | ICD-10-CM | POA: Diagnosis not present

## 2019-06-08 DIAGNOSIS — J329 Chronic sinusitis, unspecified: Secondary | ICD-10-CM | POA: Diagnosis not present

## 2019-06-08 DIAGNOSIS — E1142 Type 2 diabetes mellitus with diabetic polyneuropathy: Secondary | ICD-10-CM | POA: Diagnosis not present

## 2019-06-08 DIAGNOSIS — G4733 Obstructive sleep apnea (adult) (pediatric): Secondary | ICD-10-CM | POA: Diagnosis not present

## 2019-06-08 DIAGNOSIS — T8789 Other complications of amputation stump: Secondary | ICD-10-CM | POA: Diagnosis not present

## 2019-06-08 DIAGNOSIS — E559 Vitamin D deficiency, unspecified: Secondary | ICD-10-CM | POA: Diagnosis not present

## 2019-06-08 DIAGNOSIS — J449 Chronic obstructive pulmonary disease, unspecified: Secondary | ICD-10-CM | POA: Diagnosis not present

## 2019-06-08 DIAGNOSIS — D649 Anemia, unspecified: Secondary | ICD-10-CM | POA: Diagnosis not present

## 2019-06-08 DIAGNOSIS — M1812 Unilateral primary osteoarthritis of first carpometacarpal joint, left hand: Secondary | ICD-10-CM | POA: Diagnosis not present

## 2019-06-08 DIAGNOSIS — Z44111 Encounter for fitting and adjustment of complete right artificial leg: Secondary | ICD-10-CM | POA: Diagnosis not present

## 2019-06-08 DIAGNOSIS — L89326 Pressure-induced deep tissue damage of left buttock: Secondary | ICD-10-CM | POA: Diagnosis not present

## 2019-06-08 DIAGNOSIS — G2581 Restless legs syndrome: Secondary | ICD-10-CM | POA: Diagnosis not present

## 2019-06-08 DIAGNOSIS — I83893 Varicose veins of bilateral lower extremities with other complications: Secondary | ICD-10-CM | POA: Diagnosis not present

## 2019-06-08 DIAGNOSIS — E1151 Type 2 diabetes mellitus with diabetic peripheral angiopathy without gangrene: Secondary | ICD-10-CM | POA: Diagnosis not present

## 2019-06-08 DIAGNOSIS — L02211 Cutaneous abscess of abdominal wall: Secondary | ICD-10-CM | POA: Diagnosis not present

## 2019-06-08 DIAGNOSIS — F3181 Bipolar II disorder: Secondary | ICD-10-CM | POA: Diagnosis not present

## 2019-06-08 DIAGNOSIS — E43 Unspecified severe protein-calorie malnutrition: Secondary | ICD-10-CM | POA: Diagnosis not present

## 2019-06-08 DIAGNOSIS — K219 Gastro-esophageal reflux disease without esophagitis: Secondary | ICD-10-CM | POA: Diagnosis not present

## 2019-06-10 DIAGNOSIS — E1140 Type 2 diabetes mellitus with diabetic neuropathy, unspecified: Secondary | ICD-10-CM | POA: Diagnosis not present

## 2019-06-11 DIAGNOSIS — M1711 Unilateral primary osteoarthritis, right knee: Secondary | ICD-10-CM | POA: Diagnosis not present

## 2019-06-11 DIAGNOSIS — Z44111 Encounter for fitting and adjustment of complete right artificial leg: Secondary | ICD-10-CM | POA: Diagnosis not present

## 2019-06-11 DIAGNOSIS — I83893 Varicose veins of bilateral lower extremities with other complications: Secondary | ICD-10-CM | POA: Diagnosis not present

## 2019-06-11 DIAGNOSIS — M1812 Unilateral primary osteoarthritis of first carpometacarpal joint, left hand: Secondary | ICD-10-CM | POA: Diagnosis not present

## 2019-06-11 DIAGNOSIS — L02211 Cutaneous abscess of abdominal wall: Secondary | ICD-10-CM | POA: Diagnosis not present

## 2019-06-11 DIAGNOSIS — E43 Unspecified severe protein-calorie malnutrition: Secondary | ICD-10-CM | POA: Diagnosis not present

## 2019-06-11 DIAGNOSIS — E559 Vitamin D deficiency, unspecified: Secondary | ICD-10-CM | POA: Diagnosis not present

## 2019-06-11 DIAGNOSIS — L89326 Pressure-induced deep tissue damage of left buttock: Secondary | ICD-10-CM | POA: Diagnosis not present

## 2019-06-11 DIAGNOSIS — J449 Chronic obstructive pulmonary disease, unspecified: Secondary | ICD-10-CM | POA: Diagnosis not present

## 2019-06-11 DIAGNOSIS — G4733 Obstructive sleep apnea (adult) (pediatric): Secondary | ICD-10-CM | POA: Diagnosis not present

## 2019-06-11 DIAGNOSIS — Z44112 Encounter for fitting and adjustment of complete left artificial leg: Secondary | ICD-10-CM | POA: Diagnosis not present

## 2019-06-11 DIAGNOSIS — E1151 Type 2 diabetes mellitus with diabetic peripheral angiopathy without gangrene: Secondary | ICD-10-CM | POA: Diagnosis not present

## 2019-06-11 DIAGNOSIS — M654 Radial styloid tenosynovitis [de Quervain]: Secondary | ICD-10-CM | POA: Diagnosis not present

## 2019-06-11 DIAGNOSIS — G8929 Other chronic pain: Secondary | ICD-10-CM | POA: Diagnosis not present

## 2019-06-11 DIAGNOSIS — I10 Essential (primary) hypertension: Secondary | ICD-10-CM | POA: Diagnosis not present

## 2019-06-11 DIAGNOSIS — D649 Anemia, unspecified: Secondary | ICD-10-CM | POA: Diagnosis not present

## 2019-06-11 DIAGNOSIS — J329 Chronic sinusitis, unspecified: Secondary | ICD-10-CM | POA: Diagnosis not present

## 2019-06-11 DIAGNOSIS — G2581 Restless legs syndrome: Secondary | ICD-10-CM | POA: Diagnosis not present

## 2019-06-11 DIAGNOSIS — F3181 Bipolar II disorder: Secondary | ICD-10-CM | POA: Diagnosis not present

## 2019-06-11 DIAGNOSIS — E1140 Type 2 diabetes mellitus with diabetic neuropathy, unspecified: Secondary | ICD-10-CM | POA: Diagnosis not present

## 2019-06-11 DIAGNOSIS — E785 Hyperlipidemia, unspecified: Secondary | ICD-10-CM | POA: Diagnosis not present

## 2019-06-11 DIAGNOSIS — K219 Gastro-esophageal reflux disease without esophagitis: Secondary | ICD-10-CM | POA: Diagnosis not present

## 2019-06-11 DIAGNOSIS — T8789 Other complications of amputation stump: Secondary | ICD-10-CM | POA: Diagnosis not present

## 2019-06-11 DIAGNOSIS — E1142 Type 2 diabetes mellitus with diabetic polyneuropathy: Secondary | ICD-10-CM | POA: Diagnosis not present

## 2019-06-11 DIAGNOSIS — B372 Candidiasis of skin and nail: Secondary | ICD-10-CM | POA: Diagnosis not present

## 2019-06-12 ENCOUNTER — Encounter: Payer: Medicaid Other | Admitting: Physical Therapy

## 2019-06-12 DIAGNOSIS — B372 Candidiasis of skin and nail: Secondary | ICD-10-CM | POA: Diagnosis not present

## 2019-06-12 DIAGNOSIS — E1140 Type 2 diabetes mellitus with diabetic neuropathy, unspecified: Secondary | ICD-10-CM | POA: Diagnosis not present

## 2019-06-12 DIAGNOSIS — L02211 Cutaneous abscess of abdominal wall: Secondary | ICD-10-CM | POA: Diagnosis not present

## 2019-06-12 DIAGNOSIS — M654 Radial styloid tenosynovitis [de Quervain]: Secondary | ICD-10-CM | POA: Diagnosis not present

## 2019-06-12 DIAGNOSIS — M1711 Unilateral primary osteoarthritis, right knee: Secondary | ICD-10-CM | POA: Diagnosis not present

## 2019-06-12 DIAGNOSIS — D649 Anemia, unspecified: Secondary | ICD-10-CM | POA: Diagnosis not present

## 2019-06-12 DIAGNOSIS — E785 Hyperlipidemia, unspecified: Secondary | ICD-10-CM | POA: Diagnosis not present

## 2019-06-12 DIAGNOSIS — E1151 Type 2 diabetes mellitus with diabetic peripheral angiopathy without gangrene: Secondary | ICD-10-CM | POA: Diagnosis not present

## 2019-06-12 DIAGNOSIS — T8789 Other complications of amputation stump: Secondary | ICD-10-CM | POA: Diagnosis not present

## 2019-06-12 DIAGNOSIS — J329 Chronic sinusitis, unspecified: Secondary | ICD-10-CM | POA: Diagnosis not present

## 2019-06-12 DIAGNOSIS — G4733 Obstructive sleep apnea (adult) (pediatric): Secondary | ICD-10-CM | POA: Diagnosis not present

## 2019-06-12 DIAGNOSIS — F3181 Bipolar II disorder: Secondary | ICD-10-CM | POA: Diagnosis not present

## 2019-06-12 DIAGNOSIS — Z44111 Encounter for fitting and adjustment of complete right artificial leg: Secondary | ICD-10-CM | POA: Diagnosis not present

## 2019-06-12 DIAGNOSIS — G2581 Restless legs syndrome: Secondary | ICD-10-CM | POA: Diagnosis not present

## 2019-06-12 DIAGNOSIS — L89326 Pressure-induced deep tissue damage of left buttock: Secondary | ICD-10-CM | POA: Diagnosis not present

## 2019-06-12 DIAGNOSIS — I10 Essential (primary) hypertension: Secondary | ICD-10-CM | POA: Diagnosis not present

## 2019-06-12 DIAGNOSIS — G8929 Other chronic pain: Secondary | ICD-10-CM | POA: Diagnosis not present

## 2019-06-12 DIAGNOSIS — M1812 Unilateral primary osteoarthritis of first carpometacarpal joint, left hand: Secondary | ICD-10-CM | POA: Diagnosis not present

## 2019-06-12 DIAGNOSIS — I83893 Varicose veins of bilateral lower extremities with other complications: Secondary | ICD-10-CM | POA: Diagnosis not present

## 2019-06-12 DIAGNOSIS — E559 Vitamin D deficiency, unspecified: Secondary | ICD-10-CM | POA: Diagnosis not present

## 2019-06-12 DIAGNOSIS — E1142 Type 2 diabetes mellitus with diabetic polyneuropathy: Secondary | ICD-10-CM | POA: Diagnosis not present

## 2019-06-12 DIAGNOSIS — J449 Chronic obstructive pulmonary disease, unspecified: Secondary | ICD-10-CM | POA: Diagnosis not present

## 2019-06-12 DIAGNOSIS — K219 Gastro-esophageal reflux disease without esophagitis: Secondary | ICD-10-CM | POA: Diagnosis not present

## 2019-06-12 DIAGNOSIS — Z44112 Encounter for fitting and adjustment of complete left artificial leg: Secondary | ICD-10-CM | POA: Diagnosis not present

## 2019-06-12 DIAGNOSIS — E43 Unspecified severe protein-calorie malnutrition: Secondary | ICD-10-CM | POA: Diagnosis not present

## 2019-06-13 ENCOUNTER — Ambulatory Visit: Payer: Medicaid Other | Admitting: Internal Medicine

## 2019-06-13 ENCOUNTER — Telehealth: Payer: Self-pay

## 2019-06-13 ENCOUNTER — Encounter (HOSPITAL_BASED_OUTPATIENT_CLINIC_OR_DEPARTMENT_OTHER): Payer: Medicaid Other | Attending: Physician Assistant

## 2019-06-13 DIAGNOSIS — L98495 Non-pressure chronic ulcer of skin of other sites with muscle involvement without evidence of necrosis: Secondary | ICD-10-CM | POA: Insufficient documentation

## 2019-06-13 DIAGNOSIS — J449 Chronic obstructive pulmonary disease, unspecified: Secondary | ICD-10-CM | POA: Insufficient documentation

## 2019-06-13 DIAGNOSIS — E1140 Type 2 diabetes mellitus with diabetic neuropathy, unspecified: Secondary | ICD-10-CM | POA: Diagnosis not present

## 2019-06-13 DIAGNOSIS — Z794 Long term (current) use of insulin: Secondary | ICD-10-CM | POA: Insufficient documentation

## 2019-06-13 DIAGNOSIS — Z6841 Body Mass Index (BMI) 40.0 and over, adult: Secondary | ICD-10-CM | POA: Insufficient documentation

## 2019-06-13 DIAGNOSIS — Z8631 Personal history of diabetic foot ulcer: Secondary | ICD-10-CM | POA: Insufficient documentation

## 2019-06-13 DIAGNOSIS — Z87891 Personal history of nicotine dependence: Secondary | ICD-10-CM | POA: Insufficient documentation

## 2019-06-13 DIAGNOSIS — Z89421 Acquired absence of other right toe(s): Secondary | ICD-10-CM | POA: Insufficient documentation

## 2019-06-13 DIAGNOSIS — I10 Essential (primary) hypertension: Secondary | ICD-10-CM | POA: Insufficient documentation

## 2019-06-13 DIAGNOSIS — L0231 Cutaneous abscess of buttock: Secondary | ICD-10-CM | POA: Insufficient documentation

## 2019-06-13 NOTE — Telephone Encounter (Signed)
Brittney informed of verbal orders and pt was scheduled for a virtual visit tomorrow morning. Deseree Kennon Holter, CMA

## 2019-06-13 NOTE — Progress Notes (Deleted)
Virtual Visit via Video Note  I connected with [name] on 06/13/19 at  1:00 PM EDT by a video enabled telemedicine application and verified that I am speaking with the correct person using two identifiers.   I discussed the limitations of evaluation and management by telemedicine and the availability of in person appointments. The patient expressed understanding and agreed to proceed.   -Location of the patient : Home -Location of the provider : Office -The names of all persons participating in the telemedicine service : Pt and myself        Name: [name]  Age/ Sex: 43 y.o., female   MRN/ DOB: [id number], 1976/11/25     PCP: Belinda Rio, MD   Reason for Endocrinology Evaluation: Type 2 Diabetes Mellitus     Initial Endocrinology Clinic Visit: 05/13/2014    PATIENT IDENTIFIER: [name] is a 43 y.o. 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 The patient has followed with Endocrinology clinic since 05/13/2014 for consultative assistance with management of her diabetes.  DIABETIC HISTORY:  [name] was diagnosed with T2DM in 2001 when she was hospitalized for symptomatic hyperglycemia. She was started insulin therapy in 2007. Her hemoglobin A1c has ranged from 9.6% in 2019, peaking at 14.6% in 2012.  On her initial visit to our clinic her A1c was 14.0% . She was on Metformin, byetta , novolog and Lantus   She is intolerant to metformin She was lost to follow from 2018-2020, upon her presentation back to our clinic in 2020, she was on Novolog Mix 70/30 .   SUBJECTIVE:    Today (06/13/2019): [name] is here for a virtual follow up visit on diabetes management. She is transferring care from Dr. Loanne [name]  She checks her blood sugars 2 times daily, preprandial to breakfast and supper. The patient has not had hypoglycemic episodes since the last clinic visit. Otherwise, the  patient has not required any recent emergency interventions for hypoglycemia and has not had recent hospitalizations secondary to hyper or hypoglycemic episodes.   Pt eats 2 meals a day, snacks on apples, grapes and apple sauce.   ROS: As per HPI and as detailed below: Review of Systems  Constitutional: Negative for fever.  HENT: Negative for congestion and sore throat.   Respiratory: Negative for cough and shortness of breath.   Cardiovascular: Positive for palpitations. Negative for chest pain.  Genitourinary: Positive for frequency.  Neurological: Positive for tingling.  Endo/Heme/Allergies: Positive for polydipsia.  Psychiatric/Behavioral: Positive for depression. The patient is nervous/anxious.       HOME DIABETES REGIMEN:  Humulin Mix (70/30) 60 units QAM, 40 units QPM  Statin: yes ACE-I/ARB: no   GLUCOSE LOG:  Date Breakfast  Supper  04/11/2019 120   5/5 100 200  5/4 120 225  5/3 100 235     DIABETIC COMPLICATIONS: Microvascular complications:   Neuropathy( S/P Left BKA and Right toes amputations)  Denies: retinopathy, CKD  Last Eye Exam: Completed 04/2018  Macrovascular complications:    Denies: CAD, CVA, PVD   HISTORY:  Past Medical History:  Past Medical History:  Diagnosis Date   Acute osteomyelitis of ankle or foot, left ([place]) 01/31/2018   Alveolar hypoventilation    Anemia    not on iron pill   Asthma    Bipolar 2 disorder ([name])    Carpal tunnel syndrome on right    recurrent  Cellulitis 08/2010-08/2011  °• Chronic pain   °• COPD (chronic obstructive pulmonary disease) ([name])   ° Symbicort daily and Proventil as needed  °• Costochondritis   °• Depression   °• Diabetes mellitus type II, uncontrolled ([name]) 2000  ° Type 2, Uncontrolled.Takes Lantus daily.Fasting blood sugar runs 150  °• Dizziness   ° occasionally  °• Drug-seeking behavior   °• GERD (gastroesophageal reflux disease)   ° takes Pantoprazole and Zantac daily  °• Headache   °  migraine-last one about a yr ago.Topamax daily  °• HLD (hyperlipidemia)   ° takes Atorvastatin daily  °• Hypertension   ° takes Lisinopril and Coreg daily  °• Morbid obesity ([name])   °• Muscle spasm   ° takes Flexeril as needed  °• Nocturia   °• OSA on CPAP   °• Peripheral neuropathy   ° takes Gabapentin daily  °• Pneumonia   ° "walking" several yrs ago and as a baby (12/05/2018)  °• Rectal fissure   °• Restless leg   °• Syncope 02/25/2016  °• Urinary frequency   °• Varicose veins   ° Right medial thigh and Left leg   ° °Past Surgical History:  °Past Surgical History:  °Procedure Laterality Date  °• AMPUTATION Left 02/01/2018  ° Procedure: LEFT FOURTH AND 5TH TOE RAY AMPUTATION;  Surgeon: Duda, Marcus V, MD;  Location: [facility] OR;  Service: Orthopedics;  Laterality: Left;  °• AMPUTATION Left 03/03/2018  ° Procedure: LEFT BELOW KNEE AMPUTATION;  Surgeon: Duda, Marcus V, MD;  Location: [facility] OR;  Service: Orthopedics;  Laterality: Left;  °• CARPAL TUNNEL RELEASE Bilateral   °• CESAREAN SECTION  2007  °• INCISION AND DRAINAGE PERIRECTAL ABSCESS Left 05/18/2019  ° Procedure: IRRIGATION AND DEBRIDEMENT OF PANNIS ABSCESS, POSSIBLE DEBRIDEMENT OF BUTTOCK WOUND;  Surgeon: Tsuei, Matthew, MD;  Location: [facility] OR;  Service: General;  Laterality: Left;  °• IRRIGATION AND DEBRIDEMENT BUTTOCKS Left 05/17/2019  ° Procedure: IRRIGATION AND DEBRIDEMENT BUTTOCKS;  Surgeon: Tsuei, Matthew, MD;  Location: [facility] OR;  Service: General;  Laterality: Left;  °• KNEE ARTHROSCOPY Right 07/17/2010  °• LEFT HEART CATHETERIZATION WITH CORONARY ANGIOGRAM N/A 07/27/2012  ° Procedure: LEFT HEART CATHETERIZATION WITH CORONARY ANGIOGRAM;  Surgeon: Michael Cooper, MD;  Location: [facility] CATH LAB;  Service: Cardiovascular;  Laterality: N/A;  °• MASS EXCISION N/A 06/29/2013  ° Procedure:  WIDE LOCAL EXCISION OF POSTERIOR NECK ABSCESS;  Surgeon: Armando Ramirez, MD;  Location: [facility] OR;  Service: General;  Laterality: N/A;  °• REPAIR KNEE LIGAMENT Left   ° "fixed ligaments and  chipped patella"  °• right transmetatarsal amputation     ° °· Social History:  reports that she quit smoking about 25 years ago. Her smoking use included cigarettes. She has a 0.09 pack-year smoking history. She has never used smokeless tobacco. She reports current drug use. She reports that she does not drink alcohol. °Family History:  °Family History  °Problem Relation Age of Onset  °• Diabetes Mother   °• Hyperlipidemia Mother   °• Depression Mother   °• Varicose Veins Mother   °• Heart attack Paternal Uncle   °• Heart disease Paternal Grandmother   °• Heart attack Paternal Grandmother   °• Heart attack Paternal Grandfather   °• Heart disease Paternal Grandfather   °• Heart attack Father   °• Cancer Maternal Grandmother   °     COLON  °• Hypertension Maternal Grandmother   °• Hyperlipidemia Maternal Grandmother   °• Diabetes Maternal Grandmother   °• Other   Maternal Grandfather        GUN SHOT     HOME MEDICATIONS: Allergies as of 06/13/2019      Reactions   Kiwi Extract Shortness Of Breath, Swelling   Cefepime    AKI, see records from [place] hospitalization in January 2020   Trental [pentoxifylline] Nausea And Vomiting   Nubain [nalbuphine Hcl] Other (See Comments)   "FEELS LIKE SOMETHING CRAWLING ON ME"      Medication List       Accurate as of June 13, 2019 10:34 AM. If you have any questions, ask your nurse or doctor.        Accu-Chek Nano SmartView w/Device Kit Use to check blood sugar 3 times daily   Accu-Chek Softclix Lancets lancets Use as instructed to check blood sugar 3 times per day What changed: additional instructions   amLODipine 10 MG tablet Commonly known as: NORVASC Take 1 tablet (10 mg total) by mouth daily.   atorvastatin 20 MG tablet Commonly known as: LIPITOR Take 1 tablet (20 mg total) by mouth daily.   budesonide-formoterol 160-4.5 MCG/ACT inhaler Commonly known as: Symbicort inhale 2 PUFFS into THE lungs 2 TIMES DAILY What changed:   how much to  take  how to take this  when to take this  additional instructions   Cholecalciferol 25 MCG (1000 UT) tablet Take 1 tablet (1,000 Units total) by mouth daily.   collagenase ointment Commonly known as: SANTYL Apply topically daily.   cyclobenzaprine 5 MG tablet Commonly known as: FLEXERIL TAKE 1 TABLET BY MOUTH 3 TIMES DAILY AS NEEDED FOR MUSCLE SPASMS What changed: See the new instructions.   Ensure Max Protein Liqd Take 330 mLs (11 oz total) by mouth daily.   nutrition supplement (JUVEN) Pack Take 1 packet by mouth 2 (two) times daily between meals.   gabapentin 600 MG tablet Commonly known as: NEURONTIN TAKE 2 TABLETS BY MOUTH 3 TIMES DAILY What changed: See the new instructions.   glucose blood test strip Commonly known as: Accu-Chek Guide USE T0 CHECK BLOOD SUGAR 3 TIMES DAILY   HYDROcodone-acetaminophen 10-325 MG tablet Commonly known as: NORCO Take 1 tablet by mouth every 8 (eight) hours as needed. What changed: when to take this   insulin glargine 100 UNIT/ML injection Commonly known as: LANTUS Inject 0.4 mLs (40 Units total) into the skin daily.   Insulin Isophane & Regular Human (70-30) 100 UNIT/ML PEN Commonly known as: HUMULIN 70/30 MIX Inject 70 Units into the skin daily with breakfast AND 35 Units daily with supper.   lactobacillus acidophilus Tabs tablet Take 2 tablets by mouth 3 (three) times daily.   multivitamin with minerals Tabs tablet Take 1 tablet by mouth daily.   nystatin cream Commonly known as: MYCOSTATIN Apply topically 2 (two) times daily.   Oxycodone HCl 10 MG Tabs Take 1 tablet (10 mg total) by mouth 2 (two) times daily as needed (Wound dressing Changes).   pantoprazole 40 MG tablet Commonly known as: PROTONIX Take 1 tablet (40 mg total) by mouth daily.   promethazine 12.5 MG tablet Commonly known as: PHENERGAN Take 1 tablet (12.5 mg total) by mouth every 6 (six) hours as needed for nausea or vomiting.   Proventil HFA  108 (90 Base) MCG/ACT inhaler Generic drug: albuterol Inhale 1-2 puffs into the lungs every 6 (six) hours as needed for wheezing or shortness of breath. What changed: See the new instructions.   psyllium 95 % Pack Commonly known as: HYDROCIL/METAMUCIL Take 1 packet by  mouth daily.         DATA REVIEWED:  Lab Results  Component Value Date   HGBA1C 17.5 (H) 05/17/2019   HGBA1C 10.1 (H) 12/06/2018   HGBA1C 9.6 (A) 04/25/2018   Lab Results  Component Value Date   MICROALBUR 17.4 (H) 01/24/2015   LDLCALC Comment 09/19/2018   CREATININE 1.09 (H) 05/25/2019   Lab Results  Component Value Date   MICRALBCREAT 728.0 (H) 01/24/2015     Lab Results  Component Value Date   CHOL 343 (H) 09/19/2018   HDL 35 (L) 09/19/2018   LDLCALC Comment 09/19/2018   LDLDIRECT 164.9 11/15/2014   TRIG 456 (H) 09/19/2018   CHOLHDL 9.8 (H) 09/19/2018         ASSESSMENT / PLAN / RECOMMENDATIONS:   1) Type 2 Diabetes Mellitus, Poorly controlled, With neuropathic complications - Most recent A1c of 10.1 %. Goal A1c < 7.0 %.   Plan: - Historically poorly controlled diabetes due to medication non-adherence and dietary indiscretions.  - I have discussed with the patient the pathophysiology of diabetes. We went over the natural progression of the disease. We talked about both insulin resistance and insulin deficiency. We stressed the importance of lifestyle changes including diet and exercise. I explained the complications associated with diabetes including retinopathy, nephropathy, neuropathy as well as increased risk of cardiovascular disease. We went over the benefit seen with glycemic control.  - Discussed pharmacokinetics of basal/bolus insulin and the importance of taking prandial insulin with meals.  We also discussed avoiding sugar-sweetened beverages and snacks, when possible.    MEDICATIONS:  Increase Humalog mix (70/30) to 70 units with Breakfast   Decrease Humalog Mix (70/30) to 35  units with Supper   EDUCATION / INSTRUCTIONS:  BG monitoring instructions: Patient is instructed to check her blood sugars 2 times a day, fasting and supper time.  Call [facility] if: BG persistently < 70 or > 300.  I reviewed the Rule of 15 for the treatment of hypoglycemia in detail with the patient. Literature supplied.   2) Diabetic complications:   Eye: Does not have known diabetic retinopathy.   Neuro/ Feet: Does  have known diabetic peripheral neuropathy .   Renal: Patient does not have known baseline CKD. She is not on an ACEI/ARB at present.   3) Lipids: Patient is  on a statin. Her TG are very high, we discussed risk of pancreatitis with high TG. I am hoping her TG will improve with reduces CHO intake and BG improvement, if not will consider adding a fibrate.   4) Left BKA, Right toes amputations     I discussed the assessment and treatment plan with the patient. The patient was provided an opportunity to ask questions and all were answered. The patient agreed with the plan and demonstrated an understanding of the instructions.   The patient was advised to call back or seek an in-person evaluation if the symptoms worsen or if the condition fails to improve as anticipated.    F/U in 2 months    Signed electronically by: Mack Guise, MD  [facility]  [facility] [address] [place], [place] [address] Phone: [phone] FAX: [phone]   CC: [name], [place] [address] Phone: [phone]  Fax: [phone]  Return to Endocrinology clinic as below: Future Appointments  Date Time Provider [place]  06/13/2019  1:00 PM Zandyr Barnhill, Melanie Crazier, MD [facility] None  06/13/2019  1:30 PM [facility]  °  ° ° ° °

## 2019-06-13 NOTE — Telephone Encounter (Signed)
Brittney, patients home health nurse, called nurse line requesting verbal orders for a UA. Brittney stated the patient has been complaining of urinary frequency and dysuria. You can call Brittney with verbal orders [phone], ok to leave VM.

## 2019-06-13 NOTE — Telephone Encounter (Signed)
Please authorize verbal order, thanks Would also recommend patient schedule virtual visit to discuss her symptoms  [name], MD

## 2019-06-14 ENCOUNTER — Encounter: Payer: Medicaid Other | Admitting: Physical Therapy

## 2019-06-14 ENCOUNTER — Telehealth: Payer: Self-pay | Admitting: Family Medicine

## 2019-06-14 ENCOUNTER — Telehealth: Payer: Self-pay | Admitting: *Deleted

## 2019-06-14 ENCOUNTER — Telehealth (INDEPENDENT_AMBULATORY_CARE_PROVIDER_SITE_OTHER): Payer: Medicaid Other | Admitting: Family Medicine

## 2019-06-14 ENCOUNTER — Other Ambulatory Visit: Payer: Self-pay

## 2019-06-14 DIAGNOSIS — E1140 Type 2 diabetes mellitus with diabetic neuropathy, unspecified: Secondary | ICD-10-CM | POA: Diagnosis not present

## 2019-06-14 DIAGNOSIS — R300 Dysuria: Secondary | ICD-10-CM | POA: Diagnosis not present

## 2019-06-14 NOTE — Telephone Encounter (Signed)
Pt is calling and would like for Dr. [name] to call her to discuss her physical therapy.  Also she said she would need a new referral to continue the physical therapy.   The best call back number is [phone]

## 2019-06-14 NOTE — Progress Notes (Signed)
[place] Telemedicine Visit  Patient consented to have virtual visit. Method of visit: Telephone  Encounter participants: Patient: [name] - located at home Provider: [name] - located at [facility] Others (if applicable): none  Chief Complaint: Cloudy urine  HPI: [name] reports that she has been having cloudy urine with painful urination for about 1 week.  She denies frequent urination, stomach pain, back pain, fevers, nausea, vomiting.  She was started on Bactrim 2 weeks ago by her wound care nurse.  She has not previously had many UTIs and is difficult for her to say if this feels similar to previous experiences.  She denies vaginal discharge.  There is currently an order in for home health nurse to collect a urine sample today which will be brought to the lab on 7/10.  She has been following regularly with wound care and reports that her wounds are healing well and quickly.  Overall the wound care team is satisfied with her progress.   ROS: per HPI  Pertinent PMHx: Diabetes, poorly controlled, recent hospitalization for gluteal and pannus abscesses.  Exam:  Respiratory: Breathing comfortably on room air.  Able to complete long sentences without effort.  No noticeable respiratory symptoms.  Assessment/Plan:  Dysuria Dysuria with cloudy urine or symptoms suspicious for UTI.  She is currently been taking Bactrim for the past 2 weeks.  If she is currently experiencing UTI, would likely be resistant to Bactrim.  Will await UA at least before starting antibiotics.  Would prefer culture with sensitivities for appropriate antibiotic treatment. -Follow-up UA -Follow-up culture -Decide antibiotic course based on lab findings   Time spent during visit with patient: 15 minutes

## 2019-06-14 NOTE — Telephone Encounter (Signed)
-----   Message from [name], MD sent at 06/14/2019 12:23 PM EDT ----- Regarding: concern for UTI Please call this home health nurse [phone] [place]) and ask that a urine culture be added to her urinalysis.  Thank you, [name], MD

## 2019-06-14 NOTE — Telephone Encounter (Signed)
LM for home health nurse with order for urine culture.  Jazmin Hartsell,CMA

## 2019-06-14 NOTE — Assessment & Plan Note (Signed)
Dysuria with cloudy urine or symptoms suspicious for UTI.  She is currently been taking Bactrim for the past 2 weeks.  If she is currently experiencing UTI, would likely be resistant to Bactrim.  Will await UA at least before starting antibiotics.  Would prefer culture with sensitivities for appropriate antibiotic treatment. -Follow-up UA -Follow-up culture -Decide antibiotic course based on lab findings

## 2019-06-15 DIAGNOSIS — E559 Vitamin D deficiency, unspecified: Secondary | ICD-10-CM | POA: Diagnosis not present

## 2019-06-15 DIAGNOSIS — D649 Anemia, unspecified: Secondary | ICD-10-CM | POA: Diagnosis not present

## 2019-06-15 DIAGNOSIS — M654 Radial styloid tenosynovitis [de Quervain]: Secondary | ICD-10-CM | POA: Diagnosis not present

## 2019-06-15 DIAGNOSIS — G8929 Other chronic pain: Secondary | ICD-10-CM | POA: Diagnosis not present

## 2019-06-15 DIAGNOSIS — L89326 Pressure-induced deep tissue damage of left buttock: Secondary | ICD-10-CM | POA: Diagnosis not present

## 2019-06-15 DIAGNOSIS — Z44112 Encounter for fitting and adjustment of complete left artificial leg: Secondary | ICD-10-CM | POA: Diagnosis not present

## 2019-06-15 DIAGNOSIS — G2581 Restless legs syndrome: Secondary | ICD-10-CM | POA: Diagnosis not present

## 2019-06-15 DIAGNOSIS — L02211 Cutaneous abscess of abdominal wall: Secondary | ICD-10-CM | POA: Diagnosis not present

## 2019-06-15 DIAGNOSIS — I10 Essential (primary) hypertension: Secondary | ICD-10-CM | POA: Diagnosis not present

## 2019-06-15 DIAGNOSIS — F3181 Bipolar II disorder: Secondary | ICD-10-CM | POA: Diagnosis not present

## 2019-06-15 DIAGNOSIS — E1142 Type 2 diabetes mellitus with diabetic polyneuropathy: Secondary | ICD-10-CM | POA: Diagnosis not present

## 2019-06-15 DIAGNOSIS — E43 Unspecified severe protein-calorie malnutrition: Secondary | ICD-10-CM | POA: Diagnosis not present

## 2019-06-15 DIAGNOSIS — E785 Hyperlipidemia, unspecified: Secondary | ICD-10-CM | POA: Diagnosis not present

## 2019-06-15 DIAGNOSIS — M1812 Unilateral primary osteoarthritis of first carpometacarpal joint, left hand: Secondary | ICD-10-CM | POA: Diagnosis not present

## 2019-06-15 DIAGNOSIS — M1711 Unilateral primary osteoarthritis, right knee: Secondary | ICD-10-CM | POA: Diagnosis not present

## 2019-06-15 DIAGNOSIS — B372 Candidiasis of skin and nail: Secondary | ICD-10-CM | POA: Diagnosis not present

## 2019-06-15 DIAGNOSIS — J449 Chronic obstructive pulmonary disease, unspecified: Secondary | ICD-10-CM | POA: Diagnosis not present

## 2019-06-15 DIAGNOSIS — E1151 Type 2 diabetes mellitus with diabetic peripheral angiopathy without gangrene: Secondary | ICD-10-CM | POA: Diagnosis not present

## 2019-06-15 DIAGNOSIS — K219 Gastro-esophageal reflux disease without esophagitis: Secondary | ICD-10-CM | POA: Diagnosis not present

## 2019-06-15 DIAGNOSIS — E1140 Type 2 diabetes mellitus with diabetic neuropathy, unspecified: Secondary | ICD-10-CM | POA: Diagnosis not present

## 2019-06-15 DIAGNOSIS — Z44111 Encounter for fitting and adjustment of complete right artificial leg: Secondary | ICD-10-CM | POA: Diagnosis not present

## 2019-06-15 DIAGNOSIS — J329 Chronic sinusitis, unspecified: Secondary | ICD-10-CM | POA: Diagnosis not present

## 2019-06-15 DIAGNOSIS — I83893 Varicose veins of bilateral lower extremities with other complications: Secondary | ICD-10-CM | POA: Diagnosis not present

## 2019-06-15 DIAGNOSIS — G4733 Obstructive sleep apnea (adult) (pediatric): Secondary | ICD-10-CM | POA: Diagnosis not present

## 2019-06-15 DIAGNOSIS — T8789 Other complications of amputation stump: Secondary | ICD-10-CM | POA: Diagnosis not present

## 2019-06-16 DIAGNOSIS — E1140 Type 2 diabetes mellitus with diabetic neuropathy, unspecified: Secondary | ICD-10-CM | POA: Diagnosis not present

## 2019-06-17 DIAGNOSIS — E1140 Type 2 diabetes mellitus with diabetic neuropathy, unspecified: Secondary | ICD-10-CM | POA: Diagnosis not present

## 2019-06-18 DIAGNOSIS — D649 Anemia, unspecified: Secondary | ICD-10-CM | POA: Diagnosis not present

## 2019-06-18 DIAGNOSIS — L02211 Cutaneous abscess of abdominal wall: Secondary | ICD-10-CM | POA: Diagnosis not present

## 2019-06-18 DIAGNOSIS — M1812 Unilateral primary osteoarthritis of first carpometacarpal joint, left hand: Secondary | ICD-10-CM | POA: Diagnosis not present

## 2019-06-18 DIAGNOSIS — T8789 Other complications of amputation stump: Secondary | ICD-10-CM | POA: Diagnosis not present

## 2019-06-18 DIAGNOSIS — J449 Chronic obstructive pulmonary disease, unspecified: Secondary | ICD-10-CM | POA: Diagnosis not present

## 2019-06-18 DIAGNOSIS — I10 Essential (primary) hypertension: Secondary | ICD-10-CM | POA: Diagnosis not present

## 2019-06-18 DIAGNOSIS — E559 Vitamin D deficiency, unspecified: Secondary | ICD-10-CM | POA: Diagnosis not present

## 2019-06-18 DIAGNOSIS — G4733 Obstructive sleep apnea (adult) (pediatric): Secondary | ICD-10-CM | POA: Diagnosis not present

## 2019-06-18 DIAGNOSIS — E1142 Type 2 diabetes mellitus with diabetic polyneuropathy: Secondary | ICD-10-CM | POA: Diagnosis not present

## 2019-06-18 DIAGNOSIS — L89326 Pressure-induced deep tissue damage of left buttock: Secondary | ICD-10-CM | POA: Diagnosis not present

## 2019-06-18 DIAGNOSIS — G8929 Other chronic pain: Secondary | ICD-10-CM | POA: Diagnosis not present

## 2019-06-18 DIAGNOSIS — G2581 Restless legs syndrome: Secondary | ICD-10-CM | POA: Diagnosis not present

## 2019-06-18 DIAGNOSIS — E43 Unspecified severe protein-calorie malnutrition: Secondary | ICD-10-CM | POA: Diagnosis not present

## 2019-06-18 DIAGNOSIS — M1711 Unilateral primary osteoarthritis, right knee: Secondary | ICD-10-CM | POA: Diagnosis not present

## 2019-06-18 DIAGNOSIS — I83893 Varicose veins of bilateral lower extremities with other complications: Secondary | ICD-10-CM | POA: Diagnosis not present

## 2019-06-18 DIAGNOSIS — B372 Candidiasis of skin and nail: Secondary | ICD-10-CM | POA: Diagnosis not present

## 2019-06-18 DIAGNOSIS — F3181 Bipolar II disorder: Secondary | ICD-10-CM | POA: Diagnosis not present

## 2019-06-18 DIAGNOSIS — M654 Radial styloid tenosynovitis [de Quervain]: Secondary | ICD-10-CM | POA: Diagnosis not present

## 2019-06-18 DIAGNOSIS — E785 Hyperlipidemia, unspecified: Secondary | ICD-10-CM | POA: Diagnosis not present

## 2019-06-18 DIAGNOSIS — E1151 Type 2 diabetes mellitus with diabetic peripheral angiopathy without gangrene: Secondary | ICD-10-CM | POA: Diagnosis not present

## 2019-06-18 DIAGNOSIS — K219 Gastro-esophageal reflux disease without esophagitis: Secondary | ICD-10-CM | POA: Diagnosis not present

## 2019-06-18 DIAGNOSIS — Z44112 Encounter for fitting and adjustment of complete left artificial leg: Secondary | ICD-10-CM | POA: Diagnosis not present

## 2019-06-18 DIAGNOSIS — Z44111 Encounter for fitting and adjustment of complete right artificial leg: Secondary | ICD-10-CM | POA: Diagnosis not present

## 2019-06-18 DIAGNOSIS — J329 Chronic sinusitis, unspecified: Secondary | ICD-10-CM | POA: Diagnosis not present

## 2019-06-20 ENCOUNTER — Emergency Department (HOSPITAL_COMMUNITY)
Admission: EM | Admit: 2019-06-20 | Discharge: 2019-06-20 | Disposition: A | Discharge status: Home / Self Care | Payer: Medicaid Other | Attending: Emergency Medicine | Admitting: Emergency Medicine

## 2019-06-20 ENCOUNTER — Telehealth: Payer: Self-pay | Admitting: Family Medicine

## 2019-06-20 ENCOUNTER — Encounter (HOSPITAL_COMMUNITY): Payer: Self-pay | Admitting: Emergency Medicine

## 2019-06-20 DIAGNOSIS — B379 Candidiasis, unspecified: Secondary | ICD-10-CM

## 2019-06-20 DIAGNOSIS — J449 Chronic obstructive pulmonary disease, unspecified: Secondary | ICD-10-CM | POA: Insufficient documentation

## 2019-06-20 DIAGNOSIS — E119 Type 2 diabetes mellitus without complications: Secondary | ICD-10-CM | POA: Insufficient documentation

## 2019-06-20 DIAGNOSIS — I10 Essential (primary) hypertension: Secondary | ICD-10-CM | POA: Diagnosis not present

## 2019-06-20 DIAGNOSIS — M791 Myalgia, unspecified site: Secondary | ICD-10-CM | POA: Diagnosis present

## 2019-06-20 DIAGNOSIS — E1140 Type 2 diabetes mellitus with diabetic neuropathy, unspecified: Secondary | ICD-10-CM | POA: Diagnosis not present

## 2019-06-20 DIAGNOSIS — J45909 Unspecified asthma, uncomplicated: Secondary | ICD-10-CM | POA: Insufficient documentation

## 2019-06-20 DIAGNOSIS — Z87891 Personal history of nicotine dependence: Secondary | ICD-10-CM | POA: Insufficient documentation

## 2019-06-20 DIAGNOSIS — Z79899 Other long term (current) drug therapy: Secondary | ICD-10-CM | POA: Diagnosis not present

## 2019-06-20 DIAGNOSIS — Z794 Long term (current) use of insulin: Secondary | ICD-10-CM | POA: Diagnosis not present

## 2019-06-20 DIAGNOSIS — R52 Pain, unspecified: Secondary | ICD-10-CM | POA: Diagnosis not present

## 2019-06-20 LAB — URINALYSIS, ROUTINE W REFLEX MICROSCOPIC
Bilirubin Urine: NEGATIVE
Glucose, UA: >=500 mg/dL — AB
Ketones, ur: NEGATIVE mg/dL
Nitrite: NEGATIVE
Protein, ur: 100 mg/dL — AB
Specific Gravity, Urine: 1.021 (ref 1.005–1.030)
WBC, UA: >50 WBC/hpf — ABNORMAL HIGH (ref 0–5)
pH: 5 (ref 5.0–8.0)

## 2019-06-20 LAB — HEPATIC FUNCTION PANEL
ALT: 14 U/L (ref 0–44)
AST: 9 U/L — ABNORMAL LOW (ref 15–41)
Albumin: 2.4 g/dL — ABNORMAL LOW (ref 3.5–5.0)
Alkaline Phosphatase: 203 U/L — ABNORMAL HIGH (ref 38–126)
Bilirubin, Direct: <0.1 mg/dL (ref 0.0–0.2)
Total Bilirubin: 0.1 mg/dL — ABNORMAL LOW (ref 0.3–1.2)
Total Protein: 8.7 g/dL — ABNORMAL HIGH (ref 6.5–8.1)

## 2019-06-20 LAB — CBC WITH DIFFERENTIAL/PLATELET
Abs Immature Granulocytes: 0.36 10*3/uL — ABNORMAL HIGH (ref 0.00–0.07)
Basophils Absolute: 0.1 10*3/uL (ref 0.0–0.1)
Basophils Relative: 1 %
Eosinophils Absolute: 0.3 10*3/uL (ref 0.0–0.5)
Eosinophils Relative: 2 %
HCT: 30.8 % — ABNORMAL LOW (ref 36.0–46.0)
Hemoglobin: 9.9 g/dL — ABNORMAL LOW (ref 12.0–15.0)
Immature Granulocytes: 3 %
Lymphocytes Relative: 31 %
Lymphs Abs: 4.1 10*3/uL — ABNORMAL HIGH (ref 0.7–4.0)
MCH: 29.1 pg (ref 26.0–34.0)
MCHC: 32.1 g/dL (ref 30.0–36.0)
MCV: 90.6 fL (ref 80.0–100.0)
Monocytes Absolute: 1.1 10*3/uL — ABNORMAL HIGH (ref 0.1–1.0)
Monocytes Relative: 8 %
Neutro Abs: 7.3 10*3/uL (ref 1.7–7.7)
Neutrophils Relative %: 55 %
Platelets: 551 10*3/uL — ABNORMAL HIGH (ref 150–400)
RBC: 3.4 MIL/uL — ABNORMAL LOW (ref 3.87–5.11)
RDW: 14.5 % (ref 11.5–15.5)
WBC: 13.2 10*3/uL — ABNORMAL HIGH (ref 4.0–10.5)
nRBC: 0 % (ref 0.0–0.2)

## 2019-06-20 LAB — BASIC METABOLIC PANEL
Anion gap: 12 (ref 5–15)
BUN: 30 mg/dL — ABNORMAL HIGH (ref 6–20)
CO2: 21 mmol/L — ABNORMAL LOW (ref 22–32)
Calcium: 9.3 mg/dL (ref 8.9–10.3)
Chloride: 94 mmol/L — ABNORMAL LOW (ref 98–111)
Creatinine, Ser: 1.11 mg/dL — ABNORMAL HIGH (ref 0.44–1.00)
GFR calc Af Amer: >60 mL/min (ref >60)
GFR calc non Af Amer: >60 mL/min (ref >60)
Glucose, Bld: 497 mg/dL — ABNORMAL HIGH (ref 70–99)
Potassium: 5.1 mmol/L (ref 3.5–5.1)
Sodium: 127 mmol/L — ABNORMAL LOW (ref 135–145)

## 2019-06-20 LAB — I-STAT BETA HCG BLOOD, ED (MC, WL, AP ONLY): I-stat hCG, quantitative: <5 m[IU]/mL (ref <5)

## 2019-06-20 MED ORDER — SODIUM CHLORIDE 0.9 % IV BOLUS
250.0000 mL | Freq: Once | INTRAVENOUS | Status: AC
  Administered 2019-06-20: 250 mL via INTRAVENOUS

## 2019-06-20 MED ORDER — FLUCONAZOLE 150 MG PO TABS
150.0000 mg | ORAL_TABLET | Freq: Once | ORAL | Status: AC
  Administered 2019-06-20: 150 mg via ORAL
  Filled 2019-06-20: qty 1

## 2019-06-20 MED ORDER — HYDROCODONE-ACETAMINOPHEN 5-325 MG PO TABS
2.0000 | ORAL_TABLET | Freq: Once | ORAL | Status: AC
  Administered 2019-06-20: 2 via ORAL
  Filled 2019-06-20: qty 2

## 2019-06-20 MED ORDER — HYDROMORPHONE HCL 1 MG/ML IJ SOLN
1.0000 mg | Freq: Once | INTRAMUSCULAR | Status: AC
  Administered 2019-06-20: 1 mg via INTRAVENOUS
  Filled 2019-06-20: qty 1

## 2019-06-20 MED ORDER — SODIUM CHLORIDE 0.9 % IV BOLUS
1000.0000 mL | Freq: Once | INTRAVENOUS | Status: AC
  Administered 2019-06-20: 1000 mL via INTRAVENOUS

## 2019-06-20 NOTE — ED Provider Notes (Signed)
[facility] DEPT Provider Note   CSN: [id number] Arrival date & time: 06/20/19  4098    History   Chief Complaint Chief Complaint  Patient presents with  . Generalized Body Aches    HPI [name] is a 43 y.o. female.     The history is provided by the patient.  Illness Location:  General Quality:  Aches Severity:  Mild Onset quality:  Gradual Timing:  Intermittent Progression:  Waxing and waning Chronicity:  Chronic Context:  Patient with pain all over, recent I and D to sacral wound. Wound care coming to house. Patient with chronic pain flare, home narcotics not helping.  Relieved by:  Nothing Worsened by:  Movement Associated symptoms: myalgias   Associated symptoms: no abdominal pain, no chest pain, no cough, no ear pain, no fever, no loss of consciousness, no rash, no shortness of breath, no sore throat and no vomiting     Past Medical History:  Diagnosis Date  . Acute osteomyelitis of ankle or foot, left ([place]) 01/31/2018  . Alveolar hypoventilation   . Anemia    not on iron pill  . Asthma   . Bipolar 2 disorder ([place])   . Carpal tunnel syndrome on right    recurrent  . Cellulitis 08/2010-08/2011  . Chronic pain   . COPD (chronic obstructive pulmonary disease) ([name])    Symbicort daily and Proventil as needed  . Costochondritis   . Depression   . Diabetes mellitus type II, uncontrolled ([place]) 2000   Type 2, Uncontrolled.Takes Lantus daily.Fasting blood sugar runs 150  . Dizziness    occasionally  . Drug-seeking behavior   . GERD (gastroesophageal reflux disease)    takes Pantoprazole and Zantac daily  . Headache    migraine-last one about a yr ago.Topamax daily  . HLD (hyperlipidemia)    takes Atorvastatin daily  . Hypertension    takes Lisinopril and Coreg daily  . Morbid obesity ([place])   . Muscle spasm    takes Flexeril as needed  . Nocturia   . OSA on CPAP   . Peripheral neuropathy    takes Gabapentin daily  .  Pneumonia    "walking" several yrs ago and as a baby (12/05/2018)  . Rectal fissure   . Restless leg   . Syncope 02/25/2016  . Urinary frequency   . Varicose veins    Right medial thigh and Left leg     Patient Active Problem List   Diagnosis Date Noted  . Dysuria 06/14/2019  . Abdominal pain   . Fever   . Abscess of skin of abdomen   . Abscess of buttock, right 05/17/2019  . Hyperglycemia   . Severe protein-calorie malnutrition ([place])   . Suspected Covid-19 Virus Infection 05/11/2019  . Amputated toe, right ([place]) 04/11/2019  . 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[place]) 04/11/2019  . Hypertriglyceridemia 04/11/2019  . Type 2 diabetes mellitus with diabetic polyneuropathy, with long-term current use of insulin ([place]) 04/11/2019  . Type 2 diabetes mellitus with hyperglycemia, with long-term current use of insulin ([place]) 04/11/2019  . Infection of right foot   . Cellulitis of great toe of right foot 10/29/2018  . Osteomyelitis of great toe of right foot ([place]) 10/29/2018  . Encounter for orthopedic aftercare following surgical amputation 06/28/2018  . Uncontrolled type 2 diabetes mellitus with insulin therapy ([place]) 04/16/2018  . Amputation stump infection ([place]) 04/09/2018  . Allergic rhinitis 03/06/2018  . Unilateral complete BKA, left, sequela ([place])   . Class 3  severe obesity due to excess calories with serious comorbidity and body mass index (BMI) of 50.0 to 59.9 in adult [facility])   . AKI (acute kidney injury) ([place])   . Decreased pedal pulses 06/10/2017  . Unilateral primary osteoarthritis, right knee 10/22/2016  . Primary osteoarthritis of first carpometacarpal joint of left hand 07/30/2016  . Diabetic neuropathy ([place]) 07/14/2016  . Nausea 03/17/2016  . De Quervain's tenosynovitis, bilateral 11/01/2015  . Vitamin D deficiency 09/05/2015  . Recurrent candidiasis of vagina 09/05/2015  . Varicose veins of leg with complications 98/92/1194  . Restless leg syndrome 10/17/2014  . Chronic sinusitis  07/18/2014  . Headache 07/15/2014  . Encounter for chronic pain management 06/30/2013  . HLD (hyperlipidemia) 11/19/2012  . Chest pain 06/27/2012  . Right carpal tunnel syndrome 09/01/2011  . Bilateral knee pain 09/01/2011  . DM (diabetes mellitus) type II uncontrolled, periph vascular disorder ([place]) 05/22/2008  . Morbid obesity ([place]) 05/22/2008  . 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 Depression 05/22/2008  . Obstructive sleep apnea 05/22/2008  . Hypertension 05/22/2008  . Asthma 05/22/2008  . GERD 05/22/2008    Past Surgical History:  Procedure Laterality Date  . AMPUTATION Left 02/01/2018   Procedure: LEFT FOURTH AND 5TH TOE RAY AMPUTATION;  Surgeon: [name], MD;  Location: [place] [facility];  Service: Orthopedics;  Laterality: Left;  . AMPUTATION Left 03/03/2018   Procedure: LEFT BELOW KNEE AMPUTATION;  Surgeon: [name], MD;  Location: [facility];  Service: Orthopedics;  Laterality: Left;  . CARPAL TUNNEL RELEASE Bilateral   . CESAREAN SECTION  2007  . INCISION AND DRAINAGE PERIRECTAL ABSCESS Left 05/18/2019   Procedure: IRRIGATION AND DEBRIDEMENT OF PANNIS ABSCESS, POSSIBLE DEBRIDEMENT OF BUTTOCK WOUND;  Surgeon: Donnie Mesa, MD;  Location: [facility];  Service: General;  Laterality: Left;  . IRRIGATION AND DEBRIDEMENT BUTTOCKS Left 05/17/2019   Procedure: IRRIGATION AND DEBRIDEMENT BUTTOCKS;  Surgeon: Donnie Mesa, MD;  Location: [facility];  Service: General;  Laterality: Left;  . KNEE ARTHROSCOPY Right 07/17/2010  . LEFT HEART CATHETERIZATION WITH CORONARY ANGIOGRAM N/A 07/27/2012   Procedure: LEFT HEART CATHETERIZATION WITH CORONARY ANGIOGRAM;  Surgeon: Sherren Mocha, MD;  Location: [facility] CATH LAB;  Service: Cardiovascular;  Laterality: N/A;  . MASS EXCISION N/A 06/29/2013   Procedure:  WIDE LOCAL EXCISION OF POSTERIOR NECK ABSCESS;  Surgeon: Ralene Ok, MD;  Location: [facility];  Service: General;  Laterality: N/A;  . REPAIR KNEE LIGAMENT Left    "fixed ligaments and chipped  patella"  . right transmetatarsal amputation        OB History    Gravida  2   Para  1   Term      Preterm      AB  1   Living  1     SAB  1   TAB      Ectopic      Multiple      Live Births               Home Medications    Prior to Admission medications   Medication Sig Start Date End Date Taking? Authorizing Provider  ACCU-CHEK SOFTCLIX LANCETS lancets Use as instructed to check blood sugar 3 times per day 03/16/18  Yes Angiulli, Lavon Paganini, PA-C  amLODipine (NORVASC) 10 MG tablet Take 1 tablet (10 mg total) by mouth daily. 05/25/19  Yes [name], MD  atorvastatin (LIPITOR) 20 MG tablet Take 1 tablet (20 mg total) by mouth daily. 03/16/18  Yes [place],  Lavon Paganini, PA-C  Blood Glucose Monitoring Suppl (ACCU-CHEK NANO SMARTVIEW) w/Device KIT Use to check blood sugar 3 times daily 03/16/18  Yes Angiulli, Lavon Paganini, PA-C  budesonide-formoterol (SYMBICORT) 160-4.5 MCG/ACT inhaler inhale 2 PUFFS into THE lungs 2 TIMES DAILY Patient taking differently: Inhale 2 puffs into the lungs 2 (two) times daily.  10/10/18  Yes Hensel, Jamal Collin, MD  Cholecalciferol 1000 units tablet Take 1 tablet (1,000 Units total) by mouth daily. 04/05/17  Yes [name], MD  collagenase (SANTYL) ointment Apply topically daily. 05/25/19  Yes [name], MD  cyclobenzaprine (FLEXERIL) 5 MG tablet TAKE 1 TABLET BY MOUTH 3 TIMES DAILY AS NEEDED FOR MUSCLE SPASMS Patient taking differently: Take 5 mg by mouth 3 (three) times daily as needed for muscle spasms.  05/09/19  Yes [name], MD  Ensure Max Protein (ENSURE MAX PROTEIN) LIQD Take 330 mLs (11 oz total) by mouth daily. 05/25/19  Yes [name], MD  gabapentin (NEURONTIN) 600 MG tablet TAKE 2 TABLETS BY MOUTH 3 TIMES DAILY Patient taking differently: Take 1,200 mg by mouth 3 (three) times daily.  04/26/19  Yes [name], MD  glucose blood (ACCU-CHEK GUIDE) test strip USE T0 CHECK BLOOD SUGAR 3 TIMES DAILY 03/16/18  Yes  Angiulli, Lavon Paganini, PA-C  HYDROcodone-acetaminophen (NORCO) 10-325 MG tablet Take 1 tablet by mouth every 8 (eight) hours as needed. Patient taking differently: Take 1 tablet by mouth every 8 (eight) hours.  04/19/19  Yes [name], MD  Insulin Isophane & Regular Human (HUMULIN 70/30 MIX) (70-30) 100 UNIT/ML PEN Inject 70 Units into the skin daily with breakfast AND 35 Units daily with supper. 04/11/19  Yes Shamleffer, Melanie Crazier, MD  pantoprazole (PROTONIX) 40 MG tablet Take 1 tablet (40 mg total) by mouth daily. 03/16/18  Yes Angiulli, Lavon Paganini, PA-C  promethazine (PHENERGAN) 12.5 MG tablet Take 1 tablet (12.5 mg total) by mouth every 6 (six) hours as needed for nausea or vomiting. 05/25/19  Yes [name], MD  PROVENTIL HFA 108 [phone] Base) MCG/ACT inhaler Inhale 1-2 puffs into the lungs every 6 (six) hours as needed for wheezing or shortness of breath. Patient taking differently: Inhale 2 puffs into the lungs every 4 (four) hours as needed for wheezing.  05/11/19  Yes [name], MD  sulfamethoxazole-trimethoprim (BACTRIM DS) 800-160 MG tablet Take 1 tablet by mouth 2 (two) times daily.  05/30/19  Yes [provider]  insulin glargine (LANTUS) 100 UNIT/ML injection Inject 0.4 mLs (40 Units total) into the skin daily. Patient not taking: Reported on 06/20/2019 05/25/19   [name], MD  lactobacillus acidophilus (BACID) TABS tablet Take 2 tablets by mouth 3 (three) times daily. Patient not taking: Reported on 06/20/2019 05/25/19   [name], MD  Multiple Vitamin (MULTIVITAMIN WITH MINERALS) TABS tablet Take 1 tablet by mouth daily. Patient not taking: Reported on 06/20/2019 05/25/19   [name], MD  nutrition supplement, JUVEN, [facility]) PACK Take 1 packet by mouth 2 (two) times daily between meals. Patient not taking: Reported on 06/20/2019 05/25/19   [name], MD  nystatin cream (MYCOSTATIN) Apply topically 2 (two) times daily. Patient not taking: Reported on  06/20/2019 05/25/19   [name], MD  Oxycodone HCl 10 MG TABS Take 1 tablet (10 mg total) by mouth 2 (two) times daily as needed (Wound dressing Changes). Patient not taking: Reported on 06/20/2019 05/25/19   [name], MD  psyllium (HYDROCIL/METAMUCIL) 95 % PACK Take 1 packet by mouth  daily. Patient not taking: Reported on 06/20/2019 05/25/19   [name], MD    Family History Family History  Problem Relation Age of Onset  . Diabetes Mother   . Hyperlipidemia Mother   . Depression Mother   . Varicose Veins Mother   . Heart attack Paternal Uncle   . Heart disease Paternal Grandmother   . Heart attack Paternal Grandmother   . Heart attack Paternal Grandfather   . Heart disease Paternal Grandfather   . Heart attack Father   . Cancer Maternal Grandmother        COLON  . Hypertension Maternal Grandmother   . Hyperlipidemia Maternal Grandmother   . Diabetes Maternal Grandmother   . Other Maternal Grandfather        GUN SHOT    Social History Social History   Tobacco Use  . Smoking status: Former Smoker    Packs/day: 0.30    Years: 0.30    Pack years: 0.09    Types: Cigarettes    Quit date: 12/06/1993    Years since quitting: 25.5  . Smokeless tobacco: Never Used  Substance Use Topics  . Alcohol use: No    Alcohol/week: 0.0 standard drinks  . Drug use: Yes    Comment: OD attempts on home meds       Allergies   Kiwi extract, Cefepime, Trental [pentoxifylline], and Nubain [nalbuphine hcl]   Review of Systems Review of Systems  Constitutional: Negative for chills and fever.  HENT: Negative for ear pain and sore throat.   Eyes: Negative for pain and visual disturbance.  Respiratory: Negative for cough and shortness of breath.   Cardiovascular: Negative for chest pain and palpitations.  Gastrointestinal: Negative for abdominal pain and vomiting.  Genitourinary: Negative for dysuria and hematuria.  Musculoskeletal: Positive for arthralgias, gait problem and myalgias.  Negative for back pain.  Skin: Positive for wound. Negative for color change and rash.  Neurological: Negative for seizures, loss of consciousness and syncope.  All other systems reviewed and are negative.    Physical Exam Updated Vital Signs BP 134/79   Pulse (!) 104   Temp 98.7 F (37.1 C)   Resp 18   SpO2 98%   Physical Exam Vitals signs and nursing note reviewed.  Constitutional:      General: She is not in acute distress.    Appearance: She is well-developed. She is obese.  HENT:     Head: Normocephalic and atraumatic.     Nose: Nose normal.     Mouth/Throat:     Mouth: Mucous membranes are moist.  Eyes:     Extraocular Movements: Extraocular movements intact.     Conjunctiva/sclera: Conjunctivae normal.     Pupils: Pupils are equal, round, and reactive to light.  Neck:     Musculoskeletal: Neck supple.  Cardiovascular:     Rate and Rhythm: Normal rate and regular rhythm.     Pulses: Normal pulses.     Heart sounds: Normal heart sounds. No murmur.  Pulmonary:     Effort: Pulmonary effort is normal. No respiratory distress.     Breath sounds: Normal breath sounds.  Abdominal:     Palpations: Abdomen is soft.     Tenderness: There is no abdominal tenderness.  Musculoskeletal:        General: Tenderness (diffusely) present.  Skin:    General: Skin is warm and dry.     Comments: Surgical wound well packed, no purulent drainage, no surrounding cellulitis  Neurological:  General: No focal deficit present.     Mental Status: She is alert and oriented to person, place, and time.     Cranial Nerves: No cranial nerve deficit.     Motor: Weakness present.      ED Treatments / Results  Labs (all labs ordered are listed, but only abnormal results are displayed) Labs Reviewed  CBC WITH DIFFERENTIAL/PLATELET - Abnormal; Notable for the following components:      Result Value   WBC 13.2 (*)    RBC 3.40 (*)    Hemoglobin 9.9 (*)    HCT 30.8 (*)    Platelets  551 (*)    Lymphs Abs 4.1 (*)    Monocytes Absolute 1.1 (*)    Abs Immature Granulocytes 0.36 (*)    All other components within normal limits  BASIC METABOLIC PANEL - Abnormal; Notable for the following components:   Sodium 127 (*)    Chloride 94 (*)    CO2 21 (*)    Glucose, Bld 497 (*)    BUN 30 (*)    Creatinine, Ser 1.11 (*)    All other components within normal limits  URINALYSIS, ROUTINE W REFLEX MICROSCOPIC - Abnormal; Notable for the following components:   APPearance CLOUDY (*)    Glucose, UA >=500 (*)    Hgb urine dipstick LARGE (*)    Protein, ur 100 (*)    Leukocytes,Ua LARGE (*)    WBC, UA >50 (*)    Bacteria, UA RARE (*)    All other components within normal limits  HEPATIC FUNCTION PANEL - Abnormal; Notable for the following components:   Total Protein 8.7 (*)    Albumin 2.4 (*)    AST 9 (*)    Alkaline Phosphatase 203 (*)    Total Bilirubin 0.1 (*)    All other components within normal limits  URINE CULTURE  I-STAT BETA HCG BLOOD, ED ([facility], [facility], [facility] ONLY)    EKG None  Radiology No results found.  Procedures Procedures (including critical care time)  Medications Ordered in ED Medications  fluconazole (DIFLUCAN) tablet 150 mg (has no administration in time range)  HYDROmorphone (DILAUDID) injection 1 mg (1 mg Intravenous Given 06/20/19 0743)  sodium chloride 0.9 % bolus 1,000 mL (0 mLs Intravenous Stopped 06/20/19 0841)  sodium chloride 0.9 % bolus 250 mL (0 mLs Intravenous Stopped 06/20/19 1106)  HYDROcodone-acetaminophen (NORCO/VICODIN) 5-325 MG per tablet 2 tablet (2 tablets Oral Given 06/20/19 1118)     Initial Impression / Assessment and Plan / ED Course  I have reviewed the triage vital signs and the nursing notes.  Pertinent labs & imaging results that were available during my care of the patient were reviewed by me and considered in my medical decision making (see chart for details).       [name] is a 43 year old female history of  restless legs, drug-seeking behavior, morbid obesity, diabetes, chronic pain who presents to the ED with chronic pain flare.  Patient with normal vitals.  No fever.  Patient states that she has pain all over.  Does not give me any more specific details.  Had a recent I&D to his sacral abscess that looks like it is well-healing.  Overall suspect chronic pain process.  No specific chest pain, shortness of breath.  Has been compliant with her medications.  States that her home narcotics are not helping.  Will give dose of IV Dilaudid.  Will get basic labs, IV fluids.  Patient with mild hyponatremia  but otherwise lab work is unremarkable.  Hyponatremia likely from elevated blood sugar.  Patient given IV fluids for that.  However patient is not in DKA.  Overall work-up is unremarkable.  Patient found to have yeast infection and treated with Diflucan.  Recommend that she follow-up with her primary care provider to talk about her chronic pain regimen.  No acute findings today and patient was discharged in ED in good condition.  She has wound care come to the house to help her with her sacral wound.  She has a wheelchair at home that she uses.  This chart was dictated using voice recognition software.  Despite best efforts to proofread,  errors can occur which can change the documentation meaning.    Final Clinical Impressions(s) / ED Diagnoses   Final diagnoses:  Yeast infection    ED Discharge Orders    None       Lennice Sites, DO 06/20/19 1227

## 2019-06-20 NOTE — Telephone Encounter (Signed)
**  After Hours/ Emergency Line Call**  Received a call to report that [name] has been having pain and slight swelling to her left knee, left shoulder, left wrist and right index finger for the past week and has gotten progressively worse.  No erythema.  She denies any trauma or inciting event.  She has a left BKA and notes straightening her knee hurts.  She has trouble walking on it.   Denies any issues with her right leg.  She is on Bactrim for the past 3 weeks for her gluteal and pannus abscess that was I&D in the hospital in June.  She notes she will be on this for 1 month duration.  Was previously on Zosyn, clindamycin, vancomycin, cefazolin, doxycycline.  Notes wound is improving.  Still having some dysuria despite being on Bactrim for the past 3 weeks, was noted at telemedicine visit last week.  Did have a repeat UA and culture obtained via home health although we do not have these results yet.   Does note slight peeling to the palms of her hands but is not sure if this started before or after starting Bactrim.  Does state her lips are cracking and there is a sore inside her mouth.  Denies any rashes, fevers, sloughing of skin or bleeding.  Tolerating p.o. okay without nausea or vomiting.  She has an appointment tomorrow morning with wound clinic.     Multiple complaints with difficulty to fully assess without ability of physical exam.  Unclear etiology of her multi-joint pain and slight swelling.  Notes gluteal abscess is well-healing.  Blood cultures at time of hospitalization were negative and patient denies fever therefore unlikely infection has seeded to multiple joints.  History not consistent with trauma.  Per chart review was recommended for SNF at discharge but elected to return home and had been receiving home health PT, OT, wound RN.  Deconditioning after recent hospitalization could also be playing a role.  As patient still has dysuria despite being on Bactrim, it is likely resistant.  UA  and urine culture already obtained, recommend following up on these results to determine need for changing or additional antibiotics given lack of systemic symptoms.  Report of hand peeling, lips cracking and oral lesion is particularly concerning given patient is on Bactrim although she is unsure if this started before or after Bactrim initiation.  Able to tolerate p.o. and without any other systemic signs, sloughing of skin or bleeding.  Recommended holding further Bactrim doses until able to follow-up in clinic for physical exam.  Appointment made for tomorrow afternoon per patient request.  Has morning appointment with wound clinic. Red flags discussed and reasons to go to the ED prior to her clinic appointments.  Patient verbalized understanding.  Will forward to PCP.  Rory Percy, DO PGY-3, [facility] 06/20/2019 4:08 AM

## 2019-06-20 NOTE — ED Triage Notes (Signed)
Arrives via EMS from home, patient reports she cannot walk. Complains of pain all over, thinks it is from her baclofen. Also reports an abscess on her sacrum. Patient is very tearful.

## 2019-06-20 NOTE — ED Notes (Signed)
Discharge paperwork reviewed with pt, pt requesting ambulance transport home d/t no one able to pick her up, pt with BKA unable to transport via bus.

## 2019-06-20 NOTE — ED Notes (Signed)
[facility] notified of need for transport. 

## 2019-06-21 ENCOUNTER — Ambulatory Visit (INDEPENDENT_AMBULATORY_CARE_PROVIDER_SITE_OTHER): Payer: Medicaid Other | Admitting: Family Medicine

## 2019-06-21 ENCOUNTER — Other Ambulatory Visit: Payer: Self-pay

## 2019-06-21 ENCOUNTER — Encounter: Payer: Self-pay | Admitting: Family Medicine

## 2019-06-21 VITALS — BP 146/92 | HR 109

## 2019-06-21 DIAGNOSIS — M79641 Pain in right hand: Secondary | ICD-10-CM

## 2019-06-21 DIAGNOSIS — S78119A Complete traumatic amputation at level between unspecified hip and knee, initial encounter: Secondary | ICD-10-CM | POA: Diagnosis not present

## 2019-06-21 DIAGNOSIS — M25562 Pain in left knee: Secondary | ICD-10-CM | POA: Diagnosis not present

## 2019-06-21 DIAGNOSIS — E1065 Type 1 diabetes mellitus with hyperglycemia: Secondary | ICD-10-CM

## 2019-06-21 DIAGNOSIS — M25512 Pain in left shoulder: Secondary | ICD-10-CM | POA: Diagnosis not present

## 2019-06-21 DIAGNOSIS — G8929 Other chronic pain: Secondary | ICD-10-CM | POA: Diagnosis not present

## 2019-06-21 LAB — URINE CULTURE: Culture: NO GROWTH

## 2019-06-21 LAB — GLUCOSE, POCT (MANUAL RESULT ENTRY): POC Glucose: 424 mg/dl — AB (ref 70–99)

## 2019-06-21 MED ORDER — MORPHINE SULFATE (PF) 10 MG/ML IV SOLN
Freq: Once | INTRAVENOUS | Status: AC
  Administered 2019-06-21: 2 mg via INTRAMUSCULAR

## 2019-06-21 NOTE — Patient Instructions (Addendum)
It was great seeing you today!  I am sorry you are having such bad pain recently.  I would like to check your right hand, left knee, and left shoulder for x-rays given your pain.  I have not seen a recent x-rays for this in our system.  Your right hand pain is especially concerning given that he cannot flex or extend the index finger.  Your glucose is 424.  While this is very high likely it is not in so severe range is causing you to have diabetic ketoacidosis.  I do recommend you to follow-up with your endocrinologist as soon as possible for further management.  Gave you a dose of morphine while you are here.  You can increase the frequency of the Percocet to every 6 hours.

## 2019-06-22 ENCOUNTER — Emergency Department (HOSPITAL_COMMUNITY): Payer: Medicaid Other

## 2019-06-22 ENCOUNTER — Other Ambulatory Visit: Payer: Self-pay

## 2019-06-22 ENCOUNTER — Encounter (HOSPITAL_COMMUNITY): Payer: Self-pay

## 2019-06-22 ENCOUNTER — Emergency Department (HOSPITAL_COMMUNITY)
Admission: EM | Admit: 2019-06-22 | Discharge: 2019-06-22 | Discharge status: Against Medical Advice | Payer: Medicaid Other | Attending: Emergency Medicine | Admitting: Emergency Medicine

## 2019-06-22 DIAGNOSIS — M25512 Pain in left shoulder: Secondary | ICD-10-CM | POA: Insufficient documentation

## 2019-06-22 DIAGNOSIS — E785 Hyperlipidemia, unspecified: Secondary | ICD-10-CM | POA: Diagnosis not present

## 2019-06-22 DIAGNOSIS — Z794 Long term (current) use of insulin: Secondary | ICD-10-CM | POA: Insufficient documentation

## 2019-06-22 DIAGNOSIS — Z03818 Encounter for observation for suspected exposure to other biological agents ruled out: Secondary | ICD-10-CM | POA: Diagnosis not present

## 2019-06-22 DIAGNOSIS — Z87891 Personal history of nicotine dependence: Secondary | ICD-10-CM | POA: Diagnosis not present

## 2019-06-22 DIAGNOSIS — J449 Chronic obstructive pulmonary disease, unspecified: Secondary | ICD-10-CM | POA: Diagnosis not present

## 2019-06-22 DIAGNOSIS — I10 Essential (primary) hypertension: Secondary | ICD-10-CM | POA: Insufficient documentation

## 2019-06-22 DIAGNOSIS — M7918 Myalgia, other site: Secondary | ICD-10-CM | POA: Diagnosis present

## 2019-06-22 DIAGNOSIS — M79601 Pain in right arm: Secondary | ICD-10-CM | POA: Diagnosis not present

## 2019-06-22 DIAGNOSIS — Z89512 Acquired absence of left leg below knee: Secondary | ICD-10-CM | POA: Insufficient documentation

## 2019-06-22 DIAGNOSIS — R079 Chest pain, unspecified: Secondary | ICD-10-CM | POA: Diagnosis not present

## 2019-06-22 DIAGNOSIS — M7532 Calcific tendinitis of left shoulder: Secondary | ICD-10-CM | POA: Diagnosis not present

## 2019-06-22 DIAGNOSIS — Z79899 Other long term (current) drug therapy: Secondary | ICD-10-CM | POA: Insufficient documentation

## 2019-06-22 DIAGNOSIS — M19012 Primary osteoarthritis, left shoulder: Secondary | ICD-10-CM | POA: Diagnosis not present

## 2019-06-22 DIAGNOSIS — M25531 Pain in right wrist: Secondary | ICD-10-CM | POA: Insufficient documentation

## 2019-06-22 DIAGNOSIS — E1165 Type 2 diabetes mellitus with hyperglycemia: Secondary | ICD-10-CM | POA: Insufficient documentation

## 2019-06-22 DIAGNOSIS — R739 Hyperglycemia, unspecified: Secondary | ICD-10-CM

## 2019-06-22 DIAGNOSIS — N390 Urinary tract infection, site not specified: Secondary | ICD-10-CM

## 2019-06-22 DIAGNOSIS — R457 State of emotional shock and stress, unspecified: Secondary | ICD-10-CM | POA: Diagnosis not present

## 2019-06-22 DIAGNOSIS — R2231 Localized swelling, mass and lump, right upper limb: Secondary | ICD-10-CM | POA: Insufficient documentation

## 2019-06-22 DIAGNOSIS — M79641 Pain in right hand: Secondary | ICD-10-CM | POA: Diagnosis not present

## 2019-06-22 LAB — CBC WITH DIFFERENTIAL/PLATELET
Abs Immature Granulocytes: 0.23 10*3/uL — ABNORMAL HIGH (ref 0.00–0.07)
Basophils Absolute: 0.1 10*3/uL (ref 0.0–0.1)
Basophils Relative: 1 %
Eosinophils Absolute: 0.2 10*3/uL (ref 0.0–0.5)
Eosinophils Relative: 2 %
HCT: 29.3 % — ABNORMAL LOW (ref 36.0–46.0)
Hemoglobin: 9.6 g/dL — ABNORMAL LOW (ref 12.0–15.0)
Immature Granulocytes: 2 %
Lymphocytes Relative: 31 %
Lymphs Abs: 3.9 10*3/uL (ref 0.7–4.0)
MCH: 29.4 pg (ref 26.0–34.0)
MCHC: 32.8 g/dL (ref 30.0–36.0)
MCV: 89.6 fL (ref 80.0–100.0)
Monocytes Absolute: 1.3 10*3/uL — ABNORMAL HIGH (ref 0.1–1.0)
Monocytes Relative: 10 %
Neutro Abs: 6.9 10*3/uL (ref 1.7–7.7)
Neutrophils Relative %: 54 %
Platelets: 535 10*3/uL — ABNORMAL HIGH (ref 150–400)
RBC: 3.27 MIL/uL — ABNORMAL LOW (ref 3.87–5.11)
RDW: 14.4 % (ref 11.5–15.5)
WBC: 12.5 10*3/uL — ABNORMAL HIGH (ref 4.0–10.5)
nRBC: 0 % (ref 0.0–0.2)

## 2019-06-22 LAB — COMPREHENSIVE METABOLIC PANEL
ALT: 14 U/L (ref 0–44)
AST: 10 U/L — ABNORMAL LOW (ref 15–41)
Albumin: 2.6 g/dL — ABNORMAL LOW (ref 3.5–5.0)
Alkaline Phosphatase: 165 U/L — ABNORMAL HIGH (ref 38–126)
Anion gap: 12 (ref 5–15)
BUN: 30 mg/dL — ABNORMAL HIGH (ref 6–20)
CO2: 21 mmol/L — ABNORMAL LOW (ref 22–32)
Calcium: 9.2 mg/dL (ref 8.9–10.3)
Chloride: 94 mmol/L — ABNORMAL LOW (ref 98–111)
Creatinine, Ser: 0.97 mg/dL (ref 0.44–1.00)
GFR calc Af Amer: >60 mL/min (ref >60)
GFR calc non Af Amer: >60 mL/min (ref >60)
Glucose, Bld: 494 mg/dL — ABNORMAL HIGH (ref 70–99)
Potassium: 5.3 mmol/L — ABNORMAL HIGH (ref 3.5–5.1)
Sodium: 127 mmol/L — ABNORMAL LOW (ref 135–145)
Total Bilirubin: 0.2 mg/dL — ABNORMAL LOW (ref 0.3–1.2)
Total Protein: 8.7 g/dL — ABNORMAL HIGH (ref 6.5–8.1)

## 2019-06-22 LAB — URINALYSIS, ROUTINE W REFLEX MICROSCOPIC
Bilirubin Urine: NEGATIVE
Glucose, UA: >=500 mg/dL — AB
Ketones, ur: NEGATIVE mg/dL
Nitrite: NEGATIVE
Protein, ur: 100 mg/dL — AB
RBC / HPF: >50 RBC/hpf — ABNORMAL HIGH (ref 0–5)
Specific Gravity, Urine: 1.021 (ref 1.005–1.030)
WBC, UA: >50 WBC/hpf — ABNORMAL HIGH (ref 0–5)
pH: 5 (ref 5.0–8.0)

## 2019-06-22 LAB — I-STAT BETA HCG BLOOD, ED (MC, WL, AP ONLY): I-stat hCG, quantitative: <5 m[IU]/mL (ref <5)

## 2019-06-22 LAB — C-REACTIVE PROTEIN: CRP: 16.7 mg/dL — ABNORMAL HIGH (ref <1.0)

## 2019-06-22 LAB — CBG MONITORING, ED
Glucose-Capillary: 436 mg/dL — ABNORMAL HIGH (ref 70–99)
Glucose-Capillary: 463 mg/dL — ABNORMAL HIGH (ref 70–99)
Glucose-Capillary: 390 mg/dL — ABNORMAL HIGH (ref 70–99)

## 2019-06-22 LAB — SEDIMENTATION RATE: Sed Rate: >140 mm/hr — ABNORMAL HIGH (ref 0–22)

## 2019-06-22 LAB — SARS CORONAVIRUS 2 BY RT PCR (HOSPITAL ORDER, PERFORMED IN ~~LOC~~ HOSPITAL LAB): SARS Coronavirus 2: NEGATIVE

## 2019-06-22 MED ORDER — INSULIN ASPART 100 UNIT/ML ~~LOC~~ SOLN
10.0000 [IU] | Freq: Once | SUBCUTANEOUS | Status: AC
  Administered 2019-06-22: 10 [IU] via SUBCUTANEOUS
  Filled 2019-06-22: qty 0.1

## 2019-06-22 MED ORDER — ACETAMINOPHEN 325 MG PO TABS
650.0000 mg | ORAL_TABLET | Freq: Once | ORAL | Status: AC
  Administered 2019-06-22: 650 mg via ORAL
  Filled 2019-06-22: qty 2

## 2019-06-22 MED ORDER — HYDROMORPHONE HCL 1 MG/ML IJ SOLN
1.0000 mg | Freq: Once | INTRAMUSCULAR | Status: AC
  Administered 2019-06-22: 1 mg via INTRAVENOUS
  Filled 2019-06-22: qty 1

## 2019-06-22 MED ORDER — SODIUM CHLORIDE 0.9 % IV BOLUS
1000.0000 mL | Freq: Once | INTRAVENOUS | Status: AC
  Administered 2019-06-22: 1000 mL via INTRAVENOUS

## 2019-06-22 MED ORDER — SODIUM CHLORIDE 0.9 % IV SOLN
1.0000 g | Freq: Once | INTRAVENOUS | Status: AC
  Administered 2019-06-22: 1 g via INTRAVENOUS
  Filled 2019-06-22: qty 10

## 2019-06-22 NOTE — ED Notes (Signed)
ED Provider at bedside. 

## 2019-06-22 NOTE — Progress Notes (Signed)
[facility] ED TOC CM -referral 5 ED visits/1 readmission in six months   Pt left AMA. Unable to speak to pt. Jonnie Finner RN CCM Case Mgmt phone [phone]

## 2019-06-22 NOTE — ED Triage Notes (Signed)
Pt arrived with complaints of gerneralized sharp pain for a couple of weeks. Pt seen on 06/20/2019 with no relief. Pt given Morphine today by her primary care provider and takes Gabapentin at home but stating nothing is helping.

## 2019-06-22 NOTE — Telephone Encounter (Signed)
Patient is currently in the ED and it appears she is going to be admitted. Will await outcomes of hospitalization as I suspect they will order physical therapy on discharge for her Thanks [name], MD

## 2019-06-22 NOTE — ED Provider Notes (Cosign Needed)
[facility] DEPT Provider Note   CSN: [id number] Arrival date & time: 06/22/19  0019    History   Chief Complaint Chief Complaint  Patient presents with  . Generalized Body Aches    HPI [name] is a 43 y.o. 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  The patient endorses pain and swelling in her right second finger that began approximately a week ago.  She reports that the pain and swelling has since spread to her entire right hand and right wrist.  She reports that she is also started having significant, severe pain in her left shoulder.  She reports that she is unable to move her right wrist due to the pain.  She has been taking her home Norco without improvement.  She was seen by her PCP this morning for the same and was given IM morphine with no improvement in her symptoms.  She reports that her blood sugars have been running high.  However, she has had no fever, chills, redness or warmth to the joints, right elbow or shoulder pain, neck pain, back pain, chest pain, or abdominal pain.  The patient reports she did have an IV in her right hand recently.  No family history of RA.  She does report a family history of fibromyalgia.  No history of polyarthralgia.  She was recently diagnosed with a yeast infection, but has no history of gonorrhea.     The history is provided by the patient. No language interpreter was used.    Past Medical History:  Diagnosis Date  . Acute osteomyelitis of ankle or foot, left ([place]) 01/31/2018  . Alveolar hypoventilation   . Anemia    not on iron pill  . Asthma   . Bipolar 2 disorder ([place])   . Carpal tunnel syndrome on right    recurrent  . Cellulitis 08/2010-08/2011  . Chronic pain   . COPD (chronic obstructive pulmonary  disease) ([name])    Symbicort daily and Proventil as needed  . Costochondritis   . Depression   . Diabetes mellitus type II, uncontrolled ([place]) 2000   Type 2, Uncontrolled.Takes Lantus daily.Fasting blood sugar runs 150  . Dizziness    occasionally  . Drug-seeking behavior   . GERD (gastroesophageal reflux disease)    takes Pantoprazole and Zantac daily  . Headache    migraine-last one about a yr ago.Topamax daily  . HLD (hyperlipidemia)    takes Atorvastatin daily  . Hypertension    takes Lisinopril and Coreg daily  . Morbid obesity ([place])   . Muscle spasm    takes Flexeril as needed  . Nocturia   . OSA on CPAP   . Peripheral neuropathy    takes Gabapentin daily  . Pneumonia    "walking" several yrs ago and as a baby (12/05/2018)  . Rectal fissure   . Restless leg   . Syncope 02/25/2016  . Urinary frequency   . Varicose veins    Right medial thigh and Left leg     Patient Active Problem List   Diagnosis Date Noted  . Dysuria 06/14/2019  . Abdominal pain   . Fever   . Abscess of skin of abdomen   . Abscess of buttock, right 05/17/2019  . Hyperglycemia   . Severe protein-calorie malnutrition ([place])   . Suspected  Covid-19 Virus Infection 05/11/2019  . Amputated toe, right ([place]) 04/11/2019  . 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[place]) 04/11/2019  . Hypertriglyceridemia 04/11/2019  . Type 2 diabetes mellitus with diabetic polyneuropathy, with long-term current use of insulin ([place]) 04/11/2019  . Type 2 diabetes mellitus with hyperglycemia, with long-term current use of insulin ([place]) 04/11/2019  . Infection of right foot   . Cellulitis of great toe of right foot 10/29/2018  . Osteomyelitis of great toe of right foot ([place]) 10/29/2018  . Encounter for orthopedic aftercare following surgical amputation 06/28/2018  . Uncontrolled type 2 diabetes mellitus with insulin therapy ([place]) 04/16/2018  . Amputation stump infection ([place]) 04/09/2018  . Allergic rhinitis 03/06/2018  . Unilateral  complete BKA, left, sequela ([place])   . Class 3 severe obesity due to excess calories with serious comorbidity and body mass index (BMI) of 50.0 to 59.9 in adult [facility])   . AKI (acute kidney injury) ([place])   . Decreased pedal pulses 06/10/2017  . Unilateral primary osteoarthritis, right knee 10/22/2016  . Primary osteoarthritis of first carpometacarpal joint of left hand 07/30/2016  . Diabetic neuropathy ([place]) 07/14/2016  . Nausea 03/17/2016  . De Quervain's tenosynovitis, bilateral 11/01/2015  . Vitamin D deficiency 09/05/2015  . Recurrent candidiasis of vagina 09/05/2015  . Varicose veins of leg with complications 62/94/7654  . Restless leg syndrome 10/17/2014  . Chronic sinusitis 07/18/2014  . Headache 07/15/2014  . Encounter for chronic pain management 06/30/2013  . HLD (hyperlipidemia) 11/19/2012  . Chest pain 06/27/2012  . Right carpal tunnel syndrome 09/01/2011  . Bilateral knee pain 09/01/2011  . DM (diabetes mellitus) type II uncontrolled, periph vascular disorder ([place]) 05/22/2008  . Morbid obesity ([place]) 05/22/2008  . 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 Depression 05/22/2008  . Obstructive sleep apnea 05/22/2008  . Hypertension 05/22/2008  . Asthma 05/22/2008  . GERD 05/22/2008    Past Surgical History:  Procedure Laterality Date  . AMPUTATION Left 02/01/2018   Procedure: LEFT FOURTH AND 5TH TOE RAY AMPUTATION;  Surgeon: [name], MD;  Location: [facility];  Service: Orthopedics;  Laterality: Left;  . AMPUTATION Left 03/03/2018   Procedure: LEFT BELOW KNEE AMPUTATION;  Surgeon: [name], MD;  Location: [facility];  Service: Orthopedics;  Laterality: Left;  . CARPAL TUNNEL RELEASE Bilateral   . CESAREAN SECTION  2007  . INCISION AND DRAINAGE PERIRECTAL ABSCESS Left 05/18/2019   Procedure: IRRIGATION AND DEBRIDEMENT OF PANNIS ABSCESS, POSSIBLE DEBRIDEMENT OF BUTTOCK WOUND;  Surgeon: Donnie Mesa, MD;  Location: [facility];  Service: General;  Laterality: Left;  .  IRRIGATION AND DEBRIDEMENT BUTTOCKS Left 05/17/2019   Procedure: IRRIGATION AND DEBRIDEMENT BUTTOCKS;  Surgeon: Donnie Mesa, MD;  Location: [facility];  Service: General;  Laterality: Left;  . KNEE ARTHROSCOPY Right 07/17/2010  . LEFT HEART CATHETERIZATION WITH CORONARY ANGIOGRAM N/A 07/27/2012   Procedure: LEFT HEART CATHETERIZATION WITH CORONARY ANGIOGRAM;  Surgeon: Sherren Mocha, MD;  Location: [facility] C[facility] LAB;  Service: Cardiovascular;  Laterality: N/A;  . MASS EXCISION N/A 06/29/2013   Procedure:  WIDE LOCAL EXCISION OF POSTERIOR NECK ABSCESS;  Surgeon: Ralene Ok, MD;  Location: [facility];  Service: General;  Laterality: N/A;  . REPAIR KNEE LIGAMENT Left    "fixed ligaments and chipped patella"  . right transmetatarsal amputation        OB History    Gravida  2   Para  1   Term      Preterm      AB  1  Living  1     SAB  1   TAB      Ectopic      Multiple      Live Births               Home Medications    Prior to Admission medications   Medication Sig Start Date End Date Taking? Authorizing Provider  ACCU-CHEK SOFTCLIX LANCETS lancets Use as instructed to check blood sugar 3 times per day 03/16/18   Angiulli, Lavon Paganini, PA-C  amLODipine (NORVASC) 10 MG tablet Take 1 tablet (10 mg total) by mouth daily. 05/25/19   [name], MD  atorvastatin (LIPITOR) 20 MG tablet Take 1 tablet (20 mg total) by mouth daily. 03/16/18   Angiulli, Lavon Paganini, PA-C  Blood Glucose Monitoring Suppl (ACCU-CHEK NANO SMARTVIEW) w/Device KIT Use to check blood sugar 3 times daily 03/16/18   Angiulli, Lavon Paganini, PA-C  budesonide-formoterol (SYMBICORT) 160-4.5 MCG/ACT inhaler inhale 2 PUFFS into THE lungs 2 TIMES DAILY Patient taking differently: Inhale 2 puffs into the lungs 2 (two) times daily.  10/10/18   [name], MD  Cholecalciferol 1000 units tablet Take 1 tablet (1,000 Units total) by mouth daily. 04/05/17   [name], MD  collagenase (SANTYL) ointment Apply topically daily.  05/25/19   [name], MD  cyclobenzaprine (FLEXERIL) 5 MG tablet TAKE 1 TABLET BY MOUTH 3 TIMES DAILY AS NEEDED FOR MUSCLE SPASMS Patient taking differently: Take 5 mg by mouth 3 (three) times daily as needed for muscle spasms.  05/09/19   [name], MD  Ensure Max Protein (ENSURE MAX PROTEIN) LIQD Take 330 mLs (11 oz total) by mouth daily. 05/25/19   [name], MD  gabapentin (NEURONTIN) 600 MG tablet TAKE 2 TABLETS BY MOUTH 3 TIMES DAILY Patient taking differently: Take 1,200 mg by mouth 3 (three) times daily.  04/26/19   [name], MD  glucose blood (ACCU-CHEK GUIDE) test strip USE T0 CHECK BLOOD SUGAR 3 TIMES DAILY 03/16/18   Angiulli, Lavon Paganini, PA-C  HYDROcodone-acetaminophen (NORCO) 10-325 MG tablet Take 1 tablet by mouth every 8 (eight) hours as needed. Patient taking differently: Take 1 tablet by mouth every 8 (eight) hours.  04/19/19   [name], MD  insulin glargine (LANTUS) 100 UNIT/ML injection Inject 0.4 mLs (40 Units total) into the skin daily. Patient not taking: Reported on 06/20/2019 05/25/19   [name], MD  Insulin Isophane & Regular Human (HUMULIN 70/30 MIX) (70-30) 100 UNIT/ML PEN Inject 70 Units into the skin daily with breakfast AND 35 Units daily with supper. 04/11/19   Shamleffer, Melanie Crazier, MD  lactobacillus acidophilus (BACID) TABS tablet Take 2 tablets by mouth 3 (three) times daily. Patient not taking: Reported on 06/20/2019 05/25/19   [name], MD  Multiple Vitamin (MULTIVITAMIN WITH MINERALS) TABS tablet Take 1 tablet by mouth daily. Patient not taking: Reported on 06/20/2019 05/25/19   [name], MD  nutrition supplement, JUVEN, [facility]) PACK Take 1 packet by mouth 2 (two) times daily between meals. Patient not taking: Reported on 06/20/2019 05/25/19   [name], MD  nystatin cream (MYCOSTATIN) Apply topically 2 (two) times daily. Patient not taking: Reported on 06/20/2019 05/25/19   [name], MD  Oxycodone HCl 10 MG TABS  Take 1 tablet (10 mg total) by mouth 2 (two) times daily as needed (Wound dressing Changes). Patient not taking: Reported on 06/20/2019 05/25/19   [name], MD  pantoprazole (PROTONIX) 40 MG tablet Take 1  tablet (40 mg total) by mouth daily. 03/16/18   Angiulli, Lavon Paganini, PA-C  promethazine (PHENERGAN) 12.5 MG tablet Take 1 tablet (12.5 mg total) by mouth every 6 (six) hours as needed for nausea or vomiting. 05/25/19   [name], MD  PROVENTIL HFA 108 [phone] Base) MCG/ACT inhaler Inhale 1-2 puffs into the lungs every 6 (six) hours as needed for wheezing or shortness of breath. Patient taking differently: Inhale 2 puffs into the lungs every 4 (four) hours as needed for wheezing.  05/11/19   [name], MD  psyllium (HYDROCIL/METAMUCIL) 95 % PACK Take 1 packet by mouth daily. Patient not taking: Reported on 06/20/2019 05/25/19   [name], MD  sulfamethoxazole-trimethoprim (BACTRIM DS) 800-160 MG tablet Take 1 tablet by mouth 2 (two) times daily.  05/30/19   [provider]    Family History Family History  Problem Relation Age of Onset  . Diabetes Mother   . Hyperlipidemia Mother   . Depression Mother   . Varicose Veins Mother   . Heart attack Paternal Uncle   . Heart disease Paternal Grandmother   . Heart attack Paternal Grandmother   . Heart attack Paternal Grandfather   . Heart disease Paternal Grandfather   . Heart attack Father   . Cancer Maternal Grandmother        COLON  . Hypertension Maternal Grandmother   . Hyperlipidemia Maternal Grandmother   . Diabetes Maternal Grandmother   . Other Maternal Grandfather        GUN SHOT    Social History Social History   Tobacco Use  . Smoking status: Former Smoker    Packs/day: 0.30    Years: 0.30    Pack years: 0.09    Types: Cigarettes    Quit date: 12/06/1993    Years since quitting: 25.5  . Smokeless tobacco: Never Used  Substance Use Topics  . Alcohol use: No    Alcohol/week: 0.0 standard drinks  . Drug  use: Yes    Comment: OD attempts on home meds       Allergies   Kiwi extract, Cefepime, Trental [pentoxifylline], and Nubain [nalbuphine hcl]   Review of Systems Review of Systems  Constitutional: Negative for activity change, chills and fever.  Respiratory: Negative for shortness of breath.   Cardiovascular: Negative for chest pain.  Gastrointestinal: Negative for abdominal pain.  Genitourinary: Negative for dysuria.  Musculoskeletal: Positive for arthralgias, joint swelling and myalgias. Negative for back pain.  Skin: Negative for rash.  Allergic/Immunologic: Negative for immunocompromised state.  Neurological: Negative for dizziness, weakness, numbness and headaches.  Psychiatric/Behavioral: Negative for confusion.   Physical Exam Updated Vital Signs BP 129/80   Pulse 92   Temp 98.2 F (36.8 C) (Oral)   Resp 13   Ht _0  (1.575 m)   Wt 134.3 kg   LMP  (LMP Unknown)   SpO2 97%   BMI 54.14 kg/m   Physical Exam Vitals signs and nursing note reviewed.  Constitutional:      General: She is not in acute distress.    Appearance: She is obese.     Comments: Chronically ill appearing.  Tearful. NAD.   HENT:     Head: Normocephalic.  Eyes:     Conjunctiva/sclera: Conjunctivae normal.  Neck:     Musculoskeletal: Neck supple.  Cardiovascular:     Rate and Rhythm: Normal rate and regular rhythm.     Heart sounds: No murmur. No friction rub. No gallop.   Pulmonary:  Effort: Pulmonary effort is normal. No respiratory distress.  Abdominal:     General: There is no distension.     Palpations: Abdomen is soft.     Comments: Abscess noted to the mid lower abdominal wall.  Packing is in place.  Wound is well-healing.  Musculoskeletal:     Comments: Sacral wound with packing in place that appears well-healing.  Significantly tender to palpation diffusely to the right wrist and dorsum of the right hand.  The second digit of the right hand is also diffusely tender to  palpation.  These areas are also edematous, but there is no redness or warmth.  Range of motion is significantly decreased due to pain.  No tenderness to digits 1, 3, 4, or 5.  No tenderness to the right elbow or shoulder with full range of motion  Diffusely tender palpation over the left shoulder.  No tenderness over the left clavicle.  No tenderness to the cervical or thoracic spinous processes or bilateral paraspinal muscles.  Normal exam and full range of motion of the left elbow and wrist.  No tenderness to the bilateral hips or knees.  She has a left BKA.   She is neurovascular intact.   Skin:    General: Skin is warm.     Findings: No rash.  Neurological:     Mental Status: She is alert.  Psychiatric:        Behavior: Behavior normal.      ED Treatments / Results  Labs (all labs ordered are listed, but only abnormal results are displayed) Labs Reviewed  SEDIMENTATION RATE - Abnormal; Notable for the following components:      Result Value   Sed Rate >140 (*)    All other components within normal limits  C-REACTIVE PROTEIN - Abnormal; Notable for the following components:   CRP 16.7 (*)    All other components within normal limits  CBC WITH DIFFERENTIAL/PLATELET - Abnormal; Notable for the following components:   WBC 12.5 (*)    RBC 3.27 (*)    Hemoglobin 9.6 (*)    HCT 29.3 (*)    Platelets 535 (*)    Monocytes Absolute 1.3 (*)    Abs Immature Granulocytes 0.23 (*)    All other components within normal limits  COMPREHENSIVE METABOLIC PANEL - Abnormal; Notable for the following components:   Sodium 127 (*)    Potassium 5.3 (*)    Chloride 94 (*)    CO2 21 (*)    Glucose, Bld 494 (*)    BUN 30 (*)    Total Protein 8.7 (*)    Albumin 2.6 (*)    AST 10 (*)    Alkaline Phosphatase 165 (*)    Total Bilirubin 0.2 (*)    All other components within normal limits  CBG MONITORING, ED - Abnormal; Notable for the following components:   Glucose-Capillary 436 (*)    All  other components within normal limits  CBG MONITORING, ED - Abnormal; Notable for the following components:   Glucose-Capillary 463 (*)    All other components within normal limits  I-STAT BETA HCG BLOOD, ED ([facility], [facility], [facility] ONLY)    EKG EKG Interpretation  Date/Time:  Friday June 22 2019 06:47:46 EDT Ventricular Rate:  93 PR Interval:    QRS Duration: 103 QT Interval:  358 QTC Calculation: 446 R Axis:   108 Text Interpretation:  Sinus rhythm Right axis deviation No significant change was found Confirmed by Molpus, John [phone]) on 06/22/2019  6:53:07 AM   Radiology Dg Wrist Complete Right  Result Date: 06/22/2019 CLINICAL DATA:  Atraumatic right hand and wrist pain EXAM: RIGHT WRIST - COMPLETE 3+ VIEW COMPARISON:  None. FINDINGS: There is no evidence of fracture or dislocation. There is no evidence of arthropathy or other focal bone abnormality. Soft tissues are unremarkable. Premature arterial calcification IMPRESSION: 1. No acute or erosive finding. 2. Premature arterial calcification. Electronically Signed   By: Monte Fantasia M.D.   On: 06/22/2019 06:28   Dg Shoulder Left  Result Date: 06/22/2019 CLINICAL DATA:  Atraumatic left shoulder pain EXAM: LEFT SHOULDER - 2+ VIEW COMPARISON:  None. FINDINGS: Nodular calcification at the rotator cuff. Degenerative spurring at the [facility] joint projecting inferiorly. Enthesophyte formation also seen at the greater tuberosity. No fracture or erosion. IMPRESSION: 1. Calcific tendinitis of the rotator cuff. 2. Acromioclavicular degenerative spurring. Electronically Signed   By: Monte Fantasia M.D.   On: 06/22/2019 06:29    Procedures Procedures (including critical care time)  Medications Ordered in ED Medications  sodium chloride 0.9 % bolus 1,000 mL (has no administration in time range)  acetaminophen (TYLENOL) tablet 650 mg (650 mg Oral Given 06/22/19 0412)  HYDROmorphone (DILAUDID) injection 1 mg (1 mg Intravenous Given 06/22/19 0551)  sodium  chloride 0.9 % bolus 1,000 mL (1,000 mLs Intravenous New Bag/Given 06/22/19 0650)  insulin aspart (novoLOG) injection 10 Units (10 Units Subcutaneous Given 06/22/19 0650)     Initial Impression / Assessment and Plan / ED Course  I have reviewed the triage vital signs and the nursing notes.  Pertinent labs & imaging results that were available during my care of the patient were reviewed by me and considered in my medical decision making (see chart for details).        43 year old female with a history of uncontrolled diabetes mellitus type 2, osteomyelitis of the left ankle s/p left BKA, drug-seeking behavior, morbid obesity, and COPD presenting with polyarthralgias.  Symptoms began approximately 1 week ago in her right index finger before spreading to the dorsum of the right hand and right wrist.  She reports that she developed left shoulder pain that began several days later.  No constitutional symptoms.  She did have an IV in her right hand recently.  Given her symptoms and history of osteomyelitis in the setting of poorly controlled diabetes, I am concerned for infectious process, including septic arthritis or recurrent osteomyelitis.  However, rheumatological process, including rheumatoid arthritis could also be considered.  Less likely gout.  She was seen at her PCPs office earlier in the day and was found to be hyperglycemic.  Glucose in the ER is 494.  Bicarb is minimally decreased at 21 and anion gap is normal.  Labs are also notable for hyponatremia of 127 she is mildly hyperkalemic at 5.3.  EKG has been ordered.  Will treat with IV fluids and 10 units of NovoLog.  Although she has a mild leukocytosis of 12.5, this is improved from previous.  CRP is 16.7, significantly elevated from previous, but the patient does also have wounds to the abdomen and low back that are currently being treated by wound care, which could account for this elevation.  ESR is pending.  Will order MRI of the right  upper extremity.  Patient was extremely tearful and complaining of severe pain on arrival despite her home medications and IM morphine earlier in the day.  Dilaudid given for pain control.  Patient care transferred to [facility] at the end of  my shift. Patient presentation, ED course, and plan of care discussed with review of all pertinent labs and imaging. Please see his/her note for further details regarding further ED course and disposition.   Final Clinical Impressions(s) / ED Diagnoses   Final diagnoses:  None    ED Discharge Orders    None       Janaysia Mcleroy A, PA-C 06/22/19 0740

## 2019-06-22 NOTE — ED Notes (Signed)
Pts family is bedside helping to change patient and transfer to wheelchair for her [place] discharge.

## 2019-06-22 NOTE — ED Provider Notes (Signed)
  Face-to-face evaluation   History: She presents for evaluation of ongoing pain in right hand, fingers, and right wrist.  Onset of the symptoms several days ago.  She saw her PCP yesterday who ordered some images, but they were not done yet.  Treated earlier today in the ED, and had imaging done.  An MRI has been ordered but has not been performed yet because of limitation of size capability.  Currently patient is alert and complaining of pain in the right hand.  She denies trauma.  Physical exam: Morbidly obese, alert and cooperative.  No respiratory distress.  Right dorsum hand, adjacent to the wrist region, with mild swelling and erythema.  No distinct crepitation at the site with gentle movement of wrist and fingers.  She has exquisite pain in her right index finger, and the right hand at the area of redness.  No proximal streaking.  No tenderness of the right elbow.   Medical screening examination/treatment/procedure(s) were conducted as a shared visit with non-physician practitioner(s) and myself.  I personally evaluated the patient during the encounter    Daleen Bo, MD 06/22/19 1733

## 2019-06-22 NOTE — ED Notes (Addendum)
Domenic Moras, PA aware [phone]

## 2019-06-22 NOTE — ED Notes (Signed)
Patient transported to MRI 

## 2019-06-22 NOTE — ED Notes (Addendum)
Pt informed RN that her mother is picking her up and taking her to [facility]. Pt states she is leaving AMA and is refusing all care here at [facility]. Pt informed by RN and Domenic Moras, PA that we have the resources available to take care of her, pt is currently awaiting transfer to [facility] for further evaluation and MRI imaging. Pt aware of this information and insists on leaving. Pt states that her mother is bringing her prosthesis for her RBKA and that pt is able to stand and pivot to transfer from wheelchair to mothers car.

## 2019-06-22 NOTE — ED Provider Notes (Cosign Needed Addendum)
7:59 AM Received signout at the beginning of shift.  Patient with history of uncontrolled diabetes, who presents complaining of pain primarily to her right wrist and right index finger as well as left shoulder ongoing for more than a week.  On exam, she has exquisite tenderness about the right wrist and mild edema and swelling noted to the dorsum of her right hand.  She did report recent Angiocath insertion in the affected area.  However given the severity of her discomfort and her decreased range of motion, we felt an MRI of her wrist will be beneficial to rule out osteomyelitis or other deep infection.  Patient was found to be hyperglycemic with a CBG greater than 400.  Normal anion gap no evidence of DKA.  She also complaining of mild urinary discomfort.  Her urine does appear cloudy, will obtain UA.  We will continue with pain management and close monitoring.  10:09 AM An MRI of the right wrist was ordered however patient body habitus is not compatible with our current MRI machine.  I did discuss care with Dr. Eulis Foster, who will evaluate patient.  Also, patient's UA shows evidence concerning for urinary tract infection, will treat with antibiotic.  11:40 AM THis is a Family Medicine pt, however, FM resident request [facility] to admit pt.  Will consult for admission.  Care discussed with DR. Wentz.   12:01 PM Appreciate consultation from [facility] who agrees to see and admit pt. He request blood cultures, for which I have ordered. Due to elevated CBG requiring treatment and multiple recheck, along with multiple medical pathology, critical care time was indicated.  12:45 PM [facility] Dr. Dan Maker has seen and evaluated pt. He agrees pt will likely benefiting from advance imaging such as MRI to evaluate her wrist pain.  Pt will need to be transfer to [facility] for this to happen.  Will consult Family Resident for admission.   2:16 PM Appreciate consultation from [facility] attending  Dr. Erin Hearing who agrees to admit pt to [facility] for further management of her sxs.  2:53 PM Pt notify to our nursing staff her desire to leave with her mom to go to [facility] to seek care.  She is aware that we have consulted with Family Medicine to have her transfer to [facility], as well as having MRI schedule for further evaluation.  I also offer any other assistance to help ease her transition.  Pt acknowledge the offer and request to leave AMA.  She understand the risk of leaving which includes delay in care, worsening of sxs and potential death.  She is able to make informed decision and show capacity to understand the situation.  AMA paperwork will be provided.  She understand to return if her condition worsen.    CRITICAL CARE Performed by: Domenic Moras Total critical care time: 30 minutes Critical care time was exclusive of separately billable procedures and treating other patients. Critical care was necessary to treat or prevent imminent or life-threatening deterioration. Critical care was time spent personally by me on the following activities: development of treatment plan with patient and/or surrogate as well as nursing, discussions with consultants, evaluation of patient's response to treatment, examination of patient, obtaining history from patient or surrogate, ordering and performing treatments and interventions, ordering and review of laboratory studies, ordering and review of radiographic studies, pulse oximetry and re-evaluation of patient's condition.    Domenic Moras, PA-C 06/22/19 1202    Domenic Moras, PA-C 06/22/19 1436  Domenic Moras, PA-C 06/22/19 1455

## 2019-06-22 NOTE — ED Notes (Signed)
Pt refusing monitoring while she waits for her mother to pick her up.

## 2019-06-22 NOTE — ED Notes (Signed)
This writer went to obtain CBG and found pt lying on edge of bed leaning on railing. This writer attempted to reposition pt into a more comfortable and safe position, pt refused. Pt also refused CBG and told this Probation officer she was going to [employer]. Pt states she is very unhappy that "nothing has been done," and will be "better off at [facility]". EDPA Mia is aware of pt's refusal/complaint. EDPA now at bedside with pt.

## 2019-06-22 NOTE — ED Notes (Signed)
Pt feels she is sitting in urine. Pure wick in place, dry pad and dry towel currently underneath pt. Pt assured area is dry.

## 2019-06-22 NOTE — Progress Notes (Signed)
Pt was given pain meds and attempted in MRI. Unable to scan pt due to body habitus. RN informed.

## 2019-06-23 DIAGNOSIS — M79641 Pain in right hand: Secondary | ICD-10-CM | POA: Insufficient documentation

## 2019-06-23 DIAGNOSIS — M25512 Pain in left shoulder: Secondary | ICD-10-CM

## 2019-06-23 DIAGNOSIS — S78119A Complete traumatic amputation at level between unspecified hip and knee, initial encounter: Secondary | ICD-10-CM | POA: Insufficient documentation

## 2019-06-23 HISTORY — DX: Pain in left shoulder: M25.512

## 2019-06-23 NOTE — Assessment & Plan Note (Signed)
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  Did not react with pain when placing the prosthesis back on leg.  Described as a burning but intermittently very sharp pain could perhaps be neuropathy secondary to amputation.  Will get x-ray of this area prior to prescribing any treatment.

## 2019-06-23 NOTE — Assessment & Plan Note (Addendum)
Unclear etiology given history and exam.  Patient reports to have exquisite tenderness palpation over PIP joint, but while distracted was able to fully grip a water bottle.  Will get right hand x-ray to better evaluate this area.  Given that patient appears to be in touch acute distress we will give her 1 dose of 2 mg IM morphine.  Patient to increase her Norco from every 8 to every 6hours.    Of note patient has a concerning history of asking for Dilaudid by name, and has been seen by several providers for this pain without pursuing obvious work-up recommended to her recently.  Given that this pain appears to disappear while distracted on other areas the patient is displaying hallmarks of possible drug-seeking behavior.  Would recommend no further narcotics until patient gets the recommended work-up performed for her pain.

## 2019-06-23 NOTE — Assessment & Plan Note (Signed)
Left shoulder pain which is been present for months according to patient.  No significant limitation in strength or range of motion.  Some limitation on ability to form liftoff test.  Potentially could be shoulder arthritis or perhaps impingement.  Will get left shoulder x-ray to better evaluate.

## 2019-06-23 NOTE — Progress Notes (Signed)
[name] who presents for "pain all over".  Patient reports most severe area is her right hand.  Patient also notes that her left shoulder and left lower extremity amputation site are also painful.  Patient states that pain started in her right hand is a numbness.  Eventually localized to her index finger, at her PIP joint.  Patient states that she can no longer bend her finger, that she has trouble gripping objects.  She is apparently had no imaging of this area done recently.  Patient has had no trauma to the area that she knows about, and no etiology of her pain aside from it being sudden onset after the numbness in her hand.  Patient has trouble describing the pain except for saying that it feels like "razor blades".  Patient describes the left shoulder pain as a numbness, which is causing her significant range of motion limitation.  States that this pain is much less severe than the hand pain.  States this is been going on for "several months now".  Upon review of her most recent hospitalization there was no mention of his left shoulder pain.  Patient proclaims pain at the stump site of her left AKA.  States it is more of a burning sensation, but is "sometimes very sharp".  This is also been present for several months.  She has tried nothing to relieve her pain.  She was seen in the ED on 06/20/2019.  She was given a dose of Dilaudid which she states "really helped her pain".  No imaging was obtained of any of these areas during that visit  Of note patient was noted to be causing tremendous distraction for other patients in the waiting room.  She noted to be screaming and crying for several minutes while in the waiting room for which she stated was due to her pain.  Multiple episodes of screaming and crying while in the exam room.  Additional history On completion of this note this provider notices the patient has had multiple presentations to emergency rooms.  1 to [facility]  long emergency department and 1 to [facility] emergency department.  At these visits patient was noted to be asking for Dilaudid by name.  She was going to be admitted to the family medicine teaching service but opted to leave AMA prior to admission.  CC: "Pain all over"  ROS:   Review of Systems See HPI for ROS.   CC, SH/smoking status, and VS noted  Objective: BP (!) 146/92   Pulse (!) 109   SpO2 48%  Gen: 43 year old Caucasian female, morbidly obese, in distress secondary to pain CV: RRR, no murmur Resp: CTAB, no wheezes, non-labored Abd: SNTND, BS present, no guarding or organomegaly Neuro: Alert and oriented, Speech clear, No gross deficits  Right hand No focal abnormality appreciated on exam.  No streaking.  Exquisite tenderness palpation PIP joint of right index finger.  Warm and dry.  Range of motion, strength intact.  Able to grip water bottle near end of exam while examining other area.  Left shoulder No focal abnormality appreciated by visual inspection.  Minimal tenderness palpation.  Range of motion intact, very mildly decreased ability to do liftoff test.  Left AKA stump Not evaluated by visual inspection.  Some tenderness to palpation.  Strength intact.  Range of motion intact.  When placing prosthesis back in place there was no painful reaction with his prosthesis was forcibly placed back in its proper nesting.  Assessment and plan:  Right hand pain Unclear etiology given history and exam.  Patient reports to have exquisite tenderness palpation over PIP joint, but while distracted was able to fully grip a water bottle.  Will get right hand x-ray to better evaluate this area.  Given that patient appears to be in touch acute distress we will give her 1 dose of 2 mg IM morphine.  Patient to increase her Norco from every 8 to every 6hours.    Of note patient has a concerning history of asking for Dilaudid by name, and has been seen by several providers for this  pain without pursuing obvious work-up recommended to her recently.  Given that this pain appears to disappear while distracted on other areas the patient is displaying hallmarks of possible drug-seeking behavior.  Would recommend no further narcotics until patient gets the recommended work-up performed for her pain.  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  Did not react with pain when placing the prosthesis back on leg.  Described as a burning but intermittently very sharp pain could perhaps be neuropathy secondary to amputation.  Will get x-ray of this area prior to prescribing any treatment.  Left shoulder pain Left shoulder pain which is been present for months according to patient.  No significant limitation in strength or range of motion.  Some limitation on ability to form liftoff test.  Potentially could be shoulder arthritis or perhaps impingement.  Will get left shoulder x-ray to better evaluate.   Orders Placed This Encounter  Procedures  . DG Hand Complete Right    Standing Status:   Future    Standing Expiration Date:   08/21/2020    Order Specific Question:   Reason for Exam (SYMPTOM  OR DIAGNOSIS REQUIRED)    Answer:   right hand pain    Order Specific Question:   Is patient pregnant?    Answer:   No    Order Specific Question:   Preferred imaging location?    Answer:   [facility]    Order Specific Question:   Radiology Contrast Protocol - do NOT remove file path    Answer:   \\[email or web address]\Radiant\DXFluoroContrastProtocols.pdf  . DG Knee Complete 4 Views Left    Standing Status:   Future    Standing Expiration Date:   08/21/2020    Order Specific Question:   Reason for Exam (SYMPTOM  OR DIAGNOSIS REQUIRED)    Answer:   left knee pain    Order Specific Question:   Is patient pregnant?    Answer:   No    Order Specific Question:   Preferred imaging location?    Answer:   [facility]    Order Specific Question:   Radiology  Contrast Protocol - do NOT remove file path    Answer:   \\[email or web address]\Radiant\DXFluoroContrastProtocols.pdf  . DG Shoulder Left    Standing Status:   Future    Standing Expiration Date:   08/21/2020    Order Specific Question:   Reason for Exam (SYMPTOM  OR DIAGNOSIS REQUIRED)    Answer:   left shoulder pain    Order Specific Question:   Is patient pregnant?    Answer:   No    Order Specific Question:   Preferred imaging location?    Answer:   [facility]    Order Specific Question:   Radiology Contrast Protocol - do NOT remove file path    Answer:   \\[email or web address]\Radiant\DXFluoroContrastProtocols.pdf  .  Glucose (CBG)    Meds ordered this encounter  Medications  . morphine 10 MG/ML injection     Guadalupe Dawn MD PGY-3 Family Medicine Resident  06/23/2019 11:14 PM

## 2019-06-25 DIAGNOSIS — E785 Hyperlipidemia, unspecified: Secondary | ICD-10-CM | POA: Diagnosis not present

## 2019-06-25 DIAGNOSIS — E43 Unspecified severe protein-calorie malnutrition: Secondary | ICD-10-CM | POA: Diagnosis not present

## 2019-06-25 DIAGNOSIS — T8789 Other complications of amputation stump: Secondary | ICD-10-CM | POA: Diagnosis not present

## 2019-06-25 DIAGNOSIS — G2581 Restless legs syndrome: Secondary | ICD-10-CM | POA: Diagnosis not present

## 2019-06-25 DIAGNOSIS — L89326 Pressure-induced deep tissue damage of left buttock: Secondary | ICD-10-CM | POA: Diagnosis not present

## 2019-06-25 DIAGNOSIS — E1142 Type 2 diabetes mellitus with diabetic polyneuropathy: Secondary | ICD-10-CM | POA: Diagnosis not present

## 2019-06-25 DIAGNOSIS — M1711 Unilateral primary osteoarthritis, right knee: Secondary | ICD-10-CM | POA: Diagnosis not present

## 2019-06-25 DIAGNOSIS — E1151 Type 2 diabetes mellitus with diabetic peripheral angiopathy without gangrene: Secondary | ICD-10-CM | POA: Diagnosis not present

## 2019-06-25 DIAGNOSIS — Z44112 Encounter for fitting and adjustment of complete left artificial leg: Secondary | ICD-10-CM | POA: Diagnosis not present

## 2019-06-25 DIAGNOSIS — L02211 Cutaneous abscess of abdominal wall: Secondary | ICD-10-CM | POA: Diagnosis not present

## 2019-06-25 DIAGNOSIS — G4733 Obstructive sleep apnea (adult) (pediatric): Secondary | ICD-10-CM | POA: Diagnosis not present

## 2019-06-25 DIAGNOSIS — I83893 Varicose veins of bilateral lower extremities with other complications: Secondary | ICD-10-CM | POA: Diagnosis not present

## 2019-06-25 DIAGNOSIS — D649 Anemia, unspecified: Secondary | ICD-10-CM | POA: Diagnosis not present

## 2019-06-25 DIAGNOSIS — J449 Chronic obstructive pulmonary disease, unspecified: Secondary | ICD-10-CM | POA: Diagnosis not present

## 2019-06-25 DIAGNOSIS — M1812 Unilateral primary osteoarthritis of first carpometacarpal joint, left hand: Secondary | ICD-10-CM | POA: Diagnosis not present

## 2019-06-25 DIAGNOSIS — F3181 Bipolar II disorder: Secondary | ICD-10-CM | POA: Diagnosis not present

## 2019-06-25 DIAGNOSIS — K219 Gastro-esophageal reflux disease without esophagitis: Secondary | ICD-10-CM | POA: Diagnosis not present

## 2019-06-25 DIAGNOSIS — J329 Chronic sinusitis, unspecified: Secondary | ICD-10-CM | POA: Diagnosis not present

## 2019-06-25 DIAGNOSIS — I10 Essential (primary) hypertension: Secondary | ICD-10-CM | POA: Diagnosis not present

## 2019-06-25 DIAGNOSIS — G8929 Other chronic pain: Secondary | ICD-10-CM | POA: Diagnosis not present

## 2019-06-25 DIAGNOSIS — E1140 Type 2 diabetes mellitus with diabetic neuropathy, unspecified: Secondary | ICD-10-CM | POA: Diagnosis not present

## 2019-06-25 DIAGNOSIS — E559 Vitamin D deficiency, unspecified: Secondary | ICD-10-CM | POA: Diagnosis not present

## 2019-06-25 DIAGNOSIS — Z44111 Encounter for fitting and adjustment of complete right artificial leg: Secondary | ICD-10-CM | POA: Diagnosis not present

## 2019-06-25 DIAGNOSIS — B372 Candidiasis of skin and nail: Secondary | ICD-10-CM | POA: Diagnosis not present

## 2019-06-25 DIAGNOSIS — M654 Radial styloid tenosynovitis [de Quervain]: Secondary | ICD-10-CM | POA: Diagnosis not present

## 2019-06-26 DIAGNOSIS — E1140 Type 2 diabetes mellitus with diabetic neuropathy, unspecified: Secondary | ICD-10-CM | POA: Diagnosis not present

## 2019-06-27 LAB — CULTURE, BLOOD (ROUTINE X 2)
Culture: NO GROWTH
Culture: NO GROWTH
Special Requests: ADEQUATE

## 2019-06-28 ENCOUNTER — Other Ambulatory Visit: Payer: Self-pay

## 2019-06-28 ENCOUNTER — Telehealth: Payer: Self-pay | Admitting: Family Medicine

## 2019-06-28 ENCOUNTER — Ambulatory Visit (INDEPENDENT_AMBULATORY_CARE_PROVIDER_SITE_OTHER): Payer: Medicaid Other | Admitting: Family Medicine

## 2019-06-28 VITALS — BP 120/75 | HR 91

## 2019-06-28 DIAGNOSIS — N390 Urinary tract infection, site not specified: Secondary | ICD-10-CM | POA: Diagnosis not present

## 2019-06-28 DIAGNOSIS — R319 Hematuria, unspecified: Secondary | ICD-10-CM | POA: Diagnosis not present

## 2019-06-28 DIAGNOSIS — M79641 Pain in right hand: Secondary | ICD-10-CM | POA: Diagnosis not present

## 2019-06-28 DIAGNOSIS — R300 Dysuria: Secondary | ICD-10-CM | POA: Diagnosis not present

## 2019-06-28 DIAGNOSIS — E1140 Type 2 diabetes mellitus with diabetic neuropathy, unspecified: Secondary | ICD-10-CM | POA: Diagnosis not present

## 2019-06-28 LAB — POCT UA - MICROSCOPIC ONLY

## 2019-06-28 LAB — POCT URINALYSIS DIP (MANUAL ENTRY)
Bilirubin, UA: NEGATIVE
Glucose, UA: >=1000 mg/dL — AB
Ketones, POC UA: NEGATIVE mg/dL
Nitrite, UA: NEGATIVE
Protein Ur, POC: >=300 mg/dL — AB
Spec Grav, UA: 1.02 (ref 1.010–1.025)
Urobilinogen, UA: 0.2 E.U./dL
pH, UA: 5.5 (ref 5.0–8.0)

## 2019-06-28 MED ORDER — NITROFURANTOIN MONOHYD MACRO 100 MG PO CAPS: 100.0000 mg | ORAL_CAPSULE | Freq: Two times a day (BID) | ORAL | 0 refills | Status: AC

## 2019-06-28 MED ORDER — NITROFURANTOIN MONOHYD MACRO 100 MG PO CAPS: 100.0000 mg | ORAL_CAPSULE | Freq: Two times a day (BID) | ORAL | 0 refills | Status: DC

## 2019-06-28 MED ORDER — DICLOFENAC SODIUM 1 % TD GEL: 2.0000 g | Freq: Four times a day (QID) | TRANSDERMAL | 1 refills | Status: DC

## 2019-06-28 NOTE — Telephone Encounter (Signed)
Pt had an apt today in office and wanted to get her medication HYDROcodone 10-325. Please give pt a call back.

## 2019-06-28 NOTE — Telephone Encounter (Signed)
Patient did not discuss this during visit. Will forward to PCP  [name], DO, PGY-3 [facility] 06/28/2019 5:39 PM

## 2019-06-28 NOTE — Assessment & Plan Note (Signed)
Patient with symptoms consistent of urinary tract infection.  Is chronically on Bactrim so it is likely that bacteria is resistant to this.  Urine was very cloudy, almost purulent looking.  Was also erythematous as if there was blood. UA showing significant glucose, large blood, large leukocytes.  Microscopy showing too many to count white blood cells.  Given that patient has failed Bactrim we will plan to give Macrobid.  Patient has a allergy to cephalosporins.  We will plan to also obtain culture so that we may obtain sensitivities.  Strict return precautions given.  Follow-up if no improvement.

## 2019-06-28 NOTE — Assessment & Plan Note (Signed)
Unclear etiology at this time.  Patient with multiple joints that are swollen both PIP and DIP joints.  No outside exposure or exposure to plants.  Small area that does appear to look somewhat like a bug bite however other areas of joints do not have similar rash.  Can consider rheumatological condition.  Will start work-up by obtaining hand x-ray, previously ordered by Dr. Kris Mouton so will not need to order new imaging as it is already in the system.  Patient was unaware that [facility] only obtained a wrist x-ray so she did not know she was supposed to go obtain the x-rays previously ordered by Dr. Kris Mouton.  Advised patient to follow-up with PCP in 1 to 2 weeks.  We will plan to give trial of diclofenac gel to see if this helps improve joint pain.  Appears to chronically be on opioids so no further medications will need to be prescribed.  Strict return precautions given.  Follow-up in 1 to 2 weeks with PCP.

## 2019-06-28 NOTE — Patient Instructions (Signed)
Please take macrobid twice a day for 10 days. If back pain, fever, or worsening pain please follow up or go to the ED  For your finger pain please get your hand xray. I have prescribed diclofenac gel to use.   Follow up with Dr. [name] in 1-2 weeks.   Dr. [name]

## 2019-06-28 NOTE — Progress Notes (Signed)
Subjective:    Patient ID: [name], female    DOB: 04/17/1976, 43 y.o.   MRN: [id number]   CC: pain with urination and finger pain  HPI: Pain with urination  Patient presenting today with dysuria x2 to 3 weeks.  States that she was previously tested but urine culture did not grow any bacteria.  States that she was given a "antibiotic" but is unsure what the name of it was but it did not seem to help.  Per chart review I am unable to find other antibiotics then Bactrim which she is on chronically for wound.  Does report urinary frequency.  Does report burning sensation when she urinates.  Denies any urgency.  Does report purulent urine.  Denies any fevers.  Recently had blood work and states her kidney was within normal limits.  Denies any back pain.  Pain with fingers Patient following up today as she states that her fingers continue to hurt.  States that she has been seen both in office in emergency department and no one seems to be able to find the source.  Denies going outside.  Denies any exposure to roses.  States area seems swollen.  Recently seen at [facility] by Dr. Kris Hall on 06/21/2019.  Seen again in emergency department on 06/22/2019.  Imaging at that time included wrist x-ray.  Was recommended to be admitted to [facility] so that she could obtain an MRI but refuses admission.  Did go to [facility] for further work-up but left due to long wait in emergency department.  Objective:  BP 120/75   Pulse 91   LMP  (LMP Unknown)   SpO2 99%  Vitals and nursing note reviewed  General: well nourished, in no acute distress HEENT: normocephalic, no scleral icterus or conjunctival pallor, no nasal discharge, moist mucous membranes Cardiac: RRR, clear S1 and S2, no murmurs, rubs, or gallops Respiratory: clear to auscultation bilaterally, no increased work of breathing Abdomen: soft, nontender, nondistended, no masses or organomegaly. Bowel sounds present Extremities: no edema or cyanosis.   Joint swelling noticed in multiple digits.  Tenderness with grip.  Unable to fully flex or extend right second and fourth digit.  Some bruising noted on second digit. Skin: warm and dry,  Neuro: alert and oriented, no focal deficits   Assessment & Plan:    UTI (urinary tract infection) Patient with symptoms consistent of urinary tract infection.  Is chronically on Bactrim so it is likely that bacteria is resistant to this.  Urine was very cloudy, almost purulent looking.  Was also erythematous as if there was blood. UA showing significant glucose, large blood, large leukocytes.  Microscopy showing too many to count white blood cells.  Given that patient has failed Bactrim we will plan to give Macrobid.  Patient has a allergy to cephalosporins.  We will plan to also obtain culture so that we may obtain sensitivities.  Strict return precautions given.  Follow-up if no improvement.  Right hand pain Unclear etiology at this time.  Patient with multiple joints that are swollen both PIP and DIP joints.  No outside exposure or exposure to plants.  Small area that does appear to look somewhat like a bug bite however other areas of joints do not have similar rash.  Can consider rheumatological condition.  Will start work-up by obtaining hand x-ray, previously ordered by Dr. Kris Hall so will not need to order new imaging as it is already in the system.  Patient was unaware that [name] only obtained a wrist x-ray so she did not know she was supposed to go obtain the x-rays previously ordered by Dr. Kris Hall.  Advised patient to follow-up with PCP in 1 to 2 weeks.  We will plan to give trial of diclofenac gel to see if this helps improve joint pain.  Appears to chronically be on opioids so no further medications will need to be prescribed.  Strict return precautions given.  Follow-up in 1 to 2 weeks with PCP.  Discussed patient with Dr. Erin Hearing who also came and examined patient.   Return in about 2  weeks (around 07/12/2019).   [name], DO, PGY-3

## 2019-06-29 ENCOUNTER — Telehealth: Payer: Self-pay | Admitting: Family Medicine

## 2019-06-29 DIAGNOSIS — E1142 Type 2 diabetes mellitus with diabetic polyneuropathy: Secondary | ICD-10-CM | POA: Diagnosis not present

## 2019-06-29 DIAGNOSIS — E559 Vitamin D deficiency, unspecified: Secondary | ICD-10-CM | POA: Diagnosis not present

## 2019-06-29 DIAGNOSIS — J449 Chronic obstructive pulmonary disease, unspecified: Secondary | ICD-10-CM | POA: Diagnosis not present

## 2019-06-29 DIAGNOSIS — K219 Gastro-esophageal reflux disease without esophagitis: Secondary | ICD-10-CM | POA: Diagnosis not present

## 2019-06-29 DIAGNOSIS — M654 Radial styloid tenosynovitis [de Quervain]: Secondary | ICD-10-CM | POA: Diagnosis not present

## 2019-06-29 DIAGNOSIS — T8789 Other complications of amputation stump: Secondary | ICD-10-CM | POA: Diagnosis not present

## 2019-06-29 DIAGNOSIS — J329 Chronic sinusitis, unspecified: Secondary | ICD-10-CM | POA: Diagnosis not present

## 2019-06-29 DIAGNOSIS — Z44112 Encounter for fitting and adjustment of complete left artificial leg: Secondary | ICD-10-CM | POA: Diagnosis not present

## 2019-06-29 DIAGNOSIS — E785 Hyperlipidemia, unspecified: Secondary | ICD-10-CM | POA: Diagnosis not present

## 2019-06-29 DIAGNOSIS — G8929 Other chronic pain: Secondary | ICD-10-CM | POA: Diagnosis not present

## 2019-06-29 DIAGNOSIS — L89326 Pressure-induced deep tissue damage of left buttock: Secondary | ICD-10-CM | POA: Diagnosis not present

## 2019-06-29 DIAGNOSIS — B372 Candidiasis of skin and nail: Secondary | ICD-10-CM | POA: Diagnosis not present

## 2019-06-29 DIAGNOSIS — M1711 Unilateral primary osteoarthritis, right knee: Secondary | ICD-10-CM | POA: Diagnosis not present

## 2019-06-29 DIAGNOSIS — F3181 Bipolar II disorder: Secondary | ICD-10-CM | POA: Diagnosis not present

## 2019-06-29 DIAGNOSIS — E1140 Type 2 diabetes mellitus with diabetic neuropathy, unspecified: Secondary | ICD-10-CM | POA: Diagnosis not present

## 2019-06-29 DIAGNOSIS — M1812 Unilateral primary osteoarthritis of first carpometacarpal joint, left hand: Secondary | ICD-10-CM | POA: Diagnosis not present

## 2019-06-29 DIAGNOSIS — I10 Essential (primary) hypertension: Secondary | ICD-10-CM | POA: Diagnosis not present

## 2019-06-29 DIAGNOSIS — G2581 Restless legs syndrome: Secondary | ICD-10-CM | POA: Diagnosis not present

## 2019-06-29 DIAGNOSIS — L02211 Cutaneous abscess of abdominal wall: Secondary | ICD-10-CM | POA: Diagnosis not present

## 2019-06-29 DIAGNOSIS — Z44111 Encounter for fitting and adjustment of complete right artificial leg: Secondary | ICD-10-CM | POA: Diagnosis not present

## 2019-06-29 DIAGNOSIS — D649 Anemia, unspecified: Secondary | ICD-10-CM | POA: Diagnosis not present

## 2019-06-29 DIAGNOSIS — I83893 Varicose veins of bilateral lower extremities with other complications: Secondary | ICD-10-CM | POA: Diagnosis not present

## 2019-06-29 DIAGNOSIS — G4733 Obstructive sleep apnea (adult) (pediatric): Secondary | ICD-10-CM | POA: Diagnosis not present

## 2019-06-29 DIAGNOSIS — E1151 Type 2 diabetes mellitus with diabetic peripheral angiopathy without gangrene: Secondary | ICD-10-CM | POA: Diagnosis not present

## 2019-06-29 DIAGNOSIS — E43 Unspecified severe protein-calorie malnutrition: Secondary | ICD-10-CM | POA: Diagnosis not present

## 2019-06-29 NOTE — Telephone Encounter (Signed)
Informed patient of RX.  .Michelle R Simpson, CMA  

## 2019-06-29 NOTE — Telephone Encounter (Signed)
Called patient's pharmacy about refill status.  The patient has 3 prescriptions of Norco 10-325  written by Dr. [name] in May.  These have been appropriately dispensed 30 days apart.  I called her pharmacy.  She has a refill on file that is available on Sunday.  This is 30 days from her last fill.  The patient will be out of pills on Sunday and would like to have pain medication available.  The pharmacy is closed on Sunday.  I approved a 1 day early refill so that she can have her pills available on Sunday when she runs out.  Left voicemail for patient to call back regarding above discussion with the pharmacy.  If patient calls back, let her know her refill is available at pharmacy.  Dorris Singh, MD  Family Medicine Teaching Service

## 2019-06-29 NOTE — Telephone Encounter (Signed)
Patient received a phone call but a voicemail was not left, she is not sure who called.  Patient says she had a urine test and maybe it was her results (?)  Please call patient to let her know her results.   [phone].

## 2019-06-30 DIAGNOSIS — E1140 Type 2 diabetes mellitus with diabetic neuropathy, unspecified: Secondary | ICD-10-CM | POA: Diagnosis not present

## 2019-06-30 LAB — URINE CULTURE

## 2019-07-01 ENCOUNTER — Telehealth: Payer: Self-pay | Admitting: Family Medicine

## 2019-07-01 ENCOUNTER — Other Ambulatory Visit: Payer: Self-pay | Admitting: Family Medicine

## 2019-07-01 DIAGNOSIS — E1140 Type 2 diabetes mellitus with diabetic neuropathy, unspecified: Secondary | ICD-10-CM | POA: Diagnosis not present

## 2019-07-01 NOTE — Telephone Encounter (Signed)
**  After-hours emergency call**   Patient calling after-hours line due to noticing bright blood in her urine for the first time tonight.  However, interestingly note an office visit from 7/24 that her urine was noted to be erythematous.  Seen on 7/24 for UTI symptoms, started on Macrobid course at that time to continue for 10 days.  She continues to have some dysuria, urinary frequency, and urgency -slightly better since Friday but not resolved. She is not on her menstrual cycle, does not have any blood within her underwear in between urinating.  Otherwise feels at her normal state of health, denies any associated fever, chills, flank pain, abdominal pain, recent trauma or exercise.  Urine culture from specimen obtained on 7/24 unremarkable, however is on chronic antibiotic therapy. Symptoms and U/a clinically consistent with UTI.  Discussed with patient that hematuria is likely related to UTI and to follow-up in the next few days if hematuria and/or symptoms are not improving/resolving while on Macrobid.  Additionally discussed that even if symptoms resolve that she should follow-up in approximately 2 weeks as already directed in last office visit for a repeat U/a to evaluate for possible continued microscopic hematuria.  Recent renal function WNL at 0.97 on 7/17.  Considered nephrolithiasis, however without any associated flank pain do not feel she needs to present to the ED for imaging.   Will route to PCP.   Patriciaann Clan, DO

## 2019-07-02 ENCOUNTER — Telehealth (INDEPENDENT_AMBULATORY_CARE_PROVIDER_SITE_OTHER): Payer: Medicaid Other | Admitting: Family Medicine

## 2019-07-02 ENCOUNTER — Other Ambulatory Visit: Payer: Self-pay

## 2019-07-02 DIAGNOSIS — R319 Hematuria, unspecified: Secondary | ICD-10-CM

## 2019-07-02 DIAGNOSIS — E1140 Type 2 diabetes mellitus with diabetic neuropathy, unspecified: Secondary | ICD-10-CM | POA: Diagnosis not present

## 2019-07-02 DIAGNOSIS — N390 Urinary tract infection, site not specified: Secondary | ICD-10-CM | POA: Diagnosis not present

## 2019-07-02 MED ORDER — FOSFOMYCIN TROMETHAMINE 3 G PO PACK: 3.0000 g | PACK | Freq: Once | ORAL | 0 refills | Status: AC

## 2019-07-02 MED ORDER — DICLOFENAC SODIUM 1 % TD GEL: 2.0000 g | Freq: Four times a day (QID) | TRANSDERMAL | 1 refills | Status: DC

## 2019-07-02 NOTE — Telephone Encounter (Signed)
Called patient to inform her of negative urine culture and there was no answer.  LVM message asking patient to call office for results.  Generic voice mail.  [name], [place]

## 2019-07-02 NOTE — Assessment & Plan Note (Signed)
UA shows blood and leukocytes and her symptoms are consistent with urinary tract infection.  However, she may be experiencing a noninfectious cystitis or nephritis.  She has been on Bactrim for over 1 month to may be experiencing kidney injury or crystal urea as a complication.  If this is solely the fault of the infection is certainly resistant to Bactrim may be getting resistance.  She is currently taking Bactrim and nitrofurantoin.  We will plan to treat this as a UTI while referring to urology for further possible work-up of her hematuria. -Fosfomycin 3 g once -Referral to urology

## 2019-07-02 NOTE — Progress Notes (Signed)
[place] Telemedicine Visit  Patient consented to have virtual visit. Method of visit: Telephone  Encounter participants: Patient: [name] - located at Home Provider: [name] - located at [facility] Others (if applicable): none  Chief Complaint: red urine  HPI:  UTI [name] reports that she continues to have red urine in addition to mild burning at the end of her urination and suprapubic pain.  She denies fevers, new low back pain, nausea, vomiting.  She has been seen multiple times in the clinic in the ED.  Her most recent UA and urine culture from 7/23 show large blood and leukocytes, no nitrites, no growth with urine culture.  She remains on Bactrim prophylaxis due to her complex wound care and is in the middle of a 10-day course of Macrobid.  Finger pain She continues to report that she has significant pain on her right index finger.  This is been ongoing and she reports that her current pain regimen is not helpful she is taking Norco 10 mg every 6 hours.  She reports that she is currently having this worked up and has been having x-ray done later in the week.   ROS: per HPI  Pertinent PMHx: Poorly controlled diabetes, recent hospitalization for pannus and gluteal abscesses requiring incision, drainage and debridement in the OR.  Exam:  Respiratory: Breathing comfortably on room air, able to complete long sentences without issue.  Assessment/Plan:  UTI (urinary tract infection) UA shows blood and leukocytes and her symptoms are consistent with urinary tract infection.  However, she may be experiencing a noninfectious cystitis or nephritis.  She has been on Bactrim for over 1 month to may be experiencing kidney injury or crystal urea as a complication.  If this is solely the fault of the infection is certainly resistant to Bactrim may be getting resistance.  She is currently taking Bactrim and nitrofurantoin.  We will plan to treat this as a UTI while  referring to urology for further possible work-up of her hematuria. -Fosfomycin 3 g once -Referral to urology   Finger pain No plan to increase opioids at this time.  Voltaren gel prescribed at last visit that she is not able to pick up for insurance reasons.  Dr. Owens Shark or Dr. [name] must prescribe pain related medications. -Dr. [name] to prescribe Voltaren gel   Time spent during visit with patient: 20 minutes

## 2019-07-03 DIAGNOSIS — Z44111 Encounter for fitting and adjustment of complete right artificial leg: Secondary | ICD-10-CM | POA: Diagnosis not present

## 2019-07-03 DIAGNOSIS — M654 Radial styloid tenosynovitis [de Quervain]: Secondary | ICD-10-CM | POA: Diagnosis not present

## 2019-07-03 DIAGNOSIS — E1151 Type 2 diabetes mellitus with diabetic peripheral angiopathy without gangrene: Secondary | ICD-10-CM | POA: Diagnosis not present

## 2019-07-03 DIAGNOSIS — E559 Vitamin D deficiency, unspecified: Secondary | ICD-10-CM | POA: Diagnosis not present

## 2019-07-03 DIAGNOSIS — E1140 Type 2 diabetes mellitus with diabetic neuropathy, unspecified: Secondary | ICD-10-CM | POA: Diagnosis not present

## 2019-07-03 DIAGNOSIS — E785 Hyperlipidemia, unspecified: Secondary | ICD-10-CM | POA: Diagnosis not present

## 2019-07-03 DIAGNOSIS — M1711 Unilateral primary osteoarthritis, right knee: Secondary | ICD-10-CM | POA: Diagnosis not present

## 2019-07-03 DIAGNOSIS — G2581 Restless legs syndrome: Secondary | ICD-10-CM | POA: Diagnosis not present

## 2019-07-03 DIAGNOSIS — I10 Essential (primary) hypertension: Secondary | ICD-10-CM | POA: Diagnosis not present

## 2019-07-03 DIAGNOSIS — D649 Anemia, unspecified: Secondary | ICD-10-CM | POA: Diagnosis not present

## 2019-07-03 DIAGNOSIS — F3181 Bipolar II disorder: Secondary | ICD-10-CM | POA: Diagnosis not present

## 2019-07-03 DIAGNOSIS — G4733 Obstructive sleep apnea (adult) (pediatric): Secondary | ICD-10-CM | POA: Diagnosis not present

## 2019-07-03 DIAGNOSIS — E1142 Type 2 diabetes mellitus with diabetic polyneuropathy: Secondary | ICD-10-CM | POA: Diagnosis not present

## 2019-07-03 DIAGNOSIS — G8929 Other chronic pain: Secondary | ICD-10-CM | POA: Diagnosis not present

## 2019-07-03 DIAGNOSIS — L02211 Cutaneous abscess of abdominal wall: Secondary | ICD-10-CM | POA: Diagnosis not present

## 2019-07-03 DIAGNOSIS — K219 Gastro-esophageal reflux disease without esophagitis: Secondary | ICD-10-CM | POA: Diagnosis not present

## 2019-07-03 DIAGNOSIS — Z44112 Encounter for fitting and adjustment of complete left artificial leg: Secondary | ICD-10-CM | POA: Diagnosis not present

## 2019-07-03 DIAGNOSIS — B372 Candidiasis of skin and nail: Secondary | ICD-10-CM | POA: Diagnosis not present

## 2019-07-03 DIAGNOSIS — J449 Chronic obstructive pulmonary disease, unspecified: Secondary | ICD-10-CM | POA: Diagnosis not present

## 2019-07-03 DIAGNOSIS — T8789 Other complications of amputation stump: Secondary | ICD-10-CM | POA: Diagnosis not present

## 2019-07-03 DIAGNOSIS — J329 Chronic sinusitis, unspecified: Secondary | ICD-10-CM | POA: Diagnosis not present

## 2019-07-03 DIAGNOSIS — E43 Unspecified severe protein-calorie malnutrition: Secondary | ICD-10-CM | POA: Diagnosis not present

## 2019-07-03 DIAGNOSIS — L89326 Pressure-induced deep tissue damage of left buttock: Secondary | ICD-10-CM | POA: Diagnosis not present

## 2019-07-03 DIAGNOSIS — I83893 Varicose veins of bilateral lower extremities with other complications: Secondary | ICD-10-CM | POA: Diagnosis not present

## 2019-07-03 DIAGNOSIS — M1812 Unilateral primary osteoarthritis of first carpometacarpal joint, left hand: Secondary | ICD-10-CM | POA: Diagnosis not present

## 2019-07-04 DIAGNOSIS — Z6841 Body Mass Index (BMI) 40.0 and over, adult: Secondary | ICD-10-CM | POA: Diagnosis not present

## 2019-07-04 DIAGNOSIS — J449 Chronic obstructive pulmonary disease, unspecified: Secondary | ICD-10-CM | POA: Diagnosis not present

## 2019-07-04 DIAGNOSIS — Z89421 Acquired absence of other right toe(s): Secondary | ICD-10-CM | POA: Diagnosis not present

## 2019-07-04 DIAGNOSIS — Z8631 Personal history of diabetic foot ulcer: Secondary | ICD-10-CM | POA: Diagnosis not present

## 2019-07-04 DIAGNOSIS — Z794 Long term (current) use of insulin: Secondary | ICD-10-CM | POA: Diagnosis not present

## 2019-07-04 DIAGNOSIS — E1140 Type 2 diabetes mellitus with diabetic neuropathy, unspecified: Secondary | ICD-10-CM | POA: Diagnosis not present

## 2019-07-04 DIAGNOSIS — L98495 Non-pressure chronic ulcer of skin of other sites with muscle involvement without evidence of necrosis: Secondary | ICD-10-CM | POA: Diagnosis not present

## 2019-07-04 DIAGNOSIS — L0231 Cutaneous abscess of buttock: Secondary | ICD-10-CM | POA: Diagnosis not present

## 2019-07-04 DIAGNOSIS — I10 Essential (primary) hypertension: Secondary | ICD-10-CM | POA: Diagnosis not present

## 2019-07-04 DIAGNOSIS — L97519 Non-pressure chronic ulcer of other part of right foot with unspecified severity: Secondary | ICD-10-CM | POA: Diagnosis not present

## 2019-07-04 DIAGNOSIS — Z87891 Personal history of nicotine dependence: Secondary | ICD-10-CM | POA: Diagnosis not present

## 2019-07-04 DIAGNOSIS — L98415 Non-pressure chronic ulcer of buttock with muscle involvement without evidence of necrosis: Secondary | ICD-10-CM | POA: Diagnosis not present

## 2019-07-05 DIAGNOSIS — I10 Essential (primary) hypertension: Secondary | ICD-10-CM | POA: Diagnosis not present

## 2019-07-05 DIAGNOSIS — M654 Radial styloid tenosynovitis [de Quervain]: Secondary | ICD-10-CM | POA: Diagnosis not present

## 2019-07-05 DIAGNOSIS — E1151 Type 2 diabetes mellitus with diabetic peripheral angiopathy without gangrene: Secondary | ICD-10-CM | POA: Diagnosis not present

## 2019-07-05 DIAGNOSIS — G4733 Obstructive sleep apnea (adult) (pediatric): Secondary | ICD-10-CM | POA: Diagnosis not present

## 2019-07-05 DIAGNOSIS — L89326 Pressure-induced deep tissue damage of left buttock: Secondary | ICD-10-CM | POA: Diagnosis not present

## 2019-07-05 DIAGNOSIS — E1142 Type 2 diabetes mellitus with diabetic polyneuropathy: Secondary | ICD-10-CM | POA: Diagnosis not present

## 2019-07-05 DIAGNOSIS — L02211 Cutaneous abscess of abdominal wall: Secondary | ICD-10-CM | POA: Diagnosis not present

## 2019-07-05 DIAGNOSIS — Z44111 Encounter for fitting and adjustment of complete right artificial leg: Secondary | ICD-10-CM | POA: Diagnosis not present

## 2019-07-05 DIAGNOSIS — E43 Unspecified severe protein-calorie malnutrition: Secondary | ICD-10-CM | POA: Diagnosis not present

## 2019-07-05 DIAGNOSIS — J449 Chronic obstructive pulmonary disease, unspecified: Secondary | ICD-10-CM | POA: Diagnosis not present

## 2019-07-05 DIAGNOSIS — E785 Hyperlipidemia, unspecified: Secondary | ICD-10-CM | POA: Diagnosis not present

## 2019-07-05 DIAGNOSIS — Z44112 Encounter for fitting and adjustment of complete left artificial leg: Secondary | ICD-10-CM | POA: Diagnosis not present

## 2019-07-05 DIAGNOSIS — F3181 Bipolar II disorder: Secondary | ICD-10-CM | POA: Diagnosis not present

## 2019-07-05 DIAGNOSIS — G2581 Restless legs syndrome: Secondary | ICD-10-CM | POA: Diagnosis not present

## 2019-07-05 DIAGNOSIS — G8929 Other chronic pain: Secondary | ICD-10-CM | POA: Diagnosis not present

## 2019-07-05 DIAGNOSIS — D649 Anemia, unspecified: Secondary | ICD-10-CM | POA: Diagnosis not present

## 2019-07-05 DIAGNOSIS — M1812 Unilateral primary osteoarthritis of first carpometacarpal joint, left hand: Secondary | ICD-10-CM | POA: Diagnosis not present

## 2019-07-05 DIAGNOSIS — B372 Candidiasis of skin and nail: Secondary | ICD-10-CM | POA: Diagnosis not present

## 2019-07-05 DIAGNOSIS — E559 Vitamin D deficiency, unspecified: Secondary | ICD-10-CM | POA: Diagnosis not present

## 2019-07-05 DIAGNOSIS — E1140 Type 2 diabetes mellitus with diabetic neuropathy, unspecified: Secondary | ICD-10-CM | POA: Diagnosis not present

## 2019-07-05 DIAGNOSIS — M1711 Unilateral primary osteoarthritis, right knee: Secondary | ICD-10-CM | POA: Diagnosis not present

## 2019-07-05 DIAGNOSIS — K219 Gastro-esophageal reflux disease without esophagitis: Secondary | ICD-10-CM | POA: Diagnosis not present

## 2019-07-05 DIAGNOSIS — I83893 Varicose veins of bilateral lower extremities with other complications: Secondary | ICD-10-CM | POA: Diagnosis not present

## 2019-07-05 DIAGNOSIS — T8789 Other complications of amputation stump: Secondary | ICD-10-CM | POA: Diagnosis not present

## 2019-07-05 DIAGNOSIS — J329 Chronic sinusitis, unspecified: Secondary | ICD-10-CM | POA: Diagnosis not present

## 2019-07-06 DIAGNOSIS — E1140 Type 2 diabetes mellitus with diabetic neuropathy, unspecified: Secondary | ICD-10-CM | POA: Diagnosis not present

## 2019-07-07 DIAGNOSIS — E1140 Type 2 diabetes mellitus with diabetic neuropathy, unspecified: Secondary | ICD-10-CM | POA: Diagnosis not present

## 2019-07-09 ENCOUNTER — Encounter: Payer: Self-pay | Admitting: Family Medicine

## 2019-07-09 DIAGNOSIS — G4733 Obstructive sleep apnea (adult) (pediatric): Secondary | ICD-10-CM | POA: Diagnosis not present

## 2019-07-09 DIAGNOSIS — T8789 Other complications of amputation stump: Secondary | ICD-10-CM | POA: Diagnosis not present

## 2019-07-09 DIAGNOSIS — E43 Unspecified severe protein-calorie malnutrition: Secondary | ICD-10-CM | POA: Diagnosis not present

## 2019-07-09 DIAGNOSIS — B372 Candidiasis of skin and nail: Secondary | ICD-10-CM | POA: Diagnosis not present

## 2019-07-09 DIAGNOSIS — J329 Chronic sinusitis, unspecified: Secondary | ICD-10-CM | POA: Diagnosis not present

## 2019-07-09 DIAGNOSIS — D649 Anemia, unspecified: Secondary | ICD-10-CM | POA: Diagnosis not present

## 2019-07-09 DIAGNOSIS — Z44112 Encounter for fitting and adjustment of complete left artificial leg: Secondary | ICD-10-CM | POA: Diagnosis not present

## 2019-07-09 DIAGNOSIS — G2581 Restless legs syndrome: Secondary | ICD-10-CM | POA: Diagnosis not present

## 2019-07-09 DIAGNOSIS — I83893 Varicose veins of bilateral lower extremities with other complications: Secondary | ICD-10-CM | POA: Diagnosis not present

## 2019-07-09 DIAGNOSIS — E1142 Type 2 diabetes mellitus with diabetic polyneuropathy: Secondary | ICD-10-CM | POA: Diagnosis not present

## 2019-07-09 DIAGNOSIS — L89326 Pressure-induced deep tissue damage of left buttock: Secondary | ICD-10-CM | POA: Diagnosis not present

## 2019-07-09 DIAGNOSIS — K219 Gastro-esophageal reflux disease without esophagitis: Secondary | ICD-10-CM | POA: Diagnosis not present

## 2019-07-09 DIAGNOSIS — I10 Essential (primary) hypertension: Secondary | ICD-10-CM | POA: Diagnosis not present

## 2019-07-09 DIAGNOSIS — M654 Radial styloid tenosynovitis [de Quervain]: Secondary | ICD-10-CM | POA: Diagnosis not present

## 2019-07-09 DIAGNOSIS — E1151 Type 2 diabetes mellitus with diabetic peripheral angiopathy without gangrene: Secondary | ICD-10-CM | POA: Diagnosis not present

## 2019-07-09 DIAGNOSIS — G8929 Other chronic pain: Secondary | ICD-10-CM | POA: Diagnosis not present

## 2019-07-09 DIAGNOSIS — E1140 Type 2 diabetes mellitus with diabetic neuropathy, unspecified: Secondary | ICD-10-CM | POA: Diagnosis not present

## 2019-07-09 DIAGNOSIS — F3181 Bipolar II disorder: Secondary | ICD-10-CM | POA: Diagnosis not present

## 2019-07-09 DIAGNOSIS — J449 Chronic obstructive pulmonary disease, unspecified: Secondary | ICD-10-CM | POA: Diagnosis not present

## 2019-07-09 DIAGNOSIS — Z44111 Encounter for fitting and adjustment of complete right artificial leg: Secondary | ICD-10-CM | POA: Diagnosis not present

## 2019-07-09 DIAGNOSIS — M1711 Unilateral primary osteoarthritis, right knee: Secondary | ICD-10-CM | POA: Diagnosis not present

## 2019-07-09 DIAGNOSIS — L02211 Cutaneous abscess of abdominal wall: Secondary | ICD-10-CM | POA: Diagnosis not present

## 2019-07-09 DIAGNOSIS — M1812 Unilateral primary osteoarthritis of first carpometacarpal joint, left hand: Secondary | ICD-10-CM | POA: Diagnosis not present

## 2019-07-09 DIAGNOSIS — E559 Vitamin D deficiency, unspecified: Secondary | ICD-10-CM | POA: Diagnosis not present

## 2019-07-09 DIAGNOSIS — E785 Hyperlipidemia, unspecified: Secondary | ICD-10-CM | POA: Diagnosis not present

## 2019-07-10 ENCOUNTER — Other Ambulatory Visit: Payer: Self-pay | Admitting: *Deleted

## 2019-07-10 DIAGNOSIS — E1140 Type 2 diabetes mellitus with diabetic neuropathy, unspecified: Secondary | ICD-10-CM | POA: Diagnosis not present

## 2019-07-11 DIAGNOSIS — E1140 Type 2 diabetes mellitus with diabetic neuropathy, unspecified: Secondary | ICD-10-CM | POA: Diagnosis not present

## 2019-07-11 MED ORDER — BUDESONIDE-FORMOTEROL FUMARATE 160-4.5 MCG/ACT IN AERO: INHALATION_SPRAY | RESPIRATORY_TRACT | 4 refills | Status: DC

## 2019-07-12 ENCOUNTER — Encounter: Payer: Self-pay | Admitting: Family Medicine

## 2019-07-12 ENCOUNTER — Other Ambulatory Visit: Payer: Self-pay

## 2019-07-12 ENCOUNTER — Ambulatory Visit (INDEPENDENT_AMBULATORY_CARE_PROVIDER_SITE_OTHER): Payer: Medicaid Other | Admitting: Family Medicine

## 2019-07-12 DIAGNOSIS — E1165 Type 2 diabetes mellitus with hyperglycemia: Secondary | ICD-10-CM | POA: Diagnosis not present

## 2019-07-12 DIAGNOSIS — G8929 Other chronic pain: Secondary | ICD-10-CM

## 2019-07-12 DIAGNOSIS — M79641 Pain in right hand: Secondary | ICD-10-CM | POA: Diagnosis not present

## 2019-07-12 DIAGNOSIS — M79645 Pain in left finger(s): Secondary | ICD-10-CM | POA: Diagnosis not present

## 2019-07-12 DIAGNOSIS — IMO0002 Reserved for concepts with insufficient information to code with codable children: Secondary | ICD-10-CM

## 2019-07-12 DIAGNOSIS — E1151 Type 2 diabetes mellitus with diabetic peripheral angiopathy without gangrene: Secondary | ICD-10-CM | POA: Diagnosis not present

## 2019-07-12 DIAGNOSIS — R300 Dysuria: Secondary | ICD-10-CM

## 2019-07-12 DIAGNOSIS — M79644 Pain in right finger(s): Secondary | ICD-10-CM

## 2019-07-12 DIAGNOSIS — E1140 Type 2 diabetes mellitus with diabetic neuropathy, unspecified: Secondary | ICD-10-CM | POA: Diagnosis not present

## 2019-07-12 LAB — POCT URINALYSIS DIP (MANUAL ENTRY)
Bilirubin, UA: NEGATIVE
Glucose, UA: >=1000 mg/dL — AB
Ketones, POC UA: NEGATIVE mg/dL
Nitrite, UA: NEGATIVE
Protein Ur, POC: 100 mg/dL — AB
Spec Grav, UA: 1.01 (ref 1.010–1.025)
Urobilinogen, UA: 0.2 E.U./dL
pH, UA: 5.5 (ref 5.0–8.0)

## 2019-07-12 NOTE — Patient Instructions (Signed)
Pain medication - will refill, I will speakw ith your pharmacy  Diabetes - call endocrinologist and make appointment  Urine - sending culture, referring to urology  Fingers - get xrays, will call medicaid transportation for you  Visit scheduled to follow up in 1 month Call with questions  Be well, Dr. [name]

## 2019-07-12 NOTE — Progress Notes (Signed)
Date of Visit: 07/12/2019   HPI:  [name] presents for follow up.  Has had an eventful few months since I last saw her in May.  Hospitalized for abscesses of buttock and abdomen, had I&D done. Going to wound care center and also getting home health. Wounds are healing gradually. She does not need me to look at them today.  Chronic pain - taking hydrocodone 10-325mg  three times daily. Early on when having increased pain in her hands she was advised by Dr. Kris Mouton to take her norco four times a day instead of three times daily, so she will run out early this month. Needs refills. Storing medication safely. No difficulty controlling how often she takes it. No adverse side effects. Pain medication helps her function better. Also taking flexeril about 1-2 times per week.  Hands - has new subacute pain in two of her fingers. Located in R index finger PIP and L middle finger DIP. Seen in ED and they attempted MRI but could not complete the study as her habitus would not fit inside MRI machine. Awaiting xrays but having lots of trouble getting transportation to [place] because needs an actual appointment for medicaid transportation, and they don't schedule appts for xrays. Pain in fingers is gradually improving but still significant.  Diabetes - taking humalin 70/30 70 units in AM and 35 units in PM. Past due for follow up with endocrinology, willing to schedule appointment.   Urine issues - having urinary frequency and dysuria. No fevers. Urine looks like milk with a red tint. Eating and drinking well. Previously treated with bactrim (long course due to wounds),  Macrobid, as well as fosfomycin all without improvement. Urine cultures have been negative. Pending urology referral.  ROS: See HPI.  [place]: history of diabetes, chronic pain, morbid obesity, hypertension, asthma, GERD, hyperlipidemia, diabetic neuropathy, prior amputations  PHYSICAL EXAM: BP 122/80   Pulse 93   Wt 297 lb 6.4 oz  (134.9 kg)   LMP  (LMP Unknown)   SpO2 99%   BMI 54.40 kg/m  Gen: no acute distress, pleasant, cooperative HEENT: normocephalic, atraumatic  Lungs: normal work of breathing  Neuro: alert, grossly nonfocal, speech normal Ext: R second digit PIP and L third digit DIP with mild swelling and bruising, limited ROM actively or passively, mildly tender to palpation   ASSESSMENT/PLAN:  DM (diabetes mellitus) type II uncontrolled, periph vascular disorder ([place]) Encouraged to schedule follow up with endocrinology  Encounter for chronic pain management Stable overall. [place] PMP reviewed with appropriate findings. Will refill x3 months, allowing early fill this first month due to being instructed to take pain medication more often several weeks back (confirmed in office visit notes).  Dysuria Urine does appear milky with red tint - very unusual appearing. Will resend for culture. Await urology appointment. No empiric antibiotics at this time until culture has resulted, as prior cx were negative and she has been treated with many antibiotics already, and these are continuous symptoms.   Right hand pain Continued pain in fingers though improving. Needs xrays but having barriers with transportation. I attempted to schedule an xray appointment for her but was told they do not schedule these. I will personally call the medicaid transportation office and advocate for her to get a ride to [place]. Will contact patient with transportation appointment.   FOLLOW UP: Follow up in 1 month for above issues  [name], [place]

## 2019-07-13 DIAGNOSIS — E1140 Type 2 diabetes mellitus with diabetic neuropathy, unspecified: Secondary | ICD-10-CM | POA: Diagnosis not present

## 2019-07-13 MED ORDER — HYDROCODONE-ACETAMINOPHEN 10-325 MG PO TABS: 1.0000 | ORAL_TABLET | Freq: Three times a day (TID) | ORAL | 0 refills | Status: DC | PRN

## 2019-07-14 DIAGNOSIS — E1140 Type 2 diabetes mellitus with diabetic neuropathy, unspecified: Secondary | ICD-10-CM | POA: Diagnosis not present

## 2019-07-14 LAB — URINE CULTURE

## 2019-07-15 DIAGNOSIS — E1140 Type 2 diabetes mellitus with diabetic neuropathy, unspecified: Secondary | ICD-10-CM | POA: Diagnosis not present

## 2019-07-16 ENCOUNTER — Telehealth: Payer: Self-pay | Admitting: Family Medicine

## 2019-07-16 DIAGNOSIS — E1140 Type 2 diabetes mellitus with diabetic neuropathy, unspecified: Secondary | ICD-10-CM | POA: Diagnosis not present

## 2019-07-16 NOTE — Telephone Encounter (Signed)
Pt called and would like to speak to her doctor about some issues jw

## 2019-07-17 ENCOUNTER — Encounter: Payer: Self-pay | Admitting: Family Medicine

## 2019-07-17 ENCOUNTER — Telehealth: Payer: Self-pay | Admitting: Family Medicine

## 2019-07-17 DIAGNOSIS — G2581 Restless legs syndrome: Secondary | ICD-10-CM | POA: Diagnosis not present

## 2019-07-17 DIAGNOSIS — M1711 Unilateral primary osteoarthritis, right knee: Secondary | ICD-10-CM | POA: Diagnosis not present

## 2019-07-17 DIAGNOSIS — D649 Anemia, unspecified: Secondary | ICD-10-CM | POA: Diagnosis not present

## 2019-07-17 DIAGNOSIS — I10 Essential (primary) hypertension: Secondary | ICD-10-CM | POA: Diagnosis not present

## 2019-07-17 DIAGNOSIS — G8929 Other chronic pain: Secondary | ICD-10-CM | POA: Diagnosis not present

## 2019-07-17 DIAGNOSIS — E43 Unspecified severe protein-calorie malnutrition: Secondary | ICD-10-CM | POA: Diagnosis not present

## 2019-07-17 DIAGNOSIS — K219 Gastro-esophageal reflux disease without esophagitis: Secondary | ICD-10-CM | POA: Diagnosis not present

## 2019-07-17 DIAGNOSIS — T8789 Other complications of amputation stump: Secondary | ICD-10-CM | POA: Diagnosis not present

## 2019-07-17 DIAGNOSIS — M654 Radial styloid tenosynovitis [de Quervain]: Secondary | ICD-10-CM | POA: Diagnosis not present

## 2019-07-17 DIAGNOSIS — B372 Candidiasis of skin and nail: Secondary | ICD-10-CM | POA: Diagnosis not present

## 2019-07-17 DIAGNOSIS — E559 Vitamin D deficiency, unspecified: Secondary | ICD-10-CM | POA: Diagnosis not present

## 2019-07-17 DIAGNOSIS — L02211 Cutaneous abscess of abdominal wall: Secondary | ICD-10-CM | POA: Diagnosis not present

## 2019-07-17 DIAGNOSIS — J329 Chronic sinusitis, unspecified: Secondary | ICD-10-CM | POA: Diagnosis not present

## 2019-07-17 DIAGNOSIS — L89326 Pressure-induced deep tissue damage of left buttock: Secondary | ICD-10-CM | POA: Diagnosis not present

## 2019-07-17 DIAGNOSIS — I83893 Varicose veins of bilateral lower extremities with other complications: Secondary | ICD-10-CM | POA: Diagnosis not present

## 2019-07-17 DIAGNOSIS — G4733 Obstructive sleep apnea (adult) (pediatric): Secondary | ICD-10-CM | POA: Diagnosis not present

## 2019-07-17 DIAGNOSIS — F3181 Bipolar II disorder: Secondary | ICD-10-CM | POA: Diagnosis not present

## 2019-07-17 DIAGNOSIS — E1140 Type 2 diabetes mellitus with diabetic neuropathy, unspecified: Secondary | ICD-10-CM | POA: Diagnosis not present

## 2019-07-17 DIAGNOSIS — Z44112 Encounter for fitting and adjustment of complete left artificial leg: Secondary | ICD-10-CM | POA: Diagnosis not present

## 2019-07-17 DIAGNOSIS — E1151 Type 2 diabetes mellitus with diabetic peripheral angiopathy without gangrene: Secondary | ICD-10-CM | POA: Diagnosis not present

## 2019-07-17 DIAGNOSIS — E1142 Type 2 diabetes mellitus with diabetic polyneuropathy: Secondary | ICD-10-CM | POA: Diagnosis not present

## 2019-07-17 DIAGNOSIS — E785 Hyperlipidemia, unspecified: Secondary | ICD-10-CM | POA: Diagnosis not present

## 2019-07-17 DIAGNOSIS — M1812 Unilateral primary osteoarthritis of first carpometacarpal joint, left hand: Secondary | ICD-10-CM | POA: Diagnosis not present

## 2019-07-17 DIAGNOSIS — J449 Chronic obstructive pulmonary disease, unspecified: Secondary | ICD-10-CM | POA: Diagnosis not present

## 2019-07-17 DIAGNOSIS — Z44111 Encounter for fitting and adjustment of complete right artificial leg: Secondary | ICD-10-CM | POA: Diagnosis not present

## 2019-07-17 NOTE — Telephone Encounter (Signed)
Attempted to reach patient, no answer. LVM asking her to call back.  When she does return the call please let her know that I have set up medicaid transportation for her to go get an xray this Friday:  She should be ready by 8:40am.  They will arrive between 8:40 and 9:20 to get her to [place] by 10am.  They will pick her back up at [place] at 10:45am.  If she has further questions than this I am happy to speak with her.  [name], MD

## 2019-07-17 NOTE — Assessment & Plan Note (Signed)
Urine does appear milky with red tint - very unusual appearing. Will resend for culture. Await urology appointment. No empiric antibiotics at this time until culture has resulted, as prior cx were negative and she has been treated with many antibiotics already, and these are continuous symptoms.

## 2019-07-17 NOTE — Assessment & Plan Note (Signed)
>>  ASSESSMENT AND PLAN FOR INSULIN DEPENDENT TYPE 2 DIABETES MELLITUS ([place]) WRITTEN ON 07/17/2019  8:47 AM BY Naama Sappington, [name], MD  Encouraged to schedule follow up with endocrinology

## 2019-07-17 NOTE — Assessment & Plan Note (Signed)
Encouraged to schedule follow up with endocrinology

## 2019-07-17 NOTE — Assessment & Plan Note (Signed)
Continued pain in fingers though improving. Needs xrays but having barriers with transportation. I attempted to schedule an xray appointment for her but was told they do not schedule these. I will personally call the medicaid transportation office and advocate for her to get a ride to [place]. Will contact patient with transportation appointment.

## 2019-07-17 NOTE — Assessment & Plan Note (Signed)
Stable overall. [place] PMP reviewed with appropriate findings. Will refill x3 months, allowing early fill this first month due to being instructed to take pain medication more often several weeks back (confirmed in office visit notes).

## 2019-07-17 NOTE — Telephone Encounter (Signed)
Pt is returning Dr. Lennie Odor call . DR. [name] called earlier this morning.jw

## 2019-07-18 ENCOUNTER — Encounter (HOSPITAL_BASED_OUTPATIENT_CLINIC_OR_DEPARTMENT_OTHER): Payer: Medicaid Other | Attending: Internal Medicine

## 2019-07-18 DIAGNOSIS — J449 Chronic obstructive pulmonary disease, unspecified: Secondary | ICD-10-CM | POA: Insufficient documentation

## 2019-07-18 DIAGNOSIS — L0231 Cutaneous abscess of buttock: Secondary | ICD-10-CM | POA: Insufficient documentation

## 2019-07-18 DIAGNOSIS — I10 Essential (primary) hypertension: Secondary | ICD-10-CM | POA: Insufficient documentation

## 2019-07-18 DIAGNOSIS — E1140 Type 2 diabetes mellitus with diabetic neuropathy, unspecified: Secondary | ICD-10-CM | POA: Diagnosis not present

## 2019-07-18 DIAGNOSIS — L98492 Non-pressure chronic ulcer of skin of other sites with fat layer exposed: Secondary | ICD-10-CM | POA: Insufficient documentation

## 2019-07-18 DIAGNOSIS — Z794 Long term (current) use of insulin: Secondary | ICD-10-CM | POA: Insufficient documentation

## 2019-07-18 DIAGNOSIS — Z87891 Personal history of nicotine dependence: Secondary | ICD-10-CM | POA: Insufficient documentation

## 2019-07-18 NOTE — Telephone Encounter (Signed)
Called patient. She needed me to call her pharmacy & authorize them to fill her pain medication on 8/15 (which I already wrote on the prescription). Called them and got it straightened out. [name], MD

## 2019-07-18 NOTE — Telephone Encounter (Signed)
Pt is calling back and asking to speak with Dr. [name] again.

## 2019-07-18 NOTE — Telephone Encounter (Signed)
Reached patient - see separate phone encounter Belinda Rio, MD

## 2019-07-18 NOTE — Telephone Encounter (Signed)
Spoke with patient. Informed of medicaid transportation arranged. Also facilitated getting her an appointment with urologist. Soonest available appointment was 9/1 at 1:45pm. Patient given appointment info. Patient appreciative.  [name], MD

## 2019-07-19 DIAGNOSIS — E1140 Type 2 diabetes mellitus with diabetic neuropathy, unspecified: Secondary | ICD-10-CM | POA: Diagnosis not present

## 2019-07-20 DIAGNOSIS — E785 Hyperlipidemia, unspecified: Secondary | ICD-10-CM | POA: Diagnosis not present

## 2019-07-20 DIAGNOSIS — J329 Chronic sinusitis, unspecified: Secondary | ICD-10-CM | POA: Diagnosis not present

## 2019-07-20 DIAGNOSIS — L89326 Pressure-induced deep tissue damage of left buttock: Secondary | ICD-10-CM | POA: Diagnosis not present

## 2019-07-20 DIAGNOSIS — G4733 Obstructive sleep apnea (adult) (pediatric): Secondary | ICD-10-CM | POA: Diagnosis not present

## 2019-07-20 DIAGNOSIS — G2581 Restless legs syndrome: Secondary | ICD-10-CM | POA: Diagnosis not present

## 2019-07-20 DIAGNOSIS — E1140 Type 2 diabetes mellitus with diabetic neuropathy, unspecified: Secondary | ICD-10-CM | POA: Diagnosis not present

## 2019-07-20 DIAGNOSIS — I83893 Varicose veins of bilateral lower extremities with other complications: Secondary | ICD-10-CM | POA: Diagnosis not present

## 2019-07-20 DIAGNOSIS — B372 Candidiasis of skin and nail: Secondary | ICD-10-CM | POA: Diagnosis not present

## 2019-07-20 DIAGNOSIS — K219 Gastro-esophageal reflux disease without esophagitis: Secondary | ICD-10-CM | POA: Diagnosis not present

## 2019-07-20 DIAGNOSIS — M654 Radial styloid tenosynovitis [de Quervain]: Secondary | ICD-10-CM | POA: Diagnosis not present

## 2019-07-20 DIAGNOSIS — L02211 Cutaneous abscess of abdominal wall: Secondary | ICD-10-CM | POA: Diagnosis not present

## 2019-07-20 DIAGNOSIS — Z44111 Encounter for fitting and adjustment of complete right artificial leg: Secondary | ICD-10-CM | POA: Diagnosis not present

## 2019-07-20 DIAGNOSIS — E559 Vitamin D deficiency, unspecified: Secondary | ICD-10-CM | POA: Diagnosis not present

## 2019-07-20 DIAGNOSIS — T8789 Other complications of amputation stump: Secondary | ICD-10-CM | POA: Diagnosis not present

## 2019-07-20 DIAGNOSIS — F3181 Bipolar II disorder: Secondary | ICD-10-CM | POA: Diagnosis not present

## 2019-07-20 DIAGNOSIS — J449 Chronic obstructive pulmonary disease, unspecified: Secondary | ICD-10-CM | POA: Diagnosis not present

## 2019-07-20 DIAGNOSIS — E1151 Type 2 diabetes mellitus with diabetic peripheral angiopathy without gangrene: Secondary | ICD-10-CM | POA: Diagnosis not present

## 2019-07-20 DIAGNOSIS — E43 Unspecified severe protein-calorie malnutrition: Secondary | ICD-10-CM | POA: Diagnosis not present

## 2019-07-20 DIAGNOSIS — I10 Essential (primary) hypertension: Secondary | ICD-10-CM | POA: Diagnosis not present

## 2019-07-20 DIAGNOSIS — M1812 Unilateral primary osteoarthritis of first carpometacarpal joint, left hand: Secondary | ICD-10-CM | POA: Diagnosis not present

## 2019-07-20 DIAGNOSIS — Z44112 Encounter for fitting and adjustment of complete left artificial leg: Secondary | ICD-10-CM | POA: Diagnosis not present

## 2019-07-20 DIAGNOSIS — M1711 Unilateral primary osteoarthritis, right knee: Secondary | ICD-10-CM | POA: Diagnosis not present

## 2019-07-20 DIAGNOSIS — E1142 Type 2 diabetes mellitus with diabetic polyneuropathy: Secondary | ICD-10-CM | POA: Diagnosis not present

## 2019-07-20 DIAGNOSIS — G8929 Other chronic pain: Secondary | ICD-10-CM | POA: Diagnosis not present

## 2019-07-20 DIAGNOSIS — D649 Anemia, unspecified: Secondary | ICD-10-CM | POA: Diagnosis not present

## 2019-07-21 DIAGNOSIS — E1140 Type 2 diabetes mellitus with diabetic neuropathy, unspecified: Secondary | ICD-10-CM | POA: Diagnosis not present

## 2019-07-22 DIAGNOSIS — E1140 Type 2 diabetes mellitus with diabetic neuropathy, unspecified: Secondary | ICD-10-CM | POA: Diagnosis not present

## 2019-07-23 DIAGNOSIS — E1140 Type 2 diabetes mellitus with diabetic neuropathy, unspecified: Secondary | ICD-10-CM | POA: Diagnosis not present

## 2019-07-24 DIAGNOSIS — E1140 Type 2 diabetes mellitus with diabetic neuropathy, unspecified: Secondary | ICD-10-CM | POA: Diagnosis not present

## 2019-07-25 DIAGNOSIS — E113393 Type 2 diabetes mellitus with moderate nonproliferative diabetic retinopathy without macular edema, bilateral: Secondary | ICD-10-CM | POA: Diagnosis not present

## 2019-07-25 DIAGNOSIS — Z794 Long term (current) use of insulin: Secondary | ICD-10-CM | POA: Diagnosis not present

## 2019-07-25 DIAGNOSIS — H25813 Combined forms of age-related cataract, bilateral: Secondary | ICD-10-CM | POA: Diagnosis not present

## 2019-07-25 DIAGNOSIS — E1140 Type 2 diabetes mellitus with diabetic neuropathy, unspecified: Secondary | ICD-10-CM | POA: Diagnosis not present

## 2019-07-25 DIAGNOSIS — H04123 Dry eye syndrome of bilateral lacrimal glands: Secondary | ICD-10-CM | POA: Diagnosis not present

## 2019-07-25 LAB — HM DIABETES EYE EXAM

## 2019-07-26 DIAGNOSIS — E1140 Type 2 diabetes mellitus with diabetic neuropathy, unspecified: Secondary | ICD-10-CM | POA: Diagnosis not present

## 2019-07-27 DIAGNOSIS — E1140 Type 2 diabetes mellitus with diabetic neuropathy, unspecified: Secondary | ICD-10-CM | POA: Diagnosis not present

## 2019-07-28 DIAGNOSIS — E1140 Type 2 diabetes mellitus with diabetic neuropathy, unspecified: Secondary | ICD-10-CM | POA: Diagnosis not present

## 2019-07-29 DIAGNOSIS — E1140 Type 2 diabetes mellitus with diabetic neuropathy, unspecified: Secondary | ICD-10-CM | POA: Diagnosis not present

## 2019-07-30 DIAGNOSIS — E1140 Type 2 diabetes mellitus with diabetic neuropathy, unspecified: Secondary | ICD-10-CM | POA: Diagnosis not present

## 2019-07-31 DIAGNOSIS — E1140 Type 2 diabetes mellitus with diabetic neuropathy, unspecified: Secondary | ICD-10-CM | POA: Diagnosis not present

## 2019-08-01 ENCOUNTER — Other Ambulatory Visit: Payer: Self-pay

## 2019-08-01 DIAGNOSIS — L98492 Non-pressure chronic ulcer of skin of other sites with fat layer exposed: Secondary | ICD-10-CM | POA: Diagnosis not present

## 2019-08-01 DIAGNOSIS — I10 Essential (primary) hypertension: Secondary | ICD-10-CM | POA: Diagnosis not present

## 2019-08-01 DIAGNOSIS — E1140 Type 2 diabetes mellitus with diabetic neuropathy, unspecified: Secondary | ICD-10-CM | POA: Diagnosis not present

## 2019-08-01 DIAGNOSIS — L0231 Cutaneous abscess of buttock: Secondary | ICD-10-CM | POA: Diagnosis not present

## 2019-08-01 DIAGNOSIS — Z794 Long term (current) use of insulin: Secondary | ICD-10-CM | POA: Diagnosis not present

## 2019-08-01 DIAGNOSIS — L98495 Non-pressure chronic ulcer of skin of other sites with muscle involvement without evidence of necrosis: Secondary | ICD-10-CM | POA: Diagnosis not present

## 2019-08-01 DIAGNOSIS — J449 Chronic obstructive pulmonary disease, unspecified: Secondary | ICD-10-CM | POA: Diagnosis not present

## 2019-08-01 DIAGNOSIS — Z87891 Personal history of nicotine dependence: Secondary | ICD-10-CM | POA: Diagnosis not present

## 2019-08-01 DIAGNOSIS — L98415 Non-pressure chronic ulcer of buttock with muscle involvement without evidence of necrosis: Secondary | ICD-10-CM | POA: Diagnosis not present

## 2019-08-02 DIAGNOSIS — E1140 Type 2 diabetes mellitus with diabetic neuropathy, unspecified: Secondary | ICD-10-CM | POA: Diagnosis not present

## 2019-08-03 ENCOUNTER — Telehealth: Payer: Self-pay | Admitting: Family Medicine

## 2019-08-03 DIAGNOSIS — E1140 Type 2 diabetes mellitus with diabetic neuropathy, unspecified: Secondary | ICD-10-CM | POA: Diagnosis not present

## 2019-08-03 NOTE — Telephone Encounter (Signed)
**  After Hours/ Emergency Line Call**  Received a call to report that [name].  Endorsing finger pain with the some redness and swelling. She is still able to move her finger and has feeling in her finger, but he current home medications. She reports she has been seen in clinic multiple times and went to the ED today, but no one could help her and was quite frustrated. I tried to reassure her, but she was quite frustrated. Denying fever, streaking.  Recommended that she call the office and be scheduled, as most spots were filled but she could call and try to do a work in today if able as she will need to have her hand evaluated, given that I have not seen her hand over the phone. Red flags discussed.  Will forward to PCP.  [place] Adebayo Ensminger, DO PGY-3, [facility] 08/03/2019 8:07 AM

## 2019-08-04 DIAGNOSIS — E1140 Type 2 diabetes mellitus with diabetic neuropathy, unspecified: Secondary | ICD-10-CM | POA: Diagnosis not present

## 2019-08-04 DIAGNOSIS — J45909 Unspecified asthma, uncomplicated: Secondary | ICD-10-CM | POA: Diagnosis not present

## 2019-08-04 DIAGNOSIS — G4733 Obstructive sleep apnea (adult) (pediatric): Secondary | ICD-10-CM | POA: Diagnosis not present

## 2019-08-05 DIAGNOSIS — E1140 Type 2 diabetes mellitus with diabetic neuropathy, unspecified: Secondary | ICD-10-CM | POA: Diagnosis not present

## 2019-08-06 DIAGNOSIS — E1140 Type 2 diabetes mellitus with diabetic neuropathy, unspecified: Secondary | ICD-10-CM | POA: Diagnosis not present

## 2019-08-07 DIAGNOSIS — E1140 Type 2 diabetes mellitus with diabetic neuropathy, unspecified: Secondary | ICD-10-CM | POA: Diagnosis not present

## 2019-08-08 ENCOUNTER — Telehealth: Payer: Self-pay | Admitting: Family Medicine

## 2019-08-08 ENCOUNTER — Telehealth: Payer: Self-pay | Admitting: Internal Medicine

## 2019-08-08 DIAGNOSIS — E1140 Type 2 diabetes mellitus with diabetic neuropathy, unspecified: Secondary | ICD-10-CM | POA: Diagnosis not present

## 2019-08-08 NOTE — Telephone Encounter (Signed)
Please schedule

## 2019-08-08 NOTE — Telephone Encounter (Signed)
Please advise 

## 2019-08-08 NOTE — Telephone Encounter (Signed)
Patient ph# [phone] requests to be scheduled for a virtual appointment. Patient states it is hard for her to get out of the house. Please advise.

## 2019-08-08 NOTE — Telephone Encounter (Signed)
Patient called asking to speak to Dr. [name] or Dr. Owens Shark. Patient has questions concerning her x-rays and would like to "change them." Please call patient back to discuss.

## 2019-08-08 NOTE — Telephone Encounter (Signed)
Patient is scheduled for a DOXY appointment on 08/15/19 at 3:20 p.m.

## 2019-08-08 NOTE — Telephone Encounter (Signed)
Called and discussed with patient - she needed to be sure her xrays ordered are for both of her hands. I assured her the orders I placed last month are for both L hand and R hand. Patient plans to get xrays tomorrow. Patient appreciative.  [name], MD

## 2019-08-09 ENCOUNTER — Telehealth: Payer: Medicaid Other | Admitting: Family Medicine

## 2019-08-09 DIAGNOSIS — E1140 Type 2 diabetes mellitus with diabetic neuropathy, unspecified: Secondary | ICD-10-CM | POA: Diagnosis not present

## 2019-08-10 DIAGNOSIS — E1140 Type 2 diabetes mellitus with diabetic neuropathy, unspecified: Secondary | ICD-10-CM | POA: Diagnosis not present

## 2019-08-11 DIAGNOSIS — E1140 Type 2 diabetes mellitus with diabetic neuropathy, unspecified: Secondary | ICD-10-CM | POA: Diagnosis not present

## 2019-08-12 DIAGNOSIS — E1140 Type 2 diabetes mellitus with diabetic neuropathy, unspecified: Secondary | ICD-10-CM | POA: Diagnosis not present

## 2019-08-13 DIAGNOSIS — E1140 Type 2 diabetes mellitus with diabetic neuropathy, unspecified: Secondary | ICD-10-CM | POA: Diagnosis not present

## 2019-08-14 DIAGNOSIS — E1140 Type 2 diabetes mellitus with diabetic neuropathy, unspecified: Secondary | ICD-10-CM | POA: Diagnosis not present

## 2019-08-15 ENCOUNTER — Encounter: Payer: Self-pay | Admitting: Internal Medicine

## 2019-08-15 ENCOUNTER — Other Ambulatory Visit: Payer: Self-pay

## 2019-08-15 ENCOUNTER — Ambulatory Visit (INDEPENDENT_AMBULATORY_CARE_PROVIDER_SITE_OTHER): Payer: Medicaid Other | Admitting: Internal Medicine

## 2019-08-15 ENCOUNTER — Encounter (HOSPITAL_BASED_OUTPATIENT_CLINIC_OR_DEPARTMENT_OTHER): Payer: Medicaid Other

## 2019-08-15 DIAGNOSIS — E1142 Type 2 diabetes mellitus with diabetic polyneuropathy: Secondary | ICD-10-CM

## 2019-08-15 DIAGNOSIS — E1165 Type 2 diabetes mellitus with hyperglycemia: Secondary | ICD-10-CM

## 2019-08-15 DIAGNOSIS — E1140 Type 2 diabetes mellitus with diabetic neuropathy, unspecified: Secondary | ICD-10-CM | POA: Diagnosis not present

## 2019-08-15 DIAGNOSIS — Z794 Long term (current) use of insulin: Secondary | ICD-10-CM

## 2019-08-15 MED ORDER — OZEMPIC (0.25 OR 0.5 MG/DOSE) 2 MG/1.5ML ~~LOC~~ SOPN: 0.5000 mg | PEN_INJECTOR | SUBCUTANEOUS | 4 refills | Status: DC

## 2019-08-15 NOTE — Progress Notes (Signed)
Virtual Visit via Video Note  I connected with [name] on 08/15/19 at  3:20 PM EDT by a video enabled telemedicine application and verified that I am speaking with the correct person using two identifiers.   I discussed the limitations of evaluation and management by telemedicine and the availability of in person appointments. The patient expressed understanding and agreed to proceed.   -Location of the patient : Home -Location of the provider : Office -The names of all persons participating in the telemedicine service : Pt and myself        Name: [name]  Age/ Sex: 43 y.o., female   MRN/ DOB: [id number], [birth year]     PCP: [name], MD   Reason for Endocrinology Evaluation: Type 2 Diabetes Mellitus     Initial Endocrinology Clinic Visit: 05/13/2014    PATIENT IDENTIFIER: [name] is a 43 y.o. 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 The patient has followed with Endocrinology clinic since 05/13/2014 for consultative assistance with management of her diabetes.  DIABETIC HISTORY:  [name] was diagnosed with T2DM in 2001 when she was hospitalized for symptomatic hyperglycemia. She was started insulin therapy in 2007. Her hemoglobin A1c has ranged from 9.6% in 2019, peaking at 14.6% in 2012.  On her initial visit to our clinic her A1c was 14.0% . She was on Metformin, byetta , novolog and Lantus   She is intolerant to metformin She was lost to follow from 2018-2020, upon her presentation back to our clinic in 2020, she was on Novolog Mix 70/30 .   SUBJECTIVE:   During the last visit (08/15/19): We increased morning Humulin Mix to 70 units and decreased evening dose to 35 units  Today (08/15/2019): [name] is here for a virtual follow up visit on diabetes management. She checks her blood sugars 1 times daily, preprandial to breakfast. The patient has not had  hypoglycemic episodes since the last clinic visit. Otherwise, the patient has not required any recent emergency interventions for hypoglycemia but has presented to the ED in 06/2019. Pt tells me she was treated for an buttock abscess, S/P I&D, but in review of car everywhere, I could only find notes about chest pain. She admits to missing her insulin at times.   ROS: As per HPI and as detailed below: Review of Systems  Constitutional: Negative for fever.  HENT: Negative for congestion and sore throat.   Respiratory: Negative for cough and shortness of breath.   Cardiovascular: Negative for chest pain and palpitations.  Gastrointestinal: Negative for diarrhea and nausea.  Genitourinary: Positive for frequency.  Neurological: Positive for tingling.  Endo/Heme/Allergies: Positive for polydipsia.      HOME DIABETES REGIMEN:  Humulin Mix (70/30) 70 units QAM, 35 units QPM  Statin: yes ACE-I/ARB: no   GLUCOSE LOG:  Date Breakfast  Supper  08/15/2019 120 -  9/8 150 -  9/7 110 -  9/6 120 -     DIABETIC COMPLICATIONS: Microvascular complications:   Neuropathy( S/P Left BKA and Right toes amputations)  Denies: retinopathy, CKD  Last Eye Exam: Completed 04/2018  Macrovascular complications:    Denies: CAD, CVA, PVD   HISTORY:  Past Medical History:  Past Medical History:  Diagnosis Date  . Acute osteomyelitis of ankle or foot, left ([place]) 01/31/2018  . Alveolar hypoventilation   . Anemia    not on iron pill  .  Asthma   . Bipolar 2 disorder ([place])   . Carpal tunnel syndrome on right    recurrent  . Cellulitis 08/2010-08/2011  . Chronic pain   . COPD (chronic obstructive pulmonary disease) ([name])    Symbicort daily and Proventil as needed  . Costochondritis   . Depression   . Diabetes mellitus type II, uncontrolled ([place]) 2000   Type 2, Uncontrolled.Takes Lantus daily.Fasting blood sugar runs 150  . Dizziness    occasionally  . Drug-seeking behavior   . GERD  (gastroesophageal reflux disease)    takes Pantoprazole and Zantac daily  . Headache    migraine-last one about a yr ago.Topamax daily  . HLD (hyperlipidemia)    takes Atorvastatin daily  . Hypertension    takes Lisinopril and Coreg daily  . Morbid obesity ([place])   . Muscle spasm    takes Flexeril as needed  . Nocturia   . OSA on CPAP   . Peripheral neuropathy    takes Gabapentin daily  . Pneumonia    "walking" several yrs ago and as a baby (12/05/2018)  . Rectal fissure   . Restless leg   . Syncope 02/25/2016  . Urinary frequency   . Varicose veins    Right medial thigh and Left leg    Past Surgical History:  Past Surgical History:  Procedure Laterality Date  . AMPUTATION Left 02/01/2018   Procedure: LEFT FOURTH AND 5TH TOE RAY AMPUTATION;  Surgeon: [name], MD;  Location: [facility];  Service: Orthopedics;  Laterality: Left;  . AMPUTATION Left 03/03/2018   Procedure: LEFT BELOW KNEE AMPUTATION;  Surgeon: [name], MD;  Location: [facility];  Service: Orthopedics;  Laterality: Left;  . CARPAL TUNNEL RELEASE Bilateral   . CESAREAN SECTION  2007  . INCISION AND DRAINAGE PERIRECTAL ABSCESS Left 05/18/2019   Procedure: IRRIGATION AND DEBRIDEMENT OF PANNIS ABSCESS, POSSIBLE DEBRIDEMENT OF BUTTOCK WOUND;  Surgeon: Donnie Mesa, MD;  Location: [facility];  Service: General;  Laterality: Left;  . IRRIGATION AND DEBRIDEMENT BUTTOCKS Left 05/17/2019   Procedure: IRRIGATION AND DEBRIDEMENT BUTTOCKS;  Surgeon: Donnie Mesa, MD;  Location: [facility];  Service: General;  Laterality: Left;  . KNEE ARTHROSCOPY Right 07/17/2010  . LEFT HEART CATHETERIZATION WITH CORONARY ANGIOGRAM N/A 07/27/2012   Procedure: LEFT HEART CATHETERIZATION WITH CORONARY ANGIOGRAM;  Surgeon: Sherren Mocha, MD;  Location: [facility] CATH LAB;  Service: Cardiovascular;  Laterality: N/A;  . MASS EXCISION N/A 06/29/2013   Procedure:  WIDE LOCAL EXCISION OF POSTERIOR NECK ABSCESS;  Surgeon: Ralene Ok, MD;  Location: [facility];   Service: General;  Laterality: N/A;  . REPAIR KNEE LIGAMENT Left    "fixed ligaments and chipped patella"  . right transmetatarsal amputation       Social History:  reports that she quit smoking about 25 years ago. Her smoking use included cigarettes. She has a 0.09 pack-year smoking history. She has never used smokeless tobacco. She reports current drug use. She reports that she does not drink alcohol. Family History:  Family History  Problem Relation Age of Onset  . Diabetes Mother   . Hyperlipidemia Mother   . Depression Mother   . Varicose Veins Mother   . Heart attack Paternal Uncle   . Heart disease Paternal Grandmother   . Heart attack Paternal Grandmother   . Heart attack Paternal Grandfather   . Heart disease Paternal Grandfather   . Heart attack Father   . Cancer Maternal Grandmother  COLON  . Hypertension Maternal Grandmother   . Hyperlipidemia Maternal Grandmother   . Diabetes Maternal Grandmother   . Other Maternal Grandfather        GUN SHOT     HOME MEDICATIONS: Allergies as of 08/15/2019      Reactions   Kiwi Extract Shortness Of Breath, Swelling   Cefepime    AKI, see records from [facility] hospitalization in January 2020   Trental [pentoxifylline] Nausea And Vomiting   Nubain [nalbuphine Hcl] Other (See Comments)   "FEELS LIKE SOMETHING CRAWLING ON ME"      Medication List       Accurate as of August 15, 2019  4:13 PM. If you have any questions, ask your nurse or doctor.        Accu-Chek Nano SmartView w/Device Kit Use to check blood sugar 3 times daily   Accu-Chek Softclix Lancets lancets Use as instructed to check blood sugar 3 times per day   amLODipine 10 MG tablet Commonly known as: NORVASC Take 1 tablet (10 mg total) by mouth daily.   atorvastatin 20 MG tablet Commonly known as: LIPITOR Take 1 tablet (20 mg total) by mouth daily.   budesonide-formoterol 160-4.5 MCG/ACT inhaler Commonly known as: Symbicort inhale 2 PUFFS into THE  lungs 2 TIMES DAILY   Cholecalciferol 25 MCG (1000 UT) tablet Take 1 tablet (1,000 Units total) by mouth daily.   cyclobenzaprine 5 MG tablet Commonly known as: FLEXERIL TAKE 1 TABLET BY MOUTH 3 TIMES DAILY AS NEEDED FOR MUSCLE SPASMS What changed: See the new instructions.   diclofenac sodium 1 % Gel Commonly known as: VOLTAREN Apply 2 g topically 4 (four) times daily.   gabapentin 600 MG tablet Commonly known as: NEURONTIN TAKE 2 TABLETS BY MOUTH 3 TIMES DAILY What changed: See the new instructions.   glucose blood test strip Commonly known as: Accu-Chek Guide USE T0 CHECK BLOOD SUGAR 3 TIMES DAILY   HYDROcodone-acetaminophen 10-325 MG tablet Commonly known as: NORCO Take 1 tablet by mouth every 8 (eight) hours as needed.   HYDROcodone-acetaminophen 10-325 MG tablet Commonly known as: NORCO Take 1 tablet by mouth every 8 (eight) hours as needed.   HYDROcodone-acetaminophen 10-325 MG tablet Commonly known as: NORCO Take 1 tablet by mouth every 8 (eight) hours as needed.   insulin glargine 100 UNIT/ML injection Commonly known as: LANTUS Inject 0.4 mLs (40 Units total) into the skin daily.   Insulin Isophane & Regular Human (70-30) 100 UNIT/ML PEN Commonly known as: HUMULIN 70/30 MIX Inject 70 Units into the skin daily with breakfast AND 35 Units daily with supper.   nystatin cream Commonly known as: MYCOSTATIN Apply topically 2 (two) times daily.   Ozempic (0.25 or 0.5 MG/DOSE) 2 MG/1.5ML Sopn Generic drug: Semaglutide(0.25 or 0.5MG/DOS) Inject 0.5 mg into the skin once a week. Started by: Dorita Sciara, MD   pantoprazole 40 MG tablet Commonly known as: PROTONIX Take 1 tablet (40 mg total) by mouth daily.   promethazine 12.5 MG tablet Commonly known as: PHENERGAN Take 1 tablet (12.5 mg total) by mouth every 6 (six) hours as needed for nausea or vomiting.   Proventil HFA 108 (90 Base) MCG/ACT inhaler Generic drug: albuterol Inhale 1-2 puffs into the  lungs every 6 (six) hours as needed for wheezing or shortness of breath. What changed: See the new instructions.         DATA REVIEWED:  Lab Results  Component Value Date   HGBA1C 17.5 (H) 05/17/2019   HGBA1C  10.1 (H) 12/06/2018   HGBA1C 9.6 (A) 04/25/2018   Lab Results  Component Value Date   MICROALBUR 17.4 (H) 01/24/2015   [place] Comment 09/19/2018   CREATININE 0.97 06/22/2019    ASSESSMENT / PLAN / RECOMMENDATIONS:   1) Type 2 Diabetes Mellitus, Poorly controlled, With neuropathic complications - Most recent A1c of 17.5 %. Goal A1c < 7.0 %.    - Today was a virtual visit and she provided [place] with "Optimal readings" fasting BG's 120-150 , which were similar to her readings from May, 2020 but her A1c in 05/2019  was > 17.0%.  Also when I looked at serum glucose from her ED visit from July, her BG's were > 400 mg/dL.  - Unfortunately I am limited with these virtual visit as I can't personally look at her meter or download, so will not be making any changes at this time   - She is intolerant to Metformin - We discussed adding a GLP-1 agonist, discussed GI side effects, no prior hx of pancreatitis. No FH of medullary cancer   MEDICATIONS:  Humulin mix (70/30)  70 units with Breakfast and  35 units with Supper   Ozempic 0.25 mg weekly, after 6 weeks may increase to 0.5 mg weekly if no side effects   EDUCATION / INSTRUCTIONS:  BG monitoring instructions: Patient is instructed to check her blood sugars 2 times a day, fasting and supper time.  Call [facility] if: BG persistently < 70 or > 300. . I reviewed the Rule of 15 for the treatment of hypoglycemia in detail with the patient. Literature supplied.   I discussed the assessment and treatment plan with the patient. The patient was provided an opportunity to ask questions and all were answered. The patient agreed with the plan and demonstrated an understanding of the instructions.   The patient was  advised to call back or seek an in-person evaluation if the symptoms worsen or if the condition fails to improve as anticipated.    F/U in 2 months    Signed electronically by: Mack Guise, MD  [facility] Endocrinology  [facility] [address] [place], [place] [address] Phone: [phone] FAX: [phone]   CC: [name], [place] [address] Phone: [phone]  Fax: [phone]  Return to Endocrinology clinic as below: Future Appointments  Date Time Provider [place]  08/20/2019  4:10 PM [name], MD [facility]  08/24/2019  2:15 PM [place] [facility] [facility]

## 2019-08-16 ENCOUNTER — Telehealth: Payer: Self-pay

## 2019-08-16 DIAGNOSIS — E1140 Type 2 diabetes mellitus with diabetic neuropathy, unspecified: Secondary | ICD-10-CM | POA: Diagnosis not present

## 2019-08-16 NOTE — Telephone Encounter (Signed)
Approval for ozempic [id number] valid from 08/16/2019-08/10/2020 [place] [employer]

## 2019-08-17 DIAGNOSIS — E1140 Type 2 diabetes mellitus with diabetic neuropathy, unspecified: Secondary | ICD-10-CM | POA: Diagnosis not present

## 2019-08-18 DIAGNOSIS — E1140 Type 2 diabetes mellitus with diabetic neuropathy, unspecified: Secondary | ICD-10-CM | POA: Diagnosis not present

## 2019-08-19 DIAGNOSIS — E1140 Type 2 diabetes mellitus with diabetic neuropathy, unspecified: Secondary | ICD-10-CM | POA: Diagnosis not present

## 2019-08-20 ENCOUNTER — Other Ambulatory Visit: Payer: Self-pay

## 2019-08-20 ENCOUNTER — Telehealth (INDEPENDENT_AMBULATORY_CARE_PROVIDER_SITE_OTHER): Payer: Medicaid Other | Admitting: Family Medicine

## 2019-08-20 DIAGNOSIS — E1140 Type 2 diabetes mellitus with diabetic neuropathy, unspecified: Secondary | ICD-10-CM | POA: Diagnosis not present

## 2019-08-20 DIAGNOSIS — G8929 Other chronic pain: Secondary | ICD-10-CM | POA: Diagnosis not present

## 2019-08-20 DIAGNOSIS — Z89512 Acquired absence of left leg below knee: Secondary | ICD-10-CM

## 2019-08-20 NOTE — Progress Notes (Signed)
[facility] Visit  Patient consented to have virtual visit. Method of visit: Telephone  Encounter participants: Patient: [name] - located at home Provider: [name] - located at office Others (if applicable): n/a  Chief Complaint: follow up   HPI:  Patient seen via phone visit for follow up on several issues.  S/p amputation - wants referral back to physical therapy (neuro rehab to work with amputee specialist). Also needs rx for prescription for toe filler for shoe R foot faxed to [facility].   F/u after urology referral - reports she did not see urology as her urine cleared up on its own. Thinks it was related to medication she was taking at that time. No burning with urination or any other urinary issues now.   Hand pain - hasn't been able to get xrays yet. Going to work on getting that, has had continued issues with transportation. Pain stable overall.  Form completion - will need a form completed for her section 8 housing asking for her to have an additional bedroom. Her medical equipment (hospital bed, other equipment) sits in her kitchen as there is no space for it anywhere else. She will have a form sent or dropped off for me to complete.   ROS: per HPI  Pertinent PMHx: history of diabetes, obeisty, depression, hypertension, asthma, GERD, hyperlipidemia, chronic pain, diabetic neuropathy  Exam:  Respiratory: Patient speaking normally in full sentences throughout the encounter, without any respiratory distress evident.   Assessment/Plan:  Encounter for chronic pain management Overall stable, previously refilled her narcotics, is not due for refills at this time. She is out of flexeril so I will send this in.  Left below-knee amputee [facility]) Refer to physical therapy by patient's request. [facility] also [facility] filler for her R shoe   Hand pain - Encouraged to get xrays of her hands  Housing - Will  complete section 8 housing form when she drops it off. Will also send message to CSW to contact patient about housing concerns - she is having to get out of her current housing as they are selling it. Would benefit from housing resources.   Time spent during visit with patient: 9 minutes

## 2019-08-21 DIAGNOSIS — E1140 Type 2 diabetes mellitus with diabetic neuropathy, unspecified: Secondary | ICD-10-CM | POA: Diagnosis not present

## 2019-08-22 DIAGNOSIS — E1140 Type 2 diabetes mellitus with diabetic neuropathy, unspecified: Secondary | ICD-10-CM | POA: Diagnosis not present

## 2019-08-23 ENCOUNTER — Telehealth: Payer: Self-pay | Admitting: Family Medicine

## 2019-08-23 ENCOUNTER — Telehealth: Payer: Self-pay | Admitting: Licensed Clinical Social Worker

## 2019-08-23 DIAGNOSIS — E1140 Type 2 diabetes mellitus with diabetic neuropathy, unspecified: Secondary | ICD-10-CM | POA: Diagnosis not present

## 2019-08-23 MED ORDER — CYCLOBENZAPRINE HCL 5 MG PO TABS: ORAL_TABLET | ORAL | 1 refills | Status: DC

## 2019-08-23 NOTE — Telephone Encounter (Signed)
   Unsuccessful Phone Outreach Note  08/23/2019 Name: Berthamae Kondo MRN: [id number] DOB: 09-05-1976  Referred by: [name], MD Reason for referral : [employer] (for housing )   Frederick Mershon is a 43 y.o. year old female who sees [name], [name], MD for primary care.  LCSW received referral to assist patient with housing resources.  Patient has to move out of her section 8 housing due to the property being sold.   Called patient to assess needs and barriers reference the above referral. Telephone outreach was unsuccessful. A HIPPA compliant phone message was left for the patient providing contact information and requesting a return call.  Plan:  If no return call is received. LCSW will call again in 3 business days.  Casimer Lanius, LCSW Clinical Social Worker [place] / [place]   [phone] 3:45 PM

## 2019-08-23 NOTE — Telephone Encounter (Addendum)
Care Coordination Phone outreach Note Social Work   08/23/2019 Name: [name] MRN: [id number] DOB: 1976-10-02  [name] is a 43 y.o. year old female who sees [name], [name], MD for primary care. LCSW was consulted for assistance with housing resources. Received return call from patient to assess needs and barriers. Assessment : Patient is currently living in Sec.8 housing. She has to move in two weeks due to current property being sold.  Patient needs wheelchair assessable housing.  She has contact [employer], [employer] as well as reviewed all available section 8 housing on [email or web address] with no luck of finding anything. Interventions:Provided patient with information about [employer].   Plan:  1. Client will call resources provided 2. No further follow up required: patient will call if needed  Dr. [name] has been informed of this outreach and plan.  Casimer Lanius, LCSW Clinical Social Worker [place] / [place]   [phone] 4:14 PM

## 2019-08-23 NOTE — Telephone Encounter (Signed)
Attempted to reach patient to ask her a question in follow up from our visit on Monday. No answer. LVM asking for her to call back.  Please let me know when patient returns the call [name], MD

## 2019-08-23 NOTE — Assessment & Plan Note (Signed)
Overall stable, previously refilled her narcotics, is not due for refills at this time. She is out of flexeril so I will send this in.

## 2019-08-23 NOTE — Assessment & Plan Note (Signed)
Refer to physical therapy by patient's request. Will also send rx to [facility] for toe filler for her R shoe

## 2019-08-24 ENCOUNTER — Encounter (HOSPITAL_BASED_OUTPATIENT_CLINIC_OR_DEPARTMENT_OTHER): Payer: Medicaid Other | Attending: Physician Assistant

## 2019-08-24 DIAGNOSIS — E1140 Type 2 diabetes mellitus with diabetic neuropathy, unspecified: Secondary | ICD-10-CM | POA: Diagnosis not present

## 2019-08-25 DIAGNOSIS — E1140 Type 2 diabetes mellitus with diabetic neuropathy, unspecified: Secondary | ICD-10-CM | POA: Diagnosis not present

## 2019-08-25 IMAGING — DX DG KNEE 1-2V*L*
2 series · 2 of 2 positions shown · non-contrast
Comparison: Left knee radiographs performed 03/18/2018

CLINICAL DATA: Status post fall, with pain at the patient's left
below the knee amputation site. Initial encounter.

EXAM:
LEFT KNEE - 1-2 VIEW

[knee ap]
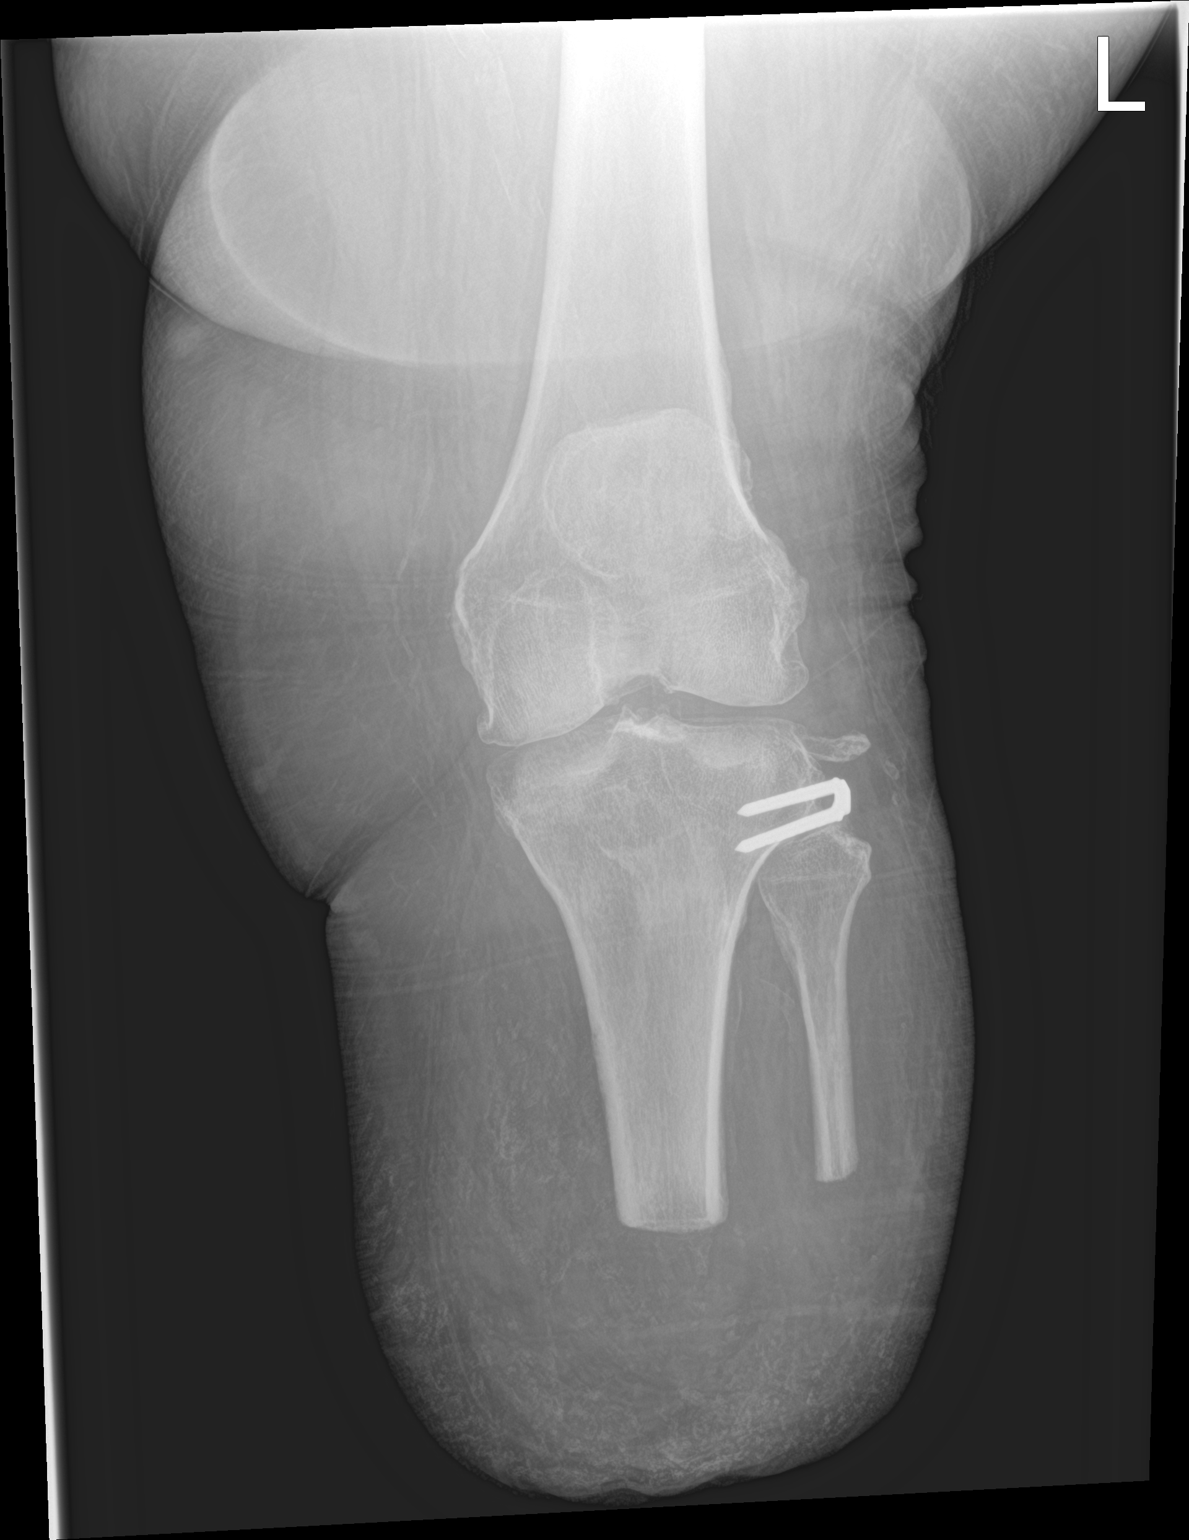

[knee lat]
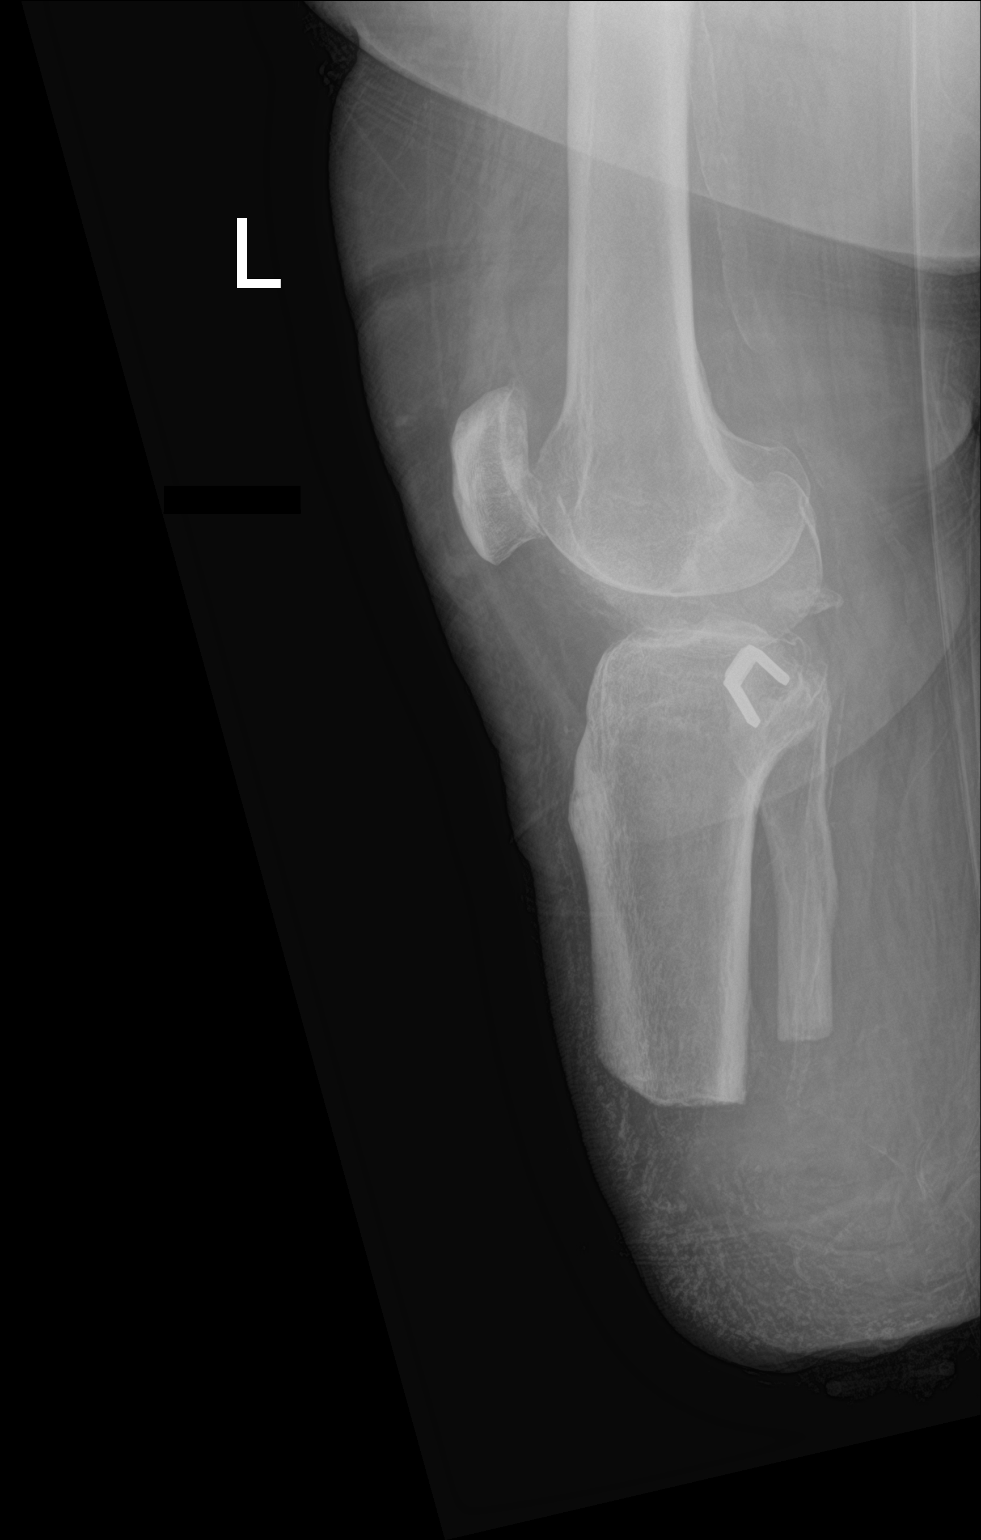

[2 of 2 positions shown; findings below may reference images not displayed]

FINDINGS: There is no evidence of fracture or dislocation. The below the knee
amputation is grossly unremarkable in appearance. No osseous
erosions are seen. The soft tissues are grossly unremarkable in
appearance, though not well assessed on radiograph.

No knee joint effusion is identified. Postoperative change is noted
at the proximal lateral tibia. An apparent loose body is noted at
the lateral aspect of the knee. Mild marginal osteophyte formation
is noted at the medial compartment.
IMPRESSION: No evidence of fracture or dislocation. Below the knee amputation is
grossly unremarkable in appearance.

## 2019-08-26 DIAGNOSIS — E1140 Type 2 diabetes mellitus with diabetic neuropathy, unspecified: Secondary | ICD-10-CM | POA: Diagnosis not present

## 2019-08-27 DIAGNOSIS — E1140 Type 2 diabetes mellitus with diabetic neuropathy, unspecified: Secondary | ICD-10-CM | POA: Diagnosis not present

## 2019-08-28 DIAGNOSIS — E1140 Type 2 diabetes mellitus with diabetic neuropathy, unspecified: Secondary | ICD-10-CM | POA: Diagnosis not present

## 2019-08-29 DIAGNOSIS — E1140 Type 2 diabetes mellitus with diabetic neuropathy, unspecified: Secondary | ICD-10-CM | POA: Diagnosis not present

## 2019-08-30 DIAGNOSIS — E1140 Type 2 diabetes mellitus with diabetic neuropathy, unspecified: Secondary | ICD-10-CM | POA: Diagnosis not present

## 2019-08-31 DIAGNOSIS — E1140 Type 2 diabetes mellitus with diabetic neuropathy, unspecified: Secondary | ICD-10-CM | POA: Diagnosis not present

## 2019-09-01 DIAGNOSIS — E1140 Type 2 diabetes mellitus with diabetic neuropathy, unspecified: Secondary | ICD-10-CM | POA: Diagnosis not present

## 2019-09-02 DIAGNOSIS — E1140 Type 2 diabetes mellitus with diabetic neuropathy, unspecified: Secondary | ICD-10-CM | POA: Diagnosis not present

## 2019-09-03 DIAGNOSIS — E1140 Type 2 diabetes mellitus with diabetic neuropathy, unspecified: Secondary | ICD-10-CM | POA: Diagnosis not present

## 2019-09-04 DIAGNOSIS — E1140 Type 2 diabetes mellitus with diabetic neuropathy, unspecified: Secondary | ICD-10-CM | POA: Diagnosis not present

## 2019-09-05 DIAGNOSIS — E1140 Type 2 diabetes mellitus with diabetic neuropathy, unspecified: Secondary | ICD-10-CM | POA: Diagnosis not present

## 2019-09-06 DIAGNOSIS — E1140 Type 2 diabetes mellitus with diabetic neuropathy, unspecified: Secondary | ICD-10-CM | POA: Diagnosis not present

## 2019-09-07 DIAGNOSIS — E1140 Type 2 diabetes mellitus with diabetic neuropathy, unspecified: Secondary | ICD-10-CM | POA: Diagnosis not present

## 2019-09-08 DIAGNOSIS — E1140 Type 2 diabetes mellitus with diabetic neuropathy, unspecified: Secondary | ICD-10-CM | POA: Diagnosis not present

## 2019-09-10 DIAGNOSIS — E1140 Type 2 diabetes mellitus with diabetic neuropathy, unspecified: Secondary | ICD-10-CM | POA: Diagnosis not present

## 2019-09-11 DIAGNOSIS — E1140 Type 2 diabetes mellitus with diabetic neuropathy, unspecified: Secondary | ICD-10-CM | POA: Diagnosis not present

## 2019-09-12 ENCOUNTER — Ambulatory Visit: Payer: Medicaid Other | Attending: Family Medicine | Admitting: Physical Therapy

## 2019-09-12 ENCOUNTER — Other Ambulatory Visit: Payer: Self-pay

## 2019-09-12 ENCOUNTER — Encounter: Payer: Self-pay | Admitting: Physical Therapy

## 2019-09-12 DIAGNOSIS — M6281 Muscle weakness (generalized): Secondary | ICD-10-CM

## 2019-09-12 DIAGNOSIS — R2689 Other abnormalities of gait and mobility: Secondary | ICD-10-CM | POA: Diagnosis not present

## 2019-09-12 DIAGNOSIS — M25651 Stiffness of right hip, not elsewhere classified: Secondary | ICD-10-CM | POA: Diagnosis not present

## 2019-09-12 DIAGNOSIS — M25652 Stiffness of left hip, not elsewhere classified: Secondary | ICD-10-CM

## 2019-09-12 DIAGNOSIS — E1140 Type 2 diabetes mellitus with diabetic neuropathy, unspecified: Secondary | ICD-10-CM | POA: Diagnosis not present

## 2019-09-12 DIAGNOSIS — M25661 Stiffness of right knee, not elsewhere classified: Secondary | ICD-10-CM

## 2019-09-12 DIAGNOSIS — M21371 Foot drop, right foot: Secondary | ICD-10-CM | POA: Diagnosis not present

## 2019-09-12 DIAGNOSIS — R2681 Unsteadiness on feet: Secondary | ICD-10-CM

## 2019-09-12 DIAGNOSIS — R296 Repeated falls: Secondary | ICD-10-CM | POA: Diagnosis not present

## 2019-09-12 DIAGNOSIS — R293 Abnormal posture: Secondary | ICD-10-CM | POA: Diagnosis not present

## 2019-09-13 ENCOUNTER — Encounter: Payer: Self-pay | Admitting: Family Medicine

## 2019-09-13 DIAGNOSIS — E1140 Type 2 diabetes mellitus with diabetic neuropathy, unspecified: Secondary | ICD-10-CM | POA: Diagnosis not present

## 2019-09-13 NOTE — Therapy (Signed)
[facility] [address] [place], [place], [address] Phone: [phone]   Fax:  [phone]  Physical Therapy Evaluation  Patient Details  Name: [name] MRN: [id number] Date of Birth: Sep 26, 1976 Referring Provider (PT): [name], MD   Encounter Date: 09/12/2019   CLINIC OPERATION CHANGES: [facility] is open at lower capacity following universal masking, social distancing, and patient screening.  The patient's COVID risk of complications score is 4.   PT End of Session - 09/13/19 0610    Visit Number  1    Number of Visits  20    Authorization Type  Medicaid    Equipment Utilized During Treatment  Gait belt    Activity Tolerance  Patient tolerated treatment well;Patient limited by fatigue    Behavior During Therapy  [facility] for tasks assessed/performed       Past Medical History:  Diagnosis Date  . Acute osteomyelitis of ankle or foot, left ([place]) 01/31/2018  . Alveolar hypoventilation   . Anemia    not on iron pill  . Asthma   . Bipolar 2 disorder ([place])   . Carpal tunnel syndrome on right    recurrent  . Cellulitis 08/2010-08/2011  . Chronic pain   . COPD (chronic obstructive pulmonary disease) ([name])    Symbicort daily and Proventil as needed  . Costochondritis   . Depression   . Diabetes mellitus type II, uncontrolled ([place]) 2000   Type 2, Uncontrolled.Takes Lantus daily.Fasting blood sugar runs 150  . Dizziness    occasionally  . Drug-seeking behavior   . GERD (gastroesophageal reflux disease)    takes Pantoprazole and Zantac daily  . Headache    migraine-last one about a yr ago.Topamax daily  . HLD (hyperlipidemia)    takes Atorvastatin daily  . Hypertension    takes Lisinopril and Coreg daily  . Morbid obesity ([place])   . Muscle spasm    takes Flexeril as needed  . Nocturia   . OSA on CPAP   . Peripheral neuropathy    takes Gabapentin daily  . Pneumonia    "walking" several  yrs ago and as a baby (12/05/2018)  . Rectal fissure   . Restless leg   . Syncope 02/25/2016  . Urinary frequency   . Varicose veins    Right medial thigh and Left leg     Past Surgical History:  Procedure Laterality Date  . AMPUTATION Left 02/01/2018   Procedure: LEFT FOURTH AND 5TH TOE RAY AMPUTATION;  Surgeon: [name], MD;  Location: [facility];  Service: Orthopedics;  Laterality: Left;  . AMPUTATION Left 03/03/2018   Procedure: LEFT BELOW KNEE AMPUTATION;  Surgeon: [name], MD;  Location: [facility];  Service: Orthopedics;  Laterality: Left;  . CARPAL TUNNEL RELEASE Bilateral   . CESAREAN SECTION  2007  . INCISION AND DRAINAGE PERIRECTAL ABSCESS Left 05/18/2019   Procedure: IRRIGATION AND DEBRIDEMENT OF PANNIS ABSCESS, POSSIBLE DEBRIDEMENT OF BUTTOCK WOUND;  Surgeon: Donnie Mesa, MD;  Location: [facility];  Service: General;  Laterality: Left;  . IRRIGATION AND DEBRIDEMENT BUTTOCKS Left 05/17/2019   Procedure: IRRIGATION AND DEBRIDEMENT BUTTOCKS;  Surgeon: Donnie Mesa, MD;  Location: [facility];  Service: General;  Laterality: Left;  . KNEE ARTHROSCOPY Right 07/17/2010  . LEFT HEART CATHETERIZATION WITH CORONARY ANGIOGRAM N/A 07/27/2012   Procedure: LEFT HEART CATHETERIZATION WITH CORONARY ANGIOGRAM;  Surgeon: Sherren Mocha, MD;  Location: [facility] CATH LAB;  Service: Cardiovascular;  Laterality: N/A;  . MASS EXCISION  N/A 06/29/2013   Procedure:  WIDE LOCAL EXCISION OF POSTERIOR NECK ABSCESS;  Surgeon: Ralene Ok, MD;  Location: [facility];  Service: General;  Laterality: N/A;  . REPAIR KNEE LIGAMENT Left    "fixed ligaments and chipped patella"  . right transmetatarsal amputation       There were no vitals filed for this visit.   Subjective Assessment - 09/12/19 1236    Subjective  This 43yo female was referred for Physical Therapy evaluation on 08/23/2019 by [name], MD with Left Transtibial Amputation. She underwent Left TTA on 03/03/2018 with I&D sg to heal. She also underwent  right Transmetatarsal Amputation 12/07/2018 with secondary closure 12/26/2018. She had pannis abscess with I&D 05/18/2019. She has been limited in mobility for >70yrs with issues noted & significantly deconditioned.  She has HHPT for 3 visits.    Pertinent History  Rt Transmetatarsal Amputation, LTTA, OA, COPD, asthma, uncontrolled DM2, depression, Neuropathy, Bipolar, carpal tunnel, morbid obesity, syncope    Limitations  Lifting;Standing;Walking;House hold activities    Patient Stated Goals  To walk in house & community    Currently in Pain?  Yes    Pain Score  1    In last week, worst 8/10, best 0/10   Pain Location  Knee    Pain Orientation  Right;Left    Pain Descriptors / Indicators  Aching;Sore    Pain Type  Chronic pain;Other (Comment)   arthritic   Pain Onset  More than a month ago    Pain Frequency  Intermittent    Aggravating Factors   worse at night, may be pain meds wearing off, standing & walking and when first get up    Pain Relieving Factors  pain meds         [facility] PT Assessment - 09/12/19 1230      Assessment   Medical Diagnosis  Deconditioning left transtibial Amputation / right Transmetatarsal amputation    Referring Provider (PT)  [name], MD    Onset Date/Surgical Date  08/23/19   MD referral to PT   Hand Dominance  Right    Prior Therapy  3 [facility] in 2020, 1 OPPT eval, 5 visits with last visit 10/12/2018      Precautions   Precautions  Fall      Balance Screen   Has the patient fallen in the past 6 months  Yes    How many times?  2   1st transfer to shower w/o prosthesis & 2nd rolled off bed   Has the patient had a decrease in activity level because of a fear of falling?   Yes    Is the patient reluctant to leave their home because of a fear of falling?   Yes      [place] residence    Living Arrangements  Children;Non-relatives/Friends   friend does not help, 47yo dtr,  has [place] aide   Type of [place]  One level    Home Equipment  Wheelchair - manual;Walker - 2 wheels;Bedside commode;Tub bench;Grab bars - tub/shower;Hospital bed      Prior Function   Level of Independence  Independent;Independent with household mobility without device;Independent with community mobility without device   not able since toe amputations, 2019 RW & prosthesis    Vocation  On disability    Leisure  activities with dtr, read, bowling, movies  Posture/Postural Control   Posture/Postural Control  Postural limitations    Postural Limitations  Rounded Shoulders;Forward head;Increased lumbar lordosis;Flexed trunk;Weight shift right      ROM / Strength   AROM / PROM / Strength  PROM;Strength;AROM      AROM   Overall AROM   Deficits    AROM Assessment Site  Hip    Right Hip Extension  -30   standing with RW   Left Hip Extension  -30   standing with RW   Right Knee Extension  -35   standing with RW     PROM   Overall PROM   Deficits    Overall PROM Comments  right knee -24* supine,    Right Knee Extension  -24   supine     Strength   Overall Strength  Deficits    Overall Strength Comments  trunk appears functionally ~3/5    Strength Assessment Site  Hip;Knee;Ankle    Right Hip Flexion  3-/5    Right Hip Extension  3-/5    Right Hip ABduction  3-/5    Left Hip Flexion  3-/5    Left Hip Extension  3-/5    Left Hip ABduction  3-/5    Right Knee Flexion  3-/5    Right Knee Extension  4/5    Left Knee Flexion  3-/5    Left Knee Extension  4-/5    Right/Left Ankle  Right    Right Ankle Dorsiflexion  3/5    Right Ankle Plantar Flexion  2/5      Transfers   Transfers  Sit to Stand;Stand to Sit    Sit to Stand  4: Min assist;With upper extremity assist;With armrests;From chair/3-in-1;From elevated surface   requires RW to stabilize   Stand to Sit  5: Supervision;With upper extremity assist;With armrests;To chair/3-in-1;To elevated surface;Uncontrolled  descent   from RW for stability     Ambulation/Gait   Ambulation/Gait  Yes    Ambulation/Gait Assistance  3: Mod assist    Ambulation/Gait Assistance Details  excessive UE weight bearing on RW, partial weight bearing on TTA prosthesis    Ambulation Distance (Feet)  25 Feet    Assistive device  Rolling walker;Prosthesis    Gait Pattern  Step-to pattern;Decreased stride length;Decreased dorsiflexion - right;Decreased weight shift to left;Right foot flat;Antalgic;Lateral hip instability;Trunk flexed;Decreased step length - right;Decreased stance time - left;Right flexed knee in stance;Left flexed knee in stance    Ambulation Surface  Indoor;Level    Gait Comments  RHR 96 SpO2 96%  after 25' gait HR 108 SpO2 94%      Balance   Balance Assessed  Yes      Static Standing Balance   Static Standing - Balance Support  Bilateral upper extremity supported;No upper extremity supported    Static Standing - Level of Assistance  3: Mod assist;5: Stand by assistance   ModA no UE support & supervision BUE RW   Static Standing - Comment/# of Minutes  multiple attempts to stand without UE support up to 13seconds,  with BUE support on RW able to maintain upright for 1 minute      Dynamic Standing Balance   Dynamic Standing - Balance Support  Bilateral upper extremity supported;Left upper extremity supported    Dynamic Standing - Level of Assistance  4: Min assist;3: Mod assist   ModA reaching with dominant UE, MinA scanning   Dynamic Standing - Balance Activities  Eyes opn;Head nods;Head turns;Reaching for objects  Dynamic Standing - Comments  scans environment with head movements only with BUE support on RW;  reaches 2" anteriorly with dominant RUE with LUE on RW      Prosthetics Assessment - 09/12/19 1230      Prosthetics   Current prosthetic wear tolerance (days/week)   2-3 x/wk    Current prosthetic wear tolerance (#hours/day)   typical 1-4 hrs, but has worn up to 8hrs                Objective measurements completed on examination: See above findings.              PT Education - 09/12/19 1300    Education Details  wear prosthesis daily, will work on increasing time but initially just try to wear prosthesis daily,  POC & need for attendance to make progress to continue care    Person(s) Educated  Patient    Methods  Explanation;Verbal cues    Comprehension  Verbalized understanding;Need further instruction       PT Short Term Goals - 09/13/19 0617      PT SHORT TERM GOAL #1   Title  Patient verbalizes consistant wear of prosthesis >/= 4 days/ wk for 4 hours. (All STGs target date 3 visits after evaluation)    Baseline  Patient wears prosthesis ~2-3 days/wk mainly when going out but only 1-4 hours & with inconsistant times.    Time  3    Period  Weeks    Status  New    Target Date  10/05/19      PT SHORT TERM GOAL #2   Title  Patient demonstrates understanding of initial HEP. (All STGs target date 3 visits after evaluation)    Baseline  Patient is unknowledgeable in appropriate exercises with multiple medical issues    Time  3    Period  Weeks    Status  New    Target Date  10/05/19      PT SHORT TERM GOAL #3   Title  Standing balance static without UE support with RW close for safety for 15sec on first attempt with supervision. (All STGs target date 3 visits after evaluation)    Baseline  Static standing balance without UE support multiple attempts up to 13 sec with minA    Time  3    Period  Weeks    Status  New    Target Date  10/05/19      PT SHORT TERM GOAL #4   Title  Patient ambulates 91' with RW & prosthesis with minimal assist. (All STGs target date 3 visits after evaluation)    Baseline  Patient ambulates 16' with RW & prosthesis with moderate assistance.    Time  3    Period  Weeks    Status  New    Target Date  10/05/19        PT Long Term Goals - 09/13/19 0611      PT LONG TERM GOAL #1   Title  Patient  verbalizes & demonstrates proper prosthetic care to enable safe use of prosthesis. (All LTGs Target Date 19 visits after evaluation)    Baseline  Patient is dependent in proper prosthetic care & use.    Time  12    Period  Weeks    Status  New    Target Date  12/06/19      PT LONG TERM GOAL #2   Title  Patient tolerates prosthesis wear daily for >90%  of awake hours with skin issues. (All LTGs Target Date 19 visits after evaluation)    Baseline  Patient is wearing prosthesis ~2-3 days/wk for ~1-4 hours but inconsistant times causing pain issues after wear.    Time  12    Period  Weeks    Status  New    Target Date  12/06/19      PT LONG TERM GOAL #3   Title  Patient reports pain increases </= 3 increments with standing & gait activities.    Baseline  Patient reports bilateral knee pain up to 8/10 with prosthesis wear, standing & gait.    Time  12    Period  Weeks    Status  New    Target Date  12/06/19      PT LONG TERM GOAL #4   Title  Standing balance with RW support reaching 10" anteriorly, picking up objects from floor & scanning environment safely modified independent.  (All LTGs Target Date 15 visits after evaluation)    Baseline  Standing balance with RW support with moderate assist reaching 2" anteriorly.    Time  12    Period  Weeks    Status  New    Target Date  12/06/19      PT LONG TERM GOAL #5   Title  Patient ambulates 150' with RW & prosthesis modified independent to enable basic community entry. (All LTGs Target Date 19 visits after evaluation)    Baseline  Patient ambulates 62' with RW & prosthesis with moderate assistance.    Time  12    Period  Weeks    Status  New    Target Date  12/06/19      PT LONG TERM GOAL #6   Title  Patient negotiates stairs with 2 rails, ramp & curb with RW & prosthesis modified independent to enable community access.  (All LTGs Target Date 19 visits after evaluation)    Baseline  Patient is non-ambulatory on stairs, ramps & curbs  limiting community mobility.     Time  12    Period  Weeks    Status  New    Target Date  12/06/19             Plan - 09/13/19 0802    Clinical Impression Statement  This 43yo female had a left Transtibial Ampuation on 03/03/2018 with 2 revision surgeries in May 2019. She recieved her first prosthesis 08/01/2018 and recieved only 5 treatment sessions before developing issues with left foot. She underwent a left great toe amputation then Transmetatarsal Amputation on 12/07/2018 with closure 12/26/2018. She returned for more PT for evaluation only due to developed wound on ischuim. She has been w/c or bed bound for over 2 years which has led to severe deconditioning. Patient has flucuating wear of prosthesis with ~2-3 days/wk at best for 1-4hrs mainly when she needs to leave the house. She spends large amounts of time in bed. She has right knee pain at evaluation but chronic arthritic pain in multiple joints up to 8/10. Her balance is impaired with RW support required, scans with head movements only and moderate assist to reach only 2". Her gait requires moderate assist with excessive weight bearing on RW, partial weight on prosthesis and gait deviations indicating high fall risk. Patient would benefit from skilled PT intervention to improve mobility & safety.    Personal Factors and Comorbidities  Comorbidity 3+;Fitness;Past/Current Experience;Finances;Time since onset of injury/illness/exacerbation    Comorbidities  left  Transtibial amputation, right Transmetatarsal amputation, OA, asthma, DM, depression, neuropathy, Bipolar    Examination-Activity Limitations  Bed Mobility;Caring for Others;Carry;Lift;Locomotion Level;Stairs;Stand;Transfers    Examination-Participation Restrictions  Community Activity;Driving;Meal Prep;Other   parenting 43yo dtr   Stability/Clinical Decision Making  Evolving/Moderate complexity    Rehab Potential  Good    PT Frequency  2x / week   1x/wk for 3 weeks, then 2x/wk  for 6 weeks   PT Duration  Other (comment)   9 weeks   PT Treatment/Interventions  ADLs/Self Care Home Management;DME Instruction;Gait training;Stair training;Functional mobility training;Therapeutic activities;Therapeutic exercise;Balance training;Neuromuscular re-education;Patient/family education;Prosthetic Training;Orthotic Fit/Training;Vestibular    PT Next Visit Plan  check residual limb & prosthetic care / wear, HEP for balance at sink, prosthetic gait with RW    Consulted and Agree with Plan of Care  Patient       Patient will benefit from skilled therapeutic intervention in order to improve the following deficits and impairments:  Abnormal gait, Decreased activity tolerance, Decreased balance, Decreased endurance, Decreased knowledge of use of DME, Decreased mobility, Decreased range of motion, Decreased strength, Dizziness, Impaired flexibility, Postural dysfunction, Prosthetic Dependency, Obesity, Pain  Visit Diagnosis: Other abnormalities of gait and mobility  Muscle weakness (generalized)  Abnormal posture  Unsteadiness on feet  Foot drop, right  Stiffness of left hip, not elsewhere classified  Stiffness of right hip, not elsewhere classified  Repeated falls  Stiffness of right knee, not elsewhere classified     Problem List Patient Active Problem List   Diagnosis Date Noted  . UTI (urinary tract infection) 06/28/2019  . Right hand pain 06/23/2019  . Amputation above knee ([place]) 06/23/2019  . Left shoulder pain 06/23/2019  . Dysuria 06/14/2019  . Abdominal pain   . Fever   . Abscess of skin of abdomen   . Abscess of buttock, right 05/17/2019  . Hyperglycemia   . Severe protein-calorie malnutrition ([place])   . Suspected COVID-19 virus infection 05/11/2019  . Amputated toe, right ([place]) 04/11/2019  . 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[place]) 04/11/2019  . Hypertriglyceridemia 04/11/2019  . Type 2 diabetes mellitus with diabetic polyneuropathy, with long-term current use  of insulin ([place]) 04/11/2019  . Type 2 diabetes mellitus with hyperglycemia, with long-term current use of insulin ([place]) 04/11/2019  . Infection of right foot   . Cellulitis of great toe of right foot 10/29/2018  . Osteomyelitis of great toe of right foot ([place]) 10/29/2018  . Encounter for orthopedic aftercare following surgical amputation 06/28/2018  . Uncontrolled type 2 diabetes mellitus with insulin therapy ([place]) 04/16/2018  . Allergic rhinitis 03/06/2018  . Unilateral complete BKA, left, sequela ([place])   . Class 3 severe obesity due to excess calories with serious comorbidity and body mass index (BMI) of 50.0 to 59.9 in adult [facility])   . AKI (acute kidney injury) ([place])   . Decreased pedal pulses 06/10/2017  . Unilateral primary osteoarthritis, right knee 10/22/2016  . Primary osteoarthritis of first carpometacarpal joint of left hand 07/30/2016  . Diabetic neuropathy ([place]) 07/14/2016  . Nausea 03/17/2016  . De Quervain's tenosynovitis, bilateral 11/01/2015  . Vitamin D deficiency 09/05/2015  . Recurrent candidiasis of vagina 09/05/2015  . Varicose veins of leg with complications 99991111  . Restless leg syndrome 10/17/2014  . Chronic sinusitis 07/18/2014  . Headache 07/15/2014  . Encounter for chronic pain management 06/30/2013  . HLD (hyperlipidemia) 11/19/2012  . Chest pain 06/27/2012  . Right carpal tunnel syndrome 09/01/2011  . Bilateral knee pain 09/01/2011  . DM (diabetes mellitus)  type II uncontrolled, periph vascular disorder ([place]) 05/22/2008  . Morbid obesity ([place]) 05/22/2008  . 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 Depression 05/22/2008  . Obstructive sleep apnea 05/22/2008  . Hypertension 05/22/2008  . Asthma 05/22/2008  . GERD 05/22/2008    Kassidee Narciso PT, DPT 09/13/2019, 8:13 AM  [facility] [address] [place], [place], [address] Phone: [phone]   Fax:  [phone]  Name: [name] MRN: [id number] Date of Birth: 23-Aug-1976

## 2019-09-14 DIAGNOSIS — E1140 Type 2 diabetes mellitus with diabetic neuropathy, unspecified: Secondary | ICD-10-CM | POA: Diagnosis not present

## 2019-09-15 DIAGNOSIS — E1140 Type 2 diabetes mellitus with diabetic neuropathy, unspecified: Secondary | ICD-10-CM | POA: Diagnosis not present

## 2019-09-16 DIAGNOSIS — E1140 Type 2 diabetes mellitus with diabetic neuropathy, unspecified: Secondary | ICD-10-CM | POA: Diagnosis not present

## 2019-09-17 DIAGNOSIS — E1140 Type 2 diabetes mellitus with diabetic neuropathy, unspecified: Secondary | ICD-10-CM | POA: Diagnosis not present

## 2019-09-18 DIAGNOSIS — E1140 Type 2 diabetes mellitus with diabetic neuropathy, unspecified: Secondary | ICD-10-CM | POA: Diagnosis not present

## 2019-09-19 ENCOUNTER — Ambulatory Visit: Payer: Medicaid Other | Admitting: Physical Therapy

## 2019-09-19 DIAGNOSIS — E1140 Type 2 diabetes mellitus with diabetic neuropathy, unspecified: Secondary | ICD-10-CM | POA: Diagnosis not present

## 2019-09-19 DIAGNOSIS — Z89411 Acquired absence of right great toe: Secondary | ICD-10-CM | POA: Diagnosis not present

## 2019-09-19 DIAGNOSIS — Z89421 Acquired absence of other right toe(s): Secondary | ICD-10-CM | POA: Diagnosis not present

## 2019-09-20 DIAGNOSIS — E1140 Type 2 diabetes mellitus with diabetic neuropathy, unspecified: Secondary | ICD-10-CM | POA: Diagnosis not present

## 2019-09-21 DIAGNOSIS — E1140 Type 2 diabetes mellitus with diabetic neuropathy, unspecified: Secondary | ICD-10-CM | POA: Diagnosis not present

## 2019-09-22 DIAGNOSIS — E1140 Type 2 diabetes mellitus with diabetic neuropathy, unspecified: Secondary | ICD-10-CM | POA: Diagnosis not present

## 2019-09-23 DIAGNOSIS — E1140 Type 2 diabetes mellitus with diabetic neuropathy, unspecified: Secondary | ICD-10-CM | POA: Diagnosis not present

## 2019-09-24 DIAGNOSIS — E1140 Type 2 diabetes mellitus with diabetic neuropathy, unspecified: Secondary | ICD-10-CM | POA: Diagnosis not present

## 2019-09-24 IMAGING — CR DG FOOT COMPLETE 3+V*R*
3 series · 3 of 3 positions shown · non-contrast
Comparison: Foot radiograph 08/02/2016

CLINICAL DATA: Right foot pain. Patient reports pain and redness
but right great toe. No known injury.

EXAM:
RIGHT FOOT COMPLETE - 3+ VIEW

[x foot lat right]
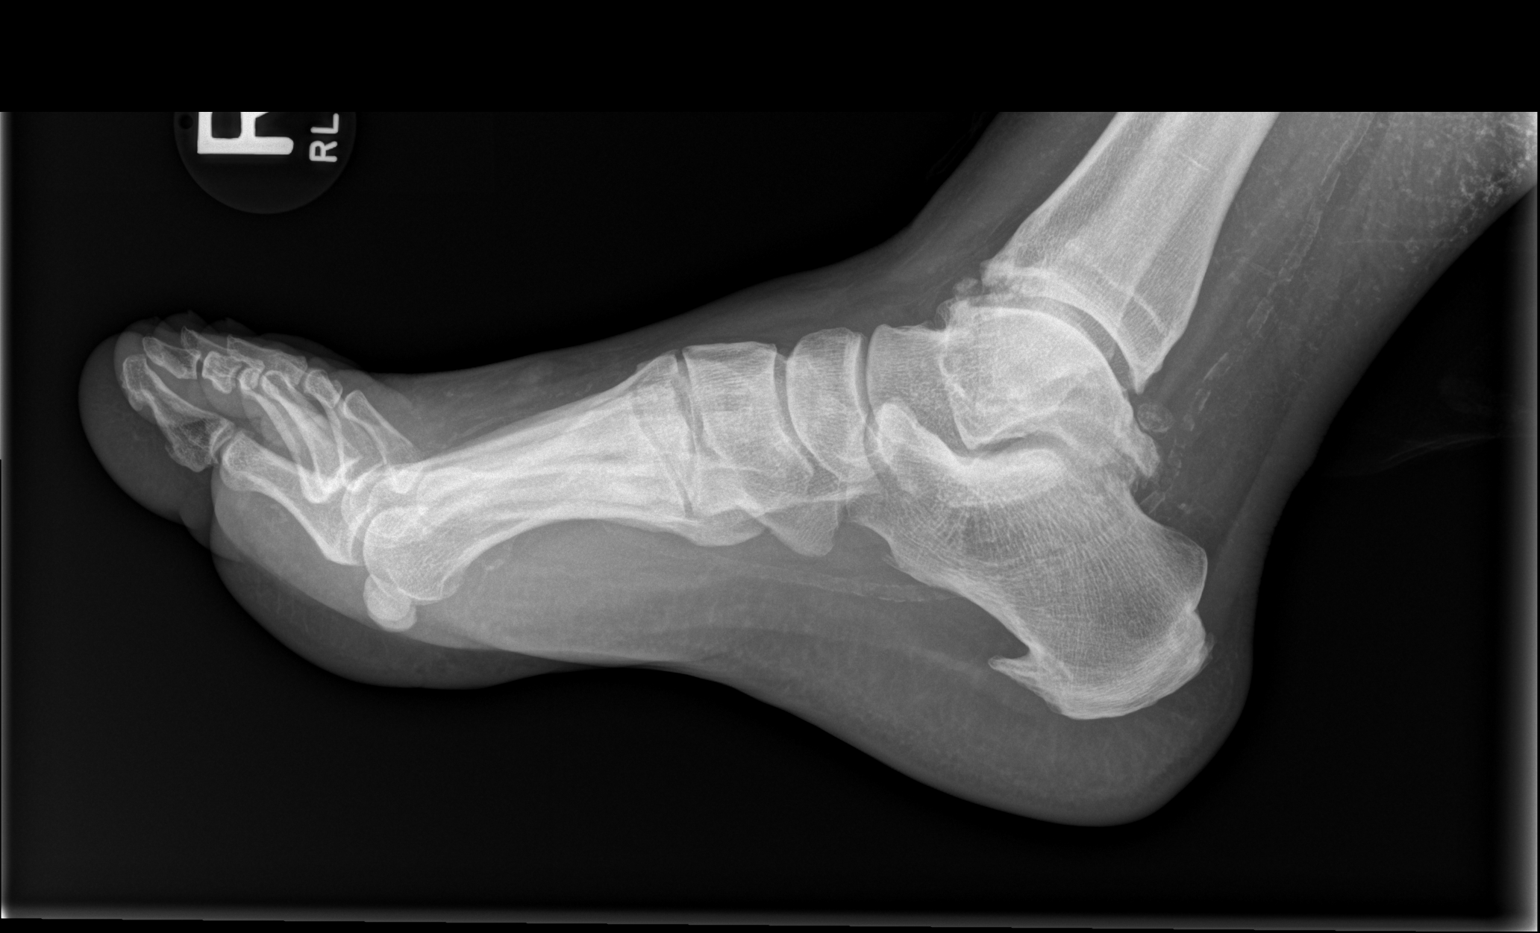

[x foot ap right]
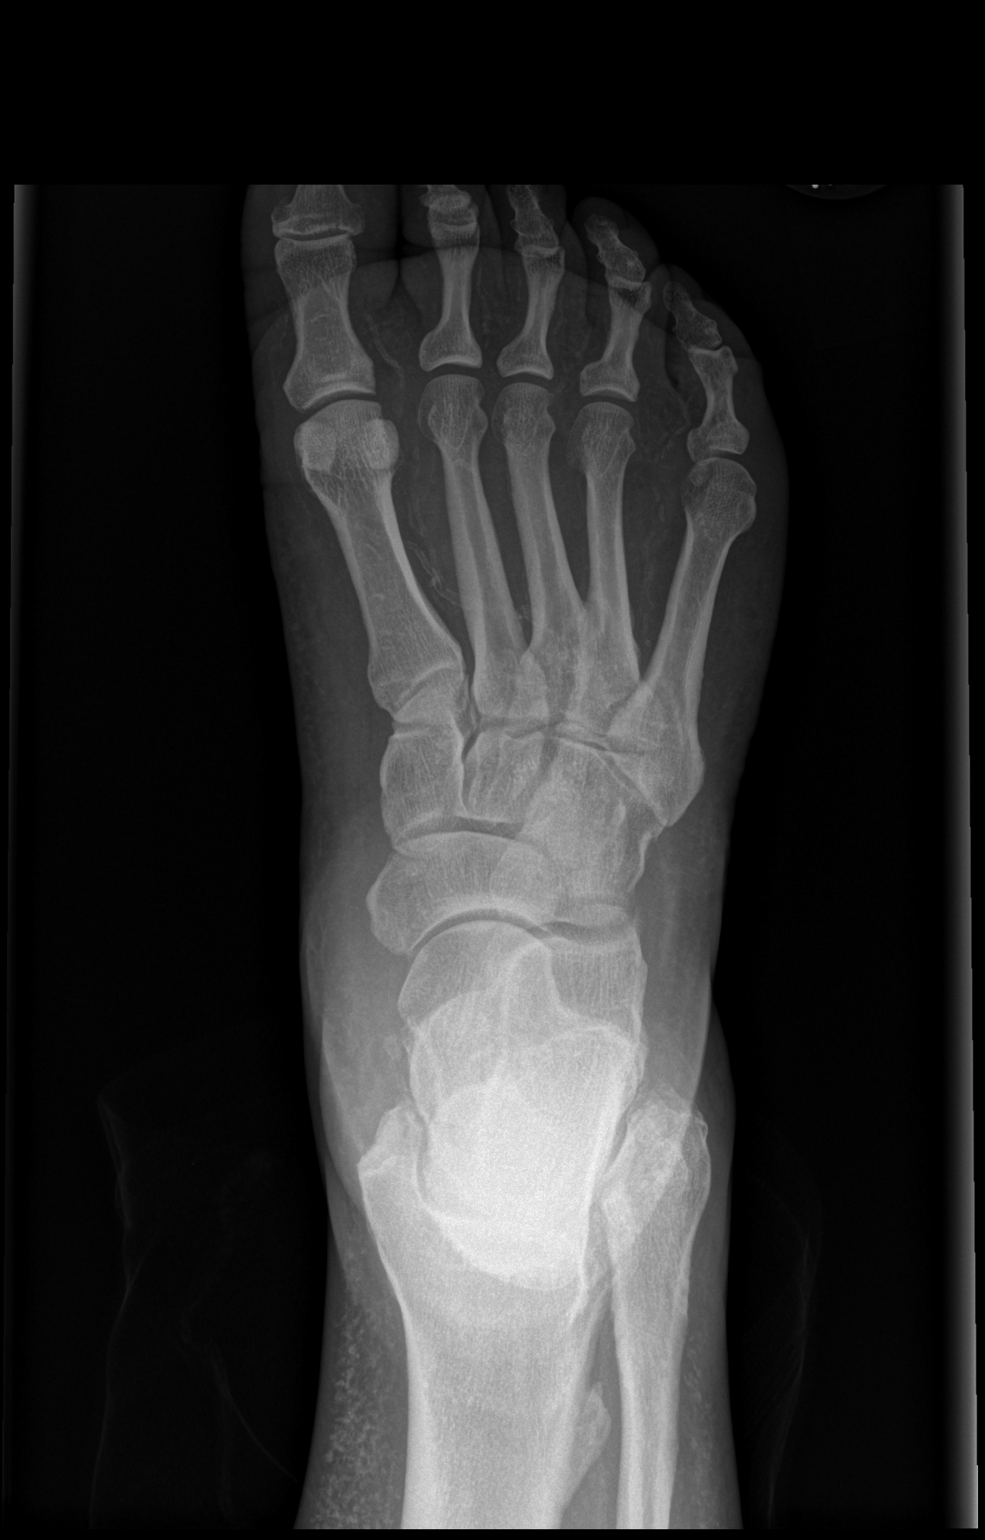

[x foot obl right]
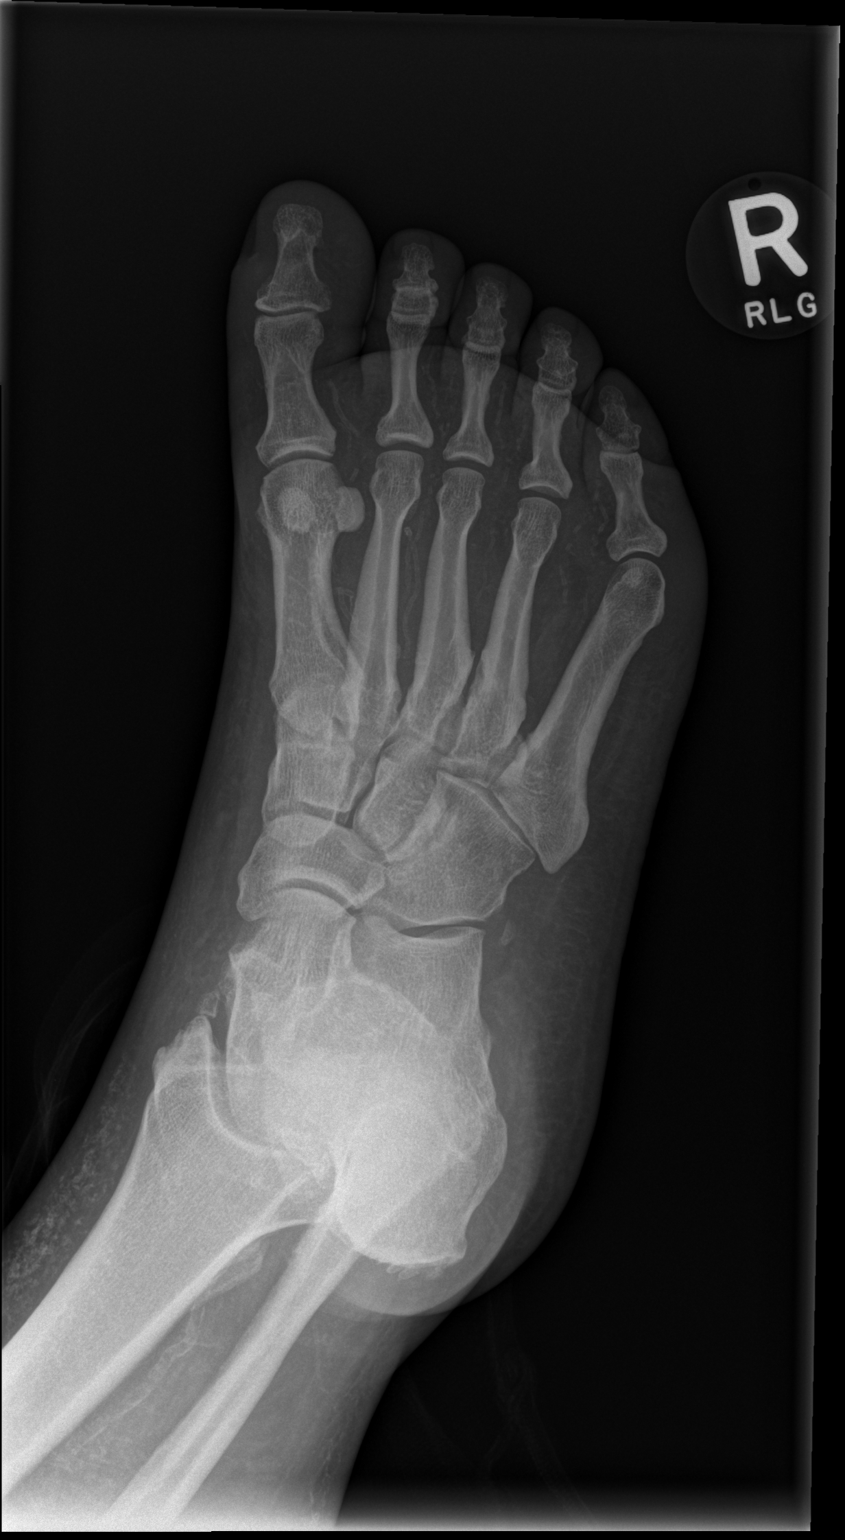

[3 of 3 positions shown; findings below may reference images not displayed]

FINDINGS: There is no evidence of fracture or dislocation. Chronic change of
the anterior tibial talar joint which is likely degenerative. No
periosteal reaction or bony destructive change. Mild hammertoe
deformity of the digits. Plantar calcaneal spur and Achilles tendon
enthesophyte. Vascular calcifications, advanced for age. Dermal
calcifications in the included lower leg, partially included. No
soft tissue air or radiopaque foreign body.
IMPRESSION: 1. No acute findings.
2. Chronic degenerative change at the tibial talar joint, plantar
calcaneal spur and Achilles tendon enthesophyte.

## 2019-09-25 DIAGNOSIS — E1140 Type 2 diabetes mellitus with diabetic neuropathy, unspecified: Secondary | ICD-10-CM | POA: Diagnosis not present

## 2019-09-26 ENCOUNTER — Ambulatory Visit: Payer: Medicaid Other | Admitting: Physical Therapy

## 2019-09-26 DIAGNOSIS — E1140 Type 2 diabetes mellitus with diabetic neuropathy, unspecified: Secondary | ICD-10-CM | POA: Diagnosis not present

## 2019-09-27 DIAGNOSIS — E1140 Type 2 diabetes mellitus with diabetic neuropathy, unspecified: Secondary | ICD-10-CM | POA: Diagnosis not present

## 2019-09-28 ENCOUNTER — Ambulatory Visit: Payer: Medicaid Other | Admitting: Family Medicine

## 2019-09-28 DIAGNOSIS — E1140 Type 2 diabetes mellitus with diabetic neuropathy, unspecified: Secondary | ICD-10-CM | POA: Diagnosis not present

## 2019-09-29 DIAGNOSIS — E1140 Type 2 diabetes mellitus with diabetic neuropathy, unspecified: Secondary | ICD-10-CM | POA: Diagnosis not present

## 2019-09-30 DIAGNOSIS — E1140 Type 2 diabetes mellitus with diabetic neuropathy, unspecified: Secondary | ICD-10-CM | POA: Diagnosis not present

## 2019-10-01 DIAGNOSIS — E1140 Type 2 diabetes mellitus with diabetic neuropathy, unspecified: Secondary | ICD-10-CM | POA: Diagnosis not present

## 2019-10-02 ENCOUNTER — Ambulatory Visit: Payer: Medicaid Other | Admitting: Physical Therapy

## 2019-10-02 DIAGNOSIS — E1140 Type 2 diabetes mellitus with diabetic neuropathy, unspecified: Secondary | ICD-10-CM | POA: Diagnosis not present

## 2019-10-03 DIAGNOSIS — E1140 Type 2 diabetes mellitus with diabetic neuropathy, unspecified: Secondary | ICD-10-CM | POA: Diagnosis not present

## 2019-10-04 DIAGNOSIS — E1140 Type 2 diabetes mellitus with diabetic neuropathy, unspecified: Secondary | ICD-10-CM | POA: Diagnosis not present

## 2019-10-05 ENCOUNTER — Encounter: Payer: Medicaid Other | Admitting: Physical Therapy

## 2019-10-05 DIAGNOSIS — E1140 Type 2 diabetes mellitus with diabetic neuropathy, unspecified: Secondary | ICD-10-CM | POA: Diagnosis not present

## 2019-10-06 DIAGNOSIS — E1140 Type 2 diabetes mellitus with diabetic neuropathy, unspecified: Secondary | ICD-10-CM | POA: Diagnosis not present

## 2019-10-07 DIAGNOSIS — E669 Obesity, unspecified: Secondary | ICD-10-CM | POA: Diagnosis not present

## 2019-10-07 DIAGNOSIS — G4733 Obstructive sleep apnea (adult) (pediatric): Secondary | ICD-10-CM | POA: Diagnosis not present

## 2019-10-07 DIAGNOSIS — E1140 Type 2 diabetes mellitus with diabetic neuropathy, unspecified: Secondary | ICD-10-CM | POA: Diagnosis not present

## 2019-10-07 DIAGNOSIS — M86172 Other acute osteomyelitis, left ankle and foot: Secondary | ICD-10-CM | POA: Diagnosis not present

## 2019-10-07 DIAGNOSIS — J45909 Unspecified asthma, uncomplicated: Secondary | ICD-10-CM | POA: Diagnosis not present

## 2019-10-08 ENCOUNTER — Ambulatory Visit: Payer: Medicaid Other | Attending: Family Medicine | Admitting: Physical Therapy

## 2019-10-08 ENCOUNTER — Encounter: Payer: Self-pay | Admitting: Physical Therapy

## 2019-10-08 ENCOUNTER — Other Ambulatory Visit: Payer: Self-pay

## 2019-10-08 DIAGNOSIS — R2681 Unsteadiness on feet: Secondary | ICD-10-CM | POA: Insufficient documentation

## 2019-10-08 DIAGNOSIS — M25652 Stiffness of left hip, not elsewhere classified: Secondary | ICD-10-CM | POA: Insufficient documentation

## 2019-10-08 DIAGNOSIS — E1140 Type 2 diabetes mellitus with diabetic neuropathy, unspecified: Secondary | ICD-10-CM | POA: Diagnosis not present

## 2019-10-08 DIAGNOSIS — M21371 Foot drop, right foot: Secondary | ICD-10-CM

## 2019-10-08 DIAGNOSIS — R2689 Other abnormalities of gait and mobility: Secondary | ICD-10-CM | POA: Diagnosis not present

## 2019-10-08 DIAGNOSIS — M6281 Muscle weakness (generalized): Secondary | ICD-10-CM | POA: Diagnosis not present

## 2019-10-08 DIAGNOSIS — R293 Abnormal posture: Secondary | ICD-10-CM | POA: Insufficient documentation

## 2019-10-08 NOTE — Patient Instructions (Signed)
Do each exercise 1  times per day Do each exercise 5 repetitions Hold each exercise to feel your location  AT [place].  USE TAPE ON FLOOR TO MARK THE MIDLINE POSITION. You also should try to feel with your limb pressure in socket.  You are trying to feel with limb what you used to feel with the bottom of your foot.  1. Side to Side Shift: Moving your hips only (not shoulders): move weight onto your left leg, HOLD/FEEL.  Move back to equal weight on each leg, HOLD/FEEL. Move weight onto your right leg, HOLD/FEEL. Move back to equal weight on each leg, HOLD/FEEL. Repeat. 2. Front to Back Shift: Moving your hips only (not shoulders): move your weight forward onto your toes, HOLD/FEEL. Move your weight back to equal Flat Foot on both legs, HOLD/FEEL. Move your weight back onto your heels, HOLD/FEEL. Move your weight back to equal on both legs, HOLD/FEEL. Repeat. 3. Stand without holding on:  Intermittently stand without holding on.  4. Moving Cones / Cups: With equal weight on each leg: Hold on with one hand. Move cups from one side of sink to the other. Place cups ~2" out of your reach.

## 2019-10-08 NOTE — Therapy (Signed)
[facility] [address] [place], [place], [address] Phone: [phone]   Fax:  [phone]  Physical Therapy Treatment  Patient Details  Name: [name] MRN: [id number] Date of Birth: [birth year] Referring Provider (PT): [name], MD   Encounter Date: 10/08/2019  PT End of Session - 10/08/19 1013    Visit Number  2    Number of Visits  20    Authorization Type  Medicaid    PT Start Time  0930    PT Stop Time  1010    PT Time Calculation (min)  40 min    Equipment Utilized During Treatment  Gait belt    Activity Tolerance  Patient tolerated treatment well;Patient limited by fatigue    Behavior During Therapy  [facility] for tasks assessed/performed       Past Medical History:  Diagnosis Date  . Acute osteomyelitis of ankle or foot, left ([place]) 01/31/2018  . Alveolar hypoventilation   . Anemia    not on iron pill  . Asthma   . Bipolar 2 disorder ([place])   . Carpal tunnel syndrome on right    recurrent  . Cellulitis 08/2010-08/2011  . Chronic pain   . COPD (chronic obstructive pulmonary disease) ([name])    Symbicort daily and Proventil as needed  . Costochondritis   . Depression   . Diabetes mellitus type II, uncontrolled ([place]) 2000   Type 2, Uncontrolled.Takes Lantus daily.Fasting blood sugar runs 150  . Dizziness    occasionally  . Drug-seeking behavior   . GERD (gastroesophageal reflux disease)    takes Pantoprazole and Zantac daily  . Headache    migraine-last one about a yr ago.Topamax daily  . HLD (hyperlipidemia)    takes Atorvastatin daily  . Hypertension    takes Lisinopril and Coreg daily  . Morbid obesity ([place])   . Muscle spasm    takes Flexeril as needed  . Nocturia   . OSA on CPAP   . Peripheral neuropathy    takes Gabapentin daily  . Pneumonia    "walking" several yrs ago and as a baby (12/05/2018)  . Rectal fissure   . Restless leg   . Syncope 02/25/2016  . Urinary frequency   .  Varicose veins    Right medial thigh and Left leg     Past Surgical History:  Procedure Laterality Date  . AMPUTATION Left 02/01/2018   Procedure: LEFT FOURTH AND 5TH TOE RAY AMPUTATION;  Surgeon: [name], MD;  Location: [facility];  Service: Orthopedics;  Laterality: Left;  . AMPUTATION Left 03/03/2018   Procedure: LEFT BELOW KNEE AMPUTATION;  Surgeon: [name], MD;  Location: [facility];  Service: Orthopedics;  Laterality: Left;  . CARPAL TUNNEL RELEASE Bilateral   . CESAREAN SECTION  2007  . INCISION AND DRAINAGE PERIRECTAL ABSCESS Left 05/18/2019   Procedure: IRRIGATION AND DEBRIDEMENT OF PANNIS ABSCESS, POSSIBLE DEBRIDEMENT OF BUTTOCK WOUND;  Surgeon: Donnie Mesa, MD;  Location: [facility];  Service: General;  Laterality: Left;  . IRRIGATION AND DEBRIDEMENT BUTTOCKS Left 05/17/2019   Procedure: IRRIGATION AND DEBRIDEMENT BUTTOCKS;  Surgeon: Donnie Mesa, MD;  Location: [facility];  Service: General;  Laterality: Left;  . KNEE ARTHROSCOPY Right 07/17/2010  . LEFT HEART CATHETERIZATION WITH CORONARY ANGIOGRAM N/A 07/27/2012   Procedure: LEFT HEART CATHETERIZATION WITH CORONARY ANGIOGRAM;  Surgeon: Sherren Mocha, MD;  Location: [facility] CATH LAB;  Service: Cardiovascular;  Laterality: N/A;  . MASS EXCISION N/A 06/29/2013   Procedure:  WIDE LOCAL EXCISION OF POSTERIOR NECK ABSCESS;  Surgeon: Ralene Ok, MD;  Location: [facility];  Service: General;  Laterality: N/A;  . REPAIR KNEE LIGAMENT Left    "fixed ligaments and chipped patella"  . right transmetatarsal amputation       There were no vitals filed for this visit.  Subjective Assessment - 10/08/19 0930    Subjective  She is wearing prosthesis ~5 days/wk for >50% of awake hours.    Pertinent History  Rt Transmetatarsal Amputation, LTTA, OA, COPD, asthma, uncontrolled DM2, depression, Neuropathy, Bipolar, carpal tunnel, morbid obesity, syncope    Limitations  Lifting;Standing;Walking;House hold activities    Patient Stated Goals  To walk in  house & community    Currently in Pain?  Yes    Pain Score  7     Pain Location  Knee    Pain Orientation  Right;Left    Pain Descriptors / Indicators  Sore;Aching    Pain Type  Chronic pain    Pain Onset  More than a month ago    Pain Frequency  Constant    Aggravating Factors   standing & walking    Pain Relieving Factors  laying down                       [facility] Adult PT Treatment/Exercise - 10/08/19 0930      Transfers   Transfers  Sit to Stand;Stand to Sit    Sit to Stand  5: Supervision;With upper extremity assist;With armrests;From chair/3-in-1;Other (comment)   requires RW to stabilize   Sit to Stand Details  Verbal cues for technique;Tactile cues for weight shifting    Stand to Sit  5: Supervision;With upper extremity assist;With armrests;To chair/3-in-1;Other (comment)   from RW for stability   Stand to Sit Details (indicate cue type and reason)  Verbal cues for technique      Ambulation/Gait   Ambulation/Gait  Yes    Ambulation/Gait Assistance  4: Min assist    Ambulation/Gait Assistance Details  verbal & tactile cues on upright posture, wt shift over prosthesis & step width    Ambulation Distance (Feet)  30 Feet    Assistive device  Rolling walker;Prosthesis    Gait Pattern  Step-to pattern;Decreased stride length;Decreased dorsiflexion - right;Decreased weight shift to left;Right foot flat;Antalgic;Lateral hip instability;Trunk flexed;Decreased step length - right;Decreased stance time - left;Right flexed knee in stance;Left flexed knee in stance    Ambulation Surface  Indoor;Level    Gait Comments  RHR 96 SpO2 96%  after 30' gait HR 106 SpO2 96%      Posture/Postural Control   Posture/Postural Control  --    Postural Limitations  --      Neuro Re-ed    Neuro Re-ed Details   standing exercises at sink for HEP: 1 minute tolerance for 4 reps (1 exercise per rep).  Patient able to stand without UE support on 8seconds 1st attempt & 15 seconds on 2nd  attempt.      Prosthetics   Prosthetic Care Comments   PT issued a foam cover for pin to enable to wear liner when in bed and remove foam to donne prosthesis quicker.  PT recommended eating meals out of bed and discussed using BSC as she cannot get RW into bathroom.  PT recommended wearing prosthesis 4hrs 2x/day.     Current prosthetic wear tolerance (days/week)   ~5 days/wk    Current prosthetic wear tolerance (#hours/day)   reports >4  hrs.     Current prosthetic weight-bearing tolerance (hours/day)   Patient tolerated 3 minutes of gait & 1 minute standing due to limited endurance.  No increase in pain.     Edema  pitting edema    Education Provided  Proper wear schedule/adjustment    Person(s) Educated  Patient;Child(ren)    Education Method  Explanation;Verbal cues    Education Method  Verbalized understanding;Returned demonstration;Tactile cues required;Needs further instruction;Verbal cues required             PT Education - 10/08/19 1010    Education Details  HEP at sink see pt instructions    Person(s) Educated  Patient    Methods  Explanation;Demonstration;Tactile cues;Verbal cues;Handout    Comprehension  Verbalized understanding;Returned demonstration       PT Short Term Goals - 10/08/19 1008      PT SHORT TERM GOAL #1   Title  Patient verbalizes consistant wear of prosthesis >/= 4 days/ wk for 4 hours. (All STGs target date 3 visits after evaluation)    Baseline  MET 10/08/2019 per patient report    Time  3    Period  Weeks    Status  Achieved    Target Date  10/05/19      PT SHORT TERM GOAL #2   Title  Patient demonstrates understanding of initial HEP. (All STGs target date 3 visits after evaluation)    Baseline  Partially MET 10/08/2019  Patient only seen 1x since eval & HEP issued.  She has general understanding.    Time  3    Period  Weeks    Status  Partially Met    Target Date  10/05/19      PT SHORT TERM GOAL #3   Title  Standing balance static without  UE support with RW close for safety for 15sec on first attempt with supervision. (All STGs target date 3 visits after evaluation)    Baseline  Partialy MET  10/08/2019  standing balance without UE support 8 sec 1st attempt & 15sec 2nd attempt    Time  3    Period  Weeks    Status  Partially Met    Target Date  10/05/19      PT SHORT TERM GOAL #4   Title  Patient ambulates 33' with RW & prosthesis with minimal assist. (All STGs target date 3 visits after evaluation)    Baseline  Progressing but not met due to limited visits  10/08/2019  Patient ambulated 39' with RW & prosthesis with minA.    Time  3    Period  Weeks    Status  On-going    Target Date  10/05/19        PT Short Term Goals - 10/08/19 1153      PT SHORT TERM GOAL #1   Title  Patient verbalizes consistant wear of prosthesis >/= 6 days/ wk for 6 hours total/day. (All updated STGs target date 11/07/2019)    Baseline  10/08/2019 per patient report    Time  1    Period  Months    Status  Revised    Target Date  11/07/19      PT SHORT TERM GOAL #2   Title  Patient tolerates 3 minutes of standing activities with UE support with vital signs WNL.    Baseline  Patient tolerates 1 minute of standing activities with UE support with supervision. Vital signs WNL but dyspnea.    Time  1  Period  Months    Status  New    Target Date  11/07/19      PT SHORT TERM GOAL #3   Title  Patient able to reach to floor with RW support with minA.    Baseline  10/08/2019 Patient reaches within 6" of floor with RW support with minA.    Time  1    Period  Months    Status  New    Target Date  11/07/19      PT SHORT TERM GOAL #4   Title  Patient ambulates 23' with RW & prosthesis with supervision.    Baseline  10/08/2019  Patient ambulated 69' with RW & prosthesis with minA.    Time  1    Period  Months    Status  On-going    Target Date  11/07/19        PT Long Term Goals - 09/13/19 0611      PT LONG TERM GOAL #1   Title  Patient  verbalizes & demonstrates proper prosthetic care to enable safe use of prosthesis. (All LTGs Target Date 19 visits after evaluation)    Baseline  Patient is dependent in proper prosthetic care & use.    Time  12    Period  Weeks    Status  New    Target Date  12/06/19      PT LONG TERM GOAL #2   Title  Patient tolerates prosthesis wear daily for >90% of awake hours with skin issues. (All LTGs Target Date 19 visits after evaluation)    Baseline  Patient is wearing prosthesis ~2-3 days/wk for ~1-4 hours but inconsistant times causing pain issues after wear.    Time  12    Period  Weeks    Status  New    Target Date  12/06/19      PT LONG TERM GOAL #3   Title  Patient reports pain increases </= 3 increments with standing & gait activities.    Baseline  Patient reports bilateral knee pain up to 8/10 with prosthesis wear, standing & gait.    Time  12    Period  Weeks    Status  New    Target Date  12/06/19      PT LONG TERM GOAL #4   Title  Standing balance with RW support reaching 10" anteriorly, picking up objects from floor & scanning environment safely modified independent.  (All LTGs Target Date 15 visits after evaluation)    Baseline  Standing balance with RW support with moderate assist reaching 2" anteriorly.    Time  12    Period  Weeks    Status  New    Target Date  12/06/19      PT LONG TERM GOAL #5   Title  Patient ambulates 150' with RW & prosthesis modified independent to enable basic community entry. (All LTGs Target Date 19 visits after evaluation)    Baseline  Patient ambulates 32' with RW & prosthesis with moderate assistance.    Time  12    Period  Weeks    Status  New    Target Date  12/06/19      PT LONG TERM GOAL #6   Title  Patient negotiates stairs with 2 rails, ramp & curb with RW & prosthesis modified independent to enable community access.  (All LTGs Target Date 19 visits after evaluation)    Baseline  Patient is non-ambulatory on stairs, ramps &  curbs  limiting community mobility.     Time  12    Period  Weeks    Status  New    Target Date  12/06/19            Plan - 10/08/19 1150    Clinical Impression Statement  Per patient report she has increased her prosthesis wear & out of bed time.  PT issued HEP today and patient appears to have general understanding.  Patient would benefit from additional skilled PT visits to improve mobility & safety.    Personal Factors and Comorbidities  Comorbidity 3+;Fitness;Past/Current Experience;Finances;Time since onset of injury/illness/exacerbation    Comorbidities  left Transtibial amputation, right Transmetatarsal amputation, OA, asthma, DM, depression, neuropathy, Bipolar    Examination-Activity Limitations  Bed Mobility;Caring for Others;Carry;Lift;Locomotion Level;Stairs;Stand;Transfers    Examination-Participation Restrictions  Community Activity;Driving;Meal Prep;Other   parenting 41yo dtr   Stability/Clinical Decision Making  Evolving/Moderate complexity    Rehab Potential  Good    PT Frequency  2x / week   1x/wk for 3 weeks, then 2x/wk for 6 weeks   PT Duration  Other (comment)   9 weeks   PT Treatment/Interventions  ADLs/Self Care Home Management;DME Instruction;Gait training;Stair training;Functional mobility training;Therapeutic activities;Therapeutic exercise;Balance training;Neuromuscular re-education;Patient/family education;Prosthetic Training;Orthotic Fit/Training;Vestibular    PT Next Visit Plan  check residual limb & prosthetic care / wear, check on HEP for balance at sink, standing balance, prosthetic gait with RW    Consulted and Agree with Plan of Care  Patient       Patient will benefit from skilled therapeutic intervention in order to improve the following deficits and impairments:  Abnormal gait, Decreased activity tolerance, Decreased balance, Decreased endurance, Decreased knowledge of use of DME, Decreased mobility, Decreased range of motion, Decreased strength,  Dizziness, Impaired flexibility, Postural dysfunction, Prosthetic Dependency, Obesity, Pain  Visit Diagnosis: Muscle weakness (generalized)  Other abnormalities of gait and mobility  Abnormal posture  Unsteadiness on feet  Foot drop, right  Stiffness of left hip, not elsewhere classified     Problem List Patient Active Problem List   Diagnosis Date Noted  . UTI (urinary tract infection) 06/28/2019  . Right hand pain 06/23/2019  . Amputation above knee ([place]) 06/23/2019  . Left shoulder pain 06/23/2019  . Dysuria 06/14/2019  . Abdominal pain   . Fever   . Abscess of skin of abdomen   . Abscess of buttock, right 05/17/2019  . Hyperglycemia   . Severe protein-calorie malnutrition ([place])   . Suspected COVID-19 virus infection 05/11/2019  . Amputated toe, right ([place]) 04/11/2019  . 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[place]) 04/11/2019  . Hypertriglyceridemia 04/11/2019  . Type 2 diabetes mellitus with diabetic polyneuropathy, with long-term current use of insulin ([place]) 04/11/2019  . Type 2 diabetes mellitus with hyperglycemia, with long-term current use of insulin ([place]) 04/11/2019  . Infection of right foot   . Cellulitis of great toe of right foot 10/29/2018  . Osteomyelitis of great toe of right foot ([place]) 10/29/2018  . Encounter for orthopedic aftercare following surgical amputation 06/28/2018  . Uncontrolled type 2 diabetes mellitus with insulin therapy ([place]) 04/16/2018  . Allergic rhinitis 03/06/2018  . Unilateral complete BKA, left, sequela ([place])   . Class 3 severe obesity due to excess calories with serious comorbidity and body mass index (BMI) of 50.0 to 59.9 in adult [facility])   . AKI (acute kidney injury) ([place])   . Decreased pedal pulses 06/10/2017  . Unilateral primary osteoarthritis, right knee 10/22/2016  . Primary osteoarthritis  of first carpometacarpal joint of left hand 07/30/2016  . Diabetic neuropathy ([place]) 07/14/2016  . Nausea 03/17/2016  . De Quervain's tenosynovitis,  bilateral 11/01/2015  . Vitamin D deficiency 09/05/2015  . Recurrent candidiasis of vagina 09/05/2015  . Varicose veins of leg with complications 59/40/9050  . Restless leg syndrome 10/17/2014  . Chronic sinusitis 07/18/2014  . Headache 07/15/2014  . Encounter for chronic pain management 06/30/2013  . HLD (hyperlipidemia) 11/19/2012  . Chest pain 06/27/2012  . Right carpal tunnel syndrome 09/01/2011  . Bilateral knee pain 09/01/2011  . DM (diabetes mellitus) type II uncontrolled, periph vascular disorder ([place]) 05/22/2008  . Morbid obesity ([place]) 05/22/2008  . 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 Depression 05/22/2008  . Obstructive sleep apnea 05/22/2008  . Hypertension 05/22/2008  . Asthma 05/22/2008  . GERD 05/22/2008    Jamey Reas, PT, DPT 10/08/2019, 11:53 AM  [facility] [address] [place], [place], [address] Phone: [phone]   Fax:  [phone]  Name: [name] MRN: [id number] Date of Birth: 04-24-1976

## 2019-10-09 ENCOUNTER — Encounter: Payer: Medicaid Other | Admitting: Physical Therapy

## 2019-10-09 DIAGNOSIS — E1140 Type 2 diabetes mellitus with diabetic neuropathy, unspecified: Secondary | ICD-10-CM | POA: Diagnosis not present

## 2019-10-10 ENCOUNTER — Ambulatory Visit: Payer: Medicaid Other | Admitting: Physical Therapy

## 2019-10-10 ENCOUNTER — Other Ambulatory Visit: Payer: Self-pay | Admitting: Family Medicine

## 2019-10-10 DIAGNOSIS — E1140 Type 2 diabetes mellitus with diabetic neuropathy, unspecified: Secondary | ICD-10-CM | POA: Diagnosis not present

## 2019-10-11 ENCOUNTER — Other Ambulatory Visit: Payer: Self-pay

## 2019-10-11 ENCOUNTER — Ambulatory Visit (INDEPENDENT_AMBULATORY_CARE_PROVIDER_SITE_OTHER): Payer: Medicaid Other | Admitting: Family Medicine

## 2019-10-11 ENCOUNTER — Encounter: Payer: Medicaid Other | Admitting: Physical Therapy

## 2019-10-11 ENCOUNTER — Encounter: Payer: Self-pay | Admitting: Family Medicine

## 2019-10-11 ENCOUNTER — Telehealth: Payer: Self-pay | Admitting: *Deleted

## 2019-10-11 VITALS — BP 110/80 | HR 93

## 2019-10-11 DIAGNOSIS — G8929 Other chronic pain: Secondary | ICD-10-CM

## 2019-10-11 DIAGNOSIS — E1140 Type 2 diabetes mellitus with diabetic neuropathy, unspecified: Secondary | ICD-10-CM | POA: Diagnosis not present

## 2019-10-11 DIAGNOSIS — Z89512 Acquired absence of left leg below knee: Secondary | ICD-10-CM

## 2019-10-11 DIAGNOSIS — M25512 Pain in left shoulder: Secondary | ICD-10-CM | POA: Diagnosis not present

## 2019-10-11 DIAGNOSIS — M79641 Pain in right hand: Secondary | ICD-10-CM | POA: Diagnosis not present

## 2019-10-11 MED ORDER — HYDROCODONE-ACETAMINOPHEN 10-325 MG PO TABS: 1.0000 | ORAL_TABLET | Freq: Three times a day (TID) | ORAL | 0 refills | Status: DC | PRN

## 2019-10-11 NOTE — Progress Notes (Signed)
Date of Visit: 10/11/2019   HPI:  Patient presents for routine follow up and medication refill.  Chronic pain - currently taking hydrocodone 10-325mg  three times daily. Has had increasing pain lately as she has begun doing more intensive physical therapy as she works more with her L BKA prosthesis. Has on occasion taken the medication four times a day when pain is particularly bad after physical therapy sessions. Denies excessive sedation from medication. No cravings for it. Storing it safely. No side effects. The medication helps when she takes it. Asking for a higher quantity per month due to pain with physical therapy.   BKA - needs rx for shrinkers and socks handwritten so that she can get new supplies for her prosthesis.  L shoulder pain - having recent onset of pain in L shoulder. Worse with laying on it. Decreased mobility of the shoulder, does not use it much.  Finger pain - has tingling and pain in her fingers. Someone gave her some "arthritis copper gloves" which she has been wearing and has had significant pain relief from these. Continues to not be able to bend one finger on her R hand. Has not yet gone and gotten xrays of her hands. Taking gabapentin 1200mg  three times daily.   ROS: See HPI.  [place]: history of diabetes, obesity, depression, hypertension, asthma, gerd, hyperlipidemia, chronic pain, diabetic neuropathy, OSA  PHYSICAL EXAM: BP 110/80   Pulse 93   SpO2 98%  Gen: no acute distress, pleasant, cooperative, sitting in wheelchair HEENT: normocephalic, atraumatic  Lungs: normal work of breathing  Neuro: speech normal, grossly nonfocal Ext: L BKA in place. Significantly decreased ROM of L shoulder with external rotation, lots of pain with small amount of passive external rotation. Unable to abduct shoulder beyond 90 degrees on L.  ASSESSMENT/PLAN:  Health maintenance:  -advised to schedule appointment with OB/GYN for her pap smear as she is due  Left shoulder  pain Concern for adhesive capsulitis given today's exam. Check xray. Refer to sports medicine for further management. Unfortunately she has very poorly controlled diabetes so I think any steroid injections in her would need to be carefully considered.  Right hand pain Again encouraged to get xrays. As the copper arthritis gloves are helping, reasonable to continue  Encounter for chronic pain management Pain worsened lately due to working with physical therapy with her prosthesis. Reasonable to increase quantity per month by a small amount, so that she can take an extra dose when her pain is significantly worse from physical therapy. Will increase quantity from 90 tabs/mo to 100 tabs/mo. rx sent in to her pharmacy for 2 months worth. She will follow up with me in 2 months. [place] PMP reviewed with appropriate findings.  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[place]) Wrote rx for new shrinkers & socks per patient request.  FOLLOW UP: Follow up in 2 months with me for above issues Referring to sports medicine Patient to schedule with OB/GYN  [name], [place]

## 2019-10-11 NOTE — Assessment & Plan Note (Signed)
Concern for adhesive capsulitis given today's exam. Check xray. Refer to sports medicine for further management. Unfortunately she has very poorly controlled diabetes so I think any steroid injections in her would need to be carefully considered.

## 2019-10-11 NOTE — Assessment & Plan Note (Signed)
Pain worsened lately due to working with physical therapy with her prosthesis. Reasonable to increase quantity per month by a small amount, so that she can take an extra dose when her pain is significantly worse from physical therapy. Will increase quantity from 90 tabs/mo to 100 tabs/mo. rx sent in to her pharmacy for 2 months worth. She will follow up with me in 2 months. [place] PMP reviewed with appropriate findings.

## 2019-10-11 NOTE — Assessment & Plan Note (Signed)
Wrote rx for new shrinkers & socks per patient request.

## 2019-10-11 NOTE — Patient Instructions (Signed)
Referring to sports medicine Go get xrays of hands and left shoulder  Increasing quantity of pain medication to 100 tablets per month. Sent in 2 months worth Follow up with me in 2 months   Wrote prescription for shrinkers & socks  Schedule with your OB/GYN for your pap smear  Be well, Dr. [name]

## 2019-10-11 NOTE — Assessment & Plan Note (Signed)
Again encouraged to get xrays. As the copper arthritis gloves are helping, reasonable to continue

## 2019-10-11 NOTE — Telephone Encounter (Signed)
Completed PA info in [facility] Tr[facility] for hydrocodone (100 tablets per month).  Status pending.  Will recheck status in 24 hours. Christen Bame, CMA

## 2019-10-12 ENCOUNTER — Encounter (HOSPITAL_BASED_OUTPATIENT_CLINIC_OR_DEPARTMENT_OTHER): Payer: Medicaid Other | Admitting: Internal Medicine

## 2019-10-12 DIAGNOSIS — E1140 Type 2 diabetes mellitus with diabetic neuropathy, unspecified: Secondary | ICD-10-CM | POA: Diagnosis not present

## 2019-10-12 NOTE — Telephone Encounter (Signed)
Med approved for 10/11/2019 - 05/04/202.  [facility] and pt informed.  Christen Bame, CMA

## 2019-10-13 DIAGNOSIS — E1140 Type 2 diabetes mellitus with diabetic neuropathy, unspecified: Secondary | ICD-10-CM | POA: Diagnosis not present

## 2019-10-14 DIAGNOSIS — E1140 Type 2 diabetes mellitus with diabetic neuropathy, unspecified: Secondary | ICD-10-CM | POA: Diagnosis not present

## 2019-10-15 DIAGNOSIS — E1140 Type 2 diabetes mellitus with diabetic neuropathy, unspecified: Secondary | ICD-10-CM | POA: Diagnosis not present

## 2019-10-16 ENCOUNTER — Encounter: Payer: Medicaid Other | Admitting: Physical Therapy

## 2019-10-16 DIAGNOSIS — E1140 Type 2 diabetes mellitus with diabetic neuropathy, unspecified: Secondary | ICD-10-CM | POA: Diagnosis not present

## 2019-10-17 ENCOUNTER — Encounter: Payer: Medicaid Other | Admitting: Physical Therapy

## 2019-10-17 DIAGNOSIS — E1140 Type 2 diabetes mellitus with diabetic neuropathy, unspecified: Secondary | ICD-10-CM | POA: Diagnosis not present

## 2019-10-18 ENCOUNTER — Ambulatory Visit: Payer: Medicaid Other | Admitting: Pediatrics

## 2019-10-18 ENCOUNTER — Encounter: Payer: Medicaid Other | Admitting: Physical Therapy

## 2019-10-18 DIAGNOSIS — E1140 Type 2 diabetes mellitus with diabetic neuropathy, unspecified: Secondary | ICD-10-CM | POA: Diagnosis not present

## 2019-10-19 DIAGNOSIS — E1140 Type 2 diabetes mellitus with diabetic neuropathy, unspecified: Secondary | ICD-10-CM | POA: Diagnosis not present

## 2019-10-20 DIAGNOSIS — E1140 Type 2 diabetes mellitus with diabetic neuropathy, unspecified: Secondary | ICD-10-CM | POA: Diagnosis not present

## 2019-10-21 DIAGNOSIS — E1140 Type 2 diabetes mellitus with diabetic neuropathy, unspecified: Secondary | ICD-10-CM | POA: Diagnosis not present

## 2019-10-22 ENCOUNTER — Encounter (HOSPITAL_BASED_OUTPATIENT_CLINIC_OR_DEPARTMENT_OTHER): Payer: Self-pay

## 2019-10-22 ENCOUNTER — Encounter (HOSPITAL_BASED_OUTPATIENT_CLINIC_OR_DEPARTMENT_OTHER): Payer: Medicaid Other | Attending: Internal Medicine | Admitting: Internal Medicine

## 2019-10-22 DIAGNOSIS — E1140 Type 2 diabetes mellitus with diabetic neuropathy, unspecified: Secondary | ICD-10-CM | POA: Diagnosis not present

## 2019-10-23 ENCOUNTER — Encounter: Payer: Medicaid Other | Admitting: Physical Therapy

## 2019-10-23 DIAGNOSIS — E1140 Type 2 diabetes mellitus with diabetic neuropathy, unspecified: Secondary | ICD-10-CM | POA: Diagnosis not present

## 2019-10-24 ENCOUNTER — Encounter: Payer: Medicaid Other | Admitting: Physical Therapy

## 2019-10-24 DIAGNOSIS — E1140 Type 2 diabetes mellitus with diabetic neuropathy, unspecified: Secondary | ICD-10-CM | POA: Diagnosis not present

## 2019-10-25 ENCOUNTER — Encounter: Payer: Medicaid Other | Admitting: Physical Therapy

## 2019-10-25 DIAGNOSIS — E1140 Type 2 diabetes mellitus with diabetic neuropathy, unspecified: Secondary | ICD-10-CM | POA: Diagnosis not present

## 2019-10-26 ENCOUNTER — Ambulatory Visit: Payer: Medicaid Other | Admitting: Physical Therapy

## 2019-10-26 DIAGNOSIS — E1140 Type 2 diabetes mellitus with diabetic neuropathy, unspecified: Secondary | ICD-10-CM | POA: Diagnosis not present

## 2019-10-27 DIAGNOSIS — E1140 Type 2 diabetes mellitus with diabetic neuropathy, unspecified: Secondary | ICD-10-CM | POA: Diagnosis not present

## 2019-10-28 DIAGNOSIS — E1140 Type 2 diabetes mellitus with diabetic neuropathy, unspecified: Secondary | ICD-10-CM | POA: Diagnosis not present

## 2019-10-29 ENCOUNTER — Encounter: Payer: Self-pay | Admitting: Physical Therapy

## 2019-10-29 ENCOUNTER — Ambulatory Visit: Payer: Medicaid Other | Admitting: Physical Therapy

## 2019-10-29 ENCOUNTER — Other Ambulatory Visit: Payer: Self-pay

## 2019-10-29 DIAGNOSIS — R2681 Unsteadiness on feet: Secondary | ICD-10-CM | POA: Diagnosis not present

## 2019-10-29 DIAGNOSIS — R2689 Other abnormalities of gait and mobility: Secondary | ICD-10-CM

## 2019-10-29 DIAGNOSIS — M21371 Foot drop, right foot: Secondary | ICD-10-CM | POA: Diagnosis not present

## 2019-10-29 DIAGNOSIS — R293 Abnormal posture: Secondary | ICD-10-CM | POA: Diagnosis not present

## 2019-10-29 DIAGNOSIS — M25652 Stiffness of left hip, not elsewhere classified: Secondary | ICD-10-CM | POA: Diagnosis not present

## 2019-10-29 DIAGNOSIS — M6281 Muscle weakness (generalized): Secondary | ICD-10-CM

## 2019-10-29 DIAGNOSIS — E1140 Type 2 diabetes mellitus with diabetic neuropathy, unspecified: Secondary | ICD-10-CM | POA: Diagnosis not present

## 2019-10-30 ENCOUNTER — Encounter: Payer: Medicaid Other | Admitting: Physical Therapy

## 2019-10-30 DIAGNOSIS — E1140 Type 2 diabetes mellitus with diabetic neuropathy, unspecified: Secondary | ICD-10-CM | POA: Diagnosis not present

## 2019-10-30 NOTE — Therapy (Signed)
[facility] [address] [place], [place], [address] Phone: [phone]   Fax:  [phone]  Physical Therapy Treatment  Patient Details  Name: [name] MRN: [id number] Date of Birth: 1976-11-27 Referring Provider (PT): [name], MD   Encounter Date: 10/29/2019  PT End of Session - 10/29/19 1359    Visit Number  3    Number of Visits  20    Authorization Type  Medicaid    Authorization Time Period  12 visits 11/20-12/31    Authorization - Visit Number  1    Authorization - Number of Visits  12    PT Start Time  [id number]    PT Stop Time  1358    PT Time Calculation (min)  43 min    Equipment Utilized During Treatment  Gait belt    Activity Tolerance  Patient tolerated treatment well;Patient limited by fatigue    Behavior During Therapy  WFL for tasks assessed/performed       Past Medical History:  Diagnosis Date  . Acute osteomyelitis of ankle or foot, left ([place]) 01/31/2018  . Alveolar hypoventilation   . Anemia    not on iron pill  . Asthma   . Bipolar 2 disorder ([place])   . Carpal tunnel syndrome on right    recurrent  . Cellulitis 08/2010-08/2011  . Chronic pain   . COPD (chronic obstructive pulmonary disease) ([name])    Symbicort daily and Proventil as needed  . Costochondritis   . Depression   . Diabetes mellitus type II, uncontrolled ([place]) 2000   Type 2, Uncontrolled.Takes Lantus daily.Fasting blood sugar runs 150  . Dizziness    occasionally  . Drug-seeking behavior   . GERD (gastroesophageal reflux disease)    takes Pantoprazole and Zantac daily  . Headache    migraine-last one about a yr ago.Topamax daily  . HLD (hyperlipidemia)    takes Atorvastatin daily  . Hypertension    takes Lisinopril and Coreg daily  . Morbid obesity ([place])   . Muscle spasm    takes Flexeril as needed  . Nocturia   . OSA on CPAP   . Peripheral neuropathy    takes Gabapentin daily  . Pneumonia    "walking"  several yrs ago and as a baby (12/05/2018)  . Rectal fissure   . Restless leg   . Syncope 02/25/2016  . Urinary frequency   . Varicose veins    Right medial thigh and Left leg     Past Surgical History:  Procedure Laterality Date  . AMPUTATION Left 02/01/2018   Procedure: LEFT FOURTH AND 5TH TOE RAY AMPUTATION;  Surgeon: [name], MD;  Location: [facility];  Service: Orthopedics;  Laterality: Left;  . AMPUTATION Left 03/03/2018   Procedure: LEFT BELOW KNEE AMPUTATION;  Surgeon: [name], MD;  Location: [facility];  Service: Orthopedics;  Laterality: Left;  . CARPAL TUNNEL RELEASE Bilateral   . CESAREAN SECTION  2007  . INCISION AND DRAINAGE PERIRECTAL ABSCESS Left 05/18/2019   Procedure: IRRIGATION AND DEBRIDEMENT OF PANNIS ABSCESS, POSSIBLE DEBRIDEMENT OF BUTTOCK WOUND;  Surgeon: Donnie Mesa, MD;  Location: [facility];  Service: General;  Laterality: Left;  . IRRIGATION AND DEBRIDEMENT BUTTOCKS Left 05/17/2019   Procedure: IRRIGATION AND DEBRIDEMENT BUTTOCKS;  Surgeon: Donnie Mesa, MD;  Location: [facility];  Service: General;  Laterality: Left;  . KNEE ARTHROSCOPY Right 07/17/2010  . LEFT HEART CATHETERIZATION WITH CORONARY ANGIOGRAM N/A 07/27/2012   Procedure: LEFT HEART CATHETERIZATION  WITH CORONARY ANGIOGRAM;  Surgeon: Sherren Mocha, MD;  Location: [facility] CATH LAB;  Service: Cardiovascular;  Laterality: N/A;  . MASS EXCISION N/A 06/29/2013   Procedure:  WIDE LOCAL EXCISION OF POSTERIOR NECK ABSCESS;  Surgeon: Ralene Ok, MD;  Location: [facility];  Service: General;  Laterality: N/A;  . REPAIR KNEE LIGAMENT Left    "fixed ligaments and chipped patella"  . right transmetatarsal amputation       There were no vitals filed for this visit.  Subjective Assessment - 10/29/19 1315    Subjective  She is out of bed ~4 days/wk and wears prosthesis ~4hrs 2x/day.  She reports both legs swell when up. The tennis shoe diabetic hurt her foot.    Pertinent History  Rt Transmetatarsal Amputation, LTTA, OA,  COPD, asthma, uncontrolled DM2, depression, Neuropathy, Bipolar, carpal tunnel, morbid obesity, syncope    Limitations  Lifting;Standing;Walking;House hold activities    Patient Stated Goals  To walk in house & community    Currently in Pain?  Yes    Pain Score  6     Pain Location  Other (Comment)   all over   Pain Descriptors / Indicators  Aching;Sore    Pain Type  Chronic pain    Pain Onset  More than a month ago    Pain Frequency  Constant    Aggravating Factors   sitting up,    Pain Relieving Factors  medication & getting into bed.                       [facility] Adult PT Treatment/Exercise - 10/29/19 1315      Transfers   Transfers  Sit to Stand;Stand to [employer] Transfers    Sit to Stand  5: Supervision;With upper extremity assist;From chair/3-in-1;Other (comment)   requires RW to stabilize, no armrests on her w/c   Sit to Stand Details  Verbal cues for technique    Stand to Sit  5: Supervision;With upper extremity assist;To chair/3-in-1;Other (comment)   from RW for stability, no armrests on w/c   Stand to Sit Details (indicate cue type and reason)  Verbal cues for technique    Stand Pivot Transfers  5: Supervision   RW & prosthesis chair w/armrests to w/c   Stand Pivot Transfer Details (indicate cue type and reason)  cues on technique for safety      Ambulation/Gait   Ambulation/Gait  Yes    Ambulation/Gait Assistance  4: Min assist    Ambulation/Gait Assistance Details  verbal & tactile cues on upright posture, position w/in RW and wt shift.      Ambulation Distance (Feet)  40 Feet    Assistive device  Rolling walker;Prosthesis    Gait Pattern  Step-to pattern;Decreased stride length;Decreased dorsiflexion - right;Decreased weight shift to left;Right foot flat;Antalgic;Lateral hip instability;Trunk flexed;Decreased step length - right;Decreased stance time - left;Right flexed knee in stance;Left flexed knee in stance    Ambulation Surface  Indoor;Level     Gait Comments  SpO2 97% with all activities, dyspnea       Self-Care   Self-Care  ADL's    ADL's  stepping on/off 2" block to simulate home scales to weigh with RW. Pt return demo understanding.  PT demo technique with scales at MD office.  Pt verbalized understanding.  PT used w/c scale to weigh patient = 309# including prosthesis which probably weighs ~5-6# including the shoe.       Neuro Re-ed  Neuro Re-ed Details   --      Prosthetics   Prosthetic Care Comments   PT recommended out of bed 2hours, in bed 2 hrs with 15 minutes elevation of LEs, rotating thru day for total of 8  hours out of bed; wear prosthesis / liner while in bed     Current prosthetic wear tolerance (days/week)   ~4-5 days/wk    Current prosthetic wear tolerance (#hours/day)   reports >4 hrs 1-2x/day    Current prosthetic weight-bearing tolerance (hours/day)   --    Edema  pitting edema    Residual limb condition   No open areas on residual limb,  normal color, temperture & moisture.  Minimal to no hair growth.     Education Provided  Proper wear schedule/adjustment;Other (comment)   see prosthetic care comments   Person(s) Educated  Patient    Education Method  Explanation;Verbal cues    Education Method  Verbalized understanding;Verbal cues required;Needs further instruction             PT Education - 10/29/19 1330    Education Details  elevation to facilitate fluid getting back to heart/kidneys.    Person(s) Educated  Patient    Methods  Explanation;Demonstration;Verbal cues    Comprehension  Verbalized understanding;Need further instruction       PT Short Term Goals - 10/29/19 1823      PT SHORT TERM GOAL #1   Title  Patient verbalizes consistant wear of prosthesis >/= 6 days/ wk for 6 hours total/day. (All updated STGs target date 11/07/2019)    Baseline  10/08/2019 per patient report    Time  1    Period  Months    Status  On-going    Target Date  11/07/19      PT SHORT TERM GOAL #2    Title  Patient tolerates 3 minutes of standing activities with UE support with vital signs WNL.    Baseline  Patient tolerates 1 minute of standing activities with UE support with supervision. Vital signs WNL but dyspnea.    Time  1    Period  Months    Status  On-going    Target Date  11/07/19      PT SHORT TERM GOAL #3   Title  Patient able to reach to floor with RW support with minA.    Baseline  10/08/2019 Patient reaches within 6" of floor with RW support with minA.    Time  1    Period  Months    Status  On-going    Target Date  11/07/19      PT SHORT TERM GOAL #4   Title  Patient ambulates 23' with RW & prosthesis with supervision.    Baseline  10/08/2019  Patient ambulated 69' with RW & prosthesis with minA.    Time  1    Period  Months    Status  On-going    Target Date  11/07/19        PT Long Term Goals - 10/29/19 1833      PT LONG TERM GOAL #1   Title  Patient verbalizes & demonstrates proper prosthetic care to enable safe use of prosthesis. (All LTGs Target Date 19 visits after evaluation)    Baseline  Patient is dependent in proper prosthetic care & use.    Time  12    Period  Weeks    Status  On-going    Target Date  12/06/19  PT LONG TERM GOAL #2   Title  Patient tolerates prosthesis wear daily for >90% of awake hours with skin issues. (All LTGs Target Date 19 visits after evaluation)    Baseline  Patient is wearing prosthesis ~2-3 days/wk for ~1-4 hours but inconsistant times causing pain issues after wear.    Time  12    Period  Weeks    Status  On-going    Target Date  12/06/19      PT LONG TERM GOAL #3   Title  Patient reports pain increases </= 3 increments with standing & gait activities.    Baseline  Patient reports bilateral knee pain up to 8/10 with prosthesis wear, standing & gait.    Time  12    Period  Weeks    Status  On-going    Target Date  12/06/19      PT LONG TERM GOAL #4   Title  Standing balance with RW support reaching 10"  anteriorly, picking up objects from floor & scanning environment safely modified independent.  (All LTGs Target Date 15 visits after evaluation)    Baseline  Standing balance with RW support with moderate assist reaching 2" anteriorly.    Time  12    Period  Weeks    Status  On-going    Target Date  12/06/19      PT LONG TERM GOAL #5   Title  Patient ambulates 150' with RW & prosthesis modified independent to enable basic community entry. (All LTGs Target Date 19 visits after evaluation)    Baseline  Patient ambulates 38' with RW & prosthesis with moderate assistance.    Time  12    Period  Weeks    Status  On-going    Target Date  12/06/19      PT LONG TERM GOAL #6   Title  Patient negotiates stairs with 2 rails, ramp & curb with RW & prosthesis modified independent to enable community access.  (All LTGs Target Date 19 visits after evaluation)    Baseline  Patient is non-ambulatory on stairs, ramps & curbs limiting community mobility.     Time  12    Period  Weeks    Status  On-going    Target Date  12/06/19            Plan - 10/29/19 1835    Clinical Impression Statement  PT changed out of bed wear time to decrease issue with dependent edema. She appears to understand recommendations for rotating 2hrs up, 2hrs in bed for total of 8 hrs out of bed.  Patient reports weight today is increase of 20# over last few months. She seems to understand PT instructions how to step on/off scales at home or MD office to enable weighing.    Personal Factors and Comorbidities  Comorbidity 3+;Fitness;Past/Current Experience;Finances;Time since onset of injury/illness/exacerbation    Comorbidities  left Transtibial amputation, right Transmetatarsal amputation, OA, asthma, DM, depression, neuropathy, Bipolar    Examination-Activity Limitations  Bed Mobility;Caring for Others;Carry;Lift;Locomotion Level;Stairs;Stand;Transfers    Examination-Participation Restrictions  Community Activity;Driving;Meal  Prep;Other   parenting 41yo dtr   Stability/Clinical Decision Making  Evolving/Moderate complexity    Rehab Potential  Good    PT Frequency  2x / week   1x/wk for 3 weeks, then 2x/wk for 6 weeks   PT Duration  Other (comment)   9 weeks   PT Treatment/Interventions  ADLs/Self Care Home Management;DME Instruction;Gait training;Stair training;Functional mobility training;Therapeutic activities;Therapeutic exercise;Balance training;Neuromuscular re-education;Patient/family  education;Prosthetic Training;Orthotic Fit/Training;Vestibular    PT Next Visit Plan  check residual limb & prosthetic care / wear, check on HEP for balance at sink, standing balance, prosthetic gait with RW    Consulted and Agree with Plan of Care  Patient       Patient will benefit from skilled therapeutic intervention in order to improve the following deficits and impairments:  Abnormal gait, Decreased activity tolerance, Decreased balance, Decreased endurance, Decreased knowledge of use of DME, Decreased mobility, Decreased range of motion, Decreased strength, Dizziness, Impaired flexibility, Postural dysfunction, Prosthetic Dependency, Obesity, Pain  Visit Diagnosis: Other abnormalities of gait and mobility  Unsteadiness on feet  Abnormal posture  Muscle weakness (generalized)     Problem List Patient Active Problem List   Diagnosis Date Noted  . UTI (urinary tract infection) 06/28/2019  . Right hand pain 06/23/2019  . Amputation above knee ([place]) 06/23/2019  . Left shoulder pain 06/23/2019  . Dysuria 06/14/2019  . Abdominal pain   . Fever   . Abscess of skin of abdomen   . Abscess of buttock, right 05/17/2019  . Hyperglycemia   . Severe protein-calorie malnutrition ([place])   . Amputated toe, right ([place]) 04/11/2019  . 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[place]) 04/11/2019  . Hypertriglyceridemia 04/11/2019  . Type 2 diabetes mellitus with diabetic polyneuropathy, with long-term current use of insulin ([place]) 04/11/2019   . Type 2 diabetes mellitus with hyperglycemia, with long-term current use of insulin ([place]) 04/11/2019  . Infection of right foot   . Cellulitis of great toe of right foot 10/29/2018  . Osteomyelitis of great toe of right foot ([place]) 10/29/2018  . Encounter for orthopedic aftercare following surgical amputation 06/28/2018  . Uncontrolled type 2 diabetes mellitus with insulin therapy ([place]) 04/16/2018  . Allergic rhinitis 03/06/2018  . Unilateral complete BKA, left, sequela ([place])   . Class 3 severe obesity due to excess calories with serious comorbidity and body mass index (BMI) of 50.0 to 59.9 in adult [facility])   . AKI (acute kidney injury) ([place])   . Decreased pedal pulses 06/10/2017  . Unilateral primary osteoarthritis, right knee 10/22/2016  . Primary osteoarthritis of first carpometacarpal joint of left hand 07/30/2016  . Diabetic neuropathy ([place]) 07/14/2016  . Nausea 03/17/2016  . De Quervain's tenosynovitis, bilateral 11/01/2015  . Vitamin D deficiency 09/05/2015  . Recurrent candidiasis of vagina 09/05/2015  . Varicose veins of leg with complications [id number]  . Restless leg syndrome 10/17/2014  . Chronic sinusitis 07/18/2014  . Headache 07/15/2014  . Encounter for chronic pain management 06/30/2013  . HLD (hyperlipidemia) 11/19/2012  . Chest pain 06/27/2012  . Right carpal tunnel syndrome 09/01/2011  . Bilateral knee pain 09/01/2011  . DM (diabetes mellitus) type II uncontrolled, periph vascular disorder ([place]) 05/22/2008  . Morbid obesity ([place]) 05/22/2008  . 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 Depression 05/22/2008  . Obstructive sleep apnea 05/22/2008  . Hypertension 05/22/2008  . Asthma 05/22/2008  . GERD 05/22/2008    Jamey Reas PT, DPT 10/30/2019, 8:42 AM  [facility] [address] [place], [place], [address] Phone: [phone]   Fax:  [phone]  Name: [name] MRN: [id number] Date  of Birth: 1976-03-21

## 2019-10-31 DIAGNOSIS — E1140 Type 2 diabetes mellitus with diabetic neuropathy, unspecified: Secondary | ICD-10-CM | POA: Diagnosis not present

## 2019-11-02 DIAGNOSIS — E1140 Type 2 diabetes mellitus with diabetic neuropathy, unspecified: Secondary | ICD-10-CM | POA: Diagnosis not present

## 2019-11-03 DIAGNOSIS — Z89512 Acquired absence of left leg below knee: Secondary | ICD-10-CM | POA: Diagnosis not present

## 2019-11-03 DIAGNOSIS — E1140 Type 2 diabetes mellitus with diabetic neuropathy, unspecified: Secondary | ICD-10-CM | POA: Diagnosis not present

## 2019-11-04 DIAGNOSIS — E1140 Type 2 diabetes mellitus with diabetic neuropathy, unspecified: Secondary | ICD-10-CM | POA: Diagnosis not present

## 2019-11-05 ENCOUNTER — Ambulatory Visit: Payer: Medicaid Other | Admitting: Physical Therapy

## 2019-11-05 DIAGNOSIS — E1140 Type 2 diabetes mellitus with diabetic neuropathy, unspecified: Secondary | ICD-10-CM | POA: Diagnosis not present

## 2019-11-06 ENCOUNTER — Encounter: Payer: Medicaid Other | Admitting: Physical Therapy

## 2019-11-06 DIAGNOSIS — G4733 Obstructive sleep apnea (adult) (pediatric): Secondary | ICD-10-CM | POA: Diagnosis not present

## 2019-11-06 DIAGNOSIS — E669 Obesity, unspecified: Secondary | ICD-10-CM | POA: Diagnosis not present

## 2019-11-06 DIAGNOSIS — J45909 Unspecified asthma, uncomplicated: Secondary | ICD-10-CM | POA: Diagnosis not present

## 2019-11-06 DIAGNOSIS — M86172 Other acute osteomyelitis, left ankle and foot: Secondary | ICD-10-CM | POA: Diagnosis not present

## 2019-11-06 DIAGNOSIS — E1140 Type 2 diabetes mellitus with diabetic neuropathy, unspecified: Secondary | ICD-10-CM | POA: Diagnosis not present

## 2019-11-07 ENCOUNTER — Encounter: Payer: Medicaid Other | Admitting: Physical Therapy

## 2019-11-07 DIAGNOSIS — E1140 Type 2 diabetes mellitus with diabetic neuropathy, unspecified: Secondary | ICD-10-CM | POA: Diagnosis not present

## 2019-11-08 ENCOUNTER — Encounter: Payer: Medicaid Other | Admitting: Physical Therapy

## 2019-11-08 DIAGNOSIS — E1140 Type 2 diabetes mellitus with diabetic neuropathy, unspecified: Secondary | ICD-10-CM | POA: Diagnosis not present

## 2019-11-09 ENCOUNTER — Ambulatory Visit: Payer: Medicaid Other | Admitting: Physical Therapy

## 2019-11-09 DIAGNOSIS — E1140 Type 2 diabetes mellitus with diabetic neuropathy, unspecified: Secondary | ICD-10-CM | POA: Diagnosis not present

## 2019-11-10 DIAGNOSIS — E1140 Type 2 diabetes mellitus with diabetic neuropathy, unspecified: Secondary | ICD-10-CM | POA: Diagnosis not present

## 2019-11-11 DIAGNOSIS — E1140 Type 2 diabetes mellitus with diabetic neuropathy, unspecified: Secondary | ICD-10-CM | POA: Diagnosis not present

## 2019-11-12 ENCOUNTER — Other Ambulatory Visit: Payer: Self-pay

## 2019-11-12 ENCOUNTER — Telehealth: Payer: Self-pay | Admitting: Physical Therapy

## 2019-11-12 ENCOUNTER — Ambulatory Visit: Payer: Medicaid Other | Attending: Family Medicine | Admitting: Physical Therapy

## 2019-11-12 ENCOUNTER — Encounter: Payer: Self-pay | Admitting: Physical Therapy

## 2019-11-12 DIAGNOSIS — R2681 Unsteadiness on feet: Secondary | ICD-10-CM

## 2019-11-12 DIAGNOSIS — M79604 Pain in right leg: Secondary | ICD-10-CM | POA: Insufficient documentation

## 2019-11-12 DIAGNOSIS — E1140 Type 2 diabetes mellitus with diabetic neuropathy, unspecified: Secondary | ICD-10-CM | POA: Diagnosis not present

## 2019-11-12 DIAGNOSIS — M6281 Muscle weakness (generalized): Secondary | ICD-10-CM

## 2019-11-12 DIAGNOSIS — M21371 Foot drop, right foot: Secondary | ICD-10-CM | POA: Diagnosis not present

## 2019-11-12 DIAGNOSIS — M25652 Stiffness of left hip, not elsewhere classified: Secondary | ICD-10-CM

## 2019-11-12 DIAGNOSIS — R2689 Other abnormalities of gait and mobility: Secondary | ICD-10-CM

## 2019-11-12 DIAGNOSIS — M25651 Stiffness of right hip, not elsewhere classified: Secondary | ICD-10-CM | POA: Diagnosis not present

## 2019-11-12 DIAGNOSIS — M25661 Stiffness of right knee, not elsewhere classified: Secondary | ICD-10-CM | POA: Insufficient documentation

## 2019-11-12 DIAGNOSIS — M79605 Pain in left leg: Secondary | ICD-10-CM | POA: Diagnosis not present

## 2019-11-12 DIAGNOSIS — R293 Abnormal posture: Secondary | ICD-10-CM

## 2019-11-12 NOTE — Therapy (Signed)
[facility] [address] [place], [place], [address] Phone: [phone]   Fax:  [phone]  Physical Therapy Treatment  Patient Details  Name: [name] MRN: [id number] Date of Birth: [birth year] Referring Provider (PT): [name], MD   Encounter Date: 11/12/2019  PT End of Session - 11/12/19 1158    Visit Number  4    Number of Visits  20    Authorization Type  Medicaid    Authorization Time Period  12 visits 11/20-12/31    Authorization - Visit Number  2    Authorization - Number of Visits  12    PT Start Time  1100    PT Stop Time  1145    PT Time Calculation (min)  45 min    Equipment Utilized During Treatment  Gait belt    Activity Tolerance  Patient tolerated treatment well;Patient limited by fatigue    Behavior During Therapy  [facility] for tasks assessed/performed       Past Medical History:  Diagnosis Date  . Acute osteomyelitis of ankle or foot, left ([place]) 01/31/2018  . Alveolar hypoventilation   . Anemia    not on iron pill  . Asthma   . Bipolar 2 disorder ([place])   . Carpal tunnel syndrome on right    recurrent  . Cellulitis 08/2010-08/2011  . Chronic pain   . COPD (chronic obstructive pulmonary disease) ([name])    Symbicort daily and Proventil as needed  . Costochondritis   . Depression   . Diabetes mellitus type II, uncontrolled ([place]) 2000   Type 2, Uncontrolled.Takes Lantus daily.Fasting blood sugar runs 150  . Dizziness    occasionally  . Drug-seeking behavior   . GERD (gastroesophageal reflux disease)    takes Pantoprazole and Zantac daily  . Headache    migraine-last one about a yr ago.Topamax daily  . HLD (hyperlipidemia)    takes Atorvastatin daily  . Hypertension    takes Lisinopril and Coreg daily  . Morbid obesity ([place])   . Muscle spasm    takes Flexeril as needed  . Nocturia   . OSA on CPAP   . Peripheral neuropathy    takes Gabapentin daily  . Pneumonia    "walking"  several yrs ago and as a baby (12/05/2018)  . Rectal fissure   . Restless leg   . Syncope 02/25/2016  . Urinary frequency   . Varicose veins    Right medial thigh and Left leg     Past Surgical History:  Procedure Laterality Date  . AMPUTATION Left 02/01/2018   Procedure: LEFT FOURTH AND 5TH TOE RAY AMPUTATION;  Surgeon: [name], MD;  Location: [facility];  Service: Orthopedics;  Laterality: Left;  . AMPUTATION Left 03/03/2018   Procedure: LEFT BELOW KNEE AMPUTATION;  Surgeon: [name], MD;  Location: [place] [facility];  Service: Orthopedics;  Laterality: Left;  . CARPAL TUNNEL RELEASE Bilateral   . CESAREAN SECTION  2007  . INCISION AND DRAINAGE PERIRECTAL ABSCESS Left 05/18/2019   Procedure: IRRIGATION AND DEBRIDEMENT OF PANNIS ABSCESS, POSSIBLE DEBRIDEMENT OF BUTTOCK WOUND;  Surgeon: Donnie Mesa, MD;  Location: [facility];  Service: General;  Laterality: Left;  . IRRIGATION AND DEBRIDEMENT BUTTOCKS Left 05/17/2019   Procedure: IRRIGATION AND DEBRIDEMENT BUTTOCKS;  Surgeon: Donnie Mesa, MD;  Location: [facility];  Service: General;  Laterality: Left;  . KNEE ARTHROSCOPY Right 07/17/2010  . LEFT HEART CATHETERIZATION WITH CORONARY ANGIOGRAM N/A 07/27/2012   Procedure: LEFT HEART CATHETERIZATION  WITH CORONARY ANGIOGRAM;  Surgeon: Sherren Mocha, MD;  Location: [facility] CATH LAB;  Service: Cardiovascular;  Laterality: N/A;  . MASS EXCISION N/A 06/29/2013   Procedure:  WIDE LOCAL EXCISION OF POSTERIOR NECK ABSCESS;  Surgeon: Ralene Ok, MD;  Location: [facility];  Service: General;  Laterality: N/A;  . REPAIR KNEE LIGAMENT Left    "fixed ligaments and chipped patella"  . right transmetatarsal amputation       There were no vitals filed for this visit.  Subjective Assessment - 11/12/19 1101    Subjective  No falls. She is now getting out of bed daily at least some for 4-6 hours total per day. She is wearing prosthesis when laying in bed during the day.  She is waiting on [employer] to inspect her new place  so she can move and new place is more accessible.    Pertinent History  Rt Transmetatarsal Amputation, LTTA, OA, COPD, asthma, uncontrolled DM2, depression, Neuropathy, Bipolar, carpal tunnel, morbid obesity, syncope    Limitations  Lifting;Standing;Walking;House hold activities    Patient Stated Goals  To walk in house & community    Currently in Pain?  No/denies    Pain Onset  More than a month ago                       [facility] Adult PT Treatment/Exercise - 11/12/19 1100      Transfers   Transfers  Sit to Stand;Stand to [employer]    Sit to Stand  5: Supervision;With upper extremity assist;From chair/3-in-1;Other (comment)   requires RW to stabilize, right armrest only on her w/c   Sit to Stand Details  Verbal cues for technique    Stand to Sit  5: Supervision;With upper extremity assist;To chair/3-in-1;Other (comment)   from RW for stability, right armrest only on w/c   Stand to Sit Details (indicate cue type and reason)  Verbal cues for technique    Stand Pivot Transfers  5: Supervision   RW & prosthesis chair w/armrests to w/c     Ambulation/Gait   Ambulation/Gait  Yes    Ambulation/Gait Assistance  4: Min guard    Ambulation/Gait Assistance Details  PT trialed right AFO (GrAFO dynamic response) with anterior plate.  Patient had improved single leg midstance stability and clearance of RLE with GrAFO.  Verbal & tactile cues on upright posture and step width.     Ambulation Distance (Feet)  30 Feet   30' X 3   Assistive device  Rolling walker;Prosthesis;Other (Comment)   right GrAFO anterior plate dynamic respsonse   Gait Pattern  Step-to pattern;Decreased stride length;Decreased dorsiflexion - right;Decreased weight shift to left;Right foot flat;Antalgic;Lateral hip instability;Trunk flexed;Decreased step length - right;Decreased stance time - left;Right flexed knee in stance;Left flexed knee in stance    Ambulation Surface  Indoor;Level    Gait  Comments  SpO2 97% with all activities, dyspnea       Self-Care   Self-Care  --    ADL's  --      Therapeutic Activites    Therapeutic Activities  Other Therapeutic Activities;ADL's    ADL's  used theraband around waist to simulate elastic waist of pants. With RW support, RLE GrAFO and left prosthesis pulling down & up band with min guard.      Other Therapeutic Activities  RW support, RLE GrAFO & LLE prosthesis:  scans right/left & up/down with BUE support, reaches 7" forward and towards floor within 5" of  floor with min Guard.         Prosthetics   Prosthetic Care Comments   PT instructed in rationale for RLE AFO and after trial patient agrees that it improves her stability.   PT recommended wearing most of awake hours including in bed except not overnight.     Current prosthetic wear tolerance (days/week)   daily    Current prosthetic wear tolerance (#hours/day)   reports 4-6hrs at minimum daily    Edema  pitting edema    Residual limb condition   No open areas on residual limb,  normal color, temperture & moisture.  Minimal to no hair growth.     Education Provided  Proper wear schedule/adjustment;Other (comment)   see prosthetic care comments   Person(s) Educated  Patient    Education Method  Explanation;Demonstration;Verbal cues    Education Method  Verbalized understanding;Returned demonstration;Tactile cues required;Verbal cues required;Needs further instruction               PT Short Term Goals - 10/29/19 1823      PT SHORT TERM GOAL #1   Title  Patient verbalizes consistant wear of prosthesis >/= 6 days/ wk for 6 hours total/day. (All updated STGs target date 11/07/2019)    Baseline  10/08/2019 per patient report    Time  1    Period  Months    Status  On-going    Target Date  11/07/19      PT SHORT TERM GOAL #2   Title  Patient tolerates 3 minutes of standing activities with UE support with vital signs WNL.    Baseline  Patient tolerates 1 minute of standing  activities with UE support with supervision. Vital signs WNL but dyspnea.    Time  1    Period  Months    Status  On-going    Target Date  11/07/19      PT SHORT TERM GOAL #3   Title  Patient able to reach to floor with RW support with minA.    Baseline  10/08/2019 Patient reaches within 6" of floor with RW support with minA.    Time  1    Period  Months    Status  On-going    Target Date  11/07/19      PT SHORT TERM GOAL #4   Title  Patient ambulates 71' with RW & prosthesis with supervision.    Baseline  10/08/2019  Patient ambulated 31' with RW & prosthesis with minA.    Time  1    Period  Months    Status  On-going    Target Date  11/07/19        PT Long Term Goals - 10/29/19 1833      PT LONG TERM GOAL #1   Title  Patient verbalizes & demonstrates proper prosthetic care to enable safe use of prosthesis. (All LTGs Target Date 19 visits after evaluation)    Baseline  Patient is dependent in proper prosthetic care & use.    Time  12    Period  Weeks    Status  On-going    Target Date  12/06/19      PT LONG TERM GOAL #2   Title  Patient tolerates prosthesis wear daily for >90% of awake hours with skin issues. (All LTGs Target Date 19 visits after evaluation)    Baseline  Patient is wearing prosthesis ~2-3 days/wk for ~1-4 hours but inconsistant times causing pain issues after wear.  Time  12    Period  Weeks    Status  On-going    Target Date  12/06/19      PT LONG TERM GOAL #3   Title  Patient reports pain increases </= 3 increments with standing & gait activities.    Baseline  Patient reports bilateral knee pain up to 8/10 with prosthesis wear, standing & gait.    Time  12    Period  Weeks    Status  On-going    Target Date  12/06/19      PT LONG TERM GOAL #4   Title  Standing balance with RW support reaching 10" anteriorly, picking up objects from floor & scanning environment safely modified independent.  (All LTGs Target Date 15 visits after evaluation)     Baseline  Standing balance with RW support with moderate assist reaching 2" anteriorly.    Time  12    Period  Weeks    Status  On-going    Target Date  12/06/19      PT LONG TERM GOAL #5   Title  Patient ambulates 150' with RW & prosthesis modified independent to enable basic community entry. (All LTGs Target Date 19 visits after evaluation)    Baseline  Patient ambulates 67' with RW & prosthesis with moderate assistance.    Time  12    Period  Weeks    Status  On-going    Target Date  12/06/19      PT LONG TERM GOAL #6   Title  Patient negotiates stairs with 2 rails, ramp & curb with RW & prosthesis modified independent to enable community access.  (All LTGs Target Date 19 visits after evaluation)    Baseline  Patient is non-ambulatory on stairs, ramps & curbs limiting community mobility.     Time  12    Period  Weeks    Status  On-going    Target Date  12/06/19            Plan - 11/12/19 1211    Clinical Impression Statement  Using a GrAFO dynamic response AFO on right leg improved her balance & gait.  Patient verbalizes wearing prosthesis more & getting out of bed daily now as PT recommended.  She reports working with [employer] housing and should be moving into her own place with her daughter and will improve her ability to be more mobile as current place is cluttered.    Personal Factors and Comorbidities  Comorbidity 3+;Fitness;Past/Current Experience;Finances;Time since onset of injury/illness/exacerbation    Comorbidities  left Transtibial amputation, right Transmetatarsal amputation, OA, asthma, DM, depression, neuropathy, Bipolar    Examination-Activity Limitations  Bed Mobility;Caring for Others;Carry;Lift;Locomotion Level;Stairs;Stand;Transfers    Examination-Participation Restrictions  Community Activity;Driving;Meal Prep;Other   parenting 41yo dtr   Stability/Clinical Decision Making  Evolving/Moderate complexity    Rehab Potential  Good    PT Frequency  2x / week    1x/wk for 3 weeks, then 2x/wk for 6 weeks   PT Duration  Other (comment)   9 weeks   PT Treatment/Interventions  ADLs/Self Care Home Management;DME Instruction;Gait training;Stair training;Functional mobility training;Therapeutic activities;Therapeutic exercise;Balance training;Neuromuscular re-education;Patient/family education;Prosthetic Training;Orthotic Fit/Training;Vestibular    PT Next Visit Plan  check residual limb & prosthetic care / wear, check on HEP for balance at sink, standing balance, prosthetic gait with RW    Consulted and Agree with Plan of Care  Patient       Patient will benefit from skilled therapeutic intervention  in order to improve the following deficits and impairments:  Abnormal gait, Decreased activity tolerance, Decreased balance, Decreased endurance, Decreased knowledge of use of DME, Decreased mobility, Decreased range of motion, Decreased strength, Dizziness, Impaired flexibility, Postural dysfunction, Prosthetic Dependency, Obesity, Pain  Visit Diagnosis: Other abnormalities of gait and mobility  Unsteadiness on feet  Abnormal posture  Muscle weakness  Foot drop, right  Stiffness of left hip, not elsewhere classified  Stiffness of right hip, not elsewhere classified     Problem List Patient Active Problem List   Diagnosis Date Noted  . UTI (urinary tract infection) 06/28/2019  . Right hand pain 06/23/2019  . Amputation above knee ([place]) 06/23/2019  . Left shoulder pain 06/23/2019  . Dysuria 06/14/2019  . Abdominal pain   . Fever   . Abscess of skin of abdomen   . Abscess of buttock, right 05/17/2019  . Hyperglycemia   . Severe protein-calorie malnutrition ([place])   . Amputated toe, right ([place]) 04/11/2019  . 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[name]) 04/11/2019  . Hypertriglyceridemia 04/11/2019  . Type 2 diabetes mellitus with diabetic polyneuropathy, with long-term current use of insulin ([place]) 04/11/2019  . Type 2 diabetes mellitus with hyperglycemia,  with long-term current use of insulin ([place]) 04/11/2019  . Infection of right foot   . Cellulitis of great toe of right foot 10/29/2018  . Osteomyelitis of great toe of right foot ([place]) 10/29/2018  . Encounter for orthopedic aftercare following surgical amputation 06/28/2018  . Uncontrolled type 2 diabetes mellitus with insulin therapy ([place]) 04/16/2018  . Allergic rhinitis 03/06/2018  . Unilateral complete BKA, left, sequela ([place])   . Class 3 severe obesity due to excess calories with serious comorbidity and body mass index (BMI) of 50.0 to 59.9 in adult [facility])   . AKI (acute kidney injury) ([place])   . Decreased pedal pulses 06/10/2017  . Unilateral primary osteoarthritis, right knee 10/22/2016  . Primary osteoarthritis of first carpometacarpal joint of left hand 07/30/2016  . Diabetic neuropathy ([place]) 07/14/2016  . Nausea 03/17/2016  . De Quervain's tenosynovitis, bilateral 11/01/2015  . Vitamin D deficiency 09/05/2015  . Recurrent candidiasis of vagina 09/05/2015  . Varicose veins of leg with complications [id number]  . Restless leg syndrome 10/17/2014  . Chronic sinusitis 07/18/2014  . Headache 07/15/2014  . Encounter for chronic pain management 06/30/2013  . HLD (hyperlipidemia) 11/19/2012  . Chest pain 06/27/2012  . Right carpal tunnel syndrome 09/01/2011  . Bilateral knee pain 09/01/2011  . DM (diabetes mellitus) type II uncontrolled, periph vascular disorder ([place]) 05/22/2008  . Morbid obesity ([place]) 05/22/2008  . 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 Depression 05/22/2008  . Obstructive sleep apnea 05/22/2008  . Hypertension 05/22/2008  . Asthma 05/22/2008  . GERD 05/22/2008    Jamey Reas PT, DPT 11/12/2019, 12:14 PM  [facility] [address] [place], [place], [address] Phone: [phone]   Fax:  [phone]  Name: Shuntell Ostdiek MRN: [id number] Date of Birth: Oct 05, 1976

## 2019-11-12 NOTE — Telephone Encounter (Signed)
[name] has left below-knee amputation and right Transmetatarsal amputation.  She has balance & gait deficits increasing her fall risk. She needs a right AFO with PT recommendation for Ground Reaction AFO dynamic response version to improve right leg stability.  Can you please right an order for a right AFO and place in Epic or FAX to [facility] attn: Megan at [phone] ? Thank you Jamey Reas, PT, DPT PT Specializing in [place] 11/12/2019 @ 12:19 PM Phone:  [phone]  Fax:  [phone] [facility] [address] [place], [place] [address]

## 2019-11-13 ENCOUNTER — Encounter: Payer: Medicaid Other | Admitting: Physical Therapy

## 2019-11-13 DIAGNOSIS — E1140 Type 2 diabetes mellitus with diabetic neuropathy, unspecified: Secondary | ICD-10-CM | POA: Diagnosis not present

## 2019-11-14 ENCOUNTER — Ambulatory Visit: Payer: Medicaid Other | Admitting: Physical Therapy

## 2019-11-14 DIAGNOSIS — E1140 Type 2 diabetes mellitus with diabetic neuropathy, unspecified: Secondary | ICD-10-CM | POA: Diagnosis not present

## 2019-11-15 ENCOUNTER — Ambulatory Visit: Payer: Medicaid Other | Admitting: Internal Medicine

## 2019-11-15 DIAGNOSIS — E1140 Type 2 diabetes mellitus with diabetic neuropathy, unspecified: Secondary | ICD-10-CM | POA: Diagnosis not present

## 2019-11-15 NOTE — Progress Notes (Deleted)
Name: Shaquilla Kehres  Age/ Sex: 43 y.o., female   MRN/ DOB: [id number], 1976-07-21     PCP: [name], MD   Reason for Endocrinology Evaluation: Type 2 Diabetes Mellitus     Initial Endocrinology Clinic Visit: 05/13/2014    PATIENT IDENTIFIER: Ms. Leslea Vowles is a 43 y.o. 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 The patient has followed with Endocrinology clinic since 05/13/2014 for consultative assistance with management of her diabetes.  DIABETIC HISTORY:  [name] was diagnosed with T2DM in 2001 when she was hospitalized for symptomatic hyperglycemia. She was started insulin therapy in 2007. Her hemoglobin A1c has ranged from 9.6% in 2019, peaking at 14.6% in 2012.  On her initial visit to our clinic her A1c was 14.0% . She was on Metformin, byetta , novolog and Lantus   She is intolerant to metformin She was lost to follow from 2018-2020, upon her presentation back to our clinic in 2020, she was on Novolog Mix 70/30 .  Ozempic was started 08/2019  SUBJECTIVE:   During the last visit (08/15/19): We increased morning Humulin Mix to 70 units and decreased evening dose to 35 units. Started ozempic  Today (11/15/2019): [name] is here for a 3 month follow up visit on diabetes management. She checks her blood sugars 1 times daily, preprandial to breakfast. The patient has not had hypoglycemic episodes since the last clinic visit. Otherwise, the patient has not required any recent emergency interventions for hypoglycemia but has presented to the ED in 06/2019.       ROS: As per HPI and as detailed below: Review of Systems  Constitutional: Negative for fever.  HENT: Negative for congestion and sore throat.   Respiratory: Negative for cough and shortness of breath.   Cardiovascular: Negative for chest pain and palpitations.  Gastrointestinal: Negative for diarrhea and nausea.   Genitourinary: Positive for frequency.  Neurological: Positive for tingling.  Endo/Heme/Allergies: Positive for polydipsia.      HOME DIABETES REGIMEN:  Humulin Mix (70/30) 70 units QAM, 35 units QPM  Statin: yes ACE-I/ARB: no   GLUCOSE LOG:  Date Breakfast  Supper  08/15/2019 120 -  9/8 150 -  9/7 110 -  9/6 120 -     DIABETIC COMPLICATIONS: Microvascular complications:   Neuropathy( S/P Left BKA and Right toes amputations)  Denies: retinopathy, CKD  Last Eye Exam: Completed 04/2018  Macrovascular complications:    Denies: CAD, CVA, PVD   HISTORY:  Past Medical History:  Past Medical History:  Diagnosis Date  . Acute osteomyelitis of ankle or foot, left ([facility]) 01/31/2018  . Alveolar hypoventilation   . Anemia    not on iron pill  . Asthma   . Bipolar 2 disorder ([place])   . Carpal tunnel syndrome on right    recurrent  . Cellulitis 08/2010-08/2011  . Chronic pain   . COPD (chronic obstructive pulmonary disease) ([name])    Symbicort daily and Proventil as needed  . Costochondritis   . Depression   . Diabetes mellitus type II, uncontrolled ([place]) 2000   Type 2, Uncontrolled.Takes Lantus daily.Fasting blood sugar runs 150  . Dizziness    occasionally  . Drug-seeking behavior   . GERD (gastroesophageal reflux disease)    takes Pantoprazole and Zantac daily  . Headache    migraine-last one about a yr ago.Topamax daily  . HLD (hyperlipidemia)  takes Atorvastatin daily  . Hypertension    takes Lisinopril and Coreg daily  . Morbid obesity ([place])   . Muscle spasm    takes Flexeril as needed  . Nocturia   . OSA on CPAP   . Peripheral neuropathy    takes Gabapentin daily  . Pneumonia    "walking" several yrs ago and as a baby (12/05/2018)  . Rectal fissure   . Restless leg   . Syncope 02/25/2016  . Urinary frequency   . Varicose veins    Right medial thigh and Left leg    Past Surgical History:  Past Surgical History:  Procedure Laterality Date  .  AMPUTATION Left 02/01/2018   Procedure: LEFT FOURTH AND 5TH TOE RAY AMPUTATION;  Surgeon: [name], MD;  Location: [facility];  Service: Orthopedics;  Laterality: Left;  . AMPUTATION Left 03/03/2018   Procedure: LEFT BELOW KNEE AMPUTATION;  Surgeon: [name], MD;  Location: [facility];  Service: Orthopedics;  Laterality: Left;  . CARPAL TUNNEL RELEASE Bilateral   . CESAREAN SECTION  2007  . INCISION AND DRAINAGE PERIRECTAL ABSCESS Left 05/18/2019   Procedure: IRRIGATION AND DEBRIDEMENT OF PANNIS ABSCESS, POSSIBLE DEBRIDEMENT OF BUTTOCK WOUND;  Surgeon: Donnie Mesa, MD;  Location: [facility];  Service: General;  Laterality: Left;  . IRRIGATION AND DEBRIDEMENT BUTTOCKS Left 05/17/2019   Procedure: IRRIGATION AND DEBRIDEMENT BUTTOCKS;  Surgeon: Donnie Mesa, MD;  Location: [facility];  Service: General;  Laterality: Left;  . KNEE ARTHROSCOPY Right 07/17/2010  . LEFT HEART CATHETERIZATION WITH CORONARY ANGIOGRAM N/A 07/27/2012   Procedure: LEFT HEART CATHETERIZATION WITH CORONARY ANGIOGRAM;  Surgeon: Sherren Mocha, MD;  Location[facility] C[facility] LAB;  Service: Cardiovascular;  Laterality: N/A;  . MASS EXCISION N/A 06/29/2013   Procedure:  WIDE LOCAL EXCISION OF POSTERIOR NECK ABSCESS;  Surgeon: Ralene Ok, MD;  Location: [facility];  Service: General;  Laterality: N/A;  . REPAIR KNEE LIGAMENT Left    "fixed ligaments and chipped patella"  . right transmetatarsal amputation       Social History:  reports that she quit smoking about 25 years ago. Her smoking use included cigarettes. She has a 0.09 pack-year smoking history. She has never used smokeless tobacco. She reports current drug use. She reports that she does not drink alcohol. Family History:  Family History  Problem Relation Age of Onset  . Diabetes Mother   . Hyperlipidemia Mother   . Depression Mother   . Varicose Veins Mother   . Heart attack Paternal Uncle   . Heart disease Paternal Grandmother   . Heart attack Paternal Grandmother   . Heart  attack Paternal Grandfather   . Heart disease Paternal Grandfather   . Heart attack Father   . Cancer Maternal Grandmother        COLON  . Hypertension Maternal Grandmother   . Hyperlipidemia Maternal Grandmother   . Diabetes Maternal Grandmother   . Other Maternal Grandfather        GUN SHOT     HOME MEDICATIONS: Allergies as of 11/15/2019      Reactions   Kiwi Extract Shortness Of Breath, Swelling   Cefepime    AKI, see records from [place] hospitalization in January 2020   Trental [pentoxifylline] Nausea And Vomiting   Nubain [nalbuphine Hcl] Other (See Comments)   "FEELS LIKE SOMETHING CRAWLING ON ME"      Medication List       Accurate as of November 15, 2019  7:15 AM. If you have  any questions, ask your nurse or doctor.        Accu-Chek Nano SmartView w/Device Kit Use to check blood sugar 3 times daily   Accu-Chek Softclix Lancets lancets Use as instructed to check blood sugar 3 times per day   amLODipine 10 MG tablet Commonly known as: NORVASC Take 1 tablet (10 mg total) by mouth daily.   atorvastatin 20 MG tablet Commonly known as: LIPITOR Take 1 tablet (20 mg total) by mouth daily.   budesonide-formoterol 160-4.5 MCG/ACT inhaler Commonly known as: Symbicort inhale 2 PUFFS into THE lungs 2 TIMES DAILY   Cholecalciferol 25 MCG (1000 UT) tablet Take 1 tablet (1,000 Units total) by mouth daily.   cyclobenzaprine 5 MG tablet Commonly known as: FLEXERIL TAKE 1 TABLET BY MOUTH 3 TIMES DAILY AS NEEDED FOR MUSCLE SPASMS   gabapentin 600 MG tablet Commonly known as: NEURONTIN TAKE 2 TABLETS BY MOUTH 3 TIMES DAILY What changed: See the new instructions.   glucose blood test strip Commonly known as: Accu-Chek Guide USE T0 CHECK BLOOD SUGAR 3 TIMES DAILY   HYDROcodone-acetaminophen 10-325 MG tablet Commonly known as: NORCO Take 1 tablet by mouth every 8 (eight) hours as needed.   HYDROcodone-acetaminophen 10-325 MG tablet Commonly known as: NORCO Take 1  tablet by mouth every 8 (eight) hours as needed.   HYDROcodone-acetaminophen 10-325 MG tablet Commonly known as: NORCO Take 1 tablet by mouth every 8 (eight) hours as needed.   Insulin Isophane & Regular Human (70-30) 100 UNIT/ML PEN Commonly known as: HUMULIN 70/30 MIX Inject 70 Units into the skin daily with breakfast AND 35 Units daily with supper.   Ozempic (0.25 or 0.5 MG/DOSE) 2 MG/1.5ML Sopn Generic drug: Semaglutide(0.25 or 0.5MG/DOS) Inject 0.5 mg into the skin once a week.   pantoprazole 40 MG tablet Commonly known as: PROTONIX Take 1 tablet (40 mg total) by mouth daily.   Proventil HFA 108 (90 Base) MCG/ACT inhaler Generic drug: albuterol Inhale 1-2 puffs into the lungs every 6 (six) hours as needed for wheezing or shortness of breath. What changed: See the new instructions.         DATA REVIEWED:  Lab Results  Component Value Date   HGBA1C 17.5 (H) 05/17/2019   HGBA1C 10.1 (H) 12/06/2018   HGBA1C 9.6 (A) 04/25/2018   Lab Results  Component Value Date   MICROALBUR 17.4 (H) 01/24/2015   [place] Comment 09/19/2018   CREATININE 0.97 06/22/2019    ASSESSMENT / PLAN / RECOMMENDATIONS:   1) Type 2 Diabetes Mellitus, Poorly controlled, With neuropathic complications - Most recent A1c of 17.5 %. Goal A1c < 7.0 %.    - Today was a virtual visit and she provided [place] with "Optimal readings" fasting BG's 120-150 , which were similar to her readings from May, 2020 but her A1c in 05/2019  was > 17.0%.  Also when I looked at serum glucose from her ED visit from July, her BG's were > 400 mg/dL.  - Unfortunately I am limited with these virtual visit as I can't personally look at her meter or download, so will not be making any changes at this time   - She is intolerant to Metformin - We discussed adding a GLP-1 agonist, discussed GI side effects, no prior hx of pancreatitis. No FH of medullary cancer   MEDICATIONS:  Humulin mix (70/30)  70 units with Breakfast and  35  units with Supper   Ozempic 0.25 mg weekly, after 6 weeks may increase to 0.5 mg  weekly if no side effects   EDUCATION / INSTRUCTIONS:  BG monitoring instructions: Patient is instructed to check her blood sugars 2 times a day, fasting and supper time.  Call [facility] if: BG persistently < 70 or > 300. . I reviewed the Rule of 15 for the treatment of hypoglycemia in detail with the patient. Literature supplied.   I discussed the assessment and treatment plan with the patient. The patient was provided an opportunity to ask questions and all were answered. The patient agreed with the plan and demonstrated an understanding of the instructions.   The patient was advised to call back or seek an in-person evaluation if the symptoms worsen or if the condition fails to improve as anticipated.    F/U in 2 months    Signed electronically by: Mack Guise, MD  [facility]  [facility] [address] [place], [place] [address] Phone: [phone] FAX: [phone]   CC: [name], [place] [address] Phone: [phone]  Fax: [phone]  Return to Endocrinology clinic as below: Future Appointments  Date Time Provider [place]  11/15/2019 10:50 AM Devone Bonilla, Melanie Crazier, MD [facility] None  11/19/2019 12:30 PM Isaias Cowman, PT [facility]  11/21/2019 10:15 AM Drema Balzarine, PTA [facility]  11/26/2019 11:00 AM Drema Balzarine, PTA [facility]  11/28/2019 10:15 AM Drema Balzarine, PTA [facility]  12/05/2019 11:00 AM Isaias Cowman, PT [facility]

## 2019-11-16 ENCOUNTER — Encounter: Payer: Medicaid Other | Admitting: Physical Therapy

## 2019-11-16 DIAGNOSIS — E1140 Type 2 diabetes mellitus with diabetic neuropathy, unspecified: Secondary | ICD-10-CM | POA: Diagnosis not present

## 2019-11-16 NOTE — Telephone Encounter (Signed)
Rx written, will [facility] to [facility] [name], MD

## 2019-11-17 DIAGNOSIS — E1140 Type 2 diabetes mellitus with diabetic neuropathy, unspecified: Secondary | ICD-10-CM | POA: Diagnosis not present

## 2019-11-18 DIAGNOSIS — E1140 Type 2 diabetes mellitus with diabetic neuropathy, unspecified: Secondary | ICD-10-CM | POA: Diagnosis not present

## 2019-11-19 ENCOUNTER — Encounter: Payer: Self-pay | Admitting: Physical Therapy

## 2019-11-19 ENCOUNTER — Ambulatory Visit: Payer: Medicaid Other | Admitting: Physical Therapy

## 2019-11-19 DIAGNOSIS — E1140 Type 2 diabetes mellitus with diabetic neuropathy, unspecified: Secondary | ICD-10-CM | POA: Diagnosis not present

## 2019-11-20 ENCOUNTER — Encounter: Payer: Medicaid Other | Admitting: Physical Therapy

## 2019-11-21 ENCOUNTER — Ambulatory Visit: Payer: Medicaid Other | Admitting: Physical Therapy

## 2019-11-22 ENCOUNTER — Encounter: Payer: Medicaid Other | Admitting: Physical Therapy

## 2019-11-22 DIAGNOSIS — E1140 Type 2 diabetes mellitus with diabetic neuropathy, unspecified: Secondary | ICD-10-CM | POA: Diagnosis not present

## 2019-11-23 ENCOUNTER — Encounter: Payer: Medicaid Other | Admitting: Physical Therapy

## 2019-11-23 DIAGNOSIS — E1140 Type 2 diabetes mellitus with diabetic neuropathy, unspecified: Secondary | ICD-10-CM | POA: Diagnosis not present

## 2019-11-24 DIAGNOSIS — E1140 Type 2 diabetes mellitus with diabetic neuropathy, unspecified: Secondary | ICD-10-CM | POA: Diagnosis not present

## 2019-11-25 DIAGNOSIS — E1140 Type 2 diabetes mellitus with diabetic neuropathy, unspecified: Secondary | ICD-10-CM | POA: Diagnosis not present

## 2019-11-26 ENCOUNTER — Ambulatory Visit: Payer: Medicaid Other | Admitting: Physical Therapy

## 2019-11-26 ENCOUNTER — Ambulatory Visit (INDEPENDENT_AMBULATORY_CARE_PROVIDER_SITE_OTHER): Payer: Medicaid Other | Admitting: Family Medicine

## 2019-11-26 ENCOUNTER — Other Ambulatory Visit: Payer: Self-pay

## 2019-11-26 VITALS — BP 127/80 | HR 94

## 2019-11-26 DIAGNOSIS — M545 Low back pain, unspecified: Secondary | ICD-10-CM

## 2019-11-26 DIAGNOSIS — Z79899 Other long term (current) drug therapy: Secondary | ICD-10-CM | POA: Diagnosis not present

## 2019-11-26 DIAGNOSIS — E1140 Type 2 diabetes mellitus with diabetic neuropathy, unspecified: Secondary | ICD-10-CM | POA: Diagnosis not present

## 2019-11-26 DIAGNOSIS — G8929 Other chronic pain: Secondary | ICD-10-CM

## 2019-11-26 LAB — POCT URINALYSIS DIP (MANUAL ENTRY)
Bilirubin, UA: NEGATIVE
Glucose, UA: 500 mg/dL — AB
Ketones, POC UA: NEGATIVE mg/dL
Leukocytes, UA: NEGATIVE
Nitrite, UA: NEGATIVE
Protein Ur, POC: NEGATIVE mg/dL
Spec Grav, UA: 1.01 (ref 1.010–1.025)
Urobilinogen, UA: 0.2 E.U./dL
pH, UA: 5.5 (ref 5.0–8.0)

## 2019-11-26 LAB — POCT UA - MICROSCOPIC ONLY

## 2019-11-26 NOTE — Progress Notes (Signed)
Date of Visit: 11/26/2019   HPI:  [name] presents for follow up.  Back pain - has had for 1-2 weeks. Comes and goes. Located in bilateral lower back muscles. Was worried this was her kidneys. No fevers or dysuria. No trouble peeing. Not getting better or worse.   Chronic pain - currently taking norco 10-325mg  three times daily. On days she has physical therapy will take an extra tablet (twice a week). Tolerating well. Helps her pain. No excessive sedation. Storing the medication safely. Last dose yesterday.  Watery eyes - for several months has had watering of her R eye. Last week develoepd watering of her L eye. Has had clear drainage. Feels like it's a bit harder to see. No pain. Has eye appointment tomorrow to be checked out.  Had a transient episode of chest pain last week on the Rside of her chest that came and went, lasted a few hours. No aggravating/alleviating factors. Was sore to the touch at the time and was able to reproduce the pain with palpating.  ROS: See HPI.  [place]: history of asthma, depression, diabetic neruopathy, chronic pain, type 2 diabetes, hyperlipidemia, prior BKA & TMA, hypertension   PHYSICAL EXAM: BP 127/80   Pulse 94   SpO2 97%  Gen: no acute distress, pleasant, cooeprative HEENT: normocephalic, atraumatic. pupils equal round and reactive to light. No drainage noted from eyes. Extraocular movements intact. Sclera noninjected. Neuro: alert, speech norma, grossly nonfocal Back: bilateral low back mildly tender to palpation in paraspinal muscles  ASSESSMENT/PLAN:  Health maintenance:  -advised to schedule appointment with OB/GYN for pap (offered to do it here today but patient decided to wait)  Encounter for chronic pain management Stable. Reviewed PMP with appropriate findings.  Check UDS today. Continue current therapy, benefits outweigh risks. Will continue at 100 tabs/mo so she has enough to take an extra dose on days she has physical therapy. Follow up  in 3 months.   Back pain Musculoskeletal in etiology based on exam, without red flags. UA without signs of infection. Recommend heating pad, gentle stretching, follow up as needed   Chest wall pain - description of pain consistent with chest wall pain. Self resolved, monitor  Runny eyes - likely allergies, has eye appointment tomorrow so will defer management to eye doctor  FOLLOW UP: Follow up in 3 mos for chronic pain  [name], [place]

## 2019-11-27 ENCOUNTER — Encounter: Payer: Medicaid Other | Admitting: Physical Therapy

## 2019-11-27 DIAGNOSIS — E1140 Type 2 diabetes mellitus with diabetic neuropathy, unspecified: Secondary | ICD-10-CM | POA: Diagnosis not present

## 2019-11-28 ENCOUNTER — Encounter: Payer: Self-pay | Admitting: Physical Therapy

## 2019-11-28 ENCOUNTER — Other Ambulatory Visit: Payer: Self-pay

## 2019-11-28 ENCOUNTER — Ambulatory Visit: Payer: Medicaid Other | Admitting: Physical Therapy

## 2019-11-28 DIAGNOSIS — R2689 Other abnormalities of gait and mobility: Secondary | ICD-10-CM | POA: Diagnosis not present

## 2019-11-28 DIAGNOSIS — E1140 Type 2 diabetes mellitus with diabetic neuropathy, unspecified: Secondary | ICD-10-CM | POA: Diagnosis not present

## 2019-11-28 DIAGNOSIS — R293 Abnormal posture: Secondary | ICD-10-CM | POA: Diagnosis not present

## 2019-11-28 DIAGNOSIS — M25661 Stiffness of right knee, not elsewhere classified: Secondary | ICD-10-CM | POA: Diagnosis not present

## 2019-11-28 DIAGNOSIS — M25651 Stiffness of right hip, not elsewhere classified: Secondary | ICD-10-CM | POA: Diagnosis not present

## 2019-11-28 DIAGNOSIS — R2681 Unsteadiness on feet: Secondary | ICD-10-CM | POA: Diagnosis not present

## 2019-11-28 DIAGNOSIS — M79605 Pain in left leg: Secondary | ICD-10-CM | POA: Diagnosis not present

## 2019-11-28 DIAGNOSIS — M6281 Muscle weakness (generalized): Secondary | ICD-10-CM | POA: Diagnosis not present

## 2019-11-28 DIAGNOSIS — M25652 Stiffness of left hip, not elsewhere classified: Secondary | ICD-10-CM | POA: Diagnosis not present

## 2019-11-28 DIAGNOSIS — M79604 Pain in right leg: Secondary | ICD-10-CM | POA: Diagnosis not present

## 2019-11-28 DIAGNOSIS — M21371 Foot drop, right foot: Secondary | ICD-10-CM | POA: Diagnosis not present

## 2019-11-28 MED ORDER — HYDROCODONE-ACETAMINOPHEN 10-325 MG PO TABS: 1.0000 | ORAL_TABLET | Freq: Three times a day (TID) | ORAL | 0 refills | Status: DC | PRN

## 2019-11-28 NOTE — Assessment & Plan Note (Addendum)
Stable. Reviewed PMP with appropriate findings.  Check UDS today. Continue current therapy, benefits outweigh risks. Will continue at 100 tabs/mo so she has enough to take an extra dose on days she has physical therapy. Follow up in 3 months.

## 2019-11-29 ENCOUNTER — Encounter: Payer: Medicaid Other | Admitting: Physical Therapy

## 2019-11-29 DIAGNOSIS — E1140 Type 2 diabetes mellitus with diabetic neuropathy, unspecified: Secondary | ICD-10-CM | POA: Diagnosis not present

## 2019-11-30 NOTE — Therapy (Signed)
[facility] [address] [place], [place], [address] Phone: [phone]   Fax:  [phone]  Physical Therapy Treatment  Patient Details  Name: [name] MRN: [id number] Date of Birth: 03-17-1976 Referring Provider (PT): [name], MD   Encounter Date: 11/28/2019    11/28/19 1025  PT Visits / Re-Eval  Visit Number 5  Number of Visits 20  Authorization  Authorization Type Medicaid  Authorization Time Period 12 visits 11/20-12/31  Authorization - Visit Number 3  Authorization - Number of Visits 12  PT Time Calculation  PT Start Time 1020  PT Stop Time 1100  PT Time Calculation (min) 40 min  PT - End of Session  Equipment Utilized During Treatment Gait belt  Activity Tolerance Patient tolerated treatment well;Patient limited by fatigue  Behavior During Therapy [facility] for tasks assessed/performed     Past Medical History:  Diagnosis Date  . Acute osteomyelitis of ankle or foot, left ([place]) 01/31/2018  . Alveolar hypoventilation   . Anemia    not on iron pill  . Asthma   . Bipolar 2 disorder ([place])   . Carpal tunnel syndrome on right    recurrent  . Cellulitis 08/2010-08/2011  . Chronic pain   . COPD (chronic obstructive pulmonary disease) ([name])    Symbicort daily and Proventil as needed  . Costochondritis   . Depression   . Diabetes mellitus type II, uncontrolled ([place]) 2000   Type 2, Uncontrolled.Takes Lantus daily.Fasting blood sugar runs 150  . Dizziness    occasionally  . Drug-seeking behavior   . GERD (gastroesophageal reflux disease)    takes Pantoprazole and Zantac daily  . Headache    migraine-last one about a yr ago.Topamax daily  . HLD (hyperlipidemia)    takes Atorvastatin daily  . Hypertension    takes Lisinopril and Coreg daily  . Morbid obesity ([place])   . Muscle spasm    takes Flexeril as needed  . Nocturia   . OSA on CPAP   . Peripheral neuropathy    takes Gabapentin daily  .  Pneumonia    "walking" several yrs ago and as a baby (12/05/2018)  . Rectal fissure   . Restless leg   . Syncope 02/25/2016  . Urinary frequency   . Varicose veins    Right medial thigh and Left leg     Past Surgical History:  Procedure Laterality Date  . AMPUTATION Left 02/01/2018   Procedure: LEFT FOURTH AND 5TH TOE RAY AMPUTATION;  Surgeon: [name], MD;  Location: [facility];  Service: Orthopedics;  Laterality: Left;  . AMPUTATION Left 03/03/2018   Procedure: LEFT BELOW KNEE AMPUTATION;  Surgeon: [name], MD;  Location: [facility];  Service: Orthopedics;  Laterality: Left;  . CARPAL TUNNEL RELEASE Bilateral   . CESAREAN SECTION  2007  . INCISION AND DRAINAGE PERIRECTAL ABSCESS Left 05/18/2019   Procedure: IRRIGATION AND DEBRIDEMENT OF PANNIS ABSCESS, POSSIBLE DEBRIDEMENT OF BUTTOCK WOUND;  Surgeon: Donnie Mesa, MD;  Location: [facility];  Service: General;  Laterality: Left;  . IRRIGATION AND DEBRIDEMENT BUTTOCKS Left 05/17/2019   Procedure: IRRIGATION AND DEBRIDEMENT BUTTOCKS;  Surgeon: Donnie Mesa, MD;  Location: [facility];  Service: General;  Laterality: Left;  . KNEE ARTHROSCOPY Right 07/17/2010  . LEFT HEART CATHETERIZATION WITH CORONARY ANGIOGRAM N/A 07/27/2012   Procedure: LEFT HEART CATHETERIZATION WITH CORONARY ANGIOGRAM;  Surgeon: Sherren Mocha, MD;  Location: [facility] CATH LAB;  Service: Cardiovascular;  Laterality: N/A;  . MASS EXCISION N/A  06/29/2013   Procedure:  WIDE LOCAL EXCISION OF POSTERIOR NECK ABSCESS;  Surgeon: Ralene Ok, MD;  Location: [facility];  Service: General;  Laterality: N/A;  . REPAIR KNEE LIGAMENT Left    "fixed ligaments and chipped patella"  . right transmetatarsal amputation       There were no vitals filed for this visit.     11/28/19 1024  Symptoms/Limitations  Subjective No falls. Wearing prosthesis 4-6 hours a day. Reports pain "all over", mostly in LE's. Picking up her brace from [place] on 11/11/19 with Megan.  Pertinent History Rt Transmetatarsal  Amputation, LTTA, OA, COPD, asthma, uncontrolled DM2, depression, Neuropathy, Bipolar, carpal tunnel, morbid obesity, syncope  Limitations Lifting;Standing;Walking;House hold activities  Patient Stated Goals To walk in house & community  Pain Assessment  Currently in Pain? Yes  Pain Score  (all over)      11/28/19 1026  Transfers  Transfers Sit to Stand;Stand to Sit  Sit to Stand 5: Supervision;With upper extremity assist;From chair/3-in-1;Other (comment)  Sit to Stand Details Verbal cues for technique;Verbal cues for safe use of DME/AE  Stand to Sit 5: Supervision;With upper extremity assist;To chair/3-in-1;Other (comment)  Stand to Sit Details (indicate cue type and reason) Verbal cues for sequencing;Verbal cues for safe use of DME/AE  Ambulation/Gait  Ambulation/Gait Yes  Ambulation/Gait Assistance 4: Min guard  Ambulation/Gait Assistance Details continued with use of anterior GRAFO to right LE with gait. cues for posture, step length and weight shifting with gait.                   Ambulation Distance (Feet) 15 Feet (x1, 40x1, 30 x2 reps)  Assistive device Rolling walker;Prosthesis;Other (Comment)  Gait Pattern Step-to pattern;Decreased stride length;Decreased dorsiflexion - right;Decreased weight shift to left;Right foot flat;Antalgic;Lateral hip instability;Trunk flexed;Decreased step length - right;Decreased stance time - left;Right flexed knee in stance;Left flexed knee in stance  Ambulation Surface Level;Indoor  Prosthetics  Current prosthetic wear tolerance (days/week)  daily  Current prosthetic wear tolerance (#hours/day)  reports 4-6hrs at minimum daily  Residual limb condition  intact per pt report  Education Provided Residual limb care;Proper wear schedule/adjustment;Proper weight-bearing schedule/adjustment  Person(s) Educated Patient;Child(ren)  Education Method Explanation;Demonstration;Verbal cues  Education Method Verbalized understanding;Returned  demonstration;Verbal cues required;Needs further instruction        PT Short Term Goals - 10/29/19 1823      PT SHORT TERM GOAL #1   Title  Patient verbalizes consistant wear of prosthesis >/= 6 days/ wk for 6 hours total/day. (All updated STGs target date 11/07/2019)    Baseline  10/08/2019 per patient report    Time  1    Period  Months    Status  On-going    Target Date  11/07/19      PT SHORT TERM GOAL #2   Title  Patient tolerates 3 minutes of standing activities with UE support with vital signs WNL.    Baseline  Patient tolerates 1 minute of standing activities with UE support with supervision. Vital signs WNL but dyspnea.    Time  1    Period  Months    Status  On-going    Target Date  11/07/19      PT SHORT TERM GOAL #3   Title  Patient able to reach to floor with RW support with minA.    Baseline  10/08/2019 Patient reaches within 6" of floor with RW support with minA.    Time  1    Period  Months  Status  On-going    Target Date  11/07/19      PT SHORT TERM GOAL #4   Title  Patient ambulates 3' with RW & prosthesis with supervision.    Baseline  10/08/2019  Patient ambulated 54' with RW & prosthesis with minA.    Time  1    Period  Months    Status  On-going    Target Date  11/07/19        PT Long Term Goals - 10/29/19 1833      PT LONG TERM GOAL #1   Title  Patient verbalizes & demonstrates proper prosthetic care to enable safe use of prosthesis. (All LTGs Target Date 19 visits after evaluation)    Baseline  Patient is dependent in proper prosthetic care & use.    Time  12    Period  Weeks    Status  On-going    Target Date  12/06/19      PT LONG TERM GOAL #2   Title  Patient tolerates prosthesis wear daily for >90% of awake hours with skin issues. (All LTGs Target Date 19 visits after evaluation)    Baseline  Patient is wearing prosthesis ~2-3 days/wk for ~1-4 hours but inconsistant times causing pain issues after wear.    Time  12    Period  Weeks     Status  On-going    Target Date  12/06/19      PT LONG TERM GOAL #3   Title  Patient reports pain increases </= 3 increments with standing & gait activities.    Baseline  Patient reports bilateral knee pain up to 8/10 with prosthesis wear, standing & gait.    Time  12    Period  Weeks    Status  On-going    Target Date  12/06/19      PT LONG TERM GOAL #4   Title  Standing balance with RW support reaching 10" anteriorly, picking up objects from floor & scanning environment safely modified independent.  (All LTGs Target Date 15 visits after evaluation)    Baseline  Standing balance with RW support with moderate assist reaching 2" anteriorly.    Time  12    Period  Weeks    Status  On-going    Target Date  12/06/19      PT LONG TERM GOAL #5   Title  Patient ambulates 150' with RW & prosthesis modified independent to enable basic community entry. (All LTGs Target Date 19 visits after evaluation)    Baseline  Patient ambulates 54' with RW & prosthesis with moderate assistance.    Time  12    Period  Weeks    Status  On-going    Target Date  12/06/19      PT LONG TERM GOAL #6   Title  Patient negotiates stairs with 2 rails, ramp & curb with RW & prosthesis modified independent to enable community access.  (All LTGs Target Date 19 visits after evaluation)    Baseline  Patient is non-ambulatory on stairs, ramps & curbs limiting community mobility.     Time  12    Period  Weeks    Status  On-going    Target Date  12/06/19         11/28/19 1025  Plan  Clinical Impression Statement Today's skilled session continued to focus on gait with RW/prosthesis/AFO to right LE. Seated rest breaks needed due to fatigue. SaO2 >/= 95% with session. HR  up to 120's with gait, decreased to high 90's with rest. The pt is progressing towad goals and should benefit from continued PT to progress toward unmet goals.  Personal Factors and Comorbidities Comorbidity 3+;Fitness;Past/Current  Experience;Finances;Time since onset of injury/illness/exacerbation  Comorbidities left Transtibial amputation, right Transmetatarsal amputation, OA, asthma, DM, depression, neuropathy, Bipolar  Examination-Activity Limitations Bed Mobility;Caring for Others;Carry;Lift;Locomotion Level;Stairs;Stand;Transfers  Examination-Participation Restrictions Community Activity;Driving;Meal Prep;Other (parenting 41yo dtr)  Pt will benefit from skilled therapeutic intervention in order to improve on the following deficits Abnormal gait;Decreased activity tolerance;Decreased balance;Decreased endurance;Decreased knowledge of use of DME;Decreased mobility;Decreased range of motion;Decreased strength;Dizziness;Impaired flexibility;Postural dysfunction;Prosthetic Dependency;Obesity;Pain  Stability/Clinical Decision Making Evolving/Moderate complexity  Rehab Potential Good  PT Frequency 2x / week (1x/wk for 3 weeks, then 2x/wk for 6 weeks)  PT Duration Other (comment) (9 weeks)  PT Treatment/Interventions ADLs/Self Care Home Management;DME Instruction;Gait training;Stair training;Functional mobility training;Therapeutic activities;Therapeutic exercise;Balance training;Neuromuscular re-education;Patient/family education;Prosthetic Training;Orthotic Fit/Training;Vestibular  PT Next Visit Plan check goals to sumit to Medicaid for more visits  Consulted and Agree with Plan of Care Patient          Patient will benefit from skilled therapeutic intervention in order to improve the following deficits and impairments:  Abnormal gait, Decreased activity tolerance, Decreased balance, Decreased endurance, Decreased knowledge of use of DME, Decreased mobility, Decreased range of motion, Decreased strength, Dizziness, Impaired flexibility, Postural dysfunction, Prosthetic Dependency, Obesity, Pain  Visit Diagnosis: Other abnormalities of gait and mobility  Unsteadiness on feet  Abnormal posture  Muscle  weakness  Foot drop, right     Problem List Patient Active Problem List   Diagnosis Date Noted  . UTI (urinary tract infection) 06/28/2019  . Right hand pain 06/23/2019  . Amputation above knee ([place]) 06/23/2019  . Left shoulder pain 06/23/2019  . Dysuria 06/14/2019  . Abdominal pain   . Fever   . Abscess of skin of abdomen   . Abscess of buttock, right 05/17/2019  . Hyperglycemia   . Severe protein-calorie malnutrition ([place])   . Amputated toe, right ([place]) 04/11/2019  . 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[place]) 04/11/2019  . Hypertriglyceridemia 04/11/2019  . Type 2 diabetes mellitus with diabetic polyneuropathy, with long-term current use of insulin ([place]) 04/11/2019  . Type 2 diabetes mellitus with hyperglycemia, with long-term current use of insulin ([place]) 04/11/2019  . Infection of right foot   . Cellulitis of great toe of right foot 10/29/2018  . Osteomyelitis of great toe of right foot ([place]) 10/29/2018  . Encounter for orthopedic aftercare following surgical amputation 06/28/2018  . Uncontrolled type 2 diabetes mellitus with insulin therapy ([place]) 04/16/2018  . Allergic rhinitis 03/06/2018  . Unilateral complete BKA, left, sequela ([place])   . Class 3 severe obesity due to excess calories with serious comorbidity and body mass index (BMI) of 50.0 to 59.9 in adult [facility])   . AKI (acute kidney injury) ([place])   . Decreased pedal pulses 06/10/2017  . Unilateral primary osteoarthritis, right knee 10/22/2016  . Primary osteoarthritis of first carpometacarpal joint of left hand 07/30/2016  . Diabetic neuropathy ([place]) 07/14/2016  . Nausea 03/17/2016  . De Quervain's tenosynovitis, bilateral 11/01/2015  . Vitamin D deficiency 09/05/2015  . Recurrent candidiasis of vagina 09/05/2015  . Varicose veins of leg with complications [id number]  . Restless leg syndrome 10/17/2014  . Chronic sinusitis 07/18/2014  . Headache 07/15/2014  . Encounter for chronic pain management 06/30/2013  . HLD  (hyperlipidemia) 11/19/2012  . Chest pain 06/27/2012  . Right carpal tunnel syndrome 09/01/2011  .  Bilateral knee pain 09/01/2011  . DM (diabetes mellitus) type II uncontrolled, periph vascular disorder ([place]) 05/22/2008  . Morbid obesity ([place]) 05/22/2008  . 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 Depression 05/22/2008  . Obstructive sleep apnea 05/22/2008  . Hypertension 05/22/2008  . Asthma 05/22/2008  . GERD 05/22/2008    Willow Ora, PTA, [facility] [address], [place], [place] [address] [phone] 11/30/19, 3:11 PM   Name: [name] MRN: [id number] Date of Birth: 15-Jul-1976

## 2019-12-01 DIAGNOSIS — E1140 Type 2 diabetes mellitus with diabetic neuropathy, unspecified: Secondary | ICD-10-CM | POA: Diagnosis not present

## 2019-12-02 DIAGNOSIS — E1140 Type 2 diabetes mellitus with diabetic neuropathy, unspecified: Secondary | ICD-10-CM | POA: Diagnosis not present

## 2019-12-03 DIAGNOSIS — E1140 Type 2 diabetes mellitus with diabetic neuropathy, unspecified: Secondary | ICD-10-CM | POA: Diagnosis not present

## 2019-12-03 LAB — SPECIMEN STATUS REPORT

## 2019-12-04 ENCOUNTER — Encounter: Payer: Medicaid Other | Admitting: Physical Therapy

## 2019-12-04 LAB — TOXASSURE SELECT 13 (MW), URINE

## 2019-12-05 ENCOUNTER — Encounter: Payer: Self-pay | Admitting: Physical Therapy

## 2019-12-05 ENCOUNTER — Other Ambulatory Visit: Payer: Self-pay

## 2019-12-05 ENCOUNTER — Ambulatory Visit: Payer: Medicaid Other | Admitting: Physical Therapy

## 2019-12-05 DIAGNOSIS — M79604 Pain in right leg: Secondary | ICD-10-CM

## 2019-12-05 DIAGNOSIS — M79605 Pain in left leg: Secondary | ICD-10-CM

## 2019-12-05 DIAGNOSIS — R293 Abnormal posture: Secondary | ICD-10-CM | POA: Diagnosis not present

## 2019-12-05 DIAGNOSIS — M21371 Foot drop, right foot: Secondary | ICD-10-CM | POA: Diagnosis not present

## 2019-12-05 DIAGNOSIS — R2689 Other abnormalities of gait and mobility: Secondary | ICD-10-CM

## 2019-12-05 DIAGNOSIS — M25651 Stiffness of right hip, not elsewhere classified: Secondary | ICD-10-CM

## 2019-12-05 DIAGNOSIS — E1140 Type 2 diabetes mellitus with diabetic neuropathy, unspecified: Secondary | ICD-10-CM | POA: Diagnosis not present

## 2019-12-05 DIAGNOSIS — M25652 Stiffness of left hip, not elsewhere classified: Secondary | ICD-10-CM | POA: Diagnosis not present

## 2019-12-05 DIAGNOSIS — M6281 Muscle weakness (generalized): Secondary | ICD-10-CM | POA: Diagnosis not present

## 2019-12-05 DIAGNOSIS — R2681 Unsteadiness on feet: Secondary | ICD-10-CM

## 2019-12-05 DIAGNOSIS — M25661 Stiffness of right knee, not elsewhere classified: Secondary | ICD-10-CM | POA: Diagnosis not present

## 2019-12-05 NOTE — Therapy (Addendum)
[facility] [address] [place], [place], [address] Phone: [phone]   Fax:  [phone]  Physical Therapy Treatment & Re-certification  Patient Details  Name: [name] MRN: [id number] Date of Birth: 1976/11/18 Referring Provider (PT): [name], MD   Encounter Date: 12/05/2019  PT End of Session - 12/05/19 1054    Visit Number  6    Number of Visits  21    Authorization Type  Medicaid    Authorization Time Period  12 visits 11/20-12/31    Authorization - Visit Number  4    Authorization - Number of Visits  12    PT Start Time  1100    PT Stop Time  1145    PT Time Calculation (min)  45 min    Equipment Utilized During Treatment  Gait belt    Activity Tolerance  Patient tolerated treatment well;Patient limited by fatigue    Behavior During Therapy  [facility] for tasks assessed/performed       Past Medical History:  Diagnosis Date  . Acute osteomyelitis of ankle or foot, left ([place]) 01/31/2018  . Alveolar hypoventilation   . Anemia    not on iron pill  . Asthma   . Bipolar 2 disorder ([place])   . Carpal tunnel syndrome on right    recurrent  . Cellulitis 08/2010-08/2011  . Chronic pain   . COPD (chronic obstructive pulmonary disease) ([name])    Symbicort daily and Proventil as needed  . Costochondritis   . Depression   . Diabetes mellitus type II, uncontrolled ([place]) 2000   Type 2, Uncontrolled.Takes Lantus daily.Fasting blood sugar runs 150  . Dizziness    occasionally  . Drug-seeking behavior   . GERD (gastroesophageal reflux disease)    takes Pantoprazole and Zantac daily  . Headache    migraine-last one about a yr ago.Topamax daily  . HLD (hyperlipidemia)    takes Atorvastatin daily  . Hypertension    takes Lisinopril and Coreg daily  . Morbid obesity ([place])   . Muscle spasm    takes Flexeril as needed  . Nocturia   . OSA on CPAP   . Peripheral neuropathy    takes Gabapentin daily  .  Pneumonia    "walking" several yrs ago and as a baby (12/05/2018)  . Rectal fissure   . Restless leg   . Syncope 02/25/2016  . Urinary frequency   . Varicose veins    Right medial thigh and Left leg     Past Surgical History:  Procedure Laterality Date  . AMPUTATION Left 02/01/2018   Procedure: LEFT FOURTH AND 5TH TOE RAY AMPUTATION;  Surgeon: [name], MD;  Location: [facility];  Service: Orthopedics;  Laterality: Left;  . AMPUTATION Left 03/03/2018   Procedure: LEFT BELOW KNEE AMPUTATION;  Surgeon: [name], MD;  Location: [facility];  Service: Orthopedics;  Laterality: Left;  . CARPAL TUNNEL RELEASE Bilateral   . CESAREAN SECTION  2007  . INCISION AND DRAINAGE PERIRECTAL ABSCESS Left 05/18/2019   Procedure: IRRIGATION AND DEBRIDEMENT OF PANNIS ABSCESS, POSSIBLE DEBRIDEMENT OF BUTTOCK WOUND;  Surgeon: Donnie Mesa, MD;  Location: [facility];  Service: General;  Laterality: Left;  . IRRIGATION AND DEBRIDEMENT BUTTOCKS Left 05/17/2019   Procedure: IRRIGATION AND DEBRIDEMENT BUTTOCKS;  Surgeon: Donnie Mesa, MD;  Location: [facility];  Service: General;  Laterality: Left;  . KNEE ARTHROSCOPY Right 07/17/2010  . LEFT HEART CATHETERIZATION WITH CORONARY ANGIOGRAM N/A 07/27/2012   Procedure: LEFT  HEART CATHETERIZATION WITH CORONARY ANGIOGRAM;  Surgeon: Sherren Mocha, MD;  Location: [facility] CATH LAB;  Service: Cardiovascular;  Laterality: N/A;  . MASS EXCISION N/A 06/29/2013   Procedure:  WIDE LOCAL EXCISION OF POSTERIOR NECK ABSCESS;  Surgeon: Ralene Ok, MD;  Location: [facility];  Service: General;  Laterality: N/A;  . REPAIR KNEE LIGAMENT Left    "fixed ligaments and chipped patella"  . right transmetatarsal amputation       There were no vitals filed for this visit.  Subjective Assessment - 12/05/19 1053    Subjective  No falls. Has not received brace yet, will come in 12/12/19. Just moved to new address: no stairs/ramp, small thresholdn on sidewalk on way into front door, paved entrace. Has to use  walker into/out of bedroom and use walker in bathroom. Non-accessible for w/c mobility in bathroom. Needs to get a toilet riser - PT discussed using bedside commode without bucket as toilet riser. Wears 4-6 hours daily and is sitting up. Wears liner 8-10 hours "as long as I'm up." "I have pain all the time in my LEs"    Pertinent History  Rt Transmetatarsal Amputation, LTTA, OA, COPD, asthma, uncontrolled DM2, depression, Neuropathy, Bipolar, carpal tunnel, morbid obesity, syncope    Limitations  Lifting;Standing;Walking;House hold activities    Patient Stated Goals  To walk in house & community    Currently in Pain?  Yes    Pain Score  5     Pain Location  Leg    Pain Orientation  Right;Left    Pain Descriptors / Indicators  Aching;Sore    Pain Type  Chronic pain    Pain Onset  More than a month ago         [facility] PT Assessment - 12/05/19 1108      ROM / Strength   AROM / PROM / Strength  AROM;PROM      AROM   Overall AROM   Deficits    Right/Left Hip  Right;Left   seated, resting pt preferred position   Right Hip External Rotation   -9    Left Hip External Rotation   -14      PROM   Overall PROM   Deficits    Right/Left Hip  Right;Left   seated   Right Hip External Rotation   24    Right Hip Internal Rotation   15    Left Hip External Rotation   33    Left Hip Internal Rotation   9                   [facility] Adult PT Treatment/Exercise - 12/05/19 1108      Transfers   Transfers  Sit to Stand;Stand to Sit    Sit to Stand  5: Supervision;With upper extremity assist;From chair/3-in-1;Other (comment)   uses RW to stabilized, R w/c armrest for push off    Sit to Stand Details  Verbal cues for technique;Verbal cues for safe use of DME/AE    Sit to Stand Details (indicate cue type and reason)  Cues to scoot to edge of seat     Stand to Sit  5: Supervision;With upper extremity assist;To chair/3-in-1;Other (comment)    Stand to Sit Details (indicate cue type and reason)   Verbal cues for sequencing;Verbal cues for safe use of DME/AE      Ambulation/Gait   Ambulation/Gait  Yes    Ambulation/Gait Assistance  4: Min guard    Ambulation/Gait Assistance Details  Cues  for wt shift, step width and length when ambulating. Pt presented significantly R toe-out with varus moment at the R knee in stance. PT assessed prosthetic alignment which was in line with patella correct.     Ambulation Distance (Feet)  40 Feet   limited by fatigue & SOB   Assistive device  Rolling walker;Prosthesis;Other (Comment)   R GrAFO anterior plate dynamic response    Gait Pattern  Step-to pattern;Decreased stride length;Decreased dorsiflexion - right;Decreased weight shift to left;Right foot flat;Antalgic;Lateral hip instability;Trunk flexed;Decreased step length - right;Decreased stance time - left;Right flexed knee in stance;Left flexed knee in stance    Ambulation Surface  Indoor;Level    Stairs  Yes    Stairs Assistance  1: +2 Total assist    Stairs Assistance Details (indicate cue type and reason)  Demo & verbal cues for sequencing and foot placement for stair negotiation. Pt able to initiate RLE advancement onto first step with min guard, challenged to wt shift onto RLE in order to advance LLE onto step requiring maxA from PT and SPT. Pt unable to lead with LLE to next step d/t weakness and was significantly fatigued. Pt descending backwards leading with RLE requiring cues for sequencing.     Stair Management Technique  Two rails;Step to pattern;Forwards   lead with RLE, step back down    Number of Stairs  1    Height of Stairs  6    Curb  1: +2 Total assist   with RW   Curb Details (indicate cue type and reason)  Attempted curb negotiation, pt able to advance RW onto curb, advance RLE forward, however was unable to advance LLE forward onto 6-7" curb. Cues for sequencing and foot placement. LOB posteriorly requiring to step backwards off curb with maxA from therapist. Simulated curb  negotiation with 4" step ascending and descending forward with min-modA to steady. Cues for sequencing and foot placement.       Therapeutic Activites    Therapeutic Activities  Other Therapeutic Activities    Other Therapeutic Activities  RW support, RLE GrAFO & LLE prosthesis -- scans right/left over shoulder with equal weight shift and BUE support, reaches 10" anteriorly and towards floor within 7.5" to pick up object with min guard.        Prosthetics   Prosthetic Care Comments   PT instructed pt to set alarm for 11PM in case falling asleep with liner on will be woken up to take off to prevent wearing liner throughout night which would lead to skin integrity issues.     Current prosthetic wear tolerance (days/week)   daily    Current prosthetic wear tolerance (#hours/day)   reports 4-6hrs at minimum daily, wears liner 8-10 hours (as long as she is up)    Residual limb condition   --    Education Provided  Residual limb care;Proper wear schedule/adjustment;Proper weight-bearing schedule/adjustment    Person(s) Educated  Patient    Education Method  Explanation    Education Method  Verbalized understanding             PT Education - 12/05/19 1139    Education Details  POC - 1x/week for 3 weeks, on hold for re-authorization, then 2x/week for 6 weeks.    Person(s) Educated  Patient    Methods  Explanation    Comprehension  Verbalized understanding       PT Short Term Goals - 12/05/19 1338      PT SHORT TERM GOAL #1  Title  Patient verbalizes consistant wear of prosthesis for 6 hours/daily. (All updated STGs target date 01/07/2020)    Baseline  12/05/19: 4-6 hours daily, wears liner all wake hours    Time  3    Period  Weeks    Status  On-going    Target Date  01/07/20      PT SHORT TERM GOAL #2   Title  Patient tolerates 3 minutes of standing activities with UE support with vital signs WNL.    Baseline  Patient tolerates 1 minute of standing activities with UE support with  supervision. Vital signs WNL but dyspnea.    Time  3    Period  Weeks    Status  On-going    Target Date  01/07/20      PT SHORT TERM GOAL #3   Title  Patient able to reach to floor with RW support with supervision.    Baseline  12/05/19: Patient reaches within 7.5" of floor with RW support with minA.    Time  3    Period  Weeks    Status  Revised    Target Date  01/07/20      PT SHORT TERM GOAL #4   Title  Patient ambulates 3' with RW & prosthesis and personal GrAFO with supervision.    Baseline  12/05/19: Patient ambulated 84' with RW & prosthesis & clinic's GrAFO with minA., longest distance during cert period = 40'    Time  3    Period  Weeks    Status  Revised    Target Date  01/07/20            12/05/19 1109  PT LONG TERM GOAL #1  Title Patient verbalizes & demonstrates proper prosthetic care to enable safe use of prosthesis. (All LTGs Target Date 19 visits after evaluation)  Baseline 12/05/19: MET  Time 12  Period Weeks  Status Achieved  PT LONG TERM GOAL #2  Title Patient tolerates prosthesis wear daily for >90% of awake hours with skin issues. (All LTGs Target Date 19 visits after evaluation)  Baseline 12/05/19: wears liner most wake hours 8-10, wears prosthesis 4-6 hours daily  Time 12  Period Weeks  Status Partially Met  PT LONG TERM GOAL #3  Title Patient reports pain increases </= 3 increments with standing & gait activities.  Baseline 12/05/19: Patient reports bilateral knee pain up to 8/10 with prosthesis wear, standing & gait with baseline at lowest 5/10  Time 12  Period Weeks  Status Partially Met  PT LONG TERM GOAL #4  Title Standing balance with RW support reaching 10" anteriorly, picking up objects from floor & scanning environment safely modified independent.  (All LTGs Target Date 15 visits after evaluation)  Baseline 12/05/19: not met but progressing -- Standing balance with RW support with moderate assist reaching 10" anteriorly, pick up object  on floor 7.5", able to look over bilateral shoulder with wt shift with supervision-minA  Time 12  Period Weeks  Status Not Met  PT LONG TERM GOAL #5  Title Patient ambulates 150' with RW & prosthesis modified independent to enable basic community entry. (All LTGs Target Date 19 visits after evaluation)  Baseline 12/04/19: Patient ambulates 62' with RW & prosthesis with minA  Time 12  Period Weeks  Status Not MET  PT LONG TERM GOAL #6  Title Patient negotiates stairs with 2 rails, ramp & curb with RW & prosthesis modified independent to enable community access.  (All LTGs  Target Date 19 visits after evaluation)  Baseline 12/05/19: Patient is non-ambulatory on stairs, ramps & curbs limiting community mobility.  Time 12  Period Weeks  Status Not MET       PT Long Term Goals - 12/05/19 1343      PT LONG TERM GOAL #1   Title  Patient verbalizes & demonstrates proper orthotic care and maintains current mod-I prosthetic care ability to enable safe use of orthosis & prosthesis. (All LTGs Target Date 02/21/20)    Baseline  12/05/2019  Patient is scheduled to receive her first AFO on 12/11/2018 and is dependent in care / use.    Time  9    Period  Weeks    Status  Revised    Target Date  02/21/20      PT LONG TERM GOAL #2   Title  Patient tolerates prosthetic liner wear for >90% and prosthetic wear >50% of awake hours without skin integrity issues for improved mobility.    Baseline  12/05/19: wears liner most wake hours 8-10, wears prosthesis 4-6 hours daily. She is scheduled to receive AFO on 12/11/2018 so no wear yet.    Time  9    Period  Weeks    Status  Revised    Target Date  02/21/20      PT LONG TERM GOAL #3   Title  Patient reports pain increases </= 3 increments with standing & gait increased activities noted in LTGs with RW, prosthesis, and orthosis.    Baseline  12/05/19: Patient reports bilateral knee pain up to 8/10 with prosthesis wear, standing & gait with baseline at lowest  5/10    Time  13    Period  Weeks    Status  Revised    Target Date  02/21/20      PT LONG TERM GOAL #4   Title  Standing balance with RW support reaching 10" anteriorly, picking up objects from floor & scanning environment safely modified independent.    Baseline  12/05/19: Standing balance with RW support with moderate assist reaching 10" anteriorly, pick up object on floor 7.5", able to look over bilateral shoulder with wt shift with supervision-minA    Time  13    Period  Weeks    Status  Revised    Target Date  02/21/20      PT LONG TERM GOAL #5   Title  Patient ambulates 65' with RW & prosthesis & orthosis modified independent to enable basic community entry.    Baseline  12/05/19: Patient ambulates 23' with RW & prosthesis & clinic's GrAFO with minA    Time  9    Period  Weeks    Status  Revised    Target Date  02/21/20      PT LONG TERM GOAL #6   Title  Patient negotiates ramp & curb with RW & prosthesis & orthosis minA from family to enable community access.    Baseline  12/05/19: Patient is non-ambulatory on stairs, ramps & curbs limiting community mobility.    Time  13    Period  Weeks    Status  Revised    Target Date  02/21/20           Plan - 12/05/19 1054    Clinical Impression Statement  Today's skilled session focused on assessment of long-term functional goals. Pt is making slow progress towards goals. Will continue to increase prosthetic wear goal for pressure tolerance & activity tolerance, however, pt  has wear liner for all wake hours. Her standing balance has improved demonstrating ability to reach forward, reach to the floor to pick up an object and appropriately wt shift to allow to look over shoulder. Pt presented with significant toed-out gait although prosthesis aligned correctly, with hip rotation ROM deficits bilaterally noted. She will receive a GrAFO anterior plate dynamic response on 12/11/18 and will required education in regards to care/wear time  and training for gait and negotiating stairs/ramps/curbs for improved community mobility. She recently moved and is navigating a new living environment which requires training for safety during gait and transfers, especially to negotiate a threshold prior to entering the house. Pt can benefit from continued skilled therapy services to address deficits and progress towards functional goals.    Personal Factors and Comorbidities  Comorbidity 3+;Fitness;Past/Current Experience;Finances;Time since onset of injury/illness/exacerbation    Comorbidities  left Transtibial amputation, right Transmetatarsal amputation, OA, asthma, DM, depression, neuropathy, Bipolar    Examination-Activity Limitations  Bed Mobility;Caring for Others;Carry;Lift;Locomotion Level;Stairs;Stand;Transfers    Examination-Participation Restrictions  Community Activity;Driving;Meal Prep;Other   parenting 41yo dtr   Stability/Clinical Decision Making  Evolving/Moderate complexity    Rehab Potential  Good    PT Frequency  2x / week   1x/wk for 3 weeks, then 2x/wk for 6 weeks   PT Duration  Other (comment)   9 weeks   PT Treatment/Interventions  ADLs/Self Care Home Management;DME Instruction;Gait training;Stair training;Functional mobility training;Therapeutic activities;Therapeutic exercise;Balance training;Neuromuscular re-education;Patient/family education;Prosthetic Training;Orthotic Fit/Training;Vestibular    PT Next Visit Plan  gait training with GrAFO and LLE prosthesis, modified stair/ramp/curb negotiation, w/c mobility over threshold, standing balance, Nustep for aerobic endurance    Consulted and Agree with Plan of Care  Patient       Patient will benefit from skilled therapeutic intervention in order to improve the following deficits and impairments:  Abnormal gait, Decreased activity tolerance, Decreased balance, Decreased endurance, Decreased knowledge of use of DME, Decreased mobility, Decreased range of motion, Decreased  strength, Dizziness, Impaired flexibility, Postural dysfunction, Prosthetic Dependency, Obesity, Pain  Visit Diagnosis: Other abnormalities of gait and mobility  Unsteadiness on feet  Abnormal posture  Muscle weakness  Foot drop, right  Stiffness of left hip, not elsewhere classified  Stiffness of right hip, not elsewhere classified  Stiffness of right knee, not elsewhere classified  Pain in left leg  Pain in right leg     Problem List Patient Active Problem List   Diagnosis Date Noted  . UTI (urinary tract infection) 06/28/2019  . Right hand pain 06/23/2019  . Amputation above knee ([facility]) 06/23/2019  . Left shoulder pain 06/23/2019  . Dysuria 06/14/2019  . Abdominal pain   . Fever   . Abscess of skin of abdomen   . Abscess of buttock, right 05/17/2019  . Hyperglycemia   . Severe protein-calorie malnutrition ([place])   . Amputated toe, right ([place]) 04/11/2019  . 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[place]) 04/11/2019  . Hypertriglyceridemia 04/11/2019  . Type 2 diabetes mellitus with diabetic polyneuropathy, with long-term current use of insulin ([place]) 04/11/2019  . Type 2 diabetes mellitus with hyperglycemia, with long-term current use of insulin ([place]) 04/11/2019  . Infection of right foot   . Cellulitis of great toe of right foot 10/29/2018  . Osteomyelitis of great toe of right foot ([place]) 10/29/2018  . Encounter for orthopedic aftercare following surgical amputation 06/28/2018  . Uncontrolled type 2 diabetes mellitus with insulin therapy ([place]) 04/16/2018  . Allergic rhinitis 03/06/2018  . Unilateral complete BKA, left, sequela (  [place])   . Class 3 severe obesity due to excess calories with serious comorbidity and body mass index (BMI) of 50.0 to 59.9 in adult [facility])   . AKI (acute kidney injury) ([place])   . Decreased pedal pulses 06/10/2017  . Unilateral primary osteoarthritis, right knee 10/22/2016  . Primary osteoarthritis of first carpometacarpal joint of left hand 07/30/2016  .  Diabetic neuropathy ([place]) 07/14/2016  . Nausea 03/17/2016  . De Quervain's tenosynovitis, bilateral 11/01/2015  . Vitamin D deficiency 09/05/2015  . Recurrent candidiasis of vagina 09/05/2015  . Varicose veins of leg with complications 86/48/4720  . Restless leg syndrome 10/17/2014  . Chronic sinusitis 07/18/2014  . Headache 07/15/2014  . Encounter for chronic pain management 06/30/2013  . HLD (hyperlipidemia) 11/19/2012  . Chest pain 06/27/2012  . Right carpal tunnel syndrome 09/01/2011  . Bilateral knee pain 09/01/2011  . DM (diabetes mellitus) type II uncontrolled, periph vascular disorder ([place]) 05/22/2008  . Morbid obesity ([place]) 05/22/2008  . 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 Depression 05/22/2008  . Obstructive sleep apnea 05/22/2008  . Hypertension 05/22/2008  . Asthma 05/22/2008  . GERD 05/22/2008   Juliann Pulse, SPT 12/05/2019, 4:35 PM  During this evaluation / treatment session, the Physical Therapist/Physical Therapist Assistant was present and participating in the session. This entire session was performed under direct supervision and direction of a licensed therapist/therapist assistant . I have personally read, edited and approve of the note as written.  Jamey Reas PT, DPT 12/05/2019, 5:05 PM   [facility] [address] [place], [place], [address] Phone: [phone]   Fax:  [phone]  Name: [name] MRN: [id number] Date of Birth: [birth year]

## 2019-12-06 ENCOUNTER — Encounter: Payer: Medicaid Other | Admitting: Physical Therapy

## 2019-12-06 DIAGNOSIS — E1140 Type 2 diabetes mellitus with diabetic neuropathy, unspecified: Secondary | ICD-10-CM | POA: Diagnosis not present

## 2019-12-07 DIAGNOSIS — G4733 Obstructive sleep apnea (adult) (pediatric): Secondary | ICD-10-CM | POA: Diagnosis not present

## 2019-12-07 DIAGNOSIS — E669 Obesity, unspecified: Secondary | ICD-10-CM | POA: Diagnosis not present

## 2019-12-07 DIAGNOSIS — M86172 Other acute osteomyelitis, left ankle and foot: Secondary | ICD-10-CM | POA: Diagnosis not present

## 2019-12-07 DIAGNOSIS — J45909 Unspecified asthma, uncomplicated: Secondary | ICD-10-CM | POA: Diagnosis not present

## 2019-12-08 DIAGNOSIS — E1140 Type 2 diabetes mellitus with diabetic neuropathy, unspecified: Secondary | ICD-10-CM | POA: Diagnosis not present

## 2019-12-08 IMAGING — DX DG FOOT COMPLETE 3+V*R*
3 series · 3 of 3 positions shown · non-contrast
Comparison: 08/15/2018

CLINICAL DATA: Red and purple discoloration of great toe with
yellow pus coming from the side, ingrown toenail, swelling, fever,
diabetes mellitus

EXAM:
RIGHT FOOT COMPLETE - 3+ VIEW

[foot ap]
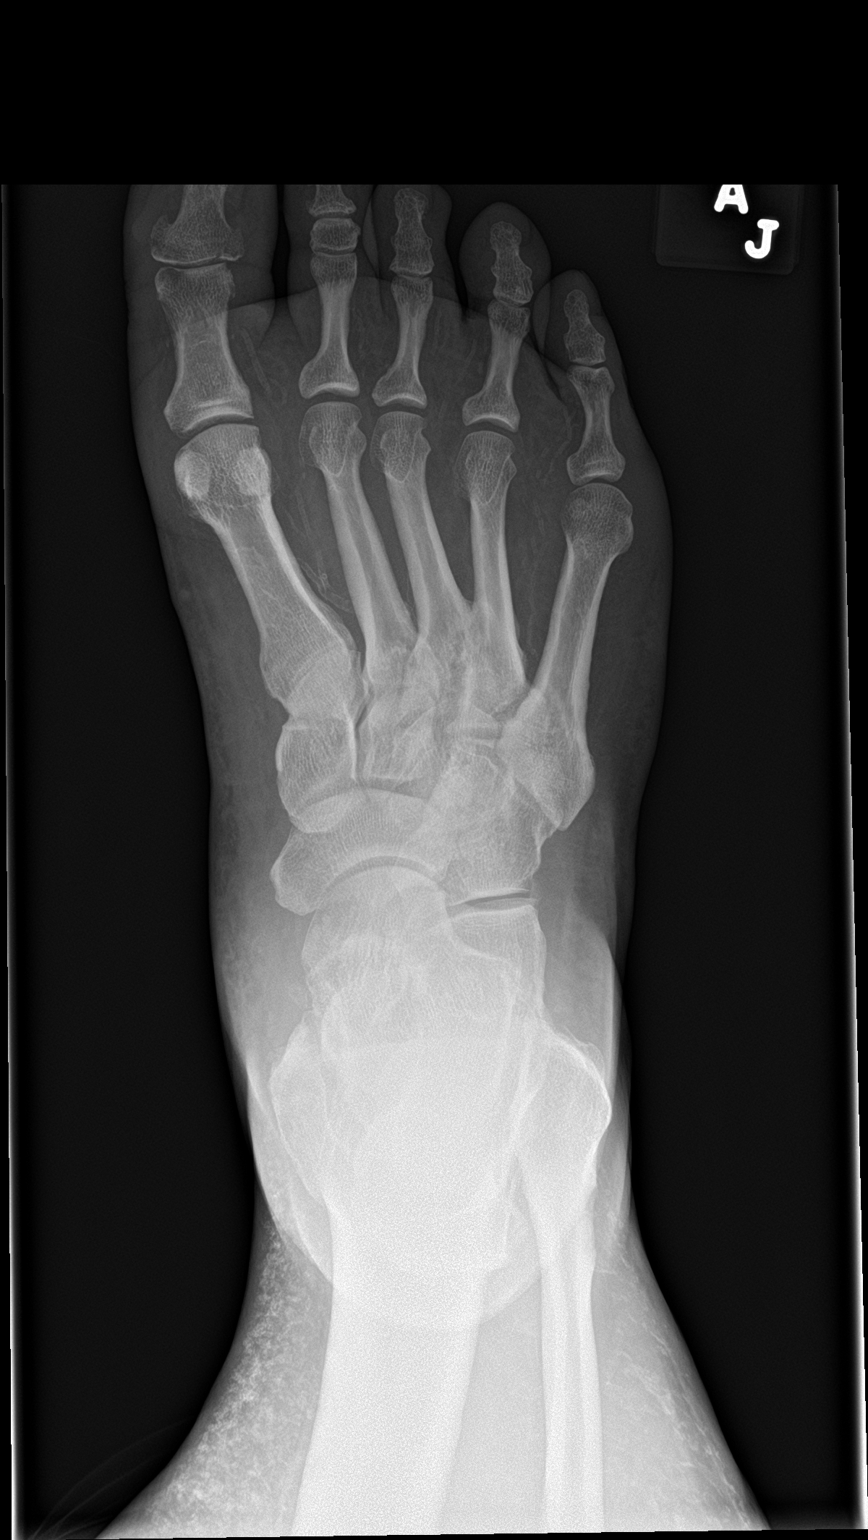

[foot obl]
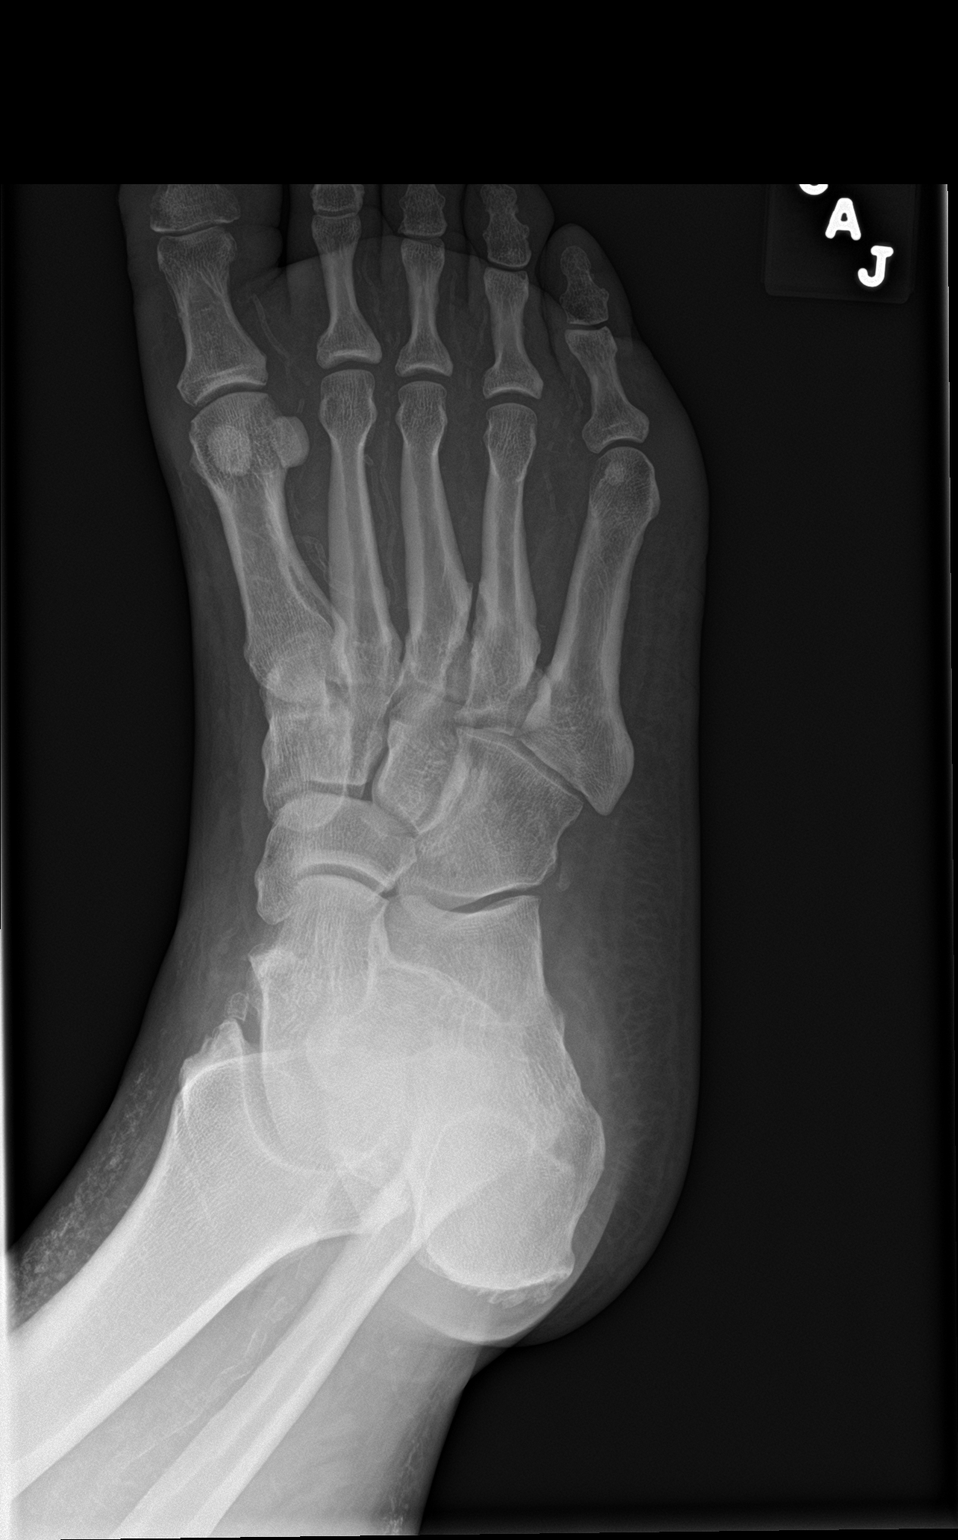

[foot lat]
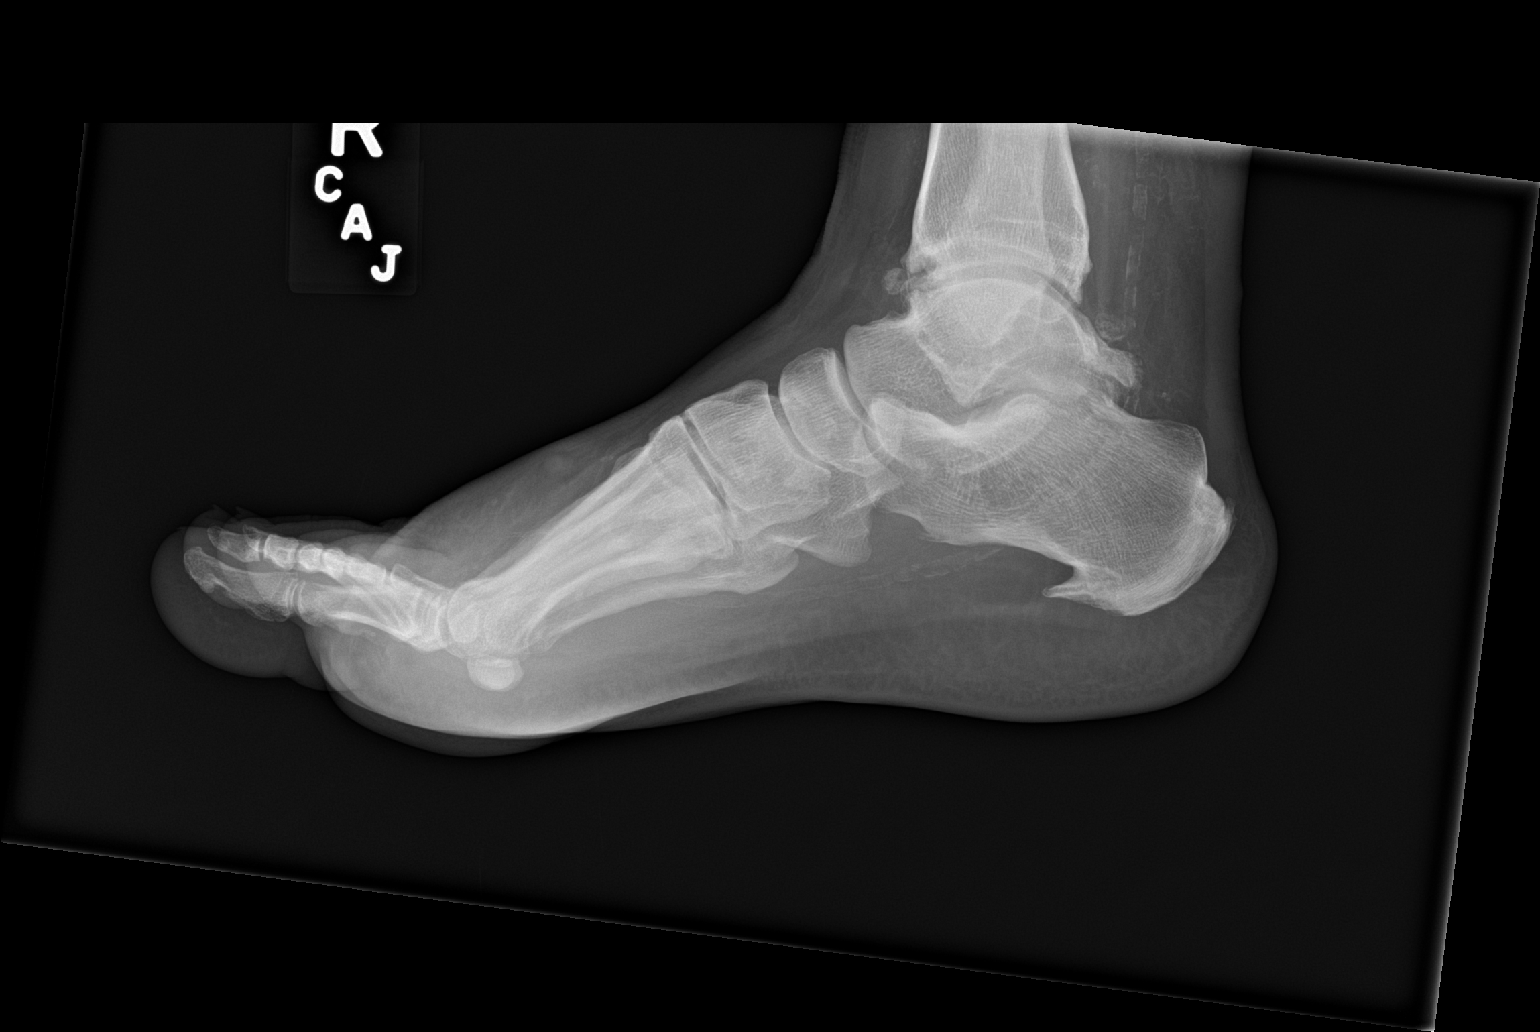

[3 of 3 positions shown; findings below may reference images not displayed]

FINDINGS: Osseous mineralization normal.

Joint spaces preserved.

Small sharply defined defect identified at the articular surface,
base of distal phalanx great toe laterally, unchanged since
08/15/2018; no interval changes to suggest healing fracture.

No additional fracture, dislocation, or bone destruction.

Scattered small vessel vascular calcifications.

Soft tissue swelling great toe and at dorsum of forefoot without
soft tissue gas.
IMPRESSION: Soft tissue swelling without definite acute bony findings.

Cortical lucency at articular surface, base of distal phalanx great
toe, question artifact versus nondisplaced fracture.

Plantar calcaneal spurring.

## 2019-12-08 IMAGING — MR MR TOES*R* WO/W CM
10 series · 40 of 40 positions shown · IV contrast (gadavist)
Comparison: Foot radiographs 08/02/2016, 08/15/2018 and 10/29/2018.

CLINICAL DATA: Great toe infection following ingrown toenail
procedure. Repeated debridement. Discoloration with drainage.
Evaluate for osteomyelitis. History of diabetes and left BKA for
osteomyelitis.

EXAM:
MRI OF THE RIGHT TOES WITHOUT AND WITH CONTRAST
TECHNIQUE: Multiplanar, multisequence MR imaging of the right toes was
performed both before and after administration of intravenous
contrast.
CONTRAST:  10 cc Gadavist

[Series 5: T1 · axial · right · 3.0mm · 0.47mm/px · z∈[-144,-49]mm · 5 of 33 slices shown (1 of 2)]
[im 1/33]
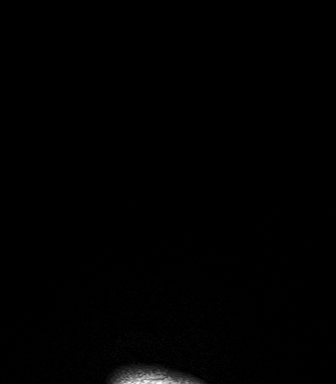
[im 9/33]
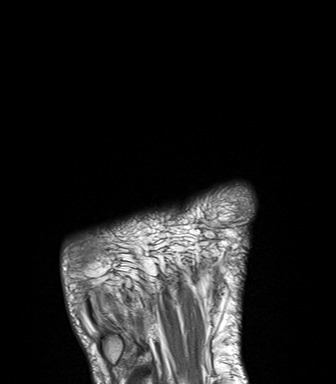
[im 17/33]
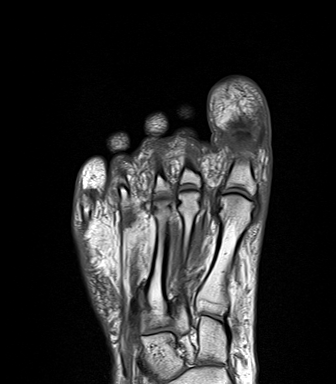
[im 25/33]
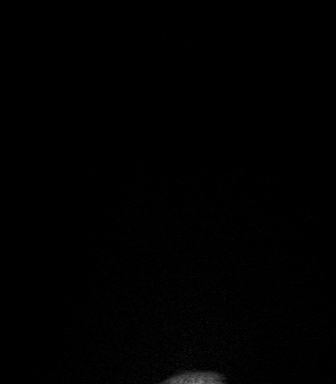
[im 33/33]
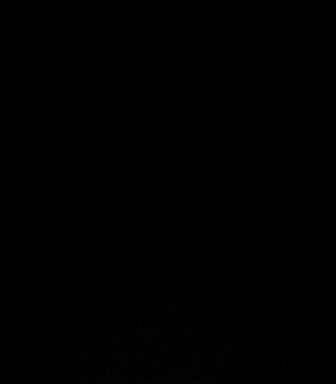

[Series 6: T2 fat-sat · axial · right · 3.0mm · 0.47mm/px · z∈[-146,-51]mm · 4 of 33 slices shown (1 of 2)]
[im 1/33]
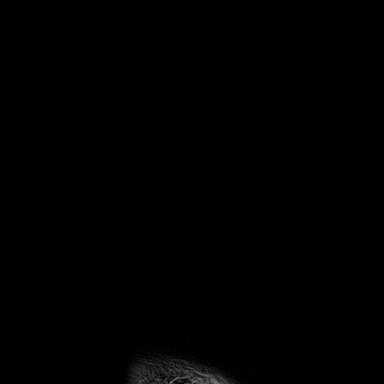
[im 11/33]
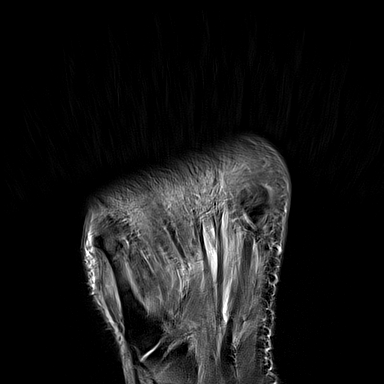
[im 22/33]
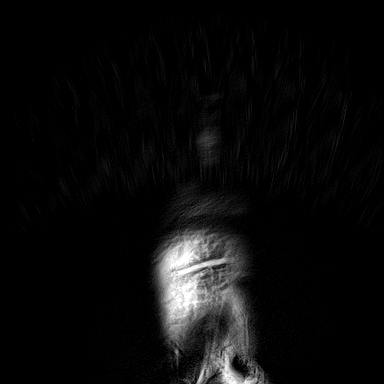
[im 33/33]
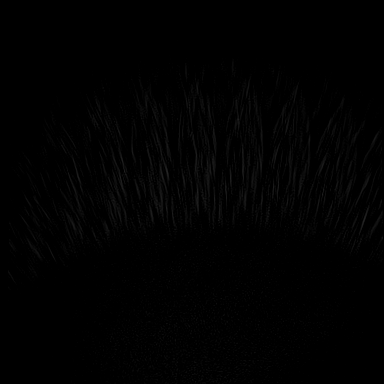

[Series 7: T1 · coronal · right · 3.0mm · 0.52mm/px · 5 of 48 slices shown (2 of 2)]
[im 1/48]
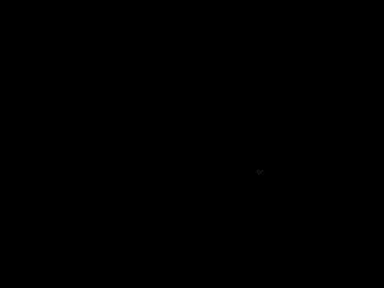
[im 12/48]
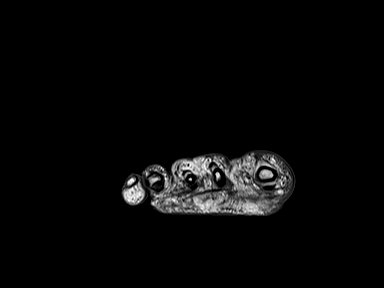
[im 24/48]
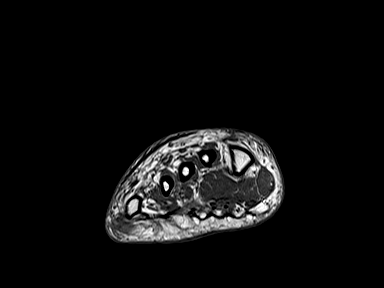
[im 36/48]
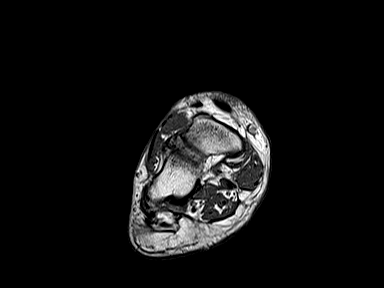
[im 48/48]
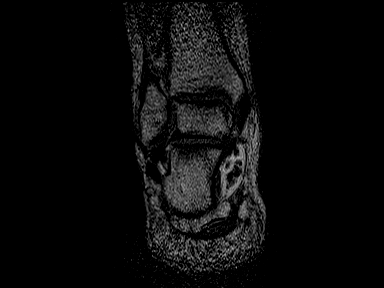

[Series 9: T2 fat-sat · coronal · right · 3.0mm · 0.36mm/px · 5 of 48 slices shown (2 of 2)]
[im 1/48]
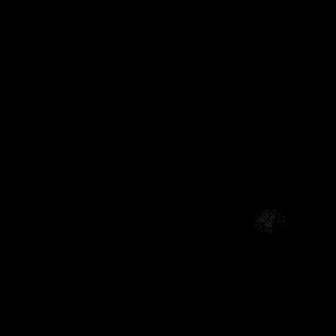
[im 12/48]
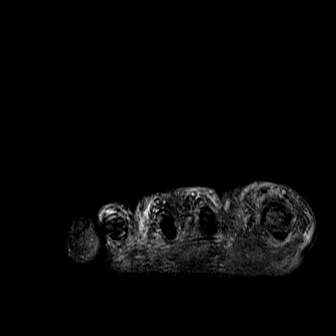
[im 24/48]
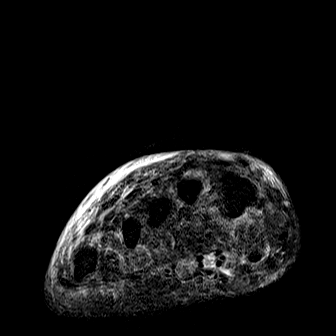
[im 36/48]
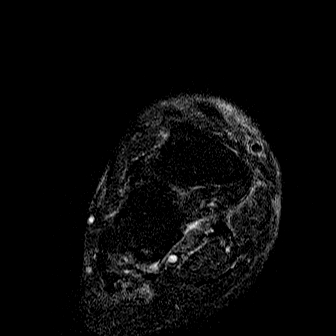
[im 48/48]
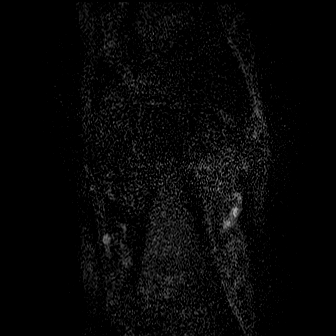

[Series 10: T1 fat-sat · axial · non-contrast · right · 3.0mm · 0.59mm/px · z∈[-144,-49]mm · 3 of 31 slices shown (1 of 3)]
[im 1/31]
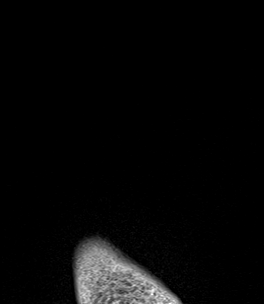
[im 16/31]
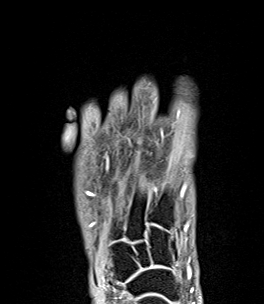
[im 31/31]
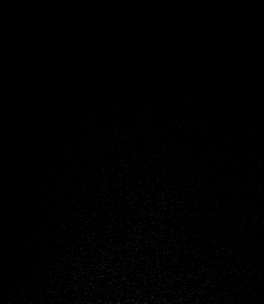

[Series 11: STIR · sagittal · right · 3.0mm · 0.70mm/px · 3 of 32 slices shown (1 of 2)]
[im 1/32]
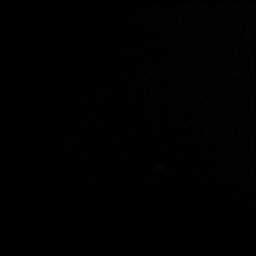
[im 16/32]
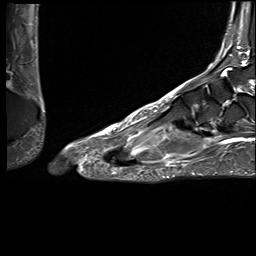
[im 32/32]
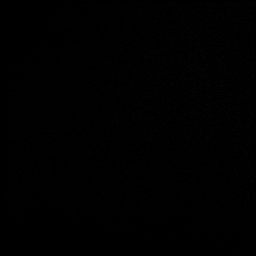

[Series 12: STIR · axial · right · 3.0mm · 0.70mm/px · z∈[-146,-51]mm · 4 of 33 slices shown (2 of 2)]
[im 1/33]
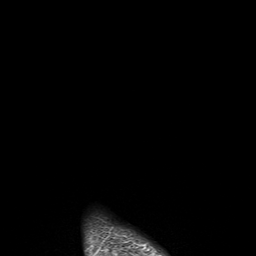
[im 11/33]
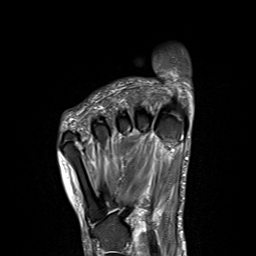
[im 22/33]
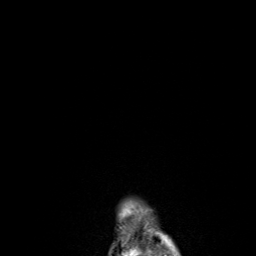
[im 33/33]
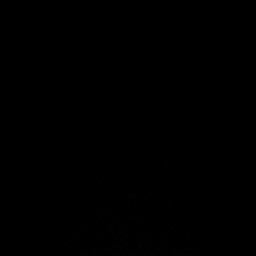

[Series 13: T1 fat-sat post-contrast · axial · right · 3.0mm · 0.59mm/px · z∈[-144,-49]mm · 3 of 32 slices shown]
[im 1/32]
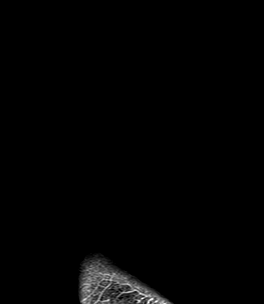
[im 16/32]
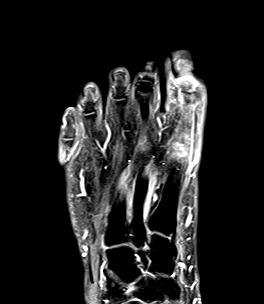
[im 32/32]
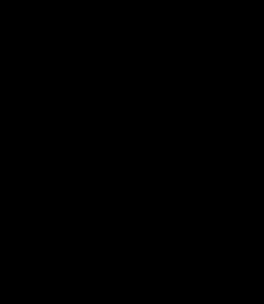

[Series 14: T1 fat-sat · sagittal · right · 3.0mm · 0.59mm/px · 3 of 32 slices shown (2 of 3)]
[im 1/32]
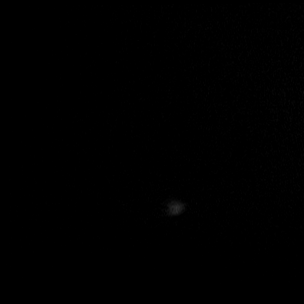
[im 16/32]
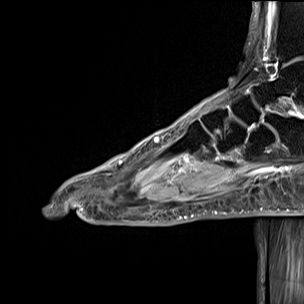
[im 32/32]
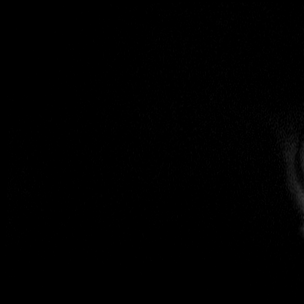

[Series 15: T1 fat-sat · coronal · right · 3.0mm · 0.56mm/px · 5 of 48 slices shown (3 of 3)]
[im 1/48]
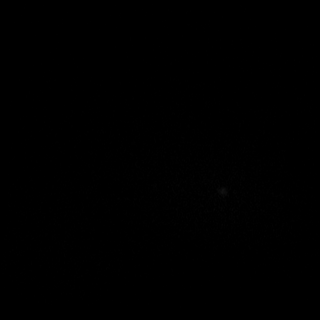
[im 12/48]
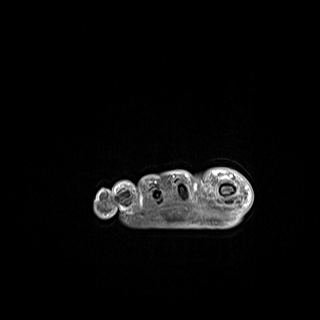
[im 24/48]
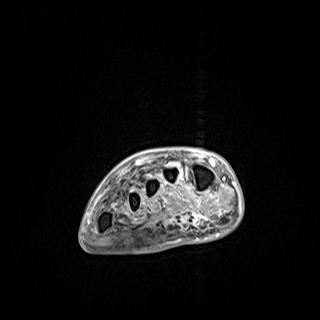
[im 36/48]
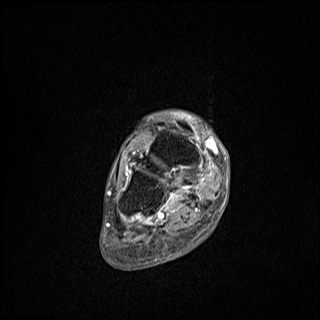
[im 48/48]
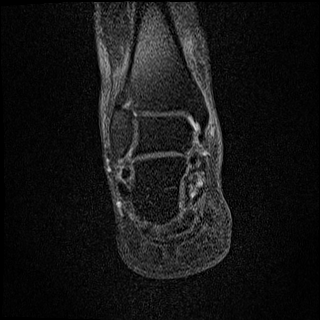

[40 of 40 positions shown; findings below may reference images not displayed]

FINDINGS: Despite efforts by the technologist and patient, mild motion
artifact is present on today's exam and could not be eliminated.
This reduces exam sensitivity and specificity.

Bones/Joint/Cartilage

There is abnormal marrow T2 hyperintensity and decreased T1 signal
in the medial base of the distal 1st phalanx. The cortex in this
area appears ill-defined the coronal T1 weighted images. Following
contrast, there is diffuse marrow enhancement. Mild T2
hyperintensity and enhancement are present within the head the
proximal phalanx as well. There is no significant joint effusion.

The additional toes appear normal. The metatarsals and visualized
tarsal bones appear normal. There are no significant joint
effusions.

Ligaments

Intact Lisfranc ligament.

Muscles and Tendons

No focal muscular abnormality or abnormal enhancement. No
significant tenosynovitis.

Soft tissues

There is mild periarticular soft tissue enhancement surrounding the
interphalangeal joint of the great toe and extending into the nail
bed. No focal fluid collection demonstrated.
IMPRESSION: 1. Abnormal T2 and T1 marrow signal adjacent to the interphalangeal
joint of the great toe with associated marrow and surrounding soft
tissue enhancement, supportive of osteomyelitis in this clinical
context.
2. No evidence of soft tissue abscess.
3. The additional toes, metatarsals and visualized tarsal bones
appear normal.

## 2019-12-09 DIAGNOSIS — E1140 Type 2 diabetes mellitus with diabetic neuropathy, unspecified: Secondary | ICD-10-CM | POA: Diagnosis not present

## 2019-12-10 ENCOUNTER — Other Ambulatory Visit: Payer: Self-pay | Admitting: Family Medicine

## 2019-12-10 ENCOUNTER — Telehealth: Payer: Self-pay | Admitting: Internal Medicine

## 2019-12-10 DIAGNOSIS — E1140 Type 2 diabetes mellitus with diabetic neuropathy, unspecified: Secondary | ICD-10-CM | POA: Diagnosis not present

## 2019-12-10 NOTE — Telephone Encounter (Signed)
Patient moved and has been unable to find her blood sugar meter and supplies sincve moving. Patient requests a new RX for glucose meter with all supplies for new meter be sent to:  [place], [place] Phone:  [phone]  Fax:  [phone]

## 2019-12-11 ENCOUNTER — Other Ambulatory Visit: Payer: Self-pay

## 2019-12-11 DIAGNOSIS — E1140 Type 2 diabetes mellitus with diabetic neuropathy, unspecified: Secondary | ICD-10-CM | POA: Diagnosis not present

## 2019-12-11 DIAGNOSIS — J45909 Unspecified asthma, uncomplicated: Secondary | ICD-10-CM | POA: Diagnosis not present

## 2019-12-11 DIAGNOSIS — G4733 Obstructive sleep apnea (adult) (pediatric): Secondary | ICD-10-CM | POA: Diagnosis not present

## 2019-12-11 MED ORDER — ACCU-CHEK NANO SMARTVIEW W/DEVICE KIT: PACK | 0 refills | Status: DC

## 2019-12-11 MED ORDER — ACCU-CHEK SOFTCLIX LANCETS MISC: 12 refills | Status: DC

## 2019-12-11 MED ORDER — ACCU-CHEK GUIDE VI STRP: ORAL_STRIP | 5 refills | Status: DC

## 2019-12-11 NOTE — Telephone Encounter (Signed)
Refills for testing supplies have been sent to requested pharmacy.

## 2019-12-12 DIAGNOSIS — E1140 Type 2 diabetes mellitus with diabetic neuropathy, unspecified: Secondary | ICD-10-CM | POA: Diagnosis not present

## 2019-12-13 ENCOUNTER — Ambulatory Visit: Payer: Medicaid Other | Admitting: Internal Medicine

## 2019-12-13 DIAGNOSIS — E1140 Type 2 diabetes mellitus with diabetic neuropathy, unspecified: Secondary | ICD-10-CM | POA: Diagnosis not present

## 2019-12-13 NOTE — Progress Notes (Deleted)
Name: [name]  Age/ Sex: 44 y.o., female   MRN/ DOB: [id number], 1976/06/09     PCP: [name], MD   Reason for Endocrinology Evaluation: Type {NUMBERS 1 OR 2:522190} Diabetes Mellitus  Initial Endocrine Consultative Visit: ***    PATIENT IDENTIFIER: [name] is a 44 y.o. female with a past medical history of ***. The patient has followed with Endocrinology clinic since *** for consultative assistance with management of her diabetes.  DIABETIC HISTORY:  [name] was diagnosed with DM*** ***, and started insulin therapy approximately *** years after diagnosis. ***. Her hemoglobin A1c has ranged from *** in ***, peaking at *** in ***.   SUBJECTIVE:   During the last visit (***): ***  Today (12/13/2019): [name]  She checks her blood sugars *** times daily, preprandial to breakfast and ***. The patient has *** had hypoglycemic episodes since the last clinic visit, which typically occur *** x / - most often occuring ***. The patient is *** symptomatic with these episodes, with symptoms of {symptoms; hypoglycemia:9084048}. Otherwise, the patient {HAS/HAS NOT:522402} required any recent emergency interventions for hypoglycemia and {HAS/HAS NOT:522402} had recent hospitalizations secondary to hyper or hypoglycemic episodes.    ROS: As per HPI and as detailed below: ROS    HOME DIABETES REGIMEN:    Statin: *** ACE-I/ARB: *** Prior Diabetic Education: ***   METER DOWNLOAD SUMMARY: Date range evaluated: *** Fingerstick Blood Glucose Tests = *** Average Number Tests/Day = *** Overall Mean FS Glucose = *** Standard Deviation = ***  BG Ranges: Low = *** High = ***   Hypoglycemic Events/30 Days: BG < 50 = *** Episodes of symptomatic severe hypoglycemia = ***    DIABETIC COMPLICATIONS: Microvascular complications:   ***  Denies:   Last Eye Exam: Completed    Macrovascular complications:   ***  Denies: CAD, CVA, PVD   HISTORY:  Past Medical History:  Past Medical History:  Diagnosis Date  . Acute osteomyelitis of ankle or foot, left ([facility]) 01/31/2018  . Alveolar hypoventilation   . Anemia    not on iron pill  . Asthma   . Bipolar 2 disorder ([facility])   . Carpal tunnel syndrome on right    recurrent  . Cellulitis 08/2010-08/2011  . Chronic pain   . COPD (chronic obstructive pulmonary disease) ([name])    Symbicort daily and Proventil as needed  . Costochondritis   . Depression   . Diabetes mellitus type II, uncontrolled ([facility]) 2000   Type 2, Uncontrolled.Takes Lantus daily.Fasting blood sugar runs 150  . Dizziness    occasionally  . Drug-seeking behavior   . GERD (gastroesophageal reflux disease)    takes Pantoprazole and Zantac daily  . Headache    migraine-last one about a yr ago.Topamax daily  . HLD (hyperlipidemia)    takes Atorvastatin daily  . Hypertension    takes Lisinopril and Coreg daily  . Morbid obesity ([facility])   . Muscle spasm    takes Flexeril as needed  . Nocturia   . OSA on CPAP   . Peripheral neuropathy    takes Gabapentin daily  . Pneumonia    "walking" several yrs ago and as a baby (12/05/2018)  . Rectal fissure   . Restless leg   . Syncope 02/25/2016  . Urinary frequency   . Varicose veins    Right medial thigh and Left leg     Past Surgical History:  Past Surgical History:  Procedure Laterality Date  . AMPUTATION  Left 02/01/2018   Procedure: LEFT FOURTH AND 5TH TOE RAY AMPUTATION;  Surgeon: [name], MD;  Location: [facility];  Service: Orthopedics;  Laterality: Left;  . AMPUTATION Left 03/03/2018   Procedure: LEFT BELOW KNEE AMPUTATION;  Surgeon: [name], MD;  Location: [facility];  Service: Orthopedics;  Laterality: Left;  . CARPAL TUNNEL RELEASE Bilateral   . CESAREAN SECTION  2007  . INCISION AND DRAINAGE PERIRECTAL ABSCESS Left 05/18/2019   Procedure: IRRIGATION AND DEBRIDEMENT OF PANNIS  ABSCESS, POSSIBLE DEBRIDEMENT OF BUTTOCK WOUND;  Surgeon: Donnie Mesa, MD;  Location: [facility];  Service: General;  Laterality: Left;  . IRRIGATION AND DEBRIDEMENT BUTTOCKS Left 05/17/2019   Procedure: IRRIGATION AND DEBRIDEMENT BUTTOCKS;  Surgeon: Donnie Mesa, MD;  Location: [facility];  Service: General;  Laterality: Left;  . KNEE ARTHROSCOPY Right 07/17/2010  . LEFT HEART CATHETERIZATION WITH CORONARY ANGIOGRAM N/A 07/27/2012   Procedure: LEFT HEART CATHETERIZATION WITH CORONARY ANGIOGRAM;  Surgeon: Sherren Mocha, MD;  Location: [facility] CATH [facility];  Service: Cardiovascular;  Laterality: N/A;  . MASS EXCISION N/A 06/29/2013   Procedure:  WIDE LOCAL EXCISION OF POSTERIOR NECK ABSCESS;  Surgeon: Ralene Ok, MD;  Location: [facility];  Service: General;  Laterality: N/A;  . REPAIR KNEE LIGAMENT Left    "fixed ligaments and chipped patella"  . right transmetatarsal amputation        Social History:  reports that she quit smoking about 26 years ago. Her smoking use included cigarettes. She has a 0.09 pack-year smoking history. She has never used smokeless tobacco. She reports current drug use. She reports that she does not drink alcohol. Family History:  Family History  Problem Relation Age of Onset  . Diabetes Mother   . Hyperlipidemia Mother   . Depression Mother   . Varicose Veins Mother   . Heart attack Paternal Uncle   . Heart disease Paternal Grandmother   . Heart attack Paternal Grandmother   . Heart attack Paternal Grandfather   . Heart disease Paternal Grandfather   . Heart attack Father   . Cancer Maternal Grandmother        COLON  . Hypertension Maternal Grandmother   . Hyperlipidemia Maternal Grandmother   . Diabetes Maternal Grandmother   . Other Maternal Grandfather        GUN SHOT      HOME MEDICATIONS: Allergies as of 12/13/2019      Reactions   Kiwi Extract Shortness Of Breath, Swelling   Cefepime    AKI, see records from [facility] hospitalization in January 2020   Trental  [pentoxifylline] Nausea And Vomiting   Nubain [nalbuphine Hcl] Other (See Comments)   "FEELS LIKE SOMETHING CRAWLING ON ME"      Medication List       Accurate as of December 13, 2019  7:41 AM. If you have any questions, ask your nurse or doctor.        Accu-Chek Guide test strip Generic drug: glucose blood USE T0 CHECK BLOOD SUGAR 3 TIMES DAILY   Accu-Chek Nano SmartView w/Device Kit Use to check blood sugar 3 times daily   Accu-Chek Softclix Lancets lancets Use as instructed to check blood sugar 3 times per day   amLODipine 10 MG tablet Commonly known as: NORVASC Take 1 tablet (10 mg total) by mouth daily.   atorvastatin 20 MG tablet Commonly known as: LIPITOR Take 1 tablet (20 mg total) by mouth daily.   budesonide-formoterol 160-4.5 MCG/ACT inhaler Commonly known as: Symbicort inhale 2 PUFFS  into THE lungs 2 TIMES DAILY   Cholecalciferol 25 MCG (1000 UT) tablet Take 1 tablet (1,000 Units total) by mouth daily.   cyclobenzaprine 5 MG tablet Commonly known as: FLEXERIL TAKE ONE TABLET BY MOUTH THREE TIMES A DAY. AS NEEDED FOR MUSCLE SPASMS   gabapentin 600 MG tablet Commonly known as: NEURONTIN Take 2 tablets (1,200 mg total) by mouth 3 (three) times daily.   HYDROcodone-acetaminophen 10-325 MG tablet Commonly known as: NORCO Take 1 tablet by mouth every 8 (eight) hours as needed.   HYDROcodone-acetaminophen 10-325 MG tablet Commonly known as: NORCO Take 1 tablet by mouth every 8 (eight) hours as needed.   HYDROcodone-acetaminophen 10-325 MG tablet Commonly known as: NORCO Take 1 tablet by mouth every 8 (eight) hours as needed.   Insulin Isophane & Regular Human (70-30) 100 UNIT/ML PEN Commonly known as: HUMULIN 70/30 MIX Inject 70 Units into the skin daily with breakfast AND 35 Units daily with supper.   Ozempic (0.25 or 0.5 MG/DOSE) 2 MG/1.5ML Sopn Generic drug: Semaglutide(0.25 or 0.5MG/DOS) Inject 0.5 mg into the skin once a week.   pantoprazole 40  MG tablet Commonly known as: PROTONIX Take 1 tablet (40 mg total) by mouth daily.   Proventil HFA 108 (90 Base) MCG/ACT inhaler Generic drug: albuterol Inhale 1-2 puffs into the lungs every 6 (six) hours as needed for wheezing or shortness of breath. What changed: See the new instructions.        OBJECTIVE:   Vital Signs: There were no vitals taken for this visit.  Wt Readings from Last 3 Encounters:  07/12/19 297 lb 6.4 oz (134.9 kg)  06/22/19 296 lb (134.3 kg)  05/25/19 296 lb 4.8 oz (134.4 kg)     Exam: General: Pt appears well and is in NAD  Hydration: Well-hydrated with moist mucous membranes and good skin turgor  HEENT: Head: Unremarkable with good dentition. Oropharynx clear without exudate.  Eyes: External eye exam normal without stare, lid lag or exophthalmos.  EOM intact.  PERRL.  Neck: General: Supple without adenopathy. Thyroid: Thyroid size normal.  No goiter or nodules appreciated. No thyroid bruit.  Lungs: Clear with good BS bilat with no rales, rhonchi, or wheezes  Heart: RRR with normal S1 and S2 and no gallops; no murmurs; no rub  Abdomen: Normoactive bowel sounds, soft, nontender, without masses or organomegaly palpable  Extremities: No pretibial edema. No tremor. Normal strength and motion throughout. See detailed diabetic foot exam below.  Skin: Normal texture and temperature to palpation. No rash noted. No Acanthosis nigricans/skin tags. No lipohypertrophy.  Neuro: MS is good with appropriate affect, pt is alert and Ox3    DM foot exam: Please see diabetic assessment flow-sheet detailed below:           DATA REVIEWED:  Lab Results  Component Value Date   HGBA1C 17.5 (H) 05/17/2019   HGBA1C 10.1 (H) 12/06/2018   HGBA1C 9.6 (A) 04/25/2018   Lab Results  Component Value Date   MICROALBUR 17.4 (H) 01/24/2015   [place] Comment 09/19/2018   CREATININE 0.97 06/22/2019   Lab Results  Component Value Date   MICRALBCREAT 728.0 (H) 01/24/2015      Lab Results  Component Value Date   CHOL 343 (H) 09/19/2018   HDL 35 (L) 09/19/2018   LDLCALC Comment 09/19/2018   LDLDIRECT 164.9 11/15/2014   TRIG 456 (H) 09/19/2018   CHOLHDL 9.8 (H) 09/19/2018         ASSESSMENT / PLAN / RECOMMENDATIONS:  1) Type {NUMBERS 1 OR 2:522190} Diabetes Mellitus, ***controlled, With *** complications - Most recent A1c of *** %. Goal A1c < *** %.  ***  Plan: MEDICATIONS:  ***  EDUCATION / INSTRUCTIONS:  BG monitoring instructions: Patient is instructed to check her blood sugars *** times a day, ***.  Call [facility] if: BG persistently < 70 or > 300. . I reviewed the Rule of 15 for the treatment of hypoglycemia in detail with the patient. Literature supplied.  REFERRALS:  ***.   2) Diabetic complications:   Eye: Does *** have known diabetic retinopathy.   Neuro/ Feet: Does *** have known diabetic peripheral neuropathy .   Renal: Patient does *** have known baseline CKD. She   is *** on an ACEI/ARB at present. Check urine albumin/creatinine ratio yearly starting at time of diagnosis. If albuminuria is positive, treatment is geared toward better glucose, blood pressure control and use of ACE inhibitors or ARBs. Monitor electrolytes and creatinine once to twice yearly.   3) Lipids: Patient is *** on a statin.  4) Hypertension: *** at goal of < 140/90 mmHg.    F/U in ***    Signed electronically by: Mack Guise, MD  [facility]  [facility] [address] [place], [place] [address] Phone: [phone] FAX: [phone]   CC: [name], [place] [address] Phone: [phone]  Fax: [phone]  Return to Endocrinology clinic as below: Future Appointments  Date Time Provider [place]  12/13/2019 10:50 AM Elaysha Bevard, Melanie Crazier, MD [facility] None  12/21/2019 11:30 AM [name], MD [facility]  [facility]  12/24/2019 10:15 AM Isaias Cowman, PT [facility]  12/31/2019 10:15 AM Isaias Cowman, PT [facility]  01/04/2020 10:15 AM Drema Balzarine, PTA [facility]  01/16/2020  1:15 PM Isaias Cowman, PT [facility]  01/18/2020 10:15 AM Drema Balzarine, PTA [facility]  01/23/2020  1:15 PM Isaias Cowman, PT [facility]  01/25/2020 10:15 AM Drema Balzarine, PTA [facility]  01/30/2020  1:15 PM Isaias Cowman, PT [facility]  02/01/2020 10:15 AM Drema Balzarine, PTA [facility]  02/06/2020  1:15 PM Isaias Cowman, PT [facility]  02/08/2020 10:15 AM Drema Balzarine, PTA [facility]  02/13/2020  1:15 PM Isaias Cowman, PT [facility]  02/15/2020 10:15 AM Drema Balzarine, PTA [facility]  02/20/2020  1:15 PM Isaias Cowman, PT [facility]  02/22/2020 10:15 AM Drema Balzarine, PTA [facility]

## 2019-12-14 DIAGNOSIS — E1140 Type 2 diabetes mellitus with diabetic neuropathy, unspecified: Secondary | ICD-10-CM | POA: Diagnosis not present

## 2019-12-15 DIAGNOSIS — E1140 Type 2 diabetes mellitus with diabetic neuropathy, unspecified: Secondary | ICD-10-CM | POA: Diagnosis not present

## 2019-12-16 DIAGNOSIS — E1140 Type 2 diabetes mellitus with diabetic neuropathy, unspecified: Secondary | ICD-10-CM | POA: Diagnosis not present

## 2019-12-17 ENCOUNTER — Telehealth: Payer: Self-pay | Admitting: Family Medicine

## 2019-12-17 DIAGNOSIS — E1140 Type 2 diabetes mellitus with diabetic neuropathy, unspecified: Secondary | ICD-10-CM | POA: Diagnosis not present

## 2019-12-17 DIAGNOSIS — N3001 Acute cystitis with hematuria: Secondary | ICD-10-CM

## 2019-12-17 NOTE — Telephone Encounter (Signed)
FPTS After Hours Line Note  [name] calls the after-hours line to report that she began having increased back pain, blood in her urine and dysuria today.  She also reports increased urinary frequency.  She denies fevers.  She says that she had a UTI a couple months ago and has had red and white urine before.  When asked about the urology referral that was placed as well as the appointment that she was given in September 2020, she says that she never got a call from urology and does not remember being told about this appointment.  I advised [name] that she should be seen in our clinic so that her urine can be tested and cultured.  I will hold off on antibiotics until her urine is tested since according to a note from her PCP on 07/12/2019, patient has had these symptoms continuously in the past without any growth on urine culture and has been exposed to many antibiotics in the past.  This appointment was made for our access to care clinic at 4:10 PM on 1/12.  I reassured [name] that her symptoms do not seem to be an emergency but gave her precautions such as development of dizziness concerning for low blood pressure or fever as signs to go to the emergency department.  I have also sent another referral to urology.  Will forward to PCP and provider seeing her tomorrow as an FYI.  Amanda C. Shan Levans, MD PGY-3, [facility] 12/17/2019 11:52 PM

## 2019-12-18 ENCOUNTER — Other Ambulatory Visit: Payer: Self-pay

## 2019-12-18 ENCOUNTER — Emergency Department (HOSPITAL_COMMUNITY)
Admission: EM | Admit: 2019-12-18 | Discharge: 2019-12-18 | Disposition: A | Discharge status: Home / Self Care | Payer: Medicaid Other | Attending: Emergency Medicine | Admitting: Emergency Medicine

## 2019-12-18 ENCOUNTER — Ambulatory Visit: Payer: Medicaid Other

## 2019-12-18 ENCOUNTER — Encounter (HOSPITAL_COMMUNITY): Payer: Self-pay

## 2019-12-18 DIAGNOSIS — R319 Hematuria, unspecified: Secondary | ICD-10-CM | POA: Insufficient documentation

## 2019-12-18 DIAGNOSIS — E1140 Type 2 diabetes mellitus with diabetic neuropathy, unspecified: Secondary | ICD-10-CM | POA: Diagnosis not present

## 2019-12-18 DIAGNOSIS — E1165 Type 2 diabetes mellitus with hyperglycemia: Secondary | ICD-10-CM | POA: Diagnosis not present

## 2019-12-18 DIAGNOSIS — M5489 Other dorsalgia: Secondary | ICD-10-CM | POA: Diagnosis not present

## 2019-12-18 DIAGNOSIS — R52 Pain, unspecified: Secondary | ICD-10-CM | POA: Diagnosis not present

## 2019-12-18 DIAGNOSIS — Z5321 Procedure and treatment not carried out due to patient leaving prior to being seen by health care provider: Secondary | ICD-10-CM | POA: Insufficient documentation

## 2019-12-18 LAB — URINALYSIS, ROUTINE W REFLEX MICROSCOPIC
Bilirubin Urine: NEGATIVE
Glucose, UA: >=500 mg/dL — AB
Ketones, ur: NEGATIVE mg/dL
Nitrite: NEGATIVE
Protein, ur: 100 mg/dL — AB
RBC / HPF: >50 RBC/hpf — ABNORMAL HIGH (ref 0–5)
Specific Gravity, Urine: 1.026 (ref 1.005–1.030)
WBC, UA: >50 WBC/hpf — ABNORMAL HIGH (ref 0–5)
pH: 6 (ref 5.0–8.0)

## 2019-12-18 NOTE — Progress Notes (Deleted)
   Subjective:    Patient ID: [name], female    DOB: 12-02-1976, 44 y.o.   MRN: [id number]   CC:  HPI: Urinary complaints Patient scheduled for today's visit by Dr. Shan Levans for concern of UTI. Patient reported to Dr. Shan Levans that she was having increased back pain, blood in the urine, and dysuria. She also had increased urinary frequency. At time of call no fevers. Previously seen by with macrobid and fosfomycin as patient is chronically on bactrim. Has been referred to urology but no f/u appointments seen in EMR. Seen by PCP on 07/12/19 at which time patient was advised to only treat if positive cultures as has had similar symptoms without culture positive growth and has had significant abx exposure.   Smoking status reviewed  Review of Systems  Diabetes taking humalin 70/30 70 units in AM and 35 units in PM. Past due for follow up with endocrinology, willing to schedule appointment.   Objective:  There were no vitals taken for this visit. Vitals and nursing note reviewed  General: well nourished, in no acute distress HEENT: normocephalic, TM's visualized bilaterally, no scleral icterus or conjunctival pallor, no nasal discharge, moist mucous membranes, good dentition without erythema or discharge noted in posterior oropharynx Neck: supple, non-tender, without lymphadenopathy Cardiac: RRR, clear S1 and S2, no murmurs, rubs, or gallops Respiratory: clear to auscultation bilaterally, no increased work of breathing Abdomen: soft, nontender, nondistended, no masses or organomegaly. Bowel sounds present Extremities: no edema or cyanosis. Warm, well perfused. 2+ radial and PT pulses bilaterally Skin: warm and dry, no rashes noted Neuro: alert and oriented, no focal deficits   Assessment & Plan:    No problem-specific Assessment & Plan notes found for this encounter.    No follow-ups on file.   [name], DO, PGY-3

## 2019-12-18 NOTE — ED Triage Notes (Signed)
Patient arrived stating she is concerned that she may have a UTI due to dark colored urine and lower abdominal pain when urinating. Possibly had blood in urine today. History of UTIs in the past. EMS states blood sugar elevated at 543 and took 35 units prior to transport. Given 500 ml en route.

## 2019-12-18 NOTE — ED Notes (Signed)
Pt heard yelling from room and nursing staff about sitting in the chair in the triage room. Pt stated to triage RN, "I'm going to sue y'all mother fuckers when I end up in this floor". Triage RN attempted to move pt to a more comfortable position but pt still yelling at triage RN.

## 2019-12-18 NOTE — ED Notes (Signed)
Pt seen leaving the ED and getting into a car outside of ED.

## 2019-12-18 NOTE — ED Notes (Signed)
Patient yelling help from the triage room. When this nurse arrived patient requested to lay down. This nurse asked patient if she was able to transfer herself and she states she was able. When patient attempted to move, she stood up and then began yelling she needed help, this nurse was leading her to the chair from behind her. Patient yelling she needs help from the front but sat back down. Patient yells "Fine! Ill just sit down and when I fall out of this chair I am going to sue all you motherfuckers." This nurse explained I was able to get to her from the front but we can try again. Patient refused.

## 2019-12-18 NOTE — ED Notes (Signed)
Pt requested to be taken to lobby so family could pick her up; pt states she is leaving. IV from EMS removed in triage.

## 2019-12-19 DIAGNOSIS — E1140 Type 2 diabetes mellitus with diabetic neuropathy, unspecified: Secondary | ICD-10-CM | POA: Diagnosis not present

## 2019-12-20 DIAGNOSIS — E1140 Type 2 diabetes mellitus with diabetic neuropathy, unspecified: Secondary | ICD-10-CM | POA: Diagnosis not present

## 2019-12-21 ENCOUNTER — Ambulatory Visit: Payer: Medicaid Other | Admitting: Family Medicine

## 2019-12-21 DIAGNOSIS — E1140 Type 2 diabetes mellitus with diabetic neuropathy, unspecified: Secondary | ICD-10-CM | POA: Diagnosis not present

## 2019-12-21 IMAGING — DX DG CHEST 1V PORT
1 series · 1 of 1 positions shown · non-contrast
Comparison: None.

CLINICAL DATA: PICC line partially pulled out. Evaluate integrity
of the line.

EXAM:
PORTABLE CHEST 1 VIEW

[chest ap]
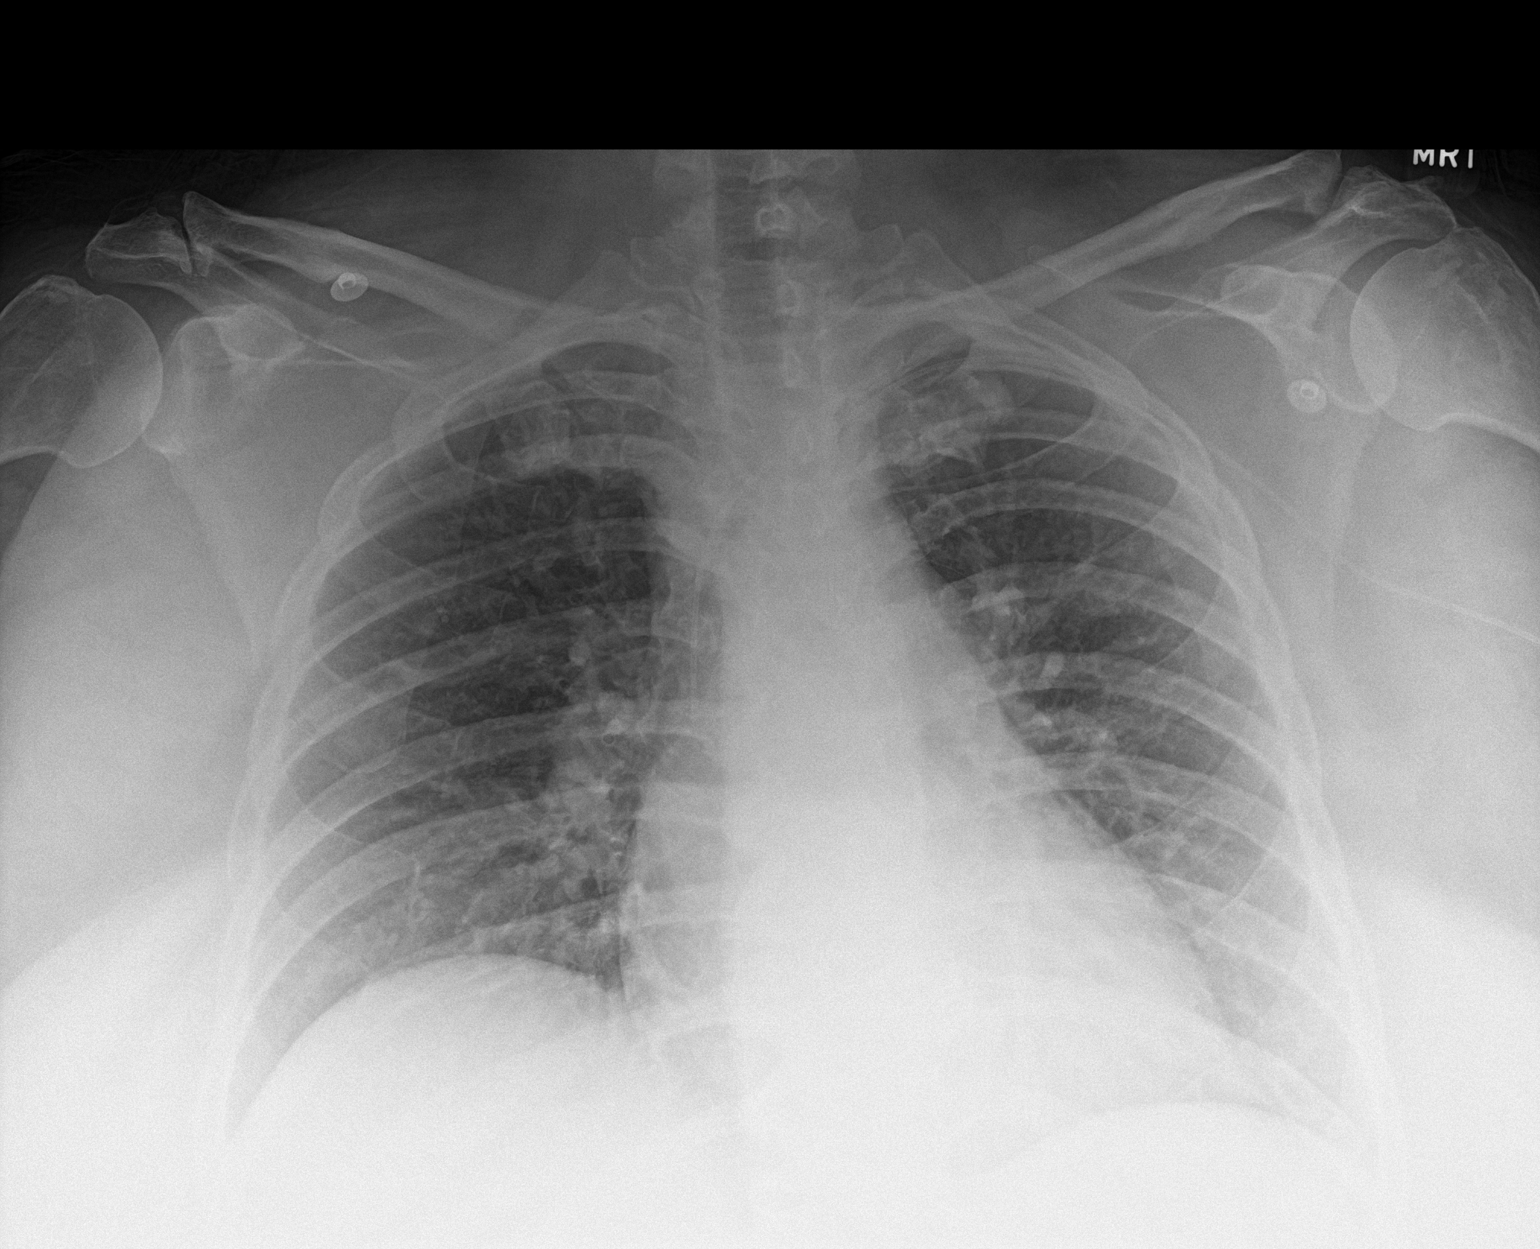

[1 of 1 positions shown; findings below may reference images not displayed]

FINDINGS: Left upper extremity PICC line with the tip at the cavoatrial
junction. Visualized portions of the PICC line are intact. Stable
cardiomediastinal silhouette. Normal pulmonary vascularity. Low lung
volumes. No focal consolidation, pleural effusion, or pneumothorax.
No acute osseous abnormality.
IMPRESSION: 1. Intact left upper extremity PICC line with tip at the cavoatrial
junction.
2. Low lung volumes.  No active disease.

## 2019-12-21 IMAGING — DX DG HUMERUS 2V *L*
2 series · 2 of 2 positions shown · non-contrast
Comparison: None.

CLINICAL DATA: Left upper extremity PICC line partially pulled out.
Evaluate line integrity.

EXAM:
LEFT HUMERUS - 2+ VIEW

[humerus ap]
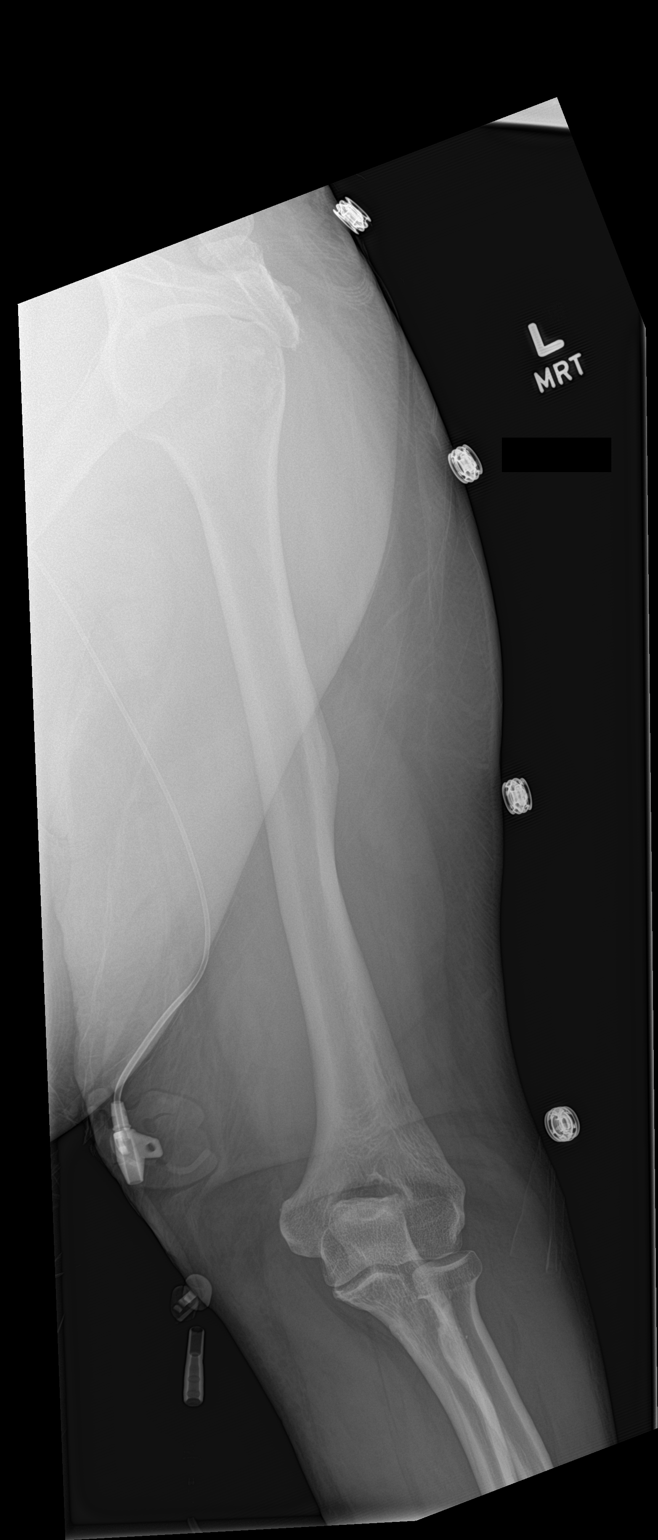

[humerus lat]
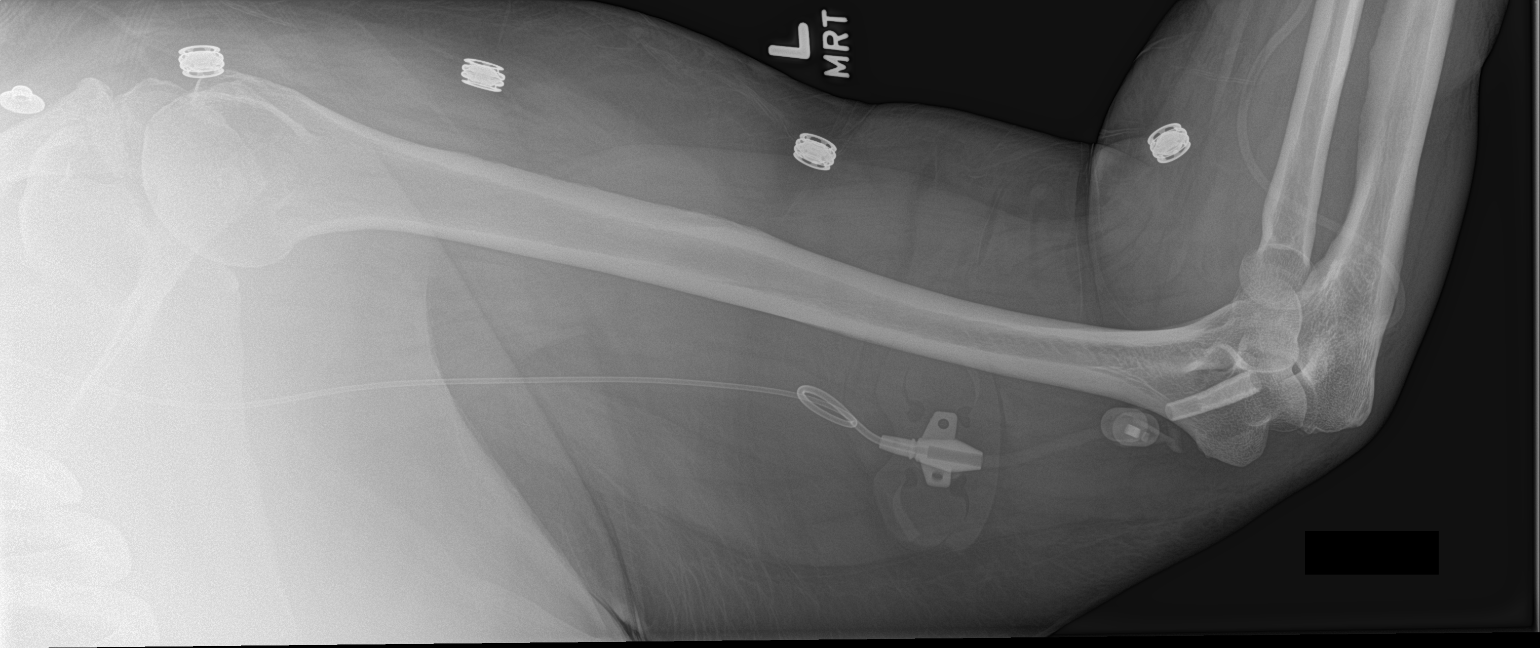

[2 of 2 positions shown; findings below may reference images not displayed]

FINDINGS: Left upper extremity PICC line appears intact. Apparent loop within
the proximal catheter just beyond the hub on the lateral view is
secondary to overlap, as there is no loop on the frontal view. No
acute fracture or dislocation. No focal bone lesion. Soft tissues
are unremarkable.
IMPRESSION: Intact PICC line.

## 2019-12-22 DIAGNOSIS — E1140 Type 2 diabetes mellitus with diabetic neuropathy, unspecified: Secondary | ICD-10-CM | POA: Diagnosis not present

## 2019-12-23 DIAGNOSIS — E1140 Type 2 diabetes mellitus with diabetic neuropathy, unspecified: Secondary | ICD-10-CM | POA: Diagnosis not present

## 2019-12-24 ENCOUNTER — Ambulatory Visit: Payer: Medicaid Other | Admitting: Physical Therapy

## 2019-12-24 DIAGNOSIS — E1140 Type 2 diabetes mellitus with diabetic neuropathy, unspecified: Secondary | ICD-10-CM | POA: Diagnosis not present

## 2019-12-25 DIAGNOSIS — E1140 Type 2 diabetes mellitus with diabetic neuropathy, unspecified: Secondary | ICD-10-CM | POA: Diagnosis not present

## 2019-12-26 DIAGNOSIS — E1140 Type 2 diabetes mellitus with diabetic neuropathy, unspecified: Secondary | ICD-10-CM | POA: Diagnosis not present

## 2019-12-27 DIAGNOSIS — E1140 Type 2 diabetes mellitus with diabetic neuropathy, unspecified: Secondary | ICD-10-CM | POA: Diagnosis not present

## 2019-12-29 DIAGNOSIS — E1140 Type 2 diabetes mellitus with diabetic neuropathy, unspecified: Secondary | ICD-10-CM | POA: Diagnosis not present

## 2019-12-30 DIAGNOSIS — E1140 Type 2 diabetes mellitus with diabetic neuropathy, unspecified: Secondary | ICD-10-CM | POA: Diagnosis not present

## 2019-12-31 ENCOUNTER — Encounter: Payer: Self-pay | Admitting: Rehabilitation

## 2019-12-31 ENCOUNTER — Other Ambulatory Visit: Payer: Self-pay

## 2019-12-31 ENCOUNTER — Ambulatory Visit: Payer: Medicaid Other | Attending: Family Medicine | Admitting: Rehabilitation

## 2019-12-31 DIAGNOSIS — R2681 Unsteadiness on feet: Secondary | ICD-10-CM | POA: Diagnosis not present

## 2019-12-31 DIAGNOSIS — R293 Abnormal posture: Secondary | ICD-10-CM

## 2019-12-31 DIAGNOSIS — Z89512 Acquired absence of left leg below knee: Secondary | ICD-10-CM

## 2019-12-31 DIAGNOSIS — M21371 Foot drop, right foot: Secondary | ICD-10-CM

## 2019-12-31 DIAGNOSIS — M79604 Pain in right leg: Secondary | ICD-10-CM

## 2019-12-31 DIAGNOSIS — R2689 Other abnormalities of gait and mobility: Secondary | ICD-10-CM | POA: Diagnosis not present

## 2019-12-31 DIAGNOSIS — M6281 Muscle weakness (generalized): Secondary | ICD-10-CM | POA: Diagnosis not present

## 2019-12-31 DIAGNOSIS — M25661 Stiffness of right knee, not elsewhere classified: Secondary | ICD-10-CM

## 2019-12-31 DIAGNOSIS — Z9181 History of falling: Secondary | ICD-10-CM

## 2019-12-31 DIAGNOSIS — Z89431 Acquired absence of right foot: Secondary | ICD-10-CM

## 2019-12-31 DIAGNOSIS — M25652 Stiffness of left hip, not elsewhere classified: Secondary | ICD-10-CM

## 2019-12-31 DIAGNOSIS — M25651 Stiffness of right hip, not elsewhere classified: Secondary | ICD-10-CM

## 2019-12-31 DIAGNOSIS — M79605 Pain in left leg: Secondary | ICD-10-CM

## 2019-12-31 NOTE — Therapy (Addendum)
[facility] [address] [place], [place], [address] Phone: [phone]   Fax:  [phone]  Physical Therapy Treatment  Patient Details  Name: [name] MRN: [id number] Date of Birth: 09-Jan-1976 Referring Provider (PT): [name], MD   Encounter Date: 12/31/2019  PT End of Session - 12/31/19 1137    Visit Number  7    Number of Visits  21    Authorization Type  Medicaid    Authorization Time Period  3 visits from 1/18-01/13/20    Authorization - Visit Number  1    Authorization - Number of Visits  3    PT Start Time  1025   pt late to session   PT Stop Time  1105    PT Time Calculation (min)  40 min    Equipment Utilized During Treatment  Gait belt    Activity Tolerance  Patient tolerated treatment well;Patient limited by fatigue    Behavior During Therapy  [facility] for tasks assessed/performed       Past Medical History:  Diagnosis Date  . Acute osteomyelitis of ankle or foot, left ([place]) 01/31/2018  . Alveolar hypoventilation   . Anemia    not on iron pill  . Asthma   . Bipolar 2 disorder ([place])   . Carpal tunnel syndrome on right    recurrent  . Cellulitis 08/2010-08/2011  . Chronic pain   . COPD (chronic obstructive pulmonary disease) ([name])    Symbicort daily and Proventil as needed  . Costochondritis   . Depression   . Diabetes mellitus type II, uncontrolled ([place]) 2000   Type 2, Uncontrolled.Takes Lantus daily.Fasting blood sugar runs 150  . Dizziness    occasionally  . Drug-seeking behavior   . GERD (gastroesophageal reflux disease)    takes Pantoprazole and Zantac daily  . Headache    migraine-last one about a yr ago.Topamax daily  . HLD (hyperlipidemia)    takes Atorvastatin daily  . Hypertension    takes Lisinopril and Coreg daily  . Morbid obesity ([place])   . Muscle spasm    takes Flexeril as needed  . Nocturia   . OSA on CPAP   . Peripheral neuropathy    takes Gabapentin daily  .  Pneumonia    "walking" several yrs ago and as a baby (12/05/2018)  . Rectal fissure   . Restless leg   . Syncope 02/25/2016  . Urinary frequency   . Varicose veins    Right medial thigh and Left leg     Past Surgical History:  Procedure Laterality Date  . AMPUTATION Left 02/01/2018   Procedure: LEFT FOURTH AND 5TH TOE RAY AMPUTATION;  Surgeon: [name], MD;  Location: [facility];  Service: Orthopedics;  Laterality: Left;  . AMPUTATION Left 03/03/2018   Procedure: LEFT BELOW KNEE AMPUTATION;  Surgeon: [name], MD;  Location: [facility];  Service: Orthopedics;  Laterality: Left;  . CARPAL TUNNEL RELEASE Bilateral   . CESAREAN SECTION  2007  . INCISION AND DRAINAGE PERIRECTAL ABSCESS Left 05/18/2019   Procedure: IRRIGATION AND DEBRIDEMENT OF PANNIS ABSCESS, POSSIBLE DEBRIDEMENT OF BUTTOCK WOUND;  Surgeon: Donnie Mesa, MD;  Location: [facility];  Service: General;  Laterality: Left;  . IRRIGATION AND DEBRIDEMENT BUTTOCKS Left 05/17/2019   Procedure: IRRIGATION AND DEBRIDEMENT BUTTOCKS;  Surgeon: Donnie Mesa, MD;  Location: [facility];  Service: General;  Laterality: Left;  . KNEE ARTHROSCOPY Right 07/17/2010  . LEFT HEART CATHETERIZATION WITH CORONARY ANGIOGRAM N/A 07/27/2012  Procedure: LEFT HEART CATHETERIZATION WITH CORONARY ANGIOGRAM;  Surgeon: Sherren Mocha, MD;  Location: [facility] CATH LAB;  Service: Cardiovascular;  Laterality: N/A;  . MASS EXCISION N/A 06/29/2013   Procedure:  WIDE LOCAL EXCISION OF POSTERIOR NECK ABSCESS;  Surgeon: Ralene Ok, MD;  Location: [facility];  Service: General;  Laterality: N/A;  . REPAIR KNEE LIGAMENT Left    "fixed ligaments and chipped patella"  . right transmetatarsal amputation       There were no vitals filed for this visit.  Subjective Assessment - 12/31/19 1031    Subjective  Pt did go to ED last week but didn't stay due to near fall.  Needs to get appt with urologist.  Also got denied for AFO but needs a face to face with MD to get approved.     Pertinent History  Rt Transmetatarsal Amputation, LTTA, OA, COPD, asthma, uncontrolled DM2, depression, Neuropathy, Bipolar, carpal tunnel, morbid obesity, syncope    Limitations  Lifting;Standing;Walking;House hold activities    Patient Stated Goals  To walk in house & community    Currently in Pain?  Yes    Pain Score  6     Pain Location  Leg    Pain Orientation  Right;Left    Pain Descriptors / Indicators  Aching;Sore;Numbness    Pain Type  Chronic pain    Pain Onset  More than a month ago    Pain Frequency  Constant    Aggravating Factors   sitting up    Pain Relieving Factors  medication, getting into bed                       [facility] Adult PT Treatment/Exercise - 12/31/19 1034      Transfers   Transfers  Sit to Stand;Stand to Sit    Sit to Stand  5: Supervision;4: Min guard    Sit to Stand Details  Verbal cues for technique;Verbal cues for safe use of DME/AE    Sit to Stand Details (indicate cue type and reason)  Cues for hand placement and increased forward weight shift.     Stand to Sit  5: Supervision;With upper extremity assist;To chair/3-in-1;Other (comment)    Stand to Sit Details (indicate cue type and reason)  Verbal cues for sequencing;Verbal cues for safe use of DME/AE    Comments  x 5 reps during session.       Ambulation/Gait   Ambulation/Gait  Yes    Ambulation/Gait Assistance  4: Min guard    Ambulation/Gait Assistance Details  Pt ambulated x 2 bouts of approx 20' during session with L prosthesis and R reaction AFO.  Pt very fatigued and notes pain in RLE at graft site.  Cues for wider L step during gait as she tends to adduct leg to midline.      Ambulation Distance (Feet)  20 Feet   x 2   Assistive device  Rolling walker;Prosthesis;Other (Comment)    Gait Pattern  Step-to pattern;Decreased stride length;Decreased dorsiflexion - right;Decreased weight shift to left;Right foot flat;Antalgic;Lateral hip instability;Trunk flexed;Decreased step length  - right;Decreased stance time - left;Right flexed knee in stance;Left flexed knee in stance    Ambulation Surface  Level;Indoor      Prosthetics   Prosthetic Care Comments   PT instructed pt on how to assess and adjust ply socks throughout the day.  Note that she has on 5 ply sock during session and was only able to get single (maybe one other  click) once donned and in standing.  Did not have any other socks with her or in clinic to try.  She is to adjust at home.      Current prosthetic wear tolerance (days/week)   daily    Current prosthetic wear tolerance (#hours/day)   Reports she is wearing liner/prosthesis most of her awake hours    Current prosthetic weight-bearing tolerance (hours/day)   Very limited today due to pain, only 20' intervals    Edema  Mild edema noted     Residual limb condition   intact per PT assessment (both R and L limbs)    Education Provided  Residual limb care;Proper wear schedule/adjustment;Proper weight-bearing schedule/adjustment;Correct ply sock adjustment;Skin check    Person(s) Educated  Patient;Child(ren)    Education Method  Explanation;Demonstration;Verbal cues    Education Method  Verbalized understanding;Returned demonstration;Needs further instruction               PT Short Term Goals - 12/05/19 1338      PT SHORT TERM GOAL #1   Title  Patient verbalizes consistant wear of prosthesis for 6 hours/daily. (All updated STGs target date 01/07/2020)    Baseline  12/05/19: 4-6 hours daily, wears liner all wake hours    Time  3    Period  Weeks    Status  On-going    Target Date  01/07/20      PT SHORT TERM GOAL #2   Title  Patient tolerates 3 minutes of standing activities with UE support with vital signs WNL.    Baseline  Patient tolerates 1 minute of standing activities with UE support with supervision. Vital signs WNL but dyspnea.    Time  3    Period  Weeks    Status  On-going    Target Date  01/07/20      PT SHORT TERM GOAL #3   Title   Patient able to reach to floor with RW support with supervision.    Baseline  12/05/19: Patient reaches within 7.5" of floor with RW support with minA.    Time  3    Period  Weeks    Status  Revised    Target Date  01/07/20      PT SHORT TERM GOAL #4   Title  Patient ambulates 90' with RW & prosthesis and personal GrAFO with supervision.    Baseline  12/05/19: Patient ambulated 61' with RW & prosthesis & clinic's GrAFO with minA., longest distance during cert period = 40'    Time  3    Period  Weeks    Status  Revised    Target Date  01/07/20        PT Long Term Goals - 12/05/19 1343      PT LONG TERM GOAL #1   Title  Patient verbalizes & demonstrates proper orthotic care and maintains current mod-I prosthetic care ability to enable safe use of orthosis & prosthesis. (All LTGs Target Date 02/21/20)    Baseline  12/05/2019  Patient is scheduled to receive her first AFO on 12/11/2018 and is dependent in care / use.    Time  9    Period  Weeks    Status  Revised    Target Date  02/21/20      PT LONG TERM GOAL #2   Title  Patient tolerates prosthetic liner wear for >90% and prosthetic wear >50% of awake hours without skin integrity issues for improved mobility.    Baseline  12/05/19: wears liner most wake hours 8-10, wears prosthesis 4-6 hours daily. She is scheduled to receive AFO on 12/11/2018 so no wear yet.    Time  9    Period  Weeks    Status  Revised    Target Date  02/21/20      PT LONG TERM GOAL #3   Title  Patient reports pain increases </= 3 increments with standing & gait increased activities noted in LTGs with RW, prosthesis, and orthosis.    Baseline  12/05/19: Patient reports bilateral knee pain up to 8/10 with prosthesis wear, standing & gait with baseline at lowest 5/10    Time  13    Period  Weeks    Status  Revised    Target Date  02/21/20      PT LONG TERM GOAL #4   Title  Standing balance with RW support reaching 10" anteriorly, picking up objects from floor &  scanning environment safely modified independent.    Baseline  12/05/19: Standing balance with RW support with moderate assist reaching 10" anteriorly, pick up object on floor 7.5", able to look over bilateral shoulder with wt shift with supervision-minA    Time  13    Period  Weeks    Status  Revised    Target Date  02/21/20      PT LONG TERM GOAL #5   Title  Patient ambulates 51' with RW & prosthesis & orthosis modified independent to enable basic community entry.    Baseline  12/05/19: Patient ambulates 43' with RW & prosthesis & clinic's GrAFO with minA    Time  9    Period  Weeks    Status  Revised    Target Date  02/21/20      PT LONG TERM GOAL #6   Title  Patient negotiates ramp & curb with RW & prosthesis & orthosis minA from family to enable community access.    Baseline  12/05/19: Patient is non-ambulatory on stairs, ramps & curbs limiting community mobility.    Time  13    Period  Weeks    Status  Revised    Target Date  02/21/20            Plan - 12/31/19 1138    Clinical Impression Statement  Pt late to session but reporting increased pain in residual limb.  PT assessed R and L limbs during session with no skin breakdown or excess pressure areas noted.  Pt reports more pain when resting in bed vs better when dangles limb off EOB or walking.  Also note improper ply sock donning today with the need to decrease socks when she gets home.  Pt only able to tolerate 2, 20' bouts of gait today due to fatigue and pain.    Personal Factors and Comorbidities  Comorbidity 3+;Fitness;Past/Current Experience;Finances;Time since onset of injury/illness/exacerbation    Comorbidities  left Transtibial amputation, right Transmetatarsal amputation, OA, asthma, DM, depression, neuropathy, Bipolar    Examination-Activity Limitations  Bed Mobility;Caring for Others;Carry;Lift;Locomotion Level;Stairs;Stand;Transfers    Examination-Participation Restrictions  Community Activity;Driving;Meal  Prep;Other   parenting 42yo dtr   Stability/Clinical Decision Making  Evolving/Moderate complexity    Rehab Potential  Good    PT Frequency  2x / week   1x/wk for 3 weeks, then 2x/wk for 6 weeks   PT Duration  Other (comment)   9 weeks   PT Treatment/Interventions  ADLs/Self Care Home Management;DME Instruction;Gait training;Stair training;Functional mobility training;Therapeutic activities;Therapeutic exercise;Balance training;Neuromuscular re-education;Patient/family  education;Prosthetic Training;Orthotic Fit/Training;Vestibular    PT Next Visit Plan  Did she get in with PCP for face to face to [facility], did she see urologist/PCP for possible UTI??gait training with GrAFO and LLE prosthesis, modified stair/ramp/curb negotiation, w/c mobility over threshold, standing balance, Nustep for aerobic endurance    Consulted and Agree with Plan of Care  Patient       Patient will benefit from skilled therapeutic intervention in order to improve the following deficits and impairments:  Abnormal gait, Decreased activity tolerance, Decreased balance, Decreased endurance, Decreased knowledge of use of DME, Decreased mobility, Decreased range of motion, Decreased strength, Dizziness, Impaired flexibility, Postural dysfunction, Prosthetic Dependency, Obesity, Pain  Visit Diagnosis: Other abnormalities of gait and mobility  Unsteadiness on feet  Abnormal posture  Muscle weakness  Pain in right leg  Muscle weakness (generalized)     Problem List Patient Active Problem List   Diagnosis Date Noted  . UTI (urinary tract infection) 06/28/2019  . Right hand pain 06/23/2019  . Amputation above knee ([place]) 06/23/2019  . Left shoulder pain 06/23/2019  . Dysuria 06/14/2019  . Abdominal pain   . Fever   . Abscess of skin of abdomen   . Abscess of buttock, right 05/17/2019  . Hyperglycemia   . Severe protein-calorie malnutrition ([place])   . Amputated toe, right ([place]) 04/11/2019  . Left below-knee  amputee ([place]) 04/11/2019  . Hypertriglyceridemia 04/11/2019  . Type 2 diabetes mellitus with diabetic polyneuropathy, with long-term current use of insulin ([place]) 04/11/2019  . Type 2 diabetes mellitus with hyperglycemia, with long-term current use of insulin ([place]) 04/11/2019  . Infection of right foot   . Cellulitis of great toe of right foot 10/29/2018  . Osteomyelitis of great toe of right foot ([place]) 10/29/2018  . Encounter for orthopedic aftercare following surgical amputation 06/28/2018  . Uncontrolled type 2 diabetes mellitus with insulin therapy ([place]) 04/16/2018  . Allergic rhinitis 03/06/2018  . Unilateral complete BKA, left, sequela ([place])   . Class 3 severe obesity due to excess calories with serious comorbidity and body mass index (BMI) of 50.0 to 59.9 in adult [facility])   . AKI (acute kidney injury) ([place])   . Decreased pedal pulses 06/10/2017  . Unilateral primary osteoarthritis, right knee 10/22/2016  . Primary osteoarthritis of first carpometacarpal joint of left hand 07/30/2016  . Diabetic neuropathy ([place]) 07/14/2016  . Nausea 03/17/2016  . De Quervain's tenosynovitis, bilateral 11/01/2015  . Vitamin D deficiency 09/05/2015  . Recurrent candidiasis of vagina 09/05/2015  . Varicose veins of leg with complications [id number]  . Restless leg syndrome 10/17/2014  . Chronic sinusitis 07/18/2014  . Headache 07/15/2014  . Encounter for chronic pain management 06/30/2013  . HLD (hyperlipidemia) 11/19/2012  . Chest pain 06/27/2012  . Right carpal tunnel syndrome 09/01/2011  . Bilateral knee pain 09/01/2011  . DM (diabetes mellitus) type II uncontrolled, periph vascular disorder ([place]) 05/22/2008  . Morbid obesity ([place]) 05/22/2008  . 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 Depression 05/22/2008  . Obstructive sleep apnea 05/22/2008  . Hypertension 05/22/2008  . Asthma 05/22/2008  . GERD 05/22/2008    Cameron Sprang, PT, MPT [facility] [address] [place], [place], [address] Phone: [phone]   Fax:  [phone] 12/31/19, 11:43 AM  Name: [name] MRN: [id number] Date of Birth: 1976-10-13    PT Short Term Goals - 01/16/20 1508      PT SHORT TERM GOAL #1   Title  Patient verbalizes consistant  wear of prosthesis for 6 hours/daily. (All updated STGs target date 02/13/2020)    Baseline  12/05/19: 4-6 hours daily, wears liner all wake hours    Time  1    Period  Months    Status  On-going    Target Date  02/13/20      PT SHORT TERM GOAL #2   Title  Patient tolerates 3 minutes of standing activities with UE support with vital signs WNL.    Baseline  Patient tolerates 1 minute of standing activities with UE support with supervision. Vital signs WNL but dyspnea.    Time  1    Period  Months    Status  On-going    Target Date  02/13/20      PT SHORT TERM GOAL #3   Title  Patient able to reach to floor with RW support with supervision.    Baseline  12/05/19: Patient reaches within 7.5" of floor with RW support with minA.    Time  3    Period  Weeks    Status  On-going    Target Date  02/13/20      PT SHORT TERM GOAL #4   Title  Patient ambulates 63' with RW & prosthesis and personal GrAFO with supervision.    Baseline  12/05/19: Patient ambulated 42' with RW & prosthesis & clinic's GrAFO with minA., longest distance during cert period = 40'    Time  3    Period  Weeks    Status  On-going    Target Date  02/13/20      PT Long Term Goals - 01/16/20 1510      PT LONG TERM GOAL #1   Title  Patient verbalizes & demonstrates proper orthotic care and maintains current mod-I prosthetic care ability to enable safe use of orthosis & prosthesis. (All LTGs Target Date 04/11/2020)    Baseline  12/05/2019  Patient is scheduled to receive her first AFO on 12/11/2018 and is dependent in care / use.    Time  12    Period  Weeks    Status  On-going    Target Date  04/11/20      PT LONG TERM GOAL #2   Title   Patient tolerates prosthetic liner wear for >90% and prosthetic wear >50% of awake hours without skin integrity issues for improved mobility.    Baseline  12/05/19: wears liner most wake hours 8-10, wears prosthesis 4-6 hours daily. She is scheduled to receive AFO on 12/11/2018 so no wear yet.    Time  12    Period  Weeks    Status  On-going    Target Date  04/11/20      PT LONG TERM GOAL #3   Title  Patient reports pain increases </= 3 increments with standing & gait increased activities noted in LTGs with RW, prosthesis, and orthosis.    Baseline  12/05/19: Patient reports bilateral knee pain up to 8/10 with prosthesis wear, standing & gait with baseline at lowest 5/10    Time  13    Period  Weeks    Status  On-going    Target Date  04/11/20      PT LONG TERM GOAL #4   Title  Standing balance with RW support reaching 10" anteriorly, picking up objects from floor & scanning environment safely modified independent.    Baseline  12/05/19: Standing balance with RW support with moderate assist reaching 10" anteriorly, pick up object on  floor 7.5", able to look over bilateral shoulder with wt shift with supervision-minA    Time  13    Period  Weeks    Status  On-going    Target Date  04/11/20      PT LONG TERM GOAL #5   Title  Patient ambulates 51' with RW & prosthesis & orthosis modified independent to enable basic community entry.    Baseline  12/05/19: Patient ambulates 42' with RW & prosthesis & clinic's GrAFO with minA    Time  13    Period  Weeks    Status  On-going    Target Date  04/11/20      PT LONG TERM GOAL #6   Title  Patient negotiates ramp & curb with RW & prosthesis & orthosis minA from family to enable community access.    Baseline  12/05/19: Patient is non-ambulatory on stairs, ramps & curbs limiting community mobility.    Time  13    Period  Weeks    Status  On-going    Target Date  04/11/20      Clinical Impression Statement Patient has not been able to attend  but one PT session since recertification on [id number] due to moving and primary PT schedule availability. She does report getting out of bed more now which is first step in increasing her activity level / mobility.  PT recommended a right AFO due to instability caused by Transmetatarsal amputation resulting in knee flexion moments during stance & high fall risk / falls.  Medicaid has initially denied AFO requiring additional MD notes to justify it.  Patient needs additional skilled PT care / instruction to progress to her functional potential & improve safety. She would benefit from longer duration to maximize potential so PT is changing frequency & duration to better utilize 12 visits. New frequency & duration will be 1x/wk for 12 weeks.  Patient is in agreement with changes.  Personal Factors and Comorbidities Comorbidity 3+;Fitness;Past/Current Experience;Finances;Time since onset of injury/illness/exacerbation  Comorbidities left Transtibial amputation, right Transmetatarsal amputation, OA, asthma, DM, depression, neuropathy, Bipolar  Examination-Activity Limitations Bed Mobility;Caring for Others;Carry;Lift;Locomotion Level;Stairs;Stand;Transfers  Examination-Participation Restrictions Community Activity;Driving;Meal Prep;Other (parenting 42yo dtr)  Pt will benefit from skilled therapeutic intervention in order to improve on the following deficits Abnormal gait;Decreased activity tolerance;Decreased balance;Decreased endurance;Decreased knowledge of use of DME;Decreased mobility;Decreased range of motion;Decreased strength;Dizziness;Impaired flexibility;Postural dysfunction;Prosthetic Dependency;Obesity;Pain  Stability/Clinical Decision Making Evolving/Moderate complexity  Rehab Potential Good  PT Frequency 1x / week (1x/wk for 3 weeks, then 2x/wk for 6 weeks)  PT Duration 12 weeks  PT Treatment/Interventions ADLs/Self Care Home Management;DME Instruction;Gait training;Stair training;Functional  mobility training;Therapeutic activities;Therapeutic exercise;Balance training;Neuromuscular re-education;Patient/family education;Prosthetic Training;Orthotic Fit/Training;Vestibular  PT Next Visit Plan Did she get in with PCP for face to face to [facility], did she see urologist/PCP for possible UTI??gait training with GrAFO and LLE prosthesis, modified stair/ramp/curb negotiation, w/c mobility over threshold, standing balance, Nustep for aerobic endurance  Consulted and Agree with Plan of Care Patient    Jamey Reas, PT, DPT PT Specializing in [place] 01/16/20 3:23 PM Phone:  [phone]  Fax:  [phone] [place] [address] [place], [place] [address]

## 2020-01-01 ENCOUNTER — Emergency Department (HOSPITAL_COMMUNITY)
Admission: EM | Admit: 2020-01-01 | Discharge: 2020-01-01 | Disposition: A | Discharge status: Home / Self Care | Payer: Medicaid Other | Attending: Emergency Medicine | Admitting: Emergency Medicine

## 2020-01-01 ENCOUNTER — Emergency Department (HOSPITAL_COMMUNITY): Payer: Medicaid Other

## 2020-01-01 ENCOUNTER — Emergency Department (HOSPITAL_BASED_OUTPATIENT_CLINIC_OR_DEPARTMENT_OTHER): Payer: Medicaid Other

## 2020-01-01 DIAGNOSIS — Z87891 Personal history of nicotine dependence: Secondary | ICD-10-CM | POA: Diagnosis not present

## 2020-01-01 DIAGNOSIS — Z794 Long term (current) use of insulin: Secondary | ICD-10-CM | POA: Insufficient documentation

## 2020-01-01 DIAGNOSIS — R279 Unspecified lack of coordination: Secondary | ICD-10-CM | POA: Diagnosis not present

## 2020-01-01 DIAGNOSIS — R609 Edema, unspecified: Secondary | ICD-10-CM

## 2020-01-01 DIAGNOSIS — Z89512 Acquired absence of left leg below knee: Secondary | ICD-10-CM | POA: Diagnosis not present

## 2020-01-01 DIAGNOSIS — M7989 Other specified soft tissue disorders: Secondary | ICD-10-CM

## 2020-01-01 DIAGNOSIS — E1142 Type 2 diabetes mellitus with diabetic polyneuropathy: Secondary | ICD-10-CM | POA: Insufficient documentation

## 2020-01-01 DIAGNOSIS — N390 Urinary tract infection, site not specified: Secondary | ICD-10-CM

## 2020-01-01 DIAGNOSIS — J45909 Unspecified asthma, uncomplicated: Secondary | ICD-10-CM | POA: Diagnosis not present

## 2020-01-01 DIAGNOSIS — Z743 Need for continuous supervision: Secondary | ICD-10-CM | POA: Diagnosis not present

## 2020-01-01 DIAGNOSIS — E1140 Type 2 diabetes mellitus with diabetic neuropathy, unspecified: Secondary | ICD-10-CM | POA: Diagnosis not present

## 2020-01-01 DIAGNOSIS — F29 Unspecified psychosis not due to a substance or known physiological condition: Secondary | ICD-10-CM | POA: Diagnosis not present

## 2020-01-01 DIAGNOSIS — R52 Pain, unspecified: Secondary | ICD-10-CM | POA: Diagnosis not present

## 2020-01-01 DIAGNOSIS — M79604 Pain in right leg: Secondary | ICD-10-CM | POA: Diagnosis not present

## 2020-01-01 DIAGNOSIS — R739 Hyperglycemia, unspecified: Secondary | ICD-10-CM | POA: Diagnosis not present

## 2020-01-01 DIAGNOSIS — Z79899 Other long term (current) drug therapy: Secondary | ICD-10-CM | POA: Insufficient documentation

## 2020-01-01 DIAGNOSIS — E1165 Type 2 diabetes mellitus with hyperglycemia: Secondary | ICD-10-CM | POA: Diagnosis not present

## 2020-01-01 LAB — COMPREHENSIVE METABOLIC PANEL
ALT: 14 U/L (ref 0–44)
AST: 14 U/L — ABNORMAL LOW (ref 15–41)
Albumin: 2.7 g/dL — ABNORMAL LOW (ref 3.5–5.0)
Alkaline Phosphatase: 177 U/L — ABNORMAL HIGH (ref 38–126)
Anion gap: 12 (ref 5–15)
BUN: 25 mg/dL — ABNORMAL HIGH (ref 6–20)
CO2: 23 mmol/L (ref 22–32)
Calcium: 9.3 mg/dL (ref 8.9–10.3)
Chloride: 95 mmol/L — ABNORMAL LOW (ref 98–111)
Creatinine, Ser: 0.98 mg/dL (ref 0.44–1.00)
GFR calc Af Amer: >60 mL/min (ref >60)
GFR calc non Af Amer: >60 mL/min (ref >60)
Glucose, Bld: 610 mg/dL (ref 70–99)
Potassium: 4.3 mmol/L (ref 3.5–5.1)
Sodium: 130 mmol/L — ABNORMAL LOW (ref 135–145)
Total Bilirubin: 0.4 mg/dL (ref 0.3–1.2)
Total Protein: 7.3 g/dL (ref 6.5–8.1)

## 2020-01-01 LAB — I-STAT BETA HCG BLOOD, ED (MC, WL, AP ONLY): I-stat hCG, quantitative: <5 m[IU]/mL (ref <5)

## 2020-01-01 LAB — CBC WITH DIFFERENTIAL/PLATELET
Abs Immature Granulocytes: 0.14 10*3/uL — ABNORMAL HIGH (ref 0.00–0.07)
Basophils Absolute: 0.1 10*3/uL (ref 0.0–0.1)
Basophils Relative: 1 %
Eosinophils Absolute: 0.3 10*3/uL (ref 0.0–0.5)
Eosinophils Relative: 3 %
HCT: 40 % (ref 36.0–46.0)
Hemoglobin: 12.9 g/dL (ref 12.0–15.0)
Immature Granulocytes: 1 %
Lymphocytes Relative: 43 %
Lymphs Abs: 4.1 10*3/uL — ABNORMAL HIGH (ref 0.7–4.0)
MCH: 29.7 pg (ref 26.0–34.0)
MCHC: 32.3 g/dL (ref 30.0–36.0)
MCV: 92.2 fL (ref 80.0–100.0)
Monocytes Absolute: 0.7 10*3/uL (ref 0.1–1.0)
Monocytes Relative: 7 %
Neutro Abs: 4.4 10*3/uL (ref 1.7–7.7)
Neutrophils Relative %: 45 %
Platelets: 466 10*3/uL — ABNORMAL HIGH (ref 150–400)
RBC: 4.34 MIL/uL (ref 3.87–5.11)
RDW: 12.7 % (ref 11.5–15.5)
WBC: 9.7 10*3/uL (ref 4.0–10.5)
nRBC: 0 % (ref 0.0–0.2)

## 2020-01-01 LAB — URINALYSIS, ROUTINE W REFLEX MICROSCOPIC
Bilirubin Urine: NEGATIVE
Glucose, UA: >=500 mg/dL — AB
Ketones, ur: NEGATIVE mg/dL
Nitrite: NEGATIVE
Protein, ur: NEGATIVE mg/dL
Specific Gravity, Urine: 1.024 (ref 1.005–1.030)
WBC, UA: >50 WBC/hpf — ABNORMAL HIGH (ref 0–5)
pH: 6 (ref 5.0–8.0)

## 2020-01-01 LAB — CBG MONITORING, ED
Glucose-Capillary: 543 mg/dL (ref 70–99)
Glucose-Capillary: 475 mg/dL — ABNORMAL HIGH (ref 70–99)

## 2020-01-01 LAB — LACTIC ACID, PLASMA
Lactic Acid, Venous: 2.4 mmol/L (ref 0.5–1.9)
Lactic Acid, Venous: 2.1 mmol/L (ref 0.5–1.9)

## 2020-01-01 MED ORDER — SODIUM CHLORIDE 0.9% FLUSH
3.0000 mL | Freq: Once | INTRAVENOUS | Status: AC
  Administered 2020-01-01: 3 mL via INTRAVENOUS

## 2020-01-01 MED ORDER — HYDROMORPHONE HCL 1 MG/ML IJ SOLN
1.0000 mg | Freq: Once | INTRAMUSCULAR | Status: AC
  Administered 2020-01-01: 1 mg via INTRAVENOUS
  Filled 2020-01-01: qty 1

## 2020-01-01 MED ORDER — SODIUM CHLORIDE 0.9 % IV BOLUS
1000.0000 mL | Freq: Once | INTRAVENOUS | Status: AC
  Administered 2020-01-01: 1000 mL via INTRAVENOUS

## 2020-01-01 MED ORDER — INSULIN ASPART 100 UNIT/ML ~~LOC~~ SOLN
10.0000 [IU] | Freq: Once | SUBCUTANEOUS | Status: AC
  Administered 2020-01-01: 10 [IU] via SUBCUTANEOUS

## 2020-01-01 MED ORDER — NITROFURANTOIN MONOHYD MACRO 100 MG PO CAPS: 100.0000 mg | ORAL_CAPSULE | Freq: Two times a day (BID) | ORAL | 0 refills | Status: DC

## 2020-01-01 NOTE — ED Triage Notes (Addendum)
Pt arrived via [facility] d/t leg pain caused by her neuropathy that pt states got so bad tonight she was in tears & could not sleep. Pt has her toes amputated on her Rt foot & a BKA on the Lt. Pt A/Ox4 upon arrival & rated her bilateral leg pain 8/10.

## 2020-01-01 NOTE — ED Notes (Signed)
[facility] has been contacted for pt's D/C transport to home.

## 2020-01-01 NOTE — ED Notes (Signed)
Help patient on the bedpan patient is now off got patient some ice water change patient sheets patient is resting with call bee in reach

## 2020-01-01 NOTE — Progress Notes (Signed)
Lower extremity venous has been completed.   Preliminary results in CV Proc.   Abram Sander 01/01/2020 8:09 AM

## 2020-01-01 NOTE — ED Notes (Signed)
EDP aware of pt's 610 CBG & 2.4 lactic acid.

## 2020-01-01 NOTE — ED Provider Notes (Signed)
[facility] EMERGENCY DEPARTMENT Provider Note   CSN: [id number] Arrival date & time: 01/01/20  0701     History Chief Complaint  Patient presents with  . Hyperglycemia    [name] is a 44 y.o. female.  She has a history of poorly controlled diabetes, osteomyelitis status post left BKA.  She is presenting by ambulance for evaluation of right leg pain.  She said she has baseline neuropathy but this is worse.  The leg pain is severe in nature and is throughout the entire leg but worse at the knee.  No trauma.  No fevers.  EMS found sugars critically elevated.  Patient states her blood sugars usually run higher to 300s.  Denies any fever chest pain shortness of breath vomiting diarrhea or urinary symptoms.  The history is provided by the patient.  Hyperglycemia Blood sugar level PTA:  High Severity:  Severe Onset quality:  Unable to specify Timing:  Sporadic Progression:  Unchanged Chronicity:  Recurrent Diabetes status:  Controlled with insulin Relieved by:  None tried Ineffective treatments:  None tried Associated symptoms: no abdominal pain, no blurred vision, no chest pain, no confusion, no dysuria, no fever, no shortness of breath, no syncope and no vomiting   Risk factors: obesity        Past Medical History:  Diagnosis Date  . Acute osteomyelitis of ankle or foot, left ([place]) 01/31/2018  . Alveolar hypoventilation   . Anemia    not on iron pill  . Asthma   . Bipolar 2 disorder ([place])   . Carpal tunnel syndrome on right    recurrent  . Cellulitis 08/2010-08/2011  . Chronic pain   . COPD (chronic obstructive pulmonary disease) ([name])    Symbicort daily and Proventil as needed  . Costochondritis   . Depression   . Diabetes mellitus type II, uncontrolled ([place]) 2000   Type 2, Uncontrolled.Takes Lantus daily.Fasting blood sugar runs 150  . Dizziness    occasionally  . Drug-seeking behavior   . GERD (gastroesophageal reflux disease)    takes  Pantoprazole and Zantac daily  . Headache    migraine-last one about a yr ago.Topamax daily  . HLD (hyperlipidemia)    takes Atorvastatin daily  . Hypertension    takes Lisinopril and Coreg daily  . Morbid obesity ([place])   . Muscle spasm    takes Flexeril as needed  . Nocturia   . OSA on CPAP   . Peripheral neuropathy    takes Gabapentin daily  . Pneumonia    "walking" several yrs ago and as a baby (12/05/2018)  . Rectal fissure   . Restless leg   . Syncope 02/25/2016  . Urinary frequency   . Varicose veins    Right medial thigh and Left leg     Patient Active Problem List   Diagnosis Date Noted  . UTI (urinary tract infection) 06/28/2019  . Right hand pain 06/23/2019  . Amputation above knee ([place]) 06/23/2019  . Left shoulder pain 06/23/2019  . Dysuria 06/14/2019  . Abdominal pain   . Fever   . Abscess of skin of abdomen   . Abscess of buttock, right 05/17/2019  . Hyperglycemia   . Severe protein-calorie malnutrition ([place])   . Amputated toe, right ([place]) 04/11/2019  . 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[place]) 04/11/2019  . Hypertriglyceridemia 04/11/2019  . Type 2 diabetes mellitus with diabetic polyneuropathy, with long-term current use of insulin ([place]) 04/11/2019  . Type 2 diabetes mellitus with hyperglycemia, with long-term  current use of insulin ([place]) 04/11/2019  . Infection of right foot   . Cellulitis of great toe of right foot 10/29/2018  . Osteomyelitis of great toe of right foot ([place]) 10/29/2018  . Encounter for orthopedic aftercare following surgical amputation 06/28/2018  . Uncontrolled type 2 diabetes mellitus with insulin therapy ([place]) 04/16/2018  . Allergic rhinitis 03/06/2018  . Unilateral complete BKA, left, sequela ([place])   . Class 3 severe obesity due to excess calories with serious comorbidity and body mass index (BMI) of 50.0 to 59.9 in adult [facility])   . AKI (acute kidney injury) ([place])   . Decreased pedal pulses 06/10/2017  . Unilateral primary osteoarthritis,  right knee 10/22/2016  . Primary osteoarthritis of first carpometacarpal joint of left hand 07/30/2016  . Diabetic neuropathy ([place]) 07/14/2016  . Nausea 03/17/2016  . De Quervain's tenosynovitis, bilateral 11/01/2015  . Vitamin D deficiency 09/05/2015  . Recurrent candidiasis of vagina 09/05/2015  . Varicose veins of leg with complications 01/60/1093  . Restless leg syndrome 10/17/2014  . Chronic sinusitis 07/18/2014  . Headache 07/15/2014  . Encounter for chronic pain management 06/30/2013  . HLD (hyperlipidemia) 11/19/2012  . Chest pain 06/27/2012  . Right carpal tunnel syndrome 09/01/2011  . Bilateral knee pain 09/01/2011  . DM (diabetes mellitus) type II uncontrolled, periph vascular disorder ([place]) 05/22/2008  . Morbid obesity ([place]) 05/22/2008  . 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 Depression 05/22/2008  . Obstructive sleep apnea 05/22/2008  . Hypertension 05/22/2008  . Asthma 05/22/2008  . GERD 05/22/2008    Past Surgical History:  Procedure Laterality Date  . AMPUTATION Left 02/01/2018   Procedure: LEFT FOURTH AND 5TH TOE RAY AMPUTATION;  Surgeon: [name], MD;  Location: [facility];  Service: Orthopedics;  Laterality: Left;  . AMPUTATION Left 03/03/2018   Procedure: LEFT BELOW KNEE AMPUTATION;  Surgeon: [name], MD;  Location: [facility];  Service: Orthopedics;  Laterality: Left;  . CARPAL TUNNEL RELEASE Bilateral   . CESAREAN SECTION  2007  . INCISION AND DRAINAGE PERIRECTAL ABSCESS Left 05/18/2019   Procedure: IRRIGATION AND DEBRIDEMENT OF PANNIS ABSCESS, POSSIBLE DEBRIDEMENT OF BUTTOCK WOUND;  Surgeon: Donnie Mesa, MD;  Location: [facility];  Service: General;  Laterality: Left;  . IRRIGATION AND DEBRIDEMENT BUTTOCKS Left 05/17/2019   Procedure: IRRIGATION AND DEBRIDEMENT BUTTOCKS;  Surgeon: Donnie Mesa, MD;  Location: [facility];  Service: General;  Laterality: Left;  . KNEE ARTHROSCOPY Right 07/17/2010  . LEFT HEART CATHETERIZATION WITH CORONARY ANGIOGRAM N/A  07/27/2012   Procedure: LEFT HEART CATHETERIZATION WITH CORONARY ANGIOGRAM;  Surgeon: Sherren Mocha, MD;  Location: [facility] CATH LAB;  Service: Cardiovascular;  Laterality: N/A;  . MASS EXCISION N/A 06/29/2013   Procedure:  WIDE LOCAL EXCISION OF POSTERIOR NECK ABSCESS;  Surgeon: Ralene Ok, MD;  Location: [facility];  Service: General;  Laterality: N/A;  . REPAIR KNEE LIGAMENT Left    "fixed ligaments and chipped patella"  . right transmetatarsal amputation        OB History    Gravida  2   Para  1   Term      Preterm      AB  1   Living  1     SAB  1   TAB      Ectopic      Multiple      Live Births              Family History  Problem Relation Age of Onset  .  Diabetes Mother   . Hyperlipidemia Mother   . Depression Mother   . Varicose Veins Mother   . Heart attack Paternal Uncle   . Heart disease Paternal Grandmother   . Heart attack Paternal Grandmother   . Heart attack Paternal Grandfather   . Heart disease Paternal Grandfather   . Heart attack Father   . Cancer Maternal Grandmother        COLON  . Hypertension Maternal Grandmother   . Hyperlipidemia Maternal Grandmother   . Diabetes Maternal Grandmother   . Other Maternal Grandfather        GUN SHOT    Social History   Tobacco Use  . Smoking status: Former Smoker    Packs/day: 0.30    Years: 0.30    Pack years: 0.09    Types: Cigarettes    Quit date: 12/06/1993    Years since quitting: 26.0  . Smokeless tobacco: Never Used  Substance Use Topics  . Alcohol use: No    Alcohol/week: 0.0 standard drinks  . Drug use: Yes    Comment: OD attempts on home meds      Home Medications Prior to Admission medications   Medication Sig Start Date End Date Taking? Authorizing Provider  Accu-Chek Softclix Lancets lancets Use as instructed to check blood sugar 3 times per day 12/11/19   Shamleffer, Melanie Crazier, MD  amLODipine (NORVASC) 10 MG tablet Take 1 tablet (10 mg total) by mouth daily. 05/25/19    [name], MD  atorvastatin (LIPITOR) 20 MG tablet Take 1 tablet (20 mg total) by mouth daily. 03/16/18   Angiulli, Lavon Paganini, PA-C  Blood Glucose Monitoring Suppl (ACCU-CHEK NANO SMARTVIEW) w/Device KIT Use to check blood sugar 3 times daily 12/11/19   Shamleffer, Melanie Crazier, MD  budesonide-formoterol [facility]) 160-4.5 MCG/ACT inhaler inhale 2 PUFFS into THE lungs 2 TIMES DAILY 07/11/19   [name], MD  Cholecalciferol 1000 units tablet Take 1 tablet (1,000 Units total) by mouth daily. 04/05/17   [name], MD  cyclobenzaprine (FLEXERIL) 5 MG tablet TAKE ONE TABLET BY MOUTH THREE TIMES A DAY. AS NEEDED FOR MUSCLE SPASMS 12/10/19   [name], MD  gabapentin (NEURONTIN) 600 MG tablet Take 2 tablets (1,200 mg total) by mouth 3 (three) times daily. 12/10/19   [name], MD  glucose blood (ACCU-CHEK GUIDE) test strip USE T0 CHECK BLOOD SUGAR 3 TIMES DAILY 12/11/19   Shamleffer, Melanie Crazier, MD  HYDROcodone-acetaminophen (NORCO) 10-325 MG tablet Take 1 tablet by mouth every 8 (eight) hours as needed. 11/28/19   [name], MD  HYDROcodone-acetaminophen (NORCO) 10-325 MG tablet Take 1 tablet by mouth every 8 (eight) hours as needed. 11/28/19   [name], MD  HYDROcodone-acetaminophen (NORCO) 10-325 MG tablet Take 1 tablet by mouth every 8 (eight) hours as needed. 11/28/19   [name], MD  Insulin Isophane & Regular Human (HUMULIN 70/30 MIX) (70-30) 100 UNIT/ML PEN Inject 70 Units into the skin daily with breakfast AND 35 Units daily with supper. 04/11/19   Shamleffer, Melanie Crazier, MD  pantoprazole (PROTONIX) 40 MG tablet Take 1 tablet (40 mg total) by mouth daily. 03/16/18   Angiulli, Lavon Paganini, PA-C  PROVENTIL HFA 108 (90 Base) MCG/ACT inhaler Inhale 1-2 puffs into the lungs every 6 (six) hours as needed for wheezing or shortness of breath. Patient taking differently: Inhale 2 puffs into the lungs every 4 (four) hours as needed for  wheezing.  05/11/19   [name]  J, MD  Semaglutide,0.25 or 0.5MG/DOS, (OZEMPIC, 0.25 OR 0.5 MG/DOSE,) 2 MG/1.5ML SOPN Inject 0.5 mg into the skin once a week. 08/15/19   Shamleffer, Melanie Crazier, MD    Allergies    Kiwi extract, Cefepime, Trental [pentoxifylline], and Nubain [nalbuphine hcl]  Review of Systems   Review of Systems  Constitutional: Negative for fever.  HENT: Negative for sore throat.   Eyes: Negative for blurred vision and visual disturbance.  Respiratory: Negative for shortness of breath.   Cardiovascular: Negative for chest pain and syncope.  Gastrointestinal: Negative for abdominal pain and vomiting.  Genitourinary: Negative for dysuria.  Musculoskeletal: Positive for gait problem.  Skin: Negative for rash.  Neurological: Negative for headaches.  Psychiatric/Behavioral: Negative for confusion.    Physical Exam Updated Vital Signs BP (!) 123/93 (BP Location: Left Arm)   Pulse 91   Temp 98.1 F (36.7 C) (Oral)   Resp 20   SpO2 95%   Physical Exam Vitals and nursing note reviewed.  Constitutional:      General: She is not in acute distress.    Appearance: She is well-developed. She is obese.  HENT:     Head: Normocephalic and atraumatic.  Eyes:     Conjunctiva/sclera: Conjunctivae normal.  Cardiovascular:     Rate and Rhythm: Normal rate and regular rhythm.     Heart sounds: No murmur.  Pulmonary:     Effort: Pulmonary effort is normal. No respiratory distress.     Breath sounds: Normal breath sounds.  Abdominal:     Palpations: Abdomen is soft.     Tenderness: There is no abdominal tenderness.  Musculoskeletal:        General: Tenderness present.     Cervical back: Neck supple.     Right lower leg: Edema present.     Comments: She has a left BKA.  She has amputations of the toes on her right foot.  Right leg edematous with diffuse tenderness worse at the knee.  No cords appreciated.  No overlying erythema.  Skin:    General: Skin is warm  and dry.     Capillary Refill: Capillary refill takes less than 2 seconds.  Neurological:     General: No focal deficit present.     Mental Status: She is alert.     ED Results / Procedures / Treatments   Labs (all labs ordered are listed, but only abnormal results are displayed) Labs Reviewed  LACTIC ACID, PLASMA - Abnormal; Notable for the following components:      Result Value   Lactic Acid, Venous 2.4 (*)    All other components within normal limits  LACTIC ACID, PLASMA - Abnormal; Notable for the following components:   Lactic Acid, Venous 2.1 (*)    All other components within normal limits  COMPREHENSIVE METABOLIC PANEL - Abnormal; Notable for the following components:   Sodium 130 (*)    Chloride 95 (*)    Glucose, Bld 610 (*)    BUN 25 (*)    Albumin 2.7 (*)    AST 14 (*)    Alkaline Phosphatase 177 (*)    All other components within normal limits  CBC WITH DIFFERENTIAL/PLATELET - Abnormal; Notable for the following components:   Platelets 466 (*)    Lymphs Abs 4.1 (*)    Abs Immature Granulocytes 0.14 (*)    All other components within normal limits  URINALYSIS, ROUTINE W REFLEX MICROSCOPIC - Abnormal; Notable for the following components:   Color, Urine STRAW (*)  APPearance HAZY (*)    Glucose, UA >=500 (*)    Hgb urine dipstick SMALL (*)    Leukocytes,Ua MODERATE (*)    WBC, UA >50 (*)    Bacteria, UA RARE (*)    All other components within normal limits  CBG MONITORING, ED - Abnormal; Notable for the following components:   Glucose-Capillary 543 (*)    All other components within normal limits  CBG MONITORING, ED - Abnormal; Notable for the following components:   Glucose-Capillary 475 (*)    All other components within normal limits  URINE CULTURE  I-STAT BETA HCG BLOOD, ED ([facility], [facility], [facility] ONLY)  I-STAT BETA HCG BLOOD, ED ([facility], [facility], [facility] ONLY)  CBG MONITORING, ED    EKG None  Radiology DG Chest Port 1 View  Result Date: 01/01/2020 CLINICAL DATA:   Hyperglycemia. EXAM: PORTABLE CHEST 1 VIEW COMPARISON:  Chest radiograph 05/13/2019 FINDINGS: Heart size within normal limits. Shallow inspiration radiograph with crowding of the central bronchovascular markings. No evidence of airspace disease. No evidence of pleural effusion, although the left lateral costophrenic angle is partially excluded from the field of view. No pneumothorax. No acute bony abnormality. IMPRESSION: Shallow inspiration radiograph. No evidence of acute cardiopulmonary abnormality. Electronically Signed   By: Kellie Simmering DO   On: 01/01/2020 07:53   VAS [place] LOWER EXTREMITY VENOUS (DVT) (ONLY [facility] & [facility] 7a-7p)  Result Date: 01/01/2020  Lower Venous Study Indications: Edema, and Swelling.  Limitations: Body habitus, poor ultrasound/tissue interface and pain tolerance. Comparison Study: 06/16/15 previous Performing Technologist: Abram Sander RVS  Examination Guidelines: A complete evaluation includes B-mode imaging, spectral Doppler, color Doppler, and power Doppler as needed of all accessible portions of each vessel. Bilateral testing is considered an integral part of a complete examination. Limited examinations for reoccurring indications may be performed as noted.  +---------+---------------+---------+-----------+----------+-------------------+ RIGHT    CompressibilityPhasicitySpontaneityPropertiesThrombus Aging      +---------+---------------+---------+-----------+----------+-------------------+ CFV                     Yes      Yes                  not visualized well                                                       due to pannus       +---------+---------------+---------+-----------+----------+-------------------+ SFJ                                                   not visualized due                                                        to pannus           +---------+---------------+---------+-----------+----------+-------------------+ FV Prox                  Yes      Yes                                      +---------+---------------+---------+-----------+----------+-------------------+  FV Mid   Full           Yes      Yes                                      +---------+---------------+---------+-----------+----------+-------------------+ FV Distal                                             Not visualized      +---------+---------------+---------+-----------+----------+-------------------+ PFV                                                   Not visualized      +---------+---------------+---------+-----------+----------+-------------------+ POP      Full           Yes      Yes                                      +---------+---------------+---------+-----------+----------+-------------------+ PTV                                                   Not visualized      +---------+---------------+---------+-----------+----------+-------------------+ PERO                                                  Not visualized      +---------+---------------+---------+-----------+----------+-------------------+     Summary: Right: There is no evidence of deep vein thrombosis in the lower extremity. However, portions of this examination were limited- see technologist comments above. No cystic structure found in the popliteal fossa.  *See table(s) above for measurements and observations. Electronically signed by Deitra Mayo MD on 01/01/2020 at 8:14:43 AM.    Final     Procedures Procedures (including critical care time)  Medications Ordered in ED Medications  sodium chloride flush (NS) 0.9 % injection 3 mL (3 mLs Intravenous Given 01/01/20 0732)  HYDROmorphone (DILAUDID) injection 1 mg (1 mg Intravenous Given 01/01/20 0736)  sodium chloride 0.9 % bolus 1,000 mL (0 mLs Intravenous Stopped 01/01/20 0927)  insulin aspart (novoLOG) injection 10 Units (10 Units Subcutaneous Given 01/01/20 0830)    ED Course  I have  reviewed the triage vital signs and the nursing notes.  Pertinent labs & imaging results that were available during my care of the patient were reviewed by me and considered in my medical decision making (see chart for details).  Clinical Course as of Jan 01 1732  Tue Jan 26, 785  [id number] 44 year old female with poorly controlled diabetes here with right leg swelling and pain.  History of chronic pain and neuropathy.  Differential includes acute on chronic pain, DVT, cellulitis, metabolic derangement.  Sugars were critical high on transport.  Will get labs, give some pain medicine IV fluids, get a duplex of lower extremity.   [  MB]  [phone] Lower extremity duplex in the right is negative for DVT.  Patient's chemistry showing blood glucose of 610 although normal.  Alk phos elevated although has been before.  She is getting some IV fluids for mildly elevated lactate of 2.4.  Ordered some subcu insulin.   [MB]    Clinical Course User Index [MB] Hayden Rasmussen, MD   MDM Rules/Calculators/A&P                       Final Clinical Impression(s) / ED Diagnoses Final diagnoses:  Right leg pain  Hyperglycemia  Lower urinary tract infectious disease    Rx / DC Orders ED Discharge Orders         Ordered    nitrofurantoin, macrocrystal-monohydrate, (MACROBID) 100 MG capsule  2 times daily     01/01/20 1049           Hayden Rasmussen, MD 01/01/20 1734

## 2020-01-01 NOTE — Discharge Instructions (Signed)
You were seen in the emergency department for worsening of your right leg pain.  You had an ultrasound that did not show any evidence of a DVT.  Your blood work showed that your sugar was very high and that you also had a possible urinary tract infection.  You were given some fluids and insulin and we are sending you home with a prescription for an antibiotic.  Follow-up with your doctor and return the emergency department if any worsening symptoms.

## 2020-01-01 NOTE — ED Notes (Signed)
EDP is aware of the pts 543 CBG.

## 2020-01-02 IMAGING — DX DG FOOT COMPLETE 3+V*R*
3 series · 3 of 3 positions shown · non-contrast
Comparison: None.

CLINICAL DATA: Right distal foot pain

EXAM:
RIGHT FOOT COMPLETE - 3+ VIEW

[foot ap]
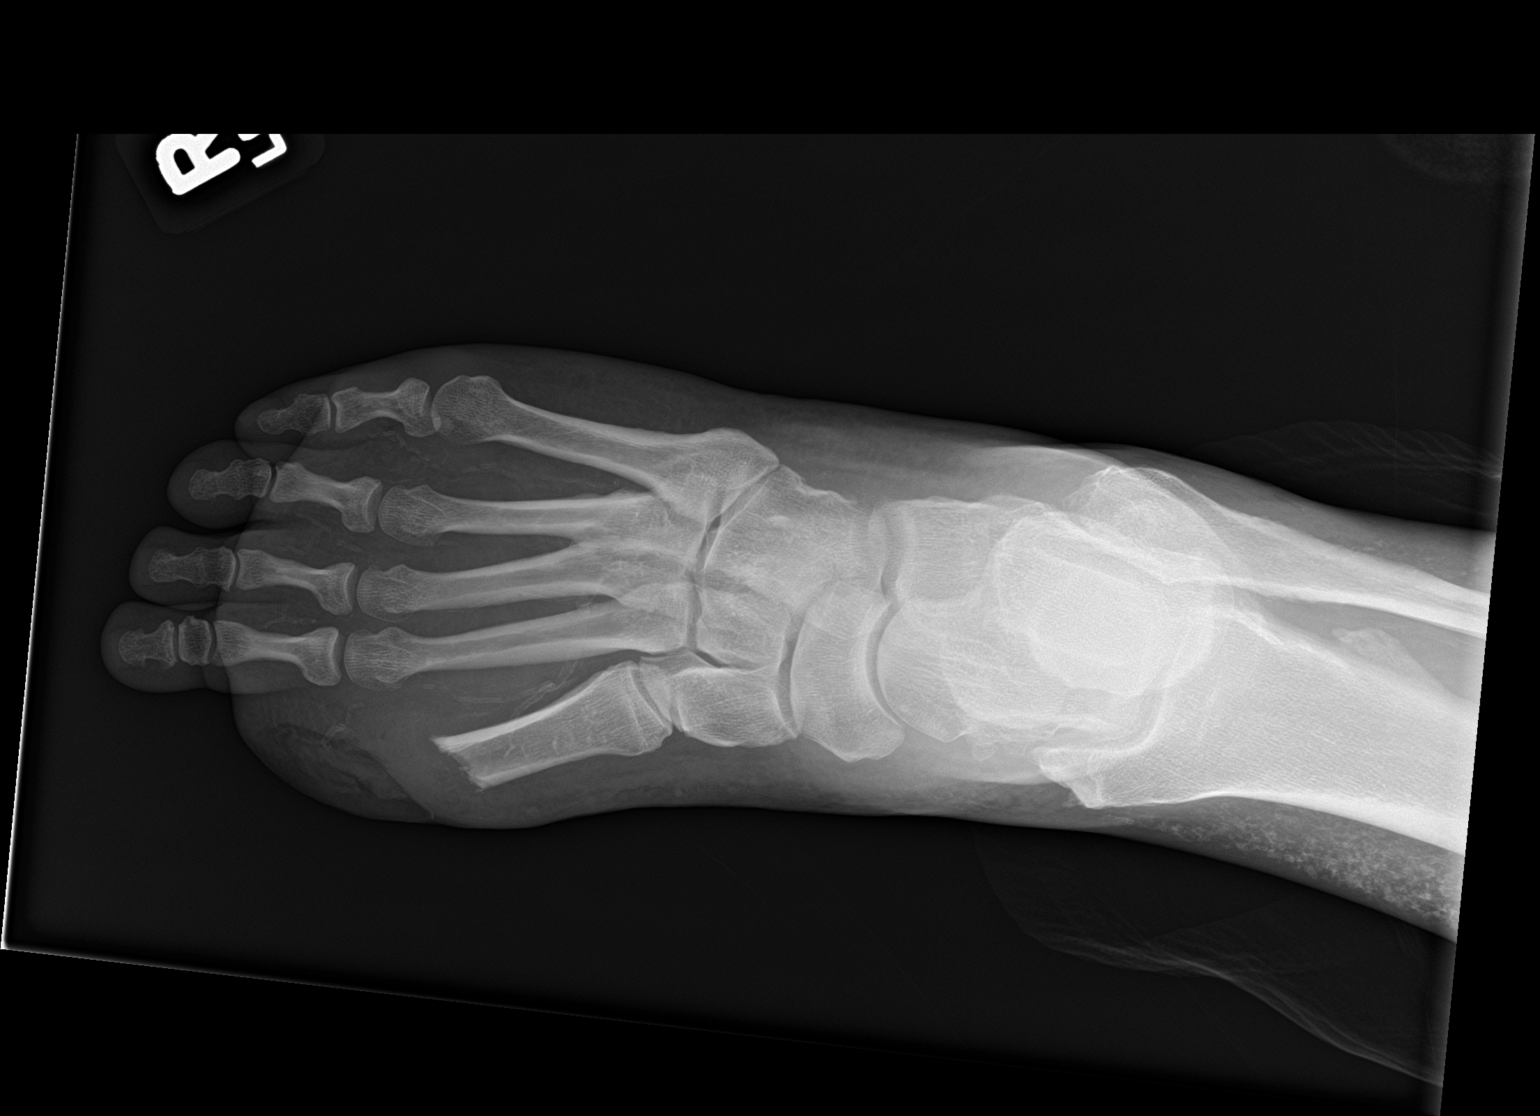

[foot obl]
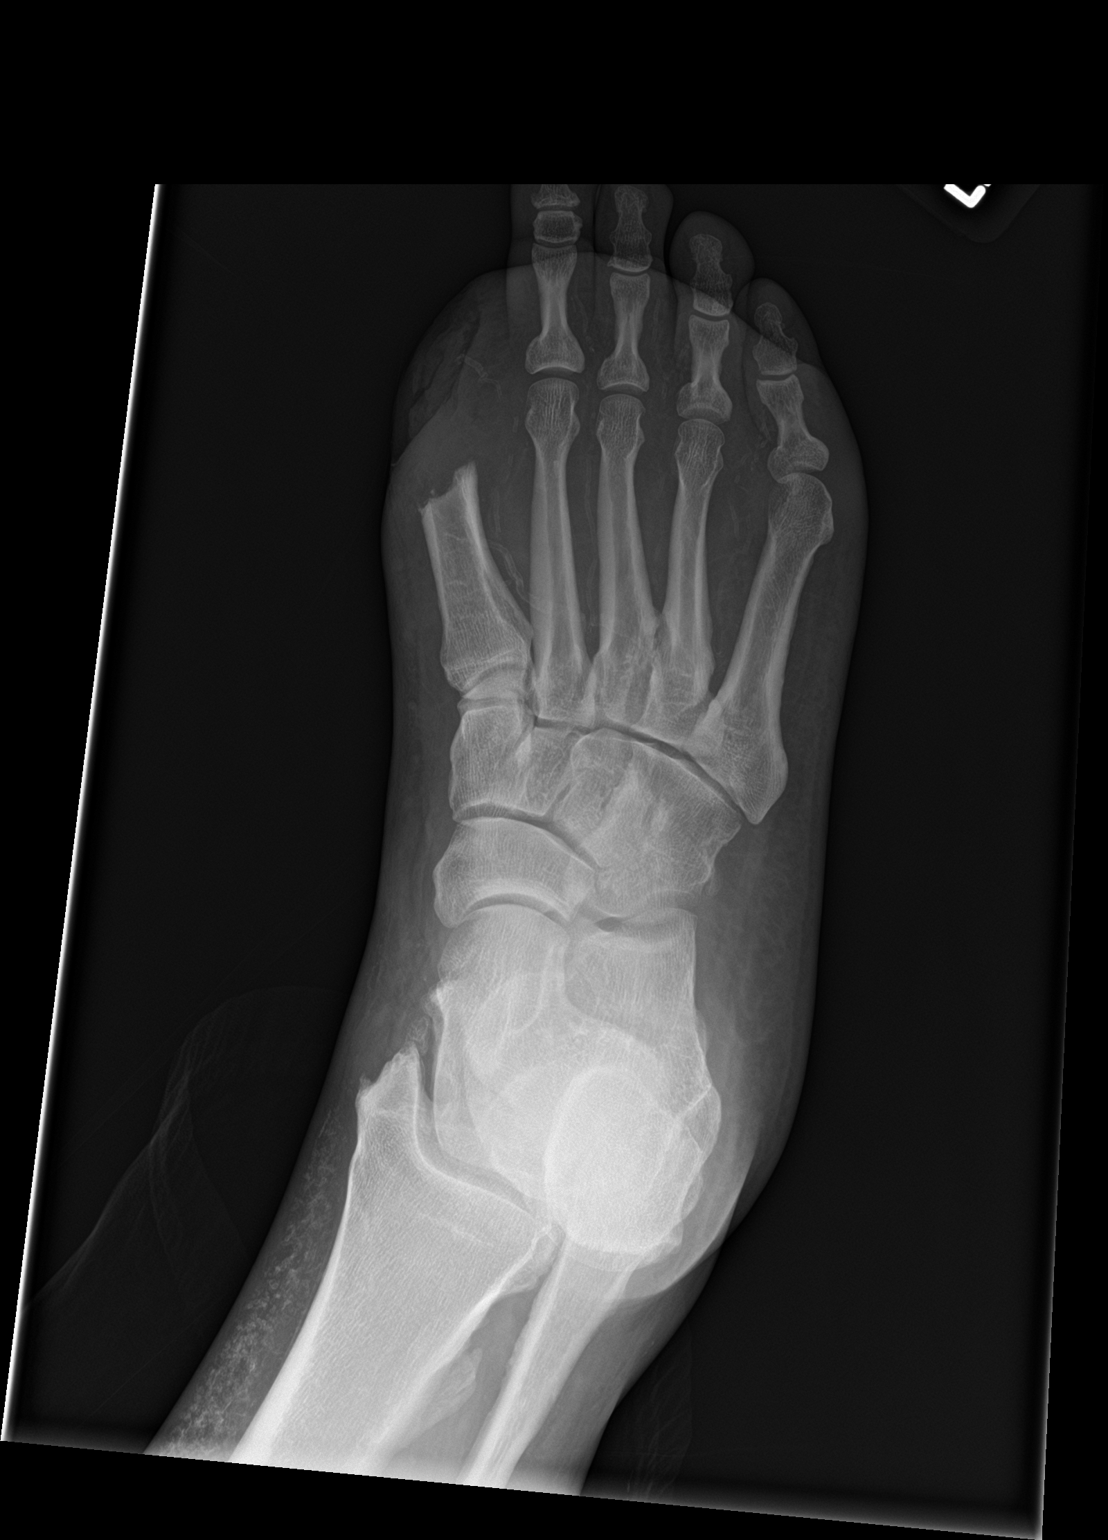

[foot lat]
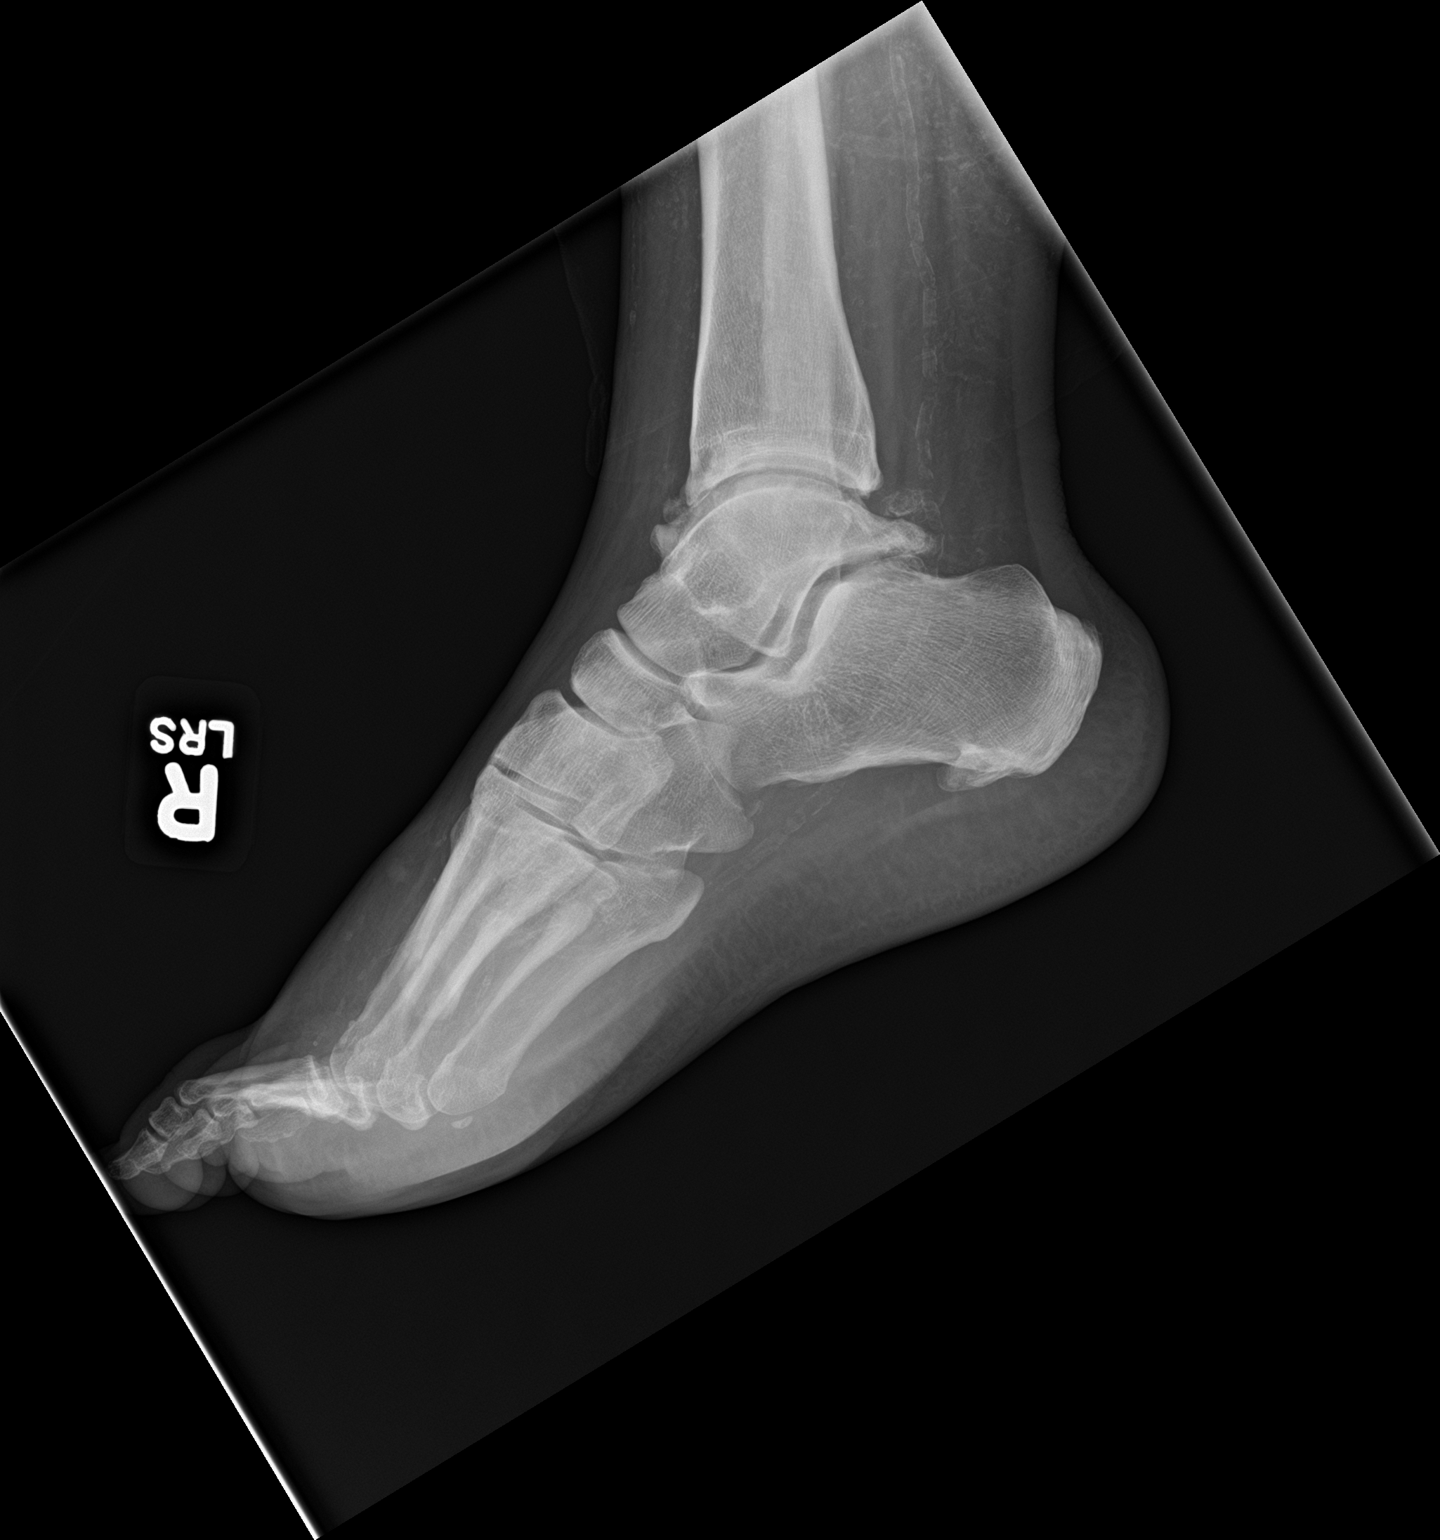

[3 of 3 positions shown; findings below may reference images not displayed]

FINDINGS: Status post right big toe amputation at the mid metatarsal. Soft
tissue defect present distally. No osteolysis. No soft tissue gas.
The other bones are normal. Extensive vascular calcification.
IMPRESSION: Status post right great toe amputation without radiographic evidence
of osteomyelitis.

## 2020-01-03 DIAGNOSIS — E1140 Type 2 diabetes mellitus with diabetic neuropathy, unspecified: Secondary | ICD-10-CM | POA: Diagnosis not present

## 2020-01-04 ENCOUNTER — Ambulatory Visit: Payer: Medicaid Other | Admitting: Physical Therapy

## 2020-01-04 DIAGNOSIS — E1140 Type 2 diabetes mellitus with diabetic neuropathy, unspecified: Secondary | ICD-10-CM | POA: Diagnosis not present

## 2020-01-04 LAB — URINE CULTURE: Culture: >=100000 — AB

## 2020-01-05 ENCOUNTER — Telehealth: Payer: Self-pay | Admitting: Emergency Medicine

## 2020-01-05 DIAGNOSIS — E1140 Type 2 diabetes mellitus with diabetic neuropathy, unspecified: Secondary | ICD-10-CM | POA: Diagnosis not present

## 2020-01-05 NOTE — Telephone Encounter (Signed)
Post ED Visit - Positive Culture Follow-up  Culture report reviewed by antimicrobial stewardship pharmacist: [facility] Team []  Elenor Quinones, Pharm.D. []  Heide Guile, Pharm.D., BCPS AQ-ID []  Parks Neptune, Pharm.D., BCPS []  Alycia Rossetti, Pharm.D., BCPS []  [place], Pharm.D., BCPS, AAHIVP []  Legrand Como, Pharm.D., BCPS, AAHIVP []  Salome Arnt, PharmD, BCPS []  Johnnette Gourd, PharmD, BCPS []  [name], PharmD, BCPS [x]  Duanne Limerick, PharmD []  Laqueta Linden, PharmD, BCPS []  Albertina Parr, PharmD  [place] Team []  Leodis Sias, PharmD []  Lindell Spar, PharmD []  Royetta Asal, PharmD []  Graylin Shiver, Rph []  Rema Fendt) Glennon Mac, PharmD []  Arlyn Dunning, PharmD []  Netta Cedars, PharmD []  Dia Sitter, PharmD []  Leone Haven, PharmD []  Gretta Arab, PharmD []  Theodis Shove, PharmD []  Peggyann Juba, PharmD []  Reuel Boom, PharmD   Positive urine culture Treated with Nitrofurantoin, organism sensitive to the same and no further patient follow-up is required at this time.  Sandi Raveling Lisel Siegrist 01/05/2020, 4:28 PM

## 2020-01-06 DIAGNOSIS — E1140 Type 2 diabetes mellitus with diabetic neuropathy, unspecified: Secondary | ICD-10-CM | POA: Diagnosis not present

## 2020-01-07 ENCOUNTER — Encounter: Payer: Medicaid Other | Admitting: Physical Therapy

## 2020-01-07 DIAGNOSIS — E1140 Type 2 diabetes mellitus with diabetic neuropathy, unspecified: Secondary | ICD-10-CM | POA: Diagnosis not present

## 2020-01-08 ENCOUNTER — Other Ambulatory Visit: Payer: Self-pay | Admitting: Family Medicine

## 2020-01-08 DIAGNOSIS — E1140 Type 2 diabetes mellitus with diabetic neuropathy, unspecified: Secondary | ICD-10-CM | POA: Diagnosis not present

## 2020-01-09 DIAGNOSIS — E1140 Type 2 diabetes mellitus with diabetic neuropathy, unspecified: Secondary | ICD-10-CM | POA: Diagnosis not present

## 2020-01-10 DIAGNOSIS — E1140 Type 2 diabetes mellitus with diabetic neuropathy, unspecified: Secondary | ICD-10-CM | POA: Diagnosis not present

## 2020-01-10 IMAGING — DX DG FOOT COMPLETE 3+V*R*
3 series · 3 of 3 positions shown · non-contrast
Comparison: RIGHT foot radiograph November 23, 2018

CLINICAL DATA: Fell today, struck stump.

EXAM:
RIGHT FOOT COMPLETE - 3+ VIEW

[foot ap]
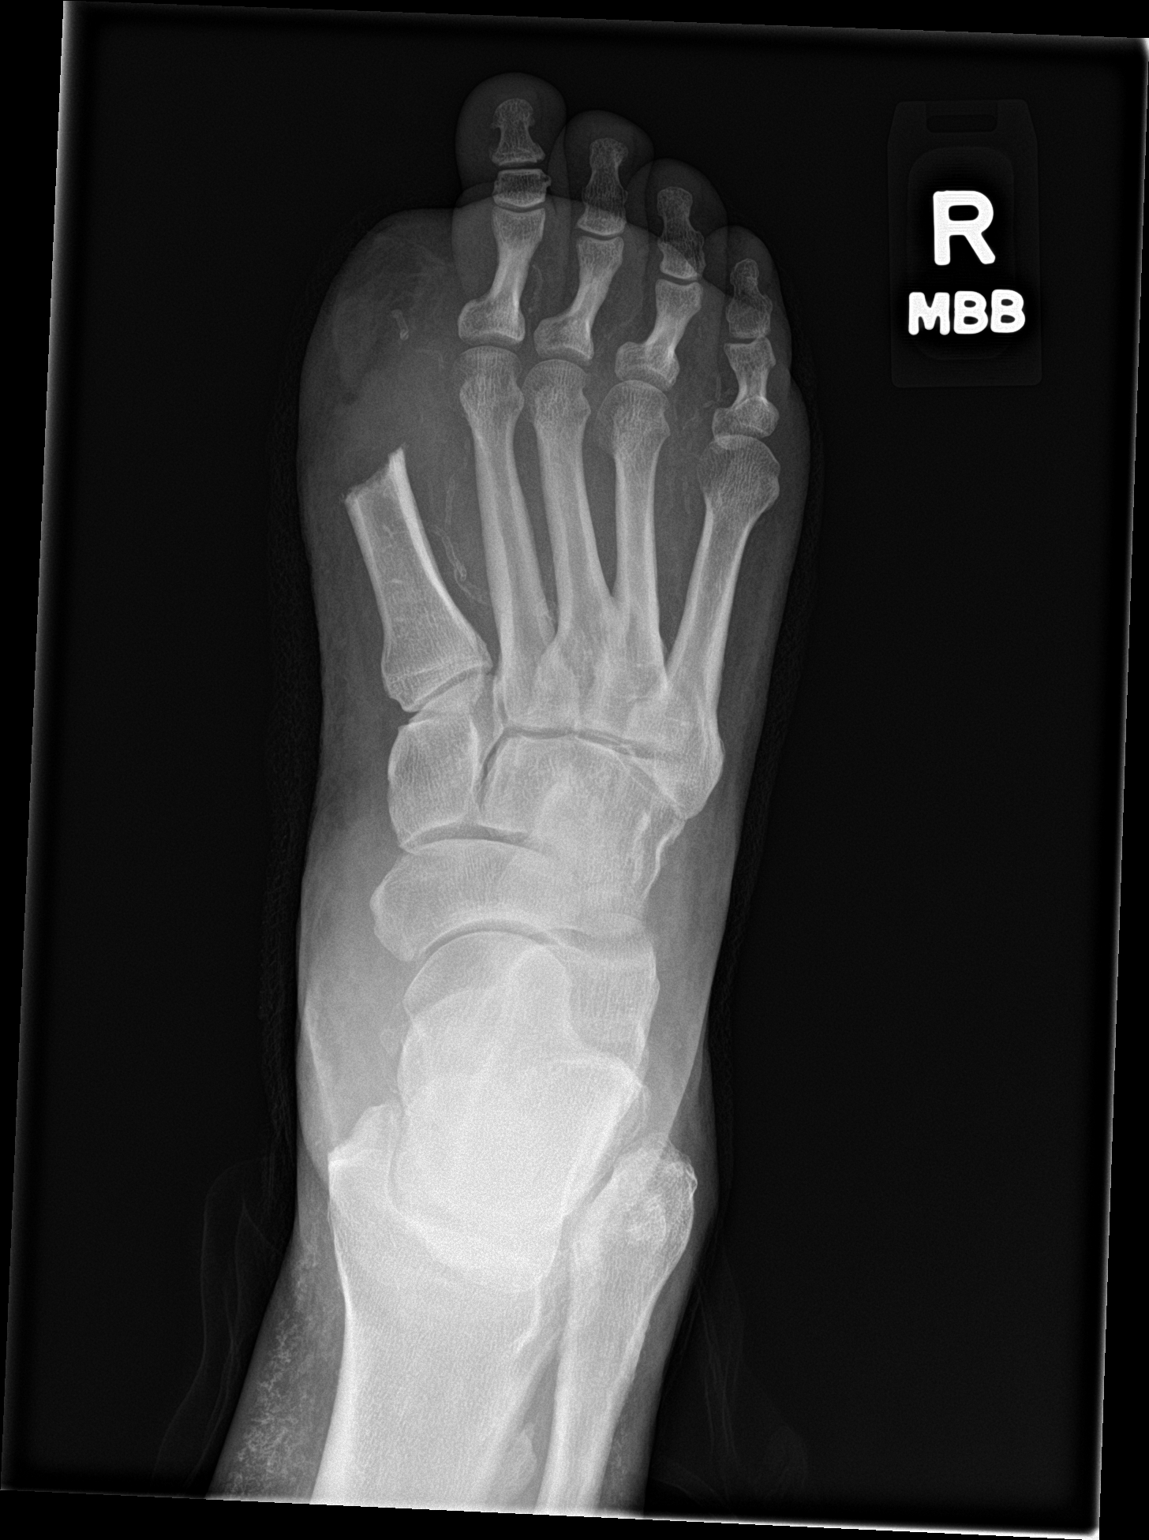

[foot obl]
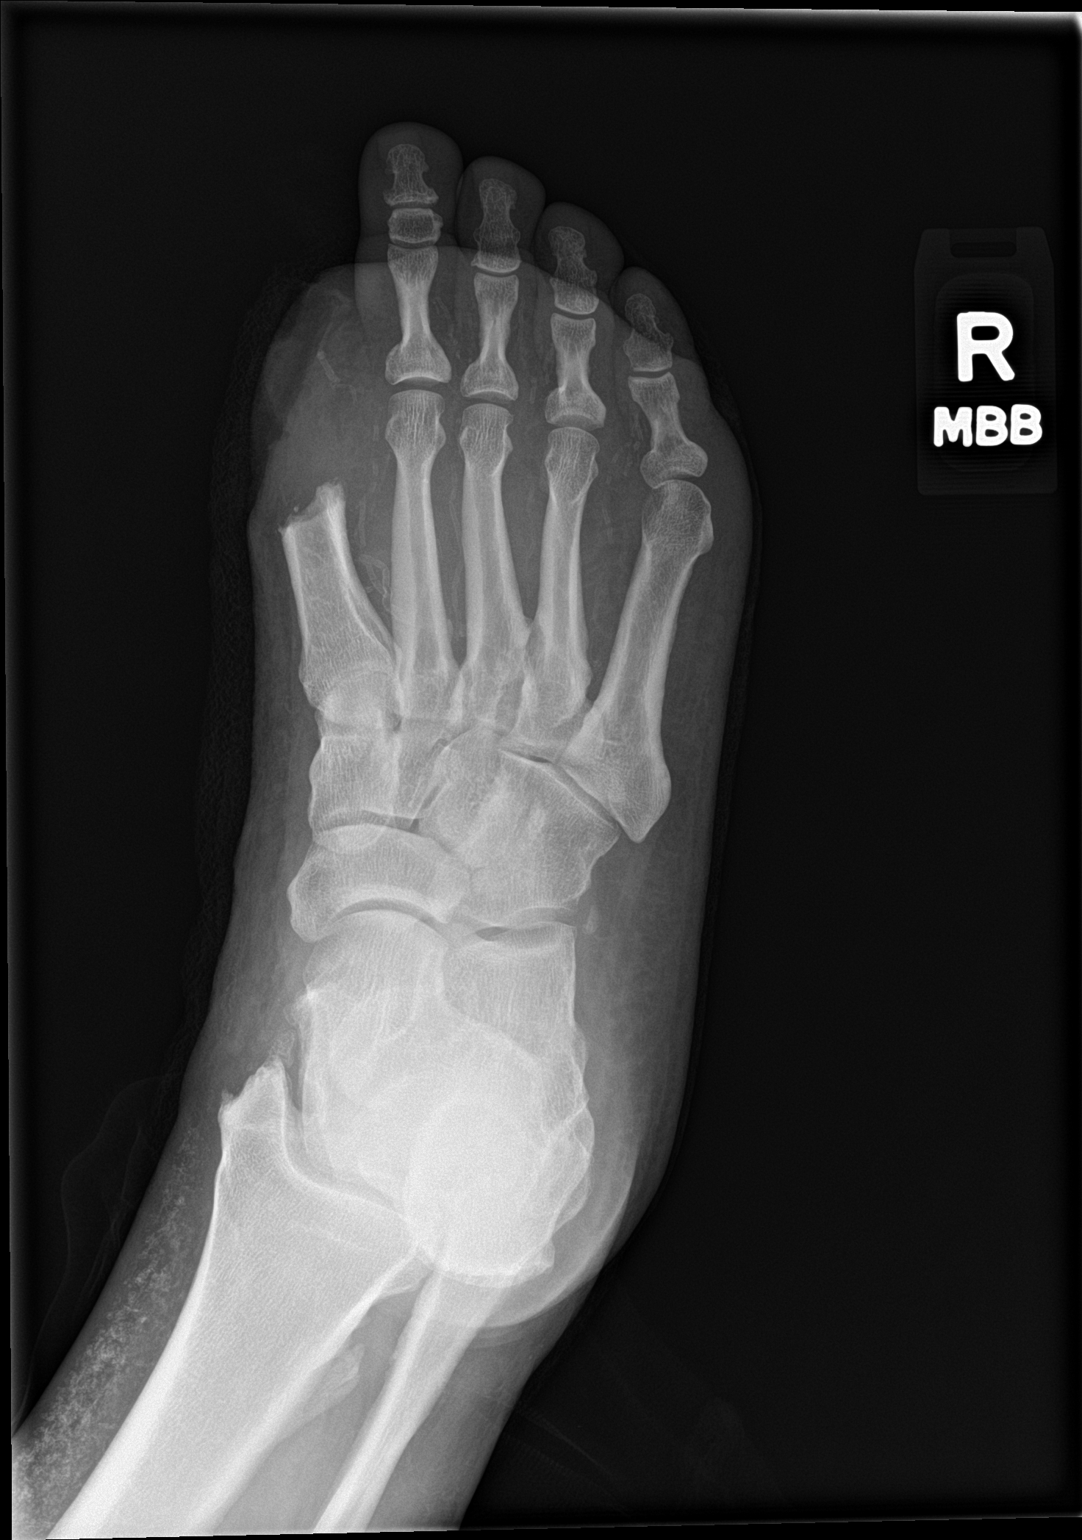

[foot lat]
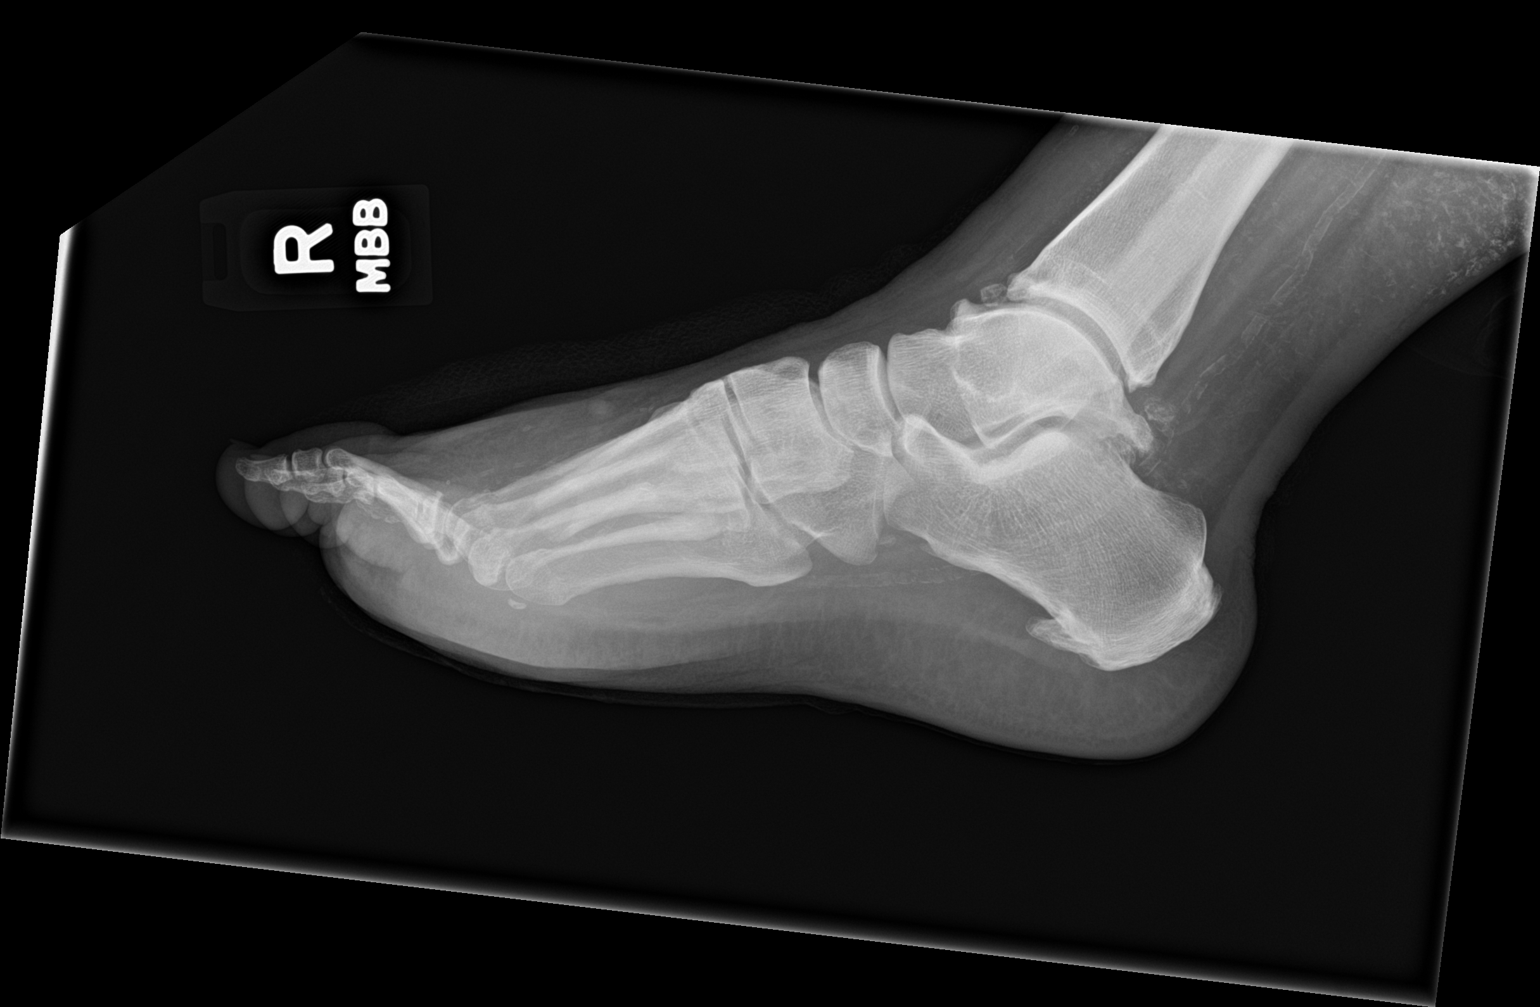

[3 of 3 positions shown; findings below may reference images not displayed]

FINDINGS: Status post RIGHT first transmetatarsal amputation, similar
appearance. No fracture deformity. No dislocation. Large plantar
calcaneal spur. No destructive bony lesions. Advanced vascular
calcifications with sheet like calcifications in included tibia and
fibula soft tissues.
IMPRESSION: 1. No acute fracture deformity or dislocation.
2. Status post first transmetatarsal amputation with stable
appearance, no radiographic findings of osteomyelitis.

## 2020-01-11 DIAGNOSIS — E1140 Type 2 diabetes mellitus with diabetic neuropathy, unspecified: Secondary | ICD-10-CM | POA: Diagnosis not present

## 2020-01-12 DIAGNOSIS — E1140 Type 2 diabetes mellitus with diabetic neuropathy, unspecified: Secondary | ICD-10-CM | POA: Diagnosis not present

## 2020-01-13 DIAGNOSIS — E1140 Type 2 diabetes mellitus with diabetic neuropathy, unspecified: Secondary | ICD-10-CM | POA: Diagnosis not present

## 2020-01-14 IMAGING — DX DG FOOT COMPLETE 3+V*R*
3 series · 3 of 3 positions shown · non-contrast
Comparison: Right foot radiographs performed 12/01/2018

CLINICAL DATA: Acute onset of right foot pain. Recent fall. Status
post right great toe amputation. Initial encounter.

EXAM:
RIGHT FOOT COMPLETE - 3+ VIEW

[foot ap]
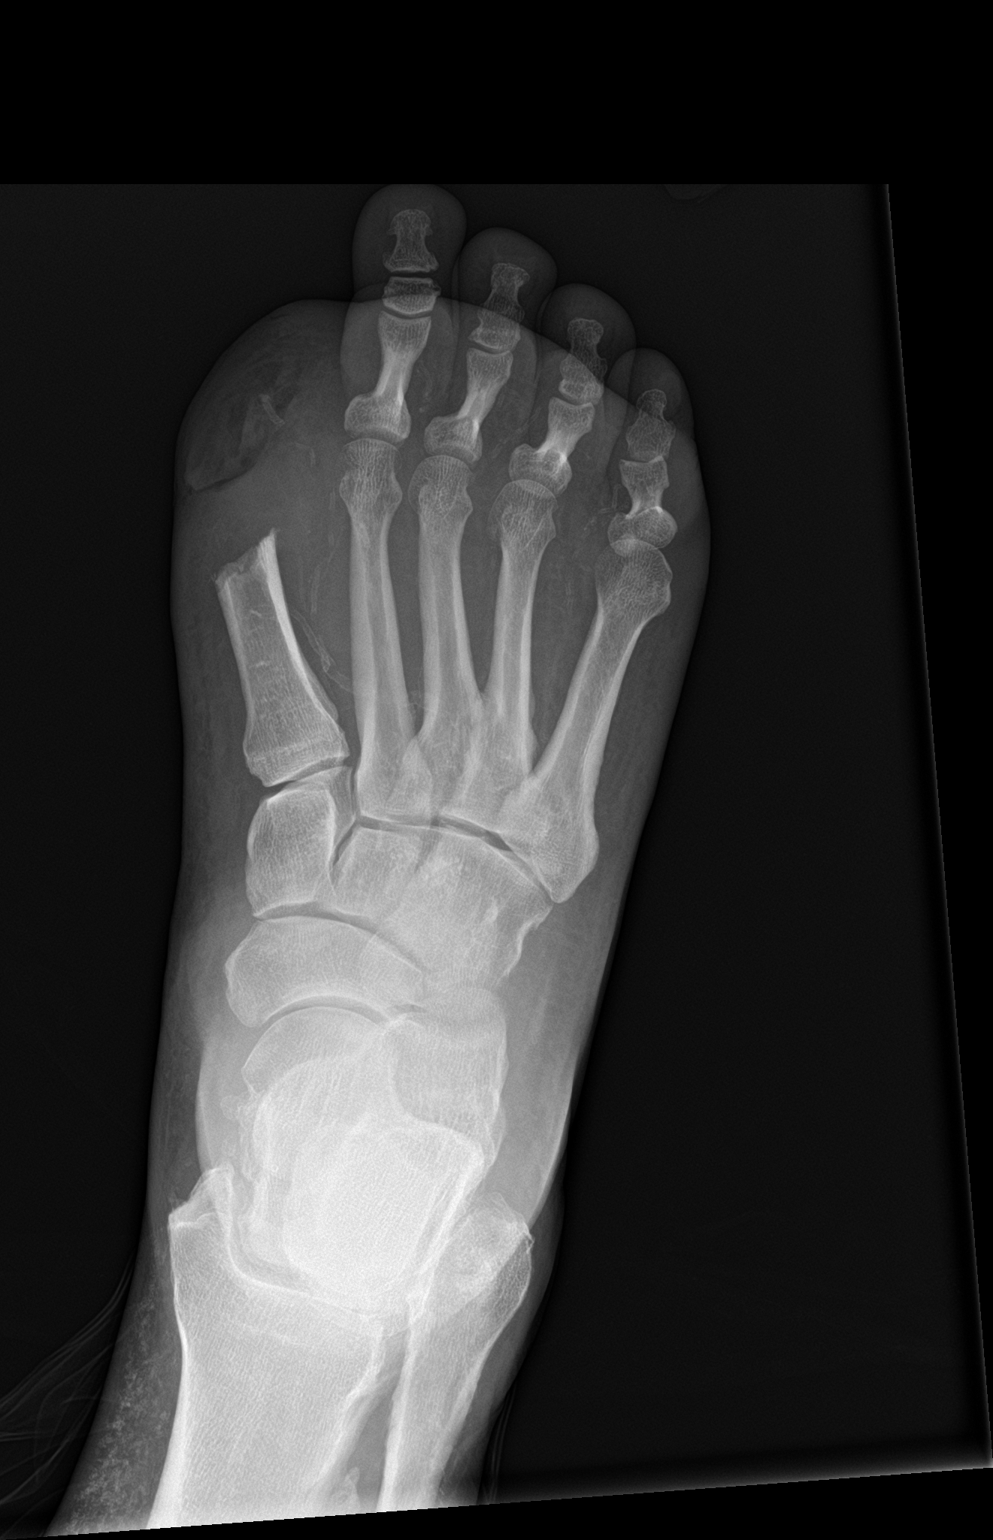

[foot obl]
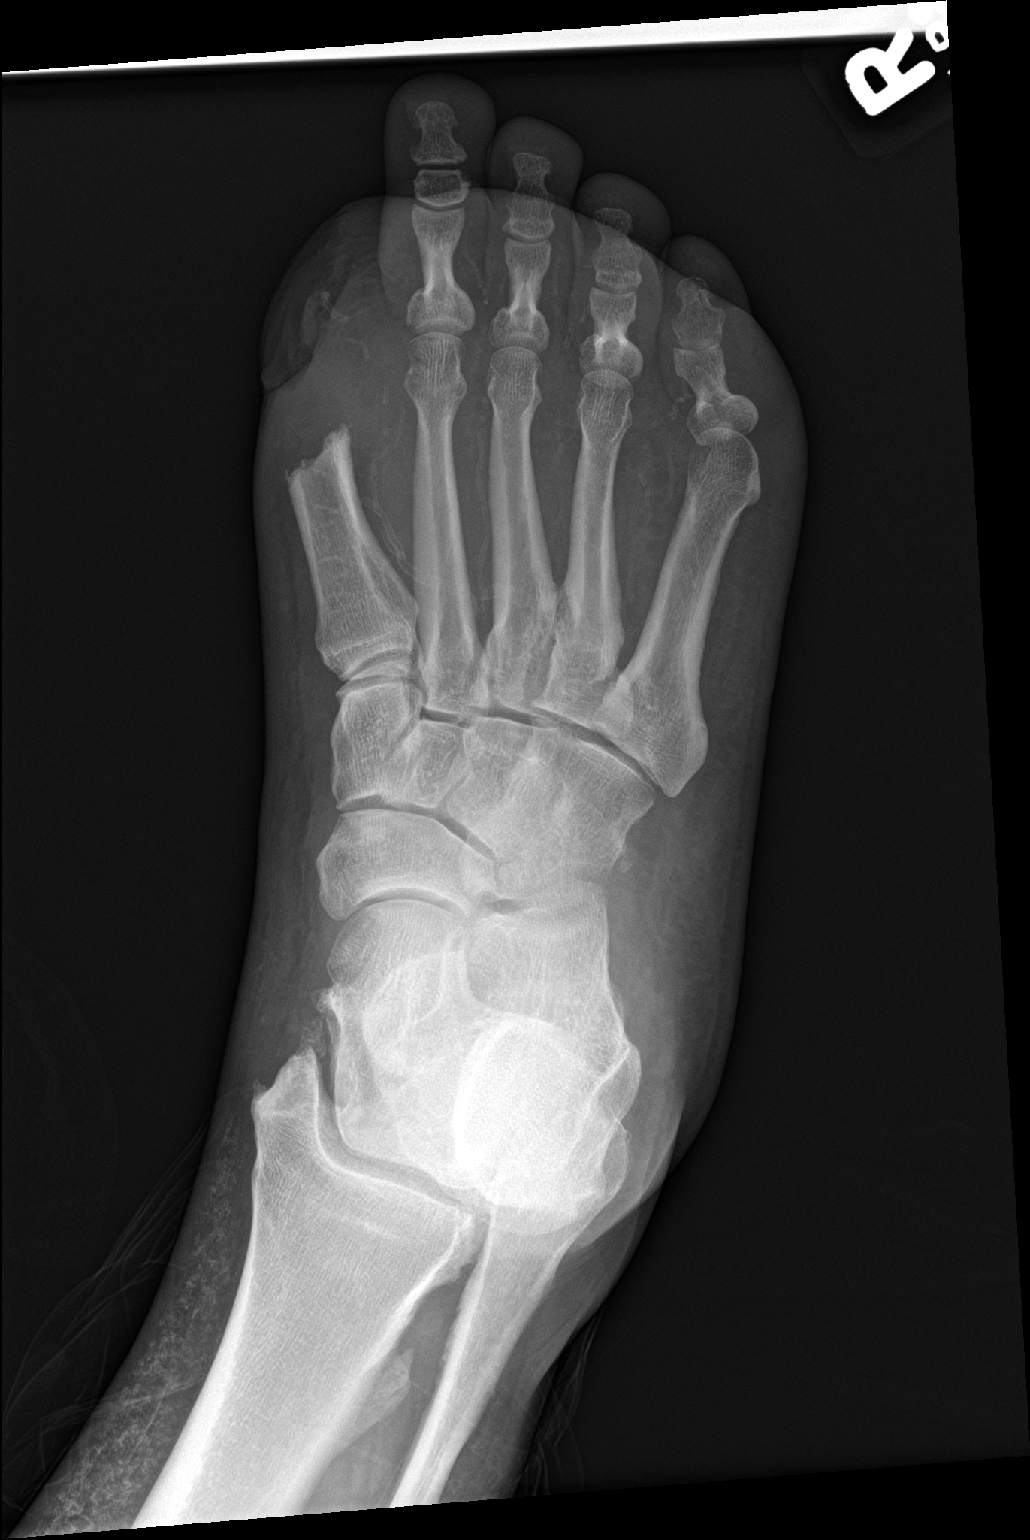

[foot lat]
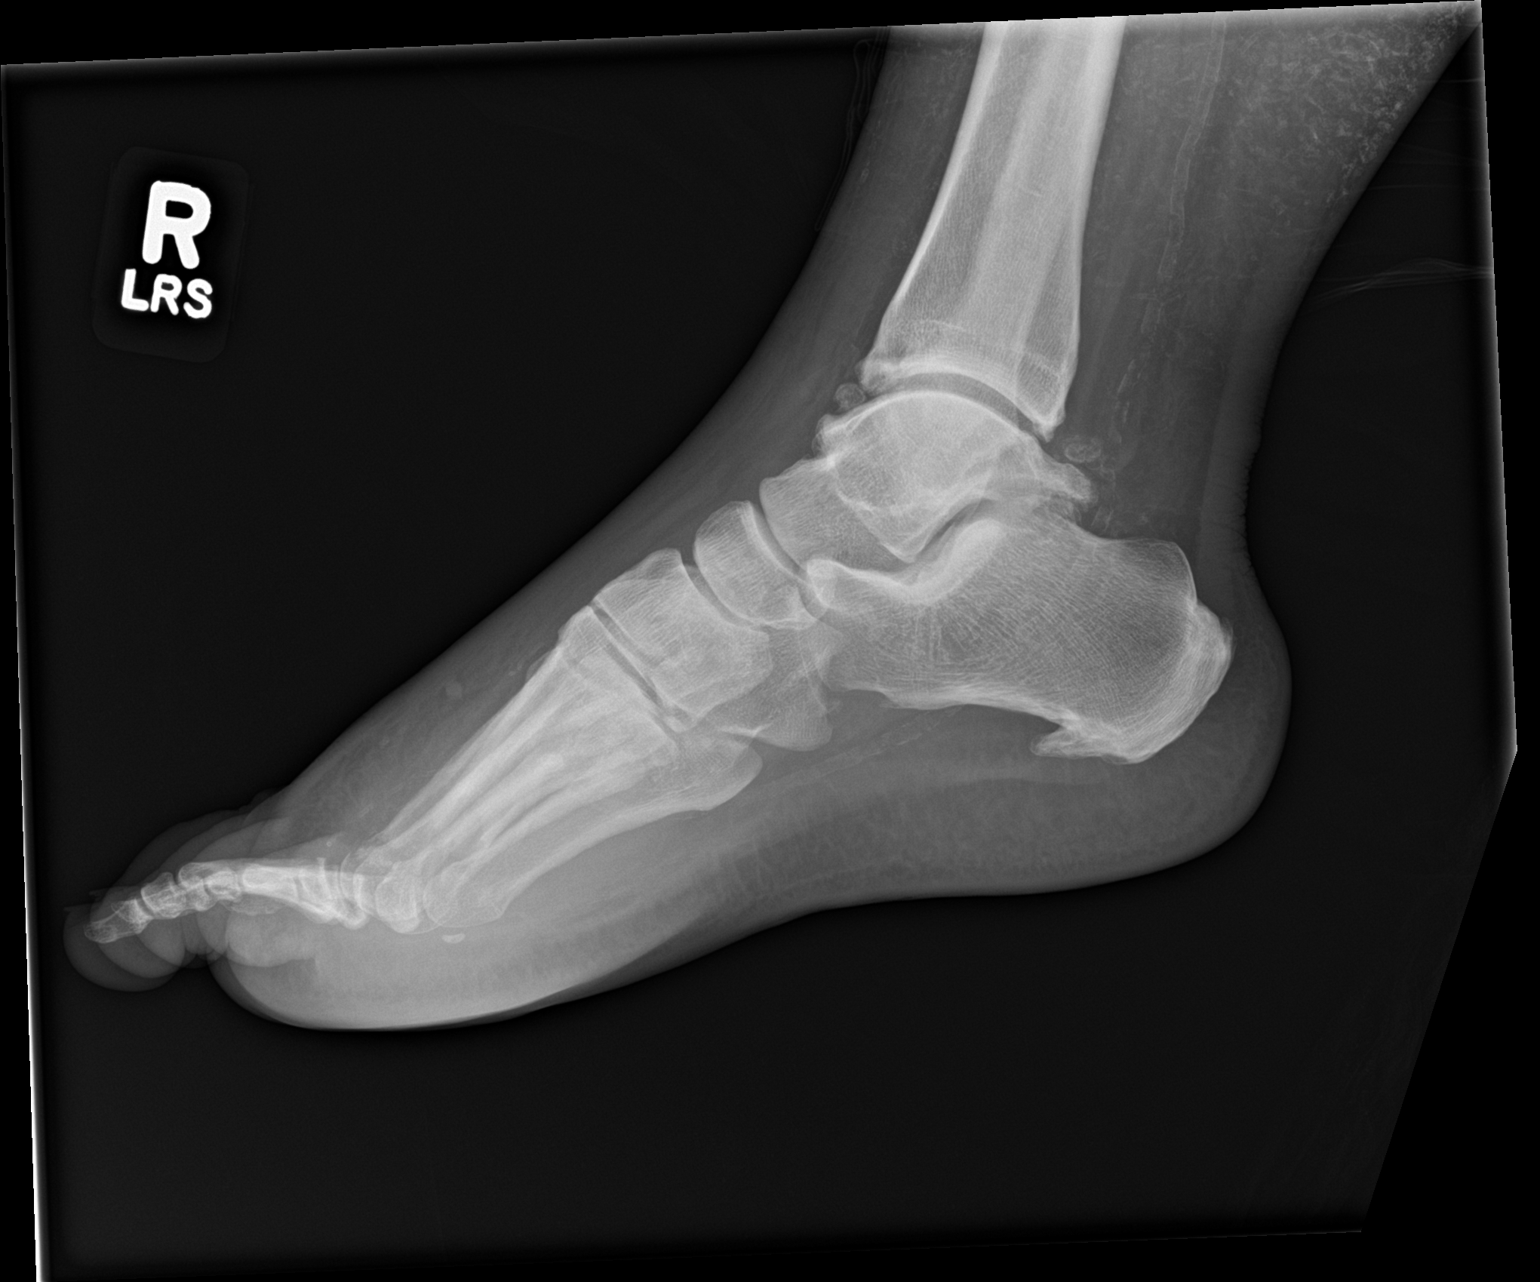

[3 of 3 positions shown; findings below may reference images not displayed]

FINDINGS: The patient is status post amputation at the distal first
metatarsal. The remaining first metatarsal is unchanged in
appearance. No definite osseous erosions are seen, though evaluation
for osteomyelitis is limited on radiograph. The soft tissues are
grossly unchanged in appearance, with mild soft tissue swelling at
the forefoot. Visualized joint spaces are preserved. Scattered
vascular calcifications are seen.

A plantar calcaneal spur is noted.
IMPRESSION: Status post amputation at the distal first metatarsal, unchanged in
appearance on radiograph. No definite osseous erosions seen, though
evaluation for osteomyelitis is limited on radiograph.

## 2020-01-14 IMAGING — MR MR FOOT*R* WO/W CM
9 series · 40 of 40 positions shown · IV contrast (Gadavist)
Comparison: Plain films right foot 11/27/2018 and MRI right foot
10/29/2018.

CLINICAL DATA: Patient status post amputation of the at the level
of the mid to distal right first metatarsal at an outside facility
11/01/2018. Elevated white blood cell count. Right foot pain.

EXAM:
MRI OF THE RIGHT FOREFOOT WITHOUT AND WITH CONTRAST
TECHNIQUE: Multiplanar, multisequence MR imaging of the right forefoot. Was
performed before and after the administration of intravenous
contrast.
CONTRAST:  10 cc Gadavist IV.

[Series 4: T1 · coronal · right · 3.0mm · 0.47mm/px · 5 of 48 slices shown (1 of 2)]
[im 1/48]
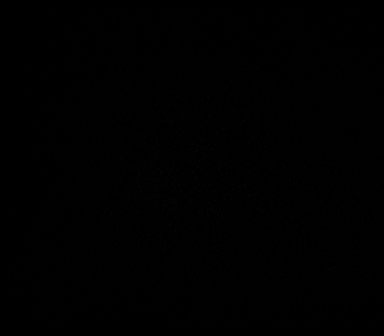
[im 12/48]
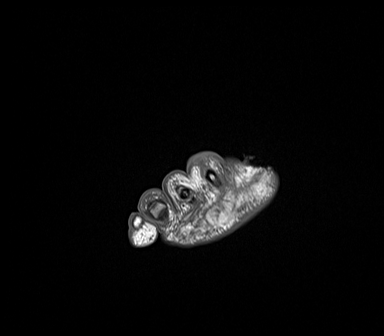
[im 24/48]
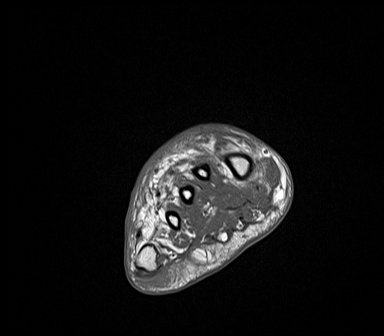
[im 36/48]
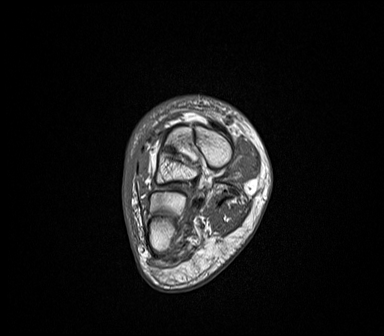
[im 48/48]
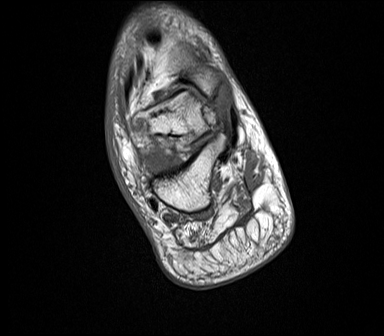

[Series 7: T2 fat-sat · axial · right · 3.0mm · 0.47mm/px · z∈[-33,+86]mm · 5 of 42 slices shown (1 of 2)]
[im 1/42]
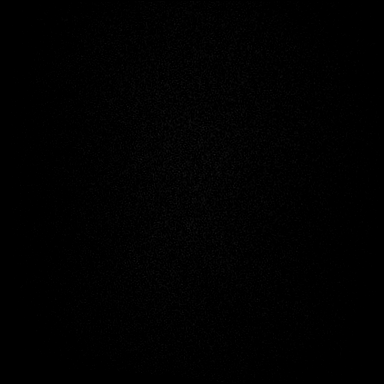
[im 11/42]
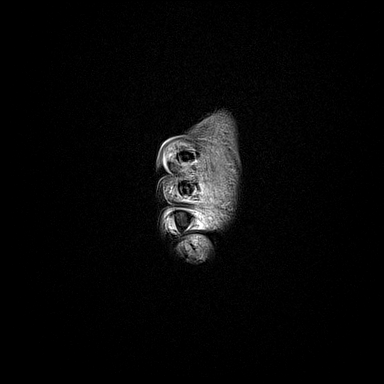
[im 21/42]
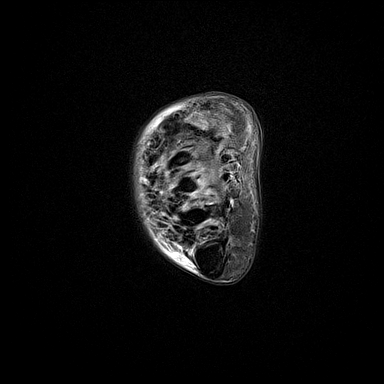
[im 31/42]
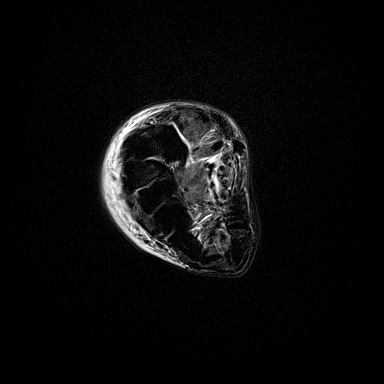
[im 42/42]
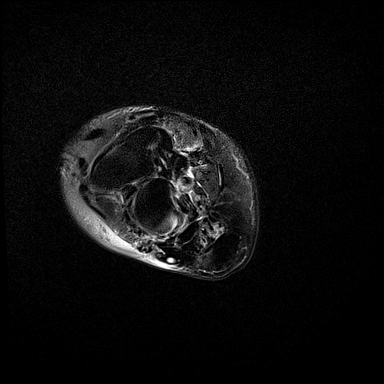

[Series 10: T1 · sagittal · right · 3.0mm · 0.62mm/px · 3 of 28 slices shown (2 of 2)]
[im 1/28]
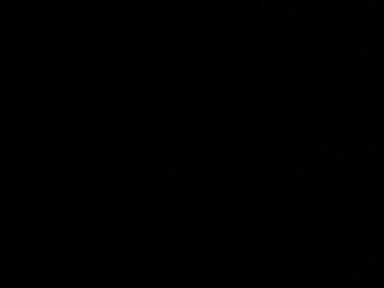
[im 14/28]
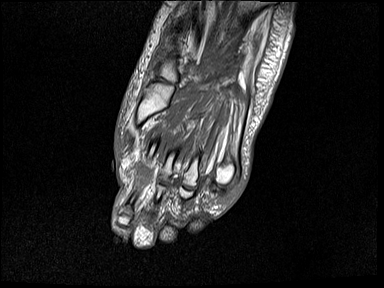
[im 28/28]
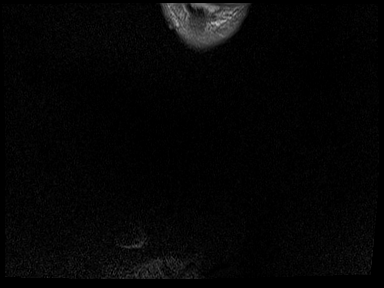

[Series 11: T2 fat-sat · sagittal · right · 3.0mm · 0.60mm/px · 3 of 28 slices shown (2 of 2)]
[im 1/28]
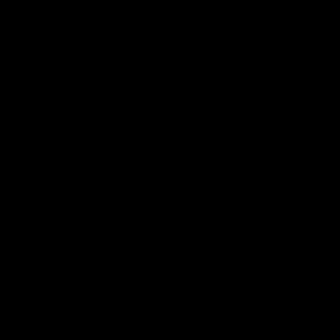
[im 14/28]
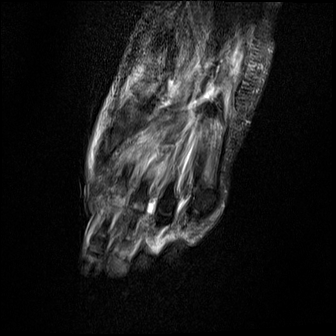
[im 28/28]
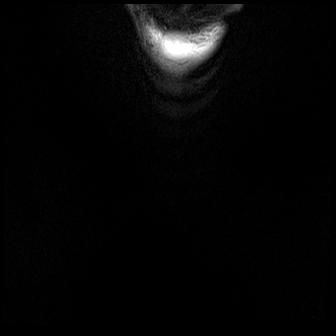

[Series 12: STIR · coronal · right · 3.0mm · 0.59mm/px · 4 of 30 slices shown]
[im 1/30]
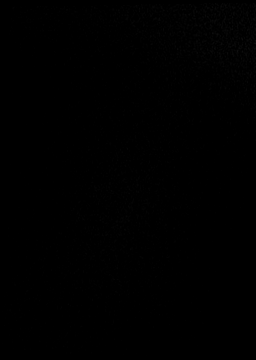
[im 10/30]
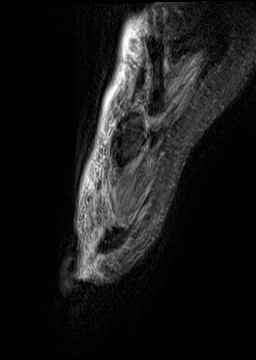
[im 20/30]
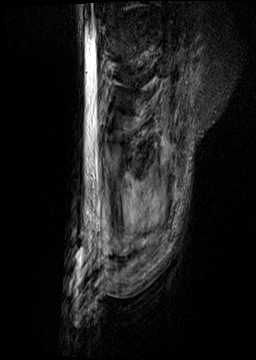
[im 30/30]
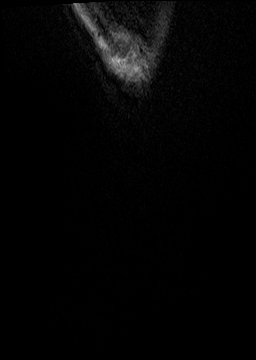

[Series 13: T1 fat-sat · axial · non-contrast · right · 3.0mm · 0.59mm/px · z∈[-44,+93]mm · 6 of 48 slices shown (1 of 3)]
[im 1/48]
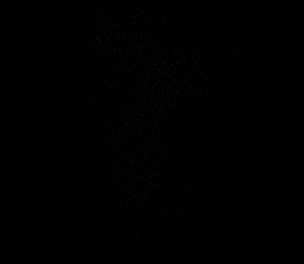
[im 10/48]
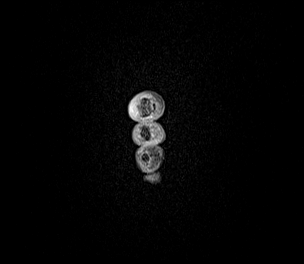
[im 19/48]
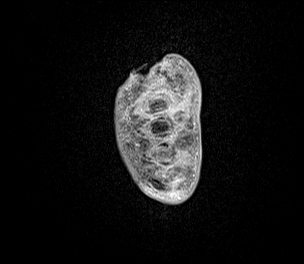
[im 29/48]
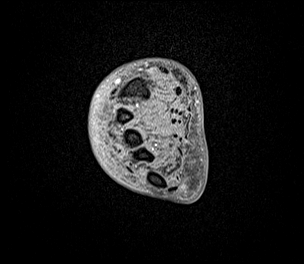
[im 38/48]
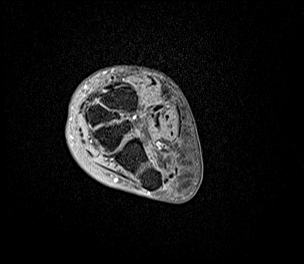
[im 48/48]
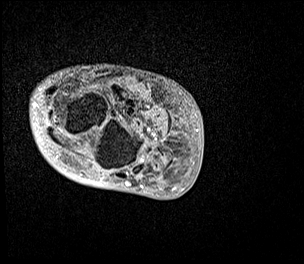

[Series 17: T1 fat-sat post-contrast · axial · right · 3.0mm · 0.59mm/px · z∈[-41,+100]mm · 6 of 48 slices shown]
[im 1/48]
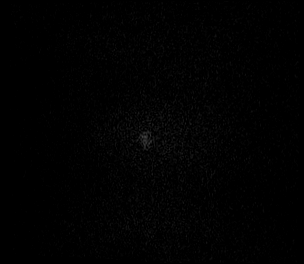
[im 10/48]
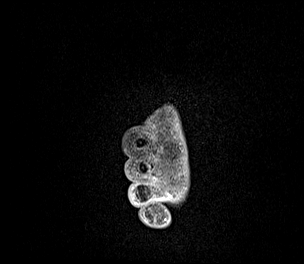
[im 19/48]
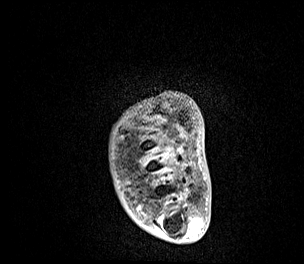
[im 29/48]
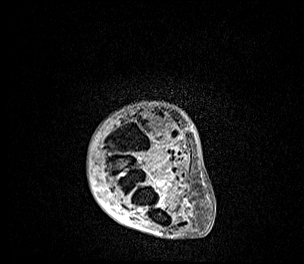
[im 38/48]
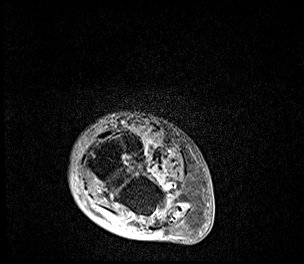
[im 48/48]
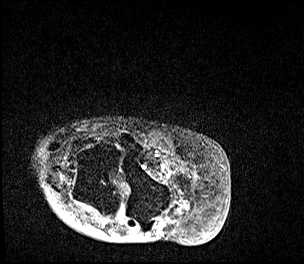

[Series 18: T1 fat-sat · coronal · right · 3.0mm · 0.59mm/px · 5 of 42 slices shown (2 of 3)]
[im 1/42]
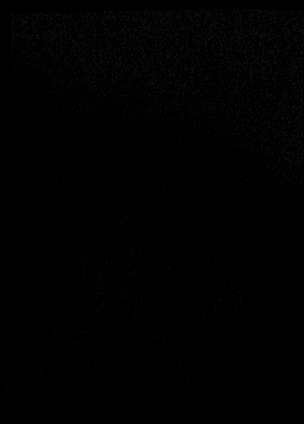
[im 11/42]
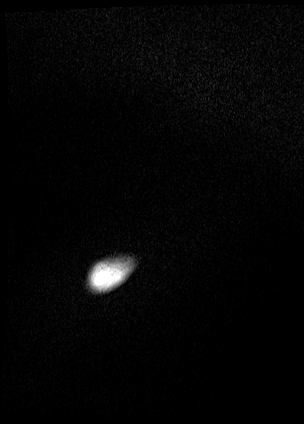
[im 21/42]
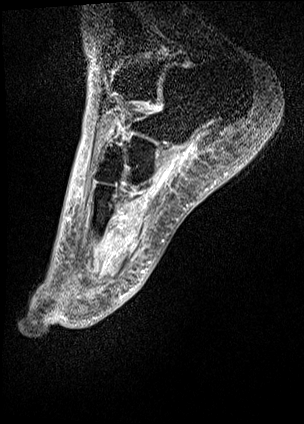
[im 31/42]
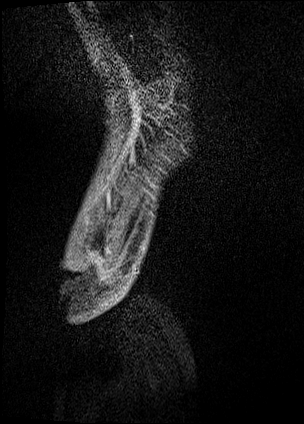
[im 42/42]
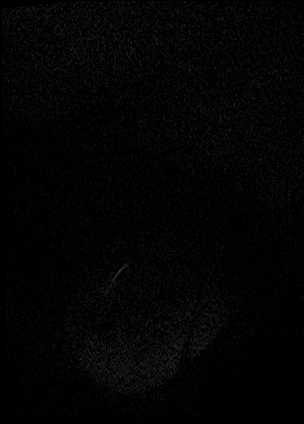

[Series 19: T1 fat-sat · sagittal · right · 3.0mm · 0.56mm/px · 3 of 28 slices shown (3 of 3)]
[im 1/28]
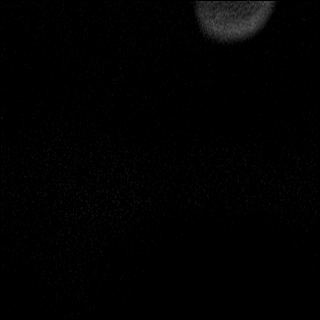
[im 14/28]
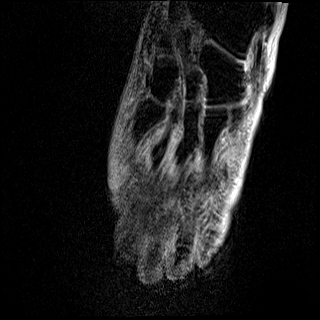
[im 28/28]
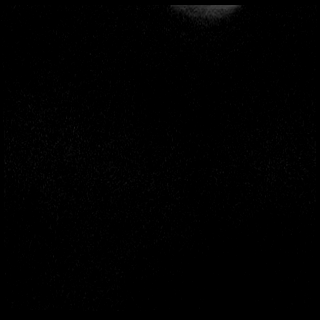

[40 of 40 positions shown; findings below may reference images not displayed]

FINDINGS: Patient motion somewhat degrades the study.

Bones/Joint/Cartilage

As seen on the comparison plain films, the patient is status post
amputation at the level of the junction of the middle and distal
thirds of the diaphysis of the first metatarsal. Mild marrow edema
is seen in the base and proximal diaphysis of the second metatarsal
with an appearance most suggestive of stress change rather than
osteomyelitis. Bone marrow signal is otherwise unremarkable.

Ligaments

Difficult to assess due to motion but grossly intact

Muscles and Tendons

No intramuscular fluid collection or enhancement. Mildly increased
T2 signal in musculature may be due to diabetic myopathy.

Soft tissues

No abscess. Subcutaneous edema is seen over the dorsum of the foot
with mild postcontrast enhancement.
IMPRESSION: Status post amputation at the level of the mid to distal first
metatarsal. Negative for osteomyelitis or abscess.

Mild marrow edema in the proximal second metatarsal has an
appearance most suggestive of stress change rather than
osteomyelitis.

Subcutaneous edema over the dorsum of the foot is compatible with
dependent change and/or cellulitis.

## 2020-01-15 ENCOUNTER — Telehealth (INDEPENDENT_AMBULATORY_CARE_PROVIDER_SITE_OTHER): Payer: Medicaid Other | Admitting: Family Medicine

## 2020-01-15 ENCOUNTER — Other Ambulatory Visit: Payer: Self-pay

## 2020-01-15 DIAGNOSIS — G2581 Restless legs syndrome: Secondary | ICD-10-CM

## 2020-01-15 DIAGNOSIS — IMO0002 Reserved for concepts with insufficient information to code with codable children: Secondary | ICD-10-CM

## 2020-01-15 DIAGNOSIS — G8929 Other chronic pain: Secondary | ICD-10-CM

## 2020-01-15 DIAGNOSIS — E1151 Type 2 diabetes mellitus with diabetic peripheral angiopathy without gangrene: Secondary | ICD-10-CM

## 2020-01-15 DIAGNOSIS — G4733 Obstructive sleep apnea (adult) (pediatric): Secondary | ICD-10-CM | POA: Diagnosis not present

## 2020-01-15 DIAGNOSIS — N390 Urinary tract infection, site not specified: Secondary | ICD-10-CM | POA: Diagnosis not present

## 2020-01-15 DIAGNOSIS — E1140 Type 2 diabetes mellitus with diabetic neuropathy, unspecified: Secondary | ICD-10-CM | POA: Diagnosis not present

## 2020-01-15 MED ORDER — NITROFURANTOIN MONOHYD MACRO 100 MG PO CAPS: 100.0000 mg | ORAL_CAPSULE | Freq: Two times a day (BID) | ORAL | 0 refills | Status: DC

## 2020-01-15 MED ORDER — ROPINIROLE HCL 0.25 MG PO TABS: 0.2500 mg | ORAL_TABLET | Freq: Every day | ORAL | 0 refills | Status: DC

## 2020-01-15 NOTE — Assessment & Plan Note (Signed)
Repeat CPAP titration study ordered, to ensure she is on appropriate pressure setting.

## 2020-01-15 NOTE — Progress Notes (Signed)
[place] Telemedicine Visit  Patient consented to have virtual visit. Method of visit: Video  Encounter participants: Patient: [name] - located at home Provider: [name] - located at office Others (if applicable): n/a  Chief Complaint: follow up   HPI:  UTI - was seen in ED, given rx for macrobid for a UTI but wasn't able to get the medication filled due to transportation issues. Requests I resend this for her. Has had cloudy urine. No dysuria.  OSA - compliant with cpap every night. Has lately noticed morning headaches over the last few weeks. She would like a new CPAP titration study to ensure she is on the proper settings. In the past when OSA wasn't controlled she was getting morning headaches.  Chronic pain - taking norco 10-325mg  three times daily. On 10 days out of the month, she takes an extra tablet. Reviewed with patient her UDS results from last visit, which showed oxycodone in her system. Inquired with her why this would be present, when she is only prescribed hydrocodone. Patient states she was staying with a friend and was out of her flexeril. The friend gave her a pill that was supposed to be a muscle relaxer, but per patient, must have instead have been a pain pill. Per patient, she no longer stays with this friend, has her own place now. Commits to not taking any medications not prescribed to her. Has had worsened pain in her legs lately, primarily at night. Gets sensation of needing to move her legs frequently at night.   Diabetes - has tried to schedule follow up with endocrinology but has struggled to get through on the phone.   History of amputations - her physical therapist recommended a new brace/shoe insert for her foot to help her balance. Needs this addressed by me in a face to face encounter in order for medicaid to pay for it. Patient has been diligent in working with physical therapy.  ROS: per HPI  Pertinent PMHx:  history of obesity, diabetes, hypertension, depression, chronic pain, GERD, hyperlipidemia   Exam:  Respiratory: Patient speaking normally in full sentences throughout the encounter, without any respiratory distress evident.  RLE visualized, with R transmetatarsal amputation present. Normal ROM of R ankle.  Assessment/Plan:  DM (diabetes mellitus) type II uncontrolled, periph vascular disorder ([place]) Will send message to endocrinologist to see if they can help get her in for an appointment since patient has had trouble getting through on the phone.  Encounter for chronic pain management Reviewed with patient her UDS results. She has a reasonable explanation for why oxycodone was present in her urine. Has never had inappropriate UDS results in the past. Stressed importance of not taking any medications that are not prescribed to her. She will follow up with me in 3/2 in the office. Will hold on further refills until that time (will not need refill until after that appointment). PDMP is appropriate.   Leg pains she reports are consistent with restless leg syndrome. Add ropinirole to see if this helps.   Obstructive sleep apnea Repeat CPAP titration study ordered, to ensure she is on appropriate pressure setting.  UTI - sent new rx for macrobid since she could not pick up ED rx.  Transmetatarsal amputation - needs R AFO (Ground Reaction AFO dynamic response version) to improve balance and stability in R leg, and to prevent falls.   Time spent during visit with patient: 20 minutes   Total encounter time on day of  service was 30 minutes

## 2020-01-15 NOTE — Assessment & Plan Note (Signed)
>>  ASSESSMENT AND PLAN FOR INSULIN DEPENDENT TYPE 2 DIABETES MELLITUS ([place]) WRITTEN ON 01/15/2020  2:36 PM BY Mahala Rommel, [name], MD  Will send message to endocrinologist to see if they can help get her in for an appointment since patient has had trouble getting through on the phone.

## 2020-01-15 NOTE — Assessment & Plan Note (Addendum)
Reviewed with patient her UDS results. She has a reasonable explanation for why oxycodone was present in her urine. Has never had inappropriate UDS results in the past. Stressed importance of not taking any medications that are not prescribed to her. She will follow up with me in 3/2 in the office. Will hold on further refills until that time (will not need refill until after that appointment). PDMP is appropriate.   Leg pains she reports are consistent with restless leg syndrome. Add ropinirole to see if this helps.

## 2020-01-15 NOTE — Assessment & Plan Note (Signed)
Will send message to endocrinologist to see if they can help get her in for an appointment since patient has had trouble getting through on the phone.

## 2020-01-16 ENCOUNTER — Ambulatory Visit: Payer: Medicaid Other | Admitting: Physical Therapy

## 2020-01-16 DIAGNOSIS — E1140 Type 2 diabetes mellitus with diabetic neuropathy, unspecified: Secondary | ICD-10-CM | POA: Diagnosis not present

## 2020-01-16 NOTE — Addendum Note (Signed)
Addended by: Isaias Cowman on: 01/16/2020 03:28 PM   Modules accepted: Orders

## 2020-01-17 DIAGNOSIS — E113393 Type 2 diabetes mellitus with moderate nonproliferative diabetic retinopathy without macular edema, bilateral: Secondary | ICD-10-CM | POA: Diagnosis not present

## 2020-01-17 DIAGNOSIS — H04123 Dry eye syndrome of bilateral lacrimal glands: Secondary | ICD-10-CM | POA: Diagnosis not present

## 2020-01-17 DIAGNOSIS — E1140 Type 2 diabetes mellitus with diabetic neuropathy, unspecified: Secondary | ICD-10-CM | POA: Diagnosis not present

## 2020-01-17 DIAGNOSIS — Z794 Long term (current) use of insulin: Secondary | ICD-10-CM | POA: Diagnosis not present

## 2020-01-17 DIAGNOSIS — H25813 Combined forms of age-related cataract, bilateral: Secondary | ICD-10-CM | POA: Diagnosis not present

## 2020-01-17 LAB — HM DIABETES EYE EXAM

## 2020-01-18 ENCOUNTER — Ambulatory Visit: Payer: Medicaid Other | Admitting: Physical Therapy

## 2020-01-18 DIAGNOSIS — E1140 Type 2 diabetes mellitus with diabetic neuropathy, unspecified: Secondary | ICD-10-CM | POA: Diagnosis not present

## 2020-01-20 ENCOUNTER — Telehealth: Payer: Self-pay | Admitting: Family Medicine

## 2020-01-20 DIAGNOSIS — E1140 Type 2 diabetes mellitus with diabetic neuropathy, unspecified: Secondary | ICD-10-CM | POA: Diagnosis not present

## 2020-01-20 NOTE — Telephone Encounter (Signed)
Patient called the after-hours line with complaint of chest pain.  She was recently seen in the office by her PCP, Dr. [name] on 01/15/2020.  At that time she was started on new 2 new medications Requip and nitrofurantoin.  Patient wondered if that is what was causing her chest pain.  Chest pain has been constant for past 3 days.  Not overall worsening or improving.  Gets worse with movement/exertion, better at rest.  Describes it as sharp pain on the right side of her chest.  Nonradiating to the neck or arm.  Patient felt dizzy yesterday, but no nausea or diaphoresis.  Patient has no history of chest pain.  Denies shortness of breath.  Given the fact that the patient is complaining of worsening chest pain with exertion, I told her there was concern for unstable angina and that she should go to the emergency department to get worked up with lab work and an EKG to rule out a cardiac cause of her chest pain.  If a cardiac cause is ruled out, she can make an appointment with our office at the earliest available time to work-up other causes of her chest pain.  I will forward this to her PCP, Dr. [name].

## 2020-01-21 ENCOUNTER — Encounter: Payer: Medicaid Other | Admitting: Physical Therapy

## 2020-01-21 DIAGNOSIS — E1140 Type 2 diabetes mellitus with diabetic neuropathy, unspecified: Secondary | ICD-10-CM | POA: Diagnosis not present

## 2020-01-22 DIAGNOSIS — E1140 Type 2 diabetes mellitus with diabetic neuropathy, unspecified: Secondary | ICD-10-CM | POA: Diagnosis not present

## 2020-01-23 ENCOUNTER — Ambulatory Visit: Payer: Medicaid Other | Admitting: Physical Therapy

## 2020-01-23 DIAGNOSIS — R079 Chest pain, unspecified: Secondary | ICD-10-CM | POA: Diagnosis not present

## 2020-01-23 DIAGNOSIS — R0789 Other chest pain: Secondary | ICD-10-CM | POA: Diagnosis not present

## 2020-01-23 DIAGNOSIS — E1165 Type 2 diabetes mellitus with hyperglycemia: Secondary | ICD-10-CM | POA: Diagnosis not present

## 2020-01-23 DIAGNOSIS — E1140 Type 2 diabetes mellitus with diabetic neuropathy, unspecified: Secondary | ICD-10-CM | POA: Diagnosis not present

## 2020-01-23 DIAGNOSIS — R0602 Shortness of breath: Secondary | ICD-10-CM | POA: Diagnosis not present

## 2020-01-23 DIAGNOSIS — R42 Dizziness and giddiness: Secondary | ICD-10-CM | POA: Diagnosis not present

## 2020-01-24 ENCOUNTER — Emergency Department (HOSPITAL_COMMUNITY)
Admission: EM | Admit: 2020-01-24 | Discharge: 2020-01-24 | Disposition: A | Discharge status: Home / Self Care | Payer: Medicaid Other | Attending: Emergency Medicine | Admitting: Emergency Medicine

## 2020-01-24 ENCOUNTER — Emergency Department (HOSPITAL_COMMUNITY): Payer: Medicaid Other

## 2020-01-24 ENCOUNTER — Other Ambulatory Visit: Payer: Self-pay

## 2020-01-24 ENCOUNTER — Encounter (HOSPITAL_COMMUNITY): Payer: Self-pay

## 2020-01-24 ENCOUNTER — Encounter: Payer: Medicaid Other | Admitting: Physical Therapy

## 2020-01-24 DIAGNOSIS — E119 Type 2 diabetes mellitus without complications: Secondary | ICD-10-CM | POA: Insufficient documentation

## 2020-01-24 DIAGNOSIS — I10 Essential (primary) hypertension: Secondary | ICD-10-CM | POA: Insufficient documentation

## 2020-01-24 DIAGNOSIS — J449 Chronic obstructive pulmonary disease, unspecified: Secondary | ICD-10-CM | POA: Diagnosis not present

## 2020-01-24 DIAGNOSIS — E669 Obesity, unspecified: Secondary | ICD-10-CM | POA: Diagnosis not present

## 2020-01-24 DIAGNOSIS — Z20822 Contact with and (suspected) exposure to covid-19: Secondary | ICD-10-CM | POA: Diagnosis not present

## 2020-01-24 DIAGNOSIS — R079 Chest pain, unspecified: Secondary | ICD-10-CM | POA: Diagnosis not present

## 2020-01-24 DIAGNOSIS — R0789 Other chest pain: Secondary | ICD-10-CM | POA: Diagnosis not present

## 2020-01-24 DIAGNOSIS — Z89512 Acquired absence of left leg below knee: Secondary | ICD-10-CM | POA: Diagnosis not present

## 2020-01-24 DIAGNOSIS — Z87891 Personal history of nicotine dependence: Secondary | ICD-10-CM | POA: Diagnosis not present

## 2020-01-24 DIAGNOSIS — M2550 Pain in unspecified joint: Secondary | ICD-10-CM | POA: Diagnosis not present

## 2020-01-24 DIAGNOSIS — E1165 Type 2 diabetes mellitus with hyperglycemia: Secondary | ICD-10-CM | POA: Diagnosis not present

## 2020-01-24 DIAGNOSIS — Z6841 Body Mass Index (BMI) 40.0 and over, adult: Secondary | ICD-10-CM | POA: Insufficient documentation

## 2020-01-24 DIAGNOSIS — Z794 Long term (current) use of insulin: Secondary | ICD-10-CM | POA: Insufficient documentation

## 2020-01-24 DIAGNOSIS — Z79899 Other long term (current) drug therapy: Secondary | ICD-10-CM | POA: Diagnosis not present

## 2020-01-24 DIAGNOSIS — R0902 Hypoxemia: Secondary | ICD-10-CM | POA: Diagnosis not present

## 2020-01-24 LAB — BASIC METABOLIC PANEL
Anion gap: 15 (ref 5–15)
BUN: 30 mg/dL — ABNORMAL HIGH (ref 6–20)
CO2: 20 mmol/L — ABNORMAL LOW (ref 22–32)
Calcium: 8.7 mg/dL — ABNORMAL LOW (ref 8.9–10.3)
Chloride: 94 mmol/L — ABNORMAL LOW (ref 98–111)
Creatinine, Ser: 0.99 mg/dL (ref 0.44–1.00)
GFR calc Af Amer: >60 mL/min (ref >60)
GFR calc non Af Amer: >60 mL/min (ref >60)
Glucose, Bld: 516 mg/dL (ref 70–99)
Potassium: 4.4 mmol/L (ref 3.5–5.1)
Sodium: 129 mmol/L — ABNORMAL LOW (ref 135–145)

## 2020-01-24 LAB — CBC
HCT: 37.6 % (ref 36.0–46.0)
Hemoglobin: 12.5 g/dL (ref 12.0–15.0)
MCH: 30 pg (ref 26.0–34.0)
MCHC: 33.2 g/dL (ref 30.0–36.0)
MCV: 90.4 fL (ref 80.0–100.0)
Platelets: 399 10*3/uL (ref 150–400)
RBC: 4.16 MIL/uL (ref 3.87–5.11)
RDW: 12.5 % (ref 11.5–15.5)
WBC: 11.9 10*3/uL — ABNORMAL HIGH (ref 4.0–10.5)
nRBC: 0 % (ref 0.0–0.2)

## 2020-01-24 LAB — I-STAT BETA HCG BLOOD, ED (MC, WL, AP ONLY): I-stat hCG, quantitative: <5 m[IU]/mL (ref <5)

## 2020-01-24 LAB — TROPONIN I (HIGH SENSITIVITY)
Troponin I (High Sensitivity): 9 ng/L (ref <18)
Troponin I (High Sensitivity): 10 ng/L (ref <18)

## 2020-01-24 LAB — CBG MONITORING, ED: Glucose-Capillary: 350 mg/dL — ABNORMAL HIGH (ref 70–99)

## 2020-01-24 MED ORDER — INSULIN ASPART 100 UNIT/ML ~~LOC~~ SOLN
10.0000 [IU] | Freq: Once | SUBCUTANEOUS | Status: AC
  Administered 2020-01-24: 03:00:00 10 [IU] via SUBCUTANEOUS

## 2020-01-24 MED ORDER — KETOROLAC TROMETHAMINE 30 MG/ML IJ SOLN
30.0000 mg | Freq: Once | INTRAMUSCULAR | Status: DC
  Filled 2020-01-24: qty 1

## 2020-01-24 MED ORDER — ONDANSETRON HCL 4 MG/2ML IJ SOLN
4.0000 mg | Freq: Once | INTRAMUSCULAR | Status: AC
  Administered 2020-01-24: 4 mg via INTRAVENOUS
  Filled 2020-01-24: qty 2

## 2020-01-24 MED ORDER — SODIUM CHLORIDE 0.9 % IV BOLUS
2000.0000 mL | Freq: Once | INTRAVENOUS | Status: AC
  Administered 2020-01-24: 03:00:00 2000 mL via INTRAVENOUS

## 2020-01-24 MED ORDER — CELECOXIB 100 MG PO CAPS: 100.0000 mg | ORAL_CAPSULE | Freq: Two times a day (BID) | ORAL | 0 refills | Status: DC

## 2020-01-24 MED ORDER — FENTANYL CITRATE (PF) 100 MCG/2ML IJ SOLN
50.0000 ug | Freq: Once | INTRAMUSCULAR | Status: AC
  Administered 2020-01-24: 03:00:00 50 ug via INTRAVENOUS
  Filled 2020-01-24: qty 2

## 2020-01-24 NOTE — Discharge Instructions (Signed)
Your caregiver has diagnosed you as having chest pain that is not specific for one problem, but does not require admission.  You are at low risk for an acute heart condition or other serious illness. Chest pain comes from many different causes.  SEEK IMMEDIATE MEDICAL ATTENTION IF: You have severe chest pain, especially if the pain is crushing or pressure-like and spreads to the arms, back, neck, or jaw, or if you have sweating, nausea (feeling sick to your stomach), or shortness of breath. THIS IS AN EMERGENCY. Don't wait to see if the pain will go away. Get medical help at once. Call 911 or [phone] (operator). DO NOT drive yourself to the hospital.  Your chest pain gets worse and does not go away with rest.  You have an attack of chest pain lasting longer than usual, despite rest and treatment with the medications your caregiver has prescribed.  You wake from sleep with chest pain or shortness of breath.  You feel dizzy or faint.  You have chest pain not typical of your usual pain for which you originally saw your caregiver.  

## 2020-01-24 NOTE — ED Notes (Signed)
Called [facility] for transport home--[name] 

## 2020-01-24 NOTE — ED Triage Notes (Addendum)
Pt from home w CC of chest pain x3 days-1wk. Pt says pain comes & goes, is exacerbated by movement, does not radiate. Pain is sharp, 8/10 324 asa PTA, VSS

## 2020-01-24 NOTE — ED Notes (Signed)
Lab called to report critical blood glucose of 516. PA Harris notified

## 2020-01-24 NOTE — ED Provider Notes (Signed)
[facility] EMERGENCY DEPARTMENT Provider Note   CSN: [id number] Arrival date & time: 01/24/20  0133     History Chief Complaint  Patient presents with  . Chest Pain    [name] is a 44 y.o. female with a a pmh of poorly controlled DM asthma, COPD, costochondritis, depression, HLD, Drug seeking behavior, OSA, HTN. S/p left BKA.  She presents with R sided Chest pain described as sharp, intermittent, worse when she lifts herself into a wheelchair.  She denies hemoptysis, leg swelling, hx of DVT or PE.  She denies nausea, diaphoresis, sob.  The history is provided by the patient and medical records.  Chest Pain Pain location:  R chest Pain quality: sharp and stabbing   Pain radiates to:  Does not radiate Pain severity:  Severe Onset quality:  Gradual Duration:  1 week Timing:  Intermittent Progression:  Worsening Chronicity:  New Context: lifting and movement   Context: not breathing, not drug use, not eating, not intercourse, not raising an arm, not at rest, not stress and not trauma   Relieved by:  Nothing Worsened by:  Movement Ineffective treatments:  None tried Associated symptoms: no abdominal pain, no altered mental status, no anorexia, no anxiety, no back pain, no claudication, no cough, no diaphoresis, no fatigue, no fever, no headache, no heartburn, no lower extremity edema, no nausea, no near-syncope, no numbness and no orthopnea   Risk factors: diabetes mellitus, hypertension and obesity   Risk factors: no aortic disease, no birth control and no prior DVT/PE     HPI: A 44 year old patient with a history of treated diabetes, hypertension, hypercholesterolemia and obesity presents for evaluation of chest pain. Initial onset of pain was less than one hour ago. The patient's chest pain is sharp and is worse with exertion. The patient's chest pain is not middle- or left-sided, is not well-localized, is not described as heaviness/pressure/tightness and  does not radiate to the arms/jaw/neck. The patient does not complain of nausea and denies diaphoresis. The patient has no history of stroke, has no history of peripheral artery disease, has not smoked in the past 90 days and has no relevant family history of coronary artery disease (first degree relative at less than age 11).   Past Medical History:  Diagnosis Date  . Acute osteomyelitis of ankle or foot, left ([place]) 01/31/2018  . Alveolar hypoventilation   . Anemia    not on iron pill  . Asthma   . Bipolar 2 disorder ([place])   . Carpal tunnel syndrome on right    recurrent  . Cellulitis 08/2010-08/2011  . Chronic pain   . COPD (chronic obstructive pulmonary disease) ([name])    Symbicort daily and Proventil as needed  . Costochondritis   . Depression   . Diabetes mellitus type II, uncontrolled ([place]) 2000   Type 2, Uncontrolled.Takes Lantus daily.Fasting blood sugar runs 150  . Dizziness    occasionally  . Drug-seeking behavior   . GERD (gastroesophageal reflux disease)    takes Pantoprazole and Zantac daily  . Headache    migraine-last one about a yr ago.Topamax daily  . HLD (hyperlipidemia)    takes Atorvastatin daily  . Hypertension    takes Lisinopril and Coreg daily  . Morbid obesity ([place])   . Muscle spasm    takes Flexeril as needed  . Nocturia   . OSA on CPAP   . Peripheral neuropathy    takes Gabapentin daily  . Pneumonia    "walking" several  yrs ago and as a baby (12/05/2018)  . Rectal fissure   . Restless leg   . Syncope 02/25/2016  . Urinary frequency   . Varicose veins    Right medial thigh and Left leg     Patient Active Problem List   Diagnosis Date Noted  . UTI (urinary tract infection) 06/28/2019  . Right hand pain 06/23/2019  . Amputation above knee ([place]) 06/23/2019  . Left shoulder pain 06/23/2019  . Dysuria 06/14/2019  . Abdominal pain   . Fever   . Abscess of skin of abdomen   . Abscess of buttock, right 05/17/2019  . Hyperglycemia   . Severe  protein-calorie malnutrition ([place])   . Amputated toe, right ([place]) 04/11/2019  . 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[place]) 04/11/2019  . Hypertriglyceridemia 04/11/2019  . Type 2 diabetes mellitus with diabetic polyneuropathy, with long-term current use of insulin ([place]) 04/11/2019  . Type 2 diabetes mellitus with hyperglycemia, with long-term current use of insulin ([place]) 04/11/2019  . Infection of right foot   . Cellulitis of great toe of right foot 10/29/2018  . Osteomyelitis of great toe of right foot ([place]) 10/29/2018  . Encounter for orthopedic aftercare following surgical amputation 06/28/2018  . Uncontrolled type 2 diabetes mellitus with insulin therapy ([place]) 04/16/2018  . Allergic rhinitis 03/06/2018  . Unilateral complete BKA, left, sequela ([place])   . Class 3 severe obesity due to excess calories with serious comorbidity and body mass index (BMI) of 50.0 to 59.9 in adult [facility])   . AKI (acute kidney injury) ([place])   . Decreased pedal pulses 06/10/2017  . Unilateral primary osteoarthritis, right knee 10/22/2016  . Primary osteoarthritis of first carpometacarpal joint of left hand 07/30/2016  . Diabetic neuropathy ([place]) 07/14/2016  . Nausea 03/17/2016  . De Quervain's tenosynovitis, bilateral 11/01/2015  . Vitamin D deficiency 09/05/2015  . Recurrent candidiasis of vagina 09/05/2015  . Varicose veins of leg with complications 97/41/6384  . Restless leg syndrome 10/17/2014  . Chronic sinusitis 07/18/2014  . Headache 07/15/2014  . Encounter for chronic pain management 06/30/2013  . HLD (hyperlipidemia) 11/19/2012  . Chest pain 06/27/2012  . Right carpal tunnel syndrome 09/01/2011  . Bilateral knee pain 09/01/2011  . DM (diabetes mellitus) type II uncontrolled, periph vascular disorder ([place]) 05/22/2008  . Morbid obesity ([place]) 05/22/2008  . 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 Depression 05/22/2008  . Obstructive sleep apnea 05/22/2008  . Hypertension 05/22/2008  . Asthma 05/22/2008    . GERD 05/22/2008    Past Surgical History:  Procedure Laterality Date  . AMPUTATION Left 02/01/2018   Procedure: LEFT FOURTH AND 5TH TOE RAY AMPUTATION;  Surgeon: [name], MD;  Location: [facility];  Service: Orthopedics;  Laterality: Left;  . AMPUTATION Left 03/03/2018   Procedure: LEFT BELOW KNEE AMPUTATION;  Surgeon: [name], MD;  Location: [facility];  Service: Orthopedics;  Laterality: Left;  . CARPAL TUNNEL RELEASE Bilateral   . CESAREAN SECTION  2007  . INCISION AND DRAINAGE PERIRECTAL ABSCESS Left 05/18/2019   Procedure: IRRIGATION AND DEBRIDEMENT OF PANNIS ABSCESS, POSSIBLE DEBRIDEMENT OF BUTTOCK WOUND;  Surgeon: Donnie Mesa, MD;  Location: [facility];  Service: General;  Laterality: Left;  . IRRIGATION AND DEBRIDEMENT BUTTOCKS Left 05/17/2019   Procedure: IRRIGATION AND DEBRIDEMENT BUTTOCKS;  Surgeon: Donnie Mesa, MD;  Location: [facility];  Service: General;  Laterality: Left;  . KNEE ARTHROSCOPY Right 07/17/2010  . LEFT HEART CATHETERIZATION WITH CORONARY ANGIOGRAM N/A 07/27/2012   Procedure: LEFT HEART CATHETERIZATION WITH  CORONARY ANGIOGRAM;  Surgeon: Sherren Mocha, MD;  Location[facility] C[facility] LAB;  Service: Cardiovascular;  Laterality: N/A;  . MASS EXCISION N/A 06/29/2013   Procedure:  WIDE LOCAL EXCISION OF POSTERIOR NECK ABSCESS;  Surgeon: Ralene Ok, MD;  Location: [facility];  Service: General;  Laterality: N/A;  . REPAIR KNEE LIGAMENT Left    "fixed ligaments and chipped patella"  . right transmetatarsal amputation        OB History    Gravida  2   Para  1   Term      Preterm      AB  1   Living  1     SAB  1   TAB      Ectopic      Multiple      Live Births              Family History  Problem Relation Age of Onset  . Diabetes Mother   . Hyperlipidemia Mother   . Depression Mother   . Varicose Veins Mother   . Heart attack Paternal Uncle   . Heart disease Paternal Grandmother   . Heart attack Paternal Grandmother   . Heart attack Paternal  Grandfather   . Heart disease Paternal Grandfather   . Heart attack Father   . Cancer Maternal Grandmother        COLON  . Hypertension Maternal Grandmother   . Hyperlipidemia Maternal Grandmother   . Diabetes Maternal Grandmother   . Other Maternal Grandfather        GUN SHOT    Social History   Tobacco Use  . Smoking status: Former Smoker    Packs/day: 0.30    Years: 0.30    Pack years: 0.09    Types: Cigarettes    Quit date: 12/06/1993    Years since quitting: 26.1  . Smokeless tobacco: Never Used  Substance Use Topics  . Alcohol use: No    Alcohol/week: 0.0 standard drinks  . Drug use: Yes    Comment: OD attempts on home meds      Home Medications Prior to Admission medications   Medication Sig Start Date End Date Taking? Authorizing Provider  amLODipine (NORVASC) 10 MG tablet Take 1 tablet (10 mg total) by mouth daily. 05/25/19  Yes [name], MD  atorvastatin (LIPITOR) 20 MG tablet Take 1 tablet (20 mg total) by mouth daily. 03/16/18  Yes Angiulli, Lavon Paganini, PA-C  budesonide-formoterol (SYMBICORT) 160-4.5 MCG/ACT inhaler inhale 2 PUFFS into THE lungs 2 TIMES DAILY Patient taking differently: Inhale 2 puffs into the lungs 2 (two) times daily.  07/11/19  Yes [name], MD  cetirizine (ZYRTEC) 10 MG tablet Take 10 mg by mouth daily as needed for allergies.   Yes [provider]  Cholecalciferol 1000 units tablet Take 1 tablet (1,000 Units total) by mouth daily. 04/05/17  Yes [name], MD  cyclobenzaprine (FLEXERIL) 5 MG tablet TAKE 1 TABLET BY MOUTH 3 TIMES DAILY AS NEEDED FOR MUSCLE SPASMS Patient taking differently: Take 5 mg by mouth 3 (three) times daily as needed for muscle spasms.  01/08/20  Yes Martyn Malay, MD  gabapentin (NEURONTIN) 600 MG tablet Take 2 tablets (1,200 mg total) by mouth 3 (three) times daily. 12/10/19  Yes [name], MD  Insulin NPH Isophane & Regular (HUMULIN 70/30 PEN [place]) Inject 35-75 Units into the skin See  admin instructions. Use 75 units every morning and use 35 units every evening  Yes [provider]  pantoprazole (PROTONIX) 40 MG tablet Take 1 tablet (40 mg total) by mouth daily. 03/16/18  Yes Angiulli, Lavon Paganini, PA-C  PROVENTIL HFA 108 (90 Base) MCG/ACT inhaler Inhale 1-2 puffs into the lungs every 6 (six) hours as needed for wheezing or shortness of breath. Patient taking differently: Inhale 2 puffs into the lungs every 4 (four) hours as needed for wheezing.  05/11/19  Yes [name], MD  rOPINIRole (REQUIP) 0.25 MG tablet Take 1 tablet (0.25 mg total) by mouth at bedtime. 01/15/20  Yes [name], MD  Semaglutide,0.25 or 0.5MG/DOS, (OZEMPIC, 0.25 OR 0.5 MG/DOSE,) 2 MG/1.5ML SOPN Inject 0.5 mg into the skin once a week. Patient taking differently: Inject 0.5 mg into the skin every Wednesday.  08/15/19  Yes Shamleffer, Melanie Crazier, MD  Accu-Chek Softclix Lancets lancets Use as instructed to check blood sugar 3 times per day 12/11/19   Shamleffer, Melanie Crazier, MD  Blood Glucose Monitoring Suppl (ACCU-CHEK NANO SMARTVIEW) w/Device KIT Use to check blood sugar 3 times daily 12/11/19   Shamleffer, Melanie Crazier, MD  celecoxib (CELEBREX) 100 MG capsule Take 1 capsule (100 mg total) by mouth 2 (two) times daily. 01/24/20   Alberta Lenhard, PA-C  glucose blood (ACCU-CHEK GUIDE) test strip USE T0 CHECK BLOOD SUGAR 3 TIMES DAILY 12/11/19   Shamleffer, Melanie Crazier, MD  HYDROcodone-acetaminophen (NORCO) 10-325 MG tablet Take 1 tablet by mouth every 8 (eight) hours as needed. Patient not taking: Reported on 01/24/2020 11/28/19   [name], MD  Insulin Isophane & Regular Human (HUMULIN 70/30 MIX) (70-30) 100 UNIT/ML PEN Inject 70 Units into the skin daily with breakfast AND 35 Units daily with supper. Patient not taking: Reported on 01/24/2020 04/11/19   Shamleffer, Melanie Crazier, MD  nitrofurantoin, macrocrystal-monohydrate, (MACROBID) 100 MG capsule Take 1 capsule (100  mg total) by mouth 2 (two) times daily. Patient not taking: Reported on 01/24/2020 01/15/20   [name], MD    Allergies    Kiwi extract, Cefepime, Morphine and related, Trental [pentoxifylline], and Nubain [nalbuphine hcl]  Review of Systems   Review of Systems  Constitutional: Negative for diaphoresis, fatigue and fever.  Respiratory: Negative for cough.   Cardiovascular: Positive for chest pain. Negative for orthopnea, claudication and near-syncope.  Gastrointestinal: Negative for abdominal pain, anorexia, heartburn and nausea.  Musculoskeletal: Negative for back pain.  Neurological: Negative for numbness and headaches.  All other systems reviewed and are negative.   Physical Exam Updated Vital Signs BP 118/71   Pulse 96   Temp 98.1 F (36.7 C) (Oral)   Resp 11   Ht _0  (1.575 m)   Wt (!) 140.2 kg   SpO2 94%   BMI 56.52 kg/m   Physical Exam Vitals and nursing note reviewed.  Constitutional:      General: She is not in acute distress.    Appearance: She is well-developed. She is obese. She is ill-appearing (chronically ill appearing). She is not diaphoretic.  HENT:     Head: Normocephalic and atraumatic.  Eyes:     General: No scleral icterus.    Conjunctiva/sclera: Conjunctivae normal.  Cardiovascular:     Rate and Rhythm: Normal rate and regular rhythm.     Heart sounds: Normal heart sounds. No murmur. No friction rub. No gallop.   Pulmonary:     Effort: Pulmonary effort is normal. No respiratory distress.     Breath sounds: Normal breath sounds.  Abdominal:     General:  Bowel sounds are normal. There is no distension.     Palpations: Abdomen is soft. There is no mass.     Tenderness: There is no abdominal tenderness. There is no guarding.  Musculoskeletal:     Cervical back: Normal range of motion.  Skin:    General: Skin is warm and dry.  Neurological:     Mental Status: She is alert and oriented to person, place, and time.  Psychiatric:         Behavior: Behavior normal.     ED Results / Procedures / Treatments   Labs (all labs ordered are listed, but only abnormal results are displayed) Labs Reviewed  BASIC METABOLIC PANEL - Abnormal; Notable for the following components:      Result Value   Sodium 129 (*)    Chloride 94 (*)    CO2 20 (*)    Glucose, Bld 516 (*)    BUN 30 (*)    Calcium 8.7 (*)    All other components within normal limits  CBC - Abnormal; Notable for the following components:   WBC 11.9 (*)    All other components within normal limits  CBG MONITORING, ED - Abnormal; Notable for the following components:   Glucose-Capillary 350 (*)    All other components within normal limits  I-STAT BETA HCG BLOOD, ED ([facility], [facility], [facility] ONLY)  TROPONIN I (HIGH SENSITIVITY)  TROPONIN I (HIGH SENSITIVITY)    EKG EKG Interpretation  Date/Time:  Thursday January 24 2020 01:40:00 EST Ventricular Rate:  96 PR Interval:    QRS Duration: 105 QT Interval:  376 QTC Calculation: 476 R Axis:   87 Text Interpretation: Sinus rhythm ST elev, probable normal early repol pattern No acute changes Confirmed by Addison Lank [id number]) on 01/24/2020 3:46:01 AM   Radiology DG Chest 2 View  Result Date: 01/24/2020 CLINICAL DATA:  Chest pain. EXAM: CHEST - 2 VIEW COMPARISON:  01/01/2020 FINDINGS: Evaluation is limited by the patient's body habitus. There is no definite acute cardiopulmonary process. The lung volumes are somewhat low. There is no large focal infiltrate or pleural effusion. The heart size is stable from prior study. IMPRESSION: No active cardiopulmonary disease. Electronically Signed   By: Constance Holster M.D.   On: 01/24/2020 02:20    Procedures Procedures (including critical care time)  Medications Ordered in ED Medications  sodium chloride 0.9 % bolus 2,000 mL (0 mLs Intravenous Stopped 01/24/20 0520)  insulin aspart (novoLOG) injection 10 Units (10 Units Subcutaneous Given 01/24/20 0319)  fentaNYL (SUBLIMAZE)  injection 50 mcg (50 mcg Intravenous Given 01/24/20 0317)  ondansetron (ZOFRAN) injection 4 mg (4 mg Intravenous Given 01/24/20 0318)    ED Course  I have reviewed the triage vital signs and the nursing notes.  Pertinent labs & imaging results that were available during my care of the patient were reviewed by me and considered in my medical decision making (see chart for details).  Clinical Course as of Jan 23 700  Thu Jan 24, 2020  0511 Troponin I (High Sensitivity): 10 [AH]    Clinical Course User Index [AH] Margarita Mail, PA-C   MDM Rules/Calculators/A&P HEAR Score: 3                    Patient here with right sided chest pain. The emergent differential diagnosis of chest pain includes: Acute coronary syndrome, pericarditis, aortic dissection, pulmonary embolism, tension pneumothorax, pneumonia, and esophageal rupture. Personally reviewed the patient's labs which show elevated blood glucose,  initially 516, improved with fluids and subcutaneous insulin.  Patient with hyponatremia in the setting of high blood glucose.  Patient's has a negative pregnancy test, White blood cell count of 11.9.  Patient has 2 - troponins over 2 hours.  EKG shows early repole without any acute changes. Pain improved.  I have no concern for pulmonary embolus.  This is likely recurrence of her costochondritis.  Patient appears appropriate for discharge at this time.  Discussed return precautions. Final Clinical Impression(s) / ED Diagnoses Final diagnoses:  Atypical chest pain    Rx / DC Orders ED Discharge Orders         Ordered    celecoxib (CELEBREX) 100 MG capsule  2 times daily     01/24/20 0519           Margarita Mail, PA-C 01/24/20 0706    Fatima Blank, MD 01/24/20 [phone]

## 2020-01-24 NOTE — ED Provider Notes (Signed)
Briefly, the patient is a 44 y.o. female with h/o diabetes, here for right-sided chest.  Reproducible and worse with movement.   Vitals:   01/24/20 0245 01/24/20 0517  BP: 125/75 118/71  Pulse: 95 96  Resp: 14 11  Temp:    SpO2: 100% 94%    CONSTITUTIONAL: Chronically ill-appearing, NAD NEURO:  Alert and oriented x 3, no focal deficits EYES:  pupils equal and reactive ENT/NECK:  trachea midline, no JVD CARDIO: Regular rate, regular rhythm, well-perfused PULM: None labored breathing GI/GU:  Abdomin non-distended MSK/SPINE: Left BKA SKIN:  no rash, atraumatic PSYCH:  Appropriate speech and behavior  Work up reassuring EKG and negative serial troponins.  Low suspicion for PE.  Presentation not classic for dissection or esophageal perforation.  Work-up notable for hyperglycemia without evidence of DKA.  Provided with IV fluids and insulin.  Blood sugars improved.  The patient appears reasonably screened and/or stabilized for discharge and I doubt any other medical condition or other [facility] requiring further screening, evaluation, or treatment in the ED at this time prior to discharge. Safe for discharge with strict return precautions.      Fatima Blank, MD 01/24/20 [phone]

## 2020-01-24 NOTE — ED Notes (Addendum)
Offered pt blanket, pt states she didn't need one. Pt sighing loudly multiple times and states "when will my ride be here?" Informed pt that this RN is not sure due to the roads and icy conditions and that there are multiple people waiting for ptar transport. "Just move my stuff". Moved bedside table next to pt so pt could put her stuff on the table herself. Will continue to monitor.

## 2020-01-24 NOTE — ED Notes (Signed)
Patient is resting comfortably. 

## 2020-01-24 NOTE — ED Notes (Signed)
Pt sighing loudly over and over. When asked if she needed anything, pt denies needing anything. "I just want to go".

## 2020-01-25 ENCOUNTER — Encounter: Payer: Medicaid Other | Admitting: Physical Therapy

## 2020-01-25 DIAGNOSIS — E1140 Type 2 diabetes mellitus with diabetic neuropathy, unspecified: Secondary | ICD-10-CM | POA: Diagnosis not present

## 2020-01-26 DIAGNOSIS — E1140 Type 2 diabetes mellitus with diabetic neuropathy, unspecified: Secondary | ICD-10-CM | POA: Diagnosis not present

## 2020-01-27 DIAGNOSIS — E1140 Type 2 diabetes mellitus with diabetic neuropathy, unspecified: Secondary | ICD-10-CM | POA: Diagnosis not present

## 2020-01-28 DIAGNOSIS — G4733 Obstructive sleep apnea (adult) (pediatric): Secondary | ICD-10-CM | POA: Diagnosis not present

## 2020-01-28 DIAGNOSIS — J45909 Unspecified asthma, uncomplicated: Secondary | ICD-10-CM | POA: Diagnosis not present

## 2020-01-28 DIAGNOSIS — E1140 Type 2 diabetes mellitus with diabetic neuropathy, unspecified: Secondary | ICD-10-CM | POA: Diagnosis not present

## 2020-01-29 ENCOUNTER — Encounter: Payer: Medicaid Other | Admitting: Physical Therapy

## 2020-01-29 DIAGNOSIS — E1140 Type 2 diabetes mellitus with diabetic neuropathy, unspecified: Secondary | ICD-10-CM | POA: Diagnosis not present

## 2020-01-30 ENCOUNTER — Encounter: Payer: Self-pay | Admitting: Physical Therapy

## 2020-01-30 ENCOUNTER — Ambulatory Visit: Payer: Medicaid Other | Attending: Family Medicine | Admitting: Physical Therapy

## 2020-01-30 ENCOUNTER — Other Ambulatory Visit: Payer: Self-pay

## 2020-01-30 DIAGNOSIS — R2689 Other abnormalities of gait and mobility: Secondary | ICD-10-CM

## 2020-01-30 DIAGNOSIS — M21371 Foot drop, right foot: Secondary | ICD-10-CM | POA: Diagnosis not present

## 2020-01-30 DIAGNOSIS — M79604 Pain in right leg: Secondary | ICD-10-CM

## 2020-01-30 DIAGNOSIS — M25651 Stiffness of right hip, not elsewhere classified: Secondary | ICD-10-CM

## 2020-01-30 DIAGNOSIS — R293 Abnormal posture: Secondary | ICD-10-CM | POA: Diagnosis not present

## 2020-01-30 DIAGNOSIS — M25661 Stiffness of right knee, not elsewhere classified: Secondary | ICD-10-CM | POA: Insufficient documentation

## 2020-01-30 DIAGNOSIS — E1140 Type 2 diabetes mellitus with diabetic neuropathy, unspecified: Secondary | ICD-10-CM | POA: Diagnosis not present

## 2020-01-30 DIAGNOSIS — R2681 Unsteadiness on feet: Secondary | ICD-10-CM | POA: Diagnosis not present

## 2020-01-30 DIAGNOSIS — M79605 Pain in left leg: Secondary | ICD-10-CM

## 2020-01-30 DIAGNOSIS — Z9181 History of falling: Secondary | ICD-10-CM | POA: Diagnosis not present

## 2020-01-30 DIAGNOSIS — M6281 Muscle weakness (generalized): Secondary | ICD-10-CM | POA: Diagnosis not present

## 2020-01-30 DIAGNOSIS — M25652 Stiffness of left hip, not elsewhere classified: Secondary | ICD-10-CM

## 2020-01-30 NOTE — Therapy (Signed)
[facility] [address] [place], [place], [address] Phone: [phone]   Fax:  [phone]  Physical Therapy Treatment  Patient Details  Name: [name] MRN: [id number] Date of Birth: [birth year] Referring Provider (PT): [name], MD   Encounter Date: 01/30/2020  PT End of Session - 01/30/20 [id number]    Visit Number  8    Number of Visits  21    Authorization Type  Medicaid    Authorization Time Period  12 visits 01/23/2020 - 04/15/2020    Authorization - Visit Number  1    Authorization - Number of Visits  12    PT Start Time  [id number]    PT Stop Time  1400    PT Time Calculation (min)  55 min    Equipment Utilized During Treatment  Gait belt    Activity Tolerance  Patient tolerated treatment well;Patient limited by fatigue    Behavior During Therapy  [facility] for tasks assessed/performed       Past Medical History:  Diagnosis Date  . Acute osteomyelitis of ankle or foot, left ([place]) 01/31/2018  . Alveolar hypoventilation   . Anemia    not on iron pill  . Asthma   . Bipolar 2 disorder ([place])   . Carpal tunnel syndrome on right    recurrent  . Cellulitis 08/2010-08/2011  . Chronic pain   . COPD (chronic obstructive pulmonary disease) ([name])    Symbicort daily and Proventil as needed  . Costochondritis   . Depression   . Diabetes mellitus type II, uncontrolled ([place]) 2000   Type 2, Uncontrolled.Takes Lantus daily.Fasting blood sugar runs 150  . Dizziness    occasionally  . Drug-seeking behavior   . GERD (gastroesophageal reflux disease)    takes Pantoprazole and Zantac daily  . Headache    migraine-last one about a yr ago.Topamax daily  . HLD (hyperlipidemia)    takes Atorvastatin daily  . Hypertension    takes Lisinopril and Coreg daily  . Morbid obesity ([place])   . Muscle spasm    takes Flexeril as needed  . Nocturia   . OSA on CPAP   . Peripheral neuropathy    takes Gabapentin daily  . Pneumonia     "walking" several yrs ago and as a baby (12/05/2018)  . Rectal fissure   . Restless leg   . Syncope 02/25/2016  . Urinary frequency   . Varicose veins    Right medial thigh and Left leg     Past Surgical History:  Procedure Laterality Date  . AMPUTATION Left 02/01/2018   Procedure: LEFT FOURTH AND 5TH TOE RAY AMPUTATION;  Surgeon: [name], MD;  Location: [facility];  Service: Orthopedics;  Laterality: Left;  . AMPUTATION Left 03/03/2018   Procedure: LEFT BELOW KNEE AMPUTATION;  Surgeon: [name], MD;  Location: [facility];  Service: Orthopedics;  Laterality: Left;  . CARPAL TUNNEL RELEASE Bilateral   . CESAREAN SECTION  2007  . INCISION AND DRAINAGE PERIRECTAL ABSCESS Left 05/18/2019   Procedure: IRRIGATION AND DEBRIDEMENT OF PANNIS ABSCESS, POSSIBLE DEBRIDEMENT OF BUTTOCK WOUND;  Surgeon: Donnie Mesa, MD;  Location: [facility];  Service: General;  Laterality: Left;  . IRRIGATION AND DEBRIDEMENT BUTTOCKS Left 05/17/2019   Procedure: IRRIGATION AND DEBRIDEMENT BUTTOCKS;  Surgeon: Donnie Mesa, MD;  Location: [facility];  Service: General;  Laterality: Left;  . KNEE ARTHROSCOPY Right 07/17/2010  . LEFT HEART CATHETERIZATION WITH CORONARY ANGIOGRAM N/A 07/27/2012   Procedure: LEFT  HEART CATHETERIZATION WITH CORONARY ANGIOGRAM;  Surgeon: Sherren Mocha, MD;  Location: [facility] CATH LAB;  Service: Cardiovascular;  Laterality: N/A;  . MASS EXCISION N/A 06/29/2013   Procedure:  WIDE LOCAL EXCISION OF POSTERIOR NECK ABSCESS;  Surgeon: Ralene Ok, MD;  Location: [facility];  Service: General;  Laterality: N/A;  . REPAIR KNEE LIGAMENT Left    "fixed ligaments and chipped patella"  . right transmetatarsal amputation       There were no vitals filed for this visit.  Subjective Assessment - 01/30/20 1306    Subjective  [facility] does not have the notes from Dr. [name] to get approval from [facility]. She went to ED on 2/18 due to chest pain and MD says inflammation of cartilage. She has been out of bed  every day prior to 2/12 from arising to bed time with prosthesis on limb. Since 2/12 when chest pain issues started, she has been out of bed up to 4hrs total but wearing liner in bed.    Pertinent History  Rt Transmetatarsal Amputation, LTTA, OA, COPD, asthma, uncontrolled DM2, depression, Neuropathy, Bipolar, carpal tunnel, morbid obesity, syncope    Limitations  Lifting;Standing;Walking;House hold activities    Patient Stated Goals  To walk in house & community    Currently in Pain?  Yes    Pain Score  5     Pain Location  Leg    Pain Orientation  Right;Left    Pain Descriptors / Indicators  Aching;Sore    Pain Type  Chronic pain;Neuropathic pain    Pain Onset  More than a month ago    Pain Frequency  Constant    Aggravating Factors   neuropathy    Pain Relieving Factors  medications         [facility] PT Assessment - 01/30/20 1310      Assessment   Medical Diagnosis  Deconditioning left transtibial Amputation / right Transmetatarsal amputation      AROM   Right Ankle Dorsiflexion  -18      PROM   Right Ankle Dorsiflexion  -8      Static Standing Balance   Static Standing - Balance Support  Bilateral upper extremity supported;No upper extremity supported    Static Standing - Level of Assistance  4: Min assist;5: Stand by assistance    Static Standing - Comment/# of Minutes  Without AFO on RLE: RW support supervision but excessive weight bearing on RW & without UE support 2 seconds only with minA.  With AFO on RLE, with RW support supervision with moderate weight bearing on RW and without UE support 5 seconds with minA.       Dynamic Standing Balance   Dynamic Standing - Balance Support  Bilateral upper extremity supported;Left upper extremity supported    Dynamic Standing - Level of Assistance  3: Mod assist;4: Min assist    Dynamic Standing - Comments  Without AFO RLE: scans with BUE support excessive weight bearing on RW and only moves head to look right /left or up/down;  reaches  2" anteriorly with dominant RUE with modA.  With AFO on RLE:  scans with BUE support on RW with supervision using cervical & upper trunk motion to look right/left & up/down;  reaches 5" with dominant RUE (LUE on RW) with min guard.                     [facility] Adult PT Treatment/Exercise - 01/30/20 1310      Transfers  Transfers  Sit to Stand;Stand to Sit    Sit to Stand  5: Supervision;4: Min assist;With upper extremity assist;With armrests;From chair/3-in-1   to RW,  see details note   Sit to Stand Details  --    Sit to Stand Details (indicate cue type and reason)  Without AFO on RLE minA due to pt unable to control anterior weight shift / stabilization.  With AFO, she was able to safely stabilize with RW.    Stand to Sit  5: Supervision;With upper extremity assist;To chair/3-in-1;Other (comment);With armrests   from RW, see details tab   Stand to Sit Details (indicate cue type and reason)  --    Stand to Sit Details  Without AFO on RLE, needs min guard to stabilize when released RW to reach for w/c. With AFO, she was able to stablize when releasing RW to reach for w/c.     Comments  --      Ambulation/Gait   Ambulation/Gait  Yes    Ambulation/Gait Assistance  4: Min assist;4: Min guard   MinA without AFO RLE & Min Guard with AFO RLE   Ambulation/Gait Assistance Details  PT assessed gait with & without AFO (Ground reaction dynamic response) on RLE which has Transmetatarsal Amputation.  Without AFO: her right knee buckles, increased anterior trunk lean and increased weight bearing on RW.  With AFO she required less assistance from PT, increased stance stability on RLE, less forward lean and less UE weight bearing on RW.  She also tolerated 4x the distance 20' vs 5'    Ambulation Distance (Feet)  20 Feet   5' without AFO & 20' with AFO RLE   Assistive device  Rolling walker;Prosthesis;Other (Comment)   AFO RLE ground reaction dynamic response   Gait Pattern  Step-to  pattern;Decreased stride length;Decreased dorsiflexion - right;Decreased weight shift to left;Right foot flat;Antalgic;Lateral hip instability;Trunk flexed;Decreased step length - right;Decreased stance time - left;Right flexed knee in stance;Left flexed knee in stance    Ambulation Surface  Indoor;Level      Prosthetics   Prosthetic Care Comments   --    Current prosthetic wear tolerance (days/week)   daily    Current prosthetic wear tolerance (#hours/day)   Prosthetic liner most of awake hours weather in or out of bed and prosthesis when out of bed. She reports increased time out of bed prior to recent chest pain issues.     Current prosthetic weight-bearing tolerance (hours/day)   Pt tolerated 3 minutes of standing and short bouts of gait. No reports of limb pain on LLE with weight bearing. Right anterior partial foot amputation has pain 5/10 distal medial area. No skin breakdown.     Edema  Pitting edema in RLE calf area. Only mild edema in left residual limb.     Residual limb condition   Left TTA no open areas, normal color. Right Transmetatarsal Amputation has dry callous distally with increased hardness at 5th & 1st metatarsal heads.  Tenderness at 1st & 5th Metatarsal heads. Redness on dorsum of foot.     Education Provided  Residual limb care;Proper wear schedule/adjustment;Proper weight-bearing schedule/adjustment;Skin check    Person(s) Educated  Patient;Child(ren)    Education Method  Explanation;Demonstration;Tactile cues;Verbal cues    Education Method  Verbalized understanding;Verbal cues required;Needs further instruction               PT Short Term Goals - 01/30/20 2207      PT SHORT TERM GOAL #1   Title  Patient verbalizes consistant wear of prosthesis for 6 hours/daily. (All updated STGs target date 02/13/2020)    Time  1    Period  Months    Status  On-going    Target Date  02/13/20      PT SHORT TERM GOAL #2   Title  Patient tolerates 3 minutes of standing  activities with UE support with vital signs WNL.    Baseline  Patient tolerates 1 minute of standing activities with UE support with supervision. Vital signs WNL but dyspnea.    Time  1    Period  Months    Status  On-going    Target Date  02/13/20      PT SHORT TERM GOAL #3   Title  Patient able to reach to floor with RW support with supervision.    Baseline  12/05/19: Patient reaches within 7.5" of floor with RW support with minA.    Time  3    Period  Weeks    Status  On-going    Target Date  02/13/20      PT SHORT TERM GOAL #4   Title  Patient ambulates 61' with RW & prosthesis and personal GrAFO with supervision.    Baseline  12/05/19: Patient ambulated 60' with RW & prosthesis & clinic's GrAFO with minA., longest distance during cert period = 40'    Time  3    Period  Weeks    Status  On-going    Target Date  02/13/20        PT Long Term Goals - 01/16/20 1510      PT LONG TERM GOAL #1   Title  Patient verbalizes & demonstrates proper orthotic care and maintains current mod-I prosthetic care ability to enable safe use of orthosis & prosthesis. (All LTGs Target Date 04/11/2020)    Baseline  12/05/2019  Patient is scheduled to receive her first AFO on 12/11/2018 and is dependent in care / use.    Time  12    Period  Weeks    Status  On-going    Target Date  04/11/20      PT LONG TERM GOAL #2   Title  Patient tolerates prosthetic liner wear for >90% and prosthetic wear >50% of awake hours without skin integrity issues for improved mobility.    Baseline  12/05/19: wears liner most wake hours 8-10, wears prosthesis 4-6 hours daily. She is scheduled to receive AFO on 12/11/2018 so no wear yet.    Time  12    Period  Weeks    Status  On-going    Target Date  04/11/20      PT LONG TERM GOAL #3   Title  Patient reports pain increases </= 3 increments with standing & gait increased activities noted in LTGs with RW, prosthesis, and orthosis.    Baseline  12/05/19: Patient reports  bilateral knee pain up to 8/10 with prosthesis wear, standing & gait with baseline at lowest 5/10    Time  13    Period  Weeks    Status  On-going    Target Date  04/11/20      PT LONG TERM GOAL #4   Title  Standing balance with RW support reaching 10" anteriorly, picking up objects from floor & scanning environment safely modified independent.    Baseline  12/05/19: Standing balance with RW support with moderate assist reaching 10" anteriorly, pick up object on floor 7.5", able to look over bilateral shoulder  with wt shift with supervision-minA    Time  13    Period  Weeks    Status  On-going    Target Date  04/11/20      PT LONG TERM GOAL #5   Title  Patient ambulates 48' with RW & prosthesis & orthosis modified independent to enable basic community entry.    Baseline  12/05/19: Patient ambulates 46' with RW & prosthesis & clinic's GrAFO with minA    Time  13    Period  Weeks    Status  On-going    Target Date  04/11/20      PT LONG TERM GOAL #6   Title  Patient negotiates ramp & curb with RW & prosthesis & orthosis minA from family to enable community access.    Baseline  12/05/19: Patient is non-ambulatory on stairs, ramps & curbs limiting community mobility.    Time  13    Period  Weeks    Status  On-going    Target Date  04/11/20            Plan - 01/30/20 2208    Clinical Impression Statement  PT compared standing balance and gait without & with AFO on right Transmetatarsal Amputation. Her standing balance with rolling walker was significantly improved with AFO in that reaches 5" with supervision versus 2" with minA, scans with upper trunk & cervical motion versus only cervical and increased stability with simulated managing pants for toileting.  Her gait is improved with AFO on right leg and Transtibial Amputation prosthesis on left leg. She was more stable with AFO and ambulated 20' versus 5' without AFO.  Patient's right ankle PROM & AROM with gastroc tightness,  callousing & skin changes at distal residual limb and tenderness from increased pressure to distal limb with standing & gait activities. This indicates higher risk for breakdown of her partial foot amputation and could be potential for further amputation. The ground reaction AFO would decrease risk of pressure and issues to distal limb of Transmetatarsal Amputation. With partial foot amputation on one side and Transtibial Amputation on other side, the AFO would provide improved anterior stability for balance & gait.    Personal Factors and Comorbidities  Comorbidity 3+;Fitness;Past/Current Experience;Finances;Time since onset of injury/illness/exacerbation    Comorbidities  left Transtibial amputation, right Transmetatarsal amputation, OA, asthma, DM, depression, neuropathy, Bipolar    Examination-Activity Limitations  Bed Mobility;Caring for Others;Carry;Lift;Locomotion Level;Stairs;Stand;Transfers    Examination-Participation Restrictions  Community Activity;Driving;Meal Prep;Other   parenting 42yo dtr   Stability/Clinical Decision Making  Evolving/Moderate complexity    Rehab Potential  Good    PT Frequency  1x / week   1x/wk for 3 weeks, then 2x/wk for 6 weeks   PT Duration  12 weeks    PT Treatment/Interventions  ADLs/Self Care Home Management;DME Instruction;Gait training;Stair training;Functional mobility training;Therapeutic activities;Therapeutic exercise;Balance training;Neuromuscular re-education;Patient/family education;Prosthetic Training;Orthotic Fit/Training;Vestibular    PT Next Visit Plan  work towards STGs, prosthetic gait & standing balance with RW    Consulted and Agree with Plan of Care  Patient       Patient will benefit from skilled therapeutic intervention in order to improve the following deficits and impairments:  Abnormal gait, Decreased activity tolerance, Decreased balance, Decreased endurance, Decreased knowledge of use of DME, Decreased mobility, Decreased range of  motion, Decreased strength, Dizziness, Impaired flexibility, Postural dysfunction, Prosthetic Dependency, Obesity, Pain  Visit Diagnosis: Unsteadiness on feet  Other abnormalities of gait and mobility  Abnormal posture  Muscle weakness  Pain in right leg  Muscle weakness (generalized)  Foot drop, right  Stiffness of right hip, not elsewhere classified  Stiffness of left hip, not elsewhere classified  Stiffness of right knee, not elsewhere classified  Pain in left leg  History of falling     Problem List Patient Active Problem List   Diagnosis Date Noted  . UTI (urinary tract infection) 06/28/2019  . Right hand pain 06/23/2019  . Amputation above knee ([place]) 06/23/2019  . Left shoulder pain 06/23/2019  . Dysuria 06/14/2019  . Abdominal pain   . Fever   . Abscess of skin of abdomen   . Abscess of buttock, right 05/17/2019  . Hyperglycemia   . Severe protein-calorie malnutrition ([place])   . Amputated toe, right ([place]) 04/11/2019  . 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[place]) 04/11/2019  . Hypertriglyceridemia 04/11/2019  . Type 2 diabetes mellitus with diabetic polyneuropathy, with long-term current use of insulin ([place]) 04/11/2019  . Type 2 diabetes mellitus with hyperglycemia, with long-term current use of insulin ([place]) 04/11/2019  . Infection of right foot   . Cellulitis of great toe of right foot 10/29/2018  . Osteomyelitis of great toe of right foot ([place]) 10/29/2018  . Encounter for orthopedic aftercare following surgical amputation 06/28/2018  . Uncontrolled type 2 diabetes mellitus with insulin therapy ([place]) 04/16/2018  . Allergic rhinitis 03/06/2018  . Unilateral complete BKA, left, sequela ([place])   . Class 3 severe obesity due to excess calories with serious comorbidity and body mass index (BMI) of 50.0 to 59.9 in adult [facility])   . AKI (acute kidney injury) ([place])   . Decreased pedal pulses 06/10/2017  . Unilateral primary osteoarthritis, right knee 10/22/2016  . Primary  osteoarthritis of first carpometacarpal joint of left hand 07/30/2016  . Diabetic neuropathy ([place]) 07/14/2016  . Nausea 03/17/2016  . De Quervain's tenosynovitis, bilateral 11/01/2015  . Vitamin D deficiency 09/05/2015  . Recurrent candidiasis of vagina 09/05/2015  . Varicose veins of leg with complications [id number]  . Restless leg syndrome 10/17/2014  . Chronic sinusitis 07/18/2014  . Headache 07/15/2014  . Encounter for chronic pain management 06/30/2013  . HLD (hyperlipidemia) 11/19/2012  . Chest pain 06/27/2012  . Right carpal tunnel syndrome 09/01/2011  . Bilateral knee pain 09/01/2011  . DM (diabetes mellitus) type II uncontrolled, periph vascular disorder ([place]) 05/22/2008  . Morbid obesity ([place]) 05/22/2008  . 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 Depression 05/22/2008  . Obstructive sleep apnea 05/22/2008  . Hypertension 05/22/2008  . Asthma 05/22/2008  . GERD 05/22/2008    Jamey Reas  PT, DPT 01/30/2020, 10:24 PM  [facility] [address] [place], [place], [address] Phone: [phone]   Fax:  [phone]  Name: [name] MRN: [id number] Date of Birth: 30-May-1976

## 2020-01-31 ENCOUNTER — Encounter: Payer: Medicaid Other | Admitting: Physical Therapy

## 2020-01-31 DIAGNOSIS — E1140 Type 2 diabetes mellitus with diabetic neuropathy, unspecified: Secondary | ICD-10-CM | POA: Diagnosis not present

## 2020-02-01 ENCOUNTER — Encounter: Payer: Medicaid Other | Admitting: Physical Therapy

## 2020-02-01 DIAGNOSIS — E1140 Type 2 diabetes mellitus with diabetic neuropathy, unspecified: Secondary | ICD-10-CM | POA: Diagnosis not present

## 2020-02-02 DIAGNOSIS — E1140 Type 2 diabetes mellitus with diabetic neuropathy, unspecified: Secondary | ICD-10-CM | POA: Diagnosis not present

## 2020-02-03 DIAGNOSIS — E1140 Type 2 diabetes mellitus with diabetic neuropathy, unspecified: Secondary | ICD-10-CM | POA: Diagnosis not present

## 2020-02-05 ENCOUNTER — Ambulatory Visit: Payer: Medicaid Other | Admitting: Family Medicine

## 2020-02-05 ENCOUNTER — Encounter: Payer: Medicaid Other | Admitting: Physical Therapy

## 2020-02-05 DIAGNOSIS — E1140 Type 2 diabetes mellitus with diabetic neuropathy, unspecified: Secondary | ICD-10-CM | POA: Diagnosis not present

## 2020-02-06 ENCOUNTER — Ambulatory Visit: Payer: Medicaid Other | Attending: Family Medicine | Admitting: Physical Therapy

## 2020-02-06 DIAGNOSIS — M6281 Muscle weakness (generalized): Secondary | ICD-10-CM | POA: Insufficient documentation

## 2020-02-06 DIAGNOSIS — M25661 Stiffness of right knee, not elsewhere classified: Secondary | ICD-10-CM | POA: Insufficient documentation

## 2020-02-06 DIAGNOSIS — M25652 Stiffness of left hip, not elsewhere classified: Secondary | ICD-10-CM | POA: Insufficient documentation

## 2020-02-06 DIAGNOSIS — R2689 Other abnormalities of gait and mobility: Secondary | ICD-10-CM | POA: Insufficient documentation

## 2020-02-06 DIAGNOSIS — M21371 Foot drop, right foot: Secondary | ICD-10-CM | POA: Insufficient documentation

## 2020-02-06 DIAGNOSIS — E1140 Type 2 diabetes mellitus with diabetic neuropathy, unspecified: Secondary | ICD-10-CM | POA: Diagnosis not present

## 2020-02-06 DIAGNOSIS — R293 Abnormal posture: Secondary | ICD-10-CM | POA: Insufficient documentation

## 2020-02-06 DIAGNOSIS — M79605 Pain in left leg: Secondary | ICD-10-CM | POA: Insufficient documentation

## 2020-02-06 DIAGNOSIS — M79604 Pain in right leg: Secondary | ICD-10-CM | POA: Insufficient documentation

## 2020-02-06 DIAGNOSIS — R2681 Unsteadiness on feet: Secondary | ICD-10-CM | POA: Insufficient documentation

## 2020-02-06 DIAGNOSIS — M25651 Stiffness of right hip, not elsewhere classified: Secondary | ICD-10-CM | POA: Insufficient documentation

## 2020-02-07 ENCOUNTER — Encounter: Payer: Medicaid Other | Admitting: Physical Therapy

## 2020-02-07 DIAGNOSIS — E1140 Type 2 diabetes mellitus with diabetic neuropathy, unspecified: Secondary | ICD-10-CM | POA: Diagnosis not present

## 2020-02-08 ENCOUNTER — Encounter: Payer: Medicaid Other | Admitting: Physical Therapy

## 2020-02-08 ENCOUNTER — Other Ambulatory Visit: Payer: Self-pay

## 2020-02-08 DIAGNOSIS — E1140 Type 2 diabetes mellitus with diabetic neuropathy, unspecified: Secondary | ICD-10-CM | POA: Diagnosis not present

## 2020-02-08 MED ORDER — HYDROCODONE-ACETAMINOPHEN 10-325 MG PO TABS: 1.0000 | ORAL_TABLET | Freq: Three times a day (TID) | ORAL | 0 refills | Status: DC | PRN

## 2020-02-08 NOTE — Telephone Encounter (Signed)
Patient calls nurse line stating she missed her apt with PCP, due to being out of town for her birthday. Patient stated she rescheduled for 03/09. Patient stated she is out of her pain medication as of last night. Patient is requesting enough pills to get her to next weeks apt. Please advise.

## 2020-02-08 NOTE — Telephone Encounter (Signed)
Pdmp reviewed with appropriate findings Will refill, patient should keep appointment with me for next week.  [name], MD

## 2020-02-09 DIAGNOSIS — E1140 Type 2 diabetes mellitus with diabetic neuropathy, unspecified: Secondary | ICD-10-CM | POA: Diagnosis not present

## 2020-02-10 DIAGNOSIS — E1140 Type 2 diabetes mellitus with diabetic neuropathy, unspecified: Secondary | ICD-10-CM | POA: Diagnosis not present

## 2020-02-11 DIAGNOSIS — E1140 Type 2 diabetes mellitus with diabetic neuropathy, unspecified: Secondary | ICD-10-CM | POA: Diagnosis not present

## 2020-02-12 ENCOUNTER — Encounter: Payer: Self-pay | Admitting: Family Medicine

## 2020-02-12 ENCOUNTER — Other Ambulatory Visit: Payer: Self-pay

## 2020-02-12 ENCOUNTER — Ambulatory Visit (INDEPENDENT_AMBULATORY_CARE_PROVIDER_SITE_OTHER): Payer: Medicaid Other | Admitting: Family Medicine

## 2020-02-12 ENCOUNTER — Encounter: Payer: Medicaid Other | Admitting: Physical Therapy

## 2020-02-12 ENCOUNTER — Other Ambulatory Visit: Payer: Self-pay | Admitting: *Deleted

## 2020-02-12 DIAGNOSIS — G2581 Restless legs syndrome: Secondary | ICD-10-CM

## 2020-02-12 DIAGNOSIS — Z89431 Acquired absence of right foot: Secondary | ICD-10-CM | POA: Diagnosis not present

## 2020-02-12 DIAGNOSIS — E1151 Type 2 diabetes mellitus with diabetic peripheral angiopathy without gangrene: Secondary | ICD-10-CM | POA: Diagnosis not present

## 2020-02-12 DIAGNOSIS — B373 Candidiasis of vulva and vagina: Secondary | ICD-10-CM | POA: Diagnosis not present

## 2020-02-12 DIAGNOSIS — G8929 Other chronic pain: Secondary | ICD-10-CM

## 2020-02-12 DIAGNOSIS — G4733 Obstructive sleep apnea (adult) (pediatric): Secondary | ICD-10-CM | POA: Diagnosis not present

## 2020-02-12 DIAGNOSIS — E1165 Type 2 diabetes mellitus with hyperglycemia: Secondary | ICD-10-CM

## 2020-02-12 DIAGNOSIS — B3731 Acute candidiasis of vulva and vagina: Secondary | ICD-10-CM

## 2020-02-12 DIAGNOSIS — IMO0002 Reserved for concepts with insufficient information to code with codable children: Secondary | ICD-10-CM

## 2020-02-12 MED ORDER — TERCONAZOLE 0.4 % VA CREA: 1.0000 | TOPICAL_CREAM | Freq: Every day | VAGINAL | 0 refills | Status: DC

## 2020-02-12 MED ORDER — INSULIN ISOPHANE & REGULAR (HUMAN 70-30)100 UNIT/ML KWIKPEN: PEN_INJECTOR | SUBCUTANEOUS | Status: DC

## 2020-02-12 MED ORDER — ROPINIROLE HCL 0.5 MG PO TABS: 0.5000 mg | ORAL_TABLET | Freq: Every day | ORAL | 1 refills | Status: DC

## 2020-02-12 MED ORDER — ACCU-CHEK SOFTCLIX LANCETS MISC: 12 refills | Status: DC

## 2020-02-12 MED ORDER — HYDROCODONE-ACETAMINOPHEN 10-325 MG PO TABS: ORAL_TABLET | ORAL | 0 refills | Status: DC

## 2020-02-12 NOTE — Patient Instructions (Signed)
Someone should call you about the sleep study  Sent in refills on pain medication, ropinirole, lancets, terconazole  Go up on insulin to 75u in AM and 40 u in PM Will get Dr. Graylin Shiver team to call you  Will send note to [facility] about the foot brace  Follow up with me in 1 month, sooner if needed  Be well, Dr. [name]

## 2020-02-12 NOTE — Progress Notes (Signed)
Date of Visit: 02/12/2020   HPI:  [name] presents for a follow up.  Chronic Pain: currently taking Hydrocodone 10-325mg  three times daily. She needs a refill on this medication. Last dose 3/4, she ran out and did not get called that her new rx was ready. There was also apparently an issue with her medicaid needing a prior auth for this refill. No difficulty controlling how often she is taking the medication. It helps when she takes it. No excessive sedation.  Abdominal pain: pain in skin, recent onset, over R side of upper abdomen. Has not noticed any rash.   Sleep Apnea: would like updated CPAP titration study. I ordered this during her last telemedicine visit but she has not been contacted about it yet.  Diabetes: Having trouble getting in touch with her endocrinologist to schedule appointment. Blood sugars are remaining very elevated on current insulin regimen. Currently taking 70/30 - takes 70 u in AM and 35 u in PM. Has noticed blurry vision associated with high sugar readings. Needs refill on accu-check softclix lancets.  Foot brace: Receiving therapy for R foot and was recommended for a foot brace. Needs this documented for medicaid purposes.  Recurrent vaginal candidiasis - has upcoming appointment with GYN but requests refill of Terconazole cream 0.4% until she sees GYN. Uses it nightly on exterior genitalia. Has some itching.  Restless leg syndrome - started ropinirole 0.25mg  after last visit. R leg is no longer aching but continues to feel burning pain at night. Feels symptoms could be better controlled.  [place]:  History of asthma, depression, diabetic neuropathy, chronic pain, type 2 DM, hyperlipidemia, prior BKA & TMA, HTN  PHYSICAL EXAM: BP 128/80   Pulse 91   SpO2 99%  Gen: NAD, pleasant, cooperative HEENT: NCAT Heart: normal work of breathing  Abdomen: soft, nontender to palpation. Skin with slight tenderness at T6/T7 distribution on R side anteriorly. No overlying  rashes noted. Neuro: grossly nonfocal, speech normal Extremity: s/p L BKA. R foot demonstrates transmetatarsal amputation with foot drop. Also has some knee discomfort and instability with mobility of knee.   ASSESSMENT/PLAN:  Encounter for chronic pain management PDMP reviewed, appropriate findings. Benefits outweigh risks. Clarified receipt of Hydrocodone prescription with pharmacy - ready for pick up. One additional refill sent in. She will follow up with me in 1 month.  DM (diabetes mellitus) type II uncontrolled, periph vascular disorder ([place]) uncontrolled. Increase insulin 70/30 to 75u in AM and 40u in PM. Will also touch base with pharmacy team to see if they can assist with titrating insulin  Obstructive sleep apnea will route message about scheduling CPAP titration study that was previously ordered  Restless leg syndrome Uncontrolled. increase Ropinirole to 0.5mg  nightly. Follow up in 1 month   Recurrent candidiasis of vagina send refill of Terconazole cream 0.4% to pharmacy to last until GYN appointment   Status post transmetatarsal amputation of right foot ([place]) Safety of mobility is limited by foot drop and knee instability. It is necessary for patient to have a right AFO brace in order to aid with foot clearance, in order for her to ambulate safely. The AFO will improve foot clearance, mediolateral control, and knee and foot stability as she ambulates.   Skin tenderness on abdomen - unclear etiology, could represent early shingles. Instructed to keep an eye on it and call if rash develops on skin in that area so we could initiate antivirals   FOLLOW UP: Follow up next month for chronic pain and DM  care   Patient seen along with MS3 student [place]. I personally evaluated this patient along with the student, and verified all aspects of the history, physical exam, and medical decision making as documented by the student. I agree with the student's documentation and  have made all necessary edits.  [name], MD [place]

## 2020-02-13 ENCOUNTER — Ambulatory Visit: Payer: Medicaid Other | Admitting: Physical Therapy

## 2020-02-13 DIAGNOSIS — Z89431 Acquired absence of right foot: Secondary | ICD-10-CM | POA: Insufficient documentation

## 2020-02-13 DIAGNOSIS — E1140 Type 2 diabetes mellitus with diabetic neuropathy, unspecified: Secondary | ICD-10-CM | POA: Diagnosis not present

## 2020-02-13 DIAGNOSIS — Z89421 Acquired absence of other right toe(s): Secondary | ICD-10-CM | POA: Insufficient documentation

## 2020-02-13 NOTE — Assessment & Plan Note (Signed)
Uncontrolled. increase Ropinirole to 0.5mg  nightly. Follow up in 1 month

## 2020-02-13 NOTE — Assessment & Plan Note (Signed)
uncontrolled. Increase insulin 70/30 to 75u in AM and 40u in PM. Will also touch base with pharmacy team to see if they can assist with titrating insulin

## 2020-02-13 NOTE — Assessment & Plan Note (Signed)
PDMP reviewed, appropriate findings. Benefits outweigh risks. Clarified receipt of Hydrocodone prescription with pharmacy - ready for pick up. One additional refill sent in. She will follow up with me in 1 month.

## 2020-02-13 NOTE — Assessment & Plan Note (Addendum)
Safety of mobility is limited by foot drop and knee instability. It is necessary for patient to have a right AFO brace in order to aid with foot clearance, in order for her to ambulate safely. The AFO will improve foot clearance, mediolateral control, and knee and foot stability as she ambulates.

## 2020-02-13 NOTE — Assessment & Plan Note (Signed)
will route message about scheduling CPAP titration study that was previously ordered

## 2020-02-13 NOTE — Assessment & Plan Note (Signed)
send refill of Terconazole cream 0.4% to pharmacy to last until GYN appointment

## 2020-02-13 NOTE — Assessment & Plan Note (Signed)
>>  ASSESSMENT AND PLAN FOR INSULIN DEPENDENT TYPE 2 DIABETES MELLITUS ([facility]) WRITTEN ON 02/13/2020  5:21 PM BY Cristobal Advani, [name], MD  uncontrolled. Increase insulin 70/30 to 75u in AM and 40u in PM. Will also touch base with pharmacy team to see if they can assist with titrating insulin

## 2020-02-14 ENCOUNTER — Encounter: Payer: Medicaid Other | Admitting: Physical Therapy

## 2020-02-14 DIAGNOSIS — E1140 Type 2 diabetes mellitus with diabetic neuropathy, unspecified: Secondary | ICD-10-CM | POA: Diagnosis not present

## 2020-02-14 MED ORDER — CYCLOBENZAPRINE HCL 5 MG PO TABS: ORAL_TABLET | ORAL | 1 refills | Status: DC

## 2020-02-15 ENCOUNTER — Other Ambulatory Visit: Payer: Self-pay | Admitting: Family Medicine

## 2020-02-15 ENCOUNTER — Encounter: Payer: Medicaid Other | Admitting: Physical Therapy

## 2020-02-15 ENCOUNTER — Telehealth: Payer: Self-pay | Admitting: Pharmacist

## 2020-02-15 DIAGNOSIS — E1140 Type 2 diabetes mellitus with diabetic neuropathy, unspecified: Secondary | ICD-10-CM | POA: Diagnosis not present

## 2020-02-15 DIAGNOSIS — G4733 Obstructive sleep apnea (adult) (pediatric): Secondary | ICD-10-CM

## 2020-02-15 NOTE — Telephone Encounter (Signed)
Called patient on 02/15/2020 at 11:56 AM and left HIPAA-compliant VM with instructions to call [facility] clinic back   Unsuccessful attempt #1  Plan to discuss DM management and schedule PharmD appt  Thank you for involving pharmacy to assist in providing this patient's care.   [name] Iha, PharmD PGY2 Ambulatory Care Pharmacy Resident

## 2020-02-16 DIAGNOSIS — E1140 Type 2 diabetes mellitus with diabetic neuropathy, unspecified: Secondary | ICD-10-CM | POA: Diagnosis not present

## 2020-02-17 DIAGNOSIS — E1140 Type 2 diabetes mellitus with diabetic neuropathy, unspecified: Secondary | ICD-10-CM | POA: Diagnosis not present

## 2020-02-18 DIAGNOSIS — E1140 Type 2 diabetes mellitus with diabetic neuropathy, unspecified: Secondary | ICD-10-CM | POA: Diagnosis not present

## 2020-02-19 ENCOUNTER — Encounter: Payer: Medicaid Other | Admitting: Physical Therapy

## 2020-02-19 DIAGNOSIS — E1140 Type 2 diabetes mellitus with diabetic neuropathy, unspecified: Secondary | ICD-10-CM | POA: Diagnosis not present

## 2020-02-20 ENCOUNTER — Ambulatory Visit: Payer: Medicaid Other | Admitting: Physical Therapy

## 2020-02-20 ENCOUNTER — Other Ambulatory Visit: Payer: Self-pay

## 2020-02-20 ENCOUNTER — Encounter: Payer: Self-pay | Admitting: Physical Therapy

## 2020-02-20 DIAGNOSIS — R2689 Other abnormalities of gait and mobility: Secondary | ICD-10-CM | POA: Diagnosis not present

## 2020-02-20 DIAGNOSIS — M79604 Pain in right leg: Secondary | ICD-10-CM

## 2020-02-20 DIAGNOSIS — R2681 Unsteadiness on feet: Secondary | ICD-10-CM | POA: Diagnosis not present

## 2020-02-20 DIAGNOSIS — E1140 Type 2 diabetes mellitus with diabetic neuropathy, unspecified: Secondary | ICD-10-CM | POA: Diagnosis not present

## 2020-02-20 DIAGNOSIS — M25661 Stiffness of right knee, not elsewhere classified: Secondary | ICD-10-CM | POA: Diagnosis not present

## 2020-02-20 DIAGNOSIS — M79605 Pain in left leg: Secondary | ICD-10-CM

## 2020-02-20 DIAGNOSIS — M21371 Foot drop, right foot: Secondary | ICD-10-CM | POA: Diagnosis not present

## 2020-02-20 DIAGNOSIS — R293 Abnormal posture: Secondary | ICD-10-CM | POA: Diagnosis not present

## 2020-02-20 DIAGNOSIS — M25651 Stiffness of right hip, not elsewhere classified: Secondary | ICD-10-CM

## 2020-02-20 DIAGNOSIS — M25652 Stiffness of left hip, not elsewhere classified: Secondary | ICD-10-CM | POA: Diagnosis not present

## 2020-02-20 DIAGNOSIS — M6281 Muscle weakness (generalized): Secondary | ICD-10-CM

## 2020-02-20 NOTE — Therapy (Addendum)
[facility] [address] [place], [place], [address] Phone: [phone]   Fax:  [phone]  Physical Therapy Treatment  Patient Details  Name: [name] MRN: [id number] Date of Birth: March 27, 1976 Referring Provider (PT): Belinda Netters, MD   Encounter Date: 02/20/2020  PT End of Session - 02/20/20 1030    Visit Number  9    Number of Visits  21    Authorization Type  Medicaid    Authorization Time Period  12 visits 01/23/2020 - 04/15/2020    Authorization - Visit Number  2    Authorization - Number of Visits  12    PT Start Time  1017    PT Stop Time  1100    PT Time Calculation (min)  43 min    Equipment Utilized During Treatment  Gait belt    Activity Tolerance  Patient tolerated treatment well;Patient limited by fatigue    Behavior During Therapy  WFL for tasks assessed/performed       Past Medical History:  Diagnosis Date  . Acute osteomyelitis of ankle or foot, left ([place]) 01/31/2018  . Alveolar hypoventilation   . Anemia    not on iron pill  . Asthma   . Bipolar 2 disorder ([place])   . Carpal tunnel syndrome on right    recurrent  . Cellulitis 08/2010-08/2011  . Chronic pain   . COPD (chronic obstructive pulmonary disease) ([name])    Symbicort daily and Proventil as needed  . Costochondritis   . Depression   . Diabetes mellitus type II, uncontrolled ([place]) 2000   Type 2, Uncontrolled.Takes Lantus daily.Fasting blood sugar runs 150  . Dizziness    occasionally  . Drug-seeking behavior   . GERD (gastroesophageal reflux disease)    takes Pantoprazole and Zantac daily  . Headache    migraine-last one about a yr ago.Topamax daily  . HLD (hyperlipidemia)    takes Atorvastatin daily  . Hypertension    takes Lisinopril and Coreg daily  . Morbid obesity ([place])   . Muscle spasm    takes Flexeril as needed  . Nocturia   . OSA on CPAP   . Peripheral neuropathy    takes Gabapentin daily  . Pneumonia    "walking" several yrs ago and as a baby (12/05/2018)  . Rectal fissure   . Restless leg   . Syncope 02/25/2016  . Urinary frequency   . Varicose veins    Right medial thigh and Left leg     Past Surgical History:  Procedure Laterality Date  . AMPUTATION Left 02/01/2018   Procedure: LEFT FOURTH AND 5TH TOE RAY AMPUTATION;  Surgeon: Belinda Minion, MD;  Location: [facility];  Service: Orthopedics;  Laterality: Left;  . AMPUTATION Left 03/03/2018   Procedure: LEFT BELOW KNEE AMPUTATION;  Surgeon: Belinda Minion, MD;  Location: [facility];  Service: Orthopedics;  Laterality: Left;  . CARPAL TUNNEL RELEASE Bilateral   . CESAREAN SECTION  2007  . INCISION AND DRAINAGE PERIRECTAL ABSCESS Left 05/18/2019   Procedure: IRRIGATION AND DEBRIDEMENT OF PANNIS ABSCESS, POSSIBLE DEBRIDEMENT OF BUTTOCK WOUND;  Surgeon: Belinda Mesa, MD;  Location: [facility];  Service: General;  Laterality: Left;  . IRRIGATION AND DEBRIDEMENT BUTTOCKS Left 05/17/2019   Procedure: IRRIGATION AND DEBRIDEMENT BUTTOCKS;  Surgeon: Belinda Mesa, MD;  Location: [facility];  Service: General;  Laterality: Left;  . KNEE ARTHROSCOPY Right 07/17/2010  . LEFT HEART CATHETERIZATION WITH CORONARY ANGIOGRAM N/A 07/27/2012   Procedure: LEFT HEART  CATHETERIZATION WITH CORONARY ANGIOGRAM;  Surgeon: Belinda Mocha, MD;  Location: [facility] CATH LAB;  Service: Cardiovascular;  Laterality: N/A;  . MASS EXCISION N/A 06/29/2013   Procedure:  WIDE LOCAL EXCISION OF POSTERIOR NECK ABSCESS;  Surgeon: Belinda Ok, MD;  Location: [facility];  Service: General;  Laterality: N/A;  . REPAIR KNEE LIGAMENT Left    "fixed ligaments and chipped patella"  . right transmetatarsal amputation       There were no vitals filed for this visit.  Subjective Assessment - 02/20/20 1019    Subjective  Having issues with increased sweating. No falls.    Pertinent History  Rt Transmetatarsal Amputation, LTTA, OA, COPD, asthma, uncontrolled DM2, depression, Neuropathy, Bipolar, carpal tunnel,  morbid obesity, syncope    Limitations  Lifting;Standing;Walking;House hold activities    Patient Stated Goals  To walk in house & community    Currently in Pain?  Yes    Pain Score  8     Pain Location  Leg   bil legs   Pain Orientation  Right;Left    Pain Descriptors / Indicators  Aching;Sore;Tightness    Pain Onset  More than a month ago    Pain Frequency  Constant    Aggravating Factors   neuropathy, immobility    Pain Relieving Factors  medications, massage           [facility] Adult PT Treatment/Exercise - 02/20/20 1031      Transfers   Transfers  Sit to Stand;Stand to Sit    Sit to Stand  5: Supervision;With upper extremity assist;From chair/3-in-1    Stand to Sit  5: Supervision;With upper extremity assist;To chair/3-in-1      Ambulation/Gait   Ambulation/Gait  Yes    Ambulation/Gait Assistance  4: Min guard    Ambulation/Gait Assistance Details  cues for posture, increased bil step length and weight shifting. continued with use of right anterior Spry GRAFO brace.     Ambulation Distance (Feet)  37 Feet   x1, 45 x1   Assistive device  Rolling walker;Prosthesis;Other (Comment)    Gait Pattern  Step-to pattern;Decreased stride length;Decreased dorsiflexion - right;Decreased weight shift to left;Right foot flat;Antalgic;Lateral hip instability;Trunk flexed;Decreased step length - right;Decreased stance time - left;Right flexed knee in stance;Left flexed knee in stance    Ambulation Surface  Level;Indoor      Therapeutic Activites    Therapeutic Activities  Other Therapeutic Activities    Other Therapeutic Activities  with prosthesis/Rt. GRAFO/RW: pt able to stand with UE support for 3 minutes with supervision. HR 103, SaO2 98% after; with left UE on RW pt withing 2-3 inches of reaching floor to pick up light [name].       Prosthetics   Prosthetic Care Comments  Pt reports being out of bed every day for at least 4-5, some days all day. Wearing the prosthesis during these  hours as well. Pt reports having issue with increased sweating, liner/prosthesis sliding off at times. She is not drying liner/limb during the day. Discussed she should be doing this with prolonged wear to remove any sweat/water inside the liner. Discussed to especially do this prior to leaving the house to go somewhere as this is were she has had the most issues with liner sliding off. She is to start doing this.  She is using antiperspirant, states it's not working. She is only using it at night. Discussed appling prior to donning liner as well.  Pt is to try this. Also discussed use of  sweat block once a week. Pt proviced with information on ordering wipes.    Current prosthetic wear tolerance (days/week)   daily    Residual limb condition   intact with no issues               PT Short Term Goals - 02/20/20 1030      PT SHORT TERM GOAL #1   Title  Patient verbalizes consistant wear of prosthesis for 6 hours/daily. (All updated STGs target date 02/13/2020)    Baseline  02/20/20: pt has increased to daily wear, just not the goal hours    Time  --    Period  --    Status  Partially Met    Target Date  --      PT SHORT TERM GOAL #2   Title  Patient tolerates 3 minutes of standing activities with UE support with vital signs WNL.    Baseline  02/20/20: pt able to stand at [facility] for 3 minutes with stable VS, dyspnea 2/4.    Time  --    Period  --    Status  Achieved    Target Date  --      PT SHORT TERM GOAL #3   Title  Patient able to reach to floor with RW support with supervision.    Baseline  02/20/20: pt able to reach withing 2-3 inches of small object on floor with single UE support with supervision. improved from last assesment, just not to goal.    Time  --    Period  --    Status  Partially Met    Target Date  --      PT SHORT TERM GOAL #4   Title  Patient ambulates 51' with RW & prosthesis and personal GrAFO with supervision.    Baseline  02/20/20:max distance of 45 feet with  RW/GRAFO on right LE/RW with min guard assist, improved from last assessment, just not to goal.    Time  --    Period  --    Status  On-going    Target Date  --        PT Long Term Goals - 01/16/20 1510      PT LONG TERM GOAL #1   Title  Patient verbalizes & demonstrates proper orthotic care and maintains current mod-I prosthetic care ability to enable safe use of orthosis & prosthesis. (All LTGs Target Date 04/11/2020)    Baseline  12/05/2019  Patient is scheduled to receive her first AFO on 12/11/2018 and is dependent in care / use.    Time  12    Period  Weeks    Status  On-going    Target Date  04/11/20      PT LONG TERM GOAL #2   Title  Patient tolerates prosthetic liner wear for >90% and prosthetic wear >50% of awake hours without skin integrity issues for improved mobility.    Baseline  12/05/19: wears liner most wake hours 8-10, wears prosthesis 4-6 hours daily. She is scheduled to receive AFO on 12/11/2018 so no wear yet.    Time  12    Period  Weeks    Status  On-going    Target Date  04/11/20      PT LONG TERM GOAL #3   Title  Patient reports pain increases </= 3 increments with standing & gait increased activities noted in LTGs with RW, prosthesis, and orthosis.    Baseline  12/05/19: Patient reports bilateral  knee pain up to 8/10 with prosthesis wear, standing & gait with baseline at lowest 5/10    Time  13    Period  Weeks    Status  On-going    Target Date  04/11/20      PT LONG TERM GOAL #4   Title  Standing balance with RW support reaching 10" anteriorly, picking up objects from floor & scanning environment safely modified independent.    Baseline  12/05/19: Standing balance with RW support with moderate assist reaching 10" anteriorly, pick up object on floor 7.5", able to look over bilateral shoulder with wt shift with supervision-minA    Time  13    Period  Weeks    Status  On-going    Target Date  04/11/20      PT LONG TERM GOAL #5   Title  Patient ambulates  64' with RW & prosthesis & orthosis modified independent to enable basic community entry.    Baseline  12/05/19: Patient ambulates 27' with RW & prosthesis & clinic's GrAFO with minA    Time  13    Period  Weeks    Status  On-going    Target Date  04/11/20      PT LONG TERM GOAL #6   Title  Patient negotiates ramp & curb with RW & prosthesis & orthosis minA from family to enable community access.    Baseline  12/05/19: Patient is non-ambulatory on stairs, ramps & curbs limiting community mobility.    Time  13    Period  Weeks    Status  On-going    Target Date  04/11/20            Plan - 02/20/20 1030    Clinical Impression Statement  Today's skilled session focused on progress toward STGs with goals partially met to fully met. Also continued to address limb/prothesis management with education on sweat management. The pt has not heard from [facility] on her brace, will follow up between now and her next appointment. The pt in making slow, steady progress and should benefit from continued PT to progress toward unmet goals.    Personal Factors and Comorbidities  Comorbidity 3+;Fitness;Past/Current Experience;Finances;Time since onset of injury/illness/exacerbation    Comorbidities  left Transtibial amputation, right Transmetatarsal amputation, OA, asthma, DM, depression, neuropathy, Bipolar    Examination-Activity Limitations  Bed Mobility;Caring for Others;Carry;Lift;Locomotion Level;Stairs;Stand;Transfers    Examination-Participation Restrictions  Community Activity;Driving;Meal Prep;Other   parenting 42yo dtr   Stability/Clinical Decision Making  Evolving/Moderate complexity    Rehab Potential  Good    PT Frequency  1x / week   1x/wk for 3 weeks, then 2x/wk for 6 weeks   PT Duration  12 weeks    PT Treatment/Interventions  ADLs/Self Care Home Management;DME Instruction;Gait training;Stair training;Functional mobility training;Therapeutic activities;Therapeutic exercise;Balance  training;Neuromuscular re-education;Patient/family education;Prosthetic Training;Orthotic Fit/Training;Vestibular    PT Next Visit Plan  primary PT to set updated STGs- work toward thoses goals    Consulted and Agree with Plan of Care  Patient       Patient will benefit from skilled therapeutic intervention in order to improve the following deficits and impairments:  Abnormal gait, Decreased activity tolerance, Decreased balance, Decreased endurance, Decreased knowledge of use of DME, Decreased mobility, Decreased range of motion, Decreased strength, Dizziness, Impaired flexibility, Postural dysfunction, Prosthetic Dependency, Obesity, Pain  Visit Diagnosis: Unsteadiness on feet  Other abnormalities of gait and mobility  Abnormal posture  Muscle weakness  Pain in right leg  Foot drop,  right  Stiffness of right hip, not elsewhere classified  Stiffness of left hip, not elsewhere classified  Stiffness of right knee, not elsewhere classified  Pain in left leg     Problem List Patient Active Problem List   Diagnosis Date Noted  . Status post transmetatarsal amputation of right foot ([place]) 02/13/2020  . UTI (urinary tract infection) 06/28/2019  . Right hand pain 06/23/2019  . Amputation above knee ([place]) 06/23/2019  . Left shoulder pain 06/23/2019  . Dysuria 06/14/2019  . Abdominal pain   . Abscess of skin of abdomen   . Abscess of buttock, right 05/17/2019  . Severe protein-calorie malnutrition ([place])   . 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[place]) 04/11/2019  . Type 2 diabetes mellitus with diabetic polyneuropathy, with long-term current use of insulin ([name]) 04/11/2019  . Type 2 diabetes mellitus with hyperglycemia, with long-term current use of insulin ([place]) 04/11/2019  . Cellulitis of great toe of right foot 10/29/2018  . Uncontrolled type 2 diabetes mellitus with insulin therapy ([place]) 04/16/2018  . Allergic rhinitis 03/06/2018  . Unilateral complete BKA, left, sequela ([place])   .  Class 3 severe obesity due to excess calories with serious comorbidity and body mass index (BMI) of 50.0 to 59.9 in adult [facility])   . AKI (acute kidney injury) ([place])   . Decreased pedal pulses 06/10/2017  . Unilateral primary osteoarthritis, right knee 10/22/2016  . Primary osteoarthritis of first carpometacarpal joint of left hand 07/30/2016  . Diabetic neuropathy ([place]) 07/14/2016  . Nausea 03/17/2016  . De Quervain's tenosynovitis, bilateral 11/01/2015  . Vitamin D deficiency 09/05/2015  . Recurrent candidiasis of vagina 09/05/2015  . Varicose veins of leg with complications 24/82/5003  . Restless leg syndrome 10/17/2014  . Chronic sinusitis 07/18/2014  . Encounter for chronic pain management 06/30/2013  . HLD (hyperlipidemia) 11/19/2012  . Chest pain 06/27/2012  . Right carpal tunnel syndrome 09/01/2011  . Bilateral knee pain 09/01/2011  . DM (diabetes mellitus) type II uncontrolled, periph vascular disorder ([place]) 05/22/2008  . Morbid obesity ([place]) 05/22/2008  . 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 Depression 05/22/2008  . Obstructive sleep apnea 05/22/2008  . Hypertension 05/22/2008  . Asthma 05/22/2008  . GERD 05/22/2008    Willow Ora, PTA, [facility] [address], [place], [place] [address] [phone] 02/20/20, 2:45 PM   Name: [name] MRN: [id number] Date of Birth: 1976-07-30    PT Short Term Goals - 02/22/20 1012      PT SHORT TERM GOAL #1   Title  Patient verbalizes consistant daily wear of prosthesis for 6 hours total / day. (All updated STGs target date 02/17/2020)    Time  4    Period  Weeks    Status  On-going    Target Date  03/19/20      PT SHORT TERM GOAL #2   Title  Patient tolerates 5 minutes of standing activities with UE support with vital signs WNL.    Time  4    Period  Weeks    Status  Revised    Target Date  03/19/20      PT SHORT TERM GOAL #3   Title  Patient able to reach to floor with RW  support with supervision.    Time  4    Period  Weeks    Status  On-going    Target Date  03/19/20      PT SHORT TERM GOAL #4   Title  Patient ambulates  64' with RW & prosthesis and GrAFO with supervision.    Time  4    Period  Weeks    Status  Revised    Target Date  03/19/20      PT SHORT TERM GOAL #5   Title  Patient negotiates ramp & curb with RW, prosthesis & GrAFO with modA.    Time  4    Period  Weeks    Status  New    Target Date  03/19/20      Jamey Reas, PT, DPT PT Specializing in [place] 02/22/20 10:16 AM Phone:  [phone]  Fax:  [phone] [place] [facility] [address] [place], [place] [address]

## 2020-02-21 ENCOUNTER — Telehealth: Payer: Self-pay | Admitting: Pharmacist

## 2020-02-21 ENCOUNTER — Encounter: Payer: Medicaid Other | Admitting: Physical Therapy

## 2020-02-21 DIAGNOSIS — E1140 Type 2 diabetes mellitus with diabetic neuropathy, unspecified: Secondary | ICD-10-CM | POA: Diagnosis not present

## 2020-02-21 NOTE — Telephone Encounter (Signed)
Called patient on 02/21/2020 at 11:21 AM   Patient unable to talk about DM management at the moment. She would prefer to schedule time to discuss DM management after 4PM today (02/21/2020). Will follow up with patient at that time.  Thank you for involving pharmacy to assist in providing this patient's care.   [name] Iha, PharmD PGY2 Ambulatory Care Pharmacy Resident

## 2020-02-21 NOTE — Telephone Encounter (Signed)
Called patient on 02/21/2020 at 4:05PM and 4:56 PM and left HIPAA-compliant VM with instructions to call [facility] clinic back   Unsuccessful attempt #2  Thank you for involving pharmacy to assist in providing this patient's care.   [name] Iha, PharmD PGY2 Ambulatory Care Pharmacy Resident

## 2020-02-22 ENCOUNTER — Encounter: Payer: Medicaid Other | Admitting: Physical Therapy

## 2020-02-22 DIAGNOSIS — E1140 Type 2 diabetes mellitus with diabetic neuropathy, unspecified: Secondary | ICD-10-CM | POA: Diagnosis not present

## 2020-02-23 DIAGNOSIS — E1140 Type 2 diabetes mellitus with diabetic neuropathy, unspecified: Secondary | ICD-10-CM | POA: Diagnosis not present

## 2020-02-24 DIAGNOSIS — E1140 Type 2 diabetes mellitus with diabetic neuropathy, unspecified: Secondary | ICD-10-CM | POA: Diagnosis not present

## 2020-02-25 DIAGNOSIS — E1140 Type 2 diabetes mellitus with diabetic neuropathy, unspecified: Secondary | ICD-10-CM | POA: Diagnosis not present

## 2020-02-26 DIAGNOSIS — E1140 Type 2 diabetes mellitus with diabetic neuropathy, unspecified: Secondary | ICD-10-CM | POA: Diagnosis not present

## 2020-02-27 ENCOUNTER — Ambulatory Visit: Payer: Medicaid Other | Admitting: Physical Therapy

## 2020-02-27 DIAGNOSIS — E1140 Type 2 diabetes mellitus with diabetic neuropathy, unspecified: Secondary | ICD-10-CM | POA: Diagnosis not present

## 2020-02-28 DIAGNOSIS — E1140 Type 2 diabetes mellitus with diabetic neuropathy, unspecified: Secondary | ICD-10-CM | POA: Diagnosis not present

## 2020-02-29 DIAGNOSIS — E1140 Type 2 diabetes mellitus with diabetic neuropathy, unspecified: Secondary | ICD-10-CM | POA: Diagnosis not present

## 2020-03-01 DIAGNOSIS — E1140 Type 2 diabetes mellitus with diabetic neuropathy, unspecified: Secondary | ICD-10-CM | POA: Diagnosis not present

## 2020-03-02 DIAGNOSIS — E1140 Type 2 diabetes mellitus with diabetic neuropathy, unspecified: Secondary | ICD-10-CM | POA: Diagnosis not present

## 2020-03-03 DIAGNOSIS — E1140 Type 2 diabetes mellitus with diabetic neuropathy, unspecified: Secondary | ICD-10-CM | POA: Diagnosis not present

## 2020-03-04 DIAGNOSIS — E1140 Type 2 diabetes mellitus with diabetic neuropathy, unspecified: Secondary | ICD-10-CM | POA: Diagnosis not present

## 2020-03-05 ENCOUNTER — Telehealth: Payer: Self-pay | Admitting: Pharmacist

## 2020-03-05 ENCOUNTER — Ambulatory Visit: Payer: Medicaid Other | Admitting: Physical Therapy

## 2020-03-05 DIAGNOSIS — E1140 Type 2 diabetes mellitus with diabetic neuropathy, unspecified: Secondary | ICD-10-CM | POA: Diagnosis not present

## 2020-03-05 NOTE — Telephone Encounter (Signed)
Called patient on 03/05/2020 at 1:01 PM and earlier at 11:30 AM Unable to leave HIPAA-compliant VM with instructions to call [facility] clinic back considering phone line was busy.  Unsuccessful attempt #4  Will re-attempt to contact patient tomorrow   Thank you for involving pharmacy to assist in providing this patient's care.   [name] Iha, PharmD PGY2 Ambulatory Care Pharmacy Resident

## 2020-03-05 NOTE — Telephone Encounter (Signed)
Called patient on 03/05/2020 at 12:05 PM. Unable to leave HIPAA-compliant VM with instructions to call [facility] clinic back considering phone line was busy  Unsuccessful attempt #3  Will re-attempt to contact patient later today  Thank you for involving pharmacy to assist in providing this patient's care.   [name] Iha, PharmD PGY2 Ambulatory Care Pharmacy Resident

## 2020-03-06 ENCOUNTER — Telehealth: Payer: Self-pay | Admitting: Pharmacist

## 2020-03-06 DIAGNOSIS — E1140 Type 2 diabetes mellitus with diabetic neuropathy, unspecified: Secondary | ICD-10-CM | POA: Diagnosis not present

## 2020-03-06 NOTE — Telephone Encounter (Signed)
Called patient on 03/06/2020 at 4:07 PM. Unable to leave HIPAA-compliant VM with instructions to call [facility] clinic back considering phone line is busy or disconnected.  Unsuccessful attempt #5  Will await for patient to contact pharmacy team at this time.  Thank you for involving pharmacy to assist in providing this patient's care.   [name] Iha, PharmD PGY2 Ambulatory Care Pharmacy Resident

## 2020-03-07 DIAGNOSIS — E1140 Type 2 diabetes mellitus with diabetic neuropathy, unspecified: Secondary | ICD-10-CM | POA: Diagnosis not present

## 2020-03-11 DIAGNOSIS — E1140 Type 2 diabetes mellitus with diabetic neuropathy, unspecified: Secondary | ICD-10-CM | POA: Diagnosis not present

## 2020-03-12 ENCOUNTER — Other Ambulatory Visit: Payer: Self-pay

## 2020-03-12 ENCOUNTER — Encounter: Payer: Self-pay | Admitting: Physical Therapy

## 2020-03-12 ENCOUNTER — Ambulatory Visit: Payer: Medicaid Other | Attending: Family Medicine | Admitting: Physical Therapy

## 2020-03-12 DIAGNOSIS — R2681 Unsteadiness on feet: Secondary | ICD-10-CM | POA: Insufficient documentation

## 2020-03-12 DIAGNOSIS — M25651 Stiffness of right hip, not elsewhere classified: Secondary | ICD-10-CM | POA: Insufficient documentation

## 2020-03-12 DIAGNOSIS — M21371 Foot drop, right foot: Secondary | ICD-10-CM

## 2020-03-12 DIAGNOSIS — M25652 Stiffness of left hip, not elsewhere classified: Secondary | ICD-10-CM | POA: Insufficient documentation

## 2020-03-12 DIAGNOSIS — M25661 Stiffness of right knee, not elsewhere classified: Secondary | ICD-10-CM | POA: Diagnosis not present

## 2020-03-12 DIAGNOSIS — R293 Abnormal posture: Secondary | ICD-10-CM | POA: Diagnosis not present

## 2020-03-12 DIAGNOSIS — E1140 Type 2 diabetes mellitus with diabetic neuropathy, unspecified: Secondary | ICD-10-CM | POA: Diagnosis not present

## 2020-03-12 DIAGNOSIS — R2689 Other abnormalities of gait and mobility: Secondary | ICD-10-CM

## 2020-03-12 DIAGNOSIS — M79604 Pain in right leg: Secondary | ICD-10-CM

## 2020-03-12 DIAGNOSIS — R531 Weakness: Secondary | ICD-10-CM | POA: Diagnosis not present

## 2020-03-12 NOTE — Therapy (Signed)
[facility] [address] [place], [place], [address] Phone: [phone]   Fax:  [phone]  Physical Therapy Treatment  Patient Details  Name: [name] MRN: [id number] Date of Birth: 07/15/1976 Referring Provider (PT): [name], MD   Encounter Date: 03/12/2020  PT End of Session - 03/12/20 1143    Visit Number  10    Number of Visits  21    Authorization Type  Medicaid    Authorization Time Period  12 visits 01/23/2020 - 04/15/2020    Authorization - Visit Number  3    Authorization - Number of Visits  12    PT Start Time  1100    PT Stop Time  1144    PT Time Calculation (min)  44 min    Equipment Utilized During Treatment  Gait belt    Activity Tolerance  Patient tolerated treatment well;Patient limited by fatigue    Behavior During Therapy  WFL for tasks assessed/performed       Past Medical History:  Diagnosis Date  . Acute osteomyelitis of ankle or foot, left ([place]) 01/31/2018  . Alveolar hypoventilation   . Anemia    not on iron pill  . Asthma   . Bipolar 2 disorder ([place])   . Carpal tunnel syndrome on right    recurrent  . Cellulitis 08/2010-08/2011  . Chronic pain   . COPD (chronic obstructive pulmonary disease) ([name])    Symbicort daily and Proventil as needed  . Costochondritis   . Depression   . Diabetes mellitus type II, uncontrolled ([place]) 2000   Type 2, Uncontrolled.Takes Lantus daily.Fasting blood sugar runs 150  . Dizziness    occasionally  . Drug-seeking behavior   . GERD (gastroesophageal reflux disease)    takes Pantoprazole and Zantac daily  . Headache    migraine-last one about a yr ago.Topamax daily  . HLD (hyperlipidemia)    takes Atorvastatin daily  . Hypertension    takes Lisinopril and Coreg daily  . Morbid obesity ([place])   . Muscle spasm    takes Flexeril as needed  . Nocturia   . OSA on CPAP   . Peripheral neuropathy    takes Gabapentin daily  . Pneumonia    "walking" several yrs ago and as a baby (12/05/2018)  . Rectal fissure   . Restless leg   . Syncope 02/25/2016  . Urinary frequency   . Varicose veins    Right medial thigh and Left leg     Past Surgical History:  Procedure Laterality Date  . AMPUTATION Left 02/01/2018   Procedure: LEFT FOURTH AND 5TH TOE RAY AMPUTATION;  Surgeon: [name], MD;  Location: [facility];  Service: Orthopedics;  Laterality: Left;  . AMPUTATION Left 03/03/2018   Procedure: LEFT BELOW KNEE AMPUTATION;  Surgeon: [name], MD;  Location: [facility];  Service: Orthopedics;  Laterality: Left;  . CARPAL TUNNEL RELEASE Bilateral   . CESAREAN SECTION  2007  . INCISION AND DRAINAGE PERIRECTAL ABSCESS Left 05/18/2019   Procedure: IRRIGATION AND DEBRIDEMENT OF PANNIS ABSCESS, POSSIBLE DEBRIDEMENT OF BUTTOCK WOUND;  Surgeon: Donnie Mesa, MD;  Location: [facility];  Service: General;  Laterality: Left;  . IRRIGATION AND DEBRIDEMENT BUTTOCKS Left 05/17/2019   Procedure: IRRIGATION AND DEBRIDEMENT BUTTOCKS;  Surgeon: Donnie Mesa, MD;  Location: [facility];  Service: General;  Laterality: Left;  . KNEE ARTHROSCOPY Right 07/17/2010  . LEFT HEART CATHETERIZATION WITH CORONARY ANGIOGRAM N/A 07/27/2012   Procedure: LEFT HEART  CATHETERIZATION WITH CORONARY ANGIOGRAM;  Surgeon: Sherren Mocha, MD;  Location: [facility] CATH LAB;  Service: Cardiovascular;  Laterality: N/A;  . MASS EXCISION N/A 06/29/2013   Procedure:  WIDE LOCAL EXCISION OF POSTERIOR NECK ABSCESS;  Surgeon: Ralene Ok, MD;  Location: [facility];  Service: General;  Laterality: N/A;  . REPAIR KNEE LIGAMENT Left    "fixed ligaments and chipped patella"  . right transmetatarsal amputation       There were no vitals filed for this visit.  Subjective Assessment - 03/12/20 1100    Subjective  She is getting AFO when can schedule appt at [facility].  She has been feeling "blah"  The bone on left leg is hurting. She is wearing prosthesis daily. She's donnes ~1hr after waking & wears until  ready for bed ~10-12 hours later.    Pertinent History  Rt Transmetatarsal Amputation, LTTA, OA, COPD, asthma, uncontrolled DM2, depression, Neuropathy, Bipolar, carpal tunnel, morbid obesity, syncope    Limitations  Lifting;Standing;Walking;House hold activities    Patient Stated Goals  To walk in house & community    Currently in Pain?  Yes    Pain Score  7     Pain Location  Leg   residual limb on tibial crest   Pain Orientation  Left    Pain Descriptors / Indicators  Sore    Pain Type  Other (Comment)   prosthetic pain   Pain Onset  More than a month ago    Pain Frequency  Intermittent    Aggravating Factors   when wearing prosthesis    Pain Relieving Factors  taking prosthesis off                       [facility] Adult PT Treatment/Exercise - 03/12/20 1100      Transfers   Transfers  Sit to Stand;Stand to Sit    Sit to Stand  5: Supervision;With upper extremity assist;From chair/3-in-1    Stand to Sit  5: Supervision;With upper extremity assist;To chair/3-in-1      Ambulation/Gait   Ambulation/Gait  Yes    Ambulation/Gait Assistance  4: Min guard    Ambulation/Gait Assistance Details  verbal cues on RW position, upright posture & wt shift    Ambulation Distance (Feet)  10 Feet    Assistive device  Rolling walker;Prosthesis;Other (Comment)    Gait Pattern  Step-to pattern;Decreased stride length;Decreased dorsiflexion - right;Decreased weight shift to left;Right foot flat;Antalgic;Lateral hip instability;Trunk flexed;Decreased step length - right;Decreased stance time - left;Right flexed knee in stance;Left flexed knee in stance    Ambulation Surface  Level;Indoor      Self-Care   ADL's  Patient able to manage elastic waist band pants on left hip with RUE support on RW but unable to pull up right side of pants with LUE support.       Therapeutic Activites    Therapeutic Activities  --    Other Therapeutic Activities  Pt reaches forward with LUE 2" with RUE  support on RW.  Pt tolerated standing for 2 minutes.       Prosthetics   Prosthetic Care Comments   PT reviewed again that sitting without prosthesis supported puts tension on distal tibia (high probability of issues with her limb pain.)  PT demo supporting on Yoga block (home can be stuffed shoe box) PT demo, instructed that foot may make contact when she leans forward to look so her daughter should check when sitting back against back of  couch or chair. Dtr & pt verbalized understanding.  PT also recommended using left foot rest (not elevating leg rest) when leaving house. If going longer distances use both foot rest.     Current prosthetic wear tolerance (days/week)   daily    Current prosthetic wear tolerance (#hours/day)   She reports prosthesis wear most of time out of bed during day.      Residual limb condition   intact with no issues    Education Provided  Proper wear schedule/adjustment;Other (comment)   see prosthetic care comments   Person(s) Educated  Patient    Education Method  Explanation;Verbal cues    Education Method  Verbalized understanding;Verbal cues required;Needs further instruction               PT Short Term Goals - 02/22/20 1012      PT SHORT TERM GOAL #1   Title  Patient verbalizes consistant daily wear of prosthesis for 6 hours total / day. (All updated STGs target date 02/17/2020)    Time  4    Period  Weeks    Status  On-going    Target Date  03/19/20      PT SHORT TERM GOAL #2   Title  Patient tolerates 5 minutes of standing activities with UE support with vital signs WNL.    Time  4    Period  Weeks    Status  Revised    Target Date  03/19/20      PT SHORT TERM GOAL #3   Title  Patient able to reach to floor with RW support with supervision.    Time  4    Period  Weeks    Status  On-going    Target Date  03/19/20      PT SHORT TERM GOAL #4   Title  Patient ambulates 57' with RW & prosthesis and GrAFO with supervision.    Time  4     Period  Weeks    Status  Revised    Target Date  03/19/20      PT SHORT TERM GOAL #5   Title  Patient negotiates ramp & curb with RW, prosthesis & GrAFO with modA.    Time  4    Period  Weeks    Status  New    Target Date  03/19/20        PT Long Term Goals - 01/16/20 1510      PT LONG TERM GOAL #1   Title  Patient verbalizes & demonstrates proper orthotic care and maintains current mod-I prosthetic care ability to enable safe use of orthosis & prosthesis. (All LTGs Target Date 04/11/2020)    Baseline  12/05/2019  Patient is scheduled to receive her first AFO on 12/11/2018 and is dependent in care / use.    Time  12    Period  Weeks    Status  On-going    Target Date  04/11/20      PT LONG TERM GOAL #2   Title  Patient tolerates prosthetic liner wear for >90% and prosthetic wear >50% of awake hours without skin integrity issues for improved mobility.    Baseline  12/05/19: wears liner most wake hours 8-10, wears prosthesis 4-6 hours daily. She is scheduled to receive AFO on 12/11/2018 so no wear yet.    Time  12    Period  Weeks    Status  On-going    Target Date  04/11/20  PT LONG TERM GOAL #3   Title  Patient reports pain increases </= 3 increments with standing & gait increased activities noted in LTGs with RW, prosthesis, and orthosis.    Baseline  12/05/19: Patient reports bilateral knee pain up to 8/10 with prosthesis wear, standing & gait with baseline at lowest 5/10    Time  13    Period  Weeks    Status  On-going    Target Date  04/11/20      PT LONG TERM GOAL #4   Title  Standing balance with RW support reaching 10" anteriorly, picking up objects from floor & scanning environment safely modified independent.    Baseline  12/05/19: Standing balance with RW support with moderate assist reaching 10" anteriorly, pick up object on floor 7.5", able to look over bilateral shoulder with wt shift with supervision-minA    Time  13    Period  Weeks    Status  On-going     Target Date  04/11/20      PT LONG TERM GOAL #5   Title  Patient ambulates 63' with RW & prosthesis & orthosis modified independent to enable basic community entry.    Baseline  12/05/19: Patient ambulates 44' with RW & prosthesis & clinic's GrAFO with minA    Time  13    Period  Weeks    Status  On-going    Target Date  04/11/20      PT LONG TERM GOAL #6   Title  Patient negotiates ramp & curb with RW & prosthesis & orthosis minA from family to enable community access.    Baseline  12/05/19: Patient is non-ambulatory on stairs, ramps & curbs limiting community mobility.    Time  13    Period  Weeks    Status  On-going    Target Date  04/11/20            Plan - 03/12/20 2157    Clinical Impression Statement  Patient's residual limb pain seems related to sitting without supporting prosthesis.  She should use foot rest on w/c when leaving the house instead of holding her prosthesis up.  Pt has appt next week to receive her AFO.    Personal Factors and Comorbidities  Comorbidity 3+;Fitness;Past/Current Experience;Finances;Time since onset of injury/illness/exacerbation    Comorbidities  left Transtibial amputation, right Transmetatarsal amputation, OA, asthma, DM, depression, neuropathy, Bipolar    Examination-Activity Limitations  Bed Mobility;Caring for Others;Carry;Lift;Locomotion Level;Stairs;Stand;Transfers    Examination-Participation Restrictions  Community Activity;Driving;Meal Prep;Other   parenting 42yo dtr   Stability/Clinical Decision Making  Evolving/Moderate complexity    Rehab Potential  Good    PT Frequency  1x / week   1x/wk for 3 weeks, then 2x/wk for 6 weeks   PT Duration  12 weeks    PT Treatment/Interventions  ADLs/Self Care Home Management;DME Instruction;Gait training;Stair training;Functional mobility training;Therapeutic activities;Therapeutic exercise;Balance training;Neuromuscular re-education;Patient/family education;Prosthetic Training;Orthotic  Fit/Training;Vestibular    PT Next Visit Plan  check updated STGs, check if she has been supporting prosthesis in sitting.    Consulted and Agree with Plan of Care  Patient       Patient will benefit from skilled therapeutic intervention in order to improve the following deficits and impairments:  Abnormal gait, Decreased activity tolerance, Decreased balance, Decreased endurance, Decreased knowledge of use of DME, Decreased mobility, Decreased range of motion, Decreased strength, Dizziness, Impaired flexibility, Postural dysfunction, Prosthetic Dependency, Obesity, Pain  Visit Diagnosis: Unsteadiness on feet  Other abnormalities of  gait and mobility  Abnormal posture  Weakness generalized  Pain in right leg  Foot drop, right  Stiffness of right hip, not elsewhere classified  Stiffness of left hip, not elsewhere classified  Stiffness of right knee, not elsewhere classified     Problem List Patient Active Problem List   Diagnosis Date Noted  . Status post transmetatarsal amputation of right foot ([place]) 02/13/2020  . UTI (urinary tract infection) 06/28/2019  . Right hand pain 06/23/2019  . Amputation above knee ([place]) 06/23/2019  . Left shoulder pain 06/23/2019  . Dysuria 06/14/2019  . Abdominal pain   . Abscess of skin of abdomen   . Abscess of buttock, right 05/17/2019  . Severe protein-calorie malnutrition ([place])   . 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[place]) 04/11/2019  . Type 2 diabetes mellitus with diabetic polyneuropathy, with long-term current use of insulin ([place]) 04/11/2019  . Type 2 diabetes mellitus with hyperglycemia, with long-term current use of insulin ([place]) 04/11/2019  . Cellulitis of great toe of right foot 10/29/2018  . Uncontrolled type 2 diabetes mellitus with insulin therapy ([place]) 04/16/2018  . Allergic rhinitis 03/06/2018  . Unilateral complete BKA, left, sequela ([place])   . Class 3 severe obesity due to excess calories with serious comorbidity and body mass index  (BMI) of 50.0 to 59.9 in adult [facility])   . AKI (acute kidney injury) ([place])   . Decreased pedal pulses 06/10/2017  . Unilateral primary osteoarthritis, right knee 10/22/2016  . Primary osteoarthritis of first carpometacarpal joint of left hand 07/30/2016  . Diabetic neuropathy ([place]) 07/14/2016  . Nausea 03/17/2016  . De Quervain's tenosynovitis, bilateral 11/01/2015  . Vitamin D deficiency 09/05/2015  . Recurrent candidiasis of vagina 09/05/2015  . Varicose veins of leg with complications [id number]  . Restless leg syndrome 10/17/2014  . Chronic sinusitis 07/18/2014  . Encounter for chronic pain management 06/30/2013  . HLD (hyperlipidemia) 11/19/2012  . Chest pain 06/27/2012  . Right carpal tunnel syndrome 09/01/2011  . Bilateral knee pain 09/01/2011  . DM (diabetes mellitus) type II uncontrolled, periph vascular disorder ([place]) 05/22/2008  . Morbid obesity ([place]) 05/22/2008  . 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 Depression 05/22/2008  . Obstructive sleep apnea 05/22/2008  . Hypertension 05/22/2008  . Asthma 05/22/2008  . GERD 05/22/2008    Jamey Reas PT, DPT 03/12/2020, 10:03 PM  [facility] [address] [place], [place], [address] Phone: [phone]   Fax:  [phone]  Name: [name] MRN: [id number] Date of Birth: 02-16-1976

## 2020-03-13 DIAGNOSIS — E1140 Type 2 diabetes mellitus with diabetic neuropathy, unspecified: Secondary | ICD-10-CM | POA: Diagnosis not present

## 2020-03-14 ENCOUNTER — Other Ambulatory Visit: Payer: Self-pay | Admitting: *Deleted

## 2020-03-14 DIAGNOSIS — E1140 Type 2 diabetes mellitus with diabetic neuropathy, unspecified: Secondary | ICD-10-CM | POA: Diagnosis not present

## 2020-03-14 MED ORDER — ROPINIROLE HCL 0.5 MG PO TABS: 0.5000 mg | ORAL_TABLET | Freq: Every day | ORAL | 1 refills | Status: DC

## 2020-03-16 DIAGNOSIS — E1140 Type 2 diabetes mellitus with diabetic neuropathy, unspecified: Secondary | ICD-10-CM | POA: Diagnosis not present

## 2020-03-17 DIAGNOSIS — E1140 Type 2 diabetes mellitus with diabetic neuropathy, unspecified: Secondary | ICD-10-CM | POA: Diagnosis not present

## 2020-03-18 DIAGNOSIS — M25371 Other instability, right ankle: Secondary | ICD-10-CM | POA: Diagnosis not present

## 2020-03-18 DIAGNOSIS — M21371 Foot drop, right foot: Secondary | ICD-10-CM | POA: Diagnosis not present

## 2020-03-18 DIAGNOSIS — E1140 Type 2 diabetes mellitus with diabetic neuropathy, unspecified: Secondary | ICD-10-CM | POA: Diagnosis not present

## 2020-03-19 ENCOUNTER — Ambulatory Visit: Payer: Medicaid Other | Admitting: Physical Therapy

## 2020-03-19 DIAGNOSIS — E1140 Type 2 diabetes mellitus with diabetic neuropathy, unspecified: Secondary | ICD-10-CM | POA: Diagnosis not present

## 2020-03-20 DIAGNOSIS — E1140 Type 2 diabetes mellitus with diabetic neuropathy, unspecified: Secondary | ICD-10-CM | POA: Diagnosis not present

## 2020-03-21 DIAGNOSIS — E1140 Type 2 diabetes mellitus with diabetic neuropathy, unspecified: Secondary | ICD-10-CM | POA: Diagnosis not present

## 2020-03-22 DIAGNOSIS — E1140 Type 2 diabetes mellitus with diabetic neuropathy, unspecified: Secondary | ICD-10-CM | POA: Diagnosis not present

## 2020-03-23 DIAGNOSIS — E1140 Type 2 diabetes mellitus with diabetic neuropathy, unspecified: Secondary | ICD-10-CM | POA: Diagnosis not present

## 2020-03-24 DIAGNOSIS — E1140 Type 2 diabetes mellitus with diabetic neuropathy, unspecified: Secondary | ICD-10-CM | POA: Diagnosis not present

## 2020-03-25 DIAGNOSIS — E1140 Type 2 diabetes mellitus with diabetic neuropathy, unspecified: Secondary | ICD-10-CM | POA: Diagnosis not present

## 2020-03-26 ENCOUNTER — Encounter (HOSPITAL_COMMUNITY): Payer: Self-pay | Admitting: Emergency Medicine

## 2020-03-26 ENCOUNTER — Other Ambulatory Visit: Payer: Self-pay

## 2020-03-26 ENCOUNTER — Telehealth: Payer: Self-pay | Admitting: Family Medicine

## 2020-03-26 ENCOUNTER — Emergency Department (HOSPITAL_COMMUNITY)
Admission: EM | Admit: 2020-03-26 | Discharge: 2020-03-26 | Disposition: A | Discharge status: Home / Self Care | Payer: Medicaid Other | Attending: Emergency Medicine | Admitting: Emergency Medicine

## 2020-03-26 ENCOUNTER — Ambulatory Visit: Payer: Medicaid Other | Admitting: Physical Therapy

## 2020-03-26 ENCOUNTER — Emergency Department (HOSPITAL_COMMUNITY): Payer: Medicaid Other

## 2020-03-26 DIAGNOSIS — Z87891 Personal history of nicotine dependence: Secondary | ICD-10-CM | POA: Diagnosis not present

## 2020-03-26 DIAGNOSIS — R0602 Shortness of breath: Secondary | ICD-10-CM | POA: Diagnosis not present

## 2020-03-26 DIAGNOSIS — Z794 Long term (current) use of insulin: Secondary | ICD-10-CM | POA: Diagnosis not present

## 2020-03-26 DIAGNOSIS — I10 Essential (primary) hypertension: Secondary | ICD-10-CM | POA: Insufficient documentation

## 2020-03-26 DIAGNOSIS — Z79899 Other long term (current) drug therapy: Secondary | ICD-10-CM | POA: Diagnosis not present

## 2020-03-26 DIAGNOSIS — E1165 Type 2 diabetes mellitus with hyperglycemia: Secondary | ICD-10-CM | POA: Diagnosis not present

## 2020-03-26 DIAGNOSIS — R0789 Other chest pain: Secondary | ICD-10-CM | POA: Insufficient documentation

## 2020-03-26 DIAGNOSIS — J449 Chronic obstructive pulmonary disease, unspecified: Secondary | ICD-10-CM | POA: Diagnosis not present

## 2020-03-26 DIAGNOSIS — E119 Type 2 diabetes mellitus without complications: Secondary | ICD-10-CM | POA: Insufficient documentation

## 2020-03-26 DIAGNOSIS — R079 Chest pain, unspecified: Secondary | ICD-10-CM | POA: Diagnosis not present

## 2020-03-26 DIAGNOSIS — R197 Diarrhea, unspecified: Secondary | ICD-10-CM | POA: Insufficient documentation

## 2020-03-26 DIAGNOSIS — R0902 Hypoxemia: Secondary | ICD-10-CM | POA: Diagnosis not present

## 2020-03-26 LAB — CBG MONITORING, ED
Glucose-Capillary: 596 mg/dL (ref 70–99)
Glucose-Capillary: 530 mg/dL (ref 70–99)
Glucose-Capillary: 449 mg/dL — ABNORMAL HIGH (ref 70–99)

## 2020-03-26 LAB — CBC WITH DIFFERENTIAL/PLATELET
Abs Immature Granulocytes: 0.1 10*3/uL — ABNORMAL HIGH (ref 0.00–0.07)
Basophils Absolute: 0.1 10*3/uL (ref 0.0–0.1)
Basophils Relative: 1 %
Eosinophils Absolute: 0.2 10*3/uL (ref 0.0–0.5)
Eosinophils Relative: 2 %
HCT: 39.3 % (ref 36.0–46.0)
Hemoglobin: 12.6 g/dL (ref 12.0–15.0)
Immature Granulocytes: 1 %
Lymphocytes Relative: 40 %
Lymphs Abs: 4 10*3/uL (ref 0.7–4.0)
MCH: 29.3 pg (ref 26.0–34.0)
MCHC: 32.1 g/dL (ref 30.0–36.0)
MCV: 91.4 fL (ref 80.0–100.0)
Monocytes Absolute: 0.7 10*3/uL (ref 0.1–1.0)
Monocytes Relative: 7 %
Neutro Abs: 5 10*3/uL (ref 1.7–7.7)
Neutrophils Relative %: 49 %
Platelets: 384 10*3/uL (ref 150–400)
RBC: 4.3 MIL/uL (ref 3.87–5.11)
RDW: 12.5 % (ref 11.5–15.5)
WBC: 10.1 10*3/uL (ref 4.0–10.5)
nRBC: 0 % (ref 0.0–0.2)

## 2020-03-26 LAB — COMPREHENSIVE METABOLIC PANEL
ALT: 16 U/L (ref 0–44)
AST: 11 U/L — ABNORMAL LOW (ref 15–41)
Albumin: 2.7 g/dL — ABNORMAL LOW (ref 3.5–5.0)
Alkaline Phosphatase: 172 U/L — ABNORMAL HIGH (ref 38–126)
Anion gap: 13 (ref 5–15)
BUN: 37 mg/dL — ABNORMAL HIGH (ref 6–20)
CO2: 21 mmol/L — ABNORMAL LOW (ref 22–32)
Calcium: 8.6 mg/dL — ABNORMAL LOW (ref 8.9–10.3)
Chloride: 95 mmol/L — ABNORMAL LOW (ref 98–111)
Creatinine, Ser: 1.24 mg/dL — ABNORMAL HIGH (ref 0.44–1.00)
GFR calc Af Amer: >60 mL/min (ref >60)
GFR calc non Af Amer: 54 mL/min — ABNORMAL LOW (ref >60)
Glucose, Bld: 576 mg/dL (ref 70–99)
Potassium: 4.9 mmol/L (ref 3.5–5.1)
Sodium: 129 mmol/L — ABNORMAL LOW (ref 135–145)
Total Bilirubin: 0.1 mg/dL — ABNORMAL LOW (ref 0.3–1.2)
Total Protein: 6.6 g/dL (ref 6.5–8.1)

## 2020-03-26 LAB — URINALYSIS, ROUTINE W REFLEX MICROSCOPIC
Bacteria, UA: NONE SEEN
Bilirubin Urine: NEGATIVE
Glucose, UA: >=500 mg/dL — AB
Ketones, ur: NEGATIVE mg/dL
Leukocytes,Ua: NEGATIVE
Nitrite: NEGATIVE
Protein, ur: NEGATIVE mg/dL
Specific Gravity, Urine: 1.022 (ref 1.005–1.030)
pH: 5 (ref 5.0–8.0)

## 2020-03-26 LAB — I-STAT BETA HCG BLOOD, ED (MC, WL, AP ONLY): I-stat hCG, quantitative: <5 m[IU]/mL (ref <5)

## 2020-03-26 LAB — TROPONIN I (HIGH SENSITIVITY)
Troponin I (High Sensitivity): 15 ng/L (ref <18)
Troponin I (High Sensitivity): 14 ng/L (ref <18)

## 2020-03-26 MED ORDER — INSULIN ASPART 100 UNIT/ML ~~LOC~~ SOLN
15.0000 [IU] | Freq: Once | SUBCUTANEOUS | Status: AC
  Administered 2020-03-26: 14:00:00 15 [IU] via SUBCUTANEOUS

## 2020-03-26 MED ORDER — ONDANSETRON 4 MG PO TBDP: 4.0000 mg | ORAL_TABLET | Freq: Three times a day (TID) | ORAL | 0 refills | Status: DC | PRN

## 2020-03-26 MED ORDER — ONDANSETRON HCL 4 MG/2ML IJ SOLN
4.0000 mg | Freq: Once | INTRAMUSCULAR | Status: AC
  Administered 2020-03-26: 4 mg via INTRAVENOUS
  Filled 2020-03-26: qty 2

## 2020-03-26 MED ORDER — FENTANYL CITRATE (PF) 100 MCG/2ML IJ SOLN
50.0000 ug | Freq: Once | INTRAMUSCULAR | Status: AC
  Administered 2020-03-26: 50 ug via INTRAVENOUS
  Filled 2020-03-26: qty 2

## 2020-03-26 MED ORDER — SODIUM CHLORIDE 0.9 % IV BOLUS
1000.0000 mL | Freq: Once | INTRAVENOUS | Status: AC
  Administered 2020-03-26: 1000 mL via INTRAVENOUS

## 2020-03-26 MED ORDER — ACETAMINOPHEN 325 MG PO TABS
650.0000 mg | ORAL_TABLET | Freq: Once | ORAL | Status: AC
  Administered 2020-03-26: 10:00:00 650 mg via ORAL
  Filled 2020-03-26: qty 2

## 2020-03-26 NOTE — ED Notes (Signed)
Cancelled PTAR Ivin Booty

## 2020-03-26 NOTE — ED Notes (Signed)
Help place patient on the bedpan patient is now off the bedpan resting with call bell in reach

## 2020-03-26 NOTE — ED Provider Notes (Signed)
[facility] EMERGENCY DEPARTMENT Provider Note   CSN: [id number] Arrival date & time: 03/26/20  8115     History Chief Complaint  Patient presents with  . Chest Pain  . Diarrhea    [name] is a 44 y.o. female with past medical history significant for bipolar 2 disorder, chronic pain, type 2 diabetes on insulin, COPD, hypertension, hyperlipidemia presents to emergency department today with chief complaint of intermittent chest pain x3 days.  She describes the pain as feeling like a pressure in the middle of her chest.  Pain is worse when she changes positions.  Pain is not worse with exertion.  She rates the pain 8 of 10 in severity.  She denies history of similar pain.  Patient received aspirin and nitroglycerin from EMS.  She states that helped her pain however she now has a mild headache. She is also endorsing 3 episodes of nonbloody diarrhea over the last 48 hours.  She denies any associated abdominal pain, nausea, emesis, recent antibiotic use or travel.  She also denies fever, diaphoresis, chills, shortness of breath, cough, palpitations, syncope, back pain, urinary symptoms.  Family history of heart disease includes her dad having a CABG, no 1 with MI at a young age.  Patient does not smoke. History provided by patient with additional history obtained from chart review.     Past Medical History:  Diagnosis Date  . Acute osteomyelitis of ankle or foot, left ([place]) 01/31/2018  . Alveolar hypoventilation   . Anemia    not on iron pill  . Asthma   . Bipolar 2 disorder ([place])   . Carpal tunnel syndrome on right    recurrent  . Cellulitis 08/2010-08/2011  . Chronic pain   . COPD (chronic obstructive pulmonary disease) ([name])    Symbicort daily and Proventil as needed  . Costochondritis   . Depression   . Diabetes mellitus type II, uncontrolled ([place]) 2000   Type 2, Uncontrolled.Takes Lantus daily.Fasting blood sugar runs 150  . Dizziness    occasionally  .  Drug-seeking behavior   . GERD (gastroesophageal reflux disease)    takes Pantoprazole and Zantac daily  . Headache    migraine-last one about a yr ago.Topamax daily  . HLD (hyperlipidemia)    takes Atorvastatin daily  . Hypertension    takes Lisinopril and Coreg daily  . Morbid obesity ([place])   . Muscle spasm    takes Flexeril as needed  . Nocturia   . OSA on CPAP   . Peripheral neuropathy    takes Gabapentin daily  . Pneumonia    "walking" several yrs ago and as a baby (12/05/2018)  . Rectal fissure   . Restless leg   . Syncope 02/25/2016  . Urinary frequency   . Varicose veins    Right medial thigh and Left leg     Patient Active Problem List   Diagnosis Date Noted  . Status post transmetatarsal amputation of right foot ([place]) 02/13/2020  . UTI (urinary tract infection) 06/28/2019  . Right hand pain 06/23/2019  . Amputation above knee ([place]) 06/23/2019  . Left shoulder pain 06/23/2019  . Dysuria 06/14/2019  . Abdominal pain   . Abscess of skin of abdomen   . Abscess of buttock, right 05/17/2019  . Severe protein-calorie malnutrition ([place])   . 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[place]) 04/11/2019  . Type 2 diabetes mellitus with diabetic polyneuropathy, with long-term current use of insulin ([place]) 04/11/2019  . Type 2 diabetes mellitus with  hyperglycemia, with long-term current use of insulin ([place]) 04/11/2019  . Cellulitis of great toe of right foot 10/29/2018  . Uncontrolled type 2 diabetes mellitus with insulin therapy ([place]) 04/16/2018  . Allergic rhinitis 03/06/2018  . Unilateral complete BKA, left, sequela ([place])   . Class 3 severe obesity due to excess calories with serious comorbidity and body mass index (BMI) of 50.0 to 59.9 in adult [facility])   . AKI (acute kidney injury) ([place])   . Decreased pedal pulses 06/10/2017  . Unilateral primary osteoarthritis, right knee 10/22/2016  . Primary osteoarthritis of first carpometacarpal joint of left hand 07/30/2016  . Diabetic neuropathy  ([place]) 07/14/2016  . Nausea 03/17/2016  . De Quervain's tenosynovitis, bilateral 11/01/2015  . Vitamin D deficiency 09/05/2015  . Recurrent candidiasis of vagina 09/05/2015  . Varicose veins of leg with complications 79/39/0300  . Restless leg syndrome 10/17/2014  . Chronic sinusitis 07/18/2014  . Encounter for chronic pain management 06/30/2013  . HLD (hyperlipidemia) 11/19/2012  . Chest pain 06/27/2012  . Right carpal tunnel syndrome 09/01/2011  . Bilateral knee pain 09/01/2011  . DM (diabetes mellitus) type II uncontrolled, periph vascular disorder ([place]) 05/22/2008  . Morbid obesity ([place]) 05/22/2008  . 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 Depression 05/22/2008  . Obstructive sleep apnea 05/22/2008  . Hypertension 05/22/2008  . Asthma 05/22/2008  . GERD 05/22/2008    Past Surgical History:  Procedure Laterality Date  . AMPUTATION Left 02/01/2018   Procedure: LEFT FOURTH AND 5TH TOE RAY AMPUTATION;  Surgeon: [name], MD;  Location: [facility];  Service: Orthopedics;  Laterality: Left;  . AMPUTATION Left 03/03/2018   Procedure: LEFT BELOW KNEE AMPUTATION;  Surgeon: [name], MD;  Location: [facility];  Service: Orthopedics;  Laterality: Left;  . CARPAL TUNNEL RELEASE Bilateral   . CESAREAN SECTION  2007  . INCISION AND DRAINAGE PERIRECTAL ABSCESS Left 05/18/2019   Procedure: IRRIGATION AND DEBRIDEMENT OF PANNIS ABSCESS, POSSIBLE DEBRIDEMENT OF BUTTOCK WOUND;  Surgeon: Donnie Mesa, MD;  Location: [facility];  Service: General;  Laterality: Left;  . IRRIGATION AND DEBRIDEMENT BUTTOCKS Left 05/17/2019   Procedure: IRRIGATION AND DEBRIDEMENT BUTTOCKS;  Surgeon: Donnie Mesa, MD;  Location: [facility];  Service: General;  Laterality: Left;  . KNEE ARTHROSCOPY Right 07/17/2010  . LEFT HEART CATHETERIZATION WITH CORONARY ANGIOGRAM N/A 07/27/2012   Procedure: LEFT HEART CATHETERIZATION WITH CORONARY ANGIOGRAM;  Surgeon: Sherren Mocha, MD;  Location[facility];  Service:  Cardiovascular;  Laterality: N/A;  . MASS EXCISION N/A 06/29/2013   Procedure:  WIDE LOCAL EXCISION OF POSTERIOR NECK ABSCESS;  Surgeon: Ralene Ok, MD;  Location: [facility];  Service: General;  Laterality: N/A;  . REPAIR KNEE LIGAMENT Left    "fixed ligaments and chipped patella"  . right transmetatarsal amputation        OB History    Gravida  2   Para  1   Term      Preterm      AB  1   Living  1     SAB  1   TAB      Ectopic      Multiple      Live Births              Family History  Problem Relation Age of Onset  . Diabetes Mother   . Hyperlipidemia Mother   . Depression Mother   . Varicose Veins Mother   . Heart attack Paternal Uncle   . Heart  disease Paternal Grandmother   . Heart attack Paternal Grandmother   . Heart attack Paternal Grandfather   . Heart disease Paternal Grandfather   . Heart attack Father   . Cancer Maternal Grandmother        COLON  . Hypertension Maternal Grandmother   . Hyperlipidemia Maternal Grandmother   . Diabetes Maternal Grandmother   . Other Maternal Grandfather        GUN SHOT    Social History   Tobacco Use  . Smoking status: Former Smoker    Packs/day: 0.30    Years: 0.30    Pack years: 0.09    Types: Cigarettes    Quit date: 12/06/1993    Years since quitting: 26.3  . Smokeless tobacco: Never Used  Substance Use Topics  . Alcohol use: No    Alcohol/week: 0.0 standard drinks  . Drug use: Yes    Comment: OD attempts on home meds      Home Medications Prior to Admission medications   Medication Sig Start Date End Date Taking? Authorizing Provider  Accu-Chek Softclix Lancets lancets Use as instructed to check blood sugar 3 times per day Patient taking differently: 1 each by Other route See admin instructions. Use as instructed to check blood sugar 3 times per day 02/12/20   [name], MD  amLODipine (NORVASC) 10 MG tablet Take 1 tablet (10 mg total) by mouth daily. 05/25/19   [name], MD    atorvastatin (LIPITOR) 20 MG tablet Take 1 tablet (20 mg total) by mouth daily. 03/16/18   Angiulli, Lavon Paganini, PA-C  Blood Glucose Monitoring Suppl (ACCU-CHEK NANO SMARTVIEW) w/Device KIT Use to check blood sugar 3 times daily Patient taking differently: 1 each by Other route See admin instructions. Use to check blood sugar 3 times daily 12/11/19   Shamleffer, Melanie Crazier, MD  budesonide-formoterol (SYMBICORT) 160-4.5 MCG/ACT inhaler inhale 2 PUFFS into THE lungs 2 TIMES DAILY Patient taking differently: Inhale 2 puffs into the lungs 2 (two) times daily.  07/11/19   [name], MD  celecoxib (CELEBREX) 100 MG capsule Take 1 capsule (100 mg total) by mouth 2 (two) times daily. 01/24/20   Margarita Mail, PA-C  cetirizine (ZYRTEC) 10 MG tablet Take 10 mg by mouth daily as needed for allergies.    [provider]  Cholecalciferol 1000 units tablet Take 1 tablet (1,000 Units total) by mouth daily. 04/05/17   [name], MD  cyclobenzaprine (FLEXERIL) 5 MG tablet TAKE 1 TABLET BY MOUTH 3 TIMES DAILY AS NEEDED FOR MUSCLE SPASMS Patient taking differently: Take 5 mg by mouth 3 (three) times daily as needed for muscle spasms.  02/14/20   [name], MD  gabapentin (NEURONTIN) 600 MG tablet Take 2 tablets (1,200 mg total) by mouth 3 (three) times daily. 12/10/19   [name], MD  glucose blood (ACCU-CHEK GUIDE) test strip USE T0 CHECK BLOOD SUGAR 3 TIMES DAILY Patient taking differently: 1 each by Other route See admin instructions. USE T0 CHECK BLOOD SUGAR 3 TIMES DAILY 12/11/19   Shamleffer, Melanie Crazier, MD  HYDROcodone-acetaminophen (NORCO) 10-325 MG tablet Take one tablet every 6 to 8 hours as needed. Patient taking differently: Take 1 tablet by mouth See admin instructions. Take one tablet every 6 to 8 hours as needed. 02/12/20   [name], MD  insulin isophane & regular human (HUMULIN 70/30 MIX) (70-30) 100 UNIT/ML KwikPen Inject 75 Units into the  skin daily with breakfast AND 40 Units  daily with supper. 02/12/20   [name], MD  nitrofurantoin, macrocrystal-monohydrate, (MACROBID) 100 MG capsule Take 1 capsule (100 mg total) by mouth 2 (two) times daily. 01/15/20   [name], MD  ondansetron (ZOFRAN ODT) 4 MG disintegrating tablet Take 1 tablet (4 mg total) by mouth every 8 (eight) hours as needed for nausea or vomiting. 03/26/20   Temari Schooler, Verline Lema E, PA-C  pantoprazole (PROTONIX) 40 MG tablet Take 1 tablet (40 mg total) by mouth daily. 03/16/18   Angiulli, Lavon Paganini, PA-C  PROVENTIL HFA 108 (90 Base) MCG/ACT inhaler Inhale 1-2 puffs into the lungs every 6 (six) hours as needed for wheezing or shortness of breath. Patient taking differently: Inhale 2 puffs into the lungs every 4 (four) hours as needed for wheezing.  05/11/19   [name], MD  rOPINIRole (REQUIP) 0.5 MG tablet Take 1 tablet (0.5 mg total) by mouth at bedtime. 03/14/20   Martyn Malay, MD  Semaglutide,0.25 or 0.5MG/DOS, (OZEMPIC, 0.25 OR 0.5 MG/DOSE,) 2 MG/1.5ML SOPN Inject 0.5 mg into the skin once a week. Patient taking differently: Inject 0.5 mg into the skin every Wednesday.  08/15/19   Shamleffer, Melanie Crazier, MD  terconazole (TERAZOL 7) 0.4 % vaginal cream Place 1 applicator vaginally at bedtime. 02/12/20   [name], MD    Allergies    Kiwi extract, Cefepime, Morphine and related, Trental [pentoxifylline], and Nubain [nalbuphine hcl]  Review of Systems   Review of Systems  All other systems are reviewed and are negative for acute change except as noted in the HPI.   Physical Exam Updated Vital Signs BP 122/80 (BP Location: Left Arm)   Pulse (!) 103   Temp 98.8 F (37.1 C) (Oral)   Resp 18   Ht _0  (1.575 m)   Wt (!) 140.2 kg   LMP 02/24/2020   SpO2 98%   BMI 56.52 kg/m   Physical Exam Vitals and nursing note reviewed.  Constitutional:      General: She is not in acute distress.    Appearance: She is obese. She is  not ill-appearing.  HENT:     Head: Normocephalic and atraumatic.     Comments: No sinus or temporal tenderness.     Right Ear: Tympanic membrane and external ear normal.     Left Ear: Tympanic membrane and external ear normal.     Nose: Nose normal.     Mouth/Throat:     Mouth: Mucous membranes are moist.     Pharynx: Oropharynx is clear.  Eyes:     General: No scleral icterus.       Right eye: No discharge.        Left eye: No discharge.     Extraocular Movements: Extraocular movements intact.     Conjunctiva/sclera: Conjunctivae normal.     Pupils: Pupils are equal, round, and reactive to light.  Neck:     Vascular: No JVD.     Comments: No meningeal signs. Cardiovascular:     Rate and Rhythm: Normal rate and regular rhythm.     Pulses: Normal pulses.          Radial pulses are 2+ on the right side and 2+ on the left side.     Heart sounds: Normal heart sounds.  Pulmonary:     Comments: Lungs clear to auscultation in all fields. Symmetric chest rise. No wheezing, rales, or rhonchi. Oxygen saturation is 96% on room air. Chest:  Comments: Tenderness to palpation as depicted in image above.  No overlying skin changes. Abdominal:     Comments: Abdomen is soft, non-distended, and non-tender in all quadrants. No rigidity, no guarding. No peritoneal signs.  Musculoskeletal:        General: Normal range of motion.     Cervical back: Normal range of motion.     Comments: Left BKA.  Right foot with amputated toes.  Skin:    General: Skin is warm and dry.     Capillary Refill: Capillary refill takes less than 2 seconds.  Neurological:     Mental Status: She is oriented to person, place, and time.     GCS: GCS eye subscore is 4. GCS verbal subscore is 5. GCS motor subscore is 6.     Comments: Fluent speech, no facial droop.  Psychiatric:        Behavior: Behavior normal.       ED Results / Procedures / Treatments   Labs (all labs ordered are listed, but only abnormal  results are displayed) Labs Reviewed  CBC WITH DIFFERENTIAL/PLATELET - Abnormal; Notable for the following components:      Result Value   Abs Immature Granulocytes 0.10 (*)    All other components within normal limits  URINALYSIS, ROUTINE W REFLEX MICROSCOPIC - Abnormal; Notable for the following components:   Color, Urine STRAW (*)    Glucose, UA >=500 (*)    Hgb urine dipstick SMALL (*)    All other components within normal limits  COMPREHENSIVE METABOLIC PANEL - Abnormal; Notable for the following components:   Sodium 129 (*)    Chloride 95 (*)    CO2 21 (*)    Glucose, Bld 576 (*)    BUN 37 (*)    Creatinine, Ser 1.24 (*)    Calcium 8.6 (*)    Albumin 2.7 (*)    AST 11 (*)    Alkaline Phosphatase 172 (*)    Total Bilirubin 0.1 (*)    GFR calc non Af Amer 54 (*)    All other components within normal limits  CBG MONITORING, ED - Abnormal; Notable for the following components:   Glucose-Capillary 596 (*)    All other components within normal limits  CBG MONITORING, ED - Abnormal; Notable for the following components:   Glucose-Capillary 530 (*)    All other components within normal limits  CBG MONITORING, ED - Abnormal; Notable for the following components:   Glucose-Capillary 449 (*)    All other components within normal limits  I-STAT BETA HCG BLOOD, ED ([facility], [facility], [facility] ONLY)  TROPONIN I (HIGH SENSITIVITY)  TROPONIN I (HIGH SENSITIVITY)    EKG EKG Interpretation  Date/Time:  Wednesday March 26 2020 09:51:20 EDT Ventricular Rate:  98 PR Interval:    QRS Duration: 101 QT Interval:  360 QTC Calculation: 460 R Axis:   109 Text Interpretation: Sinus rhythm Right axis deviation Low voltage, precordial leads No STEMI Confirmed by Nanda Quinton [phone]) on 03/26/2020 10:04:44 AM   Radiology DG Chest Portable 1 View  Result Date: 03/26/2020 CLINICAL DATA:  Chest pain. EXAM: PORTABLE CHEST 1 VIEW COMPARISON:  None. FINDINGS: The heart size and mediastinal contours are within  normal limits. Both lungs are clear. No pneumothorax or pleural effusion is noted. The visualized skeletal structures are unremarkable. IMPRESSION: No active disease. Electronically Signed   By: Marijo Conception M.D.   On: 03/26/2020 10:08    Procedures Procedures (including critical care time)  Medications  Ordered in ED Medications  sodium chloride 0.9 % bolus 1,000 mL (0 mLs Intravenous Stopped 03/26/20 1302)  acetaminophen (TYLENOL) tablet 650 mg (650 mg Oral Given 03/26/20 0950)  ondansetron (ZOFRAN) injection 4 mg (4 mg Intravenous Given 03/26/20 1139)  fentaNYL (SUBLIMAZE) injection 50 mcg (50 mcg Intravenous Given 03/26/20 1247)  insulin aspart (novoLOG) injection 15 Units (15 Units Subcutaneous Given 03/26/20 1336)    ED Course  I have reviewed the triage vital signs and the nursing notes.  Pertinent labs & imaging results that were available during my care of the patient were reviewed by me and considered in my medical decision making (see chart for details).  Clinical Course as of Mar 26 1618  Wed Mar 26, 2020  1351 Recheck CBG shows glucose still elevated at 530.  Will give subcu insulin.  Glucose-Capillary(!!): 530 [KA]    Clinical Course User Index [KA] Dayle Mcnerney, Erie Noe   MDM Rules/Calculators/A&P                      Patient presents to the emergency department with chest pain. Patient nontoxic appearing, in no apparent distress, vitals without significant abnormality. Fairly benign physical exam. DDX: ACS, pulmonary embolism, dissection, pneumothorax, effusion, infiltrate, arrhythmia, anemia, electrolyte derangement, MSK. Evaluation initiated with labs, EKG, and CXR. Patient on cardiac monitor.  Labs here show no leukocytosis, no anemia.  CBG on arrival was 596.  CMP shows multiple electrolyte abnormalities including hyponatremia of 129 consistent with her baseline.  Creatinine is slightly elevated at 1.24, normal anion gap.  She does not appear to be in DKA.  UA is  without ketones or signs of infection.  First troponin is 15.  Patient given IV fluids and subcu insulin.  On reassessment her for shows to be downtrending at 449.  I viewed pt's chest xray and it does not suggest acute infectious processes EKG without ischemia.  Patient has a HEAR score of 3.  She was given a dose of pain medicine and chest pain has since resolved.  She is tolerating p.o. intake while here in the emergency department. Case has been discussed with Dr. Laverta Baltimore who agrees with plan of care.  Patient care transferred to L. Joldersma PA-C at the end of my shift pending second troponin. Patient presentation, ED course, and plan of care discussed with review of all pertinent labs and imaging. Please see his note for further details regarding further ED course and disposition.  Anticipate discharge home if delta troponin flat.  I have e-prescribed Zofran to her pharmacy for use as needed.    Portions of this note were generated with [employer]. Dictation errors may occur despite best attempts at proofreading.   Final Clinical Impression(s) / ED Diagnoses Final diagnoses:  Atypical chest pain    Rx / DC Orders ED Discharge Orders         Ordered    ondansetron (ZOFRAN ODT) 4 MG disintegrating tablet  Every 8 hours PRN     03/26/20 1614           Esaiah Wanless, [place], PA-C 03/26/20 1619    Long, Wonda Olds, MD 03/26/20 [phone]

## 2020-03-26 NOTE — ED Notes (Signed)
Patient daughter here to pick up. Transferred to wheelchair

## 2020-03-26 NOTE — ED Triage Notes (Addendum)
To ED via [facility] from home with c/o ongoing chestpain and diarrhea intermittently for 3-4 days. For EMS pt had a temperature of 101.8 -- 98.8 here. CBG = 563 , received ASA 324mg , NTG x 1 sl--   Pt has not had insulin this am, has not eaten either

## 2020-03-26 NOTE — Telephone Encounter (Signed)
**  After hours emergency line call**  [name] is a 44 year old female with poorly controlled T2DM (last A1c in system 17.5 in 05/2019), hypertension, hyperlipidemia, class III obesity, OSA, and s/p left BKA: Into the after-hours for shortness of breath and chest pain.  Reports a 3-day history of difficulty breathing, increasing swelling legs, and feeling "like someone is sitting on my chest."  She decided to call this morning as her breathing was getting worse.  She has only walked to the bathroom and back to her bed since this started, has not been able to eat well due to worsening breathing while she takes breaks to chew.  Has not had anything like this before.  Chest pain/pressure is worse when she walks around, better when she sits but still present.  Hears a little bit of wheeze with her breathing, does have history of asthma but has run out of her home inhalers.  Does not have any ASA at home.  During our conversation, she is stopping sentences to breathe.  No one else at home other than her 4 year old daughter.  Recommended calling EMS for transport to the ED for evaluation and stabilization.  She is in agreement with this plan. Main differential including MI, new onset heart failure with exacerbation, Covid, Asthma exacerbation.   Will route to PCP.   Patriciaann Clan, DO

## 2020-03-26 NOTE — ED Notes (Signed)
Got patient undress on the monitor patient is resting with call bell in reach 

## 2020-03-26 NOTE — ED Provider Notes (Signed)
HPI Comments: [name] is a 44 year old female that was transferred to me at shift change.  For full HPI details please see note from PA-C [place].  Patient presents today with intermittent chest pain and diarrhea.  She has a history of obesity and type 2 diabetes with multiple amputations.  Physical Exam  BP 117/82   Pulse 95   Temp 98.8 F (37.1 C) (Oral)   Resp 18   Ht 5\' 2"  (1.575 m)   Wt (!) 140.2 kg   LMP 02/24/2020   SpO2 95%   BMI 56.52 kg/m    Physical Exam Vitals and nursing note reviewed.  Constitutional:      General: She is not in acute distress.    Appearance: She is well-developed. She is obese. She is not ill-appearing, toxic-appearing or diaphoretic.  HENT:     Head: Normocephalic and atraumatic.  Eyes:     Extraocular Movements: Extraocular movements intact.  Cardiovascular:     Rate and Rhythm: Normal rate.  Pulmonary:     Effort: Pulmonary effort is normal.  Abdominal:     Palpations: Abdomen is soft.  Musculoskeletal:     Cervical back: Normal range of motion.     Comments: BKA noted in the left lower extremity  Skin:    General: Skin is warm and dry.  Neurological:     General: No focal deficit present.     Mental Status: She is alert and oriented to person, place, and time.  Psychiatric:        Mood and Affect: Mood normal.        Behavior: Behavior normal.    ED Course/Procedures   Clinical Course as of Mar 26 1546  Wed Mar 26, 2020  1351 Recheck CBG shows glucose still elevated at 530.  Will give subcu insulin.  Glucose-Capillary(!!): 530 [KA]    Clinical Course User Index [KA] Albrizze, Kaitlyn E, PA-C    Procedures  MDM  Second troponin was negative.  Her sugars improved to 449 from nearly 600 upon arrival.  I discussed with patient and her mother.  Patient states that she has been working on her insulin regimen for long period of time with her primary care provider.  States she has a follow-up with her primary care provider  next week.  She states she feels better since arriving and is ready to be discharged.  Their questions were answered and they were amicable at the time of discharge.  Vital signs stable.      Rayna Sexton, PA-C 03/26/20 1655    Carmin Muskrat, MD 03/31/20 Dyann Kief

## 2020-03-26 NOTE — Discharge Instructions (Addendum)
You have been seen today for chest pain and diarrhea. Please read and follow all provided instructions. Return to the emergency room for worsening condition or new concerning symptoms.    1. Medications:  Prescription sent to your pharmacy for Zofran.  This is used to treat nausea.  Please take as prescribed and if needed.  Continue usual home medications Take medications as prescribed. Please review all of the medicines and only take them if you do not have an allergy to them.   2. Treatment: rest, drink plenty of fluids.  It is very important that you take your insulin as prescribed and closely monitor your blood sugar.  3. Follow Up:  Please follow up with primary care provider by scheduling an appointment as soon as possible for a visit     It is also a possibility that you have an allergic reaction to any of the medicines that you have been prescribed - Everybody reacts differently to medications and while MOST people have no trouble with most medicines, you may have a reaction such as nausea, vomiting, rash, swelling, shortness of breath. If this is the case, please stop taking the medicine immediately and contact your physician.  ?

## 2020-03-27 DIAGNOSIS — E1140 Type 2 diabetes mellitus with diabetic neuropathy, unspecified: Secondary | ICD-10-CM | POA: Diagnosis not present

## 2020-03-28 DIAGNOSIS — E1140 Type 2 diabetes mellitus with diabetic neuropathy, unspecified: Secondary | ICD-10-CM | POA: Diagnosis not present

## 2020-03-29 DIAGNOSIS — E1140 Type 2 diabetes mellitus with diabetic neuropathy, unspecified: Secondary | ICD-10-CM | POA: Diagnosis not present

## 2020-03-30 DIAGNOSIS — E1140 Type 2 diabetes mellitus with diabetic neuropathy, unspecified: Secondary | ICD-10-CM | POA: Diagnosis not present

## 2020-03-31 DIAGNOSIS — E1140 Type 2 diabetes mellitus with diabetic neuropathy, unspecified: Secondary | ICD-10-CM | POA: Diagnosis not present

## 2020-04-01 ENCOUNTER — Telehealth: Payer: Self-pay | Admitting: Family Medicine

## 2020-04-01 DIAGNOSIS — E1140 Type 2 diabetes mellitus with diabetic neuropathy, unspecified: Secondary | ICD-10-CM | POA: Diagnosis not present

## 2020-04-01 NOTE — Telephone Encounter (Signed)
Pt [name] called trying to reach the assistant to Dr. Valentina Lucks. Please call [phone]  Coming in on Thursday and would like to speak with someone.

## 2020-04-01 NOTE — Telephone Encounter (Signed)
Pt asked to speak with me directly.  Her PA for hydrocodone will expire on 04/08/20 and she wants to know if we can process this early.  She is NOT out and is only requesting early so that we can start working on the [place].  Will forward to PCP.  If she is agreeable, will need new script sent and then we can start the process.  Christen Bame, CMA

## 2020-04-02 ENCOUNTER — Other Ambulatory Visit: Payer: Self-pay

## 2020-04-02 ENCOUNTER — Ambulatory Visit: Payer: Medicaid Other | Admitting: Physical Therapy

## 2020-04-02 ENCOUNTER — Encounter: Payer: Self-pay | Admitting: Physical Therapy

## 2020-04-02 DIAGNOSIS — R531 Weakness: Secondary | ICD-10-CM

## 2020-04-02 DIAGNOSIS — M79604 Pain in right leg: Secondary | ICD-10-CM | POA: Diagnosis not present

## 2020-04-02 DIAGNOSIS — E1140 Type 2 diabetes mellitus with diabetic neuropathy, unspecified: Secondary | ICD-10-CM | POA: Diagnosis not present

## 2020-04-02 DIAGNOSIS — R2689 Other abnormalities of gait and mobility: Secondary | ICD-10-CM | POA: Diagnosis not present

## 2020-04-02 DIAGNOSIS — M25652 Stiffness of left hip, not elsewhere classified: Secondary | ICD-10-CM | POA: Diagnosis not present

## 2020-04-02 DIAGNOSIS — M25651 Stiffness of right hip, not elsewhere classified: Secondary | ICD-10-CM | POA: Diagnosis not present

## 2020-04-02 DIAGNOSIS — R293 Abnormal posture: Secondary | ICD-10-CM | POA: Diagnosis not present

## 2020-04-02 DIAGNOSIS — R2681 Unsteadiness on feet: Secondary | ICD-10-CM | POA: Diagnosis not present

## 2020-04-02 DIAGNOSIS — M21371 Foot drop, right foot: Secondary | ICD-10-CM

## 2020-04-02 DIAGNOSIS — M25661 Stiffness of right knee, not elsewhere classified: Secondary | ICD-10-CM

## 2020-04-02 NOTE — Therapy (Signed)
[facility] [address] [place], [place], [address] Phone: [phone]   Fax:  [phone]  Physical Therapy Treatment  Patient Details  Name: [name] MRN: [id number] Date of Birth: 42-Apr-1977 Referring Provider (PT): [name], MD   Encounter Date: 04/02/2020  PT End of Session - 04/02/20 1154    Visit Number  11    Number of Visits  21    Authorization Type  Medicaid    Authorization Time Period  12 visits 01/23/2020 - 04/15/2020    Authorization - Visit Number  4    Authorization - Number of Visits  12    PT Start Time  1100    PT Stop Time  1145    PT Time Calculation (min)  45 min    Equipment Utilized During Treatment  Gait belt    Activity Tolerance  Patient tolerated treatment well;Patient limited by fatigue    Behavior During Therapy  [facility] for tasks assessed/performed       Past Medical History:  Diagnosis Date  . Acute osteomyelitis of ankle or foot, left ([place]) 01/31/2018  . Alveolar hypoventilation   . Anemia    not on iron pill  . Asthma   . Bipolar 2 disorder ([place])   . Carpal tunnel syndrome on right    recurrent  . Cellulitis 08/2010-08/2011  . Chronic pain   . COPD (chronic obstructive pulmonary disease) ([name])    Symbicort daily and Proventil as needed  . Costochondritis   . Depression   . Diabetes mellitus type II, uncontrolled ([place]) 2000   Type 2, Uncontrolled.Takes Lantus daily.Fasting blood sugar runs 150  . Dizziness    occasionally  . Drug-seeking behavior   . GERD (gastroesophageal reflux disease)    takes Pantoprazole and Zantac daily  . Headache    migraine-last one about a yr ago.Topamax daily  . HLD (hyperlipidemia)    takes Atorvastatin daily  . Hypertension    takes Lisinopril and Coreg daily  . Morbid obesity ([place])   . Muscle spasm    takes Flexeril as needed  . Nocturia   . OSA on CPAP   . Peripheral neuropathy    takes Gabapentin daily  . Pneumonia     "walking" several yrs ago and as a baby (12/05/2018)  . Rectal fissure   . Restless leg   . Syncope 02/25/2016  . Urinary frequency   . Varicose veins    Right medial thigh and Left leg     Past Surgical History:  Procedure Laterality Date  . AMPUTATION Left 02/01/2018   Procedure: LEFT FOURTH AND 5TH TOE RAY AMPUTATION;  Surgeon: [name], MD;  Location: [facility];  Service: Orthopedics;  Laterality: Left;  . AMPUTATION Left 03/03/2018   Procedure: LEFT BELOW KNEE AMPUTATION;  Surgeon: [name], MD;  Location: [facility];  Service: Orthopedics;  Laterality: Left;  . CARPAL TUNNEL RELEASE Bilateral   . CESAREAN SECTION  2007  . INCISION AND DRAINAGE PERIRECTAL ABSCESS Left 05/18/2019   Procedure: IRRIGATION AND DEBRIDEMENT OF PANNIS ABSCESS, POSSIBLE DEBRIDEMENT OF BUTTOCK WOUND;  Surgeon: Donnie Mesa, MD;  Location: [facility];  Service: General;  Laterality: Left;  . IRRIGATION AND DEBRIDEMENT BUTTOCKS Left 05/17/2019   Procedure: IRRIGATION AND DEBRIDEMENT BUTTOCKS;  Surgeon: Donnie Mesa, MD;  Location: [facility];  Service: General;  Laterality: Left;  . KNEE ARTHROSCOPY Right 07/17/2010  . LEFT HEART CATHETERIZATION WITH CORONARY ANGIOGRAM N/A 07/27/2012   Procedure: LEFT  HEART CATHETERIZATION WITH CORONARY ANGIOGRAM;  Surgeon: Sherren Mocha, MD;  Location: [facility] CATH LAB;  Service: Cardiovascular;  Laterality: N/A;  . MASS EXCISION N/A 06/29/2013   Procedure:  WIDE LOCAL EXCISION OF POSTERIOR NECK ABSCESS;  Surgeon: Ralene Ok, MD;  Location: [facility];  Service: General;  Laterality: N/A;  . REPAIR KNEE LIGAMENT Left    "fixed ligaments and chipped patella"  . right transmetatarsal amputation       There were no vitals filed for this visit.  Subjective Assessment - 04/02/20 1056    Subjective  She got AFO 1-2 weeks ago but has not worn as calf is swollen so unable to fit.  She went to ED for breathing issue but said related to DM. She has PCP appt tomorrow.    Pertinent History  Rt  Transmetatarsal Amputation, LTTA, OA, COPD, asthma, uncontrolled DM2, depression, Neuropathy, Bipolar, carpal tunnel, morbid obesity, syncope    Limitations  Lifting;Standing;Walking;House hold activities    Patient Stated Goals  To walk in house & community    Currently in Pain?  No/denies    Pain Onset  More than a month ago                       [facility] Adult PT Treatment/Exercise - 04/02/20 1100      Transfers   Transfers  Sit to Stand;Stand to Sit    Sit to Stand  5: Supervision;With upper extremity assist;From chair/3-in-1    Stand to Sit  5: Supervision;With upper extremity assist;To chair/3-in-1      Ambulation/Gait   Ambulation/Gait  Yes    Ambulation/Gait Assistance  4: Min guard    Ambulation/Gait Assistance Details  verbal cues on upright posture & wt shift over stance limb    Ambulation Distance (Feet)  10 Feet   10' X 2   Assistive device  Rolling walker;Prosthesis;Other (Comment)   right AFO   Gait Pattern  Step-to pattern;Decreased stride length;Decreased dorsiflexion - right;Decreased weight shift to left;Right foot flat;Antalgic;Lateral hip instability;Trunk flexed;Decreased step length - right;Decreased stance time - left;Right flexed knee in stance;Left flexed knee in stance    Ambulation Surface  Level;Indoor    Gait Comments  Step up with 4" block with RW, prosthesis & AFO - arising with LLE (prosthesis) each rep & descend with RLE 2 reps & LLE 2 reps (better leading with prosthesis) - 1 rep, 2 reps, 1 rep      Self-Care   ADL's  --      Therapeutic Activites    Other Therapeutic Activities  --      Prosthetics   Prosthetic Care Comments   PT donned AFO with instruction in proper technique to donne & recommend shoes with increased toe box depth & posterior shoe depth. Pt reports that she has pair at home that should work.  PT recommended wearing 2hrs 2x/day over week.  Patient verbalized understanding.      Current prosthetic wear tolerance  (days/week)   daily    Current prosthetic wear tolerance (#hours/day)   She reports prosthesis wear most of time out of bed during day.      Residual limb condition   intact with no issues    Education Provided  Proper wear schedule/adjustment;Other (comment)   see prosthetic care comments   Person(s) Educated  Patient    Education Method  Explanation;Verbal cues    Education Method  Verbalized understanding;Verbal cues required;Needs further instruction  PT Short Term Goals - 04/02/20 1154      PT SHORT TERM GOAL #1   Title  Patient verbalizes consistant daily wear of prosthesis for 6 hours total / day. (All updated STGs target date 02/17/2020)    Baseline  MET 04/02/2020    Time  4    Period  Weeks    Status  Achieved    Target Date  03/19/20      PT SHORT TERM GOAL #2   Title  Patient tolerates 5 minutes of standing activities with UE support with vital signs WNL.    Baseline  NOT MET 04/02/2020 Patient only tolerates up to 3 minutes of standing before dyspnea & fatigue feeling limiting    Time  4    Period  Weeks    Status  Not Met    Target Date  03/19/20      PT SHORT TERM GOAL #3   Title  Patient able to reach to floor with RW support with supervision.    Time  4    Period  Weeks    Status  Not Met    Target Date  03/19/20      PT SHORT TERM GOAL #4   Title  Patient ambulates 31' with RW & prosthesis and GrAFO with supervision.    Time  4    Period  Weeks    Status  Not Met    Target Date  03/19/20      PT SHORT TERM GOAL #5   Title  Patient negotiates ramp & curb with RW, prosthesis & GrAFO with modA.    Time  4    Period  Weeks    Status  Not Met    Target Date  03/19/20        PT Long Term Goals - 01/16/20 1510      PT LONG TERM GOAL #1   Title  Patient verbalizes & demonstrates proper orthotic care and maintains current mod-I prosthetic care ability to enable safe use of orthosis & prosthesis. (All LTGs Target Date 04/11/2020)     Baseline  12/05/2019  Patient is scheduled to receive her first AFO on 12/11/2018 and is dependent in care / use.    Time  12    Period  Weeks    Status  On-going    Target Date  04/11/20      PT LONG TERM GOAL #2   Title  Patient tolerates prosthetic liner wear for >90% and prosthetic wear >50% of awake hours without skin integrity issues for improved mobility.    Baseline  12/05/19: wears liner most wake hours 8-10, wears prosthesis 4-6 hours daily. She is scheduled to receive AFO on 12/11/2018 so no wear yet.    Time  12    Period  Weeks    Status  On-going    Target Date  04/11/20      PT LONG TERM GOAL #3   Title  Patient reports pain increases </= 3 increments with standing & gait increased activities noted in LTGs with RW, prosthesis, and orthosis.    Baseline  12/05/19: Patient reports bilateral knee pain up to 8/10 with prosthesis wear, standing & gait with baseline at lowest 5/10    Time  13    Period  Weeks    Status  On-going    Target Date  04/11/20      PT LONG TERM GOAL #4   Title  Standing balance with RW  support reaching 10" anteriorly, picking up objects from floor & scanning environment safely modified independent.    Baseline  12/05/19: Standing balance with RW support with moderate assist reaching 10" anteriorly, pick up object on floor 7.5", able to look over bilateral shoulder with wt shift with supervision-minA    Time  13    Period  Weeks    Status  On-going    Target Date  04/11/20      PT LONG TERM GOAL #5   Title  Patient ambulates 81' with RW & prosthesis & orthosis modified independent to enable basic community entry.    Baseline  12/05/19: Patient ambulates 60' with RW & prosthesis & clinic's GrAFO with minA    Time  13    Period  Weeks    Status  On-going    Target Date  04/11/20      PT LONG TERM GOAL #6   Title  Patient negotiates ramp & curb with RW & prosthesis & orthosis minA from family to enable community access.    Baseline  12/05/19:  Patient is non-ambulatory on stairs, ramps & curbs limiting community mobility.    Time  13    Period  Weeks    Status  On-going    Target Date  04/11/20            Plan - 04/02/20 1157    Clinical Impression Statement  Patient continues to fatigue easily with activities. She recieved her AFO for RLE. Her shoe is too shallow making AFO less effective. She verbalizes understanding of better shoe design and reports has one at home to try.  Today was visit 11 with evaluation on 09/12/2019 over 6 months ago. Patient's attendance and delay in recieving AFO along with complicated medical history including uncontrolled diabetes has slowed progress.    Personal Factors and Comorbidities  Comorbidity 3+;Fitness;Past/Current Experience;Finances;Time since onset of injury/illness/exacerbation    Comorbidities  left Transtibial amputation, right Transmetatarsal amputation, OA, asthma, DM, depression, neuropathy, Bipolar    Examination-Activity Limitations  Bed Mobility;Caring for Others;Carry;Lift;Locomotion Level;Stairs;Stand;Transfers    Examination-Participation Restrictions  Community Activity;Driving;Meal Prep;Other   parenting 42yo dtr   Stability/Clinical Decision Making  Evolving/Moderate complexity    Rehab Potential  Good    PT Frequency  1x / week   1x/wk for 3 weeks, then 2x/wk for 6 weeks   PT Duration  12 weeks    PT Treatment/Interventions  ADLs/Self Care Home Management;DME Instruction;Gait training;Stair training;Functional mobility training;Therapeutic activities;Therapeutic exercise;Balance training;Neuromuscular re-education;Patient/family education;Prosthetic Training;Orthotic Fit/Training;Vestibular    PT Next Visit Plan  work on standing tolerance / balance & gait with prosthesis & AFO.    Consulted and Agree with Plan of Care  Patient       Patient will benefit from skilled therapeutic intervention in order to improve the following deficits and impairments:  Abnormal gait,  Decreased activity tolerance, Decreased balance, Decreased endurance, Decreased knowledge of use of DME, Decreased mobility, Decreased range of motion, Decreased strength, Dizziness, Impaired flexibility, Postural dysfunction, Prosthetic Dependency, Obesity, Pain  Visit Diagnosis: Other abnormalities of gait and mobility  Unsteadiness on feet  Abnormal posture  Weakness generalized  Pain in right leg  Stiffness of right hip, not elsewhere classified  Foot drop, right  Stiffness of left hip, not elsewhere classified  Stiffness of right knee, not elsewhere classified     Problem List Patient Active Problem List   Diagnosis Date Noted  . Status post transmetatarsal amputation of right foot ([place]) 02/13/2020  .  UTI (urinary tract infection) 06/28/2019  . Right hand pain 06/23/2019  . Amputation above knee ([place]) 06/23/2019  . Left shoulder pain 06/23/2019  . Dysuria 06/14/2019  . Abdominal pain   . Abscess of skin of abdomen   . Abscess of buttock, right 05/17/2019  . Severe protein-calorie malnutrition ([place])   . 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[place]) 04/11/2019  . Type 2 diabetes mellitus with diabetic polyneuropathy, with long-term current use of insulin ([place]) 04/11/2019  . Type 2 diabetes mellitus with hyperglycemia, with long-term current use of insulin ([place]) 04/11/2019  . Cellulitis of great toe of right foot 10/29/2018  . Uncontrolled type 2 diabetes mellitus with insulin therapy ([place]) 04/16/2018  . Allergic rhinitis 03/06/2018  . Unilateral complete BKA, left, sequela ([place])   . Class 3 severe obesity due to excess calories with serious comorbidity and body mass index (BMI) of 50.0 to 59.9 in adult [facility])   . AKI (acute kidney injury) ([place])   . Decreased pedal pulses 06/10/2017  . Unilateral primary osteoarthritis, right knee 10/22/2016  . Primary osteoarthritis of first carpometacarpal joint of left hand 07/30/2016  . Diabetic neuropathy ([place]) 07/14/2016  . Nausea 03/17/2016   . De Quervain's tenosynovitis, bilateral 11/01/2015  . Vitamin D deficiency 09/05/2015  . Recurrent candidiasis of vagina 09/05/2015  . Varicose veins of leg with complications 27/25/3664  . Restless leg syndrome 10/17/2014  . Chronic sinusitis 07/18/2014  . Encounter for chronic pain management 06/30/2013  . HLD (hyperlipidemia) 11/19/2012  . Chest pain 06/27/2012  . Right carpal tunnel syndrome 09/01/2011  . Bilateral knee pain 09/01/2011  . DM (diabetes mellitus) type II uncontrolled, periph vascular disorder ([place]) 05/22/2008  . Morbid obesity ([place]) 05/22/2008  . 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 Depression 05/22/2008  . Obstructive sleep apnea 05/22/2008  . Hypertension 05/22/2008  . Asthma 05/22/2008  . GERD 05/22/2008    Jamey Reas PT, DPT 04/02/2020, 1:41 PM  [facility] [address] [place], [place], [address] Phone: [phone]   Fax:  [phone]  Name: Shelonda Saxe MRN: [id number] Date of Birth: [birth year]

## 2020-04-03 ENCOUNTER — Other Ambulatory Visit: Payer: Self-pay

## 2020-04-03 ENCOUNTER — Ambulatory Visit (INDEPENDENT_AMBULATORY_CARE_PROVIDER_SITE_OTHER): Payer: Medicaid Other | Admitting: Family Medicine

## 2020-04-03 VITALS — BP 118/70 | HR 101 | Wt 334.0 lb

## 2020-04-03 DIAGNOSIS — B373 Candidiasis of vulva and vagina: Secondary | ICD-10-CM | POA: Diagnosis not present

## 2020-04-03 DIAGNOSIS — G2581 Restless legs syndrome: Secondary | ICD-10-CM | POA: Diagnosis not present

## 2020-04-03 DIAGNOSIS — E785 Hyperlipidemia, unspecified: Secondary | ICD-10-CM

## 2020-04-03 DIAGNOSIS — IMO0002 Reserved for concepts with insufficient information to code with codable children: Secondary | ICD-10-CM

## 2020-04-03 DIAGNOSIS — E1151 Type 2 diabetes mellitus with diabetic peripheral angiopathy without gangrene: Secondary | ICD-10-CM

## 2020-04-03 DIAGNOSIS — K5900 Constipation, unspecified: Secondary | ICD-10-CM | POA: Diagnosis not present

## 2020-04-03 DIAGNOSIS — G8929 Other chronic pain: Secondary | ICD-10-CM | POA: Diagnosis not present

## 2020-04-03 DIAGNOSIS — E1165 Type 2 diabetes mellitus with hyperglycemia: Secondary | ICD-10-CM

## 2020-04-03 DIAGNOSIS — B3731 Acute candidiasis of vulva and vagina: Secondary | ICD-10-CM

## 2020-04-03 DIAGNOSIS — E1140 Type 2 diabetes mellitus with diabetic neuropathy, unspecified: Secondary | ICD-10-CM | POA: Diagnosis not present

## 2020-04-03 LAB — POCT GLYCOSYLATED HEMOGLOBIN (HGB A1C): HbA1c POC (<> result, manual entry): >15 % (ref 4.0–5.6)

## 2020-04-03 MED ORDER — ROPINIROLE HCL 1 MG PO TABS: 1.0000 mg | ORAL_TABLET | Freq: Every day | ORAL | 0 refills | Status: DC

## 2020-04-03 MED ORDER — INSULIN ISOPHANE & REGULAR (HUMAN 70-30)100 UNIT/ML KWIKPEN: PEN_INJECTOR | SUBCUTANEOUS | Status: DC

## 2020-04-03 MED ORDER — TERCONAZOLE 0.4 % VA CREA: 1.0000 | TOPICAL_CREAM | Freq: Every day | VAGINAL | 0 refills | Status: DC

## 2020-04-03 MED ORDER — HYDROCODONE-ACETAMINOPHEN 10-325 MG PO TABS: ORAL_TABLET | ORAL | 0 refills | Status: DC

## 2020-04-03 MED ORDER — POLYETHYLENE GLYCOL 3350 17 GM/SCOOP PO POWD: 17.0000 g | Freq: Every day | ORAL | 1 refills | Status: DC

## 2020-04-03 NOTE — Assessment & Plan Note (Addendum)
A1c today at >15. Increasing insulin 70/30 to 80 units with breakfast, and 45 units with supper. Extensively counseled on healthier eating choices and exercises that can be done while sitting. She feels motivated to adhere with lifestyle changes given 42 yo daughter's diabetes diagnosis (newly diagnosed today). Will follow up in 1 month to see how she is doing with these changes. Additionally, she has an appointment scheduled with Dr. Valentina Lucks to adjust her insulin.

## 2020-04-03 NOTE — Progress Notes (Signed)
SUBJECTIVE:   CHIEF COMPLAINT / HPI:   [name] presents for follow up, accompanied by 42yo daughter [name].  Diabetes: Sugars have been running in 400s every morning even after increase in insulin. Has taken insulin as prescribed, is on insulin 70/30, takes 75u in AM and 40u in PM. She states that she has not been watching her diet and knows that she needs to start doing that. Has had her eyes checked less than 6 months ago. Has an appointment with retinal specialist tomorrow. Tearful at hearing that her 45 yo daughter has been diagnosed with type 2 diabetes today.  RLS - she endorses continued burning of the her legs and right foot, and feel to constantly move her legs during the night. The burning is worst at night. Her symptoms have decreased some (from 3-4 days a week to 1-2 days a week) since starting on ropinirole.   Foot drop - She was able to receive her AFO foot brace. Wore it for the first time last night for 4-5 hours and when she took it off she said her calf was swollen and hurt a lot.  Constipation: Endorses having trouble passing bowel movement. For the last couple of weeks she has had to strain a lot and sometime is not able to pass a bowel movement at all. When she is able to pass a bowel movement sometimes they her stool is described as hard ball, other times it is normal. No blood in stool. Has not tried anything for it. Would like rx for miralax  Vulvar itching - persists. Was dismissed from her OBGYN office for missing an appointment. Requests refill of terconazole. Helps with itching. Due for pap.  Sleep study: sleep study to calibrate CPAP scheduled for 04/17/2020.   Chronic pain - needs refill of norco. Tolerating well. Storing safely. 8 yo daughter knows never to take her mom's pain medication. No side effects of medication or excessive sedation.   Of note, patient has history of mood disorder, managed previously by psychiatry, not presently on mood medications. She  was given PHQ2 and 9 by clinic staff. Answered affirmatively to question #9 on PHQ9 (SI) but after discussing with patient, she denied any recent thoughts of harming herself or others. She misinterpreted the question to mean had she EVER had thoughts of harming herself. Can readdress mood at follow up, but given complexity and pressing nature of other issues addressed today did not discuss in more detail once confirming she has not had any true SI recently.   OBJECTIVE:   BP 118/70   Pulse (!) 101   Wt (!) 334 lb (151.5 kg)   SpO2 99%   BMI 61.09 kg/m   Gen: no acute distress, pleasant, cooperative HEENT - normocephalic, atraumatic  Lungs - normal work of breathing Extremities: RLE with swelling, no erythema, no warmth, no palpable cords. S/p R transmetatarsal amputation. Negative homans sign. Psych: normal range of affect, well groomed, speech normal in rate and volume, normal eye contact   ASSESSMENT/PLAN:   Health maintenance - needs pap smear done in room with electrical table, given mobility issues. Advised to schedule in colpo clinic for this & to assess vulvar itching - COVID vaccine: provided her with the information on how to sign up for her vaccine  DM (diabetes mellitus) type II uncontrolled, periph vascular disorder ([name]) A1c today at >15. Increasing insulin 70/30 to 80 units with breakfast, and 45 units with supper. Extensively counseled on healthier eating choices and exercises  that can be done while sitting. She feels motivated to adhere with lifestyle changes given 41 yo daughter's diabetes diagnosis (newly diagnosed today). Will follow up in 1 month to see how she is doing with these changes. Additionally, she has an appointment scheduled with Dr. Valentina Lucks to adjust her insulin.  Restless leg syndrome Uncontrolled. Increase Ropinirole to 1mg  nightly. Room to increase if this does not help. Follow up in 1 month .   Recurrent candidiasis of vagina Refilling terconazole 0.4%  vaginal cream as temporizing measure. Will need vulvar exam when she comes in for pap smear (having her scheduled in colpo clinic as will require electric lift table to complete pelvic/pap).   HLD (hyperlipidemia) Check fasting lipids at future lab visit when she follows up with Dr. Valentina Lucks.  Encounter for chronic pain management Pain stable overall. Patient requesting increase in dose, advised this would not be good care. Goal is to eventually get her off chronic narcotics. PDMP reviewed with appropriate findings. Rx sent in for 1 month. Follow up in 1 month   Constipation rx miralax.   [place]   Patient seen along with MS3 student [place]. I personally evaluated this patient along with the student, and verified all aspects of the history, physical exam, and medical decision making as documented by the student. I agree with the student's documentation and have made all necessary edits.  [name], MD [place] than 30 minutes were spent on this encounter on the day of service, including pre-visit planning, actual face to face time, coordination of care, and documentation of visit.

## 2020-04-03 NOTE — Assessment & Plan Note (Addendum)
Refilling terconazole 0.4% vaginal cream as temporizing measure. Will need vulvar exam when she comes in for pap smear (having her scheduled in colpo clinic as will require electric lift table to complete pelvic/pap).

## 2020-04-03 NOTE — Assessment & Plan Note (Signed)
Pain stable overall. Patient requesting increase in dose, advised this would not be good care. Goal is to eventually get her off chronic narcotics. PDMP reviewed with appropriate findings. Rx sent in for 1 month. Follow up in 1 month

## 2020-04-03 NOTE — Progress Notes (Signed)
Patient given PHQ2 and PHQ9.  Provider aware.  [name], [place]

## 2020-04-03 NOTE — Assessment & Plan Note (Signed)
rx miralax.

## 2020-04-03 NOTE — Patient Instructions (Addendum)
It was great to see you again today!  Increase ropinirole to 1mg  daily  Go to [email or web address] to schedule your vaccine  Sent in miralax for you, take daily or twice daily  Schedule an appointment in colposcopy clinic for your pap smear and pelvic exam  Go up on insulin to 80u in AM and 45u in PM  Refilled terconazole  Get fasting lipids checked when you come see Dr. Valentina Lucks  Be well, Dr. [name]

## 2020-04-03 NOTE — Assessment & Plan Note (Signed)
Check fasting lipids at future lab visit when she follows up with Dr. Valentina Lucks.

## 2020-04-03 NOTE — Assessment & Plan Note (Signed)
>>  ASSESSMENT AND PLAN FOR INSULIN DEPENDENT TYPE 2 DIABETES MELLITUS ([place]) WRITTEN ON 04/03/2020  5:02 PM BY Oley Lahaie, [name], MD  A1c today at >15. Increasing insulin 70/30 to 80 units with breakfast, and 45 units with supper. Extensively counseled on healthier eating choices and exercises that can be done while sitting. She feels motivated to adhere with lifestyle changes given 42 yo daughter's diabetes diagnosis (newly diagnosed today). Will follow up in 1 month to see how she is doing with these changes. Additionally, she has an appointment scheduled with Dr. Valentina Lucks to adjust her insulin.

## 2020-04-03 NOTE — Assessment & Plan Note (Addendum)
Uncontrolled. Increase Ropinirole to 1mg  nightly. Room to increase if this does not help. Follow up in 1 month .

## 2020-04-04 DIAGNOSIS — E113313 Type 2 diabetes mellitus with moderate nonproliferative diabetic retinopathy with macular edema, bilateral: Secondary | ICD-10-CM | POA: Diagnosis not present

## 2020-04-04 DIAGNOSIS — H43823 Vitreomacular adhesion, bilateral: Secondary | ICD-10-CM | POA: Diagnosis not present

## 2020-04-04 DIAGNOSIS — H35033 Hypertensive retinopathy, bilateral: Secondary | ICD-10-CM | POA: Diagnosis not present

## 2020-04-04 DIAGNOSIS — H2513 Age-related nuclear cataract, bilateral: Secondary | ICD-10-CM | POA: Diagnosis not present

## 2020-04-04 DIAGNOSIS — E1140 Type 2 diabetes mellitus with diabetic neuropathy, unspecified: Secondary | ICD-10-CM | POA: Diagnosis not present

## 2020-04-04 NOTE — Telephone Encounter (Signed)
I sent in a script for her yesterday during her visit. RN team, can you start the PA process? Thanks [name], MD

## 2020-04-04 NOTE — Telephone Encounter (Signed)
Patient requested to talk to me when she was at office yesterday (04/03/2020) for appt with Dr. [name].  Met patient outside of [facility] building. Patient requested pharmacy appt to discuss DM medications and CGM. She reported adherence to insulin, however, she stated her BG readings were consistently ~400 mg/dL. Advised patient to make appt with Dr. Valentina Lucks. Patient verbalized understanding. It appears she made appt with Dr. Valentina Lucks on 04/10/2020 at 9:00 AM. Will further assess DM management at that time.   Thank you for involving pharmacy to assist in providing this patient's care.   [name] Iha, PharmD PGY2 Ambulatory Care Pharmacy Resident

## 2020-04-05 DIAGNOSIS — E1140 Type 2 diabetes mellitus with diabetic neuropathy, unspecified: Secondary | ICD-10-CM | POA: Diagnosis not present

## 2020-04-06 DIAGNOSIS — E1140 Type 2 diabetes mellitus with diabetic neuropathy, unspecified: Secondary | ICD-10-CM | POA: Diagnosis not present

## 2020-04-07 LAB — HM DIABETES EYE EXAM

## 2020-04-08 DIAGNOSIS — E1140 Type 2 diabetes mellitus with diabetic neuropathy, unspecified: Secondary | ICD-10-CM | POA: Diagnosis not present

## 2020-04-09 ENCOUNTER — Ambulatory Visit: Payer: Medicaid Other | Admitting: Physical Therapy

## 2020-04-09 DIAGNOSIS — E1140 Type 2 diabetes mellitus with diabetic neuropathy, unspecified: Secondary | ICD-10-CM | POA: Diagnosis not present

## 2020-04-10 ENCOUNTER — Ambulatory Visit (INDEPENDENT_AMBULATORY_CARE_PROVIDER_SITE_OTHER): Payer: Medicaid Other | Admitting: Pharmacist

## 2020-04-10 ENCOUNTER — Other Ambulatory Visit: Payer: Self-pay

## 2020-04-10 ENCOUNTER — Encounter: Payer: Self-pay | Admitting: Pharmacist

## 2020-04-10 ENCOUNTER — Other Ambulatory Visit: Payer: Medicaid Other

## 2020-04-10 ENCOUNTER — Other Ambulatory Visit: Payer: Self-pay | Admitting: Family Medicine

## 2020-04-10 DIAGNOSIS — Z794 Long term (current) use of insulin: Secondary | ICD-10-CM | POA: Diagnosis not present

## 2020-04-10 DIAGNOSIS — E1165 Type 2 diabetes mellitus with hyperglycemia: Secondary | ICD-10-CM

## 2020-04-10 DIAGNOSIS — E1151 Type 2 diabetes mellitus with diabetic peripheral angiopathy without gangrene: Secondary | ICD-10-CM | POA: Diagnosis not present

## 2020-04-10 DIAGNOSIS — E1140 Type 2 diabetes mellitus with diabetic neuropathy, unspecified: Secondary | ICD-10-CM | POA: Diagnosis not present

## 2020-04-10 DIAGNOSIS — IMO0002 Reserved for concepts with insufficient information to code with codable children: Secondary | ICD-10-CM

## 2020-04-10 MED ORDER — DEXCOM G6 TRANSMITTER MISC: 1.0000 | 3 refills | Status: DC

## 2020-04-10 MED ORDER — DEXCOM G6 SENSOR MISC: 1.0000 | 11 refills | Status: DC

## 2020-04-10 MED ORDER — LIRAGLUTIDE 18 MG/3ML ~~LOC~~ SOPN: PEN_INJECTOR | SUBCUTANEOUS | 3 refills | Status: DC

## 2020-04-10 NOTE — Telephone Encounter (Signed)
Completed PA info in [place] for hydrocone-acetaminophen 10-325.  Status pending.  Will recheck status in 24 hours.  Pt also states that she cant get her pain meds until Saturday because the script says fill 30 days apart.  She wants to know if we can give the ok to have it filled on Friday, so it can be delivered?  The pharmacy does not deliver on Saturday. Will forward to PCP to get ok.  Once both are approved will call pharmacy back ( Speak with Latoya).    [name], CMA

## 2020-04-10 NOTE — Patient Instructions (Addendum)
It was great to see you today!   You blood glucose has improved and we will work to continue to improve it. Your Dexcom and Victoza (liraglutide) will be ordered and ready for pick up at [facility] on 04/11/2020.   Follow up plan - Pick up Dexcom at [facility] and bring supplies back at appointment next week to set up your Dexcom with [place]. - Start taking victoza (liraglutide) 0.6mg  subcutaneous injection once daily. This medication may cause some nausea, if this medication causes vomiting please contact us.  - Continue to focus on working on your diet.

## 2020-04-10 NOTE — Telephone Encounter (Signed)
Fine to fill Friday, please inform pharmacy [name], MD

## 2020-04-10 NOTE — Progress Notes (Signed)
Reviewed: Agree with Dr. Koval's documentation and management. 

## 2020-04-10 NOTE — Assessment & Plan Note (Signed)
Diabetes longstanding currently uncontrolled. Medication adherence reported is good. Control is suboptimal due to limited mobility and current diet though with improvements in diet recently.  -Prescriptions sent to [place] for continuous glucose monitoring with Dexcom G6 with plan for patient to pick up supplies and bring supplies next week for set up and education. -Continued humulin 70/30 (insulin isophane & regular insulin) 80 units in the morning and 40 units in the evening.  -Started GLP-1 Victoza (generic name liragutide) 0.6mg  subcutaneous injection daily. Patient was education on proper administration and counseled on nausea with instruction to call if vomiting occurs.  -Extensively discussed pathophysiology of diabetes, recommended lifestyle interventions, dietary effects on blood sugar control -Last A1C >15 on 04/03/2020. Next A1C anticipated 3 months

## 2020-04-10 NOTE — Progress Notes (Signed)
S:     Chief Complaint  Patient presents with  . Medication Management    Diabetes    Patient arrives in good spirits, well appearing, limited mobility due towheelchair. Visit conduct with Daughter, [name], who is primary care giver at age 42.  Presents for diabetes evaluation, education, and management.  Patient was referred and last seen by Primary Care Provider on 04/03/2020. Patient was last seen by Primary Care Provider, Dr. [name], on 04/03/2020  Insurance coverage/medication affordability: [place] Medicaid  Medication adherence reported is good to current regimen.  Current diabetes medications include: Humulin 70/30 80 units in the morning and 40 units in the evening,  Current hypertension medications include: amlodipine 10mg  daily Current hyperlipidemia medications include: atorvastatin 20mg  daily   Patient denies hypoglycemic events. Patient reports blood glucose ranges from 300-500s with recent improvement in averaging 300s.   Patient reported dietary habits: Eats 2 meals/day Breakfast: Eggs with bacon or sausage, and grits though recently stopped eating grits with breakfast. Lunch: Typically does not eat lunch as it depends on what time the patient wakes up and eats breakfast. Dinner: A source of protein and vegetable with a carbohydrate such as white pasta or rice.  Drinks: Water, smoothies made by daughter with breakfast, diet soda at dinner, and orange juice.   Patient-reported exercise habits: Limited due to limited mobility due to left amputation.    Patient reports neuropathy (nerve pain) in legs and restless sensation in legs.  Patient reports visual changes including blurry vision and feels is correlated to times of significant hyperglycemia.  Patient reports significant thirst with associated increased urination.   O:  Physical Exam Vitals reviewed.  Constitutional:      Appearance: She is obese.  Pulmonary:     Effort: Pulmonary effort is normal.    Neurological:     Mental Status: She is alert.  Psychiatric:        Mood and Affect: Mood normal.        Thought Content: Thought content normal.        Judgment: Judgment normal.      Review of Systems  Eyes: Positive for blurred vision.  Genitourinary: Positive for frequency.  All other systems reviewed and are negative.    Lab Results  Component Value Date   HGBA1C >15.0 04/03/2020   Vitals:   04/10/20 0938  BP: 118/78  Pulse: 90  SpO2: 97%    Lipid Panel     Component Value Date/Time   CHOL 343 (H) 09/19/2018 0928   TRIG 456 (H) 09/19/2018 0928   HDL 35 (L) 09/19/2018 0928   CHOLHDL 9.8 (H) 09/19/2018 0928   CHOLHDL 6.3 (H) 01/11/2017 0909   VLDL 52 (H) 01/11/2017 0909   LDLCALC Comment 09/19/2018 0928   LDLDIRECT 164.9 11/15/2014 1148    7 day average: 371 14 and 30 day average: 344  Lipid panel obtained today per Dr. [name].   A/P: Diabetes longstanding currently uncontrolled. Medication adherence reported is good. Control is suboptimal due to limited mobility and current diet though with improvements in diet recently.  -Prescriptions sent to [place] for continuous glucose monitoring with Dexcom G6 with plan for patient to pick up supplies and bring supplies next week for set up and education. -Continued humulin 70/30 (insulin isophane & regular insulin) 80 units in the morning and 40 units in the evening.  -Started GLP-1 Victoza (generic name liragutide) 0.6mg  subcutaneous injection daily. Patient was education on proper administration and counseled on nausea  with instruction to call if vomiting occurs.  -Extensively discussed pathophysiology of diabetes, recommended lifestyle interventions, dietary effects on blood sugar control -Last A1C >15 on 04/03/2020. Next A1C anticipated 3 months   ASCVD risk - secondary prevention in patient with diabetes.Lipid panel last obtained on 09/2018 with LDL unable to be calculated due to triglycerides >400.   -Obtained lipid panel today  -Continued atorvastatin 20 mg.   Hypertension longstanding currently controlled.  Blood pressure goal = <130/80 mmHg. Medication adherence reported is good. Blood pressure at visit was at goal.  -Continue amlodipine 10mg  daily   Written patient instructions provided.  Total time in face to face counseling 45 minutes.   Follow up with Pharmacist next week for continuous glucose monitoring with Dexcom initiation and possible titration of Victoza (liraglutide) next week. Follow up with PCP Dr. [name] as instructed. Patient seen with Sherre Poot, PharmD Candidate and Cristela Felt, PharmD PGY-1 Resident.

## 2020-04-10 NOTE — Telephone Encounter (Signed)
Med Approved  Effective Begin Date:04/10/2020 Effective End Date:10/07/2020   Pharmacy informed of approval and also ok given to fill on Friday.  LM informing patient.  Christen Bame, CMA

## 2020-04-11 ENCOUNTER — Telehealth: Payer: Self-pay

## 2020-04-11 ENCOUNTER — Telehealth: Payer: Self-pay | Admitting: Family Medicine

## 2020-04-11 LAB — LIPID PANEL
Chol/HDL Ratio: 7 ratio — ABNORMAL HIGH (ref 0.0–4.4)
Cholesterol, Total: 274 mg/dL — ABNORMAL HIGH (ref 100–199)
HDL: 39 mg/dL — ABNORMAL LOW (ref >39)
LDL Chol Calc (NIH): 173 mg/dL — ABNORMAL HIGH (ref 0–99)
Triglycerides: 324 mg/dL — ABNORMAL HIGH (ref 0–149)
VLDL Cholesterol Cal: 62 mg/dL — ABNORMAL HIGH (ref 5–40)

## 2020-04-11 MED ORDER — ATORVASTATIN CALCIUM 40 MG PO TABS: 40.0000 mg | ORAL_TABLET | Freq: Every day | ORAL | 3 refills | Status: DC

## 2020-04-11 NOTE — Telephone Encounter (Signed)
All PAs have been submitted for patient. Patient contacted and informed of this and wait time possibly into next week. Patient appreciative of call.

## 2020-04-11 NOTE — Telephone Encounter (Signed)
Received fax from [facility] requesting prior authorization of Dexcom G6 Public relations account executive. Form placed in [place] box for completion.

## 2020-04-11 NOTE — Telephone Encounter (Signed)
Patient calling to speak to a nurse about her PA for some of her medications. Patient states it doesn't have to do with her pain medication, but with some medications Koval sent in yesterday. Patient call back number is [phone].

## 2020-04-11 NOTE — Telephone Encounter (Signed)
Called patient about lipids, which are elevated. LDL 173. Increase atorvastatin to 40mg  daily New rx sent in Patient agreeable  [name], MD

## 2020-04-11 NOTE — Telephone Encounter (Signed)
Completed PA info in [place] for Victoza. Status pending. Will recheck status in 24 hours.

## 2020-04-12 ENCOUNTER — Other Ambulatory Visit: Payer: Self-pay | Admitting: Pharmacist

## 2020-04-12 DIAGNOSIS — Z794 Long term (current) use of insulin: Secondary | ICD-10-CM

## 2020-04-12 DIAGNOSIS — E1140 Type 2 diabetes mellitus with diabetic neuropathy, unspecified: Secondary | ICD-10-CM | POA: Diagnosis not present

## 2020-04-12 DIAGNOSIS — E1165 Type 2 diabetes mellitus with hyperglycemia: Secondary | ICD-10-CM

## 2020-04-13 DIAGNOSIS — E1140 Type 2 diabetes mellitus with diabetic neuropathy, unspecified: Secondary | ICD-10-CM | POA: Diagnosis not present

## 2020-04-14 ENCOUNTER — Other Ambulatory Visit: Payer: Self-pay

## 2020-04-14 ENCOUNTER — Encounter (HOSPITAL_COMMUNITY): Payer: Self-pay | Admitting: Emergency Medicine

## 2020-04-14 ENCOUNTER — Emergency Department (HOSPITAL_COMMUNITY): Payer: Medicaid Other

## 2020-04-14 ENCOUNTER — Emergency Department (HOSPITAL_COMMUNITY)
Admission: EM | Admit: 2020-04-14 | Discharge: 2020-04-14 | Disposition: A | Discharge status: Home / Self Care | Payer: Medicaid Other | Attending: Emergency Medicine | Admitting: Emergency Medicine

## 2020-04-14 DIAGNOSIS — R0602 Shortness of breath: Secondary | ICD-10-CM | POA: Insufficient documentation

## 2020-04-14 DIAGNOSIS — E1140 Type 2 diabetes mellitus with diabetic neuropathy, unspecified: Secondary | ICD-10-CM | POA: Diagnosis not present

## 2020-04-14 DIAGNOSIS — Z5321 Procedure and treatment not carried out due to patient leaving prior to being seen by health care provider: Secondary | ICD-10-CM | POA: Diagnosis not present

## 2020-04-14 DIAGNOSIS — I10 Essential (primary) hypertension: Secondary | ICD-10-CM | POA: Diagnosis not present

## 2020-04-14 LAB — CBG MONITORING, ED: Glucose-Capillary: 388 mg/dL — ABNORMAL HIGH (ref 70–99)

## 2020-04-14 NOTE — Telephone Encounter (Signed)
[employer] signed form. Form faxed off to [employer].

## 2020-04-14 NOTE — ED Notes (Signed)
Noticed no v/s were entered when pt was triaged - attempted to obtain v/s in lobby but no response from pt.  Triage RN and charge RN aware.

## 2020-04-14 NOTE — Telephone Encounter (Signed)
Prior approval for Victoza completed via [employer]. Med approved for 04/11/2020-04/06/2021. Prior approval [id number]. Pharmacy and patient updated.

## 2020-04-14 NOTE — ED Triage Notes (Addendum)
Per EMS pt complaint of SOB thinks its related to mold; "worse in my bedroom."

## 2020-04-14 NOTE — ED Notes (Signed)
Called 3X for room placement. Eloped from waiting room.

## 2020-04-14 NOTE — ED Notes (Signed)
Called a second time for pt w/ no response.

## 2020-04-15 ENCOUNTER — Ambulatory Visit (INDEPENDENT_AMBULATORY_CARE_PROVIDER_SITE_OTHER): Payer: Medicaid Other | Admitting: Pharmacist

## 2020-04-15 ENCOUNTER — Ambulatory Visit (INDEPENDENT_AMBULATORY_CARE_PROVIDER_SITE_OTHER): Payer: Medicaid Other | Admitting: Family Medicine

## 2020-04-15 ENCOUNTER — Other Ambulatory Visit (HOSPITAL_COMMUNITY): Payer: Medicaid Other

## 2020-04-15 ENCOUNTER — Encounter: Payer: Self-pay | Admitting: Pharmacist

## 2020-04-15 ENCOUNTER — Ambulatory Visit: Payer: Medicaid Other | Attending: Family Medicine | Admitting: Physical Therapy

## 2020-04-15 ENCOUNTER — Encounter: Payer: Self-pay | Admitting: Physical Therapy

## 2020-04-15 ENCOUNTER — Encounter: Payer: Self-pay | Admitting: Family Medicine

## 2020-04-15 VITALS — BP 112/60 | HR 95

## 2020-04-15 DIAGNOSIS — M25652 Stiffness of left hip, not elsewhere classified: Secondary | ICD-10-CM | POA: Insufficient documentation

## 2020-04-15 DIAGNOSIS — E1151 Type 2 diabetes mellitus with diabetic peripheral angiopathy without gangrene: Secondary | ICD-10-CM

## 2020-04-15 DIAGNOSIS — IMO0002 Reserved for concepts with insufficient information to code with codable children: Secondary | ICD-10-CM

## 2020-04-15 DIAGNOSIS — Z77120 Contact with and (suspected) exposure to mold (toxic): Secondary | ICD-10-CM | POA: Diagnosis not present

## 2020-04-15 DIAGNOSIS — Z794 Long term (current) use of insulin: Secondary | ICD-10-CM

## 2020-04-15 DIAGNOSIS — E1165 Type 2 diabetes mellitus with hyperglycemia: Secondary | ICD-10-CM | POA: Diagnosis not present

## 2020-04-15 DIAGNOSIS — S90829A Blister (nonthermal), unspecified foot, initial encounter: Secondary | ICD-10-CM

## 2020-04-15 DIAGNOSIS — M21371 Foot drop, right foot: Secondary | ICD-10-CM | POA: Diagnosis not present

## 2020-04-15 DIAGNOSIS — R2681 Unsteadiness on feet: Secondary | ICD-10-CM | POA: Diagnosis not present

## 2020-04-15 DIAGNOSIS — R531 Weakness: Secondary | ICD-10-CM

## 2020-04-15 DIAGNOSIS — M25661 Stiffness of right knee, not elsewhere classified: Secondary | ICD-10-CM | POA: Diagnosis not present

## 2020-04-15 DIAGNOSIS — M79604 Pain in right leg: Secondary | ICD-10-CM | POA: Diagnosis not present

## 2020-04-15 DIAGNOSIS — R293 Abnormal posture: Secondary | ICD-10-CM | POA: Insufficient documentation

## 2020-04-15 DIAGNOSIS — R2689 Other abnormalities of gait and mobility: Secondary | ICD-10-CM | POA: Diagnosis not present

## 2020-04-15 DIAGNOSIS — Z89421 Acquired absence of other right toe(s): Secondary | ICD-10-CM | POA: Diagnosis not present

## 2020-04-15 DIAGNOSIS — M25651 Stiffness of right hip, not elsewhere classified: Secondary | ICD-10-CM | POA: Insufficient documentation

## 2020-04-15 DIAGNOSIS — E1142 Type 2 diabetes mellitus with diabetic polyneuropathy: Secondary | ICD-10-CM

## 2020-04-15 MED ORDER — INSULIN ISOPHANE & REGULAR (HUMAN 70-30)100 UNIT/ML KWIKPEN: PEN_INJECTOR | SUBCUTANEOUS | Status: DC

## 2020-04-15 NOTE — Patient Instructions (Signed)
It was very nice to meet you today. Please enjoy the rest of your week.   Follow up Friday for a wound check  Please call the clinic at [phone] if your symptoms worsen or you have any concerns. It was our pleasure to serve you.

## 2020-04-15 NOTE — Progress Notes (Signed)
Reveiewed: Agree with Dr. Graylin Shiver documentation and management.

## 2020-04-15 NOTE — Assessment & Plan Note (Signed)
Diabetes longstanding currently uncontrolled. Patient is able to verbalize appropriate hypoglycemia management plan. Patient appears somewhat adherent with medication despite patient report of adherence. Control is suboptimal due to immobility, sedentary lifestyle, suboptimal diet. Dexcom G6 placed on patient's upper right stomach successfully. -Increased dose of basal insulin Humulin 70/30 (insulin NPH & regular) to 85 units into the skin with breakfast and 45 units daily with supper.   -Continued GLP-1 Victoza (generic name liraglutide) to 0.6 mg weekly with plans to increase dose in 2 weeks.  -Extensively discussed pathophysiology of diabetes, recommended lifestyle interventions, dietary effects on blood sugar control -Counseled on s/sx of and management of hypoglycemia

## 2020-04-15 NOTE — Progress Notes (Signed)
    SUBJECTIVE:   CHIEF COMPLAINT / HPI:   Foot blister Right medial foot blister that the patient noticed last Friday. She has had some blood like drainage from the site. It is painless. She lays on her left side at night sometimes with a pillow in between her legs but says her foot could be rubbing against her other leg. She has seen podiatry in the past and has a bad experience and would not like a referral at this time. Denies trauma, fever, erythema, and edema.  Exposure to mold States she lives in an apartment with suspected mold. Has nightly congestion when sleeping in the back room but not in the living room. Neighbors have complained of possible mold. Would like to see an allergist for these reason. Denies shortness of breath.   PERTINENT  PMH / PSH: Uncontrolled T2DM- visit this morning with Dr. Valentina Lucks, HTN, OSA, allergic rhinitis, diabetic neuropathy, abdominal abscess, S/P Left BKA, S/P Right metarsal amputation   OBJECTIVE:   BP 112/60   Pulse 95   LMP 04/09/2020   SpO2 97%       ASSESSMENT/PLAN:   Blister of foot Acute. Non-infectious appearing. Painless likely d/t patient peripheral neuropathy. Likely from pressure of laying on side at night and will likely resolve on its own. Will need to see podiatry in the near future.  -Friday wound check; if worse she needs to see wound care -Consider repeat podiatry referral  Exposure to mold -Referral to allergist   Gerlene Fee, [place]

## 2020-04-15 NOTE — Patient Instructions (Addendum)
It was a pleasure seeing you in clinic today!  Today the plan is to increase the Humulin (insulin NPH & regular) to 85 units in the morning and 45 units in the evening with supper.  Continue Victoza (liraglutide) 0.6 mg once a week.   Please call the PharmD clinic at [phone] if you have any questions that you would like to speak with a pharmacist about (Dr. Nicholas Lose or Dr. [name] Iha).   Please remember... 1. Sensor will last 10 days 2. Transmitter will last 90 days and must be reused 3. Sensor should be applied to area away from waistband, scarring, tattoos, irritation, and bones. 4. Transmitter must be within 20 feet of receiver/cell phone. 5. If using Dexcom G6 app on cell phone, please remember to keep app open (do not close out of app). 6. Do a fingerstick blood glucose test if the sensor readings do not match how    you feel 7. Remove sensor prior to magnetic resonance imaging (MRI), computed tomography (CT) scan, or high-frequency electrical heat (diathermy) treatment. 8. Do not allow sun screen or insect repellant to come into contact with Dexcom G6. These skin care products may lead for the plastic used in the Dexcom G6 to crack. 9. Dexcom G6 may be worn through a Environmental education officer. It may not be exposed to an advanced Imaging Technology (AIT) body scanner (also called a millimeter wave scanner) or the baggage x-ray machine. Instead, ask for hand-wanding or full-body pat-down and visual inspection.  10. Doses of acetaminophen (Tylenol) >1 gram every 6 hours may cause false high readings. 11. Hydroxyurea (Hydrea, Droxia) may interfere with accuracy of blood glucose readings from Dexcom G6. 12. Store sensor kit between 36 and 86 degrees Farenheit. Can be refrigerated within this temperature range.  Ordering Overlay Patches 1. Go to the following website every 30 days to order new overlay patches:  [email or web address]  Problems with  Dexcom sticking? 1. Order Skin Tac from [facility]. Alcohol swab area you plan to administer Dexcom then let dry. Once dry, apply Skin Tac in a circular motion (with a spot in the middle for sensor without skin tac) and let dry. Once dry you can apply Dexcom!   Problems taking off Dexcom? 1. Remember to try to shower before removing Dexcom 2. Order Tac Away to help remove any extra adhesive left on your skin once you remove [facility]  Dexcom Customer Service Information 1. Customer Sales Support (dexcom orders and general customer questions) Phone number: [phone] Monday - Friday  6 AM - 5 PM PST Saturday 8 AM - 4 PM PST  *Contact if you do not receive overlay patches  2. Global Technical Support (product troubleshooting or replacement inquiries) Phone number: [phone] Available 24 hours a day; 7 days a week  *Contact if you have a "bad" sensor. Remember to tell them you are wearing Dexcom on your stomach!  3. Dexcom Care (provides dexcom CGM training, software downloads, and tutorials) Phone number: [phone] Monday - Friday 6 AM - 5 PM PST Saturday 7 AM - 1:30 PM PST (All hours subject to change)  4. Website: [email or web address]

## 2020-04-15 NOTE — Progress Notes (Signed)
S:     Chief Complaint  Patient presents with  . Medication Management    CGM Set-up    Patient arrives to clinic in good spirits, well appearing, limited mobility due to wheelchair. Visit conducted with daughter, [name], 14, who is patient's sole caregiver. Presents for diabetes management and Dexcom G6 application.  Patient was referred and last seen by Primary Care Provider, Dr. [name], on 04/03/2020.    Insurance coverage/medication affordability: Medicaid  Patient reports adherence with medications.  Current diabetes medications include: Humulin 70/30 mix (insulin NPH & regular) 80 units w/breakfast and 45 units w/supper, Victoza (liraglutide) 0.6 mg once weekly Current hypertension medications include: amlodipine 10 mg daily Current hyperlipidemia medications include: atorvastatin 40 mg daily  Patient denies hypoglycemic events.   Dexcom G6 patient education Person(s)instructed: Patient  Instruction: Patient oriented to three components of Dexcom G6 continuous glucose monitor (sensor, transmitter, receiver/cellphone) Receiver or cellphone: cellphone -Dexcom G6 AND dexcom clarity app downloaded onto cellphone: yes -Patient educated that Dexom G6 app must always be running (patient should not close out of app) -If using Dexcom G6 app, patient may share blood glucose data with up to 10 followers on dexcom follow app. Dexcom G6 account email: [email or web address] Username: [email or web address] Dexcom G6 account password: [email or web address] Sensor code: [id number] Transmitter code: Health visitor code: [facility] Sensor Lot No: [id number] Transmitter Lot No: [id number]  CGM overview and set-up  1. Button, touch screen, and icons 2. Power supply and recharging 3. Home screen 4. Date and time 5. Set BG target range: 200-250 6. Set alarm/alert tone   -Urgent low (automatic): yes -Urgent Low Soon: yes -Low Glucose: 70 -High Glucose: 300 7. Interstitial vs. capillary blood  glucose readings  8. When to verify sensor reading with fingerstick blood glucose 9. Blood glucose reading measured every five minutes. 10. Sensor will last 10 days 11. Transmitter will last 90 days and must be reused  12. Transmitter must be within 20 feet of receiver/cell phone.  Sensor application -- sensor placed on right, upper belly 1. Site selection and site prep with alcohol pad 2. Sensor prep-sensor pack and sensor applicator 3. Sensor applied to area away from waistband, scarring, tattoos, irritation, and bones 4. Transmitter sanitized with alcohol pad and inserted into sensor. 5. Starting the sensor: 2 hour warm up before BG readings available 6. Sensor change every 10 days and rotate site 7. Call Dexcom customer service if sensor comes off before 10 days  Safety and Troubleshooting 1. Do a fingerstick blood glucose test if the sensor readings do not match how    you feel 2. Remove sensor prior to magnetic resonance imaging (MRI), computed tomography (CT) scan, or high-frequency electrical heat (diathermy) treatment. 3. Do not allow sun screen or insect repellant to come into contact with Dexcom G6. These skin care products may lead for the plastic used in the Dexcom G6 to crack. 4. Dexcom G6 may be worn through a Environmental education officer. It may not be exposed to an advanced Imaging Technology (AIT) body scanner (also called a millimeter wave scanner) or the baggage x-ray machine. Instead, ask for hand-wanding or full-body pat-down and visual inspection.  5. Doses of acetaminophen (Tylenol) >1 gram every 6 hours may cause false high readings. 6. Hydroxyurea (Hydrea, Droxia) may interfere with accuracy of blood glucose readings from Dexcom G6. 7. Store sensor kit between 36 and 86 degrees Farenheit. Can be refrigerated within this temperature range.  Contact information provided for [facility] customer service and/or  trainer.  O:  Physical Exam Constitutional:       Appearance: She is obese.  Pulmonary:     Effort: Pulmonary effort is normal.  Neurological:     Mental Status: She is alert.  Psychiatric:        Mood and Affect: Mood normal.        Thought Content: Thought content normal.        Judgment: Judgment normal.      Review of Systems  Respiratory:       Patient reports improved breathing from yesterday (04/14/2020) when seen in ER (patient believes breathing difficulty due to apartment mold)  All other systems reviewed and are negative.    Lab Results  Component Value Date   HGBA1C >15.0 04/03/2020   Vitals:   04/15/20 0842  Pulse: 98  SpO2: 95%    Lipid Panel     Component Value Date/Time   CHOL 274 (H) 04/10/2020 1200   TRIG 324 (H) 04/10/2020 1200   HDL 39 (L) 04/10/2020 1200   CHOLHDL 7.0 (H) 04/10/2020 1200   CHOLHDL 6.3 (H) 01/11/2017 0909   VLDL 52 (H) 01/11/2017 0909   LDLCALC 173 (H) 04/10/2020 1200   LDLDIRECT 164.9 11/15/2014 1148    Home fasting blood sugars: patient reports BG being in the 380's   A/P: Diabetes  Diabetes longstanding currently uncontrolled. Patient is able to verbalize appropriate hypoglycemia management plan. Patient appears somewhat adherent with medication despite patient report of adherence. Control is suboptimal due to immobility, sedentary lifestyle, suboptimal diet. Dexcom G6 placed on patient's upper right stomach successfully. -Increased dose of basal insulin Humulin 70/30 (insulin NPH & regular) to 85 units into the skin with breakfast and 45 units daily with supper.   -Continued GLP-1 Victoza (generic name liraglutide) to 0.6 mg weekly with plans to increase dose in 2 weeks.  -Extensively discussed pathophysiology of diabetes, recommended lifestyle interventions, dietary effects on blood sugar control -Counseled on s/sx of and management of hypoglycemia -Next A1C anticipated 3 months.   Written patient instructions provided.  Total time in face to face counseling 65 minutes.    Follow up Pharmacist phone call in a week. RX Clinic Visit in 2 weeks.   Patient seen with Sherre Poot, PharmD Candidate and Cristela Felt, PharmD PGY-1 Resident.

## 2020-04-16 ENCOUNTER — Encounter: Payer: Self-pay | Admitting: Physical Therapy

## 2020-04-16 DIAGNOSIS — E1140 Type 2 diabetes mellitus with diabetic neuropathy, unspecified: Secondary | ICD-10-CM | POA: Diagnosis not present

## 2020-04-16 DIAGNOSIS — H5213 Myopia, bilateral: Secondary | ICD-10-CM | POA: Diagnosis not present

## 2020-04-16 NOTE — Therapy (Addendum)
[facility] [address] [place], [place], [address] Phone: [phone]   Fax:  [phone]  Physical Therapy Treatment & Recertification  Patient Details  Name: [name] MRN: [id number] Date of Birth: 1976-02-16 Referring Provider (PT): [name], MD   Encounter Date: 04/15/2020  PT End of Session - 04/15/20 1502    Visit Number  12    Number of Visits  21    Authorization Type  Medicaid    Authorization Time Period  12 visits 01/23/2020 - 04/15/2020    Authorization - Visit Number  5    Authorization - Number of Visits  12    PT Start Time  1410    PT Stop Time  3013   left early due to medical issue prevented any standing today   PT Time Calculation (min)  35 min    Equipment Utilized During Treatment  Gait belt    Activity Tolerance  Patient tolerated treatment well;Patient limited by fatigue    Behavior During Therapy  [facility] for tasks assessed/performed       Past Medical History:  Diagnosis Date  . Acute osteomyelitis of ankle or foot, left ([place]) 01/31/2018  . Alveolar hypoventilation   . Anemia    not on iron pill  . Asthma   . Bipolar 2 disorder ([place])   . Carpal tunnel syndrome on right    recurrent  . Cellulitis 08/2010-08/2011  . Chronic pain   . COPD (chronic obstructive pulmonary disease) ([name])    Symbicort daily and Proventil as needed  . Costochondritis   . Depression   . Diabetes mellitus type II, uncontrolled ([place]) 2000   Type 2, Uncontrolled.Takes Lantus daily.Fasting blood sugar runs 150  . Dizziness    occasionally  . Drug-seeking behavior   . GERD (gastroesophageal reflux disease)    takes Pantoprazole and Zantac daily  . Headache    migraine-last one about a yr ago.Topamax daily  . HLD (hyperlipidemia)    takes Atorvastatin daily  . Hypertension    takes Lisinopril and Coreg daily  . Morbid obesity ([place])   . Muscle spasm    takes Flexeril as needed  . Nocturia   . OSA  on CPAP   . Peripheral neuropathy    takes Gabapentin daily  . Pneumonia    "walking" several yrs ago and as a baby (12/05/2018)  . Rectal fissure   . Restless leg   . Syncope 02/25/2016  . Urinary frequency   . Varicose veins    Right medial thigh and Left leg     Past Surgical History:  Procedure Laterality Date  . AMPUTATION Left 02/01/2018   Procedure: LEFT FOURTH AND 5TH TOE RAY AMPUTATION;  Surgeon: [name], MD;  Location: [place];  Service: Orthopedics;  Laterality: Left;  . AMPUTATION Left 03/03/2018   Procedure: LEFT BELOW KNEE AMPUTATION;  Surgeon: [name], MD;  Location: [place];  Service: Orthopedics;  Laterality: Left;  . CARPAL TUNNEL RELEASE Bilateral   . CESAREAN SECTION  2007  . INCISION AND DRAINAGE PERIRECTAL ABSCESS Left 05/18/2019   Procedure: IRRIGATION AND DEBRIDEMENT OF PANNIS ABSCESS, POSSIBLE DEBRIDEMENT OF BUTTOCK WOUND;  Surgeon: Donnie Mesa, MD;  Location: [facility];  Service: General;  Laterality: Left;  . IRRIGATION AND DEBRIDEMENT BUTTOCKS Left 05/17/2019   Procedure: IRRIGATION AND DEBRIDEMENT BUTTOCKS;  Surgeon: Donnie Mesa, MD;  Location: [facility];  Service: General;  Laterality: Left;  . KNEE ARTHROSCOPY Right 07/17/2010  .  LEFT HEART CATHETERIZATION WITH CORONARY ANGIOGRAM N/A 07/27/2012   Procedure: LEFT HEART CATHETERIZATION WITH CORONARY ANGIOGRAM;  Surgeon: Sherren Mocha, MD;  Location: [facility] CATH LAB;  Service: Cardiovascular;  Laterality: N/A;  . MASS EXCISION N/A 06/29/2013   Procedure:  WIDE LOCAL EXCISION OF POSTERIOR NECK ABSCESS;  Surgeon: Ralene Ok, MD;  Location: [facility];  Service: General;  Laterality: N/A;  . REPAIR KNEE LIGAMENT Left    "fixed ligaments and chipped patella"  . right transmetatarsal amputation       There were no vitals filed for this visit.  Subjective Assessment - 04/15/20 1500    Subjective  Has new blister on right medial aspect of foot. Just saw MD for wound, goes back on Friday for wound check.     Pertinent History  Rt Transmetatarsal Amputation, LTTA, OA, COPD, asthma, uncontrolled DM2, depression, Neuropathy, Bipolar, carpal tunnel, morbid obesity, syncope    Limitations  Lifting;Standing;Walking;House hold activities    Patient Stated Goals  To walk in house & community    Currently in Pain?  No/denies   has chronic pain, on medication   Pain Score  0-No pain            [facility] Adult PT Treatment/Exercise - 04/15/20 1508      Transfers   Transfers  --      Self-Care   Self-Care  Other Self-Care Comments    ADL's  pt reports limited standing at home at this time, however pain increase varies depending on how long she does stand.     Other Self-Care Comments   discussed use of pillows while lying in bed to off load pressure to right foot due to new wound. Pt stating she does not like to use pillows as "they are hot and get in the way". educated continued pressure on this wound can make it worse and need for off loading pressure.       Prosthetics   Current prosthetic wear tolerance (days/week)   daily    Current prosthetic wear tolerance (#hours/day)   She reports prosthesis wear most of time out of bed during day.      Residual limb condition   intact with no issues    Education Provided  Residual limb care;Care of non-amputated limb;Proper wear schedule/adjustment;Proper weight-bearing schedule/adjustment    Person(s) Educated  Patient;Child(ren)    Education Method  Explanation;Demonstration;Verbal cues    Education Method  Verbalized understanding;Returned demonstration;Verbal cues required;Needs further instruction               PT Short Term Goals - 04/02/20 1154      PT SHORT TERM GOAL #1   Title  Patient verbalizes consistant daily wear of prosthesis for 6 hours total / day. (All updated STGs target date 02/17/2020)    Baseline  MET 04/02/2020    Time  4    Period  Weeks    Status  Achieved    Target Date  03/19/20      PT SHORT TERM GOAL #2   Title   Patient tolerates 5 minutes of standing activities with UE support with vital signs WNL.    Baseline  NOT MET 04/02/2020 Patient only tolerates up to 3 minutes of standing before dyspnea & fatigue feeling limiting    Time  4    Period  Weeks    Status  Not Met    Target Date  03/19/20      PT SHORT TERM GOAL #3   Title  Patient able to reach to floor with RW support with supervision.    Time  4    Period  Weeks    Status  Not Met    Target Date  03/19/20      PT SHORT TERM GOAL #4   Title  Patient ambulates 43' with RW & prosthesis and GrAFO with supervision.    Time  4    Period  Weeks    Status  Not Met    Target Date  03/19/20      PT SHORT TERM GOAL #5   Title  Patient negotiates ramp & curb with RW, prosthesis & GrAFO with modA.    Time  4    Period  Weeks    Status  Not Met    Target Date  03/19/20        PT Long Term Goals - 04/15/20 1502      PT LONG TERM GOAL #1   Title  Patient verbalizes & demonstrates proper orthotic care and maintains current mod-I prosthetic care ability to enable safe use of orthosis & prosthesis. (All LTGs Target Date 04/11/2020)    Baseline  04/15/20: wearing 4-5 days a week, for 2 hours each day.    Status  Partially Met      PT LONG TERM GOAL #2   Title  Patient tolerates prosthetic liner wear for >90% and prosthetic wear >50% of awake hours without skin integrity issues for improved mobility.    Baseline  04/15/20: pt is wearing liner >/= 90% of awake hours (even in bed), wearing prosthesis ~80% of awak hours out of bed ("pops it off at times when sitting to watch TV)    Status  Achieved      PT LONG TERM GOAL #3   Title  Patient reports pain increases </= 3 increments with standing & gait increased activities noted in LTGs with RW, prosthesis, and orthosis.    Baseline  04/15/20: continues to vary depending on how long pt is standing, does report this has overall gotten better    Status  Partially Met      PT LONG TERM GOAL #4   Title   Standing balance with RW support reaching 10" anteriorly, picking up objects from floor & scanning environment safely modified independent.    Baseline  02/20/20: min guard assist with RW/prosthesis/AFO to static stand for 3 minutes, reach 2-3 inches from floor (improved from 12/05/19 baseline). 04/15/20: unable to reassess this date    Time  --    Period  --    Status  Unable to assess      PT LONG TERM GOAL #5   Title  Patient ambulates 85' with RW & prosthesis & orthosis modified independent to enable basic community entry.    Baseline  02/20/20: min guard to supervision for 37 feet with RW/prosthesis/AFO (improved from 12/04/20 baseline); 04/15/20 unable to reassess today    Time  --    Period  --    Status  Unable to assess      PT LONG TERM GOAL #6   Title  Patient negotiates ramp & curb with RW & prosthesis & orthosis minA from family to enable community access.    Baseline  04/02/20: mod assist with RW/prosthesis/AFO for 4 inch curb negotiation, ramp has not been inititated to date; 04/15/20: unable to assess today    Time  --    Period  --    Status  Unable to assess  Plan - 04/15/20 1502    Clinical Impression Statement  Today's skilled session focused on progress toward LTGs. Session was limited due to new wound on right foot limiting wear of shoe/orthotic and standing. Primary PT to complete recert. Pt on hold for 3 weeks to allow wound to heal.    Personal Factors and Comorbidities  Comorbidity 3+;Fitness;Past/Current Experience;Finances;Time since onset of injury/illness/exacerbation    Comorbidities  left Transtibial amputation, right Transmetatarsal amputation, OA, asthma, DM, depression, neuropathy, Bipolar    Examination-Activity Limitations  Bed Mobility;Caring for Others;Carry;Lift;Locomotion Level;Stairs;Stand;Transfers    Examination-Participation Restrictions  Community Activity;Driving;Meal Prep;Other   parenting 42yo dtr   Stability/Clinical Decision  Making  Evolving/Moderate complexity    Rehab Potential  Good    PT Frequency  1x / week   1x/wk for 3 weeks, then 2x/wk for 6 weeks   PT Duration  12 weeks    PT Treatment/Interventions  ADLs/Self Care Home Management;DME Instruction;Gait training;Stair training;Functional mobility training;Therapeutic activities;Therapeutic exercise;Balance training;Neuromuscular re-education;Patient/family education;Prosthetic Training;Orthotic Fit/Training;Vestibular    PT Next Visit Plan  work on standing tolerance / balance & gait with prosthesis & AFO.    Consulted and Agree with Plan of Care  Patient       Patient will benefit from skilled therapeutic intervention in order to improve the following deficits and impairments:  Abnormal gait, Decreased activity tolerance, Decreased balance, Decreased endurance, Decreased knowledge of use of DME, Decreased mobility, Decreased range of motion, Decreased strength, Dizziness, Impaired flexibility, Postural dysfunction, Prosthetic Dependency, Obesity, Pain  Visit Diagnosis: Unsteadiness on feet  Abnormal posture  Weakness generalized     Problem List Patient Active Problem List   Diagnosis Date Noted  . Constipation 04/03/2020  . Status post transmetatarsal amputation of right foot ([place]) 02/13/2020  . UTI (urinary tract infection) 06/28/2019  . Right hand pain 06/23/2019  . Amputation above knee ([place]) 06/23/2019  . Left shoulder pain 06/23/2019  . Dysuria 06/14/2019  . Abdominal pain   . Abscess of skin of abdomen   . Abscess of buttock, right 05/17/2019  . Severe protein-calorie malnutrition ([place])   . 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[place]) 04/11/2019  . Type 2 diabetes mellitus with diabetic polyneuropathy, with long-term current use of insulin ([place]) 04/11/2019  . Type 2 diabetes mellitus with hyperglycemia, with long-term current use of insulin ([place]) 04/11/2019  . Cellulitis of great toe of right foot 10/29/2018  . Uncontrolled type 2 diabetes  mellitus with insulin therapy ([place]) 04/16/2018  . Allergic rhinitis 03/06/2018  . Unilateral complete BKA, left, sequela ([place])   . Class 3 severe obesity due to excess calories with serious comorbidity and body mass index (BMI) of 50.0 to 59.9 in adult [facility])   . AKI (acute kidney injury) ([place])   . Decreased pedal pulses 06/10/2017  . Unilateral primary osteoarthritis, right knee 10/22/2016  . Primary osteoarthritis of first carpometacarpal joint of left hand 07/30/2016  . Diabetic neuropathy ([place]) 07/14/2016  . Nausea 03/17/2016  . De Quervain's tenosynovitis, bilateral 11/01/2015  . Vitamin D deficiency 09/05/2015  . Recurrent candidiasis of vagina 09/05/2015  . Varicose veins of leg with complications 95/32/0233  . Restless leg syndrome 10/17/2014  . Chronic sinusitis 07/18/2014  . Encounter for chronic pain management 06/30/2013  . HLD (hyperlipidemia) 11/19/2012  . Chest pain 06/27/2012  . Right carpal tunnel syndrome 09/01/2011  . Bilateral knee pain 09/01/2011  . DM (diabetes mellitus) type II uncontrolled, periph vascular disorder ([place]) 05/22/2008  . Morbid obesity ([place]) 05/22/2008  . OBESITY HYPOVENTILATION  SYNDROME 05/22/2008  . Depression 05/22/2008  . Obstructive sleep apnea 05/22/2008  . Hypertension 05/22/2008  . Asthma 05/22/2008  . GERD 05/22/2008    Willow Ora, PTA, [facility] [address], [place], [place] [address] [phone] 04/16/20, 10:36 AM   Name: Dailin Sosnowski MRN: [id number] Date of Birth: 1976-07-29  PT End of Session - 04/17/20 0832    Visit Number  12    Number of Visits  22    Authorization Type  Medicaid    Authorization Time Period  12 visits 01/23/2020 - 04/15/2020    Authorization - Visit Number  5    Authorization - Number of Visits  12    Equipment Utilized During Treatment  Gait belt    Activity Tolerance  Patient tolerated treatment well;Patient limited by fatigue    Behavior During Therapy  [facility] for  tasks assessed/performed      Plan - 04/17/20 0850    Clinical Impression Statement  Patient continues to have poor controlled diabetes that has impaired her ability to attend PT sessions. Her unstable DM & morbid obesity impacts her activity tolerance with deconditioning, weakness and standing / gait. She has high fall risk also. The more that she is inactive this downward cycle of health & activity limitations will continue. She is making slow progress with PT care and still has potential to increase her safety & function with skilled PT care / instruction.    Personal Factors and Comorbidities  Comorbidity 3+;Fitness;Past/Current Experience;Finances;Time since onset of injury/illness/exacerbation    Comorbidities  left Transtibial amputation, right Transmetatarsal amputation, OA, asthma, DM, depression, neuropathy, Bipolar    Examination-Activity Limitations  Bed Mobility;Caring for Others;Carry;Lift;Locomotion Level;Stairs;Stand;Transfers    Examination-Participation Restrictions  Community Activity;Driving;Meal Prep;Other   parenting 42yo dtr   Stability/Clinical Decision Making  Evolving/Moderate complexity    Rehab Potential  Good    PT Frequency  1x / week   1x/wk for 3 weeks, then 2x/wk for 6 weeks   PT Duration  Other (comment)   10 weeks   PT Treatment/Interventions  ADLs/Self Care Home Management;DME Instruction;Gait training;Stair training;Functional mobility training;Therapeutic activities;Therapeutic exercise;Balance training;Neuromuscular re-education;Patient/family education;Prosthetic Training;Orthotic Fit/Training;Vestibular    PT Next Visit Plan  work on standing tolerance / balance & gait with prosthesis & AFO.    Consulted and Agree with Plan of Care  Patient      PT Short Term Goals - 04/17/20 0840      PT SHORT TERM GOAL #1   Title  Patient tolerates wear of AFO >50% of awake hours without negative impact on skin integrity (negative impact on wound). (All STGs Target  Date: 06/05/2020)    Baseline  04/15/20: pt is wearing liner >/= 90% of awake hours (even in bed), wearing prosthesis ~80% of awak hours out of bed ("pops it off at times when sitting to watch TV). She recieved AFO mid April & has worn limited amount. 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 Patient reports wound is from pressure in bed not AFO.    Time  5    Period  Weeks    Status  New    Target Date  06/05/20      PT SHORT TERM GOAL #2   Title  Patient standing balance with RW support reaching 5" anteriorly & touching floor with supervision.    Baseline  02/20/20: min guard assist with RW/prosthesis/AFO to static stand for 3 minutes,  reach 2-3 inches from floor (improved from 12/05/19 baseline). 04/15/20: unable to reassess this date    Time  5    Period  Weeks    Status  Revised    Target Date  06/05/20      PT SHORT TERM GOAL #3   Title  Patient reports out of bed >8 hrs total / day.    Baseline  Patient reports spending majority of her day in bed which leads to further deconditioning & negative impact on standing /gait.    Time  5    Period  Weeks    Status  New    Target Date  06/05/20      PT SHORT TERM GOAL #4   Title  Patient ambulates 20' with RW & prosthesis and GrAFO with supervision.    Baseline  02/20/20: min guard to supervision for 37 feet with RW/prosthesis/AFO (improved from 12/04/20 baseline); On 04/02/2020 pt only tolerated 10' gait with minA.  04/15/20 unable to reassess today    Time  5    Period  Weeks    Status  Revised    Target Date  06/05/20      PT SHORT TERM GOAL #5   Title  Patient negotiates ramp & curb with RW, prosthesis & GrAFO with modA.    Baseline  04/02/20: mod assist with RW/prosthesis/AFO for 4 inch curb negotiation, ramp has not been inititated to date; 04/15/20: unable to assess today    Time  5    Period  Weeks    Status  On-going    Target Date  06/05/20      PT Long Term Goals -  04/17/20 0832      PT LONG TERM GOAL #1   Title  Patient verbalizes & demonstrates proper orthotic care and maintains current mod-I prosthetic care ability to enable safe use of orthosis & prosthesis. (All LTGs Target Date 07/11/2020)    Baseline  04/15/20: wearing 4-5 days a week, for 2 hours each day.  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    Time  10    Period  Weeks    Status  On-going    Target Date  07/11/20      PT LONG TERM GOAL #2   Title  Patient tolerates prosthetic liner & AFO wear for >90% and prosthetic wear >75% of awake hours without skin integrity issues for improved mobility.    Baseline  04/15/20: pt is wearing liner >/= 90% of awake hours (even in bed), wearing prosthesis ~80% of awak hours out of bed ("pops it off at times when sitting to watch TV).  She recieved AFO mid April & has worn limited amount. 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 Patient reports wound is from pressure in bed not AFO.    Time  10    Period  Weeks    Status  Revised    Target Date  07/11/20      PT LONG TERM GOAL #3   Title  Patient reports pain increases </= 3 increments with standing & gait increased activities noted in LTGs with RW, prosthesis, and orthosis.    Baseline  04/15/20: continues to vary depending on how long pt is standing, does report this has overall gotten better    Time  10    Period  Weeks  Status  On-going      PT LONG TERM GOAL #4   Title  Standing balance with RW support reaching 5" anteriorly, picking up objects from floor, scanning environment & managing pants for toileting safely modified independent.    Baseline  02/20/20: min guard assist with RW/prosthesis/AFO to static stand for 3 minutes, reach 2-3 inches from floor (improved from 12/05/19 baseline). 04/15/20: unable to reassess this date    Time  10    Period  Weeks     Status  Revised    Target Date  07/11/20      PT LONG TERM GOAL #5   Title  Patient ambulates 80' with RW & prosthesis & orthosis modified independent to enable basic community entry.    Baseline  02/20/20: min guard to supervision for 37 feet with RW/prosthesis/AFO (improved from 12/04/20 baseline); 04/15/20 unable to reassess today    Time  10    Period  Weeks    Status  On-going    Target Date  07/11/20      PT LONG TERM GOAL #6   Title  Patient negotiates ramp & curb with RW & prosthesis & orthosis minA from family to enable community access.    Baseline  04/02/20: mod assist with RW/prosthesis/AFO for 4 inch curb negotiation, ramp has not been inititated to date; 04/15/20: unable to assess today    Time  10    Period  Weeks    Status  On-going    Target Date  07/11/20         ICD-10-CM   PL          1.   Unsteadiness on feet  R26.81     2.   Abnormal posture  R29.3     3.   Weakness generalized  R53.1     4.   Other abnormalities of gait and mobility  R26.89     5.   Pain in right leg  M79.604     6.   Stiffness of right hip, not elsewhere classified  M25.651     7.   Foot drop, right  M21.371     8.   Stiffness of left hip, not elsewhere classified  M25.652     9.   Stiffness of right knee, not elsewhere classified  M25.661       Jamey Reas, PT, DPT PT Specializing in [place] 04/17/20 10:31 AM Phone:  [phone]  Fax:  [phone] [facility] [address] [place], [place] [address]

## 2020-04-17 ENCOUNTER — Encounter (HOSPITAL_BASED_OUTPATIENT_CLINIC_OR_DEPARTMENT_OTHER): Payer: Medicaid Other | Admitting: Internal Medicine

## 2020-04-17 ENCOUNTER — Other Ambulatory Visit: Payer: Self-pay

## 2020-04-17 DIAGNOSIS — IMO0002 Reserved for concepts with insufficient information to code with codable children: Secondary | ICD-10-CM

## 2020-04-17 DIAGNOSIS — E1140 Type 2 diabetes mellitus with diabetic neuropathy, unspecified: Secondary | ICD-10-CM | POA: Diagnosis not present

## 2020-04-17 MED ORDER — SURE COMFORT PEN NEEDLES 32G X 4 MM MISC: 12 refills | Status: DC

## 2020-04-17 MED ORDER — ACCU-CHEK SOFTCLIX LANCETS MISC: 12 refills | Status: DC

## 2020-04-17 NOTE — Telephone Encounter (Signed)
Received request for Sure Comfort pen needles. Rx to pharmacy.   Belinda Singh, MD  Family Medicine Teaching Service  -----------------------------------------------------------  Patient request Sure Comfort Pen NDL 32 x 5/32". Not on current med list.   .[name], [place]

## 2020-04-17 NOTE — Addendum Note (Signed)
Addended by: Isaias Cowman on: 04/17/2020 10:41 AM   Modules accepted: Orders

## 2020-04-18 ENCOUNTER — Ambulatory Visit (INDEPENDENT_AMBULATORY_CARE_PROVIDER_SITE_OTHER): Payer: Medicaid Other | Admitting: Family Medicine

## 2020-04-18 ENCOUNTER — Other Ambulatory Visit: Payer: Self-pay | Admitting: Pharmacist

## 2020-04-18 ENCOUNTER — Other Ambulatory Visit: Payer: Self-pay

## 2020-04-18 DIAGNOSIS — S90829A Blister (nonthermal), unspecified foot, initial encounter: Secondary | ICD-10-CM

## 2020-04-18 DIAGNOSIS — E1140 Type 2 diabetes mellitus with diabetic neuropathy, unspecified: Secondary | ICD-10-CM | POA: Diagnosis not present

## 2020-04-18 MED ORDER — ACCU-CHEK MULTICLIX LANCETS MISC: 1.0000 | 1 refills | Status: DC | PRN

## 2020-04-18 MED ORDER — ACCU-CHEK GUIDE VI STRP: ORAL_STRIP | 5 refills | Status: DC

## 2020-04-18 MED ORDER — CLINDAMYCIN HCL 300 MG PO CAPS: 300.0000 mg | ORAL_CAPSULE | Freq: Four times a day (QID) | ORAL | 0 refills | Status: AC

## 2020-04-18 NOTE — Patient Instructions (Signed)
Thank you for coming to see me today. It was a pleasure! Today we talked about:   For your wound, please keep the area clean and dry.  I did a slight drainage today in order to get some of the infection out.  We will start you on an antibiotic called clindamycin.  Please take this 4 times per day or every 6 hours for 10 days.  I have scheduled a follow-up with me on Thursday May 20 at 10:30 AM.  If your wound is much better at that time we may be able to stop the antibiotics.  If you develop any fevers, fatigue, worsening pain, redness or drainage from your flank and do not hesitate to be seen in the emergency room after hours or in our clinic during regular hours.  If you have any questions or concerns, please do not hesitate to call the office at [phone].  Take Care,   [place] Saylah Ketner, DO

## 2020-04-18 NOTE — Progress Notes (Signed)
Patient requested I join a patient care visit this AM.   She request I refill her lancet (multi-clix) and test strips.  I agreed but also shared that she should not need to check often as checking is only needed if she is "low" to make sure the CGM reading is accurate and not "lag".   New Rxs sent to pharmacy .   Her CGM appears to be working for > 24 hours.  Readings are 200 and 300s.   Reassess in several days to consider additional insulin dose adjustment.

## 2020-04-18 NOTE — Progress Notes (Signed)
   SUBJECTIVE:   CHIEF COMPLAINT / HPI:   Blister of foot, R: Patient here for follow-up for right medial foot blister over previous transmetatarsal amputation.  Patient with history of poorly controlled diabetes.  Patient denies any drainage from the site.  She states it is remained painless.  She denies any fevers, chills.  She states that she does not believe it has gotten bigger since she left the office on 04/15/2020.  She was not told to do any different care of the area other than washing and keeping her foot elevated.  She reports that she has previously seen podiatry but would not like to follow-up with them given that last time she had a terrible experience that caused her to lose part of her foot.  She denies any trauma to the area.  PERTINENT  PMH / PSH: Uncontrolled T2DM- visit this morning with Dr. Valentina Lucks, HTN, OSA, allergic rhinitis, diabetic neuropathy, abdominal abscess, S/P Left BKA, S/P Right metarsal amputation   OBJECTIVE:  BP 122/78   Pulse (!) 102   Ht 5\' 2"  (1.575 m)   Wt (!) 309 lb (140.2 kg)   LMP 04/09/2020   SpO2 97%   BMI 56.52 kg/m   General: NAD, pleasant, in wheelchair Neck: Supple Cardiovascular:  no LE edema Respiratory: normal work of breathing Right foot: s/p transmetatarsal amputation with white blistering located over medial aspect.  No surrounding erythema.  No thickened callus noted. Psych: AOx3, appropriate affect  ASSESSMENT/PLAN:   Blister of foot Blister present on right foot at previous transmetatarsal amputation site.  Patient is high risk with poorly controlled diabetes.  Otherwise no signs of infection of area with no surrounding redness, purulent drainage and no fevers.  Did drain blister with mostly clear discharge.  No purulence noted.  No area to debride as it is without thickened callus at this time.  -We will prophylactically start patient on clindamycin 300 mg 4 times daily given high risk for infection -Patient scheduled follow-up  on 5/20 -Instructed to keep the area clean and dry and elevated.    Martin[place] Donne Baley, DO PGY-3, [facility]

## 2020-04-19 DIAGNOSIS — E1140 Type 2 diabetes mellitus with diabetic neuropathy, unspecified: Secondary | ICD-10-CM | POA: Diagnosis not present

## 2020-04-20 DIAGNOSIS — E1140 Type 2 diabetes mellitus with diabetic neuropathy, unspecified: Secondary | ICD-10-CM | POA: Diagnosis not present

## 2020-04-21 DIAGNOSIS — Z77120 Contact with and (suspected) exposure to mold (toxic): Secondary | ICD-10-CM

## 2020-04-21 DIAGNOSIS — S90829A Blister (nonthermal), unspecified foot, initial encounter: Secondary | ICD-10-CM | POA: Insufficient documentation

## 2020-04-21 DIAGNOSIS — E1140 Type 2 diabetes mellitus with diabetic neuropathy, unspecified: Secondary | ICD-10-CM | POA: Diagnosis not present

## 2020-04-21 HISTORY — DX: Contact with and (suspected) exposure to mold (toxic): Z77.120

## 2020-04-21 NOTE — Assessment & Plan Note (Signed)
-  Referral to allergist

## 2020-04-21 NOTE — Assessment & Plan Note (Signed)
Acute. Non-infectious appearing. Painless likely d/t patient peripheral neuropathy. Likely from pressure of laying on side at night and will likely resolve on its own. Will need to see podiatry in the near future.  -Friday wound check; if worse she needs to see wound care -Consider repeat podiatry referral

## 2020-04-22 DIAGNOSIS — E1140 Type 2 diabetes mellitus with diabetic neuropathy, unspecified: Secondary | ICD-10-CM | POA: Diagnosis not present

## 2020-04-23 ENCOUNTER — Other Ambulatory Visit (HOSPITAL_COMMUNITY)
Admission: RE | Admit: 2020-04-23 | Discharge: 2020-04-23 | Disposition: A | Discharge status: Home / Self Care | Payer: Medicaid Other | Source: Ambulatory Visit | Attending: Internal Medicine | Admitting: Internal Medicine

## 2020-04-23 DIAGNOSIS — Z01812 Encounter for preprocedural laboratory examination: Secondary | ICD-10-CM | POA: Diagnosis not present

## 2020-04-23 DIAGNOSIS — Z20822 Contact with and (suspected) exposure to covid-19: Secondary | ICD-10-CM | POA: Diagnosis not present

## 2020-04-23 NOTE — Assessment & Plan Note (Signed)
Blister present on right foot at previous transmetatarsal amputation site.  Patient is high risk with poorly controlled diabetes.  Otherwise no signs of infection of area with no surrounding redness, purulent drainage and no fevers.  Did drain blister with mostly clear discharge.  No purulence noted.  No area to debride as it is without thickened callus at this time.  -We will prophylactically start patient on clindamycin 300 mg 4 times daily given high risk for infection -Patient scheduled follow-up on 5/20 -Instructed to keep the area clean and dry and elevated.

## 2020-04-24 ENCOUNTER — Ambulatory Visit: Payer: Medicaid Other | Admitting: Family Medicine

## 2020-04-24 DIAGNOSIS — E1140 Type 2 diabetes mellitus with diabetic neuropathy, unspecified: Secondary | ICD-10-CM | POA: Diagnosis not present

## 2020-04-24 LAB — SARS CORONAVIRUS 2 (TAT 6-24 HRS): SARS Coronavirus 2: NEGATIVE

## 2020-04-25 ENCOUNTER — Ambulatory Visit (HOSPITAL_BASED_OUTPATIENT_CLINIC_OR_DEPARTMENT_OTHER): Payer: Medicaid Other | Attending: Family Medicine | Admitting: Internal Medicine

## 2020-04-25 ENCOUNTER — Other Ambulatory Visit: Payer: Self-pay

## 2020-04-25 VITALS — Temp 97.1°F | Ht 62.0 in | Wt 334.0 lb

## 2020-04-25 DIAGNOSIS — G4733 Obstructive sleep apnea (adult) (pediatric): Secondary | ICD-10-CM | POA: Insufficient documentation

## 2020-04-25 DIAGNOSIS — E1140 Type 2 diabetes mellitus with diabetic neuropathy, unspecified: Secondary | ICD-10-CM | POA: Diagnosis not present

## 2020-04-26 DIAGNOSIS — E1140 Type 2 diabetes mellitus with diabetic neuropathy, unspecified: Secondary | ICD-10-CM | POA: Diagnosis not present

## 2020-04-27 DIAGNOSIS — E1140 Type 2 diabetes mellitus with diabetic neuropathy, unspecified: Secondary | ICD-10-CM | POA: Diagnosis not present

## 2020-04-28 ENCOUNTER — Telehealth: Payer: Self-pay

## 2020-04-28 DIAGNOSIS — E1140 Type 2 diabetes mellitus with diabetic neuropathy, unspecified: Secondary | ICD-10-CM | POA: Diagnosis not present

## 2020-04-28 NOTE — Telephone Encounter (Signed)
Patient returning Katy's call  

## 2020-04-28 NOTE — Telephone Encounter (Signed)
Scheduled the patient 6/4 with Dr. Burt Knack. She was grateful for assistance.

## 2020-04-28 NOTE — Telephone Encounter (Signed)
The patient's mother requests the patient is called to schedule a follow-up appointment with Dr. Burt Knack for evaluation of CP that has occurred for 2 weeks.  Left message for patient to call back to arrange appointment.

## 2020-04-30 DIAGNOSIS — E1140 Type 2 diabetes mellitus with diabetic neuropathy, unspecified: Secondary | ICD-10-CM | POA: Diagnosis not present

## 2020-05-01 DIAGNOSIS — E1140 Type 2 diabetes mellitus with diabetic neuropathy, unspecified: Secondary | ICD-10-CM | POA: Diagnosis not present

## 2020-05-02 DIAGNOSIS — E1140 Type 2 diabetes mellitus with diabetic neuropathy, unspecified: Secondary | ICD-10-CM | POA: Diagnosis not present

## 2020-05-03 DIAGNOSIS — E1140 Type 2 diabetes mellitus with diabetic neuropathy, unspecified: Secondary | ICD-10-CM | POA: Diagnosis not present

## 2020-05-04 DIAGNOSIS — E1140 Type 2 diabetes mellitus with diabetic neuropathy, unspecified: Secondary | ICD-10-CM | POA: Diagnosis not present

## 2020-05-05 IMAGING — DX PORTABLE CHEST - 1 VIEW
1 series · 1 of 1 positions shown · non-contrast
Comparison: 11/11/2018 and earlier.

CLINICAL DATA: 43-year-old female with shortness of breath.

EXAM:
PORTABLE CHEST 1 VIEW

[chest ap]
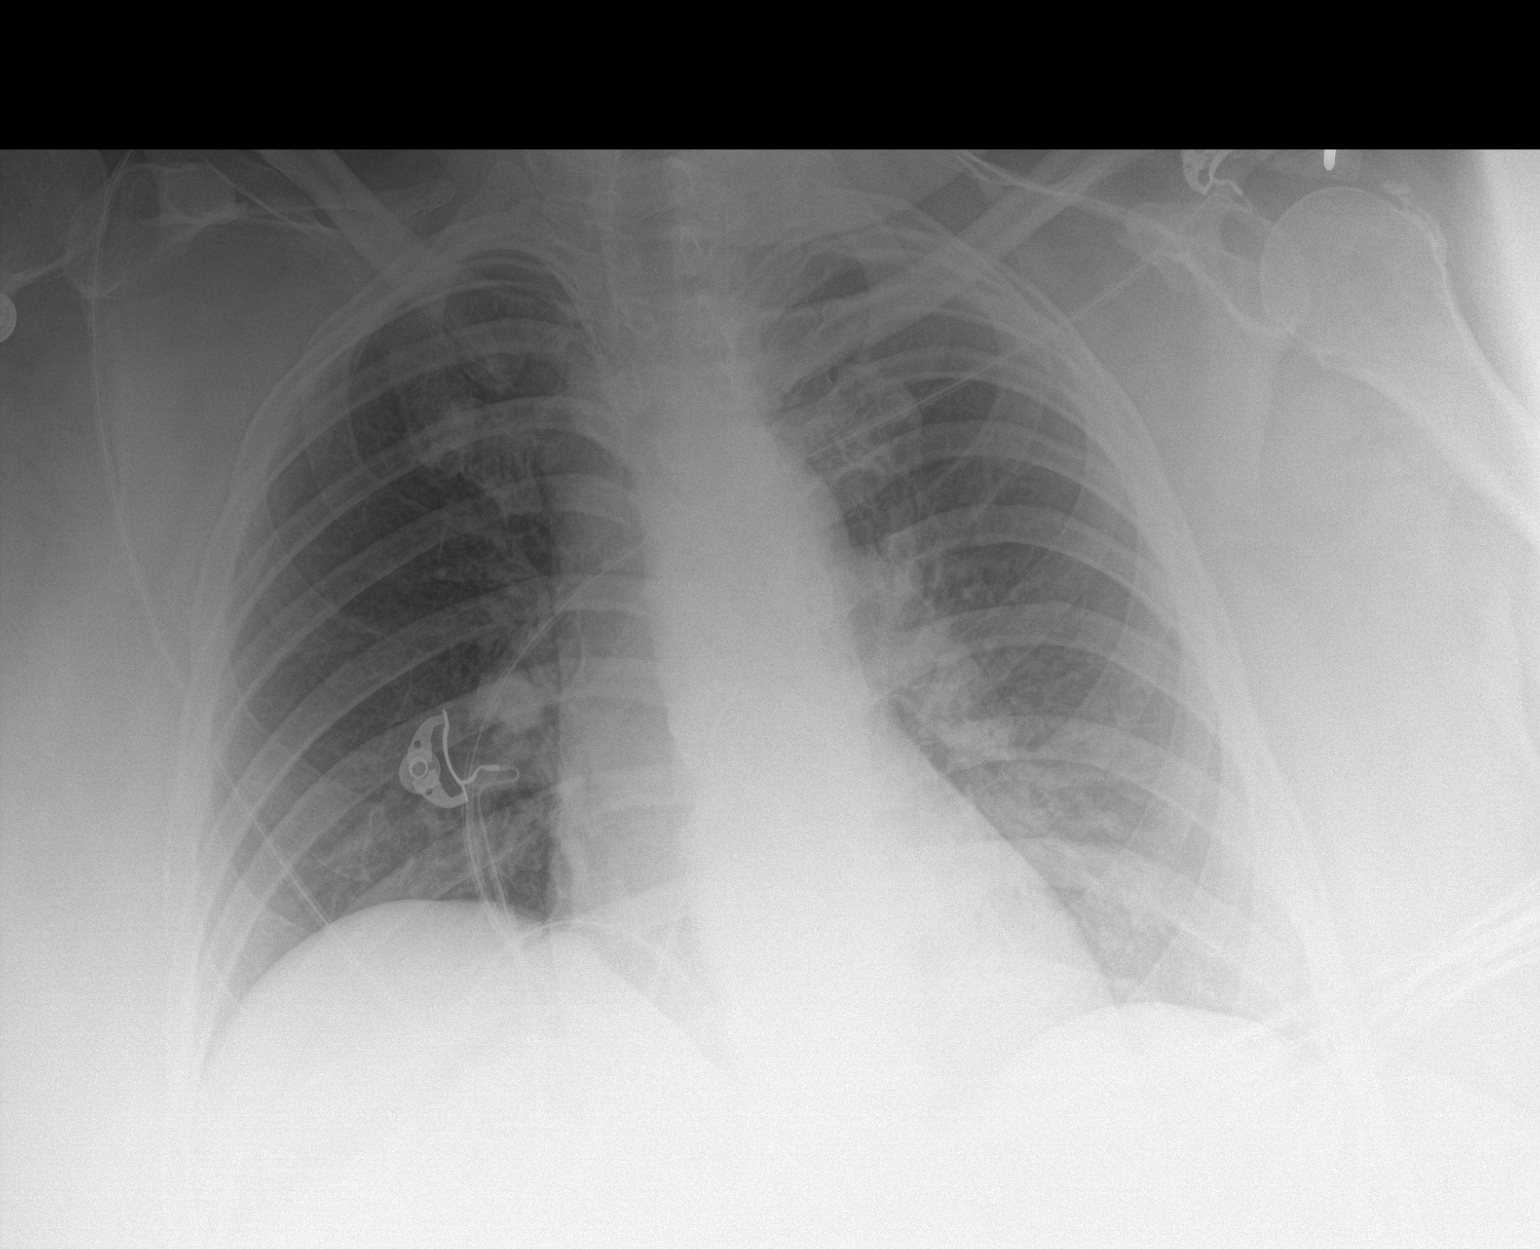

[1 of 1 positions shown; findings below may reference images not displayed]

FINDINGS: Portable AP semi upright view at 2984 hours. Mildly rotated to the
left. Left PICC line removed since [REDACTED]. Lung volumes and
mediastinal contours are within normal limits. Allowing for portable
technique the lungs are clear. Visualized tracheal air column is
within normal limits. No pneumothorax or pleural effusion. No acute
osseous abnormality identified.
IMPRESSION: Negative portable chest.

## 2020-05-06 ENCOUNTER — Other Ambulatory Visit: Payer: Self-pay

## 2020-05-06 DIAGNOSIS — E1140 Type 2 diabetes mellitus with diabetic neuropathy, unspecified: Secondary | ICD-10-CM | POA: Diagnosis not present

## 2020-05-06 NOTE — Telephone Encounter (Signed)
Patient calls nurse line requesting her pain medication to be approved for Friday, 6/4. Patient states the original due date is for Sunday, 6/6. However, [facility] is closed on Sundays. If sent in on Saturday, 6/5, they will not be able to fulfill the delivery date until Monday, 6/7. Please advise.

## 2020-05-07 ENCOUNTER — Telehealth: Payer: Self-pay | Admitting: Cardiovascular Disease

## 2020-05-07 ENCOUNTER — Ambulatory Visit: Payer: Medicaid Other | Attending: Family Medicine | Admitting: Physical Therapy

## 2020-05-07 ENCOUNTER — Other Ambulatory Visit: Payer: Self-pay

## 2020-05-07 ENCOUNTER — Encounter: Payer: Self-pay | Admitting: Physical Therapy

## 2020-05-07 DIAGNOSIS — M25661 Stiffness of right knee, not elsewhere classified: Secondary | ICD-10-CM | POA: Insufficient documentation

## 2020-05-07 DIAGNOSIS — M25652 Stiffness of left hip, not elsewhere classified: Secondary | ICD-10-CM | POA: Diagnosis not present

## 2020-05-07 DIAGNOSIS — M21371 Foot drop, right foot: Secondary | ICD-10-CM | POA: Diagnosis not present

## 2020-05-07 DIAGNOSIS — R531 Weakness: Secondary | ICD-10-CM

## 2020-05-07 DIAGNOSIS — Z9181 History of falling: Secondary | ICD-10-CM | POA: Insufficient documentation

## 2020-05-07 DIAGNOSIS — R293 Abnormal posture: Secondary | ICD-10-CM | POA: Diagnosis not present

## 2020-05-07 DIAGNOSIS — R2689 Other abnormalities of gait and mobility: Secondary | ICD-10-CM | POA: Diagnosis not present

## 2020-05-07 DIAGNOSIS — M79604 Pain in right leg: Secondary | ICD-10-CM | POA: Diagnosis not present

## 2020-05-07 DIAGNOSIS — R2681 Unsteadiness on feet: Secondary | ICD-10-CM | POA: Diagnosis not present

## 2020-05-07 DIAGNOSIS — M25651 Stiffness of right hip, not elsewhere classified: Secondary | ICD-10-CM | POA: Insufficient documentation

## 2020-05-07 NOTE — Patient Instructions (Signed)
Stand with w/c behind you & walker in front of you 1. Try to stand without touching as long as possible. Do 3-5 reps. 2. Let go of walker turn head right & left then touch walker. Do 5-10 reps. 3. Hold walker with one hand & hold other hand out in front of you. Lean forward and reach as far as possible. Switch hands. Do 5-10 reps.  Stand with back side to counter close but not touching & walker in front of you. 4.let go of walker look up, down then straight ahead. Touch walker. Do 5-10 reps.

## 2020-05-07 NOTE — Procedures (Signed)
    Patient Name: [name], [name] Date: 04/25/2020 Gender: Female D.O.B: 1976-08-21 Age (years): 44 Referring Provider: [name] Height (inches): 49 Interpreting Physician: Baird Lyons MD, ABSM Weight (lbs): 334 RPSGT: Zadie Rhine BMI: 61 MRN: [id number] Neck Size: 19.00  CLINICAL INFORMATION Sleep Study Type: NPSG Indication for sleep study: Obesity, OSA, Snoring Epworth Sleepiness Score: 3  Most recent polysomnogram dated 12/07/2013 revealed an AHI of 46/h. Most recent titration study was optimal at 13cm H2O with an AHI of 0/h.  SLEEP STUDY TECHNIQUE As per the AASM Manual for the Scoring of Sleep and Associated Events v2.3 (April 2016) with a hypopnea requiring 4% desaturations.  The channels recorded and monitored were frontal, central and occipital EEG, electrooculogram (EOG), submentalis EMG (chin), nasal and oral airflow, thoracic and abdominal wall motion, anterior tibialis EMG, snore microphone, electrocardiogram, and pulse oximetry.  MEDICATIONS Medications self-administered by patient taken the night of the study : NEURONTIN, [place] The study was initiated at 9:57:03 PM and ended at 4:21:23 AM.  Sleep onset time was 65.5 minutes and the sleep efficiency was 78.1%%. The total sleep time was 300 minutes.  Stage REM latency was 225.5 minutes.  The patient spent 2.2%% of the night in stage N1 sleep, 93.8%% in stage N2 sleep, 3.8%% in stage N3 and 0.2% in REM.  Alpha intrusion was absent.  Supine sleep was 0.00%.  RESPIRATORY PARAMETERS The overall apnea/hypopnea index (AHI) was 16.8 per hour. There were 0 total apneas, including 0 obstructive, 0 central and 0 mixed apneas. There were 84 hypopneas and 0 RERAs.  The AHI during Stage REM sleep was 0.0 per hour.  AHI while supine was N/A per hour.  The mean oxygen saturation was 90.5%. The minimum SpO2 during sleep was 81.0%.  snoring was noted during this study.  CARDIAC  DATA The 2 lead EKG demonstrated sinus rhythm. The mean heart rate was 103.7 beats per minute. Other EKG findings include: Ventricular Tachycardia.  LEG MOVEMENT DATA The total PLMS were 0 with a resulting PLMS index of 0.0. Associated arousal with leg movement index was 0.0 .  IMPRESSIONS - Moderate obstructive sleep apnea occurred during this study (AHI = 16.8/h). - No significant central sleep apnea occurred during this study (CAI = 0.0/h). - Oxygen desaturation was noted during this study (Min O2 = 81.0%). Mean sat 90.5%. - Infrequent snoring was audible during this study. - EKG findings include Ventricular Tachycardia- unconfirmed. - Clinically significant periodic limb movements did not occur during sleep. No significant associated arousals.  DIAGNOSIS - Obstructive Sleep Apnea (327.23 [G47.33 ICD-10])  RECOMMENDATIONS - Suggest CPAP titration sleep study or autopap. Other options would be based on clinical judgment. - Be careful with alcohol, sedatives and other CNS depressants that may worsen sleep apnea and disrupt normal sleep architecture. - Sleep hygiene should be reviewed to assess factors that may improve sleep quality. - Weight management and regular exercise should be initiated or continued if appropriate.  [Electronically signed] 05/07/2020 03:04 PM  Baird Lyons MD, [place], American Board of Sleep Medicine   NPI: [id number]                         [place], [place] [facility]  ELECTRONICALLY SIGNED ON:  05/07/2020, 2:59 PM [place] PH: [phone]   FX: [phone] [place]

## 2020-05-07 NOTE — Therapy (Signed)
[facility] [address] [place], [place], [address] Phone: [phone]   Fax:  [phone]  Physical Therapy Treatment  Patient Details  Name: [name] MRN: [id number] Date of Birth: 14-May-1976 Referring Provider (PT): [name], MD   Encounter Date: 05/07/2020  PT End of Session - 05/07/20 [id number]    Visit Number  13    Number of Visits  21    Authorization Type  Medicaid    Authorization Time Period  10 visits 05/07/2020 - 07/15/2020    Authorization - Visit Number  1    Authorization - Number of Visits  10    PT Start Time  1100    PT Stop Time  1148    PT Time Calculation (min)  48 min    Equipment Utilized During Treatment  Gait belt    Activity Tolerance  Patient tolerated treatment well;Patient limited by fatigue    Behavior During Therapy  WFL for tasks assessed/performed       Past Medical History:  Diagnosis Date  . Acute osteomyelitis of ankle or foot, left ([place]) 01/31/2018  . Alveolar hypoventilation   . Anemia    not on iron pill  . Asthma   . Bipolar 2 disorder ([place])   . Carpal tunnel syndrome on right    recurrent  . Cellulitis 08/2010-08/2011  . Chronic pain   . COPD (chronic obstructive pulmonary disease) ([name])    Symbicort daily and Proventil as needed  . Costochondritis   . Depression   . Diabetes mellitus type II, uncontrolled ([place]) 2000   Type 2, Uncontrolled.Takes Lantus daily.Fasting blood sugar runs 150  . Dizziness    occasionally  . Drug-seeking behavior   . GERD (gastroesophageal reflux disease)    takes Pantoprazole and Zantac daily  . Headache    migraine-last one about a yr ago.Topamax daily  . HLD (hyperlipidemia)    takes Atorvastatin daily  . Hypertension    takes Lisinopril and Coreg daily  . Morbid obesity ([place])   . Muscle spasm    takes Flexeril as needed  . Nocturia   . OSA on CPAP   . Peripheral neuropathy    takes Gabapentin daily  . Pneumonia    "walking" several yrs ago and as a baby (12/05/2018)  . Rectal fissure   . Restless leg   . Syncope 02/25/2016  . Urinary frequency   . Varicose veins    Right medial thigh and Left leg     Past Surgical History:  Procedure Laterality Date  . AMPUTATION Left 02/01/2018   Procedure: LEFT FOURTH AND 5TH TOE RAY AMPUTATION;  Surgeon: [name], MD;  Location: [facility];  Service: Orthopedics;  Laterality: Left;  . AMPUTATION Left 03/03/2018   Procedure: LEFT BELOW KNEE AMPUTATION;  Surgeon: [name], MD;  Location: [facility];  Service: Orthopedics;  Laterality: Left;  . CARPAL TUNNEL RELEASE Bilateral   . CESAREAN SECTION  2007  . INCISION AND DRAINAGE PERIRECTAL ABSCESS Left 05/18/2019   Procedure: IRRIGATION AND DEBRIDEMENT OF PANNIS ABSCESS, POSSIBLE DEBRIDEMENT OF BUTTOCK WOUND;  Surgeon: Donnie Mesa, MD;  Location: [facility];  Service: General;  Laterality: Left;  . IRRIGATION AND DEBRIDEMENT BUTTOCKS Left 05/17/2019   Procedure: IRRIGATION AND DEBRIDEMENT BUTTOCKS;  Surgeon: Donnie Mesa, MD;  Location: [facility];  Service: General;  Laterality: Left;  . KNEE ARTHROSCOPY Right 07/17/2010  . LEFT HEART CATHETERIZATION WITH CORONARY ANGIOGRAM N/A 07/27/2012   Procedure: LEFT HEART  CATHETERIZATION WITH CORONARY ANGIOGRAM;  Surgeon: Sherren Mocha, MD;  Location[facility] C[facility];  Service: Cardiovascular;  Laterality: N/A;  . MASS EXCISION N/A 06/29/2013   Procedure:  WIDE LOCAL EXCISION OF POSTERIOR NECK ABSCESS;  Surgeon: Ralene Ok, MD;  Location: [facility];  Service: General;  Laterality: N/A;  . REPAIR KNEE LIGAMENT Left    "fixed ligaments and chipped patella"  . right transmetatarsal amputation       There were no vitals filed for this visit.  Subjective Assessment - 05/07/20 1100    Subjective  the right partial foot has blister that doctor popped and some drainage.  She has been wearing compression stockings thigh high. She is out of bed 4-8 hrs 1x /day. Left prosthesis is on limb. She  has not been wearing AFO due to blister. She walks barefoot in house.    Pertinent History  Rt Transmetatarsal Amputation, LTTA, OA, COPD, asthma, uncontrolled DM2, depression, Neuropathy, Bipolar, carpal tunnel, morbid obesity, syncope    Limitations  Lifting;Standing;Walking;House hold activities    Patient Stated Goals  To walk in house & community    Currently in Pain?  No/denies                        [facility] Adult PT Treatment/Exercise - 05/07/20 1100      Transfers   Transfers  Sit to Stand;Stand to Sit    Sit to Stand  5: Supervision;With upper extremity assist;From chair/3-in-1   to RW   Stand to Sit  5: Supervision;With upper extremity assist;To chair/3-in-1   from RW   Stand Pivot Transfers  5: Supervision   RW, prosthesis & AFO     Ambulation/Gait   Ambulation/Gait  Yes    Ambulation/Gait Assistance  4: Min guard    Ambulation/Gait Assistance Details  verbal cues on upright posture & wt shift    Ambulation Distance (Feet)  20 Feet    Assistive device  Rolling walker;Prosthesis;Other (Comment)   right AFO   Gait Pattern  Step-to pattern;Decreased stride length;Decreased dorsiflexion - right;Decreased weight shift to left;Right foot flat;Antalgic;Lateral hip instability;Trunk flexed;Decreased step length - right;Decreased stance time - left;Right flexed knee in stance;Left flexed knee in stance    Ambulation Surface  Level;Indoor      Self-Care   Self-Care  --    ADL's  --    Other Self-Care Comments   --      Neuro Re-ed    Neuro Re-ed Details   standing with RW support intermittently:  static without UE support 5 attempts up to 19 seconds,  head turns right/left then touch RW 5 reps, single UE support reaching other UE forward 3 reps ea UE,  standing with posterior pelvis close to counter but not touching & RW anterior: head movement up/down then touch RW 5 reps.       Prosthetics   Prosthetic Care Comments   2cm blister on distal medial Transmetatarsal  Amputation - foot positioned on AFO with no touch or pressure on AFO.  PT demo & verbal cues on how shoe without AFO or barefoot puts pressure on wound area.  PT recommended having AFO on right limb wihen out of bed. If she wants to be comfortable when just sitting, loosen AFO strap.  PT recommended current goal is out of bed 8hrs /day even if 4hrs 2x/day.      Current prosthetic wear tolerance (days/week)   daily    Current prosthetic wear tolerance (#  hours/day)   prosthesis wear 4-8 hours - when sitting she pops prosthesis from liner but keeps liner on limb.     Residual limb condition   intact with no issues on left Transtibial Amputation. Right Transmetatarsal Amputation with 2cm blister on distal medial with redness around perimeter. No drainage today but reports daughter pushed pus out last night.      Education Provided  Residual limb care;Care of non-amputated limb;Proper wear schedule/adjustment;Proper weight-bearing schedule/adjustment;Other (comment)   see prosthetic care comments   Person(s) Educated  Patient;Parent(s)    Education Method  Explanation;Demonstration;Tactile cues;Verbal cues    Education Method  Verbalized understanding;Verbal cues required;Needs further instruction             PT Education - 05/07/20 1145    Education Details  HEP updated with balance activities - see pt instruction    Person(s) Educated  Patient    Methods  Explanation;Demonstration;Tactile cues;Verbal cues;Handout    Comprehension  Verbalized understanding;Returned demonstration;Verbal cues required;Tactile cues required;Need further instruction       PT Short Term Goals - 05/07/20 2206      PT SHORT TERM GOAL #1   Title  Patient tolerates wear of AFO >50% of awake hours without negative impact on skin integrity (negative impact on wound). (All STGs Target Date: 06/05/2020)    Baseline  04/15/20: pt is wearing liner >/= 90% of awake hours (even in bed), wearing prosthesis ~80% of awak hours  out of bed ("pops it off at times when sitting to watch TV). She recieved AFO mid April & has worn limited amount. 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 Patient reports wound is from pressure in bed not AFO.    Time  5    Period  Weeks    Status  New    Target Date  06/05/20      PT SHORT TERM GOAL #2   Title  Patient standing balance with RW support reaching 5" anteriorly & touching floor with supervision.    Baseline  02/20/20: min guard assist with RW/prosthesis/AFO to static stand for 3 minutes, reach 2-3 inches from floor (improved from 12/05/19 baseline). 04/15/20: unable to reassess this date    Time  5    Period  Weeks    Status  Revised    Target Date  06/05/20      PT SHORT TERM GOAL #3   Title  Patient reports out of bed >8 hrs total / day.    Baseline  Patient reports spending majority of her day in bed which leads to further deconditioning & negative impact on standing /gait.    Time  5    Period  Weeks    Status  New    Target Date  06/05/20      PT SHORT TERM GOAL #4   Title  Patient ambulates 8' with RW & prosthesis and GrAFO with supervision.    Baseline  02/20/20: min guard to supervision for 37 feet with RW/prosthesis/AFO (improved from 12/04/20 baseline); On 04/02/2020 pt only tolerated 10' gait with minA.  04/15/20 unable to reassess today    Time  5    Period  Weeks    Status  Revised    Target Date  06/05/20      PT SHORT TERM GOAL #5   Title  Patient negotiates ramp & curb with RW, prosthesis & GrAFO with modA.    Baseline  04/02/20:  mod assist with RW/prosthesis/AFO for 4 inch curb negotiation, ramp has not been inititated to date; 04/15/20: unable to assess today    Time  5    Period  Weeks    Status  On-going    Target Date  06/05/20        PT Long Term Goals - 04/17/20 0832      PT LONG TERM GOAL #1   Title  Patient verbalizes & demonstrates proper orthotic care and maintains current mod-I  prosthetic care ability to enable safe use of orthosis & prosthesis. (All LTGs Target Date 07/11/2020)    Baseline  04/15/20: wearing 4-5 days a week, for 2 hours each day.  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    Time  10    Period  Weeks    Status  On-going    Target Date  07/11/20      PT LONG TERM GOAL #2   Title  Patient tolerates prosthetic liner & AFO wear for >90% and prosthetic wear >75% of awake hours without skin integrity issues for improved mobility.    Baseline  04/15/20: pt is wearing liner >/= 90% of awake hours (even in bed), wearing prosthesis ~80% of awak hours out of bed ("pops it off at times when sitting to watch TV).  She recieved AFO mid April & has worn limited amount. 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 Patient reports wound is from pressure in bed not AFO.    Time  10    Period  Weeks    Status  Revised    Target Date  07/11/20      PT LONG TERM GOAL #3   Title  Patient reports pain increases </= 3 increments with standing & gait increased activities noted in LTGs with RW, prosthesis, and orthosis.    Baseline  04/15/20: continues to vary depending on how long pt is standing, does report this has overall gotten better    Time  10    Period  Weeks    Status  On-going      PT LONG TERM GOAL #4   Title  Standing balance with RW support reaching 5" anteriorly, picking up objects from floor, scanning environment & managing pants for toileting safely modified independent.    Baseline  02/20/20: min guard assist with RW/prosthesis/AFO to static stand for 3 minutes, reach 2-3 inches from floor (improved from 12/05/19 baseline). 04/15/20: unable to reassess this date    Time  10    Period  Weeks    Status  Revised    Target Date  07/11/20      PT LONG TERM GOAL #5   Title  Patient ambulates 62' with RW & prosthesis & orthosis  modified independent to enable basic community entry.    Baseline  02/20/20: min guard to supervision for 37 feet with RW/prosthesis/AFO (improved from 12/04/20 baseline); 04/15/20 unable to reassess today    Time  10    Period  Weeks    Status  On-going    Target Date  07/11/20      PT LONG TERM GOAL #6   Title  Patient negotiates ramp & curb with RW & prosthesis & orthosis minA from family to enable community access.    Baseline  04/02/20: mod assist with RW/prosthesis/AFO for 4 inch curb negotiation, ramp has not  been inititated to date; 04/15/20: unable to assess today    Time  10    Period  Weeks    Status  On-going    Target Date  07/11/20            Plan - 05/07/20 1226    Clinical Impression Statement  PT instructed patient with demo in how AFO can protect distal foot from pressure as shoe only causes shoe to fold to put pressure directly onto wound area. PT also instructed in positioning sidelying in bed to decrease pressure on area also. PT instructed in basic standing balance HEP & she appears to understand.    Personal Factors and Comorbidities  Comorbidity 3+;Fitness;Past/Current Experience;Finances;Time since onset of injury/illness/exacerbation    Comorbidities  left Transtibial amputation, right Transmetatarsal amputation, OA, asthma, DM, depression, neuropathy, Bipolar    Examination-Activity Limitations  Bed Mobility;Caring for Others;Carry;Lift;Locomotion Level;Stairs;Stand;Transfers    Examination-Participation Restrictions  Community Activity;Driving;Meal Prep;Other   parenting 42yo dtr   Stability/Clinical Decision Making  Evolving/Moderate complexity    Rehab Potential  Good    PT Frequency  1x / week   1x/wk for 3 weeks, then 2x/wk for 6 weeks   PT Duration  Other (comment)   10 weeks   PT Treatment/Interventions  ADLs/Self Care Home Management;DME Instruction;Gait training;Stair training;Functional mobility training;Therapeutic activities;Therapeutic  exercise;Balance training;Neuromuscular re-education;Patient/family education;Prosthetic Training;Orthotic Fit/Training;Vestibular    PT Next Visit Plan  work on standing tolerance / balance & gait with prosthesis & AFO towards STGs.    Consulted and Agree with Plan of Care  Patient       Patient will benefit from skilled therapeutic intervention in order to improve the following deficits and impairments:  Abnormal gait, Decreased activity tolerance, Decreased balance, Decreased endurance, Decreased knowledge of use of DME, Decreased mobility, Decreased range of motion, Decreased strength, Dizziness, Impaired flexibility, Postural dysfunction, Prosthetic Dependency, Obesity, Pain  Visit Diagnosis: Unsteadiness on feet  Other abnormalities of gait and mobility  Abnormal posture  Weakness generalized  Pain in right leg  Stiffness of right hip, not elsewhere classified  Foot drop, right     Problem List Patient Active Problem List   Diagnosis Date Noted  . Blister of foot 04/21/2020  . Exposure to mold 04/21/2020  . Constipation 04/03/2020  . Status post amputation of toe of right foot ([place]) 02/13/2020  . UTI (urinary tract infection) 06/28/2019  . Amputation above knee ([place]) 06/23/2019  . Left shoulder pain 06/23/2019  . Abscess of buttock, right 05/17/2019  . Severe protein-calorie malnutrition ([place])   . 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[place]) 04/11/2019  . Type 2 diabetes mellitus with diabetic polyneuropathy, with long-term current use of insulin ([place]) 04/11/2019  . Uncontrolled type 2 diabetes mellitus with insulin therapy ([place]) 04/16/2018  . Allergic rhinitis 03/06/2018  . Unilateral complete BKA, left, sequela ([place])   . Class 3 severe obesity due to excess calories with serious comorbidity and body mass index (BMI) of 50.0 to 59.9 in adult [facility])   . AKI (acute kidney injury) ([place])   . Decreased pedal pulses 06/10/2017  . Unilateral primary osteoarthritis, right knee 10/22/2016   . Primary osteoarthritis of first carpometacarpal joint of left hand 07/30/2016  . Diabetic neuropathy ([place]) 07/14/2016  . Nausea 03/17/2016  . De Quervain's tenosynovitis, bilateral 11/01/2015  . Vitamin D deficiency 09/05/2015  . Recurrent candidiasis of vagina 09/05/2015  . Varicose veins of leg with complications [id number]  . Restless leg syndrome 10/17/2014  . Chronic sinusitis 07/18/2014  .  Encounter for chronic pain management 06/30/2013  . HLD (hyperlipidemia) 11/19/2012  . Chest pain 06/27/2012  . Right carpal tunnel syndrome 09/01/2011  . Bilateral knee pain 09/01/2011  . DM (diabetes mellitus) type II uncontrolled, periph vascular disorder ([place]) 05/22/2008  . Morbid obesity ([place]) 05/22/2008  . 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 Depression 05/22/2008  . Obstructive sleep apnea 05/22/2008  . Hypertension 05/22/2008  . Asthma 05/22/2008  . GERD 05/22/2008    Jamey Reas PT, DPT 05/07/2020, 10:08 PM  [facility] [address] [place], [place], [address] Phone: [phone]   Fax:  [phone]  Name: [name] MRN: [id number] Date of Birth: 24-Dec-1975

## 2020-05-07 NOTE — Telephone Encounter (Signed)
Patient calling to request her daughter [name] come with her to her appointment 05/09/2020, because she is in a wheelchair.

## 2020-05-07 NOTE — Telephone Encounter (Signed)
Informed patient she may have 1 guest to accompany her during her appointment.

## 2020-05-08 ENCOUNTER — Ambulatory Visit: Payer: Medicaid Other

## 2020-05-09 ENCOUNTER — Other Ambulatory Visit: Payer: Self-pay

## 2020-05-09 ENCOUNTER — Ambulatory Visit (INDEPENDENT_AMBULATORY_CARE_PROVIDER_SITE_OTHER): Payer: Medicaid Other | Admitting: Cardiovascular Disease

## 2020-05-09 ENCOUNTER — Encounter (HOSPITAL_COMMUNITY): Payer: Self-pay

## 2020-05-09 ENCOUNTER — Ambulatory Visit (HOSPITAL_COMMUNITY): Payer: Medicaid Other | Attending: Cardiology

## 2020-05-09 ENCOUNTER — Encounter: Payer: Self-pay | Admitting: *Deleted

## 2020-05-09 ENCOUNTER — Encounter: Payer: Self-pay | Admitting: Cardiovascular Disease

## 2020-05-09 ENCOUNTER — Encounter (HOSPITAL_COMMUNITY): Payer: Self-pay | Admitting: *Deleted

## 2020-05-09 ENCOUNTER — Other Ambulatory Visit: Payer: Self-pay | Admitting: Family Medicine

## 2020-05-09 VITALS — BP 126/78 | HR 93 | Ht 62.0 in | Wt 334.0 lb

## 2020-05-09 DIAGNOSIS — R0789 Other chest pain: Secondary | ICD-10-CM | POA: Insufficient documentation

## 2020-05-09 DIAGNOSIS — E782 Mixed hyperlipidemia: Secondary | ICD-10-CM | POA: Diagnosis not present

## 2020-05-09 DIAGNOSIS — I10 Essential (primary) hypertension: Secondary | ICD-10-CM | POA: Diagnosis not present

## 2020-05-09 DIAGNOSIS — Z7689 Persons encountering health services in other specified circumstances: Secondary | ICD-10-CM | POA: Diagnosis not present

## 2020-05-09 DIAGNOSIS — R110 Nausea: Secondary | ICD-10-CM

## 2020-05-09 MED ORDER — HYDROCODONE-ACETAMINOPHEN 10-325 MG PO TABS: ORAL_TABLET | ORAL | 0 refills | Status: DC

## 2020-05-09 MED ORDER — TECHNETIUM TC 99M TETROFOSMIN IV KIT
29.6000 | PACK | Freq: Once | INTRAVENOUS | Status: AC | PRN
  Administered 2020-05-09: 29.6 via INTRAVENOUS
  Filled 2020-05-09: qty 30

## 2020-05-09 NOTE — Progress Notes (Signed)
Cardiology Office Note:    Date:  05/09/2020   ID:  [name], DOB 08-23-1976, MRN [id number]  PCP:  Belinda Rio, MD  [place] Cardiologist:  No primary care provider on file.  [facility] Electrophysiologist:  None   Referring MD: Belinda Rio, MD   Chief Complaint  Patient presents with  . Shortness of Breath  . Chest Pain    History of Present Illness:    [name] is a 44 y.o. female with a hx of normal coronary arteries by cardiac catheterization in 2013, morbid obesity, and uncontrolled diabetes with complications.  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  These occurred in the setting of diabetic foot ulcers.  She is here today because she is experiencing frequent chest discomfort.  She describes a sometimes sharp and sometimes pressure-like pain in the chest that comes and goes without clear precipitating events.  There is no associated shortness of breath. She has low functional capacity with obesity and will bilateral amputations.  She does have some shortness of breath with activity.  No orthopnea or PND.  She states that her glycemic control is slowly improving.  The chest discomfort she experiences is occurring in the center of her chest and sometimes on the left side.  There is no radiation into the neck or shoulder.  There are no clear precipitating factors.  Past Medical History:  Diagnosis Date  . Acute osteomyelitis of ankle or foot, left ([place]) 01/31/2018  . Alveolar hypoventilation   . Anemia    not on iron pill  . Asthma   . Bipolar 2 disorder ([place])   . Carpal tunnel syndrome on right    recurrent  . Cellulitis 08/2010-08/2011  . Chronic pain   . COPD (chronic obstructive pulmonary disease) ([name])    Symbicort daily and Proventil as needed  . Costochondritis   . Depression   . Diabetes mellitus type II, uncontrolled ([place]) 2000   Type 2, Uncontrolled.Takes Lantus daily.Fasting blood  sugar runs 150  . Dizziness    occasionally  . Drug-seeking behavior   . GERD (gastroesophageal reflux disease)    takes Pantoprazole and Zantac daily  . Headache    migraine-last one about a yr ago.Topamax daily  . HLD (hyperlipidemia)    takes Atorvastatin daily  . Hypertension    takes Lisinopril and Coreg daily  . Morbid obesity ([place])   . Muscle spasm    takes Flexeril as needed  . Nocturia   . OSA on CPAP   . Peripheral neuropathy    takes Gabapentin daily  . Pneumonia    "walking" several yrs ago and as a baby (12/05/2018)  . Rectal fissure   . Restless leg   . Syncope 02/25/2016  . Urinary frequency   . Varicose veins    Right medial thigh and Left leg     Past Surgical History:  Procedure Laterality Date  . AMPUTATION Left 02/01/2018   Procedure: LEFT FOURTH AND 5TH TOE RAY AMPUTATION;  Surgeon: [name], MD;  Location: [place];  Service: Orthopedics;  Laterality: Left;  . AMPUTATION Left 03/03/2018   Procedure: LEFT BELOW KNEE AMPUTATION;  Surgeon: [name], MD;  Location: [place];  Service: Orthopedics;  Laterality: Left;  . CARPAL TUNNEL RELEASE Bilateral   . CESAREAN SECTION  2007  . INCISION AND DRAINAGE PERIRECTAL ABSCESS Left 05/18/2019   Procedure: IRRIGATION AND DEBRIDEMENT OF PANNIS ABSCESS, POSSIBLE DEBRIDEMENT OF  BUTTOCK WOUND;  Surgeon: Donnie Mesa, MD;  Location: [place];  Service: General;  Laterality: Left;  . IRRIGATION AND DEBRIDEMENT BUTTOCKS Left 05/17/2019   Procedure: IRRIGATION AND DEBRIDEMENT BUTTOCKS;  Surgeon: Donnie Mesa, MD;  Location: [facility];  Service: General;  Laterality: Left;  . KNEE ARTHROSCOPY Right 07/17/2010  . LEFT HEART CATHETERIZATION WITH CORONARY ANGIOGRAM N/A 07/27/2012   Procedure: LEFT HEART CATHETERIZATION WITH CORONARY ANGIOGRAM;  Surgeon: Sherren Mocha, MD;  Location[facility] C[facility];  Service: Cardiovascular;  Laterality: N/A;  . MASS EXCISION N/A 06/29/2013   Procedure:  WIDE LOCAL EXCISION OF POSTERIOR NECK  ABSCESS;  Surgeon: Ralene Ok, MD;  Location: [place];  Service: General;  Laterality: N/A;  . REPAIR KNEE LIGAMENT Left    "fixed ligaments and chipped patella"  . right transmetatarsal amputation       Current Medications: Current Meds  Medication Sig  . Accu-Chek Softclix Lancets lancets Use to check blood glucose four times daily  . amLODipine (NORVASC) 10 MG tablet Take 1 tablet (10 mg total) by mouth daily.  Marland Kitchen atorvastatin (LIPITOR) 40 MG tablet Take 1 tablet (40 mg total) by mouth daily.  . budesonide-formoterol (SYMBICORT) 160-4.5 MCG/ACT inhaler inhale 2 PUFFS into THE lungs 2 TIMES DAILY (Patient taking differently: Inhale 2 puffs into the lungs 2 (two) times daily. )  . cetirizine (ZYRTEC) 10 MG tablet Take 10 mg by mouth daily as needed for allergies.  . Cholecalciferol 1000 units tablet Take 1 tablet (1,000 Units total) by mouth daily.  . Continuous Blood Gluc Sensor (DEXCOM G6 SENSOR) MISC Inject 1 applicator into the skin as directed. Change sensor every 10 days.  . Continuous Blood Gluc Transmit (DEXCOM G6 TRANSMITTER) MISC Inject 1 Device into the skin as directed. Reuse 8 times with sensor changes.  . cyclobenzaprine (FLEXERIL) 5 MG tablet TAKE 1 TABLET BY MOUTH 3 TIMES DAILY AS NEEDED FOR MUSCLE SPASMS  . gabapentin (NEURONTIN) 600 MG tablet TAKE 2 TABLETS BY MOUTH 3 TIMES DAILY  . glucose blood (ACCU-CHEK GUIDE) test strip USE T0 CHECK BLOOD SUGAR 3 TIMES DAILY  . HYDROcodone-acetaminophen (NORCO) 10-325 MG tablet Take one tablet every 6 to 8 hours as needed.  . insulin isophane & regular human (HUMULIN 70/30 MIX) (70-30) 100 UNIT/ML KwikPen Inject 85 Units into the skin daily with breakfast AND 45 Units daily with supper.  . Insulin Pen Needle (SURE COMFORT PEN NEEDLES) 32G X 4 MM MISC Use to inject insulin as indicated  . Lancets (ACCU-CHEK MULTICLIX) lancets 1 each by Other route as needed for other. Use as instructed  . ondansetron (ZOFRAN ODT) 4 MG disintegrating  tablet Take 1 tablet (4 mg total) by mouth every 8 (eight) hours as needed for nausea or vomiting.  . pantoprazole (PROTONIX) 40 MG tablet Take 1 tablet (40 mg total) by mouth daily.  Marland Kitchen PROVENTIL HFA 108 (90 Base) MCG/ACT inhaler Inhale 1-2 puffs into the lungs every 6 (six) hours as needed for wheezing or shortness of breath. (Patient taking differently: Inhale 2 puffs into the lungs every 4 (four) hours as needed for wheezing. )  . rOPINIRole (REQUIP) 1 MG tablet Take 1 tablet (1 mg total) by mouth at bedtime.  Marland Kitchen terconazole (TERAZOL 7) 0.4 % vaginal cream Place 1 applicator vaginally at bedtime.  Marland Kitchen VICTOZA 18 MG/3ML SOPN inject 0.6 MG SUBCUTANEOUSLY ONCE A DAY FOR 1 WEEK, THEN INCREASE TO 1.2 MG ONCE daily max 1.8 MG     Allergies:   Cefepime, Kiwi extract,  Morphine and related, Trental [pentoxifylline], and Nubain [nalbuphine hcl]   Social History   Socioeconomic History  . Marital status: Married    Spouse name: Not on file  . Number of children: Not on file  . Years of education: Not on file  . Highest education level: Not on file  Occupational History  . Not on file  Tobacco Use  . Smoking status: Former Smoker    Packs/day: 0.30    Years: 0.30    Pack years: 0.09    Types: Cigarettes    Quit date: 12/06/1993    Years since quitting: 26.4  . Smokeless tobacco: Never Used  Substance and Sexual Activity  . Alcohol use: No    Alcohol/week: 0.0 standard drinks  . Drug use: Yes    Comment: OD attempts on home meds    . Sexual activity: Yes    Partners: Male  Other Topics Concern  . Not on file  Social History Narrative   Lives in [place] with her fiance and 42 yr old dtr.   Social Determinants of Health   Financial Resource Strain:   . Difficulty of Paying Living Expenses:   Food Insecurity:   . Worried About Charity fundraiser in the Last Year:   . Arboriculturist in the Last Year:   Transportation Needs:   . Film/video editor (Medical):   Marland Kitchen Lack of Transportation  (Non-Medical):   Physical Activity:   . Days of Exercise per Week:   . Minutes of Exercise per Session:   Stress:   . Feeling of Stress :   Social Connections:   . Frequency of Communication with Friends and Family:   . Frequency of Social Gatherings with Friends and Family:   . Attends Religious Services:   . Active Member of Clubs or Organizations:   . Attends Archivist Meetings:   Marland Kitchen Marital Status:      Family History: The patient's family history includes Allergic rhinitis in her mother; Anxiety disorder in her sister; Cancer in her maternal grandmother; Depression in her mother; Diabetes in her maternal grandmother and mother; GER disease in her mother; Heart attack in her father, paternal grandfather, paternal grandmother, and paternal uncle; Heart disease in her paternal grandfather and paternal grandmother; Hyperlipidemia in her maternal grandmother and mother; Hypertension in her maternal grandmother; Migraines in her sister; Other in her maternal grandfather; Restless legs syndrome in her mother.  ROS:   Please see the history of present illness.    All other systems reviewed and are negative.  EKGs/Labs/Other Studies Reviewed:    The following studies were reviewed today: Echo 09-01-2017: Study Conclusions   - Left ventricle: The cavity size was normal. Systolic function was  normal. The estimated ejection fraction was in the range of 55%  to 60%. Wall motion was normal; there were no regional wall  motion abnormalities.  - Left atrium: The atrium was mildly dilated. Volume/bsa, ES  (1-plane Simpson&'s, A4C): 36.6 ml/m^2.   Cardiac Cath 07-27-2012: Procedural Findings: Hemodynamics: AO 145/103 with a mean of 122 LV 150/29  Coronary angiography: Coronary dominance: right  Left mainstem: Widely patent with no obstructive coronary disease.  Left anterior descending (LAD): Patent to the left ventricular apex. There is no significant stenosis  identified. There are 2 diagonals with no significant stenosis.  Left circumflex (LCx): The left circumflex is patent throughout. There were no irregularities noted. The vessel appears widely patent. The first OM is large without  significant disease. The left circumflex is dominant and gives off a PLA and a PDA branch, both are patent.  Right coronary artery (RCA): The right coronary artery is nondominant. It gives off a single RV marginal branch without significant stenosis.  Left ventriculography: Left ventricular systolic function is normal, LVEF is estimated at 55-65%, there is no significant mitral regurgitation   Final Conclusions:   1. Widely patent coronary arteries 2. Normal LV systolic function 3. Elevated LVEDP  Recommendations: The patient likely has noncardiac chest pain. She does have elevated filling pressures and will require ongoing aggressive risk reduction with treatment of her hypertension, diabetes, and efforts at weight loss. She will be eligible for discharge later this morning.  EKG:  EKG is ordered today.  The ekg ordered today demonstrates NSR 93 bpm, poor R wave progression, otherwise normal  Recent Labs: 03/26/2020: ALT 16; BUN 37; Creatinine, Ser 1.24; Hemoglobin 12.6; Platelets 384; Potassium 4.9; Sodium 129  Recent Lipid Panel    Component Value Date/Time   CHOL 274 (H) 04/10/2020 1200   TRIG 324 (H) 04/10/2020 1200   HDL 39 (L) 04/10/2020 1200   CHOLHDL 7.0 (H) 04/10/2020 1200   CHOLHDL 6.3 (H) 01/11/2017 0909   VLDL 52 (H) 01/11/2017 0909   LDLCALC 173 (H) 04/10/2020 1200   LDLDIRECT 164.9 11/15/2014 1148    Physical Exam:    VS:  BP 126/78   Pulse 93   Ht 5\' 2"  (1.575 m)   Wt (!) 334 lb (151.5 kg)   LMP 04/09/2020   SpO2 96%   BMI 61.09 kg/m     Wt Readings from Last 3 Encounters:  05/09/20 (!) 334 lb (151.5 kg)  04/25/20 (!) 334 lb (151.5 kg)  04/18/20 (!) 309 lb (140.2 kg)     GEN: Morbidly obese young woman in no acute  distress HEENT: Normal NECK: No JVD; No carotid bruits LYMPHATICS: No lymphadenopathy CARDIAC: RRR, no murmurs, rubs, gallops RESPIRATORY:  Clear to auscultation without rales, wheezing or rhonchi  ABDOMEN: Soft, non-tender, non-distended MUSCULOSKELETAL:  No edema; left BKA, right transmetatarsal amputation. SKIN: Warm and dry NEUROLOGIC:  Alert and oriented x 3 PSYCHIATRIC:  Normal affect   ASSESSMENT:    1. Other chest pain   2. Mixed hyperlipidemia   3. Essential hypertension    PLAN:    In order of problems listed above:  1. Chest pain with typical and atypical features.  History of normal coronary arteries noted.  I think it somewhat unlikely that she has developed CAD, although it has to be considered with longstanding uncontrolled diabetes.  I have recommended a Lexiscan Myoview stress test.  There probably will be some technical limitations due to her body habitus, but I would favor this approach first over an invasive cardiac catheterization.  Hopefully she will have a low risk study as I suspect she is experiencing noncardiac chest pain. 2. Treated with atorvastatin 40 mg daily.  Lifestyle modification discussed.  Treatment of her diabetes is critical she understands this. 3. Blood pressure is controlled on amlodipine.   Medication Adjustments/Labs and Tests Ordered: Current medicines are reviewed at length with the patient today.  Concerns regarding medicines are outlined above.  Orders Placed This Encounter  Procedures  . MYOCARDIAL PERFUSION IMAGING  . EKG 12-Lead   No orders of the defined types were placed in this encounter.   Patient Instructions  Medication Instructions:  Your physician recommends that you continue on your current medications as directed. Please refer to the  Current Medication list given to you today.  *If you need a refill on your cardiac medications before your next appointment, please call your pharmacy*   Lab Work: None If you have  labs (blood work) drawn today and your tests are completely normal, you will receive your results only by: Marland Kitchen MyChart Message (if you have MyChart) OR . A paper copy in the mail If you have any lab test that is abnormal or we need to change your treatment, we will call you to review the results.   Testing/Procedures: Your physician has requested that you have a lexiscan myoview. For further information please visit [email or web address]. Please follow instruction sheet, as given.    Follow-Up: At [facility], you and your health needs are our priority.  As part of our continuing mission to provide you with exceptional heart care, we have created designated Provider Care Teams.  These Care Teams include your primary Cardiologist (physician) and Advanced Practice Providers (APPs -  Physician Assistants and Nurse Practitioners) who all work together to provide you with the care you need, when you need it.  We recommend signing up for the patient portal called "MyChart".  Sign up information is provided on this After Visit Summary.  MyChart is used to connect with patients for Virtual Visits (Telemedicine).  Patients are able to view lab/test results, encounter notes, upcoming appointments, etc.  Non-urgent messages can be sent to your provider as well.   To learn more about what you can do with MyChart, go to [email or web address].    Your next appointment:     The format for your next appointment:   In Person  Provider:   You may see Dr. Sherren Mocha or one of the following Advanced Practice Providers on your designated Care Team:    Richardson Dopp, PA-C  Robbie Lis, [place]    Other Instructions      Signed, Sherren Mocha, MD  05/09/2020 2:12 PM    [name]

## 2020-05-09 NOTE — Patient Instructions (Signed)
Medication Instructions:  Your physician recommends that you continue on your current medications as directed. Please refer to the Current Medication list given to you today.  *If you need a refill on your cardiac medications before your next appointment, please call your pharmacy*   Lab Work: None If you have labs (blood work) drawn today and your tests are completely normal, you will receive your results only by: Marland Kitchen MyChart Message (if you have MyChart) OR . A paper copy in the mail If you have any lab test that is abnormal or we need to change your treatment, we will call you to review the results.   Testing/Procedures: Your physician has requested that you have a lexiscan myoview. For further information please visit [email or web address]. Please follow instruction sheet, as given.    Follow-Up: At [facility], you and your health needs are our priority.  As part of our continuing mission to provide you with exceptional heart care, we have created designated Provider Care Teams.  These Care Teams include your primary Cardiologist (physician) and Advanced Practice Providers (APPs -  Physician Assistants and Nurse Practitioners) who all work together to provide you with the care you need, when you need it.  We recommend signing up for the patient portal called "MyChart".  Sign up information is provided on this After Visit Summary.  MyChart is used to connect with patients for Virtual Visits (Telemedicine).  Patients are able to view lab/test results, encounter notes, upcoming appointments, etc.  Non-urgent messages can be sent to your provider as well.   To learn more about what you can do with MyChart, go to [email or web address].    Your next appointment:     The format for your next appointment:   In Person  Provider:   You may see Dr. Sherren Mocha or one of the following Advanced Practice Providers on your designated Care Team:    Richardson Dopp, PA-C  Robbie Lis,  [place]    Other Instructions

## 2020-05-13 ENCOUNTER — Ambulatory Visit (HOSPITAL_COMMUNITY): Payer: Medicaid Other | Attending: Cardiovascular Disease

## 2020-05-13 ENCOUNTER — Ambulatory Visit (INDEPENDENT_AMBULATORY_CARE_PROVIDER_SITE_OTHER): Payer: Medicaid Other | Admitting: Family Medicine

## 2020-05-13 ENCOUNTER — Other Ambulatory Visit: Payer: Self-pay

## 2020-05-13 VITALS — BP 128/80 | HR 103

## 2020-05-13 DIAGNOSIS — E1142 Type 2 diabetes mellitus with diabetic polyneuropathy: Secondary | ICD-10-CM

## 2020-05-13 DIAGNOSIS — Z794 Long term (current) use of insulin: Secondary | ICD-10-CM | POA: Diagnosis not present

## 2020-05-13 DIAGNOSIS — R0789 Other chest pain: Secondary | ICD-10-CM | POA: Diagnosis not present

## 2020-05-13 DIAGNOSIS — G4733 Obstructive sleep apnea (adult) (pediatric): Secondary | ICD-10-CM | POA: Diagnosis not present

## 2020-05-13 DIAGNOSIS — R110 Nausea: Secondary | ICD-10-CM | POA: Diagnosis not present

## 2020-05-13 DIAGNOSIS — E1165 Type 2 diabetes mellitus with hyperglycemia: Secondary | ICD-10-CM | POA: Diagnosis not present

## 2020-05-13 DIAGNOSIS — E1151 Type 2 diabetes mellitus with diabetic peripheral angiopathy without gangrene: Secondary | ICD-10-CM | POA: Diagnosis not present

## 2020-05-13 DIAGNOSIS — IMO0002 Reserved for concepts with insufficient information to code with codable children: Secondary | ICD-10-CM

## 2020-05-13 DIAGNOSIS — S90829A Blister (nonthermal), unspecified foot, initial encounter: Secondary | ICD-10-CM | POA: Diagnosis not present

## 2020-05-13 DIAGNOSIS — G8929 Other chronic pain: Secondary | ICD-10-CM | POA: Diagnosis not present

## 2020-05-13 DIAGNOSIS — E1140 Type 2 diabetes mellitus with diabetic neuropathy, unspecified: Secondary | ICD-10-CM | POA: Diagnosis not present

## 2020-05-13 LAB — MYOCARDIAL PERFUSION IMAGING
LV dias vol: 100 mL (ref 46–106)
LV sys vol: 52 mL
Peak HR: 100 {beats}/min
Rest HR: 93 {beats}/min
SDS: 5
SRS: 1
SSS: 6
TID: 1.01

## 2020-05-13 MED ORDER — HYDROCODONE-ACETAMINOPHEN 10-325 MG PO TABS: ORAL_TABLET | ORAL | 0 refills | Status: DC

## 2020-05-13 MED ORDER — TECHNETIUM TC 99M TETROFOSMIN IV KIT
32.4000 | PACK | Freq: Once | INTRAVENOUS | Status: AC | PRN
  Administered 2020-05-13: 32.4 via INTRAVENOUS
  Filled 2020-05-13: qty 33

## 2020-05-13 MED ORDER — REGADENOSON 0.4 MG/5ML IV SOLN
0.4000 mg | Freq: Once | INTRAVENOUS | Status: AC
  Administered 2020-05-13: 0.4 mg via INTRAVENOUS

## 2020-05-13 MED ORDER — AMINOPHYLLINE 25 MG/ML IV SOLN
75.0000 mg | Freq: Once | INTRAVENOUS | Status: AC
Start: 2020-05-13 — End: 2020-05-13
  Administered 2020-05-13: 75 mg via INTRAVENOUS

## 2020-05-13 MED ORDER — INSULIN ISOPHANE & REGULAR (HUMAN 70-30)100 UNIT/ML KWIKPEN: PEN_INJECTOR | SUBCUTANEOUS | 5 refills | Status: DC

## 2020-05-13 NOTE — Patient Instructions (Signed)
Refilled pain medication for 1 month  Referring to orthopedist  Get xray of your foot  Will order autotitrating CPAP  Increase insulin to 90 u in AM and 50u in PM  Follow up in 1 month , sooner if needed  Be well, Dr. [name]

## 2020-05-13 NOTE — Progress Notes (Signed)
  Date of Visit: 05/13/2020   SUBJECTIVE:   HPI:  [name] presents today for follow up.  OSA - had sleep study which demonstrated moderate OSA on 04/25/20. Unfortunately CPAP titration was not done during that study. Needs either another CPAP titration study or autopap. She is agreeable to autopap.  Diabetes - needs refill of insulin. Currently taking 70/30 85u in AM and 45u in PM. Fasting sugars are running in 300s. No lows. Also taking victoza 1.2mg  daily, just increased to this dose today. Plans to do this for a week and then will go up to 1.8mg  daily.  Foot blister - had blister on R foot on medial aspect of transmetatarsal amputation site. No drainage from it or fevers. She is concerned it is going to be reinfected and require another amputation (is s/p BKA on L side). She would like to see an orthopedist.  Chronic pain - taking norco 10-325mg  three times daily, with an extra dose on days she does physical therapy. Tolerating it well. Storing medication safely. No difficulty controlling how often she is taking the medication.  OBJECTIVE:   BP 128/80   Pulse (!) 103   SpO2 98%  Gen: no acute distress, pleasant, cooperative, well appearing HEENT: normocephalic, atraumatic  Lungs: normal work of breathing  Neuro: alert, speech normal, grossly nonfocal Ext: R side of prior transmetatarsal amputation with approximately quarter sized area of brown scabbing. No erythema or purulence. No surrounding warmth or tenderness.      ASSESSMENT/PLAN:   Health maintenance:  -at next visit need to address hep C screening -called patient day after visit and rescheduled her in [place] clinic for pap smear (missed appointment that was scheduled last week) -encouraged COVID vaccination, transportation is a current barrier  DM (diabetes mellitus) type II uncontrolled, periph vascular disorder ([place]) Uncontrolled based on fasting sugars. Increase 70/30 to 90u in AM and 50u in PM. Refilled today.  Follow up in 1 month   Encounter for chronic pain management Stable pain overall. Last fill 6/4, PDMP with appropriate findings. Refilled for another month, anticipate next rx needed early August.  Obstructive sleep apnea Will place order for autopap therapy at home in order to avoid CPAP titration study, per patient preference  Blister of foot No current signs of infection, but difficult to tell what may be a deeper infection (especially given her history of multiple amputations for distal limb infections). Check plain film xray Referring to orthopedics (patient requests anyone but Dr. Jess Barters office).  FOLLOW UP: Follow up in 1 mo for above issues Referring to orthopedics  [name], [place]

## 2020-05-14 ENCOUNTER — Ambulatory Visit: Payer: Medicaid Other | Admitting: Physical Therapy

## 2020-05-14 ENCOUNTER — Telehealth: Payer: Self-pay | Admitting: Pharmacist

## 2020-05-14 DIAGNOSIS — E1140 Type 2 diabetes mellitus with diabetic neuropathy, unspecified: Secondary | ICD-10-CM | POA: Diagnosis not present

## 2020-05-14 NOTE — Assessment & Plan Note (Signed)
Will place order for autopap therapy at home in order to avoid CPAP titration study, per patient preference

## 2020-05-14 NOTE — Telephone Encounter (Signed)
Contacted patient to discuss CGM readings and insulin dose adjustments.    Despite increase in dose this AM, patient continues to have blood sugar readings.  CGM is 100% of readings are very high for last two weeks (all blood sugars are > 250mg /dl).   Continued increase of insulin dosing today.  Increased both AM and PM 70/30 doses.  90 AM increased to 95 and  50 PM increased to 55 units starting tonight.   I plan to review CGM and call her back in two days.   She reminded me that she will need new prescriptions for higher doses of insulin at that time.

## 2020-05-14 NOTE — Assessment & Plan Note (Addendum)
Uncontrolled based on fasting sugars. Increase 70/30 to 90u in AM and 50u in PM. Refilled today. Follow up in 1 month

## 2020-05-14 NOTE — Assessment & Plan Note (Signed)
Stable pain overall. Last fill 6/4, PDMP with appropriate findings. Refilled for another month, anticipate next rx needed early August.

## 2020-05-14 NOTE — Assessment & Plan Note (Signed)
No current signs of infection, but difficult to tell what may be a deeper infection (especially given her history of multiple amputations for distal limb infections). Check plain film xray Referring to orthopedics (patient requests anyone but Dr. Jess Barters office).

## 2020-05-14 NOTE — Assessment & Plan Note (Signed)
>>  ASSESSMENT AND PLAN FOR INSULIN DEPENDENT TYPE 2 DIABETES MELLITUS ([place]) WRITTEN ON 05/14/2020  4:05 PM BY Razia Screws, [name], MD  Uncontrolled based on fasting sugars. Increase 70/30 to 90u in AM and 50u in PM. Refilled today. Follow up in 1 month

## 2020-05-15 DIAGNOSIS — E1140 Type 2 diabetes mellitus with diabetic neuropathy, unspecified: Secondary | ICD-10-CM | POA: Diagnosis not present

## 2020-05-16 ENCOUNTER — Telehealth: Payer: Self-pay

## 2020-05-16 ENCOUNTER — Telehealth: Payer: Self-pay | Admitting: Pharmacist

## 2020-05-16 DIAGNOSIS — Z794 Long term (current) use of insulin: Secondary | ICD-10-CM

## 2020-05-16 DIAGNOSIS — E1142 Type 2 diabetes mellitus with diabetic polyneuropathy: Secondary | ICD-10-CM

## 2020-05-16 DIAGNOSIS — E1140 Type 2 diabetes mellitus with diabetic neuropathy, unspecified: Secondary | ICD-10-CM | POA: Diagnosis not present

## 2020-05-16 MED ORDER — INSULIN ISOPHANE & REGULAR (HUMAN 70-30)100 UNIT/ML KWIKPEN: PEN_INJECTOR | SUBCUTANEOUS | 5 refills | Status: DC

## 2020-05-16 NOTE — Telephone Encounter (Signed)
Community message sent to [employer] for CPAP. Will await response.   Talbot Grumbling, RN

## 2020-05-16 NOTE — Telephone Encounter (Signed)
Contacted patient and discussed improvement in her glycemic control in the last 48 hours.  She has had readings in the 200-300 ranges and has not had readings > 400.   She reports taking Insulin 70/30 at 95 units this AM AND 55 units yesterday evening.   She is willing to increase dose further.  We agreed to increase evening dose to 60 units AND increase morning dose to 100 units.  New prescription sent to her pharmacy.   Follow-up planned for ~ 1 week by phone.   Patient is currently reducing the dose of Victoza due to reflux symptoms that are likely due to Victoza.  Reassess dose of Victoza at next contact.

## 2020-05-16 NOTE — Telephone Encounter (Signed)
Please see below message from [employer].   New, [name], [name]; [name], Belinda Cruz, RN; [name] received, Thank you   Talbot Grumbling, RN

## 2020-05-17 DIAGNOSIS — E1140 Type 2 diabetes mellitus with diabetic neuropathy, unspecified: Secondary | ICD-10-CM | POA: Diagnosis not present

## 2020-05-18 DIAGNOSIS — E1140 Type 2 diabetes mellitus with diabetic neuropathy, unspecified: Secondary | ICD-10-CM | POA: Diagnosis not present

## 2020-05-19 ENCOUNTER — Telehealth: Payer: Self-pay | Admitting: Pharmacist

## 2020-05-19 DIAGNOSIS — E1140 Type 2 diabetes mellitus with diabetic neuropathy, unspecified: Secondary | ICD-10-CM | POA: Diagnosis not present

## 2020-05-19 NOTE — Telephone Encounter (Signed)
Reviewed and agree.

## 2020-05-19 NOTE — Telephone Encounter (Signed)
Contacted patient regarding CGM control and current blood glucose regimen.    Patient reports she had not yet checked her CGM today but was pleasantly surprised when her readings have been in goal range for the last 6 hours.  She states she has not yet eaten anything for the day, feels OK and reports adherence to insulin regimen  100units in the AM and 60 units in the PM.    We agreed to continue same dose at this time.  I shared that I would be out of town for several days and would not likely follow-up this week.  She plans to call for any symptoms of low blood sugar or any other concerns.

## 2020-05-20 DIAGNOSIS — E1140 Type 2 diabetes mellitus with diabetic neuropathy, unspecified: Secondary | ICD-10-CM | POA: Diagnosis not present

## 2020-05-21 ENCOUNTER — Ambulatory Visit: Payer: Medicaid Other | Admitting: Physical Therapy

## 2020-05-21 ENCOUNTER — Telehealth: Payer: Self-pay | Admitting: Family Medicine

## 2020-05-21 DIAGNOSIS — E1140 Type 2 diabetes mellitus with diabetic neuropathy, unspecified: Secondary | ICD-10-CM | POA: Diagnosis not present

## 2020-05-21 NOTE — Telephone Encounter (Signed)
[facility] Emergency After Hours Line:  Patient calling emergency after hours line due to sudden onset chest pain and SOB. She notes she was lying in bed to go to sleep with this pain came on suddenly. She notes the pain in her chest feels like "someone is pounding on my chest". She also endorses left hand pain. She notes it has been going on for about 10 minutes. She notes that has had this before but it wasn't as bad. Nothing makes it better and nothing makes it worse. The pain is located mid-left side of the chest. No pain to palpation per patient. She is unable to check her vitals. No association with food.   She had a Normal pharmacologic nuclear study with no evidence for prior infarct or ischemia with normal LVEF on 05/13/20. She saw cardiology on 6/4 who felt unlikely cardiac chest pain. Patient has a history of uncontrolled DM, normal cardiac catheterization in 2013, morbid obesity, and multiple amputations.  Although less likely cardiac in origin, given onset of pounding chest pain, SOB, and left arm pain that is not reproducible to palpation or associated with food recommended patient report to ED for evaluation. Differential also includes anxiety/panic attack. She may trial 325mg  ASA in the mean time. She voiced understanding and agreement with plan.

## 2020-05-22 ENCOUNTER — Ambulatory Visit: Payer: Medicaid Other

## 2020-05-22 DIAGNOSIS — E1140 Type 2 diabetes mellitus with diabetic neuropathy, unspecified: Secondary | ICD-10-CM | POA: Diagnosis not present

## 2020-05-23 DIAGNOSIS — E1140 Type 2 diabetes mellitus with diabetic neuropathy, unspecified: Secondary | ICD-10-CM | POA: Diagnosis not present

## 2020-05-24 DIAGNOSIS — E1140 Type 2 diabetes mellitus with diabetic neuropathy, unspecified: Secondary | ICD-10-CM | POA: Diagnosis not present

## 2020-05-25 DIAGNOSIS — E1140 Type 2 diabetes mellitus with diabetic neuropathy, unspecified: Secondary | ICD-10-CM | POA: Diagnosis not present

## 2020-05-26 DIAGNOSIS — E1140 Type 2 diabetes mellitus with diabetic neuropathy, unspecified: Secondary | ICD-10-CM | POA: Diagnosis not present

## 2020-05-27 ENCOUNTER — Emergency Department (HOSPITAL_COMMUNITY): Payer: Medicaid Other

## 2020-05-27 ENCOUNTER — Emergency Department (HOSPITAL_COMMUNITY)
Admission: EM | Admit: 2020-05-27 | Discharge: 2020-05-28 | Disposition: A | Discharge status: Home / Self Care | Payer: Medicaid Other | Attending: Emergency Medicine | Admitting: Emergency Medicine

## 2020-05-27 ENCOUNTER — Other Ambulatory Visit: Payer: Self-pay

## 2020-05-27 ENCOUNTER — Telehealth: Payer: Self-pay | Admitting: Pharmacist

## 2020-05-27 ENCOUNTER — Encounter (HOSPITAL_COMMUNITY): Payer: Self-pay | Admitting: Emergency Medicine

## 2020-05-27 DIAGNOSIS — E119 Type 2 diabetes mellitus without complications: Secondary | ICD-10-CM | POA: Insufficient documentation

## 2020-05-27 DIAGNOSIS — R079 Chest pain, unspecified: Secondary | ICD-10-CM

## 2020-05-27 DIAGNOSIS — I471 Supraventricular tachycardia: Secondary | ICD-10-CM | POA: Insufficient documentation

## 2020-05-27 DIAGNOSIS — J449 Chronic obstructive pulmonary disease, unspecified: Secondary | ICD-10-CM | POA: Insufficient documentation

## 2020-05-27 DIAGNOSIS — Z7984 Long term (current) use of oral hypoglycemic drugs: Secondary | ICD-10-CM | POA: Diagnosis not present

## 2020-05-27 DIAGNOSIS — I10 Essential (primary) hypertension: Secondary | ICD-10-CM | POA: Diagnosis not present

## 2020-05-27 DIAGNOSIS — E1165 Type 2 diabetes mellitus with hyperglycemia: Secondary | ICD-10-CM | POA: Diagnosis not present

## 2020-05-27 DIAGNOSIS — Z87891 Personal history of nicotine dependence: Secondary | ICD-10-CM | POA: Diagnosis not present

## 2020-05-27 DIAGNOSIS — Z79899 Other long term (current) drug therapy: Secondary | ICD-10-CM | POA: Diagnosis not present

## 2020-05-27 DIAGNOSIS — R0789 Other chest pain: Secondary | ICD-10-CM | POA: Diagnosis not present

## 2020-05-27 DIAGNOSIS — R000 Tachycardia, unspecified: Secondary | ICD-10-CM | POA: Diagnosis not present

## 2020-05-27 DIAGNOSIS — R0602 Shortness of breath: Secondary | ICD-10-CM | POA: Diagnosis not present

## 2020-05-27 LAB — CBC
HCT: 36.8 % (ref 36.0–46.0)
Hemoglobin: 11.4 g/dL — ABNORMAL LOW (ref 12.0–15.0)
MCH: 29 pg (ref 26.0–34.0)
MCHC: 31 g/dL (ref 30.0–36.0)
MCV: 93.6 fL (ref 80.0–100.0)
Platelets: 366 10*3/uL (ref 150–400)
RBC: 3.93 MIL/uL (ref 3.87–5.11)
RDW: 12.9 % (ref 11.5–15.5)
WBC: 10.2 10*3/uL (ref 4.0–10.5)
nRBC: 0 % (ref 0.0–0.2)

## 2020-05-27 LAB — TROPONIN I (HIGH SENSITIVITY)
Troponin I (High Sensitivity): 17 ng/L (ref <18)
Troponin I (High Sensitivity): 18 ng/L — ABNORMAL HIGH (ref <18)

## 2020-05-27 LAB — BASIC METABOLIC PANEL
Anion gap: 9 (ref 5–15)
BUN: 26 mg/dL — ABNORMAL HIGH (ref 6–20)
CO2: 24 mmol/L (ref 22–32)
Calcium: 8.7 mg/dL — ABNORMAL LOW (ref 8.9–10.3)
Chloride: 104 mmol/L (ref 98–111)
Creatinine, Ser: 1.1 mg/dL — ABNORMAL HIGH (ref 0.44–1.00)
GFR calc Af Amer: >60 mL/min (ref >60)
GFR calc non Af Amer: >60 mL/min (ref >60)
Glucose, Bld: 367 mg/dL — ABNORMAL HIGH (ref 70–99)
Potassium: 4.2 mmol/L (ref 3.5–5.1)
Sodium: 137 mmol/L (ref 135–145)

## 2020-05-27 LAB — I-STAT BETA HCG BLOOD, ED (MC, WL, AP ONLY): I-stat hCG, quantitative: <5 m[IU]/mL (ref <5)

## 2020-05-27 MED ORDER — HYDROCODONE-ACETAMINOPHEN 5-325 MG PO TABS
1.0000 | ORAL_TABLET | Freq: Once | ORAL | Status: AC
  Administered 2020-05-27: 1 via ORAL
  Filled 2020-05-27: qty 1

## 2020-05-27 MED ORDER — IOHEXOL 350 MG/ML SOLN
100.0000 mL | Freq: Once | INTRAVENOUS | Status: AC | PRN
  Administered 2020-05-27: 100 mL via INTRAVENOUS

## 2020-05-27 MED ORDER — SODIUM CHLORIDE 0.9% FLUSH
3.0000 mL | Freq: Once | INTRAVENOUS | Status: AC
  Administered 2020-05-27: 3 mL via INTRAVENOUS

## 2020-05-27 MED ORDER — GABAPENTIN 300 MG PO CAPS
400.0000 mg | ORAL_CAPSULE | Freq: Once | ORAL | Status: AC
  Administered 2020-05-27: 400 mg via ORAL
  Filled 2020-05-27: qty 1

## 2020-05-27 NOTE — Telephone Encounter (Signed)
Contacted patient and quickly determined she is currently in the emergency department.  She reports that her chest started hurting this AM and she has been told she has SVT.   She reports adherence with medications.  I plan to follow-up later this week by phone.

## 2020-05-27 NOTE — ED Triage Notes (Signed)
Patient arrives to ED with [facility] to triage with complaints of chest pain  And SOB that woke her up around 4am this morning. Patient stated the pain never went away and she called 911. EMS arrived and found pateint to be in SVT in a rate of 170-180. Patient was given 6mg  adenosine then 12mg  adenosine and converted to NSR. Patient now CP free.

## 2020-05-27 NOTE — ED Notes (Signed)
Pt to CT via stretcher

## 2020-05-27 NOTE — Discharge Instructions (Addendum)
Please call your cardiologist to schedule a reevaluation.   If you develop any new or worsening symptoms please do not hesitate to return to the emergency department.  It was a pleasure to meet you.

## 2020-05-27 NOTE — ED Notes (Signed)
Patient aware of pending CTA of chest, advised can take sip of water with meds. Patient is eating food brought from home by family member.

## 2020-05-27 NOTE — ED Provider Notes (Signed)
[place] EMERGENCY DEPARTMENT Provider Note   CSN: [id number] Arrival date & time: 05/27/20  0945     History Chief Complaint  Patient presents with  . Chest Pain  . SVT    [name] is a 44 y.o. female.  HPI   Patient is a 44 year old female with a history of morbid obesity, COPD, diabetes mellitus, BKA of the left leg, amputation of all toes of the right foot.  Patient states that she has been experiencing intermittent chest pain for the last few months.  Patient had a stress test performed on May 8 by Dr. Burt Knack, which was reassuring.  Patient states she woke this morning with left-sided chest pain. She was short of breath at this time.  She called 911 and EMS determined that she was in SVT.  She was given 6 mg of adenosine followed by 12 mg of adenosine and converted to normal sinus rhythm.  While waiting in the emergency department she states she has continued to have 8/10 left-sided chest pain.  It is constant.  She reports intermittent shortness of breath but none currently. No fevers, chills, abdominal pain, n/v/d, urinary changes, syncope.      Past Medical History:  Diagnosis Date  . Acute osteomyelitis of ankle or foot, left ([place]) 01/31/2018  . Alveolar hypoventilation   . Anemia    not on iron pill  . Asthma   . Bipolar 2 disorder ([place])   . Carpal tunnel syndrome on right    recurrent  . Cellulitis 08/2010-08/2011  . Chronic pain   . COPD (chronic obstructive pulmonary disease) ([name])    Symbicort daily and Proventil as needed  . Costochondritis   . Depression   . Diabetes mellitus type II, uncontrolled ([place]) 2000   Type 2, Uncontrolled.Takes Lantus daily.Fasting blood sugar runs 150  . Dizziness    occasionally  . Drug-seeking behavior   . GERD (gastroesophageal reflux disease)    takes Pantoprazole and Zantac daily  . Headache    migraine-last one about a yr ago.Topamax daily  . HLD (hyperlipidemia)    takes Atorvastatin daily  .  Hypertension    takes Lisinopril and Coreg daily  . Morbid obesity ([place])   . Muscle spasm    takes Flexeril as needed  . Nocturia   . OSA on CPAP   . Peripheral neuropathy    takes Gabapentin daily  . Pneumonia    "walking" several yrs ago and as a baby (12/05/2018)  . Rectal fissure   . Restless leg   . Syncope 02/25/2016  . Urinary frequency   . Varicose veins    Right medial thigh and Left leg     Patient Active Problem List   Diagnosis Date Noted  . Blister of foot 04/21/2020  . Exposure to mold 04/21/2020  . Constipation 04/03/2020  . Status post amputation of toe of right foot ([place]) 02/13/2020  . UTI (urinary tract infection) 06/28/2019  . Amputation above knee ([place]) 06/23/2019  . Left shoulder pain 06/23/2019  . Abscess of buttock, right 05/17/2019  . Severe protein-calorie malnutrition ([place])   . 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[place]) 04/11/2019  . Type 2 diabetes mellitus with diabetic polyneuropathy, with long-term current use of insulin ([place]) 04/11/2019  . Uncontrolled type 2 diabetes mellitus with insulin therapy ([place]) 04/16/2018  . Allergic rhinitis 03/06/2018  . Unilateral complete BKA, left, sequela ([place])   . Class 3 severe obesity due to excess calories with serious comorbidity and body  mass index (BMI) of 50.0 to 59.9 in adult [facility])   . AKI (acute kidney injury) ([place])   . Decreased pedal pulses 06/10/2017  . Unilateral primary osteoarthritis, right knee 10/22/2016  . Primary osteoarthritis of first carpometacarpal joint of left hand 07/30/2016  . Diabetic neuropathy ([place]) 07/14/2016  . Nausea 03/17/2016  . De Quervain's tenosynovitis, bilateral 11/01/2015  . Vitamin D deficiency 09/05/2015  . Recurrent candidiasis of vagina 09/05/2015  . Varicose veins of leg with complications 27/78/2423  . Restless leg syndrome 10/17/2014  . Chronic sinusitis 07/18/2014  . Encounter for chronic pain management 06/30/2013  . HLD (hyperlipidemia) 11/19/2012  . Chest pain  06/27/2012  . Right carpal tunnel syndrome 09/01/2011  . Bilateral knee pain 09/01/2011  . DM (diabetes mellitus) type II uncontrolled, periph vascular disorder ([place]) 05/22/2008  . Morbid obesity ([place]) 05/22/2008  . 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 Depression 05/22/2008  . Obstructive sleep apnea 05/22/2008  . Hypertension 05/22/2008  . Asthma 05/22/2008  . GERD 05/22/2008    Past Surgical History:  Procedure Laterality Date  . AMPUTATION Left 02/01/2018   Procedure: LEFT FOURTH AND 5TH TOE RAY AMPUTATION;  Surgeon: [name], MD;  Location: [place];  Service: Orthopedics;  Laterality: Left;  . AMPUTATION Left 03/03/2018   Procedure: LEFT BELOW KNEE AMPUTATION;  Surgeon: [name], MD;  Location: [place];  Service: Orthopedics;  Laterality: Left;  . CARPAL TUNNEL RELEASE Bilateral   . CESAREAN SECTION  2007  . INCISION AND DRAINAGE PERIRECTAL ABSCESS Left 05/18/2019   Procedure: IRRIGATION AND DEBRIDEMENT OF PANNIS ABSCESS, POSSIBLE DEBRIDEMENT OF BUTTOCK WOUND;  Surgeon: Donnie Mesa, MD;  Location: [place];  Service: General;  Laterality: Left;  . IRRIGATION AND DEBRIDEMENT BUTTOCKS Left 05/17/2019   Procedure: IRRIGATION AND DEBRIDEMENT BUTTOCKS;  Surgeon: Donnie Mesa, MD;  Location: [place];  Service: General;  Laterality: Left;  . KNEE ARTHROSCOPY Right 07/17/2010  . LEFT HEART CATHETERIZATION WITH CORONARY ANGIOGRAM N/A 07/27/2012   Procedure: LEFT HEART CATHETERIZATION WITH CORONARY ANGIOGRAM;  Surgeon: Sherren Mocha, MD;  Location: [facility] CATH LAB;  Service: Cardiovascular;  Laterality: N/A;  . MASS EXCISION N/A 06/29/2013   Procedure:  WIDE LOCAL EXCISION OF POSTERIOR NECK ABSCESS;  Surgeon: Ralene Ok, MD;  Location: [place];  Service: General;  Laterality: N/A;  . REPAIR KNEE LIGAMENT Left    "fixed ligaments and chipped patella"  . right transmetatarsal amputation        OB History    Gravida  2   Para  1   Term      Preterm      AB  1   Living    1     SAB  1   TAB      Ectopic      Multiple      Live Births              Family History  Problem Relation Age of Onset  . Diabetes Mother   . Hyperlipidemia Mother   . Depression Mother   . GER disease Mother   . Allergic rhinitis Mother   . Restless legs syndrome Mother   . Heart attack Paternal Uncle   . Heart disease Paternal Grandmother   . Heart attack Paternal Grandmother   . Heart attack Paternal Grandfather   . Heart disease Paternal Grandfather   . Heart attack Father   . Migraines Sister   . Cancer Maternal Grandmother  COLON  . Hypertension Maternal Grandmother   . Hyperlipidemia Maternal Grandmother   . Diabetes Maternal Grandmother   . Other Maternal Grandfather        GUN SHOT  . Anxiety disorder Sister     Social History   Tobacco Use  . Smoking status: Former Smoker    Packs/day: 0.30    Years: 0.30    Pack years: 0.09    Types: Cigarettes    Quit date: 12/06/1993    Years since quitting: 26.4  . Smokeless tobacco: Never Used  Vaping Use  . Vaping Use: Never used  Substance Use Topics  . Alcohol use: No    Alcohol/week: 0.0 standard drinks  . Drug use: Yes    Comment: OD attempts on home meds      Home Medications Prior to Admission medications   Medication Sig Start Date End Date Taking? Authorizing Provider  Accu-Chek Softclix Lancets lancets Use to check blood glucose four times daily 04/17/20   Martyn Malay, MD  amLODipine (NORVASC) 10 MG tablet Take 1 tablet (10 mg total) by mouth daily. 05/25/19   [name], MD  atorvastatin (LIPITOR) 40 MG tablet Take 1 tablet (40 mg total) by mouth daily. 04/11/20   [name], MD  budesonide-formoterol [facility]) 160-4.5 MCG/ACT inhaler Inhale 2 puffs into the lungs 2 (two) times daily. 05/09/20   [name], MD  cetirizine (ZYRTEC) 10 MG tablet Take 10 mg by mouth daily as needed for allergies.    [provider]  Cholecalciferol 1000 units tablet  Take 1 tablet (1,000 Units total) by mouth daily. 04/05/17   [name], MD  Continuous Blood Gluc Sensor (DEXCOM G6 SENSOR) MISC Inject 1 applicator into the skin as directed. Change sensor every 10 days. 04/10/20   Leavy Cella, RPH-CPP  Continuous Blood Gluc Transmit (DEXCOM G6 TRANSMITTER) MISC Inject 1 Device into the skin as directed. Reuse 8 times with sensor changes. 04/10/20   Leavy Cella, RPH-CPP  cyclobenzaprine (FLEXERIL) 5 MG tablet TAKE 1 TABLET BY MOUTH 3 TIMES DAILY AS NEEDED FOR MUSCLE SPASMS 04/11/20   [name], MD  gabapentin (NEURONTIN) 600 MG tablet TAKE 2 TABLETS BY MOUTH 3 TIMES DAILY 05/09/20   [name], MD  glucose blood (ACCU-CHEK GUIDE) test strip USE T0 CHECK BLOOD SUGAR 3 TIMES DAILY 04/18/20   Leavy Cella, RPH-CPP  HYDROcodone-acetaminophen (NORCO) 10-325 MG tablet Take one tablet every 6 to 8 hours as needed. 05/13/20   [name], MD  insulin isophane & regular human (HUMULIN 70/30 MIX) (70-30) 100 UNIT/ML KwikPen Inject 100 Units into the skin daily with breakfast AND 60 Units daily with supper. 05/16/20   [name], MD  Insulin Pen Needle (SURE COMFORT PEN NEEDLES) 32G X 4 MM MISC Use to inject insulin as indicated 04/17/20   Martyn Malay, MD  Lancets (ACCU-CHEK MULTICLIX) lancets 1 each by Other route as needed for other. Use as instructed 04/18/20   Leavy Cella, RPH-CPP  ondansetron (ZOFRAN ODT) 4 MG disintegrating tablet Take 1 tablet (4 mg total) by mouth every 8 (eight) hours as needed for nausea or vomiting. 03/26/20   Albrizze, Verline Lema E, PA-C  pantoprazole (PROTONIX) 40 MG tablet Take 1 tablet (40 mg total) by mouth daily. 03/16/18   Angiulli, Lavon Paganini, PA-C  PROVENTIL HFA 108 (90 Base) MCG/ACT inhaler Inhale 1-2 puffs into the lungs every 6 (six) hours as needed for wheezing or shortness  of breath. Patient taking differently: Inhale 2 puffs into the lungs every 4 (four) hours as needed for wheezing.  05/11/19    [name], MD  rOPINIRole (REQUIP) 1 MG tablet Take 1 tablet (1 mg total) by mouth at bedtime. 04/03/20   [name], MD  terconazole (TERAZOL 7) 0.4 % vaginal cream Place 1 applicator vaginally at bedtime. 04/03/20   [name], MD  VICTOZA 18 MG/3ML SOPN inject 0.6 MG SUBCUTANEOUSLY ONCE A DAY FOR 1 WEEK, THEN INCREASE TO 1.2 MG ONCE daily max 1.8 MG 04/16/20   [name], MD    Allergies    Cefepime, Kiwi extract, Morphine and related, Trental [pentoxifylline], and Nubain [nalbuphine hcl]  Review of Systems   Review of Systems  All other systems reviewed and are negative. Ten systems reviewed and are negative for acute change, except as noted in the HPI.    Physical Exam Updated Vital Signs BP (!) 154/97 (BP Location: Left Arm)   Pulse (!) 101   Temp 97.8 F (36.6 C) (Oral)   Resp 20   Ht 5\' 2"  (1.575 m)   Wt (!) 151.5 kg   SpO2 98%   BMI 61.09 kg/m   Physical Exam Vitals and nursing note reviewed.  Constitutional:      General: She is in acute distress.     Appearance: Normal appearance. She is well-developed. She is obese. She is not ill-appearing, toxic-appearing or diaphoretic.  HENT:     Head: Normocephalic and atraumatic.     Right Ear: External ear normal.     Left Ear: External ear normal.     Nose: Nose normal.     Mouth/Throat:     Mouth: Mucous membranes are moist.     Pharynx: Oropharynx is clear. No oropharyngeal exudate or posterior oropharyngeal erythema.  Eyes:     Extraocular Movements: Extraocular movements intact.  Cardiovascular:     Rate and Rhythm: Regular rhythm. Tachycardia present.     Pulses: Normal pulses.          Radial pulses are 2+ on the right side and 2+ on the left side.     Heart sounds: Normal heart sounds. Heart sounds not distant. No murmur heard.  No systolic murmur is present.  No friction rub. No gallop.   Pulmonary:     Effort: Pulmonary effort is normal. No tachypnea, accessory muscle  usage or respiratory distress.     Breath sounds: Normal breath sounds. No stridor. No decreased breath sounds, wheezing, rhonchi or rales.  Chest:     Chest wall: No deformity, tenderness, crepitus or edema.  Abdominal:     General: Abdomen is flat.     Palpations: Abdomen is soft.     Tenderness: There is no abdominal tenderness.  Musculoskeletal:        General: Normal range of motion.     Cervical back: Normal range of motion and neck supple. No tenderness.     Comments: Prior BKA noted on the left leg.  Amputation of all toes of the right foot.  Skin:    General: Skin is warm and dry.  Neurological:     General: No focal deficit present.     Mental Status: She is alert and oriented to person, place, and time.  Psychiatric:        Mood and Affect: Mood normal.        Behavior: Behavior normal.    ED Results / Procedures / Treatments  Labs (all labs ordered are listed, but only abnormal results are displayed) Labs Reviewed  BASIC METABOLIC PANEL - Abnormal; Notable for the following components:      Result Value   Glucose, Bld 367 (*)    BUN 26 (*)    Creatinine, Ser 1.10 (*)    Calcium 8.7 (*)    All other components within normal limits  CBC - Abnormal; Notable for the following components:   Hemoglobin 11.4 (*)    All other components within normal limits  TROPONIN I (HIGH SENSITIVITY) - Abnormal; Notable for the following components:   Troponin I (High Sensitivity) 18 (*)    All other components within normal limits  I-STAT BETA HCG BLOOD, ED ([facility], [facility], [facility] ONLY)  TROPONIN I (HIGH SENSITIVITY)   EKG EKG Interpretation  Date/Time:  Tuesday May 27 2020 09:48:03 EDT Ventricular Rate:  105 PR Interval:  158 QRS Duration: 90 QT Interval:  356 QTC Calculation: 470 R Axis:   99 Text Interpretation: Sinus tachycardia Rightward axis Low voltage QRS Cannot rule out Anterior infarct , age undetermined Abnormal ECG poor baseline, will repeat Confirmed by Wandra Arthurs  ([id number]) on 05/27/2020 4:50:07 PM   Radiology DG Chest 2 View  Result Date: 05/27/2020 CLINICAL DATA:  Chest pain and shortness of breath. EXAM: CHEST - 2 VIEW COMPARISON:  04/14/2020 FINDINGS: The heart size and mediastinal contours are within normal limits. Both lungs are clear. The visualized skeletal structures are unremarkable. IMPRESSION: No active cardiopulmonary disease. Electronically Signed   By: Lorriane Shire M.D.   On: 05/27/2020 10:37   CT Angio Chest PE W and/or Wo Contrast  Result Date: 05/27/2020 CLINICAL DATA:  Chest pain and shortness of breath. EXAM: CT ANGIOGRAPHY CHEST WITH CONTRAST TECHNIQUE: Multidetector CT imaging of the chest was performed using the standard protocol during bolus administration of intravenous contrast. Multiplanar CT image reconstructions and MIPs were obtained to evaluate the vascular anatomy. CONTRAST:  174mL OMNIPAQUE IOHEXOL 350 MG/ML SOLN COMPARISON:  June 27, 2012 FINDINGS: Cardiovascular: Satisfactory opacification of the pulmonary arteries to the segmental level. No evidence of pulmonary embolism. Normal heart size. No pericardial effusion. Mediastinum/Nodes: No enlarged mediastinal, hilar, or axillary lymph nodes. Thyroid gland, trachea, and esophagus demonstrate no significant findings. Lungs/Pleura: Lungs are clear. No pleural effusion or pneumothorax. Upper Abdomen: No acute abnormality. Musculoskeletal: No chest wall abnormality. No acute or significant osseous findings. Review of the MIP images confirms the above findings. IMPRESSION: Negative examination for pulmonary embolism or acute cardiopulmonary disease. Electronically Signed   By: Virgina Norfolk M.D.   On: 05/27/2020 21:04   Procedures Procedures (including critical care time)  Medications Ordered in ED Medications  gabapentin (NEURONTIN) capsule 400 mg (has no administration in time range)  HYDROcodone-acetaminophen (NORCO/VICODIN) 5-325 MG per tablet 1 tablet (has no  administration in time range)  sodium chloride flush (NS) 0.9 % injection 3 mL (3 mLs Intravenous Given 05/27/20 1743)  HYDROcodone-acetaminophen (NORCO/VICODIN) 5-325 MG per tablet 1 tablet (1 tablet Oral Given 05/27/20 1743)  iohexol (OMNIPAQUE) 350 MG/ML injection 100 mL (100 mLs Intravenous Contrast Given 05/27/20 2038)   ED Course  I have reviewed the triage vital signs and the nursing notes.  Pertinent labs & imaging results that were available during my care of the patient were reviewed by me and considered in my medical decision making (see chart for details).  Clinical Course as of May 27 2045  Tue May 27, 2020  1915 History of poorly managed diabetes.  Actually appears improved compared to priors.  Glucose(!): 367 [LJ]  1915 Similar to prior values  BUN(!): 26 [LJ]  1915 Similar to prior values  Creatinine(!): 1.10 [LJ]  1915 Slight decrease from 12.6, 2 months ago  Hemoglobin(!): 11.4 [LJ]    Clinical Course User Index [LJ] Rayna Sexton, PA-C   MDM Rules/Calculators/A&P                          Patient is a 44 year old female with a significant medical history as noted above.  Patient has a history of BKA of the left leg.  All of the toes of the right foot have been amputated.  She is generally in very poor health.  She presents today due to chest pain as well as shortness of breath.  EMS found patient to be in SVT patient was given adenosine and was converted to normal sinus rhythm.  She states she has continued to have chest pain since arrival.  Initial troponin 17, repeat troponin 18.  ECG shows sinus tachycardia.  Patient has been tachycardic around 100 since arrival. Labs otherwise appear similar to baseline.   This patient was discussed with and evaluated by my attending physician Dr. Shirlyn Goltz.  We will additionally order a CT angiogram to rule out PE due to her chest pain, tachycardia, state of health, as well as chronic lack of mobility.  CTA is negative.  I  discussed this with the patient.  I believe patient will be safe for discharge with strict outpatient cardiology follow up.  I discussed this with the patient.  She understands she is to call her cardiologist, Dr. Burt Knack, first thing tomorrow to discuss this visit as well as her diagnosis.  She was given very strict return precautions and knows to return to the emergency department any new or worsening symptoms.  Patient was given 1 Vicodin for pain while in the emergency department.  Her questions were answered and she was amicable at the time of discharge.  Her vital signs are stable.  Patient discharged to home/self care.  Condition at discharge: Stable  Note: Portions of this report may have been transcribed using voice recognition software. Every effort was made to ensure accuracy; however, inadvertent computerized transcription errors may be present.    Final Clinical Impression(s) / ED Diagnoses Final diagnoses:  SVT (supraventricular tachycardia) ([place])  Chest pain, unspecified type   Rx / DC Orders ED Discharge Orders    None       Rayna Sexton, PA-C 05/27/20 2114    Drenda Freeze, MD 05/28/20 1450

## 2020-05-28 ENCOUNTER — Ambulatory Visit: Payer: Medicaid Other | Admitting: Physical Therapy

## 2020-05-28 ENCOUNTER — Telehealth: Payer: Self-pay

## 2020-05-28 DIAGNOSIS — I10 Essential (primary) hypertension: Secondary | ICD-10-CM | POA: Diagnosis not present

## 2020-05-28 DIAGNOSIS — R079 Chest pain, unspecified: Secondary | ICD-10-CM | POA: Diagnosis not present

## 2020-05-28 DIAGNOSIS — Z7401 Bed confinement status: Secondary | ICD-10-CM | POA: Diagnosis not present

## 2020-05-28 DIAGNOSIS — R0789 Other chest pain: Secondary | ICD-10-CM | POA: Diagnosis not present

## 2020-05-28 DIAGNOSIS — M2550 Pain in unspecified joint: Secondary | ICD-10-CM | POA: Diagnosis not present

## 2020-05-28 DIAGNOSIS — E1140 Type 2 diabetes mellitus with diabetic neuropathy, unspecified: Secondary | ICD-10-CM | POA: Diagnosis not present

## 2020-05-28 MED ORDER — METOPROLOL SUCCINATE ER 25 MG PO TB24
25.0000 mg | ORAL_TABLET | Freq: Every day | ORAL | 11 refills | Status: DC
Start: 2020-05-28 — End: 2020-10-10

## 2020-05-28 NOTE — ED Notes (Signed)
Pt verbalized understanding of d/c instructions, follow up care and s/s requiring return to ed. Pt had no further questions and was transported home in care of [facility]

## 2020-05-28 NOTE — Telephone Encounter (Signed)
Confirmed with the patient she is not taking dilt or metoprolol. Toprol 25 mg qd called in to requested pharmacy. Scheduled her for follow-up visit 7/8. She was grateful for assistance.

## 2020-05-28 NOTE — Telephone Encounter (Signed)
-----   Message from Sherren Mocha, MD sent at 05/28/2020 10:44 AM EDT ----- Sent you message now seeing this - APP visit. If not on metoprolol or dilt, would start Toprol 25 mg daily ----- Message ----- From: Theodoro [name], RN Sent: 05/28/2020   7:58 AM EDT To: Sherren Mocha, MD  Aniceto Boss went to the ER yesterday and was told to call. Rose came and saw me first thing this morning. Do you want me to get her a follow-up appointment or order any testing first? Thank you!

## 2020-05-29 ENCOUNTER — Other Ambulatory Visit: Payer: Self-pay

## 2020-05-29 ENCOUNTER — Emergency Department (HOSPITAL_BASED_OUTPATIENT_CLINIC_OR_DEPARTMENT_OTHER)
Admission: EM | Admit: 2020-05-29 | Discharge: 2020-05-29 | Disposition: A | Discharge status: Home / Self Care | Payer: Medicaid Other | Attending: Emergency Medicine | Admitting: Emergency Medicine

## 2020-05-29 ENCOUNTER — Encounter (HOSPITAL_BASED_OUTPATIENT_CLINIC_OR_DEPARTMENT_OTHER): Payer: Self-pay | Admitting: Emergency Medicine

## 2020-05-29 ENCOUNTER — Emergency Department (HOSPITAL_BASED_OUTPATIENT_CLINIC_OR_DEPARTMENT_OTHER): Payer: Medicaid Other

## 2020-05-29 DIAGNOSIS — Z87891 Personal history of nicotine dependence: Secondary | ICD-10-CM | POA: Diagnosis not present

## 2020-05-29 DIAGNOSIS — Z794 Long term (current) use of insulin: Secondary | ICD-10-CM | POA: Insufficient documentation

## 2020-05-29 DIAGNOSIS — Z20822 Contact with and (suspected) exposure to covid-19: Secondary | ICD-10-CM | POA: Diagnosis not present

## 2020-05-29 DIAGNOSIS — R0602 Shortness of breath: Secondary | ICD-10-CM | POA: Insufficient documentation

## 2020-05-29 DIAGNOSIS — J4541 Moderate persistent asthma with (acute) exacerbation: Secondary | ICD-10-CM | POA: Diagnosis not present

## 2020-05-29 DIAGNOSIS — R072 Precordial pain: Secondary | ICD-10-CM | POA: Diagnosis not present

## 2020-05-29 DIAGNOSIS — E1140 Type 2 diabetes mellitus with diabetic neuropathy, unspecified: Secondary | ICD-10-CM | POA: Diagnosis not present

## 2020-05-29 DIAGNOSIS — R079 Chest pain, unspecified: Secondary | ICD-10-CM | POA: Diagnosis not present

## 2020-05-29 DIAGNOSIS — I10 Essential (primary) hypertension: Secondary | ICD-10-CM | POA: Diagnosis not present

## 2020-05-29 DIAGNOSIS — E1165 Type 2 diabetes mellitus with hyperglycemia: Secondary | ICD-10-CM | POA: Insufficient documentation

## 2020-05-29 HISTORY — DX: Supraventricular tachycardia: I47.1

## 2020-05-29 HISTORY — DX: Supraventricular tachycardia, unspecified: I47.10

## 2020-05-29 LAB — CBC WITH DIFFERENTIAL/PLATELET
Abs Immature Granulocytes: 0.07 10*3/uL (ref 0.00–0.07)
Basophils Absolute: 0.1 10*3/uL (ref 0.0–0.1)
Basophils Relative: 1 %
Eosinophils Absolute: 0.4 10*3/uL (ref 0.0–0.5)
Eosinophils Relative: 3 %
HCT: 37.6 % (ref 36.0–46.0)
Hemoglobin: 11.7 g/dL — ABNORMAL LOW (ref 12.0–15.0)
Immature Granulocytes: 1 %
Lymphocytes Relative: 38 %
Lymphs Abs: 4 10*3/uL (ref 0.7–4.0)
MCH: 28.6 pg (ref 26.0–34.0)
MCHC: 31.1 g/dL (ref 30.0–36.0)
MCV: 91.9 fL (ref 80.0–100.0)
Monocytes Absolute: 0.6 10*3/uL (ref 0.1–1.0)
Monocytes Relative: 6 %
Neutro Abs: 5.7 10*3/uL (ref 1.7–7.7)
Neutrophils Relative %: 51 %
Platelets: 343 10*3/uL (ref 150–400)
RBC: 4.09 MIL/uL (ref 3.87–5.11)
RDW: 12.9 % (ref 11.5–15.5)
WBC: 10.8 10*3/uL — ABNORMAL HIGH (ref 4.0–10.5)
nRBC: 0 % (ref 0.0–0.2)

## 2020-05-29 LAB — BASIC METABOLIC PANEL
Anion gap: 11 (ref 5–15)
BUN: 26 mg/dL — ABNORMAL HIGH (ref 6–20)
CO2: 27 mmol/L (ref 22–32)
Calcium: 9.3 mg/dL (ref 8.9–10.3)
Chloride: 102 mmol/L (ref 98–111)
Creatinine, Ser: 0.76 mg/dL (ref 0.44–1.00)
GFR calc Af Amer: >60 mL/min (ref >60)
GFR calc non Af Amer: >60 mL/min (ref >60)
Glucose, Bld: 281 mg/dL — ABNORMAL HIGH (ref 70–99)
Potassium: 3.6 mmol/L (ref 3.5–5.1)
Sodium: 140 mmol/L (ref 135–145)

## 2020-05-29 LAB — SARS CORONAVIRUS 2 BY RT PCR (HOSPITAL ORDER, PERFORMED IN ~~LOC~~ HOSPITAL LAB): SARS Coronavirus 2: NEGATIVE

## 2020-05-29 LAB — TROPONIN I (HIGH SENSITIVITY)
Troponin I (High Sensitivity): 13 ng/L (ref <18)
Troponin I (High Sensitivity): 13 ng/L (ref <18)

## 2020-05-29 MED ORDER — IPRATROPIUM-ALBUTEROL 0.5-2.5 (3) MG/3ML IN SOLN
3.0000 mL | Freq: Once | RESPIRATORY_TRACT | Status: AC
  Administered 2020-05-29: 3 mL via RESPIRATORY_TRACT

## 2020-05-29 MED ORDER — IPRATROPIUM-ALBUTEROL 0.5-2.5 (3) MG/3ML IN SOLN
RESPIRATORY_TRACT | Status: AC
  Administered 2020-05-29: 3 mL
  Filled 2020-05-29: qty 3

## 2020-05-29 MED ORDER — ASPIRIN 81 MG PO CHEW
324.0000 mg | CHEWABLE_TABLET | Freq: Once | ORAL | Status: AC
  Administered 2020-05-29: 324 mg via ORAL
  Filled 2020-05-29: qty 4

## 2020-05-29 MED ORDER — DEXAMETHASONE SODIUM PHOSPHATE 10 MG/ML IJ SOLN
10.0000 mg | Freq: Once | INTRAMUSCULAR | Status: AC
  Administered 2020-05-29: 10 mg via INTRAVENOUS
  Filled 2020-05-29: qty 1

## 2020-05-29 MED ORDER — ALBUTEROL SULFATE (2.5 MG/3ML) 0.083% IN NEBU
2.5000 mg | INHALATION_SOLUTION | Freq: Once | RESPIRATORY_TRACT | Status: AC
  Administered 2020-05-29: 2.5 mg via RESPIRATORY_TRACT

## 2020-05-29 MED ORDER — ALBUTEROL SULFATE (2.5 MG/3ML) 0.083% IN NEBU
INHALATION_SOLUTION | RESPIRATORY_TRACT | Status: AC
  Administered 2020-05-29: 2.5 mg
  Filled 2020-05-29: qty 3

## 2020-05-29 MED ORDER — MAGNESIUM SULFATE 2 GM/50ML IV SOLN
2.0000 g | Freq: Once | INTRAVENOUS | Status: AC
  Administered 2020-05-29: 2 g via INTRAVENOUS
  Filled 2020-05-29: qty 50

## 2020-05-29 MED ORDER — FENTANYL CITRATE (PF) 100 MCG/2ML IJ SOLN
100.0000 ug | Freq: Once | INTRAMUSCULAR | Status: AC
  Administered 2020-05-29: 100 ug via INTRAVENOUS
  Filled 2020-05-29: qty 2

## 2020-05-29 NOTE — Discharge Instructions (Signed)
Use your inhaler every 4 hours(6 puffs) while awake, return for sudden worsening shortness of breath, or if you need to use your inhaler more often.  ° °

## 2020-05-29 NOTE — ED Triage Notes (Signed)
Patient presents with complaints of SOB; states woke her up this am; complains of midsternal chest pain; onset this am

## 2020-05-29 NOTE — ED Provider Notes (Signed)
I received the patient in signout from Dr. Florina Ou.  Briefly the patient is a 43 year old female with a chief complaint of shortness of breath.  Is been an ongoing issue for her.  She was just seen in the ED 2 days ago for the same.  At that time she had a delta troponin and a CT angiogram of the chest that was negative.  Plan today for delta troponin and nebulizer treatments if her tests are negative and her breathing is improved plan for discharge home.  Patient subjectively feels better.  Troponins are negative.  Covid test is negative.  Will discharge the patient.  PCP follow-up.   Deno Etienne, DO 05/29/20 [phone]

## 2020-05-29 NOTE — ED Notes (Signed)
EKG done

## 2020-05-29 NOTE — ED Notes (Signed)
Patient complains of more SOB and decreased SAT. When I came in, SAT 89%. Instructed to take deep breaths and SAT increased to 92%. Patient asked for o2, stated they placed her on some when she got here and it made her feel a bit better. MD aware.

## 2020-05-29 NOTE — ED Provider Notes (Signed)
[facility] DEPT [facility] Provider Note: Belinda Spurling, MD, FACEP  CSN: [id number] MRN: [id number] ARRIVAL: 05/29/20 at 0417 ROOM: MH04/MH04   CHIEF COMPLAINT  Shortness of Breath   HISTORY OF PRESENT ILLNESS  05/29/20 4:59 AM [name] is a 44 y.o. female with a 2-day history of shortness of breath.  She is on albuterol at home for what she thinks is asthma.  She denies a history of COPD but is a former smoker.  Her shortness of breath is moderate to severe this month morning and she she is unable to lie flat..  Symptoms have not been relieved with albuterol.  She has associated chest pain which she describes as a tightness tightness; she rates it as an 8 out of 10.  It is not worse with deep breathing or coughing.  She was seen at [facility] on 05/27/2020 for supraventricular tachycardia.  A CT angio chest at that time was unremarkable.  The patient has chronic pain and receives 100, 10/325 hydrocodone/APAP tablets every 25 days.   Past Medical History:  Diagnosis Date   Acute osteomyelitis of ankle or foot, left ([place]) 01/31/2018   Alveolar hypoventilation    Anemia    not on iron pill   Asthma    Bipolar 2 disorder ([name])    Carpal tunnel syndrome on right    recurrent   Cellulitis 08/2010-08/2011   Chronic pain    COPD (chronic obstructive pulmonary disease) ([name])    Symbicort daily and Proventil as needed   Costochondritis    Depression    Diabetes mellitus type II, uncontrolled ([place]) 2000   Type 2, Uncontrolled.Takes Lantus daily.Fasting blood sugar runs 150   Dizziness    occasionally   Drug-seeking behavior    GERD (gastroesophageal reflux disease)    takes Pantoprazole and Zantac daily   Headache    migraine-last one about a yr ago.Topamax daily   HLD (hyperlipidemia)    takes Atorvastatin daily   Hypertension    takes Lisinopril and Coreg daily   Morbid obesity ([name])    Muscle spasm    takes Flexeril as needed   Nocturia     OSA on CPAP    Peripheral neuropathy    takes Gabapentin daily   Pneumonia    "walking" several yrs ago and as a baby (12/05/2018)   Rectal fissure    Restless leg    SVT (supraventricular tachycardia) ([name])    Syncope 02/25/2016   Urinary frequency    Varicose veins    Right medial thigh and Left leg     Past Surgical History:  Procedure Laterality Date   AMPUTATION Left 02/01/2018   Procedure: LEFT FOURTH AND 5TH TOE RAY AMPUTATION;  Surgeon: [name], MD;  Location: [place];  Service: Orthopedics;  Laterality: Left;   AMPUTATION Left 03/03/2018   Procedure: LEFT BELOW KNEE AMPUTATION;  Surgeon: [name], MD;  Location: [place];  Service: Orthopedics;  Laterality: Left;   CARPAL TUNNEL RELEASE Bilateral    CESAREAN SECTION  2007   INCISION AND DRAINAGE PERIRECTAL ABSCESS Left 05/18/2019   Procedure: IRRIGATION AND DEBRIDEMENT OF PANNIS ABSCESS, POSSIBLE DEBRIDEMENT OF BUTTOCK WOUND;  Surgeon: Donnie Mesa, MD;  Location: [place];  Service: General;  Laterality: Left;   IRRIGATION AND DEBRIDEMENT BUTTOCKS Left 05/17/2019   Procedure: IRRIGATION AND DEBRIDEMENT BUTTOCKS;  Surgeon: Donnie Mesa, MD;  Location: [place];  Service: General;  Laterality: Left;   KNEE ARTHROSCOPY Right 07/17/2010   LEFT  HEART CATHETERIZATION WITH CORONARY ANGIOGRAM N/A 07/27/2012   Procedure: LEFT HEART CATHETERIZATION WITH CORONARY ANGIOGRAM;  Surgeon: Sherren Mocha, MD;  Location: [facility] CATH LAB;  Service: Cardiovascular;  Laterality: N/A;   MASS EXCISION N/A 06/29/2013   Procedure:  WIDE LOCAL EXCISION OF POSTERIOR NECK ABSCESS;  Surgeon: Ralene Ok, MD;  Location: [place];  Service: General;  Laterality: N/A;   REPAIR KNEE LIGAMENT Left    "fixed ligaments and chipped patella"   right transmetatarsal amputation       Family History  Problem Relation Age of Onset   Diabetes Mother    Hyperlipidemia Mother    Depression Mother    GER disease Mother    Allergic rhinitis  Mother    Restless legs syndrome Mother    Heart attack Paternal Uncle    Heart disease Paternal Grandmother    Heart attack Paternal Grandmother    Heart attack Paternal Grandfather    Heart disease Paternal Grandfather    Heart attack Father    Migraines Sister    Cancer Maternal Grandmother        COLON   Hypertension Maternal Grandmother    Hyperlipidemia Maternal Grandmother    Diabetes Maternal Grandmother    Other Maternal Grandfather        GUN SHOT   Anxiety disorder Sister     Social History   Tobacco Use   Smoking status: Former Smoker    Packs/day: 0.30    Years: 0.30    Pack years: 0.09    Types: Cigarettes    Quit date: 12/06/1993    Years since quitting: 26.4   Smokeless tobacco: Never Used  Vaping Use   Vaping Use: Never used  Substance Use Topics   Alcohol use: No    Alcohol/week: 0.0 standard drinks   Drug use: Yes    Comment: OD attempts on home meds      Prior to Admission medications   Medication Sig Start Date End Date Taking? Authorizing Provider  Accu-Chek Softclix Lancets lancets Use to check blood glucose four times daily 04/17/20   Martyn Malay, MD  amLODipine (NORVASC) 10 MG tablet Take 1 tablet (10 mg total) by mouth daily. 05/25/19   [name], MD  atorvastatin (LIPITOR) 40 MG tablet Take 1 tablet (40 mg total) by mouth daily. 04/11/20   [name], MD  budesonide-formoterol [facility]) 160-4.5 MCG/ACT inhaler Inhale 2 puffs into the lungs 2 (two) times daily. 05/09/20   [name], MD  cetirizine (ZYRTEC) 10 MG tablet Take 10 mg by mouth daily as needed for allergies.    [provider]  Cholecalciferol 1000 units tablet Take 1 tablet (1,000 Units total) by mouth daily. 04/05/17   [name], MD  Continuous Blood Gluc Sensor (DEXCOM G6 SENSOR) MISC Inject 1 applicator into the skin as directed. Change sensor every 10 days. 04/10/20   Leavy Cella, RPH-CPP  Continuous Blood Gluc  Transmit (DEXCOM G6 TRANSMITTER) MISC Inject 1 Device into the skin as directed. Reuse 8 times with sensor changes. 04/10/20   Leavy Cella, RPH-CPP  cyclobenzaprine (FLEXERIL) 5 MG tablet TAKE 1 TABLET BY MOUTH 3 TIMES DAILY AS NEEDED FOR MUSCLE SPASMS 04/11/20   [name], MD  gabapentin (NEURONTIN) 600 MG tablet TAKE 2 TABLETS BY MOUTH 3 TIMES DAILY 05/09/20   [name], MD  glucose blood (ACCU-CHEK GUIDE) test strip USE T0 CHECK BLOOD SUGAR 3 TIMES DAILY 04/18/20   Leavy Cella,  RPH-CPP  HYDROcodone-acetaminophen (NORCO) 10-325 MG tablet Take one tablet every 6 to 8 hours as needed. 05/13/20   [name], MD  insulin isophane & regular human (HUMULIN 70/30 MIX) (70-30) 100 UNIT/ML KwikPen Inject 100 Units into the skin daily with breakfast AND 60 Units daily with supper. 05/16/20   [name], MD  Insulin Pen Needle (SURE COMFORT PEN NEEDLES) 32G X 4 MM MISC Use to inject insulin as indicated 04/17/20   Martyn Malay, MD  Lancets (ACCU-CHEK MULTICLIX) lancets 1 each by Other route as needed for other. Use as instructed 04/18/20   Leavy Cella, RPH-CPP  metoprolol succinate (TOPROL XL) 25 MG 24 hr tablet Take 1 tablet (25 mg total) by mouth daily. 05/28/20 05/23/21  Sherren Mocha, MD  ondansetron (ZOFRAN ODT) 4 MG disintegrating tablet Take 1 tablet (4 mg total) by mouth every 8 (eight) hours as needed for nausea or vomiting. 03/26/20   Albrizze, Verline Lema E, PA-C  pantoprazole (PROTONIX) 40 MG tablet Take 1 tablet (40 mg total) by mouth daily. 03/16/18   Angiulli, Lavon Paganini, PA-C  PROVENTIL HFA 108 (90 Base) MCG/ACT inhaler Inhale 1-2 puffs into the lungs every 6 (six) hours as needed for wheezing or shortness of breath. Patient taking differently: Inhale 2 puffs into the lungs every 4 (four) hours as needed for wheezing.  05/11/19   [name], MD  rOPINIRole (REQUIP) 1 MG tablet Take 1 tablet (1 mg total) by mouth at bedtime. 04/03/20   [name], MD   terconazole (TERAZOL 7) 0.4 % vaginal cream Place 1 applicator vaginally at bedtime. 04/03/20   [name], MD  VICTOZA 18 MG/3ML SOPN inject 0.6 MG SUBCUTANEOUSLY ONCE A DAY FOR 1 WEEK, THEN INCREASE TO 1.2 MG ONCE daily max 1.8 MG 04/16/20   [name], MD    Allergies Cefepime, Kiwi extract, Morphine and related, Trental [pentoxifylline], and Nubain [nalbuphine hcl]   REVIEW OF SYSTEMS  Negative except as noted here or in the History of Present Illness.   PHYSICAL EXAMINATION  Initial Vital Signs Blood pressure (!) 160/97, pulse (!) 105, temperature 98.4 F (36.9 C), temperature source Oral, resp. rate 20, height 5\' 2"  (1.575 m), weight (!) 163.3 kg, last menstrual period 05/15/2020, SpO2 95 %.  Examination General: Well-developed, obese female in no acute distress; appearance consistent with age of record HENT: normocephalic; atraumatic Eyes: pupils equal, round and reactive to light; extraocular muscles intact Neck: supple Heart: regular rate and rhythm; tachycardia Lungs: Diminished breath sounds bilaterally with faint wheezing heard in the upper chest Abdomen: soft; obese; nontender; bowel sounds present Extremities: Left BKA with prosthesis; surgical absence of right toes Neurologic: Awake, alert and oriented; motor function intact in all extremities and symmetric; no facial droop Skin: Warm and dry Psychiatric: Anxious   RESULTS  Summary of this visit's results, reviewed and interpreted by myself:   EKG Interpretation  Date/Time:  Thursday May 29 2020 04:48:40 EDT Ventricular Rate:  105 PR Interval:    QRS Duration: 103 QT Interval:  357 QTC Calculation: 472 R Axis:   103 Text Interpretation: Sinus tachycardia Right axis deviation Low voltage, precordial leads Rate is faster Confirmed by Onia Shiflett [phone]) on 05/29/2020 5:00:10 AM      Laboratory Studies: Results for orders placed or performed during the hospital encounter of 05/29/20  (from the past 24 hour(s))  CBC with Differential/Platelet     Status: Abnormal   Collection Time: 05/29/20  6:27 AM  Result Value Ref Range   WBC 10.8 (H) 4.0 - 10.5 K/uL   RBC 4.09 3.87 - 5.11 MIL/uL   Hemoglobin 11.7 (L) 12.0 - 15.0 g/dL   HCT 37.6 36 - 46 %   MCV 91.9 80.0 - 100.0 fL   MCH 28.6 26.0 - 34.0 pg   MCHC 31.1 30.0 - 36.0 g/dL   RDW 12.9 11.5 - 15.5 %   Platelets 343 150 - 400 K/uL   nRBC 0.0 0.0 - 0.2 %   Neutrophils Relative % 51 %   Neutro Abs 5.7 1.7 - 7.7 K/uL   Lymphocytes Relative 38 %   Lymphs Abs 4.0 0.7 - 4.0 K/uL   Monocytes Relative 6 %   Monocytes Absolute 0.6 0 - 1 K/uL   Eosinophils Relative 3 %   Eosinophils Absolute 0.4 0 - 0 K/uL   Basophils Relative 1 %   Basophils Absolute 0.1 0 - 0 K/uL   Immature Granulocytes 1 %   Abs Immature Granulocytes 0.07 0.00 - 0.07 K/uL  Basic metabolic panel     Status: Abnormal   Collection Time: 05/29/20  6:27 AM  Result Value Ref Range   Sodium 140 135 - 145 mmol/L   Potassium 3.6 3.5 - 5.1 mmol/L   Chloride 102 98 - 111 mmol/L   CO2 27 22 - 32 mmol/L   Glucose, Bld 281 (H) 70 - 99 mg/dL   BUN 26 (H) 6 - 20 mg/dL   Creatinine, Ser 0.76 0.44 - 1.00 mg/dL   Calcium 9.3 8.9 - 10.3 mg/dL   GFR calc non Af Amer >60 >60 mL/min   GFR calc Af Amer >60 >60 mL/min   Anion gap 11 5 - 15  Troponin I (High Sensitivity)     Status: None   Collection Time: 05/29/20  6:27 AM  Result Value Ref Range   Troponin I (High Sensitivity) 13 <18 ng/L  SARS Coronavirus 2 by RT PCR (hospital order, performed in [facility] hospital lab) Nasopharyngeal Nasopharyngeal Swab     Status: None   Collection Time: 05/29/20  7:42 AM   Specimen: Nasopharyngeal Swab  Result Value Ref Range   SARS Coronavirus 2 NEGATIVE NEGATIVE  Troponin I (High Sensitivity)     Status: None   Collection Time: 05/29/20  8:45 AM  Result Value Ref Range   Troponin I (High Sensitivity) 13 <18 ng/L   *Note: Due to a large number of results and/or  encounters for the requested time period, some results have not been displayed. A complete set of results can be found in Results Review.   Imaging Studies: DG Chest 2 View  Result Date: 05/27/2020 CLINICAL DATA:  Chest pain and shortness of breath. EXAM: CHEST - 2 VIEW COMPARISON:  04/14/2020 FINDINGS: The heart size and mediastinal contours are within normal limits. Both lungs are clear. The visualized skeletal structures are unremarkable. IMPRESSION: No active cardiopulmonary disease. Electronically Signed   By: Lorriane Shire M.D.   On: 05/27/2020 10:37   CT Angio Chest PE W and/or Wo Contrast  Result Date: 05/27/2020 CLINICAL DATA:  Chest pain and shortness of breath. EXAM: CT ANGIOGRAPHY CHEST WITH CONTRAST TECHNIQUE: Multidetector CT imaging of the chest was performed using the standard protocol during bolus administration of intravenous contrast. Multiplanar CT image reconstructions and MIPs were obtained to evaluate the vascular anatomy. CONTRAST:  156mL OMNIPAQUE IOHEXOL 350 MG/ML SOLN COMPARISON:  June 27, 2012 FINDINGS: Cardiovascular: Satisfactory opacification of the pulmonary arteries to the segmental  level. No evidence of pulmonary embolism. Normal heart size. No pericardial effusion. Mediastinum/Nodes: No enlarged mediastinal, hilar, or axillary lymph nodes. Thyroid gland, trachea, and esophagus demonstrate no significant findings. Lungs/Pleura: Lungs are clear. No pleural effusion or pneumothorax. Upper Abdomen: No acute abnormality. Musculoskeletal: No chest wall abnormality. No acute or significant osseous findings. Review of the MIP images confirms the above findings. IMPRESSION: Negative examination for pulmonary embolism or acute cardiopulmonary disease. Electronically Signed   By: Virgina Norfolk M.D.   On: 05/27/2020 21:04   DG Chest Port 1 View  Result Date: 05/29/2020 CLINICAL DATA:  Shortness of breath.  Midsternal chest pain. EXAM: PORTABLE CHEST 1 VIEW COMPARISON:   Two-view chest x-ray 05/27/2020 FINDINGS: The heart size and hila are exaggerated by low lung volumes. No edema or effusion is present. No focal airspace disease is evident. Degenerative changes are noted at the shoulders. IMPRESSION: 1. Low lung volumes. 2. No acute cardiopulmonary disease. Electronically Signed   By: San Morelle M.D.   On: 05/29/2020 05:30    ED COURSE and MDM  Nursing notes, initial and subsequent vitals signs, including pulse oximetry, reviewed and interpreted by myself.  Vitals:   05/29/20 0800 05/29/20 0830 05/29/20 0900 05/29/20 1020  BP: 118/72 133/85 138/90 122/73  Pulse: (!) 101 (!) 103 (!) 105 (!) 106  Resp: 16 14 16    Temp:      TempSrc:      SpO2: 95% 97% 97% 92%  Weight:      Height:       Medications  albuterol (PROVENTIL) (2.5 MG/3ML) 0.083% nebulizer solution (2.5 mg  Given 05/29/20 0458)  ipratropium-albuterol (DUONEB) 0.5-2.5 (3) MG/3ML nebulizer solution (3 mLs  Given 05/29/20 0459)  albuterol (PROVENTIL) (2.5 MG/3ML) 0.083% nebulizer solution 2.5 mg (2.5 mg Nebulization Given 05/29/20 0459)  ipratropium-albuterol (DUONEB) 0.5-2.5 (3) MG/3ML nebulizer solution 3 mL (3 mLs Nebulization Given 05/29/20 0458)  dexamethasone (DECADRON) injection 10 mg (10 mg Intravenous Given 05/29/20 0629)  magnesium sulfate IVPB 2 g 50 mL ( Intravenous Stopped 05/29/20 0733)  fentaNYL (SUBLIMAZE) injection 100 mcg (100 mcg Intravenous Given 05/29/20 6761)  aspirin chewable tablet 324 mg (324 mg Oral Given 05/29/20 0638)   5:06 AM Albuterol and Atrovent treatment initiated.  6:31 AM Patient feels some improvement after albuterol and Atrovent treatment.  She is complaining of persistent chest discomfort and is requesting something for pain.  EKG is not showing ischemic changes.  Troponin pending.   PROCEDURES  Procedures   ED DIAGNOSES     ICD-10-CM   1. SOB (shortness of breath)  R06.02   2. Moderate persistent reactive airway disease with acute exacerbation   J45.41        Sequoyah Ramone, MD 05/29/20 2238

## 2020-05-30 DIAGNOSIS — E1140 Type 2 diabetes mellitus with diabetic neuropathy, unspecified: Secondary | ICD-10-CM | POA: Diagnosis not present

## 2020-05-31 DIAGNOSIS — E1140 Type 2 diabetes mellitus with diabetic neuropathy, unspecified: Secondary | ICD-10-CM | POA: Diagnosis not present

## 2020-06-02 DIAGNOSIS — E1140 Type 2 diabetes mellitus with diabetic neuropathy, unspecified: Secondary | ICD-10-CM | POA: Diagnosis not present

## 2020-06-03 DIAGNOSIS — E1140 Type 2 diabetes mellitus with diabetic neuropathy, unspecified: Secondary | ICD-10-CM | POA: Diagnosis not present

## 2020-06-04 ENCOUNTER — Other Ambulatory Visit: Payer: Self-pay

## 2020-06-04 ENCOUNTER — Encounter: Payer: Self-pay | Admitting: Physical Therapy

## 2020-06-04 ENCOUNTER — Ambulatory Visit: Payer: Medicaid Other | Admitting: Physical Therapy

## 2020-06-04 VITALS — BP 120/83 | HR 87

## 2020-06-04 DIAGNOSIS — M21371 Foot drop, right foot: Secondary | ICD-10-CM | POA: Diagnosis not present

## 2020-06-04 DIAGNOSIS — R293 Abnormal posture: Secondary | ICD-10-CM | POA: Diagnosis not present

## 2020-06-04 DIAGNOSIS — M79604 Pain in right leg: Secondary | ICD-10-CM | POA: Diagnosis not present

## 2020-06-04 DIAGNOSIS — M25661 Stiffness of right knee, not elsewhere classified: Secondary | ICD-10-CM

## 2020-06-04 DIAGNOSIS — R531 Weakness: Secondary | ICD-10-CM | POA: Diagnosis not present

## 2020-06-04 DIAGNOSIS — Z9181 History of falling: Secondary | ICD-10-CM

## 2020-06-04 DIAGNOSIS — M25651 Stiffness of right hip, not elsewhere classified: Secondary | ICD-10-CM

## 2020-06-04 DIAGNOSIS — R2681 Unsteadiness on feet: Secondary | ICD-10-CM | POA: Diagnosis not present

## 2020-06-04 DIAGNOSIS — M25652 Stiffness of left hip, not elsewhere classified: Secondary | ICD-10-CM | POA: Diagnosis not present

## 2020-06-04 DIAGNOSIS — E1140 Type 2 diabetes mellitus with diabetic neuropathy, unspecified: Secondary | ICD-10-CM | POA: Diagnosis not present

## 2020-06-04 DIAGNOSIS — R2689 Other abnormalities of gait and mobility: Secondary | ICD-10-CM

## 2020-06-04 DIAGNOSIS — Z7689 Persons encountering health services in other specified circumstances: Secondary | ICD-10-CM | POA: Diagnosis not present

## 2020-06-04 NOTE — Therapy (Signed)
[facility] [address] [place], [place], [address] Phone: [phone]   Fax:  [phone]  Physical Therapy Treatment  Patient Details  Name: [name] MRN: [id number] Date of Birth: Jan 01, 1976 Referring Provider (PT): [name], MD   Encounter Date: 06/04/2020   PT End of Session - 06/04/20 1011    Visit Number 14    Number of Visits 21    Authorization Type Medicaid    Authorization Time Period 10 visits 05/07/2020 - 07/15/2020    Authorization - Visit Number 2    Authorization - Number of Visits 10    PT Start Time 1015    PT Stop Time 1100    PT Time Calculation (min) 45 min    Equipment Utilized During Treatment Gait belt    Activity Tolerance Patient tolerated treatment well;Patient limited by fatigue    Behavior During Therapy [facility] for tasks assessed/performed           Past Medical History:  Diagnosis Date   Acute osteomyelitis of ankle or foot, left ([place]) 01/31/2018   Alveolar hypoventilation    Anemia    not on iron pill   Asthma    Bipolar 2 disorder ([name])    Carpal tunnel syndrome on right    recurrent   Cellulitis 08/2010-08/2011   Chronic pain    COPD (chronic obstructive pulmonary disease) ([name])    Symbicort daily and Proventil as needed   Costochondritis    Depression    Diabetes mellitus type II, uncontrolled ([place]) 2000   Type 2, Uncontrolled.Takes Lantus daily.Fasting blood sugar runs 150   Dizziness    occasionally   Drug-seeking behavior    GERD (gastroesophageal reflux disease)    takes Pantoprazole and Zantac daily   Headache    migraine-last one about a yr ago.Topamax daily   HLD (hyperlipidemia)    takes Atorvastatin daily   Hypertension    takes Lisinopril and Coreg daily   Morbid obesity ([name])    Muscle spasm    takes Flexeril as needed   Nocturia    OSA on CPAP    Peripheral neuropathy    takes Gabapentin daily   Pneumonia     "walking" several yrs ago and as a baby (12/05/2018)   Rectal fissure    Restless leg    SVT (supraventricular tachycardia) ([name])    Syncope 02/25/2016   Urinary frequency    Varicose veins    Right medial thigh and Left leg     Past Surgical History:  Procedure Laterality Date   AMPUTATION Left 02/01/2018   Procedure: LEFT FOURTH AND 5TH TOE RAY AMPUTATION;  Surgeon: [name], MD;  Location: [facility];  Service: Orthopedics;  Laterality: Left;   AMPUTATION Left 03/03/2018   Procedure: LEFT BELOW KNEE AMPUTATION;  Surgeon: [name], MD;  Location: [facility];  Service: Orthopedics;  Laterality: Left;   CARPAL TUNNEL RELEASE Bilateral    CESAREAN SECTION  2007   INCISION AND DRAINAGE PERIRECTAL ABSCESS Left 05/18/2019   Procedure: IRRIGATION AND DEBRIDEMENT OF PANNIS ABSCESS, POSSIBLE DEBRIDEMENT OF BUTTOCK WOUND;  Surgeon: Donnie Mesa, MD;  Location: [facility];  Service: General;  Laterality: Left;   IRRIGATION AND DEBRIDEMENT BUTTOCKS Left 05/17/2019   Procedure: IRRIGATION AND DEBRIDEMENT BUTTOCKS;  Surgeon: Donnie Mesa, MD;  Location: [facility];  Service: General;  Laterality: Left;   KNEE ARTHROSCOPY Right 07/17/2010   LEFT HEART CATHETERIZATION WITH CORONARY ANGIOGRAM N/A 07/27/2012   Procedure: LEFT  HEART CATHETERIZATION WITH CORONARY ANGIOGRAM;  Surgeon: Sherren Mocha, MD;  Location: [facility] CATH LAB;  Service: Cardiovascular;  Laterality: N/A;   MASS EXCISION N/A 06/29/2013   Procedure:  WIDE LOCAL EXCISION OF POSTERIOR NECK ABSCESS;  Surgeon: Ralene Ok, MD;  Location: [facility];  Service: General;  Laterality: N/A;   REPAIR KNEE LIGAMENT Left    "fixed ligaments and chipped patella"   right transmetatarsal amputation       Vitals:   06/04/20 1012  BP: 120/83  Pulse: 87  SpO2: 94%     Subjective Assessment - 06/04/20 1012    Subjective She went to ED 1 week ago and had SVT. She has had SOB sitting and increases with any activity including rolling in bed. She  has blisters on left residual limb that started Saturday (4 days ago). She stopped wearing prosthesis since blisters. She wears AFO when leaving house mainly. She does not wear shoes around house most of time.    Pertinent History Rt Transmetatarsal Amputation, LTTA, OA, COPD, asthma, uncontrolled DM2, depression, Neuropathy, Bipolar, carpal tunnel, morbid obesity, syncope    Limitations Lifting;Standing;Walking;House hold activities    Patient Stated Goals To walk in house & community    Currently in Pain? Yes    Pain Score 5    in last week, worst 10/10, best 2-3/10   Pain Location Leg    Pain Orientation Right;Left    Pain Descriptors / Indicators Aching;Burning    Pain Type Chronic pain    Pain Onset More than a month ago    Pain Frequency Constant    Aggravating Factors  When wears prosthesis she is more active.    Pain Relieving Factors resting,                             [facility] Adult PT Treatment/Exercise - 06/04/20 1015      Transfers   Transfers Sit to Stand;Stand to Sit    Sit to Stand 5: Supervision;With upper extremity assist;From chair/3-in-1   to RW   Stand to Sit 5: Supervision;With upper extremity assist;To chair/3-in-1   from [employer] Transfers --      Ambulation/Gait   Ambulation/Gait Yes    Ambulation/Gait Assistance 4: Min guard    Ambulation/Gait Assistance Details verbal cues on upright posture & wt shift    Ambulation Distance (Feet) 50 Feet    Assistive device Rolling walker;Prosthesis;Other (Comment)   right AFO   Gait Pattern Step-to pattern;Decreased stride length;Decreased dorsiflexion - right;Decreased weight shift to left;Right foot flat;Antalgic;Lateral hip instability;Trunk flexed;Decreased step length - right;Decreased stance time - left;Right flexed knee in stance;Left flexed knee in stance    Ambulation Surface Level;Indoor    Gait Comments after rest before gait Sp)2 100% HR 88 respirations 16 Dyspnea 2/4 after gait SpO2  91% HR 102 resp 28 dyspnea 3+/4  took 3 minutes for SpO2 >95% and 5 minutes for HR 91      Self-Care   ADL's If using mat on floor in front of couch as her feet slide when arising, use an antislip with high color contrast.  Pt verbalized understanding.       Therapeutic Activites    Other Therapeutic Activities PT recommended wearing AFO & shoes when out of bed.       Neuro Re-ed    Neuro Re-ed Details  standing with RW: single UE looking over shoulders with weight shift & trunk  rotation 5 reps right & 5 reps left, with BUE support back extension looking up & trunk flexion looking down to feet 5 reps.  Total standing time 3 minutes with SpO2 96% HR 94 respirations 22 Dyspnea 3/4 with 2 minutes to recover to baseline      Prosthetics   Prosthetic Care Comments  2cm blister on distal medial Transmetatarsal Amputation with black dry scab but appears to be healing slowly.  PT recommended drying limb & liner every 4 hours with prosthesis wear and performing while toileting if wearing long pants.      Current prosthetic wear tolerance (days/week)  was daily prior to 6/26 & no wear for 4 days due to blister except to get out of her bedroom which she has to walk as w/c will not fit thru door.     Current prosthetic wear tolerance (#hours/day)  prosthesis when out of bed which is 4-8 hrs/day and increased to 12 hours on Saturday when out of town & got blister.     Current prosthetic weight-bearing tolerance (hours/day)  3 minutes standing & 5 minutes gait. limited by cardiopulmonary issues not pain or discomfort.     Residual limb condition  left Transtibial residual limb with 50mm blister with minimal fluid but no signs of infection.      Education Provided Residual limb care;Care of non-amputated limb;Proper wear schedule/adjustment;Proper weight-bearing schedule/adjustment;Other (comment)   see prosthetic care comments   Person(s) Educated Patient;Child(ren)    Education Method Explanation;Verbal cues      Education Method Verbalized understanding;Verbal cues required;Needs further instruction                    PT Short Term Goals - 06/04/20 1252      PT SHORT TERM GOAL #1   Title Patient tolerates wear of AFO >50% of awake hours without negative impact on skin integrity (negative impact on wound). (All STGs Target Date: 06/19/2020)    Baseline 04/15/20: pt is wearing liner >/= 90% of awake hours (even in bed), wearing prosthesis ~80% of awak hours out of bed ("pops it off at times when sitting to watch TV). She recieved AFO mid April & has worn limited amount. 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 Patient reports wound is from pressure in bed not AFO.    Time 5    Period Weeks    Status On-going    Target Date 06/19/20      PT SHORT TERM GOAL #2   Title Patient standing balance with RW support reaching 5" anteriorly & touching floor with supervision.    Baseline 02/20/20: min guard assist with RW/prosthesis/AFO to static stand for 3 minutes, reach 2-3 inches from floor (improved from 12/05/19 baseline). 04/15/20: unable to reassess this date    Time 5    Period Weeks    Status On-going    Target Date 06/19/20      PT SHORT TERM GOAL #3   Title Patient reports out of bed >8 hrs total / day.    Baseline Patient reports spending majority of her day in bed which leads to further deconditioning & negative impact on standing /gait.    Time 5    Period Weeks    Status On-going    Target Date 06/19/20      PT SHORT TERM GOAL #4   Title Patient ambulates 57' with RW & prosthesis and GrAFO with supervision.  Baseline 02/20/20: min guard to supervision for 37 feet with RW/prosthesis/AFO (improved from 12/04/20 baseline); On 04/02/2020 pt only tolerated 10' gait with minA.  04/15/20 unable to reassess today    Time 5    Period Weeks    Status On-going    Target Date 06/19/20      PT SHORT TERM GOAL #5   Title Patient  negotiates ramp & curb with RW, prosthesis & GrAFO with modA.    Baseline 04/02/20: mod assist with RW/prosthesis/AFO for 4 inch curb negotiation, ramp has not been inititated to date; 04/15/20: unable to assess today    Time 5    Period Weeks    Status On-going    Target Date 06/19/20             PT Long Term Goals - 04/17/20 0832      PT LONG TERM GOAL #1   Title Patient verbalizes & demonstrates proper orthotic care and maintains current mod-I prosthetic care ability to enable safe use of orthosis & prosthesis. (All LTGs Target Date 07/11/2020)    Baseline 04/15/20: wearing 4-5 days a week, for 2 hours each day.  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    Time 10    Period Weeks    Status On-going    Target Date 07/11/20      PT LONG TERM GOAL #2   Title Patient tolerates prosthetic liner & AFO wear for >90% and prosthetic wear >75% of awake hours without skin integrity issues for improved mobility.    Baseline 04/15/20: pt is wearing liner >/= 90% of awake hours (even in bed), wearing prosthesis ~80% of awak hours out of bed ("pops it off at times when sitting to watch TV).  She recieved AFO mid April & has worn limited amount. 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 Patient reports wound is from pressure in bed not AFO.    Time 10    Period Weeks    Status Revised    Target Date 07/11/20      PT LONG TERM GOAL #3   Title Patient reports pain increases </= 3 increments with standing & gait increased activities noted in LTGs with RW, prosthesis, and orthosis.    Baseline 04/15/20: continues to vary depending on how long pt is standing, does report this has overall gotten better    Time 10    Period Weeks    Status On-going      PT LONG TERM GOAL #4   Title Standing balance with RW support reaching 5" anteriorly, picking up objects from floor,  scanning environment & managing pants for toileting safely modified independent.    Baseline 02/20/20: min guard assist with RW/prosthesis/AFO to static stand for 3 minutes, reach 2-3 inches from floor (improved from 12/05/19 baseline). 04/15/20: unable to reassess this date    Time 10    Period Weeks    Status Revised    Target Date 07/11/20      PT LONG TERM GOAL #5   Title Patient ambulates 58' with RW & prosthesis & orthosis modified independent to enable basic community entry.    Baseline 02/20/20: min guard to supervision for 37 feet with RW/prosthesis/AFO (improved from 12/04/20 baseline); 04/15/20 unable to reassess today    Time 10    Period Weeks    Status On-going  Target Date 07/11/20      PT LONG TERM GOAL #6   Title Patient negotiates ramp & curb with RW & prosthesis & orthosis minA from family to enable community access.    Baseline 04/02/20: mod assist with RW/prosthesis/AFO for 4 inch curb negotiation, ramp has not been inititated to date; 04/15/20: unable to assess today    Time 10    Period Weeks    Status On-going    Target Date 07/11/20                 Plan - 06/04/20 1012    Clinical Impression Statement PT reset STGs Target date as patient only seen 2 visits since recert.  Patient with increased dyspnea & HR increases with light standing & gait activities but not to unsafe level. She does require seated rests for 3-5 minutes to recover respiration & HR to safe level. Her oxygen saturation dropped to 91% with gait but again not unsafe level.    Personal Factors and Comorbidities Comorbidity 3+;Fitness;Past/Current Experience;Finances;Time since onset of injury/illness/exacerbation    Comorbidities left Transtibial amputation, right Transmetatarsal amputation, OA, asthma, DM, depression, neuropathy, Bipolar    Examination-Activity Limitations Bed Mobility;Caring for Others;Carry;Lift;Locomotion Level;Stairs;Stand;Transfers    Examination-Participation  Restrictions Community Activity;Driving;Meal Prep;Other   parenting 42yo dtr   Stability/Clinical Decision Making Evolving/Moderate complexity    Rehab Potential Good    PT Frequency 1x / week   1x/wk for 3 weeks, then 2x/wk for 6 weeks   PT Duration Other (comment)   10 weeks   PT Treatment/Interventions ADLs/Self Care Home Management;DME Instruction;Gait training;Stair training;Functional mobility training;Therapeutic activities;Therapeutic exercise;Balance training;Neuromuscular re-education;Patient/family education;Prosthetic Training;Orthotic Fit/Training;Vestibular    PT Next Visit Plan check residual limb that blister is healing & check right TMT wound, work on standing tolerance / balance & gait with prosthesis & AFO towards STGs.    Consulted and Agree with Plan of Care Patient           Patient will benefit from skilled therapeutic intervention in order to improve the following deficits and impairments:  Abnormal gait, Decreased activity tolerance, Decreased balance, Decreased endurance, Decreased knowledge of use of DME, Decreased mobility, Decreased range of motion, Decreased strength, Dizziness, Impaired flexibility, Postural dysfunction, Prosthetic Dependency, Obesity, Pain  Visit Diagnosis: Other abnormalities of gait and mobility  Unsteadiness on feet  Abnormal posture  Weakness generalized  History of fall  Pain in right leg  Stiffness of right hip, not elsewhere classified  Foot drop, right  Stiffness of left hip, not elsewhere classified  Stiffness of right knee, not elsewhere classified     Problem List Patient Active Problem List   Diagnosis Date Noted   Blister of foot 04/21/2020   Exposure to mold 04/21/2020   Constipation 04/03/2020   Status post amputation of toe of right foot ([place]) 02/13/2020   UTI (urinary tract infection) 06/28/2019   Amputation above knee ([name]) 06/23/2019   Left shoulder pain 06/23/2019   Abscess of buttock, right  05/17/2019   Severe protein-calorie malnutrition ([name])    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[place]) 04/11/2019   Type 2 diabetes mellitus with diabetic polyneuropathy, with long-term current use of insulin ([place]) 04/11/2019   Uncontrolled type 2 diabetes mellitus with insulin therapy ([place]) 04/16/2018   Allergic rhinitis 03/06/2018   Unilateral complete BKA, left, sequela ([name])    Class 3 severe obesity due to excess calories with serious comorbidity and body mass index (BMI) of 50.0 to 59.9 in adult [facility])    AKI (  acute kidney injury) ([place])    Decreased pedal pulses 06/10/2017   Unilateral primary osteoarthritis, right knee 10/22/2016   Primary osteoarthritis of first carpometacarpal joint of left hand 07/30/2016   Diabetic neuropathy ([place]) 07/14/2016   Nausea 03/17/2016   De Quervain's tenosynovitis, bilateral 11/01/2015   Vitamin D deficiency 09/05/2015   Recurrent candidiasis of vagina 09/05/2015   Varicose veins of leg with complications 78/41/2820   Restless leg syndrome 10/17/2014   Chronic sinusitis 07/18/2014   Encounter for chronic pain management 06/30/2013   HLD (hyperlipidemia) 11/19/2012   Chest pain 06/27/2012   Right carpal tunnel syndrome 09/01/2011   Bilateral knee pain 09/01/2011   DM (diabetes mellitus) type II uncontrolled, periph vascular disorder ([place]) 05/22/2008   Morbid obesity ([place]) 05/22/2008   OBESITY HYPOVENTILATION SYNDROME 05/22/2008   Depression 05/22/2008   Obstructive sleep apnea 05/22/2008   Hypertension 05/22/2008   Asthma 05/22/2008   GERD 05/22/2008    Jamey Reas PT, DPT 06/04/2020, 1:00 PM  [facility] [address] [place], [place], [address] Phone: [phone]   Fax:  [phone]  Name: [name] MRN: [id number] Date of Birth: Jun 04, 1976

## 2020-06-05 DIAGNOSIS — E1140 Type 2 diabetes mellitus with diabetic neuropathy, unspecified: Secondary | ICD-10-CM | POA: Diagnosis not present

## 2020-06-06 ENCOUNTER — Telehealth: Payer: Self-pay | Admitting: Pharmacist

## 2020-06-06 DIAGNOSIS — E1140 Type 2 diabetes mellitus with diabetic neuropathy, unspecified: Secondary | ICD-10-CM | POA: Diagnosis not present

## 2020-06-06 NOTE — Telephone Encounter (Signed)
Contacted patient to follow-up on blood sugar control.    Patient recognized that her blood sugars were running high today as I asked her to review her current readings. (> 300).  We discussed her readings yesterday being in the mid 200s throughout most of the day until she ate dinner at 9:30 PM.   We also discussed how her readings were in the mid-100s on Saturday and Sunday during the past week (a few days ago).  She recognized they were good over the weekend.  She denies any symptomatic low readings or symptoms.   We agreed to make no changes to her diabetes drug therapy at this time.  I shared with her that I would review her blood sugars again next week and call only if I thought a change in therapy was needed.  She was agreeable with that plan.

## 2020-06-07 ENCOUNTER — Other Ambulatory Visit: Payer: Self-pay | Admitting: Family Medicine

## 2020-06-09 DIAGNOSIS — E1140 Type 2 diabetes mellitus with diabetic neuropathy, unspecified: Secondary | ICD-10-CM | POA: Diagnosis not present

## 2020-06-10 DIAGNOSIS — E1140 Type 2 diabetes mellitus with diabetic neuropathy, unspecified: Secondary | ICD-10-CM | POA: Diagnosis not present

## 2020-06-10 NOTE — Progress Notes (Deleted)
Cardiology Office Note    Date:  06/10/2020   ID:  [name], DOB [birth year], MRN [id number]  PCP:  [name], MD  Cardiologist:  Sherren Mocha, MD  Electrophysiologist:  None   Chief Complaint: f/u chest pain/stress test and SVT  History of Present Illness:   [name] is a 44 y.o. female with history of normal coronary arteries by cardiac catheterization in 2013, morbid obesity, bipolar disorder, anemia, cellulitis, COPD, costochrondritis, HLD (managed by primary care), HTN, OSA, peripheral neuropathy, SVT, varicose veins, chronic pain, and uncontrolled diabetes with complications. 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 She has h/o low functional capacity. She was recently seen in the office 05/09/20 by Dr. Burt Knack reporting some SOB with activity as well as chest discomfort without clear precipitating factor. Lexiscan nuc 05/13/20 was normal with normal LVEF. Dr. Burt Knack suspected chest discomfort was noncardiac. Last echo in 08/2017 EF 55-60%, no RWMA, mildly dilated LA. She was seen at [facility] on 05/27/2020 for supraventricular tachycardia by EMS. She reportedly converted to NSR with adenosine. A CT angio chest at that time was unremarkable. No mention of coronary calcification on scan. hsTroponins were low/flat 17-18. She was started on Toprol 25mg  daily per phone notes. She was subsequently seen in the ED 05/29/20 with SOB and troponins were negative. She was treated with nebulizers and discharged from ED.  No recent TSH F/u BMET mg  Chest pain, nonspecific with dyspnea on exertion Essential HTN Hyperlipidemia PSVT   Labwork independently reviewed: 05/29/20 hsTroponin negative x2, K 3.6, Cr 0.76, Hgb 11.7, WBC 10.8 05/27/20 hsTroponin 17-18 04/2020 Lipid panel severely deranged with all values abnormal, LDL 173, HDL 39   Past Medical History:  Diagnosis Date    . Acute osteomyelitis of ankle or foot, left ([place]) 01/31/2018  . Alveolar hypoventilation   . Anemia    not on iron pill  . Asthma   . Bipolar 2 disorder ([place])   . Carpal tunnel syndrome on right    recurrent  . Cellulitis 08/2010-08/2011  . Chronic pain   . COPD (chronic obstructive pulmonary disease) ([name])    Symbicort daily and Proventil as needed  . Costochondritis   . Depression   . Diabetes mellitus type II, uncontrolled ([place]) 2000   Type 2, Uncontrolled.Takes Lantus daily.Fasting blood sugar runs 150  . Dizziness    occasionally  . Drug-seeking behavior   . GERD (gastroesophageal reflux disease)    takes Pantoprazole and Zantac daily  . Headache    migraine-last one about a yr ago.Topamax daily  . HLD (hyperlipidemia)    takes Atorvastatin daily  . Hypertension    takes Lisinopril and Coreg daily  . Morbid obesity ([place])   . Muscle spasm    takes Flexeril as needed  . Nocturia   . OSA on CPAP   . Peripheral neuropathy    takes Gabapentin daily  . Pneumonia    "walking" several yrs ago and as a baby (12/05/2018)  . Rectal fissure   . Restless leg   . SVT (supraventricular tachycardia) ([place])   . Syncope 02/25/2016  . Urinary frequency   . Varicose veins    Right medial thigh and Left leg     Past Surgical History:  Procedure Laterality Date  . AMPUTATION Left 02/01/2018   Procedure: LEFT FOURTH AND 5TH TOE RAY AMPUTATION;  Surgeon: [name], MD;  Location: [facility];  Service: Orthopedics;  Laterality: Left;  . AMPUTATION Left 03/03/2018   Procedure: LEFT BELOW KNEE AMPUTATION;  Surgeon: [name], MD;  Location: [facility];  Service: Orthopedics;  Laterality: Left;  . CARPAL TUNNEL RELEASE Bilateral   . CESAREAN SECTION  2007  . INCISION AND DRAINAGE PERIRECTAL ABSCESS Left 05/18/2019   Procedure: IRRIGATION AND DEBRIDEMENT OF PANNIS ABSCESS, POSSIBLE DEBRIDEMENT OF BUTTOCK WOUND;  Surgeon: Donnie Mesa, MD;  Location: [place];  Service: General;  Laterality:  Left;  . IRRIGATION AND DEBRIDEMENT BUTTOCKS Left 05/17/2019   Procedure: IRRIGATION AND DEBRIDEMENT BUTTOCKS;  Surgeon: Donnie Mesa, MD;  Location: [facility];  Service: General;  Laterality: Left;  . KNEE ARTHROSCOPY Right 07/17/2010  . LEFT HEART CATHETERIZATION WITH CORONARY ANGIOGRAM N/A 07/27/2012   Procedure: LEFT HEART CATHETERIZATION WITH CORONARY ANGIOGRAM;  Surgeon: Sherren Mocha, MD;  Location: [facility] CATH LAB;  Service: Cardiovascular;  Laterality: N/A;  . MASS EXCISION N/A 06/29/2013   Procedure:  WIDE LOCAL EXCISION OF POSTERIOR NECK ABSCESS;  Surgeon: Ralene Ok, MD;  Location: [place];  Service: General;  Laterality: N/A;  . REPAIR KNEE LIGAMENT Left    "fixed ligaments and chipped patella"  . right transmetatarsal amputation       Current Medications: No outpatient medications have been marked as taking for the 06/12/20 encounter (Appointment) with Charlie Pitter, PA-C.   ***   Allergies:   Cefepime, Kiwi extract, Morphine and related, Trental [pentoxifylline], and Nubain [nalbuphine hcl]   Social History   Socioeconomic History  . Marital status: Married    Spouse name: Not on file  . Number of children: Not on file  . Years of education: Not on file  . Highest education level: Not on file  Occupational History  . Not on file  Tobacco Use  . Smoking status: Former Smoker    Packs/day: 0.30    Years: 0.30    Pack years: 0.09    Types: Cigarettes    Quit date: 12/06/1993    Years since quitting: 26.5  . Smokeless tobacco: Never Used  Vaping Use  . Vaping Use: Never used  Substance and Sexual Activity  . Alcohol use: No    Alcohol/week: 0.0 standard drinks  . Drug use: Yes    Comment: OD attempts on home meds    . Sexual activity: Yes    Partners: Male  Other Topics Concern  . Not on file  Social History Narrative   Lives in [place] with her fiance and 42 yr old dtr.   Social Determinants of Health   Financial Resource Strain:   . Difficulty of Paying Living  Expenses:   Food Insecurity:   . Worried About Charity fundraiser in the Last Year:   . Arboriculturist in the Last Year:   Transportation Needs:   . Film/video editor (Medical):   Marland Kitchen Lack of Transportation (Non-Medical):   Physical Activity:   . Days of Exercise per Week:   . Minutes of Exercise per Session:   Stress:   . Feeling of Stress :   Social Connections:   . Frequency of Communication with Friends and Family:   . Frequency of Social Gatherings with Friends and Family:   . Attends Religious Services:   . Active Member of Clubs or Organizations:   . Attends Archivist Meetings:   Marland Kitchen Marital Status:      Family History:  The patient's ***family history includes Allergic  rhinitis in her mother; Anxiety disorder in her sister; Cancer in her maternal grandmother; Depression in her mother; Diabetes in her maternal grandmother and mother; GER disease in her mother; Heart attack in her father, paternal grandfather, paternal grandmother, and paternal uncle; Heart disease in her paternal grandfather and paternal grandmother; Hyperlipidemia in her maternal grandmother and mother; Hypertension in her maternal grandmother; Migraines in her sister; Other in her maternal grandfather; Restless legs syndrome in her mother.  ROS:   Please see the history of present illness. Otherwise, review of systems is positive for ***.  All other systems are reviewed and otherwise negative.    EKGs/Labs/Other Studies Reviewed:    Studies reviewed are outlined and summarized above. Reports included below if pertinent.  NST 05/13/20  Nuclear stress EF: 56%.  There was no ST segment deviation noted during stress.  The study is normal.  This is a low risk study.  The left ventricular ejection fraction is normal (55-65%).   Normal pharmacologic nuclear study with no evidence for prior infarct or ischemia. Normal LVEF.   2D echo 08/2017 - Left ventricle: The cavity size was normal.  Systolic function was  normal. The estimated ejection fraction was in the range of 55%  to 60%. Wall motion was normal; there were no regional wall  motion abnormalities.  - Left atrium: The atrium was mildly dilated. Volume/bsa, ES  (1-plane Simpson&'s, A4C): 36.6 ml/m^2.     EKG:  EKG is ordered today, personally reviewed, demonstrating ***  Recent Labs: 03/26/2020: ALT 16 05/29/2020: BUN 26; Creatinine, Ser 0.76; Hemoglobin 11.7; Platelets 343; Potassium 3.6; Sodium 140  Recent Lipid Panel    Component Value Date/Time   CHOL 274 (H) 04/10/2020 1200   TRIG 324 (H) 04/10/2020 1200   HDL 39 (L) 04/10/2020 1200   CHOLHDL 7.0 (H) 04/10/2020 1200   CHOLHDL 6.3 (H) 01/11/2017 0909   VLDL 52 (H) 01/11/2017 0909   LDLCALC 173 (H) 04/10/2020 1200   LDLDIRECT 164.9 11/15/2014 1148    PHYSICAL EXAM:    VS:  LMP 05/15/2020   BMI: There is no height or weight on file to calculate BMI.  GEN: Well nourished, well developed, in no acute distress HEENT: normocephalic, atraumatic Neck: no JVD, carotid bruits, or masses Cardiac: ***RRR; no murmurs, rubs, or gallops, no edema  Respiratory:  clear to auscultation bilaterally, normal work of breathing GI: soft, nontender, nondistended, + BS MS: no deformity or atrophy Skin: warm and dry, no rash Neuro:  Alert and Oriented x 3, Strength and sensation are intact, follows commands Psych: euthymic mood, full affect  Wt Readings from Last 3 Encounters:  05/29/20 (!) 360 lb (163.3 kg)  05/27/20 (!) 334 lb (151.5 kg)  05/09/20 (!) 334 lb (151.5 kg)     ASSESSMENT & PLAN:   1. ***  Disposition: F/u with ***   Medication Adjustments/Labs and Tests Ordered: Current medicines are reviewed at length with the patient today.  Concerns regarding medicines are outlined above. Medication changes, Labs and Tests ordered today are summarized above and listed in the Patient Instructions accessible in Encounters.   Signed, Charlie Pitter, PA-C    06/10/2020 8:01 AM    [facility] [address], [place], [place]  [address] Phone: [phone]; Fax: [phone]

## 2020-06-11 ENCOUNTER — Telehealth: Payer: Self-pay | Admitting: Rehabilitation

## 2020-06-11 ENCOUNTER — Ambulatory Visit: Payer: Medicaid Other | Attending: Family Medicine | Admitting: Rehabilitation

## 2020-06-11 DIAGNOSIS — E1140 Type 2 diabetes mellitus with diabetic neuropathy, unspecified: Secondary | ICD-10-CM | POA: Diagnosis not present

## 2020-06-11 NOTE — Telephone Encounter (Signed)
Pt had an appt today at 1015 for PT.  She did not come to scheduled appt and did not call to cancel.  PT called and left v/m for pt to inform her of no/call no show policy and that this would count as 1/3 no shows and also to inform her of next appt on 7/15 with Juliann Pulse.    Cameron Sprang, PT, MPT [facility] [address] [place], [place], [address] Phone: [phone]   Fax:  [phone] 06/11/20, 10:52 AM

## 2020-06-12 ENCOUNTER — Ambulatory Visit: Payer: Medicaid Other | Admitting: Physician Assistant

## 2020-06-12 ENCOUNTER — Emergency Department (HOSPITAL_COMMUNITY)
Admission: EM | Admit: 2020-06-12 | Discharge: 2020-06-12 | Disposition: A | Discharge status: Home / Self Care | Payer: Medicaid Other | Attending: Emergency Medicine | Admitting: Emergency Medicine

## 2020-06-12 ENCOUNTER — Other Ambulatory Visit: Payer: Self-pay

## 2020-06-12 ENCOUNTER — Emergency Department (HOSPITAL_COMMUNITY): Payer: Medicaid Other

## 2020-06-12 ENCOUNTER — Telehealth: Payer: Self-pay | Admitting: Student in an Organized Health Care Education/Training Program

## 2020-06-12 ENCOUNTER — Telehealth: Payer: Self-pay | Admitting: Pharmacist

## 2020-06-12 ENCOUNTER — Encounter (HOSPITAL_COMMUNITY): Payer: Self-pay

## 2020-06-12 DIAGNOSIS — R0689 Other abnormalities of breathing: Secondary | ICD-10-CM | POA: Diagnosis not present

## 2020-06-12 DIAGNOSIS — I10 Essential (primary) hypertension: Secondary | ICD-10-CM | POA: Insufficient documentation

## 2020-06-12 DIAGNOSIS — Z794 Long term (current) use of insulin: Secondary | ICD-10-CM

## 2020-06-12 DIAGNOSIS — R0602 Shortness of breath: Secondary | ICD-10-CM | POA: Insufficient documentation

## 2020-06-12 DIAGNOSIS — R739 Hyperglycemia, unspecified: Secondary | ICD-10-CM

## 2020-06-12 DIAGNOSIS — R0789 Other chest pain: Secondary | ICD-10-CM | POA: Insufficient documentation

## 2020-06-12 DIAGNOSIS — Z87891 Personal history of nicotine dependence: Secondary | ICD-10-CM | POA: Insufficient documentation

## 2020-06-12 DIAGNOSIS — E1165 Type 2 diabetes mellitus with hyperglycemia: Secondary | ICD-10-CM | POA: Diagnosis not present

## 2020-06-12 DIAGNOSIS — Z79899 Other long term (current) drug therapy: Secondary | ICD-10-CM | POA: Insufficient documentation

## 2020-06-12 DIAGNOSIS — R531 Weakness: Secondary | ICD-10-CM | POA: Diagnosis not present

## 2020-06-12 DIAGNOSIS — J9811 Atelectasis: Secondary | ICD-10-CM | POA: Diagnosis not present

## 2020-06-12 DIAGNOSIS — J90 Pleural effusion, not elsewhere classified: Secondary | ICD-10-CM | POA: Diagnosis not present

## 2020-06-12 DIAGNOSIS — E1142 Type 2 diabetes mellitus with diabetic polyneuropathy: Secondary | ICD-10-CM

## 2020-06-12 DIAGNOSIS — R0902 Hypoxemia: Secondary | ICD-10-CM | POA: Diagnosis not present

## 2020-06-12 DIAGNOSIS — R079 Chest pain, unspecified: Secondary | ICD-10-CM | POA: Diagnosis not present

## 2020-06-12 DIAGNOSIS — M2550 Pain in unspecified joint: Secondary | ICD-10-CM | POA: Diagnosis not present

## 2020-06-12 DIAGNOSIS — J449 Chronic obstructive pulmonary disease, unspecified: Secondary | ICD-10-CM | POA: Diagnosis not present

## 2020-06-12 DIAGNOSIS — Z7401 Bed confinement status: Secondary | ICD-10-CM | POA: Diagnosis not present

## 2020-06-12 DIAGNOSIS — E1140 Type 2 diabetes mellitus with diabetic neuropathy, unspecified: Secondary | ICD-10-CM | POA: Diagnosis not present

## 2020-06-12 LAB — BASIC METABOLIC PANEL
Anion gap: 10 (ref 5–15)
BUN: 25 mg/dL — ABNORMAL HIGH (ref 6–20)
CO2: 24 mmol/L (ref 22–32)
Calcium: 8.5 mg/dL — ABNORMAL LOW (ref 8.9–10.3)
Chloride: 102 mmol/L (ref 98–111)
Creatinine, Ser: 0.91 mg/dL (ref 0.44–1.00)
GFR calc Af Amer: >60 mL/min (ref >60)
GFR calc non Af Amer: >60 mL/min (ref >60)
Glucose, Bld: 401 mg/dL — ABNORMAL HIGH (ref 70–99)
Potassium: 4.5 mmol/L (ref 3.5–5.1)
Sodium: 136 mmol/L (ref 135–145)

## 2020-06-12 LAB — CBC WITH DIFFERENTIAL/PLATELET
Abs Immature Granulocytes: 0.09 10*3/uL — ABNORMAL HIGH (ref 0.00–0.07)
Basophils Absolute: 0.1 10*3/uL (ref 0.0–0.1)
Basophils Relative: 1 %
Eosinophils Absolute: 0.3 10*3/uL (ref 0.0–0.5)
Eosinophils Relative: 3 %
HCT: 35.7 % — ABNORMAL LOW (ref 36.0–46.0)
Hemoglobin: 10.9 g/dL — ABNORMAL LOW (ref 12.0–15.0)
Immature Granulocytes: 1 %
Lymphocytes Relative: 31 %
Lymphs Abs: 3.6 10*3/uL (ref 0.7–4.0)
MCH: 29 pg (ref 26.0–34.0)
MCHC: 30.5 g/dL (ref 30.0–36.0)
MCV: 94.9 fL (ref 80.0–100.0)
Monocytes Absolute: 0.9 10*3/uL (ref 0.1–1.0)
Monocytes Relative: 8 %
Neutro Abs: 6.5 10*3/uL (ref 1.7–7.7)
Neutrophils Relative %: 56 %
Platelets: 345 10*3/uL (ref 150–400)
RBC: 3.76 MIL/uL — ABNORMAL LOW (ref 3.87–5.11)
RDW: 13.4 % (ref 11.5–15.5)
WBC: 11.4 10*3/uL — ABNORMAL HIGH (ref 4.0–10.5)
nRBC: 0 % (ref 0.0–0.2)

## 2020-06-12 LAB — TROPONIN I (HIGH SENSITIVITY): Troponin I (High Sensitivity): 13 ng/L (ref <18)

## 2020-06-12 MED ORDER — ACETAMINOPHEN 325 MG PO TABS
650.0000 mg | ORAL_TABLET | Freq: Once | ORAL | Status: AC
  Administered 2020-06-12: 650 mg via ORAL
  Filled 2020-06-12: qty 2

## 2020-06-12 MED ORDER — INSULIN ISOPHANE & REGULAR (HUMAN 70-30)100 UNIT/ML KWIKPEN: PEN_INJECTOR | SUBCUTANEOUS | Status: DC

## 2020-06-12 NOTE — Telephone Encounter (Signed)
Contacted patient to discuss high blood glucose readings on CGM.  Patient has been routinely > 300 for the last 7 days.    Patient reports bing sick for the last week.   She reports adherence with insulin regimen of 70/30 at 100 units in the AM and 60 units in the PM.   Following discussion, patient agreed to increase evening insulin dose to 80 units.  She will use this higher dose tonight.   She has an appointment with Dr. [name] in 5 days (Tuesday).

## 2020-06-12 NOTE — Discharge Instructions (Addendum)
You were seen today for chest pain and shortness of breath.  Again your work-up is largely reassuring.  Your EKG shows no signs of acute ischemia and your lab tests are reassuring.  Your x-ray shows no evidence of pneumothorax.  You should follow-up with cardiology as scheduled later today.  Continue to wear your CPAP at night as some of your symptoms may be related to obstructive sleep apnea.

## 2020-06-12 NOTE — Telephone Encounter (Signed)
**  After Hours/ Emergency Line Call*  S: Daughter is calling for mom as she is having a hard time speaking. She is also having chest pain/tightness. Patient is "having trouble breathing. Like can't catch her breath." patient has gone to ED for this previously (June 22nd and 24th) and daughter says they gave her a breathing treatment and pain meds and sent her home. This episode is similar to those. At those visits, she was evaluated for ACS and covid which were negative. Had SVT 6/22. 6/24 given atrovent, ASA, duoneb, albuterol, fentanyl, decadron and felt better.  H/o asthma  She is currently speaking in shortened sentences and sounding short of breath. Does not have breathing treatments at home.    A/P: - unable to assess over the phone properly. Given her concerning symptoms for cardiac/respiratory distress and no breathing treatments at home, recommended she present to the ED for further evaluation and treatment. - daughter expressed understanding and denied any questions at this time.

## 2020-06-12 NOTE — ED Triage Notes (Signed)
Pt from home via EMS for eval of SOB/CP. Pt went to sleep at 0300, woke up at 0430 with feeling of chest pain/SOB. Lung sounds clear, but pt claims 8/10 CP- center of chest, non-radiating. Given 324 asa & 0.4ntgx2, brought pain down to 7/10. Pt has follow up cardiologist appt today (7/8) for 1st & only episode of SVT in June

## 2020-06-12 NOTE — ED Provider Notes (Signed)
[facility] EMERGENCY DEPARTMENT Provider Note   CSN: [id number] Arrival date & time: 06/12/20  0540     History Chief Complaint  Patient presents with  . Chest Pain  . Shortness of Breath    [name] is a 44 y.o. female.  HPI     44 year old female with a history of morbid obesity, osteomyelitis of the left foot and ankle status post amputation, chronic pain, COPD, diabetes, obstructive sleep apnea who presents with chest pain or shortness of breath.  Patient reports that she woke this morning around 330 a.m. with shortness of breath.  She states that she felt like she was suffocating.  She subsequently had onset of chest pain.  She reports anterior pressure-like chest pain that is described as squeezing.  It is nonradiating.  Denies any fevers or cough.  She was wearing her CPAP machine but states "I feel like it makes things worse."  She is not noted any increased lower extremity swelling.  She currently rates her pain a 7 out of 10.  Minimal relief with nitroglycerin on route.  She was also given aspirin.  No known history of heart disease but does have a history of SVT.  She is actually due to see cardiology later today for an outpatient appointment.   Past Medical History:  Diagnosis Date  . Acute osteomyelitis of ankle or foot, left ([place]) 01/31/2018  . Alveolar hypoventilation   . Anemia    not on iron pill  . Asthma   . Bipolar 2 disorder ([place])   . Carpal tunnel syndrome on right    recurrent  . Cellulitis 08/2010-08/2011  . Chronic pain   . COPD (chronic obstructive pulmonary disease) ([name])    Symbicort daily and Proventil as needed  . Costochondritis   . Depression   . Diabetes mellitus type II, uncontrolled ([place]) 2000   Type 2, Uncontrolled.Takes Lantus daily.Fasting blood sugar runs 150  . Dizziness    occasionally  . Drug-seeking behavior   . GERD (gastroesophageal reflux disease)    takes Pantoprazole and Zantac daily  . Headache     migraine-last one about a yr ago.Topamax daily  . HLD (hyperlipidemia)    takes Atorvastatin daily  . Hypertension    takes Lisinopril and Coreg daily  . Morbid obesity ([place])   . Muscle spasm    takes Flexeril as needed  . Nocturia   . OSA on CPAP   . Peripheral neuropathy    takes Gabapentin daily  . Pneumonia    "walking" several yrs ago and as a baby (12/05/2018)  . Rectal fissure   . Restless leg   . SVT (supraventricular tachycardia) ([place])   . Syncope 02/25/2016  . Urinary frequency   . Varicose veins    Right medial thigh and Left leg     Patient Active Problem List   Diagnosis Date Noted  . Blister of foot 04/21/2020  . Exposure to mold 04/21/2020  . Constipation 04/03/2020  . Status post amputation of toe of right foot ([place]) 02/13/2020  . UTI (urinary tract infection) 06/28/2019  . Amputation above knee ([place]) 06/23/2019  . Left shoulder pain 06/23/2019  . Abscess of buttock, right 05/17/2019  . Severe protein-calorie malnutrition ([place])   . 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[place]) 04/11/2019  . Type 2 diabetes mellitus with diabetic polyneuropathy, with long-term current use of insulin ([place]) 04/11/2019  . Uncontrolled type 2 diabetes mellitus with insulin therapy ([place]) 04/16/2018  . Allergic rhinitis 03/06/2018  .  Unilateral complete BKA, left, sequela ([name])   . Class 3 severe obesity due to excess calories with serious comorbidity and body mass index (BMI) of 50.0 to 59.9 in adult [facility])   . AKI (acute kidney injury) ([place])   . Decreased pedal pulses 06/10/2017  . Unilateral primary osteoarthritis, right knee 10/22/2016  . Primary osteoarthritis of first carpometacarpal joint of left hand 07/30/2016  . Diabetic neuropathy ([place]) 07/14/2016  . Nausea 03/17/2016  . De Quervain's tenosynovitis, bilateral 11/01/2015  . Vitamin D deficiency 09/05/2015  . Recurrent candidiasis of vagina 09/05/2015  . Varicose veins of leg with complications 27/05/2375  . Restless leg syndrome  10/17/2014  . Chronic sinusitis 07/18/2014  . Encounter for chronic pain management 06/30/2013  . HLD (hyperlipidemia) 11/19/2012  . Chest pain 06/27/2012  . Right carpal tunnel syndrome 09/01/2011  . Bilateral knee pain 09/01/2011  . DM (diabetes mellitus) type II uncontrolled, periph vascular disorder ([place]) 05/22/2008  . Morbid obesity ([place]) 05/22/2008  . 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 Depression 05/22/2008  . Obstructive sleep apnea 05/22/2008  . Hypertension 05/22/2008  . Asthma 05/22/2008  . GERD 05/22/2008    Past Surgical History:  Procedure Laterality Date  . AMPUTATION Left 02/01/2018   Procedure: LEFT FOURTH AND 5TH TOE RAY AMPUTATION;  Surgeon: [name], MD;  Location: [facility];  Service: Orthopedics;  Laterality: Left;  . AMPUTATION Left 03/03/2018   Procedure: LEFT BELOW KNEE AMPUTATION;  Surgeon: [name], MD;  Location: [facility];  Service: Orthopedics;  Laterality: Left;  . CARPAL TUNNEL RELEASE Bilateral   . CESAREAN SECTION  2007  . INCISION AND DRAINAGE PERIRECTAL ABSCESS Left 05/18/2019   Procedure: IRRIGATION AND DEBRIDEMENT OF PANNIS ABSCESS, POSSIBLE DEBRIDEMENT OF BUTTOCK WOUND;  Surgeon: Donnie Mesa, MD;  Location: [place] [facility];  Service: General;  Laterality: Left;  . IRRIGATION AND DEBRIDEMENT BUTTOCKS Left 05/17/2019   Procedure: IRRIGATION AND DEBRIDEMENT BUTTOCKS;  Surgeon: Donnie Mesa, MD;  Location: [facility];  Service: General;  Laterality: Left;  . KNEE ARTHROSCOPY Right 07/17/2010  . LEFT HEART CATHETERIZATION WITH CORONARY ANGIOGRAM N/A 07/27/2012   Procedure: LEFT HEART CATHETERIZATION WITH CORONARY ANGIOGRAM;  Surgeon: Sherren Mocha, MD;  Location: [facility] C[facility] LAB;  Service: Cardiovascular;  Laterality: N/A;  . MASS EXCISION N/A 06/29/2013   Procedure:  WIDE LOCAL EXCISION OF POSTERIOR NECK ABSCESS;  Surgeon: Ralene Ok, MD;  Location: [facility];  Service: General;  Laterality: N/A;  . REPAIR KNEE LIGAMENT Left    "fixed ligaments and  chipped patella"  . right transmetatarsal amputation        OB History    Gravida  2   Para  1   Term      Preterm      AB  1   Living  1     SAB  1   TAB      Ectopic      Multiple      Live Births              Family History  Problem Relation Age of Onset  . Diabetes Mother   . Hyperlipidemia Mother   . Depression Mother   . GER disease Mother   . Allergic rhinitis Mother   . Restless legs syndrome Mother   . Heart attack Paternal Uncle   . Heart disease Paternal Grandmother   . Heart attack Paternal Grandmother   . Heart attack Paternal Grandfather   . Heart disease Paternal Grandfather   .  Heart attack Father   . Migraines Sister   . Cancer Maternal Grandmother        COLON  . Hypertension Maternal Grandmother   . Hyperlipidemia Maternal Grandmother   . Diabetes Maternal Grandmother   . Other Maternal Grandfather        GUN SHOT  . Anxiety disorder Sister     Social History   Tobacco Use  . Smoking status: Former Smoker    Packs/day: 0.30    Years: 0.30    Pack years: 0.09    Types: Cigarettes    Quit date: 12/06/1993    Years since quitting: 26.5  . Smokeless tobacco: Never Used  Vaping Use  . Vaping Use: Never used  Substance Use Topics  . Alcohol use: No    Alcohol/week: 0.0 standard drinks  . Drug use: Yes    Comment: OD attempts on home meds      Home Medications Prior to Admission medications   Medication Sig Start Date End Date Taking? Authorizing Provider  Accu-Chek Softclix Lancets lancets Use to check blood glucose four times daily 04/17/20   Martyn Malay, MD  amLODipine (NORVASC) 10 MG tablet Take 1 tablet (10 mg total) by mouth daily. 05/25/19   [name], MD  atorvastatin (LIPITOR) 40 MG tablet Take 1 tablet (40 mg total) by mouth daily. 04/11/20   [name], MD  budesonide-formoterol [facility]) 160-4.5 MCG/ACT inhaler Inhale 2 puffs into the lungs 2 (two) times daily. 05/09/20   [name], MD    cetirizine (ZYRTEC) 10 MG tablet Take 10 mg by mouth daily as needed for allergies.    [provider]  Cholecalciferol 1000 units tablet Take 1 tablet (1,000 Units total) by mouth daily. 04/05/17   [name], MD  Continuous Blood Gluc Sensor (DEXCOM G6 SENSOR) MISC Inject 1 applicator into the skin as directed. Change sensor every 10 days. 04/10/20   Leavy Cella, RPH-CPP  Continuous Blood Gluc Transmit (DEXCOM G6 TRANSMITTER) MISC Inject 1 Device into the skin as directed. Reuse 8 times with sensor changes. 04/10/20   Leavy Cella, RPH-CPP  cyclobenzaprine (FLEXERIL) 5 MG tablet TAKE 1 TABLET BY MOUTH 3 TIMES DAILY AS NEEDED FOR MUSCLE SPASMS 04/11/20   [name], MD  gabapentin (NEURONTIN) 600 MG tablet TAKE 2 TABLETS BY MOUTH 3 TIMES DAILY 05/09/20   [name], MD  glucose blood (ACCU-CHEK GUIDE) test strip USE T0 CHECK BLOOD SUGAR 3 TIMES DAILY 04/18/20   Leavy Cella, RPH-CPP  HYDROcodone-acetaminophen (NORCO) 10-325 MG tablet TAKE 1 TABLET BY MOUTH EVERY SIX TO EIGHT HOURS AS NEEDED 06/11/20   Lind Covert, MD  insulin isophane & regular human (HUMULIN 70/30 MIX) (70-30) 100 UNIT/ML KwikPen Inject 100 Units into the skin daily with breakfast AND 60 Units daily with supper. 05/16/20   [name], MD  Insulin Pen Needle (SURE COMFORT PEN NEEDLES) 32G X 4 MM MISC Use to inject insulin as indicated 04/17/20   Martyn Malay, MD  Lancets (ACCU-CHEK MULTICLIX) lancets 1 each by Other route as needed for other. Use as instructed 04/18/20   Leavy Cella, RPH-CPP  metoprolol succinate (TOPROL XL) 25 MG 24 hr tablet Take 1 tablet (25 mg total) by mouth daily. 05/28/20 05/23/21  Sherren Mocha, MD  ondansetron (ZOFRAN ODT) 4 MG disintegrating tablet Take 1 tablet (4 mg total) by mouth every 8 (eight) hours as needed for nausea or vomiting. 03/26/20   Albrizze,  Kaitlyn E, PA-C  pantoprazole (PROTONIX) 40 MG tablet Take 1 tablet (40 mg total) by mouth daily.  03/16/18   Angiulli, Lavon Paganini, PA-C  PROVENTIL HFA 108 (90 Base) MCG/ACT inhaler Inhale 1-2 puffs into the lungs every 6 (six) hours as needed for wheezing or shortness of breath. Patient taking differently: Inhale 2 puffs into the lungs every 4 (four) hours as needed for wheezing.  05/11/19   [name], MD  rOPINIRole (REQUIP) 1 MG tablet Take 1 tablet (1 mg total) by mouth at bedtime. 04/03/20   [name], MD  terconazole (TERAZOL 7) 0.4 % vaginal cream Place 1 applicator vaginally at bedtime. 04/03/20   [name], MD  VICTOZA 18 MG/3ML SOPN inject 0.6 MG SUBCUTANEOUSLY ONCE A DAY FOR 1 WEEK, THEN INCREASE TO 1.2 MG ONCE daily max 1.8 MG 04/16/20   [name], MD    Allergies    Cefepime, Kiwi extract, Morphine and related, Trental [pentoxifylline], and Nubain [nalbuphine hcl]  Review of Systems   Review of Systems  Constitutional: Negative for fever.  Respiratory: Positive for shortness of breath. Negative for cough.   Cardiovascular: Positive for chest pain. Negative for leg swelling.  Gastrointestinal: Negative for abdominal pain, nausea and vomiting.  Genitourinary: Negative for dysuria.  All other systems reviewed and are negative.   Physical Exam Updated Vital Signs BP 133/81 (BP Location: Right Wrist)   Pulse 93   Temp 98.2 F (36.8 C) (Oral)   Resp (!) 22   Ht 1.575 m (5\' 2" )   Wt (!) 163.3 kg   LMP 05/15/2020   SpO2 98%   BMI 65.84 kg/m   Physical Exam Vitals and nursing note reviewed.  Constitutional:      Appearance: She is well-developed. She is obese.     Comments: Chronically ill-appearing, appears older than stated age, no acute distress  HENT:     Head: Normocephalic and atraumatic.  Eyes:     Pupils: Pupils are equal, round, and reactive to light.  Cardiovascular:     Rate and Rhythm: Normal rate and regular rhythm.     Heart sounds: Normal heart sounds.  Pulmonary:     Effort: Pulmonary effort is normal. No  respiratory distress.     Breath sounds: No wheezing.     Comments: Exam limited by body habitus, clear breath sounds bilaterally, no significant tachypnea, speaking in full sentences Abdominal:     General: Bowel sounds are normal.     Palpations: Abdomen is soft.  Musculoskeletal:     Cervical back: Neck supple.     Right lower leg: Edema present.     Comments: Left BKA  Skin:    General: Skin is warm and dry.  Neurological:     Mental Status: She is alert and oriented to person, place, and time.  Psychiatric:        Mood and Affect: Mood normal.     ED Results / Procedures / Treatments   Labs (all labs ordered are listed, but only abnormal results are displayed) Labs Reviewed  CBC WITH DIFFERENTIAL/PLATELET - Abnormal; Notable for the following components:      Result Value   WBC 11.4 (*)    RBC 3.76 (*)    Hemoglobin 10.9 (*)    HCT 35.7 (*)    Abs Immature Granulocytes 0.09 (*)    All other components within normal limits  BASIC METABOLIC PANEL - Abnormal; Notable for the following components:   Glucose,  Bld 401 (*)    BUN 25 (*)    Calcium 8.5 (*)    All other components within normal limits  TROPONIN I (HIGH SENSITIVITY)    EKG None   ED ECG REPORT   Date: 06/12/2020  Rate: 94  Rhythm: normal sinus rhythm  QRS Axis: normal  Intervals: normal  ST/T Wave abnormalities: normal  Conduction Disutrbances:none  Narrative Interpretation:   Old EKG Reviewed: unchanged  I have personally reviewed the EKG tracing and agree with the computerized printout as noted.   Radiology DG Chest 2 View  Result Date: 06/12/2020 CLINICAL DATA:  Chest pain. EXAM: CHEST - 2 VIEW COMPARISON:  05/29/2020. FINDINGS: Heart size stable. Low lung volumes with bibasilar atelectasis. Tiny bilateral pleural effusions cannot be excluded. No pneumothorax. IMPRESSION: Low lung volumes with bibasilar atelectasis. Tiny bilateral pleural effusions cannot be excluded. Electronically Signed    By: Marcello Moores  Register   On: 06/12/2020 06:30    Procedures Procedures (including critical care time)  Medications Ordered in ED Medications  acetaminophen (TYLENOL) tablet 650 mg (650 mg Oral Given 06/12/20 8676)    ED Course  I have reviewed the triage vital signs and the nursing notes.  Pertinent labs & imaging results that were available during my care of the patient were reviewed by me and considered in my medical decision making (see chart for details).    MDM Rules/Calculators/A&P                          Patient presents with shortness of breath and chest pain.  Acute in onset.  She is morbidly obese and has obstructive sleep apnea.  She has had multiple presentations with similar symptoms in the past.  Most recently in the end of June she had evaluation for chest pain and had a negative PE study.  She is nontoxic-appearing.  No respiratory distress.  Oxygen saturations 92% on room air.  She is not particularly mobile given her amputation.  EKG shows no signs of acute ischemia.  Doubt ACS.  Will send troponin for screening.  She recently had a PE evaluation that was negative.  Doubt PE.  She remains PE RC negative.  Breath sounds are clear with no wheezing although this is limited secondary to body habitus.  Doubt bronchitis.  High suspicion for symptoms related to apnea and her morbid obesity.  Lab work-up notable mostly for elevated blood sugar.  She has had this in the past.  No anion gap to suggest DKA.  I discussed the work-up with the patient.  She has had several negative work-ups for the same and has cardiology evaluation later today.  I discussed with her my suspicions that this may be related to her sleep apnea and her obesity.  Given that she is not hypoxic and is not requiring any further intervention, do not feel she needs further work-up at this time.  Will discharge so she can follow-up with cardiology as scheduled.  After history, exam, and medical workup I feel the patient  has been appropriately medically screened and is safe for discharge home. Pertinent diagnoses were discussed with the patient. Patient was given return precautions.    Final Clinical Impression(s) / ED Diagnoses Final diagnoses:  Atypical chest pain  SOB (shortness of breath)  Hyperglycemia    Rx / DC Orders ED Discharge Orders    None       Ellenie Salome, Barbette Hair, MD 06/12/20 [phone]

## 2020-06-13 ENCOUNTER — Telehealth: Payer: Self-pay | Admitting: Family Medicine

## 2020-06-13 ENCOUNTER — Emergency Department (HOSPITAL_COMMUNITY)
Admission: EM | Admit: 2020-06-13 | Discharge: 2020-06-13 | Disposition: A | Discharge status: Home / Self Care | Payer: Medicaid Other | Attending: Emergency Medicine | Admitting: Emergency Medicine

## 2020-06-13 ENCOUNTER — Inpatient Hospital Stay (HOSPITAL_COMMUNITY)
Admission: EM | Admit: 2020-06-13 | Discharge: 2020-06-15 | DRG: 189 | Disposition: A | Discharge status: Home / Self Care | Payer: Medicaid Other | Attending: Internal Medicine | Admitting: Internal Medicine

## 2020-06-13 ENCOUNTER — Telehealth: Payer: Self-pay | Admitting: *Deleted

## 2020-06-13 DIAGNOSIS — Z6841 Body Mass Index (BMI) 40.0 and over, adult: Secondary | ICD-10-CM | POA: Diagnosis not present

## 2020-06-13 DIAGNOSIS — G2581 Restless legs syndrome: Secondary | ICD-10-CM | POA: Diagnosis present

## 2020-06-13 DIAGNOSIS — J811 Chronic pulmonary edema: Secondary | ICD-10-CM | POA: Diagnosis present

## 2020-06-13 DIAGNOSIS — Z83438 Family history of other disorder of lipoprotein metabolism and other lipidemia: Secondary | ICD-10-CM

## 2020-06-13 DIAGNOSIS — Z89422 Acquired absence of other left toe(s): Secondary | ICD-10-CM

## 2020-06-13 DIAGNOSIS — Z91018 Allergy to other foods: Secondary | ICD-10-CM | POA: Diagnosis not present

## 2020-06-13 DIAGNOSIS — K219 Gastro-esophageal reflux disease without esophagitis: Secondary | ICD-10-CM | POA: Diagnosis not present

## 2020-06-13 DIAGNOSIS — I10 Essential (primary) hypertension: Secondary | ICD-10-CM | POA: Insufficient documentation

## 2020-06-13 DIAGNOSIS — Z20822 Contact with and (suspected) exposure to covid-19: Secondary | ICD-10-CM | POA: Diagnosis not present

## 2020-06-13 DIAGNOSIS — Z794 Long term (current) use of insulin: Secondary | ICD-10-CM | POA: Diagnosis not present

## 2020-06-13 DIAGNOSIS — R0600 Dyspnea, unspecified: Secondary | ICD-10-CM

## 2020-06-13 DIAGNOSIS — S78119A Complete traumatic amputation at level between unspecified hip and knee, initial encounter: Secondary | ICD-10-CM | POA: Diagnosis not present

## 2020-06-13 DIAGNOSIS — M86179 Other acute osteomyelitis, unspecified ankle and foot: Secondary | ICD-10-CM | POA: Diagnosis not present

## 2020-06-13 DIAGNOSIS — E1140 Type 2 diabetes mellitus with diabetic neuropathy, unspecified: Secondary | ICD-10-CM | POA: Diagnosis not present

## 2020-06-13 DIAGNOSIS — E1165 Type 2 diabetes mellitus with hyperglycemia: Secondary | ICD-10-CM | POA: Diagnosis not present

## 2020-06-13 DIAGNOSIS — Z833 Family history of diabetes mellitus: Secondary | ICD-10-CM | POA: Diagnosis not present

## 2020-06-13 DIAGNOSIS — E119 Type 2 diabetes mellitus without complications: Secondary | ICD-10-CM | POA: Diagnosis present

## 2020-06-13 DIAGNOSIS — Z87891 Personal history of nicotine dependence: Secondary | ICD-10-CM

## 2020-06-13 DIAGNOSIS — Z8249 Family history of ischemic heart disease and other diseases of the circulatory system: Secondary | ICD-10-CM

## 2020-06-13 DIAGNOSIS — Z7951 Long term (current) use of inhaled steroids: Secondary | ICD-10-CM | POA: Diagnosis not present

## 2020-06-13 DIAGNOSIS — E559 Vitamin D deficiency, unspecified: Secondary | ICD-10-CM | POA: Diagnosis present

## 2020-06-13 DIAGNOSIS — Z9981 Dependence on supplemental oxygen: Secondary | ICD-10-CM | POA: Diagnosis not present

## 2020-06-13 DIAGNOSIS — E43 Unspecified severe protein-calorie malnutrition: Secondary | ICD-10-CM | POA: Diagnosis present

## 2020-06-13 DIAGNOSIS — R0902 Hypoxemia: Secondary | ICD-10-CM

## 2020-06-13 DIAGNOSIS — R0602 Shortness of breath: Secondary | ICD-10-CM | POA: Diagnosis not present

## 2020-06-13 DIAGNOSIS — E1169 Type 2 diabetes mellitus with other specified complication: Secondary | ICD-10-CM | POA: Diagnosis not present

## 2020-06-13 DIAGNOSIS — IMO0002 Reserved for concepts with insufficient information to code with codable children: Secondary | ICD-10-CM | POA: Diagnosis present

## 2020-06-13 DIAGNOSIS — E785 Hyperlipidemia, unspecified: Secondary | ICD-10-CM | POA: Diagnosis not present

## 2020-06-13 DIAGNOSIS — J449 Chronic obstructive pulmonary disease, unspecified: Secondary | ICD-10-CM | POA: Insufficient documentation

## 2020-06-13 DIAGNOSIS — G5601 Carpal tunnel syndrome, right upper limb: Secondary | ICD-10-CM | POA: Diagnosis not present

## 2020-06-13 DIAGNOSIS — E662 Morbid (severe) obesity with alveolar hypoventilation: Secondary | ICD-10-CM | POA: Diagnosis present

## 2020-06-13 DIAGNOSIS — Z888 Allergy status to other drugs, medicaments and biological substances status: Secondary | ICD-10-CM

## 2020-06-13 DIAGNOSIS — Z79899 Other long term (current) drug therapy: Secondary | ICD-10-CM

## 2020-06-13 DIAGNOSIS — G894 Chronic pain syndrome: Secondary | ICD-10-CM | POA: Diagnosis not present

## 2020-06-13 DIAGNOSIS — E1149 Type 2 diabetes mellitus with other diabetic neurological complication: Secondary | ICD-10-CM | POA: Diagnosis present

## 2020-06-13 DIAGNOSIS — J9601 Acute respiratory failure with hypoxia: Secondary | ICD-10-CM | POA: Diagnosis not present

## 2020-06-13 DIAGNOSIS — J45909 Unspecified asthma, uncomplicated: Secondary | ICD-10-CM | POA: Insufficient documentation

## 2020-06-13 DIAGNOSIS — E1151 Type 2 diabetes mellitus with diabetic peripheral angiopathy without gangrene: Secondary | ICD-10-CM | POA: Diagnosis not present

## 2020-06-13 DIAGNOSIS — Z89512 Acquired absence of left leg below knee: Secondary | ICD-10-CM | POA: Diagnosis not present

## 2020-06-13 DIAGNOSIS — Z79891 Long term (current) use of opiate analgesic: Secondary | ICD-10-CM

## 2020-06-13 DIAGNOSIS — R062 Wheezing: Secondary | ICD-10-CM | POA: Diagnosis not present

## 2020-06-13 DIAGNOSIS — J9611 Chronic respiratory failure with hypoxia: Secondary | ICD-10-CM | POA: Diagnosis present

## 2020-06-13 DIAGNOSIS — R0689 Other abnormalities of breathing: Secondary | ICD-10-CM | POA: Diagnosis not present

## 2020-06-13 DIAGNOSIS — G4733 Obstructive sleep apnea (adult) (pediatric): Secondary | ICD-10-CM

## 2020-06-13 MED ORDER — AEROCHAMBER PLUS FLO-VU LARGE MISC: 1.0000 | Freq: Once | Status: DC

## 2020-06-13 MED ORDER — ALBUTEROL SULFATE HFA 108 (90 BASE) MCG/ACT IN AERS
2.0000 | INHALATION_SPRAY | Freq: Once | RESPIRATORY_TRACT | Status: AC
  Administered 2020-06-13: 2 via RESPIRATORY_TRACT
  Filled 2020-06-13: qty 6.7

## 2020-06-13 NOTE — ED Notes (Signed)
Went to speak with pt about discharge and pt states " I need to speak with doctor because I'm not leaving". Will tell MD Sabra Heck

## 2020-06-13 NOTE — Telephone Encounter (Signed)
Dr. Copper at [facility] would like the doctor to a referral for a sleep study consultation with Dr. Golden Hurter at [facility]. Dr. Burt Knack thinks that  The patient needs titration? jw

## 2020-06-13 NOTE — ED Triage Notes (Signed)
Pt seen in the ED at [facility] for [facility] and was discharged home for follow-up with Pulmonologist in August and with inhaler. Within 2 hrs of returning home, pt called EMS for [facility] again.

## 2020-06-13 NOTE — ED Triage Notes (Signed)
Pt back to the ED today after being d/c yesterday , pt had a full workup[ and told to follow up with her MD which told her to come back here

## 2020-06-13 NOTE — Discharge Instructions (Signed)
Overall your testing over time does not show an answer to your symptoms.  You must do better about following a diabetic diet, use the inhaler 2 puffs every 4 hours as needed.  I spoke with the lung specialist today and they are willing to see you in the office.  They should make this rather quickly hopefully you should be seen within the next week.  You may come back to the emergency department for any severe worsening symptoms or any other concerns

## 2020-06-13 NOTE — ED Notes (Signed)
Pt and daughter found an alternate ride home, [facility]AR cancelled. Pt verbalized understanding of d/c instructions.

## 2020-06-13 NOTE — Telephone Encounter (Signed)
Pt calls because she is having some SOB and is crying.   When speaking with her I can hear that she is having trouble breathing.  Advised that she will need to go to the UC or ED given her current state (We have no openings left for today).  She is reluctant because she went the other day and they told her it was her sleep apnea and did not give more explanation.  Read note and asked if she followed-up at at cardiology like indicated by ED.  She was unable to get to appt due to transportation.  Advised that she could call them but if she was unable to be seen today, she needed to be seen by a provider somewhere.   Pt agreeable but reluctant. Christen Bame, CMA

## 2020-06-13 NOTE — ED Provider Notes (Signed)
[facility] EMERGENCY DEPARTMENT Provider Note   CSN: [id number] Arrival date & time: 06/13/20  1050     History No chief complaint on file.   [name] is a 44 y.o. female.  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  Her medical records suggest that she has COPD despite never being a smoker, she has a history of hyperlipidemia, history of SVT in the last year and has had several visits to the emergency department for shortness of breath or chest pain which have ended up with her having a multitude of studies including a CT angiogram which was negative for pulmonary or cardiac or vascular abnormalities, a stress test which was negative and low risk for cardiac disease and multiple chest x-rays.  Most recently she was here yesterday where she was worked up for shortness of breath.  When she called her doctor's office today because she was dyspneic on the phone they told her to come back to the emergency department immediately.  She states that nothing is really changed.  Her shortness of breath is more when she lays down and she tries to correlate this with having a possible mold in her apartment.  She even notes that when she sleeps in her bedroom it is worse than when she sleeps in the main room.  There is no fevers, no significant swelling of the legs, she does endorse having a poor compliance with a diabetic diet, she does take her medications but eats "whenever I want".  She wears her CPAP at night but states that is not helping.  Past Medical History:  Diagnosis Date  . Acute osteomyelitis of ankle or foot, left ([place]) 01/31/2018  . Alveolar hypoventilation   . Anemia    not on iron pill  . Asthma   . Bipolar 2 disorder ([place])   . Carpal tunnel syndrome on right    recurrent  . Cellulitis 08/2010-08/2011  . Chronic pain   . COPD  (chronic obstructive pulmonary disease) ([name])    Symbicort daily and Proventil as needed  . Costochondritis   . Depression   . Diabetes mellitus type II, uncontrolled ([place]) 2000   Type 2, Uncontrolled.Takes Lantus daily.Fasting blood sugar runs 150  . Dizziness    occasionally  . Drug-seeking behavior   . GERD (gastroesophageal reflux disease)    takes Pantoprazole and Zantac daily  . Headache    migraine-last one about a yr ago.Topamax daily  . HLD (hyperlipidemia)    takes Atorvastatin daily  . Hypertension    takes Lisinopril and Coreg daily  . Morbid obesity ([place])   . Muscle spasm    takes Flexeril as needed  . Nocturia   . OSA on CPAP   . Peripheral neuropathy    takes Gabapentin daily  . Pneumonia    "walking" several yrs ago and as a baby (12/05/2018)  . Rectal fissure   . Restless leg   . SVT (supraventricular tachycardia) ([place])   . Syncope 02/25/2016  . Urinary frequency   . Varicose veins    Right medial thigh and Left leg     Patient Active Problem List   Diagnosis Date Noted  . Blister of foot 04/21/2020  . Exposure to mold 04/21/2020  . Constipation 04/03/2020  . Status post amputation of toe of right foot ([place]) 02/13/2020  .  UTI (urinary tract infection) 06/28/2019  . Amputation above knee ([place]) 06/23/2019  . Left shoulder pain 06/23/2019  . Abscess of buttock, right 05/17/2019  . Severe protein-calorie malnutrition ([place])   . 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[place]) 04/11/2019  . Type 2 diabetes mellitus with diabetic polyneuropathy, with long-term current use of insulin ([place]) 04/11/2019  . Uncontrolled type 2 diabetes mellitus with insulin therapy ([place]) 04/16/2018  . Allergic rhinitis 03/06/2018  . Unilateral complete BKA, left, sequela ([place])   . Class 3 severe obesity due to excess calories with serious comorbidity and body mass index (BMI) of 50.0 to 59.9 in adult [facility])   . AKI (acute kidney injury) ([place])   . Decreased pedal pulses 06/10/2017  . Unilateral  primary osteoarthritis, right knee 10/22/2016  . Primary osteoarthritis of first carpometacarpal joint of left hand 07/30/2016  . Diabetic neuropathy ([place]) 07/14/2016  . Nausea 03/17/2016  . De Quervain's tenosynovitis, bilateral 11/01/2015  . Vitamin D deficiency 09/05/2015  . Recurrent candidiasis of vagina 09/05/2015  . Varicose veins of leg with complications 84/16/6063  . Restless leg syndrome 10/17/2014  . Chronic sinusitis 07/18/2014  . Encounter for chronic pain management 06/30/2013  . HLD (hyperlipidemia) 11/19/2012  . Chest pain 06/27/2012  . Right carpal tunnel syndrome 09/01/2011  . Bilateral knee pain 09/01/2011  . DM (diabetes mellitus) type II uncontrolled, periph vascular disorder ([place]) 05/22/2008  . Morbid obesity ([place]) 05/22/2008  . 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 Depression 05/22/2008  . Obstructive sleep apnea 05/22/2008  . Hypertension 05/22/2008  . Asthma 05/22/2008  . GERD 05/22/2008    Past Surgical History:  Procedure Laterality Date  . AMPUTATION Left 02/01/2018   Procedure: LEFT FOURTH AND 5TH TOE RAY AMPUTATION;  Surgeon: [name], MD;  Location: [facility];  Service: Orthopedics;  Laterality: Left;  . AMPUTATION Left 03/03/2018   Procedure: LEFT BELOW KNEE AMPUTATION;  Surgeon: [name], MD;  Location: [facility];  Service: Orthopedics;  Laterality: Left;  . CARPAL TUNNEL RELEASE Bilateral   . CESAREAN SECTION  2007  . INCISION AND DRAINAGE PERIRECTAL ABSCESS Left 05/18/2019   Procedure: IRRIGATION AND DEBRIDEMENT OF PANNIS ABSCESS, POSSIBLE DEBRIDEMENT OF BUTTOCK WOUND;  Surgeon: Donnie Mesa, MD;  Location: [facility];  Service: General;  Laterality: Left;  . IRRIGATION AND DEBRIDEMENT BUTTOCKS Left 05/17/2019   Procedure: IRRIGATION AND DEBRIDEMENT BUTTOCKS;  Surgeon: Donnie Mesa, MD;  Location: [facility];  Service: General;  Laterality: Left;  . KNEE ARTHROSCOPY Right 07/17/2010  . LEFT HEART CATHETERIZATION WITH CORONARY ANGIOGRAM N/A  07/27/2012   Procedure: LEFT HEART CATHETERIZATION WITH CORONARY ANGIOGRAM;  Surgeon: Sherren Mocha, MD;  [facility];  Service: Cardiovascular;  Laterality: N/A;  . MASS EXCISION N/A 06/29/2013   Procedure:  WIDE LOCAL EXCISION OF POSTERIOR NECK ABSCESS;  Surgeon: Ralene Ok, MD;  Location: [facility];  Service: General;  Laterality: N/A;  . REPAIR KNEE LIGAMENT Left    "fixed ligaments and chipped patella"  . right transmetatarsal amputation        OB History    Gravida  2   Para  1   Term      Preterm      AB  1   Living  1     SAB  1   TAB      Ectopic      Multiple      Live Births              Family History  Problem Relation Age of Onset  . Diabetes Mother   . Hyperlipidemia Mother   . Depression Mother   . GER disease Mother   . Allergic rhinitis Mother   . Restless legs syndrome Mother   . Heart attack Paternal Uncle   . Heart disease Paternal Grandmother   . Heart attack Paternal Grandmother   . Heart attack Paternal Grandfather   . Heart disease Paternal Grandfather   . Heart attack Father   . Migraines Sister   . Cancer Maternal Grandmother        COLON  . Hypertension Maternal Grandmother   . Hyperlipidemia Maternal Grandmother   . Diabetes Maternal Grandmother   . Other Maternal Grandfather        GUN SHOT  . Anxiety disorder Sister     Social History   Tobacco Use  . Smoking status: Former Smoker    Packs/day: 0.30    Years: 0.30    Pack years: 0.09    Types: Cigarettes    Quit date: 12/06/1993    Years since quitting: 26.5  . Smokeless tobacco: Never Used  Vaping Use  . Vaping Use: Never used  Substance Use Topics  . Alcohol use: No    Alcohol/week: 0.0 standard drinks  . Drug use: Yes    Comment: OD attempts on home meds      Home Medications Prior to Admission medications   Medication Sig Start Date End Date Taking? Authorizing Provider  Accu-Chek Softclix Lancets lancets Use to check blood glucose four  times daily 04/17/20   Martyn Malay, MD  amLODipine (NORVASC) 10 MG tablet Take 1 tablet (10 mg total) by mouth daily. 05/25/19   [name], MD  atorvastatin (LIPITOR) 40 MG tablet Take 1 tablet (40 mg total) by mouth daily. 04/11/20   [name], MD  budesonide-formoterol [facility]) 160-4.5 MCG/ACT inhaler Inhale 2 puffs into the lungs 2 (two) times daily. 05/09/20   [name], MD  cetirizine (ZYRTEC) 10 MG tablet Take 10 mg by mouth daily as needed for allergies.    [provider]  Cholecalciferol 1000 units tablet Take 1 tablet (1,000 Units total) by mouth daily. 04/05/17   [name], MD  Continuous Blood Gluc Sensor (DEXCOM G6 SENSOR) MISC Inject 1 applicator into the skin as directed. Change sensor every 10 days. 04/10/20   Leavy Cella, RPH-CPP  Continuous Blood Gluc Transmit (DEXCOM G6 TRANSMITTER) MISC Inject 1 Device into the skin as directed. Reuse 8 times with sensor changes. 04/10/20   Leavy Cella, RPH-CPP  cyclobenzaprine (FLEXERIL) 5 MG tablet TAKE 1 TABLET BY MOUTH 3 TIMES DAILY AS NEEDED FOR MUSCLE SPASMS 04/11/20   [name], MD  gabapentin (NEURONTIN) 600 MG tablet TAKE 2 TABLETS BY MOUTH 3 TIMES DAILY 05/09/20   [name], MD  glucose blood (ACCU-CHEK GUIDE) test strip USE T0 CHECK BLOOD SUGAR 3 TIMES DAILY 04/18/20   Leavy Cella, RPH-CPP  HYDROcodone-acetaminophen (NORCO) 10-325 MG tablet TAKE 1 TABLET BY MOUTH EVERY SIX TO EIGHT HOURS AS NEEDED 06/11/20   Lind Covert, MD  insulin isophane & regular human (HUMULIN 70/30 MIX) (70-30) 100 UNIT/ML KwikPen Inject 100 Units into the skin daily with breakfast AND 80 Units daily with supper. 06/12/20   Leavy Cella, RPH-CPP  Insulin Pen Needle (SURE COMFORT PEN NEEDLES) 32G X 4 MM MISC Use to inject insulin as indicated 04/17/20   Martyn Malay, MD  Lancets ([place])  lancets 1 each by Other route as needed for other. Use as instructed 04/18/20   Leavy Cella, RPH-CPP  metoprolol succinate (TOPROL XL) 25 MG 24 hr tablet Take 1 tablet (25 mg total) by mouth daily. 05/28/20 05/23/21  Sherren Mocha, MD  ondansetron (ZOFRAN ODT) 4 MG disintegrating tablet Take 1 tablet (4 mg total) by mouth every 8 (eight) hours as needed for nausea or vomiting. 03/26/20   Albrizze, Verline Lema E, PA-C  pantoprazole (PROTONIX) 40 MG tablet Take 1 tablet (40 mg total) by mouth daily. 03/16/18   Angiulli, Lavon Paganini, PA-C  PROVENTIL HFA 108 (90 Base) MCG/ACT inhaler Inhale 1-2 puffs into the lungs every 6 (six) hours as needed for wheezing or shortness of breath. Patient taking differently: Inhale 2 puffs into the lungs every 4 (four) hours as needed for wheezing.  05/11/19   [name], MD  rOPINIRole (REQUIP) 1 MG tablet Take 1 tablet (1 mg total) by mouth at bedtime. 04/03/20   [name], MD  terconazole (TERAZOL 7) 0.4 % vaginal cream Place 1 applicator vaginally at bedtime. 04/03/20   [name], MD  VICTOZA 18 MG/3ML SOPN inject 0.6 MG SUBCUTANEOUSLY ONCE A DAY FOR 1 WEEK, THEN INCREASE TO 1.2 MG ONCE daily max 1.8 MG 04/16/20   [name], MD    Allergies    Cefepime, Kiwi extract, Morphine and related, Trental [pentoxifylline], and Nubain [nalbuphine hcl]  Review of Systems   Review of Systems  All other systems reviewed and are negative.   Physical Exam Updated Vital Signs BP 111/71 (BP Location: Left Arm)   Pulse 88   Temp 98.7 F (37.1 C) (Oral)   Resp (!) 22   Ht 1.575 m (5\' 2" )   Wt (!) 151.5 kg   LMP 05/15/2020   SpO2 99%   BMI 61.09 kg/m   Physical Exam Vitals and nursing note reviewed.  Constitutional:      General: She is not in acute distress.    Appearance: She is well-developed.  HENT:     Head: Normocephalic and atraumatic.     Mouth/Throat:     Pharynx: No oropharyngeal exudate.  Eyes:     General: No scleral icterus.       Right eye: No discharge.        Left eye: No discharge.      Conjunctiva/sclera: Conjunctivae normal.     Pupils: Pupils are equal, round, and reactive to light.  Neck:     Thyroid: No thyromegaly.     Vascular: No JVD.  Cardiovascular:     Rate and Rhythm: Normal rate and regular rhythm.     Heart sounds: Normal heart sounds. No murmur heard.  No friction rub. No gallop.   Pulmonary:     Effort: Pulmonary effort is normal. No respiratory distress.     Breath sounds: Normal breath sounds. No wheezing or rales.     Comments: Mild tachypnea, clear lung sounds, speaks in full sentences Abdominal:     General: Bowel sounds are normal. There is no distension.     Palpations: Abdomen is soft. There is no mass.     Tenderness: There is no abdominal tenderness.     Comments: Morbidly obese but soft and nontender abdomen  Musculoskeletal:        General: No tenderness. Normal range of motion.     Cervical back: Normal range of motion and neck supple.     Comments: Lower extremity amputations  as described in the history  Lymphadenopathy:     Cervical: No cervical adenopathy.  Skin:    General: Skin is warm and dry.     Findings: No erythema or rash.  Neurological:     Mental Status: She is alert.     Coordination: Coordination normal.     Comments: Awake alert and following commands  Psychiatric:        Behavior: Behavior normal.     ED Results / Procedures / Treatments   Labs (all labs ordered are listed, but only abnormal results are displayed) Labs Reviewed - No data to display  EKG EKG Interpretation  Date/Time:  Friday June 13 2020 12:57:44 EDT Ventricular Rate:  85 PR Interval:  164 QRS Duration: 94 QT Interval:  372 QTC Calculation: 442 R Axis:   64 Text Interpretation: Normal sinus rhythm Low voltage QRS Cannot rule out Anterior infarct , age undetermined Abnormal ECG since last tracing no significant change Confirmed by Noemi Chapel [phone]) on 06/13/2020 2:30:36 PM   Radiology DG Chest 2 View  Result Date:  06/12/2020 CLINICAL DATA:  Chest pain. EXAM: CHEST - 2 VIEW COMPARISON:  05/29/2020. FINDINGS: Heart size stable. Low lung volumes with bibasilar atelectasis. Tiny bilateral pleural effusions cannot be excluded. No pneumothorax. IMPRESSION: Low lung volumes with bibasilar atelectasis. Tiny bilateral pleural effusions cannot be excluded. Electronically Signed   By: Marcello Moores  Register   On: 06/12/2020 06:30    Procedures Procedures (including critical care time)  Medications Ordered in ED Medications  albuterol (VENTOLIN HFA) 108 (90 Base) MCG/ACT inhaler 2 puff (has no administration in time range)  AeroChamber Plus Flo-Vu Large MISC 1 each (has no administration in time range)    ED Course  I have reviewed the triage vital signs and the nursing notes.  Pertinent labs & imaging results that were available during my care of the patient were reviewed by me and considered in my medical decision making (see chart for details).    MDM Rules/Calculators/A&P                          I will repeat the patient's EKG, I will give her an albuterol inhaler for home as she has been using her daughters.  I have discussed the case with the pulmonary on-call provider who recommends that she be seen in the office and recommends an ambulatory referral.  She will contact the office to make sure that this happens in an urgent fashion.  The patient is agreeable to this plan.  I do not think she needs any more aggressive testing today as she has already had a very thorough work-up up to this point.  She is aware of the multitude of negative findings thus far and is reassured but still frustrated in her lack of improvement.  I suspect that her shortness of breath is multifactorial including obesity, chronic hypoventilation, chronic acute respiratory failure, poorly controlled diabetes, deconditioning etc.  dW PUlm - agree to see in the office - ambulatory referral made No other testing necessary at this time.  Final  Clinical Impression(s) / ED Diagnoses Final diagnoses:  Dyspnea, unspecified type    Rx / DC Orders ED Discharge Orders         Ordered    Ambulatory referral to Pulmonology     Discontinue  Reprint     06/13/20 1245           Noemi Chapel, MD 06/13/20 1431

## 2020-06-13 NOTE — ED Notes (Signed)
Ptar called pt is number 6 on the list ?

## 2020-06-13 NOTE — ED Notes (Signed)
Dr. Sabra Heck and Janett Billow, RN at bedside with pt to discuss d/c.

## 2020-06-14 ENCOUNTER — Emergency Department (HOSPITAL_COMMUNITY): Payer: Medicaid Other

## 2020-06-14 ENCOUNTER — Other Ambulatory Visit (HOSPITAL_COMMUNITY): Payer: Medicaid Other

## 2020-06-14 ENCOUNTER — Other Ambulatory Visit: Payer: Self-pay

## 2020-06-14 ENCOUNTER — Encounter (HOSPITAL_COMMUNITY): Payer: Self-pay | Admitting: *Deleted

## 2020-06-14 DIAGNOSIS — S78119A Complete traumatic amputation at level between unspecified hip and knee, initial encounter: Secondary | ICD-10-CM | POA: Diagnosis not present

## 2020-06-14 DIAGNOSIS — E1151 Type 2 diabetes mellitus with diabetic peripheral angiopathy without gangrene: Secondary | ICD-10-CM

## 2020-06-14 DIAGNOSIS — R0602 Shortness of breath: Secondary | ICD-10-CM | POA: Diagnosis not present

## 2020-06-14 DIAGNOSIS — E1140 Type 2 diabetes mellitus with diabetic neuropathy, unspecified: Secondary | ICD-10-CM | POA: Diagnosis not present

## 2020-06-14 DIAGNOSIS — E1165 Type 2 diabetes mellitus with hyperglycemia: Secondary | ICD-10-CM | POA: Diagnosis not present

## 2020-06-14 DIAGNOSIS — I10 Essential (primary) hypertension: Secondary | ICD-10-CM

## 2020-06-14 DIAGNOSIS — E662 Morbid (severe) obesity with alveolar hypoventilation: Secondary | ICD-10-CM | POA: Diagnosis not present

## 2020-06-14 DIAGNOSIS — J9601 Acute respiratory failure with hypoxia: Secondary | ICD-10-CM | POA: Diagnosis not present

## 2020-06-14 DIAGNOSIS — Z6841 Body Mass Index (BMI) 40.0 and over, adult: Secondary | ICD-10-CM

## 2020-06-14 DIAGNOSIS — J9611 Chronic respiratory failure with hypoxia: Secondary | ICD-10-CM | POA: Diagnosis present

## 2020-06-14 LAB — CBC WITH DIFFERENTIAL/PLATELET
Abs Immature Granulocytes: 0.07 10*3/uL (ref 0.00–0.07)
Basophils Absolute: 0.1 10*3/uL (ref 0.0–0.1)
Basophils Relative: 0 %
Eosinophils Absolute: 0.2 10*3/uL (ref 0.0–0.5)
Eosinophils Relative: 2 %
HCT: 36.9 % (ref 36.0–46.0)
Hemoglobin: 11.4 g/dL — ABNORMAL LOW (ref 12.0–15.0)
Immature Granulocytes: 1 %
Lymphocytes Relative: 20 %
Lymphs Abs: 2.7 10*3/uL (ref 0.7–4.0)
MCH: 29.1 pg (ref 26.0–34.0)
MCHC: 30.9 g/dL (ref 30.0–36.0)
MCV: 94.1 fL (ref 80.0–100.0)
Monocytes Absolute: 0.6 10*3/uL (ref 0.1–1.0)
Monocytes Relative: 4 %
Neutro Abs: 9.8 10*3/uL — ABNORMAL HIGH (ref 1.7–7.7)
Neutrophils Relative %: 73 %
Platelets: 359 10*3/uL (ref 150–400)
RBC: 3.92 MIL/uL (ref 3.87–5.11)
RDW: 13.7 % (ref 11.5–15.5)
WBC: 13.4 10*3/uL — ABNORMAL HIGH (ref 4.0–10.5)
nRBC: 0 % (ref 0.0–0.2)

## 2020-06-14 LAB — COMPREHENSIVE METABOLIC PANEL
ALT: 16 U/L (ref 0–44)
AST: 12 U/L — ABNORMAL LOW (ref 15–41)
Albumin: 3.4 g/dL — ABNORMAL LOW (ref 3.5–5.0)
Alkaline Phosphatase: 98 U/L (ref 38–126)
Anion gap: 13 (ref 5–15)
BUN: 29 mg/dL — ABNORMAL HIGH (ref 6–20)
CO2: 24 mmol/L (ref 22–32)
Calcium: 8.7 mg/dL — ABNORMAL LOW (ref 8.9–10.3)
Chloride: 100 mmol/L (ref 98–111)
Creatinine, Ser: 1.19 mg/dL — ABNORMAL HIGH (ref 0.44–1.00)
GFR calc Af Amer: >60 mL/min (ref >60)
GFR calc non Af Amer: 56 mL/min — ABNORMAL LOW (ref >60)
Glucose, Bld: 353 mg/dL — ABNORMAL HIGH (ref 70–99)
Potassium: 3.8 mmol/L (ref 3.5–5.1)
Sodium: 137 mmol/L (ref 135–145)
Total Bilirubin: 0.3 mg/dL (ref 0.3–1.2)
Total Protein: 8.1 g/dL (ref 6.5–8.1)

## 2020-06-14 LAB — GLUCOSE, CAPILLARY
Glucose-Capillary: 474 mg/dL — ABNORMAL HIGH (ref 70–99)
Glucose-Capillary: 358 mg/dL — ABNORMAL HIGH (ref 70–99)
Glucose-Capillary: 354 mg/dL — ABNORMAL HIGH (ref 70–99)

## 2020-06-14 LAB — TROPONIN I (HIGH SENSITIVITY)
Troponin I (High Sensitivity): 11 ng/L
Troponin I (High Sensitivity): 13 ng/L (ref <18)

## 2020-06-14 LAB — HEMOGLOBIN A1C
Hgb A1c MFr Bld: 12.7 % — ABNORMAL HIGH (ref 4.8–5.6)
Mean Plasma Glucose: 317.79 mg/dL

## 2020-06-14 LAB — HIV ANTIBODY (ROUTINE TESTING W REFLEX): HIV Screen 4th Generation wRfx: NONREACTIVE

## 2020-06-14 LAB — CBG MONITORING, ED: Glucose-Capillary: 571 mg/dL (ref 70–99)

## 2020-06-14 LAB — SARS CORONAVIRUS 2 BY RT PCR (HOSPITAL ORDER, PERFORMED IN ~~LOC~~ HOSPITAL LAB): SARS Coronavirus 2: NEGATIVE

## 2020-06-14 LAB — BRAIN NATRIURETIC PEPTIDE: B Natriuretic Peptide: 57.1 pg/mL (ref 0.0–100.0)

## 2020-06-14 MED ORDER — MOMETASONE FURO-FORMOTEROL FUM 200-5 MCG/ACT IN AERO
2.0000 | INHALATION_SPRAY | Freq: Two times a day (BID) | RESPIRATORY_TRACT | Status: DC
  Administered 2020-06-14 – 2020-06-15 (×2): 2 via RESPIRATORY_TRACT
  Filled 2020-06-14: qty 8.8

## 2020-06-14 MED ORDER — ALBUTEROL SULFATE (2.5 MG/3ML) 0.083% IN NEBU: 2.5000 mg | INHALATION_SOLUTION | RESPIRATORY_TRACT | Status: DC | PRN

## 2020-06-14 MED ORDER — SODIUM CHLORIDE (PF) 0.9 % IJ SOLN
INTRAMUSCULAR | Status: AC
  Filled 2020-06-14: qty 50

## 2020-06-14 MED ORDER — HYDROMORPHONE HCL 1 MG/ML IJ SOLN
1.0000 mg | Freq: Once | INTRAMUSCULAR | Status: AC
  Administered 2020-06-14: 1 mg via INTRAVENOUS
  Filled 2020-06-14: qty 1

## 2020-06-14 MED ORDER — ONDANSETRON HCL 4 MG/2ML IJ SOLN
4.0000 mg | Freq: Four times a day (QID) | INTRAMUSCULAR | Status: DC | PRN
  Administered 2020-06-14: 4 mg via INTRAVENOUS
  Filled 2020-06-14: qty 2

## 2020-06-14 MED ORDER — ACETAMINOPHEN 650 MG RE SUPP: 650.0000 mg | Freq: Four times a day (QID) | RECTAL | Status: DC | PRN

## 2020-06-14 MED ORDER — IOHEXOL 350 MG/ML SOLN
100.0000 mL | Freq: Once | INTRAVENOUS | Status: AC | PRN
  Administered 2020-06-14: 100 mL via INTRAVENOUS

## 2020-06-14 MED ORDER — AMLODIPINE BESYLATE 10 MG PO TABS
10.0000 mg | ORAL_TABLET | Freq: Every day | ORAL | Status: DC
  Administered 2020-06-14 – 2020-06-15 (×2): 10 mg via ORAL
  Filled 2020-06-14 (×3): qty 1

## 2020-06-14 MED ORDER — CYCLOBENZAPRINE HCL 5 MG PO TABS
5.0000 mg | ORAL_TABLET | Freq: Three times a day (TID) | ORAL | Status: DC | PRN
  Administered 2020-06-14: 5 mg via ORAL
  Filled 2020-06-14: qty 1

## 2020-06-14 MED ORDER — INSULIN NPH (HUMAN) (ISOPHANE) 100 UNIT/ML ~~LOC~~ SUSP
60.0000 [IU] | Freq: Two times a day (BID) | SUBCUTANEOUS | Status: DC
  Filled 2020-06-14: qty 10

## 2020-06-14 MED ORDER — PANTOPRAZOLE SODIUM 40 MG PO TBEC
40.0000 mg | DELAYED_RELEASE_TABLET | Freq: Every day | ORAL | Status: DC
  Administered 2020-06-14 – 2020-06-15 (×2): 40 mg via ORAL
  Filled 2020-06-14 (×2): qty 1

## 2020-06-14 MED ORDER — INSULIN ISOPHANE & REGULAR (HUMAN 70-30)100 UNIT/ML KWIKPEN: 80.0000 [IU] | PEN_INJECTOR | Freq: Two times a day (BID) | SUBCUTANEOUS | Status: DC

## 2020-06-14 MED ORDER — INSULIN GLARGINE 100 UNIT/ML ~~LOC~~ SOLN
40.0000 [IU] | Freq: Two times a day (BID) | SUBCUTANEOUS | Status: DC
  Administered 2020-06-14 – 2020-06-15 (×3): 40 [IU] via SUBCUTANEOUS
  Filled 2020-06-14 (×4): qty 0.4

## 2020-06-14 MED ORDER — INSULIN ASPART 100 UNIT/ML ~~LOC~~ SOLN
10.0000 [IU] | Freq: Three times a day (TID) | SUBCUTANEOUS | Status: DC
  Administered 2020-06-14 – 2020-06-15 (×4): 10 [IU] via SUBCUTANEOUS

## 2020-06-14 MED ORDER — INSULIN ASPART 100 UNIT/ML ~~LOC~~ SOLN
0.0000 [IU] | Freq: Three times a day (TID) | SUBCUTANEOUS | Status: DC
  Administered 2020-06-14 (×2): 20 [IU] via SUBCUTANEOUS
  Administered 2020-06-15 (×3): 7 [IU] via SUBCUTANEOUS
  Filled 2020-06-14: qty 0.2

## 2020-06-14 MED ORDER — ROPINIROLE HCL 1 MG PO TABS
1.0000 mg | ORAL_TABLET | Freq: Every day | ORAL | Status: DC
  Administered 2020-06-14: 1 mg via ORAL
  Filled 2020-06-14: qty 1

## 2020-06-14 MED ORDER — ONDANSETRON HCL 4 MG PO TABS: 4.0000 mg | ORAL_TABLET | Freq: Four times a day (QID) | ORAL | Status: DC | PRN

## 2020-06-14 MED ORDER — ONDANSETRON HCL 4 MG/2ML IJ SOLN: 4.0000 mg | Freq: Four times a day (QID) | INTRAMUSCULAR | Status: DC | PRN

## 2020-06-14 MED ORDER — ORAL CARE MOUTH RINSE
15.0000 mL | Freq: Two times a day (BID) | OROMUCOSAL | Status: DC
  Administered 2020-06-14 – 2020-06-15 (×2): 15 mL via OROMUCOSAL

## 2020-06-14 MED ORDER — INSULIN ASPART 100 UNIT/ML ~~LOC~~ SOLN
20.0000 [IU] | Freq: Three times a day (TID) | SUBCUTANEOUS | Status: DC
  Administered 2020-06-14: 20 [IU] via SUBCUTANEOUS
  Filled 2020-06-14: qty 0.2

## 2020-06-14 MED ORDER — GABAPENTIN 300 MG PO CAPS
600.0000 mg | ORAL_CAPSULE | Freq: Three times a day (TID) | ORAL | Status: DC
  Administered 2020-06-14 – 2020-06-15 (×5): 600 mg via ORAL
  Filled 2020-06-14 (×5): qty 2

## 2020-06-14 MED ORDER — LIRAGLUTIDE 18 MG/3ML ~~LOC~~ SOPN: 0.6000 mg | PEN_INJECTOR | Freq: Every day | SUBCUTANEOUS | Status: DC

## 2020-06-14 MED ORDER — METOPROLOL SUCCINATE ER 25 MG PO TB24
25.0000 mg | ORAL_TABLET | Freq: Every day | ORAL | Status: DC
  Administered 2020-06-14 – 2020-06-15 (×2): 25 mg via ORAL
  Filled 2020-06-14 (×3): qty 1

## 2020-06-14 MED ORDER — ACETAMINOPHEN 325 MG PO TABS
650.0000 mg | ORAL_TABLET | Freq: Four times a day (QID) | ORAL | Status: DC | PRN
  Administered 2020-06-14: 650 mg via ORAL
  Filled 2020-06-14: qty 2

## 2020-06-14 MED ORDER — ATORVASTATIN CALCIUM 40 MG PO TABS
40.0000 mg | ORAL_TABLET | Freq: Every day | ORAL | Status: DC
  Administered 2020-06-14 – 2020-06-15 (×2): 40 mg via ORAL
  Filled 2020-06-14 (×2): qty 1

## 2020-06-14 MED ORDER — VITAMIN D3 25 MCG (1000 UNIT) PO TABS
1000.0000 [IU] | ORAL_TABLET | Freq: Every day | ORAL | Status: DC
  Administered 2020-06-14 – 2020-06-15 (×2): 1000 [IU] via ORAL
  Filled 2020-06-14 (×3): qty 1

## 2020-06-14 MED ORDER — ENOXAPARIN SODIUM 80 MG/0.8ML ~~LOC~~ SOLN
75.0000 mg | SUBCUTANEOUS | Status: DC
  Administered 2020-06-14 – 2020-06-15 (×2): 75 mg via SUBCUTANEOUS
  Filled 2020-06-14: qty 0.8
  Filled 2020-06-14: qty 0.75
  Filled 2020-06-14: qty 0.8

## 2020-06-14 MED ORDER — INSULIN ASPART 100 UNIT/ML ~~LOC~~ SOLN
6.0000 [IU] | Freq: Three times a day (TID) | SUBCUTANEOUS | Status: DC
  Administered 2020-06-14: 6 [IU] via SUBCUTANEOUS
  Filled 2020-06-14: qty 0.06

## 2020-06-14 NOTE — ED Notes (Signed)
MD advised to give patient 20U novolog - 6 already given, given additional 14 now.

## 2020-06-14 NOTE — ED Provider Notes (Signed)
[facility] DEPT Provider Note   CSN: [id number] Arrival date & time: 06/13/20  2340     History Chief Complaint  Patient presents with  . Respiratory Distress    [name] is a 44 y.o. female.  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  Patient presents today for evaluation of shortness of breath.  This has been persistent over the past several weeks.  Patient has been seen in the ER on multiple occasions, however no definitive diagnosis has been found.  She was seen less than 6 hours ago with similar complaints.  Shortly after returning home, she became short of breath again, then called EMS.  Patient was found to have oxygen saturations in the upper 80s and was placed on oxygen by nasal cannula.  She denies to me she is having any fevers or chills.  She does describe some pressure in her chest.  The history is provided by the patient.       Past Medical History:  Diagnosis Date  . Acute osteomyelitis of ankle or foot, left ([place]) 01/31/2018  . Alveolar hypoventilation   . Anemia    not on iron pill  . Asthma   . Bipolar 2 disorder ([place])   . Carpal tunnel syndrome on right    recurrent  . Cellulitis 08/2010-08/2011  . Chronic pain   . COPD (chronic obstructive pulmonary disease) ([name])    Symbicort daily and Proventil as needed  . Costochondritis   . Depression   . Diabetes mellitus type II, uncontrolled ([place]) 2000   Type 2, Uncontrolled.Takes Lantus daily.Fasting blood sugar runs 150  . Dizziness    occasionally  . Drug-seeking behavior   . GERD (gastroesophageal reflux disease)    takes Pantoprazole and Zantac daily  . Headache    migraine-last one about a yr ago.Topamax daily  . HLD (hyperlipidemia)    takes Atorvastatin daily  . Hypertension    takes Lisinopril and Coreg daily  .  Morbid obesity ([place])   . Muscle spasm    takes Flexeril as needed  . Nocturia   . OSA on CPAP   . Peripheral neuropathy    takes Gabapentin daily  . Pneumonia    "walking" several yrs ago and as a baby (12/05/2018)  . Rectal fissure   . Restless leg   . SVT (supraventricular tachycardia) ([place])   . Syncope 02/25/2016  . Urinary frequency   . Varicose veins    Right medial thigh and Left leg     Patient Active Problem List   Diagnosis Date Noted  . Blister of foot 04/21/2020  . Exposure to mold 04/21/2020  . Constipation 04/03/2020  . Status post amputation of toe of right foot ([place]) 02/13/2020  . UTI (urinary tract infection) 06/28/2019  . Amputation above knee ([place]) 06/23/2019  . Left shoulder pain 06/23/2019  . Abscess of buttock, right 05/17/2019  . Severe protein-calorie malnutrition ([place])   . 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[place]) 04/11/2019  . Type 2 diabetes mellitus with diabetic polyneuropathy, with long-term current use of insulin ([place]) 04/11/2019  . Uncontrolled type 2 diabetes mellitus with insulin therapy ([place]) 04/16/2018  . Allergic rhinitis 03/06/2018  . Unilateral complete BKA, left, sequela ([place])   . Class 3 severe obesity due to excess calories with serious comorbidity and body mass index (BMI) of 50.0 to 59.9 in adult [facility])   . AKI (  acute kidney injury) ([place])   . Decreased pedal pulses 06/10/2017  . Unilateral primary osteoarthritis, right knee 10/22/2016  . Primary osteoarthritis of first carpometacarpal joint of left hand 07/30/2016  . Diabetic neuropathy ([place]) 07/14/2016  . Nausea 03/17/2016  . De Quervain's tenosynovitis, bilateral 11/01/2015  . Vitamin D deficiency 09/05/2015  . Recurrent candidiasis of vagina 09/05/2015  . Varicose veins of leg with complications 56/21/3086  . Restless leg syndrome 10/17/2014  . Chronic sinusitis 07/18/2014  . Encounter for chronic pain management 06/30/2013  . HLD (hyperlipidemia) 11/19/2012  . Chest pain 06/27/2012  .  Right carpal tunnel syndrome 09/01/2011  . Bilateral knee pain 09/01/2011  . DM (diabetes mellitus) type II uncontrolled, periph vascular disorder ([place]) 05/22/2008  . Morbid obesity ([place]) 05/22/2008  . 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 Depression 05/22/2008  . Obstructive sleep apnea 05/22/2008  . Hypertension 05/22/2008  . Asthma 05/22/2008  . GERD 05/22/2008    Past Surgical History:  Procedure Laterality Date  . AMPUTATION Left 02/01/2018   Procedure: LEFT FOURTH AND 5TH TOE RAY AMPUTATION;  Surgeon: [name], MD;  Location: [facility];  Service: Orthopedics;  Laterality: Left;  . AMPUTATION Left 03/03/2018   Procedure: LEFT BELOW KNEE AMPUTATION;  Surgeon: [name], MD;  Location: [facility];  Service: Orthopedics;  Laterality: Left;  . CARPAL TUNNEL RELEASE Bilateral   . CESAREAN SECTION  2007  . INCISION AND DRAINAGE PERIRECTAL ABSCESS Left 05/18/2019   Procedure: IRRIGATION AND DEBRIDEMENT OF PANNIS ABSCESS, POSSIBLE DEBRIDEMENT OF BUTTOCK WOUND;  Surgeon: Donnie Mesa, MD;  Location: [facility];  Service: General;  Laterality: Left;  . IRRIGATION AND DEBRIDEMENT BUTTOCKS Left 05/17/2019   Procedure: IRRIGATION AND DEBRIDEMENT BUTTOCKS;  Surgeon: Donnie Mesa, MD;  Location: [facility];  Service: General;  Laterality: Left;  . KNEE ARTHROSCOPY Right 07/17/2010  . LEFT HEART CATHETERIZATION WITH CORONARY ANGIOGRAM N/A 07/27/2012   Procedure: LEFT HEART CATHETERIZATION WITH CORONARY ANGIOGRAM;  Surgeon: Sherren Mocha, MD;  Location: [facility] CATH LAB;  Service: Cardiovascular;  Laterality: N/A;  . MASS EXCISION N/A 06/29/2013   Procedure:  WIDE LOCAL EXCISION OF POSTERIOR NECK ABSCESS;  Surgeon: Ralene Ok, MD;  Location: [facility];  Service: General;  Laterality: N/A;  . REPAIR KNEE LIGAMENT Left    "fixed ligaments and chipped patella"  . right transmetatarsal amputation        OB History    Gravida  2   Para  1   Term      Preterm      AB  1   Living  1     SAB    1   TAB      Ectopic      Multiple      Live Births              Family History  Problem Relation Age of Onset  . Diabetes Mother   . Hyperlipidemia Mother   . Depression Mother   . GER disease Mother   . Allergic rhinitis Mother   . Restless legs syndrome Mother   . Heart attack Paternal Uncle   . Heart disease Paternal Grandmother   . Heart attack Paternal Grandmother   . Heart attack Paternal Grandfather   . Heart disease Paternal Grandfather   . Heart attack Father   . Migraines Sister   . Cancer Maternal Grandmother        COLON  . Hypertension Maternal Grandmother   . Hyperlipidemia Maternal Grandmother   .  Diabetes Maternal Grandmother   . Other Maternal Grandfather        GUN SHOT  . Anxiety disorder Sister     Social History   Tobacco Use  . Smoking status: Former Smoker    Packs/day: 0.30    Years: 0.30    Pack years: 0.09    Types: Cigarettes    Quit date: 12/06/1993    Years since quitting: 26.5  . Smokeless tobacco: Never Used  Vaping Use  . Vaping Use: Never used  Substance Use Topics  . Alcohol use: No    Alcohol/week: 0.0 standard drinks  . Drug use: Yes    Comment: OD attempts on home meds      Home Medications Prior to Admission medications   Medication Sig Start Date End Date Taking? Authorizing Provider  Accu-Chek Softclix Lancets lancets Use to check blood glucose four times daily 04/17/20   Martyn Malay, MD  amLODipine (NORVASC) 10 MG tablet Take 1 tablet (10 mg total) by mouth daily. 05/25/19   [name], MD  atorvastatin (LIPITOR) 40 MG tablet Take 1 tablet (40 mg total) by mouth daily. 04/11/20   [name], MD  budesonide-formoterol [facility]) 160-4.5 MCG/ACT inhaler Inhale 2 puffs into the lungs 2 (two) times daily. 05/09/20   [name], MD  cetirizine (ZYRTEC) 10 MG tablet Take 10 mg by mouth daily as needed for allergies.    [provider]  Cholecalciferol 1000 units tablet Take 1 tablet  (1,000 Units total) by mouth daily. 04/05/17   [name], MD  Continuous Blood Gluc Sensor (DEXCOM G6 SENSOR) MISC Inject 1 applicator into the skin as directed. Change sensor every 10 days. 04/10/20   Leavy Cella, RPH-CPP  Continuous Blood Gluc Transmit (DEXCOM G6 TRANSMITTER) MISC Inject 1 Device into the skin as directed. Reuse 8 times with sensor changes. 04/10/20   Leavy Cella, RPH-CPP  cyclobenzaprine (FLEXERIL) 5 MG tablet TAKE 1 TABLET BY MOUTH 3 TIMES DAILY AS NEEDED FOR MUSCLE SPASMS 04/11/20   [name], MD  gabapentin (NEURONTIN) 600 MG tablet TAKE 2 TABLETS BY MOUTH 3 TIMES DAILY 05/09/20   [name], MD  glucose blood (ACCU-CHEK GUIDE) test strip USE T0 CHECK BLOOD SUGAR 3 TIMES DAILY 04/18/20   Leavy Cella, RPH-CPP  HYDROcodone-acetaminophen (NORCO) 10-325 MG tablet TAKE 1 TABLET BY MOUTH EVERY SIX TO EIGHT HOURS AS NEEDED 06/11/20   Lind Covert, MD  insulin isophane & regular human (HUMULIN 70/30 MIX) (70-30) 100 UNIT/ML KwikPen Inject 100 Units into the skin daily with breakfast AND 80 Units daily with supper. 06/12/20   Leavy Cella, RPH-CPP  Insulin Pen Needle (SURE COMFORT PEN NEEDLES) 32G X 4 MM MISC Use to inject insulin as indicated 04/17/20   Martyn Malay, MD  Lancets (ACCU-CHEK MULTICLIX) lancets 1 each by Other route as needed for other. Use as instructed 04/18/20   Leavy Cella, RPH-CPP  metoprolol succinate (TOPROL XL) 25 MG 24 hr tablet Take 1 tablet (25 mg total) by mouth daily. 05/28/20 05/23/21  Sherren Mocha, MD  ondansetron (ZOFRAN ODT) 4 MG disintegrating tablet Take 1 tablet (4 mg total) by mouth every 8 (eight) hours as needed for nausea or vomiting. 03/26/20   Albrizze, Verline Lema E, PA-C  pantoprazole (PROTONIX) 40 MG tablet Take 1 tablet (40 mg total) by mouth daily. 03/16/18   Angiulli, Lavon Paganini, PA-C  PROVENTIL HFA 108 (90 Base) MCG/ACT inhaler Inhale 1-2 puffs into  the lungs every 6 (six) hours as needed for wheezing or  shortness of breath. Patient taking differently: Inhale 2 puffs into the lungs every 4 (four) hours as needed for wheezing.  05/11/19   [name], MD  rOPINIRole (REQUIP) 1 MG tablet Take 1 tablet (1 mg total) by mouth at bedtime. 04/03/20   [name], MD  terconazole (TERAZOL 7) 0.4 % vaginal cream Place 1 applicator vaginally at bedtime. 04/03/20   [name], MD  VICTOZA 18 MG/3ML SOPN inject 0.6 MG SUBCUTANEOUSLY ONCE A DAY FOR 1 WEEK, THEN INCREASE TO 1.2 MG ONCE daily max 1.8 MG 04/16/20   [name], MD    Allergies    Cefepime, Kiwi extract, Morphine and related, Trental [pentoxifylline], and Nubain [nalbuphine hcl]  Review of Systems   Review of Systems  All other systems reviewed and are negative.   Physical Exam Updated Vital Signs BP 136/80 (BP Location: Right Arm)   Temp 97.6 F (36.4 C) (Oral)   Resp 19   Ht 5\' 2"  (1.575 m)   Wt (!) 151.5 kg   LMP 05/15/2020   SpO2 98%   BMI 61.09 kg/m   Physical Exam Vitals and nursing note reviewed.  Constitutional:      General: She is not in acute distress.    Appearance: She is well-developed. She is not diaphoretic.  HENT:     Head: Normocephalic and atraumatic.  Cardiovascular:     Rate and Rhythm: Normal rate and regular rhythm.     Heart sounds: No murmur heard.  No friction rub. No gallop.   Pulmonary:     Effort: Pulmonary effort is normal. No respiratory distress.     Breath sounds: Normal breath sounds. No wheezing.  Abdominal:     General: Bowel sounds are normal. There is no distension.     Palpations: Abdomen is soft.     Tenderness: There is no abdominal tenderness.  Musculoskeletal:        General: Normal range of motion.     Cervical back: Normal range of motion and neck supple.     Right lower leg: No edema.     Comments: Status post left BTA  Skin:    General: Skin is warm and dry.  Neurological:     Mental Status: She is alert and oriented to person, place,  and time.     ED Results / Procedures / Treatments   Labs (all labs ordered are listed, but only abnormal results are displayed) Labs Reviewed  COMPREHENSIVE METABOLIC PANEL  BRAIN NATRIURETIC PEPTIDE  CBC WITH DIFFERENTIAL/PLATELET  TROPONIN I (HIGH SENSITIVITY)    EKG EKG Interpretation  Date/Time:  Saturday June 14 2020 00:45:07 EDT Ventricular Rate:  108 PR Interval:    QRS Duration: 96 QT Interval:  354 QTC Calculation: 475 R Axis:   89 Text Interpretation: Sinus tachycardia Low voltage, precordial leads Probable anterolateral infarct, old Confirmed by Veryl Speak [phone]) on 06/14/2020 3:09:50 AM   Radiology DG Chest 2 View  Result Date: 06/12/2020 CLINICAL DATA:  Chest pain. EXAM: CHEST - 2 VIEW COMPARISON:  05/29/2020. FINDINGS: Heart size stable. Low lung volumes with bibasilar atelectasis. Tiny bilateral pleural effusions cannot be excluded. No pneumothorax. IMPRESSION: Low lung volumes with bibasilar atelectasis. Tiny bilateral pleural effusions cannot be excluded. Electronically Signed   By: Marcello Moores  Register   On: 06/12/2020 06:30    Procedures Procedures (including critical care time)  Medications Ordered in ED Medications - No  data to display  ED Course  I have reviewed the triage vital signs and the nursing notes.  Pertinent labs & imaging results that were available during my care of the patient were reviewed by me and considered in my medical decision making (see chart for details).    MDM Rules/Calculators/A&P  Patient with extensive past medical history including diabetes, morbid obesity presenting with complaints of shortness of breath.  This is worsening over the past several weeks.  She has been seen here on multiple occasions with similar complaints, most recently earlier today.  She was seen at [facility], then discharged but returns stating that she is continuing to have difficulty breathing.  She was placed on oxygen by EMS and is feeling  better.  Patient's vitals are stable here in the ER.  Her oxygen saturations do drop to the upper 80s while taken off of oxygen.  Her laboratory studies are basically unchanged from prior studies and CTA of the chest shows no evidence for PE.  Her BNP is normal, and I believe CHF is unlikely.  I suspect most likely cause of her dyspnea and hypoxia is related to obesity hypoventilation syndrome.  I have discussed the care with Dr. Alcario Drought who will evaluate and admit.  Covid test pending.  Final Clinical Impression(s) / ED Diagnoses Final diagnoses:  None    Rx / DC Orders ED Discharge Orders    None       Veryl Speak, MD 06/14/20 [phone]

## 2020-06-14 NOTE — Progress Notes (Signed)
Inpatient Diabetes Program Recommendations  AACE/ADA: New Consensus Statement on Inpatient Glycemic Control (2015)  Target Ranges:  Prepandial:   less than 140 mg/dL      Peak postprandial:   less than 180 mg/dL (1-2 hours)      Critically ill patients:  140 - 180 mg/dL   Lab Results  Component Value Date   LDKCCQ 190 (HH) 06/14/2020   HGBA1C 12.7 (H) 06/14/2020    Review of Glycemic Control  Diabetes history: DM2 Outpatient Diabetes medications: Humulin 70/30 100 units at breakfast and 80 units at supper, Victoza 0.6 mg QD Current orders for Inpatient glycemic control: lantus 40 units bid, Novolog 0-20 units tidwc + 10 units tidwc, Victoza 0.6 mg QD  HgbA1C - 12.7% Has Dexcom G6 sensor  Inpatient Diabetes Program Recommendations:     Increase Lantus to 50 units bid Increase Novolog to 20 units bid  Will order OP Diabetes Education. Needs tighter control, especially using Dexcom. Also lifestyle modification with weight loss and exercise is imperative for glycemic control.  Continue to follow.  Thank you. Lorenda Peck, RD, LDN, CDE Inpatient Diabetes Coordinator [phone]

## 2020-06-14 NOTE — Progress Notes (Signed)
Pt. Has home CPAP device with tubing and mask.  Plugged machine in for patient and placed H20 in chamber per patient request.  Pt. States that she can reach machine and self place mask without assistance.

## 2020-06-14 NOTE — ED Notes (Signed)
Awaiting provider contact in regard to blood sugar level.

## 2020-06-14 NOTE — Progress Notes (Signed)
No charge note.  Patient seen and examined this morning, admitted overnight by Dr. Alcario Drought  44 year old female with super morbid obesity with a BMI of 61, poorly controlled diabetes with A1c greater than 15 April, hypertension, obesity hypoventilation, OSA on CPAP, chronic pain syndrome on chronic opiates, left BKA due to osteomyelitis came into the hospital with complaints of shortness of breath.  This has been somewhat chronic going on weeks to months.  She has had several ED visits and was discharged home up until she returned to the ED within 24 hours plan and was admitted to the hospital.  She was hypoxic requiring supplemental oxygen.  In the ED she underwent a CT angiogram which showed no PE but it did show air trapping versus pulmonary edema.  Her troponins and BNP were normal.  Acute respiratory failure with hypoxia-likely subacute, she is compliant with her CPAP but suspect a degree of obesity hypoventilation.  Have ordered [facility] consult and I have ordered home O2 as it is clear that she needs it.  2D echo pending  Morbid obesity-BMI 61, she needs to lose weight otherwise it is already life-threatening  Uncontrolled diabetes mellitus type 2 -CBG in the 500 range in the ED, no evidence of DKA, will adjust insulin regimen as below  OSA-continue home CPAP  Scheduled Meds: . amLODipine  10 mg Oral Daily  . atorvastatin  40 mg Oral Daily  . cholecalciferol  1,000 Units Oral Daily  . enoxaparin (LOVENOX) injection  75 mg Subcutaneous Q24H  . gabapentin  600 mg Oral TID  . insulin aspart  0-20 Units Subcutaneous TID WC  . insulin aspart  10 Units Subcutaneous TID WC  . insulin glargine  40 Units Subcutaneous BID  . liraglutide  0.6 mg Subcutaneous Daily  . metoprolol succinate  25 mg Oral Daily  . mometasone-formoterol  2 puff Inhalation BID  . pantoprazole  40 mg Oral Daily  . rOPINIRole  1 mg Oral QHS  . sodium chloride (PF)       Continuous Infusions: PRN Meds:.acetaminophen **OR**  acetaminophen, albuterol, cyclobenzaprine, ondansetron **OR** ondansetron (ZOFRAN) IV  Home tomorrow once her CBGs are better, she has a PT session and check her O2 sats with ambulation  Kohl Polinsky M. Cruzita Lederer, MD, PhD [facility]  Between 7 am - 7 pm you can contact me via [place] or [place].  I am not available 7 pm - 7 am, please contact night coverage MD/APP via Amion

## 2020-06-14 NOTE — H&P (Signed)
History and Physical    [name] [id number] DOB: 09-Nov-1976 DOA: 06/13/2020  PCP: [name], MD  Patient coming from: Home  I have personally briefly reviewed patient's old medical records in [place]  Chief Complaint: Respiratory distress  HPI: [name] is a 44 y.o. 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  Pt is a never smoker.  Pt has had SOB worsening over the past months to year.  Work up has included CTA (neg for PE, cardiac, or vascular abnormalities), stress test (neg and low risk), multiple CXRs.  Pt was most recently seen in ED yesterday for SOB, discharged home, became SOB again and returned to ED here at [facility].  Her shortness of breath is more when she lays down and she tries to correlate this with having a possible mold in her apartment.  She even notes that when she sleeps in her bedroom it is worse than when she sleeps in the main room.  There is no fevers, no significant swelling of the legs, she does endorse having a poor compliance with a diabetic diet, she does take her medications but eats "whenever I want".  She wears her CPAP at night but states that is not helping.  Albuterol nebs in ED helped "a little".   ED Course: CTA chest is again neg for PE, shows pulm edema vs air trapping.  Trop neg, BNP nl.   Review of Systems: As per HPI, otherwise all review of systems negative.  Past Medical History:  Diagnosis Date  . Acute osteomyelitis of ankle or foot, left ([place]) 01/31/2018  . Alveolar hypoventilation   . Anemia    not on iron pill  . Asthma   . Bipolar 2 disorder ([place])   . Carpal tunnel syndrome on right    recurrent  . Cellulitis 08/2010-08/2011  . Chronic pain   . COPD (chronic obstructive pulmonary disease) ([name])     Symbicort daily and Proventil as needed  . Costochondritis   . Depression   . Diabetes mellitus type II, uncontrolled ([place]) 2000   Type 2, Uncontrolled.Takes Lantus daily.Fasting blood sugar runs 150  . Dizziness    occasionally  . Drug-seeking behavior   . GERD (gastroesophageal reflux disease)    takes Pantoprazole and Zantac daily  . Headache    migraine-last one about a yr ago.Topamax daily  . HLD (hyperlipidemia)    takes Atorvastatin daily  . Hypertension    takes Lisinopril and Coreg daily  . Morbid obesity ([place])   . Muscle spasm    takes Flexeril as needed  . Nocturia   . OSA on CPAP   . Peripheral neuropathy    takes Gabapentin daily  . Pneumonia    "walking" several yrs ago and as a baby (12/05/2018)  . Rectal fissure   . Restless leg   . SVT (supraventricular tachycardia) ([place])   . Syncope 02/25/2016  . Urinary frequency   . Varicose veins    Right medial thigh and Left leg     Past Surgical History:  Procedure Laterality Date  . AMPUTATION Left 02/01/2018   Procedure: LEFT FOURTH AND 5TH TOE RAY AMPUTATION;  Surgeon: [name], MD;  Location: [place];  Service: Orthopedics;  Laterality: Left;  . AMPUTATION Left 03/03/2018   Procedure: LEFT  BELOW KNEE AMPUTATION;  Surgeon: [name], MD;  Location: [place];  Service: Orthopedics;  Laterality: Left;  . CARPAL TUNNEL RELEASE Bilateral   . CESAREAN SECTION  2007  . INCISION AND DRAINAGE PERIRECTAL ABSCESS Left 05/18/2019   Procedure: IRRIGATION AND DEBRIDEMENT OF PANNIS ABSCESS, POSSIBLE DEBRIDEMENT OF BUTTOCK WOUND;  Surgeon: Donnie Mesa, MD;  Location: [place];  Service: General;  Laterality: Left;  . IRRIGATION AND DEBRIDEMENT BUTTOCKS Left 05/17/2019   Procedure: IRRIGATION AND DEBRIDEMENT BUTTOCKS;  Surgeon: Donnie Mesa, MD;  Location: [facility];  Service: General;  Laterality: Left;  . KNEE ARTHROSCOPY Right 07/17/2010  . LEFT HEART CATHETERIZATION WITH CORONARY ANGIOGRAM N/A 07/27/2012   Procedure: LEFT  HEART CATHETERIZATION WITH CORONARY ANGIOGRAM;  Surgeon: Sherren Mocha, MD;  Location: [facility] CATH LAB;  Service: Cardiovascular;  Laterality: N/A;  . MASS EXCISION N/A 06/29/2013   Procedure:  WIDE LOCAL EXCISION OF POSTERIOR NECK ABSCESS;  Surgeon: Ralene Ok, MD;  Location: [facility];  Service: General;  Laterality: N/A;  . REPAIR KNEE LIGAMENT Left    "fixed ligaments and chipped patella"  . right transmetatarsal amputation        reports that she quit smoking about 26 years ago. Her smoking use included cigarettes. She has a 0.09 pack-year smoking history. She has never used smokeless tobacco. She reports current drug use. She reports that she does not drink alcohol.  Allergies  Allergen Reactions  . Cefepime Other (See Comments)    AKI, see records from [place] hospitalization in January 2020.    Marland Kitchen Kiwi Extract Shortness Of Breath and Swelling  . Morphine And Related Nausea And Vomiting  . Trental [Pentoxifylline] Nausea And Vomiting  . Nubain [Nalbuphine Hcl] Other (See Comments)    "FEELS LIKE SOMETHING CRAWLING ON ME"    Family History  Problem Relation Age of Onset  . Diabetes Mother   . Hyperlipidemia Mother   . Depression Mother   . GER disease Mother   . Allergic rhinitis Mother   . Restless legs syndrome Mother   . Heart attack Paternal Uncle   . Heart disease Paternal Grandmother   . Heart attack Paternal Grandmother   . Heart attack Paternal Grandfather   . Heart disease Paternal Grandfather   . Heart attack Father   . Migraines Sister   . Cancer Maternal Grandmother        COLON  . Hypertension Maternal Grandmother   . Hyperlipidemia Maternal Grandmother   . Diabetes Maternal Grandmother   . Other Maternal Grandfather        GUN SHOT  . Anxiety disorder Sister      Prior to Admission medications   Medication Sig Start Date End Date Taking? Authorizing Provider  amLODipine (NORVASC) 10 MG tablet Take 1 tablet (10 mg total) by mouth daily. 05/25/19  Yes [name], MD  atorvastatin (LIPITOR) 40 MG tablet Take 1 tablet (40 mg total) by mouth daily. 04/11/20  Yes [name], MD  budesonide-formoterol [facility]) 160-4.5 MCG/ACT inhaler Inhale 2 puffs into the lungs 2 (two) times daily. 05/09/20  Yes [name], MD  cetirizine (ZYRTEC) 10 MG tablet Take 10 mg by mouth daily as needed for allergies.   Yes [provider]  Cholecalciferol 1000 units tablet Take 1 tablet (1,000 Units total) by mouth daily. 04/05/17  Yes [name], MD  cyclobenzaprine (FLEXERIL) 5 MG tablet TAKE 1 TABLET BY MOUTH 3 TIMES DAILY AS NEEDED FOR MUSCLE SPASMS Patient taking differently: Take  5 mg by mouth 3 (three) times daily as needed for muscle spasms. TAKE 1 TABLET BY MOUTH 3 TIMES DAILY AS NEEDED FOR MUSCLE SPASMS 04/11/20  Yes [name], MD  gabapentin (NEURONTIN) 600 MG tablet TAKE 2 TABLETS BY MOUTH 3 TIMES DAILY Patient taking differently: Take 600 mg by mouth 3 (three) times daily.  05/09/20  Yes [name], MD  HYDROcodone-acetaminophen ([place]) 10-325 MG tablet TAKE 1 TABLET BY MOUTH EVERY SIX TO EIGHT HOURS AS NEEDED Patient taking differently: 1 tablet every 6 (six) hours as needed for moderate pain. TAKE 1 TABLET BY MOUTH EVERY SIX TO EIGHT HOURS AS NEEDED 06/11/20  Yes Chambliss, Jeb Levering, MD  insulin isophane & regular human (HUMULIN 70/30 MIX) (70-30) 100 UNIT/ML KwikPen Inject 100 Units into the skin daily with breakfast AND 80 Units daily with supper. 06/12/20  Yes Leavy Cella, RPH-CPP  metoprolol succinate (TOPROL XL) 25 MG 24 hr tablet Take 1 tablet (25 mg total) by mouth daily. 05/28/20 05/23/21 Yes Sherren Mocha, MD  pantoprazole (PROTONIX) 40 MG tablet Take 1 tablet (40 mg total) by mouth daily. 03/16/18  Yes Angiulli, Lavon Paganini, PA-C  PROVENTIL HFA 108 (90 Base) MCG/ACT inhaler Inhale 1-2 puffs into the lungs every 6 (six) hours as needed for wheezing or shortness of breath. Patient taking differently: Inhale  2 puffs into the lungs every 4 (four) hours as needed for wheezing.  05/11/19  Yes [name], MD  rOPINIRole (REQUIP) 1 MG tablet Take 1 tablet (1 mg total) by mouth at bedtime. 04/03/20  Yes [name], MD  terconazole (TERAZOL 7) 0.4 % vaginal cream Place 1 applicator vaginally at bedtime. 04/03/20  Yes [name], MD  VICTOZA 18 MG/3ML SOPN inject 0.6 MG SUBCUTANEOUSLY ONCE A DAY FOR 1 WEEK, THEN INCREASE TO 1.2 MG ONCE daily max 1.8 MG Patient taking differently: Inject 0.6-1.2 mg into the skin See admin instructions. inject 0.6 MG SUBCUTANEOUSLY ONCE A DAY FOR 1 WEEK, THEN INCREASE TO 1.2 MG ONCE daily max 1.8 MG 04/16/20  Yes [name], MD  Accu-Chek Softclix Lancets lancets Use to check blood glucose four times daily 04/17/20   Martyn Malay, MD  Continuous Blood Gluc Sensor (DEXCOM G6 SENSOR) MISC Inject 1 applicator into the skin as directed. Change sensor every 10 days. 04/10/20   Leavy Cella, RPH-CPP  Continuous Blood Gluc Transmit (DEXCOM G6 TRANSMITTER) MISC Inject 1 Device into the skin as directed. Reuse 8 times with sensor changes. 04/10/20   Leavy Cella, RPH-CPP  glucose blood (ACCU-CHEK GUIDE) test strip USE T0 CHECK BLOOD SUGAR 3 TIMES DAILY 04/18/20   Leavy Cella, RPH-CPP  Insulin Pen Needle (SURE COMFORT PEN NEEDLES) 32G X 4 MM MISC Use to inject insulin as indicated 04/17/20   Martyn Malay, MD  Lancets (ACCU-CHEK MULTICLIX) lancets 1 each by Other route as needed for other. Use as instructed 04/18/20   Leavy Cella, RPH-CPP    Physical Exam: Vitals:   06/14/20 0006 06/14/20 0332 06/14/20 0345 06/14/20 0534  BP:  140/74 (!) 142/82 (!) 151/81  Pulse:  100  (!) 101  Resp:  17 13 (!) 25  Temp:      TempSrc:      SpO2:  94%  93%  Weight: (!) 151.5 kg     Height: 5\' 2"  (1.575 m)       Constitutional: NAD, calm, comfortable Eyes: PERRL, lids and conjunctivae normal ENMT: Mucous membranes  are moist. Posterior pharynx clear of any  exudate or lesions.Normal dentition.  Neck: normal, supple, no masses, no thyromegaly Respiratory: Distant breath sounds, tachypnea present Cardiovascular: Regular rate and rhythm, no murmurs / rubs / gallops. No extremity edema. 2+ pedal pulses. No carotid bruits.  Abdomen: no tenderness, no masses palpated. No hepatosplenomegaly. Bowel sounds positive.  Musculoskeletal: no clubbing / cyanosis. No joint deformity upper and lower extremities. Good ROM, no contractures. Normal muscle tone.  Skin: no rashes, lesions, ulcers. No induration Neurologic: CN 2-12 grossly intact. Sensation intact, DTR normal. Strength 5/5 in all 4.  Psychiatric: Normal judgment and insight. Alert and oriented x 3. Normal mood.    Labs on Admission: I have personally reviewed following labs and imaging studies  CBC: Recent Labs  Lab 06/12/20 0555 06/14/20 0049  WBC 11.4* 13.4*  NEUTROABS 6.5 9.8*  HGB 10.9* 11.4*  HCT 35.7* 36.9  MCV 94.9 94.1  PLT 345 756   Basic Metabolic Panel: Recent Labs  Lab 06/12/20 0555 06/14/20 0049  NA 136 137  K 4.5 3.8  CL 102 100  CO2 24 24  GLUCOSE 401* 353*  BUN 25* 29*  CREATININE 0.91 1.19*  CALCIUM 8.5* 8.7*   GFR: Estimated Creatinine Clearance: 88.2 mL/min (A) (by C-G formula based on SCr of 1.19 mg/dL (H)). Liver Function Tests: Recent Labs  Lab 06/14/20 0049  AST 12*  ALT 16  ALKPHOS 98  BILITOT 0.3  PROT 8.1  ALBUMIN 3.4*   No results for input(s): LIPASE, AMYLASE in the last 168 hours. No results for input(s): AMMONIA in the last 168 hours. Coagulation Profile: No results for input(s): INR, PROTIME in the last 168 hours. Cardiac Enzymes: No results for input(s): CKTOTAL, CKMB, CKMBINDEX, TROPONINI in the last 168 hours. BNP (last 3 results) No results for input(s): PROBNP in the last 8760 hours. HbA1C: No results for input(s): HGBA1C in the last 72 hours. CBG: No results for input(s): GLUCAP in the last 168 hours. Lipid Profile: No  results for input(s): CHOL, HDL, LDLCALC, TRIG, CHOLHDL, LDLDIRECT in the last 72 hours. Thyroid Function Tests: No results for input(s): TSH, T4TOTAL, FREET4, T3FREE, THYROIDAB in the last 72 hours. Anemia Panel: No results for input(s): VITAMINB12, FOLATE, FERRITIN, TIBC, IRON, RETICCTPCT in the last 72 hours. Urine analysis:    Component Value Date/Time   COLORURINE STRAW (A) 03/26/2020 1302   APPEARANCEUR CLEAR 03/26/2020 1302   LABSPEC 1.022 03/26/2020 1302   PHURINE 5.0 03/26/2020 1302   GLUCOSEU >=500 (A) 03/26/2020 1302   HGBUR SMALL (A) 03/26/2020 1302   BILIRUBINUR NEGATIVE 03/26/2020 1302   BILIRUBINUR negative 11/26/2019 0955   KETONESUR NEGATIVE 03/26/2020 1302   PROTEINUR NEGATIVE 03/26/2020 1302   UROBILINOGEN 0.2 11/26/2019 0955   UROBILINOGEN 0.2 08/22/2015 0225   NITRITE NEGATIVE 03/26/2020 1302   LEUKOCYTESUR NEGATIVE 03/26/2020 1302    Radiological Exams on Admission: DG Chest 2 View  Result Date: 06/12/2020 CLINICAL DATA:  Chest pain. EXAM: CHEST - 2 VIEW COMPARISON:  05/29/2020. FINDINGS: Heart size stable. Low lung volumes with bibasilar atelectasis. Tiny bilateral pleural effusions cannot be excluded. No pneumothorax. IMPRESSION: Low lung volumes with bibasilar atelectasis. Tiny bilateral pleural effusions cannot be excluded. Electronically Signed   By: Marcello Moores  Register   On: 06/12/2020 06:30   CT Angio Chest PE W and/or Wo Contrast  Result Date: 06/14/2020 CLINICAL DATA:  Shortness of breath. EXAM: CT ANGIOGRAPHY CHEST WITH CONTRAST TECHNIQUE: Multidetector CT imaging of the chest was performed using the standard protocol  during bolus administration of intravenous contrast. Multiplanar CT image reconstructions and MIPs were obtained to evaluate the vascular anatomy. CONTRAST:  133mL OMNIPAQUE IOHEXOL 350 MG/ML SOLN COMPARISON:  Chest CT angiogram dated 05/27/2020. CT abdomen dated 12/10/2016. FINDINGS: Cardiovascular: Evaluation of the pulmonary arteries is  significantly limited by patient motion artifact and body habitus. Due to these limitations, it is not possible to exclude pulmonary embolus distal to the bilateral intralobar pulmonary arteries. There is no pulmonary embolism identified within the main pulmonary arteries bilaterally. Heart size is within normal limits. No pericardial effusion. No thoracic aortic aneurysm. Mediastinum/Nodes: No mass or enlarged lymph nodes are seen within the mediastinum. Esophagus is unremarkable. Trachea and central bronchi are unremarkable. Lungs/Pleura: Probable mild edema versus air trapping bilaterally. No consolidation, pleural effusion or pneumothorax. Upper Abdomen: Limited images of the upper abdomen are provided. Redemonstration of a RIGHT adrenal adenoma. Musculoskeletal: No acute or suspicious osseous finding. Review of the MIP images confirms the above findings. IMPRESSION: 1. No central obstructing pulmonary embolism is identified. However, significant study limitations detailed above. 2. Probable mild edema versus air trapping bilaterally. No evidence of pneumonia. 3. Heart size is within normal limits. No pericardial effusion. Electronically Signed   By: Franki Cabot M.D.   On: 06/14/2020 05:02   DG Chest Port 1 View  Result Date: 06/14/2020 CLINICAL DATA:  Shortness of breath. EXAM: PORTABLE CHEST 1 VIEW COMPARISON:  June 12, 2020 FINDINGS: Very mildly decreased lung volumes are seen which is likely secondary to the degree of patient inspiration. Very mild, diffusely increased lung markings are also seen. There is no evidence of focal consolidation, pleural effusion or pneumothorax. The heart size and mediastinal contours are within normal limits. The visualized skeletal structures are unremarkable. IMPRESSION: Very mild, diffusely increased lung markings, likely secondary to the degree of patient inspiration. Electronically Signed   By: Virgina Norfolk M.D.   On: 06/14/2020 00:49    EKG: Independently  reviewed.  Assessment/Plan Principal Problem:   Obesity hypoventilation syndrome ([name]) Active Problems:   DM (diabetes mellitus) type II uncontrolled, periph vascular disorder ([name])   Hypertension   Class 3 severe obesity due to excess calories with serious comorbidity and body mass index (BMI) of 50.0 to 59.9 in adult [facility])   Amputation above knee ([name])   Acute respiratory failure with hypoxia ([place])    1. Acute resp failure with hypoxia - more subacute 1. Suspect OHS the primary culprit here. 2. Chronic opiates probably not helping too. 3. Getting 2d echo.  Suspect however that CT findings are more air trapping and not pulm edema.  Numerous prior work ups including stress test, multiple CXRs, and CT chests have been negative recently.  So dont see why shed have pulm edema this time and not all the other times. 4. PRN albuterol treatments, but asthma seems somewhat less likely to be the culprit given the duration of symptoms (weeks to months) and presentation. 5. Cont pulse ox 6. Getting PFTs 7. Consider pulm consult to see if they have an alternative diagnosis vs confirming this diagnosis. 8. CPAP per respiratory therapy, have ordered it PRN 2. Morbid obesity - 1. BMI 61 and that's even with an AKA 2. Dietary consult 3. Ultimately I think pt should be considered a candidate for bariatric surgery given multiple co-morbidities. 3. Uncontrolled DM2 - 1. A1C was > 15 in April 2. Will put on NPH 60u BID 3. Resistant scale SSI 4. 6u mealtime novolog 5. Getting DM coordinator consult. 4. HTN -  Cont home BP med  DVT prophylaxis: Lovenox Code Status: Full Family Communication: Family at bedside Disposition Plan: Home after work up, symptom improvement, etc Consults called: None, consider pulm consult Admission status: Place in obs    Rayneisha Bouza, [place] Hospitalists  How to contact the [facility] Attending or Consulting provider [place] or covering provider during after hours [place], for this patient?  1. Check the care team in [facility] and look for a) attending/consulting TRH provider listed and b) the [facility] team listed 2. Log into [email or web address]  Amion Physician Scheduling and messaging for groups and whole hospitals  On call and physician scheduling software for group practices, residents, hospitalists and other medical providers for call, clinic, rotation and shift schedules. OnCall Enterprise is a hospital-wide system for scheduling doctors and paging doctors on call. EasyPlot is for scientific plotting and data analysis.  [email or web address]  and use [place]'s universal password to access. If you do not have the password, please contact the hospital operator.  3. Locate the [facility] provider you are looking for under [facility] and page to a number that you can be directly reached. 4. If you still have difficulty reaching the provider, please page the [facility] (Director on Call) for the Hospitalists listed on amion for assistance.  06/14/2020, 6:02 AM

## 2020-06-15 ENCOUNTER — Observation Stay (HOSPITAL_BASED_OUTPATIENT_CLINIC_OR_DEPARTMENT_OTHER): Payer: Medicaid Other

## 2020-06-15 DIAGNOSIS — E785 Hyperlipidemia, unspecified: Secondary | ICD-10-CM | POA: Diagnosis present

## 2020-06-15 DIAGNOSIS — Z888 Allergy status to other drugs, medicaments and biological substances status: Secondary | ICD-10-CM | POA: Diagnosis not present

## 2020-06-15 DIAGNOSIS — E1165 Type 2 diabetes mellitus with hyperglycemia: Secondary | ICD-10-CM | POA: Diagnosis present

## 2020-06-15 DIAGNOSIS — Z6841 Body Mass Index (BMI) 40.0 and over, adult: Secondary | ICD-10-CM | POA: Diagnosis not present

## 2020-06-15 DIAGNOSIS — E1169 Type 2 diabetes mellitus with other specified complication: Secondary | ICD-10-CM | POA: Diagnosis present

## 2020-06-15 DIAGNOSIS — Z83438 Family history of other disorder of lipoprotein metabolism and other lipidemia: Secondary | ICD-10-CM | POA: Diagnosis not present

## 2020-06-15 DIAGNOSIS — K219 Gastro-esophageal reflux disease without esophagitis: Secondary | ICD-10-CM | POA: Diagnosis present

## 2020-06-15 DIAGNOSIS — M86172 Other acute osteomyelitis, left ankle and foot: Secondary | ICD-10-CM | POA: Diagnosis not present

## 2020-06-15 DIAGNOSIS — S78119A Complete traumatic amputation at level between unspecified hip and knee, initial encounter: Secondary | ICD-10-CM

## 2020-06-15 DIAGNOSIS — J45909 Unspecified asthma, uncomplicated: Secondary | ICD-10-CM | POA: Diagnosis not present

## 2020-06-15 DIAGNOSIS — E43 Unspecified severe protein-calorie malnutrition: Secondary | ICD-10-CM | POA: Diagnosis present

## 2020-06-15 DIAGNOSIS — J449 Chronic obstructive pulmonary disease, unspecified: Secondary | ICD-10-CM | POA: Diagnosis present

## 2020-06-15 DIAGNOSIS — G894 Chronic pain syndrome: Secondary | ICD-10-CM | POA: Diagnosis present

## 2020-06-15 DIAGNOSIS — G2581 Restless legs syndrome: Secondary | ICD-10-CM | POA: Diagnosis present

## 2020-06-15 DIAGNOSIS — Z9981 Dependence on supplemental oxygen: Secondary | ICD-10-CM | POA: Diagnosis not present

## 2020-06-15 DIAGNOSIS — Z91018 Allergy to other foods: Secondary | ICD-10-CM | POA: Diagnosis not present

## 2020-06-15 DIAGNOSIS — Z89512 Acquired absence of left leg below knee: Secondary | ICD-10-CM | POA: Diagnosis not present

## 2020-06-15 DIAGNOSIS — Z79891 Long term (current) use of opiate analgesic: Secondary | ICD-10-CM | POA: Diagnosis not present

## 2020-06-15 DIAGNOSIS — G4733 Obstructive sleep apnea (adult) (pediatric): Secondary | ICD-10-CM | POA: Diagnosis not present

## 2020-06-15 DIAGNOSIS — E1140 Type 2 diabetes mellitus with diabetic neuropathy, unspecified: Secondary | ICD-10-CM | POA: Diagnosis not present

## 2020-06-15 DIAGNOSIS — Z833 Family history of diabetes mellitus: Secondary | ICD-10-CM | POA: Diagnosis not present

## 2020-06-15 DIAGNOSIS — J9601 Acute respiratory failure with hypoxia: Secondary | ICD-10-CM | POA: Diagnosis not present

## 2020-06-15 DIAGNOSIS — E669 Obesity, unspecified: Secondary | ICD-10-CM | POA: Diagnosis not present

## 2020-06-15 DIAGNOSIS — Z8249 Family history of ischemic heart disease and other diseases of the circulatory system: Secondary | ICD-10-CM | POA: Diagnosis not present

## 2020-06-15 DIAGNOSIS — Z20822 Contact with and (suspected) exposure to covid-19: Secondary | ICD-10-CM | POA: Diagnosis present

## 2020-06-15 DIAGNOSIS — R0902 Hypoxemia: Secondary | ICD-10-CM | POA: Diagnosis present

## 2020-06-15 DIAGNOSIS — E662 Morbid (severe) obesity with alveolar hypoventilation: Secondary | ICD-10-CM | POA: Diagnosis not present

## 2020-06-15 DIAGNOSIS — G5601 Carpal tunnel syndrome, right upper limb: Secondary | ICD-10-CM | POA: Diagnosis present

## 2020-06-15 DIAGNOSIS — M86179 Other acute osteomyelitis, unspecified ankle and foot: Secondary | ICD-10-CM | POA: Diagnosis present

## 2020-06-15 DIAGNOSIS — I10 Essential (primary) hypertension: Secondary | ICD-10-CM | POA: Diagnosis present

## 2020-06-15 DIAGNOSIS — J811 Chronic pulmonary edema: Secondary | ICD-10-CM | POA: Diagnosis present

## 2020-06-15 HISTORY — DX: Acute respiratory failure with hypoxia: J96.01

## 2020-06-15 LAB — ECHOCARDIOGRAM COMPLETE
Height: 62 in
Weight: 5344 oz

## 2020-06-15 LAB — GLUCOSE, CAPILLARY
Glucose-Capillary: 238 mg/dL — ABNORMAL HIGH (ref 70–99)
Glucose-Capillary: 240 mg/dL — ABNORMAL HIGH (ref 70–99)
Glucose-Capillary: 229 mg/dL — ABNORMAL HIGH (ref 70–99)

## 2020-06-15 LAB — BASIC METABOLIC PANEL
Anion gap: 11 (ref 5–15)
BUN: 49 mg/dL — ABNORMAL HIGH (ref 6–20)
CO2: 26 mmol/L (ref 22–32)
Calcium: 9.2 mg/dL (ref 8.9–10.3)
Chloride: 99 mmol/L (ref 98–111)
Creatinine, Ser: 1.13 mg/dL — ABNORMAL HIGH (ref 0.44–1.00)
GFR calc Af Amer: >60 mL/min (ref >60)
GFR calc non Af Amer: 60 mL/min — ABNORMAL LOW (ref >60)
Glucose, Bld: 261 mg/dL — ABNORMAL HIGH (ref 70–99)
Potassium: 5.1 mmol/L (ref 3.5–5.1)
Sodium: 136 mmol/L (ref 135–145)

## 2020-06-15 LAB — CBC
HCT: 36 % (ref 36.0–46.0)
Hemoglobin: 11 g/dL — ABNORMAL LOW (ref 12.0–15.0)
MCH: 29.2 pg (ref 26.0–34.0)
MCHC: 30.6 g/dL (ref 30.0–36.0)
MCV: 95.5 fL (ref 80.0–100.0)
Platelets: 352 10*3/uL (ref 150–400)
RBC: 3.77 MIL/uL — ABNORMAL LOW (ref 3.87–5.11)
RDW: 14.1 % (ref 11.5–15.5)
WBC: 12.4 10*3/uL — ABNORMAL HIGH (ref 4.0–10.5)
nRBC: 0 % (ref 0.0–0.2)

## 2020-06-15 MED ORDER — PERFLUTREN LIPID MICROSPHERE
1.0000 mL | INTRAVENOUS | Status: AC | PRN
  Administered 2020-06-15: 3 mL via INTRAVENOUS
  Filled 2020-06-15: qty 10

## 2020-06-15 MED ORDER — HYDROCODONE-ACETAMINOPHEN 10-325 MG PO TABS
1.0000 | ORAL_TABLET | Freq: Three times a day (TID) | ORAL | Status: DC | PRN
  Administered 2020-06-15: 1 via ORAL
  Filled 2020-06-15: qty 1

## 2020-06-15 NOTE — TOC Initial Note (Signed)
Transition of Care [facility]) - Initial/Assessment Note    Patient Details  Name: [name] MRN: [id number] Date of Birth: April 04, 1976  Transition of Care (TOC) CM/SW Contact:    Joaquin Courts, RN Phone Number: 06/15/2020, 3:45 PM  Clinical Narrative:   [facility] given referral for home oxygen.                 Expected Discharge Plan: Home/Self Care Barriers to Discharge: Continued Medical Work up   Patient Goals and CMS Choice Patient states their goals for this hospitalization and ongoing recovery are:: to go home      Expected Discharge Plan and Services Expected Discharge Plan: Home/Self Care   Discharge Planning Services: CM Consult                     DME Arranged: Oxygen DME Agency: [employer] Date DME Agency Contacted: 06/15/20 Time DME Agency Contacted: [phone] Representative spoke with at DME Agency: [place] Arrangements/Services     Patient language and need for interpreter reviewed:: Yes Do you feel safe going back to the place where you live?: Yes      Need for Family Participation in Patient Care: No (Comment) Care giver support system in place?: Yes (comment)   Criminal Activity/Legal Involvement Pertinent to Current Situation/Hospitalization: No - Comment as needed  Activities of Daily Living Home Assistive Devices/Equipment: Oxygen, Wheelchair, Environmental consultant (specify type), Other (Comment) (Prosthesis) ADL Screening (condition at time of admission) Patient's cognitive ability adequate to safely complete daily activities?: Yes Is the patient deaf or have difficulty hearing?: No Does the patient have difficulty seeing, even when wearing glasses/contacts?: No Does the patient have difficulty concentrating, remembering, or making decisions?: No Patient able to express need for assistance with ADLs?: Yes Does the patient have difficulty dressing or bathing?: Yes Independently performs ADLs?: No Communication: Independent Dressing  (OT): Needs assistance Is this a change from baseline?: Pre-admission baseline Grooming: Needs assistance Is this a change from baseline?: Pre-admission baseline Feeding: Independent Bathing: Needs assistance Is this a change from baseline?: Pre-admission baseline Toileting: Needs assistance Is this a change from baseline?: Pre-admission baseline In/Out Bed: Needs assistance Is this a change from baseline?: Pre-admission baseline Walks in Home: Needs assistance Is this a change from baseline?: Pre-admission baseline Does the patient have difficulty walking or climbing stairs?: Yes Weakness of Legs: Both Weakness of Arms/Hands: None  Permission Sought/Granted                  Emotional Assessment           Psych Involvement: No (comment)  Admission diagnosis:  Hypoxia [R09.02] Acute respiratory failure with hypoxia ([place]) [J96.01] Morbid obesity with body mass index (BMI) of 60.0 to 69.9 in adult ([place]) [E66.01, Z68.44] Dyspnea, unspecified type [R06.00] Acute hypoxemic respiratory failure ([place]) [J96.01] Patient Active Problem List   Diagnosis Date Noted  . Acute hypoxemic respiratory failure ([place]) 06/15/2020  . Acute respiratory failure with hypoxia ([place]) 06/14/2020  . Blister of foot 04/21/2020  . Exposure to mold 04/21/2020  . Constipation 04/03/2020  . Status post amputation of toe of right foot ([place]) 02/13/2020  . UTI (urinary tract infection) 06/28/2019  . Amputation above knee ([place]) 06/23/2019  . Left shoulder pain 06/23/2019  . Abscess of buttock, right 05/17/2019  . Severe protein-calorie malnutrition ([place])   . 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[place]) 04/11/2019  . Type 2 diabetes mellitus with diabetic polyneuropathy,  with long-term current use of insulin ([place]) 04/11/2019  . Uncontrolled type 2 diabetes mellitus with insulin therapy ([place]) 04/16/2018  . Allergic rhinitis 03/06/2018  . Unilateral complete BKA, left, sequela ([place])   . Class 3 severe obesity due to  excess calories with serious comorbidity and body mass index (BMI) of 50.0 to 59.9 in adult [facility])   . AKI (acute kidney injury) ([place])   . Decreased pedal pulses 06/10/2017  . Unilateral primary osteoarthritis, right knee 10/22/2016  . Primary osteoarthritis of first carpometacarpal joint of left hand 07/30/2016  . Diabetic neuropathy ([place]) 07/14/2016  . Nausea 03/17/2016  . De Quervain's tenosynovitis, bilateral 11/01/2015  . Vitamin D deficiency 09/05/2015  . Recurrent candidiasis of vagina 09/05/2015  . Varicose veins of leg with complications 07/08/2335  . Restless leg syndrome 10/17/2014  . Chronic sinusitis 07/18/2014  . Encounter for chronic pain management 06/30/2013  . HLD (hyperlipidemia) 11/19/2012  . Chest pain 06/27/2012  . Right carpal tunnel syndrome 09/01/2011  . Bilateral knee pain 09/01/2011  . DM (diabetes mellitus) type II uncontrolled, periph vascular disorder ([place]) 05/22/2008  . Morbid obesity ([place]) 05/22/2008  . Obesity hypoventilation syndrome ([place]) 05/22/2008  . Depression 05/22/2008  . Obstructive sleep apnea 05/22/2008  . Hypertension 05/22/2008  . Asthma 05/22/2008  . GERD 05/22/2008   PCP:  [name], MD Pharmacy:   [facility] [place], [place] - [address] [place] [address] Phone: [phone] Fax: [phone]     Social Determinants of Health (SDOH) Interventions    Readmission Risk Interventions No flowsheet data found.

## 2020-06-15 NOTE — Evaluation (Signed)
Physical Therapy Evaluation Patient Details Name: [name] MRN: [id number] DOB: [birth year] Today's Date: 06/15/2020   History of Present Illness  [name] is a 44 y.o. 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 Admitted with SOB and found to have obesity hypoventilation syndrome  Clinical Impression  Pt admitted as above and presenting with functional mobility limitations 2* generalized weakness, ambulatory balance deficits, and SOB/fatigue with limited activity.  Pt hopes to progress to dc home with assist of family and could benefit from follow up [place].  This session RN present to monitor O2 saturation test with activity.    Follow Up Recommendations Home health PT    Equipment Recommendations  None recommended by PT    Recommendations for Other Services       Precautions / Restrictions Precautions Precautions: Fall Restrictions Weight Bearing Restrictions: No      Mobility  Bed Mobility Overal bed mobility: Needs Assistance Bed Mobility: Supine to Sit     Supine to sit: Min assist;Mod assist     General bed mobility comments: Pt brings LEs over EOB and requires assist to pull trunk to upright sitting  Transfers Overall transfer level: Needs assistance   Transfers: Sit to/from Stand Sit to Stand: Min guard;Supervision         General transfer comment: min guard/sup for safety  Ambulation/Gait Ambulation/Gait assistance: Min guard;Supervision Gait Distance (Feet): 28 Feet (28' and additional 62' with seated rest break) Assistive device: Rolling walker (2 wheeled) Gait Pattern/deviations: Step-to pattern;Decreased step length - right;Decreased step length - left;Shuffle;Trunk flexed Gait velocity: decr   General Gait Details: Increased  time with min instability using RW.  Pt limited by fatigue.  Stairs            Wheelchair Mobility    Modified Rankin (Stroke Patients Only)       Balance Overall balance assessment: Needs assistance Sitting-balance support: No upper extremity supported;Feet supported Sitting balance-Leahy Scale: Good     Standing balance support: Bilateral upper extremity supported Standing balance-Leahy Scale: Fair                               Pertinent Vitals/Pain Pain Assessment: No/denies pain    Home Living Family/patient expects to be discharged to:: Private residence Living Arrangements: Children;Parent Available Help at Discharge: Family Type of Home: Apartment Home Access: Level entry     Home Layout: One level Home Equipment: Environmental consultant - 2 wheels;Wheelchair - manual      Prior Function Level of Independence: Needs assistance   Gait / Transfers Assistance Needed: Pt states ambulates limited distance with RW but primarily uses wc - using UEs and peddling with LEs to propel  ADL's / Homemaking Assistance Needed: assist of mother and dtr  Comments: Pt states 50 yr old dtr assists her to to get up [place]        Extremity/Trunk Assessment   Upper Extremity Assessment Upper Extremity Assessment: Overall WFL for tasks assessed    Lower Extremity Assessment Lower Extremity Assessment: RLE deficits/detail;LLE deficits/detail RLE Deficits / Details: Previous R transmet amp; AFO in shoe LLE Deficits / Details: L BKA       Communication   Communication: No difficulties  Cognition Arousal/Alertness: Awake/alert Behavior  During Therapy: WFL for tasks assessed/performed Overall Cognitive Status: Within Functional Limits for tasks assessed                                        General Comments      Exercises     Assessment/Plan    PT Assessment Patient needs continued PT services  PT Problem List Decreased  strength;Decreased balance;Decreased activity tolerance;Decreased mobility;Decreased knowledge of use of DME;Cardiopulmonary status limiting activity;Obesity       PT Treatment Interventions DME instruction;Gait training;Functional mobility training;Therapeutic activities;Therapeutic exercise;Balance training;Patient/family education    PT Goals (Current goals can be found in the Care Plan section)  Acute Rehab PT Goals Patient Stated Goal: Regain IND with less WOB PT Goal Formulation: With patient Time For Goal Achievement: 06/29/20 Potential to Achieve Goals: Fair    Frequency Min 3X/week   Barriers to discharge        Co-evaluation               AM-PAC PT "6 Clicks" Mobility  Outcome Measure Help needed turning from your back to your side while in a flat bed without using bedrails?: A Lot Help needed moving from lying on your back to sitting on the side of a flat bed without using bedrails?: A Lot Help needed moving to and from a bed to a chair (including a wheelchair)?: A Little Help needed standing up from a chair using your arms (e.g., wheelchair or bedside chair)?: A Little Help needed to walk in hospital room?: A Little Help needed climbing 3-5 steps with a railing? : A Lot 6 Click Score: 15    End of Session Equipment Utilized During Treatment: Gait belt;Oxygen Activity Tolerance: Patient tolerated treatment well;Patient limited by fatigue Patient left: in chair;with call bell/phone within reach;with family/visitor present Nurse Communication: Mobility status PT Visit Diagnosis: Muscle weakness (generalized) (M62.81);Unsteadiness on feet (R26.81);Difficulty in walking, not elsewhere classified (R26.2)    Time: 1323-1350 PT Time Calculation (min) (ACUTE ONLY): 27 min   Charges:   PT Evaluation $PT Eval Low Complexity: 1 Low PT Treatments $Gait Training: 8-22 mins        Debe Coder PT Acute Rehabilitation Services Pager [phone] Office  [phone]   [name] 06/15/2020, 2:56 PM

## 2020-06-15 NOTE — Progress Notes (Signed)
SATURATION QUALIFICATIONS: (This note is used to comply with regulatory documentation for home oxygen)  Patient Saturations on Room Air at Rest = 83% Patient Saturations on Room Air while Ambulating =79% Patient Saturations on 4 Liters of oxygen while Ambulating = 97% Please briefly explain why patient needs home oxygen: Patient desats without Oxygen when sitting, lying and walking. When she has 4 L via nasal cannula she ranges from 97-98% O2 sats when sitting and walking.

## 2020-06-15 NOTE — Discharge Summary (Signed)
Physician Discharge Summary  [name] [id number] DOB: [birth year] DOA: 06/13/2020  PCP: [name], MD  Admit date: 06/13/2020 Discharge date: 06/15/2020  Admitted From: home Disposition:  home  Recommendations for Outpatient Follow-up:  1. Follow up with PCP in 1-2 weeks 2. Please obtain BMP/CBC in one week  Home Health: none Equipment/Devices: home O2  Discharge Condition: stable CODE STATUS: Full code Diet recommendation: low sodium  HPI: Per admitting MD, [name] is a 44 y.o. 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 Pt is a never smoker. Pt has had SOB worsening over the past months to year.  Work up has included CTA (neg for PE, cardiac, or vascular abnormalities), stress test (neg and low risk), multiple CXRs. Pt was most recently seen in ED yesterday for SOB, discharged home, became SOB again and returned to ED here at [facility]. Her shortness of breath is more when she lays down and she tries to correlate this with having a possible mold in her apartment. She even notes that when she sleeps in her bedroom it is worse than when she sleeps in the main room. There is no fevers, no significant swelling of the legs, she does endorse having a poor compliance with a diabetic diet, she does take her medications but eats "whenever I want". She wears her CPAP at night but states that is not helping.  Hospital Course / Discharge diagnoses: Acute respiratory failure with hypoxia-patient was admitted to the hospital with acute hypoxic respiratory failure, but probably based on history this is a subacute/chronic problem.  She underwent a 2D echo while hospitalized which was technically difficult due to body habitus however did not show any component of heart failure.  CT angiogram on  admission was negative for PE.  BNP was normal and clinically she did not look fluid overloaded.  She was placed on oxygen with significant improvement in her respiratory status.  She was able to ambulate with physical therapy with oxygen feeling much better.  It is likely that she has obesity hypoventilation syndrome given her BMI  Morbid obesity-BMI 61, she needs to lose weight otherwise it is already life-threatening Uncontrolled diabetes mellitus type 2 -very poorly controlled, I recommend significant dietary changes which will also help with her obesity. OSA-continue home CPAP.  Unfortunately her CPAP machine cannot bleed in oxygen, discussed with the case manager and will be unable to exchange the machine for new 1 as that needs to happen an outpatient basis.  Discharge Instructions  Discharge Instructions    Amb Referral to Nutrition and Diabetic E   Complete by: As directed      Allergies as of 06/15/2020      Reactions   Cefepime Other (See Comments)   AKI, see records from [place] hospitalization in January 2020.     Kiwi Extract Shortness Of Breath, Swelling   Morphine And Related Nausea And Vomiting   Trental [pentoxifylline] Nausea And Vomiting   Nubain [nalbuphine Hcl] Other (See Comments)   "FEELS LIKE SOMETHING CRAWLING ON ME"      Medication List    TAKE these medications   Accu-Chek Guide test strip Generic drug: glucose blood USE T0 CHECK BLOOD SUGAR 3 TIMES DAILY   Accu-Chek Softclix Lancets lancets Use to check blood glucose four times daily   accu-chek  multiclix lancets 1 each by Other route as needed for other. Use as instructed   amLODipine 10 MG tablet Commonly known as: NORVASC Take 1 tablet (10 mg total) by mouth daily.   atorvastatin 40 MG tablet Commonly known as: LIPITOR Take 1 tablet (40 mg total) by mouth daily.   budesonide-formoterol 160-4.5 MCG/ACT inhaler Commonly known as: Symbicort Inhale 2 puffs into the lungs 2 (two) times daily.     cetirizine 10 MG tablet Commonly known as: ZYRTEC Take 10 mg by mouth daily as needed for allergies.   Cholecalciferol 25 MCG (1000 UT) tablet Take 1 tablet (1,000 Units total) by mouth daily.   cyclobenzaprine 5 MG tablet Commonly known as: FLEXERIL TAKE 1 TABLET BY MOUTH 3 TIMES DAILY AS NEEDED FOR MUSCLE SPASMS What changed: See the new instructions.   Dexcom G6 Sensor Misc Inject 1 applicator into the skin as directed. Change sensor every 10 days.   Dexcom G6 Transmitter Misc Inject 1 Device into the skin as directed. Reuse 8 times with sensor changes.   gabapentin 600 MG tablet Commonly known as: NEURONTIN TAKE 2 TABLETS BY MOUTH 3 TIMES DAILY What changed: See the new instructions.   HYDROcodone-acetaminophen 10-325 MG tablet Commonly known as: NORCO TAKE 1 TABLET BY MOUTH EVERY SIX TO EIGHT HOURS AS NEEDED What changed:   how much to take  when to take this  reasons to take this   insulin isophane & regular human (70-30) 100 UNIT/ML KwikPen Commonly known as: HUMULIN 70/30 MIX Inject 100 Units into the skin daily with breakfast AND 80 Units daily with supper.   metoprolol succinate 25 MG 24 hr tablet Commonly known as: Toprol XL Take 1 tablet (25 mg total) by mouth daily.   pantoprazole 40 MG tablet Commonly known as: PROTONIX Take 1 tablet (40 mg total) by mouth daily.   Proventil HFA 108 (90 Base) MCG/ACT inhaler Generic drug: albuterol Inhale 1-2 puffs into the lungs every 6 (six) hours as needed for wheezing or shortness of breath. What changed: See the new instructions.   rOPINIRole 1 MG tablet Commonly known as: Requip Take 1 tablet (1 mg total) by mouth at bedtime.   Sure Comfort Pen Needles 32G X 4 MM Misc Generic drug: Insulin Pen Needle Use to inject insulin as indicated   terconazole 0.4 % vaginal cream Commonly known as: TERAZOL 7 Place 1 applicator vaginally at bedtime.   Victoza 18 MG/3ML Sopn Generic drug: liraglutide inject 0.6  MG SUBCUTANEOUSLY ONCE A DAY FOR 1 WEEK, THEN INCREASE TO 1.2 MG ONCE daily max 1.8 MG What changed: See the new instructions.            Durable Medical Equipment  (From admission, onward)         Start     Ordered   06/14/20 0702  For home use only DME oxygen  Once       Question Answer Comment  Length of Need Lifetime   Mode or (Route) Nasal cannula   Liters per Minute 2   Frequency Continuous (stationary and portable oxygen unit needed)   Oxygen conserving device Yes   Oxygen delivery system Gas      06/14/20 0701          Follow-up Information    [name], MD Follow up in 1 week(s).   Specialty: Family Medicine Contact information: [place] [address] [phone]        Sherren Mocha, MD .  Specialty: Cardiology Contact information: [address] [place] [address] [phone]               Consultations:  none  Procedures/Studies:  DG Chest 2 View  Result Date: 06/12/2020 CLINICAL DATA:  Chest pain. EXAM: CHEST - 2 VIEW COMPARISON:  05/29/2020. FINDINGS: Heart size stable. Low lung volumes with bibasilar atelectasis. Tiny bilateral pleural effusions cannot be excluded. No pneumothorax. IMPRESSION: Low lung volumes with bibasilar atelectasis. Tiny bilateral pleural effusions cannot be excluded. Electronically Signed   By: [place]   On: 06/12/2020 06:30   DG Chest 2 View  Result Date: 05/27/2020 CLINICAL DATA:  Chest pain and shortness of breath. EXAM: CHEST - 2 VIEW COMPARISON:  04/14/2020 FINDINGS: The heart size and mediastinal contours are within normal limits. Both lungs are clear. The visualized skeletal structures are unremarkable. IMPRESSION: No active cardiopulmonary disease. Electronically Signed   By: Lorriane Shire M.D.   On: 05/27/2020 10:37   CT Angio Chest PE W and/or Wo Contrast  Result Date: 06/14/2020 CLINICAL DATA:  Shortness of breath. EXAM: CT  ANGIOGRAPHY CHEST WITH CONTRAST TECHNIQUE: Multidetector CT imaging of the chest was performed using the standard protocol during bolus administration of intravenous contrast. Multiplanar CT image reconstructions and MIPs were obtained to evaluate the vascular anatomy. CONTRAST:  170mL OMNIPAQUE IOHEXOL 350 MG/ML SOLN COMPARISON:  Chest CT angiogram dated 05/27/2020. CT abdomen dated 12/10/2016. FINDINGS: Cardiovascular: Evaluation of the pulmonary arteries is significantly limited by patient motion artifact and body habitus. Due to these limitations, it is not possible to exclude pulmonary embolus distal to the bilateral intralobar pulmonary arteries. There is no pulmonary embolism identified within the main pulmonary arteries bilaterally. Heart size is within normal limits. No pericardial effusion. No thoracic aortic aneurysm. Mediastinum/Nodes: No mass or enlarged lymph nodes are seen within the mediastinum. Esophagus is unremarkable. Trachea and central bronchi are unremarkable. Lungs/Pleura: Probable mild edema versus air trapping bilaterally. No consolidation, pleural effusion or pneumothorax. Upper Abdomen: Limited images of the upper abdomen are provided. Redemonstration of a RIGHT adrenal adenoma. Musculoskeletal: No acute or suspicious osseous finding. Review of the MIP images confirms the above findings. IMPRESSION: 1. No central obstructing pulmonary embolism is identified. However, significant study limitations detailed above. 2. Probable mild edema versus air trapping bilaterally. No evidence of pneumonia. 3. Heart size is within normal limits. No pericardial effusion. Electronically Signed   By: Franki Cabot M.D.   On: 06/14/2020 05:02   CT Angio Chest PE W and/or Wo Contrast  Result Date: 05/27/2020 CLINICAL DATA:  Chest pain and shortness of breath. EXAM: CT ANGIOGRAPHY CHEST WITH CONTRAST TECHNIQUE: Multidetector CT imaging of the chest was performed using the standard protocol during bolus  administration of intravenous contrast. Multiplanar CT image reconstructions and MIPs were obtained to evaluate the vascular anatomy. CONTRAST:  142mL OMNIPAQUE IOHEXOL 350 MG/ML SOLN COMPARISON:  June 27, 2012 FINDINGS: Cardiovascular: Satisfactory opacification of the pulmonary arteries to the segmental level. No evidence of pulmonary embolism. Normal heart size. No pericardial effusion. Mediastinum/Nodes: No enlarged mediastinal, hilar, or axillary lymph nodes. Thyroid gland, trachea, and esophagus demonstrate no significant findings. Lungs/Pleura: Lungs are clear. No pleural effusion or pneumothorax. Upper Abdomen: No acute abnormality. Musculoskeletal: No chest wall abnormality. No acute or significant osseous findings. Review of the MIP images confirms the above findings. IMPRESSION: Negative examination for pulmonary embolism or acute cardiopulmonary disease. Electronically Signed   By: Virgina Norfolk M.D.   On: 05/27/2020 21:04  DG Chest Port 1 View  Result Date: 06/14/2020 CLINICAL DATA:  Shortness of breath. EXAM: PORTABLE CHEST 1 VIEW COMPARISON:  June 12, 2020 FINDINGS: Very mildly decreased lung volumes are seen which is likely secondary to the degree of patient inspiration. Very mild, diffusely increased lung markings are also seen. There is no evidence of focal consolidation, pleural effusion or pneumothorax. The heart size and mediastinal contours are within normal limits. The visualized skeletal structures are unremarkable. IMPRESSION: Very mild, diffusely increased lung markings, likely secondary to the degree of patient inspiration. Electronically Signed   By: Virgina Norfolk M.D.   On: 06/14/2020 00:49   DG Chest Port 1 View  Result Date: 05/29/2020 CLINICAL DATA:  Shortness of breath.  Midsternal chest pain. EXAM: PORTABLE CHEST 1 VIEW COMPARISON:  Two-view chest x-ray 05/27/2020 FINDINGS: The heart size and hila are exaggerated by low lung volumes. No edema or effusion is present.  No focal airspace disease is evident. Degenerative changes are noted at the shoulders. IMPRESSION: 1. Low lung volumes. 2. No acute cardiopulmonary disease. Electronically Signed   By: San Morelle M.D.   On: 05/29/2020 05:30   ECHOCARDIOGRAM COMPLETE  Result Date: 06/15/2020    ECHOCARDIOGRAM REPORT   Patient Name:   [name] Date of Exam: 06/15/2020 Medical Rec #:  [id number]   Height:       62.0 in Accession #:    [id number]  Weight:       334.0 lb Date of Birth:  Dec 26, 1975    BSA:          2.377 m Patient Age:    43 years    BP:           110/70 mmHg Patient Gender: F           HR:           91 bpm. Exam Location:  Inpatient Procedure: 2D Echo and Intracardiac Opacification Agent Indications:    Dyspnea R06.00  History:        Patient has prior history of Echocardiogram examinations, most                 recent 09/01/2017. COPD; Risk Factors:Dyslipidemia.  Sonographer:    Mikki Santee RDCS (AE) Referring Phys: [phone] JARED M GARDNER  Sonographer Comments: Technically difficult study due to poor echo windows, suboptimal parasternal window, suboptimal apical window and suboptimal subcostal window. Image acquisition challenging due to patient body habitus. IMPRESSIONS  1. Limited study quality but there is normal left ventricular systolic and diastolic function, right ventricle is poorly visualized.  2. Left ventricular ejection fraction, by estimation, is 55 to 60%. The left ventricle has normal function. The left ventricle has no regional wall motion abnormalities. Left ventricular diastolic parameters were normal.  3. Right ventricular systolic function was not well visualized. The right ventricular size is normal.  4. The mitral valve is normal in structure. No evidence of mitral valve regurgitation. No evidence of mitral stenosis.  5. The aortic valve is normal in structure. Aortic valve regurgitation is not visualized. No aortic stenosis is present.  6. The inferior vena cava is normal in size  with greater than 50% respiratory variability, suggesting right atrial pressure of 3 mmHg. FINDINGS  Left Ventricle: Left ventricular ejection fraction, by estimation, is 55 to 60%. The left ventricle has normal function. The left ventricle has no regional wall motion abnormalities. Definity contrast agent was given IV to delineate the left ventricular  endocardial borders. The left  ventricular internal cavity size was normal in size. There is no left ventricular hypertrophy. Left ventricular diastolic parameters were normal. Right Ventricle: The right ventricular size is normal. No increase in right ventricular wall thickness. Right ventricular systolic function was not well visualized. Left Atrium: Left atrial size was normal in size. Right Atrium: Right atrial size was normal in size. Pericardium: There is no evidence of pericardial effusion. Mitral Valve: The mitral valve is normal in structure. Normal mobility of the mitral valve leaflets. No evidence of mitral valve regurgitation. No evidence of mitral valve stenosis. Tricuspid Valve: The tricuspid valve is normal in structure. Tricuspid valve regurgitation is not demonstrated. No evidence of tricuspid stenosis. Aortic Valve: The aortic valve is normal in structure. Aortic valve regurgitation is not visualized. No aortic stenosis is present. Pulmonic Valve: The pulmonic valve was normal in structure. Pulmonic valve regurgitation is not visualized. No evidence of pulmonic stenosis. Aorta: The aortic root is normal in size and structure. Venous: The inferior vena cava is normal in size with greater than 50% respiratory variability, suggesting right atrial pressure of 3 mmHg. IAS/Shunts: No atrial level shunt detected by color flow Doppler.  LEFT VENTRICLE PLAX 2D LVOT diam:     2.10 cm  Diastology LV SV:         69       LV e' lateral:   8.59 cm/s LV SV Index:   29       LV E/e' lateral: 13.4 LVOT Area:     3.46 cm LV e' medial:    8.27 cm/s                          LV E/e' medial:  13.9  LEFT ATRIUM           Index LA diam:      2.50 cm 1.05 cm/m LA Vol (A4C): 57.9 ml 24.36 ml/m  AORTIC VALVE LVOT Vmax:   97.00 cm/s LVOT Vmean:  69.800 cm/s LVOT VTI:    0.200 m  AORTA Ao Root diam: 3.20 cm MITRAL VALVE MV Area (PHT): 4.57 cm     SHUNTS MV Decel Time: 166 msec     Systemic VTI:  0.20 m MV E velocity: 115.00 cm/s  Systemic Diam: 2.10 cm MV A velocity: 56.00 cm/s MV E/A ratio:  2.05 Ena Dawley MD Electronically signed by Ena Dawley MD Signature Date/Time: 06/15/2020/11:12:17 AM    Final       Subjective: - no chest pain, shortness of breath, no abdominal pain, nausea or vomiting.   Discharge Exam: BP 118/72 (BP Location: Right Arm)   Pulse 90   Temp 97.8 F (36.6 C) (Oral)   Resp 20   Ht 5\' 2"  (1.575 m)   Wt (!) 151.5 kg   SpO2 92%   BMI 61.09 kg/m   General: Pt is alert, awake, not in acute distress Cardiovascular: RRR, S1/S2 +, no rubs, no gallops Respiratory: CTA bilaterally, no wheezing, no rhonchi Abdominal: Soft, NT, ND, bowel sounds + Extremities: no edema, no cyanosis    The results of significant diagnostics from this hospitalization (including imaging, microbiology, ancillary and laboratory) are listed below for reference.     Microbiology: Recent Results (from the past 240 hour(s))  SARS Coronavirus 2 by RT PCR (hospital order, performed in [facility] hospital lab) Nasopharyngeal Nasopharyngeal Swab     Status: None   Collection Time: 06/14/20  7:22 AM   Specimen: Nasopharyngeal Swab  Result Value Ref Range Status   SARS Coronavirus 2 NEGATIVE NEGATIVE Final    Comment: (NOTE) SARS-CoV-2 target nucleic acids are NOT DETECTED.  The SARS-CoV-2 RNA is generally detectable in upper and lower respiratory specimens during the acute phase of infection. The lowest concentration of SARS-CoV-2 viral copies this assay can detect is 250 copies / mL. A negative result does not preclude SARS-CoV-2 infection and should not  be used as the sole basis for treatment or other patient management decisions.  A negative result may occur with improper specimen collection / handling, submission of specimen other than nasopharyngeal swab, presence of viral mutation(s) within the areas targeted by this assay, and inadequate number of viral copies (<250 copies / mL). A negative result must be combined with clinical observations, patient history, and epidemiological information.  Fact Sheet for Patients:   StrictlyIdeas.no  Fact Sheet for Healthcare Providers: BankingDealers.co.za  This test is not yet approved or  cleared by the [place] FDA and has been authorized for detection and/or diagnosis of SARS-CoV-2 by FDA under an Emergency Use Authorization (EUA).  This EUA will remain in effect (meaning this test can be used) for the duration of the COVID-19 declaration under Section 564(b)(1) of the Act, 21 U.S.C. section 360bbb-3(b)(1), unless the authorization is terminated or revoked sooner.  Performed at [facility], [place] [address], [place], [place] [address]      Labs: Basic Metabolic Panel: Recent Labs  Lab 06/12/20 0555 06/14/20 0049 06/15/20 0636  NA 136 137 136  K 4.5 3.8 5.1  CL 102 100 99  CO2 24 24 26   GLUCOSE 401* 353* 261*  BUN 25* 29* 49*  CREATININE 0.91 1.19* 1.13*  CALCIUM 8.5* 8.7* 9.2   Liver Function Tests: Recent Labs  Lab 06/14/20 0049  AST 12*  ALT 16  ALKPHOS 98  BILITOT 0.3  PROT 8.1  ALBUMIN 3.4*   CBC: Recent Labs  Lab 06/12/20 0555 06/14/20 0049 06/15/20 0636  WBC 11.4* 13.4* 12.4*  NEUTROABS 6.5 9.8*  --   HGB 10.9* 11.4* 11.0*  HCT 35.7* 36.9 36.0  MCV 94.9 94.1 95.5  PLT 345 359 352   CBG: Recent Labs  Lab 06/14/20 1135 06/14/20 1651 06/14/20 2124 06/15/20 0731 06/15/20 1146  GLUCAP 474* 358* 354* 238* 240*   Hgb A1c Recent Labs    06/14/20 0722  HGBA1C 12.7*   Lipid  Profile No results for input(s): CHOL, HDL, LDLCALC, TRIG, CHOLHDL, LDLDIRECT in the last 72 hours. Thyroid function studies No results for input(s): TSH, T4TOTAL, T3FREE, THYROIDAB in the last 72 hours.  Invalid input(s): FREET3 Urinalysis    Component Value Date/Time   COLORURINE STRAW (A) 03/26/2020 1302   APPEARANCEUR CLEAR 03/26/2020 1302   LABSPEC 1.022 03/26/2020 1302   PHURINE 5.0 03/26/2020 1302   GLUCOSEU >=500 (A) 03/26/2020 1302   HGBUR SMALL (A) 03/26/2020 1302   BILIRUBINUR NEGATIVE 03/26/2020 1302   BILIRUBINUR negative 11/26/2019 0955   KETONESUR NEGATIVE 03/26/2020 1302   PROTEINUR NEGATIVE 03/26/2020 1302   UROBILINOGEN 0.2 11/26/2019 0955   UROBILINOGEN 0.2 08/22/2015 0225   NITRITE NEGATIVE 03/26/2020 1302   LEUKOCYTESUR NEGATIVE 03/26/2020 1302    FURTHER DISCHARGE INSTRUCTIONS:   Get Medicines reviewed and adjusted: Please take all your medications with you for your next visit with your Primary MD   Laboratory/radiological data: Please request your Primary MD to go over all hospital tests and procedure/radiological results at the follow up, please ask your Primary MD to  get all Hospital records sent to his/her office.   In some cases, they will be blood work, cultures and biopsy results pending at the time of your discharge. Please request that your primary care M.D. goes through all the records of your hospital data and follows up on these results.   Also Note the following: If you experience worsening of your admission symptoms, develop shortness of breath, life threatening emergency, suicidal or homicidal thoughts you must seek medical attention immediately by calling 911 or calling your MD immediately  if symptoms less severe.   You must read complete instructions/literature along with all the possible adverse reactions/side effects for all the Medicines you take and that have been prescribed to you. Take any new Medicines after you have completely  understood and accpet all the possible adverse reactions/side effects.    Do not drive when taking Pain medications or sleeping medications (Benzodaizepines)   Do not take more than prescribed Pain, Sleep and Anxiety Medications. It is not advisable to combine anxiety,sleep and pain medications without talking with your primary care practitioner   Special Instructions: If you have smoked or chewed Tobacco  in the last 2 yrs please stop smoking, stop any regular Alcohol  and or any Recreational drug use.   Wear Seat belts while driving.   Please note: You were cared for by a hospitalist during your hospital stay. Once you are discharged, your primary care physician will handle any further medical issues. Please note that NO REFILLS for any discharge medications will be authorized once you are discharged, as it is imperative that you return to your primary care physician (or establish a relationship with a primary care physician if you do not have one) for your post hospital discharge needs so that they can reassess your need for medications and monitor your lab values.  Time coordinating discharge: 40 minutes  SIGNED:  Marzetta Board, MD, PhD 06/15/2020, 3:49 PM

## 2020-06-15 NOTE — Progress Notes (Signed)
RN called and stated that patient wanted to wear oxygen with her CPAP.  Hospital device provided with o2 bleed in per patient request.

## 2020-06-15 NOTE — Progress Notes (Signed)
OT Cancellation Note  Patient Details Name: [name] MRN: [id number] DOB: 16-Jul-1976   Cancelled Treatment:    Reason Eval/Treat Not Completed: Other (comment). Currently pt does not have her sleeve for prosthetic, no shoes, and no brace for RLE here at hospital. Someone is suppose to at least bring the sleeve today--will await on eval until after sleeve gets here.   [place], OTR/L Acute Rehab Services Pager [phone] Office [phone]    Almon Register 06/15/2020, 9:40 AM

## 2020-06-15 NOTE — Plan of Care (Signed)
  RD consulted for nutrition education.  Lab Results  Component Value Date   HGBA1C 12.7 (H) 06/14/2020    RD working remotely. Pt contacted via inpatient room phone. RD placed "Carbohydrate Counting for People with Diabetes" handout from the Academy of [employer]etics in patient's discharge instructions. Discussed different food groups and their effects on blood sugar, emphasizing carbohydrate-containing foods. Provided list of carbohydrates and recommended serving sizes of common foods.  Discussed importance of controlled and consistent carbohydrate intake throughout the day. Provided examples of ways to balance meals/snacks and encouraged intake of high-fiber, whole grain complex carbohydrates. Pt reports consuming large amounts of sugar sweetened beverages, however has been motivated to cut down on sodas and juices and switch to diet drinks and water. Discussed diabetic friendly drink options. Teach back method used.  Expect good compliance.  Body mass index is 61.09 kg/m. Pt meets criteria for morbid obesity based on current BMI.  Current diet order is carbohydrate modified, patient is consuming approximately 100% of meals at this time. Labs and medications reviewed. No further nutrition interventions warranted at this time. RD contact information provided. If additional nutrition issues arise, please re-consult RD.  Outpatient referral for diet education placed for [facility] has been placed.  Corrin Parker, MS, RD, LDN RD pager number/after hours weekend pager number on Amion.

## 2020-06-15 NOTE — Progress Notes (Signed)
  Echocardiogram 2D Echocardiogram has been performed.  [name] 06/15/2020, 10:56 AM

## 2020-06-15 NOTE — Progress Notes (Signed)
Pt was upset early this am. Stating that night shift therapist had told her that her home cpap had mold in humidifier chamber and had set it up for her to wear anyway and did not clean it. According to the pt the therapist had explained the importance of cleaning the humidifier when she gets home. I explained to the patient this morning  that it not our policy to setup or clean home machines. The therapist from the night shift had provided her with a hospital issued machine and that is what she had wore. She then stated she must have been a little out of it and did not realize she has wore the hospital machine. I packed her home machine up in her bag for her.

## 2020-06-15 NOTE — Discharge Instructions (Signed)
Carbohydrate Counting For People With Diabetes  Foods with carbohydrates make your blood glucose level go up. Learning how to count carbohydrates can help you control your blood glucose levels. First, identify the foods you eat that contain carbohydrates. Then, using the Foods with Carbohydrates chart, determine about how much carbohydrates are in your meals and snacks. Make sure you are eating foods with fiber, protein, and healthy fat along with your carbohydrate foods. Foods with Carbohydrates The following table shows carbohydrate foods that have about 15 grams of carbohydrate each. Using measuring cups, spoons, or a food scale when you first begin learning about carbohydrate counting can help you learn about the portion sizes you typically eat. The following foods have 15 grams carbohydrate each:  Grains . 1 slice bread (1 ounce)  . 1 small tortilla (6-inch size)  .  large bagel (1 ounce)  . 1/3 cup pasta or rice (cooked)  .  hamburger or hot dog bun ( ounce)  .  cup cooked cereal  .  to  cup ready-to-eat cereal  . 2 taco shells (5-inch size) Fruit . 1 small fresh fruit ( to 1 cup)  .  medium banana  . 17 small grapes (3 ounces)  . 1 cup melon or berries  .  cup canned or frozen fruit  . 2 tablespoons dried fruit (blueberries, cherries, cranberries, raisins)  .  cup unsweetened fruit juice  Starchy Vegetables .  cup cooked beans, peas, corn, potatoes/sweet potatoes  .  large baked potato (3 ounces)  . 1 cup acorn or butternut squash  Snack Foods . 3 to 6 crackers  . 8 potato chips or 13 tortilla chips ( ounce to 1 ounce)  . 3 cups popped popcorn  Dairy . 3/4 cup (6 ounces) nonfat plain yogurt, or yogurt with sugar-free sweetener  . 1 cup milk  . 1 cup plain rice, soy, coconut or flavored almond milk Sweets and Desserts .  cup ice cream or frozen yogurt  . 1 tablespoon jam, jelly, pancake syrup, table sugar, or honey  . 2 tablespoons light pancake syrup  . 1 inch  square of frosted cake or 2 inch square of unfrosted cake  . 2 small cookies (2/3 ounce each) or  large cookie  Sometimes you'll have to estimate carbohydrate amounts if you don't know the exact recipe. One cup of mixed foods like soups can have 1 to 2 carbohydrate servings, while some casseroles might have 2 or more servings of carbohydrate. Foods that have less than 20 calories in each serving can be counted as "free" foods. Count 1 cup raw vegetables, or  cup cooked non-starchy vegetables as "free" foods. If you eat 3 or more servings at one meal, then count them as 1 carbohydrate serving.  Foods without Carbohydrates  Not all foods contain carbohydrates. Meat, some dairy, fats, non-starchy vegetables, and many beverages don't contain carbohydrate. So when you count carbohydrates, you can generally exclude chicken, pork, beef, fish, seafood, eggs, tofu, cheese, butter, sour cream, avocado, nuts, seeds, olives, mayonnaise, water, black coffee, unsweetened tea, and zero-calorie drinks. Vegetables with no or low carbohydrate include green beans, cauliflower, tomatoes, and onions. How much carbohydrate should I eat at each meal?  Carbohydrate counting can help you plan your meals and manage your weight. Following are some starting points for carbohydrate intake at each meal. Work with your registered dietitian nutritionist to find the best range that works for your blood glucose and weight.   To Lose Weight To  Maintain Weight  Women 2 - 3 carb servings 3 - 4 carb servings  Men 3 - 4 carb servings 4 - 5 carb servings  Checking your blood glucose after meals will help you know if you need to adjust the timing, type, or number of carbohydrate servings in your meal plan. Achieve and keep a healthy body weight by balancing your food intake and physical activity.  Tips How should I plan my meals?  Plan for half the food on your plate to include non-starchy vegetables, like salad greens, broccoli, or  carrots. Try to eat 3 to 5 servings of non-starchy vegetables every day. Have a protein food at each meal. Protein foods include chicken, fish, meat, eggs, or beans (note that beans contain carbohydrate). These two food groups (non-starchy vegetables and proteins) are low in carbohydrate. If you fill up your plate with these foods, you will eat less carbohydrate but still fill up your stomach. Try to limit your carbohydrate portion to  of the plate.  What fats are healthiest to eat?  Diabetes increases risk for heart disease. To help protect your heart, eat more healthy fats, such as olive oil, nuts, and avocado. Eat less saturated fats like butter, cream, and high-fat meats, like bacon and sausage. Avoid trans fats, which are in all foods that list "partially hydrogenated oil" as an ingredient. What should I drink?  Choose drinks that are not sweetened with sugar. The healthiest choices are water, carbonated or seltzer waters, and tea and coffee without added sugars.  Sweet drinks will make your blood glucose go up very quickly. One serving of soda or energy drink is  cup. It is best to drink these beverages only if your blood glucose is low.  Artificially sweetened, or diet drinks, typically do not increase your blood glucose if they have zero calories in them. Read labels of beverages, as some diet drinks do have carbohydrate and will raise your blood glucose. Label Reading Tips Read Nutrition Facts labels to find out how many grams of carbohydrate are in a food you want to eat. Don't forget: sometimes serving sizes on the label aren't the same as how much food you are going to eat, so you may need to calculate how much carbohydrate is in the food you are serving yourself.   Carbohydrate Counting for People with Diabetes Sample 1-Day Menu  Breakfast  cup yogurt, low fat, low sugar (1 carbohydrate serving)   cup cereal, ready-to-eat, unsweetened (1 carbohydrate serving)  1 cup strawberries (1  carbohydrate serving)   cup almonds ( carbohydrate serving)  Lunch 1, 5 ounce can chunk light tuna  2 ounces cheese, low fat cheddar  6 whole wheat crackers (1 carbohydrate serving)  1 small apple (1 carbohydrate servings)   cup carrots ( carbohydrate serving)   cup snap peas  1 cup 1% milk (1 carbohydrate serving)   Evening Meal Stir fry made with: 3 ounces chicken  1 cup brown rice (3 carbohydrate servings)   cup broccoli ( carbohydrate serving)   cup green beans   cup onions  1 tablespoon olive oil  2 tablespoons teriyaki sauce ( carbohydrate serving)  Evening Snack 1 extra small banana (1 carbohydrate serving)  1 tablespoon peanut butter   Carbohydrate Counting for People with Diabetes Vegan Sample 1-Day Menu  Breakfast 1 cup cooked oatmeal (2 carbohydrate servings)   cup blueberries (1 carbohydrate serving)  2 tablespoons flaxseeds  1 cup soymilk fortified with calcium and vitamin D  1  cup baby carrots ( carbohydrate serving)  1 orange (1 carbohydrate serving)  1 cup soymilk fortified with calcium and vitamin D   Evening Meal Burrito made with: 1 6-inch corn tortilla (1 carbohydrate serving)  1 cup refried vegetarian beans (2 carbohydrate servings)   cup chopped tomatoes   cup lettuce   cup salsa  1/3 cup brown rice (1 carbohydrate serving)  1 tablespoon olive oil for rice   cup zucchini   Evening Snack 6 small whole grain crackers (1 carbohydrate serving)  2 apricots ( carbohydrate serving)   cup unsalted peanuts ( carbohydrate serving)    Carbohydrate Counting for People with Diabetes Vegetarian (Lacto-Ovo) Sample 1-Day Menu  Breakfast 1 cup cooked oatmeal (2 carbohydrate servings)   cup blueberries (1 carbohydrate serving)  2 tablespoons flaxseeds  1 egg  1 cup 1% milk (1 carbohydrate serving)  1 cup coffee  Lunch 2 slices whole wheat bread (2 carbohydrate  servings)  2 ounces low-fat cheese   cup lettuce  2 slices tomato  2 slices avocado   cup baby carrots ( carbohydrate serving)  1 orange (1 carbohydrate serving)  1 cup unsweetened tea  Evening Meal Burrito made with: 1 6-inch corn tortilla (1 carbohydrate serving)   cup refried vegetarian beans (1 carbohydrate serving)   cup tomatoes   cup lettuce   cup salsa  1/3 cup brown rice (1 carbohydrate serving)  1 tablespoon olive oil for rice   cup zucchini  1 cup 1% milk (1 carbohydrate serving)  Evening Snack 6 small whole grain crackers (1 carbohydrate serving)  2 apricots ( carbohydrate serving)   cup unsalted peanuts ( carbohydrate serving)    Copyright 2020  Academy of Nutrition and Dietetics. All rights reserved.  Using Nutrition Labels: Carbohydrate  Serving Size  Look at the serving size. All the information on the label is based on this portion. Servings Per Container  The number of servings contained in the package. Guidelines for Carbohydrate  Look at the total grams of carbohydrate in the serving size.  1 carbohydrate choice = 15 grams of carbohydrate. Range of Carbohydrate Grams Per Choice  Carbohydrate Grams/Choice Carbohydrate Choices  6-10   11-20 1  21-25 1  26-35 2  36-40 2  41-50 3  51-55 3  56-65 4  66-70 4  71-80 5    Copyright 2020  Academy of [employer] and Dietetics. All rights reserved.  Naylene Foell, MS, RD, LDN Clinical Dietitian Office phone [phone] 

## 2020-06-15 NOTE — Progress Notes (Signed)
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 Someone is suppose to at least bring the sleeve today--will await on eval until after sleeve gets here.   Grayce Budden 06/15/2020, 11:56 AM

## 2020-06-16 ENCOUNTER — Ambulatory Visit: Payer: Medicaid Other | Admitting: Allergy

## 2020-06-16 ENCOUNTER — Other Ambulatory Visit: Payer: Self-pay

## 2020-06-16 ENCOUNTER — Emergency Department (HOSPITAL_COMMUNITY)
Admission: EM | Admit: 2020-06-16 | Discharge: 2020-06-16 | Disposition: A | Discharge status: Home / Self Care | Payer: Medicaid Other | Attending: Emergency Medicine | Admitting: Emergency Medicine

## 2020-06-16 ENCOUNTER — Encounter (HOSPITAL_COMMUNITY): Payer: Self-pay

## 2020-06-16 DIAGNOSIS — J449 Chronic obstructive pulmonary disease, unspecified: Secondary | ICD-10-CM | POA: Insufficient documentation

## 2020-06-16 DIAGNOSIS — I10 Essential (primary) hypertension: Secondary | ICD-10-CM | POA: Diagnosis not present

## 2020-06-16 DIAGNOSIS — Z79899 Other long term (current) drug therapy: Secondary | ICD-10-CM | POA: Insufficient documentation

## 2020-06-16 DIAGNOSIS — R0902 Hypoxemia: Secondary | ICD-10-CM | POA: Diagnosis not present

## 2020-06-16 DIAGNOSIS — E1142 Type 2 diabetes mellitus with diabetic polyneuropathy: Secondary | ICD-10-CM | POA: Insufficient documentation

## 2020-06-16 DIAGNOSIS — Z87891 Personal history of nicotine dependence: Secondary | ICD-10-CM | POA: Diagnosis not present

## 2020-06-16 DIAGNOSIS — R0602 Shortness of breath: Secondary | ICD-10-CM | POA: Diagnosis not present

## 2020-06-16 DIAGNOSIS — Z794 Long term (current) use of insulin: Secondary | ICD-10-CM | POA: Insufficient documentation

## 2020-06-16 DIAGNOSIS — J45909 Unspecified asthma, uncomplicated: Secondary | ICD-10-CM | POA: Diagnosis not present

## 2020-06-16 DIAGNOSIS — E1140 Type 2 diabetes mellitus with diabetic neuropathy, unspecified: Secondary | ICD-10-CM | POA: Diagnosis not present

## 2020-06-16 NOTE — Discharge Instructions (Signed)
Thank you for allowing me to care for you today. Please return to the emergency department if you have new or worsening symptoms. Take your medications as instructed.  ° °

## 2020-06-16 NOTE — Telephone Encounter (Signed)
Referral entered Belinda Kerwin [name], MD  

## 2020-06-16 NOTE — ED Triage Notes (Signed)
EMS reports from home, [place] from [facility] yesterday, sent home with O2 and ran out last night, increasing SOB since.  BP 163/114 HR 98 RR 28 Sp02 95 on 4ltrs (2ltrs 24-7) Temp 98.7 CBG 193  20ga L Arm

## 2020-06-16 NOTE — ED Provider Notes (Signed)
[facility] DEPT Provider Note   CSN: [id number] Arrival date & time: 06/16/20  1223     History Chief Complaint  Patient presents with  . Shortness of Breath    [name] is a 44 y.o. female.  Patient is a 44 y/o morbidly obese female presenting to the eR for need of home oxygen. Patient was release from the hospital yesterday after a stay for chronic respiratory failure deemed to be related to obesity hypoventilation syndrome. She was sent home with home oxygen and reports her portable tank was not working so she used her stationary tank all night with her CPAP and when she woke up the tank was empty and she was SOB so she came to the [facility] . No new symtpoms since her discharge. She reports the company for her machine was supposed to come to her house today but she is unsure what time and was feeling too SOB to wait.         Past Medical History:  Diagnosis Date  . Acute osteomyelitis of ankle or foot, left ([place]) 01/31/2018  . Alveolar hypoventilation   . Anemia    not on iron pill  . Asthma   . Bipolar 2 disorder ([place])   . Carpal tunnel syndrome on right    recurrent  . Cellulitis 08/2010-08/2011  . Chronic pain   . COPD (chronic obstructive pulmonary disease) ([name])    Symbicort daily and Proventil as needed  . Costochondritis   . Depression   . Diabetes mellitus type II, uncontrolled ([place]) 2000   Type 2, Uncontrolled.Takes Lantus daily.Fasting blood sugar runs 150  . Dizziness    occasionally  . Drug-seeking behavior   . GERD (gastroesophageal reflux disease)    takes Pantoprazole and Zantac daily  . Headache    migraine-last one about a yr ago.Topamax daily  . HLD (hyperlipidemia)    takes Atorvastatin daily  . Hypertension    takes Lisinopril and Coreg daily  . Morbid obesity ([place])   . Muscle spasm    takes Flexeril as needed  . Nocturia   . OSA on CPAP   . Peripheral neuropathy    takes Gabapentin daily  . Pneumonia     "walking" several yrs ago and as a baby (12/05/2018)  . Rectal fissure   . Restless leg   . SVT (supraventricular tachycardia) ([place])   . Syncope 02/25/2016  . Urinary frequency   . Varicose veins    Right medial thigh and Left leg     Patient Active Problem List   Diagnosis Date Noted  . Acute hypoxemic respiratory failure ([facility]) 06/15/2020  . Acute respiratory failure with hypoxia ([place]) 06/14/2020  . Blister of foot 04/21/2020  . Exposure to mold 04/21/2020  . Constipation 04/03/2020  . Status post amputation of toe of right foot ([place]) 02/13/2020  . UTI (urinary tract infection) 06/28/2019  . Amputation above knee ([place]) 06/23/2019  . Left shoulder pain 06/23/2019  . Abscess of buttock, right 05/17/2019  . Severe protein-calorie malnutrition ([place])   . 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[place]) 04/11/2019  . Type 2 diabetes mellitus with diabetic polyneuropathy, with long-term current use of insulin ([place]) 04/11/2019  . Uncontrolled type 2 diabetes mellitus with insulin therapy ([place]) 04/16/2018  . Allergic rhinitis 03/06/2018  . Unilateral complete BKA, left, sequela ([place])   . Class 3 severe obesity due to excess calories with serious comorbidity and body mass index (BMI) of 50.0 to 59.9 in adult [facility])   .  AKI (acute kidney injury) ([place])   . Decreased pedal pulses 06/10/2017  . Unilateral primary osteoarthritis, right knee 10/22/2016  . Primary osteoarthritis of first carpometacarpal joint of left hand 07/30/2016  . Diabetic neuropathy ([place]) 07/14/2016  . Nausea 03/17/2016  . De Quervain's tenosynovitis, bilateral 11/01/2015  . Vitamin D deficiency 09/05/2015  . Recurrent candidiasis of vagina 09/05/2015  . Varicose veins of leg with complications 69/67/8938  . Restless leg syndrome 10/17/2014  . Chronic sinusitis 07/18/2014  . Encounter for chronic pain management 06/30/2013  . HLD (hyperlipidemia) 11/19/2012  . Chest pain 06/27/2012  . Right carpal tunnel syndrome 09/01/2011  .  Bilateral knee pain 09/01/2011  . DM (diabetes mellitus) type II uncontrolled, periph vascular disorder ([place]) 05/22/2008  . Morbid obesity ([place]) 05/22/2008  . Obesity hypoventilation syndrome ([place]) 05/22/2008  . Depression 05/22/2008  . Obstructive sleep apnea 05/22/2008  . Hypertension 05/22/2008  . Asthma 05/22/2008  . GERD 05/22/2008    Past Surgical History:  Procedure Laterality Date  . AMPUTATION Left 02/01/2018   Procedure: LEFT FOURTH AND 5TH TOE RAY AMPUTATION;  Surgeon: [name], MD;  Location: [facility];  Service: Orthopedics;  Laterality: Left;  . AMPUTATION Left 03/03/2018   Procedure: LEFT BELOW KNEE AMPUTATION;  Surgeon: [name], MD;  Location: [facility];  Service: Orthopedics;  Laterality: Left;  . CARPAL TUNNEL RELEASE Bilateral   . CESAREAN SECTION  2007  . INCISION AND DRAINAGE PERIRECTAL ABSCESS Left 05/18/2019   Procedure: IRRIGATION AND DEBRIDEMENT OF PANNIS ABSCESS, POSSIBLE DEBRIDEMENT OF BUTTOCK WOUND;  Surgeon: Donnie Mesa, MD;  Location: [facility];  Service: General;  Laterality: Left;  . IRRIGATION AND DEBRIDEMENT BUTTOCKS Left 05/17/2019   Procedure: IRRIGATION AND DEBRIDEMENT BUTTOCKS;  Surgeon: Donnie Mesa, MD;  Location: [facility];  Service: General;  Laterality: Left;  . KNEE ARTHROSCOPY Right 07/17/2010  . LEFT HEART CATHETERIZATION WITH CORONARY ANGIOGRAM N/A 07/27/2012   Procedure: LEFT HEART CATHETERIZATION WITH CORONARY ANGIOGRAM;  Surgeon: Sherren Mocha, MD;  Location[facility] C[facility] LAB;  Service: Cardiovascular;  Laterality: N/A;  . MASS EXCISION N/A 06/29/2013   Procedure:  WIDE LOCAL EXCISION OF POSTERIOR NECK ABSCESS;  Surgeon: Ralene Ok, MD;  Location: [facility];  Service: General;  Laterality: N/A;  . REPAIR KNEE LIGAMENT Left    "fixed ligaments and chipped patella"  . right transmetatarsal amputation        OB History    Gravida  2   Para  1   Term      Preterm      AB  1   Living  1     SAB  1   TAB      Ectopic       Multiple      Live Births              Family History  Problem Relation Age of Onset  . Diabetes Mother   . Hyperlipidemia Mother   . Depression Mother   . GER disease Mother   . Allergic rhinitis Mother   . Restless legs syndrome Mother   . Heart attack Paternal Uncle   . Heart disease Paternal Grandmother   . Heart attack Paternal Grandmother   . Heart attack Paternal Grandfather   . Heart disease Paternal Grandfather   . Heart attack Father   . Migraines Sister   . Cancer Maternal Grandmother        COLON  . Hypertension Maternal Grandmother   . Hyperlipidemia Maternal  Grandmother   . Diabetes Maternal Grandmother   . Other Maternal Grandfather        GUN SHOT  . Anxiety disorder Sister     Social History   Tobacco Use  . Smoking status: Former Smoker    Packs/day: 0.30    Years: 0.30    Pack years: 0.09    Types: Cigarettes    Quit date: 12/06/1993    Years since quitting: 26.5  . Smokeless tobacco: Never Used  Vaping Use  . Vaping Use: Never used  Substance Use Topics  . Alcohol use: No    Alcohol/week: 0.0 standard drinks  . Drug use: Yes    Comment: OD attempts on home meds      Home Medications Prior to Admission medications   Medication Sig Start Date End Date Taking? Authorizing Provider  Accu-Chek Softclix Lancets lancets Use to check blood glucose four times daily 04/17/20   Martyn Malay, MD  amLODipine (NORVASC) 10 MG tablet Take 1 tablet (10 mg total) by mouth daily. 05/25/19   [name], MD  atorvastatin (LIPITOR) 40 MG tablet Take 1 tablet (40 mg total) by mouth daily. 04/11/20   [name], MD  budesonide-formoterol [facility]) 160-4.5 MCG/ACT inhaler Inhale 2 puffs into the lungs 2 (two) times daily. 05/09/20   [name], MD  cetirizine (ZYRTEC) 10 MG tablet Take 10 mg by mouth daily as needed for allergies.    [provider]  Cholecalciferol 1000 units tablet Take 1 tablet (1,000 Units total) by mouth daily.  04/05/17   [name], MD  Continuous Blood Gluc Sensor (DEXCOM G6 SENSOR) MISC Inject 1 applicator into the skin as directed. Change sensor every 10 days. 04/10/20   Leavy Cella, RPH-CPP  Continuous Blood Gluc Transmit (DEXCOM G6 TRANSMITTER) MISC Inject 1 Device into the skin as directed. Reuse 8 times with sensor changes. 04/10/20   Leavy Cella, RPH-CPP  cyclobenzaprine (FLEXERIL) 5 MG tablet TAKE 1 TABLET BY MOUTH 3 TIMES DAILY AS NEEDED FOR MUSCLE SPASMS Patient taking differently: Take 5 mg by mouth 3 (three) times daily as needed for muscle spasms. TAKE 1 TABLET BY MOUTH 3 TIMES DAILY AS NEEDED FOR MUSCLE SPASMS 04/11/20   [name], MD  gabapentin (NEURONTIN) 600 MG tablet TAKE 2 TABLETS BY MOUTH 3 TIMES DAILY Patient taking differently: Take 600 mg by mouth 3 (three) times daily.  05/09/20   [name], MD  glucose blood (ACCU-CHEK GUIDE) test strip USE T0 CHECK BLOOD SUGAR 3 TIMES DAILY 04/18/20   Leavy Cella, RPH-CPP  HYDROcodone-acetaminophen ([place]) 10-325 MG tablet TAKE 1 TABLET BY MOUTH EVERY SIX TO EIGHT HOURS AS NEEDED Patient taking differently: 1 tablet every 6 (six) hours as needed for moderate pain. TAKE 1 TABLET BY MOUTH EVERY SIX TO EIGHT HOURS AS NEEDED 06/11/20   Chambliss, Jeb Levering, MD  insulin isophane & regular human (HUMULIN 70/30 MIX) (70-30) 100 UNIT/ML KwikPen Inject 100 Units into the skin daily with breakfast AND 80 Units daily with supper. 06/12/20   Leavy Cella, RPH-CPP  Insulin Pen Needle (SURE COMFORT PEN NEEDLES) 32G X 4 MM MISC Use to inject insulin as indicated 04/17/20   Martyn Malay, MD  Lancets (ACCU-CHEK MULTICLIX) lancets 1 each by Other route as needed for other. Use as instructed 04/18/20   Leavy Cella, RPH-CPP  metoprolol succinate (TOPROL XL) 25 MG 24 hr tablet Take 1 tablet (25 mg total) by mouth daily.  05/28/20 05/23/21  Sherren Mocha, MD  pantoprazole (PROTONIX) 40 MG tablet Take 1 tablet (40 mg total) by mouth  daily. 03/16/18   Angiulli, Lavon Paganini, PA-C  PROVENTIL HFA 108 (90 Base) MCG/ACT inhaler Inhale 1-2 puffs into the lungs every 6 (six) hours as needed for wheezing or shortness of breath. Patient taking differently: Inhale 2 puffs into the lungs every 4 (four) hours as needed for wheezing.  05/11/19   [name], MD  rOPINIRole (REQUIP) 1 MG tablet Take 1 tablet (1 mg total) by mouth at bedtime. 04/03/20   [name], MD  terconazole (TERAZOL 7) 0.4 % vaginal cream Place 1 applicator vaginally at bedtime. 04/03/20   [name], MD  VICTOZA 18 MG/3ML SOPN inject 0.6 MG SUBCUTANEOUSLY ONCE A DAY FOR 1 WEEK, THEN INCREASE TO 1.2 MG ONCE daily max 1.8 MG Patient taking differently: Inject 0.6-1.2 mg into the skin See admin instructions. inject 0.6 MG SUBCUTANEOUSLY ONCE A DAY FOR 1 WEEK, THEN INCREASE TO 1.2 MG ONCE daily max 1.8 MG 04/16/20   [name], MD    Allergies    Cefepime, Kiwi extract, Morphine and related, Trental [pentoxifylline], and Nubain [nalbuphine hcl]  Review of Systems   Review of Systems  Constitutional: Negative.   Respiratory: Positive for shortness of breath. Negative for cough.   Cardiovascular: Negative for chest pain.  Gastrointestinal: Negative for abdominal pain.  All other systems reviewed and are negative.   Physical Exam Updated Vital Signs BP (!) 142/75   Pulse 94   Temp 98.8 F (37.1 C) (Oral)   Resp (!) 24   SpO2 98%   Physical Exam Vitals and nursing note reviewed.  Constitutional:      General: She is not in acute distress.    Appearance: Normal appearance. She is obese. She is not ill-appearing, toxic-appearing or diaphoretic.  HENT:     Head: Normocephalic.  Eyes:     Conjunctiva/sclera: Conjunctivae normal.  Cardiovascular:     Rate and Rhythm: Normal rate and regular rhythm.  Pulmonary:     Effort: Pulmonary effort is normal.     Comments: On 4l nasal cannula Skin:    General: Skin is dry.    Neurological:     Mental Status: She is alert and oriented to person, place, and time.  Psychiatric:        Mood and Affect: Mood normal.     ED Results / Procedures / Treatments   Labs (all labs ordered are listed, but only abnormal results are displayed) Labs Reviewed - No data to display  EKG None  Radiology ECHOCARDIOGRAM COMPLETE  Result Date: 06/15/2020    ECHOCARDIOGRAM REPORT   Patient Name:   KAEGAN STIGLER Date of Exam: 06/15/2020 Medical Rec #:  [id number]   Height:       62.0 in Accession #:    [id number]  Weight:       334.0 lb Date of Birth:  [birth year]    BSA:          2.377 m Patient Age:    70 years    BP:           110/70 mmHg Patient Gender: F           HR:           91 bpm. Exam Location:  Inpatient Procedure: 2D Echo and Intracardiac Opacification Agent Indications:    Dyspnea R06.00  History:  Patient has prior history of Echocardiogram examinations, most                 recent 09/01/2017. COPD; Risk Factors:Dyslipidemia.  Sonographer:    Mikki Santee RDCS (AE) Referring Phys: [phone] JARED M GARDNER  Sonographer Comments: Technically difficult study due to poor echo windows, suboptimal parasternal window, suboptimal apical window and suboptimal subcostal window. Image acquisition challenging due to patient body habitus. IMPRESSIONS  1. Limited study quality but there is normal left ventricular systolic and diastolic function, right ventricle is poorly visualized.  2. Left ventricular ejection fraction, by estimation, is 55 to 60%. The left ventricle has normal function. The left ventricle has no regional wall motion abnormalities. Left ventricular diastolic parameters were normal.  3. Right ventricular systolic function was not well visualized. The right ventricular size is normal.  4. The mitral valve is normal in structure. No evidence of mitral valve regurgitation. No evidence of mitral stenosis.  5. The aortic valve is normal in structure. Aortic valve regurgitation is  not visualized. No aortic stenosis is present.  6. The inferior vena cava is normal in size with greater than 50% respiratory variability, suggesting right atrial pressure of 3 mmHg. FINDINGS  Left Ventricle: Left ventricular ejection fraction, by estimation, is 55 to 60%. The left ventricle has normal function. The left ventricle has no regional wall motion abnormalities. Definity contrast agent was given IV to delineate the left ventricular  endocardial borders. The left ventricular internal cavity size was normal in size. There is no left ventricular hypertrophy. Left ventricular diastolic parameters were normal. Right Ventricle: The right ventricular size is normal. No increase in right ventricular wall thickness. Right ventricular systolic function was not well visualized. Left Atrium: Left atrial size was normal in size. Right Atrium: Right atrial size was normal in size. Pericardium: There is no evidence of pericardial effusion. Mitral Valve: The mitral valve is normal in structure. Normal mobility of the mitral valve leaflets. No evidence of mitral valve regurgitation. No evidence of mitral valve stenosis. Tricuspid Valve: The tricuspid valve is normal in structure. Tricuspid valve regurgitation is not demonstrated. No evidence of tricuspid stenosis. Aortic Valve: The aortic valve is normal in structure. Aortic valve regurgitation is not visualized. No aortic stenosis is present. Pulmonic Valve: The pulmonic valve was normal in structure. Pulmonic valve regurgitation is not visualized. No evidence of pulmonic stenosis. Aorta: The aortic root is normal in size and structure. Venous: The inferior vena cava is normal in size with greater than 50% respiratory variability, suggesting right atrial pressure of 3 mmHg. IAS/Shunts: No atrial level shunt detected by color flow Doppler.  LEFT VENTRICLE PLAX 2D LVOT diam:     2.10 cm  Diastology LV SV:         69       LV e' lateral:   8.59 cm/s LV SV Index:   29        LV E/e' lateral: 13.4 LVOT Area:     3.46 cm LV e' medial:    8.27 cm/s                         LV E/e' medial:  13.9  LEFT ATRIUM           Index LA diam:      2.50 cm 1.05 cm/m LA Vol (A4C): 57.9 ml 24.36 ml/m  AORTIC VALVE LVOT Vmax:   97.00 cm/s LVOT Vmean:  69.800 cm/s LVOT VTI:  0.200 m  AORTA Ao Root diam: 3.20 cm MITRAL VALVE MV Area (PHT): 4.57 cm     SHUNTS MV Decel Time: 166 msec     Systemic VTI:  0.20 m MV E velocity: 115.00 cm/s  Systemic Diam: 2.10 cm MV A velocity: 56.00 cm/s MV E/A ratio:  2.05 Ena Dawley MD Electronically signed by Ena Dawley MD Signature Date/Time: 06/15/2020/11:12:17 AM    Final     Procedures Procedures (including critical care time)  Medications Ordered in ED Medications - No data to display  ED Course  I have reviewed the triage vital signs and the nursing notes.  Pertinent labs & imaging results that were available during my care of the patient were reviewed by me and considered in my medical decision making (see chart for details).  Clinical Course as of Jun 16 2045  Mon Jun 16, 2020  1348 Patient presenting for need of home oxygen after her tank ran out. Recent admission for the same. Appears well without other complaints. Consulting TOC   [KM]  8563 I confirmed with TOC that all equipment is delivered and ready at patient's home. Will d/c from the ER at this time   [KM]    Clinical Course User Index [KM] Kristine Royal   MDM Rules/Calculators/A&P                          Based on review of vitals, medical screening exam, lab work and/or imaging, there does not appear to be an acute, emergent etiology for the patient's symptoms. Counseled pt on good return precautions and encouraged both PCP and ED follow-up as needed.  Prior to discharge, I also discussed incidental imaging findings with patient in detail and advised appropriate, recommended follow-up in detail.  Clinical Impression: 1. SOB (shortness of breath)      Disposition: Discharge  Prior to providing a prescription for a controlled substance, I independently reviewed the patient's recent prescription history on the [facility]. The patient had no recent or regular prescriptions and was deemed appropriate for a brief, less than 3 day prescription of narcotic for acute analgesia.  This note was prepared with assistance of Systems analyst. Occasional wrong-word or sound-a-like substitutions may have occurred due to the inherent limitations of voice recognition software.  Final Clinical Impression(s) / ED Diagnoses Final diagnoses:  None    Rx / DC Orders ED Discharge Orders    None       Kristine Royal 06/16/20 2046    Varney Biles, MD 06/17/20 1355

## 2020-06-16 NOTE — Discharge Planning (Signed)
RNCM spoke with patient via telephone.  Pt states her portable tank was faulty and did not charge, leading her to utilize the back up tank.  When [facility] arrived for deliver of equipment today, she was in ER and they took portable tank along with used tank and did not leave replacement as there was no on there over 18 to leave it with. RNCM confirming with [facility] delivery of equipment prior to pt discharge home.

## 2020-06-16 NOTE — Discharge Planning (Signed)
RNCM confirmed with [facility], [name] that oxygen tank and transportable oxygen concentrator (TOC) was delivered to pt at bedside yesterday 06/15/20.  Pt should have access to oxygen so long as TOC is charged.  [facility] will attempt to deliver home concentrator in the coming days as patient was in ER on first attempt.

## 2020-06-16 NOTE — Progress Notes (Deleted)
New Patient Note  RE: [name] MRN: [id number] DOB: 1976-03-19 Date of Office Visit: 06/16/2020  Referring provider: [name], MD Primary care provider: [name], MD  Chief Complaint: No chief complaint on file.  History of Present Illness: I had the pleasure of seeing [name] for initial evaluation at the [facility] on 06/16/2020. She is a 44 y.o. female, who is referred here by [name], MD for the evaluation of mold exposure.  ***  Assessment and Plan: [name] is a 44 y.o. female with: No problem-specific Assessment & Plan notes found for this encounter.  No follow-ups on file.  No orders of the defined types were placed in this encounter.  Lab Orders  No laboratory test(s) ordered today    Other allergy screening: Asthma: {Blank single:19197::"yes","no"} Rhino conjunctivitis: {Blank single:19197::"yes","no"} Food allergy: {Blank single:19197::"yes","no"} Medication allergy: {Blank single:19197::"yes","no"} Hymenoptera allergy: {Blank single:19197::"yes","no"} Urticaria: {Blank single:19197::"yes","no"} Eczema:{Blank single:19197::"yes","no"} History of recurrent infections suggestive of immunodeficency: {Blank single:19197::"yes","no"}  Diagnostics: Spirometry:  Tracings reviewed. Her effort: {Blank single:19197::"Good reproducible efforts.","It was hard to get consistent efforts and there is a question as to whether this reflects a maximal maneuver.","Poor effort, data can not be interpreted."} FVC: ***L FEV1: ***L, ***% predicted FEV1/FVC ratio: ***% Interpretation: {Blank single:19197::"Spirometry consistent with mild obstructive disease","Spirometry consistent with moderate obstructive disease","Spirometry consistent with severe obstructive disease","Spirometry consistent with possible restrictive disease","Spirometry consistent with mixed obstructive and restrictive disease","Spirometry uninterpretable due  to technique","Spirometry consistent with normal pattern","No overt abnormalities noted given today's efforts"}.  Please see scanned spirometry results for details.  Skin Testing: {Blank single:19197::"Select foods","Environmental allergy panel","Environmental allergy panel and select foods","Food allergy panel","None","Deferred due to recent antihistamines use"}. Positive test to: ***. Negative test to: ***.  Results discussed with patient/family.   Past Medical History: Patient Active Problem List   Diagnosis Date Noted  . Acute hypoxemic respiratory failure ([place]) 06/15/2020  . Acute respiratory failure with hypoxia ([place]) 06/14/2020  . Blister of foot 04/21/2020  . Exposure to mold 04/21/2020  . Constipation 04/03/2020  . Status post amputation of toe of right foot ([place]) 02/13/2020  . UTI (urinary tract infection) 06/28/2019  . Amputation above knee ([place]) 06/23/2019  . Left shoulder pain 06/23/2019  . Abscess of buttock, right 05/17/2019  . Severe protein-calorie malnutrition ([place])   . 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[place]) 04/11/2019  . Type 2 diabetes mellitus with diabetic polyneuropathy, with long-term current use of insulin ([place]) 04/11/2019  . Uncontrolled type 2 diabetes mellitus with insulin therapy ([place]) 04/16/2018  . Allergic rhinitis 03/06/2018  . Unilateral complete BKA, left, sequela ([place])   . Class 3 severe obesity due to excess calories with serious comorbidity and body mass index (BMI) of 50.0 to 59.9 in adult [facility])   . AKI (acute kidney injury) ([place])   . Decreased pedal pulses 06/10/2017  . Unilateral primary osteoarthritis, right knee 10/22/2016  . Primary osteoarthritis of first carpometacarpal joint of left hand 07/30/2016  . Diabetic neuropathy ([place]) 07/14/2016  . Nausea 03/17/2016  . De Quervain's tenosynovitis, bilateral 11/01/2015  . Vitamin D deficiency 09/05/2015  . Recurrent candidiasis of vagina 09/05/2015  . Varicose veins of leg with complications 53/61/4431   . Restless leg syndrome 10/17/2014  . Chronic sinusitis 07/18/2014  . Encounter for chronic pain management 06/30/2013  . HLD (hyperlipidemia) 11/19/2012  . Chest pain 06/27/2012  . Right carpal tunnel syndrome 09/01/2011  . Bilateral knee pain 09/01/2011  . DM (diabetes mellitus) type II uncontrolled, periph  vascular disorder ([place]) 05/22/2008  . Morbid obesity ([place]) 05/22/2008  . Obesity hypoventilation syndrome ([place]) 05/22/2008  . Depression 05/22/2008  . Obstructive sleep apnea 05/22/2008  . Hypertension 05/22/2008  . Asthma 05/22/2008  . GERD 05/22/2008   Past Medical History:  Diagnosis Date  . Acute osteomyelitis of ankle or foot, left ([place]) 01/31/2018  . Alveolar hypoventilation   . Anemia    not on iron pill  . Asthma   . Bipolar 2 disorder ([place])   . Carpal tunnel syndrome on right    recurrent  . Cellulitis 08/2010-08/2011  . Chronic pain   . COPD (chronic obstructive pulmonary disease) ([name])    Symbicort daily and Proventil as needed  . Costochondritis   . Depression   . Diabetes mellitus type II, uncontrolled ([place]) 2000   Type 2, Uncontrolled.Takes Lantus daily.Fasting blood sugar runs 150  . Dizziness    occasionally  . Drug-seeking behavior   . GERD (gastroesophageal reflux disease)    takes Pantoprazole and Zantac daily  . Headache    migraine-last one about a yr ago.Topamax daily  . HLD (hyperlipidemia)    takes Atorvastatin daily  . Hypertension    takes Lisinopril and Coreg daily  . Morbid obesity ([place])   . Muscle spasm    takes Flexeril as needed  . Nocturia   . OSA on CPAP   . Peripheral neuropathy    takes Gabapentin daily  . Pneumonia    "walking" several yrs ago and as a baby (12/05/2018)  . Rectal fissure   . Restless leg   . SVT (supraventricular tachycardia) ([place])   . Syncope 02/25/2016  . Urinary frequency   . Varicose veins    Right medial thigh and Left leg    Past Surgical History: Past Surgical History:  Procedure Laterality  Date  . AMPUTATION Left 02/01/2018   Procedure: LEFT FOURTH AND 5TH TOE RAY AMPUTATION;  Surgeon: [name], MD;  Location: [facility];  Service: Orthopedics;  Laterality: Left;  . AMPUTATION Left 03/03/2018   Procedure: LEFT BELOW KNEE AMPUTATION;  Surgeon: [name], MD;  Location: [facility];  Service: Orthopedics;  Laterality: Left;  . CARPAL TUNNEL RELEASE Bilateral   . CESAREAN SECTION  2007  . INCISION AND DRAINAGE PERIRECTAL ABSCESS Left 05/18/2019   Procedure: IRRIGATION AND DEBRIDEMENT OF PANNIS ABSCESS, POSSIBLE DEBRIDEMENT OF BUTTOCK WOUND;  Surgeon: Donnie Mesa, MD;  Location: [facility];  Service: General;  Laterality: Left;  . IRRIGATION AND DEBRIDEMENT BUTTOCKS Left 05/17/2019   Procedure: IRRIGATION AND DEBRIDEMENT BUTTOCKS;  Surgeon: Donnie Mesa, MD;  Location: [facility];  Service: General;  Laterality: Left;  . KNEE ARTHROSCOPY Right 07/17/2010  . LEFT HEART CATHETERIZATION WITH CORONARY ANGIOGRAM N/A 07/27/2012   Procedure: LEFT HEART CATHETERIZATION WITH CORONARY ANGIOGRAM;  Surgeon: Sherren Mocha, MD;  Location[facility] C[facility] LAB;  Service: Cardiovascular;  Laterality: N/A;  . MASS EXCISION N/A 06/29/2013   Procedure:  WIDE LOCAL EXCISION OF POSTERIOR NECK ABSCESS;  Surgeon: Ralene Ok, MD;  Location: [facility];  Service: General;  Laterality: N/A;  . REPAIR KNEE LIGAMENT Left    "fixed ligaments and chipped patella"  . right transmetatarsal amputation      Medication List:  Current Outpatient Medications  Medication Sig Dispense Refill  . Accu-Chek Softclix Lancets lancets Use to check blood glucose four times daily 100 each 12  . amLODipine (NORVASC) 10 MG tablet Take 1 tablet (10 mg total) by mouth daily. 30 tablet 0  .  atorvastatin (LIPITOR) 40 MG tablet Take 1 tablet (40 mg total) by mouth daily. 90 tablet 3  . budesonide-formoterol (SYMBICORT) 160-4.5 MCG/ACT inhaler Inhale 2 puffs into the lungs 2 (two) times daily. 10.2 g 4  . cetirizine (ZYRTEC) 10 MG tablet Take 10 mg by  mouth daily as needed for allergies.    . Cholecalciferol 1000 units tablet Take 1 tablet (1,000 Units total) by mouth daily. 90 tablet 0  . Continuous Blood Gluc Sensor (DEXCOM G6 SENSOR) MISC Inject 1 applicator into the skin as directed. Change sensor every 10 days. 3 each 11  . Continuous Blood Gluc Transmit (DEXCOM G6 TRANSMITTER) MISC Inject 1 Device into the skin as directed. Reuse 8 times with sensor changes. 1 each 3  . cyclobenzaprine (FLEXERIL) 5 MG tablet TAKE 1 TABLET BY MOUTH 3 TIMES DAILY AS NEEDED FOR MUSCLE SPASMS (Patient taking differently: Take 5 mg by mouth 3 (three) times daily as needed for muscle spasms. TAKE 1 TABLET BY MOUTH 3 TIMES DAILY AS NEEDED FOR MUSCLE SPASMS) 30 tablet 1  . gabapentin (NEURONTIN) 600 MG tablet TAKE 2 TABLETS BY MOUTH 3 TIMES DAILY (Patient taking differently: Take 600 mg by mouth 3 (three) times daily. ) 180 tablet 4  . glucose blood (ACCU-CHEK GUIDE) test strip USE T0 CHECK BLOOD SUGAR 3 TIMES DAILY 100 each 5  . HYDROcodone-acetaminophen (NORCO) 10-325 MG tablet TAKE 1 TABLET BY MOUTH EVERY SIX TO EIGHT HOURS AS NEEDED (Patient taking differently: 1 tablet every 6 (six) hours as needed for moderate pain. TAKE 1 TABLET BY MOUTH EVERY SIX TO EIGHT HOURS AS NEEDED) 100 tablet 0  . insulin isophane & regular human (HUMULIN 70/30 MIX) (70-30) 100 UNIT/ML KwikPen Inject 100 Units into the skin daily with breakfast AND 80 Units daily with supper.    . Insulin Pen Needle (SURE COMFORT PEN NEEDLES) 32G X 4 MM MISC Use to inject insulin as indicated 100 each 12  . Lancets (ACCU-CHEK MULTICLIX) lancets 1 each by Other route as needed for other. Use as instructed 100 each 1  . metoprolol succinate (TOPROL XL) 25 MG 24 hr tablet Take 1 tablet (25 mg total) by mouth daily. 30 tablet 11  . pantoprazole (PROTONIX) 40 MG tablet Take 1 tablet (40 mg total) by mouth daily. 30 tablet 1  . PROVENTIL HFA 108 (90 Base) MCG/ACT inhaler Inhale 1-2 puffs into the lungs every 6  (six) hours as needed for wheezing or shortness of breath. (Patient taking differently: Inhale 2 puffs into the lungs every 4 (four) hours as needed for wheezing. ) 6.7 g 5  . rOPINIRole (REQUIP) 1 MG tablet Take 1 tablet (1 mg total) by mouth at bedtime. 90 tablet 0  . terconazole (TERAZOL 7) 0.4 % vaginal cream Place 1 applicator vaginally at bedtime. 45 g 0  . VICTOZA 18 MG/3ML SOPN inject 0.6 MG SUBCUTANEOUSLY ONCE A DAY FOR 1 WEEK, THEN INCREASE TO 1.2 MG ONCE daily max 1.8 MG (Patient taking differently: Inject 0.6-1.2 mg into the skin See admin instructions. inject 0.6 MG SUBCUTANEOUSLY ONCE A DAY FOR 1 WEEK, THEN INCREASE TO 1.2 MG ONCE daily max 1.8 MG) 6 mL 3   No current facility-administered medications for this visit.   Allergies: Allergies  Allergen Reactions  . Cefepime Other (See Comments)    AKI, see records from [facility] hospitalization in January 2020.    Marland Kitchen Kiwi Extract Shortness Of Breath and Swelling  . Morphine And Related Nausea And Vomiting  . Trental [  Pentoxifylline] Nausea And Vomiting  . Nubain [Nalbuphine Hcl] Other (See Comments)    "FEELS LIKE SOMETHING CRAWLING ON ME"   Social History: Social History   Socioeconomic History  . Marital status: Married    Spouse name: Not on file  . Number of children: Not on file  . Years of education: Not on file  . Highest education level: Not on file  Occupational History  . Not on file  Tobacco Use  . Smoking status: Former Smoker    Packs/day: 0.30    Years: 0.30    Pack years: 0.09    Types: Cigarettes    Quit date: 12/06/1993    Years since quitting: 26.5  . Smokeless tobacco: Never Used  Vaping Use  . Vaping Use: Never used  Substance and Sexual Activity  . Alcohol use: No    Alcohol/week: 0.0 standard drinks  . Drug use: Yes    Comment: OD attempts on home meds    . Sexual activity: Yes    Partners: Male  Other Topics Concern  . Not on file  Social History Narrative   Lives in [place] with her fiance and  42 yr old dtr.   Social Determinants of Health   Financial Resource Strain:   . Difficulty of Paying Living Expenses:   Food Insecurity:   . Worried About Charity fundraiser in the Last Year:   . Arboriculturist in the Last Year:   Transportation Needs:   . Film/video editor (Medical):   Marland Kitchen Lack of Transportation (Non-Medical):   Physical Activity:   . Days of Exercise per Week:   . Minutes of Exercise per Session:   Stress:   . Feeling of Stress :   Social Connections:   . Frequency of Communication with Friends and Family:   . Frequency of Social Gatherings with Friends and Family:   . Attends Religious Services:   . Active Member of Clubs or Organizations:   . Attends Archivist Meetings:   Marland Kitchen Marital Status:    Lives in a ***. Smoking: *** Occupation: ***  Environmental HistoryFreight forwarder in the house: Estate agent in the family room: {Blank single:19197::"yes","no"} Carpet in the bedroom: {Blank single:19197::"yes","no"} Heating: {Blank single:19197::"electric","gas"} Cooling: {Blank single:19197::"central","window"} Pet: {Blank single:19197::"yes ***","no"}  Family History: Family History  Problem Relation Age of Onset  . Diabetes Mother   . Hyperlipidemia Mother   . Depression Mother   . GER disease Mother   . Allergic rhinitis Mother   . Restless legs syndrome Mother   . Heart attack Paternal Uncle   . Heart disease Paternal Grandmother   . Heart attack Paternal Grandmother   . Heart attack Paternal Grandfather   . Heart disease Paternal Grandfather   . Heart attack Father   . Migraines Sister   . Cancer Maternal Grandmother        COLON  . Hypertension Maternal Grandmother   . Hyperlipidemia Maternal Grandmother   . Diabetes Maternal Grandmother   . Other Maternal Grandfather        GUN SHOT  . Anxiety disorder Sister    Problem                               Relation Asthma                                    ***  Eczema                                *** Food allergy                          *** Allergic rhino conjunctivitis     ***  Review of Systems  Constitutional: Negative for appetite change, chills, fever and unexpected weight change.  HENT: Negative for congestion and rhinorrhea.   Eyes: Negative for itching.  Respiratory: Negative for cough, chest tightness, shortness of breath and wheezing.   Cardiovascular: Negative for chest pain.  Gastrointestinal: Negative for abdominal pain.  Genitourinary: Negative for difficulty urinating.  Skin: Negative for rash.  Neurological: Negative for headaches.   Objective: There were no vitals taken for this visit. There is no height or weight on file to calculate BMI. Physical Exam Vitals and nursing note reviewed.  Constitutional:      Appearance: Normal appearance. She is well-developed.  HENT:     Head: Normocephalic and atraumatic.     Right Ear: External ear normal.     Left Ear: External ear normal.     Nose: Nose normal.     Mouth/Throat:     Mouth: Mucous membranes are moist.     Pharynx: Oropharynx is clear.  Eyes:     Conjunctiva/sclera: Conjunctivae normal.  Cardiovascular:     Rate and Rhythm: Normal rate and regular rhythm.     Heart sounds: Normal heart sounds. No murmur heard.  No friction rub. No gallop.   Pulmonary:     Effort: Pulmonary effort is normal.     Breath sounds: Normal breath sounds. No wheezing, rhonchi or rales.  Abdominal:     Palpations: Abdomen is soft.  Musculoskeletal:     Cervical back: Neck supple.  Skin:    General: Skin is warm.     Findings: No rash.  Neurological:     Mental Status: She is alert and oriented to person, place, and time.  Psychiatric:        Behavior: Behavior normal.    The plan was reviewed with the patient/family, and all questions/concerned were addressed.  It was my pleasure to see Suni today and participate in her care. Please feel free to contact me  with any questions or concerns.  Sincerely,  Rexene Alberts, DO Allergy & Immunology  [facility] office: [phone] [address] [facility] office: [place] office: [phone]

## 2020-06-16 NOTE — Discharge Planning (Signed)
RNCM confirmed with [facility] that all home equipment has been delivered to pt home.

## 2020-06-16 NOTE — Social Work (Signed)
Consult request has been received. CSW attempting to follow up at present time. °  °CSW will continue to follow for dc needs. ° °Allure Greaser Tarpley-Carter, MSW, LCSW-A °[place] [facility] °Transitions of Care Clinical Social Worker °[place] °[phone] °

## 2020-06-16 NOTE — Social Work (Signed)
CSW spoke with [name] at [facility].  [name] will be sending an e-tank for pt.  The tech will also show pt how to properly use e-tank.  Please reconsult if new social work issues arise, CSW signing off.  Dalonte Hardage Tarpley-Carter, MSW, [place] [facility] Transitions of Engineer, building services Health [phone]

## 2020-06-17 ENCOUNTER — Ambulatory Visit (INDEPENDENT_AMBULATORY_CARE_PROVIDER_SITE_OTHER): Payer: Medicaid Other | Admitting: Family Medicine

## 2020-06-17 ENCOUNTER — Inpatient Hospital Stay (HOSPITAL_COMMUNITY)
Admission: AD | Admit: 2020-06-17 | Discharge: 2020-06-23 | DRG: 189 | Disposition: A | Discharge status: Home Health | Payer: Medicaid Other | Source: Ambulatory Visit | Attending: Family Medicine | Admitting: Family Medicine

## 2020-06-17 ENCOUNTER — Encounter: Payer: Self-pay | Admitting: Family Medicine

## 2020-06-17 VITALS — BP 140/80 | HR 92

## 2020-06-17 DIAGNOSIS — E785 Hyperlipidemia, unspecified: Secondary | ICD-10-CM | POA: Diagnosis present

## 2020-06-17 DIAGNOSIS — J9601 Acute respiratory failure with hypoxia: Secondary | ICD-10-CM | POA: Diagnosis not present

## 2020-06-17 DIAGNOSIS — I10 Essential (primary) hypertension: Secondary | ICD-10-CM | POA: Diagnosis present

## 2020-06-17 DIAGNOSIS — E1140 Type 2 diabetes mellitus with diabetic neuropathy, unspecified: Secondary | ICD-10-CM | POA: Diagnosis not present

## 2020-06-17 DIAGNOSIS — Z87891 Personal history of nicotine dependence: Secondary | ICD-10-CM

## 2020-06-17 DIAGNOSIS — I272 Pulmonary hypertension, unspecified: Secondary | ICD-10-CM | POA: Diagnosis present

## 2020-06-17 DIAGNOSIS — B974 Respiratory syncytial virus as the cause of diseases classified elsewhere: Secondary | ICD-10-CM | POA: Diagnosis present

## 2020-06-17 DIAGNOSIS — R0602 Shortness of breath: Secondary | ICD-10-CM | POA: Diagnosis not present

## 2020-06-17 DIAGNOSIS — G894 Chronic pain syndrome: Secondary | ICD-10-CM | POA: Diagnosis present

## 2020-06-17 DIAGNOSIS — J9611 Chronic respiratory failure with hypoxia: Secondary | ICD-10-CM | POA: Diagnosis present

## 2020-06-17 DIAGNOSIS — E1165 Type 2 diabetes mellitus with hyperglycemia: Secondary | ICD-10-CM | POA: Diagnosis present

## 2020-06-17 DIAGNOSIS — J9621 Acute and chronic respiratory failure with hypoxia: Principal | ICD-10-CM | POA: Diagnosis present

## 2020-06-17 DIAGNOSIS — E1142 Type 2 diabetes mellitus with diabetic polyneuropathy: Secondary | ICD-10-CM | POA: Diagnosis present

## 2020-06-17 DIAGNOSIS — J449 Chronic obstructive pulmonary disease, unspecified: Secondary | ICD-10-CM | POA: Diagnosis present

## 2020-06-17 DIAGNOSIS — Z79891 Long term (current) use of opiate analgesic: Secondary | ICD-10-CM

## 2020-06-17 DIAGNOSIS — Z6841 Body Mass Index (BMI) 40.0 and over, adult: Secondary | ICD-10-CM

## 2020-06-17 DIAGNOSIS — R0902 Hypoxemia: Secondary | ICD-10-CM | POA: Diagnosis present

## 2020-06-17 DIAGNOSIS — Z794 Long term (current) use of insulin: Secondary | ICD-10-CM

## 2020-06-17 DIAGNOSIS — F3181 Bipolar II disorder: Secondary | ICD-10-CM | POA: Diagnosis present

## 2020-06-17 DIAGNOSIS — Z89512 Acquired absence of left leg below knee: Secondary | ICD-10-CM

## 2020-06-17 DIAGNOSIS — Z7951 Long term (current) use of inhaled steroids: Secondary | ICD-10-CM

## 2020-06-17 DIAGNOSIS — E872 Acidosis: Secondary | ICD-10-CM | POA: Diagnosis present

## 2020-06-17 DIAGNOSIS — Z79899 Other long term (current) drug therapy: Secondary | ICD-10-CM

## 2020-06-17 DIAGNOSIS — E662 Morbid (severe) obesity with alveolar hypoventilation: Secondary | ICD-10-CM | POA: Diagnosis present

## 2020-06-17 DIAGNOSIS — J9622 Acute and chronic respiratory failure with hypercapnia: Secondary | ICD-10-CM | POA: Diagnosis present

## 2020-06-17 DIAGNOSIS — G2581 Restless legs syndrome: Secondary | ICD-10-CM | POA: Diagnosis present

## 2020-06-17 LAB — CBC
HCT: 36.5 % (ref 36.0–46.0)
Hemoglobin: 11.1 g/dL — ABNORMAL LOW (ref 12.0–15.0)
MCH: 28.8 pg (ref 26.0–34.0)
MCHC: 30.4 g/dL (ref 30.0–36.0)
MCV: 94.6 fL (ref 80.0–100.0)
Platelets: 341 10*3/uL (ref 150–400)
RBC: 3.86 MIL/uL — ABNORMAL LOW (ref 3.87–5.11)
RDW: 13.5 % (ref 11.5–15.5)
WBC: 8.1 10*3/uL (ref 4.0–10.5)
nRBC: 0 % (ref 0.0–0.2)

## 2020-06-17 LAB — COMPREHENSIVE METABOLIC PANEL
ALT: 15 U/L (ref 0–44)
AST: 9 U/L — ABNORMAL LOW (ref 15–41)
Albumin: 2.8 g/dL — ABNORMAL LOW (ref 3.5–5.0)
Alkaline Phosphatase: 91 U/L (ref 38–126)
Anion gap: 11 (ref 5–15)
BUN: 26 mg/dL — ABNORMAL HIGH (ref 6–20)
CO2: 27 mmol/L (ref 22–32)
Calcium: 8.7 mg/dL — ABNORMAL LOW (ref 8.9–10.3)
Chloride: 99 mmol/L (ref 98–111)
Creatinine, Ser: 0.97 mg/dL (ref 0.44–1.00)
GFR calc Af Amer: >60 mL/min (ref >60)
GFR calc non Af Amer: >60 mL/min (ref >60)
Glucose, Bld: 240 mg/dL — ABNORMAL HIGH (ref 70–99)
Potassium: 3.9 mmol/L (ref 3.5–5.1)
Sodium: 137 mmol/L (ref 135–145)
Total Bilirubin: 0.3 mg/dL (ref 0.3–1.2)
Total Protein: 6.9 g/dL (ref 6.5–8.1)

## 2020-06-17 LAB — BLOOD GAS, ARTERIAL
Acid-Base Excess: 2.8 mmol/L — ABNORMAL HIGH (ref 0.0–2.0)
Bicarbonate: 28.6 mmol/L — ABNORMAL HIGH (ref 20.0–28.0)
Drawn by: 560031
FIO2: 36
O2 Saturation: 95.3 %
Patient temperature: 37
pCO2 arterial: 59.2 mmHg — ABNORMAL HIGH (ref 32.0–48.0)
pH, Arterial: 7.306 — ABNORMAL LOW (ref 7.350–7.450)
pO2, Arterial: 80.9 mmHg — ABNORMAL LOW (ref 83.0–108.0)

## 2020-06-17 LAB — BRAIN NATRIURETIC PEPTIDE: B Natriuretic Peptide: 34 pg/mL (ref 0.0–100.0)

## 2020-06-17 LAB — TSH: TSH: 2.852 u[IU]/mL (ref 0.350–4.500)

## 2020-06-17 LAB — GLUCOSE, CAPILLARY: Glucose-Capillary: 307 mg/dL — ABNORMAL HIGH (ref 70–99)

## 2020-06-17 MED ORDER — INSULIN ASPART 100 UNIT/ML ~~LOC~~ SOLN
0.0000 [IU] | Freq: Three times a day (TID) | SUBCUTANEOUS | Status: DC
  Administered 2020-06-18: 5 [IU] via SUBCUTANEOUS

## 2020-06-17 MED ORDER — POLYETHYLENE GLYCOL 3350 17 G PO PACK
17.0000 g | PACK | Freq: Every day | ORAL | Status: DC | PRN
  Administered 2020-06-18: 17 g via ORAL
  Filled 2020-06-17: qty 1

## 2020-06-17 MED ORDER — PANTOPRAZOLE SODIUM 40 MG PO TBEC
40.0000 mg | DELAYED_RELEASE_TABLET | Freq: Every day | ORAL | Status: DC
  Administered 2020-06-18 – 2020-06-23 (×6): 40 mg via ORAL
  Filled 2020-06-17 (×8): qty 1

## 2020-06-17 MED ORDER — INSULIN ASPART 100 UNIT/ML ~~LOC~~ SOLN
0.0000 [IU] | Freq: Every day | SUBCUTANEOUS | Status: DC
  Administered 2020-06-17: 4 [IU] via SUBCUTANEOUS
  Administered 2020-06-19: 5 [IU] via SUBCUTANEOUS
  Administered 2020-06-21: 3 [IU] via SUBCUTANEOUS
  Administered 2020-06-22: 4 [IU] via SUBCUTANEOUS

## 2020-06-17 MED ORDER — PREDNISONE 20 MG PO TABS
40.0000 mg | ORAL_TABLET | Freq: Every day | ORAL | Status: AC
  Administered 2020-06-17 – 2020-06-22 (×5): 40 mg via ORAL
  Filled 2020-06-17 (×7): qty 2

## 2020-06-17 MED ORDER — CYCLOBENZAPRINE HCL 10 MG PO TABS: 5.0000 mg | ORAL_TABLET | Freq: Three times a day (TID) | ORAL | Status: DC | PRN

## 2020-06-17 MED ORDER — AMLODIPINE BESYLATE 10 MG PO TABS
10.0000 mg | ORAL_TABLET | Freq: Every day | ORAL | Status: DC
  Administered 2020-06-18 – 2020-06-23 (×6): 10 mg via ORAL
  Filled 2020-06-17 (×6): qty 1

## 2020-06-17 MED ORDER — ENOXAPARIN SODIUM 80 MG/0.8ML ~~LOC~~ SOLN
75.0000 mg | SUBCUTANEOUS | Status: DC
  Administered 2020-06-18 – 2020-06-22 (×5): 75 mg via SUBCUTANEOUS
  Filled 2020-06-17 (×5): qty 0.8

## 2020-06-17 MED ORDER — HYDROCODONE-ACETAMINOPHEN 10-325 MG PO TABS
1.0000 | ORAL_TABLET | Freq: Four times a day (QID) | ORAL | Status: DC | PRN
  Administered 2020-06-17 – 2020-06-19 (×6): 1 via ORAL
  Filled 2020-06-17 (×6): qty 1

## 2020-06-17 MED ORDER — FUROSEMIDE 10 MG/ML IJ SOLN
40.0000 mg | Freq: Once | INTRAMUSCULAR | Status: AC
Start: 2020-06-17 — End: 2020-06-17
  Administered 2020-06-17: 40 mg via INTRAMUSCULAR

## 2020-06-17 MED ORDER — ACETAMINOPHEN 325 MG PO TABS
650.0000 mg | ORAL_TABLET | Freq: Four times a day (QID) | ORAL | Status: DC | PRN
  Administered 2020-06-19: 650 mg via ORAL
  Filled 2020-06-17: qty 2

## 2020-06-17 MED ORDER — ROPINIROLE HCL 1 MG PO TABS
1.0000 mg | ORAL_TABLET | Freq: Every day | ORAL | Status: DC
  Administered 2020-06-17 – 2020-06-22 (×6): 1 mg via ORAL
  Filled 2020-06-17 (×6): qty 1

## 2020-06-17 MED ORDER — GABAPENTIN 600 MG PO TABS
600.0000 mg | ORAL_TABLET | Freq: Three times a day (TID) | ORAL | Status: DC
  Administered 2020-06-17 – 2020-06-22 (×14): 600 mg via ORAL
  Filled 2020-06-17 (×14): qty 1

## 2020-06-17 MED ORDER — METOPROLOL SUCCINATE ER 25 MG PO TB24
25.0000 mg | ORAL_TABLET | Freq: Every day | ORAL | Status: DC
  Administered 2020-06-18 – 2020-06-23 (×6): 25 mg via ORAL
  Filled 2020-06-17 (×6): qty 1

## 2020-06-17 MED ORDER — IPRATROPIUM-ALBUTEROL 0.5-2.5 (3) MG/3ML IN SOLN
3.0000 mL | RESPIRATORY_TRACT | Status: DC
  Administered 2020-06-17 – 2020-06-18 (×3): 3 mL via RESPIRATORY_TRACT
  Filled 2020-06-17 (×4): qty 3

## 2020-06-17 MED ORDER — FUROSEMIDE 10 MG/ML IJ SOLN
40.0000 mg | Freq: Once | INTRAMUSCULAR | Status: AC
  Administered 2020-06-18: 40 mg via INTRAVENOUS
  Filled 2020-06-17: qty 4

## 2020-06-17 MED ORDER — ATORVASTATIN CALCIUM 40 MG PO TABS
40.0000 mg | ORAL_TABLET | Freq: Every day | ORAL | Status: DC
  Administered 2020-06-18 – 2020-06-23 (×6): 40 mg via ORAL
  Filled 2020-06-17 (×6): qty 1

## 2020-06-17 MED ORDER — ACETAMINOPHEN 650 MG RE SUPP: 650.0000 mg | Freq: Four times a day (QID) | RECTAL | Status: DC | PRN

## 2020-06-17 MED ORDER — MOMETASONE FURO-FORMOTEROL FUM 200-5 MCG/ACT IN AERO
2.0000 | INHALATION_SPRAY | Freq: Two times a day (BID) | RESPIRATORY_TRACT | Status: DC
  Administered 2020-06-18 – 2020-06-23 (×11): 2 via RESPIRATORY_TRACT
  Filled 2020-06-17: qty 8.8

## 2020-06-17 NOTE — Addendum Note (Signed)
Addended by: [name] on: 06/17/2020 10:23 PM   Modules accepted: Level of Service

## 2020-06-17 NOTE — H&P (Signed)
[facility] Admission History and Physical Service Pager: [phone]  Patient name: [name] Medical record number: [id number] Date of birth: 1976/01/16 Age: 44 y.o. Gender: female  Primary Care Provider: [name], MD Consultants: None Code Status: Full Preferred Emergency Contact: Sister, [phone]  Chief Complaint: Shortness of breath  Assessment and Plan: [name] is a 44 y.o. female presenting as a direct admission for acute hypoxic respiratory failure in the setting of 1 month history with worsening shortness of breath. PMH is significant for poorly controlled type 2 diabetes, depression, OSA, hypertension, morbid obesity, OHS, left BKA  Acute hypoxic, hypercarbic respiratory failure: Worsening. Approximate one month history of sudden onset worsening dyspnea, now requiring 4L [place] at all times.  Recently admitted on 7/10, felt respiratory status may be 2/2 OHS after unremarkable echo, BNP, and CTA (technically poor study). Otherwise in the setting of previously well-controlled mild asthma, rarely using rescue inhaler. Unclear etiology, given such significant progression do not believe that her entire clinical presentation could be attributed to OHS solely but could certainly be contributing (appears chronically hypercarbic). Could consider asthma exacerbation, HFpEF, ILD, PE.  Interestingly she reports her symptoms started after moving into a new apartment where she has been concerned for mold, ?Mold associated ILD. She does have mild expiratory wheezing and respiratory acidosis present on ABG, will treat possible asthma component.  Additionally given reported orthopnea and worsening LE swelling, will trial diuresis and R/O DVT that may increase suspicion of PE if present. -Admit to medical telemetry, attending Dr. Ardelia Hall -Will consult pulmonology tomorrow for additional assistance -Obtain R DVT U/S -S/p Lasix IM earlier today, will trial Lasix  40 IV in am and assess response -DuoNebs q4 -Prednisone 40 mg daily (will monitor glucose closely)  -Oxygen therapy as needed  Atypical chest pain: Chronic, stable.  Intermittently present for the past several months, has already seen her cardiologist outpatient for this and had a low risk Lexiscan in 05/2020 suggesting against cardiac etiology.  Not currently present during evaluation.  Interestingly again started after moving into her new apartment, may be manifestation of above.  Awaiting updated EKG on admit, otherwise no ST changes during recent hospitalization.  Troponin 11-13 consistently on several checks through 05/12/2020. -EKG -Monitor for recurrence  Mild asthma: Chronic. Previously known to be well controlled, her current respiratory status for the past month is unusual for her. -Prednisone and breathing treatments as above -Continue home Symbicort, formulary Dulera on instead  T2DM: Chronic, uncontrolled. A1c 12.7 on 06/14/2020.  Following closely with her PCP and Dr. Valentina Hall within [facility] clinic to improve this.  Currently takes Humulin 70/30 100U in am and 80U in pm, endorses compliance. -CBGs with meals and nightly -Continue home Humulin, will decrease dose given diet change -SSI if needed  Hypertension: Chronic, stable. BP 147/101 on arrival.  Takes Norvasc and metoprolol at home. -Continue home Norvasc and metoprolol -Monitor BP  Hyperlipidemia: Chronic, stable. Continue home Lipitor 40 mg.  OSA: Chronic, stable. Wears CPAP at night, has not been able to do this due to new oxygen requirement with machine at home. -Order CPAP, assess if can add O2 with it here  Morbid obesity: Chronic, stable. BMI 61.  Will qualify for weight reduction surgical intervention if desired. -Follow-up with PCP outpatient  Chronic pain syndrome: Stable.  Takes gabapentin, Flexeril, and Norco as needed at home.  PMP aware reviewed and appropriate. -Continue home gabapentin and Norco 10-325 q6  PRN  -Continue home Flexeril  Restless  leg syndrome: Chronic, stable. Takes ropinirole at bedtime.    FEN/GI: Carb modified diet Prophylaxis: Lovenox  Disposition: Admit to medical telemetry, attending Dr. Ardelia Hall  History of Present Illness:  [name] is a 44 y.o. female presenting as a direct admission at request of her PCP, Dr. Ardelia Hall, for further evaluation of acute hypoxic respiratory failure.  She reports significantly increased dyspnea for approximately the past 1 month, in the setting of a several month history of intermittent chest pains.  Prior to this did not struggle with any shortness of breath, had well-controlled mild intermittent asthma rarely necessitating albuterol use.  However, since this time she has been into the ED 6 times and had a brief hospital stay from 7/10-11 at [facility].  Felt that her worsening respiratory status was in the setting of OHS during this admit after normal CTA and echo, sent home with 4 L [place].  No oxygen requirement prior to this. She feels like this suddenly started in the night 1 month ago. She has been also having chest pains "every so often." Feels like someone is squeezing her in the middle of her chest. Saw SVT on her EKG during one of these ED visits.  She did have a low risk Lexiscan with her cardiologist in 05/2020.  She reports she thinks she has mold in her house, she moved there about 6 months (11/2020) and that is when all of her troubles above started.  Denies any significant alcohol use.  Smoked for about 6 months in her life several years ago.  No other illicit drug use.  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  After discharge, she continues to feel extremely dyspneic at rest and with activity, worse with laying flat.  She has noticed increase swelling in her right  lower extremity, is s/p BKA on the left.  She also has noticed swelling of her abdomen with thickened skin in the past day or so, not sure when this started.  Saw her PCP, Dr. Ardelia Hall, today and was given IM Lasix.  She has had good diuresis with this however has not noticed much difference in her breathing yet.  Review Of Systems: Per HPI with the following additions:   Review of Systems  Constitutional: Negative for chills, fatigue and fever.  Respiratory: Positive for cough and shortness of breath.   Cardiovascular: Positive for chest pain and leg swelling. Negative for palpitations.  Gastrointestinal: Negative for abdominal pain, constipation, diarrhea, nausea and vomiting.  Neurological: Negative for dizziness, weakness, light-headedness, numbness and headaches.     Patient Active Problem List   Diagnosis Date Noted  . Hypoxia 06/17/2020  . Acute hypoxemic respiratory failure ([place]) 06/15/2020  . Acute respiratory failure with hypoxia ([place]) 06/14/2020  . Blister of foot 04/21/2020  . Exposure to mold 04/21/2020  . Constipation 04/03/2020  . Status post amputation of toe of right foot ([place]) 02/13/2020  . UTI (urinary tract infection) 06/28/2019  . Amputation above knee ([place]) 06/23/2019  . Left shoulder pain 06/23/2019  . Abscess of buttock, right 05/17/2019  . Severe protein-calorie malnutrition ([place])   . 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[place]) 04/11/2019  . Type 2 diabetes mellitus with diabetic polyneuropathy, with long-term current use of insulin ([place]) 04/11/2019  . Uncontrolled type 2 diabetes mellitus with insulin therapy ([place]) 04/16/2018  . Allergic  rhinitis 03/06/2018  . Unilateral complete BKA, left, sequela ([place])   . Class 3 severe obesity due to excess calories with serious comorbidity and body mass index (BMI) of 50.0 to 59.9 in adult [facility])   . AKI (acute kidney injury) ([place])   . Decreased pedal pulses 06/10/2017  . Unilateral primary osteoarthritis, right knee 10/22/2016  .  Primary osteoarthritis of first carpometacarpal joint of left hand 07/30/2016  . Diabetic neuropathy ([place]) 07/14/2016  . Nausea 03/17/2016  . De Quervain's tenosynovitis, bilateral 11/01/2015  . Vitamin D deficiency 09/05/2015  . Recurrent candidiasis of vagina 09/05/2015  . Varicose veins of leg with complications 40/98/1191  . Restless leg syndrome 10/17/2014  . Chronic sinusitis 07/18/2014  . Encounter for chronic pain management 06/30/2013  . HLD (hyperlipidemia) 11/19/2012  . Chest pain 06/27/2012  . Right carpal tunnel syndrome 09/01/2011  . Bilateral knee pain 09/01/2011  . DM (diabetes mellitus) type II uncontrolled, periph vascular disorder ([place]) 05/22/2008  . Morbid obesity ([place]) 05/22/2008  . Obesity hypoventilation syndrome ([place]) 05/22/2008  . Depression 05/22/2008  . Obstructive sleep apnea 05/22/2008  . Hypertension 05/22/2008  . Asthma 05/22/2008  . GERD 05/22/2008    Past Medical History: Past Medical History:  Diagnosis Date  . Acute osteomyelitis of ankle or foot, left ([place]) 01/31/2018  . Alveolar hypoventilation   . Anemia    not on iron pill  . Asthma   . Bipolar 2 disorder ([place])   . Carpal tunnel syndrome on right    recurrent  . Cellulitis 08/2010-08/2011  . Chronic pain   . COPD (chronic obstructive pulmonary disease) ([name])    Symbicort daily and Proventil as needed  . Costochondritis   . Depression   . Diabetes mellitus type II, uncontrolled ([place]) 2000   Type 2, Uncontrolled.Takes Lantus daily.Fasting blood sugar runs 150  . Dizziness    occasionally  . Drug-seeking behavior   . GERD (gastroesophageal reflux disease)    takes Pantoprazole and Zantac daily  . Headache    migraine-last one about a yr ago.Topamax daily  . HLD (hyperlipidemia)    takes Atorvastatin daily  . Hypertension    takes Lisinopril and Coreg daily  . Morbid obesity ([place])   . Muscle spasm    takes Flexeril as needed  . Nocturia   . OSA on CPAP   . Peripheral  neuropathy    takes Gabapentin daily  . Pneumonia    "walking" several yrs ago and as a baby (12/05/2018)  . Rectal fissure   . Restless leg   . SVT (supraventricular tachycardia) ([place])   . Syncope 02/25/2016  . Urinary frequency   . Varicose veins    Right medial thigh and Left leg     Past Surgical History: Past Surgical History:  Procedure Laterality Date  . AMPUTATION Left 02/01/2018   Procedure: LEFT FOURTH AND 5TH TOE RAY AMPUTATION;  Surgeon: [name], MD;  Location: [place];  Service: Orthopedics;  Laterality: Left;  . AMPUTATION Left 03/03/2018   Procedure: LEFT BELOW KNEE AMPUTATION;  Surgeon: [name], MD;  Location: [place];  Service: Orthopedics;  Laterality: Left;  . CARPAL TUNNEL RELEASE Bilateral   . CESAREAN SECTION  2007  . INCISION AND DRAINAGE PERIRECTAL ABSCESS Left 05/18/2019   Procedure: IRRIGATION AND DEBRIDEMENT OF PANNIS ABSCESS, POSSIBLE DEBRIDEMENT OF BUTTOCK WOUND;  Surgeon: Donnie Mesa, MD;  Location: [place];  Service: General;  Laterality: Left;  . IRRIGATION AND DEBRIDEMENT BUTTOCKS Left 05/17/2019  Procedure: IRRIGATION AND DEBRIDEMENT BUTTOCKS;  Surgeon: Donnie Mesa, MD;  Location: [place];  Service: General;  Laterality: Left;  . KNEE ARTHROSCOPY Right 07/17/2010  . LEFT HEART CATHETERIZATION WITH CORONARY ANGIOGRAM N/A 07/27/2012   Procedure: LEFT HEART CATHETERIZATION WITH CORONARY ANGIOGRAM;  Surgeon: Sherren Mocha, MD;  Location: [facility] CATH LAB;  Service: Cardiovascular;  Laterality: N/A;  . MASS EXCISION N/A 06/29/2013   Procedure:  WIDE LOCAL EXCISION OF POSTERIOR NECK ABSCESS;  Surgeon: Ralene Ok, MD;  Location: [name];  Service: General;  Laterality: N/A;  . REPAIR KNEE LIGAMENT Left    "fixed ligaments and chipped patella"  . right transmetatarsal amputation       Social History: Social History   Tobacco Use  . Smoking status: Former Smoker    Packs/day: 0.30    Years: 0.30    Pack years: 0.09    Types: Cigarettes    Quit  date: 12/06/1993    Years since quitting: 26.5  . Smokeless tobacco: Never Used  Vaping Use  . Vaping Use: Never used  Substance Use Topics  . Alcohol use: No    Alcohol/week: 0.0 standard drinks  . Drug use: Yes    Comment: OD attempts on home meds     Additional social history: Non-smoker.  Lives with her daughter, does have a home health aide that comes in. Please also refer to relevant sections of EMR.  Family History: Family History  Problem Relation Age of Onset  . Diabetes Mother   . Hyperlipidemia Mother   . Depression Mother   . GER disease Mother   . Allergic rhinitis Mother   . Restless legs syndrome Mother   . Heart attack Paternal Uncle   . Heart disease Paternal Grandmother   . Heart attack Paternal Grandmother   . Heart attack Paternal Grandfather   . Heart disease Paternal Grandfather   . Heart attack Father   . Migraines Sister   . Cancer Maternal Grandmother        COLON  . Hypertension Maternal Grandmother   . Hyperlipidemia Maternal Grandmother   . Diabetes Maternal Grandmother   . Other Maternal Grandfather        GUN SHOT  . Anxiety disorder Sister     Allergies and Medications: Allergies  Allergen Reactions  . Cefepime Other (See Comments)    AKI, see records from [place] hospitalization in January 2020.    Marland Kitchen Kiwi Extract Shortness Of Breath and Swelling  . Morphine And Related Nausea And Vomiting  . Trental [Pentoxifylline] Nausea And Vomiting  . Nubain [Nalbuphine Hcl] Other (See Comments)    "FEELS LIKE SOMETHING CRAWLING ON ME"   No current facility-administered medications on file prior to encounter.   Current Outpatient Medications on File Prior to Encounter  Medication Sig Dispense Refill  . Accu-Chek Softclix Lancets lancets Use to check blood glucose four times daily 100 each 12  . amLODipine (NORVASC) 10 MG tablet Take 1 tablet (10 mg total) by mouth daily. 30 tablet 0  . atorvastatin (LIPITOR) 40 MG tablet Take 1 tablet (40 mg  total) by mouth daily. 90 tablet 3  . budesonide-formoterol (SYMBICORT) 160-4.5 MCG/ACT inhaler Inhale 2 puffs into the lungs 2 (two) times daily. 10.2 g 4  . Cholecalciferol 1000 units tablet Take 1 tablet (1,000 Units total) by mouth daily. 90 tablet 0  . Continuous Blood Gluc Sensor (DEXCOM G6 SENSOR) MISC Inject 1 applicator into the skin as directed. Change sensor every 10 days. 3 each  11  . Continuous Blood Gluc Transmit (DEXCOM G6 TRANSMITTER) MISC Inject 1 Device into the skin as directed. Reuse 8 times with sensor changes. 1 each 3  . cyclobenzaprine (FLEXERIL) 5 MG tablet TAKE 1 TABLET BY MOUTH 3 TIMES DAILY AS NEEDED FOR MUSCLE SPASMS (Patient taking differently: Take 5 mg by mouth 3 (three) times daily as needed for muscle spasms. TAKE 1 TABLET BY MOUTH 3 TIMES DAILY AS NEEDED FOR MUSCLE SPASMS) 30 tablet 1  . gabapentin (NEURONTIN) 600 MG tablet TAKE 2 TABLETS BY MOUTH 3 TIMES DAILY (Patient taking differently: Take 600 mg by mouth 3 (three) times daily. ) 180 tablet 4  . glucose blood (ACCU-CHEK GUIDE) test strip USE T0 CHECK BLOOD SUGAR 3 TIMES DAILY 100 each 5  . HYDROcodone-acetaminophen (NORCO) 10-325 MG tablet TAKE 1 TABLET BY MOUTH EVERY SIX TO EIGHT HOURS AS NEEDED (Patient taking differently: 1 tablet every 6 (six) hours as needed for moderate pain. TAKE 1 TABLET BY MOUTH EVERY SIX TO EIGHT HOURS AS NEEDED) 100 tablet 0  . insulin isophane & regular human (HUMULIN 70/30 MIX) (70-30) 100 UNIT/ML KwikPen Inject 100 Units into the skin daily with breakfast AND 80 Units daily with supper.    . Insulin Pen Needle (SURE COMFORT PEN NEEDLES) 32G X 4 MM MISC Use to inject insulin as indicated 100 each 12  . Lancets (ACCU-CHEK MULTICLIX) lancets 1 each by Other route as needed for other. Use as instructed 100 each 1  . metoprolol succinate (TOPROL XL) 25 MG 24 hr tablet Take 1 tablet (25 mg total) by mouth daily. 30 tablet 11  . pantoprazole (PROTONIX) 40 MG tablet Take 1 tablet (40 mg  total) by mouth daily. 30 tablet 1  . PROVENTIL HFA 108 (90 Base) MCG/ACT inhaler Inhale 1-2 puffs into the lungs every 6 (six) hours as needed for wheezing or shortness of breath. (Patient taking differently: Inhale 2 puffs into the lungs every 4 (four) hours as needed for wheezing. ) 6.7 g 5  . rOPINIRole (REQUIP) 1 MG tablet Take 1 tablet (1 mg total) by mouth at bedtime. 90 tablet 0  . VICTOZA 18 MG/3ML SOPN inject 0.6 MG SUBCUTANEOUSLY ONCE A DAY FOR 1 WEEK, THEN INCREASE TO 1.2 MG ONCE daily max 1.8 MG (Patient taking differently: Inject 0.6-1.2 mg into the skin See admin instructions. inject 0.6 MG SUBCUTANEOUSLY ONCE A DAY FOR 1 WEEK, THEN INCREASE TO 1.2 MG ONCE daily max 1.8 MG) 6 mL 3    Objective: BP (!) 147/101   Pulse 89   Temp 97.8 F (36.6 C) (Oral)   Resp (!) 22   SpO2 99%  Exam: General: Alert, no acute distress, daughter at bedside Eyes: Nonscleral conjunctival.  PERRLA, EOMI ENTM: Mucous membranes moist, nasal cannula in place Neck: Supple Cardiovascular: regular rate and rhythm, no murmurs appreciated Respiratory: Faint expiratory wheeze anteriorly with prolonged expiratory phase, otherwise relatively clear with mild rhonchi bibasilarly.  Pulmonary exam limited by body habitus.  Currently satting appropriately on 4L, can speak in full sentences however has to stop to take in 1 to 2 breaths. Gastrointestinal: Soft, nontender to palpation in all 4 quadrants.  Normoactive bowel sounds. MSK: S/p BKA on the left.  1+ pitting edema to midshin on the right. Derm: No rash seen. Neuro: Alert and oriented.  Speech normal.  Able to follow commands.  Can move all extremities spontaneously. Psych: Normal mood and affect  Labs and Imaging: CBC BMET  Recent Labs  Lab 06/15/20  0636  WBC 12.4*  HGB 11.0*  HCT 36.0  PLT 352   Recent Labs  Lab 06/15/20 0636  NA 136  K 5.1  CL 99  CO2 26  BUN 49*  CREATININE 1.13*  GLUCOSE 261*  CALCIUM 9.2     Patriciaann Clan,  DO 06/17/2020, 5:33 PM PGY-3, [place] Intern pager: [phone], text pages welcome

## 2020-06-17 NOTE — Progress Notes (Signed)
Date of Visit: 06/17/2020   SUBJECTIVE:   HPI:  [name] presents today for hospital follow up.  Since I last saw her on June 8th, she has been to the ED 6 times for dyspnea. She was admitted to [facility] on 7/10 with a new oxygen requirement and discharged home the following day on 4L of oxygen. Her inpatient workup included a CTA chest, which did not demonstrate a PE but showed possible mild edema vs air trapping. Also had an echo which was quite limited due to habitus but demonstrated normal LV systolic & diastolic function. Working diagnosis at that time was obesity hypoventilation syndrome. Was not able to be set up prior to discharge with a CPAP machine that allows oxygen to be simultaneously administered, so she has not been able to use her regular CPAP machine at home, because she has to wear the nasal canula for oxygen administration.  Since discharge from the hospital 2 days ago, she has struggled. Cannot lay flat to sleep due to her breathing. When she sits up in the chair to sleep, her legs hang down and are uncomfortable and that keeps her awake. Feels tired. No fevers. Has had cough. Was never treated with prednisone or steroids for her breathing during the last few days. Previously had a hospital bed but returned it when she moved. She did have to go back to the ED yesterday because she ran out of oxygen at home. Currently she is here with a loaned oxygen tank from the hospital, and it has run out here in the clinic.  She does note increased swelling/tightness in her RLE and around her abdomen lately. Was referred to pulmonology for urgent consult but that appointment was scheduled for over a month from now.  OBJECTIVE:   BP 140/80   Pulse 92   SpO2 91%  Gen: appears tired but not in acute distress HEENT: normocephalic, atraumati Heart: regular rate and rhythm. No murmur Lungs: expiratory upper airway wheeze (throat) audible but no lower resp wheezes. Some inspiratory  crackles appreciated bilaterally in bases. Exam challenging due to habitus. Does have some slightly increased work of breathing but can speak in sentences. Neuro: alert, speech normal. Grossly nonfocal. Ext: s/p L BKA, wearing prosthesis. R shin with 2-3+ pitting edema.  ASSESSMENT/PLAN:   Acute hypoxic respiratory failure  This is exceedingly far from her baseline respiratory status. I have been [name]'s PCP for 7 years and saw her last a month ago; she was not having respiratory issues at that time. At baseline has well-controlled mild intermittent asthma. While obesity hypoventilation syndrome may be contributing, I struggle to blame this relatively rapid decompensation and significant new oxygen requirement on a chronic process like OHS without at least having pulmonology weigh in first. Differential diagnosis also includes asthma exacerbation, new onset interstitial lung disease, volume overload, undiagnosed PE (CTA was poor quality study due to artifact).   She is not doing well at home after a brief admission to the hospital two days ago. I think she needs pulmonology evaluation sooner than a month from now, and a trial of diuresis and steroids before this is blamed entirely on OHS. Reviewed with her the possibility of attempting to manage this outpatient, vs readmission for further evaluation and workup. She prefers inpatient admission.   No hospital beds are currently available but she is on the wait list for a bed. She is stable for outpatient management today until a bed can open up. Given 40mg  IM lasix today  to see if this produces any benefit.  Recommendations on admission include: - consider additional diuresis (due to habitus unclear how much was given intramuscularly, would not consider it lasix failure if she does not have increased urine output from today's dose) - recommend trial of steroids (prednisone vs solumedrol) in case asthma or other bronchospastic/inflammatory process  contributing - consult pulmonology for recommendations - RLE doppler ultrasound as surrogate marker for PE since CT scan was such poor quality - ABG to evaluate resp status further (not done during prior admission)  Prior to discharge, we will also likely need to arrange a home hospital bed and proper CPAP therapy that can accommodate simultaneous O2 administration at home.  Case discussed with inpatient attending Dr. Gwendlyn Deutscher and inpatient resident Dr. Higinio Plan.  Also of note - spoke with patient about PHQ-9 score with a positive to question #9. Patient states this was an inadvertent answer and denies any thoughts of harming herself or others.  [place]. [name], [place] than 45 minutes were spent on this encounter on the day of service, including pre-visit planning, actual face to face time, coordination of care, and documentation of visit.

## 2020-06-17 NOTE — Progress Notes (Signed)
VAST consulted to obtain IV access. Arrived at pt's bedside and assessed superficial vessels of bilateral arms. Pt reports her veins roll.  At this time, no IV meds or studies are ordered. Unit RN has paged physician for written orders.  Advised that we want to place the correct line for the patient for vessel preservation as well as reduce infection risk from multiple sticks. Until we are aware of what patient needs we will hold off on IV placement. RN verbalized understanding.

## 2020-06-17 NOTE — Progress Notes (Signed)
Patient given PHQ2 and 9.  Provider made aware.  [name], [place]

## 2020-06-17 NOTE — Patient Instructions (Signed)
The hospital will call you when a bed is available. It may be later tonight.  If you worsen today, go to the ER.  Be well, Dr. [name]

## 2020-06-18 ENCOUNTER — Ambulatory Visit: Payer: Medicaid Other | Admitting: Physical Therapy

## 2020-06-18 ENCOUNTER — Telehealth: Payer: Self-pay | Admitting: *Deleted

## 2020-06-18 ENCOUNTER — Encounter (HOSPITAL_COMMUNITY): Payer: Self-pay | Admitting: Family Medicine

## 2020-06-18 ENCOUNTER — Observation Stay (HOSPITAL_COMMUNITY): Payer: Medicaid Other

## 2020-06-18 DIAGNOSIS — M7989 Other specified soft tissue disorders: Secondary | ICD-10-CM

## 2020-06-18 DIAGNOSIS — I10 Essential (primary) hypertension: Secondary | ICD-10-CM | POA: Diagnosis not present

## 2020-06-18 DIAGNOSIS — Z7951 Long term (current) use of inhaled steroids: Secondary | ICD-10-CM | POA: Diagnosis not present

## 2020-06-18 DIAGNOSIS — E1165 Type 2 diabetes mellitus with hyperglycemia: Secondary | ICD-10-CM | POA: Diagnosis not present

## 2020-06-18 DIAGNOSIS — E1140 Type 2 diabetes mellitus with diabetic neuropathy, unspecified: Secondary | ICD-10-CM | POA: Diagnosis not present

## 2020-06-18 DIAGNOSIS — E662 Morbid (severe) obesity with alveolar hypoventilation: Secondary | ICD-10-CM | POA: Diagnosis not present

## 2020-06-18 DIAGNOSIS — R0902 Hypoxemia: Secondary | ICD-10-CM

## 2020-06-18 DIAGNOSIS — F3181 Bipolar II disorder: Secondary | ICD-10-CM | POA: Diagnosis not present

## 2020-06-18 DIAGNOSIS — Z794 Long term (current) use of insulin: Secondary | ICD-10-CM | POA: Diagnosis not present

## 2020-06-18 DIAGNOSIS — J9690 Respiratory failure, unspecified, unspecified whether with hypoxia or hypercapnia: Secondary | ICD-10-CM | POA: Diagnosis not present

## 2020-06-18 DIAGNOSIS — E872 Acidosis: Secondary | ICD-10-CM | POA: Diagnosis not present

## 2020-06-18 DIAGNOSIS — Z6841 Body Mass Index (BMI) 40.0 and over, adult: Secondary | ICD-10-CM | POA: Diagnosis not present

## 2020-06-18 DIAGNOSIS — Z87891 Personal history of nicotine dependence: Secondary | ICD-10-CM | POA: Diagnosis not present

## 2020-06-18 DIAGNOSIS — J9602 Acute respiratory failure with hypercapnia: Secondary | ICD-10-CM

## 2020-06-18 DIAGNOSIS — E785 Hyperlipidemia, unspecified: Secondary | ICD-10-CM | POA: Diagnosis not present

## 2020-06-18 DIAGNOSIS — Z79899 Other long term (current) drug therapy: Secondary | ICD-10-CM | POA: Diagnosis not present

## 2020-06-18 DIAGNOSIS — J9601 Acute respiratory failure with hypoxia: Secondary | ICD-10-CM

## 2020-06-18 DIAGNOSIS — J9811 Atelectasis: Secondary | ICD-10-CM | POA: Diagnosis not present

## 2020-06-18 DIAGNOSIS — J449 Chronic obstructive pulmonary disease, unspecified: Secondary | ICD-10-CM | POA: Diagnosis not present

## 2020-06-18 DIAGNOSIS — B974 Respiratory syncytial virus as the cause of diseases classified elsewhere: Secondary | ICD-10-CM | POA: Diagnosis not present

## 2020-06-18 DIAGNOSIS — J9621 Acute and chronic respiratory failure with hypoxia: Secondary | ICD-10-CM | POA: Diagnosis not present

## 2020-06-18 DIAGNOSIS — G894 Chronic pain syndrome: Secondary | ICD-10-CM | POA: Diagnosis not present

## 2020-06-18 DIAGNOSIS — Z79891 Long term (current) use of opiate analgesic: Secondary | ICD-10-CM | POA: Diagnosis not present

## 2020-06-18 DIAGNOSIS — J9622 Acute and chronic respiratory failure with hypercapnia: Secondary | ICD-10-CM | POA: Diagnosis not present

## 2020-06-18 DIAGNOSIS — Z89512 Acquired absence of left leg below knee: Secondary | ICD-10-CM | POA: Diagnosis not present

## 2020-06-18 DIAGNOSIS — G2581 Restless legs syndrome: Secondary | ICD-10-CM | POA: Diagnosis not present

## 2020-06-18 DIAGNOSIS — I272 Pulmonary hypertension, unspecified: Secondary | ICD-10-CM | POA: Diagnosis not present

## 2020-06-18 DIAGNOSIS — E1142 Type 2 diabetes mellitus with diabetic polyneuropathy: Secondary | ICD-10-CM | POA: Diagnosis not present

## 2020-06-18 LAB — RESPIRATORY PANEL BY PCR

## 2020-06-18 LAB — GLUCOSE, CAPILLARY
Glucose-Capillary: 398 mg/dL — ABNORMAL HIGH (ref 70–99)
Glucose-Capillary: 378 mg/dL — ABNORMAL HIGH (ref 70–99)
Glucose-Capillary: 288 mg/dL — ABNORMAL HIGH (ref 70–99)
Glucose-Capillary: 248 mg/dL — ABNORMAL HIGH (ref 70–99)
Glucose-Capillary: 180 mg/dL — ABNORMAL HIGH (ref 70–99)

## 2020-06-18 LAB — CBC
HCT: 35.2 % — ABNORMAL LOW (ref 36.0–46.0)
Hemoglobin: 10.6 g/dL — ABNORMAL LOW (ref 12.0–15.0)
MCH: 28.3 pg (ref 26.0–34.0)
MCHC: 30.1 g/dL (ref 30.0–36.0)
MCV: 94.1 fL (ref 80.0–100.0)
Platelets: 307 10*3/uL (ref 150–400)
RBC: 3.74 MIL/uL — ABNORMAL LOW (ref 3.87–5.11)
RDW: 13.2 % (ref 11.5–15.5)
WBC: 7.2 10*3/uL (ref 4.0–10.5)
nRBC: 0 % (ref 0.0–0.2)

## 2020-06-18 LAB — BASIC METABOLIC PANEL
Anion gap: 13 (ref 5–15)
BUN: 28 mg/dL — ABNORMAL HIGH (ref 6–20)
CO2: 22 mmol/L (ref 22–32)
Calcium: 8.6 mg/dL — ABNORMAL LOW (ref 8.9–10.3)
Chloride: 99 mmol/L (ref 98–111)
Creatinine, Ser: 0.89 mg/dL (ref 0.44–1.00)
GFR calc Af Amer: >60 mL/min (ref >60)
GFR calc non Af Amer: >60 mL/min (ref >60)
Glucose, Bld: 372 mg/dL — ABNORMAL HIGH (ref 70–99)
Potassium: 4.5 mmol/L (ref 3.5–5.1)
Sodium: 134 mmol/L — ABNORMAL LOW (ref 135–145)

## 2020-06-18 MED ORDER — INSULIN ASPART PROT & ASPART (70-30 MIX) 100 UNIT/ML ~~LOC~~ SUSP
80.0000 [IU] | Freq: Every day | SUBCUTANEOUS | Status: DC
  Administered 2020-06-18: 80 [IU] via SUBCUTANEOUS
  Filled 2020-06-18: qty 10

## 2020-06-18 MED ORDER — FLUTICASONE PROPIONATE 50 MCG/ACT NA SUSP
1.0000 | Freq: Every day | NASAL | Status: DC
  Administered 2020-06-19 – 2020-06-22 (×4): 1 via NASAL
  Filled 2020-06-18: qty 16

## 2020-06-18 MED ORDER — INSULIN ASPART PROT & ASPART (70-30 MIX) 100 UNIT/ML ~~LOC~~ SUSP
80.0000 [IU] | Freq: Every day | SUBCUTANEOUS | Status: DC
  Administered 2020-06-18 – 2020-06-19 (×2): 80 [IU] via SUBCUTANEOUS

## 2020-06-18 MED ORDER — INSULIN ASPART 100 UNIT/ML ~~LOC~~ SOLN
10.0000 [IU] | Freq: Once | SUBCUTANEOUS | Status: AC
  Administered 2020-06-18: 10 [IU] via SUBCUTANEOUS

## 2020-06-18 MED ORDER — INSULIN ASPART 100 UNIT/ML ~~LOC~~ SOLN
0.0000 [IU] | Freq: Three times a day (TID) | SUBCUTANEOUS | Status: DC
  Administered 2020-06-18 – 2020-06-19 (×2): 7 [IU] via SUBCUTANEOUS
  Administered 2020-06-19: 15 [IU] via SUBCUTANEOUS
  Administered 2020-06-19: 22 [IU] via SUBCUTANEOUS
  Administered 2020-06-20: 3 [IU] via SUBCUTANEOUS
  Administered 2020-06-20: 7 [IU] via SUBCUTANEOUS
  Administered 2020-06-20 – 2020-06-21 (×2): 4 [IU] via SUBCUTANEOUS
  Administered 2020-06-21: 7 [IU] via SUBCUTANEOUS
  Administered 2020-06-22 (×2): 15 [IU] via SUBCUTANEOUS
  Administered 2020-06-22: 20 [IU] via SUBCUTANEOUS
  Administered 2020-06-23: 4 [IU] via SUBCUTANEOUS
  Administered 2020-06-23: 7 [IU] via SUBCUTANEOUS

## 2020-06-18 MED ORDER — SALINE SPRAY 0.65 % NA SOLN
1.0000 | NASAL | Status: DC | PRN
  Administered 2020-06-20: 1 via NASAL
  Filled 2020-06-18 (×2): qty 44

## 2020-06-18 MED ORDER — INSULIN ASPART PROT & ASPART (70-30 MIX) 100 UNIT/ML ~~LOC~~ SUSP
100.0000 [IU] | Freq: Every day | SUBCUTANEOUS | Status: DC
  Administered 2020-06-19 – 2020-06-20 (×2): 100 [IU] via SUBCUTANEOUS
  Filled 2020-06-18: qty 10

## 2020-06-18 MED ORDER — IPRATROPIUM-ALBUTEROL 0.5-2.5 (3) MG/3ML IN SOLN
3.0000 mL | Freq: Three times a day (TID) | RESPIRATORY_TRACT | Status: DC
  Administered 2020-06-18 – 2020-06-23 (×16): 3 mL via RESPIRATORY_TRACT
  Filled 2020-06-18 (×16): qty 3

## 2020-06-18 MED ORDER — INSULIN ASPART PROT & ASPART (70-30 MIX) 100 UNIT/ML ~~LOC~~ SUSP: 60.0000 [IU] | Freq: Every day | SUBCUTANEOUS | Status: DC

## 2020-06-18 NOTE — Telephone Encounter (Signed)
Unable to complete transition of care assessment; pt admitted to hospital 06/17/20; will place order for case management

## 2020-06-18 NOTE — Progress Notes (Signed)
Lower extremity venous has been completed.   Preliminary results in CV Proc.   Abram Sander 06/18/2020 12:53 PM

## 2020-06-18 NOTE — Progress Notes (Signed)
Placed pt on BiPAP per pt request at 1750. Pt states she feels comfortable at this time. RT will continue to monitor.

## 2020-06-18 NOTE — Consult Note (Signed)
NAME:  [name], MRN:  [id number], DOB:  October 24, 1976, LOS: 1 ADMISSION DATE:  06/17/2020, CONSULTATION DATE:  7/14 REFERRING MD: Dr. Joya Gaskins, CHIEF COMPLAINT:  SOB   Brief History   43 y/o F with poorly controlled DM, chronic pain on opiates, BMI 61 and OSA on CPAP who was admitted 7/13   History of present illness   44 y/o F who presented to [facility] on 7/12 with reports of increased shortness of breath.   The patient was recently admitted from 7/9-7/11 and was discharged home on 2L oxygen.  She ran out of oxygen after returning home and noted increasing shortness of breath after which prompted her evaluation on the 7/12.  She has poorly controlled DM, OSA on CPAP, chronic pain on opiates and BMI of 61.  She notes increased nasal drainage, post nasal drip since starting oxygen. Additionally, she has seen occasional blood when she blows her nose.  She denies fevers, chills, n/v, diarrhea.  States she has noted increased LE swelling.  She is unable to lie flat due to dyspnea and the feeling that something heavy is lying on her chest.  She reports she wears her CPAP faithfully as she can not lie down without it.  They were unable to bleed in the oxygen to her home CPAP.  She reports that she was discharged on 2L but is requiring 4L here.  She notes her concentrator at home will "kick the O2 down a notch randomly" (ie: reduce the liter flow) and her tank re-charger is not working so she can not re-fill her portable tanks.   ABG evaluation 7/13 7.30 / 59 / 80 / 28.6.  Note CO2 on BMP runs around 27-28 at baseline.  BNP 34.  Hgb 11. No CXR evaluation this admission.   PCCM consulted for evaluation.   Past Medical History  OSA on CPAP - 04/25/20 sleep study with AHI 16.8, moderate obstructive sleep apnea, no central sleep apena COPD  Asthma  SVT HTN  HLD  Depression Morbid Obesity  GERD DM II  RLS  L BKA  Peripheral Neuropathy  Drug Seeking Behavior Bipolar Disorder / Depression  Anemia    Significant Hospital Events   7/13 Admit  7/14 PCCM consulted   Consults:    Procedures:    Significant Diagnostic Tests:  CTA Chest 7/10 >> no central obstructing pulmonary embolism identified, significant limitations in imaging due to artifact, probable mild edema vs air trapping bilaterally, no evidence of pneumonia, heart size within normal limits, no pericardial effusion ECHO 7/11 >> poor windows, LVEF estimated 55-60%, no RWMA, RV systolic function not well visualized  Micro Data:  COVID 7/10 >> negative   Antimicrobials:     Interim history/subjective:  As above.   Objective   Blood pressure (!) 144/76, pulse 93, temperature 97.8 F (36.6 C), temperature source Oral, resp. rate 17, SpO2 98 %.        Intake/Output Summary (Last 24 hours) at 06/18/2020 0933 Last data filed at 06/18/2020 0530 Gross per 24 hour  Intake --  Output 750 ml  Net -750 ml   There were no vitals filed for this visit.  Examination: General: adult female sitting up in bed in NAD HEENT: MM pink/moist, [place] O2, fair dentition, unable to assess for JVD due to body habitus  Neuro: AAOx4, speech clear, MAE  CV: s1s2 RRR, no m/r/g PULM: non-labored on  4L, lungs bilaterally distant but clear  GI: soft, bsx4 active  Extremities: warm/dry, 2+ pitting  pre-tibial RLE edema, toe amputation on right, left BKA  Skin: no rashes or lesions  Resolved Hospital Problem list     Assessment & Plan:   Dyspnea Acute on Chronic Hypercarbic Respiratory Failure Alveolar Hypoventilation  Suspected Pulmonary Hypertension  Suspect her shortness of breath is multifactorial in the setting of morbid obesity with resultant alveolar hypoventilation, OSA and hypoxemia.  ABG on admit showed acute hypercarbia.  Recent CTA chest negative.  ECHO with poor windows but normal LV function. Suspect she likely has a degree of diastolic dysfunction.  Note size of pulmonary arteries on CTA chest, suspect she has pulmonary  hypertension due to chronic hypoxia, OSA.  No history of underlying autoimmune disease. She smoked briefly for 6 months in her 20's and was exposed to second hand smoke. Does have air trapping vs edema on CT but BNP relatively low.   -add nasal hygiene > flonase + nasal saline for post nasal gtt -assess CXR now  -assess RVP to ensure no viral illness, recent COVID negative  -consider lasix 7/15 after follow up BMP, note -741ml from lasix 40 mg 7/13 -ensure O2 set up at home prior to discharge  -repeat O2 assessment prior to discharge with exertion  -continue Dulera, duoneb -?role of prednisone, consider short course vs stopping all together   -she has a follow up appt 8/16 with Dr. Loanne Drilling from prior visit  Moderate Obstructive Sleep ApneaOSA  AHI 16.8 on 04/2020 sleep study.  -continue QHS CPAP -will need to ask CM to work on getting machine that will allow bleed in of O2  -limit pain, sedating medications as can worsen hypoventilation   Best practice:  Diet: Carb Modified Pain/Anxiety/Delirium protocol (if indicated): n/a VAP protocol (if indicated): n/a  DVT prophylaxis: lovenox  GI prophylaxis: PPI  Glucose control: SSI Mobility: As tolerated  Code Status: Full Code  Family Communication: Patient updated on plan of care Disposition: per primary   Labs   CBC: Recent Labs  Lab 06/12/20 0555 06/14/20 0049 06/15/20 0636 06/17/20 1730  WBC 11.4* 13.4* 12.4* 8.1  NEUTROABS 6.5 9.8*  --   --   HGB 10.9* 11.4* 11.0* 11.1*  HCT 35.7* 36.9 36.0 36.5  MCV 94.9 94.1 95.5 94.6  PLT 345 359 352 751    Basic Metabolic Panel: Recent Labs  Lab 06/12/20 0555 06/14/20 0049 06/15/20 0636 06/17/20 1730  NA 136 137 136 137  K 4.5 3.8 5.1 3.9  CL 102 100 99 99  CO2 24 24 26 27   GLUCOSE 401* 353* 261* 240*  BUN 25* 29* 49* 26*  CREATININE 0.91 1.19* 1.13* 0.97  CALCIUM 8.5* 8.7* 9.2 8.7*   GFR: Estimated Creatinine Clearance: 108.2 mL/min (by C-G formula based on SCr of 0.97  mg/dL). Recent Labs  Lab 06/12/20 0555 06/14/20 0049 06/15/20 0636 06/17/20 1730  WBC 11.4* 13.4* 12.4* 8.1    Liver Function Tests: Recent Labs  Lab 06/14/20 0049 06/17/20 1730  AST 12* 9*  ALT 16 15  ALKPHOS 98 91  BILITOT 0.3 0.3  PROT 8.1 6.9  ALBUMIN 3.4* 2.8*   No results for input(s): LIPASE, AMYLASE in the last 168 hours. No results for input(s): AMMONIA in the last 168 hours.  ABG    Component Value Date/Time   PHART 7.306 (L) 06/17/2020 1732   PCO2ART 59.2 (H) 06/17/2020 1732   PO2ART 80.9 (L) 06/17/2020 1732   HCO3 28.6 (H) 06/17/2020 1732   TCO2 22 12/27/2014 0823   ACIDBASEDEF 0.4 05/08/2019 2030  O2SAT 95.3 06/17/2020 1732     Coagulation Profile: No results for input(s): INR, PROTIME in the last 168 hours.  Cardiac Enzymes: No results for input(s): CKTOTAL, CKMB, CKMBINDEX, TROPONINI in the last 168 hours.  HbA1C: HbA1c POC (<> result, manual entry)  Date/Time Value Ref Range Status  04/03/2020 10:25 AM >15.0 4.0 - 5.6 % Final   Hgb A1c MFr Bld  Date/Time Value Ref Range Status  06/14/2020 07:22 AM 12.7 (H) 4.8 - 5.6 % Final    Comment:    (NOTE) Pre diabetes:          5.7%-6.4%  Diabetes:              >6.4%  Glycemic control for   <7.0% adults with diabetes   05/17/2019 06:40 AM 17.5 (H) 4.8 - 5.6 % Final    Comment:    (NOTE) Pre diabetes:          5.7%-6.4% Diabetes:              >6.4% Glycemic control for   <7.0% adults with diabetes     CBG: Recent Labs  Lab 06/15/20 0731 06/15/20 1146 06/15/20 1707 06/17/20 2056 06/18/20 0832  GLUCAP 238* 240* 229* 307* 398*    Review of Systems: Positives in [place]  Gen: Denies fever, chills, weight change, fatigue, night sweats HEENT: Denies blurred vision, double vision, hearing loss, tinnitus, sinus congestion, rhinorrhea, sore throat, neck stiffness, dysphagia PULM: Denies shortness of breath, cough, sputum production, hemoptysis, wheezing CV: Denies chest pain, edema,  orthopnea, paroxysmal nocturnal dyspnea, palpitations GI: Denies abdominal pain, nausea, vomiting, diarrhea, hematochezia, melena, constipation, change in bowel habits GU: Denies dysuria, hematuria, polyuria, oliguria, urethral discharge Endocrine: Denies hot or cold intolerance, polyuria, polyphagia or appetite change Derm: Denies rash, dry skin, scaling or peeling skin change Heme: Denies easy bruising, bleeding, bleeding gums Neuro: Denies headache, numbness, weakness, slurred speech, loss of memory or consciousness   Past Medical History  She,  has a past medical history of Acute osteomyelitis of ankle or foot, left ([name]) (01/31/2018), Alveolar hypoventilation, Anemia, Asthma, Bipolar 2 disorder ([name]), Carpal tunnel syndrome on right, Cellulitis (08/2010-08/2011), Chronic pain, COPD (chronic obstructive pulmonary disease) ([place]), Costochondritis, Depression, Diabetes mellitus type II, uncontrolled ([place]) (2000), Dizziness, Drug-seeking behavior, GERD (gastroesophageal reflux disease), Headache, HLD (hyperlipidemia), Hypertension, Morbid obesity ([name]), Muscle spasm, Nocturia, OSA on CPAP, Peripheral neuropathy, Pneumonia, Rectal fissure, Restless leg, SVT (supraventricular tachycardia) ([place]), Syncope (02/25/2016), Urinary frequency, and Varicose veins.   Surgical History    Past Surgical History:  Procedure Laterality Date   AMPUTATION Left 02/01/2018   Procedure: LEFT FOURTH AND 5TH TOE RAY AMPUTATION;  Surgeon: [name], MD;  Location: [place];  Service: Orthopedics;  Laterality: Left;   AMPUTATION Left 03/03/2018   Procedure: LEFT BELOW KNEE AMPUTATION;  Surgeon: [name], MD;  Location: [place];  Service: Orthopedics;  Laterality: Left;   CARPAL TUNNEL RELEASE Bilateral    CESAREAN SECTION  2007   INCISION AND DRAINAGE PERIRECTAL ABSCESS Left 05/18/2019   Procedure: IRRIGATION AND DEBRIDEMENT OF PANNIS ABSCESS, POSSIBLE DEBRIDEMENT OF BUTTOCK WOUND;  Surgeon: Donnie Mesa, MD;   Location: [place];  Service: General;  Laterality: Left;   IRRIGATION AND DEBRIDEMENT BUTTOCKS Left 05/17/2019   Procedure: IRRIGATION AND DEBRIDEMENT BUTTOCKS;  Surgeon: Donnie Mesa, MD;  Location: [place];  Service: General;  Laterality: Left;   KNEE ARTHROSCOPY Right 07/17/2010   LEFT HEART CATHETERIZATION WITH CORONARY ANGIOGRAM N/A 07/27/2012   Procedure: LEFT HEART  CATHETERIZATION WITH CORONARY ANGIOGRAM;  Surgeon: Sherren Mocha, MD;  Location: [facility] CATH LAB;  Service: Cardiovascular;  Laterality: N/A;   MASS EXCISION N/A 06/29/2013   Procedure:  WIDE LOCAL EXCISION OF POSTERIOR NECK ABSCESS;  Surgeon: Ralene Ok, MD;  Location: [place];  Service: General;  Laterality: N/A;   REPAIR KNEE LIGAMENT Left    "fixed ligaments and chipped patella"   right transmetatarsal amputation        Social History   reports that she quit smoking about 26 years ago. Her smoking use included cigarettes. She has a 0.09 pack-year smoking history. She has never used smokeless tobacco. She reports current drug use. She reports that she does not drink alcohol.   Family History   Her family history includes Allergic rhinitis in her mother; Anxiety disorder in her sister; Cancer in her maternal grandmother; Depression in her mother; Diabetes in her maternal grandmother and mother; GER disease in her mother; Heart attack in her father, paternal grandfather, paternal grandmother, and paternal uncle; Heart disease in her paternal grandfather and paternal grandmother; Hyperlipidemia in her maternal grandmother and mother; Hypertension in her maternal grandmother; Migraines in her sister; Other in her maternal grandfather; Restless legs syndrome in her mother.   Allergies Allergies  Allergen Reactions   Cefepime Other (See Comments)    AKI, see records from [place] hospitalization in January 2020.     Kiwi Extract Shortness Of Breath and Swelling   Morphine And Related Nausea And Vomiting   Trental  [Pentoxifylline] Nausea And Vomiting   Nubain [Nalbuphine Hcl] Other (See Comments)    "FEELS LIKE SOMETHING CRAWLING ON ME"     Home Medications  Prior to Admission medications   Medication Sig Start Date End Date Taking? Authorizing Provider  Accu-Chek Softclix Lancets lancets Use to check blood glucose four times daily 04/17/20  Yes Martyn Malay, MD  amLODipine (NORVASC) 10 MG tablet Take 1 tablet (10 mg total) by mouth daily. 05/25/19  Yes [name], MD  atorvastatin (LIPITOR) 40 MG tablet Take 1 tablet (40 mg total) by mouth daily. 04/11/20  Yes [name], MD  budesonide-formoterol [facility]) 160-4.5 MCG/ACT inhaler Inhale 2 puffs into the lungs 2 (two) times daily. 05/09/20  Yes [name], MD  Cholecalciferol 1000 units tablet Take 1 tablet (1,000 Units total) by mouth daily. 04/05/17  Yes [name], MD  Continuous Blood Gluc Sensor (DEXCOM G6 SENSOR) MISC Inject 1 applicator into the skin as directed. Change sensor every 10 days. 04/10/20  Yes Leavy Cella, RPH-CPP  Continuous Blood Gluc Transmit (DEXCOM G6 TRANSMITTER) MISC Inject 1 Device into the skin as directed. Reuse 8 times with sensor changes. 04/10/20  Yes Leavy Cella, RPH-CPP  cyclobenzaprine (FLEXERIL) 5 MG tablet TAKE 1 TABLET BY MOUTH 3 TIMES DAILY AS NEEDED FOR MUSCLE SPASMS Patient taking differently: Take 5 mg by mouth 3 (three) times daily as needed for muscle spasms. TAKE 1 TABLET BY MOUTH 3 TIMES DAILY AS NEEDED FOR MUSCLE SPASMS 04/11/20  Yes [name], MD  gabapentin (NEURONTIN) 600 MG tablet TAKE 2 TABLETS BY MOUTH 3 TIMES DAILY Patient taking differently: Take 600 mg by mouth 3 (three) times daily.  05/09/20  Yes [name], MD  glucose blood (ACCU-CHEK GUIDE) test strip USE T0 CHECK BLOOD SUGAR 3 TIMES DAILY 04/18/20  Yes Leavy Cella, RPH-CPP  HYDROcodone-acetaminophen ([place]) 10-325 MG tablet TAKE 1 TABLET BY MOUTH EVERY SIX TO EIGHT HOURS AS NEEDED Patient  taking differently: 1 tablet every 6 (six) hours as needed for moderate pain. TAKE 1 TABLET BY MOUTH EVERY SIX TO EIGHT HOURS AS NEEDED 06/11/20  Yes Chambliss, Jeb Levering, MD  insulin isophane & regular human (HUMULIN 70/30 MIX) (70-30) 100 UNIT/ML KwikPen Inject 100 Units into the skin daily with breakfast AND 80 Units daily with supper. 06/12/20  Yes Leavy Cella, RPH-CPP  Insulin Pen Needle (SURE COMFORT PEN NEEDLES) 32G X 4 MM MISC Use to inject insulin as indicated 04/17/20  Yes Martyn Malay, MD  Lancets (ACCU-CHEK MULTICLIX) lancets 1 each by Other route as needed for other. Use as instructed 04/18/20  Yes Leavy Cella, RPH-CPP  metoprolol succinate (TOPROL XL) 25 MG 24 hr tablet Take 1 tablet (25 mg total) by mouth daily. 05/28/20 05/23/21 Yes Sherren Mocha, MD  pantoprazole (PROTONIX) 40 MG tablet Take 1 tablet (40 mg total) by mouth daily. 03/16/18  Yes Angiulli, Lavon Paganini, PA-C  PROVENTIL HFA 108 (90 Base) MCG/ACT inhaler Inhale 1-2 puffs into the lungs every 6 (six) hours as needed for wheezing or shortness of breath. Patient taking differently: Inhale 2 puffs into the lungs every 4 (four) hours as needed for wheezing.  05/11/19  Yes [name], MD  rOPINIRole (REQUIP) 1 MG tablet Take 1 tablet (1 mg total) by mouth at bedtime. 04/03/20  Yes [name], MD  VICTOZA 18 MG/3ML SOPN inject 0.6 MG SUBCUTANEOUSLY ONCE A DAY FOR 1 WEEK, THEN INCREASE TO 1.2 MG ONCE daily max 1.8 MG Patient taking differently: Inject 1.2 mg into the skin daily.  04/16/20  Yes [name], MD     Critical care time: n/a     Noe Gens, MSN, NP-C [facility] 06/18/2020, 9:33 AM   Please see [email or web address] for pager details.

## 2020-06-18 NOTE — Progress Notes (Signed)
Family Medicine Teaching Service Daily Progress Note Intern Pager: [phone]  Patient name: [name] Medical record number: [id number] Date of birth: August 04, 1976 Age: 44 y.o. Gender: female  Primary Care Provider: [name], MD Consultants: Pulmonology Code Status: Full Code  Pt Overview and Major Events to Date:  Admitted 06/17/2020  Assessment and Plan: [name] is a 44 y.o. female presenting as a direct admission with dyspnea for acute hypoxic respiratory failure in the setting of 1 month history with worsening shortness of breath. PMH is significant for poorly controlled type 2 diabetes, depression, OSA, hypertension, morbid obesity, OHS and left BKA.   Acute hypoxic, hypercarbic respiratory failure: Worsening. Patient was admitted by her PCP who was last seen in the office on 05/13/2020. She has been to the ED multiple times for dyspnea and was admitted to [facility] on 06/14/2020 and discharged with 4L oxygen. Approximate one month history of sudden onset worsening dyspnea, now requiring 4L  at all times.  Recently admitted on 7/10, felt respiratory status may be 2/2 OHS after unremarkable echo, BNP, and CTA (technically poor study due to body habitus). She had mild expiratory wheezing and respiratory acidosis present on ABG, will treat possible asthma component.  Otherwise in the setting of previously well-controlled mild asthma, rarely using rescue inhaler. She reports her symptoms started after moving into a new apartment where she has been concerned for mold. Patient's acute hypoxic respiratory failure likely multifactorial. Differentials include interstitial lung disease due to environmental factors (mold), previous admission diagnosis of OSH and asthma exacerbation (given patient's history of asthma).   BNP within normal limits of 34. -Admitted to medical telemetry, attending Dr. [name] -Pulmonology consulted, recommendations include ensuring proper O2  assessment and set up prior to discharge, continue dulera and duoneb, consider short course or cessation of prednisone and CPAP. -R DVT U/S performed demonstrated no evidence of DVT and no cystic structure in the popliteal fossa, but exam was limited.  -IV Lasix 40 mg given this morning, reassess and would like to transition from IV to oral tomorrow if patient's symptoms have continued to improve -DuoNebs q4 -Prednisone 40 mg daily (will monitor glucose closely)  -Oxygen therapy as needed -order PFTs -Continue to monitor -Continue on 4L oxygen and would like to keep her at this amount and continue to monitor since her dyspnea her improved.  -Follow up with PCP   Atypical chest pain: Chronic, stable.  Denies chest pain today but admits to chest tightness and orthopnea. Intermittently present for the past several months, has already seen her cardiologist outpatient for this and had a low risk Lexiscan in 05/2020 suggesting against cardiac etiology.  Not currently present during evaluation.  Interestingly again started after moving into her new apartment, may be manifestation of above.  Awaiting updated EKG on admit, otherwise no ST changes during recent hospitalization.  Troponin 11-13 consistently on several checks through 05/12/2020.  -Awaiting EKG -Monitor for recurrence  Mild asthma: Chronic. Previously known to be well controlled, her current respiratory status for the past month is unusual for her. Worsening dyspnea started in December when she moved to her new place that is concerning for mold. Since December, dyspnea progressively worsening. Prior to December, patient only used an inhaler as needed which was a few times a year according to the patient. -Continue prednisone, duoneb and breathing treatments  -Continue home Symbicort, formulary Dulera on instead  T2DM: Chronic, uncontrolled. A1c 12.7 on 06/14/2020.  Following closely with her PCP and Dr. Valentina Lucks  within [facility] clinic to improve  this.  Currently takes Humulin 70/30 100U in am and 80U in pm, endorses compliance. CBG from 06/17/2020 is 307 and 398 today. -CBGs with meals and nightly -Novolog 10 units, Novolog mix 70/30 -resistant sliding scale added   Hypertension: Chronic, stable. BP today is 144/76 and 147/101 on arrival.  Takes Norvasc and metoprolol at home. -Continue home Norvasc and metoprolol -Continue to monitor BP  Hyperlipidemia: Chronic, stable. Most recent lipid panel from 04/10/2020 demonstrated cholesterol of 274, triglycerides 324, HDL 39 and LDL 173. Continue home Lipitor 40 mg. -Follow up with PCP at discharge  OSA: Chronic, stable. Wears CPAP at night, has not been able to do this due to new oxygen requirement of wearing nasal canula with machine at home and not being set up with it prior to previous discharge 2 days ago. -CPAP ordered, assess if can add O2 with it here  Morbid obesity: Chronic, stable. BMI 61.  Will qualify for weight reduction surgical intervention if desired. -Follow-up with PCP outpatient to discuss appropriate lifestyle modifications to incorporate after discharge  Chronic pain syndrome: Stable.  Denies any localized or generalized pain today. Takes gabapentin, Flexeril, and Norco as needed at home.  PMP aware reviewed and appropriate. -Continue home gabapentin and Norco 10-325 q6 PRN  -Continue home Flexeril  Restless leg syndrome: Chronic, stable. Takes ropinirole at bedtime. -Continue home med ropinirole   FEN/GI: Carb modified diet PPx: Lovenox  Disposition: Inpatient  Subjective:  Patient seen and examined at bedside. She appears in no acute distress but presents after being admitted by her PCP, Dr. [name]. She was last seen by Dr. [name] on June 8th and has been to the ED on multiple occasions due to dyspnea. Patient states that she recalls the significant change in symptoms beginning to first occur in December when she moved into her new apartment. She  endorses that she is concerned for mold in the house because since she has moved, patient admits to progressively worsening dyspnea that eventually got her admitted at [facility] on 06/14/2020 with the eventual diagnosis of obesity hypoventilation syndrome. She was sent home with 4L oxygen but states that her machine was not working. Patient tells me that at discharge 2 days ago, she was feeling well and then symptoms worsened when she returned home. She endorses worsening dyspnea and orthopnea at home that does not allow her to lay down to sleep at all. At home, she was also unable to eat due to her dyspnea. After being admitted, she states that she is feeling better but still endorses improved dyspnea. Patient was able sleep 6-7 hours last night and eat. Patient has undergone a significant change from only using her inhaler as needed which ended up being a few times a year, to her current state of worsening dyspnea and orthopnea on admission.  Today she denies chest pain but admits to mild chest tightness and dyspnea that has improved since admission enough to allow her to get sleep last night which she was previously unable to do at home. We discussed the possibility of her symptoms significantly worsening after going home due to environmental factors such as presence of mold.   Objective: Temp:  [97.8 F (36.6 C)-98.7 F (37.1 C)] 98.7 F (37.1 C) (07/13 2110) Pulse Rate:  [89-105] 94 (07/13 2305) Resp:  [20-22] 20 (07/13 2305) BP: (140-156)/(80-101) 156/92 (07/13 2110) SpO2:  [91 %-99 %] 95 % (07/13 2305) Physical Exam: General: Patient in no acute  distress, very pleasant Cardiovascular: regular rate and rhythm, no murmurs or gallops noted  Respiratory: lungs clear to auscultation bilaterally, mild increased work of breathing Abdomen: nontender on palpation, active bowel sounds  Extremities: distal pulse intact in right LE, 2+ pitting edema in right LE, left below knee amputation   Psych: mood appropriate, no agitation   Laboratory: Recent Labs  Lab 06/14/20 0049 06/15/20 0636 06/17/20 1730  WBC 13.4* 12.4* 8.1  HGB 11.4* 11.0* 11.1*  HCT 36.9 36.0 36.5  PLT 359 352 341   Recent Labs  Lab 06/14/20 0049 06/15/20 0636 06/17/20 1730  NA 137 136 137  K 3.8 5.1 3.9  CL 100 99 99  CO2 24 26 27   BUN 29* 49* 26*  CREATININE 1.19* 1.13* 0.97  CALCIUM 8.7* 9.2 8.7*  PROT 8.1  --  6.9  BILITOT 0.3  --  0.3  ALKPHOS 98  --  91  ALT 16  --  15  AST 12*  --  9*  GLUCOSE 353* 261* 240*      Imaging/Diagnostic Tests: VAS [place] LOWER EXTREMITY VENOUS (DVT)  Result Date: 06/18/2020  Lower Venous DVTStudy Indications: Swelling, and hypoxia, dyspnea.  Limitations: Body habitus and poor ultrasound/tissue interface. Comparison Study: 01/01/20 previous Performing Technologist: Abram Sander RVS  Examination Guidelines: A complete evaluation includes B-mode imaging, spectral Doppler, color Doppler, and power Doppler as needed of all accessible portions of each vessel. Bilateral testing is considered an integral part of a complete examination. Limited examinations for reoccurring indications may be performed as noted. The reflux portion of the exam is performed with the patient in reverse Trendelenburg.  +---------+---------------+---------+-----------+----------+------------------+ RIGHT    CompressibilityPhasicitySpontaneityPropertiesThrombus Aging     +---------+---------------+---------+-----------+----------+------------------+ CFV                                                   Not visualized     +---------+---------------+---------+-----------+----------+------------------+ SFJ                                                   Not visualized     +---------+---------------+---------+-----------+----------+------------------+ FV Prox                                               Not visualized      +---------+---------------+---------+-----------+----------+------------------+ FV Mid                                                Not visualized     +---------+---------------+---------+-----------+----------+------------------+ FV Distal               Yes      Yes                                     +---------+---------------+---------+-----------+----------+------------------+ PFV  Not visualized     +---------+---------------+---------+-----------+----------+------------------+ POP                     Yes      Yes                                     +---------+---------------+---------+-----------+----------+------------------+ PTV                                                   limited                                                                  visualization      +---------+---------------+---------+-----------+----------+------------------+ PERO                                                  Not visualized     +---------+---------------+---------+-----------+----------+------------------+   +----+---------------+---------+-----------+----------+--------------+ LEFTCompressibilityPhasicitySpontaneityPropertiesThrombus Aging +----+---------------+---------+-----------+----------+--------------+ CFV                                              Not visualized +----+---------------+---------+-----------+----------+--------------+     Summary: RIGHT: - There is no evidence of deep vein thrombosis in the lower extremity. However, portions of this examination were limited- see technologist comments above.  - No cystic structure found in the popliteal fossa.   *See table(s) above for measurements and observations.    Preliminary     Donney Dice, DO 06/18/2020, 6:02 AM PGY-1, [place] Intern pager: [phone], text pages welcome

## 2020-06-18 NOTE — Progress Notes (Signed)
Placed pt on bipap earlier, at 2324 found pt off bipap- placed pt back on bipap.  At 2345, RN called d/t pt took bipap mask off and pt was "trying to catch her breath".  BBSH w/ fine crackles noted, RN notified.  I talked to pt re: importance of bipap, pt placed back on bipap.  RN and NT at bedside to clean pt up.

## 2020-06-18 NOTE — Evaluation (Signed)
Physical Therapy Evaluation Patient Details Name: [name] MRN: [id number] DOB: 02-Sep-1976 Today's Date: 06/18/2020   History of Present Illness  [name] is a 44 y.o. 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 Pt with recent d/c from [facility] for [facility] with increased O2 requirements, readmitted to [facility] on 7/12 for similar.  Clinical Impression   Pt presents with generalized weakness, impaired respiratory status during mobility demonstrating dyspnea on exertion and O2 desaturation to 85% on 4LO2, difficulty performing mobility tasks, and decreased activity tolerance. Pt to benefit from acute PT to address deficits. Pt ambulated room distance with RW and close guard for safety, pt limited by dyspnea, O2 desaturation, and fatigue. Pt has goals to progress mobility and return to OPPT when ready, PT recommending HHPT to return to baseline mobility post-acutely. PT to progress mobility as tolerated, and will continue to follow acutely.      Follow Up Recommendations Home health PT    Equipment Recommendations  None recommended by PT    Recommendations for Other Services       Precautions / Restrictions Precautions Precautions: Fall Restrictions Weight Bearing Restrictions: No      Mobility  Bed Mobility Overal bed mobility: Needs Assistance             General bed mobility comments: sitting EOB upon PT arrival to room, requests stay sitting EOB upon PT exit. Requires PT assist to don LLE prosthesis  Transfers Overall transfer level: Needs assistance Equipment used: Rolling walker (2 wheeled) Transfers: Sit to/from [facility] Sit to Stand: Min assist;Min guard Stand pivot transfers: Min guard       General transfer comment: min assist for  initial stand to steady transitioning to min guard when standing from [facility], verbal cuing for hand and foot placement when rising (not followed). close guard during stand pivot to [facility] for safety, increased time to perform.  Ambulation/Gait Ambulation/Gait assistance: Min guard Gait Distance (Feet): 20 Feet Assistive device: Rolling walker (2 wheeled) (bariatric) Gait Pattern/deviations: Step-through pattern;Decreased stride length;Trunk flexed;Wide base of support Gait velocity: decr   General Gait Details: min guard for safety, verbal cuing for upright posture and proximity to RW. Wide BOS secondary to body habitus. SpO2 85-92% on 4LO2 during mobility with DOE 3/4, PT encouraging breathing technique and rest to recover sats  Stairs            Wheelchair Mobility    Modified Rankin (Stroke Patients Only)       Balance Overall balance assessment: Needs assistance Sitting-balance support: No upper extremity supported;Feet supported Sitting balance-Leahy Scale: Good     Standing balance support: Bilateral upper extremity supported Standing balance-Leahy Scale: Fair                               Pertinent Vitals/Pain Pain Assessment: No/denies pain    Home Living Family/patient expects to be discharged to:: Private residence Living Arrangements: Children;Parent Available Help at Discharge: Family Type of Home: Apartment Home Access: Level entry     Home Layout: One level Home Equipment: Environmental consultant - 2 wheels;Wheelchair - manual      Prior Function Level of Independence: Needs assistance   Gait / Transfers Assistance Needed: Pt reports ambulating with RW, uses w/c as primary means  of mobility. Pt going to OPPT to work on gait tolerance and balance PTA  ADL's / Homemaking Assistance Needed: assist of mother and dtr, states her 87 y/o daughter helps with pericare and bathing.        Hand Dominance   Dominant Hand: Right    Extremity/Trunk Assessment    Upper Extremity Assessment Upper Extremity Assessment: Overall WFL for tasks assessed    Lower Extremity Assessment Lower Extremity Assessment: Generalized weakness LLE Deficits / Details: chronic L BKA, prosthesis in room       Communication   Communication: No difficulties  Cognition Arousal/Alertness: Awake/alert Behavior During Therapy: WFL for tasks assessed/performed Overall Cognitive Status: Within Functional Limits for tasks assessed                                        General Comments      Exercises     Assessment/Plan    PT Assessment Patient needs continued PT services  PT Problem List Decreased strength;Decreased balance;Decreased activity tolerance;Decreased mobility;Decreased knowledge of use of DME;Cardiopulmonary status limiting activity;Obesity       PT Treatment Interventions DME instruction;Gait training;Functional mobility training;Therapeutic activities;Therapeutic exercise;Balance training;Patient/family education    PT Goals (Current goals can be found in the Care Plan section)  Acute Rehab PT Goals Patient Stated Goal: increase activity tolerance PT Goal Formulation: With patient Time For Goal Achievement: 07/02/20 Potential to Achieve Goals: Good    Frequency Min 3X/week   Barriers to discharge        Co-evaluation               AM-PAC PT "6 Clicks" Mobility  Outcome Measure Help needed turning from your back to your side while in a flat bed without using bedrails?: A Little Help needed moving from lying on your back to sitting on the side of a flat bed without using bedrails?: A Little Help needed moving to and from a bed to a chair (including a wheelchair)?: A Little Help needed standing up from a chair using your arms (e.g., wheelchair or bedside chair)?: A Little Help needed to walk in hospital room?: A Little Help needed climbing 3-5 steps with a railing? : A Lot 6 Click Score: 17    End of Session  Equipment Utilized During Treatment: Oxygen Activity Tolerance: Patient tolerated treatment well;Patient limited by fatigue Patient left: in bed;with call bell/phone within reach;with bed alarm set;with nursing/sitter in room (sitting EOB with tray table in front) Nurse Communication: Mobility status PT Visit Diagnosis: Muscle weakness (generalized) (M62.81);Unsteadiness on feet (R26.81);Difficulty in walking, not elsewhere classified (R26.2)    Time: 4492-0100 PT Time Calculation (min) (ACUTE ONLY): 22 min   Charges:   PT Evaluation $PT Eval Low Complexity: 1 Low          Roshonda Sperl E, PT Acute Rehabilitation Services Pager [phone]  Office [phone]   Maleeyah Mccaughey D Elonda Husky 06/18/2020, 11:39 AM

## 2020-06-18 NOTE — Progress Notes (Signed)
The patient endorses SOB, especially with laying done for a few weeks. Had been to the hospital x 2 for this was d/ced home on 4 L O2. She denies chest pain, no cough.  Exam: Gen: Morbidly obese. HEENT: EOMI, PERRLA. Neuro: Grossly intact. Heart: S1 S2 normal, no murmur. RRR. Lungs: Air entry equal B/L. Difficult exam due to habitus. Abd: Obese, soft, NT/ND, BS+ and normal. Ext: right transmetatarsal amputation and left BKA  A/P: Acute hypoxic, hypercarbia respiratory failure: Not previously on O2. likely multifactorial: OHS, OSA, COPD/Asthma. ?? CHF: Normal BNP at 34. She is s/p one dose on Lasix 40 mg IM with good diuresis. However, she does not appear fluid overloaded. Hence we will hold for now and reassess her. Treat OSA with nightly CPAP. I agree with  Prednisone 40 x 5 days in addition to her [facility] She will benefit from an outpatient PFT eval. Pulm to see.   Poorly controlled DM 2. A1C of 12.7 Adjust insulin regimen as needed. Outpatient f/u with her PCP.  Her other chronic problems are stable. I will cosign resident's note once it is completed.

## 2020-06-19 DIAGNOSIS — J9602 Acute respiratory failure with hypercapnia: Secondary | ICD-10-CM | POA: Diagnosis not present

## 2020-06-19 DIAGNOSIS — J9601 Acute respiratory failure with hypoxia: Secondary | ICD-10-CM | POA: Diagnosis not present

## 2020-06-19 DIAGNOSIS — E1140 Type 2 diabetes mellitus with diabetic neuropathy, unspecified: Secondary | ICD-10-CM | POA: Diagnosis not present

## 2020-06-19 LAB — BASIC METABOLIC PANEL
Anion gap: 8 (ref 5–15)
BUN: 37 mg/dL — ABNORMAL HIGH (ref 6–20)
CO2: 29 mmol/L (ref 22–32)
Calcium: 8.6 mg/dL — ABNORMAL LOW (ref 8.9–10.3)
Chloride: 97 mmol/L — ABNORMAL LOW (ref 98–111)
Creatinine, Ser: 1.18 mg/dL — ABNORMAL HIGH (ref 0.44–1.00)
GFR calc Af Amer: >60 mL/min (ref >60)
GFR calc non Af Amer: 57 mL/min — ABNORMAL LOW (ref >60)
Glucose, Bld: 246 mg/dL — ABNORMAL HIGH (ref 70–99)
Potassium: 5 mmol/L (ref 3.5–5.1)
Sodium: 134 mmol/L — ABNORMAL LOW (ref 135–145)

## 2020-06-19 LAB — CBC
HCT: 34.9 % — ABNORMAL LOW (ref 36.0–46.0)
Hemoglobin: 10.4 g/dL — ABNORMAL LOW (ref 12.0–15.0)
MCH: 28.3 pg (ref 26.0–34.0)
MCHC: 29.8 g/dL — ABNORMAL LOW (ref 30.0–36.0)
MCV: 95.1 fL (ref 80.0–100.0)
Platelets: 314 10*3/uL (ref 150–400)
RBC: 3.67 MIL/uL — ABNORMAL LOW (ref 3.87–5.11)
RDW: 13.4 % (ref 11.5–15.5)
WBC: 8.6 10*3/uL (ref 4.0–10.5)
nRBC: 0 % (ref 0.0–0.2)

## 2020-06-19 LAB — GLUCOSE, CAPILLARY
Glucose-Capillary: 220 mg/dL — ABNORMAL HIGH (ref 70–99)
Glucose-Capillary: 345 mg/dL — ABNORMAL HIGH (ref 70–99)
Glucose-Capillary: 464 mg/dL — ABNORMAL HIGH (ref 70–99)
Glucose-Capillary: 445 mg/dL — ABNORMAL HIGH (ref 70–99)
Glucose-Capillary: 448 mg/dL — ABNORMAL HIGH (ref 70–99)
Glucose-Capillary: 364 mg/dL — ABNORMAL HIGH (ref 70–99)
Glucose-Capillary: 360 mg/dL — ABNORMAL HIGH (ref 70–99)

## 2020-06-19 MED ORDER — FUROSEMIDE 40 MG PO TABS
40.0000 mg | ORAL_TABLET | Freq: Every day | ORAL | Status: DC
  Administered 2020-06-19 – 2020-06-23 (×5): 40 mg via ORAL
  Filled 2020-06-19 (×5): qty 1

## 2020-06-19 MED ORDER — HYDROCODONE-ACETAMINOPHEN 10-325 MG PO TABS
1.0000 | ORAL_TABLET | Freq: Three times a day (TID) | ORAL | Status: DC | PRN
  Administered 2020-06-19 – 2020-06-23 (×9): 1 via ORAL
  Filled 2020-06-19 (×9): qty 1

## 2020-06-19 NOTE — Progress Notes (Signed)
Family Medicine Teaching Service Daily Progress Note Intern Pager: [phone]  Patient name: [name] Medical record number: [id number] Date of birth: 02-Apr-1976 Age: 44 y.o. Gender: female  Primary Care Provider: [name], MD Consultants: Pulmonology Code Status: Full Code  Pt Overview and Major Events to Date:  Admitted 06/17/2020  Assessment and Plan: Devyn Hall a 44 y.o.femalepresenting as a direct admission with dyspnea for acute hypoxic respiratory failure in the setting of 1 month history with worsening shortness of breath. PMH is significant forpoorly controlled type 2 diabetes, depression, OSA, hypertension, morbid obesity, OHS and left BKA.  Acute hypoxic, hypercarbic respiratory failure: Worsening. Patient was admitted by her PCP who was last seen in the office on 05/13/2020. She has been to the ED multiple times for dyspnea and was admitted to [facility] on 06/14/2020 and discharged with 4L oxygen. Approximateonemonth history of sudden onset worsening dyspnea,now requiring4L NCat all times. Recently admitted on 7/10,felt respiratory status may be 2/2 OHS after unremarkable echo,BNP, and CTA(technically poor study due to body habitus).She had mild expiratory wheezing and respiratory acidosis present on ABG, will treat possible asthma component. Otherwise in the setting of previously well-controlled mild asthma,rarely using rescue inhaler. She reports her symptoms started after moving intoa new apartment where she has been concerned for mold. Patient's acute hypoxic respiratory failure likely multifactorial. Differentials include interstitial lung disease due to environmental factors (mold), previous admission diagnosis of OSH and asthma exacerbation (given patient's history of asthma).  BNP within normal limits of 34. Chest x-ray demonstrated mild peribronchial thickening with increasing basilar atelectasis.  -Pulmonology consulted and on board -R  DVT U/S performed demonstrated no evidence of DVT and no cystic structure in the popliteal fossa, but exam was limited.  -oral Lasix 40 mg -DuoNebsq4 -Continue prednisone 40 mg daily(will monitor glucose closely) -Oxygen therapy as needed -Continue to monitor -Continue on 4L oxygen and would like to keep her at this amount and continue to monitor since her dyspnea her improved.  -Follow up with PCP and get PFTs outpatient -Working on getting patient appropriate oxygen therapy for home use. Spoke to the company regarding her oxygen machine to ensure proper setup after discharge -Ordered home health PT/OT for discharge   Atypical chest pain: Chronic, stable. Denies chest pain today but admits to chest tightness and orthopnea. Intermittently present for the past several months, has already seen her cardiologist outpatient for this and had a low risk Lexiscan in 05/2020 suggesting against cardiac etiology. Not currently present during evaluation. Interestingly again started after moving into her new apartment,may be manifestation of above. Troponin 11-13 consistently on several checks through 05/12/2020.  -Awaiting EKG -Monitor for recurrence  Mild asthma: Chronic. Previously known to be well controlled, her current respiratory status for the past month is unusual for her. Worsening dyspnea started in December when she moved to her new place that is concerning for mold. Since December, dyspnea progressively worsening. Prior to December, patient only used an inhaler as needed which was a few times a year according to the patient. -Continue prednisone, duoneb and breathing treatments  -Continue home Symbicort, formulary Dulera on instead  T2DM: Chronic, uncontrolled. A1c 12.7 on 06/14/2020. Following closely with her PCP and Dr. [facility] clinic to improve this. Currently takes Humulin 70/30 100U in am and 80U in pm,endorses compliance. CBG from 06/17/2020 is 307 and 398 today. -CBGs  with meals and nightly -Novolog 10 units, Novolog mix 70/30 -resistant sliding scale added   Hypertension: Chronic, stable. BP  today is 118/67 and 147/101 on arrival. Takes Norvasc and metoprolol at home. -Continue home Norvasc and metoprolol -Continue to monitor BP  Hyperlipidemia: Chronic, stable. Most recent lipid panel from 04/10/2020 demonstrated cholesterol of 274, triglycerides 324, HDL 39 and LDL 173. Continue home Lipitor 40 mg. -Follow up with PCP at discharge  OSA: Chronic, stable. Patient wearing BiPAP as recommended by pulmonology. Previously wore CPAP at night, has not been able to do this due to new oxygen requirement of wearing nasal canula with machine at home and not being set up with it prior to previous discharge 2 days ago. -Continue BiPAP  Morbid obesity: Chronic, stable. BMI 61. Will qualify for weight reduction surgical intervention if desired. -Follow-up with PCP outpatient to discuss appropriate lifestyle modifications to incorporate after discharge  Chronic pain syndrome:Stable. Denies any localized or generalized pain today. Takes gabapentin,Flexeril, and Norco as needed at home. PMP aware reviewed and appropriate. -Continue home gabapentin and Norco10-325 q6 PRN -Continue home Flexeril  Restless leg syndrome: Chronic, stable. Takes ropinirole at bedtime. -Continue home med ropinirole   FEN/GI: Carbmodified diet PPx: Lovenox  Disposition: Inpatient  Subjective:  Patient seen and examined at bedside. She sits comfortably in bed. Her daughter was present as well. Overnight events include patient removing her BiPAP for a short amount of time because she stated that she could not breathe. Patient used the placed the BiPAP back on shortly after and kept it on for the rest of the night. She admits to dyspnea but states that it has improved since I saw her yesterday. She denies chest pain, dizziness and pain. She denies any new symptoms today.    Objective: Temp:  [97.9 F (36.6 C)-99 F (37.2 C)] 99 F (37.2 C) (07/15 0817) Pulse Rate:  [87-105] 105 (07/15 0817) Resp:  [14-22] 14 (07/15 0817) BP: (118-159)/(67-100) 159/100 (07/15 0817) SpO2:  [92 %-100 %] 92 % (07/15 1441) FiO2 (%):  [40 %] 40 % (07/14 2014) Physical Exam: General: Patient in no acute distress, very pleasant  Cardiovascular: regular rate and rhythm, no murmurs or gallops noted  Respiratory: lungs clear to auscultation bilaterally, no increased work of breathing noted. Abdomen: nontender, active bowel sounds  Extremities: distal pulse intact on the right LE, 2+ pitting edema in right LE, left below knee amputation  Psych: mood appropriate, no agitation   Laboratory: Recent Labs  Lab 06/17/20 1730 06/18/20 1224 06/19/20 0314  WBC 8.1 7.2 8.6  HGB 11.1* 10.6* 10.4*  HCT 36.5 35.2* 34.9*  PLT 341 307 314   Recent Labs  Lab 06/14/20 0049 06/15/20 0636 06/17/20 1730 06/18/20 1224 06/19/20 0314  NA 137   < > 137 134* 134*  K 3.8   < > 3.9 4.5 5.0  CL 100   < > 99 99 97*  CO2 24   < > 27 22 29   BUN 29*   < > 26* 28* 37*  CREATININE 1.19*   < > 0.97 0.89 1.18*  CALCIUM 8.7*   < > 8.7* 8.6* 8.6*  PROT 8.1  --  6.9  --   --   BILITOT 0.3  --  0.3  --   --   ALKPHOS 98  --  91  --   --   ALT 16  --  15  --   --   AST 12*  --  9*  --   --   GLUCOSE 353*   < > 240* 372* 246*   < > =  values in this interval not displayed.      Imaging/Diagnostic Tests: No results found.    Donney Dice, DO 06/19/2020, 4:00 PM PGY-1, [place] Intern pager: [phone], text pages welcome

## 2020-06-19 NOTE — Progress Notes (Addendum)
Pt had critical Accucheck BS  464.  I notified the on call intern, Dr. Larae Grooms  [phone]. 70/30 Novolog 80 U was administered. Dr. Larae Grooms instructed me to recheck Accucheck in 30 minutes and administer Novolog 22U sliding scale depending on result.  Repeat Accucheck was 445. Administered Novolog 22 U. Rechecked Accucheck 30 minutes later. BS 448.  I notified Dr. Larae Grooms.  Instructed to recheck BS in 1 hour (1920).  Will continue to monitor

## 2020-06-19 NOTE — TOC Initial Note (Signed)
Transition of Care (TOC) - Initial/Assessment Note    Patient Details  Name: [name] MRN: [id number] Date of Birth: 08/31/1976  Transition of Care (TOC) CM/SW Contact:      , LCSWA Phone Number: 06/19/2020, 12:34 PM  Clinical Narrative:                  CSW met with pt at bedside. CSW introduced self and explained her role at the hospital. Pt states she lives at home with her mother and children. Pt states PTA she was on 2L of oxygen but the machine quit working. Pt states she has DME at home. Pt may need bipap.   CSW discussed PT orders of HHPT. Pt stated [facility] has used [facility] past. [facility] is unable to provide services at this time due to staffing. CSW will reach out to other HH agencies but pts insurance may be a barrier.   CSW arranged 02 through [employer] with [facility].   Pt stated her mother will be able to take her home at d/c. CSW will continue to follow.  Expected Discharge Plan: Home w Home Health Services Barriers to Discharge: No Barriers Identified   Patient Goals and CMS Choice   CMS Medicare.gov Compare Post Acute Care list provided to:: Patient Choice offered to / list presented to : Patient  Expected Discharge Plan and Services Expected Discharge Plan: Home w Home Health Services       Living arrangements for the past 2 months: Apartment                 DME Arranged: Oxygen DME Agency: [employer] Date DME Agency Contacted: 06/19/20 Time DME Agency Contacted: 1232 Representative spoke with at DME Agency: [facility]            Prior Living Arrangements/Services Living arrangements for the past 2 months: Apartment Lives with:: Parents, Minor Children   Do you feel safe going back to the place where you live?: Yes      Need for Family Participation in Patient Care: Yes (Comment) Care giver support system in place?: Yes (comment) Current home services: DME Criminal Activity/Legal Involvement Pertinent to Current Situation/Hospitalization:  No - Comment as needed  Activities of Daily Living      Permission Sought/Granted                  Emotional Assessment Appearance:: Appears stated age Attitude/Demeanor/Rapport: Engaged Affect (typically observed): Appropriate Orientation: : Oriented to Situation, Oriented to  Time, Oriented to Place, Oriented to Self   Psych Involvement: No (comment)  Admission diagnosis:  Hypoxia [R09.02] Acute respiratory failure with hypoxia ([name]) [J96.01] Patient Active Problem List   Diagnosis Date Noted  . Hypoxia 06/17/2020  . Acute respiratory failure with hypoxia and hypercarbia ([name]) 06/15/2020  . Acute respiratory failure with hypoxia ([name]) 06/14/2020  . Blister of foot 04/21/2020  . Exposure to mold 04/21/2020  . Constipation 04/03/2020  . Status post amputation of toe of right foot ([name]) 02/13/2020  . UTI (urinary tract infection) 06/28/2019  . Amputation above knee ([name]) 06/23/2019  . Left shoulder pain 06/23/2019  . Abscess of buttock, right 05/17/2019  . Severe protein-calorie malnutrition ([name])   . 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[name]) 04/11/2019  . Type 2 diabetes mellitus with diabetic polyneuropathy, with long-term current use of insulin ([name]) 04/11/2019  . Uncontrolled type 2 diabetes mellitus with insulin therapy ([name]) 04/16/2018  . Allergic rhinitis 03/06/2018  . Unilateral complete BKA, left, sequela ([name])   . Class   3 severe obesity due to excess calories with serious comorbidity and body mass index (BMI) of 50.0 to 59.9 in adult [facility])   . AKI (acute kidney injury) ([place])   . Decreased pedal pulses 06/10/2017  . Unilateral primary osteoarthritis, right knee 10/22/2016  . Primary osteoarthritis of first carpometacarpal joint of left hand 07/30/2016  . Diabetic neuropathy ([place]) 07/14/2016  . Nausea 03/17/2016  . De Quervain's tenosynovitis, bilateral 11/01/2015  . Vitamin D deficiency 09/05/2015  . Recurrent candidiasis of vagina 09/05/2015  . Varicose veins of leg  with complications 71/21/9758  . Restless leg syndrome 10/17/2014  . Chronic sinusitis 07/18/2014  . Encounter for chronic pain management 06/30/2013  . HLD (hyperlipidemia) 11/19/2012  . Chest pain 06/27/2012  . Right carpal tunnel syndrome 09/01/2011  . Bilateral knee pain 09/01/2011  . DM (diabetes mellitus) type II uncontrolled, periph vascular disorder ([place]) 05/22/2008  . Morbid obesity ([place]) 05/22/2008  . Obesity hypoventilation syndrome ([place]) 05/22/2008  . Depression 05/22/2008  . Obstructive sleep apnea 05/22/2008  . Hypertension 05/22/2008  . Asthma 05/22/2008  . GERD 05/22/2008   PCP:  [name], MD Pharmacy:   [facility] [place], [place] - [address] [place] [address] Phone: [phone] Fax: [phone]     Social Determinants of Health (SDOH) Interventions    Readmission Risk Interventions No flowsheet data found.

## 2020-06-19 NOTE — Progress Notes (Signed)
Pt currently refusing to wear BiPAP at this time. Placed patient on 4L of O2 and repositioned in the bed. During this time, I explained to the patient the importance of wearing the BiPAP. Patient stated she understood but still did not want to wear the BiPAP.   Shanon Rosser, RN

## 2020-06-19 NOTE — Progress Notes (Signed)
Inpatient Diabetes Program Recommendations  AACE/ADA: New Consensus Statement on Inpatient Glycemic Control (2015)  Target Ranges:  Prepandial:   less than 140 mg/dL      Peak postprandial:   less than 180 mg/dL (1-2 hours)      Critically ill patients:  140 - 180 mg/dL   Lab Results  Component Value Date   GLUCAP 345 (H) 06/19/2020   HGBA1C 12.7 (H) 06/14/2020    Review of Glycemic Control Results for [name], [name] (MRN [id number]) as of 06/19/2020 13:30  Ref. Range 06/18/2020 14:08 06/18/2020 17:28 06/18/2020 21:59 06/19/2020 08:19 06/19/2020 12:20  Glucose-Capillary Latest Ref Range: 70 - 99 mg/dL 288 (H) 248 (H) 180 (H) 220 (H) 345 (H)   Diabetes history: DM2 Outpatient Diabetes medications: Humulin 70/30 100 units am + 80 units pm + Victoza 1.2 qd Current orders for Inpatient glycemic control: Novolog 70/30 insulin 100 units bid   Inpatient Diabetes Program Recommendations:   Met with patient @ bedside to discuss patient did not receive 70/30 this am and also discussed with RN Fara Boros. Discussed if patient not eating can notify physician and change to Levemir bid with meal coverage as needed. When arrived to patient room, patient sitting with family members drinking a milk shake. The CBG 345 was prior to drinking the milk shake. Patient acknowledges understanding that the milk shake will further elevate her CBGs. Reviewed with patient the purpose in 70/30 insulin and Novolog correction insulin. Patient acknowledges understanding.  Thank you, Nani Gasser. Torrey Horseman, RN, MSN, CDE  Diabetes Coordinator Inpatient Glycemic Control Team Team Pager [phone] (8am-5pm) 06/19/2020 1:40 PM

## 2020-06-19 NOTE — Progress Notes (Signed)
NAME:  [name], MRN:  [id number], DOB:  03-Jul-1976, LOS: 2 ADMISSION DATE:  06/17/2020, CONSULTATION DATE:  7/14 REFERRING MD: Dr. Joya Gaskins, CHIEF COMPLAINT:  SOB   Brief History   44 y/o F with poorly controlled DM, chronic pain on opiates, BMI 61 and OSA on CPAP who was admitted 7/13   History of present illness   44 y/o F who presented to [facility] on 7/12 with reports of increased shortness of breath.   The patient was recently admitted from 7/9-7/11 and was discharged home on 2L oxygen.  She ran out of oxygen after returning home and noted increasing shortness of breath after which prompted her evaluation on the 7/12.  She has poorly controlled DM, OSA on CPAP, chronic pain on opiates and BMI of 61.  She notes increased nasal drainage, post nasal drip since starting oxygen. Additionally, she has seen occasional blood when she blows her nose.  She denies fevers, chills, n/v, diarrhea.  States she has noted increased LE swelling.  She is unable to lie flat due to dyspnea and the feeling that something heavy is lying on her chest.  She reports she wears her CPAP faithfully as she can not lie down without it.  They were unable to bleed in the oxygen to her home CPAP.  She reports that she was discharged on 2L but is requiring 4L here.  She notes her concentrator at home will "kick the O2 down a notch randomly" (ie: reduce the liter flow) and her tank re-charger is not working so she can not re-fill her portable tanks.   ABG evaluation 7/13 7.30 / 59 / 80 / 28.6.  Note CO2 on BMP runs around 27-28 at baseline.  BNP 34.  Hgb 11. No CXR evaluation this admission.   PCCM consulted for evaluation.   Past Medical History  OSA on CPAP - 04/25/20 sleep study with AHI 16.8, moderate obstructive sleep apnea, no central sleep apena COPD  Asthma  SVT HTN  HLD  Depression Morbid Obesity  GERD DM II  RLS  L BKA  Peripheral Neuropathy  Drug Seeking Behavior Bipolar Disorder / Depression  Anemia    Significant Hospital Events   7/13 Admit  7/14 PCCM consulted   Consults:    Procedures:    Significant Diagnostic Tests:  CTA Chest 7/10 >> no central obstructing pulmonary embolism identified, significant limitations in imaging due to artifact, probable mild edema vs air trapping bilaterally, no evidence of pneumonia, heart size within normal limits, no pericardial effusion ECHO 7/11 >> poor windows, LVEF estimated 55-60%, no RWMA, RV systolic function not well visualized  Micro Data:  COVID 7/10 >> negative   Antimicrobials:     Interim history/subjective:  Somewhat stunned female who is noncompliant with interventions Objective   Blood pressure (!) 159/100, pulse (!) 105, temperature 99 F (37.2 C), resp. rate 14, SpO2 97 %.    FiO2 (%):  [40 %] 40 %   Intake/Output Summary (Last 24 hours) at 06/19/2020 0844 Last data filed at 06/18/2020 1200 Gross per 24 hour  Intake 300 ml  Output --  Net 300 ml   There were no vitals filed for this visit.  Examination: General-morbidly obese female sitting in the bed has refused BiPAP.  Somewhat stunned appearance neurological-somewhat dull affect able answer questions but inconsistent stories versus the effects of been documented Short neck no JVD is appreciated Pulmonary decreased breath sounds throughout Cardiac heart sounds are distant Abdomen is obese soft nontender  Lower extremities with muscle wasting  Resolved Hospital Problem list     Assessment & Plan:   Dyspnea Acute on Chronic Hypercarbic Respiratory Failure Alveolar Hypoventilation  Suspected Pulmonary Hypertension  Suspect her shortness of breath is multifactorial in the setting of morbid obesity with resultant alveolar hypoventilation, OSA and hypoxemia.  ABG on admit showed acute hypercarbia.  Recent CTA chest negative.  ECHO with poor windows but normal LV function. Suspect she likely has a degree of diastolic dysfunction.  Note size of pulmonary  arteries on CTA chest, suspect she has pulmonary hypertension due to chronic hypoxia, OSA.  No history of underlying autoimmune disease. She smoked briefly for 6 months in her 20's and was exposed to second hand smoke. Does have air trapping vs edema on CT but BNP relatively low.   Continue BiPAP as needed O2 to keep O2 sats greater than 88% Sedation limited Bronchodilators With DC steroids Follow-up with Dr. Loanne Drilling She may end up intubated requiring tracheostomy      Moderate Obstructive Sleep ApneaOSA  AHI 16.8 on 04/2020 sleep study.  OHS BiPAP as tolerated although she refuses for the majority of time Case manager BiPAP to bleeding if patient will utilize Limit analgesics    All other issues per primary care  Best practice:  Diet: Carb Modified Pain/Anxiety/Delirium protocol (if indicated): n/a VAP protocol (if indicated): n/a  DVT prophylaxis: lovenox  GI prophylaxis: PPI  Glucose control: SSI Mobility: As tolerated  Code Status: Full Code  Family Communication: 06/19/2020 patient at bedside.  She has what appears to be minimal understanding of these and gives inconsistent stories based on chart events. Disposition: per primary   Labs   CBC: Recent Labs  Lab 06/14/20 0049 06/15/20 0636 06/17/20 1730 06/18/20 1224 06/19/20 0314  WBC 13.4* 12.4* 8.1 7.2 8.6  NEUTROABS 9.8*  --   --   --   --   HGB 11.4* 11.0* 11.1* 10.6* 10.4*  HCT 36.9 36.0 36.5 35.2* 34.9*  MCV 94.1 95.5 94.6 94.1 95.1  PLT 359 352 341 307 465    Basic Metabolic Panel: Recent Labs  Lab 06/14/20 0049 06/15/20 0636 06/17/20 1730 06/18/20 1224 06/19/20 0314  NA 137 136 137 134* 134*  K 3.8 5.1 3.9 4.5 5.0  CL 100 99 99 99 97*  CO2 24 26 27 22 29   GLUCOSE 353* 261* 240* 372* 246*  BUN 29* 49* 26* 28* 37*  CREATININE 1.19* 1.13* 0.97 0.89 1.18*  CALCIUM 8.7* 9.2 8.7* 8.6* 8.6*   GFR: Estimated Creatinine Clearance: 88.9 mL/min (A) (by C-G formula based on SCr of 1.18 mg/dL  (H)). Recent Labs  Lab 06/15/20 0636 06/17/20 1730 06/18/20 1224 06/19/20 0314  WBC 12.4* 8.1 7.2 8.6    Liver Function Tests: Recent Labs  Lab 06/14/20 0049 06/17/20 1730  AST 12* 9*  ALT 16 15  ALKPHOS 98 91  BILITOT 0.3 0.3  PROT 8.1 6.9  ALBUMIN 3.4* 2.8*   No results for input(s): LIPASE, AMYLASE in the last 168 hours. No results for input(s): AMMONIA in the last 168 hours.  ABG    Component Value Date/Time   PHART 7.306 (L) 06/17/2020 1732   PCO2ART 59.2 (H) 06/17/2020 1732   PO2ART 80.9 (L) 06/17/2020 1732   HCO3 28.6 (H) 06/17/2020 1732   TCO2 22 12/27/2014 0823   ACIDBASEDEF 0.4 05/08/2019 2030   O2SAT 95.3 06/17/2020 1732     Coagulation Profile: No results for input(s): INR, PROTIME in the last 168 hours.  Cardiac Enzymes: No results for input(s): CKTOTAL, CKMB, CKMBINDEX, TROPONINI in the last 168 hours.  HbA1C: HbA1c POC (<> result, manual entry)  Date/Time Value Ref Range Status  04/03/2020 10:25 AM >15.0 4.0 - 5.6 % Final   Hgb A1c MFr Bld  Date/Time Value Ref Range Status  06/14/2020 07:22 AM 12.7 (H) 4.8 - 5.6 % Final    Comment:    (NOTE) Pre diabetes:          5.7%-6.4%  Diabetes:              >6.4%  Glycemic control for   <7.0% adults with diabetes   05/17/2019 06:40 AM 17.5 (H) 4.8 - 5.6 % Final    Comment:    (NOTE) Pre diabetes:          5.7%-6.4% Diabetes:              >6.4% Glycemic control for   <7.0% adults with diabetes     CBG: Recent Labs  Lab 06/18/20 1050 06/18/20 1408 06/18/20 1728 06/18/20 2159 06/19/20 0819  GLUCAP 378* 288* 248* 180* 220*    Critical care time: n/a     Richardson Landry Tuana Hoheisel ACNP Acute Care Nurse Practitioner [place] Please consult Amion 06/19/2020, 8:44 AM

## 2020-06-19 NOTE — Progress Notes (Signed)
FPTS Interim Progress Note   Patient continues to exhibit signs of hypercapnia associated with morbid obesity that is causing thoracic restriction.  Interruption or failure to provide NIV would quickly lead to exacerbation of the patient's condition, hospital admission and likely harm to the patient.  Continued use is preferred.  The use of the NIV will treat the patient's high PCO2 levels and can reduce the risk of exacerbations and future hospitalizations when used at night and during the day. BiLevel/RAD has been considered and ruled out as patient requires continuous alarms, backup battery and portability which are not possible with BiLevel/RAD devices.  Ventilation is required to decrease the work of breathing and improve pulmonary status.  Interruption of ventilator support would lead to decline of health status.  Patient is able to protect her airways and clear secretions on their own.   BP (!) 159/100 (BP Location: Left Arm)    Pulse (!) 105    Temp 99 F (37.2 C)    Resp 14    SpO2 92%       Donney Dice, DO 06/19/2020, 4:20 PM PGY-1, [place] Medicine Service pager [phone]

## 2020-06-19 NOTE — Progress Notes (Signed)
FPTS Interim Progress Note  S:Nurse paged reporting that the patient had abruptly awoken and santached her bipap off claiming that she was "trying to catch my breath!." RT was called and explained to patient about importance of remaining on BiPAP and patient was compliant. I saw the patient with the BiPAP back on. Patient reported that she felt better and was no longer feeling the SOB.  O: BP 118/67 (BP Location: Right Arm)   Pulse 91   Temp 97.9 F (36.6 C) (Oral)   Resp (!) 22   SpO2 98%   Gen: Patient was sitting upright on the edge of her bed with her BiPaP on, able to follow simple commands and although she was unable to talk due to BiPAP she attempted to answer with nods Resp: No crackles were hear on exam, patient on BiPAP CV: Regular rate and rthym  A/P: Patient appears stable at this time as she has started her BiPAP again. - Continue to monitor patient O2 Sat and RR - If patient deteriorates consult CCM - Monitor mental status  - If patient is noncompliant consult CCM  Freida Busman, MD 06/19/2020, 12:28 AM PGY-1, [place] [facility] Service pager [phone]

## 2020-06-20 ENCOUNTER — Other Ambulatory Visit: Payer: Self-pay | Admitting: Family Medicine

## 2020-06-20 DIAGNOSIS — J9602 Acute respiratory failure with hypercapnia: Secondary | ICD-10-CM | POA: Diagnosis not present

## 2020-06-20 DIAGNOSIS — E1140 Type 2 diabetes mellitus with diabetic neuropathy, unspecified: Secondary | ICD-10-CM | POA: Diagnosis not present

## 2020-06-20 DIAGNOSIS — J9601 Acute respiratory failure with hypoxia: Secondary | ICD-10-CM | POA: Diagnosis not present

## 2020-06-20 LAB — GLUCOSE, CAPILLARY
Glucose-Capillary: 231 mg/dL — ABNORMAL HIGH (ref 70–99)
Glucose-Capillary: 210 mg/dL — ABNORMAL HIGH (ref 70–99)
Glucose-Capillary: 193 mg/dL — ABNORMAL HIGH (ref 70–99)
Glucose-Capillary: 143 mg/dL — ABNORMAL HIGH (ref 70–99)
Glucose-Capillary: 160 mg/dL — ABNORMAL HIGH (ref 70–99)
Glucose-Capillary: 96 mg/dL (ref 70–99)

## 2020-06-20 LAB — BASIC METABOLIC PANEL
Anion gap: 8 (ref 5–15)
BUN: 35 mg/dL — ABNORMAL HIGH (ref 6–20)
CO2: 29 mmol/L (ref 22–32)
Calcium: 8.6 mg/dL — ABNORMAL LOW (ref 8.9–10.3)
Chloride: 98 mmol/L (ref 98–111)
Creatinine, Ser: 1.04 mg/dL — ABNORMAL HIGH (ref 0.44–1.00)
GFR calc Af Amer: >60 mL/min (ref >60)
GFR calc non Af Amer: >60 mL/min (ref >60)
Glucose, Bld: 261 mg/dL — ABNORMAL HIGH (ref 70–99)
Potassium: 4.2 mmol/L (ref 3.5–5.1)
Sodium: 135 mmol/L (ref 135–145)

## 2020-06-20 LAB — BLOOD GAS, ARTERIAL
Acid-Base Excess: 4.6 mmol/L — ABNORMAL HIGH (ref 0.0–2.0)
Bicarbonate: 30 mmol/L — ABNORMAL HIGH (ref 20.0–28.0)
Drawn by: 27022
FIO2: 36
O2 Saturation: 95.9 %
Patient temperature: 36.5
pCO2 arterial: 55.6 mmHg — ABNORMAL HIGH (ref 32.0–48.0)
pH, Arterial: 7.348 — ABNORMAL LOW (ref 7.350–7.450)
pO2, Arterial: 79.4 mmHg — ABNORMAL LOW (ref 83.0–108.0)

## 2020-06-20 IMAGING — DX PORTABLE CHEST - 1 VIEW
1 series · 1 of 1 positions shown · non-contrast
Comparison: Chest x-ray 03/27/2019.

CLINICAL DATA: 43-year-old female with history of worsening
shortness of breath, cough, chills, fever and body aches. Chest
pain.

EXAM:
PORTABLE CHEST 1 VIEW

[chest ap]
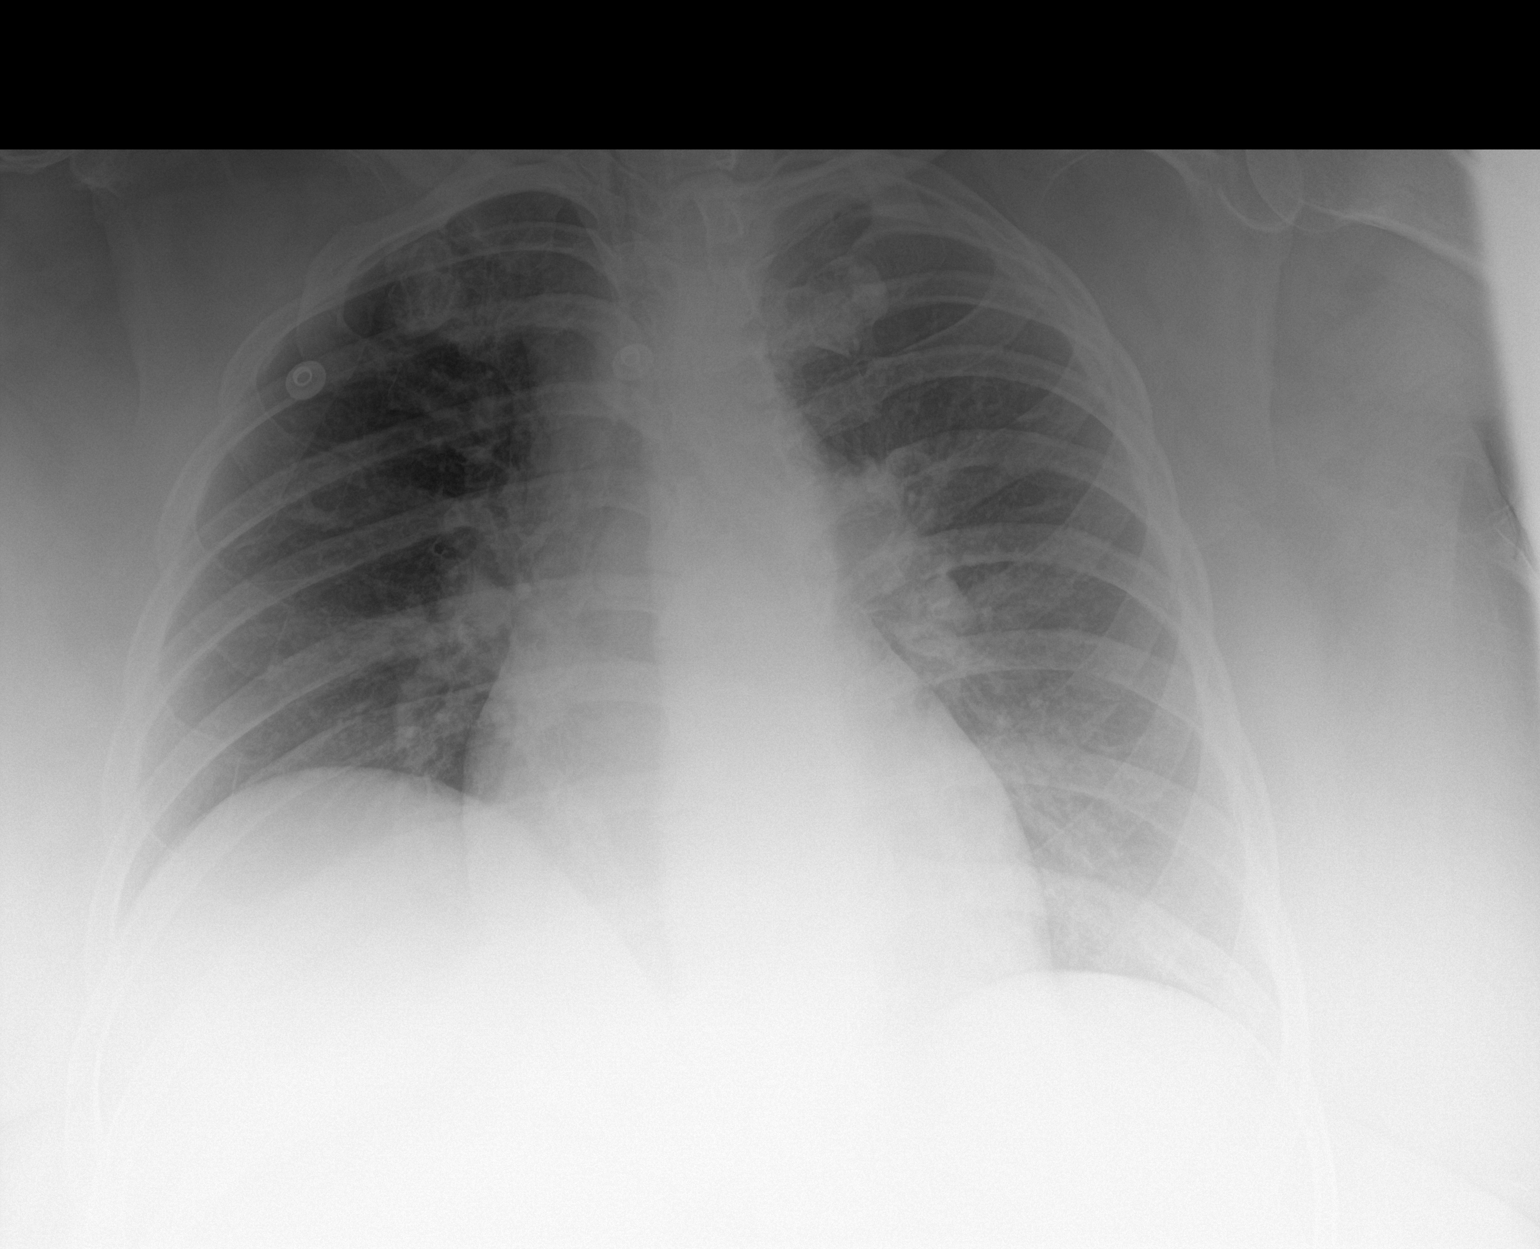

[1 of 1 positions shown; findings below may reference images not displayed]

FINDINGS: Chronic elevation of the right hemidiaphragm again noted. Lung
volumes are normal. No consolidative airspace disease. No pleural
effusions. No pneumothorax. No pulmonary nodule or mass noted.
Pulmonary vasculature and the cardiomediastinal silhouette are
within normal limits.
IMPRESSION: 1.  No radiographic evidence of acute cardiopulmonary disease.

## 2020-06-20 MED ORDER — BENZONATATE 100 MG PO CAPS
100.0000 mg | ORAL_CAPSULE | Freq: Two times a day (BID) | ORAL | Status: DC | PRN
  Administered 2020-06-21 – 2020-06-22 (×2): 100 mg via ORAL
  Filled 2020-06-20 (×2): qty 1

## 2020-06-20 MED ORDER — PULSE OXIMETER FOR FINGER MISC: 0 refills | Status: DC

## 2020-06-20 MED ORDER — INSULIN ASPART PROT & ASPART (70-30 MIX) 100 UNIT/ML ~~LOC~~ SUSP
90.0000 [IU] | Freq: Every day | SUBCUTANEOUS | Status: DC
  Administered 2020-06-20 – 2020-06-22 (×3): 90 [IU] via SUBCUTANEOUS

## 2020-06-20 MED ORDER — INSULIN ASPART PROT & ASPART (70-30 MIX) 100 UNIT/ML ~~LOC~~ SUSP
110.0000 [IU] | Freq: Every day | SUBCUTANEOUS | Status: DC
  Administered 2020-06-21 – 2020-06-23 (×3): 110 [IU] via SUBCUTANEOUS
  Filled 2020-06-20: qty 10

## 2020-06-20 MED ORDER — INSULIN ASPART PROT & ASPART (70-30 MIX) 100 UNIT/ML ~~LOC~~ SUSP
10.0000 [IU] | Freq: Once | SUBCUTANEOUS | Status: AC
  Administered 2020-06-20: 10 [IU] via SUBCUTANEOUS
  Filled 2020-06-20: qty 10

## 2020-06-20 MED ORDER — MISC. DEVICES MISC: 0 refills | Status: DC

## 2020-06-20 NOTE — Progress Notes (Signed)
NAME:  [name], MRN:  [id number], DOB:  1976/05/02, LOS: 3 ADMISSION DATE:  06/17/2020, CONSULTATION DATE:  7/14 REFERRING MD: Dr. Joya Gaskins, CHIEF COMPLAINT:  SOB   Brief History   44 y/o F with poorly controlled DM, chronic pain on opiates, BMI 61 and OSA on CPAP who was admitted 7/13   History of present illness   44 y/o F who presented to [facility] on 7/12 with reports of increased shortness of breath.   The patient was recently admitted from 7/9-7/11 and was discharged home on 2L oxygen.  She ran out of oxygen after returning home and noted increasing shortness of breath after which prompted her evaluation on the 7/12.  She has poorly controlled DM, OSA on CPAP, chronic pain on opiates and BMI of 61.  She notes increased nasal drainage, post nasal drip since starting oxygen. Additionally, she has seen occasional blood when she blows her nose.  She denies fevers, chills, n/v, diarrhea.  States she has noted increased LE swelling.  She is unable to lie flat due to dyspnea and the feeling that something heavy is lying on her chest.  She reports she wears her CPAP faithfully as she can not lie down without it.  They were unable to bleed in the oxygen to her home CPAP.  She reports that she was discharged on 2L but is requiring 4L here.  She notes her concentrator at home will "kick the O2 down a notch randomly" (ie: reduce the liter flow) and her tank re-charger is not working so she can not re-fill her portable tanks.   ABG evaluation 7/13 7.30 / 59 / 80 / 28.6.  Note CO2 on BMP runs around 27-28 at baseline.  BNP 34.  Hgb 11. No CXR evaluation this admission.   PCCM consulted for evaluation.   Past Medical History  OSA on CPAP - 04/25/20 sleep study with AHI 16.8, moderate obstructive sleep apnea, no central sleep apena COPD  Asthma  SVT HTN  HLD  Depression Morbid Obesity  GERD DM II  RLS  L BKA  Peripheral Neuropathy  Drug Seeking Behavior Bipolar Disorder / Depression  Anemia    Significant Hospital Events   7/13 Admit  7/14 PCCM consulted   Consults:    Procedures:    Significant Diagnostic Tests:  CTA Chest 7/10 >> no central obstructing pulmonary embolism identified, significant limitations in imaging due to artifact, probable mild edema vs air trapping bilaterally, no evidence of pneumonia, heart size within normal limits, no pericardial effusion ECHO 7/11 >> poor windows, LVEF estimated 55-60%, no RWMA, RV systolic function not well visualized  Micro Data:  COVID 7/10 >> negative   Antimicrobials:     Interim history/subjective:  Somewhat stunned female who is noncompliant with interventions Objective   Blood pressure 129/79, pulse 90, temperature 97.8 F (36.6 C), resp. rate 19, SpO2 99 %.    FiO2 (%):  [40 %] 40 %   Intake/Output Summary (Last 24 hours) at 06/20/2020 1057 Last data filed at 06/20/2020 0900 Gross per 24 hour  Intake 240 ml  Output 1600 ml  Net -1360 ml    Examination: General morbid obese female sitting up in bed appears much more awake and interactive HEENT no JVD is appreciated Decreased breath sounds throughout Heart sounds are distant Lower extremity edema is appreciated  Resolved Hospital Problem list     Assessment & Plan:   Dyspnea Acute on Chronic Hypercarbic Respiratory Failure Alveolar Hypoventilation  Suspected Pulmonary Hypertension  Suspect her shortness of breath is multifactorial in the setting of morbid obesity with resultant alveolar hypoventilation, OSA and hypoxemia.  ABG on admit showed acute hypercarbia.  Recent CTA chest negative.  ECHO with poor windows but normal LV function. Suspect she likely has a degree of diastolic dysfunction.  Note size of pulmonary arteries on CTA chest, suspect she has pulmonary hypertension due to chronic hypoxia, OSA.  No history of underlying autoimmune disease. She smoked briefly for 6 months in her 20's and was exposed to second hand smoke. Does have air  trapping vs edema on CT but BNP relatively low.  BiPAP as below Wean FiO2 to keep sats 88% Nightly BiPAP Will need home BiPAP Pulmonary critical care will sign off at this time.   Moderate Obstructive Sleep ApneaOSA  Tolerated BiPAP throughout the night Much more awake She will need home BiPAP appears to be willing to be consistent with wearing it.   All other issues per primary care  Best practice:  Diet: Carb Modified Pain/Anxiety/Delirium protocol (if indicated): n/a VAP protocol (if indicated): n/a  DVT prophylaxis: lovenox  GI prophylaxis: PPI  Glucose control: SSI Mobility: As tolerated  Code Status: Full Code  Family Communication: 06/19/2020 patient at bedside.  She has what appears to be minimal understanding of these and gives inconsistent stories based on chart events. Disposition: per primary   Labs   CBC: Recent Labs  Lab 06/14/20 0049 06/15/20 0636 06/17/20 1730 06/18/20 1224 06/19/20 0314  WBC 13.4* 12.4* 8.1 7.2 8.6  NEUTROABS 9.8*  --   --   --   --   HGB 11.4* 11.0* 11.1* 10.6* 10.4*  HCT 36.9 36.0 36.5 35.2* 34.9*  MCV 94.1 95.5 94.6 94.1 95.1  PLT 359 352 341 307 947    Basic Metabolic Panel: Recent Labs  Lab 06/15/20 0636 06/17/20 1730 06/18/20 1224 06/19/20 0314 06/20/20 0221  NA 136 137 134* 134* 135  K 5.1 3.9 4.5 5.0 4.2  CL 99 99 99 97* 98  CO2 26 27 22 29 29   GLUCOSE 261* 240* 372* 246* 261*  BUN 49* 26* 28* 37* 35*  CREATININE 1.13* 0.97 0.89 1.18* 1.04*  CALCIUM 9.2 8.7* 8.6* 8.6* 8.6*   GFR: Estimated Creatinine Clearance: 100.9 mL/min (A) (by C-G formula based on SCr of 1.04 mg/dL (H)). Recent Labs  Lab 06/15/20 0636 06/17/20 1730 06/18/20 1224 06/19/20 0314  WBC 12.4* 8.1 7.2 8.6    Liver Function Tests: Recent Labs  Lab 06/14/20 0049 06/17/20 1730  AST 12* 9*  ALT 16 15  ALKPHOS 98 91  BILITOT 0.3 0.3  PROT 8.1 6.9  ALBUMIN 3.4* 2.8*   No results for input(s): LIPASE, AMYLASE in the last 168  hours. No results for input(s): AMMONIA in the last 168 hours.  ABG    Component Value Date/Time   PHART 7.306 (L) 06/17/2020 1732   PCO2ART 59.2 (H) 06/17/2020 1732   PO2ART 80.9 (L) 06/17/2020 1732   HCO3 28.6 (H) 06/17/2020 1732   TCO2 22 12/27/2014 0823   ACIDBASEDEF 0.4 05/08/2019 2030   O2SAT 95.3 06/17/2020 1732     Coagulation Profile: No results for input(s): INR, PROTIME in the last 168 hours.  Cardiac Enzymes: No results for input(s): CKTOTAL, CKMB, CKMBINDEX, TROPONINI in the last 168 hours.  HbA1C: HbA1c POC (<> result, manual entry)  Date/Time Value Ref Range Status  04/03/2020 10:25 AM >15.0 4.0 - 5.6 % Final   Hgb A1c MFr Bld  Date/Time Value Ref  Range Status  06/14/2020 07:22 AM 12.7 (H) 4.8 - 5.6 % Final    Comment:    (NOTE) Pre diabetes:          5.7%-6.4%  Diabetes:              >6.4%  Glycemic control for   <7.0% adults with diabetes   05/17/2019 06:40 AM 17.5 (H) 4.8 - 5.6 % Final    Comment:    (NOTE) Pre diabetes:          5.7%-6.4% Diabetes:              >6.4% Glycemic control for   <7.0% adults with diabetes     CBG: Recent Labs  Lab 06/19/20 1956 06/19/20 2127 06/20/20 0401 06/20/20 0555 06/20/20 0816  GLUCAP 364* 360* 231* 210* 193*    Critical care time: n/a     Richardson Landry [name] ACNP Acute Care Nurse Practitioner [place] Please consult Amion 06/20/2020, 10:57 AM

## 2020-06-20 NOTE — Progress Notes (Signed)
Family Medicine Teaching Service Daily Progress Note Intern Pager: [phone]  Patient name: [name] Medical record number: [id number] Date of birth: 1976/03/02 Age: 44 y.o. Gender: female  Primary Care Provider: [name], MD Consultants: Pulmonology  Code Status: Full Code   Pt Overview and Major Events to Date:  Admitted 06/17/2020  Assessment and Plan: [name] a 44 y.o.femalepresenting as a direct admissionwith dyspneafor acute hypoxic respiratory failure in the setting of 1 month history with worsening shortness of breath. PMH is significant forpoorly controlled type 2 diabetes, depression, OSA, hypertension, morbid obesity, OHSandleft BKA.   Acute hypoxic, hypercarbic respiratory failure: Worsening. Patient reports her symptoms started after moving intoa new apartment where she has been concerned for mold.Patient's acute hypoxic respiratory failure likely multifactorial. Differentials include interstitial lung disease due to environmental factors (mold), previous admission diagnosis of OSH, positive RSVand asthma exacerbation (given patient's history of asthma). BNP within normal limits of 34. Chest x-ray demonstrated mild peribronchial thickening with increasing basilar atelectasis. Patient complained of dry cough that affected her dyspnea.  -Pulmonologyconsulted and on board -R DVT U/Sperformed demonstrated no evidence of DVT and no cystic structure in the popliteal fossa, but exam was limited. -Continue oral Lasix 40 mg -[id number] -Continue prednisone 40 mg daily(will monitor glucose closely) -Oxygen therapy as needed -Continue to monitor -Wean 4L oxygen given 99% ox sat -Tessalon -Follow up with PCPand get PFTs outpatient -Working on getting patient appropriate oxygen therapy for home use. Spoke to the company regarding her oxygen machine to ensure proper setup after discharge -Ordered home health PT/OT for discharge   Atypical chest  pain: Chronic, stable. Denies chest pain today. Intermittently present for the past several months, has already seen her cardiologist outpatient for this and had a low risk Lexiscan in 05/2020 suggesting against cardiac etiology. Troponin 11-13 consistently on several checks through 05/12/2020.  -AwaitingEKG results -Troponin recheck if chest pain arises again -Monitor for recurrence -Cardiology outpatient   Mild asthma: Chronic. Previously known to be well controlled, her current respiratory status for the past month is unusual for her.Worsening dyspnea started in December when she moved to her new place that is concerning for mold. Since December, dyspnea progressively worsening. Prior to December, patient only used an inhaler as needed which was a few times a year according to the patient. -Continue prednisone, duoneband breathing treatments  -Continue home Symbicort, formulary Dulera on instead  T2DM: Chronic, uncontrolled. A1c 12.7 on 06/14/2020. Following closely with her PCP and Dr. [facility] to improve this. Currently takes Humulin 70/30 100U in am and 80U in pm,endorses compliance.CBG today 231 from [phone] yesterday. Blood glucose 261 today. -CBGs with meals and nightly -increase to Novolog 110 units at breakfast and 90 supper  -resistant sliding scale   Hypertension: Chronic, stable. BP today is129/79and147/101 on arrival. Takes Norvasc and metoprolol at home. -Continue home Norvasc and metoprolol -Continue to monitor BP  Hyperlipidemia: Chronic, stable. Most recent lipid panel from 04/10/2020 demonstrated cholesterol of 274, triglycerides 324, HDL 39 and LDL 173.Continue home Lipitor 40 mg. -Follow up with PCP at discharge  OSA: Chronic, stable. Patient wearing BiPAP as recommended by pulmonology. Previously wore CPAP at night, has not been able to do this due to new oxygen requirementof wearing nasal canulawith machine at homeand not being  set up with it prior to discharge on previous hospitalization.  -Continue BiPAP -Discussed with company manufacturing oxygen machine to ensure patient has proper oxygen equipment once discharged   Morbid obesity: Chronic, stable. BMI  61. Will qualify for weight reduction surgical intervention if desired. -Follow-up with PCP outpatientto discuss appropriate lifestyle modifications to incorporate after discharge  Chronic pain syndrome:Stable. Denies any localized or generalized pain today.Takes gabapentin and Norco as needed at home. PMP aware reviewed and appropriate. -Continue home gabapentin and Norco10-325 q6 PRN -Flexeril discontinued   Restless leg syndrome: Chronic, stable. Home meds include ropinirole at bedtime. -Continue home med ropinirole  FEN/GI: Carb modified diet  PPx: Lovenox   Disposition: Inpatient   Subjective:  Patient seen and examined at bedside, complains of dyspnea that is worsened by dry cough most likely due to oxygen machine. Tessalon ordered. Patient reports eating and sleeping well. She denies chest, pain, dizziness, nausea, vomiting and generalized pain. She reports comfortably sleeping with her BiPAP last night.   Objective: Temp:  [97.8 F (36.6 C)-98.3 F (36.8 C)] 97.8 F (36.6 C) (07/16 0814) Pulse Rate:  [81-93] 90 (07/16 0814) Resp:  [13-20] 19 (07/16 0814) BP: (105-129)/(68-79) 129/79 (07/16 0814) SpO2:  [92 %-99 %] 99 % (07/16 0814) FiO2 (%):  [40 %] 40 % (07/15 2333) Physical Exam: General: Patient in no acute distress Cardiovascular: regular rate and rhythm, mo murmurs or gallops noted  Respiratory: lungs clear to auscultation bilaterally, no decreased movement of air noted  Abdomen: nontender on palpation, active bowel sounds  Extremities: distal pulse intact on the right LE, 2+ pitting edema right LE, left below knee amputation  Psych: mood appropriate, no agitation noted   Laboratory: Recent Labs  Lab 06/17/20 1730  06/18/20 1224 06/19/20 0314  WBC 8.1 7.2 8.6  HGB 11.1* 10.6* 10.4*  HCT 36.5 35.2* 34.9*  PLT 341 307 314   Recent Labs  Lab 06/14/20 0049 06/15/20 0636 06/17/20 1730 06/17/20 1730 06/18/20 1224 06/19/20 0314 06/20/20 0221  NA 137   < > 137   < > 134* 134* 135  K 3.8   < > 3.9   < > 4.5 5.0 4.2  CL 100   < > 99   < > 99 97* 98  CO2 24   < > 27   < > 22 29 29   BUN 29*   < > 26*   < > 28* 37* 35*  CREATININE 1.19*   < > 0.97   < > 0.89 1.18* 1.04*  CALCIUM 8.7*   < > 8.7*   < > 8.6* 8.6* 8.6*  PROT 8.1  --  6.9  --   --   --   --   BILITOT 0.3  --  0.3  --   --   --   --   ALKPHOS 98  --  91  --   --   --   --   ALT 16  --  15  --   --   --   --   AST 12*  --  9*  --   --   --   --   GLUCOSE 353*   < > 240*   < > 372* 246* 261*   < > = values in this interval not displayed.      Imaging/Diagnostic Tests: No results found.  Donney Dice, DO 06/20/2020, 12:21 PM PGY-1, [place] Intern pager: [phone], text pages welcome

## 2020-06-20 NOTE — Progress Notes (Addendum)
Physical Therapy Treatment Patient Details Name: [name] MRN: [id number] DOB: June 05, 1976 Today's Date: 06/20/2020    History of Present Illness [name] is a 44 y.o. 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 Pt with recent d/c from [facility] with increased O2 requirements, readmitted to [facility] on 7/12 for similar.    PT Comments    Pt eager to mobilize OOB, walking room distance and participating in x2 sit to stands during session. Pt with increased work of breathing and multiple coughing bouts this session, but maintained sats 96% and greater when assessed. PT emphasized breathing technique and rest breaks as needed while mobilizing. PT to continue to follow acutely.   Of note, pt requesting shrinkers for LLE residual limb from [employer] while acute.    Follow Up Recommendations  Home health PT     Equipment Recommendations  None recommended by PT    Recommendations for Other Services       Precautions / Restrictions Precautions Precautions: Fall Precaution Comments: L BKA - prosthesis in room Restrictions Weight Bearing Restrictions: No    Mobility  Bed Mobility Overal bed mobility: Needs Assistance             General bed mobility comments: sitting EOB upon PT arrival, up in recliner upon PT exit. PT assist for donning LLE prosthesis and sleeve.  Transfers Overall transfer level: Needs assistance Equipment used: Rolling walker (2 wheeled) Transfers: Sit to/from [facility] Sit to Stand: Min guard Stand pivot transfers: Min guard       General transfer comment: for safety only, increased time and has to rock prior to initiate standing for momentum building. Transferred to and from [facility], PT had to clean up pt after urination  and BM as pt unable.  Ambulation/Gait Ambulation/Gait assistance: Min guard Gait Distance (Feet): 25 Feet Assistive device: Rolling walker (2 wheeled) (bari) Gait Pattern/deviations: Step-through pattern;Decreased stride length;Trunk flexed;Wide base of support Gait velocity: dcr   General Gait Details: min guard for safety, verbal cuing for upright posture, breathing technique. Pt with multiple coughing bouts and DOE 3/4, SpO2 96% on 4LO2.   Stairs             Wheelchair Mobility    Modified Rankin (Stroke Patients Only)       Balance Overall balance assessment: Needs assistance Sitting-balance support: No upper extremity supported;Feet supported Sitting balance-Leahy Scale: Good     Standing balance support: Bilateral upper extremity supported Standing balance-Leahy Scale: Fair                              Cognition Arousal/Alertness: Awake/alert Behavior During Therapy: WFL for tasks assessed/performed Overall Cognitive Status: Within Functional Limits for tasks assessed                                        Exercises      General Comments        Pertinent Vitals/Pain Pain Assessment: No/denies pain    Home Living                      Prior Function  PT Goals (current goals can now be found in the care plan section) Acute Rehab PT Goals Patient Stated Goal: increase activity tolerance PT Goal Formulation: With patient Time For Goal Achievement: 07/02/20 Potential to Achieve Goals: Good Progress towards PT goals: Progressing toward goals    Frequency    Min 3X/week      PT Plan Current plan remains appropriate    Co-evaluation              AM-PAC PT "6 Clicks" Mobility   Outcome Measure  Help needed turning from your back to your side while in a flat bed without using bedrails?: A Little Help needed moving from lying on your back to sitting on the side of a flat bed without  using bedrails?: A Little Help needed moving to and from a bed to a chair (including a wheelchair)?: A Little Help needed standing up from a chair using your arms (e.g., wheelchair or bedside chair)?: A Little Help needed to walk in hospital room?: A Little Help needed climbing 3-5 steps with a railing? : A Lot 6 Click Score: 17    End of Session Equipment Utilized During Treatment: Oxygen Activity Tolerance: Patient tolerated treatment well;Patient limited by fatigue Patient left: with call bell/phone within reach;with nursing/sitter in room;in chair Nurse Communication: Mobility status PT Visit Diagnosis: Muscle weakness (generalized) (M62.81);Unsteadiness on feet (R26.81);Difficulty in walking, not elsewhere classified (R26.2)     Time: 1100-1135 PT Time Calculation (min) (ACUTE ONLY): 35 min  Charges:  $Gait Training: 8-22 mins $Therapeutic Activity: 8-22 mins                     Krystyn Picking E, PT Acute Rehabilitation Services Pager [phone]  Office [phone]  Mckenzie Bove D Elonda Husky 06/20/2020, 12:36 PM

## 2020-06-21 DIAGNOSIS — E1140 Type 2 diabetes mellitus with diabetic neuropathy, unspecified: Secondary | ICD-10-CM | POA: Diagnosis not present

## 2020-06-21 DIAGNOSIS — J9601 Acute respiratory failure with hypoxia: Secondary | ICD-10-CM | POA: Diagnosis not present

## 2020-06-21 DIAGNOSIS — J9602 Acute respiratory failure with hypercapnia: Secondary | ICD-10-CM | POA: Diagnosis not present

## 2020-06-21 LAB — BASIC METABOLIC PANEL
Anion gap: 9 (ref 5–15)
BUN: 35 mg/dL — ABNORMAL HIGH (ref 6–20)
CO2: 28 mmol/L (ref 22–32)
Calcium: 8.5 mg/dL — ABNORMAL LOW (ref 8.9–10.3)
Chloride: 101 mmol/L (ref 98–111)
Creatinine, Ser: 0.83 mg/dL (ref 0.44–1.00)
GFR calc Af Amer: >60 mL/min (ref >60)
GFR calc non Af Amer: >60 mL/min (ref >60)
Glucose, Bld: 104 mg/dL — ABNORMAL HIGH (ref 70–99)
Potassium: 4.2 mmol/L (ref 3.5–5.1)
Sodium: 138 mmol/L (ref 135–145)

## 2020-06-21 LAB — GLUCOSE, CAPILLARY
Glucose-Capillary: 61 mg/dL — ABNORMAL LOW (ref 70–99)
Glucose-Capillary: 73 mg/dL (ref 70–99)
Glucose-Capillary: 87 mg/dL (ref 70–99)
Glucose-Capillary: 146 mg/dL — ABNORMAL HIGH (ref 70–99)
Glucose-Capillary: 162 mg/dL — ABNORMAL HIGH (ref 70–99)
Glucose-Capillary: 212 mg/dL — ABNORMAL HIGH (ref 70–99)
Glucose-Capillary: 255 mg/dL — ABNORMAL HIGH (ref 70–99)

## 2020-06-21 LAB — CBC
HCT: 33.5 % — ABNORMAL LOW (ref 36.0–46.0)
Hemoglobin: 10.3 g/dL — ABNORMAL LOW (ref 12.0–15.0)
MCH: 29.2 pg (ref 26.0–34.0)
MCHC: 30.7 g/dL (ref 30.0–36.0)
MCV: 94.9 fL (ref 80.0–100.0)
Platelets: 298 10*3/uL (ref 150–400)
RBC: 3.53 MIL/uL — ABNORMAL LOW (ref 3.87–5.11)
RDW: 13.5 % (ref 11.5–15.5)
WBC: 11.7 10*3/uL — ABNORMAL HIGH (ref 4.0–10.5)
nRBC: 0 % (ref 0.0–0.2)

## 2020-06-21 NOTE — TOC Initial Note (Addendum)
Transition of Care [facility]) - Initial/Assessment Note    Patient Details  Name: [name] MRN: [id number] Date of Birth: 1976/01/04  Transition of Care [facility]) CM/SW Contact:    Carles Collet, RN Phone Number: 06/21/2020, 2:08 PM  Clinical Narrative:      [name] w patient she confirms:  Oxygen- 4 tanks for transport home in room. Has concentrator at home. She states the plan is for her to call [employer] when she gets home to come out and look at her concentrator.  NIV- referral placed to [employer], waiting to hear back from liaison if she is able to get it today. UPDATE- NIV on backorder- it will be available Monday  Other DME- Hospital bed order and note placed. Referral made to [employer]. Patient states that a space has been cleared at home and she is agreeable to go home before bed is delivered as she is able to get up and down and walk from chair to bed. Bed will be delivered to house Monday  Transportation- She states she has a ride home when needed.  HH PT OT- CM working on finding provider. Explained to patient that this may not be possible based on her insurance coverage. She states that she is currently going to OP therapies and if needed she will continue as OP.  Update- unable to find [name] provider. Patient will continue with OP therapies.                  Expected Discharge Plan: [place] Barriers to Discharge: Continued Medical Work up   Patient Goals and CMS Choice Patient states their goals for this hospitalization and ongoing recovery are:: to go home CMS Medicare.gov Compare Post Acute Care list provided to:: Patient Choice offered to / list presented to : Patient  Expected Discharge Plan and Services Expected Discharge Plan: [place]       Living arrangements for the past 2 months: Apartment                 DME Arranged: NIV, Oxygen, Hospital bed DME Agency: [employer] Date DME Agency Contacted: 06/21/20 Time DME Agency Contacted:  52 Representative spoke with at DME Agency: Thedore Mins and Uncertain            Prior Living Arrangements/Services Living arrangements for the past 2 months: [place] with:: Parents, Minor Children   Do you feel safe going back to the place where you live?: Yes      Need for Family Participation in Patient Care: Yes (Comment) Care giver support system in place?: Yes (comment) Current home services: DME Criminal Activity/Legal Involvement Pertinent to Current Situation/Hospitalization: No - Comment as needed  Activities of Daily Living      Permission Sought/Granted                  Emotional Assessment Appearance:: Appears stated age Attitude/Demeanor/Rapport: Engaged Affect (typically observed): Appropriate Orientation: : Oriented to Situation, Oriented to  Time, Oriented to Place, Oriented to Self   Psych Involvement: No (comment)  Admission diagnosis:  Hypoxia [R09.02] Acute respiratory failure with hypoxia ([employer]) [J96.01] Patient Active Problem List   Diagnosis Date Noted  . Hypoxia 06/17/2020  . Acute respiratory failure with hypoxia and hypercarbia ([place]) 06/15/2020  . Acute respiratory failure with hypoxia ([place]) 06/14/2020  . Blister of foot 04/21/2020  . Exposure to mold 04/21/2020  . Constipation 04/03/2020  . Status post amputation of toe of right foot ([place]) 02/13/2020  .  UTI (urinary tract infection) 06/28/2019  . Amputation above knee ([place]) 06/23/2019  . Left shoulder pain 06/23/2019  . Abscess of buttock, right 05/17/2019  . Severe protein-calorie malnutrition ([place])   . 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[place]) 04/11/2019  . Type 2 diabetes mellitus with diabetic polyneuropathy, with long-term current use of insulin ([place]) 04/11/2019  . Uncontrolled type 2 diabetes mellitus with insulin therapy ([place]) 04/16/2018  . Allergic rhinitis 03/06/2018  . Unilateral complete BKA, left, sequela ([place])   . Class 3 severe obesity due to excess calories with serious  comorbidity and body mass index (BMI) of 50.0 to 59.9 in adult [facility])   . AKI (acute kidney injury) ([place])   . Decreased pedal pulses 06/10/2017  . Unilateral primary osteoarthritis, right knee 10/22/2016  . Primary osteoarthritis of first carpometacarpal joint of left hand 07/30/2016  . Diabetic neuropathy ([place]) 07/14/2016  . Nausea 03/17/2016  . De Quervain's tenosynovitis, bilateral 11/01/2015  . Vitamin D deficiency 09/05/2015  . Recurrent candidiasis of vagina 09/05/2015  . Varicose veins of leg with complications 15/37/9432  . Restless leg syndrome 10/17/2014  . Chronic sinusitis 07/18/2014  . Encounter for chronic pain management 06/30/2013  . HLD (hyperlipidemia) 11/19/2012  . Chest pain 06/27/2012  . Right carpal tunnel syndrome 09/01/2011  . Bilateral knee pain 09/01/2011  . DM (diabetes mellitus) type II uncontrolled, periph vascular disorder ([place]) 05/22/2008  . Morbid obesity ([place]) 05/22/2008  . Obesity hypoventilation syndrome ([place]) 05/22/2008  . Depression 05/22/2008  . Obstructive sleep apnea 05/22/2008  . Hypertension 05/22/2008  . Asthma 05/22/2008  . GERD 05/22/2008   PCP:  [name], MD Pharmacy:   [facility] [place], [place] - [address] [place] [address] Phone: [phone] Fax: [phone]     Social Determinants of Health (SDOH) Interventions    Readmission Risk Interventions No flowsheet data found.

## 2020-06-21 NOTE — Plan of Care (Signed)

## 2020-06-21 NOTE — Care Management (Signed)
    Durable Medical Equipment  (From admission, onward)         Start     Ordered   06/21/20 1402  For home use only DME Hospital bed  Once       Question Answer Comment  Length of Need Lifetime   Patient has (list medical condition): obesity, hypoventilation   The above medical condition requires: Patient requires the ability to reposition frequently   Head must be elevated greater than: 30 degrees   Bed type Semi-electric   Support Surface: Alternating Pressure Pad and Pump      06/21/20 1402   06/21/20 1323  For home use only DME Pulse oximeter  Once        06/21/20 1322

## 2020-06-21 NOTE — Progress Notes (Addendum)
Family Medicine Teaching Service Daily Progress Note Intern Pager: [phone]  Patient name: [name] Medical record number: [id number] Date of birth: 1976/10/07 Age: 44 y.o. Gender: female  Primary Care Provider: [name], MD Consultants: Pulmonology Code Status: Full  Pt Overview and Major Events to Date:  06/07/2020- Admitted  Assessment and Plan: Belinda Banksis a 44 y.o.femalepresenting as a direct admissionwith dyspneafor acute hypoxic respiratory failure in the setting of 1 month history with worsening shortness of breath. PMH is significant forpoorly controlled type 2 diabetes, depression, OSA, hypertension, morbid obesity, OHSandleft BKA.   Acute hypoxic, hypercarbic respiratory failure Chest x-ray demonstrated mild peribronchial thickening with increasing basilar atelectasis.Patient continues to have a  dry cough. Patient consistent O2 sat 94-99% on 4L [place] during the day and 4L BiPAP at night. Will watch O2 sat on RA. -Pulmonologyconsultedand on board -R DVT U/Sperformed demonstrated no evidence of DVT and no cystic structure in the popliteal fossa, but exam was limited. -Continue oral Lasix 40 mg -[id number] -Continue prednisone 40 mg daily(will monitor glucose closely) -Oxygen therapy as needed -Continue to monitor -Wean 4L oxygen given 99% ox satduring day - 4L BiPaP at night -Tessalon Pearls -Follow up with PCPand get PFTs outpatient -Working on getting patient appropriate oxygen therapy for home use. FPTS spoke to the company regarding her oxygen machine to ensure proper setup after discharge -Ordered home health PT/OT for discharge  Atypical chest pain: Chronic, stable. Denies chest pain today. Intermittently present for the past several months, has already seen her cardiologist outpatient for this and had a low risk Lexiscan in 05/2020 suggesting against cardiac etiology. Troponin 11-13 consistently on several checks through 05/12/2020.   -AwaitingEKG results -Troponin recheck if chest pain arises again -Monitor for recurrence -Cardiology outpatient   Mild asthma: Chronic. Previously known to be well controlled, her current respiratory status for the past month is unusual for her.Worsening dyspnea started in December when she moved to her new place that is concerning for mold. Since December, dyspnea progressively worsening. Prior to December, patient only used an inhaler as needed which was a few times a year according to the patient. -Continue prednisone, duoneband breathing treatments  -Continue home Symbicort, formulary Dulera on instead  T2DM: Chronic, uncontrolled. A1c 12.7 on 06/14/2020. Following closely with her PCP and Dr. [facility] clinic to improve this. Currently takes Humulin 70/30 100U in am and 80U in pm,endorses compliance.CBG today 104. -CBGs with meals and nightly - Novolog mix 110 units at breakfast and 90 supper  -resistant sliding scale   Hypertension: Chronic, stable. BP today is129/79and147/101 on arrival. Takes Norvasc and metoprolol at home. -Continue home Norvasc and metoprolol -Continue to monitor BP  Hyperlipidemia: Chronic, stable. Most recent lipid panel from 04/10/2020 demonstrated cholesterol of 274, triglycerides 324, HDL 39 and LDL 173.Continue home Lipitor 40 mg. -Follow up with PCP at discharge  OSA: Chronic, stable. Patient wearing BiPAP as recommended by pulmonology. Previously woreCPAP at night, has not been able to do this due to new oxygen requirementof wearing nasal canulawith machine at homeand not being set up with it prior to discharge on previous hospitalization.  -Continue BiPAP -Discussed with company manufacturing oxygen machine to ensure patient has proper oxygen equipment once discharged   Morbid obesity: Chronic, stable. BMI 61. Will qualify for weight reduction surgical intervention if desired. -Follow-up with PCP outpatientto discuss  appropriate lifestyle modifications to incorporate after discharge  Chronic pain syndrome:Stable. Denies any localized or generalized pain today.Takes gabapentin and Norco as needed at home.  PMP aware reviewed and appropriate. -Continue home gabapentin and Norco10-325 q6 PRN -Flexeril discontinued   Restless leg syndrome: Chronic, stable. Home meds include ropinirole at bedtime. -Continue home med ropinirole  FEN/GI: Carb modified PPx: Lovenox  Disposition: Home  Subjective:  Patient is resting comfortably in bed with BiPAP on. No overnight events. Patient reports feeling "fine" and feels ready for discharge.  Objective: Temp:  [97.8 F (36.6 C)-98.3 F (36.8 C)] 98.1 F (36.7 C) (07/16 2328) Pulse Rate:  [79-90] 79 (07/16 2328) Resp:  [16-19] 18 (07/16 1743) BP: (125-131)/(73-81) 125/78 (07/16 2328) SpO2:  [94 %-99 %] 95 % (07/16 2328) FiO2 (%):  [40 %] 40 % (07/16 2016) Physical Exam: General: Resting comfortably. Cardiovascular: Distant heart sounds Respiratory: Clear and equal to lung auscl. bilaterally Abdomen: Normoactive bowel sounds, no pain to palpation Extremities: Pedal pulse in RLE felt with toe amputations. LLE is BKA.  Laboratory: Recent Labs  Lab 06/17/20 1730 06/18/20 1224 06/19/20 0314  WBC 8.1 7.2 8.6  HGB 11.1* 10.6* 10.4*  HCT 36.5 35.2* 34.9*  PLT 341 307 314   Recent Labs  Lab 06/17/20 1730 06/17/20 1730 06/18/20 1224 06/19/20 0314 06/20/20 0221  NA 137   < > 134* 134* 135  K 3.9   < > 4.5 5.0 4.2  CL 99   < > 99 97* 98  CO2 27   < > 22 29 29   BUN 26*   < > 28* 37* 35*  CREATININE 0.97   < > 0.89 1.18* 1.04*  CALCIUM 8.7*   < > 8.6* 8.6* 8.6*  PROT 6.9  --   --   --   --   BILITOT 0.3  --   --   --   --   ALKPHOS 91  --   --   --   --   ALT 15  --   --   --   --   AST 9*  --   --   --   --   GLUCOSE 240*   < > 372* 246* 261*   < > = values in this interval not displayed.      Imaging/Diagnostic Tests: None  new  Freida Busman, MD 06/21/2020, 2:48 AM PGY-1, [place] Intern pager: [phone], text pages welcome

## 2020-06-21 NOTE — Progress Notes (Signed)
06/21/2020 While turning patient in bed her exertion saturation is 88 to 87. Rico Sheehan RN

## 2020-06-21 NOTE — Progress Notes (Signed)
Patient placed on BIPAP so that she could rest. Vitals stable. On continuous pulse ox. RT will continue to monitor.

## 2020-06-22 DIAGNOSIS — E1140 Type 2 diabetes mellitus with diabetic neuropathy, unspecified: Secondary | ICD-10-CM | POA: Diagnosis not present

## 2020-06-22 DIAGNOSIS — J9601 Acute respiratory failure with hypoxia: Secondary | ICD-10-CM | POA: Diagnosis not present

## 2020-06-22 DIAGNOSIS — J9602 Acute respiratory failure with hypercapnia: Secondary | ICD-10-CM | POA: Diagnosis not present

## 2020-06-22 LAB — GLUCOSE, CAPILLARY
Glucose-Capillary: 312 mg/dL — ABNORMAL HIGH (ref 70–99)
Glucose-Capillary: 208 mg/dL — ABNORMAL HIGH (ref 70–99)
Glucose-Capillary: 310 mg/dL — ABNORMAL HIGH (ref 70–99)
Glucose-Capillary: 365 mg/dL — ABNORMAL HIGH (ref 70–99)
Glucose-Capillary: 326 mg/dL — ABNORMAL HIGH (ref 70–99)

## 2020-06-22 LAB — BASIC METABOLIC PANEL
Anion gap: 9 (ref 5–15)
BUN: 25 mg/dL — ABNORMAL HIGH (ref 6–20)
CO2: 27 mmol/L (ref 22–32)
Calcium: 8.5 mg/dL — ABNORMAL LOW (ref 8.9–10.3)
Chloride: 98 mmol/L (ref 98–111)
Creatinine, Ser: 0.78 mg/dL (ref 0.44–1.00)
GFR calc Af Amer: >60 mL/min (ref >60)
GFR calc non Af Amer: >60 mL/min (ref >60)
Glucose, Bld: 271 mg/dL — ABNORMAL HIGH (ref 70–99)
Potassium: 4.5 mmol/L (ref 3.5–5.1)
Sodium: 134 mmol/L — ABNORMAL LOW (ref 135–145)

## 2020-06-22 MED ORDER — GABAPENTIN 600 MG PO TABS
600.0000 mg | ORAL_TABLET | Freq: Once | ORAL | Status: AC
  Administered 2020-06-22: 600 mg via ORAL
  Filled 2020-06-22: qty 1

## 2020-06-22 MED ORDER — GABAPENTIN 600 MG PO TABS
1200.0000 mg | ORAL_TABLET | Freq: Three times a day (TID) | ORAL | Status: DC
  Administered 2020-06-22 – 2020-06-23 (×3): 1200 mg via ORAL
  Filled 2020-06-22 (×3): qty 2

## 2020-06-22 NOTE — Progress Notes (Signed)
Family Medicine Teaching Service Daily Progress Note Intern Pager: [phone]  Patient name: [name] Medical record number: [id number] Date of birth: 1976-02-20 Age: 44 y.o. Gender: female  Primary Care Provider: [name], MD Consultants: Pulmonology  Code Status: Full Code  Pt Overview and Major Events to Date:  06/07/2020- Admitted  Assessment and Plan: Belinda Banksis a 44 y.o.femalepresenting as a direct admissionwith dyspneafor acute hypoxic respiratory failure in the setting of 1 month history with worsening shortness of breath. PMH is significant forpoorly controlled type 2 diabetes, depression, OSA, hypertension, morbid obesity, OHSandleft BKA.  Acute hypoxic, hypercarbic respiratory failure Chest x-ray demonstrated mild peribronchial thickening with increasing basilar atelectasis.Patient continues to have a  dry cough. Patient satting on 91-94% while I was in the room , only uses oxygen with exertion. -Pulmonologyconsultedand on board -R DVT U/Sperformed demonstrated no evidence of DVT and no cystic structure in the popliteal fossa, but exam was limited. -Continueoral Lasix 40 mg -[id number] -Continue prednisone 40 mg daily(monitor glucose closely) -Oxygen therapy as needed -Continue to monitor -telemetry discontinued  -4L oxygen used only with exertion  - 4L BiPaP at night -Tessalon Pearls -Follow up with PCPand get PFTs outpatient -Working on getting patient appropriate oxygen therapy for home use. FPTS spoke to the company regarding her oxygen machine to ensure proper setup after discharge -Ordered home health PT/OT for discharge  Atypical chest pain: Chronic, stable. Denies chest pain today.Intermittently present for the past several months, has already seen her cardiologist outpatient for this and had a low risk Lexiscan in 05/2020 suggesting against cardiac etiology. Troponin 11-13 consistently on several checks through 05/12/2020.   -AwaitingEKGresults -Troponin recheck if chest pain arises again -Monitor for recurrence -Cardiology outpatient  Mild asthma: Chronic. Previously known to be well controlled, her current respiratory status for the past month is unusual for her.Worsening dyspnea started in December when she moved to her new place that is concerning for mold. Since December, dyspnea progressively worsening. Prior to December, patient only used an inhaler as needed which was a few times a year according to the patient. -Continue prednisone, duoneband breathing treatments  -Continue home Symbicort, formulary Dulera on instead  T2DM: Chronic, uncontrolled. Most recent CBG 255 from 7/17.  A1c 12.7 on 06/14/2020. Following closely with her PCP and Dr. [facility] clinic to improve this. Currently takes Humulin 70/30 100U in am and 80U in pm,endorses compliance at home. -CBGs with meals and nightly - Novolog mix 110 units at breakfast and 90 supper -resistant sliding scale  -Continue to follow up with PCP after discharge   Hypertension: Chronic, stable. BP today is152/87(high BP today likely due to LE pain) and147/101 on arrival. Takes Norvasc and metoprolol at home.  -Continue home Norvasc and metoprolol -Continue to monitor BP  Hyperlipidemia: Chronic, stable. Most recent lipid panel from 04/10/2020 demonstrated cholesterol of 274, triglycerides 324, HDL 39 and LDL 173.Continue home Lipitor 40 mg. -Follow up with PCP at discharge  OSA: Chronic, stable. Patient wearing BiPAP as recommended by pulmonology. Previously woreCPAP at night, has not been able to do this due to new oxygen requirementof wearing nasal canulawith machine at homeand not being set up with it prior todischarge on previous hospitalization. -Continue BiPAP -Discussed with company manufacturing oxygen machine last week to ensure patient has proper oxygen equipment once discharged  Morbid obesity: Chronic,  stable. BMI 61. Will qualify for weight reduction surgical intervention if desired. -Follow-up with PCP outpatientto discuss appropriate lifestyle modifications to incorporate after discharge  Chronic pain  syndrome:Stable. Admits to lower extremity pain bilaterally today.Takes gabapentin and Norco as needed at home. PMP aware reviewed and appropriate. -Gabapentin increased to home dose -Continue Norco10-325 q6 PRN -Flexerildiscontinued  Restless leg syndrome: Chronic, stable. Home meds includeropinirole at bedtime. -Continue home med ropinirole  FEN/GI: carb modified  PPx: Lovenox  Disposition: [facility]  Subjective:  Patient seen and examined at bedside. She said that she had trouble sleeping last night because she was anxious so did not use the BiPAP the entire night. She reports improved dyspnea and is currently satting around 91-94 on room air during the encounter. Patient only uses oxygen if needed for exertion. She denies chest pain, nausea, vomiting and dizziness. She admits to LE pain bilaterally, gabapentin was increased to her home dose. Patient reports eating well while here in the hospital.   Objective: Temp:  [98.3 F (36.8 C)-98.8 F (37.1 C)] 98.3 F (36.8 C) (07/17 2144) Pulse Rate:  [89-97] 89 (07/17 2258) Resp:  [18-24] 20 (07/17 2258) BP: (133-135)/(67-70) 133/67 (07/17 2144) SpO2:  [91 %-100 %] 98 % (07/17 2258) FiO2 (%):  [40 %] 40 % (07/17 0856) Physical Exam: General: Patient in no acute distress, resting comfortably in bed. Cardiovascular: regular rate and rhythm, no murmurs or gallops noted  Respiratory: lungs clear to auscultation bilaterally, no increased work of breathing noted  Abdomen: nontender, active bowel sounds  Extremities: 2+ pitting edema right LE, left below knee amputation, distal pusle intact on the right LE Psych: mood appropriate, no agitation noted   Laboratory: Recent Labs  Lab 06/18/20 1224  06/19/20 0314 06/21/20 0341  WBC 7.2 8.6 11.7*  HGB 10.6* 10.4* 10.3*  HCT 35.2* 34.9* 33.5*  PLT 307 314 298   Recent Labs  Lab 06/17/20 1730 06/18/20 1224 06/19/20 0314 06/20/20 0221 06/21/20 0341  NA 137   < > 134* 135 138  K 3.9   < > 5.0 4.2 4.2  CL 99   < > 97* 98 101  CO2 27   < > 29 29 28   BUN 26*   < > 37* 35* 35*  CREATININE 0.97   < > 1.18* 1.04* 0.83  CALCIUM 8.7*   < > 8.6* 8.6* 8.5*  PROT 6.9  --   --   --   --   BILITOT 0.3  --   --   --   --   ALKPHOS 91  --   --   --   --   ALT 15  --   --   --   --   AST 9*  --   --   --   --   GLUCOSE 240*   < > 246* 261* 104*   < > = values in this interval not displayed.      Imaging/Diagnostic Tests: No results found.  Donney Dice, DO 06/22/2020, 7:33 AM PGY-1, [place] Intern pager: [phone], text pages welcome

## 2020-06-23 ENCOUNTER — Other Ambulatory Visit: Payer: Self-pay | Admitting: Family Medicine

## 2020-06-23 ENCOUNTER — Telehealth: Payer: Medicaid Other

## 2020-06-23 ENCOUNTER — Other Ambulatory Visit: Payer: Self-pay

## 2020-06-23 ENCOUNTER — Ambulatory Visit: Payer: Self-pay

## 2020-06-23 DIAGNOSIS — E1140 Type 2 diabetes mellitus with diabetic neuropathy, unspecified: Secondary | ICD-10-CM | POA: Diagnosis not present

## 2020-06-23 LAB — CBC
HCT: 35.4 % — ABNORMAL LOW (ref 36.0–46.0)
Hemoglobin: 10.8 g/dL — ABNORMAL LOW (ref 12.0–15.0)
MCH: 28.6 pg (ref 26.0–34.0)
MCHC: 30.5 g/dL (ref 30.0–36.0)
MCV: 93.9 fL (ref 80.0–100.0)
Platelets: 352 10*3/uL (ref 150–400)
RBC: 3.77 MIL/uL — ABNORMAL LOW (ref 3.87–5.11)
RDW: 13.5 % (ref 11.5–15.5)
WBC: 14.2 10*3/uL — ABNORMAL HIGH (ref 4.0–10.5)
nRBC: 0 % (ref 0.0–0.2)

## 2020-06-23 LAB — BASIC METABOLIC PANEL
Anion gap: 10 (ref 5–15)
BUN: 32 mg/dL — ABNORMAL HIGH (ref 6–20)
CO2: 30 mmol/L (ref 22–32)
Calcium: 9 mg/dL (ref 8.9–10.3)
Chloride: 100 mmol/L (ref 98–111)
Creatinine, Ser: 0.85 mg/dL (ref 0.44–1.00)
GFR calc Af Amer: >60 mL/min (ref >60)
GFR calc non Af Amer: >60 mL/min (ref >60)
Glucose, Bld: 222 mg/dL — ABNORMAL HIGH (ref 70–99)
Potassium: 4 mmol/L (ref 3.5–5.1)
Sodium: 140 mmol/L (ref 135–145)

## 2020-06-23 LAB — GLUCOSE, CAPILLARY
Glucose-Capillary: 236 mg/dL — ABNORMAL HIGH (ref 70–99)
Glucose-Capillary: 180 mg/dL — ABNORMAL HIGH (ref 70–99)

## 2020-06-23 MED ORDER — HYDROCODONE-ACETAMINOPHEN 10-325 MG PO TABS: ORAL_TABLET | ORAL | 0 refills | Status: DC

## 2020-06-23 MED ORDER — INSULIN ISOPHANE & REGULAR (HUMAN 70-30)100 UNIT/ML KWIKPEN: PEN_INJECTOR | SUBCUTANEOUS | 0 refills | Status: DC

## 2020-06-23 MED ORDER — SALINE SPRAY 0.65 % NA SOLN: 1.0000 | NASAL | 0 refills | Status: DC | PRN

## 2020-06-23 MED ORDER — FUROSEMIDE 40 MG PO TABS: 40.0000 mg | ORAL_TABLET | Freq: Every day | ORAL | 0 refills | Status: DC

## 2020-06-23 NOTE — Progress Notes (Signed)
Family Medicine Teaching Service Daily Progress Note Intern Pager: [phone]  Patient name: [name] Medical record number: [id number] Date of birth: 1976-08-26 Age: 44 y.o. Gender: female  Primary Care Provider: [name], MD Consultants: Pulmonology  Code Status: Full Code  Pt Overview and Major Events to Date:  Admitted 06/07/2020  Assessment and Plan: [name] a 44 y.o.femalepresenting as a direct admissionwith dyspneafor acute hypoxic respiratory failure in the setting of 1 month history with worsening shortness of breath. PMH is significant forpoorly controlled type 2 diabetes, depression, OSA, hypertension, morbid obesity, OHSandleft BKA.  Acute hypoxic, hypercarbic respiratory failure Chest x-ray demonstrated mild peribronchial thickening with increasing basilar atelectasis.Patient continues to have adry cough. Patient satting on 93% on room air while I was in the room, only uses oxygen with exertion. -Pulmonologyconsultedand on board -R DVT U/Sperformed demonstrated no evidence of DVT and no cystic structure in the popliteal fossa, but exam was limited. -Continueoral Lasix 40 mg -[id number] -Continue to monitor -telemetry discontinued  -4L oxygen used only with exertion  - 4L BiPaP at night -TessalonPearls -Follow up with PCPand get PFTs outpatient -Working on getting patient appropriate oxygen therapy for home use.FPTS spoke to the company regarding her oxygen machine to ensure proper setup after discharge -Ensure patient goes home with appropriate DME, BiPAP -Ordered home health PT/OT for discharge  Atypical chest pain: Chronic, stable. Denies chest pain today.Intermittently present for the past several months, has already seen her cardiologist outpatient for this and had a low risk Lexiscan in 05/2020 suggesting against cardiac etiology. Troponin 11-13 consistently on several checks through 05/12/2020.  -AwaitingEKGresults -Troponin  recheck if chest pain arises again -Monitor for recurrence -Cardiology outpatient  Mild asthma: Chronic. Previously known to be well controlled, her current respiratory status for the past month is unusual for her.Worsening dyspnea started in December when she moved to her new place that is concerning for mold. Since December, dyspnea progressively worsening. Prior to December, patient only used an inhaler as needed which was a few times a year according to the patient. -Continue prednisone, duoneband breathing treatments  -Continue home Symbicort, formulary Dulera on instead  T2DM: Chronic, uncontrolled. CBG 236 today.  A1c 12.7 on 06/14/2020. Following closely with her PCP and Dr. [facility] clinic to improve this. Currently takes Humulin 70/30 100U in am and 80U in pm,endorses compliance at home. -CBGs with meals and nightly - Novologmix110 units at breakfast and 90 supper -resistant sliding scale  -Continue to follow up with PCP after discharge   Hypertension: Chronic, stable. BP today is144/95 and147/101 on arrival. Takes Norvasc and metoprolol at home.  -Continue home Norvasc and metoprolol -Continue to monitor BP  Hyperlipidemia: Chronic, stable. Most recent lipid panel from 04/10/2020 demonstrated cholesterol of 274, triglycerides 324, HDL 39 and LDL 173.Continue home Lipitor 40 mg. -Follow up with PCP at discharge  OSA: Chronic, stable. Patient wearing BiPAP as recommended by pulmonology. Previously woreCPAP at night, has not been able to do this due to new oxygen requirementof wearing nasal canulawith machine at homeand not being set up with it prior todischarge on previous hospitalization. -Continue BiPAP -Discussed with company manufacturing oxygen machine last week to ensure patient has proper oxygen equipment once discharged -BiPAP being delivered home  Morbid obesity: Chronic, stable. BMI 61. Will qualify for weight reduction surgical  intervention if desired. -Follow-up with PCP outpatientto discuss appropriate lifestyle modifications to incorporate after discharge  Chronic pain syndrome:Stable. Denies any LE pain, likely resolved from gabapentin home dose.Takes gabapentin and  Norco as needed at home. PMP aware reviewed and appropriate. -Gabapentin home dose  -Continue Norco10-325 q6 PRN -Flexerildiscontinued  Restless leg syndrome: Chronic, stable. Home meds includeropinirole at bedtime. -Continue home med ropinirole  FEN/GI: carb modified  PPx: Lovenox  Disposition: anticipate discharged home with home health   Subjective:  Patient seen and examined at bedside, reports improved dyspnea. Ox satting on room air at 93%, only uses oxygen with exertion. Denies chest pain, nasuea and vomiting. Has been eating well. Medically stable to go home, just need to ensure she has proper DME, including BiPAP. Denies any new complaints.   Objective: Temp:  [97.8 F (36.6 C)-98.3 F (36.8 C)] 97.9 F (36.6 C) (07/18 2222) Pulse Rate:  [84-98] 98 (07/18 2320) Resp:  [13-20] 20 (07/18 2320) BP: (137-152)/(87-92) 142/90 (07/18 2222) SpO2:  [91 %-98 %] 98 % (07/18 2320) FiO2 (%):  [36 %-40 %] 36 % (07/18 1328) Physical Exam: General: Patient in no acute distress Cardiovascular: regular rate and rhythm, no murmurs or gallops noted  Respiratory: lungs clear to auscultation bilaterally  Abdomen: nontender on palpation, active bowel sounds present  Extremities: distal pulses intact on the right LE, 2+ pitting edema right LE, left below knee amputation Psych: mood appropriate, no agitation noted   Laboratory: Recent Labs  Lab 06/18/20 1224 06/19/20 0314 06/21/20 0341  WBC 7.2 8.6 11.7*  HGB 10.6* 10.4* 10.3*  HCT 35.2* 34.9* 33.5*  PLT 307 314 298   Recent Labs  Lab 06/17/20 1730 06/18/20 1224 06/20/20 0221 06/21/20 0341 06/22/20 1335  NA 137   < > 135 138 134*  K 3.9   < > 4.2 4.2 4.5  CL 99   < >  98 101 98  CO2 27   < > 29 28 27   BUN 26*   < > 35* 35* 25*  CREATININE 0.97   < > 1.04* 0.83 0.78  CALCIUM 8.7*   < > 8.6* 8.5* 8.5*  PROT 6.9  --   --   --   --   BILITOT 0.3  --   --   --   --   ALKPHOS 91  --   --   --   --   ALT 15  --   --   --   --   AST 9*  --   --   --   --   GLUCOSE 240*   < > 261* 104* 271*   < > = values in this interval not displayed.      Imaging/Diagnostic Tests: No results found.  Donney Dice, DO 06/23/2020, 6:04 AM PGY-1, [place] Intern pager: [phone], text pages welcome

## 2020-06-23 NOTE — Discharge Summary (Addendum)
[facility] Discharge Summary  Patient name: [name] Medical record number: [id number] Date of birth: 1976/11/05 Age: 44 y.o. Gender: female Date of Admission: 06/17/2020  Date of Discharge: 06/23/2020 Admitting Physician: Patriciaann Clan, DO  Primary Care Provider: [name], MD Consultants: Pulmonology   Indication for Hospitalization: Dyspnea  Discharge Diagnoses/Problem List:  Acute on chronic hypoxic, hypercarbic respiratory failure  Atypical chest pain Mild asthma Type 2 DM, uncontrolled  Hypertension Hyperlipidemia OSA Morbid obesity  Chronic pain syndrome Restless leg syndrome  Disposition: discharged home with home health  Discharge Condition: stable  Discharge Exam:  Blood pressure (!) 144/95, pulse 76, temperature 98.6 F (37 C), temperature source Oral, resp. rate 18, SpO2 99 %. General: Patient in no acute distress Cardiovascular: regular rate and rhythm, no murmurs or gallops noted  Respiratory: lungs clear to auscultation bilaterally  Abdomen: nontender on palpation, active bowel sounds present  Extremities: distal pulses intact on the right LE, 2+ pitting edema right LE, left below knee amputation Psych: mood appropriate, no agitation noted   Brief Hospital Course:  [name] is a 44 y.o. female who presented as a direct admission per request of her PCP for worsening dyspnea and new substantial oxygen requirement. PMH is significant for poorly controlled type 2 diabetes, depression, OSA, hypertension, morbid obesity, OHS and left BKA.   Acute hypoxic hypercarbic respiratory failure in setting of OSA/OHS and RSV:  Patient presented with worsening dyspnea over the past month. She had a prior hospitalization at [facility] where she discharged just a few days before this admission. She was sent home on 4L oxygen, previously had done well on RA.  Fortunately had already had work-up during previous  hospitalization revealing no central PE on CTA and preserved EF on echo. Recent lexiscan in 05/2020 through her cardiologist unremarkable. Upon admission, she had mild expiratory wheezing and with ABG demonstrating respiratory acidosis with chronic hypercapnia.  DVT U/S without evidence of DVT. RVP positive for RSV. Pulmonology was consulted and recommended trialing nightly BiPAP, in addition to home asthma regimen, and considering discussion of elective tracheostomy in the future.  During this time, she was also trialed with Lasix diuresis (with minimal change in respiratory status) and prednisone course.  Over the course of her stay with the help of nightly BiPAP, she slowly weaned off of oxygen maintaining sats in low 90s and by discharge only required [place] with exertion.  Additionally discussed decreasing home chronic Norco with PCP and discontinuing flexeril. She had home BiPAP and pulse ox delivered prior to discharge.   Issues for Follow Up:  1. Monitor O2 with PCP, continue to monitor current treatment regimen and nightly BiPAP use. Consider also obtaining PFTs in the outpatient setting.  2. Follow up with Dr. Loanne Drilling, pulmonology, on 8/16.  3. Monitor glucose.  Increased home regimen of 70/30 to 110U in am and 90U in pm (from 100/80 at home), please reassess as appropriate as she did receive a prednisone course during this time.   4. Continue to follow up with PCP regarding other chronic conditions including HTN, chronic pain syndrome and morbid obesity. Encourage implementation of healthy lifestyle modifications for preventative measures and improvement of current conditions.    Significant Procedures:  No significant procedures  Significant Labs and Imaging:  Recent Labs  Lab 06/21/20 0341 06/23/20 1349  WBC 11.7* 14.2*  HGB 10.3* 10.8*  HCT 33.5* 35.4*  PLT 298 352   Recent Labs  Lab 06/21/20 0341 06/21/20  6144 06/22/20 1335 06/23/20 1349  NA 138  --  134* 140  K 4.2   < > 4.5  4.0  CL 101  --  98 100  CO2 28  --  27 30  GLUCOSE 104*  --  271* 222*  BUN 35*  --  25* 32*  CREATININE 0.83  --  0.78 0.85  CALCIUM 8.5*  --  8.5* 9.0   < > = values in this interval not displayed.      Results/Tests Pending at Time of Discharge:  No results found.  Discharge Medications:  Allergies as of 06/23/2020      Reactions   Cefepime Other (See Comments)   AKI, see records from [facility] hospitalization in January 2020.     Kiwi Extract Shortness Of Breath, Swelling   Morphine And Related Nausea And Vomiting   Trental [pentoxifylline] Nausea And Vomiting   Nubain [nalbuphine Hcl] Other (See Comments)   "FEELS LIKE SOMETHING CRAWLING ON ME"      Medication List    STOP taking these medications   cyclobenzaprine 5 MG tablet Commonly known as: FLEXERIL     TAKE these medications   Accu-Chek Guide test strip Generic drug: glucose blood USE T0 CHECK BLOOD SUGAR 3 TIMES DAILY Notes to patient: To check blood sugars   Accu-Chek Softclix Lancets lancets Use to check blood glucose four times daily Notes to patient: To check blood sugars   accu-chek multiclix lancets 1 each by Other route as needed for other. Use as instructed Notes to patient: To check blood sugars   amLODipine 10 MG tablet Commonly known as: NORVASC Take 1 tablet (10 mg total) by mouth daily. Notes to patient: To lower blood pressure   atorvastatin 40 MG tablet Commonly known as: LIPITOR Take 1 tablet (40 mg total) by mouth daily. Notes to patient: To lower cholesterol   budesonide-formoterol 160-4.5 MCG/ACT inhaler Commonly known as: Symbicort Inhale 2 puffs into the lungs 2 (two) times daily. Notes to patient: To improve breathing   Cholecalciferol 25 MCG (1000 UT) tablet Take 1 tablet (1,000 Units total) by mouth daily. Notes to patient: Vitamin D supplement   Dexcom G6 Sensor Misc Inject 1 applicator into the skin as directed. Change sensor every 10 days. Notes to patient: To  check blood sugars   Dexcom G6 Transmitter Misc Inject 1 Device into the skin as directed. Reuse 8 times with sensor changes. Notes to patient: To check blood sugars   gabapentin 600 MG tablet Commonly known as: NEURONTIN TAKE 2 TABLETS BY MOUTH 3 TIMES DAILY Notes to patient: To treat nerve pain   insulin isophane & regular human (70-30) 100 UNIT/ML KwikPen Commonly known as: HUMULIN 70/30 MIX Inject 110 Units into the skin daily with breakfast AND 90 Units daily with supper. What changed: See the new instructions. Notes to patient: **DOSE INCREASE** To lower blood sugar   metoprolol succinate 25 MG 24 hr tablet Commonly known as: Toprol XL Take 1 tablet (25 mg total) by mouth daily. Notes to patient: To lower heart rate   Misc. Devices Misc Shrinkers and liners for prosthesis   Pulse Oximeter For Finger Misc Use as needed to check pulse oxymetry   pantoprazole 40 MG tablet Commonly known as: PROTONIX Take 1 tablet (40 mg total) by mouth daily. Notes to patient: To lower stomach acid   Proventil HFA 108 (90 Base) MCG/ACT inhaler Generic drug: albuterol Inhale 1-2 puffs into the lungs every 6 (six) hours as needed  for wheezing or shortness of breath. What changed: See the new instructions. Notes to patient: For shortness of breath   rOPINIRole 1 MG tablet Commonly known as: Requip Take 1 tablet (1 mg total) by mouth at bedtime. Notes to patient: For restless legs   sodium chloride 0.65 % Soln nasal spray Commonly known as: OCEAN Place 1 spray into both nostrils as needed for congestion. Notes to patient: **NEW** For nasal congestion   Sure Comfort Pen Needles 32G X 4 MM Misc Generic drug: Insulin Pen Needle Use to inject insulin as indicated Notes to patient: To check blood sugars   Victoza 18 MG/3ML Sopn Generic drug: liraglutide inject 0.6 MG SUBCUTANEOUSLY ONCE A DAY FOR 1 WEEK, THEN INCREASE TO 1.2 MG ONCE daily max 1.8 MG What changed: See the new  instructions. Notes to patient: To lower blood sugar     ASK your doctor about these medications   HYDROcodone-acetaminophen 10-325 MG tablet Commonly known as: [place] 1 TABLET BY MOUTH EVERY EIGHT HOURS AS NEEDED Ask about: Which instructions should I use?       Discharge Instructions: Please refer to Patient Instructions section of EMR for full details.  Patient was counseled important signs and symptoms that should prompt return to medical care, changes in medications, dietary instructions, activity restrictions, and follow up appointments.   Follow-Up Appointments:  Follow-up Information    [facility]. Go on 06/27/2020.   Why: 11:00 am for hospital follow up.  Contact information: [place] [phone]              Donney Dice, DO 06/27/2020, 5:47 PM PGY-1, [place] Upper-Level Resident Addendum   My edits for correction/addition/clarification are added. Please see also any attending notes.    Patriciaann Clan, DO  Family Medicine PGY-3

## 2020-06-23 NOTE — Telephone Encounter (Signed)
Patient calls nurse line from hospital room stating that she needs her hydrocodone to be sent in from Dr. [name]. Dr. Erin Hearing sent over a rx on 06/11/20. Patient states that rx has to be resent by Dr. [name] as she is in a medicaid program that only allows pain medications to be sent in by PCP. Called pharmacist, and spoke with Cecille Rubin, cancelled rx from 7/7 and sending refill request to Dr. [name].  Forwarding to Dr. Cheron Every, RN

## 2020-06-23 NOTE — Chronic Care Management (AMB) (Signed)
   RN Case Manager Care Management Admission Note 06/23/2020   RNCM notified that [name]  MRN: [id number] DOB: 09-27-1976 was admitted to the hospital for 06/17/20 for Acute Respiratory failure with hypoxia and hypercarbia by [facility] Nurse Sheran Spine.   Plan:  RNCM will monitor the patients progress  Belinda Arms RN, BSN, [facility] Care Management Coordinator [place] Phone: [phone] Fax: [phone]

## 2020-06-23 NOTE — TOC Transition Note (Signed)
Transition of Care [facility]) - CM/SW Discharge Note   Patient Details  Name: [name] MRN: [id number] Date of Birth: 02/12/1976  [facility] CM/SW Contact:  Arvella Merles, [place] Phone Number: 06/23/2020, 4:20 PM   Clinical Narrative:    CSW attempted to meet with patient but was unable to at the time due to the NIV machine being delivered and setup with patient by team. CSW agreed to return to assist patient with any discharge planning needs.  CSW returned to room and was informed by RN that patient already discharged. Per RNCM note on 7/17, patient agreed to continue OP therapies once home.      Barriers to Discharge: Continued Medical Work up   Patient Goals and CMS Choice Patient states their goals for this hospitalization and ongoing recovery are:: to go home CMS Medicare.gov Compare Post Acute Care list provided to:: Patient Choice offered to / list presented to : Patient  Discharge Placement                       Discharge Plan and Services                DME Arranged: NIV, Oxygen, Hospital bed DME Agency: [employer] Date DME Agency Contacted: 06/21/20 Time DME Agency Contacted: 61 Representative spoke with at DME Agency: Thedore Mins and Bertrum Sol            Social Determinants of Health ([place]) Interventions     Readmission Risk Interventions No flowsheet data found.

## 2020-06-23 NOTE — Discharge Instructions (Signed)
Dear [name],   Thank you so much for allowing [place] to be part of your care!  You were admitted to [facility] for worsening shortness of breath, while you were here you were found to have RSV and the changes in your carbon dioxide requiring BiPAP at night.  Fortunately with these changes, your breathing and oxygen requirement significantly improved.     POST-HOSPITAL & CARE INSTRUCTIONS 1. Make sure you use your oxygen with any exertion and BiPAP at night. 2. Please let PCP/Specialists know of any changes that were made.  3. We increased your diabetes medication while you are here, please make sure you continue to watch her sugars and bring a journal in for your visits. 4. Please see medications section of this packet for any medication changes.  5. See your follow-up appointments below.  DOCTOR'S APPOINTMENT & FOLLOW UP CARE INSTRUCTIONS  Future Appointments  Date Time Provider [place]  06/23/2020  2:30 PM [facility]-CCM-CASE MANAGER [facility] [place]  06/27/2020 11:00 AM Delora Fuel, MD [facility]  07/10/2020 11:10 AM [name], MD [facility]  07/21/2020  1:30 PM Margaretha Seeds, MD [facility]-PULCARE None    RETURN PRECAUTIONS: If you have worsening shortness of breath, chest pain, or significant swelling.  Take care and be well!  [facility]  [place], [place] [address] [phone]

## 2020-06-25 ENCOUNTER — Encounter: Payer: Self-pay | Admitting: Rehabilitation

## 2020-06-25 ENCOUNTER — Telehealth: Payer: Self-pay

## 2020-06-25 ENCOUNTER — Encounter: Payer: Medicaid Other | Admitting: Rehabilitation

## 2020-06-25 ENCOUNTER — Telehealth: Payer: Self-pay | Admitting: Family Medicine

## 2020-06-25 DIAGNOSIS — E1140 Type 2 diabetes mellitus with diabetic neuropathy, unspecified: Secondary | ICD-10-CM | POA: Diagnosis not present

## 2020-06-25 DIAGNOSIS — J9601 Acute respiratory failure with hypoxia: Secondary | ICD-10-CM

## 2020-06-25 IMAGING — CT CT PELVIS WITH CONTRAST
2 of 6 series · 14 of 46 positions shown, 18 images · IV contrast (APPLIED)
Comparison: None.

CLINICAL DATA: Left buttock abscess

EXAM:
CT PELVIS WITH CONTRAST
TECHNIQUE: Multidetector CT imaging of the pelvis was performed using the
standard protocol following the bolus administration of intravenous
contrast.
CONTRAST:  100mL [id number]

[Series 5: abdomen 3.0 mpr cor · coronal · 0.71mm/px · 3 of 135 slices shown]
[im 34/135  soft-tissue]
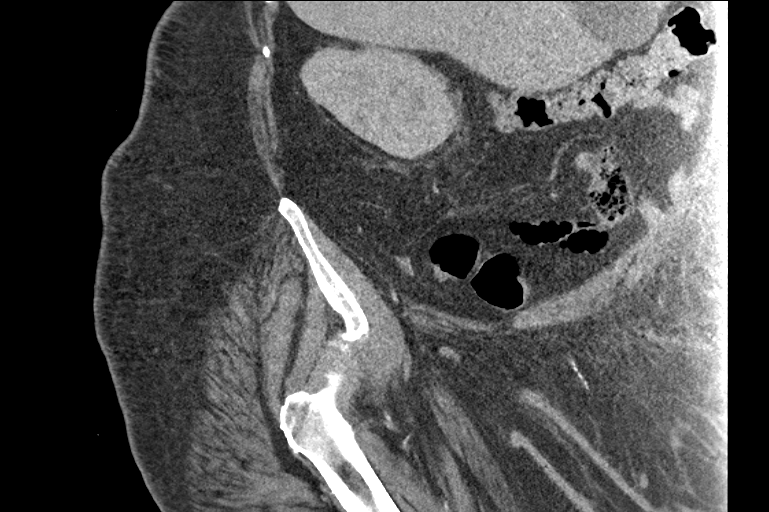
[im 68/135  soft-tissue]
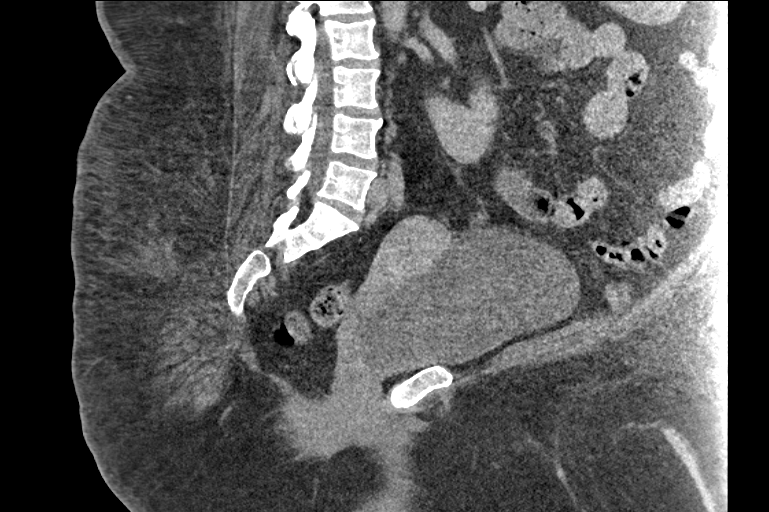
[im 101/135  soft-tissue]
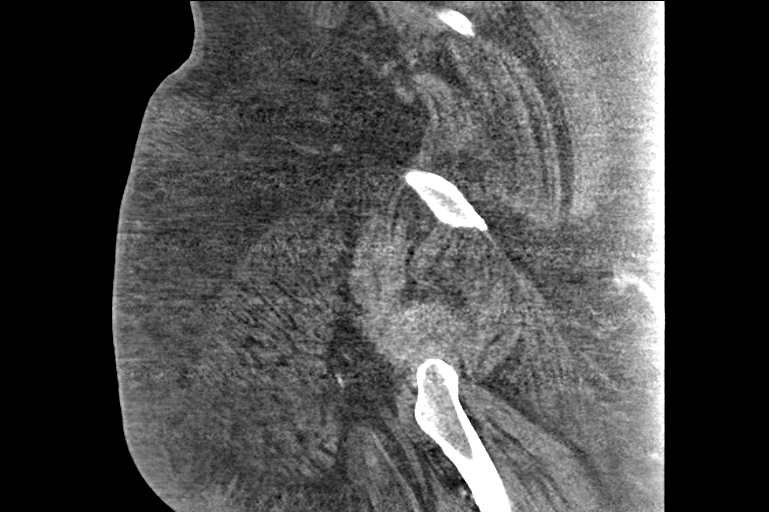

[Series 9: abdomen 5.0 · axial · 0.93mm/px · z∈[+556,+976]mm · 11 of 96 slices shown, 15 images]
[im 6/96  soft-tissue]
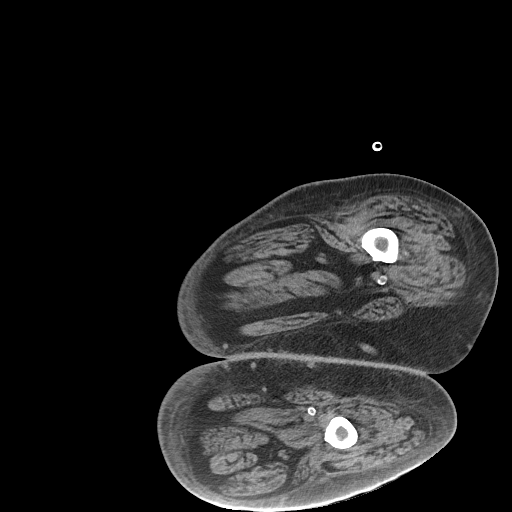
[im 6/96  bone]
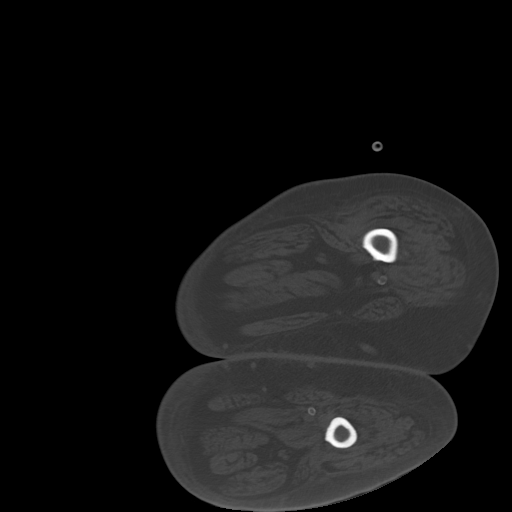
[im 17/96  soft-tissue]
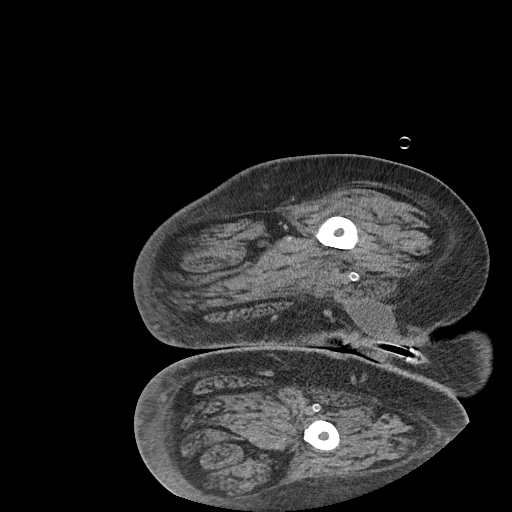
[im 28/96  soft-tissue]
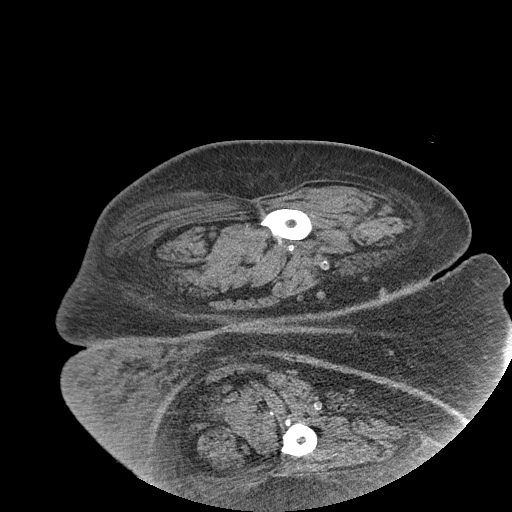
[im 40/96  soft-tissue]
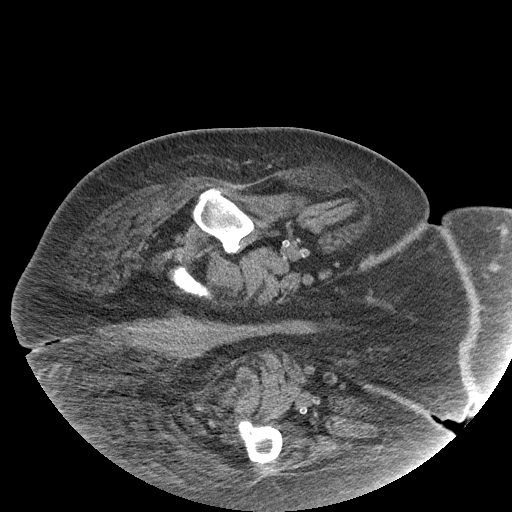
[im 51/96  soft-tissue]
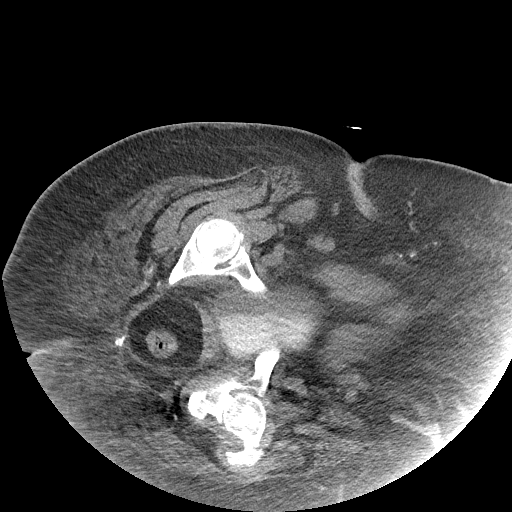
[im 56/96  soft-tissue]
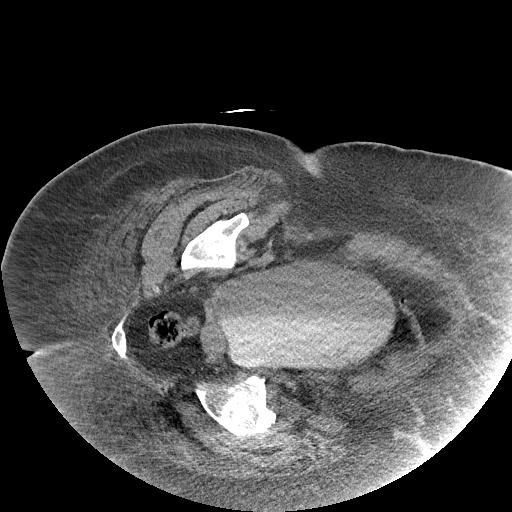
[im 68/96  soft-tissue]
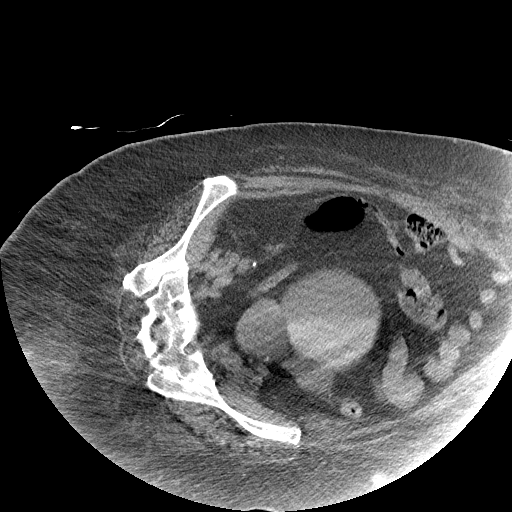
[im 73/96  lung]
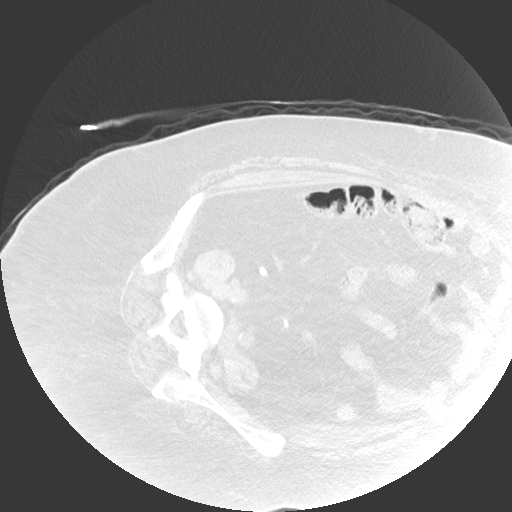
[im 79/96  soft-tissue]
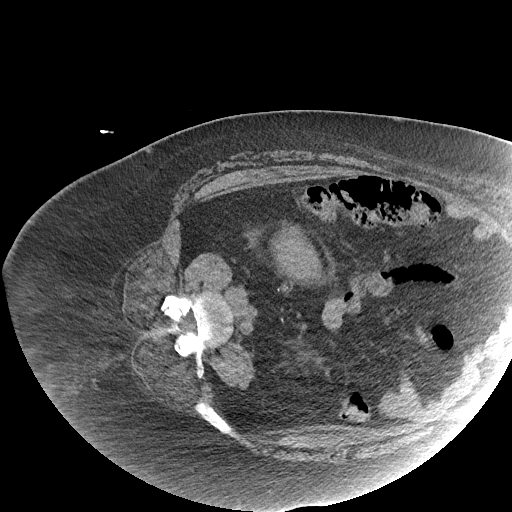
[im 79/96  lung]
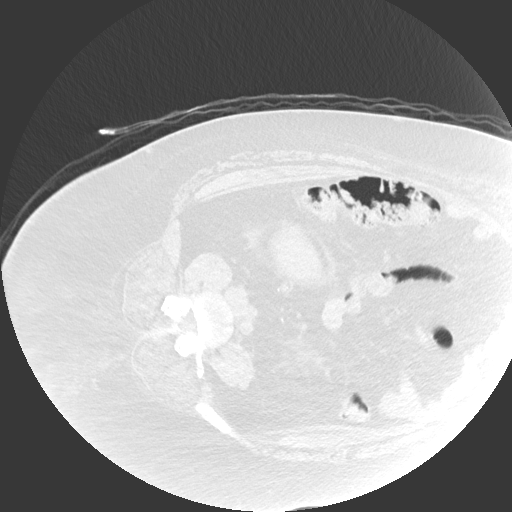
[im 84/96  lung]
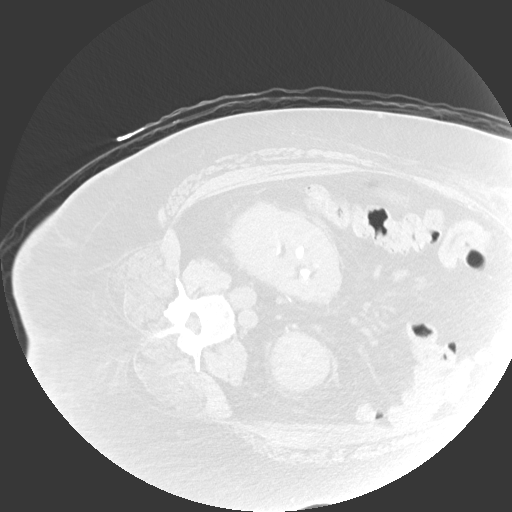
[im 90/96  soft-tissue]
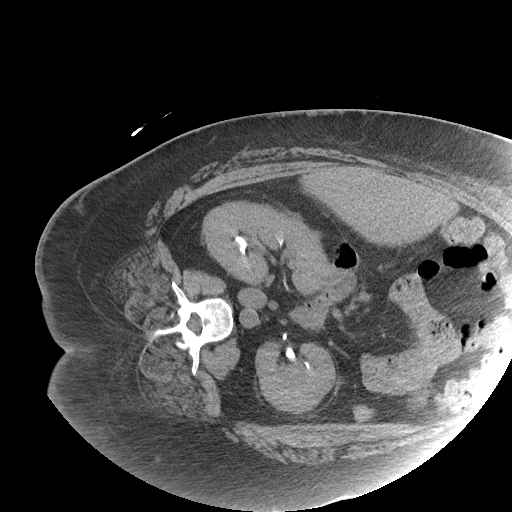
[im 90/96  lung]
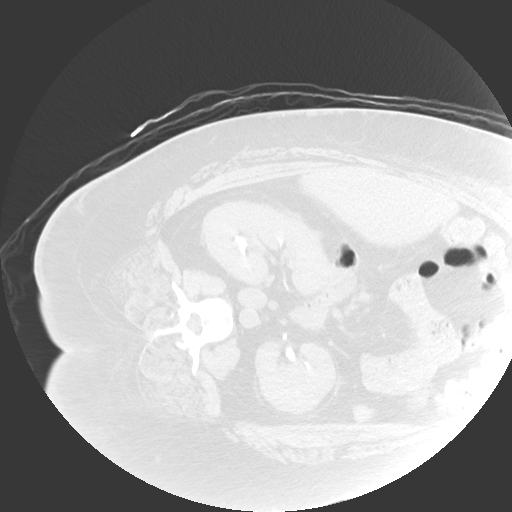
[im 90/96  bone]
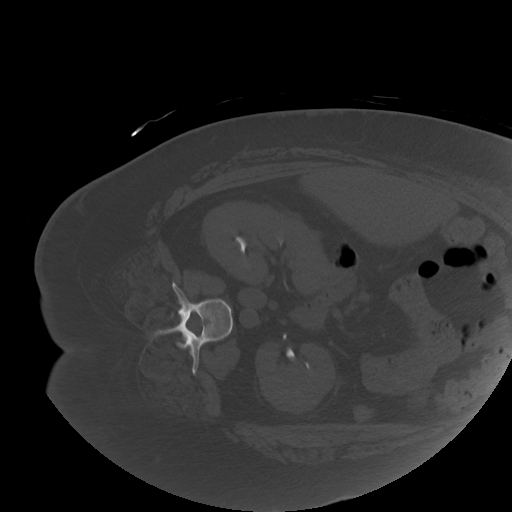

[14 of 46 positions shown; findings below may reference images not displayed]

FINDINGS: Urinary Tract: Bladder is well distended. Visualized kidneys are
unremarkable.

Bowel: No obstructive or inflammatory changes are seen. The appendix
is within normal limits.

Vascular/Lymphatic: No vascular abnormality is noted. Scattered
small inguinal lymph nodes are noted likely reactive in nature given
the patient's clinical history. No retroperitoneal adenopathy is
seen.

Reproductive:  No mass or other significant abnormality

Other: Considerable inflammatory changes are noted in the left
buttock with multifocal fluid attenuation areas. No large confluent
abscess is identified. Associated soft tissue swelling is noted
along the posterior aspect of the left thigh.

Musculoskeletal: No acute bony abnormality is noted.
IMPRESSION: Inflammatory changes in the left buttock without focal confluent
abscess formation.

Mild reactive adenopathy in the inguinal region is noted left
greater than right.

## 2020-06-25 MED ORDER — IPRATROPIUM-ALBUTEROL 0.5-2.5 (3) MG/3ML IN SOLN: 3.0000 mL | RESPIRATORY_TRACT | 5 refills | Status: DC | PRN

## 2020-06-25 NOTE — Telephone Encounter (Signed)
Patient calls nurse line stating that she needs a DME order for nebulizer for home therapy. Patient states that the Duo Neb treatments were very helpful in the hospital and would like to continue at home. Patient currently uses [facility] for DME needs.   If appropriate, please place DME order and send message to "RN Team" when complete, so we can begin processing order.   To PCP  Talbot Grumbling, RN

## 2020-06-25 NOTE — Telephone Encounter (Signed)
**  After Hours/ Emergency Line Call**  Received a call to report that [name] has been having increased lower extremity edema since discharging on 7/19.  Endorsing "a little" shortness of breath, but mostly complaining of increased leg swelling bilaterally and "holding onto fluid."  Denying chest pain and extreme shortness of breath.  Does not have pulse ox machine with her at present.  Recommended that with shortness of breath, she should be seen, but then patient stated that she didn't feel that it was bad enough to come to ED and wanted to stay home.  She was speaking in complete sentences without evidence of respiratory distress, therefore this is likely okay.  She states that she has not been peeing a lot with Lasix 40mg  QD.  Advised to take two tablets of Lasix 40mg  tonight.  Attempted to make appointment for the AM, but none available.  Advised patient to call at 8:30 to see if she can be seen by anyone (will send this to Janett Billow, [place] manager and the preceptor for tomorrow AM).  Advised that if she pees well with Lasix 80mg , can take again in the AM if she doesn't get an appointment until later in the day.  Advised that if for some reason she can't be seen at [facility] tomorrow and not having good improvement in swelling, can go to urgent care.  Red flags discussed including that if patient's shortness of breath becomes worse overnight, she needs to return to the ED.  Will forward to PCP, Janett Billow, and Preceptor for AM (Dr. Owens Shark).  [place] Constable, DO PGY-3, [place] 06/25/2020 9:31 PM

## 2020-06-25 NOTE — Telephone Encounter (Signed)
Received fax from pharmacy, PA needed on Victoza. Clinical questions submitted via Cover My Meds. Victoza approved through 06/25/2020- 06/25/2021.  Cover My Meds info: Key: [id number]

## 2020-06-25 NOTE — Therapy (Signed)
[facility] [address] [place], [place], [address] Phone: [phone]   Fax:  [phone]  Patient Details  Name: [name] MRN: [id number] Date of Birth: 1976/08/27 Referring Provider:  No ref. provider found  Encounter Date: 06/25/2020   PHYSICAL THERAPY DISCHARGE SUMMARY  Visits from Start of Care: 14  Current functional level related to goals / functional outcomes:  PT Long Term Goals - 04/17/20 0832      PT LONG TERM GOAL #1   Title Patient verbalizes & demonstrates proper orthotic care and maintains current mod-I prosthetic care ability to enable safe use of orthosis & prosthesis. (All LTGs Target Date 07/11/2020)    Baseline 04/15/20: wearing 4-5 days a week, for 2 hours each day.  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    Time 10    Period Weeks    Status On-going    Target Date 07/11/20      PT LONG TERM GOAL #2   Title Patient tolerates prosthetic liner & AFO wear for >90% and prosthetic wear >75% of awake hours without skin integrity issues for improved mobility.    Baseline 04/15/20: pt is wearing liner >/= 90% of awake hours (even in bed), wearing prosthesis ~80% of awak hours out of bed ("pops it off at times when sitting to watch TV).  She recieved AFO mid April & has worn limited amount. 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 Patient reports wound is from pressure in bed not AFO.    Time 10    Period Weeks    Status Revised    Target Date 07/11/20      PT LONG TERM GOAL #3   Title Patient reports pain increases </= 3 increments with standing & gait increased activities noted in LTGs with RW, prosthesis, and orthosis.    Baseline 04/15/20: continues to vary depending on how long pt is standing, does report this has overall gotten better    Time 10    Period Weeks     Status On-going      PT LONG TERM GOAL #4   Title Standing balance with RW support reaching 5" anteriorly, picking up objects from floor, scanning environment & managing pants for toileting safely modified independent.    Baseline 02/20/20: min guard assist with RW/prosthesis/AFO to static stand for 3 minutes, reach 2-3 inches from floor (improved from 12/05/19 baseline). 04/15/20: unable to reassess this date    Time 10    Period Weeks    Status Revised    Target Date 07/11/20      PT LONG TERM GOAL #5   Title Patient ambulates 50' with RW & prosthesis & orthosis modified independent to enable basic community entry.    Baseline 02/20/20: min guard to supervision for 37 feet with RW/prosthesis/AFO (improved from 12/04/20 baseline); 04/15/20 unable to reassess today    Time 10    Period Weeks    Status On-going    Target Date 07/11/20      PT LONG TERM GOAL #6   Title Patient negotiates ramp & curb with RW & prosthesis & orthosis minA from family to enable community access.    Baseline 04/02/20: mod assist with RW/prosthesis/AFO for 4 inch curb negotiation, ramp has not been inititated to date; 04/15/20: unable to assess today    Time 10  Period Weeks    Status On-going    Target Date 07/11/20             Remaining deficits: Unsure as pt has had a medical decline with hospital admission.  Acute PTs recommending HHPT at this time, therefore will D/C and have her pursue new order when ready to transition back to OPPT.    Education / Equipment: HEP   Plan: Patient agrees to discharge.  Patient goals were not met. Patient is being discharged due to a change in medical status.  ?????         Cameron Sprang, PT, MPT [facility] [address] [place], [place], [address] Phone: [phone]   Fax:  [phone] 06/25/20, 8:15 AM

## 2020-06-25 NOTE — Telephone Encounter (Signed)
Community message sent to [employer]. Will await response. Patient called and informed.   Talbot Grumbling, RN

## 2020-06-25 NOTE — Telephone Encounter (Signed)
Thanks for sending this, Dr. Sandi Carne.  I have an opening in OB clinic tomorrow at 9:30 and went ahead and scheduled patient there with me. Could you call her tonight and let her know I can see her then? I am not able to call her right now.  Thanks [name], MD

## 2020-06-25 NOTE — Telephone Encounter (Signed)
DME ordered, will route back to RN team. Also sent in rx for duoneb medication to her pharmacy. Please let patient know I have done this (she will be anxious about it).  Thanks! [name], MD

## 2020-06-25 NOTE — Telephone Encounter (Signed)
Received confirmation from [facility] that order has been received by [employer].   Talbot Grumbling, RN

## 2020-06-26 ENCOUNTER — Telehealth: Payer: Self-pay | Admitting: Family Medicine

## 2020-06-26 ENCOUNTER — Telehealth: Payer: Self-pay | Admitting: Cardiology

## 2020-06-26 ENCOUNTER — Ambulatory Visit (INDEPENDENT_AMBULATORY_CARE_PROVIDER_SITE_OTHER): Payer: Medicaid Other | Admitting: Family Medicine

## 2020-06-26 ENCOUNTER — Other Ambulatory Visit: Payer: Self-pay

## 2020-06-26 ENCOUNTER — Ambulatory Visit: Payer: Medicaid Other

## 2020-06-26 VITALS — BP 120/80 | HR 103

## 2020-06-26 DIAGNOSIS — J9601 Acute respiratory failure with hypoxia: Secondary | ICD-10-CM

## 2020-06-26 DIAGNOSIS — Z23 Encounter for immunization: Secondary | ICD-10-CM

## 2020-06-26 MED ORDER — BENZONATATE 100 MG PO CAPS
100.0000 mg | ORAL_CAPSULE | Freq: Three times a day (TID) | ORAL | 0 refills | Status: DC | PRN
Start: 2020-06-26 — End: 2020-09-09

## 2020-06-26 MED ORDER — TORSEMIDE 20 MG PO TABS: 20.0000 mg | ORAL_TABLET | Freq: Every day | ORAL | 0 refills | Status: DC

## 2020-06-26 NOTE — Telephone Encounter (Signed)
Patient had a sleep study in May and was recently given a bipap machine while in the hospital. She is scheduled for 08/04/20, let me know if I need to do anything else. Thank you!

## 2020-06-26 NOTE — Telephone Encounter (Signed)
Dr. [name] was able to schedule patient with her in [facility] at 9:30am.  Called patient and advised of this.  She states that her breathing is "so-so" at present.  Advised of her appointment and again encouraged her that if she is having shortness of breath and especially if it worsens that she should come to ED.  She voices understanding but states that she would like to try to make it to her appointment in the AM.  Will forward to PCP.  [place] Constable, D.O.  PGY-3 Family Medicine  06/26/2020 12:24 AM

## 2020-06-26 NOTE — Progress Notes (Signed)
   Covid-19 Vaccination Clinic  Name:  Aedyn Mckeon    MRN: [id number] DOB: 02/13/1976  06/26/2020  [name] was observed post Covid-19 immunization for 15 minutes without incident. She was provided with Vaccine Information Sheet and instruction to access the V-Safe system.   [name] was instructed to call 911 with any severe reactions post vaccine: Marland Kitchen Difficulty breathing  . Swelling of face and throat  . A fast heartbeat  . A bad rash all over body  . Dizziness and weakness

## 2020-06-26 NOTE — Progress Notes (Signed)
  Date of Visit: 06/26/2020   SUBJECTIVE:   HPI:  [name] presents today for an acute visit. She called in last night reporting increased shortness of breath so we worked her in for this morning.  Discharged from the hospital 3 days ago, sent home with home bipap. She has been using this every night with good results, sleeping much better with this. Currently main symptoms are persistent cough and feeling like she's more swollen. Currently on lasix 40mg  daily but is not urinating as well on this. Also needs neb machine, did not get one at hospital discharge. Otherwise she is feeling well. Strongly wants to avoid rehospitalization if possible. She took lasix 80mg  dose last night without any urine response.  OBJECTIVE:   BP 120/80   Pulse (!) 103   SpO2 99%  Gen: no acute distress, pleasant, cooperative, wearing nasal canula HEENT: normocephalic, atraumatic  Heart: regular rate and rhythm, no murmur Lungs: clear to auscultation bilaterally, upper airway "wheeze" but no true lower airway wheeze present Neuro: alert, speech normal, grossly nonfocal Ext: R shin with 2+ pitting edema Abdomen: slight edema of abdominal skin  ASSESSMENT/PLAN:   [name] is seen today for acute visit for increasing edema and cough. She actually looks well on exam and I don't appreciate any significant abnormal lung sounds (exam challenging due to habitus). During visit I turned her oxygen down to room air and her sats remained 94% and above. Reviewed with her why excessive O2 supplementation could be dangerous for her, in setting of likely chronic CO2 retention. She does seem volume up, has minimal response to current dose of lasix.   Plan: - tessalon rx for cough (helped in hospital) - change lasix to torsemide 20mg  daily - neb machine given in clinic (helped in hospital) - patient will let us know if this does not help - follow up in 1 week at pulmonology appointment, ideally get labs (BMET) there  Given  recent illness and current symptoms, gave option to postpone COVID vaccine until she is feeling better, but patient elected to proceed with getting it today. COVID vaccine #1 given today in clinic, follow up in 3 weeks for second dose  Has appointment with me in 2 weeks.  [place]. [name], [name]

## 2020-06-26 NOTE — Patient Instructions (Addendum)
Change lasix to torsemide 20mg  daily  Follow up in 1 week, sooner if needed  Use neb machine every 4-6 hours as needed  Sent in tessalon for you  Call us if worsening  Be well, Dr. [name]

## 2020-06-26 NOTE — Patient Instructions (Signed)
Visit Information  Goals Addressed              This Visit's Progress   .  I want to breath better (pt-stated)        Current Barriers:   Knowledge deficit related to basic COPD self care/management  Knowledge deficit related to basic understanding of how to use inhalers and how inhaled medications work  Knowledge deficit related to importance of energy conservation   Nurse Case Manager Clinical Goal(s):  Over the next 30 days patient will report using inhalers as prescribed including rinsing mouth after use  Over the next 30 days patient will report utilizing pursed lip breathing for shortness of breath  Over the next 30 days, patient will be able to verbalize understanding of COPD action plan and when to seek appropriate levels of medical care  Over the next 60 days, patient will engage in lite exercise as tolerated to build/regain stamina and strength and reduce shortness of breath through activity tolerance   Interventions:   Provided patient with basic verbal COPD education on self care/management/and exacerbation prevention   Provided patient with Verbal COPD action plan and reinforced importance of daily self assessment  Provided verbal instructions on pursed lip breathing and utilized returned demonstration as teach back  Will send patient written information on COPD action Plan and Pursed lip breathing  Patient was seen at the office for hospital follow up today  She states that she may have to have a prior authorization for her torsemide. I advised her if she does not have the medication tomorrow to call me.   Patient Self Care Activities:   Take medications as prescribed including inhalers  Practice and use pursed lip breathing for shortness of breath recovery and prevention  Self assess COPD action plan zone and make appointment with provider if you have been in the yellow zone for 48 hours without improvement.  Engage in lite exercise as tolerated 3-5  days a week as soon as she feels stronger  Utilize infection prevention strategies to reduce risk of respiratory infection    Initial          [name] was given information about Care Management services today including:  1. Care Management services include personalized support from designated clinical staff supervised by her physician, including individualized plan of care and coordination with other care providers 2. 24/7 contact phone numbers for assistance for urgent and routine care needs. 3. The patient may stop CCM services at any time (effective at the end of the month) by phone call to the office staff.  Patient agreed to services and verbal consent obtained.   The patient verbalized understanding of instructions provided today and declined a print copy of patient instruction materials.   The care management team will reach out to the patient again over the next 10 days.  The patient has been provided with contact information for the care management team and has been advised to call with any health related questions or concerns.   Lazaro Arms RN, BSN, [facility] Care Management Coordinator [place] Phone: [phone] Fax: [phone]

## 2020-06-26 NOTE — Chronic Care Management (AMB) (Signed)
Care Management   Initial Visit Note  06/26/2020 Name: [name] MRN: [id number] DOB: [birth year]   Assessment: [name] is a 44 y.o. year old female who sees [name], [name], MD for primary care. The care management team was consulted for assistance with care management and care coordination needs related to Disease Management  Educational Needs COPD DM II    Review of patient status, including review of consultants reports, relevant laboratory and other test results, and collaboration with appropriate care team members and the patient's provider was performed as part of comprehensive patient evaluation and provision of care management services.    SDOH (Social Determinants of Health) assessments performed: No See Care Plan activities for detailed interventions related to [facility])     Outpatient Encounter Medications as of 06/26/2020  Medication Sig Note   Accu-Chek Softclix Lancets lancets Use to check blood glucose four times daily    amLODipine (NORVASC) 10 MG tablet Take 1 tablet (10 mg total) by mouth daily.    atorvastatin (LIPITOR) 40 MG tablet Take 1 tablet (40 mg total) by mouth daily.    budesonide-formoterol (SYMBICORT) 160-4.5 MCG/ACT inhaler Inhale 2 puffs into the lungs 2 (two) times daily.    Continuous Blood Gluc Sensor (DEXCOM G6 SENSOR) MISC Inject 1 applicator into the skin as directed. Change sensor every 10 days.    Continuous Blood Gluc Transmit (DEXCOM G6 TRANSMITTER) MISC Inject 1 Device into the skin as directed. Reuse 8 times with sensor changes.    gabapentin (NEURONTIN) 600 MG tablet TAKE 2 TABLETS BY MOUTH 3 TIMES DAILY    glucose blood (ACCU-CHEK GUIDE) test strip USE T0 CHECK BLOOD SUGAR 3 TIMES DAILY    HYDROcodone-acetaminophen (NORCO) 10-325 MG tablet TAKE 1 TABLET BY MOUTH EVERY EIGHT HOURS AS NEEDED    insulin isophane & regular human (HUMULIN 70/30 MIX) (70-30) 100 UNIT/ML KwikPen Inject 110 Units into the skin daily with breakfast AND 90  Units daily with supper.    Insulin Pen Needle (SURE COMFORT PEN NEEDLES) 32G X 4 MM MISC Use to inject insulin as indicated    Lancets (ACCU-CHEK MULTICLIX) lancets 1 each by Other route as needed for other. Use as instructed    metoprolol succinate (TOPROL XL) 25 MG 24 hr tablet Take 1 tablet (25 mg total) by mouth daily.    Misc. Devices (PULSE OXIMETER FOR FINGER) MISC Use as needed to check pulse oxymetry    pantoprazole (PROTONIX) 40 MG tablet Take 1 tablet (40 mg total) by mouth daily.    PROVENTIL HFA 108 (90 Base) MCG/ACT inhaler Inhale 1-2 puffs into the lungs every 6 (six) hours as needed for wheezing or shortness of breath. (Patient taking differently: Inhale 2 puffs into the lungs every 4 (four) hours as needed for wheezing. )    rOPINIRole (REQUIP) 1 MG tablet Take 1 tablet (1 mg total) by mouth at bedtime.    sodium chloride (OCEAN) 0.65 % SOLN nasal spray Place 1 spray into both nostrils as needed for congestion.    VICTOZA 18 MG/3ML SOPN inject 0.6 MG SUBCUTANEOUSLY ONCE A DAY FOR 1 WEEK, THEN INCREASE TO 1.2 MG ONCE daily max 1.8 MG (Patient taking differently: Inject 1.2 mg into the skin daily. )    Cholecalciferol 1000 units tablet Take 1 tablet (1,000 Units total) by mouth daily.    ipratropium-albuterol (DUONEB) 0.5-2.5 (3) MG/3ML SOLN Take 3 mLs by nebulization every 4 (four) hours as needed. (Patient not taking: Reported on 06/26/2020) 06/26/2020: Waiting for  pharmacy   Misc. Devices MISC Shrinkers and liners for prosthesis    torsemide (DEMADEX) 20 MG tablet Take 1 tablet (20 mg total) by mouth daily. (Patient not taking: Reported on 06/26/2020) 06/26/2020: Waiting for pharmacy   No facility-administered encounter medications on file as of 06/26/2020.    Goals Addressed              This Visit's Progress     I want to breath better (pt-stated)        Current Barriers:   Knowledge deficit related to basic COPD self care/management  Knowledge deficit  related to basic understanding of how to use inhalers and how inhaled medications work  Knowledge deficit related to importance of energy conservation   Nurse Case Manager Clinical Goal(s):  Over the next 30 days patient will report using inhalers as prescribed including rinsing mouth after use  Over the next 30 days patient will report utilizing pursed lip breathing for shortness of breath  Over the next 30 days, patient will be able to verbalize understanding of COPD action plan and when to seek appropriate levels of medical care  Over the next 60 days, patient will engage in lite exercise as tolerated to build/regain stamina and strength and reduce shortness of breath through activity tolerance   Interventions:   Provided patient with basic verbal COPD education on self care/management/and exacerbation prevention   Provided patient with Verbal COPD action plan and reinforced importance of daily self assessment  Provided verbal instructions on pursed lip breathing and utilized returned demonstration as teach back  Will send patient written information on COPD action Plan and Pursed lip breathing  Patient was seen at the office for hospital follow up today  She states that she may have to have a prior authorization for her torsemide. I advised her if she does not have the medication tomorrow to call me.   Patient Self Care Activities:   Take medications as prescribed including inhalers  Practice and use pursed lip breathing for shortness of breath recovery and prevention  Self assess COPD action plan zone and make appointment with provider if you have been in the yellow zone for 48 hours without improvement.  Engage in lite exercise as tolerated 3-5 days a week as soon as she feels stronger  Utilize infection prevention strategies to reduce risk of respiratory infection    Initial          . Lab Results  Component Value Date   HGBA1C 12.7 (H) 06/14/2020    Follow  up plan:  The care management team will reach out to the patient again over the next 10 days.  The patient has been provided with contact information for the care management team and has been advised to call with any health related questions or concerns.   [name] was given information about Care Management services today including:  1. Care Management services include personalized support from designated clinical staff supervised by a physician, including individualized plan of care and coordination with other care providers 2. 24/7 contact phone numbers for assistance for urgent and routine care needs. 3. The patient may stop Care Management services at any time (effective at the end of the month) by phone call to the office staff.  Patient agreed to services and verbal consent obtained.  Lazaro Arms RN, BSN, [facility] Care Management Coordinator [place] Phone: [phone] Fax: [phone]

## 2020-06-27 ENCOUNTER — Inpatient Hospital Stay: Payer: Medicaid Other | Admitting: Family Medicine

## 2020-06-27 IMAGING — US US RENAL
1 series · 14 of 25 positions shown · non-contrast
Comparison: None.

CLINICAL DATA: Acute kidney injury

EXAM:
RENAL / URINARY TRACT ULTRASOUND COMPLETE

[Series 1: us renal · 14 of 33 slices shown]
[im 1/33]
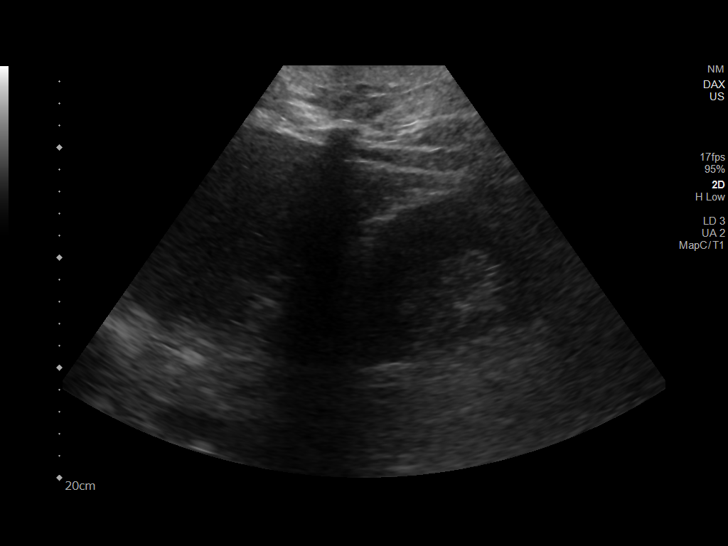
[im 3/33]
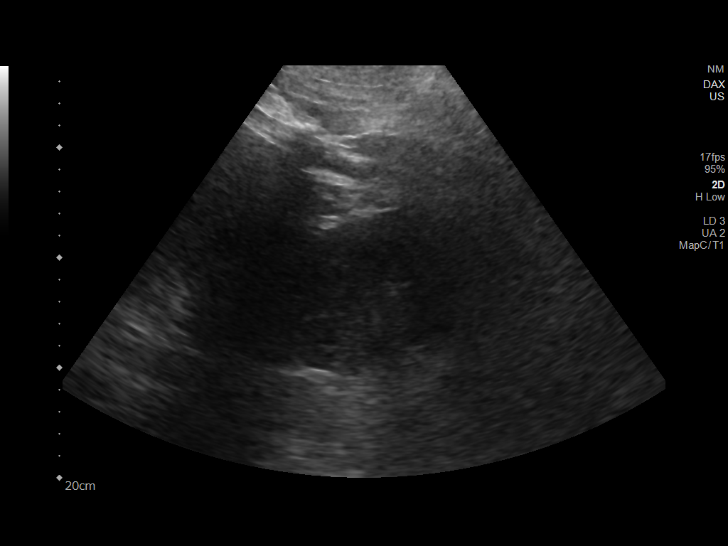
[im 6/33]
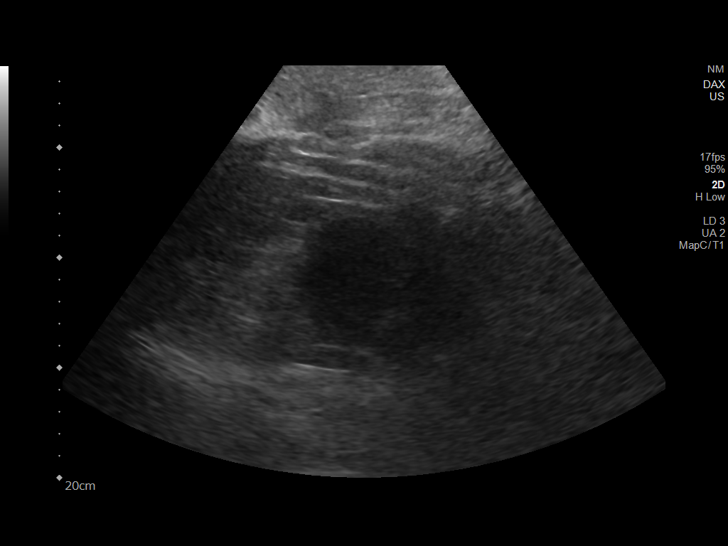
[im 9/33]
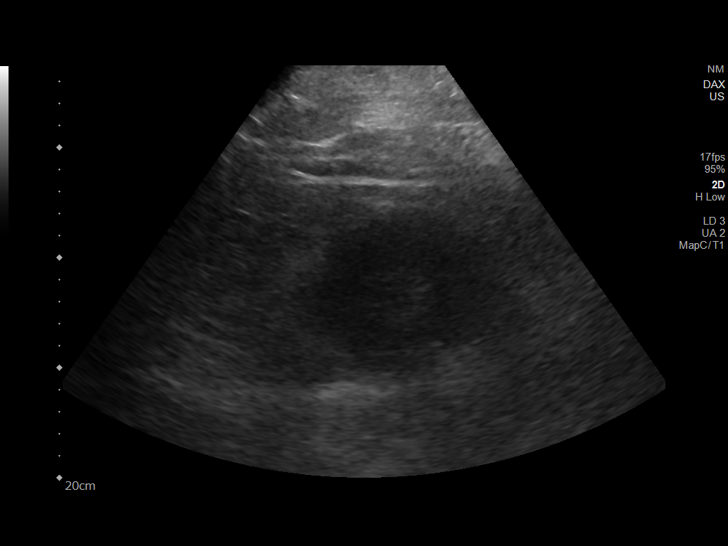
[im 11/33]
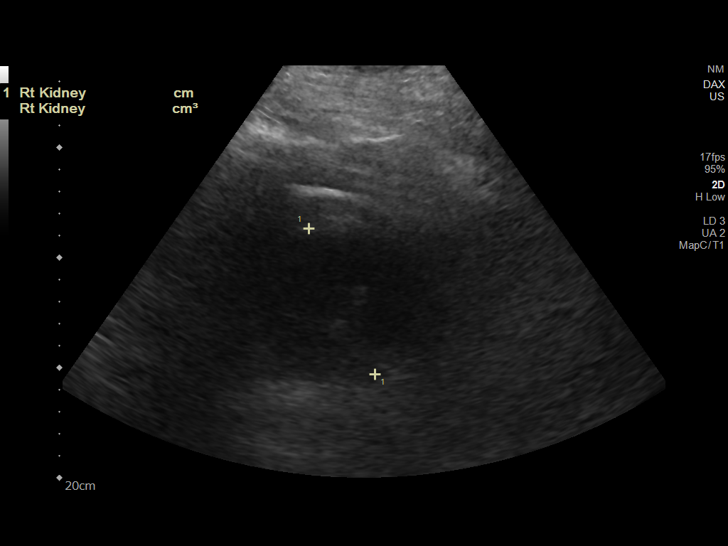
[im 13/33]
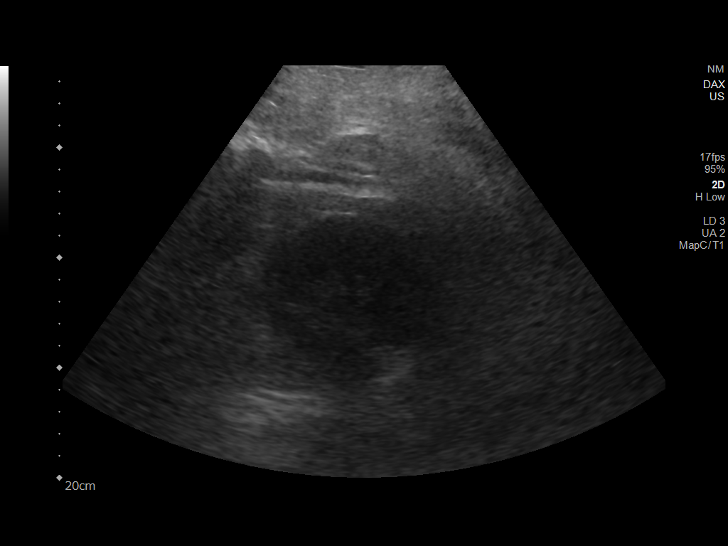
[im 15/33]
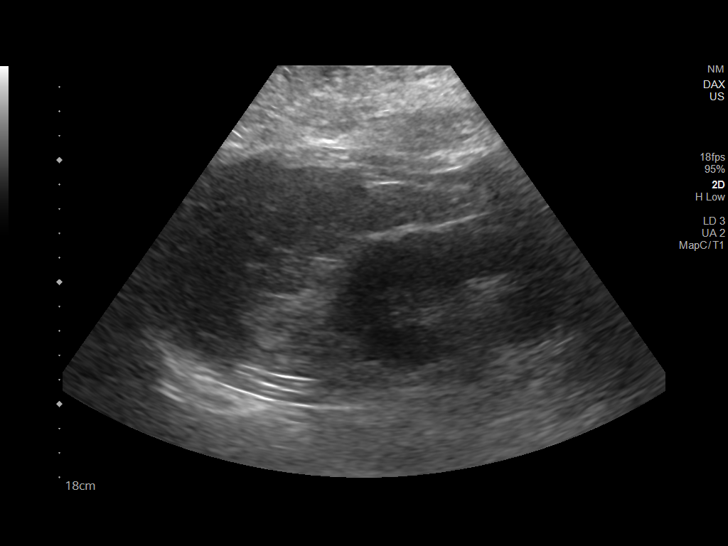
[im 18/33]
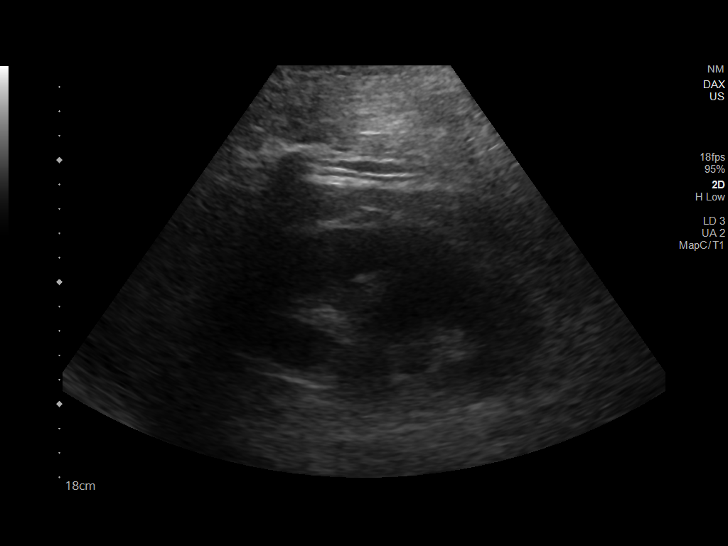
[im 21/33]
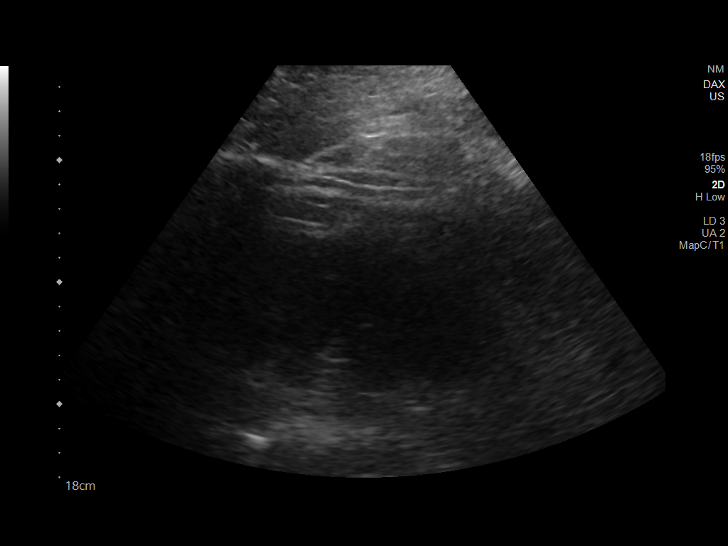
[im 22/33]
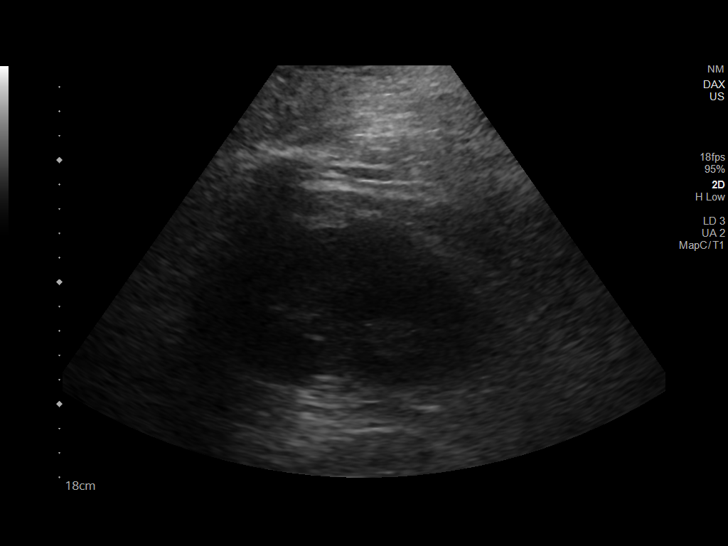
[im 25/33]
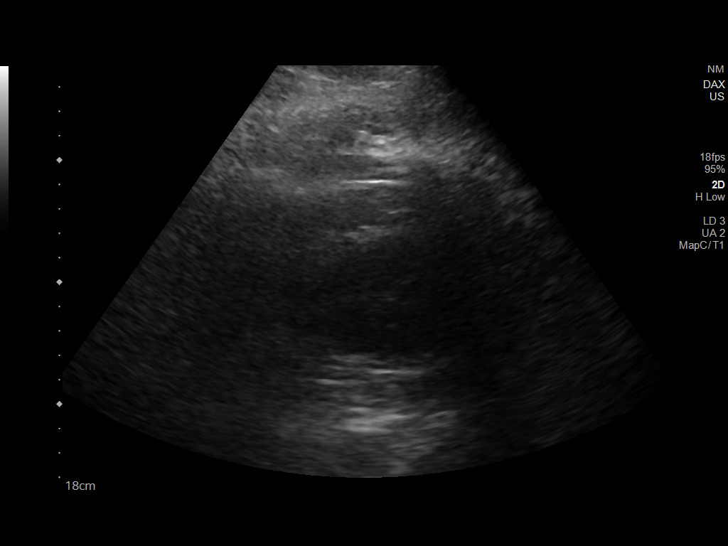
[im 27/33]
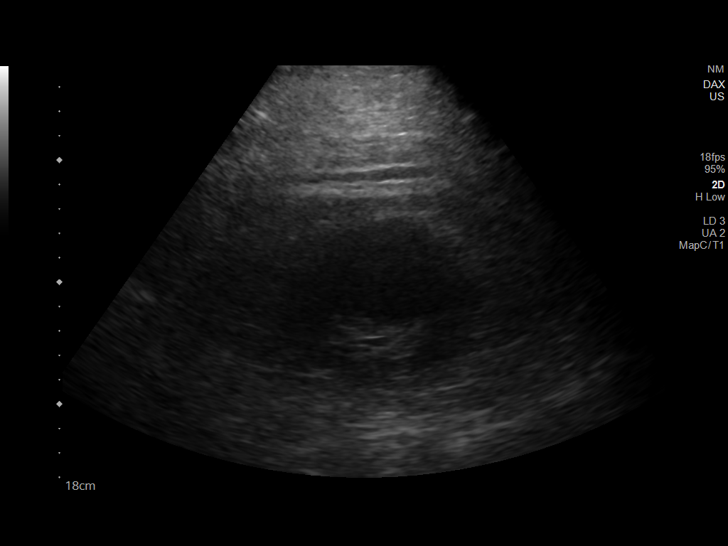
[im 30/33]
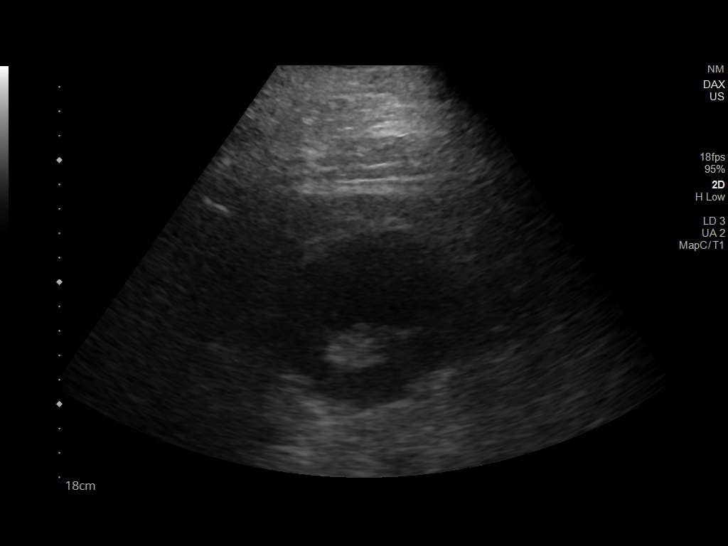
[im 33/33]
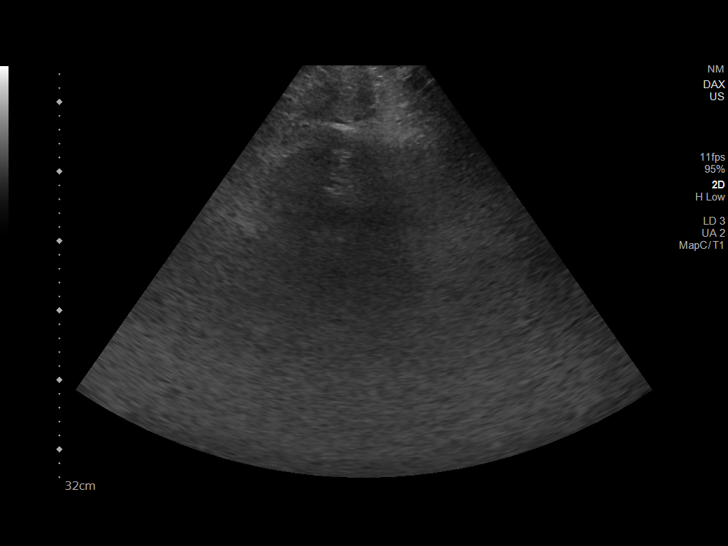

[14 of 25 positions shown; findings below may reference images not displayed]

FINDINGS: Right Kidney:

Renal measurements: 10.1 x 7.4 x 7.3 cm = volume: 282 mL.
Examination was very limited secondary to patient body habitus.
There is no definite frank hydronephrosis.

Left Kidney:

Renal measurements: 12.6 x 6.9 x 7.2 cm = volume: 323 mL. Evaluation
was significantly limited by patient body habitus. There is no frank
hydronephrosis.

Bladder:

Not visualized
IMPRESSION: 1. Very limited study secondary to patient body habitus. The bladder
was not visualized.
2. No frank hydronephrosis.

## 2020-06-27 IMAGING — CR ABDOMEN - 2 VIEW
2 series · 2 of 2 positions shown · non-contrast
Comparison: CT abdomen and pelvis 12/10/2016

CLINICAL DATA: Abdominal pain, LEFT buttock abscess

EXAM:
ABDOMEN - 2 VIEW

[abdomen supine]
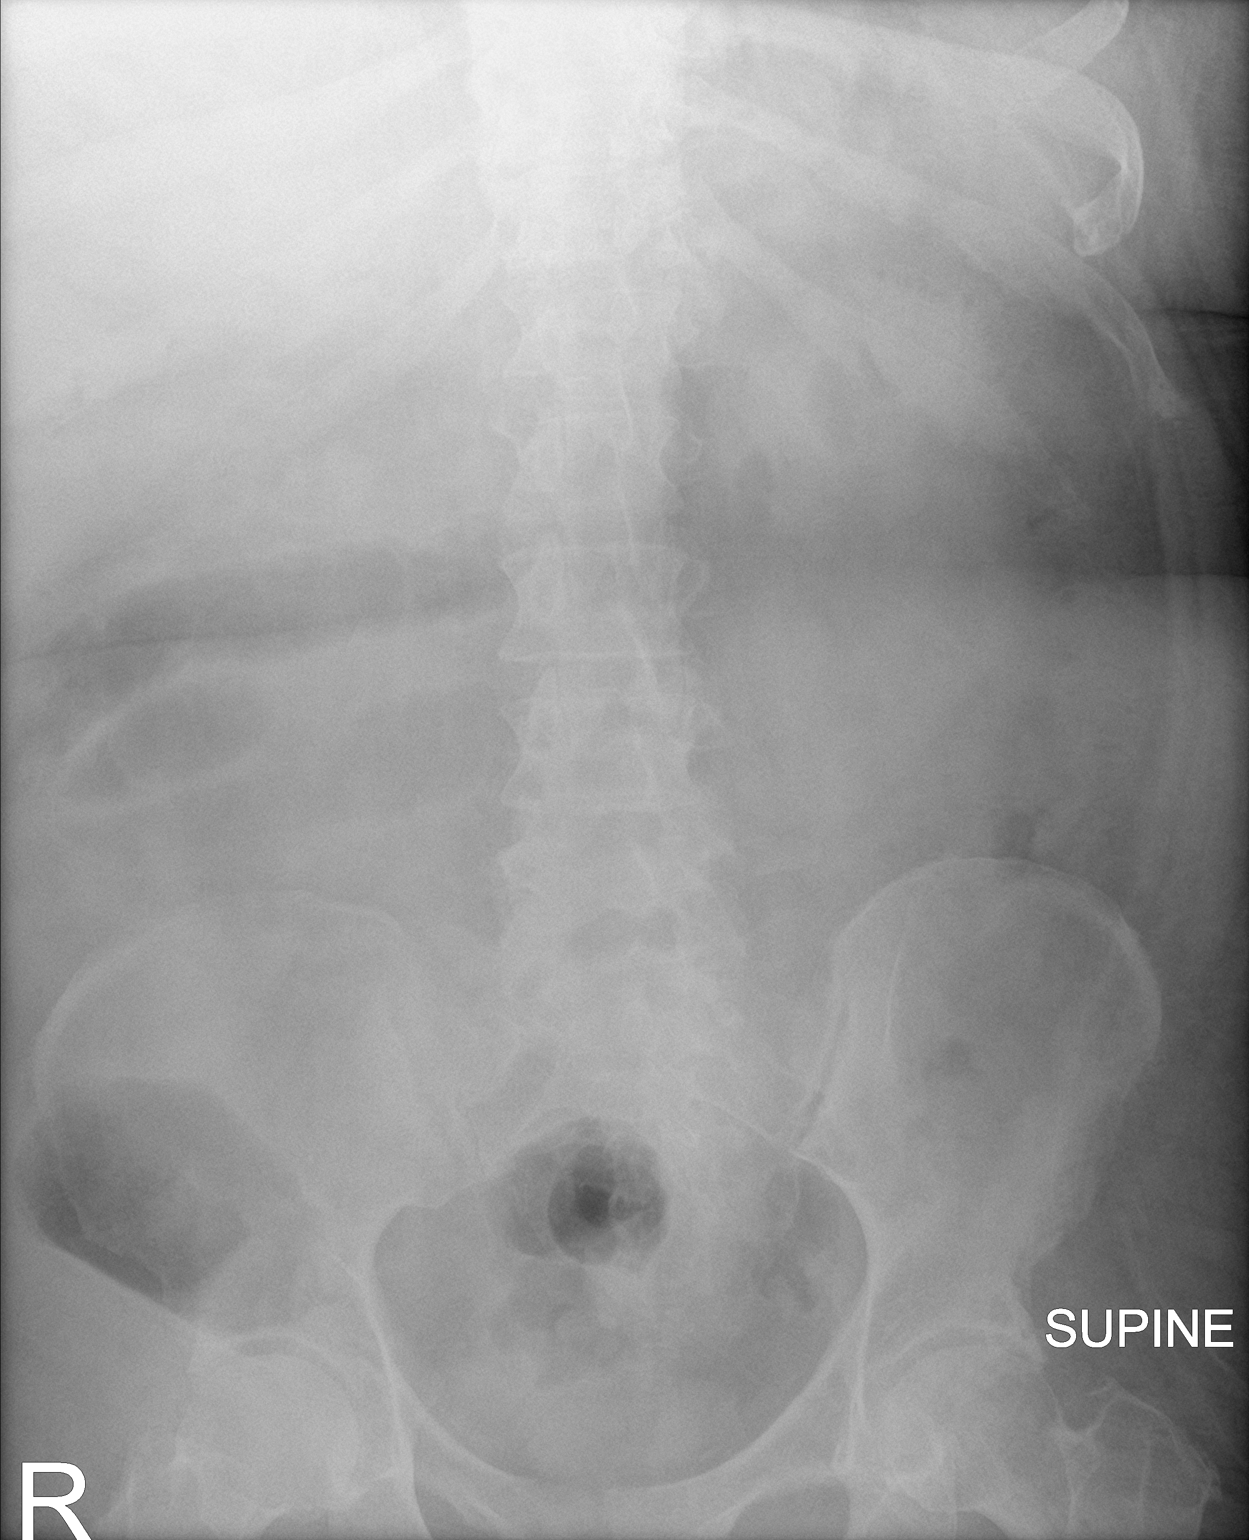

[abdomen decu]
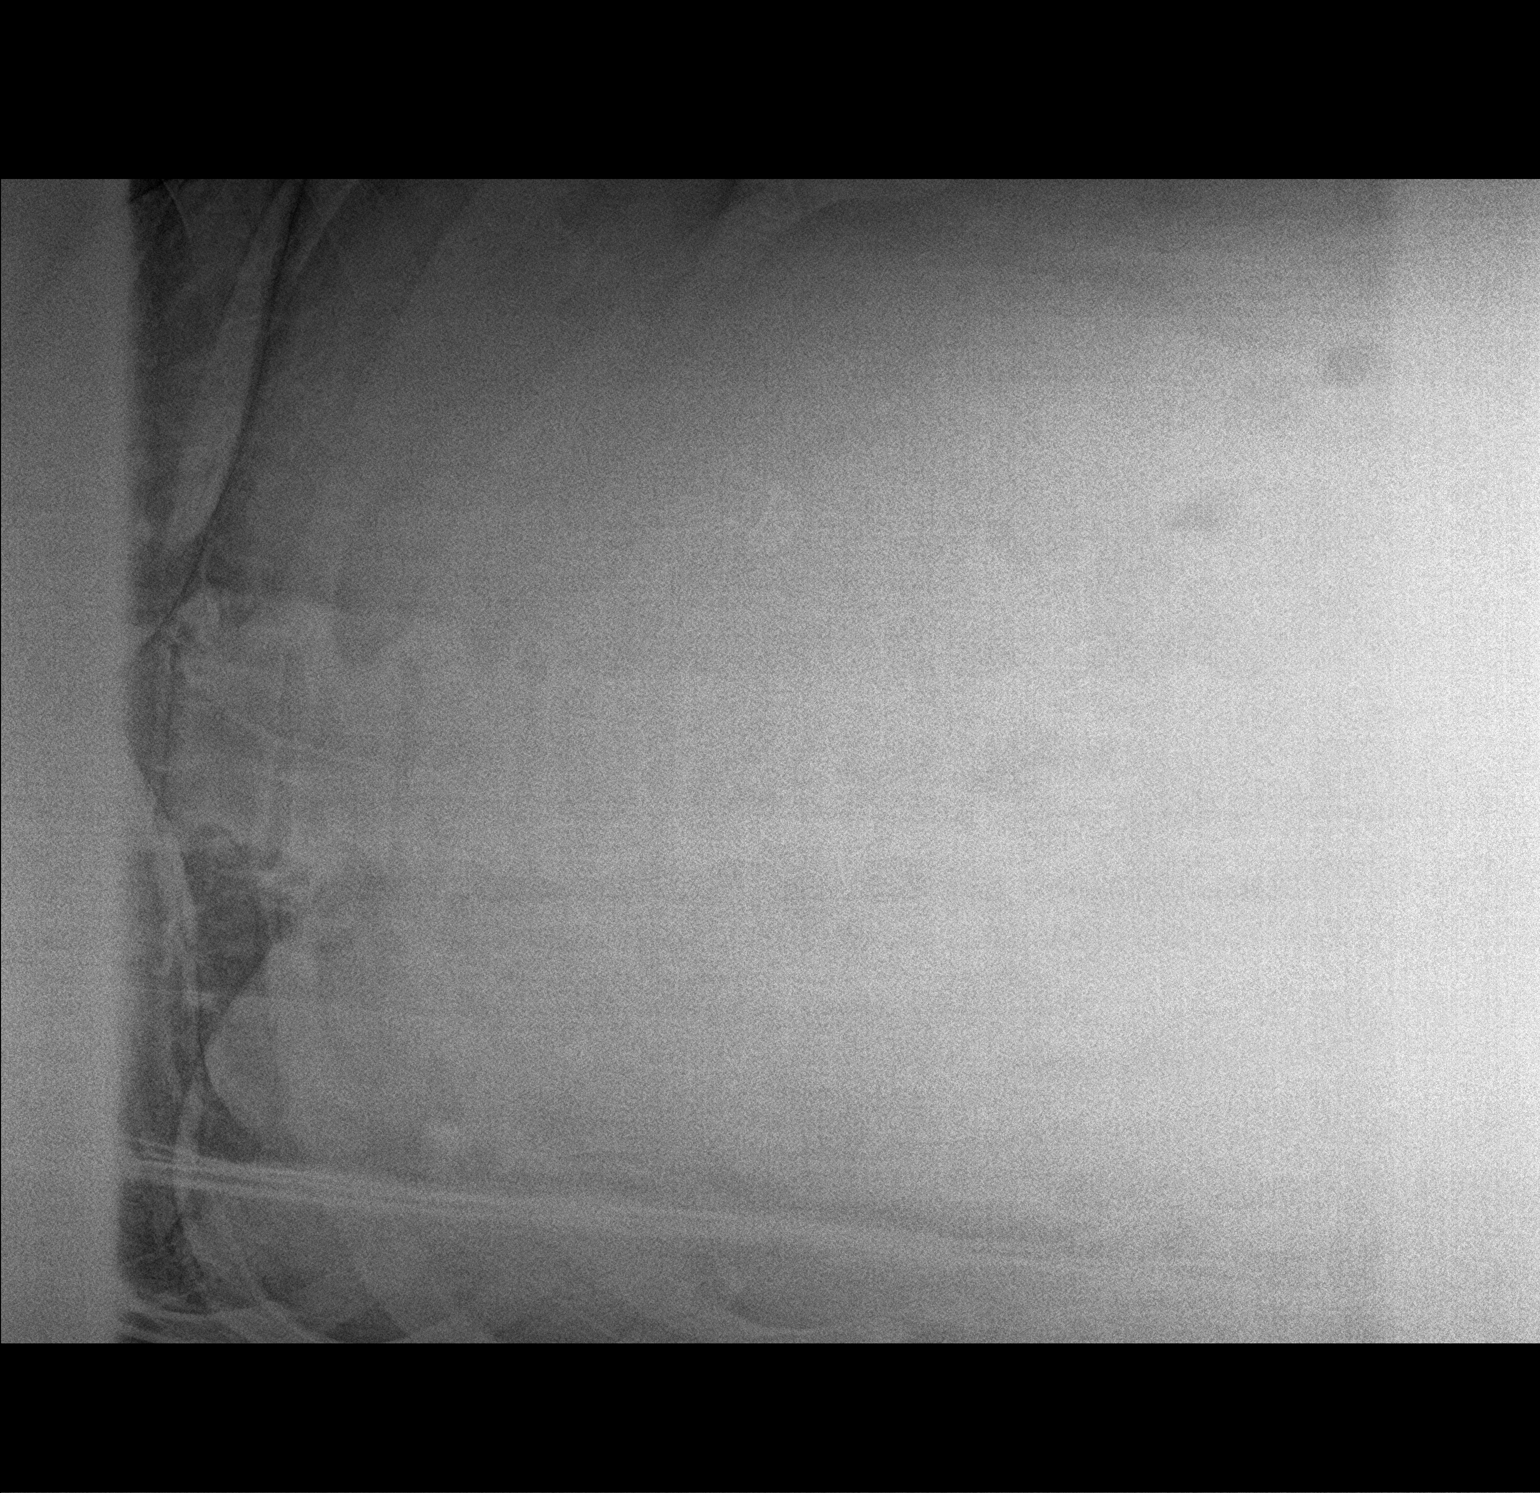

[2 of 2 positions shown; findings below may reference images not displayed]

FINDINGS: Scattered gas within nondistended large and small bowel loops.

No bowel wall thickening or free air.

Osseous structures unremarkable.
IMPRESSION: Nonobstructive bowel gas pattern.

## 2020-06-27 NOTE — Telephone Encounter (Signed)
Cancelled order as patient was given nebulizer in clinic yesterday.   Talbot Grumbling, RN

## 2020-06-30 DIAGNOSIS — H524 Presbyopia: Secondary | ICD-10-CM | POA: Diagnosis not present

## 2020-06-30 DIAGNOSIS — J9601 Acute respiratory failure with hypoxia: Secondary | ICD-10-CM | POA: Diagnosis not present

## 2020-07-01 DIAGNOSIS — E1140 Type 2 diabetes mellitus with diabetic neuropathy, unspecified: Secondary | ICD-10-CM | POA: Diagnosis not present

## 2020-07-01 NOTE — Telephone Encounter (Signed)
Reached out to patient to offer any other assistance if needed with her bipap and patient states she was ok until she talks to Dr Radford Pax.

## 2020-07-02 ENCOUNTER — Encounter: Payer: Medicaid Other | Admitting: Rehabilitation

## 2020-07-02 DIAGNOSIS — E1140 Type 2 diabetes mellitus with diabetic neuropathy, unspecified: Secondary | ICD-10-CM | POA: Diagnosis not present

## 2020-07-03 ENCOUNTER — Institutional Professional Consult (permissible substitution): Payer: Medicaid Other | Admitting: Pulmonary Disease

## 2020-07-03 DIAGNOSIS — E1140 Type 2 diabetes mellitus with diabetic neuropathy, unspecified: Secondary | ICD-10-CM | POA: Diagnosis not present

## 2020-07-04 ENCOUNTER — Ambulatory Visit: Payer: Medicaid Other

## 2020-07-04 ENCOUNTER — Other Ambulatory Visit: Payer: Self-pay

## 2020-07-04 ENCOUNTER — Telehealth: Payer: Self-pay

## 2020-07-04 DIAGNOSIS — E1140 Type 2 diabetes mellitus with diabetic neuropathy, unspecified: Secondary | ICD-10-CM | POA: Diagnosis not present

## 2020-07-04 MED ORDER — HYDROCODONE-ACETAMINOPHEN 10-325 MG PO TABS: ORAL_TABLET | ORAL | 0 refills | Status: DC

## 2020-07-04 NOTE — Telephone Encounter (Signed)
Patient calls nurse line requesting a PA on Norco 10-325mg . Clinical questions submitted via Cover My Meds. Waiting on response, could take up to 72 hours.  Cover My Meds info: Key: [id number]

## 2020-07-04 NOTE — Chronic Care Management (AMB) (Signed)
Care Management   Follow Up Note   07/04/2020 Name: [name] MRN: [id number] DOB: 08-Sep-1976  Referred by: [name], MD Reason for referral : Appointment (COPD)   [name] is a 44 y.o. year old female who is a primary care patient of [name] Belinda Limber, MD. The care management team was consulted for assistance with care management and care coordination needs.    Review of patient status, including review of consultants reports, relevant laboratory and other test results, and collaboration with appropriate care team members and the patient's provider was performed as part of comprehensive patient evaluation and provision of chronic care management services.    SDOH (Social Determinants of Health) assessments performed: No See Care Plan activities for detailed interventions related to [facility])     Advanced Directives: See Care Plan and Vynca application for related entries.   Goals Addressed              This Visit's Progress   .  I want to breath better (pt-stated)        Current Barriers:   Knowledge deficit related to basic COPD self care/management  Knowledge deficit related to basic understanding of how to use inhalers and how inhaled medications work  Knowledge deficit related to importance of energy conservation   Nurse Case Manager Clinical Goal(s):  Over the next 30 days patient will report using inhalers as prescribed including rinsing mouth after use  Over the next 30 days patient will report utilizing pursed lip breathing for shortness of breath  Over the next 30 days, patient will be able to verbalize understanding of COPD action plan and when to seek appropriate levels of medical care  Over the next 60 days, patient will engage in lite exercise as tolerated to build/regain stamina and strength and reduce shortness of breath through activity tolerance   Interventions:   Provided patient with basic verbal COPD education on self  care/management/and exacerbation prevention   Provided patient with Verbal COPD action plan and reinforced importance of daily self assessment  Provided verbal instructions on pursed lip breathing and utilized returned demonstration as teach back  Will send patient written information on COPD action Plan and Pursed lip breathing  Patient was seen at the office for hospital follow up today    07/04/20  Patient states that she is doing well no shortness of breath no cough wheezing or chest pain.  Discussed the COPD action Plan  The patient received the educational material that I sent her but she states that she has not reviewed it yet  She states that she is using her Oxygen when she sleeps  and prn during the day.  She states that she missed her pulmonology appointment and needs to reschedule.   Patient Self Care Activities:   Take medications as prescribed including inhalers  Practice and use pursed lip breathing for shortness of breath recovery and prevention  Self assess COPD action plan zone and make appointment with provider if you have been in the yellow zone for 48 hours without improvement.  Engage in lite exercise as tolerated 3-5 days a week as soon as she feels stronger  Utilize infection prevention strategies to reduce risk of respiratory infection    Initial           The care management team will reach out to the patient again over the next 14 days.   Lazaro Arms RN, BSN, [facility] Care Management Coordinator [place] Phone: [phone]  Fax: [phone]

## 2020-07-04 NOTE — Patient Instructions (Signed)
Visit Information  Goals Addressed              This Visit's Progress     I want to breath better (pt-stated)        Current Barriers:   Knowledge deficit related to basic COPD self care/management  Knowledge deficit related to basic understanding of how to use inhalers and how inhaled medications work  Knowledge deficit related to importance of energy conservation   Nurse Case Manager Clinical Goal(s):  Over the next 30 days patient will report using inhalers as prescribed including rinsing mouth after use  Over the next 30 days patient will report utilizing pursed lip breathing for shortness of breath  Over the next 30 days, patient will be able to verbalize understanding of COPD action plan and when to seek appropriate levels of medical care  Over the next 60 days, patient will engage in lite exercise as tolerated to build/regain stamina and strength and reduce shortness of breath through activity tolerance   Interventions:   Provided patient with basic verbal COPD education on self care/management/and exacerbation prevention   Provided patient with Verbal COPD action plan and reinforced importance of daily self assessment  Provided verbal instructions on pursed lip breathing and utilized returned demonstration as teach back  Will send patient written information on COPD action Plan and Pursed lip breathing  Patient was seen at the office for hospital follow up today    07/04/20  Patient states that she is doing well no shortness of breath no cough wheezing or chest pain.  Discussed the COPD action Plan  The patient received the educational material that I sent her but she states that she has not reviewed it yet  She states that she is using her Oxygen when she sleeps  and prn during the day.  She states that she missed her pulmonology appointment and needs to reschedule.   Patient Self Care Activities:   Take medications as prescribed including  inhalers  Practice and use pursed lip breathing for shortness of breath recovery and prevention  Self assess COPD action plan zone and make appointment with provider if you have been in the yellow zone for 48 hours without improvement.  Engage in lite exercise as tolerated 3-5 days a week as soon as she feels stronger  Utilize infection prevention strategies to reduce risk of respiratory infection    Initial          [name] was given information about Care Management services today including:  1. Care Management services include personalized support from designated clinical staff supervised by her physician, including individualized plan of care and coordination with other care providers 2. 24/7 contact phone numbers for assistance for urgent and routine care needs. 3. The patient may stop CCM services at any time (effective at the end of the month) by phone call to the office staff.  Patient agreed to services and verbal consent obtained.   The patient verbalized understanding of instructions provided today and declined a print copy of patient instruction materials.   The care management team will reach out to the patient again over the next 14 days.   Lazaro Arms RN, BSN, [facility] Care Management Coordinator [place] Phone: [phone] Fax: [phone]

## 2020-07-05 DIAGNOSIS — E1140 Type 2 diabetes mellitus with diabetic neuropathy, unspecified: Secondary | ICD-10-CM | POA: Diagnosis not present

## 2020-07-06 DIAGNOSIS — E1140 Type 2 diabetes mellitus with diabetic neuropathy, unspecified: Secondary | ICD-10-CM | POA: Diagnosis not present

## 2020-07-07 DIAGNOSIS — E1140 Type 2 diabetes mellitus with diabetic neuropathy, unspecified: Secondary | ICD-10-CM | POA: Diagnosis not present

## 2020-07-07 NOTE — Telephone Encounter (Signed)
Med approved for 07/04/2020-12/31/2020. Pharmacy has been informed.

## 2020-07-08 ENCOUNTER — Telehealth: Payer: Self-pay | Admitting: *Deleted

## 2020-07-08 DIAGNOSIS — E1140 Type 2 diabetes mellitus with diabetic neuropathy, unspecified: Secondary | ICD-10-CM | POA: Diagnosis not present

## 2020-07-08 NOTE — Chronic Care Management (AMB) (Signed)
  Care Management   Note  07/08/2020 Name: [name] MRN: [id number] DOB: Apr 16, 1976  [name] is a 44 y.o. year old female who is a primary care patient of [name], MD and is actively engaged with the care management team. I reached out to [name] by phone today to assist with re-scheduling a follow up visit with the RN Case Manager  Follow up plan: Telephone appointment with care management team member scheduled for:07/21/20  Bohdan Macho  Care Guide, [place] [facility]  [place], [place] [address] Direct Dial: [email or web address] Website: [email or web address]

## 2020-07-09 ENCOUNTER — Encounter: Payer: Medicaid Other | Admitting: Rehabilitation

## 2020-07-09 DIAGNOSIS — E1140 Type 2 diabetes mellitus with diabetic neuropathy, unspecified: Secondary | ICD-10-CM | POA: Diagnosis not present

## 2020-07-10 ENCOUNTER — Other Ambulatory Visit (HOSPITAL_COMMUNITY)
Admission: RE | Admit: 2020-07-10 | Discharge: 2020-07-10 | Disposition: A | Discharge status: Home / Self Care | Payer: Medicaid Other | Source: Ambulatory Visit | Attending: Family Medicine | Admitting: Family Medicine

## 2020-07-10 ENCOUNTER — Inpatient Hospital Stay (HOSPITAL_COMMUNITY): Admission: RE | Admit: 2020-07-10 | Payer: Medicaid Other | Source: Ambulatory Visit

## 2020-07-10 ENCOUNTER — Other Ambulatory Visit: Payer: Self-pay

## 2020-07-10 ENCOUNTER — Encounter: Payer: Self-pay | Admitting: Family Medicine

## 2020-07-10 ENCOUNTER — Telehealth: Payer: Self-pay

## 2020-07-10 ENCOUNTER — Ambulatory Visit (INDEPENDENT_AMBULATORY_CARE_PROVIDER_SITE_OTHER): Payer: Medicaid Other | Admitting: Family Medicine

## 2020-07-10 VITALS — BP 120/65 | HR 97 | Wt 364.4 lb

## 2020-07-10 DIAGNOSIS — E1140 Type 2 diabetes mellitus with diabetic neuropathy, unspecified: Secondary | ICD-10-CM | POA: Diagnosis not present

## 2020-07-10 DIAGNOSIS — Z124 Encounter for screening for malignant neoplasm of cervix: Secondary | ICD-10-CM

## 2020-07-10 DIAGNOSIS — G8929 Other chronic pain: Secondary | ICD-10-CM

## 2020-07-10 DIAGNOSIS — Z89512 Acquired absence of left leg below knee: Secondary | ICD-10-CM

## 2020-07-10 DIAGNOSIS — Z1159 Encounter for screening for other viral diseases: Secondary | ICD-10-CM | POA: Diagnosis not present

## 2020-07-10 DIAGNOSIS — J9601 Acute respiratory failure with hypoxia: Secondary | ICD-10-CM

## 2020-07-10 DIAGNOSIS — E662 Morbid (severe) obesity with alveolar hypoventilation: Secondary | ICD-10-CM

## 2020-07-10 MED ORDER — COMPRESSION BANDAGES MISC: 0 refills | Status: DC

## 2020-07-10 NOTE — Patient Instructions (Signed)
It was great to see you again today!  Will work on prior auth  Pap today  Checking labs  Follow up in September  Call with questions  Be well, Dr. [name]
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
## 2020-07-10 NOTE — Telephone Encounter (Signed)
CGM PA filled out and faxed to Medicaid. Original is at my desk in nurse room in green folder.

## 2020-07-11 ENCOUNTER — Other Ambulatory Visit: Payer: Self-pay | Admitting: Family Medicine

## 2020-07-11 ENCOUNTER — Encounter: Payer: Self-pay | Admitting: Family Medicine

## 2020-07-11 DIAGNOSIS — E1140 Type 2 diabetes mellitus with diabetic neuropathy, unspecified: Secondary | ICD-10-CM | POA: Diagnosis not present

## 2020-07-11 LAB — CYTOLOGY - PAP
Comment: NEGATIVE
Diagnosis: NEGATIVE
High risk HPV: NEGATIVE

## 2020-07-11 LAB — BASIC METABOLIC PANEL
BUN/Creatinine Ratio: 25 — ABNORMAL HIGH (ref 9–23)
BUN: 22 mg/dL (ref 6–24)
CO2: 26 mmol/L (ref 20–29)
Calcium: 9.7 mg/dL (ref 8.7–10.2)
Chloride: 93 mmol/L — ABNORMAL LOW (ref 96–106)
Creatinine, Ser: 0.88 mg/dL (ref 0.57–1.00)
GFR calc Af Amer: 94 mL/min/{1.73_m2} (ref >59)
GFR calc non Af Amer: 81 mL/min/{1.73_m2} (ref >59)
Glucose: 435 mg/dL — ABNORMAL HIGH (ref 65–99)
Potassium: 4.9 mmol/L (ref 3.5–5.2)
Sodium: 134 mmol/L (ref 134–144)

## 2020-07-11 LAB — HCV INTERPRETATION

## 2020-07-11 LAB — HCV AB W REFLEX TO QUANT PCR: HCV Ab: 0.2 s/co ratio (ref 0.0–0.9)

## 2020-07-12 DIAGNOSIS — E1140 Type 2 diabetes mellitus with diabetic neuropathy, unspecified: Secondary | ICD-10-CM | POA: Diagnosis not present

## 2020-07-13 DIAGNOSIS — E1140 Type 2 diabetes mellitus with diabetic neuropathy, unspecified: Secondary | ICD-10-CM | POA: Diagnosis not present

## 2020-07-14 DIAGNOSIS — E1140 Type 2 diabetes mellitus with diabetic neuropathy, unspecified: Secondary | ICD-10-CM | POA: Diagnosis not present

## 2020-07-14 DIAGNOSIS — M79671 Pain in right foot: Secondary | ICD-10-CM | POA: Diagnosis not present

## 2020-07-15 DIAGNOSIS — E1140 Type 2 diabetes mellitus with diabetic neuropathy, unspecified: Secondary | ICD-10-CM | POA: Diagnosis not present

## 2020-07-15 NOTE — Assessment & Plan Note (Signed)
Refer back to physical therapy Given rx for compression stockings
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
## 2020-07-15 NOTE — Assessment & Plan Note (Signed)
Doing well on home bipap Also doing well on torsemide, continue and check BMET Reschedule appointment with pulm

## 2020-07-15 NOTE — Addendum Note (Signed)
Addended by: [name] on: 07/15/2020 10:57 PM   Modules accepted: Orders

## 2020-07-16 ENCOUNTER — Telehealth: Payer: Medicaid Other

## 2020-07-16 DIAGNOSIS — E1140 Type 2 diabetes mellitus with diabetic neuropathy, unspecified: Secondary | ICD-10-CM | POA: Diagnosis not present

## 2020-07-17 ENCOUNTER — Other Ambulatory Visit: Payer: Self-pay

## 2020-07-17 ENCOUNTER — Ambulatory Visit (INDEPENDENT_AMBULATORY_CARE_PROVIDER_SITE_OTHER): Payer: Medicaid Other

## 2020-07-17 DIAGNOSIS — Z23 Encounter for immunization: Secondary | ICD-10-CM

## 2020-07-17 DIAGNOSIS — E1140 Type 2 diabetes mellitus with diabetic neuropathy, unspecified: Secondary | ICD-10-CM | POA: Diagnosis not present

## 2020-07-17 MED ORDER — HYDROCODONE-ACETAMINOPHEN 10-325 MG PO TABS: ORAL_TABLET | ORAL | 0 refills | Status: DC

## 2020-07-17 NOTE — Progress Notes (Signed)
Patient reports to nurse clinic for second covid immunization. Patient answered negative to all screening questions. Administered in right deltoid. Site unremarkable, tolerated well. Updated immunization record provided.   Observed in clinic for 15 minutes following administration. No adverse reaction noted.   Talbot Grumbling, RN
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
## 2020-07-18 ENCOUNTER — Encounter (HOSPITAL_COMMUNITY): Payer: Self-pay | Admitting: *Deleted

## 2020-07-18 ENCOUNTER — Other Ambulatory Visit: Payer: Self-pay

## 2020-07-18 ENCOUNTER — Emergency Department (HOSPITAL_COMMUNITY)
Admission: EM | Admit: 2020-07-18 | Discharge: 2020-07-18 | Disposition: A | Discharge status: Home / Self Care | Payer: Medicaid Other | Attending: Emergency Medicine | Admitting: Emergency Medicine

## 2020-07-18 ENCOUNTER — Emergency Department (HOSPITAL_COMMUNITY): Payer: Medicaid Other

## 2020-07-18 DIAGNOSIS — Z794 Long term (current) use of insulin: Secondary | ICD-10-CM | POA: Insufficient documentation

## 2020-07-18 DIAGNOSIS — E1165 Type 2 diabetes mellitus with hyperglycemia: Secondary | ICD-10-CM | POA: Diagnosis not present

## 2020-07-18 DIAGNOSIS — Z89512 Acquired absence of left leg below knee: Secondary | ICD-10-CM | POA: Diagnosis not present

## 2020-07-18 DIAGNOSIS — Z9119 Patient's noncompliance with other medical treatment and regimen: Secondary | ICD-10-CM | POA: Diagnosis not present

## 2020-07-18 DIAGNOSIS — Z7951 Long term (current) use of inhaled steroids: Secondary | ICD-10-CM | POA: Insufficient documentation

## 2020-07-18 DIAGNOSIS — R0602 Shortness of breath: Secondary | ICD-10-CM | POA: Diagnosis not present

## 2020-07-18 DIAGNOSIS — Z20822 Contact with and (suspected) exposure to covid-19: Secondary | ICD-10-CM | POA: Diagnosis not present

## 2020-07-18 DIAGNOSIS — Z9114 Patient's other noncompliance with medication regimen: Secondary | ICD-10-CM

## 2020-07-18 DIAGNOSIS — I10 Essential (primary) hypertension: Secondary | ICD-10-CM | POA: Insufficient documentation

## 2020-07-18 DIAGNOSIS — Z79899 Other long term (current) drug therapy: Secondary | ICD-10-CM | POA: Diagnosis not present

## 2020-07-18 DIAGNOSIS — Z87891 Personal history of nicotine dependence: Secondary | ICD-10-CM | POA: Insufficient documentation

## 2020-07-18 DIAGNOSIS — E13621 Other specified diabetes mellitus with foot ulcer: Secondary | ICD-10-CM | POA: Diagnosis not present

## 2020-07-18 DIAGNOSIS — R000 Tachycardia, unspecified: Secondary | ICD-10-CM | POA: Diagnosis not present

## 2020-07-18 DIAGNOSIS — J45909 Unspecified asthma, uncomplicated: Secondary | ICD-10-CM | POA: Diagnosis not present

## 2020-07-18 DIAGNOSIS — R52 Pain, unspecified: Secondary | ICD-10-CM | POA: Diagnosis not present

## 2020-07-18 DIAGNOSIS — R0902 Hypoxemia: Secondary | ICD-10-CM | POA: Diagnosis not present

## 2020-07-18 DIAGNOSIS — R739 Hyperglycemia, unspecified: Secondary | ICD-10-CM

## 2020-07-18 DIAGNOSIS — E1140 Type 2 diabetes mellitus with diabetic neuropathy, unspecified: Secondary | ICD-10-CM | POA: Diagnosis not present

## 2020-07-18 DIAGNOSIS — I471 Supraventricular tachycardia: Secondary | ICD-10-CM | POA: Diagnosis not present

## 2020-07-18 LAB — COMPREHENSIVE METABOLIC PANEL
ALT: 14 U/L (ref 0–44)
AST: 16 U/L (ref 15–41)
Albumin: 2.7 g/dL — ABNORMAL LOW (ref 3.5–5.0)
Alkaline Phosphatase: 124 U/L (ref 38–126)
Anion gap: 9 (ref 5–15)
BUN: 32 mg/dL — ABNORMAL HIGH (ref 6–20)
CO2: 26 mmol/L (ref 22–32)
Calcium: 8.7 mg/dL — ABNORMAL LOW (ref 8.9–10.3)
Chloride: 94 mmol/L — ABNORMAL LOW (ref 98–111)
Creatinine, Ser: 1.19 mg/dL — ABNORMAL HIGH (ref 0.44–1.00)
GFR calc Af Amer: >60 mL/min (ref >60)
GFR calc non Af Amer: 56 mL/min — ABNORMAL LOW (ref >60)
Glucose, Bld: 591 mg/dL (ref 70–99)
Potassium: 4.7 mmol/L (ref 3.5–5.1)
Sodium: 129 mmol/L — ABNORMAL LOW (ref 135–145)
Total Bilirubin: 0.4 mg/dL (ref 0.3–1.2)
Total Protein: 7.2 g/dL (ref 6.5–8.1)

## 2020-07-18 LAB — BRAIN NATRIURETIC PEPTIDE: B Natriuretic Peptide: 44.1 pg/mL (ref 0.0–100.0)

## 2020-07-18 LAB — CBC
HCT: 37.4 % (ref 36.0–46.0)
Hemoglobin: 12 g/dL (ref 12.0–15.0)
MCH: 29.1 pg (ref 26.0–34.0)
MCHC: 32.1 g/dL (ref 30.0–36.0)
MCV: 90.8 fL (ref 80.0–100.0)
Platelets: 330 10*3/uL (ref 150–400)
RBC: 4.12 MIL/uL (ref 3.87–5.11)
RDW: 14.2 % (ref 11.5–15.5)
WBC: 9.8 10*3/uL (ref 4.0–10.5)
nRBC: 0 % (ref 0.0–0.2)

## 2020-07-18 LAB — I-STAT BETA HCG BLOOD, ED (MC, WL, AP ONLY): I-stat hCG, quantitative: <5 m[IU]/mL (ref <5)

## 2020-07-18 LAB — CBG MONITORING, ED: Glucose-Capillary: 539 mg/dL (ref 70–99)

## 2020-07-18 LAB — D-DIMER, QUANTITATIVE: D-Dimer, Quant: 0.39 ug/mL-FEU (ref 0.00–0.50)

## 2020-07-18 LAB — SARS CORONAVIRUS 2 BY RT PCR (HOSPITAL ORDER, PERFORMED IN ~~LOC~~ HOSPITAL LAB): SARS Coronavirus 2: NEGATIVE

## 2020-07-18 MED ORDER — INSULIN ASPART 100 UNIT/ML ~~LOC~~ SOLN: 8.0000 [IU] | Freq: Once | SUBCUTANEOUS | Status: DC

## 2020-07-18 MED ORDER — SODIUM CHLORIDE 0.9 % IV BOLUS
500.0000 mL | Freq: Once | INTRAVENOUS | Status: AC
  Administered 2020-07-18: 500 mL via INTRAVENOUS

## 2020-07-18 NOTE — ED Provider Notes (Signed)
[facility] EMERGENCY DEPARTMENT Provider Note   CSN: [id number] Arrival date & time: 07/18/20  1338     History Chief Complaint  Patient presents with  . Tachycardia    SVT    [name] is a 44 y.o. female.  HPI 44 year old female history of SVT, EMS called out today regarding tachycardia.  Initial heart rate 180.  Patient received adenosine and converted to normal sinus rhythm.  Patient is morbidly obese, has type 2 diabetes, multiple extremity amputations, hypertension, hypoventilation syndrome, bipolar disorder, and reports that her blood sugar was elevated last night.  This a.m. she awoke.  She took p.o. food, but forgot to take her insulin.  She went back to sleep and awoke around 1230.  At that time she was short of breath and noted that her heart rate was high and her oxygen level low.  EMS was called and she received the above treatment.  Patient reports that she completed her Covid vaccination last week.  She denies any known exposures to Covid.  She denies any runny nose, cough, congestion, fever, or chills.  She reports that her right foot is a partial amputation and she has been having some ongoing problems with that but it is at baseline.     Past Medical History:  Diagnosis Date  . Acute osteomyelitis of ankle or foot, left ([place]) 01/31/2018  . Alveolar hypoventilation   . Anemia    not on iron pill  . Asthma   . Bipolar 2 disorder ([place])   . Carpal tunnel syndrome on right    recurrent  . Cellulitis 08/2010-08/2011  . Chronic pain   . COPD (chronic obstructive pulmonary disease) ([name])    Symbicort daily and Proventil as needed  . Costochondritis   . Diabetes mellitus type II, uncontrolled ([place]) 2000   Type 2, Uncontrolled.Takes Lantus daily.Fasting blood sugar runs 150  . Drug-seeking behavior   . GERD (gastroesophageal reflux disease)    takes Pantoprazole and Zantac daily  . HLD (hyperlipidemia)    takes Atorvastatin daily  . Hypertension      takes Lisinopril and Coreg daily  . Morbid obesity ([place])   . Nocturia   . OSA on CPAP   . Peripheral neuropathy    takes Gabapentin daily  . Pneumonia    "walking" several yrs ago and as a baby (12/05/2018)  . Rectal fissure   . Restless leg   . SVT (supraventricular tachycardia) ([place])   . Syncope 02/25/2016  . Urinary frequency   . Varicose veins    Right medial thigh and Left leg     Patient Active Problem List   Diagnosis Date Noted  . Acute respiratory failure with hypoxia and hypercarbia ([place]) 06/15/2020  . Acute respiratory failure with hypoxia ([place]) 06/14/2020  . Blister of foot 04/21/2020  . Exposure to mold 04/21/2020  . Constipation 04/03/2020  . Left shoulder pain 06/23/2019  . 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[place]) 04/11/2019  . Allergic rhinitis 03/06/2018  . Class 3 severe obesity due to excess calories with serious comorbidity and body mass index (BMI) of 50.0 to 59.9 in adult [facility])   . Decreased pedal pulses 06/10/2017  . Unilateral primary osteoarthritis, right knee 10/22/2016  . Primary osteoarthritis of first carpometacarpal joint of left hand 07/30/2016  . Diabetic neuropathy ([place]) 07/14/2016  . Nausea 03/17/2016  . De Quervain's tenosynovitis, bilateral 11/01/2015  . Vitamin D deficiency 09/05/2015  . Recurrent candidiasis of vagina 09/05/2015  . Varicose veins of  leg with complications 45/40/9811  . Restless leg syndrome 10/17/2014  . Chronic sinusitis 07/18/2014  . Encounter for chronic pain management 06/30/2013  . HLD (hyperlipidemia) 11/19/2012  . Chest pain 06/27/2012  . Right carpal tunnel syndrome 09/01/2011  . Bilateral knee pain 09/01/2011  . DM (diabetes mellitus) type II uncontrolled, periph vascular disorder ([place]) 05/22/2008  . Morbid obesity ([place]) 05/22/2008  . Obesity hypoventilation syndrome ([place]) 05/22/2008  . Depression 05/22/2008  . Obstructive sleep apnea 05/22/2008  . Hypertension 05/22/2008  . Asthma 05/22/2008  . GERD 05/22/2008     Past Surgical History:  Procedure Laterality Date  . AMPUTATION Left 02/01/2018   Procedure: LEFT FOURTH AND 5TH TOE Deem Marmol AMPUTATION;  Surgeon: [name], MD;  Location: [facility];  Service: Orthopedics;  Laterality: Left;  . AMPUTATION Left 03/03/2018   Procedure: LEFT BELOW KNEE AMPUTATION;  Surgeon: [name], MD;  Location: [facility];  Service: Orthopedics;  Laterality: Left;  . CARPAL TUNNEL RELEASE Bilateral   . CESAREAN SECTION  2007  . INCISION AND DRAINAGE PERIRECTAL ABSCESS Left 05/18/2019   Procedure: IRRIGATION AND DEBRIDEMENT OF PANNIS ABSCESS, POSSIBLE DEBRIDEMENT OF BUTTOCK WOUND;  Surgeon: Donnie Mesa, MD;  Location: [facility];  Service: General;  Laterality: Left;  . IRRIGATION AND DEBRIDEMENT BUTTOCKS Left 05/17/2019   Procedure: IRRIGATION AND DEBRIDEMENT BUTTOCKS;  Surgeon: Donnie Mesa, MD;  Location: [facility];  Service: General;  Laterality: Left;  . KNEE ARTHROSCOPY Right 07/17/2010  . LEFT HEART CATHETERIZATION WITH CORONARY ANGIOGRAM N/A 07/27/2012   Procedure: LEFT HEART CATHETERIZATION WITH CORONARY ANGIOGRAM;  Surgeon: Sherren Mocha, MD;  Location: [facility] CATH LAB;  Service: Cardiovascular;  Laterality: N/A;  . MASS EXCISION N/A 06/29/2013   Procedure:  WIDE LOCAL EXCISION OF POSTERIOR NECK ABSCESS;  Surgeon: Ralene Ok, MD;  Location: [facility];  Service: General;  Laterality: N/A;  . REPAIR KNEE LIGAMENT Left    "fixed ligaments and chipped patella"  . right transmetatarsal amputation        OB History    Gravida  2   Para  1   Term      Preterm      AB  1   Living  1     SAB  1   TAB      Ectopic      Multiple      Live Births              Family History  Problem Relation Age of Onset  . Diabetes Mother   . Hyperlipidemia Mother   . Depression Mother   . GER disease Mother   . Allergic rhinitis Mother   . Restless legs syndrome Mother   . Heart attack Paternal Uncle   . Heart disease Paternal Grandmother   . Heart attack Paternal  Grandmother   . Heart attack Paternal Grandfather   . Heart disease Paternal Grandfather   . Heart attack Father   . Migraines Sister   . Cancer Maternal Grandmother        COLON  . Hypertension Maternal Grandmother   . Hyperlipidemia Maternal Grandmother   . Diabetes Maternal Grandmother   . Other Maternal Grandfather        GUN SHOT  . Anxiety disorder Sister     Social History   Tobacco Use  . Smoking status: Former Smoker    Packs/day: 0.30    Years: 0.30    Pack years: 0.09    Types: Cigarettes  Quit date: 12/06/1993    Years since quitting: 26.6  . Smokeless tobacco: Never Used  Vaping Use  . Vaping Use: Never used  Substance Use Topics  . Alcohol use: No    Alcohol/week: 0.0 standard drinks  . Drug use: Yes    Comment: OD attempts on home meds      Home Medications Prior to Admission medications   Medication Sig Start Date End Date Taking? Authorizing Provider  Accu-Chek Softclix Lancets lancets Use to check blood glucose four times daily 04/17/20   Martyn Malay, MD  amLODipine (NORVASC) 10 MG tablet Take 1 tablet (10 mg total) by mouth daily. 05/25/19   [name], MD  atorvastatin (LIPITOR) 40 MG tablet Take 1 tablet (40 mg total) by mouth daily. 04/11/20   [name], MD  benzonatate (TESSALON) 100 MG capsule Take 1 capsule (100 mg total) by mouth 3 (three) times daily as needed for cough. Patient not taking: Reported on 06/26/2020 06/26/20   [name], MD  budesonide-formoterol [facility]) 160-4.5 MCG/ACT inhaler Inhale 2 puffs into the lungs 2 (two) times daily. 05/09/20   [name], MD  Cholecalciferol 1000 units tablet Take 1 tablet (1,000 Units total) by mouth daily. 04/05/17   [name], MD  Compression Bandages MISC Wear daily for swelling 07/10/20   [name], MD  Continuous Blood Gluc Sensor (DEXCOM G6 SENSOR) MISC Inject 1 applicator into the skin as directed. Change sensor every 10 days. 04/10/20   Leavy Cella, RPH-CPP  Continuous Blood Gluc Transmit (DEXCOM G6 TRANSMITTER) MISC Inject 1 Device into the skin as directed. Reuse 8 times with sensor changes. 04/10/20   Leavy Cella, RPH-CPP  gabapentin (NEURONTIN) 600 MG tablet TAKE 2 TABLETS BY MOUTH 3 TIMES DAILY 05/09/20   [name], MD  glucose blood (ACCU-CHEK GUIDE) test strip USE T0 CHECK BLOOD SUGAR 3 TIMES DAILY 04/18/20   Leavy Cella, RPH-CPP  HYDROcodone-acetaminophen (NORCO) 10-325 MG tablet TAKE 1 TABLET BY MOUTH EVERY EIGHT HOURS AS NEEDED 07/04/20   [name], MD  HYDROcodone-acetaminophen (NORCO) 10-325 MG tablet TAKE 1 TABLET BY MOUTH EVERY EIGHT HOURS AS NEEDED 07/17/20   [name], MD  insulin isophane & regular human (HUMULIN 70/30 MIX) (70-30) 100 UNIT/ML KwikPen Inject 110 Units into the skin daily with breakfast AND 90 Units daily with supper. 06/23/20   Patriciaann Clan, DO  Insulin Pen Needle (SURE COMFORT PEN NEEDLES) 32G X 4 MM MISC Use to inject insulin as indicated 04/17/20   Martyn Malay, MD  ipratropium-albuterol (DUONEB) 0.5-2.5 (3) MG/3ML SOLN Take 3 mLs by nebulization every 4 (four) hours as needed. Patient not taking: Reported on 06/26/2020 06/25/20   [name], MD  Lancets (ACCU-CHEK MULTICLIX) lancets 1 each by Other route as needed for other. Use as instructed 04/18/20   Leavy Cella, RPH-CPP  metoprolol succinate (TOPROL XL) 25 MG 24 hr tablet Take 1 tablet (25 mg total) by mouth daily. 05/28/20 05/23/21  Sherren Mocha, MD  Misc. Devices (PULSE OXIMETER FOR FINGER) MISC Use as needed to check pulse oxymetry 06/20/20   [name], MD  Misc. Devices MISC Shrinkers and liners for prosthesis 06/20/20   [name], MD  pantoprazole (PROTONIX) 40 MG tablet Take 1 tablet (40 mg total) by mouth daily. 03/16/18   Angiulli, Lavon Paganini, PA-C  PROVENTIL HFA 108 (90 Base) MCG/ACT inhaler Inhale 1-2 puffs into the lungs every 6 (  six) hours as needed for wheezing or  shortness of breath. Patient taking differently: Inhale 2 puffs into the lungs every 4 (four) hours as needed for wheezing.  05/11/19   [name], MD  rOPINIRole (REQUIP) 1 MG tablet TAKE 1 TABLET BY MOUTH AT BEDTIME 07/16/20   [name], MD  sodium chloride (OCEAN) 0.65 % SOLN nasal spray Place 1 spray into both nostrils as needed for congestion. 06/23/20   Patriciaann Clan, DO  torsemide (DEMADEX) 20 MG tablet Take 1 tablet (20 mg total) by mouth daily. Patient not taking: Reported on 06/26/2020 06/26/20   [name], MD  VICTOZA 18 MG/3ML SOPN inject 0.6 MG SUBCUTANEOUSLY ONCE A DAY FOR 1 WEEK, THEN INCREASE TO 1.2 MG ONCE daily max 1.8 MG Patient taking differently: Inject 1.2 mg into the skin daily.  04/16/20   [name], MD    Allergies    Cefepime, Kiwi extract, Morphine and related, Trental [pentoxifylline], and Nubain [nalbuphine hcl]  Review of Systems   Review of Systems  All other systems reviewed and are negative.   Physical Exam Updated Vital Signs BP 111/78 (BP Location: Right Arm)   Pulse 98   Temp 98.1 F (36.7 C) (Oral)   Resp 18   Wt (!) 165.1 kg   SpO2 98%   BMI 66.58 kg/m   Physical Exam Vitals and nursing note reviewed.  Constitutional:      General: She is in acute distress.     Appearance: Normal appearance. She is obese. She is not ill-appearing.  HENT:     Head: Normocephalic.     Right Ear: External ear normal.     Left Ear: External ear normal.     Nose: Nose normal.     Mouth/Throat:     Mouth: Mucous membranes are moist.  Eyes:     Pupils: Pupils are equal, round, and reactive to light.  Cardiovascular:     Rate and Rhythm: Normal rate and regular rhythm.     Pulses: Normal pulses.     Heart sounds: Normal heart sounds.  Pulmonary:     Effort: Pulmonary effort is normal.     Breath sounds: Normal breath sounds.  Abdominal:     General: Abdomen is flat.     Palpations: Abdomen is soft.    Musculoskeletal:        General: Normal range of motion.     Cervical back: Normal range of motion.     Comments: Partial amputation right foot Left BKA  Skin:    General: Skin is warm.     Capillary Refill: Capillary refill takes less than 2 seconds.  Neurological:     General: No focal deficit present.     Mental Status: She is alert.  Psychiatric:        Mood and Affect: Mood normal.     ED Results / Procedures / Treatments   Labs (all labs ordered are listed, but only abnormal results are displayed) Labs Reviewed  CBG MONITORING, ED - Abnormal; Notable for the following components:      Result Value   Glucose-Capillary 539 (*)    All other components within normal limits  SARS CORONAVIRUS 2 BY RT PCR (HOSPITAL ORDER, [facility])  CBC  COMPREHENSIVE METABOLIC PANEL  D-DIMER, QUANTITATIVE (NOT AT [facility])  BRAIN NATRIURETIC PEPTIDE  I-STAT BETA HCG BLOOD, ED ([facility], [facility], [facility] ONLY)    EKG EKG Interpretation  Date/Time:  Friday July 18 2020 13:46:35 EDT Ventricular Rate:  98 PR Interval:    QRS Duration: 100 QT Interval:  368 QTC Calculation: 470 R Axis:   112 Text Interpretation: Normal sinus rhythm Non-specific ST-t changes No significant change since last tracing Confirmed by Pattricia Boss [phone]) on 07/18/2020 2:52:18 PM   Radiology DG Chest Port 1 View  Result Date: 07/18/2020 CLINICAL DATA:  Shortness of breath.  Supraventricular tachycardia EXAM: PORTABLE CHEST 1 VIEW COMPARISON:  Chest CT June 14, 2020 and chest radiograph June 18, 2020 FINDINGS: There is no edema or airspace opacity. Heart is upper normal in size with pulmonary vascularity normal. No pneumothorax. No adenopathy. No bone lesions. IMPRESSION: No edema or airspace opacity.  Stable cardiac silhouette. Electronically Signed   By: Lowella Grip III M.D.   On: 07/18/2020 14:09    Procedures Procedures (including critical care time)  Medications Ordered in  ED Medications - No data to display  ED Course  I have reviewed the triage vital signs and the nursing notes.  Pertinent labs & imaging results that were available during my care of the patient were reviewed by me and considered in my medical decision making (see chart for details).  Clinical Course as of Jul 18 1357  Fri Jul 18, 2020  1350 Glucose-Capillary(!!): 539 [AS]    Clinical Course User Index [AS] Burke Keels, [place]   MDM Rules/Calculators/A&P                           1- svt- resolved after adenosine 2- dyspnea-sxs resolved with heart rate resolution.  Patient on home oxygen, but prn.  May d/c 3- hyperglycemia-patient states her blood sugar is high when she doesn't take her insulin and eats.  No anion gap.  2:58 PM Patient states she feels back to baseline Patient appears stable for discharge  Final Clinical Impression(s) / ED Diagnoses Final diagnoses:  SVT (supraventricular tachycardia) ([place])  Hyperglycemia  Noncompliance with medication regimen    Rx / DC Orders ED Discharge Orders    None       Pattricia Boss, MD 07/18/20 1501

## 2020-07-18 NOTE — ED Triage Notes (Signed)
Pt by [facility] from home due to SVT.  Pt states that she felt her heart racing 30 minutes pta.  EMS foujnd her to be SVT in 160's. Pt had 18g placed in left upper arm by ems and was given 6mg  adenosine and converted to sinus tach.  Pt had her second covid vaccine yesterday.   Pt has had sob with this today (pt has CHF)

## 2020-07-18 NOTE — Discharge Instructions (Addendum)
Please follow your blood sugar and take insulin as prescribed. Return to the ED if worse at any time.

## 2020-07-18 NOTE — ED Notes (Signed)
CBG 539 RN notified

## 2020-07-19 DIAGNOSIS — E1140 Type 2 diabetes mellitus with diabetic neuropathy, unspecified: Secondary | ICD-10-CM | POA: Diagnosis not present

## 2020-07-20 DIAGNOSIS — E1140 Type 2 diabetes mellitus with diabetic neuropathy, unspecified: Secondary | ICD-10-CM | POA: Diagnosis not present

## 2020-07-21 ENCOUNTER — Institutional Professional Consult (permissible substitution): Payer: Medicaid Other | Admitting: Pulmonary Disease

## 2020-07-21 ENCOUNTER — Telehealth: Payer: Self-pay | Admitting: *Deleted

## 2020-07-21 ENCOUNTER — Telehealth: Payer: Medicaid Other

## 2020-07-21 ENCOUNTER — Other Ambulatory Visit: Payer: Self-pay

## 2020-07-21 ENCOUNTER — Telehealth: Payer: Self-pay

## 2020-07-21 DIAGNOSIS — E1140 Type 2 diabetes mellitus with diabetic neuropathy, unspecified: Secondary | ICD-10-CM | POA: Diagnosis not present

## 2020-07-21 NOTE — Telephone Encounter (Signed)
  Care Management   Outreach Note  07/21/2020 Name: Dominigue Gellner MRN: [id number] DOB: [birth year]  Referred by: [name], MD    An unsuccessful telephone outreach was attempted today. The patient was referred to the case management team for assistance with care management and care coordination.   Follow Up Plan: A HIPPA compliant phone message was left for the patient providing contact information and requesting a return call.  The care management team will reach out to the patient again over the next 3-5 days.   Lazaro Arms RN, BSN, [facility] Care Management Coordinator [place] Phone: [phone] Fax: [phone]

## 2020-07-21 NOTE — Telephone Encounter (Signed)
Patient followed by Chronic Crre  Management. Patient scheduled for meeting with them on 07/21/20. Will route to them for transition of care assessment completion.  Lenor Coffin, RN, BSN, [place] [facility] [place] [phone]

## 2020-07-22 DIAGNOSIS — E1140 Type 2 diabetes mellitus with diabetic neuropathy, unspecified: Secondary | ICD-10-CM | POA: Diagnosis not present

## 2020-07-23 DIAGNOSIS — E1140 Type 2 diabetes mellitus with diabetic neuropathy, unspecified: Secondary | ICD-10-CM | POA: Diagnosis not present

## 2020-07-24 ENCOUNTER — Telehealth: Payer: Self-pay

## 2020-07-24 DIAGNOSIS — E1140 Type 2 diabetes mellitus with diabetic neuropathy, unspecified: Secondary | ICD-10-CM | POA: Diagnosis not present

## 2020-07-24 NOTE — Telephone Encounter (Signed)
Received fax from pharmacy, PA needed on Dexcom. Clinical questions submitted via Cover My Meds. Waiting on response, could take up to 72 hours.  Cover My Meds info: Key: [id number]

## 2020-07-24 NOTE — Telephone Encounter (Signed)
Spoke with Patient she is rescheduled for telephone call with RN CM on 07/30/2020

## 2020-07-25 ENCOUNTER — Telehealth: Payer: Self-pay | Admitting: Family Medicine

## 2020-07-25 DIAGNOSIS — E1140 Type 2 diabetes mellitus with diabetic neuropathy, unspecified: Secondary | ICD-10-CM | POA: Diagnosis not present

## 2020-07-25 DIAGNOSIS — G4733 Obstructive sleep apnea (adult) (pediatric): Secondary | ICD-10-CM | POA: Diagnosis not present

## 2020-07-25 DIAGNOSIS — E669 Obesity, unspecified: Secondary | ICD-10-CM | POA: Diagnosis not present

## 2020-07-25 DIAGNOSIS — J45909 Unspecified asthma, uncomplicated: Secondary | ICD-10-CM | POA: Diagnosis not present

## 2020-07-25 DIAGNOSIS — M86172 Other acute osteomyelitis, left ankle and foot: Secondary | ICD-10-CM | POA: Diagnosis not present

## 2020-07-25 NOTE — Telephone Encounter (Signed)
Emergency After-Hours Line:  Patient calls emergency after-hours line around 2:30am for elevated blood sugar of 540. She notes that she takes 70/30 at home, 110U in AM, 90U in PM. She checked her BS at midnight and it was 590. She took an additional 40U of her 70/30 at that time. Her blood sugar only dropped to 540 after 2 hours. She endorsers feeling a little off but denies any nausea/vomiting. Had patient check her blood sugar during encounter and it was 511. Reluctant to have patient take more insulin at this time. Recommended she continue to monitor and if persistently stays high, develops nausea/vomiting, abdominal pain or inability to tolerate PO recommended she go to the ED for further evaluation and treatment.   Spoke to patient again at 0630 and notes her BS is now 33. She is resting well at this time. Urged her to contact clinic to schedule appt with PCP. She voiced understanding and agreement with plan.  Mina Marble, DO [facility], PGY3 07/25/2020 7:04 AM

## 2020-07-28 DIAGNOSIS — E1140 Type 2 diabetes mellitus with diabetic neuropathy, unspecified: Secondary | ICD-10-CM | POA: Diagnosis not present

## 2020-07-28 NOTE — Telephone Encounter (Signed)
Dexom approved via covermymeds. The pharmacy has been updated and they informed they have already given her supplies.

## 2020-07-29 ENCOUNTER — Other Ambulatory Visit: Payer: Self-pay | Admitting: Family Medicine

## 2020-07-29 DIAGNOSIS — E1140 Type 2 diabetes mellitus with diabetic neuropathy, unspecified: Secondary | ICD-10-CM | POA: Diagnosis not present

## 2020-07-30 ENCOUNTER — Other Ambulatory Visit: Payer: Self-pay

## 2020-07-30 ENCOUNTER — Ambulatory Visit: Payer: Medicaid Other

## 2020-07-30 DIAGNOSIS — E1140 Type 2 diabetes mellitus with diabetic neuropathy, unspecified: Secondary | ICD-10-CM | POA: Diagnosis not present

## 2020-07-31 IMAGING — CR LEFT SHOULDER - 2+ VIEW
3 series · 3 of 3 positions shown · non-contrast
Comparison: None.

CLINICAL DATA: Atraumatic left shoulder pain

EXAM:
LEFT SHOULDER - 2+ VIEW

[x shoulder ap left (1 of 3)]
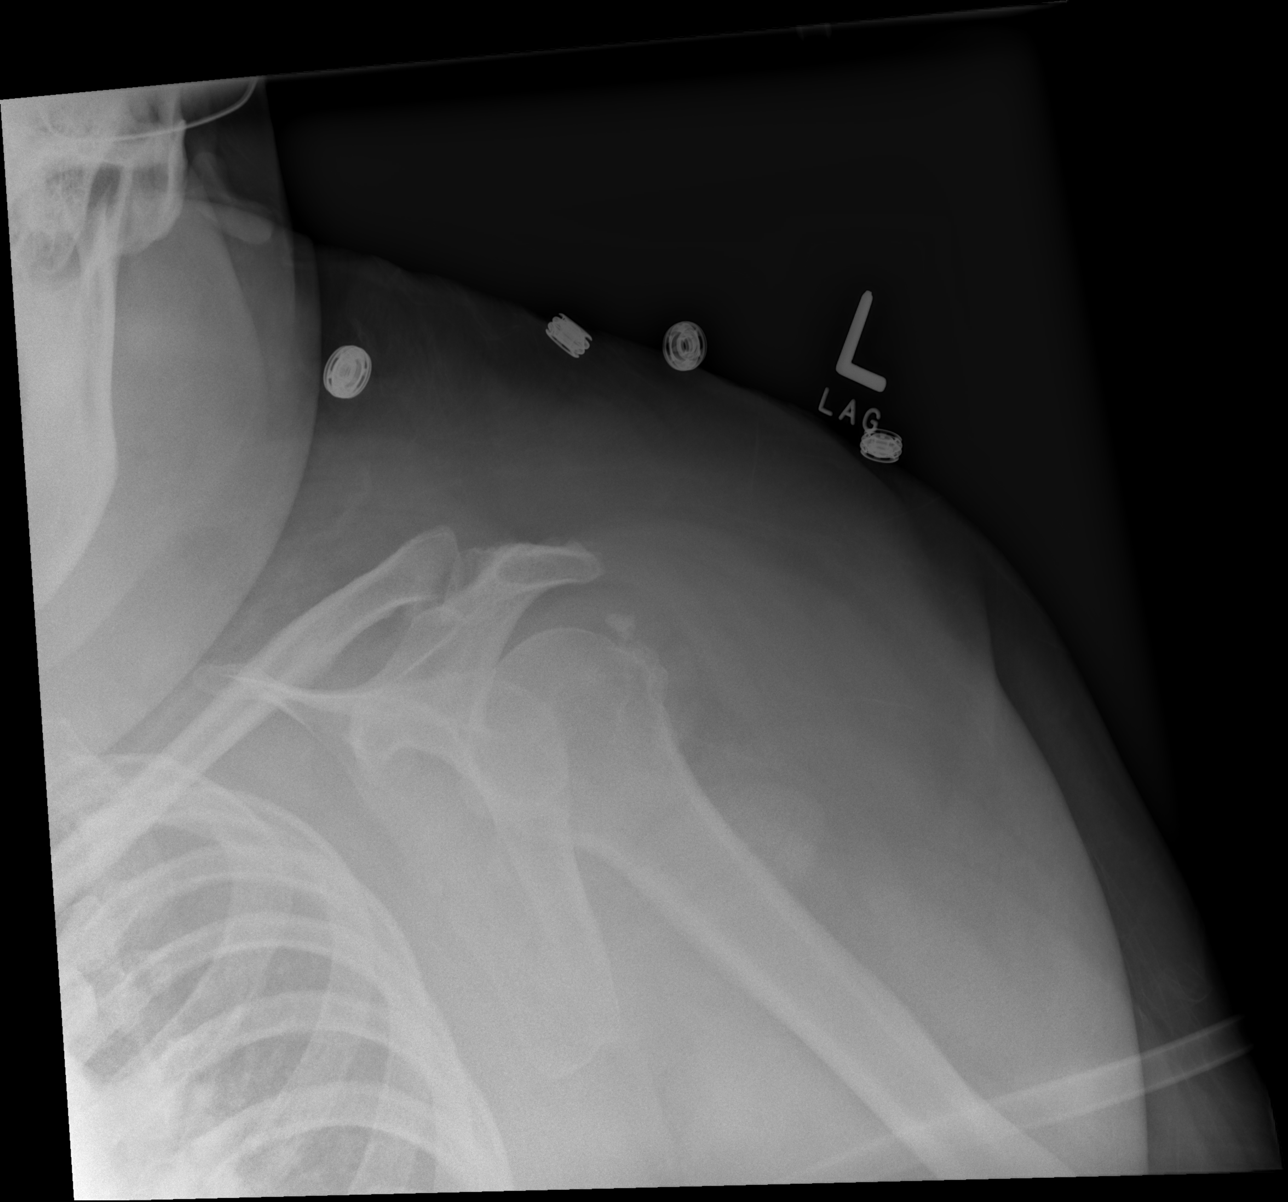

[x shoulder ap left (2 of 3)]
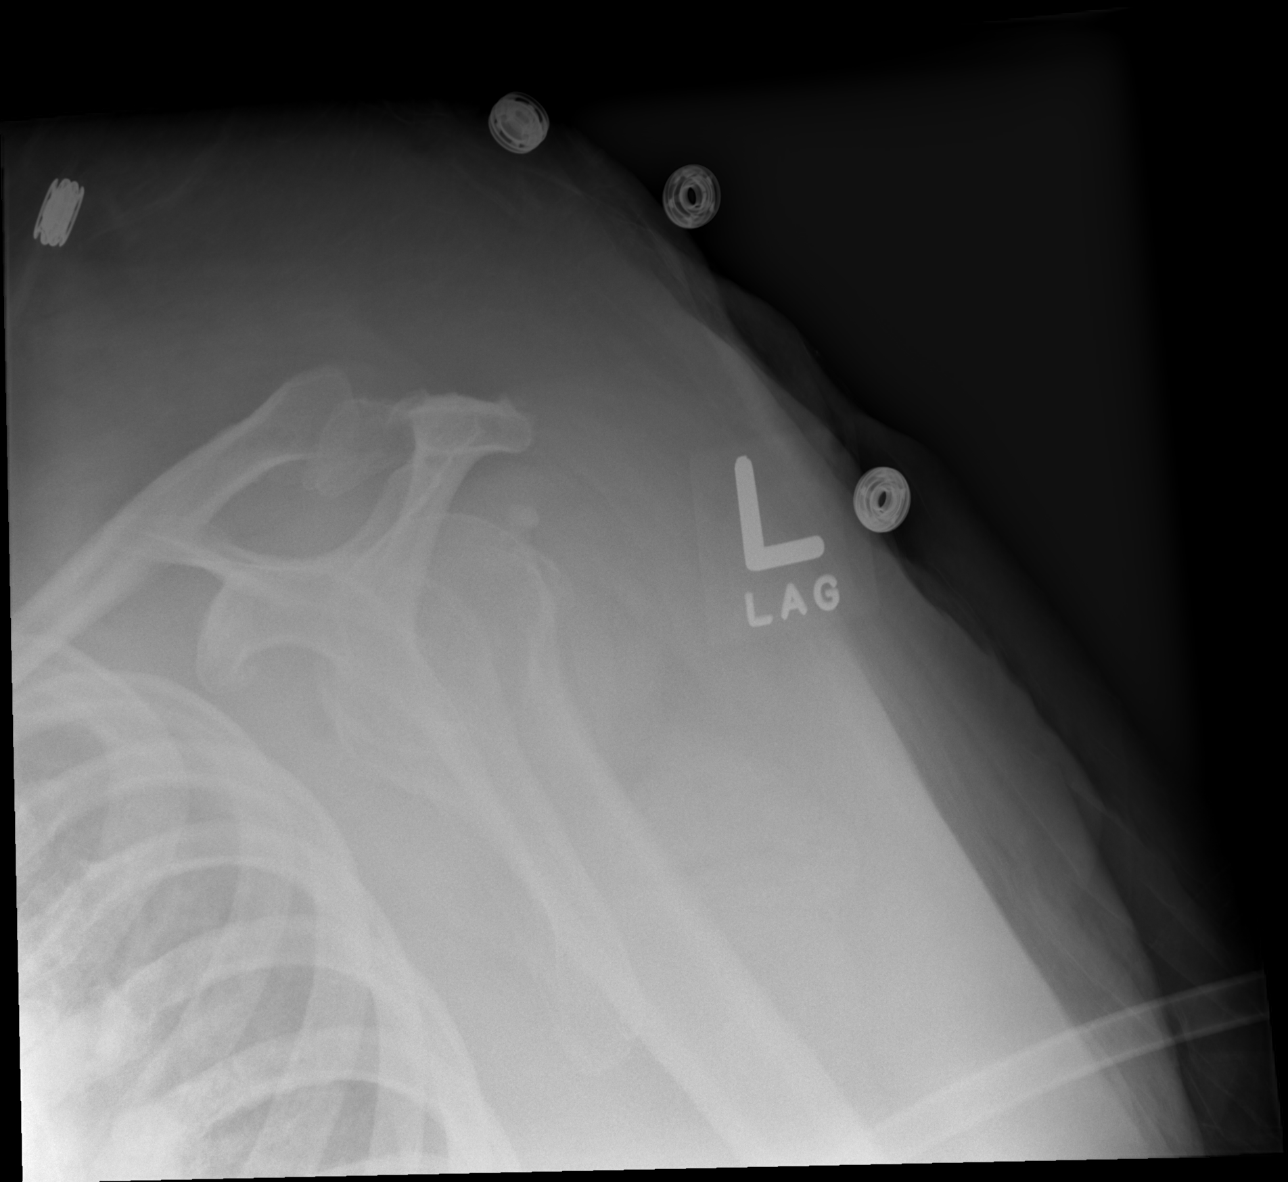

[x shoulder ap left (3 of 3)]
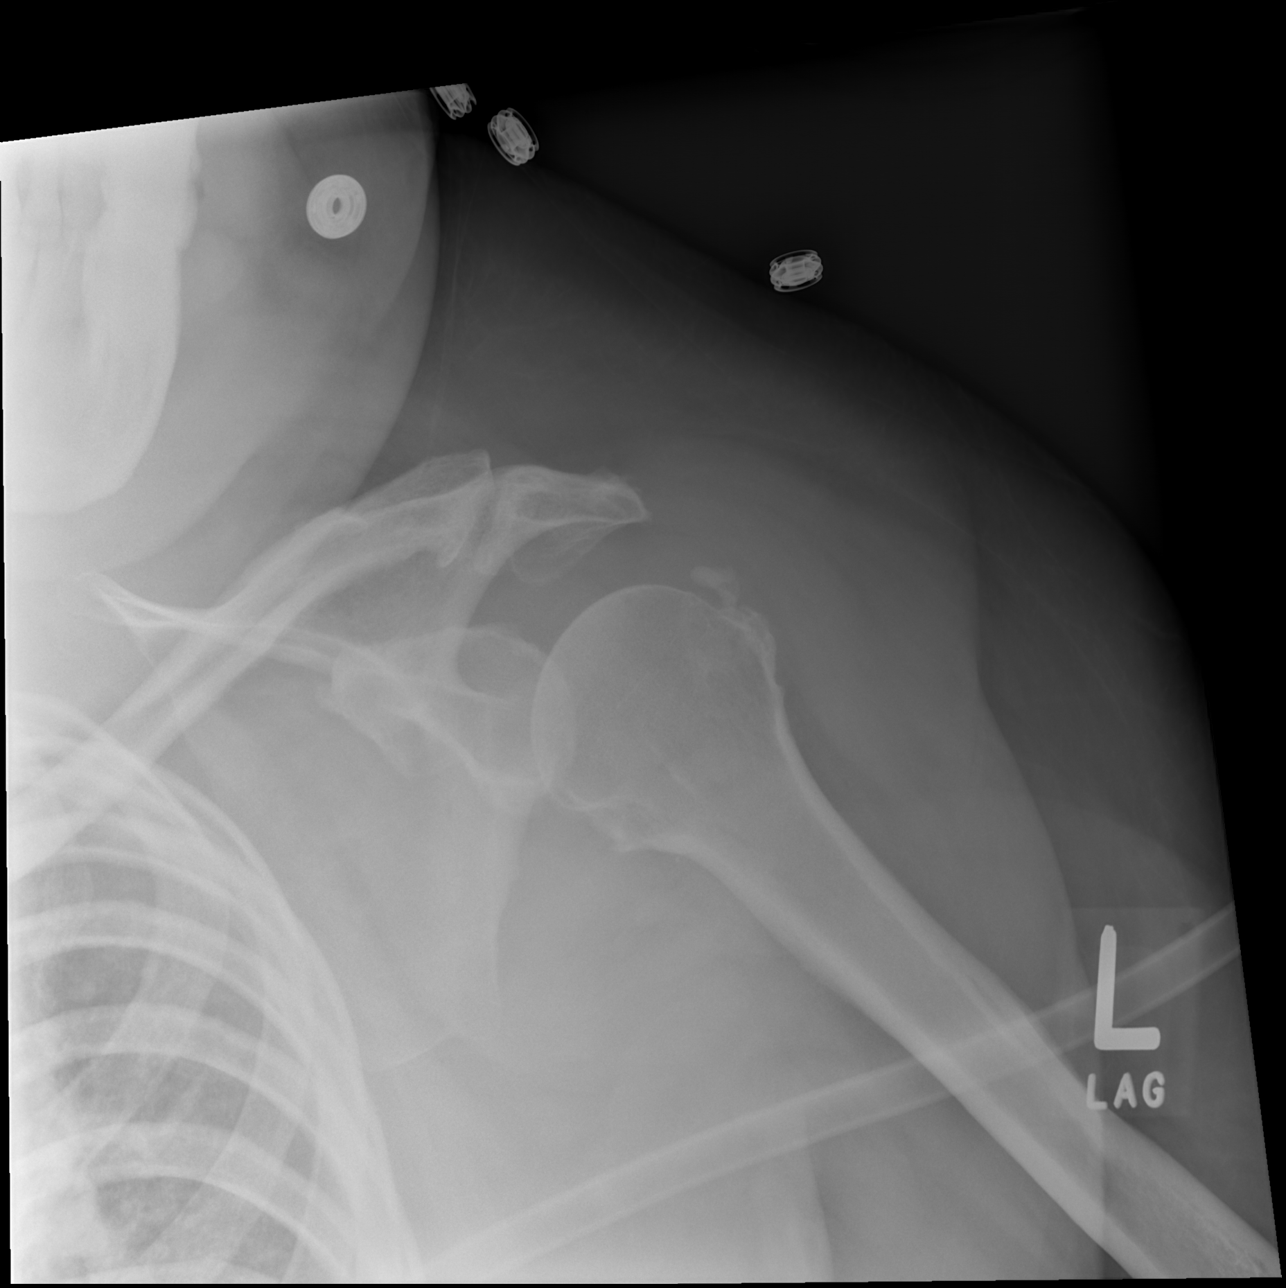

[3 of 3 positions shown; findings below may reference images not displayed]

FINDINGS: Nodular calcification at the rotator cuff. Degenerative spurring at
the AC joint projecting inferiorly. Enthesophyte formation also seen
at the greater tuberosity. No fracture or erosion.
IMPRESSION: 1. Calcific tendinitis of the rotator cuff.
2. Acromioclavicular degenerative spurring.

## 2020-07-31 IMAGING — CR RIGHT WRIST - COMPLETE 3+ VIEW
4 series · 4 of 4 positions shown · non-contrast
Comparison: None.

CLINICAL DATA: Atraumatic right hand and wrist pain

EXAM:
RIGHT WRIST - COMPLETE 3+ VIEW

[x wrist pa right]
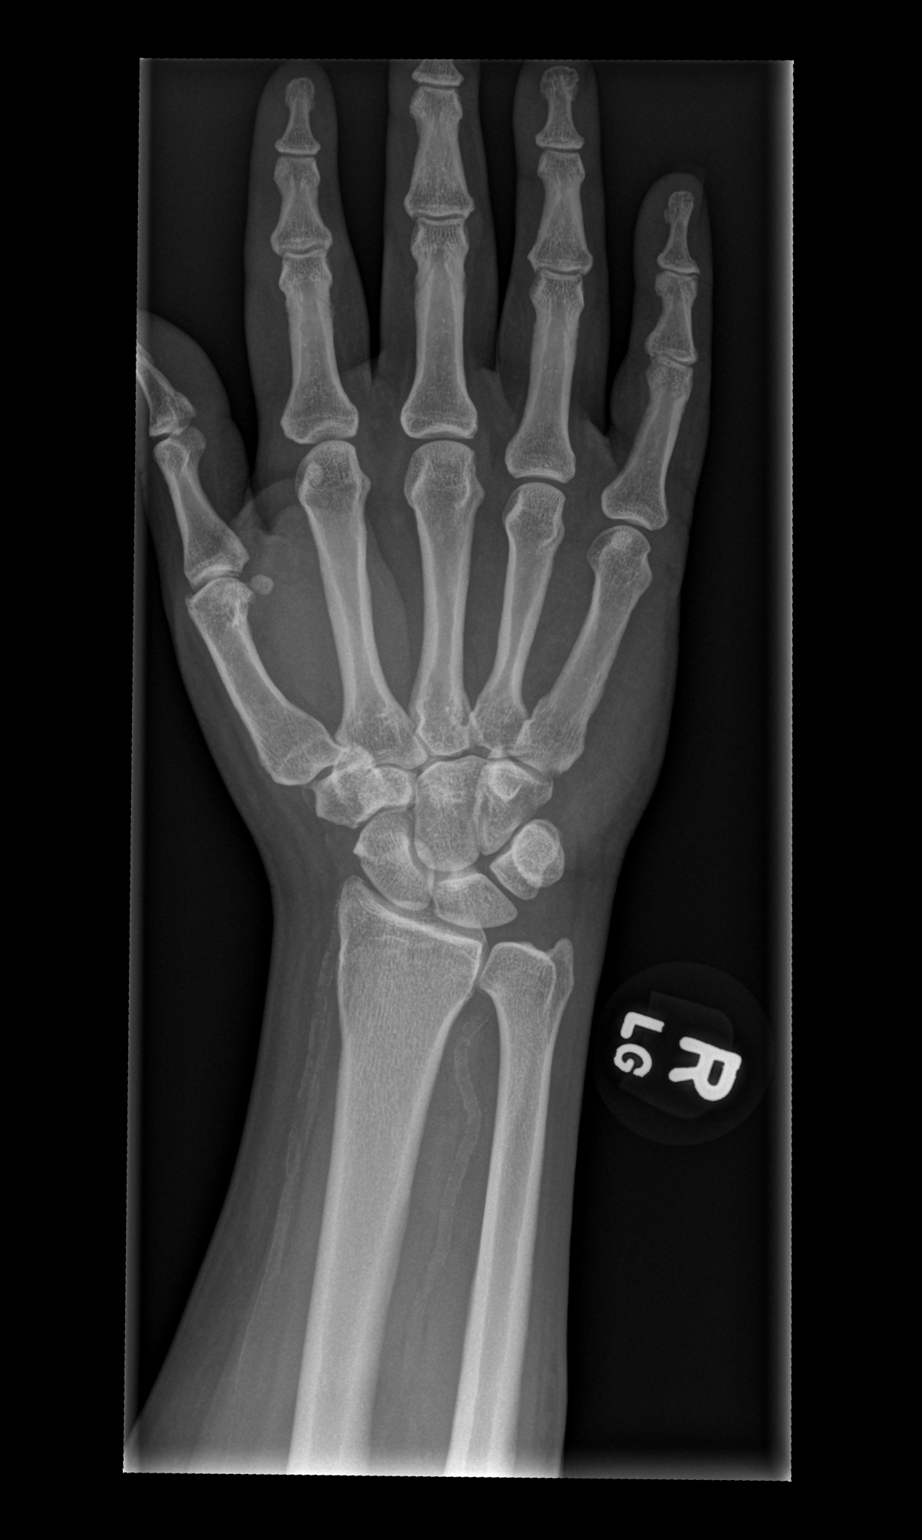

[x wrist obl right]
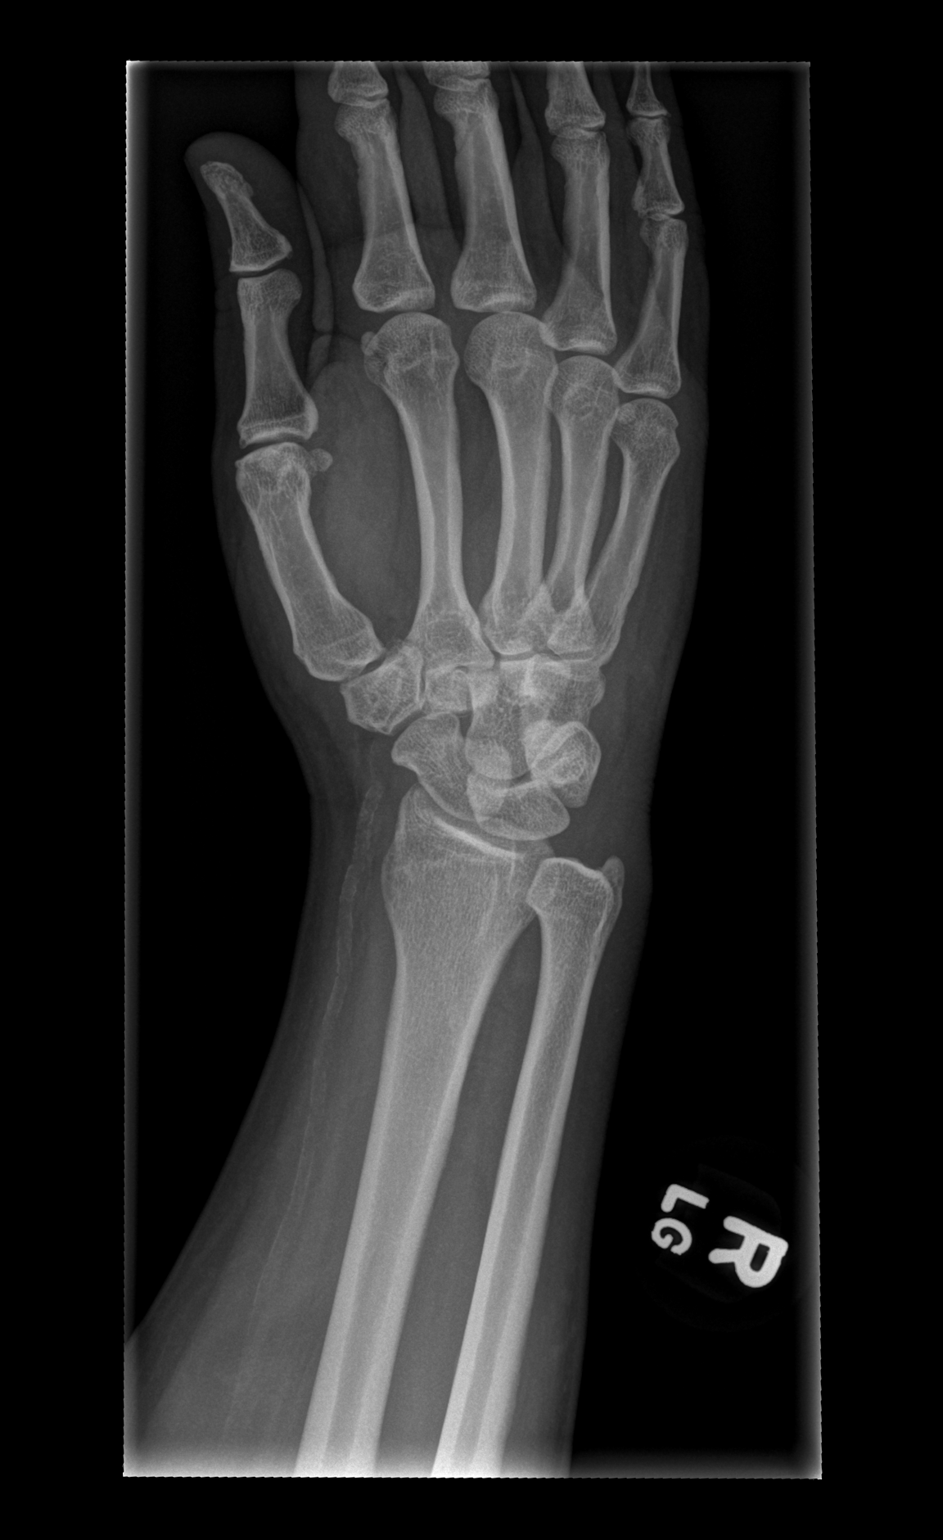

[x wrist lat right]
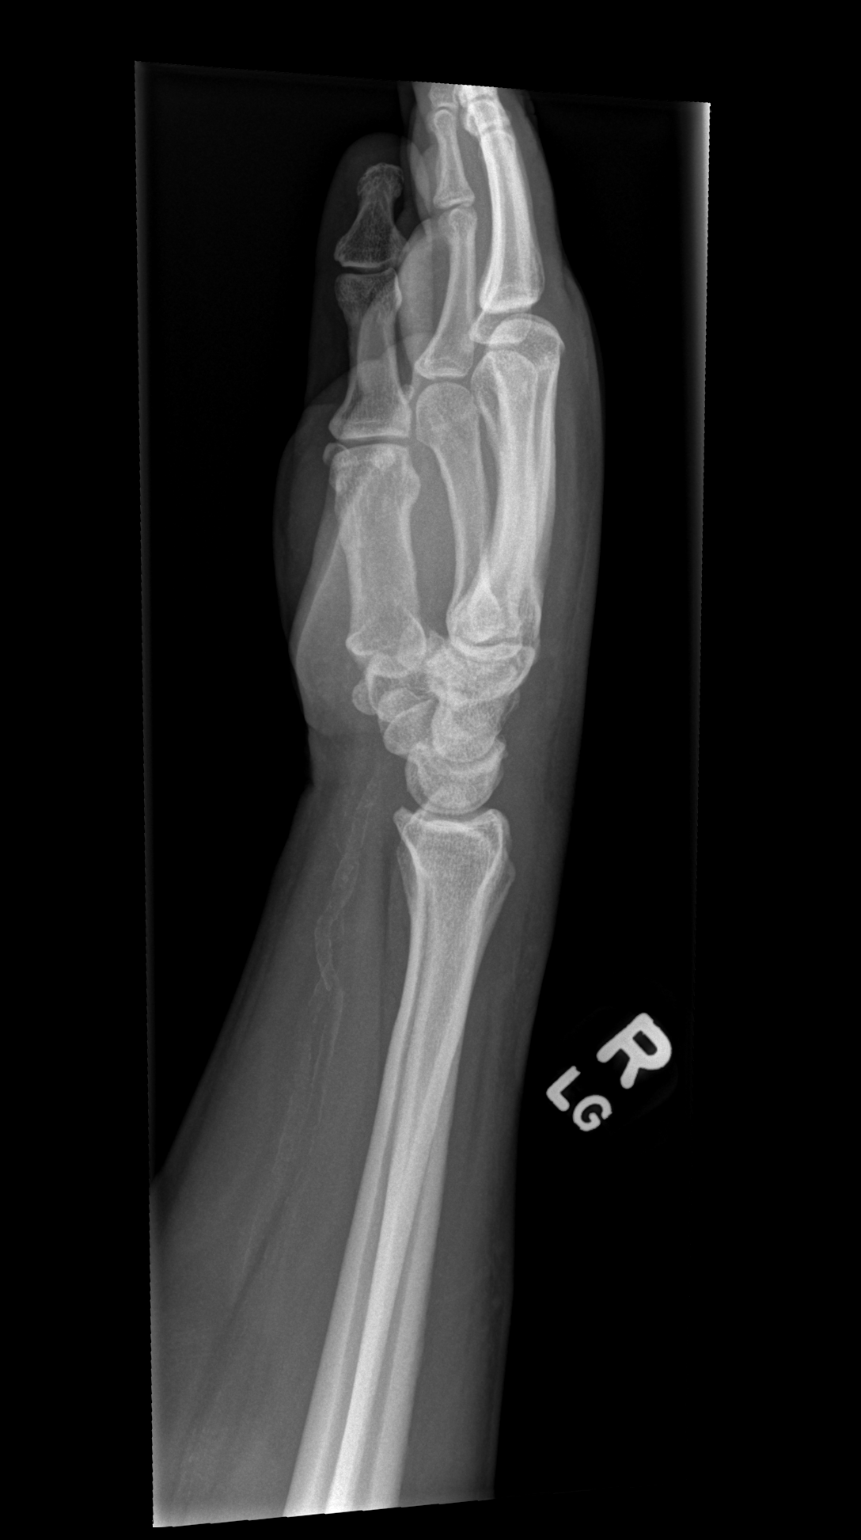

[x wrist navicular view right]
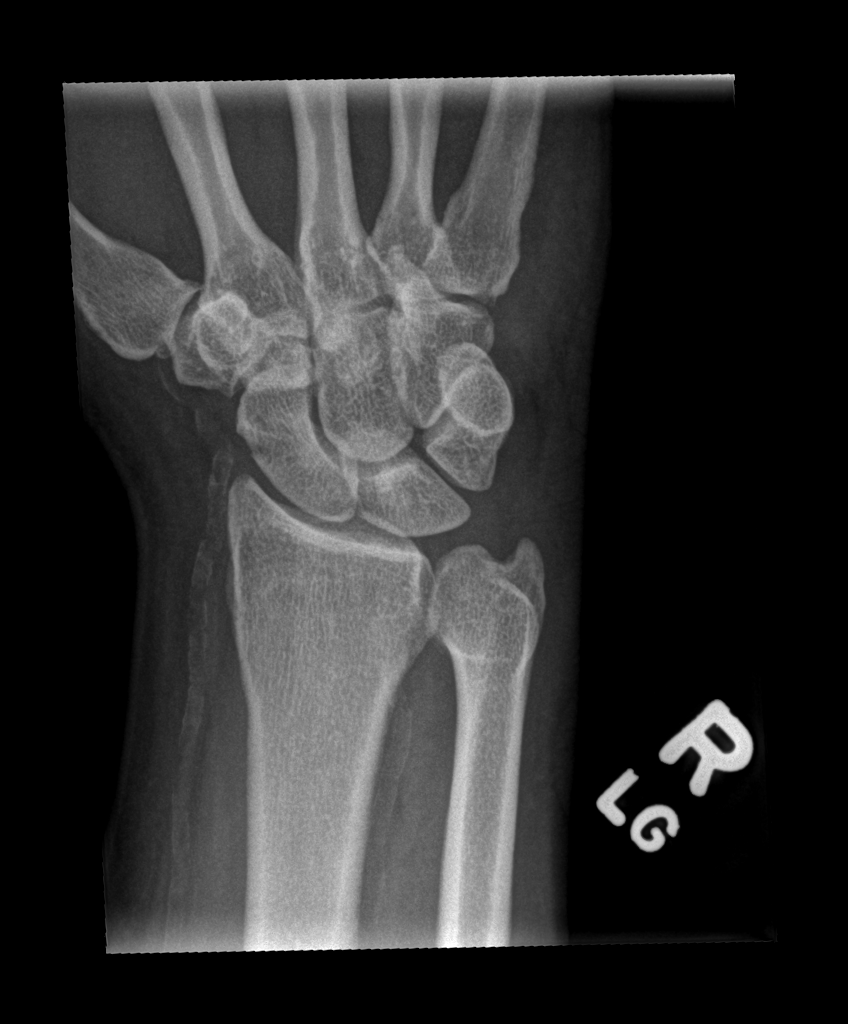

[4 of 4 positions shown; findings below may reference images not displayed]

FINDINGS: There is no evidence of fracture or dislocation. There is no
evidence of arthropathy or other focal bone abnormality. Soft
tissues are unremarkable. Premature arterial calcification
IMPRESSION: 1. No acute or erosive finding.
2. Premature arterial calcification.

## 2020-08-01 DIAGNOSIS — E1140 Type 2 diabetes mellitus with diabetic neuropathy, unspecified: Secondary | ICD-10-CM | POA: Diagnosis not present

## 2020-08-01 NOTE — Chronic Care Management (AMB) (Signed)
  Care Management   Follow Up Note   08/01/2020 Name: [name] MRN: [id number] DOB: 02-01-1976  Referred by: [name], MD Reason for referral : Chronic Care Management (ED f/u)   [name] is a 44 y.o. year old female who is a primary care patient of [name], MD. The care management team was consulted for assistance with care management and care coordination needs.    Review of patient status, including review of consultants reports, relevant laboratory and other test results, and collaboration with appropriate care team members and the patient's provider was performed as part of comprehensive patient evaluation and provision of chronic care management services.    SDOH (Social Determinants of Health) assessments performed: No See Care Plan activities for detailed interventions related to [facility])     Advanced Directives: See Care Plan and Vynca application for related entries.   Goals Addressed              This Visit's Progress   .  My blood sugars get high my Heart rate gets elevated (pt-stated)        CARE PLAN ENTRY (see longtitudinal plan of care for additional care plan information)  Objective:  Lab Results  Component Value Date   HGBA1C 12.7 (H) 06/14/2020 .   Lab Results  Component Value Date   CREATININE 1.19 (H) 07/18/2020   CREATININE 0.88 07/10/2020   CREATININE 0.85 06/23/2020 .    Current Barriers:  Marland Kitchen Knowledge Deficits related to basic Diabetes pathophysiology and self care/management- discussed with the patient regarding her elevated HR on 07/18/20.  The patient stated that she had eaten and went to sleep and forgot to take her insulin.  When she woke up her HR was elevated blood sugar elevated.  Case Manager Clinical Goal(s):  Over the next 14 days, patient will demonstrate improved adherence to prescribed treatment plan for diabetes self care/management as evidenced by: patient taking her insulin as scheduled  Interventions:    . Provided education to patient about basic DM disease process . Discussed plans with patient for ongoing care management follow up and provided patient with direct contact information for care management team . Patient is getting her Dexcom back to keep check of her blood sugar . Advised the patient to take her medications on time . Discussed diet and fluids that she drinks.  She states that she is trying.  She has education material and states that she has read some of it. . She states that at her next visit that she wants to talk about having a fast acting insulin to have incase her blood sugars get high. I explain that it can be dangerous because you can bottom out with fast acting insulin if not careful.  Patient Self Care Activities:  . Attends all scheduled provider appointments  Initial goal documentation         The care management team will reach out to the patient again over the next 14 days.   Lazaro Arms RN, BSN, [facility] Care Management Coordinator [place] Phone: [phone] Fax: [phone]

## 2020-08-01 NOTE — Patient Instructions (Signed)
Visit Information  Goals Addressed              This Visit's Progress   .  My blood sugars get high my Heart rate gets elevated (pt-stated)        CARE PLAN ENTRY (see longtitudinal plan of care for additional care plan information)  Objective:  Lab Results  Component Value Date   HGBA1C 12.7 (H) 06/14/2020 .   Lab Results  Component Value Date   CREATININE 1.19 (H) 07/18/2020   CREATININE 0.88 07/10/2020   CREATININE 0.85 06/23/2020 .    Current Barriers:  Marland Kitchen Knowledge Deficits related to basic Diabetes pathophysiology and self care/management- discussed with the patient regarding her elevated HR on 07/18/20.  The patient stated that she had eaten and went to sleep and forgot to take her insulin.  When she woke up her HR was elevated blood sugar elevated.  Case Manager Clinical Goal(s):  Over the next 14 days, patient will demonstrate improved adherence to prescribed treatment plan for diabetes self care/management as evidenced by: patient taking her insulin as scheduled  Interventions:  . Provided education to patient about basic DM disease process . Discussed plans with patient for ongoing care management follow up and provided patient with direct contact information for care management team . Patient is getting her Dexcom back to keep check of her blood sugar . Advised the patient to take her medications on time . Discussed diet and fluids that she drinks.  She states that she is trying.  She has education material and states that she has read some of it. . She states that at her next visit that she wants to talk about having a fast acting insulin to have incase her blood sugars get high. I explain that it can be dangerous because you can bottom out with fast acting insulin if not careful.  Patient Self Care Activities:  . Attends all scheduled provider appointments  Initial goal documentation        [name] was given information about Care Management services  today including:  1. Care Management services include personalized support from designated clinical staff supervised by her physician, including individualized plan of care and coordination with other care providers 2. 24/7 contact phone numbers for assistance for urgent and routine care needs. 3. The patient may stop CCM services at any time (effective at the end of the month) by phone call to the office staff.  Patient agreed to services and verbal consent obtained.   The patient verbalized understanding of instructions provided today and declined a print copy of patient instruction materials.   The care management team will reach out to the patient again over the next 14 days.   Lazaro Arms RN, BSN, [facility] Care Management Coordinator [place] Phone: [phone] Fax: [phone]

## 2020-08-02 DIAGNOSIS — E1140 Type 2 diabetes mellitus with diabetic neuropathy, unspecified: Secondary | ICD-10-CM | POA: Diagnosis not present

## 2020-08-03 DIAGNOSIS — E1140 Type 2 diabetes mellitus with diabetic neuropathy, unspecified: Secondary | ICD-10-CM | POA: Diagnosis not present

## 2020-08-04 ENCOUNTER — Telehealth: Payer: Self-pay | Admitting: *Deleted

## 2020-08-04 ENCOUNTER — Other Ambulatory Visit: Payer: Self-pay

## 2020-08-04 ENCOUNTER — Telehealth: Payer: Medicaid Other | Admitting: Cardiology

## 2020-08-04 DIAGNOSIS — E1140 Type 2 diabetes mellitus with diabetic neuropathy, unspecified: Secondary | ICD-10-CM | POA: Diagnosis not present

## 2020-08-04 NOTE — Telephone Encounter (Signed)
Rescheduled patient for 08/20/20 at 11:00 am. Patient is agreeable to appointment.

## 2020-08-04 NOTE — Telephone Encounter (Signed)
I tried three times today to reach patient prior to visit with Dr Radford Pax.  Last attempt was at 10:40 today.  Left message to call office.  Appointment will need to be rescheduled.

## 2020-08-06 DIAGNOSIS — J45909 Unspecified asthma, uncomplicated: Secondary | ICD-10-CM | POA: Diagnosis not present

## 2020-08-06 DIAGNOSIS — E669 Obesity, unspecified: Secondary | ICD-10-CM | POA: Diagnosis not present

## 2020-08-06 DIAGNOSIS — E1140 Type 2 diabetes mellitus with diabetic neuropathy, unspecified: Secondary | ICD-10-CM | POA: Diagnosis not present

## 2020-08-06 DIAGNOSIS — G4733 Obstructive sleep apnea (adult) (pediatric): Secondary | ICD-10-CM | POA: Diagnosis not present

## 2020-08-06 DIAGNOSIS — M86172 Other acute osteomyelitis, left ankle and foot: Secondary | ICD-10-CM | POA: Diagnosis not present

## 2020-08-07 DIAGNOSIS — E1140 Type 2 diabetes mellitus with diabetic neuropathy, unspecified: Secondary | ICD-10-CM | POA: Diagnosis not present

## 2020-08-08 DIAGNOSIS — E1140 Type 2 diabetes mellitus with diabetic neuropathy, unspecified: Secondary | ICD-10-CM | POA: Diagnosis not present

## 2020-08-11 DIAGNOSIS — E1140 Type 2 diabetes mellitus with diabetic neuropathy, unspecified: Secondary | ICD-10-CM | POA: Diagnosis not present

## 2020-08-12 ENCOUNTER — Other Ambulatory Visit: Payer: Self-pay

## 2020-08-12 ENCOUNTER — Ambulatory Visit (INDEPENDENT_AMBULATORY_CARE_PROVIDER_SITE_OTHER): Payer: Medicaid Other | Admitting: Family Medicine

## 2020-08-12 DIAGNOSIS — E1151 Type 2 diabetes mellitus with diabetic peripheral angiopathy without gangrene: Secondary | ICD-10-CM

## 2020-08-12 DIAGNOSIS — E1165 Type 2 diabetes mellitus with hyperglycemia: Secondary | ICD-10-CM | POA: Diagnosis not present

## 2020-08-12 DIAGNOSIS — E662 Morbid (severe) obesity with alveolar hypoventilation: Secondary | ICD-10-CM

## 2020-08-12 DIAGNOSIS — G2581 Restless legs syndrome: Secondary | ICD-10-CM | POA: Diagnosis not present

## 2020-08-12 DIAGNOSIS — G8929 Other chronic pain: Secondary | ICD-10-CM

## 2020-08-12 DIAGNOSIS — IMO0002 Reserved for concepts with insufficient information to code with codable children: Secondary | ICD-10-CM

## 2020-08-12 MED ORDER — HYDROCODONE-ACETAMINOPHEN 10-325 MG PO TABS: ORAL_TABLET | ORAL | 0 refills | Status: DC

## 2020-08-12 MED ORDER — ROPINIROLE HCL 1 MG PO TABS: 1.0000 mg | ORAL_TABLET | Freq: Every day | ORAL | 0 refills | Status: DC

## 2020-08-12 NOTE — Patient Instructions (Signed)
It was great to see you again today!  Refilled pain medication  We will call [facility] about bipap issue  Refilled ropinirole  Referring to endocrinologist  Send me or Dr. Helane Gunther messages with your sugars  Follow up in 2 month   Be well, Dr. [name]

## 2020-08-13 DIAGNOSIS — E1140 Type 2 diabetes mellitus with diabetic neuropathy, unspecified: Secondary | ICD-10-CM | POA: Diagnosis not present

## 2020-08-14 DIAGNOSIS — E1140 Type 2 diabetes mellitus with diabetic neuropathy, unspecified: Secondary | ICD-10-CM | POA: Diagnosis not present

## 2020-08-14 DIAGNOSIS — Z89512 Acquired absence of left leg below knee: Secondary | ICD-10-CM | POA: Diagnosis not present

## 2020-08-15 ENCOUNTER — Telehealth: Payer: Self-pay

## 2020-08-15 ENCOUNTER — Telehealth: Payer: Medicaid Other

## 2020-08-15 DIAGNOSIS — E1140 Type 2 diabetes mellitus with diabetic neuropathy, unspecified: Secondary | ICD-10-CM | POA: Diagnosis not present

## 2020-08-15 NOTE — Telephone Encounter (Signed)
°  Care Management   Outreach Note  08/15/2020 Name: [name] MRN: [id number] DOB: 26-Aug-1976  Referred by: [name], MD Reason for referral : Appointment (DM II)   An unsuccessful telephone outreach was attempted today. The patient was referred to the case management team for assistance with care management and care coordination.   Follow Up Plan: The care management team will reach out to the patient again over the next 7-14 days.   Lazaro Arms RN, BSN, [facility] Care Management Coordinator [place] Phone: [phone] Fax: [phone]

## 2020-08-16 DIAGNOSIS — E1140 Type 2 diabetes mellitus with diabetic neuropathy, unspecified: Secondary | ICD-10-CM | POA: Diagnosis not present

## 2020-08-17 DIAGNOSIS — E1140 Type 2 diabetes mellitus with diabetic neuropathy, unspecified: Secondary | ICD-10-CM | POA: Diagnosis not present

## 2020-08-17 NOTE — Progress Notes (Signed)
  Date of Visit: 08/12/2020   SUBJECTIVE:   HPI:  [name] presents today for routine follow up.   Diabetes - taking insulin 70/30 100u in AM and 90u in PM. Has had trouble checking sugars lately because dexcom sensor fell off. Is working on getting another one. Requests referral to endocrinologist. Patient thinks she needs mealtime coverage added.  RLS - needs refill of ropinirole. For some reason pharmacy gave her a lower dose than I prescribed last time.  Chronic pain - due for refills today. Taking medications as prescribed. No side effects. Storing medications safely. Helps her be functional.  OSA/OHS - having trouble getting bipap supplies through [facility]. Is unable to have oxygen bleed in to her bipap at night. Has not required any daytime O2 lately.  Has pulse ox at home.  OBJECTIVE:   BP 112/80   Pulse 93   SpO2 96%  Gen: no acute distress, pleasant, cooperative HEENT: normocephalic, atraumatic  Lungs: normal work of breathing   ASSESSMENT/PLAN:   Health maintenance:  -declines flu shot today  Encounter for chronic pain management Benefits of continued opioid therapy outweigh risks. Patient is ensuring she stores medication safely. PDMP reviewed with appropriate findings. Refill medications x2 months. Follow up in 2 months .  DM (diabetes mellitus) type II uncontrolled, periph vascular disorder [facility]) Patient will reinstate dexcom use at home Referral entered to endocrinology (any office but [place] by patient request) Until can get in with endo, she will send [place] reports of her sugars (to either me or Dr. Valentina Lucks) so we can make adjustments.  Restless leg syndrome Refill ropinirole  Obesity hypoventilation syndrome ([place]) Will ask RN team to check in with [facility] about the issue getting her bipap supplies.  FOLLOW UP: Follow up in 2 mos for above issues Refer to endocrinology  [name], [place]

## 2020-08-17 NOTE — Assessment & Plan Note (Signed)
Patient will reinstate dexcom use at home Referral entered to endocrinology (any office but [place] by patient request) Until can get in with endo, she will send [place] reports of her sugars (to either me or Dr. Valentina Lucks) so we can make adjustments.

## 2020-08-17 NOTE — Assessment & Plan Note (Signed)
Benefits of continued opioid therapy outweigh risks. Patient is ensuring she stores medication safely. PDMP reviewed with appropriate findings. Refill medications x2 months. Follow up in 2 months .

## 2020-08-17 NOTE — Assessment & Plan Note (Signed)
Will ask RN team to check in with [facility] about the issue getting her bipap supplies.

## 2020-08-17 NOTE — Assessment & Plan Note (Signed)
Refill ropinirole

## 2020-08-17 NOTE — Assessment & Plan Note (Signed)
>>  ASSESSMENT AND PLAN FOR INSULIN DEPENDENT TYPE 2 DIABETES MELLITUS ([place]) WRITTEN ON 08/17/2020 10:43 PM BY Trey Gulbranson, [name], MD  Patient will reinstate dexcom use at home Referral entered to endocrinology (any office but [place] by patient request) Until can get in with endo, she will send [place] reports of her sugars (to either me or Dr. Valentina Lucks) so we can make adjustments.

## 2020-08-18 DIAGNOSIS — E1140 Type 2 diabetes mellitus with diabetic neuropathy, unspecified: Secondary | ICD-10-CM | POA: Diagnosis not present

## 2020-08-20 ENCOUNTER — Telehealth: Payer: Medicaid Other | Admitting: Cardiology

## 2020-08-20 DIAGNOSIS — Z89512 Acquired absence of left leg below knee: Secondary | ICD-10-CM | POA: Diagnosis not present

## 2020-08-20 DIAGNOSIS — E1140 Type 2 diabetes mellitus with diabetic neuropathy, unspecified: Secondary | ICD-10-CM | POA: Diagnosis not present

## 2020-08-20 DIAGNOSIS — E13621 Other specified diabetes mellitus with foot ulcer: Secondary | ICD-10-CM | POA: Diagnosis not present

## 2020-08-21 ENCOUNTER — Ambulatory Visit: Payer: Medicaid Other | Admitting: Dietician

## 2020-08-21 DIAGNOSIS — E1140 Type 2 diabetes mellitus with diabetic neuropathy, unspecified: Secondary | ICD-10-CM | POA: Diagnosis not present

## 2020-08-22 DIAGNOSIS — E1140 Type 2 diabetes mellitus with diabetic neuropathy, unspecified: Secondary | ICD-10-CM | POA: Diagnosis not present

## 2020-08-23 DIAGNOSIS — E1140 Type 2 diabetes mellitus with diabetic neuropathy, unspecified: Secondary | ICD-10-CM | POA: Diagnosis not present

## 2020-08-25 ENCOUNTER — Telehealth: Payer: Self-pay | Admitting: Family Medicine

## 2020-08-25 NOTE — Telephone Encounter (Signed)
Patient called the after-hours line because of increased shortness of breath over the last 2 days. She reports shortness of breath with activity but reports her normal work of breathing at rest. Patient reports that she is on home O2 and she monitors her O2 saturation and that has remained above 90% except when she lays completely flat which she reports is normal. Patient reports she has asthma and has breathing treatments but has not used them. Patient denies any chest pain, nausea, vomiting, congestion, runny nose, headaches, increased weakness. She reports she she does not feel like she is getting exhausted from the breathing difficulty but was just concerned. After further discussion the patient reports she wants to try breathing treatment and see if she notices improvement. If she does not notice improvement she will call back and possibly schedule appointment in our clinic. I gave strict ED precautions including but not limited to worsening shortness of breath, chest pain, weakness, difficulty mentating. Voiced understanding.

## 2020-08-27 ENCOUNTER — Ambulatory Visit: Payer: Medicaid Other | Admitting: Rehabilitation

## 2020-08-27 ENCOUNTER — Telehealth: Payer: Self-pay

## 2020-08-27 NOTE — Telephone Encounter (Signed)
Received the following staff message from Dr. [name] regarding patient having issues with receiving Bipap supplies.   This patient is having some issues with her bipap supplies through [facility]. I'm not fully clear on the problem - it's something about not having the right equipment to bleed oxygen in through her bipap machine at night. Can you check with [facility] on whether they need a new order from me?   Messaged Leory Plowman New at [place]. Please see below message  Hello,  Looks like they tried to ship her the enrichment port on 07/17/20. May be a stock issue. I have messaged the local RT team to contact her to see what is missing and to make sure we are sending the correct items.   Thanks!   Will await further response on matter.   Talbot Grumbling, RN

## 2020-08-28 ENCOUNTER — Ambulatory Visit (INDEPENDENT_AMBULATORY_CARE_PROVIDER_SITE_OTHER): Payer: Medicaid Other | Admitting: Internal Medicine

## 2020-08-28 ENCOUNTER — Telehealth: Payer: Self-pay | Admitting: Internal Medicine

## 2020-08-28 ENCOUNTER — Other Ambulatory Visit: Payer: Self-pay

## 2020-08-28 ENCOUNTER — Encounter: Payer: Self-pay | Admitting: Internal Medicine

## 2020-08-28 VITALS — BP 110/70 | HR 102 | Temp 97.2°F | Ht 62.0 in | Wt 362.0 lb

## 2020-08-28 DIAGNOSIS — G4733 Obstructive sleep apnea (adult) (pediatric): Secondary | ICD-10-CM

## 2020-08-28 DIAGNOSIS — E662 Morbid (severe) obesity with alveolar hypoventilation: Secondary | ICD-10-CM | POA: Diagnosis not present

## 2020-08-28 DIAGNOSIS — I5033 Acute on chronic diastolic (congestive) heart failure: Secondary | ICD-10-CM

## 2020-08-28 DIAGNOSIS — J9611 Chronic respiratory failure with hypoxia: Secondary | ICD-10-CM | POA: Diagnosis not present

## 2020-08-28 MED ORDER — TORSEMIDE 20 MG PO TABS: 20.0000 mg | ORAL_TABLET | Freq: Every day | ORAL | 0 refills | Status: DC

## 2020-08-28 NOTE — Telephone Encounter (Signed)
Called [facility] and spoke with Anderson Malta with the resupply team regarding the need for the oxygen port to bleed into her bipap.  Advised to have the patient call [phone] to get supplies ordered.  I was told she was eligible for filters, headgear and cushions as well as the bleed in adapter. Called the patient and advised to call [phone] to get supplies ordered including the bleed in adaptor for her bipap.  She states she placed an order in August and she has still not received it.  Advised that they would not allow me to place an order for her that she would have to call to order.  She verbalized understanding.  Nothing further needed.

## 2020-08-28 NOTE — Progress Notes (Signed)
[name]    [id number]    1976-04-21  Primary Care Physician:McIntyre, [name], MD  Referring Physician: Noemi Chapel, MD [place],  [place] [id number] Reason for Consultation: "shortness of breath, question of COPD" Date of Consultation: 08/28/2020  Chief complaint:   Chief Complaint  Patient presents with  . Consult    sob at rest and exertion and laying flat on back.  denies cough.  in hospital in June, got better, last 4-5 days sob is worse.     HPI: Shortness of breath which is worse when she is up walking around, and also when laying flat. She sleeps with one pillow.  Dyspnea worsened in February/March of 2021 and has been progressive.  Has had 3-4 ED visits for shortness of breath in the last year.   She has been on oxygen nocturnally in the past. She has a BIPAP machine now but hasn't been able to wear her nocturnal oxygen. Previously on CPAP. DME is [facility]. Hasn't been able to bleed in her oxygen into her BIPAP machine   Stopped taking lasix 1-2 weeks ago because she didn't want to be a burden on her daughter to ask for help with urinating.   Taking inhalers albuterol nebulizer and symbicort. These do seem to help somewhat.   No childhood respiratory disease.    Social History   Occupational History  . Not on file  Tobacco Use  . Smoking status: Former Smoker    Packs/day: 0.25    Years: 15.00    Pack years: 3.75    Types: Cigarettes    Quit date: 12/06/2005    Years since quitting: 14.7  . Smokeless tobacco: Never Used  . Tobacco comment: smokes for a couple of months  Vaping Use  . Vaping Use: Never used  Substance and Sexual Activity  . Alcohol use: No    Alcohol/week: 0.0 standard drinks  . Drug use: Yes    Comment: OD attempts on home meds    . Sexual activity: Yes    Partners: Male    Relevant family history: Family History  Problem Relation Age of Onset  . Diabetes Mother   . Hyperlipidemia Mother   . Depression  Mother   . GER disease Mother   . Allergic rhinitis Mother   . Restless legs syndrome Mother   . Heart attack Paternal Uncle   . Heart disease Paternal Grandmother   . Heart attack Paternal Grandmother   . Heart attack Paternal Grandfather   . Heart disease Paternal Grandfather   . Heart attack Father   . Migraines Sister   . Cancer Maternal Grandmother        COLON  . Hypertension Maternal Grandmother   . Hyperlipidemia Maternal Grandmother   . Diabetes Maternal Grandmother   . Other Maternal Grandfather        GUN SHOT  . Anxiety disorder Sister   . Asthma Child     Past Medical History:  Diagnosis Date  . Acute osteomyelitis of ankle or foot, left ([place]) 01/31/2018  . Alveolar hypoventilation   . Anemia    not on iron pill  . Asthma   . Bipolar 2 disorder ([place])   . Carpal tunnel syndrome on right    recurrent  . Cellulitis 08/2010-08/2011  . Chronic pain   . COPD (chronic obstructive pulmonary disease) ([name])    Symbicort daily and Proventil as needed  . Costochondritis   .  Diabetes mellitus type II, uncontrolled ([address]) 2000   Type 2, Uncontrolled.Takes Lantus daily.Fasting blood sugar runs 150  . Drug-seeking behavior   . GERD (gastroesophageal reflux disease)    takes Pantoprazole and Zantac daily  . HLD (hyperlipidemia)    takes Atorvastatin daily  . Hypertension    takes Lisinopril and Coreg daily  . Morbid obesity ([place])   . Nocturia   . OSA on CPAP   . Peripheral neuropathy    takes Gabapentin daily  . Pneumonia    "walking" several yrs ago and as a baby (12/05/2018)  . Rectal fissure   . Restless leg   . SVT (supraventricular tachycardia) ([place])   . Syncope 02/25/2016  . Urinary frequency   . Varicose veins    Right medial thigh and Left leg     Past Surgical History:  Procedure Laterality Date  . AMPUTATION Left 02/01/2018   Procedure: LEFT FOURTH AND 5TH TOE RAY AMPUTATION;  Surgeon: [name], MD;  Location: [place];  Service: Orthopedics;   Laterality: Left;  . AMPUTATION Left 03/03/2018   Procedure: LEFT BELOW KNEE AMPUTATION;  Surgeon: [name], MD;  Location: [place];  Service: Orthopedics;  Laterality: Left;  . CARPAL TUNNEL RELEASE Bilateral   . CESAREAN SECTION  2007  . INCISION AND DRAINAGE PERIRECTAL ABSCESS Left 05/18/2019   Procedure: IRRIGATION AND DEBRIDEMENT OF PANNIS ABSCESS, POSSIBLE DEBRIDEMENT OF BUTTOCK WOUND;  Surgeon: Donnie Mesa, MD;  Location: [place] [facility];  Service: General;  Laterality: Left;  . IRRIGATION AND DEBRIDEMENT BUTTOCKS Left 05/17/2019   Procedure: IRRIGATION AND DEBRIDEMENT BUTTOCKS;  Surgeon: Donnie Mesa, MD;  Location: [place];  Service: General;  Laterality: Left;  . KNEE ARTHROSCOPY Right 07/17/2010  . LEFT HEART CATHETERIZATION WITH CORONARY ANGIOGRAM N/A 07/27/2012   Procedure: LEFT HEART CATHETERIZATION WITH CORONARY ANGIOGRAM;  Surgeon: Sherren Mocha, MD;  [facility];  Service: Cardiovascular;  Laterality: N/A;  . MASS EXCISION N/A 06/29/2013   Procedure:  WIDE LOCAL EXCISION OF POSTERIOR NECK ABSCESS;  Surgeon: Ralene Ok, MD;  Location: [place];  Service: General;  Laterality: N/A;  . REPAIR KNEE LIGAMENT Left    "fixed ligaments and chipped patella"  . right transmetatarsal amputation        Physical Exam: Blood pressure 110/70, pulse (!) 102, temperature (!) 97.2 F (36.2 C), temperature source Temporal, height 5\' 2"  (1.575 m), weight (!) 362 lb (164.2 kg), SpO2 93 %. Gen:      No acute distress ENT:  no nasal polyps, mucus membranes moist Lungs:    Diminished, No increased respiratory effort, symmetric chest wall excursion, clear to auscultation bilaterally, no wheezes or crackles CV:         Regular rate and rhythm; no murmurs, rubs, or gallops.  Bilateral pitting edema Abd:     Obese, + bowel sounds; soft, non-tender MSK: left foot s/p amputation with prosthesis, right toes s/p amputation Skin:      Warm and dry; no rashes Neuro: normal speech, no focal facial  asymmetry Psych: alert and oriented x3, normal mood and affect   Data Reviewed/Medical Decision Making:  Independent interpretation of tests: Imaging: . Review of patient's CT Angio in July 2021 images revealed no acute cardiopulmonary process. No PE. No effusion or pneumonia or ILD. The patient's images have been independently reviewed by me.    PFTs: None on file  Echocardiogram July 2021  1. Limited study quality but there is normal left ventricular systolic  and diastolic  function, right ventricle is poorly visualized.  2. Left ventricular ejection fraction, by estimation, is 55 to 60%. The  left ventricle has normal function. The left ventricle has no regional  wall motion abnormalities. Left ventricular diastolic parameters were  normal.  3. Right ventricular systolic function was not well visualized. The right  ventricular size is normal.  4. The mitral valve is normal in structure. No evidence of mitral valve  regurgitation. No evidence of mitral stenosis.  5. The aortic valve is normal in structure. Aortic valve regurgitation is  not visualized. No aortic stenosis is present.  6. The inferior vena cava is normal in size with greater than 50%  respiratory variability, suggesting right atrial pressure of 3 mmHg.   Labs: Lab Results  Component Value Date   WBC 9.8 07/18/2020   HGB 12.0 07/18/2020   HCT 37.4 07/18/2020   MCV 90.8 07/18/2020   PLT 330 07/18/2020   Lab Results  Component Value Date   NA 129 (L) 07/18/2020   K 4.7 07/18/2020   CL 94 (L) 07/18/2020   CO2 26 07/18/2020     Immunization status:  Immunization History  Administered Date(s) Administered  . Influenza-Unspecified 07/22/2017  . PFIZER SARS-COV-2 Vaccination 06/26/2020, 07/17/2020  . Pneumococcal Polysaccharide-23 09/21/2016  . Tdap 05/04/2014, 06/29/2015    . I reviewed prior external note(s) from ED . I reviewed the result(s) of the labs and imaging as noted above.  . I have  ordered PFTs  Assessment:  Shortness of breath History of tobacco use OHS/OSA  Decompensated HFpEF  Plan/Recommendations: [name] has progressive dyspnea and orthopnea in the setting of discontinuation of diuretics. I've asked her to resume them today and have given her a 30 day supply until she can be re-established with cardiology. We discussed the importance of diuretics in her symptom management. She has her daughter at home and sometimes a friend who stays with her. Already has a bedside commode and bedpan she uses. I wonder if there is any type of in home care or assistance available to her to facilitate this. Continue Bipap for now. Continue inhalers. IF she is having persistent symptoms after resuming diuretics we can consider PFTs at the future appt. Given her limited smoking history, I think COPD is much lower on the differential. I additionally recommend she sleeps on an incline or with a wedge pillow to help orthopnea.  We will also call [facility] and help her get the port she needs to start bleeding in oxygen.  We discussed disease management and progression at length today.    Return to Care: Return in about 4 months (around 12/28/2020).  Lenice Llamas, MD Pulmonary and Critical Care Medicine [place]  CC: Noemi Chapel, MD

## 2020-08-28 NOTE — Patient Instructions (Addendum)
The patient should have follow up scheduled with myself in 4 months.   Please start taking your Torsemide again. I am giving you a one month supply and please follow up with your PCP and Cardiology.  Cardiology can continue refilling your Torsemide and increase it if necessary.  We will inquire about your Bipap to [employer].

## 2020-08-28 NOTE — Progress Notes (Deleted)
Shortness of breath which is worse when she is up walking around, and also when laying flat. She sleeps with one pillow.  Dyspnea worsened in February/March of 2021 and has been progressive.  Has had 3-4 ED visits for shortness of breath in the last year.   She has been on oxygen nocturnally in the past. She has a BIPAP machine now but hasn't been able to wear her nocturnal oxygen. Previously on CPAP.   DME is [facility].   Stopped taking lasix 1-2 weeks ago.

## 2020-08-30 ENCOUNTER — Telehealth: Payer: Self-pay | Admitting: Family Medicine

## 2020-08-30 NOTE — Telephone Encounter (Signed)
**  After Hours/ Emergency Line Call**  Received a call to report that [name] is having shortness of breath and chest pain. Recommended that go to ED with these symptoms as they are concerning. She said thank you and hung up the phone. Will forward to PCP.  Gerlene Fee, DO PGY-2, [facility] 08/30/2020 9:03 PM

## 2020-09-02 ENCOUNTER — Telehealth: Payer: Self-pay | Admitting: Cardiovascular Disease

## 2020-09-02 NOTE — Telephone Encounter (Signed)
New message:     Patient calling to see if she can  See Dr, Burt Knack ASAP patient was asking for her nurse.

## 2020-09-02 NOTE — Telephone Encounter (Signed)
The patient reports she was seen by her pulmonary doctor and was told to call Cardiology for an ASAP appointment because she is having SOB and slight swelling and she needs to be on Lasix.  Offered the patient a visit with Nicki Reaper tomorrow but she declined. Scheduled the patient next Tuesday with [place].  She was grateful for call and agrees with treatment plan.

## 2020-09-03 ENCOUNTER — Other Ambulatory Visit: Payer: Self-pay | Admitting: Family Medicine

## 2020-09-05 DIAGNOSIS — G4733 Obstructive sleep apnea (adult) (pediatric): Secondary | ICD-10-CM | POA: Diagnosis not present

## 2020-09-05 DIAGNOSIS — E669 Obesity, unspecified: Secondary | ICD-10-CM | POA: Diagnosis not present

## 2020-09-05 DIAGNOSIS — J45909 Unspecified asthma, uncomplicated: Secondary | ICD-10-CM | POA: Diagnosis not present

## 2020-09-05 DIAGNOSIS — M86172 Other acute osteomyelitis, left ankle and foot: Secondary | ICD-10-CM | POA: Diagnosis not present

## 2020-09-08 NOTE — Progress Notes (Signed)
Cardiology Office Note:    Date:  09/09/2020   ID:  [name], DOB August 03, 1976, MRN [id number]  PCP:  Belinda Rio, MD  [facility] Cardiologist:  Belinda Mocha, MD   [facility] Electrophysiologist:  None   Referring MD: Belinda Rio, MD   Chief Complaint:  Congestive Heart Failure    Patient Profile:    [name] is a 44 y.o. female with:   Normal coronary arteries by cath in 2013  Morbid obesity   Diabetes mellitus, uncontrolled w/ neuropathy   Hx of diabetic foot ulcers  S/p L BKA  S/p R trans-met amputation  Asthma/COPD   Bipolar d/o  Hyperlipidemia   Hypertension   OSA on CPAP  GERD  Prior CV studies: Echocardiogram 06/15/20 EF 55-60, no RWMA, normal diastolic function  Myoview 05/13/20 EF 56, no infarct or ischemia, low risk  ABIs 09/01/17 R normal but may be inaccurate due to calcified vessels L non compressible due to calcified vessels  Holter 08/2017 NSR, avg HR 93, frequent PVCs (4%), occ vent couplets; no sustained arrhythmia   Cardiac catheterization 07/27/12 Normal coronary arteries   History of Present Illness:    [name] was last seen by Dr. Burt Hall in 6/21.  She was having symptoms of chest pain and a Myoview was obtained.  This was low risk and neg for ischemia.  Of note, she has been admitted several times in the last few months with hypoxic respiratory failure.  She was seen in the ED in August with shortness of breath and tachycardia.  Notes indicate she was in SVT when EMS arrived and required Adenosine for conversion to normal sinus rhythm.  I personally reviewed the ECG from EMS and the rhythm appears to be SVT with HR 153.  She was seen by Pulmonology recently and noted worsening shortness of breath.  She had stopped her diuretic a few weeks before this.  Dr. Shearon Hall placed her back on Furosemide and recommended follow up with Cardiology.  She returns for evaluation.    She is here today with her mother [name] who works in our office) and her husband.  She notes issues with orthopnea and increasing shortness of breath since she was diagnosed with heart failure in June.  She had been taking torsemide with improved symptoms.  Her symptoms did worsen when she stopped taking torsemide.  Since Dr. Shearon Hall placed her back on torsemide, she has noted improved symptoms.  She still has 1 pillow orthopnea and dyspnea with most activities.  She has not really had lower extremity swelling.  She continues to have chest discomfort.  She thinks that her chest discomfort may improve with improved volume status.     Past Medical History:  Diagnosis Date  . Acute osteomyelitis of ankle or foot, left ([place]) 01/31/2018  . Alveolar hypoventilation   . Anemia    not on iron pill  . Asthma   . Bipolar 2 disorder ([place])   . Carpal tunnel syndrome on right    recurrent  . Cellulitis 08/2010-08/2011  . Chronic pain   . COPD (chronic obstructive pulmonary disease) ([name])    Symbicort daily and Proventil as needed  . Costochondritis   . Diabetes mellitus type II, uncontrolled ([place]) 2000   Type 2, Uncontrolled.Takes Lantus daily.Fasting blood sugar runs 150  . Drug-seeking behavior   . GERD (gastroesophageal reflux disease)    takes Pantoprazole and Zantac daily  . HLD (hyperlipidemia)    takes Atorvastatin daily  .  Hypertension    takes Lisinopril and Coreg daily  . Morbid obesity ([employer])   . Nocturia   . OSA on CPAP   . Peripheral neuropathy    takes Gabapentin daily  . Pneumonia    "walking" several yrs ago and as a baby (12/05/2018)  . Rectal fissure   . Restless leg   . SVT (supraventricular tachycardia) ([place])   . Syncope 02/25/2016  . Urinary frequency   . Varicose veins    Right medial thigh and Left leg     Current Medications: Current Meds  Medication Sig  . Accu-Chek Softclix Lancets lancets Use to check blood glucose four times daily  . amLODipine (NORVASC) 10 MG tablet Take 1 tablet (10 mg total)  by mouth daily.  Marland Kitchen atorvastatin (LIPITOR) 40 MG tablet Take 1 tablet (40 mg total) by mouth daily.  . budesonide-formoterol (SYMBICORT) 160-4.5 MCG/ACT inhaler Inhale 2 puffs into the lungs 2 (two) times daily.  . Cholecalciferol 1000 units tablet Take 1 tablet (1,000 Units total) by mouth daily.  . collagenase (SANTYL) ointment Santyl 250 unit/gram topical ointment  APPLY TO CLEANSED AFFECTED AREA BY TOPICAL ROUTE ONCE DAILY  . Compression Bandages MISC Wear daily for swelling  . Continuous Blood Gluc Sensor (DEXCOM G6 SENSOR) MISC Inject 1 applicator into the skin as directed. Change sensor every 10 days.  . Continuous Blood Gluc Transmit (DEXCOM G6 TRANSMITTER) MISC Inject 1 Device into the skin as directed. Reuse 8 times with sensor changes.  . cyclobenzaprine (FLEXERIL) 5 MG tablet Take 5 mg by mouth 3 (three) times daily as needed.  . gabapentin (NEURONTIN) 600 MG tablet TAKE 2 TABLETS BY MOUTH 3 TIMES DAILY  . glucose blood (ACCU-CHEK GUIDE) test strip USE T0 CHECK BLOOD SUGAR 3 TIMES DAILY  . HYDROcodone-acetaminophen (NORCO) 10-325 MG tablet TAKE 1 TABLET BY MOUTH EVERY EIGHT HOURS AS NEEDED  . HYDROcodone-acetaminophen (NORCO) 10-325 MG tablet TAKE 1 TABLET BY MOUTH EVERY EIGHT HOURS AS NEEDED  . insulin isophane & regular human (HUMULIN 70/30 MIX) (70-30) 100 UNIT/ML KwikPen Inject 110 Units into the skin daily with breakfast AND 90 Units daily with supper.  . Insulin Pen Needle (SURE COMFORT PEN NEEDLES) 32G X 4 MM MISC Use to inject insulin as indicated  . ipratropium-albuterol (DUONEB) 0.5-2.5 (3) MG/3ML SOLN Take 3 mLs by nebulization every 4 (four) hours as needed.  . Lancets (ACCU-CHEK MULTICLIX) lancets 1 each by Other route as needed for other. Use as instructed  . metoprolol succinate (TOPROL XL) 25 MG 24 hr tablet Take 1 tablet (25 mg total) by mouth daily.  . Misc. Devices (PULSE OXIMETER FOR FINGER) MISC Use as needed to check pulse oxymetry  . Misc. Devices MISC Shrinkers  and liners for prosthesis  . pantoprazole (PROTONIX) 40 MG tablet Take 1 tablet (40 mg total) by mouth daily.  Marland Kitchen PROVENTIL HFA 108 (90 Base) MCG/ACT inhaler Inhale 1-2 puffs into the lungs every 6 (six) hours as needed for wheezing or shortness of breath.  Marland Kitchen rOPINIRole (REQUIP) 1 MG tablet Take 1 tablet (1 mg total) by mouth at bedtime.  . sodium chloride (OCEAN) 0.65 % SOLN nasal spray Place 1 spray into both nostrils as needed for congestion.  . torsemide (DEMADEX) 20 MG tablet Take 2 tablets (40 mg total) by mouth daily.  Marland Kitchen VICTOZA 18 MG/3ML SOPN inject 0.6 MG SUBCUTANEOUSLY ONCE A DAY FOR 1 WEEK, THEN INCREASE TO 1.2 MG ONCE daily max 1.8 MG  . [DISCONTINUED] torsemide (DEMADEX) 20  MG tablet Take 1 tablet (20 mg total) by mouth daily.     Allergies:   Cefepime, Kiwi extract, Morphine and related, Trental [pentoxifylline], and Nubain [nalbuphine hcl]   Social History   Tobacco Use  . Smoking status: Former Smoker    Packs/day: 0.25    Years: 15.00    Pack years: 3.75    Types: Cigarettes    Quit date: 12/06/2005    Years since quitting: 14.7  . Smokeless tobacco: Never Used  . Tobacco comment: smokes for a couple of months  Vaping Use  . Vaping Use: Never used  Substance Use Topics  . Alcohol use: No    Alcohol/week: 0.0 standard drinks  . Drug use: Yes    Comment: OD attempts on home meds       Family Hx: The patient's family history includes Allergic rhinitis in her mother; Anxiety disorder in her sister; Asthma in her child; Cancer in her maternal grandmother; Depression in her mother; Diabetes in her maternal grandmother and mother; GER disease in her mother; Heart attack in her father, paternal grandfather, paternal grandmother, and paternal uncle; Heart disease in her paternal grandfather and paternal grandmother; Hyperlipidemia in her maternal grandmother and mother; Hypertension in her maternal grandmother; Migraines in her sister; Other in her maternal grandfather;  Restless legs syndrome in her mother.  Review of Systems  Gastrointestinal: Negative for hematochezia and melena.  Genitourinary: Negative for hematuria.     EKGs/Labs/Other Test Reviewed:    EKG:  EKG is   ordered today.  The ekg ordered today demonstrates sinus tachycardia, HR 101, normal axis, low voltage, poor R wave progression, QTC 471, similar to prior tracings  Recent Labs: 06/17/2020: TSH 2.852 07/18/2020: ALT 14; B Natriuretic Peptide 44.1; BUN 32; Creatinine, Ser 1.19; Hemoglobin 12.0; Platelets 330; Potassium 4.7; Sodium 129   Recent Lipid Panel Lab Results  Component Value Date/Time   CHOL 274 (H) 04/10/2020 12:00 PM   TRIG 324 (H) 04/10/2020 12:00 PM   HDL 39 (L) 04/10/2020 12:00 PM   CHOLHDL 7.0 (H) 04/10/2020 12:00 PM   CHOLHDL 6.3 (H) 01/11/2017 09:09 AM   LDLCALC 173 (H) 04/10/2020 12:00 PM   LDLDIRECT 164.9 11/15/2014 11:48 AM    Physical Exam:    VS:  BP 128/78   Pulse (!) 101   Ht '5\' 2"'  (1.575 m)   Wt (!) 362 lb (164.2 kg) Comment: pt in wheelchair, pt given wt.  SpO2 97%   BMI 66.21 kg/m     Wt Readings from Last 3 Encounters:  09/09/20 (!) 362 lb (164.2 kg)  08/28/20 (!) 362 lb (164.2 kg)  07/18/20 (!) 364 lb (165.1 kg)     Constitutional:      Appearance: Healthy appearance. Not in distress.  Pulmonary:     Effort: Pulmonary effort is normal.     Breath sounds: No wheezing. No rales.  Cardiovascular:     Normal rate. Regular rhythm. Normal S1. Normal S2.     Murmurs: There is no murmur.  Edema:    Thigh: trace edema of the left thigh.    Pretibial: trace edema of the right pretibial area. Abdominal:     Palpations: Abdomen is soft.  Musculoskeletal:     Cervical back: Neck supple. Skin:    General: Skin is warm and dry.  Neurological:     Mental Status: Alert and oriented to person, place and time.     Cranial Nerves: Cranial nerves are intact.  ASSESSMENT & PLAN:    1. Chronic heart failure with preserved ejection fraction  ([name]) Echocardiogram in July demonstrated normal LV function.  Her body habitus makes it difficult to determine if she is volume overloaded.  Her symptoms suggest continued volume excess.  She has responded well to torsemide.  I have recommended increasing torsemide to 40 mg daily.  Start potassium 20 mg daily.  Obtain BMET today and repeat a week.  I will see her back in 2 weeks.  I will also discuss further with Dr. Burt Hall to determine if we should consider right and left heart catheterization.  2. Precordial pain She had a normal cardiac catheterization 2013 and a low risk Myoview in June 2021.  She continues to have chest discomfort.  As noted, I will discuss further with Dr. Burt Hall.  If her symptoms continue despite improved volume status, I question whether or not we should consider proceeding with right and left heart catheterization.  3. SVT (supraventricular tachycardia) ([place]) She is only had 2 episodes.  She is now on beta-blocker therapy.  Continue current dose of metoprolol succinate.  If she has recurrent episodes of SVT in the future, consider referral to EP.  4. Essential hypertension The patient's blood pressure is controlled on her current regimen.  Continue current therapy.     Dispo:  Return in about 2 weeks (around 09/23/2020) for Routine Follow Up, w/ Richardson Dopp, PA-C, in person.   Medication Adjustments/Labs and Tests Ordered: Current medicines are reviewed at length with the patient today.  Concerns regarding medicines are outlined above.  Tests Ordered: Orders Placed This Encounter  Procedures  . Basic metabolic panel  . Basic metabolic panel  . EKG 12-Lead   Medication Changes: Meds ordered this encounter  Medications  . torsemide (DEMADEX) 20 MG tablet    Sig: Take 2 tablets (40 mg total) by mouth daily.    Dispense:  60 tablet    Refill:  6  . potassium chloride SA (KLOR-CON M20) 20 MEQ tablet    Sig: Take 1 tablet (20 mEq total) by mouth daily.     Dispense:  30 tablet    Refill:  6    Signed, Richardson Dopp, PA-C  09/09/2020 10:28 AM    [address], [place], [place]  [address] Phone: [phone]; Fax: [phone]

## 2020-09-09 ENCOUNTER — Ambulatory Visit (INDEPENDENT_AMBULATORY_CARE_PROVIDER_SITE_OTHER): Payer: Medicaid Other | Admitting: Physician Assistant

## 2020-09-09 ENCOUNTER — Other Ambulatory Visit: Payer: Self-pay

## 2020-09-09 ENCOUNTER — Telehealth: Payer: Self-pay

## 2020-09-09 ENCOUNTER — Encounter: Payer: Self-pay | Admitting: Physician Assistant

## 2020-09-09 VITALS — BP 128/78 | HR 101 | Ht 62.0 in | Wt 362.0 lb

## 2020-09-09 DIAGNOSIS — I471 Supraventricular tachycardia, unspecified: Secondary | ICD-10-CM

## 2020-09-09 DIAGNOSIS — R072 Precordial pain: Secondary | ICD-10-CM

## 2020-09-09 DIAGNOSIS — I5033 Acute on chronic diastolic (congestive) heart failure: Secondary | ICD-10-CM | POA: Diagnosis not present

## 2020-09-09 DIAGNOSIS — I5032 Chronic diastolic (congestive) heart failure: Secondary | ICD-10-CM

## 2020-09-09 DIAGNOSIS — I10 Essential (primary) hypertension: Secondary | ICD-10-CM

## 2020-09-09 LAB — BASIC METABOLIC PANEL
BUN/Creatinine Ratio: 19 (ref 9–23)
BUN: 18 mg/dL (ref 6–24)
CO2: 23 mmol/L (ref 20–29)
Calcium: 9 mg/dL (ref 8.7–10.2)
Chloride: 96 mmol/L (ref 96–106)
Creatinine, Ser: 0.94 mg/dL (ref 0.57–1.00)
GFR calc Af Amer: 87 mL/min/{1.73_m2} (ref >59)
GFR calc non Af Amer: 75 mL/min/{1.73_m2} (ref >59)
Glucose: 507 mg/dL (ref 65–99)
Potassium: 4.6 mmol/L (ref 3.5–5.2)
Sodium: 133 mmol/L — ABNORMAL LOW (ref 134–144)

## 2020-09-09 MED ORDER — TORSEMIDE 20 MG PO TABS: 40.0000 mg | ORAL_TABLET | Freq: Every day | ORAL | 6 refills | Status: DC

## 2020-09-09 MED ORDER — POTASSIUM CHLORIDE CRYS ER 20 MEQ PO TBCR: 20.0000 meq | EXTENDED_RELEASE_TABLET | Freq: Every day | ORAL | 6 refills | Status: DC

## 2020-09-09 NOTE — Patient Instructions (Addendum)
Medication Instructions:  Your physician has recommended you make the following change in your medication:   1) Increase Torsemide to 40 mg, 2 tablets by mouth once a day 2) Start Potassium 20 mEq, 1 tablet by mouth once a day  *If you need a refill on your cardiac medications before your next appointment, please call your pharmacy*  Lab Work: You will have labs drawn today: BMET  Your physician recommends that you return for lab work in 1 week. **The lab is open from 7:30AM-4:30PM** you may come anytime between these hours.  If you have labs (blood work) drawn today and your tests are completely normal, you will receive your results only by: Marland Kitchen MyChart Message (if you have MyChart) OR . A paper copy in the mail If you have any lab test that is abnormal or we need to change your treatment, we will call you to review the results.   Testing/Procedures: None ordered today  Follow-Up: On 09/26/20 at 11:45AM with Richardson Dopp, PA-C

## 2020-09-09 NOTE — Telephone Encounter (Signed)
[name], patients representative for [employer], calls nurse line requesting PCP order for personal care services. Per [name], PCP needs to send order to [phone]. On the form please use code [phone] followed by "per treatment plan." Per [name], this should suffice.

## 2020-09-10 ENCOUNTER — Telehealth: Payer: Self-pay | Admitting: Physician Assistant

## 2020-09-10 NOTE — Telephone Encounter (Signed)
See lab result note in chart.  Critical lab addressed yesterday PM. Richardson Dopp, PA-C    09/10/2020 10:21 AM

## 2020-09-10 NOTE — Telephone Encounter (Signed)
Sarah from [employer] is calling with a critical lab result.

## 2020-09-10 NOTE — Telephone Encounter (Signed)
Stat lab received from [facility].  Glucose level 507 from BMP ordered yesterday 09/09/2020 at office visit.  Will forward to SW for review.

## 2020-09-12 DIAGNOSIS — E13621 Other specified diabetes mellitus with foot ulcer: Secondary | ICD-10-CM | POA: Diagnosis not present

## 2020-09-12 DIAGNOSIS — Z89512 Acquired absence of left leg below knee: Secondary | ICD-10-CM | POA: Diagnosis not present

## 2020-09-12 NOTE — Telephone Encounter (Signed)
PCS standard form completed, wrote message on form as requested Will return to [facility] RN team  [name], MD

## 2020-09-15 NOTE — Telephone Encounter (Signed)
PCS form faxed to appropriate fax number. Copy made for batch scanning.

## 2020-09-16 ENCOUNTER — Encounter: Payer: Self-pay | Admitting: Obstetrics and Gynecology

## 2020-09-16 ENCOUNTER — Other Ambulatory Visit: Payer: Medicaid Other

## 2020-09-16 NOTE — Patient Instructions (Signed)
° °  Ms.[name],  as a part of your Medicaid benefit, you are eligible for care management and care coordination services at no cost or copay. I was unable to reach you by phone today but would be happy to help you with your health related needs. Please feel free to call me at [phone].  A member of the Managed Medicaid care management team will reach out to you again over the next 7 days.   Aida Raider RN, BSN [place]   [facility] - Managed Medicaid High Risk [phone]

## 2020-09-16 NOTE — Patient Outreach (Signed)
Care Coordination  09/16/2020  [name] [birth year] [id number]  An unsuccessful telephone outreach was attempted today. The patient was referred to the case management team for assistance with care management and care coordination.   Follow Up Plan: The Managed Medicaid care management team will reach out to the patient again over the next 7 days.   Aida Raider RN, BSN [name]  [facility] - Managed Medicaid High Risk [phone]

## 2020-09-22 ENCOUNTER — Other Ambulatory Visit: Payer: Self-pay

## 2020-09-22 ENCOUNTER — Encounter (HOSPITAL_BASED_OUTPATIENT_CLINIC_OR_DEPARTMENT_OTHER): Payer: Medicaid Other | Attending: Internal Medicine | Admitting: Internal Medicine

## 2020-09-22 DIAGNOSIS — Z89512 Acquired absence of left leg below knee: Secondary | ICD-10-CM | POA: Diagnosis not present

## 2020-09-22 DIAGNOSIS — Z6841 Body Mass Index (BMI) 40.0 and over, adult: Secondary | ICD-10-CM | POA: Insufficient documentation

## 2020-09-22 DIAGNOSIS — E11621 Type 2 diabetes mellitus with foot ulcer: Secondary | ICD-10-CM | POA: Insufficient documentation

## 2020-09-22 DIAGNOSIS — L97512 Non-pressure chronic ulcer of other part of right foot with fat layer exposed: Secondary | ICD-10-CM | POA: Diagnosis not present

## 2020-09-22 DIAGNOSIS — E1151 Type 2 diabetes mellitus with diabetic peripheral angiopathy without gangrene: Secondary | ICD-10-CM | POA: Diagnosis not present

## 2020-09-22 DIAGNOSIS — Z87891 Personal history of nicotine dependence: Secondary | ICD-10-CM | POA: Insufficient documentation

## 2020-09-22 NOTE — Progress Notes (Signed)
[name], [name] ([id number]) Visit Report for 09/22/2020 Abuse/Suicide Risk Screen Details Patient Name: Date of Service: [name], [name] 09/22/2020 10:30 A M Medical Record Number: [id number] Patient Account Number: [id number] Date of Birth/Sex: Treating RN: Jun 17, 1976 (44 y.o. [name] Primary Care Rajni Holsworth: [name] Other Clinician: Referring Aleeya Veitch: Treating Donoven Pett/Extender: Mammie Russian, Ivan Anchors in Treatment: 0 Abuse/Suicide Risk Screen Items Answer ABUSE RISK SCREEN: Has anyone close to you tried to hurt or harm you recentlyo No Do you feel uncomfortable with anyone in your familyo No Has anyone forced you do things that you didnt want to doo No Electronic Signature(s) Signed: 09/22/2020 5:10:38 PM By: Baruch Gouty RN, BSN Entered By: Baruch Gouty on 09/22/2020 11:22:28 -------------------------------------------------------------------------------- Activities of Daily Living Details Patient Name: Date of Service: [name], [name] 09/22/2020 10:30 A M Medical Record Number: [id number] Patient Account Number: [id number] Date of Birth/Sex: Treating RN: [birth year] (44 y.o. [name] Primary Care Daquarius Dubeau: [name] Other Clinician: Referring Kylie Simmonds: Treating Deena Shaub/Extender: Mammie Russian, Ivan Anchors in Treatment: 0 Activities of Daily Living Items Answer Activities of Daily Living (Please select one for each item) Drive Automobile Not Able T Medications ake Completely Able Use T elephone Completely Able Care for Appearance Need Assistance Use T oilet Need Assistance Bath / Shower Need Assistance Dress Self Need Assistance Feed Self Completely Able Walk Need Assistance Get In / Out Bed Need Assistance Housework Need Assistance Prepare Meals Need Assistance Handle Money Completely Able Shop for Self Need Assistance Electronic Signature(s) Signed: 09/22/2020 5:10:38 PM By: Baruch Gouty RN,  BSN Entered By: Baruch Gouty on 09/22/2020 11:23:19 -------------------------------------------------------------------------------- Education Screening Details Patient Name: Date of Service: [name], [name] 09/22/2020 10:30 A M Medical Record Number: [id number] Patient Account Number: [id number] Date of Birth/Sex: Treating RN: 1976/08/01 (44 y.o. [name] Primary Care Joey Lierman: [name] Other Clinician: Referring Dahiana Kulak: Treating Tanyiah Laurich/Extender: Mammie Russian, Ivan Anchors in Treatment: 0 Primary Learner Assessed: Patient Learning Preferences/Education Level/Primary Language Learning Preference: Explanation, Demonstration, Printed Material Highest Education Level: High School Preferred Language: English Cognitive Barrier Language Barrier: No Translator Needed: No Memory Deficit: No Emotional Barrier: No Cultural/Religious Beliefs Affecting Medical Care: No Physical Barrier Impaired Vision: Yes Glasses Impaired Hearing: No Decreased Hand dexterity: No Knowledge/Comprehension Knowledge Level: High Comprehension Level: High Ability to understand written instructions: High Ability to understand verbal instructions: High Motivation Anxiety Level: Calm Cooperation: Cooperative Education Importance: Acknowledges Need Interest in Health Problems: Asks Questions Perception: Coherent Willingness to Engage in Self-Management High Activities: Readiness to Engage in Self-Management High Activities: Electronic Signature(s) Signed: 09/22/2020 5:10:38 PM By: Baruch Gouty RN, BSN Entered By: Baruch Gouty on 09/22/2020 11:23:48 -------------------------------------------------------------------------------- Fall Risk Assessment Details Patient Name: Date of Service: [name], [name] 09/22/2020 10:30 A M Medical Record Number: [id number] Patient Account Number: [id number] Date of Birth/Sex: Treating RN: [birth year] (44 y.o. [name] Primary Care Dondi Burandt: [name] Other Clinician: Referring Oluwadarasimi Favor: Treating Kaeleigh Westendorf/Extender: Mammie Russian, Ivan Anchors in Treatment: 0 Fall Risk Assessment Items Have you had 2 or more falls in the last 12 monthso 0 No Have you had any fall that resulted in injury in the last 12 monthso 0 No FALLS RISK SCREEN History of falling - immediate or within 3 months 0 No Secondary diagnosis (Do you have 2 or more medical diagnoseso) 15 Yes Ambulatory aid None/bed rest/wheelchair/nurse 0 No Crutches/cane/walker 15 Yes Furniture 0 No Intravenous therapy Access/Saline/Heparin Lock 0 No Gait/Transferring Normal/ bed rest/ wheelchair 0 No Weak (short steps with or without shuffle, stooped  but able to lift head while walking, may seek 10 Yes support from furniture) Impaired (short steps with shuffle, may have difficulty arising from chair, head down, impaired 0 No balance) Mental Status Oriented to own ability 0 Yes Electronic Signature(s) Signed: 09/22/2020 5:10:38 PM By: Baruch Gouty RN, BSN Entered By: Baruch Gouty on 09/22/2020 11:24:11 -------------------------------------------------------------------------------- Foot Assessment Details Patient Name: Date of Service: [name], [name] 09/22/2020 10:30 A M Medical Record Number: [id number] Patient Account Number: [id number] Date of Birth/Sex: Treating RN: 03-14-1976 (44 y.o. 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 Site Locations + = Sensation present, - = Sensation absent, C = Callus, U = Ulcer R = Redness, W = Warmth, M = Maceration, PU = Pre-ulcerative lesion F = Fissure, S = Swelling, D = Dryness Assessment Right: Left: Other Deformity: No Prior Foot Ulcer:  Yes Prior Amputation: Yes Charcot Joint: No Ambulatory Status: Ambulatory With Help Assistance Device: Walker Gait: Steady Electronic Signature(s) Signed: 09/22/2020 5:10:38 PM By: Baruch Gouty RN, BSN Entered By: Baruch Gouty on 09/22/2020 11:26:05 -------------------------------------------------------------------------------- Nutrition Risk Screening Details Patient Name: Date of Service: [name], [name] 09/22/2020 10:30 A M Medical Record Number: [id number] Patient Account Number: [id number] Date of Birth/Sex: Treating RN: 03-25-1976 (44 y.o. [name] Primary Care Sarp Vernier: [name] Other Clinician: Referring Kairyn Olmeda: Treating Kysha Muralles/Extender: Mammie Russian, Ivan Anchors in Treatment: 0 Height (in): 62 Weight (lbs): 362 Body Mass Index (BMI): 66.2 Nutrition Risk Screening Items Score Screening NUTRITION RISK SCREEN: I have an illness or condition that made me change the kind and/or amount of food I eat 0 No I eat fewer than two meals per day 0 No I eat few fruits and vegetables, or milk products 0 No I have three or more drinks of beer, liquor or wine almost every day 0 No I have tooth or mouth problems that make it hard for me to eat 0 No I don't always have enough money to buy the food I need 0 No I eat alone most of the time 0 No I take three or more different prescribed or over-the-counter drugs a day 1 Yes Without wanting to, I have lost or gained 10 pounds in the last six months 0 No I am not always physically able to shop, cook and/or feed myself 0 No Nutrition Protocols Good Risk Protocol 0 No interventions needed Moderate Risk Protocol High Risk Proctocol Risk Level: Good Risk Score: 1 Electronic Signature(s) Signed: 09/22/2020 5:10:38 PM By: Baruch Gouty RN, BSN Entered By: Baruch Gouty on 09/22/2020 11:24:31

## 2020-09-23 ENCOUNTER — Other Ambulatory Visit: Payer: Self-pay

## 2020-09-23 NOTE — Patient Instructions (Signed)
°  Hi [name],  As a part of your Medicaid benefit, you are eligible for care management and care coordination services at no cost or copay. I was unable to reach you by phone today but would be happy to help you with your health related needs. Please feel free to call me at [phone].  A member of the Managed Medicaid care management team will reach out to you again over the next 7 days.   Aida Raider RN, BSN [place]   [facility] - Managed Medicaid High Risk [phone]

## 2020-09-23 NOTE — Progress Notes (Signed)
[name] ([id number]) Visit Report for 09/22/2020 Chief Complaint Document Details Patient Name: Date of Service: [name] [facility] 09/22/2020 10:30 A M Medical Record Number: [id number] Patient Account Number: [id number] Date of Birth/Sex: Treating RN: [birth year] (44 y.o. [name] Primary Care Provider: [name] Other Clinician: Referring Provider: Treating Provider/Extender: Mammie Russian, Ivan Anchors in Treatment: 0 Information Obtained from: Patient Chief Complaint Right buttock, right foot amputation site, and LLQ abdomen ulcers 09/22/2020; patient returns to clinic with a wound on her right medial foot in the setting of a transmetatarsal amputation Electronic Signature(s) Signed: 09/23/2020 4:22:08 PM By: Linton Ham MD Entered By: Linton Ham on 09/22/2020 12:33:30 -------------------------------------------------------------------------------- Debridement Details Patient Name: Date of Service: [name] 09/22/2020 10:30 A M Medical Record Number: [id number] Patient Account Number: [id number] Date of Birth/Sex: Treating RN: 05-01-1976 (44 y.o. [name] Primary Care Provider: [name] Other Clinician: Referring Provider: Treating Provider/Extender: Mammie Russian, Ivan Anchors in Treatment: 0 Debridement Performed for Assessment: Wound #4 Right Amputation Site - Transmetatarsal Performed By: Physician Ricard Dillon., MD Debridement Type: Debridement Severity of Tissue Pre Debridement: Fat layer exposed Level of Consciousness (Pre-procedure): Awake and Alert Pre-procedure Verification/Time Out Yes - 11:57 Taken: Start Time: 11:57 T Area Debrided (L x W): otal 1 (cm) x 1.1 (cm) = 1.1 (cm) Tissue and other material debrided: Viable, Non-Viable, Slough, Subcutaneous, Slough Level: Skin/Subcutaneous Tissue Debridement Description: Excisional Instrument: Curette Bleeding: Minimum Hemostasis  Achieved: Pressure End Time: 11:58 Procedural Pain: 0 Post Procedural Pain: 0 Response to Treatment: Procedure was tolerated well Level of Consciousness (Post- Awake and Alert procedure): Post Debridement Measurements of Total Wound Length: (cm) 1 Width: (cm) 1.1 Depth: (cm) 0.3 Volume: (cm) 0.259 Character of Wound/Ulcer Post Debridement: Requires Further Debridement Severity of Tissue Post Debridement: Fat layer exposed Post Procedure Diagnosis Same as Pre-procedure Electronic Signature(s) Signed: 09/22/2020 5:42:40 PM By: Levan Hurst RN, BSN Signed: 09/23/2020 4:22:08 PM By: Linton Ham MD Entered By: Linton Ham on 09/22/2020 12:32:41 -------------------------------------------------------------------------------- HPI Details Patient Name: Date of Service: [name] 09/22/2020 10:30 A M Medical Record Number: [id number] Patient Account Number: [id number] Date of Birth/Sex: Treating RN: November 29, 1976 (44 y.o. [name] Primary Care Provider: [name] Other Clinician: Referring Provider: Treating Provider/Extender: Mammie Russian, Ivan Anchors in Treatment: 0 History of Present Illness HPI Description: 05/30/19 patient presents today for initial evaluation our clinic concerning significant wounds that she has in the right gluteal region as well as in the left abdominal area. She also has a pressure injury to her trans metatarsal amputation site on the right which does not appear to be as looking at this time but nonetheless does need to close. This occurred while she was in the hospital. The patient was admitted to the hospital on 05/17/19 discharged on 05/25/19. This was due to the significant abscess in the right gluteal region as well as in the left panus region. She has a history of diabetes which is definitely not under good control of the most useful view of an agency being 17.5. She also has hypertension, COPD, and is morbidly obese.  Unfortunately the wound and the right gluteal region extends right up and adjacent to the anus which is going to make the Wound VAC impossible at this point. Nonetheless I think that we're gonna have to basically pack this with wet to dry packing at this time in order to hopefully achieve improvement. Nonetheless this is a significant wound with her diabetes  be not under control along with her size and again the nature of the wound I think this is gonna take a very long time to heal. 07/04/2019 upon evaluation today patient actually appears to be doing better in regard to her wounds. Her foot ulcer actually appears to be completely healed which is great the abdominal ulcer has filled in quite nicely and seems to be doing well although there is a lot of moisture I think that she would benefit from a silver alginate dressing. Nonetheless in regard to the wound on her right gluteal region this showed signs of improvement today still this is a fairly significant ulcer unfortunately. 08/01/2019 on evaluation patient presents for follow-up concerning her wounds over the right gluteal region as well as the left abdominal region. Fortunately both areas are showing signs of improvement which is good news. In fact significant improvement. Spent about a month since have seen her last. Overall I am extremely happy though with the findings of how the wounds have progressed at this point. READMISSION 09/22/2020 This is a now 44 year old woman previously in this clinic for a right gluteal abscess for 3 or 4 visits in 2020. She is a poorly controlled type II diabetic with the last hemoglobin A1c I see in epic at 12.7 in July 2021. She tells [place] that she is here for review of a wound on the tip of her right first metatarsal transmetatarsal amputation. She had an angiogram and angioplasty on 12/20/2018 with angioplasty of her anterior tibial and posterior tibial arteries. She has been followed by Dr. Doran Durand most recent  treatment since August I think has been Santyl. She was given a 1 week course of Keflex in mid September. The patient's had an x-ray and also an MRI in August 2021 that did not show osteomyelitis but edema ABI in our clinic was 1.19 Past medical history includes hypertension, hyperlipidemia, COPD, bipolar disorder, poorly controlled type 2 diabetes with PAD. Left BKA, right transmetatarsal amputation, heart failure with preserved ejection fraction, she is minimally I am ambulatory walking only from her wheelchair to the bathroom with her walker. She only wears a stocking on her foot. Otherwise she is in wheelchair or in bed all day long. Electronic Signature(s) Signed: 09/23/2020 4:22:08 PM By: Linton Ham MD Entered By: Linton Ham on 09/22/2020 12:39:35 -------------------------------------------------------------------------------- Physical Exam Details Patient Name: Date of Service: [name] 09/22/2020 10:30 A M Medical Record Number: [id number] Patient Account Number: [id number] Date of Birth/Sex: Treating RN: Nov 27, 1976 (44 y.o. [name] Primary Care Provider: Other Clinician: [name] Referring Provider: Treating Provider/Extender: Mammie Russian, Ivan Anchors in Treatment: 0 Constitutional Sitting or standing Blood Pressure is within target range for patient.. Pulse regular and within target range for patient.Marland Kitchen Respirations regular, non-labored and within target range.. Temperature is normal and within the target range for the patient.Marland Kitchen Appears in no distress. Respiratory work of breathing is normal. Cardiovascular Dorsalis pedis and posterior tibial pulses are palpable on the right. Probably some degree of lymphedema but I do not think this is affecting her wound area.. Neurological Appears to have a right foot drop. Notes Wound exam; this is on the tip of her right first MTP. Using a #3 curette I debrided this area of subcutaneous  tissue to get a reasonably clean wound surface. There is no evidence of surrounding infection. This did not probe deeply. Hemostasis with direct pressure. Electronic Signature(s) Signed: 09/23/2020 4:22:08 PM By: Linton Ham MD Entered By: Linton Ham on  09/22/2020 12:42:03 -------------------------------------------------------------------------------- Physician Orders Details Patient Name: Date of Service: Damian, [facility] 09/22/2020 10:30 A M Medical Record Number: [id number] Patient Account Number: [id number] Date of Birth/Sex: Treating RN: 1976-03-16 (44 y.o. [name] Primary Care Provider: [name] Other Clinician: Referring Provider: Treating Provider/Extender: Mammie Russian, Ivan Anchors in Treatment: 0 Verbal / Phone Orders: No Diagnosis Coding Follow-up Appointments Return Appointment in 2 weeks. Dressing Change Frequency Wound #4 Right Amputation Site - Transmetatarsal Change dressing every day. Wound Cleansing Wound #4 Right Amputation Site - Transmetatarsal May shower and wash wound with soap and water. Primary Wound Dressing Wound #4 Right Amputation Site - Transmetatarsal Santyl Ointment Secondary Dressing Wound #4 Right Amputation Site - Transmetatarsal Kerlix/Rolled Gauze Dry Gauze Electronic Signature(s) Signed: 09/22/2020 5:42:40 PM By: Levan Hurst RN, BSN Signed: 09/23/2020 4:22:08 PM By: Linton Ham MD Entered By: Levan Hurst on 09/22/2020 12:02:28 -------------------------------------------------------------------------------- Problem List Details Patient Name: Date of Service: [name] [facility] 09/22/2020 10:30 A M Medical Record Number: [id number] Patient Account Number: [id number] Date of Birth/Sex: Treating RN: 28-Mar-1976 (44 y.o. [name] Primary Care Provider: [name] Other Clinician: Referring Provider: Treating Provider/Extender: Mammie Russian, Ivan Anchors in  Treatment: 0 Active Problems ICD-10 Encounter Code Description Active Date MDM Diagnosis E11.621 Type 2 diabetes mellitus with foot ulcer 09/22/2020 No Yes L97.512 Non-pressure chronic ulcer of other part of right foot with fat layer exposed 09/22/2020 No Yes Z89.512 Acquired absence of left leg below knee 09/22/2020 No Yes E11.51 Type 2 diabetes mellitus with diabetic peripheral angiopathy without gangrene 09/22/2020 No Yes Inactive Problems Resolved Problems Electronic Signature(s) Signed: 09/23/2020 4:22:08 PM By: Linton Ham MD Entered By: Linton Ham on 09/22/2020 12:42:31 -------------------------------------------------------------------------------- Progress Note Details Patient Name: Date of Service: [name] 09/22/2020 10:30 A M Medical Record Number: [id number] Patient Account Number: [id number] Date of Birth/Sex: Treating RN: [birth year] (44 y.o. [name] Primary Care Provider: [name] Other Clinician: Referring Provider: Treating Provider/Extender: Mammie Russian, Ivan Anchors in Treatment: 0 Subjective Chief Complaint Information obtained from Patient Right buttock, right foot amputation site, and LLQ abdomen ulcers 09/22/2020; patient returns to clinic with a wound on her right medial foot in the setting of a transmetatarsal amputation History of Present Illness (HPI) 05/30/19 patient presents today for initial evaluation our clinic concerning significant wounds that she has in the right gluteal region as well as in the left abdominal area. She also has a pressure injury to her trans metatarsal amputation site on the right which does not appear to be as looking at this time but nonetheless does need to close. This occurred while she was in the hospital. The patient was admitted to the hospital on 05/17/19 discharged on 05/25/19. This was due to the significant abscess in the right gluteal region as well as in the left panus  region. She has a history of diabetes which is definitely not under good control of the most useful view of an agency being 17.5. She also has hypertension, COPD, and is morbidly obese. Unfortunately the wound and the right gluteal region extends right up and adjacent to the anus which is going to make the Wound VAC impossible at this point. Nonetheless I think that we're gonna have to basically pack this with wet to dry packing at this time in order to hopefully achieve improvement. Nonetheless this is a significant wound with her diabetes be not under control along with her size and again the nature of the wound I  think this is gonna take a very long time to heal. 07/04/2019 upon evaluation today patient actually appears to be doing better in regard to her wounds. Her foot ulcer actually appears to be completely healed which is great the abdominal ulcer has filled in quite nicely and seems to be doing well although there is a lot of moisture I think that she would benefit from a silver alginate dressing. Nonetheless in regard to the wound on her right gluteal region this showed signs of improvement today still this is a fairly significant ulcer unfortunately. 08/01/2019 on evaluation patient presents for follow-up concerning her wounds over the right gluteal region as well as the left abdominal region. Fortunately both areas are showing signs of improvement which is good news. In fact significant improvement. Spent about a month since have seen her last. Overall I am extremely happy though with the findings of how the wounds have progressed at this point. READMISSION 09/22/2020 This is a now 44 year old woman previously in this clinic for a right gluteal abscess for 3 or 4 visits in 2020. She is a poorly controlled type II diabetic with the last hemoglobin A1c I see in epic at 12.7 in July 2021. She tells [place] that she is here for review of a wound on the tip of her right first metatarsal  transmetatarsal amputation. She had an angiogram and angioplasty on 12/20/2018 with angioplasty of her anterior tibial and posterior tibial arteries. She has been followed by Dr. Doran Durand most recent treatment since August I think has been Santyl. She was given a 1 week course of Keflex in mid September. The patient's had an x-ray and also an MRI in August 2021 that did not show osteomyelitis but edema ABI in our clinic was 1.19 Past medical history includes hypertension, hyperlipidemia, COPD, bipolar disorder, poorly controlled type 2 diabetes with PAD. Left BKA, right transmetatarsal amputation, heart failure with preserved ejection fraction, she is minimally I am ambulatory walking only from her wheelchair to the bathroom with her walker. She only wears a stocking on her foot. Otherwise she is in wheelchair or in bed all day long. Patient History Unable to Obtain Patient History due to Altered Mental Status. Information obtained from Patient. Allergies cefepime, Trental, Nubain, kiwi, morphine Family History Cancer - Maternal Grandparents, Diabetes - Mother, Heart Disease - Father,Paternal Grandparents, No family history of Hereditary Spherocytosis, Hypertension, Kidney Disease, Lung Disease, Seizures, Stroke, Thyroid Problems, Tuberculosis. Social History Former smoker, Marital Status - Separated, Alcohol Use - Never, Drug Use - No History, Caffeine Use - Moderate. Medical History Eyes Denies history of Cataracts, Glaucoma, Optic Neuritis Ear/Nose/Mouth/Throat Denies history of Chronic sinus problems/congestion, Middle ear problems Hematologic/Lymphatic Denies history of Anemia, Hemophilia, Human Immunodeficiency Virus, Lymphedema, Sickle Cell Disease Respiratory Patient has history of Asthma, Chronic Obstructive Pulmonary Disease (COPD), Sleep Apnea - uses Bipap with O2 Denies history of Aspiration, Pneumothorax, Tuberculosis Cardiovascular Patient has history of Congestive Heart  Failure, Hypertension, Peripheral Venous Disease Denies history of Angina, Arrhythmia, Coronary Artery Disease, Deep Vein Thrombosis, Hypotension, Myocardial Infarction, Peripheral Arterial Disease, Phlebitis, Vasculitis Gastrointestinal Denies history of Cirrhosis , Colitis, Crohnoos, Hepatitis A, Hepatitis B, Hepatitis C Endocrine Patient has history of Type II Diabetes Denies history of Type I Diabetes Genitourinary Denies history of End Stage Renal Disease Immunological Denies history of Lupus Erythematosus, Raynaudoos, Scleroderma Integumentary (Skin) Denies history of History of Burn Musculoskeletal Patient has history of Osteoarthritis, Osteomyelitis - left lower leg Denies history of Gout, Rheumatoid Arthritis Neurologic Patient has history  of Neuropathy Denies history of Dementia, Quadriplegia, Paraplegia, Seizure Disorder Oncologic Denies history of Received Chemotherapy, Received Radiation Psychiatric Denies history of Anorexia/bulimia, Confinement Anxiety Patient is treated with Insulin. Hospitalization/Surgery History - IandD perirectal abscess. - left BKA. - right transmetatarsal amputation. - excision of posterior neck abscess. - cardiac cath. - right knee arthroscopy. - c-section. Medical A Surgical History Notes nd Constitutional Symptoms (General Health) morbid obesity Cardiovascular hyperlipidemia Gastrointestinal GERD Psychiatric bipolar Review of Systems (ROS) Constitutional Symptoms (General Health) Denies complaints or symptoms of Fatigue, Fever, Chills, Marked Weight Change. Eyes Complains or has symptoms of Glasses / Contacts. Ear/Nose/Mouth/Throat Denies complaints or symptoms of Chronic sinus problems or rhinitis. Respiratory Complains or has symptoms of Shortness of Breath. Denies complaints or symptoms of Chronic or frequent coughs. Gastrointestinal Denies complaints or symptoms of Frequent diarrhea, Nausea,  Vomiting. Genitourinary Denies complaints or symptoms of Frequent urination. Integumentary (Skin) Complains or has symptoms of Wounds - right foot. Neurologic Complains or has symptoms of Numbness/parasthesias. Psychiatric Denies complaints or symptoms of Claustrophobia, Suicidal. Objective Constitutional Sitting or standing Blood Pressure is within target range for patient.. Pulse regular and within target range for patient.Marland Kitchen Respirations regular, non-labored and within target range.. Temperature is normal and within the target range for the patient.Marland Kitchen Appears in no distress. Vitals Time Taken: 11:12 AM, Height: 62 in, Source: Stated, Weight: 362 lbs, Source: Stated, BMI: 66.2, Temperature: 97.7 F, Pulse: 116 bpm, Respiratory Rate: 20 breaths/min, Blood Pressure: 140/80 mmHg. General Notes: pt reports blood sugar over 400 yesterday with dexicon Respiratory work of breathing is normal. Cardiovascular Dorsalis pedis and posterior tibial pulses are palpable on the right. Probably some degree of lymphedema but I do not think this is affecting her wound area.. Neurological Appears to have a right foot drop. General Notes: Wound exam; this is on the tip of her right first MTP. Using a #3 curette I debrided this area of subcutaneous tissue to get a reasonably clean wound surface. There is no evidence of surrounding infection. This did not probe deeply. Hemostasis with direct pressure. Integumentary (Hair, Skin) Wound #4 status is Open. Original cause of wound was Gradually Appeared. 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 The wound measures 1cm length x 1.1cm width x 0.3cm depth; 0.864cm^2 area and 0.259cm^3 volume. There is Fat Layer (Subcutaneous Tissue) exposed. There is no tunneling noted, however, there is undermining starting at 2:00 and ending at 2:00 with a maximum distance of 0.3cm. There is a small amount of serosanguineous drainage noted. The wound  margin is distinct with the outline attached to the wound base. There is large (67-100%) red granulation within the wound bed. There is a small (1-33%) amount of necrotic tissue within the wound bed including Adherent Slough. 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 There was a Excisional Skin/Subcutaneous Tissue Debridement with a total area of 1.1 sq cm performed by Ricard Dillon., MD. With the following instrument(s): Curette to remove Viable and Non-Viable tissue/material. Material removed includes Subcutaneous Tissue and Slough and. No specimens were taken. A time out was conducted at 11:57, prior to  the start of the procedure. A Minimum amount of bleeding was controlled with Pressure. The procedure was tolerated well with a pain level of 0 throughout and a pain level of 0 following the procedure. Post Debridement Measurements: 1cm length x 1.1cm width x 0.3cm depth; 0.259cm^3 volume. Character of Wound/Ulcer Post Debridement requires further debridement. Severity of Tissue Post Debridement is: Fat layer exposed. Post procedure Diagnosis Wound #4: Same as Pre-Procedure Plan Follow-up Appointments: Return Appointment in 2 weeks. Dressing Change Frequency: Wound #4 Right Amputation Site - Transmetatarsal: Change dressing every day. Wound Cleansing: Wound #4 Right Amputation Site - Transmetatarsal: May shower and wash wound with soap and water. Primary Wound Dressing: Wound #4 Right Amputation Site - Transmetatarsal: Santyl  Ointment Secondary Dressing: Wound #4 Right Amputation Site - Transmetatarsal: Kerlix/Rolled Gauze Dry Gauze I will can1. With debridement the wound cleans up quite nicely. T new the Santyl. o 2. Not particularly certain what is causing the difficulty healing this. Certainly she is a fairly poorly controlled diabetic tells [place] that her CBG she did fasting yesterday registered is over 400. She does have PAD and is status post angioplasty of the anterior tibial and posterior tibial arteries although her pulses in the foot are robustly palpable. 3. She says that the wound has been there since April. Not exactly sure when the transmit was done. Her left BKA was in May 2019. 4. We are going to go ahead and continue the Santyl at this point to see if we can clean up the wound surface enough to get epithelialization. At this point she has had an x-ray and an MRI showing no osteo-. I did not think another imaging test was indicated. Her ABI in our clinic was 1.19 suggesting she has not really had any deterioration in her successful angioplasties that were done in July 5. She had a DVT rule out done in July 2021 I will see her again in 2 weeks. I spent 35 minutes in review of this patient's past medical history, face-to-face evaluation and preparation of this record Electronic Signature(s) Signed: 09/23/2020 4:22:08 PM By: Linton Ham MD Entered By: Linton Ham on 09/22/2020 12:45:50 -------------------------------------------------------------------------------- HxROS Details Patient Name: Date of Service: Richard, Holz 09/22/2020 10:30 A M Medical Record Number: [id number] Patient Account Number: [id number] Date of Birth/Sex: Treating RN: [birth year] (44 y.o. [name] Primary Care Provider: [name] Other Clinician: Referring Provider: Treating Provider/Extender: Mammie Russian, Ivan Anchors in Treatment: 0 Unable to Obtain Patient History due to Altered  Mental Status Information Obtained From Patient Constitutional Symptoms (General Health) Complaints and Symptoms: Negative for: Fatigue; Fever; Chills; Marked Weight Change Medical History: Past Medical History Notes: morbid obesity Eyes Complaints and Symptoms: Positive for: Glasses / Contacts Medical History: Negative for: Cataracts; Glaucoma; Optic Neuritis Ear/Nose/Mouth/Throat Complaints and Symptoms: Negative for: Chronic sinus problems or rhinitis Medical History: Negative for: Chronic sinus problems/congestion; Middle ear problems Respiratory Complaints and Symptoms: Positive for: Shortness of Breath Negative for: Chronic or frequent coughs Medical History: Positive for: Asthma; Chronic Obstructive Pulmonary Disease (COPD); Sleep Apnea - uses Bipap with O2 Negative for: Aspiration; Pneumothorax; Tuberculosis Gastrointestinal Complaints and Symptoms: Negative for: Frequent diarrhea; Nausea; Vomiting Medical History: Negative for: Cirrhosis ; Colitis; Crohns; Hepatitis A; Hepatitis B; Hepatitis C Past Medical History Notes: GERD Genitourinary Complaints and Symptoms: Negative for: Frequent urination Medical History: Negative for: End Stage Renal Disease Integumentary (Skin) Complaints and Symptoms: Positive for: Wounds - right foot Medical History: Negative for: History of Burn Neurologic Complaints and  Symptoms: Positive for: Numbness/parasthesias Medical History: Positive for: Neuropathy Negative for: Dementia; Quadriplegia; Paraplegia; Seizure Disorder Psychiatric Complaints and Symptoms: Negative for: Claustrophobia; Suicidal Medical History: Negative for: Anorexia/bulimia; Confinement Anxiety Past Medical History Notes: bipolar Hematologic/Lymphatic Medical History: Negative for: Anemia; Hemophilia; Human Immunodeficiency Virus; Lymphedema; Sickle Cell Disease Cardiovascular Medical History: Positive for: Congestive Heart Failure; Hypertension;  Peripheral Venous Disease Negative for: Angina; Arrhythmia; Coronary Artery Disease; Deep Vein Thrombosis; Hypotension; Myocardial Infarction; Peripheral Arterial Disease; Phlebitis; Vasculitis Past Medical History Notes: hyperlipidemia Endocrine Medical History: Positive for: Type II Diabetes Negative for: Type I Diabetes Time with diabetes: 20 years Treated with: Insulin Immunological Medical History: Negative for: Lupus Erythematosus; Raynauds; Scleroderma Musculoskeletal Medical History: Positive for: Osteoarthritis; Osteomyelitis - left lower leg Negative for: Gout; Rheumatoid Arthritis Oncologic Medical History: Negative for: Received Chemotherapy; Received Radiation Immunizations Pneumococcal Vaccine: Received Pneumococcal Vaccination: No Implantable Devices Yes Hospitalization / Surgery History Type of Hospitalization/Surgery IandD perirectal abscess left BKA right transmetatarsal amputation excision of posterior neck abscess cardiac cath right knee arthroscopy c-section Family and Social History Cancer: Yes - Maternal Grandparents; Diabetes: Yes - Mother; Heart Disease: Yes - Father,Paternal Grandparents; Hereditary Spherocytosis: No; Hypertension: No; Kidney Disease: No; Lung Disease: No; Seizures: No; Stroke: No; Thyroid Problems: No; Tuberculosis: No; Former smoker; Marital Status - Separated; Alcohol Use: Never; Drug Use: No History; Caffeine Use: Moderate; Financial Concerns: No; Food, Clothing or Shelter Needs: No; Support System Lacking: No; Transportation Concerns: No Engineer, maintenance) Signed: 09/22/2020 5:10:38 PM By: Baruch Gouty RN, BSN Signed: 09/23/2020 4:22:08 PM By: Linton Ham MD Entered By: Baruch Gouty on 09/22/2020 11:22:18 -------------------------------------------------------------------------------- SuperBill Details Patient Name: Date of Service: Belinda Dolin, TA [facility] 09/22/2020 Medical Record Number: [id number] Patient  Account Number: [id number] Date of Birth/Sex: Treating RN: Jan 03, 1976 (44 y.o. 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.512 Acquired absence of left leg below knee E11.51 Type 2 diabetes mellitus with diabetic peripheral angiopathy without gangrene Facility Procedures CPT4 Code: [id number] Description: Kenneth VISIT-LEV 3 EST PT Modifier: 25 Quantity: 1 CPT4 Code: [id number] Description: [place] - DEB SUBQ TISSUE 20 SQ CM/< ICD-10 Diagnosis Description L97.512 Non-pressure chronic ulcer of other part of right foot with fat layer exposed Modifier: Quantity: 1 Physician Procedures : CPT4 Code Description Modifier [id number] - WC PHYS LEVEL 4 - EST PT ICD-10 Diagnosis Description E11.621 Type 2 diabetes mellitus with foot ulcer L97.512 Non-pressure chronic ulcer of other part of right foot with fat layer exposed Z89.512 Acquired  absence of left leg below knee E11.51 Type 2 diabetes mellitus with diabetic peripheral angiopathy without gangrene Quantity: 1 : [id number] - WC PHYS SUBQ TISS 20 SQ CM ICD-10 Diagnosis Description L97.512 Non-pressure chronic ulcer of other part of right foot with fat layer exposed Quantity: 1 Electronic Signature(s) Signed: 09/22/2020 5:42:40 PM By: Levan Hurst RN, BSN Signed: 09/23/2020 4:22:08 PM By: Linton Ham MD Entered By: Levan Hurst on 09/22/2020 13:35:38

## 2020-09-23 NOTE — Patient Outreach (Signed)
Care Coordination  09/23/2020  [name] 01/13/1976 [id number]  A second unsuccessful telephone outreach was attempted today. The patient was referred to the case management team for assistance with care management and care coordination.   Follow Up Plan: The Managed Medicaid care management team will reach out to the patient again over the next 7 days.   Aida Raider RN, BSN [place]  [facility] [employer] - Managed Medicaid High Risk [phone]

## 2020-09-23 NOTE — Progress Notes (Signed)
[name], [name] ([id number]) Visit Report for 09/22/2020 Allergy List Details Patient Name: Date of Service: [name], [name] 09/22/2020 10:30 A M Medical Record Number: [id number] Patient Account Number: [id number] Date of Birth/Sex: Treating RN: Jul 28, 1976 (44 y.o. Clearnce Sorrel Primary Care Niemah Schwebke: [name] Other Clinician: Referring Lindora Alviar: Treating Ashtan Laton/Extender: Mammie Russian, Ivan Anchors in Treatment: 0 Allergies Active Allergies cefepime Trental Nubain kiwi morphine Allergy Notes Electronic Signature(s) Signed: 09/22/2020 5:10:38 PM By: Baruch Gouty RN, BSN Entered By: Baruch Gouty on 09/22/2020 11:14:34 -------------------------------------------------------------------------------- Arrival Information Details Patient Name: Date of Service: [name], [name] 09/22/2020 10:30 A M Medical Record Number: [id number] Patient Account Number: [id number] Date of Birth/Sex: Treating RN: 05/07/1976 (43 y.o. [name] Primary Care Manan Olmo: [name] Other Clinician: Referring Caylyn Tedeschi: Treating Niall Illes/Extender: Mammie Russian, Ivan Anchors in Treatment: 0 Visit Information Patient Arrived: Wheel Chair Arrival Time: 11:10 Accompanied By: friend Transfer Assistance: None Patient Identification Verified: Yes Secondary Verification Process Completed: Yes Patient Requires Transmission-Based Precautions: No Patient Has Alerts: No History Since Last Visit Added or deleted any medications: No Electronic Signature(s) Signed: 09/22/2020 5:10:38 PM By: Baruch Gouty RN, BSN Entered By: Baruch Gouty on 09/22/2020 11:12:01 -------------------------------------------------------------------------------- Clinic Level of Care Assessment Details Patient Name: Date of Service: [name] 09/22/2020 10:30 A M Medical Record Number: [id number] Patient Account Number: [id number] Date of Birth/Sex: Treating  RN: Aug 10, 1976 (44 y.o. [name] Primary Care Caymen Dubray: [name] Other Clinician: Referring Katriona Schmierer: Treating Zorana Brockwell/Extender: Mammie Russian, Ivan Anchors in Treatment: 0 Clinic Level of Care Assessment Items TOOL 1 Quantity Score X- 1 0 Use when EandM and Procedure is performed on INITIAL visit ASSESSMENTS - Nursing Assessment / Reassessment X- 1 20 General Physical Exam (combine w/ comprehensive assessment (listed just below) when performed on new pt. evals) X- 1 25 Comprehensive Assessment (HX, ROS, Risk Assessments, Wounds Hx, etc.) ASSESSMENTS - Wound and Skin Assessment / Reassessment []  - 0 Dermatologic / Skin Assessment (not related to wound area) ASSESSMENTS - Ostomy and/or Continence Assessment and Care []  - 0 Incontinence Assessment and Management []  - 0 Ostomy Care Assessment and Management (repouching, etc.) PROCESS - Coordination of Care X - Simple Patient / Family Education for ongoing care 1 15 []  - 0 Complex (extensive) Patient / Family Education for ongoing care X- 1 10 Staff obtains Programmer, systems, Records, T Results / Process Orders est []  - 0 Staff telephones HHA, Nursing Homes / Clarify orders / etc []  - 0 Routine Transfer to another Facility (non-emergent condition) []  - 0 Routine Hospital Admission (non-emergent condition) X- 1 15 New Admissions / Biomedical engineer / Ordering NPWT Apligraf, etc. , []  - 0 Emergency Hospital Admission (emergent condition) PROCESS - Special Needs []  - 0 Pediatric / Minor Patient Management []  - 0 Isolation Patient Management []  - 0 Hearing / Language / Visual special needs []  - 0 Assessment of Community assistance (transportation, D/C planning, etc.) []  - 0 Additional assistance / Altered mentation []  - 0 Support Surface(s) Assessment (bed, cushion, seat, etc.) INTERVENTIONS - Miscellaneous []  - 0 External ear exam []  - 0 Patient Transfer (multiple staff / Civil Service fast streamer /  Similar devices) []  - 0 Simple Staple / Suture removal (25 or less) []  - 0 Complex Staple / Suture removal (26 or more) []  - 0 Hypo/Hyperglycemic Management (do not check if billed separately) X- 1 15 Ankle / Brachial Index (ABI) - do not check if billed separately Has the patient been seen at the hospital within the last three  years: Yes Total Score: 100 Level Of Care: New/Established - Level 3 Electronic Signature(s) Signed: 09/22/2020 5:42:40 PM By: Levan Hurst RN, BSN Signed: 09/22/2020 5:42:40 PM By: Levan Hurst RN, BSN Entered By: Levan Hurst on 09/22/2020 13:35:22 -------------------------------------------------------------------------------- Encounter Discharge Information Details Patient Name: Date of Service: [name], [name] 09/22/2020 10:30 A M Medical Record Number: [id number] Patient Account Number: [id number] Date of Birth/Sex: Treating RN: 11-Feb-1976 (44 y.o. [name] Primary Care Anjanette Gilkey: [name] Other Clinician: Referring Deauna Yaw: Treating Breya Cass/Extender: Mammie Russian, Ivan Anchors in Treatment: 0 Encounter Discharge Information Items Post Procedure Vitals Discharge Condition: Stable Temperature (F): 97.7 Ambulatory Status: Wheelchair Pulse (bpm): 116 Discharge Destination: Home Respiratory Rate (breaths/min): 20 Transportation: Private Auto Blood Pressure (mmHg): 140/80 Accompanied By: friend Schedule Follow-up Appointment: Yes Clinical Summary of Care: Patient Declined Electronic Signature(s) Signed: 09/22/2020 5:10:38 PM By: Baruch Gouty RN, BSN Entered By: Baruch Gouty on 09/22/2020 12:19:40 -------------------------------------------------------------------------------- Lower Extremity Assessment Details Patient Name: Date of Service: [name], [name] 09/22/2020 10:30 A M Medical Record Number: [id number] Patient Account Number: [id number] Date of Birth/Sex: Treating RN: 1976/04/28 (44 y.o. [name] Primary Care Lizandra Zakrzewski: [name] Other Clinician: Referring Otha Monical: Treating Verdella Laidlaw/Extender: Mammie Russian, Ivan Anchors in Treatment: 0 Vascular Assessment Pulses: Dorsalis Pedis Palpable: [Right:Yes] Blood Pressure: Brachial: [Right:140] Ankle: [Right:Dorsalis Pedis: 166 1.19] Electronic Signature(s) Signed: 09/22/2020 5:10:38 PM By: Baruch Gouty RN, BSN Entered By: Baruch Gouty on 09/22/2020 11:32:05 -------------------------------------------------------------------------------- Multi Wound Chart Details Patient Name: Date of Service: [name], [name] 09/22/2020 10:30 A M Medical Record Number: [id number] Patient Account Number: [id number] Date of Birth/Sex: Treating RN: 05-30-1976 (44 y.o. [name] Primary Care Amilia Vandenbrink: [name] Other Clinician: Referring Doneta Bayman: Treating Jariel Drost/Extender: Mammie Russian, Ivan Anchors in Treatment: 0 Vital Signs Height(in): 14 Pulse(bpm): 116 Weight(lbs): 49 Blood Pressure(mmHg): 140/80 Body Mass Index(BMI): 66 Temperature(F): 97.7 Respiratory Rate(breaths/min): 20 Photos: [4:No Photos Right Amputation Site -] [N/A:N/A N/A] Wound Location: [4:Transmetatarsal Gradually Appeared] [N/A:N/A] Wounding Event: [4:Diabetic Wound/Ulcer of the Lower] [N/A:N/A] Primary Etiology: [4:Extremity Asthma, Chronic Obstructive] [N/A:N/A] Comorbid History: [4:Pulmonary Disease (COPD), Sleep Apnea, Congestive Heart Failure, Hypertension, Peripheral Venous Disease, Type II Diabetes, Osteoarthritis, Osteomyelitis, Neuropathy 03/06/2020] [N/A:N/A] Date Acquired: [4:0] [N/A:N/A] Weeks of Treatment: [4:Open] [N/A:N/A] Wound Status: [4:1x1.1x0.3] [N/A:N/A] Measurements L x W x D (cm) [4:0.864] [N/A:N/A] A (cm) : rea [4:0.259] [N/A:N/A] Volume (cm) : [4:2] Starting Position 1 (o'clock): [4:2] Ending Position 1 (o'clock): [4:0.3] Maximum Distance 1 (cm): [4:Yes]  [N/A:N/A] Undermining: [4:Grade 1] [N/A:N/A] Classification: [4:Small] [N/A:N/A] Exudate A mount: [4:Serosanguineous] [N/A:N/A] Exudate Type: [4:red, brown] [N/A:N/A] Exudate Color: [4:Distinct, outline attached] [N/A:N/A] Wound Margin: [4:Large (67-100%)] [N/A:N/A] Granulation A mount: [4:Red] [N/A:N/A] Granulation Quality: [4:Small (1-33%)] [N/A:N/A] Necrotic A mount: [4:Fat Layer (Subcutaneous Tissue): Yes N/A] Exposed Structures: [4:Fascia: No Tendon: No Muscle: No Joint: No Bone: No None] [N/A:N/A] Epithelialization: [4:Debridement - Excisional] [N/A:N/A] Debridement: Pre-procedure Verification/Time Out 11:57 [N/A:N/A] Taken: [4:Subcutaneous, Slough] [N/A:N/A] Tissue Debrided: [4:Skin/Subcutaneous Tissue] [N/A:N/A] Level: [4:1.1] [N/A:N/A] Debridement A (sq cm): [4:rea Curette] [N/A:N/A] Instrument: [4:Minimum] [N/A:N/A] Bleeding: [4:Pressure] [N/A:N/A] Hemostasis A chieved: [4:0] [N/A:N/A] Procedural Pain: [4:0] [N/A:N/A] Post Procedural Pain: [4:Procedure was tolerated well] [N/A:N/A] Debridement Treatment Response: [4:1x1.1x0.3] [N/A:N/A] Post Debridement Measurements L x W x D (cm) [4:0.259] [N/A:N/A] Post Debridement Volume: (cm) [4:Debridement] [N/A:N/A] Treatment Notes Wound #4 (Right Amputation Site - Transmetatarsal) 3. Primary Dressing Applied Santyl 4. Secondary Dressing Dry Gauze Roll Gauze 5. Secured With Recruitment consultant) Signed: 09/22/2020 5:42:40 PM By: Levan Hurst RN, BSN Signed: 09/23/2020 4:22:08 PM By:  Linton Ham MD Entered By: Linton Ham on 09/22/2020 12:32:24 -------------------------------------------------------------------------------- Multi-Disciplinary Care Plan Details Patient Name: Date of Service: Belinda Mings [facility] 09/22/2020 10:30 A M Medical Record Number: [id number] Patient Account Number: [id number] Date of Birth/Sex: Treating RN: 1976-03-31 (44 y.o. [name] Primary Care Phyliss Hulick: [name] Other Clinician: Referring Noralyn Karim: Treating Demetrios Byron/Extender: Mammie Russian, Ivan Anchors in Treatment: 0 Active Inactive Abuse / Safety / Falls / Self Care Management Nursing Diagnoses: History of Falls Potential for injury related to falls Goals: Patient will not experience any injury related to falls Date Initiated: 09/22/2020 Target Resolution Date: 10/24/2020 Goal Status: Active Patient/caregiver will verbalize/demonstrate measures taken to prevent injury and/or falls Date Initiated: 09/22/2020 Target Resolution Date: 10/24/2020 Goal Status: Active Interventions: Assess Activities of Daily Living upon admission and as needed Assess fall risk on admission and as needed Assess: immobility, friction, shearing, incontinence upon admission and as needed Assess impairment of mobility on admission and as needed per policy Assess personal safety and home safety (as indicated) on admission and as needed Assess self care needs on admission and as needed Provide education on fall prevention Notes: Nutrition Nursing Diagnoses: Impaired glucose control: actual or potential Potential for alteratiion in Nutrition/Potential for imbalanced nutrition Goals: Patient/caregiver agrees to and verbalizes understanding of need to use nutritional supplements and/or vitamins as prescribed Date Initiated: 09/22/2020 Target Resolution Date: 10/24/2020 Goal Status: Active Patient/caregiver will maintain therapeutic glucose control Date Initiated: 09/22/2020 Target Resolution Date: 10/24/2020 Goal Status: Active Interventions: Assess HgA1c results as ordered upon admission and as needed Assess patient nutrition upon admission and as needed per policy Provide education on elevated blood sugars and impact on wound healing Provide education on nutrition Notes: Wound/Skin Impairment Nursing Diagnoses: Impaired tissue integrity Knowledge deficit related to  ulceration/compromised skin integrity Goals: Patient/caregiver will verbalize understanding of skin care regimen Date Initiated: 09/22/2020 Target Resolution Date: 10/24/2020 Goal Status: Active Ulcer/skin breakdown will have a volume reduction of 30% by week 4 Date Initiated: 09/22/2020 Target Resolution Date: 10/24/2020 Goal Status: Active Interventions: Assess patient/caregiver ability to obtain necessary supplies Assess patient/caregiver ability to perform ulcer/skin care regimen upon admission and as needed Assess ulceration(s) every visit Provide education on ulcer and skin care Notes: Electronic Signature(s) Signed: 09/22/2020 5:42:40 PM By: Levan Hurst RN, BSN Entered By: Levan Hurst on 09/22/2020 13:34:34 -------------------------------------------------------------------------------- Pain Assessment Details Patient Name: Date of Service: [name], [name] 09/22/2020 10:30 A M Medical Record Number: [id number] Patient Account Number: [id number] Date of Birth/Sex: Treating RN: 1976-09-26 (45 y.o. [name] Primary Care Malic Rosten: [name] Other Clinician: Referring Kamir Selover: Treating Carvin Almas/Extender: Mammie Russian, Ivan Anchors in Treatment: 0 Active Problems Location of Pain Severity and Description of Pain Patient Has Paino No Site Locations Rate the pain. Current Pain Level: 0 Pain Management and Medication Current Pain Management: Electronic Signature(s) Signed: 09/22/2020 5:10:38 PM By: Baruch Gouty RN, BSN Entered By: Baruch Gouty on 09/22/2020 11:37:46 -------------------------------------------------------------------------------- Patient/Caregiver Education Details Patient Name: Date of Service: Belinda [facility] 10/18/2021andnbsp10:30 A M Medical Record Number: [id number] Patient Account Number: [id number] Date of Birth/Gender: Treating RN: 04/24/1976 (44 y.o. [name] Primary Care Physician: [name] Other Clinician: Referring Physician: Treating Physician/Extender: Mammie Russian, Ivan Anchors in Treatment: 0 Education Assessment Education Provided To: Patient Education Topics Provided Elevated Blood Sugar/ Impact on Healing: Methods: Explain/Verbal Responses: State content correctly Nutrition: Methods: Explain/Verbal Responses: State content correctly Safety: Methods: Explain/Verbal Responses: State content correctly Wound/Skin Impairment: Methods: Explain/Verbal Responses: State content correctly  Electronic Signature(s) Signed: 09/22/2020 5:42:40 PM By: Levan Hurst RN, BSN Entered By: Levan Hurst on 09/22/2020 13:34:51 -------------------------------------------------------------------------------- Wound Assessment Details Patient Name: Date of Service: [name], [name] 09/22/2020 10:30 A M Medical Record Number: [id number] Patient Account Number: [id number] Date of Birth/Sex: Treating RN: 11-10-1976 (44 y.o. [name] Primary Care Kees Idrovo: [name] Other Clinician: Referring Jacky Hartung: Treating Tel Hevia/Extender: Mammie Russian, Ivan Anchors in Treatment: 0 Wound Status Wound Number: 4 Primary Diabetic Wound/Ulcer of the Lower Extremity Etiology: Wound Location: Right Amputation Site - Transmetatarsal Wound Open Wounding Event: Gradually Appeared Status: Date Acquired: 03/06/2020 Comorbid Asthma, Chronic Obstructive Pulmonary Disease (COPD), Sleep Weeks Of Treatment: 0 History: Apnea, Congestive Heart Failure, Hypertension, Peripheral Venous Clustered Wound: No Disease, Type II Diabetes, Osteoarthritis, Osteomyelitis, Neuropathy Photos Photo Uploaded By: Mikeal Hawthorne on 09/23/2020 13:13:38 Wound Measurements Length: (cm) 1 Width: (cm) 1.1 Depth: (cm) 0.3 Area: (cm) 0.864 Volume: (cm) 0.259 % Reduction in Area: % Reduction in Volume: Epithelialization: None Tunneling: No Undermining:  Yes Starting Position (o'clock): 2 Ending Position (o'clock): 2 Maximum Distance: (cm) 0.3 Wound Description Classification: Grade 1 Wound Margin: Distinct, outline attached Exudate Amount: Small Exudate Type: Serosanguineous Exudate Color: red, brown Foul Odor After Cleansing: No Slough/Fibrino No Wound Bed Granulation Amount: Large (67-100%) Exposed Structure Granulation Quality: Red Fascia Exposed: No Necrotic Amount: Small (1-33%) Fat Layer (Subcutaneous Tissue) Exposed: Yes Necrotic Quality: Adherent Slough Tendon Exposed: No Muscle Exposed: No Joint Exposed: No Bone Exposed: No Treatment Notes Wound #4 (Right Amputation Site - Transmetatarsal) 3. Primary Dressing Applied Santyl 4. Secondary Dressing Dry Gauze Roll Gauze 5. Secured With Recruitment consultant) Signed: 09/22/2020 5:10:38 PM By: Baruch Gouty RN, BSN Entered By: Baruch Gouty on 09/22/2020 11:34:38 -------------------------------------------------------------------------------- Vitals Details Patient Name: Date of Service: [name], [name] 09/22/2020 10:30 A M Medical Record Number: [id number] Patient Account Number: [id number] Date of Birth/Sex: Treating RN: May 13, 1976 (44 y.o. [name] Primary Care Brittin Belnap: [name] Other Clinician: Referring Timiya Howells: Treating Kemaria Dedic/Extender: Mammie Russian, Ivan Anchors in Treatment: 0 Vital Signs Time Taken: 11:12 Temperature (F): 97.7 Height (in): 62 Pulse (bpm): 116 Source: Stated Respiratory Rate (breaths/min): 20 Weight (lbs): 362 Blood Pressure (mmHg): 140/80 Source: Stated Reference Range: 80 - 120 mg / dl Body Mass Index (BMI): 66.2 Notes pt reports blood sugar over 400 yesterday with dexicon Electronic Signature(s) Signed: 09/22/2020 5:10:38 PM By: Baruch Gouty RN, BSN Entered By: Baruch Gouty on 09/22/2020 11:14:08

## 2020-09-25 NOTE — Progress Notes (Deleted)
Cardiology Office Note:    Date:  09/25/2020   ID:  Glynis Hunsucker, DOB 05/26/1976, MRN [id number]  PCP:  [name], MD  [facility] Cardiologist:  Sherren Mocha, MD   [place] Electrophysiologist:  None   Referring MD: [name], MD   Chief Complaint:  *** No chief complaint on file.    Patient Profile:    [name] is a 44 y.o. female with:   Normal coronary arteries by cath in 2013  Morbid obesity   Diabetes mellitus, uncontrolled w/ neuropathy   Hx of diabetic foot ulcers  S/p L BKA  S/p R trans-met amputation  Asthma/COPD   Bipolar d/o  Hyperlipidemia   Hypertension   OSA on CPAP  GERD  Prior CV studies: Echocardiogram 06/15/20 EF 55-60, no RWMA, normal diastolic function  Myoview 05/13/20 EF 56, no infarct or ischemia, low risk  ABIs 09/01/17 R normal but may be inaccurate due to calcified vessels L non compressible due to calcified vessels  Holter 08/2017 NSR, avg HR 93, frequent PVCs (4%), occ vent couplets; no sustained arrhythmia   Cardiac catheterization 07/27/12 Normal coronary arteries   History of Present Illness:    [name] had a Myoview in 6/21 that was low risk and neg for ischemia.  She has been admitted several times in the last few months with hypoxic respiratory failure.  She was seen in the ED in August with shortness of breath in the setting of SVT with HR 153.  I saw her 09/09/20.  She had been placed back on Torsemide by her pulmonologist due to volume excess.  I increased her dose further at her appt.  She had symptoms of chest pain and I reviewed her case with Dr. Burt Knack.  He did not feel that she needed cardiac catheterization.  She returns for follow up.  ***  Past Medical History:  Diagnosis Date  . Acute osteomyelitis of ankle or foot, left ([place]) 01/31/2018  . Alveolar hypoventilation   . Anemia    not on iron pill  . Asthma   . Bipolar 2 disorder ([place])   . Carpal tunnel syndrome on right      recurrent  . Cellulitis 08/2010-08/2011  . Chronic pain   . COPD (chronic obstructive pulmonary disease) ([name])    Symbicort daily and Proventil as needed  . Costochondritis   . Diabetes mellitus type II, uncontrolled ([place]) 2000   Type 2, Uncontrolled.Takes Lantus daily.Fasting blood sugar runs 150  . Drug-seeking behavior   . GERD (gastroesophageal reflux disease)    takes Pantoprazole and Zantac daily  . HLD (hyperlipidemia)    takes Atorvastatin daily  . Hypertension    takes Lisinopril and Coreg daily  . Morbid obesity ([place])   . Nocturia   . OSA on CPAP   . Peripheral neuropathy    takes Gabapentin daily  . Pneumonia    "walking" several yrs ago and as a baby (12/05/2018)  . Rectal fissure   . Restless leg   . SVT (supraventricular tachycardia) ([place])   . Syncope 02/25/2016  . Urinary frequency   . Varicose veins    Right medial thigh and Left leg     Current Medications: No outpatient medications have been marked as taking for the 09/26/20 encounter (Appointment) with Richardson Dopp T, PA-C.     Allergies:   Cefepime, Kiwi extract, Morphine and related, Trental [pentoxifylline], and Nubain [nalbuphine hcl]   Social History   Tobacco Use  .  Smoking status: Former Smoker    Packs/day: 0.25    Years: 15.00    Pack years: 3.75    Types: Cigarettes    Quit date: 12/06/2005    Years since quitting: 14.8  . Smokeless tobacco: Never Used  . Tobacco comment: smokes for a couple of months  Vaping Use  . Vaping Use: Never used  Substance Use Topics  . Alcohol use: No    Alcohol/week: 0.0 standard drinks  . Drug use: Yes    Comment: OD attempts on home meds       Family Hx: The patient's family history includes Allergic rhinitis in her mother; Anxiety disorder in her sister; Asthma in her child; Cancer in her maternal grandmother; Depression in her mother; Diabetes in her maternal grandmother and mother; GER disease in her mother; Heart attack in her father, paternal  grandfather, paternal grandmother, and paternal uncle; Heart disease in her paternal grandfather and paternal grandmother; Hyperlipidemia in her maternal grandmother and mother; Hypertension in her maternal grandmother; Migraines in her sister; Other in her maternal grandfather; Restless legs syndrome in her mother.  ROS  See HPI   EKGs/Labs/Other Test Reviewed:    EKG:  EKG is  *** ordered today.  The ekg ordered today demonstrates ***  Recent Labs: 06/17/2020: TSH 2.852 07/18/2020: ALT 14; B Natriuretic Peptide 44.1; Hemoglobin 12.0; Platelets 330 09/09/2020: BUN 18; Creatinine, Ser 0.94; Potassium 4.6; Sodium 133   Recent Lipid Panel Lab Results  Component Value Date/Time   CHOL 274 (H) 04/10/2020 12:00 PM   TRIG 324 (H) 04/10/2020 12:00 PM   HDL 39 (L) 04/10/2020 12:00 PM   CHOLHDL 7.0 (H) 04/10/2020 12:00 PM   CHOLHDL 6.3 (H) 01/11/2017 09:09 AM   LDLCALC 173 (H) 04/10/2020 12:00 PM   LDLDIRECT 164.9 11/15/2014 11:48 AM    Physical Exam:    VS:  There were no vitals taken for this visit.    Wt Readings from Last 3 Encounters:  09/09/20 (!) 362 lb (164.2 kg)  08/28/20 (!) 362 lb (164.2 kg)  07/18/20 (!) 364 lb (165.1 kg)     Physical Exam ***  ASSESSMENT & PLAN:    ***    ***1. Chronic heart failure with preserved ejection fraction ([name]) Echocardiogram in July demonstrated normal LV function.  Her body habitus makes it difficult to determine if she is volume overloaded.  Her symptoms suggest continued volume excess.  She has responded well to torsemide.  I have recommended increasing torsemide to 40 mg daily.  Start potassium 20 mg daily.  Obtain BMET today and repeat a week.  I will see her back in 2 weeks.  I will also discuss further with Dr. Burt Knack to determine if we should consider right and left heart catheterization.  2. Precordial pain She had a normal cardiac catheterization 2013 and a low risk Myoview in June 2021.  She continues to have chest discomfort.   As noted, I will discuss further with Dr. Burt Knack.  If her symptoms continue despite improved volume status, I question whether or not we should consider proceeding with right and left heart catheterization.  3. SVT (supraventricular tachycardia) ([place]) She is only had 2 episodes.  She is now on beta-blocker therapy.  Continue current dose of metoprolol succinate.  If she has recurrent episodes of SVT in the future, consider referral to EP.  4. Essential hypertension The patient's blood pressure is controlled on her current regimen.  Continue current therapy.     Dispo:  No follow-ups on file.   Medication Adjustments/Labs and Tests Ordered: Current medicines are reviewed at length with the patient today.  Concerns regarding medicines are outlined above.  Tests Ordered: No orders of the defined types were placed in this encounter.  Medication Changes: No orders of the defined types were placed in this encounter.   Signed, Richardson Dopp, PA-C  09/25/2020 10:36 PM    [facility] [address], [place], [place]  [address] Phone: [phone]; Fax: [phone]

## 2020-09-26 ENCOUNTER — Ambulatory Visit: Payer: Medicaid Other | Admitting: Physician Assistant

## 2020-09-26 DIAGNOSIS — R072 Precordial pain: Secondary | ICD-10-CM

## 2020-09-26 DIAGNOSIS — I10 Essential (primary) hypertension: Secondary | ICD-10-CM

## 2020-09-26 DIAGNOSIS — D6869 Other thrombophilia: Secondary | ICD-10-CM

## 2020-09-26 DIAGNOSIS — I5032 Chronic diastolic (congestive) heart failure: Secondary | ICD-10-CM

## 2020-09-29 DIAGNOSIS — I10 Essential (primary) hypertension: Secondary | ICD-10-CM | POA: Diagnosis not present

## 2020-09-29 DIAGNOSIS — I509 Heart failure, unspecified: Secondary | ICD-10-CM | POA: Diagnosis not present

## 2020-09-30 ENCOUNTER — Other Ambulatory Visit: Payer: Self-pay | Admitting: Obstetrics and Gynecology

## 2020-09-30 DIAGNOSIS — I10 Essential (primary) hypertension: Secondary | ICD-10-CM | POA: Diagnosis not present

## 2020-09-30 DIAGNOSIS — I509 Heart failure, unspecified: Secondary | ICD-10-CM | POA: Diagnosis not present

## 2020-09-30 NOTE — Patient Instructions (Signed)
Hi [name], as a part of your Medicaid benefit, you are eligible for care management and care coordination services at no cost or copay. I was unable to reach you by phone today but would be happy to help you with your health related needs. Please feel free to call me at [phone].    Aida Raider RN, BSN [place]  [facility] - Managed Medicaid High Risk [phone]

## 2020-09-30 NOTE — Patient Outreach (Signed)
Care Coordination  09/30/2020  [name] Aug 22, 1976 [id number]  Third unsuccessful telephone outreach was attempted today. The patient was referred to the case management team for assistance with care management and care coordination. The patient's primary care provider has been notified of our unsuccessful attempts to make or maintain contact with the patient. The care management team is pleased to engage with this patient at any time in the future should he/she be interested in assistance from the care management team.   Follow Up Plan: The patient has been provided with contact information for the Managed Medicaid care management team and has been advised to call with any health related questions or concerns.   Aida Raider RN, BSN [place]  [facility] - Managed Medicaid High Risk [phone]

## 2020-10-01 DIAGNOSIS — I10 Essential (primary) hypertension: Secondary | ICD-10-CM | POA: Diagnosis not present

## 2020-10-01 DIAGNOSIS — I509 Heart failure, unspecified: Secondary | ICD-10-CM | POA: Diagnosis not present

## 2020-10-02 DIAGNOSIS — I10 Essential (primary) hypertension: Secondary | ICD-10-CM | POA: Diagnosis not present

## 2020-10-02 DIAGNOSIS — I509 Heart failure, unspecified: Secondary | ICD-10-CM | POA: Diagnosis not present

## 2020-10-03 DIAGNOSIS — I509 Heart failure, unspecified: Secondary | ICD-10-CM | POA: Diagnosis not present

## 2020-10-03 DIAGNOSIS — I10 Essential (primary) hypertension: Secondary | ICD-10-CM | POA: Diagnosis not present

## 2020-10-04 DIAGNOSIS — I509 Heart failure, unspecified: Secondary | ICD-10-CM | POA: Diagnosis not present

## 2020-10-04 DIAGNOSIS — I10 Essential (primary) hypertension: Secondary | ICD-10-CM | POA: Diagnosis not present

## 2020-10-05 DIAGNOSIS — I10 Essential (primary) hypertension: Secondary | ICD-10-CM | POA: Diagnosis not present

## 2020-10-05 DIAGNOSIS — I509 Heart failure, unspecified: Secondary | ICD-10-CM | POA: Diagnosis not present

## 2020-10-06 ENCOUNTER — Encounter (HOSPITAL_BASED_OUTPATIENT_CLINIC_OR_DEPARTMENT_OTHER): Payer: Medicaid Other | Attending: Internal Medicine | Admitting: Internal Medicine

## 2020-10-06 ENCOUNTER — Other Ambulatory Visit: Payer: Self-pay

## 2020-10-06 DIAGNOSIS — M86172 Other acute osteomyelitis, left ankle and foot: Secondary | ICD-10-CM | POA: Diagnosis not present

## 2020-10-06 DIAGNOSIS — E1151 Type 2 diabetes mellitus with diabetic peripheral angiopathy without gangrene: Secondary | ICD-10-CM | POA: Diagnosis not present

## 2020-10-06 DIAGNOSIS — G4733 Obstructive sleep apnea (adult) (pediatric): Secondary | ICD-10-CM | POA: Diagnosis not present

## 2020-10-06 DIAGNOSIS — L97512 Non-pressure chronic ulcer of other part of right foot with fat layer exposed: Secondary | ICD-10-CM | POA: Diagnosis not present

## 2020-10-06 DIAGNOSIS — E11621 Type 2 diabetes mellitus with foot ulcer: Secondary | ICD-10-CM | POA: Insufficient documentation

## 2020-10-06 DIAGNOSIS — J45909 Unspecified asthma, uncomplicated: Secondary | ICD-10-CM | POA: Diagnosis not present

## 2020-10-06 DIAGNOSIS — E669 Obesity, unspecified: Secondary | ICD-10-CM | POA: Diagnosis not present

## 2020-10-06 NOTE — Progress Notes (Signed)
[name] ([id number]) Visit Report for 10/06/2020 Debridement Details Patient Name: Date of Service: [name] [facility] 10/06/2020 10:00 A M Medical Record Number: [id number] Patient Account Number: [id number] Date of Birth/Sex: Treating RN: 1976/08/21 (44 y.o. [name] Primary Care Provider: [name] Other Clinician: Referring Provider: Treating Provider/Extender: Mammie Russian, Ivan Anchors in Treatment: 2 Debridement Performed for Assessment: Wound #4 Right Amputation Site - Transmetatarsal Performed By: Physician Ricard Dillon., MD Debridement Type: Debridement Severity of Tissue Pre Debridement: Fat layer exposed Level of Consciousness (Pre-procedure): Awake and Alert Pre-procedure Verification/Time Out Yes - 11:10 Taken: Start Time: 11:10 T Area Debrided (L x W): otal 1 (cm) x 1.3 (cm) = 1.3 (cm) Tissue and other material debrided: Viable, Non-Viable, Slough, Subcutaneous, Slough Level: Skin/Subcutaneous Tissue Debridement Description: Excisional Instrument: Curette Bleeding: Minimum Hemostasis Achieved: Pressure End Time: 11:11 Procedural Pain: 5 Post Procedural Pain: 2 Response to Treatment: Procedure was tolerated well Level of Consciousness (Post- Awake and Alert procedure): Post Debridement Measurements of Total Wound Length: (cm) 1 Width: (cm) 1.3 Depth: (cm) 0.3 Volume: (cm) 0.306 Character of Wound/Ulcer Post Debridement: Improved Severity of Tissue Post Debridement: Fat layer exposed Post Procedure Diagnosis Same as Pre-procedure Electronic Signature(s) Signed: 10/06/2020 5:37:06 PM By: Linton Ham MD Signed: 10/06/2020 5:57:05 PM By: Levan Hurst RN, BSN Entered By: Linton Ham on 10/06/2020 11:35:18 -------------------------------------------------------------------------------- HPI Details Patient Name: Date of Service: [name], [name] 10/06/2020 10:00 A M Medical Record Number: [id number] Patient Account  Number: [id number] Date of Birth/Sex: Treating RN: Apr 14, 1976 (44 y.o. [name] Primary Care Provider: [name] Other Clinician: Referring Provider: Treating Provider/Extender: Mammie Russian, Ivan Anchors in Treatment: 2 History of Present Illness HPI Description: 05/30/19 patient presents today for initial evaluation our clinic concerning significant wounds that she has in the right gluteal region as well as in the left abdominal area. She also has a pressure injury to her trans metatarsal amputation site on the right which does not appear to be as looking at this time but nonetheless does need to close. This occurred while she was in the hospital. The patient was admitted to the hospital on 05/17/19 discharged on 05/25/19. This was due to the significant abscess in the right gluteal region as well as in the left panus region. She has a history of diabetes which is definitely not under good control of the most useful view of an agency being 17.5. She also has hypertension, COPD, and is morbidly obese. Unfortunately the wound and the right gluteal region extends right up and adjacent to the anus which is going to make the Wound VAC impossible at this point. Nonetheless I think that we're gonna have to basically pack this with wet to dry packing at this time in order to hopefully achieve improvement. Nonetheless this is a significant wound with her diabetes be not under control along with her size and again the nature of the wound I think this is gonna take a very long time to heal. 07/04/2019 upon evaluation today patient actually appears to be doing better in regard to her wounds. Her foot ulcer actually appears to be completely healed which is great the abdominal ulcer has filled in quite nicely and seems to be doing well although there is a lot of moisture I think that she would benefit from a silver alginate dressing. Nonetheless in regard to the wound on her right  gluteal region this showed signs of improvement today still this is a fairly significant ulcer  unfortunately. 08/01/2019 on evaluation patient presents for follow-up concerning her wounds over the right gluteal region as well as the left abdominal region. Fortunately both areas are showing signs of improvement which is good news. In fact significant improvement. Spent about a month since have seen her last. Overall I am extremely happy though with the findings of how the wounds have progressed at this point. READMISSION 09/22/2020 This is a now 44 year old woman previously in this clinic for a right gluteal abscess for 3 or 4 visits in 2020. She is a poorly controlled type II diabetic with the last hemoglobin A1c I see in epic at 12.7 in July 2021. She tells [place] that she is here for review of a wound on the tip of her right first metatarsal transmetatarsal amputation. She had an angiogram and angioplasty on 12/20/2018 with angioplasty of her anterior tibial and posterior tibial arteries. She has been followed by Dr. Doran Durand most recent treatment since August I think has been Santyl. She was given a 1 week course of Keflex in mid September. The patient's had an x-ray and also an MRI in August 2021 that did not show osteomyelitis but edema ABI in our clinic was 1.19 Past medical history includes hypertension, hyperlipidemia, COPD, bipolar disorder, poorly controlled type 2 diabetes with PAD. Left BKA, right transmetatarsal amputation, heart failure with preserved ejection fraction, she is minimally I am ambulatory walking only from her wheelchair to the bathroom with her walker. She only wears a stocking on her foot. Otherwise she is in wheelchair or in bed all day long. 11/1; patient readmitted to the clinic 2 weeks ago. She has a wound on the tip of the right first metatarsal transmetatarsal amputation site she has been using Santyl. She has Medicaid and we have to apply for assistance if we are going  to continue with Santyl although I am hopeful that we will be able to change from the [facility] next week. Electronic Signature(s) Signed: 10/06/2020 5:37:06 PM By: Linton Ham MD Entered By: Linton Ham on 10/06/2020 11:36:20 -------------------------------------------------------------------------------- Physical Exam Details Patient Name: Date of Service: [name], [name] 10/06/2020 10:00 A M Medical Record Number: [id number] Patient Account Number: [id number] Date of Birth/Sex: Treating RN: [birth year] (44 y.o. [name] Primary Care Provider: [name] Other Clinician: Referring Provider: Treating Provider/Extender: Mammie Russian, Ivan Anchors in Treatment: 2 Constitutional Sitting or standing Blood Pressure is within target range for patient.. Pulse regular and within target range for patient.Marland Kitchen Respirations regular, non-labored and within target range.. Temperature is normal and within the target range for the patient.Marland Kitchen Appears in no distress. Notes Wound exam; first right MTP. Using a #3 curette I debrided this area of necrotic debris on the wound surface I am able to get excellent looking wound surface which I am hopeful will not require further debridement. Hemostasis with direct pressure there is no evidence of infection around the wound. No palpable bone or deep structures Electronic Signature(s) Signed: 10/06/2020 5:37:06 PM By: Linton Ham MD Entered By: Linton Ham on 10/06/2020 11:37:25 -------------------------------------------------------------------------------- Physician Orders Details Patient Name: Date of Service: [name], [name] 10/06/2020 10:00 A M Medical Record Number: [id number] Patient Account Number: [id number] Date of Birth/Sex: Treating RN: 10-12-1976 (44 y.o. [name] Primary Care Provider: [name] Other Clinician: Referring Provider: Treating Provider/Extender: Mammie Russian,  Ivan Anchors in Treatment: 2 Verbal / Phone Orders: No Diagnosis Coding ICD-10 Coding Code Description E11.621 Type 2 diabetes mellitus with foot ulcer L97.512 Non-pressure chronic  ulcer of other part of right foot with fat layer exposed Z89.512 Acquired absence of left leg below knee E11.51 Type 2 diabetes mellitus with diabetic peripheral angiopathy without gangrene Follow-up Appointments Return Appointment in 1 week. Dressing Change Frequency Wound #4 Right Amputation Site - Transmetatarsal Change dressing every day. Wound Cleansing Wound #4 Right Amputation Site - Transmetatarsal May shower and wash wound with soap and water. Primary Wound Dressing Wound #4 Right Amputation Site - Transmetatarsal Santyl Ointment Secondary Dressing Wound #4 Right Amputation Site - Transmetatarsal Kerlix/Rolled Gauze Dry Gauze Electronic Signature(s) Signed: 10/06/2020 5:37:06 PM By: Linton Ham MD Signed: 10/06/2020 5:57:05 PM By: Levan Hurst RN, BSN Entered By: Levan Hurst on 10/06/2020 11:13:12 -------------------------------------------------------------------------------- Problem List Details Patient Name: Date of Service: [name], [name] 10/06/2020 10:00 A M Medical Record Number: [id number] Patient Account Number: [id number] Date of Birth/Sex: Treating RN: 02-13-1976 (44 y.o. [name] Primary Care Provider: [name] Other Clinician: Referring Provider: Treating Provider/Extender: Mammie Russian, Ivan Anchors in Treatment: 2 Active Problems ICD-10 Encounter Code Description Active Date MDM Diagnosis E11.621 Type 2 diabetes mellitus with foot ulcer 09/22/2020 No Yes L97.512 Non-pressure chronic ulcer of other part of right foot with fat layer exposed 09/22/2020 No Yes Z89.512 Acquired absence of left leg below knee 09/22/2020 No Yes E11.51 Type 2 diabetes mellitus with diabetic peripheral angiopathy without gangrene 09/22/2020 No  Yes Inactive Problems Resolved Problems Electronic Signature(s) Signed: 10/06/2020 5:37:06 PM By: Linton Ham MD Entered By: Linton Ham on 10/06/2020 11:34:55 -------------------------------------------------------------------------------- Progress Note Details Patient Name: Date of Service: [name], [name] 10/06/2020 10:00 A M Medical Record Number: [id number] Patient Account Number: [id number] Date of Birth/Sex: Treating RN: 17-Nov-1976 (44 y.o. [name] Primary Care Provider: [name] Other Clinician: Referring Provider: Treating Provider/Extender: Mammie Russian, Ivan Anchors in Treatment: 2 Subjective History of Present Illness (HPI) 05/30/19 patient presents today for initial evaluation our clinic concerning significant wounds that she has in the right gluteal region as well as in the left abdominal area. She also has a pressure injury to her trans metatarsal amputation site on the right which does not appear to be as looking at this time but nonetheless does need to close. This occurred while she was in the hospital. The patient was admitted to the hospital on 05/17/19 discharged on 05/25/19. This was due to the significant abscess in the right gluteal region as well as in the left panus region. She has a history of diabetes which is definitely not under good control of the most useful view of an agency being 17.5. She also has hypertension, COPD, and is morbidly obese. Unfortunately the wound and the right gluteal region extends right up and adjacent to the anus which is going to make the Wound VAC impossible at this point. Nonetheless I think that we're gonna have to basically pack this with wet to dry packing at this time in order to hopefully achieve improvement. Nonetheless this is a significant wound with her diabetes be not under control along with her size and again the nature of the wound I think this is gonna take a very long time to  heal. 07/04/2019 upon evaluation today patient actually appears to be doing better in regard to her wounds. Her foot ulcer actually appears to be completely healed which is great the abdominal ulcer has filled in quite nicely and seems to be doing well although there is a lot of moisture I think that she would benefit from a  silver alginate dressing. Nonetheless in regard to the wound on her right gluteal region this showed signs of improvement today still this is a fairly significant ulcer unfortunately. 08/01/2019 on evaluation patient presents for follow-up concerning her wounds over the right gluteal region as well as the left abdominal region. Fortunately both areas are showing signs of improvement which is good news. In fact significant improvement. Spent about a month since have seen her last. Overall I am extremely happy though with the findings of how the wounds have progressed at this point. READMISSION 09/22/2020 This is a now 44 year old woman previously in this clinic for a right gluteal abscess for 3 or 4 visits in 2020. She is a poorly controlled type II diabetic with the last hemoglobin A1c I see in epic at 12.7 in July 2021. She tells [place] that she is here for review of a wound on the tip of her right first metatarsal transmetatarsal amputation. She had an angiogram and angioplasty on 12/20/2018 with angioplasty of her anterior tibial and posterior tibial arteries. She has been followed by Dr. Doran Durand most recent treatment since August I think has been Santyl. She was given a 1 week course of Keflex in mid September. The patient's had an x-ray and also an MRI in August 2021 that did not show osteomyelitis but edema ABI in our clinic was 1.19 Past medical history includes hypertension, hyperlipidemia, COPD, bipolar disorder, poorly controlled type 2 diabetes with PAD. Left BKA, right transmetatarsal amputation, heart failure with preserved ejection fraction, she is minimally I am  ambulatory walking only from her wheelchair to the bathroom with her walker. She only wears a stocking on her foot. Otherwise she is in wheelchair or in bed all day long. 11/1; patient readmitted to the clinic 2 weeks ago. She has a wound on the tip of the right first metatarsal transmetatarsal amputation site she has been using Santyl. She has Medicaid and we have to apply for assistance if we are going to continue with Santyl although I am hopeful that we will be able to change from the [facility] next week. Objective Constitutional Sitting or standing Blood Pressure is within target range for patient.. Pulse regular and within target range for patient.Marland Kitchen Respirations regular, non-labored and within target range.. Temperature is normal and within the target range for the patient.Marland Kitchen Appears in no distress. Vitals Time Taken: 10:07 AM, Height: 62 in, Source: Stated, Weight: 362 lbs, Source: Stated, BMI: 66.2, Temperature: 98.2 F, Pulse: 100 bpm, Respiratory Rate: 20 breaths/min, Blood Pressure: 112/72 mmHg. General Notes: Wound exam; first right MTP. Using a #3 curette I debrided this area of necrotic debris on the wound surface I am able to get excellent looking wound surface which I am hopeful will not require further debridement. Hemostasis with direct pressure there is no evidence of infection around the wound. No palpable bone or deep structures Integumentary (Hair, Skin) Wound #4 status is Open. Original cause of wound was Gradually Appeared. 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 The wound measures 1cm length x 1.3cm width x 0.3cm depth; 1.021cm^2 area and 0.306cm^3 volume. There is Fat Layer (Subcutaneous Tissue) exposed. There is no tunneling or undermining noted. There is a medium amount of serous drainage noted. The wound margin is distinct with the outline attached to the wound base. There is no granulation within the wound bed. There is a large (67-100%)  amount of necrotic tissue within the wound bed including Adherent Slough. 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 There was a Excisional Skin/Subcutaneous Tissue Debridement with a total area of 1.3 sq cm performed by Ricard Dillon., MD. With the following instrument(s): Curette to remove Viable and Non-Viable tissue/material. Material removed includes Subcutaneous Tissue and Slough and. No specimens were taken. A time out was conducted at 11:10, prior to the start of the procedure. A Minimum amount of bleeding was controlled with Pressure. The procedure was tolerated well with a pain level of 5 throughout and a pain level of 2 following the procedure. Post Debridement Measurements: 1cm length x 1.3cm width x 0.3cm depth; 0.306cm^3 volume. Character of Wound/Ulcer Post Debridement is improved. Severity of Tissue Post Debridement is: Fat layer exposed. Post procedure Diagnosis Wound #4: Same as Pre-Procedure Plan Follow-up Appointments: Return Appointment in 1 week. Dressing Change Frequency: Wound #4 Right Amputation Site - Transmetatarsal: Change dressing every day. Wound Cleansing: Wound #4 Right Amputation Site - Transmetatarsal: May shower and wash wound with soap and water. Primary Wound Dressing: Wound #4 Right Amputation Site - Transmetatarsal: Santyl Ointment Secondary Dressing: Wound #4 Right Amputation Site - Transmetatarsal: Kerlix/Rolled Gauze Dry Gauze 1. I am continuing with Santyl ointment to the right first amputation  site 2. I am hopeful that we will not need Santyl beyond another week if we can have a cleaner looking wound surface after debridement this week which was already better than last, may be able to move to a collagen based dressing next week. Electronic Signature(s) Signed: 10/06/2020 5:37:06 PM By: Linton Ham MD Entered By: Linton Ham on 10/06/2020 11:38:08 -------------------------------------------------------------------------------- SuperBill Details Patient Name: Date of Service: [name], Belinda [facility] 10/06/2020 Medical Record Number: [id number] Patient Account Number: [id number] Date of Birth/Sex: Treating RN: [birth year] (44 y.o. [name] Primary Care Provider: [name] Other Clinician: Referring Provider: Treating Provider/Extender: Mammie Russian, Ivan Anchors in Treatment: 2 Diagnosis Coding ICD-10 Codes Code Description [phone] Type 2 diabetes mellitus with foot ulcer L97.512 Non-pressure chronic ulcer of other part of right foot with fat layer exposed Z89.512 Acquired absence of left leg below knee E11.51 Type 2 diabetes mellitus with diabetic peripheral angiopathy without gangrene Facility Procedures CPT4 Code: [id number] Description: 44034 - DEB SUBQ TISSUE 20 SQ CM/< ICD-10 Diagnosis Description L97.512 Non-pressure chronic ulcer of other part of right foot with fat layer exposed E11.621 Type 2 diabetes mellitus with foot ulcer Modifier: Quantity: 1 Physician Procedures : CPT4 Code Description Modifier [id number] - WC PHYS SUBQ TISS 20 SQ CM ICD-10 Diagnosis Description L97.512 Non-pressure chronic ulcer of other part of right foot with fat layer exposed E11.621 Type 2 diabetes mellitus with foot ulcer Quantity: 1 Electronic Signature(s) Signed: 10/06/2020 5:37:06 PM By: Linton Ham MD Entered By: Linton Ham on 10/06/2020 11:38:21

## 2020-10-06 NOTE — Progress Notes (Signed)
[name], [name] ([id number]) Visit Report for 10/06/2020 Arrival Information Details Patient Name: Date of Service: [name] 10/06/2020 10:00 A M Medical Record Number: [id number] Patient Account Number: [id number] Date of Birth/Sex: Treating RN: 1976-10-13 (44 y.o. [name] Primary Care Saturnino Liew: [name] Other Clinician: Referring Bernd Crom: Treating Evyn Putzier/Extender: Mammie Russian, Ivan Anchors in Treatment: 2 Visit Information History Since Last Visit Added or deleted any medications: No Patient Arrived: Wheel Chair Any new allergies or adverse reactions: No Arrival Time: 09:56 Had a fall or experienced change in No Accompanied By: friend activities of daily living that may affect Transfer Assistance: None risk of falls: Patient Identification Verified: Yes Signs or symptoms of abuse/neglect since last visito No Secondary Verification Process Completed: Yes Hospitalized since last visit: No Patient Requires Transmission-Based Precautions: No Implantable device outside of the clinic excluding No Patient Has Alerts: No cellular tissue based products placed in the center since last visit: Has Dressing in Place as Prescribed: Yes Pain Present Now: Yes Electronic Signature(s) Signed: 10/06/2020 5:46:37 PM By: Baruch Gouty RN, BSN Entered By: Baruch Gouty on 10/06/2020 10:07:20 -------------------------------------------------------------------------------- Lower Extremity Assessment Details Patient Name: Date of Service: [name], TA [facility] 10/06/2020 10:00 A M Medical Record Number: [id number] Patient Account Number: [id number] Date of Birth/Sex: Treating RN: [birth year] (44 y.o. [name] Primary Care [name]: [name] Other Clinician: Referring Paelyn Smick: Treating Tikesha Mort/Extender: Mammie Russian, Ivan Anchors in Treatment: 2 Edema Assessment Assessed: [Left: No] [Right: No] Edema: [Left: VO] [Right:  s] Vascular Assessment Pulses: Dorsalis Pedis Palpable: [Right:Yes] Electronic Signature(s) Signed: 10/06/2020 5:46:37 PM By: Baruch Gouty RN, BSN Entered By: Baruch Gouty on 10/06/2020 10:09:42 -------------------------------------------------------------------------------- Multi Wound Chart Details Patient Name: Date of Service: [name], [name] 10/06/2020 10:00 A M Medical Record Number: [id number] Patient Account Number: [id number] Date of Birth/Sex: Treating RN: September 14, 1976 (44 y.o. [name] Primary Care [name]: [name] Other Clinician: Referring Aunna Snooks: Treating Kynsleigh Westendorf/Extender: Mammie Russian, Ivan Anchors in Treatment: 2 Vital Signs Height(in): 53 Pulse(bpm): 100 Weight(lbs): 299 Blood Pressure(mmHg): 112/72 Body Mass Index(BMI): 66 Temperature(F): 98.2 Respiratory Rate(breaths/min): 20 Photos: [4:No Photos Right Amputation Site -] [N/A:N/A N/A] Wound Location: [4:Transmetatarsal Gradually Appeared] [N/A:N/A] Wounding Event: [4:Diabetic Wound/Ulcer of the Lower] [N/A:N/A] Primary Etiology: [4:Extremity Asthma, Chronic Obstructive] [N/A:N/A] Comorbid History: [4:Pulmonary Disease (COPD), Sleep Apnea, Congestive Heart Failure, Hypertension, Peripheral Venous Disease, Type II Diabetes, Osteoarthritis, Osteomyelitis, Neuropathy 03/06/2020] [N/A:N/A] Date Acquired: [4:2] [N/A:N/A] Weeks of Treatment: [4:Open] [N/A:N/A] Wound Status: [4:1x1.3x0.3] [N/A:N/A] Measurements L x W x D (cm) [4:1.021] [N/A:N/A] A (cm) : rea [4:0.306] [N/A:N/A] Volume (cm) : [4:-18.20%] [N/A:N/A] % Reduction in A [4:rea: -18.10%] [N/A:N/A] % Reduction in Volume: [4:Grade 1] [N/A:N/A] Classification: [4:Medium] [N/A:N/A] Exudate A mount: [4:Serous] [N/A:N/A] Exudate Type: [4:amber] [N/A:N/A] Exudate Color: [4:Distinct, outline attached] [N/A:N/A] Wound Margin: [4:None Present (0%)] [N/A:N/A] Granulation A mount: [4:Large (67-100%)] [N/A:N/A] Necrotic A  mount: [4:Fat Layer (Subcutaneous Tissue): Yes N/A] Exposed Structures: [4:Fascia: No Tendon: No Muscle: No Joint: No Bone: No None] [N/A:N/A] Epithelialization: [4:Debridement - Excisional] [N/A:N/A] Debridement: Pre-procedure Verification/Time Out 11:10 [N/A:N/A] Taken: [4:Subcutaneous, Slough] [N/A:N/A] Tissue Debrided: [4:Skin/Subcutaneous Tissue] [N/A:N/A] Level: [4:1.3] [N/A:N/A] Debridement A (sq cm): [4:rea Curette] [N/A:N/A] Instrument: [4:Minimum] [N/A:N/A] Bleeding: [4:Pressure] [N/A:N/A] Hemostasis A chieved: [4:5] [N/A:N/A] Procedural Pain: [4:2] [N/A:N/A] Post Procedural Pain: [4:Procedure was tolerated well] [N/A:N/A] Debridement Treatment Response: [4:1x1.3x0.3] [N/A:N/A] Post Debridement Measurements L x W x D (cm) [4:0.306] [N/A:N/A] Post Debridement Volume: (cm) [4:Debridement] [N/A:N/A] Treatment Notes Electronic Signature(s) Signed: 10/06/2020 5:37:06 PM By: Linton Ham  MD Signed: 10/06/2020 5:57:05 PM By: Levan Hurst RN, BSN Entered By: Linton Ham on 10/06/2020 11:35:06 -------------------------------------------------------------------------------- Multi-Disciplinary Care Plan Details Patient Name: Date of Service: [name], [name] [facility] 10/06/2020 10:00 A M Medical Record Number: [id number] Patient Account Number: [id number] Date of Birth/Sex: Treating RN: Feb 10, 1976 (44 y.o. [name] Primary Care [name]: [name] Other Clinician: Referring Bailei Buist: Treating Jacere Pangborn/Extender: Mammie Russian, Ivan Anchors in Treatment: 2 Active Inactive Abuse / Safety / Falls / Self Care Management Nursing Diagnoses: History of Falls Potential for injury related to falls Goals: Patient will not experience any injury related to falls Date Initiated: 09/22/2020 Target Resolution Date: 10/24/2020 Goal Status: Active Patient/caregiver will verbalize/demonstrate measures taken to prevent injury and/or falls Date Initiated:  09/22/2020 Target Resolution Date: 10/24/2020 Goal Status: Active Interventions: Assess Activities of Daily Living upon admission and as needed Assess fall risk on admission and as needed Assess: immobility, friction, shearing, incontinence upon admission and as needed Assess impairment of mobility on admission and as needed per policy Assess personal safety and home safety (as indicated) on admission and as needed Assess self care needs on admission and as needed Provide education on fall prevention Notes: Nutrition Nursing Diagnoses: Impaired glucose control: actual or potential Potential for alteratiion in Nutrition/Potential for imbalanced nutrition Goals: Patient/caregiver agrees to and verbalizes understanding of need to use nutritional supplements and/or vitamins as prescribed Date Initiated: 09/22/2020 Target Resolution Date: 10/24/2020 Goal Status: Active Patient/caregiver will maintain therapeutic glucose control Date Initiated: 09/22/2020 Target Resolution Date: 10/24/2020 Goal Status: Active Interventions: Assess HgA1c results as ordered upon admission and as needed Assess patient nutrition upon admission and as needed per policy Provide education on elevated blood sugars and impact on wound healing Provide education on nutrition Treatment Activities: Education provided on Nutrition : 09/22/2020 Notes: Wound/Skin Impairment Nursing Diagnoses: Impaired tissue integrity Knowledge deficit related to ulceration/compromised skin integrity Goals: Patient/caregiver will verbalize understanding of skin care regimen Date Initiated: 09/22/2020 Target Resolution Date: 10/24/2020 Goal Status: Active Ulcer/skin breakdown will have a volume reduction of 30% by week 4 Date Initiated: 09/22/2020 Target Resolution Date: 10/24/2020 Goal Status: Active Interventions: Assess patient/caregiver ability to obtain necessary supplies Assess patient/caregiver ability to perform  ulcer/skin care regimen upon admission and as needed Assess ulceration(s) every visit Provide education on ulcer and skin care Notes: Electronic Signature(s) Signed: 10/06/2020 5:57:05 PM By: Levan Hurst RN, BSN Entered By: Levan Hurst on 10/06/2020 12:55:30 -------------------------------------------------------------------------------- Pain Assessment Details Patient Name: Date of Service: [name], [name] 10/06/2020 10:00 A M Medical Record Number: [id number] Patient Account Number: [id number] Date of Birth/Sex: Treating RN: October 17, 1976 (44 y.o. [name] Primary Care Arin Peral: [name] Other Clinician: Referring Brittanee Ghazarian: Treating Cresta Riden/Extender: Mammie Russian, Ivan Anchors in Treatment: 2 Active Problems Location of Pain Severity and Description of Pain Patient Has Paino Yes Site Locations Pain Location: Generalized Pain With Dressing Change: No Duration of the Pain. Constant / Intermittento Intermittent Rate the pain. Current Pain Level: 7 Character of Pain Describe the Pain: Aching Pain Management and Medication Current Pain Management: Medication: Yes How does your wound impact your activities of daily livingo Sleep: Yes Bathing: No Appetite: No Relationship With Others: No Bladder Continence: No Emotions: No Bowel Continence: No Work: No Toileting: No Drive: No Dressing: No Hobbies: No Notes reports aching in right leg Electronic Signature(s) Signed: 10/06/2020 5:46:37 PM By: Baruch Gouty RN, BSN Entered By: Baruch Gouty on 10/06/2020 10:09:08 -------------------------------------------------------------------------------- Patient/Caregiver Education Details Patient Name: Date of Service: [name], TA  [facility] 11/1/2021andnbsp10:00 A M Medical Record Number: [id number] Patient Account Number: [id number] Date of Birth/Gender: Treating RN: February 26, 1976 (44 y.o. [name] Primary Care Physician: [name]  Other Clinician: Referring Physician: Treating Physician/Extender: Mammie Russian, Ivan Anchors in Treatment: 2 Education Assessment Education Provided To: Patient Education Topics Provided Nutrition: Methods: Explain/Verbal Responses: State content correctly Safety: Methods: Explain/Verbal Responses: State content correctly Wound/Skin Impairment: Methods: Explain/Verbal Responses: State content correctly Electronic Signature(s) Signed: 10/06/2020 5:57:05 PM By: Levan Hurst RN, BSN Entered By: Levan Hurst on 10/06/2020 12:55:57 -------------------------------------------------------------------------------- Wound Assessment Details Patient Name: Date of Service: [name], [name] 10/06/2020 10:00 A M Medical Record Number: [id number] Patient Account Number: [id number] Date of Birth/Sex: Treating RN: Jul 30, 1976 (44 y.o. [name] Primary Care Merrissa Giacobbe: [name] Other Clinician: Referring Ahriana Gunkel: Treating Female Minish/Extender: Mammie Russian, Ivan Anchors in Treatment: 2 Wound Status Wound Number: 4 Primary Diabetic Wound/Ulcer of the Lower Extremity Etiology: Wound Location: Right Amputation Site - Transmetatarsal Wound Open Wounding Event: Gradually Appeared Status: Date Acquired: 03/06/2020 Comorbid Asthma, Chronic Obstructive Pulmonary Disease (COPD), Sleep Weeks Of Treatment: 2 History: Apnea, Congestive Heart Failure, Hypertension, Peripheral Venous Clustered Wound: No Disease, Type II Diabetes, Osteoarthritis, Osteomyelitis, Neuropathy Wound Measurements Length: (cm) 1 Width: (cm) 1.3 Depth: (cm) 0.3 Area: (cm) 1.021 Volume: (cm) 0.306 % Reduction in Area: -18.2% % Reduction in Volume: -18.1% Epithelialization: None Tunneling: No Undermining: No Wound Description Classification: Grade 1 Wound Margin: Distinct, outline attached Exudate Amount: Medium Exudate Type: Serous Exudate Color: amber Foul Odor After  Cleansing: No Slough/Fibrino No Wound Bed Granulation Amount: None Present (0%) Exposed Structure Necrotic Amount: Large (67-100%) Fascia Exposed: No Necrotic Quality: Adherent Slough Fat Layer (Subcutaneous Tissue) Exposed: Yes Tendon Exposed: No Muscle Exposed: No Joint Exposed: No Bone Exposed: No Electronic Signature(s) Signed: 10/06/2020 5:46:37 PM By: Baruch Gouty RN, BSN Entered By: Baruch Gouty on 10/06/2020 10:10:23 -------------------------------------------------------------------------------- [place] Details Patient Name: Date of Service: [name], [name] 10/06/2020 10:00 A M Medical Record Number: [id number] Patient Account Number: [id number] Date of Birth/Sex: Treating RN: 10/25/1976 (44 y.o. Martyn Malay, Linda Primary Care Nakkia Mackiewicz: [name] Other Clinician: Referring Alika Saladin: Treating Tasmin Exantus/Extender: Mammie Russian, Ivan Anchors in Treatment: 2 Vital Signs Time Taken: 10:07 Temperature (F): 98.2 Height (in): 62 Pulse (bpm): 100 Source: Stated Respiratory Rate (breaths/min): 20 Weight (lbs): 362 Blood Pressure (mmHg): 112/72 Source: Stated Reference Range: 80 - 120 mg / dl Body Mass Index (BMI): 66.2 Electronic Signature(s) Signed: 10/06/2020 5:46:37 PM By: Baruch Gouty RN, BSN Entered By: Baruch Gouty on 10/06/2020 01:60:10

## 2020-10-07 ENCOUNTER — Other Ambulatory Visit: Payer: Self-pay | Admitting: *Deleted

## 2020-10-07 DIAGNOSIS — S31109A Unspecified open wound of abdominal wall, unspecified quadrant without penetration into peritoneal cavity, initial encounter: Secondary | ICD-10-CM | POA: Diagnosis not present

## 2020-10-07 NOTE — Telephone Encounter (Signed)
Got a fax from pt pharmacy saying that medicaid no longer covers any topical treatment (ex. terconazole). Needs a rx for diflucan instead? Janson Lamar Kennon Holter, CMA

## 2020-10-08 NOTE — Telephone Encounter (Signed)
I haven't prescribed this for her recently, last time was 7 months ago. Is this something she currently needs? [name], MD

## 2020-10-08 NOTE — Telephone Encounter (Signed)
Attempted to call pt no answer, will try again later. Yuliza Cara, CMA  

## 2020-10-09 ENCOUNTER — Other Ambulatory Visit: Payer: Self-pay

## 2020-10-09 ENCOUNTER — Observation Stay (HOSPITAL_COMMUNITY)
Admission: EM | Admit: 2020-10-09 | Discharge: 2020-10-10 | Disposition: A | Discharge status: Home / Self Care | Payer: Medicaid Other | Attending: Family Medicine | Admitting: Family Medicine

## 2020-10-09 ENCOUNTER — Encounter (HOSPITAL_COMMUNITY): Payer: Self-pay | Admitting: Family Medicine

## 2020-10-09 ENCOUNTER — Emergency Department (HOSPITAL_COMMUNITY): Payer: Medicaid Other

## 2020-10-09 DIAGNOSIS — G4733 Obstructive sleep apnea (adult) (pediatric): Secondary | ICD-10-CM | POA: Diagnosis not present

## 2020-10-09 DIAGNOSIS — Z20822 Contact with and (suspected) exposure to covid-19: Secondary | ICD-10-CM | POA: Diagnosis not present

## 2020-10-09 DIAGNOSIS — R7989 Other specified abnormal findings of blood chemistry: Secondary | ICD-10-CM | POA: Diagnosis not present

## 2020-10-09 DIAGNOSIS — J45909 Unspecified asthma, uncomplicated: Secondary | ICD-10-CM | POA: Insufficient documentation

## 2020-10-09 DIAGNOSIS — R739 Hyperglycemia, unspecified: Secondary | ICD-10-CM

## 2020-10-09 DIAGNOSIS — R079 Chest pain, unspecified: Secondary | ICD-10-CM

## 2020-10-09 DIAGNOSIS — R778 Other specified abnormalities of plasma proteins: Secondary | ICD-10-CM | POA: Insufficient documentation

## 2020-10-09 DIAGNOSIS — Z794 Long term (current) use of insulin: Secondary | ICD-10-CM | POA: Insufficient documentation

## 2020-10-09 DIAGNOSIS — Z9989 Dependence on other enabling machines and devices: Secondary | ICD-10-CM | POA: Diagnosis not present

## 2020-10-09 DIAGNOSIS — R0602 Shortness of breath: Secondary | ICD-10-CM | POA: Diagnosis not present

## 2020-10-09 DIAGNOSIS — R918 Other nonspecific abnormal finding of lung field: Secondary | ICD-10-CM | POA: Diagnosis not present

## 2020-10-09 DIAGNOSIS — Z6841 Body Mass Index (BMI) 40.0 and over, adult: Secondary | ICD-10-CM | POA: Diagnosis not present

## 2020-10-09 DIAGNOSIS — Z87891 Personal history of nicotine dependence: Secondary | ICD-10-CM | POA: Diagnosis not present

## 2020-10-09 DIAGNOSIS — I209 Angina pectoris, unspecified: Secondary | ICD-10-CM

## 2020-10-09 DIAGNOSIS — E1165 Type 2 diabetes mellitus with hyperglycemia: Secondary | ICD-10-CM | POA: Diagnosis not present

## 2020-10-09 DIAGNOSIS — R110 Nausea: Secondary | ICD-10-CM

## 2020-10-09 DIAGNOSIS — Z79899 Other long term (current) drug therapy: Secondary | ICD-10-CM | POA: Insufficient documentation

## 2020-10-09 DIAGNOSIS — I10 Essential (primary) hypertension: Secondary | ICD-10-CM | POA: Insufficient documentation

## 2020-10-09 DIAGNOSIS — J449 Chronic obstructive pulmonary disease, unspecified: Secondary | ICD-10-CM | POA: Diagnosis not present

## 2020-10-09 DIAGNOSIS — J90 Pleural effusion, not elsewhere classified: Secondary | ICD-10-CM | POA: Diagnosis not present

## 2020-10-09 DIAGNOSIS — R0789 Other chest pain: Secondary | ICD-10-CM | POA: Diagnosis not present

## 2020-10-09 DIAGNOSIS — J9811 Atelectasis: Secondary | ICD-10-CM | POA: Diagnosis not present

## 2020-10-09 LAB — CBG MONITORING, ED
Glucose-Capillary: 520 mg/dL (ref 70–99)
Glucose-Capillary: 551 mg/dL (ref 70–99)
Glucose-Capillary: 528 mg/dL (ref 70–99)
Glucose-Capillary: 505 mg/dL (ref 70–99)
Glucose-Capillary: 368 mg/dL — ABNORMAL HIGH (ref 70–99)

## 2020-10-09 LAB — COMPREHENSIVE METABOLIC PANEL
ALT: 21 U/L (ref 0–44)
AST: 16 U/L (ref 15–41)
Albumin: 2.5 g/dL — ABNORMAL LOW (ref 3.5–5.0)
Alkaline Phosphatase: 121 U/L (ref 38–126)
Anion gap: 13 (ref 5–15)
BUN: 26 mg/dL — ABNORMAL HIGH (ref 6–20)
CO2: 25 mmol/L (ref 22–32)
Calcium: 8.6 mg/dL — ABNORMAL LOW (ref 8.9–10.3)
Chloride: 91 mmol/L — ABNORMAL LOW (ref 98–111)
Creatinine, Ser: 1.2 mg/dL — ABNORMAL HIGH (ref 0.44–1.00)
GFR, Estimated: 58 mL/min — ABNORMAL LOW (ref >60)
Glucose, Bld: 524 mg/dL (ref 70–99)
Potassium: 4.8 mmol/L (ref 3.5–5.1)
Sodium: 129 mmol/L — ABNORMAL LOW (ref 135–145)
Total Bilirubin: 0.4 mg/dL (ref 0.3–1.2)
Total Protein: 6.5 g/dL (ref 6.5–8.1)

## 2020-10-09 LAB — BASIC METABOLIC PANEL
Anion gap: 11 (ref 5–15)
BUN: 30 mg/dL — ABNORMAL HIGH (ref 6–20)
CO2: 25 mmol/L (ref 22–32)
Calcium: 8.4 mg/dL — ABNORMAL LOW (ref 8.9–10.3)
Chloride: 97 mmol/L — ABNORMAL LOW (ref 98–111)
Creatinine, Ser: 1.13 mg/dL — ABNORMAL HIGH (ref 0.44–1.00)
GFR, Estimated: >60 mL/min (ref >60)
Glucose, Bld: 423 mg/dL — ABNORMAL HIGH (ref 70–99)
Potassium: 4.7 mmol/L (ref 3.5–5.1)
Sodium: 133 mmol/L — ABNORMAL LOW (ref 135–145)

## 2020-10-09 LAB — TROPONIN I (HIGH SENSITIVITY)
Troponin I (High Sensitivity): 20 ng/L — ABNORMAL HIGH (ref <18)
Troponin I (High Sensitivity): 17 ng/L (ref <18)

## 2020-10-09 LAB — CBC
HCT: 38 % (ref 36.0–46.0)
Hemoglobin: 12 g/dL (ref 12.0–15.0)
MCH: 29.3 pg (ref 26.0–34.0)
MCHC: 31.6 g/dL (ref 30.0–36.0)
MCV: 92.9 fL (ref 80.0–100.0)
Platelets: 356 10*3/uL (ref 150–400)
RBC: 4.09 MIL/uL (ref 3.87–5.11)
RDW: 13.8 % (ref 11.5–15.5)
WBC: 11.6 10*3/uL — ABNORMAL HIGH (ref 4.0–10.5)
nRBC: 0 % (ref 0.0–0.2)

## 2020-10-09 LAB — I-STAT BETA HCG BLOOD, ED (MC, WL, AP ONLY): I-stat hCG, quantitative: <5 m[IU]/mL (ref <5)

## 2020-10-09 LAB — RESPIRATORY PANEL BY RT PCR (FLU A&B, COVID)
Influenza A by PCR: NEGATIVE
Influenza B by PCR: NEGATIVE
SARS Coronavirus 2 by RT PCR: NEGATIVE

## 2020-10-09 LAB — BRAIN NATRIURETIC PEPTIDE: B Natriuretic Peptide: 41.5 pg/mL (ref 0.0–100.0)

## 2020-10-09 LAB — LIPASE, BLOOD: Lipase: 49 U/L (ref 11–51)

## 2020-10-09 LAB — D-DIMER, QUANTITATIVE: D-Dimer, Quant: 0.49 ug/mL-FEU (ref 0.00–0.50)

## 2020-10-09 LAB — GLUCOSE, CAPILLARY
Glucose-Capillary: 391 mg/dL — ABNORMAL HIGH (ref 70–99)
Glucose-Capillary: 440 mg/dL — ABNORMAL HIGH (ref 70–99)
Glucose-Capillary: 493 mg/dL — ABNORMAL HIGH (ref 70–99)

## 2020-10-09 MED ORDER — GABAPENTIN 600 MG PO TABS
1200.0000 mg | ORAL_TABLET | Freq: Three times a day (TID) | ORAL | Status: DC
  Administered 2020-10-09 – 2020-10-10 (×3): 1200 mg via ORAL
  Filled 2020-10-09 (×5): qty 2

## 2020-10-09 MED ORDER — ONDANSETRON HCL 4 MG/2ML IJ SOLN
4.0000 mg | Freq: Once | INTRAMUSCULAR | Status: AC
  Administered 2020-10-09: 4 mg via INTRAVENOUS
  Filled 2020-10-09: qty 2

## 2020-10-09 MED ORDER — INSULIN ASPART 100 UNIT/ML ~~LOC~~ SOLN
0.0000 [IU] | Freq: Three times a day (TID) | SUBCUTANEOUS | Status: DC
  Administered 2020-10-09 (×2): 15 [IU] via SUBCUTANEOUS

## 2020-10-09 MED ORDER — ROPINIROLE HCL 1 MG PO TABS
1.0000 mg | ORAL_TABLET | Freq: Every day | ORAL | Status: DC
  Administered 2020-10-09: 1 mg via ORAL
  Filled 2020-10-09 (×2): qty 1

## 2020-10-09 MED ORDER — INSULIN ASPART 100 UNIT/ML ~~LOC~~ SOLN: 0.0000 [IU] | Freq: Three times a day (TID) | SUBCUTANEOUS | Status: DC

## 2020-10-09 MED ORDER — ALUM & MAG HYDROXIDE-SIMETH 200-200-20 MG/5ML PO SUSP
30.0000 mL | Freq: Once | ORAL | Status: AC
  Administered 2020-10-09: 30 mL via ORAL
  Filled 2020-10-09 (×2): qty 30

## 2020-10-09 MED ORDER — FENTANYL CITRATE (PF) 100 MCG/2ML IJ SOLN
50.0000 ug | INTRAMUSCULAR | Status: DC | PRN
  Administered 2020-10-09: 50 ug via INTRAVENOUS

## 2020-10-09 MED ORDER — IPRATROPIUM-ALBUTEROL 0.5-2.5 (3) MG/3ML IN SOLN: 3.0000 mL | RESPIRATORY_TRACT | Status: DC | PRN

## 2020-10-09 MED ORDER — ATORVASTATIN CALCIUM 40 MG PO TABS
40.0000 mg | ORAL_TABLET | Freq: Every day | ORAL | Status: DC
  Administered 2020-10-09 – 2020-10-10 (×2): 40 mg via ORAL
  Filled 2020-10-09 (×2): qty 1

## 2020-10-09 MED ORDER — INSULIN ASPART 100 UNIT/ML ~~LOC~~ SOLN
0.0000 [IU] | Freq: Three times a day (TID) | SUBCUTANEOUS | Status: DC
  Administered 2020-10-09: 15 [IU] via SUBCUTANEOUS

## 2020-10-09 MED ORDER — ACETAMINOPHEN 325 MG PO TABS: 650.0000 mg | ORAL_TABLET | ORAL | Status: DC | PRN

## 2020-10-09 MED ORDER — PANTOPRAZOLE SODIUM 40 MG PO TBEC
40.0000 mg | DELAYED_RELEASE_TABLET | Freq: Every day | ORAL | Status: DC
  Administered 2020-10-09 – 2020-10-10 (×2): 40 mg via ORAL
  Filled 2020-10-09 (×2): qty 1

## 2020-10-09 MED ORDER — LIDOCAINE VISCOUS HCL 2 % MT SOLN
15.0000 mL | Freq: Once | OROMUCOSAL | Status: AC
  Administered 2020-10-09: 15 mL via ORAL
  Filled 2020-10-09 (×2): qty 15

## 2020-10-09 MED ORDER — INSULIN ASPART 100 UNIT/ML ~~LOC~~ SOLN
8.0000 [IU] | Freq: Once | SUBCUTANEOUS | Status: AC
  Administered 2020-10-09: 8 [IU] via INTRAVENOUS

## 2020-10-09 MED ORDER — ENOXAPARIN SODIUM 80 MG/0.8ML ~~LOC~~ SOLN
80.0000 mg | SUBCUTANEOUS | Status: DC
  Administered 2020-10-09 – 2020-10-10 (×2): 80 mg via SUBCUTANEOUS
  Filled 2020-10-09 (×2): qty 0.8

## 2020-10-09 MED ORDER — GABAPENTIN 600 MG PO TABS
600.0000 mg | ORAL_TABLET | Freq: Three times a day (TID) | ORAL | Status: DC
  Filled 2020-10-09: qty 1

## 2020-10-09 MED ORDER — HYDROCODONE-ACETAMINOPHEN 10-325 MG PO TABS
1.0000 | ORAL_TABLET | Freq: Three times a day (TID) | ORAL | Status: DC | PRN
  Administered 2020-10-09 – 2020-10-10 (×2): 1 via ORAL
  Filled 2020-10-09 (×2): qty 1

## 2020-10-09 MED ORDER — DIPHENHYDRAMINE HCL 25 MG PO CAPS
25.0000 mg | ORAL_CAPSULE | Freq: Once | ORAL | Status: AC
  Administered 2020-10-09: 25 mg via ORAL
  Filled 2020-10-09: qty 1

## 2020-10-09 MED ORDER — INSULIN ASPART 100 UNIT/ML ~~LOC~~ SOLN
20.0000 [IU] | Freq: Once | SUBCUTANEOUS | Status: AC
  Administered 2020-10-09: 20 [IU] via SUBCUTANEOUS

## 2020-10-09 MED ORDER — MOMETASONE FURO-FORMOTEROL FUM 200-5 MCG/ACT IN AERO
2.0000 | INHALATION_SPRAY | Freq: Two times a day (BID) | RESPIRATORY_TRACT | Status: DC
  Administered 2020-10-10: 2 via RESPIRATORY_TRACT
  Filled 2020-10-09 (×2): qty 8.8

## 2020-10-09 MED ORDER — METOCLOPRAMIDE HCL 5 MG/ML IJ SOLN
10.0000 mg | Freq: Once | INTRAMUSCULAR | Status: AC
  Administered 2020-10-09: 10 mg via INTRAVENOUS
  Filled 2020-10-09: qty 2

## 2020-10-09 MED ORDER — INSULIN ASPART 100 UNIT/ML ~~LOC~~ SOLN
0.0000 [IU] | SUBCUTANEOUS | Status: DC
  Administered 2020-10-10: 15 [IU] via SUBCUTANEOUS
  Administered 2020-10-10 (×2): 11 [IU] via SUBCUTANEOUS

## 2020-10-09 MED ORDER — HYDROCODONE-ACETAMINOPHEN 10-325 MG PO TABS: 1.0000 | ORAL_TABLET | Freq: Three times a day (TID) | ORAL | Status: DC | PRN

## 2020-10-09 MED ORDER — ENOXAPARIN SODIUM 40 MG/0.4ML ~~LOC~~ SOLN
40.0000 mg | SUBCUTANEOUS | Status: DC
  Filled 2020-10-09: qty 0.4

## 2020-10-09 NOTE — Progress Notes (Signed)
Interim progress note  Received call from nurse that CBG is currently 440.  This is about 2 hours after her last CBG was checked and patient had received 20 units NovoLog.  CBG at that time was 493.  An additional 15 units was given per patient sliding scale.  Feel that we can keep the CBG checks every 4 hours as this is not a patient whose sugars will change very rapidly.  Nurse voiced understanding.  We will follow-up with next CBG reading around [place], [place], PGY-3 10/09/2020 11:56 PM

## 2020-10-09 NOTE — H&P (Addendum)
[facility] Admission History and Physical Service Pager: [phone]  Patient name: [name] Medical record number: [id number] Date of birth: 1976-10-18 Age: 44 y.o. Gender: female  Primary Care Provider: [name], MD Consultants: None Code Status: Full, confirmed with patient Preferred Emergency Contact: [name], sister, [phone]  Chief Complaint: chest pain  Assessment and Plan: [name] is a 44 y.o. female presenting with central chest pain . PMH is significant for T2DM, OSA, OHS, HTN, HLD, HFpEF, morbid obesity, GERD, history of left BKA, bipolar disorder with depression, asthma, restless leg syndrome, chronic pain syndrome.  Chest Pain She presents with sudden onset central chest pain that started at 130 this a.m.  Presented via EMS.  Received aspirin and Zofran without improvement.  Initial troponin 20, trended to 17.  EKG without significant acute changes from previous EKGs.  BNP 41.5.  Also received GI cocktail without improvement.  On exam, she continues to have chest pain.  She has been ordered fentanyl by ED provider, but had not yet received.  On examination, her chest pain is reproducible to palpation over the costochondral margin.  CXR consistent with atelectasis and chronic right hemidiaphragm elevation, no evidence of pneumonia.  Patient does have enlarged cardiac silhouette.  Her lung exam is nonfocal, although limited secondary to body habitus.  Vitals are stable aside from intermittently low BPs, lowest 92/41, improved to 113/68 on most recent check.  She has also been intermittently tachypneic and initially tachycardic to 103, now in 90s.  Myoview on 05/13/20 that was low risk.  Per note on 10/5 from cardiology, had discussed proceeding with catheterization of her chest pain continued as her last was in 2013.  Most likely, her chest pain is musculoskeletal in origin given her negative troponins and lack of change in EKG.  Given  cardiology's previous note of a possible cath if pain continued, will go ahead and consult them to see if they would like to see her while inpatient.  Well score 0, therefore doubt PE.  Given the pain does not radiate to the back, aortic dissection is very unlikely, she also remains neurovascularly intact.  She does have mild elevation in her white blood cell count, but does not appear to have any changes on her chest x-ray or symptoms that are consistent with pneumonia.  No evidence of pericarditis on her EKG.  For now, plan to place in observation, obtain EKG in a.m.  No further need to trend troponins as they trended flat. - place in obs, Dr. Lennie Odor service - Vitals per floor protocol - Cardiac telemetry for 12 hours - A.m. EKG and prn for new chest pain - cardiology consulted, plan to see today - 1400 BMP and A1c - AM BMP, CBC - home norco q8h prn for pain - NPO pending possible cath per cardiology recommendations - lovenox for DVT ppx  Hyperglycemia in the setting of poorly controlled T2DM Hx diabetic foot ulcers S/P L BKA and R Trans-met amputation.  Last A1c 12.7 on 06/14/20. CBG on arrival 507, reports that her sugars have not been well controlled at home.  Reports compliance with her insulin.  Current regimen 70/30 100u with breakfast and 90u with supper and Victoza 1.2 mg.  Received 8 units NovoLog in ED, CBGs remain in 500s.  We will go ahead and give her 15 units per moderate sliding scale.  We will continue sliding scale without meals for now, 3 times daily, while she is n.p.o. pending possible catheterization.  She does not meet criteria for DKA as she does not have an anion gap, she is alert and oriented x4, no HHS. - give 15u novolog now - mSSI TID, will need to change to with meals when can eat - CBGs q6h while NPO - hold home victoza and home 70/30 - obtain A1c  Pseudohyponatremia Sodium corrects from 129 to 136 with hyperglycemia. - Monitor BMP - improve hyperglycemia per  above  Chronic HFpEF Last Echo July 2021, EF 55-60%, right ventricle poorly visulaized.  Follows with [facility] Burt [facility].  Had stopped diuresis on her own in September, restarted by her pulmonary doctor, and then increased to torsemide 29m QD on 10/5.  She was due to f/u in 2 weeks with cardiology, but did not. BNP today 41.5, has been compliant with torsemide.  Given body habitus, volume overload is difficult to assess, but given low BNP, does not seem to be fluid overloaded.  No fluid overload on chest x-ray today, O2 sats appropriate, no shortness of breath.  Creatinine is slightly above baseline at 1.2, not technically AKA range.  Her baseline creatinine is around 0.9 on chart review. - Hold home torsemide given lower BPs and increasing creatinine, can reassess tomorrow - Cardiology consult per above  HTN Med list includes norvasc 162mQD, metoprolol 2549mD but patient states that she is not taking.  BPs here have been low, now improved to 123/83. - Hold Norvasc and metoprolol, not sure that she is on these as an outpatient - Monitor BP  Hx SVT Has a history of two episodes, has been doing well on metoprolol per cardiology note from 10/5, but patient reports she is not taking.  Plan for EP referral if happens again. - Holding home metoprolol as patient has not taken, consider restarting as able - Monitor on cardiac telemetry for 12 hours  HLD On Lipitor 45m7m at home. - cont home lipitor  OHS  OSA  Asthma On chronic 2L with BiPAP at night, reports compliance.  On duonebs prn and symbicot BID.  Follows with pulmonology, last seen 9/23 by Dr. DesaShearon StallsBiPAP QHS - duonebs prn - dulera on formulary, holding home symbicort  Bipolar Disorder with Depression No current medications. - cont to monitor  Chronic Pain Syndrome  Takes Norco 10-325mg70m prn pain at home, Flexeril TID prn, neurontin 600mg 77mprn.  PMP reviewed with last fill on 10/15, no red flags. - cont home  norco - cont home gabapentin - holding flexeril, can restart if asks  Restless leg syndrome On ropinirole 1 mg nightly. - Continue home ropinirole  GERD Takes pantoprazole 40 mg daily. - cont home pantoprazole  FEN/GI: NPO pending possible cath Prophylaxis: Lovenox  Disposition: place in observation, telemetry, Dr. McIntyLennie Odorce  History of Present Illness:  Erendira Gisselle Galvis44 y.o29female presenting with central chest pain. Patient reports that when going to bed last night, she was in her usual state of health. Was using BiPAP with 2 L oxygen bled in. States that at 130 this a.m., she awoke suddenly with sharp central chest pain. She called EMS. In route via EMS she received aspirin and Zofran without resolution of her pain. On presentation the ED, she continued to have pain. She states that the pain was associated with nausea. Denies associated shortness of breath. States that the pain is nonradiating and constant. Does not worsen with any movements. Does not radiate to the back. She states that she had not  been having chest pain like this in the recent weeks. Does not think that this is similar to chest pain she has had previously.  In the ED, her troponin was mildly elevated to 20, EKG was unchanged from her previous EKGs. She did not have any change in her pain while being observed, therefore ED recommended admission. Fentanyl have been ordered by ED provider, but she had not yet received it to know if it would help.  She reports that prior to this a.m., she was in her usual state of health. Feeling well. No known sick contacts.  She states that she has been compliant with her insulin and her BiPAP. States that her blood sugars have remained elevated despite taking 100 units of 70/30 in the morning and 90 units of 70/30 in the evening.  Review Of Systems: Per HPI with the following additions:   Review of Systems  Constitutional: Negative for activity change, appetite change,  chills and fever.  HENT: Negative for congestion and sore throat.   Eyes: Negative for visual disturbance.  Respiratory: Negative for cough and shortness of breath.   Cardiovascular: Positive for chest pain. Negative for leg swelling.  Gastrointestinal: Negative for abdominal pain, diarrhea, nausea and vomiting.  Endocrine: Negative for polyuria.  Genitourinary: Negative for difficulty urinating and dysuria.  Skin: Negative for rash.  Neurological: Negative for dizziness and headaches.     Patient Active Problem List   Diagnosis Date Noted  . Elevated troponin   . Acute respiratory failure with hypoxia and hypercarbia ([place]) 06/15/2020  . Acute respiratory failure with hypoxia ([place]) 06/14/2020  . Blister of foot 04/21/2020  . Exposure to mold 04/21/2020  . Constipation 04/03/2020  . Left shoulder pain 06/23/2019  . Hyperglycemia due to diabetes mellitus ([place])   . 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[place]) 04/11/2019  . Allergic rhinitis 03/06/2018  . Class 3 severe obesity due to excess calories with serious comorbidity and body mass index (BMI) of 50.0 to 59.9 in adult [facility])   . Decreased pedal pulses 06/10/2017  . Unilateral primary osteoarthritis, right knee 10/22/2016  . Primary osteoarthritis of first carpometacarpal joint of left hand 07/30/2016  . Diabetic neuropathy ([place]) 07/14/2016  . Nausea 03/17/2016  . De Quervain's tenosynovitis, bilateral 11/01/2015  . Vitamin D deficiency 09/05/2015  . Recurrent candidiasis of vagina 09/05/2015  . Varicose veins of leg with complications 44/96/7591  . Restless leg syndrome 10/17/2014  . Chronic sinusitis 07/18/2014  . Encounter for chronic pain management 06/30/2013  . HLD (hyperlipidemia) 11/19/2012  . Chest pain 06/27/2012  . Right carpal tunnel syndrome 09/01/2011  . Bilateral knee pain 09/01/2011  . DM (diabetes mellitus) type II uncontrolled, periph vascular disorder ([place]) 05/22/2008  . Morbid obesity ([place]) 05/22/2008  . Obesity  hypoventilation syndrome ([place]) 05/22/2008  . Depression 05/22/2008  . Obstructive sleep apnea 05/22/2008  . Hypertension 05/22/2008  . Asthma 05/22/2008  . GERD 05/22/2008    Past Medical History: Past Medical History:  Diagnosis Date  . Acute osteomyelitis of ankle or foot, left ([place]) 01/31/2018  . Alveolar hypoventilation   . Anemia    not on iron pill  . Asthma   . Bipolar 2 disorder ([place])   . Carpal tunnel syndrome on right    recurrent  . Cellulitis 08/2010-08/2011  . Chronic pain   . COPD (chronic obstructive pulmonary disease) ([name])    Symbicort daily and Proventil as needed  . Costochondritis   . Diabetes mellitus type II, uncontrolled ([place]) 2000  Type 2, Uncontrolled.Takes Lantus daily.Fasting blood sugar runs 150  . Drug-seeking behavior   . GERD (gastroesophageal reflux disease)    takes Pantoprazole and Zantac daily  . HLD (hyperlipidemia)    takes Atorvastatin daily  . Hypertension    takes Lisinopril and Coreg daily  . Morbid obesity ([place])   . Nocturia   . OSA on CPAP   . Peripheral neuropathy    takes Gabapentin daily  . Pneumonia    "walking" several yrs ago and as a baby (12/05/2018)  . Rectal fissure   . Restless leg   . SVT (supraventricular tachycardia) ([place])   . Syncope 02/25/2016  . Urinary frequency   . Varicose veins    Right medial thigh and Left leg     Past Surgical History: Past Surgical History:  Procedure Laterality Date  . AMPUTATION Left 02/01/2018   Procedure: LEFT FOURTH AND 5TH TOE RAY AMPUTATION;  Surgeon: [name], MD;  Location: [facility];  Service: Orthopedics;  Laterality: Left;  . AMPUTATION Left 03/03/2018   Procedure: LEFT BELOW KNEE AMPUTATION;  Surgeon: [name], MD;  Location: [place];  Service: Orthopedics;  Laterality: Left;  . CARPAL TUNNEL RELEASE Bilateral   . CESAREAN SECTION  2007  . INCISION AND DRAINAGE PERIRECTAL ABSCESS Left 05/18/2019   Procedure: IRRIGATION AND DEBRIDEMENT OF PANNIS ABSCESS, POSSIBLE  DEBRIDEMENT OF BUTTOCK WOUND;  Surgeon: Donnie Mesa, MD;  Location: [facility];  Service: General;  Laterality: Left;  . IRRIGATION AND DEBRIDEMENT BUTTOCKS Left 05/17/2019   Procedure: IRRIGATION AND DEBRIDEMENT BUTTOCKS;  Surgeon: Donnie Mesa, MD;  Location: [facility];  Service: General;  Laterality: Left;  . KNEE ARTHROSCOPY Right 07/17/2010  . LEFT HEART CATHETERIZATION WITH CORONARY ANGIOGRAM N/A 07/27/2012   Procedure: LEFT HEART CATHETERIZATION WITH CORONARY ANGIOGRAM;  Surgeon: Sherren Mocha, MD;  [facility];  Service: Cardiovascular;  Laterality: N/A;  . MASS EXCISION N/A 06/29/2013   Procedure:  WIDE LOCAL EXCISION OF POSTERIOR NECK ABSCESS;  Surgeon: Ralene Ok, MD;  Location: [place];  Service: General;  Laterality: N/A;  . REPAIR KNEE LIGAMENT Left    "fixed ligaments and chipped patella"  . right transmetatarsal amputation       Social History: Social History   Tobacco Use  . Smoking status: Former Smoker    Packs/day: 0.25    Years: 15.00    Pack years: 3.75    Types: Cigarettes    Quit date: 12/06/2005    Years since quitting: 14.8  . Smokeless tobacco: Never Used  . Tobacco comment: smokes for a couple of months  Vaping Use  . Vaping Use: Never used  Substance Use Topics  . Alcohol use: No    Alcohol/week: 0.0 standard drinks  . Drug use: Yes    Comment: OD attempts on home meds     Additional social history: none  Please also refer to relevant sections of EMR.  Family History: Family History  Problem Relation Age of Onset  . Diabetes Mother   . Hyperlipidemia Mother   . Depression Mother   . GER disease Mother   . Allergic rhinitis Mother   . Restless legs syndrome Mother   . Heart attack Paternal Uncle   . Heart disease Paternal Grandmother   . Heart attack Paternal Grandmother   . Heart attack Paternal Grandfather   . Heart disease Paternal Grandfather   . Heart attack Father   . Migraines Sister   . Cancer Maternal Grandmother  COLON  . Hypertension Maternal Grandmother   . Hyperlipidemia Maternal Grandmother   . Diabetes Maternal Grandmother   . Other Maternal Grandfather        GUN SHOT  . Anxiety disorder Sister   . Asthma Child     Allergies and Medications: Allergies  Allergen Reactions  . Cefepime Other (See Comments)    AKI, see records from [place] hospitalization in January 2020.    Marland Kitchen Kiwi Extract Shortness Of Breath and Swelling  . Morphine And Related Nausea And Vomiting  . [place] [Pentoxifylline] Nausea And Vomiting  . Nubain [Nalbuphine Hcl] Other (See Comments)    "FEELS LIKE SOMETHING CRAWLING ON ME"   No current facility-administered medications on file prior to encounter.   Current Outpatient Medications on File Prior to Encounter  Medication Sig Dispense Refill  . cyclobenzaprine (FLEXERIL) 5 MG tablet Take 5 mg by mouth 3 (three) times daily as needed.    . gabapentin (NEURONTIN) 600 MG tablet TAKE 2 TABLETS BY MOUTH 3 TIMES DAILY (Patient taking differently: Take 1,200 mg by mouth 3 (three) times daily. ) 180 tablet 4  . HYDROcodone-acetaminophen (NORCO) 10-325 MG tablet TAKE 1 TABLET BY MOUTH EVERY EIGHT HOURS AS NEEDED (Patient taking differently: Take 1 tablet by mouth every 8 (eight) hours as needed for moderate pain. ) 90 tablet 0  . insulin isophane & regular human (HUMULIN 70/30 MIX) (70-30) 100 UNIT/ML KwikPen Inject 110 Units into the skin daily with breakfast AND 90 Units daily with supper. 15 mL 0  . potassium chloride SA (KLOR-CON M20) 20 MEQ tablet Take 1 tablet (20 mEq total) by mouth daily. 30 tablet 6  . rOPINIRole (REQUIP) 1 MG tablet Take 1 tablet (1 mg total) by mouth at bedtime. 90 tablet 0  . torsemide (DEMADEX) 20 MG tablet Take 2 tablets (40 mg total) by mouth daily. 60 tablet 6  . VICTOZA 18 MG/3ML SOPN inject 0.6 MG SUBCUTANEOUSLY ONCE A DAY FOR 1 WEEK, THEN INCREASE TO 1.2 MG ONCE daily max 1.8 MG (Patient taking differently: Inject 1.2 mg into the skin daily. ) 6  mL 3  . Accu-Chek Softclix Lancets lancets Use to check blood glucose four times daily 100 each 12  . amLODipine (NORVASC) 10 MG tablet Take 1 tablet (10 mg total) by mouth daily. (Patient not taking: Reported on 10/09/2020) 30 tablet 0  . atorvastatin (LIPITOR) 40 MG tablet Take 1 tablet (40 mg total) by mouth daily. (Patient not taking: Reported on 10/09/2020) 90 tablet 3  . budesonide-formoterol (SYMBICORT) 160-4.5 MCG/ACT inhaler Inhale 2 puffs into the lungs 2 (two) times daily. (Patient not taking: Reported on 10/09/2020) 10.2 g 4  . Cholecalciferol 1000 units tablet Take 1 tablet (1,000 Units total) by mouth daily. (Patient not taking: Reported on 10/09/2020) 90 tablet 0  . Compression Bandages MISC Wear daily for swelling 1 each 0  . Continuous Blood Gluc Sensor (DEXCOM G6 SENSOR) MISC Inject 1 applicator into the skin as directed. Change sensor every 10 days. 3 each 11  . Continuous Blood Gluc Transmit (DEXCOM G6 TRANSMITTER) MISC Inject 1 Device into the skin as directed. Reuse 8 times with sensor changes. 1 each 3  . glucose blood (ACCU-CHEK GUIDE) test strip USE T0 CHECK BLOOD SUGAR 3 TIMES DAILY 100 each 5  . HYDROcodone-acetaminophen (NORCO) 10-325 MG tablet TAKE 1 TABLET BY MOUTH EVERY EIGHT HOURS AS NEEDED (Patient not taking: Reported on 10/09/2020) 90 tablet 0  . Insulin Pen Needle (SURE COMFORT PEN NEEDLES)  32G X 4 MM MISC Use to inject insulin as indicated 100 each 12  . ipratropium-albuterol (DUONEB) 0.5-2.5 (3) MG/3ML SOLN Take 3 mLs by nebulization every 4 (four) hours as needed. (Patient not taking: Reported on 10/09/2020) 360 mL 5  . Lancets (ACCU-CHEK MULTICLIX) lancets 1 each by Other route as needed for other. Use as instructed 100 each 1  . metoprolol succinate (TOPROL XL) 25 MG 24 hr tablet Take 1 tablet (25 mg total) by mouth daily. (Patient not taking: Reported on 10/09/2020) 30 tablet 11  . Misc. Devices (PULSE OXIMETER FOR FINGER) MISC Use as needed to check pulse oxymetry 1  each 0  . Misc. Devices MISC Shrinkers and liners for prosthesis 3 each 0  . sodium chloride (OCEAN) 0.65 % SOLN nasal spray Place 1 spray into both nostrils as needed for congestion. (Patient not taking: Reported on 10/09/2020)  0    Objective: BP 140/76   Pulse 99   Temp 98.9 F (37.2 C) (Oral)   Resp 15   Ht '5\' 2"'  (1.575 m)   Wt (!) 164.2 kg   SpO2 96%   BMI 66.21 kg/m   Physical Exam:  General: 44 y.o. female in NAD HEENT: NCAT, EOMI Neck: full ROM Cardio: RRR no m/r/g, exam somewhat limited secondary to body habitus Lungs: CTAB, no wheezing, no rhonchi, no crackles, no IWOB on RA, exam somewhat limited secondary to body habitus Abdomen: Soft, non-tender to palpation, non-distended Skin: warm and dry Extremities: No edema, s/p left BKA Neuro: Grossly intact, A&O x4 Psych: Mood and affect appropriate for circumstance   Labs and Imaging: CBC BMET  Recent Labs  Lab 10/09/20 0254  WBC 11.6*  HGB 12.0  HCT 38.0  PLT 356   Recent Labs  Lab 10/09/20 0254  NA 129*  K 4.8  CL 91*  CO2 25  BUN 26*  CREATININE 1.20*  GLUCOSE 524*  CALCIUM 8.6*     EKG:  Rate 100, sinus tachycardia, right axis deviation, no significant change from previous  DG Chest Portable 1 View  Result Date: 10/09/2020 CLINICAL DATA:  Chest pain, shortness of breath EXAM: PORTABLE CHEST 1 VIEW COMPARISON:  Radiograph 07/18/2020, CT 06/14/2020 FINDINGS: Chronic elevation of the right hemidiaphragm. Low volumes and atelectasis. Some mild airways thickening is present as well with more hazy retrocardiac and medial right basilar opacity which could reflect developing airspace disease or further atelectatic change. No pneumothorax or visible effusion. Stable cardiomediastinal contours including some slight tortuosity of the right brachiocephalic vasculature which contributes to some medial right apical density stable from comparison. No acute osseous or soft tissue abnormality. Telemetry leads overlie  the chest. IMPRESSION: 1. Low volumes and atelectasis. Chronic elevation of the right hemidiaphragm. 2. Mild airways thickening is present with more hazy retrocardiac and medial right basilar opacity which could reflect a developing infectious/inflammatory process versus further atelectatic change. Electronically Signed   By: Lovena Le M.D.   On: 10/09/2020 04:13    Catrell Morrone, Bernita Raisin, DO 10/09/2020, 9:13 AM PGY-3, [place] Intern pager: [phone], text pages welcome

## 2020-10-09 NOTE — ED Notes (Signed)
Patient o2 saturation drops when laying down and resting. Patient placed on 2L of [place]

## 2020-10-09 NOTE — ED Notes (Signed)
Pt refused maalox and oral lidocaine solution, reporting ordered medication will not help chest pain. Pt describes pain 9/10 at this time. Lavone Orn, PA made aware.

## 2020-10-09 NOTE — Progress Notes (Signed)
Inpatient Diabetes Program Recommendations  AACE/ADA: New Consensus Statement on Inpatient Glycemic Control (2015)  Target Ranges:  Prepandial:   less than 140 mg/dL      Peak postprandial:   less than 180 mg/dL (1-2 hours)      Critically ill patients:  140 - 180 mg/dL   Lab Results  Component Value Date   TOIZTI 458 (HH) 10/09/2020   HGBA1C 12.7 (H) 06/14/2020    Review of Glycemic Control Results for KENEDEE, MOLESKY (MRN [id number]) as of 10/09/2020 11:23  Ref. Range 10/09/2020 07:31 10/09/2020 09:03  Glucose-Capillary Latest Ref Range: 70 - 99 mg/dL 528 (HH) 505 (HH)   Diabetes history: DM 2 Outpatient Diabetes medications:  Humulin 70/30 110 units in the morning and 90 units in the PM Current orders for Inpatient glycemic control:  Novolog moderate tid with meals Inpatient Diabetes Program Recommendations:    Note that blood sugar>500 mg/dL and patient is NPO.  Based on home dose of, 70/30- consider adding NPH 60 units now and q AM and 50 units q HS (this is 80% of NPH portion of 70/30).  Also please increase Novolog correction to q 4 hours.    Thanks  Adah Perl, RN, BC-ADM Inpatient Diabetes Coordinator Pager [phone] (8a-5p)

## 2020-10-09 NOTE — Progress Notes (Signed)
INTERIM PROGRESS NOTE  Received call from patient's nurse that her most recent CBG was very elevated at 493.  Patient has moderate sliding scale insulin order placed which requires contacting MD for insulin order for CBG greater than 400.  Sliding scale indicates 15 units of NovoLog for CBG 351-400.  Order placed for one-time dose of 20 units of insulin.  Patient is scheduled for repeat CBC check in 4 hours.  Nurse was also asked to please not provide the patient with any snacks throughout the night, and if the patient requests a drink she can have water or a diet soda.  Do not recommend any juice due to sugar content.   Milus Banister, [place], PGY-3 10/09/2020 8:56 PM

## 2020-10-09 NOTE — ED Triage Notes (Addendum)
EMS arrival from home co centeralized chest pain x1 hour starting suddenly. Per EMS pt reports minimal improvement post 1 sublingual nitro. Per EMS pt wears 2L O2 at nightly and PRN. CBG reading HIGH on home and EMS glucometer   324mg  Aspirin, 4mg  Zofran IV 20G AC 2L O2 SPO2 99%

## 2020-10-09 NOTE — Consult Note (Signed)
CARDIOLOGY CONSULT NOTE       Patient ID: [name] MRN: [id number] DOB/AGE: [birth year] 44 y.o.  Admit date: 10/09/2020 Referring Physician: Sandi Carne  Primary Physician: [name], MD Primary Cardiologist: Burt Knack Reason for Consultation: Chest Pain  Active Problems:   Chest pain   Hyperglycemia due to diabetes mellitus ([place])   HPI:  44 y.o. with morbid obesity, DM, OSA, HTN, HLD Left BKA Previous chest pain w/u by Dr Burt Knack Had normal myovue 05/13/20 And normal echo 06/15/20  With EF 55-60% no RWMAls At rest early this am had sudden onset resting central chest pain. EMS Rx With Zofran and ASA ECG normal and non acute Troponin 20->17. GI cocktail also did not help much. Pain to palpation of chest CXR NAD given body habitus with low lung volumes BNP and D dimer also normal She is currently more comfortable No pleuritic Component She has poor functional status   ROS All other systems reviewed and negative except as noted above  Past Medical History:  Diagnosis Date  . Acute osteomyelitis of ankle or foot, left ([place]) 01/31/2018  . Alveolar hypoventilation   . Anemia    not on iron pill  . Asthma   . Bipolar 2 disorder ([place])   . Carpal tunnel syndrome on right    recurrent  . Cellulitis 08/2010-08/2011  . Chronic pain   . COPD (chronic obstructive pulmonary disease) ([name])    Symbicort daily and Proventil as needed  . Costochondritis   . Diabetes mellitus type II, uncontrolled ([place]) 2000   Type 2, Uncontrolled.Takes Lantus daily.Fasting blood sugar runs 150  . Drug-seeking behavior   . GERD (gastroesophageal reflux disease)    takes Pantoprazole and Zantac daily  . HLD (hyperlipidemia)    takes Atorvastatin daily  . Hypertension    takes Lisinopril and Coreg daily  . Morbid obesity ([place])   . Nocturia   . OSA on CPAP   . Peripheral neuropathy    takes Gabapentin daily  . Pneumonia    "walking" several yrs ago and as a baby (12/05/2018)  . Rectal fissure     . Restless leg   . SVT (supraventricular tachycardia) ([place])   . Syncope 02/25/2016  . Urinary frequency   . Varicose veins    Right medial thigh and Left leg     Family History  Problem Relation Age of Onset  . Diabetes Mother   . Hyperlipidemia Mother   . Depression Mother   . GER disease Mother   . Allergic rhinitis Mother   . Restless legs syndrome Mother   . Heart attack Paternal Uncle   . Heart disease Paternal Grandmother   . Heart attack Paternal Grandmother   . Heart attack Paternal Grandfather   . Heart disease Paternal Grandfather   . Heart attack Father   . Migraines Sister   . Cancer Maternal Grandmother        COLON  . Hypertension Maternal Grandmother   . Hyperlipidemia Maternal Grandmother   . Diabetes Maternal Grandmother   . Other Maternal Grandfather        GUN SHOT  . Anxiety disorder Sister   . Asthma Child     Social History   Socioeconomic History  . Marital status: Married    Spouse name: Not on file  . Number of children: Not on file  . Years of education: Not on file  . Highest education level: Not on file  Occupational History  . Not on file  Tobacco Use  . Smoking status: Former Smoker    Packs/day: 0.25    Years: 15.00    Pack years: 3.75    Types: Cigarettes    Quit date: 12/06/2005    Years since quitting: 14.8  . Smokeless tobacco: Never Used  . Tobacco comment: smokes for a couple of months  Vaping Use  . Vaping Use: Never used  Substance and Sexual Activity  . Alcohol use: No    Alcohol/week: 0.0 standard drinks  . Drug use: Yes    Comment: OD attempts on home meds    . Sexual activity: Yes    Partners: Male  Other Topics Concern  . Not on file  Social History Narrative   Lives in [place] with her fiance and 42 yr old dtr.   Social Determinants of Health   Financial Resource Strain:   . Difficulty of Paying Living Expenses: Not on file  Food Insecurity:   . Worried About Charity fundraiser in the Last Year: Not on file   . Ran Out of Food in the Last Year: Not on file  Transportation Needs:   . Lack of Transportation (Medical): Not on file  . Lack of Transportation (Non-Medical): Not on file  Physical Activity:   . Days of Exercise per Week: Not on file  . Minutes of Exercise per Session: Not on file  Stress:   . Feeling of Stress : Not on file  Social Connections:   . Frequency of Communication with Friends and Family: Not on file  . Frequency of Social Gatherings with Friends and Family: Not on file  . Attends Religious Services: Not on file  . Active Member of Clubs or Organizations: Not on file  . Attends Archivist Meetings: Not on file  . Marital Status: Not on file  Intimate Partner Violence:   . Fear of Current or Ex-Partner: Not on file  . Emotionally Abused: Not on file  . Physically Abused: Not on file  . Sexually Abused: Not on file    Past Surgical History:  Procedure Laterality Date  . AMPUTATION Left 02/01/2018   Procedure: LEFT FOURTH AND 5TH TOE RAY AMPUTATION;  Surgeon: [name], MD;  Location: [facility];  Service: Orthopedics;  Laterality: Left;  . AMPUTATION Left 03/03/2018   Procedure: LEFT BELOW KNEE AMPUTATION;  Surgeon: [name], MD;  Location: [facility];  Service: Orthopedics;  Laterality: Left;  . CARPAL TUNNEL RELEASE Bilateral   . CESAREAN SECTION  2007  . INCISION AND DRAINAGE PERIRECTAL ABSCESS Left 05/18/2019   Procedure: IRRIGATION AND DEBRIDEMENT OF PANNIS ABSCESS, POSSIBLE DEBRIDEMENT OF BUTTOCK WOUND;  Surgeon: Donnie Mesa, MD;  Location: [facility];  Service: General;  Laterality: Left;  . IRRIGATION AND DEBRIDEMENT BUTTOCKS Left 05/17/2019   Procedure: IRRIGATION AND DEBRIDEMENT BUTTOCKS;  Surgeon: Donnie Mesa, MD;  Location: [facility];  Service: General;  Laterality: Left;  . KNEE ARTHROSCOPY Right 07/17/2010  . LEFT HEART CATHETERIZATION WITH CORONARY ANGIOGRAM N/A 07/27/2012   Procedure: LEFT HEART CATHETERIZATION WITH CORONARY ANGIOGRAM;  Surgeon:  Sherren Mocha, MD;  Location: [facility] CATH LAB;  Service: Cardiovascular;  Laterality: N/A;  . MASS EXCISION N/A 06/29/2013   Procedure:  WIDE LOCAL EXCISION OF POSTERIOR NECK ABSCESS;  Surgeon: Ralene Ok, MD;  Location: [facility];  Service: General;  Laterality: N/A;  . REPAIR KNEE LIGAMENT Left    "fixed ligaments and chipped patella"  . right transmetatarsal amputation  Current Facility-Administered Medications:  .  atorvastatin (LIPITOR) tablet 40 mg, 40 mg, Oral, Daily, Meccariello, Bernita Raisin, DO .  enoxaparin (LOVENOX) injection 80 mg, 80 mg, Subcutaneous, Q24H, Meccariello, Bernita Raisin, DO .  gabapentin (NEURONTIN) tablet 1,200 mg, 1,200 mg, Oral, TID, Wilber Oliphant, MD .  HYDROcodone-acetaminophen (NORCO) 10-325 MG per tablet 1 tablet, 1 tablet, Oral, Q8H PRN, Meccariello, Bernita Raisin, DO .  insulin aspart (novoLOG) injection 0-15 Units, 0-15 Units, Subcutaneous, TID with meals, [name], [name], MD .  ipratropium-albuterol (DUONEB) 0.5-2.5 (3) MG/3ML nebulizer solution 3 mL, 3 mL, Nebulization, Q4H PRN, Meccariello, Bernita Raisin, DO .  mometasone-formoterol (DULERA) 200-5 MCG/ACT inhaler 2 puff, 2 puff, Inhalation, BID, Meccariello, Bernita Raisin, DO .  pantoprazole (PROTONIX) EC tablet 40 mg, 40 mg, Oral, Daily, Meccariello, Bernita Raisin, DO .  rOPINIRole (REQUIP) tablet 1 mg, 1 mg, Oral, QHS, Meccariello, Bernita Raisin, DO  Current Outpatient Medications:  .  cyclobenzaprine (FLEXERIL) 5 MG tablet, Take 5 mg by mouth 3 (three) times daily as needed., Disp: , Rfl:  .  gabapentin (NEURONTIN) 600 MG tablet, TAKE 2 TABLETS BY MOUTH 3 TIMES DAILY (Patient taking differently: Take 1,200 mg by mouth 3 (three) times daily. ), Disp: 180 tablet, Rfl: 4 .  HYDROcodone-acetaminophen (NORCO) 10-325 MG tablet, TAKE 1 TABLET BY MOUTH EVERY EIGHT HOURS AS NEEDED (Patient taking differently: Take 1 tablet by mouth every 8 (eight) hours as needed for moderate pain. ), Disp: 90 tablet, Rfl: 0 .  insulin isophane &  regular human (HUMULIN 70/30 MIX) (70-30) 100 UNIT/ML KwikPen, Inject 110 Units into the skin daily with breakfast AND 90 Units daily with supper., Disp: 15 mL, Rfl: 0 .  potassium chloride SA (KLOR-CON M20) 20 MEQ tablet, Take 1 tablet (20 mEq total) by mouth daily., Disp: 30 tablet, Rfl: 6 .  rOPINIRole (REQUIP) 1 MG tablet, Take 1 tablet (1 mg total) by mouth at bedtime., Disp: 90 tablet, Rfl: 0 .  torsemide (DEMADEX) 20 MG tablet, Take 2 tablets (40 mg total) by mouth daily., Disp: 60 tablet, Rfl: 6 .  VICTOZA 18 MG/3ML SOPN, inject 0.6 MG SUBCUTANEOUSLY ONCE A DAY FOR 1 WEEK, THEN INCREASE TO 1.2 MG ONCE daily max 1.8 MG (Patient taking differently: Inject 1.2 mg into the skin daily. ), Disp: 6 mL, Rfl: 3 .  Accu-Chek Softclix Lancets lancets, Use to check blood glucose four times daily, Disp: 100 each, Rfl: 12 .  amLODipine (NORVASC) 10 MG tablet, Take 1 tablet (10 mg total) by mouth daily. (Patient not taking: Reported on 10/09/2020), Disp: 30 tablet, Rfl: 0 .  atorvastatin (LIPITOR) 40 MG tablet, Take 1 tablet (40 mg total) by mouth daily. (Patient not taking: Reported on 10/09/2020), Disp: 90 tablet, Rfl: 3 .  budesonide-formoterol (SYMBICORT) 160-4.5 MCG/ACT inhaler, Inhale 2 puffs into the lungs 2 (two) times daily. (Patient not taking: Reported on 10/09/2020), Disp: 10.2 g, Rfl: 4 .  Cholecalciferol 1000 units tablet, Take 1 tablet (1,000 Units total) by mouth daily. (Patient not taking: Reported on 10/09/2020), Disp: 90 tablet, Rfl: 0 .  Compression Bandages MISC, Wear daily for swelling, Disp: 1 each, Rfl: 0 .  Continuous Blood Gluc Sensor (DEXCOM G6 SENSOR) MISC, Inject 1 applicator into the skin as directed. Change sensor every 10 days., Disp: 3 each, Rfl: 11 .  Continuous Blood Gluc Transmit (DEXCOM G6 TRANSMITTER) MISC, Inject 1 Device into the skin as directed. Reuse 8 times with sensor changes., Disp: 1 each, Rfl: 3 .  glucose blood (  ACCU-CHEK GUIDE) test strip, USE T0 CHECK BLOOD SUGAR  3 TIMES DAILY, Disp: 100 each, Rfl: 5 .  Insulin Pen Needle (SURE COMFORT PEN NEEDLES) 32G X 4 MM MISC, Use to inject insulin as indicated, Disp: 100 each, Rfl: 12 .  ipratropium-albuterol (DUONEB) 0.5-2.5 (3) MG/3ML SOLN, Take 3 mLs by nebulization every 4 (four) hours as needed. (Patient not taking: Reported on 10/09/2020), Disp: 360 mL, Rfl: 5 .  Lancets (ACCU-CHEK MULTICLIX) lancets, 1 each by Other route as needed for other. Use as instructed, Disp: 100 each, Rfl: 1 .  metoprolol succinate (TOPROL XL) 25 MG 24 hr tablet, Take 1 tablet (25 mg total) by mouth daily. (Patient not taking: Reported on 10/09/2020), Disp: 30 tablet, Rfl: 11 .  Misc. Devices (PULSE OXIMETER FOR FINGER) MISC, Use as needed to check pulse oxymetry, Disp: 1 each, Rfl: 0 .  Misc. Devices MISC, Shrinkers and liners for prosthesis, Disp: 3 each, Rfl: 0 . atorvastatin  40 mg Oral Daily  . enoxaparin (LOVENOX) injection  80 mg Subcutaneous Q24H  . gabapentin  1,200 mg Oral TID  . insulin aspart  0-15 Units Subcutaneous TID with meals  . mometasone-formoterol  2 puff Inhalation BID  . pantoprazole  40 mg Oral Daily  . rOPINIRole  1 mg Oral QHS     Physical Exam: Blood pressure 140/76, pulse 99, temperature 98.9 F (37.2 C), temperature source Oral, resp. rate 15, height 5\' 2"  (1.575 m), weight (!) 164.2 kg, SpO2 96 %.   Affect appropriate Obese female  HEENT: normal Neck supple with no adenopathy JVP normal no bruits no thyromegaly Lungs clear with no wheezing and good diaphragmatic motion Heart:  S1/S2 no murmur, no rub, gallop or click PMI normal Abdomen: benighn, BS positve, no tenderness, no AAA no bruit.  No HSM or HJR Distal pulses intact with no bruits No edema Neuro non-focal Skin warm and dry Left BKA    Labs:   Lab Results  Component Value Date   WBC 11.6 (H) 10/09/2020   HGB 12.0 10/09/2020   HCT 38.0 10/09/2020   MCV 92.9 10/09/2020   PLT 356 10/09/2020    Recent Labs  Lab  10/09/20 0254  NA 129*  K 4.8  CL 91*  CO2 25  BUN 26*  CREATININE 1.20*  CALCIUM 8.6*  PROT 6.5  BILITOT 0.4  ALKPHOS 121  ALT 21  AST 16  GLUCOSE 524*   Lab Results  Component Value Date   CKTOTAL 73 02/01/2018   CKMB 1.2 07/27/2012   TROPONINI <0.03 10/30/2016    Lab Results  Component Value Date   CHOL 274 (H) 04/10/2020   CHOL 343 (H) 09/19/2018   CHOL 220 (H) 01/11/2017   Lab Results  Component Value Date   HDL 39 (L) 04/10/2020   HDL 35 (L) 09/19/2018   HDL 35 (L) 01/11/2017   Lab Results  Component Value Date   LDLCALC 173 (H) 04/10/2020   LDLCALC Comment 09/19/2018   LDLCALC 133 (H) 01/11/2017   Lab Results  Component Value Date   TRIG 324 (H) 04/10/2020   TRIG 456 (H) 09/19/2018   TRIG 262 (H) 01/11/2017   Lab Results  Component Value Date   CHOLHDL 7.0 (H) 04/10/2020   CHOLHDL 9.8 (H) 09/19/2018   CHOLHDL 6.3 (H) 01/11/2017   Lab Results  Component Value Date   LDLDIRECT 164.9 11/15/2014   LDLDIRECT 191 (H) 03/15/2012      Radiology: DG Chest Portable 1 View  Result Date: 10/09/2020 CLINICAL DATA:  Chest pain, shortness of breath EXAM: PORTABLE CHEST 1 VIEW COMPARISON:  Radiograph 07/18/2020, CT 06/14/2020 FINDINGS: Chronic elevation of the right hemidiaphragm. Low volumes and atelectasis. Some mild airways thickening is present as well with more hazy retrocardiac and medial right basilar opacity which could reflect developing airspace disease or further atelectatic change. No pneumothorax or visible effusion. Stable cardiomediastinal contours including some slight tortuosity of the right brachiocephalic vasculature which contributes to some medial right apical density stable from comparison. No acute osseous or soft tissue abnormality. Telemetry leads overlie the chest. IMPRESSION: 1. Low volumes and atelectasis. Chronic elevation of the right hemidiaphragm. 2. Mild airways thickening is present with more hazy retrocardiac and medial right  basilar opacity which could reflect a developing infectious/inflammatory process versus further atelectatic change. Electronically Signed   By: Lovena Le M.D.   On: 10/09/2020 04:13    EKG: NSR no acute changes    ASSESSMENT AND PLAN:   1. Chest Pain:  Non cardiac. No acute ECG changes , negative troponin with no trend. Pain to palpation. Myovue done fairly recently 05/09/20 no  Ischemia. Can admit for 24 hour obs and d/c in am if clinically better. Agree that there is also low suspicion for PE with negative d dimer and CXR Ok given body habitus. Will arrange outpatient f/u with Dr Burt Knack.   2. DM: Discussed low carb diet.  Target hemoglobin A1c is 6.5 or less.  Continue current medications.  3. HLD:  Continue statin   4. HFpEF:  Hard to judge volume status due to morbid obesity BNP normal continue home Demadex  Signed: Jenkins Rouge 10/09/2020, 10:47 AM

## 2020-10-09 NOTE — ED Provider Notes (Signed)
[facility] EMERGENCY DEPARTMENT Provider Note   CSN: [id number] Arrival date & time: 10/09/20  0250     History Chief Complaint  Patient presents with  . Chest Pain    [name] is a 44 y.o. female.  HPI Patient is a 44 year old female with past medical history of poorly controlled DM 2, morbid obesity, obesity hypoventilation syndrome, depression, obstructive sleep apnea, hypertension, asthma, HLD, chronic pain, restless leg syndrome, left BKA, right midfoot amputation.   Patient is 44 year old female presented today with chest pain that woke her from her sleep approximately 1:30 AM.  She states that it came on abruptly insomuch as it woke her up from her sleep.  She went to sleep yesterday evening without any symptoms.  She states that she did go to sleep upset because the day visitor who is been staying in her house and has been overseeing her walk around.  She was brought in by EMS who placed patient on 2 L nasal cannula oxygen as patient is on this long-term at night as needed.  Her CBG was undetectably high with EMS.  She has had 324 mg of aspirin, 4 mg of Zofran and started 99 percent by SPO2 on 2 L nasal cannula.   She is complaining of severe sharp stabbing chest pain that she states is constant with no aggravating or mitigating factors.  She states that it does not appear to be exertional although she is unable to exert herself significantly because of her lower extremity amputations.  She gets around in a wheelchair typically. She states it is nonpleuritic.  She states she has felt nauseous but had no episodes of emesis.  Feels somewhat less nauseous now after Zofran.  Has a history of reflux but states that this does not feel similar to that.  She states that the pain does seem to be worse with laying down. She states that she has associated shortness of breath.  Denies any cough, fevers, chills, lightheadedness, dizziness, headache, abdominal pain, diarrhea,  vomiting, urinary symptoms.  She denies any swelling to her legs no calf pain or swelling.  No recent hospitalizations or immobilization.  Does admit that she has not been taking her insulin that she is prescribed which is a long-acting insulin.       Past Medical History:  Diagnosis Date  . Acute osteomyelitis of ankle or foot, left ([place]) 01/31/2018  . Alveolar hypoventilation   . Anemia    not on iron pill  . Asthma   . Bipolar 2 disorder ([place])   . Carpal tunnel syndrome on right    recurrent  . Cellulitis 08/2010-08/2011  . Chronic pain   . COPD (chronic obstructive pulmonary disease) ([name])    Symbicort daily and Proventil as needed  . Costochondritis   . Diabetes mellitus type II, uncontrolled ([place]) 2000   Type 2, Uncontrolled.Takes Lantus daily.Fasting blood sugar runs 150  . Drug-seeking behavior   . GERD (gastroesophageal reflux disease)    takes Pantoprazole and Zantac daily  . HLD (hyperlipidemia)    takes Atorvastatin daily  . Hypertension    takes Lisinopril and Coreg daily  . Morbid obesity ([place])   . Nocturia   . OSA on CPAP   . Peripheral neuropathy    takes Gabapentin daily  . Pneumonia    "walking" several yrs ago and as a baby (12/05/2018)  . Rectal fissure   . Restless leg   . SVT (supraventricular tachycardia) ([place])   . Syncope  02/25/2016  . Urinary frequency   . Varicose veins    Right medial thigh and Left leg     Patient Active Problem List   Diagnosis Date Noted  . Acute respiratory failure with hypoxia and hypercarbia ([place]) 06/15/2020  . Acute respiratory failure with hypoxia ([place]) 06/14/2020  . Blister of foot 04/21/2020  . Exposure to mold 04/21/2020  . Constipation 04/03/2020  . Left shoulder pain 06/23/2019  . 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[place]) 04/11/2019  . Allergic rhinitis 03/06/2018  . Class 3 severe obesity due to excess calories with serious comorbidity and body mass index (BMI) of 50.0 to 59.9 in adult [facility])   . Decreased pedal pulses  06/10/2017  . Unilateral primary osteoarthritis, right knee 10/22/2016  . Primary osteoarthritis of first carpometacarpal joint of left hand 07/30/2016  . Diabetic neuropathy ([place]) 07/14/2016  . Nausea 03/17/2016  . De Quervain's tenosynovitis, bilateral 11/01/2015  . Vitamin D deficiency 09/05/2015  . Recurrent candidiasis of vagina 09/05/2015  . Varicose veins of leg with complications 61/44/3154  . Restless leg syndrome 10/17/2014  . Chronic sinusitis 07/18/2014  . Encounter for chronic pain management 06/30/2013  . HLD (hyperlipidemia) 11/19/2012  . Chest pain 06/27/2012  . Right carpal tunnel syndrome 09/01/2011  . Bilateral knee pain 09/01/2011  . DM (diabetes mellitus) type II uncontrolled, periph vascular disorder ([place]) 05/22/2008  . Morbid obesity ([place]) 05/22/2008  . Obesity hypoventilation syndrome ([place]) 05/22/2008  . Depression 05/22/2008  . Obstructive sleep apnea 05/22/2008  . Hypertension 05/22/2008  . Asthma 05/22/2008  . GERD 05/22/2008    Past Surgical History:  Procedure Laterality Date  . AMPUTATION Left 02/01/2018   Procedure: LEFT FOURTH AND 5TH TOE RAY AMPUTATION;  Surgeon: [name], MD;  Location: [facility];  Service: Orthopedics;  Laterality: Left;  . AMPUTATION Left 03/03/2018   Procedure: LEFT BELOW KNEE AMPUTATION;  Surgeon: [name], MD;  Location: [facility];  Service: Orthopedics;  Laterality: Left;  . CARPAL TUNNEL RELEASE Bilateral   . CESAREAN SECTION  2007  . INCISION AND DRAINAGE PERIRECTAL ABSCESS Left 05/18/2019   Procedure: IRRIGATION AND DEBRIDEMENT OF PANNIS ABSCESS, POSSIBLE DEBRIDEMENT OF BUTTOCK WOUND;  Surgeon: Donnie Mesa, MD;  Location: [facility];  Service: General;  Laterality: Left;  . IRRIGATION AND DEBRIDEMENT BUTTOCKS Left 05/17/2019   Procedure: IRRIGATION AND DEBRIDEMENT BUTTOCKS;  Surgeon: Donnie Mesa, MD;  Location: [facility];  Service: General;  Laterality: Left;  . KNEE ARTHROSCOPY Right 07/17/2010  . LEFT HEART  CATHETERIZATION WITH CORONARY ANGIOGRAM N/A 07/27/2012   Procedure: LEFT HEART CATHETERIZATION WITH CORONARY ANGIOGRAM;  Surgeon: Sherren Mocha, MD;  Location: [facility] CATH LAB;  Service: Cardiovascular;  Laterality: N/A;  . MASS EXCISION N/A 06/29/2013   Procedure:  WIDE LOCAL EXCISION OF POSTERIOR NECK ABSCESS;  Surgeon: Ralene Ok, MD;  Location: [facility];  Service: General;  Laterality: N/A;  . REPAIR KNEE LIGAMENT Left    "fixed ligaments and chipped patella"  . right transmetatarsal amputation        OB History    Gravida  2   Para  1   Term      Preterm      AB  1   Living  1     SAB  1   TAB      Ectopic      Multiple      Live Births              Family History  Problem Relation  Age of Onset  . Diabetes Mother   . Hyperlipidemia Mother   . Depression Mother   . GER disease Mother   . Allergic rhinitis Mother   . Restless legs syndrome Mother   . Heart attack Paternal Uncle   . Heart disease Paternal Grandmother   . Heart attack Paternal Grandmother   . Heart attack Paternal Grandfather   . Heart disease Paternal Grandfather   . Heart attack Father   . Migraines Sister   . Cancer Maternal Grandmother        COLON  . Hypertension Maternal Grandmother   . Hyperlipidemia Maternal Grandmother   . Diabetes Maternal Grandmother   . Other Maternal Grandfather        GUN SHOT  . Anxiety disorder Sister   . Asthma Child     Social History   Tobacco Use  . Smoking status: Former Smoker    Packs/day: 0.25    Years: 15.00    Pack years: 3.75    Types: Cigarettes    Quit date: 12/06/2005    Years since quitting: 14.8  . Smokeless tobacco: Never Used  . Tobacco comment: smokes for a couple of months  Vaping Use  . Vaping Use: Never used  Substance Use Topics  . Alcohol use: No    Alcohol/week: 0.0 standard drinks  . Drug use: Yes    Comment: OD attempts on home meds      Home Medications Prior to Admission medications   Medication Sig Start  Date End Date Taking? Authorizing Provider  Accu-Chek Softclix Lancets lancets Use to check blood glucose four times daily 04/17/20   Martyn Malay, MD  amLODipine (NORVASC) 10 MG tablet Take 1 tablet (10 mg total) by mouth daily. 05/25/19   [name], MD  atorvastatin (LIPITOR) 40 MG tablet Take 1 tablet (40 mg total) by mouth daily. 04/11/20   [name], MD  budesonide-formoterol [facility]) 160-4.5 MCG/ACT inhaler Inhale 2 puffs into the lungs 2 (two) times daily. 05/09/20   [name], MD  Cholecalciferol 1000 units tablet Take 1 tablet (1,000 Units total) by mouth daily. 04/05/17   [name], MD  collagenase (SANTYL) ointment Santyl 250 unit/gram topical ointment  APPLY TO CLEANSED AFFECTED AREA BY TOPICAL ROUTE ONCE DAILY    [provider]  Compression Bandages MISC Wear daily for swelling 07/10/20   [name], MD  Continuous Blood Gluc Sensor (DEXCOM G6 SENSOR) MISC Inject 1 applicator into the skin as directed. Change sensor every 10 days. 04/10/20   Leavy Cella, RPH-CPP  Continuous Blood Gluc Transmit (DEXCOM G6 TRANSMITTER) MISC Inject 1 Device into the skin as directed. Reuse 8 times with sensor changes. 04/10/20   Leavy Cella, RPH-CPP  cyclobenzaprine (FLEXERIL) 5 MG tablet Take 5 mg by mouth 3 (three) times daily as needed. 09/03/20   [provider]  gabapentin (NEURONTIN) 600 MG tablet TAKE 2 TABLETS BY MOUTH 3 TIMES DAILY 05/09/20   [name], MD  glucose blood (ACCU-CHEK GUIDE) test strip USE T0 CHECK BLOOD SUGAR 3 TIMES DAILY 04/18/20   Leavy Cella, RPH-CPP  HYDROcodone-acetaminophen (NORCO) 10-325 MG tablet TAKE 1 TABLET BY MOUTH EVERY EIGHT HOURS AS NEEDED 08/12/20   [name], MD  HYDROcodone-acetaminophen (NORCO) 10-325 MG tablet TAKE 1 TABLET BY MOUTH EVERY EIGHT HOURS AS NEEDED 08/12/20   [name], MD  insulin isophane & regular human (HUMULIN 70/30 MIX) (70-30) 100 UNIT/ML KwikPen Inject  110 Units into the skin daily with breakfast AND 90 Units daily with supper. 06/23/20   Patriciaann Clan, DO  Insulin Pen Needle (SURE COMFORT PEN NEEDLES) 32G X 4 MM MISC Use to inject insulin as indicated 04/17/20   Martyn Malay, MD  ipratropium-albuterol (DUONEB) 0.5-2.5 (3) MG/3ML SOLN Take 3 mLs by nebulization every 4 (four) hours as needed. 06/25/20   [name], MD  Lancets (ACCU-CHEK MULTICLIX) lancets 1 each by Other route as needed for other. Use as instructed 04/18/20   Leavy Cella, RPH-CPP  metoprolol succinate (TOPROL XL) 25 MG 24 hr tablet Take 1 tablet (25 mg total) by mouth daily. 05/28/20 05/23/21  Sherren Mocha, MD  Misc. Devices (PULSE OXIMETER FOR FINGER) MISC Use as needed to check pulse oxymetry 06/20/20   [name], MD  Misc. Devices MISC Shrinkers and liners for prosthesis 06/20/20   [name], MD  pantoprazole (PROTONIX) 40 MG tablet Take 1 tablet (40 mg total) by mouth daily. 03/16/18   Angiulli, Lavon Paganini, PA-C  potassium chloride SA (KLOR-CON M20) 20 MEQ tablet Take 1 tablet (20 mEq total) by mouth daily. 09/09/20   Kathlen Mody, Scott T, PA-C  PROVENTIL HFA 108 [phone] Base) MCG/ACT inhaler Inhale 1-2 puffs into the lungs every 6 (six) hours as needed for wheezing or shortness of breath. 05/11/19   [name], MD  rOPINIRole (REQUIP) 1 MG tablet Take 1 tablet (1 mg total) by mouth at bedtime. 08/12/20   [name], MD  sodium chloride (OCEAN) 0.65 % SOLN nasal spray Place 1 spray into both nostrils as needed for congestion. 06/23/20   Patriciaann Clan, DO  terconazole (TERAZOL 7) 0.4 % vaginal cream terconazole 0.4 % vaginal cream  Place 1 applicator vaginally at bedtime.    [provider]  torsemide (DEMADEX) 20 MG tablet Take 2 tablets (40 mg total) by mouth daily. 09/09/20   Weaver, Scott T, PA-C  VICTOZA 18 MG/3ML SOPN inject 0.6 MG SUBCUTANEOUSLY ONCE A DAY FOR 1 WEEK, THEN INCREASE TO 1.2 MG ONCE daily max 1.8 MG 04/16/20    [name], MD    Allergies    Cefepime, Kiwi extract, Morphine and related, Trental [pentoxifylline], and Nubain [nalbuphine hcl]  Review of Systems   Review of Systems  Constitutional: Negative for chills and fever.  HENT: Negative for congestion.   Eyes: Negative for pain.  Respiratory: Positive for shortness of breath. Negative for cough.   Cardiovascular: Positive for chest pain. Negative for leg swelling.  Gastrointestinal: Positive for nausea. Negative for abdominal distention, abdominal pain, diarrhea and vomiting.  Genitourinary: Negative for dysuria.  Musculoskeletal: Negative for myalgias.  Skin: Negative for rash.  Neurological: Negative for dizziness and headaches.    Physical Exam Updated Vital Signs BP 99/67   Pulse 90   Temp 99.1 F (37.3 C) (Oral)   Resp 13   Ht 5\' 2"  (1.575 m)   Wt (!) 164.2 kg   SpO2 92%   BMI 66.21 kg/m   Physical Exam Vitals and nursing note reviewed.  Constitutional:      General: She is not in acute distress.    Appearance: She is obese.     Comments: Patient is morbidly obese, pleasant 44 year old female.  She appears in acute pain.  She is tearful and swinging back and forth as she cries.  She is able to speak in full sentences.  HENT:     Head: Normocephalic and atraumatic.  Nose: Nose normal.  Eyes:     General: No scleral icterus. Cardiovascular:     Rate and Rhythm: Normal rate and regular rhythm.     Pulses: Normal pulses.     Heart sounds: Normal heart sounds.  Pulmonary:     Effort: Pulmonary effort is normal. No respiratory distress.     Breath sounds: No wheezing.     Comments: No increased work of breathing.  Speaking full sentences.  Lungs auscultated in all fields --no wheezing or rales auscultated. Abdominal:     Palpations: Abdomen is soft.     Tenderness: There is no abdominal tenderness.     Comments: Large pannus which is without any erythema or cellulitic changes.  No tenderness palpation  of the abdomen.  Musculoskeletal:     Cervical back: Normal range of motion.     Right lower leg: No edema.     Left lower leg: No edema.     Comments: Left BKA stump is without any evidence of infection no erythema warmth or tenderness to palpation.  Right-sided  Skin:    General: Skin is warm and dry.     Capillary Refill: Capillary refill takes less than 2 seconds.  Neurological:     Mental Status: She is alert. Mental status is at baseline.  Psychiatric:        Mood and Affect: Mood normal.        Behavior: Behavior normal.     ED Results / Procedures / Treatments   Labs (all labs ordered are listed, but only abnormal results are displayed) Labs Reviewed  CBC - Abnormal; Notable for the following components:      Result Value   WBC 11.6 (*)    All other components within normal limits  COMPREHENSIVE METABOLIC PANEL - Abnormal; Notable for the following components:   Sodium 129 (*)    Chloride 91 (*)    Glucose, Bld 524 (*)    BUN 26 (*)    Creatinine, Ser 1.20 (*)    Calcium 8.6 (*)    Albumin 2.5 (*)    GFR, Estimated 58 (*)    All other components within normal limits  CBG MONITORING, ED - Abnormal; Notable for the following components:   Glucose-Capillary 520 (*)    All other components within normal limits  CBG MONITORING, ED - Abnormal; Notable for the following components:   Glucose-Capillary 551 (*)    All other components within normal limits  TROPONIN I (HIGH SENSITIVITY) - Abnormal; Notable for the following components:   Troponin I (High Sensitivity) 20 (*)    All other components within normal limits  RESPIRATORY PANEL BY RT PCR (FLU A&B, COVID)  LIPASE, BLOOD  D-DIMER, QUANTITATIVE (NOT AT [facility])  BRAIN NATRIURETIC PEPTIDE  I-STAT BETA HCG BLOOD, ED ([facility], [facility], [facility] ONLY)  TROPONIN I (HIGH SENSITIVITY)    EKG EKG Interpretation  Date/Time:  Thursday October 09 2020 02:54:59 EDT Ventricular Rate:  100 PR Interval:    QRS Duration: 99 QT  Interval:  357 QTC Calculation: 461 R Axis:   150 Text Interpretation: Sinus tachycardia Right axis deviation Low voltage, precordial leads No significant change since last tracing Confirmed by Addison Lank [phone]) on 10/09/2020 4:23:20 AM   Radiology DG Chest Portable 1 View  Result Date: 10/09/2020 CLINICAL DATA:  Chest pain, shortness of breath EXAM: PORTABLE CHEST 1 VIEW COMPARISON:  Radiograph 07/18/2020, CT 06/14/2020 FINDINGS: Chronic elevation of the right hemidiaphragm. Low volumes and atelectasis. Some mild  airways thickening is present as well with more hazy retrocardiac and medial right basilar opacity which could reflect developing airspace disease or further atelectatic change. No pneumothorax or visible effusion. Stable cardiomediastinal contours including some slight tortuosity of the right brachiocephalic vasculature which contributes to some medial right apical density stable from comparison. No acute osseous or soft tissue abnormality. Telemetry leads overlie the chest. IMPRESSION: 1. Low volumes and atelectasis. Chronic elevation of the right hemidiaphragm. 2. Mild airways thickening is present with more hazy retrocardiac and medial right basilar opacity which could reflect a developing infectious/inflammatory process versus further atelectatic change. Electronically Signed   By: Lovena Le M.D.   On: 10/09/2020 04:13    Procedures .Critical Care Performed by: Tedd Sias, PA Authorized by: Tedd Sias, PA   Critical care provider statement:    Critical care time (minutes):  35   Critical care time was exclusive of:  Separately billable procedures and treating other patients and teaching time   Critical care was necessary to treat or prevent imminent or life-threatening deterioration of the following conditions:  Metabolic crisis (Chest pain with severe hyperglycemia )   Critical care was time spent personally by me on the following activities:  Discussions with  consultants, evaluation of patient's response to treatment, examination of patient, review of old charts, re-evaluation of patient's condition, pulse oximetry, ordering and review of radiographic studies, ordering and review of laboratory studies and ordering and performing treatments and interventions   I assumed direction of critical care for this patient from another provider in my specialty: no     (including critical care time)  Medications Ordered in ED Medications  metoCLOPramide (REGLAN) injection 10 mg (has no administration in time range)  diphenhydrAMINE (BENADRYL) capsule 25 mg (has no administration in time range)  ondansetron (ZOFRAN) injection 4 mg (4 mg Intravenous Given 10/09/20 0438)  alum & mag hydroxide-simeth (MAALOX/MYLANTA) 200-200-20 MG/5ML suspension 30 mL (30 mLs Oral Given 10/09/20 0517)    And  lidocaine (XYLOCAINE) 2 % viscous mouth solution 15 mL (15 mLs Oral Given 10/09/20 0518)  insulin aspart (novoLOG) injection 8 Units (8 Units Intravenous Given 10/09/20 1324)    ED Course  I have reviewed the triage vital signs and the nursing notes.  Pertinent labs & imaging results that were available during my care of the patient were reviewed by me and considered in my medical decision making (see chart for details).    MDM Rules/Calculators/A&P                          Patient is a 44 year old female past medical history significant for poorly controlled diabetes, severe obesity, obesity hypoventilation syndrome, depression, OSA, hypertension, asthma HLD, chronic pain, leg syndrome, left BKA and right midfoot amputation.  Patient is presented today with chest pain that is rather atypical presentation for ACS consist of sharp well localized nonexertional nonpleuritic sternal chest pain that is nonradiating.  Her symptoms began at 1:30 AM this morning and is been constant since.  No waxing or waning. She was some shortness of breath with this as well. Also endorses  nausea without emesis.  No abdominal pain.  Patient's physical exam is notable for morbidly obese 44 year old female who appears somewhat older than stated age with left BKA and right midfoot amputation.  She has sizable pannus with some mild erythema underneath with no evidence of infection-most consistent with some intertriginous changes. Lungs are clear to auscultation all fields.  Very mild tachycardia initially which has not persisted.  SPO2 ranges between 92% and 100% on room air.  She is not appear overly dyspneic or tachypneic.  She has no significant increased work of breathing she is crying on initial exam.  Laboratory evaluation CBC with very mild leukocytosis 11.6 no anemia CMP with glucose of 524, sodium of 129 corrects to 136 with correction for hyperglycemia.  BUN and creatinine elevated appear at baseline for patient. Lipase within normal limits hepatitis Dimer has been negative previously is negative today.  Low suspicion for pulmonary embolism. Troponin x1 at 20 delta pending BNP at 41.5 patient does not appear fluid overloaded  7:01 AM discussed with family medicine residency hospitalist.  Will assess patient at bedside and admit to hospital. Covid test is pending at this time.   Final Clinical Impression(s) / ED Diagnoses Final diagnoses:  Nonspecific chest pain  Elevated troponin    Rx / DC Orders ED Discharge Orders    None       Tedd Sias, [place] 10/09/20 [id number]    Fatima Blank, MD 10/10/20 [phone]

## 2020-10-09 NOTE — Telephone Encounter (Signed)
LMOVM for pt to call us back. Brahim Dolman, CMA  

## 2020-10-10 DIAGNOSIS — G4733 Obstructive sleep apnea (adult) (pediatric): Secondary | ICD-10-CM | POA: Diagnosis not present

## 2020-10-10 DIAGNOSIS — I10 Essential (primary) hypertension: Secondary | ICD-10-CM | POA: Diagnosis not present

## 2020-10-10 DIAGNOSIS — Z87891 Personal history of nicotine dependence: Secondary | ICD-10-CM | POA: Diagnosis not present

## 2020-10-10 DIAGNOSIS — Z20822 Contact with and (suspected) exposure to covid-19: Secondary | ICD-10-CM | POA: Diagnosis not present

## 2020-10-10 DIAGNOSIS — Z79899 Other long term (current) drug therapy: Secondary | ICD-10-CM | POA: Diagnosis not present

## 2020-10-10 DIAGNOSIS — J45909 Unspecified asthma, uncomplicated: Secondary | ICD-10-CM | POA: Diagnosis not present

## 2020-10-10 DIAGNOSIS — Z794 Long term (current) use of insulin: Secondary | ICD-10-CM | POA: Diagnosis not present

## 2020-10-10 DIAGNOSIS — Z9989 Dependence on other enabling machines and devices: Secondary | ICD-10-CM | POA: Diagnosis not present

## 2020-10-10 DIAGNOSIS — R079 Chest pain, unspecified: Secondary | ICD-10-CM | POA: Diagnosis not present

## 2020-10-10 DIAGNOSIS — R7989 Other specified abnormal findings of blood chemistry: Secondary | ICD-10-CM | POA: Diagnosis not present

## 2020-10-10 DIAGNOSIS — J449 Chronic obstructive pulmonary disease, unspecified: Secondary | ICD-10-CM | POA: Diagnosis not present

## 2020-10-10 DIAGNOSIS — R0789 Other chest pain: Secondary | ICD-10-CM | POA: Diagnosis not present

## 2020-10-10 DIAGNOSIS — E1165 Type 2 diabetes mellitus with hyperglycemia: Secondary | ICD-10-CM | POA: Diagnosis not present

## 2020-10-10 LAB — CBC
HCT: 36.2 % (ref 36.0–46.0)
Hemoglobin: 11.5 g/dL — ABNORMAL LOW (ref 12.0–15.0)
MCH: 29.3 pg (ref 26.0–34.0)
MCHC: 31.8 g/dL (ref 30.0–36.0)
MCV: 92.3 fL (ref 80.0–100.0)
Platelets: 331 10*3/uL (ref 150–400)
RBC: 3.92 MIL/uL (ref 3.87–5.11)
RDW: 13.6 % (ref 11.5–15.5)
WBC: 10.5 10*3/uL (ref 4.0–10.5)
nRBC: 0 % (ref 0.0–0.2)

## 2020-10-10 LAB — BASIC METABOLIC PANEL
Anion gap: 10 (ref 5–15)
BUN: 27 mg/dL — ABNORMAL HIGH (ref 6–20)
CO2: 25 mmol/L (ref 22–32)
Calcium: 8.7 mg/dL — ABNORMAL LOW (ref 8.9–10.3)
Chloride: 99 mmol/L (ref 98–111)
Creatinine, Ser: 1.14 mg/dL — ABNORMAL HIGH (ref 0.44–1.00)
GFR, Estimated: >60 mL/min (ref >60)
Glucose, Bld: 359 mg/dL — ABNORMAL HIGH (ref 70–99)
Potassium: 4.2 mmol/L (ref 3.5–5.1)
Sodium: 134 mmol/L — ABNORMAL LOW (ref 135–145)

## 2020-10-10 LAB — HEMOGLOBIN A1C
Hgb A1c MFr Bld: >15.5 % — ABNORMAL HIGH (ref 4.8–5.6)
Mean Plasma Glucose: >398 mg/dL

## 2020-10-10 LAB — GLUCOSE, CAPILLARY
Glucose-Capillary: 323 mg/dL — ABNORMAL HIGH (ref 70–99)
Glucose-Capillary: 340 mg/dL — ABNORMAL HIGH (ref 70–99)

## 2020-10-10 MED ORDER — ONDANSETRON HCL 4 MG/2ML IJ SOLN
4.0000 mg | Freq: Three times a day (TID) | INTRAMUSCULAR | Status: DC | PRN
  Administered 2020-10-10: 4 mg via INTRAVENOUS
  Filled 2020-10-10: qty 2

## 2020-10-10 MED ORDER — COLLAGENASE 250 UNIT/GM EX OINT
TOPICAL_OINTMENT | Freq: Every day | CUTANEOUS | Status: DC
  Filled 2020-10-10: qty 30

## 2020-10-10 NOTE — Discharge Summary (Addendum)
[facility] Discharge Summary  Patient name: [name] Medical record number: [id number] Date of birth: 12/08/1975 Age: 44 y.o. Gender: female Date of Admission: 10/09/2020  Date of Discharge: 10/10/2020 Admitting Physician: [name], MD  Primary Care Provider: [name], MD Consultants: Cardiology  Indication for Hospitalization: Chest Pain  Discharge Diagnoses/Problem List:  Non-cardiac chest pain Hyperglycemia Poorly controlled T2DM Chronic HFpEF HTN HLD Hx of SVT OHS OSA Asthma Bipolar Disorder with depression Chronic Pain Syndrome Restless Leg Syndrome GERD  Disposition: Home  Discharge Condition: Improved, stable  Discharge Exam:  Gen: alert, non-toxic appearing, NAD HEENT: NCAT Chest: nontender to palpation CV: RRR, normal S1/S2 without m/r/g Resp: normal WOB, lungs CTAB Abd: soft, nontender Ext: s/p L BKA and R transmetatarsal amputation Neuro: grossly intact  Brief Hospital Course:   [name] is a 44 y.o. female who presented with central chest pain . PMH is significant for T2DM, OSA, OHS, HTN, HLD, HFpEF, morbid obesity, GERD, history of left BKA, bipolar disorder with depression, asthma, restless leg syndrome, and chronic pain syndrome.  Central Chest Pain Patient initially presented with sudden onset central chest pain that woke her from sleep. Workup in the ED included troponin (20>17), EKG (unchanged from prior), BNP wnl, and CXR with atelectasis but no pneumonia. Cardiology was consulted and felt patient's chest pain was non cardiac in origin. She was observed overnight on telemetry and by the next morning her pain had resolved completely. Ultimately thought to be musculoskeletal in origin given reproducible nature, or  stress-induced as she had an argument with friend prior to admission.  Hyperglycemia, Poorly Controlled T2DM Patient's CBG on arrival was 507. Her sugars remained persistently  elevated throughout her admission in the 300s-400s. A1c found to be >15%. Her home 70/30 and Victoza were held and she was treated with sliding scale insulin while admitted.  HTN Patient's BP was low-normal on presentation so her home Norvasc was held. Her BP subsequently increased to 710G-269S systolic, but patient reportedly not taking any BP medications at home so no medication was administered. Patient instructed to follow up with PCP re: BP medication.  The remainder of patient's chronic problems remained stable while admitted and she was maintained on her home medications.  Issues for Follow Up:  Poorly controlled T2DM: referral to outpatient endocrinology [facility]) has been processed, but patient has not heard from them. Recommend patient call [facility] at [phone] to schedule appointment. HTN: patient reportedly not taking any BP medication at home (previously on Norvasc 10mg  daily and metoprolol). Would assess need for anti-hypertensive.    Significant Procedures: None  Significant Labs and Imaging:  Recent Labs  Lab 10/09/20 0254 10/10/20 0344  WBC 11.6* 10.5  HGB 12.0 11.5*  HCT 38.0 36.2  PLT 356 331   Recent Labs  Lab 10/09/20 0254 10/09/20 0254 10/09/20 1444 10/10/20 0344  NA 129*  --  133* 134*  K 4.8   < > 4.7 4.2  CL 91*  --  97* 99  CO2 25  --  25 25  GLUCOSE 524*  --  423* 359*  BUN 26*  --  30* 27*  CREATININE 1.20*  --  1.13* 1.14*  CALCIUM 8.6*  --  8.4* 8.7*  ALKPHOS 121  --   --   --   AST 16  --   --   --   ALT 21  --   --   --   ALBUMIN 2.5*  --   --   --    < > =  values in this interval not displayed.    Results for [name] (MRN [id number]) as of 10/10/2020 16:03  Ref. Range 10/09/2020 14:44  Hemoglobin A1C Latest Ref Range: 4.8 - 5.6 % >15.5 (H)    Results/Tests Pending at Time of Discharge: None  Discharge Medications:  Allergies as of 10/10/2020       Reactions   Cefepime Other (See Comments)   AKI, see  records from [facility] hospitalization in January 2020.     Kiwi Extract Shortness Of Breath, Swelling   Morphine And Related Nausea And Vomiting   Trental [pentoxifylline] Nausea And Vomiting   Nubain [nalbuphine Hcl] Other (See Comments)   "FEELS LIKE SOMETHING CRAWLING ON ME"        Medication List     STOP taking these medications    amLODipine 10 MG tablet Commonly known as: NORVASC   metoprolol succinate 25 MG 24 hr tablet Commonly known as: Toprol XL       TAKE these medications    Accu-Chek Guide test strip Generic drug: glucose blood USE T0 CHECK BLOOD SUGAR 3 TIMES DAILY   Accu-Chek Softclix Lancets lancets Use to check blood glucose four times daily   accu-chek multiclix lancets 1 each by Other route as needed for other. Use as instructed   atorvastatin 40 MG tablet Commonly known as: LIPITOR Take 1 tablet (40 mg total) by mouth daily.   budesonide-formoterol 160-4.5 MCG/ACT inhaler Commonly known as: Symbicort Inhale 2 puffs into the lungs 2 (two) times daily.   Cholecalciferol 25 MCG (1000 UT) tablet Take 1 tablet (1,000 Units total) by mouth daily.   Compression Bandages Misc Wear daily for swelling   cyclobenzaprine 5 MG tablet Commonly known as: FLEXERIL Take 5 mg by mouth 3 (three) times daily as needed.   Dexcom G6 Sensor Misc Inject 1 applicator into the skin as directed. Change sensor every 10 days.   Dexcom G6 Transmitter Misc Inject 1 Device into the skin as directed. Reuse 8 times with sensor changes.   gabapentin 600 MG tablet Commonly known as: NEURONTIN TAKE 2 TABLETS BY MOUTH 3 TIMES DAILY What changed: See the new instructions.   HYDROcodone-acetaminophen 10-325 MG tablet Commonly known as: NORCO TAKE 1 TABLET BY MOUTH EVERY EIGHT HOURS AS NEEDED What changed:  how much to take how to take this when to take this reasons to take this additional instructions   insulin isophane & regular human (70-30) 100 UNIT/ML  KwikPen Commonly known as: HUMULIN 70/30 MIX Inject 110 Units into the skin daily with breakfast AND 90 Units daily with supper.   ipratropium-albuterol 0.5-2.5 (3) MG/3ML Soln Commonly known as: DUONEB Take 3 mLs by nebulization every 4 (four) hours as needed.   Misc. Devices Misc Shrinkers and liners for prosthesis   Pulse Oximeter For Finger Misc Use as needed to check pulse oxymetry   potassium chloride SA 20 MEQ tablet Commonly known as: Klor-Con M20 Take 1 tablet (20 mEq total) by mouth daily.   rOPINIRole 1 MG tablet Commonly known as: REQUIP Take 1 tablet (1 mg total) by mouth at bedtime.   Sure Comfort Pen Needles 32G X 4 MM Misc Generic drug: Insulin Pen Needle Use to inject insulin as indicated   torsemide 20 MG tablet Commonly known as: DEMADEX Take 2 tablets (40 mg total) by mouth daily.   Victoza 18 MG/3ML Sopn Generic drug: liraglutide inject 0.6 MG SUBCUTANEOUSLY ONCE A DAY FOR 1 WEEK, THEN INCREASE TO 1.2 MG ONCE daily max  1.8 MG What changed: See the new instructions.               Discharge Care Instructions  (From admission, onward)           Start     Ordered   10/10/20 0000  Discharge wound care:       Comments: Per wound center   10/10/20 1112            Discharge Instructions: Please refer to Patient Instructions section of EMR for full details.  Patient was counseled important signs and symptoms that should prompt return to medical care, changes in medications, dietary instructions, activity restrictions, and follow up appointments.   Follow-Up Appointments:    November 16th at 9:10am with Dr. [name] (PCP) November 17th at 11:20am with Dr. Burt Knack (Cardiology)  Alcus Dad, MD 10/10/2020, 3:16 PM PGY-1, [place]

## 2020-10-10 NOTE — Progress Notes (Addendum)
Progress Note  Patient Name: [name] Date of Encounter: 10/10/2020  [facility] Cardiologist: Sherren Mocha, MD   Subjective   Pt was nauseous this morning because she had not eaten enough yesterday - only a [place] sandwich and chicken sandwich. She is feeling much better now. No further chest pain. Wishes to discharge home.  Inpatient Medications    Scheduled Meds: . atorvastatin  40 mg Oral Daily  . enoxaparin (LOVENOX) injection  80 mg Subcutaneous Q24H  . gabapentin  1,200 mg Oral TID  . insulin aspart  0-15 Units Subcutaneous Q4H  . mometasone-formoterol  2 puff Inhalation BID  . pantoprazole  40 mg Oral Daily  . rOPINIRole  1 mg Oral QHS   Continuous Infusions:  PRN Meds: HYDROcodone-acetaminophen, ipratropium-albuterol, ondansetron (ZOFRAN) IV   Vital Signs    Vitals:   10/09/20 2100 10/09/20 2332 10/10/20 0303 10/10/20 0401  BP:  130/77  128/68  Pulse: 95 90 94 88  Resp: 19 18 (!) 21 18  Temp:  98 F (36.7 C)  98 F (36.7 C)  TempSrc:  Oral  Oral  SpO2: 100% 94% 95% 94%  Weight:      Height:        Intake/Output Summary (Last 24 hours) at 10/10/2020 0654 Last data filed at 10/09/2020 2141 Gross per 24 hour  Intake --  Output 2000 ml  Net -2000 ml   Last 3 Weights 10/09/2020 09/09/2020 08/28/2020  Weight (lbs) 362 lb 362 lb 362 lb  Weight (kg) 164.202 kg 164.202 kg 164.202 kg  Some encounter information is confidential and restricted. Go to Review Flowsheets activity to see all data.      Telemetry    Sinus rhythm to sinus tachycardia with HR 80-100s - Personally Reviewed  ECG    Sinus rhythm HR 90, artifact - Personally Reviewed  Physical Exam   GEN: obese female in no acute distress.   Neck: No JVD Cardiac: RRR, no murmurs, rubs, or gallops.  Respiratory: Clear to auscultation bilaterally. GI: Soft, nontender, non-distended  MS: No edema;  Neuro:  Nonfocal  Psych: Normal affect   Labs    High Sensitivity Troponin:   Recent  Labs  Lab 10/09/20 0254 10/09/20 0550  TROPONINIHS 20* 17      Chemistry Recent Labs  Lab 10/09/20 0254 10/09/20 1444 10/10/20 0344  NA 129* 133* 134*  K 4.8 4.7 4.2  CL 91* 97* 99  CO2 25 25 25   GLUCOSE 524* 423* 359*  BUN 26* 30* 27*  CREATININE 1.20* 1.13* 1.14*  CALCIUM 8.6* 8.4* 8.7*  PROT 6.5  --   --   ALBUMIN 2.5*  --   --   AST 16  --   --   ALT 21  --   --   ALKPHOS 121  --   --   BILITOT 0.4  --   --   GFRNONAA 58* >60 >60  ANIONGAP 13 11 10      Hematology Recent Labs  Lab 10/09/20 0254 10/10/20 0344  WBC 11.6* 10.5  RBC 4.09 3.92  HGB 12.0 11.5*  HCT 38.0 36.2  MCV 92.9 92.3  MCH 29.3 29.3  MCHC 31.6 31.8  RDW 13.8 13.6  PLT 356 331    BNP Recent Labs  Lab 10/09/20 0254  BNP 41.5     DDimer  Recent Labs  Lab 10/09/20 0254  DDIMER 0.49     Radiology    DG Chest Portable 1 View  Result Date:  10/09/2020 CLINICAL DATA:  Chest pain, shortness of breath EXAM: PORTABLE CHEST 1 VIEW COMPARISON:  Radiograph 07/18/2020, CT 06/14/2020 FINDINGS: Chronic elevation of the right hemidiaphragm. Low volumes and atelectasis. Some mild airways thickening is present as well with more hazy retrocardiac and medial right basilar opacity which could reflect developing airspace disease or further atelectatic change. No pneumothorax or visible effusion. Stable cardiomediastinal contours including some slight tortuosity of the right brachiocephalic vasculature which contributes to some medial right apical density stable from comparison. No acute osseous or soft tissue abnormality. Telemetry leads overlie the chest. IMPRESSION: 1. Low volumes and atelectasis. Chronic elevation of the right hemidiaphragm. 2. Mild airways thickening is present with more hazy retrocardiac and medial right basilar opacity which could reflect a developing infectious/inflammatory process versus further atelectatic change. Electronically Signed   By: Lovena Le M.D.   On: 10/09/2020 04:13      Cardiac Studies   Nuclear stress test 05/13/20:  Nuclear stress EF: 56%.  There was no ST segment deviation noted during stress.  The study is normal.  This is a low risk study.  The left ventricular ejection fraction is normal (55-65%).   Normal pharmacologic nuclear study with no evidence for prior infarct or ischemia. Normal LVEF.    Echo 06/15/20: 1. Limited study quality but there is normal left ventricular systolic  and diastolic function, right ventricle is poorly visualized.  2. Left ventricular ejection fraction, by estimation, is 55 to 60%. The  left ventricle has normal function. The left ventricle has no regional  wall motion abnormalities. Left ventricular diastolic parameters were  normal.  3. Right ventricular systolic function was not well visualized. The right  ventricular size is normal.  4. The mitral valve is normal in structure. No evidence of mitral valve  regurgitation. No evidence of mitral stenosis.  5. The aortic valve is normal in structure. Aortic valve regurgitation is  not visualized. No aortic stenosis is present.  6. The inferior vena cava is normal in size with greater than 50%  respiratory variability, suggesting right atrial pressure of 3 mmHg.   Patient Profile     44 y.o. female with a PMH significant for morbid obesity, DM, OSA, HTN, HLD, left BKA, and prior noncardiac pain in the setting of a nonischemic nuclear stress test. She presented to [facility] with CP.  Assessment & Plan    Chest pain - reassuring nuclear stress test June 2021 - hs troponin 20 --> 17 - D-dimer negative - no further chest pain   Nausea this morning - no vomiting - states this was due to not eating enough yesterday   DM2 - uncontrolled - A1c 12.7% - down from 17.5% - per primary    Hyperlipidemia - continue statin   Chronic diastolic heart failure - BNP normal - volume status challenging due to body habitus - continue home PO  demadex   [facility] will sign off.   Medication Recommendations:  As in mar  Other recommendations (labs, testing, etc):  none Follow up as an outpatient:  2-4 weeks, we will arrange cardiology follow up   Pt may be discharged from a cardiology standpoint after she is seen by Dr. Burt Knack.   For questions or updates, please contact [place] Please consult [email or web address] for contact info under        Signed, Ledora Bottcher, PA  10/10/2020, 6:54 AM    Patient seen, examined. Available data reviewed. Agree with findings, assessment, and plan as outlined by  Angie Duke, PA-C.  The patient is independently interviewed and examined.  She is in no distress, sitting up at the bedside.  The patient is morbidly obese.  JVP: Unable to see.  Lungs are clear, heart is regular rate and rhythm with a 2/6 systolic murmur along the left sternal border.  Abdomen is soft, obese, and nontender.  Extremity exam pertinent for lower extremity amputations (right midfoot and left BKA).  Troponin trend of 20-17.  Suspect noncardiac chest pain.  The patient has longstanding chronic chest pain.  She has had a normal heart catheterization in the past.  She does not need any further cardiac evaluation at this time.  She is again counseled about her uncontrolled diabetes and she tells me that she is working with her outpatient physician in order to improve glycemic control.  Cardiology follow-up as above.  Sherren Mocha, M.D. 10/10/2020 11:07 AM

## 2020-10-10 NOTE — Progress Notes (Signed)
Inpatient Diabetes Program Recommendations  AACE/ADA: New Consensus Statement on Inpatient Glycemic Control (2015)  Target Ranges:  Prepandial:   less than 140 mg/dL      Peak postprandial:   less than 180 mg/dL (1-2 hours)      Critically ill patients:  140 - 180 mg/dL   Lab Results  Component Value Date   GLUCAP 340 (H) 10/10/2020   HGBA1C 12.7 (H) 06/14/2020    Review of Glycemic Control  Current orders for Inpatient glycemic control:  Novolog 0-15 units Q4H  CBGs - 323, 340 mg/dL Needs basal insulin Eating 100% this am.   Inpatient Diabetes Program Recommendations:     Begin 70/30 55 units at breakfast and 45 units in pm before dinner Continue Novolog 0-15 units - change to tidwc and 0-5 units QHS.  Pt has been counseled regarding her diabetes and HgbA1C by several Coordinators.   May benefit from OP Diabetes Education consult. Needs to maximize lifestyle modification with weight loss and exercise.  Continue to follow.  Thank you. Lorenda Peck, RD, LDN, CDE Inpatient Diabetes Coordinator [phone]

## 2020-10-10 NOTE — ED Provider Notes (Signed)
Attestation: Medical screening examination/treatment/procedure(s) were conducted as a shared visit with non-physician practitioner(s) and myself.  I personally evaluated the patient during the encounter.   Briefly, the patient is a 44 y.o. female with h/o poorly controlled DM 2, morbid obesity, obesity hypoventilation syndrome, depression, obstructive sleep apnea, hypertension, asthma, HLD, chronic pain, restless leg syndrome, left BKA, right midfoot amputation, here for chest pain.   Vitals:   10/09/20 2100 10/09/20 2332  BP:  130/77  Pulse: 95 90  Resp: 19 18  Temp:  98 F (36.7 C)  SpO2: 100% 94%    CONSTITUTIONAL:  Chronically ill, but nontoxic-appearing, NAD NEURO:  Alert and oriented x 3, no focal deficits EYES:  pupils equal and reactive ENT/NECK:  trachea midline, no JVD CARDIO:  reg rate, reg rhythm, well-perfused PULM:  None labored breathing GI/GU:  Abdomen non-distended MSK/SPINE:   left BKA, right midfoot amputation, no edema SKIN:  no rash, atraumatic PSYCH:  Appropriate speech and behavior   EKG Interpretation  Date/Time:  Thursday October 09 2020 02:54:59 EDT Ventricular Rate:  100 PR Interval:    QRS Duration: 99 QT Interval:  357 QTC Calculation: 461 R Axis:   150 Text Interpretation: Sinus tachycardia Right axis deviation Low voltage, precordial leads No significant change since last tracing Confirmed by Addison Lank [phone]) on 10/09/2020 4:23:20 AM       EKG w/o acute changes. Initial trop mildly elevated. Doubt PE, dissection. Will admit to trend trops  Also noted to have hyperglycemia w/o DKA. Insulin and IVF given.  FM admission     Fatima Blank, MD 10/10/20 [phone]

## 2020-10-10 NOTE — Progress Notes (Signed)
Interim Progress note.  Got a page from NP that Pt is c/o nausea. Denies any vomiting. Vitals normal. Ordered Zofran 4 mg Q8h PRN. Nurse informed.  Dr. Armando Reichert MD PGY1 [place]

## 2020-10-10 NOTE — Discharge Instructions (Signed)
It was a pleasure taking care of you!  You were admitted to the hospital with chest pain. We checked several labs, an EKG and a chest x-ray while you were here. The cardiology team evaluated you and did not think your chest pain was related to your heart. It may have been a muscular problem.  Your sugars were high while you were admitted to the hospital, so we treated you with insulin. Please continue your home diabetes medications and follow up with Dr. [name] next week as scheduled.

## 2020-10-10 NOTE — Progress Notes (Signed)
Family Medicine Teaching Service Daily Progress Note Intern Pager: [phone]  Patient name: [name] Medical record number: [id number] Date of birth: 1976/01/16 Age: 44 y.o. Gender: female  Primary Care Provider: [name], MD Consultants: Cardiology Code Status: FULL  Pt Overview and Major Events to Date:  11/4: admitted, cardiology consulted   Assessment and Plan:  [name] is a 44 y.o. female presenting with central chest pain . PMH is significant for T2DM, OSA, OHS, HTN, HLD, HFpEF, morbid obesity, GERD, history of left BKA, bipolar disorder with depression, asthma, restless leg syndrome, chronic pain syndrome.  Uncontrolled T2DM with Hyperglycemia Fasting glucose 340 this morning. Glucose elevated to 400s overnight. She was given 46 units Novolog overnight (20, then 15, then 11). Her home regimen is 70/30 100 units with breakfast and 90u with dinner, Victoza 1.2mg  weekly. Hemoglobin A1c >15.  -CBGs q6h -Holding home victoza and 70/30 -mSSI -Outpatient PCP and endocrine follow-up  Non-cardiac Chest Pain Her chest pain has resolved completely. Cardiology saw patient and deemed pain to be noncardiac. Thought to be musculoskeletal or stress induced (she had argument with friend) -Outpatient routine cardiology follow up  Chronic HFpEF Not in acute exacerbation. Last echo July 2021 EF 55-60%. On Torsemide 40mg  daily at home. -Continue home Torsemide   HTN BP wnl over past 12hours. Most recently 128/68. Reportedly on Norvasc 10mg  daily at home, although unclear if patient is taking this. -holding home Norvasc given normal Bps -outpatient f/u with PCP  HLD Chronic, stable. -Continue home Lipitor 40mg  daily  OHS  OSA  Asthma Chronic, stable. Followed outpatient by Pulmonology. -Continue home meds (BiPAP qhs, duonebs prn, and Dulera in place of home symbicort)  Chronic Pain Syndrome On Norco 10-325mg  q8h prn at home, Flexeril TID prn, Gabapentin 1200mg   TID. -Continue home Norco and Gabapentin -Holding home Flexeril  Restless Leg Syndrome Chronic, stable. -Continue home ropinirole 1mg  nightly  GERD Chronic, stable. On Pantoprazole 40mg  daily at home. -Continue home pantoprazole   FEN/GI: heart healthy, carb modified PPx: Lovenox   Status is: Observation The patient remains OBS appropriate and will d/c before 2 midnights.  Dispo: The patient is from: Home              Anticipated d/c is to: Home              Anticipated d/c date is: 1 day              Patient currently is medically stable to d/c.   Subjective:  Patient has no complaints and wants to go home. States her chest pain has resolved completely. She did have some nausea early this morning and patient states that's because she hadn't eaten. No further nausea.  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   Laboratory: Recent Labs  Lab 10/09/20 0254 10/10/20 0344  WBC 11.6* 10.5  HGB 12.0 11.5*  HCT 38.0 36.2  PLT 356 331   Recent Labs  Lab 10/09/20 0254 10/09/20 1444 10/10/20 0344  NA 129* 133* 134*  K 4.8 4.7 4.2  CL 91* 97*  99  CO2 25 25 25   BUN 26* 30* 27*  CREATININE 1.20* 1.13* 1.14*  CALCIUM 8.6* 8.4* 8.7*  PROT 6.5  --   --   BILITOT 0.4  --   --   ALKPHOS 121  --   --   ALT 21  --   --   AST 16  --   --   GLUCOSE 524* 423* 359*    Imaging/Diagnostic Tests: No new imaging/diagnostic tests  Belinda Dad, MD 10/10/2020, 7:13 AM PGY-1, [place] Intern  pager: [phone], text pages welcome

## 2020-10-10 NOTE — Progress Notes (Signed)
Pt IV removed, catheter intact. Tele removed, ccmd aware. Pt understands d/c instructions. Pt has all belongings. Pt d/c via NT.

## 2020-10-10 NOTE — Consult Note (Addendum)
[place] Nurse Consult Note: Patient care given in [place] Patient is followed by the wound clinic.  Continuing treatment started.  Reason for Consult: Right foot wound Wound type: Stump surgical wound Measurement: 1 cm x 0.5 cm Wound bed: Pink and yellow Drainage (amount, consistency, odor) Scant serous drainage Periwound: Intact Dressing procedure/placement/frequency: Apply santyl to right foot stump. Cover with small foam dressing. Change daily.  Pt should resume follow-up with the outpatient wound care center after discharge Monitor the wound area(s) for worsening of condition such as: Signs/symptoms of infection, increase in size, development of or worsening of odor, development of pain, or increased pain at the affected locations.   Notify the medical team if any of these develop.  Thank you for the consult. [place] nurse will not follow at this time.   Please re-consult the [place] team if needed.  Cathlean Marseilles Tamala Julian, MSN, RN, [place], Lysle Pearl, [facility] Wound Treatment Associate Pager [phone]

## 2020-10-13 DIAGNOSIS — R079 Chest pain, unspecified: Secondary | ICD-10-CM | POA: Diagnosis not present

## 2020-10-13 DIAGNOSIS — R0789 Other chest pain: Secondary | ICD-10-CM | POA: Diagnosis not present

## 2020-10-13 DIAGNOSIS — E1165 Type 2 diabetes mellitus with hyperglycemia: Secondary | ICD-10-CM | POA: Diagnosis not present

## 2020-10-13 DIAGNOSIS — I10 Essential (primary) hypertension: Secondary | ICD-10-CM | POA: Diagnosis not present

## 2020-10-14 ENCOUNTER — Other Ambulatory Visit: Payer: Self-pay

## 2020-10-14 ENCOUNTER — Encounter (HOSPITAL_BASED_OUTPATIENT_CLINIC_OR_DEPARTMENT_OTHER): Payer: Medicaid Other | Admitting: Internal Medicine

## 2020-10-14 DIAGNOSIS — E11621 Type 2 diabetes mellitus with foot ulcer: Secondary | ICD-10-CM | POA: Diagnosis not present

## 2020-10-14 DIAGNOSIS — E1151 Type 2 diabetes mellitus with diabetic peripheral angiopathy without gangrene: Secondary | ICD-10-CM | POA: Diagnosis not present

## 2020-10-14 DIAGNOSIS — L97512 Non-pressure chronic ulcer of other part of right foot with fat layer exposed: Secondary | ICD-10-CM | POA: Diagnosis not present

## 2020-10-15 ENCOUNTER — Other Ambulatory Visit: Payer: Self-pay | Admitting: Family Medicine

## 2020-10-16 ENCOUNTER — Other Ambulatory Visit: Payer: Self-pay

## 2020-10-16 ENCOUNTER — Ambulatory Visit (INDEPENDENT_AMBULATORY_CARE_PROVIDER_SITE_OTHER): Payer: Medicaid Other | Admitting: Pharmacist

## 2020-10-16 ENCOUNTER — Encounter: Payer: Self-pay | Admitting: Pharmacist

## 2020-10-16 VITALS — BP 142/80 | HR 101

## 2020-10-16 DIAGNOSIS — IMO0002 Reserved for concepts with insufficient information to code with codable children: Secondary | ICD-10-CM

## 2020-10-16 DIAGNOSIS — E1151 Type 2 diabetes mellitus with diabetic peripheral angiopathy without gangrene: Secondary | ICD-10-CM | POA: Diagnosis not present

## 2020-10-16 DIAGNOSIS — E1165 Type 2 diabetes mellitus with hyperglycemia: Secondary | ICD-10-CM | POA: Diagnosis not present

## 2020-10-16 MED ORDER — INSULIN ASPART 100 UNIT/ML FLEXPEN: 20.0000 [IU] | PEN_INJECTOR | Freq: Three times a day (TID) | SUBCUTANEOUS | 11 refills | Status: DC

## 2020-10-16 MED ORDER — LANTUS SOLOSTAR 100 UNIT/ML ~~LOC~~ SOPN: 70.0000 [IU] | PEN_INJECTOR | Freq: Two times a day (BID) | SUBCUTANEOUS | 11 refills | Status: DC

## 2020-10-16 MED ORDER — DEXCOM G6 SENSOR MISC: 1.0000 | 11 refills | Status: DC

## 2020-10-16 MED ORDER — DEXCOM G6 TRANSMITTER MISC: 1.0000 | 3 refills | Status: DC

## 2020-10-16 NOTE — Assessment & Plan Note (Signed)
Diabetes currently uncontrolled with last A1c > 7%. Medication adherence appears fair. Home sugars remain elevated despite current diabetes regimen. Plan to adjust insulin regimen due to suboptimal control with Humulin 70/30 mix. Additionally, patient is a good candidate for a CGM device as she check sugars 3-4 times a day and is now on multiple insulin injections per day.  -Started basal insulin Lantus 70 units twice daily.  -Started rapid insulin Novolog 20 units three times daily with meals  -Discontinued Humulin 70/30 mix insulin -Continued Victoza 1.2 mg minus 6 clicks daily.  -Sent prescription for Dexcom CGM device -Extensively discussed pathophysiology of diabetes, recommended lifestyle interventions, dietary effects on blood sugar control

## 2020-10-16 NOTE — Progress Notes (Signed)
[name], [name] ([id number]) Visit Report for 10/14/2020 Arrival Information Details Patient Name: Date of Service: [name] [facility] 10/14/2020 10:15 A M Medical Record Number: [id number] Patient Account Number: [id number] Date of Birth/Sex: Treating RN: January 03, 1976 (44 y.o. [name] Primary Care Maurio Baize: [name] Other Clinician: Referring Alayne Estrella: Treating Carol Loftin/Extender: Mammie Russian, Ivan Anchors in Treatment: 3 Visit Information History Since Last Visit Added or deleted any medications: No Patient Arrived: Wheel Chair Any new allergies or adverse reactions: No Arrival Time: 10:35 Had a fall or experienced change in No Accompanied By: family member activities of daily living that may affect Transfer Assistance: None risk of falls: Patient Identification Verified: Yes Signs or symptoms of abuse/neglect since last visito No Secondary Verification Process Completed: Yes Hospitalized since last visit: Yes Patient Requires Transmission-Based Precautions: No Implantable device outside of the clinic excluding No Patient Has Alerts: No cellular tissue based products placed in the center since last visit: Has Dressing in Place as Prescribed: Yes Pain Present Now: Yes Electronic Signature(s) Signed: 10/16/2020 8:28:44 AM By: Levan Hurst RN, BSN Entered By: Levan Hurst on 10/14/2020 10:39:35 -------------------------------------------------------------------------------- Encounter Discharge Information Details Patient Name: Date of Service: [name], Belinda [facility] 10/14/2020 10:15 A M Medical Record Number: [id number] Patient Account Number: [id number] Date of Birth/Sex: Treating RN: 1976/02/24 (44 y.o. [name] Primary Care Olevia Westervelt: [name] Other Clinician: Referring Kellsey Sansone: Treating Notnamed Croucher/Extender: Mammie Russian, Ivan Anchors in Treatment: 3 Encounter Discharge Information Items Post Procedure Vitals Discharge  Condition: Stable Temperature (F): 98.3 Ambulatory Status: Wheelchair Pulse (bpm): 101 Discharge Destination: Home Respiratory Rate (breaths/min): 18 Transportation: Private Auto Blood Pressure (mmHg): 146/63 Accompanied By: friend Schedule Follow-up Appointment: Yes Clinical Summary of Care: Electronic Signature(s) Signed: 10/14/2020 5:43:21 PM By: Deon Pilling Entered By: Deon Pilling on 10/14/2020 11:51:19 -------------------------------------------------------------------------------- Lower Extremity Assessment Details Patient Name: Date of Service: [name] [facility] 10/14/2020 10:15 A M Medical Record Number: [id number] Patient Account Number: [id number] Date of Birth/Sex: Treating RN: 07-25-1976 (44 y.o. [name] Primary Care Hommer Cunliffe: [name] Other Clinician: Referring Odin Mariani: Treating Tishara Pizano/Extender: Mammie Russian, Ivan Anchors in Treatment: 3 Edema Assessment Assessed: Shirlyn Goltz: No] [Right: No] Edema: [Left: IE] [Right: s] Vascular Assessment Pulses: Dorsalis Pedis Palpable: [Right:Yes] Electronic Signature(s) Signed: 10/16/2020 8:28:44 AM By: Levan Hurst RN, BSN Entered By: Levan Hurst on 10/14/2020 10:45:23 -------------------------------------------------------------------------------- Multi Wound Chart Details Patient Name: Date of Service: [name], [name] 10/14/2020 10:15 A M Medical Record Number: [id number] Patient Account Number: [id number] Date of Birth/Sex: Treating RN: [birth year] (44 y.o. Orvan Falconer Primary Care Britt Petroni: [name] Other Clinician: Referring Cleave Ternes: Treating Shalonda Sachse/Extender: Mammie Russian, Ivan Anchors in Treatment: 3 Vital Signs Height(in): 73 Pulse(bpm): 101 Weight(lbs): 166 Blood Pressure(mmHg): 146/63 Body Mass Index(BMI): 66 Temperature(F): 98.3 Respiratory Rate(breaths/min): 18 Photos: [4:No Photos Right Amputation Site -] [N/A:N/A N/A] Wound  Location: [4:Transmetatarsal Gradually Appeared] [N/A:N/A] Wounding Event: [4:Diabetic Wound/Ulcer of the Lower] [N/A:N/A] Primary Etiology: [4:Extremity Asthma, Chronic Obstructive] [N/A:N/A] Comorbid History: [4:Pulmonary Disease (COPD), Sleep Apnea, Congestive Heart Failure, Hypertension, Peripheral Venous Disease, Type II Diabetes, Osteoarthritis, Osteomyelitis, Neuropathy 03/06/2020] [N/A:N/A] Date Acquired: [4:3] [N/A:N/A] Weeks of Treatment: [4:Open] [N/A:N/A] Wound Status: [4:0.9x0.7x0.2] [N/A:N/A] Measurements L x W x D (cm) [4:0.495] [N/A:N/A] A (cm) : rea [4:0.099] [N/A:N/A] Volume (cm) : [4:42.70%] [N/A:N/A] % Reduction in Area: [4:61.80%] [N/A:N/A] % Reduction in Volume: [4:Grade 1] [N/A:N/A] Classification: [4:Medium] [N/A:N/A] Exudate A mount: [4:Serous] [N/A:N/A] Exudate Type: [4:amber] [N/A:N/A] Exudate Color: [4:Distinct, outline attached] [N/A:N/A] Wound Margin: [4:Small (1-33%)] [N/A:N/A]  Granulation A mount: [4:Pink] [N/A:N/A] Granulation Quality: [4:Large (67-100%)] [N/A:N/A] Necrotic A mount: [4:Fat Layer (Subcutaneous Tissue): Yes N/A] Exposed Structures: [4:Fascia: No Tendon: No Muscle: No Joint: No Bone: No Small (1-33%)] [N/A:N/A] Epithelialization: [4:Debridement - Excisional] [N/A:N/A] Debridement: Pre-procedure Verification/Time Out 11:23 [N/A:N/A] Taken: [4:Lidocaine 5% topical ointment] [N/A:N/A] Pain Control: [4:Subcutaneous, Slough] [N/A:N/A] Tissue Debrided: [4:Skin/Subcutaneous Tissue] [N/A:N/A] Level: [4:0.63] [N/A:N/A] Debridement A (sq cm): [4:rea Curette] [N/A:N/A] Instrument: [4:Moderate] [N/A:N/A] Bleeding: [4:Pressure] [N/A:N/A] Hemostasis A chieved: [4:0] [N/A:N/A] Procedural Pain: [4:0] [N/A:N/A] Post Procedural Pain: [4:Procedure was tolerated well] [N/A:N/A] Debridement Treatment Response: [4:0.9x0.7x0.2] [N/A:N/A] Post Debridement Measurements L x W x D (cm) [4:0.099] [N/A:N/A] Post Debridement Volume: (cm) [4:Debridement]  [N/A:N/A] Treatment Notes Wound #4 (Right Amputation Site - Transmetatarsal) 1. Cleanse With Wound Cleanser 3. Primary Dressing Applied Santyl 4. Secondary Dressing Dry Gauze Roll Gauze 5. Secured With Medipore tape Notes netting. Electronic Signature(s) Signed: 10/15/2020 9:36:19 AM By: Linton Ham MD Signed: 10/15/2020 5:51:14 PM By: Carlene Coria RN Entered By: Linton Ham on 10/14/2020 11:54:52 -------------------------------------------------------------------------------- Multi-Disciplinary Care Plan Details Patient Name: Date of Service: [name], Earley Favor [facility] 10/14/2020 10:15 A M Medical Record Number: [id number] Patient Account Number: [id number] Date of Birth/Sex: Treating RN: [birth year] (44 y.o. Orvan Falconer Primary Care Auri Jahnke: [name] Other Clinician: Referring Tajha Sammarco: Treating Kamaljit Hizer/Extender: Mammie Russian, Ivan Anchors in Treatment: 3 Active Inactive Abuse / Safety / Falls / Self Care Management Nursing Diagnoses: History of Falls Potential for injury related to falls Goals: Patient will not experience any injury related to falls Date Initiated: 09/22/2020 Target Resolution Date: 10/24/2020 Goal Status: Active Patient/caregiver will verbalize/demonstrate measures taken to prevent injury and/or falls Date Initiated: 09/22/2020 Target Resolution Date: 10/24/2020 Goal Status: Active Interventions: Assess Activities of Daily Living upon admission and as needed Assess fall risk on admission and as needed Assess: immobility, friction, shearing, incontinence upon admission and as needed Assess impairment of mobility on admission and as needed per policy Assess personal safety and home safety (as indicated) on admission and as needed Assess self care needs on admission and as needed Provide education on fall prevention Notes: Nutrition Nursing Diagnoses: Impaired glucose control: actual or potential Potential for alteratiion  in Nutrition/Potential for imbalanced nutrition Goals: Patient/caregiver agrees to and verbalizes understanding of need to use nutritional supplements and/or vitamins as prescribed Date Initiated: 09/22/2020 Target Resolution Date: 10/24/2020 Goal Status: Active Patient/caregiver will maintain therapeutic glucose control Date Initiated: 09/22/2020 Target Resolution Date: 10/24/2020 Goal Status: Active Interventions: Assess HgA1c results as ordered upon admission and as needed Assess patient nutrition upon admission and as needed per policy Provide education on elevated blood sugars and impact on wound healing Provide education on nutrition Treatment Activities: Education provided on Nutrition : 10/06/2020 Notes: Wound/Skin Impairment Nursing Diagnoses: Impaired tissue integrity Knowledge deficit related to ulceration/compromised skin integrity Goals: Patient/caregiver will verbalize understanding of skin care regimen Date Initiated: 09/22/2020 Target Resolution Date: 10/24/2020 Goal Status: Active Ulcer/skin breakdown will have a volume reduction of 30% by week 4 Date Initiated: 09/22/2020 Target Resolution Date: 10/24/2020 Goal Status: Active Interventions: Assess patient/caregiver ability to obtain necessary supplies Assess patient/caregiver ability to perform ulcer/skin care regimen upon admission and as needed Assess ulceration(s) every visit Provide education on ulcer and skin care Notes: Electronic Signature(s) Signed: 10/15/2020 5:51:14 PM By: Carlene Coria RN Entered By: Carlene Coria on 10/14/2020 10:18:55 -------------------------------------------------------------------------------- Pain Assessment Details Patient Name: Date of Service: [name] [facility] 10/14/2020 10:15 A M Medical Record Number: [id number] Patient Account Number: [id number] Date of Birth/Sex:  Treating RN: Dec 18, 1975 (45 y.o. [name] Primary Care Madasyn Heath: [name] Other  Clinician: Referring Evart Mcdonnell: Treating Rickie Gange/Extender: Mammie Russian, Ivan Anchors in Treatment: 3 Active Problems Location of Pain Severity and Description of Pain Patient Has Paino Yes Site Locations Pain Location: Pain in Ulcers With Dressing Change: Yes Duration of the Pain. Constant / Intermittento Intermittent Rate the pain. Current Pain Level: 5 Character of Pain Describe the Pain: Throbbing Pain Management and Medication Current Pain Management: Medication: Yes Cold Application: No Rest: No Massage: No Activity: No T.E.N.S.: No Heat Application: No Leg drop or elevation: No Is the Current Pain Management Adequate: Adequate How does your wound impact your activities of daily livingo Sleep: No Bathing: No Appetite: No Relationship With Others: No Bladder Continence: No Emotions: No Bowel Continence: No Work: No Toileting: No Drive: No Dressing: No Hobbies: No Electronic Signature(s) Signed: 10/16/2020 8:28:44 AM By: Levan Hurst RN, BSN Entered By: Levan Hurst on 10/14/2020 10:45:01 -------------------------------------------------------------------------------- Patient/Caregiver Education Details Patient Name: Date of Service: [name], [name] 11/9/2021andnbsp10:15 A M Medical Record Number: [id number] Patient Account Number: [id number] Date of Birth/Gender: Treating RN: 06-25-1976 (44 y.o. Orvan Falconer Primary Care Physician: [name] Other Clinician: Referring Physician: Treating Physician/Extender: Mammie Russian, Ivan Anchors in Treatment: 3 Education Assessment Education Provided To: Patient Education Topics Provided Elevated Blood Sugar/ Impact on Healing: Methods: Explain/Verbal Responses: State content correctly Nutrition: Methods: Explain/Verbal Responses: State content correctly Electronic Signature(s) Signed: 10/15/2020 5:51:14 PM By: Carlene Coria RN Entered By: Carlene Coria on  10/14/2020 10:19:14 -------------------------------------------------------------------------------- Wound Assessment Details Patient Name: Date of Service: [name] [facility] 10/14/2020 10:15 A M Medical Record Number: [id number] Patient Account Number: [id number] Date of Birth/Sex: Treating RN: 26-May-1976 (44 y.o. [name] Primary Care Keyani Rigdon: [name] Other Clinician: Referring Lynard Postlewait: Treating Honora Searson/Extender: Mammie Russian, Ivan Anchors in Treatment: 3 Wound Status Wound Number: 4 Primary Diabetic Wound/Ulcer of the Lower Extremity Etiology: Wound Location: Right Amputation Site - Transmetatarsal Wound Open Wounding Event: Gradually Appeared Status: Date Acquired: 03/06/2020 Comorbid Asthma, Chronic Obstructive Pulmonary Disease (COPD), Sleep Weeks Of Treatment: 3 History: Apnea, Congestive Heart Failure, Hypertension, Peripheral Venous Clustered Wound: No Disease, Type II Diabetes, Osteoarthritis, Osteomyelitis, Neuropathy Photos Photo Uploaded By: Mikeal Hawthorne on 10/15/2020 10:31:25 Wound Measurements Length: (cm) 0.9 Width: (cm) 0.7 Depth: (cm) 0.2 Area: (cm) 0.495 Volume: (cm) 0.099 Wound Description Classification: Grade 1 Wound Margin: Distinct, outline attached Exudate Amount: Medium Exudate Type: Serous Exudate Color: amber Foul Odor After Cleansing: Slough/Fibrino % Reduction in Area: 42.7% % Reduction in Volume: 61.8% Epithelialization: Small (1-33%) Tunneling: No Undermining: No No Yes Wound Bed Granulation Amount: Small (1-33%) Exposed Structure Granulation Quality: Pink Fascia Exposed: No Necrotic Amount: Large (67-100%) Fat Layer (Subcutaneous Tissue) Exposed: Yes Necrotic Quality: Adherent Slough Tendon Exposed: No Muscle Exposed: No Joint Exposed: No Bone Exposed: No Treatment Notes Wound #4 (Right Amputation Site - Transmetatarsal) 1. Cleanse With Wound Cleanser 3. Primary Dressing  Applied Santyl 4. Secondary Dressing Dry Gauze Roll Gauze 5. Secured With Medipore tape Notes netting. Electronic Signature(s) Signed: 10/16/2020 8:28:44 AM By: Levan Hurst RN, BSN Entered By: Levan Hurst on 10/14/2020 10:46:57 -------------------------------------------------------------------------------- Vitals Details Patient Name: Date of Service: [name], [name] 10/14/2020 10:15 A M Medical Record Number: [id number] Patient Account Number: [id number] Date of Birth/Sex: Treating RN: 1976-07-24 (44 y.o. [name] Primary Care Drake Wuertz: [name] Other Clinician: Referring Isaiyah Feldhaus: Treating Kamarri Lovvorn/Extender: Mammie Russian, Ivan Anchors in Treatment: 3 Vital Signs Time Taken:  10:42 Temperature (F): 98.3 Height (in): 62 Pulse (bpm): 101 Weight (lbs): 362 Respiratory Rate (breaths/min): 18 Body Mass Index (BMI): 66.2 Blood Pressure (mmHg): 146/63 Reference Range: 80 - 120 mg / dl Electronic Signature(s) Signed: 10/16/2020 8:28:44 AM By: Levan Hurst RN, BSN Entered By: Levan Hurst on 10/14/2020 10:44:46

## 2020-10-16 NOTE — Patient Instructions (Addendum)
It was nice to see you today!  We have made a change in your insulin regimen.    STOP taking Humulin 70/30.  START taking Lantus 70 units in the morning and 70 units in the evening.  START taking Novolog 20 units three times daily with meals. CONTINUE Victoza 1.2mg  minus 6 clicks daily.   We have sent new prescriptions to the pharmacy for Dexcom sensors and transmitters for insurance approval.  If you have trouble getting these from the pharmacy, give [place] a call.

## 2020-10-16 NOTE — Progress Notes (Signed)
FINA, HEIZER ([id number]) Visit Report for 10/14/2020 Debridement Details Patient Name: Date of Service: [name] [facility] 10/14/2020 10:15 A M Medical Record Number: [id number] Patient Account Number: [id number] Date of Birth/Sex: Treating RN: 1975/12/31 (44 y.o. Orvan Falconer Primary Care Provider: [name] Other Clinician: Referring Provider: Treating Provider/Extender: Mammie Russian, Ivan Anchors in Treatment: 3 Debridement Performed for Assessment: Wound #4 Right Amputation Site - Transmetatarsal Performed By: Physician Ricard Dillon., MD Debridement Type: Debridement Severity of Tissue Pre Debridement: Fat layer exposed Level of Consciousness (Pre-procedure): Awake and Alert Pre-procedure Verification/Time Out Yes - 11:23 Taken: Start Time: 11:23 Pain Control: Lidocaine 5% topical ointment T Area Debrided (L x W): otal 0.9 (cm) x 0.7 (cm) = 0.63 (cm) Tissue and other material debrided: Viable, Non-Viable, Slough, Subcutaneous, Skin: Dermis , Skin: Epidermis, Slough Level: Skin/Subcutaneous Tissue Debridement Description: Excisional Instrument: Curette Bleeding: Moderate Hemostasis Achieved: Pressure End Time: 11:25 Procedural Pain: 0 Post Procedural Pain: 0 Response to Treatment: Procedure was tolerated well Level of Consciousness (Post- Awake and Alert procedure): Post Debridement Measurements of Total Wound Length: (cm) 0.9 Width: (cm) 0.7 Depth: (cm) 0.2 Volume: (cm) 0.099 Character of Wound/Ulcer Post Debridement: Improved Severity of Tissue Post Debridement: Fat layer exposed Post Procedure Diagnosis Same as Pre-procedure Electronic Signature(s) Signed: 10/15/2020 9:36:19 AM By: Linton Ham MD Signed: 10/15/2020 5:51:14 PM By: Carlene Coria RN Entered By: Linton Ham on 10/14/2020 11:55:01 -------------------------------------------------------------------------------- HPI Details Patient Name: Date of Service: [name], TA  SHA 10/14/2020 10:15 A M Medical Record Number: [id number] Patient Account Number: [id number] Date of Birth/Sex: Treating RN: Oct 16, 1976 (44 y.o. Orvan Falconer Primary Care Provider: [name] Other Clinician: Referring Provider: Treating Provider/Extender: Mammie Russian, Ivan Anchors in Treatment: 3 History of Present Illness HPI Description: 05/30/19 patient presents today for initial evaluation our clinic concerning significant wounds that she has in the right gluteal region as well as in the left abdominal area. She also has a pressure injury to her trans metatarsal amputation site on the right which does not appear to be as looking at this time but nonetheless does need to close. This occurred while she was in the hospital. The patient was admitted to the hospital on 05/17/19 discharged on 05/25/19. This was due to the significant abscess in the right gluteal region as well as in the left panus region. She has a history of diabetes which is definitely not under good control of the most useful view of an agency being 17.5. She also has hypertension, COPD, and is morbidly obese. Unfortunately the wound and the right gluteal region extends right up and adjacent to the anus which is going to make the Wound VAC impossible at this point. Nonetheless I think that we're gonna have to basically pack this with wet to dry packing at this time in order to hopefully achieve improvement. Nonetheless this is a significant wound with her diabetes be not under control along with her size and again the nature of the wound I think this is gonna take a very long time to heal. 07/04/2019 upon evaluation today patient actually appears to be doing better in regard to her wounds. Her foot ulcer actually appears to be completely healed which is great the abdominal ulcer has filled in quite nicely and seems to be doing well although there is a lot of moisture I think that she would benefit from  a silver alginate dressing. Nonetheless in regard to the wound on her right gluteal region this showed signs  of improvement today still this is a fairly significant ulcer unfortunately. 08/01/2019 on evaluation patient presents for follow-up concerning her wounds over the right gluteal region as well as the left abdominal region. Fortunately both areas are showing signs of improvement which is good news. In fact significant improvement. Spent about a month since have seen her last. Overall I am extremely happy though with the findings of how the wounds have progressed at this point. READMISSION 09/22/2020 This is a now 44 year old woman previously in this clinic for a right gluteal abscess for 3 or 4 visits in 2020. She is a poorly controlled type II diabetic with the last hemoglobin A1c I see in epic at 12.7 in July 2021. She tells [place] that she is here for review of a wound on the tip of her right first metatarsal transmetatarsal amputation. She had an angiogram and angioplasty on 12/20/2018 with angioplasty of her anterior tibial and posterior tibial arteries. She has been followed by Dr. Doran Durand most recent treatment since August I think has been Santyl. She was given a 1 week course of Keflex in mid September. The patient's had an x-ray and also an MRI in August 2021 that did not show osteomyelitis but edema ABI in our clinic was 1.19 Past medical history includes hypertension, hyperlipidemia, COPD, bipolar disorder, poorly controlled type 2 diabetes with PAD. Left BKA, right transmetatarsal amputation, heart failure with preserved ejection fraction, she is minimally I am ambulatory walking only from her wheelchair to the bathroom with her walker. She only wears a stocking on her foot. Otherwise she is in wheelchair or in bed all day long. 11/1; patient readmitted to the clinic 2 weeks ago. She has a wound on the tip of the right first metatarsal transmetatarsal amputation site she has been  using Santyl. She has Medicaid and we have to apply for assistance if we are going to continue with Santyl although I am hopeful that we will be able to change from the [facility] next week. 11/9; area on the tip of the right first metatarsal amputation site. She has been using Santyl. She was in the hospital overnight for chest pain and they gave her another tube of Santyl which is nice since she has Medicaid and Medicaid would not pay for that. Her wound is smaller Electronic Signature(s) Signed: 10/15/2020 9:36:19 AM By: Linton Ham MD Entered By: Linton Ham on 10/14/2020 11:55:47 -------------------------------------------------------------------------------- Physical Exam Details Patient Name: Date of Service: [name] [facility] 10/14/2020 10:15 A M Medical Record Number: [id number] Patient Account Number: [id number] Date of Birth/Sex: Treating RN: 08-12-1976 (44 y.o. Orvan Falconer Primary Care Provider: [name] Other Clinician: Referring Provider: Treating Provider/Extender: Mammie Russian, Ivan Anchors in Treatment: 3 Constitutional Patient is hypertensive.. Pulse regular and within target range for patient.Marland Kitchen Respirations regular, non-labored and within target range.. Temperature is normal and within the target range for the patient.Marland Kitchen Appears in no distress. Cardiovascular Pulses palpable in the right foot. Notes Wound exam; right first MTP transmetatarsal amputation site using a #3 curette again I did a surface debridement of subcutaneous debris which is adherent. However this is a lot better than last week and the wound cleans up quite nicely. There is no evidence of surrounding infection Electronic Signature(s) Signed: 10/15/2020 9:36:19 AM By: Linton Ham MD Entered By: Linton Ham on 10/14/2020 11:56:53 -------------------------------------------------------------------------------- Physician Orders Details Patient Name: Date of Service: [name], TA [facility] 10/14/2020 10:15 A M Medical Record Number: [id number] Patient Account Number: [id number] Date  of Birth/Sex: Treating RN: 12-13-1975 (44 y.o. Orvan Falconer Primary Care Provider: [name] Other Clinician: Referring Provider: Treating Provider/Extender: Mammie Russian, Ivan Anchors in Treatment: 3 Verbal / Phone Orders: No Diagnosis Coding ICD-10 Coding Code Description E11.621 Type 2 diabetes mellitus with foot ulcer L97.512 Non-pressure chronic ulcer of other part of right foot with fat layer exposed Z89.512 Acquired absence of left leg below knee E11.51 Type 2 diabetes mellitus with diabetic peripheral angiopathy without gangrene Follow-up Appointments Return Appointment in 1 week. Dressing Change Frequency Wound #4 Right Amputation Site - Transmetatarsal Change dressing every day. Wound Cleansing Wound #4 Right Amputation Site - Transmetatarsal May shower and wash wound with soap and water. Primary Wound Dressing Wound #4 Right Amputation Site - Transmetatarsal Santyl Ointment Secondary Dressing Wound #4 Right Amputation Site - Transmetatarsal Kerlix/Rolled Gauze Dry Gauze Electronic Signature(s) Signed: 10/15/2020 9:36:19 AM By: Linton Ham MD Signed: 10/15/2020 5:51:14 PM By: Carlene Coria RN Entered By: Carlene Coria on 10/14/2020 10:18:42 -------------------------------------------------------------------------------- Problem List Details Patient Name: Date of Service: [name], [facility] 10/14/2020 10:15 A M Medical Record Number: [id number] Patient Account Number: [id number] Date of Birth/Sex: Treating RN: [birth year] (44 y.o. Orvan Falconer Primary Care Provider: [name] Other Clinician: Referring Provider: Treating Provider/Extender: Mammie Russian, Ivan Anchors in Treatment: 3 Active Problems ICD-10 Encounter Encounter Code Description Active Date MDM Diagnosis E11.621 Type 2 diabetes mellitus with  foot ulcer 09/22/2020 No Yes L97.512 Non-pressure chronic ulcer of other part of right foot with fat layer exposed 09/22/2020 No Yes Z89.512 Acquired absence of left leg below knee 09/22/2020 No Yes E11.51 Type 2 diabetes mellitus with diabetic peripheral angiopathy without gangrene 09/22/2020 No Yes Inactive Problems Resolved Problems Electronic Signature(s) Signed: 10/15/2020 9:36:19 AM By: Linton Ham MD Entered By: Linton Ham on 10/14/2020 11:54:44 -------------------------------------------------------------------------------- Progress Note Details Patient Name: Date of Service: [name], [name] 10/14/2020 10:15 A M Medical Record Number: [id number] Patient Account Number: [id number] Date of Birth/Sex: Treating RN: Dec 12, 1975 (44 y.o. Orvan Falconer Primary Care Provider: [name] Other Clinician: Referring Provider: Treating Provider/Extender: Mammie Russian, Ivan Anchors in Treatment: 3 Subjective History of Present Illness (HPI) 05/30/19 patient presents today for initial evaluation our clinic concerning significant wounds that she has in the right gluteal region as well as in the left abdominal area. She also has a pressure injury to her trans metatarsal amputation site on the right which does not appear to be as looking at this time but nonetheless does need to close. This occurred while she was in the hospital. The patient was admitted to the hospital on 05/17/19 discharged on 05/25/19. This was due to the significant abscess in the right gluteal region as well as in the left panus region. She has a history of diabetes which is definitely not under good control of the most useful view of an agency being 17.5. She also has hypertension, COPD, and is morbidly obese. Unfortunately the wound and the right gluteal region extends right up and adjacent to the anus which is going to make the Wound VAC impossible at this point. Nonetheless I think that we're  gonna have to basically pack this with wet to dry packing at this time in order to hopefully achieve improvement. Nonetheless this is a significant wound with her diabetes be not under control along with her size and again the nature of the wound I think this is gonna take a very long time to heal. 07/04/2019 upon evaluation today patient actually  appears to be doing better in regard to her wounds. Her foot ulcer actually appears to be completely healed which is great the abdominal ulcer has filled in quite nicely and seems to be doing well although there is a lot of moisture I think that she would benefit from a silver alginate dressing. Nonetheless in regard to the wound on her right gluteal region this showed signs of improvement today still this is a fairly significant ulcer unfortunately. 08/01/2019 on evaluation patient presents for follow-up concerning her wounds over the right gluteal region as well as the left abdominal region. Fortunately both areas are showing signs of improvement which is good news. In fact significant improvement. Spent about a month since have seen her last. Overall I am extremely happy though with the findings of how the wounds have progressed at this point. READMISSION 09/22/2020 This is a now 44 year old woman previously in this clinic for a right gluteal abscess for 3 or 4 visits in 2020. She is a poorly controlled type II diabetic with the last hemoglobin A1c I see in epic at 12.7 in July 2021. She tells [place] that she is here for review of a wound on the tip of her right first metatarsal transmetatarsal amputation. She had an angiogram and angioplasty on 12/20/2018 with angioplasty of her anterior tibial and posterior tibial arteries. She has been followed by Dr. Doran Durand most recent treatment since August I think has been Santyl. She was given a 1 week course of Keflex in mid September. The patient's had an x-ray and also an MRI in August 2021 that did not show  osteomyelitis but edema ABI in our clinic was 1.19 Past medical history includes hypertension, hyperlipidemia, COPD, bipolar disorder, poorly controlled type 2 diabetes with PAD. Left BKA, right transmetatarsal amputation, heart failure with preserved ejection fraction, she is minimally I am ambulatory walking only from her wheelchair to the bathroom with her walker. She only wears a stocking on her foot. Otherwise she is in wheelchair or in bed all day long. 11/1; patient readmitted to the clinic 2 weeks ago. She has a wound on the tip of the right first metatarsal transmetatarsal amputation site she has been using Santyl. She has Medicaid and we have to apply for assistance if we are going to continue with Santyl although I am hopeful that we will be able to change from the [facility] next week. 11/9; area on the tip of the right first metatarsal amputation site. She has been using Santyl. She was in the hospital overnight for chest pain and they gave her another tube of Santyl which is nice since she has Medicaid and Medicaid would not pay for that. Her wound is smaller Patient History Unable to Obtain Patient History due to Altered Mental Status. Information obtained from Patient. Family History Cancer - Maternal Grandparents, Diabetes - Mother, Heart Disease - Father,Paternal Grandparents, No family history of Hereditary Spherocytosis, Hypertension, Kidney Disease, Lung Disease, Seizures, Stroke, Thyroid Problems, Tuberculosis. Social History Former smoker, Marital Status - Separated, Alcohol Use - Never, Drug Use - No History, Caffeine Use - Moderate. Medical History Eyes Denies history of Cataracts, Glaucoma, Optic Neuritis Ear/Nose/Mouth/Throat Denies history of Chronic sinus problems/congestion, Middle ear problems Hematologic/Lymphatic Denies history of Anemia, Hemophilia, Human Immunodeficiency Virus, Lymphedema, Sickle Cell Disease Respiratory Patient has history of Asthma, Chronic  Obstructive Pulmonary Disease (COPD), Sleep Apnea - uses Bipap with O2 Denies history of Aspiration, Pneumothorax, Tuberculosis Cardiovascular Patient has history of Congestive Heart Failure, Hypertension, Peripheral Venous Disease  Denies history of Angina, Arrhythmia, Coronary Artery Disease, Deep Vein Thrombosis, Hypotension, Myocardial Infarction, Peripheral Arterial Disease, Phlebitis, Vasculitis Gastrointestinal Denies history of Cirrhosis , Colitis, Crohnoos, Hepatitis A, Hepatitis B, Hepatitis C Endocrine Patient has history of Type II Diabetes Denies history of Type I Diabetes Genitourinary Denies history of End Stage Renal Disease Immunological Denies history of Lupus Erythematosus, Raynaudoos, Scleroderma Integumentary (Skin) Denies history of History of Burn Musculoskeletal Patient has history of Osteoarthritis, Osteomyelitis - left lower leg Denies history of Gout, Rheumatoid Arthritis Neurologic Patient has history of Neuropathy Denies history of Dementia, Quadriplegia, Paraplegia, Seizure Disorder Oncologic Denies history of Received Chemotherapy, Received Radiation Psychiatric Denies history of Anorexia/bulimia, Confinement Anxiety Hospitalization/Surgery History - IandD perirectal abscess. - left BKA. - right transmetatarsal amputation. - excision of posterior neck abscess. - cardiac cath. - right knee arthroscopy. - c-section. - chest pain 10/09/20. Medical A Surgical History Notes nd Constitutional Symptoms (General Health) morbid obesity Cardiovascular hyperlipidemia Gastrointestinal GERD Psychiatric bipolar Objective Constitutional Patient is hypertensive.. Pulse regular and within target range for patient.Marland Kitchen Respirations regular, non-labored and within target range.. Temperature is normal and within the target range for the patient.Marland Kitchen Appears in no distress. Vitals Time Taken: 10:42 AM, Height: 62 in, Weight: 362 lbs, BMI: 66.2, Temperature: 98.3 F,  Pulse: 101 bpm, Respiratory Rate: 18 breaths/min, Blood Pressure: 146/63 mmHg. Cardiovascular Pulses palpable in the right foot. General Notes: Wound exam; right first MTP transmetatarsal amputation site using a #3 curette again I did a surface debridement of subcutaneous debris which is adherent. However this is a lot better than last week and the wound cleans up quite nicely. There is no evidence of surrounding infection Integumentary (Hair, Skin) Wound #4 status is Open. Original cause of wound was Gradually Appeared. 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 The wound measures 0.9cm length x 0.7cm width x 0.2cm depth; 0.495cm^2 area and 0.099cm^3 volume. There is Fat Layer (Subcutaneous Tissue) exposed. There is no tunneling or undermining noted. There is a medium amount of serous drainage noted. The wound margin is distinct with the outline attached to the wound base. There is small (1-33%) pink granulation within the wound bed. There is a large (67-100%) amount of necrotic tissue within the wound bed including Adherent Slough. 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 There was a Excisional Skin/Subcutaneous Tissue Debridement with a total area of 0.63 sq cm performed by Ricard Dillon., MD. With the following instrument(s): Curette to remove Viable and Non-Viable tissue/material. Material removed includes Subcutaneous Tissue, Slough, Skin: Dermis, and Skin: Epidermis after achieving pain control using Lidocaine 5%  topical ointment. No specimens were taken. A time out was conducted at 11:23, prior to the start of the procedure. A Moderate amount of bleeding was controlled with Pressure. The procedure was tolerated well with a pain level of 0 throughout and a pain level of 0 following the procedure. Post Debridement Measurements: 0.9cm length x 0.7cm width x 0.2cm depth; 0.099cm^3 volume. Character of Wound/Ulcer Post Debridement is improved. Severity of Tissue Post Debridement is: Fat layer exposed. Post procedure Diagnosis Wound #4: Same  as Pre-Procedure Plan Follow-up Appointments: Return Appointment in 1 week. Dressing Change Frequency: Wound #4 Right Amputation Site - Transmetatarsal: Change dressing every day. Wound Cleansing: Wound #4 Right Amputation Site - Transmetatarsal: May shower and wash wound with soap and water. Primary Wound Dressing: Wound #4 Right Amputation Site - Transmetatarsal: Santyl Ointment Secondary Dressing: Wound #4 Right Amputation Site - Transmetatarsal: Kerlix/Rolled Gauze Dry Gauze 1. We are continuing with Santyl for this week. 2. Thankfully she got another tube of Santyl and while she was in the hospital overnight with chest pain. She is following up with her cardiologist 3. I do not believe there is any issues with her arterial status 4. Perhaps silver collagen next week Electronic Signature(s) Signed: 10/15/2020 9:36:19 AM By: Linton Ham MD Entered By: Linton Ham on 10/14/2020 11:57:51 -------------------------------------------------------------------------------- HxROS Details Patient Name: Date of Service: [name], [name] 10/14/2020 10:15 A M Medical Record Number: [id number] Patient Account Number: [id number] Date of Birth/Sex: Treating RN: 1975-12-14 (44 y.o. [name] Primary Care Provider: [name] Other Clinician: Referring Provider: Treating Provider/Extender: Mammie Russian, Ivan Anchors in Treatment:  3 Unable to Obtain Patient History due to Altered Mental Status Information Obtained From Patient Constitutional Symptoms (General Health) Medical History: Past Medical History Notes: morbid obesity Eyes Medical History: Negative for: Cataracts; Glaucoma; Optic Neuritis Ear/Nose/Mouth/Throat Medical History: Negative for: Chronic sinus problems/congestion; Middle ear problems Hematologic/Lymphatic Medical History: Negative for: Anemia; Hemophilia; Human Immunodeficiency Virus; Lymphedema; Sickle Cell Disease Respiratory Medical History: Positive for: Asthma; Chronic Obstructive Pulmonary Disease (COPD); Sleep Apnea - uses Bipap with O2 Negative for: Aspiration; Pneumothorax; Tuberculosis Cardiovascular Medical History: Positive for: Congestive Heart Failure; Hypertension; Peripheral Venous Disease Negative for: Angina; Arrhythmia; Coronary Artery Disease; Deep Vein Thrombosis; Hypotension; Myocardial Infarction; Peripheral Arterial Disease; Phlebitis; Vasculitis Past Medical History Notes: hyperlipidemia Gastrointestinal Medical History: Negative for: Cirrhosis ; Colitis; Crohns; Hepatitis A; Hepatitis B; Hepatitis C Past Medical History Notes: GERD Endocrine Medical History: Positive for: Type II Diabetes Negative for: Type I Diabetes Time with diabetes: 20 years Treated with: Insulin Genitourinary Medical History: Negative for: End Stage Renal Disease Immunological Medical History: Negative for: Lupus Erythematosus; Raynauds; Scleroderma Integumentary (Skin) Medical History: Negative for: History of Burn Musculoskeletal Medical History: Positive for: Osteoarthritis; Osteomyelitis - left lower leg Negative for: Gout; Rheumatoid Arthritis Neurologic Medical History: Positive for: Neuropathy Negative for: Dementia; Quadriplegia; Paraplegia; Seizure Disorder Oncologic Medical History: Negative for: Received Chemotherapy; Received  Radiation Psychiatric Medical History: Negative for: Anorexia/bulimia; Confinement Anxiety Past Medical History Notes: bipolar Immunizations Pneumococcal Vaccine: Received Pneumococcal Vaccination: No Implantable Devices Yes Hospitalization / Surgery History Type of Hospitalization/Surgery IandD perirectal abscess left BKA right transmetatarsal amputation excision of posterior neck abscess cardiac cath right knee arthroscopy c-section chest pain 10/09/20 Family and Social History Cancer: Yes - Maternal Grandparents; Diabetes: Yes - Mother; Heart Disease: Yes - Father,Paternal Grandparents; Hereditary Spherocytosis: No; Hypertension: No; Kidney Disease: No; Lung Disease: No; Seizures: No; Stroke: No; Thyroid Problems: No; Tuberculosis: No; Former smoker; Marital Status - Separated; Alcohol Use: Never; Drug Use: No History; Caffeine Use: Moderate; Financial Concerns: No; Food, Clothing or Shelter Needs: No; Support System Lacking: No; Transportation Concerns: No Engineer, maintenance) Signed: 10/15/2020 9:36:19 AM By: Linton Ham MD Signed: 10/16/2020 8:28:44 AM By: Levan Hurst RN, BSN Entered By: Levan Hurst on 10/14/2020 10:40:01 -------------------------------------------------------------------------------- SuperBill Details Patient Name: Date of Service: [name], TA [facility] 10/14/2020 Medical Record Number: [id number] Patient Account Number: [id number] Date of Birth/Sex: Treating RN: 01-Feb-1976 (44 y.o. 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.512 Acquired absence of left leg below knee E11.51 Type 2 diabetes mellitus with diabetic peripheral  angiopathy without gangrene Facility Procedures CPT4 Code: 53646803 Description: 21224 - DEB SUBQ TISSUE 20 SQ CM/< ICD-10 Diagnosis Description L97.512 Non-pressure chronic ulcer of other part of right foot with fat layer exposed E11.621 Type 2 diabetes mellitus with foot ulcer Modifier: Quantity: 1 Physician Procedures : CPT4 Code Description Modifier [id number] - WC PHYS SUBQ TISS 20 SQ CM ICD-10 Diagnosis Description L97.512 Non-pressure chronic ulcer of other part of right foot with fat layer exposed E11.621 Type 2 diabetes mellitus with foot ulcer Quantity: 1 Electronic Signature(s) Signed: 10/15/2020 9:36:19 AM By: Linton Ham MD Entered By: Linton Ham on 10/14/2020 11:58:04

## 2020-10-16 NOTE — Assessment & Plan Note (Signed)
>>  ASSESSMENT AND PLAN FOR INSULIN DEPENDENT TYPE 2 DIABETES MELLITUS ([place]) WRITTEN ON 10/16/2020  5:00 PM BY KOVAL, PETER G, RPH-CPP  Diabetes currently uncontrolled with last A1c > 7%. Medication adherence appears fair. Home sugars remain elevated despite current diabetes regimen. Plan to adjust insulin regimen due to suboptimal control with Humulin 70/30 mix. Additionally, patient is a good candidate for a CGM device as she check sugars 3-4 times a day and is now on multiple insulin injections per day.  -Started basal insulin Lantus 70 units twice daily.  -Started rapid insulin Novolog 20 units three times daily with meals  -Discontinued Humulin 70/30 mix insulin -Continued Victoza 1.2 mg minus 6 clicks daily.  -Sent prescription for Dexcom CGM device -Extensively discussed pathophysiology of diabetes, recommended lifestyle interventions, dietary effects on blood sugar control

## 2020-10-16 NOTE — Progress Notes (Signed)
    S:  Chief Complaint  Patient presents with  . Medication Management    Type 2 diabetes    Patient arrives in good spirits, with a wheelchair, accompanied by her friend Hildred Alamin.  Presents for diabetes evaluation, education, and management Patient was referred and last seen by Primary Care Provider on 08/12/20 (Dr. [name]).   Patient reports she check her sugars 3-4 times a day and sugars continuously remain in the 300-400s.  Insurance coverage/medication affordability: Medicaid [employer]  Medication adherence reported appears fair .   Current diabetes medications include: Humulin 70/30 100 units in the morning and 90 units in the evening, Victoza 1.2 mg minus 6 clicks daily Current hyperlipidemia medications include: atorvastatin 40mg  daily  Patient denies hypoglycemic events.  Patient reported dietary habits: no fried foods, cooks at home, cut out a lot of bread however eats a lot of pasta and potatoes, eats salads, drinks water, 1-2 regular soda per day  Patient reports she has a pressure ulcer that she is being followed by wound care for.  Patient reports nocturia (nighttime urination), 1-2 times per night.  Patient reports neuropathy (nerve pain) in fingers, states they are painful.   O:  Physical Exam Neurological:     Mental Status: She is alert.  Psychiatric:        Mood and Affect: Mood normal.        Behavior: Behavior normal.        Judgment: Judgment normal.    Review of Systems  Neurological: Positive for tingling.  All other systems reviewed and are negative.  Lab Results  Component Value Date   HGBA1C >15.5 (H) 10/09/2020   Vitals:   10/16/20 1507  BP: (!) 142/80  Pulse: (!) 101  SpO2: 95%    Lipid Panel     Component Value Date/Time   CHOL 274 (H) 04/10/2020 1200   TRIG 324 (H) 04/10/2020 1200   HDL 39 (L) 04/10/2020 1200   CHOLHDL 7.0 (H) 04/10/2020 1200   CHOLHDL 6.3 (H) 01/11/2017 0909   VLDL 52 (H) 01/11/2017 0909   LDLCALC 173 (H)  04/10/2020 1200   LDLDIRECT 164.9 11/15/2014 1148    Blood sugar readings: Primarily in the 400's, mostly 300-400's. Not normally below 360. Lowest today was 260 at 11:30 this morning  A/P: Diabetes currently uncontrolled with last A1c > 7%. Medication adherence appears fair. Home sugars remain elevated despite current diabetes regimen. Plan to adjust insulin regimen due to suboptimal control with Humulin 70/30 mix. Additionally, patient is a good candidate for a CGM device as she check sugars 3-4 times a day and is now on multiple insulin injections per day.  -Started basal insulin Lantus 70 units twice daily.  -Started rapid insulin Novolog 20 units three times daily with meals  -Discontinued Humulin 70/30 mix insulin -Continued Victoza 1.2 mg minus 6 clicks daily.  -Sent prescription for Dexcom CGM device -Extensively discussed pathophysiology of diabetes, recommended lifestyle interventions, dietary effects on blood sugar control -Next A1C anticipated 01/2021.    Written patient instructions provided. Total time in face to face counseling 20 minutes.    Follow up with PCP (Dr. [name]) on 10/21/20.  Patient seen with Adria Dill, PharmD Candidate, Dimple Nanas, PharmD - PGY-1 Resident and Lorel Monaco, PharmD, PGY2 Pharmacy Resident.

## 2020-10-17 ENCOUNTER — Telehealth: Payer: Self-pay

## 2020-10-17 NOTE — Telephone Encounter (Signed)
Received phone call from patient regarding abscess on right inner thigh, onset Monday. Home health nurse reports bad odor and green drainage coming from site. States that drainage has somewhat cleared in color since the beginning of the week, however, strong odor is still present. Denies fever, redness or warmth at the site.   Spoke with Dr. [name] regarding patient. If symptoms have worsened since the beginning of the week, advising sooner evaluation than scheduled office visit on 11/16. Advised patient be evaluated in UC due to worsening of symptoms. Patient declines and scheduled appointment for Monday morning.   Strict ED precautions given for over the weekend.   Talbot Grumbling, RN

## 2020-10-17 NOTE — Progress Notes (Signed)
Reviewed: I agree with Dr. Koval's documentation and management. 

## 2020-10-19 NOTE — Progress Notes (Signed)
    SUBJECTIVE:   CHIEF COMPLAINT / HPI:   [name] is a 44 yo F who presents for the below issue.   Abscess Noticed infection 1 week ago. Attempted to clean with peroxide and witch hazel. Stopped due to the wound continuing to be wet. Continuing to keep it dry with a gauze at the site. Endorses some discomfort with touching the area. Denies fever and drainage from the site.   Needs medical supplies Needs a new shower chair. Her previous one is now broken and she requires this to shower. She will also need chucks pads for her bed. She continues to wet her bed with using bedpan.   PERTINENT  PMH / PSH: HTN, T2DM, s/p Lt. BKA  OBJECTIVE:   BP 130/82   Pulse 98   Temp 98.3 F (36.8 C) (Oral)   Wt (!) 356 lb 3.2 oz (161.6 kg)   SpO2 95%   BMI 65.15 kg/m   General: Appears well, no acute distress. Age appropriate. Skin: 1 cm right pannus wound with granulation tissue with surrounding 4-5 cm hard erythematous surrounding. Wound painful with manipulation of granulation tissue. No drainage.  Media Information    ASSESSMENT/PLAN:   Abscess of skin and subcutaneous tissue Acute x1 week. Infectious appearing. No systemic symptoms. No drainage. Concern for MRSA. Patient with hx of skin abscesses. Has large pannus that is contributory. Will treat with course of antibiotic with return precautions.  -Doxy 100 mg daily BID x7 days  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[place]) Requires use of assistive devices for ADL due to lack of mobility. DME order for shower chair and chucks pads placed. Needs chucks pads due to use of bed pan and patient constantly wets her bed using the bed pan.    Gerlene Fee, [place]

## 2020-10-20 ENCOUNTER — Other Ambulatory Visit: Payer: Self-pay | Admitting: Family Medicine

## 2020-10-20 ENCOUNTER — Telehealth: Payer: Self-pay

## 2020-10-20 ENCOUNTER — Ambulatory Visit (INDEPENDENT_AMBULATORY_CARE_PROVIDER_SITE_OTHER): Payer: Medicaid Other | Admitting: Family Medicine

## 2020-10-20 ENCOUNTER — Other Ambulatory Visit: Payer: Self-pay

## 2020-10-20 VITALS — BP 130/82 | HR 98 | Temp 98.3°F | Wt 356.2 lb

## 2020-10-20 DIAGNOSIS — R32 Unspecified urinary incontinence: Secondary | ICD-10-CM

## 2020-10-20 DIAGNOSIS — Z89512 Acquired absence of left leg below knee: Secondary | ICD-10-CM | POA: Diagnosis not present

## 2020-10-20 DIAGNOSIS — L0291 Cutaneous abscess, unspecified: Secondary | ICD-10-CM

## 2020-10-20 DIAGNOSIS — L02818 Cutaneous abscess of other sites: Secondary | ICD-10-CM | POA: Diagnosis not present

## 2020-10-20 MED ORDER — DOXYCYCLINE HYCLATE 100 MG PO TABS: 100.0000 mg | ORAL_TABLET | Freq: Two times a day (BID) | ORAL | 0 refills | Status: AC

## 2020-10-20 MED ORDER — HYDROCODONE-ACETAMINOPHEN 10-325 MG PO TABS: ORAL_TABLET | ORAL | 0 refills | Status: DC

## 2020-10-20 NOTE — Progress Notes (Signed)
Patient here for visit with Dr. Janus Molder. Reports finished last dose of pain medication last night, needs refilled. She has an appointment with me tomorrow morning.  Reviewed PDMP with appropriate findings. Last fill 10/15. No Rx's on file.   Will refill.  [name], MD

## 2020-10-20 NOTE — Patient Instructions (Signed)
It was wonderful to see you today.  Please bring ALL of your medications with you to every visit.   Today we talked about:  You inguinal abscess and concern for MRSA infection. Continue to keep area dry with guaze. We prescribed doxycycline for 7 days.   Your order for your shower chair and chuck pads were placed.   You are scheduled to see Dr. [name] tomorrow at 9:10am!  Please call the clinic at [phone] if your symptoms worsen or you have any concerns. It was our pleasure to serve you.  Dr. Janus Molder

## 2020-10-20 NOTE — Telephone Encounter (Signed)
Received message requesting DME supplies for patient.   Contacted Stephen at [employer]. Please include a primary diagnosis that speaks to need for incontinence supplies. I will also need to fax over the completed office visit note, once completed.   Community message sent to [facility] for shower chair. Will await response.   Talbot Grumbling, RN

## 2020-10-20 NOTE — Telephone Encounter (Signed)
Received below message from [employer] regarding shower chair.   Talbot Grumbling, RN

## 2020-10-21 ENCOUNTER — Ambulatory Visit: Payer: Medicaid Other | Admitting: Family Medicine

## 2020-10-22 ENCOUNTER — Ambulatory Visit: Payer: Medicaid Other | Admitting: Cardiovascular Disease

## 2020-10-22 NOTE — Progress Notes (Deleted)
Cardiology Office Note:    Date:  10/22/2020   ID:  [name], DOB 08/21/1976, MRN [id number]  PCP:  [name], MD  [place] Cardiologist:  Sherren Mocha, MD  [place] Electrophysiologist:  None   Referring MD: [name], MD   No chief complaint on file. ***  History of Present Illness:    [name] is a 44 y.o. female with a hx of:  Normal coronary arteries by cath in 2013  Morbid obesity   Diabetes mellitus, uncontrolled w/ neuropathy  ? Hx of diabetic foot ulcers  S/p L BKA  S/p R trans-met amputation  Asthma/COPD   Bipolar d/o  Hyperlipidemia   Hypertension   OSA   The patient had a Myoview stress test earlier this year which was negative for ischemia.  She has had some episodes of supraventricular tachycardia.  She has been treated for chronic diastolic heart failure treated with torsemide.  She was recently hospitalized October 10, 2020 with atypical chest pain.  High-sensitivity troponin was minimally elevated with a flat trend of 20 and 17.  Her D-dimer was negative.  Chest pain resolved and she was discharged home after a short observation stay.  Pertinent labs from her hospitalization showed continued evidence of uncontrolled diabetes with hemoglobin A1c 12.7%.  Past Medical History:  Diagnosis Date  . Acute osteomyelitis of ankle or foot, left ([place]) 01/31/2018  . Alveolar hypoventilation   . Anemia    not on iron pill  . Asthma   . Bipolar 2 disorder ([place])   . Carpal tunnel syndrome on right    recurrent  . Cellulitis 08/2010-08/2011  . Chronic pain   . COPD (chronic obstructive pulmonary disease) ([name])    Symbicort daily and Proventil as needed  . Costochondritis   . Diabetes mellitus type II, uncontrolled ([place]) 2000   Type 2, Uncontrolled.Takes Lantus daily.Fasting blood sugar runs 150  . Drug-seeking behavior   . GERD (gastroesophageal reflux disease)    takes Pantoprazole and Zantac daily  . HLD  (hyperlipidemia)    takes Atorvastatin daily  . Hypertension    takes Lisinopril and Coreg daily  . Morbid obesity ([place])   . Nocturia   . OSA on CPAP   . Peripheral neuropathy    takes Gabapentin daily  . Pneumonia    "walking" several yrs ago and as a baby (12/05/2018)  . Rectal fissure   . Restless leg   . SVT (supraventricular tachycardia) ([place])   . Syncope 02/25/2016  . Urinary frequency   . Varicose veins    Right medial thigh and Left leg     Past Surgical History:  Procedure Laterality Date  . AMPUTATION Left 02/01/2018   Procedure: LEFT FOURTH AND 5TH TOE RAY AMPUTATION;  Surgeon: [name], MD;  Location: [facility];  Service: Orthopedics;  Laterality: Left;  . AMPUTATION Left 03/03/2018   Procedure: LEFT BELOW KNEE AMPUTATION;  Surgeon: [name], MD;  Location: [facility];  Service: Orthopedics;  Laterality: Left;  . CARPAL TUNNEL RELEASE Bilateral   . CESAREAN SECTION  2007  . INCISION AND DRAINAGE PERIRECTAL ABSCESS Left 05/18/2019   Procedure: IRRIGATION AND DEBRIDEMENT OF PANNIS ABSCESS, POSSIBLE DEBRIDEMENT OF BUTTOCK WOUND;  Surgeon: Donnie Mesa, MD;  Location: [facility];  Service: General;  Laterality: Left;  . IRRIGATION AND DEBRIDEMENT BUTTOCKS Left 05/17/2019   Procedure: IRRIGATION AND DEBRIDEMENT BUTTOCKS;  Surgeon: Donnie Mesa, MD;  Location: [facility];  Service: General;  Laterality: Left;  .  KNEE ARTHROSCOPY Right 07/17/2010  . LEFT HEART CATHETERIZATION WITH CORONARY ANGIOGRAM N/A 07/27/2012   Procedure: LEFT HEART CATHETERIZATION WITH CORONARY ANGIOGRAM;  Surgeon: Sherren Mocha, MD;  [facility] LAB;  Service: Cardiovascular;  Laterality: N/A;  . MASS EXCISION N/A 06/29/2013   Procedure:  WIDE LOCAL EXCISION OF POSTERIOR NECK ABSCESS;  Surgeon: Ralene Ok, MD;  Location: [facility];  Service: General;  Laterality: N/A;  . REPAIR KNEE LIGAMENT Left    "fixed ligaments and chipped patella"  . right transmetatarsal amputation       Current  Medications: No outpatient medications have been marked as taking for the 10/22/20 encounter (Appointment) with Sherren Mocha, MD.     Allergies:   Cefepime, Kiwi extract, Morphine and related, Trental [pentoxifylline], and Nubain [nalbuphine hcl]   Social History   Socioeconomic History  . Marital status: Married    Spouse name: Not on file  . Number of children: Not on file  . Years of education: Not on file  . Highest education level: Not on file  Occupational History  . Not on file  Tobacco Use  . Smoking status: Former Smoker    Packs/day: 0.25    Years: 15.00    Pack years: 3.75    Types: Cigarettes    Quit date: 12/06/2005    Years since quitting: 14.8  . Smokeless tobacco: Never Used  . Tobacco comment: smokes for a couple of months  Vaping Use  . Vaping Use: Never used  Substance and Sexual Activity  . Alcohol use: No    Alcohol/week: 0.0 standard drinks  . Drug use: Yes    Comment: OD attempts on home meds    . Sexual activity: Yes    Partners: Male  Other Topics Concern  . Not on file  Social History Narrative   Lives in [place] with her fiance and 42 yr old dtr.   Social Determinants of Health   Financial Resource Strain:   . Difficulty of Paying Living Expenses: Not on file  Food Insecurity:   . Worried About Charity fundraiser in the Last Year: Not on file  . Ran Out of Food in the Last Year: Not on file  Transportation Needs:   . Lack of Transportation (Medical): Not on file  . Lack of Transportation (Non-Medical): Not on file  Physical Activity:   . Days of Exercise per Week: Not on file  . Minutes of Exercise per Session: Not on file  Stress:   . Feeling of Stress : Not on file  Social Connections:   . Frequency of Communication with Friends and Family: Not on file  . Frequency of Social Gatherings with Friends and Family: Not on file  . Attends Religious Services: Not on file  . Active Member of Clubs or Organizations: Not on file  . Attends  Archivist Meetings: Not on file  . Marital Status: Not on file     Family History: The patient's ***family history includes Allergic rhinitis in her mother; Anxiety disorder in her sister; Asthma in her child; Cancer in her maternal grandmother; Depression in her mother; Diabetes in her maternal grandmother and mother; GER disease in her mother; Heart attack in her father, paternal grandfather, paternal grandmother, and paternal uncle; Heart disease in her paternal grandfather and paternal grandmother; Hyperlipidemia in her maternal grandmother and mother; Hypertension in her maternal grandmother; Migraines in her sister; Other in her maternal grandfather; Restless legs syndrome in her mother.  ROS:  Please see the history of present illness.    *** All other systems reviewed and are negative.  EKGs/Labs/Other Studies Reviewed:    The following studies were reviewed today: ***  EKG:  EKG is *** ordered today.  The ekg ordered today demonstrates ***  Recent Labs: 06/17/2020: TSH 2.852 10/09/2020: ALT 21; B Natriuretic Peptide 41.5 10/10/2020: BUN 27; Creatinine, Ser 1.14; Hemoglobin 11.5; Platelets 331; Potassium 4.2; Sodium 134  Recent Lipid Panel    Component Value Date/Time   CHOL 274 (H) 04/10/2020 1200   TRIG 324 (H) 04/10/2020 1200   HDL 39 (L) 04/10/2020 1200   CHOLHDL 7.0 (H) 04/10/2020 1200   CHOLHDL 6.3 (H) 01/11/2017 0909   VLDL 52 (H) 01/11/2017 0909   LDLCALC 173 (H) 04/10/2020 1200   LDLDIRECT 164.9 11/15/2014 1148     Risk Assessment/Calculations:   {Does this patient have ATRIAL FIBRILLATION?:[phone]}   Physical Exam:    VS:  There were no vitals taken for this visit.    Wt Readings from Last 3 Encounters:  10/20/20 (!) 356 lb 3.2 oz (161.6 kg)  10/09/20 (!) 362 lb (164.2 kg)  09/09/20 (!) 362 lb (164.2 kg)     GEN: *** Well nourished, well developed in no acute distress HEENT: Normal NECK: No JVD; No carotid bruits LYMPHATICS: No  lymphadenopathy CARDIAC: ***RRR, no murmurs, rubs, gallops RESPIRATORY:  Clear to auscultation without rales, wheezing or rhonchi  ABDOMEN: Soft, non-tender, non-distended MUSCULOSKELETAL:  No edema; No deformity  SKIN: Warm and dry NEUROLOGIC:  Alert and oriented x 3 PSYCHIATRIC:  Normal affect   ASSESSMENT:    1. Chronic diastolic heart failure ([place])   2. SVT (supraventricular tachycardia) ([name])    PLAN:    In order of problems listed above:  1. ***    Shared Decision Making/Informed Consent   {Are you ordering a CV Procedure (e.g. stress test, cath, DCCV, TEE, etc)?   Press F2        :[id number]}    Medication Adjustments/Labs and Tests Ordered: Current medicines are reviewed at length with the patient today.  Concerns regarding medicines are outlined above.  No orders of the defined types were placed in this encounter.  No orders of the defined types were placed in this encounter.   There are no Patient Instructions on file for this visit.   Signed, Sherren Mocha, MD  10/22/2020 8:58 AM    [place]

## 2020-10-23 ENCOUNTER — Other Ambulatory Visit: Payer: Self-pay

## 2020-10-23 ENCOUNTER — Encounter: Payer: Self-pay | Admitting: Family Medicine

## 2020-10-23 ENCOUNTER — Encounter (HOSPITAL_BASED_OUTPATIENT_CLINIC_OR_DEPARTMENT_OTHER): Payer: Self-pay

## 2020-10-23 ENCOUNTER — Encounter (HOSPITAL_BASED_OUTPATIENT_CLINIC_OR_DEPARTMENT_OTHER): Payer: Medicaid Other | Admitting: Internal Medicine

## 2020-10-23 DIAGNOSIS — R32 Unspecified urinary incontinence: Secondary | ICD-10-CM

## 2020-10-23 HISTORY — DX: Unspecified urinary incontinence: R32

## 2020-10-23 NOTE — Assessment & Plan Note (Signed)
Acute x1 week. Infectious appearing. No systemic symptoms. No drainage. Concern for MRSA. Patient with hx of skin abscesses. Has large pannus that is contributory. Will treat with course of antibiotic with return precautions.  -Doxy 100 mg daily BID x7 days

## 2020-10-23 NOTE — Assessment & Plan Note (Addendum)
Requires use of assistive devices for ADL due to lack of mobility. DME order for shower chair and chucks pads placed. Needs chucks pads due to use of bed pan and patient constantly wets her bed using the bed pan.

## 2020-10-24 NOTE — Telephone Encounter (Signed)
Office note is complete. Will put [employer] order form in RN Triage box today.   Sheretha Shadd Autry-Lott, DO 10/24/2020, 7:19 AM PGY-2, [place]

## 2020-10-24 NOTE — Telephone Encounter (Signed)
Sent request electronically to Annie Main at [facility]. Will keep hard copy in file in RN room.   Talbot Grumbling, RN

## 2020-10-28 ENCOUNTER — Telehealth: Payer: Self-pay

## 2020-10-28 DIAGNOSIS — Z89512 Acquired absence of left leg below knee: Secondary | ICD-10-CM

## 2020-10-28 NOTE — Telephone Encounter (Signed)
Patient calls nurse line stating she received her shower chair, however it is not what she needed. Patient reports she returned it back to [facility] and they suggested a transfer bench for her needs. Patient is also requesting a gel overlay for her mattress. Please place DME orders and RN team can process. Patient reminded coming up PCP apt.

## 2020-10-29 MED ORDER — TRANSFER BENCH MISC: 0 refills | Status: DC

## 2020-10-29 NOTE — Telephone Encounter (Signed)
Orders entered, thanks [name], MD

## 2020-11-01 ENCOUNTER — Encounter (HOSPITAL_COMMUNITY): Payer: Self-pay

## 2020-11-01 ENCOUNTER — Other Ambulatory Visit: Payer: Self-pay

## 2020-11-01 ENCOUNTER — Emergency Department (HOSPITAL_COMMUNITY)
Admission: EM | Admit: 2020-11-01 | Discharge: 2020-11-01 | Disposition: A | Discharge status: Home / Self Care | Payer: Medicaid Other | Attending: Emergency Medicine | Admitting: Emergency Medicine

## 2020-11-01 ENCOUNTER — Emergency Department (HOSPITAL_COMMUNITY): Payer: Medicaid Other

## 2020-11-01 DIAGNOSIS — Z20822 Contact with and (suspected) exposure to covid-19: Secondary | ICD-10-CM | POA: Diagnosis not present

## 2020-11-01 DIAGNOSIS — R072 Precordial pain: Secondary | ICD-10-CM

## 2020-11-01 DIAGNOSIS — J449 Chronic obstructive pulmonary disease, unspecified: Secondary | ICD-10-CM | POA: Diagnosis not present

## 2020-11-01 DIAGNOSIS — I10 Essential (primary) hypertension: Secondary | ICD-10-CM | POA: Insufficient documentation

## 2020-11-01 DIAGNOSIS — Z8719 Personal history of other diseases of the digestive system: Secondary | ICD-10-CM | POA: Diagnosis not present

## 2020-11-01 DIAGNOSIS — R079 Chest pain, unspecified: Secondary | ICD-10-CM | POA: Insufficient documentation

## 2020-11-01 DIAGNOSIS — Z794 Long term (current) use of insulin: Secondary | ICD-10-CM | POA: Insufficient documentation

## 2020-11-01 DIAGNOSIS — Z87891 Personal history of nicotine dependence: Secondary | ICD-10-CM | POA: Diagnosis not present

## 2020-11-01 DIAGNOSIS — Z79899 Other long term (current) drug therapy: Secondary | ICD-10-CM | POA: Diagnosis not present

## 2020-11-01 DIAGNOSIS — I509 Heart failure, unspecified: Secondary | ICD-10-CM | POA: Diagnosis not present

## 2020-11-01 DIAGNOSIS — R0682 Tachypnea, not elsewhere classified: Secondary | ICD-10-CM | POA: Insufficient documentation

## 2020-11-01 DIAGNOSIS — E1140 Type 2 diabetes mellitus with diabetic neuropathy, unspecified: Secondary | ICD-10-CM | POA: Insufficient documentation

## 2020-11-01 DIAGNOSIS — R5383 Other fatigue: Secondary | ICD-10-CM | POA: Insufficient documentation

## 2020-11-01 DIAGNOSIS — E1165 Type 2 diabetes mellitus with hyperglycemia: Secondary | ICD-10-CM | POA: Insufficient documentation

## 2020-11-01 DIAGNOSIS — Z7951 Long term (current) use of inhaled steroids: Secondary | ICD-10-CM | POA: Insufficient documentation

## 2020-11-01 DIAGNOSIS — Z7982 Long term (current) use of aspirin: Secondary | ICD-10-CM | POA: Insufficient documentation

## 2020-11-01 DIAGNOSIS — R600 Localized edema: Secondary | ICD-10-CM | POA: Diagnosis not present

## 2020-11-01 DIAGNOSIS — R0789 Other chest pain: Secondary | ICD-10-CM | POA: Diagnosis not present

## 2020-11-01 DIAGNOSIS — R0602 Shortness of breath: Secondary | ICD-10-CM | POA: Diagnosis not present

## 2020-11-01 DIAGNOSIS — R52 Pain, unspecified: Secondary | ICD-10-CM | POA: Diagnosis not present

## 2020-11-01 LAB — CBC
HCT: 35.8 % — ABNORMAL LOW (ref 36.0–46.0)
Hemoglobin: 11.1 g/dL — ABNORMAL LOW (ref 12.0–15.0)
MCH: 29 pg (ref 26.0–34.0)
MCHC: 31 g/dL (ref 30.0–36.0)
MCV: 93.5 fL (ref 80.0–100.0)
Platelets: 300 10*3/uL (ref 150–400)
RBC: 3.83 MIL/uL — ABNORMAL LOW (ref 3.87–5.11)
RDW: 13.5 % (ref 11.5–15.5)
WBC: 9.4 10*3/uL (ref 4.0–10.5)
nRBC: 0 % (ref 0.0–0.2)

## 2020-11-01 LAB — BASIC METABOLIC PANEL
Anion gap: 11 (ref 5–15)
BUN: 34 mg/dL — ABNORMAL HIGH (ref 6–20)
CO2: 26 mmol/L (ref 22–32)
Calcium: 8.5 mg/dL — ABNORMAL LOW (ref 8.9–10.3)
Chloride: 99 mmol/L (ref 98–111)
Creatinine, Ser: 1.32 mg/dL — ABNORMAL HIGH (ref 0.44–1.00)
GFR, Estimated: 52 mL/min — ABNORMAL LOW (ref >60)
Glucose, Bld: 403 mg/dL — ABNORMAL HIGH (ref 70–99)
Potassium: 4.5 mmol/L (ref 3.5–5.1)
Sodium: 136 mmol/L (ref 135–145)

## 2020-11-01 LAB — TROPONIN I (HIGH SENSITIVITY)
Troponin I (High Sensitivity): 10 ng/L (ref <18)
Troponin I (High Sensitivity): 11 ng/L (ref <18)

## 2020-11-01 LAB — HEPATIC FUNCTION PANEL
ALT: 21 U/L (ref 0–44)
AST: 15 U/L (ref 15–41)
Albumin: 2.7 g/dL — ABNORMAL LOW (ref 3.5–5.0)
Alkaline Phosphatase: 110 U/L (ref 38–126)
Bilirubin, Direct: <0.1 mg/dL (ref 0.0–0.2)
Total Bilirubin: 0.3 mg/dL (ref 0.3–1.2)
Total Protein: 6.9 g/dL (ref 6.5–8.1)

## 2020-11-01 LAB — RESP PANEL BY RT-PCR (FLU A&B, COVID) ARPGX2
Influenza A by PCR: NEGATIVE
Influenza B by PCR: NEGATIVE
SARS Coronavirus 2 by RT PCR: NEGATIVE

## 2020-11-01 LAB — I-STAT BETA HCG BLOOD, ED (MC, WL, AP ONLY): I-stat hCG, quantitative: <5 m[IU]/mL (ref <5)

## 2020-11-01 LAB — LIPASE, BLOOD: Lipase: 56 U/L — ABNORMAL HIGH (ref 11–51)

## 2020-11-01 LAB — BRAIN NATRIURETIC PEPTIDE: B Natriuretic Peptide: 49.1 pg/mL (ref 0.0–100.0)

## 2020-11-01 LAB — D-DIMER, QUANTITATIVE: D-Dimer, Quant: 0.33 ug/mL-FEU (ref 0.00–0.50)

## 2020-11-01 MED ORDER — ACETAMINOPHEN 500 MG PO TABS
1000.0000 mg | ORAL_TABLET | Freq: Once | ORAL | Status: AC
  Administered 2020-11-01: 1000 mg via ORAL
  Filled 2020-11-01: qty 2

## 2020-11-01 MED ORDER — ALBUTEROL SULFATE HFA 108 (90 BASE) MCG/ACT IN AERS
4.0000 | INHALATION_SPRAY | Freq: Once | RESPIRATORY_TRACT | Status: AC
  Administered 2020-11-01: 4 via RESPIRATORY_TRACT
  Filled 2020-11-01: qty 6.7

## 2020-11-01 MED ORDER — FENTANYL CITRATE (PF) 100 MCG/2ML IJ SOLN
50.0000 ug | Freq: Once | INTRAMUSCULAR | Status: AC
  Administered 2020-11-01: 50 ug via INTRAVENOUS
  Filled 2020-11-01: qty 2

## 2020-11-01 NOTE — ED Provider Notes (Signed)
[facility] Provider Note   CSN: [id number] Arrival date & time: 11/01/20  1700     History Chief Complaint  Patient presents with  . Chest Pain    [name] is a 44 y.o. female with past medical history of diabetes, obesity, hypertension, hyperlipidemia, sleep apnea presents with chest pain shortness of breath.  She states that her shortness of breath has been worsening over the past several days but she started having chest pain that started around 4 AM this morning while she was sleeping.  It was waxing and waning but starting this afternoon, was more constant and worsening.  No previously symptoms.  Exertional and positional.  Received 324 mg aspirin in route with EMS.  The history is provided by the patient.  Chest Pain Pain location:  L chest Pain quality: throbbing   Pain radiates to:  Does not radiate Pain severity:  Moderate Onset quality:  Sudden Duration:  13 hours Timing:  Constant Progression:  Worsening Chronicity:  New Relieved by:  Certain positions and rest Worsened by:  Certain positions, exertion and movement Ineffective treatments:  Leaning forward and rest Associated symptoms: fatigue and shortness of breath   Associated symptoms: no abdominal pain, no back pain, no cough, no diaphoresis, no fever, no headache, no lower extremity edema, no nausea, no numbness, no palpitations, no syncope and no vomiting   Risk factors: hypertension and obesity   Risk factors: no coronary artery disease, no immobilization, no prior DVT/PE and no smoking        Past Medical History:  Diagnosis Date  . Acute osteomyelitis of ankle or foot, left ([place]) 01/31/2018  . Alveolar hypoventilation   . Anemia    not on iron pill  . Asthma   . Bipolar 2 disorder ([place])   . Carpal tunnel syndrome on right    recurrent  . Cellulitis 08/2010-08/2011  . Chronic pain   . COPD (chronic obstructive pulmonary disease) ([name])    Symbicort daily and  Proventil as needed  . Costochondritis   . Diabetes mellitus type II, uncontrolled ([place]) 2000   Type 2, Uncontrolled.Takes Lantus daily.Fasting blood sugar runs 150  . Drug-seeking behavior   . GERD (gastroesophageal reflux disease)    takes Pantoprazole and Zantac daily  . HLD (hyperlipidemia)    takes Atorvastatin daily  . Hypertension    takes Lisinopril and Coreg daily  . Morbid obesity ([place])   . Nocturia   . OSA on CPAP   . Peripheral neuropathy    takes Gabapentin daily  . Pneumonia    "walking" several yrs ago and as a baby (12/05/2018)  . Rectal fissure   . Restless leg   . SVT (supraventricular tachycardia) ([place])   . Syncope 02/25/2016  . Urinary frequency   . Urinary incontinence 10/23/2020  . Varicose veins    Right medial thigh and Left leg     Patient Active Problem List   Diagnosis Date Noted  . Urinary incontinence 10/23/2020  . Elevated troponin   . Acute respiratory failure with hypoxia and hypercarbia ([place]) 06/15/2020  . Acute respiratory failure with hypoxia ([place]) 06/14/2020  . Blister of foot 04/21/2020  . Exposure to mold 04/21/2020  . Constipation 04/03/2020  . Left shoulder pain 06/23/2019  . Hyperglycemia due to diabetes mellitus ([place])   . 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[place]) 04/11/2019  . Allergic rhinitis 03/06/2018  . Class 3 severe obesity due to excess calories with serious comorbidity and body mass  index (BMI) of 50.0 to 59.9 in adult [facility])   . Decreased pedal pulses 06/10/2017  . Unilateral primary osteoarthritis, right knee 10/22/2016  . Primary osteoarthritis of first carpometacarpal joint of left hand 07/30/2016  . Diabetic neuropathy ([place]) 07/14/2016  . Nausea 03/17/2016  . De Quervain's tenosynovitis, bilateral 11/01/2015  . Vitamin D deficiency 09/05/2015  . Recurrent candidiasis of vagina 09/05/2015  . Varicose veins of leg with complications 99/24/2683  . Restless leg syndrome 10/17/2014  . Chronic sinusitis 07/18/2014  . Encounter  for chronic pain management 06/30/2013  . HLD (hyperlipidemia) 11/19/2012  . Nonspecific chest pain 06/27/2012  . Abscess of skin and subcutaneous tissue 11/04/2011  . Right carpal tunnel syndrome 09/01/2011  . Bilateral knee pain 09/01/2011  . DM (diabetes mellitus) type II uncontrolled, periph vascular disorder ([place]) 05/22/2008  . Morbid obesity ([place]) 05/22/2008  . Obesity hypoventilation syndrome ([place]) 05/22/2008  . Depression 05/22/2008  . Obstructive sleep apnea 05/22/2008  . Hypertension 05/22/2008  . Asthma 05/22/2008  . GERD 05/22/2008    Past Surgical History:  Procedure Laterality Date  . AMPUTATION Left 02/01/2018   Procedure: LEFT FOURTH AND 5TH TOE RAY AMPUTATION;  Surgeon: [name], MD;  Location: [place];  Service: Orthopedics;  Laterality: Left;  . AMPUTATION Left 03/03/2018   Procedure: LEFT BELOW KNEE AMPUTATION;  Surgeon: [name], MD;  Location: [place];  Service: Orthopedics;  Laterality: Left;  . CARPAL TUNNEL RELEASE Bilateral   . CESAREAN SECTION  2007  . INCISION AND DRAINAGE PERIRECTAL ABSCESS Left 05/18/2019   Procedure: IRRIGATION AND DEBRIDEMENT OF PANNIS ABSCESS, POSSIBLE DEBRIDEMENT OF BUTTOCK WOUND;  Surgeon: Donnie Mesa, MD;  Location: [place];  Service: General;  Laterality: Left;  . IRRIGATION AND DEBRIDEMENT BUTTOCKS Left 05/17/2019   Procedure: IRRIGATION AND DEBRIDEMENT BUTTOCKS;  Surgeon: Donnie Mesa, MD;  Location: [place];  Service: General;  Laterality: Left;  . KNEE ARTHROSCOPY Right 07/17/2010  . LEFT HEART CATHETERIZATION WITH CORONARY ANGIOGRAM N/A 07/27/2012   Procedure: LEFT HEART CATHETERIZATION WITH CORONARY ANGIOGRAM;  Surgeon: Sherren Mocha, MD;  Location: [facility] CATH LAB;  Service: Cardiovascular;  Laterality: N/A;  . MASS EXCISION N/A 06/29/2013   Procedure:  WIDE LOCAL EXCISION OF POSTERIOR NECK ABSCESS;  Surgeon: Ralene Ok, MD;  Location: [place];  Service: General;  Laterality: N/A;  . REPAIR KNEE LIGAMENT Left    "fixed  ligaments and chipped patella"  . right transmetatarsal amputation        OB History    Gravida  2   Para  1   Term      Preterm      AB  1   Living  1     SAB  1   TAB      Ectopic      Multiple      Live Births              Family History  Problem Relation Age of Onset  . Diabetes Mother   . Hyperlipidemia Mother   . Depression Mother   . GER disease Mother   . Allergic rhinitis Mother   . Restless legs syndrome Mother   . Heart attack Paternal Uncle   . Heart disease Paternal Grandmother   . Heart attack Paternal Grandmother   . Heart attack Paternal Grandfather   . Heart disease Paternal Grandfather   . Heart attack Father   . Migraines Sister   . Cancer Maternal Grandmother  COLON  . Hypertension Maternal Grandmother   . Hyperlipidemia Maternal Grandmother   . Diabetes Maternal Grandmother   . Other Maternal Grandfather        GUN SHOT  . Anxiety disorder Sister   . Asthma Child     Social History   Tobacco Use  . Smoking status: Former Smoker    Packs/day: 0.25    Years: 15.00    Pack years: 3.75    Types: Cigarettes    Quit date: 12/06/2005    Years since quitting: 14.9  . Smokeless tobacco: Never Used  . Tobacco comment: smokes for a couple of months  Vaping Use  . Vaping Use: Never used  Substance Use Topics  . Alcohol use: No    Alcohol/week: 0.0 standard drinks  . Drug use: Yes    Comment: OD attempts on home meds      Home Medications Prior to Admission medications   Medication Sig Start Date End Date Taking? Authorizing Provider  Accu-Chek Softclix Lancets lancets Use to check blood glucose four times daily Patient not taking: Reported on 10/16/2020 04/17/20   Martyn Malay, MD  atorvastatin (LIPITOR) 40 MG tablet Take 1 tablet (40 mg total) by mouth daily. 04/11/20   [name], MD  budesonide-formoterol [facility]) 160-4.5 MCG/ACT inhaler Inhale 2 puffs into the lungs 2 (two) times daily. 05/09/20    [name], MD  Cholecalciferol 1000 units tablet Take 1 tablet (1,000 Units total) by mouth daily. 04/05/17   [name], MD  Compression Bandages MISC Wear daily for swelling 07/10/20   [name], MD  Continuous Blood Gluc Sensor (DEXCOM G6 SENSOR) MISC Inject 1 applicator into the skin as directed. Change sensor every 10 days. 10/16/20   Leavy Cella, RPH-CPP  Continuous Blood Gluc Transmit (DEXCOM G6 TRANSMITTER) MISC Inject 1 Device into the skin as directed. Reuse 8 times with sensor changes. 10/16/20   Leavy Cella, RPH-CPP  cyclobenzaprine (FLEXERIL) 5 MG tablet Take 5 mg by mouth 3 (three) times daily as needed. Patient not taking: Reported on 10/16/2020 09/03/20   [provider]  gabapentin (NEURONTIN) 600 MG tablet TAKE 2 TABLETS BY MOUTH 3 TIMES DAILY 10/20/20   [name], MD  glucose blood (ACCU-CHEK GUIDE) test strip USE T0 CHECK BLOOD SUGAR 3 TIMES DAILY Patient not taking: Reported on 10/16/2020 04/18/20   Leavy Cella, RPH-CPP  HYDROcodone-acetaminophen (NORCO) 10-325 MG tablet TAKE 1 TABLET BY MOUTH EVERY EIGHT HOURS AS NEEDED 10/20/20   [name], MD  insulin aspart (NOVOLOG) 100 UNIT/ML FlexPen Inject 20 Units into the skin 3 (three) times daily with meals. 10/16/20   Leavy Cella, RPH-CPP  insulin glargine (LANTUS SOLOSTAR) 100 UNIT/ML Solostar Pen Inject 70 Units into the skin 2 (two) times daily. 10/16/20   Leavy Cella, RPH-CPP  Insulin Pen Needle (SURE COMFORT PEN NEEDLES) 32G X 4 MM MISC Use to inject insulin as indicated 04/17/20   Martyn Malay, MD  ipratropium-albuterol (DUONEB) 0.5-2.5 (3) MG/3ML SOLN Take 3 mLs by nebulization every 4 (four) hours as needed. 06/25/20   [name], MD  Lancets (ACCU-CHEK MULTICLIX) lancets 1 each by Other route as needed for other. Use as instructed Patient not taking: Reported on 10/16/2020 04/18/20   Leavy Cella, RPH-CPP  Misc. Devices (TRANSFER BENCH) MISC  Use daily 10/29/20   [name], MD  potassium chloride SA (KLOR-CON M20) 20 MEQ tablet Take 1 tablet (20  mEq total) by mouth daily. 09/09/20   Richardson Dopp T, PA-C  rOPINIRole (REQUIP) 1 MG tablet Take 1 tablet (1 mg total) by mouth at bedtime. 08/12/20   [name], MD  torsemide (DEMADEX) 20 MG tablet Take 2 tablets (40 mg total) by mouth daily. 09/09/20   Weaver, Scott T, PA-C  VICTOZA 18 MG/3ML SOPN inject 0.6 MG SUBCUTANEOUSLY ONCE A DAY FOR 1 WEEK, THEN INCREASE TO 1.2 MG ONCE daily max 1.8 MG Patient taking differently: Inject 1.2 mg into the skin daily.  04/16/20   [name], MD    Allergies    Cefepime, Kiwi extract, Morphine and related, Trental [pentoxifylline], and Nubain [nalbuphine hcl]  Review of Systems   Review of Systems  Constitutional: Positive for fatigue. Negative for chills, diaphoresis and fever.  HENT: Negative for congestion, ear pain, rhinorrhea and sore throat.   Eyes: Negative for pain and visual disturbance.  Respiratory: Positive for shortness of breath. Negative for cough.   Cardiovascular: Positive for chest pain. Negative for palpitations, leg swelling and syncope.  Gastrointestinal: Positive for constipation. Negative for abdominal pain, diarrhea, nausea and vomiting.  Genitourinary: Positive for decreased urine volume. Negative for dysuria and hematuria.  Musculoskeletal: Negative for arthralgias and back pain.  Skin: Negative for color change and rash.  Neurological: Negative for numbness and headaches.  All other systems reviewed and are negative.   Physical Exam Updated Vital Signs BP 119/71   Pulse 96   Temp 98.8 F (37.1 C) (Oral)   Resp (!) 22   Ht 5\' 2"  (1.575 m)   Wt (!) 164.2 kg   SpO2 95%   BMI 66.21 kg/m   Physical Exam Vitals and nursing note reviewed.  Constitutional:      General: She is in acute distress (uncomfortable appearing).     Appearance: She is well-developed. She is obese. She is not  ill-appearing or toxic-appearing.  HENT:     Head: Normocephalic and atraumatic.  Eyes:     Conjunctiva/sclera: Conjunctivae normal.  Cardiovascular:     Rate and Rhythm: Normal rate and regular rhythm.     Pulses:          Radial pulses are 2+ on the right side and 2+ on the left side.     Heart sounds: No murmur heard.   Pulmonary:     Effort: Pulmonary effort is normal. Tachypnea present. No accessory muscle usage.     Breath sounds: No stridor. Decreased breath sounds (diffusely) present. No wheezing, rhonchi or rales.  Abdominal:     Palpations: Abdomen is soft.     Tenderness: There is no abdominal tenderness.  Musculoskeletal:     Cervical back: Neck supple.     Right lower leg: No tenderness. Edema present.     Comments: Left AKA, multiple toes amputated from right foot  Skin:    General: Skin is warm and dry.  Neurological:     Mental Status: She is alert and oriented to person, place, and time. Mental status is at baseline.     ED Results / Procedures / Treatments   Labs (all labs ordered are listed, but only abnormal results are displayed) Labs Reviewed - No data to display  EKG None  Radiology No results found.  Procedures Procedures (including critical care time)  Medications Ordered in ED Medications - No data to display  ED Course  I have reviewed the triage vital signs and the nursing notes.  Pertinent labs & imaging  results that were available during my care of the patient were reviewed by me and considered in my medical decision making (see chart for details).    MDM Rules/Calculators/A&P                          MDM: [name] is a 44 y.o. female who presents with chest pain as per above. I have reviewed the nursing documentation for past medical history, family history, and social history. Pertinent previous records reviewed. She is awake, alert.  Satting 94% on room air but otherwise HDS. Afebrile. Physical exam is most notable for  tachypneic, uncomfortable appearing 44 year old female.  Labs: Troponin negative x2, D-dimer negative, Covid negative, hCG negative, lipase 56, CBC and BMP unremarkable apart from glucose of 400 and mild creatinine elevation (but not an AKI).  LFTs unremarkable. EKG: NSR, Incomplete left bundle branch block, borderline right axis deviation. QTc, PR, and QRS within appropriate limits. No signs of acute ischemia, infarct, or significant electrical abnormalities. No STEMI, ST depressions, or significant T wave inversions. No evidence of a High-Grade Conduction Block, WPW, Brugada Sign, ARVC, DeWinters T Waves, or Wellens Waves. Imaging: CXR demonstrating low lung volumes but no acute cardiopulmonary abnormality Consults: none Tx: Given 50 mcg IV fentanyl, 4 puffs albuterol, 1000 mg p.o. Tylenol  Differential Dx: I am most concerned for musculoskeletal chest pain. Given history, physical exam, and work-up, I do not think she has ACS, PE, pneumothorax, pneumonia, esophageal pathology, CHF exacerbation, COPD exacerbation, PUD, bowel perforation, or trauma.  Patient is not in DKA and does not have an AKI.  MDM: [name] is a 44 y.o. female presents with left-sided chest pain.  She describes it as a throbbing sensation.  Given negative troponins and unremarkable EKG, doubt ACS.  Given negative D-dimer, low concern for PE with low pretest probability.  Given unremarkable work-up, history, and exam, do not feel that further ED work-up is indicated at this time.  Recommended close outpatient follow-up with patient's PCP.  Also discussed patient's hyperglycemia and importance of glucose control; recommended that she also speak to her PCP regarding this.  Patient voiced understanding of discharge instructions.  Strict return precautions provided. Questions were answered.  Patient discharged in stable condition.  The plan for this patient was discussed with Dr. Johnney Killian, who voiced agreement and who oversaw  evaluation and treatment of this patient.   Final Clinical Impression(s) / ED Diagnoses Final diagnoses:  None    Rx / DC Orders ED Discharge Orders    None       Bryauna Byrum, MD 11/02/20 5035    Charlesetta Shanks, MD 11/14/20 [phone]

## 2020-11-01 NOTE — ED Notes (Signed)
Ptar called 

## 2020-11-01 NOTE — ED Notes (Signed)
Pt was given food per RN orders.

## 2020-11-01 NOTE — Discharge Instructions (Signed)
You can take 1000 mg Tylenol every 6 hours for a max of 4000 mg/day for pain.  Can also try heat/ice, stretching, and massage.  Try to avoid Advil or other NSAIDs as it can affect your kidneys.  Follow-up with your primary care provider regarding your diabetes and glucose control as your sugar was 400 today.

## 2020-11-01 NOTE — ED Notes (Signed)
Pt req PTAR for dc home

## 2020-11-01 NOTE — ED Triage Notes (Signed)
Pt arrived via [facility] from home for 8/10 non radiating throbbing left sided chest pain, SOB w/exertion and lying flat started last night. Pt wears 2L 02 per [place] w/sleep at baseline since having RSV in July. Pt has labored breathing and is tachypneic. Pt is A&Ox4.

## 2020-11-01 NOTE — ED Notes (Signed)
Ptar cancelled 

## 2020-11-01 NOTE — ED Provider Notes (Signed)
I saw and evaluated the patient, reviewed the resident's note and I agree with the findings and plan.  EKG: EKG Interpretation  Date/Time:  Saturday November 01 2020 17:10:43 EST Ventricular Rate:  95 PR Interval:    QRS Duration: 103 QT Interval:  364 QTC Calculation: 458 R Axis:   90 Text Interpretation: Sinus rhythm Borderline right axis deviation Low voltage, precordial leads Consider anterior infarct no acute ischemic appearance, no sig change from old Confirmed by Charlesetta Shanks [phone]) on 11/01/2020 6:13:14 PM    Charlesetta Shanks, MD 11/14/20 [phone]

## 2020-11-02 DIAGNOSIS — I130 Hypertensive heart and chronic kidney disease with heart failure and stage 1 through stage 4 chronic kidney disease, or unspecified chronic kidney disease: Secondary | ICD-10-CM | POA: Diagnosis not present

## 2020-11-02 DIAGNOSIS — E1122 Type 2 diabetes mellitus with diabetic chronic kidney disease: Secondary | ICD-10-CM | POA: Diagnosis not present

## 2020-11-02 DIAGNOSIS — I5030 Unspecified diastolic (congestive) heart failure: Secondary | ICD-10-CM | POA: Diagnosis not present

## 2020-11-02 DIAGNOSIS — R918 Other nonspecific abnormal finding of lung field: Secondary | ICD-10-CM | POA: Diagnosis not present

## 2020-11-02 DIAGNOSIS — J9811 Atelectasis: Secondary | ICD-10-CM | POA: Diagnosis not present

## 2020-11-02 DIAGNOSIS — R0602 Shortness of breath: Secondary | ICD-10-CM | POA: Diagnosis not present

## 2020-11-02 DIAGNOSIS — R079 Chest pain, unspecified: Secondary | ICD-10-CM | POA: Diagnosis not present

## 2020-11-02 DIAGNOSIS — E669 Obesity, unspecified: Secondary | ICD-10-CM | POA: Diagnosis not present

## 2020-11-02 DIAGNOSIS — N189 Chronic kidney disease, unspecified: Secondary | ICD-10-CM | POA: Diagnosis not present

## 2020-11-02 DIAGNOSIS — G4733 Obstructive sleep apnea (adult) (pediatric): Secondary | ICD-10-CM | POA: Diagnosis not present

## 2020-11-02 DIAGNOSIS — R0789 Other chest pain: Secondary | ICD-10-CM | POA: Diagnosis not present

## 2020-11-02 DIAGNOSIS — Z87891 Personal history of nicotine dependence: Secondary | ICD-10-CM | POA: Diagnosis not present

## 2020-11-02 DIAGNOSIS — E785 Hyperlipidemia, unspecified: Secondary | ICD-10-CM | POA: Diagnosis not present

## 2020-11-02 DIAGNOSIS — R7989 Other specified abnormal findings of blood chemistry: Secondary | ICD-10-CM | POA: Diagnosis not present

## 2020-11-04 ENCOUNTER — Telehealth: Payer: Self-pay | Admitting: Internal Medicine

## 2020-11-04 NOTE — Telephone Encounter (Signed)
Community message sent to [employer] for processing.

## 2020-11-04 NOTE — Telephone Encounter (Signed)
Spoke with pt, c/o increased sob and chest tightness X 1 week.  Denies cough, mucus production, fever, sharp chest pains. Denies any injury to area to cause discomfort.  Pt has been taking Symbicort to help with s/s.  Per chart, pt has duoneb that she can take q4h prn.  Pt states she took duoneb yesterday, it didn't help her s/s, and she refused to take any more neb treatments of this. Requesting additional recs.   Pharmacy[facility].    Sending to [facility] as Dr. Shearon Stalls is not in office.  Aaron Edelman please advise on recs.  Thanks!

## 2020-11-04 NOTE — Telephone Encounter (Signed)
Confirmed by [employer].

## 2020-11-04 NOTE — Telephone Encounter (Signed)
11/04/2020  Patient of Dr. Shearon Stalls last seen in September/2021.  This was for consult.  Plan of care from that office visit was as follows: Resume diuretics and reestablish with cardiology.    Per chart review does appear the patient did reestablish with cardiology and has been seen in [place] emergency room and [facility] emergency room.  I would recommend the patient have a physical in person evaluation either by our office, primary care or cardiology given her increase shortness of breath as well as chest tightness.  This cannot be managed telephonically over the phone.  We will also not manage this with prednisone.  Would also recommend the patient be scheduled for an in person evaluation with Dr. Shearon Stalls as she is due for follow-up in January/2022.  If patient is unable to coordinate an in person evaluation at primary care, our office or cardiology and symptoms worsening she needs to seek emergent evaluation at urgent care or an emergency room.  Wyn Quaker, FNP

## 2020-11-04 NOTE — Telephone Encounter (Signed)
Spoke with pt, aware of recs.  Dr. Shearon Stalls has no openings until January.  Pt requesting to be seen this week.  Pt scheduled to see Beth this Friday at 10:00.  Nothing further needed at this time- will close encounter.

## 2020-11-05 DIAGNOSIS — E669 Obesity, unspecified: Secondary | ICD-10-CM | POA: Diagnosis not present

## 2020-11-05 DIAGNOSIS — G4733 Obstructive sleep apnea (adult) (pediatric): Secondary | ICD-10-CM | POA: Diagnosis not present

## 2020-11-05 DIAGNOSIS — M86172 Other acute osteomyelitis, left ankle and foot: Secondary | ICD-10-CM | POA: Diagnosis not present

## 2020-11-05 DIAGNOSIS — J45909 Unspecified asthma, uncomplicated: Secondary | ICD-10-CM | POA: Diagnosis not present

## 2020-11-06 ENCOUNTER — Encounter (HOSPITAL_BASED_OUTPATIENT_CLINIC_OR_DEPARTMENT_OTHER): Payer: Medicaid Other | Attending: Internal Medicine | Admitting: Internal Medicine

## 2020-11-06 ENCOUNTER — Other Ambulatory Visit: Payer: Self-pay

## 2020-11-06 DIAGNOSIS — E1151 Type 2 diabetes mellitus with diabetic peripheral angiopathy without gangrene: Secondary | ICD-10-CM | POA: Insufficient documentation

## 2020-11-06 DIAGNOSIS — I5030 Unspecified diastolic (congestive) heart failure: Secondary | ICD-10-CM | POA: Diagnosis not present

## 2020-11-06 DIAGNOSIS — I110 Hypertensive heart disease with heart failure: Secondary | ICD-10-CM | POA: Diagnosis not present

## 2020-11-06 DIAGNOSIS — I890 Lymphedema, not elsewhere classified: Secondary | ICD-10-CM | POA: Diagnosis not present

## 2020-11-06 DIAGNOSIS — E11621 Type 2 diabetes mellitus with foot ulcer: Secondary | ICD-10-CM | POA: Insufficient documentation

## 2020-11-06 DIAGNOSIS — L97512 Non-pressure chronic ulcer of other part of right foot with fat layer exposed: Secondary | ICD-10-CM | POA: Diagnosis not present

## 2020-11-06 DIAGNOSIS — E1140 Type 2 diabetes mellitus with diabetic neuropathy, unspecified: Secondary | ICD-10-CM | POA: Diagnosis not present

## 2020-11-06 NOTE — Progress Notes (Signed)
[name], [name] ([id number]) Visit Report for 11/06/2020 Arrival Information Details Patient Name: Date of Service: [name] [facility] 11/06/2020 10:15 A M Medical Record Number: [id number] Patient Account Number: [id number] Date of Birth/Sex: Treating RN: [birth year] (44 y.o. Orvan Falconer Primary Care Shanika Levings: [name] Other Clinician: Referring Trissa Molina: Treating Adryel Wortmann/Extender: Mammie Russian, Ivan Anchors in Treatment: 6 Visit Information History Since Last Visit All ordered tests and consults were completed: No Patient Arrived: Wheel Chair Added or deleted any medications: No Arrival Time: 10:44 Any new allergies or adverse reactions: No Accompanied By: daughter Had a fall or experienced change in No Transfer Assistance: None activities of daily living that may affect Patient Identification Verified: Yes risk of falls: Secondary Verification Process Completed: Yes Signs or symptoms of abuse/neglect since last visito No Patient Requires Transmission-Based Precautions: No Hospitalized since last visit: No Patient Has Alerts: No Implantable device outside of the clinic excluding No cellular tissue based products placed in the center since last visit: Has Dressing in Place as Prescribed: Yes Has Compression in Place as Prescribed: Yes Pain Present Now: No Electronic Signature(s) Signed: 11/06/2020 4:55:20 PM By: Carlene Coria RN Entered By: Carlene Coria on 11/06/2020 10:45:49 -------------------------------------------------------------------------------- Encounter Discharge Information Details Patient Name: Date of Service: [name], Belinda [facility] 11/06/2020 10:15 A M Medical Record Number: [id number] Patient Account Number: [id number] Date of Birth/Sex: Treating RN: February 18, 1976 (44 y.o. [name] Primary Care Siria Calandro: [name] Other Clinician: Referring Grady Mohabir: Treating Athenia Rys/Extender: Mammie Russian, Ivan Anchors in Treatment:  6 Encounter Discharge Information Items Post Procedure Vitals Discharge Condition: Stable Temperature (F): 98 Ambulatory Status: Wheelchair Pulse (bpm): 98 Discharge Destination: Home Respiratory Rate (breaths/min): 18 Transportation: Private Auto Blood Pressure (mmHg): 161/97 Accompanied By: friend, dgt Schedule Follow-up Appointment: Yes Clinical Summary of Care: Patient Declined Electronic Signature(s) Signed: 11/06/2020 5:52:01 PM By: Baruch Gouty RN, BSN Entered By: Baruch Gouty on 11/06/2020 11:58:24 -------------------------------------------------------------------------------- Lower Extremity Assessment Details Patient Name: Date of Service: [name] [facility] 11/06/2020 10:15 A M Medical Record Number: [id number] Patient Account Number: [id number] Date of Birth/Sex: Treating RN: 1976-04-16 (44 y.o. Orvan Falconer Primary Care Patricia Fargo: [name] Other Clinician: Referring Jaycee Pelzer: Treating Shauni Henner/Extender: Mammie Russian, Ivan Anchors in Treatment: 6 Edema Assessment Assessed: [Left: No] [Right: No] Edema: [Left: Ye] [Right: s] Calf Left: Right: Point of Measurement: From Medial Instep 47 cm Ankle Left: Right: Point of Measurement: From Medial Instep 24 cm Electronic Signature(s) Signed: 11/06/2020 4:55:20 PM By: Carlene Coria RN Entered By: Carlene Coria on 11/06/2020 10:50:10 -------------------------------------------------------------------------------- Multi Wound Chart Details Patient Name: Date of Service: [name], Earley Favor [facility] 11/06/2020 10:15 A M Medical Record Number: [id number] Patient Account Number: [id number] Date of Birth/Sex: Treating RN: Mar 28, 1976 (44 y.o. [name], [name] Primary Care Ophia Shamoon: [name] Other Clinician: Referring Kailah Pennel: Treating Zayd Bonet/Extender: Mammie Russian, Ivan Anchors in Treatment: 6 Vital Signs Height(in): 31 Pulse(bpm): 54 Weight(lbs): 65 Blood Pressure(mmHg):  161/97 Body Mass Index(BMI): 66 Temperature(F): 98 Respiratory Rate(breaths/min): 16 Photos: [4:No Photos Right Amputation Site -] [N/A:N/A N/A] Wound Location: [4:Transmetatarsal Gradually Appeared] [N/A:N/A] Wounding Event: [4:Diabetic Wound/Ulcer of the Lower] [N/A:N/A] Primary Etiology: [4:Extremity Asthma, Chronic Obstructive] [N/A:N/A] Comorbid History: [4:Pulmonary Disease (COPD), Sleep Apnea, Congestive Heart Failure, Hypertension, Peripheral Venous Disease, Type II Diabetes, Osteoarthritis, Osteomyelitis, Neuropathy 03/06/2020] [N/A:N/A] Date Acquired: [4:6] [N/A:N/A] Weeks of Treatment: [4:Open] [N/A:N/A] Wound Status: [4:1.5x0.8x0.4] [N/A:N/A] Measurements L x W x D (cm) [4:0.942] [N/A:N/A] A (cm) : rea [4:0.377] [N/A:N/A] Volume (cm) : [4:-9.00%] [N/A:N/A] % Reduction in A  rea: [4:-45.60%] [N/A:N/A] % Reduction in Volume: [4:Grade 1] [N/A:N/A] Classification: [4:Medium] [N/A:N/A] Exudate A mount: [4:Serosanguineous] [N/A:N/A] Exudate Type: [4:red, brown] [N/A:N/A] Exudate Color: [4:Distinct, outline attached] [N/A:N/A] Wound Margin: [4:Small (1-33%)] [N/A:N/A] Granulation A mount: [4:Pink] [N/A:N/A] Granulation Quality: [4:Large (67-100%)] [N/A:N/A] Necrotic A mount: [4:Fat Layer (Subcutaneous Tissue): Yes N/A] Exposed Structures: [4:Fascia: No Tendon: No Muscle: No Joint: No Bone: No Small (1-33%)] [N/A:N/A] Epithelialization: [4:Debridement - Excisional] [N/A:N/A] Debridement: Pre-procedure Verification/Time Out 11:30 [N/A:N/A] Taken: [4:Lidocaine 4% Topical Solution] [N/A:N/A] Pain Control: [4:Subcutaneous, Slough] [N/A:N/A] Tissue Debrided: [4:Skin/Subcutaneous Tissue] [N/A:N/A] Level: [4:1.2] [N/A:N/A] Debridement A (sq cm): [4:rea Curette] [N/A:N/A] Instrument: [4:Minimum] [N/A:N/A] Bleeding: [4:Pressure] [N/A:N/A] Hemostasis A chieved: [4:0] [N/A:N/A] Procedural Pain: [4:0] [N/A:N/A] Post Procedural Pain: [4:Procedure was tolerated well]  [N/A:N/A] Debridement Treatment Response: [4:1.5x0.8x0.4] [N/A:N/A] Post Debridement Measurements L x W x D (cm) [4:0.377] [N/A:N/A] Post Debridement Volume: (cm) [4:Debridement] [N/A:N/A] Treatment Notes Wound #4 (Amputation Site - Transmetatarsal) Wound Laterality: Right Cleanser Normal Saline Discharge Instruction: Cleanse the wound with Normal Saline prior to applying a clean dressing using gauze sponges, not tissue or cotton balls. Peri-Wound Care Topical Primary Dressing Hydrofera Blue Ready Foam, 4x5 in Discharge Instruction: Apply to wound bed as instructed. moisten with saline. Secondary Dressing Woven Gauze Sponge, Non-Sterile 4x4 in Discharge Instruction: Apply over primary dressing as directed. Secured With [employer], 4.5x3.1 (in/yd) Discharge Instruction: Secure with Kerlix as directed. Paper Tape, 2x10 (in/yd) Discharge Instruction: Secure dressing with tape as directed. Compression Wrap Compression Stockings Add-Ons Notes instructed friend on use of hydrofera blue. verbalizes understanding Electronic Signature(s) Signed: 11/06/2020 12:52:04 PM By: Linton Ham MD Signed: 11/06/2020 5:54:37 PM By: Deon Pilling Entered By: Linton Ham on 11/06/2020 12:23:46 -------------------------------------------------------------------------------- Multi-Disciplinary Care Plan Details Patient Name: Date of Service: [name], Earley Favor [facility] 11/06/2020 10:15 A M Medical Record Number: [id number] Patient Account Number: [id number] Date of Birth/Sex: Treating RN: 1976-01-11 (44 y.o. [name], [name] Primary Care Hadassa Cermak: [name] Other Clinician: Referring Johne Buckle: Treating Sierra Bissonette/Extender: Mammie Russian, Ivan Anchors in Treatment: 6 Active Inactive Abuse / Safety / Falls / Self Care Management Nursing Diagnoses: History of Falls Potential for injury related to falls Goals: Patient will not experience any injury related to falls Date  Initiated: 09/22/2020 Target Resolution Date: 01/01/2021 Goal Status: Active Patient/caregiver will verbalize/demonstrate measures taken to prevent injury and/or falls Date Initiated: 09/22/2020 Target Resolution Date: 12/24/2020 Goal Status: Active Interventions: Assess Activities of Daily Living upon admission and as needed Assess fall risk on admission and as needed Assess: immobility, friction, shearing, incontinence upon admission and as needed Assess impairment of mobility on admission and as needed per policy Assess personal safety and home safety (as indicated) on admission and as needed Assess self care needs on admission and as needed Provide education on fall prevention Notes: Nutrition Nursing Diagnoses: Impaired glucose control: actual or potential Potential for alteratiion in Nutrition/Potential for imbalanced nutrition Goals: Patient/caregiver agrees to and verbalizes understanding of need to use nutritional supplements and/or vitamins as prescribed Date Initiated: 09/22/2020 Date Inactivated: 11/06/2020 Target Resolution Date: 11/06/2020 Goal Status: Met Patient/caregiver will maintain therapeutic glucose control Date Initiated: 09/22/2020 Target Resolution Date: 12/05/2020 Goal Status: Active Interventions: Assess HgA1c results as ordered upon admission and as needed Assess patient nutrition upon admission and as needed per policy Provide education on elevated blood sugars and impact on wound healing Provide education on nutrition Treatment Activities: Education provided on Nutrition : 10/14/2020 Notes: Electronic Signature(s) Signed: 11/06/2020 5:54:37 PM By: Deon Pilling Entered By: Deon Pilling on 11/06/2020  12:02:03 -------------------------------------------------------------------------------- Pain Assessment Details Patient Name: Date of Service: [name] [facility] 11/06/2020 10:15 A M Medical Record Number: [id number] Patient Account Number:  [id number] Date of Birth/Sex: Treating RN: August 10, 1976 (44 y.o. Orvan Falconer Primary Care Yadir Zentner: [name] Other Clinician: Referring Luzmaria Devaux: Treating Tyleigh Mahn/Extender: Mammie Russian, Ivan Anchors in Treatment: 6 Active Problems Location of Pain Severity and Description of Pain Patient Has Paino Yes Site Locations With Dressing Change: Yes Duration of the Pain. Constant / Intermittento Intermittent How Long Does it Lasto Hours: Minutes: 15 Rate the pain. Current Pain Level: 7 Worst Pain Level: 9 Least Pain Level: 0 Character of Pain Describe the Pain: Aching Pain Management and Medication Current Pain Management: Medication: Yes Cold Application: No Rest: Yes Massage: No Activity: No T.E.N.S.: No Heat Application: No Leg drop or elevation: No Is the Current Pain Management Adequate: Inadequate How does your wound impact your activities of daily livingo Sleep: Yes Bathing: No Appetite: No Relationship With Others: No Bladder Continence: No Emotions: No Bowel Continence: No Work: No Toileting: No Drive: No Dressing: No Hobbies: No Electronic Signature(s) Signed: 11/06/2020 4:55:20 PM By: Carlene Coria RN Entered By: Carlene Coria on 11/06/2020 10:47:59 -------------------------------------------------------------------------------- Patient/Caregiver Education Details Patient Name: Date of Service: [name], [name] 12/2/2021andnbsp10:15 A M Medical Record Number: [id number] Patient Account Number: [id number] Date of Birth/Gender: Treating RN: 07/18/1976 (44 y.o. [name] Primary Care Physician: [name] Other Clinician: Referring Physician: Treating Physician/Extender: Mammie Russian, Ivan Anchors in Treatment: 6 Education Assessment Education Provided To: Patient Education Topics Provided Elevated Blood Sugar/ Impact on Healing: Handouts: Elevated Blood Sugars: How Do They Affect Wound  Healing Methods: Explain/Verbal Responses: Reinforcements needed Electronic Signature(s) Signed: 11/06/2020 5:54:37 PM By: Deon Pilling Entered By: Deon Pilling on 11/06/2020 12:02:16 -------------------------------------------------------------------------------- Wound Assessment Details Patient Name: Date of Service: [name] [facility] 11/06/2020 10:15 A M Medical Record Number: [id number] Patient Account Number: [id number] Date of Birth/Sex: Treating RN: [birth year] (44 y.o. Orvan Falconer Primary Care Destry Dauber: [name] Other Clinician: Referring Allesandra Huebsch: Treating Maron Stanzione/Extender: Mammie Russian, Ivan Anchors in Treatment: 6 Wound Status Wound Number: 4 Primary Diabetic Wound/Ulcer of the Lower Extremity Etiology: Wound Location: Right Amputation Site - Transmetatarsal Wound Open Wounding Event: Gradually Appeared Status: Date Acquired: 03/06/2020 Comorbid Asthma, Chronic Obstructive Pulmonary Disease (COPD), Sleep Weeks Of Treatment: 6 History: Apnea, Congestive Heart Failure, Hypertension, Peripheral Venous Clustered Wound: No Disease, Type II Diabetes, Osteoarthritis, Osteomyelitis, Neuropathy Wound Measurements Length: (cm) 1.5 Width: (cm) 0.8 Depth: (cm) 0.4 Area: (cm) 0.942 Volume: (cm) 0.377 % Reduction in Area: -9% % Reduction in Volume: -45.6% Epithelialization: Small (1-33%) Tunneling: No Undermining: No Wound Description Classification: Grade 1 Wound Margin: Distinct, outline attached Exudate Amount: Medium Exudate Type: Serosanguineous Exudate Color: red, brown Foul Odor After Cleansing: No Slough/Fibrino Yes Wound Bed Granulation Amount: Small (1-33%) Exposed Structure Granulation Quality: Pink Fascia Exposed: No Necrotic Amount: Large (67-100%) Fat Layer (Subcutaneous Tissue) Exposed: Yes Necrotic Quality: Adherent Slough Tendon Exposed: No Muscle Exposed: No Joint Exposed: No Bone Exposed: No Treatment Notes Wound  #4 (Amputation Site - Transmetatarsal) Wound Laterality: Right Cleanser Normal Saline Discharge Instruction: Cleanse the wound with Normal Saline prior to applying a clean dressing using gauze sponges, not tissue or cotton balls. Peri-Wound Care Topical Primary Dressing Hydrofera Blue Ready Foam, 4x5 in Discharge Instruction: Apply to wound bed as instructed. moisten with saline. Secondary Dressing Woven Gauze Sponge, Non-Sterile 4x4 in Discharge Instruction: Apply over primary dressing as directed. Secured With [employer]  Roll Sterile, 4.5x3.1 (in/yd) Discharge Instruction: Secure with Kerlix as directed. Paper Tape, 2x10 (in/yd) Discharge Instruction: Secure dressing with tape as directed. Compression Wrap Compression Stockings Add-Ons Notes instructed friend on use of hydrofera blue. verbalizes understanding Electronic Signature(s) Signed: 11/06/2020 4:55:20 PM By: Carlene Coria RN Entered By: Carlene Coria on 11/06/2020 10:53:13 -------------------------------------------------------------------------------- Vitals Details Patient Name: Date of Service: [name], [name] 11/06/2020 10:15 A M Medical Record Number: [id number] Patient Account Number: [id number] Date of Birth/Sex: Treating RN: 1976/09/14 (44 y.o. Orvan Falconer Primary Care Cardell Rachel: [name] Other Clinician: Referring Juliocesar Blasius: Treating Hani Patnode/Extender: Mammie Russian, Ivan Anchors in Treatment: 6 Vital Signs Time Taken: 10:45 Temperature (F): 98 Height (in): 62 Pulse (bpm): 98 Weight (lbs): 362 Respiratory Rate (breaths/min): 16 Body Mass Index (BMI): 66.2 Blood Pressure (mmHg): 161/97 Reference Range: 80 - 120 mg / dl Electronic Signature(s) Signed: 11/06/2020 4:55:20 PM By: Carlene Coria RN Entered By: Carlene Coria on 11/06/2020 10:46:34

## 2020-11-06 NOTE — Progress Notes (Signed)
[name] ([id number]) Visit Report for 11/06/2020 Debridement Details Patient Name: Date of Service: [name] [facility] 11/06/2020 10:15 A M Medical Record Number: [id number] Patient Account Number: [id number] Date of Birth/Sex: Treating RN: 26-Jan-1976 (44 y.o. [name], [name] Primary Care Provider: Chrisandra Hall Other Clinician: Referring Provider: Treating Provider/Extender: Mammie Russian, Ivan Anchors in Treatment: 6 Debridement Performed for Assessment: Wound #4 Right Amputation Site - Transmetatarsal Performed By: Physician Ricard Dillon., MD Debridement Type: Debridement Severity of Tissue Pre Debridement: Fat layer exposed Level of Consciousness (Pre-procedure): Awake and Alert Pre-procedure Verification/Time Out Yes - 11:30 Taken: Start Time: 11:31 Pain Control: Lidocaine 4% T opical Solution T Area Debrided (L x W): otal 1.5 (cm) x 0.8 (cm) = 1.2 (cm) Tissue and other material debrided: Viable, Non-Viable, Slough, Subcutaneous, Skin: Dermis , Fibrin/Exudate, Slough Level: Skin/Subcutaneous Tissue Debridement Description: Excisional Instrument: Curette Bleeding: Minimum Hemostasis Achieved: Pressure End Time: 11:37 Procedural Pain: 0 Post Procedural Pain: 0 Response to Treatment: Procedure was tolerated well Level of Consciousness (Post- Awake and Alert procedure): Post Debridement Measurements of Total Wound Length: (cm) 1.5 Width: (cm) 0.8 Depth: (cm) 0.4 Volume: (cm) 0.377 Character of Wound/Ulcer Post Debridement: Improved Severity of Tissue Post Debridement: Fat layer exposed Post Procedure Diagnosis Same as Pre-procedure Electronic Signature(s) Signed: 11/06/2020 12:52:04 PM By: Linton Ham MD Signed: 11/06/2020 5:54:37 PM By: Deon Pilling Entered By: Linton Ham on 11/06/2020 12:23:58 -------------------------------------------------------------------------------- HPI Details Patient Name: Date of Service: [name], [name]  11/06/2020 10:15 A M Medical Record Number: [id number] Patient Account Number: [id number] Date of Birth/Sex: Treating RN: Nov 04, 1976 (44 y.o. [name] Primary Care Provider: Chrisandra Hall Other Clinician: Referring Provider: Treating Provider/Extender: Mammie Russian, Ivan Anchors in Treatment: 6 History of Present Illness HPI Description: 05/30/19 patient presents today for initial evaluation our clinic concerning significant wounds that she has in the right gluteal region as well as in the left abdominal area. She also has a pressure injury to her trans metatarsal amputation site on the right which does not appear to be as looking at this time but nonetheless does need to close. This occurred while she was in the hospital. The patient was admitted to the hospital on 05/17/19 discharged on 05/25/19. This was due to the significant abscess in the right gluteal region as well as in the left panus region. She has a history of diabetes which is definitely not under good control of the most useful view of an agency being 17.5. She also has hypertension, COPD, and is morbidly obese. Unfortunately the wound and the right gluteal region extends right up and adjacent to the anus which is going to make the Wound VAC impossible at this point. Nonetheless I think that we're gonna have to basically pack this with wet to dry packing at this time in order to hopefully achieve improvement. Nonetheless this is a significant wound with her diabetes be not under control along with her size and again the nature of the wound I think this is gonna take a very long time to heal. 07/04/2019 upon evaluation today patient actually appears to be doing better in regard to her wounds. Her foot ulcer actually appears to be completely healed which is great the abdominal ulcer has filled in quite nicely and seems to be doing well although there is a lot of moisture I think that she would benefit from a silver  alginate dressing. Nonetheless in regard to the wound on her right gluteal region this showed signs of  improvement today still this is a fairly significant ulcer unfortunately. 08/01/2019 on evaluation patient presents for follow-up concerning her wounds over the right gluteal region as well as the left abdominal region. Fortunately both areas are showing signs of improvement which is good news. In fact significant improvement. Spent about a month since have seen her last. Overall I am extremely happy though with the findings of how the wounds have progressed at this point. READMISSION 09/22/2020 This is a now 45 year old woman previously in this clinic for a right gluteal abscess for 3 or 4 visits in 2020. She is a poorly controlled type II diabetic with the last hemoglobin A1c I see in epic at 12.7 in July 2021. She tells [place] that she is here for review of a wound on the tip of her right first metatarsal transmetatarsal amputation. She had an angiogram and angioplasty on 12/20/2018 with angioplasty of her anterior tibial and posterior tibial arteries. She has been followed by Dr. Doran Durand most recent treatment since August I think has been Santyl. She was given a 1 week course of Keflex in mid September. The patient's had an x-ray and also an MRI in August 2021 that did not show osteomyelitis but edema ABI in our clinic was 1.19 Past medical history includes hypertension, hyperlipidemia, COPD, bipolar disorder, poorly controlled type 2 diabetes with PAD. Left BKA, right transmetatarsal amputation, heart failure with preserved ejection fraction, she is minimally I am ambulatory walking only from her wheelchair to the bathroom with her walker. She only wears a stocking on her foot. Otherwise she is in wheelchair or in bed all day long. 11/1; patient readmitted to the clinic 2 weeks ago. She has a wound on the tip of the right first metatarsal transmetatarsal amputation site she has been using Santyl. She  has Medicaid and we have to apply for assistance if we are going to continue with Santyl although I am hopeful that we will be able to change from the [facility] next week. 11/9; area on the tip of the right first metatarsal amputation site. She has been using Santyl. She was in the hospital overnight for chest pain and they gave her another tube of Santyl which is nice since she has Medicaid and Medicaid would not pay for that. Her wound is smaller 12/2; tip of the right first metatarsal amputation site. She has been using Santyl. No changes wound dimension however I think the surface of the wound is better Electronic Signature(s) Signed: 11/06/2020 12:52:04 PM By: Linton Ham MD Entered By: Linton Ham on 11/06/2020 12:24:49 -------------------------------------------------------------------------------- Physical Exam Details Patient Name: Date of Service: [name], [name] 11/06/2020 10:15 A M Medical Record Number: [id number] Patient Account Number: [id number] Date of Birth/Sex: Treating RN: [birth year] (44 y.o. [name] Primary Care Provider: Chrisandra Hall Other Clinician: Referring Provider: Treating Provider/Extender: Mammie Russian, Ivan Anchors in Treatment: 6 Constitutional Patient is hypertensive.. Pulse regular and within target range for patient.Marland Kitchen Respirations regular, non-labored and within target range.. Temperature is normal and within the target range for the patient.Marland Kitchen Appears in no distress. Cardiovascular . Pedal pulses palpable on the right. Notes Wound exam; right first MTP transmetatarsal amputation site. Again #3 curette very adherent fibrinous debris and slough. Hemostasis with direct pressure. I am hopeful that I am able to get enough debridement to allow granulation Electronic Signature(s) Signed: 11/06/2020 12:52:04 PM By: Linton Ham MD Entered By: Linton Ham on 11/06/2020  12:28:55 -------------------------------------------------------------------------------- Physician Orders Details Patient Name: Date of Service: BA  Laverle Hobby [facility] 11/06/2020 10:15 A M Medical Record Number: [id number] Patient Account Number: [id number] Date of Birth/Sex: Treating RN: 07/01/1976 (44 y.o. [name], [name] Primary Care Provider: Chrisandra Hall Other Clinician: Referring Provider: Treating Provider/Extender: Mammie Russian, Ivan Anchors in Treatment: 6 Verbal / Phone Orders: No Diagnosis Coding ICD-10 Coding Code Description E11.621 Type 2 diabetes mellitus with foot ulcer L97.512 Non-pressure chronic ulcer of other part of right foot with fat layer exposed Z89.512 Acquired absence of left leg below knee E11.51 Type 2 diabetes mellitus with diabetic peripheral angiopathy without gangrene Follow-up Appointments Return Appointment in 2 weeks. Bathing/ Shower/ Hygiene May shower with protection but do not get wound dressing(s) wet. - use a cast protector when not changing the dressing. May shower and wash wound with soap and water. - with dressing changes only. Edema Control - Lymphedema / SCD / Other Elevate legs to the level of the heart or above for 30 minutes daily and/or when sitting, a frequency of: - 3-4 times throughout the day. Off-Loading Other: - ensure no pressure to foot wound. Wound Treatment Wound #4 - Amputation Site - Transmetatarsal Wound Laterality: Right Cleanser: Normal Saline (DME) (Generic) Every Other Day/30 Days Discharge Instructions: Cleanse the wound with Normal Saline prior to applying a clean dressing using gauze sponges, not tissue or cotton balls. Prim Dressing: Hydrofera Blue Ready Foam, 4x5 in Every Other Day/30 Days ary Discharge Instructions: Apply to wound bed as instructed. moisten with saline. Secondary Dressing: Woven Gauze Sponge, Non-Sterile 4x4 in (DME) (Generic) Every Other Day/30 Days Discharge Instructions:  Apply over primary dressing as directed. Secured With: [employer], 4.5x3.1 (in/yd) (DME) (Generic) Every Other Day/30 Days Discharge Instructions: Secure with Kerlix as directed. Secured With: Paper Tape, 2x10 (in/yd) (DME) (Generic) Every Other Day/30 Days Discharge Instructions: Secure dressing with tape as directed. Electronic Signature(s) Signed: 11/06/2020 12:52:04 PM By: Linton Ham MD Signed: 11/06/2020 5:54:37 PM By: Deon Pilling Entered By: Deon Pilling on 11/06/2020 11:43:54 -------------------------------------------------------------------------------- Problem List Details Patient Name: Date of Service: [name], TA [facility] 11/06/2020 10:15 A M Medical Record Number: [id number] Patient Account Number: [id number] Date of Birth/Sex: Treating RN: 05-12-1976 (44 y.o. [name] Primary Care Provider: Chrisandra Hall Other Clinician: Referring Provider: Treating Provider/Extender: Mammie Russian, Ivan Anchors in Treatment: 6 Active Problems ICD-10 Encounter Code Description Active Date MDM Diagnosis E11.621 Type 2 diabetes mellitus with foot ulcer 09/22/2020 No Yes L97.512 Non-pressure chronic ulcer of other part of right foot with fat layer exposed 09/22/2020 No Yes Z89.512 Acquired absence of left leg below knee 09/22/2020 No Yes E11.51 Type 2 diabetes mellitus with diabetic peripheral angiopathy without gangrene 09/22/2020 No Yes Inactive Problems Resolved Problems Electronic Signature(s) Signed: 11/06/2020 12:52:04 PM By: Linton Ham MD Entered By: Linton Ham on 11/06/2020 12:23:37 -------------------------------------------------------------------------------- Progress Note Details Patient Name: Date of Service: [name], [name] 11/06/2020 10:15 A M Medical Record Number: [id number] Patient Account Number: [id number] Date of Birth/Sex: Treating RN: 1976/03/02 (44 y.o. [name] Primary Care Provider: Chrisandra Hall Other  Clinician: Referring Provider: Treating Provider/Extender: Mammie Russian, Ivan Anchors in Treatment: 6 Subjective History of Present Illness (HPI) 05/30/19 patient presents today for initial evaluation our clinic concerning significant wounds that she has in the right gluteal region as well as in the left abdominal area. She also has a pressure injury to her trans metatarsal amputation site on the right which does not appear to be as looking at this time but nonetheless does need to close.  This occurred while she was in the hospital. The patient was admitted to the hospital on 05/17/19 discharged on 05/25/19. This was due to the significant abscess in the right gluteal region as well as in the left panus region. She has a history of diabetes which is definitely not under good control of the most useful view of an agency being 17.5. She also has hypertension, COPD, and is morbidly obese. Unfortunately the wound and the right gluteal region extends right up and adjacent to the anus which is going to make the Wound VAC impossible at this point. Nonetheless I think that we're gonna have to basically pack this with wet to dry packing at this time in order to hopefully achieve improvement. Nonetheless this is a significant wound with her diabetes be not under control along with her size and again the nature of the wound I think this is gonna take a very long time to heal. 07/04/2019 upon evaluation today patient actually appears to be doing better in regard to her wounds. Her foot ulcer actually appears to be completely healed which is great the abdominal ulcer has filled in quite nicely and seems to be doing well although there is a lot of moisture I think that she would benefit from a silver alginate dressing. Nonetheless in regard to the wound on her right gluteal region this showed signs of improvement today still this is a fairly significant ulcer unfortunately. 08/01/2019 on evaluation  patient presents for follow-up concerning her wounds over the right gluteal region as well as the left abdominal region. Fortunately both areas are showing signs of improvement which is good news. In fact significant improvement. Spent about a month since have seen her last. Overall I am extremely happy though with the findings of how the wounds have progressed at this point. READMISSION 09/22/2020 This is a now 44 year old woman previously in this clinic for a right gluteal abscess for 3 or 4 visits in 2020. She is a poorly controlled type II diabetic with the last hemoglobin A1c I see in epic at 12.7 in July 2021. She tells [place] that she is here for review of a wound on the tip of her right first metatarsal transmetatarsal amputation. She had an angiogram and angioplasty on 12/20/2018 with angioplasty of her anterior tibial and posterior tibial arteries. She has been followed by Dr. Doran Durand most recent treatment since August I think has been Santyl. She was given a 1 week course of Keflex in mid September. The patient's had an x-ray and also an MRI in August 2021 that did not show osteomyelitis but edema ABI in our clinic was 1.19 Past medical history includes hypertension, hyperlipidemia, COPD, bipolar disorder, poorly controlled type 2 diabetes with PAD. Left BKA, right transmetatarsal amputation, heart failure with preserved ejection fraction, she is minimally I am ambulatory walking only from her wheelchair to the bathroom with her walker. She only wears a stocking on her foot. Otherwise she is in wheelchair or in bed all day long. 11/1; patient readmitted to the clinic 2 weeks ago. She has a wound on the tip of the right first metatarsal transmetatarsal amputation site she has been using Santyl. She has Medicaid and we have to apply for assistance if we are going to continue with Santyl although I am hopeful that we will be able to change from the [facility] next week. 11/9; area on the tip of the  right first metatarsal amputation site. She has been using Santyl. She was in the  hospital overnight for chest pain and they gave her another tube of Santyl which is nice since she has Medicaid and Medicaid would not pay for that. Her wound is smaller 12/2; tip of the right first metatarsal amputation site. She has been using Santyl. No changes wound dimension however I think the surface of the wound is better Objective Constitutional Patient is hypertensive.. Pulse regular and within target range for patient.Marland Kitchen Respirations regular, non-labored and within target range.. Temperature is normal and within the target range for the patient.Marland Kitchen Appears in no distress. Vitals Time Taken: 10:45 AM, Height: 62 in, Weight: 362 lbs, BMI: 66.2, Temperature: 98 F, Pulse: 98 bpm, Respiratory Rate: 16 breaths/min, Blood Pressure: 161/97 mmHg. Cardiovascular Pedal pulses palpable on the right. General Notes: Wound exam; right first MTP transmetatarsal amputation site. Again #3 curette very adherent fibrinous debris and slough. Hemostasis with direct pressure. I am hopeful that I am able to get enough debridement to allow granulation Integumentary (Hair, Skin) Wound #4 status is Open. Original cause of wound was Gradually Appeared. 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 The wound measures 1.5cm length x 0.8cm width x 0.4cm depth; 0.942cm^2 area and 0.377cm^3 volume. There is Fat Layer (Subcutaneous Tissue) exposed. There is no tunneling or undermining noted. There is a medium amount of serosanguineous drainage noted. The wound margin is distinct with the outline attached to the wound base. There is small (1-33%) pink granulation within the wound bed. There is a large (67-100%) amount of necrotic tissue within the wound bed including Adherent Slough. 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 There was a Excisional Skin/Subcutaneous Tissue Debridement with a total area of 1.2 sq cm performed by Ricard Dillon., MD. With the following instrument(s): Curette to remove Viable and Non-Viable tissue/material. Material removed includes Subcutaneous Tissue, Slough, Skin: Dermis, and Fibrin/Exudate after achieving pain control using Lidocaine 4% Topical Solution. A time out was conducted at 11:30, prior to the start of the procedure. A Minimum amount of bleeding was controlled with Pressure. The procedure was tolerated well with a pain level of 0 throughout and a pain level of 0 following the procedure. Post Debridement Measurements: 1.5cm length x 0.8cm width x 0.4cm depth; 0.377cm^3 volume. Character of Wound/Ulcer Post Debridement is improved. Severity of Tissue Post Debridement is: Fat layer exposed. Post procedure Diagnosis Wound #4: Same as Pre-Procedure Plan Follow-up Appointments: Return Appointment in 2 weeks. Bathing/ Shower/ Hygiene: May shower with protection but do not get wound dressing(s) wet. - use a cast protector when not changing the dressing. May shower and wash wound with soap and water. - with dressing changes only. Edema Control - Lymphedema / SCD / Other: Elevate legs to the level of the heart or above for 30 minutes daily and/or when sitting, a frequency of: - 3-4 times throughout the day. Off-Loading: Other: - ensure no pressure to foot wound. WOUND #4: - Amputation Site - Transmetatarsal Wound Laterality: Right Cleanser: Normal Saline (DME) (Generic) Every Other Day/30 Days Discharge Instructions: Cleanse  the wound with Normal Saline prior to applying a clean dressing using  gauze sponges, not tissue or cotton balls. Prim Dressing: Hydrofera Blue Ready Foam, 4x5 in Every Other Day/30 Days ary Discharge Instructions: Apply to wound bed as instructed. moisten with saline. Secondary Dressing: Woven Gauze Sponge, Non-Sterile 4x4 in (DME) (Generic) Every Other Day/30 Days Discharge Instructions: Apply over primary dressing as directed. Secured With: [employer], 4.5x3.1 (in/yd) (DME) (Generic) Every Other Day/30 Days Discharge Instructions: Secure with Kerlix as directed. Secured With: Paper T ape, 2x10 (in/yd) (DME) (Generic) Every Other Day/30 Days Discharge Instructions: Secure dressing with tape as directed. #1 I change the primary dressing to [facility]. She has Medicaid this allows [place] to give her a large piece of dressing that she can use at home. 2. Although I had to debride this today I thought the Annitta Needs is done a decent job of enzymatic debridement and will see if we can get some closure using Hydrofera Blue. Silver collagen would be an option but that does not give [place] a sizable enough piece of material. 3. Follow-up in 2 weeks Electronic Signature(s) Signed: 11/06/2020 12:52:04 PM By: Linton Ham MD Entered By: Linton Ham on 11/06/2020 12:30:11 -------------------------------------------------------------------------------- SuperBill Details Patient Name: Date of Service: [name], TA [facility] 11/06/2020 Medical Record Number: [id number] Patient Account Number: [id number] Date of Birth/Sex: Treating RN: [birth year] (44 y.o. [name], [name] Primary Care Provider: Chrisandra Hall Other Clinician: Referring Provider: Treating Provider/Extender: Mammie Russian, Ivan Anchors in Treatment: 6 Diagnosis Coding ICD-10 Codes Code Description E11.621 Type 2 diabetes mellitus with foot ulcer L97.512 Non-pressure chronic ulcer of other part of right foot with fat  layer exposed Z89.512 Acquired absence of left leg below knee E11.51 Type 2 diabetes mellitus with diabetic peripheral angiopathy without gangrene Facility Procedures CPT4 Code: [id number] Description: 81103 - DEB SUBQ TISSUE 20 SQ CM/< ICD-10 Diagnosis Description L97.512 Non-pressure chronic ulcer of other part of right foot with fat layer exposed Modifier: Quantity: 1 Physician Procedures : CPT4 Code Description Modifier [id number] - WC PHYS SUBQ TISS 20 SQ CM ICD-10 Diagnosis Description L97.512 Non-pressure chronic ulcer of other part of right foot with fat layer exposed Quantity: 1 Electronic Signature(s) Signed: 11/06/2020 12:52:04 PM By: Linton Ham MD Signed: 11/06/2020 12:52:04 PM By: Linton Ham MD Entered By: Linton Ham on 11/06/2020 12:30:22

## 2020-11-07 ENCOUNTER — Other Ambulatory Visit: Payer: Self-pay

## 2020-11-07 ENCOUNTER — Emergency Department (HOSPITAL_COMMUNITY): Payer: Medicaid Other

## 2020-11-07 ENCOUNTER — Encounter (HOSPITAL_COMMUNITY): Payer: Self-pay | Admitting: Emergency Medicine

## 2020-11-07 ENCOUNTER — Telehealth: Payer: Self-pay | Admitting: Family Medicine

## 2020-11-07 ENCOUNTER — Inpatient Hospital Stay: Payer: Medicaid Other | Admitting: Primary Care

## 2020-11-07 ENCOUNTER — Emergency Department (HOSPITAL_COMMUNITY)
Admission: EM | Admit: 2020-11-07 | Discharge: 2020-11-07 | Disposition: A | Discharge status: Home / Self Care | Payer: Medicaid Other | Attending: Emergency Medicine | Admitting: Emergency Medicine

## 2020-11-07 DIAGNOSIS — Z79899 Other long term (current) drug therapy: Secondary | ICD-10-CM | POA: Insufficient documentation

## 2020-11-07 DIAGNOSIS — R0600 Dyspnea, unspecified: Secondary | ICD-10-CM | POA: Diagnosis not present

## 2020-11-07 DIAGNOSIS — E1140 Type 2 diabetes mellitus with diabetic neuropathy, unspecified: Secondary | ICD-10-CM | POA: Insufficient documentation

## 2020-11-07 DIAGNOSIS — R0902 Hypoxemia: Secondary | ICD-10-CM | POA: Diagnosis not present

## 2020-11-07 DIAGNOSIS — J45909 Unspecified asthma, uncomplicated: Secondary | ICD-10-CM | POA: Insufficient documentation

## 2020-11-07 DIAGNOSIS — I10 Essential (primary) hypertension: Secondary | ICD-10-CM | POA: Diagnosis not present

## 2020-11-07 DIAGNOSIS — Z794 Long term (current) use of insulin: Secondary | ICD-10-CM | POA: Insufficient documentation

## 2020-11-07 DIAGNOSIS — J90 Pleural effusion, not elsewhere classified: Secondary | ICD-10-CM | POA: Diagnosis not present

## 2020-11-07 DIAGNOSIS — R079 Chest pain, unspecified: Secondary | ICD-10-CM | POA: Diagnosis not present

## 2020-11-07 DIAGNOSIS — E1165 Type 2 diabetes mellitus with hyperglycemia: Secondary | ICD-10-CM | POA: Diagnosis not present

## 2020-11-07 DIAGNOSIS — J9811 Atelectasis: Secondary | ICD-10-CM | POA: Diagnosis not present

## 2020-11-07 DIAGNOSIS — Z87891 Personal history of nicotine dependence: Secondary | ICD-10-CM | POA: Diagnosis not present

## 2020-11-07 DIAGNOSIS — J449 Chronic obstructive pulmonary disease, unspecified: Secondary | ICD-10-CM | POA: Insufficient documentation

## 2020-11-07 DIAGNOSIS — Z7951 Long term (current) use of inhaled steroids: Secondary | ICD-10-CM | POA: Insufficient documentation

## 2020-11-07 DIAGNOSIS — R0789 Other chest pain: Secondary | ICD-10-CM | POA: Diagnosis not present

## 2020-11-07 LAB — BASIC METABOLIC PANEL
Anion gap: 12 (ref 5–15)
BUN: 31 mg/dL — ABNORMAL HIGH (ref 6–20)
CO2: 28 mmol/L (ref 22–32)
Calcium: 8.7 mg/dL — ABNORMAL LOW (ref 8.9–10.3)
Chloride: 95 mmol/L — ABNORMAL LOW (ref 98–111)
Creatinine, Ser: 1.05 mg/dL — ABNORMAL HIGH (ref 0.44–1.00)
GFR, Estimated: >60 mL/min (ref >60)
Glucose, Bld: 302 mg/dL — ABNORMAL HIGH (ref 70–99)
Potassium: 4.2 mmol/L (ref 3.5–5.1)
Sodium: 135 mmol/L (ref 135–145)

## 2020-11-07 LAB — CBC WITH DIFFERENTIAL/PLATELET
Abs Immature Granulocytes: 0.11 10*3/uL — ABNORMAL HIGH (ref 0.00–0.07)
Basophils Absolute: 0.1 10*3/uL (ref 0.0–0.1)
Basophils Relative: 1 %
Eosinophils Absolute: 0.4 10*3/uL (ref 0.0–0.5)
Eosinophils Relative: 3 %
HCT: 37.3 % (ref 36.0–46.0)
Hemoglobin: 11.8 g/dL — ABNORMAL LOW (ref 12.0–15.0)
Immature Granulocytes: 1 %
Lymphocytes Relative: 34 %
Lymphs Abs: 3.6 10*3/uL (ref 0.7–4.0)
MCH: 29.3 pg (ref 26.0–34.0)
MCHC: 31.6 g/dL (ref 30.0–36.0)
MCV: 92.6 fL (ref 80.0–100.0)
Monocytes Absolute: 0.7 10*3/uL (ref 0.1–1.0)
Monocytes Relative: 6 %
Neutro Abs: 5.8 10*3/uL (ref 1.7–7.7)
Neutrophils Relative %: 55 %
Platelets: 335 10*3/uL (ref 150–400)
RBC: 4.03 MIL/uL (ref 3.87–5.11)
RDW: 13.4 % (ref 11.5–15.5)
WBC: 10.6 10*3/uL — ABNORMAL HIGH (ref 4.0–10.5)
nRBC: 0 % (ref 0.0–0.2)

## 2020-11-07 LAB — TROPONIN I (HIGH SENSITIVITY)
Troponin I (High Sensitivity): 15 ng/L (ref <18)
Troponin I (High Sensitivity): 16 ng/L (ref <18)

## 2020-11-07 LAB — D-DIMER, QUANTITATIVE: D-Dimer, Quant: 0.5 ug/mL-FEU (ref 0.00–0.50)

## 2020-11-07 MED ORDER — KETOROLAC TROMETHAMINE 30 MG/ML IJ SOLN
30.0000 mg | Freq: Once | INTRAMUSCULAR | Status: AC
  Administered 2020-11-07: 30 mg via INTRAVENOUS
  Filled 2020-11-07: qty 1

## 2020-11-07 MED ORDER — IOHEXOL 350 MG/ML SOLN
75.0000 mL | Freq: Once | INTRAVENOUS | Status: AC | PRN
  Administered 2020-11-07: 75 mL via INTRAVENOUS

## 2020-11-07 MED ORDER — MORPHINE SULFATE (PF) 4 MG/ML IV SOLN
4.0000 mg | Freq: Once | INTRAVENOUS | Status: AC
  Administered 2020-11-07: 4 mg via INTRAVENOUS
  Filled 2020-11-07: qty 1

## 2020-11-07 MED ORDER — ONDANSETRON HCL 4 MG/2ML IJ SOLN
4.0000 mg | Freq: Once | INTRAMUSCULAR | Status: AC
  Administered 2020-11-07: 4 mg via INTRAVENOUS
  Filled 2020-11-07: qty 2

## 2020-11-07 NOTE — ED Notes (Signed)
Patient requested [facility] to be called , paged.

## 2020-11-07 NOTE — Telephone Encounter (Signed)
**  After Hours/ Emergency Line Call**  Received a page to call [phone]) - [phone].  Patient: [name]  Caller: Self  Confirmed name & DOB of patient with caller  Subjective:  Having trouble breathing and chest pain. Patient reports that she has been to the hospital multiple times and they can never do anything. Patient reports left sided chest pain and it feels like something is stuck in her throat. Chest pain is constant. Started about 20 minutes ago. She was just lying down when it started. Patient reports that it is constant pressure. She does not have any nitro at home. She denies anything that makes it feel better.   Objective:  Observations: speaking in full sentences. No respiratory distress.   Assessment & Plan  [name] is a 44 y.o. female with PMHx s/f HFpEF, diabetes, hypertension, hyperlipidemia, OSA, obesity who calls with the following complaints and concerns:   Chest pain Chest pain was sudden onset and started about 20 minutes ago.  Patient reports this is the same chest pain that she has presented to the ED for multiple times since early November 2021.  She expresses frustration that her work-up has thus far been negative.  Given patient's comorbidities, I believe she is at high risk for coronary artery disease or ACS and I have instructed her to go to the emergency department for further evaluation.  She reports that she does not want to go to the ED because they have not found anything wrong with her in the past.  Unfortunately, there is not much more I can offer over the phone. Patient reports that she will go to the emergency department for evaluation of chest pain.  -- Will forward to PCP.  Wilber Oliphant, M.D.  11/07/2020 1:02 AM

## 2020-11-07 NOTE — Discharge Instructions (Addendum)
Take acetaminophen and/or ibuprofen as needed for pain relief.  Your evaluation showed no sign of any heart problems, no sign of pneumonia, no sign of blood clots in your lungs.  The exact cause for your pain is unclear, but it does not appear to be any serious medical issue.  Please follow-up with your primary care provider and with your cardiologist.  Return if symptoms are worsening.

## 2020-11-07 NOTE — Progress Notes (Deleted)
@Patient  ID: [name], female    DOB: [birth year], 44 y.o.   MRN: [id number]  No chief complaint on file.   Referring provider: [name], MD  HPI: 44 year old female, former smoker. PMH significant for asthma, OSA/OHS, HFpEF, acute respiratory failure with hypoxia, GERD, hypertension, type 2 diabetes, hx left BKA, hyperlipidemia, bipolar with depression, chronic pain, obesity. Patient Dr. Shearon Stalls, last seen on 08/28/20. Recommended resuming diuretics and re-establishing with cardiology.   Previous [facility] pulmonary encounters: 08/28/20- Dr. Shearon Stalls, consult  Shortness of breath which is worse when she is up walking around, and also when laying flat. She sleeps with one pillow.  Dyspnea worsened in February/March of 2021 and has been progressive.  Has had 3-4 ED visits for shortness of breath in the last year.   She has been on oxygen nocturnally in the past. She has a BIPAP machine now but hasn't been able to wear her nocturnal oxygen. Previously on CPAP. DME is [employer]. Hasn't been able to bleed in her oxygen into her BIPAP machine   Stopped taking lasix 1-2 weeks ago because she didn't want to be a burden on her daughter to ask for help with urinating.   Taking inhalers albuterol nebulizer and symbicort. These do seem to help somewhat.   No childhood respiratory disease.   11/07/2020- Interim  Patient presents today for multiple ED follow-ups. She has been having shortness of breath and nonspecific chest pain. She was last seen in ED early this morning for chest pain. Reports waking up with sudden onset central chest pain. CXR today mild bibasilar atelectasis. CTA was negative for PE, she had mild coronary artery calcification and mild left ventricular hypertrophy. Echocardiogram in July 2021 showed EF 55-60%, right ventricle poorly visualized. She follows with Dr. Burt Knack at [facility]. She is currently on Torsemide 40mg  daily. She is on PAP with 2L oxygen at night. Maintained on  ICS/LABA, [facility] Mannan is formulary on plan. She takes pantoprazole 40mg  daily.    Needs PFTs- inhalers don't work  ? CPAP vs BIPAP, who manages?  Allergies  Allergen Reactions  . Cefepime Other (See Comments)    AKI, see records from [place] hospitalization in January 2020.    Marland Kitchen Kiwi Extract Shortness Of Breath and Swelling  . Morphine And Related Nausea And Vomiting  . Trental [Pentoxifylline] Nausea And Vomiting  . Nubain [Nalbuphine Hcl] Other (See Comments)    "FEELS LIKE SOMETHING CRAWLING ON ME"    Immunization History  Administered Date(s) Administered  . Influenza-Unspecified 07/22/2017  . PFIZER SARS-COV-2 Vaccination 06/26/2020, 07/17/2020  . Pneumococcal Polysaccharide-23 09/21/2016  . Tdap 05/04/2014, 06/29/2015    Past Medical History:  Diagnosis Date  . Acute osteomyelitis of ankle or foot, left ([place]) 01/31/2018  . Alveolar hypoventilation   . Anemia    not on iron pill  . Asthma   . Bipolar 2 disorder ([place])   . Carpal tunnel syndrome on right    recurrent  . Cellulitis 08/2010-08/2011  . Chronic pain   . COPD (chronic obstructive pulmonary disease) ([name])    Symbicort daily and Proventil as needed  . Costochondritis   . Diabetes mellitus type II, uncontrolled ([place]) 2000   Type 2, Uncontrolled.Takes Lantus daily.Fasting blood sugar runs 150  . Drug-seeking behavior   . GERD (gastroesophageal reflux disease)    takes Pantoprazole and Zantac daily  . HLD (hyperlipidemia)    takes Atorvastatin daily  . Hypertension    takes Lisinopril and Coreg daily  . Morbid obesity ([place])   .  Nocturia   . OSA on CPAP   . Peripheral neuropathy    takes Gabapentin daily  . Pneumonia    "walking" several yrs ago and as a baby (12/05/2018)  . Rectal fissure   . Restless leg   . SVT (supraventricular tachycardia) ([place])   . Syncope 02/25/2016  . Urinary frequency   . Urinary incontinence 10/23/2020  . Varicose veins    Right medial thigh and Left leg     Tobacco  History: Social History   Tobacco Use  Smoking Status Former Smoker  . Packs/day: 0.25  . Years: 15.00  . Pack years: 3.75  . Types: Cigarettes  . Quit date: 12/06/2005  . Years since quitting: 14.9  Smokeless Tobacco Never Used  Tobacco Comment   smokes for a couple of months   Counseling given: Not Answered Comment: smokes for a couple of months   Outpatient Medications Prior to Visit  Medication Sig Dispense Refill  . Accu-Chek Softclix Lancets lancets Use to check blood glucose four times daily 100 each 12  . atorvastatin (LIPITOR) 40 MG tablet Take 1 tablet (40 mg total) by mouth daily. 90 tablet 3  . budesonide-formoterol (SYMBICORT) 160-4.5 MCG/ACT inhaler Inhale 2 puffs into the lungs 2 (two) times daily. 10.2 g 4  . Cholecalciferol 1000 units tablet Take 1 tablet (1,000 Units total) by mouth daily. 90 tablet 0  . Compression Bandages MISC Wear daily for swelling 1 each 0  . Continuous Blood Gluc Sensor (DEXCOM G6 SENSOR) MISC Inject 1 applicator into the skin as directed. Change sensor every 10 days. 3 each 11  . Continuous Blood Gluc Transmit (DEXCOM G6 TRANSMITTER) MISC Inject 1 Device into the skin as directed. Reuse 8 times with sensor changes. 1 each 3  . gabapentin (NEURONTIN) 600 MG tablet TAKE 2 TABLETS BY MOUTH 3 TIMES DAILY (Patient taking differently: Take 600 mg by mouth 3 (three) times daily. ) 180 tablet 4  . glucose blood (ACCU-CHEK GUIDE) test strip USE T0 CHECK BLOOD SUGAR 3 TIMES DAILY 100 each 5  . HYDROcodone-acetaminophen (NORCO) 10-325 MG tablet TAKE 1 TABLET BY MOUTH EVERY EIGHT HOURS AS NEEDED (Patient taking differently: Take 1 tablet by mouth every 8 (eight) hours as needed for moderate pain. ) 90 tablet 0  . insulin aspart (NOVOLOG) 100 UNIT/ML FlexPen Inject 20 Units into the skin 3 (three) times daily with meals. 15 mL 11  . insulin glargine (LANTUS SOLOSTAR) 100 UNIT/ML Solostar Pen Inject 70 Units into the skin 2 (two) times daily. 15 mL 11  .  Insulin Pen Needle (SURE COMFORT PEN NEEDLES) 32G X 4 MM MISC Use to inject insulin as indicated 100 each 12  . ipratropium-albuterol (DUONEB) 0.5-2.5 (3) MG/3ML SOLN Take 3 mLs by nebulization every 4 (four) hours as needed. 360 mL 5  . Lancets (ACCU-CHEK MULTICLIX) lancets 1 each by Other route as needed for other. Use as instructed 100 each 1  . metoprolol succinate (TOPROL-XL) 25 MG 24 hr tablet Take 25 mg by mouth daily.    . Misc. Devices (TRANSFER BENCH) MISC Use daily 1 each 0  . potassium chloride SA (KLOR-CON M20) 20 MEQ tablet Take 1 tablet (20 mEq total) by mouth daily. 30 tablet 6  . rOPINIRole (REQUIP) 1 MG tablet Take 1 tablet (1 mg total) by mouth at bedtime. 90 tablet 0  . torsemide (DEMADEX) 20 MG tablet Take 2 tablets (40 mg total) by mouth daily. 60 tablet 6  . VICTOZA 18 MG/3ML  SOPN inject 0.6 MG SUBCUTANEOUSLY ONCE A DAY FOR 1 WEEK, THEN INCREASE TO 1.2 MG ONCE daily max 1.8 MG (Patient taking differently: Inject 1.2 mg into the skin daily. ) 6 mL 3   No facility-administered medications prior to visit.      Review of Systems  Review of Systems   Physical Exam  LMP  (LMP Unknown)  Physical Exam   Lab Results:  CBC    Component Value Date/Time   WBC 10.6 (H) 11/07/2020 0311   RBC 4.03 11/07/2020 0311   HGB 11.8 (L) 11/07/2020 0311   HGB 10.2 (L) 01/02/2019 1121   HCT 37.3 11/07/2020 0311   HCT 31.3 (L) 01/02/2019 1121   PLT 335 11/07/2020 0311   PLT 421 01/02/2019 1121   MCV 92.6 11/07/2020 0311   MCV 88 01/02/2019 1121   MCH 29.3 11/07/2020 0311   MCHC 31.6 11/07/2020 0311   RDW 13.4 11/07/2020 0311   RDW 13.6 01/02/2019 1121   LYMPHSABS 3.6 11/07/2020 0311   LYMPHSABS 4.5 (H) 09/19/2018 0928   MONOABS 0.7 11/07/2020 0311   EOSABS 0.4 11/07/2020 0311   EOSABS 0.2 09/19/2018 0928   BASOSABS 0.1 11/07/2020 0311   BASOSABS 0.1 09/19/2018 0928    BMET    Component Value Date/Time   NA 135 11/07/2020 0311   NA 133 (L) 09/09/2020 1013   K 4.2  11/07/2020 0311   CL 95 (L) 11/07/2020 0311   CO2 28 11/07/2020 0311   GLUCOSE 302 (H) 11/07/2020 0311   BUN 31 (H) 11/07/2020 0311   BUN 18 09/09/2020 1013   CREATININE 1.05 (H) 11/07/2020 0311   CREATININE 0.51 08/05/2015 1114   CALCIUM 8.7 (L) 11/07/2020 0311   GFRNONAA >60 11/07/2020 0311   GFRAA 87 09/09/2020 1013    BNP    Component Value Date/Time   BNP 49.1 11/01/2020 1720    ProBNP    Component Value Date/Time   PROBNP 7.2 07/26/2012 0015    Imaging: DG Chest 2 View  Result Date: 11/01/2020 CLINICAL DATA:  Left-sided chest pain.  History of CHF and COPD EXAM: CHEST - 2 VIEW COMPARISON:  10/09/2020 FINDINGS: Stable cardiomediastinal contours. Low lung volumes. Mildly prominent interstitial markings, similar to prior studies. No focal airspace consolidation. No pleural effusion or pneumothorax. IMPRESSION: Low lung volumes. No acute cardiopulmonary process. Electronically Signed   By: Davina Poke D.O.   On: 11/01/2020 18:12   CT Angio Chest PE W and/or Wo Contrast  Result Date: 11/07/2020 CLINICAL DATA:  Chest pain, dyspnea, elevated D-dimer EXAM: CT ANGIOGRAPHY CHEST WITH CONTRAST TECHNIQUE: Multidetector CT imaging of the chest was performed using the standard protocol during bolus administration of intravenous contrast. Multiplanar CT image reconstructions and MIPs were obtained to evaluate the vascular anatomy. CONTRAST:  35mL OMNIPAQUE IOHEXOL 350 MG/ML SOLN COMPARISON:  06/14/2020 a FINDINGS: Cardiovascular: Satisfactory opacification of the pulmonary arteries to the segmental level. No evidence of pulmonary embolism. Pulmonary arteries are of normal caliber. Mild coronary artery calcification. Global cardiac size within normal limits. Mild left ventricular hypertrophy. No pericardial effusion. Mild atherosclerotic calcification within the thoracic aorta. No aortic aneurysm. Mediastinum/Nodes: No pathologic thoracic adenopathy. Esophagus unremarkable. Lungs/Pleura:  Lungs are clear. No pleural effusion or pneumothorax. Upper Abdomen: No acute abnormality. Musculoskeletal: No acute bone abnormality Review of the MIP images confirms the above findings. IMPRESSION: No pulmonary embolism. Mild coronary artery calcification. Mild left ventricular hypertrophy. Aortic Atherosclerosis (ICD10-I70.0). Electronically Signed   By: Fidela Salisbury MD   On:  11/07/2020 06:52   DG Chest Port 1 View  Result Date: 11/07/2020 CLINICAL DATA:  Chest pain EXAM: PORTABLE CHEST 1 VIEW COMPARISON:  11/01/2020 FINDINGS: Cardiac shadow is stable. Lungs are well aerated bilaterally. Mild bibasilar atelectatic changes are seen. No focal infiltrate or sizable effusion is noted. No bony abnormality is seen. IMPRESSION: Mild bibasilar atelectasis. Electronically Signed   By: Inez Catalina M.D.   On: 11/07/2020 03:26   DG Chest Portable 1 View  Result Date: 10/09/2020 CLINICAL DATA:  Chest pain, shortness of breath EXAM: PORTABLE CHEST 1 VIEW COMPARISON:  Radiograph 07/18/2020, CT 06/14/2020 FINDINGS: Chronic elevation of the right hemidiaphragm. Low volumes and atelectasis. Some mild airways thickening is present as well with more hazy retrocardiac and medial right basilar opacity which could reflect developing airspace disease or further atelectatic change. No pneumothorax or visible effusion. Stable cardiomediastinal contours including some slight tortuosity of the right brachiocephalic vasculature which contributes to some medial right apical density stable from comparison. No acute osseous or soft tissue abnormality. Telemetry leads overlie the chest. IMPRESSION: 1. Low volumes and atelectasis. Chronic elevation of the right hemidiaphragm. 2. Mild airways thickening is present with more hazy retrocardiac and medial right basilar opacity which could reflect a developing infectious/inflammatory process versus further atelectatic change. Electronically Signed   By: Lovena Le M.D.   On: 10/09/2020  04:13     Assessment & Plan:   No problem-specific Assessment & Plan notes found for this encounter.     Martyn Ehrich, NP 11/07/2020

## 2020-11-07 NOTE — ED Notes (Signed)
Ptar  Called to transport home

## 2020-11-07 NOTE — Progress Notes (Signed)
   11/07/20 0600  Clinical Encounter Type  Visited With Patient  Visit Type Initial  Referral From Nurse  Consult/Referral To Chaplain  Spiritual Encounters  Spiritual Needs Emotional;Literature  The chaplain spoke with the patient about her medical condition and completing an advance directive. The chaplain provided living will and POA education. The patient will talk the paperwork over with her mother and sister and make her decision. The chaplain will follow up as needed.

## 2020-11-07 NOTE — ED Triage Notes (Signed)
Pt BIB [facility] from home, c/o left sided chest pressure that woke her from sleep approx 45 minutes pta. Pt reports increased shortness of breath, wears bipap at night and has O2 during the day as needed, states she has been having to use her home O2 during the day more often. Initially pain 10/10, given 1NTG, pain decreased to 7, also given 324mg  asa by EMS.

## 2020-11-07 NOTE — ED Notes (Signed)
States she is still having pain and it is going up her neck , states the nausea is better.

## 2020-11-07 NOTE — ED Provider Notes (Signed)
[facility] EMERGENCY DEPARTMENT Provider Note   CSN: [id number] Arrival date & time: 11/07/20  0207   History Chief Complaint  Patient presents with  . Chest Pain    [name] is a 44 y.o. female.  The history is provided by the patient.  Chest Pain She has history of hypertension, diabetes, hyperlipidemia, morbid obesity peripheral artery disease, COPD and comes in because of chest pain.  She was awakened at about 1 AM complaining of a heavy feeling in her chest which he rated at 9/10.  There is associated dyspnea but no nausea or diaphoresis.  She came in by ambulance and ambulance personnel gave her aspirin and nitroglycerin.  Pain briefly dropped to 7/10, but returned to 9/10.  Nothing seems to make the pain better, nothing makes it worse.  Past Medical History:  Diagnosis Date  . Acute osteomyelitis of ankle or foot, left ([place]) 01/31/2018  . Alveolar hypoventilation   . Anemia    not on iron pill  . Asthma   . Bipolar 2 disorder ([place])   . Carpal tunnel syndrome on right    recurrent  . Cellulitis 08/2010-08/2011  . Chronic pain   . COPD (chronic obstructive pulmonary disease) ([name])    Symbicort daily and Proventil as needed  . Costochondritis   . Diabetes mellitus type II, uncontrolled ([place]) 2000   Type 2, Uncontrolled.Takes Lantus daily.Fasting blood sugar runs 150  . Drug-seeking behavior   . GERD (gastroesophageal reflux disease)    takes Pantoprazole and Zantac daily  . HLD (hyperlipidemia)    takes Atorvastatin daily  . Hypertension    takes Lisinopril and Coreg daily  . Morbid obesity ([place])   . Nocturia   . OSA on CPAP   . Peripheral neuropathy    takes Gabapentin daily  . Pneumonia    "walking" several yrs ago and as a baby (12/05/2018)  . Rectal fissure   . Restless leg   . SVT (supraventricular tachycardia) ([place])   . Syncope 02/25/2016  . Urinary frequency   . Urinary incontinence 10/23/2020  . Varicose veins    Right medial  thigh and Left leg     Patient Active Problem List   Diagnosis Date Noted  . Urinary incontinence 10/23/2020  . Elevated troponin   . Acute respiratory failure with hypoxia and hypercarbia ([place]) 06/15/2020  . Acute respiratory failure with hypoxia ([place]) 06/14/2020  . Blister of foot 04/21/2020  . Exposure to mold 04/21/2020  . Constipation 04/03/2020  . Left shoulder pain 06/23/2019  . Hyperglycemia due to diabetes mellitus ([place])   . 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[place]) 04/11/2019  . Allergic rhinitis 03/06/2018  . Class 3 severe obesity due to excess calories with serious comorbidity and body mass index (BMI) of 50.0 to 59.9 in adult [facility])   . Decreased pedal pulses 06/10/2017  . Unilateral primary osteoarthritis, right knee 10/22/2016  . Primary osteoarthritis of first carpometacarpal joint of left hand 07/30/2016  . Diabetic neuropathy ([place]) 07/14/2016  . Nausea 03/17/2016  . De Quervain's tenosynovitis, bilateral 11/01/2015  . Vitamin D deficiency 09/05/2015  . Recurrent candidiasis of vagina 09/05/2015  . Varicose veins of leg with complications 40/98/1191  . Restless leg syndrome 10/17/2014  . Chronic sinusitis 07/18/2014  . Encounter for chronic pain management 06/30/2013  . HLD (hyperlipidemia) 11/19/2012  . Nonspecific chest pain 06/27/2012  . Abscess of skin and subcutaneous tissue 11/04/2011  . Right carpal tunnel syndrome 09/01/2011  . Bilateral knee pain 09/01/2011  .  DM (diabetes mellitus) type II uncontrolled, periph vascular disorder ([place]) 05/22/2008  . Morbid obesity ([place]) 05/22/2008  . Obesity hypoventilation syndrome ([place]) 05/22/2008  . Depression 05/22/2008  . Obstructive sleep apnea 05/22/2008  . Hypertension 05/22/2008  . Asthma 05/22/2008  . GERD 05/22/2008    Past Surgical History:  Procedure Laterality Date  . AMPUTATION Left 02/01/2018   Procedure: LEFT FOURTH AND 5TH TOE RAY AMPUTATION;  Surgeon: [name], MD;  Location: [facility];  Service:  Orthopedics;  Laterality: Left;  . AMPUTATION Left 03/03/2018   Procedure: LEFT BELOW KNEE AMPUTATION;  Surgeon: [name], MD;  Location: [facility];  Service: Orthopedics;  Laterality: Left;  . CARPAL TUNNEL RELEASE Bilateral   . CESAREAN SECTION  2007  . INCISION AND DRAINAGE PERIRECTAL ABSCESS Left 05/18/2019   Procedure: IRRIGATION AND DEBRIDEMENT OF PANNIS ABSCESS, POSSIBLE DEBRIDEMENT OF BUTTOCK WOUND;  Surgeon: Donnie Mesa, MD;  Location: [facility];  Service: General;  Laterality: Left;  . IRRIGATION AND DEBRIDEMENT BUTTOCKS Left 05/17/2019   Procedure: IRRIGATION AND DEBRIDEMENT BUTTOCKS;  Surgeon: Donnie Mesa, MD;  Location: [facility];  Service: General;  Laterality: Left;  . KNEE ARTHROSCOPY Right 07/17/2010  . LEFT HEART CATHETERIZATION WITH CORONARY ANGIOGRAM N/A 07/27/2012   Procedure: LEFT HEART CATHETERIZATION WITH CORONARY ANGIOGRAM;  Surgeon: Sherren Mocha, MD;  Location[facility] C[facility] LAB;  Service: Cardiovascular;  Laterality: N/A;  . MASS EXCISION N/A 06/29/2013   Procedure:  WIDE LOCAL EXCISION OF POSTERIOR NECK ABSCESS;  Surgeon: Ralene Ok, MD;  Location: [facility];  Service: General;  Laterality: N/A;  . REPAIR KNEE LIGAMENT Left    "fixed ligaments and chipped patella"  . right transmetatarsal amputation        OB History    Gravida  2   Para  1   Term      Preterm      AB  1   Living  1     SAB  1   TAB      Ectopic      Multiple      Live Births              Family History  Problem Relation Age of Onset  . Diabetes Mother   . Hyperlipidemia Mother   . Depression Mother   . GER disease Mother   . Allergic rhinitis Mother   . Restless legs syndrome Mother   . Heart attack Paternal Uncle   . Heart disease Paternal Grandmother   . Heart attack Paternal Grandmother   . Heart attack Paternal Grandfather   . Heart disease Paternal Grandfather   . Heart attack Father   . Migraines Sister   . Cancer Maternal Grandmother        COLON  .  Hypertension Maternal Grandmother   . Hyperlipidemia Maternal Grandmother   . Diabetes Maternal Grandmother   . Other Maternal Grandfather        GUN SHOT  . Anxiety disorder Sister   . Asthma Child     Social History   Tobacco Use  . Smoking status: Former Smoker    Packs/day: 0.25    Years: 15.00    Pack years: 3.75    Types: Cigarettes    Quit date: 12/06/2005    Years since quitting: 14.9  . Smokeless tobacco: Never Used  . Tobacco comment: smokes for a couple of months  Vaping Use  . Vaping Use: Never used  Substance Use Topics  . Alcohol use:  No    Alcohol/week: 0.0 standard drinks  . Drug use: Not Currently    Comment: OD attempts on home meds      Home Medications Prior to Admission medications   Medication Sig Start Date End Date Taking? Authorizing Provider  Accu-Chek Softclix Lancets lancets Use to check blood glucose four times daily Patient not taking: Reported on 10/16/2020 04/17/20   Martyn Malay, MD  atorvastatin (LIPITOR) 40 MG tablet Take 1 tablet (40 mg total) by mouth daily. 04/11/20   [name], MD  budesonide-formoterol [facility]) 160-4.5 MCG/ACT inhaler Inhale 2 puffs into the lungs 2 (two) times daily. 05/09/20   [name], MD  Cholecalciferol 1000 units tablet Take 1 tablet (1,000 Units total) by mouth daily. 04/05/17   [name], MD  Compression Bandages MISC Wear daily for swelling 07/10/20   [name], MD  Continuous Blood Gluc Sensor (DEXCOM G6 SENSOR) MISC Inject 1 applicator into the skin as directed. Change sensor every 10 days. 10/16/20   Leavy Cella, RPH-CPP  Continuous Blood Gluc Transmit (DEXCOM G6 TRANSMITTER) MISC Inject 1 Device into the skin as directed. Reuse 8 times with sensor changes. 10/16/20   Leavy Cella, RPH-CPP  cyclobenzaprine (FLEXERIL) 5 MG tablet Take 5 mg by mouth 3 (three) times daily as needed. Patient not taking: Reported on 10/16/2020 09/03/20   [provider]    gabapentin (NEURONTIN) 600 MG tablet TAKE 2 TABLETS BY MOUTH 3 TIMES DAILY 10/20/20   [name], MD  glucose blood (ACCU-CHEK GUIDE) test strip USE T0 CHECK BLOOD SUGAR 3 TIMES DAILY Patient not taking: Reported on 10/16/2020 04/18/20   Leavy Cella, RPH-CPP  HYDROcodone-acetaminophen (NORCO) 10-325 MG tablet TAKE 1 TABLET BY MOUTH EVERY EIGHT HOURS AS NEEDED 10/20/20   [name], MD  insulin aspart (NOVOLOG) 100 UNIT/ML FlexPen Inject 20 Units into the skin 3 (three) times daily with meals. 10/16/20   Leavy Cella, RPH-CPP  insulin glargine (LANTUS SOLOSTAR) 100 UNIT/ML Solostar Pen Inject 70 Units into the skin 2 (two) times daily. 10/16/20   Leavy Cella, RPH-CPP  Insulin Pen Needle (SURE COMFORT PEN NEEDLES) 32G X 4 MM MISC Use to inject insulin as indicated 04/17/20   Martyn Malay, MD  ipratropium-albuterol (DUONEB) 0.5-2.5 (3) MG/3ML SOLN Take 3 mLs by nebulization every 4 (four) hours as needed. 06/25/20   [name], MD  Lancets (ACCU-CHEK MULTICLIX) lancets 1 each by Other route as needed for other. Use as instructed Patient not taking: Reported on 10/16/2020 04/18/20   Leavy Cella, RPH-CPP  Misc. Devices (TRANSFER BENCH) MISC Use daily 10/29/20   [name], MD  potassium chloride SA (KLOR-CON M20) 20 MEQ tablet Take 1 tablet (20 mEq total) by mouth daily. 09/09/20   Richardson Dopp T, PA-C  rOPINIRole (REQUIP) 1 MG tablet Take 1 tablet (1 mg total) by mouth at bedtime. 08/12/20   [name], MD  torsemide (DEMADEX) 20 MG tablet Take 2 tablets (40 mg total) by mouth daily. 09/09/20   Weaver, Scott T, PA-C  VICTOZA 18 MG/3ML SOPN inject 0.6 MG SUBCUTANEOUSLY ONCE A DAY FOR 1 WEEK, THEN INCREASE TO 1.2 MG ONCE daily max 1.8 MG Patient taking differently: Inject 1.2 mg into the skin daily.  04/16/20   [name], MD    Allergies    Cefepime, Kiwi extract, Morphine and related, Trental [pentoxifylline], and Nubain [nalbuphine  hcl]  Review of Systems  Review of Systems  Cardiovascular: Positive for chest pain.  All other systems reviewed and are negative.   Physical Exam Updated Vital Signs BP 107/75   Pulse 87   Resp 16   LMP  (LMP Unknown)   SpO2 98%   Physical Exam Vitals and nursing note reviewed.   Morbidly obese 44 year old female, appears uncomfortable and is rocking back and forth in the bed, but is in no acute distress. Vital signs are normal. Oxygen saturation is 98%, which is normal. Head is normocephalic and atraumatic. PERRLA, EOMI. Oropharynx is clear. Neck is nontender and supple without adenopathy or JVD. Back is nontender and there is no CVA tenderness. Lungs are clear without rales, wheezes, or rhonchi. Chest is nontender. Heart has regular rate and rhythm without murmur. Abdomen is soft, flat, nontender without masses or hepatosplenomegaly and peristalsis is normoactive. Extremities have no cyanosis or edema, full range of motion is present.  Status post left below the knee amputation. Skin is warm and dry without rash. Neurologic: Mental status is normal, cranial nerves are intact, there are no motor or sensory deficits.  ED Results / Procedures / Treatments   Labs (all labs ordered are listed, but only abnormal results are displayed) Labs Reviewed  CBC WITH DIFFERENTIAL/PLATELET - Abnormal; Notable for the following components:      Result Value   WBC 10.6 (*)    Hemoglobin 11.8 (*)    Abs Immature Granulocytes 0.11 (*)    All other components within normal limits  BASIC METABOLIC PANEL - Abnormal; Notable for the following components:   Chloride 95 (*)    Glucose, Bld 302 (*)    BUN 31 (*)    Creatinine, Ser 1.05 (*)    Calcium 8.7 (*)    All other components within normal limits  D-DIMER, QUANTITATIVE (NOT AT [facility])  TROPONIN I (HIGH SENSITIVITY)  TROPONIN I (HIGH SENSITIVITY)    EKG EKG Interpretation  Date/Time:  Friday November 07 2020 02:15:13  EST Ventricular Rate:  88 PR Interval:    QRS Duration: 98 QT Interval:  383 QTC Calculation: 464 R Axis:   85 Text Interpretation: Sinus rhythm Low voltage, precordial leads When compared with ECG of 11/01/2020, No significant change was found Confirmed by Delora Fuel ([id number]) on 11/07/2020 2:20:24 AM   Radiology CT Angio Chest PE W and/or Wo Contrast  Result Date: 11/07/2020 CLINICAL DATA:  Chest pain, dyspnea, elevated D-dimer EXAM: CT ANGIOGRAPHY CHEST WITH CONTRAST TECHNIQUE: Multidetector CT imaging of the chest was performed using the standard protocol during bolus administration of intravenous contrast. Multiplanar CT image reconstructions and MIPs were obtained to evaluate the vascular anatomy. CONTRAST:  56mL OMNIPAQUE IOHEXOL 350 MG/ML SOLN COMPARISON:  06/14/2020 a FINDINGS: Cardiovascular: Satisfactory opacification of the pulmonary arteries to the segmental level. No evidence of pulmonary embolism. Pulmonary arteries are of normal caliber. Mild coronary artery calcification. Global cardiac size within normal limits. Mild left ventricular hypertrophy. No pericardial effusion. Mild atherosclerotic calcification within the thoracic aorta. No aortic aneurysm. Mediastinum/Nodes: No pathologic thoracic adenopathy. Esophagus unremarkable. Lungs/Pleura: Lungs are clear. No pleural effusion or pneumothorax. Upper Abdomen: No acute abnormality. Musculoskeletal: No acute bone abnormality Review of the MIP images confirms the above findings. IMPRESSION: No pulmonary embolism. Mild coronary artery calcification. Mild left ventricular hypertrophy. Aortic Atherosclerosis (ICD10-I70.0). Electronically Signed   By: Fidela Salisbury MD   On: 11/07/2020 06:52   DG Chest Port 1 View  Result Date: 11/07/2020 CLINICAL DATA:  Chest pain EXAM:  PORTABLE CHEST 1 VIEW COMPARISON:  11/01/2020 FINDINGS: Cardiac shadow is stable. Lungs are well aerated bilaterally. Mild bibasilar atelectatic changes are seen. No focal  infiltrate or sizable effusion is noted. No bony abnormality is seen. IMPRESSION: Mild bibasilar atelectasis. Electronically Signed   By: Inez Catalina M.D.   On: 11/07/2020 03:26    Procedures Procedures   Medications Ordered in ED Medications  ketorolac (TORADOL) 30 MG/ML injection 30 mg (30 mg Intravenous Given 11/07/20 0326)  ondansetron (ZOFRAN) injection 4 mg (4 mg Intravenous Given 11/07/20 0340)  morphine 4 MG/ML injection 4 mg (4 mg Intravenous Given 11/07/20 0505)  ondansetron (ZOFRAN) injection 4 mg (4 mg Intravenous Given 11/07/20 0504)  iohexol (OMNIPAQUE) 350 MG/ML injection 75 mL (75 mLs Intravenous Contrast Given 11/07/20 0645)    ED Course  I have reviewed the triage vital signs and the nursing notes.  Pertinent labs & imaging results that were available during my care of the patient were reviewed by me and considered in my medical decision making (see chart for details).  MDM Rules/Calculators/A&P Chest pain of uncertain cause.  Consider ACS/angina, pulmonary embolism, musculoskeletal pain.  ECG is unchanged from recent ECG.  Old records are reviewed, and she has had 2 ED visits and one hospitalization for similar pain in the last month.  Lexiscan Myoview study in June was low risk, cardiac catheterization in 2013 showed no coronary artery disease.  This is felt to be very unlikely to be cardiac disease, but will check troponin x2.  On other ED visits, ketorolac has been helpful for pain relief so she is given a dose of ketorolac.  Also, she would be at risk for DVT/pulmonary embolism so will screen with D-dimer.  Chest x-ray shows no acute process.  D-dimer is mildly elevated but other labs are unremarkable including normal troponin x2.  She did not get relief of pain with ketorolac and was given morphine for pain.  She started complaining of pain that was radiating in different locations.  Because of elevated D-dimer, she was sent for CT angiogram of the chest which showed no  acute process.  Patient was advised of these findings and recommended that she use over-the-counter analgesics as needed for pain control.  She is referred back to her primary care provider and her cardiologist.  Final Clinical Impression(s) / ED Diagnoses Final diagnoses:  Nonspecific chest pain    Rx / DC Orders ED Discharge Orders    None       Delora Fuel, MD 29/29/09 [phone]

## 2020-11-07 NOTE — ED Notes (Signed)
[facility] here to transport home

## 2020-11-10 ENCOUNTER — Telehealth: Payer: Self-pay | Admitting: Cardiovascular Disease

## 2020-11-10 NOTE — Telephone Encounter (Signed)
Scheduled the patient 12/15 with Dr. Burt Knack. She was grateful for call and agrees with plan.

## 2020-11-10 NOTE — Telephone Encounter (Signed)
[name] is calling requesting to speak with [name] to schedule a hospital f/u. Please advise.

## 2020-11-11 ENCOUNTER — Encounter: Payer: Self-pay | Admitting: Family Medicine

## 2020-11-11 ENCOUNTER — Ambulatory Visit (INDEPENDENT_AMBULATORY_CARE_PROVIDER_SITE_OTHER): Payer: Medicaid Other | Admitting: Family Medicine

## 2020-11-11 ENCOUNTER — Other Ambulatory Visit: Payer: Self-pay

## 2020-11-11 ENCOUNTER — Telehealth: Payer: Self-pay | Admitting: Primary Care

## 2020-11-11 DIAGNOSIS — E785 Hyperlipidemia, unspecified: Secondary | ICD-10-CM

## 2020-11-11 DIAGNOSIS — I10 Essential (primary) hypertension: Secondary | ICD-10-CM | POA: Diagnosis not present

## 2020-11-11 DIAGNOSIS — E1151 Type 2 diabetes mellitus with diabetic peripheral angiopathy without gangrene: Secondary | ICD-10-CM | POA: Diagnosis not present

## 2020-11-11 DIAGNOSIS — F32A Depression, unspecified: Secondary | ICD-10-CM

## 2020-11-11 DIAGNOSIS — F39 Unspecified mood [affective] disorder: Secondary | ICD-10-CM | POA: Diagnosis not present

## 2020-11-11 DIAGNOSIS — E1165 Type 2 diabetes mellitus with hyperglycemia: Secondary | ICD-10-CM

## 2020-11-11 DIAGNOSIS — I5033 Acute on chronic diastolic (congestive) heart failure: Secondary | ICD-10-CM

## 2020-11-11 DIAGNOSIS — E662 Morbid (severe) obesity with alveolar hypoventilation: Secondary | ICD-10-CM | POA: Diagnosis not present

## 2020-11-11 DIAGNOSIS — IMO0002 Reserved for concepts with insufficient information to code with codable children: Secondary | ICD-10-CM

## 2020-11-11 DIAGNOSIS — G8929 Other chronic pain: Secondary | ICD-10-CM

## 2020-11-11 MED ORDER — TERCONAZOLE 0.4 % VA CREA: 1.0000 | TOPICAL_CREAM | Freq: Every day | VAGINAL | 0 refills | Status: DC

## 2020-11-11 MED ORDER — TORSEMIDE 20 MG PO TABS: 60.0000 mg | ORAL_TABLET | Freq: Every day | ORAL | 1 refills | Status: DC

## 2020-11-11 MED ORDER — CAPSAICIN 0.1 % EX CREA: TOPICAL_CREAM | CUTANEOUS | 0 refills | Status: DC

## 2020-11-11 MED ORDER — HYDROCODONE-ACETAMINOPHEN 10-325 MG PO TABS: 1.0000 | ORAL_TABLET | Freq: Three times a day (TID) | ORAL | 0 refills | Status: DC | PRN

## 2020-11-11 MED ORDER — LANTUS SOLOSTAR 100 UNIT/ML ~~LOC~~ SOPN: 80.0000 [IU] | PEN_INJECTOR | Freq: Two times a day (BID) | SUBCUTANEOUS | 11 refills | Status: DC

## 2020-11-11 MED ORDER — INSULIN ASPART 100 UNIT/ML FLEXPEN: 25.0000 [IU] | PEN_INJECTOR | Freq: Three times a day (TID) | SUBCUTANEOUS | 11 refills | Status: DC

## 2020-11-11 MED ORDER — FLUCONAZOLE 150 MG PO TABS: 150.0000 mg | ORAL_TABLET | Freq: Once | ORAL | 0 refills | Status: AC

## 2020-11-11 NOTE — Telephone Encounter (Signed)
Spoke with pt, states she is still experiencing dyspnea and intermittent chest pain.  Pt called last week for this, was advised she needed an OV for further evaluation.  Pt no-showed for 12/3 appt, was brought to ED by ambulance that day for chest pain per Epic.  Pt had negative CTA, was advised to follow up with PCP and cardiology.  Pt saw PCP today, wanted to make sure she had a follow-up with our office.  Dr. Shearon Stalls does not have any openings this week, but scheduled pt to see Beth on Friday at 10:00.  Pt is seeing cardiology next week.  Advised her to call PCP/our office or seek urgent/emergent care if her s/s worsen before this visit.  Pt expressed understanding.  Nothing further needed at this time- will close encounter.

## 2020-11-11 NOTE — Progress Notes (Signed)
Date of Visit: 11/11/2020   SUBJECTIVE:   HPI:  [name] presents today for routine follow up. She has many issues she wants to discuss today.  Diabetes - taking lantus 70u twice daily, novology 20u three times daily with meals, and victoza 1.2mg (minus 5 clicks) daily. Lowest sugar she has gotten recently on her dexcom is 250. Other times it runs very high. Is awaiting endocrinology referral (still in process, patient spoke with referral coordinator Jazmin yesterday). Patient wants to increase insulin dose.  Hyperlipidemia - taking atorvastatin 40mg  daily.  Hypertension - taking torsemide 40mg  daily and metoprolol XL 25mg  daily. Has noted decreased urine output on torsemide lately.  ?yeast infection - has noticed itching and burning in vaginal area lately. Unable to give a urine sample today but does have some dysuria (notes it is more on the outside).  OHS - using bipap nightly. Has noticed need for more oxygen during the daytime lately especially when she exerts herself. Pulse ox will dip into mid-high 80s. Has friend who is now living with her and who is able to do a lot of her care. Has been to ED multiple times in the last few weeks with chest pain and shortness of breath. Has upcoming cardiology appointment next week. Does not have upcoming appointment with pulm (she had an appointment but ended up in ED instead). As above, recent decrease in urine output on torsemide 40mg  daily.  Chronic pain - needs refill on norco. Taking 10-325mg  three times daily. No excessive sedation; storing medication safely. Notes significant increase in neuropathic-type pain in her legs bilaterally. Is up a lot at night crying with pain. Feels stabbing/tingling pain in legs. Is on gabapentin 1200mg  three times daily. Has pain in area of LLE prosthesis.  Obesity - friend who lives with her is working on helping her eat a healthier diet. Needs bariatric bedside commode.  Abscess - has resolved, not a present  concern  OBJECTIVE:   BP 130/80   Pulse 95   Ht 5\' 2"  (1.575 m)   Wt (!) 372 lb 4.8 oz (168.9 kg)   LMP  (LMP Unknown)   SpO2 98%   BMI 68.09 kg/m  Gen: no acute distress ,pleasant, cooperative HEENT: normocephalic, atraumatic  Heart: regular rate and rhythm, no murmur Lungs: clear to auscultation bilaterally though some decreased air movement likely due to habitus, normal work of breathing  Neuro: alert, speech normal, grossly nonfocal Ext: s/p L BKA, s/p R TMA, foot wrapped  Psych: normal range of affect, well groomed, speech normal in rate and volume, normal eye contact   ASSESSMENT/PLAN:   Health maintenance:  -will request eye exam records  Obesity hypoventilation syndrome ([name]) Multifactorial dyspnea and hypoxia. Presently satting mid 42s on room air. Given decreased urine output lately, increase torsemide to 60mg  daily. Follow up in 1 week to recheck BMET. Keep appointment with cardiology, and advised to schedule with pulmonology. No wheezing on exam today, do not think patient needs steroids (would cause more harm than good given markedly elevated sugars).  HLD (hyperlipidemia) Update direct LDL at lab visit in 1 week  Morbid obesity ([place]) Encouraged her to be active and exercise as she is able.  Hypertension Well controlled. Continue current medication regimen.   Encounter for chronic pain management Discussed multimodal approach to pain control given that her present pain is largely neuropathic in nature. -She is on max dose gabapentin. -Advised trialing capsaicin cream. -Patient to contact [facility] to see whether prosthesis is appropriate fit  since she has pain from this specifically -follow up with vascular surgeon at [facility] given prior history of stent placements in legs -refilled norco x1 month (PDMP unable to review at time of visit, reviewed next day 12/8 with appropriate findings) -follow up in 1 month   DM (diabetes mellitus) type II uncontrolled,  periph vascular disorder ([place]) Sugars very poorly controlled, lowest sugar around 250. Increase lantus to 80u twice daily, increase novolog to 25u three times daily Patient very insulin resistant; low suspicion this increase will cause any hypoglycemia but have counseled her to monitor closely for this Will request eye exam records  Depression Patient requested list of counselors at end of visit. PHQ negative for SI. Given resources in after visit summary.   Vulvar itching - patient declined exam today, unable to give urine sample. Will treat empirically with diflucan and terconazole. High likelihood to be yeast given history of the same and marked hyperglycemia. Follow up if not improving.  FOLLOW UP: Follow up in 1 mo for above issues Patient to follow up with cardiology, pulmonology, vascular surgery Await endocrinology referral  [name], [place]

## 2020-11-11 NOTE — Patient Instructions (Signed)
Call vascular surgeon Dr. Tobie Poet Refilled pain medications Will order bedside commode  Increase torsemide to 60mg  daily Lab visit in 1 week to check kidney function and cholesterol number  Increase lantus to 80u twice daily Increase novolog to 25u three times daily with meals  Sent in diflucan and terconazole for yeast  Try capsaicin on legs  See therapy lists below  Follow up in 1 month with me, sooner if needed  Be well, Dr. [name]    Therapy and Counseling Resources Most providers on this list will take Medicaid. Patients with commercial insurance or Medicare should contact their insurance company to get a list of in network providers.  [place] [address] [place], [place] [address]       [phone]     [facility] [address], [place], [place]    Jinny Blossom [facility] [address] [place], [place]  [facility]:  [place]. [place]  [facility]:  [place] [address] Loni Muse, [place]  [facility] (under & uninsured) [address] [place] [phone]    [email or web address] [place]     Phone:  [phone]     [place]  [place] [address]      [place], [place] ext [place]: [place], [place], [place]   [place] ([place] therapist) [address] [place] [address]   [place] [address]    [phone]    [facility]   [address],  [place], [place]   [place]  [place]  [facility]  [address] [place], [place]        [phone]  Open Access/Walk In  Clinic under & uninsured [place],  [address], [place] [phone]):  Mon - Fri from 8 AM - 3 PM  [facility] [place],  ([place])   [place], [place]: [phone]) 8:30 - 12; 1 - 2:30  [facility] the [place],  [place], [place]    [phone]):8:30 - 12; 2 - 3PM  [facility] [employer],  [address]  [place]; [phone]):   Mon - Fri 8 AM - 5 PM  [facility] [place]  MWF 12:30 to 3:00 or call to schedule an appointment  [phone]  Specific Provider options Psychology Today  [email or web address] 1. click on find a therapist  2. enter your zip code 3. left side and select or tailor a therapist for your specific need.   [facility] Provider Directory [email or web address]  (Medicaid)   Follow all drop down to find a provider  [place] or [email or web address] [address], [name], [place] Recovery support and educational   In home counseling [place] Telephone: [phone]  office in [facility] [email or web address]   Does not take reg. Medicaid or Medicare private insurance [facility], [place] health Choice, [facility], [place], [place], [place], [place] Health Choice  24- Hour Availability:  . [place] or [phone]  . Family Service  of the [employer] [phone]  [place]  [phone]   . [place]  [phone] (after hours)  . Therapeutic Alternative/Mobile Crisis   [phone]  . [place] National Suicide Hotline  [phone] ([place])  . Call 911 or go to emergency room  . [employer]  [phone]);  [place] and [employer]   . [employer] ACCESS  [phone]); [place], [place], [place], [place], [place], [place], [place]

## 2020-11-12 ENCOUNTER — Telehealth: Payer: Self-pay

## 2020-11-12 DIAGNOSIS — Z89512 Acquired absence of left leg below knee: Secondary | ICD-10-CM

## 2020-11-12 DIAGNOSIS — R32 Unspecified urinary incontinence: Secondary | ICD-10-CM | POA: Diagnosis not present

## 2020-11-12 NOTE — Assessment & Plan Note (Signed)
Well controlled. Continue current medication regimen.  

## 2020-11-12 NOTE — Telephone Encounter (Signed)
Patient calls nurse line requesting a bariatric hospital bed. Patient stated [employer] told her that's what she needed. Patient apologizes for not telling PCP yesterday at office visit.

## 2020-11-12 NOTE — Assessment & Plan Note (Signed)
Sugars very poorly controlled, lowest sugar around 250. Increase lantus to 80u twice daily, increase novolog to 25u three times daily Patient very insulin resistant; low suspicion this increase will cause any hypoglycemia but have counseled her to monitor closely for this Will request eye exam records

## 2020-11-12 NOTE — Assessment & Plan Note (Signed)
>>  ASSESSMENT AND PLAN FOR INSULIN DEPENDENT TYPE 2 DIABETES MELLITUS ([place]) WRITTEN ON 11/12/2020  6:19 PM BY Bonnita Newby, [name], MD  Sugars very poorly controlled, lowest sugar around 250. Increase lantus to 80u twice daily, increase novolog to 25u three times daily Patient very insulin resistant; low suspicion this increase will cause any hypoglycemia but have counseled her to monitor closely for this Will request eye exam records

## 2020-11-12 NOTE — Telephone Encounter (Signed)
Order entered, routing back to [facility] RN team [name], MD

## 2020-11-12 NOTE — Assessment & Plan Note (Addendum)
Multifactorial dyspnea and hypoxia. Presently satting mid 32s on room air. Given decreased urine output lately, increase torsemide to 60mg  daily. Follow up in 1 week to recheck BMET. Keep appointment with cardiology, and advised to schedule with pulmonology. No wheezing on exam today, do not think patient needs steroids (would cause more harm than good given markedly elevated sugars).

## 2020-11-12 NOTE — Assessment & Plan Note (Signed)
Encouraged her to be active and exercise as she is able.

## 2020-11-12 NOTE — Assessment & Plan Note (Signed)
Update direct LDL at lab visit in 1 week

## 2020-11-12 NOTE — Assessment & Plan Note (Signed)
Discussed multimodal approach to pain control given that her present pain is largely neuropathic in nature. -She is on max dose gabapentin. -Advised trialing capsaicin cream. -Patient to contact [facility] to see whether prosthesis is appropriate fit since she has pain from this specifically -follow up with vascular surgeon at [facility] given prior history of stent placements in legs -refilled norco x1 month (PDMP unable to review at time of visit, reviewed next day 12/8 with appropriate findings) -follow up in 1 month

## 2020-11-12 NOTE — Assessment & Plan Note (Signed)
Patient requested list of counselors at end of visit. PHQ negative for SI. Given resources in after visit summary.

## 2020-11-13 NOTE — Telephone Encounter (Signed)
Community message sent to Darlina Guys, Skeet Latch and Alyse Low @ [facility] to process DME order for hospital bed.   Christen Bame, CMA

## 2020-11-13 NOTE — Telephone Encounter (Signed)
Per [facility]:  order sent for delivery to pickup her semi-electric hospital bed and deliver a bariatric. thanks.

## 2020-11-13 NOTE — Addendum Note (Signed)
Addended by: [name] on: 11/13/2020 04:46 PM   Modules accepted: Orders

## 2020-11-13 NOTE — Telephone Encounter (Signed)
Patient also needs a bariatric bedside commode. I will enter order. Can you guys send that to [facility] as well? Thanks! [name], MD

## 2020-11-14 ENCOUNTER — Other Ambulatory Visit: Payer: Self-pay

## 2020-11-14 ENCOUNTER — Encounter: Payer: Self-pay | Admitting: Primary Care

## 2020-11-14 ENCOUNTER — Ambulatory Visit (INDEPENDENT_AMBULATORY_CARE_PROVIDER_SITE_OTHER): Payer: Medicaid Other | Admitting: Primary Care

## 2020-11-14 VITALS — BP 134/82 | HR 100 | Temp 97.3°F | Ht 62.0 in | Wt 376.2 lb

## 2020-11-14 DIAGNOSIS — R079 Chest pain, unspecified: Secondary | ICD-10-CM | POA: Diagnosis not present

## 2020-11-14 DIAGNOSIS — J45909 Unspecified asthma, uncomplicated: Secondary | ICD-10-CM

## 2020-11-14 DIAGNOSIS — G4733 Obstructive sleep apnea (adult) (pediatric): Secondary | ICD-10-CM

## 2020-11-14 DIAGNOSIS — R0602 Shortness of breath: Secondary | ICD-10-CM

## 2020-11-14 DIAGNOSIS — Z6841 Body Mass Index (BMI) 40.0 and over, adult: Secondary | ICD-10-CM | POA: Diagnosis not present

## 2020-11-14 DIAGNOSIS — J309 Allergic rhinitis, unspecified: Secondary | ICD-10-CM | POA: Diagnosis not present

## 2020-11-14 DIAGNOSIS — J9601 Acute respiratory failure with hypoxia: Secondary | ICD-10-CM | POA: Diagnosis not present

## 2020-11-14 LAB — BASIC METABOLIC PANEL
BUN: 26 mg/dL — ABNORMAL HIGH (ref 6–23)
CO2: 30 mEq/L (ref 19–32)
Calcium: 8.9 mg/dL (ref 8.4–10.5)
Chloride: 97 mEq/L (ref 96–112)
Creatinine, Ser: 1.04 mg/dL (ref 0.40–1.20)
GFR: 66.17 mL/min (ref >60.00)
Glucose, Bld: 357 mg/dL — ABNORMAL HIGH (ref 70–99)
Potassium: 4.5 mEq/L (ref 3.5–5.1)
Sodium: 135 mEq/L (ref 135–145)

## 2020-11-14 LAB — CBC WITH DIFFERENTIAL/PLATELET
Basophils Absolute: 0 10*3/uL (ref 0.0–0.1)
Basophils Relative: 0.4 % (ref 0.0–3.0)
Eosinophils Absolute: 0.2 10*3/uL (ref 0.0–0.7)
Eosinophils Relative: 2 % (ref 0.0–5.0)
HCT: 35.1 % — ABNORMAL LOW (ref 36.0–46.0)
Hemoglobin: 11.5 g/dL — ABNORMAL LOW (ref 12.0–15.0)
Lymphocytes Relative: 28.4 % (ref 12.0–46.0)
Lymphs Abs: 2.7 10*3/uL (ref 0.7–4.0)
MCHC: 32.8 g/dL (ref 30.0–36.0)
MCV: 90.1 fl (ref 78.0–100.0)
Monocytes Absolute: 0.7 10*3/uL (ref 0.1–1.0)
Monocytes Relative: 7.3 % (ref 3.0–12.0)
Neutro Abs: 6 10*3/uL (ref 1.4–7.7)
Neutrophils Relative %: 61.9 % (ref 43.0–77.0)
Platelets: 314 10*3/uL (ref 150.0–400.0)
RBC: 3.89 Mil/uL (ref 3.87–5.11)
RDW: 14.5 % (ref 11.5–15.5)
WBC: 9.6 10*3/uL (ref 4.0–10.5)

## 2020-11-14 LAB — BRAIN NATRIURETIC PEPTIDE: Pro B Natriuretic peptide (BNP): 35 pg/mL (ref 0.0–100.0)

## 2020-11-14 MED ORDER — SPIRIVA RESPIMAT 1.25 MCG/ACT IN AERS: 2.0000 | INHALATION_SPRAY | Freq: Every day | RESPIRATORY_TRACT | 0 refills | Status: DC

## 2020-11-14 MED ORDER — LORATADINE 10 MG PO TABS: 10.0000 mg | ORAL_TABLET | Freq: Every day | ORAL | 3 refills | Status: DC

## 2020-11-14 MED ORDER — OMEPRAZOLE 40 MG PO CPDR: 40.0000 mg | DELAYED_RELEASE_CAPSULE | Freq: Every day | ORAL | 3 refills | Status: DC

## 2020-11-14 NOTE — Telephone Encounter (Signed)
Community message sent to Darlina Guys, Skeet Latch and Alyse Low @ [facility] to process DME order for Bariatric bedside commode.   Christen Bame, CMA

## 2020-11-14 NOTE — Assessment & Plan Note (Signed)
-   Recommend taking daily anithistmine, RX for Claritin 10mg  daily sent into pharmacy  - Checking CBC with diff and IgE

## 2020-11-14 NOTE — Patient Instructions (Addendum)
Recommendations: - Continue Symbicort 160 two puffs twice daily (rinse mouth after use) - Adding inhaler called Spiriva respimat 1.41mcg- take 2 puffs ONCE daily in the morning  - Continue Torsemide 60mg  daily until you see cardiology  - Discuss adding low dose ASA 81mg  with cardiology  - Start Omeprazole 40mg  daily for GERD (RX sent) - Start Claritin 10mg  daily for suspected allergies (Rx sent) - Continue BIPAP with 2L oxygen every night for 4-6 hours or more   Orders: - PFTs re: dyspnea  - Lab work today   Refer: - [facility] bariatric surgery  Re: BMI 68   Follow-up: - Due for follow-up in January with Shearon Stalls / PFTs prior

## 2020-11-14 NOTE — Progress Notes (Signed)
@Patient  ID: [name], female    DOB: 20-Jul-1976, 44 y.o.   MRN: [id number]  Chief Complaint  Patient presents with  . Follow-up    CHF, SOB with Chest Pain    Referring provider: [name], MD  HPI: 44 year old female, former smoker. PMH significant for asthma, OSA/OHS, chronic sinusitis, GERD, HTN, type 2 diabetes, HLD, obesity. Patient of Dr. Shearon Stalls, seen for initial consult on 08/28/20 for shortness of breath/ question of COPD.   Previous [facility] pulmonary encounter: 08/28/20- Dr. Shearon Stalls Shortness of breath which is worse when she is up walking around, and also when laying flat. She sleeps with one pillow.  Dyspnea worsened in February/March of 2021 and has been progressive.  Has had 3-4 ED visits for shortness of breath in the last year.   She has been on oxygen nocturnally in the past. She has a BIPAP machine now but hasn't been able to wear her nocturnal oxygen. Previously on CPAP. DME is [facility]. Hasn't been able to bleed in her oxygen into her BIPAP machine   Stopped taking lasix 1-2 weeks ago because she didn't want to be a burden on her daughter to ask for help with urinating.   Taking inhalers albuterol nebulizer and symbicort. These do seem to help somewhat.   No childhood respiratory disease.    11/14/2020 Patient presents today for symptoms of dyspnea and intermittent chest pain. She was seen by Dr. Shearon Stalls in September 2021 for consult, dyspnea felt to be progressive. Torsemide 60mg  daily was resumed at last visit. Continue BIPAP. She has an apt on 11/07/20 which she no-showed for, she was brought to ED by ambulance that day for chest pain per Epic. She has negative CTA, ECG was unchanged. Lexiscan in June showed low risk. She had hart cath 8 years ago. Advised to follow-up with PCP and cardiology. She saw PCP on 11/11/20 and has an apt with cardiology next week.   She previously smoked at age 81. She is around second hand smoke at home. No PFTs on file. Experiences  dyspnea on exertion and intermittent left sided chest pressure. This has happened on three occasions where she has needed to presents to ED, states that pain medication is the only thing that will relieve pain. She has been taking 60mg  Torsemide daily, she is not urinating much. She has gained weight.   She was started on BIPAP during a hospitalization in July 2021. She had previously been on CPAP for many years. She reports 100% compliance with BIPAP, wears at night and any time she lays down. She wears 2L oxygen during the day as needed as well as at night with BIPAP. Requesting download BIPAP download from [employer]. No active chest pain today.   Airview download 05/18/20-06/16/20 28/30 day (93%); 20 days (67%)  Pressure 4-16 cm h20 AHI 5.3   Allergies  Allergen Reactions  . Cefepime Other (See Comments)    AKI, see records from [place] hospitalization in January 2020.    Marland Kitchen Kiwi Extract Shortness Of Breath and Swelling  . Morphine And Related Nausea And Vomiting  . Trental [Pentoxifylline] Nausea And Vomiting  . Nubain [Nalbuphine Hcl] Other (See Comments)    "FEELS LIKE SOMETHING CRAWLING ON ME"    Immunization History  Administered Date(s) Administered  . Influenza-Unspecified 07/22/2017  . PFIZER SARS-COV-2 Vaccination 06/26/2020, 07/17/2020  . Pneumococcal Polysaccharide-23 09/21/2016  . Tdap 05/04/2014, 06/29/2015    Past Medical History:  Diagnosis Date  . Acute osteomyelitis of ankle  or foot, left ([place]) 01/31/2018  . Alveolar hypoventilation   . Anemia    not on iron pill  . Asthma   . Bipolar 2 disorder ([place])   . Carpal tunnel syndrome on right    recurrent  . Cellulitis 08/2010-08/2011  . Chronic pain   . COPD (chronic obstructive pulmonary disease) ([name])    Symbicort daily and Proventil as needed  . Costochondritis   . Diabetes mellitus type II, uncontrolled ([place]) 2000   Type 2, Uncontrolled.Takes Lantus daily.Fasting blood sugar runs 150  . Drug-seeking behavior   .  GERD (gastroesophageal reflux disease)    takes Pantoprazole and Zantac daily  . HLD (hyperlipidemia)    takes Atorvastatin daily  . Hypertension    takes Lisinopril and Coreg daily  . Morbid obesity ([place])   . Nocturia   . OSA on CPAP   . Peripheral neuropathy    takes Gabapentin daily  . Pneumonia    "walking" several yrs ago and as a baby (12/05/2018)  . Rectal fissure   . Restless leg   . SVT (supraventricular tachycardia) ([place])   . Syncope 02/25/2016  . Urinary frequency   . Urinary incontinence 10/23/2020  . Varicose veins    Right medial thigh and Left leg     Tobacco History: Social History   Tobacco Use  Smoking Status Former Smoker  . Packs/day: 0.25  . Years: 15.00  . Pack years: 3.75  . Types: Cigarettes  . Quit date: 12/06/2005  . Years since quitting: 14.9  Smokeless Tobacco Never Used  Tobacco Comment   smokes for a couple of months   Counseling given: Not Answered Comment: smokes for a couple of months   Outpatient Medications Prior to Visit  Medication Sig Dispense Refill  . Accu-Chek Softclix Lancets lancets Use to check blood glucose four times daily 100 each 12  . atorvastatin (LIPITOR) 40 MG tablet Take 1 tablet (40 mg total) by mouth daily. 90 tablet 3  . budesonide-formoterol (SYMBICORT) 160-4.5 MCG/ACT inhaler Inhale 2 puffs into the lungs 2 (two) times daily. 10.2 g 4  . Capsaicin 0.1 % CREA Apply daily as needed 56.6 g 0  . Cholecalciferol 1000 units tablet Take 1 tablet (1,000 Units total) by mouth daily. 90 tablet 0  . Compression Bandages MISC Wear daily for swelling 1 each 0  . Continuous Blood Gluc Sensor (DEXCOM G6 SENSOR) MISC Inject 1 applicator into the skin as directed. Change sensor every 10 days. 3 each 11  . Continuous Blood Gluc Transmit (DEXCOM G6 TRANSMITTER) MISC Inject 1 Device into the skin as directed. Reuse 8 times with sensor changes. 1 each 3  . fluconazole (DIFLUCAN) 150 MG tablet     . gabapentin (NEURONTIN) 600 MG  tablet TAKE 2 TABLETS BY MOUTH 3 TIMES DAILY (Patient taking differently: Take 1,200 mg by mouth 3 (three) times daily.) 180 tablet 4  . glucose blood (ACCU-CHEK GUIDE) test strip USE T0 CHECK BLOOD SUGAR 3 TIMES DAILY 100 each 5  . HYDROcodone-acetaminophen (NORCO) 10-325 MG tablet Take 1 tablet by mouth every 8 (eight) hours as needed for moderate pain. 90 tablet 0  . insulin aspart (NOVOLOG) 100 UNIT/ML FlexPen Inject 25 Units into the skin 3 (three) times daily with meals. 30 mL 11  . insulin glargine (LANTUS SOLOSTAR) 100 UNIT/ML Solostar Pen Inject 80 Units into the skin 2 (two) times daily. 48 mL 11  . Insulin Pen Needle (SURE COMFORT PEN NEEDLES) 32G X 4 MM MISC  Use to inject insulin as indicated 100 each 12  . ipratropium-albuterol (DUONEB) 0.5-2.5 (3) MG/3ML SOLN Take 3 mLs by nebulization every 4 (four) hours as needed. 360 mL 5  . Lancets (ACCU-CHEK MULTICLIX) lancets 1 each by Other route as needed for other. Use as instructed 100 each 1  . metoprolol succinate (TOPROL-XL) 25 MG 24 hr tablet Take 25 mg by mouth daily.    . Misc. Devices (TRANSFER BENCH) MISC Use daily 1 each 0  . potassium chloride SA (KLOR-CON M20) 20 MEQ tablet Take 1 tablet (20 mEq total) by mouth daily. 30 tablet 6  . rOPINIRole (REQUIP) 1 MG tablet Take 1 tablet (1 mg total) by mouth at bedtime. 90 tablet 0  . terconazole (TERAZOL 7) 0.4 % vaginal cream Place 1 applicator vaginally at bedtime. 45 g 0  . torsemide (DEMADEX) 20 MG tablet Take 3 tablets (60 mg total) by mouth daily. 90 tablet 1  . VICTOZA 18 MG/3ML SOPN inject 0.6 MG SUBCUTANEOUSLY ONCE A DAY FOR 1 WEEK, THEN INCREASE TO 1.2 MG ONCE daily max 1.8 MG (Patient taking differently: Inject 1.2 mg into the skin daily.) 6 mL 3   No facility-administered medications prior to visit.   Review of Systems  Review of Systems  Constitutional: Positive for unexpected weight change.  Respiratory: Positive for shortness of breath.   Cardiovascular: Positive for  leg swelling. Negative for chest pain.   Physical Exam  BP 134/82 (BP Location: Left Arm, Cuff Size: Normal)   Pulse 100   Temp (!) 97.3 F (36.3 C) (Oral)   Ht 5\' 2"  (1.575 m)   Wt (!) 376 lb 3.2 oz (170.6 kg)   LMP  (LMP Unknown)   SpO2 94%   BMI 68.81 kg/m  Physical Exam Constitutional:      Appearance: Normal appearance. She is not ill-appearing.  HENT:     Head: Normocephalic and atraumatic.     Mouth/Throat:     Mouth: Mucous membranes are moist.     Pharynx: Oropharynx is clear.     Tonsils: 3+ on the right. 3+ on the left.     Comments: Mallampati class III Cardiovascular:     Rate and Rhythm: Normal rate and regular rhythm.  Pulmonary:     Effort: Pulmonary effort is normal.     Breath sounds: Normal breath sounds. No wheezing, rhonchi or rales.     Comments: CTA Musculoskeletal:     Cervical back: Normal range of motion and neck supple.     Comments: In WC. Partial right foot amp  Skin:    General: Skin is warm and dry.  Neurological:     General: No focal deficit present.     Mental Status: She is alert and oriented to person, place, and time. Mental status is at baseline.  Psychiatric:        Mood and Affect: Mood normal.        Behavior: Behavior normal.        Thought Content: Thought content normal.        Judgment: Judgment normal.     Lab Results:  CBC    Component Value Date/Time   WBC 9.6 11/14/2020 1051   RBC 3.89 11/14/2020 1051   HGB 11.5 (L) 11/14/2020 1051   HGB 10.2 (L) 01/02/2019 1121   HCT 35.1 (L) 11/14/2020 1051   HCT 31.3 (L) 01/02/2019 1121   PLT 314.0 11/14/2020 1051   PLT 421 01/02/2019 1121   MCV 90.1 11/14/2020  1051   MCV 88 01/02/2019 1121   MCH 29.3 11/07/2020 0311   MCHC 32.8 11/14/2020 1051   RDW 14.5 11/14/2020 1051   RDW 13.6 01/02/2019 1121   LYMPHSABS 2.7 11/14/2020 1051   LYMPHSABS 4.5 (H) 09/19/2018 0928   MONOABS 0.7 11/14/2020 1051   EOSABS 0.2 11/14/2020 1051   EOSABS 0.2 09/19/2018 0928   BASOSABS  0.0 11/14/2020 1051   BASOSABS 0.1 09/19/2018 0928    BMET    Component Value Date/Time   NA 135 11/14/2020 1051   NA 133 (L) 09/09/2020 1013   K 4.5 11/14/2020 1051   CL 97 11/14/2020 1051   CO2 30 11/14/2020 1051   GLUCOSE 357 (H) 11/14/2020 1051   BUN 26 (H) 11/14/2020 1051   BUN 18 09/09/2020 1013   CREATININE 1.04 11/14/2020 1051   CREATININE 0.51 08/05/2015 1114   CALCIUM 8.9 11/14/2020 1051   GFRNONAA >60 11/07/2020 0311   GFRAA 87 09/09/2020 1013    BNP    Component Value Date/Time   BNP 49.1 11/01/2020 1720    ProBNP    Component Value Date/Time   PROBNP 35.0 11/14/2020 1051    Imaging: DG Chest 2 View  Result Date: 11/01/2020 CLINICAL DATA:  Left-sided chest pain.  History of CHF and COPD EXAM: CHEST - 2 VIEW COMPARISON:  10/09/2020 FINDINGS: Stable cardiomediastinal contours. Low lung volumes. Mildly prominent interstitial markings, similar to prior studies. No focal airspace consolidation. No pleural effusion or pneumothorax. IMPRESSION: Low lung volumes. No acute cardiopulmonary process. Electronically Signed   By: Davina Poke D.O.   On: 11/01/2020 18:12   CT Angio Chest PE W and/or Wo Contrast  Result Date: 11/07/2020 CLINICAL DATA:  Chest pain, dyspnea, elevated D-dimer EXAM: CT ANGIOGRAPHY CHEST WITH CONTRAST TECHNIQUE: Multidetector CT imaging of the chest was performed using the standard protocol during bolus administration of intravenous contrast. Multiplanar CT image reconstructions and MIPs were obtained to evaluate the vascular anatomy. CONTRAST:  43mL OMNIPAQUE IOHEXOL 350 MG/ML SOLN COMPARISON:  06/14/2020 a FINDINGS: Cardiovascular: Satisfactory opacification of the pulmonary arteries to the segmental level. No evidence of pulmonary embolism. Pulmonary arteries are of normal caliber. Mild coronary artery calcification. Global cardiac size within normal limits. Mild left ventricular hypertrophy. No pericardial effusion. Mild atherosclerotic  calcification within the thoracic aorta. No aortic aneurysm. Mediastinum/Nodes: No pathologic thoracic adenopathy. Esophagus unremarkable. Lungs/Pleura: Lungs are clear. No pleural effusion or pneumothorax. Upper Abdomen: No acute abnormality. Musculoskeletal: No acute bone abnormality Review of the MIP images confirms the above findings. IMPRESSION: No pulmonary embolism. Mild coronary artery calcification. Mild left ventricular hypertrophy. Aortic Atherosclerosis (ICD10-I70.0). Electronically Signed   By: Fidela Salisbury MD   On: 11/07/2020 06:52   DG Chest Port 1 View  Result Date: 11/07/2020 CLINICAL DATA:  Chest pain EXAM: PORTABLE CHEST 1 VIEW COMPARISON:  11/01/2020 FINDINGS: Cardiac shadow is stable. Lungs are well aerated bilaterally. Mild bibasilar atelectatic changes are seen. No focal infiltrate or sizable effusion is noted. No bony abnormality is seen. IMPRESSION: Mild bibasilar atelectasis. Electronically Signed   By: Inez Catalina M.D.   On: 11/07/2020 03:26     Assessment & Plan:   Obstructive sleep apnea - Patient was changed from CPAP to BIPAP back in July 2021. We only have an Financial trader from her old CPAP machine. She reports 100% compliance with use. We will continue to work with [employer] to see if we can get an update download from her BIPAP machine.   Chronic respiratory failure  with hypoxia ([place]) - Wears 2L O2 prn during the day and at night with CPAP   Asthma - Experiencing dyspnea with intermittent chest pain - Continue Symbicort 160  Two puffs twice daily; adding Spiriva respimat 1.21mcg  - Needs PFTs  Allergic rhinitis - Recommend taking daily anithistmine, RX for Claritin 10mg  daily sent into pharmacy  - Checking CBC with diff and IgE  Nonspecific chest pain - Patient has presented to ED 3-4 times with left sided chest pain, described as pressure. Only think that helps is pain medication. Unclear etiology - Lexiscan low risk, ECG unchanged. Trop mildly  elevated - Has apt with cardiology next week  Morbid obesity ([place]) - Contributing to overall poor health status including asthma, chronic respiratory failure and obesity hypoventilation syndrome  - We will refer to [facility] bariatric surgery  Re: BMI [address]     Martyn Ehrich, NP 11/14/2020

## 2020-11-14 NOTE — Assessment & Plan Note (Signed)
-   Contributing to overall poor health status including asthma, chronic respiratory failure and obesity hypoventilation syndrome  - We will refer to [facility] bariatric surgery  Re: BMI 68

## 2020-11-14 NOTE — Assessment & Plan Note (Signed)
-   Experiencing dyspnea with intermittent chest pain - Continue Symbicort 160  Two puffs twice daily; adding Spiriva respimat 1.28mcg  - Needs PFTs

## 2020-11-14 NOTE — Assessment & Plan Note (Signed)
-   Patient has presented to ED 3-4 times with left sided chest pain, described as pressure. Only think that helps is pain medication. Unclear etiology - Lexiscan low risk, ECG unchanged. Trop mildly elevated - Has apt with cardiology next week

## 2020-11-14 NOTE — Addendum Note (Signed)
Addended byCoralie Keens on: 11/14/2020 05:08 PM   Modules accepted: Orders

## 2020-11-14 NOTE — Progress Notes (Signed)
Please let patient know her BNP (fluid level was norma), Glucose was elevated 357. CBC was stable. No changes to diuretics at this time.

## 2020-11-14 NOTE — Assessment & Plan Note (Signed)
-   Patient was changed from CPAP to BIPAP back in July 2021. We only have an Financial trader from her old CPAP machine. She reports 100% compliance with use. We will continue to work with [employer] to see if we can get an update download from her BIPAP machine.

## 2020-11-14 NOTE — Assessment & Plan Note (Signed)
-   Wears 2L O2 prn during the day and at night with CPAP

## 2020-11-17 ENCOUNTER — Emergency Department (HOSPITAL_COMMUNITY)
Admission: EM | Admit: 2020-11-17 | Discharge: 2020-11-17 | Disposition: A | Discharge status: Home / Self Care | Payer: Medicaid Other | Attending: Emergency Medicine | Admitting: Emergency Medicine

## 2020-11-17 ENCOUNTER — Emergency Department (HOSPITAL_COMMUNITY): Payer: Medicaid Other

## 2020-11-17 ENCOUNTER — Other Ambulatory Visit: Payer: Self-pay

## 2020-11-17 ENCOUNTER — Encounter (HOSPITAL_COMMUNITY): Payer: Self-pay

## 2020-11-17 DIAGNOSIS — E1165 Type 2 diabetes mellitus with hyperglycemia: Secondary | ICD-10-CM | POA: Diagnosis not present

## 2020-11-17 DIAGNOSIS — Z79899 Other long term (current) drug therapy: Secondary | ICD-10-CM | POA: Insufficient documentation

## 2020-11-17 DIAGNOSIS — J449 Chronic obstructive pulmonary disease, unspecified: Secondary | ICD-10-CM | POA: Insufficient documentation

## 2020-11-17 DIAGNOSIS — Z9861 Coronary angioplasty status: Secondary | ICD-10-CM | POA: Diagnosis not present

## 2020-11-17 DIAGNOSIS — R079 Chest pain, unspecified: Secondary | ICD-10-CM | POA: Insufficient documentation

## 2020-11-17 DIAGNOSIS — R0902 Hypoxemia: Secondary | ICD-10-CM | POA: Diagnosis not present

## 2020-11-17 DIAGNOSIS — Z7952 Long term (current) use of systemic steroids: Secondary | ICD-10-CM | POA: Insufficient documentation

## 2020-11-17 DIAGNOSIS — E1140 Type 2 diabetes mellitus with diabetic neuropathy, unspecified: Secondary | ICD-10-CM | POA: Diagnosis not present

## 2020-11-17 DIAGNOSIS — J45909 Unspecified asthma, uncomplicated: Secondary | ICD-10-CM | POA: Diagnosis not present

## 2020-11-17 DIAGNOSIS — Z87891 Personal history of nicotine dependence: Secondary | ICD-10-CM | POA: Insufficient documentation

## 2020-11-17 DIAGNOSIS — E1151 Type 2 diabetes mellitus with diabetic peripheral angiopathy without gangrene: Secondary | ICD-10-CM | POA: Diagnosis not present

## 2020-11-17 DIAGNOSIS — I10 Essential (primary) hypertension: Secondary | ICD-10-CM | POA: Insufficient documentation

## 2020-11-17 DIAGNOSIS — R0789 Other chest pain: Secondary | ICD-10-CM | POA: Diagnosis not present

## 2020-11-17 DIAGNOSIS — J9811 Atelectasis: Secondary | ICD-10-CM | POA: Diagnosis not present

## 2020-11-17 DIAGNOSIS — Z794 Long term (current) use of insulin: Secondary | ICD-10-CM | POA: Diagnosis not present

## 2020-11-17 LAB — CBC WITH DIFFERENTIAL/PLATELET
Abs Immature Granulocytes: 0.07 10*3/uL (ref 0.00–0.07)
Basophils Absolute: 0.1 10*3/uL (ref 0.0–0.1)
Basophils Relative: 1 %
Eosinophils Absolute: 0.2 10*3/uL (ref 0.0–0.5)
Eosinophils Relative: 2 %
HCT: 36.2 % (ref 36.0–46.0)
Hemoglobin: 11.2 g/dL — ABNORMAL LOW (ref 12.0–15.0)
Immature Granulocytes: 1 %
Lymphocytes Relative: 29 %
Lymphs Abs: 2.6 10*3/uL (ref 0.7–4.0)
MCH: 29.1 pg (ref 26.0–34.0)
MCHC: 30.9 g/dL (ref 30.0–36.0)
MCV: 94 fL (ref 80.0–100.0)
Monocytes Absolute: 0.7 10*3/uL (ref 0.1–1.0)
Monocytes Relative: 7 %
Neutro Abs: 5.3 10*3/uL (ref 1.7–7.7)
Neutrophils Relative %: 60 %
Platelets: 306 10*3/uL (ref 150–400)
RBC: 3.85 MIL/uL — ABNORMAL LOW (ref 3.87–5.11)
RDW: 13.8 % (ref 11.5–15.5)
WBC: 8.9 10*3/uL (ref 4.0–10.5)
nRBC: 0 % (ref 0.0–0.2)

## 2020-11-17 LAB — IGE: IgE (Immunoglobulin E), Serum: 71 kU/L (ref <=114)

## 2020-11-17 LAB — COMPREHENSIVE METABOLIC PANEL
ALT: 18 U/L (ref 0–44)
AST: 12 U/L — ABNORMAL LOW (ref 15–41)
Albumin: 2.7 g/dL — ABNORMAL LOW (ref 3.5–5.0)
Alkaline Phosphatase: 104 U/L (ref 38–126)
Anion gap: 11 (ref 5–15)
BUN: 23 mg/dL — ABNORMAL HIGH (ref 6–20)
CO2: 27 mmol/L (ref 22–32)
Calcium: 8.7 mg/dL — ABNORMAL LOW (ref 8.9–10.3)
Chloride: 96 mmol/L — ABNORMAL LOW (ref 98–111)
Creatinine, Ser: 0.88 mg/dL (ref 0.44–1.00)
GFR, Estimated: >60 mL/min (ref >60)
Glucose, Bld: 399 mg/dL — ABNORMAL HIGH (ref 70–99)
Potassium: 4.2 mmol/L (ref 3.5–5.1)
Sodium: 134 mmol/L — ABNORMAL LOW (ref 135–145)
Total Bilirubin: 0.6 mg/dL (ref 0.3–1.2)
Total Protein: 6.8 g/dL (ref 6.5–8.1)

## 2020-11-17 LAB — TROPONIN I (HIGH SENSITIVITY)
Troponin I (High Sensitivity): 21 ng/L — ABNORMAL HIGH (ref <18)
Troponin I (High Sensitivity): 20 ng/L — ABNORMAL HIGH (ref <18)

## 2020-11-17 LAB — BRAIN NATRIURETIC PEPTIDE: B Natriuretic Peptide: 28.2 pg/mL (ref 0.0–100.0)

## 2020-11-17 MED ORDER — MORPHINE SULFATE (PF) 4 MG/ML IV SOLN
4.0000 mg | Freq: Once | INTRAVENOUS | Status: AC
  Administered 2020-11-17: 4 mg via INTRAVENOUS
  Filled 2020-11-17: qty 1

## 2020-11-17 MED ORDER — ONDANSETRON HCL 4 MG/2ML IJ SOLN
4.0000 mg | Freq: Once | INTRAMUSCULAR | Status: AC
  Administered 2020-11-17: 4 mg via INTRAVENOUS
  Filled 2020-11-17: qty 2

## 2020-11-17 MED ORDER — MORPHINE SULFATE (PF) 4 MG/ML IV SOLN
4.0000 mg | Freq: Once | INTRAVENOUS | Status: AC
Start: 2020-11-17 — End: 2020-11-17
  Administered 2020-11-17: 4 mg via INTRAVENOUS
  Filled 2020-11-17: qty 1

## 2020-11-17 MED ORDER — KETOROLAC TROMETHAMINE 30 MG/ML IJ SOLN
30.0000 mg | Freq: Once | INTRAMUSCULAR | Status: AC
  Administered 2020-11-17: 30 mg via INTRAVENOUS
  Filled 2020-11-17: qty 1

## 2020-11-17 NOTE — ED Provider Notes (Signed)
[facility] EMERGENCY DEPARTMENT Provider Note   CSN: [id number] Arrival date & time: 11/17/20  9892     History Chief Complaint  Patient presents with  . Chest Pain    [name] is a 44 y.o. female.  44 y.o female with an extensive PMH including DM, COPD, SVT,HTN, HLD presents to the ED with a chief complaint of chest pain x 3 hours ago. Patient has been seen in the ED previously for chest pain, and has a scheduled appointment with cardiology on 11/19/2020 with Dr. Burt Knack. Patient received 4 ASA along with nitro which helped eased her pain some. She is on baseline O2 2L PRN at home. No sick contacts. No prior hx of CHF.    The history is provided by the patient.  Chest Pain Pain location:  L chest Pain quality: pressure   Pain radiates to:  Does not radiate Pain severity:  Moderate Onset quality:  Gradual Duration:  3 hours Timing:  Constant Progression:  Unchanged Relieved by:  Nothing Worsened by:  Nothing Ineffective treatments:  Oxygen Associated symptoms: no abdominal pain, no back pain, no cough, no dizziness, no fever, no headache, no nausea and no shortness of breath   Risk factors: diabetes mellitus, high cholesterol, hypertension, obesity and smoking   Risk factors: no coronary artery disease and no prior DVT/PE        Past Medical History:  Diagnosis Date  . Acute osteomyelitis of ankle or foot, left ([place]) 01/31/2018  . Alveolar hypoventilation   . Anemia    not on iron pill  . Asthma   . Bipolar 2 disorder ([place])   . Carpal tunnel syndrome on right    recurrent  . Cellulitis 08/2010-08/2011  . Chronic pain   . COPD (chronic obstructive pulmonary disease) ([name])    Symbicort daily and Proventil as needed  . Costochondritis   . Diabetes mellitus type II, uncontrolled ([place]) 2000   Type 2, Uncontrolled.Takes Lantus daily.Fasting blood sugar runs 150  . Drug-seeking behavior   . GERD (gastroesophageal reflux disease)    takes  Pantoprazole and Zantac daily  . HLD (hyperlipidemia)    takes Atorvastatin daily  . Hypertension    takes Lisinopril and Coreg daily  . Morbid obesity ([place])   . Nocturia   . OSA on CPAP   . Peripheral neuropathy    takes Gabapentin daily  . Pneumonia    "walking" several yrs ago and as a baby (12/05/2018)  . Rectal fissure   . Restless leg   . SVT (supraventricular tachycardia) ([place])   . Syncope 02/25/2016  . Urinary frequency   . Urinary incontinence 10/23/2020  . Varicose veins    Right medial thigh and Left leg     Patient Active Problem List   Diagnosis Date Noted  . Urinary incontinence 10/23/2020  . Elevated troponin   . Acute respiratory failure with hypoxia and hypercarbia ([place]) 06/15/2020  . Chronic respiratory failure with hypoxia ([place]) 06/14/2020  . Blister of foot 04/21/2020  . Exposure to mold 04/21/2020  . Constipation 04/03/2020  . Left shoulder pain 06/23/2019  . Hyperglycemia due to diabetes mellitus ([place])   . 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[place]) 04/11/2019  . Allergic rhinitis 03/06/2018  . Class 3 severe obesity due to excess calories with serious comorbidity and body mass index (BMI) of 50.0 to 59.9 in adult [facility])   . Decreased pedal pulses 06/10/2017  . Unilateral primary osteoarthritis, right knee 10/22/2016  . Primary osteoarthritis of  first carpometacarpal joint of left hand 07/30/2016  . Diabetic neuropathy ([place]) 07/14/2016  . Nausea 03/17/2016  . De Quervain's tenosynovitis, bilateral 11/01/2015  . Vitamin D deficiency 09/05/2015  . Recurrent candidiasis of vagina 09/05/2015  . Varicose veins of leg with complications 35/70/1779  . Restless leg syndrome 10/17/2014  . Chronic sinusitis 07/18/2014  . Encounter for chronic pain management 06/30/2013  . HLD (hyperlipidemia) 11/19/2012  . Nonspecific chest pain 06/27/2012  . Abscess of skin and subcutaneous tissue 11/04/2011  . Right carpal tunnel syndrome 09/01/2011  . Bilateral knee pain 09/01/2011   . DM (diabetes mellitus) type II uncontrolled, periph vascular disorder ([place]) 05/22/2008  . Morbid obesity ([place]) 05/22/2008  . Obesity hypoventilation syndrome ([place]) 05/22/2008  . Mood disorder ([place]) 05/22/2008  . Obstructive sleep apnea 05/22/2008  . Hypertension 05/22/2008  . Asthma 05/22/2008  . GERD 05/22/2008    Past Surgical History:  Procedure Laterality Date  . AMPUTATION Left 02/01/2018   Procedure: LEFT FOURTH AND 5TH TOE RAY AMPUTATION;  Surgeon: [name], MD;  Location: [place];  Service: Orthopedics;  Laterality: Left;  . AMPUTATION Left 03/03/2018   Procedure: LEFT BELOW KNEE AMPUTATION;  Surgeon: [name], MD;  Location: [place];  Service: Orthopedics;  Laterality: Left;  . CARPAL TUNNEL RELEASE Bilateral   . CESAREAN SECTION  2007  . INCISION AND DRAINAGE PERIRECTAL ABSCESS Left 05/18/2019   Procedure: IRRIGATION AND DEBRIDEMENT OF PANNIS ABSCESS, POSSIBLE DEBRIDEMENT OF BUTTOCK WOUND;  Surgeon: Donnie Mesa, MD;  Location: [place];  Service: General;  Laterality: Left;  . IRRIGATION AND DEBRIDEMENT BUTTOCKS Left 05/17/2019   Procedure: IRRIGATION AND DEBRIDEMENT BUTTOCKS;  Surgeon: Donnie Mesa, MD;  Location: [place];  Service: General;  Laterality: Left;  . KNEE ARTHROSCOPY Right 07/17/2010  . LEFT HEART CATHETERIZATION WITH CORONARY ANGIOGRAM N/A 07/27/2012   Procedure: LEFT HEART CATHETERIZATION WITH CORONARY ANGIOGRAM;  Surgeon: Sherren Mocha, MD;  Location: [facility] C[facility] LAB;  Service: Cardiovascular;  Laterality: N/A;  . MASS EXCISION N/A 06/29/2013   Procedure:  WIDE LOCAL EXCISION OF POSTERIOR NECK ABSCESS;  Surgeon: Ralene Ok, MD;  Location: [place];  Service: General;  Laterality: N/A;  . REPAIR KNEE LIGAMENT Left    "fixed ligaments and chipped patella"  . right transmetatarsal amputation        OB History    Gravida  2   Para  1   Term      Preterm      AB  1   Living  1     SAB  1   IAB      Ectopic      Multiple      Live Births               Family History  Problem Relation Age of Onset  . Diabetes Mother   . Hyperlipidemia Mother   . Depression Mother   . GER disease Mother   . Allergic rhinitis Mother   . Restless legs syndrome Mother   . Heart attack Paternal Uncle   . Heart disease Paternal Grandmother   . Heart attack Paternal Grandmother   . Heart attack Paternal Grandfather   . Heart disease Paternal Grandfather   . Heart attack Father   . Migraines Sister   . Cancer Maternal Grandmother        COLON  . Hypertension Maternal Grandmother   . Hyperlipidemia Maternal Grandmother   . Diabetes Maternal Grandmother   . Other Maternal Grandfather  GUN SHOT  . Anxiety disorder Sister   . Asthma Child     Social History   Tobacco Use  . Smoking status: Former Smoker    Packs/day: 0.25    Years: 15.00    Pack years: 3.75    Types: Cigarettes    Quit date: 12/06/2005    Years since quitting: 14.9  . Smokeless tobacco: Never Used  . Tobacco comment: smokes for a couple of months  Vaping Use  . Vaping Use: Never used  Substance Use Topics  . Alcohol use: No    Alcohol/week: 0.0 standard drinks  . Drug use: Not Currently    Comment: OD attempts on home meds      Home Medications Prior to Admission medications   Medication Sig Start Date End Date Taking? Authorizing Provider  atorvastatin (LIPITOR) 40 MG tablet Take 1 tablet (40 mg total) by mouth daily. 04/11/20  Yes [name], MD  budesonide-formoterol [facility]) 160-4.5 MCG/ACT inhaler Inhale 2 puffs into the lungs 2 (two) times daily. 05/09/20  Yes [name], MD  Capsaicin 0.1 % CREA Apply daily as needed Patient taking differently: Apply 1 application topically as needed (for pain). 11/11/20  Yes [name], MD  Cholecalciferol 1000 units tablet Take 1 tablet (1,000 Units total) by mouth daily. 04/05/17  Yes [name], MD  fluconazole (DIFLUCAN) 150 MG tablet Take 150 mg by mouth See admin  instructions. TAKE 1 TABLET BY MOUTH ONCE FOR 1 DOSE. REPEAT in 3 DAYS IF NO IMPROVEMENT 11/11/20  Yes [provider]  gabapentin (NEURONTIN) 600 MG tablet TAKE 2 TABLETS BY MOUTH 3 TIMES DAILY Patient taking differently: Take 1,200 mg by mouth 3 (three) times daily. 10/20/20  Yes [name], MD  HYDROcodone-acetaminophen (NORCO) 10-325 MG tablet Take 1 tablet by mouth every 8 (eight) hours as needed for moderate pain. 11/11/20  Yes [name], MD  insulin aspart (NOVOLOG) 100 UNIT/ML FlexPen Inject 25 Units into the skin 3 (three) times daily with meals. 11/11/20  Yes [name], MD  insulin glargine (LANTUS SOLOSTAR) 100 UNIT/ML Solostar Pen Inject 80 Units into the skin 2 (two) times daily. 11/11/20  Yes [name], MD  ipratropium-albuterol (DUONEB) 0.5-2.5 (3) MG/3ML SOLN Take 3 mLs by nebulization every 4 (four) hours as needed. Patient taking differently: Take 3 mLs by nebulization every 4 (four) hours as needed (shortness of breath). 06/25/20  Yes [name], MD  loratadine (CLARITIN) 10 MG tablet Take 1 tablet (10 mg total) by mouth daily. 11/14/20  Yes Martyn Ehrich, NP  metoprolol succinate (TOPROL-XL) 25 MG 24 hr tablet Take 25 mg by mouth daily. 10/23/20  Yes [provider]  omeprazole (PRILOSEC) 40 MG capsule Take 1 capsule (40 mg total) by mouth daily. 11/14/20  Yes Martyn Ehrich, NP  potassium chloride SA (KLOR-CON M20) 20 MEQ tablet Take 1 tablet (20 mEq total) by mouth daily. 09/09/20  Yes Weaver, Scott T, PA-C  rOPINIRole (REQUIP) 1 MG tablet Take 1 tablet (1 mg total) by mouth at bedtime. 08/12/20  Yes [name], MD  terconazole (TERAZOL 7) 0.4 % vaginal cream Place 1 applicator vaginally at bedtime. 11/11/20  Yes [name], MD  Tiotropium Bromide Monohydrate (SPIRIVA RESPIMAT) 1.25 MCG/ACT AERS Inhale 2 puffs into the lungs daily. 11/14/20  Yes Martyn Ehrich, NP  torsemide (DEMADEX)  20 MG tablet Take 3 tablets (60 mg total) by mouth daily. 11/11/20  Yes  [name], MD  VICTOZA 18 MG/3ML SOPN inject 0.6 MG SUBCUTANEOUSLY ONCE A DAY FOR 1 WEEK, THEN INCREASE TO 1.2 MG ONCE daily max 1.8 MG Patient taking differently: Inject 1.2 mg into the skin daily. 04/16/20  Yes [name], MD  Accu-Chek Softclix Lancets lancets Use to check blood glucose four times daily 04/17/20   Martyn Malay, MD  Compression Bandages MISC Wear daily for swelling 07/10/20   [name], MD  Continuous Blood Gluc Sensor (DEXCOM G6 SENSOR) MISC Inject 1 applicator into the skin as directed. Change sensor every 10 days. 10/16/20   Leavy Cella, RPH-CPP  Continuous Blood Gluc Transmit (DEXCOM G6 TRANSMITTER) MISC Inject 1 Device into the skin as directed. Reuse 8 times with sensor changes. 10/16/20   Leavy Cella, RPH-CPP  glucose blood (ACCU-CHEK GUIDE) test strip USE T0 CHECK BLOOD SUGAR 3 TIMES DAILY 04/18/20   Leavy Cella, RPH-CPP  Insulin Pen Needle (SURE COMFORT PEN NEEDLES) 32G X 4 MM MISC Use to inject insulin as indicated 04/17/20   Martyn Malay, MD  Lancets (ACCU-CHEK MULTICLIX) lancets 1 each by Other route as needed for other. Use as instructed 04/18/20   Leavy Cella, RPH-CPP  Misc. Devices (TRANSFER BENCH) MISC Use daily 10/29/20   [name], MD    Allergies    Cefepime, Kiwi extract, Morphine and related, Trental [pentoxifylline], and Nubain [nalbuphine hcl]  Review of Systems   Review of Systems  Constitutional: Negative for fever.  HENT: Negative for sore throat.   Respiratory: Negative for cough and shortness of breath.   Cardiovascular: Positive for chest pain.  Gastrointestinal: Negative for abdominal pain and nausea.  Genitourinary: Negative for flank pain.  Musculoskeletal: Negative for back pain.  Skin: Negative for pallor and wound.  Neurological: Negative for dizziness and headaches.  All other systems reviewed and are  negative.   Physical Exam Updated Vital Signs BP 133/79   Pulse 80   Temp 97.9 F (36.6 C) (Oral)   Resp 14   Ht 5\' 2"  (1.575 m)   Wt (!) 169.2 kg   LMP  (LMP Unknown)   SpO2 99%   BMI 68.22 kg/m   Physical Exam Vitals reviewed.  Constitutional:      Appearance: She is obese. She is ill-appearing. She is not toxic-appearing.     Comments: Chronically ill appearing.   HENT:     Head: Normocephalic and atraumatic.  Cardiovascular:     Rate and Rhythm: Normal rate.     Heart sounds: No murmur heard.   Pulmonary:     Effort: Pulmonary effort is normal. No tachypnea.     Breath sounds: No decreased breath sounds, wheezing or rhonchi.     Comments: Lungs difficult to auscultation.  Chest:     Chest wall: No tenderness.  Abdominal:     General: Abdomen is flat.     Palpations: Abdomen is soft.     Tenderness: There is no abdominal tenderness. There is no right CVA tenderness or left CVA tenderness.  Musculoskeletal:     Cervical back: Normal range of motion and neck supple.     Right lower leg: No tenderness. No edema.     Left lower leg: No tenderness. No edema.     Right Lower Extremity: Right leg is amputated below knee.     Left Lower Extremity: Left leg is amputated below knee.  Skin:    General: Skin is warm and dry.  Neurological:     Mental Status: She is alert and oriented to person, place, and time.     ED Results / Procedures / Treatments   Labs (all labs ordered are listed, but only abnormal results are displayed) Labs Reviewed  CBC WITH DIFFERENTIAL/PLATELET - Abnormal; Notable for the following components:      Result Value   RBC 3.85 (*)    Hemoglobin 11.2 (*)    All other components within normal limits  COMPREHENSIVE METABOLIC PANEL - Abnormal; Notable for the following components:   Sodium 134 (*)    Chloride 96 (*)    Glucose, Bld 399 (*)    BUN 23 (*)    Calcium 8.7 (*)    Albumin 2.7 (*)    AST 12 (*)    All other components within  normal limits  TROPONIN I (HIGH SENSITIVITY) - Abnormal; Notable for the following components:   Troponin I (High Sensitivity) 21 (*)    All other components within normal limits  TROPONIN I (HIGH SENSITIVITY) - Abnormal; Notable for the following components:   Troponin I (High Sensitivity) 20 (*)    All other components within normal limits  BRAIN NATRIURETIC PEPTIDE    EKG EKG Interpretation  Date/Time:  Monday November 17 2020 08:42:23 EST Ventricular Rate:  92 PR Interval:    QRS Duration: 105 QT Interval:  380 QTC Calculation: 471 R Axis:   122 Text Interpretation: Sinus rhythm Low voltage with right axis deviation Probable anterolateral infarct, old No acute changes No significant change since last tracing Confirmed by Varney Biles [phone]) on 11/17/2020 9:31:29 AM   Radiology DG Chest 2 View  Result Date: 11/17/2020 CLINICAL DATA:  Intermittent chest pressure for 1 month. EXAM: CHEST - 2 VIEW COMPARISON:  Chest radiograph and CTA 11/07/2020 FINDINGS: The cardiomediastinal silhouette is unchanged with normal heart size. The lungs are mildly hypoinflated with persistent mild elevation of the right hemidiaphragm and mild right basilar atelectasis. No edema, pleural effusion, or pneumothorax is identified. No acute osseous abnormality is seen. IMPRESSION: Mild right basilar atelectasis. Electronically Signed   By: Logan Bores M.D.   On: 11/17/2020 09:41    Procedures Procedures (including critical care time)  Medications Ordered in ED Medications  morphine 4 MG/ML injection 4 mg (has no administration in time range)  ondansetron (ZOFRAN) injection 4 mg (has no administration in time range)  ketorolac (TORADOL) 30 MG/ML injection 30 mg (30 mg Intravenous Given 11/17/20 1020)  ondansetron (ZOFRAN) injection 4 mg (4 mg Intravenous Given 11/17/20 1029)  morphine 4 MG/ML injection 4 mg (4 mg Intravenous Given 11/17/20 1106)    ED Course  I have reviewed the triage vital  signs and the nursing notes.  Pertinent labs & imaging results that were available during my care of the patient were reviewed by me and considered in my medical decision making (see chart for details).  Clinical Course as of 11/17/20 1303  Mon Nov 17, 2020  1259 Troponin I (High Sensitivity)(!): 20 [JS]    Clinical Course User Index [JS] Janeece Fitting, PA-C   MDM Rules/Calculators/A&P   Patient with an extensive PMH including hypertension, diabetes, CAD, presents to the ED with multiple visits for chest pain.  Today being her second visit in the past 10 days.  Patient reports this has been ongoing, she does have a schedule appointment with Dr. Burt Knack of cardiology on Wednesday.  Describes as left-sided sharp pain intermittently, alleviated somewhat with aspirin and nitro given by EMS.  She is on 2 L of O2 as needed.  Patient recently seen by pulmonology for her COPD, along with some shortness of breath.  Seem to be exacerbated with movement and walking.  According to pulmonology note from December 10, patient had stopped taking her Lasix due to not wanting to "burden daughter on help urinating ".  Interpretation of her labs by me, reveal a slightly elevation in her glucose, but consistent with her baseline.  CBC without any leukocytosis, no signs of infection.  Denies any abdominal pain, urinary symptoms.  Extensive chart review of recent studies including angio chest PE performed on November 07, 2020 showed: No pulmonary embolism. Mild coronary artery calcification. Mild left ventricular hypertrophy. Aortic Atherosclerosis (ICD10-I70.0).  Patient given Toradol for pain control, pending studies. BNP added onto workup as according to pulmonology note patient has not been taking her Lasix.  However patient reports that after the appointment she resume use of her Lasix.  She was given morphine for pain control with resolution of her symptoms.first troponin was 21, second 20. No change.   According to patient she recently had a stress test which was within normal limits.  BNP is also normal on today's visit therefore lower suspicion for heart failure etiology.  She is scheduled to have an appointment with Dr. Burt Knack on Wednesday.  1:01 PM  we discussed results of her labs along with chest x-ray.  She is encouraged to follow-up with cardiology.  She is requesting discharge home as she has another appointment to go to.  I do not suspect pain is coming from a cardiac aspect with negative delta's, recent normal stress test, lower suspicion pulmonary embolism as she was scan about 10 days ago.  return precautions were discussed at length.  Patient understands and agrees with management.    Portions of this note were generated with [employer]. Dictation errors may occur despite best attempts at proofreading.  Final Clinical Impression(s) / ED Diagnoses Final diagnoses:  Nonspecific chest pain    Rx / DC Orders ED Discharge Orders    None       Janeece Fitting, PA-C 11/17/20 [place], [place], MD 11/19/20 [phone]

## 2020-11-17 NOTE — ED Triage Notes (Signed)
Pt BIB [facility] from home c/o CP that started approximately 45 mins PTA. Pt states it feels like pressure on the left side of her chest. Pt has had this recurrent CP for over a month and was suppose to follow up with cardiology. Pt denies any SHOB, N/V or pain that radiates anywhere else.

## 2020-11-17 NOTE — Discharge Instructions (Addendum)
Your laboratory results were unchanged from your last visit.  We discussed these at length.  You will need to follow-up with cardiology at your scheduled appointment on December 15.  If you experience any worsening symptoms you may return to the emergency department.

## 2020-11-18 ENCOUNTER — Telehealth: Payer: Self-pay

## 2020-11-18 NOTE — Telephone Encounter (Signed)
Transition Care Management Follow-up Telephone Call  Date of discharge and from where: 11/17/2020 [facility]  How have you been since you were released from the hospital? Patient stated she is just fine.   Any questions or concerns? No  Items Reviewed:  Did the pt receive and understand the discharge instructions provided? Patient hung up without allowing me to finish questions.   Medications obtained and verified? No medications given.   Other? Patient hung up without allowing me to finish questions.   Any new allergies since your discharge? Patient hung up without allowing me to finish questions.   Dietary orders reviewed? Patient hung up without allowing me to finish questions.   Do you have support at home? Patient hung up without allowing me to finish questions.    Follow up appointments reviewed:   PCP Hospital f/u appt confirmed? Yes  Scheduled to see [name], MD on 12/18/2020 @ 10:10am.  [facility] f/u appt confirmed? Yes  Scheduled to see [facility] on 11/19/2020 @ 12PM.  Are transportation arrangements needed? unknown  If their condition worsens, is the pt aware to call PCP or go to the Emergency Dept.? Patient hung up without allowing me to finish questions.   Was the patient provided with contact information for the PCP's office or ED? Patient hung up without allowing me to finish questions.   Was to pt encouraged to call back with questions or concerns?Patient hung up without allowing me to finish questions.   Patient stated she is "good" and hung up the phone with out allowing me to finish asking questions.

## 2020-11-18 NOTE — Telephone Encounter (Signed)
  Unfortunately as of right now we are out of bariatric equipment without an ETA.   FYI to PCP.   Christen Bame, CMA

## 2020-11-19 ENCOUNTER — Ambulatory Visit (INDEPENDENT_AMBULATORY_CARE_PROVIDER_SITE_OTHER): Payer: Medicaid Other | Admitting: Cardiovascular Disease

## 2020-11-19 ENCOUNTER — Other Ambulatory Visit: Payer: Self-pay

## 2020-11-19 ENCOUNTER — Other Ambulatory Visit: Payer: Medicaid Other

## 2020-11-19 ENCOUNTER — Encounter: Payer: Self-pay | Admitting: Cardiovascular Disease

## 2020-11-19 VITALS — BP 120/82 | HR 98 | Ht 62.0 in | Wt 373.0 lb

## 2020-11-19 DIAGNOSIS — E1165 Type 2 diabetes mellitus with hyperglycemia: Secondary | ICD-10-CM

## 2020-11-19 DIAGNOSIS — E1151 Type 2 diabetes mellitus with diabetic peripheral angiopathy without gangrene: Secondary | ICD-10-CM

## 2020-11-19 DIAGNOSIS — R072 Precordial pain: Secondary | ICD-10-CM

## 2020-11-19 DIAGNOSIS — IMO0002 Reserved for concepts with insufficient information to code with codable children: Secondary | ICD-10-CM

## 2020-11-19 DIAGNOSIS — E1142 Type 2 diabetes mellitus with diabetic polyneuropathy: Secondary | ICD-10-CM | POA: Diagnosis not present

## 2020-11-19 MED ORDER — ASPIRIN EC 81 MG PO TBEC: 81.0000 mg | DELAYED_RELEASE_TABLET | Freq: Every day | ORAL | 3 refills | Status: DC

## 2020-11-19 NOTE — Progress Notes (Signed)
Cardiology Office Note:    Date:  11/20/2020   ID:  Kyrra Prada, DOB 19-Jan-1976, MRN [id number]  PCP:  [name], MD  [facility] Cardiologist:  Sherren Mocha, MD  [facility] Electrophysiologist:  None   Referring MD: [name], MD   Chief Complaint  Patient presents with  . Chest Pain    History of Present Illness:    Zafirah Vanzee is a 44 y.o. female with a hx of uncontrolled diabetes with hyperglycemia, complicated by diabetic ulcers, peripheral arterial disease with history of left BKA and right toe amputations.  She has a history of normal coronary arteries approximately 8 years ago by heart catheterization at that time.  The patient is morbidly obese with poor functional status.  She has had a great deal of difficulty with recurrent chest pain and shortness of breath thought to be secondary to chronic diastolic heart failure.  Her most recent echocardiogram from July 2021 showed an LVEF of 55 to 60% with no regional wall motion abnormalities.  She has had multiple hospitalizations over the last year and multiple emergency room visits for evaluation of chest pain.  During her recent hospitalization she was noted to have minimally elevated troponin at 20 with a follow-up troponin of 17.  She was treated conservatively at that time.  The patient is here with her friend and her mother today.  She continues to experience substernal chest pressure on an intermittent basis.  She is short of breath with activity and sometimes at rest.  No orthopnea or PND.  No heart palpitations or syncope.  States that her blood glucose has been under a little bit better control recently.  Past Medical History:  Diagnosis Date  . Acute osteomyelitis of ankle or foot, left ([place]) 01/31/2018  . Alveolar hypoventilation   . Anemia    not on iron pill  . Asthma   . Bipolar 2 disorder ([place])   . Carpal tunnel syndrome on right    recurrent  . Cellulitis 08/2010-08/2011  . Chronic  pain   . COPD (chronic obstructive pulmonary disease) ([name])    Symbicort daily and Proventil as needed  . Costochondritis   . Diabetes mellitus type II, uncontrolled ([place]) 2000   Type 2, Uncontrolled.Takes Lantus daily.Fasting blood sugar runs 150  . Drug-seeking behavior   . GERD (gastroesophageal reflux disease)    takes Pantoprazole and Zantac daily  . HLD (hyperlipidemia)    takes Atorvastatin daily  . Hypertension    takes Lisinopril and Coreg daily  . Morbid obesity ([place])   . Nocturia   . OSA on CPAP   . Peripheral neuropathy    takes Gabapentin daily  . Pneumonia    "walking" several yrs ago and as a baby (12/05/2018)  . Rectal fissure   . Restless leg   . SVT (supraventricular tachycardia) ([place])   . Syncope 02/25/2016  . Urinary frequency   . Urinary incontinence 10/23/2020  . Varicose veins    Right medial thigh and Left leg     Past Surgical History:  Procedure Laterality Date  . AMPUTATION Left 02/01/2018   Procedure: LEFT FOURTH AND 5TH TOE RAY AMPUTATION;  Surgeon: [name], MD;  Location: [place];  Service: Orthopedics;  Laterality: Left;  . AMPUTATION Left 03/03/2018   Procedure: LEFT BELOW KNEE AMPUTATION;  Surgeon: [name], MD;  Location: [place];  Service: Orthopedics;  Laterality: Left;  . CARPAL TUNNEL RELEASE Bilateral   . CESAREAN SECTION  2007  . INCISION AND DRAINAGE PERIRECTAL ABSCESS Left 05/18/2019   Procedure: IRRIGATION AND DEBRIDEMENT OF PANNIS ABSCESS, POSSIBLE DEBRIDEMENT OF BUTTOCK WOUND;  Surgeon: Donnie Mesa, MD;  Location: [place];  Service: General;  Laterality: Left;  . IRRIGATION AND DEBRIDEMENT BUTTOCKS Left 05/17/2019   Procedure: IRRIGATION AND DEBRIDEMENT BUTTOCKS;  Surgeon: Donnie Mesa, MD;  Location: [place];  Service: General;  Laterality: Left;  . KNEE ARTHROSCOPY Right 07/17/2010  . LEFT HEART CATHETERIZATION WITH CORONARY ANGIOGRAM N/A 07/27/2012   Procedure: LEFT HEART CATHETERIZATION WITH CORONARY ANGIOGRAM;  Surgeon:  Sherren Mocha, MD;  Location: [facility] CATH LAB;  Service: Cardiovascular;  Laterality: N/A;  . MASS EXCISION N/A 06/29/2013   Procedure:  WIDE LOCAL EXCISION OF POSTERIOR NECK ABSCESS;  Surgeon: Ralene Ok, MD;  Location: [place];  Service: General;  Laterality: N/A;  . REPAIR KNEE LIGAMENT Left    "fixed ligaments and chipped patella"  . right transmetatarsal amputation       Current Medications: Current Meds  Medication Sig  . Accu-Chek Softclix Lancets lancets Use to check blood glucose four times daily  . atorvastatin (LIPITOR) 40 MG tablet Take 1 tablet (40 mg total) by mouth daily.  . budesonide-formoterol (SYMBICORT) 160-4.5 MCG/ACT inhaler Inhale 2 puffs into the lungs 2 (two) times daily.  . Capsaicin 0.1 % CREA Apply daily as needed (Patient not taking: Reported on 11/20/2020)  . Cholecalciferol 1000 units tablet Take 1 tablet (1,000 Units total) by mouth daily.  . Compression Bandages MISC Wear daily for swelling  . Continuous Blood Gluc Sensor (DEXCOM G6 SENSOR) MISC Inject 1 applicator into the skin as directed. Change sensor every 10 days.  . Continuous Blood Gluc Transmit (DEXCOM G6 TRANSMITTER) MISC Inject 1 Device into the skin as directed. Reuse 8 times with sensor changes.  . gabapentin (NEURONTIN) 600 MG tablet TAKE 2 TABLETS BY MOUTH 3 TIMES DAILY (Patient taking differently: Take 1,200 mg by mouth 3 (three) times daily.)  . glucose blood (ACCU-CHEK GUIDE) test strip USE T0 CHECK BLOOD SUGAR 3 TIMES DAILY  . HYDROcodone-acetaminophen (NORCO) 10-325 MG tablet Take 1 tablet by mouth every 8 (eight) hours as needed for moderate pain.  Marland Kitchen insulin aspart (NOVOLOG) 100 UNIT/ML FlexPen Inject 25 Units into the skin 3 (three) times daily with meals.  . insulin glargine (LANTUS SOLOSTAR) 100 UNIT/ML Solostar Pen Inject 80 Units into the skin 2 (two) times daily.  . Insulin Pen Needle (SURE COMFORT PEN NEEDLES) 32G X 4 MM MISC Use to inject insulin as indicated  .  ipratropium-albuterol (DUONEB) 0.5-2.5 (3) MG/3ML SOLN Take 3 mLs by nebulization every 4 (four) hours as needed. (Patient taking differently: Take 3 mLs by nebulization every 4 (four) hours as needed (shortness of breath).)  . Lancets (ACCU-CHEK MULTICLIX) lancets 1 each by Other route as needed for other. Use as instructed  . loratadine (CLARITIN) 10 MG tablet Take 1 tablet (10 mg total) by mouth daily.  . metoprolol succinate (TOPROL-XL) 25 MG 24 hr tablet Take 25 mg by mouth daily.  . Misc. Devices (TRANSFER BENCH) MISC Use daily  . omeprazole (PRILOSEC) 40 MG capsule Take 1 capsule (40 mg total) by mouth daily.  . potassium chloride SA (KLOR-CON M20) 20 MEQ tablet Take 1 tablet (20 mEq total) by mouth daily.  Marland Kitchen rOPINIRole (REQUIP) 1 MG tablet Take 1 tablet (1 mg total) by mouth at bedtime.  Marland Kitchen terconazole (TERAZOL 7) 0.4 % vaginal cream Place 1 applicator vaginally at bedtime.  . Tiotropium Bromide  Monohydrate (SPIRIVA RESPIMAT) 1.25 MCG/ACT AERS Inhale 2 puffs into the lungs daily.  Marland Kitchen torsemide (DEMADEX) 20 MG tablet Take 3 tablets (60 mg total) by mouth daily.  Marland Kitchen VICTOZA 18 MG/3ML SOPN inject 0.6 MG SUBCUTANEOUSLY ONCE A DAY FOR 1 WEEK, THEN INCREASE TO 1.2 MG ONCE daily max 1.8 MG (Patient taking differently: Inject 1.2 mg into the skin daily.)  . [DISCONTINUED] fluconazole (DIFLUCAN) 150 MG tablet Take 150 mg by mouth See admin instructions. TAKE 1 TABLET BY MOUTH ONCE FOR 1 DOSE. REPEAT in 3 DAYS IF NO IMPROVEMENT     Allergies:   Cefepime, Kiwi extract, Morphine and related, Trental [pentoxifylline], and Nubain [nalbuphine hcl]   Social History   Socioeconomic History  . Marital status: Married    Spouse name: Not on file  . Number of children: Not on file  . Years of education: Not on file  . Highest education level: Not on file  Occupational History  . Not on file  Tobacco Use  . Smoking status: Former Smoker    Packs/day: 0.25    Years: 15.00    Pack years: 3.75    Types:  Cigarettes    Quit date: 12/06/2005    Years since quitting: 14.9  . Smokeless tobacco: Never Used  . Tobacco comment: smokes for a couple of months  Vaping Use  . Vaping Use: Never used  Substance and Sexual Activity  . Alcohol use: No    Alcohol/week: 0.0 standard drinks  . Drug use: Not Currently    Comment: OD attempts on home meds    . Sexual activity: Yes    Partners: Male  Other Topics Concern  . Not on file  Social History Narrative   Lives in [place] with her fiance and 42 yr old dtr.   Social Determinants of Health   Financial Resource Strain: Not on file  Food Insecurity: Not on file  Transportation Needs: Not on file  Physical Activity: Not on file  Stress: Not on file  Social Connections: Not on file     Family History: The patient's family history includes Allergic rhinitis in her mother; Anxiety disorder in her sister; Asthma in her child; Cancer in her maternal grandmother; Depression in her mother; Diabetes in her maternal grandmother and mother; GER disease in her mother; Heart attack in her father, paternal grandfather, paternal grandmother, and paternal uncle; Heart disease in her paternal grandfather and paternal grandmother; Hyperlipidemia in her maternal grandmother and mother; Hypertension in her maternal grandmother; Migraines in her sister; Other in her maternal grandfather; Restless legs syndrome in her mother.  ROS:   Please see the history of present illness.    Positive for right leg pain, mostly involving the right thigh.  All other systems reviewed and are negative.  EKGs/Labs/Other Studies Reviewed:    The following studies were reviewed today: Cardiac Cath 07-27-2012: Final Conclusions:   1. Widely patent coronary arteries 2. Normal LV systolic function 3. Elevated LVEDP  Recommendations: The patient likely has noncardiac chest pain. She does have elevated filling pressures and will require ongoing aggressive risk reduction with treatment of her  hypertension, diabetes, and efforts at weight loss. She will be eligible for discharge later this morning.  EKG:  EKG is not ordered today.  The ekg ordered 11-17-2020 demonstrates normal sinus rhythm, low voltage, possible age-indeterminate anteroseptal MI  Recent Labs: 06/17/2020: TSH 2.852 11/14/2020: Pro B Natriuretic peptide (BNP) 35.0 11/17/2020: ALT 18; B Natriuretic Peptide 28.2; BUN 23; Creatinine, Ser  0.88; Hemoglobin 11.2; Platelets 306; Potassium 4.2; Sodium 134  Recent Lipid Panel    Component Value Date/Time   CHOL 274 (H) 04/10/2020 1200   TRIG 324 (H) 04/10/2020 1200   HDL 39 (L) 04/10/2020 1200   CHOLHDL 7.0 (H) 04/10/2020 1200   CHOLHDL 6.3 (H) 01/11/2017 0909   VLDL 52 (H) 01/11/2017 0909   LDLCALC 173 (H) 04/10/2020 1200   LDLDIRECT 164.9 11/15/2014 1148     Risk Assessment/Calculations:       Physical Exam:    VS:  BP 120/82   Pulse 98   Ht 5\' 2"  (1.575 m)   Wt (!) 373 lb (169.2 kg) Comment: per patient, unable to stand  LMP  (LMP Unknown)   SpO2 92%   BMI 68.22 kg/m     Wt Readings from Last 3 Encounters:  11/19/20 (!) 373 lb (169.2 kg)  11/17/20 (!) 373 lb (169.2 kg)  11/14/20 (!) 376 lb 3.2 oz (170.6 kg)     GEN: Morbidly obese woman in no acute distress HEENT: Normal NECK: No JVD; No carotid bruits LYMPHATICS: No lymphadenopathy CARDIAC: RRR, no murmurs, rubs, gallops RESPIRATORY:  Clear to auscultation without rales, wheezing or rhonchi  ABDOMEN: Soft, non-tender, non-distended MUSCULOSKELETAL: Left BKA, right transmetatarsal amputation SKIN: Warm and dry NEUROLOGIC:  Alert and oriented x 3 PSYCHIATRIC:  Normal affect   ASSESSMENT:    1. DM (diabetes mellitus) type II uncontrolled, periph vascular disorder ([place])   2. Diabetic polyneuropathy associated with type 2 diabetes mellitus ([place])   3. Precordial pain    PLAN:    In order of problems listed above:  1. Lengthy discussion regarding diet and lifestyle modification,  medication compliance, and focus on control of diabetes.  Treated by her endocrinologist and primary physicians.  Complains of right leg pain, history of known vascular disease.  Recommend bilateral lower extremity arterial duplex for further evaluation. 2. As above 3. Very difficult to sort out this patient's chest pain syndrome.  She is at high risk of developing coronary disease over a 7 to 8-year.  Since the time of her last cardiac catheterization.  Considering her comorbidities of uncontrolled diabetes and morbid obesity, she needs some kind of ischemic evaluation.  I do not think it is possible to perform noninvasive stress testing because of her body habitus.  We discussed ongoing observation versus consideration of cardiac catheterization.  The patient favors a definitive evaluation and I think this is reasonable.  Would plan radial cardiac catheterization.  I do not think I would access her femoral arteries under any circumstances unless there was some critical complication.  We will need her access to the right or the left radial artery.  On my exam, the left radial artery is easier to palpate. I have reviewed the risks, indications, and alternatives to cardiac catheterization, possible angioplasty, and stenting with the patient. Risks include but are not limited to bleeding, infection, vascular injury, stroke, myocardial infection, arrhythmia, kidney injury, radiation-related injury in the case of prolonged fluoroscopy use, emergency cardiac surgery, and death. The patient understands the risks of serious complication is 1-2 in 2703 with diagnostic cardiac cath and 1-2% or less with angioplasty/stenting.    Shared Decision Making/Informed Consent The risks [stroke (1 in 1000), death (1 in 1000), kidney failure [usually temporary] (1 in 500), bleeding (1 in 200), allergic reaction [possibly serious] (1 in 200)], benefits (diagnostic support and management of coronary artery disease) and alternatives  of a cardiac catheterization were discussed in detail  with [name] and she is willing to proceed.  Medication Adjustments/Labs and Tests Ordered: Current medicines are reviewed at length with the patient today.  Concerns regarding medicines are outlined above.  Orders Placed This Encounter  Procedures  . VAS [place] LOWER EXTREMITY ARTERIAL DUPLEX  . VAS [place] ABI WITH/WO TBI   Meds ordered this encounter  Medications  . aspirin EC 81 MG tablet    Sig: Take 1 tablet (81 mg total) by mouth daily. Swallow whole.    Dispense:  90 tablet    Refill:  3    Patient Instructions  Medication Instructions:  1) START ASPIRIN 81 mg daily *If you need a refill on your cardiac medications before your next appointment, please call your pharmacy*  Testing/Procedures: Your physician has requested that you have a cardiac catheterization. Cardiac catheterization is used to diagnose and/or treat various heart conditions. Doctors may recommend this procedure for a number of different reasons. The most common reason is to evaluate chest pain. Chest pain can be a symptom of coronary artery disease (CAD), and cardiac catheterization can show whether plaque is narrowing or blocking your heart's arteries. This procedure is also used to evaluate the valves, as well as measure the blood flow and oxygen levels in different parts of your heart. For further information please visit [facility]iest[facility]. Please follow instruction sheet, as given.  Testing: Your provider has requested that you have a lower extremity arterial duplex. During this test, ultrasound is used to evaluate arterial blood flow in the legs. Allow one hour for this exam. There are no restrictions or special instructions.   Follow-Up: You are scheduled for a follow-up appointment with Truitt Merle on December 09, 2020 at 3:15PM.       Signed, Sherren Mocha, MD  11/20/2020 5:23 PM    [place]

## 2020-11-19 NOTE — Patient Instructions (Addendum)
Medication Instructions:  1) START ASPIRIN 81 mg daily *If you need a refill on your cardiac medications before your next appointment, please call your pharmacy*  Testing/Procedures: Your physician has requested that you have a cardiac catheterization. Cardiac catheterization is used to diagnose and/or treat various heart conditions. Doctors may recommend this procedure for a number of different reasons. The most common reason is to evaluate chest pain. Chest pain can be a symptom of coronary artery disease (CAD), and cardiac catheterization can show whether plaque is narrowing or blocking your heart's arteries. This procedure is also used to evaluate the valves, as well as measure the blood flow and oxygen levels in different parts of your heart. For further information please visit [facility]tn. Please follow instruction sheet, as given.  Testing: Your provider has requested that you have a lower extremity arterial duplex. During this test, ultrasound is used to evaluate arterial blood flow in the legs. Allow one hour for this exam. There are no restrictions or special instructions.   Follow-Up: You are scheduled for a follow-up appointment with [name] on December 09, 2020 at 3:15PM.

## 2020-11-19 NOTE — H&P (View-Only) (Signed)
Cardiology Office Note:    Date:  11/20/2020   ID:  [name], DOB 02/02/1976, MRN [id number]  PCP:  [name], MD  [facility] Cardiologist:  Sherren Mocha, MD  [facility] Electrophysiologist:  None   Referring MD: [name], MD   Chief Complaint  Patient presents with  . Chest Pain    History of Present Illness:    [name] is a 44 y.o. female with a hx of uncontrolled diabetes with hyperglycemia, complicated by diabetic ulcers, peripheral arterial disease with history of left BKA and right toe amputations.  She has a history of normal coronary arteries approximately 8 years ago by heart catheterization at that time.  The patient is morbidly obese with poor functional status.  She has had a great deal of difficulty with recurrent chest pain and shortness of breath thought to be secondary to chronic diastolic heart failure.  Her most recent echocardiogram from July 2021 showed an LVEF of 55 to 60% with no regional wall motion abnormalities.  She has had multiple hospitalizations over the last year and multiple emergency room visits for evaluation of chest pain.  During her recent hospitalization she was noted to have minimally elevated troponin at 20 with a follow-up troponin of 17.  She was treated conservatively at that time.  The patient is here with her friend and her mother today.  She continues to experience substernal chest pressure on an intermittent basis.  She is short of breath with activity and sometimes at rest.  No orthopnea or PND.  No heart palpitations or syncope.  States that her blood glucose has been under a little bit better control recently.  Past Medical History:  Diagnosis Date  . Acute osteomyelitis of ankle or foot, left ([place]) 01/31/2018  . Alveolar hypoventilation   . Anemia    not on iron pill  . Asthma   . Bipolar 2 disorder ([place])   . Carpal tunnel syndrome on right    recurrent  . Cellulitis 08/2010-08/2011  . Chronic  pain   . COPD (chronic obstructive pulmonary disease) ([name])    Symbicort daily and Proventil as needed  . Costochondritis   . Diabetes mellitus type II, uncontrolled ([place]) 2000   Type 2, Uncontrolled.Takes Lantus daily.Fasting blood sugar runs 150  . Drug-seeking behavior   . GERD (gastroesophageal reflux disease)    takes Pantoprazole and Zantac daily  . HLD (hyperlipidemia)    takes Atorvastatin daily  . Hypertension    takes Lisinopril and Coreg daily  . Morbid obesity ([place])   . Nocturia   . OSA on CPAP   . Peripheral neuropathy    takes Gabapentin daily  . Pneumonia    "walking" several yrs ago and as a baby (12/05/2018)  . Rectal fissure   . Restless leg   . SVT (supraventricular tachycardia) ([place])   . Syncope 02/25/2016  . Urinary frequency   . Urinary incontinence 10/23/2020  . Varicose veins    Right medial thigh and Left leg     Past Surgical History:  Procedure Laterality Date  . AMPUTATION Left 02/01/2018   Procedure: LEFT FOURTH AND 5TH TOE RAY AMPUTATION;  Surgeon: [name], MD;  Location: [facility];  Service: Orthopedics;  Laterality: Left;  . AMPUTATION Left 03/03/2018   Procedure: LEFT BELOW KNEE AMPUTATION;  Surgeon: [name], MD;  Location: [facility];  Service: Orthopedics;  Laterality: Left;  . CARPAL TUNNEL RELEASE Bilateral   . CESAREAN SECTION  2007  . INCISION AND DRAINAGE PERIRECTAL ABSCESS Left 05/18/2019   Procedure: IRRIGATION AND DEBRIDEMENT OF PANNIS ABSCESS, POSSIBLE DEBRIDEMENT OF BUTTOCK WOUND;  Surgeon: Donnie Mesa, MD;  Location: [facility];  Service: General;  Laterality: Left;  . IRRIGATION AND DEBRIDEMENT BUTTOCKS Left 05/17/2019   Procedure: IRRIGATION AND DEBRIDEMENT BUTTOCKS;  Surgeon: Donnie Mesa, MD;  Location: [facility];  Service: General;  Laterality: Left;  . KNEE ARTHROSCOPY Right 07/17/2010  . LEFT HEART CATHETERIZATION WITH CORONARY ANGIOGRAM N/A 07/27/2012   Procedure: LEFT HEART CATHETERIZATION WITH CORONARY ANGIOGRAM;  Surgeon:  Sherren Mocha, MD;  Location: [facility] CATH LAB;  Service: Cardiovascular;  Laterality: N/A;  . MASS EXCISION N/A 06/29/2013   Procedure:  WIDE LOCAL EXCISION OF POSTERIOR NECK ABSCESS;  Surgeon: Ralene Ok, MD;  Location: [facility];  Service: General;  Laterality: N/A;  . REPAIR KNEE LIGAMENT Left    "fixed ligaments and chipped patella"  . right transmetatarsal amputation       Current Medications: Current Meds  Medication Sig  . Accu-Chek Softclix Lancets lancets Use to check blood glucose four times daily  . atorvastatin (LIPITOR) 40 MG tablet Take 1 tablet (40 mg total) by mouth daily.  . budesonide-formoterol (SYMBICORT) 160-4.5 MCG/ACT inhaler Inhale 2 puffs into the lungs 2 (two) times daily.  . Capsaicin 0.1 % CREA Apply daily as needed (Patient not taking: Reported on 11/20/2020)  . Cholecalciferol 1000 units tablet Take 1 tablet (1,000 Units total) by mouth daily.  . Compression Bandages MISC Wear daily for swelling  . Continuous Blood Gluc Sensor (DEXCOM G6 SENSOR) MISC Inject 1 applicator into the skin as directed. Change sensor every 10 days.  . Continuous Blood Gluc Transmit (DEXCOM G6 TRANSMITTER) MISC Inject 1 Device into the skin as directed. Reuse 8 times with sensor changes.  . gabapentin (NEURONTIN) 600 MG tablet TAKE 2 TABLETS BY MOUTH 3 TIMES DAILY (Patient taking differently: Take 1,200 mg by mouth 3 (three) times daily.)  . glucose blood (ACCU-CHEK GUIDE) test strip USE T0 CHECK BLOOD SUGAR 3 TIMES DAILY  . HYDROcodone-acetaminophen (NORCO) 10-325 MG tablet Take 1 tablet by mouth every 8 (eight) hours as needed for moderate pain.  Marland Kitchen insulin aspart (NOVOLOG) 100 UNIT/ML FlexPen Inject 25 Units into the skin 3 (three) times daily with meals.  . insulin glargine (LANTUS SOLOSTAR) 100 UNIT/ML Solostar Pen Inject 80 Units into the skin 2 (two) times daily.  . Insulin Pen Needle (SURE COMFORT PEN NEEDLES) 32G X 4 MM MISC Use to inject insulin as indicated  .  ipratropium-albuterol (DUONEB) 0.5-2.5 (3) MG/3ML SOLN Take 3 mLs by nebulization every 4 (four) hours as needed. (Patient taking differently: Take 3 mLs by nebulization every 4 (four) hours as needed (shortness of breath).)  . Lancets (ACCU-CHEK MULTICLIX) lancets 1 each by Other route as needed for other. Use as instructed  . loratadine (CLARITIN) 10 MG tablet Take 1 tablet (10 mg total) by mouth daily.  . metoprolol succinate (TOPROL-XL) 25 MG 24 hr tablet Take 25 mg by mouth daily.  . Misc. Devices (TRANSFER BENCH) MISC Use daily  . omeprazole (PRILOSEC) 40 MG capsule Take 1 capsule (40 mg total) by mouth daily.  . potassium chloride SA (KLOR-CON M20) 20 MEQ tablet Take 1 tablet (20 mEq total) by mouth daily.  Marland Kitchen rOPINIRole (REQUIP) 1 MG tablet Take 1 tablet (1 mg total) by mouth at bedtime.  Marland Kitchen terconazole (TERAZOL 7) 0.4 % vaginal cream Place 1 applicator vaginally at bedtime.  . Tiotropium Bromide  Monohydrate (SPIRIVA RESPIMAT) 1.25 MCG/ACT AERS Inhale 2 puffs into the lungs daily.  Marland Kitchen torsemide (DEMADEX) 20 MG tablet Take 3 tablets (60 mg total) by mouth daily.  Marland Kitchen VICTOZA 18 MG/3ML SOPN inject 0.6 MG SUBCUTANEOUSLY ONCE A DAY FOR 1 WEEK, THEN INCREASE TO 1.2 MG ONCE daily max 1.8 MG (Patient taking differently: Inject 1.2 mg into the skin daily.)  . [DISCONTINUED] fluconazole (DIFLUCAN) 150 MG tablet Take 150 mg by mouth See admin instructions. TAKE 1 TABLET BY MOUTH ONCE FOR 1 DOSE. REPEAT in 3 DAYS IF NO IMPROVEMENT     Allergies:   Cefepime, Kiwi extract, Morphine and related, Trental [pentoxifylline], and Nubain [nalbuphine hcl]   Social History   Socioeconomic History  . Marital status: Married    Spouse name: Not on file  . Number of children: Not on file  . Years of education: Not on file  . Highest education level: Not on file  Occupational History  . Not on file  Tobacco Use  . Smoking status: Former Smoker    Packs/day: 0.25    Years: 15.00    Pack years: 3.75    Types:  Cigarettes    Quit date: 12/06/2005    Years since quitting: 14.9  . Smokeless tobacco: Never Used  . Tobacco comment: smokes for a couple of months  Vaping Use  . Vaping Use: Never used  Substance and Sexual Activity  . Alcohol use: No    Alcohol/week: 0.0 standard drinks  . Drug use: Not Currently    Comment: OD attempts on home meds    . Sexual activity: Yes    Partners: Male  Other Topics Concern  . Not on file  Social History Narrative   Lives in [place] with her fiance and 42 yr old dtr.   Social Determinants of Health   Financial Resource Strain: Not on file  Food Insecurity: Not on file  Transportation Needs: Not on file  Physical Activity: Not on file  Stress: Not on file  Social Connections: Not on file     Family History: The patient's family history includes Allergic rhinitis in her mother; Anxiety disorder in her sister; Asthma in her child; Cancer in her maternal grandmother; Depression in her mother; Diabetes in her maternal grandmother and mother; GER disease in her mother; Heart attack in her father, paternal grandfather, paternal grandmother, and paternal uncle; Heart disease in her paternal grandfather and paternal grandmother; Hyperlipidemia in her maternal grandmother and mother; Hypertension in her maternal grandmother; Migraines in her sister; Other in her maternal grandfather; Restless legs syndrome in her mother.  ROS:   Please see the history of present illness.    Positive for right leg pain, mostly involving the right thigh.  All other systems reviewed and are negative.  EKGs/Labs/Other Studies Reviewed:    The following studies were reviewed today: Cardiac Cath 07-27-2012: Final Conclusions:   1. Widely patent coronary arteries 2. Normal LV systolic function 3. Elevated LVEDP  Recommendations: The patient likely has noncardiac chest pain. She does have elevated filling pressures and will require ongoing aggressive risk reduction with treatment of her  hypertension, diabetes, and efforts at weight loss. She will be eligible for discharge later this morning.  EKG:  EKG is not ordered today.  The ekg ordered 11-17-2020 demonstrates normal sinus rhythm, low voltage, possible age-indeterminate anteroseptal MI  Recent Labs: 06/17/2020: TSH 2.852 11/14/2020: Pro B Natriuretic peptide (BNP) 35.0 11/17/2020: ALT 18; B Natriuretic Peptide 28.2; BUN 23; Creatinine, Ser  0.88; Hemoglobin 11.2; Platelets 306; Potassium 4.2; Sodium 134  Recent Lipid Panel    Component Value Date/Time   CHOL 274 (H) 04/10/2020 1200   TRIG 324 (H) 04/10/2020 1200   HDL 39 (L) 04/10/2020 1200   CHOLHDL 7.0 (H) 04/10/2020 1200   CHOLHDL 6.3 (H) 01/11/2017 0909   VLDL 52 (H) 01/11/2017 0909   LDLCALC 173 (H) 04/10/2020 1200   LDLDIRECT 164.9 11/15/2014 1148     Risk Assessment/Calculations:       Physical Exam:    VS:  BP 120/82   Pulse 98   Ht 5\' 2"  (1.575 m)   Wt (!) 373 lb (169.2 kg) Comment: per patient, unable to stand  LMP  (LMP Unknown)   SpO2 92%   BMI 68.22 kg/m     Wt Readings from Last 3 Encounters:  11/19/20 (!) 373 lb (169.2 kg)  11/17/20 (!) 373 lb (169.2 kg)  11/14/20 (!) 376 lb 3.2 oz (170.6 kg)     GEN: Morbidly obese woman in no acute distress HEENT: Normal NECK: No JVD; No carotid bruits LYMPHATICS: No lymphadenopathy CARDIAC: RRR, no murmurs, rubs, gallops RESPIRATORY:  Clear to auscultation without rales, wheezing or rhonchi  ABDOMEN: Soft, non-tender, non-distended MUSCULOSKELETAL: Left BKA, right transmetatarsal amputation SKIN: Warm and dry NEUROLOGIC:  Alert and oriented x 3 PSYCHIATRIC:  Normal affect   ASSESSMENT:    1. DM (diabetes mellitus) type II uncontrolled, periph vascular disorder ([place])   2. Diabetic polyneuropathy associated with type 2 diabetes mellitus ([place])   3. Precordial pain    PLAN:    In order of problems listed above:  1. Lengthy discussion regarding diet and lifestyle modification,  medication compliance, and focus on control of diabetes.  Treated by her endocrinologist and primary physicians.  Complains of right leg pain, history of known vascular disease.  Recommend bilateral lower extremity arterial duplex for further evaluation. 2. As above 3. Very difficult to sort out this patient's chest pain syndrome.  She is at high risk of developing coronary disease over a 7 to 8-year.  Since the time of her last cardiac catheterization.  Considering her comorbidities of uncontrolled diabetes and morbid obesity, she needs some kind of ischemic evaluation.  I do not think it is possible to perform noninvasive stress testing because of her body habitus.  We discussed ongoing observation versus consideration of cardiac catheterization.  The patient favors a definitive evaluation and I think this is reasonable.  Would plan radial cardiac catheterization.  I do not think I would access her femoral arteries under any circumstances unless there was some critical complication.  We will need her access to the right or the left radial artery.  On my exam, the left radial artery is easier to palpate. I have reviewed the risks, indications, and alternatives to cardiac catheterization, possible angioplasty, and stenting with the patient. Risks include but are not limited to bleeding, infection, vascular injury, stroke, myocardial infection, arrhythmia, kidney injury, radiation-related injury in the case of prolonged fluoroscopy use, emergency cardiac surgery, and death. The patient understands the risks of serious complication is 1-2 in 6789 with diagnostic cardiac cath and 1-2% or less with angioplasty/stenting.    Shared Decision Making/Informed Consent The risks [stroke (1 in 1000), death (1 in 1000), kidney failure [usually temporary] (1 in 500), bleeding (1 in 200), allergic reaction [possibly serious] (1 in 200)], benefits (diagnostic support and management of coronary artery disease) and alternatives  of a cardiac catheterization were discussed in detail  with [name] and she is willing to proceed.  Medication Adjustments/Labs and Tests Ordered: Current medicines are reviewed at length with the patient today.  Concerns regarding medicines are outlined above.  Orders Placed This Encounter  Procedures  . VAS [place] LOWER EXTREMITY ARTERIAL DUPLEX  . VAS [place] ABI WITH/WO TBI   Meds ordered this encounter  Medications  . aspirin EC 81 MG tablet    Sig: Take 1 tablet (81 mg total) by mouth daily. Swallow whole.    Dispense:  90 tablet    Refill:  3    Patient Instructions  Medication Instructions:  1) START ASPIRIN 81 mg daily *If you need a refill on your cardiac medications before your next appointment, please call your pharmacy*  Testing/Procedures: Your physician has requested that you have a cardiac catheterization. Cardiac catheterization is used to diagnose and/or treat various heart conditions. Doctors may recommend this procedure for a number of different reasons. The most common reason is to evaluate chest pain. Chest pain can be a symptom of coronary artery disease (CAD), and cardiac catheterization can show whether plaque is narrowing or blocking your heart's arteries. This procedure is also used to evaluate the valves, as well as measure the blood flow and oxygen levels in different parts of your heart. For further information please visit [email or web address]. Please follow instruction sheet, as given.  Testing: Your provider has requested that you have a lower extremity arterial duplex. During this test, ultrasound is used to evaluate arterial blood flow in the legs. Allow one hour for this exam. There are no restrictions or special instructions.   Follow-Up: You are scheduled for a follow-up appointment with Truitt Merle on December 09, 2020 at 3:15PM.       Signed, Sherren Mocha, MD  11/20/2020 5:23 PM    [name]

## 2020-11-20 ENCOUNTER — Encounter (HOSPITAL_BASED_OUTPATIENT_CLINIC_OR_DEPARTMENT_OTHER): Payer: Medicaid Other | Admitting: Internal Medicine

## 2020-11-20 ENCOUNTER — Encounter: Payer: Self-pay | Admitting: Family Medicine

## 2020-11-20 ENCOUNTER — Encounter: Payer: Self-pay | Admitting: Cardiovascular Disease

## 2020-11-23 ENCOUNTER — Emergency Department (HOSPITAL_COMMUNITY)
Admission: EM | Admit: 2020-11-23 | Discharge: 2020-11-23 | Disposition: A | Discharge status: Home / Self Care | Payer: Medicaid Other | Attending: Emergency Medicine | Admitting: Emergency Medicine

## 2020-11-23 ENCOUNTER — Other Ambulatory Visit: Payer: Self-pay

## 2020-11-23 ENCOUNTER — Encounter (HOSPITAL_COMMUNITY): Payer: Self-pay

## 2020-11-23 DIAGNOSIS — S80861A Insect bite (nonvenomous), right lower leg, initial encounter: Secondary | ICD-10-CM | POA: Diagnosis not present

## 2020-11-23 DIAGNOSIS — Z7982 Long term (current) use of aspirin: Secondary | ICD-10-CM | POA: Insufficient documentation

## 2020-11-23 DIAGNOSIS — Y92013 Bedroom of single-family (private) house as the place of occurrence of the external cause: Secondary | ICD-10-CM | POA: Insufficient documentation

## 2020-11-23 DIAGNOSIS — Z89422 Acquired absence of other left toe(s): Secondary | ICD-10-CM | POA: Insufficient documentation

## 2020-11-23 DIAGNOSIS — I10 Essential (primary) hypertension: Secondary | ICD-10-CM | POA: Insufficient documentation

## 2020-11-23 DIAGNOSIS — Z79899 Other long term (current) drug therapy: Secondary | ICD-10-CM | POA: Insufficient documentation

## 2020-11-23 DIAGNOSIS — Z87891 Personal history of nicotine dependence: Secondary | ICD-10-CM | POA: Diagnosis not present

## 2020-11-23 DIAGNOSIS — Z9861 Coronary angioplasty status: Secondary | ICD-10-CM | POA: Insufficient documentation

## 2020-11-23 DIAGNOSIS — Z794 Long term (current) use of insulin: Secondary | ICD-10-CM | POA: Diagnosis not present

## 2020-11-23 DIAGNOSIS — J45909 Unspecified asthma, uncomplicated: Secondary | ICD-10-CM | POA: Diagnosis not present

## 2020-11-23 DIAGNOSIS — J449 Chronic obstructive pulmonary disease, unspecified: Secondary | ICD-10-CM | POA: Diagnosis not present

## 2020-11-23 DIAGNOSIS — M79604 Pain in right leg: Secondary | ICD-10-CM | POA: Diagnosis present

## 2020-11-23 DIAGNOSIS — Z89512 Acquired absence of left leg below knee: Secondary | ICD-10-CM | POA: Insufficient documentation

## 2020-11-23 DIAGNOSIS — W57XXXA Bitten or stung by nonvenomous insect and other nonvenomous arthropods, initial encounter: Secondary | ICD-10-CM | POA: Insufficient documentation

## 2020-11-23 DIAGNOSIS — E119 Type 2 diabetes mellitus without complications: Secondary | ICD-10-CM | POA: Diagnosis not present

## 2020-11-23 DIAGNOSIS — R52 Pain, unspecified: Secondary | ICD-10-CM | POA: Diagnosis not present

## 2020-11-23 MED ORDER — DOXYCYCLINE HYCLATE 100 MG PO CAPS: 100.0000 mg | ORAL_CAPSULE | Freq: Two times a day (BID) | ORAL | 0 refills | Status: DC

## 2020-11-23 MED ORDER — DOXYCYCLINE HYCLATE 100 MG PO TABS
100.0000 mg | ORAL_TABLET | Freq: Once | ORAL | Status: AC
  Administered 2020-11-23: 100 mg via ORAL
  Filled 2020-11-23: qty 1

## 2020-11-23 NOTE — ED Notes (Signed)
Pt asked to sit on the side of the bed. This RN explained that it was a fall risk for her to sit on the side of the bed. Pt said she could not breathe unless she was sitting on the side of the bed. This RN and Miquela, NT explained to the patient that we could sit her up higher on the stretcher but it was unsafe for her to sit on the side. The patient then cursed and said "fine, just let me lay here and suffocate." Pt's O2 sats remained in the mid 90s the entire time and she displayed no increased work of breathing.

## 2020-11-23 NOTE — Discharge Instructions (Addendum)
It is likely you have an insect bite on the back of your leg. There may be some secondary infection there or the redness may be from inflammation. Because of your diabetes history, you have been given antibiotics. Take them as directed. It is important to follow up with your doctor for recheck in 2 days.

## 2020-11-23 NOTE — ED Provider Notes (Signed)
[facility] DEPT Provider Note   CSN: [id number] Arrival date & time: 11/23/20  0220     History Chief Complaint  Patient presents with  . Insect Bite    Spider bite    [name] is a 44 y.o. female.  Patient to ED with painful area to the posterior right calf that started last evening (12/18). She saw a spider in the bed as well and feels she may have been bitten. There is pain localized to the site but no other pain. No bleeding or drainage.   The history is provided by the patient. No language interpreter was used.       Past Medical History:  Diagnosis Date  . Acute osteomyelitis of ankle or foot, left ([place]) 01/31/2018  . Alveolar hypoventilation   . Anemia    not on iron pill  . Asthma   . Bipolar 2 disorder ([place])   . Carpal tunnel syndrome on right    recurrent  . Cellulitis 08/2010-08/2011  . Chronic pain   . COPD (chronic obstructive pulmonary disease) ([name])    Symbicort daily and Proventil as needed  . Costochondritis   . Diabetes mellitus type II, uncontrolled ([place]) 2000   Type 2, Uncontrolled.Takes Lantus daily.Fasting blood sugar runs 150  . Drug-seeking behavior   . GERD (gastroesophageal reflux disease)    takes Pantoprazole and Zantac daily  . HLD (hyperlipidemia)    takes Atorvastatin daily  . Hypertension    takes Lisinopril and Coreg daily  . Morbid obesity ([place])   . Nocturia   . OSA on CPAP   . Peripheral neuropathy    takes Gabapentin daily  . Pneumonia    "walking" several yrs ago and as a baby (12/05/2018)  . Rectal fissure   . Restless leg   . SVT (supraventricular tachycardia) ([place])   . Syncope 02/25/2016  . Urinary frequency   . Urinary incontinence 10/23/2020  . Varicose veins    Right medial thigh and Left leg     Patient Active Problem List   Diagnosis Date Noted  . Urinary incontinence 10/23/2020  . Elevated troponin   . Acute respiratory failure with hypoxia and hypercarbia ([place])  06/15/2020  . Chronic respiratory failure with hypoxia ([place]) 06/14/2020  . Blister of foot 04/21/2020  . Exposure to mold 04/21/2020  . Constipation 04/03/2020  . Left shoulder pain 06/23/2019  . Hyperglycemia due to diabetes mellitus ([place])   . 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[place]) 04/11/2019  . Allergic rhinitis 03/06/2018  . Class 3 severe obesity due to excess calories with serious comorbidity and body mass index (BMI) of 50.0 to 59.9 in adult [facility])   . Decreased pedal pulses 06/10/2017  . Unilateral primary osteoarthritis, right knee 10/22/2016  . Primary osteoarthritis of first carpometacarpal joint of left hand 07/30/2016  . Diabetic neuropathy ([place]) 07/14/2016  . Nausea 03/17/2016  . De Quervain's tenosynovitis, bilateral 11/01/2015  . Vitamin D deficiency 09/05/2015  . Recurrent candidiasis of vagina 09/05/2015  . Varicose veins of leg with complications 40/98/1191  . Restless leg syndrome 10/17/2014  . Chronic sinusitis 07/18/2014  . Encounter for chronic pain management 06/30/2013  . HLD (hyperlipidemia) 11/19/2012  . Nonspecific chest pain 06/27/2012  . Abscess of skin and subcutaneous tissue 11/04/2011  . Right carpal tunnel syndrome 09/01/2011  . Bilateral knee pain 09/01/2011  . DM (diabetes mellitus) type II uncontrolled, periph vascular disorder ([place]) 05/22/2008  . Morbid obesity ([place]) 05/22/2008  . Obesity hypoventilation syndrome ([place])  05/22/2008  . Mood disorder ([place]) 05/22/2008  . Obstructive sleep apnea 05/22/2008  . Hypertension 05/22/2008  . Asthma 05/22/2008  . GERD 05/22/2008    Past Surgical History:  Procedure Laterality Date  . AMPUTATION Left 02/01/2018   Procedure: LEFT FOURTH AND 5TH TOE RAY AMPUTATION;  Surgeon: [name], MD;  Location: [facility];  Service: Orthopedics;  Laterality: Left;  . AMPUTATION Left 03/03/2018   Procedure: LEFT BELOW KNEE AMPUTATION;  Surgeon: [name], MD;  Location: [facility];  Service: Orthopedics;  Laterality: Left;  .  CARPAL TUNNEL RELEASE Bilateral   . CESAREAN SECTION  2007  . INCISION AND DRAINAGE PERIRECTAL ABSCESS Left 05/18/2019   Procedure: IRRIGATION AND DEBRIDEMENT OF PANNIS ABSCESS, POSSIBLE DEBRIDEMENT OF BUTTOCK WOUND;  Surgeon: Donnie Mesa, MD;  Location: [facility];  Service: General;  Laterality: Left;  . IRRIGATION AND DEBRIDEMENT BUTTOCKS Left 05/17/2019   Procedure: IRRIGATION AND DEBRIDEMENT BUTTOCKS;  Surgeon: Donnie Mesa, MD;  Location: [facility];  Service: General;  Laterality: Left;  . KNEE ARTHROSCOPY Right 07/17/2010  . LEFT HEART CATHETERIZATION WITH CORONARY ANGIOGRAM N/A 07/27/2012   Procedure: LEFT HEART CATHETERIZATION WITH CORONARY ANGIOGRAM;  Surgeon: Sherren Mocha, MD;  Location[facility] CATH LAB;  Service: Cardiovascular;  Laterality: N/A;  . MASS EXCISION N/A 06/29/2013   Procedure:  WIDE LOCAL EXCISION OF POSTERIOR NECK ABSCESS;  Surgeon: Ralene Ok, MD;  Location: [facility];  Service: General;  Laterality: N/A;  . REPAIR KNEE LIGAMENT Left    "fixed ligaments and chipped patella"  . right transmetatarsal amputation        OB History    Gravida  2   Para  1   Term      Preterm      AB  1   Living  1     SAB  1   IAB      Ectopic      Multiple      Live Births              Family History  Problem Relation Age of Onset  . Diabetes Mother   . Hyperlipidemia Mother   . Depression Mother   . GER disease Mother   . Allergic rhinitis Mother   . Restless legs syndrome Mother   . Heart attack Paternal Uncle   . Heart disease Paternal Grandmother   . Heart attack Paternal Grandmother   . Heart attack Paternal Grandfather   . Heart disease Paternal Grandfather   . Heart attack Father   . Migraines Sister   . Cancer Maternal Grandmother        COLON  . Hypertension Maternal Grandmother   . Hyperlipidemia Maternal Grandmother   . Diabetes Maternal Grandmother   . Other Maternal Grandfather        GUN SHOT  . Anxiety disorder Sister   . Asthma Child      Social History   Tobacco Use  . Smoking status: Former Smoker    Packs/day: 0.25    Years: 15.00    Pack years: 3.75    Types: Cigarettes    Quit date: 12/06/2005    Years since quitting: 14.9  . Smokeless tobacco: Never Used  . Tobacco comment: smokes for a couple of months  Vaping Use  . Vaping Use: Never used  Substance Use Topics  . Alcohol use: No    Alcohol/week: 0.0 standard drinks  . Drug use: Not Currently    Comment: OD attempts on  home meds      Home Medications Prior to Admission medications   Medication Sig Start Date End Date Taking? Authorizing Provider  Accu-Chek Softclix Lancets lancets Use to check blood glucose four times daily 04/17/20   Martyn Malay, MD  aspirin EC 81 MG tablet Take 1 tablet (81 mg total) by mouth daily. Swallow whole. 11/19/20   Sherren Mocha, MD  atorvastatin (LIPITOR) 40 MG tablet Take 1 tablet (40 mg total) by mouth daily. 04/11/20   [name], MD  budesonide-formoterol [facility]) 160-4.5 MCG/ACT inhaler Inhale 2 puffs into the lungs 2 (two) times daily. 05/09/20   [name], MD  Capsaicin 0.1 % CREA Apply daily as needed Patient not taking: Reported on 11/20/2020 11/11/20   [name], MD  Cholecalciferol 1000 units tablet Take 1 tablet (1,000 Units total) by mouth daily. 04/05/17   [name], MD  Compression Bandages MISC Wear daily for swelling 07/10/20   [name], MD  Continuous Blood Gluc Sensor (DEXCOM G6 SENSOR) MISC Inject 1 applicator into the skin as directed. Change sensor every 10 days. 10/16/20   Leavy Cella, RPH-CPP  Continuous Blood Gluc Transmit (DEXCOM G6 TRANSMITTER) MISC Inject 1 Device into the skin as directed. Reuse 8 times with sensor changes. 10/16/20   Leavy Cella, RPH-CPP  gabapentin (NEURONTIN) 600 MG tablet TAKE 2 TABLETS BY MOUTH 3 TIMES DAILY Patient taking differently: Take 1,200 mg by mouth 3 (three) times daily. 10/20/20   [name], MD   glucose blood (ACCU-CHEK GUIDE) test strip USE T0 CHECK BLOOD SUGAR 3 TIMES DAILY 04/18/20   Leavy Cella, RPH-CPP  HYDROcodone-acetaminophen (NORCO) 10-325 MG tablet Take 1 tablet by mouth every 8 (eight) hours as needed for moderate pain. 11/11/20   [name], MD  insulin aspart (NOVOLOG) 100 UNIT/ML FlexPen Inject 25 Units into the skin 3 (three) times daily with meals. 11/11/20   [name], MD  insulin glargine (LANTUS SOLOSTAR) 100 UNIT/ML Solostar Pen Inject 80 Units into the skin 2 (two) times daily. 11/11/20   [name], MD  Insulin Pen Needle (SURE COMFORT PEN NEEDLES) 32G X 4 MM MISC Use to inject insulin as indicated 04/17/20   Martyn Malay, MD  ipratropium-albuterol (DUONEB) 0.5-2.5 (3) MG/3ML SOLN Take 3 mLs by nebulization every 4 (four) hours as needed. Patient taking differently: Take 3 mLs by nebulization every 4 (four) hours as needed (shortness of breath). 06/25/20   [name], MD  Lancets (ACCU-CHEK MULTICLIX) lancets 1 each by Other route as needed for other. Use as instructed 04/18/20   Leavy Cella, RPH-CPP  loratadine (CLARITIN) 10 MG tablet Take 1 tablet (10 mg total) by mouth daily. 11/14/20   Martyn Ehrich, NP  metoprolol succinate (TOPROL-XL) 25 MG 24 hr tablet Take 25 mg by mouth daily. 10/23/20   [provider]  Misc. Devices (TRANSFER BENCH) MISC Use daily 10/29/20   [name], MD  omeprazole (PRILOSEC) 40 MG capsule Take 1 capsule (40 mg total) by mouth daily. 11/14/20   Martyn Ehrich, NP  potassium chloride SA (KLOR-CON M20) 20 MEQ tablet Take 1 tablet (20 mEq total) by mouth daily. 09/09/20   Richardson Dopp T, PA-C  rOPINIRole (REQUIP) 1 MG tablet Take 1 tablet (1 mg total) by mouth at bedtime. 08/12/20   [name], MD  terconazole (TERAZOL 7) 0.4 % vaginal cream Place 1 applicator vaginally at bedtime. 11/11/20  [name], MD  Tiotropium Bromide Monohydrate (SPIRIVA  RESPIMAT) 1.25 MCG/ACT AERS Inhale 2 puffs into the lungs daily. 11/14/20   Martyn Ehrich, NP  torsemide (DEMADEX) 20 MG tablet Take 3 tablets (60 mg total) by mouth daily. 11/11/20   [name], MD  VICTOZA 18 MG/3ML SOPN inject 0.6 MG SUBCUTANEOUSLY ONCE A DAY FOR 1 WEEK, THEN INCREASE TO 1.2 MG ONCE daily max 1.8 MG Patient taking differently: Inject 1.2 mg into the skin daily. 04/16/20   [name], MD    Allergies    Cefepime, Kiwi extract, Morphine and related, Trental [pentoxifylline], and Nubain [nalbuphine hcl]  Review of Systems   Review of Systems  Constitutional: Negative for fever.  Gastrointestinal: Negative for nausea.  Musculoskeletal:       See HPI.  Skin: Positive for color change.    Physical Exam Updated Vital Signs BP (!) 113/51   Pulse 83   Temp 97.7 F (36.5 C)   Resp 14   LMP  (LMP Unknown)   SpO2 94%   Physical Exam Constitutional:      Appearance: She is well-developed and well-nourished. She is obese.  Pulmonary:     Effort: Pulmonary effort is normal.  Musculoskeletal:     Cervical back: Normal range of motion.     Comments: Left BKA. Right proximal posterior calf has an area of nonraised erythema. No pustules or blisters. No fluctuance. There is no surrounding induration or redness. No distal or more proximal tenderness.   Skin:    General: Skin is warm and dry.  Neurological:     Mental Status: She is alert and oriented to person, place, and time.     ED Results / Procedures / Treatments   Labs (all labs ordered are listed, but only abnormal results are displayed) Labs Reviewed - No data to display  EKG None  Radiology No results found.  Procedures Procedures (including critical care time)  Medications Ordered in ED Medications - No data to display  ED Course  I have reviewed the triage vital signs and the nursing notes.  Pertinent labs & imaging results that were available during my care of the  patient were reviewed by me and considered in my medical decision making (see chart for details).    MDM Rules/Calculators/A&P                          Patient to ED with concern for spider bite to right calf last evening.   There is an area of redness without definite abscess or ulceration. Focal induration without area of fluctuance concerning for developed abscess.   DDx: insect bite vs early abscess. Will start Abx, strongly encouraged PCP recheck in 2 days.  Final Clinical Impression(s) / ED Diagnoses Final diagnoses:  None   1. Insect bite.  Rx / DC Orders ED Discharge Orders    None       Charlann Lange, PA-C 11/23/20 5726    Merryl Hacker, MD 11/24/20 [phone]

## 2020-11-23 NOTE — ED Triage Notes (Signed)
Pt arrived via [facility] from home for a possible spider bite on her right leg. Pt said she noticed it yesterday and it has caused increased pain today. She is normally on 2 L [place].  BP: 158/70 HR: 86 RR: 20 Temp: 98.7

## 2020-11-23 NOTE — ED Notes (Signed)
Pt's mother was called and she is on the way to pick up the patient.

## 2020-11-24 ENCOUNTER — Other Ambulatory Visit (HOSPITAL_COMMUNITY)
Admission: RE | Admit: 2020-11-24 | Discharge: 2020-11-24 | Disposition: A | Discharge status: Home / Self Care | Payer: Medicaid Other | Source: Ambulatory Visit | Attending: Cardiovascular Disease | Admitting: Cardiovascular Disease

## 2020-11-24 ENCOUNTER — Telehealth: Payer: Self-pay | Admitting: *Deleted

## 2020-11-24 DIAGNOSIS — Z01812 Encounter for preprocedural laboratory examination: Secondary | ICD-10-CM | POA: Insufficient documentation

## 2020-11-24 DIAGNOSIS — Z20822 Contact with and (suspected) exposure to covid-19: Secondary | ICD-10-CM | POA: Insufficient documentation

## 2020-11-24 NOTE — Telephone Encounter (Signed)
Transition Care Management Follow-up Telephone Call  Date of discharge and from where: 11/23/20 - [facility]  How have you been since you were released from the hospital? "I am doing fine."  Any questions or concerns? No  Items Reviewed:  Did the pt receive and understand the discharge instructions provided? Yes   Medications obtained and verified? Yes   Other? No   Any new allergies since your discharge? No   Dietary orders reviewed? Yes  Do you have support at home? Yes   Home Care and Equipment/Supplies: Were home health services ordered? no If so, what is the name of the agency? N/A  Has the agency set up a time to come to the patient's home? not applicable Were any new equipment or medical supplies ordered?  No What is the name of the medical supply agency? N/A Were you able to get the supplies/equipment? not applicable Do you have any questions related to the use of the equipment or supplies? No  Functional Questionnaire: (I = Independent and D = Dependent) ADLs: I  Bathing/Dressing- I  Meal Prep- I  Eating- I  Maintaining continence- I  Transferring/Ambulation- I  Managing Meds- I  Follow up appointments reviewed:   PCP Hospital f/u appt confirmed? Yes  Scheduled to see Dr. [name] on 12/18/20 @ 1010.  [facility] f/u appt confirmed? Yes  Scheduled to see Wound Care on 11/27/20 @ 1100.  Are transportation arrangements needed? No   If their condition worsens, is the pt aware to call PCP or go to the Emergency Dept.? Yes  Was the patient provided with contact information for the PCP's office or ED? Yes  Was to pt encouraged to call back with questions or concerns? Yes

## 2020-11-25 ENCOUNTER — Telehealth: Payer: Self-pay | Admitting: *Deleted

## 2020-11-25 LAB — SARS CORONAVIRUS 2 (TAT 6-24 HRS): SARS Coronavirus 2: NEGATIVE

## 2020-11-25 NOTE — Telephone Encounter (Signed)
Call returned to patient to review procedure instructions, was told patient not in and asked to try patient later today.

## 2020-11-25 NOTE — Progress Notes (Addendum)
CARDIOLOGY OFFICE NOTE  Date:  12/09/2020    [name] Date of Birth: 06-17-1976 Medical Record #[id number]  PCP:  [name], MD  Cardiologist:  Burt Knack   Chief Complaint  Patient presents with  . Follow-up  . Hospitalization Follow-up    Post cath visit - seen for Dr. Burt Knack    History of Present Illness: [name] is a 44 y.o. female who presents today for a follow up visit. Seen for Dr. Burt Knack.   She has a history of uncontrolled diabetes with hyperglycemia, complicated by diabetic ulcers, peripheral arterial disease with history of left BKA and right toe amputations.  She has a history of normal coronary arteries approximately 8 years ago by heart catheterization at that time.  The patient is morbidly obese with poor functional status.  She has had a great deal of difficulty with recurrent chest pain and shortness of breath thought to be secondary to chronic diastolic heart failure.  Her most recent echocardiogram from July 2021 showed an LVEF of 55 to 60% with no regional wall motion abnormalities.  She has had multiple hospitalizations over the last year and multiple emergency room visits for evaluation of chest pain.  During her recent hospitalization she was noted to have minimally elevated troponin at 20 with a follow-up troponin of 17.  She was treated conservatively at that time.  She was seen last month by Dr. Burt Knack - still with chest pain and shortness of breath. She was referred for repeat cardiac cath.    Comes in today. Here alone. This is [name]'s daughter that works here. She came into the visit. She notes left wrist - mainly forearm -  pain since the cath - it improved and then started hurting again. Her labs were quite abnormal. She admits that her eating is poor. She stays thirsty all the time. She likes sugar. She drinks tea and soda. She loves carbs.  She has apparently been referred to bariatric service at [facility] for consideration of surgery. She is  quite immobile. Has healing ulcer on the right foot - she is hopeful to get back to therapy once this is healed. She has not been to see a nutritionist in many years. She says she is taking her medicines.   Past Medical History:  Diagnosis Date  . Acute osteomyelitis of ankle or foot, left ([place]) 01/31/2018  . Alveolar hypoventilation   . Anemia    not on iron pill  . Asthma   . Bipolar 2 disorder ([place])   . Carpal tunnel syndrome on right    recurrent  . Cellulitis 08/2010-08/2011  . Chronic pain   . COPD (chronic obstructive pulmonary disease) ([name])    Symbicort daily and Proventil as needed  . Costochondritis   . Diabetes mellitus type II, uncontrolled ([place]) 2000   Type 2, Uncontrolled.Takes Lantus daily.Fasting blood sugar runs 150  . Drug-seeking behavior   . GERD (gastroesophageal reflux disease)    takes Pantoprazole and Zantac daily  . HLD (hyperlipidemia)    takes Atorvastatin daily  . Hypertension    takes Lisinopril and Coreg daily  . Morbid obesity ([place])   . Nocturia   . OSA on CPAP   . Peripheral neuropathy    takes Gabapentin daily  . Pneumonia    "walking" several yrs ago and as a baby (12/05/2018)  . Rectal fissure   . Restless leg   . SVT (supraventricular tachycardia) ([place])   . Syncope 02/25/2016  .  Urinary frequency   . Urinary incontinence 10/23/2020  . Varicose veins    Right medial thigh and Left leg     Past Surgical History:  Procedure Laterality Date  . AMPUTATION Left 02/01/2018   Procedure: LEFT FOURTH AND 5TH TOE RAY AMPUTATION;  Surgeon: [name], MD;  Location: [place];  Service: Orthopedics;  Laterality: Left;  . AMPUTATION Left 03/03/2018   Procedure: LEFT BELOW KNEE AMPUTATION;  Surgeon: [name], MD;  Location: [place];  Service: Orthopedics;  Laterality: Left;  . CARPAL TUNNEL RELEASE Bilateral   . CESAREAN SECTION  2007  . CORONARY ANGIOGRAPHY N/A 11/26/2020   Procedure: CORONARY ANGIOGRAPHY;  Surgeon: Sherren Mocha, MD;   Location: [place] [facility];  Service: Cardiovascular;  Laterality: N/A;  . INCISION AND DRAINAGE PERIRECTAL ABSCESS Left 05/18/2019   Procedure: IRRIGATION AND DEBRIDEMENT OF PANNIS ABSCESS, POSSIBLE DEBRIDEMENT OF BUTTOCK WOUND;  Surgeon: Donnie Mesa, MD;  Location: [place];  Service: General;  Laterality: Left;  . IRRIGATION AND DEBRIDEMENT BUTTOCKS Left 05/17/2019   Procedure: IRRIGATION AND DEBRIDEMENT BUTTOCKS;  Surgeon: Donnie Mesa, MD;  Location: [place];  Service: General;  Laterality: Left;  . KNEE ARTHROSCOPY Right 07/17/2010  . LEFT HEART CATHETERIZATION WITH CORONARY ANGIOGRAM N/A 07/27/2012   Procedure: LEFT HEART CATHETERIZATION WITH CORONARY ANGIOGRAM;  Surgeon: Sherren Mocha, MD;  Location: [facility] CATH LAB;  Service: Cardiovascular;  Laterality: N/A;  . MASS EXCISION N/A 06/29/2013   Procedure:  WIDE LOCAL EXCISION OF POSTERIOR NECK ABSCESS;  Surgeon: Ralene Ok, MD;  Location: [place];  Service: General;  Laterality: N/A;  . REPAIR KNEE LIGAMENT Left    "fixed ligaments and chipped patella"  . right transmetatarsal amputation        Medications: Current Meds  Medication Sig  . Accu-Chek Softclix Lancets lancets Use to check blood glucose four times daily  . aspirin EC 81 MG tablet Take 1 tablet (81 mg total) by mouth daily. Swallow whole.  Marland Kitchen atorvastatin (LIPITOR) 40 MG tablet Take 1 tablet (40 mg total) by mouth daily.  . budesonide-formoterol (SYMBICORT) 160-4.5 MCG/ACT inhaler Inhale 2 puffs into the lungs 2 (two) times daily.  . Cholecalciferol 1000 units tablet Take 1 tablet (1,000 Units total) by mouth daily.  . clindamycin (CLEOCIN) 150 MG capsule Take 2 capsules (300 mg total) by mouth 3 (three) times daily. May dispense as 150mg  capsules  . Compression Bandages MISC Wear daily for swelling  . Continuous Blood Gluc Sensor (DEXCOM G6 SENSOR) MISC Inject 1 applicator into the skin as directed. Change sensor every 10 days.  . Continuous Blood Gluc Transmit (DEXCOM G6  TRANSMITTER) MISC Inject 1 Device into the skin as directed. Reuse 8 times with sensor changes.  Marland Kitchen doxycycline (VIBRAMYCIN) 100 MG capsule Take 1 capsule (100 mg total) by mouth 2 (two) times daily.  Marland Kitchen gabapentin (NEURONTIN) 600 MG tablet TAKE 2 TABLETS BY MOUTH 3 TIMES DAILY  . glucose blood (ACCU-CHEK GUIDE) test strip USE T0 CHECK BLOOD SUGAR 3 TIMES DAILY  . HYDROcodone-acetaminophen (NORCO) 10-325 MG tablet Take 1 tablet by mouth every 8 (eight) hours as needed for moderate pain.  Marland Kitchen insulin aspart (NOVOLOG) 100 UNIT/ML FlexPen Inject 25 Units into the skin 3 (three) times daily with meals.  . insulin glargine (LANTUS SOLOSTAR) 100 UNIT/ML Solostar Pen Inject 80 Units into the skin 2 (two) times daily.  . Insulin Pen Needle (SURE COMFORT PEN NEEDLES) 32G X 4 MM MISC Use to inject insulin as indicated  .  ipratropium-albuterol (DUONEB) 0.5-2.5 (3) MG/3ML SOLN Take 3 mLs by nebulization every 4 (four) hours as needed.  . Lancets (ACCU-CHEK MULTICLIX) lancets 1 each by Other route as needed for other. Use as instructed  . liraglutide (VICTOZA) 18 MG/3ML SOPN Inject 1.2 mg into the skin daily.  Marland Kitchen loratadine (CLARITIN) 10 MG tablet Take 1 tablet (10 mg total) by mouth daily.  . metoprolol succinate (TOPROL-XL) 25 MG 24 hr tablet Take 25 mg by mouth daily.  . Misc. Devices (TRANSFER BENCH) MISC Use daily  . omeprazole (PRILOSEC) 40 MG capsule Take 1 capsule (40 mg total) by mouth daily.  . potassium chloride SA (KLOR-CON M20) 20 MEQ tablet Take 1 tablet (20 mEq total) by mouth daily.  Marland Kitchen rOPINIRole (REQUIP) 1 MG tablet Take 1 tablet (1 mg total) by mouth at bedtime.  Marland Kitchen terconazole (TERAZOL 7) 0.4 % vaginal cream Place 1 applicator vaginally at bedtime.  . Tiotropium Bromide Monohydrate (SPIRIVA RESPIMAT) 1.25 MCG/ACT AERS Inhale 2 puffs into the lungs daily.  Marland Kitchen torsemide (DEMADEX) 20 MG tablet Take 3 tablets (60 mg total) by mouth daily.     Allergies: Allergies  Allergen Reactions  . Cefepime  Other (See Comments)    AKI, see records from [place] hospitalization in January 2020.    Marland Kitchen Kiwi Extract Shortness Of Breath and Swelling  . Morphine And Related Nausea And Vomiting  . Trental [Pentoxifylline] Nausea And Vomiting  . Nubain [Nalbuphine Hcl] Other (See Comments)    "FEELS LIKE SOMETHING CRAWLING ON ME"    Social History: The patient  reports that she quit smoking about 15 years ago. Her smoking use included cigarettes. She has a 3.75 pack-year smoking history. She has never used smokeless tobacco. She reports previous drug use. She reports that she does not drink alcohol.   Family History: The patient's family history includes Allergic rhinitis in her mother; Anxiety disorder in her sister; Asthma in her child; Cancer in her maternal grandmother; Depression in her mother; Diabetes in her maternal grandmother and mother; GER disease in her mother; Heart attack in her father, paternal grandfather, paternal grandmother, and paternal uncle; Heart disease in her paternal grandfather and paternal grandmother; Hyperlipidemia in her maternal grandmother and mother; Hypertension in her maternal grandmother; Migraines in her sister; Other in her maternal grandfather; Restless legs syndrome in her mother.   Review of Systems: Please see the history of present illness.   All other systems are reviewed and negative.   Physical Exam: VS:  BP 112/70   Pulse 79   Ht 5\' 2"  (1.575 m)   Wt (!) 370 lb (167.8 kg)   SpO2 96%   BMI 67.67 kg/m  .  BMI Body mass index is 67.67 kg/m.  Wt Readings from Last 3 Encounters:  12/09/20 (!) 370 lb (167.8 kg)  12/04/20 (!) 373 lb 0.3 oz (169.2 kg)  11/26/20 (!) 373 lb (169.2 kg)    General: Morbidly obese. She is in a wheelchair. She is in no acute distress.  She did not weigh today - can't stand.  Cardiac: Heart tones are distant. She has a dressing in place on the right foot - left BKA. She has palpable pulse in the left wrist - +1. Her fingers on the  left hand look fine.  Respiratory:  Lungs are clear to auscultation bilaterally with normal work of breathing.  GI: Soft and nontender.  MS: No deformity or atrophy. Gait not tested.  Skin: Warm and dry. Color is normal.  Neuro:  Strength and sensation are intact and no gross focal deficits noted.  Psych: Alert, appropriate and with normal affect.   LABORATORY DATA:  EKG:  EKG is not ordered today.    Lab Results  Component Value Date   WBC 11.5 (H) 12/04/2020   HGB 11.4 (L) 12/04/2020   HCT 35.9 (L) 12/04/2020   PLT 341 12/04/2020   GLUCOSE 643 (HH) 12/04/2020   CHOL 274 (H) 04/10/2020   TRIG 324 (H) 04/10/2020   HDL 39 (L) 04/10/2020   LDLDIRECT 164.9 11/15/2014   LDLCALC 173 (H) 04/10/2020   ALT 18 11/17/2020   AST 12 (L) 11/17/2020   NA 127 (L) 12/04/2020   K 4.4 12/04/2020   CL 88 (L) 12/04/2020   CREATININE 1.24 (H) 12/04/2020   BUN 43 (H) 12/04/2020   CO2 29 12/04/2020   TSH 2.852 06/17/2020   INR 1.05 03/18/2018   HGBA1C >15.5 (H) 10/09/2020   MICROALBUR 17.4 (H) 01/24/2015     BNP (last 3 results) Recent Labs    10/09/20 0254 11/01/20 1720 11/17/20 0944  BNP 41.5 49.1 28.2    ProBNP (last 3 results) Recent Labs    11/14/20 1051  PROBNP 35.0     Other Studies Reviewed Today:  CORONARY ANGIOGRAPHY 11/2020   Conclusion  1.  Mild diffuse proximal LAD stenosis with no evidence of obstructive disease (20 to 30% diffuse stenosis) 2.  Left dominant circumflex with widely patent obtuse marginal branches and left PDA branch, no significant stenoses present 3.  Nondominant RCA with mild diffuse nonobstructive stenosis  Recommend: Ongoing efforts at diabetes control and risk reduction measures.  There is no obstructive CAD to account for the patient's chest discomfort.    ASSESSMENT & PLAN:     1. Chronic non cardiac chest pain - s/p recent cath with stable findings  2. Morbid obesity - this remains the crux of her issues - she seems to have  very poor insight into her issues - I asked her what her goal is - she has a 68 year old child - she plans to see this child grow up - I had a very frank discussion with her - if she does not change her ways, her life will be much shorter. She needs her food addiction/psyche issues with eating to be addressed whether she proceeds with bariatric surgery or not. I think her overall prognosis is very tenuous.   3. Uncontrolled DM   4. Leg pain - for upcoming doppler study  5. Left wrist pain post cath - will check doppler study - I do feel a pulse - only 1+.   6. Marked lab abnormality - will recheck.   Current medicines are reviewed with the patient today.  The patient does not have concerns regarding medicines other than what has been noted above.  The following changes have been made:  See above.  Labs/ tests ordered today include:    Orders Placed This Encounter  Procedures  . VAS [place] UPPER EXTREMITY ARTERIAL DUPLEX     Disposition:   FU with Dr. Burt Knack as planned. BMET today.   Patient is agreeable to this plan and will call if any problems develop in the interim.   SignedTruitt Merle, NP  12/09/2020 3:53 PM  [facility] [address] [place], [place]  [address] Phone: [phone] Fax: [phone]

## 2020-11-25 NOTE — Telephone Encounter (Signed)
Reviewed procedure/mask/visitor instructions, COVID-19 questions with patient. 

## 2020-11-25 NOTE — Telephone Encounter (Signed)
Follow up   Pt is returning call to [place] up while waiting for [place]..   Please call back

## 2020-11-25 NOTE — Telephone Encounter (Addendum)
Pt contacted pre-catheterization scheduled at [facility] for: Wednesday November 26, 2020 8:30 AM Verified arrival time and place: [facility]) at: 6:30 AM   No solid food after midnight prior to cath, clear liquids until 5 AM day of procedure.  Hold: Insulin-AM of procedure/1/2 usual HS Insulin dose prior to procedure Victoza-AM of procedure Torsemide/KCl-AM of procedure  Except hold medications AM meds can be  taken pre-cath with sips of water including: ASA 81 mg   Confirmed patient has responsible adult to drive home post procedure and be with patient first 24 hours after arriving home: yes  You are allowed ONE visitor in the waiting room during the time you are at the hospital for your procedure. Both you and your visitor must wear a mask once you enter the hospital.       COVID-19 Pre-Screening Questions:   In the past 14 days have you had any symptoms concerning for COVID-19 infection (fever, chills, cough, or new shortness of breath)? no  In the past 14 days have you been around anyone with known Covid 19? no         LMTCB for pt to review procedure instructions.

## 2020-11-26 ENCOUNTER — Encounter (HOSPITAL_COMMUNITY): Payer: Self-pay | Admitting: Cardiovascular Disease

## 2020-11-26 ENCOUNTER — Other Ambulatory Visit: Payer: Self-pay

## 2020-11-26 ENCOUNTER — Encounter (HOSPITAL_COMMUNITY)
Admission: RE | Disposition: A | Discharge status: Home / Self Care | Payer: Medicaid Other | Source: Home / Self Care | Attending: Cardiovascular Disease

## 2020-11-26 ENCOUNTER — Ambulatory Visit (HOSPITAL_COMMUNITY)
Admission: RE | Admit: 2020-11-26 | Discharge: 2020-11-26 | Disposition: A | Discharge status: Home / Self Care | Payer: Medicaid Other | Attending: Cardiovascular Disease | Admitting: Cardiovascular Disease

## 2020-11-26 DIAGNOSIS — E1140 Type 2 diabetes mellitus with diabetic neuropathy, unspecified: Secondary | ICD-10-CM | POA: Insufficient documentation

## 2020-11-26 DIAGNOSIS — Z87891 Personal history of nicotine dependence: Secondary | ICD-10-CM | POA: Insufficient documentation

## 2020-11-26 DIAGNOSIS — Z79899 Other long term (current) drug therapy: Secondary | ICD-10-CM | POA: Diagnosis not present

## 2020-11-26 DIAGNOSIS — I209 Angina pectoris, unspecified: Secondary | ICD-10-CM

## 2020-11-26 DIAGNOSIS — Z6841 Body Mass Index (BMI) 40.0 and over, adult: Secondary | ICD-10-CM | POA: Insufficient documentation

## 2020-11-26 DIAGNOSIS — I25119 Atherosclerotic heart disease of native coronary artery with unspecified angina pectoris: Secondary | ICD-10-CM | POA: Diagnosis present

## 2020-11-26 DIAGNOSIS — Z794 Long term (current) use of insulin: Secondary | ICD-10-CM | POA: Diagnosis not present

## 2020-11-26 DIAGNOSIS — I2510 Atherosclerotic heart disease of native coronary artery without angina pectoris: Secondary | ICD-10-CM

## 2020-11-26 HISTORY — DX: Atherosclerotic heart disease of native coronary artery without angina pectoris: I25.10

## 2020-11-26 HISTORY — PX: CORONARY ANGIOGRAPHY: CATH118303

## 2020-11-26 LAB — GLUCOSE, CAPILLARY
Glucose-Capillary: 439 mg/dL — ABNORMAL HIGH (ref 70–99)
Glucose-Capillary: 463 mg/dL — ABNORMAL HIGH (ref 70–99)
Glucose-Capillary: 441 mg/dL — ABNORMAL HIGH (ref 70–99)
Glucose-Capillary: 347 mg/dL — ABNORMAL HIGH (ref 70–99)

## 2020-11-26 LAB — BASIC METABOLIC PANEL
Anion gap: 11 (ref 5–15)
BUN: 44 mg/dL — ABNORMAL HIGH (ref 6–20)
CO2: 28 mmol/L (ref 22–32)
Calcium: 8.6 mg/dL — ABNORMAL LOW (ref 8.9–10.3)
Chloride: 92 mmol/L — ABNORMAL LOW (ref 98–111)
Creatinine, Ser: 1.58 mg/dL — ABNORMAL HIGH (ref 0.44–1.00)
GFR, Estimated: 42 mL/min — ABNORMAL LOW (ref >60)
Glucose, Bld: 498 mg/dL — ABNORMAL HIGH (ref 70–99)
Potassium: 4.3 mmol/L (ref 3.5–5.1)
Sodium: 131 mmol/L — ABNORMAL LOW (ref 135–145)

## 2020-11-26 LAB — HCG, SERUM, QUALITATIVE: Preg, Serum: NEGATIVE

## 2020-11-26 SURGERY — CORONARY ANGIOGRAPHY (CATH LAB)
Anesthesia: LOCAL

## 2020-11-26 MED ORDER — SODIUM CHLORIDE 0.9% FLUSH: 3.0000 mL | Freq: Two times a day (BID) | INTRAVENOUS | Status: DC

## 2020-11-26 MED ORDER — FENTANYL CITRATE (PF) 100 MCG/2ML IJ SOLN: 50.0000 ug | INTRAMUSCULAR | Status: DC | PRN

## 2020-11-26 MED ORDER — HEPARIN (PORCINE) IN NACL 1000-0.9 UT/500ML-% IV SOLN
INTRAVENOUS | Status: DC | PRN
  Administered 2020-11-26 (×2): 500 mL

## 2020-11-26 MED ORDER — SODIUM CHLORIDE 0.9 % IV SOLN: 250.0000 mL | INTRAVENOUS | Status: DC | PRN

## 2020-11-26 MED ORDER — HYDRALAZINE HCL 20 MG/ML IJ SOLN: 10.0000 mg | INTRAMUSCULAR | Status: DC | PRN

## 2020-11-26 MED ORDER — INSULIN ASPART 100 UNIT/ML ~~LOC~~ SOLN
15.0000 [IU] | Freq: Once | SUBCUTANEOUS | Status: AC
  Administered 2020-11-26: 15 [IU] via SUBCUTANEOUS
  Filled 2020-11-26: qty 1

## 2020-11-26 MED ORDER — SODIUM CHLORIDE 0.9% FLUSH: 3.0000 mL | INTRAVENOUS | Status: DC | PRN

## 2020-11-26 MED ORDER — ONDANSETRON HCL 4 MG/2ML IJ SOLN
INTRAMUSCULAR | Status: AC
  Administered 2020-11-26: 4 mg via INTRAVENOUS
  Filled 2020-11-26: qty 2

## 2020-11-26 MED ORDER — ASPIRIN 81 MG PO CHEW: 81.0000 mg | CHEWABLE_TABLET | ORAL | Status: DC

## 2020-11-26 MED ORDER — ONDANSETRON HCL 4 MG/2ML IJ SOLN: 4.0000 mg | Freq: Four times a day (QID) | INTRAMUSCULAR | Status: DC | PRN

## 2020-11-26 MED ORDER — DIAZEPAM 5 MG PO TABS: 5.0000 mg | ORAL_TABLET | ORAL | Status: DC | PRN

## 2020-11-26 MED ORDER — VERAPAMIL HCL 2.5 MG/ML IV SOLN
INTRAVENOUS | Status: AC
  Filled 2020-11-26: qty 2

## 2020-11-26 MED ORDER — SODIUM CHLORIDE 0.9 % WEIGHT BASED INFUSION: 1.0000 mL/kg/h | INTRAVENOUS | Status: DC

## 2020-11-26 MED ORDER — LIDOCAINE HCL (PF) 1 % IJ SOLN
INTRAMUSCULAR | Status: DC | PRN
  Administered 2020-11-26: 2 mL

## 2020-11-26 MED ORDER — SODIUM CHLORIDE 0.9 % IV SOLN: INTRAVENOUS | Status: DC

## 2020-11-26 MED ORDER — ONDANSETRON HCL 4 MG/2ML IJ SOLN: 4.0000 mg | Freq: Once | INTRAMUSCULAR | Status: AC

## 2020-11-26 MED ORDER — HEPARIN (PORCINE) IN NACL 1000-0.9 UT/500ML-% IV SOLN
INTRAVENOUS | Status: AC
  Filled 2020-11-26: qty 500

## 2020-11-26 MED ORDER — HEPARIN SODIUM (PORCINE) 1000 UNIT/ML IJ SOLN
INTRAMUSCULAR | Status: DC | PRN
  Administered 2020-11-26: 6000 [IU] via INTRAVENOUS

## 2020-11-26 MED ORDER — FENTANYL CITRATE (PF) 100 MCG/2ML IJ SOLN
INTRAMUSCULAR | Status: AC
  Filled 2020-11-26: qty 2

## 2020-11-26 MED ORDER — HYDROCODONE-ACETAMINOPHEN 5-325 MG PO TABS
ORAL_TABLET | ORAL | Status: AC
  Administered 2020-11-26: 2
  Filled 2020-11-26: qty 2

## 2020-11-26 MED ORDER — ACETAMINOPHEN 325 MG PO TABS: 650.0000 mg | ORAL_TABLET | ORAL | Status: DC | PRN

## 2020-11-26 MED ORDER — IOHEXOL 350 MG/ML SOLN
INTRAVENOUS | Status: DC | PRN
  Administered 2020-11-26: 80 mL

## 2020-11-26 MED ORDER — MIDAZOLAM HCL 2 MG/2ML IJ SOLN
INTRAMUSCULAR | Status: DC | PRN
  Administered 2020-11-26 (×2): 1 mg via INTRAVENOUS
  Administered 2020-11-26: 2 mg via INTRAVENOUS

## 2020-11-26 MED ORDER — VERAPAMIL HCL 2.5 MG/ML IV SOLN
INTRAVENOUS | Status: DC | PRN
  Administered 2020-11-26: 10 mL via INTRA_ARTERIAL

## 2020-11-26 MED ORDER — HYDROCODONE-ACETAMINOPHEN 10-325 MG PO TABS
1.0000 | ORAL_TABLET | Freq: Once | ORAL | Status: DC
Start: 2020-11-26 — End: 2020-11-26

## 2020-11-26 MED ORDER — INSULIN ASPART 100 UNIT/ML ~~LOC~~ SOLN: 15.0000 [IU] | Freq: Once | SUBCUTANEOUS | Status: AC

## 2020-11-26 MED ORDER — FENTANYL CITRATE (PF) 100 MCG/2ML IJ SOLN
INTRAMUSCULAR | Status: DC | PRN
  Administered 2020-11-26 (×2): 50 ug via INTRAVENOUS
  Administered 2020-11-26: 25 ug via INTRAVENOUS

## 2020-11-26 MED ORDER — LABETALOL HCL 5 MG/ML IV SOLN: 10.0000 mg | INTRAVENOUS | Status: DC | PRN

## 2020-11-26 MED ORDER — INSULIN ASPART 100 UNIT/ML ~~LOC~~ SOLN
SUBCUTANEOUS | Status: AC
  Administered 2020-11-26: 15 [IU] via SUBCUTANEOUS
  Filled 2020-11-26: qty 1

## 2020-11-26 MED ORDER — SODIUM CHLORIDE 0.9 % WEIGHT BASED INFUSION
3.0000 mL/kg/h | INTRAVENOUS | Status: AC
  Administered 2020-11-26: 3 mL/kg/h via INTRAVENOUS

## 2020-11-26 MED ORDER — MIDAZOLAM HCL 2 MG/2ML IJ SOLN
INTRAMUSCULAR | Status: AC
  Filled 2020-11-26: qty 2

## 2020-11-26 SURGICAL SUPPLY — 10 items
CATH INFINITI 5FR MULTPACK ANG (CATHETERS) ×1 IMPLANT
DEVICE RAD COMP TR BAND LRG (VASCULAR PRODUCTS) ×1 IMPLANT
GLIDESHEATH SLEND SS 6F .021 (SHEATH) ×1 IMPLANT
GUIDEWIRE INQWIRE 1.5J.035X260 (WIRE) IMPLANT
INQWIRE 1.5J .035X260CM (WIRE) ×2
KIT HEART LEFT (KITS) ×2 IMPLANT
PACK CARDIAC CATHETERIZATION (CUSTOM PROCEDURE TRAY) ×2 IMPLANT
SHEATH PROBE COVER 6X72 (BAG) ×1 IMPLANT
TRANSDUCER W/STOPCOCK (MISCELLANEOUS) ×2 IMPLANT
TUBING CIL FLEX 10 FLL-RA (TUBING) ×2 IMPLANT

## 2020-11-26 NOTE — Progress Notes (Signed)
Pt now states that she will have to use her left arm to move about. Dr Burt Knack informed states he will go talk with her.

## 2020-11-26 NOTE — Progress Notes (Signed)
Pt c/o nausea, but continues to eat. Instructed pt to stop eating for awhile.

## 2020-11-26 NOTE — Progress Notes (Signed)
CBG is 439. Gwynne Edinger notified. Insulin 15 units novolog given

## 2020-11-26 NOTE — Progress Notes (Signed)
Pt eating at this time. States she feels better

## 2020-11-26 NOTE — Progress Notes (Addendum)
Discharge instructions reviewed with pt and her daughter both voice understanding. Pt husband states she has to use her left arm to get in and out of the car and to move about.Spoke with pt mother she states to ask Erandi if she thinks she will be able to move and get in and out of the car without moving her left arm. Spoke with [name] she states she thinks she can move and get in and out of the car with out moving her left arm.

## 2020-11-26 NOTE — Discharge Instructions (Signed)
Radial Site Care  This sheet gives you information about how to care for yourself after your procedure. Your health care provider may also give you more specific instructions. If you have problems or questions, contact your health care provider. What can I expect after the procedure? After the procedure, it is common to have:  Bruising and tenderness at the catheter insertion area. Follow these instructions at home: Medicines  Take over-the-counter and prescription medicines only as told by your health care provider. Insertion site care 1. Follow instructions from your health care provider about how to take care of your insertion site. Make sure you: ? Wash your hands with soap and water before you change your bandage (dressing). If soap and water are not available, use hand sanitizer. ? Change your dressing as told by your health care provider. 2. Check your insertion site every day for signs of infection. Check for: ? Redness, swelling, or pain. ? Fluid or blood. ? Pus or a bad smell. ? Warmth. 3. Do not take baths, swim, or use a hot tub for 5 days. 4. You may shower 24-48 hours after the procedure. ? Remove the dressing and gently wash the site with plain soap and water. ? Pat the area dry with a clean towel. ? Do not rub the site. That could cause bleeding. 5. Do not apply powder or lotion to the site. Activity  1. For 24 hours after the procedure, or as directed by your health care provider: ? Do not flex or bend the affected arm. ? Do not push or pull heavy objects with the affected arm. ? Do not drive yourself home from the hospital or clinic. You may drive 24 hours after the procedure. ? Do not operate machinery or power tools. ? KEEP ARM ELEVATED THE REMAINDER OF THE DAY. 2. Do not push, pull or lift anything that is heavier than 10 lb for 5 days. 3. Ask your health care provider when it is okay to: ? Return to work or school. ? Resume usual physical activities or  sports. ? Resume sexual activity. General instructions  If the catheter site starts to bleed, raise your arm and put firm pressure on the site. If the bleeding does not stop, get help right away. This is a medical emergency.  DRINK PLENTY OF FLUIDS FOR THE NEXT 2-3 DAYS.  If you went home on the same day as your procedure, a responsible adult should be with you for the first 24 hours after you arrive home.  Keep all follow-up visits as told by your health care provider. This is important. Contact a health care provider if:  You have a fever.  You have redness, swelling, or yellow drainage around your insertion site. Get help right away if:  You have unusual pain at the radial site.  The catheter insertion area swells very fast.  The insertion area is bleeding, and the bleeding does not stop when you hold steady pressure on the area.  Your arm or hand becomes pale, cool, tingly, or numb. These symptoms may represent a serious problem that is an emergency. Do not wait to see if the symptoms will go away. Get medical help right away. Call your local emergency services (911 in the [place] Do not drive yourself to the hospital. Summary  After the procedure, it is common to have bruising and tenderness at the site.  Follow instructions from your health care provider about how to take care of your radial site wound. Check   the wound every day for signs of infection.  Do not push, pull or lift anything that is heavier than 10 lb for 5 days.  This information is not intended to replace advice given to you by your health care provider. Make sure you discuss any questions you have with your health care provider. Document Revised: 12/28/2017 Document Reviewed: 12/28/2017 Elsevier Patient Education  2020 [employer] 

## 2020-11-26 NOTE — Progress Notes (Signed)
Pt vomited large amt of food particles . Encouraged pt to wait a while to eat. States she feels better.  Pt took o2 off. Encouraged to leave o2 on, continues to remove o2.

## 2020-11-26 NOTE — Progress Notes (Signed)
Follow up CBG is 463.  Gwynne Edinger notified.  Novolog 15 units given

## 2020-11-26 NOTE — Progress Notes (Signed)
CBG is 441.  Eliezer Champagne ,RN notified.  Dr Burt Knack aware and  will proceed with procedure.  Pt taken to cath lab

## 2020-11-26 NOTE — Interval H&P Note (Signed)
History and Physical Interval Note:  11/26/2020 9:34 AM  [name]  has presented today for surgery, with the diagnosis of chest pain.  The various methods of treatment have been discussed with the patient and family. After consideration of risks, benefits and other options for treatment, the patient has consented to  Procedure(s): LEFT HEART CATH AND CORONARY ANGIOGRAPHY (N/A) as a surgical intervention.  The patient's history has been reviewed, patient examined, no change in status, stable for surgery.  I have reviewed the patient's chart and labs.  Questions were answered to the patient's satisfaction.     Sherren Mocha

## 2020-11-27 ENCOUNTER — Encounter (HOSPITAL_BASED_OUTPATIENT_CLINIC_OR_DEPARTMENT_OTHER): Payer: Medicaid Other | Admitting: Internal Medicine

## 2020-12-02 ENCOUNTER — Encounter (HOSPITAL_BASED_OUTPATIENT_CLINIC_OR_DEPARTMENT_OTHER): Payer: Medicaid Other | Admitting: Internal Medicine

## 2020-12-02 ENCOUNTER — Other Ambulatory Visit: Payer: Self-pay

## 2020-12-02 DIAGNOSIS — I890 Lymphedema, not elsewhere classified: Secondary | ICD-10-CM | POA: Diagnosis not present

## 2020-12-02 DIAGNOSIS — S31109A Unspecified open wound of abdominal wall, unspecified quadrant without penetration into peritoneal cavity, initial encounter: Secondary | ICD-10-CM | POA: Diagnosis not present

## 2020-12-02 DIAGNOSIS — E11621 Type 2 diabetes mellitus with foot ulcer: Secondary | ICD-10-CM | POA: Diagnosis not present

## 2020-12-02 DIAGNOSIS — E1151 Type 2 diabetes mellitus with diabetic peripheral angiopathy without gangrene: Secondary | ICD-10-CM | POA: Diagnosis not present

## 2020-12-02 DIAGNOSIS — E1140 Type 2 diabetes mellitus with diabetic neuropathy, unspecified: Secondary | ICD-10-CM | POA: Diagnosis not present

## 2020-12-02 DIAGNOSIS — I5030 Unspecified diastolic (congestive) heart failure: Secondary | ICD-10-CM | POA: Diagnosis not present

## 2020-12-02 DIAGNOSIS — L97512 Non-pressure chronic ulcer of other part of right foot with fat layer exposed: Secondary | ICD-10-CM | POA: Diagnosis not present

## 2020-12-02 DIAGNOSIS — I110 Hypertensive heart disease with heart failure: Secondary | ICD-10-CM | POA: Diagnosis not present

## 2020-12-03 NOTE — Progress Notes (Signed)
Belinda, Hall ([id number]) Visit Report for 12/02/2020 Debridement Details Patient Name: Date of Service: [name] [facility] 12/02/2020 8:45 A M Medical Record Number: [id number] Patient Account Number: [id number] Date of Birth/Sex: Treating RN: July 03, 1976 (44 y.o. [name] Primary Care Provider: [name] Other Clinician: Referring Provider: Treating Provider/Extender: Mammie Russian, Ivan Anchors in Treatment: 10 Debridement Performed for Assessment: Wound #4 Right Amputation Site - Transmetatarsal Performed By: Physician Ricard Dillon., MD Debridement Type: Debridement Severity of Tissue Pre Debridement: Fat layer exposed Level of Consciousness (Pre-procedure): Awake and Alert Pre-procedure Verification/Time Out Yes - 09:25 Taken: Start Time: 09:27 Pain Control: Lidocaine 5% topical ointment T Area Debrided (L x W): otal 0.8 (cm) x 0.8 (cm) = 0.64 (cm) Tissue and other material debrided: Viable, Non-Viable, Slough, Subcutaneous, Slough Level: Skin/Subcutaneous Tissue Debridement Description: Excisional Instrument: Curette Bleeding: Minimum Hemostasis Achieved: Pressure End Time: 09:29 Procedural Pain: 0 Post Procedural Pain: 0 Response to Treatment: Procedure was tolerated well Level of Consciousness (Post- Awake and Alert procedure): Post Debridement Measurements of Total Wound Length: (cm) 0.8 Width: (cm) 0.8 Depth: (cm) 0.3 Volume: (cm) 0.151 Character of Wound/Ulcer Post Debridement: Improved Severity of Tissue Post Debridement: Fat layer exposed Post Procedure Diagnosis Same as Pre-procedure Electronic Signature(s) Signed: 12/02/2020 6:35:32 PM By: Baruch Gouty RN, BSN Signed: 12/03/2020 12:07:59 PM By: Linton Ham MD Entered By: Linton Ham on 12/02/2020 09:33:08 -------------------------------------------------------------------------------- HPI Details Patient Name: Date of Service: [name], [name] 12/02/2020 8:45 A  M Medical Record Number: [id number] Patient Account Number: [id number] Date of Birth/Sex: Treating RN: September 23, 1976 (44 y.o. [name] Primary Care Provider: [name] Other Clinician: Referring Provider: Treating Provider/Extender: Mammie Russian, Ivan Anchors in Treatment: 10 History of Present Illness HPI Description: 05/30/19 patient presents today for initial evaluation our clinic concerning significant wounds that she has in the right gluteal region as well as in the left abdominal area. She also has a pressure injury to her trans metatarsal amputation site on the right which does not appear to be as looking at this time but nonetheless does need to close. This occurred while she was in the hospital. The patient was admitted to the hospital on 05/17/19 discharged on 05/25/19. This was due to the significant abscess in the right gluteal region as well as in the left panus region. She has a history of diabetes which is definitely not under good control of the most useful view of an agency being 17.5. She also has hypertension, COPD, and is morbidly obese. Unfortunately the wound and the right gluteal region extends right up and adjacent to the anus which is going to make the Wound VAC impossible at this point. Nonetheless I think that we're gonna have to basically pack this with wet to dry packing at this time in order to hopefully achieve improvement. Nonetheless this is a significant wound with her diabetes be not under control along with her size and again the nature of the wound I think this is gonna take a very long time to heal. 07/04/2019 upon evaluation today patient actually appears to be doing better in regard to her wounds. Her foot ulcer actually appears to be completely healed which is great the abdominal ulcer has filled in quite nicely and seems to be doing well although there is a lot of moisture I think that she would benefit from a silver alginate  dressing. Nonetheless in regard to the wound on her right gluteal region this showed signs of improvement today still  this is a fairly significant ulcer unfortunately. 08/01/2019 on evaluation patient presents for follow-up concerning her wounds over the right gluteal region as well as the left abdominal region. Fortunately both areas are showing signs of improvement which is good news. In fact significant improvement. Spent about a month since have seen her last. Overall I am extremely happy though with the findings of how the wounds have progressed at this point. READMISSION 09/22/2020 This is a now 44 year old woman previously in this clinic for a right gluteal abscess for 3 or 4 visits in 2020. She is a poorly controlled type II diabetic with the last hemoglobin A1c I see in epic at 12.7 in July 2021. She tells [place] that she is here for review of a wound on the tip of her right first metatarsal transmetatarsal amputation. She had an angiogram and angioplasty on 12/20/2018 with angioplasty of her anterior tibial and posterior tibial arteries. She has been followed by Dr. Doran Durand most recent treatment since August I think has been Santyl. She was given a 1 week course of Keflex in mid September. The patient's had an x-ray and also an MRI in August 2021 that did not show osteomyelitis but edema ABI in our clinic was 1.19 Past medical history includes hypertension, hyperlipidemia, COPD, bipolar disorder, poorly controlled type 2 diabetes with PAD. Left BKA, right transmetatarsal amputation, heart failure with preserved ejection fraction, she is minimally I am ambulatory walking only from her wheelchair to the bathroom with her walker. She only wears a stocking on her foot. Otherwise she is in wheelchair or in bed all day long. 11/1; patient readmitted to the clinic 2 weeks ago. She has a wound on the tip of the right first metatarsal transmetatarsal amputation site she has been using Santyl. She has  Medicaid and we have to apply for assistance if we are going to continue with Santyl although I am hopeful that we will be able to change from the [facility] next week. 11/9; area on the tip of the right first metatarsal amputation site. She has been using Santyl. She was in the hospital overnight for chest pain and they gave her another tube of Santyl which is nice since she has Medicaid and Medicaid would not pay for that. Her wound is smaller 12/2; tip of the right first metatarsal amputation site. She has been using Santyl. No changes wound dimension however I think the surface of the wound is better 12/28; patient has not been here in 3 and half weeks I think transportation issues. We gave her Hydrofera Blue last time however she switched back to [place] last week because somebody told her the wound was larger. The wound is actually measuring smaller and looks healthy. Electronic Signature(s) Signed: 12/03/2020 12:07:59 PM By: Linton Ham MD Entered By: Linton Ham on 12/02/2020 09:34:59 -------------------------------------------------------------------------------- Physical Exam Details Patient Name: Date of Service: [name], [name] 12/02/2020 8:45 A M Medical Record Number: [id number] Patient Account Number: [id number] Date of Birth/Sex: Treating RN: Jul 07, 1976 (44 y.o. [name] Primary Care Provider: [name] Other Clinician: Referring Provider: Treating Provider/Extender: Mammie Russian, Ivan Anchors in Treatment: 10 Notes Wound exam; right first MTP TP transmetatarsal amputation site. #3 curette slough and necrotic subcutaneous debris. Hemostasis with direct pressure. Surface cleans up quite nicely no evidence of infection Electronic Signature(s) Signed: 12/03/2020 12:07:59 PM By: Linton Ham MD Entered By: Linton Ham on 12/02/2020 09:35:36 -------------------------------------------------------------------------------- Physician Orders  Details Patient Name: Date of Service: [name], TA [facility] 12/02/2020  8:45 A M Medical Record Number: [id number] Patient Account Number: [id number] Date of Birth/Sex: Treating RN: April 02, 1976 (44 y.o. [name] Primary Care Provider: [name] Other Clinician: Referring Provider: Treating Provider/Extender: Mammie Russian, Ivan Anchors in Treatment: 10 Verbal / Phone Orders: No Diagnosis Coding ICD-10 Coding Code Description E11.621 Type 2 diabetes mellitus with foot ulcer L97.512 Non-pressure chronic ulcer of other part of right foot with fat layer exposed Z89.512 Acquired absence of left leg below knee E11.51 Type 2 diabetes mellitus with diabetic peripheral angiopathy without gangrene Follow-up Appointments Return Appointment in 2 weeks. Bathing/ Shower/ Hygiene May shower with protection but do not get wound dressing(s) wet. - use a cast protector when not changing the dressing. May shower and wash wound with soap and water. - with dressing changes only. Edema Control - Lymphedema / SCD / Other Right Lower Extremity Elevate legs to the level of the heart or above for 30 minutes daily and/or when sitting, a frequency of: - 3-4 times throughout the day. Avoid standing for long periods of time. Moisturize legs daily. Off-Loading Other: - ensure no pressure to foot wound. Wound Treatment Wound #4 - Amputation Site - Transmetatarsal Wound Laterality: Right Cleanser: Normal Saline (Generic) Every Other Day/30 Days Discharge Instructions: Cleanse the wound with Normal Saline prior to applying a clean dressing using gauze sponges, not tissue or cotton balls. Prim Dressing: Hydrofera Blue Ready Foam, 4x5 in Every Other Day/30 Days ary Discharge Instructions: Apply to wound bed as instructed. moisten with saline. Secondary Dressing: Woven Gauze Sponge, Non-Sterile 4x4 in (Generic) Every Other Day/30 Days Discharge Instructions: Apply over primary dressing as  directed. Secured With: [employer], 4.5x3.1 (in/yd) (Generic) Every Other Day/30 Days Discharge Instructions: Secure with Kerlix as directed. Secured With: Paper Tape, 2x10 (in/yd) (Generic) Every Other Day/30 Days Discharge Instructions: Secure dressing with tape as directed. Electronic Signature(s) Signed: 12/02/2020 6:35:32 PM By: Baruch Gouty RN, BSN Signed: 12/03/2020 12:07:59 PM By: Linton Ham MD Entered By: Baruch Gouty on 12/02/2020 09:33:36 -------------------------------------------------------------------------------- Problem List Details Patient Name: Date of Service: [name], Earley [facility] 12/02/2020 8:45 A M Medical Record Number: [id number] Patient Account Number: [id number] Date of Birth/Sex: Treating RN: Feb 18, 1976 (44 y.o. [name] Primary Care Provider: [name] Other Clinician: Referring Provider: Treating Provider/Extender: Mammie Russian, Ivan Anchors in Treatment: 10 Active Problems ICD-10 Encounter Code Description Active Date MDM Diagnosis E11.621 Type 2 diabetes mellitus with foot ulcer 09/22/2020 No Yes L97.512 Non-pressure chronic ulcer of other part of right foot with fat layer exposed 09/22/2020 No Yes Z89.512 Acquired absence of left leg below knee 09/22/2020 No Yes E11.51 Type 2 diabetes mellitus with diabetic peripheral angiopathy without gangrene 09/22/2020 No Yes Inactive Problems Resolved Problems Electronic Signature(s) Signed: 12/03/2020 12:07:59 PM By: Linton Ham MD Entered By: Linton Ham on 12/02/2020 09:31:23 -------------------------------------------------------------------------------- Progress Note Details Patient Name: Date of Service: [name], [name] 12/02/2020 8:45 A M Medical Record Number: [id number] Patient Account Number: [id number] Date of Birth/Sex: Treating RN: 07-22-1976 (44 y.o. [name] Primary Care Provider: [name] Other Clinician: Referring  Provider: Treating Provider/Extender: Mammie Russian, Ivan Anchors in Treatment: 10 Subjective History of Present Illness (HPI) 05/30/19 patient presents today for initial evaluation our clinic concerning significant wounds that she has in the right gluteal region as well as in the left abdominal area. She also has a pressure injury to her trans metatarsal amputation site on the right which does not appear to be as looking at this  time but nonetheless does need to close. This occurred while she was in the hospital. The patient was admitted to the hospital on 05/17/19 discharged on 05/25/19. This was due to the significant abscess in the right gluteal region as well as in the left panus region. She has a history of diabetes which is definitely not under good control of the most useful view of an agency being 17.5. She also has hypertension, COPD, and is morbidly obese. Unfortunately the wound and the right gluteal region extends right up and adjacent to the anus which is going to make the Wound VAC impossible at this point. Nonetheless I think that we're gonna have to basically pack this with wet to dry packing at this time in order to hopefully achieve improvement. Nonetheless this is a significant wound with her diabetes be not under control along with her size and again the nature of the wound I think this is gonna take a very long time to heal. 07/04/2019 upon evaluation today patient actually appears to be doing better in regard to her wounds. Her foot ulcer actually appears to be completely healed which is great the abdominal ulcer has filled in quite nicely and seems to be doing well although there is a lot of moisture I think that she would benefit from a silver alginate dressing. Nonetheless in regard to the wound on her right gluteal region this showed signs of improvement today still this is a fairly significant ulcer unfortunately. 08/01/2019 on evaluation patient presents for  follow-up concerning her wounds over the right gluteal region as well as the left abdominal region. Fortunately both areas are showing signs of improvement which is good news. In fact significant improvement. Spent about a month since have seen her last. Overall I am extremely happy though with the findings of how the wounds have progressed at this point. READMISSION 09/22/2020 This is a now 44 year old woman previously in this clinic for a right gluteal abscess for 3 or 4 visits in 2020. She is a poorly controlled type II diabetic with the last hemoglobin A1c I see in epic at 12.7 in July 2021. She tells [place] that she is here for review of a wound on the tip of her right first metatarsal transmetatarsal amputation. She had an angiogram and angioplasty on 12/20/2018 with angioplasty of her anterior tibial and posterior tibial arteries. She has been followed by Dr. Doran Durand most recent treatment since August I think has been Santyl. She was given a 1 week course of Keflex in mid September. The patient's had an x-ray and also an MRI in August 2021 that did not show osteomyelitis but edema ABI in our clinic was 1.19 Past medical history includes hypertension, hyperlipidemia, COPD, bipolar disorder, poorly controlled type 2 diabetes with PAD. Left BKA, right transmetatarsal amputation, heart failure with preserved ejection fraction, she is minimally I am ambulatory walking only from her wheelchair to the bathroom with her walker. She only wears a stocking on her foot. Otherwise she is in wheelchair or in bed all day long. 11/1; patient readmitted to the clinic 2 weeks ago. She has a wound on the tip of the right first metatarsal transmetatarsal amputation site she has been using Santyl. She has Medicaid and we have to apply for assistance if we are going to continue with Santyl although I am hopeful that we will be able to change from the [facility] next week. 11/9; area on the tip of the right first metatarsal  amputation site. She has  been using Santyl. She was in the hospital overnight for chest pain and they gave her another tube of Santyl which is nice since she has Medicaid and Medicaid would not pay for that. Her wound is smaller 12/2; tip of the right first metatarsal amputation site. She has been using Santyl. No changes wound dimension however I think the surface of the wound is better 12/28; patient has not been here in 3 and half weeks I think transportation issues. We gave her Hydrofera Blue last time however she switched back to [place] last week because somebody told her the wound was larger. The wound is actually measuring smaller and looks healthy. Objective Constitutional Vitals Time Taken: 8:58 AM, Height: 62 in, Weight: 362 lbs, BMI: 66.2, Temperature: 98 F, Pulse: 99 bpm, Respiratory Rate: 22 breaths/min, Blood Pressure: 147/80 mmHg, Capillary Blood Glucose: 150 mg/dl. Integumentary (Hair, Skin) Wound #4 status is Open. Original cause of wound was Gradually Appeared. 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 The wound measures 0.8cm length x 0.8cm width x 0.3cm depth; 0.503cm^2 area and 0.151cm^3 volume. There is Fat Layer (Subcutaneous Tissue) exposed. There is no tunneling or undermining noted. There is a medium amount of serosanguineous drainage noted. The wound margin is distinct with the outline attached to the wound base. There is large (67-100%) red granulation within the wound bed. There is a small (1-33%) amount of necrotic tissue within the wound bed including Adherent Slough. 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 There was a Excisional Skin/Subcutaneous Tissue Debridement with a total area of 0.64 sq cm performed by Maxwell Caul., MD. With the following instrument(s): Curette to remove Viable and Non-Viable tissue/material. Material removed includes Subcutaneous Tissue and Slough and after achieving pain control using Lidocaine 5% topical ointment. No specimens were taken. A time out was conducted at 09:25, prior to the start of the procedure. A Minimum amount of bleeding was controlled with Pressure. The procedure was tolerated well with a pain level of 0 throughout and a pain level of 0 following the procedure. Post Debridement Measurements: 0.8cm length x 0.8cm width x 0.3cm depth; 0.151cm^3 volume. Character of Wound/Ulcer Post Debridement is improved. Severity of Tissue Post Debridement is: Fat layer exposed. Post procedure Diagnosis Wound #4: Same as Pre-Procedure Plan Follow-up Appointments: Return Appointment in 2 weeks. Bathing/ Shower/ Hygiene: May shower with protection but do not get wound dressing(s) wet. - use a cast protector when not changing the dressing. May shower and wash wound with soap and water. - with dressing changes only. Edema Control - Lymphedema / SCD / Other: Elevate legs to the level of the heart or above for 30 minutes daily and/or when sitting, a frequency of: - 3-4 times throughout the day. Avoid standing for long periods of time. Moisturize legs daily. Off-Loading: Other: - ensure no pressure to foot wound. WOUND #4: - Amputation Site - Transmetatarsal Wound Laterality: Right Cleanser: Normal Saline (Generic) Every Other Day/30 Days Discharge Instructions: Cleanse the wound with Normal Saline prior to applying a clean dressing using gauze sponges, not tissue or cotton balls. Prim  Dressing: Hydrofera Blue Ready Foam, 4x5 in Every Other Day/30  Days ary Discharge Instructions: Apply to wound bed as instructed. moisten with saline. Secondary Dressing: Woven Gauze Sponge, Non-Sterile 4x4 in (Generic) Every Other Day/30 Days Discharge Instructions: Apply over primary dressing as directed. Secured With: [employer], 4.5x3.1 (in/yd) (Generic) Every Other Day/30 Days Discharge Instructions: Secure with Kerlix as directed. Secured With: Paper T ape, 2x10 (in/yd) (Generic) Every Other Day/30 Days Discharge Instructions: Secure dressing with tape as directed. #1 I asked her to switch back to [facility]. The wound is smaller surface looks healthier. 2. There is no suggestion she is putting pressure on this she only walks for short distances or transfers, wears socks this is not a weightbearing surface Electronic Signature(s) Signed: 12/03/2020 12:07:59 PM By: Linton Ham MD Entered By: Linton Ham on 12/02/2020 09:36:30 -------------------------------------------------------------------------------- SuperBill Details Patient Name: Date of Service: [name], TA [facility] 12/02/2020 Medical Record Number: [id number] Patient Account Number: [id number] Date of Birth/Sex: Treating RN: [birth year] (44 y.o. Martyn Malay, Linda Primary Care Provider: [name] Other Clinician: Referring Provider: Treating Provider/Extender: Mammie Russian, Ivan Anchors in Treatment: 10 Diagnosis Coding ICD-10 Codes Code Description E11.621 Type 2 diabetes mellitus with foot ulcer L97.512 Non-pressure chronic ulcer of other part of right foot with fat layer exposed Z89.512 Acquired absence of left leg below knee E11.51 Type 2 diabetes mellitus with diabetic peripheral angiopathy without gangrene Facility Procedures CPT4 Code: [id number] Description: [id number] - DEB SUBQ TISSUE 20 SQ CM/< ICD-10 Diagnosis Description L97.512 Non-pressure chronic ulcer of other part of right foot with fat layer exposed Modifier: Quantity: 1 Physician  Procedures : CPT4 Code Description Modifier [id number] - WC PHYS SUBQ TISS 20 SQ CM ICD-10 Diagnosis Description L97.512 Non-pressure chronic ulcer of other part of right foot with fat layer exposed Quantity: 1 Electronic Signature(s) Signed: 12/03/2020 12:07:59 PM By: Linton Ham MD Entered By: Linton Ham on 12/02/2020 09:36:41

## 2020-12-03 NOTE — Progress Notes (Signed)
[name] ([id number]) Visit Report for 12/02/2020 Arrival Information Details Patient Name: Date of Service: [name] [facility] 12/02/2020 8:45 A M Medical Record Number: [id number] Patient Account Number: [id number] Date of Birth/Sex: Treating RN: 02-11-1976 (44 y.o. [name] Primary Care Latonya Nelon: [name] Other Clinician: Referring Elizabeht Suto: Treating [name]/Extender: Mammie Russian, Ivan Anchors in Treatment: 10 Visit Information History Since Last Visit Added or deleted any medications: No Patient Arrived: Wheel Chair Any new allergies or adverse reactions: No Arrival Time: 08:58 Had a fall or experienced change in No Accompanied By: self activities of daily living that may affect Transfer Assistance: None risk of falls: Patient Identification Verified: Yes Signs or symptoms of abuse/neglect since last visito No Secondary Verification Process Completed: Yes Hospitalized since last visit: No Patient Requires Transmission-Based Precautions: No Implantable device outside of the clinic excluding No Patient Has Alerts: No cellular tissue based products placed in the center since last visit: Has Dressing in Place as Prescribed: No Pain Present Now: No Electronic Signature(s) Signed: 12/02/2020 6:26:47 PM By: Deon Pilling Entered By: Deon Pilling on 12/02/2020 09:05:14 -------------------------------------------------------------------------------- Encounter Discharge Information Details Patient Name: Date of Service: [name], TA [facility] 12/02/2020 8:45 A M Medical Record Number: [id number] Patient Account Number: [id number] Date of Birth/Sex: Treating RN: Aug 24, 1976 (44 y.o. [name] Primary Care Aliviyah Malanga: [name] Other Clinician: Referring Delmy Holdren: Treating [name]/Extender: Mammie Russian, Ivan Anchors in Treatment: 10 Encounter Discharge Information Items Post Procedure Vitals Discharge Condition:  Stable Temperature (F): 98 Ambulatory Status: Wheelchair Pulse (bpm): 99 Discharge Destination: Home Respiratory Rate (breaths/min): 18 Transportation: Private Auto Blood Pressure (mmHg): 147/80 Accompanied By: friend Schedule Follow-up Appointment: Yes Clinical Summary of Care: Patient Declined Electronic Signature(s) Signed: 12/02/2020 6:35:32 PM By: Baruch Gouty RN, BSN Entered By: Baruch Gouty on 12/02/2020 09:35:54 -------------------------------------------------------------------------------- Lower Extremity Assessment Details Patient Name: Date of Service: [name] [facility] 12/02/2020 8:45 A M Medical Record Number: [id number] Patient Account Number: [id number] Date of Birth/Sex: Treating RN: 1975/12/09 (44 y.o. [name] Primary Care Hyrum Shaneyfelt: [name] Other Clinician: Referring Darnette Lampron: Treating [name]/Extender: Mammie Russian, Ivan Anchors in Treatment: 10 Edema Assessment Assessed: Shirlyn Goltz: No] [Right: Yes] Edema: [Left: Ye] [Right: s] Calf Left: Right: Point of Measurement: From Medial Instep 45 cm Ankle Left: Right: Point of Measurement: From Medial Instep 24 cm Vascular Assessment Pulses: Dorsalis Pedis Palpable: [Right:Yes] Electronic Signature(s) Signed: 12/02/2020 6:26:47 PM By: Deon Pilling Entered By: Deon Pilling on 12/02/2020 09:07:23 -------------------------------------------------------------------------------- Multi Wound Chart Details Patient Name: Date of Service: [name], [name] 12/02/2020 8:45 A M Medical Record Number: [id number] Patient Account Number: [id number] Date of Birth/Sex: Treating RN: 1975-12-11 (44 y.o. [name] Primary Care Aidaly Cordner: [name] Other Clinician: Referring Banesa Tristan: Treating Asja Frommer/Extender: Mammie Russian, Ivan Anchors in Treatment: 10 Vital Signs Height(in): 62 Capillary Blood Glucose(mg/dl): 150 Weight(lbs): 362 Pulse(bpm): 24 Body  Mass Index(BMI): 74 Blood Pressure(mmHg): 147/80 Temperature(F): 98 Respiratory Rate(breaths/min): 22 Photos: [4:No Photos Right Amputation Site -] [N/A:N/A N/A] Wound Location: [4:Transmetatarsal Gradually Appeared] [N/A:N/A] Wounding Event: [4:Diabetic Wound/Ulcer of the Lower] [N/A:N/A] Primary Etiology: [4:Extremity Asthma, Chronic Obstructive] [N/A:N/A] Comorbid History: [4:Pulmonary Disease (COPD), Sleep Apnea, Congestive Heart Failure, Hypertension, Peripheral Venous Disease, Type II Diabetes, Osteoarthritis, Osteomyelitis, Neuropathy 03/06/2020] [N/A:N/A] Date Acquired: [4:10] [N/A:N/A] Weeks of Treatment: [4:Open] [N/A:N/A] Wound Status: [4:0.8x0.8x0.3] [N/A:N/A] Measurements L x W x D (cm) [4:0.503] [N/A:N/A] A (cm) : rea [4:0.151] [N/A:N/A] Volume (cm) : [4:41.80%] [N/A:N/A] % Reduction in A [4:rea: 41.70%] [N/A:N/A] % Reduction in Volume: [  4:Grade 2] [N/A:N/A] Classification: [4:Medium] [N/A:N/A] Exudate A mount: [4:Serosanguineous] [N/A:N/A] Exudate Type: [4:red, brown] [N/A:N/A] Exudate Color: [4:Distinct, outline attached] [N/A:N/A] Wound Margin: [4:Large (67-100%)] [N/A:N/A] Granulation A mount: [4:Red] [N/A:N/A] Granulation Quality: [4:Small (1-33%)] [N/A:N/A] Necrotic A mount: [4:Fat Layer (Subcutaneous Tissue): Yes N/A] Exposed Structures: [4:Fascia: No Tendon: No Muscle: No Joint: No Bone: No Medium (34-66%)] [N/A:N/A] Epithelialization: [4:Debridement - Excisional] [N/A:N/A] Debridement: Pre-procedure Verification/Time Out 09:25 [N/A:N/A] Taken: [4:Lidocaine 5% topical ointment] [N/A:N/A] Pain Control: [4:Subcutaneous, Slough] [N/A:N/A] Tissue Debrided: [4:Skin/Subcutaneous Tissue] [N/A:N/A] Level: [4:0.64] [N/A:N/A] Debridement A (sq cm): [4:rea Curette] [N/A:N/A] Instrument: [4:Minimum] [N/A:N/A] Bleeding: [4:Pressure] [N/A:N/A] Hemostasis A chieved: [4:0] [N/A:N/A] Procedural Pain: [4:0] [N/A:N/A] Post Procedural Pain: [4:Procedure was tolerated  well] [N/A:N/A] Debridement Treatment Response: [4:0.8x0.8x0.3] [N/A:N/A] Post Debridement Measurements L x W x D (cm) [4:0.151] [N/A:N/A] Post Debridement Volume: (cm) [4:Debridement] [N/A:N/A] Treatment Notes Electronic Signature(s) Signed: 12/02/2020 6:35:32 PM By: Baruch Gouty RN, BSN Signed: 12/03/2020 12:07:59 PM By: Linton Ham MD Entered By: Linton Ham on 12/02/2020 09:31:29 -------------------------------------------------------------------------------- Multi-Disciplinary Care Plan Details Patient Name: Date of Service: [name], Earley Favor [facility] 12/02/2020 8:45 A M Medical Record Number: [id number] Patient Account Number: [id number] Date of Birth/Sex: Treating RN: June 06, 1976 (44 y.o. [name] Primary Care Kebron Pulse: [name] Other Clinician: Referring Krystopher Kuenzel: Treating Marilin Kofman/Extender: Mammie Russian, Ivan Anchors in Treatment: 10 Active Inactive Abuse / Safety / Falls / Self Care Management Nursing Diagnoses: History of Falls Potential for injury related to falls Goals: Patient will not experience any injury related to falls Date Initiated: 09/22/2020 Target Resolution Date: 01/01/2021 Goal Status: Active Patient/caregiver will verbalize/demonstrate measures taken to prevent injury and/or falls Date Initiated: 09/22/2020 Target Resolution Date: 12/24/2020 Goal Status: Active Interventions: Assess Activities of Daily Living upon admission and as needed Assess fall risk on admission and as needed Assess: immobility, friction, shearing, incontinence upon admission and as needed Assess impairment of mobility on admission and as needed per policy Assess personal safety and home safety (as indicated) on admission and as needed Assess self care needs on admission and as needed Provide education on fall prevention Notes: Nutrition Nursing Diagnoses: Impaired glucose control: actual or potential Potential for alteratiion in  Nutrition/Potential for imbalanced nutrition Goals: Patient/caregiver agrees to and verbalizes understanding of need to use nutritional supplements and/or vitamins as prescribed Date Initiated: 09/22/2020 Date Inactivated: 11/06/2020 Target Resolution Date: 11/06/2020 Goal Status: Met Patient/caregiver will maintain therapeutic glucose control Date Initiated: 09/22/2020 Target Resolution Date: 12/30/2020 Goal Status: Active Interventions: Assess HgA1c results as ordered upon admission and as needed Assess patient nutrition upon admission and as needed per policy Provide education on elevated blood sugars and impact on wound healing Provide education on nutrition Treatment Activities: Education provided on Nutrition : 10/06/2020 Notes: Electronic Signature(s) Signed: 12/02/2020 6:35:32 PM By: Baruch Gouty RN, BSN Entered By: Baruch Gouty on 12/02/2020 09:27:37 -------------------------------------------------------------------------------- Pain Assessment Details Patient Name: Date of Service: [name], [name] 12/02/2020 8:45 A M Medical Record Number: [id number] Patient Account Number: [id number] Date of Birth/Sex: Treating RN: [birth year] (44 y.o. [name] Primary Care Otillia Cordone: [name] Other Clinician: Referring Tene Gato: Treating Frans Valente/Extender: Mammie Russian, Ivan Anchors in Treatment: 10 Active Problems Location of Pain Severity and Description of Pain Patient Has Paino No Site Locations Rate the pain. Rate the pain. Current Pain Level: 0 Pain Management and Medication Current Pain Management: Medication: No Cold Application: No Rest: No Massage: No Activity: No T.E.N.S.: No Heat Application: No Leg drop or elevation: No Is the Current Pain Management Adequate:  Adequate How does your wound impact your activities of daily livingo Sleep: No Bathing: No Appetite: No Relationship With Others: No Bladder Continence:  No Emotions: No Bowel Continence: No Work: No Toileting: No Drive: No Dressing: No Hobbies: No Electronic Signature(s) Signed: 12/02/2020 6:26:47 PM By: Deon Pilling Entered By: Deon Pilling on 12/02/2020 09:05:59 -------------------------------------------------------------------------------- Patient/Caregiver Education Details Patient Name: Date of Service: [name], [name] 12/28/2021andnbsp8:45 A M Medical Record Number: [id number] Patient Account Number: [id number] Date of Birth/Gender: Treating RN: 1976/02/01 (44 y.o. [name] Primary Care Physician: [name] Other Clinician: Referring Physician: Treating Physician/Extender: Mammie Russian, Ivan Anchors in Treatment: 10 Education Assessment Education Provided To: Patient Education Topics Provided Elevated Blood Sugar/ Impact on Healing: Methods: Explain/Verbal Responses: Reinforcements needed, State content correctly Wound/Skin Impairment: Methods: Explain/Verbal Responses: Reinforcements needed, State content correctly Electronic Signature(s) Signed: 12/02/2020 6:35:32 PM By: Baruch Gouty RN, BSN Signed: 12/02/2020 6:35:32 PM By: Baruch Gouty RN, BSN Entered By: Baruch Gouty on 12/02/2020 09:27:56 -------------------------------------------------------------------------------- Wound Assessment Details Patient Name: Date of Service: [name], [name] 12/02/2020 8:45 A M Medical Record Number: [id number] Patient Account Number: [id number] Date of Birth/Sex: Treating RN: 02/27/1976 (43 y.o. [name], [name] Primary Care Nalah Macioce: [name] Other Clinician: Referring Syriana Croslin: Treating Laquashia Mergenthaler/Extender: Mammie Russian, Ivan Anchors in Treatment: 10 Wound Status Wound Number: 4 Primary Diabetic Wound/Ulcer of the Lower Extremity Etiology: Wound Location: Right Amputation Site - Transmetatarsal Wound Open Wounding Event: Gradually Appeared Status: Date  Acquired: 03/06/2020 Comorbid Asthma, Chronic Obstructive Pulmonary Disease (COPD), Sleep Weeks Of Treatment: 10 History: Apnea, Congestive Heart Failure, Hypertension, Peripheral Venous Clustered Wound: No Disease, Type II Diabetes, Osteoarthritis, Osteomyelitis, Neuropathy Wound Measurements Length: (cm) 0.8 Width: (cm) 0.8 Depth: (cm) 0.3 Area: (cm) 0.503 Volume: (cm) 0.151 % Reduction in Area: 41.8% % Reduction in Volume: 41.7% Epithelialization: Medium (34-66%) Tunneling: No Undermining: No Wound Description Classification: Grade 2 Wound Margin: Distinct, outline attached Exudate Amount: Medium Exudate Type: Serosanguineous Exudate Color: red, brown Foul Odor After Cleansing: No Slough/Fibrino Yes Wound Bed Granulation Amount: Large (67-100%) Exposed Structure Granulation Quality: Red Fascia Exposed: No Necrotic Amount: Small (1-33%) Fat Layer (Subcutaneous Tissue) Exposed: Yes Necrotic Quality: Adherent Slough Tendon Exposed: No Muscle Exposed: No Joint Exposed: No Bone Exposed: No Treatment Notes Wound #4 (Amputation Site - Transmetatarsal) Wound Laterality: Right Cleanser Normal Saline Discharge Instruction: Cleanse the wound with Normal Saline prior to applying a clean dressing using gauze sponges, not tissue or cotton balls. Peri-Wound Care Topical Primary Dressing Hydrofera Blue Ready Foam, 4x5 in Discharge Instruction: Apply to wound bed as instructed. moisten with saline. Secondary Dressing Woven Gauze Sponge, Non-Sterile 4x4 in Discharge Instruction: Apply over primary dressing as directed. Secured With [employer], 4.5x3.1 (in/yd) Discharge Instruction: Secure with Kerlix as directed. Paper Tape, 2x10 (in/yd) Discharge Instruction: Secure dressing with tape as directed. Compression Wrap Compression Stockings Add-Ons Electronic Signature(s) Signed: 12/02/2020 6:26:47 PM By: Deon Pilling Entered By: Deon Pilling on 12/02/2020  09:07:52 -------------------------------------------------------------------------------- Vitals Details Patient Name: Date of Service: [name], [name] 12/02/2020 8:45 A M Medical Record Number: [id number] Patient Account Number: [id number] Date of Birth/Sex: Treating RN: [birth year] (44 y.o. [name], [name] Primary Care Trip Cavanagh: [name] Other Clinician: Referring Kenner Lewan: Treating Jenia Klepper/Extender: Mammie Russian, Ivan Anchors in Treatment: 10 Vital Signs Time Taken: 08:58 Temperature (F): 98 Height (in): 62 Pulse (bpm): 99 Weight (lbs): 362 Respiratory Rate (breaths/min): 22 Body Mass Index (BMI): 66.2 Blood Pressure (mmHg): 147/80 Capillary Blood Glucose (mg/dl): 150 Reference Range:  80 - 120 mg / dl Electronic Signature(s) Signed: 12/02/2020 6:26:47 PM By: Deon Pilling Entered By: Deon Pilling on 12/02/2020 09:05:28

## 2020-12-04 ENCOUNTER — Ambulatory Visit (INDEPENDENT_AMBULATORY_CARE_PROVIDER_SITE_OTHER): Payer: Medicaid Other | Admitting: Family Medicine

## 2020-12-04 ENCOUNTER — Encounter: Payer: Self-pay | Admitting: Family Medicine

## 2020-12-04 ENCOUNTER — Other Ambulatory Visit: Payer: Self-pay

## 2020-12-04 ENCOUNTER — Emergency Department (HOSPITAL_COMMUNITY)
Admission: EM | Admit: 2020-12-04 | Discharge: 2020-12-04 | Disposition: A | Discharge status: Home / Self Care | Payer: Medicaid Other | Attending: Emergency Medicine | Admitting: Emergency Medicine

## 2020-12-04 ENCOUNTER — Emergency Department (HOSPITAL_COMMUNITY): Payer: Medicaid Other

## 2020-12-04 VITALS — BP 102/62 | HR 77

## 2020-12-04 DIAGNOSIS — J449 Chronic obstructive pulmonary disease, unspecified: Secondary | ICD-10-CM | POA: Diagnosis not present

## 2020-12-04 DIAGNOSIS — Z7982 Long term (current) use of aspirin: Secondary | ICD-10-CM | POA: Diagnosis not present

## 2020-12-04 DIAGNOSIS — J45909 Unspecified asthma, uncomplicated: Secondary | ICD-10-CM | POA: Diagnosis not present

## 2020-12-04 DIAGNOSIS — R600 Localized edema: Secondary | ICD-10-CM | POA: Diagnosis not present

## 2020-12-04 DIAGNOSIS — Z79899 Other long term (current) drug therapy: Secondary | ICD-10-CM | POA: Diagnosis not present

## 2020-12-04 DIAGNOSIS — L089 Local infection of the skin and subcutaneous tissue, unspecified: Secondary | ICD-10-CM | POA: Diagnosis present

## 2020-12-04 DIAGNOSIS — R609 Edema, unspecified: Secondary | ICD-10-CM | POA: Diagnosis not present

## 2020-12-04 DIAGNOSIS — Z7951 Long term (current) use of inhaled steroids: Secondary | ICD-10-CM | POA: Diagnosis not present

## 2020-12-04 DIAGNOSIS — I25119 Atherosclerotic heart disease of native coronary artery with unspecified angina pectoris: Secondary | ICD-10-CM | POA: Diagnosis not present

## 2020-12-04 DIAGNOSIS — R509 Fever, unspecified: Secondary | ICD-10-CM | POA: Diagnosis not present

## 2020-12-04 DIAGNOSIS — E1140 Type 2 diabetes mellitus with diabetic neuropathy, unspecified: Secondary | ICD-10-CM | POA: Diagnosis not present

## 2020-12-04 DIAGNOSIS — L03115 Cellulitis of right lower limb: Secondary | ICD-10-CM | POA: Diagnosis not present

## 2020-12-04 DIAGNOSIS — M7989 Other specified soft tissue disorders: Secondary | ICD-10-CM | POA: Diagnosis not present

## 2020-12-04 DIAGNOSIS — T63304D Toxic effect of unspecified spider venom, undetermined, subsequent encounter: Secondary | ICD-10-CM | POA: Insufficient documentation

## 2020-12-04 DIAGNOSIS — R52 Pain, unspecified: Secondary | ICD-10-CM | POA: Diagnosis not present

## 2020-12-04 DIAGNOSIS — I10 Essential (primary) hypertension: Secondary | ICD-10-CM | POA: Diagnosis not present

## 2020-12-04 DIAGNOSIS — Z794 Long term (current) use of insulin: Secondary | ICD-10-CM | POA: Insufficient documentation

## 2020-12-04 DIAGNOSIS — Z87891 Personal history of nicotine dependence: Secondary | ICD-10-CM | POA: Diagnosis not present

## 2020-12-04 LAB — CBG MONITORING, ED
Glucose-Capillary: 585 mg/dL (ref 70–99)
Glucose-Capillary: 497 mg/dL — ABNORMAL HIGH (ref 70–99)

## 2020-12-04 LAB — CBC WITH DIFFERENTIAL/PLATELET
Abs Immature Granulocytes: 0.17 10*3/uL — ABNORMAL HIGH (ref 0.00–0.07)
Basophils Absolute: 0.1 10*3/uL (ref 0.0–0.1)
Basophils Relative: 1 %
Eosinophils Absolute: 0.3 10*3/uL (ref 0.0–0.5)
Eosinophils Relative: 2 %
HCT: 35.9 % — ABNORMAL LOW (ref 36.0–46.0)
Hemoglobin: 11.4 g/dL — ABNORMAL LOW (ref 12.0–15.0)
Immature Granulocytes: 2 %
Lymphocytes Relative: 39 %
Lymphs Abs: 4.5 10*3/uL — ABNORMAL HIGH (ref 0.7–4.0)
MCH: 29.2 pg (ref 26.0–34.0)
MCHC: 31.8 g/dL (ref 30.0–36.0)
MCV: 92.1 fL (ref 80.0–100.0)
Monocytes Absolute: 0.8 10*3/uL (ref 0.1–1.0)
Monocytes Relative: 7 %
Neutro Abs: 5.7 10*3/uL (ref 1.7–7.7)
Neutrophils Relative %: 49 %
Platelets: 341 10*3/uL (ref 150–400)
RBC: 3.9 MIL/uL (ref 3.87–5.11)
RDW: 13.2 % (ref 11.5–15.5)
WBC: 11.5 10*3/uL — ABNORMAL HIGH (ref 4.0–10.5)
nRBC: 0 % (ref 0.0–0.2)

## 2020-12-04 LAB — BASIC METABOLIC PANEL
Anion gap: 10 (ref 5–15)
BUN: 43 mg/dL — ABNORMAL HIGH (ref 6–20)
CO2: 29 mmol/L (ref 22–32)
Calcium: 8.6 mg/dL — ABNORMAL LOW (ref 8.9–10.3)
Chloride: 88 mmol/L — ABNORMAL LOW (ref 98–111)
Creatinine, Ser: 1.24 mg/dL — ABNORMAL HIGH (ref 0.44–1.00)
GFR, Estimated: 56 mL/min — ABNORMAL LOW (ref >60)
Glucose, Bld: 643 mg/dL (ref 70–99)
Potassium: 4.4 mmol/L (ref 3.5–5.1)
Sodium: 127 mmol/L — ABNORMAL LOW (ref 135–145)

## 2020-12-04 LAB — LACTIC ACID, PLASMA
Lactic Acid, Venous: 2.5 mmol/L (ref 0.5–1.9)
Lactic Acid, Venous: 2.1 mmol/L (ref 0.5–1.9)

## 2020-12-04 MED ORDER — FENTANYL CITRATE (PF) 100 MCG/2ML IJ SOLN
50.0000 ug | Freq: Once | INTRAMUSCULAR | Status: AC
  Administered 2020-12-04: 50 ug via INTRAVENOUS
  Filled 2020-12-04: qty 2

## 2020-12-04 MED ORDER — INSULIN ASPART 100 UNIT/ML ~~LOC~~ SOLN
14.0000 [IU] | Freq: Once | SUBCUTANEOUS | Status: AC
  Administered 2020-12-04: 14 [IU] via INTRAVENOUS

## 2020-12-04 MED ORDER — CLINDAMYCIN PHOSPHATE 600 MG/50ML IV SOLN
600.0000 mg | Freq: Once | INTRAVENOUS | Status: AC
  Administered 2020-12-04: 600 mg via INTRAVENOUS
  Filled 2020-12-04: qty 50

## 2020-12-04 MED ORDER — SODIUM CHLORIDE 0.9 % IV BOLUS
1000.0000 mL | Freq: Once | INTRAVENOUS | Status: AC
  Administered 2020-12-04: 1000 mL via INTRAVENOUS

## 2020-12-04 MED ORDER — CLINDAMYCIN HCL 150 MG PO CAPS: 300.0000 mg | ORAL_CAPSULE | Freq: Three times a day (TID) | ORAL | 0 refills | Status: DC

## 2020-12-04 MED ORDER — ONDANSETRON HCL 4 MG/2ML IJ SOLN
4.0000 mg | Freq: Once | INTRAMUSCULAR | Status: AC
  Administered 2020-12-04: 4 mg via INTRAVENOUS
  Filled 2020-12-04: qty 2

## 2020-12-04 NOTE — Discharge Instructions (Signed)
You were given IV clindamycin here in the ED, you will need to continue this at home.  I have sent this to your pharmacy. Follow-up with wound care about your leg. I would also follow-up with your primary care doctor. Return here for new concerns.

## 2020-12-04 NOTE — ED Notes (Signed)
Date and time results received: 12/04/20 0350 (use smartphrase ".now" to insert current time)  Test: Lactic Acid; Glucose Critical Value: Lactic Acid - 2.5; Glucose 643 Name of Provider Notified: Sharilyn Sites

## 2020-12-04 NOTE — ED Triage Notes (Signed)
Pt bib gems c/o of wound infection on right calf. Pt previously seen at [facility] on 12/19 for the same problem and was prescribed doxycycline, which she has now finished. VSS. On 2L O2 at baseline.

## 2020-12-04 NOTE — ED Notes (Signed)
Called [facility] to transport patient home. Pt is 2nd holding on the list for [facility]

## 2020-12-04 NOTE — ED Notes (Signed)
Patient transported to X-ray 

## 2020-12-04 NOTE — Patient Instructions (Signed)
We decided together today to give your antibiotics more time. Take the clindamycin as it's prescribed. If ANY worsening at all, please go back to the ED.  This includes increased pain, swelling, fever, chills, not able to eat/drink, or generally feeling worse.  Follow up with me on Tuesday as scheduled.  Be well, Dr. Pollie Meyer

## 2020-12-04 NOTE — ED Provider Notes (Signed)
[place] EMERGENCY DEPARTMENT Provider Note   CSN: [id number] Arrival date & time: 12/04/20  0112     History Chief Complaint  Patient presents with  . Wound Infection    [name] is a 44 y.o. female.  The history is provided by the patient and medical records.   44 y.o. F with hx of anemia, uncontrolled DM, morbid obesity, chronic pain, COPD, drug seeking behavior, GERD, HLP, presenting to the ED for wound to right calf.  Patient states she was bitten by a spider (she thinks) on 11/22/20.  Seen at [facility] ED 11/23/20 and prescribed doxycycline which she finished yesterday.  States initially she thought it was getting better but got acutely worse today with increased pain/swelling.  She denies drainage from the area.  No fever/chills.  She is currently seeing wound care s/p transmetatarsal amputation of right foot and actually had appointment yesterday but did not mention this to them during that visit.  States blood sugar has been all over the place at home but that is not that unusual for her.  Past Medical History:  Diagnosis Date  . Acute osteomyelitis of ankle or foot, left ([place]) 01/31/2018  . Alveolar hypoventilation   . Anemia    not on iron pill  . Asthma   . Bipolar 2 disorder ([place])   . Carpal tunnel syndrome on right    recurrent  . Cellulitis 08/2010-08/2011  . Chronic pain   . COPD (chronic obstructive pulmonary disease) ([name])    Symbicort daily and Proventil as needed  . Costochondritis   . Diabetes mellitus type II, uncontrolled ([place]) 2000   Type 2, Uncontrolled.Takes Lantus daily.Fasting blood sugar runs 150  . Drug-seeking behavior   . GERD (gastroesophageal reflux disease)    takes Pantoprazole and Zantac daily  . HLD (hyperlipidemia)    takes Atorvastatin daily  . Hypertension    takes Lisinopril and Coreg daily  . Morbid obesity ([place])   . Nocturia   . OSA on CPAP   . Peripheral neuropathy    takes Gabapentin daily  . Pneumonia     "walking" several yrs ago and as a baby (12/05/2018)  . Rectal fissure   . Restless leg   . SVT (supraventricular tachycardia) ([place])   . Syncope 02/25/2016  . Urinary frequency   . Urinary incontinence 10/23/2020  . Varicose veins    Right medial thigh and Left leg     Patient Active Problem List   Diagnosis Date Noted  . Urinary incontinence 10/23/2020  . Elevated troponin   . Acute respiratory failure with hypoxia and hypercarbia ([place]) 06/15/2020  . Chronic respiratory failure with hypoxia ([place]) 06/14/2020  . Blister of foot 04/21/2020  . Exposure to mold 04/21/2020  . Constipation 04/03/2020  . Left shoulder pain 06/23/2019  . Hyperglycemia due to diabetes mellitus ([place])   . 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[place]) 04/11/2019  . Allergic rhinitis 03/06/2018  . Class 3 severe obesity due to excess calories with serious comorbidity and body mass index (BMI) of 50.0 to 59.9 in adult [facility])   . Decreased pedal pulses 06/10/2017  . Unilateral primary osteoarthritis, right knee 10/22/2016  . Primary osteoarthritis of first carpometacarpal joint of left hand 07/30/2016  . Diabetic neuropathy ([place]) 07/14/2016  . Nausea 03/17/2016  . De Quervain's tenosynovitis, bilateral 11/01/2015  . Vitamin D deficiency 09/05/2015  . Recurrent candidiasis of vagina 09/05/2015  . Varicose veins of leg with complications 99991111  . Restless leg syndrome 10/17/2014  .  Chronic sinusitis 07/18/2014  . Encounter for chronic pain management 06/30/2013  . HLD (hyperlipidemia) 11/19/2012  . Angina pectoris ([place]) 06/27/2012  . Abscess of skin and subcutaneous tissue 11/04/2011  . Right carpal tunnel syndrome 09/01/2011  . Bilateral knee pain 09/01/2011  . DM (diabetes mellitus) type II uncontrolled, periph vascular disorder ([place]) 05/22/2008  . Morbid obesity ([place]) 05/22/2008  . Obesity hypoventilation syndrome ([place]) 05/22/2008  . Mood disorder ([place]) 05/22/2008  . Obstructive sleep apnea 05/22/2008  .  Hypertension 05/22/2008  . Asthma 05/22/2008  . GERD 05/22/2008    Past Surgical History:  Procedure Laterality Date  . AMPUTATION Left 02/01/2018   Procedure: LEFT FOURTH AND 5TH TOE RAY AMPUTATION;  Surgeon: [name], MD;  Location: [place];  Service: Orthopedics;  Laterality: Left;  . AMPUTATION Left 03/03/2018   Procedure: LEFT BELOW KNEE AMPUTATION;  Surgeon: [name], MD;  Location: [place];  Service: Orthopedics;  Laterality: Left;  . CARPAL TUNNEL RELEASE Bilateral   . CESAREAN SECTION  2007  . CORONARY ANGIOGRAPHY N/A 11/26/2020   Procedure: CORONARY ANGIOGRAPHY;  Surgeon: Sherren Mocha, MD;  Location: [place] CV LAB;  Service: Cardiovascular;  Laterality: N/A;  . INCISION AND DRAINAGE PERIRECTAL ABSCESS Left 05/18/2019   Procedure: IRRIGATION AND DEBRIDEMENT OF PANNIS ABSCESS, POSSIBLE DEBRIDEMENT OF BUTTOCK WOUND;  Surgeon: Donnie Mesa, MD;  Location: [place];  Service: General;  Laterality: Left;  . IRRIGATION AND DEBRIDEMENT BUTTOCKS Left 05/17/2019   Procedure: IRRIGATION AND DEBRIDEMENT BUTTOCKS;  Surgeon: Donnie Mesa, MD;  Location: [place];  Service: General;  Laterality: Left;  . KNEE ARTHROSCOPY Right 07/17/2010  . LEFT HEART CATHETERIZATION WITH CORONARY ANGIOGRAM N/A 07/27/2012   Procedure: LEFT HEART CATHETERIZATION WITH CORONARY ANGIOGRAM;  Surgeon: Sherren Mocha, MD;  Location: [facility] CATH LAB;  Service: Cardiovascular;  Laterality: N/A;  . MASS EXCISION N/A 06/29/2013   Procedure:  WIDE LOCAL EXCISION OF POSTERIOR NECK ABSCESS;  Surgeon: Ralene Ok, MD;  Location: [place];  Service: General;  Laterality: N/A;  . REPAIR KNEE LIGAMENT Left    "fixed ligaments and chipped patella"  . right transmetatarsal amputation        OB History    Gravida  2   Para  1   Term      Preterm      AB  1   Living  1     SAB  1   IAB      Ectopic      Multiple      Live Births              Family History  Problem Relation Age of Onset  .  Diabetes Mother   . Hyperlipidemia Mother   . Depression Mother   . GER disease Mother   . Allergic rhinitis Mother   . Restless legs syndrome Mother   . Heart attack Paternal Uncle   . Heart disease Paternal Grandmother   . Heart attack Paternal Grandmother   . Heart attack Paternal Grandfather   . Heart disease Paternal Grandfather   . Heart attack Father   . Migraines Sister   . Cancer Maternal Grandmother        COLON  . Hypertension Maternal Grandmother   . Hyperlipidemia Maternal Grandmother   . Diabetes Maternal Grandmother   . Other Maternal Grandfather        GUN SHOT  . Anxiety disorder Sister   . Asthma Child     Social History  Tobacco Use  . Smoking status: Former Smoker    Packs/day: 0.25    Years: 15.00    Pack years: 3.75    Types: Cigarettes    Quit date: 12/06/2005    Years since quitting: 15.0  . Smokeless tobacco: Never Used  . Tobacco comment: smokes for a couple of months  Vaping Use  . Vaping Use: Never used  Substance Use Topics  . Alcohol use: No    Alcohol/week: 0.0 standard drinks  . Drug use: Not Currently    Comment: OD attempts on home meds      Home Medications Prior to Admission medications   Medication Sig Start Date End Date Taking? Authorizing Provider  Accu-Chek Softclix Lancets lancets Use to check blood glucose four times daily 04/17/20   Westley Chandler, MD  aspirin EC 81 MG tablet Take 1 tablet (81 mg total) by mouth daily. Swallow whole. 11/19/20   Tonny Bollman, MD  atorvastatin (LIPITOR) 40 MG tablet Take 1 tablet (40 mg total) by mouth daily. 04/11/20   Latrelle Dodrill, MD  budesonide-formoterol [facility]) 160-4.5 MCG/ACT inhaler Inhale 2 puffs into the lungs 2 (two) times daily. 05/09/20   Latrelle Dodrill, MD  Cholecalciferol 1000 units tablet Take 1 tablet (1,000 Units total) by mouth daily. 04/05/17   Latrelle Dodrill, MD  Compression Bandages MISC Wear daily for swelling 07/10/20   Latrelle Dodrill, MD   Continuous Blood Gluc Sensor (DEXCOM G6 SENSOR) MISC Inject 1 applicator into the skin as directed. Change sensor every 10 days. 10/16/20   Kathrin Ruddy, RPH-CPP  Continuous Blood Gluc Transmit (DEXCOM G6 TRANSMITTER) MISC Inject 1 Device into the skin as directed. Reuse 8 times with sensor changes. 10/16/20   Kathrin Ruddy, RPH-CPP  doxycycline (VIBRAMYCIN) 100 MG capsule Take 1 capsule (100 mg total) by mouth 2 (two) times daily. 11/23/20   Elpidio Anis, PA-C  gabapentin (NEURONTIN) 600 MG tablet TAKE 2 TABLETS BY MOUTH 3 TIMES DAILY Patient taking differently: Take 1,200 mg by mouth 3 (three) times daily. 10/20/20   Latrelle Dodrill, MD  glucose blood (ACCU-CHEK GUIDE) test strip USE T0 CHECK BLOOD SUGAR 3 TIMES DAILY 04/18/20   Kathrin Ruddy, RPH-CPP  HYDROcodone-acetaminophen (NORCO) 10-325 MG tablet Take 1 tablet by mouth every 8 (eight) hours as needed for moderate pain. 11/11/20   Latrelle Dodrill, MD  insulin aspart (NOVOLOG) 100 UNIT/ML FlexPen Inject 25 Units into the skin 3 (three) times daily with meals. 11/11/20   Latrelle Dodrill, MD  insulin glargine (LANTUS SOLOSTAR) 100 UNIT/ML Solostar Pen Inject 80 Units into the skin 2 (two) times daily. 11/11/20   Latrelle Dodrill, MD  Insulin Pen Needle (SURE COMFORT PEN NEEDLES) 32G X 4 MM MISC Use to inject insulin as indicated 04/17/20   Westley Chandler, MD  ipratropium-albuterol (DUONEB) 0.5-2.5 (3) MG/3ML SOLN Take 3 mLs by nebulization every 4 (four) hours as needed. Patient taking differently: Take 3 mLs by nebulization every 4 (four) hours as needed (shortness of breath). 06/25/20   Latrelle Dodrill, MD  Lancets (ACCU-CHEK MULTICLIX) lancets 1 each by Other route as needed for other. Use as instructed 04/18/20   Kathrin Ruddy, RPH-CPP  loratadine (CLARITIN) 10 MG tablet Take 1 tablet (10 mg total) by mouth daily. 11/14/20   Glenford Bayley, NP  metoprolol succinate (TOPROL-XL) 25 MG 24 hr tablet Take 25 mg by  mouth daily. 10/23/20   [provider]  Misc.  Devices (TRANSFER BENCH) MISC Use daily 10/29/20   [name], MD  omeprazole (PRILOSEC) 40 MG capsule Take 1 capsule (40 mg total) by mouth daily. 11/14/20   Martyn Ehrich, NP  potassium chloride SA (KLOR-CON M20) 20 MEQ tablet Take 1 tablet (20 mEq total) by mouth daily. 09/09/20   Richardson Dopp T, PA-C  rOPINIRole (REQUIP) 1 MG tablet Take 1 tablet (1 mg total) by mouth at bedtime. 08/12/20   [name], MD  terconazole (TERAZOL 7) 0.4 % vaginal cream Place 1 applicator vaginally at bedtime. 11/11/20   [name], MD  Tiotropium Bromide Monohydrate (SPIRIVA RESPIMAT) 1.25 MCG/ACT AERS Inhale 2 puffs into the lungs daily. 11/14/20   Martyn Ehrich, NP  torsemide (DEMADEX) 20 MG tablet Take 3 tablets (60 mg total) by mouth daily. 11/11/20   [name], MD  VICTOZA 18 MG/3ML SOPN inject 0.6 MG SUBCUTANEOUSLY ONCE A DAY FOR 1 WEEK, THEN INCREASE TO 1.2 MG ONCE daily max 1.8 MG Patient taking differently: Inject 1.2 mg into the skin daily. 04/16/20   [name], MD    Allergies    Cefepime, Kiwi extract, Morphine and related, Trental [pentoxifylline], and Nubain [nalbuphine hcl]  Review of Systems   Review of Systems  Skin: Positive for wound.  All other systems reviewed and are negative.   Physical Exam Updated Vital Signs BP (!) 141/70   Pulse 85   Temp 97.7 F (36.5 C) (Oral)   Resp 20   Ht 5\' 2"  (1.575 m)   Wt (!) 169.2 kg   SpO2 99%   BMI 68.23 kg/m   Physical Exam Vitals and nursing note reviewed.  Constitutional:      Appearance: She is well-developed and well-nourished. She is morbidly obese.  HENT:     Head: Normocephalic and atraumatic.     Mouth/Throat:     Mouth: Oropharynx is clear and moist.  Eyes:     Extraocular Movements: EOM normal.     Conjunctiva/sclera: Conjunctivae normal.     Pupils: Pupils are equal, round, and reactive to light.   Cardiovascular:     Rate and Rhythm: Normal rate and regular rhythm.     Heart sounds: Normal heart sounds.  Pulmonary:     Effort: Pulmonary effort is normal.     Breath sounds: Normal breath sounds.  Abdominal:     General: Bowel sounds are normal.     Palpations: Abdomen is soft.  Musculoskeletal:        General: Normal range of motion.     Cervical back: Normal range of motion.     Comments: Right posterior calf with large scabbed area along mid calf, there is very mild surrounding cellulitis/erythema, mildly warm to touch, no fluctuance or active drainage Partial right foot amputation, bandaged  Skin:    General: Skin is warm and dry.  Neurological:     Mental Status: She is alert and oriented to person, place, and time.  Psychiatric:        Mood and Affect: Mood and affect normal.     ED Results / Procedures / Treatments   Labs (all labs ordered are listed, but only abnormal results are displayed) Labs Reviewed  CBC WITH DIFFERENTIAL/PLATELET - Abnormal; Notable for the following components:      Result Value   WBC 11.5 (*)    Hemoglobin 11.4 (*)    HCT 35.9 (*)    Lymphs Abs 4.5 (*)    Abs  Immature Granulocytes 0.17 (*)    All other components within normal limits  BASIC METABOLIC PANEL - Abnormal; Notable for the following components:   Sodium 127 (*)    Chloride 88 (*)    Glucose, Bld 643 (*)    BUN 43 (*)    Creatinine, Ser 1.24 (*)    Calcium 8.6 (*)    GFR, Estimated 56 (*)    All other components within normal limits  LACTIC ACID, PLASMA - Abnormal; Notable for the following components:   Lactic Acid, Venous 2.5 (*)    All other components within normal limits  LACTIC ACID, PLASMA - Abnormal; Notable for the following components:   Lactic Acid, Venous 2.1 (*)    All other components within normal limits  CBG MONITORING, ED - Abnormal; Notable for the following components:   Glucose-Capillary 581 (*)    All other components within normal limits  CBG  MONITORING, ED - Abnormal; Notable for the following components:   Glucose-Capillary 585 (*)    All other components within normal limits  CBG MONITORING, ED - Abnormal; Notable for the following components:   Glucose-Capillary 497 (*)    All other components within normal limits    EKG None  Radiology DG Tibia/Fibula Right  Result Date: 12/04/2020 CLINICAL DATA:  Cellulitis EXAM: RIGHT TIBIA AND FIBULA - 2 VIEW COMPARISON:  None. FINDINGS: There is a cortical step-off seen at the distal fibular tip. There is diffuse subcutaneous edema and superficial calcifications seen surrounding the lower extremity. Dense vascular calcifications are noted. IMPRESSION: Cortical step-off seen at the distal fibular tip which could represent an age indeterminate fracture. Diffuse soft tissue swelling seen surrounding the lower extremity. Electronically Signed   By: Prudencio Pair M.D.   On: 12/04/2020 02:13    Procedures Procedures (including critical care time)  Medications Ordered in ED Medications  sodium chloride 0.9 % bolus 1,000 mL (1,000 mLs Intravenous New Bag/Given 12/04/20 0404)  insulin aspart (novoLOG) injection 14 Units (14 Units Intravenous Given 12/04/20 0425)  clindamycin (CLEOCIN) IVPB 600 mg (0 mg Intravenous Stopped 12/04/20 0506)  fentaNYL (SUBLIMAZE) injection 50 mcg (50 mcg Intravenous Given 12/04/20 0504)  ondansetron (ZOFRAN) injection 4 mg (4 mg Intravenous Given 12/04/20 0534)    ED Course  I have reviewed the triage vital signs and the nursing notes.  Pertinent labs & imaging results that were available during my care of the patient were reviewed by me and considered in my medical decision making (see chart for details).    MDM Rules/Calculators/A&P  44 year old female, morbidly obese, here with concern of infection to the right calf.  Recently treated with doxycycline following spider bite to right posterior calf.  Seen at wound care center yesterday for separate issue,  did not have them evaluate her leg.  She is afebrile and nontoxic in appearance here.  She does have scabbed area to right posterior calf with minimal surrounding erythema/induration.  There is no streaking of the leg.  No tissue crepitus or signs of necrosis.  She does have history of poorly controlled diabetes, states sugars have been "all over the place" at home.  CBG here 585.  Screening labs sent.  Started IV fluids.  Labs here with mild leukocytosis.  Glucose is 643 but normal bicarb at 29 and anion gap of 10.  Clinically not DKA.  Lactic acid is 2.5 but suspect this is likely from her hyperglycemic state rather than sepsis/infection.  IVF infusing.  Given dose of insulin and  IV clindamycin.  Will plan to repeat lactic acid after IVF and if improved will d/c home with PO clindamycin and wound care follow-up.  Lactate improved to 2.1.  CBG also improving (likely will continue to trend down over the next few hours).  Continue to feel this does not represent sepsis.  Remains afebrile, non-toxic.  As she has reasonable OP follow-up and quite mild symptoms on exam, will d/c home with clindamycin and close follow-up.  She may return here for any new/acute changes.  Shared visit with attending physician, Dr. Laverta Baltimore, who agrees with plan of care.  Final Clinical Impression(s) / ED Diagnoses Final diagnoses:  Spider bite wound, undetermined intent, subsequent encounter    Rx / DC Orders ED Discharge Orders         Ordered    clindamycin (CLEOCIN) 150 MG capsule  3 times daily        12/04/20 0617           Larene Pickett, PA-C 12/04/20 [id number]    Margette Fast, MD 12/09/20 [phone]

## 2020-12-04 NOTE — Progress Notes (Signed)
  Date of Visit: 12/04/2020   SUBJECTIVE:   HPI:  [name] presents today for a same day appointment to discuss spider bite on leg.  Initially seen at [facility] ED on 12/19 and given course of doxycycline. Improved initially but then worsened. At some point it formed a head and began to drain but has stopped draining.  Was just seen in [facility] ED at 1am today for this and was discharged, but called here for an appointment as she thought it might need to be drained. In ED was given fluids and insulin as sugars were high, also given IV clindamycin and an rx for clindamycin orally.   Patient systemically says she feels well. No fevers or chills. Eating and drinking normally. Leg is painful.  OBJECTIVE:   BP 102/62   Pulse 77   SpO2 94%  Gen: no acute distress, pleasant, cooperative HEENT: normocephalic, atraumatic  Heart: regular rate and rhythm  Lungs: normal work of breathing  Neuro: alert, speech normal, grossly nonfocal Ext: R calf with approximately 1.5 cm area of skin breakdown/sloughing and some dark eschar-like material with surrounding erythema. Area tender to palpation. No drainage with gentle manipulation. No fluctuance appreciated. See photo below. Note: lighter/whiter area on L side of dark area is loosened skin, not an appreciable pocket of pus as it may appear in the photo.      ASSESSMENT/PLAN:   Cellulitis of R calf Systemically well on the whole. Blood pressure is mildly low but patient denies any dizziness or lightheadedness, and has had similar low blood pressures on occasion in the past. She is not tachycardic or febrile and overall appears well. I doubt sepsis (as did the ED provider overnight).   Reviewed options for treatment with patient today. I cannot feel a discernable abscess on exam (no fluctuance of obvious pockets of pus) but offered to incise it to see if anything drains. After discussion, patient elected to give clindamycin rx time to see if it  will improve things, as she prefers to avoid incision and drainage if possible. Reviewed strict return precautions, advised having a very low threshold to go to ED if any worsening. Visit scheduled on Monday AM to recheck. Patient agreeable to this plan.  [place] J. Pollie Meyer, MD [facility]

## 2020-12-06 DIAGNOSIS — E669 Obesity, unspecified: Secondary | ICD-10-CM | POA: Diagnosis not present

## 2020-12-06 DIAGNOSIS — M86172 Other acute osteomyelitis, left ankle and foot: Secondary | ICD-10-CM | POA: Diagnosis not present

## 2020-12-06 DIAGNOSIS — R32 Unspecified urinary incontinence: Secondary | ICD-10-CM | POA: Diagnosis not present

## 2020-12-06 DIAGNOSIS — J45909 Unspecified asthma, uncomplicated: Secondary | ICD-10-CM | POA: Diagnosis not present

## 2020-12-06 DIAGNOSIS — G4733 Obstructive sleep apnea (adult) (pediatric): Secondary | ICD-10-CM | POA: Diagnosis not present

## 2020-12-08 ENCOUNTER — Ambulatory Visit: Payer: Medicaid Other | Admitting: Family Medicine

## 2020-12-08 LAB — CBG MONITORING, ED: Glucose-Capillary: 581 mg/dL (ref 70–99)

## 2020-12-09 ENCOUNTER — Other Ambulatory Visit: Payer: Self-pay

## 2020-12-09 ENCOUNTER — Ambulatory Visit: Payer: BLUE CROSS/BLUE SHIELD | Admitting: Family Medicine

## 2020-12-09 ENCOUNTER — Encounter: Payer: Self-pay | Admitting: Nurse Practitioner

## 2020-12-09 ENCOUNTER — Ambulatory Visit (INDEPENDENT_AMBULATORY_CARE_PROVIDER_SITE_OTHER): Payer: Medicaid Other | Admitting: Nurse Practitioner

## 2020-12-09 VITALS — BP 112/70 | HR 79 | Ht 62.0 in | Wt 370.0 lb

## 2020-12-09 DIAGNOSIS — R0789 Other chest pain: Secondary | ICD-10-CM

## 2020-12-09 DIAGNOSIS — I10 Essential (primary) hypertension: Secondary | ICD-10-CM

## 2020-12-09 DIAGNOSIS — E1142 Type 2 diabetes mellitus with diabetic polyneuropathy: Secondary | ICD-10-CM

## 2020-12-09 DIAGNOSIS — Z9889 Other specified postprocedural states: Secondary | ICD-10-CM | POA: Diagnosis not present

## 2020-12-09 NOTE — Patient Instructions (Addendum)
After Visit Summary:  We will be checking the following labs today - NONE   Medication Instructions:    Continue with your current medicines.    If you need a refill on your cardiac medications before your next appointment, please call your pharmacy.     Testing/Procedures To Be Arranged:  N/A  Follow-Up:   See Dr. Excell Seltzer as planned.     At [facility], you and your health needs are our priority.  As part of our continuing mission to provide you with exceptional heart care, we have created designated Provider Care Teams.  These Care Teams include your primary Cardiologist (physician) and Advanced Practice Providers (APPs -  Physician Assistants and Nurse Practitioners) who all work together to provide you with the care you need, when you need it.  Special Instructions:  . Stay safe, wash your hands for at least 20 seconds and wear a mask when needed.  . It was good to talk with you today. . Think about what we talked about today.      Call the [facility] office at [phone] if you have any questions, problems or concerns.

## 2020-12-10 ENCOUNTER — Telehealth: Payer: Self-pay | Admitting: Cardiovascular Disease

## 2020-12-10 ENCOUNTER — Ambulatory Visit (HOSPITAL_COMMUNITY)
Admission: RE | Admit: 2020-12-10 | Discharge: 2020-12-10 | Disposition: A | Discharge status: Home / Self Care | Payer: Medicaid Other | Source: Ambulatory Visit | Attending: Cardiology | Admitting: Cardiology

## 2020-12-10 ENCOUNTER — Telehealth: Payer: Self-pay

## 2020-12-10 DIAGNOSIS — M79632 Pain in left forearm: Secondary | ICD-10-CM | POA: Diagnosis not present

## 2020-12-10 DIAGNOSIS — Z9889 Other specified postprocedural states: Secondary | ICD-10-CM | POA: Insufficient documentation

## 2020-12-10 LAB — BASIC METABOLIC PANEL
BUN/Creatinine Ratio: 36 — ABNORMAL HIGH (ref 9–23)
BUN: 37 mg/dL — ABNORMAL HIGH (ref 6–24)
CO2: 29 mmol/L (ref 20–29)
Calcium: 9.4 mg/dL (ref 8.7–10.2)
Chloride: 85 mmol/L — ABNORMAL LOW (ref 96–106)
Creatinine, Ser: 1.03 mg/dL — ABNORMAL HIGH (ref 0.57–1.00)
GFR calc Af Amer: 77 mL/min/{1.73_m2} (ref >59)
GFR calc non Af Amer: 67 mL/min/{1.73_m2} (ref >59)
Glucose: 543 mg/dL (ref 65–99)
Potassium: 4.4 mmol/L (ref 3.5–5.2)
Sodium: 128 mmol/L — ABNORMAL LOW (ref 134–144)

## 2020-12-10 NOTE — Telephone Encounter (Signed)
Received call from [facility] regarding critical results from 12/09/20 BMP.  Critical Glucose of 543 Of note-12/04/20 patient had another critical glucose of 643 Will forward to provider, LG for advisement if needed.

## 2020-12-10 NOTE — Telephone Encounter (Signed)
Patient called and gave verbal permission to give information regarding her care to [name] (her mother). She will complete a DPR form and return it to be scanned to the chart.

## 2020-12-10 NOTE — Telephone Encounter (Signed)
[name] with [facility] is calling to report critical labs.  Phone #: [phone] (option 1)

## 2020-12-10 NOTE — Telephone Encounter (Signed)
Spoke with Clydie Braun from [place]. She is calling to report glucose of 543 on lab work from 12/10/19.  This was addressed earlier today.  See lab results and phone call from earlier today

## 2020-12-10 NOTE — Telephone Encounter (Signed)
Already noted.   Lawson Fiscal

## 2020-12-10 NOTE — Telephone Encounter (Signed)
Patience from [facility] is calling with a critical lab report. She hung up before I could transfer.

## 2020-12-11 ENCOUNTER — Ambulatory Visit: Payer: Medicaid Other | Admitting: Physician Assistant

## 2020-12-16 ENCOUNTER — Ambulatory Visit (HOSPITAL_COMMUNITY)
Admission: RE | Admit: 2020-12-16 | Payer: Medicaid Other | Source: Ambulatory Visit | Attending: Cardiovascular Disease | Admitting: Cardiovascular Disease

## 2020-12-18 ENCOUNTER — Other Ambulatory Visit: Payer: Self-pay

## 2020-12-18 ENCOUNTER — Encounter: Payer: Self-pay | Admitting: Family Medicine

## 2020-12-18 ENCOUNTER — Ambulatory Visit (INDEPENDENT_AMBULATORY_CARE_PROVIDER_SITE_OTHER): Payer: Medicaid Other | Admitting: Family Medicine

## 2020-12-18 ENCOUNTER — Encounter (HOSPITAL_BASED_OUTPATIENT_CLINIC_OR_DEPARTMENT_OTHER): Payer: Medicaid Other | Admitting: Internal Medicine

## 2020-12-18 VITALS — BP 110/70 | HR 87

## 2020-12-18 DIAGNOSIS — E11622 Type 2 diabetes mellitus with other skin ulcer: Secondary | ICD-10-CM | POA: Insufficient documentation

## 2020-12-18 DIAGNOSIS — L97218 Non-pressure chronic ulcer of right calf with other specified severity: Secondary | ICD-10-CM | POA: Insufficient documentation

## 2020-12-18 DIAGNOSIS — L97212 Non-pressure chronic ulcer of right calf with fat layer exposed: Secondary | ICD-10-CM | POA: Diagnosis not present

## 2020-12-18 DIAGNOSIS — E1165 Type 2 diabetes mellitus with hyperglycemia: Secondary | ICD-10-CM | POA: Diagnosis not present

## 2020-12-18 DIAGNOSIS — E11621 Type 2 diabetes mellitus with foot ulcer: Secondary | ICD-10-CM | POA: Diagnosis not present

## 2020-12-18 DIAGNOSIS — IMO0002 Reserved for concepts with insufficient information to code with codable children: Secondary | ICD-10-CM

## 2020-12-18 DIAGNOSIS — G8929 Other chronic pain: Secondary | ICD-10-CM

## 2020-12-18 DIAGNOSIS — L97512 Non-pressure chronic ulcer of other part of right foot with fat layer exposed: Secondary | ICD-10-CM | POA: Insufficient documentation

## 2020-12-18 DIAGNOSIS — Z23 Encounter for immunization: Secondary | ICD-10-CM | POA: Diagnosis not present

## 2020-12-18 DIAGNOSIS — E1151 Type 2 diabetes mellitus with diabetic peripheral angiopathy without gangrene: Secondary | ICD-10-CM

## 2020-12-18 DIAGNOSIS — L97919 Non-pressure chronic ulcer of unspecified part of right lower leg with unspecified severity: Secondary | ICD-10-CM | POA: Diagnosis present

## 2020-12-18 DIAGNOSIS — Z89512 Acquired absence of left leg below knee: Secondary | ICD-10-CM

## 2020-12-18 MED ORDER — HYDROCODONE-ACETAMINOPHEN 10-325 MG PO TABS: 1.0000 | ORAL_TABLET | Freq: Three times a day (TID) | ORAL | 0 refills | Status: DC | PRN

## 2020-12-18 NOTE — Progress Notes (Signed)
SOILA, PRINTUP ([id number]) Visit Report for 12/18/2020 Arrival Information Details Patient Name: Date of Service: [name] [name] 12/18/2020 3:30 Hall Medical Record Number: [id number] Patient Account Number: [id number] Date of Birth/Sex: Treating RN: [birth year] (45 y.o. [name] Primary Care Thania Woodlief: [name] Other Clinician: Referring Khaleel Beckom: Treating Alexandrea Westergard/Extender: Mammie Russian, Ivan Anchors in Treatment: 12 Visit Information History Since Last Visit Added or deleted any medications: No Patient Arrived: Ambulatory Any new allergies or adverse reactions: No Arrival Time: 15:25 Had a fall or experienced change in No Accompanied By: alone activities of daily living that may affect Transfer Assistance: None risk of falls: Patient Identification Verified: Yes Signs or symptoms of abuse/neglect since last visito No Secondary Verification Process Completed: Yes Hospitalized since last visit: No Patient Requires Transmission-Based Precautions: No Implantable device outside of the clinic excluding No Patient Has Alerts: No cellular tissue based products placed in the center since last visit: Has Dressing in Place as Prescribed: Yes Pain Present Now: No Electronic Signature(s) Signed: 12/18/2020 5:28:50 Hall By: Levan Hurst RN, BSN Entered By: Levan Hurst on 12/18/2020 15:28:14 -------------------------------------------------------------------------------- Encounter Discharge Information Details Patient Name: Date of Service: [name], [name] Medical Record Number: [id number] Patient Account Number: [id number] Date of Birth/Sex: Treating RN: 01/01/1976 (45 y.o. [name] Primary Care Keisha Amer: [name] Other Clinician: Referring Dhiren Azimi: Treating Heinz Eckert/Extender: Mammie Russian, Ivan Anchors in Treatment: 12 Encounter Discharge Information Items Post Procedure Vitals Discharge Condition:  Stable Temperature (F): 98.4 Ambulatory Status: Wheelchair Pulse (bpm): 98 Discharge Destination: Home Respiratory Rate (breaths/min): 20 Transportation: Private Auto Blood Pressure (mmHg): 118/71 Accompanied By: alone Schedule Follow-up Appointment: Yes Clinical Summary of Care: Patient Declined Electronic Signature(s) Signed: 12/18/2020 5:28:50 Hall By: Levan Hurst RN, BSN Entered By: Levan Hurst on 12/18/2020 16:54:23 -------------------------------------------------------------------------------- Lower Extremity Assessment Details Patient Name: Date of Service: [name], Belinda [facility] 12/18/2020 3:30 Hall Medical Record Number: [id number] Patient Account Number: [id number] Date of Birth/Sex: Treating RN: Dec 30, 1975 (45 y.o. [name] Primary Care Lucielle Vokes: [name] Other Clinician: Referring Dianelly Ferran: Treating Jamya Starry/Extender: Mammie Russian, Ivan Anchors in Treatment: 12 Edema Assessment Assessed: [name]: No] [name]: No] Edema: [Left: Ye] [Right: s] Calf Left: Right: Point of Measurement: From Medial Instep 46 cm Ankle Left: Right: Point of Measurement: From Medial Instep 25.4 cm Vascular Assessment Pulses: Dorsalis Pedis Palpable: [Right:Yes] Electronic Signature(s) Signed: 12/18/2020 5:28:50 Hall By: Levan Hurst RN, BSN Entered By: Levan Hurst on 12/18/2020 15:51:14 -------------------------------------------------------------------------------- Multi Wound Chart Details Patient Name: Date of Service: [name], [name] Medical Record Number: [id number] Patient Account Number: [id number] Date of Birth/Sex: Treating RN: [birth year] (45 y.o. [name], [name] Primary Care Attikus Bartoszek: [name] Other Clinician: Referring Debany Vantol: Treating Jamarie [place]/Extender: Mammie Russian, Ivan Anchors in Treatment: 12 Vital Signs Height(in): 16 Capillary Blood Glucose(mg/dl): 293 Weight(lbs): 362 Pulse(bpm):  29 Body Mass Index(BMI): 20 Blood Pressure(mmHg): 118/71 Temperature(F): 98.4 Respiratory Rate(breaths/min): 20 Photos: [4:No Photos Right Amputation Site -] [5:No Photos Right, Posterior Lower Leg] [N/A:N/A N/A] Wound Location: [4:Transmetatarsal Gradually Appeared] [5:Not Known] [N/A:N/A] Wounding Event: [4:Diabetic Wound/Ulcer of the Lower] [5:Diabetic Wound/Ulcer of the Lower] [N/A:N/A] Primary Etiology: [4:Extremity Asthma, Chronic Obstructive] [5:Extremity Asthma, Chronic Obstructive] [N/A:N/A] Comorbid History: [4:Pulmonary Disease (COPD), Sleep Apnea, Congestive Heart Failure, Hypertension, Peripheral Venous Disease, Type II Diabetes, Osteoarthritis, Osteomyelitis, Neuropathy 03/06/2020] [5:Pulmonary Disease (COPD), Sleep Apnea, Congestive  Heart Failure, Hypertension, Peripheral Venous Disease, Type II Diabetes, Osteoarthritis, Osteomyelitis, Neuropathy 11/05/2020] [N/A:N/A] Date Acquired: [4:12] [5:0] [N/A:N/A] Weeks of Treatment: [  4:Open] [5:Open] [N/A:N/A] Wound Status: [4:1x0.9x0.2] [5:1.6x1.8x0.1] [N/A:N/A] Measurements L x W x D (cm) [4:0.707] [5:2.262] [N/A:N/A] A (cm) : rea [4:0.141] [5:0.226] [N/A:N/A] Volume (cm) : [4:18.20%] [5:N/A] [N/A:N/A] % Reduction in A [4:rea: 45.60%] [5:N/A] [N/A:N/A] % Reduction in Volume: [4:Grade 2] [5:Unable to visualize wound bed] [N/A:N/A] Classification: [4:Medium] [5:Medium] [N/A:N/A] Exudate A mount: [4:Serosanguineous] [5:Serosanguineous] [N/A:N/A] Exudate Type: [4:red, brown] [5:red, brown] [N/A:N/A] Exudate Color: [4:Distinct, outline attached] [5:Flat and Intact] [N/A:N/A] Wound Margin: [4:Small (1-33%)] [5:None Present (0%)] [N/A:N/A] Granulation A mount: [4:Red] [5:N/A] [N/A:N/A] Granulation Quality: [4:Large (67-100%)] [5:Large (67-100%)] [N/A:N/A] Necrotic A mount: [4:Adherent Slough] [5:Eschar, Adherent Slough] [N/A:N/A] Necrotic Tissue: [4:Fat Layer (Subcutaneous Tissue): Yes Fascia: No] [N/A:N/A] Exposed  Structures: [4:Fascia: No Tendon: No Muscle: No Joint: No Bone: No Medium (34-66%)] [5:Fat Layer (Subcutaneous Tissue): No Tendon: No Muscle: No Joint: No Bone: No None] [N/A:N/A] Epithelialization: [4:Debridement - Excisional] [5:Debridement - Excisional] [N/A:N/A] Debridement: Pre-procedure Verification/Time Out 15:55 [5:15:55] [N/A:N/A] Taken: [4:Lidocaine 4% Topical Solution] [5:Lidocaine 4% Topical Solution] [N/A:N/A] Pain Control: [4:Subcutaneous] [5:Necrotic/Eschar, Subcutaneous,] [N/A:N/A] Tissue Debrided: [4:Skin/Subcutaneous Tissue] [5:Slough Skin/Subcutaneous Tissue] [N/A:N/A] Level: [4:1] [5:2.88] [N/A:N/A] Debridement A (sq cm): [4:rea Curette] [5:Curette, Forceps, Scissors] [N/A:N/A] Instrument: [4:Moderate] [5:Moderate] [N/A:N/A] Bleeding: [4:Silver Nitrate] [5:Pressure] [N/A:N/A] Hemostasis Achieved: [4:0] [5:0] [N/A:N/A] Procedural Pain: [4:0] [5:0] [N/A:N/A] Post Procedural Pain: [4:Procedure was tolerated well] [5:Procedure was tolerated well] [N/A:N/A] Debridement Treatment Response: [4:1x1x0.2] [5:1.6x1.8x0.5] [N/A:N/A] Post Debridement Measurements L x W x D (cm) [4:0.157] [5:1.131] [N/A:N/A] Post Debridement Volume: (cm) [4:Debridement] [5:Debridement] [N/A:N/A] Treatment Notes Electronic Signature(s) Signed: 12/18/2020 5:01:02 Hall By: Linton Ham MD Signed: 12/18/2020 5:39:22 Hall By: Deon Pilling Entered By: Linton Ham on 12/18/2020 16:14:03 -------------------------------------------------------------------------------- Multi-Disciplinary Care Plan Details Patient Name: Date of Service: [name], Earley Favor [facility] 12/18/2020 3:30 Hall Medical Record Number: [id number] Patient Account Number: [id number] Date of Birth/Sex: Treating RN: 12-06-1976 (45 y.o. [name], [name] Primary Care Charlye Spare: [name] Other Clinician: Referring Lynda Capistran: Treating Geraldina Parrott/Extender: Mammie Russian, Ivan Anchors in Treatment: 12 Active Inactive Abuse / Safety /  Falls / Self Care Management Nursing Diagnoses: History of Falls Potential for injury related to falls Goals: Patient will not experience any injury related to falls Date Initiated: 09/22/2020 Target Resolution Date: 02/06/2021 Goal Status: Active Patient/caregiver will verbalize/demonstrate measures taken to prevent injury and/or falls Date Initiated: 09/22/2020 Target Resolution Date: 02/06/2021 Goal Status: Active Interventions: Assess Activities of Daily Living upon admission and as needed Assess fall risk on admission and as needed Assess: immobility, friction, shearing, incontinence upon admission and as needed Assess impairment of mobility on admission and as needed per policy Assess personal safety and home safety (as indicated) on admission and as needed Assess self care needs on admission and as needed Provide education on fall prevention Notes: Nutrition Nursing Diagnoses: Impaired glucose control: actual or potential Potential for alteratiion in Nutrition/Potential for imbalanced nutrition Goals: Patient/caregiver agrees to and verbalizes understanding of need to use nutritional supplements and/or vitamins as prescribed Date Initiated: 09/22/2020 Date Inactivated: 11/06/2020 Target Resolution Date: 11/06/2020 Goal Status: Met Patient/caregiver will maintain therapeutic glucose control Date Initiated: 09/22/2020 Target Resolution Date: 02/06/2021 Goal Status: Active Interventions: Assess HgA1c results as ordered upon admission and as needed Assess patient nutrition upon admission and as needed per policy Provide education on elevated blood sugars and impact on wound healing Provide education on nutrition Treatment Activities: Education provided on Nutrition : 10/14/2020 Notes: Electronic Signature(s) Signed: 12/18/2020 5:39:22 Hall By: Deon Pilling Entered By: Deon Pilling on 12/18/2020  15:59:20 -------------------------------------------------------------------------------- Pain Assessment Details Patient Name: Date  of Service: [name] [facility] 12/18/2020 3:30 Hall Medical Record Number: [id number] Patient Account Number: [id number] Date of Birth/Sex: Treating RN: [birth year] (45 y.o. [name] Primary Care Marietta Sikkema: [name] Other Clinician: Referring Marilyn Nihiser: Treating Duchess Armendarez/Extender: Mammie Russian, Ivan Anchors in Treatment: 12 Active Problems Location of Pain Severity and Description of Pain Patient Has Paino No Site Locations Pain Management and Medication Current Pain Management: Electronic Signature(s) Signed: 12/18/2020 5:28:50 Hall By: Levan Hurst RN, BSN Entered By: Levan Hurst on 12/18/2020 15:33:37 -------------------------------------------------------------------------------- Patient/Caregiver Education Details Patient Name: Date of Service: [name] [facility] 1/13/2022andnbsp3:30 Hall Medical Record Number: [id number] Patient Account Number: [id number] Date of Birth/Gender: Treating RN: [birth year] (45 y.o. [name] Primary Care Physician: [name] Other Clinician: Referring Physician: Treating Physician/Extender: Mammie Russian, Ivan Anchors in Treatment: 12 Education Assessment Education Provided To: Patient Education Topics Provided Elevated Blood Sugar/ Impact on Healing: Handouts: Elevated Blood Sugars: How Do They Affect Wound Healing Methods: Explain/Verbal Responses: Reinforcements needed Electronic Signature(s) Signed: 12/18/2020 5:39:22 Hall By: Deon Pilling Entered By: Deon Pilling on 12/18/2020 15:59:32 -------------------------------------------------------------------------------- Wound Assessment Details Patient Name: Date of Service: [name] [facility] 12/18/2020 3:30 Hall Medical Record Number: [id number] Patient Account Number: [id number] Date of Birth/Sex: Treating  RN: 20-Dec-1975 (45 y.o. [name] Primary Care Kamryn Gauthier: [name] Other Clinician: Referring Elizet Kaplan: Treating Norrine Ballester/Extender: Mammie Russian, Ivan Anchors in Treatment: 12 Wound Status Wound Number: 4 Primary Diabetic Wound/Ulcer of the Lower Extremity Etiology: Wound Location: Right Amputation Site - Transmetatarsal Wound Open Wounding Event: Gradually Appeared Status: Date Acquired: 03/06/2020 Comorbid Asthma, Chronic Obstructive Pulmonary Disease (COPD), Sleep Weeks Of Treatment: 12 History: Apnea, Congestive Heart Failure, Hypertension, Peripheral Venous Clustered Wound: No Disease, Type II Diabetes, Osteoarthritis, Osteomyelitis, Neuropathy Wound Measurements Length: (cm) 1 Width: (cm) 0.9 Depth: (cm) 0.2 Area: (cm) 0.707 Volume: (cm) 0.141 % Reduction in Area: 18.2% % Reduction in Volume: 45.6% Epithelialization: Medium (34-66%) Wound Description Classification: Grade 2 Wound Margin: Distinct, outline attached Exudate Amount: Medium Exudate Type: Serosanguineous Exudate Color: red, brown Foul Odor After Cleansing: No Slough/Fibrino Yes Wound Bed Granulation Amount: Small (1-33%) Exposed Structure Granulation Quality: Red Fascia Exposed: No Necrotic Amount: Large (67-100%) Fat Layer (Subcutaneous Tissue) Exposed: Yes Necrotic Quality: Adherent Slough Tendon Exposed: No Muscle Exposed: No Joint Exposed: No Bone Exposed: No Treatment Notes Wound #4 (Amputation Site - Transmetatarsal) Wound Laterality: Right Cleanser Normal Saline Discharge Instruction: Cleanse the wound with Normal Saline prior to applying a clean dressing using gauze sponges, not tissue or cotton balls. Peri-Wound Care Topical Primary Dressing Hydrofera Blue Ready Foam, 4x5 in Discharge Instruction: Apply to wound bed as instructed. moisten with saline. Secondary Dressing Woven Gauze Sponge, Non-Sterile 4x4 in Discharge Instruction: Apply over primary  dressing as directed. Secured With [employer], 4.5x3.1 (in/yd) Discharge Instruction: Secure with Kerlix as directed. Paper Tape, 2x10 (in/yd) Discharge Instruction: Secure dressing with tape as directed. Compression Wrap Compression Stockings Add-Ons Electronic Signature(s) Signed: 12/18/2020 5:28:50 Hall By: Levan Hurst RN, BSN Entered By: Levan Hurst on 12/18/2020 15:47:19 -------------------------------------------------------------------------------- Wound Assessment Details Patient Name: Date of Service: [name], Belinda [facility] 12/18/2020 3:30 Hall Medical Record Number: [id number] Patient Account Number: [id number] Date of Birth/Sex: Treating RN: [birth year] (45 y.o. [name] Primary Care Jaquasha Carnevale: [name] Other Clinician: Referring Amberlea Spagnuolo: Treating Rashi Giuliani/Extender: Mammie Russian, Ivan Anchors in Treatment: 12 Wound Status Wound Number: 5 Primary Diabetic Wound/Ulcer of the Lower Extremity Etiology: Wound Location: Right, Posterior Lower Leg Wound Open Wounding Event:  Not Known Status: Date Acquired: 11/05/2020 Comorbid Asthma, Chronic Obstructive Pulmonary Disease (COPD), Sleep Weeks Of Treatment: 0 History: Apnea, Congestive Heart Failure, Hypertension, Peripheral Venous Clustered Wound: No Disease, Type II Diabetes, Osteoarthritis, Osteomyelitis, Neuropathy Wound Measurements Length: (cm) 1.6 Width: (cm) 1.8 Depth: (cm) 0.1 Area: (cm) 2.262 Volume: (cm) 0.226 % Reduction in Area: % Reduction in Volume: Epithelialization: None Tunneling: No Undermining: No Wound Description Classification: Unable to visualize wound bed Wound Margin: Flat and Intact Exudate Amount: Medium Exudate Type: Serosanguineous Exudate Color: red, brown Foul Odor After Cleansing: No Slough/Fibrino Yes Wound Bed Granulation Amount: None Present (0%) Exposed Structure Necrotic Amount: Large (67-100%) Fascia Exposed: No Necrotic Quality:  Eschar, Adherent Slough Fat Layer (Subcutaneous Tissue) Exposed: No Tendon Exposed: No Muscle Exposed: No Joint Exposed: No Bone Exposed: No Treatment Notes Wound #5 (Lower Leg) Wound Laterality: Right, Posterior Cleanser Soap and Water Discharge Instruction: May shower and wash wound with dial antibacterial soap and water prior to dressing change. Peri-Wound Care Topical Primary Dressing Santyl Ointment Discharge Instruction: Apply nickel thick amount to wound bed as instructed Secondary Dressing Woven Gauze Sponge, Non-Sterile 4x4 in Discharge Instruction: Apply over primary dressing as directed. Secured With 24M Medipore H Soft Cloth Surgical T 4 x 2 (in/yd) ape Discharge Instruction: Secure dressing with tape as directed. Compression Wrap Compression Stockings Add-Ons Electronic Signature(s) Signed: 12/18/2020 5:28:50 Hall By: Levan Hurst RN, BSN Entered By: Levan Hurst on 12/18/2020 15:51:00 -------------------------------------------------------------------------------- Vitals Details Patient Name: Date of Service: [name], [name] Medical Record Number: [id number] Patient Account Number: [id number] Date of Birth/Sex: Treating RN: 04-Mar-1976 (45 y.o. [name] Primary Care Shequita Peplinski: [name] Other Clinician: Referring Aariel Ems: Treating Benjamine Strout/Extender: Mammie Russian, Ivan Anchors in Treatment: 12 Vital Signs Time Taken: 15:30 Temperature (F): 98.4 Height (in): 62 Pulse (bpm): 98 Weight (lbs): 362 Respiratory Rate (breaths/min): 20 Body Mass Index (BMI): 66.2 Blood Pressure (mmHg): 118/71 Capillary Blood Glucose (mg/dl): 293 Reference Range: 80 - 120 mg / dl Notes glucose per pt report Electronic Signature(s) Signed: 12/18/2020 5:28:50 Hall By: Levan Hurst RN, BSN Entered By: Levan Hurst on 12/18/2020 15:30:33

## 2020-12-18 NOTE — Progress Notes (Unsigned)
  Date of Visit: 12/18/2020   SUBJECTIVE:   HPI:  [name] presents today for follow up.  Diabetes - taking lantus 80u twice daily, novolog 30u three times daily, and victoza 1.2mg  daily. She self-increaesd her novolog to 30u three times daily from 25u three times daily. Fasting sugars are all running in the mid to high 200s or higher, never gets any sugars in the 100s.   Leg sore - finished course of clindamycin for the skin breakdown on her R posterior calf. It is overall improving but she would like me to look at it today. Has appointment with wound care center this afternoon for her R foot.  Request gel overlay mattress for her new hospital bed at home  Pain medication refills - needs refill on her norco. Requests she can get it filled today as her pharmacy delivers and they cannot deliver on Saturday (and we are having expected inclement weather this weekend).  Paperwork - needs forms completed for  1) her mom's FMLA so mom can accompany her to appts. Mom's # is [phone]. Patient is fine with me discussing ehr care with her mother (has DPR on file). Mom needs to be out for 1-2 days a week for 1 day at a time. 2) [employer] Program through [employer]   She brought me paperwork for both fo these issues.  OBJECTIVE:   BP 110/70   Pulse 87   LMP 12/06/2020 (Approximate)   SpO2 96%  Gen: no acute distress, pleasant, cooeprative HEENT: normocephalic, atraumatic  Lungs: normal work of breathing  Neuro: alert, speech normal, grossly nonfocal Ext: R posterior calf with 2cm area of slightly moist yellow material and mild surrounding erythema, improved from prior. No fluctuance, warmth, or tenderness to palpation  ASSESSMENT/PLAN:   Health maintenance:  -COVID booster shot given today  DM (diabetes mellitus) type II uncontrolled, periph vascular disorder ([place]) Has upcoming appointment with endocrinology Quite uncontrolled based on blood sugars reported by patient Will  increase lantus to 90u three times daily  Morbid obesity ([place]) Reinforced the message patient received from cardiologist that she is at high risk for death prior to age 26 given her weight and current comorbidities. She is motivated to make changes. Encouraged her in that effort. Has appointment to discuss bariatric surgery.  Encounter for chronic pain management Stable overall. Refill medications today. [place] PMP reviewed with appropraite findings. Ok to fill today.  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[name]) Impaired mobility. Will order gel overlay mattress as requested by patient   R calf wound Calf does not appear acutely infected to me Patient has appointment with wound care this afternoon - encouraged her to have them evaluate it as that is their area of expertise  Will complete paperwork as requested by patient   FOLLOW UP: Follow up in 1 mo for above issues  [name], [place]

## 2020-12-18 NOTE — Patient Instructions (Signed)
COVID booster today Will fill out paperwork and order gel overlay  Refilled pain medication Increase lantus to 90u twice daily   Follow up in 1 month   Be well, Dr. [name]

## 2020-12-18 NOTE — Progress Notes (Signed)
ALITHIA, PERA ([id number]) Visit Report for 12/18/2020 Debridement Details Patient Name: Date of Service: [name] [facility] 12/18/2020 3:30 PM Medical Record Number: [id number] Patient Account Number: [id number] Date of Birth/Sex: Treating RN: 1976/11/15 (45 y.o. [name] Primary Care Provider: [name] Other Clinician: Referring Provider: Treating Provider/Extender: Mammie Russian, Ivan Anchors in Treatment: 12 Debridement Performed for Assessment: Wound #4 Right Amputation Site - Transmetatarsal Performed By: Physician Ricard Dillon., MD Debridement Type: Debridement Severity of Tissue Pre Debridement: Fat layer exposed Level of Consciousness (Pre-procedure): Awake and Alert Pre-procedure Verification/Time Out Yes - 15:55 Taken: Start Time: 15:56 Pain Control: Lidocaine 4% T opical Solution T Area Debrided (L x W): otal 1 (cm) x 1 (cm) = 1 (cm) Tissue and other material debrided: Viable, Non-Viable, Subcutaneous, Skin: Dermis , Fibrin/Exudate Level: Skin/Subcutaneous Tissue Debridement Description: Excisional Instrument: Curette Bleeding: Moderate Hemostasis Achieved: Silver Nitrate End Time: 16:00 Procedural Pain: 0 Post Procedural Pain: 0 Response to Treatment: Procedure was tolerated well Level of Consciousness (Post- Awake and Alert procedure): Post Debridement Measurements of Total Wound Length: (cm) 1 Width: (cm) 1 Depth: (cm) 0.2 Volume: (cm) 0.157 Character of Wound/Ulcer Post Debridement: Improved Severity of Tissue Post Debridement: Fat layer exposed Post Procedure Diagnosis Same as Pre-procedure Electronic Signature(s) Signed: 12/18/2020 5:01:02 PM By: Linton Ham MD Signed: 12/18/2020 5:39:22 PM By: Deon Pilling Entered By: Linton Ham on 12/18/2020 16:19:34 -------------------------------------------------------------------------------- Debridement Details Patient Name: Date of Service: [name], TA [facility] 12/18/2020 3:30  PM Medical Record Number: [id number] Patient Account Number: [id number] Date of Birth/Sex: Treating RN: December 14, 1975 (45 y.o. [name] Primary Care Provider: [name] Other Clinician: Referring Provider: Treating Provider/Extender: Mammie Russian, Ivan Anchors in Treatment: 12 Debridement Performed for Assessment: Wound #5 Right,Posterior Lower Leg Performed By: Physician Ricard Dillon., MD Debridement Type: Debridement Severity of Tissue Pre Debridement: Fat layer exposed Level of Consciousness (Pre-procedure): Awake and Alert Pre-procedure Verification/Time Out Yes - 15:55 Taken: Start Time: 15:56 Pain Control: Lidocaine 4% T opical Solution T Area Debrided (L x W): otal 1.6 (cm) x 1.8 (cm) = 2.88 (cm) Tissue and other material debrided: Viable, Eschar, Slough, Subcutaneous, Fibrin/Exudate, Slough Level: Skin/Subcutaneous Tissue Debridement Description: Excisional Instrument: Curette, Forceps, Scissors Bleeding: Moderate Hemostasis Achieved: Pressure End Time: 16:00 Procedural Pain: 0 Post Procedural Pain: 0 Response to Treatment: Procedure was tolerated well Level of Consciousness (Post- Awake and Alert procedure): Post Debridement Measurements of Total Wound Length: (cm) 1.6 Width: (cm) 1.8 Depth: (cm) 0.5 Volume: (cm) 1.131 Character of Wound/Ulcer Post Debridement: Requires Further Debridement Severity of Tissue Post Debridement: Fat layer exposed Post Procedure Diagnosis Same as Pre-procedure Electronic Signature(s) Signed: 12/18/2020 5:01:02 PM By: Linton Ham MD Signed: 12/18/2020 5:39:22 PM By: Deon Pilling Entered By: Linton Ham on 12/18/2020 16:19:43 -------------------------------------------------------------------------------- HPI Details Patient Name: Date of Service: [name], [name] 12/18/2020 3:30 PM Medical Record Number: [id number] Patient Account Number: [id number] Date of Birth/Sex: Treating RN: [birth year] (45  y.o. [name] Primary Care Provider: [name] Other Clinician: Referring Provider: Treating Provider/Extender: Mammie Russian, Ivan Anchors in Treatment: 12 History of Present Illness HPI Description: 05/30/19 patient presents today for initial evaluation our clinic concerning significant wounds that she has in the right gluteal region as well as in the left abdominal area. She also has a pressure injury to her trans metatarsal amputation site on the right which does not appear to be as looking at this time but nonetheless does need to close. This occurred while she was  in the hospital. The patient was admitted to the hospital on 05/17/19 discharged on 05/25/19. This was due to the significant abscess in the right gluteal region as well as in the left panus region. She has a history of diabetes which is definitely not under good control of the most useful view of an agency being 17.5. She also has hypertension, COPD, and is morbidly obese. Unfortunately the wound and the right gluteal region extends right up and adjacent to the anus which is going to make the Wound VAC impossible at this point. Nonetheless I think that we're gonna have to basically pack this with wet to dry packing at this time in order to hopefully achieve improvement. Nonetheless this is a significant wound with her diabetes be not under control along with her size and again the nature of the wound I think this is gonna take a very long time to heal. 07/04/2019 upon evaluation today patient actually appears to be doing better in regard to her wounds. Her foot ulcer actually appears to be completely healed which is great the abdominal ulcer has filled in quite nicely and seems to be doing well although there is a lot of moisture I think that she would benefit from a silver alginate dressing. Nonetheless in regard to the wound on her right gluteal region this showed signs of improvement today still this is  a fairly significant ulcer unfortunately. 08/01/2019 on evaluation patient presents for follow-up concerning her wounds over the right gluteal region as well as the left abdominal region. Fortunately both areas are showing signs of improvement which is good news. In fact significant improvement. Spent about a month since have seen her last. Overall I am extremely happy though with the findings of how the wounds have progressed at this point. READMISSION 09/22/2020 This is a now 45 year old woman previously in this clinic for a right gluteal abscess for 3 or 4 visits in 2020. She is a poorly controlled type II diabetic with the last hemoglobin A1c I see in epic at 12.7 in July 2021. She tells [place] that she is here for review of a wound on the tip of her right first metatarsal transmetatarsal amputation. She had an angiogram and angioplasty on 12/20/2018 with angioplasty of her anterior tibial and posterior tibial arteries. She has been followed by Dr. Doran Durand most recent treatment since August I think has been Santyl. She was given a 1 week course of Keflex in mid September. The patient's had an x-ray and also an MRI in August 2021 that did not show osteomyelitis but edema ABI in our clinic was 1.19 Past medical history includes hypertension, hyperlipidemia, COPD, bipolar disorder, poorly controlled type 2 diabetes with PAD. Left BKA, right transmetatarsal amputation, heart failure with preserved ejection fraction, she is minimally I am ambulatory walking only from her wheelchair to the bathroom with her walker. She only wears a stocking on her foot. Otherwise she is in wheelchair or in bed all day long. 11/1; patient readmitted to the clinic 2 weeks ago. She has a wound on the tip of the right first metatarsal transmetatarsal amputation site she has been using Santyl. She has Medicaid and we have to apply for assistance if we are going to continue with Santyl although I am hopeful that we will be able to  change from the [facility] next week. 11/9; area on the tip of the right first metatarsal amputation site. She has been using Santyl. She was in the hospital overnight for chest pain  and they gave her another tube of Santyl which is nice since she has Medicaid and Medicaid would not pay for that. Her wound is smaller 12/2; tip of the right first metatarsal amputation site. She has been using Santyl. No changes wound dimension however I think the surface of the wound is better 12/28; patient has not been here in 3 and half weeks I think transportation issues. We gave her Hydrofera Blue last time however she switched back to [place] last week because somebody told her the wound was larger. The wound is actually measuring smaller and looks healthy. 1/13; since the patient was last here she was actually seen twice in the emergency department most recently on 12/30 this was for a painful small area on the right posterior calf superiorly. Previously she was seen in the ER on 12/19 for this problem but she did not tell us about this the last time she was here. There was some question of this being a spider bite based I think on somebody killing a spider in her apartment. When she was in the ER on 12/30 she complained of increasing pain and swelling. She had previously been given doxycycline but they treated with IV clindamycin transitioning to oral clindamycin. She is still using Hydrofera Blue to the area on the medial transmit amputation on the right this is not changed much today Electronic Signature(s) Signed: 12/18/2020 5:01:02 PM By: Linton Ham MD Entered By: Linton Ham on 12/18/2020 16:20:32 -------------------------------------------------------------------------------- Physical Exam Details Patient Name: Date of Service: [name], [name] 12/18/2020 3:30 PM Medical Record Number: [id number] Patient Account Number: [id number] Date of Birth/Sex: Treating RN: [birth year] (45 y.o. [name] Primary Care Provider: [name] Other Clinician: Referring Provider: Treating Provider/Extender: Mammie Russian, Ivan Anchors in Treatment: 12 Constitutional Sitting or standing Blood Pressure is within target range for patient.. Pulse regular and within target range for patient.Marland Kitchen Respirations regular, non-labored and within target range.. Temperature is normal and within the target range for the patient.Marland Kitchen Appears in no distress. Cardiovascular Pedal pulse was palpable. Notes Wound exam Right medial first MTP transmetatarsal amputation site. Not much different from 2 weeks ago. I used a #3 curette to debride the edges of this. Hemostasis with silver nitrate. The more difficult area is actually on the posterior mid calf superiorly. Small wound with a completely necrotic surface. Some surrounding erythema. I attempted to debride this with a #3 curette however I could not remove any of the debris without causing excessive pain to the patient. I therefore used injectable lidocaine to anesthetize the area with a circumferential block and then proceeded to debride this however she did not tolerate this well either and to be truthful the amount of necrosis underneath the surface of this was surprising. Deep punched out area with complete necrosis however I was able to get down to a reasonably healthy looking surface. There was no purulent drainage Electronic Signature(s) Signed: 12/18/2020 5:01:02 PM By: Linton Ham MD Entered By: Linton Ham on 12/18/2020 16:22:33 -------------------------------------------------------------------------------- Physician Orders Details Patient Name: Date of Service: [name], TA [facility] 12/18/2020 3:30 PM Medical Record Number: [id number] Patient Account Number: [id number] Date of Birth/Sex: Treating RN: 04/04/1976 (45 y.o. [name] Primary Care Provider: [name] Other Clinician: Referring Provider: Treating  Provider/Extender: Mammie Russian, Ivan Anchors in Treatment: 12 Verbal / Phone Orders: No Diagnosis Coding ICD-10 Coding Code Description E11.621 Type 2 diabetes mellitus with foot ulcer L97.512 Non-pressure chronic ulcer of other part of right  foot with fat layer exposed Z89.512 Acquired absence of left leg below knee E11.51 Type 2 diabetes mellitus with diabetic peripheral angiopathy without gangrene Follow-up Appointments Return Appointment in 1 week. Bathing/ Shower/ Hygiene May shower with protection but do not get wound dressing(s) wet. - use a cast protector when not changing the dressing. May shower and wash wound with soap and water. - with dressing changes only. Edema Control - Lymphedema / SCD / Other Right Lower Extremity Elevate legs to the level of the heart or above for 30 minutes daily and/or when sitting, a frequency of: - 3-4 times throughout the day. Avoid standing for long periods of time. Moisturize legs daily. Off-Loading Other: - ensure no pressure to foot wound. Wound Treatment Wound #4 - Amputation Site - Transmetatarsal Wound Laterality: Right Cleanser: Normal Saline (Generic) Every Other Day/30 Days Discharge Instructions: Cleanse the wound with Normal Saline prior to applying a clean dressing using gauze sponges, not tissue or cotton balls. Prim Dressing: Hydrofera Blue Ready Foam, 4x5 in Every Other Day/30 Days ary Discharge Instructions: Apply to wound bed as instructed. moisten with saline. Secondary Dressing: Woven Gauze Sponge, Non-Sterile 4x4 in (Generic) Every Other Day/30 Days Discharge Instructions: Apply over primary dressing as directed. Secured With: [employer], 4.5x3.1 (in/yd) (DME) (Generic) Every Other Day/30 Days Discharge Instructions: Secure with Kerlix as directed. Secured With: Paper Tape, 2x10 (in/yd) (Generic) Every Other Day/30 Days Discharge Instructions: Secure dressing with tape as directed. Wound #5 -  Lower Leg Wound Laterality: Right, Posterior Cleanser: Soap and Water 1 x Per Day/30 Days Discharge Instructions: May shower and wash wound with dial antibacterial soap and water prior to dressing change. Prim Dressing: Santyl Ointment 1 x Per Day/30 Days ary Discharge Instructions: Apply nickel thick amount to wound bed as instructed Secondary Dressing: Woven Gauze Sponge, Non-Sterile 4x4 in 1 x Per Day/30 Days Discharge Instructions: Apply over primary dressing as directed. Secured With: 54M Medipore H Soft Cloth Surgical T 4 x 2 (in/yd) 1 x Per Day/30 Days ape Discharge Instructions: Secure dressing with tape as directed. Electronic Signature(s) Signed: 12/18/2020 5:01:02 PM By: Linton Ham MD Signed: 12/18/2020 5:39:22 PM By: Deon Pilling Entered By: Deon Pilling on 12/18/2020 16:11:17 -------------------------------------------------------------------------------- Problem List Details Patient Name: Date of Service: [name], TA [facility] 12/18/2020 3:30 PM Medical Record Number: [id number] Patient Account Number: [id number] Date of Birth/Sex: Treating RN: Jan 20, 1976 (45 y.o. [name], [name] Primary Care Provider: [name] Other Clinician: Referring Provider: Treating Provider/Extender: Mammie Russian, Ivan Anchors in Treatment: 12 Active Problems ICD-10 Encounter Code Description Active Date MDM Diagnosis E11.621 Type 2 diabetes mellitus with foot ulcer 09/22/2020 No Yes L97.512 Non-pressure chronic ulcer of other part of right foot with fat layer exposed 09/22/2020 No Yes Z89.512 Acquired absence of left leg below knee 09/22/2020 No Yes E11.51 Type 2 diabetes mellitus with diabetic peripheral angiopathy without gangrene 09/22/2020 No Yes L97.218 Non-pressure chronic ulcer of right calf with other specified severity 12/18/2020 No Yes Inactive Problems Resolved Problems Electronic Signature(s) Signed: 12/18/2020 5:01:02 PM By: Linton Ham MD Entered By:  Linton Ham on 12/18/2020 16:13:56 -------------------------------------------------------------------------------- Progress Note Details Patient Name: Date of Service: [name], [name] 12/18/2020 3:30 PM Medical Record Number: [id number] Patient Account Number: [id number] Date of Birth/Sex: Treating RN: 11-22-1976 (45 y.o. [name] Primary Care Provider: [name] Other Clinician: Referring Provider: Treating Provider/Extender: Mammie Russian, Ivan Anchors in Treatment: 12 Subjective History of Present Illness (HPI) 05/30/19 patient presents today for initial evaluation  our clinic concerning significant wounds that she has in the right gluteal region as well as in the left abdominal area. She also has a pressure injury to her trans metatarsal amputation site on the right which does not appear to be as looking at this time but nonetheless does need to close. This occurred while she was in the hospital. The patient was admitted to the hospital on 05/17/19 discharged on 05/25/19. This was due to the significant abscess in the right gluteal region as well as in the left panus region. She has a history of diabetes which is definitely not under good control of the most useful view of an agency being 17.5. She also has hypertension, COPD, and is morbidly obese. Unfortunately the wound and the right gluteal region extends right up and adjacent to the anus which is going to make the Wound VAC impossible at this point. Nonetheless I think that we're gonna have to basically pack this with wet to dry packing at this time in order to hopefully achieve improvement. Nonetheless this is a significant wound with her diabetes be not under control along with her size and again the nature of the wound I think this is gonna take a very long time to heal. 07/04/2019 upon evaluation today patient actually appears to be doing better in regard to her wounds. Her foot ulcer actually appears to  be completely healed which is great the abdominal ulcer has filled in quite nicely and seems to be doing well although there is a lot of moisture I think that she would benefit from a silver alginate dressing. Nonetheless in regard to the wound on her right gluteal region this showed signs of improvement today still this is a fairly significant ulcer unfortunately. 08/01/2019 on evaluation patient presents for follow-up concerning her wounds over the right gluteal region as well as the left abdominal region. Fortunately both areas are showing signs of improvement which is good news. In fact significant improvement. Spent about a month since have seen her last. Overall I am extremely happy though with the findings of how the wounds have progressed at this point. READMISSION 09/22/2020 This is a now 45 year old woman previously in this clinic for a right gluteal abscess for 3 or 4 visits in 2020. She is a poorly controlled type II diabetic with the last hemoglobin A1c I see in epic at 12.7 in July 2021. She tells [place] that she is here for review of a wound on the tip of her right first metatarsal transmetatarsal amputation. She had an angiogram and angioplasty on 12/20/2018 with angioplasty of her anterior tibial and posterior tibial arteries. She has been followed by Dr. Doran Durand most recent treatment since August I think has been Santyl. She was given a 1 week course of Keflex in mid September. The patient's had an x-ray and also an MRI in August 2021 that did not show osteomyelitis but edema ABI in our clinic was 1.19 Past medical history includes hypertension, hyperlipidemia, COPD, bipolar disorder, poorly controlled type 2 diabetes with PAD. Left BKA, right transmetatarsal amputation, heart failure with preserved ejection fraction, she is minimally I am ambulatory walking only from her wheelchair to the bathroom with her walker. She only wears a stocking on her foot. Otherwise she is in wheelchair or  in bed all day long. 11/1; patient readmitted to the clinic 2 weeks ago. She has a wound on the tip of the right first metatarsal transmetatarsal amputation site she has been using Santyl. She has  Medicaid and we have to apply for assistance if we are going to continue with Santyl although I am hopeful that we will be able to change from the [facility] next week. 11/9; area on the tip of the right first metatarsal amputation site. She has been using Santyl. She was in the hospital overnight for chest pain and they gave her another tube of Santyl which is nice since she has Medicaid and Medicaid would not pay for that. Her wound is smaller 12/2; tip of the right first metatarsal amputation site. She has been using Santyl. No changes wound dimension however I think the surface of the wound is better 12/28; patient has not been here in 3 and half weeks I think transportation issues. We gave her Hydrofera Blue last time however she switched back to [place] last week because somebody told her the wound was larger. The wound is actually measuring smaller and looks healthy. 1/13; since the patient was last here she was actually seen twice in the emergency department most recently on 12/30 this was for a painful small area on the right posterior calf superiorly. Previously she was seen in the ER on 12/19 for this problem but she did not tell us about this the last time she was here. There was some question of this being a spider bite based I think on somebody killing a spider in her apartment. When she was in the ER on 12/30 she complained of increasing pain and swelling. She had previously been given doxycycline but they treated with IV clindamycin transitioning to oral clindamycin. She is still using Hydrofera Blue to the area on the medial transmit amputation on the right this is not changed much today Objective Constitutional Sitting or standing Blood Pressure is within target range for patient.. Pulse  regular and within target range for patient.Marland Kitchen Respirations regular, non-labored and within target range.. Temperature is normal and within the target range for the patient.Marland Kitchen Appears in no distress. Vitals Time Taken: 3:30 PM, Height: 62 in, Weight: 362 lbs, BMI: 66.2, Temperature: 98.4 F, Pulse: 98 bpm, Respiratory Rate: 20 breaths/min, Blood Pressure: 118/71 mmHg, Capillary Blood Glucose: 293 mg/dl. General Notes: glucose per pt report Cardiovascular Pedal pulse was palpable. General Notes: Wound exam oo Right medial first MTP transmetatarsal amputation site. Not much different from 2 weeks ago. I used a #3 curette to debride the edges of this. Hemostasis with silver nitrate. oo The more difficult area is actually on the posterior mid calf superiorly. Small wound with a completely necrotic surface. Some surrounding erythema. I attempted to debride this with a #3 curette however I could not remove any of the debris without causing excessive pain to the patient. I therefore used injectable lidocaine to anesthetize the area with a circumferential block and then proceeded to debride this however she did not tolerate this well either and to be truthful the amount of necrosis underneath the surface of this was surprising. Deep punched out area with complete necrosis however I was able to get down to a reasonably healthy looking surface. There was no purulent drainage Integumentary (Hair, Skin) Wound #4 status is Open. Original cause of wound was Gradually Appeared. 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 The wound measures 1cm length x 0.9cm width x 0.2cm depth; 0.707cm^2 area and 0.141cm^3 volume. There is Fat Layer (Subcutaneous Tissue) exposed. There is a medium amount of serosanguineous drainage noted. The wound margin is distinct with the outline attached to the wound base.  There is small (1-33%) red granulation within the wound bed. There is a large (67-100%) amount  of necrotic tissue within the wound bed including Adherent Slough. Wound #5 status is Open. Original cause of wound was Not Known. The wound is located on the Right,Posterior Lower Leg. The wound measures 1.6cm length x 1.8cm width x 0.1cm depth; 2.262cm^2 area and 0.226cm^3 volume. There is no tunneling or undermining noted. There is a medium amount of serosanguineous drainage noted. The wound margin is flat and intact. There is no granulation within the wound bed. There is a large (67-100%) amount of necrotic tissue within the wound bed including Eschar and Adherent Slough. 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 There was a Excisional Skin/Subcutaneous Tissue Debridement with a total area of 1 sq cm performed by Ricard Dillon., MD. With the following instrument(s): Curette to remove Viable and Non-Viable tissue/material. Material removed includes Subcutaneous Tissue, Skin: Dermis, and Fibrin/Exudate after achieving pain control using Lidocaine 4% Topical Solution. A time out was conducted at 15:55, prior to the start of the procedure. A Moderate amount of bleeding was controlled with Silver Nitrate. The procedure was tolerated well with a pain level of 0 throughout and a pain level of 0 following the procedure. Post Debridement Measurements: 1cm length x 1cm width x 0.2cm depth; 0.157cm^3 volume. Character of Wound/Ulcer Post Debridement is improved. Severity of Tissue Post  Debridement is: Fat layer exposed. Post procedure Diagnosis Wound #4: Same as Pre-Procedure Wound #5 Pre-procedure diagnosis of Wound #5 is a Diabetic Wound/Ulcer of the Lower Extremity located on the Right,Posterior Lower Leg .Severity of Tissue Pre Debridement is: Fat layer exposed. There was a Excisional Skin/Subcutaneous Tissue Debridement with a total area of 2.88 sq cm performed by Ricard Dillon., MD. With the following instrument(s): Curette, Forceps, and Scissors to remove Viable tissue/material. Material removed includes Eschar, Subcutaneous Tissue, Slough, and Fibrin/Exudate after achieving pain control using Lidocaine 4% T opical Solution. A time out was conducted at 15:55, prior to the start of the procedure. A Moderate amount of bleeding was controlled with Pressure. The procedure was tolerated well with a pain level of 0 throughout and a pain level of 0 following the procedure. Post Debridement Measurements: 1.6cm length x 1.8cm width x 0.5cm depth; 1.131cm^3 volume. Character of Wound/Ulcer Post Debridement requires further debridement. Severity of Tissue Post Debridement is: Fat layer exposed. Post procedure Diagnosis Wound #5: Same as Pre-Procedure Plan Follow-up Appointments: Return Appointment in 1 week. Bathing/ Shower/ Hygiene: May shower with protection but do not get wound dressing(s) wet. - use a cast protector when not changing the dressing. May shower and wash wound with soap and water. - with dressing changes only. Edema Control - Lymphedema / SCD / Other: Elevate legs to the level of the heart or above for 30 minutes daily and/or when sitting, a frequency of: - 3-4 times throughout the day. Avoid standing for long periods of time. Moisturize legs daily. Off-Loading: Other: - ensure no pressure to foot wound. WOUND #4: - Amputation Site - Transmetatarsal  Wound Laterality: Right Cleanser: Normal Saline (Generic) Every Other Day/30 Days Discharge Instructions:  Cleanse the wound with Normal Saline prior to applying a clean dressing using gauze sponges, not tissue or cotton balls. Prim Dressing: Hydrofera Blue Ready Foam, 4x5 in Every Other Day/30 Days ary Discharge Instructions: Apply to wound bed as instructed. moisten with saline. Secondary Dressing: Woven Gauze Sponge, Non-Sterile 4x4 in (Generic) Every Other Day/30 Days Discharge Instructions: Apply over primary dressing as directed. Secured With: [employer], 4.5x3.1 (in/yd) (DME) (Generic) Every Other Day/30 Days Discharge Instructions: Secure with Kerlix as directed. Secured With: Paper T ape, 2x10 (in/yd) (Generic) Every Other Day/30 Days Discharge Instructions: Secure dressing with tape as directed. WOUND #5: - Lower Leg Wound Laterality: Right, Posterior Cleanser: Soap and Water 1 x Per Day/30 Days Discharge Instructions: May shower and wash wound with dial antibacterial soap and water prior to dressing change. Prim Dressing: Santyl Ointment 1 x Per Day/30 Days ary Discharge Instructions: Apply nickel thick amount to wound bed as instructed Secondary Dressing: Woven Gauze Sponge, Non-Sterile 4x4 in 1 x Per Day/30 Days Discharge Instructions: Apply over primary dressing as directed. Secured With: 90M Medipore H Soft Cloth Surgical T 4 x 2 (in/yd) 1 x Per Day/30 Days ape Discharge Instructions: Secure dressing with tape as directed. 1. The patient's original wound is about the same. I debrided the callus and thick edge of the wound and debris from the surface hopefully of this will allow healing I continued with Hydrofera Blue 2. The area on the posterior calf a bit unusual about the size of a dime but complete tissue necrosis going down roughly 4 mm. Debridement was very difficult. The patient has Santyl at home about half a tube and that is fortunate we are going to have to use this apparently we ordered this for her before Medicaid changed over. Etiology of this is not completely  certain the spider bite issue was because she had previously had a friend who killed a spider in her apartment not because she saw a spider on her skin. Nevertheless this is a small necrotic wound. She thinks it also could have been trauma on her wheelchair although this is a very unusual appearance. 3. I did not add any additional antibiotics she has been on antibiotics for most of 2 weeks. Electronic Signature(s) Signed: 12/18/2020 5:01:02 PM By: Linton Ham MD Entered By: Linton Ham on 12/18/2020 16:25:14 -------------------------------------------------------------------------------- SuperBill Details Patient Name: Date of Service: [name], TA [facility] 12/18/2020 Medical Record Number: [id number] Patient Account Number: [id number] Date of Birth/Sex: Treating RN: [birth year] (45 y.o. 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 Facility Procedures CPT4 Code: [id number] Description: 11042 - DEB SUBQ TISSUE 20 SQ CM/< ICD-10 Diagnosis Description 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

## 2020-12-19 NOTE — Assessment & Plan Note (Signed)
Impaired mobility. Will order gel overlay mattress as requested by patient

## 2020-12-19 NOTE — Assessment & Plan Note (Signed)
Has upcoming appointment with endocrinology Quite uncontrolled based on blood sugars reported by patient Will increase lantus to 90u three times daily

## 2020-12-19 NOTE — Assessment & Plan Note (Signed)
>>  ASSESSMENT AND PLAN FOR INSULIN DEPENDENT TYPE 2 DIABETES MELLITUS ([place]) WRITTEN ON 12/19/2020  5:46 PM BY Griffin Gerrard, [name], MD  Has upcoming appointment with endocrinology Quite uncontrolled based on blood sugars reported by patient Will increase lantus to 90u three times daily

## 2020-12-19 NOTE — Assessment & Plan Note (Signed)
Reinforced the message patient received from cardiologist that she is at high risk for death prior to age 45 given her weight and current comorbidities. She is motivated to make changes. Encouraged her in that effort. Has appointment to discuss bariatric surgery.

## 2020-12-19 NOTE — Assessment & Plan Note (Signed)
Stable overall. Refill medications today. [place] [employer] reviewed with appropraite findings. Ok to fill today.

## 2020-12-25 DIAGNOSIS — M86172 Other acute osteomyelitis, left ankle and foot: Secondary | ICD-10-CM | POA: Diagnosis not present

## 2020-12-25 DIAGNOSIS — G4733 Obstructive sleep apnea (adult) (pediatric): Secondary | ICD-10-CM | POA: Diagnosis not present

## 2020-12-25 DIAGNOSIS — J45909 Unspecified asthma, uncomplicated: Secondary | ICD-10-CM | POA: Diagnosis not present

## 2020-12-25 DIAGNOSIS — E669 Obesity, unspecified: Secondary | ICD-10-CM | POA: Diagnosis not present

## 2020-12-29 ENCOUNTER — Other Ambulatory Visit (HOSPITAL_COMMUNITY): Payer: Medicaid Other

## 2020-12-30 ENCOUNTER — Telehealth: Payer: Self-pay

## 2020-12-30 NOTE — Telephone Encounter (Signed)
Pt calling to check on status of FMLA paperwork. Please call her to advise with updates.

## 2020-12-31 ENCOUNTER — Ambulatory Visit: Payer: Medicaid Other | Admitting: Internal Medicine

## 2021-01-01 ENCOUNTER — Other Ambulatory Visit: Payer: Self-pay | Admitting: Family Medicine

## 2021-01-01 ENCOUNTER — Encounter: Payer: Self-pay | Admitting: Family Medicine

## 2021-01-01 DIAGNOSIS — I5033 Acute on chronic diastolic (congestive) heart failure: Secondary | ICD-10-CM

## 2021-01-01 NOTE — Telephone Encounter (Signed)
Patient calling again regarding the paperwork that was left with [name] on January 13th. Please call patient with an update.

## 2021-01-01 NOTE — Telephone Encounter (Signed)
Called both patient and her mother to discuss forms.  Completed FMLA for patient's mother. Will make copy for scanning and place remainder in outgoing fax pile. Request is for this to be faxed back to patient's mother Roschelle Calandra at [phone].  Also completed [employer] forms and will fax back to [phone] as requested. Will place copy in to be scanned, and original in outgoing fax pile.  Patient/mother appreciative.  [name], MD

## 2021-01-02 ENCOUNTER — Other Ambulatory Visit: Payer: Self-pay

## 2021-01-02 ENCOUNTER — Encounter (HOSPITAL_BASED_OUTPATIENT_CLINIC_OR_DEPARTMENT_OTHER): Payer: Medicaid Other | Attending: Internal Medicine | Admitting: Internal Medicine

## 2021-01-02 DIAGNOSIS — L97512 Non-pressure chronic ulcer of other part of right foot with fat layer exposed: Secondary | ICD-10-CM | POA: Diagnosis not present

## 2021-01-02 DIAGNOSIS — E11622 Type 2 diabetes mellitus with other skin ulcer: Secondary | ICD-10-CM | POA: Diagnosis not present

## 2021-01-02 DIAGNOSIS — Z89512 Acquired absence of left leg below knee: Secondary | ICD-10-CM | POA: Diagnosis not present

## 2021-01-02 DIAGNOSIS — L97218 Non-pressure chronic ulcer of right calf with other specified severity: Secondary | ICD-10-CM | POA: Diagnosis not present

## 2021-01-02 DIAGNOSIS — E1151 Type 2 diabetes mellitus with diabetic peripheral angiopathy without gangrene: Secondary | ICD-10-CM | POA: Diagnosis not present

## 2021-01-02 NOTE — Progress Notes (Addendum)
TIKEYA, VAUX ([id number]) Visit Report for 01/02/2021 Debridement Details Patient Name: Date of Service: [name] [facility] 01/02/2021 3:15 PM Medical Record Number: [id number] Patient Account Number: [id number] Date of Birth/Sex: Treating RN: [birth year] (45 y.o. [name] Primary Care Provider: [name] Other Clinician: Referring Provider: Treating Provider/Extender: Mammie Russian, Ivan Anchors in Treatment: 14 Debridement Performed for Assessment: Wound #5 Right,Posterior Lower Leg Performed By: Physician Ricard Dillon., MD Debridement Type: Debridement Severity of Tissue Pre Debridement: Fat layer exposed Level of Consciousness (Pre-procedure): Awake and Alert Pre-procedure Verification/Time Out Yes - 15:50 Taken: Start Time: 15:53 Pain Control: Other : benzocaine 20% spray T Area Debrided (L x W): otal 1.6 (cm) x 2.1 (cm) = 3.36 (cm) Tissue and other material debrided: Viable, Non-Viable, Slough, Subcutaneous, Slough Level: Skin/Subcutaneous Tissue Debridement Description: Excisional Instrument: Curette Bleeding: Minimum Hemostasis Achieved: Pressure End Time: 15:55 Procedural Pain: 5 Post Procedural Pain: 1 Response to Treatment: Procedure was tolerated well Level of Consciousness (Post- Awake and Alert procedure): Post Debridement Measurements of Total Wound Length: (cm) 1.6 Width: (cm) 2.1 Depth: (cm) 0.2 Volume: (cm) 0.528 Character of Wound/Ulcer Post Debridement: Improved Severity of Tissue Post Debridement: Fat layer exposed Post Procedure Diagnosis Same as Pre-procedure Electronic Signature(s) Signed: 01/02/2021 5:33:44 PM By: Linton Ham MD Signed: 01/02/2021 5:52:16 PM By: Baruch Gouty RN, BSN Entered By: Linton Ham on 01/02/2021 16:16:31 -------------------------------------------------------------------------------- HPI Details Patient Name: Date of Service: [name], [name] 01/02/2021 3:15 PM Medical Record  Number: [id number] Patient Account Number: [id number] Date of Birth/Sex: Treating RN: [birth year] (45 y.o. [name] Primary Care Provider: [name] Other Clinician: Referring Provider: Treating Provider/Extender: Mammie Russian, Ivan Anchors in Treatment: 14 History of Present Illness HPI Description: 05/30/19 patient presents today for initial evaluation our clinic concerning significant wounds that she has in the right gluteal region as well as in the left abdominal area. She also has a pressure injury to her trans metatarsal amputation site on the right which does not appear to be as looking at this time but nonetheless does need to close. This occurred while she was in the hospital. The patient was admitted to the hospital on 05/17/19 discharged on 05/25/19. This was due to the significant abscess in the right gluteal region as well as in the left panus region. She has a history of diabetes which is definitely not under good control of the most useful view of an agency being 17.5. She also has hypertension, COPD, and is morbidly obese. Unfortunately the wound and the right gluteal region extends right up and adjacent to the anus which is going to make the Wound VAC impossible at this point. Nonetheless I think that we're gonna have to basically pack this with wet to dry packing at this time in order to hopefully achieve improvement. Nonetheless this is a significant wound with her diabetes be not under control along with her size and again the nature of the wound I think this is gonna take a very long time to heal. 07/04/2019 upon evaluation today patient actually appears to be doing better in regard to her wounds. Her foot ulcer actually appears to be completely healed which is great the abdominal ulcer has filled in quite nicely and seems to be doing well although there is a lot of moisture I think that she would benefit from a silver alginate dressing. Nonetheless in  regard to the wound on her right gluteal region this showed signs of improvement today still this is a  fairly significant ulcer unfortunately. 08/01/2019 on evaluation patient presents for follow-up concerning her wounds over the right gluteal region as well as the left abdominal region. Fortunately both areas are showing signs of improvement which is good news. In fact significant improvement. Spent about a month since have seen her last. Overall I am extremely happy though with the findings of how the wounds have progressed at this point. READMISSION 09/22/2020 This is a now 45 year old woman previously in this clinic for a right gluteal abscess for 3 or 4 visits in 2020. She is a poorly controlled type II diabetic with the last hemoglobin A1c I see in epic at 12.7 in July 2021. She tells [place] that she is here for review of a wound on the tip of her right first metatarsal transmetatarsal amputation. She had an angiogram and angioplasty on 12/20/2018 with angioplasty of her anterior tibial and posterior tibial arteries. She has been followed by Dr. Doran Durand most recent treatment since August I think has been Santyl. She was given a 1 week course of Keflex in mid September. The patient's had an x-ray and also an MRI in August 2021 that did not show osteomyelitis but edema ABI in our clinic was 1.19 Past medical history includes hypertension, hyperlipidemia, COPD, bipolar disorder, poorly controlled type 2 diabetes with PAD. Left BKA, right transmetatarsal amputation, heart failure with preserved ejection fraction, she is minimally I am ambulatory walking only from her wheelchair to the bathroom with her walker. She only wears a stocking on her foot. Otherwise she is in wheelchair or in bed all day long. 11/1; patient readmitted to the clinic 2 weeks ago. She has a wound on the tip of the right first metatarsal transmetatarsal amputation site she has been using Santyl. She has Medicaid and we have to apply  for assistance if we are going to continue with Santyl although I am hopeful that we will be able to change from the [facility] next week. 11/9; area on the tip of the right first metatarsal amputation site. She has been using Santyl. She was in the hospital overnight for chest pain and they gave her another tube of Santyl which is nice since she has Medicaid and Medicaid would not pay for that. Her wound is smaller 12/2; tip of the right first metatarsal amputation site. She has been using Santyl. No changes wound dimension however I think the surface of the wound is better 12/28; patient has not been here in 3 and half weeks I think transportation issues. We gave her Hydrofera Blue last time however she switched back to [place] last week because somebody told her the wound was larger. The wound is actually measuring smaller and looks healthy. 1/13; since the patient was last here she was actually seen twice in the emergency department most recently on 12/30 this was for a painful small area on the right posterior calf superiorly. Previously she was seen in the ER on 12/19 for this problem but she did not tell us about this the last time she was here. There was some question of this being a spider bite based I think on somebody killing a spider in her apartment. When she was in the ER on 12/30 she complained of increasing pain and swelling. She had previously been given doxycycline but they treated with IV clindamycin transitioning to oral clindamycin. She is still using Hydrofera Blue to the area on the medial transmit amputation on the right this is not changed much today 1/27; 2-week follow-up.  The patient has Hydrofera Blue on the medial transmet amputation site. This looks better this week after debridement last week. She has a new area on the right posterior calf that we looked at last time she has been using Santyl to this that she had leftover from the days where Medicaid paid for  this. Electronic Signature(s) Signed: 01/02/2021 5:33:44 PM By: Linton Ham MD Entered By: Linton Ham on 01/02/2021 16:17:20 -------------------------------------------------------------------------------- Physical Exam Details Patient Name: Date of Service: [name] [facility] 01/02/2021 3:15 PM Medical Record Number: [id number] Patient Account Number: [id number] Date of Birth/Sex: Treating RN: 06-10-1976 (44 y.o. [name] Primary Care Provider: [name] Other Clinician: Referring Provider: Treating Provider/Extender: Mammie Russian, Ivan Anchors in Treatment: 60 Constitutional Patient is hypertensive.. Pulse regular and within target range for patient.Marland Kitchen Respirations regular, non-labored and within target range.. Temperature is normal and within the target range for the patient.. Cardiovascular Pedal pulses are palpable. Notes Wound exam; The area on the right first MTP transmetatarsal amputation site slightly smaller surface looks better no debridement done On the posterior right calf still a small punched out necrotic wound. Once again this requires debridement although I am able to get to a slightly better surface today. I am not really sure how this would have occurred. She thinks it might have been a Conservation officer, historic buildings) Signed: 01/02/2021 5:33:44 PM By: Linton Ham MD Entered By: Linton Ham on 01/02/2021 16:20:34 -------------------------------------------------------------------------------- Physician Orders Details Patient Name: Date of Service: [name], Belinda [facility] 01/02/2021 3:15 PM Medical Record Number: [id number] Patient Account Number: [id number] Date of Birth/Sex: Treating RN: 08-09-1976 (45 y.o. [name] Primary Care Provider: [name] Other Clinician: Referring Provider: Treating Provider/Extender: Mammie Russian, Ivan Anchors in Treatment: 14 Verbal / Phone Orders: No Diagnosis  Coding ICD-10 Coding Code Description E11.621 Type 2 diabetes mellitus with foot ulcer L97.512 Non-pressure chronic ulcer of other part of right foot with fat layer exposed Z89.512 Acquired absence of left leg below knee E11.51 Type 2 diabetes mellitus with diabetic peripheral angiopathy without gangrene L97.218 Non-pressure chronic ulcer of right calf with other specified severity Follow-up Appointments Return Appointment in 1 week. Bathing/ Shower/ Hygiene May shower with protection but do not get wound dressing(s) wet. - use a cast protector when not changing the dressing. May shower and wash wound with soap and water. - with dressing changes only. Edema Control - Lymphedema / SCD / Other Right Lower Extremity Elevate legs to the level of the heart or above for 30 minutes daily and/or when sitting, a frequency of: - 3-4 times throughout the day. Avoid standing for long periods of time. Moisturize legs daily. Off-Loading Other: - ensure no pressure to foot wound. Wound Treatment Wound #4 - Amputation Site - Transmetatarsal Wound Laterality: Right Cleanser: Normal Saline (Generic) Every Other Day/30 Days Discharge Instructions: Cleanse the wound with Normal Saline prior to applying a clean dressing using gauze sponges, not tissue or cotton balls. Cleanser: Soap and Water Every Other Day/30 Days Discharge Instructions: May shower and wash wound with dial antibacterial soap and water prior to dressing change. Prim Dressing: Hydrofera Blue Ready Foam, 4x5 in Every Other Day/30 Days ary Discharge Instructions: Apply to wound bed as instructed. moisten with saline. Secondary Dressing: Woven Gauze Sponge, Non-Sterile 4x4 in (Generic) Every Other Day/30 Days Discharge Instructions: Apply over primary dressing as directed. Secured With: [employer], 4.5x3.1 (in/yd) (Generic) Every Other Day/30 Days Discharge Instructions: Secure with Kerlix as directed. Secured With:  Paper Tape,  2x10 (in/yd) (Generic) Every Other Day/30 Days Discharge Instructions: Secure dressing with tape as directed. Wound #5 - Lower Leg Wound Laterality: Right, Posterior Cleanser: Soap and Water 1 x Per Day/30 Days Discharge Instructions: May shower and wash wound with dial antibacterial soap and water prior to dressing change. Cleanser: Wound Cleanser 1 x Per Day/30 Days Discharge Instructions: Cleanse the wound with wound cleanser prior to applying a clean dressing using gauze sponges, not tissue or cotton balls. Prim Dressing: IODOFLEX 0.9% Cadexomer Iodine Pad 4x6 cm 1 x Per Day/30 Days ary Discharge Instructions: Apply to wound bed as instructed Secondary Dressing: Woven Gauze Sponge, Non-Sterile 4x4 in 1 x Per Day/30 Days Discharge Instructions: Apply over primary dressing as directed. Secured With: 30M Medipore H Soft Cloth Surgical T 4 x 2 (in/yd) 1 x Per Day/30 Days ape Discharge Instructions: Secure dressing with tape as directed. Electronic Signature(s) Signed: 01/02/2021 5:33:44 PM By: Linton Ham MD Signed: 01/02/2021 5:52:16 PM By: Baruch Gouty RN, BSN Entered By: Baruch Gouty on 01/02/2021 15:58:03 -------------------------------------------------------------------------------- Problem List Details Patient Name: Date of Service: [name], Belinda [facility] 01/02/2021 3:15 PM Medical Record Number: [id number] Patient Account Number: [id number] Date of Birth/Sex: Treating RN: 1976-05-05 (45 y.o. 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 [name] Primary Care Provider: [name] Other Clinician: Referring Provider: Treating Provider/Extender: Mammie Russian, Ivan Anchors in Treatment: 14 Subjective History of Present Illness (HPI) 05/30/19 patient presents today for initial evaluation our clinic concerning significant wounds that she has in the right gluteal region as well as in the left abdominal area. She also has a pressure injury to her trans metatarsal amputation site on the right which does not appear to be as looking at this time but nonetheless does need to close. This occurred while she was in the hospital. The patient was admitted to the hospital on 05/17/19 discharged on 05/25/19. This was due to the significant abscess in the right gluteal region as well as in the left panus region. She has a history of diabetes which is definitely not under good control of the most useful view of an agency being 17.5. She also has hypertension, COPD, and is morbidly obese. Unfortunately the wound and the right gluteal region extends right up and adjacent to the anus which is going to make the Wound VAC impossible at this point. Nonetheless I think that we're gonna have to basically pack this with wet to dry packing at this time in order to hopefully achieve  improvement. Nonetheless this is a significant wound with her diabetes be not under control along with her size and again the nature  of the wound I think this is gonna take a very long time to heal. 07/04/2019 upon evaluation today patient actually appears to be doing better in regard to her wounds. Her foot ulcer actually appears to be completely healed which is great the abdominal ulcer has filled in quite nicely and seems to be doing well although there is a lot of moisture I think that she would benefit from a silver alginate dressing. Nonetheless in regard to the wound on her right gluteal region this showed signs of improvement today still this is a fairly significant ulcer unfortunately. 08/01/2019 on evaluation patient presents for follow-up concerning her wounds over the right gluteal region as well as the left abdominal region. Fortunately both areas are showing signs of improvement which is good news. In fact significant improvement. Spent about a month since have seen her last. Overall I am extremely happy though with the findings of how the wounds have progressed at this point. READMISSION 09/22/2020 This is a now 45 year old woman previously in this clinic for a right gluteal abscess for 3 or 4 visits in 2020. She is a poorly controlled type II diabetic with the last hemoglobin A1c I see in epic at 12.7 in July 2021. She tells [place] that she is here for review of a wound on the tip of her right first metatarsal transmetatarsal amputation. She had an angiogram and angioplasty on 12/20/2018 with angioplasty of her anterior tibial and posterior tibial arteries. She has been followed by Dr. Doran Durand most recent treatment since August I think has been Santyl. She was given a 1 week course of Keflex in mid September. The patient's had an x-ray and also an MRI in August 2021 that did not show osteomyelitis but edema ABI in our clinic was 1.19 Past medical history includes hypertension,  hyperlipidemia, COPD, bipolar disorder, poorly controlled type 2 diabetes with PAD. Left BKA, right transmetatarsal amputation, heart failure with preserved ejection fraction, she is minimally I am ambulatory walking only from her wheelchair to the bathroom with her walker. She only wears a stocking on her foot. Otherwise she is in wheelchair or in bed all day long. 11/1; patient readmitted to the clinic 2 weeks ago. She has a wound on the tip of the right first metatarsal transmetatarsal amputation site she has been using Santyl. She has Medicaid and we have to apply for assistance if we are going to continue with Santyl although I am hopeful that we will be able to change from the [facility] next week. 11/9; area on the tip of the right first metatarsal amputation site. She has been using Santyl. She was in the hospital overnight for chest pain and they gave her another tube of Santyl which is nice since she has Medicaid and Medicaid would not pay for that. Her wound is smaller 12/2; tip of the right first metatarsal amputation site. She has been using Santyl. No changes wound dimension however I think the surface of the wound is better 12/28; patient has not been here in 3 and half weeks I think transportation issues. We gave her Hydrofera Blue last time however she switched back to [place] last week because somebody told her the wound was larger. The wound is actually measuring smaller and looks healthy. 1/13; since the patient was last here she was actually seen twice in the emergency department most recently on 12/30 this was for a painful small area on the right posterior calf superiorly. Previously she was seen in the ER on  12/19 for this problem but she did not tell us about this the last time she was here. There was some question of this being a spider bite based I think on somebody killing a spider in her apartment. When she was in the ER on 12/30 she complained of increasing pain and swelling.  She had previously been given doxycycline but they treated with IV clindamycin transitioning to oral clindamycin. She is still using Hydrofera Blue to the area on the medial transmit amputation on the right this is not changed much today 1/27; 2-week follow-up. The patient has Hydrofera Blue on the medial transmet amputation site. This looks better this week after debridement last week. She has a new area on the right posterior calf that we looked at last time she has been using Santyl to this that she had leftover from the days where Medicaid paid for this. Objective Constitutional Patient is hypertensive.. Pulse regular and within target range for patient.Marland Kitchen Respirations regular, non-labored and within target range.. Temperature is normal and within the target range for the patient.. Vitals Time Taken: 3:28 PM, Height: 62 in, Weight: 362 lbs, BMI: 66.2, Temperature: 98.3 F, Pulse: 106 bpm, Respiratory Rate: 20 breaths/min, Blood Pressure: 181/97 mmHg. Cardiovascular Pedal pulses are palpable. General Notes: Wound exam; The area on the right first MTP transmetatarsal amputation site slightly smaller surface looks better no debridement done ooOn the posterior right calf still a small punched out necrotic wound. Once again this requires debridement although I am able to get to a slightly better surface today. I am not really sure how this would have occurred. She thinks it might have been a spider bite Integumentary (Hair, Skin) Wound #4 status is Open. Original cause of wound was Gradually Appeared. The date acquired was: 03/06/2020. The wound has been in treatment 14 weeks. 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 The wound measures 0.4cm length x 0.3cm width x 0.1cm depth; 0.094cm^2 area and 0.009cm^3 volume. There is Fat Layer (Subcutaneous Tissue) exposed. There is no tunneling or undermining noted. There is a medium amount of serosanguineous drainage noted. The  wound margin is distinct with the outline attached to the wound base. There is medium (34-66%) red granulation within the wound bed. There is a medium (34-66%) amount of necrotic tissue within the wound bed including Adherent Slough. Wound #5 status is Open. Original cause of wound was Not Known. The date acquired was: 11/05/2020. The wound has been in treatment 2 weeks. The wound is located on the Right,Posterior Lower Leg. The wound measures 1.6cm length x 2.1cm width x 0.2cm depth; 2.639cm^2 area and 0.528cm^3 volume. There is Fat Layer (Subcutaneous Tissue) exposed. There is no tunneling or undermining noted. There is a medium amount of serosanguineous drainage noted. The wound margin is flat and intact. There is small (1-33%) pink, pale granulation within the wound bed. There is a large (67-100%) amount of necrotic tissue within the wound bed including Adherent Slough. 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 There was a Excisional Skin/Subcutaneous Tissue Debridement with  a total area of 3.36 sq cm performed by Ricard Dillon., MD. With the following instrument(s): Curette to remove Viable and Non-Viable tissue/material. Material removed includes Subcutaneous Tissue and Slough and after achieving pain control using Other (benzocaine 20% spray). No specimens were taken. A time out was conducted at 15:50, prior to the start of the procedure. A Minimum amount of bleeding was controlled with  Pressure. The procedure was tolerated well with a pain level of 5 throughout and a pain level of 1 following the procedure. Post Debridement Measurements: 1.6cm length x 2.1cm width x 0.2cm depth; 0.528cm^3 volume. Character of Wound/Ulcer Post Debridement is improved. Severity of Tissue Post Debridement is: Fat layer exposed. Post procedure Diagnosis Wound #5: Same as Pre-Procedure Plan Follow-up Appointments: Return Appointment in 1 week. Bathing/ Shower/ Hygiene: May shower with protection but do not get wound dressing(s) wet. - use a cast protector when not changing the dressing. May shower and wash wound with soap and water. - with dressing changes only. Edema Control - Lymphedema / SCD / Other: Elevate legs to the level of the heart or above for 30 minutes daily and/or when sitting, a frequency of: - 3-4 times throughout the day. Avoid standing for long periods of time. Moisturize legs daily. Off-Loading: Other: - ensure no pressure to foot wound. WOUND #4: - Amputation Site - Transmetatarsal Wound Laterality: Right Cleanser: Normal Saline (Generic) Every Other Day/30 Days Discharge Instructions: Cleanse the wound with Normal Saline prior to applying a clean dressing using gauze sponges, not tissue or cotton balls. Cleanser: Soap and Water Every Other Day/30 Days Discharge Instructions: May shower and wash wound with dial antibacterial soap and water prior to dressing change. Prim Dressing: Hydrofera Blue Ready Foam, 4x5 in Every Other Day/30 Days ary Discharge Instructions: Apply to wound bed as instructed. moisten with saline. Secondary Dressing: Woven Gauze Sponge, Non-Sterile 4x4 in (Generic) Every Other Day/30 Days Discharge Instructions: Apply over primary dressing as directed. Secured With: [employer], 4.5x3.1 (in/yd) (Generic) Every Other Day/30 Days Discharge Instructions: Secure with Kerlix as directed. Secured With: Paper T ape, 2x10 (in/yd) (Generic) Every Other  Day/30 Days Discharge Instructions: Secure dressing with tape as directed. WOUND #5: - Lower Leg Wound Laterality: Right, Posterior Cleanser: Soap and Water 1 x Per Day/30 Days Discharge Instructions: May shower and wash wound with dial antibacterial soap and water prior to dressing change. Cleanser: Wound Cleanser 1 x Per Day/30 Days Discharge Instructions: Cleanse the wound with wound cleanser prior to applying a clean dressing using gauze sponges, not tissue or cotton balls. Prim Dressing: IODOFLEX 0.9% Cadexomer Iodine Pad 4x6 cm 1 x Per Day/30 Days ary Discharge Instructions: Apply to wound bed as instructed Secondary Dressing: Woven Gauze Sponge, Non-Sterile 4x4 in 1 x Per Day/30 Days Discharge Instructions: Apply over primary dressing as directed. Secured With: 20M Medipore H Soft Cloth Surgical T 4 x 2 (in/yd) 1 x Per Day/30 Days ape Discharge Instructions: Secure dressing with tape as directed. 1. Continue Hydrofera Blue to the transmetatarsal amputation site wound that actually looks better 2. Iodoflex to the small area on the right posterior calf. The exact cause of this is not really clear. Continue to monitor Electronic Signature(s) Signed: 06/29/2021 8:15:30 AM By: Linton Ham MD Entered By: Linton Ham on 04/06/2021 17:22:56 -------------------------------------------------------------------------------- SuperBill Details Patient Name: Date of Service: [name], Belinda [facility] 01/02/2021 Medical Record Number: [id number] Patient Account Number: [id number] Date of Birth/Sex: Treating RN: 03-12-1976 (45 y.o. 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 Facility Procedures CPT4 Code: [id number] Description: [id number] - DEB SUBQ TISSUE 20 SQ CM/< ICD-10 Diagnosis Description 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

## 2021-01-05 DIAGNOSIS — S31109A Unspecified open wound of abdominal wall, unspecified quadrant without penetration into peritoneal cavity, initial encounter: Secondary | ICD-10-CM | POA: Diagnosis not present

## 2021-01-05 NOTE — Progress Notes (Signed)
BRITTISH, BOLINGER ([id number]) Visit Report for 01/02/2021 Arrival Information Details Patient Name: Date of Service: [name] [facility] 01/02/2021 3:15 PM Medical Record Number: [id number] Patient Account Number: [id number] Date of Birth/Sex: Treating RN: 11-Jun-1976 (45 y.o. [name] Primary Care [name]: [name] Other Clinician: Referring [name]: Treating [name]/Extender: [name], [name] in Treatment: 14 Visit Information History Since Last Visit Added or deleted any medications: No Patient Arrived: Wheel Chair Any new allergies or adverse reactions: No Arrival Time: 15:28 Had a fall or experienced change in No Accompanied By: self activities of daily living that may affect Transfer Assistance: None risk of falls: Patient Identification Verified: Yes Signs or symptoms of abuse/neglect since last visito No Secondary Verification Process Completed: Yes Hospitalized since last visit: No Patient Requires Transmission-Based Precautions: No Implantable device outside of the clinic excluding No Patient Has Alerts: No cellular tissue based products placed in the center since last visit: Has Dressing in Place as Prescribed: Yes Pain Present Now: Yes Electronic Signature(s) Signed: 01/05/2021 11:41:01 AM By: [name] Entered By: [name] 15:28:43 -------------------------------------------------------------------------------- Encounter Discharge Information Details Patient Name: Date of Service: [name], TA [facility] 01/02/2021 3:15 PM Medical Record Number: [id number] Patient Account Number: [id number] Date of Birth/Sex: Treating RN: 1976/04/08 (45 y.o. [name] Primary Care [name]: [name] Other Clinician: Referring [name]: Treating [name]/Extender: [name], [name] in Treatment: 14 Encounter Discharge Information Items Post Procedure Vitals Discharge Condition:  Stable Temperature (F): 98.3 Ambulatory Status: Wheelchair Pulse (bpm): 106 Discharge Destination: Home Respiratory Rate (breaths/min): 20 Transportation: Private Auto Blood Pressure (mmHg): 181/97 Accompanied By: self Schedule Follow-up Appointment: Yes Clinical Summary of Care: Electronic Signature(s) Signed: 01/02/2021 5:42:38 PM By: [name] Entered By: [name] on Hall 16:41:00 -------------------------------------------------------------------------------- Lower Extremity Assessment Details Patient Name: Date of Service: [name] [facility] 01/02/2021 3:15 PM Medical Record Number: [id number] Patient Account Number: [id number] Date of Birth/Sex: Treating RN: July 27, 1976 (45 y.o. [name] Primary Care [name]: [name] Other Clinician: Referring Shanicqua Coldren: Treating Solace Manwarren/Extender: [name], [name] in Treatment: 14 Edema Assessment Assessed: [name]: No] [name]: Yes] Edema: [Left: Ye] [Right: s] Calf Left: Right: Point of Measurement: From Medial Instep 48 cm Ankle Left: Right: Point of Measurement: From Medial Instep 26 cm Vascular Assessment Pulses: Dorsalis Pedis Palpable: [Right:Yes] Electronic Signature(s) Signed: 01/02/2021 5:42:38 PM By: [name] Entered By: [name] on Hall 15:45:38 -------------------------------------------------------------------------------- Multi Wound Chart Details Patient Name: Date of Service: [name], TA [name] 01/02/2021 3:15 PM Medical Record Number: [id number] Patient Account Number: [id number] Date of Birth/Sex: Treating RN: [birth year] (45 y.o. [name] Primary Care Demetri Kerman: [name] Other Clinician: Referring Clemence Stillings: Treating Gabryel Files/Extender: [name], [name] in Treatment: 14 Vital Signs Height(in): 64 Pulse(bpm): 106 Weight(lbs): 106 Blood Pressure(mmHg): 181/97 Body Mass Index(BMI): 66 Temperature(F):  98.3 Respiratory Rate(breaths/min): 20 Photos: [4:No Photos Right Amputation Site -] [5:No Photos Right, Posterior Lower Leg] [N/A:N/A N/A] Wound Location: [4:Transmetatarsal Gradually Appeared] [5:Not Known] [N/A:N/A] Wounding Event: [4:Diabetic Wound/Ulcer of the Lower] [5:Diabetic Wound/Ulcer of the Lower] [N/A:N/A] Primary Etiology: [4:Extremity Asthma, Chronic Obstructive] [5:Extremity Asthma, Chronic Obstructive] [N/A:N/A] Comorbid History: [4:Pulmonary Disease (COPD), Sleep Apnea, Congestive Heart Failure, Hypertension, Peripheral Venous Disease, Type II Diabetes, Osteoarthritis, Osteomyelitis, Neuropathy 03/06/2020] [5:Pulmonary Disease (COPD), Sleep Apnea, Congestive  Heart Failure, Hypertension, Peripheral Venous Disease, Type II Diabetes, Osteoarthritis, Osteomyelitis, Neuropathy 11/05/2020] [N/A:N/A] Date Acquired: [4:14] [5:2] [N/A:N/A] Weeks of Treatment: [4:Open] [5:Open] [N/A:N/A] Wound Status: [4:0.4x0.3x0.1] [5:1.6x2.1x0.2] [N/A:N/A] Measurements L x  W x D (cm) [4:0.094] [5:2.639] [N/A:N/A] A (cm) : rea [4:0.009] [5:0.528] [N/A:N/A] Volume (cm) : [4:89.10%] [5:-16.70%] [N/A:N/A] % Reduction in A [4:rea: 96.50%] [5:-133.60%] [N/A:N/A] % Reduction in Volume: [4:Grade 2] [5:Grade 2] [N/A:N/A] Classification: [4:Medium] [5:Medium] [N/A:N/A] Exudate A mount: [4:Serosanguineous] [5:Serosanguineous] [N/A:N/A] Exudate Type: [4:red, brown] [5:red, brown] [N/A:N/A] Exudate Color: [4:Distinct, outline attached] [5:Flat and Intact] [N/A:N/A] Wound Margin: [4:Medium (34-66%)] [5:Small (1-33%)] [N/A:N/A] Granulation A mount: [4:Red] [5:Pink, Pale] [N/A:N/A] Granulation Quality: [4:Medium (34-66%)] [5:Large (67-100%)] [N/A:N/A] Necrotic A mount: [4:Fat Layer (Subcutaneous Tissue): Yes Fat Layer (Subcutaneous Tissue): Yes N/A] Exposed Structures: [4:Fascia: No Tendon: No Muscle: No Joint: No Bone: No Medium (34-66%)] [5:Fascia: No Tendon: No Muscle: No Joint: No Bone: No Small (1-33%)]  [N/A:N/A] Epithelialization: [4:N/A] [5:Debridement - Excisional] [N/A:N/A] Debridement: Pre-procedure Verification/Time Out N/A [5:15:50] [N/A:N/A] Taken: [4:N/A] [5:Other] [N/A:N/A] Pain Control: [4:N/A] [5:Subcutaneous, Slough] [N/A:N/A] Tissue Debrided: [4:N/A] [5:Skin/Subcutaneous Tissue] [N/A:N/A] Level: [4:N/A] [5:3.36] [N/A:N/A] Debridement A (sq cm): [4:rea N/A] [5:Curette] [N/A:N/A] Instrument: [4:N/A] [5:Minimum] [N/A:N/A] Bleeding: [4:N/A] [5:Pressure] [N/A:N/A] Hemostasis A chieved: [4:N/A] [5:5] [N/A:N/A] Procedural Pain: [4:N/A] [5:1] [N/A:N/A] Post Procedural Pain: [4:N/A] [5:Procedure was tolerated well] [N/A:N/A] Debridement Treatment Response: [4:N/A] [5:1.6x2.1x0.2] [N/A:N/A] Post Debridement Measurements L x W x D (cm) [4:N/A] [5:0.528] [N/A:N/A] Post Debridement Volume: (cm) [4:N/A] [5:Debridement] [N/A:N/A] Treatment Notes Electronic Signature(s) Signed: 01/02/2021 5:33:44 PM By: Linton Ham MD Signed: 01/02/2021 5:52:16 PM By: Baruch Gouty RN, BSN Entered By: Linton Ham on Hall 16:16:18 -------------------------------------------------------------------------------- Multi-Disciplinary Care Plan Details Patient Name: Date of Service: [name], Earley [facility] 01/02/2021 3:15 PM Medical Record Number: [id number] Patient Account Number: [id number] Date of Birth/Sex: Treating RN: 11/17/1976 (45 y.o. [name] Primary Care Jaclynne Baldo: [name] Other Clinician: Referring Girtrude Enslin: Treating Eriberto Felch/Extender: [name], [name] in Treatment: 14 Active Inactive Abuse / Safety / Falls / Self Care Management Nursing Diagnoses: History of Falls Potential for injury related to falls Goals: Patient will not experience any injury related to falls Date Initiated: 09/22/2020 Target Resolution Date: 02/06/2021 Goal Status: Active Patient/caregiver will verbalize/demonstrate measures taken to prevent injury and/or  falls Date Initiated: 09/22/2020 Target Resolution Date: 02/06/2021 Goal Status: Active Interventions: Assess Activities of Daily Living upon admission and as needed Assess fall risk on admission and as needed Assess: immobility, friction, shearing, incontinence upon admission and as needed Assess impairment of mobility on admission and as needed per policy Assess personal safety and home safety (as indicated) on admission and as needed Assess self care needs on admission and as needed Provide education on fall prevention Notes: Nutrition Nursing Diagnoses: Impaired glucose control: actual or potential Potential for alteratiion in Nutrition/Potential for imbalanced nutrition Goals: Patient/caregiver agrees to and verbalizes understanding of need to use nutritional supplements and/or vitamins as prescribed Date Initiated: 09/22/2020 Date Inactivated: 11/06/2020 Target Resolution Date: 11/06/2020 Goal Status: Met Patient/caregiver will maintain therapeutic glucose control Date Initiated: 09/22/2020 Target Resolution Date: 02/06/2021 Goal Status: Active Interventions: Assess HgA1c results as ordered upon admission and as needed Assess patient nutrition upon admission and as needed per policy Provide education on elevated blood sugars and impact on wound healing Provide education on nutrition Treatment Activities: Education provided on Nutrition : 10/06/2020 Notes: Electronic Signature(s) Signed: 01/02/2021 5:52:16 PM By: Baruch Gouty RN, BSN Entered By: Baruch Gouty on Hall 15:51:15 -------------------------------------------------------------------------------- Pain Assessment Details Patient Name: Date of Service: [name], TA [facility] 01/02/2021 3:15 PM Medical Record Number: [id number] Patient Account Number: [id number] Date of Birth/Sex: Treating RN: [birth year] (45 y.o. [name] Primary Care Dontrey Snellgrove: [name]  Other Clinician: Referring  Elara Cocke: Treating Gamal Todisco/Extender: [name], [name] in Treatment: 14 Active Problems Location of Pain Severity and Description of Pain Patient Has Paino Yes Site Locations Rate the pain. Rate the pain. Current Pain Level: 7 Pain Management and Medication Current Pain Management: Electronic Signature(s) Signed: 01/02/2021 5:52:16 PM By: Baruch Gouty RN, BSN Signed: 01/05/2021 11:41:01 AM By: [name] Entered By: [name] 15:29:25 -------------------------------------------------------------------------------- Patient/Caregiver Education Details Patient Name: Date of Service: [name] [facility] 1/28/2022andnbsp3:15 PM Medical Record Number: [id number] Patient Account Number: [id number] Date of Birth/Gender: Treating RN: [birth year] (45 y.o. [name] Primary Care Physician: [name] Other Clinician: Referring Physician: Treating Physician/Extender: [name], [name] in Treatment: 14 Education Assessment Education Provided To: Patient Education Topics Provided Venous: Methods: Explain/Verbal Responses: Reinforcements needed, State content correctly Wound/Skin Impairment: Methods: Explain/Verbal Responses: Reinforcements needed, State content correctly Electronic Signature(s) Signed: 01/02/2021 5:52:16 PM By: Baruch Gouty RN, BSN Entered By: Baruch Gouty on Hall 15:52:12 -------------------------------------------------------------------------------- Wound Assessment Details Patient Name: Date of Service: [name] [facility] 01/02/2021 3:15 PM Medical Record Number: [id number] Patient Account Number: [id number] Date of Birth/Sex: Treating RN: 29-Jun-1976 (45 y.o. [name] Primary Care Alaylah Heatherington: [name] Other Clinician: Referring Suzetta Timko: Treating Satoya Feeley/Extender: [name], [name] in Treatment: 14 Wound Status Wound  Number: 4 Primary Diabetic Wound/Ulcer of the Lower Extremity Etiology: Wound Location: Right Amputation Site - Transmetatarsal Wound Open Wounding Event: Gradually Appeared Status: Date Acquired: 03/06/2020 Comorbid Asthma, Chronic Obstructive Pulmonary Disease (COPD), Sleep Weeks Of Treatment: 14 History: Apnea, Congestive Heart Failure, Hypertension, Peripheral Venous Clustered Wound: No Disease, Type II Diabetes, Osteoarthritis, Osteomyelitis, Neuropathy Wound Measurements Length: (cm) 0.4 Width: (cm) 0.3 Depth: (cm) 0.1 Area: (cm) 0.094 Volume: (cm) 0.009 % Reduction in Area: 89.1% % Reduction in Volume: 96.5% Epithelialization: Medium (34-66%) Tunneling: No Undermining: No Wound Description Classification: Grade 2 Wound Margin: Distinct, outline attached Exudate Amount: Medium Exudate Type: Serosanguineous Exudate Color: red, brown Foul Odor After Cleansing: No Slough/Fibrino Yes Wound Bed Granulation Amount: Medium (34-66%) Exposed Structure Granulation Quality: Red Fascia Exposed: No Necrotic Amount: Medium (34-66%) Fat Layer (Subcutaneous Tissue) Exposed: Yes Necrotic Quality: Adherent Slough Tendon Exposed: No Muscle Exposed: No Joint Exposed: No Bone Exposed: No Treatment Notes Wound #4 (Amputation Site - Transmetatarsal) Wound Laterality: Right Cleanser Normal Saline Discharge Instruction: Cleanse the wound with Normal Saline prior to applying a clean dressing using gauze sponges, not tissue or cotton balls. Soap and Water Discharge Instruction: May shower and wash wound with dial antibacterial soap and water prior to dressing change. Peri-Wound Care Topical Primary Dressing Hydrofera Blue Ready Foam, 4x5 in Discharge Instruction: Apply to wound bed as instructed. moisten with saline. Secondary Dressing Woven Gauze Sponge, Non-Sterile 4x4 in Discharge Instruction: Apply over primary dressing as directed. Secured With [employer], 4.5x3.1  (in/yd) Discharge Instruction: Secure with Kerlix as directed. Paper Tape, 2x10 (in/yd) Discharge Instruction: Secure dressing with tape as directed. Compression Wrap Compression Stockings Add-Ons Electronic Signature(s) Signed: 01/02/2021 5:42:38 PM By: [name] Signed: 01/02/2021 5:52:16 PM By: Baruch Gouty RN, BSN Entered By: [name] on Hall 15:46:29 -------------------------------------------------------------------------------- Wound Assessment Details Patient Name: Date of Service: [name], TA [facility] 01/02/2021 3:15 PM Medical Record Number: [id number] Patient Account Number: [id number] Date of Birth/Sex: Treating RN: March 13, 1976 (45 y.o. [name] Primary Care Likisha Alles: [name] Other Clinician: Referring Younes Degeorge: Treating Pearl Bents/Extender: [name], [name] in Treatment: 14 Wound Status Wound Number: 5 Primary Diabetic  Wound/Ulcer of the Lower Extremity Etiology: Wound Location: Right, Posterior Lower Leg Wound Open Wounding Event: Not Known Status: Date Acquired: 11/05/2020 Comorbid Asthma, Chronic Obstructive Pulmonary Disease (COPD), Sleep Weeks Of Treatment: 2 History: Apnea, Congestive Heart Failure, Hypertension, Peripheral Venous Clustered Wound: No Disease, Type II Diabetes, Osteoarthritis, Osteomyelitis, Neuropathy Wound Measurements Length: (cm) 1.6 Width: (cm) 2.1 Depth: (cm) 0.2 Area: (cm) 2.639 Volume: (cm) 0.528 % Reduction in Area: -16.7% % Reduction in Volume: -133.6% Epithelialization: Small (1-33%) Tunneling: No Undermining: No Wound Description Classification: Grade 2 Wound Margin: Flat and Intact Exudate Amount: Medium Exudate Type: Serosanguineous Exudate Color: red, brown Foul Odor After Cleansing: No Slough/Fibrino Yes Wound Bed Granulation Amount: Small (1-33%) Exposed Structure Granulation Quality: Pink, Pale Fascia Exposed: No Necrotic Amount: Large (67-100%) Fat  Layer (Subcutaneous Tissue) Exposed: Yes Necrotic Quality: Adherent Slough Tendon Exposed: No Muscle Exposed: No Joint Exposed: No Bone Exposed: No Treatment Notes Wound #5 (Lower Leg) Wound Laterality: Right, Posterior Cleanser Soap and Water Discharge Instruction: May shower and wash wound with dial antibacterial soap and water prior to dressing change. Wound Cleanser Discharge Instruction: Cleanse the wound with wound cleanser prior to applying a clean dressing using gauze sponges, not tissue or cotton balls. Peri-Wound Care Topical Primary Dressing IODOFLEX 0.9% Cadexomer Iodine Pad 4x6 cm Discharge Instruction: Apply to wound bed as instructed Secondary Dressing Woven Gauze Sponge, Non-Sterile 4x4 in Discharge Instruction: Apply over primary dressing as directed. Secured With 47M Medipore H Soft Cloth Surgical T 4 x 2 (in/yd) ape Discharge Instruction: Secure dressing with tape as directed. Compression Wrap Compression Stockings Add-Ons Electronic Signature(s) Signed: 01/02/2021 5:42:38 PM By: [name] Signed: 01/02/2021 5:52:16 PM By: Baruch Gouty RN, BSN Entered By: [name] on Hall 15:48:08 -------------------------------------------------------------------------------- Vitals Details Patient Name: Date of Service: [name], [name] 01/02/2021 3:15 PM Medical Record Number: [id number] Patient Account Number: [id number] Date of Birth/Sex: Treating RN: 1976-10-19 (46 y.o. [name] Primary Care Kameela Leipold: [name] Other Clinician: Referring Frisco Cordts: Treating Antonela Freiman/Extender: [name], [name] in Treatment: 14 Vital Signs Time Taken: 15:28 Temperature (F): 98.3 Height (in): 62 Pulse (bpm): 106 Weight (lbs): 362 Respiratory Rate (breaths/min): 20 Body Mass Index (BMI): 66.2 Blood Pressure (mmHg): 181/97 Reference Range: 80 - 120 mg / dl Electronic Signature(s) Signed: 01/05/2021 11:41:01 AM By:  [name] Entered By: [name] 15:29:17

## 2021-01-06 DIAGNOSIS — M86172 Other acute osteomyelitis, left ankle and foot: Secondary | ICD-10-CM | POA: Diagnosis not present

## 2021-01-06 DIAGNOSIS — J45909 Unspecified asthma, uncomplicated: Secondary | ICD-10-CM | POA: Diagnosis not present

## 2021-01-06 DIAGNOSIS — G4733 Obstructive sleep apnea (adult) (pediatric): Secondary | ICD-10-CM | POA: Diagnosis not present

## 2021-01-06 DIAGNOSIS — R32 Unspecified urinary incontinence: Secondary | ICD-10-CM | POA: Diagnosis not present

## 2021-01-06 DIAGNOSIS — E669 Obesity, unspecified: Secondary | ICD-10-CM | POA: Diagnosis not present

## 2021-01-08 ENCOUNTER — Other Ambulatory Visit: Payer: Self-pay | Admitting: Pharmacist

## 2021-01-08 ENCOUNTER — Encounter (HOSPITAL_BASED_OUTPATIENT_CLINIC_OR_DEPARTMENT_OTHER): Payer: Medicaid Other | Attending: Internal Medicine | Admitting: Internal Medicine

## 2021-01-08 ENCOUNTER — Other Ambulatory Visit: Payer: Self-pay

## 2021-01-08 ENCOUNTER — Other Ambulatory Visit: Payer: Self-pay | Admitting: Family Medicine

## 2021-01-08 DIAGNOSIS — I110 Hypertensive heart disease with heart failure: Secondary | ICD-10-CM | POA: Diagnosis not present

## 2021-01-08 DIAGNOSIS — I5030 Unspecified diastolic (congestive) heart failure: Secondary | ICD-10-CM | POA: Diagnosis not present

## 2021-01-08 DIAGNOSIS — Z89512 Acquired absence of left leg below knee: Secondary | ICD-10-CM | POA: Insufficient documentation

## 2021-01-08 DIAGNOSIS — E11621 Type 2 diabetes mellitus with foot ulcer: Secondary | ICD-10-CM | POA: Diagnosis not present

## 2021-01-08 DIAGNOSIS — L97512 Non-pressure chronic ulcer of other part of right foot with fat layer exposed: Secondary | ICD-10-CM | POA: Insufficient documentation

## 2021-01-08 DIAGNOSIS — E1151 Type 2 diabetes mellitus with diabetic peripheral angiopathy without gangrene: Secondary | ICD-10-CM | POA: Insufficient documentation

## 2021-01-08 DIAGNOSIS — E1140 Type 2 diabetes mellitus with diabetic neuropathy, unspecified: Secondary | ICD-10-CM | POA: Diagnosis not present

## 2021-01-08 NOTE — Progress Notes (Signed)
[name], [name] ([id number]) Visit Report for 01/08/2021 Debridement Details Patient Name: Date of Service: [name] [facility] 01/08/2021 10:30 A M Medical Record Number: [id number] Patient Account Number: [id number] Date of Birth/Sex: Treating RN: 01-02-1976 (45 y.o. [name], [name] Primary Care Provider: [name] Other Clinician: Referring Provider: Treating Provider/Extender: Mammie Russian, Ivan Anchors in Treatment: 15 Debridement Performed for Assessment: Wound #5 Right,Posterior Lower Leg Performed By: Clinician Baruch Gouty, RN Debridement Type: Chemical/Enzymatic/Mechanical Agent Used: Santyl Severity of Tissue Pre Debridement: Fat layer exposed Level of Consciousness (Pre-procedure): Awake and Alert Pre-procedure Verification/Time Out No Taken: Bleeding: None Response to Treatment: Procedure was tolerated well Level of Consciousness (Post- Awake and Alert procedure): Post Debridement Measurements of Total Wound Length: (cm) 1.7 Width: (cm) 2 Depth: (cm) 0.1 Volume: (cm) 0.267 Character of Wound/Ulcer Post Debridement: Requires Further Debridement Severity of Tissue Post Debridement: Fat layer exposed Post Procedure Diagnosis Same as Pre-procedure Electronic Signature(s) Signed: 01/08/2021 4:57:15 PM By: Linton Ham MD Signed: 01/08/2021 5:30:16 PM By: Deon Pilling Entered By: Deon Pilling on 01/08/2021 10:53:17 -------------------------------------------------------------------------------- Debridement Details Patient Name: Date of Service: [name], [name] 01/08/2021 10:30 A M Medical Record Number: [id number] Patient Account Number: [id number] Date of Birth/Sex: Treating RN: 06-10-1976 (45 y.o. [name], [name] Primary Care Provider: [name] Other Clinician: Referring Provider: Treating Provider/Extender: Mammie Russian, Ivan Anchors in Treatment: 15 Debridement Performed for Assessment: Wound #4 Right Amputation Site -  Transmetatarsal Performed By: Physician Ricard Dillon., MD Debridement Type: Debridement Severity of Tissue Pre Debridement: Fat layer exposed Level of Consciousness (Pre-procedure): Awake and Alert Pre-procedure Verification/Time Out Yes - 10:45 Taken: Start Time: 10:46 Pain Control: Lidocaine 4% T opical Solution T Area Debrided (L x W): otal 0.5 (cm) x 0.5 (cm) = 0.25 (cm) Tissue and other material debrided: Viable, Non-Viable, Subcutaneous, Skin: Dermis , Skin: Epidermis, Fibrin/Exudate Level: Skin/Subcutaneous Tissue Debridement Description: Excisional Instrument: Curette Bleeding: Minimum Hemostasis Achieved: Pressure End Time: 10:50 Procedural Pain: 0 Post Procedural Pain: 0 Response to Treatment: Procedure was tolerated well Level of Consciousness (Post- Awake and Alert procedure): Post Debridement Measurements of Total Wound Length: (cm) 0.3 Width: (cm) 0.3 Depth: (cm) 0.1 Volume: (cm) 0.007 Character of Wound/Ulcer Post Debridement: Improved Severity of Tissue Post Debridement: Fat layer exposed Post Procedure Diagnosis Same as Pre-procedure Electronic Signature(s) Signed: 01/08/2021 4:57:15 PM By: Linton Ham MD Signed: 01/08/2021 5:30:16 PM By: Deon Pilling Entered By: Linton Ham on 01/08/2021 10:57:23 -------------------------------------------------------------------------------- HPI Details Patient Name: Date of Service: [name], [name] 01/08/2021 10:30 A M Medical Record Number: [id number] Patient Account Number: [id number] Date of Birth/Sex: Treating RN: 08/21/1976 (45 y.o. [name] Primary Care Provider: [name] Other Clinician: Referring Provider: Treating Provider/Extender: Mammie Russian, Ivan Anchors in Treatment: 15 History of Present Illness HPI Description: 05/30/19 patient presents today for initial evaluation our clinic concerning significant wounds that she has in the right gluteal region as well as in  the left abdominal area. She also has a pressure injury to her trans metatarsal amputation site on the right which does not appear to be as looking at this time but nonetheless does need to close. This occurred while she was in the hospital. The patient was admitted to the hospital on 05/17/19 discharged on 05/25/19. This was due to the significant abscess in the right gluteal region as well as in the left panus region. She has a history of diabetes which is definitely not under good control of the most useful view of an  agency being 17.5. She also has hypertension, COPD, and is morbidly obese. Unfortunately the wound and the right gluteal region extends right up and adjacent to the anus which is going to make the Wound VAC impossible at this point. Nonetheless I think that we're gonna have to basically pack this with wet to dry packing at this time in order to hopefully achieve improvement. Nonetheless this is a significant wound with her diabetes be not under control along with her size and again the nature of the wound I think this is gonna take a very long time to heal. 07/04/2019 upon evaluation today patient actually appears to be doing better in regard to her wounds. Her foot ulcer actually appears to be completely healed which is great the abdominal ulcer has filled in quite nicely and seems to be doing well although there is a lot of moisture I think that she would benefit from a silver alginate dressing. Nonetheless in regard to the wound on her right gluteal region this showed signs of improvement today still this is a fairly significant ulcer unfortunately. 08/01/2019 on evaluation patient presents for follow-up concerning her wounds over the right gluteal region as well as the left abdominal region. Fortunately both areas are showing signs of improvement which is good news. In fact significant improvement. Spent about a month since have seen her last. Overall I am extremely happy though with  the findings of how the wounds have progressed at this point. READMISSION 09/22/2020 This is a now 44 year old woman previously in this clinic for a right gluteal abscess for 3 or 4 visits in 2020. She is a poorly controlled type II diabetic with the last hemoglobin A1c I see in epic at 12.7 in July 2021. She tells [place] that she is here for review of a wound on the tip of her right first metatarsal transmetatarsal amputation. She had an angiogram and angioplasty on 12/20/2018 with angioplasty of her anterior tibial and posterior tibial arteries. She has been followed by Dr. Doran Durand most recent treatment since August I think has been Santyl. She was given a 1 week course of Keflex in mid September. The patient's had an x-ray and also an MRI in August 2021 that did not show osteomyelitis but edema ABI in our clinic was 1.19 Past medical history includes hypertension, hyperlipidemia, COPD, bipolar disorder, poorly controlled type 2 diabetes with PAD. Left BKA, right transmetatarsal amputation, heart failure with preserved ejection fraction, she is minimally I am ambulatory walking only from her wheelchair to the bathroom with her walker. She only wears a stocking on her foot. Otherwise she is in wheelchair or in bed all day long. 11/1; patient readmitted to the clinic 2 weeks ago. She has a wound on the tip of the right first metatarsal transmetatarsal amputation site she has been using Santyl. She has Medicaid and we have to apply for assistance if we are going to continue with Santyl although I am hopeful that we will be able to change from the [facility] next week. 11/9; area on the tip of the right first metatarsal amputation site. She has been using Santyl. She was in the hospital overnight for chest pain and they gave her another tube of Santyl which is nice since she has Medicaid and Medicaid would not pay for that. Her wound is smaller 12/2; tip of the right first metatarsal amputation site. She has  been using Santyl. No changes wound dimension however I think the surface of the wound is better 12/28;  patient has not been here in 3 and half weeks I think transportation issues. We gave her Hydrofera Blue last time however she switched back to [place] last week because somebody told her the wound was larger. The wound is actually measuring smaller and looks healthy. 1/13; since the patient was last here she was actually seen twice in the emergency department most recently on 12/30 this was for a painful small area on the right posterior calf superiorly. Previously she was seen in the ER on 12/19 for this problem but she did not tell us about this the last time she was here. There was some question of this being a spider bite based I think on somebody killing a spider in her apartment. When she was in the ER on 12/30 she complained of increasing pain and swelling. She had previously been given doxycycline but they treated with IV clindamycin transitioning to oral clindamycin. She is still using Hydrofera Blue to the area on the medial transmit amputation on the right this is not changed much today 1/27; 2-week follow-up. The patient has Hydrofera Blue on the medial transmet amputation site. This looks better this week after debridement last week. She has a new area on the right posterior calf that we looked at last time she has been using Santyl to this that she had leftover from the days where Medicaid paid for this. 2/3; she did not tolerate the Iodoflex well on the back of her leg although the surface of it looks a lot better today. She was approved for Santyl to this area through compassionate release. We are using Hydrofera Blue on the transmet wound on the first met head Electronic Signature(s) Signed: 01/08/2021 4:57:15 PM By: Linton Ham MD Entered By: Linton Ham on 01/08/2021 10:58:47 -------------------------------------------------------------------------------- Physical Exam  Details Patient Name: Date of Service: [name], [name] 01/08/2021 10:30 A M Medical Record Number: [id number] Patient Account Number: [id number] Date of Birth/Sex: Treating RN: 10/28/1976 (45 y.o. [name] Primary Care Provider: [name] Other Clinician: Referring Provider: Treating Provider/Extender: Mammie Russian, Ivan Anchors in Treatment: 15 Constitutional Patient is hypertensive.. Pulse regular and within target range for patient.Marland Kitchen Respirations regular, non-labored and within target range.. Temperature is normal and within the target range for the patient.Marland Kitchen Appears in no distress. Cardiovascular Palpable pulses on the right. Notes Wound exam; the area on the right first MTP this is a small wound but his usual some built-up callus flaking skin around the circumference and debris on the wound surface I used a #3 curette to aggressively clean the soft hemostasis with direct pressure. I removed subcutaneous tissue. On the right posterior calf the wound I aggressively debrided last week actually looks a lot better there is still some debris around the circumference. I am going to use Santyl for a week and see if I can clean this off next week with a mechanical debridement Electronic Signature(s) Signed: 01/08/2021 4:57:15 PM By: Linton Ham MD Entered By: Linton Ham on 01/08/2021 11:01:25 -------------------------------------------------------------------------------- Physician Orders Details Patient Name: Date of Service: [name], [name] 01/08/2021 10:30 A M Medical Record Number: [id number] Patient Account Number: [id number] Date of Birth/Sex: Treating RN: [birth year] (45 y.o. [name] Primary Care Provider: [name] Other Clinician: Referring Provider: Treating Provider/Extender: Mammie Russian, Ivan Anchors in Treatment: 15 Verbal / Phone Orders: No Diagnosis Coding Follow-up Appointments Return Appointment in 1  week. Bathing/ Shower/ Hygiene May shower with protection but do not get wound dressing(s) wet. -  use a cast protector when not changing the dressing. May shower and wash wound with soap and water. - with dressing changes only. Edema Control - Lymphedema / SCD / Other Right Lower Extremity Elevate legs to the level of the heart or above for 30 minutes daily and/or when sitting, a frequency of: - 3-4 times throughout the day. Avoid standing for long periods of time. Exercise regularly - may wear diabetic shoes now, may restart physical therapy, may start walking. Just ensure the filler in the diabetic Hall does not rub on the wound area. Moisturize legs daily. Off-Loading Other: - ensure no pressure to foot wound. Wound Treatment Wound #4 - Amputation Site - Transmetatarsal Wound Laterality: Right Cleanser: Normal Saline (Generic) Every Other Day/30 Days Discharge Instructions: Cleanse the wound with Normal Saline prior to applying a clean dressing using gauze sponges, not tissue or cotton balls. Cleanser: Soap and Water Every Other Day/30 Days Discharge Instructions: May shower and wash wound with dial antibacterial soap and water prior to dressing change. Prim Dressing: Hydrofera Blue Ready Foam, 4x5 in Every Other Day/30 Days ary Discharge Instructions: Apply to wound bed as instructed. moisten with saline. Secondary Dressing: Woven Gauze Sponge, Non-Sterile 4x4 in (Generic) Every Other Day/30 Days Discharge Instructions: Apply over primary dressing as directed. Secured With: [employer], 4.5x3.1 (in/yd) (Generic) Every Other Day/30 Days Discharge Instructions: Secure with Kerlix as directed. Secured With: Paper Tape, 2x10 (in/yd) (Generic) Every Other Day/30 Days Discharge Instructions: Secure dressing with tape as directed. Wound #5 - Lower Leg Wound Laterality: Right, Posterior Cleanser: Soap and Water 1 x Per Day/30 Days Discharge Instructions: May shower and wash wound  with dial antibacterial soap and water prior to dressing change. Cleanser: Wound Cleanser 1 x Per Day/30 Days Discharge Instructions: Cleanse the wound with wound cleanser prior to applying a clean dressing using gauze sponges, not tissue or cotton balls. Prim Dressing: Santyl Ointment 1 x Per Day/30 Days ary Discharge Instructions: Apply nickel thick amount to wound bed as instructed Secondary Dressing: Woven Gauze Sponge, Non-Sterile 4x4 in 1 x Per Day/30 Days Discharge Instructions: Apply over primary dressing as directed. Secured With: 71M Medipore H Soft Cloth Surgical T 4 x 2 (in/yd) 1 x Per Day/30 Days ape Discharge Instructions: Secure dressing with tape as directed. Electronic Signature(s) Signed: 01/08/2021 4:57:15 PM By: Linton Ham MD Signed: 01/08/2021 5:30:16 PM By: Deon Pilling Entered By: Deon Pilling on 01/08/2021 10:54:45 -------------------------------------------------------------------------------- Problem List Details Patient Name: Date of Service: [name], TA [facility] 01/08/2021 10:30 A M Medical Record Number: [id number] Patient Account Number: [id number] Date of Birth/Sex: Treating RN: June 15, 1976 (45 y.o. [name], [name] Primary Care Provider: [name] Other Clinician: Referring Provider: Treating Provider/Extender: Mammie Russian, Ivan Anchors in Treatment: 15 Active Problems ICD-10 Encounter Code Description Active Date MDM Diagnosis E11.621 Type 2 diabetes mellitus with foot ulcer 09/22/2020 No Yes L97.512 Non-pressure chronic ulcer of other part of right foot with fat layer exposed 09/22/2020 No Yes Z89.512 Acquired absence of left leg below knee 09/22/2020 No Yes E11.51 Type 2 diabetes mellitus with diabetic peripheral angiopathy without gangrene 09/22/2020 No Yes L97.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 [name] Primary Care Provider: [name] Other Clinician: Referring Provider: Treating Provider/Extender: Mammie Russian, Ivan Anchors in Treatment: 15 Subjective History of Present Illness (HPI) 05/30/19 patient presents today for initial evaluation our clinic concerning significant wounds that she has in the right gluteal region as well as in the left abdominal area. She also has a pressure injury to her trans metatarsal amputation site on the right which does not appear to be as looking at this time but nonetheless does need to close. This occurred while she was in the hospital. The patient was admitted to the hospital on 05/17/19 discharged on 05/25/19. This was due to the significant abscess in the right gluteal region as well as in the left panus region. She has a history of diabetes which is definitely not under good control of the most useful view of an agency being 17.5. She also has hypertension, COPD, and is morbidly obese. Unfortunately the wound and the right gluteal region extends right up and adjacent to the anus which is going to make the Wound VAC impossible at this point. Nonetheless I think that we're gonna have to basically pack this with wet to dry packing at this time in order to hopefully achieve improvement. Nonetheless this is a significant wound with her diabetes be not under control along with her size and again the nature of the wound I think this is gonna take a very long time to heal. 07/04/2019 upon evaluation today patient actually appears to be doing better in  regard to her wounds. Her foot ulcer actually appears to be completely healed which is great the abdominal ulcer has filled in quite nicely and seems to be doing well although there is a lot of moisture I think that she would benefit from a silver alginate dressing. Nonetheless in regard to the wound on her right gluteal region this showed signs of improvement today still this is a fairly significant ulcer unfortunately. 08/01/2019 on evaluation patient presents for follow-up concerning her wounds over the right gluteal region as well as the left abdominal region. Fortunately both areas are showing signs of improvement which is good news. In fact significant improvement. Spent about a month since have seen her last. Overall I am extremely happy though with the findings of how the wounds have progressed at this point. READMISSION 09/22/2020 This is a now 45 year old woman previously in this clinic for a right gluteal abscess for 3 or 4 visits in 2020. She is a poorly controlled type II diabetic with the last hemoglobin A1c I see in epic at 12.7 in July 2021. She tells [place] that she is here for review of a wound on the tip of her right first metatarsal transmetatarsal amputation. She had an angiogram and angioplasty on 12/20/2018 with angioplasty of her anterior tibial and posterior tibial arteries. She has been followed by Dr. Doran Durand most recent treatment since August I think has been Santyl. She was given a 1 week course of Keflex in mid September. The patient's had an x-ray and also an MRI in August 2021 that did not show osteomyelitis but edema ABI in our clinic was 1.19 Past medical history includes hypertension, hyperlipidemia, COPD, bipolar disorder, poorly controlled type 2 diabetes with PAD. Left BKA, right transmetatarsal amputation, heart failure with preserved ejection fraction, she is minimally I am ambulatory walking only from her wheelchair to the bathroom with her walker. She only wears a  stocking on her foot. Otherwise she  is in wheelchair or in bed all day long. 11/1; patient readmitted to the clinic 2 weeks ago. She has a wound on the tip of the right first metatarsal transmetatarsal amputation site she has been using Santyl. She has Medicaid and we have to apply for assistance if we are going to continue with Santyl although I am hopeful that we will be able to change from the [facility] next week. 11/9; area on the tip of the right first metatarsal amputation site. She has been using Santyl. She was in the hospital overnight for chest pain and they gave her another tube of Santyl which is nice since she has Medicaid and Medicaid would not pay for that. Her wound is smaller 12/2; tip of the right first metatarsal amputation site. She has been using Santyl. No changes wound dimension however I think the surface of the wound is better 12/28; patient has not been here in 3 and half weeks I think transportation issues. We gave her Hydrofera Blue last time however she switched back to [place] last week because somebody told her the wound was larger. The wound is actually measuring smaller and looks healthy. 1/13; since the patient was last here she was actually seen twice in the emergency department most recently on 12/30 this was for a painful small area on the right posterior calf superiorly. Previously she was seen in the ER on 12/19 for this problem but she did not tell us about this the last time she was here. There was some question of this being a spider bite based I think on somebody killing a spider in her apartment. When she was in the ER on 12/30 she complained of increasing pain and swelling. She had previously been given doxycycline but they treated with IV clindamycin transitioning to oral clindamycin. She is still using Hydrofera Blue to the area on the medial transmit amputation on the right this is not changed much today 1/27; 2-week follow-up. The patient has Hydrofera Blue  on the medial transmet amputation site. This looks better this week after debridement last week. She has a new area on the right posterior calf that we looked at last time she has been using Santyl to this that she had leftover from the days where Medicaid paid for this. 2/3; she did not tolerate the Iodoflex well on the back of her leg although the surface of it looks a lot better today. She was approved for Santyl to this area through compassionate release. We are using Hydrofera Blue on the transmet wound on the first met head Objective Constitutional Patient is hypertensive.. Pulse regular and within target range for patient.Marland Kitchen Respirations regular, non-labored and within target range.. Temperature is normal and within the target range for the patient.Marland Kitchen Appears in no distress. Vitals Time Taken: 10:33 AM, Height: 62 in, Weight: 362 lbs, BMI: 66.2, Temperature: 98.2 F, Pulse: 88 bpm, Respiratory Rate: 20 breaths/min, Blood Pressure: 140/92 mmHg. Cardiovascular Palpable pulses on the right. General Notes: Wound exam; the area on the right first MTP this is a small wound but his usual some built-up callus flaking skin around the circumference and debris on the wound surface I used a #3 curette to aggressively clean the soft hemostasis with direct pressure. I removed subcutaneous tissue. ooOn the right posterior calf the wound I aggressively debrided last week actually looks a lot better there is still some debris around the circumference. I am going to use Santyl for a week and see if I can clean  this off next week with a mechanical debridement Integumentary (Hair, Skin) Wound #4 status is Open. Original cause of wound was Gradually Appeared. 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 The wound measures 0.3cm length x 0.3cm width x 0.1cm depth; 0.071cm^2 area and 0.007cm^3 volume. There is Fat Layer (Subcutaneous Tissue) exposed. There is no tunneling or undermining  noted. There is a medium amount of serosanguineous drainage noted. The wound margin is distinct with the outline attached to the wound base. There is large (67-100%) red granulation within the wound bed. There is a small (1-33%) amount of necrotic tissue within the wound bed including Adherent Slough. Wound #5 status is Open. Original cause of wound was Not Known. The wound is located on the Right,Posterior Lower Leg. The wound measures 1.7cm length x 2cm width x 0.1cm depth; 2.67cm^2 area and 0.267cm^3 volume. There is Fat Layer (Subcutaneous Tissue) exposed. There is no tunneling or undermining noted. There is a medium amount of serosanguineous drainage noted. The wound margin is flat and intact. There is medium (34-66%) pink, pale granulation within the wound bed. There is a medium (34-66%) amount of necrotic tissue within the wound bed including Adherent Slough. 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 There was a Excisional Skin/Subcutaneous Tissue Debridement with a total area of 0.25 sq cm performed by Ricard Dillon., MD. With the following instrument(s): Curette to remove Viable and Non-Viable tissue/material. Material removed includes Subcutaneous Tissue, Skin: Dermis, Skin: Epidermis, and Fibrin/Exudate after achieving pain control using Lidocaine 4% Topical Solution. A time out was conducted at 10:45, prior to the start of the procedure. A Minimum amount of bleeding was  controlled with Pressure. The procedure was tolerated well with a pain level of 0 throughout and a pain level of 0 following the procedure. Post Debridement Measurements: 0.3cm length x 0.3cm width x 0.1cm depth; 0.007cm^3 volume. Character of Wound/Ulcer Post Debridement is improved. Severity of Tissue Post Debridement is: Fat layer exposed. Post procedure Diagnosis Wound #4: Same as Pre-Procedure Wound #5 Pre-procedure diagnosis of Wound #5 is a Diabetic Wound/Ulcer of the Lower Extremity located on the Right,Posterior Lower Leg .Severity of Tissue Pre Debridement is: Fat layer exposed. There was a Chemical/Enzymatic/Mechanical debridement performed by Baruch Gouty, RN.Marland Kitchen Agent used was [employer]. There was no bleeding. The procedure was tolerated well. Post Debridement Measurements: 1.7cm length x 2cm width x 0.1cm depth; 0.267cm^3 volume. Character of Wound/Ulcer Post Debridement requires further debridement. Severity of Tissue Post Debridement is: Fat layer exposed. Post procedure Diagnosis Wound #5: Same as Pre-Procedure Plan Follow-up Appointments: Return Appointment in 1 week. Bathing/ Shower/ Hygiene: May shower with protection but do not get wound dressing(s) wet. - use a cast protector when not changing the dressing. May shower and wash wound with soap and water. - with dressing changes only. Edema Control - Lymphedema / SCD / Other: Elevate legs to the level of the heart or above for 30 minutes daily and/or when sitting, a frequency of: - 3-4 times throughout the day. Avoid standing for long  periods of time. Exercise regularly - may wear diabetic shoes now, may restart physical therapy, may start walking. Just ensure the filler in the diabetic Hall does not rub on the wound area. Moisturize legs daily. Off-Loading: Other: - ensure no pressure to foot wound. WOUND #4: - Amputation Site - Transmetatarsal Wound Laterality: Right Cleanser: Normal Saline (Generic) Every Other Day/30  Days Discharge Instructions: Cleanse the wound with Normal Saline prior to applying a clean dressing using gauze sponges, not tissue or cotton balls. Cleanser: Soap and Water Every Other Day/30 Days Discharge Instructions: May shower and wash wound with dial antibacterial soap and water prior to dressing change. Prim Dressing: Hydrofera Blue Ready Foam, 4x5 in Every Other Day/30 Days ary Discharge Instructions: Apply to wound bed as instructed. moisten with saline. Secondary Dressing: Woven Gauze Sponge, Non-Sterile 4x4 in (Generic) Every Other Day/30 Days Discharge Instructions: Apply over primary dressing as directed. Secured With: [employer], 4.5x3.1 (in/yd) (Generic) Every Other Day/30 Days Discharge Instructions: Secure with Kerlix as directed. Secured With: Paper T ape, 2x10 (in/yd) (Generic) Every Other Day/30 Days Discharge Instructions: Secure dressing with tape as directed. WOUND #5: - Lower Leg Wound Laterality: Right, Posterior Cleanser: Soap and Water 1 x Per Day/30 Days Discharge Instructions: May shower and wash wound with dial antibacterial soap and water prior to dressing change. Cleanser: Wound Cleanser 1 x Per Day/30 Days Discharge Instructions: Cleanse the wound with wound cleanser prior to applying a clean dressing using gauze sponges, not tissue or cotton balls. Prim Dressing: Santyl Ointment 1 x Per Day/30 Days ary Discharge Instructions: Apply nickel thick amount to wound bed as instructed Secondary Dressing: Woven Gauze Sponge, Non-Sterile 4x4 in 1 x Per Day/30 Days Discharge Instructions: Apply over primary dressing as directed. Secured With: 39M Medipore H Soft Cloth Surgical T 4 x 2 (in/yd) 1 x Per Day/30 Days ape Discharge Instructions: Secure dressing with tape as directed. 1. I continued with Hydrofera Blue to the transmet wound. I am hopeful after debridement today this will allow room for epithelialization. If not may need to apply Santyl to this  area as well 2. T the wound on the right posterior calf I am going to use Santyl this week. She will likely need a debridement o 3. She is asking me about starting to try to walk again. She has a course of the left BKA she wears a running Hall on her right foot with a filler. I am concerned about the friction and pressure on the right foot wound against the filler and I told her this. Even with padding especially when walking with a prosthesis on the other side. Electronic Signature(s) Signed: 01/08/2021 4:57:15 PM By: Linton Ham MD Entered By: Linton Ham on 01/08/2021 11:03:00 -------------------------------------------------------------------------------- SuperBill Details Patient Name: Date of Service: [name], TA [facility] 01/08/2021 Medical Record Number: [id number] Patient Account Number: [id number] Date of Birth/Sex: Treating RN: 1976/02/07 (45 y.o. [name], [name] Primary Care Provider: [name] Other Clinician: Referring Provider: Treating Provider/Extender: Mammie Russian, Ivan Anchors in Treatment: 15 Diagnosis Coding ICD-10 Codes Code Description E11.621 Type 2 diabetes mellitus with foot ulcer L97.512 Non-pressure chronic ulcer of other part of right foot with fat layer exposed Z89.512 Acquired absence of left leg below knee E11.51 Type 2 diabetes mellitus with diabetic peripheral angiopathy without gangrene L97.218 Non-pressure chronic ulcer of right calf with other specified severity Facility Procedures CPT4 Code: [id number] 9 Description: 7602 - DEBRIDE W/O ANES NON SELECT Modifier: 59 Quantity: 1 CPT4 Code:  [id number] 1 Description: 1042 - DEB SUBQ TISSUE 20 SQ CM/< ICD-10 Diagnosis Description L97.512 Non-pressure chronic ulcer of other part of right foot with fat layer exposed Modifier: Quantity: 1 Physician Procedures : CPT4 Code Description Modifier [id number] 11042 - WC PHYS SUBQ TISS 20 SQ CM ICD-10 Diagnosis Description L97.512  Non-pressure chronic ulcer of other part of right foot with fat layer exposed Quantity: 1 Electronic Signature(s) Signed: 01/08/2021 4:57:15 PM By: Linton Ham MD Signed: 01/08/2021 5:30:16 PM By: Deon Pilling Entered By: Deon Pilling on 01/08/2021 12:16:39

## 2021-01-12 ENCOUNTER — Other Ambulatory Visit: Payer: Self-pay

## 2021-01-12 NOTE — Progress Notes (Signed)
Belinda, Hall ([id number]) Visit Report for 01/08/2021 Arrival Information Details Patient Name: Date of Service: [name] [facility] 01/08/2021 10:30 A M Medical Record Number: [id number] Patient Account Number: [id number] Date of Birth/Sex: Treating RN: [birth year] (45 y.o. [name] Primary Care [name]: [name] Other Clinician: Referring [name]: Treating [name]/Extender: [name], [name] in Treatment: 15 Visit Information History Since Last Visit Added or deleted any medications: No Patient Arrived: Wheel Chair Any new allergies or adverse reactions: No Arrival Time: 10:33 Had a fall or experienced change in No Accompanied By: daughter activities of daily living that may affect Transfer Assistance: Manual risk of falls: Patient Identification Verified: Yes Signs or symptoms of abuse/neglect since last visito No Secondary Verification Process Completed: Yes Hospitalized since last visit: No Patient Requires Transmission-Based Precautions: No Implantable device outside of the clinic excluding No Patient Has Alerts: No cellular tissue based products placed in the center since last visit: Has Dressing in Place as Prescribed: Yes Pain Present Now: Yes Electronic Signature(s) Signed: 01/12/2021 9:06:40 AM By: [name] Entered By: [name] on 01/08/2021 10:33:20 -------------------------------------------------------------------------------- Complex / Palliative Patient Assessment Details Patient Name: Date of Service: [name] [facility] 01/08/2021 10:30 A M Medical Record Number: [id number] Patient Account Number: [id number] Date of Birth/Sex: Treating RN: [birth year] (45 y.o. [name] Primary Care Kentrel Clevenger: [name] Other Clinician: Referring Wandy Bossler: Treating Lakenzie Mcclafferty/Extender: [name], [name] in Treatment: 15 Palliative Management Criteria Complex Wound Management Criteria Patient has  remarkable or complex co-morbidities requiring medications or treatments that extend wound healing times. Examples: Diabetes mellitus with chronic renal failure or end stage renal disease requiring dialysis Advanced or poorly controlled rheumatoid arthritis Diabetes mellitus and end stage chronic obstructive pulmonary disease Active cancer with current chemo- or radiation therapy Uncontrolled Type 2 Diabetes, CHF, COPD Care Approach Wound Care Plan: Complex Wound Management Electronic Signature(s) Signed: 01/09/2021 4:22:00 PM By: Linton Ham MD Signed: 01/09/2021 4:51:44 PM By: Belinda Hurst RN, BSN Entered By: [name] on 01/09/2021 09:30:19 -------------------------------------------------------------------------------- Encounter Discharge Information Details Patient Name: Date of Service: [name], TA [facility] 01/08/2021 10:30 A M Medical Record Number: [id number] Patient Account Number: [id number] Date of Birth/Sex: Treating RN: [birth year] (45 y.o. [name] Primary Care Nikiesha Milford: [name] Other Clinician: Referring [name]: Treating [name]/Extender: [name], [name] in Treatment: 15 Encounter Discharge Information Items Post Procedure Vitals Discharge Condition: Stable Temperature (F): 98.2 Ambulatory Status: Wheelchair Pulse (bpm): 88 Discharge Destination: Home Respiratory Rate (breaths/min): 18 Transportation: Other Blood Pressure (mmHg): 140/92 Accompanied By: [name] Schedule Follow-up Appointment: Yes Clinical Summary of Care: Patient Declined Notes transportation service Electronic Signature(s) Signed: 01/08/2021 5:53:40 PM By: Belinda Gouty RN, BSN Entered By: [name] on 01/08/2021 11:15:17 -------------------------------------------------------------------------------- Lower Extremity Assessment Details Patient Name: Date of Service: [name] [facility] 01/08/2021 10:30 A M Medical Record Number: [id number] Patient  Account Number: [id number] Date of Birth/Sex: Treating RN: 1976-03-29 (45 y.o. [name] Primary Care Yvonnie Schinke: [name] Other Clinician: Referring [name]: Treating [name]/Extender: [name], [name] in Treatment: 15 Edema Assessment Assessed: Shirlyn Hall: No] [name]: Yes] Edema: [Left: Ye] [Right: s] Calf Left: Right: Point of Measurement: From Medial Instep 49 cm Ankle Left: Right: Point of Measurement: From Medial Instep 25 cm Vascular Assessment Pulses: Dorsalis Pedis Palpable: [Right:Yes] Electronic Signature(s) Signed: 01/08/2021 5:30:16 PM By: [name] Entered By: [name] on 01/08/2021 10:44:24 -------------------------------------------------------------------------------- Multi Wound Chart Details Patient Name: Date of Service: [name] 01/08/2021 10:30 A  M Medical Record Number: [id number] Patient Account Number: [id number] Date of Birth/Sex: Treating RN: 04/11/1976 (45 y.o. [name], [name] Primary Care [name]: [name] Other Clinician: Referring [name]: Treating [name]/Extender: [name], [name] in Treatment: 15 Vital Signs Height(in): 40 Pulse(bpm): 4 Weight(lbs): 362 Blood Pressure(mmHg): 140/92 Body Mass Index(BMI): 66 Temperature(F): 98.2 Respiratory Rate(breaths/min): 20 Photos: [4:No Photos Right Amputation Site -] [5:No Photos Right, Posterior Lower Leg] [N/A:N/A N/A] Wound Location: [4:Transmetatarsal Gradually Appeared] [5:Not Known] [N/A:N/A] Wounding Event: [4:Diabetic Wound/Ulcer of the Lower] [5:Diabetic Wound/Ulcer of the Lower] [N/A:N/A] Primary Etiology: [4:Extremity Asthma, Chronic Obstructive] [5:Extremity Asthma, Chronic Obstructive] [N/A:N/A] Comorbid History: [4:Pulmonary Disease (COPD), Sleep Pulmonary Disease (COPD), Sleep Apnea, Congestive Heart Failure, Hypertension, Peripheral Venous Disease, Type II Diabetes, Osteoarthritis, Osteomyelitis,  Neuropathy 03/06/2020] [5:Apnea, Congestive  Heart Failure, Hypertension, Peripheral Venous Disease, Type II Diabetes, Osteoarthritis, Osteomyelitis, Neuropathy 11/05/2020] [N/A:N/A] Date Acquired: [4:15] [5:3] [N/A:N/A] Weeks of Treatment: [4:Open] [5:Open] [N/A:N/A] Wound Status: [4:0.3x0.3x0.1] [5:1.7x2x0.1] [N/A:N/A] Measurements L x W x D (cm) [4:0.071] [5:2.67] [N/A:N/A] A (cm) : rea [4:0.007] [5:0.267] [N/A:N/A] Volume (cm) : [4:91.80%] [5:-18.00%] [N/A:N/A] % Reduction in A [4:rea: 97.30%] [5:-18.10%] [N/A:N/A] % Reduction in Volume: [4:Grade 2] [5:Grade 2] [N/A:N/A] Classification: [4:Medium] [5:Medium] [N/A:N/A] Exudate A mount: [4:Serosanguineous] [5:Serosanguineous] [N/A:N/A] Exudate Type: [4:red, brown] [5:red, brown] [N/A:N/A] Exudate Color: [4:Distinct, outline attached] [5:Flat and Intact] [N/A:N/A] Wound Margin: [4:Large (67-100%)] [5:Medium (34-66%)] [N/A:N/A] Granulation A mount: [4:Red] [5:Pink, Pale] [N/A:N/A] Granulation Quality: [4:Small (1-33%)] [5:Medium (34-66%)] [N/A:N/A] Necrotic A mount: [4:Fat Layer (Subcutaneous Tissue): Yes Fat Layer (Subcutaneous Tissue): Yes N/A] Exposed Structures: [4:Fascia: No Tendon: No Muscle: No Joint: No Bone: No Medium (34-66%)] [5:Fascia: No Tendon: No Muscle: No Joint: No Bone: No Small (1-33%)] [N/A:N/A] Epithelialization: [4:Debridement - Excisional] [5:Chemical/Enzymatic/Mechanical] [N/A:N/A] Debridement: Pre-procedure Verification/Time Out 10:45 [5:N/A] [N/A:N/A] Taken: [4:Lidocaine 4% Topical Solution] [5:N/A] [N/A:N/A] Pain Control: [4:Subcutaneous] [5:N/A] [N/A:N/A] Tissue Debrided: [4:Skin/Subcutaneous Tissue] [5:N/A] [N/A:N/A] Level: [4:0.25] [5:N/A] [N/A:N/A] Debridement A (sq cm): [4:rea Curette] [5:N/A] [N/A:N/A] Instrument: [4:Minimum] [5:None] [N/A:N/A] Bleeding: [4:Pressure] [5:N/A] [N/A:N/A] Hemostasis A chieved: [4:0] [5:N/A] [N/A:N/A] Procedural Pain: [4:0] [5:N/A] [N/A:N/A] Post Procedural Pain:  [4:Procedure was tolerated well] [5:Procedure was tolerated well] [N/A:N/A] Debridement Treatment Response: [4:0.3x0.3x0.1] [5:1.7x2x0.1] [N/A:N/A] Post Debridement Measurements L x W x D (cm) [4:0.007] [5:0.267] [N/A:N/A] Post Debridement Volume: (cm) [4:Debridement] [5:Debridement] [N/A:N/A] Treatment Notes Electronic Signature(s) Signed: 01/08/2021 4:57:15 PM By: Linton Ham MD Signed: 01/08/2021 5:30:16 PM By: [name] Entered By: Linton Ham on 01/08/2021 10:56:57 -------------------------------------------------------------------------------- Multi-Disciplinary Care Plan Details Patient Name: Date of Service: [name], TA [facility] 01/08/2021 10:30 A M Medical Record Number: [id number] Patient Account Number: [id number] Date of Birth/Sex: Treating RN: [birth year] (45 y.o. [name], [name] Primary Care Mayford Alberg: [name] Other Clinician: Referring Caryl Manas: Treating Eva Vallee/Extender: [name], [name] in Treatment: 15 Active Inactive Abuse / Safety / Falls / Self Care Management Nursing Diagnoses: History of Falls Potential for injury related to falls Goals: Patient will not experience any injury related to falls Date Initiated: 09/22/2020 Target Resolution Date: 02/06/2021 Goal Status: Active Patient/caregiver will verbalize/demonstrate measures taken to prevent injury and/or falls Date Initiated: 09/22/2020 Target Resolution Date: 02/06/2021 Goal Status: Active Interventions: Assess Activities of Daily Living upon admission and as needed Assess fall risk on admission and as needed Assess: immobility, friction, shearing, incontinence upon admission and as needed Assess impairment of mobility on admission and as needed per policy Assess personal safety and home safety (as indicated) on admission and as needed Assess self care needs on admission and as needed Provide  education on fall prevention Notes: Nutrition Nursing  Diagnoses: Impaired glucose control: actual or potential Potential for alteratiion in Nutrition/Potential for imbalanced nutrition Goals: Patient/caregiver agrees to and verbalizes understanding of need to use nutritional supplements and/or vitamins as prescribed Date Initiated: 09/22/2020 Date Inactivated: 11/06/2020 Target Resolution Date: 11/06/2020 Goal Status: Met Patient/caregiver will maintain therapeutic glucose control Date Initiated: 09/22/2020 Target Resolution Date: 02/06/2021 Goal Status: Active Interventions: Assess HgA1c results as ordered upon admission and as needed Assess patient nutrition upon admission and as needed per policy Provide education on elevated blood sugars and impact on wound healing Provide education on nutrition Treatment Activities: Education provided on Nutrition : 10/14/2020 Notes: Electronic Signature(s) Signed: 01/08/2021 5:30:16 PM By: [name] Entered By: [name] on 01/08/2021 10:37:42 -------------------------------------------------------------------------------- Pain Assessment Details Patient Name: Date of Service: [name] [facility] 01/08/2021 10:30 A M Medical Record Number: [id number] Patient Account Number: [id number] Date of Birth/Sex: Treating RN: 08/17/1976 (45 y.o. [name] Primary Care Kandra Graven: [name] Other Clinician: Referring Branda Chaudhary: Treating Dewell Monnier/Extender: [name], [name] in Treatment: 15 Active Problems Location of Pain Severity and Description of Pain Patient Has Paino Yes Site Locations Rate the pain. Current Pain Level: 5 Pain Management and Medication Current Pain Management: Electronic Signature(s) Signed: 01/08/2021 5:30:16 PM By: [name] Signed: 01/12/2021 9:06:40 AM By: [name] Entered By: [name] on 01/08/2021 10:35:05 -------------------------------------------------------------------------------- Patient/Caregiver Education  Details Patient Name: Date of Service: [name], TA [facility] 2/3/2022andnbsp10:30 A M Medical Record Number: [id number] Patient Account Number: [id number] Date of Birth/Gender: Treating RN: 1976-03-09 (45 y.o. [name] Primary Care Physician: [name] Other Clinician: Referring Physician: Treating Physician/Extender: [name], [name] in Treatment: 15 Education Assessment Education Provided To: Patient Education Topics Provided Elevated Blood Sugar/ Impact on Healing: Handouts: Elevated Blood Sugars: How Do They Affect Wound Healing Methods: Explain/Verbal Responses: Reinforcements needed Electronic Signature(s) Signed: 01/08/2021 5:30:16 PM By: [name] Entered By: [name] on 01/08/2021 10:37:58 -------------------------------------------------------------------------------- Wound Assessment Details Patient Name: Date of Service: [name] [facility] 01/08/2021 10:30 A M Medical Record Number: [id number] Patient Account Number: [id number] Date of Birth/Sex: Treating RN: [birth year] (45 y.o. 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 Wound Open Wounding Event: Gradually Appeared Status: Date Acquired: 03/06/2020 Comorbid Asthma, Chronic Obstructive Pulmonary Disease (COPD), Sleep Weeks Of Treatment: 15 History: Apnea, Congestive Heart Failure, Hypertension, Peripheral Venous Clustered Wound: No Disease, Type II Diabetes, Osteoarthritis, Osteomyelitis, Neuropathy Wound Measurements Length: (cm) 0.3 Width: (cm) 0.3 Depth: (cm) 0.1 Area: (cm) 0.071 Volume: (cm) 0.007 % Reduction in Area: 91.8% % Reduction in Volume: 97.3% Epithelialization: Medium  (34-66%) Tunneling: No Undermining: No Wound Description Classification: Grade 2 Wound Margin: Distinct, outline attached Exudate Amount: Medium Exudate Type: Serosanguineous Exudate Color: red, brown Foul Odor After Cleansing: No Slough/Fibrino Yes Wound Bed Granulation Amount: Large (67-100%) Exposed Structure Granulation Quality: Red Fascia Exposed: No Necrotic Amount: Small (1-33%) Fat Layer (Subcutaneous Tissue) Exposed: Yes Necrotic Quality: Adherent Slough Tendon Exposed: No Muscle Exposed: No Joint Exposed: No Bone Exposed: No Treatment Notes Wound #4 (Amputation Site - Transmetatarsal) Wound Laterality: Right Cleanser Normal Saline Discharge Instruction: Cleanse the wound with Normal Saline prior to applying a clean dressing using gauze sponges, not tissue or cotton balls. Soap and Water Discharge Instruction: May shower and  wash wound with dial antibacterial soap and water prior to dressing change. Peri-Wound Care Topical Primary Dressing Hydrofera Blue Ready Foam, 4x5 in Discharge Instruction: Apply to wound bed as instructed. moisten with saline. Secondary Dressing Woven Gauze Sponge, Non-Sterile 4x4 in Discharge Instruction: Apply over primary dressing as directed. Secured With [employer], 4.5x3.1 (in/yd) Discharge Instruction: Secure with Kerlix as directed. Paper Tape, 2x10 (in/yd) Discharge Instruction: Secure dressing with tape as directed. Compression Wrap Compression Stockings Add-Ons Electronic Signature(s) Signed: 01/08/2021 5:30:16 PM By: [name] Entered By: [name] on 01/08/2021 10:44:56 -------------------------------------------------------------------------------- Wound Assessment Details Patient Name: Date of Service: [name] [facility] 01/08/2021 10:30 A M Medical Record Number: [id number] Patient Account Number: [id number] Date of Birth/Sex: Treating RN: 01-Mar-1976 (45 y.o. [name], [name] Primary Care Alondra Vandeven:  [name] Other Clinician: Referring Darek Eifler: Treating Velecia Ovitt/Extender: [name], [name] in Treatment: 15 Wound Status Wound Number: 5 Primary Diabetic Wound/Ulcer of the Lower Extremity Etiology: Wound Location: Right, Posterior Lower Leg Wound Open Wounding Event: Not Known Status: Date Acquired: 11/05/2020 Comorbid Asthma, Chronic Obstructive Pulmonary Disease (COPD), Sleep Weeks Of Treatment: 3 History: Apnea, Congestive Heart Failure, Hypertension, Peripheral Venous Clustered Wound: No Disease, Type II Diabetes, Osteoarthritis, Osteomyelitis, Neuropathy Photos Photo Uploaded By: Mikeal Hawthorne on 01/09/2021 15:23:52 Wound Measurements Length: (cm) 1.7 Width: (cm) 2 Depth: (cm) 0.1 Area: (cm) 2.67 Volume: (cm) 0.267 % Reduction in Area: -18% % Reduction in Volume: -18.1% Epithelialization: Small (1-33%) Tunneling: No Undermining: No Wound Description Classification: Grade 2 Wound Margin: Flat and Intact Exudate Amount: Medium Exudate Type: Serosanguineous Exudate Color: red, brown Foul Odor After Cleansing: No Slough/Fibrino Yes Wound Bed Granulation Amount: Medium (34-66%) Exposed Structure Granulation Quality: Pink, Pale Fascia Exposed: No Necrotic Amount: Medium (34-66%) Fat Layer (Subcutaneous Tissue) Exposed: Yes Necrotic Quality: Adherent Slough Tendon Exposed: No Muscle Exposed: No Joint Exposed: No Bone Exposed: No Treatment Notes Wound #5 (Lower Leg) Wound Laterality: Right, Posterior Cleanser Soap and Water Discharge Instruction: May shower and wash wound with dial antibacterial soap and water prior to dressing change. Wound Cleanser Discharge Instruction: Cleanse the wound with wound cleanser prior to applying a clean dressing using gauze sponges, not tissue or cotton balls. Peri-Wound Care Topical Primary Dressing Santyl Ointment Discharge Instruction: Apply nickel thick amount to wound bed as  instructed Secondary Dressing Woven Gauze Sponge, Non-Sterile 4x4 in Discharge Instruction: Apply over primary dressing as directed. Secured With 46M Medipore H Soft Cloth Surgical T 4 x 2 (in/yd) ape Discharge Instruction: Secure dressing with tape as directed. Compression Wrap Compression Stockings Add-Ons Electronic Signature(s) Signed: 01/08/2021 5:30:16 PM By: [name] Entered By: [name] on 01/08/2021 10:45:08 -------------------------------------------------------------------------------- Vitals Details Patient Name: Date of Service: [name], TA [facility] 01/08/2021 10:30 A M Medical Record Number: [id number] Patient Account Number: [id number] Date of Birth/Sex: Treating RN: 28-Nov-1976 (45 y.o. [name], [name] Primary Care Shandria Clinch: [name] Other Clinician: Referring Rehaan Viloria: Treating Evamae Rowen/Extender: [name], [name] in Treatment: 15 Vital Signs Time Taken: 10:33 Temperature (F): 98.2 Height (in): 62 Pulse (bpm): 88 Weight (lbs): 362 Respiratory Rate (breaths/min): 20 Body Mass Index (BMI): 66.2 Blood Pressure (mmHg): 140/92 Reference Range: 80 - 120 mg / dl Electronic Signature(s) Signed: 01/12/2021 9:06:40 AM By: [name] Entered By: [name] on 01/08/2021 10:34:49

## 2021-01-15 ENCOUNTER — Ambulatory Visit: Payer: Medicaid Other | Admitting: Family Medicine

## 2021-01-15 ENCOUNTER — Other Ambulatory Visit: Payer: Self-pay

## 2021-01-15 ENCOUNTER — Encounter (HOSPITAL_BASED_OUTPATIENT_CLINIC_OR_DEPARTMENT_OTHER): Payer: Medicaid Other | Admitting: Internal Medicine

## 2021-01-15 DIAGNOSIS — E1169 Type 2 diabetes mellitus with other specified complication: Secondary | ICD-10-CM | POA: Insufficient documentation

## 2021-01-15 DIAGNOSIS — R809 Proteinuria, unspecified: Secondary | ICD-10-CM | POA: Diagnosis not present

## 2021-01-15 DIAGNOSIS — E1159 Type 2 diabetes mellitus with other circulatory complications: Secondary | ICD-10-CM | POA: Insufficient documentation

## 2021-01-15 DIAGNOSIS — Z794 Long term (current) use of insulin: Secondary | ICD-10-CM | POA: Diagnosis not present

## 2021-01-15 DIAGNOSIS — I152 Hypertension secondary to endocrine disorders: Secondary | ICD-10-CM | POA: Diagnosis not present

## 2021-01-15 DIAGNOSIS — Z5941 Food insecurity: Secondary | ICD-10-CM | POA: Diagnosis not present

## 2021-01-15 DIAGNOSIS — E785 Hyperlipidemia, unspecified: Secondary | ICD-10-CM | POA: Diagnosis not present

## 2021-01-15 DIAGNOSIS — G4733 Obstructive sleep apnea (adult) (pediatric): Secondary | ICD-10-CM | POA: Diagnosis not present

## 2021-01-15 DIAGNOSIS — L97212 Non-pressure chronic ulcer of right calf with fat layer exposed: Secondary | ICD-10-CM | POA: Diagnosis not present

## 2021-01-15 DIAGNOSIS — E11621 Type 2 diabetes mellitus with foot ulcer: Secondary | ICD-10-CM | POA: Diagnosis not present

## 2021-01-15 DIAGNOSIS — E1165 Type 2 diabetes mellitus with hyperglycemia: Secondary | ICD-10-CM | POA: Diagnosis not present

## 2021-01-15 DIAGNOSIS — E11319 Type 2 diabetes mellitus with unspecified diabetic retinopathy without macular edema: Secondary | ICD-10-CM | POA: Diagnosis not present

## 2021-01-15 DIAGNOSIS — E1129 Type 2 diabetes mellitus with other diabetic kidney complication: Secondary | ICD-10-CM | POA: Diagnosis not present

## 2021-01-15 DIAGNOSIS — L97512 Non-pressure chronic ulcer of other part of right foot with fat layer exposed: Secondary | ICD-10-CM | POA: Diagnosis not present

## 2021-01-15 DIAGNOSIS — E1140 Type 2 diabetes mellitus with diabetic neuropathy, unspecified: Secondary | ICD-10-CM | POA: Diagnosis not present

## 2021-01-15 MED ORDER — HYDROCODONE-ACETAMINOPHEN 10-325 MG PO TABS: 1.0000 | ORAL_TABLET | Freq: Three times a day (TID) | ORAL | 0 refills | Status: DC | PRN

## 2021-01-15 NOTE — Telephone Encounter (Signed)
Rx sent in, please let patient know Thanks [name], MD

## 2021-01-15 NOTE — Progress Notes (Signed)
[name], [name] ([id number]) Visit Report for 01/15/2021 Debridement Details Patient Name: Date of Service: [name] [facility] 01/15/2021 2:15 PM Medical Record Number: [id number] Patient Account Number: [id number] Date of Birth/Sex: Treating RN: 05-20-1976 (45 y.o. [name], [name] Primary Care Provider: Chrisandra Hall Other Clinician: Referring Provider: Treating Provider/Extender: [name], [name] in Treatment: 16 Debridement Performed for Assessment: Wound #4 Right Amputation Site - Transmetatarsal Performed By: Physician Ricard Dillon., MD Debridement Type: Debridement Severity of Tissue Pre Debridement: Fat layer exposed Level of Consciousness (Pre-procedure): Awake and Alert Pre-procedure Verification/Time Out Yes - 15:00 Taken: Start Time: 15:01 Pain Control: Lidocaine 4% T opical Solution T Area Debrided (L x W): otal 0.5 (cm) x 0.5 (cm) = 0.25 (cm) Tissue and other material debrided: Viable, Non-Viable, Slough, Subcutaneous, Skin: Dermis , Skin: Epidermis, Fibrin/Exudate, Slough Level: Skin/Subcutaneous Tissue Debridement Description: Excisional Instrument: Curette Bleeding: Minimum Hemostasis Achieved: Pressure End Time: 15:07 Procedural Pain: 0 Post Procedural Pain: 0 Response to Treatment: Procedure was tolerated well Level of Consciousness (Post- Awake and Alert procedure): Post Debridement Measurements of Total Wound Length: (cm) 0.3 Width: (cm) 0.3 Depth: (cm) 0.1 Volume: (cm) 0.007 Character of Wound/Ulcer Post Debridement: Improved Severity of Tissue Post Debridement: Fat layer exposed Post Procedure Diagnosis Same as Pre-procedure Electronic Signature(s) Signed: 01/15/2021 5:13:40 PM By: Linton Ham MD Signed: 01/15/2021 6:08:01 PM By: Deon Pilling Entered By: Linton Ham on 01/15/2021 15:53:36 -------------------------------------------------------------------------------- Debridement Details Patient Name: Date of  Service: [name], [name] 01/15/2021 2:15 PM Medical Record Number: [id number] Patient Account Number: [id number] Date of Birth/Sex: Treating RN: 03-28-1976 (45 y.o. [name], [name] Primary Care Provider: Chrisandra Hall Other Clinician: Referring Provider: Treating Provider/Extender: [name], [name] in Treatment: 16 Debridement Performed for Assessment: Wound #5 Right,Posterior Lower Leg Performed By: Clinician Baruch Gouty, RN Debridement Type: Chemical/Enzymatic/Mechanical Agent Used: Santyl Severity of Tissue Pre Debridement: Fat layer exposed Level of Consciousness (Pre-procedure): Awake and Alert Pre-procedure Verification/Time Out No Taken: Bleeding: None Response to Treatment: Procedure was tolerated well Level of Consciousness (Post- Awake and Alert procedure): Post Debridement Measurements of Total Wound Length: (cm) 1.8 Width: (cm) 2.3 Depth: (cm) 0.1 Volume: (cm) 0.325 Character of Wound/Ulcer Post Debridement: Requires Further Debridement Severity of Tissue Post Debridement: Fat layer exposed Post Procedure Diagnosis Same as Pre-procedure Electronic Signature(s) Signed: 01/15/2021 5:13:40 PM By: Linton Ham MD Signed: 01/15/2021 6:08:01 PM By: Deon Pilling Entered By: Linton Ham on 01/15/2021 15:53:44 -------------------------------------------------------------------------------- HPI Details Patient Name: Date of Service: [name], [name] 01/15/2021 2:15 PM Medical Record Number: [id number] Patient Account Number: [id number] Date of Birth/Sex: Treating RN: 11/06/1976 (45 y.o. [name] Primary Care Provider: Chrisandra Hall Other Clinician: Referring Provider: Treating Provider/Extender: [name], [name] in Treatment: 16 History of Present Illness HPI Description: 05/30/19 patient presents today for initial evaluation our clinic concerning significant wounds that she has in the right gluteal  region as well as in the left abdominal area. She also has a pressure injury to her trans metatarsal amputation site on the right which does not appear to be as looking at this time but nonetheless does need to close. This occurred while she was in the hospital. The patient was admitted to the hospital on 05/17/19 discharged on 05/25/19. This was due to the significant abscess in the right gluteal region as well as in the left panus region. She has a history of diabetes which is definitely not under good control of the most useful view of an agency  being 17.5. She also has hypertension, COPD, and is morbidly obese. Unfortunately the wound and the right gluteal region extends right up and adjacent to the anus which is going to make the Wound VAC impossible at this point. Nonetheless I think that we're gonna have to basically pack this with wet to dry packing at this time in order to hopefully achieve improvement. Nonetheless this is a significant wound with her diabetes be not under control along with her size and again the nature of the wound I think this is gonna take a very long time to heal. 07/04/2019 upon evaluation today patient actually appears to be doing better in regard to her wounds. Her foot ulcer actually appears to be completely healed which is great the abdominal ulcer has filled in quite nicely and seems to be doing well although there is a lot of moisture I think that she would benefit from a silver alginate dressing. Nonetheless in regard to the wound on her right gluteal region this showed signs of improvement today still this is a fairly significant ulcer unfortunately. 08/01/2019 on evaluation patient presents for follow-up concerning her wounds over the right gluteal region as well as the left abdominal region. Fortunately both areas are showing signs of improvement which is good news. In fact significant improvement. Spent about a month since have seen her last. Overall I  am extremely happy though with the findings of how the wounds have progressed at this point. READMISSION 09/22/2020 This is a now 45 year old woman previously in this clinic for a right gluteal abscess for 3 or 4 visits in 2020. She is a poorly controlled type II diabetic with the last hemoglobin A1c I see in epic at 12.7 in July 2021. She tells [place] that she is here for review of a wound on the tip of her right first metatarsal transmetatarsal amputation. She had an angiogram and angioplasty on 12/20/2018 with angioplasty of her anterior tibial and posterior tibial arteries. She has been followed by Dr. Doran Durand most recent treatment since August I think has been Santyl. She was given a 1 week course of Keflex in mid September. The patient's had an x-ray and also an MRI in August 2021 that did not show osteomyelitis but edema ABI in our clinic was 1.19 Past medical history includes hypertension, hyperlipidemia, COPD, bipolar disorder, poorly controlled type 2 diabetes with PAD. Left BKA, right transmetatarsal amputation, heart failure with preserved ejection fraction, she is minimally I am ambulatory walking only from her wheelchair to the bathroom with her walker. She only wears a stocking on her foot. Otherwise she is in wheelchair or in bed all day long. 11/1; patient readmitted to the clinic 2 weeks ago. She has a wound on the tip of the right first metatarsal transmetatarsal amputation site she has been using Santyl. She has Medicaid and we have to apply for assistance if we are going to continue with Santyl although I am hopeful that we will be able to change from the [facility] next week. 11/9; area on the tip of the right first metatarsal amputation site. She has been using Santyl. She was in the hospital overnight for chest pain and they gave her another tube of Santyl which is nice since she has Medicaid and Medicaid would not pay for that. Her wound is smaller 12/2; tip of the right first  metatarsal amputation site. She has been using Santyl. No changes wound dimension however I think the surface of the wound is better 12/28; patient  has not been here in 3 and half weeks I think transportation issues. We gave her Hydrofera Blue last time however she switched back to [place] last week because somebody told her the wound was larger. The wound is actually measuring smaller and looks healthy. 1/13; since the patient was last here she was actually seen twice in the emergency department most recently on 12/30 this was for a painful small area on the right posterior calf superiorly. Previously she was seen in the ER on 12/19 for this problem but she did not tell us about this the last time she was here. There was some question of this being a spider bite based I think on somebody killing a spider in her apartment. When she was in the ER on 12/30 she complained of increasing pain and swelling. She had previously been given doxycycline but they treated with IV clindamycin transitioning to oral clindamycin. She is still using Hydrofera Blue to the area on the medial transmit amputation on the right this is not changed much today 1/27; 2-week follow-up. The patient has Hydrofera Blue on the medial transmet amputation site. This looks better this week after debridement last week. She has a new area on the right posterior calf that we looked at last time she has been using Santyl to this that she had leftover from the days where Medicaid paid for this. 2/3; she did not tolerate the Iodoflex well on the back of her leg although the surface of it looks a lot better today. She was approved for Santyl to this area through compassionate release. We are using Hydrofera Blue on the transmet wound on the first met head 2/10; we have been using Santyl on the posterior right leg and the wound on the medial first met head. Electronic Signature(s) Signed: 01/15/2021 5:13:40 PM By: Linton Ham MD Entered  By: Linton Ham on 01/15/2021 15:54:12 -------------------------------------------------------------------------------- Physical Exam Details Patient Name: Date of Service: [name], [name] 01/15/2021 2:15 PM Medical Record Number: [id number] Patient Account Number: [id number] Date of Birth/Sex: Treating RN: 1976/11/07 (45 y.o. [name] Primary Care Provider: Chrisandra Hall Other Clinician: Referring Provider: Treating Provider/Extender: [name], [name] in Treatment: 16 Constitutional Sitting or standing Blood Pressure is within target range for patient.. Pulse regular and within target range for patient.Marland Kitchen Respirations regular, non-labored and within target range.. Temperature is normal and within the target range for the patient.Marland Kitchen Appears in no distress. Notes Wound exam; the area on the right first MTP is a small wound I used a #3 curette again to clean off the margins here. Hemostasis with direct pressure. On the right posterior calf the wound looks a lot better no debridement is necessary. Electronic Signature(s) Signed: 01/15/2021 5:13:40 PM By: Linton Ham MD Entered By: Linton Ham on 01/15/2021 15:54:53 -------------------------------------------------------------------------------- Physician Orders Details Patient Name: Date of Service: [name], [name] 01/15/2021 2:15 PM Medical Record Number: [id number] Patient Account Number: [id number] Date of Birth/Sex: Treating RN: 16-Feb-1976 (45 y.o. 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 Bathing/ Shower/ Hygiene May shower with protection but do not get wound dressing(s) wet. - use a cast protector when not changing the dressing. May shower and wash wound with soap and water. - with dressing changes only. Edema Control - Lymphedema / SCD / Other Right Lower Extremity Elevate legs to the level of the heart or above for 30 minutes daily and/or when sitting, a frequency of: - 3-4 times throughout the day. Avoid standing for long periods of time. Exercise regularly - may wear diabetic shoes now, may restart physical therapy, may start walking. Just ensure the filler in the diabetic Hall does not rub on the wound area. Moisturize legs daily. Off-Loading Other: - ensure no pressure to foot wound. Wound Treatment Wound #4 - Amputation Site - Transmetatarsal Wound Laterality: Right Cleanser: Soap and Water Every Other Day/30 Days Discharge Instructions: May shower and wash wound with dial antibacterial soap and water prior to dressing change. Cleanser: Normal Saline (Generic) Every Other Day/30 Days Discharge Instructions: Cleanse the wound with Normal Saline prior to applying a clean dressing using gauze sponges, not tissue or cotton balls. Prim Dressing: Hydrofera Blue Ready Foam, 4x5 in Every Other Day/30 Days ary Discharge Instructions: Apply to wound bed as instructed. moisten with saline. Secondary Dressing: Woven Gauze Sponge, Non-Sterile 4x4 in (Generic) Every Other Day/30 Days Discharge Instructions: Apply over primary dressing as directed. Secured With: [employer], 4.5x3.1 (in/yd) (Generic) Every Other Day/30 Days Discharge Instructions: Secure with Kerlix as directed. Secured With: Paper Tape, 2x10 (in/yd) (Generic) Every Other Day/30 Days Discharge  Instructions: Secure dressing with tape as directed. Wound #5 - Lower Leg Wound Laterality: Right, Posterior Cleanser: Soap and Water 1 x Per Day/30 Days Discharge Instructions: May shower and wash wound with dial antibacterial soap and water prior to dressing change. Cleanser: Wound Cleanser 1 x Per Day/30 Days Discharge Instructions: Cleanse the wound with wound cleanser prior to applying a clean dressing using gauze sponges, not tissue or cotton balls. Prim Dressing: Santyl Ointment 1 x Per Day/30 Days ary Discharge Instructions: Apply nickel thick amount to wound bed as instructed Secondary Dressing: Woven Gauze Sponge, Non-Sterile 4x4 in 1 x Per Day/30 Days Discharge Instructions: Apply over primary dressing as directed. Secured With: 33M Medipore H Soft Cloth Surgical T 4 x 2 (in/yd) 1 x Per Day/30 Days ape Discharge Instructions: Secure dressing with tape as directed. Electronic Signature(s) Signed: 01/15/2021 5:13:40 PM By: Linton Ham MD Signed: 01/15/2021 6:08:01 PM By: Deon Pilling Entered By: Deon Pilling on 01/15/2021 15:09:31 -------------------------------------------------------------------------------- Problem List Details Patient Name: Date of Service: [name], TA [facility] 01/15/2021 2:15 PM Medical Record Number: [id number] Patient Account Number: [id number] Date of Birth/Sex: Treating RN: 09-27-1976 (45 y.o. 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 [name] Primary Care Provider: Chrisandra Hall Other Clinician: Referring Provider: Treating Provider/Extender: [name], [name] in Treatment: 16 Subjective History of Present Illness (HPI) 05/30/19 patient presents today for initial evaluation our clinic concerning significant wounds that she has in the right gluteal region as well as in the left abdominal area. She also has a pressure injury to her trans metatarsal amputation site on the right which does not appear to be as looking at this time but nonetheless does need to close. This occurred while she was in the hospital. The patient was admitted to the hospital on 05/17/19 discharged on 05/25/19. This was due to the significant abscess in the right gluteal region as well as in the left panus region. She has a history of diabetes which is definitely not under good control of the most useful view of an agency being 17.5. She also has hypertension, COPD, and is morbidly obese. Unfortunately the wound and the right gluteal region extends right up and adjacent to the anus which is going to make the Wound VAC impossible at this point. Nonetheless I think that we're gonna have to basically pack this with wet to dry packing at this time in order to hopefully achieve improvement. Nonetheless this is a significant wound with her diabetes be  not under control along with her size and again the nature of the wound I think this is gonna take a very long time to heal. 07/04/2019 upon evaluation today patient actually appears to be doing better in regard to her wounds. Her foot ulcer actually appears to be completely healed which is great the abdominal ulcer has filled in quite nicely and seems to be doing well although there is a lot of moisture I think that she would benefit from a silver alginate dressing. Nonetheless in regard to the wound on her right gluteal region this showed signs of improvement today still this is a fairly significant ulcer unfortunately. 08/01/2019 on evaluation patient presents for follow-up concerning her wounds over the right gluteal region as well as the left abdominal region. Fortunately both areas are showing signs of improvement which is good news. In fact significant improvement. Spent about a month since have seen her last. Overall I am extremely happy though with the findings of how the wounds have progressed at this point. READMISSION 09/22/2020 This is a now 45 year old woman previously in this clinic for a right gluteal abscess for 3 or 4 visits in 2020. She is a poorly controlled type II diabetic with the last hemoglobin A1c I see in epic at 12.7 in July 2021. She tells [place] that she is here for review of a wound on the tip of her right first metatarsal transmetatarsal amputation. She had an angiogram and angioplasty on 12/20/2018 with angioplasty of her anterior tibial and posterior tibial arteries. She has been followed by Dr. Doran Durand most recent treatment since August I think has been Santyl. She was given a 1 week course of Keflex in mid September. The patient's had an x-ray and also an MRI in August 2021 that did not show osteomyelitis but edema ABI in our clinic was 1.19 Past medical history includes hypertension, hyperlipidemia, COPD, bipolar disorder, poorly controlled type 2 diabetes with PAD. Left  BKA, right transmetatarsal amputation, heart failure with preserved ejection fraction, she is minimally I am ambulatory walking  only from her wheelchair to the bathroom with her walker. She only wears a stocking on her foot. Otherwise she is in wheelchair or in bed all day long. 11/1; patient readmitted to the clinic 2 weeks ago. She has a wound on the tip of the right first metatarsal transmetatarsal amputation site she has been using Santyl. She has Medicaid and we have to apply for assistance if we are going to continue with Santyl although I am hopeful that we will be able to change from the [facility] next week. 11/9; area on the tip of the right first metatarsal amputation site. She has been using Santyl. She was in the hospital overnight for chest pain and they gave her another tube of Santyl which is nice since she has Medicaid and Medicaid would not pay for that. Her wound is smaller 12/2; tip of the right first metatarsal amputation site. She has been using Santyl. No changes wound dimension however I think the surface of the wound is better 12/28; patient has not been here in 3 and half weeks I think transportation issues. We gave her Hydrofera Blue last time however she switched back to [place] last week because somebody told her the wound was larger. The wound is actually measuring smaller and looks healthy. 1/13; since the patient was last here she was actually seen twice in the emergency department most recently on 12/30 this was for a painful small area on the right posterior calf superiorly. Previously she was seen in the ER on 12/19 for this problem but she did not tell us about this the last time she was here. There was some question of this being a spider bite based I think on somebody killing a spider in her apartment. When she was in the ER on 12/30 she complained of increasing pain and swelling. She had previously been given doxycycline but they treated with IV clindamycin  transitioning to oral clindamycin. She is still using Hydrofera Blue to the area on the medial transmit amputation on the right this is not changed much today 1/27; 2-week follow-up. The patient has Hydrofera Blue on the medial transmet amputation site. This looks better this week after debridement last week. She has a new area on the right posterior calf that we looked at last time she has been using Santyl to this that she had leftover from the days where Medicaid paid for this. 2/3; she did not tolerate the Iodoflex well on the back of her leg although the surface of it looks a lot better today. She was approved for Santyl to this area through compassionate release. We are using Hydrofera Blue on the transmet wound on the first met head 2/10; we have been using Santyl on the posterior right leg and the wound on the medial first met head. Objective Constitutional Sitting or standing Blood Pressure is within target range for patient.. Pulse regular and within target range for patient.Marland Kitchen Respirations regular, non-labored and within target range.. Temperature is normal and within the target range for the patient.Marland Kitchen Appears in no distress. Vitals Time Taken: 2:44 PM, Height: 62 in, Weight: 362 lbs, BMI: 66.2, Temperature: 97.9 F, Pulse: 96 bpm, Respiratory Rate: 18 breaths/min, Blood Pressure: 130/85 mmHg, Capillary Blood Glucose: 304 mg/dl. General Notes: glucose per pt report General Notes: Wound exam; the area on the right first MTP is a small wound I used a #3 curette again to clean off the margins here. Hemostasis with direct pressure. ooOn the right posterior calf the wound  looks a lot better no debridement is necessary. Integumentary (Hair, Skin) Wound #4 status is Open. Original cause of wound was Gradually Appeared. 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 The wound measures 0.3cm length x 0.3cm width x 0.1cm depth; 0.071cm^2 area and 0.007cm^3 volume. There is  Fat Layer (Subcutaneous Tissue) exposed. There is no tunneling or undermining noted. There is a medium amount of serosanguineous drainage noted. The wound margin is distinct with the outline attached to the wound base. There is large (67-100%) red granulation within the wound bed. There is a small (1-33%) amount of necrotic tissue within the wound bed including Adherent Slough. Wound #5 status is Open. Original cause of wound was Not Known. The wound is located on the Right,Posterior Lower Leg. The wound measures 1.8cm length x 2.3cm width x 0.1cm depth; 3.252cm^2 area and 0.325cm^3 volume. There is Fat Layer (Subcutaneous Tissue) exposed. There is no tunneling or undermining noted. There is a medium amount of serosanguineous drainage noted. The wound margin is flat and intact. There is medium (34-66%) pink, pale granulation within the wound bed. There is a medium (34-66%) amount of necrotic tissue within the wound bed including Adherent Slough. 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 There was a Excisional Skin/Subcutaneous Tissue Debridement with a total area of 0.25 sq cm performed by Ricard Dillon., MD. With the following instrument(s): Curette to remove Viable and Non-Viable tissue/material. Material removed includes Subcutaneous Tissue, Slough, Skin: Dermis, Skin: Epidermis, and Fibrin/Exudate after achieving pain control using Lidocaine 4% Topical Solution. A time out was  conducted at 15:00, prior to the start of the procedure. A Minimum amount of bleeding was controlled with Pressure. The procedure was tolerated well with a pain level of 0 throughout and a pain level of 0 following the procedure. Post Debridement Measurements: 0.3cm length x 0.3cm width x 0.1cm depth; 0.007cm^3 volume. Character of Wound/Ulcer Post Debridement is improved. Severity of Tissue Post Debridement is: Fat layer exposed. Post procedure Diagnosis Wound #4: Same as Pre-Procedure Wound #5 Pre-procedure diagnosis of Wound #5 is a Diabetic Wound/Ulcer of the Lower Extremity located on the Right,Posterior Lower Leg .Severity of Tissue Pre Debridement is: Fat layer exposed. There was a Chemical/Enzymatic/Mechanical debridement performed by Baruch Gouty, RN.Marland Kitchen Agent used was [employer]. There was no bleeding. The procedure was tolerated well. Post Debridement Measurements: 1.8cm length x 2.3cm width x 0.1cm depth; 0.325cm^3 volume. Character of Wound/Ulcer Post Debridement requires further debridement. Severity of Tissue Post Debridement is: Fat layer exposed. Post procedure Diagnosis Wound #5: Same as Pre-Procedure Plan Follow-up Appointments: Return Appointment in 2 weeks. Bathing/ Shower/ Hygiene: May shower with protection but do not get wound dressing(s) wet. - use a cast protector when not changing the dressing. May shower and wash wound with soap and water. - with dressing changes only. Edema Control - Lymphedema / SCD / Other: Elevate legs to the level of the heart or above for 30 minutes daily and/or when sitting, a frequency of: - 3-4 times throughout the day. Avoid standing for  long periods of time. Exercise regularly - may wear diabetic shoes now, may restart physical therapy, may start walking. Just ensure the filler in the diabetic Hall does not rub on the wound area. Moisturize legs daily. Off-Loading: Other: - ensure no pressure to foot wound. WOUND #4: - Amputation Site -  Transmetatarsal Wound Laterality: Right Cleanser: Soap and Water Every Other Day/30 Days Discharge Instructions: May shower and wash wound with dial antibacterial soap and water prior to dressing change. Cleanser: Normal Saline (Generic) Every Other Day/30 Days Discharge Instructions: Cleanse the wound with Normal Saline prior to applying a clean dressing using gauze sponges, not tissue or cotton balls. Prim Dressing: Hydrofera Blue Ready Foam, 4x5 in Every Other Day/30 Days ary Discharge Instructions: Apply to wound bed as instructed. moisten with saline. Secondary Dressing: Woven Gauze Sponge, Non-Sterile 4x4 in (Generic) Every Other Day/30 Days Discharge Instructions: Apply over primary dressing as directed. Secured With: [employer], 4.5x3.1 (in/yd) (Generic) Every Other Day/30 Days Discharge Instructions: Secure with Kerlix as directed. Secured With: Paper T ape, 2x10 (in/yd) (Generic) Every Other Day/30 Days Discharge Instructions: Secure dressing with tape as directed. WOUND #5: - Lower Leg Wound Laterality: Right, Posterior Cleanser: Soap and Water 1 x Per Day/30 Days Discharge Instructions: May shower and wash wound with dial antibacterial soap and water prior to dressing change. Cleanser: Wound Cleanser 1 x Per Day/30 Days Discharge Instructions: Cleanse the wound with wound cleanser prior to applying a clean dressing using gauze sponges, not tissue or cotton balls. Prim Dressing: Santyl Ointment 1 x Per Day/30 Days ary Discharge Instructions: Apply nickel thick amount to wound bed as instructed Secondary Dressing: Woven Gauze Sponge, Non-Sterile 4x4 in 1 x Per Day/30 Days Discharge Instructions: Apply over primary dressing as directed. Secured With: 56M Medipore H Soft Cloth Surgical T 4 x 2 (in/yd) 1 x Per Day/30 Days ape Discharge Instructions: Secure dressing with tape as directed. 1. Hydrofera Blue to the right first met head 2. Continue with Santyl to the wound on  the back of the calf although we may be able to change to a collagen-based dressing by the time I see her next 3. We received lab work from the patient's diabetes doctor at [place] she has an A1c of 14.7. Electronic Signature(s) Signed: 01/15/2021 5:13:40 PM By: Linton Ham MD Entered By: Linton Ham on 01/15/2021 15:56:09 -------------------------------------------------------------------------------- SuperBill Details Patient Name: Date of Service: [name], TA [facility] 01/15/2021 Medical Record Number: [id number] Patient Account Number: [id number] Date of Birth/Sex: Treating RN: 10-19-1976 (45 y.o. [name], [name] Primary Care Provider: Chrisandra Hall Other Clinician: Referring Provider: Treating Provider/Extender: [name], [name] in Treatment: 16 Diagnosis Coding ICD-10 Codes Code Description E11.621 Type 2 diabetes mellitus with foot ulcer L97.512 Non-pressure chronic ulcer of other part of right foot with fat layer exposed Z89.512 Acquired absence of left leg below knee E11.51 Type 2 diabetes mellitus with diabetic peripheral angiopathy without gangrene L97.218 Non-pressure chronic ulcer of right calf with other specified severity Facility Procedures CPT4 Code: 09470962 Description: 83662 - DEBRIDE W/O ANES NON SELECT Modifier: Quantity: 1 CPT4 Code: 94765465 Description: 11042 - DEB SUBQ TISSUE 20 SQ CM/< ICD-10 Diagnosis Description L97.512 Non-pressure chronic ulcer of other part of right foot with fat layer exposed Modifier: Quantity: 1 Physician Procedures : CPT4 Code Description Modifier [id number] - WC PHYS SUBQ TISS 20 SQ CM ICD-10 Diagnosis Description L97.512 Non-pressure chronic ulcer of other part of right foot with fat layer exposed Quantity: 1  Electronic Signature(s) Signed: 01/15/2021 5:13:40 PM By: Linton Ham MD Entered By: Linton Ham on 01/15/2021 15:56:31

## 2021-01-15 NOTE — Telephone Encounter (Signed)
Patient is calling to check status of refill.

## 2021-01-15 NOTE — Progress Notes (Signed)
[name], [name] ([id number]) Visit Report for 01/15/2021 Arrival Information Details Patient Name: Date of Service: [name] [facility] 01/15/2021 2:15 PM Medical Record Number: [id number] Patient Account Number: [id number] Date of Birth/Sex: Treating RN: 09/18/1976 (45 y.o. [name] Primary Care Eldon Zietlow: [name] Other Clinician: Referring Hasten Sweitzer: Treating Londell Noll/Extender: Mammie Russian, Ivan Anchors in Treatment: 16 Visit Information History Since Last Visit Added or deleted any medications: No Patient Arrived: Wheel Chair Any new allergies or adverse reactions: No Arrival Time: 14:44 Had a fall or experienced change in No Accompanied By: daughter activities of daily living that may affect Transfer Assistance: None risk of falls: Patient Identification Verified: Yes Signs or symptoms of abuse/neglect since last visito No Secondary Verification Process Completed: Yes Implantable device outside of the clinic excluding No Patient Requires Transmission-Based Precautions: No cellular tissue based products placed in the center Patient Has Alerts: No since last visit: Has Dressing in Place as Prescribed: Yes Pain Present Now: Yes Electronic Signature(s) Signed: 01/15/2021 5:58:12 PM By: Levan Hurst RN, BSN Entered By: Levan Hurst on 01/15/2021 14:44:55 -------------------------------------------------------------------------------- Encounter Discharge Information Details Patient Name: Date of Service: [name], [name] 01/15/2021 2:15 PM Medical Record Number: [id number] Patient Account Number: [id number] Date of Birth/Sex: Treating RN: 07-12-1976 (45 y.o. [name] Primary Care Dominie Benedick: [name] Other Clinician: Referring Theodis Kinsel: Treating Aulden Calise/Extender: Mammie Russian, Ivan Anchors in Treatment: 16 Encounter Discharge Information Items Post Procedure Vitals Discharge Condition: Stable Temperature (F):  97.9 Ambulatory Status: Wheelchair Pulse (bpm): 96 Discharge Destination: Home Respiratory Rate (breaths/min): 20 Transportation: Other Blood Pressure (mmHg): 130/85 Accompanied By: Charolett Bumpers Schedule Follow-up Appointment: Yes Clinical Summary of Care: Patient Declined Notes Medicaid transportation Electronic Signature(s) Signed: 01/15/2021 5:55:42 PM By: Baruch Gouty RN, BSN Entered By: Baruch Gouty on 01/15/2021 16:06:17 -------------------------------------------------------------------------------- Lower Extremity Assessment Details Patient Name: Date of Service: [name] [facility] 01/15/2021 2:15 PM Medical Record Number: [id number] Patient Account Number: [id number] Date of Birth/Sex: Treating RN: 1976-01-17 (45 y.o. [name] Primary Care Christne Platts: [name] Other Clinician: Referring Nayleen Janosik: Treating Ellyn Rubiano/Extender: Mammie Russian, Ivan Anchors in Treatment: 16 Edema Assessment Assessed: Shirlyn Goltz: No] Patrice Paradise: No] Edema: [Left: Ye] [Right: s] Calf Left: Right: Point of Measurement: From Medial Instep 49 cm Ankle Left: Right: Point of Measurement: From Medial Instep 25 cm Vascular Assessment Pulses: Dorsalis Pedis Palpable: [Right:Yes] Electronic Signature(s) Signed: 01/15/2021 5:58:12 PM By: Levan Hurst RN, BSN Entered By: Levan Hurst on 01/15/2021 14:45:48 -------------------------------------------------------------------------------- Multi Wound Chart Details Patient Name: Date of Service: [name], [name] 01/15/2021 2:15 PM Medical Record Number: [id number] Patient Account Number: [id number] Date of Birth/Sex: Treating RN: [birth year] (45 y.o. [name], [name] Primary Care Wojciech Willetts: [name] Other Clinician: Referring Naevia Unterreiner: Treating Sallye Lunz/Extender: Mammie Russian, Ivan Anchors in Treatment: 16 Vital Signs Height(in): 62 Capillary Blood Glucose(mg/dl): 304 Weight(lbs): 362 Pulse(bpm): 68 Body  Mass Index(BMI): 13 Blood Pressure(mmHg): 130/85 Temperature(F): 97.9 Respiratory Rate(breaths/min): 18 Photos: [4:No Photos Right Amputation Site -] [5:No Photos Right, Posterior Lower Leg] [N/A:N/A N/A] Wound Location: [4:Transmetatarsal Gradually Appeared] [5:Not Known] [N/A:N/A] Wounding Event: [4:Diabetic Wound/Ulcer of the Lower] [5:Diabetic Wound/Ulcer of the Lower] [N/A:N/A] Primary Etiology: [4:Extremity Asthma, Chronic Obstructive] [5:Extremity Asthma, Chronic Obstructive] [N/A:N/A] Comorbid History: [4:Pulmonary Disease (COPD), Sleep Apnea, Congestive Heart Failure, Hypertension, Peripheral Venous Disease, Type II Diabetes, Osteoarthritis, Osteomyelitis, Neuropathy 03/06/2020] [5:Pulmonary Disease (COPD), Sleep Apnea, Congestive  Heart Failure, Hypertension, Peripheral Venous Disease, Type II Diabetes, Osteoarthritis, Osteomyelitis, Neuropathy 11/05/2020] [N/A:N/A] Date Acquired: [4:16] [5:4] [N/A:N/A] Weeks of Treatment: [4:Open] [5:Open] [  N/A:N/A] Wound Status: [4:0.3x0.3x0.1] [5:1.8x2.3x0.1] [N/A:N/A] Measurements L x W x D (cm) [4:0.071] [5:3.252] [N/A:N/A] A (cm) : rea [4:0.007] [5:0.325] [N/A:N/A] Volume (cm) : [4:91.80%] [5:-43.80%] [N/A:N/A] % Reduction in A [4:rea: 97.30%] [5:-43.80%] [N/A:N/A] % Reduction in Volume: [4:Grade 2] [5:Grade 2] [N/A:N/A] Classification: [4:Medium] [5:Medium] [N/A:N/A] Exudate A mount: [4:Serosanguineous] [5:Serosanguineous] [N/A:N/A] Exudate Type: [4:red, brown] [5:red, brown] [N/A:N/A] Exudate Color: [4:Distinct, outline attached] [5:Flat and Intact] [N/A:N/A] Wound Margin: [4:Large (67-100%)] [5:Medium (34-66%)] [N/A:N/A] Granulation A mount: [4:Red] [5:Pink, Pale] [N/A:N/A] Granulation Quality: [4:Small (1-33%)] [5:Medium (34-66%)] [N/A:N/A] Necrotic A mount: [4:Fat Layer (Subcutaneous Tissue): Yes Fat Layer (Subcutaneous Tissue): Yes N/A] Exposed Structures: [4:Fascia: No Tendon: No Muscle: No Joint: No Bone: No Large (67-100%)]  [5:Fascia: No Tendon: No Muscle: No Joint: No Bone: No Small (1-33%)] [N/A:N/A] Epithelialization: [4:Debridement - Excisional] [5:Chemical/Enzymatic/Mechanical] [N/A:N/A] Debridement: Pre-procedure Verification/Time Out 15:00 [5:N/A] [N/A:N/A] Taken: [4:Lidocaine 4% Topical Solution] [5:N/A] [N/A:N/A] Pain Control: [4:Subcutaneous, Slough] [5:N/A] [N/A:N/A] Tissue Debrided: [4:Skin/Subcutaneous Tissue] [5:N/A] [N/A:N/A] Level: [4:0.25] [5:N/A] [N/A:N/A] Debridement A (sq cm): [4:rea Curette] [5:N/A] [N/A:N/A] Instrument: [4:Minimum] [5:None] [N/A:N/A] Bleeding: [4:Pressure] [5:N/A] [N/A:N/A] Hemostasis A chieved: [4:0] [5:N/A] [N/A:N/A] Procedural Pain: [4:0] [5:N/A] [N/A:N/A] Post Procedural Pain: [4:Procedure was tolerated well] [5:Procedure was tolerated well] [N/A:N/A] Debridement Treatment Response: [4:0.3x0.3x0.1] [5:1.8x2.3x0.1] [N/A:N/A] Post Debridement Measurements L x W x D (cm) [4:0.007] [5:0.325] [N/A:N/A] Post Debridement Volume: (cm) [4:Debridement] [5:Debridement] [N/A:N/A] Treatment Notes Electronic Signature(s) Signed: 01/15/2021 5:13:40 PM By: Baltazar Najjar MD Signed: 01/15/2021 6:08:01 PM By: Shawn Stall Entered By: Baltazar Najjar on 01/15/2021 15:53:28 -------------------------------------------------------------------------------- Multi-Disciplinary Care Plan Details Patient Name: Date of Service: Idolina Primer, Janalyn Harder [facility] 01/15/2021 2:15 PM Medical Record Number: [id number] Patient Account Number: [id number] Date of Birth/Sex: Treating RN: 1976/04/22 (44 y.o. Debara Pickett, Yvonne Kendall Primary Care Evely Gainey: Levert Feinstein Other Clinician: Referring Joakim Huesman: Treating Lynix Bonine/Extender: Margarita Mail, [place] Calix in Treatment: 16 Active Inactive Abuse / Safety / Falls / Self Care Management Nursing Diagnoses: History of Falls Potential for injury related to falls Goals: Patient will not experience any injury related to falls Date Initiated:  09/22/2020 Target Resolution Date: 02/06/2021 Goal Status: Active Patient/caregiver will verbalize/demonstrate measures taken to prevent injury and/or falls Date Initiated: 09/22/2020 Target Resolution Date: 02/06/2021 Goal Status: Active Interventions: Assess Activities of Daily Living upon admission and as needed Assess fall risk on admission and as needed Assess: immobility, friction, shearing, incontinence upon admission and as needed Assess impairment of mobility on admission and as needed per policy Assess personal safety and home safety (as indicated) on admission and as needed Assess self care needs on admission and as needed Provide education on fall prevention Notes: Nutrition Nursing Diagnoses: Impaired glucose control: actual or potential Potential for alteratiion in Nutrition/Potential for imbalanced nutrition Goals: Patient/caregiver agrees to and verbalizes understanding of need to use nutritional supplements and/or vitamins as prescribed Date Initiated: 09/22/2020 Date Inactivated: 11/06/2020 Target Resolution Date: 11/06/2020 Goal Status: Met Patient/caregiver will maintain therapeutic glucose control Date Initiated: 09/22/2020 Target Resolution Date: 02/06/2021 Goal Status: Active Interventions: Assess HgA1c results as ordered upon admission and as needed Assess patient nutrition upon admission and as needed per policy Provide education on elevated blood sugars and impact on wound healing Provide education on nutrition Treatment Activities: Education provided on Nutrition : 10/06/2020 Notes: Electronic Signature(s) Signed: 01/15/2021 6:08:01 PM By: Shawn Stall Entered By: Shawn Stall on 01/15/2021 14:19:07 -------------------------------------------------------------------------------- Pain Assessment Details Patient Name: Date of Service: Shirlee More [facility] 01/15/2021 2:15 PM Medical Record Number: [id number] Patient Account Number: [id number] Date of  Birth/Sex: Treating  RN: 11/23/1976 (45 y.o. [name] Primary Care Cherrill Scrima: [name] Other Clinician: Referring Gloriann Riede: Treating Salimatou Simone/Extender: Mammie Russian, Ivan Anchors in Treatment: 16 Active Problems Location of Pain Severity and Description of Pain Patient Has Paino No Site Locations Pain Management and Medication Current Pain Management: Electronic Signature(s) Signed: 01/15/2021 5:58:12 PM By: Levan Hurst RN, BSN Entered By: Levan Hurst on 01/15/2021 14:45:43 -------------------------------------------------------------------------------- Patient/Caregiver Education Details Patient Name: Date of Service: [name] SHA 2/10/2022andnbsp2:15 PM Medical Record Number: [id number] Patient Account Number: [id number] Date of Birth/Gender: Treating RN: 01/18/1976 (45 y.o. [name] Primary Care Physician: [name] Other Clinician: Referring Physician: Treating Physician/Extender: Mammie Russian, Ivan Anchors in Treatment: 16 Education Assessment Education Provided To: Patient Education Topics Provided Elevated Blood Sugar/ Impact on Healing: Handouts: Elevated Blood Sugars: How Do They Affect Wound Healing Methods: Explain/Verbal Responses: Reinforcements needed Electronic Signature(s) Signed: 01/15/2021 6:08:01 PM By: Deon Pilling Entered By: Deon Pilling on 01/15/2021 14:19:20 -------------------------------------------------------------------------------- Wound Assessment Details Patient Name: Date of Service: [name] [facility] 01/15/2021 2:15 PM Medical Record Number: [id number] Patient Account Number: [id number] Date of Birth/Sex: Treating RN: 1976-01-08 (45 y.o. [name] Primary Care Latrisha Coiro: [name] Other Clinician: Referring Iriel Nason: Treating Debara Kamphuis/Extender: Mammie Russian, Ivan Anchors in Treatment: 16 Wound Status Wound Number: 4 Primary Diabetic  Wound/Ulcer of the Lower Extremity Etiology: Wound Location: Right Amputation Site - Transmetatarsal Wound Open Wounding Event: Gradually Appeared Status: Date Acquired: 03/06/2020 Comorbid Asthma, Chronic Obstructive Pulmonary Disease (COPD), Sleep Weeks Of Treatment: 16 History: Apnea, Congestive Heart Failure, Hypertension, Peripheral Venous Clustered Wound: No Disease, Type II Diabetes, Osteoarthritis, Osteomyelitis, Neuropathy Wound Measurements Length: (cm) 0.3 Width: (cm) 0.3 Depth: (cm) 0.1 Area: (cm) 0.071 Volume: (cm) 0.007 % Reduction in Area: 91.8% % Reduction in Volume: 97.3% Epithelialization: Large (67-100%) Tunneling: No Undermining: No Wound Description Classification: Grade 2 Wound Margin: Distinct, outline attached Exudate Amount: Medium Exudate Type: Serosanguineous Exudate Color: red, brown Foul Odor After Cleansing: No Slough/Fibrino Yes Wound Bed Granulation Amount: Large (67-100%) Exposed Structure Granulation Quality: Red Fascia Exposed: No Necrotic Amount: Small (1-33%) Fat Layer (Subcutaneous Tissue) Exposed: Yes Necrotic Quality: Adherent Slough Tendon Exposed: No Muscle Exposed: No Joint Exposed: No Bone Exposed: No Treatment Notes Wound #4 (Amputation Site - Transmetatarsal) Wound Laterality: Right Cleanser Soap and Water Discharge Instruction: May shower and wash wound with dial antibacterial soap and water prior to dressing change. Normal Saline Discharge Instruction: Cleanse the wound with Normal Saline prior to applying a clean dressing using gauze sponges, not tissue or cotton balls. Peri-Wound Care Topical Primary Dressing Hydrofera Blue Ready Foam, 4x5 in Discharge Instruction: Apply to wound bed as instructed. moisten with saline. Secondary Dressing Woven Gauze Sponge, Non-Sterile 4x4 in Discharge Instruction: Apply over primary dressing as directed. Secured With [employer], 4.5x3.1 (in/yd) Discharge  Instruction: Secure with Kerlix as directed. Paper Tape, 2x10 (in/yd) Discharge Instruction: Secure dressing with tape as directed. Compression Wrap Compression Stockings Add-Ons Electronic Signature(s) Signed: 01/15/2021 5:58:12 PM By: Levan Hurst RN, BSN Entered By: Levan Hurst on 01/15/2021 14:46:07 -------------------------------------------------------------------------------- Wound Assessment Details Patient Name: Date of Service: [name], [name] 01/15/2021 2:15 PM Medical Record Number: [id number] Patient Account Number: [id number] Date of Birth/Sex: Treating RN: Nov 29, 1976 (45 y.o. [name] Primary Care Clayborn Milnes: [name] Other Clinician: Referring Zahi Plaskett: Treating Hailynn Slovacek/Extender: Mammie Russian, Ivan Anchors in Treatment: 16 Wound Status Wound Number: 5 Primary Diabetic Wound/Ulcer of the Lower Extremity Etiology: Wound Location: Right, Posterior Lower Leg Wound  Open Wounding Event: Not Known Status: Date Acquired: 11/05/2020 Comorbid Asthma, Chronic Obstructive Pulmonary Disease (COPD), Sleep Weeks Of Treatment: 4 History: Apnea, Congestive Heart Failure, Hypertension, Peripheral Venous Clustered Wound: No Disease, Type II Diabetes, Osteoarthritis, Osteomyelitis, Neuropathy Wound Measurements Length: (cm) 1.8 Width: (cm) 2.3 Depth: (cm) 0.1 Area: (cm) 3.252 Volume: (cm) 0.325 % Reduction in Area: -43.8% % Reduction in Volume: -43.8% Epithelialization: Small (1-33%) Tunneling: No Undermining: No Wound Description Classification: Grade 2 Wound Margin: Flat and Intact Exudate Amount: Medium Exudate Type: Serosanguineous Exudate Color: red, brown Foul Odor After Cleansing: No Slough/Fibrino Yes Wound Bed Granulation Amount: Medium (34-66%) Exposed Structure Granulation Quality: Pink, Pale Fascia Exposed: No Necrotic Amount: Medium (34-66%) Fat Layer (Subcutaneous Tissue) Exposed: Yes Necrotic Quality: Adherent  Slough Tendon Exposed: No Muscle Exposed: No Joint Exposed: No Bone Exposed: No Treatment Notes Wound #5 (Lower Leg) Wound Laterality: Right, Posterior Cleanser Soap and Water Discharge Instruction: May shower and wash wound with dial antibacterial soap and water prior to dressing change. Wound Cleanser Discharge Instruction: Cleanse the wound with wound cleanser prior to applying a clean dressing using gauze sponges, not tissue or cotton balls. Peri-Wound Care Topical Primary Dressing Santyl Ointment Discharge Instruction: Apply nickel thick amount to wound bed as instructed Secondary Dressing ComfortFoam Border, 4x4 in (silicone border) Discharge Instruction: Apply over primary dressing as directed. Secured With Compression Wrap Compression Stockings Environmental education officer) Signed: 01/15/2021 5:58:12 PM By: Levan Hurst RN, BSN Entered By: Levan Hurst on 01/15/2021 14:46:30 -------------------------------------------------------------------------------- Vitals Details Patient Name: Date of Service: [name], [name] 01/15/2021 2:15 PM Medical Record Number: [id number] Patient Account Number: [id number] Date of Birth/Sex: Treating RN: 17-Apr-1976 (45 y.o. [name] Primary Care Mahad Newstrom: [name] Other Clinician: Referring [name] [name]: Treating Collen Hostler/Extender: Mammie Russian, Ivan Anchors in Treatment: 16 Vital Signs Time Taken: 14:44 Temperature (F): 97.9 Height (in): 62 Pulse (bpm): 96 Weight (lbs): 362 Respiratory Rate (breaths/min): 18 Body Mass Index (BMI): 66.2 Blood Pressure (mmHg): 130/85 Capillary Blood Glucose (mg/dl): 304 Reference Range: 80 - 120 mg / dl Notes glucose per pt report Electronic Signature(s) Signed: 01/15/2021 5:58:12 PM By: Levan Hurst RN, BSN Entered By: Levan Hurst on 01/15/2021 14:45:37

## 2021-01-16 NOTE — Telephone Encounter (Signed)
Pt informed. States she was only given 45 because you will have to do a PA. Anntonette Madewell Kennon Holter, CMA

## 2021-01-20 ENCOUNTER — Telehealth: Payer: Self-pay

## 2021-01-20 ENCOUNTER — Ambulatory Visit (INDEPENDENT_AMBULATORY_CARE_PROVIDER_SITE_OTHER): Payer: Medicaid Other | Admitting: Family Medicine

## 2021-01-20 ENCOUNTER — Other Ambulatory Visit: Payer: Self-pay

## 2021-01-20 ENCOUNTER — Encounter: Payer: Self-pay | Admitting: Family Medicine

## 2021-01-20 VITALS — BP 130/80 | HR 93 | Wt 361.6 lb

## 2021-01-20 DIAGNOSIS — IMO0002 Reserved for concepts with insufficient information to code with codable children: Secondary | ICD-10-CM

## 2021-01-20 DIAGNOSIS — E1151 Type 2 diabetes mellitus with diabetic peripheral angiopathy without gangrene: Secondary | ICD-10-CM | POA: Diagnosis not present

## 2021-01-20 DIAGNOSIS — E1165 Type 2 diabetes mellitus with hyperglycemia: Secondary | ICD-10-CM | POA: Diagnosis not present

## 2021-01-20 DIAGNOSIS — G8929 Other chronic pain: Secondary | ICD-10-CM

## 2021-01-20 DIAGNOSIS — Z89512 Acquired absence of left leg below knee: Secondary | ICD-10-CM

## 2021-01-20 DIAGNOSIS — I5033 Acute on chronic diastolic (congestive) heart failure: Secondary | ICD-10-CM | POA: Diagnosis not present

## 2021-01-20 DIAGNOSIS — E662 Morbid (severe) obesity with alveolar hypoventilation: Secondary | ICD-10-CM

## 2021-01-20 MED ORDER — TORSEMIDE 40 MG PO TABS: 80.0000 mg | ORAL_TABLET | Freq: Every day | ORAL | 1 refills | Status: DC

## 2021-01-20 NOTE — Patient Instructions (Signed)
Will check on gel overlay  Will refill medications  Referring back to physical therapy  Increase torsemide to 80mg  daily  Follow up on 3/8 as scheduled  Be well, Dr. [name]

## 2021-01-20 NOTE — Telephone Encounter (Signed)
Received fax from pharmacy, PA needed on Hydrocodone-Acetaminophen. Clinical questions submitted via Cover My Meds. Waiting on response, could take up to 72 hours.  Cover My Meds info: Key: [id number]

## 2021-01-20 NOTE — Progress Notes (Signed)
  Date of Visit: 01/20/2021   SUBJECTIVE:   HPI:  [name] presents today for routine follow up.  Sleep issues - having trouble with sleeping at night. Wearing oxygen and bipap at home at night. Previously was on 2L O2, but she kept waking up with chest pain the last few days so she decreased her oxygen supplementation to 1.5L at night because she thought maybe she was getting too much oxygen causing her chest pain. Since doing that, feels more shortness of breath. Taking torsemide 60mg  daily and has noticed decreased urine output lately. No increase in swelling.  Chronic pain - taking norco 10-325mg  three times daily. Tolerating well. No adverse issues. Helps her be functional. Storing it safely, no issues with misuse. Has had increased pain lately due to being more active. Needs another referral to physical therapy.   Diabetes - saw endocrinologist Dr. Hartford Poli at [facility] - will be doing new regimen of humulin U500 100units before breakfast, 90u before lunch, 90u before dinner. Has not yet started this regimen but is planning to begin it today or tomorrow.  Also needs new gel overlay mattress, has not yet heard about this from [facility] agency.   OBJECTIVE:   BP 130/80   Pulse 93   Wt (!) 361 lb 9.6 oz (164 kg)   SpO2 94%   BMI 66.14 kg/m  Gen: no acute distress, pleasant, cooperative HEENT: normocephalic, atraumatic  Heart: regular rate and rhythm, no murmur Lungs: clear to auscultation bilaterally, normal work of breathing, no crackles, on room air Neuro: alert, speech normal, grossly nonfocal Ext: s/p BKA LLE. RLE with 1+ pitting edema  ASSESSMENT/PLAN:   Health maintenance:  -reminded due for eye exam in May  Obesity hypoventilation syndrome ([place]) Recent increase in respiratory symptoms after decreasing home nocturnal O2 with bipap. I am not sure what to make of her morning chest pain - she attributes it to possibly having too much oxygen at night. She has had a recent heart cath and is  followed by cardiology. No hypoxia today, and lungs are clear with normal respiratory effort.  Since she notes decreased urine output lately, will increase torsemide to 80mg  daily and have her follow up in a few weeks to recheck labs and symptoms.   Encounter for chronic pain management Pain overall stable. Benefits of continuing therapy outweigh risks. PDMP reviewed with appropriate findings. Will refill x3 months.   DM (diabetes mellitus) type II uncontrolled, periph vascular disorder [facility]) Now seeing endocrinology - appreciate their care  Gel overlay mattress ordered - will message [name] RN team to coordinate with [facility] agency  Refer back to physical therapy   FOLLOW UP: Follow up in 2 weeks for above issues  [name], [place]

## 2021-01-21 NOTE — Telephone Encounter (Signed)
Medication approved. The pharmacy has been updated (yesterday.)

## 2021-01-23 ENCOUNTER — Other Ambulatory Visit (HOSPITAL_COMMUNITY): Payer: Medicaid Other

## 2021-01-25 MED ORDER — HYDROCODONE-ACETAMINOPHEN 10-325 MG PO TABS
1.0000 | ORAL_TABLET | Freq: Three times a day (TID) | ORAL | 0 refills | Status: DC | PRN
Start: 2021-01-25 — End: 2021-03-25

## 2021-01-25 MED ORDER — HYDROCODONE-ACETAMINOPHEN 10-325 MG PO TABS: 1.0000 | ORAL_TABLET | Freq: Three times a day (TID) | ORAL | 0 refills | Status: DC | PRN

## 2021-01-25 NOTE — Assessment & Plan Note (Signed)
Pain overall stable. Benefits of continuing therapy outweigh risks. PDMP reviewed with appropriate findings. Will refill x3 months.

## 2021-01-25 NOTE — Assessment & Plan Note (Signed)
Recent increase in respiratory symptoms after decreasing home nocturnal O2 with bipap. I am not sure what to make of her morning chest pain - she attributes it to possibly having too much oxygen at night. She has had a recent heart cath and is followed by cardiology. No hypoxia today, and lungs are clear with normal respiratory effort.  Since she notes decreased urine output lately, will increase torsemide to 80mg  daily and have her follow up in a few weeks to recheck labs and symptoms.

## 2021-01-25 NOTE — Assessment & Plan Note (Signed)
Now seeing endocrinology - appreciate their care

## 2021-01-25 NOTE — Assessment & Plan Note (Signed)
>>  ASSESSMENT AND PLAN FOR INSULIN DEPENDENT TYPE 2 DIABETES MELLITUS ([name]) WRITTEN ON 01/25/2021 11:27 AM BY Carlisha Wisler, [name], MD  Now seeing endocrinology - appreciate their care

## 2021-01-26 ENCOUNTER — Ambulatory Visit: Payer: Medicaid Other | Admitting: Internal Medicine

## 2021-01-26 ENCOUNTER — Ambulatory Visit (HOSPITAL_COMMUNITY): Admission: RE | Admit: 2021-01-26 | Payer: Medicaid Other | Source: Ambulatory Visit

## 2021-01-27 ENCOUNTER — Encounter (HOSPITAL_COMMUNITY): Payer: Self-pay

## 2021-01-29 ENCOUNTER — Encounter (HOSPITAL_BASED_OUTPATIENT_CLINIC_OR_DEPARTMENT_OTHER): Payer: Medicaid Other | Admitting: Internal Medicine

## 2021-02-03 DIAGNOSIS — G4733 Obstructive sleep apnea (adult) (pediatric): Secondary | ICD-10-CM | POA: Diagnosis not present

## 2021-02-03 DIAGNOSIS — R32 Unspecified urinary incontinence: Secondary | ICD-10-CM | POA: Diagnosis not present

## 2021-02-03 DIAGNOSIS — M86172 Other acute osteomyelitis, left ankle and foot: Secondary | ICD-10-CM | POA: Diagnosis not present

## 2021-02-03 DIAGNOSIS — E669 Obesity, unspecified: Secondary | ICD-10-CM | POA: Diagnosis not present

## 2021-02-03 DIAGNOSIS — J45909 Unspecified asthma, uncomplicated: Secondary | ICD-10-CM | POA: Diagnosis not present

## 2021-02-06 ENCOUNTER — Other Ambulatory Visit: Payer: Self-pay

## 2021-02-06 ENCOUNTER — Other Ambulatory Visit (HOSPITAL_COMMUNITY)
Admission: RE | Admit: 2021-02-06 | Discharge: 2021-02-06 | Disposition: A | Discharge status: Home / Self Care | Payer: Medicaid Other | Source: Other Acute Inpatient Hospital | Attending: Physician Assistant | Admitting: Physician Assistant

## 2021-02-06 ENCOUNTER — Encounter (HOSPITAL_BASED_OUTPATIENT_CLINIC_OR_DEPARTMENT_OTHER): Payer: Medicaid Other | Attending: Internal Medicine | Admitting: Physician Assistant

## 2021-02-06 DIAGNOSIS — E11622 Type 2 diabetes mellitus with other skin ulcer: Secondary | ICD-10-CM | POA: Insufficient documentation

## 2021-02-06 DIAGNOSIS — L97512 Non-pressure chronic ulcer of other part of right foot with fat layer exposed: Secondary | ICD-10-CM | POA: Diagnosis not present

## 2021-02-06 DIAGNOSIS — L97212 Non-pressure chronic ulcer of right calf with fat layer exposed: Secondary | ICD-10-CM | POA: Diagnosis not present

## 2021-02-06 DIAGNOSIS — Z89431 Acquired absence of right foot: Secondary | ICD-10-CM | POA: Diagnosis not present

## 2021-02-06 DIAGNOSIS — L089 Local infection of the skin and subcutaneous tissue, unspecified: Secondary | ICD-10-CM | POA: Diagnosis not present

## 2021-02-06 DIAGNOSIS — Z89512 Acquired absence of left leg below knee: Secondary | ICD-10-CM | POA: Insufficient documentation

## 2021-02-06 DIAGNOSIS — E11621 Type 2 diabetes mellitus with foot ulcer: Secondary | ICD-10-CM | POA: Insufficient documentation

## 2021-02-06 DIAGNOSIS — E1151 Type 2 diabetes mellitus with diabetic peripheral angiopathy without gangrene: Secondary | ICD-10-CM | POA: Diagnosis not present

## 2021-02-06 DIAGNOSIS — I5032 Chronic diastolic (congestive) heart failure: Secondary | ICD-10-CM | POA: Diagnosis not present

## 2021-02-06 DIAGNOSIS — L97218 Non-pressure chronic ulcer of right calf with other specified severity: Secondary | ICD-10-CM | POA: Diagnosis not present

## 2021-02-06 DIAGNOSIS — I110 Hypertensive heart disease with heart failure: Secondary | ICD-10-CM | POA: Insufficient documentation

## 2021-02-06 DIAGNOSIS — Z6841 Body Mass Index (BMI) 40.0 and over, adult: Secondary | ICD-10-CM | POA: Insufficient documentation

## 2021-02-06 DIAGNOSIS — J449 Chronic obstructive pulmonary disease, unspecified: Secondary | ICD-10-CM | POA: Diagnosis not present

## 2021-02-06 NOTE — Progress Notes (Addendum)
ATLANTA, PELTO ([id number]) Visit Report for 02/06/2021 Chief Complaint Document Details Patient Name: Date of Service: [name] [facility] 02/06/2021 1:00 PM Medical Record Number: [id number] Patient Account Number: [id number] Date of Birth/Sex: Treating RN: Jul 30, 1976 (45 y.o. [name] Primary Care Provider: [name] Other Clinician: Referring Provider: Treating Provider/Extender: Towanda Malkin, [place] Weeks in Treatment: 19 Information Obtained from: Patient Chief Complaint Right buttock, right foot amputation site, and LLQ abdomen ulcers 09/22/2020; patient returns to clinic with a wound on her right medial foot in the setting of a transmetatarsal amputation Electronic Signature(s) Signed: 02/06/2021 1:25:38 PM By: Belinda Keeler PA-C Entered By: [name] on 02/06/2021 13:25:38 -------------------------------------------------------------------------------- HPI Details Patient Name: Date of Service: [name] 02/06/2021 1:00 PM Medical Record Number: [id number] Patient Account Number: [id number] Date of Birth/Sex: Treating RN: Sep 01, 1976 (45 y.o. [name] Primary Care Provider: [name] Other Clinician: Referring Provider: Treating Provider/Extender: Towanda Malkin, [place] Weeks in Treatment: 19 History of Present Illness HPI Description: 05/30/19 patient presents today for initial evaluation our clinic concerning significant wounds that she has in the right gluteal region as well as in the left abdominal area. She also has a pressure injury to her trans metatarsal amputation site on the right which does not appear to be as looking at this time but nonetheless does need to close. This occurred while she was in the hospital. The patient was admitted to the hospital on 05/17/19 discharged on 05/25/19. This was due to the significant abscess in the right gluteal region as well as in the left panus region. She has a history of  diabetes which is definitely not under good control of the most useful view of an agency being 17.5. She also has hypertension, COPD, and is morbidly obese. Unfortunately the wound and the right gluteal region extends right up and adjacent to the anus which is going to make the Wound VAC impossible at this point. Nonetheless I think that we're gonna have to basically pack this with wet to dry packing at this time in order to hopefully achieve improvement. Nonetheless this is a significant wound with her diabetes be not under control along with her size and again the nature of the wound I think this is gonna take a very long time to heal. 07/04/2019 upon evaluation today patient actually appears to be doing better in regard to her wounds. Her foot ulcer actually appears to be completely healed which is great the abdominal ulcer has filled in quite nicely and seems to be doing well although there is a lot of moisture I think that she would benefit from a silver alginate dressing. Nonetheless in regard to the wound on her right gluteal region this showed signs of improvement today still this is a fairly significant ulcer unfortunately. 08/01/2019 on evaluation patient presents for follow-up concerning her wounds over the right gluteal region as well as the left abdominal region. Fortunately both areas are showing signs of improvement which is good news. In fact significant improvement. Spent about a month since have seen her last. Overall I am extremely happy though with the findings of how the wounds have progressed at this point. READMISSION 09/22/2020 This is a now 45 year old woman previously in this clinic for a right gluteal abscess for 3 or 4 visits in 2020. She is a poorly controlled type II diabetic with the last hemoglobin A1c I see in epic at 12.7 in July 2021. She tells [place] that she  is here for review of a wound on the tip of her right first metatarsal transmetatarsal amputation. She had an  angiogram and angioplasty on 12/20/2018 with angioplasty of her anterior tibial and posterior tibial arteries. She has been followed by Dr. Doran Durand most recent treatment since August I think has been Santyl. She was given a 1 week course of Keflex in mid September. The patient's had an x-ray and also an MRI in August 2021 that did not show osteomyelitis but edema ABI in our clinic was 1.19 Past medical history includes hypertension, hyperlipidemia, COPD, bipolar disorder, poorly controlled type 2 diabetes with PAD. Left BKA, right transmetatarsal amputation, heart failure with preserved ejection fraction, she is minimally I am ambulatory walking only from her wheelchair to the bathroom with her walker. She only wears a stocking on her foot. Otherwise she is in wheelchair or in bed all day long. 11/1; patient readmitted to the clinic 2 weeks ago. She has a wound on the tip of the right first metatarsal transmetatarsal amputation site she has been using Santyl. She has Medicaid and we have to apply for assistance if we are going to continue with Santyl although I am hopeful that we will be able to change from the [facility] next week. 11/9; area on the tip of the right first metatarsal amputation site. She has been using Santyl. She was in the hospital overnight for chest pain and they gave her another tube of Santyl which is nice since she has Medicaid and Medicaid would not pay for that. Her wound is smaller 12/2; tip of the right first metatarsal amputation site. She has been using Santyl. No changes wound dimension however I think the surface of the wound is better 12/28; patient has not been here in 3 and half weeks I think transportation issues. We gave her Hydrofera Blue last time however she switched back to [place] last week because somebody told her the wound was larger. The wound is actually measuring smaller and looks healthy. 1/13; since the patient was last here she was actually seen twice in  the emergency department most recently on 12/30 this was for a painful small area on the right posterior calf superiorly. Previously she was seen in the ER on 12/19 for this problem but she did not tell us about this the last time she was here. There was some question of this being a spider bite based I think on somebody killing a spider in her apartment. When she was in the ER on 12/30 she complained of increasing pain and swelling. She had previously been given doxycycline but they treated with IV clindamycin transitioning to oral clindamycin. She is still using Hydrofera Blue to the area on the medial transmit amputation on the right this is not changed much today 1/27; 2-week follow-up. The patient has Hydrofera Blue on the medial transmet amputation site. This looks better this week after debridement last week. She has a new area on the right posterior calf that we looked at last time she has been using Santyl to this that she had leftover from the days where Medicaid paid for this. 2/3; she did not tolerate the Iodoflex well on the back of her leg although the surface of it looks a lot better today. She was approved for Santyl to this area through compassionate release. We are using Hydrofera Blue on the transmet wound on the first met head 2/10; we have been using Santyl on the posterior right leg and the wound on  the medial first met head. 02/06/2021 upon evaluation today patient appears to be doing poorly in regard to her posterior leg as well as her foot ulcer. Both are showing signs of erythema and potentially infection at this point as well. There does not appear to be any signs of active infection locally that is a different story. There does not appear to be any signs of overall worsening with regard to the wounds but they are also not doing quite as well as it appears it was previous. I think that the [facility] however is a better dressing probably we can switch to using this on the  posterior leg as well. Collagen may be appropriate at some point but I do not think were quite there yet. Electronic Signature(s) Signed: 02/06/2021 2:17:01 PM By: Belinda Keeler PA-C Entered By: [name] on 02/06/2021 14:17:01 -------------------------------------------------------------------------------- Physical Exam Details Patient Name: Date of Service: [name] [facility] 02/06/2021 1:00 PM Medical Record Number: [id number] Patient Account Number: [id number] Date of Birth/Sex: Treating RN: [birth year] (45 y.o. [name] Primary Care Provider: [name] Other Clinician: Referring Provider: Treating Provider/Extender: Towanda Malkin, [place] Weeks in Treatment: 19 Constitutional Obese and well-hydrated in no acute distress. Respiratory normal breathing without difficulty. Psychiatric this patient is able to make decisions and demonstrates good insight into disease process. Alert and Oriented x 3. pleasant and cooperative. Notes Patient's wound beds both were erythematous around and painful to touch there was some blue-green drainage from the leg in particular. With that being said I do believe that the patient would benefit from looking into getting a antibiotic going and at this point I think that possibly using the [facility] but starting her on Levaquin would be an appropriate way to go assuming there is no significant interactions. Electronic Signature(s) Signed: 02/06/2021 2:17:31 PM By: Belinda Keeler PA-C Entered By: [name] on 02/06/2021 14:17:31 -------------------------------------------------------------------------------- Physician Orders Details Patient Name: Date of Service: Belinda Wilder Glade, TA Hall 02/06/2021 1:00 PM Medical Record Number: [id number] Patient Account Number: [id number] Date of Birth/Sex: Treating RN: 1976/04/22 (45 y.o. 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 Bathing/ Shower/ Hygiene May shower with protection but do not get wound dressing(s) wet. - use a cast protector when not changing the dressing. May shower and wash wound with soap and water. - with dressing changes only. Edema Control - Lymphedema / SCD / Other Right Lower Extremity Elevate legs to the level of the heart or above for 30 minutes daily and/or when sitting, a frequency of: - 3-4 times throughout the day. Avoid standing for long periods of time. Exercise regularly - may wear diabetic shoes now, may restart physical therapy, may start walking. Just ensure the filler in the diabetic shoe does not rub on the wound area. Moisturize legs daily. Off-Loading Other: - ensure no pressure to foot wound. Wound Treatment Wound #4 - Amputation Site - Transmetatarsal Wound Laterality: Right Cleanser: Soap and Water Every Other Day/30 Days  Discharge Instructions: May shower and wash wound with dial antibacterial soap and water prior to dressing change. Cleanser: Normal Saline (Generic) Every Other Day/30 Days Discharge Instructions: Cleanse the wound with Normal Saline prior to applying a clean dressing using gauze sponges, not tissue or cotton balls. Prim Dressing: Hydrofera Blue Ready Foam, 4x5 in Every Other Day/30 Days ary Discharge Instructions: Apply to wound bed as instructed. moisten with saline. Secondary  Dressing: Woven Gauze Sponge, Non-Sterile 4x4 in (Generic) Every Other Day/30 Days Discharge Instructions: Apply over primary dressing as directed. Secured With: [employer], 4.5x3.1 (in/yd) (Generic) Every Other Day/30 Days Discharge Instructions: Secure with Kerlix as directed. Secured With: Paper Tape, 2x10 (in/yd) (Generic) Every Other Day/30 Days Discharge Instructions: Secure dressing with tape as directed. Wound #5 - Lower Leg Wound Laterality: Right, Posterior Cleanser: Soap and Water 1 x Per Day/30 Days Discharge Instructions: May shower and wash wound with dial antibacterial soap and water prior to dressing change. Cleanser: Wound Cleanser 1 x Per Day/30 Days Discharge Instructions: Cleanse the wound with wound cleanser prior to applying a clean dressing using gauze sponges, not tissue or cotton balls. Prim Dressing: Hydrofera Blue Classic Foam, 4x4 in 1 x Per Day/30 Days ary Discharge Instructions: Moisten with saline prior to applying to wound bed Secondary Dressing: Woven Gauze Sponge, Non-Sterile 4x4 in 1 x Per Day/30 Days Discharge Instructions: Apply over primary dressing as directed. Secured With: 78M Medipore H Soft Cloth Surgical T 4 x 2 (in/yd) 1 x Per Day/30 Days ape Discharge Instructions: Secure dressing with tape as directed. Laboratory naerobe culture (MICRO) - right posterior lower leg Bacteria identified in Unspecified specimen by A LOINC Code: 947-6 Convenience Name: Anerobic culture Patient Medications llergies: cefepime, Trental, Nubain, kiwi, morphine A Notifications Medication Indication Start End 02/06/2021 Levaquin DOSE 1 - oral 500 mg tablet - 1 tablet oral taken 1 time per day for 14 days Electronic Signature(s) Signed: 02/06/2021 2:20:54 PM By: Belinda Keeler PA-C Entered By: [name] on 02/06/2021 14:20:53 -------------------------------------------------------------------------------- Problem List Details Patient Name: Date of  Service: [name], TA Hall 02/06/2021 1:00 PM Medical Record Number: [id number] Patient Account Number: [id number] Date of Birth/Sex: Treating RN: [birth year] (45 y.o. 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 [name] Primary Care Provider: [name] Other Clinician: Referring Provider: Treating Provider/Extender: Towanda Malkin, Ivan Anchors in Treatment: 19 Subjective Chief Complaint Information obtained from Patient Right buttock, right foot amputation site, and LLQ abdomen ulcers 09/22/2020; patient returns to clinic with a wound on her right medial foot in the setting of a transmetatarsal amputation History of Present Illness (HPI) 05/30/19  patient presents today for initial evaluation our clinic concerning significant wounds that she has in the right gluteal  region as well as in the left abdominal area. She also has a pressure injury to her trans metatarsal amputation site on the right which does not appear to be as looking at this time but nonetheless does need to close. This occurred while she was in the hospital. The patient was admitted to the hospital on 05/17/19 discharged on 05/25/19. This was due to the significant abscess in the right gluteal region as well as in the left panus region. She has a history of diabetes which is definitely not under good control of the most useful view of an agency being 17.5. She also has hypertension, COPD, and is morbidly obese. Unfortunately the wound and the right gluteal region extends right up and adjacent to the anus which is going to make the Wound VAC impossible at this point. Nonetheless I think that we're gonna have to basically pack this with wet to dry packing at this time in order to hopefully achieve improvement. Nonetheless this is a significant wound with her diabetes be not under control along with her size and again the nature of the wound I think this is gonna take a very long time to heal. 07/04/2019 upon evaluation today patient actually appears to be doing better in regard to her wounds. Her foot ulcer actually appears to be completely healed which is great the abdominal ulcer has filled in quite nicely and seems to be doing well although there is a lot of moisture I think that she would benefit from a silver alginate dressing. Nonetheless in regard to the wound on her right gluteal region this showed signs of improvement today still this is a fairly significant ulcer unfortunately. 08/01/2019 on evaluation patient presents for follow-up concerning her wounds over the right gluteal region as well as the left abdominal region. Fortunately both areas are showing signs of improvement which is good news. In fact significant improvement. Spent about a month since have seen her last. Overall I  am extremely happy though with the findings of how the wounds have progressed at this point. READMISSION 09/22/2020 This is a now 45 year old woman previously in this clinic for a right gluteal abscess for 3 or 4 visits in 2020. She is a poorly controlled type II diabetic with the last hemoglobin A1c I see in epic at 12.7 in July 2021. She tells [place] that she is here for review of a wound on the tip of her right first metatarsal transmetatarsal amputation. She had an angiogram and angioplasty on 12/20/2018 with angioplasty of her anterior tibial and posterior tibial arteries. She has been followed by Dr. Doran Durand most recent treatment since August I think has been Santyl. She was given a 1 week course of Keflex in mid September. The patient's had an x-ray and also an MRI in August 2021 that did not show osteomyelitis but edema ABI in our clinic was 1.19 Past medical history includes hypertension, hyperlipidemia, COPD, bipolar disorder, poorly controlled type 2 diabetes with PAD. Left BKA, right transmetatarsal amputation, heart failure with preserved ejection fraction, she is minimally I am ambulatory walking only from her wheelchair to the bathroom with her walker. She only wears a stocking on her foot. Otherwise she is in wheelchair or in bed all day long. 11/1; patient readmitted to the clinic 2 weeks ago. She has a wound on the tip of the right first metatarsal transmetatarsal amputation site she  has been using Santyl. She has Medicaid and we have to apply for assistance if we are going to continue with Santyl although I am hopeful that we will be able to change from the [facility] next week. 11/9; area on the tip of the right first metatarsal amputation site. She has been using Santyl. She was in the hospital overnight for chest pain and they gave her another tube of Santyl which is nice since she has Medicaid and Medicaid would not pay for that. Her wound is smaller 12/2; tip of the right first  metatarsal amputation site. She has been using Santyl. No changes wound dimension however I think the surface of the wound is better 12/28; patient has not been here in 3 and half weeks I think transportation issues. We gave her Hydrofera Blue last time however she switched back to [place] last week because somebody told her the wound was larger. The wound is actually measuring smaller and looks healthy. 1/13; since the patient was last here she was actually seen twice in the emergency department most recently on 12/30 this was for a painful small area on the right posterior calf superiorly. Previously she was seen in the ER on 12/19 for this problem but she did not tell us about this the last time she was here. There was some question of this being a spider bite based I think on somebody killing a spider in her apartment. When she was in the ER on 12/30 she complained of increasing pain and swelling. She had previously been given doxycycline but they treated with IV clindamycin transitioning to oral clindamycin. She is still using Hydrofera Blue to the area on the medial transmit amputation on the right this is not changed much today 1/27; 2-week follow-up. The patient has Hydrofera Blue on the medial transmet amputation site. This looks better this week after debridement last week. She has a new area on the right posterior calf that we looked at last time she has been using Santyl to this that she had leftover from the days where Medicaid paid for this. 2/3; she did not tolerate the Iodoflex well on the back of her leg although the surface of it looks a lot better today. She was approved for Santyl to this area through compassionate release. We are using Hydrofera Blue on the transmet wound on the first met head 2/10; we have been using Santyl on the posterior right leg and the wound on the medial first met head. 02/06/2021 upon evaluation today patient appears to be doing poorly in regard to her  posterior leg as well as her foot ulcer. Both are showing signs of erythema and potentially infection at this point as well. There does not appear to be any signs of active infection locally that is a different story. There does not appear to be any signs of overall worsening with regard to the wounds but they are also not doing quite as well as it appears it was previous. I think that the [facility] however is a better dressing probably we can switch to using this on the posterior leg as well. Collagen may be appropriate at some point but I do not think were quite there yet. Objective Constitutional Obese and well-hydrated in no acute distress. Vitals Time Taken: 1:17 PM, Height: 62 in, Weight: 362 lbs, BMI: 66.2, Temperature: 98.1 F, Pulse: 94 bpm, Respiratory Rate: 17 breaths/min, Blood Pressure: 74/45 mmHg. General Notes: manual BP 118/90 Respiratory normal breathing without difficulty. Psychiatric this  patient is able to make decisions and demonstrates good insight into disease process. Alert and Oriented x 3. pleasant and cooperative. General Notes: Patient's wound beds both were erythematous around and painful to touch there was some blue-green drainage from the leg in particular. With that being said I do believe that the patient would benefit from looking into getting a antibiotic going and at this point I think that possibly using the [facility] but starting her on Levaquin would be an appropriate way to go assuming there is no significant interactions. Integumentary (Hair, Skin) Wound #4 status is Open. Original cause of wound was Gradually Appeared. The date acquired was: 03/06/2020. The wound has been in treatment 19 weeks. 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 The wound measures 0.7cm length x 1cm width x 0.2cm depth; 0.55cm^2 area and 0.11cm^3 volume. There is Fat Layer (Subcutaneous Tissue) exposed. There is no tunneling or undermining  noted. There is a medium amount of serosanguineous drainage noted. The wound margin is distinct with the outline attached to the wound base. There is large (67-100%) red granulation within the wound bed. There is a small (1-33%) amount of necrotic tissue within the wound bed including Adherent Slough. General Notes: Periwound erythema Wound #5 status is Open. Original cause of wound was Not Known. The date acquired was: 11/05/2020. The wound has been in treatment 7 weeks. The wound is located on the Right,Posterior Lower Leg. The wound measures 1.5cm length x 2cm width x 0.3cm depth; 2.356cm^2 area and 0.707cm^3 volume. There is Fat Layer (Subcutaneous Tissue) exposed. There is no tunneling or undermining noted. There is a medium amount of purulent drainage noted. The wound margin is flat and intact. There is small (1-33%) pink, pale granulation within the wound bed. There is a large (67-100%) amount of necrotic tissue within the wound bed including Adherent Slough. 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 Bathing/ Shower/ Hygiene: May shower with protection but do not get wound dressing(s) wet. - use a cast protector when not changing the dressing. May shower and wash wound with soap and water. - with dressing changes only. Edema Control - Lymphedema / SCD / Other: Elevate legs to the level of the heart or above for 30 minutes daily and/or when sitting, a frequency of: - 3-4 times throughout the day. Avoid standing for long periods of time. Exercise regularly - may wear diabetic shoes now, may restart  physical therapy, may start walking. Just ensure the filler in the diabetic shoe does not rub on the wound area. Moisturize legs daily. Off-Loading: Other: - ensure no pressure to foot wound. Laboratory ordered were: Anerobic culture - right posterior lower leg The following medication(s) was prescribed: Levaquin oral 500 mg tablet 1 1 tablet oral taken 1 time per day for 14 days starting 02/06/2021 WOUND #4: - Amputation Site - Transmetatarsal Wound Laterality: Right Cleanser: Soap and Water Every Other Day/30 Days Discharge Instructions: May shower and wash wound with dial antibacterial soap and water prior to dressing change. Cleanser: Normal Saline (Generic) Every Other Day/30 Days Discharge Instructions: Cleanse the wound with Normal Saline prior to applying a clean dressing using gauze  sponges, not tissue or cotton balls. Prim Dressing: Hydrofera Blue Ready Foam, 4x5 in Every Other Day/30 Days ary Discharge Instructions: Apply to wound bed as instructed. moisten with saline. Secondary Dressing: Woven Gauze Sponge, Non-Sterile 4x4 in (Generic) Every Other Day/30 Days Discharge Instructions: Apply over primary dressing as directed. Secured With: [employer], 4.5x3.1 (in/yd) (Generic) Every Other Day/30 Days Discharge Instructions: Secure with Kerlix as directed. Secured With: Paper Tape, 2x10 (in/yd) (Generic) Every Other Day/30 Days Discharge Instructions: Secure dressing with tape as directed. WOUND #5: - Lower Leg Wound Laterality: Right, Posterior Cleanser: Soap and Water 1 x Per Day/30 Days Discharge Instructions: May shower and wash wound with dial antibacterial soap and water prior to dressing change. Cleanser: Wound Cleanser 1 x Per Day/30 Days Discharge Instructions: Cleanse the wound with wound cleanser prior to applying a clean dressing using gauze sponges, not tissue or cotton balls. Prim Dressing: Hydrofera Blue Classic Foam, 4x4 in 1 x Per Day/30  Days ary Discharge Instructions: Moisten with saline prior to applying to wound bed Secondary Dressing: Woven Gauze Sponge, Non-Sterile 4x4 in 1 x Per Day/30 Days Discharge Instructions: Apply over primary dressing as directed. Secured With: 22M Medipore H Soft Cloth Surgical T 4 x 2 (in/yd) 1 x Per Day/30 Days ape Discharge Instructions: Secure dressing with tape as directed. 1. Would recommend currently that we going continue with the wound care measures as before and the patient is in agreement the plan this includes the use of Levaquin to try to help treat the active cellulitis that seems to be present again both wounds are tender to touch with erythematous tissue noted around the open wounds. There is blue-green drainage as well from the posterior leg. 2. I am also can recommend that the patient continue with [facility] ready to use this on both wounds at this point. 3. I am also can recommend the patient continue to elevate her legs much as possible to help with edema control. 4. We will go ahead and see how things do with regard to the treatment with the Levaquin currently but I did obtain a wound culture to evaluate and see if there is anything that were missing that may need to be addressed once we get the results of that back but possibly Levaquin will take care of. We will see patient back for reevaluation in 2 weeks here in the clinic. If anything worsens or changes patient will contact our office for additional recommendations. Electronic Signature(s) Signed: 02/06/2021 2:21:57 PM By: Belinda Keeler PA-C Entered By: [name] on 02/06/2021 14:21:57 -------------------------------------------------------------------------------- SuperBill Details Patient Name: Date of Service: [name], TA [facility] 02/06/2021 Medical Record Number: [id number] Patient Account Number: [id number] Date of Birth/Sex: Treating RN: December 04, 1976 (45 y.o. [name] Primary Care Provider: [name] Other Clinician: Referring Provider: Treating Provider/Extender: Towanda Malkin, [place] Weeks in Treatment: 19 Diagnosis Coding ICD-10 Codes Code Description E11.621 Type 2 diabetes mellitus with foot ulcer L97.512 Non-pressure chronic ulcer of other part of right foot with fat layer exposed Z89.512 Acquired absence of left leg below knee E11.51 Type 2 diabetes mellitus with diabetic peripheral angiopathy without gangrene L97.218 Non-pressure chronic ulcer of right calf with other specified severity Facility Procedures CPT4 Code: [id number] Description: 99214 - WOUND CARE VISIT-LEV 4 EST PT Modifier: Quantity: 1 Physician Procedures : CPT4 Code Description Modifier [id number] - WC PHYS LEVEL 4 - EST PT ICD-10 Diagnosis Description E11.621 Type 2 diabetes mellitus with foot ulcer L97.512  Non-pressure chronic ulcer of other part of right foot with fat layer exposed Z89.512 Acquired  absence of left leg below knee E11.51 Type 2 diabetes mellitus with diabetic peripheral angiopathy without gangrene Quantity: 1 Electronic Signature(s) Signed: 02/06/2021 2:22:20 PM By: Belinda Keeler PA-C Entered By: [name] on 02/06/2021 14:22:19

## 2021-02-09 LAB — AEROBIC CULTURE W GRAM STAIN (SUPERFICIAL SPECIMEN)

## 2021-02-09 IMAGING — DX DG CHEST 1V PORT
1 series · 1 of 1 positions shown · non-contrast
Comparison: Chest radiograph 05/13/2019

CLINICAL DATA: Hyperglycemia.

EXAM:
PORTABLE CHEST 1 VIEW

[chest ap]
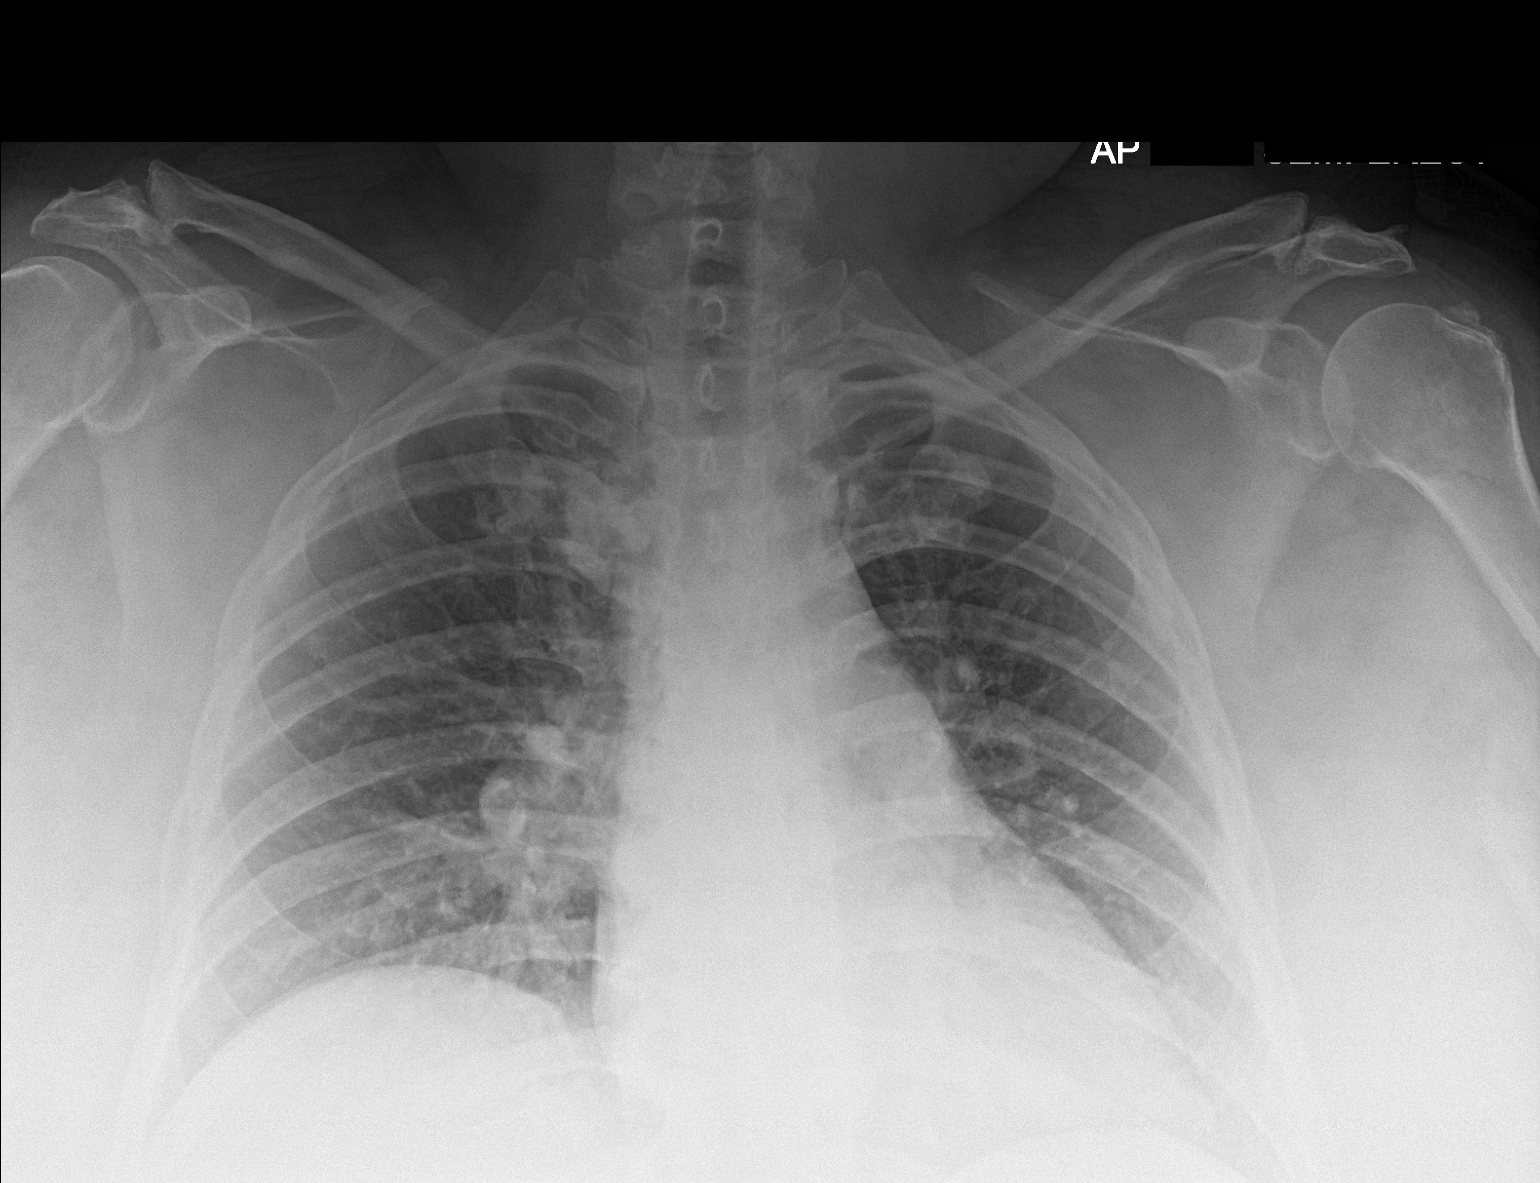

[1 of 1 positions shown; findings below may reference images not displayed]

FINDINGS: Heart size within normal limits. Shallow inspiration radiograph with
crowding of the central bronchovascular markings. No evidence of
airspace disease. No evidence of pleural effusion, although the left
lateral costophrenic angle is partially excluded from the field of
view. No pneumothorax. No acute bony abnormality.
IMPRESSION: Shallow inspiration radiograph. No evidence of acute cardiopulmonary
abnormality.

## 2021-02-09 NOTE — Progress Notes (Addendum)
[name] ([id number]) Visit Report for 02/06/2021 Arrival Information Details Patient Name: Date of Service: [name] [facility] 02/06/2021 1:00 PM Medical Record Number: [id number] Patient Account Number: [id number] Date of Birth/Sex: Treating RN: 22-May-1976 (45 y.o. [name] Primary Care [name]: [name] Other Clinician: Referring [name]: Treating [name]/Extender: [name], [name] in Treatment: 67 Visit Information History Since Last Visit Added or deleted any medications: No Patient Arrived: Wheel Chair Any new allergies or adverse reactions: No Arrival Time: 13:16 Had a fall or experienced change in No Accompanied By: self activities of daily living that may affect Transfer Assistance: None risk of falls: Patient Identification Verified: Yes Signs or symptoms of abuse/neglect since last visito No Secondary Verification Process Completed: Yes Hospitalized since last visit: No Patient Requires Transmission-Based Precautions: No Implantable device outside of the clinic excluding No Patient Has Alerts: No cellular tissue based products placed in the center since last visit: Has Dressing in Place as Prescribed: Yes Pain Present Now: Yes Electronic Signature(s) Signed: 02/09/2021 5:34:21 PM By: Belinda Hammock RN Entered By: [name] on 02/06/2021 13:17:36 -------------------------------------------------------------------------------- Clinic Level of Care Assessment Details Patient Name: Date of Service: [name] [facility] 02/06/2021 1:00 PM Medical Record Number: [id number] Patient Account Number: [id number] Date of Birth/Sex: Treating RN: 1976/03/05 (45 y.o. [name] Primary Care [name]: [name] Other Clinician: Referring [name]: Treating [name]/Extender: [name], [name] in Treatment: 19 Clinic Level of Care Assessment Items TOOL 4 Quantity Score '[]'  - 0 Use when only an  EandM is performed on FOLLOW-UP visit ASSESSMENTS - Nursing Assessment / Reassessment X- 1 10 Reassessment of Co-morbidities (includes updates in patient status) X- 1 5 Reassessment of Adherence to Treatment Plan ASSESSMENTS - Wound and Skin A ssessment / Reassessment '[]'  - 0 Simple Wound Assessment / Reassessment - one wound X- 2 5 Complex Wound Assessment / Reassessment - multiple wounds '[]'  - 0 Dermatologic / Skin Assessment (not related to wound area) ASSESSMENTS - Focused Assessment '[]'  - 0 Circumferential Edema Measurements - multi extremities '[]'  - 0 Nutritional Assessment / Counseling / Intervention X- 1 5 Lower Extremity Assessment (monofilament, tuning fork, pulses) '[]'  - 0 Peripheral Arterial Disease Assessment (using hand held doppler) ASSESSMENTS - Ostomy and/or Continence Assessment and Care '[]'  - 0 Incontinence Assessment and Management '[]'  - 0 Ostomy Care Assessment and Management (repouching, etc.) PROCESS - Coordination of Care X - Simple Patient / Family Education for ongoing care 1 15 '[]'  - 0 Complex (extensive) Patient / Family Education for ongoing care X- 1 10 Staff obtains Programmer, systems, Records, T Results / Process Orders est '[]'  - 0 Staff telephones HHA, Nursing Homes / Clarify orders / etc '[]'  - 0 Routine Transfer to another Facility (non-emergent condition) '[]'  - 0 Routine Hospital Admission (non-emergent condition) '[]'  - 0 New Admissions / Biomedical engineer / Ordering NPWT Apligraf, etc. , '[]'  - 0 Emergency Hospital Admission (emergent condition) X- 1 10 Simple Discharge Coordination '[]'  - 0 Complex (extensive) Discharge Coordination PROCESS - Special Needs '[]'  - 0 Pediatric / Minor Patient Management '[]'  - 0 Isolation Patient Management '[]'  - 0 Hearing / Language / Visual special needs '[]'  - 0 Assessment of Community assistance (transportation, D/C planning, etc.) '[]'  - 0 Additional assistance / Altered mentation '[]'  - 0 Support Surface(s)  Assessment (bed, cushion, seat, etc.) INTERVENTIONS - Wound Cleansing / Measurement '[]'  - 0 Simple Wound Cleansing - one wound X- 2 5 Complex Wound Cleansing - multiple wounds X-  1 5 Wound Imaging (photographs - any number of wounds) '[]'  - 0 Wound Tracing (instead of photographs) '[]'  - 0 Simple Wound Measurement - one wound X- 2 5 Complex Wound Measurement - multiple wounds INTERVENTIONS - Wound Dressings X - Small Wound Dressing one or multiple wounds 2 10 '[]'  - 0 Medium Wound Dressing one or multiple wounds '[]'  - 0 Large Wound Dressing one or multiple wounds X- 1 5 Application of Medications - topical '[]'  - 0 Application of Medications - injection INTERVENTIONS - Miscellaneous '[]'  - 0 External ear exam '[]'  - 0 Specimen Collection (cultures, biopsies, blood, body fluids, etc.) '[]'  - 0 Specimen(s) / Culture(s) sent or taken to Lab for analysis '[]'  - 0 Patient Transfer (multiple staff / Civil Service fast streamer / Similar devices) '[]'  - 0 Simple Staple / Suture removal (25 or less) '[]'  - 0 Complex Staple / Suture removal (26 or more) '[]'  - 0 Hypo / Hyperglycemic Management (close monitor of Blood Glucose) '[]'  - 0 Ankle / Brachial Index (ABI) - do not check if billed separately X- 1 5 Vital Signs Has the patient been seen at the hospital within the last three years: Yes Total Score: 120 Level Of Care: New/Established - Level 4 Electronic Signature(s) Signed: 02/06/2021 5:05:02 PM By: Belinda Gouty RN, BSN Entered By: [name] on 02/06/2021 14:17:33 -------------------------------------------------------------------------------- Encounter Discharge Information Details Patient Name: Date of Service: [name] 02/06/2021 1:00 PM Medical Record Number: [id number] Patient Account Number: [id number] Date of Birth/Sex: Treating RN: [birth year] (45 y.o. [name] Primary Care [name]: [name] Other Clinician: Referring [name]: Treating [name]/Extender: [name], [name] in Treatment: 19 Encounter Discharge Information Items Discharge Condition: Stable Ambulatory Status: Wheelchair Discharge Destination: Home Transportation: Private Auto Accompanied By: daughter Schedule Follow-up Appointment: Yes Clinical Summary of Care: Electronic Signature(s) Signed: 02/06/2021 5:05:25 PM By: Deon Pilling Entered By: Deon Pilling on 02/06/2021 14:39:56 -------------------------------------------------------------------------------- Lower Extremity Assessment Details Patient Name: Date of Service: [name] [facility] 02/06/2021 1:00 PM Medical Record Number: [id number] Patient Account Number: [id number] Date of Birth/Sex: Treating RN: July 12, 1976 (45 y.o. [name] Primary Care Amberle Lyter: [name] Other Clinician: Referring Maxie Slovacek: Treating Dvontae Ruan/Extender: [name], [place] Weeks in Treatment: 19 Edema Assessment Assessed: [Left: No] [Right: Yes] Edema: [Left: Ye] [Right: s] Calf Left: Right: Point of Measurement: From Medial Instep 48 cm Ankle Left: Right: Point of Measurement: From Medial Instep 23 cm Vascular Assessment Pulses: Dorsalis Pedis Palpable: [Right:Yes] Electronic Signature(s) Signed: 02/09/2021 5:34:21 PM By: Belinda Hammock RN Entered By: [name] on 02/06/2021 13:40:44 -------------------------------------------------------------------------------- [place] Details Patient Name: Date of Service: [name] 02/06/2021 1:00 PM Medical Record Number: [id number] Patient Account Number: [id number] Date of Birth/Sex: Treating RN: 1976/07/24 (45 y.o. [name] Primary Care Pura Picinich: [name] Other Clinician: Referring Corian Handley: Treating Johanthan Kneeland/Extender: [name], [name] in Treatment: [place] reviewed with physician Active Inactive Abuse / Safety / Falls / Self Care  Management Nursing Diagnoses: History of Falls Potential for injury related to falls Goals: Patient will not experience any injury related to falls Date Initiated: 09/22/2020 Target Resolution Date: 03/06/2021 Goal Status: Active Patient/caregiver will verbalize/demonstrate measures taken to prevent injury and/or falls Date Initiated: 09/22/2020 Target Resolution Date: 03/06/2021 Goal Status: Active Interventions: Assess Activities of Daily Living upon admission and as needed Assess fall risk on admission and as needed Assess: immobility, friction, shearing, incontinence upon admission and as needed Assess impairment of  mobility on admission and as needed per policy Assess personal safety and home safety (as indicated) on admission and as needed Assess self care needs on admission and as needed Provide education on fall prevention Notes: Nutrition Nursing Diagnoses: Impaired glucose control: actual or potential Potential for alteratiion in Nutrition/Potential for imbalanced nutrition Goals: Patient/caregiver agrees to and verbalizes understanding of need to use nutritional supplements and/or vitamins as prescribed Date Initiated: 09/22/2020 Date Inactivated: 11/06/2020 Target Resolution Date: 11/06/2020 Goal Status: Met Patient/caregiver will maintain therapeutic glucose control Date Initiated: 09/22/2020 Target Resolution Date: 03/06/2021 Goal Status: Active Interventions: Assess HgA1c results as ordered upon admission and as needed Assess patient nutrition upon admission and as needed per policy Provide education on elevated blood sugars and impact on wound healing Provide education on nutrition Treatment Activities: Education provided on Nutrition : 10/14/2020 Notes: Electronic Signature(s) Signed: 02/06/2021 5:05:02 PM By: Belinda Gouty RN, BSN Entered By: [name] on 02/06/2021  14:07:44 -------------------------------------------------------------------------------- Pain Assessment Details Patient Name: Date of Service: [name] 02/06/2021 1:00 PM Medical Record Number: [id number] Patient Account Number: [id number] Date of Birth/Sex: Treating RN: [birth year] (46 y.o. [name] Primary Care Torsten Weniger: [name] Other Clinician: Referring Roemello Speyer: Treating Jaiquan Temme/Extender: [name], [place] Weeks in Treatment: 19 Active Problems Location of Pain Severity and Description of Pain Patient Has Paino Yes Site Locations Pain Location: Pain in Ulcers With Dressing Change: Yes Duration of the Pain. Constant / Intermittento Intermittent Rate the pain. Current Pain Level: 7 Worst Pain Level: 10 Least Pain Level: 0 Tolerable Pain Level: 7 Character of Pain Describe the Pain: Aching Pain Management and Medication Current Pain Management: Medication: Yes Cold Application: No Rest: Yes Massage: No Activity: No T.E.N.S.: No Heat Application: No Leg drop or elevation: No Is the Current Pain Management Adequate: Adequate How does your wound impact your activities of daily livingo Sleep: No Bathing: No Appetite: No Relationship With Others: No Bladder Continence: No Emotions: No Bowel Continence: No Work: No Toileting: No Drive: No Dressing: No Hobbies: No Electronic Signature(s) Signed: 02/09/2021 5:34:21 PM By: Belinda Hammock RN Entered By: [name] on 02/06/2021 13:20:19 -------------------------------------------------------------------------------- Patient/Caregiver Education Details Patient Name: Date of Service: [name] [facility] 3/4/2022andnbsp1:00 PM Medical Record Number: [id number] Patient Account Number: [id number] Date of Birth/Gender: Treating RN: 1976/06/30 (45 y.o. [name] Primary Care Physician: [name] Other Clinician: Referring Physician: Treating  Physician/Extender: [name], [name] in Treatment: 19 Education Assessment Education Provided To: Patient Education Topics Provided Elevated Blood Sugar/ Impact on Healing: Methods: Explain/Verbal Responses: Reinforcements needed, State content correctly Infection: Methods: Explain/Verbal Responses: Reinforcements needed, State content correctly Wound/Skin Impairment: Methods: Explain/Verbal Responses: Reinforcements needed, State content correctly Electronic Signature(s) Signed: 02/06/2021 5:05:02 PM By: Belinda Gouty RN, BSN Entered By: [name] on 02/06/2021 14:08:29 -------------------------------------------------------------------------------- Wound Assessment Details Patient Name: Date of Service: [name] 02/06/2021 1:00 PM Medical Record Number: [id number] Patient Account Number: [id number] Date of Birth/Sex: Treating RN: 03-01-1976 (45 y.o. [name] Primary Care Hyden Soley: [name] Other Clinician: Referring Amaia Lavallie: Treating Cassidee Deats/Extender: [name], [place] Weeks in Treatment: 19 Wound Status Wound Number: 4 Primary Diabetic Wound/Ulcer of the Lower Extremity Etiology: Wound Location: Right Amputation Site - Transmetatarsal Wound Open Wounding Event: Gradually Appeared Status: Date Acquired: 03/06/2020 Comorbid Asthma, Chronic Obstructive Pulmonary Disease (COPD), Sleep Weeks Of Treatment: 19 History: Apnea, Congestive Heart Failure, Hypertension, Peripheral Venous Clustered Wound: No Disease, Type II Diabetes, Osteoarthritis, Osteomyelitis, Neuropathy Photos Wound Measurements  Length: (cm) 0.7 Width: (cm) 1 Depth: (cm) 0.2 Area: (cm) 0.55 Volume: (cm) 0.11 % Reduction in Area: 36.3% % Reduction in Volume: 57.5% Epithelialization: Large (67-100%) Tunneling: No Undermining: No Wound Description Classification: Grade 2 Wound Margin: Distinct, outline attached Exudate  Amount: Medium Exudate Type: Serosanguineous Exudate Color: red, brown Foul Odor After Cleansing: No Slough/Fibrino Yes Wound Bed Granulation Amount: Large (67-100%) Exposed Structure Granulation Quality: Red Fascia Exposed: No Necrotic Amount: Small (1-33%) Fat Layer (Subcutaneous Tissue) Exposed: Yes Necrotic Quality: Adherent Slough Tendon Exposed: No Muscle Exposed: No Joint Exposed: No Bone Exposed: No Assessment Notes Periwound erythema Treatment Notes Wound #4 (Amputation Site - Transmetatarsal) Wound Laterality: Right Cleanser Soap and Water Discharge Instruction: May shower and wash wound with dial antibacterial soap and water prior to dressing change. Normal Saline Discharge Instruction: Cleanse the wound with Normal Saline prior to applying a clean dressing using gauze sponges, not tissue or cotton balls. Peri-Wound Care Topical Primary Dressing Hydrofera Blue Ready Foam, 4x5 in Discharge Instruction: Apply to wound bed as instructed. moisten with saline. Secondary Dressing Woven Gauze Sponge, Non-Sterile 4x4 in Discharge Instruction: Apply over primary dressing as directed. Secured With [employer], 4.5x3.1 (in/yd) Discharge Instruction: Secure with Kerlix as directed. Paper Tape, 2x10 (in/yd) Discharge Instruction: Secure dressing with tape as directed. Compression Wrap Compression Stockings Add-Ons Electronic Signature(s) Signed: 02/10/2021 11:11:03 AM By: Sandre Kitty Signed: 02/11/2021 5:36:09 PM By: Belinda Hammock RN Previous Signature: 02/09/2021 5:34:21 PM Version By: Belinda Hammock RN Entered By: Sandre Kitty on 02/10/2021 10:30:14 -------------------------------------------------------------------------------- Wound Assessment Details Patient Name: Date of Service: [name] 02/06/2021 1:00 PM Medical Record Number: [id number] Patient Account Number: [id number] Date of Birth/Sex: Treating RN: June 30, 1976 (45 y.o. [name],  [name] Primary Care Julian Medina: [name] Other Clinician: Referring Sophiah Rolin: Treating Dravyn Severs/Extender: [name], [place] Weeks in Treatment: 19 Wound Status Wound Number: 5 Primary Diabetic Wound/Ulcer of the Lower Extremity Etiology: Wound Location: Right, Posterior Lower Leg Wound Open Wounding Event: Not Known Status: Date Acquired: 11/05/2020 Comorbid Asthma, Chronic Obstructive Pulmonary Disease (COPD), Sleep Weeks Of Treatment: 7 History: Apnea, Congestive Heart Failure, Hypertension, Peripheral Venous Clustered Wound: No Disease, Type II Diabetes, Osteoarthritis, Osteomyelitis, Neuropathy Photos Wound Measurements Length: (cm) 1.5 Width: (cm) 2 Depth: (cm) 0.3 Area: (cm) 2.356 Volume: (cm) 0.707 % Reduction in Area: -4.2% % Reduction in Volume: -212.8% Epithelialization: Small (1-33%) Tunneling: No Undermining: No Wound Description Classification: Grade 2 Wound Margin: Flat and Intact Exudate Amount: Medium Exudate Type: Purulent Exudate Color: yellow, brown, green Foul Odor After Cleansing: No Slough/Fibrino Yes Wound Bed Granulation Amount: Small (1-33%) Exposed Structure Granulation Quality: Pink, Pale Fascia Exposed: No Necrotic Amount: Large (67-100%) Fat Layer (Subcutaneous Tissue) Exposed: Yes Necrotic Quality: Adherent Slough Tendon Exposed: No Muscle Exposed: No Joint Exposed: No Bone Exposed: No Assessment Notes Periwound erythema Treatment Notes Wound #5 (Lower Leg) Wound Laterality: Right, Posterior Cleanser Soap and Water Discharge Instruction: May shower and wash wound with dial antibacterial soap and water prior to dressing change. Wound Cleanser Discharge Instruction: Cleanse the wound with wound cleanser prior to applying a clean dressing using gauze sponges, not tissue or cotton balls. Peri-Wound Care Topical Primary Dressing Hydrofera Blue Classic Foam, 4x4 in Discharge Instruction: Moisten with  saline prior to applying to wound bed Secondary Dressing Woven Gauze Sponge, Non-Sterile 4x4 in Discharge Instruction: Apply over primary dressing as directed. Secured With 109M Medipore H Soft Cloth Surgical T 4 x 2 (in/yd) ape Discharge Instruction: Secure dressing with tape  as directed. Compression Wrap Compression Stockings Add-Ons Electronic Signature(s) Signed: 02/10/2021 11:11:03 AM By: Sandre Kitty Signed: 02/11/2021 5:36:09 PM By: Belinda Hammock RN Previous Signature: 02/09/2021 5:34:21 PM Version By: Belinda Hammock RN Entered By: Sandre Kitty on 02/10/2021 10:29:49 -------------------------------------------------------------------------------- Vitals Details Patient Name: Date of Service: [name] 02/06/2021 1:00 PM Medical Record Number: [id number] Patient Account Number: [id number] Date of Birth/Sex: Treating RN: 08-03-1976 (45 y.o. [name] Primary Care Shilah Hefel: [name] Other Clinician: Referring Rose Hippler: Treating Kennieth Plotts/Extender: [name], [place] Weeks in Treatment: 19 Vital Signs Time Taken: 13:17 Temperature (F): 98.1 Height (in): 62 Pulse (bpm): 94 Weight (lbs): 362 Respiratory Rate (breaths/min): 17 Body Mass Index (BMI): 66.2 Blood Pressure (mmHg): 74/45 Reference Range: 80 - 120 mg / dl Notes manual BP 118/90 Electronic Signature(s) Signed: 02/06/2021 5:05:02 PM By: Belinda Gouty RN, BSN Entered By: [name] on 02/06/2021 14:09:18

## 2021-02-10 ENCOUNTER — Ambulatory Visit (INDEPENDENT_AMBULATORY_CARE_PROVIDER_SITE_OTHER): Payer: Medicaid Other | Admitting: Family Medicine

## 2021-02-10 ENCOUNTER — Encounter: Payer: Self-pay | Admitting: Family Medicine

## 2021-02-10 ENCOUNTER — Other Ambulatory Visit: Payer: Self-pay

## 2021-02-10 VITALS — BP 123/80 | HR 95

## 2021-02-10 DIAGNOSIS — E785 Hyperlipidemia, unspecified: Secondary | ICD-10-CM

## 2021-02-10 DIAGNOSIS — E1151 Type 2 diabetes mellitus with diabetic peripheral angiopathy without gangrene: Secondary | ICD-10-CM

## 2021-02-10 DIAGNOSIS — E662 Morbid (severe) obesity with alveolar hypoventilation: Secondary | ICD-10-CM | POA: Diagnosis not present

## 2021-02-10 DIAGNOSIS — E1165 Type 2 diabetes mellitus with hyperglycemia: Secondary | ICD-10-CM | POA: Diagnosis not present

## 2021-02-10 DIAGNOSIS — IMO0002 Reserved for concepts with insufficient information to code with codable children: Secondary | ICD-10-CM

## 2021-02-10 MED ORDER — INSULIN ASPART 100 UNIT/ML FLEXPEN: 40.0000 [IU] | PEN_INJECTOR | Freq: Three times a day (TID) | SUBCUTANEOUS | 3 refills | Status: DC

## 2021-02-10 MED ORDER — LANTUS SOLOSTAR 100 UNIT/ML ~~LOC~~ SOPN: 100.0000 [IU] | PEN_INJECTOR | Freq: Two times a day (BID) | SUBCUTANEOUS | 3 refills | Status: DC

## 2021-02-10 MED ORDER — ONDANSETRON 4 MG PO TBDP: 4.0000 mg | ORAL_TABLET | Freq: Three times a day (TID) | ORAL | 0 refills | Status: DC | PRN

## 2021-02-10 NOTE — Progress Notes (Signed)
  Date of Visit: 02/10/2021   SUBJECTIVE:   HPI:  [name] presents today for routine follow up. She has multiple items to discuss today.  Diabetes - saw new endocrinologist but now is requesting to see someone else. Had been put on concentrated insulin which was causing her to feel nauseated. When she expressed this to her physician and asked for changes to be made, she says he told her there was nothing more he could do for her so she does not want to return to that office. Would be willing to back to [place]. Needs new rx for her old insulin regimen consisting of lantus and regular novolog. Has been doing lantus 90u twice daily and novolog 40u three times daily. Fasting sugars are 300s and up. Nothing lower than 300. Has dexcom CGM.  Hyperlipidemia - taking atorv 40mg  daily. Agreeable to checking lipids today.  physical therapy referral - has not yet heard about physical therapy appointment.  Chest pain - still waking up in middle of night with chest pain and trouble breathing. Hard to lay flat. 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 Last echo in July of last year without any strong evidence of CHF. Has not gotten back in with pulmonology. Has been doing 1.5L of O2 at night because feels like when she does higher number of L she gets chest pain. Last visit we increased torsemide to 80mg  daily. She does not think this helped with her symptoms at all. Denies feeling swollen.  Reports R midfoot is infected and she is being treated by wound cneter with an oral antibiotic, does not want me to look at it today  OBJECTIVE:   BP 123/80   Pulse 95   LMP 02/10/2021 (Exact Date)   SpO2 95%  Gen: no acute distress, pleasant, cooperative HEENT: normocephalic, atraumatic  Heart: regular rate and rhythm, no mrumur Lungs: clear to auscultation bilaterally, normal work of breathing  Neuro: alert, grossly nonfocal, speech normal Ext: s/p L BKA. RLE wrapped extensively, did not unwrap  by patient request  ASSESSMENT/PLAN:    HLD (hyperlipidemia) Update lipids today  DM (diabetes mellitus) type II uncontrolled, periph vascular disorder ([name]) Sugars extremely uncontrolled. Now needing a new endocrinologist again. Will refer to [place], patient agrees to see any physician but Dr. Loanne Drilling.  Will message pharmacy team for assistance with titrating insulin. For now go up to lantus 100u twice daily, continue novolog 40u three times daily with meals. Has made some really good dietary changes and desires to meet with nutritionist. Will refer to Dr. Jenne Campus.  Obesity hypoventilation syndrome ([name]) Etiology of chest pain unclear; recent negative cardiac workup. Will repeat echo since presumably she could have since developed CHF since time of last echo. Otherwise not sure what else to add; asked her to schedule follow up with pulmonology. Suspect this is related to body habitus and OHS.    Will message referral coordinator about status of physical therapy referral.  Refer to CCM for assistance with care coordination, patient would also like therapist. Patient agreeable to referral.   [name]. [name], [place]

## 2021-02-10 NOTE — Patient Instructions (Signed)
Will refer to new endocrinologist and check on physical therapy referral  Checking labs today  For weight management: Call Dr. Jenne Campus (our nutritionist) to set up an appointment. Her phone number is: [phone].  Sent in more insulin. Increase lantus to 100u twice daily  Will ask pharmacy team [name] to call you  Ordering echo. Schedule appointment with pulmonology  Sent in zofran for you  Follow up in 3 months ,sooner if needed  Be well, Dr. [name]

## 2021-02-11 ENCOUNTER — Telehealth: Payer: Self-pay | Admitting: Family Medicine

## 2021-02-11 LAB — BASIC METABOLIC PANEL
BUN/Creatinine Ratio: 41 — ABNORMAL HIGH (ref 9–23)
BUN: 41 mg/dL — ABNORMAL HIGH (ref 6–24)
CO2: 26 mmol/L (ref 20–29)
Calcium: 9.1 mg/dL (ref 8.7–10.2)
Chloride: 91 mmol/L — ABNORMAL LOW (ref 96–106)
Creatinine, Ser: 0.99 mg/dL (ref 0.57–1.00)
Glucose: 457 mg/dL — ABNORMAL HIGH (ref 65–99)
Potassium: 4.5 mmol/L (ref 3.5–5.2)
Sodium: 135 mmol/L (ref 134–144)
eGFR: 73 mL/min/{1.73_m2} (ref >59)

## 2021-02-11 LAB — LIPID PANEL
Chol/HDL Ratio: 8.9 ratio — ABNORMAL HIGH (ref 0.0–4.4)
Cholesterol, Total: 330 mg/dL — ABNORMAL HIGH (ref 100–199)
HDL: 37 mg/dL — ABNORMAL LOW (ref >39)
LDL Chol Calc (NIH): 192 mg/dL — ABNORMAL HIGH (ref 0–99)
Triglycerides: 486 mg/dL — ABNORMAL HIGH (ref 0–149)
VLDL Cholesterol Cal: 101 mg/dL — ABNORMAL HIGH (ref 5–40)

## 2021-02-11 MED ORDER — ATORVASTATIN CALCIUM 80 MG PO TABS
80.0000 mg | ORAL_TABLET | Freq: Every day | ORAL | 3 refills | Status: DC
Start: 2021-02-11 — End: 2022-02-05

## 2021-02-11 NOTE — Assessment & Plan Note (Addendum)
Sugars extremely uncontrolled. Now needing a new endocrinologist again. Will refer to [place], patient agrees to see any physician but Dr. Loanne Drilling.  Will message pharmacy team for assistance with titrating insulin. For now go up to lantus 100u twice daily, continue novolog 40u three times daily with meals. Has made some really good dietary changes and desires to meet with nutritionist. Will refer to Dr. Jenne Campus.

## 2021-02-11 NOTE — Telephone Encounter (Signed)
Called patient, advised renal function normal but lipids markedly elevated. Increase lipitor to 80mg  daily Recheck direct LDL in 3 months Patient appreciative  [name], MD

## 2021-02-11 NOTE — Assessment & Plan Note (Signed)
Update lipids today

## 2021-02-11 NOTE — Assessment & Plan Note (Signed)
>>  ASSESSMENT AND PLAN FOR INSULIN DEPENDENT TYPE 2 DIABETES MELLITUS ([place]) WRITTEN ON 02/11/2021 11:53 PM BY Charle Mclaurin, [name], MD  Sugars extremely uncontrolled. Now needing a new endocrinologist again. Will refer to [place], patient agrees to see any physician but Dr. Loanne Drilling.  Will message pharmacy team for assistance with titrating insulin. For now go up to lantus 100u twice daily, continue novolog 40u three times daily with meals. Has made some really good dietary changes and desires to meet with nutritionist. Will refer to Dr. Jenne Campus.

## 2021-02-11 NOTE — Assessment & Plan Note (Signed)
Etiology of chest pain unclear; recent negative cardiac workup. Will repeat echo since presumably she could have since developed CHF since time of last echo. Otherwise not sure what else to add; asked her to schedule follow up with pulmonology. Suspect this is related to body habitus and OHS.

## 2021-02-13 DIAGNOSIS — Z9884 Bariatric surgery status: Secondary | ICD-10-CM | POA: Diagnosis not present

## 2021-02-13 DIAGNOSIS — E119 Type 2 diabetes mellitus without complications: Secondary | ICD-10-CM | POA: Diagnosis not present

## 2021-02-13 DIAGNOSIS — I10 Essential (primary) hypertension: Secondary | ICD-10-CM | POA: Diagnosis not present

## 2021-02-13 DIAGNOSIS — Z6841 Body Mass Index (BMI) 40.0 and over, adult: Secondary | ICD-10-CM | POA: Diagnosis not present

## 2021-02-13 DIAGNOSIS — G4733 Obstructive sleep apnea (adult) (pediatric): Secondary | ICD-10-CM | POA: Diagnosis not present

## 2021-02-13 DIAGNOSIS — Z0389 Encounter for observation for other suspected diseases and conditions ruled out: Secondary | ICD-10-CM | POA: Diagnosis not present

## 2021-02-16 ENCOUNTER — Telehealth: Payer: Self-pay

## 2021-02-16 ENCOUNTER — Telehealth: Payer: Self-pay | Admitting: Internal Medicine

## 2021-02-16 ENCOUNTER — Telehealth: Payer: Self-pay | Admitting: *Deleted

## 2021-02-16 DIAGNOSIS — R0789 Other chest pain: Secondary | ICD-10-CM | POA: Diagnosis not present

## 2021-02-16 DIAGNOSIS — R079 Chest pain, unspecified: Secondary | ICD-10-CM | POA: Diagnosis not present

## 2021-02-16 DIAGNOSIS — G4733 Obstructive sleep apnea (adult) (pediatric): Secondary | ICD-10-CM

## 2021-02-16 NOTE — Telephone Encounter (Signed)
Not notified by clinical staff of this echo.  PA pending with [facility].  Reference# [id number].  Yasaman Kolek,CMA

## 2021-02-16 NOTE — Telephone Encounter (Signed)
Called and spoke with pt and she stated that she does not have an outlet near her bed that she can plug the bipap into so that is why it is plugged into the 6 way.   She is aware that the order has been placed for her to get the SD card and she stated that she will go and pick this up on the 21st when she goes to get her covid test.  Nothing further is needed.

## 2021-02-16 NOTE — Telephone Encounter (Signed)
[name], I can't advise anything without a download. Is there still a card inside her machine that they could pull it from that? If she doesn't have one, we need her to get an SD card so that we can read it at her next appointment.

## 2021-02-16 NOTE — Telephone Encounter (Signed)
Spoke with the pt  She is c/o pressure in her chest every morning for the past 2 wks  She relates the pressure to her BIPAP machine bc it only occurs in the am and lasts for approx 30 min after removing BIPAP  She tried reducing the amount of o2 bled in- was using 2 lpm and she reduced it to 1.5 lpm and this helped minimally  Pt denies any SOB, wheezing, cough or other new co's  I checked airview and no recent DL info, called [employer]- was advised that it looks like her BIPAP is not in an area that is picking up cell service so DL unobtainable  Dr Shearon Stalls, can you please advise any recs? Note that the pt does have PFT set for 3/24 and ov with you on 3/30

## 2021-02-16 NOTE — Chronic Care Management (AMB) (Signed)
  Care Management   Note  02/16/2021 Name: Yumiko Alkins MRN: [id number] DOB: [birth year]  Deshannon Seide is a 45 y.o. year old female who is a primary care patient of [name], MD. I reached out to [name] by phone today in response to a referral sent by Ms. Valary [name]'s PCP, [name], MD.    [name] was given information about care management services today including:  1. Care management services include personalized support from designated clinical staff supervised by her physician, including individualized plan of care and coordination with other care providers 2. 24/7 contact phone numbers for assistance for urgent and routine care needs. 3. The patient may stop care management services at any time by phone call to the office staff.  Patient agreed to services and verbal consent obtained.   Follow up plan: Telephone appointment with care management team member scheduled for:02/19/2021  [facility]

## 2021-02-16 NOTE — Telephone Encounter (Signed)
Echo Lab calls nurse line reporting patient has an apt tomorrow, however the echo needs pre-certification. Will forward to [place].

## 2021-02-17 ENCOUNTER — Other Ambulatory Visit: Payer: Self-pay

## 2021-02-17 ENCOUNTER — Ambulatory Visit (HOSPITAL_COMMUNITY)
Admission: RE | Admit: 2021-02-17 | Discharge: 2021-02-17 | Disposition: A | Discharge status: Home / Self Care | Payer: Medicaid Other | Source: Ambulatory Visit | Attending: Family Medicine | Admitting: Family Medicine

## 2021-02-17 DIAGNOSIS — J449 Chronic obstructive pulmonary disease, unspecified: Secondary | ICD-10-CM | POA: Diagnosis not present

## 2021-02-17 DIAGNOSIS — E785 Hyperlipidemia, unspecified: Secondary | ICD-10-CM | POA: Diagnosis not present

## 2021-02-17 DIAGNOSIS — I517 Cardiomegaly: Secondary | ICD-10-CM | POA: Insufficient documentation

## 2021-02-17 DIAGNOSIS — R0600 Dyspnea, unspecified: Secondary | ICD-10-CM | POA: Insufficient documentation

## 2021-02-17 DIAGNOSIS — I471 Supraventricular tachycardia: Secondary | ICD-10-CM | POA: Insufficient documentation

## 2021-02-17 DIAGNOSIS — E119 Type 2 diabetes mellitus without complications: Secondary | ICD-10-CM | POA: Insufficient documentation

## 2021-02-17 DIAGNOSIS — E662 Morbid (severe) obesity with alveolar hypoventilation: Secondary | ICD-10-CM | POA: Diagnosis not present

## 2021-02-17 LAB — ECHOCARDIOGRAM COMPLETE
Area-P 1/2: 5.46 cm2
S' Lateral: 2.9 cm

## 2021-02-17 NOTE — Telephone Encounter (Signed)
PA still pending as of this morning.  Brock Larmon,CMA

## 2021-02-17 NOTE — Progress Notes (Signed)
  Echocardiogram 2D Echocardiogram has been performed.  Darlina Sicilian M 02/17/2021, 3:23 PM

## 2021-02-18 ENCOUNTER — Telehealth: Payer: Self-pay | Admitting: *Deleted

## 2021-02-18 NOTE — Telephone Encounter (Signed)
-----   Message from [name], MD sent at 02/12/2021  1:26 PM EST ----- Can you call her & give this info?  Thanks [name], MD   ----- Message ----- From: Valerie Roys, CMA Sent: 02/12/2021  10:37 AM EST To: [name], MD  Neuro rehab can take a while to schedule patients.  She is welcome to call them and see if they can go ahead and schedule it.    Phone: [phone]   ----- Message ----- From: [name], MD Sent: 02/11/2021  11:58 PM EST To: Valerie Roys, CMA  Can you check on status of physical therapy referral? Thanks [name], MD

## 2021-02-18 NOTE — Telephone Encounter (Signed)
Pa still pending.  Echo was performed on scheduled date.  Shashana Fullington,CMA

## 2021-02-18 NOTE — Telephone Encounter (Signed)
Pt informed of info. Yanely Mast Kennon Holter, CMA

## 2021-02-19 ENCOUNTER — Ambulatory Visit: Payer: Medicaid Other | Admitting: Licensed Clinical Social Worker

## 2021-02-19 ENCOUNTER — Encounter: Payer: Self-pay | Admitting: Family Medicine

## 2021-02-19 DIAGNOSIS — Z7189 Other specified counseling: Secondary | ICD-10-CM

## 2021-02-19 DIAGNOSIS — Z139 Encounter for screening, unspecified: Secondary | ICD-10-CM

## 2021-02-19 NOTE — Patient Instructions (Signed)
  [name]  it was nice speaking with you. Please call me directly [phone] if you have questions about the goals we discussed. Goals Addressed            This Visit's Progress   . Begin and Stick with Counseling-Depression       Timeframe:  Long-Range Goal Priority:  High Start Date:  02/19/21                           Expected End Date:  04/04/21                     Follow Up Date 03/12/21   Patient Goals/Self-Care Activities: Over the next 30 days . Complete papers for [facility] Counseling . Your appointment is April 19th at 12:00   Why is this important?    Beating depression may take some time.   If you don't feel better right away, don't give up on your treatment plan.        . Find Help in My Community       Timeframe:  Long-Range Goal Priority:  High Start Date:   02/19/21                          Expected End Date:  on going                    Patient Goals/Self-Care Activities: Over the next 30 days . Do food pick up at [place] Sat. 9 to 12   Why is this important?    Knowing how and where to find help for yourself or family in your neighborhood and community is an important skill.      [name] received Care Coordination services today:  1. Care Coordination services include personalized support from designated clinical staff supervised by her physician, including individualized plan of care and coordination with other care providers 2. 24/7 contact [phone] for assistance for urgent and routine care needs. 3. Care Coordination are voluntary services and be declined at any time by calling the office.  Patient verbalizes understanding of instructions provided today.    Follow up plan: Appointment scheduled for SW follow up with client by phone on: 03/12/21  Maurine Cane, LCSW

## 2021-02-19 NOTE — Chronic Care Management (AMB) (Signed)
Care Management Clinical Social Work Note  02/19/2021 Name: [name] MRN: [id number] DOB: [birth year]  [name] is a 45 y.o. year old female who is a primary care patient of [name], MD.  The Care Management team was consulted for assistance with chronic disease management and coordination needs.  Engaged with patient by telephone for initial visit in response to provider referral for social work chronic care management and care coordination services  Consent to Services:  [name] was given information about Care Management services today including:  1. Care Management services includes personalized support from designated clinical staff supervised by her physician, including individualized plan of care and coordination with other care providers 2. 24/7 contact phone numbers for assistance for urgent and routine care needs. 3. The patient may stop case management services at any time by phone call to the office staff.  Patient agreed to services and consent obtained.   Assessment: Patient is currently experiencing difficulty with managing symptoms of depression.reports dealing with depression and on off for many years. Has a PCS aide that assist her daily, and uses [employer] and [employer] Transportation.. See Care Plan below for interventions and patient self-care actives.  Recommendation: Patient may benefit from, and is in agreement to starting virtual counseling. Appointment scheduled with [place].   Follow up Plan: Patient would like continued follow-up.  CCM LCSW will f/u with patient in 3 weeks.. Patient will call office if needed prior to next encounter    Review of patient past medical history, allergies, medications, and health status, including review of relevant consultants reports was performed today as part of a comprehensive evaluation and provision of chronic care management and care coordination services.  SDOH (Social Determinants of Health)  assessments and interventions performed:  SDOH Interventions   Flowsheet Row Most Recent Value  SDOH Interventions   Food Insecurity Interventions Other (Comment)  Financial Strain Interventions Other (Comment)  Stress Interventions Provide Counseling       Advanced Directives Status: Not addressed in this encounter.  Care Plan  Allergies  Allergen Reactions  . Cefepime Other (See Comments)    AKI, see records from [place] hospitalization in January 2020.    Marland Kitchen Kiwi Extract Shortness Of Breath and Swelling  . Morphine And Related Nausea And Vomiting  . Trental [Pentoxifylline] Nausea And Vomiting  . Nubain [Nalbuphine Hcl] Other (See Comments)    "FEELS LIKE SOMETHING CRAWLING ON ME"    Outpatient Encounter Medications as of 02/19/2021  Medication Sig  . Accu-Chek FastClix Lancets MISC USE DAILY AS INSTRUCTED  . ACCU-CHEK GUIDE test strip USE T0 CHECK BLOOD SUGAR 3 TIMES DAILY  . Accu-Chek Softclix Lancets lancets Use to check blood glucose four times daily  . aspirin EC 81 MG tablet Take 1 tablet (81 mg total) by mouth daily. Swallow whole.  Marland Kitchen atorvastatin (LIPITOR) 80 MG tablet Take 1 tablet (80 mg total) by mouth daily.  . budesonide-formoterol (SYMBICORT) 160-4.5 MCG/ACT inhaler Inhale 2 puffs into the lungs 2 (two) times daily.  . Cholecalciferol 1000 units tablet Take 1 tablet (1,000 Units total) by mouth daily.  . Compression Bandages MISC Wear daily for swelling  . Continuous Blood Gluc Sensor (DEXCOM G6 SENSOR) MISC Inject 1 applicator into the skin as directed. Change sensor every 10 days.  . Continuous Blood Gluc Transmit (DEXCOM G6 TRANSMITTER) MISC Inject 1 Device into the skin as directed. Reuse 8 times with sensor changes.  . gabapentin (NEURONTIN) 600 MG tablet TAKE 2 TABLETS  BY MOUTH 3 TIMES DAILY  . HYDROcodone-acetaminophen (NORCO) 10-325 MG tablet Take 1 tablet by mouth every 8 (eight) hours as needed for moderate pain.  Marland Kitchen HYDROcodone-acetaminophen (NORCO) 10-325  MG tablet Take 1 tablet by mouth every 8 (eight) hours as needed for moderate pain.  Marland Kitchen HYDROcodone-acetaminophen (NORCO) 10-325 MG tablet Take 1 tablet by mouth every 8 (eight) hours as needed for moderate pain.  Marland Kitchen insulin aspart (NOVOLOG) 100 UNIT/ML FlexPen Inject 40 Units into the skin 3 (three) times daily with meals.  . insulin glargine (LANTUS SOLOSTAR) 100 UNIT/ML Solostar Pen Inject 100 Units into the skin 2 (two) times daily.  . Insulin Pen Needle (SURE COMFORT PEN NEEDLES) 32G X 4 MM MISC Use to inject insulin as indicated  . ipratropium-albuterol (DUONEB) 0.5-2.5 (3) MG/3ML SOLN Take 3 mLs by nebulization every 4 (four) hours as needed.  . liraglutide (VICTOZA) 18 MG/3ML SOPN Inject 1.2 mg into the skin daily.  Marland Kitchen loratadine (CLARITIN) 10 MG tablet Take 1 tablet (10 mg total) by mouth daily.  . metoprolol succinate (TOPROL-XL) 25 MG 24 hr tablet Take 25 mg by mouth daily.  . Misc. Devices (TRANSFER BENCH) MISC Use daily  . omeprazole (PRILOSEC) 40 MG capsule Take 1 capsule (40 mg total) by mouth daily.  . ondansetron (ZOFRAN ODT) 4 MG disintegrating tablet Take 1 tablet (4 mg total) by mouth every 8 (eight) hours as needed for nausea.  . potassium chloride SA (KLOR-CON M20) 20 MEQ tablet Take 1 tablet (20 mEq total) by mouth daily.  Marland Kitchen rOPINIRole (REQUIP) 1 MG tablet Take 1 tablet (1 mg total) by mouth at bedtime.  . Tiotropium Bromide Monohydrate (SPIRIVA RESPIMAT) 1.25 MCG/ACT AERS Inhale 2 puffs into the lungs daily.  Marland Kitchen torsemide 40 MG TABS Take 80 mg by mouth daily.   No facility-administered encounter medications on file as of 02/19/2021.    Patient Active Problem List   Diagnosis Date Noted  . Urinary incontinence 10/23/2020  . Elevated troponin   . Acute respiratory failure with hypoxia and hypercarbia ([place]) 06/15/2020  . Chronic respiratory failure with hypoxia ([place]) 06/14/2020  . Blister of foot 04/21/2020  . Exposure to mold 04/21/2020  . Constipation 04/03/2020  . Left  shoulder pain 06/23/2019  . Hyperglycemia due to diabetes mellitus ([place])   . 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[place]) 04/11/2019  . Allergic rhinitis 03/06/2018  . Class 3 severe obesity due to excess calories with serious comorbidity and body mass index (BMI) of 50.0 to 59.9 in adult [place])   . Decreased pedal pulses 06/10/2017  . Unilateral primary osteoarthritis, right knee 10/22/2016  . Primary osteoarthritis of first carpometacarpal joint of left hand 07/30/2016  . Diabetic neuropathy ([place]) 07/14/2016  . Nausea 03/17/2016  . De Quervain's tenosynovitis, bilateral 11/01/2015  . Vitamin D deficiency 09/05/2015  . Recurrent candidiasis of vagina 09/05/2015  . Varicose veins of leg with complications 93/90/3009  . Restless leg syndrome 10/17/2014  . Chronic sinusitis 07/18/2014  . Encounter for chronic pain management 06/30/2013  . HLD (hyperlipidemia) 11/19/2012  . Angina pectoris ([place]) 06/27/2012  . Abscess of skin and subcutaneous tissue 11/04/2011  . Right carpal tunnel syndrome 09/01/2011  . Bilateral knee pain 09/01/2011  . DM (diabetes mellitus) type II uncontrolled, periph vascular disorder ([place]) 05/22/2008  . Morbid obesity ([name]) 05/22/2008  . Obesity hypoventilation syndrome ([place]) 05/22/2008  . Mood disorder ([place]) 05/22/2008  . Obstructive sleep apnea 05/22/2008  . Hypertension 05/22/2008  . Asthma 05/22/2008  . GERD 05/22/2008  Conditions to be addressed/monitored: Depression; Financial constraints related to fixed income and Limited access to food  Problem: Symptoms of Depression Identification   Long-Range Goal: Manage symptoms of depression and start counseling   Start Date: 02/19/2021  This Visit's Progress: On track  Priority: High  Current barriers:   . Chronic Mental Health needs related to symptoms of depression  Currently unable to independently self manage needs related to mental health conditions.  Knowledge Deficits related to short term plan for care  coordination  needs and long term plans for chronic disease management . Lacks knowledge of where and how to connect for counseling  . Needs Support, Education, and Care Coordination in order to meet unmet need.  Clinical Goal(s): Over the next 30 days, patient will work with SW to reduce or manage symptoms of depression and stress until connected for ongoing counseling.  Clinical Interventions:  . Assessed patient's previous treatment, needs, coping skills, current treatment, support system and barriers to care . Provided basic mental health support, education and interventions  . Discussed several options for long term counseling based on need and insurance. Assisted patient with narrowing the options down to [facility] Counseling  ) . Reviewed mental health medications with patient prescribed by PCP and discussed compliance : patient reports not taking any mental health medications . Other interventions include: Solution-Focused Strategies . Emotional/Supportive Counseling  . Collaboration with PCP regarding development and update of comprehensive plan of care as evidenced by provider attestation and co-signature . Inter-disciplinary care team collaboration (see longitudinal plan of care) Patient Goals/Self-Care Activities: Over the next 30 days . Complete papers for [facility] Counseling . Your appointment is April 19th at 12:00 Follow Up Plan: 3 weeks    Problem: Food insecurities   Priority: High  Onset Date: 02/19/2021  Current barriers:   . Patient in need of assistance with connecting to community resources for food . Acknowledges deficits with meeting this unmet need . Patient is unable to independently navigate community resource options without care coordination support Clinical Goals: patient will work with food resources to address needs related to running out of food each month Clinical Interventions:  . Collaboration with [name], MD regarding development  and update of comprehensive plan of care as evidenced by provider attestation and co-signature . Inter-disciplinary care team collaboration (see longitudinal plan of care) . Assessment of needs, barriers , agencies contacted, as well as how impacting  . Review various resources, discussed options and provided patient information about [employer] [facility] [employer] Program and [employer] . Advised patient to she is able to do food pick up on Sat when her daughter can go with her. . Other interventions provided:Solution-Focused Strategies . Problem Solving Patient Goals/Self-Care Activities: Over the next 30 days . Do food pick up at [place] Sat. 9 to [place], [place] / [place]   [phone] 4:24 PM

## 2021-02-20 ENCOUNTER — Other Ambulatory Visit: Payer: Self-pay

## 2021-02-20 ENCOUNTER — Other Ambulatory Visit (HOSPITAL_COMMUNITY)
Admission: RE | Admit: 2021-02-20 | Discharge: 2021-02-20 | Disposition: A | Discharge status: Home / Self Care | Payer: Medicaid Other | Source: Other Acute Inpatient Hospital | Attending: Internal Medicine | Admitting: Internal Medicine

## 2021-02-20 ENCOUNTER — Encounter (HOSPITAL_BASED_OUTPATIENT_CLINIC_OR_DEPARTMENT_OTHER): Payer: Medicaid Other | Admitting: Internal Medicine

## 2021-02-20 DIAGNOSIS — Z89431 Acquired absence of right foot: Secondary | ICD-10-CM | POA: Diagnosis not present

## 2021-02-20 DIAGNOSIS — E1151 Type 2 diabetes mellitus with diabetic peripheral angiopathy without gangrene: Secondary | ICD-10-CM | POA: Diagnosis not present

## 2021-02-20 DIAGNOSIS — I5032 Chronic diastolic (congestive) heart failure: Secondary | ICD-10-CM | POA: Diagnosis not present

## 2021-02-20 DIAGNOSIS — E11622 Type 2 diabetes mellitus with other skin ulcer: Secondary | ICD-10-CM | POA: Diagnosis not present

## 2021-02-20 DIAGNOSIS — E11621 Type 2 diabetes mellitus with foot ulcer: Secondary | ICD-10-CM | POA: Diagnosis not present

## 2021-02-20 DIAGNOSIS — L97512 Non-pressure chronic ulcer of other part of right foot with fat layer exposed: Secondary | ICD-10-CM | POA: Diagnosis not present

## 2021-02-20 DIAGNOSIS — I110 Hypertensive heart disease with heart failure: Secondary | ICD-10-CM | POA: Diagnosis not present

## 2021-02-20 DIAGNOSIS — Z6841 Body Mass Index (BMI) 40.0 and over, adult: Secondary | ICD-10-CM | POA: Diagnosis not present

## 2021-02-20 DIAGNOSIS — L98492 Non-pressure chronic ulcer of skin of other sites with fat layer exposed: Secondary | ICD-10-CM | POA: Diagnosis not present

## 2021-02-20 DIAGNOSIS — J449 Chronic obstructive pulmonary disease, unspecified: Secondary | ICD-10-CM | POA: Diagnosis not present

## 2021-02-20 DIAGNOSIS — Z89512 Acquired absence of left leg below knee: Secondary | ICD-10-CM | POA: Diagnosis not present

## 2021-02-20 DIAGNOSIS — L02214 Cutaneous abscess of groin: Secondary | ICD-10-CM | POA: Insufficient documentation

## 2021-02-20 DIAGNOSIS — L97218 Non-pressure chronic ulcer of right calf with other specified severity: Secondary | ICD-10-CM | POA: Diagnosis not present

## 2021-02-20 NOTE — Progress Notes (Signed)
[name] ([id number]) Visit Report for 02/20/2021 Debridement Details Patient Name: Date of Service: [name] [facility] 02/20/2021 2:30 PM Medical Record Number: [id number] Patient Account Number: [id number] Date of Birth/Sex: Treating RN: 02-15-1976 (45 y.o. [name] Primary Care Provider: [name] Other Clinician: Referring Provider: Treating Provider/Extender: Mammie Russian, Ivan Anchors in Treatment: 21 Debridement Performed for Assessment: Wound #6 Right Groin Performed By: Physician Ricard Dillon., MD Debridement Type: Chemical/Enzymatic/Mechanical Agent Used: Swab Level of Consciousness (Pre-procedure): Awake and Alert Pre-procedure Verification/Time Out Yes - 15:20 Taken: Start Time: 15:21 Pain Control: Other : Benzocaine Bleeding: Minimum Hemostasis Achieved: Pressure End Time: 15:24 Response to Treatment: Procedure was tolerated well Level of Consciousness (Post- Awake and Alert procedure): Post Debridement Measurements of Total Wound Length: (cm) 1.5 Width: (cm) 1 Depth: (cm) 7 Volume: (cm) 8.247 Character of Wound/Ulcer Post Debridement: Stable Post Procedure Diagnosis Same as Pre-procedure Electronic Signature(s) Signed: 02/20/2021 5:26:06 PM By: Linton Ham MD Signed: 02/20/2021 5:48:33 PM By: Lorrin Jackson Entered By: Lorrin Jackson on 02/20/2021 15:24:54 -------------------------------------------------------------------------------- HPI Details Patient Name: Date of Service: [name], [name] 02/20/2021 2:30 PM Medical Record Number: [id number] Patient Account Number: [id number] Date of Birth/Sex: Treating RN: Apr 21, 1976 (45 y.o. [name] Primary Care Provider: [name] Other Clinician: Referring Provider: Treating Provider/Extender: Mammie Russian, Ivan Anchors in Treatment: 21 History of Present Illness HPI Description: 05/30/19 patient presents today for initial evaluation our clinic  concerning significant wounds that she has in the right gluteal region as well as in the left abdominal area. She also has a pressure injury to her trans metatarsal amputation site on the right which does not appear to be as looking at this time but nonetheless does need to close. This occurred while she was in the hospital. The patient was admitted to the hospital on 05/17/19 discharged on 05/25/19. This was due to the significant abscess in the right gluteal region as well as in the left panus region. She has a history of diabetes which is definitely not under good control of the most useful view of an agency being 17.5. She also has hypertension, COPD, and is morbidly obese. Unfortunately the wound and the right gluteal region extends right up and adjacent to the anus which is going to make the Wound VAC impossible at this point. Nonetheless I think that we're gonna have to basically pack this with wet to dry packing at this time in order to hopefully achieve improvement. Nonetheless this is a significant wound with her diabetes be not under control along with her size and again the nature of the wound I think this is gonna take a very long time to heal. 07/04/2019 upon evaluation today patient actually appears to be doing better in regard to her wounds. Her foot ulcer actually appears to be completely healed which is great the abdominal ulcer has filled in quite nicely and seems to be doing well although there is a lot of moisture I think that she would benefit from a silver alginate dressing. Nonetheless in regard to the wound on her right gluteal region this showed signs of improvement today still this is a fairly significant ulcer unfortunately. 08/01/2019 on evaluation patient presents for follow-up concerning her wounds over the right gluteal region as well as the left abdominal region. Fortunately both areas are showing signs of improvement which is good news. In fact significant improvement.  Spent about a month since have seen her last. Overall I am extremely happy though with  the findings of how the wounds have progressed at this point. READMISSION 09/22/2020 This is a now 45 year old woman previously in this clinic for a right gluteal abscess for 3 or 4 visits in 2020. She is a poorly controlled type II diabetic with the last hemoglobin A1c I see in epic at 12.7 in July 2021. She tells [place] that she is here for review of a wound on the tip of her right first metatarsal transmetatarsal amputation. She had an angiogram and angioplasty on 12/20/2018 with angioplasty of her anterior tibial and posterior tibial arteries. She has been followed by Dr. Doran Durand most recent treatment since August I think has been Santyl. She was given a 1 week course of Keflex in mid September. The patient's had an x-ray and also an MRI in August 2021 that did not show osteomyelitis but edema ABI in our clinic was 1.19 Past medical history includes hypertension, hyperlipidemia, COPD, bipolar disorder, poorly controlled type 2 diabetes with PAD. Left BKA, right transmetatarsal amputation, heart failure with preserved ejection fraction, she is minimally I am ambulatory walking only from her wheelchair to the bathroom with her walker. She only wears a stocking on her foot. Otherwise she is in wheelchair or in bed all day long. 11/1; patient readmitted to the clinic 2 weeks ago. She has a wound on the tip of the right first metatarsal transmetatarsal amputation site she has been using Santyl. She has Medicaid and we have to apply for assistance if we are going to continue with Santyl although I am hopeful that we will be able to change from the [facility] next week. 11/9; area on the tip of the right first metatarsal amputation site. She has been using Santyl. She was in the hospital overnight for chest pain and they gave her another tube of Santyl which is nice since she has Medicaid and Medicaid would not pay for that.  Her wound is smaller 12/2; tip of the right first metatarsal amputation site. She has been using Santyl. No changes wound dimension however I think the surface of the wound is better 12/28; patient has not been here in 3 and half weeks I think transportation issues. We gave her Hydrofera Blue last time however she switched back to [place]up last week because somebody told her the wound was larger. The wound is actually measuring smaller and looks healthy. 1/13; since the patient was last here she was actually seen twice in the emergency department most recently on 12/30 this was for a painful small area on the right posterior calf superiorly. Previously she was seen in the ER on 12/19 for this problem but she did not tell us about this the last time she was here. There was some question of this being a spider bite based I think on somebody killing a spider in her apartment. When she was in the ER on 12/30 she complained of increasing pain and swelling. She had previously been given doxycycline but they treated with IV clindamycin transitioning to oral clindamycin. She is still using Hydrofera Blue to the area on the medial transmit amputation on the right this is not changed much today 1/27; 2-week follow-up. The patient has Hydrofera Blue on the medial transmet amputation site. This looks better this week after debridement last week. She has a new area on the right posterior calf that we looked at last time she has been using Santyl to this that she had leftover from the days where Medicaid paid for this. 2/3; she did  not tolerate the Iodoflex well on the back of her leg although the surface of it looks a lot better today. She was approved for Santyl to this area through compassionate release. We are using Hydrofera Blue on the transmet wound on the first met head 2/10; we have been using Santyl on the posterior right leg and the wound on the medial first met head. 02/06/2021 upon evaluation today  patient appears to be doing poorly in regard to her posterior leg as well as her foot ulcer. Both are showing signs of erythema and potentially infection at this point as well. There does not appear to be any signs of active infection locally that is a different story. There does not appear to be any signs of overall worsening with regard to the wounds but they are also not doing quite as well as it appears it was previous. I think that the [facility] however is a better dressing probably we can switch to using this on the posterior leg as well. Collagen may be appropriate at some point but I do not think were quite there yet. 3/18; the patient has a wound on her posterior right leg and the anterior part of her transmet site on the right foot. She was started on an antibiotic 2 weeks ago Levaquin she is still has 3 days above left. We have been using Hydrofera Blue in general the areas look better. HOWEVER she arrives in clinic today with a concern about an abscess in her right groin. Apparently she discovered this recently. Her daughter looked at it last night. Electronic Signature(s) Signed: 02/20/2021 5:26:06 PM By: Linton Ham MD Entered By: Linton Ham on 02/20/2021 16:28:00 -------------------------------------------------------------------------------- Physical Exam Details Patient Name: Date of Service: [name], [name] 02/20/2021 2:30 PM Medical Record Number: [id number] Patient Account Number: [id number] Date of Birth/Sex: Treating RN: September 08, 1976 (45 y.o. [name] Primary Care Provider: [name] Other Clinician: Referring Provider: Treating Provider/Extender: Mammie Russian, Ivan Anchors in Treatment: 21 Constitutional Sitting or standing Blood Pressure is within target range for patient.. Pulse regular and within target range for patient.Marland Kitchen Respirations regular, non-labored and within target range.. Temperature is normal and within the target  range for the patient.Marland Kitchen Appears in no distress. Notes Wound exam; The area of patient's original wounds on the right transmet at roughly toe 2 or 1 seems better than last time I saw this also the area on the right posterior calf which was a subsequent wound in this clinic. We have been using Hydrofera Blue to these areas The area on the right groin is in the midst of folds of pannus tissue likely lymphedema. We had to use multiple staff members to hold up the tissue. However when we get that this area which is close to her inguinal area there is an open area. Purulent drainage exploring this area resulted in collapse of the underlying tissue. This probes laterally about 6 cm. This did not seem to tunnel inward. I did a culture of this area. Electronic Signature(s) Signed: 02/20/2021 5:26:06 PM By: Linton Ham MD Entered By: Linton Ham on 02/20/2021 16:29:45 -------------------------------------------------------------------------------- Physician Orders Details Patient Name: Date of Service: [name], [name] 02/20/2021 2:30 PM Medical Record Number: [id number] Patient Account Number: [id number] Date of Birth/Sex: Treating RN: 21-Oct-1976 (45 y.o. 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 Bathing/ Shower/ Hygiene May shower with protection but do not get wound dressing(s) wet.  - use a cast protector when not changing the dressing. May shower and wash wound with soap and water. - with dressing changes only. Edema Control - Lymphedema / SCD / Other Right Lower Extremity Elevate legs to the level of the heart or above for 30 minutes daily and/or when sitting, a frequency of: - 3-4 times throughout the day. Avoid standing for long periods of time. Exercise regularly - may wear diabetic shoes now, may restart physical therapy, may start walking. Just ensure the filler in the diabetic shoe does not rub on the wound area. Moisturize legs daily. Off-Loading Other: - ensure no pressure to foot wound. Wound Treatment Wound #4 - Amputation Site - Transmetatarsal Wound Laterality: Right Cleanser: Soap and Water Every Other Day/30 Days Discharge Instructions: May shower and wash wound with dial antibacterial soap and water prior to dressing change. Cleanser: Normal Saline (Generic) Every Other Day/30 Days Discharge Instructions: Cleanse the wound with Normal Saline prior to applying a clean dressing using gauze sponges, not tissue or cotton balls. Prim Dressing: Hydrofera Blue Ready Foam, 4x5 in Every Other Day/30 Days ary Discharge Instructions: Apply to wound bed as instructed. moisten with saline. Secondary Dressing: Woven Gauze Sponge, Non-Sterile 4x4 in (Generic) Every Other Day/30 Days Discharge Instructions: Apply over primary dressing as directed. Secured With: [employer], 4.5x3.1 (in/yd) (Generic) Every Other Day/30 Days Discharge Instructions: Secure with Kerlix as directed. Secured With: Paper Tape, 2x10 (in/yd) (Generic) Every Other Day/30 Days Discharge Instructions: Secure dressing with tape as directed. Wound #5 - Lower Leg Wound Laterality: Right, Posterior Cleanser: Soap and Water 1 x Per Day/30 Days Discharge Instructions: May shower and wash wound with dial antibacterial soap and water prior to dressing change. Cleanser: Wound Cleanser 1 x Per  Day/30 Days Discharge Instructions: Cleanse the wound with wound cleanser prior to applying a clean dressing using gauze sponges, not tissue or cotton balls. Prim Dressing: Hydrofera Blue Classic Foam, 4x4 in 1 x Per Day/30 Days ary Discharge Instructions: Moisten with saline prior to applying to wound bed Secondary Dressing: Woven Gauze Sponge, Non-Sterile 4x4 in 1 x Per Day/30 Days Discharge Instructions: Apply over primary dressing as directed. Secured With: 16M Medipore H Soft Cloth Surgical T 4 x 2 (in/yd) 1 x Per Day/30 Days ape Discharge Instructions: Secure dressing with tape as directed. Patient Medications llergies: cefepime, Trental, Nubain, kiwi, morphine A Notifications Medication Indication Start End groing abscess 02/20/2021 doxycycline monohydrate DOSE oral 100 mg capsule - 1capsule oral bid for 7 days (in addition to Levaquin) Electronic Signature(s) Signed: 02/20/2021 3:27:58 PM By: Linton Ham MD Previous Signature: 02/20/2021 2:23:09 PM Version By: Lorrin Jackson Entered By: Linton Ham on 02/20/2021 15:27:58 -------------------------------------------------------------------------------- Problem List Details Patient Name: Date of Service: [name], [name] 02/20/2021 2:30 PM Medical Record Number: [id number] Patient Account Number: [id number] Date of Birth/Sex: Treating RN: [birth year] (45 y.o. 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 [name] Primary Care Provider: [name] Other Clinician: Referring Provider: Treating Provider/Extender: Mammie Russian, Ivan Anchors in Treatment: 21 Subjective History of Present Illness (HPI) 05/30/19 patient presents today for initial evaluation our clinic concerning significant wounds that she has in the right gluteal region as well as in the left abdominal area. She also has a pressure injury to her trans metatarsal amputation site on the right which does not appear to be as looking at this time but nonetheless does need to close. This occurred while she was in the hospital. The patient was admitted to the hospital on 05/17/19 discharged on 05/25/19. This was due to the significant abscess in the right gluteal region as well as in the left panus region. She has a history of diabetes which is definitely not under good control of the most useful view of an agency being 17.5. She also has hypertension, COPD, and is morbidly obese. Unfortunately the wound and the right gluteal region extends right up and adjacent to the anus which is going to make the Wound VAC impossible at this point. Nonetheless I think that we're gonna have to basically pack this with wet to dry packing at  this time in order to hopefully achieve improvement. Nonetheless this is a significant wound with her diabetes be not under control along with her size and again the nature of the wound I think this is gonna take a very long time to heal. 07/04/2019 upon evaluation today patient actually appears to be doing better in regard to her wounds. Her foot ulcer actually appears to be completely healed which is great the abdominal ulcer has filled in quite nicely and seems to be doing well although there is a lot of moisture I think that she would benefit from a silver alginate dressing. Nonetheless in regard to the wound on her right gluteal region this showed signs of improvement today still this is a fairly significant ulcer unfortunately. 08/01/2019 on evaluation patient presents for follow-up concerning her wounds over the right gluteal region as well as the left abdominal region. Fortunately both areas are showing signs of improvement which is good news. In fact significant improvement. Spent about a month since have seen her last. Overall I am extremely happy though with the findings of how the wounds have progressed at this point. READMISSION 09/22/2020 This is a now 45 year old woman previously in this clinic for a right gluteal abscess for 3 or 4 visits in 2020. She is a poorly controlled type II diabetic with the last hemoglobin A1c I see in epic at 12.7 in July 2021. She tells [place] that she is here for review of a wound on the tip of her right  first metatarsal transmetatarsal amputation. She had an angiogram and angioplasty on 12/20/2018 with angioplasty of her anterior tibial and posterior tibial arteries. She has been followed by Dr. Doran Durand most recent treatment since August I think has been Santyl. She was given a 1 week course of Keflex in mid September. The patient's had an x-ray and also an MRI in August 2021 that did not show osteomyelitis but edema ABI in our clinic was 1.19 Past medical  history includes hypertension, hyperlipidemia, COPD, bipolar disorder, poorly controlled type 2 diabetes with PAD. Left BKA, right transmetatarsal amputation, heart failure with preserved ejection fraction, she is minimally I am ambulatory walking only from her wheelchair to the bathroom with her walker. She only wears a stocking on her foot. Otherwise she is in wheelchair or in bed all day long. 11/1; patient readmitted to the clinic 2 weeks ago. She has a wound on the tip of the right first metatarsal transmetatarsal amputation site she has been using Santyl. She has Medicaid and we have to apply for assistance if we are going to continue with Santyl although I am hopeful that we will be able to change from the [facility] next week. 11/9; area on the tip of the right first metatarsal amputation site. She has been using Santyl. She was in the hospital overnight for chest pain and they gave her another tube of Santyl which is nice since she has Medicaid and Medicaid would not pay for that. Her wound is smaller 12/2; tip of the right first metatarsal amputation site. She has been using Santyl. No changes wound dimension however I think the surface of the wound is better 12/28; patient has not been here in 3 and half weeks I think transportation issues. We gave her Hydrofera Blue last time however she switched back to [place] last week because somebody told her the wound was larger. The wound is actually measuring smaller and looks healthy. 1/13; since the patient was last here she was actually seen twice in the emergency department most recently on 12/30 this was for a painful small area on the right posterior calf superiorly. Previously she was seen in the ER on 12/19 for this problem but she did not tell us about this the last time she was here. There was some question of this being a spider bite based I think on somebody killing a spider in her apartment. When she was in the ER on 12/30 she complained  of increasing pain and swelling. She had previously been given doxycycline but they treated with IV clindamycin transitioning to oral clindamycin. She is still using Hydrofera Blue to the area on the medial transmit amputation on the right this is not changed much today 1/27; 2-week follow-up. The patient has Hydrofera Blue on the medial transmet amputation site. This looks better this week after debridement last week. She has a new area on the right posterior calf that we looked at last time she has been using Santyl to this that she had leftover from the days where Medicaid paid for this. 2/3; she did not tolerate the Iodoflex well on the back of her leg although the surface of it looks a lot better today. She was approved for Santyl to this area through compassionate release. We are using Hydrofera Blue on the transmet wound on the first met head 2/10; we have been using Santyl on the posterior right leg and the wound on the medial first met head. 02/06/2021 upon evaluation today patient appears to be  doing poorly in regard to her posterior leg as well as her foot ulcer. Both are showing signs of erythema and potentially infection at this point as well. There does not appear to be any signs of active infection locally that is a different story. There does not appear to be any signs of overall worsening with regard to the wounds but they are also not doing quite as well as it appears it was previous. I think that the [facility] however is a better dressing probably we can switch to using this on the posterior leg as well. Collagen may be appropriate at some point but I do not think were quite there yet. 3/18; the patient has a wound on her posterior right leg and the anterior part of her transmet site on the right foot. She was started on an antibiotic 2 weeks ago Levaquin she is still has 3 days above left. We have been using Hydrofera Blue in general the areas look better. HOWEVER she arrives  in clinic today with a concern about an abscess in her right groin. Apparently she discovered this recently. Her daughter looked at it last night. Objective Constitutional Sitting or standing Blood Pressure is within target range for patient.. Pulse regular and within target range for patient.Marland Kitchen Respirations regular, non-labored and within target range.. Temperature is normal and within the target range for the patient.Marland Kitchen Appears in no distress. Vitals Time Taken: 2:30 PM, Height: 62 in, Weight: 362 lbs, BMI: 66.2, Temperature: 97.7 F, Pulse: 92 bpm, Respiratory Rate: 16 breaths/min, Blood Pressure: 101/74 mmHg. General Notes: Wound exam; ooThe area of patient's original wounds on the right transmet at roughly toe 2 or 1 seems better than last time I saw this also the area on the right posterior calf which was a subsequent wound in this clinic. We have been using Hydrofera Blue to these areas ooThe area on the right groin is in the midst of folds of pannus tissue likely lymphedema. We had to use multiple staff members to hold up the tissue. However when we get that this area which is close to her inguinal area there is an open area. Purulent drainage exploring this area resulted in collapse of the underlying tissue. This probes laterally about 6 cm. This did not seem to tunnel inward. I did a culture of this area. Integumentary (Hair, Skin) Wound #4 status is Open. Original cause of wound was Gradually Appeared. The date acquired was: 03/06/2020. The wound has been in treatment 21 weeks. 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 The wound measures 0.9cm length x 0.5cm width x 0.3cm depth; 0.353cm^2 area and 0.106cm^3 volume. There is Fat Layer (Subcutaneous Tissue) exposed. There is no tunneling noted, however, there is undermining starting at 12:00 and ending at 12:00 with a maximum distance of 0.3cm. There is a medium amount of serosanguineous drainage noted. The wound  margin is distinct with the outline attached to the wound base. There is large (67-100%) red granulation within the wound bed. There is no necrotic tissue within the wound bed. Wound #5 status is Open. Original cause of wound was Not Known. The date acquired was: 11/05/2020. The wound has been in treatment 9 weeks. The wound is located on the Right,Posterior Lower Leg. The wound measures 1.3cm length x 1.4cm width x 0.1cm depth; 1.429cm^2 area and 0.143cm^3 volume. There is Fat Layer (Subcutaneous Tissue) exposed. There is no tunneling or undermining noted. There is a medium amount of serosanguineous drainage noted.  The wound margin is flat and intact. There is large (67-100%) red granulation within the wound bed. There is a small (1-33%) amount of necrotic tissue within the wound bed including Adherent Slough. Wound #6 status is Open. Original cause of wound was Blister. The date acquired was: 02/20/2021. The wound is located on the Right Groin. The wound measures 1.5cm length x 1cm width x 7cm depth; 1.178cm^2 area and 8.247cm^3 volume. There is Fat Layer (Subcutaneous Tissue) exposed. There is no tunneling or undermining noted. There is a large amount of serous drainage noted. The wound margin is well defined and not attached to the wound base. There is small (1-33%) pink granulation within the wound bed. There is a large (67-100%) amount of necrotic tissue within the wound bed including Adherent Slough. 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 There was a Chemical/Enzymatic/Mechanical debridement  performed by Ricard Dillon., MD. after achieving pain control using Other (Benzocaine). Other agent used was Swab. A time out was conducted at 15:20, prior to the start of the procedure. A Minimum amount of bleeding was controlled with Pressure. The procedure was tolerated well. Post Debridement Measurements: 1.5cm length x 1cm width x 7cm depth; 8.247cm^3 volume. Character of Wound/Ulcer Post Debridement is stable. Post procedure Diagnosis Wound #6: Same as Pre-Procedure Plan Follow-up Appointments: Return Appointment in 2 weeks. Bathing/ Shower/ Hygiene: May shower with protection but do not get wound dressing(s) wet. - use a cast protector when not changing the dressing. May shower and wash wound with soap and water. - with dressing changes only. Edema Control - Lymphedema / SCD / Other: Elevate legs to the level of the heart or above for 30 minutes daily and/or when sitting, a frequency of: - 3-4 times throughout the day. Avoid standing for long periods of time. Exercise regularly - may wear diabetic shoes now, may restart physical therapy, may start walking. Just ensure the filler in the diabetic shoe does not rub on the wound area. Moisturize legs daily. Off-Loading: Other: - ensure no pressure to foot wound. The following medication(s) was prescribed: doxycycline monohydrate oral 100 mg capsule 1capsule oral bid for 7 days (in addition to Levaquin) for groing abscess starting 02/20/2021 WOUND #4: - Amputation Site - Transmetatarsal Wound Laterality: Right Cleanser: Soap and Water Every Other Day/30 Days Discharge Instructions: May shower and wash wound with dial antibacterial soap and water prior to dressing change. Cleanser: Normal Saline (Generic) Every Other Day/30 Days Discharge Instructions: Cleanse the wound with Normal Saline prior to applying a clean dressing using gauze sponges, not tissue or cotton balls. Prim Dressing: Hydrofera Blue Ready Foam, 4x5 in Every Other  Day/30 Days ary Discharge Instructions: Apply to wound bed as instructed. moisten with saline. Secondary Dressing: Woven Gauze Sponge, Non-Sterile 4x4 in (Generic) Every Other Day/30 Days Discharge Instructions: Apply over primary dressing as directed. Secured With: [employer], 4.5x3.1 (in/yd) (Generic) Every Other Day/30 Days Discharge Instructions: Secure with Kerlix as directed. Secured With: Paper T ape, 2x10 (in/yd) (Generic) Every Other Day/30 Days Discharge Instructions: Secure dressing with tape as directed. WOUND #5: - Lower Leg Wound Laterality: Right, Posterior Cleanser: Soap and  Water 1 x Per Day/30 Days Discharge Instructions: May shower and wash wound with dial antibacterial soap and water prior to dressing change. Cleanser: Wound Cleanser 1 x Per Day/30 Days Discharge Instructions: Cleanse the wound with wound cleanser prior to applying a clean dressing using gauze sponges, not tissue or cotton balls. Prim Dressing: Hydrofera Blue Classic Foam, 4x4 in 1 x Per Day/30 Days ary Discharge Instructions: Moisten with saline prior to applying to wound bed Secondary Dressing: Woven Gauze Sponge, Non-Sterile 4x4 in 1 x Per Day/30 Days Discharge Instructions: Apply over primary dressing as directed. Secured With: 33M Medipore H Soft Cloth Surgical T 4 x 2 (in/yd) 1 x Per Day/30 Days ape Discharge Instructions: Secure dressing with tape as directed. 1. We continued with the Hydrofera Blue to the wounds on the right foot and right leg these look better. She is completing her Levaquin prescribed last time 2. Probable groin abscess. I cultured this area and gave her empiric doxycycline which we she will continue after the Levaquin is finished. This is not going to be easy to dress partially because it is buried in folds of tissue and lymphedema pannus folds and partially because she is anxious and yelling. She says her daughter can do this I am not certain 3. We dressed this area  in the pannus with silver alginate. They are going to need to pack this lightly in the area whether she will allow this or not I am not sure 4. The culture will come back sometime next week as mentioned she is now on doxycycline for 7 days. 5. 1 week follow-up Electronic Signature(s) Signed: 02/20/2021 5:26:06 PM By: Linton Ham MD Entered By: Linton Ham on 02/20/2021 16:31:37 -------------------------------------------------------------------------------- SuperBill Details Patient Name: Date of Service: [name], [facility] 02/20/2021 Medical Record Number: [id number] Patient Account Number: [id number] Date of Birth/Sex: Treating RN: 28-Dec-1975 (45 y.o. [name] Primary Care Provider: [name] Other Clinician: Referring Provider: Treating Provider/Extender: Mammie Russian, Ivan Anchors in Treatment: 21 Diagnosis Coding ICD-10 Codes Code Description E11.621 Type 2 diabetes mellitus with foot ulcer L97.512 Non-pressure chronic ulcer of other part of right foot with fat layer exposed Z89.512 Acquired absence of left leg below knee E11.51 Type 2 diabetes mellitus with diabetic peripheral angiopathy without gangrene L97.218 Non-pressure chronic ulcer of right calf with other specified severity L02.214 Cutaneous abscess of groin Facility Procedures CPT4 Code: 98286751 Description: 98242 - DEBRIDE W/O ANES NON SELECT Modifier: Quantity: 1 Physician Procedures : CPT4 Code Description Modifier [id number] - WC PHYS LEVEL 4 - EST PT ICD-10 Diagnosis Description L02.214 Cutaneous abscess of groin L97.218 Non-pressure chronic ulcer of right calf with other specified severity L97.512 Non-pressure chronic ulcer of  other part of right foot with fat layer exposed E11.621 Type 2 diabetes mellitus with foot ulcer Quantity: 1 Electronic Signature(s) Signed: 02/20/2021 5:26:06 PM By: Linton Ham MD Entered By: Linton Ham on 02/20/2021 16:34:18

## 2021-02-20 NOTE — Progress Notes (Addendum)
VIDA, NICOL ([id number]) Visit Report for 02/20/2021 Arrival Information Details Patient Name: Date of Service: [name] [facility] 02/20/2021 2:30 PM Medical Record Number: [id number] Patient Account Number: [id number] Date of Birth/Sex: Treating RN: Jan 20, 1976 (45 y.o. Benjamine Sprague, Briant Cedar Primary Care Filippo Puls: [name] Other Clinician: Referring Anetta Olvera: Treating Vista Sawatzky/Extender: Mammie Russian, Ivan Anchors in Treatment: 21 Visit Information History Since Last Visit Added or deleted any medications: No Patient Arrived: Wheel Chair Any new allergies or adverse reactions: No Arrival Time: 14:30 Had a fall or experienced change in No Accompanied By: alone activities of daily living that may affect Transfer Assistance: None risk of falls: Patient Identification Verified: Yes Signs or symptoms of abuse/neglect since last visito No Secondary Verification Process Completed: Yes Hospitalized since last visit: No Patient Requires Transmission-Based Precautions: No Implantable device outside of the clinic excluding No Patient Has Alerts: No cellular tissue based products placed in the center since last visit: Has Dressing in Place as Prescribed: Yes Pain Present Now: No Electronic Signature(s) Signed: 02/23/2021 5:12:11 PM By: Levan Hurst RN, BSN Entered By: Levan Hurst on 02/20/2021 14:47:46 -------------------------------------------------------------------------------- Encounter Discharge Information Details Patient Name: Date of Service: [name], [name] 02/20/2021 2:30 PM Medical Record Number: [id number] Patient Account Number: [id number] Date of Birth/Sex: Treating RN: Apr 30, 1976 (45 y.o. [name] Primary Care Dru Laurel: [name] Other Clinician: Referring Langford Carias: Treating Krithika Tome/Extender: Mammie Russian, Ivan Anchors in Treatment: 21 Encounter Discharge Information Items Post Procedure Vitals Discharge Condition:  Stable Temperature (F): 97.7 Ambulatory Status: Wheelchair Pulse (bpm): 92 Discharge Destination: Home Respiratory Rate (breaths/min): 16 Transportation: Private Auto Blood Pressure (mmHg): 101/74 Accompanied By: self Schedule Follow-up Appointment: Yes Clinical Summary of Care: Electronic Signature(s) Signed: 02/20/2021 5:28:19 PM By: Deon Pilling Entered By: Deon Pilling on 02/20/2021 17:17:20 -------------------------------------------------------------------------------- Multi Wound Chart Details Patient Name: Date of Service: [name], Belinda [facility] 02/20/2021 2:30 PM Medical Record Number: [id number] Patient Account Number: [id number] Date of Birth/Sex: Treating RN: 1976/11/01 (45 y.o. [name] Primary Care Haruo Stepanek: [name] Other Clinician: Referring Harlei Lehrmann: Treating Senai Kingsley/Extender: Mammie Russian, Ivan Anchors in Treatment: 21 Vital Signs Height(in): 23 Pulse(bpm): 32 Weight(lbs): 30 Blood Pressure(mmHg): 101/74 Body Mass Index(BMI): 11 Temperature(F): 97.7 Respiratory Rate(breaths/min): 16 Photos: [4:No Photos Right Amputation Site -] [5:No Photos Right, Posterior Lower Leg] [6:No Photos Right Groin] Wound Location: [4:Transmetatarsal Gradually Appeared] [5:Not Known] [6:Blister] Wounding Event: [4:Diabetic Wound/Ulcer of the Lower] [5:Diabetic Wound/Ulcer of the Lower] [6:Abscess] Primary Etiology: [4:Extremity Asthma, Chronic Obstructive] [5:Extremity Asthma, Chronic Obstructive] [6:Asthma, Chronic Obstructive] Comorbid History: [4:Pulmonary Disease (COPD), Sleep Apnea, Congestive Heart Failure, Hypertension, Peripheral Venous Disease, Type II Diabetes, Osteoarthritis, Osteomyelitis, Neuropathy 03/06/2020] [5:Pulmonary Disease (COPD), Sleep Apnea, Congestive  Heart Failure, Hypertension, Peripheral Venous Disease, Type II Diabetes, Osteoarthritis, Osteomyelitis, Neuropathy 11/05/2020] [6:Pulmonary Disease (COPD), Sleep Apnea, Congestive  Heart Failure, Hypertension, Peripheral Venous Disease, Type II Diabetes,  Osteoarthritis, Osteomyelitis, Neuropathy 02/20/2021] Date Acquired: [4:21] [5:9] [6:0] Weeks of Treatment: [4:Open] [5:Open] [6:Open] Wound Status: [4:0.9x0.5x0.3] [5:1.3x1.4x0.1] [6:1.5x1x7] Measurements L x W x D (cm) [4:0.353] [5:1.429] [6:1.178] A (cm) : rea [4:0.106] [5:0.143] [6:8.247] Volume (cm) : [4:59.10%] [5:36.80%] [6:N/A] % Reduction in A rea: [4:59.10%] [5:36.70%] [6:N/A] % Reduction in Volume: [4:12] Starting Position 1 (o'clock): [4:12] Ending Position 1 (o'clock): [4:0.3] Maximum Distance 1 (cm): [4:Yes] [5:No] [6:No] Undermining: [4:Grade 2] [5:Grade 2] [6:Full Thickness Without Exposed] Classification: [4:Medium] [5:Medium] [6:Support Structures Large] Exudate A mount: [4:Serosanguineous] [5:Serosanguineous] [6:Serous] Exudate Type: [4:red, brown] [5:red, brown] [6:amber] Exudate Color: [4:Distinct, outline attached] [5:Flat and Intact] [6:Well  defined, not attached] Wound Margin: [4:Large (67-100%)] [5:Large (67-100%)] [6:Small (1-33%)] Granulation A mount: [4:Red] [5:Red] [6:Pink] Granulation Quality: [4:None Present (0%)] [5:Small (1-33%)] [6:Large (67-100%)] Necrotic A mount: [4:Fat Layer (Subcutaneous Tissue): Yes Fat Layer (Subcutaneous Tissue): Yes Fat Layer (Subcutaneous Tissue): Yes] Exposed Structures: [4:Fascia: No Tendon: No Muscle: No Joint: No Bone: No Medium (34-66%)] [5:Fascia: No Tendon: No Muscle: No Joint: No Bone: No Small (1-33%)] [6:Fascia: No Tendon: No Muscle: No Joint: No Bone: No None] Epithelialization: [4:N/A] [5:N/A] [6:Chemical/Enzymatic/Mechanical] Debridement: Pre-procedure Verification/Time Out N/A [5:N/A] [6:15:20] Taken: [4:N/A] [5:N/A] [6:Other] Pain Control: [4:N/A] [5:N/A] [6:N/A] Instrument: [4:N/A] [5:N/A] [6:Minimum] Bleeding: [4:N/A] [5:N/A] [6:Pressure] Hemostasis A chieved: [4:N/A] [5:N/A] [6:Procedure was tolerated well] Debridement Treatment  Response: [4:N/A] [5:N/A] [6:1.5x1x7] Post Debridement Measurements L x W x D (cm) [4:N/A] [5:N/A] [6:8.247] Post Debridement Volume: (cm) [4:N/A] [5:N/A] [6:Debridement] Treatment Notes Electronic Signature(s) Signed: 02/20/2021 5:26:06 PM By: Linton Ham MD Signed: 02/20/2021 5:48:33 PM By: Lorrin Jackson Previous Signature: 02/20/2021 3:52:50 PM Version By: Lorrin Jackson Entered By: Linton Ham on 02/20/2021 16:26:46 -------------------------------------------------------------------------------- Multi-Disciplinary Care Plan Details Patient Name: Date of Service: [name], Belinda [facility] 02/20/2021 2:30 PM Medical Record Number: [id number] Patient Account Number: [id number] Date of Birth/Sex: Treating RN: 1976/10/20 (46 y.o. [name] Primary Care Agripina Guyette: [name] Other Clinician: Referring Orhan Mayorga: Treating Lyncoln Ledgerwood/Extender: Mammie Russian, Ivan Anchors in Treatment: 21 Multidisciplinary Care Plan reviewed with physician Active Inactive Abuse / Safety / Falls / Self Care Management Nursing Diagnoses: History of Falls Potential for injury related to falls Goals: Patient will not experience any injury related to falls Date Initiated: 09/22/2020 Target Resolution Date: 03/06/2021 Goal Status: Active Patient/caregiver will verbalize/demonstrate measures taken to prevent injury and/or falls Date Initiated: 09/22/2020 Target Resolution Date: 03/06/2021 Goal Status: Active Interventions: Assess Activities of Daily Living upon admission and as needed Assess fall risk on admission and as needed Assess: immobility, friction, shearing, incontinence upon admission and as needed Assess impairment of mobility on admission and as needed per policy Assess personal safety and home safety (as indicated) on admission and as needed Assess self care needs on admission and as needed Provide education on fall prevention Notes: Nutrition Nursing  Diagnoses: Impaired glucose control: actual or potential Potential for alteratiion in Nutrition/Potential for imbalanced nutrition Goals: Patient/caregiver agrees to and verbalizes understanding of need to use nutritional supplements and/or vitamins as prescribed Date Initiated: 09/22/2020 Date Inactivated: 11/06/2020 Target Resolution Date: 11/06/2020 Goal Status: Met Patient/caregiver will maintain therapeutic glucose control Date Initiated: 09/22/2020 Target Resolution Date: 03/06/2021 Goal Status: Active Interventions: Assess HgA1c results as ordered upon admission and as needed Assess patient nutrition upon admission and as needed per policy Provide education on elevated blood sugars and impact on wound healing Provide education on nutrition Treatment Activities: Education provided on Nutrition : 10/06/2020 Notes: Electronic Signature(s) Signed: 02/20/2021 2:23:19 PM By: Lorrin Jackson Entered By: Lorrin Jackson on 02/20/2021 14:23:18 -------------------------------------------------------------------------------- Pain Assessment Details Patient Name: Date of Service: [name] [facility] 02/20/2021 2:30 PM Medical Record Number: [id number] Patient Account Number: [id number] Date of Birth/Sex: Treating RN: [birth year] (45 y.o. [name] Primary Care Jermiya Reichl: [name] Other Clinician: Referring Xariah Silvernail: Treating Lailany Enoch/Extender: Mammie Russian, Ivan Anchors in Treatment: 21 Active Problems Location of Pain Severity and Description of Pain Patient Has Paino No Site Locations Pain Management and Medication Current Pain Management: Electronic Signature(s) Signed: 02/23/2021 5:12:11 PM By: Levan Hurst RN, BSN Entered By: Levan Hurst on 02/20/2021 14:48:39 -------------------------------------------------------------------------------- Patient/Caregiver Education Details Patient Name: Date of Service: [name] [facility] 3/18/2022andnbsp2:30  PM Medical Record Number: [id number] Patient Account Number: [id number] Date of Birth/Gender: Treating RN: [birth year] (45 y.o. [name] Primary Care Physician: [name] Other Clinician: Referring Physician: Treating Physician/Extender: Mammie Russian, Ivan Anchors in Treatment: 21 Education Assessment Education Provided To: Patient Education Topics Provided Elevated Blood Sugar/ Impact on Healing: Methods: Explain/Verbal Responses: State content correctly Infection: Methods: Explain/Verbal Responses: State content correctly Wound/Skin Impairment: Methods: Demonstration, Explain/Verbal, Printed Responses: State content correctly Electronic Signature(s) Signed: 02/20/2021 5:48:33 PM By: Lorrin Jackson Entered By: Lorrin Jackson on 02/20/2021 14:24:47 -------------------------------------------------------------------------------- Wound Assessment Details Patient Name: Date of Service: [name], Belinda [facility] 02/20/2021 2:30 PM Medical Record Number: [id number] Patient Account Number: [id number] Date of Birth/Sex: Treating RN: 02-Dec-1976 (45 y.o. 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 Wound Open Wounding Event: Gradually Appeared Status: Date Acquired: 03/06/2020 Comorbid Asthma, Chronic Obstructive Pulmonary Disease (COPD), Sleep Weeks Of Treatment: 21 History: Apnea, Congestive Heart Failure, Hypertension, Peripheral Venous Clustered Wound: No Disease, Type II Diabetes, Osteoarthritis, Osteomyelitis, Neuropathy Photos Wound Measurements Length: (cm) 0.9 Width: (cm) 0.5 Depth: (cm) 0.3 Area: (cm) 0.353 Volume: (cm) 0.106 % Reduction in Area: 59.1% %  Reduction in Volume: 59.1% Epithelialization: Medium (34-66%) Tunneling: No Undermining: Yes Starting Position (o'clock): 12 Ending Position (o'clock): 12 Maximum Distance: (cm) 0.3 Wound Description Classification: Grade 2 Wound Margin: Distinct, outline attached Exudate Amount: Medium Exudate Type: Serosanguineous Exudate Color: red, brown Foul Odor After Cleansing: No Slough/Fibrino No Wound Bed Granulation Amount: Large (67-100%) Exposed Structure Granulation Quality: Red Fascia Exposed: No Necrotic Amount: None Present (0%) Fat Layer (Subcutaneous Tissue) Exposed: Yes Tendon Exposed: No Muscle Exposed: No Joint Exposed: No Bone Exposed: No Treatment Notes Wound #4 (Amputation Site - Transmetatarsal) Wound Laterality: Right Cleanser Soap and Water Discharge Instruction: May shower and wash wound with dial antibacterial soap and water prior to dressing change. Normal Saline Discharge Instruction: Cleanse the wound with Normal Saline prior to applying a clean dressing using gauze sponges, not tissue or cotton balls. Peri-Wound Care Topical Primary Dressing Hydrofera Blue Ready Foam, 4x5 in Discharge Instruction: Apply to wound bed as instructed. moisten with saline. Secondary Dressing Woven Gauze Sponge, Non-Sterile 4x4 in Discharge Instruction: Apply over primary dressing as directed. Secured With [employer], 4.5x3.1 (in/yd) Discharge Instruction: Secure with Kerlix as directed. Paper Tape, 2x10 (in/yd) Discharge Instruction: Secure dressing with tape as directed. Compression Wrap Compression Stockings Add-Ons Electronic Signature(s) Signed: 02/23/2021 7:47:40 AM By: Sandre Kitty Signed: 02/23/2021 5:12:11 PM By: Levan Hurst RN, BSN Entered By: Sandre Kitty on 02/22/2021 12:33:19 -------------------------------------------------------------------------------- Wound Assessment Details Patient Name: Date of Service: [name], [name] 02/20/2021  2:30 PM Medical Record Number: [id number] Patient Account Number: [id number] Date of Birth/Sex: Treating RN: September 03, 1976 (45 y.o. [name] Primary Care Doniven Vanpatten: [name] Other Clinician: Referring Kathelyn Gombos: Treating Humbert Morozov/Extender: Mammie Russian, Ivan Anchors in Treatment: 21 Wound Status Wound Number: 5 Primary Diabetic Wound/Ulcer of the Lower Extremity Etiology: Wound Location: Right, Posterior Lower Leg Wound Open Wounding Event: Not Known Status: Status: Date Acquired: 11/05/2020 Comorbid Asthma, Chronic Obstructive Pulmonary Disease (COPD), Sleep Weeks Of Treatment: 9 History: Apnea, Congestive Heart Failure, Hypertension, Peripheral Venous Clustered Wound: No Disease, Type II Diabetes, Osteoarthritis, Osteomyelitis, Neuropathy Photos Wound Measurements Length: (cm) 1.3 Width: (cm) 1.4 Depth: (cm)  0.1 Area: (cm) 1.429 Volume: (cm) 0.143 % Reduction in Area: 36.8% % Reduction in Volume: 36.7% Epithelialization: Small (1-33%) Tunneling: No Undermining: No Wound Description Classification: Grade 2 Wound Margin: Flat and Intact Exudate Amount: Medium Exudate Type: Serosanguineous Exudate Color: red, brown Foul Odor After Cleansing: No Slough/Fibrino Yes Wound Bed Granulation Amount: Large (67-100%) Exposed Structure Granulation Quality: Red Fascia Exposed: No Necrotic Amount: Small (1-33%) Fat Layer (Subcutaneous Tissue) Exposed: Yes Necrotic Quality: Adherent Slough Tendon Exposed: No Muscle Exposed: No Joint Exposed: No Bone Exposed: No Treatment Notes Wound #5 (Lower Leg) Wound Laterality: Right, Posterior Cleanser Soap and Water Discharge Instruction: May shower and wash wound with dial antibacterial soap and water prior to dressing change. Wound Cleanser Discharge Instruction: Cleanse the wound with wound cleanser prior to applying a clean dressing using gauze sponges, not tissue or cotton balls. Peri-Wound  Care Topical Primary Dressing Hydrofera Blue Classic Foam, 4x4 in Discharge Instruction: Moisten with saline prior to applying to wound bed Secondary Dressing Woven Gauze Sponge, Non-Sterile 4x4 in Discharge Instruction: Apply over primary dressing as directed. Secured With 63M Medipore H Soft Cloth Surgical T 4 x 2 (in/yd) ape Discharge Instruction: Secure dressing with tape as directed. Compression Wrap Compression Stockings Add-Ons Electronic Signature(s) Signed: 02/23/2021 7:47:40 AM By: Sandre Kitty Signed: 02/23/2021 5:12:11 PM By: Levan Hurst RN, BSN Entered By: Sandre Kitty on 02/22/2021 12:33:35 -------------------------------------------------------------------------------- Wound Assessment Details Patient Name: Date of Service: [name], [name] 02/20/2021 2:30 PM Medical Record Number: [id number] Patient Account Number: [id number] Date of Birth/Sex: Treating RN: 03-06-1976 (45 y.o. [name] Primary Care Voyd Groft: [name] Other Clinician: Referring Kaley Jutras: Treating Kristain Filo/Extender: Mammie Russian, Ivan Anchors in Treatment: 21 Wound Status Wound Number: 6 Primary Abscess Etiology: Wound Location: Right Groin Wound Open Wounding Event: Blister Status: Date Acquired: 02/20/2021 Comorbid Asthma, Chronic Obstructive Pulmonary Disease (COPD), Sleep Weeks Of Treatment: 0 History: Apnea, Congestive Heart Failure, Hypertension, Peripheral Venous Clustered Wound: No Disease, Type II Diabetes, Osteoarthritis, Osteomyelitis, Neuropathy Photos Wound Measurements Length: (cm) 1.5 Width: (cm) 1 Depth: (cm) 7 Area: (cm) 1.178 Volume: (cm) 8.247 % Reduction in Area: 0% % Reduction in Volume: 0% Epithelialization: None Tunneling: No Undermining: No Wound Description Classification: Full Thickness Without Exposed Support Structures Wound Margin: Well defined, not attached Exudate Amount: Large Exudate Type: Serous Exudate  Color: amber Foul Odor After Cleansing: No Slough/Fibrino Yes Wound Bed Granulation Amount: Small (1-33%) Exposed Structure Granulation Quality: Pink Fascia Exposed: No Necrotic Amount: Large (67-100%) Fat Layer (Subcutaneous Tissue) Exposed: Yes Necrotic Quality: Adherent Slough Tendon Exposed: No Muscle Exposed: No Joint Exposed: No Bone Exposed: No Treatment Notes Wound #6 (Groin) Wound Laterality: Right Cleanser Peri-Wound Care Topical Primary Dressing KerraCel Ag Gelling Fiber Dressing, 4x5 in (silver alginate) Discharge Instruction: Apply silver alginate to wound bed as instructed Secondary Dressing Woven Gauze Sponge, Non-Sterile 4x4 in Discharge Instruction: Apply over primary dressing as directed. Secured With Compression Wrap Compression Stockings Environmental education officer) Signed: 02/23/2021 7:47:40 AM By: Sandre Kitty Signed: 02/23/2021 5:12:11 PM By: Levan Hurst RN, BSN Entered By: Sandre Kitty on 02/22/2021 12:33:50 -------------------------------------------------------------------------------- Vitals Details Patient Name: Date of Service: [name], [name] 02/20/2021 2:30 PM Medical Record Number: [id number] Patient Account Number: [id number] Date of Birth/Sex: Treating RN: 1976-11-29 (45 y.o. [name] Primary Care Halvor Behrend: [name] Other Clinician: Referring [name] Ayotte: Treating Maryse Brierley/Extender: Mammie Russian, Ivan Anchors in Treatment: 21 Vital Signs Time Taken: 14:30 Temperature (F): 97.7 Height (in): 62 Pulse (bpm): 92 Weight (lbs): 362 Respiratory  Rate (breaths/min): 16 Body Mass Index (BMI): 66.2 Blood Pressure (mmHg): 101/74 Reference Range: 80 - 120 mg / dl Electronic Signature(s) Signed: 02/23/2021 5:12:11 PM By: Levan Hurst RN, BSN Entered By: Levan Hurst on 02/20/2021 14:48:35

## 2021-02-23 ENCOUNTER — Other Ambulatory Visit: Payer: Self-pay | Admitting: Family Medicine

## 2021-02-23 ENCOUNTER — Other Ambulatory Visit (HOSPITAL_COMMUNITY): Payer: Medicaid Other

## 2021-02-23 DIAGNOSIS — I5033 Acute on chronic diastolic (congestive) heart failure: Secondary | ICD-10-CM

## 2021-02-23 DIAGNOSIS — S31109A Unspecified open wound of abdominal wall, unspecified quadrant without penetration into peritoneal cavity, initial encounter: Secondary | ICD-10-CM | POA: Diagnosis not present

## 2021-02-25 ENCOUNTER — Telehealth: Payer: Self-pay

## 2021-02-25 NOTE — Telephone Encounter (Signed)
ATC to verify Covid test. LVMTCB

## 2021-02-26 LAB — AEROBIC/ANAEROBIC CULTURE W GRAM STAIN (SURGICAL/DEEP WOUND)

## 2021-02-27 ENCOUNTER — Other Ambulatory Visit: Payer: Self-pay

## 2021-02-27 ENCOUNTER — Encounter (HOSPITAL_BASED_OUTPATIENT_CLINIC_OR_DEPARTMENT_OTHER): Payer: Medicaid Other | Admitting: Internal Medicine

## 2021-02-27 DIAGNOSIS — L97218 Non-pressure chronic ulcer of right calf with other specified severity: Secondary | ICD-10-CM | POA: Diagnosis not present

## 2021-02-27 DIAGNOSIS — L97512 Non-pressure chronic ulcer of other part of right foot with fat layer exposed: Secondary | ICD-10-CM | POA: Diagnosis not present

## 2021-02-27 DIAGNOSIS — L02214 Cutaneous abscess of groin: Secondary | ICD-10-CM | POA: Diagnosis not present

## 2021-02-27 DIAGNOSIS — E11621 Type 2 diabetes mellitus with foot ulcer: Secondary | ICD-10-CM | POA: Diagnosis not present

## 2021-02-27 DIAGNOSIS — E1151 Type 2 diabetes mellitus with diabetic peripheral angiopathy without gangrene: Secondary | ICD-10-CM | POA: Diagnosis not present

## 2021-02-27 DIAGNOSIS — Z89512 Acquired absence of left leg below knee: Secondary | ICD-10-CM | POA: Diagnosis not present

## 2021-02-27 DIAGNOSIS — Z89431 Acquired absence of right foot: Secondary | ICD-10-CM | POA: Diagnosis not present

## 2021-02-27 DIAGNOSIS — I5032 Chronic diastolic (congestive) heart failure: Secondary | ICD-10-CM | POA: Diagnosis not present

## 2021-02-27 DIAGNOSIS — I110 Hypertensive heart disease with heart failure: Secondary | ICD-10-CM | POA: Diagnosis not present

## 2021-02-27 DIAGNOSIS — Z6841 Body Mass Index (BMI) 40.0 and over, adult: Secondary | ICD-10-CM | POA: Diagnosis not present

## 2021-02-27 DIAGNOSIS — E11622 Type 2 diabetes mellitus with other skin ulcer: Secondary | ICD-10-CM | POA: Diagnosis not present

## 2021-02-27 DIAGNOSIS — J449 Chronic obstructive pulmonary disease, unspecified: Secondary | ICD-10-CM | POA: Diagnosis not present

## 2021-03-02 NOTE — Progress Notes (Signed)
[name] ([id number]) Visit Report for 02/27/2021 HPI Details Patient Name: Date of Service: [name] [facility] 02/27/2021 3:45 PM Medical Record Number: [id number] Patient Account Number: [id number] Date of Birth/Sex: Treating RN: [birth year] (45 y.o. [name] Primary Care Provider: [name] Other Clinician: Referring Provider: Treating Provider/Extender: Mammie Russian, Ivan Anchors in Treatment: 22 History of Present Illness HPI Description: 05/30/19 patient presents today for initial evaluation our clinic concerning significant wounds that she has in the right gluteal region as well as in the left abdominal area. She also has a pressure injury to her trans metatarsal amputation site on the right which does not appear to be as looking at this time but nonetheless does need to close. This occurred while she was in the Hall. The patient was admitted to the Hall on 05/17/19 discharged on 05/25/19. This was due to the significant abscess in the right gluteal region as well as in the left panus region. She has a history of diabetes which is definitely not under good control of the most useful view of an agency being 17.5. She also has hypertension, COPD, and is morbidly obese. Unfortunately the wound and the right gluteal region extends right up and adjacent to the anus which is going to make the Wound VAC impossible at this point. Nonetheless I think that we're gonna have to basically pack this with wet to dry packing at this time in order to hopefully achieve improvement. Nonetheless this is a significant wound with her diabetes be not under control along with her size and again the nature of the wound I think this is gonna take a very long time to heal. 07/04/2019 upon evaluation today patient actually appears to be doing better in regard to her wounds. Her foot ulcer actually appears to be completely healed which is great the abdominal ulcer has filled in quite  nicely and seems to be doing well although there is a lot of moisture I think that she would benefit from a silver alginate dressing. Nonetheless in regard to the wound on her right gluteal region this showed signs of improvement today still this is a fairly significant ulcer unfortunately. 08/01/2019 on evaluation patient presents for follow-up concerning her wounds over the right gluteal region as well as the left abdominal region. Fortunately both areas are showing signs of improvement which is good news. In fact significant improvement. Spent about a month since have seen her last. Overall I am extremely happy though with the findings of how the wounds have progressed at this point. READMISSION 09/22/2020 This is a now 45 year old woman previously in this clinic for a right gluteal abscess for 3 or 4 visits in 2020. She is a poorly controlled type II diabetic with the last hemoglobin A1c I see in epic at 12.7 in July 2021. She tells [place] that she is here for review of a wound on the tip of her right first metatarsal transmetatarsal amputation. She had an angiogram and angioplasty on 12/20/2018 with angioplasty of her anterior tibial and posterior tibial arteries. She has been followed by Dr. Doran Durand most recent treatment since August I think has been Santyl. She was given a 1 week course of Keflex in mid September. The patient's had an x-ray and also an MRI in August 2021 that did not show osteomyelitis but edema ABI in our clinic was 1.19 Past medical history includes hypertension, hyperlipidemia, COPD, bipolar disorder, poorly controlled type 2 diabetes with PAD. Left BKA, right transmetatarsal amputation, heart failure  with preserved ejection fraction, she is minimally I am ambulatory walking only from her wheelchair to the bathroom with her walker. She only wears a stocking on her foot. Otherwise she is in wheelchair or in bed all day long. 11/1; patient readmitted to the clinic 2 weeks ago. She  has a wound on the tip of the right first metatarsal transmetatarsal amputation site she has been using Santyl. She has Medicaid and we have to apply for assistance if we are going to continue with Santyl although I am hopeful that we will be able to change from the [facility] next week. 11/9; area on the tip of the right first metatarsal amputation site. She has been using Santyl. She was in the Hall overnight for chest pain and they gave her another tube of Santyl which is nice since she has Medicaid and Medicaid would not pay for that. Her wound is smaller 12/2; tip of the right first metatarsal amputation site. She has been using Santyl. No changes wound dimension however I think the surface of the wound is better 12/28; patient has not been here in 3 and half weeks I think transportation issues. We gave her Hydrofera Blue last time however she switched back to [place] last week because somebody told her the wound was larger. The wound is actually measuring smaller and looks healthy. 1/13; since the patient was last here she was actually seen twice in the emergency department most recently on 12/30 this was for a painful small area on the right posterior calf superiorly. Previously she was seen in the ER on 12/19 for this problem but she did not tell us about this the last time she was here. There was some question of this being a spider bite based I think on somebody killing a spider in her apartment. When she was in the ER on 12/30 she complained of increasing pain and swelling. She had previously been given doxycycline but they treated with IV clindamycin transitioning to oral clindamycin. She is still using Hydrofera Blue to the area on the medial transmit amputation on the right this is not changed much today 1/27; 2-week follow-up. The patient has Hydrofera Blue on the medial transmet amputation site. This looks better this week after debridement last week. She has a new area on the right  posterior calf that we looked at last time she has been using Santyl to this that she had leftover from the days where Medicaid paid for this. 2/3; she did not tolerate the Iodoflex well on the back of her leg although the surface of it looks a lot better today. She was approved for Santyl to this area through compassionate release. We are using Hydrofera Blue on the transmet wound on the first met head 2/10; we have been using Santyl on the posterior right leg and the wound on the medial first met head. 02/06/2021 upon evaluation today patient appears to be doing poorly in regard to her posterior leg as well as her foot ulcer. Both are showing signs of erythema and potentially infection at this point as well. There does not appear to be any signs of active infection locally that is a different story. There does not appear to be any signs of overall worsening with regard to the wounds but they are also not doing quite as well as it appears it was previous. I think that the [facility] however is a better dressing probably we can switch to using this on the posterior leg as  well. Collagen may be appropriate at some point but I do not think were quite there yet. 3/18; the patient has a wound on her posterior right leg and the anterior part of her transmet site on the right foot. She was started on an antibiotic 2 weeks ago Levaquin she is still has 3 days above left. We have been using Hydrofera Blue in general the areas look better. HOWEVER she arrives in clinic today with a concern about an abscess in her right groin. Apparently she discovered this recently. Her daughter looked at it last night. 3/25; areas on the transmitted site on the right foot and on the right lower leg both looked better and her measuring smaller. She came in last week with what looked like an abscess site in the dependent part of her abdominal pannus in her groin. I cultured this area that did not specifically grow anything.  This initially had wide tunneling depth although it is seems filled in quite well. Electronic Signature(s) Signed: 03/02/2021 9:50:14 AM By: Linton Ham MD Entered By: Linton Ham on 03/01/2021 09:37:06 -------------------------------------------------------------------------------- Physical Exam Details Patient Name: Date of Service: [name] [facility] 02/27/2021 3:45 PM Medical Record Number: [id number] Patient Account Number: [id number] Date of Birth/Sex: Treating RN: 1975/12/07 (45 y.o. [name] Primary Care Provider: [name] Other Clinician: Referring Provider: Treating Provider/Extender: Mammie Russian, Ivan Anchors in Treatment: 22 Constitutional Sitting or standing Blood Pressure is within target range for patient.. Pulse regular and within target range for patient.Marland Kitchen Respirations regular, non-labored and within target range.. Temperature is normal and within the target range for the patient.Marland Kitchen Appears in no distress. Notes Wound exam The patient's original wounds on the right transmetatarsal site as well as the right posterior calf both looked better. No debridement was required. Especially on the right posterior calf which was initially completely necrotic base of this looks like healthy granulation with surrounding epithelialization New area on the right dependent abdominal pannus in her groin actually appears to have filled in. There is no surrounding infection which is gratifying no tenderness. Electronic Signature(s) Signed: 03/02/2021 9:50:14 AM By: Linton Ham MD Entered By: Linton Ham on 03/01/2021 09:40:25 -------------------------------------------------------------------------------- Physician Orders Details Patient Name: Date of Service: [name], [name] 02/27/2021 3:45 PM Medical Record Number: [id number] Patient Account Number: [id number] Date of Birth/Sex: Treating RN: [birth year] (45 y.o. [name] Primary Care  Provider: [name] Other Clinician: Referring Provider: Treating Provider/Extender: Mammie Russian, Ivan Anchors in Treatment: 22 Verbal / Phone Orders: No Diagnosis Coding Follow-up Appointments Return Appointment in 2 weeks. Bathing/ Shower/ Hygiene May shower with protection but do not get wound dressing(s) wet. - use a cast protector when not changing the dressing. May shower and wash wound with soap and water. - with dressing changes only. Edema Control - Lymphedema / SCD / Other Right Lower Extremity Elevate legs to the level of the heart or above for 30 minutes daily and/or when sitting, a frequency of: - 3-4 times throughout the day. Avoid standing for long periods of time. Exercise regularly - may wear diabetic shoes now, may restart physical therapy, may start walking. Just ensure the filler in the diabetic shoe does not rub on the wound area. Moisturize legs daily. Off-Loading Other: - ensure no pressure to foot wound. Wound Treatment Wound #4 - Amputation Site - Transmetatarsal Wound Laterality: Right Cleanser: Soap and Water Every Other Day/30 Days Discharge Instructions: May shower and wash wound with dial antibacterial soap and water prior  to dressing change. Cleanser: Normal Saline (Generic) Every Other Day/30 Days Discharge Instructions: Cleanse the wound with Normal Saline prior to applying a clean dressing using gauze sponges, not tissue or cotton balls. Prim Dressing: Hydrofera Blue Ready Foam, 4x5 in Every Other Day/30 Days ary Discharge Instructions: Apply to wound bed as instructed. moisten with saline. Secondary Dressing: Woven Gauze Sponge, Non-Sterile 4x4 in (Generic) Every Other Day/30 Days Discharge Instructions: Apply over primary dressing as directed. Secured With: [employer], 4.5x3.1 (in/yd) (Generic) Every Other Day/30 Days Discharge Instructions: Secure with Kerlix as directed. Secured With: Paper Tape, 2x10 (in/yd)  (Generic) Every Other Day/30 Days Discharge Instructions: Secure dressing with tape as directed. Wound #5 - Lower Leg Wound Laterality: Right, Posterior Cleanser: Soap and Water 1 x Per Day/30 Days Discharge Instructions: May shower and wash wound with dial antibacterial soap and water prior to dressing change. Cleanser: Wound Cleanser 1 x Per Day/30 Days Discharge Instructions: Cleanse the wound with wound cleanser prior to applying a clean dressing using gauze sponges, not tissue or cotton balls. Prim Dressing: Hydrofera Blue Classic Foam, 4x4 in 1 x Per Day/30 Days ary Discharge Instructions: Moisten with saline prior to applying to wound bed Secondary Dressing: Woven Gauze Sponge, Non-Sterile 4x4 in 1 x Per Day/30 Days Discharge Instructions: Apply over primary dressing as directed. Secured With: 643M Medipore H Soft Cloth Surgical T 4 x 2 (in/yd) 1 x Per Day/30 Days ape Discharge Instructions: Secure dressing with tape as directed. Wound #6 - Groin Wound Laterality: Right Cleanser: Soap and Water Every Other Day/15 Days Discharge Instructions: May shower and wash wound with dial antibacterial soap and water prior to dressing change. Prim Dressing: KerraCel Ag Gelling Fiber Dressing, 4x5 in (silver alginate) Every Other Day/15 Days ary Discharge Instructions: Apply silver alginate to wound bed as instructed Secondary Dressing: Woven Gauze Sponge, Non-Sterile 4x4 in Every Other Day/15 Days Discharge Instructions: Apply over primary dressing as directed. Secondary Dressing: ABD Pad, 5x9 Every Other Day/15 Days Discharge Instructions: Apply over primary dressing as directed. Secured With: 643M Medipore H Soft Cloth Surgical T 4 x 2 (in/yd) Every Other Day/15 Days ape Discharge Instructions: Secure dressing with tape as directed. Electronic Signature(s) Signed: 02/27/2021 6:16:52 PM By: Lorrin Jackson Signed: 03/02/2021 9:50:14 AM By: Linton Ham MD Entered By: Lorrin Jackson on  02/27/2021 17:24:43 -------------------------------------------------------------------------------- Problem List Details Patient Name: Date of Service: [name], [name] [facility] 02/27/2021 3:45 PM Medical Record Number: [id number] Patient Account Number: [id number] Date of Birth/Sex: Treating RN: Jul 20, 1976 (45 y.o. [name] Primary Care Provider: [name] Other Clinician: Referring Provider: Treating Provider/Extender: Mammie Russian, Ivan Anchors in Treatment: 59 Active Problems ICD-10 Encounter Code Description Active Date MDM Diagnosis E11.621 Type 2 diabetes mellitus with foot ulcer 09/22/2020 No Yes L97.512 Non-pressure chronic ulcer of other part of right foot with fat layer exposed 09/22/2020 No Yes Z89.512 Acquired absence of left leg below knee 09/22/2020 No Yes E11.51 Type 2 diabetes mellitus with diabetic peripheral angiopathy without gangrene 09/22/2020 No Yes L97.218 Non-pressure chronic ulcer of right calf with other specified severity 12/18/2020 No Yes L02.214 Cutaneous abscess of groin 02/20/2021 No Yes Inactive Problems Resolved Problems Electronic Signature(s) Signed: 03/02/2021 9:50:14 AM By: Linton Ham MD Entered By: Linton Ham on 03/01/2021 09:34:46 -------------------------------------------------------------------------------- Progress Note Details Patient Name: Date of Service: [name], [name] 02/27/2021 3:45 PM Medical Record Number: [id number] Patient Account Number: [id number] Date of Birth/Sex: Treating RN: 12/26/1975 (45 y.o. [name] Primary Care Provider: [name],  [place] Other Clinician: Referring Provider: Treating Provider/Extender: Mammie Russian, Ivan Anchors in Treatment: 22 Subjective History of Present Illness (HPI) 05/30/19 patient presents today for initial evaluation our clinic concerning significant wounds that she has in the right gluteal region as well as in the left abdominal area.  She also has a pressure injury to her trans metatarsal amputation site on the right which does not appear to be as looking at this time but nonetheless does need to close. This occurred while she was in the Hall. The patient was admitted to the Hall on 05/17/19 discharged on 05/25/19. This was due to the significant abscess in the right gluteal region as well as in the left panus region. She has a history of diabetes which is definitely not under good control of the most useful view of an agency being 17.5. She also has hypertension, COPD, and is morbidly obese. Unfortunately the wound and the right gluteal region extends right up and adjacent to the anus which is going to make the Wound VAC impossible at this point. Nonetheless I think that we're gonna have to basically pack this with wet to dry packing at this time in order to hopefully achieve improvement. Nonetheless this is a significant wound with her diabetes be not under control along with her size and again the nature of the wound I think this is gonna take a very long time to heal. 07/04/2019 upon evaluation today patient actually appears to be doing better in regard to her wounds. Her foot ulcer actually appears to be completely healed which is great the abdominal ulcer has filled in quite nicely and seems to be doing well although there is a lot of moisture I think that she would benefit from a silver alginate dressing. Nonetheless in regard to the wound on her right gluteal region this showed signs of improvement today still this is a fairly significant ulcer unfortunately. 08/01/2019 on evaluation patient presents for follow-up concerning her wounds over the right gluteal region as well as the left abdominal region. Fortunately both areas are showing signs of improvement which is good news. In fact significant improvement. Spent about a month since have seen her last. Overall I am extremely happy though with the findings of how the  wounds have progressed at this point. READMISSION 09/22/2020 This is a now 45 year old woman previously in this clinic for a right gluteal abscess for 3 or 4 visits in 2020. She is a poorly controlled type II diabetic with the last hemoglobin A1c I see in epic at 12.7 in July 2021. She tells [place] that she is here for review of a wound on the tip of her right first metatarsal transmetatarsal amputation. She had an angiogram and angioplasty on 12/20/2018 with angioplasty of her anterior tibial and posterior tibial arteries. She has been followed by Dr. Doran Durand most recent treatment since August I think has been Santyl. She was given a 1 week course of Keflex in mid September. The patient's had an x-ray and also an MRI in August 2021 that did not show osteomyelitis but edema ABI in our clinic was 1.19 Past medical history includes hypertension, hyperlipidemia, COPD, bipolar disorder, poorly controlled type 2 diabetes with PAD. Left BKA, right transmetatarsal amputation, heart failure with preserved ejection fraction, she is minimally I am ambulatory walking only from her wheelchair to the bathroom with her walker. She only wears a stocking on her foot. Otherwise she is in wheelchair or in bed all day long. 11/1; patient readmitted  to the clinic 2 weeks ago. She has a wound on the tip of the right first metatarsal transmetatarsal amputation site she has been using Santyl. She has Medicaid and we have to apply for assistance if we are going to continue with Santyl although I am hopeful that we will be able to change from the [place] next week. 11/9; area on the tip of the right first metatarsal amputation site. She has been using Santyl. She was in the [facility] overnight for chest pain and they gave her another tube of Santyl which is nice since she has Medicaid and Medicaid would not pay for that. Her wound is smaller 12/2; tip of the right first metatarsal amputation site. She has been using Santyl. No  changes wound dimension however I think the surface of the wound is better 12/28; patient has not been here in 3 and half weeks I think transportation issues. We gave her Hydrofera Blue last time however she switched back to [place] last week because somebody told her the wound was larger. The wound is actually measuring smaller and looks healthy. 1/13; since the patient was last here she was actually seen twice in the emergency department most recently on 12/30 this was for a painful small area on the right posterior calf superiorly. Previously she was seen in the ER on 12/19 for this problem but she did not tell us about this the last time she was here. There was some question of this being a spider bite based I think on somebody killing a spider in her apartment. When she was in the ER on 12/30 she complained of increasing pain and swelling. She had previously been given doxycycline but they treated with IV clindamycin transitioning to oral clindamycin. She is still using Hydrofera Blue to the area on the medial transmit amputation on the right this is not changed much today 1/27; 2-week follow-up. The patient has Hydrofera Blue on the medial transmet amputation site. This looks better this week after debridement last week. She has a new area on the right posterior calf that we looked at last time she has been using Santyl to this that she had leftover from the days where Medicaid paid for this. 2/3; she did not tolerate the Iodoflex well on the back of her leg although the surface of it looks a lot better today. She was approved for Santyl to this area through compassionate release. We are using Hydrofera Blue on the transmet wound on the first met head 2/10; we have been using Santyl on the posterior right leg and the wound on the medial first met head. 02/06/2021 upon evaluation today patient appears to be doing poorly in regard to her posterior leg as well as her foot ulcer. Both are showing signs  of erythema and potentially infection at this point as well. There does not appear to be any signs of active infection locally that is a different story. There does not appear to be any signs of overall worsening with regard to the wounds but they are also not doing quite as well as it appears it was previous. I think that the [facility] however is a better dressing probably we can switch to using this on the posterior leg as well. Collagen may be appropriate at some point but I do not think were quite there yet. 3/18; the patient has a wound on her posterior right leg and the anterior part of her transmet site on the right foot. She was started  on an antibiotic 2 weeks ago Levaquin she is still has 3 days above left. We have been using Hydrofera Blue in general the areas look better. HOWEVER she arrives in clinic today with a concern about an abscess in her right groin. Apparently she discovered this recently. Her daughter looked at it last night. 3/25; areas on the transmitted site on the right foot and on the right lower leg both looked better and her measuring smaller. She came in last week with what looked like an abscess site in the dependent part of her abdominal pannus in her groin. I cultured this area that did not specifically grow anything. This initially had wide tunneling depth although it is seems filled in quite well. Objective Constitutional Sitting or standing Blood Pressure is within target range for patient.. Pulse regular and within target range for patient.Marland Kitchen Respirations regular, non-labored and within target range.. Temperature is normal and within the target range for the patient.Marland Kitchen Appears in no distress. Vitals Time Taken: 4:27 PM, Height: 62 in, Source: Stated, Weight: 362 lbs, Source: Stated, BMI: 66.2, Temperature: 98.1 F, Pulse: 93 bpm, Respiratory Rate: 18 breaths/min, Blood Pressure: 146/93 mmHg. General Notes: glucose over 400 per pt's meter at present General  Notes: Wound exam ooThe patient's original wounds on the right transmetatarsal site as well as the right posterior calf both looked better. No debridement was required. Especially on the right posterior calf which was initially completely necrotic base of this looks like healthy granulation with surrounding epithelialization ooNew area on the right dependent abdominal pannus in her groin actually appears to have filled in. There is no surrounding infection which is gratifying no tenderness. Integumentary (Hair, Skin) Wound #4 status is Open. Original cause of wound was Gradually Appeared. The date acquired was: 03/06/2020. The wound has been in treatment 22 weeks. 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 The wound measures 0cm length x 0cm width x 0cm depth; 0cm^2 area and 0cm^3 volume. There is no tunneling or undermining noted. There is a none present amount of drainage noted. There is no granulation within the wound bed. There is no necrotic tissue within the wound bed. Wound #5 status is Open. Original cause of wound was Not Known. The date acquired was: 11/05/2020. The wound has been in treatment 10 weeks. The wound is located on the Right,Posterior Lower Leg. The wound measures 1cm length x 1.3cm width x 0.1cm depth; 1.021cm^2 area and 0.102cm^3 volume. There is Fat Layer (Subcutaneous Tissue) exposed. There is no tunneling or undermining noted. There is a small amount of serosanguineous drainage noted. The wound margin is flat and intact. There is large (67-100%) red granulation within the wound bed. There is no necrotic tissue within the wound bed. Wound #6 status is Open. Original cause of wound was Blister. The date acquired was: 02/20/2021. The wound has been in treatment 1 weeks. The wound is located on the Right Groin. The wound measures 0.6cm length x 0.9cm width x 0.8cm depth; 0.424cm^2 area and 0.339cm^3 volume. There is Fat Layer (Subcutaneous Tissue)  exposed. There is no tunneling noted, however, there is undermining starting at 12:00 and ending at 12:00 with a maximum distance of 3.6cm. There is a medium amount of serosanguineous drainage noted. The wound margin is well defined and not attached to the wound base. There is large (67- 100%) red granulation within the wound bed. There is no necrotic tissue within the wound bed. 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 Bathing/ Shower/ Hygiene: May shower with protection but do not get wound dressing(s) wet. - use a cast protector when not changing the dressing. May shower and wash wound with soap and water. - with dressing changes only. Edema Control - Lymphedema / SCD / Other: Elevate legs to the level of the heart or above for 30 minutes daily and/or when sitting, a frequency of: - 3-4 times throughout the day. Avoid standing for long periods of time. Exercise regularly - may wear diabetic shoes now, may restart physical therapy, may start walking. Just ensure the filler in the diabetic shoe does not rub on the wound area. Moisturize legs daily. Off-Loading: Other: - ensure no pressure to foot wound. WOUND #4: - Amputation Site - Transmetatarsal Wound Laterality: Right Cleanser: Soap and Water Every Other Day/30 Days Discharge Instructions: May shower and wash wound with dial antibacterial soap and water prior to dressing change. Cleanser: Normal Saline (Generic) Every Other Day/30 Days Discharge Instructions: Cleanse the wound with Normal Saline prior to applying a clean dressing using gauze sponges,  not tissue or cotton balls. Prim Dressing: Hydrofera Blue Ready Foam, 4x5 in Every Other Day/30 Days ary Discharge Instructions: Apply to wound bed as instructed. moisten with saline. Secondary Dressing: Woven Gauze Sponge, Non-Sterile 4x4 in (Generic) Every Other Day/30 Days Discharge Instructions: Apply over primary dressing as directed. Secured With: [employer], 4.5x3.1 (in/yd) (Generic) Every Other Day/30 Days Discharge Instructions: Secure with Kerlix as directed. Secured With: Paper T ape, 2x10 (in/yd) (Generic) Every Other Day/30 Days Discharge Instructions: Secure dressing with tape as directed. WOUND #5: - Lower Leg Wound Laterality: Right, Posterior Cleanser: Soap and Water 1 x Per Day/30 Days Discharge Instructions: May shower and wash wound with dial antibacterial soap and water prior to dressing change. Cleanser: Wound Cleanser 1 x Per Day/30 Days Discharge Instructions: Cleanse the wound with wound cleanser prior to applying a clean dressing using gauze sponges, not tissue or cotton balls. Prim Dressing: Hydrofera Blue Classic Foam, 4x4 in 1 x Per Day/30 Days ary Discharge Instructions: Moisten with saline prior to applying to wound bed Secondary Dressing: Woven Gauze Sponge, Non-Sterile 4x4 in 1 x Per Day/30 Days Discharge Instructions: Apply over primary dressing as directed. Secured With: 74M Medipore H Soft Cloth Surgical T 4 x 2 (in/yd) 1 x Per Day/30 Days ape Discharge Instructions: Secure dressing with tape as directed. WOUND #6: - Groin Wound Laterality: Right Cleanser: Soap and Water Every Other Day/15 Days Discharge Instructions: May shower and wash wound with dial antibacterial soap and water prior to dressing change. Prim Dressing: KerraCel Ag Gelling Fiber Dressing, 4x5 in (silver alginate) Every Other Day/15 Days ary Discharge Instructions: Apply silver alginate to wound bed as instructed Secondary Dressing: Woven Gauze Sponge, Non-Sterile 4x4 in Every  Other Day/15 Days Discharge Instructions: Apply over primary dressing as directed. Secondary Dressing: ABD Pad, 5x9 Every Other Day/15 Days Discharge Instructions: Apply over primary dressing as directed. Secured With: 74M Medipore H Soft Cloth Surgical T 4 x 2 (in/yd) Every Other Day/15 Days ape Discharge Instructions: Secure dressing with tape as directed. #1I've continued with Hydrofera Blue on the transmitted site and the right posterior calf. #  2 surprisingly the area on her pannus fold did not culture anything specific. I gave her a course of antibiotics I'm not going to renew this this is filled in quite nicely. I'm left with the idea that this may have been a abscess with a non-cultural bowl organism. 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 If that's true going forward I'm not sure how we are going to prevent this. #3 no additional antibiotics. 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 As we get closer to the area top of her Trans-met site closing, I'll need to discuss what she has footwear. She may require some form of possible tissue Electronic Signature(s) Signed: 03/02/2021 9:50:14 AM By: Linton Ham MD Entered By: Linton Ham on 03/01/2021 09:43:45 -------------------------------------------------------------------------------- SuperBill Details Patient Name: Date of Service: [name], [name], [name] 02/27/2021 Medical Record Number: [id number] Patient Account Number: [id number] Date of Birth/Sex: Treating RN: 1976-09-28 (45 y.o. [name] Primary Care Provider: [name] Other Clinician: Referring Provider: Treating Provider/Extender: Mammie Russian, Ivan Anchors in Treatment: 22 Diagnosis Coding ICD-10 Codes Code Description E11.621 Type 2 diabetes mellitus with foot ulcer L97.512 Non-pressure chronic ulcer of other part  of right foot with fat layer exposed Z89.512 Acquired absence of left leg below knee E11.51 Type 2 diabetes mellitus with diabetic peripheral angiopathy without gangrene L97.218 Non-pressure chronic ulcer of right calf with other specified severity L02.214 Cutaneous abscess of groin Facility Procedures CPT4 Code: [id number] Description: 9214 - WOUND CARE VISIT-LEV 4 EST PT Modifier: Quantity: 1 Physician Procedures : CPT4 Code Description Modifier [id number] - WC PHYS LEVEL 4 - EST PT ICD-10 Diagnosis Description L97.512 Non-pressure chronic ulcer of other part of right foot with fat layer exposed L97.218 Non-pressure chronic ulcer of right calf with other  specified severity L02.214 Cutaneous abscess of groin E11.621 Type 2 diabetes mellitus with foot ulcer Quantity: 1 Electronic Signature(s) Signed: 03/02/2021 9:50:14 AM By: Linton Ham MD Previous Signature: 02/27/2021 6:16:52 PM Version By: Lorrin Jackson Entered By: Linton Ham on 03/01/2021 09:44:28

## 2021-03-04 ENCOUNTER — Ambulatory Visit: Payer: Medicaid Other | Admitting: Internal Medicine

## 2021-03-04 ENCOUNTER — Telehealth: Payer: Self-pay

## 2021-03-04 DIAGNOSIS — Z7409 Other reduced mobility: Secondary | ICD-10-CM

## 2021-03-04 IMAGING — CR DG CHEST 2V
2 series · 2 of 2 positions shown · non-contrast
Comparison: 01/01/2020

CLINICAL DATA: Chest pain.

EXAM:
CHEST - 2 VIEW

[chest lat]
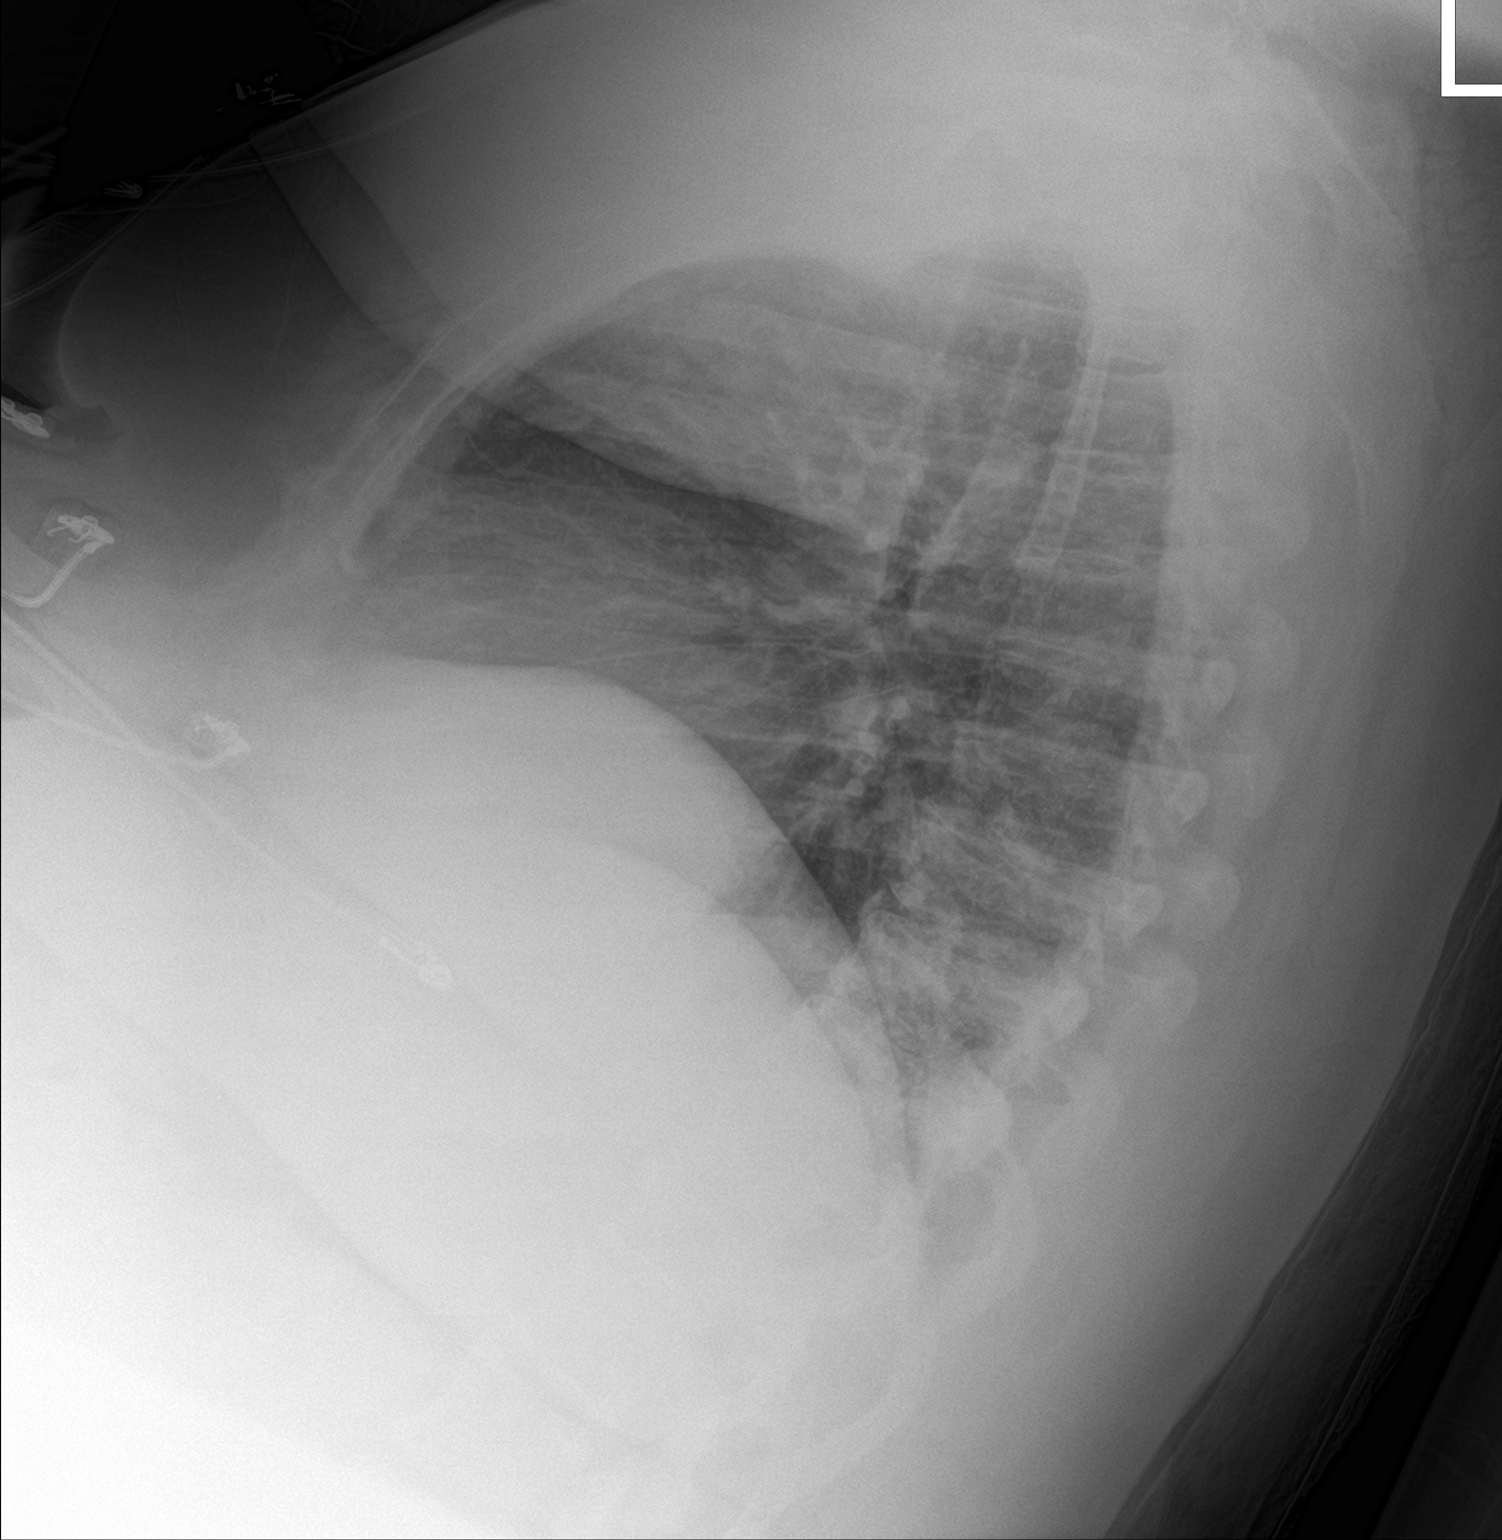

[chest ap]
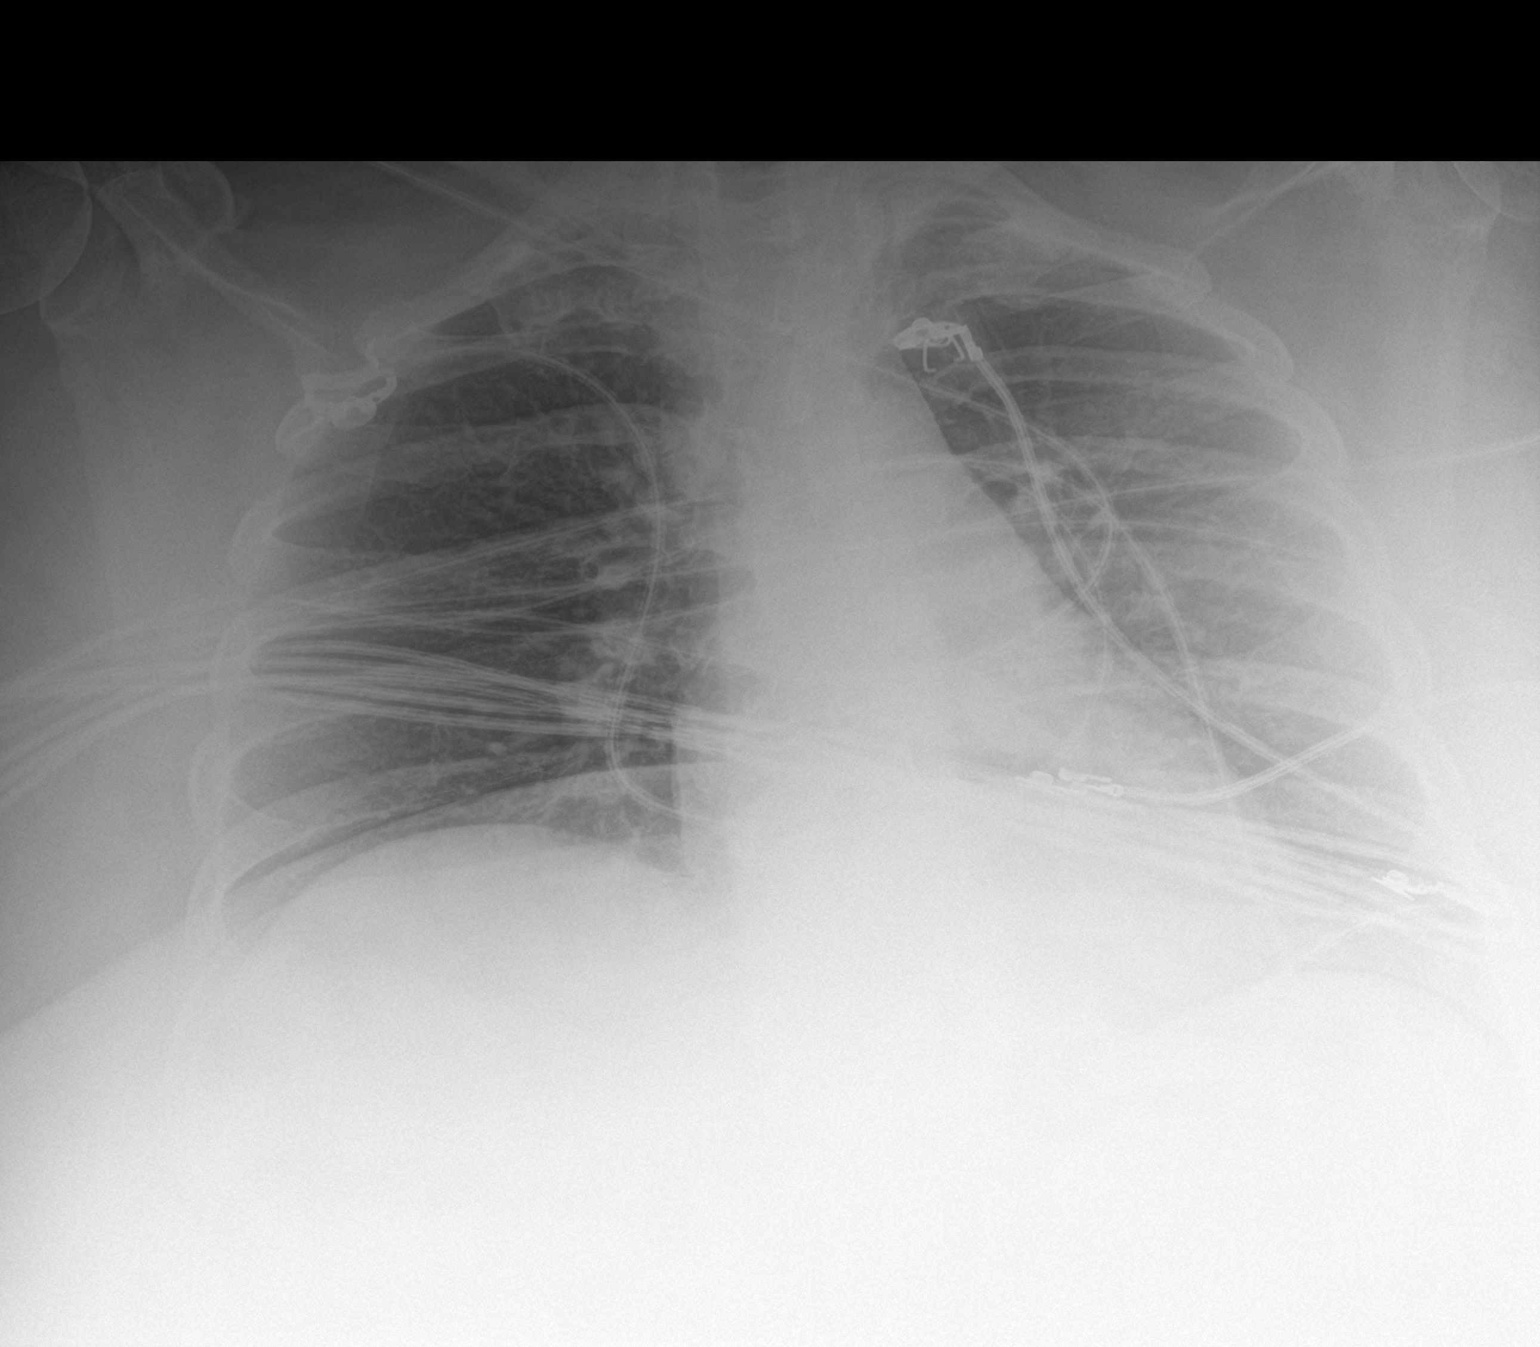

[2 of 2 positions shown; findings below may reference images not displayed]

FINDINGS: Evaluation is limited by the patient's body habitus. There is no
definite acute cardiopulmonary process. The lung volumes are
somewhat low. There is no large focal infiltrate or pleural
effusion. The heart size is stable from prior study.
IMPRESSION: No active cardiopulmonary disease.

## 2021-03-04 NOTE — Progress Notes (Signed)
[name] ([id number]) Visit Report for 02/27/2021 Arrival Information Details Patient Name: Date of Service: [name] [facility] 02/27/2021 3:45 PM Medical Record Number: [id number] Patient Account Number: [id number] Date of Birth/Sex: Treating RN: [birth year] (45 y.o. [name] Primary Care [name]: [name] Other Clinician: Referring [name]: Treating [name]/Extender: [name], [name] in Treatment: 39 Visit Information History Since Last Visit Added or deleted any medications: Yes Patient Arrived: Wheel Chair Any new allergies or adverse reactions: No Arrival Time: 16:25 Had a fall or experienced change in No Accompanied By: daughter activities of daily living that may affect Transfer Assistance: None risk of falls: Patient Identification Verified: Yes Signs or symptoms of abuse/neglect since last visito No Secondary Verification Process Completed: Yes Hospitalized since last visit: No Patient Requires Transmission-Based Precautions: No Implantable device outside of the clinic excluding No Patient Has Alerts: No cellular tissue based products placed in the center since last visit: Has Dressing in Place as Prescribed: Yes Pain Present Now: No Electronic Signature(s) Signed: 02/27/2021 6:33:47 PM By: Baruch Gouty RN, BSN Entered By: Baruch Gouty on 02/27/2021 16:26:32 -------------------------------------------------------------------------------- Clinic Level of Care Assessment Details Patient Name: Date of Service: [name] [facility] 02/27/2021 3:45 PM Medical Record Number: [id number] Patient Account Number: [id number] Date of Birth/Sex: Treating RN: [birth year] (45 y.o. [name] Primary Care Amberleigh Gerken: [name] Other Clinician: Referring [name]: Treating Maninder Deboer/Extender: [name], [name] in Treatment: 22 Clinic Level of Care Assessment Items TOOL 4 Quantity Score X- 1 0 Use when  only an EandM is performed on FOLLOW-UP visit ASSESSMENTS - Nursing Assessment / Reassessment X- 1 10 Reassessment of Co-morbidities (includes updates in patient status) X- 1 5 Reassessment of Adherence to Treatment Plan ASSESSMENTS - Wound and Skin A ssessment / Reassessment '[]'  - 0 Simple Wound Assessment / Reassessment - one wound X- 3 5 Complex Wound Assessment / Reassessment - multiple wounds '[]'  - 0 Dermatologic / Skin Assessment (not related to wound area) ASSESSMENTS - Focused Assessment '[]'  - 0 Circumferential Edema Measurements - multi extremities '[]'  - 0 Nutritional Assessment / Counseling / Intervention '[]'  - 0 Lower Extremity Assessment (monofilament, tuning fork, pulses) '[]'  - 0 Peripheral Arterial Disease Assessment (using hand held doppler) ASSESSMENTS - Ostomy and/or Continence Assessment and Care '[]'  - 0 Incontinence Assessment and Management '[]'  - 0 Ostomy Care Assessment and Management (repouching, etc.) PROCESS - Coordination of Care '[]'  - 0 Simple Patient / Family Education for ongoing care X- 1 20 Complex (extensive) Patient / Family Education for ongoing care '[]'  - 0 Staff obtains Programmer, systems, Records, T Results / Process Orders est '[]'  - 0 Staff telephones HHA, Nursing Homes / Clarify orders / etc '[]'  - 0 Routine Transfer to another Facility (non-emergent condition) '[]'  - 0 Routine Hall Admission (non-emergent condition) '[]'  - 0 New Admissions / Biomedical engineer / Ordering NPWT Apligraf, etc. , '[]'  - 0 Emergency Hall Admission (emergent condition) '[]'  - 0 Simple Discharge Coordination '[]'  - 0 Complex (extensive) Discharge Coordination PROCESS - Special Needs '[]'  - 0 Pediatric / Minor Patient Management '[]'  - 0 Isolation Patient Management '[]'  - 0 Hearing / Language / Visual special needs '[]'  - 0 Assessment of Community assistance (transportation, D/C planning, etc.) '[]'  - 0 Additional assistance / Altered mentation '[]'  - 0 Support  Surface(s) Assessment (bed, cushion, seat, etc.) INTERVENTIONS - Wound Cleansing / Measurement '[]'  - 0 Simple Wound Cleansing - one wound X- 3 5 Complex Wound Cleansing - multiple wounds '[]'  - 0  Wound Imaging (photographs - any number of wounds) X- 1 5 Wound Tracing (instead of photographs) '[]'  - 0 Simple Wound Measurement - one wound X- 3 5 Complex Wound Measurement - multiple wounds INTERVENTIONS - Wound Dressings X - Small Wound Dressing one or multiple wounds 3 10 '[]'  - 0 Medium Wound Dressing one or multiple wounds '[]'  - 0 Large Wound Dressing one or multiple wounds X- 1 5 Application of Medications - topical '[]'  - 0 Application of Medications - injection INTERVENTIONS - Miscellaneous '[]'  - 0 External ear exam '[]'  - 0 Specimen Collection (cultures, biopsies, blood, body fluids, etc.) '[]'  - 0 Specimen(s) / Culture(s) sent or taken to Lab for analysis '[]'  - 0 Patient Transfer (multiple staff / Civil Service fast streamer / Similar devices) '[]'  - 0 Simple Staple / Suture removal (25 or less) '[]'  - 0 Complex Staple / Suture removal (26 or more) '[]'  - 0 Hypo / Hyperglycemic Management (close monitor of Blood Glucose) '[]'  - 0 Ankle / Brachial Index (ABI) - do not check if billed separately X- 1 5 Vital Signs Has the patient been seen at the Hall within the last three years: Yes Total Score: 125 Level Of Care: New/Established - Level 4 Electronic Signature(s) Signed: 02/27/2021 6:16:52 PM By: Lorrin Jackson Entered By: Lorrin Jackson on 02/27/2021 17:21:48 -------------------------------------------------------------------------------- Encounter Discharge Information Details Patient Name: Date of Service: [name], [name] 02/27/2021 3:45 PM Medical Record Number: [id number] Patient Account Number: [id number] Date of Birth/Sex: Treating RN: 1976/04/12 (45 y.o. [name] Primary Care Nedda Gains: [name] Other Clinician: Referring [name]: Treating [name]/Extender: [name], [name] in Treatment: 22 Encounter Discharge Information Items Discharge Condition: Stable Ambulatory Status: Wheelchair Discharge Destination: Home Transportation: Other Accompanied By: daughter Schedule Follow-up Appointment: Yes Clinical Summary of Care: Patient Declined Notes transportation service Electronic Signature(s) Signed: 02/27/2021 6:33:47 PM By: Baruch Gouty RN, BSN Entered By: Baruch Gouty on 02/27/2021 18:09:52 -------------------------------------------------------------------------------- Lower Extremity Assessment Details Patient Name: Date of Service: [name] [facility] 02/27/2021 3:45 PM Medical Record Number: [id number] Patient Account Number: [id number] Date of Birth/Sex: Treating RN: 16-Dec-1975 (45 y.o. [name] Primary Care Dmarion Perfect: [name] Other Clinician: Referring Jaydon Avina: Treating Afsana Liera/Extender: [name], [name] in Treatment: 22 Edema Assessment Assessed: [Left: No] [Right: No] Edema: [Left: Ye] [Right: s] Calf Left: Right: Point of Measurement: From Medial Instep 45.5 cm Ankle Left: Right: Point of Measurement: From Medial Instep 24.5 cm Vascular Assessment Pulses: Dorsalis Pedis Palpable: [Right:No] Electronic Signature(s) Signed: 02/27/2021 6:33:47 PM By: Baruch Gouty RN, BSN Entered By: Baruch Gouty on 02/27/2021 16:32:17 -------------------------------------------------------------------------------- Multi Wound Chart Details Patient Name: Date of Service: [name], Belinda [facility] 02/27/2021 3:45 PM Medical Record Number: [id number] Patient Account Number: [id number] Date of Birth/Sex: Treating RN: [birth year] (45 y.o. [name] Primary Care Steven Veazie: [name] Other Clinician: Referring Talen Poser: Treating Stewart Sasaki/Extender: [name], [name] in Treatment: 22 Vital Signs Height(in): 71 Pulse(bpm): 38 Weight(lbs):  616 Blood Pressure(mmHg): 146/93 Body Mass Index(BMI): 66 Temperature(F): 98.1 Respiratory Rate(breaths/min): 18 Photos: Right Amputation Site - Right, Posterior Lower Leg Right Groin Wound Location: Transmetatarsal Gradually Appeared Not Known Blister Wounding Event: Diabetic Wound/Ulcer of the Lower Diabetic Wound/Ulcer of the Lower Abscess Primary Etiology: Extremity Extremity Asthma, Chronic Obstructive Asthma, Chronic Obstructive Asthma, Chronic Obstructive Comorbid History: Pulmonary Disease (COPD), Sleep Pulmonary Disease (COPD), Sleep Pulmonary Disease (COPD), Sleep Apnea, Congestive Heart Failure, Apnea, Congestive Heart Failure, Apnea, Congestive Heart Failure, Hypertension, Peripheral Venous Hypertension, Peripheral Venous Hypertension, Peripheral Venous  Disease, Type II Diabetes, Disease, Type II Diabetes, Disease, Type II Diabetes, Osteoarthritis, Osteomyelitis, Osteoarthritis, Osteomyelitis, Osteoarthritis, Osteomyelitis, Neuropathy Neuropathy Neuropathy 03/06/2020 11/05/2020 02/20/2021 Date Acquired: '22 10 1 ' Weeks of Treatment: Open Open Open Wound Status: 0x0x0 1x1.3x0.1 0.6x0.9x0.8 Measurements L x W x D (cm) 0 1.021 0.424 A (cm) : rea 0 0.102 0.339 Volume (cm) : 100.00% 54.90% 64.00% % Reduction in A rea: 100.00% 54.90% 95.90% % Reduction in Volume: 12 Starting Position 1 (o'clock): 12 Ending Position 1 (o'clock): 3.6 Maximum Distance 1 (cm): No No Yes Undermining: Grade 2 Grade 2 Full Thickness Without Exposed Classification: Support Structures None Present Small Medium Exudate Amount: N/A Serosanguineous Serosanguineous Exudate Type: N/A red, brown red, brown Exudate Color: N/A Flat and Intact Well defined, not attached Wound Margin: None Present (0%) Large (67-100%) Large (67-100%) Granulation Amount: N/A Red Red Granulation Quality: None Present (0%) None Present (0%) None Present (0%) Necrotic Amount: Fascia: No Fat Layer  (Subcutaneous Tissue): Yes Fat Layer (Subcutaneous Tissue): Yes Exposed Structures: Fat Layer (Subcutaneous Tissue): No Fascia: No Fascia: No Tendon: No Tendon: No Tendon: No Muscle: No Muscle: No Muscle: No Joint: No Joint: No Joint: No Bone: No Bone: No Bone: No Large (67-100%) Small (1-33%) None Epithelialization: Treatment Notes Wound #4 (Amputation Site - Transmetatarsal) Wound Laterality: Right Cleanser Soap and Water Discharge Instruction: May shower and wash wound with dial antibacterial soap and water prior to dressing change. Normal Saline Discharge Instruction: Cleanse the wound with Normal Saline prior to applying a clean dressing using gauze sponges, not tissue or cotton balls. Peri-Wound Care Topical Primary Dressing Hydrofera Blue Ready Foam, 4x5 in Discharge Instruction: Apply to wound bed as instructed. moisten with saline. Secondary Dressing Woven Gauze Sponge, Non-Sterile 4x4 in Discharge Instruction: Apply over primary dressing as directed. Secured With [employer], 4.5x3.1 (in/yd) Discharge Instruction: Secure with Kerlix as directed. Paper Tape, 2x10 (in/yd) Discharge Instruction: Secure dressing with tape as directed. Compression Wrap Compression Stockings Add-Ons Wound #5 (Lower Leg) Wound Laterality: Right, Posterior Cleanser Soap and Water Discharge Instruction: May shower and wash wound with dial antibacterial soap and water prior to dressing change. Wound Cleanser Discharge Instruction: Cleanse the wound with wound cleanser prior to applying a clean dressing using gauze sponges, not tissue or cotton balls. Peri-Wound Care Topical Primary Dressing Hydrofera Blue Classic Foam, 4x4 in Discharge Instruction: Moisten with saline prior to applying to wound bed Secondary Dressing Woven Gauze Sponge, Non-Sterile 4x4 in Discharge Instruction: Apply over primary dressing as directed. Secured With 72M Medipore H Soft Cloth Surgical T 4  x 2 (in/yd) ape Discharge Instruction: Secure dressing with tape as directed. Compression Wrap Compression Stockings Add-Ons Wound #6 (Groin) Wound Laterality: Right Cleanser Soap and Water Discharge Instruction: May shower and wash wound with dial antibacterial soap and water prior to dressing change. Peri-Wound Care Topical Primary Dressing KerraCel Ag Gelling Fiber Dressing, 4x5 in (silver alginate) Discharge Instruction: Apply silver alginate to wound bed as instructed Secondary Dressing Woven Gauze Sponge, Non-Sterile 4x4 in Discharge Instruction: Apply over primary dressing as directed. ABD Pad, 5x9 Discharge Instruction: Apply over primary dressing as directed. Secured With 72M Medipore H Soft Cloth Surgical T 4 x 2 (in/yd) ape Discharge Instruction: Secure dressing with tape as directed. Compression Wrap Compression Stockings Add-Ons Electronic Signature(s) Signed: 03/02/2021 9:50:14 AM By: Linton Ham MD Signed: 03/02/2021 5:20:59 PM By: Lorrin Jackson Entered By: Linton Ham on 03/01/2021 09:35:13 -------------------------------------------------------------------------------- Multi-Disciplinary Care Plan Details Patient Name: Date of Service: [name], Belinda [facility] 02/27/2021  3:45 PM Medical Record Number: [id number] Patient Account Number: [id number] Date of Birth/Sex: Treating RN: 09/09/1976 (45 y.o. [name] Primary Care Elleigh Cassetta: [name] Other Clinician: Referring Mohd. Derflinger: Treating Ailish Prospero/Extender: [name], [name] in Treatment: 22 Multidisciplinary Care Plan reviewed with physician Active Inactive Abuse / Safety / Falls / Self Care Management Nursing Diagnoses: History of Falls Potential for injury related to falls Goals: Patient will not experience any injury related to falls Date Initiated: 09/22/2020 Target Resolution Date: 03/06/2021 Goal Status: Active Patient/caregiver will verbalize/demonstrate  measures taken to prevent injury and/or falls Date Initiated: 09/22/2020 Target Resolution Date: 03/06/2021 Goal Status: Active Interventions: Assess Activities of Daily Living upon admission and as needed Assess fall risk on admission and as needed Assess: immobility, friction, shearing, incontinence upon admission and as needed Assess impairment of mobility on admission and as needed per policy Assess personal safety and home safety (as indicated) on admission and as needed Assess self care needs on admission and as needed Provide education on fall prevention Notes: Nutrition Nursing Diagnoses: Impaired glucose control: actual or potential Potential for alteratiion in Nutrition/Potential for imbalanced nutrition Goals: Patient/caregiver agrees to and verbalizes understanding of need to use nutritional supplements and/or vitamins as prescribed Date Initiated: 09/22/2020 Date Inactivated: 11/06/2020 Target Resolution Date: 11/06/2020 Goal Status: Met Patient/caregiver will maintain therapeutic glucose control Date Initiated: 09/22/2020 Target Resolution Date: 03/06/2021 Goal Status: Active Interventions: Assess HgA1c results as ordered upon admission and as needed Assess patient nutrition upon admission and as needed per policy Provide education on elevated blood sugars and impact on wound healing Provide education on nutrition Treatment Activities: Education provided on Nutrition : 10/14/2020 Notes: Electronic Signature(s) Signed: 02/27/2021 6:16:52 PM By: Lorrin Jackson Entered By: Lorrin Jackson on 02/27/2021 17:17:52 -------------------------------------------------------------------------------- Pain Assessment Details Patient Name: Date of Service: [name] [facility] 02/27/2021 3:45 PM Medical Record Number: [id number] Patient Account Number: [id number] Date of Birth/Sex: Treating RN: [birth year] (45 y.o. [name] Primary Care Kawon Willcutt: [name] Other  Clinician: Referring Lou Irigoyen: Treating Doralene Glanz/Extender: [name], [name] in Treatment: 22 Active Problems Location of Pain Severity and Description of Pain Patient Has Paino No Site Locations Rate the pain. Current Pain Level: 0 Pain Management and Medication Current Pain Management: Electronic Signature(s) Signed: 02/27/2021 6:33:47 PM By: Baruch Gouty RN, BSN Entered By: Baruch Gouty on 02/27/2021 16:29:38 -------------------------------------------------------------------------------- Patient/Caregiver Education Details Patient Name: Date of Service: [name] [facility] 3/25/2022andnbsp3:45 PM Medical Record Number: [id number] Patient Account Number: [id number] Date of Birth/Gender: Treating RN: [birth year] (46 y.o. [name] Primary Care Physician: [name] Other Clinician: Referring Physician: Treating Physician/Extender: [name], [name] in Treatment: 22 Education Assessment Education Provided To: Patient Education Topics Provided Elevated Blood Sugar/ Impact on Healing: Methods: Explain/Verbal, Printed Responses: State content correctly Nutrition: Methods: Explain/Verbal, Printed Responses: State content correctly Wound/Skin Impairment: Methods: Explain/Verbal, Printed Responses: State content correctly Electronic Signature(s) Signed: 02/27/2021 6:16:52 PM By: Lorrin Jackson Entered By: Lorrin Jackson on 02/27/2021 17:18:22 -------------------------------------------------------------------------------- Wound Assessment Details Patient Name: Date of Service: [name] [facility] 02/27/2021 3:45 PM Medical Record Number: [id number] Patient Account Number: [id number] Date of Birth/Sex: Treating RN: 04-Feb-1976 (45 y.o. [name] Primary Care Clary Meeker: [name] Other Clinician: Referring Arneshia Ade: Treating Sheylin Scharnhorst/Extender: [name], [name] in Treatment:  22 Wound Status Wound Number: 4 Primary Diabetic Wound/Ulcer of the Lower Extremity Etiology: Wound Location: Right Amputation Site - Transmetatarsal Wound Open Wounding Event: Gradually Appeared Status: Date Acquired: 03/06/2020 Comorbid Asthma, Chronic  Obstructive Pulmonary Disease (COPD), Sleep Weeks Of Treatment: 22 History: Apnea, Congestive Heart Failure, Hypertension, Peripheral Venous Clustered Wound: No Disease, Type II Diabetes, Osteoarthritis, Osteomyelitis, Neuropathy Photos Wound Measurements Length: (cm) Width: (cm) Depth: (cm) Area: (cm) Volume: (cm) 0 % Reduction in Area: 100% 0 % Reduction in Volume: 100% 0 Epithelialization: Large (67-100%) 0 Tunneling: No 0 Undermining: No Wound Description Classification: Grade 2 Exudate Amount: None Present Foul Odor After Cleansing: No Slough/Fibrino No Wound Bed Granulation Amount: None Present (0%) Exposed Structure Necrotic Amount: None Present (0%) Fascia Exposed: No Fat Layer (Subcutaneous Tissue) Exposed: No Tendon Exposed: No Muscle Exposed: No Joint Exposed: No Bone Exposed: No Treatment Notes Wound #4 (Amputation Site - Transmetatarsal) Wound Laterality: Right Cleanser Soap and Water Discharge Instruction: May shower and wash wound with dial antibacterial soap and water prior to dressing change. Normal Saline Discharge Instruction: Cleanse the wound with Normal Saline prior to applying a clean dressing using gauze sponges, not tissue or cotton balls. Peri-Wound Care Topical Primary Dressing Hydrofera Blue Ready Foam, 4x5 in Discharge Instruction: Apply to wound bed as instructed. moisten with saline. Secondary Dressing Woven Gauze Sponge, Non-Sterile 4x4 in Discharge Instruction: Apply over primary dressing as directed. Secured With [employer], 4.5x3.1 (in/yd) Discharge Instruction: Secure with Kerlix as directed. Paper Tape, 2x10 (in/yd) Discharge Instruction: Secure dressing with  tape as directed. Compression Wrap Compression Stockings Add-Ons Electronic Signature(s) Signed: 02/27/2021 6:33:47 PM By: Baruch Gouty RN, BSN Signed: 03/04/2021 9:05:34 AM By: Sandre Kitty Entered By: Sandre Kitty on 02/27/2021 17:18:29 -------------------------------------------------------------------------------- Wound Assessment Details Patient Name: Date of Service: [name] [facility] 02/27/2021 3:45 PM Medical Record Number: [id number] Patient Account Number: [id number] Date of Birth/Sex: Treating RN: 09-24-1976 (45 y.o. [name] Primary Care Vonya Ohalloran: [name] Other Clinician: Referring Kambrie Eddleman: Treating Zeva Leber/Extender: [name], [name] in Treatment: 22 Wound Status Wound Number: 5 Primary Diabetic Wound/Ulcer of the Lower Extremity Etiology: Wound Location: Right, Posterior Lower Leg Wound Open Wounding Event: Not Known Status: Date Acquired: 11/05/2020 Comorbid Asthma, Chronic Obstructive Pulmonary Disease (COPD), Sleep Weeks Of Treatment: 10 History: Apnea, Congestive Heart Failure, Hypertension, Peripheral Venous Clustered Wound: No Disease, Type II Diabetes, Osteoarthritis, Osteomyelitis, Neuropathy Photos Wound Measurements Length: (cm) 1 Width: (cm) 1.3 Depth: (cm) 0.1 Area: (cm) 1.021 Volume: (cm) 0.102 % Reduction in Area: 54.9% % Reduction in Volume: 54.9% Epithelialization: Small (1-33%) Tunneling: No Undermining: No Wound Description Classification: Grade 2 Wound Margin: Flat and Intact Exudate Amount: Small Exudate Type: Serosanguineous Exudate Color: red, brown Foul Odor After Cleansing: No Slough/Fibrino Yes Wound Bed Granulation Amount: Large (67-100%) Exposed Structure Granulation Quality: Red Fascia Exposed: No Necrotic Amount: None Present (0%) Fat Layer (Subcutaneous Tissue) Exposed: Yes Tendon Exposed: No Muscle Exposed: No Joint Exposed: No Bone Exposed: No Treatment  Notes Wound #5 (Lower Leg) Wound Laterality: Right, Posterior Cleanser Soap and Water Discharge Instruction: May shower and wash wound with dial antibacterial soap and water prior to dressing change. Wound Cleanser Discharge Instruction: Cleanse the wound with wound cleanser prior to applying a clean dressing using gauze sponges, not tissue or cotton balls. Peri-Wound Care Topical Primary Dressing Hydrofera Blue Classic Foam, 4x4 in Discharge Instruction: Moisten with saline prior to applying to wound bed Secondary Dressing Woven Gauze Sponge, Non-Sterile 4x4 in Discharge Instruction: Apply over primary dressing as directed. Secured With 38M Medipore H Soft Cloth Surgical T 4 x 2 (in/yd) ape Discharge Instruction: Secure dressing with tape as directed. Compression Wrap Compression Stockings Add-Ons Electronic Signature(s) Signed:  02/27/2021 6:33:47 PM By: Baruch Gouty RN, BSN Signed: 03/04/2021 9:05:34 AM By: Sandre Kitty Entered By: Sandre Kitty on 02/27/2021 17:18:10 -------------------------------------------------------------------------------- Wound Assessment Details Patient Name: Date of Service: [name], Belinda [facility] 02/27/2021 3:45 PM Medical Record Number: [id number] Patient Account Number: [id number] Date of Birth/Sex: Treating RN: 10/09/1976 (45 y.o. [name] Primary Care Toyia Jelinek: [name] Other Clinician: Referring Mcihael Hinderman: Treating Kenisha Lynds/Extender: [name], [name] in Treatment: 22 Wound Status Wound Number: 6 Primary Abscess Etiology: Wound Location: Right Groin Wound Open Wounding Event: Blister Status: Date Acquired: 02/20/2021 Comorbid Asthma, Chronic Obstructive Pulmonary Disease (COPD), Sleep Weeks Of Treatment: 1 History: Apnea, Congestive Heart Failure, Hypertension, Peripheral Venous Clustered Wound: No Disease, Type II Diabetes, Osteoarthritis, Osteomyelitis, Neuropathy Photos Wound  Measurements Length: (cm) 0.6 Width: (cm) 0.9 Depth: (cm) 0.8 Area: (cm) 0.424 Volume: (cm) 0.339 % Reduction in Area: 64% % Reduction in Volume: 95.9% Epithelialization: None Tunneling: No Undermining: Yes Starting Position (o'clock): 12 Ending Position (o'clock): 12 Maximum Distance: (cm) 3.6 Wound Description Classification: Full Thickness Without Exposed Support Structures Wound Margin: Well defined, not attached Exudate Amount: Medium Exudate Type: Serosanguineous Exudate Color: red, brown Foul Odor After Cleansing: No Slough/Fibrino Yes Wound Bed Granulation Amount: Large (67-100%) Exposed Structure Granulation Quality: Red Fascia Exposed: No Necrotic Amount: None Present (0%) Fat Layer (Subcutaneous Tissue) Exposed: Yes Tendon Exposed: No Muscle Exposed: No Joint Exposed: No Bone Exposed: No Treatment Notes Wound #6 (Groin) Wound Laterality: Right Cleanser Soap and Water Discharge Instruction: May shower and wash wound with dial antibacterial soap and water prior to dressing change. Peri-Wound Care Topical Primary Dressing KerraCel Ag Gelling Fiber Dressing, 4x5 in (silver alginate) Discharge Instruction: Apply silver alginate to wound bed as instructed Secondary Dressing Woven Gauze Sponge, Non-Sterile 4x4 in Discharge Instruction: Apply over primary dressing as directed. ABD Pad, 5x9 Discharge Instruction: Apply over primary dressing as directed. Secured With 5M Medipore H Soft Cloth Surgical T 4 x 2 (in/yd) ape Discharge Instruction: Secure dressing with tape as directed. Compression Wrap Compression Stockings Add-Ons Electronic Signature(s) Signed: 02/27/2021 6:33:47 PM By: Baruch Gouty RN, BSN Signed: 03/04/2021 9:05:34 AM By: Sandre Kitty Entered By: Sandre Kitty on 02/27/2021 17:19:40 -------------------------------------------------------------------------------- Vitals Details Patient Name: Date of Service: [name], Belinda [facility]  02/27/2021 3:45 PM Medical Record Number: [id number] Patient Account Number: [id number] Date of Birth/Sex: Treating RN: 12-22-1975 (45 y.o. [name] Primary Care Jaja Switalski: [name] Other Clinician: Referring Aily Tzeng: Treating Nichoel Digiulio/Extender: [name], [name] in Treatment: 22 Vital Signs Time Taken: 16:27 Temperature (F): 98.1 Height (in): 62 Pulse (bpm): 93 Source: Stated Respiratory Rate (breaths/min): 18 Weight (lbs): 362 Blood Pressure (mmHg): 146/93 Source: Stated Reference Range: 80 - 120 mg / dl Body Mass Index (BMI): 66.2 Notes glucose over 400 per pt's meter at present Electronic Signature(s) Signed: 02/27/2021 6:33:47 PM By: Baruch Gouty RN, BSN Entered By: Baruch Gouty on 02/27/2021 16:29:20

## 2021-03-04 NOTE — Telephone Encounter (Signed)
Patient calls nurse line requesting DME order for Power Wheelchair.   Please see below.   Manual Wheelchair/Power Wheelchair   Please note this can be a long-drawn-out process. It can take 4-6 weeks up to 6 months for a patient to receive a new/rehab power wheelchair.   Place a Referral to Neuro Rehab and write in comments: Educational psychologist or Power Wheelchair Eval. Neuro Rehab uses different companies based on patients' needs.   If a patient chooses to use [employer] Andria Rhein is our Rep (Ph [phone]) and Daune Perch her associate Ph [phone] opt 1 for new opt 2 for Repairs.    Talbot Grumbling, RN

## 2021-03-04 NOTE — Telephone Encounter (Signed)
Referral entered to neuro rehab. Please inform patient  Thanks [name], MD

## 2021-03-05 ENCOUNTER — Encounter (HOSPITAL_BASED_OUTPATIENT_CLINIC_OR_DEPARTMENT_OTHER): Payer: Medicaid Other | Admitting: Internal Medicine

## 2021-03-06 DIAGNOSIS — R32 Unspecified urinary incontinence: Secondary | ICD-10-CM | POA: Diagnosis not present

## 2021-03-12 ENCOUNTER — Ambulatory Visit: Payer: Medicaid Other | Admitting: Licensed Clinical Social Worker

## 2021-03-12 DIAGNOSIS — Z7189 Other specified counseling: Secondary | ICD-10-CM

## 2021-03-12 NOTE — Chronic Care Management (AMB) (Signed)
Care Management Clinical Social Work Note  03/12/2021 Name: [name] MRN: [id number] DOB: 1976/05/14  [name] is a 45 y.o. year old female who is a primary care patient of [name], MD.  The Care Management team was consulted for assistance with chronic disease management and coordination needs to connect with counseling.  Engaged with patient by telephone for follow up visit in response to provider referral for social work chronic care management and care coordination services  Consent to Services:  [name] was given information about Care Management services today including:  1. Care Management services includes personalized support from designated clinical staff supervised by her physician, including individualized plan of care and coordination with other care providers 2. 24/7 contact phone numbers for assistance for urgent and routine care needs. 3. The patient may stop case management services at any time by phone call to the office staff. Patient agreed to services and consent obtained.   Assessment: Patient has not been able to go food resource provided but reports she still has the information and will go this weekend.  Reminder her of counseling appointment 03/24/21 with [facility]. (See Care Plan below for interventions and patient self-care actives). Recent life changes [place]: patient upset at she has not heard from referral place for Endocrinology. She does not want to go to [place]. Per referral coordinator [facility] not [facility] are taking new Medicaid Patients.  Patient was upset.  Follow up Plan: Patient does not require or desire continued follow-up. Will contact the office if needed.  No f/u scheduled.  LCSW will disconnect from care team if no needs in 90 days.   Review of patient past medical history, allergies, medications, and health status, including review of relevant consultants reports was performed today as part of a comprehensive  evaluation and provision of chronic care management and care coordination services.  SDOH (Social Determinants of Health) assessments and interventions performed:    Advanced Directives Status: Not addressed in this encounter.  Care Plan  Allergies  Allergen Reactions  . Cefepime Other (See Comments)    AKI, see records from [place] hospitalization in January 2020.    Marland Kitchen Kiwi Extract Shortness Of Breath and Swelling  . Morphine And Related Nausea And Vomiting  . Trental [Pentoxifylline] Nausea And Vomiting  . Nubain [Nalbuphine Hcl] Other (See Comments)    "FEELS LIKE SOMETHING CRAWLING ON ME"    Outpatient Encounter Medications as of 03/12/2021  Medication Sig  . Accu-Chek FastClix Lancets MISC USE DAILY AS INSTRUCTED  . ACCU-CHEK GUIDE test strip USE T0 CHECK BLOOD SUGAR 3 TIMES DAILY  . Accu-Chek Softclix Lancets lancets Use to check blood glucose four times daily  . aspirin EC 81 MG tablet Take 1 tablet (81 mg total) by mouth daily. Swallow whole.  Marland Kitchen atorvastatin (LIPITOR) 80 MG tablet Take 1 tablet (80 mg total) by mouth daily.  . budesonide-formoterol (SYMBICORT) 160-4.5 MCG/ACT inhaler Inhale 2 puffs into the lungs 2 (two) times daily.  . Cholecalciferol 1000 units tablet Take 1 tablet (1,000 Units total) by mouth daily.  . Compression Bandages MISC Wear daily for swelling  . Continuous Blood Gluc Sensor (DEXCOM G6 SENSOR) MISC Inject 1 applicator into the skin as directed. Change sensor every 10 days.  . Continuous Blood Gluc Transmit (DEXCOM G6 TRANSMITTER) MISC Inject 1 Device into the skin as directed. Reuse 8 times with sensor changes.  . gabapentin (NEURONTIN) 600 MG tablet TAKE 2 TABLETS BY MOUTH 3 TIMES DAILY  .  HYDROcodone-acetaminophen (NORCO) 10-325 MG tablet Take 1 tablet by mouth every 8 (eight) hours as needed for moderate pain.  Marland Kitchen HYDROcodone-acetaminophen (NORCO) 10-325 MG tablet Take 1 tablet by mouth every 8 (eight) hours as needed for moderate pain.  Marland Kitchen  HYDROcodone-acetaminophen (NORCO) 10-325 MG tablet Take 1 tablet by mouth every 8 (eight) hours as needed for moderate pain.  Marland Kitchen insulin aspart (NOVOLOG) 100 UNIT/ML FlexPen Inject 40 Units into the skin 3 (three) times daily with meals.  . insulin glargine (LANTUS SOLOSTAR) 100 UNIT/ML Solostar Pen Inject 100 Units into the skin 2 (two) times daily.  . Insulin Pen Needle (SURE COMFORT PEN NEEDLES) 32G X 4 MM MISC Use to inject insulin as indicated  . ipratropium-albuterol (DUONEB) 0.5-2.5 (3) MG/3ML SOLN Take 3 mLs by nebulization every 4 (four) hours as needed.  . liraglutide (VICTOZA) 18 MG/3ML SOPN Inject 1.2 mg into the skin daily.  Marland Kitchen loratadine (CLARITIN) 10 MG tablet Take 1 tablet (10 mg total) by mouth daily.  . metoprolol succinate (TOPROL-XL) 25 MG 24 hr tablet Take 25 mg by mouth daily.  . Misc. Devices (TRANSFER BENCH) MISC Use daily  . omeprazole (PRILOSEC) 40 MG capsule Take 1 capsule (40 mg total) by mouth daily.  . ondansetron (ZOFRAN ODT) 4 MG disintegrating tablet Take 1 tablet (4 mg total) by mouth every 8 (eight) hours as needed for nausea.  . potassium chloride SA (KLOR-CON M20) 20 MEQ tablet Take 1 tablet (20 mEq total) by mouth daily.  Marland Kitchen rOPINIRole (REQUIP) 1 MG tablet Take 1 tablet (1 mg total) by mouth at bedtime.  . Tiotropium Bromide Monohydrate (SPIRIVA RESPIMAT) 1.25 MCG/ACT AERS Inhale 2 puffs into the lungs daily.  Marland Kitchen torsemide (DEMADEX) 20 MG tablet TAKE 4 TABLETS (80 MG) BY MOUTH ONCE A DAY   No facility-administered encounter medications on file as of 03/12/2021.    Patient Active Problem List   Diagnosis Date Noted  . Urinary incontinence 10/23/2020  . Elevated troponin   . Acute respiratory failure with hypoxia and hypercarbia ([place]) 06/15/2020  . Chronic respiratory failure with hypoxia ([place]) 06/14/2020  . Blister of foot 04/21/2020  . Exposure to mold 04/21/2020  . Constipation 04/03/2020  . Left shoulder pain 06/23/2019  . Hyperglycemia due to diabetes  mellitus ([place])   . 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[place]) 04/11/2019  . Allergic rhinitis 03/06/2018  . Class 3 severe obesity due to excess calories with serious comorbidity and body mass index (BMI) of 50.0 to 59.9 in adult [facility])   . Decreased pedal pulses 06/10/2017  . Unilateral primary osteoarthritis, right knee 10/22/2016  . Primary osteoarthritis of first carpometacarpal joint of left hand 07/30/2016  . Diabetic neuropathy ([place]) 07/14/2016  . Nausea 03/17/2016  . De Quervain's tenosynovitis, bilateral 11/01/2015  . Vitamin D deficiency 09/05/2015  . Recurrent candidiasis of vagina 09/05/2015  . Varicose veins of leg with complications 25/04/3975  . Restless leg syndrome 10/17/2014  . Chronic sinusitis 07/18/2014  . Encounter for chronic pain management 06/30/2013  . HLD (hyperlipidemia) 11/19/2012  . Angina pectoris ([place]) 06/27/2012  . Abscess of skin and subcutaneous tissue 11/04/2011  . Right carpal tunnel syndrome 09/01/2011  . Bilateral knee pain 09/01/2011  . DM (diabetes mellitus) type II uncontrolled, periph vascular disorder ([place]) 05/22/2008  . Morbid obesity ([place]) 05/22/2008  . Obesity hypoventilation syndrome ([place]) 05/22/2008  . Mood disorder ([place]) 05/22/2008  . Obstructive sleep apnea 05/22/2008  . Hypertension 05/22/2008  . Asthma 05/22/2008  . GERD 05/22/2008  Conditions to be addressed/monitored: Depression; Limited access to food  Care Plan : [facility] Social Work  Updates made by Maurine Cane, LCSW since 03/12/2021 12:00 AM  Problem: Symptoms of Depression Identification   Long-Range Goal: Manage symptoms of depression and start counseling   Start Date: 02/19/2021  This Visit's Progress: On track  Recent Progress: On track  Priority: High  Current barriers:   . Chronic Mental Health needs related to symptoms of depression  Currently unable to independently self manage needs related to mental health conditions.  Knowledge Deficits related to short term  plan for care coordination  needs and long term plans for chronic disease management . Lacks knowledge of where and how to connect for counseling  . Needs Support, Education, and Care Coordination in order to meet unmet need.  Clinical Goal(s): Over the next 30 days, patient will work with SW to reduce or manage symptoms of depression and stress until connected for ongoing counseling.  Clinical Interventions:  . Assessed patient's needs, coping skills,support system and barriers to care . Provided basic mental health support, education and interventions  . Other interventions include: Solution-Focused Strategies and Emotional Supportive  . Collaboration with PCP regarding development and update of comprehensive plan of care as evidenced by provider attestation and co-signature . Inter-disciplinary care team collaboration (see longitudinal plan of care) Patient Goals/Self-Care Activities: Over the next 30 days . Complete papers for [facility] . Your appointment is April 19th at 12:00   Problem: Food insecurities   Priority: High  Onset Date: 02/19/2021  Current barriers:   . Patient in need of assistance with connecting to community resources for food . Acknowledges deficits with meeting this unmet need . Patient is unable to independently navigate community resource options without care coordination support Clinical Goals: patient will work with food resources to address needs related to running out of food each month Clinical Interventions:  . Collaboration with [name], MD regarding development and update of comprehensive plan of care as evidenced by provider attestation and co-signature . Inter-disciplinary care team collaboration (see longitudinal plan of care) . Assessment of needs, barriers and progress with connecting with food resources.  . Review various resources, discussed options and provided patient information about [employer] and Nutrition  Program and [employer] . Advised patient to she is able to do food pick up on Sat when her daughter can go with her. . Other interventions provided:Solution-Focused Strategies . Problem Solving Patient Goals/Self-Care Activities: Over the next 30 days . Do food pick up at [place] Sat. 9 to [place], [place] / [place]   [phone] 3:26 PM

## 2021-03-12 NOTE — Patient Instructions (Addendum)
  [name]  it was nice speaking with you. Please call me directly if you have questions about the goals we discussed. Goals Addressed            This Visit's Progress   . Begin and Stick with Counseling-Depression   On track    Timeframe:  Long-Range Goal Priority:  High Start Date:  02/19/21                           Expected End Date:  04/04/21                      Patient Goals/Self-Care Activities: Over the next 30 days . Complete papers for [facility] . Your appointment is April 19th at 12:00   Why is this important?    Beating depression may take some time.   If you don't feel better right away, don't give up on your treatment plan.     . Find Help in My Community   Not on track    Timeframe:  Long-Range Goal Priority:  High Start Date:   02/19/21                          Expected End Date:  on going                    Patient Goals/Self-Care Activities: Over the next 30 days . Do food pick up at [place] Sat. 9 to 12   Why is this important?    Knowing how and where to find help for yourself or family in your neighborhood and community is an important skill.       [name] received Care Coordination services today:  1. Care Coordination services include personalized support from designated clinical staff supervised by her physician, including individualized plan of care and coordination with other care providers 2. 24/7 contact [phone] for assistance for urgent and routine care needs. 3. Care Coordination are voluntary services and be declined at any time by calling the office.  Patient verbalizes understanding of instructions provided today.    Follow up plan: Client will call office if needed, no follow up scheduled   Maurine Cane, [place]

## 2021-03-13 ENCOUNTER — Encounter (HOSPITAL_BASED_OUTPATIENT_CLINIC_OR_DEPARTMENT_OTHER): Payer: Medicaid Other | Attending: Internal Medicine | Admitting: Internal Medicine

## 2021-03-13 ENCOUNTER — Other Ambulatory Visit: Payer: Self-pay

## 2021-03-13 DIAGNOSIS — L97512 Non-pressure chronic ulcer of other part of right foot with fat layer exposed: Secondary | ICD-10-CM | POA: Diagnosis not present

## 2021-03-13 DIAGNOSIS — L97218 Non-pressure chronic ulcer of right calf with other specified severity: Secondary | ICD-10-CM | POA: Diagnosis not present

## 2021-03-13 DIAGNOSIS — E1151 Type 2 diabetes mellitus with diabetic peripheral angiopathy without gangrene: Secondary | ICD-10-CM | POA: Diagnosis not present

## 2021-03-13 DIAGNOSIS — E11622 Type 2 diabetes mellitus with other skin ulcer: Secondary | ICD-10-CM | POA: Diagnosis not present

## 2021-03-13 DIAGNOSIS — Z89431 Acquired absence of right foot: Secondary | ICD-10-CM | POA: Diagnosis not present

## 2021-03-13 DIAGNOSIS — E11621 Type 2 diabetes mellitus with foot ulcer: Secondary | ICD-10-CM | POA: Insufficient documentation

## 2021-03-13 DIAGNOSIS — L02214 Cutaneous abscess of groin: Secondary | ICD-10-CM | POA: Insufficient documentation

## 2021-03-13 DIAGNOSIS — L97519 Non-pressure chronic ulcer of other part of right foot with unspecified severity: Secondary | ICD-10-CM | POA: Diagnosis present

## 2021-03-13 DIAGNOSIS — Z89512 Acquired absence of left leg below knee: Secondary | ICD-10-CM | POA: Diagnosis not present

## 2021-03-13 NOTE — Progress Notes (Signed)
[name], [name] ([id number]) Visit Report for 03/13/2021 Debridement Details Patient Name: Date of Service: [name] [facility] 03/13/2021 10:00 A M Medical Record Number: [id number] Patient Account Number: [id number] Date of Birth/Sex: Treating RN: 1976-04-19 (45 y.o. [name] Primary Care Provider: [name] Other Clinician: Referring Provider: Treating Provider/Extender: Mammie Russian, Ivan Anchors in Treatment: 24 Debridement Performed for Assessment: Wound #4 Right Amputation Site - Transmetatarsal Performed By: Physician Ricard Dillon., MD Debridement Type: Debridement Severity of Tissue Pre Debridement: Fat layer exposed Level of Consciousness (Pre-procedure): Awake and Alert Pre-procedure Verification/Time Out Yes - 11:13 Taken: Start Time: 11:15 Pain Control: Other : Benzocaine T Area Debrided (L x W): otal 1.5 (cm) x 1.2 (cm) = 1.8 (cm) Tissue and other material debrided: Non-Viable, Callus, Slough, Subcutaneous, Slough Level: Skin/Subcutaneous Tissue Debridement Description: Excisional Instrument: Curette Bleeding: Minimum Hemostasis Achieved: Pressure End Time: 11:17 Response to Treatment: Procedure was tolerated well Level of Consciousness (Post- Awake and Alert procedure): Post Debridement Measurements of Total Wound Length: (cm) 1.5 Width: (cm) 1.2 Depth: (cm) 0.1 Volume: (cm) 0.141 Character of Wound/Ulcer Post Debridement: Stable Severity of Tissue Post Debridement: Fat layer exposed Post Procedure Diagnosis Same as Pre-procedure Electronic Signature(s) Signed: 03/13/2021 4:45:31 PM By: Linton Ham MD Signed: 03/13/2021 5:03:53 PM By: Lorrin Jackson Entered By: Linton Ham on 03/13/2021 11:27:57 -------------------------------------------------------------------------------- HPI Details Patient Name: Date of Service: [name], [name] 03/13/2021 10:00 A M Medical Record Number: [id number] Patient Account Number: [id number] Date  of Birth/Sex: Treating RN: [birth year] (45 y.o. [name] Primary Care Provider: [name] Other Clinician: Referring Provider: Treating Provider/Extender: Mammie Russian, Ivan Anchors in Treatment: 24 History of Present Illness HPI Description: 05/30/19 patient presents today for initial evaluation our clinic concerning significant wounds that she has in the right gluteal region as well as in the left abdominal area. She also has a pressure injury to her trans metatarsal amputation site on the right which does not appear to be as looking at this time but nonetheless does need to close. This occurred while she was in the hospital. The patient was admitted to the hospital on 05/17/19 discharged on 05/25/19. This was due to the significant abscess in the right gluteal region as well as in the left panus region. She has a history of diabetes which is definitely not under good control of the most useful view of an agency being 17.5. She also has hypertension, COPD, and is morbidly obese. Unfortunately the wound and the right gluteal region extends right up and adjacent to the anus which is going to make the Wound VAC impossible at this point. Nonetheless I think that we're gonna have to basically pack this with wet to dry packing at this time in order to hopefully achieve improvement. Nonetheless this is a significant wound with her diabetes be not under control along with her size and again the nature of the wound I think this is gonna take a very long time to heal. 07/04/2019 upon evaluation today patient actually appears to be doing better in regard to her wounds. Her foot ulcer actually appears to be completely healed which is great the abdominal ulcer has filled in quite nicely and seems to be doing well although there is a lot of moisture I think that she would benefit from a silver alginate dressing. Nonetheless in regard to the wound on her right gluteal region this showed  signs of improvement today still this is a fairly significant ulcer unfortunately. 08/01/2019 on evaluation  patient presents for follow-up concerning her wounds over the right gluteal region as well as the left abdominal region. Fortunately both areas are showing signs of improvement which is good news. In fact significant improvement. Spent about a month since have seen her last. Overall I am extremely happy though with the findings of how the wounds have progressed at this point. READMISSION 09/22/2020 This is a now 45 year old woman previously in this clinic for a right gluteal abscess for 3 or 4 visits in 2020. She is a poorly controlled type II diabetic with the last hemoglobin A1c I see in epic at 12.7 in July 2021. She tells [place] that she is here for review of a wound on the tip of her right first metatarsal transmetatarsal amputation. She had an angiogram and angioplasty on 12/20/2018 with angioplasty of her anterior tibial and posterior tibial arteries. She has been followed by Dr. Doran Durand most recent treatment since August I think has been Santyl. She was given a 1 week course of Keflex in mid September. The patient's had an x-ray and also an MRI in August 2021 that did not show osteomyelitis but edema ABI in our clinic was 1.19 Past medical history includes hypertension, hyperlipidemia, COPD, bipolar disorder, poorly controlled type 2 diabetes with PAD. Left BKA, right transmetatarsal amputation, heart failure with preserved ejection fraction, she is minimally I am ambulatory walking only from her wheelchair to the bathroom with her walker. She only wears a stocking on her foot. Otherwise she is in wheelchair or in bed all day long. 11/1; patient readmitted to the clinic 2 weeks ago. She has a wound on the tip of the right first metatarsal transmetatarsal amputation site she has been using Santyl. She has Medicaid and we have to apply for assistance if we are going to continue with Santyl  although I am hopeful that we will be able to change from the [facility] [employer] next week. 11/9; area on the tip of the right first metatarsal amputation site. She has been using Santyl. She was in the hospital overnight for chest pain and they gave her another tube of Santyl which is nice since she has Medicaid and Medicaid would not pay for that. Her wound is smaller 12/2; tip of the right first metatarsal amputation site. She has been using Santyl. No changes wound dimension however I think the surface of the wound is better 12/28; patient has not been here in 3 and half weeks I think transportation issues. We gave her Hydrofera Blue last time however she switched back to [place] last week because somebody told her the wound was larger. The wound is actually measuring smaller and looks healthy. 1/13; since the patient was last here she was actually seen twice in the emergency department most recently on 12/30 this was for a painful small area on the right posterior calf superiorly. Previously she was seen in the ER on 12/19 for this problem but she did not tell us about this the last time she was here. There was some question of this being a spider bite based I think on somebody killing a spider in her apartment. When she was in the ER on 12/30 she complained of increasing pain and swelling. She had previously been given doxycycline but they treated with IV clindamycin transitioning to oral clindamycin. She is still using Hydrofera Blue to the area on the medial transmit amputation on the right this is not changed much today 1/27; 2-week follow-up. The patient has Hydrofera Blue on the  medial transmet amputation site. This looks better this week after debridement last week. She has a new area on the right posterior calf that we looked at last time she has been using Santyl to this that she had leftover from the days where Medicaid paid for this. 2/3; she did not tolerate the Iodoflex well on the back of her  leg although the surface of it looks a lot better today. She was approved for Santyl to this area through compassionate release. We are using Hydrofera Blue on the transmet wound on the first met head 2/10; we have been using Santyl on the posterior right leg and the wound on the medial first met head. 02/06/2021 upon evaluation today patient appears to be doing poorly in regard to her posterior leg as well as her foot ulcer. Both are showing signs of erythema and potentially infection at this point as well. There does not appear to be any signs of active infection locally that is a different story. There does not appear to be any signs of overall worsening with regard to the wounds but they are also not doing quite as well as it appears it was previous. I think that the [facility] however is a better dressing probably we can switch to using this on the posterior leg as well. Collagen may be appropriate at some point but I do not think were quite there yet. 3/18; the patient has a wound on her posterior right leg and the anterior part of her transmet site on the right foot. She was started on an antibiotic 2 weeks ago Levaquin she is still has 3 days above left. We have been using Hydrofera Blue in general the areas look better. HOWEVER she arrives in clinic today with a concern about an abscess in her right groin. Apparently she discovered this recently. Her daughter looked at it last night. 3/25; areas on the transmitted site on the right foot and on the right lower leg both looked better and her measuring smaller. She came in last week with what looked like an abscess site in the dependent part of her abdominal pannus in her groin. I cultured this area that did not specifically grow anything. This initially had wide tunneling depth although it is seems filled in quite well. 4/8; 2-week follow-up. The area on the transmet site on the right foot at the first toe level swollen and erythematous. Not  sure when this should started to deteriorate. She also tells me that she has been using this quite a bit more. The area on the right posterior calf had eschar on it this was removed with gauze this looks clean. The abscess in her groin has come in quite a bit. Electronic Signature(s) Signed: 03/13/2021 4:45:31 PM By: Linton Ham MD Entered By: Linton Ham on 03/13/2021 11:28:51 -------------------------------------------------------------------------------- Physical Exam Details Patient Name: Date of Service: [name], [name] 03/13/2021 10:00 A M Medical Record Number: [id number] Patient Account Number: [id number] Date of Birth/Sex: Treating RN: January 19, 1976 (45 y.o. [name] Primary Care Provider: [name] Other Clinician: Referring Provider: Treating Provider/Extender: Mammie Russian, Ivan Anchors in Treatment: 24 Constitutional Patient is hypotensive.. Pulse regular and within target range for patient.Marland Kitchen Respirations regular, non-labored and within target range.. Temperature is normal and within the target range for the patient.Marland Kitchen Appears in no distress. Notes Wound exam The original wound on the right transmetatarsal's head site debris erythema swelling. I removed callus from the circumference necrotic debris from the surface of  this wound. She is going to need antibiotics. I marked the area of erythema and swelling Posterior right calf looks better. I was never completely sure of the etiology of this wound but it looks as though it is progressing towards closure Right groin abscess looks like it is filling in. Electronic Signature(s) Signed: 03/13/2021 4:45:31 PM By: Linton Ham MD Entered By: Linton Ham on 03/13/2021 11:29:54 -------------------------------------------------------------------------------- Physician Orders Details Patient Name: Date of Service: [name], [name] 03/13/2021 10:00 A M Medical Record Number: [id number] Patient Account  Number: [id number] Date of Birth/Sex: Treating RN: 11-08-1976 (45 y.o. 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 Bathing/ Shower/ Hygiene May shower with protection but do not get wound dressing(s) wet. - use a cast protector when not changing the dressing. May shower and wash wound with soap and water. - with dressing changes only. Edema Control - Lymphedema / SCD / Other Right Lower Extremity Elevate legs to the level of the heart or above for 30 minutes daily and/or when sitting, a frequency of: - 3-4 times throughout the day. Avoid standing for long periods of time. Exercise regularly - may wear diabetic shoes now, may restart physical therapy, may start walking. Just ensure the filler in the diabetic shoe does not rub on the wound area. Moisturize legs daily. Off-Loading Other: - ensure no pressure to foot wound. Additional Orders / Instructions Other: - 03/13/21: Begin antibiotic Wound Treatment Wound #4 - Amputation Site - Transmetatarsal Wound Laterality: Right Cleanser: Soap and Water Every Other Day/30 Days Discharge Instructions: May shower and wash wound with dial antibacterial soap and water prior to dressing change. Cleanser: Normal Saline (Generic) Every Other Day/30 Days Discharge Instructions: Cleanse the wound with Normal  Saline prior to applying a clean dressing using gauze sponges, not tissue or cotton balls. Prim Dressing: KerraCel Ag Gelling Fiber Dressing, 2x2 in (silver alginate) Every Other Day/30 Days ary Discharge Instructions: Apply silver alginate to wound bed as instructed Secondary Dressing: Woven Gauze Sponge, Non-Sterile 4x4 in (Generic) Every Other Day/30 Days Discharge Instructions: Apply over primary dressing as directed. Secured With: [employer], 4.5x3.1 (in/yd) (Generic) Every Other Day/30 Days Discharge Instructions: Secure with Kerlix as directed. Secured With: Paper Tape, 2x10 (in/yd) (Generic) Every Other Day/30 Days Discharge Instructions: Secure dressing with tape as directed. Wound #5 - Lower Leg Wound Laterality: Right, Posterior Cleanser: Soap and Water 1 x Per Day/30 Days Discharge Instructions: May shower and wash wound with dial antibacterial soap and water prior to dressing change. Cleanser: Wound Cleanser 1 x Per Day/30 Days Discharge Instructions: Cleanse the wound with wound cleanser prior to applying a clean dressing using gauze sponges, not tissue or cotton balls. Prim Dressing: KerraCel Ag Gelling Fiber Dressing, 2x2 in (silver alginate) 1 x Per Day/30 Days ary Discharge Instructions: Apply silver alginate to wound bed as instructed Secondary Dressing: Woven Gauze Sponge, Non-Sterile 4x4 in 1 x Per Day/30 Days Discharge Instructions: Apply over primary dressing as directed. Secured With:  28M Medipore H Soft Cloth Surgical T 4 x 2 (in/yd) 1 x Per Day/30 Days ape Discharge Instructions: Secure dressing with tape as directed. Wound #6 - Groin Wound Laterality: Right Cleanser: Soap and Water Every Other Day/15 Days Discharge Instructions: May shower and wash wound with dial antibacterial soap and water prior to dressing change. Prim Dressing: KerraCel Ag Gelling Fiber Dressing, 4x5 in (silver alginate) Every Other Day/15 Days ary Discharge Instructions: Apply  silver alginate to wound bed as instructed Secondary Dressing: Woven Gauze Sponge, Non-Sterile 4x4 in Every Other Day/15 Days Discharge Instructions: Apply over primary dressing as directed. Secondary Dressing: ABD Pad, 5x9 Every Other Day/15 Days Discharge Instructions: Apply over primary dressing as directed. Secured With: 28M Medipore H Soft Cloth Surgical T 4 x 2 (in/yd) Every Other Day/15 Days ape Discharge Instructions: Secure dressing with tape as directed. Patient Medications llergies: cefepime, Trental, Nubain, kiwi, morphine A Notifications Medication Indication Start End celluitiis right foot 03/13/2021 doxycycline monohydrate DOSE oral 100 mg capsule - 1 capsule oral bid for 7 days Electronic Signature(s) Signed: 03/13/2021 11:32:05 AM By: Linton Ham MD Previous Signature: 03/13/2021 10:30:46 AM Version By: Lorrin Jackson Entered By: Linton Ham on 03/13/2021 11:32:02 -------------------------------------------------------------------------------- Problem List Details Patient Name: Date of Service: [name], [name] 03/13/2021 10:00 A M Medical Record Number: [id number] Patient Account Number: [id number] Date of Birth/Sex: Treating RN: Nov 17, 1976 (45 y.o. 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 12/18/2020 No Yes L02.214 Cutaneous abscess of groin 02/20/2021 No Yes L97.512 Non-pressure chronic ulcer of other part of right foot with fat layer  exposed 09/22/2020 No Yes L03.115 Cellulitis of right lower limb 03/13/2021 No Yes Inactive Problems Resolved Problems Electronic Signature(s) Signed: 03/13/2021 4:45:31 PM By: Linton Ham MD Previous Signature: 03/13/2021 10:30:20 AM Version By: Lorrin Jackson Entered By: Linton Ham on 03/13/2021 11:27:29 -------------------------------------------------------------------------------- Progress Note Details Patient Name: Date of Service: [name], [name] 03/13/2021 10:00 A M Medical Record Number: [id number] Patient Account Number: [id number] Date of Birth/Sex: Treating RN: February 18, 1976 (45 y.o. [name] Primary Care Provider: [name] Other Clinician: Referring Provider: Treating Provider/Extender: Mammie Russian, Ivan Anchors in Treatment: 24 Subjective History of Present Illness (HPI) 05/30/19 patient presents today for initial evaluation our clinic concerning significant wounds that she has in the right gluteal region as well as in the left abdominal area. She also has a pressure injury to her trans metatarsal amputation site on the right which does not appear to be as looking at this time but nonetheless does need to close. This occurred while she was in the hospital. The patient was admitted to the hospital on 05/17/19 discharged on 05/25/19. This was due to the significant abscess in the right gluteal region as well as in the left panus region. She has a history of diabetes which is definitely not under good control of the most useful view of an agency being 17.5. She also has hypertension, COPD, and is morbidly obese. Unfortunately the wound and the right gluteal region extends right up and adjacent to the anus which is going to make  the Wound VAC impossible at this point. Nonetheless I think that we're gonna have to basically pack this with wet to dry packing at this time in order to hopefully achieve improvement. Nonetheless this is a significant wound with  her diabetes be not under control along with her size and again the nature of the wound I think this is gonna take a very long time to heal. 07/04/2019 upon evaluation today patient actually appears to be doing better in regard to her wounds. Her foot ulcer actually appears to be completely healed which is great the abdominal ulcer has filled in quite nicely and seems to be doing well although there is a lot of moisture I think that she would benefit from a silver alginate dressing. Nonetheless in regard to the wound on her right gluteal region this showed signs of improvement today still this is a fairly significant ulcer unfortunately. 08/01/2019 on evaluation patient presents for follow-up concerning her wounds over the right gluteal region as well as the left abdominal region. Fortunately both areas are showing signs of improvement which is good news. In fact significant improvement. Spent about a month since have seen her last. Overall I am extremely happy though with the findings of how the wounds have progressed at this point. READMISSION 09/22/2020 This is a now 45 year old woman previously in this clinic for a right gluteal abscess for 3 or 4 visits in 2020. She is a poorly controlled type II diabetic with the last hemoglobin A1c I see in epic at 12.7 in July 2021. She tells [place] that she is here for review of a wound on the tip of her right first metatarsal transmetatarsal amputation. She had an angiogram and angioplasty on 12/20/2018 with angioplasty of her anterior tibial and posterior tibial arteries. She has been followed by Dr. Doran Durand most recent treatment since August I think has been Santyl. She was given a 1 week course of Keflex in mid September. The patient's had an x-ray and also an MRI in August 2021 that did not show osteomyelitis but edema ABI in our clinic was 1.19 Past medical history includes hypertension, hyperlipidemia, COPD, bipolar disorder, poorly controlled type 2  diabetes with PAD. Left BKA, right transmetatarsal amputation, heart failure with preserved ejection fraction, she is minimally I am ambulatory walking only from her wheelchair to the bathroom with her walker. She only wears a stocking on her foot. Otherwise she is in wheelchair or in bed all day long. 11/1; patient readmitted to the clinic 2 weeks ago. She has a wound on the tip of the right first metatarsal transmetatarsal amputation site she has been using Santyl. She has Medicaid and we have to apply for assistance if we are going to continue with Santyl although I am hopeful that we will be able to change from the [facility] next week. 11/9; area on the tip of the right first metatarsal amputation site. She has been using Santyl. She was in the hospital overnight for chest pain and they gave her another tube of Santyl which is nice since she has Medicaid and Medicaid would not pay for that. Her wound is smaller 12/2; tip of the right first metatarsal amputation site. She has been using Santyl. No changes wound dimension however I think the surface of the wound is better 12/28; patient has not been here in 3 and half weeks I think transportation issues. We gave her Hydrofera Blue last time however she switched back to [place] last week because somebody told her  the wound was larger. The wound is actually measuring smaller and looks healthy. 1/13; since the patient was last here she was actually seen twice in the emergency department most recently on 12/30 this was for a painful small area on the right posterior calf superiorly. Previously she was seen in the ER on 12/19 for this problem but she did not tell us about this the last time she was here. There was some question of this being a spider bite based I think on somebody killing a spider in her apartment. When she was in the ER on 12/30 she complained of increasing pain and swelling. She had previously been given doxycycline but they treated with  IV clindamycin transitioning to oral clindamycin. She is still using Hydrofera Blue to the area on the medial transmit amputation on the right this is not changed much today 1/27; 2-week follow-up. The patient has Hydrofera Blue on the medial transmet amputation site. This looks better this week after debridement last week. She has a new area on the right posterior calf that we looked at last time she has been using Santyl to this that she had leftover from the days where Medicaid paid for this. 2/3; she did not tolerate the Iodoflex well on the back of her leg although the surface of it looks a lot better today. She was approved for Santyl to this area through compassionate release. We are using Hydrofera Blue on the transmet wound on the first met head 2/10; we have been using Santyl on the posterior right leg and the wound on the medial first met head. 02/06/2021 upon evaluation today patient appears to be doing poorly in regard to her posterior leg as well as her foot ulcer. Both are showing signs of erythema and potentially infection at this point as well. There does not appear to be any signs of active infection locally that is a different story. There does not appear to be any signs of overall worsening with regard to the wounds but they are also not doing quite as well as it appears it was previous. I think that the [facility] however is a better dressing probably we can switch to using this on the posterior leg as well. Collagen may be appropriate at some point but I do not think were quite there yet. 3/18; the patient has a wound on her posterior right leg and the anterior part of her transmet site on the right foot. She was started on an antibiotic 2 weeks ago Levaquin she is still has 3 days above left. We have been using Hydrofera Blue in general the areas look better. HOWEVER she arrives in clinic today with a concern about an abscess in her right groin. Apparently she discovered this  recently. Her daughter looked at it last night. 3/25; areas on the transmitted site on the right foot and on the right lower leg both looked better and her measuring smaller. She came in last week with what looked like an abscess site in the dependent part of her abdominal pannus in her groin. I cultured this area that did not specifically grow anything. This initially had wide tunneling depth although it is seems filled in quite well. 4/8; 2-week follow-up. The area on the transmet site on the right foot at the first toe level swollen and erythematous. Not sure when this should started to deteriorate. She also tells me that she has been using this quite a bit more. The area on the right posterior  calf had eschar on it this was removed with gauze this looks clean. The abscess in her groin has come in quite a bit. Objective Constitutional Patient is hypotensive.. Pulse regular and within target range for patient.Marland Kitchen Respirations regular, non-labored and within target range.. Temperature is normal and within the target range for the patient.Marland Kitchen Appears in no distress. Vitals Time Taken: 10:34 AM, Height: 62 in, Weight: 362 lbs, BMI: 66.2, Temperature: 97.9 F, Pulse: 74 bpm, Respiratory Rate: 17 breaths/min, Blood Pressure: 84/57 mmHg. General Notes: Wound exam ooThe original wound on the right transmetatarsal's head site debris erythema swelling. I removed callus from the circumference necrotic debris from the surface of this wound. She is going to need antibiotics. I marked the area of erythema and swelling ooPosterior right calf looks better. I was never completely sure of the etiology of this wound but it looks as though it is progressing towards closure ooRight groin abscess looks like it is filling in. Integumentary (Hair, Skin) Wound #4 status is Open. Original cause of wound was Gradually Appeared. The date acquired was: 03/06/2020. The wound has been in treatment 24 weeks. The wound is  located on the Right Amputation Site - Transmetatarsal. The wound measures 1.5cm length x 1.2cm width x 0.1cm depth; 1.414cm^2 area and 0.141cm^3 volume. There is no tunneling or undermining noted. There is a medium amount of serosanguineous drainage noted. The wound margin is distinct with the outline attached to the wound base. There is no granulation within the wound bed. There is no necrotic tissue within the wound bed. Wound #5 status is Open. Original cause of wound was Not Known. The date acquired was: 11/05/2020. The wound has been in treatment 12 weeks. The wound is located on the Right,Posterior Lower Leg. The wound measures 0.5cm length x 1cm width x 0.1cm depth; 0.393cm^2 area and 0.039cm^3 volume. There is Fat Layer (Subcutaneous Tissue) exposed. There is no tunneling or undermining noted. There is a small amount of serosanguineous drainage noted. The wound margin is flat and intact. There is no granulation within the wound bed. There is a large (67-100%) amount of necrotic tissue within the wound bed including Eschar. Wound #6 status is Open. Original cause of wound was Blister. The date acquired was: 02/20/2021. The wound has been in treatment 3 weeks. The wound is located on the Right Groin. The wound measures 1cm length x 1cm width x 0.2cm depth; 0.785cm^2 area and 0.157cm^3 volume. There is Fat Layer (Subcutaneous Tissue) exposed. There is undermining starting at 12:00 and ending at 12:00 with a maximum distance of 3.5cm. There is a medium amount of serosanguineous drainage noted. The wound margin is well defined and not attached to the wound base. There is large (67-100%) red granulation within the wound bed. There is no necrotic tissue within the wound bed. 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 There was a Excisional Skin/Subcutaneous Tissue Debridement with a total area of 1.8 sq cm performed by Ricard Dillon., MD. With the following  instrument(s): Curette to remove Non-Viable tissue/material. Material removed includes Callus, Subcutaneous Tissue, and Slough after achieving pain control using Other (Benzocaine). No specimens were taken. A time out was conducted at 11:13, prior to the start of the procedure. A Minimum amount of bleeding was controlled with Pressure. The procedure was tolerated well. Post Debridement Measurements: 1.5cm length x 1.2cm width x 0.1cm depth; 0.141cm^3 volume. Character of Wound/Ulcer Post Debridement is stable. Severity of Tissue Post Debridement is: Fat layer exposed. Post procedure Diagnosis Wound #4: Same as Pre-Procedure Plan Follow-up Appointments: Return Appointment in 1 week. Bathing/ Shower/ Hygiene: May shower with protection but do not get wound dressing(s) wet. - use a cast protector when not changing the dressing. May shower and wash wound with soap and water. - with dressing changes only. Edema Control - Lymphedema / SCD / Other: Elevate legs to the level of the heart or above for 30 minutes daily and/or when sitting, a frequency of: - 3-4 times throughout the day. Avoid standing for long periods of time. Exercise regularly - may wear diabetic shoes now, may restart physical therapy, may start walking. Just ensure the filler in the diabetic shoe does not rub on the wound area. Moisturize legs daily. Off-Loading: Other: - ensure no pressure to  foot wound. Additional Orders / Instructions: Other: - 03/13/21: Begin antibiotic WOUND #4: - Amputation Site - Transmetatarsal Wound Laterality: Right Cleanser: Soap and Water Every Other Day/30 Days Discharge Instructions: May shower and wash wound with dial antibacterial soap and water prior to dressing change. Cleanser: Normal Saline (Generic) Every Other Day/30 Days Discharge Instructions: Cleanse the wound with Normal Saline prior to applying a clean dressing using gauze sponges, not tissue or cotton balls. Prim Dressing: KerraCel Ag Gelling Fiber Dressing, 2x2 in (silver alginate) Every Other Day/30 Days ary Discharge Instructions: Apply silver alginate to wound bed as instructed Secondary Dressing: Woven Gauze Sponge, Non-Sterile 4x4 in (Generic) Every Other Day/30 Days Discharge Instructions: Apply over primary dressing as directed. Secured With: [employer], 4.5x3.1 (in/yd) (Generic) Every Other Day/30 Days Discharge Instructions: Secure with Kerlix as directed. Secured With: Paper T ape, 2x10 (in/yd) (Generic) Every Other Day/30 Days Discharge Instructions: Secure dressing with tape as directed. WOUND #5: - Lower Leg Wound Laterality: Right, Posterior Cleanser: Soap and Water 1 x Per Day/30 Days Discharge Instructions: May shower and wash wound with dial antibacterial soap and water prior to dressing change. Cleanser: Wound Cleanser 1 x Per Day/30 Days Discharge Instructions: Cleanse the wound with wound cleanser prior to applying a clean dressing using gauze sponges, not tissue or cotton balls. Prim Dressing: KerraCel Ag Gelling Fiber Dressing, 2x2 in (silver alginate) 1 x Per Day/30 Days ary Discharge Instructions: Apply silver alginate to wound bed as instructed Secondary Dressing: Woven Gauze Sponge, Non-Sterile 4x4 in 1 x Per Day/30 Days Discharge Instructions: Apply over primary dressing as directed. Secured With: 63M Medipore H Soft Cloth Surgical T 4 x 2 (in/yd) 1 x  Per Day/30 Days ape Discharge Instructions: Secure dressing with tape as directed. WOUND #6: - Groin Wound Laterality: Right Cleanser: Soap and Water Every Other Day/15 Days Discharge Instructions: May shower and wash wound with dial antibacterial soap and water prior to dressing change. Prim Dressing: KerraCel Ag Gelling Fiber Dressing, 4x5 in (silver alginate) Every Other Day/15 Days ary Discharge Instructions: Apply silver alginate to wound bed as instructed Secondary Dressing: Woven Gauze Sponge, Non-Sterile 4x4 in Every Other Day/15 Days Discharge Instructions: Apply over primary dressing as directed. Secondary  Dressing: ABD Pad, 5x9 Every Other Day/15 Days Discharge Instructions: Apply over primary dressing as directed. Secured With: 65M Medipore H Soft Cloth Surgical T 4 x 2 (in/yd) Every Other Day/15 Days ape Discharge Instructions: Secure dressing with tape as directed. 1. I use silver alginate to all areas 2. Doxycycline 100 twice daily for 7 days I did not culture this. 3. Follow-up next week. At that point we will need to try and measure the groin site I did not do that today Electronic Signature(s) Signed: 03/13/2021 4:45:31 PM By: Linton Ham MD Entered By: Linton Ham on 03/13/2021 11:32:18 -------------------------------------------------------------------------------- SuperBill Details Patient Name: Date of Service: [name], TA [facility] 03/13/2021 Medical Record Number: [id number] Patient Account Number: [id number] Date of Birth/Sex: Treating RN: 11-Jun-1976 (45 y.o. [name] Primary Care Provider: [name] Other Clinician: Referring Provider: Treating Provider/Extender: Mammie Russian, Ivan Anchors in Treatment: 24 Diagnosis Coding ICD-10 Codes Code Description E11.621 Type 2 diabetes mellitus with foot ulcer L97.512 Non-pressure chronic ulcer of other part of right foot with fat layer exposed Z89.512 Acquired absence of left leg below  knee E11.51 Type 2 diabetes mellitus with diabetic peripheral angiopathy without gangrene L97.218 Non-pressure chronic ulcer of right calf with other specified severity L02.214 Cutaneous abscess of groin Facility Procedures CPT4 Code: [id number] Description: 12458 - DEB SUBQ TISSUE 20 SQ CM/< ICD-10 Diagnosis Description L97.512 Non-pressure chronic ulcer of other part of right foot with fat layer exposed E11.621 Type 2 diabetes mellitus with foot ulcer Modifier: Quantity: 1 Physician Procedures : CPT4 Code Description Modifier [id number] - WC PHYS SUBQ TISS 20 SQ CM ICD-10 Diagnosis Description L97.512 Non-pressure chronic ulcer of other part of right foot with fat layer exposed E11.621 Type 2 diabetes mellitus with foot ulcer Quantity: 1 Electronic Signature(s) Signed: 03/13/2021 4:45:31 PM By: Linton Ham MD Entered By: Linton Ham on 03/13/2021 11:32:30

## 2021-03-16 NOTE — Progress Notes (Signed)
BINA, VEENSTRA ([id number]) Visit Report for 03/13/2021 Arrival Information Details Patient Name: Date of Service: [name] [facility] 03/13/2021 10:00 A M Medical Record Number: [id number] Patient Account Number: [id number] Date of Birth/Sex: Treating RN: [birth year] (45 y.o. Tonita Phoenix, Lauren Primary Care Elleana Stillson: [name] Other Clinician: Referring Shaney Deckman: Treating Eulises Kijowski/Extender: Mammie Russian, Ivan Anchors in Treatment: 24 Visit Information History Since Last Visit Added or deleted any medications: No Patient Arrived: Wheel Chair Any new allergies or adverse reactions: No Arrival Time: 10:34 Had a fall or experienced change in No Accompanied By: self activities of daily living that may affect Transfer Assistance: None risk of falls: Patient Identification Verified: Yes Signs or symptoms of abuse/neglect since last visito No Secondary Verification Process Completed: Yes Hospitalized since last visit: No Patient Requires Transmission-Based Precautions: No Implantable device outside of the clinic excluding No Patient Has Alerts: No cellular tissue based products placed in the center since last visit: Has Dressing in Place as Prescribed: Yes Pain Present Now: No Electronic Signature(s) Signed: 03/16/2021 5:44:40 PM By: Rhae Hammock RN Entered By: Rhae Hammock on 03/13/2021 10:34:21 -------------------------------------------------------------------------------- Encounter Discharge Information Details Patient Name: Date of Service: [name], [name] 03/13/2021 10:00 A M Medical Record Number: [id number] Patient Account Number: [id number] Date of Birth/Sex: Treating RN: [birth year] (45 y.o. [name] Primary Care Camara Renstrom: [name] Other Clinician: Referring Kenecia Barren: Treating Xylina Rhoads/Extender: Mammie Russian, Ivan Anchors in Treatment: 24 Encounter Discharge Information Items Post Procedure Vitals Discharge Condition:  Stable Temperature (F): 97.9 Ambulatory Status: Wheelchair Pulse (bpm): 74 Discharge Destination: Home Respiratory Rate (breaths/min): 17 Transportation: Private Auto Blood Pressure (mmHg): 84/57 Accompanied By: daughter Schedule Follow-up Appointment: Yes Clinical Summary of Care: Electronic Signature(s) Signed: 03/13/2021 4:53:51 PM By: Deon Pilling Entered By: Deon Pilling on 03/13/2021 11:40:10 -------------------------------------------------------------------------------- Lower Extremity Assessment Details Patient Name: Date of Service: [name] [facility] 03/13/2021 10:00 A M Medical Record Number: [id number] Patient Account Number: [id number] Date of Birth/Sex: Treating RN: 10-16-1976 (45 y.o. Tonita Phoenix, Lauren Primary Care Adraine Biffle: [name] Other Clinician: Referring Sapir Lavey: Treating Shameka Aggarwal/Extender: Mammie Russian, Ivan Anchors in Treatment: 24 Edema Assessment Assessed: Shirlyn Goltz: No] Patrice Paradise: Yes] Edema: [Left: Ye] [Right: s] Calf Left: Right: Point of Measurement: From Medial Instep 45.5 cm Ankle Left: Right: Point of Measurement: From Medial Instep 24.5 cm Vascular Assessment Pulses: Dorsalis Pedis Palpable: [Right:Yes] Posterior Tibial Palpable: [Right:Yes] Electronic Signature(s) Signed: 03/16/2021 5:44:40 PM By: Rhae Hammock RN Entered By: Rhae Hammock on 03/13/2021 10:36:08 -------------------------------------------------------------------------------- Multi Wound Chart Details Patient Name: Date of Service: [name], [name] 03/13/2021 10:00 A M Medical Record Number: [id number] Patient Account Number: [id number] Date of Birth/Sex: Treating RN: Oct 15, 1976 (45 y.o. [name] Primary Care Zyeir Dymek: [name] Other Clinician: Referring Aloise Copus: Treating Ladina Shutters/Extender: Mammie Russian, Ivan Anchors in Treatment: 24 Vital Signs Height(in): 1 Pulse(bpm): 14 Weight(lbs): 76 Blood  Pressure(mmHg): 84/57 Body Mass Index(BMI): 66 Temperature(F): 97.9 Respiratory Rate(breaths/min): 30 Photos: [4:No Photos Right Amputation Site -] [5:No Photos Right, Posterior Lower Leg] [6:No Photos Right Groin] Wound Location: [4:Transmetatarsal Gradually Appeared] [5:Not Known] [6:Blister] Wounding Event: [4:Diabetic Wound/Ulcer of the Lower] [5:Diabetic Wound/Ulcer of the Lower] [6:Abscess] Primary Etiology: [4:Extremity Asthma, Chronic Obstructive] [5:Extremity Asthma, Chronic Obstructive] [6:Asthma, Chronic Obstructive] Comorbid History: [4:Pulmonary Disease (COPD), Sleep Apnea, Congestive Heart Failure, Hypertension, Peripheral Venous Disease, Type II Diabetes, Osteoarthritis, Osteomyelitis, Neuropathy 03/06/2020] [5:Pulmonary Disease (COPD), SleepPulmonary Disease  (COPD), Sleep Apnea, Congestive Heart Failure, Hypertension, Peripheral VenousHypertension, Peripheral Venous Disease, Type II Diabetes, Osteoarthritis, Osteomyelitis, Osteoarthritis, Osteomyelitis, Neuropathy  11/05/2020] [6:Disease, Type II Diabetes,  Neuropathy 02/20/2021] Date Acquired: [4:24] [5:12] [6:3] Weeks of Treatment: [4:Open] [5:Open] [6:Open] Wound Status: [4:1.5x1.2x0.1] [5:0.5x1x0.1] [6:1x1x0.2] Measurements L x W x D (cm) [4:1.414] [5:0.393] [6:0.785] A (cm) : rea [4:0.141] [5:0.039] [6:0.157] Volume (cm) : [4:-63.70%] [5:82.60%] [6:33.40%] % Reduction in A [4:rea: 45.60%] [5:82.70%] [6:98.10%] % Reduction in Volume: [6:12] Starting Position 1 (o'clock): [6:12] Ending Position 1 (o'clock): [6:3.5] Maximum Distance 1 (cm): [4:No] [5:No] [6:Yes] Undermining: [4:Grade 2] [5:Grade 2] [6:Full Thickness Without Exposed] Classification: [4:Medium] [5:Small] [6:Support Structures Medium] Exudate A mount: [4:Serosanguineous] [5:Serosanguineous] [6:Serosanguineous] Exudate Type: [4:red, brown] [5:red, brown] [6:red, brown] Exudate Color: [4:Distinct, outline attached] [5:Flat and Intact] [6:Well defined, not  attached] Wound Margin: [4:None Present (0%)] [5:None Present (0%)] [6:Large (67-100%)] Granulation A mount: [4:N/A] [5:N/A] [6:Red] Granulation Quality: [4:None Present (0%)] [5:Large (67-100%)] [6:None Present (0%)] Necrotic A mount: [4:N/A] [5:Eschar] [6:N/A] Necrotic Tissue: [4:Fascia: No] [5:Fat Layer (Subcutaneous Tissue): Yes Fat Layer (Subcutaneous Tissue): Yes] Exposed Structures: [4:Fat Layer (Subcutaneous Tissue): No Fascia: No Tendon: No Muscle: No Joint: No Bone: No Large (67-100%)] [5:Tendon: No Muscle: No Joint: No Bone: No Small (1-33%)] [6:Fascia: No Tendon: No Muscle: No Joint: No Bone: No None] Epithelialization: [4:Debridement - Excisional] [5:N/A] [6:N/A] Debridement: Pre-procedure Verification/Time Out 11:13 [5:N/A] [6:N/A] Taken: [4:Other] [5:N/A] [6:N/A] Pain Control: [4:Callus, Subcutaneous, Slough] [5:N/A] [6:N/A] Tissue Debrided: [4:Skin/Subcutaneous Tissue] [5:N/A] [6:N/A] Level: [4:1.8] [5:N/A] [6:N/A] Debridement A (sq cm): [4:rea Curette] [5:N/A] [6:N/A] Instrument: [4:Minimum] [5:N/A] [6:N/A] Bleeding: [4:Pressure] [5:N/A] [6:N/A] Hemostasis A chieved: [4:Procedure was tolerated well] [5:N/A] [6:N/A] Debridement Treatment Response: [4:1.5x1.2x0.1] [5:N/A] [6:N/A] Post Debridement Measurements L x W x D (cm) [4:0.141] [5:N/A] [6:N/A] Post Debridement Volume: (cm) [4:Debridement] [5:N/A] [6:N/A] Treatment Notes Electronic Signature(s) Signed: 03/13/2021 4:45:31 PM By: Linton Ham MD Signed: 03/13/2021 5:03:53 PM By: Lorrin Jackson Entered By: Linton Ham on 03/13/2021 11:27:38 -------------------------------------------------------------------------------- Multi-Disciplinary Care Plan Details Patient Name: Date of Service: [name], Belinda [facility] 03/13/2021 10:00 A M Medical Record Number: [id number] Patient Account Number: [id number] Date of Birth/Sex: Treating RN: Dec 17, 1975 (45 y.o. [name] Primary Care Datha Kissinger: [name] Other  Clinician: Referring Nhi Butrum: Treating Eilene Voigt/Extender: Mammie Russian, Ivan Anchors in Treatment: 24 Multidisciplinary Care Plan reviewed with physician Active Inactive Abuse / Safety / Falls / Self Care Management Nursing Diagnoses: History of Falls Potential for injury related to falls Goals: Patient will not experience any injury related to falls Date Initiated: 09/22/2020 Target Resolution Date: 04/10/2021 Goal Status: Active Patient/caregiver will verbalize/demonstrate measures taken to prevent injury and/or falls Date Initiated: 09/22/2020 Target Resolution Date: 04/10/2021 Goal Status: Active Interventions: Assess Activities of Daily Living upon admission and as needed Assess fall risk on admission and as needed Assess: immobility, friction, shearing, incontinence upon admission and as needed Assess impairment of mobility on admission and as needed per policy Assess personal safety and home safety (as indicated) on admission and as needed Assess self care needs on admission and as needed Provide education on fall prevention Notes: Nutrition Nursing Diagnoses: Impaired glucose control: actual or potential Potential for alteratiion in Nutrition/Potential for imbalanced nutrition Goals: Patient/caregiver agrees to and verbalizes understanding of need to use nutritional supplements and/or vitamins as prescribed Date Initiated: 09/22/2020 Date Inactivated: 11/06/2020 Target Resolution Date: 11/06/2020 Goal Status: Met Patient/caregiver will maintain therapeutic glucose control Date Initiated: 09/22/2020 Target Resolution Date: 03/06/2021 Goal Status: Active Interventions: Assess HgA1c results as ordered upon admission and as needed Assess patient nutrition upon admission and as needed per policy Provide education on elevated blood sugars and impact  on wound healing Provide education on nutrition Treatment Activities: Education provided on Nutrition :  02/27/2021 Notes: Electronic Signature(s) Signed: 03/13/2021 10:53:40 AM By: Lorrin Jackson Entered By: Lorrin Jackson on 03/13/2021 10:53:39 -------------------------------------------------------------------------------- Pain Assessment Details Patient Name: Date of Service: [name] [facility] 03/13/2021 10:00 A M Medical Record Number: [id number] Patient Account Number: [id number] Date of Birth/Sex: Treating RN: 10/03/1976 (45 y.o. Tonita Phoenix, Lauren Primary Care Michaeljoseph Revolorio: [name] Other Clinician: Referring Anasia Agro: Treating Madysen Faircloth/Extender: Mammie Russian, Ivan Anchors in Treatment: 24 Active Problems Location of Pain Severity and Description of Pain Patient Has Paino No Patient Has Paino No Site Locations Pain Management and Medication Current Pain Management: Electronic Signature(s) Signed: 03/16/2021 5:44:40 PM By: Rhae Hammock RN Entered By: Rhae Hammock on 03/13/2021 10:36:00 -------------------------------------------------------------------------------- Patient/Caregiver Education Details Patient Name: Date of Service: [name], [name] 4/8/2022andnbsp10:00 A M Medical Record Number: [id number] Patient Account Number: [id number] Date of Birth/Gender: Treating RN: Apr 27, 1976 (45 y.o. [name] Primary Care Physician: [name] Other Clinician: Referring Physician: Treating Physician/Extender: Mammie Russian, Ivan Anchors in Treatment: 24 Education Assessment Education Provided To: Patient Education Topics Provided Nutrition: Methods: Explain/Verbal, Printed Responses: State content correctly Wound/Skin Impairment: Methods: Demonstration, Explain/Verbal, Printed Responses: State content correctly Electronic Signature(s) Signed: 03/13/2021 5:03:53 PM By: Lorrin Jackson Entered By: Lorrin Jackson on 03/13/2021 11:03:38 -------------------------------------------------------------------------------- Wound  Assessment Details Patient Name: Date of Service: [name], Belinda [facility] 03/13/2021 10:00 A M Medical Record Number: [id number] Patient Account Number: [id number] Date of Birth/Sex: Treating RN: 31-Jan-1976 (45 y.o. 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 Wound Open Wounding Event: Gradually Appeared Status: Date Acquired: 03/06/2020 Comorbid Asthma, Chronic Obstructive Pulmonary Disease (COPD), Sleep Weeks Of Treatment: 24 History: Apnea, Congestive Heart Failure, Hypertension, Peripheral Venous Clustered Wound: No Disease, Type II Diabetes, Osteoarthritis, Osteomyelitis, Neuropathy Photos Wound Measurements Length: (cm) 1.5 Width: (cm) 1.2 Depth: (cm) 0.1 Area: (cm) 1.414 Volume: (cm) 0.141 % Reduction in Area: -63.7% % Reduction in Volume: 45.6% Epithelialization: Large (67-100%) Tunneling: No Undermining: No Wound Description Classification: Grade 2 Wound Margin: Distinct, outline attached Exudate Amount: Medium Exudate Type: Serosanguineous Exudate Color: red, brown Foul Odor After Cleansing: No Slough/Fibrino No Wound Bed Granulation Amount: None Present (0%) Exposed Structure Necrotic Amount: None Present (0%) Fascia Exposed: No Fat Layer (Subcutaneous Tissue) Exposed: No Tendon Exposed: No Muscle Exposed: No Joint Exposed: No Bone Exposed: No Treatment Notes Wound #4 (Amputation Site - Transmetatarsal) Wound Laterality: Right Cleanser Soap and Water Discharge Instruction: May shower and wash wound with dial antibacterial soap and water prior to dressing change. Normal Saline Discharge Instruction: Cleanse the wound with Normal Saline prior to applying a clean dressing using  gauze sponges, not tissue or cotton balls. Peri-Wound Care Topical Primary Dressing KerraCel Ag Gelling Fiber Dressing, 2x2 in (silver alginate) Discharge Instruction: Apply silver alginate to wound bed as instructed Secondary Dressing Woven Gauze Sponge, Non-Sterile 4x4 in Discharge Instruction: Apply over primary dressing as directed. Secured With [employer], 4.5x3.1 (in/yd) Discharge Instruction: Secure with Kerlix as directed. Paper Tape, 2x10 (in/yd) Discharge Instruction: Secure dressing with tape as directed. Compression Wrap Compression Stockings Add-Ons Electronic Signature(s) Signed: 03/16/2021 7:58:59 AM By: Sandre Kitty Signed: 03/16/2021 5:44:40 PM By: Rhae Hammock RN Entered By: Sandre Kitty on 03/13/2021 16:50:15 -------------------------------------------------------------------------------- Wound Assessment Details Patient Name:  Date of Service: [name] [facility] 03/13/2021 10:00 A M Medical Record Number: [id number] Patient Account Number: [id number] Date of Birth/Sex: Treating RN: 08/31/1976 (45 y.o. Tonita Phoenix, Lauren Primary Care Keondrick Dilks: [name] Other Clinician: Referring Janeice Stegall: Treating Fernanda Twaddell/Extender: Mammie Russian, Ivan Anchors in Treatment: 24 Wound Status Wound Number: 5 Primary Diabetic Wound/Ulcer of the Lower Extremity Etiology: Wound Location: Right, Posterior Lower Leg Wound Open Wounding Event: Not Known Status: Date Acquired: 11/05/2020 Comorbid Asthma, Chronic Obstructive Pulmonary Disease (COPD), Sleep Weeks Of Treatment: 12 History: Apnea, Congestive Heart Failure, Hypertension, Peripheral Venous Clustered Wound: No Disease, Type II Diabetes, Osteoarthritis, Osteomyelitis, Neuropathy Photos Wound Measurements Length: (cm) 0.5 Width: (cm) 1 Depth: (cm) 0.1 Area: (cm) 0.393 Volume: (cm) 0.039 % Reduction in Area: 82.6% % Reduction in Volume: 82.7% Epithelialization: Small  (1-33%) Tunneling: No Undermining: No Wound Description Classification: Grade 2 Wound Margin: Flat and Intact Exudate Amount: Small Exudate Type: Serosanguineous Exudate Color: red, brown Wound Bed Granulation Amount: None Present (0%) Necrotic Amount: Large (67-100%) Necrotic Quality: Eschar Foul Odor After Cleansing: No Slough/Fibrino Yes Exposed Structure Fascia Exposed: No Fat Layer (Subcutaneous Tissue) Exposed: Yes Tendon Exposed: No Muscle Exposed: No Joint Exposed: No Bone Exposed: No Treatment Notes Wound #5 (Lower Leg) Wound Laterality: Right, Posterior Cleanser Soap and Water Discharge Instruction: May shower and wash wound with dial antibacterial soap and water prior to dressing change. Wound Cleanser Discharge Instruction: Cleanse the wound with wound cleanser prior to applying a clean dressing using gauze sponges, not tissue or cotton balls. Peri-Wound Care Topical Primary Dressing KerraCel Ag Gelling Fiber Dressing, 2x2 in (silver alginate) Discharge Instruction: Apply silver alginate to wound bed as instructed Secondary Dressing Woven Gauze Sponge, Non-Sterile 4x4 in Discharge Instruction: Apply over primary dressing as directed. Secured With 63M Medipore H Soft Cloth Surgical T 4 x 2 (in/yd) ape Discharge Instruction: Secure dressing with tape as directed. Compression Wrap Compression Stockings Add-Ons Electronic Signature(s) Signed: 03/16/2021 7:58:59 AM By: Sandre Kitty Signed: 03/16/2021 5:44:40 PM By: Rhae Hammock RN Entered By: Sandre Kitty on 03/13/2021 16:49:46 -------------------------------------------------------------------------------- Wound Assessment Details Patient Name: Date of Service: [name], [name] 03/13/2021 10:00 A M Medical Record Number: [id number] Patient Account Number: [id number] Date of Birth/Sex: Treating RN: [birth year] (45 y.o. Tonita Phoenix, Lauren Primary Care Kseniya Grunden: [name] Other  Clinician: Referring Sabatino Williard: Treating Aranza Geddes/Extender: Mammie Russian, Ivan Anchors in Treatment: 24 Wound Status Wound Number: 6 Primary Abscess Etiology: Wound Location: Right Groin Wound Open Wounding Event: Blister Status: Date Acquired: 02/20/2021 Comorbid Asthma, Chronic Obstructive Pulmonary Disease (COPD), Sleep Weeks Of Treatment: 3 History: Apnea, Congestive Heart Failure, Hypertension, Peripheral Venous Clustered Wound: No Disease, Type II Diabetes, Osteoarthritis, Osteomyelitis, Neuropathy Photos Wound Measurements Length: (cm) 1 Width: (cm) 1 Depth: (cm) 0.2 Area: (cm) 0.785 Volume: (cm) 0.157 % Reduction in Area: 33.4% % Reduction in Volume: 98.1% Epithelialization: None Undermining: Yes Starting Position (o'clock): 12 Ending Position (o'clock): 12 Maximum Distance: (cm) 3.5 Wound Description Classification: Full Thickness Without Exposed Support Structu Wound Margin: Well defined, not attached Exudate Amount: Medium Exudate Type: Serosanguineous Exudate Color: red, brown res Foul Odor After Cleansing: No Slough/Fibrino Yes Wound Bed Granulation Amount: Large (67-100%) Exposed Structure Granulation Quality: Red Fascia Exposed: No Necrotic Amount: None Present (0%) Fat Layer (Subcutaneous Tissue) Exposed: Yes Tendon Exposed: No Muscle Exposed: No Joint Exposed: No Bone Exposed: No Treatment Notes Wound #6 (Groin) Wound Laterality: Right Cleanser Soap and Water Discharge Instruction: May shower and wash wound with dial antibacterial soap and water  prior to dressing change. Peri-Wound Care Topical Primary Dressing KerraCel Ag Gelling Fiber Dressing, 4x5 in (silver alginate) Discharge Instruction: Apply silver alginate to wound bed as instructed Secondary Dressing Woven Gauze Sponge, Non-Sterile 4x4 in Discharge Instruction: Apply over primary dressing as directed. ABD Pad, 5x9 Discharge Instruction: Apply over primary  dressing as directed. Secured With 7M Medipore H Soft Cloth Surgical T 4 x 2 (in/yd) ape Discharge Instruction: Secure dressing with tape as directed. Compression Wrap Compression Stockings Add-Ons Electronic Signature(s) Signed: 03/16/2021 7:58:59 AM By: Sandre Kitty Signed: 03/16/2021 5:44:40 PM By: Rhae Hammock RN Entered By: Sandre Kitty on 03/13/2021 16:49:28 -------------------------------------------------------------------------------- Vitals Details Patient Name: Date of Service: [name], [name] 03/13/2021 10:00 A M Medical Record Number: [id number] Patient Account Number: [id number] Date of Birth/Sex: Treating RN: 08-Aug-1976 (45 y.o. Tonita Phoenix, Lauren Primary Care Quante Pettry: [name] Other Clinician: Referring Mann Skaggs: Treating Jaleesa Cervi/Extender: Mammie Russian, Ivan Anchors in Treatment: 24 Vital Signs Time Taken: 10:34 Temperature (F): 97.9 Height (in): 62 Pulse (bpm): 74 Weight (lbs): 362 Respiratory Rate (breaths/min): 17 Body Mass Index (BMI): 66.2 Blood Pressure (mmHg): 84/57 Reference Range: 80 - 120 mg / dl Electronic Signature(s) Signed: 03/16/2021 5:44:40 PM By: Rhae Hammock RN Entered By: Rhae Hammock on 03/13/2021 10:34:42

## 2021-03-18 ENCOUNTER — Encounter: Payer: Self-pay | Admitting: Family Medicine

## 2021-03-20 ENCOUNTER — Encounter (HOSPITAL_BASED_OUTPATIENT_CLINIC_OR_DEPARTMENT_OTHER): Payer: Medicaid Other | Admitting: Internal Medicine

## 2021-03-23 ENCOUNTER — Other Ambulatory Visit: Payer: Self-pay | Admitting: Family Medicine

## 2021-03-24 ENCOUNTER — Ambulatory Visit: Payer: Medicaid Other | Admitting: Licensed Clinical Social Worker

## 2021-03-24 DIAGNOSIS — Z7189 Other specified counseling: Secondary | ICD-10-CM

## 2021-03-24 NOTE — Chronic Care Management (AMB) (Signed)
Care Management   Clinical Social Work Note  03/24/2021 Name: [name] MRN: [id number] DOB: [birth year]  [name] is a 45 y.o. year old female who is a primary care patient of [name], MD. The CCM team was consulted to assist the patient with chronic disease management and/or care coordination needs related to: Mental Health Counseling and Resources.   Engaged with patient by telephone for follow up visit in response to provider referral for social work chronic care management and care coordination services.   Consent to Services:  The patient was given information about Chronic Care Management services, agreed to services, and gave verbal consent prior to initiation of services.  Please see initial visit note for detailed documentation.   Patient agreed to services and consent obtained.  Assessment: Patient continues to experience difficulty with connecting for ongoing counseling.. See Care Plan below for interventions and patient self-care actives. Recent life changes /stressors: missed counseling appointment  Recommendation: Patient may benefit from, and is in agreement to f/u with [facility] to make sure they received her paperwork.  Follow up Plan: Patient would like continued follow-up.  CCM LCSW will f/u with patient in 1 week. Patient will call office if needed prior to next encounter.     Review of patient past medical history, allergies, medications, and health status, including review of relevant consultants reports was performed today as part of a comprehensive evaluation and provision of chronic care management and care coordination services.     SDOH (Social Determinants of Health) assessments and interventions performed:    Advanced Directives Status: Not addressed in this encounter.  CCM Care Plan  Allergies  Allergen Reactions  . Cefepime Other (See Comments)    AKI, see records from [place] hospitalization in January 2020.    Marland Kitchen Kiwi Extract Shortness  Of Breath and Swelling  . Morphine And Related Nausea And Vomiting  . Trental [Pentoxifylline] Nausea And Vomiting  . Nubain [Nalbuphine Hcl] Other (See Comments)    "FEELS LIKE SOMETHING CRAWLING ON ME"    Outpatient Encounter Medications as of 03/24/2021  Medication Sig  . Accu-Chek FastClix Lancets MISC USE DAILY AS INSTRUCTED  . ACCU-CHEK GUIDE test strip USE T0 CHECK BLOOD SUGAR 3 TIMES DAILY  . Accu-Chek Softclix Lancets lancets Use to check blood glucose four times daily  . aspirin EC 81 MG tablet Take 1 tablet (81 mg total) by mouth daily. Swallow whole.  Marland Kitchen atorvastatin (LIPITOR) 80 MG tablet Take 1 tablet (80 mg total) by mouth daily.  . budesonide-formoterol (SYMBICORT) 160-4.5 MCG/ACT inhaler Inhale 2 puffs into the lungs 2 (two) times daily.  . Cholecalciferol 1000 units tablet Take 1 tablet (1,000 Units total) by mouth daily.  . Compression Bandages MISC Wear daily for swelling  . Continuous Blood Gluc Sensor (DEXCOM G6 SENSOR) MISC Inject 1 applicator into the skin as directed. Change sensor every 10 days.  . Continuous Blood Gluc Transmit (DEXCOM G6 TRANSMITTER) MISC Inject 1 Device into the skin as directed. Reuse 8 times with sensor changes.  . gabapentin (NEURONTIN) 600 MG tablet TAKE 2 TABLETS BY MOUTH 3 TIMES DAILY  . HYDROcodone-acetaminophen (NORCO) 10-325 MG tablet Take 1 tablet by mouth every 8 (eight) hours as needed for moderate pain.  Marland Kitchen HYDROcodone-acetaminophen (NORCO) 10-325 MG tablet Take 1 tablet by mouth every 8 (eight) hours as needed for moderate pain.  Marland Kitchen HYDROcodone-acetaminophen (NORCO) 10-325 MG tablet Take 1 tablet by mouth every 8 (eight) hours as needed for moderate pain.  Marland Kitchen  insulin aspart (NOVOLOG) 100 UNIT/ML FlexPen Inject 40 Units into the skin 3 (three) times daily with meals.  . insulin glargine (LANTUS SOLOSTAR) 100 UNIT/ML Solostar Pen Inject 100 Units into the skin 2 (two) times daily.  . Insulin Pen Needle (SURE COMFORT PEN NEEDLES) 32G X 4 MM  MISC Use to inject insulin as indicated  . ipratropium-albuterol (DUONEB) 0.5-2.5 (3) MG/3ML SOLN Take 3 mLs by nebulization every 4 (four) hours as needed.  . liraglutide (VICTOZA) 18 MG/3ML SOPN Inject 1.2 mg into the skin daily.  Marland Kitchen loratadine (CLARITIN) 10 MG tablet Take 1 tablet (10 mg total) by mouth daily.  . metoprolol succinate (TOPROL-XL) 25 MG 24 hr tablet Take 25 mg by mouth daily.  . Misc. Devices (TRANSFER BENCH) MISC Use daily  . omeprazole (PRILOSEC) 40 MG capsule Take 1 capsule (40 mg total) by mouth daily.  . ondansetron (ZOFRAN ODT) 4 MG disintegrating tablet Take 1 tablet (4 mg total) by mouth every 8 (eight) hours as needed for nausea.  . potassium chloride SA (KLOR-CON M20) 20 MEQ tablet Take 1 tablet (20 mEq total) by mouth daily.  Marland Kitchen rOPINIRole (REQUIP) 1 MG tablet Take 1 tablet (1 mg total) by mouth at bedtime.  . Tiotropium Bromide Monohydrate (SPIRIVA RESPIMAT) 1.25 MCG/ACT AERS Inhale 2 puffs into the lungs daily.  Marland Kitchen torsemide (DEMADEX) 20 MG tablet TAKE 4 TABLETS (80 MG) BY MOUTH ONCE A DAY   No facility-administered encounter medications on file as of 03/24/2021.    Patient Active Problem List   Diagnosis Date Noted  . Urinary incontinence 10/23/2020  . Elevated troponin   . Acute respiratory failure with hypoxia and hypercarbia ([place]) 06/15/2020  . Chronic respiratory failure with hypoxia ([place]) 06/14/2020  . Blister of foot 04/21/2020  . Exposure to mold 04/21/2020  . Constipation 04/03/2020  . Left shoulder pain 06/23/2019  . Hyperglycemia due to diabetes mellitus ([place])   . 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[place]) 04/11/2019  . Allergic rhinitis 03/06/2018  . Class 3 severe obesity due to excess calories with serious comorbidity and body mass index (BMI) of 50.0 to 59.9 in adult [facility])   . Decreased pedal pulses 06/10/2017  . Unilateral primary osteoarthritis, right knee 10/22/2016  . Primary osteoarthritis of first carpometacarpal joint of left hand 07/30/2016  .  Diabetic neuropathy ([place]) 07/14/2016  . Nausea 03/17/2016  . De Quervain's tenosynovitis, bilateral 11/01/2015  . Vitamin D deficiency 09/05/2015  . Recurrent candidiasis of vagina 09/05/2015  . Varicose veins of leg with complications 35/57/3220  . Restless leg syndrome 10/17/2014  . Chronic sinusitis 07/18/2014  . Encounter for chronic pain management 06/30/2013  . HLD (hyperlipidemia) 11/19/2012  . Angina pectoris ([place]) 06/27/2012  . Abscess of skin and subcutaneous tissue 11/04/2011  . Right carpal tunnel syndrome 09/01/2011  . Bilateral knee pain 09/01/2011  . DM (diabetes mellitus) type II uncontrolled, periph vascular disorder ([place]) 05/22/2008  . Morbid obesity ([place]) 05/22/2008  . Obesity hypoventilation syndrome ([place]) 05/22/2008  . Mood disorder ([place]) 05/22/2008  . Obstructive sleep apnea 05/22/2008  . Hypertension 05/22/2008  . Asthma 05/22/2008  . GERD 05/22/2008    Conditions to be addressed/monitored: Depression; Mental Health Concerns   Care Plan : Clincial Social Work  Updates made by Maurine Cane, LCSW since 03/24/2021 12:00 AM    Problem: Symptoms of Depression Identification     Long-Range Goal: Manage symptoms of depression and start counseling   Start Date: 02/19/2021  Recent Progress: On track  Priority: High  Note:   Current barriers:   . Patient missed counseling appointment today . Chronic Mental Health needs related to symptoms of depression  Currently unable to independently self manage needs related to mental health conditions. Leodis [place] knowledge of where and how to connect for counseling  Clinical Goal(s): Over the next 30 days, patient will work with SW to reduce or manage symptoms of depression and stress until connected for ongoing counseling.  Clinical Interventions:  . Assessed patient's needs, coping skills,support system and barriers to care . Called [facility] with patient on the phone; per agency patient missed appointment / per  patient agency did not call her . Collaborated with [facility] to resolve issues; and miss communication with appointment  . New information sent to patient by [facility] and new appointment scheduled for patient May 12th  . Other interventions include: Solution-Focused Strategies and Emotional Supportive  . Collaboration with PCP regarding development and update of comprehensive plan of care as evidenced by provider attestation and co-signature . Inter-disciplinary care team collaboration (see longitudinal plan of care) Patient Goals/Self-Care Activities: Over the next 30 days . Complete papers for [facility] . Call to confirm that they received the needed paperwork . Your appointment is May 12th      Casimer Lanius, [place] / [place]   [phone] 4:34 PM

## 2021-03-24 NOTE — Patient Instructions (Signed)
  [name]  it was nice speaking with you. Please call me directly if you have questions about the goals we discussed. Goals Addressed            This Visit's Progress   . Begin and Stick with Counseling-Depression   On track    Timeframe:  Long-Range Goal Priority:  High Start Date:  02/19/21                           Expected End Date:  05/04/21                      Patient Goals/Self-Care Activities:  . Complete papers for [facility] . Call to confirm that they received the needed paperwork . Your appointment is May 12th with [facility]    Why is this important?    Beating depression may take some time.   If you don't feel better right away, don't give up on your treatment plan.       [name] received Care Coordination services today:  1. Care Coordination services include personalized support from designated clinical staff supervised by her physician, including individualized plan of care and coordination with other care providers 2. 24/7 contact [phone] for assistance for urgent and routine care needs. 3. Care Coordination are voluntary services and be declined at any time by calling the office.  Patient verbalizes understanding of instructions provided today.    Follow up plan: Appointment scheduled for SW follow up with client by phone on: 03/31/21  Maurine Cane, [place]

## 2021-03-25 ENCOUNTER — Ambulatory Visit (INDEPENDENT_AMBULATORY_CARE_PROVIDER_SITE_OTHER): Payer: Medicaid Other | Admitting: Pharmacist

## 2021-03-25 ENCOUNTER — Other Ambulatory Visit: Payer: Self-pay

## 2021-03-25 ENCOUNTER — Encounter: Payer: Self-pay | Admitting: Pharmacist

## 2021-03-25 DIAGNOSIS — E1151 Type 2 diabetes mellitus with diabetic peripheral angiopathy without gangrene: Secondary | ICD-10-CM

## 2021-03-25 DIAGNOSIS — E1165 Type 2 diabetes mellitus with hyperglycemia: Secondary | ICD-10-CM | POA: Diagnosis not present

## 2021-03-25 DIAGNOSIS — IMO0002 Reserved for concepts with insufficient information to code with codable children: Secondary | ICD-10-CM

## 2021-03-25 MED ORDER — TRULICITY 1.5 MG/0.5ML ~~LOC~~ SOAJ: 1.5000 mg | SUBCUTANEOUS | 1 refills | Status: DC

## 2021-03-25 MED ORDER — TRESIBA FLEXTOUCH 200 UNIT/ML ~~LOC~~ SOPN: 110.0000 [IU] | PEN_INJECTOR | Freq: Two times a day (BID) | SUBCUTANEOUS | 2 refills | Status: DC

## 2021-03-25 NOTE — Patient Instructions (Addendum)
[name] it was a pleasure seeing you today.   Please do the following:  1. Increase Lantus by 10 units in the morning and in the evening (110 units) as directed today during your appointment. If you have any questions or if you believe something has occurred because of this change, call me or your doctor to let one of [place] know. I am going to send in prescription for Tresiba to see if Medicaid will cover it. In the meantime continue to take Lantus. 2. Increase Novolog by 5 units with each meal (45 units) as directed today during your appointment. If you have any questions or if you believe something has occurred because of this change, call me or your doctor to let one of [place] know. 3. Increase Trulicity to 1.5mg  once weekly as directed today during your appointment. If you have any questions or if you believe something has occurred because of this change, call me or your doctor to let one of [place] know. 4. Continue checking blood sugars at home. It's really important that you record these and bring these in to your next doctor's appointment.  5. Continue making the lifestyle changes we've discussed together during our visit. Diet and exercise play a significant role in improving your blood sugars.  6. Follow-up with me in three weeks.  Hypoglycemia or low blood sugar:   Low blood sugar can happen quickly and may become an emergency if not treated right away.   While this shouldn't happen often, it can be brought upon if you skip a meal or do not eat enough. Also, if your insulin or other diabetes medications are dosed too high, this can cause your blood sugar to go to low.   Warning signs of low blood sugar include: 1. Feeling shaky or dizzy 2. Feeling weak or tired  3. Excessive hunger 4. Feeling anxious or upset  5. Sweating even when you aren't exercising  What to do if I experience low blood sugar? Follow the Rule of 15 1. Check your blood sugar with your meter. If lower than 70, proceed to  step 2.  2. Treat with 15 grams of fast acting carbs which is found in 3-4 glucose tablets. If none are available you can try hard candy, 1 tablespoon of sugar or honey,4 ounces of fruit juice, or 6 ounces of REGULAR soda.  3. Re-check your sugar in 15 minutes. If it is still below 70, do what you did in step 2 again. If your blood sugar has come back up, go ahead and eat a snack or small meal made up of complex carbs (ex. Whole grains) and protein at this time to avoid recurrence of low blood sugar.

## 2021-03-25 NOTE — Progress Notes (Signed)
Subjective:    Patient ID: [name], female    DOB: [birth year], 45 y.o.   MRN: [id number]  HPI Patient is a 45 y.o. female who presents for diabetes management. She is in good spirits and presents with assistance in wheelchair alongside daughter and friend's son. Patient was referred and last seen by Primary Care Provider on 02/10/21.  Patient reports diabetes was diagnosed in 2001.   Insurance coverage/medication affordability: Medicaid  Family/Social history: daughter lives in household  Current diabetes medications include: Trulicity 0.75mg  once weekly, insulin aspart (Novolog) 40 units TID with meals, insulin glargine (Lantus) 100 units BID Current hypertension medications include: metoprolol 25mg , torsemide 80mg  Current hyperlipidemia medications include: atorvastatin 80mg  Patient states that She is taking her diabetes medications as prescribed. Patient reports adherence with medications. Patient states that she does not miss doses of her medications  Does you feel that your medications are working for you?  "sometimes"  Have you been experiencing any side effects to the medications prescribed? no  Do you have any problems obtaining medications due to transportation or finances?  "sometimes, more so finances because my pharmacy delivers"    Patient reported dietary habits:  Eats 3 meals/day and 0 snacks/day; Boluses with 3 meals/day  Breakfast: boiled eggs or bagel Lunch: homemade soup or leftovers from dinner Dinner: protein, veggie, occassionally starch Snacks: N/A Drinks: alkaline water; occasionally regular soda but trying to drink sprite zero  Patient-reported exercise habits: N/A   Patient denies hypoglycemic events. Patient reports polyuria (increased urination).  Patient denies polyphagia (increased appetite).  Patient reports polydipsia (increased thirst).  Patient reports neuropathy (nerve pain). Patient reports visual changes. Patient reports self foot exams.      Objective:   Labs:   Lab Results  Component Value Date   HGBA1C >15.5 (H) 10/09/2020   HGBA1C 12.7 (H) 06/14/2020   HGBA1C >15.0 04/03/2020    Lab Results  Component Value Date   MICRALBCREAT 728.0 (H) 01/24/2015    Lipid Panel     Component Value Date/Time   CHOL 330 (H) 02/10/2021 1635   TRIG 486 (H) 02/10/2021 1635   HDL 37 (L) 02/10/2021 1635   CHOLHDL 8.9 (H) 02/10/2021 1635   CHOLHDL 6.3 (H) 01/11/2017 0909   VLDL 52 (H) 01/11/2017 0909   LDLCALC 192 (H) 02/10/2021 1635   LDLDIRECT 164.9 11/15/2014 1148    Clinical Atherosclerotic Cardiovascular Disease (ASCVD): Yes  The ASCVD Risk score Mikey Bussing DC Jr., et al., 2013) failed to calculate for the following reasons:   The valid total cholesterol range is 130 to 320 mg/dL   PHQ-9 Score: did not complete  Assessment/Plan:   T2DM is not controlled likely due to insulin resistance based on continuously elevated blood glucose despite relatively well-balanced diet. Medication adherence appears optimal. Additional pharmacotherapy is not warranted as patient can be titrated on GLP1 as she has completed 4 doses. Patient with a history of intolerance to metformin and victoza. In addition to titration of GLP1, patient will need increase in insulin. Will increase basal and bolus insulin by 10% each. Tyler Aas is not on Medicaid preferred list but will attempt to send in prescription for Tresiba 200 units/mL to initiate PA process as patient is good candidate due to having to inject Lantus 2x for single dose of 100 units whereas Tresiba 200 units/mL can be dosed just once up to a max of 160 units. If approved will switch patient from Lantus to [place], otherwise continue on Lantus. Following instruction patient verbalized understanding  of treatment plan.    1. Increased dose of basal insulin glargine (Lantus) to 110 units BID.  2. Increased dose of  rapid insulin aspart (Novolog) to 45 units TID with meals. 3. Increased dose of  GLP-1 dulaglutide (Trulicity) to 1.5mg  once weekly 4. Extensively discussed pathophysiology of diabetes, dietary effects on blood sugar control, and recommended lifestyle interventions,  5. Counseled on s/sx of and management of hypoglycemia 6. Next A1C anticipated at visit with PCP on 03/31/21.  ASCVD risk - secondary prevention in patient with diabetes. Last LDL is not controlled. ASCVD risk score unable to be calculated. Patient currently taking high intensity statin + aspirin.  1. Recommend addition of ezetimibe 10mg   at future follow-up appt  Follow-up appointment in three weeks to review sugar readings. Written patient instructions provided.  This appointment required 75 minutes of patient care (this includes precharting, chart review, review of results, and face-to-face care).  Thank you for involving pharmacy to assist in providing this patient's care.

## 2021-03-26 ENCOUNTER — Other Ambulatory Visit: Payer: Self-pay | Admitting: Family Medicine

## 2021-03-27 ENCOUNTER — Encounter (HOSPITAL_BASED_OUTPATIENT_CLINIC_OR_DEPARTMENT_OTHER): Payer: Medicaid Other | Admitting: Internal Medicine

## 2021-03-27 ENCOUNTER — Other Ambulatory Visit: Payer: Self-pay

## 2021-03-27 ENCOUNTER — Encounter (HOSPITAL_BASED_OUTPATIENT_CLINIC_OR_DEPARTMENT_OTHER): Payer: Self-pay

## 2021-03-30 ENCOUNTER — Emergency Department (HOSPITAL_COMMUNITY): Payer: Medicaid Other

## 2021-03-30 ENCOUNTER — Other Ambulatory Visit: Payer: Self-pay

## 2021-03-30 ENCOUNTER — Telehealth: Payer: Self-pay | Admitting: Pharmacist

## 2021-03-30 ENCOUNTER — Encounter (HOSPITAL_COMMUNITY): Payer: Self-pay

## 2021-03-30 ENCOUNTER — Emergency Department (HOSPITAL_COMMUNITY)
Admission: EM | Admit: 2021-03-30 | Discharge: 2021-03-30 | Disposition: A | Discharge status: Home / Self Care | Payer: Medicaid Other | Attending: Emergency Medicine | Admitting: Emergency Medicine

## 2021-03-30 ENCOUNTER — Encounter: Payer: Self-pay | Admitting: Family Medicine

## 2021-03-30 DIAGNOSIS — M1711 Unilateral primary osteoarthritis, right knee: Secondary | ICD-10-CM | POA: Insufficient documentation

## 2021-03-30 DIAGNOSIS — M25561 Pain in right knee: Secondary | ICD-10-CM | POA: Diagnosis present

## 2021-03-30 DIAGNOSIS — J449 Chronic obstructive pulmonary disease, unspecified: Secondary | ICD-10-CM | POA: Insufficient documentation

## 2021-03-30 DIAGNOSIS — Z79899 Other long term (current) drug therapy: Secondary | ICD-10-CM | POA: Diagnosis not present

## 2021-03-30 DIAGNOSIS — Z87891 Personal history of nicotine dependence: Secondary | ICD-10-CM | POA: Diagnosis not present

## 2021-03-30 DIAGNOSIS — Z794 Long term (current) use of insulin: Secondary | ICD-10-CM | POA: Diagnosis not present

## 2021-03-30 DIAGNOSIS — Z89512 Acquired absence of left leg below knee: Secondary | ICD-10-CM | POA: Diagnosis not present

## 2021-03-30 DIAGNOSIS — M25562 Pain in left knee: Secondary | ICD-10-CM | POA: Insufficient documentation

## 2021-03-30 DIAGNOSIS — E1142 Type 2 diabetes mellitus with diabetic polyneuropathy: Secondary | ICD-10-CM | POA: Insufficient documentation

## 2021-03-30 DIAGNOSIS — J45909 Unspecified asthma, uncomplicated: Secondary | ICD-10-CM | POA: Diagnosis not present

## 2021-03-30 DIAGNOSIS — Z7982 Long term (current) use of aspirin: Secondary | ICD-10-CM | POA: Insufficient documentation

## 2021-03-30 DIAGNOSIS — I10 Essential (primary) hypertension: Secondary | ICD-10-CM | POA: Diagnosis not present

## 2021-03-30 MED ORDER — OXYCODONE-ACETAMINOPHEN 5-325 MG PO TABS
1.0000 | ORAL_TABLET | Freq: Once | ORAL | Status: AC
  Administered 2021-03-30: 1 via ORAL
  Filled 2021-03-30: qty 1

## 2021-03-30 NOTE — ED Notes (Signed)
[facility] called for transport.  

## 2021-03-30 NOTE — ED Provider Notes (Signed)
[facility] DEPT Provider Note   CSN: [id number] Arrival date & time: 03/30/21  0121     History Chief Complaint  Patient presents with  . Fall    [name] is a 45 y.o. female with PMH/o COPD, DM, Drug seeking behavior, HTN, BIB EMS for evaluation of bilateral knee pain, right > left after a mechanical fall that occurred at 12 pm this afternoon.  Patient reports she was walking and felt like her right knee gave out causing her to fall. She reports she landed on her knees. No CP or dizziness preceding the fall. She reports she ambulates with a walker. She reports pain with ambulating since this happened. She denies any numbness/weakkness.   The history is provided by the patient.       Past Medical History:  Diagnosis Date  . Acute osteomyelitis of ankle or foot, left ([place]) 01/31/2018  . Alveolar hypoventilation   . Anemia    not on iron pill  . Asthma   . Bipolar 2 disorder ([place])   . Carpal tunnel syndrome on right    recurrent  . Cellulitis 08/2010-08/2011  . Chronic pain   . COPD (chronic obstructive pulmonary disease) ([name])    Symbicort daily and Proventil as needed  . Costochondritis   . Diabetes mellitus type II, uncontrolled ([place]) 2000   Type 2, Uncontrolled.Takes Lantus daily.Fasting blood sugar runs 150  . Drug-seeking behavior   . GERD (gastroesophageal reflux disease)    takes Pantoprazole and Zantac daily  . HLD (hyperlipidemia)    takes Atorvastatin daily  . Hypertension    takes Lisinopril and Coreg daily  . Morbid obesity ([place])   . Nocturia   . OSA on CPAP   . Peripheral neuropathy    takes Gabapentin daily  . Pneumonia    "walking" several yrs ago and as a baby (12/05/2018)  . Rectal fissure   . Restless leg   . SVT (supraventricular tachycardia) ([place])   . Syncope 02/25/2016  . Urinary frequency   . Urinary incontinence 10/23/2020  . Varicose veins    Right medial thigh and Left leg     Patient Active Problem  List   Diagnosis Date Noted  . Urinary incontinence 10/23/2020  . Elevated troponin   . Acute respiratory failure with hypoxia and hypercarbia ([place]) 06/15/2020  . Chronic respiratory failure with hypoxia ([place]) 06/14/2020  . Blister of foot 04/21/2020  . Exposure to mold 04/21/2020  . Constipation 04/03/2020  . Left shoulder pain 06/23/2019  . Hyperglycemia due to diabetes mellitus ([place])   . 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[place]) 04/11/2019  . Allergic rhinitis 03/06/2018  . Class 3 severe obesity due to excess calories with serious comorbidity and body mass index (BMI) of 50.0 to 59.9 in adult [facility])   . Decreased pedal pulses 06/10/2017  . Unilateral primary osteoarthritis, right knee 10/22/2016  . Primary osteoarthritis of first carpometacarpal joint of left hand 07/30/2016  . Diabetic neuropathy ([place]) 07/14/2016  . Nausea 03/17/2016  . De Quervain's tenosynovitis, bilateral 11/01/2015  . Vitamin D deficiency 09/05/2015  . Recurrent candidiasis of vagina 09/05/2015  . Varicose veins of leg with complications 40/98/1191  . Restless leg syndrome 10/17/2014  . Chronic sinusitis 07/18/2014  . Encounter for chronic pain management 06/30/2013  . HLD (hyperlipidemia) 11/19/2012  . Angina pectoris ([place]) 06/27/2012  . Abscess of skin and subcutaneous tissue 11/04/2011  . Right carpal tunnel syndrome 09/01/2011  . Bilateral knee pain 09/01/2011  . DM (  diabetes mellitus) type II uncontrolled, periph vascular disorder ([place]) 05/22/2008  . Morbid obesity ([place]) 05/22/2008  . Obesity hypoventilation syndrome ([place]) 05/22/2008  . Mood disorder ([place]) 05/22/2008  . Obstructive sleep apnea 05/22/2008  . Hypertension 05/22/2008  . Asthma 05/22/2008  . GERD 05/22/2008    Past Surgical History:  Procedure Laterality Date  . AMPUTATION Left 02/01/2018   Procedure: LEFT FOURTH AND 5TH TOE RAY AMPUTATION;  Surgeon: [name], MD;  Location: [facility];  Service: Orthopedics;  Laterality: Left;  . AMPUTATION  Left 03/03/2018   Procedure: LEFT BELOW KNEE AMPUTATION;  Surgeon: [name], MD;  Location: [facility];  Service: Orthopedics;  Laterality: Left;  . CARPAL TUNNEL RELEASE Bilateral   . CESAREAN SECTION  2007  . CORONARY ANGIOGRAPHY N/A 11/26/2020   Procedure: CORONARY ANGIOGRAPHY;  Surgeon: Sherren Mocha, MD;  Location: [facility];  Service: Cardiovascular;  Laterality: N/A;  . INCISION AND DRAINAGE PERIRECTAL ABSCESS Left 05/18/2019   Procedure: IRRIGATION AND DEBRIDEMENT OF PANNIS ABSCESS, POSSIBLE DEBRIDEMENT OF BUTTOCK WOUND;  Surgeon: Donnie Mesa, MD;  Location: [facility];  Service: General;  Laterality: Left;  . IRRIGATION AND DEBRIDEMENT BUTTOCKS Left 05/17/2019   Procedure: IRRIGATION AND DEBRIDEMENT BUTTOCKS;  Surgeon: Donnie Mesa, MD;  Location: [facility];  Service: General;  Laterality: Left;  . KNEE ARTHROSCOPY Right 07/17/2010  . LEFT HEART CATHETERIZATION WITH CORONARY ANGIOGRAM N/A 07/27/2012   Procedure: LEFT HEART CATHETERIZATION WITH CORONARY ANGIOGRAM;  Surgeon: Sherren Mocha, MD;  Location: [facility] CATH LAB;  Service: Cardiovascular;  Laterality: N/A;  . MASS EXCISION N/A 06/29/2013   Procedure:  WIDE LOCAL EXCISION OF POSTERIOR NECK ABSCESS;  Surgeon: Ralene Ok, MD;  Location: [facility];  Service: General;  Laterality: N/A;  . REPAIR KNEE LIGAMENT Left    "fixed ligaments and chipped patella"  . right transmetatarsal amputation        OB History    Gravida  2   Para  1   Term      Preterm      AB  1   Living  1     SAB  1   IAB      Ectopic      Multiple      Live Births              Family History  Problem Relation Age of Onset  . Diabetes Mother   . Hyperlipidemia Mother   . Depression Mother   . GER disease Mother   . Allergic rhinitis Mother   . Restless legs syndrome Mother   . Heart attack Paternal Uncle   . Heart disease Paternal Grandmother   . Heart attack Paternal Grandmother   . Heart attack Paternal Grandfather   . Heart  disease Paternal Grandfather   . Heart attack Father   . Migraines Sister   . Cancer Maternal Grandmother        COLON  . Hypertension Maternal Grandmother   . Hyperlipidemia Maternal Grandmother   . Diabetes Maternal Grandmother   . Other Maternal Grandfather        GUN SHOT  . Anxiety disorder Sister   . Asthma Child     Social History   Tobacco Use  . Smoking status: Former Smoker    Packs/day: 0.25    Years: 15.00    Pack years: 3.75    Types: Cigarettes    Quit date: 12/06/2005    Years since quitting: 15.3  . Smokeless tobacco:  Never Used  . Tobacco comment: smokes for a couple of months  Vaping Use  . Vaping Use: Never used  Substance Use Topics  . Alcohol use: No    Alcohol/week: 0.0 standard drinks  . Drug use: Not Currently    Comment: OD attempts on home meds      Home Medications Prior to Admission medications   Medication Sig Start Date End Date Taking? Authorizing Provider  Accu-Chek FastClix Lancets MISC USE DAILY AS INSTRUCTED 03/26/21   Martyn Malay, MD  ACCU-CHEK GUIDE test strip USE T0 CHECK BLOOD SUGAR 3 TIMES DAILY 01/08/21   [name], MD  Accu-Chek Softclix Lancets lancets Use to check blood glucose four times daily 04/17/20   Martyn Malay, MD  aspirin EC 81 MG tablet Take 1 tablet (81 mg total) by mouth daily. Swallow whole. 11/19/20   Sherren Mocha, MD  atorvastatin (LIPITOR) 80 MG tablet Take 1 tablet (80 mg total) by mouth daily. 02/11/21   [name], MD  budesonide-formoterol [facility]) 160-4.5 MCG/ACT inhaler Inhale 2 puffs into the lungs 2 (two) times daily. 05/09/20   [name], MD  Cholecalciferol 1000 units tablet Take 1 tablet (1,000 Units total) by mouth daily. 04/05/17   [name], MD  Compression Bandages MISC Wear daily for swelling 07/10/20   [name], MD  Continuous Blood Gluc Sensor (DEXCOM G6 SENSOR) MISC Inject 1 applicator into the skin as directed. Change sensor every 10 days.  10/16/20   Leavy Cella, RPH-CPP  Continuous Blood Gluc Transmit (DEXCOM G6 TRANSMITTER) MISC Inject 1 Device into the skin as directed. Reuse 8 times with sensor changes. 10/16/20   Leavy Cella, RPH-CPP  Dulaglutide (TRULICITY) 1.5 0000000 SOPN Inject 1.5 mg into the skin once a week. 03/25/21   [name], MD  gabapentin (NEURONTIN) 600 MG tablet TAKE 2 TABLETS BY MOUTH 3 TIMES DAILY 03/23/21   [name], MD  HYDROcodone-acetaminophen [facility]) 10-325 MG tablet Take 1 tablet by mouth every 8 (eight) hours as needed for moderate pain. 01/25/21   [name], MD  insulin aspart (NOVOLOG) 100 UNIT/ML FlexPen Inject 40 Units into the skin 3 (three) times daily with meals. 02/10/21   [name], MD  insulin degludec (TRESIBA FLEXTOUCH) 200 UNIT/ML FlexTouch Pen Inject 110 Units into the skin 2 (two) times daily. 03/25/21   [name], MD  insulin glargine (LANTUS SOLOSTAR) 100 UNIT/ML Solostar Pen Inject 100 Units into the skin 2 (two) times daily. 02/10/21   [name], MD  Insulin Pen Needle (SURE COMFORT PEN NEEDLES) 32G X 4 MM MISC Use to inject insulin as indicated 04/17/20   Martyn Malay, MD  ipratropium-albuterol (DUONEB) 0.5-2.5 (3) MG/3ML SOLN Take 3 mLs by nebulization every 4 (four) hours as needed. Patient not taking: Reported on 03/25/2021 06/25/20   [name], MD  loratadine (CLARITIN) 10 MG tablet Take 1 tablet (10 mg total) by mouth daily. Patient not taking: Reported on 03/25/2021 11/14/20   Martyn Ehrich, NP  metoprolol succinate (TOPROL-XL) 25 MG 24 hr tablet Take 25 mg by mouth daily. 10/23/20   [provider]  Misc. Devices (TRANSFER BENCH) MISC Use daily 10/29/20   [name], MD  omeprazole (PRILOSEC) 40 MG capsule Take 1 capsule (40 mg total) by mouth daily. 11/14/20   Martyn Ehrich, NP  ondansetron (ZOFRAN ODT) 4 MG disintegrating tablet Take 1 tablet (4 mg total) by  mouth every 8  (eight) hours as needed for nausea. Patient not taking: Reported on 03/25/2021 02/10/21   [name], MD  potassium chloride SA (KLOR-CON M20) 20 MEQ tablet Take 1 tablet (20 mEq total) by mouth daily. 09/09/20   Richardson Dopp T, PA-C  rOPINIRole (REQUIP) 1 MG tablet Take 1 tablet (1 mg total) by mouth at bedtime. 08/12/20   [name], MD  Tiotropium Bromide Monohydrate (SPIRIVA RESPIMAT) 1.25 MCG/ACT AERS Inhale 2 puffs into the lungs daily. 11/14/20   Martyn Ehrich, NP  torsemide (DEMADEX) 20 MG tablet TAKE 4 TABLETS (80 MG) BY MOUTH ONCE A DAY 02/27/21   Lind Covert, MD    Allergies    Cefepime, Kiwi extract, Morphine and related, Trental [pentoxifylline], and Nubain [nalbuphine hcl]  Review of Systems   Review of Systems  Musculoskeletal:       Right knee pain Left knee pain  Neurological: Negative for weakness and numbness.  All other systems reviewed and are negative.   Physical Exam Updated Vital Signs BP (!) 146/91 (BP Location: Right Arm)   Pulse 99   Temp 98.8 F (37.1 C) (Oral)   Resp (!) 24   SpO2 96%   Physical Exam Vitals and nursing note reviewed.  Constitutional:      Appearance: She is well-developed.  HENT:     Head: Normocephalic and atraumatic.  Eyes:     General: No scleral icterus.       Right eye: No discharge.        Left eye: No discharge.     Conjunctiva/sclera: Conjunctivae normal.  Cardiovascular:     Pulses:          Popliteal pulses are 2+ on the right side.     Comments: Left BKA Pulmonary:     Effort: Pulmonary effort is normal.  Musculoskeletal:     Comments: Left BKA noted.  Tenderness palpation noted to anterior aspect of the left knee.  No deformity or crepitus noted.  Tenderness palpation in anterior right knee.  No deformity or crepitus noted. Flexion /tension intact but does report some pain.  No deformity or crepitus noted.  Skin:    General: Skin is warm and dry.  Neurological:     Mental Status:  She is alert.  Psychiatric:        Speech: Speech normal.        Behavior: Behavior normal.     ED Results / Procedures / Treatments   Labs (all labs ordered are listed, but only abnormal results are displayed) Labs Reviewed - No data to display  EKG None  Radiology DG Knee Complete 4 Views Left  Result Date: 03/30/2021 CLINICAL DATA:  Left knee pain after fall EXAM: LEFT KNEE - COMPLETE 4+ VIEW COMPARISON:  None. FINDINGS: Surgical anchor in the lateral left tibia. Status post below-knee amputation. No acute fracture. IMPRESSION: No acute fracture or dislocation of the left knee. Status post below-knee amputation. Electronically Signed   By: Ulyses Jarred M.D.   On: 03/30/2021 03:33   DG Knee Complete 4 Views Right  Result Date: 03/30/2021 CLINICAL DATA:  Knee pain, fall EXAM: RIGHT KNEE - COMPLETE 4+ VIEW COMPARISON:  None. FINDINGS: No acute fracture or dislocation. There is mild osteoarthrosis. Soft tissue density anteriorly likely due to venous stasis. Atherosclerotic calcification. No joint effusion. IMPRESSION: No acute fracture or dislocation of the right knee. Mild osteoarthrosis. Electronically Signed   By: Ulyses Jarred M.D.   On:  03/30/2021 03:19    Procedures Procedures   Medications Ordered in ED Medications  oxyCODONE-acetaminophen (PERCOCET/ROXICET) 5-325 MG per tablet 1 tablet (1 tablet Oral Given 03/30/21 0233)  oxyCODONE-acetaminophen (PERCOCET/ROXICET) 5-325 MG per tablet 1 tablet (1 tablet Oral Given 03/30/21 0505)    ED Course  I have reviewed the triage vital signs and the nursing notes.  Pertinent labs & imaging results that were available during my care of the patient were reviewed by me and considered in my medical decision making (see chart for details).    MDM Rules/Calculators/A&P                          45 year old female brought in by EMS for evaluation of bilateral knee pain, right greater than left after a mechanical fall that occurred at 12  PM.  History of left BKA.  Feels like her right knee gave out which caused her to fall.  She reports she ambulates with the assistance of a walker.  Reports pain with ambulation, bearing weight.  On initial arrival, she is afebrile, nontoxic-appearing.  Vital signs are stable.  She has good distal PT pulses in the right lower extremity.  She has a left BKA.  We will plan for x-ray imaging, analgesics here in the ED.  Review of PMP shows that patient had 90 hydrocodone prescribed at 01/25/21.   X-ray showed no acute fracture or dislocation of the right knee.  There is mild osteoarthritis.  X-ray shows no acute fracture or dislocation of the left knee.  Discussed results with patient.  I discussed with her that there could still be a musculoskeletal/ligamentous injury.  Particularly, we discussed the possibility of there being any sprain.  He is without significant swelling of the knee.  No obvious joint effusion noted on x-ray.  No indication for further imaging here in the ED.  We will plan to put her in a knee sleeve and have her follow-up with orthopedics.  At this time, given her work-up in the ED, do not feel that acute narcotic pain medication is warranted. At this time, patient exhibits no emergent life-threatening condition that require further evaluation in ED. Patient had ample opportunity for questions and discussion. All patient's questions were answered with full understanding. Strict return precautions discussed. Patient expresses understanding and agreement to plan.   Portions of this note were generated with Lobbyist. Dictation errors may occur despite best attempts at proofreading.   Final Clinical Impression(s) / ED Diagnoses Final diagnoses:  Acute pain of both knees    Rx / DC Orders ED Discharge Orders    None       Desma Mcgregor 03/30/21 0544    Palumbo, April, MD 03/30/21 [phone]

## 2021-03-30 NOTE — Telephone Encounter (Signed)
Called patient to discuss message regarding low blood glucose sent this morning.   Was waiting for approval/denial from Medicaid regarding Tresiba, and patient states it was approved and she picked it up yesterday. Patient states she began dose of Tresiba yesterday, and took 110 units in the evening and had a hypoglycemic event starting at 4 AM when she awoke and found her blood glucose to be 79. She did not immediately take anything and her blood glucose continued to dip into the 40's and at that point she took some candy and her blood glucose levels raised. She reports her blood glucose this morning now being 387.   From our visit on 03/25/21 instructed patient to increase her Lantus from 100 units BID to 110 units BID (10% increase) and her Novolog from 40 units TID to 45 units TID (11% increase) due to her blood glucose being consistently >350 and her reporting no missed doses of medications.   Since our visit and prior to switching to [place] patient reports her blood glucose readings continued to be in 300-400 range. Switching from Lantus to [place] I would not anticipate such a rapid decrease in blood glucose levels. Due to the significant decrease in blood glucose I believe her Lantus may not have been as effective (ex. Lantus accidentally freezing and then thawing or being left in high heat enviroment). In the meantime will decrease patient's dose of Tresiba to 90 units BID (18% decrease) and will call patient in two days. Patient verbalized proper hypoglycemia management. Instructed patient to call and speak with me tomorrow if she has another episode of hypoglycemia before Wednesday. Also instructed her to decrease her dose of Novolog back to 40 units TID if she experiences further hypoglycemic events before we discuss her readings in two days.

## 2021-03-30 NOTE — Telephone Encounter (Signed)
Agree with decreasing tresiba as planned. Patient also has an appointment to see me tomorrow AM  [name], MD

## 2021-03-30 NOTE — ED Triage Notes (Signed)
Patient arrived with complaints of bilateral knee pain after a fall earlier today around noon. No LOC.

## 2021-03-30 NOTE — Discharge Instructions (Addendum)
Follow-up with your orthopedic doctor.  Follow-up with your primary care doctor.  Return to emergency department for any worsening pain, redness or swelling or any other worsening concerning symptoms.

## 2021-03-31 ENCOUNTER — Ambulatory Visit (INDEPENDENT_AMBULATORY_CARE_PROVIDER_SITE_OTHER): Payer: Medicaid Other | Admitting: Family Medicine

## 2021-03-31 ENCOUNTER — Encounter: Payer: Self-pay | Admitting: Family Medicine

## 2021-03-31 ENCOUNTER — Ambulatory Visit: Payer: Medicaid Other | Admitting: Licensed Clinical Social Worker

## 2021-03-31 ENCOUNTER — Telehealth: Payer: Self-pay

## 2021-03-31 VITALS — BP 110/43 | HR 102 | Wt 365.8 lb

## 2021-03-31 DIAGNOSIS — E1151 Type 2 diabetes mellitus with diabetic peripheral angiopathy without gangrene: Secondary | ICD-10-CM | POA: Diagnosis present

## 2021-03-31 DIAGNOSIS — Z7409 Other reduced mobility: Secondary | ICD-10-CM

## 2021-03-31 DIAGNOSIS — E1165 Type 2 diabetes mellitus with hyperglycemia: Secondary | ICD-10-CM

## 2021-03-31 DIAGNOSIS — IMO0002 Reserved for concepts with insufficient information to code with codable children: Secondary | ICD-10-CM

## 2021-03-31 DIAGNOSIS — G8929 Other chronic pain: Secondary | ICD-10-CM | POA: Diagnosis not present

## 2021-03-31 LAB — GLUCOSE, POCT (MANUAL RESULT ENTRY): POC Glucose: 231 mg/dl — AB (ref 70–99)

## 2021-03-31 MED ORDER — HYDROCODONE-ACETAMINOPHEN 10-325 MG PO TABS: 1.0000 | ORAL_TABLET | Freq: Three times a day (TID) | ORAL | 0 refills | Status: DC | PRN

## 2021-03-31 MED ORDER — IRON 325 (65 FE) MG PO TABS: 325.0000 mg | ORAL_TABLET | ORAL | 0 refills | Status: DC

## 2021-03-31 NOTE — Chronic Care Management (AMB) (Signed)
Chronic Care Management    Clinical Social Work Note  03/31/2021 Name: [name] MRN: [id number] DOB: Jan 15, 1976  [name] is a 45 y.o. year old female who is a primary care patient of [name], [name], MD. The CCM team was consulted to assist the patient with chronic disease management and/or care coordination needs related to: Food Insecurity and [place] and Resources.   Engaged with patient by telephone for follow up visit in response to provider referral for social work chronic care management and care coordination services.   Consent to Services:  The patient was given information about Chronic Care Management services, agreed to services, and gave verbal consent prior to initiation of services.  Please see initial visit note for detailed documentation.   Patient agreed to services and consent obtained.   Assessment: Patient continues to experience difficulty with connecting with counseling and food resources... See Care Plan below for interventions and patient self-care actives. Recommendation: Patient may benefit from, and is in agreement to follow up with counseling agency that will provide services to her daughter.  Follow up Plan: Patient would like continued follow-up.  CCM LCSW will follow up with patient in 2 weeks at patient's request.. Patient will call office if needed prior to next encounter.    Review of patient past medical history, allergies, medications, and health status, including review of relevant consultants reports was performed today as part of a comprehensive evaluation and provision of chronic care management and care coordination services.     SDOH (Social Determinants of Health) assessments and interventions performed:    Advanced Directives Status: Not addressed in this encounter.  CCM Care Plan  Allergies  Allergen Reactions  . Cefepime Other (See Comments)    AKI, see records from [facility] hospitalization in January 2020.    Marland Kitchen  Kiwi Extract Shortness Of Breath and Swelling  . Morphine And Related Nausea And Vomiting  . Trental [Pentoxifylline] Nausea And Vomiting  . Nubain [Nalbuphine Hcl] Other (See Comments)    "FEELS LIKE SOMETHING CRAWLING ON ME"    Outpatient Encounter Medications as of 03/31/2021  Medication Sig Note  . Accu-Chek FastClix Lancets MISC USE DAILY AS INSTRUCTED   . ACCU-CHEK GUIDE test strip USE T0 CHECK BLOOD SUGAR 3 TIMES DAILY   . Accu-Chek Softclix Lancets lancets Use to check blood glucose four times daily   . aspirin EC 81 MG tablet Take 1 tablet (81 mg total) by mouth daily. Swallow whole.   Marland Kitchen atorvastatin (LIPITOR) 80 MG tablet Take 1 tablet (80 mg total) by mouth daily.   . budesonide-formoterol (SYMBICORT) 160-4.5 MCG/ACT inhaler Inhale 2 puffs into the lungs 2 (two) times daily.   . Cholecalciferol 1000 units tablet Take 1 tablet (1,000 Units total) by mouth daily.   . Compression Bandages MISC Wear daily for swelling   . Continuous Blood Gluc Sensor (DEXCOM G6 SENSOR) MISC Inject 1 applicator into the skin as directed. Change sensor every 10 days.   . Continuous Blood Gluc Transmit (DEXCOM G6 TRANSMITTER) MISC Inject 1 Device into the skin as directed. Reuse 8 times with sensor changes.   . Dulaglutide (TRULICITY) 1.5 EU/2.3NT SOPN Inject 1.5 mg into the skin once a week.   . Ferrous Sulfate (IRON) 325 (65 Fe) MG TABS Take 1 tablet (325 mg total) by mouth every other day.   . gabapentin (NEURONTIN) 600 MG tablet TAKE 2 TABLETS BY MOUTH 3 TIMES DAILY   . HYDROcodone-acetaminophen (NORCO) 10-325 MG tablet Take 1  tablet by mouth every 8 (eight) hours as needed for moderate pain.   Marland Kitchen HYDROcodone-acetaminophen (NORCO) 10-325 MG tablet Take 1 tablet by mouth every 8 (eight) hours as needed for moderate pain.   Marland Kitchen HYDROcodone-acetaminophen (NORCO) 10-325 MG tablet Take 1 tablet by mouth every 8 (eight) hours as needed for moderate pain.   Marland Kitchen insulin aspart (NOVOLOG) 100 UNIT/ML FlexPen Inject  40 Units into the skin 3 (three) times daily with meals. 03/30/2021: 45 TID  . insulin degludec (TRESIBA FLEXTOUCH) 200 UNIT/ML FlexTouch Pen Inject 110 Units into the skin 2 (two) times daily.   . Insulin Pen Needle (SURE COMFORT PEN NEEDLES) 32G X 4 MM MISC Use to inject insulin as indicated   . ipratropium-albuterol (DUONEB) 0.5-2.5 (3) MG/3ML SOLN Take 3 mLs by nebulization every 4 (four) hours as needed. (Patient not taking: Reported on 03/25/2021)   . loratadine (CLARITIN) 10 MG tablet Take 1 tablet (10 mg total) by mouth daily. (Patient not taking: Reported on 03/25/2021)   . metoprolol succinate (TOPROL-XL) 25 MG 24 hr tablet Take 25 mg by mouth daily.   . Misc. Devices (TRANSFER BENCH) MISC Use daily   . omeprazole (PRILOSEC) 40 MG capsule Take 1 capsule (40 mg total) by mouth daily.   . ondansetron (ZOFRAN ODT) 4 MG disintegrating tablet Take 1 tablet (4 mg total) by mouth every 8 (eight) hours as needed for nausea. (Patient not taking: Reported on 03/25/2021)   . potassium chloride SA (KLOR-CON M20) 20 MEQ tablet Take 1 tablet (20 mEq total) by mouth daily.   Marland Kitchen rOPINIRole (REQUIP) 1 MG tablet Take 1 tablet (1 mg total) by mouth at bedtime.   . Tiotropium Bromide Monohydrate (SPIRIVA RESPIMAT) 1.25 MCG/ACT AERS Inhale 2 puffs into the lungs daily.   Marland Kitchen torsemide (DEMADEX) 20 MG tablet TAKE 4 TABLETS (80 MG) BY MOUTH ONCE A DAY    No facility-administered encounter medications on file as of 03/31/2021.    Patient Active Problem List   Diagnosis Date Noted  . Urinary incontinence 10/23/2020  . Elevated troponin   . Acute respiratory failure with hypoxia and hypercarbia ([place]) 06/15/2020  . Chronic respiratory failure with hypoxia ([place]) 06/14/2020  . Blister of foot 04/21/2020  . Exposure to mold 04/21/2020  . Constipation 04/03/2020  . Left shoulder pain 06/23/2019  . Hyperglycemia due to diabetes mellitus ([place])   . 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[place]) 04/11/2019  . Allergic rhinitis  03/06/2018  . Class 3 severe obesity due to excess calories with serious comorbidity and body mass index (BMI) of 50.0 to 59.9 in adult [facility])   . Decreased pedal pulses 06/10/2017  . Unilateral primary osteoarthritis, right knee 10/22/2016  . Primary osteoarthritis of first carpometacarpal joint of left hand 07/30/2016  . Diabetic neuropathy ([place]) 07/14/2016  . Nausea 03/17/2016  . De Quervain's tenosynovitis, bilateral 11/01/2015  . Vitamin D deficiency 09/05/2015  . Recurrent candidiasis of vagina 09/05/2015  . Varicose veins of leg with complications 09/32/6712  . Restless leg syndrome 10/17/2014  . Chronic sinusitis 07/18/2014  . Encounter for chronic pain management 06/30/2013  . HLD (hyperlipidemia) 11/19/2012  . Angina pectoris ([place]) 06/27/2012  . Abscess of skin and subcutaneous tissue 11/04/2011  . Right carpal tunnel syndrome 09/01/2011  . Bilateral knee pain 09/01/2011  . DM (diabetes mellitus) type II uncontrolled, periph vascular disorder ([place]) 05/22/2008  . Morbid obesity ([place]) 05/22/2008  . Obesity hypoventilation syndrome ([place]) 05/22/2008  . Mood disorder ([place]) 05/22/2008  . Obstructive sleep apnea 05/22/2008  .  Hypertension 05/22/2008  . Asthma 05/22/2008  . GERD 05/22/2008    Conditions to be addressed/monitored: Depression; Limited access to food  Care Plan : Clincial Social Work  Updates made by Maurine Cane, LCSW since 03/31/2021 12:00 AM  Problem: Symptoms of Depression Identification   Long-Range Goal: Manage symptoms of depression and start counseling   Start Date: 02/19/2021  This Visit's Progress: Not on track  Recent Progress: On track  Priority: High  Current barriers:   . Patient missed counseling appointment today . Chronic Mental Health needs related to symptoms of depression  Currently unable to independently self manage needs related to mental health conditions. Leodis [place] knowledge of where and how to connect for counseling  Clinical  Goal(s): Over the next 30 days, patient will work with SW to reduce or manage symptoms of depression and stress until connected for ongoing counseling.  Clinical Interventions:  . Assessed patient's needs, coping skills,support system and barriers to care . Called [facility] with patient on the phone; per agency patient missed appointment / per patient agency did not call her . Collaborated with [facility] to resolve issues; and miss communication with appointment  . New information sent to patient by [facility] and new appointment scheduled for patient May 12th  . Patient reports she continues to have difficulty with getting information to [facility] and would like another counseling agency . She will inquire about services with agency that services her daughter. [address], [place] [address]   [phone]  . Other interventions include: Solution-Focused Strategies and Emotional Supportive  . Collaboration with PCP regarding development and update of comprehensive plan of care as evidenced by provider attestation and co-signature . Inter-disciplinary care team collaboration (see longitudinal plan of care) Patient Goals/Self-Care Activities: Over the next 30 days . Complete papers for [facility] Your appointment is May 12th . Call to confirm that they received the needed paperwork or  . call [facility] to establish care with them if you decide not to go with [facility]   Problem: Food insecurities   Priority: High  Onset Date: 02/19/2021  Current barriers:   . Patient in need of assistance with connecting to community resources for food . Acknowledges deficits with meeting this unmet need . Patient is unable to independently navigate community resource options without care coordination support Clinical Goals: patient will work with food resources to address needs related to running out of food each  month Clinical Interventions:  . Collaboration with [name], MD regarding development and update of comprehensive plan of care as evidenced by provider attestation and co-signature . Inter-disciplinary care team collaboration (see longitudinal plan of care) . Assessment of needs, barriers and progress with connecting with food resources.  . Review various resources, discussed options and provided patient information about [employer] and [employer] Program and [employer] . Advised patient to she is able to do food pick up on Sat when her daughter can go with her. . Patient has not gone to pick up food from resources provided Patient Goals/Self-Care Activities: Over the next 30 days . Do food pick up at [facility] Sat. 9 to [address], [address] [address] / [place]   [phone] 4:51 PM

## 2021-03-31 NOTE — Telephone Encounter (Signed)
Transition Care Management Follow-up Telephone Call  Date of discharge and from where: 03/30/2021 from [place]  How have you been since you were released from the hospital? Pt had appt with PCP today.

## 2021-03-31 NOTE — Patient Instructions (Addendum)
Stop taking lantus Continue tresiba 90u twice daily  Also continue novolog and trulicity  Take iron every other day, see if this helps  Will check on lift chair  Go get xray of your foot Refilled pain medications   Let me know if your sugars are doing better on this regimen  Follow up with me in 1 month, sooner if needed

## 2021-03-31 NOTE — Progress Notes (Signed)
Date of Visit: 03/31/2021   SUBJECTIVE:   HPI:  [name] presents today for routine follow up.  R foot pain - fell 2 days ago, went to ED, had negative knee xrays but primarily pain is currently located in R foot. History of R transmetatarsal amputation. Has wound on R foot for which she has been seeing the wound care center. Reports wound is doing well. Just has pain in foot. Did not have imaging of foot done.  Diabetes - has had low sugars last few days as indicated by her dexcom meter, after starting tresiba over the weekend. On further questioning today, it appears she is taking tresiba and also still taking lantus. Sugar earlier today on dexcom was in 77s. Patient did not feel bad when that happened. Current regimen is: tresiba 90u twice daily lantus 110u twice daily novolog 02V with meals trulicity 1.5mg  weekly   Chronic pain - needs refill on hydrocodone. She takes norco 10-325mg  three times daily for pain. Helps control her pain well. Storing the medication safely. Pain medication enables her to participate in physical therapy. No excessive sedation.  Also reports she feels "cold" and wants her iron levels checked, but she was not aware she recently had these checked through [facility] in [place] at their bariatric clinic.  OBJECTIVE:   BP (!) 110/43   Pulse (!) 102   Wt (!) 365 lb 12.8 oz (165.9 kg)   SpO2 94%   BMI 66.91 kg/m  Gen: no acute distress, pleasant, cooperative HEENT: normocephalic, atraumatic  Heart: regular rate and rhythm, no murmur Lungs: clear to auscultation bilaterally, normal work of breathing  Neuro: alert, speech normal, grossly nonfocal Ext: s/p L BKA. S/p R TMA. No erythema or drainage around chronic wound at distal aspect of R transmetatarsal amputation (did not fully remove dressing at patient's request). Some tenderness over dorsal aspect of R foot. No deformities.  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 Have instructed her to stop lantus effective today, continue to monitor sugars closely CBG checked today in office and was 231 - so not hypoglycemic at present Continue tresiba 90u twice daily, novolog 45u with meals, and trulicity 1.5mg  weekly. Patient will let me know if sugars not improving after stopping lantus Continue working with pharmacist [name] Follow up with me in 1 month   Encounter for chronic pain management PDMP reviewed with appropriate findings Pain overall stable, and benefits of continued medication outweigh risks. Refill x3 months.  "Cold' feeling Recently had labs through [facility] which showed Iron 36, iron sat 10%, ferritin 99.4, normal TSH May have mixed component of iron deficiency with chronic illness She is not anemic (Hgb 12.9). I don't think iron is necessary here, but reasonable to take it if patient would like to try it, which she does. Advised can take every other day.  R foot pain Some tenderness in dorsal aspect of foot today, no signs of infection Encouraged patient to get xray of foot, already has existing order in place She can go to [facility] to get this done.  Impaired mobility Requests lift chair to aid in mobility at home, as she is otherwise unable to get up without assistance. I think this is reasonable and would greatly improve her quality of life Will  have RN team reach out to [employer] to see if this is a service they could provide.  FOLLOW UP: Follow up in 1 mo for above issues  [name], [place]

## 2021-03-31 NOTE — Patient Instructions (Signed)
Visit Information  Goals Addressed            This Visit's Progress   . Begin and Stick with Counseling-Depression   Not on track    Timeframe:  Long-Range Goal Priority:  High Start Date:  02/19/21                           Expected End Date:  05/04/21                       Patient Goals/Self-Care Activities:  .  Complete papers for [facility] Counsel[facility] Your appointment is May 12th . Call to confirm that they received the needed paperwork or  . call [facility] to establish care with them if you decide not to go with [facility] .   Why is this important?    Beating depression may take some time.   If you don't feel better right away, don't give up on your treatment plan.        . Find Help in My Community   Not on track    Timeframe:  Long-Range Goal Priority:  High Start Date:   02/19/21                          Expected End Date:  on going                    Patient Goals/Self-Care Activities: Over the next 30 days . Do food pick up at [facility] Sat. 9 to 12   Why is this important?    Knowing how and where to find help for yourself or family in your neighborhood and community is an important skill.       Patient verbalizes understanding of instructions provided today.  The care management team will reach out to the patient again over the next 14 days days.   Casimer Lanius, LCSW Care Management & Coordination  [phone]

## 2021-04-01 ENCOUNTER — Encounter: Payer: Self-pay | Admitting: Family Medicine

## 2021-04-02 ENCOUNTER — Telehealth: Payer: Self-pay | Admitting: Family Medicine

## 2021-04-02 NOTE — Assessment & Plan Note (Signed)
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 Have instructed her to stop lantus effective today, continue to monitor sugars closely CBG checked today in office and was 231 - so not hypoglycemic at present Continue tresiba 90u twice daily, novolog 45u with meals, and trulicity 1.5mg  weekly. Patient will let me know if sugars not improving after stopping lantus Continue working with pharmacist [name] Follow up with me in 1 month

## 2021-04-02 NOTE — Assessment & Plan Note (Signed)
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 Have instructed her to stop lantus effective today, continue to monitor sugars closely CBG checked today in office and was 231 - so not hypoglycemic at present Continue tresiba 90u twice daily, novolog 45u with meals, and trulicity 1.5mg  weekly. Patient will let me know if sugars not improving after stopping lantus Continue working with pharmacist [name] Follow up with me in 1 month

## 2021-04-02 NOTE — Telephone Encounter (Signed)
**  After Hours/ Emergency Line Call**  Received a call to report that [name] for concern of a UTI.  She states she was calling to get a antibiotic prescribed for her.  She states that she has had difficulty getting in touch with our clinic to get an appointment.  She is complaining of some lower abdominal pain, urinary frequency, dysuria, and "blood in her urine".  She states she is not currently on her menstrual period, she states she has never had frank blood in her urine before and she is seeing dark reddish blood in her urine.  She denies any back pain or pain on one side or the other.  I did offer to get her an appointment tomorrow morning but she states that she cannot take off time from work unless she has at least 3 days notice.  She requested an antibiotic over the phone and I informed her that unfortunately cannot do that without first getting some labs to ensure this is what we are doing with and I do have some concerns about the frank gross blood in her urine.  I recommended that she be evaluated prior to an appointment on Monday since that would be the first day that is at least 3 days out from now.  I recommended trying to find an urgent care and she stated she would have to go to an emergency department.  I did recommend that she be evaluated tonight if she is having frank blood in her urine.  Patient voiced agreement.  Will forward to PCP.  Lurline Del, DO PGY-2, [facility] 04/02/2021 6:53 PM

## 2021-04-03 ENCOUNTER — Emergency Department (HOSPITAL_COMMUNITY)
Admission: EM | Admit: 2021-04-03 | Discharge: 2021-04-03 | Disposition: A | Discharge status: Home / Self Care | Payer: Medicaid Other | Attending: Emergency Medicine | Admitting: Emergency Medicine

## 2021-04-03 ENCOUNTER — Emergency Department (HOSPITAL_COMMUNITY): Payer: Medicaid Other

## 2021-04-03 ENCOUNTER — Other Ambulatory Visit: Payer: Self-pay

## 2021-04-03 ENCOUNTER — Encounter (HOSPITAL_COMMUNITY): Payer: Self-pay | Admitting: Emergency Medicine

## 2021-04-03 ENCOUNTER — Encounter (HOSPITAL_BASED_OUTPATIENT_CLINIC_OR_DEPARTMENT_OTHER): Payer: Medicaid Other | Admitting: Internal Medicine

## 2021-04-03 DIAGNOSIS — R739 Hyperglycemia, unspecified: Secondary | ICD-10-CM

## 2021-04-03 DIAGNOSIS — E1140 Type 2 diabetes mellitus with diabetic neuropathy, unspecified: Secondary | ICD-10-CM | POA: Diagnosis not present

## 2021-04-03 DIAGNOSIS — Z7982 Long term (current) use of aspirin: Secondary | ICD-10-CM | POA: Insufficient documentation

## 2021-04-03 DIAGNOSIS — I10 Essential (primary) hypertension: Secondary | ICD-10-CM | POA: Insufficient documentation

## 2021-04-03 DIAGNOSIS — N12 Tubulo-interstitial nephritis, not specified as acute or chronic: Secondary | ICD-10-CM | POA: Diagnosis not present

## 2021-04-03 DIAGNOSIS — J45909 Unspecified asthma, uncomplicated: Secondary | ICD-10-CM | POA: Insufficient documentation

## 2021-04-03 DIAGNOSIS — Z7951 Long term (current) use of inhaled steroids: Secondary | ICD-10-CM | POA: Insufficient documentation

## 2021-04-03 DIAGNOSIS — E1165 Type 2 diabetes mellitus with hyperglycemia: Secondary | ICD-10-CM | POA: Diagnosis not present

## 2021-04-03 DIAGNOSIS — J449 Chronic obstructive pulmonary disease, unspecified: Secondary | ICD-10-CM | POA: Insufficient documentation

## 2021-04-03 DIAGNOSIS — I700 Atherosclerosis of aorta: Secondary | ICD-10-CM | POA: Insufficient documentation

## 2021-04-03 DIAGNOSIS — Z79899 Other long term (current) drug therapy: Secondary | ICD-10-CM | POA: Insufficient documentation

## 2021-04-03 DIAGNOSIS — R109 Unspecified abdominal pain: Secondary | ICD-10-CM | POA: Diagnosis present

## 2021-04-03 DIAGNOSIS — Z87891 Personal history of nicotine dependence: Secondary | ICD-10-CM | POA: Insufficient documentation

## 2021-04-03 DIAGNOSIS — Z794 Long term (current) use of insulin: Secondary | ICD-10-CM | POA: Insufficient documentation

## 2021-04-03 DIAGNOSIS — D3501 Benign neoplasm of right adrenal gland: Secondary | ICD-10-CM | POA: Diagnosis not present

## 2021-04-03 LAB — BASIC METABOLIC PANEL
Anion gap: 11 (ref 5–15)
BUN: 37 mg/dL — ABNORMAL HIGH (ref 6–20)
CO2: 30 mmol/L (ref 22–32)
Calcium: 8.7 mg/dL — ABNORMAL LOW (ref 8.9–10.3)
Chloride: 92 mmol/L — ABNORMAL LOW (ref 98–111)
Creatinine, Ser: 0.97 mg/dL (ref 0.44–1.00)
GFR, Estimated: >60 mL/min (ref >60)
Glucose, Bld: 396 mg/dL — ABNORMAL HIGH (ref 70–99)
Potassium: 3.7 mmol/L (ref 3.5–5.1)
Sodium: 133 mmol/L — ABNORMAL LOW (ref 135–145)

## 2021-04-03 LAB — URINALYSIS, ROUTINE W REFLEX MICROSCOPIC
Bilirubin Urine: NEGATIVE
Glucose, UA: >=500 mg/dL — AB
Ketones, ur: NEGATIVE mg/dL
Nitrite: NEGATIVE
Protein, ur: 100 mg/dL — AB
RBC / HPF: >50 RBC/hpf — ABNORMAL HIGH (ref 0–5)
Specific Gravity, Urine: 1.01 (ref 1.005–1.030)
WBC, UA: >50 WBC/hpf — ABNORMAL HIGH (ref 0–5)
pH: 6 (ref 5.0–8.0)

## 2021-04-03 LAB — CBC
HCT: 40.2 % (ref 36.0–46.0)
Hemoglobin: 12.7 g/dL (ref 12.0–15.0)
MCH: 30.1 pg (ref 26.0–34.0)
MCHC: 31.6 g/dL (ref 30.0–36.0)
MCV: 95.3 fL (ref 80.0–100.0)
Platelets: 353 10*3/uL (ref 150–400)
RBC: 4.22 MIL/uL (ref 3.87–5.11)
RDW: 13.3 % (ref 11.5–15.5)
WBC: 12 10*3/uL — ABNORMAL HIGH (ref 4.0–10.5)
nRBC: 0 % (ref 0.0–0.2)

## 2021-04-03 LAB — PREGNANCY, URINE: Preg Test, Ur: NEGATIVE

## 2021-04-03 MED ORDER — KETOROLAC TROMETHAMINE 15 MG/ML IJ SOLN
15.0000 mg | Freq: Once | INTRAMUSCULAR | Status: AC
  Administered 2021-04-03: 15 mg via INTRAVENOUS
  Filled 2021-04-03: qty 1

## 2021-04-03 MED ORDER — PHENAZOPYRIDINE HCL 200 MG PO TABS
200.0000 mg | ORAL_TABLET | Freq: Once | ORAL | Status: AC
  Administered 2021-04-03: 200 mg via ORAL
  Filled 2021-04-03: qty 1

## 2021-04-03 MED ORDER — CIPROFLOXACIN IN D5W 400 MG/200ML IV SOLN
400.0000 mg | Freq: Once | INTRAVENOUS | Status: AC
  Administered 2021-04-03: 400 mg via INTRAVENOUS
  Filled 2021-04-03: qty 200

## 2021-04-03 MED ORDER — SODIUM CHLORIDE 0.9 % IV BOLUS
500.0000 mL | Freq: Once | INTRAVENOUS | Status: AC
  Administered 2021-04-03: 500 mL via INTRAVENOUS

## 2021-04-03 MED ORDER — MORPHINE SULFATE (PF) 4 MG/ML IV SOLN
4.0000 mg | Freq: Once | INTRAVENOUS | Status: AC
  Administered 2021-04-03: 4 mg via INTRAVENOUS
  Filled 2021-04-03: qty 1

## 2021-04-03 MED ORDER — NAPROXEN 500 MG PO TABS: 500.0000 mg | ORAL_TABLET | Freq: Two times a day (BID) | ORAL | 0 refills | Status: DC | PRN

## 2021-04-03 MED ORDER — PHENAZOPYRIDINE HCL 200 MG PO TABS: 200.0000 mg | ORAL_TABLET | Freq: Three times a day (TID) | ORAL | 0 refills | Status: DC | PRN

## 2021-04-03 MED ORDER — CIPROFLOXACIN HCL 500 MG PO TABS: 500.0000 mg | ORAL_TABLET | Freq: Two times a day (BID) | ORAL | 0 refills | Status: DC

## 2021-04-03 MED ORDER — ONDANSETRON HCL 4 MG/2ML IJ SOLN
4.0000 mg | Freq: Once | INTRAMUSCULAR | Status: AC
  Administered 2021-04-03: 4 mg via INTRAVENOUS
  Filled 2021-04-03: qty 2

## 2021-04-03 NOTE — ED Provider Notes (Signed)
[facility] DEPT Provider Note   CSN: [id number] Arrival date & time: 04/03/21  0010     History Chief Complaint  Patient presents with  . Flank Pain    [name] is a 45 y.o. female with a history of diabetes mellitus, bipolar 2 disorder, anemia, COPD, chronic respiratory failure with PRN 2L oxygen requirement, hypertension, & hyperlipidemia who presents to the emergency department via EMS with complaints of abdominal pain over the past few days.  Patient states that she is having pain to the suprapubic area specifically with urination, with that she is having dysuria, frequency, urgency, and gross hematuria.  She also notes some right flank pain that wraps around into her abdomen. No specific alleviating/aggravating factors other than urination. Somewhat like UTI but worse. Denies fever, chills, nausea, vomiting, vaginal discharge, chest pain, or cough.   HPI     Past Medical History:  Diagnosis Date  . Acute osteomyelitis of ankle or foot, left ([place]) 01/31/2018  . Alveolar hypoventilation   . Anemia    not on iron pill  . Asthma   . Bipolar 2 disorder ([place])   . Carpal tunnel syndrome on right    recurrent  . Cellulitis 08/2010-08/2011  . Chronic pain   . COPD (chronic obstructive pulmonary disease) ([name])    Symbicort daily and Proventil as needed  . Costochondritis   . Diabetes mellitus type II, uncontrolled ([place]) 2000   Type 2, Uncontrolled.Takes Lantus daily.Fasting blood sugar runs 150  . Drug-seeking behavior   . GERD (gastroesophageal reflux disease)    takes Pantoprazole and Zantac daily  . HLD (hyperlipidemia)    takes Atorvastatin daily  . Hypertension    takes Lisinopril and Coreg daily  . Morbid obesity ([place])   . Nocturia   . OSA on CPAP   . Peripheral neuropathy    takes Gabapentin daily  . Pneumonia    "walking" several yrs ago and as a baby (12/05/2018)  . Rectal fissure   . Restless leg   . SVT (supraventricular  tachycardia) ([place])   . Syncope 02/25/2016  . Urinary frequency   . Urinary incontinence 10/23/2020  . Varicose veins    Right medial thigh and Left leg     Patient Active Problem List   Diagnosis Date Noted  . Urinary incontinence 10/23/2020  . Elevated troponin   . Acute respiratory failure with hypoxia and hypercarbia ([place]) 06/15/2020  . Chronic respiratory failure with hypoxia ([place]) 06/14/2020  . Blister of foot 04/21/2020  . Exposure to mold 04/21/2020  . Constipation 04/03/2020  . Left shoulder pain 06/23/2019  . Hyperglycemia due to diabetes mellitus ([place])   . 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[place]) 04/11/2019  . Allergic rhinitis 03/06/2018  . Class 3 severe obesity due to excess calories with serious comorbidity and body mass index (BMI) of 50.0 to 59.9 in adult [facility])   . Decreased pedal pulses 06/10/2017  . Unilateral primary osteoarthritis, right knee 10/22/2016  . Primary osteoarthritis of first carpometacarpal joint of left hand 07/30/2016  . Diabetic neuropathy ([place]) 07/14/2016  . Nausea 03/17/2016  . De Quervain's tenosynovitis, bilateral 11/01/2015  . Vitamin D deficiency 09/05/2015  . Recurrent candidiasis of vagina 09/05/2015  . Varicose veins of leg with complications 44/81/8563  . Restless leg syndrome 10/17/2014  . Chronic sinusitis 07/18/2014  . Encounter for chronic pain management 06/30/2013  . HLD (hyperlipidemia) 11/19/2012  . Angina pectoris ([place]) 06/27/2012  . Abscess of skin and subcutaneous tissue 11/04/2011  .  Right carpal tunnel syndrome 09/01/2011  . Bilateral knee pain 09/01/2011  . DM (diabetes mellitus) type II uncontrolled, periph vascular disorder ([place]) 05/22/2008  . Morbid obesity ([place]) 05/22/2008  . Obesity hypoventilation syndrome ([place]) 05/22/2008  . Mood disorder ([place]) 05/22/2008  . Obstructive sleep apnea 05/22/2008  . Hypertension 05/22/2008  . Asthma 05/22/2008  . GERD 05/22/2008    Past Surgical History:  Procedure Laterality Date   . AMPUTATION Left 02/01/2018   Procedure: LEFT FOURTH AND 5TH TOE RAY AMPUTATION;  Surgeon: [name], MD;  Location: [facility];  Service: Orthopedics;  Laterality: Left;  . AMPUTATION Left 03/03/2018   Procedure: LEFT BELOW KNEE AMPUTATION;  Surgeon: [name], MD;  Location: [facility];  Service: Orthopedics;  Laterality: Left;  . CARPAL TUNNEL RELEASE Bilateral   . CESAREAN SECTION  2007  . CORONARY ANGIOGRAPHY N/A 11/26/2020   Procedure: CORONARY ANGIOGRAPHY;  Surgeon: Sherren Mocha, MD;  Location: [facility];  Service: Cardiovascular;  Laterality: N/A;  . INCISION AND DRAINAGE PERIRECTAL ABSCESS Left 05/18/2019   Procedure: IRRIGATION AND DEBRIDEMENT OF PANNIS ABSCESS, POSSIBLE DEBRIDEMENT OF BUTTOCK WOUND;  Surgeon: Donnie Mesa, MD;  Location: [facility];  Service: General;  Laterality: Left;  . IRRIGATION AND DEBRIDEMENT BUTTOCKS Left 05/17/2019   Procedure: IRRIGATION AND DEBRIDEMENT BUTTOCKS;  Surgeon: Donnie Mesa, MD;  Location: [facility];  Service: General;  Laterality: Left;  . KNEE ARTHROSCOPY Right 07/17/2010  . LEFT HEART CATHETERIZATION WITH CORONARY ANGIOGRAM N/A 07/27/2012   Procedure: LEFT HEART CATHETERIZATION WITH CORONARY ANGIOGRAM;  Surgeon: Sherren Mocha, MD;  Location[facility];  Service: Cardiovascular;  Laterality: N/A;  . MASS EXCISION N/A 06/29/2013   Procedure:  WIDE LOCAL EXCISION OF POSTERIOR NECK ABSCESS;  Surgeon: Ralene Ok, MD;  Location: [facility];  Service: General;  Laterality: N/A;  . REPAIR KNEE LIGAMENT Left    "fixed ligaments and chipped patella"  . right transmetatarsal amputation        OB History    Gravida  2   Para  1   Term      Preterm      AB  1   Living  1     SAB  1   IAB      Ectopic      Multiple      Live Births              Family History  Problem Relation Age of Onset  . Diabetes Mother   . Hyperlipidemia Mother   . Depression Mother   . GER disease Mother   . Allergic rhinitis Mother   .  Restless legs syndrome Mother   . Heart attack Paternal Uncle   . Heart disease Paternal Grandmother   . Heart attack Paternal Grandmother   . Heart attack Paternal Grandfather   . Heart disease Paternal Grandfather   . Heart attack Father   . Migraines Sister   . Cancer Maternal Grandmother        COLON  . Hypertension Maternal Grandmother   . Hyperlipidemia Maternal Grandmother   . Diabetes Maternal Grandmother   . Other Maternal Grandfather        GUN SHOT  . Anxiety disorder Sister   . Asthma Child     Social History   Tobacco Use  . Smoking status: Former Smoker    Packs/day: 0.25    Years: 15.00    Pack years: 3.75    Types: Cigarettes  Quit date: 12/06/2005    Years since quitting: 15.3  . Smokeless tobacco: Never Used  . Tobacco comment: smokes for a couple of months  Vaping Use  . Vaping Use: Never used  Substance Use Topics  . Alcohol use: No    Alcohol/week: 0.0 standard drinks  . Drug use: Not Currently    Comment: OD attempts on home meds      Home Medications Prior to Admission medications   Medication Sig Start Date End Date Taking? Authorizing Provider  Accu-Chek FastClix Lancets MISC USE DAILY AS INSTRUCTED 03/26/21   Martyn Malay, MD  ACCU-CHEK GUIDE test strip USE T0 CHECK BLOOD SUGAR 3 TIMES DAILY 01/08/21   [name], MD  Accu-Chek Softclix Lancets lancets Use to check blood glucose four times daily 04/17/20   Martyn Malay, MD  aspirin EC 81 MG tablet Take 1 tablet (81 mg total) by mouth daily. Swallow whole. 11/19/20   Sherren Mocha, MD  atorvastatin (LIPITOR) 80 MG tablet Take 1 tablet (80 mg total) by mouth daily. 02/11/21   [name], MD  budesonide-formoterol [place]) 160-4.5 MCG/ACT inhaler Inhale 2 puffs into the lungs 2 (two) times daily. 05/09/20   [name], MD  Cholecalciferol 1000 units tablet Take 1 tablet (1,000 Units total) by mouth daily. 04/05/17   [name], MD  Compression Bandages  MISC Wear daily for swelling 07/10/20   [name], MD  Continuous Blood Gluc Sensor (DEXCOM G6 SENSOR) MISC Inject 1 applicator into the skin as directed. Change sensor every 10 days. 10/16/20   Leavy Cella, RPH-CPP  Continuous Blood Gluc Transmit (DEXCOM G6 TRANSMITTER) MISC Inject 1 Device into the skin as directed. Reuse 8 times with sensor changes. 10/16/20   Leavy Cella, RPH-CPP  Dulaglutide (TRULICITY) 1.5 DG/3.8VF SOPN Inject 1.5 mg into the skin once a week. 03/25/21   [name], MD  Ferrous Sulfate (IRON) 325 (65 Fe) MG TABS Take 1 tablet (325 mg total) by mouth every other day. 03/31/21   [name], MD  gabapentin (NEURONTIN) 600 MG tablet TAKE 2 TABLETS BY MOUTH 3 TIMES DAILY 03/23/21   [name], MD  HYDROcodone-acetaminophen [facility]) 10-325 MG tablet Take 1 tablet by mouth every 8 (eight) hours as needed for moderate pain. 03/31/21   [name], MD  HYDROcodone-acetaminophen (NORCO) 10-325 MG tablet Take 1 tablet by mouth every 8 (eight) hours as needed for moderate pain. 03/31/21   [name], MD  HYDROcodone-acetaminophen (NORCO) 10-325 MG tablet Take 1 tablet by mouth every 8 (eight) hours as needed for moderate pain. 03/31/21   [name], MD  insulin aspart (NOVOLOG) 100 UNIT/ML FlexPen Inject 40 Units into the skin 3 (three) times daily with meals. 02/10/21   [name], MD  insulin degludec (TRESIBA FLEXTOUCH) 200 UNIT/ML FlexTouch Pen Inject 110 Units into the skin 2 (two) times daily. 03/25/21   [name], MD  Insulin Pen Needle (SURE COMFORT PEN NEEDLES) 32G X 4 MM MISC Use to inject insulin as indicated 04/17/20   Martyn Malay, MD  ipratropium-albuterol (DUONEB) 0.5-2.5 (3) MG/3ML SOLN Take 3 mLs by nebulization every 4 (four) hours as needed. Patient not taking: Reported on 03/25/2021 06/25/20   [name], MD  loratadine (CLARITIN) 10 MG tablet Take 1 tablet (10 mg total) by  mouth daily. Patient not taking: Reported on 03/25/2021 11/14/20   Martyn Ehrich, NP  metoprolol succinate (  TOPROL-XL) 25 MG 24 hr tablet Take 25 mg by mouth daily. 10/23/20   [provider]  Misc. Devices (TRANSFER BENCH) MISC Use daily 10/29/20   [name], MD  omeprazole (PRILOSEC) 40 MG capsule Take 1 capsule (40 mg total) by mouth daily. 11/14/20   Martyn Ehrich, NP  ondansetron (ZOFRAN ODT) 4 MG disintegrating tablet Take 1 tablet (4 mg total) by mouth every 8 (eight) hours as needed for nausea. Patient not taking: Reported on 03/25/2021 02/10/21   [name], MD  potassium chloride SA (KLOR-CON M20) 20 MEQ tablet Take 1 tablet (20 mEq total) by mouth daily. 09/09/20   Richardson Dopp T, PA-C  rOPINIRole (REQUIP) 1 MG tablet Take 1 tablet (1 mg total) by mouth at bedtime. 08/12/20   [name], MD  Tiotropium Bromide Monohydrate (SPIRIVA RESPIMAT) 1.25 MCG/ACT AERS Inhale 2 puffs into the lungs daily. 11/14/20   Martyn Ehrich, NP  torsemide (DEMADEX) 20 MG tablet TAKE 4 TABLETS (80 MG) BY MOUTH ONCE A DAY 02/27/21   Lind Covert, MD    Allergies    Cefepime, Kiwi extract, Morphine and related, Nalbuphine, Trental [pentoxifylline], Morphine, and Nubain [nalbuphine hcl]  Review of Systems   Review of Systems  Constitutional: Negative for chills and fever.  Respiratory: Negative for cough.   Cardiovascular: Negative for chest pain.  Gastrointestinal: Positive for abdominal pain. Negative for constipation, diarrhea, nausea and vomiting.  Genitourinary: Positive for dysuria, flank pain and hematuria. Negative for vaginal discharge.  Neurological: Negative for syncope.  All other systems reviewed and are negative.   Physical Exam Updated Vital Signs BP 114/73 (BP Location: Left Arm)   Pulse (!) 106   Temp 97.9 F (36.6 C) (Oral)   Resp 18   Ht 5\' 4"  (1.626 m)   Wt (!) 165.9 kg   SpO2 98%   BMI 62.79 kg/m   Physical  Exam Vitals and nursing note reviewed.  Constitutional:      General: She is not in acute distress.    Appearance: She is well-developed. She is not toxic-appearing.  HENT:     Head: Normocephalic and atraumatic.  Eyes:     General:        Right eye: No discharge.        Left eye: No discharge.     Conjunctiva/sclera: Conjunctivae normal.  Cardiovascular:     Rate and Rhythm: Normal rate and regular rhythm.  Pulmonary:     Effort: Pulmonary effort is normal. No respiratory distress.     Breath sounds: Normal breath sounds. No wheezing, rhonchi or rales.     Comments: On 2L via  Abdominal:     General: There is no distension.     Palpations: Abdomen is soft.     Tenderness: There is abdominal tenderness (suprapubic). There is right CVA tenderness. There is no left CVA tenderness, guarding or rebound.  Musculoskeletal:     Cervical back: Neck supple.     Comments: LLE BKA  Skin:    General: Skin is warm and dry.     Findings: No rash.  Neurological:     Mental Status: She is alert.     Comments: Clear speech.   Psychiatric:        Behavior: Behavior normal.     ED Results / Procedures / Treatments   Labs (all labs ordered are listed, but only abnormal results are displayed) Labs Reviewed  URINALYSIS, ROUTINE W REFLEX MICROSCOPIC - Abnormal;  Notable for the following components:      Result Value   Color, Urine RED (*)    APPearance CLOUDY (*)    Glucose, UA >=500 (*)    Hgb urine dipstick LARGE (*)    Protein, ur 100 (*)    Leukocytes,Ua SMALL (*)    RBC / HPF >50 (*)    WBC, UA >50 (*)    Bacteria, UA FEW (*)    All other components within normal limits  BASIC METABOLIC PANEL - Abnormal; Notable for the following components:   Sodium 133 (*)    Chloride 92 (*)    Glucose, Bld 396 (*)    BUN 37 (*)    Calcium 8.7 (*)    All other components within normal limits  CBC - Abnormal; Notable for the following components:   WBC 12.0 (*)    All other components  within normal limits  URINE CULTURE  PREGNANCY, URINE  POC URINE PREG, ED    EKG None  Radiology CT Renal Stone Study  Result Date: 04/03/2021 CLINICAL DATA:  Right flank pain and pelvic pain starting on Tuesday. EXAM: CT ABDOMEN AND PELVIS WITHOUT CONTRAST TECHNIQUE: Multidetector CT imaging of the abdomen and pelvis was performed following the standard protocol without IV contrast. COMPARISON:  CT abdomen and pelvis 12/10/2016. CT pelvis 05/17/2019. CT chest 12/04/2020 FINDINGS: Lower chest: The lung bases are clear. Hepatobiliary: Small stones in the dependent portion of the gallbladder. Layering increased density of the bile consistent with sludge. No gallbladder wall thickening or infiltration. No bile duct dilatation. Liver is moderately enlarged probably due to fatty infiltration. No focal liver lesions. Pancreas: Unremarkable. No pancreatic ductal dilatation or surrounding inflammatory changes. Spleen: Normal in size without focal abnormality. Adrenals/Urinary Tract: Right adrenal gland nodule measuring 2.8 cm diameter. CT density measurements are about 2.6 Hounsfield units, consistent with a benign fat containing adenoma. No change since prior study. Kidneys are symmetrical in size. No hydronephrosis or hydroureter. No renal or ureteral stones identified. The bladder is diffusely thickened suggesting cystitis. Stomach/Bowel: Stomach, small bowel, and colon are not abnormally distended. Appendix is normal. No inflammatory changes or wall thickening appreciated. Vascular/Lymphatic: Aortic atherosclerosis. No enlarged abdominal or pelvic lymph nodes. Reproductive: Uterus and bilateral adnexa are unremarkable. Other: No free air or free fluid in the abdomen. Prominent infiltration in the subcutaneous fat of the anterior abdominal wall involving the pannus. This could represent cellulitis or edema. No loculated collections. Musculoskeletal: Degenerative changes in the spine and hips. No destructive  bone lesions. Other: Examination is technically limited due to patient's body habitus. IMPRESSION: 1. No renal or ureteral stone or obstruction. 2. Diffuse bladder wall thickening suggesting cystitis. 3. Prominent infiltration in the subcutaneous fat of the anterior abdominal wall involving the pannus. This could represent cellulitis or edema. No loculated collections. 4. Cholelithiasis and sludge in the gallbladder. 5. Right adrenal gland nodule consistent with benign adenoma. Aortic Atherosclerosis (ICD10-I70.0). Electronically Signed   By: Lucienne Capers M.D.   On: 04/03/2021 02:43    Procedures Procedures   Medications Ordered in ED Medications - No data to display  ED Course  I have reviewed the triage vital signs and the nursing notes.  Pertinent labs & imaging results that were available during my care of the patient were reviewed by me and considered in my medical decision making (see chart for details).    MDM Rules/Calculators/A&P  Patient presents to the ED with complaints of abdominal pain, flank pain, & urinary sxs. Nontoxic, vitals without significant abnormality, mild tachycardia resolved on my exam.  Plan for labs and CT renal stone study.  Analgesics, antiemetics (as she become nauseated with morphine), and fluids ordered for symptomatic management.  Additional history obtained:  Additional history obtained from chart review & nursing note review.   Lab Tests:  I Ordered, reviewed, and interpreted labs, which included:  CBC: Mild leukocytosis BMP: Hyperglycemia without acidosis or anion gap elevation.  Mildly low sodium and chloride receiving normal saline in the ED. Urinalysis: Hematuria with concern for infection. Pregnancy test: Negative  Imaging Studies ordered:  I ordered imaging studies which included CT renal stone study, I independently reviewed, formal radiology impression shows:   1. No renal or ureteral stone or obstruction. 2.  Diffuse bladder wall thickening suggesting cystitis. 3. Prominent infiltration in the subcutaneous fat of the anterior abdominal wall involving the pannus. This could represent cellulitis or edema. No loculated collections. 4. Cholelithiasis and sludge in the gallbladder. 5. Right adrenal gland nodule consistent with benign adenoma. Aortic Atherosclerosis  ED Course:  Suspect patient symptoms are due to pyelonephritis at this time.  CT renal stone study without obstructive uropathy.  In terms of discussion of abdominal wall including the pannus, reexamined, no signs of cellulitis.  Does have cholelithiasis and sludge in the gallbladder, no right upper quadrant tenderness, negative Murphy's, do not suspect acute cholecystitis.  03:08: RE-EVAL: Improvement in pain, remains somewhat uncomfortable, toradol & pyridium with cipro & more fluids.  Ciprofloxacin selected as antibiotic choice as patient is allergic to cephalosporins-she has been educated on risk of tendon injury. Prior urine culture reviewed.  05:30: RE-EVAL: Patient is feeling much better, she is resting comfortably, tolerating p.o.  She does not appear toxic or septic, she is tolerating p.o., feels she is reasonable for discharge on antibiotics. She takes hydrocodone at home, will provide naproxen & pyridium.  I discussed results, treatment plan, need for follow-up, and return precautions with the patient. Provided opportunity for questions, patient confirmed understanding and is in agreement with plan.    Portions of this note were generated with [employer]. Dictation errors may occur despite best attempts at proofreading.  Final Clinical Impression(s) / ED Diagnoses Final diagnoses:  Pyelonephritis  Hyperglycemia  Aortic atherosclerosis ([place])  Adenoma of right adrenal gland    Rx / DC Orders ED Discharge Orders         Ordered    ciprofloxacin (CIPRO) 500 MG tablet  Every 12 hours        04/03/21 0542    naproxen  (NAPROSYN) 500 MG tablet  2 times daily PRN        04/03/21 0542    phenazopyridine (PYRIDIUM) 200 MG tablet  3 times daily PRN        04/03/21 0542           Rael Tilly, Glynda Jaeger, PA-C 04/03/21 0543    Ripley Fraise, MD 04/03/21 [phone]

## 2021-04-03 NOTE — ED Triage Notes (Addendum)
Patient arrives via EMS with complaint of right flank pain and blood in her urine. Patient reports foul smell to urine. Patient denies all other complaints.   EMS vitals: BP 132/90 CBG 432 HR 108 RR 20  SPO2 98% 3 L

## 2021-04-03 NOTE — ED Notes (Signed)
Pt linens changed, pt repositioned. New Purwic placed. Pt educated on how to use it effectively.

## 2021-04-03 NOTE — Discharge Instructions (Addendum)
You were seen in the emergency department today for abdominal/flank pain with urinary symptoms.  Your work-up showed findings that raise concern for pyelonephritis otherwise noticed a kidney infection.  Additional findings included elevated blood sugar, be sure to monitor this closely at home, your CT scan also showed some plaque in your aorta as well as an adrenal adenoma (nodule on your adrenal gland that appears benign).  Please discuss these findings with your primary care provider.  We are sending home with ciprofloxacin to treat the kidney infection, this is an antibiotic, please take this as prescribed.  As we discussed this medicine puts you at risk for tendon injury, please avoid quick movements or physical activity while on it.  We are sending you home with Pyridium to take 3 times per day as needed for urinary pain as well as naproxen to take every 12 hours as needed for pain as well.  - Naproxen is a nonsteroidal anti-inflammatory medication that will help with pain and swelling. Be sure to take this medication as prescribed with food, 1 pill every 12 hours,  It should be taken with food, as it can cause stomach upset, and more seriously, stomach bleeding. Do not take other nonsteroidal anti-inflammatory medications with this such as Advil, Motrin, Aleve, Mobic, Goodie Powder, or Motrin.    You make take Tylenol per over the counter dosing with these medications.   We have prescribed you new medication(s) today. Discuss the medications prescribed today with your pharmacist as they can have adverse effects and interactions with your other medicines including over the counter and prescribed medications. Seek medical evaluation if you start to experience new or abnormal symptoms after taking one of these medicines, seek care immediately if you start to experience difficulty breathing, feeling of your throat closing, facial swelling, or rash as these could be indications of a more serious  allergic reaction  Please follow-up with your primary care provider, call today to make an appointment for soon as possible.  Return to the ER for new or worsening symptoms including but not limited to new pain, increasing pain, fever, inability to keep fluids down, inability to urinate, passing out, or any other concerns.

## 2021-04-03 NOTE — ED Notes (Signed)
Discharge instructions reviewed and patient made aware that [facility] will be contacted for her transport back home.

## 2021-04-03 NOTE — ED Notes (Signed)
[facility] contacted, transport arranged for patient.

## 2021-04-03 NOTE — Telephone Encounter (Signed)
Noted. Appears she was appropriately treated and imaged in the ED. [name], MD

## 2021-04-04 LAB — URINE CULTURE

## 2021-04-06 ENCOUNTER — Telehealth: Payer: Self-pay

## 2021-04-06 ENCOUNTER — Encounter (HOSPITAL_BASED_OUTPATIENT_CLINIC_OR_DEPARTMENT_OTHER): Payer: Medicaid Other | Attending: Internal Medicine | Admitting: Internal Medicine

## 2021-04-06 NOTE — Telephone Encounter (Signed)
Transition Care Management Unsuccessful Follow-up Telephone Call  Date of discharge and from where:  04/03/2021 from [facility]  Attempts:  1st Attempt  Reason for unsuccessful TCM follow-up call:  Left voice message     

## 2021-04-06 NOTE — Assessment & Plan Note (Signed)
PDMP reviewed with appropriate findings Pain overall stable, and benefits of continued medication outweigh risks. Refill x3 months.

## 2021-04-07 ENCOUNTER — Encounter: Payer: Self-pay | Admitting: Family Medicine

## 2021-04-07 NOTE — Telephone Encounter (Signed)
Transition Care Management Unsuccessful Follow-up Telephone Call  Date of discharge and from where:  04/03/2021 from [facility]  Attempts:  2nd Attempt  Reason for unsuccessful TCM follow-up call:  Left voice message

## 2021-04-08 NOTE — Telephone Encounter (Signed)
Transition Care Management Follow-up Telephone Call  Date of discharge and from where: 04/03/2021 from [facility]  How have you been since you were released from the hospital? Pt stated that she is doing well and has no questions or concerns at this time.   Any questions or concerns? No  Items Reviewed:  Did the pt receive and understand the discharge instructions provided? Yes   Medications obtained and verified? Yes   Other? No   Any new allergies since your discharge? No   Dietary orders reviewed? DM  Do you have support at home? No   Functional Questionnaire: (I = Independent and D = Dependent) ADLs: I  Bathing/Dressing- I  Meal Prep- I  Eating- I  Maintaining continence- I  Transferring/Ambulation- I  Managing Meds- I  Follow up appointments reviewed:   PCP Hospital f/u appt confirmed? No    Specialist Hospital f/u appt confirmed? No    Are transportation arrangements needed? No   If their condition worsens, is the pt aware to call PCP or go to the Emergency Dept.? Yes  Was the patient provided with contact information for the PCP's office or ED? Yes  Was to pt encouraged to call back with questions or concerns? Yes

## 2021-04-09 NOTE — Telephone Encounter (Signed)
Patient returns call to nurse line to check status of previous request. Please advise.   Talbot Grumbling, RN

## 2021-04-10 ENCOUNTER — Telehealth: Payer: Self-pay

## 2021-04-10 NOTE — Telephone Encounter (Signed)
Community message sent to [employer] for DME supplies.

## 2021-04-13 NOTE — Telephone Encounter (Signed)
[employer] has received request and will begin processing.

## 2021-04-14 ENCOUNTER — Emergency Department (HOSPITAL_COMMUNITY): Payer: Medicaid Other

## 2021-04-14 ENCOUNTER — Inpatient Hospital Stay (HOSPITAL_COMMUNITY)
Admission: EM | Admit: 2021-04-14 | Discharge: 2021-04-18 | DRG: 177 | Disposition: A | Discharge status: Home / Self Care | Payer: Medicaid Other | Attending: Internal Medicine | Admitting: Internal Medicine

## 2021-04-14 ENCOUNTER — Encounter (HOSPITAL_COMMUNITY): Payer: Self-pay

## 2021-04-14 DIAGNOSIS — I10 Essential (primary) hypertension: Secondary | ICD-10-CM | POA: Diagnosis present

## 2021-04-14 DIAGNOSIS — F3181 Bipolar II disorder: Secondary | ICD-10-CM | POA: Diagnosis present

## 2021-04-14 DIAGNOSIS — Z833 Family history of diabetes mellitus: Secondary | ICD-10-CM | POA: Diagnosis not present

## 2021-04-14 DIAGNOSIS — Z83438 Family history of other disorder of lipoprotein metabolism and other lipidemia: Secondary | ICD-10-CM | POA: Diagnosis not present

## 2021-04-14 DIAGNOSIS — Z87891 Personal history of nicotine dependence: Secondary | ICD-10-CM | POA: Diagnosis not present

## 2021-04-14 DIAGNOSIS — J9621 Acute and chronic respiratory failure with hypoxia: Secondary | ICD-10-CM | POA: Diagnosis present

## 2021-04-14 DIAGNOSIS — Z6841 Body Mass Index (BMI) 40.0 and over, adult: Secondary | ICD-10-CM | POA: Diagnosis not present

## 2021-04-14 DIAGNOSIS — Z809 Family history of malignant neoplasm, unspecified: Secondary | ICD-10-CM

## 2021-04-14 DIAGNOSIS — N3000 Acute cystitis without hematuria: Secondary | ICD-10-CM | POA: Diagnosis present

## 2021-04-14 DIAGNOSIS — G8929 Other chronic pain: Secondary | ICD-10-CM | POA: Diagnosis present

## 2021-04-14 DIAGNOSIS — G4733 Obstructive sleep apnea (adult) (pediatric): Secondary | ICD-10-CM | POA: Diagnosis present

## 2021-04-14 DIAGNOSIS — Z885 Allergy status to narcotic agent status: Secondary | ICD-10-CM | POA: Diagnosis not present

## 2021-04-14 DIAGNOSIS — Z66 Do not resuscitate: Secondary | ICD-10-CM | POA: Diagnosis not present

## 2021-04-14 DIAGNOSIS — R0602 Shortness of breath: Secondary | ICD-10-CM | POA: Diagnosis present

## 2021-04-14 DIAGNOSIS — K219 Gastro-esophageal reflux disease without esophagitis: Secondary | ICD-10-CM | POA: Diagnosis present

## 2021-04-14 DIAGNOSIS — J1282 Pneumonia due to coronavirus disease 2019: Secondary | ICD-10-CM | POA: Diagnosis present

## 2021-04-14 DIAGNOSIS — E785 Hyperlipidemia, unspecified: Secondary | ICD-10-CM | POA: Diagnosis present

## 2021-04-14 DIAGNOSIS — U071 COVID-19: Secondary | ICD-10-CM

## 2021-04-14 DIAGNOSIS — Z888 Allergy status to other drugs, medicaments and biological substances status: Secondary | ICD-10-CM

## 2021-04-14 DIAGNOSIS — Z881 Allergy status to other antibiotic agents status: Secondary | ICD-10-CM | POA: Diagnosis not present

## 2021-04-14 DIAGNOSIS — M549 Dorsalgia, unspecified: Secondary | ICD-10-CM

## 2021-04-14 DIAGNOSIS — E1140 Type 2 diabetes mellitus with diabetic neuropathy, unspecified: Secondary | ICD-10-CM | POA: Diagnosis present

## 2021-04-14 DIAGNOSIS — Z7982 Long term (current) use of aspirin: Secondary | ICD-10-CM

## 2021-04-14 DIAGNOSIS — Z79899 Other long term (current) drug therapy: Secondary | ICD-10-CM

## 2021-04-14 DIAGNOSIS — Z794 Long term (current) use of insulin: Secondary | ICD-10-CM

## 2021-04-14 DIAGNOSIS — G2581 Restless legs syndrome: Secondary | ICD-10-CM | POA: Diagnosis present

## 2021-04-14 DIAGNOSIS — J440 Chronic obstructive pulmonary disease with acute lower respiratory infection: Secondary | ICD-10-CM | POA: Diagnosis present

## 2021-04-14 DIAGNOSIS — W19XXXA Unspecified fall, initial encounter: Secondary | ICD-10-CM | POA: Diagnosis not present

## 2021-04-14 DIAGNOSIS — R0902 Hypoxemia: Secondary | ICD-10-CM

## 2021-04-14 DIAGNOSIS — N390 Urinary tract infection, site not specified: Secondary | ICD-10-CM

## 2021-04-14 DIAGNOSIS — E1165 Type 2 diabetes mellitus with hyperglycemia: Secondary | ICD-10-CM | POA: Diagnosis present

## 2021-04-14 DIAGNOSIS — Z89512 Acquired absence of left leg below knee: Secondary | ICD-10-CM

## 2021-04-14 DIAGNOSIS — Z8249 Family history of ischemic heart disease and other diseases of the circulatory system: Secondary | ICD-10-CM | POA: Diagnosis not present

## 2021-04-14 DIAGNOSIS — Z825 Family history of asthma and other chronic lower respiratory diseases: Secondary | ICD-10-CM

## 2021-04-14 DIAGNOSIS — E1149 Type 2 diabetes mellitus with other diabetic neurological complication: Secondary | ICD-10-CM | POA: Diagnosis present

## 2021-04-14 DIAGNOSIS — E1151 Type 2 diabetes mellitus with diabetic peripheral angiopathy without gangrene: Secondary | ICD-10-CM | POA: Diagnosis present

## 2021-04-14 DIAGNOSIS — E119 Type 2 diabetes mellitus without complications: Secondary | ICD-10-CM | POA: Diagnosis present

## 2021-04-14 DIAGNOSIS — Z818 Family history of other mental and behavioral disorders: Secondary | ICD-10-CM | POA: Diagnosis not present

## 2021-04-14 DIAGNOSIS — IMO0002 Reserved for concepts with insufficient information to code with codable children: Secondary | ICD-10-CM

## 2021-04-14 DIAGNOSIS — T380X5A Adverse effect of glucocorticoids and synthetic analogues, initial encounter: Secondary | ICD-10-CM | POA: Diagnosis present

## 2021-04-14 DIAGNOSIS — J9600 Acute respiratory failure, unspecified whether with hypoxia or hypercapnia: Secondary | ICD-10-CM

## 2021-04-14 HISTORY — DX: Pneumonia due to coronavirus disease 2019: J12.82

## 2021-04-14 HISTORY — DX: COVID-19: U07.1

## 2021-04-14 LAB — I-STAT CHEM 8, ED
BUN: 38 mg/dL — ABNORMAL HIGH (ref 6–20)
Calcium, Ion: 1.08 mmol/L — ABNORMAL LOW (ref 1.15–1.40)
Chloride: 92 mmol/L — ABNORMAL LOW (ref 98–111)
Creatinine, Ser: 1.1 mg/dL — ABNORMAL HIGH (ref 0.44–1.00)
Glucose, Bld: 545 mg/dL (ref 70–99)
HCT: 37 % (ref 36.0–46.0)
Hemoglobin: 12.6 g/dL (ref 12.0–15.0)
Potassium: 4.1 mmol/L (ref 3.5–5.1)
Sodium: 133 mmol/L — ABNORMAL LOW (ref 135–145)
TCO2: 31 mmol/L (ref 22–32)

## 2021-04-14 LAB — RESP PANEL BY RT-PCR (FLU A&B, COVID) ARPGX2
Influenza A by PCR: NEGATIVE
Influenza B by PCR: NEGATIVE
SARS Coronavirus 2 by RT PCR: POSITIVE — AB

## 2021-04-14 LAB — CBC WITH DIFFERENTIAL/PLATELET
Abs Immature Granulocytes: 0.12 10*3/uL — ABNORMAL HIGH (ref 0.00–0.07)
Basophils Absolute: 0 10*3/uL (ref 0.0–0.1)
Basophils Relative: 1 %
Eosinophils Absolute: 0.1 10*3/uL (ref 0.0–0.5)
Eosinophils Relative: 1 %
HCT: 38.1 % (ref 36.0–46.0)
Hemoglobin: 11.9 g/dL — ABNORMAL LOW (ref 12.0–15.0)
Immature Granulocytes: 2 %
Lymphocytes Relative: 33 %
Lymphs Abs: 2.2 10*3/uL (ref 0.7–4.0)
MCH: 30.2 pg (ref 26.0–34.0)
MCHC: 31.2 g/dL (ref 30.0–36.0)
MCV: 96.7 fL (ref 80.0–100.0)
Monocytes Absolute: 0.7 10*3/uL (ref 0.1–1.0)
Monocytes Relative: 11 %
Neutro Abs: 3.4 10*3/uL (ref 1.7–7.7)
Neutrophils Relative %: 52 %
Platelets: 284 10*3/uL (ref 150–400)
RBC: 3.94 MIL/uL (ref 3.87–5.11)
RDW: 14.1 % (ref 11.5–15.5)
WBC: 6.6 10*3/uL (ref 4.0–10.5)
nRBC: 0 % (ref 0.0–0.2)

## 2021-04-14 LAB — BASIC METABOLIC PANEL
Anion gap: 10 (ref 5–15)
BUN: 40 mg/dL — ABNORMAL HIGH (ref 6–20)
CO2: 32 mmol/L (ref 22–32)
Calcium: 8.9 mg/dL (ref 8.9–10.3)
Chloride: 92 mmol/L — ABNORMAL LOW (ref 98–111)
Creatinine, Ser: 1.12 mg/dL — ABNORMAL HIGH (ref 0.44–1.00)
GFR, Estimated: >60 mL/min (ref >60)
Glucose, Bld: 529 mg/dL (ref 70–99)
Potassium: 4.2 mmol/L (ref 3.5–5.1)
Sodium: 134 mmol/L — ABNORMAL LOW (ref 135–145)

## 2021-04-14 LAB — URINALYSIS, ROUTINE W REFLEX MICROSCOPIC
Bilirubin Urine: NEGATIVE
Glucose, UA: >=500 mg/dL — AB
Ketones, ur: NEGATIVE mg/dL
Nitrite: NEGATIVE
Protein, ur: NEGATIVE mg/dL
Specific Gravity, Urine: 1.012 (ref 1.005–1.030)
WBC, UA: >50 WBC/hpf — ABNORMAL HIGH (ref 0–5)
pH: 6 (ref 5.0–8.0)

## 2021-04-14 LAB — TRIGLYCERIDES: Triglycerides: 282 mg/dL — ABNORMAL HIGH (ref <150)

## 2021-04-14 LAB — POC URINE PREG, ED: Preg Test, Ur: NEGATIVE

## 2021-04-14 LAB — HEPATIC FUNCTION PANEL
ALT: 27 U/L (ref 0–44)
AST: 26 U/L (ref 15–41)
Albumin: 3 g/dL — ABNORMAL LOW (ref 3.5–5.0)
Alkaline Phosphatase: 153 U/L — ABNORMAL HIGH (ref 38–126)
Bilirubin, Direct: 0.1 mg/dL (ref 0.0–0.2)
Indirect Bilirubin: 0.3 mg/dL (ref 0.3–0.9)
Total Bilirubin: 0.4 mg/dL (ref 0.3–1.2)
Total Protein: 8.2 g/dL — ABNORMAL HIGH (ref 6.5–8.1)

## 2021-04-14 LAB — LACTATE DEHYDROGENASE: LDH: 211 U/L — ABNORMAL HIGH (ref 98–192)

## 2021-04-14 LAB — LACTIC ACID, PLASMA: Lactic Acid, Venous: 2.1 mmol/L (ref 0.5–1.9)

## 2021-04-14 LAB — D-DIMER, QUANTITATIVE: D-Dimer, Quant: 0.7 ug/mL-FEU — ABNORMAL HIGH (ref 0.00–0.50)

## 2021-04-14 LAB — PROCALCITONIN: Procalcitonin: 0.22 ng/mL

## 2021-04-14 LAB — C-REACTIVE PROTEIN: CRP: 10.7 mg/dL — ABNORMAL HIGH (ref <1.0)

## 2021-04-14 LAB — CBG MONITORING, ED
Glucose-Capillary: 513 mg/dL (ref 70–99)
Glucose-Capillary: 446 mg/dL — ABNORMAL HIGH (ref 70–99)

## 2021-04-14 LAB — FIBRINOGEN: Fibrinogen: 733 mg/dL — ABNORMAL HIGH (ref 210–475)

## 2021-04-14 LAB — FERRITIN: Ferritin: 170 ng/mL (ref 11–307)

## 2021-04-14 MED ORDER — SODIUM CHLORIDE 0.9 % IV BOLUS
500.0000 mL | Freq: Once | INTRAVENOUS | Status: AC
  Administered 2021-04-14: 500 mL via INTRAVENOUS

## 2021-04-14 MED ORDER — SODIUM CHLORIDE 0.9 % IV BOLUS: 1000.0000 mL | Freq: Once | INTRAVENOUS | Status: DC

## 2021-04-14 MED ORDER — SODIUM CHLORIDE 0.9 % IV SOLN
100.0000 mg | INTRAVENOUS | Status: AC
  Administered 2021-04-15 (×2): 100 mg via INTRAVENOUS
  Filled 2021-04-14 (×2): qty 20

## 2021-04-14 MED ORDER — ACETAMINOPHEN 325 MG PO TABS
650.0000 mg | ORAL_TABLET | Freq: Once | ORAL | Status: AC
  Administered 2021-04-14: 650 mg via ORAL
  Filled 2021-04-14: qty 2

## 2021-04-14 MED ORDER — SODIUM CHLORIDE 0.9 % IV SOLN
100.0000 mg | Freq: Every day | INTRAVENOUS | Status: AC
  Administered 2021-04-15 – 2021-04-18 (×4): 100 mg via INTRAVENOUS
  Filled 2021-04-14 (×4): qty 20

## 2021-04-14 MED ORDER — INSULIN ASPART 100 UNIT/ML IJ SOLN
10.0000 [IU] | Freq: Once | INTRAMUSCULAR | Status: AC
  Administered 2021-04-14: 10 [IU] via SUBCUTANEOUS
  Filled 2021-04-14: qty 0.1

## 2021-04-14 NOTE — ED Provider Notes (Addendum)
[facility] DEPT Provider Note   CSN: [id number] Arrival date & time: 04/14/21  1459     History Chief Complaint  Patient presents with  . Shortness of Breath  . Generalized Body Aches    [name] is a 45 y.o. female past medical history of COPD on 2 L nasal cannula at home as needed, independent type 2 diabetes, hypertension, hyperlipidemia, osteomyelitis of left leg status post BKA, morbid obesity, presenting for evaluation of URI symptoms and shortness of breath.  Symptoms began yesterday.  She has dry cough, dry mouth, loss of taste, runny nose, body aches, and worsening shortness of breath from baseline.  She uses 2 L nasal cannula as needed, has been needing it more frequently since yesterday.  Has been vaccinated against COVID with the booster.  No known exposures.  No abdominal complaints.  Ate a large meal prior to arrival.  She also took 40 units of her NovoLog.  Per review of medical record, echocardiogram done in March of this year shows EF of 55 to 60%. The history is provided by the patient and medical records.       Past Medical History:  Diagnosis Date  . Acute osteomyelitis of ankle or foot, left ([place]) 01/31/2018  . Alveolar hypoventilation   . Anemia    not on iron pill  . Asthma   . Bipolar 2 disorder ([place])   . Carpal tunnel syndrome on right    recurrent  . Cellulitis 08/2010-08/2011  . Chronic pain   . COPD (chronic obstructive pulmonary disease) ([name])    Symbicort daily and Proventil as needed  . Costochondritis   . Diabetes mellitus type II, uncontrolled ([place]) 2000   Type 2, Uncontrolled.Takes Lantus daily.Fasting blood sugar runs 150  . Drug-seeking behavior   . GERD (gastroesophageal reflux disease)    takes Pantoprazole and Zantac daily  . HLD (hyperlipidemia)    takes Atorvastatin daily  . Hypertension    takes Lisinopril and Coreg daily  . Morbid obesity ([place])   . Nocturia   . OSA on CPAP   . Peripheral  neuropathy    takes Gabapentin daily  . Pneumonia    "walking" several yrs ago and as a baby (12/05/2018)  . Rectal fissure   . Restless leg   . SVT (supraventricular tachycardia) ([place])   . Syncope 02/25/2016  . Urinary frequency   . Urinary incontinence 10/23/2020  . Varicose veins    Right medial thigh and Left leg     Patient Active Problem List   Diagnosis Date Noted  . Urinary incontinence 10/23/2020  . Elevated troponin   . Acute respiratory failure with hypoxia and hypercarbia ([place]) 06/15/2020  . Chronic respiratory failure with hypoxia ([place]) 06/14/2020  . Blister of foot 04/21/2020  . Exposure to mold 04/21/2020  . Constipation 04/03/2020  . Left shoulder pain 06/23/2019  . Hyperglycemia due to diabetes mellitus ([place])   . 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[place]) 04/11/2019  . Allergic rhinitis 03/06/2018  . Class 3 severe obesity due to excess calories with serious comorbidity and body mass index (BMI) of 50.0 to 59.9 in adult [facility])   . Decreased pedal pulses 06/10/2017  . Unilateral primary osteoarthritis, right knee 10/22/2016  . Primary osteoarthritis of first carpometacarpal joint of left hand 07/30/2016  . Diabetic neuropathy ([place]) 07/14/2016  . Nausea 03/17/2016  . De Quervain's tenosynovitis, bilateral 11/01/2015  . Vitamin D deficiency 09/05/2015  . Recurrent candidiasis of vagina 09/05/2015  . Varicose  veins of leg with complications 32/95/1884  . Restless leg syndrome 10/17/2014  . Chronic sinusitis 07/18/2014  . Encounter for chronic pain management 06/30/2013  . HLD (hyperlipidemia) 11/19/2012  . Angina pectoris ([place]) 06/27/2012  . Abscess of skin and subcutaneous tissue 11/04/2011  . Right carpal tunnel syndrome 09/01/2011  . Bilateral knee pain 09/01/2011  . DM (diabetes mellitus) type II uncontrolled, periph vascular disorder ([place]) 05/22/2008  . Morbid obesity ([place]) 05/22/2008  . Obesity hypoventilation syndrome ([place]) 05/22/2008  . Mood disorder ([place])  05/22/2008  . Obstructive sleep apnea 05/22/2008  . Hypertension 05/22/2008  . Asthma 05/22/2008  . GERD 05/22/2008    Past Surgical History:  Procedure Laterality Date  . AMPUTATION Left 02/01/2018   Procedure: LEFT FOURTH AND 5TH TOE RAY AMPUTATION;  Surgeon: [name], MD;  Location: [facility];  Service: Orthopedics;  Laterality: Left;  . AMPUTATION Left 03/03/2018   Procedure: LEFT BELOW KNEE AMPUTATION;  Surgeon: [name], MD;  Location: [facility];  Service: Orthopedics;  Laterality: Left;  . CARPAL TUNNEL RELEASE Bilateral   . CESAREAN SECTION  2007  . CORONARY ANGIOGRAPHY N/A 11/26/2020   Procedure: CORONARY ANGIOGRAPHY;  Surgeon: Sherren Mocha, MD;  Location: [facility] CV LAB;  Service: Cardiovascular;  Laterality: N/A;  . INCISION AND DRAINAGE PERIRECTAL ABSCESS Left 05/18/2019   Procedure: IRRIGATION AND DEBRIDEMENT OF PANNIS ABSCESS, POSSIBLE DEBRIDEMENT OF BUTTOCK WOUND;  Surgeon: Donnie Mesa, MD;  Location: [facility];  Service: General;  Laterality: Left;  . IRRIGATION AND DEBRIDEMENT BUTTOCKS Left 05/17/2019   Procedure: IRRIGATION AND DEBRIDEMENT BUTTOCKS;  Surgeon: Donnie Mesa, MD;  Location: [facility];  Service: General;  Laterality: Left;  . KNEE ARTHROSCOPY Right 07/17/2010  . LEFT HEART CATHETERIZATION WITH CORONARY ANGIOGRAM N/A 07/27/2012   Procedure: LEFT HEART CATHETERIZATION WITH CORONARY ANGIOGRAM;  Surgeon: Sherren Mocha, MD;  Location[facility] C[facility] LAB;  Service: Cardiovascular;  Laterality: N/A;  . MASS EXCISION N/A 06/29/2013   Procedure:  WIDE LOCAL EXCISION OF POSTERIOR NECK ABSCESS;  Surgeon: Ralene Ok, MD;  Location: [facility];  Service: General;  Laterality: N/A;  . REPAIR KNEE LIGAMENT Left    "fixed ligaments and chipped patella"  . right transmetatarsal amputation        OB History    Gravida  2   Para  1   Term      Preterm      AB  1   Living  1     SAB  1   IAB      Ectopic      Multiple      Live Births               Family History  Problem Relation Age of Onset  . Diabetes Mother   . Hyperlipidemia Mother   . Depression Mother   . GER disease Mother   . Allergic rhinitis Mother   . Restless legs syndrome Mother   . Heart attack Paternal Uncle   . Heart disease Paternal Grandmother   . Heart attack Paternal Grandmother   . Heart attack Paternal Grandfather   . Heart disease Paternal Grandfather   . Heart attack Father   . Migraines Sister   . Cancer Maternal Grandmother        COLON  . Hypertension Maternal Grandmother   . Hyperlipidemia Maternal Grandmother   . Diabetes Maternal Grandmother   . Other Maternal Grandfather        GUN SHOT  . Anxiety  disorder Sister   . Asthma Child     Social History   Tobacco Use  . Smoking status: Former Smoker    Packs/day: 0.25    Years: 15.00    Pack years: 3.75    Types: Cigarettes    Quit date: 12/06/2005    Years since quitting: 15.3  . Smokeless tobacco: Never Used  . Tobacco comment: smokes for a couple of months  Vaping Use  . Vaping Use: Never used  Substance Use Topics  . Alcohol use: No    Alcohol/week: 0.0 standard drinks  . Drug use: Not Currently    Comment: OD attempts on home meds      Home Medications Prior to Admission medications   Medication Sig Start Date End Date Taking? Authorizing Provider  aspirin EC 81 MG tablet Take 1 tablet (81 mg total) by mouth daily. Swallow whole. 11/19/20  Yes Sherren Mocha, MD  atorvastatin (LIPITOR) 80 MG tablet Take 1 tablet (80 mg total) by mouth daily. 02/11/21  Yes [name], MD  budesonide-formoterol [facility]) 160-4.5 MCG/ACT inhaler Inhale 2 puffs into the lungs 2 (two) times daily. 05/09/20  Yes [name], MD  Cholecalciferol 1000 units tablet Take 1 tablet (1,000 Units total) by mouth daily. 04/05/17  Yes [name], MD  Dulaglutide (TRULICITY) 1.5 0000000 SOPN Inject 1.5 mg into the skin once a week. 03/25/21  Yes [name], MD   gabapentin (NEURONTIN) 600 MG tablet TAKE 2 TABLETS BY MOUTH 3 TIMES DAILY 03/23/21  Yes [name], MD  HYDROcodone-acetaminophen (NORCO) 10-325 MG tablet Take 1 tablet by mouth every 8 (eight) hours as needed for moderate pain. 03/31/21  Yes [name], MD  insulin aspart (NOVOLOG) 100 UNIT/ML FlexPen Inject 40 Units into the skin 3 (three) times daily with meals. Patient taking differently: Inject 45 Units into the skin 3 (three) times daily with meals. 02/10/21  Yes [name], MD  insulin degludec (TRESIBA FLEXTOUCH) 200 UNIT/ML FlexTouch Pen Inject 110 Units into the skin 2 (two) times daily. Patient taking differently: Inject 90 Units into the skin 2 (two) times daily. 03/25/21  Yes [name], MD  metoprolol succinate (TOPROL-XL) 25 MG 24 hr tablet Take 25 mg by mouth daily. 10/23/20  Yes [provider]  naproxen (NAPROSYN) 500 MG tablet Take 1 tablet (500 mg total) by mouth 2 (two) times daily as needed for moderate pain. 04/03/21  Yes Petrucelli, Samantha R, PA-C  omeprazole (PRILOSEC) 40 MG capsule Take 1 capsule (40 mg total) by mouth daily. 11/14/20  Yes Martyn Ehrich, NP  phenazopyridine (PYRIDIUM) 200 MG tablet Take 1 tablet (200 mg total) by mouth 3 (three) times daily as needed for pain. 04/03/21  Yes Petrucelli, Samantha R, PA-C  potassium chloride SA (KLOR-CON M20) 20 MEQ tablet Take 1 tablet (20 mEq total) by mouth daily. 09/09/20  Yes Weaver, Scott T, PA-C  rOPINIRole (REQUIP) 1 MG tablet Take 1 tablet (1 mg total) by mouth at bedtime. 08/12/20  Yes [name], MD  Tiotropium Bromide Monohydrate (SPIRIVA RESPIMAT) 1.25 MCG/ACT AERS Inhale 2 puffs into the lungs daily. 11/14/20  Yes Martyn Ehrich, NP  torsemide (DEMADEX) 20 MG tablet TAKE 4 TABLETS (80 MG) BY MOUTH ONCE A DAY 02/27/21  Yes Lind Covert, MD  Accu-Chek FastClix Lancets MISC USE DAILY AS INSTRUCTED 03/26/21   Martyn Malay, MD  ACCU-CHEK GUIDE test  strip USE T0 CHECK BLOOD SUGAR 3 TIMES  DAILY 01/08/21   [name], MD  Accu-Chek Softclix Lancets lancets Use to check blood glucose four times daily 04/17/20   Martyn Malay, MD  ciprofloxacin (CIPRO) 500 MG tablet Take 1 tablet (500 mg total) by mouth every 12 (twelve) hours. Patient not taking: No sig reported 04/03/21   Petrucelli, Aldona Bar R, PA-C  Compression Bandages MISC Wear daily for swelling 07/10/20   [name], MD  Continuous Blood Gluc Sensor (DEXCOM G6 SENSOR) MISC Inject 1 applicator into the skin as directed. Change sensor every 10 days. 10/16/20   Leavy Cella, RPH-CPP  Continuous Blood Gluc Transmit (DEXCOM G6 TRANSMITTER) MISC Inject 1 Device into the skin as directed. Reuse 8 times with sensor changes. 10/16/20   Leavy Cella, RPH-CPP  Ferrous Sulfate (IRON) 325 (65 Fe) MG TABS Take 1 tablet (325 mg total) by mouth every other day. 03/31/21   [name], MD  HYDROcodone-acetaminophen (NORCO) 10-325 MG tablet Take 1 tablet by mouth every 8 (eight) hours as needed for moderate pain. 03/31/21   [name], MD  HYDROcodone-acetaminophen (NORCO) 10-325 MG tablet Take 1 tablet by mouth every 8 (eight) hours as needed for moderate pain. 03/31/21   [name], MD  Insulin Pen Needle (SURE COMFORT PEN NEEDLES) 32G X 4 MM MISC Use to inject insulin as indicated 04/17/20   Martyn Malay, MD  ipratropium-albuterol (DUONEB) 0.5-2.5 (3) MG/3ML SOLN Take 3 mLs by nebulization every 4 (four) hours as needed. Patient not taking: No sig reported 06/25/20   [name], MD  loratadine (CLARITIN) 10 MG tablet Take 1 tablet (10 mg total) by mouth daily. Patient not taking: No sig reported 11/14/20   Martyn Ehrich, NP  Misc. Devices (TRANSFER BENCH) MISC Use daily 10/29/20   [name], MD  ondansetron (ZOFRAN ODT) 4 MG disintegrating tablet Take 1 tablet (4 mg total) by mouth every 8 (eight) hours as needed for nausea. Patient  not taking: No sig reported 02/10/21   [name], MD    Allergies    Cefepime, Kiwi extract, Morphine and related, Nalbuphine, Trental [pentoxifylline], Morphine, and Nubain [nalbuphine hcl]  Review of Systems   Review of Systems  Constitutional: Negative for fever.  HENT: Positive for congestion and sore throat.   Respiratory: Positive for cough and shortness of breath.   All other systems reviewed and are negative.   Physical Exam Updated Vital Signs BP (!) 117/101   Pulse 82   Temp 98.8 F (37.1 C) (Oral)   Resp 15   Ht 5\' 4"  (1.626 m)   Wt (!) 168 kg   SpO2 98%   BMI 63.57 kg/m   Physical Exam Vitals and nursing note reviewed.  Constitutional:      Appearance: She is well-developed.     Comments: Morbidly obese Left BKA  HENT:     Head: Normocephalic and atraumatic.  Eyes:     Conjunctiva/sclera: Conjunctivae normal.  Cardiovascular:     Rate and Rhythm: Normal rate and regular rhythm.  Pulmonary:     Effort: Pulmonary effort is normal.     Comments: Speaking in full sentences Abdominal:     General: Bowel sounds are normal.     Palpations: Abdomen is soft.     Tenderness: There is no abdominal tenderness.  Skin:    General: Skin is warm.  Neurological:     Mental Status: She is alert.  Psychiatric:  Behavior: Behavior normal.     ED Results / Procedures / Treatments   Labs (all labs ordered are listed, but only abnormal results are displayed) Labs Reviewed  RESP PANEL BY RT-PCR (FLU A&B, COVID) ARPGX2 - Abnormal; Notable for the following components:      Result Value   SARS Coronavirus 2 by RT PCR POSITIVE (*)    All other components within normal limits  BASIC METABOLIC PANEL - Abnormal; Notable for the following components:   Sodium 134 (*)    Chloride 92 (*)    Glucose, Bld 529 (*)    BUN 40 (*)    Creatinine, Ser 1.12 (*)    All other components within normal limits  CBC WITH DIFFERENTIAL/PLATELET - Abnormal; Notable for  the following components:   Hemoglobin 11.9 (*)    Abs Immature Granulocytes 0.12 (*)    All other components within normal limits  URINALYSIS, ROUTINE W REFLEX MICROSCOPIC - Abnormal; Notable for the following components:   APPearance CLOUDY (*)    Glucose, UA >=500 (*)    Hgb urine dipstick SMALL (*)    Leukocytes,Ua LARGE (*)    WBC, UA >50 (*)    Bacteria, UA MANY (*)    All other components within normal limits  CBG MONITORING, ED - Abnormal; Notable for the following components:   Glucose-Capillary 513 (*)    All other components within normal limits  I-STAT CHEM 8, ED - Abnormal; Notable for the following components:   Sodium 133 (*)    Chloride 92 (*)    BUN 38 (*)    Creatinine, Ser 1.10 (*)    Glucose, Bld 545 (*)    Calcium, Ion 1.08 (*)    All other components within normal limits  CBG MONITORING, ED - Abnormal; Notable for the following components:   Glucose-Capillary 446 (*)    All other components within normal limits  URINE CULTURE  CULTURE, BLOOD (ROUTINE X 2)  CULTURE, BLOOD (ROUTINE X 2)  LACTIC ACID, PLASMA  LACTIC ACID, PLASMA  D-DIMER, QUANTITATIVE  PROCALCITONIN  LACTATE DEHYDROGENASE  FERRITIN  TRIGLYCERIDES  FIBRINOGEN  C-REACTIVE PROTEIN  HEPATIC FUNCTION PANEL  POC URINE PREG, ED  CBG MONITORING, ED    EKG EKG Interpretation  Date/Time:  Tuesday Apr 14 2021 15:27:02 EDT Ventricular Rate:  87 PR Interval:  162 QRS Duration: 100 QT Interval:  386 QTC Calculation: 464 R Axis:   64 Text Interpretation: Normal sinus rhythm Possible Anterior infarct , age undetermined Abnormal ECG Confirmed by Dene Gentry [id number]) on 04/14/2021 5:56:21 PM   Radiology DG Chest Port 1 View  Result Date: 04/14/2021 CLINICAL DATA:  Cough and body aches.  Shortness of breath. EXAM: PORTABLE CHEST 1 VIEW COMPARISON:  Most recent radiograph 11/17/2020. Most recent CT 11/07/2020 FINDINGS: Borderline cardiomegaly. Unchanged mediastinal contours. No acute  airspace disease. Left basilar assessment is limited by soft tissue attenuation. No significant pleural effusion. No pneumothorax. Stable osseous structures IMPRESSION: Borderline cardiomegaly. No acute pulmonary process. Electronically Signed   By: Keith Rake M.D.   On: 04/14/2021 17:34    Procedures .Critical Care Performed by: Larra Crunkleton, [place] N, PA-C Authorized by: Zahrah Sutherlin, [place] N, PA-C   Critical care provider statement:    Critical care time (minutes):  45   Critical care was necessary to treat or prevent imminent or life-threatening deterioration of the following conditions:  Respiratory failure   Critical care was time spent personally by me on the following activities:  Discussions with consultants,  evaluation of patient's response to treatment, examination of patient, ordering and performing treatments and interventions, ordering and review of laboratory studies, ordering and review of radiographic studies, pulse oximetry, re-evaluation of patient's condition, obtaining history from patient or surrogate and review of old charts     Medications Ordered in ED Medications  acetaminophen (TYLENOL) tablet 650 mg (650 mg Oral Given 04/14/21 1702)  sodium chloride 0.9 % bolus 500 mL (0 mLs Intravenous Stopped 04/14/21 1802)  insulin aspart (novoLOG) injection 10 Units (10 Units Subcutaneous Given 04/14/21 2045)    ED Course  I have reviewed the triage vital signs and the nursing notes.  Pertinent labs & imaging results that were available during my care of the patient were reviewed by me and considered in my medical decision making (see chart for details).  Clinical Course as of 04/14/21 2148  Tue Apr 14, 2021  2119 Discussed antibiotic therapy for UTI with ED pharmacist.  Patient has tolerated Rocephin in the past many times, recommends this is a good option.  1 g ordered. [JR]    Clinical Course User Index [JR] Shemia Bevel, [place] N, PA-C   MDM Rules/Calculators/A&P                           Patient is a 45 year old female, vaccinated against COVID, presenting with 2 days of body aches, cough, shortness of breath.  She is also having persistent urinary symptoms after recent ED visit, treated for UTI.  Completed her week course of Cipro, symptoms did not improve.  Her pain is in her bilateral lower back and suprapubic area.  She is not having any flank pain.  States her urine is still cloudy.  She is on home oxygen as needed, has been requiring it more persistently, is satting 83% on room air here, improved on 2 L nasal cannula.  She has normal work of breathing.  She is morbidly obese.  Chest x-ray is negative.  COVID swab is positive.  She is noted to have hyperglycemia without findings consistent with DKA.  She is treated with IV fluids with improvement, additionally given insulin.  States she took 40 units of insulin prior to arrival after a large meal.  UA shows increased bacteria and leukocytes.  Recent culture showed multiple species.  Discussed with ED pharmacist, Rocephin ordered for coverage, patient has never been allergy though has tolerated Rocephin multiple times in the past.  As patient has multiple comorbidities for poor outcome with COVID-19, has new oxygen requirement, patient will need admission for acute respiratory failure in the setting of COVID-19.  Work-up and care plan discussed with attending physician Dr. Francia Greaves, who is in agreement.  Final Clinical Impression(s) / ED Diagnoses Final diagnoses:  Acute respiratory failure due to COVID-19 [facility])  Acute cystitis without hematuria    Rx / DC Orders ED Discharge Orders    None       Jatara Huettner, [place] N, PA-C 04/14/21 2335    Elizebeth Kluesner, [place] N, PA-C 04/14/21 2336    Valarie Merino, MD 04/15/21 1609

## 2021-04-14 NOTE — ED Notes (Addendum)
Nurse and PA was notified about pt's Glu level.

## 2021-04-14 NOTE — ED Triage Notes (Signed)
Congestion, cough. 97% on 2L nasal canula that she uses at home

## 2021-04-14 NOTE — ED Triage Notes (Signed)
Per EMS, Pt, from home, c/o SOB, body aches, and lack taste x 1 day.  Pt is on oxygen PRN.  CBG read "high" and Pt took insulin prior to transport.

## 2021-04-14 NOTE — ED Notes (Signed)
Pt was given a sandwich

## 2021-04-14 NOTE — ED Notes (Signed)
Pt stated she would not let the new peripheral IV placed in her right [facility] stay, pt asked to please not pull out IV. Pt educated that two peripheral IV's were necessary.

## 2021-04-15 ENCOUNTER — Ambulatory Visit: Payer: Medicaid Other | Admitting: Pharmacist

## 2021-04-15 ENCOUNTER — Other Ambulatory Visit: Payer: Self-pay

## 2021-04-15 ENCOUNTER — Encounter (HOSPITAL_COMMUNITY): Payer: Self-pay | Admitting: Family Medicine

## 2021-04-15 DIAGNOSIS — J1282 Pneumonia due to coronavirus disease 2019: Secondary | ICD-10-CM

## 2021-04-15 DIAGNOSIS — U071 COVID-19: Principal | ICD-10-CM

## 2021-04-15 DIAGNOSIS — N390 Urinary tract infection, site not specified: Secondary | ICD-10-CM

## 2021-04-15 DIAGNOSIS — G8929 Other chronic pain: Secondary | ICD-10-CM

## 2021-04-15 DIAGNOSIS — R0902 Hypoxemia: Secondary | ICD-10-CM

## 2021-04-15 HISTORY — DX: Morbid (severe) obesity due to excess calories: E66.01

## 2021-04-15 HISTORY — DX: Urinary tract infection, site not specified: N39.0

## 2021-04-15 LAB — COMPREHENSIVE METABOLIC PANEL
ALT: 31 U/L (ref 0–44)
AST: 35 U/L (ref 15–41)
Albumin: 3.1 g/dL — ABNORMAL LOW (ref 3.5–5.0)
Alkaline Phosphatase: 168 U/L — ABNORMAL HIGH (ref 38–126)
Anion gap: 13 (ref 5–15)
BUN: 47 mg/dL — ABNORMAL HIGH (ref 6–20)
CO2: 30 mmol/L (ref 22–32)
Calcium: 8.8 mg/dL — ABNORMAL LOW (ref 8.9–10.3)
Chloride: 90 mmol/L — ABNORMAL LOW (ref 98–111)
Creatinine, Ser: 1.3 mg/dL — ABNORMAL HIGH (ref 0.44–1.00)
GFR, Estimated: 52 mL/min — ABNORMAL LOW (ref >60)
Glucose, Bld: 738 mg/dL (ref 70–99)
Potassium: 5.4 mmol/L — ABNORMAL HIGH (ref 3.5–5.1)
Sodium: 133 mmol/L — ABNORMAL LOW (ref 135–145)
Total Bilirubin: 0.1 mg/dL — ABNORMAL LOW (ref 0.3–1.2)
Total Protein: 7.9 g/dL (ref 6.5–8.1)

## 2021-04-15 LAB — CBC
HCT: 35.7 % — ABNORMAL LOW (ref 36.0–46.0)
Hemoglobin: 11.3 g/dL — ABNORMAL LOW (ref 12.0–15.0)
MCH: 30.9 pg (ref 26.0–34.0)
MCHC: 31.7 g/dL (ref 30.0–36.0)
MCV: 97.5 fL (ref 80.0–100.0)
Platelets: 264 10*3/uL (ref 150–400)
RBC: 3.66 MIL/uL — ABNORMAL LOW (ref 3.87–5.11)
RDW: 14.2 % (ref 11.5–15.5)
WBC: 9 10*3/uL (ref 4.0–10.5)
nRBC: 0 % (ref 0.0–0.2)

## 2021-04-15 LAB — GLUCOSE, CAPILLARY
Glucose-Capillary: 477 mg/dL — ABNORMAL HIGH (ref 70–99)
Glucose-Capillary: >600 mg/dL (ref 70–99)
Glucose-Capillary: >600 mg/dL (ref 70–99)
Glucose-Capillary: >600 mg/dL (ref 70–99)
Glucose-Capillary: >600 mg/dL (ref 70–99)
Glucose-Capillary: >600 mg/dL (ref 70–99)
Glucose-Capillary: 534 mg/dL (ref 70–99)
Glucose-Capillary: >600 mg/dL (ref 70–99)
Glucose-Capillary: 482 mg/dL — ABNORMAL HIGH (ref 70–99)
Glucose-Capillary: 392 mg/dL — ABNORMAL HIGH (ref 70–99)
Glucose-Capillary: 361 mg/dL — ABNORMAL HIGH (ref 70–99)
Glucose-Capillary: 334 mg/dL — ABNORMAL HIGH (ref 70–99)
Glucose-Capillary: 318 mg/dL — ABNORMAL HIGH (ref 70–99)

## 2021-04-15 LAB — BASIC METABOLIC PANEL
Anion gap: 14 (ref 5–15)
Anion gap: 15 (ref 5–15)
Anion gap: 18 — ABNORMAL HIGH (ref 5–15)
BUN: 47 mg/dL — ABNORMAL HIGH (ref 6–20)
BUN: 47 mg/dL — ABNORMAL HIGH (ref 6–20)
BUN: 44 mg/dL — ABNORMAL HIGH (ref 6–20)
CO2: 30 mmol/L (ref 22–32)
CO2: 29 mmol/L (ref 22–32)
CO2: 25 mmol/L (ref 22–32)
Calcium: 8.8 mg/dL — ABNORMAL LOW (ref 8.9–10.3)
Calcium: 8.7 mg/dL — ABNORMAL LOW (ref 8.9–10.3)
Calcium: 8.8 mg/dL — ABNORMAL LOW (ref 8.9–10.3)
Chloride: 88 mmol/L — ABNORMAL LOW (ref 98–111)
Chloride: 89 mmol/L — ABNORMAL LOW (ref 98–111)
Chloride: 91 mmol/L — ABNORMAL LOW (ref 98–111)
Creatinine, Ser: 1.3 mg/dL — ABNORMAL HIGH (ref 0.44–1.00)
Creatinine, Ser: 1.37 mg/dL — ABNORMAL HIGH (ref 0.44–1.00)
Creatinine, Ser: 1.11 mg/dL — ABNORMAL HIGH (ref 0.44–1.00)
GFR, Estimated: 52 mL/min — ABNORMAL LOW (ref >60)
GFR, Estimated: 49 mL/min — ABNORMAL LOW (ref >60)
GFR, Estimated: >60 mL/min (ref >60)
Glucose, Bld: 716 mg/dL (ref 70–99)
Glucose, Bld: 696 mg/dL (ref 70–99)
Glucose, Bld: 450 mg/dL — ABNORMAL HIGH (ref 70–99)
Potassium: 4.8 mmol/L (ref 3.5–5.1)
Potassium: 4.1 mmol/L (ref 3.5–5.1)
Potassium: 4.1 mmol/L (ref 3.5–5.1)
Sodium: 132 mmol/L — ABNORMAL LOW (ref 135–145)
Sodium: 133 mmol/L — ABNORMAL LOW (ref 135–145)
Sodium: 134 mmol/L — ABNORMAL LOW (ref 135–145)

## 2021-04-15 LAB — OSMOLALITY: Osmolality: 334 mOsm/kg (ref 275–295)

## 2021-04-15 LAB — CREATININE, SERUM
Creatinine, Ser: 1.31 mg/dL — ABNORMAL HIGH (ref 0.44–1.00)
GFR, Estimated: 52 mL/min — ABNORMAL LOW (ref >60)

## 2021-04-15 LAB — PROCALCITONIN: Procalcitonin: 0.14 ng/mL

## 2021-04-15 LAB — BRAIN NATRIURETIC PEPTIDE: B Natriuretic Peptide: 27.7 pg/mL (ref 0.0–100.0)

## 2021-04-15 LAB — LACTIC ACID, PLASMA: Lactic Acid, Venous: 1.9 mmol/L (ref 0.5–1.9)

## 2021-04-15 LAB — CBG MONITORING, ED: Glucose-Capillary: 345 mg/dL — ABNORMAL HIGH (ref 70–99)

## 2021-04-15 LAB — C-REACTIVE PROTEIN: CRP: 8.7 mg/dL — ABNORMAL HIGH (ref <1.0)

## 2021-04-15 MED ORDER — HYDROCODONE-ACETAMINOPHEN 10-325 MG PO TABS
1.0000 | ORAL_TABLET | Freq: Once | ORAL | Status: AC
Start: 2021-04-15 — End: 2021-04-15
  Administered 2021-04-15: 1 via ORAL
  Filled 2021-04-15: qty 1

## 2021-04-15 MED ORDER — TIOTROPIUM BROMIDE MONOHYDRATE 1.25 MCG/ACT IN AERS: 2.0000 | INHALATION_SPRAY | Freq: Every day | RESPIRATORY_TRACT | Status: DC

## 2021-04-15 MED ORDER — PANTOPRAZOLE SODIUM 40 MG PO TBEC
40.0000 mg | DELAYED_RELEASE_TABLET | Freq: Every day | ORAL | Status: DC
  Administered 2021-04-15 – 2021-04-18 (×4): 40 mg via ORAL
  Filled 2021-04-15 (×4): qty 1

## 2021-04-15 MED ORDER — SODIUM CHLORIDE 0.9 % IV SOLN
1.0000 mg/kg | Freq: Two times a day (BID) | INTRAVENOUS | Status: DC
  Administered 2021-04-15 – 2021-04-16 (×3): 168.125 mg via INTRAVENOUS
  Filled 2021-04-15 (×4): qty 2.69

## 2021-04-15 MED ORDER — MOMETASONE FURO-FORMOTEROL FUM 200-5 MCG/ACT IN AERO
2.0000 | INHALATION_SPRAY | Freq: Two times a day (BID) | RESPIRATORY_TRACT | Status: DC
  Administered 2021-04-15 – 2021-04-16 (×3): 2 via RESPIRATORY_TRACT
  Filled 2021-04-15: qty 8.8

## 2021-04-15 MED ORDER — CHLORHEXIDINE GLUCONATE CLOTH 2 % EX PADS
6.0000 | MEDICATED_PAD | Freq: Every day | CUTANEOUS | Status: DC
  Administered 2021-04-15 – 2021-04-18 (×4): 6 via TOPICAL

## 2021-04-15 MED ORDER — OXYCODONE HCL 5 MG PO TABS
10.0000 mg | ORAL_TABLET | ORAL | Status: DC | PRN
  Administered 2021-04-15 – 2021-04-18 (×12): 10 mg via ORAL
  Filled 2021-04-15 (×12): qty 2

## 2021-04-15 MED ORDER — INSULIN GLARGINE 100 UNIT/ML ~~LOC~~ SOLN
90.0000 [IU] | Freq: Two times a day (BID) | SUBCUTANEOUS | Status: DC
  Administered 2021-04-15: 90 [IU] via SUBCUTANEOUS
  Filled 2021-04-15 (×2): qty 0.9

## 2021-04-15 MED ORDER — GABAPENTIN 300 MG PO CAPS: 600.0000 mg | ORAL_CAPSULE | Freq: Once | ORAL | Status: AC

## 2021-04-15 MED ORDER — CHLORHEXIDINE GLUCONATE 0.12 % MT SOLN
15.0000 mL | Freq: Two times a day (BID) | OROMUCOSAL | Status: DC
  Administered 2021-04-15 – 2021-04-18 (×6): 15 mL via OROMUCOSAL
  Filled 2021-04-15 (×6): qty 15

## 2021-04-15 MED ORDER — ENOXAPARIN SODIUM 80 MG/0.8ML IJ SOSY
80.0000 mg | PREFILLED_SYRINGE | INTRAMUSCULAR | Status: DC
  Administered 2021-04-15 – 2021-04-18 (×4): 80 mg via SUBCUTANEOUS
  Filled 2021-04-15 (×4): qty 0.8

## 2021-04-15 MED ORDER — PREDNISONE 20 MG PO TABS: 50.0000 mg | ORAL_TABLET | Freq: Every day | ORAL | Status: DC

## 2021-04-15 MED ORDER — DULAGLUTIDE 1.5 MG/0.5ML ~~LOC~~ SOAJ: 1.5000 mg | SUBCUTANEOUS | Status: DC

## 2021-04-15 MED ORDER — DEXTROSE 50 % IV SOLN
0.0000 mL | INTRAVENOUS | Status: DC | PRN
Start: 2021-04-15 — End: 2021-04-18

## 2021-04-15 MED ORDER — INSULIN DEGLUDEC 200 UNIT/ML ~~LOC~~ SOPN: 90.0000 [IU] | PEN_INJECTOR | Freq: Two times a day (BID) | SUBCUTANEOUS | Status: DC

## 2021-04-15 MED ORDER — ONDANSETRON HCL 4 MG/2ML IJ SOLN: 4.0000 mg | Freq: Four times a day (QID) | INTRAMUSCULAR | Status: DC | PRN

## 2021-04-15 MED ORDER — INSULIN REGULAR(HUMAN) IN NACL 100-0.9 UT/100ML-% IV SOLN
INTRAVENOUS | Status: DC
  Administered 2021-04-15 (×2): 19 [IU]/h via INTRAVENOUS
  Administered 2021-04-15: 30 [IU]/h via INTRAVENOUS
  Administered 2021-04-16: 16 [IU]/h via INTRAVENOUS
  Filled 2021-04-15 (×4): qty 100

## 2021-04-15 MED ORDER — ROPINIROLE HCL 1 MG PO TABS
1.0000 mg | ORAL_TABLET | Freq: Every day | ORAL | Status: DC
  Administered 2021-04-15 – 2021-04-17 (×4): 1 mg via ORAL
  Filled 2021-04-15 (×4): qty 1

## 2021-04-15 MED ORDER — DEXTROSE IN LACTATED RINGERS 5 % IV SOLN: INTRAVENOUS | Status: DC

## 2021-04-15 MED ORDER — ATORVASTATIN CALCIUM 40 MG PO TABS
80.0000 mg | ORAL_TABLET | Freq: Every day | ORAL | Status: DC
  Administered 2021-04-15 – 2021-04-18 (×4): 80 mg via ORAL
  Filled 2021-04-15 (×4): qty 2

## 2021-04-15 MED ORDER — ONDANSETRON HCL 4 MG PO TABS
4.0000 mg | ORAL_TABLET | Freq: Four times a day (QID) | ORAL | Status: DC | PRN
  Administered 2021-04-17 (×2): 4 mg via ORAL
  Filled 2021-04-15 (×3): qty 1

## 2021-04-15 MED ORDER — GUAIFENESIN-DM 100-10 MG/5ML PO SYRP
10.0000 mL | ORAL_SOLUTION | ORAL | Status: DC | PRN
  Filled 2021-04-15 (×3): qty 10

## 2021-04-15 MED ORDER — UMECLIDINIUM BROMIDE 62.5 MCG/INH IN AEPB
1.0000 | INHALATION_SPRAY | Freq: Every day | RESPIRATORY_TRACT | Status: DC
  Administered 2021-04-15 – 2021-04-16 (×2): 1 via RESPIRATORY_TRACT
  Filled 2021-04-15: qty 7

## 2021-04-15 MED ORDER — NITROFURANTOIN MONOHYD MACRO 100 MG PO CAPS
100.0000 mg | ORAL_CAPSULE | Freq: Two times a day (BID) | ORAL | Status: DC
  Administered 2021-04-15 – 2021-04-18 (×7): 100 mg via ORAL
  Filled 2021-04-15 (×7): qty 1

## 2021-04-15 MED ORDER — ACETAMINOPHEN 325 MG PO TABS
650.0000 mg | ORAL_TABLET | Freq: Four times a day (QID) | ORAL | Status: DC | PRN
  Administered 2021-04-15: 650 mg via ORAL
  Filled 2021-04-15: qty 2

## 2021-04-15 MED ORDER — INSULIN ASPART 100 UNIT/ML IJ SOLN: 0.0000 [IU] | Freq: Every day | INTRAMUSCULAR | Status: DC

## 2021-04-15 MED ORDER — HYDROCODONE-ACETAMINOPHEN 10-325 MG PO TABS
1.0000 | ORAL_TABLET | Freq: Three times a day (TID) | ORAL | Status: DC | PRN
  Administered 2021-04-15 (×2): 1 via ORAL
  Filled 2021-04-15 (×2): qty 1

## 2021-04-15 MED ORDER — METOPROLOL SUCCINATE ER 25 MG PO TB24
25.0000 mg | ORAL_TABLET | Freq: Every day | ORAL | Status: DC
  Administered 2021-04-15 – 2021-04-18 (×3): 25 mg via ORAL
  Filled 2021-04-15 (×4): qty 1

## 2021-04-15 MED ORDER — INSULIN GLARGINE 100 UNIT/ML ~~LOC~~ SOLN
120.0000 [IU] | Freq: Once | SUBCUTANEOUS | Status: AC
  Administered 2021-04-15: 120 [IU] via SUBCUTANEOUS
  Filled 2021-04-15: qty 1.2

## 2021-04-15 MED ORDER — TORSEMIDE 20 MG PO TABS
80.0000 mg | ORAL_TABLET | Freq: Every day | ORAL | Status: DC
  Administered 2021-04-15 – 2021-04-18 (×4): 80 mg via ORAL
  Filled 2021-04-15 (×4): qty 4

## 2021-04-15 MED ORDER — LACTATED RINGERS IV SOLN: INTRAVENOUS | Status: DC

## 2021-04-15 MED ORDER — ASPIRIN EC 81 MG PO TBEC
81.0000 mg | DELAYED_RELEASE_TABLET | Freq: Every day | ORAL | Status: DC
  Administered 2021-04-15 – 2021-04-18 (×4): 81 mg via ORAL
  Filled 2021-04-15 (×4): qty 1

## 2021-04-15 MED ORDER — INSULIN GLARGINE 100 UNIT/ML ~~LOC~~ SOLN
120.0000 [IU] | Freq: Two times a day (BID) | SUBCUTANEOUS | Status: DC
  Filled 2021-04-15: qty 1.2

## 2021-04-15 MED ORDER — GABAPENTIN 400 MG PO CAPS
1200.0000 mg | ORAL_CAPSULE | Freq: Once | ORAL | Status: DC
  Filled 2021-04-15: qty 4

## 2021-04-15 MED ORDER — GABAPENTIN 300 MG PO CAPS
600.0000 mg | ORAL_CAPSULE | Freq: Three times a day (TID) | ORAL | Status: DC
  Administered 2021-04-15 – 2021-04-18 (×13): 600 mg via ORAL
  Filled 2021-04-15 (×13): qty 2

## 2021-04-15 MED ORDER — INSULIN ASPART 100 UNIT/ML IJ SOLN
0.0000 [IU] | Freq: Three times a day (TID) | INTRAMUSCULAR | Status: DC
  Administered 2021-04-15: 20 [IU] via SUBCUTANEOUS

## 2021-04-15 MED ORDER — HYDRALAZINE HCL 10 MG PO TABS
10.0000 mg | ORAL_TABLET | Freq: Three times a day (TID) | ORAL | Status: DC | PRN
  Administered 2021-04-16: 10 mg via ORAL
  Filled 2021-04-15: qty 1

## 2021-04-15 MED ORDER — ORAL CARE MOUTH RINSE
15.0000 mL | Freq: Two times a day (BID) | OROMUCOSAL | Status: DC
  Administered 2021-04-16 – 2021-04-18 (×3): 15 mL via OROMUCOSAL

## 2021-04-15 MED ORDER — INSULIN ASPART 100 UNIT/ML IJ SOLN
6.0000 [IU] | Freq: Three times a day (TID) | INTRAMUSCULAR | Status: DC
  Administered 2021-04-15: 6 [IU] via SUBCUTANEOUS

## 2021-04-15 NOTE — Progress Notes (Signed)
Critical lab value notification from lab: CBG 738. Dr. Florencia Reasons notified. Transfer orders to ICU stepdown for insulin drip. [name]

## 2021-04-15 NOTE — Progress Notes (Addendum)
Patient is admitted with symptomatic COVID infection with dry cough, loss of taste, nasal congestion and runny nose with body aches, 02 sats on room air less than 94 , cxr no acute infiltrate, initial crp 10.7, lactic acid 2.1, procalcitonin 0.22 She is started on remdesivir/steroid, repeat CRP, lactic acid and procalcitonin trending down, reports breathing better She reports she has been vaccinated for COVID and has had her booster shot. However her blood glucose elevated to 738, she is transferred to stepdown started on insulin drip, plan to quick taper down from iv solumedrol if crp continue to come down. She was treated for UTI prior to hospitalization, she currently denies urinary symptom, although UA showed many bacteria, continue Macrobid for now, waiting for final urine culture result.

## 2021-04-15 NOTE — ED Notes (Signed)
ED TO INPATIENT HANDOFF REPORT  Name/Age/Gender [name] 45 y.o. female  Code Status Code Status History    Date Active Date Inactive Code Status Order ID Comments User Context   11/26/2020 1016 11/26/2020 1806 Full Code [id number]  Sherren Mocha, MD Inpatient   10/09/2020 0815 10/10/2020 1659 Full Code [id number]  Cleophas Dunker, DO ED   06/17/2020 1906 06/23/2020 2057 Full Code [id number]  Patriciaann Clan, DO Inpatient   06/14/2020 0610 06/16/2020 0012 Full Code [id number]  Etta Quill, DO ED   05/17/2019 1545 05/25/2019 1919 Full Code [id number]  Guadalupe Dawn, MD Inpatient   12/05/2018 1511 12/06/2018 1614 Full Code [id number]  Rory Percy, DO Inpatient   10/29/2018 1845 10/29/2018 2240 Full Code [id number]  Sela Hilding, MD ED   03/06/2018 1637 03/16/2018 1427 Full Code [id number]  Cathlyn Parsons, PA-C Inpatient   03/06/2018 1637 03/06/2018 1637 Full Code [id number]  Cathlyn Parsons, PA-C Inpatient   03/03/2018 1423 03/06/2018 1447 Full Code [id number]  [name], MD Inpatient   01/27/2018 0829 02/03/2018 1641 Full Code [id number]  Bufford Lope, DO ED   10/29/2017 0535 10/30/2017 2106 Full Code [id number]  Nuala Alpha, DO ED   11/03/2016 1627 11/05/2016 1425 Full Code [id number]  Ethelene Hal, NP Inpatient   11/03/2016 1626 11/03/2016 1627 Full Code [id number]  Linard Millers, MD Inpatient   10/30/2016 0119 11/03/2016 1544 Full Code [id number]  Lily Kocher, MD Inpatient   07/12/2014 2249 07/14/2014 1606 Full Code [id number]  Street, Sharon Mt, MD Inpatient   06/27/2012 0555 06/27/2012 1640 Full Code [id number]  Charna Archer, RN Inpatient   Advance Care Planning Activity    Questions for Most Recent Historical Code Status (Order [id number])       Home/SNF/Other Home  Chief Complaint Pneumonia due to COVID-19 virus [U07.1, J12.82]  Level of Care/Admitting Diagnosis ED Disposition    ED Disposition Condition [place]:  [facility] [[id number]]  Level of Care: Med-Surg [16]  May admit patient to [facility] or [facility] if equivalent level of care is available:: Yes  Covid Evaluation: Confirmed COVID Positive  Diagnosis: Pneumonia due to COVID-19 virus [id number]  Admitting Physician: Eben Burow [id number]  Attending Physician: Eben Burow [id number]  Estimated length of stay: past midnight tomorrow  Certification:: I certify this patient will need inpatient services for at least 2 midnights       Medical History Past Medical History:  Diagnosis Date  . Acute osteomyelitis of ankle or foot, left ([place]) 01/31/2018  . Alveolar hypoventilation   . Anemia    not on iron pill  . Asthma   . Bipolar 2 disorder ([place])   . Carpal tunnel syndrome on right    recurrent  . Cellulitis 08/2010-08/2011  . Chronic pain   . COPD (chronic obstructive pulmonary disease) ([name])    Symbicort daily and Proventil as needed  . Costochondritis   . Diabetes mellitus type II, uncontrolled ([name]) 2000   Type 2, Uncontrolled.Takes Lantus daily.Fasting blood sugar runs 150  . Drug-seeking behavior   . GERD (gastroesophageal reflux disease)    takes Pantoprazole and Zantac daily  . HLD (hyperlipidemia)    takes Atorvastatin daily  . Hypertension    takes Lisinopril and Coreg daily  . Morbid obesity ([place])   . Nocturia   . OSA on CPAP   . Peripheral neuropathy    takes Gabapentin daily  . Pneumonia    "  walking" several yrs ago and as a baby (12/05/2018)  . Rectal fissure   . Restless leg   . SVT (supraventricular tachycardia) ([place])   . Syncope 02/25/2016  . Urinary frequency   . Urinary incontinence 10/23/2020  . Varicose veins    Right medial thigh and Left leg     Allergies Allergies  Allergen Reactions  . Cefepime Other (See Comments)    AKI, see records from [facility] hospitalization in January 2020.   Other reaction(s): Other (See Comments)  Note pt has tolerated Rocephin and  Keflex  . Kiwi Extract Shortness Of Breath, Swelling and Anaphylaxis  . Morphine And Related Nausea And Vomiting  . Nalbuphine     Other reaction(s): Hallucinations, Other (See Comments) "FEELS LIKE SOMETHING CRAWLING ON ME" Reaction: Nervousness "FEELS LIKE SOMETHING CRAWLING ON ME" Reaction: Nervousness Reaction: Nervousness "FEELS LIKE SOMETHING CRAWLING ON ME"   . Trental [Pentoxifylline] Nausea And Vomiting  . Morphine Nausea And Vomiting  . Nubain [Nalbuphine Hcl] Other (See Comments)    "FEELS LIKE SOMETHING CRAWLING ON ME"    IV Location/Drains/Wounds Patient Lines/Drains/Airways Status    Active Line/Drains/Airways    Name Placement date Placement time Site Days   Peripheral IV 04/14/21 Left Antecubital 04/14/21  1701  Antecubital  1   Incision (Closed) 05/17/19 Buttocks Other (Comment) 05/17/19  1355  -- 699   Wound / Incision (Open or Dehisced) 05/19/19 Buttocks Left 05/19/19  1330  Buttocks  697   Wound / Incision (Open or Dehisced) 10/09/20 Diabetic ulcer Foot Right;Mid Small round diabetic ulcer 10/09/20  1715  Foot  188          Labs/Imaging Results for orders placed or performed during the hospital encounter of 04/14/21 (from the past 48 hour(s))  POC CBG, ED     Status: Abnormal   Collection Time: 04/14/21  4:21 PM  Result Value Ref Range   Glucose-Capillary 513 (HH) 70 - 99 mg/dL    Comment: Glucose reference range applies only to samples taken after fasting for at least 8 hours.  Basic metabolic panel     Status: Abnormal   Collection Time: 04/14/21  4:23 PM  Result Value Ref Range   Sodium 134 (L) 135 - 145 mmol/L   Potassium 4.2 3.5 - 5.1 mmol/L   Chloride 92 (L) 98 - 111 mmol/L   CO2 32 22 - 32 mmol/L   Glucose, Bld 529 (HH) 70 - 99 mg/dL    Comment: Glucose reference range applies only to samples taken after fasting for at least 8 hours. CRITICAL RESULT CALLED TO, READ BACK BY AND VERIFIED WITH: C.JACKMAN, RN AT 1833 ON 05.10.22 BY  N.THOMPSON    BUN 40 (H) 6 - 20 mg/dL   Creatinine, Ser 1.12 (H) 0.44 - 1.00 mg/dL   Calcium 8.9 8.9 - 10.3 mg/dL   GFR, Estimated >60 >60 mL/min    Comment: (NOTE) Calculated using the CKD-EPI Creatinine Equation (2021)    Anion gap 10 5 - 15    Comment: Performed at [facility], [place] [address] [place], [place] [address]  CBC with Differential     Status: Abnormal   Collection Time: 04/14/21  4:23 PM  Result Value Ref Range   WBC 6.6 4.0 - 10.5 K/uL   RBC 3.94 3.87 - 5.11 MIL/uL   Hemoglobin 11.9 (L) 12.0 - 15.0 g/dL   HCT 38.1 36.0 - 46.0 %   MCV 96.7 80.0 - 100.0 fL   MCH 30.2 26.0 -  34.0 pg   MCHC 31.2 30.0 - 36.0 g/dL   RDW 14.1 11.5 - 15.5 %   Platelets 284 150 - 400 K/uL   nRBC 0.0 0.0 - 0.2 %   Neutrophils Relative % 52 %   Neutro Abs 3.4 1.7 - 7.7 K/uL   Lymphocytes Relative 33 %   Lymphs Abs 2.2 0.7 - 4.0 K/uL   Monocytes Relative 11 %   Monocytes Absolute 0.7 0.1 - 1.0 K/uL   Eosinophils Relative 1 %   Eosinophils Absolute 0.1 0.0 - 0.5 K/uL   Basophils Relative 1 %   Basophils Absolute 0.0 0.0 - 0.1 K/uL   Immature Granulocytes 2 %   Abs Immature Granulocytes 0.12 (H) 0.00 - 0.07 K/uL    Comment: Performed at [facility], [place] [address], [place], [place] [address]  Resp Panel by RT-PCR (Flu A&B, Covid) Nasopharyngeal Swab     Status: Abnormal   Collection Time: 04/14/21  4:24 PM   Specimen: Nasopharyngeal Swab; Nasopharyngeal(NP) swabs in vial transport medium  Result Value Ref Range   SARS Coronavirus 2 by RT PCR POSITIVE (A) NEGATIVE    Comment: RESULT CALLED TO, READ BACK BY AND VERIFIED WITH: B.Maddilyn Campus, RN AT 1916 ON 05.10.22 BY N.THOMPSON (NOTE) SARS-CoV-2 target nucleic acids are DETECTED.  The SARS-CoV-2 RNA is generally detectable in upper respiratory specimens during the acute phase of infection. Positive results are indicative of the presence of the identified virus, but do not rule out bacterial infection  or co-infection with other pathogens not detected by the test. Clinical correlation with patient history and other diagnostic information is necessary to determine patient infection status. The expected result is Negative.  Fact Sheet for Patients: EntrepreneurPulse.com.au  Fact Sheet for Healthcare Providers: IncredibleEmployment.be  This test is not yet approved or cleared by the [place] FDA and  has been authorized for detection and/or diagnosis of SARS-CoV-2 by FDA under an Emergency Use Authorization (EUA).  This EUA will remain in effect (meaning thi s test can be used) for the duration of  the COVID-19 declaration under Section 564(b)(1) of the Act, 21 U.S.C. section 360bbb-3(b)(1), unless the authorization is terminated or revoked sooner.     Influenza A by PCR NEGATIVE NEGATIVE   Influenza B by PCR NEGATIVE NEGATIVE    Comment: (NOTE) The Xpert Xpress SARS-CoV-2/FLU/RSV plus assay is intended as an aid in the diagnosis of influenza from Nasopharyngeal swab specimens and should not be used as a sole basis for treatment. Nasal washings and aspirates are unacceptable for Xpert Xpress SARS-CoV-2/FLU/RSV testing.  Fact Sheet for Patients: EntrepreneurPulse.com.au  Fact Sheet for Healthcare Providers: IncredibleEmployment.be  This test is not yet approved or cleared by the [place] FDA and has been authorized for detection and/or diagnosis of SARS-CoV-2 by FDA under an Emergency Use Authorization (EUA). This EUA will remain in effect (meaning this test can be used) for the duration of the COVID-19 declaration under Section 564(b)(1) of the Act, 21 U.S.C. section 360bbb-3(b)(1), unless the authorization is terminated or revoked.  Performed at [facility], [place] [address], [place], [place] [address]   Urinalysis, Routine w reflex microscopic     Status: Abnormal    Collection Time: 04/14/21  4:25 PM  Result Value Ref Range   Color, Urine YELLOW YELLOW   APPearance CLOUDY (A) CLEAR   Specific Gravity, Urine 1.012 1.005 - 1.030   pH 6.0 5.0 - 8.0   Glucose, UA >=500 (A) NEGATIVE mg/dL  Hgb urine dipstick SMALL (A) NEGATIVE   Bilirubin Urine NEGATIVE NEGATIVE   Ketones, ur NEGATIVE NEGATIVE mg/dL   Protein, ur NEGATIVE NEGATIVE mg/dL   Nitrite NEGATIVE NEGATIVE   Leukocytes,Ua LARGE (A) NEGATIVE   RBC / HPF 6-10 0 - 5 RBC/hpf   WBC, UA >50 (H) 0 - 5 WBC/hpf   Bacteria, UA MANY (A) NONE SEEN   WBC Clumps PRESENT    Mucus PRESENT     Comment: Performed at [facility], [place] [address] [place], [place] [address]  POC urine preg, ED     Status: None   Collection Time: 04/14/21  4:41 PM  Result Value Ref Range   Preg Test, Ur NEGATIVE NEGATIVE    Comment:        THE SENSITIVITY OF THIS METHODOLOGY IS >24 mIU/mL   I-stat chem 8, ED (not at [facility] or [facility])     Status: Abnormal   Collection Time: 04/14/21  5:15 PM  Result Value Ref Range   Sodium 133 (L) 135 - 145 mmol/L   Potassium 4.1 3.5 - 5.1 mmol/L   Chloride 92 (L) 98 - 111 mmol/L   BUN 38 (H) 6 - 20 mg/dL   Creatinine, Ser 1.10 (H) 0.44 - 1.00 mg/dL   Glucose, Bld 545 (HH) 70 - 99 mg/dL    Comment: Glucose reference range applies only to samples taken after fasting for at least 8 hours.   Calcium, Ion 1.08 (L) 1.15 - 1.40 mmol/L   TCO2 31 22 - 32 mmol/L   Hemoglobin 12.6 12.0 - 15.0 g/dL   HCT 37.0 36.0 - 46.0 %   Comment NOTIFIED PHYSICIAN   CBG monitoring, ED     Status: Abnormal   Collection Time: 04/14/21  7:18 PM  Result Value Ref Range   Glucose-Capillary 446 (H) 70 - 99 mg/dL    Comment: Glucose reference range applies only to samples taken after fasting for at least 8 hours.  Lactic acid, plasma     Status: Abnormal   Collection Time: 04/14/21  9:45 PM  Result Value Ref Range   Lactic Acid, Venous 2.1 (HH) 0.5 - 1.9 mmol/L    Comment: CRITICAL RESULT  CALLED TO, READ BACK BY AND VERIFIED WITH: SHANNON MULDOON @ [id number] ON 04/14/21 C VARNER Performed at [facility], [place] [address], [place], [place] [address]   D-dimer, quantitative     Status: Abnormal   Collection Time: 04/14/21  9:45 PM  Result Value Ref Range   D-Dimer, Quant 0.70 (H) 0.00 - 0.50 ug/mL-FEU    Comment: (NOTE) At the manufacturer cut-off value of 0.5 g/mL FEU, this assay has a negative predictive value of 95-100%.This assay is intended for use in conjunction with a clinical pretest probability (PTP) assessment model to exclude pulmonary embolism (PE) and deep venous thrombosis (DVT) in outpatients suspected of PE or DVT. Results should be correlated with clinical presentation. Performed at [facility], [place] [address] [place], [place] [address]   Procalcitonin     Status: None   Collection Time: 04/14/21  9:45 PM  Result Value Ref Range   Procalcitonin 0.22 ng/mL    Comment:        Interpretation: PCT (Procalcitonin) <= 0.5 ng/mL: Systemic infection (sepsis) is not likely. Local bacterial infection is possible. (NOTE)       Sepsis PCT Algorithm           Lower Respiratory Tract  Infection PCT Algorithm    ----------------------------     ----------------------------         PCT < 0.25 ng/mL                PCT < 0.10 ng/mL          Strongly encourage             Strongly discourage   discontinuation of antibiotics    initiation of antibiotics    ----------------------------     -----------------------------       PCT 0.25 - 0.50 ng/mL            PCT 0.10 - 0.25 ng/mL               OR       >80% decrease in PCT            Discourage initiation of                                            antibiotics      Encourage discontinuation           of antibiotics    ----------------------------     -----------------------------         PCT >= 0.50 ng/mL              PCT 0.26 - 0.50 ng/mL                AND        <80% decrease in PCT             Encourage initiation of                                             antibiotics       Encourage continuation           of antibiotics    ----------------------------     -----------------------------        PCT >= 0.50 ng/mL                  PCT > 0.50 ng/mL               AND         increase in PCT                  Strongly encourage                                      initiation of antibiotics    Strongly encourage escalation           of antibiotics                                     -----------------------------                                           PCT <= 0.25 ng/mL  OR                                        > 80% decrease in PCT                                      Discontinue / Do not initiate                                             antibiotics  Performed at [place] [address] [place], [place] [address]   Lactate dehydrogenase     Status: Abnormal   Collection Time: 04/14/21  9:45 PM  Result Value Ref Range   LDH 211 (H) 98 - 192 U/L    Comment: Performed at [facility], [place] [address], [place], [place] [address]  Ferritin     Status: None   Collection Time: 04/14/21  9:45 PM  Result Value Ref Range   Ferritin 170 11 - 307 ng/mL    Comment: Performed at [facility], [place] [address] [place], [place] [address]  Triglycerides     Status: Abnormal   Collection Time: 04/14/21  9:45 PM  Result Value Ref Range   Triglycerides 282 (H) <150 mg/dL    Comment: Performed at [facility], [place] [address], [place], [place] [address]  Fibrinogen     Status: Abnormal   Collection Time: 04/14/21  9:45 PM  Result Value Ref Range   Fibrinogen 733 (H) 210 - 475 mg/dL    Comment: Performed at [facility], [place] [address] [place], [place] [address]  C-reactive protein      Status: Abnormal   Collection Time: 04/14/21  9:45 PM  Result Value Ref Range   CRP 10.7 (H) <1.0 mg/dL    Comment: Performed at [facility], [place] [address] [place], [place] [address]  Hepatic function panel     Status: Abnormal   Collection Time: 04/14/21  9:45 PM  Result Value Ref Range   Total Protein 8.2 (H) 6.5 - 8.1 g/dL   Albumin 3.0 (L) 3.5 - 5.0 g/dL   AST 26 15 - 41 U/L   ALT 27 0 - 44 U/L   Alkaline Phosphatase 153 (H) 38 - 126 U/L   Total Bilirubin 0.4 0.3 - 1.2 mg/dL   Bilirubin, Direct 0.1 0.0 - 0.2 mg/dL   Indirect Bilirubin 0.3 0.3 - 0.9 mg/dL    Comment: Performed at [facility], [place] [address], [place],  [address]   *Note: Due to a large number of results and/or encounters for the requested time period, some results have not been displayed. A complete set of results can be found in Results Review.   DG Chest Port 1 View  Result Date: 04/14/2021 CLINICAL DATA:  Cough and body aches.  Shortness of breath. EXAM: PORTABLE CHEST 1 VIEW COMPARISON:  Most recent radiograph 11/17/2020. Most recent CT 11/07/2020 FINDINGS: Borderline cardiomegaly. Unchanged mediastinal contours. No acute airspace disease. Left basilar assessment is limited by soft tissue attenuation. No significant pleural effusion. No pneumothorax. Stable osseous structures IMPRESSION: Borderline cardiomegaly. No acute pulmonary process. Electronically Signed  By: Keith Rake M.D.   On: 04/14/2021 17:34    Pending Labs Unresulted Labs (From admission, onward)          Start     Ordered   04/14/21 2109  Lactic acid, plasma  Now then every 2 hours,   STAT      04/14/21 2109   04/14/21 2109  Blood Culture (routine x 2)  BLOOD CULTURE X 2,   STAT      04/14/21 2109   04/14/21 1942  Urine culture  Add-on,   AD        04/14/21 1941          Vitals/Pain Today's Vitals   04/14/21 2130 04/14/21 2201 04/14/21 2300 04/14/21 2338  BP:  106/67  112/66   Pulse: 82 87 84 84  Resp: 16 15 14 16   Temp:    98.2 F (36.8 C)  TempSrc:      SpO2: 99% 100% 100% 97%  Weight:      Height:      PainSc:        Isolation Precautions Airborne and Contact precautions  Medications Medications  remdesivir 100 mg in sodium chloride 0.9 % 100 mL IVPB (has no administration in time range)    Followed by  remdesivir 100 mg in sodium chloride 0.9 % 100 mL IVPB (has no administration in time range)  HYDROcodone-acetaminophen (NORCO) 10-325 MG per tablet 1 tablet (has no administration in time range)  gabapentin (NEURONTIN) capsule 1,200 mg (has no administration in time range)  acetaminophen (TYLENOL) tablet 650 mg (650 mg Oral Given 04/14/21 1702)  sodium chloride 0.9 % bolus 500 mL (0 mLs Intravenous Stopped 04/14/21 1802)  insulin aspart (novoLOG) injection 10 Units (10 Units Subcutaneous Given 04/14/21 2045)    Mobility non-ambulatory

## 2021-04-15 NOTE — Progress Notes (Signed)
Lab called a critical glucose of 696. Pt currently on a insulin drip. Dr. Erlinda Hong notified of critical.

## 2021-04-15 NOTE — H&P (Signed)
History and Physical    [name] [id number] DOB: Jun 09, 1976 DOA: 04/14/2021  PCP: [name], MD   Patient coming from:  Home  Chief Complaint: SOB, dysuria  HPI: [name] is a 45 y.o. female with medical history significant for DMT2, morbid obesity, HTN, HLD, COPD, chronic pain who presents for evaluation of SOB and cough. She reports that symptoms began yesterday with a dry cough, loss of taste, nasal congestion and runny nose with body aches.  Shortness of breath that is exacerbated with exertion.  She is on 2 L of oxygen nasal cannula at home as needed.  Was found to have oxygen saturations in the low 80s on room air when she arrived emergency room.  She was placed on 2 L oxygen nasal cannula.  She reports she has been vaccinated for COVID and has had her booster shot.  She has no known exposures to anyone with COVID.  She states she is compliant with her medications but her blood sugars are generally above 200 she states.  Was seen in the ER a week ago for dysuria and urinary frequency and was diagnosed with urinary tract infection and treated with Cipro for a week.  She completed the antibiotics but continues to have dysuria and frequency. She denies tobacco alcohol or illicit drug use  ED Course: [name] is found to be COVID positive in the emergency room.  She is requiring oxygen.  Is positive for UTI.  WBC 6600, hemoglobin 11.9, hematocrit 30.1 platelets 284, sodium 133, potassium 4.1, creatinine 1.10 with a BUN of 38.  Alk phos is 153.  AST and ALT are normal.  CRP 10.7, lactic acid 2.1, procalcitonin 0.22, D-dimer 0.70 with a fibrinogen 733.  Patient was started on remdesivir in the emergency room  Review of Systems:  General: Denies fever, chills, weight loss, night sweats.  Denies dizziness.  Denies change in appetite HENT: Denies head trauma, headache, denies change in hearing, tinnitus.  Denies nasal bleeding.  Denies sore throat, sores in mouth.  Denies  difficulty swallowing Eyes: Denies blurry vision, pain in eye, drainage.  Denies discoloration of eyes. Neck: Denies pain.  Denies swelling.  Denies pain with movement. Cardiovascular: Denies chest pain, palpitations.  Denies edema.  Denies orthopnea Respiratory: Reports shortness of breath, cough.  Denies wheezing.  Denies sputum production Gastrointestinal: Denies abdominal pain, swelling. Denies nausea, vomiting, diarrhea. Denies melena.  Denies hematemesis. Musculoskeletal: Denies limitation of movement.  Denies deformity or swelling.  Denies pain.  Denies arthralgias or myalgias. Genitourinary: Denies pelvic pain.  Reports urinary frequency and dysuria.  Skin: Denies rash.  Denies petechiae, purpura, ecchymosis. Neurological: Denies headache.  Denies syncope. Denies seizure activity. Denies weakness or paresthesia.  Denies slurred speech, drooping face.  Denies visual change. Psychiatric: Denies depression, anxiety. Denies hallucinations.  Past Medical History:  Diagnosis Date  . Acute osteomyelitis of ankle or foot, left ([facility]) 01/31/2018  . Alveolar hypoventilation   . Anemia    not on iron pill  . Asthma   . Bipolar 2 disorder ([place])   . Carpal tunnel syndrome on right    recurrent  . Cellulitis 08/2010-08/2011  . Chronic pain   . COPD (chronic obstructive pulmonary disease) ([name])    Symbicort daily and Proventil as needed  . Costochondritis   . Diabetes mellitus type II, uncontrolled ([place]) 2000   Type 2, Uncontrolled.Takes Lantus daily.Fasting blood sugar runs 150  . Drug-seeking behavior   . GERD (gastroesophageal reflux disease)  takes Pantoprazole and Zantac daily  . HLD (hyperlipidemia)    takes Atorvastatin daily  . Hypertension    takes Lisinopril and Coreg daily  . Morbid obesity ([place])   . Nocturia   . OSA on CPAP   . Peripheral neuropathy    takes Gabapentin daily  . Pneumonia    "walking" several yrs ago and as a baby (12/05/2018)  . Rectal fissure   .  Restless leg   . SVT (supraventricular tachycardia) ([place])   . Syncope 02/25/2016  . Urinary frequency   . Urinary incontinence 10/23/2020  . Varicose veins    Right medial thigh and Left leg     Past Surgical History:  Procedure Laterality Date  . AMPUTATION Left 02/01/2018   Procedure: LEFT FOURTH AND 5TH TOE RAY AMPUTATION;  Surgeon: [name], MD;  Location: [place];  Service: Orthopedics;  Laterality: Left;  . AMPUTATION Left 03/03/2018   Procedure: LEFT BELOW KNEE AMPUTATION;  Surgeon: [name], MD;  Location: [place];  Service: Orthopedics;  Laterality: Left;  . CARPAL TUNNEL RELEASE Bilateral   . CESAREAN SECTION  2007  . CORONARY ANGIOGRAPHY N/A 11/26/2020   Procedure: CORONARY ANGIOGRAPHY;  Surgeon: Sherren Mocha, MD;  Location: [facility];  Service: Cardiovascular;  Laterality: N/A;  . INCISION AND DRAINAGE PERIRECTAL ABSCESS Left 05/18/2019   Procedure: IRRIGATION AND DEBRIDEMENT OF PANNIS ABSCESS, POSSIBLE DEBRIDEMENT OF BUTTOCK WOUND;  Surgeon: Donnie Mesa, MD;  Location: [place];  Service: General;  Laterality: Left;  . IRRIGATION AND DEBRIDEMENT BUTTOCKS Left 05/17/2019   Procedure: IRRIGATION AND DEBRIDEMENT BUTTOCKS;  Surgeon: Donnie Mesa, MD;  Location: [place];  Service: General;  Laterality: Left;  . KNEE ARTHROSCOPY Right 07/17/2010  . LEFT HEART CATHETERIZATION WITH CORONARY ANGIOGRAM N/A 07/27/2012   Procedure: LEFT HEART CATHETERIZATION WITH CORONARY ANGIOGRAM;  Surgeon: Sherren Mocha, MD;  Location: [facility];  Service: Cardiovascular;  Laterality: N/A;  . MASS EXCISION N/A 06/29/2013   Procedure:  WIDE LOCAL EXCISION OF POSTERIOR NECK ABSCESS;  Surgeon: Ralene Ok, MD;  Location: [place];  Service: General;  Laterality: N/A;  . REPAIR KNEE LIGAMENT Left    "fixed ligaments and chipped patella"  . right transmetatarsal amputation       Social History  reports that she quit smoking about 15 years ago. Her smoking use included cigarettes. She  has a 3.75 pack-year smoking history. She has never used smokeless tobacco. She reports previous drug use. She reports that she does not drink alcohol.  Allergies  Allergen Reactions  . Cefepime Other (See Comments)    AKI, see records from [place] hospitalization in January 2020.   Other reaction(s): Other (See Comments)  Note pt has tolerated Rocephin and Keflex  . Kiwi Extract Shortness Of Breath, Swelling and Anaphylaxis  . Morphine And Related Nausea And Vomiting  . Nalbuphine     Other reaction(s): Hallucinations, Other (See Comments) "FEELS LIKE SOMETHING CRAWLING ON ME" Reaction: Nervousness "FEELS LIKE SOMETHING CRAWLING ON ME" Reaction: Nervousness Reaction: Nervousness "FEELS LIKE SOMETHING CRAWLING ON ME"   . Trental [Pentoxifylline] Nausea And Vomiting  . Morphine Nausea And Vomiting  . Nubain [Nalbuphine Hcl] Other (See Comments)    "FEELS LIKE SOMETHING CRAWLING ON ME"    Family History  Problem Relation Age of Onset  . Diabetes Mother   . Hyperlipidemia Mother   . Depression Mother   . GER disease Mother   . Allergic rhinitis Mother   . Restless legs syndrome  Mother   . Heart attack Paternal Uncle   . Heart disease Paternal Grandmother   . Heart attack Paternal Grandmother   . Heart attack Paternal Grandfather   . Heart disease Paternal Grandfather   . Heart attack Father   . Migraines Sister   . Cancer Maternal Grandmother        COLON  . Hypertension Maternal Grandmother   . Hyperlipidemia Maternal Grandmother   . Diabetes Maternal Grandmother   . Other Maternal Grandfather        GUN SHOT  . Anxiety disorder Sister   . Asthma Child      Prior to Admission medications   Medication Sig Start Date End Date Taking? Authorizing Provider  aspirin EC 81 MG tablet Take 1 tablet (81 mg total) by mouth daily. Swallow whole. 11/19/20  Yes Sherren Mocha, MD  atorvastatin (LIPITOR) 80 MG tablet Take 1 tablet (80 mg total) by mouth daily. 02/11/21  Yes  [name], MD  budesonide-formoterol [facility]) 160-4.5 MCG/ACT inhaler Inhale 2 puffs into the lungs 2 (two) times daily. 05/09/20  Yes [name], MD  Cholecalciferol 1000 units tablet Take 1 tablet (1,000 Units total) by mouth daily. 04/05/17  Yes [name], MD  Dulaglutide (TRULICITY) 1.5 PY/1.9JK SOPN Inject 1.5 mg into the skin once a week. 03/25/21  Yes [name], MD  gabapentin (NEURONTIN) 600 MG tablet TAKE 2 TABLETS BY MOUTH 3 TIMES DAILY 03/23/21  Yes [name], MD  HYDROcodone-acetaminophen (NORCO) 10-325 MG tablet Take 1 tablet by mouth every 8 (eight) hours as needed for moderate pain. 03/31/21  Yes [name], MD  insulin aspart (NOVOLOG) 100 UNIT/ML FlexPen Inject 40 Units into the skin 3 (three) times daily with meals. Patient taking differently: Inject 45 Units into the skin 3 (three) times daily with meals. 02/10/21  Yes [name], MD  insulin degludec (TRESIBA FLEXTOUCH) 200 UNIT/ML FlexTouch Pen Inject 110 Units into the skin 2 (two) times daily. Patient taking differently: Inject 90 Units into the skin 2 (two) times daily. 03/25/21  Yes [name], MD  metoprolol succinate (TOPROL-XL) 25 MG 24 hr tablet Take 25 mg by mouth daily. 10/23/20  Yes [provider]  naproxen (NAPROSYN) 500 MG tablet Take 1 tablet (500 mg total) by mouth 2 (two) times daily as needed for moderate pain. 04/03/21  Yes Petrucelli, Samantha R, PA-C  omeprazole (PRILOSEC) 40 MG capsule Take 1 capsule (40 mg total) by mouth daily. 11/14/20  Yes Martyn Ehrich, NP  phenazopyridine (PYRIDIUM) 200 MG tablet Take 1 tablet (200 mg total) by mouth 3 (three) times daily as needed for pain. 04/03/21  Yes Petrucelli, Samantha R, PA-C  potassium chloride SA (KLOR-CON M20) 20 MEQ tablet Take 1 tablet (20 mEq total) by mouth daily. 09/09/20  Yes Weaver, Scott T, PA-C  rOPINIRole (REQUIP) 1 MG tablet Take 1 tablet (1 mg total) by mouth  at bedtime. 08/12/20  Yes [name], MD  Tiotropium Bromide Monohydrate (SPIRIVA RESPIMAT) 1.25 MCG/ACT AERS Inhale 2 puffs into the lungs daily. 11/14/20  Yes Martyn Ehrich, NP  torsemide (DEMADEX) 20 MG tablet TAKE 4 TABLETS (80 MG) BY MOUTH ONCE A DAY 02/27/21  Yes Lind Covert, MD  Accu-Chek FastClix Lancets MISC USE DAILY AS INSTRUCTED 03/26/21   Martyn Malay, MD  ACCU-CHEK GUIDE test strip USE T0 CHECK BLOOD SUGAR 3 TIMES DAILY 01/08/21   [name], MD  Accu-Chek Softclix Lancets  lancets Use to check blood glucose four times daily 04/17/20   Martyn Malay, MD  ciprofloxacin (CIPRO) 500 MG tablet Take 1 tablet (500 mg total) by mouth every 12 (twelve) hours. Patient not taking: No sig reported 04/03/21   Petrucelli, Aldona Bar R, PA-C  Compression Bandages MISC Wear daily for swelling 07/10/20   [name], MD  Continuous Blood Gluc Sensor (DEXCOM G6 SENSOR) MISC Inject 1 applicator into the skin as directed. Change sensor every 10 days. 10/16/20   Leavy Cella, RPH-CPP  Continuous Blood Gluc Transmit (DEXCOM G6 TRANSMITTER) MISC Inject 1 Device into the skin as directed. Reuse 8 times with sensor changes. 10/16/20   Leavy Cella, RPH-CPP  Ferrous Sulfate (IRON) 325 (65 Fe) MG TABS Take 1 tablet (325 mg total) by mouth every other day. 03/31/21   [name], MD  HYDROcodone-acetaminophen (NORCO) 10-325 MG tablet Take 1 tablet by mouth every 8 (eight) hours as needed for moderate pain. 03/31/21   [name], MD  HYDROcodone-acetaminophen (NORCO) 10-325 MG tablet Take 1 tablet by mouth every 8 (eight) hours as needed for moderate pain. 03/31/21   [name], MD  Insulin Pen Needle (SURE COMFORT PEN NEEDLES) 32G X 4 MM MISC Use to inject insulin as indicated 04/17/20   Martyn Malay, MD  ipratropium-albuterol (DUONEB) 0.5-2.5 (3) MG/3ML SOLN Take 3 mLs by nebulization every 4 (four) hours as needed. Patient not taking: No sig  reported 06/25/20   [name], MD  loratadine (CLARITIN) 10 MG tablet Take 1 tablet (10 mg total) by mouth daily. Patient not taking: No sig reported 11/14/20   Martyn Ehrich, NP  Misc. Devices (TRANSFER BENCH) MISC Use daily 10/29/20   [name], MD  ondansetron (ZOFRAN ODT) 4 MG disintegrating tablet Take 1 tablet (4 mg total) by mouth every 8 (eight) hours as needed for nausea. Patient not taking: No sig reported 02/10/21   [name], MD    Physical Exam: Vitals:   04/14/21 2201 04/14/21 2300 04/14/21 2338 04/15/21 0112  BP: 106/67  112/66 107/73  Pulse: 87 84 84 84  Resp: '15 14 16 20  ' Temp:   98.2 F (36.8 C) 97.8 F (36.6 C)  TempSrc:    Oral  SpO2: 100% 100% 97% 96%  Weight:      Height:        Constitutional: NAD, calm, comfortable Vitals:   04/14/21 2201 04/14/21 2300 04/14/21 2338 04/15/21 0112  BP: 106/67  112/66 107/73  Pulse: 87 84 84 84  Resp: '15 14 16 20  ' Temp:   98.2 F (36.8 C) 97.8 F (36.6 C)  TempSrc:    Oral  SpO2: 100% 100% 97% 96%  Weight:      Height:       General: WDWN, Alert and oriented x3.  Eyes: EOMI, PERRL, conjunctivae normal.  Sclera nonicteric HENT:  [place]/AT, external ears normal.  Nares patent without epistasis.  Mucous membranes are moist.   Neck: Soft, normal range of motion, supple, no masses, Trachea midline Respiratory: Breath sounds with diffuse scattered rales. no wheezing, no crackles. Normal respiratory effort. No accessory muscle use.  Cardiovascular: Regular rate and rhythm, no murmurs / rubs / gallops. No extremity edema.  Abdomen: Soft, no tenderness, nondistended, no rebound or guarding.  Morbidly obese.  No masses palpated. Bowel sounds normoactive Musculoskeletal: FROM. no cyanosis. No joint deformity upper and lower extremities.  Left BKA.  Right foot with amputation  of toes. Normal muscle tone.  Skin: Warm, dry, intact no rashes, lesions, ulcers. No induration Neurologic: CN 2-12 grossly  intact.  Normal speech.  Sensation intact, patella DTR +1 bilaterally. Strength 5/5 in all extremities.   Psychiatric: Normal judgment and insight.  Normal mood.    Labs on Admission: I have personally reviewed following labs and imaging studies  CBC: Recent Labs  Lab 04/14/21 1623 04/14/21 1715  WBC 6.6  --   NEUTROABS 3.4  --   HGB 11.9* 12.6  HCT 38.1 37.0  MCV 96.7  --   PLT 284  --     Basic Metabolic Panel: Recent Labs  Lab 04/14/21 1623 04/14/21 1715  NA 134* 133*  K 4.2 4.1  CL 92* 92*  CO2 32  --   GLUCOSE 529* 545*  BUN 40* 38*  CREATININE 1.12* 1.10*  CALCIUM 8.9  --     GFR: Estimated Creatinine Clearance: 104.1 mL/min (A) (by C-G formula based on SCr of 1.1 mg/dL (H)).  Liver Function Tests: Recent Labs  Lab 04/14/21 2145  AST 26  ALT 27  ALKPHOS 153*  BILITOT 0.4  PROT 8.2*  ALBUMIN 3.0*    Urine analysis:    Component Value Date/Time   COLORURINE YELLOW 04/14/2021 1625   APPEARANCEUR CLOUDY (A) 04/14/2021 1625   LABSPEC 1.012 04/14/2021 1625   PHURINE 6.0 04/14/2021 1625   GLUCOSEU >=500 (A) 04/14/2021 1625   HGBUR SMALL (A) 04/14/2021 1625   BILIRUBINUR NEGATIVE 04/14/2021 1625   BILIRUBINUR negative 11/26/2019 0955   KETONESUR NEGATIVE 04/14/2021 1625   PROTEINUR NEGATIVE 04/14/2021 1625   UROBILINOGEN 0.2 11/26/2019 0955   UROBILINOGEN 0.2 08/22/2015 0225   NITRITE NEGATIVE 04/14/2021 1625   LEUKOCYTESUR LARGE (A) 04/14/2021 1625    Radiological Exams on Admission: DG Chest Port 1 View  Result Date: 04/14/2021 CLINICAL DATA:  Cough and body aches.  Shortness of breath. EXAM: PORTABLE CHEST 1 VIEW COMPARISON:  Most recent radiograph 11/17/2020. Most recent CT 11/07/2020 FINDINGS: Borderline cardiomegaly. Unchanged mediastinal contours. No acute airspace disease. Left basilar assessment is limited by soft tissue attenuation. No significant pleural effusion. No pneumothorax. Stable osseous structures IMPRESSION: Borderline  cardiomegaly. No acute pulmonary process. Electronically Signed   By: Keith Rake M.D.   On: 04/14/2021 17:34    EKG: Independently reviewed.  EKG shows normal sinus rhythm with no acute ST elevation or depression.  QTc 464  Assessment/Plan     Covid pneumonia Started on remdesivir Supplemental oxygen provided keep O2 sat between 92 to 96% Albuterol MDI every 4 hours as needed for wheezing cough, shortness of breath Antitussives provided Incentive spirometer every 1-2 hours while awake  Active Problems:   Hypoxia Supplemental oxygen as needed to keep O2 sat between 92 to 96%    DM (diabetes mellitus) type II uncontrolled, periph vascular disorder Continue home regimen with [place] and Trulicity.  Monitor blood sugars with meals and at bedtime.  Sliding scale insulin provide as needed for glycemic control.  Check hemoglobin A1c    UTI (urinary tract infection) Patient with positive urinalysis and urinary symptoms of dysuria.  Patient had a negative urine culture a week ago and was treated with 1 week of Cipro.  We will treat with Macrobid.    Hypertension Continue metoprolol.  Monitor blood pressure    HLD (hyperlipidemia) On Lipitor.    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[place])   Morbid obesity with BMI of 60.0-69.9, adult ([name])   Chronic pain Continue home pain medication  regimen.    DVT prophylaxis: Lovenox for DVT prophylaxis Code Status:   Full code Family Communication:  Diagnosis and plan discussed with patient.  She verbalized understanding agrees with plan.  Further recommendation follow as clinically indicated Disposition Plan:   Patient is from:  Home  Anticipated DC to:  Home  Anticipated DC date:  Anticipate 2 midnight or more stay in the hospital  Anticipated DC barriers: No barriers to discharge identified   Admission status:  Inpatient  Yevonne Aline Jassiel Flye MD [facility]  How to contact the [facility] Attending or Consulting provider [place] or covering  provider during after hours [place], for this patient?   1. Check the care team in [facility] and look for a) attending/consulting [facility] provider listed and b) the [facility] team listed 2. Log into [email or web address] and use [place]'s universal password to access. If you do not have the password, please contact the hospital operator. 3. Locate the [facility] provider you are looking for under [facility] and page to a number that you can be directly reached. 4. If you still have difficulty reaching the provider, please page the [facility] (Director on Call) for the Hospitalists listed on amion for assistance.  04/15/2021, 1:21 AM

## 2021-04-15 NOTE — Plan of Care (Signed)
  Problem: Coping: Goal: Level of anxiety will decrease Reactivated   Problem: Nutrition: Goal: Adequate nutrition will be maintained Reactivated

## 2021-04-15 NOTE — Progress Notes (Signed)
Inpatient Diabetes Program Recommendations  AACE/ADA: New Consensus Statement on Inpatient Glycemic Control (2015)  Target Ranges:  Prepandial:   less than 140 mg/dL      Peak postprandial:   less than 180 mg/dL (1-2 hours)      Critically ill patients:  140 - 180 mg/dL   Lab Results  Component Value Date   GLUCAP 477 (H) 04/15/2021   HGBA1C >15.5 (H) 10/09/2020    Review of Glycemic Control Results for [name], [name] (MRN [id number]) as of 04/15/2021 09:11  Ref. Range 04/14/2021 16:21 04/14/2021 19:18 04/14/2021 22:08 04/15/2021 07:47  Glucose-Capillary Latest Ref Range: 70 - 99 mg/dL 513 (HH) 446 (H) 345 (H) 477 (H)   Diabetes history:  DM2 Outpatient Diabetes medications:  Tresiba 90 units BID Novolog 45 units TID  Trulicity 1.5 mg every Sunday Current orders for Inpatient glycemic control:  Lantus 120 units BID Novolog 0-20 units TID & 0-5 units QHS Novolog 6 units TID with meals Solumedrol 168.125 mg Q12H  Spoke with patient over the phone to confirm home diabetes regimen.  When I told her who I was she stated, "Oh man, I hate talking to you guys.  You drill me and I am sick of it".  I explained I was not going to drill her and just wanted to confirm her home medications as Dr. Shirlyn Goltz note from February reads U-500 was started.  She said the U-500 made her sick; she states she takes above home diabetes medications.    Explained her blood sugars will be more elevated with steroids and insulins adjustments will be necessary.  She verbalizes understanding.    Current A1C is pending.   Will continue to follow while inpatient.  Thank you, Reche Dixon, RN, BSN Diabetes Coordinator Inpatient Diabetes Program [phone] (team pager from 8a-5p)

## 2021-04-15 NOTE — Progress Notes (Signed)
Patient insists that she gets 1200 mg Gabapentin 3 times a day. PCP was notified.

## 2021-04-16 LAB — BASIC METABOLIC PANEL
Anion gap: 10 (ref 5–15)
Anion gap: 11 (ref 5–15)
Anion gap: 13 (ref 5–15)
Anion gap: 13 (ref 5–15)
Anion gap: 10 (ref 5–15)
BUN: 42 mg/dL — ABNORMAL HIGH (ref 6–20)
BUN: 44 mg/dL — ABNORMAL HIGH (ref 6–20)
BUN: 47 mg/dL — ABNORMAL HIGH (ref 6–20)
BUN: 46 mg/dL — ABNORMAL HIGH (ref 6–20)
BUN: 48 mg/dL — ABNORMAL HIGH (ref 6–20)
CO2: 33 mmol/L — ABNORMAL HIGH (ref 22–32)
CO2: 32 mmol/L (ref 22–32)
CO2: 33 mmol/L — ABNORMAL HIGH (ref 22–32)
CO2: 33 mmol/L — ABNORMAL HIGH (ref 22–32)
CO2: 36 mmol/L — ABNORMAL HIGH (ref 22–32)
Calcium: 8.8 mg/dL — ABNORMAL LOW (ref 8.9–10.3)
Calcium: 8.8 mg/dL — ABNORMAL LOW (ref 8.9–10.3)
Calcium: 8.8 mg/dL — ABNORMAL LOW (ref 8.9–10.3)
Calcium: 8.6 mg/dL — ABNORMAL LOW (ref 8.9–10.3)
Calcium: 8.5 mg/dL — ABNORMAL LOW (ref 8.9–10.3)
Chloride: 93 mmol/L — ABNORMAL LOW (ref 98–111)
Chloride: 92 mmol/L — ABNORMAL LOW (ref 98–111)
Chloride: 90 mmol/L — ABNORMAL LOW (ref 98–111)
Chloride: 88 mmol/L — ABNORMAL LOW (ref 98–111)
Chloride: 91 mmol/L — ABNORMAL LOW (ref 98–111)
Creatinine, Ser: 1.02 mg/dL — ABNORMAL HIGH (ref 0.44–1.00)
Creatinine, Ser: 1.1 mg/dL — ABNORMAL HIGH (ref 0.44–1.00)
Creatinine, Ser: 1.11 mg/dL — ABNORMAL HIGH (ref 0.44–1.00)
Creatinine, Ser: 1.12 mg/dL — ABNORMAL HIGH (ref 0.44–1.00)
Creatinine, Ser: 1.15 mg/dL — ABNORMAL HIGH (ref 0.44–1.00)
GFR, Estimated: >60 mL/min (ref >60)
GFR, Estimated: >60 mL/min (ref >60)
GFR, Estimated: >60 mL/min (ref >60)
GFR, Estimated: >60 mL/min (ref >60)
GFR, Estimated: >60 mL/min (ref >60)
Glucose, Bld: 205 mg/dL — ABNORMAL HIGH (ref 70–99)
Glucose, Bld: 380 mg/dL — ABNORMAL HIGH (ref 70–99)
Glucose, Bld: 451 mg/dL — ABNORMAL HIGH (ref 70–99)
Glucose, Bld: 371 mg/dL — ABNORMAL HIGH (ref 70–99)
Glucose, Bld: 373 mg/dL — ABNORMAL HIGH (ref 70–99)
Potassium: 4.5 mmol/L (ref 3.5–5.1)
Potassium: 4.5 mmol/L (ref 3.5–5.1)
Potassium: 4 mmol/L (ref 3.5–5.1)
Potassium: 3.8 mmol/L (ref 3.5–5.1)
Potassium: 3.8 mmol/L (ref 3.5–5.1)
Sodium: 136 mmol/L (ref 135–145)
Sodium: 135 mmol/L (ref 135–145)
Sodium: 136 mmol/L (ref 135–145)
Sodium: 134 mmol/L — ABNORMAL LOW (ref 135–145)
Sodium: 137 mmol/L (ref 135–145)

## 2021-04-16 LAB — BLOOD CULTURE ID PANEL (REFLEXED) - BCID2

## 2021-04-16 LAB — HEMOGLOBIN A1C
Hgb A1c MFr Bld: >15.5 % — ABNORMAL HIGH (ref 4.8–5.6)
Hgb A1c MFr Bld: >15.5 % — ABNORMAL HIGH (ref 4.8–5.6)
Mean Plasma Glucose: >398 mg/dL
Mean Plasma Glucose: >398 mg/dL

## 2021-04-16 LAB — URINE CULTURE

## 2021-04-16 LAB — GLUCOSE, CAPILLARY
Glucose-Capillary: 173 mg/dL — ABNORMAL HIGH (ref 70–99)
Glucose-Capillary: 195 mg/dL — ABNORMAL HIGH (ref 70–99)
Glucose-Capillary: 198 mg/dL — ABNORMAL HIGH (ref 70–99)
Glucose-Capillary: 206 mg/dL — ABNORMAL HIGH (ref 70–99)
Glucose-Capillary: 218 mg/dL — ABNORMAL HIGH (ref 70–99)
Glucose-Capillary: 232 mg/dL — ABNORMAL HIGH (ref 70–99)
Glucose-Capillary: 340 mg/dL — ABNORMAL HIGH (ref 70–99)
Glucose-Capillary: 357 mg/dL — ABNORMAL HIGH (ref 70–99)
Glucose-Capillary: 409 mg/dL — ABNORMAL HIGH (ref 70–99)

## 2021-04-16 LAB — BRAIN NATRIURETIC PEPTIDE: B Natriuretic Peptide: 56.6 pg/mL (ref 0.0–100.0)

## 2021-04-16 LAB — CBC WITH DIFFERENTIAL/PLATELET
Abs Immature Granulocytes: 0.12 10*3/uL — ABNORMAL HIGH (ref 0.00–0.07)
Basophils Absolute: 0 10*3/uL (ref 0.0–0.1)
Basophils Relative: 0 %
Eosinophils Absolute: 0 10*3/uL (ref 0.0–0.5)
Eosinophils Relative: 0 %
HCT: 37.8 % (ref 36.0–46.0)
Hemoglobin: 11.9 g/dL — ABNORMAL LOW (ref 12.0–15.0)
Immature Granulocytes: 1 %
Lymphocytes Relative: 18 %
Lymphs Abs: 1.9 10*3/uL (ref 0.7–4.0)
MCH: 30 pg (ref 26.0–34.0)
MCHC: 31.5 g/dL (ref 30.0–36.0)
MCV: 95.2 fL (ref 80.0–100.0)
Monocytes Absolute: 0.8 10*3/uL (ref 0.1–1.0)
Monocytes Relative: 7 %
Neutro Abs: 8 10*3/uL — ABNORMAL HIGH (ref 1.7–7.7)
Neutrophils Relative %: 74 %
Platelets: 307 10*3/uL (ref 150–400)
RBC: 3.97 MIL/uL (ref 3.87–5.11)
RDW: 14 % (ref 11.5–15.5)
WBC: 10.8 10*3/uL — ABNORMAL HIGH (ref 4.0–10.5)
nRBC: 0 % (ref 0.0–0.2)

## 2021-04-16 LAB — COMPREHENSIVE METABOLIC PANEL
ALT: 27 U/L (ref 0–44)
AST: 19 U/L (ref 15–41)
Albumin: 2.9 g/dL — ABNORMAL LOW (ref 3.5–5.0)
Alkaline Phosphatase: 132 U/L — ABNORMAL HIGH (ref 38–126)
Anion gap: 10 (ref 5–15)
BUN: 44 mg/dL — ABNORMAL HIGH (ref 6–20)
CO2: 33 mmol/L — ABNORMAL HIGH (ref 22–32)
Calcium: 8.8 mg/dL — ABNORMAL LOW (ref 8.9–10.3)
Chloride: 95 mmol/L — ABNORMAL LOW (ref 98–111)
Creatinine, Ser: 1.17 mg/dL — ABNORMAL HIGH (ref 0.44–1.00)
GFR, Estimated: 59 mL/min — ABNORMAL LOW (ref >60)
Glucose, Bld: 206 mg/dL — ABNORMAL HIGH (ref 70–99)
Potassium: 3.8 mmol/L (ref 3.5–5.1)
Sodium: 138 mmol/L (ref 135–145)
Total Bilirubin: 0.3 mg/dL (ref 0.3–1.2)
Total Protein: 8.1 g/dL (ref 6.5–8.1)

## 2021-04-16 LAB — D-DIMER, QUANTITATIVE: D-Dimer, Quant: 0.71 ug/mL-FEU — ABNORMAL HIGH (ref 0.00–0.50)

## 2021-04-16 LAB — PROCALCITONIN: Procalcitonin: 0.17 ng/mL

## 2021-04-16 LAB — C-REACTIVE PROTEIN: CRP: 6.7 mg/dL — ABNORMAL HIGH (ref <1.0)

## 2021-04-16 LAB — MRSA PCR SCREENING: MRSA by PCR: NEGATIVE

## 2021-04-16 MED ORDER — LACTATED RINGERS IV SOLN: INTRAVENOUS | Status: DC

## 2021-04-16 MED ORDER — DEXTROSE IN LACTATED RINGERS 5 % IV SOLN: INTRAVENOUS | Status: DC

## 2021-04-16 MED ORDER — INSULIN ASPART 100 UNIT/ML ~~LOC~~ SOLN
45.0000 [IU] | Freq: Three times a day (TID) | SUBCUTANEOUS | Status: DC
  Administered 2021-04-16: 45 [IU] via SUBCUTANEOUS

## 2021-04-16 MED ORDER — INSULIN ASPART 100 UNIT/ML ~~LOC~~ SOLN
50.0000 [IU] | Freq: Three times a day (TID) | SUBCUTANEOUS | Status: DC
  Administered 2021-04-16 – 2021-04-17 (×2): 50 [IU] via SUBCUTANEOUS

## 2021-04-16 MED ORDER — INSULIN ASPART 100 UNIT/ML IJ SOLN
0.0000 [IU] | Freq: Every day | INTRAMUSCULAR | Status: DC
  Administered 2021-04-16: 4 [IU] via SUBCUTANEOUS

## 2021-04-16 MED ORDER — INSULIN GLARGINE 100 UNIT/ML ~~LOC~~ SOLN
90.0000 [IU] | Freq: Two times a day (BID) | SUBCUTANEOUS | Status: DC
  Administered 2021-04-16: 90 [IU] via SUBCUTANEOUS
  Filled 2021-04-16 (×2): qty 0.9

## 2021-04-16 MED ORDER — PREDNISONE 20 MG PO TABS
50.0000 mg | ORAL_TABLET | Freq: Every day | ORAL | Status: DC
  Administered 2021-04-17: 50 mg via ORAL
  Filled 2021-04-16: qty 2

## 2021-04-16 MED ORDER — INSULIN REGULAR(HUMAN) IN NACL 100-0.9 UT/100ML-% IV SOLN
INTRAVENOUS | Status: AC
  Filled 2021-04-16: qty 100

## 2021-04-16 MED ORDER — INSULIN GLARGINE 100 UNIT/ML ~~LOC~~ SOLN
110.0000 [IU] | Freq: Two times a day (BID) | SUBCUTANEOUS | Status: DC
  Administered 2021-04-16: 110 [IU] via SUBCUTANEOUS
  Filled 2021-04-16 (×2): qty 1.1

## 2021-04-16 MED ORDER — ZINC SULFATE 220 (50 ZN) MG PO CAPS
220.0000 mg | ORAL_CAPSULE | Freq: Every day | ORAL | Status: DC
  Administered 2021-04-16 – 2021-04-18 (×3): 220 mg via ORAL
  Filled 2021-04-16 (×3): qty 1

## 2021-04-16 MED ORDER — INSULIN ASPART 100 UNIT/ML IJ SOLN
0.0000 [IU] | Freq: Three times a day (TID) | INTRAMUSCULAR | Status: DC
  Administered 2021-04-16: 20 [IU] via SUBCUTANEOUS
  Administered 2021-04-16: 4 [IU] via SUBCUTANEOUS
  Administered 2021-04-16: 20 [IU] via SUBCUTANEOUS
  Administered 2021-04-17: 3 [IU] via SUBCUTANEOUS

## 2021-04-16 MED ORDER — ASCORBIC ACID 500 MG PO TABS
250.0000 mg | ORAL_TABLET | Freq: Every day | ORAL | Status: DC
  Administered 2021-04-16 – 2021-04-18 (×3): 250 mg via ORAL
  Filled 2021-04-16 (×3): qty 1

## 2021-04-16 MED ORDER — INSULIN GLARGINE 100 UNIT/ML ~~LOC~~ SOLN
10.0000 [IU] | Freq: Every day | SUBCUTANEOUS | Status: DC
  Administered 2021-04-16: 10 [IU] via SUBCUTANEOUS
  Filled 2021-04-16 (×2): qty 0.1

## 2021-04-16 NOTE — Progress Notes (Signed)
PROGRESS NOTE    [name]  [id number] DOB: 05/19/1976 DOA: 04/14/2021 PCP: [name], MD    Chief Complaint  Patient presents with  . Shortness of Breath  . Generalized Body Aches    Brief Narrative:  [name] is a 45 y.o. female with medical history significant for DMT2, morbid obesity, HTN, HLD, COPD, chronic pain who presents for evaluation of SOB and cough, she is admitted for symptomatic COVID infection  She reports she has been vaccinated for COVID and has had her booster shot She developed hyperglycemia likely due to steroid use, she was put on insulin drip and transferred to stepdown  Slowly improving  Subjective:   She is transitioned off insulin drip, she wants to go home RN noticed her O2 drop to the 80s on room air, o2 improves with 2liter oxygen supplement Significant other at bedside  Assessment & Plan:   Principal Problem:   Pneumonia due to COVID-19 virus Active Problems:   DM (diabetes mellitus) type II uncontrolled, periph vascular disorder ([place])   Hypertension   HLD (hyperlipidemia)   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[place])   Morbid obesity with BMI of 60.0-69.9, adult ([place])   Hypoxia   UTI (urinary tract infection)   Chronic pain   COVID infection/acute on chronic hypoxic respiratory failure Presented with shortness of breath, cough, increased oxygen requirement -COVID test: PCR positive on admission -Chest imaging: Chest x-ray on admission no acute infiltrate  -Treatment: Currently on IV remdesivir plan for total of 5 days.  Initially started on IV steroids, currently on oral prednisone -Progression: Feels better  -Supportive care: Vitamin C, Zinc, PRN inhalers,  -Encouraged incentive spirometry, prone position, out of bed and early mobilization as much as possible -Continue airborne/contact isolation precautions  -inflammatory markers trending down, wean steroids, wean oxygen   Insulin-dependent type 2 diabetes, uncontrolled with  hyperglycemia -Patient is on Tresiba 90 units twice a day, NovoLog meal coverage 45 unit 3 times daily, also use Trulicity weekly She wears Dexcom for blood glucose monitoring -A1c greater than 15.5 -On presentation her blood glucose in the 500, after he started steroid treatment blood glucose went up to 700, she is transferred to stepdown started on insulin drip -Follow-up glucose better controlled today, off insulin drip, start Lantus 90 units twice a day, meal coverage NovoLog 45 units 3 times daily, plan to taper prednisone quickly if inflammatory markers continue come down  Hypertension On metoprolol and prn hydralazine  History of peripheral vascular disease Status post left BKA, status post right partial foot amputation  Body mass index is 63.46 kg/m.       Unresulted Labs (From admission, onward)          Start     Ordered   04/22/21 0500  Creatinine, serum  (enoxaparin (LOVENOX)    CrCl >/= 30 ml/min)  Weekly,   R     Comments: while on enoxaparin therapy    04/15/21 0125   04/16/21 0500  CBC with Differential/Platelet  Daily,   R      04/15/21 0125   04/16/21 0500  Comprehensive metabolic panel  Daily,   R      04/15/21 0125   04/16/21 0500  D-dimer, quantitative  Daily,   R      04/15/21 0125   04/16/21 0500  C-reactive protein  Daily,   R      04/15/21 0125   04/16/21 0500  Procalcitonin  Daily,   R  04/15/21 0830   04/16/21 0500  Brain natriuretic peptide  Daily,   R      04/15/21 0830   04/16/21 0413  Hemoglobin A1c  Add-on,   AD       Comments: To assess prior glycemic control   Question:  Specimen collection method  Answer:  Lab=Lab collect   04/16/21 0412   04/16/21 9323  Basic metabolic panel  (Hyperglycemic Hyperosmolar State (HHS))  STAT Now then every 4 hours ,   STAT     Question:  Specimen collection method  Answer:  Lab=Lab collect   04/16/21 0047   04/15/21 1345  MRSA PCR Screening  Once,   R        04/15/21 1344   04/15/21 1121   Hemoglobin A1c  (Hyperglycemic Hyperosmolar State (HHS))  Once,   R       Comments: To assess prior glycemic control.    04/15/21 1123   04/15/21 0126  Hemoglobin A1c  Once,   R       Comments: To assess prior glycemic control    04/15/21 0125            DVT prophylaxis: lovenox   Code Status:DNR Family Communication: significant other at bedside, daughter over the phone Disposition:   Status is: Inpatient  Dispo: The patient is from: home               Anticipated d/c is to: home              Anticipated d/c date is: on 5/14                 Consultants:   none  Procedures:   none  Antimicrobials:   Anti-infectives (From admission, onward)   Start     Dose/Rate Route Frequency Ordered Stop   04/15/21 1600  remdesivir 100 mg in sodium chloride 0.9 % 100 mL IVPB       "Followed by" Linked Group Details   100 mg 200 mL/hr over 30 Minutes Intravenous Daily 04/14/21 2333 04/19/21 0959   04/15/21 1000  nitrofurantoin (macrocrystal-monohydrate) (MACROBID) capsule 100 mg        100 mg Oral Every 12 hours 04/15/21 0825     04/15/21 0000  remdesivir 100 mg in sodium chloride 0.9 % 100 mL IVPB       "Followed by" Linked Group Details   100 mg 200 mL/hr over 30 Minutes Intravenous Every 30 min 04/14/21 2333 04/15/21 0854          Objective: Vitals:   04/16/21 0338 04/16/21 0400 04/16/21 0500 04/16/21 0755  BP: 140/77     Pulse:  73 75   Resp:  11 15   Temp:   97.8 F (36.6 C) (!) 97.5 F (36.4 C)  TempSrc:   Oral Oral  SpO2:  95% 97%   Weight:      Height:        Intake/Output Summary (Last 24 hours) at 04/16/2021 1009 Last data filed at 04/16/2021 0539 Gross per 24 hour  Intake 2432.59 ml  Output 5100 ml  Net -2667.41 ml   Filed Weights   04/14/21 1550 04/15/21 1400  Weight: (!) 168 kg (!) 167.7 kg    Examination:  General exam: calm, NAD, obese  Respiratory system: Clear to auscultation. Respiratory effort normal. Cardiovascular system: S1 &  S2 heard, RRR. No JVD, no murmur, No pedal edema. Gastrointestinal system: significant central obesity, Abdomen is nondistended, soft and nontender.  Normal bowel sounds heard. Central nervous system: Alert and oriented. No focal neurological deficits. Extremities: left BKA, right partial foot amputation Skin: No rashes, lesions or ulcers Psychiatry: Judgement and insight appear normal. Mood & affect appropriate.     Data Reviewed: I have personally reviewed following labs and imaging studies  CBC: Recent Labs  Lab 04/14/21 1623 04/14/21 1715 04/15/21 0419 04/16/21 0308  WBC 6.6  --  9.0 10.8*  NEUTROABS 3.4  --   --  8.0*  HGB 11.9* 12.6 11.3* 11.9*  HCT 38.1 37.0 35.7* 37.8  MCV 96.7  --  97.5 95.2  PLT 284  --  264 [id number]    Basic Metabolic Panel: Recent Labs  Lab 04/15/21 1159 04/15/21 1624 04/15/21 2055 04/16/21 0308 04/16/21 0744  NA 132* 133* 134* 138 136  K 4.8 4.1 4.1 3.8 4.5  CL 88* 89* 91* 95* 93*  CO2 30 29 25  33* 33*  GLUCOSE 716* 696* 450* 206* 205*  BUN 47* 47* 44* 44* 42*  CREATININE 1.30* 1.37* 1.11* 1.17* 1.02*  CALCIUM 8.8* 8.7* 8.8* 8.8* 8.8*    GFR: Estimated Creatinine Clearance: 112.2 mL/min (A) (by C-G formula based on SCr of 1.02 mg/dL (H)).  Liver Function Tests: Recent Labs  Lab 04/14/21 2145 04/15/21 1029 04/16/21 0308  AST 26 35 19  ALT 27 31 27   ALKPHOS 153* 168* 132*  BILITOT 0.4 0.1* 0.3  PROT 8.2* 7.9 8.1  ALBUMIN 3.0* 3.1* 2.9*    CBG: Recent Labs  Lab 04/16/21 0128 04/16/21 0233 04/16/21 0325 04/16/21 0438 04/16/21 0746  GLUCAP 218* 206* 195* 173* 198*     Recent Results (from the past 240 hour(s))  Resp Panel by RT-PCR (Flu A&B, Covid) Nasopharyngeal Swab     Status: Abnormal   Collection Time: 04/14/21  4:24 PM   Specimen: Nasopharyngeal Swab; Nasopharyngeal(NP) swabs in vial transport medium  Result Value Ref Range Status   SARS Coronavirus 2 by RT PCR POSITIVE (A) NEGATIVE Final    Comment: RESULT  CALLED TO, READ BACK BY AND VERIFIED WITH: B.BROOKS, RN AT 1916 ON 05.10.22 BY N.THOMPSON (NOTE) SARS-CoV-2 target nucleic acids are DETECTED.  The SARS-CoV-2 RNA is generally detectable in upper respiratory specimens during the acute phase of infection. Positive results are indicative of the presence of the identified virus, but do not rule out bacterial infection or co-infection with other pathogens not detected by the test. Clinical correlation with patient history and other diagnostic information is necessary to determine patient infection status. The expected result is Negative.  Fact Sheet for Patients: EntrepreneurPulse.com.au  Fact Sheet for Healthcare Providers: IncredibleEmployment.be  This test is not yet approved or cleared by the [place] FDA and  has been authorized for detection and/or diagnosis of SARS-CoV-2 by FDA under an Emergency Use Authorization (EUA).  This EUA will remain in effect (meaning thi s test can be used) for the duration of  the COVID-19 declaration under Section 564(b)(1) of the Act, 21 U.S.C. section 360bbb-3(b)(1), unless the authorization is terminated or revoked sooner.     Influenza A by PCR NEGATIVE NEGATIVE Final   Influenza B by PCR NEGATIVE NEGATIVE Final    Comment: (NOTE) The Xpert Xpress SARS-CoV-2/FLU/RSV plus assay is intended as an aid in the diagnosis of influenza from Nasopharyngeal swab specimens and should not be used as a sole basis for treatment. Nasal washings and aspirates are unacceptable for Xpert Xpress SARS-CoV-2/FLU/RSV testing.  Fact Sheet for Patients: EntrepreneurPulse.com.au  Fact Sheet for Healthcare  Providers: IncredibleEmployment.be  This test is not yet approved or cleared by the [place] and has been authorized for detection and/or diagnosis of SARS-CoV-2 by FDA under an Emergency Use Authorization (EUA). This EUA will  remain in effect (meaning this test can be used) for the duration of the COVID-19 declaration under Section 564(b)(1) of the Act, 21 U.S.C. section 360bbb-3(b)(1), unless the authorization is terminated or revoked.  Performed at [facility], [place] [address] [place], [place] [address]   Urine culture     Status: Abnormal   Collection Time: 04/14/21  4:25 PM   Specimen: Urine, Clean Catch  Result Value Ref Range Status   Specimen Description   Final    URINE, CLEAN CATCH Performed at [facility], [place] [address], [place], [place] [address]    Special Requests   Final    NONE Performed at [facility], [place] [address] [place], [place] [address]    Culture MULTIPLE SPECIES PRESENT, SUGGEST RECOLLECTION (A)  Final   Report Status 04/16/2021 FINAL  Final  Blood Culture (routine x 2)     Status: None (Preliminary result)   Collection Time: 04/14/21 10:05 PM   Specimen: BLOOD  Result Value Ref Range Status   Specimen Description   Final    BLOOD LEFT ANTECUBITAL Performed at [place] [address] [place], [place] [address]    Special Requests   Final    BOTTLES DRAWN AEROBIC AND ANAEROBIC Blood Culture adequate volume Performed at [place] [address], [place], [place] [address]    Culture   Final    NO GROWTH 1 DAY Performed at [facility], [place] [address], [place], [place] [address]    Report Status PENDING  Incomplete  Blood Culture (routine x 2)     Status: None (Preliminary result)   Collection Time: 04/14/21 10:12 PM   Specimen: BLOOD  Result Value Ref Range Status   Specimen Description   Final    BLOOD RIGHT ANTECUBITAL Performed at [place] [address], [place], [place] [address]    Special Requests   Final    BOTTLES DRAWN AEROBIC AND ANAEROBIC Blood Culture adequate volume Performed at [facility] [address] [place], [place] [address]    Culture   Final    NO GROWTH 1 DAY Performed at [facility], [place] [address], [place],  [address]    Report Status PENDING  Incomplete         Radiology Studies: DG Chest Port 1 View  Result Date: 04/14/2021 CLINICAL DATA:  Cough and body aches.  Shortness of breath. EXAM: PORTABLE CHEST 1 VIEW COMPARISON:  Most recent radiograph 11/17/2020. Most recent CT 11/07/2020 FINDINGS: Borderline cardiomegaly. Unchanged mediastinal contours. No acute airspace disease. Left basilar assessment is limited by soft tissue attenuation. No significant pleural effusion. No pneumothorax. Stable osseous structures IMPRESSION: Borderline cardiomegaly. No acute pulmonary process. Electronically Signed   By: Keith Rake M.D.   On: 04/14/2021 17:34        Scheduled Meds: . aspirin EC  81 mg Oral Daily  . atorvastatin  80 mg Oral Daily  . chlorhexidine  15 mL Mouth Rinse BID  . Chlorhexidine Gluconate Cloth  6 each Topical Daily  . [START ON 04/21/2021] Dulaglutide  1.5 mg Subcutaneous Weekly  . enoxaparin (LOVENOX) injection  80 mg Subcutaneous Q24H  . gabapentin  600 mg Oral TID  . insulin aspart  0-20 Units Subcutaneous TID WC  . insulin aspart  0-5 Units Subcutaneous QHS  . insulin aspart  45 Units Subcutaneous TID WC  . insulin glargine  90 Units Subcutaneous BID  . mouth rinse  15 mL Mouth Rinse q12n4p  . metoprolol succinate  25 mg Oral Daily  . mometasone-formoterol  2 puff Inhalation BID  . nitrofurantoin (macrocrystal-monohydrate)  100 mg Oral Q12H  . pantoprazole  40 mg Oral Daily  . [START ON 04/18/2021] predniSONE  50 mg Oral Daily  . rOPINIRole  1 mg Oral QHS  . torsemide  80 mg Oral Daily  . umeclidinium bromide  1 puff Inhalation Daily   Continuous Infusions: . lactated ringers 75 mL/hr at 04/16/21 0634  . methylPREDNISolone (SOLU-MEDROL) injection Stopped (04/16/21 0230)  . remdesivir 100 mg in NS 100 mL Stopped  (04/15/21 1624)     LOS: 2 days   Time spent: 39mins Greater than 50% of this time was spent in counseling, explanation of diagnosis, planning of further management, and coordination of care.   Voice Recognition Viviann Spare dictation system was used to create this note, attempts have been made to correct errors. Please contact the author with questions and/or clarifications.   Florencia Reasons, MD PhD FACP [facility]  Available via Epic secure chat 7am-7pm for nonurgent issues Please page for urgent issues To page the attending provider between 7A-7P or the covering provider during after hours 7P-7A, please log into the web site [email or web address] and access using universal [place] password for that web site. If you do not have the password, please call the hospital operator.    04/16/2021, 10:09 AM

## 2021-04-16 NOTE — Progress Notes (Signed)
Patient blood sugar 409, MD notified, orders to follow sliding scale received.   Will continue to monitor

## 2021-04-16 NOTE — TOC Initial Note (Signed)
Transition of Care [facility]) - Initial/Assessment Note    Patient Details  Name: [name] MRN: [id number] Date of Birth: [birth year]  Transition of [facility]) CM/SW Contact:    Leeroy Cha, RN Phone Number: 04/16/2021, 8:54 AM  Clinical Narrative:                 45 y.o. female with medical history significant for DMT2, morbid obesity, HTN, HLD, COPD, chronic pain who presents for evaluation of SOB and cough. She reports that symptoms began yesterday with a dry cough, loss of taste, nasal congestion and runny nose with body aches.  Shortness of breath that is exacerbated with exertion.  She is on 2 L of oxygen nasal cannula at home as needed.  Was found to have oxygen saturations in the low 80s on room air when she arrived emergency room.  She was placed on 2 L oxygen nasal cannula.  She reports she has been vaccinated for COVID and has had her booster shot.  She has no known exposures to anyone with COVID.  She states she is compliant with her medications but her blood sugars are generally above 200 she states.  Was seen in the ER a week ago for dysuria and urinary frequency and was diagnosed with urinary tract infection and treated with Cipro for a week.  She completed the antibiotics but continues to have dysuria and frequency. She denies tobacco alcohol or illicit drug use  ED Course: [name] is found to be COVID positive in the emergency room.  She is requiring oxygen.  Is positive for UTI.  WBC 6600, hemoglobin 11.9, hematocrit 30.1 platelets 284, sodium 133, potassium 4.1, creatinine 1.10 with a BUN of 38.  Alk phos is 153.  AST and ALT are normal.  CRP 10.7, lactic acid 2.1, procalcitonin 0.22, D-dimer 0.70 with a fibrinogen 733.  Patient was started on remdesivir in the emergency room PLAN: to go home with self care.  Expected Discharge Plan: Home/Self Care Barriers to Discharge: Continued Medical Work up   Patient Goals and CMS Choice Patient states their goals for this  hospitalization and ongoing recovery are:: to go home CMS Medicare.gov Compare Post Acute Care list provided to:: Patient    Expected Discharge Plan and Services Expected Discharge Plan: Home/Self Care   Discharge Planning Services: CM Consult   Living arrangements for the past 2 months: Apartment                                      Prior Living Arrangements/Services Living arrangements for the past 2 months: Apartment Lives with:: Spouse Patient language and need for interpreter reviewed:: Yes        Need for Family Participation in Patient Care: No (Comment) Care giver support system in place?: No (comment)   Criminal Activity/Legal Involvement Pertinent to Current Situation/Hospitalization: No - Comment as needed  Activities of Daily Living Home Assistive Devices/Equipment: Prosthesis,Walker (specify type),Wheelchair,CBG Meter ADL Screening (condition at time of admission) Patient's cognitive ability adequate to safely complete daily activities?: Yes Is the patient deaf or have difficulty hearing?: No Does the patient have difficulty seeing, even when wearing glasses/contacts?: Yes Does the patient have difficulty concentrating, remembering, or making decisions?: No Patient able to express need for assistance with ADLs?: Yes Does the patient have difficulty dressing or bathing?: Yes Independently performs ADLs?: No Communication: Independent Dressing (OT): Independent Grooming: Independent Feeding: Independent Bathing:  Independent Toileting: Independent In/Out Bed: Independent Walks in Home: Independent with device (comment) (Prosthetic left leg and walker) Does the patient have difficulty walking or climbing stairs?: Yes Weakness of Legs: Both Weakness of Arms/Hands: None  Permission Sought/Granted                  Emotional Assessment Appearance:: Appears stated age Attitude/Demeanor/Rapport: Engaged Affect (typically observed):  Calm Orientation: : Oriented to Place,Oriented to Self,Oriented to  Time,Oriented to Situation Alcohol / Substance Use: Not Applicable Psych Involvement: No (comment)  Admission diagnosis:  Acute cystitis without hematuria [N30.00] Acute respiratory failure due to COVID-19 ([name]) [U07.1, J96.00] Pneumonia due to COVID-19 virus [U07.1, J12.82] Patient Active Problem List   Diagnosis Date Noted  . Morbid obesity with BMI of 60.0-69.9, adult ([place]) 04/15/2021  . Hypoxia 04/15/2021  . UTI (urinary tract infection) 04/15/2021  . Chronic pain 04/15/2021  . Pneumonia due to COVID-19 virus 04/14/2021  . Urinary incontinence 10/23/2020  . Elevated troponin   . Acute respiratory failure with hypoxia and hypercarbia ([place]) 06/15/2020  . Chronic respiratory failure with hypoxia ([place]) 06/14/2020  . Blister of foot 04/21/2020  . Exposure to mold 04/21/2020  . Constipation 04/03/2020  . Left shoulder pain 06/23/2019  . Hyperglycemia due to diabetes mellitus ([place])   . 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[place]) 04/11/2019  . Allergic rhinitis 03/06/2018  . Class 3 severe obesity due to excess calories with serious comorbidity and body mass index (BMI) of 50.0 to 59.9 in adult [facility])   . Decreased pedal pulses 06/10/2017  . Unilateral primary osteoarthritis, right knee 10/22/2016  . Primary osteoarthritis of first carpometacarpal joint of left hand 07/30/2016  . Diabetic neuropathy ([place]) 07/14/2016  . Nausea 03/17/2016  . De Quervain's tenosynovitis, bilateral 11/01/2015  . Vitamin D deficiency 09/05/2015  . Recurrent candidiasis of vagina 09/05/2015  . Varicose veins of leg with complications 00/45/9977  . Restless leg syndrome 10/17/2014  . Chronic sinusitis 07/18/2014  . Encounter for chronic pain management 06/30/2013  . HLD (hyperlipidemia) 11/19/2012  . Angina pectoris ([place]) 06/27/2012  . Abscess of skin and subcutaneous tissue 11/04/2011  . Right carpal tunnel syndrome 09/01/2011  . Bilateral knee  pain 09/01/2011  . DM (diabetes mellitus) type II uncontrolled, periph vascular disorder ([place]) 05/22/2008  . Morbid obesity ([place]) 05/22/2008  . Obesity hypoventilation syndrome ([place]) 05/22/2008  . Mood disorder ([place]) 05/22/2008  . Obstructive sleep apnea 05/22/2008  . Hypertension 05/22/2008  . Asthma 05/22/2008  . GERD 05/22/2008   PCP:  [name], MD Pharmacy:   [facility] at [place], [place] - [address] [place] [address] Phone: [phone] Fax: [phone]     Social Determinants of Health (SDOH) Interventions    Readmission Risk Interventions No flowsheet data found.

## 2021-04-16 NOTE — Progress Notes (Signed)
PHARMACY - PHYSICIAN COMMUNICATION CRITICAL VALUE ALERT - BLOOD CULTURE IDENTIFICATION (BCID)  [name] is an 45 y.o. female who presented to [facility] on [facility]/14/2021 with a chief complaint of COVID infection  Assessment:  1/4 BCx bottles growing unidentified GPCs in clusters - no result on BCID (include suspected source if known)  Name of physician (or Provider) Contacted: Xu  Current antibiotics: none  Changes to prescribed antibiotics recommended: would treat as contaminant for now as long as clinically stable; f/u culture result. If patient declines would start vancomycin. Recommendations accepted by provider  Results for orders placed or performed during the hospital encounter of 04/14/21  Blood Culture ID Panel (Reflexed) (Collected: 04/14/2021 10:05 PM)  Result Value Ref Range   Enterococcus faecalis NOT DETECTED NOT DETECTED   Enterococcus Faecium NOT DETECTED NOT DETECTED   Listeria monocytogenes NOT DETECTED NOT DETECTED   Staphylococcus species NOT DETECTED NOT DETECTED   Staphylococcus aureus (BCID) NOT DETECTED NOT DETECTED   Staphylococcus epidermidis NOT DETECTED NOT DETECTED   Staphylococcus lugdunensis NOT DETECTED NOT DETECTED   Streptococcus species NOT DETECTED NOT DETECTED   Streptococcus agalactiae NOT DETECTED NOT DETECTED   Streptococcus pneumoniae NOT DETECTED NOT DETECTED   Streptococcus pyogenes NOT DETECTED NOT DETECTED   A.calcoaceticus-baumannii NOT DETECTED NOT DETECTED   Bacteroides fragilis NOT DETECTED NOT DETECTED   Enterobacterales NOT DETECTED NOT DETECTED   Enterobacter cloacae complex NOT DETECTED NOT DETECTED   Escherichia coli NOT DETECTED NOT DETECTED   Klebsiella aerogenes NOT DETECTED NOT DETECTED   Klebsiella oxytoca NOT DETECTED NOT DETECTED   Klebsiella pneumoniae NOT DETECTED NOT DETECTED   Proteus species NOT DETECTED NOT DETECTED   Salmonella species NOT DETECTED NOT DETECTED   Serratia marcescens NOT DETECTED NOT DETECTED    Haemophilus influenzae NOT DETECTED NOT DETECTED   Neisseria meningitidis NOT DETECTED NOT DETECTED   Pseudomonas aeruginosa NOT DETECTED NOT DETECTED   Stenotrophomonas maltophilia NOT DETECTED NOT DETECTED   Candida albicans NOT DETECTED NOT DETECTED   Candida auris NOT DETECTED NOT DETECTED   Candida glabrata NOT DETECTED NOT DETECTED   Candida krusei NOT DETECTED NOT DETECTED   Candida parapsilosis NOT DETECTED NOT DETECTED   Candida tropicalis NOT DETECTED NOT DETECTED   Cryptococcus neoformans/gattii NOT DETECTED NOT DETECTED    Math Brazie A 04/16/2021  5:27 PM

## 2021-04-16 NOTE — Progress Notes (Signed)
Chaplain responded to page from nurse Ethan--patient desires Advanced Directive. Chaplain met with patient and patient said she "knew all it about it and had the forms at home. If I knew you couldn't notarize I would not have called you up."  Chaplain explained the form anyway, highlighting finer points. Patient agreed that she was not near death and would be able to do the form when she's released from the hospital with the form she has already completed at home. Patient also said she wanted a DNR and chaplain said she would chart that but that was a discussion to have with other medical staff.  Patient is concerned about going on dialysis if needed.  She does not want it and worries she's going to be further burden for her 67 year daughter. She says her sister will be her health care agent until her daughter is of age to make that decision.  Chaplain listened to part of her story and offered her a blessing. Rev. Tamsen Snider Pager [phone]

## 2021-04-17 ENCOUNTER — Inpatient Hospital Stay (HOSPITAL_COMMUNITY): Payer: Medicaid Other

## 2021-04-17 LAB — GLUCOSE, CAPILLARY
Glucose-Capillary: 145 mg/dL — ABNORMAL HIGH (ref 70–99)
Glucose-Capillary: 139 mg/dL — ABNORMAL HIGH (ref 70–99)
Glucose-Capillary: 228 mg/dL — ABNORMAL HIGH (ref 70–99)
Glucose-Capillary: 329 mg/dL — ABNORMAL HIGH (ref 70–99)

## 2021-04-17 LAB — BASIC METABOLIC PANEL
Anion gap: 8 (ref 5–15)
BUN: 54 mg/dL — ABNORMAL HIGH (ref 6–20)
CO2: 37 mmol/L — ABNORMAL HIGH (ref 22–32)
Calcium: 8.6 mg/dL — ABNORMAL LOW (ref 8.9–10.3)
Chloride: 94 mmol/L — ABNORMAL LOW (ref 98–111)
Creatinine, Ser: 1.14 mg/dL — ABNORMAL HIGH (ref 0.44–1.00)
GFR, Estimated: >60 mL/min (ref >60)
Glucose, Bld: 217 mg/dL — ABNORMAL HIGH (ref 70–99)
Potassium: 4.1 mmol/L (ref 3.5–5.1)
Sodium: 139 mmol/L (ref 135–145)

## 2021-04-17 LAB — HEMOGLOBIN A1C
Hgb A1c MFr Bld: >15.5 % — ABNORMAL HIGH (ref 4.8–5.6)
Mean Plasma Glucose: >398 mg/dL

## 2021-04-17 MED ORDER — INSULIN GLARGINE 100 UNIT/ML ~~LOC~~ SOLN
90.0000 [IU] | Freq: Two times a day (BID) | SUBCUTANEOUS | Status: DC
  Administered 2021-04-17 – 2021-04-18 (×3): 90 [IU] via SUBCUTANEOUS
  Filled 2021-04-17 (×3): qty 0.9

## 2021-04-17 MED ORDER — INSULIN ASPART 100 UNIT/ML IJ SOLN
0.0000 [IU] | Freq: Every day | INTRAMUSCULAR | Status: DC
  Administered 2021-04-17: 4 [IU] via SUBCUTANEOUS

## 2021-04-17 MED ORDER — INSULIN ASPART 100 UNIT/ML IJ SOLN
50.0000 [IU] | Freq: Three times a day (TID) | INTRAMUSCULAR | Status: DC
  Administered 2021-04-17: 50 [IU] via SUBCUTANEOUS

## 2021-04-17 MED ORDER — INSULIN GLARGINE 100 UNIT/ML ~~LOC~~ SOLN: 90.0000 [IU] | Freq: Two times a day (BID) | SUBCUTANEOUS | Status: DC

## 2021-04-17 MED ORDER — INSULIN ASPART 100 UNIT/ML IJ SOLN
45.0000 [IU] | Freq: Three times a day (TID) | INTRAMUSCULAR | Status: DC
  Administered 2021-04-17 – 2021-04-18 (×3): 45 [IU] via SUBCUTANEOUS

## 2021-04-17 MED ORDER — SENNOSIDES-DOCUSATE SODIUM 8.6-50 MG PO TABS
1.0000 | ORAL_TABLET | Freq: Two times a day (BID) | ORAL | Status: DC
  Administered 2021-04-17 – 2021-04-18 (×3): 1 via ORAL
  Filled 2021-04-17 (×3): qty 1

## 2021-04-17 MED ORDER — INSULIN ASPART 100 UNIT/ML IJ SOLN
0.0000 [IU] | Freq: Three times a day (TID) | INTRAMUSCULAR | Status: DC
  Administered 2021-04-17: 3 [IU] via SUBCUTANEOUS
  Administered 2021-04-17: 7 [IU] via SUBCUTANEOUS
  Administered 2021-04-18 (×2): 3 [IU] via SUBCUTANEOUS

## 2021-04-17 MED ORDER — PREDNISONE 20 MG PO TABS
40.0000 mg | ORAL_TABLET | Freq: Every day | ORAL | Status: DC
  Administered 2021-04-18: 40 mg via ORAL
  Filled 2021-04-17: qty 2

## 2021-04-17 NOTE — Progress Notes (Signed)
   04/17/21 1000  What Happened  Was fall witnessed? Yes  Who witnessed fall? Cami Varner RN  Patients activity before fall to/from bed, chair, or stretcher  Point of contact head;arm/shoulder  Was patient injured? Unsure  Follow Up  MD notified Dr. Erlinda Hong  Time MD notified 102  Family notified Yes - comment (Patient notified)  Time family notified 0950  Adult Fall Risk Assessment  Risk Factor Category (scoring not indicated) High fall risk per protocol (document High fall risk)  Patient Fall Risk Level High fall risk  Adult Fall Risk Interventions  Required Bundle Interventions *See Row Information* High fall risk - low, moderate, and high requirements implemented  Additional Interventions Room near nurses station;Other (Comment) (Ambulate with 2 staff members, and baritic walker)  Screening for Fall Injury Risk (To be completed on HIGH fall risk patients) - Assessing Need for Floor Mats  Risk For Fall Injury- Criteria for Floor Mats None identified - No additional interventions needed  Vitals  Pulse Rate 68  ECG Heart Rate 68  Resp 12  Oxygen Therapy  SpO2 99 %

## 2021-04-17 NOTE — Progress Notes (Signed)
PROGRESS NOTE    [name]  [id number] DOB: [birth year] DOA: 04/14/2021 PCP: [name], MD    Chief Complaint  Patient presents with  . Shortness of Breath  . Generalized Body Aches    Brief Narrative:  [name] is a 45 y.o. female with medical history significant for DMT2, morbid obesity, HTN, HLD, COPD, chronic pain who presents for evaluation of SOB and cough, she is admitted for symptomatic COVID infection  She reports she has been vaccinated for COVID and has had her booster shot She developed hyperglycemia likely due to steroid use, she was put on insulin drip and transferred to stepdown  Slowly improving  Subjective:   She has an accidental fall at this morning, now she complains low back pain Noticed  O2 drop to the 80s on room air, o2 sats improves when taking deep breath or put on 2liter oxygen supplement  She declined lantus injection this morning ,reports it makes her nauseous  Assessment & Plan:   Principal Problem:   Pneumonia due to COVID-19 virus Active Problems:   DM (diabetes mellitus) type II uncontrolled, periph vascular disorder ([place])   Hypertension   HLD (hyperlipidemia)   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[place])   Morbid obesity with BMI of 60.0-69.9, adult ([place])   Hypoxia   UTI (urinary tract infection)   Chronic pain   COVID infection/acute on chronic hypoxic respiratory failure Presented with shortness of breath, cough, increased oxygen requirement -COVID test: PCR positive on admission -Chest imaging: Chest x-ray on admission no acute infiltrate  -Treatment: Currently on IV remdesivir plan for total of 5 days.  Initially started on IV steroids, currently on oral prednisone -Progression: Feels better  -Supportive care: Vitamin C, Zinc, PRN inhalers,  -Encouraged incentive spirometry, prone position, out of bed and early mobilization as much as possible -Continue airborne/contact isolation precautions  -inflammatory markers trending  down, wean steroids, wean oxygen   Insulin-dependent type 2 diabetes, uncontrolled with hyperglycemia -Patient is on Tresiba 90 units twice a day, NovoLog meal coverage 45 unit 3 times daily, also use Trulicity weekly She wears Dexcom for blood glucose monitoring -A1c greater than 15.5 -On presentation her blood glucose in the 500, after he started steroid treatment blood glucose went up to 700, she is transferred to stepdown started on insulin drip -Follow-up glucose better controlled today, off insulin drip, start Lantus 90 units twice a day, meal coverage NovoLog 45 units 3 times daily, plan to taper prednisone quickly if inflammatory markers continue come down  Hypertension On metoprolol and prn hydralazine  History of peripheral vascular disease Status post left BKA, status post right partial foot amputation  Body mass index is 63.46 kg/m.       Unresulted Labs (From admission, onward)          Start     Ordered   04/22/21 0500  Creatinine, serum  (enoxaparin (LOVENOX)    CrCl >/= 30 ml/min)  Weekly,   R     Comments: while on enoxaparin therapy    04/15/21 0125   04/16/21 0500  CBC with Differential/Platelet  Daily,   R      04/15/21 0125   04/16/21 0500  Comprehensive metabolic panel  Daily,   R      04/15/21 0125   04/16/21 0500  D-dimer, quantitative  Daily,   R      04/15/21 0125   04/16/21 0500  C-reactive protein  Daily,   R      04/15/21  0125   04/16/21 0500  Brain natriuretic peptide  Daily,   R      04/15/21 0830            DVT prophylaxis: lovenox   Code Status:DNR Family Communication: significant other at bedside, daughter over the phone on 5/12 Disposition:   Status is: Inpatient  Dispo: The patient is from: home               Anticipated d/c is to: home              Anticipated d/c date is: on 5/14                 Consultants:   none  Procedures:   none  Antimicrobials:   Anti-infectives (From admission, onward)   Start      Dose/Rate Route Frequency Ordered Stop   04/15/21 1600  remdesivir 100 mg in sodium chloride 0.9 % 100 mL IVPB       "Followed by" Linked Group Details   100 mg 200 mL/hr over 30 Minutes Intravenous Daily 04/14/21 2333 04/19/21 0959   04/15/21 1000  nitrofurantoin (macrocrystal-monohydrate) (MACROBID) capsule 100 mg        100 mg Oral Every 12 hours 04/15/21 0825     04/15/21 0000  remdesivir 100 mg in sodium chloride 0.9 % 100 mL IVPB       "Followed by" Linked Group Details   100 mg 200 mL/hr over 30 Minutes Intravenous Every 30 min 04/14/21 2333 04/15/21 0854          Objective: Vitals:   04/17/21 0945 04/17/21 1000 04/17/21 1300 04/17/21 1323  BP: (!) 122/94     Pulse:  68 74 73  Resp:  12 18 14   Temp:      TempSrc:      SpO2:  99% (!) 86% (!) 86%  Weight:      Height:        Intake/Output Summary (Last 24 hours) at 04/17/2021 1646 Last data filed at 04/17/2021 1324 Gross per 24 hour  Intake 2665.56 ml  Output 3750 ml  Net -1084.44 ml   Filed Weights   04/14/21 1550 04/15/21 1400  Weight: (!) 168 kg (!) 167.7 kg    Examination:  General exam: calm, NAD, obese  Respiratory system: Clear to auscultation. Respiratory effort normal. Cardiovascular system: S1 & S2 heard, RRR. No JVD, no murmur, No pedal edema. Gastrointestinal system: significant central obesity, Abdomen is nondistended, soft and nontender.  Normal bowel sounds heard. Central nervous system: Alert and oriented. No focal neurological deficits. Extremities: left BKA, right partial foot amputation Skin: No rashes, lesions or ulcers Psychiatry: Judgement and insight appear normal. Mood & affect appropriate.     Data Reviewed: I have personally reviewed following labs and imaging studies  CBC: Recent Labs  Lab 04/14/21 1623 04/14/21 1715 04/15/21 0419 04/16/21 0308  WBC 6.6  --  9.0 10.8*  NEUTROABS 3.4  --   --  8.0*  HGB 11.9* 12.6 11.3* 11.9*  HCT 38.1 37.0 35.7* 37.8  MCV 96.7  --   97.5 95.2  PLT 284  --  264 267    Basic Metabolic Panel: Recent Labs  Lab 04/16/21 1148 04/16/21 1520 04/16/21 1936 04/16/21 2303 04/17/21 1447  NA 135 136 134* 137 139  K 4.5 4.0 3.8 3.8 4.1  CL 92* 90* 88* 91* 94*  CO2 32 33* 33* 36* 37*  GLUCOSE 380* 451* 371* 373* 217*  BUN  44* 47* 46* 48* 54*  CREATININE 1.10* 1.11* 1.12* 1.15* 1.14*  CALCIUM 8.8* 8.8* 8.6* 8.5* 8.6*    GFR: Estimated Creatinine Clearance: 100.4 mL/min (A) (by C-G formula based on SCr of 1.14 mg/dL (H)).  Liver Function Tests: Recent Labs  Lab 04/14/21 2145 04/15/21 1029 04/16/21 0308  AST 26 35 19  ALT 27 31 27   ALKPHOS 153* 168* 132*  BILITOT 0.4 0.1* 0.3  PROT 8.2* 7.9 8.1  ALBUMIN 3.0* 3.1* 2.9*    CBG: Recent Labs  Lab 04/16/21 1708 04/16/21 2132 04/17/21 0750 04/17/21 1111 04/17/21 1532  GLUCAP 409* 340* 145* 139* 228*     Recent Results (from the past 240 hour(s))  Resp Panel by RT-PCR (Flu A&B, Covid) Nasopharyngeal Swab     Status: Abnormal   Collection Time: 04/14/21  4:24 PM   Specimen: Nasopharyngeal Swab; Nasopharyngeal(NP) swabs in vial transport medium  Result Value Ref Range Status   SARS Coronavirus 2 by RT PCR POSITIVE (A) NEGATIVE Final    Comment: RESULT CALLED TO, READ BACK BY AND VERIFIED WITH: B.BROOKS, RN AT 1916 ON 05.10.22 BY N.THOMPSON (NOTE) SARS-CoV-2 target nucleic acids are DETECTED.  The SARS-CoV-2 RNA is generally detectable in upper respiratory specimens during the acute phase of infection. Positive results are indicative of the presence of the identified virus, but do not rule out bacterial infection or co-infection with other pathogens not detected by the test. Clinical correlation with patient history and other diagnostic information is necessary to determine patient infection status. The expected result is Negative.  Fact Sheet for Patients: EntrepreneurPulse.com.au  Fact Sheet for Healthcare  Providers: IncredibleEmployment.be  This test is not yet approved or cleared by the [place] FDA and  has been authorized for detection and/or diagnosis of SARS-CoV-2 by FDA under an Emergency Use Authorization (EUA).  This EUA will remain in effect (meaning thi s test can be used) for the duration of  the COVID-19 declaration under Section 564(b)(1) of the Act, 21 U.S.C. section 360bbb-3(b)(1), unless the authorization is terminated or revoked sooner.     Influenza A by PCR NEGATIVE NEGATIVE Final   Influenza B by PCR NEGATIVE NEGATIVE Final    Comment: (NOTE) The Xpert Xpress SARS-CoV-2/FLU/RSV plus assay is intended as an aid in the diagnosis of influenza from Nasopharyngeal swab specimens and should not be used as a sole basis for treatment. Nasal washings and aspirates are unacceptable for Xpert Xpress SARS-CoV-2/FLU/RSV testing.  Fact Sheet for Patients: EntrepreneurPulse.com.au  Fact Sheet for Healthcare Providers: IncredibleEmployment.be  This test is not yet approved or cleared by the [place] FDA and has been authorized for detection and/or diagnosis of SARS-CoV-2 by FDA under an Emergency Use Authorization (EUA). This EUA will remain in effect (meaning this test can be used) for the duration of the COVID-19 declaration under Section 564(b)(1) of the Act, 21 U.S.C. section 360bbb-3(b)(1), unless the authorization is terminated or revoked.  Performed at [facility], [place] [address] [place], [place] [address]   Urine culture     Status: Abnormal   Collection Time: 04/14/21  4:25 PM   Specimen: Urine, Clean Catch  Result Value Ref Range Status   Specimen Description   Final    URINE, CLEAN CATCH Performed at [facility], [place] [address], [place], [place] [address]    Special Requests   Final    NONE Performed at [facility], [place] [address] [place],  [address]  Culture MULTIPLE SPECIES PRESENT, SUGGEST RECOLLECTION (A)  Final   Report Status 04/16/2021 FINAL  Final  Blood Culture (routine x 2)     Status: Abnormal (Preliminary result)   Collection Time: 04/14/21 10:05 PM   Specimen: BLOOD  Result Value Ref Range Status   Specimen Description   Final    BLOOD LEFT ANTECUBITAL Performed at [place] [address], [place], [place] [address]    Special Requests   Final    BOTTLES DRAWN AEROBIC AND ANAEROBIC Blood Culture adequate volume Performed at [place] [address] [place], [place] [address]    Culture  Setup Time   Final    GRAM POSITIVE COCCI IN CLUSTERS AEROBIC BOTTLE ONLY Organism ID to follow CRITICAL RESULT CALLED TO, READ BACK BY AND VERIFIED WITH: D [facility] PHARMD [id number] 04/16/21 A BROWNING    Culture (A)  Final    MICROCOCCUS SPECIES Standardized susceptibility testing for this organism is not available. CULTURE REINCUBATED FOR BETTER GROWTH Performed at [facility], [place] [address] [place], [place] [address]    Report Status PENDING  Incomplete  Blood Culture ID Panel (Reflexed)     Status: None   Collection Time: 04/14/21 10:05 PM  Result Value Ref Range Status   Enterococcus faecalis NOT DETECTED NOT DETECTED Final   Enterococcus Faecium NOT DETECTED NOT DETECTED Final   Listeria monocytogenes NOT DETECTED NOT DETECTED Final   Staphylococcus species NOT DETECTED NOT DETECTED Final   Staphylococcus aureus (BCID) NOT DETECTED NOT DETECTED Final   Staphylococcus epidermidis NOT DETECTED NOT DETECTED Final   Staphylococcus lugdunensis NOT DETECTED NOT DETECTED Final   Streptococcus species NOT DETECTED NOT DETECTED Final   Streptococcus agalactiae NOT DETECTED NOT DETECTED Final   Streptococcus pneumoniae NOT DETECTED NOT DETECTED Final   Streptococcus pyogenes NOT DETECTED NOT DETECTED Final   A.calcoaceticus-baumannii NOT DETECTED NOT  DETECTED Final   Bacteroides fragilis NOT DETECTED NOT DETECTED Final   Enterobacterales NOT DETECTED NOT DETECTED Final   Enterobacter cloacae complex NOT DETECTED NOT DETECTED Final   Escherichia coli NOT DETECTED NOT DETECTED Final   Klebsiella aerogenes NOT DETECTED NOT DETECTED Final   Klebsiella oxytoca NOT DETECTED NOT DETECTED Final   Klebsiella pneumoniae NOT DETECTED NOT DETECTED Final   Proteus species NOT DETECTED NOT DETECTED Final   Salmonella species NOT DETECTED NOT DETECTED Final   Serratia marcescens NOT DETECTED NOT DETECTED Final   Haemophilus influenzae NOT DETECTED NOT DETECTED Final   Neisseria meningitidis NOT DETECTED NOT DETECTED Final   Pseudomonas aeruginosa NOT DETECTED NOT DETECTED Final   Stenotrophomonas maltophilia NOT DETECTED NOT DETECTED Final   Candida albicans NOT DETECTED NOT DETECTED Final   Candida auris NOT DETECTED NOT DETECTED Final   Candida glabrata NOT DETECTED NOT DETECTED Final   Candida krusei NOT DETECTED NOT DETECTED Final   Candida parapsilosis NOT DETECTED NOT DETECTED Final   Candida tropicalis NOT DETECTED NOT DETECTED Final   Cryptococcus neoformans/gattii NOT DETECTED NOT DETECTED Final    Comment: Performed at [facility], [address] [place], [place] [address]  Blood Culture (routine x 2)     Status: None (Preliminary result)   Collection Time: 04/14/21 10:12 PM   Specimen: BLOOD  Result Value Ref Range Status   Specimen Description   Final    BLOOD RIGHT ANTECUBITAL Performed at [place] [address] [place], [place] [address]    Special Requests  Final    BOTTLES DRAWN AEROBIC AND ANAEROBIC Blood Culture adequate volume Performed at [place] [address] [place], [place] [address]    Culture   Final    NO GROWTH 2 DAYS Performed at [place] [address], [place], [place] [address]    Report Status PENDING  Incomplete  MRSA PCR Screening      Status: None   Collection Time: 04/15/21  1:45 PM   Specimen: Nasal Mucosa; Nasopharyngeal  Result Value Ref Range Status   MRSA by PCR NEGATIVE NEGATIVE Final    Comment:        The GeneXpert MRSA Assay (FDA approved for NASAL specimens only), is one component of a comprehensive MRSA colonization surveillance program. It is not intended to diagnose MRSA infection nor to guide or monitor treatment for MRSA infections. Performed at [facility], [place] [address] [place], [place] [address]          Radiology Studies: No results found.      Scheduled Meds: . vitamin C  250 mg Oral Daily  . aspirin EC  81 mg Oral Daily  . atorvastatin  80 mg Oral Daily  . chlorhexidine  15 mL Mouth Rinse BID  . Chlorhexidine Gluconate Cloth  6 each Topical Daily  . [START ON 04/21/2021] Dulaglutide  1.5 mg Subcutaneous Weekly  . enoxaparin (LOVENOX) injection  80 mg Subcutaneous Q24H  . gabapentin  600 mg Oral TID  . insulin aspart  0-20 Units Subcutaneous TID WC  . insulin aspart  0-5 Units Subcutaneous QHS  . insulin aspart  45 Units Subcutaneous TID WC  . insulin glargine  90 Units Subcutaneous BID  . mouth rinse  15 mL Mouth Rinse q12n4p  . metoprolol succinate  25 mg Oral Daily  . mometasone-formoterol  2 puff Inhalation BID  . nitrofurantoin (macrocrystal-monohydrate)  100 mg Oral Q12H  . pantoprazole  40 mg Oral Daily  . predniSONE  50 mg Oral Q breakfast  . rOPINIRole  1 mg Oral QHS  . senna-docusate  1 tablet Oral BID  . torsemide  80 mg Oral Daily  . umeclidinium bromide  1 puff Inhalation Daily  . zinc sulfate  220 mg Oral Daily   Continuous Infusions: . remdesivir 100 mg in NS 100 mL Stopped (04/17/21 1121)     LOS: 3 days   Time spent: 49mins Greater than 50% of this time was spent in counseling, explanation of diagnosis, planning of further management, and coordination of care.   Voice Recognition Viviann Spare dictation system was used to create  this note, attempts have been made to correct errors. Please contact the author with questions and/or clarifications.   Florencia Reasons, MD PhD FACP [facility]  Available via Epic secure chat 7am-7pm for nonurgent issues Please page for urgent issues To page the attending provider between 7A-7P or the covering provider during after hours 7P-7A, please log into the web site [email or web address] and access using universal [place] password for that web site. If you do not have the password, please call the hospital operator.    04/17/2021, 4:46 PM

## 2021-04-17 NOTE — Progress Notes (Signed)
Nurse place patient on machine since she uses it at home no problems will call Rt if problems arise

## 2021-04-18 LAB — CBC WITH DIFFERENTIAL/PLATELET
Abs Immature Granulocytes: 0.07 10*3/uL (ref 0.00–0.07)
Basophils Absolute: 0 10*3/uL (ref 0.0–0.1)
Basophils Relative: 0 %
Eosinophils Absolute: 0 10*3/uL (ref 0.0–0.5)
Eosinophils Relative: 0 %
HCT: 40.2 % (ref 36.0–46.0)
Hemoglobin: 12.4 g/dL (ref 12.0–15.0)
Immature Granulocytes: 1 %
Lymphocytes Relative: 41 %
Lymphs Abs: 4 10*3/uL (ref 0.7–4.0)
MCH: 29.9 pg (ref 26.0–34.0)
MCHC: 30.8 g/dL (ref 30.0–36.0)
MCV: 96.9 fL (ref 80.0–100.0)
Monocytes Absolute: 1.1 10*3/uL — ABNORMAL HIGH (ref 0.1–1.0)
Monocytes Relative: 11 %
Neutro Abs: 4.5 10*3/uL (ref 1.7–7.7)
Neutrophils Relative %: 47 %
Platelets: 313 10*3/uL (ref 150–400)
RBC: 4.15 MIL/uL (ref 3.87–5.11)
RDW: 14.4 % (ref 11.5–15.5)
WBC: 9.7 10*3/uL (ref 4.0–10.5)
nRBC: 0 % (ref 0.0–0.2)

## 2021-04-18 LAB — COMPREHENSIVE METABOLIC PANEL
ALT: 20 U/L (ref 0–44)
AST: 16 U/L (ref 15–41)
Albumin: 3.1 g/dL — ABNORMAL LOW (ref 3.5–5.0)
Alkaline Phosphatase: 125 U/L (ref 38–126)
Anion gap: 9 (ref 5–15)
BUN: 54 mg/dL — ABNORMAL HIGH (ref 6–20)
CO2: 38 mmol/L — ABNORMAL HIGH (ref 22–32)
Calcium: 8.4 mg/dL — ABNORMAL LOW (ref 8.9–10.3)
Chloride: 93 mmol/L — ABNORMAL LOW (ref 98–111)
Creatinine, Ser: 1.11 mg/dL — ABNORMAL HIGH (ref 0.44–1.00)
GFR, Estimated: >60 mL/min (ref >60)
Glucose, Bld: 132 mg/dL — ABNORMAL HIGH (ref 70–99)
Potassium: 3.3 mmol/L — ABNORMAL LOW (ref 3.5–5.1)
Sodium: 140 mmol/L (ref 135–145)
Total Bilirubin: 0.3 mg/dL (ref 0.3–1.2)
Total Protein: 7.7 g/dL (ref 6.5–8.1)

## 2021-04-18 LAB — CULTURE, BLOOD (ROUTINE X 2): Special Requests: ADEQUATE

## 2021-04-18 LAB — D-DIMER, QUANTITATIVE: D-Dimer, Quant: 0.54 ug/mL-FEU — ABNORMAL HIGH (ref 0.00–0.50)

## 2021-04-18 LAB — GLUCOSE, CAPILLARY
Glucose-Capillary: 142 mg/dL — ABNORMAL HIGH (ref 70–99)
Glucose-Capillary: 123 mg/dL — ABNORMAL HIGH (ref 70–99)

## 2021-04-18 LAB — BRAIN NATRIURETIC PEPTIDE: B Natriuretic Peptide: 40.1 pg/mL (ref 0.0–100.0)

## 2021-04-18 LAB — C-REACTIVE PROTEIN: CRP: 1.8 mg/dL — ABNORMAL HIGH (ref <1.0)

## 2021-04-18 MED ORDER — POTASSIUM CHLORIDE CRYS ER 20 MEQ PO TBCR
40.0000 meq | EXTENDED_RELEASE_TABLET | Freq: Once | ORAL | Status: DC
  Filled 2021-04-18: qty 2

## 2021-04-18 MED ORDER — PREDNISONE 10 MG PO TABS: ORAL_TABLET | ORAL | 0 refills | Status: DC

## 2021-04-18 MED ORDER — ZINC SULFATE 220 (50 ZN) MG PO CAPS: 220.0000 mg | ORAL_CAPSULE | Freq: Every day | ORAL | 0 refills | Status: AC

## 2021-04-18 MED ORDER — ASCORBIC ACID 250 MG PO TABS: 250.0000 mg | ORAL_TABLET | Freq: Every day | ORAL | Status: DC

## 2021-04-18 NOTE — Progress Notes (Signed)
Patient escorted to main entrance by RN and NT, discharged in the care of her daughter and mother.

## 2021-04-18 NOTE — Plan of Care (Signed)

## 2021-04-18 NOTE — Progress Notes (Signed)
Reviewed discharge paperwork in detail with patient. Went over new prescriptions and instructions, follow up appointments, and answered questions.

## 2021-04-18 NOTE — Progress Notes (Signed)
Discharge Summary  [name] [id number] DOB: Apr 22, 1976  PCP: [name], MD  Admit date: 04/14/2021 Discharge date: 04/18/2021  Time spent:  46mns  Recommendations for Outpatient Follow-up:  1. F/u with PCP within a week  for hospital discharge follow up, repeat cbc/bmp at follow up 2. Home health ordered     Discharge Diagnoses:  Active Hospital Problems   Diagnosis Date Noted  . Pneumonia due to COVID-19 virus 04/14/2021  . Morbid obesity with BMI of 60.0-69.9, adult ([facility] 04/15/2021  . Hypoxia 04/15/2021  . UTI (urinary tract infection) 04/15/2021  . Chronic pain 04/15/2021  . 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[facility] 04/11/2019  . HLD (hyperlipidemia) 11/19/2012  . DM (diabetes mellitus) type II uncontrolled, periph vascular disorder ([facility] 05/22/2008  . Hypertension 05/22/2008    Resolved Hospital Problems  No resolved problems to display.    Discharge Condition: stable  Diet recommendation: heart healthy/carb modified  Filed Weights   04/14/21 1550 04/15/21 1400  Weight: (!) 168 kg (!) 167.7 kg    History of present illness: ( per admitting MD Dr CTonie Griffith Chief Complaint: SOB, dysuria  HPI: [name] a 45y.o. female with medical history significant for DMT2, morbid obesity, HTN, HLD, COPD, chronic pain who presents for evaluation of SOB and cough. She reports that symptoms began yesterday with a dry cough, loss of taste, nasal congestion and runny nose with body aches.  Shortness of breath that is exacerbated with exertion.  She is on 2 L of oxygen nasal cannula at home as needed.  Was found to have oxygen saturations in the low 80s on room air when she arrived emergency room.  She was placed on 2 L oxygen nasal cannula.  She reports she has been vaccinated for COVID and has had her booster shot.  She has no known exposures to anyone with COVID.  She states she is compliant with her medications but her blood sugars are generally above 200 she states.  Was  seen in the ER a week ago for dysuria and urinary frequency and was diagnosed with urinary tract infection and treated with Cipro for a week.  She completed the antibiotics but continues to have dysuria and frequency. She denies tobacco alcohol or illicit drug use  ED Course: BVolanda Napoleonis found to be COVID positive in the emergency room.  She is requiring oxygen.  Is positive for UTI.  WBC 6600, hemoglobin 11.9, hematocrit 30.1 platelets 284, sodium 133, potassium 4.1, creatinine 1.10 with a BUN of 38.  Alk phos is 153.  AST and ALT are normal.  CRP 10.7, lactic acid 2.1, procalcitonin 0.22, D-dimer 0.70 with a fibrinogen 733.  Patient was started on remdesivir in the emergency room   Hospital Course:  Principal Problem:   Pneumonia due to COVID-19 virus Active Problems:   DM (diabetes mellitus) type II uncontrolled, periph vascular disorder ([name])   Hypertension   HLD (hyperlipidemia)   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[facility]   Morbid obesity with BMI of 60.0-69.9, adult ([name])   Hypoxia   UTI (urinary tract infection)   Chronic pain  COVID infection/acute on chronic hypoxic respiratory failure Presented withshortness of breath, cough, increased oxygen requirement -Was found to have oxygen saturations in the low 80s on room air when she arrived emergency room.  -COVID test:PCR positive on admission -Chest imaging:Chest x-ray on admission no acute infiltrate -received  IV remdesivir  for total of 5 days. Initially started on IV steroids, then on oral prednisone --Supportive care: Vitamin  C, Zinc, PRN inhalers,  --inflammatory markers trending down, crp on presentation was 10.7, crp on discharge is 1.8, clinically improved oxygen weaned back to baseline, discharged home with quick steroid taper F/u with pcp  Insulin-dependent type 2 diabetes, uncontrolled with hyperglycemia -Patient is on Tresiba 90 units twice a day, NovoLog meal coverage 45 unit 3 times daily, also use Trulicity weekly She  wears Dexcom for blood glucose monitoring -A1c greater than 15.5 -On presentation her blood glucose in the 500, after he started steroid treatment blood glucose went up to 700, she is transferred to stepdown started on insulin drip -blood glucose much improved at discharge, restarted back on home regimen   Hypertension On metoprolol   History of peripheral vascular disease Status post left BKA, status post right partial foot amputation  Body mass index is 63.46 kg/m. OSA on cpap at home  DVT prophylaxis: lovenox   Code Status:DNR Family Communication: significant other at bedside, daughter over the phone on 5/12 Disposition: home  Procedures:  none  Consultations:  none  Discharge Exam: BP (!) 133/92   Pulse 72   Temp 98.2 F (36.8 C) (Oral)   Resp 20   Ht '5\' 4"'  (1.626 m)   Wt (!) 167.7 kg   SpO2 100%   BMI 63.46 kg/m   General: obese, NAD, AAOx3 Cardiovascular: RRR Respiratory: normal respiratory effort  Discharge Instructions You were cared for by a hospitalist during your hospital stay. If you have any questions about your discharge medications or the care you received while you were in the hospital after you are discharged, you can call the unit and asked to speak with the hospitalist on call if the hospitalist that took care of you is not available. Once you are discharged, your primary care physician will handle any further medical issues. Please note that NO REFILLS for any discharge medications will be authorized once you are discharged, as it is imperative that you return to your primary care physician (or establish a relationship with a primary care physician if you do not have one) for your aftercare needs so that they can reassess your need for medications and monitor your lab values.  Discharge Instructions    Diet Carb Modified   Complete by: As directed    Increase activity slowly   Complete by: As directed      Allergies as of 04/18/2021       Reactions   Cefepime Other (See Comments)   AKI, see records from [facility] hospitalization in January 2020.   Other reaction(s): Other (See Comments) Note pt has tolerated Rocephin and Keflex   Kiwi Extract Shortness Of Breath, Swelling, Anaphylaxis   Morphine And Related Nausea And Vomiting   Nalbuphine    Other reaction(s): Hallucinations, Other (See Comments) "FEELS LIKE SOMETHING CRAWLING ON ME" Reaction: Nervousness "FEELS LIKE SOMETHING CRAWLING ON ME" Reaction: Nervousness Reaction: Nervousness "FEELS LIKE SOMETHING CRAWLING ON ME"   Trental [pentoxifylline] Nausea And Vomiting   Morphine Nausea And Vomiting   Nubain [nalbuphine Hcl] Other (See Comments)   "FEELS LIKE SOMETHING CRAWLING ON ME"      Medication List    STOP taking these medications   ciprofloxacin 500 MG tablet Commonly known as: CIPRO   ipratropium-albuterol 0.5-2.5 (3) MG/3ML Soln Commonly known as: DUONEB   loratadine 10 MG tablet Commonly known as: CLARITIN   ondansetron 4 MG disintegrating tablet Commonly known as: Zofran ODT     TAKE these medications   Accu-Chek Guide test strip Generic  drug: glucose blood USE T0 CHECK BLOOD SUGAR 3 TIMES DAILY   Accu-Chek Softclix Lancets lancets Use to check blood glucose four times daily   Accu-Chek FastClix Lancets Misc USE DAILY AS INSTRUCTED   ascorbic acid 250 MG tablet Commonly known as: VITAMIN C Take 1 tablet (250 mg total) by mouth daily. Start taking on: Apr 19, 2021   aspirin EC 81 MG tablet Take 1 tablet (81 mg total) by mouth daily. Swallow whole.   atorvastatin 80 MG tablet Commonly known as: LIPITOR Take 1 tablet (80 mg total) by mouth daily.   budesonide-formoterol 160-4.5 MCG/ACT inhaler Commonly known as: Symbicort Inhale 2 puffs into the lungs 2 (two) times daily.   Cholecalciferol 25 MCG (1000 UT) tablet Take 1 tablet (1,000 Units total) by mouth daily.   Compression Bandages Misc Wear daily for swelling    Dexcom G6 Sensor Misc Inject 1 applicator into the skin as directed. Change sensor every 10 days.   Dexcom G6 Transmitter Misc Inject 1 Device into the skin as directed. Reuse 8 times with sensor changes.   gabapentin 600 MG tablet Commonly known as: NEURONTIN TAKE 2 TABLETS BY MOUTH 3 TIMES DAILY   HYDROcodone-acetaminophen 10-325 MG tablet Commonly known as: NORCO Take 1 tablet by mouth every 8 (eight) hours as needed for moderate pain.   HYDROcodone-acetaminophen 10-325 MG tablet Commonly known as: NORCO Take 1 tablet by mouth every 8 (eight) hours as needed for moderate pain.   HYDROcodone-acetaminophen 10-325 MG tablet Commonly known as: NORCO Take 1 tablet by mouth every 8 (eight) hours as needed for moderate pain.   insulin aspart 100 UNIT/ML FlexPen Commonly known as: NOVOLOG Inject 40 Units into the skin 3 (three) times daily with meals. What changed: how much to take   Iron 325 (65 Fe) MG Tabs Take 1 tablet (325 mg total) by mouth every other day.   metoprolol succinate 25 MG 24 hr tablet Commonly known as: TOPROL-XL Take 25 mg by mouth daily.   naproxen 500 MG tablet Commonly known as: NAPROSYN Take 1 tablet (500 mg total) by mouth 2 (two) times daily as needed for moderate pain.   omeprazole 40 MG capsule Commonly known as: PRILOSEC Take 1 capsule (40 mg total) by mouth daily.   phenazopyridine 200 MG tablet Commonly known as: PYRIDIUM Take 1 tablet (200 mg total) by mouth 3 (three) times daily as needed for pain.   potassium chloride SA 20 MEQ tablet Commonly known as: Klor-Con M20 Take 1 tablet (20 mEq total) by mouth daily.   predniSONE 10 MG tablet Commonly known as: DELTASONE Take 103m (3tabs) with breakfast on 5/15, then take 215m(two tabs)with breakfast on 5/14, then take 1043m(one tab)with breakfast on 5/15, then take 5mg46malf tab) with breakfast on 5/16, then stop.   rOPINIRole 1 MG tablet Commonly known as: REQUIP Take 1 tablet (1 mg  total) by mouth at bedtime.   Spiriva Respimat 1.25 MCG/ACT Aers Generic drug: Tiotropium Bromide Monohydrate Inhale 2 puffs into the lungs daily.   Sure Comfort Pen Needles 32G X 4 MM Misc Generic drug: Insulin Pen Needle Use to inject insulin as indicated   torsemide 20 MG tablet Commonly known as: DEMADEX TAKE 4 TABLETS (80 MG) BY MOUTH ONCE A DAY   Transfer Bench Misc Use daily   Tresiba FlexTouch 200 UNIT/ML FlexTouch Pen Generic drug: insulin degludec Inject 110 Units into the skin 2 (two) times daily. What changed: how much to take  Trulicity 1.5 EX/9.3ZJ Sopn Generic drug: Dulaglutide Inject 1.5 mg into the skin once a week.   zinc sulfate 220 (50 Zn) MG capsule Take 1 capsule (220 mg total) by mouth daily for 14 days. Start taking on: Apr 19, 2021      Allergies  Allergen Reactions  . Cefepime Other (See Comments)    AKI, see records from [place] [place] hospitalization in January 2020.   Other reaction(s): Other (See Comments)  Note pt has tolerated Rocephin and Keflex  . Kiwi Extract Shortness Of Breath, Swelling and Anaphylaxis  . Morphine And Related Nausea And Vomiting  . Nalbuphine     Other reaction(s): Hallucinations, Other (See Comments) "FEELS LIKE SOMETHING CRAWLING ON ME" Reaction: Nervousness "FEELS LIKE SOMETHING CRAWLING ON ME" Reaction: Nervousness Reaction: Nervousness "FEELS LIKE SOMETHING CRAWLING ON ME"   . Trental [Pentoxifylline] Nausea And Vomiting  . Morphine Nausea And Vomiting  . Nubain [Nalbuphine Hcl] Other (See Comments)    "FEELS LIKE SOMETHING CRAWLING ON ME"    Follow-up Information    [name], MD Follow up in 2 week(s).   Specialty: Family Medicine Why: hospital discharge follow up Contact information: [place] [address] [phone]                The results of significant diagnostics from this hospitalization (including imaging, microbiology, ancillary and laboratory)  are listed below for reference.    Significant Diagnostic Studies: DG Lumbar Spine 2-3 Views  Result Date: 04/17/2021 CLINICAL DATA:  Fall today with low back pain. EXAM: LUMBAR SPINE - 2-3 VIEW COMPARISON:  Reformats from abdominopelvic CT 04/03/2021 FINDINGS: The alignment is maintained. Vertebral body heights are normal. There is no listhesis. The posterior elements are intact. Disc spaces are preserved. No fracture. Multilevel facet hypertrophy. Sacroiliac joints are symmetric with mild degenerative change. IMPRESSION: No fracture or subluxation of the lumbar spine. Electronically Signed   By: Keith Rake M.D.   On: 04/17/2021 19:40   CT HEAD WO CONTRAST  Result Date: 04/17/2021 CLINICAL DATA:  Provided history: Fall, hit her head against chair. Accidental fall. EXAM: CT HEAD WITHOUT CONTRAST TECHNIQUE: Contiguous axial images were obtained from the base of the skull through the vertex without intravenous contrast. COMPARISON:  Brain MRI 07/13/2014.  CT angiogram head 07/13/2014. FINDINGS: Brain: Cerebral volume is normal. There is no acute intracranial hemorrhage. No demarcated cortical infarct. No extra-axial fluid collection. No evidence of intracranial mass. No midline shift. Vascular: No hyperdense vessel.  Atherosclerotic calcifications Skull: Normal. Negative for fracture or focal lesion. Sinuses/Orbits: Visualized orbits show no acute finding. Subcentimeter osteoma within a posterior right ethmoid air cell. Moderate bilateral ethmoid sinus mucosal thickening. Mild mucosal thickening and small volume frothy secretions within the left sphenoid sinus. Trace right sphenoid sinus mucosal thickening. Mild bilateral maxillary sinus mucosal thickening. IMPRESSION: Unremarkable non-contrast CT appearance of the brain. No evidence of acute intracranial abnormality. Paranasal sinus disease, as described. Electronically Signed   By: Kellie Simmering DO   On: 04/17/2021 17:12   DG Chest Port 1  View  Result Date: 04/14/2021 CLINICAL DATA:  Cough and body aches.  Shortness of breath. EXAM: PORTABLE CHEST 1 VIEW COMPARISON:  Most recent radiograph 11/17/2020. Most recent CT 11/07/2020 FINDINGS: Borderline cardiomegaly. Unchanged mediastinal contours. No acute airspace disease. Left basilar assessment is limited by soft tissue attenuation. No significant pleural effusion. No pneumothorax. Stable osseous structures IMPRESSION: Borderline cardiomegaly. No acute pulmonary process. Electronically Signed   By: Aurther Loft.D.  On: 04/14/2021 17:34   DG Knee Complete 4 Views Left  Result Date: 03/30/2021 CLINICAL DATA:  Left knee pain after fall EXAM: LEFT KNEE - COMPLETE 4+ VIEW COMPARISON:  None. FINDINGS: Surgical anchor in the lateral left tibia. Status post below-knee amputation. No acute fracture. IMPRESSION: No acute fracture or dislocation of the left knee. Status post below-knee amputation. Electronically Signed   By: Ulyses Jarred M.D.   On: 03/30/2021 03:33   DG Knee Complete 4 Views Right  Result Date: 03/30/2021 CLINICAL DATA:  Knee pain, fall EXAM: RIGHT KNEE - COMPLETE 4+ VIEW COMPARISON:  None. FINDINGS: No acute fracture or dislocation. There is mild osteoarthrosis. Soft tissue density anteriorly likely due to venous stasis. Atherosclerotic calcification. No joint effusion. IMPRESSION: No acute fracture or dislocation of the right knee. Mild osteoarthrosis. Electronically Signed   By: Ulyses Jarred M.D.   On: 03/30/2021 03:19   CT Renal Stone Study  Result Date: 04/03/2021 CLINICAL DATA:  Right flank pain and pelvic pain starting on Tuesday. EXAM: CT ABDOMEN AND PELVIS WITHOUT CONTRAST TECHNIQUE: Multidetector CT imaging of the abdomen and pelvis was performed following the standard protocol without IV contrast. COMPARISON:  CT abdomen and pelvis 12/10/2016. CT pelvis 05/17/2019. CT chest 12/04/2020 FINDINGS: Lower chest: The lung bases are clear. Hepatobiliary: Small stones in  the dependent portion of the gallbladder. Layering increased density of the bile consistent with sludge. No gallbladder wall thickening or infiltration. No bile duct dilatation. Liver is moderately enlarged probably due to fatty infiltration. No focal liver lesions. Pancreas: Unremarkable. No pancreatic ductal dilatation or surrounding inflammatory changes. Spleen: Normal in size without focal abnormality. Adrenals/Urinary Tract: Right adrenal gland nodule measuring 2.8 cm diameter. CT density measurements are about 2.6 Hounsfield units, consistent with a benign fat containing adenoma. No change since prior study. Kidneys are symmetrical in size. No hydronephrosis or hydroureter. No renal or ureteral stones identified. The bladder is diffusely thickened suggesting cystitis. Stomach/Bowel: Stomach, small bowel, and colon are not abnormally distended. Appendix is normal. No inflammatory changes or wall thickening appreciated. Vascular/Lymphatic: Aortic atherosclerosis. No enlarged abdominal or pelvic lymph nodes. Reproductive: Uterus and bilateral adnexa are unremarkable. Other: No free air or free fluid in the abdomen. Prominent infiltration in the subcutaneous fat of the anterior abdominal wall involving the pannus. This could represent cellulitis or edema. No loculated collections. Musculoskeletal: Degenerative changes in the spine and hips. No destructive bone lesions. Other: Examination is technically limited due to patient's body habitus. IMPRESSION: 1. No renal or ureteral stone or obstruction. 2. Diffuse bladder wall thickening suggesting cystitis. 3. Prominent infiltration in the subcutaneous fat of the anterior abdominal wall involving the pannus. This could represent cellulitis or edema. No loculated collections. 4. Cholelithiasis and sludge in the gallbladder. 5. Right adrenal gland nodule consistent with benign adenoma. Aortic Atherosclerosis (ICD10-I70.0). Electronically Signed   By: Lucienne Capers  M.D.   On: 04/03/2021 02:43    Microbiology: Recent Results (from the past 240 hour(s))  Resp Panel by RT-PCR (Flu A&B, Covid) Nasopharyngeal Swab     Status: Abnormal   Collection Time: 04/14/21  4:24 PM   Specimen: Nasopharyngeal Swab; Nasopharyngeal(NP) swabs in vial transport medium  Result Value Ref Range Status   SARS Coronavirus 2 by RT PCR POSITIVE (A) NEGATIVE Final    Comment: RESULT CALLED TO, READ BACK BY AND VERIFIED WITH: B.BROOKS, RN AT 1916 ON 05.10.22 BY N.THOMPSON (NOTE) SARS-CoV-2 target nucleic acids are DETECTED.  The SARS-CoV-2 RNA is generally detectable in  upper respiratory specimens during the acute phase of infection. Positive results are indicative of the presence of the identified virus, but do not rule out bacterial infection or co-infection with other pathogens not detected by the test. Clinical correlation with patient history and other diagnostic information is necessary to determine patient infection status. The expected result is Negative.  Fact Sheet for Patients: EntrepreneurPulse.com.au  Fact Sheet for Healthcare Providers: IncredibleEmployment.be  This test is not yet approved or cleared by the [place] FDA and  has been authorized for detection and/or diagnosis of SARS-CoV-2 by FDA under an Emergency Use Authorization (EUA).  This EUA will remain in effect (meaning thi s test can be used) for the duration of  the COVID-19 declaration under Section 564(b)(1) of the Act, 21 U.S.C. section 360bbb-3(b)(1), unless the authorization is terminated or revoked sooner.     Influenza A by PCR NEGATIVE NEGATIVE Final   Influenza B by PCR NEGATIVE NEGATIVE Final    Comment: (NOTE) The Xpert Xpress SARS-CoV-2/FLU/RSV plus assay is intended as an aid in the diagnosis of influenza from Nasopharyngeal swab specimens and should not be used as a sole basis for treatment. Nasal washings and aspirates are  unacceptable for Xpert Xpress SARS-CoV-2/FLU/RSV testing.  Fact Sheet for Patients: EntrepreneurPulse.com.au  Fact Sheet for Healthcare Providers: IncredibleEmployment.be  This test is not yet approved or cleared by the [place] FDA and has been authorized for detection and/or diagnosis of SARS-CoV-2 by FDA under an Emergency Use Authorization (EUA). This EUA will remain in effect (meaning this test can be used) for the duration of the COVID-19 declaration under Section 564(b)(1) of the Act, 21 U.S.C. section 360bbb-3(b)(1), unless the authorization is terminated or revoked.  Performed at [facility], [place] [address] [place], [place] [address]   Urine culture     Status: Abnormal   Collection Time: 04/14/21  4:25 PM   Specimen: Urine, Clean Catch  Result Value Ref Range Status   Specimen Description   Final    URINE, CLEAN CATCH Performed at [facility], [place] [address], [place], [place] [address]    Special Requests   Final    NONE Performed at [facility], [place] [address], [place], [place] [address]    Culture MULTIPLE SPECIES PRESENT, SUGGEST RECOLLECTION (A)  Final   Report Status 04/16/2021 FINAL  Final  Blood Culture (routine x 2)     Status: Abnormal   Collection Time: 04/14/21 10:05 PM   Specimen: BLOOD  Result Value Ref Range Status   Specimen Description   Final    BLOOD LEFT ANTECUBITAL Performed at [facility] [address], [place], [place] [address]    Special Requests   Final    BOTTLES DRAWN AEROBIC AND ANAEROBIC Blood Culture adequate volume Performed at [place] [address] [place], [place] [address]    Culture  Setup Time   Final    GRAM POSITIVE COCCI IN CLUSTERS AEROBIC BOTTLE ONLY CRITICAL RESULT CALLED TO, READ BACK BY AND VERIFIED WITHKeturah Barre [facility] PHARMD 1856 04/16/21 A BROWNING    Culture (A)   Final    MICROCOCCUS SPECIES Standardized susceptibility testing for this organism is not available. Performed at [facility], [place] [address] [place], [place] [address]    Report Status 04/18/2021 FINAL  Final  Blood Culture ID Panel (Reflexed)     Status: None   Collection Time: 04/14/21 10:05 PM  Result Value  Ref Range Status   Enterococcus faecalis NOT DETECTED NOT DETECTED Final   Enterococcus Faecium NOT DETECTED NOT DETECTED Final   Listeria monocytogenes NOT DETECTED NOT DETECTED Final   Staphylococcus species NOT DETECTED NOT DETECTED Final   Staphylococcus aureus (BCID) NOT DETECTED NOT DETECTED Final   Staphylococcus epidermidis NOT DETECTED NOT DETECTED Final   Staphylococcus lugdunensis NOT DETECTED NOT DETECTED Final   Streptococcus species NOT DETECTED NOT DETECTED Final   Streptococcus agalactiae NOT DETECTED NOT DETECTED Final   Streptococcus pneumoniae NOT DETECTED NOT DETECTED Final   Streptococcus pyogenes NOT DETECTED NOT DETECTED Final   A.calcoaceticus-baumannii NOT DETECTED NOT DETECTED Final   Bacteroides fragilis NOT DETECTED NOT DETECTED Final   Enterobacterales NOT DETECTED NOT DETECTED Final   Enterobacter cloacae complex NOT DETECTED NOT DETECTED Final   Escherichia coli NOT DETECTED NOT DETECTED Final   Klebsiella aerogenes NOT DETECTED NOT DETECTED Final   Klebsiella oxytoca NOT DETECTED NOT DETECTED Final   Klebsiella pneumoniae NOT DETECTED NOT DETECTED Final   Proteus species NOT DETECTED NOT DETECTED Final   Salmonella species NOT DETECTED NOT DETECTED Final   Serratia marcescens NOT DETECTED NOT DETECTED Final   Haemophilus influenzae NOT DETECTED NOT DETECTED Final   Neisseria meningitidis NOT DETECTED NOT DETECTED Final   Pseudomonas aeruginosa NOT DETECTED NOT DETECTED Final   Stenotrophomonas maltophilia NOT DETECTED NOT DETECTED Final   Candida albicans NOT DETECTED NOT DETECTED Final   Candida auris NOT DETECTED NOT DETECTED Final    Candida glabrata NOT DETECTED NOT DETECTED Final   Candida krusei NOT DETECTED NOT DETECTED Final   Candida parapsilosis NOT DETECTED NOT DETECTED Final   Candida tropicalis NOT DETECTED NOT DETECTED Final   Cryptococcus neoformans/gattii NOT DETECTED NOT DETECTED Final    Comment: Performed at [facility], [address], [place], [place] [address]  Blood Culture (routine x 2)     Status: None (Preliminary result)   Collection Time: 04/14/21 10:12 PM   Specimen: BLOOD  Result Value Ref Range Status   Specimen Description   Final    BLOOD RIGHT ANTECUBITAL Performed at [place] [address] [place], [place] [address]    Special Requests   Final    BOTTLES DRAWN AEROBIC AND ANAEROBIC Blood Culture adequate volume Performed at [place] [address], [place], [place] [address]    Culture   Final    NO GROWTH 2 DAYS Performed at [place] [address] [place], [place] [address]    Report Status PENDING  Incomplete  MRSA PCR Screening     Status: None   Collection Time: 04/15/21  1:45 PM   Specimen: Nasal Mucosa; Nasopharyngeal  Result Value Ref Range Status   MRSA by PCR NEGATIVE NEGATIVE Final    Comment:        The GeneXpert MRSA Assay (FDA approved for NASAL specimens only), is one component of a comprehensive MRSA colonization surveillance program. It is not intended to diagnose MRSA infection nor to guide or monitor treatment for MRSA infections. Performed at [facility], [place] [address], [place], [place] [address]      Labs: Basic Metabolic Panel: Recent Labs  Lab 04/16/21 1520 04/16/21 1936 04/16/21 2303 04/17/21 1447 04/18/21 0841  NA 136 134* 137 139 140  K 4.0 3.8 3.8 4.1 3.3*  CL 90* 88* 91* 94* 93*  CO2 33* 33* 36* 37* 38*  GLUCOSE 451* 371* 373* 217* 132*  BUN 47* 46* 48* 54* 54*  CREATININE 1.11* 1.12* 1.15* 1.14* 1.11*  CALCIUM 8.8* 8.6* 8.5* 8.6* 8.4*    Liver Function Tests: Recent Labs  Lab 04/14/21 2145 04/15/21 1029 04/16/21 0308 04/18/21 0841  AST 26 35 19 16  ALT '27 31 27 20  ' ALKPHOS 153* 168* 132* 125  BILITOT 0.4 0.1* 0.3 0.3  PROT 8.2* 7.9 8.1 7.7  ALBUMIN 3.0* 3.1* 2.9* 3.1*   No results for input(s): LIPASE, AMYLASE in the last 168 hours. No results for input(s): AMMONIA in the last 168 hours. CBC: Recent Labs  Lab 04/14/21 1623 04/14/21 1715 04/15/21 0419 04/16/21 0308 04/18/21 0841  WBC 6.6  --  9.0 10.8* 9.7  NEUTROABS 3.4  --   --  8.0* 4.5  HGB 11.9* 12.6 11.3* 11.9* 12.4  HCT 38.1 37.0 35.7* 37.8 40.2  MCV 96.7  --  97.5 95.2 96.9  PLT 284  --  264 307 313   Cardiac Enzymes: No results for input(s): CKTOTAL, CKMB, CKMBINDEX, TROPONINI in the last 168 hours. BNP: BNP (last 3 results) Recent Labs    04/15/21 1029 04/16/21 0308 04/18/21 0841  BNP 27.7 56.6 40.1    ProBNP (last 3 results) Recent Labs    11/14/20 1051  PROBNP 35.0    CBG: Recent Labs  Lab 04/17/21 1111 04/17/21 1532 04/17/21 2149 04/18/21 0738 04/18/21 1154  GLUCAP 139* 228* 329* 142* 123*       Signed:  Florencia Reasons MD, PhD, FACP  [facility] 04/18/2021, 2:35 PM

## 2021-04-18 NOTE — TOC Progression Note (Signed)
Transition of Care [facility]) - Progression Note    Patient Details  Name: [name] MRN: [id number] Date of Birth: 10/21/1976  [facility]) CM/SW Contact  Leeroy Cha, RN Phone Number: 04/18/2021, 11:25 AM  Clinical Narrative:    Will try to get [facility] to take insurance is Medicaid prepaid health plan may not be able to find a provider for p.t. at home. Orders faxed to [facility]  Expected Discharge Plan: Home/Self Care Barriers to Discharge: Continued Medical Work up  Expected Discharge Plan and Services Expected Discharge Plan: Home/Self Care   Discharge Planning Services: CM Consult   Living arrangements for the past 2 months: Apartment Expected Discharge Date: 04/18/21                                     Social Determinants of Health (SDOH) Interventions    Readmission Risk Interventions No flowsheet data found.

## 2021-04-20 ENCOUNTER — Telehealth: Payer: Self-pay

## 2021-04-20 LAB — CULTURE, BLOOD (ROUTINE X 2)
Culture: NO GROWTH
Special Requests: ADEQUATE

## 2021-04-20 NOTE — Discharge Summary (Signed)
Discharge Summary  [name] [id number] DOB: 08/15/1976  PCP: Belinda Rio, MD  Admit date: 04/14/2021 Discharge date: 04/18/2021  Time spent:  26mins  Recommendations for Outpatient Follow-up:  1. F/u with PCP within a week  for hospital discharge follow up, repeat cbc/bmp at follow up 2. Home health ordered       Discharge Diagnoses:  Active Hospital Problems   Diagnosis Date Noted  . Pneumonia due to COVID-19 virus 04/14/2021  . Morbid obesity with BMI of 60.0-69.9, adult ([name]) 04/15/2021  . Hypoxia 04/15/2021  . UTI (urinary tract infection) 04/15/2021  . Chronic pain 04/15/2021  . 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[name]) 04/11/2019  . HLD (hyperlipidemia) 11/19/2012  . DM (diabetes mellitus) type II uncontrolled, periph vascular disorder ([name]) 05/22/2008  . Hypertension 05/22/2008    Resolved Hospital Problems  No resolved problems to display.    Discharge Condition: stable  Diet recommendation: heart healthy/carb modified  Filed Weights   04/14/21 1550 04/15/21 1400  Weight: (!) 168 kg (!) 167.7 kg   History of present illness: ( per admitting MD Dr [name]) Chief Complaint:SOB, dysuria  XBM:WUXLK Banksis a 45 y.o.femalewith medical history significant forDMT2, morbid obesity, HTN, HLD, COPD, chronic pain who presents for evaluation of SOB and cough. She reports thatsymptoms began yesterday with a dry cough, loss of taste, nasal congestion and runny nose with body aches. Shortness of breath that is exacerbated with exertion. She is on 2 L of oxygen nasal cannula at home as needed.Was found to have oxygen saturations in the low 80s on room air when she arrived emergency room. She was placed on 2 L oxygen nasal cannula. She reports she has been vaccinated for COVID and has had her booster shot. She has no known exposures to anyone with COVID. She states she is compliant with her medications but her blood sugars are generally above 200 she states.  Was seen in the ER a week ago for dysuria and urinary frequency and was diagnosed with urinary tract infection and treated with Cipro for a week. She completed the antibiotics but continues to have dysuria and frequency. She denies tobacco alcohol or illicit drug use  ED Course:[name] is found to be COVID positive in the emergency room. She is requiring oxygen. Is positive for UTI.WBC 6600, hemoglobin 11.9, hematocrit 30.1 platelets 284, sodium 133, potassium 4.1, creatinine 1.10 with a BUN of 38. Alk phos is 153. AST and ALT are normal. CRP 10.7, lactic acid 2.1, procalcitonin 0.22, D-dimer 0.70 with a fibrinogen 733. Patient was started on remdesivir in the emergency room    Hospital Course:  Principal Problem:   Pneumonia due to COVID-19 virus Active Problems:   DM (diabetes mellitus) type II uncontrolled, periph vascular disorder ([name])   Hypertension   HLD (hyperlipidemia)   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[name])   Morbid obesity with BMI of 60.0-69.9, adult ([name])   Hypoxia   UTI (urinary tract infection)   Chronic pain  COVID infection/acute on chronic hypoxic respiratory failure Presented withshortness of breath, cough, increased oxygen requirement -Was found to have oxygen saturations in the low 80s on room air when she arrived emergency room.  -COVID test:PCR positive on admission -Chest imaging:Chest x-ray on admission no acute infiltrate -received  IV remdesivir  for total of 5 days. Initially started on IV steroids, then on oral prednisone --Supportive care: Vitamin C, Zinc, PRN inhalers,  --inflammatory markers trending down, crp on presentation was 10.7, crp on discharge is 1.8, clinically improved oxygen weaned back  to baseline, discharged home with quick steroid taper F/u with pcp  Insulin-dependent type 2 diabetes, uncontrolled with hyperglycemia -Patient is on Tresiba 90 units twice a day, NovoLog meal coverage 45 unit 3 times daily, also use Trulicity  weekly She wears Dexcom for blood glucose monitoring -A1c greater than 15.5 -On presentation her blood glucose in the 500, after he started steroid treatment blood glucose went up to 700, she is transferred to stepdown started on insulin drip -blood glucose much improved at discharge, restarted back on home regimen   Hypertension On metoprolol   History of peripheral vascular disease Status post left BKA, status post right partial foot amputation  Body mass index is 63.46 kg/m. OSA on cpap at home  DVT prophylaxis:lovenox   Code Status:DNR Family Communication:significant other at bedside, daughter over the phone on 5/12 Disposition:home  Procedures:  none  Consultations:  none  Discharge Exam: BP (!) 133/92   Pulse 72   Temp 98.2 F (36.8 C) (Oral)   Resp 20   Ht 5\' 4"  (1.626 m)   Wt (!) 167.7 kg   SpO2 100%   BMI 63.46 kg/m   General: obese, NAD, AAOx3 Cardiovascular: RRR Respiratory: normal respiratory effort   Discharge Instructions You were cared for by a hospitalist during your hospital stay. If you have any questions about your discharge medications or the care you received while you were in the hospital after you are discharged, you can call the unit and asked to speak with the hospitalist on call if the hospitalist that took care of you is not available. Once you are discharged, your primary care physician will handle any further medical issues. Please note that NO REFILLS for any discharge medications will be authorized once you are discharged, as it is imperative that you return to your primary care physician (or establish a relationship with a primary care physician if you do not have one) for your aftercare needs so that they can reassess your need for medications and monitor your lab values.  Discharge Instructions    Diet Carb Modified   Complete by: As directed    Increase activity slowly   Complete by: As directed       Allergies as of 04/18/2021      Reactions   Cefepime Other (See Comments)   AKI, see records from [facility] hospitalization in January 2020.   Other reaction(s): Other (See Comments) Note pt has tolerated Rocephin and Keflex   Kiwi Extract Shortness Of Breath, Swelling, Anaphylaxis   Morphine And Related Nausea And Vomiting   Nalbuphine    Other reaction(s): Hallucinations, Other (See Comments) "FEELS LIKE SOMETHING CRAWLING ON ME" Reaction: Nervousness "FEELS LIKE SOMETHING CRAWLING ON ME" Reaction: Nervousness Reaction: Nervousness "FEELS LIKE SOMETHING CRAWLING ON ME"   Trental [pentoxifylline] Nausea And Vomiting   Morphine Nausea And Vomiting   Nubain [nalbuphine Hcl] Other (See Comments)   "FEELS LIKE SOMETHING CRAWLING ON ME"      Medication List    STOP taking these medications   ciprofloxacin 500 MG tablet Commonly known as: CIPRO   ipratropium-albuterol 0.5-2.5 (3) MG/3ML Soln Commonly known as: DUONEB   loratadine 10 MG tablet Commonly known as: CLARITIN   ondansetron 4 MG disintegrating tablet Commonly known as: Zofran ODT     TAKE these medications   Accu-Chek Guide test strip Generic drug: glucose blood USE T0 CHECK BLOOD SUGAR 3 TIMES DAILY   Accu-Chek Softclix Lancets lancets Use to check blood glucose four times daily  Accu-Chek FastClix Lancets Misc USE DAILY AS INSTRUCTED   ascorbic acid 250 MG tablet Commonly known as: VITAMIN C Take 1 tablet (250 mg total) by mouth daily.   aspirin EC 81 MG tablet Take 1 tablet (81 mg total) by mouth daily. Swallow whole.   atorvastatin 80 MG tablet Commonly known as: LIPITOR Take 1 tablet (80 mg total) by mouth daily.   budesonide-formoterol 160-4.5 MCG/ACT inhaler Commonly known as: Symbicort Inhale 2 puffs into the lungs 2 (two) times daily.   Cholecalciferol 25 MCG (1000 UT) tablet Take 1 tablet (1,000 Units total) by mouth daily.   Compression Bandages Misc Wear daily for swelling    Dexcom G6 Sensor Misc Inject 1 applicator into the skin as directed. Change sensor every 10 days.   Dexcom G6 Transmitter Misc Inject 1 Device into the skin as directed. Reuse 8 times with sensor changes.   gabapentin 600 MG tablet Commonly known as: NEURONTIN TAKE 2 TABLETS BY MOUTH 3 TIMES DAILY   HYDROcodone-acetaminophen 10-325 MG tablet Commonly known as: NORCO Take 1 tablet by mouth every 8 (eight) hours as needed for moderate pain.   HYDROcodone-acetaminophen 10-325 MG tablet Commonly known as: NORCO Take 1 tablet by mouth every 8 (eight) hours as needed for moderate pain.   HYDROcodone-acetaminophen 10-325 MG tablet Commonly known as: NORCO Take 1 tablet by mouth every 8 (eight) hours as needed for moderate pain.   insulin aspart 100 UNIT/ML FlexPen Commonly known as: NOVOLOG Inject 40 Units into the skin 3 (three) times daily with meals. What changed: how much to take   Iron 325 (65 Fe) MG Tabs Take 1 tablet (325 mg total) by mouth every other day.   metoprolol succinate 25 MG 24 hr tablet Commonly known as: TOPROL-XL Take 25 mg by mouth daily.   naproxen 500 MG tablet Commonly known as: NAPROSYN Take 1 tablet (500 mg total) by mouth 2 (two) times daily as needed for moderate pain.   omeprazole 40 MG capsule Commonly known as: PRILOSEC Take 1 capsule (40 mg total) by mouth daily.   phenazopyridine 200 MG tablet Commonly known as: PYRIDIUM Take 1 tablet (200 mg total) by mouth 3 (three) times daily as needed for pain.   potassium chloride SA 20 MEQ tablet Commonly known as: Klor-Con M20 Take 1 tablet (20 mEq total) by mouth daily.   predniSONE 10 MG tablet Commonly known as: DELTASONE Take $RemoveBefore'30mg'uPQdNAQjKRlIe$  (3tabs) with breakfast on 5/15, then take $RemoveB'20mg'CVesFFtb$  (two tabs)with breakfast on 5/14, then take $RemoveB'10mg'nJToJPrp$   (one tab)with breakfast on 5/15, then take $RemoveB'5mg'vnSmZrER$  (half tab) with breakfast on 5/16, then stop.   rOPINIRole 1 MG tablet Commonly known as: REQUIP Take 1 tablet (1 mg  total) by mouth at bedtime.   Spiriva Respimat 1.25 MCG/ACT Aers Generic drug: Tiotropium Bromide Monohydrate Inhale 2 puffs into the lungs daily.   Sure Comfort Pen Needles 32G X 4 MM Misc Generic drug: Insulin Pen Needle Use to inject insulin as indicated   torsemide 20 MG tablet Commonly known as: DEMADEX TAKE 4 TABLETS (80 MG) BY MOUTH ONCE A DAY   Transfer Bench Misc Use daily   Tresiba FlexTouch 200 UNIT/ML FlexTouch Pen Generic drug: insulin degludec Inject 110 Units into the skin 2 (two) times daily. What changed: how much to take   Trulicity 1.5 BC/4.8GQ Sopn Generic drug: Dulaglutide Inject 1.5 mg into the skin once a week.   zinc sulfate 220 (50 Zn) MG capsule Take 1 capsule (220 mg total) by mouth  daily for 14 days.      Allergies  Allergen Reactions  . Cefepime Other (See Comments)    AKI, see records from [facility] hospitalization in January 2020.   Other reaction(s): Other (See Comments)  Note pt has tolerated Rocephin and Keflex  . Kiwi Extract Shortness Of Breath, Swelling and Anaphylaxis  . Morphine And Related Nausea And Vomiting  . Nalbuphine     Other reaction(s): Hallucinations, Other (See Comments) "FEELS LIKE SOMETHING CRAWLING ON ME" Reaction: Nervousness "FEELS LIKE SOMETHING CRAWLING ON ME" Reaction: Nervousness Reaction: Nervousness "FEELS LIKE SOMETHING CRAWLING ON ME"   . Trental [Pentoxifylline] Nausea And Vomiting  . Morphine Nausea And Vomiting  . Nubain [Nalbuphine Hcl] Other (See Comments)    "FEELS LIKE SOMETHING CRAWLING ON ME"    Follow-up Information    Belinda Rio, MD Follow up in 2 week(s).   Specialty: Family Medicine Why: hospital discharge follow up Contact information: [place] [id number] [phone]                The results of significant diagnostics from this hospitalization (including imaging, microbiology, ancillary and laboratory) are listed below for reference.     Significant Diagnostic Studies: DG Lumbar Spine 2-3 Views  Result Date: 04/17/2021 CLINICAL DATA:  Fall today with low back pain. EXAM: LUMBAR SPINE - 2-3 VIEW COMPARISON:  Reformats from abdominopelvic CT 04/03/2021 FINDINGS: The alignment is maintained. Vertebral body heights are normal. There is no listhesis. The posterior elements are intact. Disc spaces are preserved. No fracture. Multilevel facet hypertrophy. Sacroiliac joints are symmetric with mild degenerative change. IMPRESSION: No fracture or subluxation of the lumbar spine. Electronically Signed   By: Keith Rake M.D.   On: 04/17/2021 19:40   CT HEAD WO CONTRAST  Result Date: 04/17/2021 CLINICAL DATA:  Provided history: Fall, hit her head against chair. Accidental fall. EXAM: CT HEAD WITHOUT CONTRAST TECHNIQUE: Contiguous axial images were obtained from the base of the skull through the vertex without intravenous contrast. COMPARISON:  Brain MRI 07/13/2014.  CT angiogram head 07/13/2014. FINDINGS: Brain: Cerebral volume is normal. There is no acute intracranial hemorrhage. No demarcated cortical infarct. No extra-axial fluid collection. No evidence of intracranial mass. No midline shift. Vascular: No hyperdense vessel.  Atherosclerotic calcifications Skull: Normal. Negative for fracture or focal lesion. Sinuses/Orbits: Visualized orbits show no acute finding. Subcentimeter osteoma within a posterior right ethmoid air cell. Moderate bilateral ethmoid sinus mucosal thickening. Mild mucosal thickening and small volume frothy secretions within the left sphenoid sinus. Trace right sphenoid sinus mucosal thickening. Mild bilateral maxillary sinus mucosal thickening. IMPRESSION: Unremarkable non-contrast CT appearance of the brain. No evidence of acute intracranial abnormality. Paranasal sinus disease, as described. Electronically Signed   By: Kellie Simmering DO   On: 04/17/2021 17:12   DG Chest Port 1 View  Result Date: 04/14/2021 CLINICAL  DATA:  Cough and body aches.  Shortness of breath. EXAM: PORTABLE CHEST 1 VIEW COMPARISON:  Most recent radiograph 11/17/2020. Most recent CT 11/07/2020 FINDINGS: Borderline cardiomegaly. Unchanged mediastinal contours. No acute airspace disease. Left basilar assessment is limited by soft tissue attenuation. No significant pleural effusion. No pneumothorax. Stable osseous structures IMPRESSION: Borderline cardiomegaly. No acute pulmonary process. Electronically Signed   By: Keith Rake M.D.   On: 04/14/2021 17:34   DG Knee Complete 4 Views Left  Result Date: 03/30/2021 CLINICAL DATA:  Left knee pain after fall EXAM: LEFT KNEE - COMPLETE 4+ VIEW COMPARISON:  None. FINDINGS: Surgical anchor in  the lateral left tibia. Status post below-knee amputation. No acute fracture. IMPRESSION: No acute fracture or dislocation of the left knee. Status post below-knee amputation. Electronically Signed   By: Ulyses Jarred M.D.   On: 03/30/2021 03:33   DG Knee Complete 4 Views Right  Result Date: 03/30/2021 CLINICAL DATA:  Knee pain, fall EXAM: RIGHT KNEE - COMPLETE 4+ VIEW COMPARISON:  None. FINDINGS: No acute fracture or dislocation. There is mild osteoarthrosis. Soft tissue density anteriorly likely due to venous stasis. Atherosclerotic calcification. No joint effusion. IMPRESSION: No acute fracture or dislocation of the right knee. Mild osteoarthrosis. Electronically Signed   By: Ulyses Jarred M.D.   On: 03/30/2021 03:19   CT Renal Stone Study  Result Date: 04/03/2021 CLINICAL DATA:  Right flank pain and pelvic pain starting on Tuesday. EXAM: CT ABDOMEN AND PELVIS WITHOUT CONTRAST TECHNIQUE: Multidetector CT imaging of the abdomen and pelvis was performed following the standard protocol without IV contrast. COMPARISON:  CT abdomen and pelvis 12/10/2016. CT pelvis 05/17/2019. CT chest 12/04/2020 FINDINGS: Lower chest: The lung bases are clear. Hepatobiliary: Small stones in the dependent portion of the  gallbladder. Layering increased density of the bile consistent with sludge. No gallbladder wall thickening or infiltration. No bile duct dilatation. Liver is moderately enlarged probably due to fatty infiltration. No focal liver lesions. Pancreas: Unremarkable. No pancreatic ductal dilatation or surrounding inflammatory changes. Spleen: Normal in size without focal abnormality. Adrenals/Urinary Tract: Right adrenal gland nodule measuring 2.8 cm diameter. CT density measurements are about 2.6 Hounsfield units, consistent with a benign fat containing adenoma. No change since prior study. Kidneys are symmetrical in size. No hydronephrosis or hydroureter. No renal or ureteral stones identified. The bladder is diffusely thickened suggesting cystitis. Stomach/Bowel: Stomach, small bowel, and colon are not abnormally distended. Appendix is normal. No inflammatory changes or wall thickening appreciated. Vascular/Lymphatic: Aortic atherosclerosis. No enlarged abdominal or pelvic lymph nodes. Reproductive: Uterus and bilateral adnexa are unremarkable. Other: No free air or free fluid in the abdomen. Prominent infiltration in the subcutaneous fat of the anterior abdominal wall involving the pannus. This could represent cellulitis or edema. No loculated collections. Musculoskeletal: Degenerative changes in the spine and hips. No destructive bone lesions. Other: Examination is technically limited due to patient's body habitus. IMPRESSION: 1. No renal or ureteral stone or obstruction. 2. Diffuse bladder wall thickening suggesting cystitis. 3. Prominent infiltration in the subcutaneous fat of the anterior abdominal wall involving the pannus. This could represent cellulitis or edema. No loculated collections. 4. Cholelithiasis and sludge in the gallbladder. 5. Right adrenal gland nodule consistent with benign adenoma. Aortic Atherosclerosis (ICD10-I70.0). Electronically Signed   By: Lucienne Capers M.D.   On: 04/03/2021 02:43     Microbiology: Recent Results (from the past 240 hour(s))  Resp Panel by RT-PCR (Flu A&B, Covid) Nasopharyngeal Swab     Status: Abnormal   Collection Time: 04/14/21  4:24 PM   Specimen: Nasopharyngeal Swab; Nasopharyngeal(NP) swabs in vial transport medium  Result Value Ref Range Status   SARS Coronavirus 2 by RT PCR POSITIVE (A) NEGATIVE Final    Comment: RESULT CALLED TO, READ BACK BY AND VERIFIED WITH: B.BROOKS, RN AT 1916 ON 05.10.22 BY N.THOMPSON (NOTE) SARS-CoV-2 target nucleic acids are DETECTED.  The SARS-CoV-2 RNA is generally detectable in upper respiratory specimens during the acute phase of infection. Positive results are indicative of the presence of the identified virus, but do not rule out bacterial infection or co-infection with other pathogens not detected by the test.  Clinical correlation with patient history and other diagnostic information is necessary to determine patient infection status. The expected result is Negative.  Fact Sheet for Patients: EntrepreneurPulse.com.au  Fact Sheet for Healthcare Providers: IncredibleEmployment.be  This test is not yet approved or cleared by the [place] FDA and  has been authorized for detection and/or diagnosis of SARS-CoV-2 by FDA under an Emergency Use Authorization (EUA).  This EUA will remain in effect (meaning thi s test can be used) for the duration of  the COVID-19 declaration under Section 564(b)(1) of the Act, 21 U.S.C. section 360bbb-3(b)(1), unless the authorization is terminated or revoked sooner.     Influenza A by PCR NEGATIVE NEGATIVE Final   Influenza B by PCR NEGATIVE NEGATIVE Final    Comment: (NOTE) The Xpert Xpress SARS-CoV-2/FLU/RSV plus assay is intended as an aid in the diagnosis of influenza from Nasopharyngeal swab specimens and should not be used as a sole basis for treatment. Nasal washings and aspirates are unacceptable for Xpert Xpress  SARS-CoV-2/FLU/RSV testing.  Fact Sheet for Patients: EntrepreneurPulse.com.au  Fact Sheet for Healthcare Providers: IncredibleEmployment.be  This test is not yet approved or cleared by the [place] FDA and has been authorized for detection and/or diagnosis of SARS-CoV-2 by FDA under an Emergency Use Authorization (EUA). This EUA will remain in effect (meaning this test can be used) for the duration of the COVID-19 declaration under Section 564(b)(1) of the Act, 21 U.S.C. section 360bbb-3(b)(1), unless the authorization is terminated or revoked.  Performed at [facility], [place] [address] [place], [place] [address]   Urine culture     Status: Abnormal   Collection Time: 04/14/21  4:25 PM   Specimen: Urine, Clean Catch  Result Value Ref Range Status   Specimen Description   Final    URINE, CLEAN CATCH Performed at [facility], [place] [address] [place], [place] [address]    Special Requests   Final    NONE Performed at [facility], [name] [address], [place], [place] [address]    Culture MULTIPLE SPECIES PRESENT, SUGGEST RECOLLECTION (A)  Final   Report Status 04/16/2021 FINAL  Final  Blood Culture (routine x 2)     Status: Abnormal   Collection Time: 04/14/21 10:05 PM   Specimen: BLOOD  Result Value Ref Range Status   Specimen Description   Final    BLOOD LEFT ANTECUBITAL Performed at [facility] [address], [place], [place] [address]    Special Requests   Final    BOTTLES DRAWN AEROBIC AND ANAEROBIC Blood Culture adequate volume Performed at [facility] [place] [address] [place], [place] [address]    Culture  Setup Time   Final    GRAM POSITIVE COCCI IN CLUSTERS AEROBIC BOTTLE ONLY CRITICAL RESULT CALLED TO, READ BACK BY AND VERIFIED WITHKeturah Barre [facility] PHARMD 4944 04/16/21 A BROWNING    Culture (A)  Final    MICROCOCCUS  SPECIES Standardized susceptibility testing for this organism is not available. Performed at [facility], [place] [address] [place], [place] [address]    Report Status 04/18/2021 FINAL  Final  Blood Culture ID Panel (Reflexed)     Status: None   Collection Time: 04/14/21 10:05 PM  Result Value Ref Range Status   Enterococcus faecalis NOT DETECTED NOT DETECTED Final   Enterococcus Faecium NOT DETECTED NOT DETECTED Final   Listeria monocytogenes NOT DETECTED NOT DETECTED Final   Staphylococcus species NOT DETECTED NOT  DETECTED Final   Staphylococcus aureus (BCID) NOT DETECTED NOT DETECTED Final   Staphylococcus epidermidis NOT DETECTED NOT DETECTED Final   Staphylococcus lugdunensis NOT DETECTED NOT DETECTED Final   Streptococcus species NOT DETECTED NOT DETECTED Final   Streptococcus agalactiae NOT DETECTED NOT DETECTED Final   Streptococcus pneumoniae NOT DETECTED NOT DETECTED Final   Streptococcus pyogenes NOT DETECTED NOT DETECTED Final   A.calcoaceticus-baumannii NOT DETECTED NOT DETECTED Final   Bacteroides fragilis NOT DETECTED NOT DETECTED Final   Enterobacterales NOT DETECTED NOT DETECTED Final   Enterobacter cloacae complex NOT DETECTED NOT DETECTED Final   Escherichia coli NOT DETECTED NOT DETECTED Final   Klebsiella aerogenes NOT DETECTED NOT DETECTED Final   Klebsiella oxytoca NOT DETECTED NOT DETECTED Final   Klebsiella pneumoniae NOT DETECTED NOT DETECTED Final   Proteus species NOT DETECTED NOT DETECTED Final   Salmonella species NOT DETECTED NOT DETECTED Final   Serratia marcescens NOT DETECTED NOT DETECTED Final   Haemophilus influenzae NOT DETECTED NOT DETECTED Final   Neisseria meningitidis NOT DETECTED NOT DETECTED Final   Pseudomonas aeruginosa NOT DETECTED NOT DETECTED Final   Stenotrophomonas maltophilia NOT DETECTED NOT DETECTED Final   Candida albicans NOT DETECTED NOT DETECTED Final   Candida auris NOT DETECTED NOT DETECTED Final   Candida glabrata  NOT DETECTED NOT DETECTED Final   Candida krusei NOT DETECTED NOT DETECTED Final   Candida parapsilosis NOT DETECTED NOT DETECTED Final   Candida tropicalis NOT DETECTED NOT DETECTED Final   Cryptococcus neoformans/gattii NOT DETECTED NOT DETECTED Final    Comment: Performed at [facility], [address] [place], [place] [address]  Blood Culture (routine x 2)     Status: None (Preliminary result)   Collection Time: 04/14/21 10:12 PM   Specimen: BLOOD  Result Value Ref Range Status   Specimen Description   Final    BLOOD RIGHT ANTECUBITAL Performed at [facility] [address], [place], [place] [address]    Special Requests   Final    BOTTLES DRAWN AEROBIC AND ANAEROBIC Blood Culture adequate volume Performed at [place] [address] [place], [place] [address]    Culture   Final    NO GROWTH 4 DAYS Performed at [facility], [place] [address], [place], [place] [address]    Report Status PENDING  Incomplete  MRSA PCR Screening     Status: None   Collection Time: 04/15/21  1:45 PM   Specimen: Nasal Mucosa; Nasopharyngeal  Result Value Ref Range Status   MRSA by PCR NEGATIVE NEGATIVE Final    Comment:        The GeneXpert MRSA Assay (FDA approved for NASAL specimens only), is one component of a comprehensive MRSA colonization surveillance program. It is not intended to diagnose MRSA infection nor to guide or monitor treatment for MRSA infections. Performed at [facility], [place] [address], [place], [place] [address]      Labs: Basic Metabolic Panel: Recent Labs  Lab 04/16/21 1520 04/16/21 1936 04/16/21 2303 04/17/21 1447 04/18/21 0841  NA 136 134* 137 139 140  K 4.0 3.8 3.8 4.1 3.3*  CL 90* 88* 91* 94* 93*  CO2 33* 33* 36* 37* 38*  GLUCOSE 451* 371* 373* 217* 132*  BUN 47* 46* 48* 54* 54*  CREATININE 1.11* 1.12* 1.15* 1.14* 1.11*  CALCIUM 8.8* 8.6* 8.5* 8.6* 8.4*   Liver Function  Tests: Recent Labs  Lab 04/14/21 2145 04/15/21 1029 04/16/21 0308  04/18/21 0841  AST 26 35 19 16  ALT _0 ALKPHOS 153* 168* 132* 125  BILITOT 0.4 0.1* 0.3 0.3  PROT 8.2* 7.9 8.1 7.7  ALBUMIN 3.0* 3.1* 2.9* 3.1*   No results for input(s): LIPASE, AMYLASE in the last 168 hours. No results for input(s): AMMONIA in the last 168 hours. CBC: Recent Labs  Lab 04/14/21 1623 04/14/21 1715 04/15/21 0419 04/16/21 0308 04/18/21 0841  WBC 6.6  --  9.0 10.8* 9.7  NEUTROABS 3.4  --   --  8.0* 4.5  HGB 11.9* 12.6 11.3* 11.9* 12.4  HCT 38.1 37.0 35.7* 37.8 40.2  MCV 96.7  --  97.5 95.2 96.9  PLT 284  --  264 307 313   Cardiac Enzymes: No results for input(s): CKTOTAL, CKMB, CKMBINDEX, TROPONINI in the last 168 hours. BNP: BNP (last 3 results) Recent Labs    04/15/21 1029 04/16/21 0308 04/18/21 0841  BNP 27.7 56.6 40.1    ProBNP (last 3 results) Recent Labs    11/14/20 1051  PROBNP 35.0    CBG: Recent Labs  Lab 04/17/21 1111 04/17/21 1532 04/17/21 2149 04/18/21 0738 04/18/21 1154  GLUCAP 139* 228* 329* 142* 123*       Signed:  Florencia Reasons MD, PhD, FACP  [facility] 04/20/2021, 10:23 AM

## 2021-04-20 NOTE — Telephone Encounter (Signed)
Transition Care Management Follow-up Telephone Call  Date of discharge and from where: 04/18/2021 from [facility]  How have you been since you were released from the hospital? Pt stated that she is having some chest pain today. Pt states that she is not wearing her O2 and her SATs are at   Any questions or concerns? No  Items Reviewed:  Did the pt receive and understand the discharge instructions provided? Yes   Medications obtained and verified? Yes   Other? No   Any new allergies since your discharge? No   Dietary orders reviewed? Carb modified  Do you have support at home? Yes   Functional Questionnaire: (I = Independent and D = Dependent) ADLs: D  Bathing/Dressing- D  Meal Prep- D  Eating- I  Maintaining continence- D  Transferring/Ambulation- D - wheelchair  Managing Meds- I   Follow up appointments reviewed:   PCP Hospital f/u appt confirmed? No  Pt will call to schedule  Specialist Hospital f/u appt confirmed? No    Are transportation arrangements needed? No    If their condition worsens, is the pt aware to call PCP or go to the Emergency Dept.? Yes  Was the patient provided with contact information for the PCP's office or ED? Yes  Was to pt encouraged to call back with questions or concerns? Yes

## 2021-04-24 ENCOUNTER — Encounter: Payer: Self-pay | Admitting: Family Medicine

## 2021-04-24 ENCOUNTER — Other Ambulatory Visit: Payer: Self-pay

## 2021-04-24 ENCOUNTER — Ambulatory Visit (INDEPENDENT_AMBULATORY_CARE_PROVIDER_SITE_OTHER): Payer: Medicaid Other | Admitting: Family Medicine

## 2021-04-24 VITALS — BP 118/86 | HR 68

## 2021-04-24 DIAGNOSIS — J9611 Chronic respiratory failure with hypoxia: Secondary | ICD-10-CM | POA: Diagnosis not present

## 2021-04-24 DIAGNOSIS — J1282 Pneumonia due to coronavirus disease 2019: Secondary | ICD-10-CM

## 2021-04-24 DIAGNOSIS — U071 COVID-19: Secondary | ICD-10-CM | POA: Diagnosis not present

## 2021-04-24 NOTE — Patient Instructions (Addendum)
I am glad that you are feeling better overall. -Please check in with [facility] pulmonology as its been a while since you have been seen. -Please make an appointment with Dr. [name] to follow-up with T2DM -We will call you to let you know if you have any abnormalities on your testing today - call the pulmonology office to discuss your bipap. It looks like you asked them about it in March. They have the contact information for your DME company

## 2021-04-24 NOTE — Assessment & Plan Note (Signed)
Patient presenting for hospital follow-up.  She is overall doing well and is back at her baseline.  She does report some central chest pain with the BiPAP.  I do not think that this is cardiac in nature.  She has had this issue before and discussed with pulmonology.  She does not have any information on reaching [facility] for possible titration of her BiPAP machine. I have recommended that she call pulmonology so that they can reach out to her [facility] provider.  In general, she is due for follow-up with [facility] pulmonology and have encouraged her to schedule an appointment with them. Recommendations for follow-up include repeat CBC and BMP.  It appears her labs were stable prior to discharge.  We will repeat BMP given hypokalemia noted.  Patient reports she is taking her potassium.  Full medication reconciliation was done during today's appointment.

## 2021-04-24 NOTE — Progress Notes (Signed)
    SUBJECTIVE:   CHIEF COMPLAINT / HPI:   Hospital Follow up  COVID 19 pneumonia  5/11-5/14  Patient reports overall doing better. Not usuing O2 at all (which is her baseline). O2 was started last July after hospitalization.  She reports finishing her prednisone taper. Continues to use Spiriva and symbicort daily.  She follows with [facility], who she has not seen since December 2021.  (Appears she has had multiple missed visits since). Patient's only new complaint today is central chest pain after waking up.  This started a few days ago.  She reports that it improves immediately after taking off the BiPAP.  She thought it was due to getting too much oxygen and she tried doing no oxygen with BiPAP overnight, with no improvement.  It does not happen during any other part of the day or with exertion.  Patient does not remember the last time her BiPAP was titrated.  On further chart review, appears that patient complained of this in March and there was supposed to be follow-up with [facility].  Recommendations for Outpatient Follow-up: 1. F/u with PCP within a week for hospital discharge follow up, repeat cbc/bmp at follow up 2. Home health ordered   PERTINENT  PMH / PSH: Type 2 diabetes, poorly controlled, hypertension, hyperlipidemia, left BKA, morbid obesity, hypoxia, chronic respiratory failure, chronic pain  OBJECTIVE:   BP 118/86   Pulse 68   SpO2 90%   General: Well-appearing, nontoxic, in wheelchair.  Obese. Lungs: Clear to auscultation bilaterally.  No wheezes or crackles appreciated.  She was satting 90% when initially checked and improved with 2 L to 96%.  She is breathing comfortably, speaking in full sentences, without respiratory distress. Chest: No tenderness to palpation of anterior chest.  No crepitus or signs of infection appreciated. MSK: Moving extremities spontaneously.  Left BKA, right transmetatarsal amputation.  No lower extremity  edema.  ASSESSMENT/PLAN:   Pneumonia due to COVID-19 virus Patient presenting for hospital follow-up.  She is overall doing well and is back at her baseline.  She does report some central chest pain with the BiPAP.  I do not think that this is cardiac in nature.  She has had this issue before and discussed with pulmonology.  She does not have any information on reaching [facility] for possible titration of her BiPAP machine. I have recommended that she call pulmonology so that they can reach out to her [facility] provider.  In general, she is due for follow-up with [facility] pulmonology and have encouraged her to schedule an appointment with them. Recommendations for follow-up include repeat CBC and BMP.  It appears her labs were stable prior to discharge.  We will repeat BMP given hypokalemia noted.  Patient reports she is taking her potassium.  Full medication reconciliation was done during today's appointment.   Also recommended that patient follow-up with Dr. [name] for 1 month diabetes follow-up.  Wilber Oliphant, MD [place]

## 2021-04-25 LAB — BASIC METABOLIC PANEL
BUN/Creatinine Ratio: 45 — ABNORMAL HIGH (ref 9–23)
BUN: 49 mg/dL — ABNORMAL HIGH (ref 6–24)
CO2: 29 mmol/L (ref 20–29)
Calcium: 9.4 mg/dL (ref 8.7–10.2)
Chloride: 93 mmol/L — ABNORMAL LOW (ref 96–106)
Creatinine, Ser: 1.09 mg/dL — ABNORMAL HIGH (ref 0.57–1.00)
Glucose: 318 mg/dL — ABNORMAL HIGH (ref 65–99)
Potassium: 5.2 mmol/L (ref 3.5–5.2)
Sodium: 139 mmol/L (ref 134–144)
eGFR: 65 mL/min/{1.73_m2} (ref >59)

## 2021-04-25 LAB — CBC
Hematocrit: 39.7 % (ref 34.0–46.6)
Hemoglobin: 12.4 g/dL (ref 11.1–15.9)
MCH: 30 pg (ref 26.6–33.0)
MCHC: 31.2 g/dL — ABNORMAL LOW (ref 31.5–35.7)
MCV: 96 fL (ref 79–97)
Platelets: 453 10*3/uL — ABNORMAL HIGH (ref 150–450)
RBC: 4.13 x10E6/uL (ref 3.77–5.28)
RDW: 13.6 % (ref 11.7–15.4)
WBC: 10.5 10*3/uL (ref 3.4–10.8)

## 2021-04-27 ENCOUNTER — Other Ambulatory Visit: Payer: Self-pay

## 2021-04-27 ENCOUNTER — Emergency Department (HOSPITAL_COMMUNITY)
Admission: EM | Admit: 2021-04-27 | Discharge: 2021-04-27 | Disposition: A | Discharge status: Home / Self Care | Payer: Medicaid Other | Attending: Emergency Medicine | Admitting: Emergency Medicine

## 2021-04-27 ENCOUNTER — Emergency Department (HOSPITAL_COMMUNITY): Payer: Medicaid Other

## 2021-04-27 ENCOUNTER — Encounter: Payer: Self-pay | Admitting: Family Medicine

## 2021-04-27 DIAGNOSIS — E785 Hyperlipidemia, unspecified: Secondary | ICD-10-CM | POA: Insufficient documentation

## 2021-04-27 DIAGNOSIS — Z87891 Personal history of nicotine dependence: Secondary | ICD-10-CM | POA: Insufficient documentation

## 2021-04-27 DIAGNOSIS — R0602 Shortness of breath: Secondary | ICD-10-CM | POA: Diagnosis not present

## 2021-04-27 DIAGNOSIS — J45909 Unspecified asthma, uncomplicated: Secondary | ICD-10-CM | POA: Insufficient documentation

## 2021-04-27 DIAGNOSIS — R059 Cough, unspecified: Secondary | ICD-10-CM | POA: Diagnosis not present

## 2021-04-27 DIAGNOSIS — Z794 Long term (current) use of insulin: Secondary | ICD-10-CM | POA: Diagnosis not present

## 2021-04-27 DIAGNOSIS — E1169 Type 2 diabetes mellitus with other specified complication: Secondary | ICD-10-CM | POA: Diagnosis not present

## 2021-04-27 DIAGNOSIS — Z7951 Long term (current) use of inhaled steroids: Secondary | ICD-10-CM | POA: Diagnosis not present

## 2021-04-27 DIAGNOSIS — Z7982 Long term (current) use of aspirin: Secondary | ICD-10-CM | POA: Insufficient documentation

## 2021-04-27 DIAGNOSIS — Z8616 Personal history of COVID-19: Secondary | ICD-10-CM | POA: Diagnosis not present

## 2021-04-27 DIAGNOSIS — R0789 Other chest pain: Secondary | ICD-10-CM | POA: Insufficient documentation

## 2021-04-27 DIAGNOSIS — Z7984 Long term (current) use of oral hypoglycemic drugs: Secondary | ICD-10-CM | POA: Diagnosis not present

## 2021-04-27 DIAGNOSIS — J449 Chronic obstructive pulmonary disease, unspecified: Secondary | ICD-10-CM | POA: Insufficient documentation

## 2021-04-27 DIAGNOSIS — I10 Essential (primary) hypertension: Secondary | ICD-10-CM | POA: Insufficient documentation

## 2021-04-27 DIAGNOSIS — E1142 Type 2 diabetes mellitus with diabetic polyneuropathy: Secondary | ICD-10-CM | POA: Insufficient documentation

## 2021-04-27 DIAGNOSIS — Z79899 Other long term (current) drug therapy: Secondary | ICD-10-CM | POA: Diagnosis not present

## 2021-04-27 LAB — CBC WITH DIFFERENTIAL/PLATELET
Abs Immature Granulocytes: 0.1 10*3/uL — ABNORMAL HIGH (ref 0.00–0.07)
Basophils Absolute: 0 10*3/uL (ref 0.0–0.1)
Basophils Relative: 0 %
Eosinophils Absolute: 0.2 10*3/uL (ref 0.0–0.5)
Eosinophils Relative: 2 %
HCT: 38.7 % (ref 36.0–46.0)
Hemoglobin: 11.8 g/dL — ABNORMAL LOW (ref 12.0–15.0)
Immature Granulocytes: 1 %
Lymphocytes Relative: 29 %
Lymphs Abs: 2.8 10*3/uL (ref 0.7–4.0)
MCH: 30.1 pg (ref 26.0–34.0)
MCHC: 30.5 g/dL (ref 30.0–36.0)
MCV: 98.7 fL (ref 80.0–100.0)
Monocytes Absolute: 1 10*3/uL (ref 0.1–1.0)
Monocytes Relative: 10 %
Neutro Abs: 5.5 10*3/uL (ref 1.7–7.7)
Neutrophils Relative %: 58 %
Platelets: 416 10*3/uL — ABNORMAL HIGH (ref 150–400)
RBC: 3.92 MIL/uL (ref 3.87–5.11)
RDW: 14 % (ref 11.5–15.5)
WBC: 9.6 10*3/uL (ref 4.0–10.5)
nRBC: 0 % (ref 0.0–0.2)

## 2021-04-27 LAB — COMPREHENSIVE METABOLIC PANEL
ALT: 23 U/L (ref 0–44)
AST: 15 U/L (ref 15–41)
Albumin: 2.7 g/dL — ABNORMAL LOW (ref 3.5–5.0)
Alkaline Phosphatase: 100 U/L (ref 38–126)
Anion gap: 9 (ref 5–15)
BUN: 32 mg/dL — ABNORMAL HIGH (ref 6–20)
CO2: 32 mmol/L (ref 22–32)
Calcium: 9 mg/dL (ref 8.9–10.3)
Chloride: 95 mmol/L — ABNORMAL LOW (ref 98–111)
Creatinine, Ser: 0.98 mg/dL (ref 0.44–1.00)
GFR, Estimated: >60 mL/min (ref >60)
Glucose, Bld: 278 mg/dL — ABNORMAL HIGH (ref 70–99)
Potassium: 4.4 mmol/L (ref 3.5–5.1)
Sodium: 136 mmol/L (ref 135–145)
Total Bilirubin: 0.7 mg/dL (ref 0.3–1.2)
Total Protein: 7 g/dL (ref 6.5–8.1)

## 2021-04-27 LAB — BRAIN NATRIURETIC PEPTIDE: B Natriuretic Peptide: 16.3 pg/mL (ref 0.0–100.0)

## 2021-04-27 LAB — TROPONIN I (HIGH SENSITIVITY)
Troponin I (High Sensitivity): 22 ng/L — ABNORMAL HIGH (ref <18)
Troponin I (High Sensitivity): 19 ng/L — ABNORMAL HIGH (ref <18)

## 2021-04-27 NOTE — Discharge Instructions (Addendum)
Call your pulmonologist today to ask about your CPAP settings. Follow-up with your normal doctor closely. You can try Tums in the morning for chest pain. Come back for worsening chest pain, shortness of breath, or any other emergent medical condition.

## 2021-04-27 NOTE — ED Provider Notes (Addendum)
Emergency Medicine Provider Triage Evaluation Note  [name] , a 45 y.o. female  was evaluated in triage.  Pt complains of chest pain that awakened her from sleep.  Reports she has been feeling bad for a couple of days.  Diagnosed with covid about 2 weeks ago.  Has hx of CHF.  84% on RA.  Review of Systems  Positive: CP Negative: Fever  Physical Exam  There were no vitals taken for this visit. Gen:   Awake, no distress   Resp:  Normal effort  Neuro:  Follows commands  Medical Decision Making  Medically screening exam initiated at 5:35 AM.  Appropriate orders placed.  [name] was informed that the remainder of the evaluation will be completed by another provider, this initial triage assessment does not replace that evaluation, and the importance of remaining in the ED until their evaluation is complete.  Patient started on O2.  Secure to chat to charge RN to help expedite room given hypoxia.  Not in distress.   Montine Circle, PA-C 04/27/21 0551    Montine Circle, PA-C 56/43/32 [id number]    Delora Fuel, MD 84/16/60 [id number]

## 2021-04-27 NOTE — ED Notes (Signed)
Pt. SPO2 low, RN notified. Pt. Put on 2 liter oxygen

## 2021-04-27 NOTE — ED Provider Notes (Signed)
[facility] EMERGENCY DEPARTMENT Provider Note   CSN: [id number] Arrival date & time: 04/27/21  0531     History Chief Complaint  Patient presents with  . Chest Pain    [name] is a 45 y.o. female.  HPI 45 year old female with history of morbid obesity, COPD, diabetes, GERD, hyperlipidemia, hypertension, OSA on CPAP, and SVT presents the emergency department for chest pain.  She states for the last 2 weeks since she was diagnosed with COVID, she has had chest pain every morning when she wakes up.  States that sometimes this of breath that wakes her from sleep.  Believes associated with wearing her CPAP for her OSA.  Feels like it is relieved when she takes her CPAP off and sits up and breathes deeply for the first few minutes of the day.  Today, the pain did not go away like it has been before which is why she presented to the emergency department.  Has not taken anything for the pain.  Denies history of chest pain like this prior to the 2 weeks.  Does report occasional cough, but states this is improving.  Occasionally feels short of breath with the chest pain.  No diaphoresis, fever, urinary changes, bowel changes, abdominal changes.    Past Medical History:  Diagnosis Date  . Acute osteomyelitis of ankle or foot, left ([place]) 01/31/2018  . Alveolar hypoventilation   . Anemia    not on iron pill  . Asthma   . Bipolar 2 disorder ([place])   . Carpal tunnel syndrome on right    recurrent  . Cellulitis 08/2010-08/2011  . Chronic pain   . COPD (chronic obstructive pulmonary disease) ([name])    Symbicort daily and Proventil as needed  . Costochondritis   . Diabetes mellitus type II, uncontrolled ([place]) 2000   Type 2, Uncontrolled.Takes Lantus daily.Fasting blood sugar runs 150  . Drug-seeking behavior   . GERD (gastroesophageal reflux disease)    takes Pantoprazole and Zantac daily  . HLD (hyperlipidemia)    takes Atorvastatin daily  . Hypertension    takes  Lisinopril and Coreg daily  . Morbid obesity ([place])   . Nocturia   . OSA on CPAP   . Peripheral neuropathy    takes Gabapentin daily  . Pneumonia    "walking" several yrs ago and as a baby (12/05/2018)  . Rectal fissure   . Restless leg   . SVT (supraventricular tachycardia) ([place])   . Syncope 02/25/2016  . Urinary frequency   . Urinary incontinence 10/23/2020  . Varicose veins    Right medial thigh and Left leg     Patient Active Problem List   Diagnosis Date Noted  . Morbid obesity with BMI of 60.0-69.9, adult ([place]) 04/15/2021  . Hypoxia 04/15/2021  . UTI (urinary tract infection) 04/15/2021  . Chronic pain 04/15/2021  . Pneumonia due to COVID-19 virus 04/14/2021  . Urinary incontinence 10/23/2020  . Elevated troponin   . Acute respiratory failure with hypoxia and hypercarbia ([place]) 06/15/2020  . Chronic respiratory failure with hypoxia ([place]) 06/14/2020  . Blister of foot 04/21/2020  . Exposure to mold 04/21/2020  . Constipation 04/03/2020  . Left shoulder pain 06/23/2019  . Hyperglycemia due to diabetes mellitus ([place])   . 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[place]) 04/11/2019  . Allergic rhinitis 03/06/2018  . Class 3 severe obesity due to excess calories with serious comorbidity and body mass index (BMI) of 50.0 to 59.9 in adult [facility])   . Decreased pedal  pulses 06/10/2017  . Unilateral primary osteoarthritis, right knee 10/22/2016  . Primary osteoarthritis of first carpometacarpal joint of left hand 07/30/2016  . Diabetic neuropathy ([place]) 07/14/2016  . Nausea 03/17/2016  . De Quervain's tenosynovitis, bilateral 11/01/2015  . Vitamin D deficiency 09/05/2015  . Recurrent candidiasis of vagina 09/05/2015  . Varicose veins of leg with complications [id number]  . Restless leg syndrome 10/17/2014  . Chronic sinusitis 07/18/2014  . Encounter for chronic pain management 06/30/2013  . HLD (hyperlipidemia) 11/19/2012  . Angina pectoris ([place]) 06/27/2012  . Abscess of skin and subcutaneous  tissue 11/04/2011  . Right carpal tunnel syndrome 09/01/2011  . Bilateral knee pain 09/01/2011  . DM (diabetes mellitus) type II uncontrolled, periph vascular disorder ([place]) 05/22/2008  . Morbid obesity ([place]) 05/22/2008  . Obesity hypoventilation syndrome ([place]) 05/22/2008  . Mood disorder ([place]) 05/22/2008  . Obstructive sleep apnea 05/22/2008  . Hypertension 05/22/2008  . Asthma 05/22/2008  . GERD 05/22/2008    Past Surgical History:  Procedure Laterality Date  . AMPUTATION Left 02/01/2018   Procedure: LEFT FOURTH AND 5TH TOE RAY AMPUTATION;  Surgeon: [name], MD;  Location: [facility];  Service: Orthopedics;  Laterality: Left;  . AMPUTATION Left 03/03/2018   Procedure: LEFT BELOW KNEE AMPUTATION;  Surgeon: [name], MD;  Location: [facility];  Service: Orthopedics;  Laterality: Left;  . CARPAL TUNNEL RELEASE Bilateral   . CESAREAN SECTION  2007  . CORONARY ANGIOGRAPHY N/A 11/26/2020   Procedure: CORONARY ANGIOGRAPHY;  Surgeon: Sherren Mocha, MD;  Location: [facility] CV [facility];  Service: Cardiovascular;  Laterality: N/A;  . INCISION AND DRAINAGE PERIRECTAL ABSCESS Left 05/18/2019   Procedure: IRRIGATION AND DEBRIDEMENT OF PANNIS ABSCESS, POSSIBLE DEBRIDEMENT OF BUTTOCK WOUND;  Surgeon: Donnie Mesa, MD;  Location: [facility];  Service: General;  Laterality: Left;  . IRRIGATION AND DEBRIDEMENT BUTTOCKS Left 05/17/2019   Procedure: IRRIGATION AND DEBRIDEMENT BUTTOCKS;  Surgeon: Donnie Mesa, MD;  Location: [facility];  Service: General;  Laterality: Left;  . KNEE ARTHROSCOPY Right 07/17/2010  . LEFT HEART CATHETERIZATION WITH CORONARY ANGIOGRAM N/A 07/27/2012   Procedure: LEFT HEART CATHETERIZATION WITH CORONARY ANGIOGRAM;  Surgeon: Sherren Mocha, MD;  Location: [facility] CATH LAB;  Service: Cardiovascular;  Laterality: N/A;  . MASS EXCISION N/A 06/29/2013   Procedure:  WIDE LOCAL EXCISION OF POSTERIOR NECK ABSCESS;  Surgeon: Ralene Ok, MD;  Location: [facility];  Service: General;  Laterality: N/A;   . REPAIR KNEE LIGAMENT Left    "fixed ligaments and chipped patella"  . right transmetatarsal amputation        OB History    Gravida  2   Para  1   Term      Preterm      AB  1   Living  1     SAB  1   IAB      Ectopic      Multiple      Live Births              Family History  Problem Relation Age of Onset  . Diabetes Mother   . Hyperlipidemia Mother   . Depression Mother   . GER disease Mother   . Allergic rhinitis Mother   . Restless legs syndrome Mother   . Heart attack Paternal Uncle   . Heart disease Paternal Grandmother   . Heart attack Paternal Grandmother   . Heart attack Paternal Grandfather   . Heart disease Paternal Grandfather   . Heart attack  Father   . Migraines Sister   . Cancer Maternal Grandmother        COLON  . Hypertension Maternal Grandmother   . Hyperlipidemia Maternal Grandmother   . Diabetes Maternal Grandmother   . Other Maternal Grandfather        GUN SHOT  . Anxiety disorder Sister   . Asthma Child     Social History   Tobacco Use  . Smoking status: Former Smoker    Packs/day: 0.25    Years: 15.00    Pack years: 3.75    Types: Cigarettes    Quit date: 12/06/2005    Years since quitting: 15.4  . Smokeless tobacco: Never Used  . Tobacco comment: smokes for a couple of months  Vaping Use  . Vaping Use: Never used  Substance Use Topics  . Alcohol use: No    Alcohol/week: 0.0 standard drinks  . Drug use: Not Currently    Comment: OD attempts on home meds      Home Medications Prior to Admission medications   Medication Sig Start Date End Date Taking? Authorizing Provider  Accu-Chek FastClix Lancets MISC USE DAILY AS INSTRUCTED 03/26/21   Martyn Malay, MD  Accu-Chek Softclix Lancets lancets Use to check blood glucose four times daily 04/17/20   Martyn Malay, MD  ascorbic acid (VITAMIN C) 250 MG tablet Take 1 tablet (250 mg total) by mouth daily. 04/19/21   Florencia Reasons, MD  aspirin EC 81 MG tablet Take 1  tablet (81 mg total) by mouth daily. Swallow whole. 11/19/20   Sherren Mocha, MD  atorvastatin (LIPITOR) 80 MG tablet Take 1 tablet (80 mg total) by mouth daily. 02/11/21   [name], MD  budesonide-formoterol [facility]) 160-4.5 MCG/ACT inhaler Inhale 2 puffs into the lungs 2 (two) times daily. 05/09/20   [name], MD  Cholecalciferol 1000 units tablet Take 1 tablet (1,000 Units total) by mouth daily. 04/05/17   [name], MD  Continuous Blood Gluc Sensor (DEXCOM G6 SENSOR) MISC Inject 1 applicator into the skin as directed. Change sensor every 10 days. 10/16/20   Leavy Cella, RPH-CPP  Continuous Blood Gluc Transmit (DEXCOM G6 TRANSMITTER) MISC Inject 1 Device into the skin as directed. Reuse 8 times with sensor changes. 10/16/20   Leavy Cella, RPH-CPP  Dulaglutide (TRULICITY) 1.5 0000000 SOPN Inject 1.5 mg into the skin once a week. 03/25/21   [name], MD  Ferrous Sulfate (IRON) 325 (65 Fe) MG TABS Take 1 tablet (325 mg total) by mouth every other day. 03/31/21   [name], MD  gabapentin (NEURONTIN) 600 MG tablet TAKE 2 TABLETS BY MOUTH 3 TIMES DAILY 03/23/21   [name], MD  HYDROcodone-acetaminophen [facility]) 10-325 MG tablet Take 1 tablet by mouth every 8 (eight) hours as needed for moderate pain. 03/31/21   [name], MD  HYDROcodone-acetaminophen (NORCO) 10-325 MG tablet Take 1 tablet by mouth every 8 (eight) hours as needed for moderate pain. 03/31/21   [name], MD  HYDROcodone-acetaminophen (NORCO) 10-325 MG tablet Take 1 tablet by mouth every 8 (eight) hours as needed for moderate pain. 03/31/21   [name], MD  insulin aspart (NOVOLOG) 100 UNIT/ML FlexPen Inject 40 Units into the skin 3 (three) times daily with meals. Patient taking differently: Inject 45 Units into the skin 3 (three) times daily with meals. 02/10/21   [name], MD  insulin degludec (TRESIBA FLEXTOUCH) 200 UNIT/ML  FlexTouch Pen  Inject 110 Units into the skin 2 (two) times daily. Patient taking differently: Inject 90 Units into the skin 2 (two) times daily. 03/25/21   [name], MD  Insulin Pen Needle (SURE COMFORT PEN NEEDLES) 32G X 4 MM MISC Use to inject insulin as indicated 04/17/20   Martyn Malay, MD  metoprolol succinate (TOPROL-XL) 25 MG 24 hr tablet Take 25 mg by mouth daily. 10/23/20   [provider]  Misc. Devices (TRANSFER BENCH) MISC Use daily 10/29/20   [name], MD  omeprazole (PRILOSEC) 40 MG capsule Take 1 capsule (40 mg total) by mouth daily. 11/14/20   Martyn Ehrich, NP  potassium chloride SA (KLOR-CON M20) 20 MEQ tablet Take 1 tablet (20 mEq total) by mouth daily. 09/09/20   Richardson Dopp T, PA-C  rOPINIRole (REQUIP) 1 MG tablet Take 1 tablet (1 mg total) by mouth at bedtime. 08/12/20   [name], MD  Tiotropium Bromide Monohydrate (SPIRIVA RESPIMAT) 1.25 MCG/ACT AERS Inhale 2 puffs into the lungs daily. 11/14/20   Martyn Ehrich, NP  torsemide (DEMADEX) 20 MG tablet TAKE 4 TABLETS (80 MG) BY MOUTH ONCE A DAY 02/27/21   Lind Covert, MD  zinc sulfate 220 (50 Zn) MG capsule Take 1 capsule (220 mg total) by mouth daily for 14 days. 04/19/21 05/03/21  Florencia Reasons, MD    Allergies    Cefepime, Kiwi extract, Morphine and related, Nalbuphine, Trental [pentoxifylline], Morphine, and Nubain [nalbuphine hcl]  Review of Systems   Review of Systems  Constitutional: Negative for chills and fever.  HENT: Negative for ear pain and sore throat.   Eyes: Negative for pain and visual disturbance.  Respiratory: Positive for cough and shortness of breath.   Cardiovascular: Positive for chest pain. Negative for palpitations.  Gastrointestinal: Negative for abdominal pain and vomiting.  Genitourinary: Negative for dysuria and hematuria.  Musculoskeletal: Negative for arthralgias and back pain.  Skin: Negative for color change and rash.  Neurological:  Negative for seizures and syncope.  Psychiatric/Behavioral: Positive for sleep disturbance.  All other systems reviewed and are negative.   Physical Exam Updated Vital Signs BP 117/70   Pulse 95   Temp 98.2 F (36.8 C) (Oral)   Resp 20   Ht 5\' 4"  (1.626 m)   Wt (!) 167.7 kg   SpO2 96%   BMI 63.46 kg/m   Physical Exam Vitals and nursing note reviewed.  Constitutional:      General: She is not in acute distress.    Appearance: She is well-developed.  HENT:     Head: Normocephalic and atraumatic.  Eyes:     Extraocular Movements: Extraocular movements intact.     Conjunctiva/sclera: Conjunctivae normal.  Cardiovascular:     Rate and Rhythm: Normal rate and regular rhythm.     Heart sounds: Normal heart sounds. No murmur heard.   Pulmonary:     Effort: Pulmonary effort is normal. No respiratory distress.     Breath sounds: Normal breath sounds.  Chest:     Chest wall: Tenderness (worsens pain) present.  Abdominal:     Palpations: Abdomen is soft.     Tenderness: There is no abdominal tenderness.  Musculoskeletal:     Cervical back: Neck supple.  Skin:    General: Skin is warm and dry.     Capillary Refill: Capillary refill takes less than 2 seconds.  Neurological:     Mental Status: She is alert and oriented to person, place, and time.  Psychiatric:        Mood and Affect: Mood normal.        Behavior: Behavior normal.     ED Results / Procedures / Treatments   Labs (all labs ordered are listed, but only abnormal results are displayed) Labs Reviewed  CBC WITH DIFFERENTIAL/PLATELET - Abnormal; Notable for the following components:      Result Value   Hemoglobin 11.8 (*)    Platelets 416 (*)    Abs Immature Granulocytes 0.10 (*)    All other components within normal limits  COMPREHENSIVE METABOLIC PANEL - Abnormal; Notable for the following components:   Chloride 95 (*)    Glucose, Bld 278 (*)    BUN 32 (*)    Albumin 2.7 (*)    All other components  within normal limits  TROPONIN I (HIGH SENSITIVITY) - Abnormal; Notable for the following components:   Troponin I (High Sensitivity) 22 (*)    All other components within normal limits  TROPONIN I (HIGH SENSITIVITY) - Abnormal; Notable for the following components:   Troponin I (High Sensitivity) 19 (*)    All other components within normal limits  BRAIN NATRIURETIC PEPTIDE    EKG EKG Interpretation  Date/Time:  Monday Apr 27 2021 05:43:20 EDT Ventricular Rate:  99 PR Interval:  158 QRS Duration: 92 QT Interval:  362 QTC Calculation: 464 R Axis:   122 Text Interpretation: Normal sinus rhythm Right axis deviation Low voltage QRS Cannot rule out Anterior infarct , age undetermined Abnormal ECG when compared to prior, similar appearance. No STEMI Confirmed by Antony Blackbird [phone]) on 04/27/2021 7:44:07 AM   Radiology DG Chest 2 View  Result Date: 04/27/2021 CLINICAL DATA:  45 year old female with shortness of breath and chest pain. EXAM: CHEST - 2 VIEW COMPARISON:  Portable chest 04/14/2021 and earlier. FINDINGS: Upright AP and lateral views today. Chronically low lung volumes. Stable mild cardiomegaly. Other mediastinal contours are within normal limits. Visualized tracheal air column is within normal limits. No pneumothorax, pulmonary edema, pleural effusion or confluent pulmonary opacity. Diffuse crowding of lung markings, atelectasis. No acute osseous abnormality identified. Paucity of bowel gas in the upper abdomen. IMPRESSION: Chronic mild cardiomegaly and low lung volumes with atelectasis. No other acute cardiopulmonary abnormality. Electronically Signed   By: Genevie Ann M.D.   On: 04/27/2021 07:06    Procedures Procedures   Medications Ordered in ED Medications - No data to display  ED Course  I have reviewed the triage vital signs and the nursing notes.  Pertinent labs & imaging results that were available during my care of the patient were reviewed by me and considered in  my medical decision making (see chart for details).    MDM Rules/Calculators/A&P                          45 year old female presents emergency department for chest pain.  Upon arrival, she is mildly hypoxic on room air and improves on her 2 L nasal cannula.  She is not in acute distress.  Exam shows reassuring cardiovascular exam.  Pain is reproducible upon palpation to the chest wall.  It is soft and nontender.  Presentation could be secondary to chest wall tenderness from using BiPAP at night.  She does report a pretty strong connection between starting BiPAP and having this pain.  Pain is reproducible, which also points possibly it being musculoskeletal in nature.  Due to patient's significant comorbidities, will obtain labs, EKG,  troponin, and chest  X-ray.   Differential includes: ACS, COPD exacerbation, CHF exacerbation, musculoskeletal chest wall pain, PE, pericarditis, pneumothorax  Chest x-ray shows no acute findings, not consistent with pneumothorax.  EKG with normal sinus rhythm, no acute ischemic changes.  Troponin from 22 to 19.  BNP not elevated.  CBC and CMP not significantly changed from patient's baseline.  We did talk about the possibility of a pulmonary embolism.  Discussed that her pain does not significantly consistent with PE, additionally it is reproducible to palpation.  She does not feel significantly more short of breath than normal.  We talked about the risk and benefits of getting a CT PE study, she does not want to proceed with CT at this time.  I discussed findings with patient.  She is wearing 2 L nasal cannula here satting 95%.  We had a shared decision-making discussion regarding admission versus discharge with close follow-up.  We discussed the risk and benefits of each option.  She states she would like to go home and already has close PCP and pulmonology follow-up.  Additionally, encouraged her to call them today to let them know that she is having these  difficulties.  She states she has nasal cannula oxygen at home and can wear it.  Also has an oxygen monitor and will continue monitoring.  She states she currently feels better.  We discussed return precautions and recommendations for symptomatic management.  Patient discharged in stable condition.   Final Clinical Impression(s) / ED Diagnoses Final diagnoses:  Other chest pain    Rx / DC Orders ED Discharge Orders    None       Suzan Nailer, DO 04/27/21 [place], Gwenyth Allegra, MD 04/28/21 1517

## 2021-04-27 NOTE — ED Triage Notes (Signed)
Brought in by [facility] from home - c/o chest pain. Pt woke up at 0430 with sharp pressure in chest, non radiating.   324mg  aspirin. 1 nitro SL. Pain 8/10.  g18 left ac.

## 2021-04-28 ENCOUNTER — Telehealth: Payer: Self-pay

## 2021-04-28 NOTE — Telephone Encounter (Signed)
Transition Care Management Unsuccessful Follow-up Telephone Call  Date of discharge and from where: 04/27/2021 from [facility]  Attempts:  1st Attempt  Reason for unsuccessful TCM follow-up call:  Left voice message

## 2021-04-29 NOTE — Telephone Encounter (Signed)
Transition Care Management Unsuccessful Follow-up Telephone Call  Date of discharge and from where:  04/27/2021 from [facility]  Attempts:  2nd Attempt  Reason for unsuccessful TCM follow-up call:  Unable to reach patient

## 2021-04-30 ENCOUNTER — Other Ambulatory Visit: Payer: Self-pay | Admitting: Family Medicine

## 2021-04-30 DIAGNOSIS — E1151 Type 2 diabetes mellitus with diabetic peripheral angiopathy without gangrene: Secondary | ICD-10-CM

## 2021-04-30 DIAGNOSIS — IMO0002 Reserved for concepts with insufficient information to code with codable children: Secondary | ICD-10-CM

## 2021-04-30 NOTE — Telephone Encounter (Signed)
Transition Care Management Follow-up Telephone Call  Date of discharge and from where: 04/27/2021 - [facility]  How have you been since you were released from the hospital? "I am doing fine"  Any questions or concerns? No  Items Reviewed:  Did the pt receive and understand the discharge instructions provided? Yes   Medications obtained and verified? Yes   Other? No   Any new allergies since your discharge? No   Dietary orders reviewed? No  Do you have support at home? No    Functional Questionnaire: (I = Independent and D = Dependent) ADLs: I  Bathing/Dressing- I  Meal Prep- I  Eating- I  Maintaining continence- I  Transferring/Ambulation- I  Managing Meds- I  Follow up appointments reviewed:   PCP Hospital f/u appt confirmed? Yes  Scheduled to see PCP on 05/05/2021 @ 1030.  [facility] f/u appt confirmed? Yes  Scheduled to see Cardiology on 05/07/2021 @ 1000 and Pulmonology on 05/14/2021 @ 1115.  Are transportation arrangements needed? No   If their condition worsens, is the pt aware to call PCP or go to the Emergency Dept.? Yes  Was the patient provided with contact information for the PCP's office or ED? Yes  Was to pt encouraged to call back with questions or concerns? Yes

## 2021-05-01 ENCOUNTER — Telehealth: Payer: Self-pay

## 2021-05-01 NOTE — Telephone Encounter (Signed)
Attempted to reach patient by phone to discuss but no answer. Her recent urine cultures have not grown any particular bacteria. I would favor her being seen in person and giving a urine sample to confirm the cause of her symptoms before treating with additional antibiotics. As it is now the weekend, she will unfortunately need to go to urgent care.  CGM forms signed, given back to [facility] RN  [name], MD

## 2021-05-01 NOTE — Telephone Encounter (Signed)
Patient calls nurse line requesting antibiotic for UTI. Patient is reporting back pain and painful urination. Denies fever at this time. Patient was recently treated for UTI during hospitalization on 5/10, however, patient states that antibiotic did not get rid of infection.   We do not have any appointments until Tuesday. Please advise.   Patient also reports that she no longer has [facility] healthy blue. Patient reports that she now has regular [place] medicaid. Patient states that she needs new authorization on Dexcom CGM and sensors.   Will complete required paperwork and fax to [facility].   Talbot Grumbling, RN

## 2021-05-05 ENCOUNTER — Telehealth: Payer: Self-pay

## 2021-05-05 ENCOUNTER — Ambulatory Visit: Payer: Medicaid Other | Admitting: Licensed Clinical Social Worker

## 2021-05-05 ENCOUNTER — Emergency Department (HOSPITAL_COMMUNITY): Payer: Medicaid Other

## 2021-05-05 ENCOUNTER — Emergency Department (HOSPITAL_COMMUNITY)
Admission: EM | Admit: 2021-05-05 | Discharge: 2021-05-05 | Disposition: A | Discharge status: Home / Self Care | Payer: Medicaid Other | Attending: Emergency Medicine | Admitting: Emergency Medicine

## 2021-05-05 ENCOUNTER — Ambulatory Visit: Payer: Medicaid Other | Admitting: Family Medicine

## 2021-05-05 DIAGNOSIS — Z7189 Other specified counseling: Secondary | ICD-10-CM

## 2021-05-05 DIAGNOSIS — R0602 Shortness of breath: Secondary | ICD-10-CM | POA: Diagnosis not present

## 2021-05-05 DIAGNOSIS — Z7951 Long term (current) use of inhaled steroids: Secondary | ICD-10-CM | POA: Diagnosis not present

## 2021-05-05 DIAGNOSIS — Z8616 Personal history of COVID-19: Secondary | ICD-10-CM | POA: Insufficient documentation

## 2021-05-05 DIAGNOSIS — Z79899 Other long term (current) drug therapy: Secondary | ICD-10-CM | POA: Diagnosis not present

## 2021-05-05 DIAGNOSIS — E1151 Type 2 diabetes mellitus with diabetic peripheral angiopathy without gangrene: Secondary | ICD-10-CM | POA: Insufficient documentation

## 2021-05-05 DIAGNOSIS — J449 Chronic obstructive pulmonary disease, unspecified: Secondary | ICD-10-CM | POA: Insufficient documentation

## 2021-05-05 DIAGNOSIS — I10 Essential (primary) hypertension: Secondary | ICD-10-CM | POA: Diagnosis not present

## 2021-05-05 DIAGNOSIS — J45909 Unspecified asthma, uncomplicated: Secondary | ICD-10-CM | POA: Insufficient documentation

## 2021-05-05 DIAGNOSIS — R0789 Other chest pain: Secondary | ICD-10-CM | POA: Diagnosis not present

## 2021-05-05 DIAGNOSIS — R079 Chest pain, unspecified: Secondary | ICD-10-CM

## 2021-05-05 DIAGNOSIS — Z7982 Long term (current) use of aspirin: Secondary | ICD-10-CM | POA: Diagnosis not present

## 2021-05-05 DIAGNOSIS — Z794 Long term (current) use of insulin: Secondary | ICD-10-CM | POA: Diagnosis not present

## 2021-05-05 DIAGNOSIS — Z87891 Personal history of nicotine dependence: Secondary | ICD-10-CM | POA: Diagnosis not present

## 2021-05-05 LAB — BASIC METABOLIC PANEL
Anion gap: 12 (ref 5–15)
BUN: 37 mg/dL — ABNORMAL HIGH (ref 6–20)
CO2: 31 mmol/L (ref 22–32)
Calcium: 8.6 mg/dL — ABNORMAL LOW (ref 8.9–10.3)
Chloride: 92 mmol/L — ABNORMAL LOW (ref 98–111)
Creatinine, Ser: 1.17 mg/dL — ABNORMAL HIGH (ref 0.44–1.00)
GFR, Estimated: 59 mL/min — ABNORMAL LOW (ref >60)
Glucose, Bld: 303 mg/dL — ABNORMAL HIGH (ref 70–99)
Potassium: 4.4 mmol/L (ref 3.5–5.1)
Sodium: 135 mmol/L (ref 135–145)

## 2021-05-05 LAB — CBC
HCT: 37.5 % (ref 36.0–46.0)
Hemoglobin: 11.8 g/dL — ABNORMAL LOW (ref 12.0–15.0)
MCH: 30.7 pg (ref 26.0–34.0)
MCHC: 31.5 g/dL (ref 30.0–36.0)
MCV: 97.7 fL (ref 80.0–100.0)
Platelets: 262 10*3/uL (ref 150–400)
RBC: 3.84 MIL/uL — ABNORMAL LOW (ref 3.87–5.11)
RDW: 15.2 % (ref 11.5–15.5)
WBC: 9.5 10*3/uL (ref 4.0–10.5)
nRBC: 0.2 % (ref 0.0–0.2)

## 2021-05-05 LAB — TROPONIN I (HIGH SENSITIVITY)
Troponin I (High Sensitivity): 12 ng/L (ref <18)
Troponin I (High Sensitivity): 14 ng/L (ref <18)

## 2021-05-05 LAB — I-STAT BETA HCG BLOOD, ED (MC, WL, AP ONLY): I-stat hCG, quantitative: <5 m[IU]/mL (ref <5)

## 2021-05-05 LAB — BRAIN NATRIURETIC PEPTIDE: B Natriuretic Peptide: 16.7 pg/mL (ref 0.0–100.0)

## 2021-05-05 IMAGING — DX DG CHEST 1V PORT
1 series · 1 of 1 positions shown · non-contrast
Comparison: None.

CLINICAL DATA: Chest pain.

EXAM:
PORTABLE CHEST 1 VIEW

[chest ap]
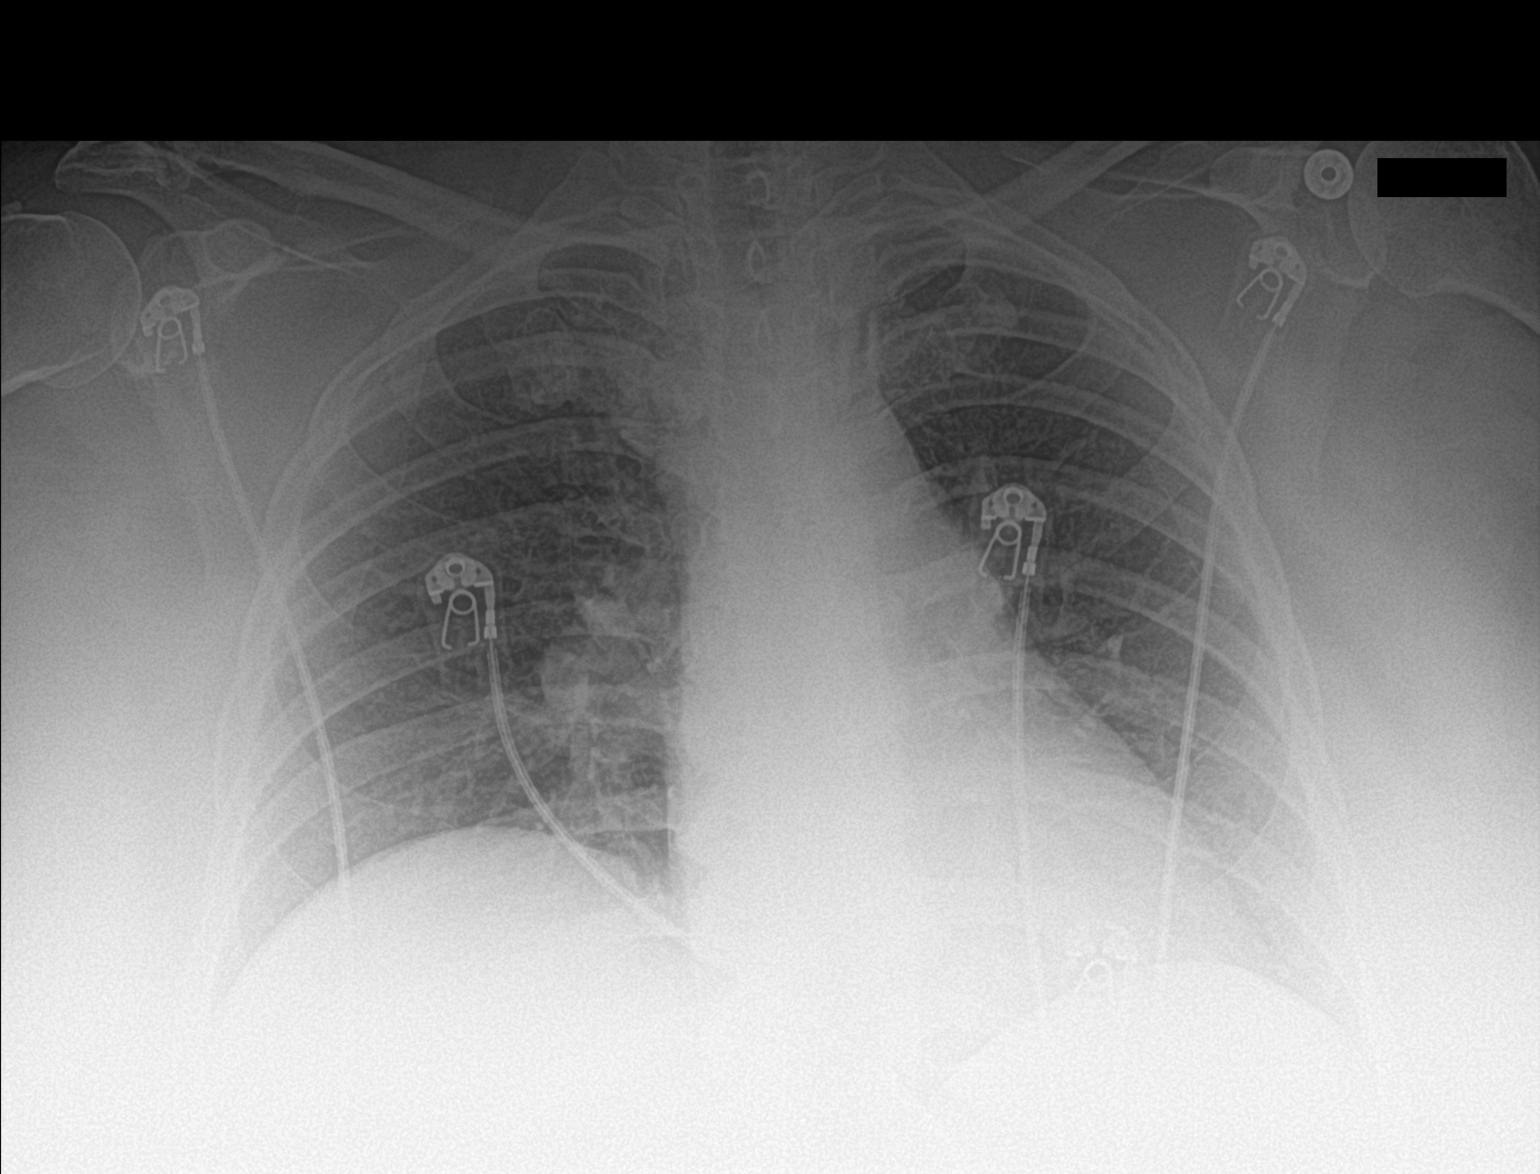

[1 of 1 positions shown; findings below may reference images not displayed]

FINDINGS: The heart size and mediastinal contours are within normal limits.
Both lungs are clear. No pneumothorax or pleural effusion is noted.
The visualized skeletal structures are unremarkable.
IMPRESSION: No active disease.

## 2021-05-05 MED ORDER — KETOROLAC TROMETHAMINE 15 MG/ML IJ SOLN
15.0000 mg | Freq: Once | INTRAMUSCULAR | Status: DC
  Filled 2021-05-05: qty 1

## 2021-05-05 MED ORDER — IBUPROFEN 800 MG PO TABS
800.0000 mg | ORAL_TABLET | Freq: Once | ORAL | Status: AC
  Administered 2021-05-05: 800 mg via ORAL
  Filled 2021-05-05: qty 1

## 2021-05-05 MED ORDER — ACETAMINOPHEN 500 MG PO TABS
1000.0000 mg | ORAL_TABLET | Freq: Once | ORAL | Status: AC
  Administered 2021-05-05: 1000 mg via ORAL
  Filled 2021-05-05: qty 2

## 2021-05-05 NOTE — Discharge Instructions (Signed)
If you develop recurrent, continued, or worsening chest pain, shortness of breath, fever, vomiting, abdominal or back pain, or any other new/concerning symptoms then return to the ER for evaluation.  

## 2021-05-05 NOTE — Telephone Encounter (Signed)
Completed PA info in [place] for [facility] [address]. Status pending. Will recheck status in 24 hours.

## 2021-05-05 NOTE — ED Triage Notes (Signed)
BIB [facility] after pt called to report increase c/p this AM. Pt reports centralized c/p 9/10. Per EMS pt had Covid 1 month ago and c/p has steadily been getting worse.     Hx CHF

## 2021-05-05 NOTE — ED Provider Notes (Signed)
[name] Provider Note   CSN: [id number] Arrival date & time: 05/05/21  3762     History Chief Complaint  Patient presents with  . Chest Pain    [name] is a 45 y.o. female.  HPI 45 year old female presents with chest pain and shortness of breath.  Woke her up from sleep at 5:30 AM.  Feels like a pressure. Similar to when she was here on 5/23.  She has had this a couple times since having COVID on 5/10.  Minimal cough.  No fevers or abdominal pain.  It is better to sit up for her dyspnea.  Certain movements like rolling over in bed or sitting up made the pain worse.  Pain is a 9.  She was given by EMS and take anything for pain.  She wears oxygen as needed.  Past Medical History:  Diagnosis Date  . Acute osteomyelitis of ankle or foot, left ([place]) 01/31/2018  . Alveolar hypoventilation   . Anemia    not on iron pill  . Asthma   . Bipolar 2 disorder ([place])   . Carpal tunnel syndrome on right    recurrent  . Cellulitis 08/2010-08/2011  . Chronic pain   . COPD (chronic obstructive pulmonary disease) ([name])    Symbicort daily and Proventil as needed  . Costochondritis   . Diabetes mellitus type II, uncontrolled ([place]) 2000   Type 2, Uncontrolled.Takes Lantus daily.Fasting blood sugar runs 150  . Drug-seeking behavior   . GERD (gastroesophageal reflux disease)    takes Pantoprazole and Zantac daily  . HLD (hyperlipidemia)    takes Atorvastatin daily  . Hypertension    takes Lisinopril and Coreg daily  . Morbid obesity ([place])   . Nocturia   . OSA on CPAP   . Peripheral neuropathy    takes Gabapentin daily  . Pneumonia    "walking" several yrs ago and as a baby (12/05/2018)  . Rectal fissure   . Restless leg   . SVT (supraventricular tachycardia) ([place])   . Syncope 02/25/2016  . Urinary frequency   . Urinary incontinence 10/23/2020  . Varicose veins    Right medial thigh and Left leg     Patient Active Problem List   Diagnosis  Date Noted  . Morbid obesity with BMI of 60.0-69.9, adult ([place]) 04/15/2021  . Hypoxia 04/15/2021  . UTI (urinary tract infection) 04/15/2021  . Chronic pain 04/15/2021  . Pneumonia due to COVID-19 virus 04/14/2021  . Urinary incontinence 10/23/2020  . Elevated troponin   . Acute respiratory failure with hypoxia and hypercarbia ([place]) 06/15/2020  . Chronic respiratory failure with hypoxia ([place]) 06/14/2020  . Blister of foot 04/21/2020  . Exposure to mold 04/21/2020  . Constipation 04/03/2020  . Left shoulder pain 06/23/2019  . Hyperglycemia due to diabetes mellitus ([place])   . 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[place]) 04/11/2019  . Allergic rhinitis 03/06/2018  . Class 3 severe obesity due to excess calories with serious comorbidity and body mass index (BMI) of 50.0 to 59.9 in adult [facility])   . Decreased pedal pulses 06/10/2017  . Unilateral primary osteoarthritis, right knee 10/22/2016  . Primary osteoarthritis of first carpometacarpal joint of left hand 07/30/2016  . Diabetic neuropathy ([place]) 07/14/2016  . Nausea 03/17/2016  . De Quervain's tenosynovitis, bilateral 11/01/2015  . Vitamin D deficiency 09/05/2015  . Recurrent candidiasis of vagina 09/05/2015  . Varicose veins of leg with complications 83/15/1761  . Restless leg syndrome 10/17/2014  . Chronic sinusitis  07/18/2014  . Encounter for chronic pain management 06/30/2013  . HLD (hyperlipidemia) 11/19/2012  . Angina pectoris ([place]) 06/27/2012  . Abscess of skin and subcutaneous tissue 11/04/2011  . Right carpal tunnel syndrome 09/01/2011  . Bilateral knee pain 09/01/2011  . DM (diabetes mellitus) type II uncontrolled, periph vascular disorder ([place]) 05/22/2008  . Morbid obesity ([place]) 05/22/2008  . Obesity hypoventilation syndrome ([place]) 05/22/2008  . Mood disorder ([place]) 05/22/2008  . Obstructive sleep apnea 05/22/2008  . Hypertension 05/22/2008  . Asthma 05/22/2008  . GERD 05/22/2008    Past Surgical History:  Procedure Laterality  Date  . AMPUTATION Left 02/01/2018   Procedure: LEFT FOURTH AND 5TH TOE RAY AMPUTATION;  Surgeon: [name], MD;  Location: [place];  Service: Orthopedics;  Laterality: Left;  . AMPUTATION Left 03/03/2018   Procedure: LEFT BELOW KNEE AMPUTATION;  Surgeon: [name], MD;  Location: [place];  Service: Orthopedics;  Laterality: Left;  . CARPAL TUNNEL RELEASE Bilateral   . CESAREAN SECTION  2007  . CORONARY ANGIOGRAPHY N/A 11/26/2020   Procedure: CORONARY ANGIOGRAPHY;  Surgeon: Sherren Mocha, MD;  Location: [place] [facility];  Service: Cardiovascular;  Laterality: N/A;  . INCISION AND DRAINAGE PERIRECTAL ABSCESS Left 05/18/2019   Procedure: IRRIGATION AND DEBRIDEMENT OF PANNIS ABSCESS, POSSIBLE DEBRIDEMENT OF BUTTOCK WOUND;  Surgeon: Donnie Mesa, MD;  Location: [place];  Service: General;  Laterality: Left;  . IRRIGATION AND DEBRIDEMENT BUTTOCKS Left 05/17/2019   Procedure: IRRIGATION AND DEBRIDEMENT BUTTOCKS;  Surgeon: Donnie Mesa, MD;  Location: [place];  Service: General;  Laterality: Left;  . KNEE ARTHROSCOPY Right 07/17/2010  . LEFT HEART CATHETERIZATION WITH CORONARY ANGIOGRAM N/A 07/27/2012   Procedure: LEFT HEART CATHETERIZATION WITH CORONARY ANGIOGRAM;  Surgeon: Sherren Mocha, MD;  [facility];  Service: Cardiovascular;  Laterality: N/A;  . MASS EXCISION N/A 06/29/2013   Procedure:  WIDE LOCAL EXCISION OF POSTERIOR NECK ABSCESS;  Surgeon: Ralene Ok, MD;  Location: [place];  Service: General;  Laterality: N/A;  . REPAIR KNEE LIGAMENT Left    "fixed ligaments and chipped patella"  . right transmetatarsal amputation        OB History    Gravida  2   Para  1   Term      Preterm      AB  1   Living  1     SAB  1   IAB      Ectopic      Multiple      Live Births              Family History  Problem Relation Age of Onset  . Diabetes Mother   . Hyperlipidemia Mother   . Depression Mother   . GER disease Mother   . Allergic rhinitis Mother   .  Restless legs syndrome Mother   . Heart attack Paternal Uncle   . Heart disease Paternal Grandmother   . Heart attack Paternal Grandmother   . Heart attack Paternal Grandfather   . Heart disease Paternal Grandfather   . Heart attack Father   . Migraines Sister   . Cancer Maternal Grandmother        COLON  . Hypertension Maternal Grandmother   . Hyperlipidemia Maternal Grandmother   . Diabetes Maternal Grandmother   . Other Maternal Grandfather        GUN SHOT  . Anxiety disorder Sister   . Asthma Child     Social History   Tobacco  Use  . Smoking status: Former Smoker    Packs/day: 0.25    Years: 15.00    Pack years: 3.75    Types: Cigarettes    Quit date: 12/06/2005    Years since quitting: 15.4  . Smokeless tobacco: Never Used  . Tobacco comment: smokes for a couple of months  Vaping Use  . Vaping Use: Never used  Substance Use Topics  . Alcohol use: No    Alcohol/week: 0.0 standard drinks  . Drug use: Not Currently    Comment: OD attempts on home meds      Home Medications Prior to Admission medications   Medication Sig Start Date End Date Taking? Authorizing Provider  albuterol (VENTOLIN HFA) 108 (90 Base) MCG/ACT inhaler Inhale 1-2 puffs into the lungs every 6 (six) hours as needed for wheezing or shortness of breath.   Yes [provider]  amLODipine (NORVASC) 10 MG tablet Take 1 tablet by mouth daily. 12/28/18  Yes [provider]  ascorbic acid (VITAMIN C) 250 MG tablet Take 1 tablet (250 mg total) by mouth daily. 04/19/21  Yes Florencia Reasons, MD  aspirin EC 81 MG tablet Take 1 tablet (81 mg total) by mouth daily. Swallow whole. 11/19/20  Yes Sherren Mocha, MD  atorvastatin (LIPITOR) 80 MG tablet Take 1 tablet (80 mg total) by mouth daily. 02/11/21  Yes [name], MD  budesonide-formoterol [facility]) 160-4.5 MCG/ACT inhaler Inhale 2 puffs into the lungs 2 (two) times daily. 05/09/20  Yes [name], MD  Cholecalciferol 1000 units  tablet Take 1 tablet (1,000 Units total) by mouth daily. 04/05/17  Yes [name], MD  gabapentin (NEURONTIN) 600 MG tablet TAKE 2 TABLETS BY MOUTH 3 TIMES DAILY Patient taking differently: Take 1,200 mg by mouth 3 (three) times daily. 03/23/21  Yes [name], MD  HYDROcodone-acetaminophen (NORCO) 10-325 MG tablet Take 1 tablet by mouth every 8 (eight) hours as needed for moderate pain. Patient taking differently: Take 1 tablet by mouth in the morning, at noon, and at bedtime. 03/31/21  Yes [name], MD  insulin aspart (NOVOLOG) 100 UNIT/ML FlexPen Inject 40 Units into the skin 3 (three) times daily with meals. Patient taking differently: Inject 45 Units into the skin 3 (three) times daily with meals. 02/10/21  Yes [name], MD  insulin degludec (TRESIBA FLEXTOUCH) 200 UNIT/ML FlexTouch Pen Inject 110 Units into the skin 2 (two) times daily. Patient taking differently: Inject 90 Units into the skin 2 (two) times daily. 03/25/21  Yes [name], MD  metoprolol succinate (TOPROL-XL) 25 MG 24 hr tablet Take 25 mg by mouth daily. 10/23/20  Yes [provider]  omeprazole (PRILOSEC) 40 MG capsule Take 1 capsule (40 mg total) by mouth daily. 11/14/20  Yes Martyn Ehrich, NP  potassium chloride SA (KLOR-CON M20) 20 MEQ tablet Take 1 tablet (20 mEq total) by mouth daily. 09/09/20  Yes Weaver, Anvitha Hutmacher T, PA-C  rOPINIRole (REQUIP) 1 MG tablet Take 1 tablet (1 mg total) by mouth at bedtime. 08/12/20  Yes [name], MD  Tiotropium Bromide Monohydrate (SPIRIVA RESPIMAT) 1.25 MCG/ACT AERS Inhale 2 puffs into the lungs daily. 11/14/20  Yes Martyn Ehrich, NP  torsemide (DEMADEX) 20 MG tablet TAKE 4 TABLETS (80 MG) BY MOUTH ONCE A DAY Patient taking differently: Take 80 mg by mouth daily. 02/27/21  Yes Chambliss, Jeb Levering, MD  TRULICITY 1.5 WS/5.6CL SOPN inject 1.5mg  into THE SKIN ONCE A WEEK Patient taking  differently: Inject 1.5 mg as  directed once a week. 05/01/21  Yes [name], MD  Accu-Chek FastClix Lancets MISC USE DAILY AS INSTRUCTED 03/26/21   Martyn Malay, MD  Accu-Chek Softclix Lancets lancets Use to check blood glucose four times daily 04/17/20   Martyn Malay, MD  Continuous Blood Gluc Sensor (DEXCOM G6 SENSOR) MISC Inject 1 applicator into the skin as directed. Change sensor every 10 days. 10/16/20   Leavy Cella, RPH-CPP  Continuous Blood Gluc Transmit (DEXCOM G6 TRANSMITTER) MISC Inject 1 Device into the skin as directed. Reuse 8 times with sensor changes. 10/16/20   Leavy Cella, RPH-CPP  Ferrous Sulfate (IRON) 325 (65 Fe) MG TABS Take 1 tablet (325 mg total) by mouth every other day. 03/31/21   [name], MD  HYDROcodone-acetaminophen (NORCO) 10-325 MG tablet Take 1 tablet by mouth every 8 (eight) hours as needed for moderate pain. Patient not taking: Reported on 05/05/2021 03/31/21   [name], MD  HYDROcodone-acetaminophen [facility]) 10-325 MG tablet Take 1 tablet by mouth every 8 (eight) hours as needed for moderate pain. Patient not taking: Reported on 05/05/2021 03/31/21   [name], MD  Insulin Pen Needle (SURE COMFORT PEN NEEDLES) 32G X 4 MM MISC Use to inject insulin as indicated 04/17/20   Martyn Malay, MD  Misc. Devices (TRANSFER BENCH) MISC Use daily 10/29/20   [name], MD    Allergies    Cefepime, Kiwi extract, Morphine and related, Nalbuphine, Trental [pentoxifylline], Toradol [ketorolac tromethamine], Morphine, and Nubain [nalbuphine hcl]  Review of Systems   Review of Systems  Constitutional: Negative for fever.  Respiratory: Positive for shortness of breath.   Cardiovascular: Positive for chest pain. Negative for leg swelling.  Gastrointestinal: Negative for abdominal pain.  All other systems reviewed and are negative.   Physical Exam Updated Vital Signs BP 118/62   Pulse 90   Temp 97.8 F (36.6 C)   Resp 17   SpO2 94%    Physical Exam Vitals and nursing note reviewed.  Constitutional:      Appearance: She is well-developed. She is obese. She is not ill-appearing or diaphoretic.  HENT:     Head: Normocephalic and atraumatic.     Right Ear: External ear normal.     Left Ear: External ear normal.     Nose: Nose normal.  Eyes:     General:        Right eye: No discharge.        Left eye: No discharge.  Cardiovascular:     Rate and Rhythm: Normal rate and regular rhythm.     Heart sounds: Normal heart sounds.  Pulmonary:     Effort: Pulmonary effort is normal.     Breath sounds: Normal breath sounds. No wheezing or rales.  Chest:     Chest wall: Tenderness present.       Comments: TTP in mid chest. This reproduces her pain Abdominal:     Palpations: Abdomen is soft.     Tenderness: There is no abdominal tenderness.  Musculoskeletal:     Comments: Left BKA  Skin:    General: Skin is warm and dry.  Neurological:     Mental Status: She is alert.  Psychiatric:        Mood and Affect: Mood is not anxious.     ED Results / Procedures / Treatments   Labs (all labs ordered are listed, but only abnormal results are displayed)  Labs Reviewed  BASIC METABOLIC PANEL - Abnormal; Notable for the following components:      Result Value   Chloride 92 (*)    Glucose, Bld 303 (*)    BUN 37 (*)    Creatinine, Ser 1.17 (*)    Calcium 8.6 (*)    GFR, Estimated 59 (*)    All other components within normal limits  CBC - Abnormal; Notable for the following components:   RBC 3.84 (*)    Hemoglobin 11.8 (*)    All other components within normal limits  BRAIN NATRIURETIC PEPTIDE  I-STAT BETA HCG BLOOD, ED ([facility], [facility], [facility] ONLY)  TROPONIN I (HIGH SENSITIVITY)  TROPONIN I (HIGH SENSITIVITY)    EKG EKG Interpretation  Date/Time:  Tuesday May 05 2021 07:00:47 EDT Ventricular Rate:  92 PR Interval:  170 QRS Duration: 97 QT Interval:  385 QTC Calculation: 477 R Axis:   62 Text Interpretation: Sinus  rhythm Low voltage, extremity and precordial leads Consider anterior infarct T wave inversions AVL,I aredifferent compared to May 10 but similar compared to2021 Confirmed by Sherwood Gambler [phone]) on 05/05/2021 7:03:19 AM   Radiology DG Chest 2 View  Result Date: 05/05/2021 CLINICAL DATA:  45 year old female with history of chest pain. EXAM: CHEST - 2 VIEW COMPARISON:  Chest x-ray 04/27/2021. FINDINGS: Study is limited by patient's large body habitus and under penetration of the image. With this limitation in mind, lung volumes are low. No consolidative airspace disease. No pleural effusions. No pneumothorax. No pulmonary nodule or mass noted. Pulmonary vasculature and the cardiomediastinal silhouette are within normal limits. IMPRESSION: 1. Low lung volumes without radiographic evidence of acute cardiopulmonary disease. Electronically Signed   By: Vinnie Langton M.D.   On: 05/05/2021 07:40    Procedures Procedures   Medications Ordered in ED Medications  ibuprofen (ADVIL) tablet 800 mg (800 mg Oral Given 05/05/21 0830)  acetaminophen (TYLENOL) tablet 1,000 mg (1,000 mg Oral Given 05/05/21 0830)    ED Course  I have reviewed the triage vital signs and the nursing notes.  Pertinent labs & imaging results that were available during my care of the patient were reviewed by me and considered in my medical decision making (see chart for details).    MDM Rules/Calculators/A&P                          Patient's pain is probably chest wall in etiology.  Her labs are near baseline.  This includes chronic anemia and chronic mildly bumped creatinine.  Troponins are negative x2.  No obvious signs of CHF.  Her pain is reproducible.  At this point my suspicion of ACS, PE, dissection is pretty low.  No obvious infection.  I do not think she is having CHF.  She states she cannot take ibuprofen because of stomach problems so I will recommend she take Tylenol but I do not think narcotics are warranted,  despite her stating she is still in pain.  She will need to follow-up with her PCP. Final Clinical Impression(s) / ED Diagnoses Final diagnoses:  Nonspecific chest pain    Rx / DC Orders ED Discharge Orders    None       Sherwood Gambler, MD 05/05/21 1155

## 2021-05-05 NOTE — Telephone Encounter (Signed)
Completed PA info in [place] for Trulicity 1.5mg . Status pending. Will recheck status in 24 hours.

## 2021-05-05 NOTE — Patient Instructions (Addendum)
Visit Information  Goals Addressed            This Visit's Progress   . Begin and Stick with Counseling-Depression       Timeframe:  Long-Range Goal Priority:  High Start Date:  02/19/21                           Expected End Date:                        Patient Goals/Self-Care Activities:  . Call [facility] to establish care  . Call me if you need ongoing support . I will f/u with you in 30 days  Why is this important?    Beating depression may take some time.   If you don't feel better right away, don't give up on your treatment plan.      Patient verbalizes understanding of instructions provided today and agrees to view in [place].   LCSW will follow up in 30 days or less  Casimer Lanius, [place] / [place]   [phone] 4:11 PM

## 2021-05-05 NOTE — Chronic Care Management (AMB) (Signed)
Care Management   Clinical Social Work Note  05/05/2021 Name: [name] MRN: [id number] DOB: 05-11-1976  [name] is a 45 y.o. year old female who is a primary care patient of Belinda Mems [name], MD. The CCM team was consulted to assist the patient with chronic disease management and/or care coordination needs related to: Mental Health Counseling and Resources.   Engaged with patient by telephone for follow up visit in response to provider referral for social work chronic care management and care coordination services.   Consent to Services:  The patient was given information about Chronic Care Management services, agreed to services, and gave verbal consent prior to initiation of services.  Please see initial visit note for detailed documentation.   Patient agreed to services and consent obtained.   Assessment: Patient continues to experience difficulty with connecting with a therapist, partly due to managing chronic health related to  several recent hospital admissions. See Care Plan below for interventions and patient self-care actives. Recommendation: Patient may benefit from, and is in agreement to follow up with scheduling therapy appointment with agency discussed.  Follow up Plan: Patient would like continued follow-up.  CCM LCSW will follow up with patient in 30 days or less. Patient will call office if needed prior to next encounter.   : Review of patient past medical history, allergies, medications, and health status, including review of relevant consultants reports was performed today as part of a comprehensive evaluation and provision of chronic care management and care coordination services.     SDOH (Social Determinants of Health) assessments and interventions performed:    Advanced Directives Status: Not addressed in this encounter.  CCM Care Plan  Allergies  Allergen Reactions  . Cefepime Other (See Comments)    AKI, see records from [facility] hospitalization in January  2020.   Other reaction(s): Other (See Comments)  Note pt has tolerated Rocephin and Keflex  . Kiwi Extract Shortness Of Breath, Swelling and Anaphylaxis  . Morphine And Related Nausea And Vomiting  . Nalbuphine     Other reaction(s): Hallucinations, Other (See Comments) "FEELS LIKE SOMETHING CRAWLING ON ME" Reaction: Nervousness "FEELS LIKE SOMETHING CRAWLING ON ME" Reaction: Nervousness Reaction: Nervousness "FEELS LIKE SOMETHING CRAWLING ON ME"   . Trental [Pentoxifylline] Nausea And Vomiting  . Toradol [Ketorolac Tromethamine] Other (See Comments)    Feels like something is crawling on me  . Morphine Nausea And Vomiting  . Nubain [Nalbuphine Hcl] Other (See Comments)    "FEELS LIKE SOMETHING CRAWLING ON ME"    Outpatient Encounter Medications as of 05/05/2021  Medication Sig Note  . Accu-Chek FastClix Lancets MISC USE DAILY AS INSTRUCTED   . Accu-Chek Softclix Lancets lancets Use to check blood glucose four times daily   . albuterol (VENTOLIN HFA) 108 (90 Base) MCG/ACT inhaler Inhale 1-2 puffs into the lungs every 6 (six) hours as needed for wheezing or shortness of breath.   Marland Kitchen amLODipine (NORVASC) 10 MG tablet Take 1 tablet by mouth daily.   Marland Kitchen ascorbic acid (VITAMIN C) 250 MG tablet Take 1 tablet (250 mg total) by mouth daily.   Marland Kitchen aspirin EC 81 MG tablet Take 1 tablet (81 mg total) by mouth daily. Swallow whole.   Marland Kitchen atorvastatin (LIPITOR) 80 MG tablet Take 1 tablet (80 mg total) by mouth daily.   . budesonide-formoterol (SYMBICORT) 160-4.5 MCG/ACT inhaler Inhale 2 puffs into the lungs 2 (two) times daily.   . Cholecalciferol 1000 units tablet Take 1 tablet (1,000 Units total) by  mouth daily.   . Continuous Blood Gluc Sensor (DEXCOM G6 SENSOR) MISC Inject 1 applicator into the skin as directed. Change sensor every 10 days.   . Continuous Blood Gluc Transmit (DEXCOM G6 TRANSMITTER) MISC Inject 1 Device into the skin as directed. Reuse 8 times with sensor changes.   . Ferrous  Sulfate (IRON) 325 (65 Fe) MG TABS Take 1 tablet (325 mg total) by mouth every other day. 04/14/2021: Hasn't started.  . gabapentin (NEURONTIN) 600 MG tablet TAKE 2 TABLETS BY MOUTH 3 TIMES DAILY (Patient taking differently: Take 1,200 mg by mouth 3 (three) times daily.)   . HYDROcodone-acetaminophen (NORCO) 10-325 MG tablet Take 1 tablet by mouth every 8 (eight) hours as needed for moderate pain. (Patient taking differently: Take 1 tablet by mouth in the morning, at noon, and at bedtime.)   . HYDROcodone-acetaminophen (NORCO) 10-325 MG tablet Take 1 tablet by mouth every 8 (eight) hours as needed for moderate pain. (Patient not taking: Reported on 05/05/2021)   . HYDROcodone-acetaminophen (NORCO) 10-325 MG tablet Take 1 tablet by mouth every 8 (eight) hours as needed for moderate pain. (Patient not taking: Reported on 05/05/2021)   . insulin aspart (NOVOLOG) 100 UNIT/ML FlexPen Inject 40 Units into the skin 3 (three) times daily with meals. (Patient taking differently: Inject 45 Units into the skin 3 (three) times daily with meals.)   . insulin degludec (TRESIBA FLEXTOUCH) 200 UNIT/ML FlexTouch Pen Inject 110 Units into the skin 2 (two) times daily. (Patient taking differently: Inject 90 Units into the skin 2 (two) times daily.)   . Insulin Pen Needle (SURE COMFORT PEN NEEDLES) 32G X 4 MM MISC Use to inject insulin as indicated   . metoprolol succinate (TOPROL-XL) 25 MG 24 hr tablet Take 25 mg by mouth daily.   . Misc. Devices (TRANSFER BENCH) MISC Use daily   . omeprazole (PRILOSEC) 40 MG capsule Take 1 capsule (40 mg total) by mouth daily.   . potassium chloride SA (KLOR-CON M20) 20 MEQ tablet Take 1 tablet (20 mEq total) by mouth daily.   Marland Kitchen rOPINIRole (REQUIP) 1 MG tablet Take 1 tablet (1 mg total) by mouth at bedtime.   . Tiotropium Bromide Monohydrate (SPIRIVA RESPIMAT) 1.25 MCG/ACT AERS Inhale 2 puffs into the lungs daily.   Marland Kitchen torsemide (DEMADEX) 20 MG tablet TAKE 4 TABLETS (80 MG) BY MOUTH ONCE A  DAY (Patient taking differently: Take 80 mg by mouth daily.)   . TRULICITY 1.5 JK/0.9FG SOPN inject 1.5mg  into THE SKIN ONCE A WEEK (Patient taking differently: Inject 1.5 mg as directed once a week.)    No facility-administered encounter medications on file as of 05/05/2021.    Patient Active Problem List   Diagnosis Date Noted  . Morbid obesity with BMI of 60.0-69.9, adult ([place]) 04/15/2021  . Hypoxia 04/15/2021  . UTI (urinary tract infection) 04/15/2021  . Chronic pain 04/15/2021  . Pneumonia due to COVID-19 virus 04/14/2021  . Urinary incontinence 10/23/2020  . Elevated troponin   . Acute respiratory failure with hypoxia and hypercarbia ([place]) 06/15/2020  . Chronic respiratory failure with hypoxia ([place]) 06/14/2020  . Blister of foot 04/21/2020  . Exposure to mold 04/21/2020  . Constipation 04/03/2020  . Left shoulder pain 06/23/2019  . Hyperglycemia due to diabetes mellitus ([place])   . 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[place]) 04/11/2019  . Allergic rhinitis 03/06/2018  . Class 3 severe obesity due to excess calories with serious comorbidity and body mass index (BMI) of 50.0 to 59.9 in adult [facility])   .  Decreased pedal pulses 06/10/2017  . Unilateral primary osteoarthritis, right knee 10/22/2016  . Primary osteoarthritis of first carpometacarpal joint of left hand 07/30/2016  . Diabetic neuropathy ([place]) 07/14/2016  . Nausea 03/17/2016  . De Quervain's tenosynovitis, bilateral 11/01/2015  . Vitamin D deficiency 09/05/2015  . Recurrent candidiasis of vagina 09/05/2015  . Varicose veins of leg with complications 16/09/9603  . Restless leg syndrome 10/17/2014  . Chronic sinusitis 07/18/2014  . Encounter for chronic pain management 06/30/2013  . HLD (hyperlipidemia) 11/19/2012  . Angina pectoris ([place]) 06/27/2012  . Abscess of skin and subcutaneous tissue 11/04/2011  . Right carpal tunnel syndrome 09/01/2011  . Bilateral knee pain 09/01/2011  . DM (diabetes mellitus) type II uncontrolled,  periph vascular disorder ([place]) 05/22/2008  . Morbid obesity ([place]) 05/22/2008  . Obesity hypoventilation syndrome ([place]) 05/22/2008  . Mood disorder ([place]) 05/22/2008  . Obstructive sleep apnea 05/22/2008  . Hypertension 05/22/2008  . Asthma 05/22/2008  . GERD 05/22/2008    Conditions to be addressed/monitored: Depression;   Care Plan : [place] Social Work  Updates made by Maurine Cane, LCSW since 05/05/2021 12:00 AM  Problem: Symptoms of Depression Identification   Long-Range Goal: Manage symptoms of depression and start counseling   Start Date: 02/19/2021  Recent Progress: Not on track  Priority: High  Current barriers:   . Patient missed counseling appointment and has been in and out of the hospital  . [place] needs related to symptoms of depression  Currently unable to independently self manage needs related to mental health conditions. Leodis [place] knowledge of where and how to connect for counseling  Clinical Goal(s): Over the next 30 days, patient will work with SW to reduce or manage symptoms of depression and stress until connected for ongoing counseling.  Clinical Interventions:  . Assessed patient's needs, coping skills,support system and barriers to care . Patient has difficulty connecting with [facility] for counseling . She will contact [facility] [address]  [place], [place] [address]   [phone]  . Other interventions include: Solution-Focused Strategies and Emotional Supportive  . Inter-disciplinary care team collaboration (see longitudinal plan of care) Patient Goals/Self-Care Activities: Over the next 30 days . Call [facility] to establish care  . Call me if you need ongoing support . I will f/u with you in 30 days     Casimer Lanius, [place] / [place]   [phone] 4:10 PM

## 2021-05-06 ENCOUNTER — Telehealth: Payer: Self-pay | Admitting: *Deleted

## 2021-05-06 NOTE — Telephone Encounter (Signed)
Transition Care Management Follow-up Telephone Call  Date of discharge and from where: 05/05/2021 - [facility]  How have you been since you were released from the hospital? "I am fine"  Any questions or concerns? No  Items Reviewed:  Did the pt receive and understand the discharge instructions provided? Yes   Medications obtained and verified? Yes   Other? No   Any new allergies since your discharge? No   Dietary orders reviewed? No  Do you have support at home? No    Functional Questionnaire: (I = Independent and D = Dependent) ADLs: I  Bathing/Dressing- I  Meal Prep- I  Eating- I  Maintaining continence- I  Transferring/Ambulation- I  Managing Meds- I  Follow up appointments reviewed:   PCP Hospital f/u appt confirmed? Yes  Scheduled to see PCP on 05/11/2021 @ 0845.  [facility] f/u appt confirmed? Yes  Scheduled to see Cardiology on 05/07/2021 @ 1000.  Are transportation arrangements needed? No   If their condition worsens, is the pt aware to call PCP or go to the Emergency Dept.? Yes  Was the patient provided with contact information for the PCP's office or ED? Yes  Was to pt encouraged to call back with questions or concerns? Yes

## 2021-05-06 NOTE — Telephone Encounter (Signed)
Med approved for 05/05/2021-05/05/2022. The pharmacy has been updated.

## 2021-05-06 NOTE — Progress Notes (Deleted)
Cardiology Office Note:    Date:  05/06/2021   ID:  [name], DOB November 23, 1976, MRN [id number]  PCP:  [name], MD   [facility] Providers Cardiologist:  Sherren Mocha, MD Sleep Medicine:  Fransico Him, MD     Referring MD: [name], MD   No chief complaint on file.   History of Present Illness:    [name] is a 45 y.o. female with a hx of ***  Past Medical History:  Diagnosis Date  . Acute osteomyelitis of ankle or foot, left ([place]) 01/31/2018  . Alveolar hypoventilation   . Anemia    not on iron pill  . Asthma   . Bipolar 2 disorder ([place])   . Carpal tunnel syndrome on right    recurrent  . Cellulitis 08/2010-08/2011  . Chronic pain   . COPD (chronic obstructive pulmonary disease) ([name])    Symbicort daily and Proventil as needed  . Costochondritis   . Diabetes mellitus type II, uncontrolled ([place]) 2000   Type 2, Uncontrolled.Takes Lantus daily.Fasting blood sugar runs 150  . Drug-seeking behavior   . GERD (gastroesophageal reflux disease)    takes Pantoprazole and Zantac daily  . HLD (hyperlipidemia)    takes Atorvastatin daily  . Hypertension    takes Lisinopril and Coreg daily  . Morbid obesity ([place])   . Nocturia   . OSA on CPAP   . Peripheral neuropathy    takes Gabapentin daily  . Pneumonia    "walking" several yrs ago and as a baby (12/05/2018)  . Rectal fissure   . Restless leg   . SVT (supraventricular tachycardia) ([place])   . Syncope 02/25/2016  . Urinary frequency   . Urinary incontinence 10/23/2020  . Varicose veins    Right medial thigh and Left leg     Past Surgical History:  Procedure Laterality Date  . AMPUTATION Left 02/01/2018   Procedure: LEFT FOURTH AND 5TH TOE RAY AMPUTATION;  Surgeon: [name], MD;  Location: [facility];  Service: Orthopedics;  Laterality: Left;  . AMPUTATION Left 03/03/2018   Procedure: LEFT BELOW KNEE AMPUTATION;  Surgeon: [name], MD;  Location: [facility];  Service: Orthopedics;   Laterality: Left;  . CARPAL TUNNEL RELEASE Bilateral   . CESAREAN SECTION  2007  . CORONARY ANGIOGRAPHY N/A 11/26/2020   Procedure: CORONARY ANGIOGRAPHY;  Surgeon: Sherren Mocha, MD;  Location: [facility] CV [facility];  Service: Cardiovascular;  Laterality: N/A;  . INCISION AND DRAINAGE PERIRECTAL ABSCESS Left 05/18/2019   Procedure: IRRIGATION AND DEBRIDEMENT OF PANNIS ABSCESS, POSSIBLE DEBRIDEMENT OF BUTTOCK WOUND;  Surgeon: Donnie Mesa, MD;  Location: [facility];  Service: General;  Laterality: Left;  . IRRIGATION AND DEBRIDEMENT BUTTOCKS Left 05/17/2019   Procedure: IRRIGATION AND DEBRIDEMENT BUTTOCKS;  Surgeon: Donnie Mesa, MD;  Location: [facility];  Service: General;  Laterality: Left;  . KNEE ARTHROSCOPY Right 07/17/2010  . LEFT HEART CATHETERIZATION WITH CORONARY ANGIOGRAM N/A 07/27/2012   Procedure: LEFT HEART CATHETERIZATION WITH CORONARY ANGIOGRAM;  Surgeon: Sherren Mocha, MD;  Location: [facility] CATH LAB;  Service: Cardiovascular;  Laterality: N/A;  . MASS EXCISION N/A 06/29/2013   Procedure:  WIDE LOCAL EXCISION OF POSTERIOR NECK ABSCESS;  Surgeon: Ralene Ok, MD;  Location: [facility];  Service: General;  Laterality: N/A;  . REPAIR KNEE LIGAMENT Left    "fixed ligaments and chipped patella"  . right transmetatarsal amputation       Current Medications: No outpatient medications have been marked as taking for the  05/07/21 encounter (Appointment) with Sherren Mocha, MD.     Allergies:   Cefepime, Kiwi extract, Morphine and related, Nalbuphine, Trental [pentoxifylline], Toradol [ketorolac tromethamine], Morphine, and Nubain [nalbuphine hcl]   Social History   Socioeconomic History  . Marital status: Married    Spouse name: Not on file  . Number of children: Not on file  . Years of education: Not on file  . Highest education level: Not on file  Occupational History  . Not on file  Tobacco Use  . Smoking status: Former Smoker    Packs/day: 0.25    Years: 15.00    Pack years: 3.75     Types: Cigarettes    Quit date: 12/06/2005    Years since quitting: 15.4  . Smokeless tobacco: Never Used  . Tobacco comment: smokes for a couple of months  Vaping Use  . Vaping Use: Never used  Substance and Sexual Activity  . Alcohol use: No    Alcohol/week: 0.0 standard drinks  . Drug use: Not Currently    Comment: OD attempts on home meds    . Sexual activity: Yes    Partners: Male  Other Topics Concern  . Not on file  Social History Narrative   Lives in [place] with her fiance and 43 yr old dtr.   Social Determinants of Health   Financial Resource Strain: High Risk  . Difficulty of Paying Living Expenses: Hard  Food Insecurity: Food Insecurity Present  . Worried About Charity fundraiser in the Last Year: Often true  . Ran Out of Food in the Last Year: Often true  Transportation Needs: No Transportation Needs  . Lack of Transportation (Medical): No  . Lack of Transportation (Non-Medical): No  Physical Activity: Not on file  Stress: Stress Concern Present  . Feeling of Stress : Very much  Social Connections: Not on file     Family History: The patient's family history includes Allergic rhinitis in her mother; Anxiety disorder in her sister; Asthma in her child; Cancer in her maternal grandmother; Depression in her mother; Diabetes in her maternal grandmother and mother; GER disease in her mother; Heart attack in her father, paternal grandfather, paternal grandmother, and paternal uncle; Heart disease in her paternal grandfather and paternal grandmother; Hyperlipidemia in her maternal grandmother and mother; Hypertension in her maternal grandmother; Migraines in her sister; Other in her maternal grandfather; Restless legs syndrome in her mother.  ROS:   Please see the history of present illness.    All other systems reviewed and are negative.  EKGs/Labs/Other Studies Reviewed:    The following studies were reviewed today: Cardiac Cath 11/26/2020: Conclusion  1.  Mild  diffuse proximal LAD stenosis with no evidence of obstructive disease (20 to 30% diffuse stenosis) 2.  Left dominant circumflex with widely patent obtuse marginal branches and left PDA branch, no significant stenoses present 3.  Nondominant RCA with mild diffuse nonobstructive stenosis  Recommend: Ongoing efforts at diabetes control and risk reduction measures.  There is no obstructive CAD to account for the patient's chest discomfort.  Diagnostic Dominance: Left  Left Anterior Descending  There is mild diffuse disease throughout the vessel. The LAD is diffusely calcified without any tight stenoses.  Left Circumflex  The vessel exhibits minimal luminal irregularities.  First Obtuse Marginal Branch  The vessel exhibits minimal luminal irregularities.  Right Coronary Artery  The vessel exhibits minimal luminal irregularities.   Intervention   No interventions have been documented.  Coronary Diagrams   Diagnostic  Dominance: Left    Echo 02/17/2021: IMPRESSIONS    1. Limited diagnostic images due to suboptimal acoustic windows.  2. Left ventricular ejection fraction, by estimation, is 55 to 60%. The  left ventricle has normal function. Left ventricular endocardial border  not optimally defined to evaluate regional wall motion. There is mild left  ventricular hypertrophy. Left  ventricular diastolic function could not be evaluated.  3. Right ventricular systolic function is normal. The right ventricular  size is moderately enlarged.  4. The mitral valve is grossly normal. No evidence of mitral valve  regurgitation.  5. The aortic valve was not well visualized. Aortic valve regurgitation  is not visualized.   EKG:  EKG is *** ordered today.  The ekg ordered today demonstrates ***  Recent Labs: 06/17/2020: TSH 2.852 11/14/2020: Pro B Natriuretic peptide (BNP) 35.0 04/27/2021: ALT 23 05/05/2021: B Natriuretic Peptide 16.7; BUN 37; Creatinine, Ser 1.17; Hemoglobin 11.8;  Platelets 262; Potassium 4.4; Sodium 135  Recent Lipid Panel    Component Value Date/Time   CHOL 330 (H) 02/10/2021 1635   TRIG 282 (H) 04/14/2021 2145   HDL 37 (L) 02/10/2021 1635   CHOLHDL 8.9 (H) 02/10/2021 1635   CHOLHDL 6.3 (H) 01/11/2017 0909   VLDL 52 (H) 01/11/2017 0909   LDLCALC 192 (H) 02/10/2021 1635   LDLDIRECT 164.9 11/15/2014 1148     Risk Assessment/Calculations:       Physical Exam:    VS:  There were no vitals taken for this visit.    Wt Readings from Last 3 Encounters:  04/27/21 (!) 369 lb 11.4 oz (167.7 kg)  04/15/21 (!) 369 lb 11.4 oz (167.7 kg)  04/03/21 (!) 365 lb 12.8 oz (165.9 kg)     GEN: *** Well nourished, well developed in no acute distress HEENT: Normal NECK: No JVD; No carotid bruits LYMPHATICS: No lymphadenopathy CARDIAC: ***RRR, no murmurs, rubs, gallops RESPIRATORY:  Clear to auscultation without rales, wheezing or rhonchi  ABDOMEN: Soft, non-tender, non-distended MUSCULOSKELETAL:  No edema; No deformity  SKIN: Warm and dry NEUROLOGIC:  Alert and oriented x 3 PSYCHIATRIC:  Normal affect   ASSESSMENT:    No diagnosis found. PLAN:    In order of problems listed above:  1. ***   {Are you ordering a CV Procedure (e.g. stress test, cath, DCCV, TEE, etc)?   Press F2        :[id number]}    Medication Adjustments/Labs and Tests Ordered: Current medicines are reviewed at length with the patient today.  Concerns regarding medicines are outlined above.  No orders of the defined types were placed in this encounter.  No orders of the defined types were placed in this encounter.   There are no Patient Instructions on file for this visit.   Signed, Sherren Mocha, MD  05/06/2021 3:45 PM    [facility]

## 2021-05-06 NOTE — Telephone Encounter (Signed)
Medication approved and the pharmacy has been updated.

## 2021-05-07 ENCOUNTER — Ambulatory Visit: Payer: Medicaid Other | Admitting: Cardiovascular Disease

## 2021-05-11 ENCOUNTER — Ambulatory Visit (INDEPENDENT_AMBULATORY_CARE_PROVIDER_SITE_OTHER): Payer: Medicaid Other | Admitting: Family Medicine

## 2021-05-11 ENCOUNTER — Encounter: Payer: Self-pay | Admitting: Family Medicine

## 2021-05-11 ENCOUNTER — Other Ambulatory Visit: Payer: Self-pay

## 2021-05-11 VITALS — BP 122/63 | HR 92 | Ht 64.0 in

## 2021-05-11 DIAGNOSIS — N390 Urinary tract infection, site not specified: Secondary | ICD-10-CM | POA: Diagnosis present

## 2021-05-11 DIAGNOSIS — R079 Chest pain, unspecified: Secondary | ICD-10-CM | POA: Diagnosis not present

## 2021-05-11 DIAGNOSIS — R300 Dysuria: Secondary | ICD-10-CM | POA: Diagnosis not present

## 2021-05-11 HISTORY — DX: Chest pain, unspecified: R07.9

## 2021-05-11 LAB — POCT URINALYSIS DIP (MANUAL ENTRY)
Bilirubin, UA: NEGATIVE
Glucose, UA: >=1000 mg/dL — AB
Ketones, POC UA: NEGATIVE mg/dL
Nitrite, UA: NEGATIVE
Protein Ur, POC: 100 mg/dL — AB
Spec Grav, UA: 1.015 (ref 1.010–1.025)
Urobilinogen, UA: 0.2 E.U./dL
pH, UA: 5.5 (ref 5.0–8.0)

## 2021-05-11 LAB — POCT UA - MICROSCOPIC ONLY: WBC, Ur, HPF, POC: >20 (ref 0–5)

## 2021-05-11 MED ORDER — HYDROXYZINE PAMOATE 25 MG PO CAPS: 25.0000 mg | ORAL_CAPSULE | Freq: Three times a day (TID) | ORAL | 0 refills | Status: DC | PRN

## 2021-05-11 NOTE — Patient Instructions (Signed)
It was wonderful to meet you today.  Please bring ALL of your medications with you to every visit.   Today we talked about:  Your chest pain. Unfortunately, I think this is likely residual from your recent COVID infection.  Fortunately, I do not think this is related to your heart.  This could take several months to go away.  There may also be in anxiety component to this.  I have sent in a medication called hydroxyzine that you can use as needed for anxiety.  We took a urine sample today to evaluate for possible urinary tract infection.  Since you have been on recent antibiotics, it is important that we get a urine culture to see what bacteria grows and if there is any resistance to antibiotics.  I will let you know the results of this when I receive them.  Thank you for choosing [facility].   Please call [phone] with any questions about today's appointment.  Please be sure to schedule follow up at the front  desk before you leave today.   Sharion Settler, DO PGY-1 Family Medicine

## 2021-05-11 NOTE — Assessment & Plan Note (Addendum)
Patient continues to have intermittent chest pain, worse at night and self-resolves.  She associates it with her BiPAP.  Has been seen in the emergency department for similar pains with negative cardiac work-up.  Low concern for ACS at this time given pain is the same and previously with negative cardiac work-up.  Likely residual effects from her recent COVID infection versus anxiety.  She does believe that there is an anxiety component contributing.  Will trial hydroxyzine for this.  -Hydroxyzine 25 mg TID PRN anxiety

## 2021-05-11 NOTE — Progress Notes (Signed)
SUBJECTIVE:   CHIEF COMPLAINT / HPI:   Chest Pain Patient presents complaining of intermittent pressure-like central chest pain.  Wears a BiPAP at night, previously is using 2 L of oxygen with this, however recently has stopped using supplemental oxygen with this.  She believes that she was getting too much oxygen at night which was causing her to have these chest pains.  The pain resolves after sitting up on the side of the bed.  It typically does not persist and goes away as the day goes on.  The pain seems to be worse at night.  She has been seen in the emergency department for this and cardiac work-up with EKG and troponins were negative and reassuring.  This is the same type of pain that she has been seen for in the emergency department.  Dysuria States that she was recently diagnosed with a kidney infection in the hospital.  She remembers taking a 7-day course of antibiotics but states that it did not work.  She continues to have pain with peeing and feels as though she is peeing less.  Denies any hematuria, fevers, vaginal discharge.  Denies history of frequent UTIs, states her last 1 was about 1-1/2 years ago.  Does endorse bilateral back pain that is new.  She has been taking over-the-counter Azo without relief.  PERTINENT  PMH / PSH:  Past Medical History:  Diagnosis Date  . Acute osteomyelitis of ankle or foot, left ([place]) 01/31/2018  . Alveolar hypoventilation   . Anemia    not on iron pill  . Asthma   . Bipolar 2 disorder ([place])   . Carpal tunnel syndrome on right    recurrent  . Cellulitis 08/2010-08/2011  . Chronic pain   . COPD (chronic obstructive pulmonary disease) ([name])    Symbicort daily and Proventil as needed  . Costochondritis   . Diabetes mellitus type II, uncontrolled ([place]) 2000   Type 2, Uncontrolled.Takes Lantus daily.Fasting blood sugar runs 150  . Drug-seeking behavior   . GERD (gastroesophageal reflux disease)    takes Pantoprazole and Zantac daily  . HLD  (hyperlipidemia)    takes Atorvastatin daily  . Hypertension    takes Lisinopril and Coreg daily  . Morbid obesity ([place])   . Nocturia   . OSA on CPAP   . Peripheral neuropathy    takes Gabapentin daily  . Pneumonia    "walking" several yrs ago and as a baby (12/05/2018)  . Rectal fissure   . Restless leg   . SVT (supraventricular tachycardia) ([place])   . Syncope 02/25/2016  . Urinary frequency   . Urinary incontinence 10/23/2020  . Varicose veins    Right medial thigh and Left leg      OBJECTIVE:   BP 122/63   Pulse 92   Ht 5\' 4"  (1.626 m)   LMP 04/08/2021   SpO2 (!) 87%   BMI 63.46 kg/m    General: Morbidly obese woman in a wheelchair, NAD, pleasant Cardiac: RRR, no murmurs. Respiratory: CTAB, normal effort, No wheezes, rales or rhonchi Abdomen: Morbidly obese abdomen, nontender in all quadrants Extremities: Left BKA, with left prosthesis Skin: warm and dry, no rashes noted Psych: Mildly anxious appearing  ASSESSMENT/PLAN:   Chest pain with low risk for cardiac etiology Patient continues to have intermittent chest pain, worse at night and self-resolves.  She associates it with her BiPAP.  Has been seen in the emergency department for similar pains with negative cardiac work-up.  Low  concern for ACS at this time given pain is the same and previously with negative cardiac work-up.  Likely residual effects from her recent COVID infection versus anxiety.  She does believe that there is an anxiety component contributing.  Will trial hydroxyzine for this.  -Hydroxyzine 25 mg TID PRN anxiety  Dysuria Reports continued dysuria since hospitalization.  She was given a week course of Cipro for recent UTI.  She does not feel that she has fully cleared this.  No fevers but does have CVA tenderness bilaterally. -Urinalysis -Urine culture -Antibiotics as appropriate once sensitivities result     Sharion Settler, DO [place]

## 2021-05-11 NOTE — Assessment & Plan Note (Addendum)
Reports continued dysuria since hospitalization.  She was given a week course of Cipro for recent UTI.  She does not feel that she has fully cleared this.  No fevers but does have CVA tenderness bilaterally. UA with only few bacteria, 1+ leukocytes but negative nitrites.  -Urinalysis -Urine culture -Antibiotics as appropriate once sensitivities result

## 2021-05-12 ENCOUNTER — Encounter (HOSPITAL_BASED_OUTPATIENT_CLINIC_OR_DEPARTMENT_OTHER): Payer: Medicaid Other | Admitting: Internal Medicine

## 2021-05-12 ENCOUNTER — Telehealth: Payer: Self-pay

## 2021-05-12 NOTE — Telephone Encounter (Signed)
Med approved for 05/12/2021 - 11/08/2021. The pharmacy has been informed. They delivered to patient this afternoon.

## 2021-05-12 NOTE — Telephone Encounter (Signed)
Completed PA info in [place] for hydrocodone-acetaminophen. Status pending. Will recheck status in 24 hours.

## 2021-05-13 LAB — URINE CULTURE

## 2021-05-14 ENCOUNTER — Other Ambulatory Visit: Payer: Self-pay

## 2021-05-14 ENCOUNTER — Other Ambulatory Visit: Payer: Self-pay | Admitting: Family Medicine

## 2021-05-14 ENCOUNTER — Ambulatory Visit (INDEPENDENT_AMBULATORY_CARE_PROVIDER_SITE_OTHER): Payer: Medicaid Other | Admitting: Internal Medicine

## 2021-05-14 ENCOUNTER — Encounter: Payer: Self-pay | Admitting: Internal Medicine

## 2021-05-14 VITALS — BP 124/82 | HR 94 | Temp 97.6°F | Ht 62.0 in | Wt 379.0 lb

## 2021-05-14 DIAGNOSIS — J9612 Chronic respiratory failure with hypercapnia: Secondary | ICD-10-CM | POA: Diagnosis not present

## 2021-05-14 DIAGNOSIS — E662 Morbid (severe) obesity with alveolar hypoventilation: Secondary | ICD-10-CM

## 2021-05-14 DIAGNOSIS — G4733 Obstructive sleep apnea (adult) (pediatric): Secondary | ICD-10-CM | POA: Diagnosis not present

## 2021-05-14 DIAGNOSIS — J9611 Chronic respiratory failure with hypoxia: Secondary | ICD-10-CM | POA: Diagnosis not present

## 2021-05-14 MED ORDER — FLUCONAZOLE 150 MG PO TABS: 150.0000 mg | ORAL_TABLET | ORAL | 0 refills | Status: DC

## 2021-05-14 NOTE — Patient Instructions (Signed)
The patient should have follow up scheduled with myself in 6 months.   The [facility] respiratory therapist is going to reach out to you to discuss your trilogy vent and make sure it's working ok. Let us know if they do not make contact

## 2021-05-14 NOTE — Progress Notes (Signed)
[name]    [id number]    Mar 22, 1976  Primary Care Physician:McIntyre, [name], MD Date of Appointment: 05/14/2021 Established Patient Visit  Chief complaint:   Chief Complaint  Patient presents with   Follow-up    Sob at all times, Chest pains at night when sleeping using CPAP machine     HPI: [name] is a 45 y.o. woman with super morbid obesity Body mass index is 69.32 kg/m., OSA/OHS and chronic respiratory failure.  Interval Updates: Has had ED visits to [facility] and [facility] in Nov 2021 for chest pain. She is here with her home aide today.  Had covid two months ago.  Her O2 sats are int he high 80s off oxygen.  Stop bleeding in oxygen at night because she felt like she was getting too much air and that chest pains are getting worse. Last for hours, wake her up from sleep, resolve spontaneously sitting up on side of the bed.   She is still taking spiriva. She has not been taking her prn albuterol. Taking torsemide 80 mg daily,.   Have been unable to review her trilogy download up until this time.   I have reviewed the patient's family social and past medical history and updated as appropriate.   Past Medical History:  Diagnosis Date   Acute osteomyelitis of ankle or foot, left ([place]) 01/31/2018   Alveolar hypoventilation    Anemia    not on iron pill   Asthma    Bipolar 2 disorder ([name])    Carpal tunnel syndrome on right    recurrent   Cellulitis 08/2010-08/2011   Chronic pain    COPD (chronic obstructive pulmonary disease) ([name])    Symbicort daily and Proventil as needed   Costochondritis    Diabetes mellitus type II, uncontrolled ([place]) 2000   Type 2, Uncontrolled.Takes Lantus daily.Fasting blood sugar runs 150   Drug-seeking behavior    GERD (gastroesophageal reflux disease)    takes Pantoprazole and Zantac daily   HLD (hyperlipidemia)    takes Atorvastatin daily   Hypertension    takes Lisinopril and Coreg daily   Morbid obesity ([name])    Nocturia     OSA on CPAP    Peripheral neuropathy    takes Gabapentin daily   Pneumonia    "walking" several yrs ago and as a baby (12/05/2018)   Rectal fissure    Restless leg    SVT (supraventricular tachycardia) ([name])    Syncope 02/25/2016   Urinary frequency    Urinary incontinence 10/23/2020   Varicose veins    Right medial thigh and Left leg     Past Surgical History:  Procedure Laterality Date   AMPUTATION Left 02/01/2018   Procedure: LEFT FOURTH AND 5TH TOE RAY AMPUTATION;  Surgeon: [name], MD;  Location: [facility];  Service: Orthopedics;  Laterality: Left;   AMPUTATION Left 03/03/2018   Procedure: LEFT BELOW KNEE AMPUTATION;  Surgeon: [name], MD;  Location: [facility];  Service: Orthopedics;  Laterality: Left;   CARPAL TUNNEL RELEASE Bilateral    CESAREAN SECTION  2007   CORONARY ANGIOGRAPHY N/A 11/26/2020   Procedure: CORONARY ANGIOGRAPHY;  Surgeon: Sherren Mocha, MD;  Location: [facility];  Service: Cardiovascular;  Laterality: N/A;   INCISION AND DRAINAGE PERIRECTAL ABSCESS Left 05/18/2019   Procedure: IRRIGATION AND DEBRIDEMENT OF PANNIS ABSCESS, POSSIBLE DEBRIDEMENT OF BUTTOCK WOUND;  Surgeon: Donnie Mesa, MD;  Location: [facility];  Service: General;  Laterality: Left;   IRRIGATION AND DEBRIDEMENT BUTTOCKS Left 05/17/2019   Procedure: IRRIGATION AND DEBRIDEMENT BUTTOCKS;  Surgeon: Donnie Mesa, MD;  Location: [facility];  Service: General;  Laterality: Left;   KNEE ARTHROSCOPY Right 07/17/2010   LEFT HEART CATHETERIZATION WITH CORONARY ANGIOGRAM N/A 07/27/2012   Procedure: LEFT HEART CATHETERIZATION WITH CORONARY ANGIOGRAM;  Surgeon: Sherren Mocha, MD;  Location: [facility];  Service: Cardiovascular;  Laterality: N/A;   MASS EXCISION N/A 06/29/2013   Procedure:  WIDE LOCAL EXCISION OF POSTERIOR NECK ABSCESS;  Surgeon: Ralene Ok, MD;  Location: [facility];  Service: General;  Laterality: N/A;   REPAIR KNEE LIGAMENT Left    "fixed ligaments and chipped patella"   right  transmetatarsal amputation       Family History  Problem Relation Age of Onset   Diabetes Mother    Hyperlipidemia Mother    Depression Mother    GER disease Mother    Allergic rhinitis Mother    Restless legs syndrome Mother    Heart attack Paternal Uncle    Heart disease Paternal Grandmother    Heart attack Paternal Grandmother    Heart attack Paternal Grandfather    Heart disease Paternal Grandfather    Heart attack Father    Migraines Sister    Cancer Maternal Grandmother        COLON   Hypertension Maternal Grandmother    Hyperlipidemia Maternal Grandmother    Diabetes Maternal Grandmother    Other Maternal Grandfather        GUN SHOT   Anxiety disorder Sister    Asthma Child     Social History   Occupational History   Not on file  Tobacco Use   Smoking status: Former    Packs/day: 0.25    Years: 15.00    Pack years: 3.75    Types: Cigarettes    Quit date: 12/06/2005    Years since quitting: 15.4   Smokeless tobacco: Never   Tobacco comments:    smokes for a couple of months  Vaping Use   Vaping Use: Never used  Substance and Sexual Activity   Alcohol use: No    Alcohol/week: 0.0 standard drinks   Drug use: Not Currently    Comment: OD attempts on home meds     Sexual activity: Yes    Partners: Male     Physical Exam: Blood pressure 124/82, pulse 94, temperature 97.6 F (36.4 C), height 5\' 2"  (1.575 m), weight (!) 379 lb (171.9 kg), SpO2 90 %.  Gen:      No acute distress, morbidly obese Lungs:    No increased respiratory effort, symmetric chest wall excursion, clear to auscultation bilaterally, diminished, no wheezes or crackles CV:         diminished, Regular rate and rhythm; no murmurs, rubs, or gallops.  No pedal edema   Data Reviewed: Imaging: I have personally reviewed the chest xray May 2022 from the ED - low lung volumes, no acute process   PFTs: No flowsheet data found. None on file  Labs: Lab Results  Component Value Date   WBC  9.5 05/05/2021   HGB 11.8 (L) 05/05/2021   HCT 37.5 05/05/2021   MCV 97.7 05/05/2021   PLT 262 05/05/2021   ABG shows hypercapnia and hypoxemia  Immunization status: Immunization History  Administered Date(s) Administered   Influenza-Unspecified 07/22/2017   PFIZER(Purple Top)SARS-COV-2 Vaccination 06/26/2020, 07/17/2020, 12/18/2020   Pneumococcal Polysaccharide-23 09/21/2016   Tdap 05/04/2014, 06/29/2015    Assessment:  Chronic hypercapnic and hypoxemic respiratory failure OHS/OSA HFpEF  Plan/Recommendations: Continue trilogy home vent - I personally called [facility] and spoke with their vent coordinator - they will have a respiratory therapist contact the patient to follow up and will let us know their findings. She will contac [place] if they don't get back to her.  Continue spiriva since she perceives some benefit Continue prn albuterol Continue torsemide.   I spent 30 minutes on 05/14/2021 in care of this patient including face to face time and non-face to face time spent charting, review of outside records, and coordination of care.   Return to Care: Return in about 6 months (around 11/13/2021).   Lenice Llamas, MD Pulmonary and [place]

## 2021-05-15 ENCOUNTER — Encounter (HOSPITAL_BASED_OUTPATIENT_CLINIC_OR_DEPARTMENT_OTHER): Payer: Medicaid Other | Admitting: Internal Medicine

## 2021-05-18 ENCOUNTER — Other Ambulatory Visit: Payer: Self-pay | Admitting: Family Medicine

## 2021-05-18 ENCOUNTER — Telehealth: Payer: Self-pay

## 2021-05-18 DIAGNOSIS — G894 Chronic pain syndrome: Secondary | ICD-10-CM

## 2021-05-18 DIAGNOSIS — Z7409 Other reduced mobility: Secondary | ICD-10-CM

## 2021-05-18 NOTE — Telephone Encounter (Signed)
Patient calls nurse line with the following requests.   -Patient requests that DME order for lift chair/recliner be faxed to Case worker at [facility] CAP program ([place]). Called [facility] and she will fax request to our office to release orders. Fax number for [facility] is [phone].  -Patient is also requesting DME order for [facility] lift. Please route message to RN team once order has been placed for processing.   -Patient also reports continued issues with pain medication not working. Patient is requesting different pain medication or referral to pain management clinic.   -Patient also reports concerns with PA being denied for [place]. Spoke with [facility], medication was approved. Called patient and informed that medication had been approved.   Talbot Grumbling, RN

## 2021-05-19 NOTE — Telephone Encounter (Signed)
I am out of the office this week, Dr. Erin Hearing is covering for me and should be able to sign the order faxed over for the lift chair/recliner.  I have placed a DME order for a hoyer lift in [place] to RN team.  We can discuss pain regimen when I see her in July. I don't recall hearing previously that her pain medicine was not working.  I can place a referral to pain management if that is what patient prefers.  [name], MD

## 2021-05-20 NOTE — Telephone Encounter (Signed)
No order in Dr [name] box on 6/15 at 215PM

## 2021-05-21 ENCOUNTER — Encounter (HOSPITAL_BASED_OUTPATIENT_CLINIC_OR_DEPARTMENT_OTHER): Payer: Medicaid Other | Attending: Internal Medicine | Admitting: Internal Medicine

## 2021-05-21 ENCOUNTER — Other Ambulatory Visit: Payer: Self-pay

## 2021-05-21 DIAGNOSIS — E1140 Type 2 diabetes mellitus with diabetic neuropathy, unspecified: Secondary | ICD-10-CM | POA: Insufficient documentation

## 2021-05-21 DIAGNOSIS — Z87891 Personal history of nicotine dependence: Secondary | ICD-10-CM | POA: Diagnosis not present

## 2021-05-21 DIAGNOSIS — I110 Hypertensive heart disease with heart failure: Secondary | ICD-10-CM | POA: Diagnosis not present

## 2021-05-21 DIAGNOSIS — E1151 Type 2 diabetes mellitus with diabetic peripheral angiopathy without gangrene: Secondary | ICD-10-CM | POA: Insufficient documentation

## 2021-05-21 DIAGNOSIS — E11621 Type 2 diabetes mellitus with foot ulcer: Secondary | ICD-10-CM | POA: Diagnosis present

## 2021-05-21 DIAGNOSIS — I5030 Unspecified diastolic (congestive) heart failure: Secondary | ICD-10-CM | POA: Insufficient documentation

## 2021-05-21 DIAGNOSIS — L97512 Non-pressure chronic ulcer of other part of right foot with fat layer exposed: Secondary | ICD-10-CM | POA: Insufficient documentation

## 2021-05-22 NOTE — Progress Notes (Signed)
[name] ([id number]) Visit Report for 05/21/2021 Arrival Information Details Patient Name: Date of Service: [name] [facility] 05/21/2021 12:30 PM Medical Record Number: [id number] Patient Account Number: [id number] Date of Birth/Sex: Treating RN: 1976-06-15 (45 y.o. [name] Primary Care [name]: [name] Other Clinician: Referring [name]: Treating [name]/Extender: [name]: 32 Visit Information History Since Last Visit Added or deleted any medications: No Patient Arrived: Wheel Chair Any new allergies or adverse reactions: No Arrival Time: 12:46 Had a fall or experienced change in No Transfer Assistance: Manual activities of daily living that may affect Patient Identification Verified: Yes risk of falls: Secondary Verification Process Completed: Yes Signs or symptoms of abuse/neglect since last visito No Patient Requires Transmission-Based Precautions: No Hospitalized since last visit: Yes Patient Has Alerts: No Implantable device outside of the clinic excluding No cellular tissue based products placed in the center since last visit: Has Dressing in Place as Prescribed: Yes Pain Present Now: No Electronic Signature(s) Signed: 05/21/2021 6:07:06 PM By: [name] Entered By: [name] on 05/21/2021 12:48:52 -------------------------------------------------------------------------------- Clinic Level of Care Assessment Details Patient Name: Date of Service: [name] [facility] 05/21/2021 12:30 PM Medical Record Number: [id number] Patient Account Number: [id number] Date of Birth/Sex: Treating RN: 16-Dec-1975 (45 y.o. [name], [name] Primary Care [name]: [name] Other Clinician: Referring [name]: Treating Kiril Hippe/Extender: [name]: 34 Clinic Level of Care Assessment Items TOOL 4 Quantity Score X- 1 0 Use when only an EandM is performed on  FOLLOW-UP visit ASSESSMENTS - Nursing Assessment / Reassessment X- 1 10 Reassessment of Co-morbidities (includes updates in patient status) X- 1 5 Reassessment of Adherence to Hall Plan ASSESSMENTS - Wound and Skin A ssessment / Reassessment []  - 0 Simple Wound Assessment / Reassessment - one wound X- 3 5 Complex Wound Assessment / Reassessment - multiple wounds X- 1 10 Dermatologic / Skin Assessment (not related to wound area) ASSESSMENTS - Focused Assessment X- 1 5 Circumferential Edema Measurements - multi extremities X- 1 10 Nutritional Assessment / Counseling / Intervention []  - 0 Lower Extremity Assessment (monofilament, tuning fork, pulses) []  - 0 Peripheral Arterial Disease Assessment (using hand held doppler) ASSESSMENTS - Ostomy and/or Continence Assessment and Care []  - 0 Incontinence Assessment and Management []  - 0 Ostomy Care Assessment and Management (repouching, etc.) PROCESS - Coordination of Care []  - 0 Simple Patient / Family Education for ongoing care X- 1 20 Complex (extensive) Patient / Family Education for ongoing care X- 1 10 Staff obtains Programmer, systems, Records, T Results / Process Orders est []  - 0 Staff telephones HHA, Nursing Homes / Clarify orders / etc []  - 0 Routine Transfer to another Facility (non-emergent condition) []  - 0 Routine Hospital Admission (non-emergent condition) []  - 0 New Admissions / Biomedical engineer / Ordering NPWT Apligraf, etc. , []  - 0 Emergency Hospital Admission (emergent condition) []  - 0 Simple Discharge Coordination X- 1 15 Complex (extensive) Discharge Coordination PROCESS - Special Needs []  - 0 Pediatric / Minor Patient Management []  - 0 Isolation Patient Management []  - 0 Hearing / Language / Visual special needs []  - 0 Assessment of Community assistance (transportation, D/C planning, etc.) []  - 0 Additional assistance / Altered mentation []  - 0 Support Surface(s) Assessment (bed,  cushion, seat, etc.) INTERVENTIONS - Wound Cleansing / Measurement []  - 0 Simple Wound Cleansing - one wound X- 3 5 Complex Wound Cleansing - multiple wounds X- 1 5 Wound Imaging (photographs - any  number of wounds) []  - 0 Wound Tracing (instead of photographs) []  - 0 Simple Wound Measurement - one wound X- 3 5 Complex Wound Measurement - multiple wounds INTERVENTIONS - Wound Dressings X - Small Wound Dressing one or multiple wounds 1 10 []  - 0 Medium Wound Dressing one or multiple wounds []  - 0 Large Wound Dressing one or multiple wounds []  - 0 Application of Medications - topical []  - 0 Application of Medications - injection INTERVENTIONS - Miscellaneous []  - 0 External ear exam []  - 0 Specimen Collection (cultures, biopsies, blood, body fluids, etc.) []  - 0 Specimen(s) / Culture(s) sent or taken to Lab for analysis []  - 0 Patient Transfer (multiple staff / Civil Service fast streamer / Similar devices) []  - 0 Simple Staple / Suture removal (25 or less) []  - 0 Complex Staple / Suture removal (26 or more) []  - 0 Hypo / Hyperglycemic Management (close monitor of Blood Glucose) []  - 0 Ankle / Brachial Index (ABI) - do not check if billed separately X- 1 5 Vital Signs Has the patient been seen at the hospital within the last three years: Yes Total Score: 150 Level Of Care: New/Established - Level 4 Electronic Signature(s) Signed: 05/21/2021 5:31:56 PM By: [name] Entered By: [name] on 05/21/2021 13:13:52 -------------------------------------------------------------------------------- Encounter Discharge Information Details Patient Name: Date of Service: [name] [facility] 05/21/2021 12:30 PM Medical Record Number: [id number] Patient Account Number: [id number] Date of Birth/Sex: Treating RN: October 12, 1976 (45 y.o. [name] Primary Care [name]: [name] Other Clinician: Referring [name]: Treating [name]: [name],  [name] in Hall: 34 Encounter Discharge Information Items Post Procedure Vitals Discharge Condition: Stable Temperature (F): 98.1 Ambulatory Status: Wheelchair Pulse (bpm): 96 Discharge Destination: Home Respiratory Rate (breaths/min): 18 Transportation: Other Blood Pressure (mmHg): 135/82 Accompanied By: caregiver Schedule Follow-up Appointment: Yes Clinical Summary of Care: Patient Declined Notes transportation service Electronic Signature(s) Signed: 05/21/2021 5:31:32 PM By: Baruch Gouty RN, BSN Entered By: Baruch Gouty on 05/21/2021 13:33:21 -------------------------------------------------------------------------------- Lower Extremity Assessment Details Patient Name: Date of Service: [name] [facility] 05/21/2021 12:30 PM Medical Record Number: [id number] Patient Account Number: [id number] Date of Birth/Sex: Treating RN: 1976/05/12 (45 y.o. [name] Primary Care Yanilen Adamik: [name] Other Clinician: Referring Etsuko Dierolf: Treating Mazzie Brodrick/Extender: [name]: 34 Edema Assessment Assessed: Shirlyn Goltz: No] Patrice Paradise: Yes] Edema: [Left: Ye] [Right: s] Calf Left: Right: Point of Measurement: 25 cm From Medial Instep 44.5 cm Ankle Left: Right: Point of Measurement: 7 cm From Medial Instep 23.5 cm Vascular Assessment Pulses: Dorsalis Pedis Palpable: [Right:Yes] Electronic Signature(s) Signed: 05/21/2021 6:07:06 PM By: [name] Entered By: [name] on 05/21/2021 12:55:01 -------------------------------------------------------------------------------- Multi Wound Chart Details Patient Name: Date of Service: [name] [facility] 05/21/2021 12:30 PM Medical Record Number: [id number] Patient Account Number: [id number] Date of Birth/Sex: Treating RN: 1976/02/09 (46 y.o. [name], [name] Primary Care Taraya Steward: [name] Other Clinician: Referring Rey Fors: Treating Jonnell Hentges/Extender: [name]: 34 Vital Signs Height(in): 72 Pulse(bpm): 20 Weight(lbs): 51 Blood Pressure(mmHg): 135/82 Body Mass Index(BMI): 66 Temperature(F): 98.1 Respiratory Rate(breaths/min): 22 Photos: [4:No Photos Right Amputation Site -] [5:No Photos Right, Posterior Lower Leg] [6:No Photos Right Groin] Wound Location: [4:Transmetatarsal Gradually Appeared] [5:Not Known] [6:Blister] Wounding Event: [4:Diabetic Wound/Ulcer of the Lower] [5:Diabetic Wound/Ulcer of the Lower] [6:Abscess] Primary Etiology: [4:Extremity Asthma, Chronic Obstructive] [5:Extremity Asthma, Chronic Obstructive] [6:Asthma, Chronic Obstructive] Comorbid History: [4:Pulmonary Disease (COPD), Sleep Apnea, Congestive Heart Failure, Hypertension, Peripheral Venous Disease, Type  II Diabetes, Osteoarthritis, Osteomyelitis, Neuropathy 03/06/2020] [5:Pulmonary Disease (COPD), Sleep Apnea, Congestive  Heart Failure, Hypertension, Peripheral Venous Disease, Type II Diabetes, Osteoarthritis, Osteomyelitis, Neuropathy 11/05/2020] [6:Pulmonary Disease (COPD), Sleep Apnea, Congestive Heart Failure, Hypertension, Peripheral Venous Disease, Type II Diabetes,  Osteoarthritis, Osteomyelitis, Neuropathy 02/20/2021] Date Acquired: [4:34] [5:22] [6:12] Weeks of Hall: [4:Open] [5:Healed - Epithelialized] [6:Healed - Epithelialized] Wound Status: [4:0.5x0.3x0.1] [5:0x0x0] [6:0x0x0] Measurements L x W x D (cm) [4:0.118] [5:0] [6:0] A (cm) : rea [4:0.012] [5:0] [6:0] Volume (cm) : [4:86.30%] [5:100.00%] [6:100.00%] % Reduction in Area: [4:95.40%] [5:100.00%] [6:100.00%] % Reduction in Volume: [4:Grade 2] [5:Grade 2] [6:Full Thickness Without Exposed] Classification: [4:Medium] [5:N/A] [6:Support Structures N/A] Exudate A mount: [4:Serosanguineous] [5:N/A] [6:N/A] Exudate Type: [4:red, brown] [5:N/A] [6:N/A] Exudate Color: [4:Distinct, outline attached] [5:N/A] [6:N/A] Wound Margin: [4:Large (67-100%)]  [5:N/A] [6:N/A] Granulation Amount: [4:Red] [5:N/A] [6:N/A] Granulation Quality: [4:Small (1-33%)] [5:N/A] [6:N/A] Necrotic Amount: [4:Eschar] [5:N/A] [6:N/A] Necrotic Tissue: [4:Fascia: No] [5:N/A] [6:N/A] Exposed Structures: [4:Fat Layer (Subcutaneous Tissue): No Tendon: No Muscle: No Joint: No Bone: No Large (67-100%)] [5:N/A] [6:N/A] Epithelialization: [4:Debridement - Selective/Open Wound] [5:N/A] [6:N/A] Debridement: Pre-procedure Verification/Time Out 13:00 [5:N/A] [6:N/A] Taken: [4:Lidocaine 4% Topical Solution] [5:N/A] [6:N/A] Pain Control: [4:Necrotic/Eschar] [5:N/A] [6:N/A] Tissue Debrided: [4:Non-Viable Tissue] [5:N/A] [6:N/A] Level: [4:1] [5:N/A] [6:N/A] Debridement A (sq cm): [4:rea Curette] [5:N/A] [6:N/A] Instrument: [4:None] [5:N/A] [6:N/A] Bleeding: [4:0] [5:N/A] [6:N/A] Procedural Pain: [4:0] [5:N/A] [6:N/A] Post Procedural Pain: [4:Procedure was tolerated well] [5:N/A] [6:N/A] Debridement Hall Response: [4:0.5x0.3x0.1] [5:N/A] [6:N/A] Post Debridement Measurements L x W x D (cm) [4:0.012] [5:N/A] [6:N/A] Post Debridement Volume: (cm) [4:Debridement] [5:N/A] [6:N/A] Hall Notes Electronic Signature(s) Signed: 05/21/2021 5:31:56 PM By: [name] Signed: 05/22/2021 10:33:35 AM By: Linton Ham MD Entered By: Linton Ham on 05/21/2021 13:17:24 -------------------------------------------------------------------------------- Multi-Disciplinary Care Plan Details Patient Name: Date of Service: Belinda Dolin, Earley Favor [facility] 05/21/2021 12:30 PM Medical Record Number: [id number] Patient Account Number: [id number] Date of Birth/Sex: Treating RN: [birth year] (45 y.o. [name] Primary Care Nyelah Emmerich: [name] Other Clinician: Referring Sagar Tengan: Treating Euphemia Lingerfelt/Extender: Raul Del in Hall: [place] reviewed with physician Active Inactive Electronic Signature(s) Signed: 05/21/2021 5:31:56 PM  By: [name] Entered By: [name] on 05/21/2021 12:58:16 -------------------------------------------------------------------------------- Pain Assessment Details Patient Name: Date of Service: [name] [facility] 05/21/2021 12:30 PM Medical Record Number: [id number] Patient Account Number: [id number] Date of Birth/Sex: Treating RN: 01/02/1976 (45 y.o. [name] Primary Care Laiana Fratus: [name] Other Clinician: Referring Tariana Moldovan: Treating Jet Traynham/Extender: [name]: 34 Active Problems Location of Pain Severity and Description of Pain Patient Has Paino No Site Locations Pain Management and Medication Current Pain Management: Electronic Signature(s) Signed: 05/21/2021 6:07:06 PM By: [name] Entered By: [name] on 05/21/2021 12:48:39 -------------------------------------------------------------------------------- Patient/Caregiver Education Details Patient Name: Date of Service: [name] [facility] 6/16/2022andnbsp12:30 PM Medical Record Number: [id number] Patient Account Number: [id number] Date of Birth/Gender: Treating RN: Dec 21, 1975 (45 y.o. [name] Primary Care Physician: [name] Other Clinician: Referring Physician: Treating Physician/Extender: [name]: 34 Education Assessment Education Provided To: Patient Education Topics Provided Wound/Skin Impairment: Handouts: Skin Care Do's and Dont's Methods: Explain/Verbal Responses: Reinforcements needed Electronic Signature(s) Signed: 05/21/2021 5:31:56 PM By: [name] Entered By: [name] on 05/21/2021 12:58:30 -------------------------------------------------------------------------------- Wound Assessment Details Patient Name: Date of Service: [name] [facility] 05/21/2021 12:30 PM Medical Record Number: [id number] Patient Account Number: [id number] Date of  Birth/Sex: Treating RN: Jan 24, 1976 (45 y.o. 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 Wound Open Wounding Event: Gradually Appeared Status: Date Acquired: 03/06/2020 Comorbid Asthma, Chronic Obstructive Pulmonary Disease (COPD), Sleep Weeks Of Hall: 34 History: Apnea, Congestive Heart Failure, Hypertension, Peripheral Venous Clustered Wound: No Disease, Type II Diabetes, Osteoarthritis, Osteomyelitis, Neuropathy Photos Wound Measurements Length: (cm) 0.5 Width: (cm) 0.3 Depth: (cm) 0.1 Area: (cm) 0.118 Volume: (cm) 0.012 % Reduction in Area: 86.3% % Reduction in Volume: 95.4% Epithelialization: Large (67-100%) Tunneling: No Undermining: No Wound Description Classification: Grade 2 Wound Margin: Distinct, outline attached Exudate Amount: Medium Exudate Type: Serosanguineous Exudate Color: red, brown Foul Odor After Cleansing: No Slough/Fibrino No Wound Bed Granulation Amount: Large (67-100%) Exposed Structure Granulation Quality: Red Fascia Exposed: No Necrotic Amount: Small (1-33%) Fat Layer (Subcutaneous Tissue) Exposed: No Necrotic Quality: Eschar Tendon Exposed: No Muscle Exposed: No Joint Exposed: No Bone Exposed: No Hall Notes Wound #4 (Amputation Site - Transmetatarsal) Wound Laterality: Right Cleanser Peri-Wound Care Topical Primary Dressing Secondary Dressing Zetuvit Plus Silicone Border Dressing 4x4 (in/in) Discharge Instruction: ***protect area with foam/pad area.***Apply silicone border over primary dressing as directed. Secured With Compression Wrap Compression Stockings Add-Ons Electronic Signature(s) Signed: 05/21/2021 5:21:02 PM By: Sandre Kitty Signed: 05/21/2021 5:31:56 PM By: [name] Signed: 05/21/2021 5:31:56 PM By: [name] Entered By: Sandre Kitty on 05/21/2021 16:53:23 -------------------------------------------------------------------------------- Wound Assessment Details Patient Name: Date of Service: [name] [facility] 05/21/2021 12:30 PM Medical Record Number: [id number] Patient Account Number: [id number] Date of Birth/Sex: Treating RN: [birth year] (45 y.o. [name] Primary Care Willeen Novak: [name] Other Clinician: Referring Maliek Schellhorn: Treating Gem Conkle/Extender: [name]: 34 Wound Status Wound Number: 5 Primary Diabetic Wound/Ulcer of the Lower Extremity Etiology: Wound Location: Right, Posterior Lower Leg Wound Healed - Epithelialized Wounding Event: Not Known Status: Date Acquired: 11/05/2020 Comorbid Asthma, Chronic Obstructive Pulmonary Disease (COPD), Sleep Weeks Of Hall: 22 History: Apnea, Congestive Heart Failure, Hypertension, Peripheral Venous Clustered Wound: No Disease, Type II Diabetes, Osteoarthritis, Osteomyelitis, Neuropathy Wound Measurements Length: (cm) Width: (cm) Depth: (cm) Area: (cm) Volume: (cm) 0 % Reduction in Area: 100% 0 % Reduction in Volume: 100% 0 0 0 Wound Description Classification: Grade 2 Electronic Signature(s) Signed: 05/21/2021 6:07:06 PM By: [name] Entered By: [name] on 05/21/2021 12:56:14 -------------------------------------------------------------------------------- Wound Assessment Details Patient Name: Date of Service: [name] [facility] 05/21/2021 12:30 PM Medical Record Number: [id number] Patient Account Number: [id number] Date of Birth/Sex: Treating RN: [birth year] (45 y.o. [name] Primary Care Tawonna Esquer: [name] Other Clinician: Referring Haeven Nickle: Treating Lawrnce Reyez/Extender: [name]:  34 Wound Status Wound Number: 6 Primary Abscess Etiology: Wound Location: Right Groin Wound Healed - Epithelialized Wounding Event: Blister Status: Date Acquired: 02/20/2021 Comorbid Asthma, Chronic Obstructive Pulmonary Disease (COPD), Sleep Weeks Of Hall: 12 History: Apnea, Congestive Heart Failure, Hypertension, Peripheral Venous Clustered Wound: No Disease, Type II Diabetes, Osteoarthritis, Osteomyelitis, Neuropathy Wound Measurements Length: (cm) Width: (cm) Depth: (cm) Area: (cm) Volume: (cm) 0 % Reduction in Area: 100% 0 % Reduction in Volume: 100% 0 0 0 Wound Description Classification: Full Thickness Without Exposed Support Structur es Electronic Signature(s) Signed: 05/21/2021 6:07:06 PM By: [name] Entered By: [name] on 05/21/2021 12:55:54 -------------------------------------------------------------------------------- Vitals Details Patient Name: Date of Service: [name] [facility] 05/21/2021 12:30 PM Medical Record Number: [id number] Patient  Account Number: [id number] Date of Birth/Sex: Treating RN: February 15, 1976 (45 y.o. [name] Primary Care Ezequiel Macauley: [name] Other Clinician: Referring Lindsay Straka: Treating Bartley Vuolo/Extender: [name]: 34 Vital Signs Time Taken: 12:46 Temperature (F): 98.1 Height (in): 62 Pulse (bpm): 96 Weight (lbs): 362 Respiratory Rate (breaths/min): 22 Body Mass Index (BMI): 66.2 Blood Pressure (mmHg): 135/82 Reference Range: 80 - 120 mg / dl Electronic Signature(s) Signed: 05/21/2021 6:07:06 PM By: [name] Entered By: [name] on 05/21/2021 12:48:30

## 2021-05-22 NOTE — Progress Notes (Signed)
Belinda, Hall ([id number]) Visit Report for 05/21/2021 Debridement Details Patient Name: Date of Service: [name] [facility] 05/21/2021 12:30 PM Medical Record Number: [id number] Patient Account Number: [id number] Date of Birth/Sex: Treating RN: 22-Jul-1976 (45 y.o. [name], [name] Primary Care Provider: [name] Other Clinician: Referring Provider: Treating Provider/Extender: Mammie Russian, Ivan Anchors in Treatment: 34 Debridement Performed for Assessment: Wound #4 Right Amputation Site - Transmetatarsal Performed By: Physician Ricard Dillon., MD Debridement Type: Debridement Severity of Tissue Pre Debridement: Fat layer exposed Level of Consciousness (Pre-procedure): Awake and Alert Pre-procedure Verification/Time Out Yes - 13:00 Taken: Start Time: 13:01 Pain Control: Lidocaine 4% Topical Solution T Area Debrided (L x W): otal 1 (cm) x 1 (cm) = 1 (cm) Tissue and other material debrided: Non-Viable, Eschar Level: Non-Viable Tissue Debridement Description: Selective/Open Wound Instrument: Curette Bleeding: None End Time: 13:05 Procedural Pain: 0 Post Procedural Pain: 0 Response to Treatment: Procedure was tolerated well Level of Consciousness (Post- Awake and Alert procedure): Post Debridement Measurements of Total Wound Length: (cm) 0.5 Width: (cm) 0.3 Depth: (cm) 0.1 Volume: (cm) 0.012 Character of Wound/Ulcer Post Debridement: Improved Severity of Tissue Post Debridement: Fat layer exposed Post Procedure Diagnosis Same as Pre-procedure Electronic Signature(s) Signed: 05/21/2021 5:31:56 PM By: Deon Pilling Signed: 05/22/2021 10:33:35 AM By: Linton Ham MD Entered By: Linton Ham on 05/21/2021 13:17:34 -------------------------------------------------------------------------------- HPI Details Patient Name: Date of Service: [name], [name] 05/21/2021 12:30 PM Medical Record Number: [id number] Patient Account Number: [id number] Date of  Birth/Sex: Treating RN: [birth year] (45 y.o. [name] Primary Care Provider: [name] Other Clinician: Referring Provider: Treating Provider/Extender: Mammie Russian, Ivan Anchors in Treatment: 34 History of Present Illness HPI Description: 05/30/19 patient presents today for initial evaluation our clinic concerning significant wounds that she has in the right gluteal region as well as in the left abdominal area. She also has a pressure injury to her trans metatarsal amputation site on the right which does not appear to be as looking at this time but nonetheless does need to close. This occurred while she was in the hospital. The patient was admitted to the hospital on 05/17/19 discharged on 05/25/19. This was due to the significant abscess in the right gluteal region as well as in the left panus region. She has a history of diabetes which is definitely not under good control of the most useful view of an agency being 17.5. She also has hypertension, COPD, and is morbidly obese. Unfortunately the wound and the right gluteal region extends right up and adjacent to the anus which is going to make the Wound VAC impossible at this point. Nonetheless I think that we're gonna have to basically pack this with wet to dry packing at this time in order to hopefully achieve improvement. Nonetheless this is a significant wound with her diabetes be not under control along with her size and again the nature of the wound I think this is gonna take a very long time to heal. 07/04/2019 upon evaluation today patient actually appears to be doing better in regard to her wounds. Her foot ulcer actually appears to be completely healed which is great the abdominal ulcer has filled in quite nicely and seems to be doing well although there is a lot of moisture I think that she would benefit from a silver alginate dressing. Nonetheless in regard to the wound on her right gluteal region this showed  signs of improvement today still this is a fairly significant ulcer unfortunately. 08/01/2019 on  evaluation patient presents for follow-up concerning her wounds over the right gluteal region as well as the left abdominal region. Fortunately both areas are showing signs of improvement which is good news. In fact significant improvement. Spent about a month since have seen her last. Overall I am extremely happy though with the findings of how the wounds have progressed at this point. READMISSION 09/22/2020 This is a now 45 year old woman previously in this clinic for a right gluteal abscess for 3 or 4 visits in 2020. She is a poorly controlled type II diabetic with the last hemoglobin A1c I see in epic at 12.7 in July 2021. She tells [place] that she is here for review of a wound on the tip of her right first metatarsal transmetatarsal amputation. She had an angiogram and angioplasty on 12/20/2018 with angioplasty of her anterior tibial and posterior tibial arteries. She has been followed by Dr. Doran Durand most recent treatment since August I think has been Santyl. She was given a 1 week course of Keflex in mid September. The patient's had an x-ray and also an MRI in August 2021 that did not show osteomyelitis but edema ABI in our clinic was 1.19 Past medical history includes hypertension, hyperlipidemia, COPD, bipolar disorder, poorly controlled type 2 diabetes with PAD. Left BKA, right transmetatarsal amputation, heart failure with preserved ejection fraction, she is minimally I am ambulatory walking only from her wheelchair to the bathroom with her walker. She only wears a stocking on her foot. Otherwise she is in wheelchair or in bed all day long. 11/1; patient readmitted to the clinic 2 weeks ago. She has a wound on the tip of the right first metatarsal transmetatarsal amputation site she has been using Santyl. She has Medicaid and we have to apply for assistance if we are going to continue with Santyl  although I am hopeful that we will be able to change from the [facility] next week. 11/9; area on the tip of the right first metatarsal amputation site. She has been using Santyl. She was in the hospital overnight for chest pain and they gave her another tube of Santyl which is nice since she has Medicaid and Medicaid would not pay for that. Her wound is smaller 12/2; tip of the right first metatarsal amputation site. She has been using Santyl. No changes wound dimension however I think the surface of the wound is better 12/28; patient has not been here in 3 and half weeks I think transportation issues. We gave her Hydrofera Blue last time however she switched back to [place] last week because somebody told her the wound was larger. The wound is actually measuring smaller and looks healthy. 1/13; since the patient was last here she was actually seen twice in the emergency department most recently on 12/30 this was for a painful small area on the right posterior calf superiorly. Previously she was seen in the ER on 12/19 for this problem but she did not tell us about this the last time she was here. There was some question of this being a spider bite based I think on somebody killing a spider in her apartment. When she was in the ER on 12/30 she complained of increasing pain and swelling. She had previously been given doxycycline but they treated with IV clindamycin transitioning to oral clindamycin. She is still using Hydrofera Blue to the area on the medial transmit amputation on the right this is not changed much today 1/27; 2-week follow-up. The patient has Hydrofera Blue on  the medial transmet amputation site. This looks better this week after debridement last week. She has a new area on the right posterior calf that we looked at last time she has been using Santyl to this that she had leftover from the days where Medicaid paid for this. 2/3; she did not tolerate the Iodoflex well on the back of her  leg although the surface of it looks a lot better today. She was approved for Santyl to this area through compassionate release. We are using Hydrofera Blue on the transmet wound on the first met head 2/10; we have been using Santyl on the posterior right leg and the wound on the medial first met head. 02/06/2021 upon evaluation today patient appears to be doing poorly in regard to her posterior leg as well as her foot ulcer. Both are showing signs of erythema and potentially infection at this point as well. There does not appear to be any signs of active infection locally that is a different story. There does not appear to be any signs of overall worsening with regard to the wounds but they are also not doing quite as well as it appears it was previous. I think that the [facility] however is a better dressing probably we can switch to using this on the posterior leg as well. Collagen may be appropriate at some point but I do not think were quite there yet. 3/18; the patient has a wound on her posterior right leg and the anterior part of her transmet site on the right foot. She was started on an antibiotic 2 weeks ago Levaquin she is still has 3 days above left. We have been using Hydrofera Blue in general the areas look better. HOWEVER she arrives in clinic today with a concern about an abscess in her right groin. Apparently she discovered this recently. Her daughter looked at it last night. 3/25; areas on the transmitted site on the right foot and on the right lower leg both looked better and her measuring smaller. She came in last week with what looked like an abscess site in the dependent part of her abdominal pannus in her groin. I cultured this area that did not specifically grow anything. This initially had wide tunneling depth although it is seems filled in quite well. 4/8; 2-week follow-up. The area on the transmet site on the right foot at the first toe level swollen and erythematous. Not  sure when this should started to deteriorate. She also tells me that she has been using this quite a bit more. The area on the right posterior calf had eschar on it this was removed with gauze this looks clean. The abscess in her groin has come in quite a bit. 6/16; patient has not been here in over 2 months. She was hospitalized from 5/10 through 5/14. She has not been here since then. Everything really is closed including the right groin right posterior calf. She had thick eschar on the area on her right transmit site. She has not been dressing this. It does not sound like she has had this foot in footwear. Electronic Signature(s) Signed: 05/22/2021 10:33:35 AM By: Linton Ham MD Entered By: Linton Ham on 05/21/2021 13:19:00 -------------------------------------------------------------------------------- Physical Exam Details Patient Name: Date of Service: [name], TA [facility] 05/21/2021 12:30 PM Medical Record Number: [id number] Patient Account Number: [id number] Date of Birth/Sex: Treating RN: [birth year] (45 y.o. [name] Primary Care Provider: [name] Other Clinician: Referring Provider: Treating Provider/Extender: Mammie Russian, [place]  Weeks in Treatment: 34 Constitutional Sitting or standing Blood Pressure is within target range for patient.. Pulse regular and within target range for patient.Marland Kitchen Respirations regular, non-labored and within target range.. Temperature is normal and within the target range for the patient.Marland Kitchen Appears in no distress. Notes Wound exam The original wound on the right transmit. Had eschar on the surface I gently remove this this is not closed underneath this but is smaller and the surface looks healthy. There is no evidence of surrounding infection. The area on the right posterior calf is closed and the area in the right groin is closed Electronic Signature(s) Signed: 05/22/2021 10:33:35 AM By: Linton Ham MD Entered By:  Linton Ham on 05/21/2021 13:19:45 -------------------------------------------------------------------------------- Physician Orders Details Patient Name: Date of Service: [name], TA [facility] 05/21/2021 12:30 PM Medical Record Number: [id number] Patient Account Number: [id number] Date of Birth/Sex: Treating RN: 1975-12-22 (45 y.o. 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 - Dr. Dellia Nims Return A Bathing/ Shower/ Hygiene May shower and wash wound with soap and water. Edema Control - Lymphedema / SCD / Other Right Lower Extremity Elevate legs to the level of the heart or above for 30 minutes daily and/or when sitting, a frequency of: - 3-4 times throughout the day. Avoid standing for long periods of time. Exercise regularly - may wear diabetic shoes now, may restart physical therapy, may start walking. Just ensure the filler in the diabetic shoe does not rub on the wound area. Moisturize legs daily. Off-Loading Other: - ensure no pressure to foot wound. Wound Treatment Wound #4 - Amputation Site - Transmetatarsal Wound Laterality: Right Secondary Dressing: Zetuvit Plus Silicone Border Dressing 4x4 (in/in) 2 x Per Week Discharge Instructions: ***protect area with foam/pad area.***Apply silicone border over primary  dressing as directed. Electronic Signature(s) Signed: 05/21/2021 5:31:56 PM By: Deon Pilling Signed: 05/22/2021 10:33:35 AM By: Linton Ham MD Entered By: Deon Pilling on 05/21/2021 13:08:43 -------------------------------------------------------------------------------- Problem List Details Patient Name: Date of Service: [name], TA [facility] 05/21/2021 12:30 PM Medical Record Number: [id number] Patient Account Number: [id number] Date of Birth/Sex: Treating RN: 05/09/1976 (45 y.o. 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 12/18/2020 No Yes L02.214 Cutaneous abscess of groin 02/20/2021 No Yes L97.512 Non-pressure chronic ulcer of other part of right foot with fat layer exposed 09/22/2020 No Yes L03.115 Cellulitis of right lower limb 03/13/2021 No Yes Inactive Problems Resolved Problems Electronic Signature(s) Signed: 05/22/2021 10:33:35 AM By: Linton Ham MD Entered By: Linton Ham on 05/21/2021 13:17:15 -------------------------------------------------------------------------------- Progress Note Details Patient Name: Date of  Service: [name] [facility] 05/21/2021 12:30 PM Medical Record Number: [id number] Patient Account Number: [id number] Date of Birth/Sex: Treating RN: 1976/07/27 (45 y.o. [name] Primary Care Provider: [name] Other Clinician: Referring Provider: Treating Provider/Extender: Mammie Russian, Ivan Anchors in  Treatment: 34 Subjective History of Present Illness (HPI) 05/30/19 patient presents today for initial evaluation our clinic concerning significant wounds that she has in the right gluteal region as well as in the left abdominal area. She also has a pressure injury to her trans metatarsal amputation site on the right which does not appear to be as looking at this time but nonetheless does need to close. This occurred while she was in the hospital. The patient was admitted to the hospital on 05/17/19 discharged on 05/25/19. This was due to the significant abscess in the right gluteal region as well as in the left panus region. She has a history of diabetes which is definitely not under good control of the most useful view of an agency being 17.5. She also has hypertension, COPD, and is morbidly obese. Unfortunately the wound and the right gluteal region extends right up and adjacent to the anus which is going to make the Wound VAC impossible at this point. Nonetheless I think that we're gonna have to basically pack this with wet to dry packing at this time in order to hopefully achieve improvement. Nonetheless this is a significant wound with her diabetes be not under control along with her size and again the nature of the wound I think this is gonna take a very long time to heal. 07/04/2019 upon evaluation today patient actually appears to be doing better in regard to her wounds. Her foot ulcer actually appears to be completely healed which is great the abdominal ulcer has filled in quite nicely and seems to be doing well although there is a lot of moisture I think that she would benefit from a silver alginate dressing. Nonetheless in regard to the wound on her right gluteal region this showed signs of improvement today still this is a fairly significant ulcer unfortunately. 08/01/2019 on evaluation patient presents for follow-up concerning her wounds over the right gluteal region as well as the left  abdominal region. Fortunately both areas are showing signs of improvement which is good news. In fact significant improvement. Spent about a month since have seen her last. Overall I am extremely happy though with the findings of how the wounds have progressed at this point. READMISSION 09/22/2020 This is a now 45 year old woman previously in this clinic for a right gluteal abscess for 3 or 4 visits in 2020. She is a poorly controlled type II diabetic with the last hemoglobin A1c I see in epic at 12.7 in July 2021. She tells [place] that she is here for review of a wound on the tip of her right first metatarsal transmetatarsal amputation. She had an angiogram and angioplasty on 12/20/2018 with angioplasty of her anterior tibial and posterior tibial arteries. She has been followed by Dr. Doran Durand most recent treatment since August I think has been Santyl. She was given a 1 week course of Keflex in mid September. The patient's had an x-ray and also an MRI in August 2021 that did not show osteomyelitis but edema ABI in our clinic was 1.19 Past medical history includes hypertension, hyperlipidemia, COPD, bipolar disorder, poorly controlled type 2 diabetes with PAD. Left BKA, right transmetatarsal amputation, heart failure with preserved ejection fraction, she is minimally I am ambulatory walking only  from her wheelchair to the bathroom with her walker. She only wears a stocking on her foot. Otherwise she is in wheelchair or in bed all day long. 11/1; patient readmitted to the clinic 2 weeks ago. She has a wound on the tip of the right first metatarsal transmetatarsal amputation site she has been using Santyl. She has Medicaid and we have to apply for assistance if we are going to continue with Santyl although I am hopeful that we will be able to change from the [facility] next week. 11/9; area on the tip of the right first metatarsal amputation site. She has been using Santyl. She was in the hospital overnight for  chest pain and they gave her another tube of Santyl which is nice since she has Medicaid and Medicaid would not pay for that. Her wound is smaller 12/2; tip of the right first metatarsal amputation site. She has been using Santyl. No changes wound dimension however I think the surface of the wound is better 12/28; patient has not been here in 3 and half weeks I think transportation issues. We gave her Hydrofera Blue last time however she switched back to [place] last week because somebody told her the wound was larger. The wound is actually measuring smaller and looks healthy. 1/13; since the patient was last here she was actually seen twice in the emergency department most recently on 12/30 this was for a painful small area on the right posterior calf superiorly. Previously she was seen in the ER on 12/19 for this problem but she did not tell us about this the last time she was here. There was some question of this being a spider bite based I think on somebody killing a spider in her apartment. When she was in the ER on 12/30 she complained of increasing pain and swelling. She had previously been given doxycycline but they treated with IV clindamycin transitioning to oral clindamycin. She is still using Hydrofera Blue to the area on the medial transmit amputation on the right this is not changed much today 1/27; 2-week follow-up. The patient has Hydrofera Blue on the medial transmet amputation site. This looks better this week after debridement last week. She has a new area on the right posterior calf that we looked at last time she has been using Santyl to this that she had leftover from the days where Medicaid paid for this. 2/3; she did not tolerate the Iodoflex well on the back of her leg although the surface of it looks a lot better today. She was approved for Santyl to this area through compassionate release. We are using Hydrofera Blue on the transmet wound on the first met head 2/10; we have  been using Santyl on the posterior right leg and the wound on the medial first met head. 02/06/2021 upon evaluation today patient appears to be doing poorly in regard to her posterior leg as well as her foot ulcer. Both are showing signs of erythema and potentially infection at this point as well. There does not appear to be any signs of active infection locally that is a different story. There does not appear to be any signs of overall worsening with regard to the wounds but they are also not doing quite as well as it appears it was previous. I think that the [facility] however is a better dressing probably we can switch to using this on the posterior leg as well. Collagen may be appropriate at some point but I do not  think were quite there yet. 3/18; the patient has a wound on her posterior right leg and the anterior part of her transmet site on the right foot. She was started on an antibiotic 2 weeks ago Levaquin she is still has 3 days above left. We have been using Hydrofera Blue in general the areas look better. HOWEVER she arrives in clinic today with a concern about an abscess in her right groin. Apparently she discovered this recently. Her daughter looked at it last night. 3/25; areas on the transmitted site on the right foot and on the right lower leg both looked better and her measuring smaller. She came in last week with what looked like an abscess site in the dependent part of her abdominal pannus in her groin. I cultured this area that did not specifically grow anything. This initially had wide tunneling depth although it is seems filled in quite well. 4/8; 2-week follow-up. The area on the transmet site on the right foot at the first toe level swollen and erythematous. Not sure when this should started to deteriorate. She also tells me that she has been using this quite a bit more. The area on the right posterior calf had eschar on it this was removed with gauze this looks clean. The  abscess in her groin has come in quite a bit. 6/16; patient has not been here in over 2 months. She was hospitalized from 5/10 through 5/14. She has not been here since then. Everything really is closed including the right groin right posterior calf. She had thick eschar on the area on her right transmit site. She has not been dressing this. It does not sound like she has had this foot in footwear. Patient History Unable to Obtain Patient History due to Altered Mental Status. Information obtained from Patient. Family History Cancer - Maternal Grandparents, Diabetes - Mother, Heart Disease - Father,Paternal Grandparents, No family history of Hereditary Spherocytosis, Hypertension, Kidney Disease, Lung Disease, Seizures, Stroke, Thyroid Problems, Tuberculosis. Social History Former smoker, Marital Status - Separated, Alcohol Use - Never, Drug Use - No History, Caffeine Use - Moderate. Medical History Eyes Denies history of Cataracts, Glaucoma, Optic Neuritis Ear/Nose/Mouth/Throat Denies history of Chronic sinus problems/congestion, Middle ear problems Hematologic/Lymphatic Denies history of Anemia, Hemophilia, Human Immunodeficiency Virus, Lymphedema, Sickle Cell Disease Respiratory Patient has history of Asthma, Chronic Obstructive Pulmonary Disease (COPD), Sleep Apnea - uses Bipap with O2 Denies history of Aspiration, Pneumothorax, Tuberculosis Cardiovascular Patient has history of Congestive Heart Failure, Hypertension, Peripheral Venous Disease Denies history of Angina, Arrhythmia, Coronary Artery Disease, Deep Vein Thrombosis, Hypotension, Myocardial Infarction, Peripheral Arterial Disease, Phlebitis, Vasculitis Gastrointestinal Denies history of Cirrhosis , Colitis, Crohnoos, Hepatitis A, Hepatitis B, Hepatitis C Endocrine Patient has history of Type II Diabetes Denies history of Type I Diabetes Genitourinary Denies history of End Stage Renal Disease Immunological Denies  history of Lupus Erythematosus, Raynaudoos, Scleroderma Integumentary (Skin) Denies history of History of Burn Musculoskeletal Patient has history of Osteoarthritis, Osteomyelitis - left lower leg Denies history of Gout, Rheumatoid Arthritis Neurologic Patient has history of Neuropathy Denies history of Dementia, Quadriplegia, Paraplegia, Seizure Disorder Oncologic Denies history of Received Chemotherapy, Received Radiation Psychiatric Denies history of Anorexia/bulimia, Confinement Anxiety Hospitalization/Surgery History - IandD perirectal abscess. - left BKA. - right transmetatarsal amputation. - excision of posterior neck abscess. - cardiac cath. - right knee arthroscopy. - c-section. - chest pain 10/09/20. - 04/14/2021 COVID, PNA, UTI. Medical A Surgical History Notes nd Constitutional Symptoms (General Health) morbid obesity Cardiovascular hyperlipidemia  Gastrointestinal GERD Psychiatric bipolar Objective Constitutional Sitting or standing Blood Pressure is within target range for patient.. Pulse regular and within target range for patient.Marland Kitchen Respirations regular, non-labored and within target range.. Temperature is normal and within the target range for the patient.Marland Kitchen Appears in no distress. Vitals Time Taken: 12:46 PM, Height: 62 in, Weight: 362 lbs, BMI: 66.2, Temperature: 98.1 F, Pulse: 96 bpm, Respiratory Rate: 22 breaths/min, Blood Pressure: 135/82 mmHg. General Notes: Wound exam oo The original wound on the right transmit. Had eschar on the surface I gently remove this this is not closed underneath this but is smaller and the surface looks healthy. There is no evidence of surrounding infection. The area on the right posterior calf is closed and the area in the right groin is closed Integumentary (Hair, Skin) Wound #4 status is Open. Original cause of wound was Gradually Appeared. The date acquired was: 03/06/2020. The wound has been in treatment 34 weeks. The wound is  located on the Right Amputation Site - Transmetatarsal. The wound measures 0.5cm length x 0.3cm width x 0.1cm depth; 0.118cm^2 area and 0.012cm^3 volume. There is no tunneling or undermining noted. There is a medium amount of serosanguineous drainage noted. The wound margin is distinct with the outline attached to the wound base. There is large (67-100%) red granulation within the wound bed. There is a small (1-33%) amount of necrotic tissue within the wound bed including Eschar. Wound #5 status is Healed - Epithelialized. Original cause of wound was Not Known. The date acquired was: 11/05/2020. The wound has been in treatment 22 weeks. The wound is located on the Right,Posterior Lower Leg. The wound measures 0cm length x 0cm width x 0cm depth; 0cm^2 area and 0cm^3 volume. Wound #6 status is Healed - Epithelialized. Original cause of wound was Blister. The date acquired was: 02/20/2021. The wound has been in treatment 12 weeks. The wound is located on the Right Groin. The wound measures 0cm length x 0cm width x 0cm depth; 0cm^2 area and 0cm^3 volume. Assessment Active Problems ICD-10 Type 2 diabetes mellitus with foot ulcer Acquired absence of left leg below knee Type 2 diabetes mellitus with diabetic peripheral angiopathy without gangrene Non-pressure chronic ulcer of right calf with other specified severity Cutaneous abscess of groin Non-pressure chronic ulcer of other part of right foot with fat layer exposed Cellulitis of right lower limb Procedures Wound #4 Pre-procedure diagnosis of Wound #4 is a Diabetic Wound/Ulcer of the Lower Extremity located on the Right Amputation Site - Transmetatarsal .Severity of Tissue Pre Debridement is: Fat layer exposed. There was a Selective/Open Wound Non-Viable Tissue Debridement with a total area of 1 sq cm performed by Ricard Dillon., MD. With the following instrument(s): Curette to remove Non-Viable tissue/material. Material removed includes Eschar  after achieving pain control using Lidocaine 4% T opical Solution. A time out was conducted at 13:00, prior to the start of the procedure. There was no bleeding. The procedure was tolerated well with a pain level of 0 throughout and a pain level of 0 following the procedure. Post Debridement Measurements: 0.5cm length x 0.3cm width x 0.1cm depth; 0.012cm^3 volume. Character of Wound/Ulcer Post Debridement is improved. Severity of Tissue Post Debridement is: Fat layer exposed. Post procedure Diagnosis Wound #4: Same as Pre-Procedure Plan Follow-up Appointments: Return Appointment in 2 weeks. - Dr. Dellia Nims Bathing/ Shower/ Hygiene: May shower and wash wound with soap and water. Edema Control - Lymphedema / SCD / Other: Elevate legs to the level of the heart  or above for 30 minutes daily and/or when sitting, a frequency of: - 3-4 times throughout the day. Avoid standing for long periods of time. Exercise regularly - may wear diabetic shoes now, may restart physical therapy, may start walking. Just ensure the filler in the diabetic shoe does not rub on the wound area. Moisturize legs daily. Off-Loading: Other: - ensure no pressure to foot wound. WOUND #4: - Amputation Site - Transmetatarsal Wound Laterality: Right Secondary Dressing: Zetuvit Plus Silicone Border Dressing 4x4 (in/in) 2 x Per Week/ Discharge Instructions: ***protect area with foam/pad area.***Apply silicone border over primary dressing as directed. 1. Continue with silver alginate and sit Zetuvit to the area on the right transmit site. This should be closed by the next time we see here. 2. I have asked her to bring the footwear in on the right that she is planning on using Electronic Signature(s) Signed: 05/22/2021 10:33:35 AM By: Linton Ham MD Entered By: Linton Ham on 05/21/2021 13:20:40 -------------------------------------------------------------------------------- HxROS Details Patient Name: Date of Service: [name], [name] 05/21/2021 12:30 PM Medical Record Number: [id number] Patient Account Number: [id number] Date of Birth/Sex: Treating RN: [birth year] (45 y.o. [name] Primary Care Provider: [name] Other Clinician: Referring Provider: Treating Provider/Extender: Mammie Russian, Ivan Anchors in Treatment: 31 Unable to Obtain Patient History due to Altered Mental Status Information Obtained From Patient Constitutional Symptoms (General Health) Medical History: Past Medical History Notes: morbid obesity Eyes Medical History: Negative for: Cataracts; Glaucoma; Optic Neuritis Ear/Nose/Mouth/Throat Medical History: Negative for: Chronic sinus problems/congestion; Middle ear problems Hematologic/Lymphatic Medical History: Negative for: Anemia; Hemophilia; Human Immunodeficiency Virus; Lymphedema; Sickle Cell Disease Respiratory Medical History: Positive for: Asthma; Chronic Obstructive Pulmonary Disease (COPD); Sleep Apnea - uses Bipap with O2 Negative for: Aspiration; Pneumothorax; Tuberculosis Cardiovascular Medical History: Positive for: Congestive Heart Failure; Hypertension; Peripheral Venous Disease Negative for: Angina; Arrhythmia; Coronary Artery Disease; Deep Vein Thrombosis; Hypotension; Myocardial Infarction; Peripheral Arterial Disease; Phlebitis; Vasculitis Past Medical History Notes: hyperlipidemia Gastrointestinal Medical History: Negative for: Cirrhosis ; Colitis; Crohns; Hepatitis A; Hepatitis B; Hepatitis C Past Medical History Notes: GERD Endocrine Medical History: Positive for: Type II Diabetes Negative for: Type I Diabetes Time with diabetes: 20 years Treated with: Insulin Genitourinary Medical History: Negative for: End Stage Renal Disease Immunological Medical History: Negative for: Lupus Erythematosus; Raynauds; Scleroderma Integumentary (Skin) Medical History: Negative for: History of Burn Musculoskeletal Medical  History: Positive for: Osteoarthritis; Osteomyelitis - left lower leg Negative for: Gout; Rheumatoid Arthritis Neurologic Medical History: Positive for: Neuropathy Negative for: Dementia; Quadriplegia; Paraplegia; Seizure Disorder Oncologic Medical History: Negative for: Received Chemotherapy; Received Radiation Psychiatric Medical History: Negative for: Anorexia/bulimia; Confinement Anxiety Past Medical History Notes: bipolar Immunizations Pneumococcal Vaccine: Received Pneumococcal Vaccination: No Implantable Devices Yes Hospitalization / Surgery History Type of Hospitalization/Surgery IandD perirectal abscess left BKA right transmetatarsal amputation excision of posterior neck abscess cardiac cath right knee arthroscopy c-section chest pain 10/09/20 04/14/2021 COVID, PNA, UTI Family and Social History Cancer: Yes - Maternal Grandparents; Diabetes: Yes - Mother; Heart Disease: Yes - Father,Paternal Grandparents; Hereditary Spherocytosis: No; Hypertension: No; Kidney Disease: No; Lung Disease: No; Seizures: No; Stroke: No; Thyroid Problems: No; Tuberculosis: No; Former smoker; Marital Status - Separated; Alcohol Use: Never; Drug Use: No History; Caffeine Use: Moderate; Financial Concerns: No; Food, Clothing or Shelter Needs: No; Support System Lacking: No; Transportation Concerns: No Electronic Signature(s) Signed: 05/21/2021 5:31:56 PM By: Deon Pilling Signed: 05/22/2021 10:33:35 AM By: Linton Ham MD Entered By: Deon Pilling on 05/21/2021 12:58:59 -------------------------------------------------------------------------------- SuperBill Details  Patient Name: Date of Service: [name] [facility] 05/21/2021 Medical Record Number: [id number] Patient Account Number: [id number] Date of Birth/Sex: Treating RN: Aug 13, 1976 (45 y.o. [name], [name] Primary Care Provider: [name] Other Clinician: Referring Provider: Treating Provider/Extender: Mammie Russian, Ivan Anchors in Treatment: 34 Diagnosis Coding ICD-10 Codes Code Description E11.621 Type 2 diabetes mellitus with foot ulcer Z89.512 Acquired absence of left leg below knee E11.51 Type 2 diabetes mellitus with diabetic peripheral angiopathy without gangrene L97.218 Non-pressure chronic ulcer of right calf with other specified severity L02.214 Cutaneous abscess of groin L97.512 Non-pressure chronic ulcer of other part of right foot with fat layer exposed L03.115 Cellulitis of right lower limb Facility Procedures CPT4 Code: 00379444 Description: 99214 - WOUND CARE VISIT-LEV 4 EST PT Modifier: Quantity: 1 CPT4 Code: 61901222 Description: 41146 - DEBRIDE WOUND 1ST 20 SQ CM OR < ICD-10 Diagnosis Description L97.512 Non-pressure chronic ulcer of other part of right foot with fat layer exposed Modifier: Quantity: 1 Physician Procedures : CPT4 Code Description Modifier [id number] 70110 - WC PHYS DEBR WO ANESTH 20 SQ CM ICD-10 Diagnosis Description L97.512 Non-pressure chronic ulcer of other part of right foot with fat layer exposed Quantity: 1 Electronic Signature(s) Signed: 05/22/2021 10:33:35 AM By: Linton Ham MD Entered By: Linton Ham on 05/21/2021 13:20:51

## 2021-05-24 IMAGING — DX DG CHEST 2V
1 series · 1 of 1 positions shown · non-contrast
Comparison: 03/26/2020

CLINICAL DATA: Shortness of breath

EXAM:
CHEST - 2 VIEW

[chest ap]
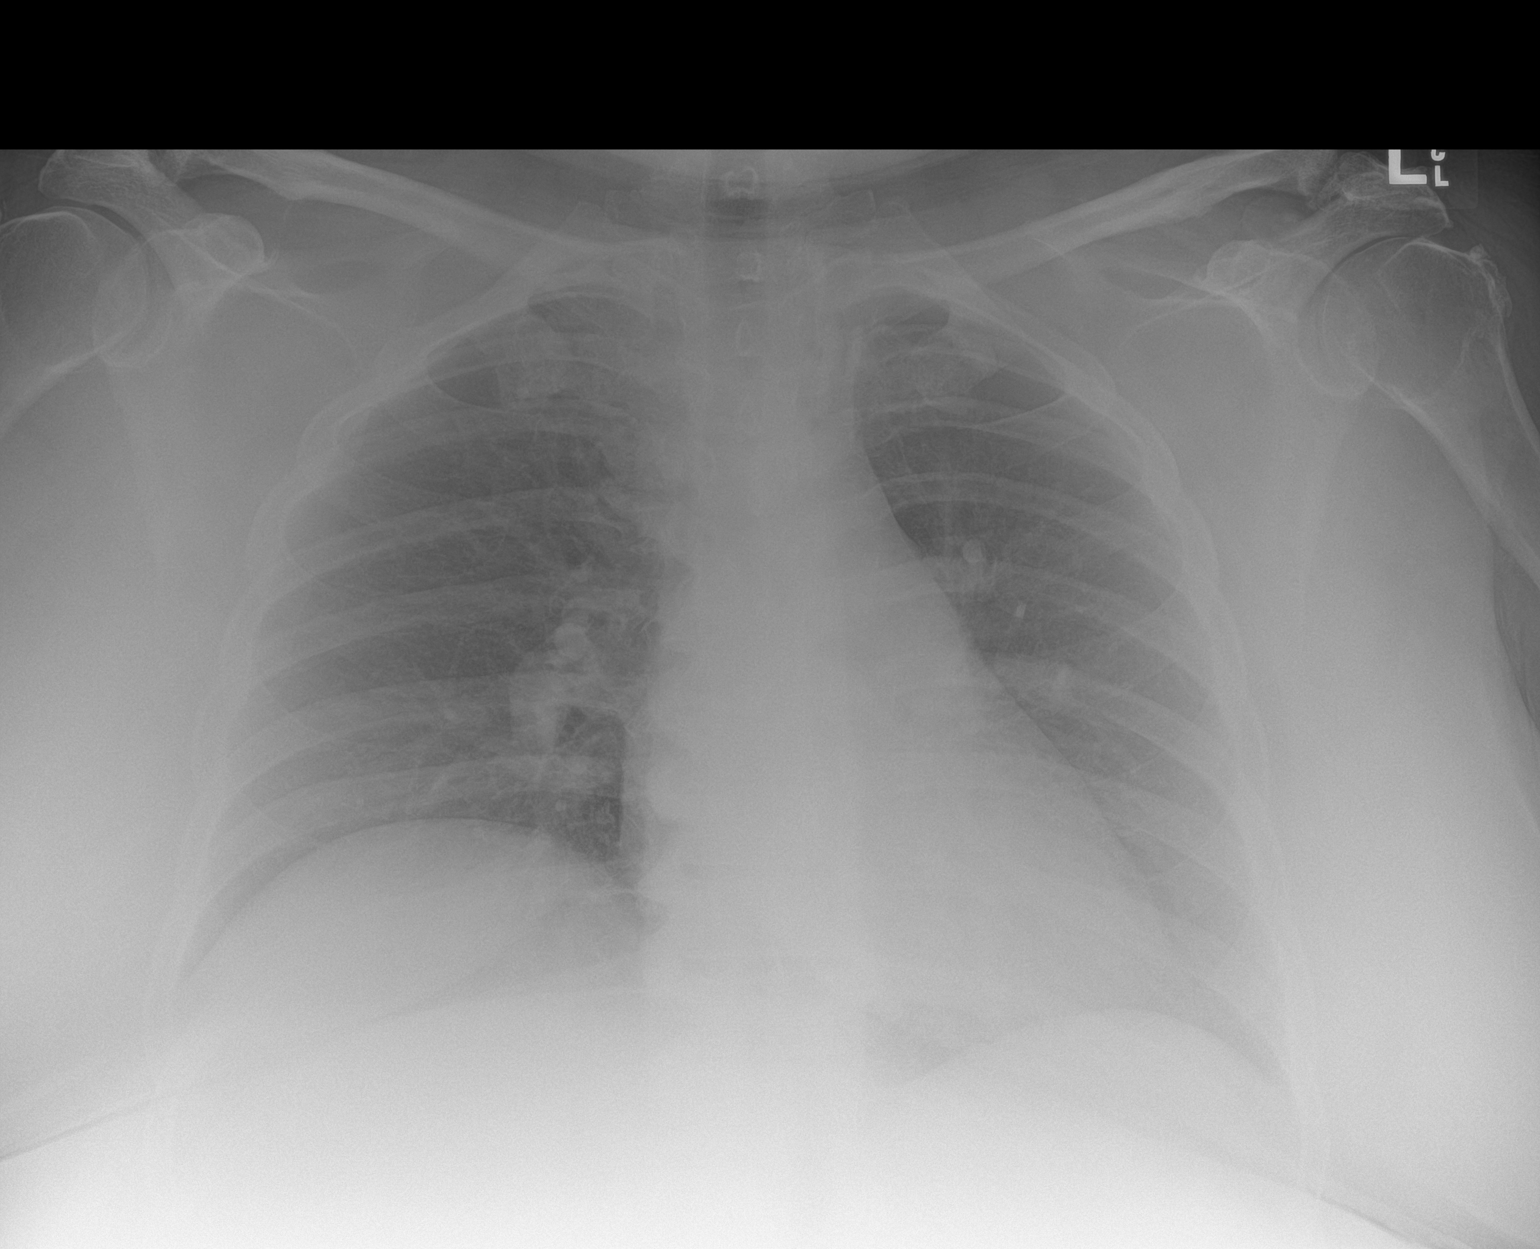

[1 of 1 positions shown; findings below may reference images not displayed]

FINDINGS: Cardiac shadow is within normal limits. The lungs are well aerated
bilaterally. Elevation of the right hemidiaphragm is again seen. No
focal infiltrate is noted. No acute bony abnormality is seen.
IMPRESSION: No active cardiopulmonary disease.

## 2021-05-28 ENCOUNTER — Ambulatory Visit (INDEPENDENT_AMBULATORY_CARE_PROVIDER_SITE_OTHER): Payer: Medicaid Other | Admitting: Family Medicine

## 2021-05-28 ENCOUNTER — Other Ambulatory Visit: Payer: Self-pay

## 2021-05-28 VITALS — BP 130/88 | HR 85 | Wt 362.0 lb

## 2021-05-28 DIAGNOSIS — Z23 Encounter for immunization: Secondary | ICD-10-CM

## 2021-05-28 DIAGNOSIS — E1165 Type 2 diabetes mellitus with hyperglycemia: Secondary | ICD-10-CM | POA: Diagnosis not present

## 2021-05-28 DIAGNOSIS — G8929 Other chronic pain: Secondary | ICD-10-CM | POA: Diagnosis not present

## 2021-05-28 DIAGNOSIS — G894 Chronic pain syndrome: Secondary | ICD-10-CM | POA: Diagnosis not present

## 2021-05-28 DIAGNOSIS — J9611 Chronic respiratory failure with hypoxia: Secondary | ICD-10-CM

## 2021-05-28 MED ORDER — HYDROCODONE-ACETAMINOPHEN 10-325 MG PO TABS: 1.0000 | ORAL_TABLET | Freq: Four times a day (QID) | ORAL | 0 refills | Status: DC | PRN

## 2021-05-28 MED ORDER — RAMELTEON 8 MG PO TABS: 8.0000 mg | ORAL_TABLET | Freq: Every day | ORAL | 1 refills | Status: DC

## 2021-05-28 NOTE — Patient Instructions (Addendum)
Referring to pain management Sent in norco with 100 tablets per month - can take extra pill 10 days per month. Use caution, make sure it does not make you too tired or have trouble breathing  Sent in melatonin-like medication to help with sleep    Call [place]: [phone]

## 2021-05-28 NOTE — Progress Notes (Signed)
  Date of Visit: 05/28/2021   SUBJECTIVE:   HPI:  [name] presents today for follow up.  Chronic hypoxic resp failure - needs portable oxygen concentrator as her current tank runs out when she is out of the house for more than a few hours. Uses anywhere from 2L to 3L at home. Sats at home have been under 90 on occasion, so she's gone up to 3L. Had hypoxia documented at ED visit on 5/23, with O2 sat 84% on RA at rest, improved to >90 on 2L O2.  Sleep trouble -  requests medication to help her sleep. Was given rx previously for hydroxyzine, but this gave her some GI upset so she stopped taking it. Would be interested in melatonin. Has upcoming appointment with therapist on Monday. She is compliant with her bipap at night.  Chronic pain - taking norco 10-325mg  3 times a day for her pain. Has had more pain lately as she's been walking more, exercising 4 times a day. Physical therapy has caused her to hurt more as well. She requests increase in # of tablets and also referral to pain management, prefers [facility]  FMLA - needs FMLA papers completed for her mother, who is her caretaker.  Diabetes - has upcoming appointment with endocrinology through [facility] in [place] in September. Taking tresiba 90u twice daily and novolog 50u three times daily with meals. Still having frequently elevated blood sugars. No recent lows.   OBJECTIVE:   BP 130/88   Pulse 85   Wt (!) 362 lb (164.2 kg)   SpO2 97% Comment: 3L [place]  BMI 66.21 kg/m  Gen: no acute distress, pleasant cooperative HEENT: normocephalic, atraumatic  Lungs: normal work of breathing, clear to auscultation bilaterally  Neuro: alert, speech normal grossly nonfocal Psych: normal range of affect, well groomed, speech normal in rate and volume, normal eye contact   ASSESSMENT/PLAN:   Health maintenance:  -has appointment with [facility] in August, last exam within the last year there -PCV 20 given today  Diabetes Remains  uncontrolled but fortunately has upcoming appointment in several months with new endocrinologist. I think she would benefit from working more intensively with our pharmacy team and have scheduled her to see [name] on 7/5. In the mean time will increase tresiba to 95u twice daily.  Difficulty sleeping Will rx ramelteon. Discussed risks of respiratory depression with any sleep aide given her severe OSA/chronic hypoxic resp failure. She accepts these risks and would like to try ramelteon.  Encounter for chronic pain management PDMP reviewed with appropriate findings Refer to [facility] per patient request In meantime will cautiously increase # of tabs to 100 per month, so that she can take up to 4 tabs a day ten days out of the month. Benefits of continued opioid therapy outweigh risks Counseled on using caution with other sedating medications (specifically ramelteon which is a new rx today).  Chronic hypoxic respiratory failure Continues to need oxygen, between 2 and 3L during the day. Portable oxygen concentrator ordered today  FMLA completed for her mother, copy scanned into chart  FOLLOW UP: Follow up in 1 month with me for above issues Scheduled 7/5 with [name] Referring to pain management.  [place]. [name], [name]

## 2021-06-01 ENCOUNTER — Telehealth: Payer: Self-pay

## 2021-06-01 DIAGNOSIS — Z7409 Other reduced mobility: Secondary | ICD-10-CM

## 2021-06-01 MED ORDER — SURE COMFORT PEN NEEDLES 32G X 4 MM MISC: 12 refills | Status: DC

## 2021-06-01 NOTE — Telephone Encounter (Signed)
Community message send to [employer] for bariatric BSC and POC. Also send follow up message to check status of previous DME orders for lift chair and hoyer lift. Will await response.   Talbot Grumbling, RN

## 2021-06-01 NOTE — Telephone Encounter (Signed)
Patient calls nurse line requesting urgent refill of pen needles. Patient reports that she is out of pen needles and cannot take insulin until refill is sent in. Last OV 05/28/2021. Sent in per protocol.   Patient is also requesting a new DME BSC as current BSC broke over the weekend. Please place order for bariatric [facility] for patient and route back to RN team once completed.   Talbot Grumbling, RN

## 2021-06-01 NOTE — Telephone Encounter (Signed)
BSC ordered, will route back to RN team [name], MD

## 2021-06-03 ENCOUNTER — Telehealth: Payer: Self-pay | Admitting: Licensed Clinical Social Worker

## 2021-06-03 NOTE — Chronic Care Management (AMB) (Signed)
    Clinical Social Work  Care Management   Phone Outreach    06/03/2021 Name: Tanya Crothers MRN: [id number] DOB: June 14, 1976  Khrystyne Arpin is a 45 y.o. year old female who is a primary care patient of [name], MD .   F/U phone call today to assess needs, and progress with care plan goals.   Telephone outreach was unsuccessful.  A HIPPA compliant phone message was left for the patient providing contact information and requesting a return call.   Plan:CCM LCSW will wait for return call. If no return call is received, Will route chart to Care Guide to see if patient would like to reschedule phone appointment   Review of patient status, including review of consultants reports, relevant laboratory and other test results, and collaboration with appropriate care team members and the patient's provider was performed as part of comprehensive patient evaluation and provision of care management services.    Casimer Lanius, [place] / [place]   [phone] 2:34 PM

## 2021-06-04 ENCOUNTER — Encounter (HOSPITAL_BASED_OUTPATIENT_CLINIC_OR_DEPARTMENT_OTHER): Payer: Medicaid Other | Admitting: Internal Medicine

## 2021-06-05 ENCOUNTER — Encounter: Payer: Self-pay | Admitting: Physical Medicine and Rehabilitation

## 2021-06-09 ENCOUNTER — Ambulatory Visit: Payer: Medicaid Other | Admitting: Pharmacist

## 2021-06-09 ENCOUNTER — Encounter: Payer: Self-pay | Admitting: Family Medicine

## 2021-06-10 ENCOUNTER — Other Ambulatory Visit: Payer: Self-pay

## 2021-06-10 ENCOUNTER — Encounter (HOSPITAL_COMMUNITY): Payer: Self-pay

## 2021-06-10 ENCOUNTER — Emergency Department (HOSPITAL_COMMUNITY)
Admission: EM | Admit: 2021-06-10 | Discharge: 2021-06-10 | Disposition: A | Discharge status: Home / Self Care | Payer: Medicaid Other | Attending: Emergency Medicine | Admitting: Emergency Medicine

## 2021-06-10 DIAGNOSIS — R0602 Shortness of breath: Secondary | ICD-10-CM | POA: Diagnosis not present

## 2021-06-10 DIAGNOSIS — Z79899 Other long term (current) drug therapy: Secondary | ICD-10-CM | POA: Insufficient documentation

## 2021-06-10 DIAGNOSIS — I10 Essential (primary) hypertension: Secondary | ICD-10-CM | POA: Insufficient documentation

## 2021-06-10 DIAGNOSIS — M25562 Pain in left knee: Secondary | ICD-10-CM | POA: Insufficient documentation

## 2021-06-10 DIAGNOSIS — Z7982 Long term (current) use of aspirin: Secondary | ICD-10-CM | POA: Diagnosis not present

## 2021-06-10 DIAGNOSIS — Z8616 Personal history of COVID-19: Secondary | ICD-10-CM | POA: Insufficient documentation

## 2021-06-10 DIAGNOSIS — J449 Chronic obstructive pulmonary disease, unspecified: Secondary | ICD-10-CM | POA: Insufficient documentation

## 2021-06-10 DIAGNOSIS — M25561 Pain in right knee: Secondary | ICD-10-CM | POA: Diagnosis not present

## 2021-06-10 DIAGNOSIS — Z7951 Long term (current) use of inhaled steroids: Secondary | ICD-10-CM | POA: Diagnosis not present

## 2021-06-10 DIAGNOSIS — E1140 Type 2 diabetes mellitus with diabetic neuropathy, unspecified: Secondary | ICD-10-CM | POA: Insufficient documentation

## 2021-06-10 DIAGNOSIS — Z794 Long term (current) use of insulin: Secondary | ICD-10-CM | POA: Insufficient documentation

## 2021-06-10 DIAGNOSIS — Z87891 Personal history of nicotine dependence: Secondary | ICD-10-CM | POA: Diagnosis not present

## 2021-06-10 DIAGNOSIS — J45909 Unspecified asthma, uncomplicated: Secondary | ICD-10-CM | POA: Insufficient documentation

## 2021-06-10 LAB — CBG MONITORING, ED
Glucose-Capillary: 468 mg/dL — ABNORMAL HIGH (ref 70–99)
Glucose-Capillary: 359 mg/dL — ABNORMAL HIGH (ref 70–99)

## 2021-06-10 MED ORDER — HYDROMORPHONE HCL 1 MG/ML IJ SOLN
1.0000 mg | Freq: Once | INTRAMUSCULAR | Status: DC
  Filled 2021-06-10: qty 1

## 2021-06-10 MED ORDER — INSULIN ASPART 100 UNIT/ML IJ SOLN
5.0000 [IU] | Freq: Once | INTRAMUSCULAR | Status: AC
  Administered 2021-06-10: 5 [IU] via SUBCUTANEOUS
  Filled 2021-06-10: qty 0.05

## 2021-06-10 MED ORDER — PROMETHAZINE HCL 25 MG PO TABS: 25.0000 mg | ORAL_TABLET | Freq: Once | ORAL | Status: DC

## 2021-06-10 MED ORDER — HYDROMORPHONE HCL 1 MG/ML IJ SOLN
1.0000 mg | Freq: Once | INTRAMUSCULAR | Status: AC
  Administered 2021-06-10: 1 mg via INTRAMUSCULAR

## 2021-06-10 MED ORDER — PROMETHAZINE HCL 25 MG/ML IJ SOLN
25.0000 mg | Freq: Four times a day (QID) | INTRAMUSCULAR | Status: DC | PRN
  Administered 2021-06-10: 25 mg via INTRAMUSCULAR
  Filled 2021-06-10: qty 1

## 2021-06-10 MED ORDER — PROMETHAZINE HCL 25 MG PO TABS
25.0000 mg | ORAL_TABLET | Freq: Once | ORAL | Status: AC
  Administered 2021-06-10: 25 mg via ORAL
  Filled 2021-06-10: qty 1

## 2021-06-10 MED ORDER — HYDROMORPHONE HCL 1 MG/ML IJ SOLN
1.0000 mg | Freq: Once | INTRAMUSCULAR | Status: AC
  Administered 2021-06-10: 1 mg via INTRAMUSCULAR
  Filled 2021-06-10: qty 1

## 2021-06-10 NOTE — ED Triage Notes (Addendum)
Patient arrives via EMS with complaint of SOB and pain below the the knees BLE.  Patient reports taking her pain medicine and insulin at 2200 and waking up at 0045 SOB and still in pain.    EMS vitals:  BP 114/60 SPO2 96% RA

## 2021-06-10 NOTE — ED Provider Notes (Signed)
[facility] DEPT Provider Note   CSN: [id number] Arrival date & time: 06/10/21  0121     History Chief Complaint  Patient presents with   Knee Pain    Evgenia Merriman is a 45 y.o. female with a history of insulin-dependent diabetes and previous osteomyelitis presents to the emergency department with bilateral lower leg pain.  This started earlier in the evening.  She reports taking Vicodin and gabapentin at 10pm without relief.  Patient denies falls, trauma or other injuries to her legs.  Denies fever, chills, nausea, vomiting.  Patient reports that to get comfortable she tried sleeping on her back and this made her feel short of breath.  Patient reports that she always feels short of breath when she lies on her back.  She reports that when she rolled back over on her side her shortness of breath resolved completely and she no longer feels short of breath.  No specific aggravating or alleviating factors for her leg pain.  Narcotic database reviewed: Prescription for Vicodin, 90 tablets filled on 05/12/2021.  The history is provided by the patient and medical records. No language interpreter was used.      Past Medical History:  Diagnosis Date   Acute osteomyelitis of ankle or foot, left ([place]) 01/31/2018   Alveolar hypoventilation    Anemia    not on iron pill   Asthma    Bipolar 2 disorder ([name])    Carpal tunnel syndrome on right    recurrent   Cellulitis 08/2010-08/2011   Chronic pain    COPD (chronic obstructive pulmonary disease) ([name])    Symbicort daily and Proventil as needed   Costochondritis    Diabetes mellitus type II, uncontrolled ([place]) 2000   Type 2, Uncontrolled.Takes Lantus daily.Fasting blood sugar runs 150   Drug-seeking behavior    GERD (gastroesophageal reflux disease)    takes Pantoprazole and Zantac daily   HLD (hyperlipidemia)    takes Atorvastatin daily   Hypertension    takes Lisinopril and Coreg daily   Morbid obesity ([name])     Nocturia    OSA on CPAP    Peripheral neuropathy    takes Gabapentin daily   Pneumonia    "walking" several yrs ago and as a baby (12/05/2018)   Rectal fissure    Restless leg    SVT (supraventricular tachycardia) ([name])    Syncope 02/25/2016   Urinary frequency    Urinary incontinence 10/23/2020   Varicose veins    Right medial thigh and Left leg     Patient Active Problem List   Diagnosis Date Noted   Chest pain with low risk for cardiac etiology 05/11/2021   Morbid obesity with BMI of 60.0-69.9, adult ([place]) 04/15/2021   Hypoxia 04/15/2021   UTI (urinary tract infection) 04/15/2021   Chronic pain 04/15/2021   Pneumonia due to COVID-19 virus 04/14/2021   Urinary incontinence 10/23/2020   Elevated troponin    Acute respiratory failure with hypoxia and hypercarbia ([name]) 06/15/2020   Chronic respiratory failure with hypoxia ([place]) 06/14/2020   Blister of foot 04/21/2020   Exposure to mold 04/21/2020   Constipation 04/03/2020   Left shoulder pain 06/23/2019   Dysuria 06/14/2019   Hyperglycemia due to diabetes mellitus ([place])    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   Allergic rhinitis 03/06/2018   Class 3 severe obesity due to excess calories with serious comorbidity and body mass index (BMI) of 50.0 to 59.9 in adult ([name])    Decreased pedal  pulses 06/10/2017   Unilateral primary osteoarthritis, right knee 10/22/2016   Primary osteoarthritis of first carpometacarpal joint of left hand 07/30/2016   Diabetic neuropathy ([place]) 07/14/2016   Nausea 03/17/2016   De Quervain's tenosynovitis, bilateral 11/01/2015   Vitamin D deficiency 09/05/2015   Recurrent candidiasis of vagina 09/05/2015   Varicose veins of leg with complications 97/35/3299   Restless leg syndrome 10/17/2014   Chronic sinusitis 07/18/2014   Encounter for chronic pain management 06/30/2013   HLD (hyperlipidemia) 11/19/2012   Angina pectoris ([place]) 06/27/2012   Abscess of skin and subcutaneous tissue 11/04/2011    Right carpal tunnel syndrome 09/01/2011   Bilateral knee pain 09/01/2011   DM (diabetes mellitus) type II uncontrolled, periph vascular disorder ([place]) 05/22/2008   Morbid obesity ([place]) 05/22/2008   Obesity hypoventilation syndrome ([place]) 05/22/2008   Mood disorder ([place]) 05/22/2008   Obstructive sleep apnea 05/22/2008   Hypertension 05/22/2008   Asthma 05/22/2008   GERD 05/22/2008    Past Surgical History:  Procedure Laterality Date   AMPUTATION Left 02/01/2018   Procedure: LEFT FOURTH AND 5TH TOE RAY AMPUTATION;  Surgeon: [name], MD;  Location: [facility];  Service: Orthopedics;  Laterality: Left;   AMPUTATION Left 03/03/2018   Procedure: LEFT BELOW KNEE AMPUTATION;  Surgeon: [name], MD;  Location: [facility];  Service: Orthopedics;  Laterality: Left;   CARPAL TUNNEL RELEASE Bilateral    CESAREAN SECTION  2007   CORONARY ANGIOGRAPHY N/A 11/26/2020   Procedure: CORONARY ANGIOGRAPHY;  Surgeon: Sherren Mocha, MD;  Location: [facility];  Service: Cardiovascular;  Laterality: N/A;   INCISION AND DRAINAGE PERIRECTAL ABSCESS Left 05/18/2019   Procedure: IRRIGATION AND DEBRIDEMENT OF PANNIS ABSCESS, POSSIBLE DEBRIDEMENT OF BUTTOCK WOUND;  Surgeon: Donnie Mesa, MD;  Location: [facility];  Service: General;  Laterality: Left;   IRRIGATION AND DEBRIDEMENT BUTTOCKS Left 05/17/2019   Procedure: IRRIGATION AND DEBRIDEMENT BUTTOCKS;  Surgeon: Donnie Mesa, MD;  Location: [facility];  Service: General;  Laterality: Left;   KNEE ARTHROSCOPY Right 07/17/2010   LEFT HEART CATHETERIZATION WITH CORONARY ANGIOGRAM N/A 07/27/2012   Procedure: LEFT HEART CATHETERIZATION WITH CORONARY ANGIOGRAM;  Surgeon: Sherren Mocha, MD;  Location: [facility];  Service: Cardiovascular;  Laterality: N/A;   MASS EXCISION N/A 06/29/2013   Procedure:  WIDE LOCAL EXCISION OF POSTERIOR NECK ABSCESS;  Surgeon: Ralene Ok, MD;  Location: [facility];  Service: General;  Laterality: N/A;   REPAIR KNEE LIGAMENT Left    "fixed  ligaments and chipped patella"   right transmetatarsal amputation        OB History     Gravida  2   Para  1   Term      Preterm      AB  1   Living  1      SAB  1   IAB      Ectopic      Multiple      Live Births              Family History  Problem Relation Age of Onset   Diabetes Mother    Hyperlipidemia Mother    Depression Mother    GER disease Mother    Allergic rhinitis Mother    Restless legs syndrome Mother    Heart attack Paternal Uncle    Heart disease Paternal Grandmother    Heart attack Paternal Grandmother    Heart attack Paternal Grandfather    Heart disease Paternal Grandfather  Heart attack Father    Migraines Sister    Cancer Maternal Grandmother        COLON   Hypertension Maternal Grandmother    Hyperlipidemia Maternal Grandmother    Diabetes Maternal Grandmother    Other Maternal Grandfather        GUN SHOT   Anxiety disorder Sister    Asthma Child     Social History   Tobacco Use   Smoking status: Former    Packs/day: 0.25    Years: 15.00    Pack years: 3.75    Types: Cigarettes    Quit date: 12/06/2005    Years since quitting: 15.5   Smokeless tobacco: Never   Tobacco comments:    smokes for a couple of months  Vaping Use   Vaping Use: Never used  Substance Use Topics   Alcohol use: No    Alcohol/week: 0.0 standard drinks   Drug use: Not Currently    Comment: OD attempts on home meds      Home Medications Prior to Admission medications   Medication Sig Start Date End Date Taking? Authorizing Provider  Accu-Chek FastClix Lancets MISC USE DAILY AS INSTRUCTED 05/18/21   [name], MD  Accu-Chek Softclix Lancets lancets Use to check blood glucose four times daily 04/17/20   Martyn Malay, MD  albuterol (VENTOLIN HFA) 108 (90 Base) MCG/ACT inhaler Inhale 1-2 puffs into the lungs every 6 (six) hours as needed for wheezing or shortness of breath.    [provider]  amLODipine (NORVASC) 10 MG  tablet Take 1 tablet by mouth daily. 12/28/18   [provider]  aspirin EC 81 MG tablet Take 1 tablet (81 mg total) by mouth daily. Swallow whole. 11/19/20   Sherren Mocha, MD  atorvastatin (LIPITOR) 80 MG tablet Take 1 tablet (80 mg total) by mouth daily. 02/11/21   [name], MD  budesonide-formoterol [facility]) 160-4.5 MCG/ACT inhaler Inhale 2 puffs into the lungs 2 (two) times daily. 05/09/20   [name], MD  Cholecalciferol 1000 units tablet Take 1 tablet (1,000 Units total) by mouth daily. 04/05/17   [name], MD  Continuous Blood Gluc Sensor (DEXCOM G6 SENSOR) MISC Inject 1 applicator into the skin as directed. Change sensor every 10 days. 10/16/20   Leavy Cella, RPH-CPP  Continuous Blood Gluc Transmit (DEXCOM G6 TRANSMITTER) MISC Inject 1 Device into the skin as directed. Reuse 8 times with sensor changes. 10/16/20   Leavy Cella, RPH-CPP  Ferrous Sulfate (IRON) 325 (65 Fe) MG TABS Take 1 tablet (325 mg total) by mouth every other day. 03/31/21   [name], MD  gabapentin (NEURONTIN) 600 MG tablet TAKE 2 TABLETS BY MOUTH 3 TIMES DAILY Patient taking differently: Take 1,200 mg by mouth 3 (three) times daily. 03/23/21   [name], MD  HYDROcodone-acetaminophen [facility]) 10-325 MG tablet Take 1 tablet by mouth every 6 (six) hours as needed for moderate pain. 05/28/21   [name], MD  insulin aspart (NOVOLOG) 100 UNIT/ML FlexPen Inject 40 Units into the skin 3 (three) times daily with meals. Patient taking differently: Inject 50 Units into the skin 3 (three) times daily with meals. 02/10/21   [name], MD  insulin degludec (TRESIBA FLEXTOUCH) 200 UNIT/ML FlexTouch Pen Inject 110 Units into the skin 2 (two) times daily. Patient taking differently: Inject 90 Units into the skin 2 (two) times daily. 03/25/21   [name], MD  Insulin Pen Needle (  SURE COMFORT PEN NEEDLES) 32G X 4 MM MISC Use to inject insulin  as indicated 06/01/21   [name], MD  metoprolol succinate (TOPROL-XL) 25 MG 24 hr tablet Take 25 mg by mouth daily. 10/23/20   [provider]  Misc. Devices (TRANSFER BENCH) MISC Use daily 10/29/20   [name], MD  omeprazole (PRILOSEC) 40 MG capsule Take 1 capsule (40 mg total) by mouth daily. 11/14/20   Martyn Ehrich, NP  potassium chloride SA (KLOR-CON M20) 20 MEQ tablet Take 1 tablet (20 mEq total) by mouth daily. 09/09/20   Richardson Dopp T, PA-C  ramelteon (ROZEREM) 8 MG tablet Take 1 tablet (8 mg total) by mouth at bedtime. 05/28/21   [name], MD  rOPINIRole (REQUIP) 1 MG tablet Take 1 tablet (1 mg total) by mouth at bedtime. 08/12/20   [name], MD  Tiotropium Bromide Monohydrate (SPIRIVA RESPIMAT) 1.25 MCG/ACT AERS Inhale 2 puffs into the lungs daily. 11/14/20   Martyn Ehrich, NP  torsemide (DEMADEX) 20 MG tablet TAKE 4 TABLETS (80 MG) BY MOUTH ONCE A DAY Patient taking differently: Take 80 mg by mouth daily. 02/27/21   Lind Covert, MD  TRULICITY 1.5 CH/8.8FO SOPN inject 1.5mg  into THE SKIN ONCE A WEEK Patient taking differently: Inject 1.75 mg as directed once a week. 05/01/21   [name], MD    Allergies    Cefepime, Kiwi extract, Morphine and related, Nalbuphine, Trental [pentoxifylline], Toradol [ketorolac tromethamine], Morphine, and Nubain [nalbuphine hcl]  Review of Systems   Review of Systems  Constitutional:  Negative for appetite change, diaphoresis, fatigue, fever and unexpected weight change.  HENT:  Negative for mouth sores.   Eyes:  Negative for visual disturbance.  Respiratory:  Negative for cough, chest tightness, shortness of breath and wheezing.   Cardiovascular:  Negative for chest pain.  Gastrointestinal:  Negative for abdominal pain, constipation, diarrhea, nausea and vomiting.  Endocrine: Negative for polydipsia, polyphagia and polyuria.  Genitourinary:  Negative for dysuria,  frequency, hematuria and urgency.  Musculoskeletal:  Positive for arthralgias. Negative for back pain and neck stiffness.  Skin:  Negative for rash.  Allergic/Immunologic: Negative for immunocompromised state.  Neurological:  Negative for syncope, light-headedness and headaches.  Hematological:  Does not bruise/bleed easily.  Psychiatric/Behavioral:  Negative for sleep disturbance. The patient is not nervous/anxious.    Physical Exam Updated Vital Signs BP 112/67   Pulse 85   Temp 97.8 F (36.6 C) (Oral)   Resp 17   Ht 5\' 2"  (1.575 m)   Wt (!) 164.2 kg   SpO2 93%   BMI 66.21 kg/m   Physical Exam Vitals and nursing note reviewed.  Constitutional:      General: She is not in acute distress.    Appearance: She is well-developed. She is not ill-appearing.  HENT:     Head: Normocephalic.  Eyes:     General: No scleral icterus.    Conjunctiva/sclera: Conjunctivae normal.  Cardiovascular:     Rate and Rhythm: Normal rate.  Pulmonary:     Effort: Pulmonary effort is normal.     Breath sounds: Normal breath sounds.  Abdominal:     General: There is no distension.     Tenderness: There is no abdominal tenderness.     Comments: Limited due to body habitus  Musculoskeletal:        General: Normal range of motion.     Cervical back: Normal range of motion.  Comments: Left BKA -well-healed incisions, no open wounds, no erythema, no swelling, no induration, no tenderness to palpation of the left lower extremity, full range of motion of the left knee and left hip. Right forefoot amputation -well-healed incisions, no open wounds, no erythema, no swelling, no induration, no tenderness to palpation of the right lower extremity.  Full range of motion of the right ankle, right knee and right hip.  Skin:    General: Skin is warm and dry.  Neurological:     Mental Status: She is alert.  Psychiatric:        Mood and Affect: Mood normal.    ED Results / Procedures / Treatments    Labs (all labs ordered are listed, but only abnormal results are displayed) Labs Reviewed  CBG MONITORING, ED - Abnormal; Notable for the following components:      Result Value   Glucose-Capillary 468 (*)    All other components within normal limits  CBG MONITORING, ED - Abnormal; Notable for the following components:   Glucose-Capillary 359 (*)    All other components within normal limits      Procedures Procedures   Medications Ordered in ED Medications  promethazine (PHENERGAN) injection 25 mg (25 mg Intramuscular Given 06/10/21 0542)  HYDROmorphone (DILAUDID) injection 1 mg (1 mg Intramuscular Given 06/10/21 0426)  promethazine (PHENERGAN) tablet 25 mg (25 mg Oral Given 06/10/21 0425)  insulin aspart (novoLOG) injection 5 Units (5 Units Subcutaneous Given 06/10/21 0538)  HYDROmorphone (DILAUDID) injection 1 mg (1 mg Intramuscular Given 06/10/21 0545)    ED Course  I have reviewed the triage vital signs and the nursing notes.  Pertinent labs & imaging results that were available during my care of the patient were reviewed by me and considered in my medical decision making (see chart for details).    MDM Rules/Calculators/A&P                          Patient presents with complaints of bilateral lower leg pain.  She is a history of amputation.  She is well-appearing, afebrile without tachycardia.  Physical exam of the legs is reassuring.  There is no open wounds, erythema, induration to suggest cellulitis or infection.  There is no focal tenderness on exam.  In fact there is no tenderness at all on exam.  She is full range of motion of both legs.  Denies trauma.  At this time no imaging is indicated.  Will give pain control.  Patient's blood sugar checked in triage.  Noted to be 468.  She has a history of uncontrolled diabetes and previous blood sugars have ranged in the 200-300s.  She denies symptoms of hyperglycemia including polyuria, polydipsia, abdominal pain, nausea or vomiting.   Patient given insulin here in the emergency department with improvement in blood sugar to 359.  She has no history of DKA.  She continues to feel well and look well.    Discussed close follow-up with orthopedist and primary care physician.  Also discussed the importance of medication compliance and diet management for blood sugar control.  Discussed reasons to return immediately to the emergency department.  Final Clinical Impression(s) / ED Diagnoses Final diagnoses:  Acute pain of both knees    Rx / DC Orders ED Discharge Orders     None        Naarah Borgerding, Gwenlyn Perking 06/10/21 0649    Molpus, Jenny Reichmann, MD 06/10/21 [phone]

## 2021-06-10 NOTE — ED Notes (Signed)
Patient noted to be screaming in room.

## 2021-06-10 NOTE — Discharge Instructions (Addendum)
1. Medications: usual home medications 2. Treatment: rest, drink plenty of fluids,  3. Follow Up: Please followup with your primary doctor in 2-3 days for discussion of your diagnoses and further evaluation after today's visit; if you do not have a primary care doctor use the resource guide provided to find one; Please return to the ER for new or worsening symptoms  

## 2021-06-10 NOTE — ED Notes (Signed)
[facility] contacted, transport arranged for patient.

## 2021-06-11 ENCOUNTER — Telehealth: Payer: Self-pay | Admitting: *Deleted

## 2021-06-11 NOTE — Telephone Encounter (Signed)
Transition Care Management Unsuccessful Follow-up Telephone Call  Date of discharge and from where:  06/10/2021 - [name]  Attempts:  1st Attempt  Reason for unsuccessful TCM follow-up call:  Unable to reach patient

## 2021-06-12 NOTE — Telephone Encounter (Signed)
Transition Care Management Unsuccessful Follow-up Telephone Call  Date of discharge and from where:  06/10/2021- [name]  Attempts:  2nd Attempt  Reason for unsuccessful TCM follow-up call:  Left voice message

## 2021-06-13 ENCOUNTER — Other Ambulatory Visit: Payer: Self-pay | Admitting: Family Medicine

## 2021-06-15 NOTE — Telephone Encounter (Signed)
Transition Care Management Unsuccessful Follow-up Telephone Call  Date of discharge and from where:  06/10/2021 from [facility]  Attempts:  3rd Attempt  Reason for unsuccessful TCM follow-up call:  Unable to reach patient

## 2021-06-16 ENCOUNTER — Other Ambulatory Visit: Payer: Self-pay

## 2021-06-16 ENCOUNTER — Encounter: Payer: Self-pay | Admitting: Family Medicine

## 2021-06-16 ENCOUNTER — Ambulatory Visit: Payer: Medicaid Other | Admitting: Family Medicine

## 2021-06-16 ENCOUNTER — Ambulatory Visit: Payer: Medicaid Other | Attending: Family Medicine | Admitting: Physical Therapy

## 2021-06-16 DIAGNOSIS — M21371 Foot drop, right foot: Secondary | ICD-10-CM | POA: Diagnosis present

## 2021-06-16 DIAGNOSIS — R293 Abnormal posture: Secondary | ICD-10-CM | POA: Diagnosis present

## 2021-06-16 DIAGNOSIS — M79605 Pain in left leg: Secondary | ICD-10-CM | POA: Diagnosis present

## 2021-06-16 DIAGNOSIS — R2681 Unsteadiness on feet: Secondary | ICD-10-CM

## 2021-06-16 DIAGNOSIS — R296 Repeated falls: Secondary | ICD-10-CM | POA: Diagnosis present

## 2021-06-16 DIAGNOSIS — M6281 Muscle weakness (generalized): Secondary | ICD-10-CM

## 2021-06-16 DIAGNOSIS — R2689 Other abnormalities of gait and mobility: Secondary | ICD-10-CM

## 2021-06-16 DIAGNOSIS — M79604 Pain in right leg: Secondary | ICD-10-CM | POA: Diagnosis present

## 2021-06-17 NOTE — Therapy (Addendum)
[facility] [address] [place], [place], [address] Phone: [phone]   Fax:  [phone]  Physical Therapy Evaluation  Patient Details  Name: [name] MRN: [id number] Date of Birth: 05-21-1976 Referring Provider (PT): [name], MD   Encounter Date: 06/16/2021   PT End of Session - 06/17/21 2014     Visit Number 1    Number of Visits 1    Date for PT Re-Evaluation 06/16/21    Authorization Type Traditional Medicaid    PT Start Time 1315    PT Stop Time 1430    PT Time Calculation (min) 75 min    Activity Tolerance Patient limited by pain    Behavior During Therapy [facility] for tasks assessed/performed             Past Medical History:  Diagnosis Date   Acute osteomyelitis of ankle or foot, left ([place]) 01/31/2018   Alveolar hypoventilation    Anemia    not on iron pill   Asthma    Bipolar 2 disorder ([place])    Carpal tunnel syndrome on right    recurrent   Cellulitis 08/2010-08/2011   Chronic pain    COPD (chronic obstructive pulmonary disease) ([place])    Symbicort daily and Proventil as needed   Costochondritis    Diabetes mellitus type II, uncontrolled ([place]) 2000   Type 2, Uncontrolled.Takes Lantus daily.Fasting blood sugar runs 150   Drug-seeking behavior    GERD (gastroesophageal reflux disease)    takes Pantoprazole and Zantac daily   HLD (hyperlipidemia)    takes Atorvastatin daily   Hypertension    takes Lisinopril and Coreg daily   Morbid obesity ([name])    Nocturia    OSA on CPAP    Peripheral neuropathy    takes Gabapentin daily   Pneumonia    "walking" several yrs ago and as a baby (12/05/2018)   Rectal fissure    Restless leg    SVT (supraventricular tachycardia) ([name])    Syncope 02/25/2016   Urinary frequency    Urinary incontinence 10/23/2020   Varicose veins    Right medial thigh and Left leg     Past Surgical History:  Procedure Laterality Date   AMPUTATION Left  02/01/2018   Procedure: LEFT FOURTH AND 5TH TOE RAY AMPUTATION;  Surgeon: [name], MD;  Location: [facility];  Service: Orthopedics;  Laterality: Left;   AMPUTATION Left 03/03/2018   Procedure: LEFT BELOW KNEE AMPUTATION;  Surgeon: [name], MD;  Location: [facility];  Service: Orthopedics;  Laterality: Left;   CARPAL TUNNEL RELEASE Bilateral    CESAREAN SECTION  2007   CORONARY ANGIOGRAPHY N/A 11/26/2020   Procedure: CORONARY ANGIOGRAPHY;  Surgeon: Sherren Mocha, MD;  Location: [facility];  Service: Cardiovascular;  Laterality: N/A;   INCISION AND DRAINAGE PERIRECTAL ABSCESS Left 05/18/2019   Procedure: IRRIGATION AND DEBRIDEMENT OF PANNIS ABSCESS, POSSIBLE DEBRIDEMENT OF BUTTOCK WOUND;  Surgeon: Donnie Mesa, MD;  Location: [facility];  Service: General;  Laterality: Left;   IRRIGATION AND DEBRIDEMENT BUTTOCKS Left 05/17/2019   Procedure: IRRIGATION AND DEBRIDEMENT BUTTOCKS;  Surgeon: Donnie Mesa, MD;  Location: [facility];  Service: General;  Laterality: Left;   KNEE ARTHROSCOPY Right 07/17/2010   LEFT HEART CATHETERIZATION WITH CORONARY ANGIOGRAM N/A 07/27/2012   Procedure: LEFT HEART CATHETERIZATION WITH CORONARY ANGIOGRAM;  Surgeon: Sherren Mocha, MD;  Location: [facility];  Service: Cardiovascular;  Laterality: N/A;   MASS EXCISION N/A 06/29/2013  Procedure:  WIDE LOCAL EXCISION OF POSTERIOR NECK ABSCESS;  Surgeon: Ralene Ok, MD;  Location: [facility];  Service: General;  Laterality: N/A;   REPAIR KNEE LIGAMENT Left    "fixed ligaments and chipped patella"   right transmetatarsal amputation       There were no vitals filed for this visit.    Subjective Assessment - 06/17/21 2008     Subjective Pt referred to [facility] Neuro for evaluation for power wheelchair.  Pt arrives in manual wheelchair with CNA.    Pertinent History asthma, bipolar, carpal tunnel syndrome on R, cellulitis, chronic pain, COPD, type 2 DM, HLD, HTN, morbid obesity, OSA, peripheral neuropathy, osteomyelitis,  SVT, R transmetatarsal amputation, R knee arthroscopy, L knee ligament repair, I&D of L perirectal abscess, bilat carpal tunnel release, L BKA    Patient Stated Goals to obtain a power wheelchair for more independent mobility    Currently in Pain? Yes    Pain Score 8     Pain Location Leg    Pain Orientation Right;Left    Pain Descriptors / Indicators Burning;Pins and needles    Pain Type Chronic pain                [facility] PT Assessment - 06/17/21 2010       Assessment   Medical Diagnosis L BKA, R transmetatarsal amputation; impaired mobility    Referring Provider (PT) [name], MD    Onset Date/Surgical Date 03/04/21    Hand Dominance Right    Prior Therapy outpatient Neuro after BKA for prosthetic training      Precautions   Precautions Other (comment)    Precaution Comments asthma, bipolar, carpal tunnel syndrome on R, cellulitis, chronic pain, COPD, type 2 DM, HLD, HTN, morbid obesity, OSA, peripheral neuropathy, osteomyelitis, SVT,     R transmetatarsal amputation, R knee arthroscopy, L knee ligament repair, I&D of L perirectal abscess, bilat carpal tunnel release, L BKA      Restrictions   Weight Bearing Restrictions Yes    RLE Weight Bearing Touchdown weight bearing      Balance Screen   Has the patient fallen in the past 6 months Yes    How many times? >3    Has the patient had a decrease in activity level because of a fear of falling?  Yes      Prior Function   Level of [place] Needs assistance with ADLs;Needs assistance with homemaking;Needs assistance with transfers;Needs assistance with gait              Mobility/Seating Evaluation    PATIENT INFORMATION: Name: [name] DOB: Apr 10, 1976  Sex: F Date seen: 7.12.2022 Time: 13:15  Address:  [place] [address] Physician: [name], MD This evaluation/justification form will serve as the LMN for the following  suppliers: __________________________ Supplier: [employer] Contact Person: Luz Brazen, ATP Phone:  [phone]   Seating Therapist: Misty Stanley, PT Phone:   [phone]   Phone: [phone]     Spouse/Parent/Caregiver name: Mother: [name]  Phone number: [phone] Insurance/Payer: [place]     Reason for Referral: Power mobility  Patient/Caregiver Goals: Increased independence with mobility, decrease falls  Patient was seen for face-to-face evaluation for new power wheelchair.  Also present was Aide and [employer], [employer] to discuss recommendations and wheelchair options.  Further paperwork was completed and sent to vendor.  Patient appears to qualify for power mobility device at this time per  objective findings.   MEDICAL HISTORY: Diagnosis: Primary Diagnosis: L BKA, R transmetatarsal amputation; currently has non-healing wound on R transmet amputation site Onset: 2019, 2020 Diagnosis: COPD - uses supplemental oxygen   []Progressive Disease Relevant past and future surgeries: Past surgeries: R transmetatarsal amputation, R knee arthroscopy, L knee ligament repair, I&D of L perirectal and R gluteal abscess, bilat carpal tunnel release, L BKA     Height: 5'2" Weight: 362 Explain recent changes or trends in weight: 10 lb weight fluctuations due to edema   History including Falls: 3 falls, walking bed <> wheelchair, in hospital and then fell out of bed.  Uses hoyer lift to get out of the floor if she falls.  PMH: asthma, bipolar, carpal tunnel syndrome on R, cellulitis, chronic pain, COPD, type 2 DM, HLD, HTN, morbid obesity, OSA, peripheral neuropathy, osteomyelitis, SVT    HOME ENVIRONMENT: []House  []Condo/town home  [x]Apartment  []Assisted Living    []Lives Alone [x] Lives with Others                                                                                          Hours with caregiver: 6 hours M-F, 5 hours Sunday  [x]Home is accessible to patient            Stairs      [x]Yes [] No     Ramp []Yes []No Comments:  Lives with daughter; One step on sidewalk to negotiate in chair - requires daughter's assistance to bump wheelchair up and over step, complex is working on getting a ramp.  Carpet in bedrooms, linoleum in rest of apartment.  Manual wheelchair will not fit through bathroom or bedroom door.  Bed room is open with space for turning around.  Bathroom is very small.   COMMUNITY ADL: TRANSPORTATION: [x]Car    []Van    [x]Public Transportation    []Adapted w/c Lift    []Ambulance    []Other:       [x]Sits in wheelchair during transport  Employment/School:  Specific requirements pertaining to mobility   Other: Can transfer into mother's car     FUNCTIONAL/SENSORY PROCESSING SKILLS:  Handedness:   [x]Right     []Left    []NA  Comments:    Functional Processing Skills for Wheeled Mobility [x]Processing Skills are adequate for safe wheelchair operation  Areas of concern than may interfere with safe operation of wheelchair Description of problem   [] Attention to environment      []Judgment      [] Hearing  [] Vision or visual processing      []Motor Planning  [] Fluctuations in Behavior      VERBAL COMMUNICATION: [x]WFL receptive [x] WFL expressive []Understandable  []Difficult to understand  []non-communicative [] Uses an augmented communication device  CURRENT SEATING / MOBILITY: Current Mobility Base:  []None []Dependent [x]Manual []Scooter []Power  Type of Control:   Manufacturer:  DriveSize:  22 x 18 heavy duty  Age: March 2019 - has been through 5 manual wheelchairs since 2019  Current Condition of Mobility Base:  New   Current Wheelchair components:  sling back and seat, ELR,  removable arm rests  Describe posture in present seating system:  Has to sit with increased trunk extension for back support, back rest too low, seat depth too short, LE externally rotated and ABD      SENSATION and SKIN ISSUES: Sensation []Intact   [x]Impaired []Absent  Level of sensation: neuropathy; numbness in L residual limb, R foot and lower leg Pressure Relief: Able to perform effective pressure relief :    []Yes  [x] No Method: shifting If not, Why?: Daughter has to help transfer pt out of chair and into bed for pressure relief  Skin Issues/Skin Integrity Current Skin Issues  [x]Yes [x]No []Intact [] Red area[x] Open Area  [x]Scar Tissue [x]At risk from prolonged sitting Where  Currently has non-healing wound on R transmet amputation site; scar tissue on L Perirectal and R Buttocks  History of Skin Issues  [x]Yes []No Where  L Perirectal and R Buttocks, required I&D When  June 2020  Hx of skin flap surgeries  []Yes [x]No Where   When    Limited sitting tolerance [x]Yes []No Hours spent sitting in wheelchair daily: 1-4 hours at a time  Complaint of Pain:  Please describe: bilat legs due to neuropathy and edema   Swelling/Edema: bilat LE, wears thigh high compression stocking on RLE; not pitting   ADL STATUS (in reference to wheelchair use):  Indep Assist Unable Indep with Equip Not assessed Comments  Dressing [] [x] [] [] [] aide's assistance  Eating [] [] [] [x] [] set up assistance  Toileting [] [x] [] [] [] assist with clothing, uses BSC or bed pan in living room-not able to access bathroom  Bathing [] [x] [] [] [] aide's assistance, bed level - not able to access bathroom  Grooming/Hygiene [] [x] [] [x] [] set up assistance  Meal Prep [] [x] [] [] [] can't reach up or down into cabinets in current chair  IADLS [] [x] [] [] [] requires aide's or daughter's assistance  Bowel Management: [x]Continent  []Incontinent  []Accidents Comments:    Bladder Management: [x]Continent  []Incontinent  []Accidents Comments:       WHEELCHAIR SKILLS: Manual w/c Propulsion: []UE or LE strength and endurance sufficient to participate in ADLs using manual wheelchair Arm : []left []right   []Both      Distance:  Foot:  []left []right    []Both  Operate Scooter: [] Strength, hand grip, balance and transfer appropriate for use []Living environment is accessible for use of scooter  Operate Power w/c:  [x] Std. Joystick   [] Alternative Controls Indep [x] Assist [] Dependent/unable [] N/A []  [x]Safe          [x] Functional      Distance: 1,0108f  Bed confined without wheelchair [x] Yes [] No   STRENGTH/RANGE OF MOTION:  AROM Range of Motion Strength  Shoulder L shoulder limited to 90 R shoulder 4/5 L shoulder 3/5  Elbow WFL WFL  Wrist/Hand WRiver Falls Area HsptlWFL - bilat carpal tunnel release  Hip WFL extension, flexion limited by body habitus 2/5 bilaterally  Knee WFL 3/5 extension, 2/5 flexion bilaterally  Ankle R: lacking 40 deg to neutral DF L: prosthesis R: 1/5 L: prosthesis     MOBILITY/BALANCE:  [] Patient is totally dependent for mobility      Balance Transfers Ambulation  Sitting Balance: Standing Balance: [] Independent [] Independent/Modified Independent  [x] WFL     [] [facility][] Supervision [] Supervision  [] Uses UE for balance  []  Supervision [] Min Assist [] Ambulates with Assist      [] Min Assist [] Min assist [x] Mod Assist [] Ambulates with Device:      [] RW  [] StW  [] Kasandra Knudsen  []   [] Mod Assist [x] Mod assist [] Max assist   [] Max Assist [] Max assist [] Dependent [] Indep. Short Distance Only  [] Unable [] Unable [] Lift / Sling Required Distance (in feet)     [] Sliding board [x] Unable to Ambulate (see explanation below)  Cardio Status:  []Intact  [x] Impaired   [] NA     HTN, SVT  Respiratory Status:  []Intact   [x]Impaired   []NA     COPD, RSV/COVID in May; uses supplemental 02 - 3L with exertion or when SOB; OSA: uses BiPap at night.    Orthotics/Prosthetics: L prosthesis all waking hours  Comments (Address manual vs power w/c vs scooter): [name] has mobility limitation that significantly impairs safe, timely participation in one or more mobility related ADL's.  Zsofia presents with a left  transtibial amputation and right trans-metatarsal amputation with non-healing wound and drop foot.  [name]'s deficit cannot be remediated with a cane or walker as she is non-ambulatory due to lower extremity neuropathy and numbness, right drop foot and non-healing wound on her right amputation site that requires her to be non-weight bearing.  Tedi has been fit with a prosthesis for the left leg but is only able to use her prosthesis for transfers.  Currently Kamali requires moderate assistance from her daughter or aide for transfers into and out of her manual wheelchair.  [name] is currently using a heavy-duty manual wheelchair but is unable to propel any type of manual wheelchair in her home or community environment due to bilateral shoulder weakness, hand weakness from history of carpal tunnel releases, impaired shoulder range of motion, and impaired cardiopulmonary function.  Danyel requires supplemental oxygen for COPD and has a history of respiratory failure.  Calyse's home does not support the use of a wide manual wheelchair.  Currently, [name] is unable to enter her bedroom and bathroom with her manual wheelchair and must perform multiple ADL's bed level.  [name] is not able to safely operate a power scooter (POV) due to the lack of upper extremity strength and range of motion needed to drive a tiller style propulsion system.  She also lacks sufficient lower extremity strength and balance to safely transfer on to and off a power scooter.    [name] has a history of gluteal ulceration and currently has a non-healing wound on her right foot.  She is unable to perform effective pressure relief in her current chair and must perform multiple transfers, with the assistance of an aide or her daughter, into her bed each day for pressure relief, placing her at increased risk for falls and increased risk for injury.  She also presents with bilateral lower extremity edema and neuropathic pain.  Due to ineffective pressure  relief and edema, [name] would benefit from power tilt, recline, and power elevating leg rests to minimize the number of transfers she must perform in a day.  Power elevating leg rests will allow [name] to be independent with elevating and lower her legs.  She is unable to bend forwards to elevate or lower the leg rests manually due to body habitus and upper extremity weakness.  She also is alone at intermittent periods of the day when her daughter is at school  and her aide is not present and would not have assistance for raising or lowering her leg rests.           Anterior / Posterior Obliquity Rotation-Pelvis   PELVIS    [] [] [x]  Neutral Posterior Anterior  [] [] [x]  WFL Rt elev Lt elev  [x] [] []  WFL Right Left                      Anterior    Anterior     [] Fixed [] Other [x] Partly Flexible [] Flexible   [] Fixed [] Other [x] Partly Flexible  [] Flexible  [] Fixed [] Other [] Partly Flexible  [] Flexible   TRUNK  [] [x] [x]  [facility]  Thoracic  Lumbar  Kyphosis Lordosis  [] [x] []  WFL Convex Convex  Right Left [x]c-curve []s-curve []multiple  [x] Neutral [] Left-anterior [] Right-anterior     [] Fixed [] Flexible [x] Partly Flexible [] Other  [] Fixed [] Flexible [x] Partly Flexible [] Other  [] Fixed             [] Flexible [] Partly Flexible [] Other    Position Windswept    HIPS          []           [x]              []   Neutral       Abduct        ADduct         [x]          []           []  Neutral Right           Left      [] Fixed [] Subluxed [x] Partly Flexible [] Dislocated [] Flexible  [] Fixed [] Other [] Partly Flexible  [] Flexible                 Foot Positioning Knee Positioning      [] WFL  []Lt []Rt [x] WFL  [x]Lt [x]Rt    KNEES ROM concerns: ROM concerns:    & Dorsi-Flexed []Lt []Rt     FEET Plantar Flexed []Lt [x]Rt      Inversion                 []Lt []Rt      Eversion                 []Lt []Rt     HEAD [x] Functional [x] Good  Head Control    & [] Flexed         [] Extended [] Adequate Head Control    NECK [] Rotated  Lt  [] Lat Flexed Lt [] Rotated  Rt [] Lat Flexed Rt [] Limited Head Control     [] Cervical Hyperextension [] Absent  Head Control     SHOULDERS ELBOWS WRIST& HAND       Left     Right    Left     Right    Left     Right   U/E []Functional           []Functional   []Fisting             []Fisting      []elev   [x]dep      [  x]elev   []dep       [x]pro -[]retract     [x]pro  []retract []subluxed             []subluxed           Goals for Wheelchair Mobility  [x] Independence with mobility in the home with motor related ADLs (MRADLs)  [x] Independence with MRADLs in the community [] Provide dependent mobility  [x] Provide recline     [x]Provide tilt   Goals for Seating system [x] Optimize pressure distribution [x] Provide support needed to facilitate function or safety [] Provide corrective forces to assist with maintaining or improving posture [] Accommodate client's posture:   current seated postures and positions are not flexible or will not tolerate corrective forces [x] Client to be independent with relieving pressure in the wheelchair []Enhance physiological function such as breathing, swallowing, digestion  Simulation ideas/Equipment trials: State why other equipment was unsuccessful:   MOBILITY BASE RECOMMENDATIONS and JUSTIFICATION: MOBILITY COMPONENT JUSTIFICATION  Manufacturer: Model:    Size: Width Seat Depth  [x]provide transport from point A to B      [x]promote Indep mobility  [x]is not a safe, functional ambulator [x]walker or cane inadequate []non-standard width/depth necessary to accommodate anatomical measurement []   []Manual Mobility Base []non-functional ambulator    []Scooter/POV  []can safely operate  []can safely transfer   []has adequate trunk stability  []cannot functionally propel manual w/c  [x]Power Mobility Base  [x]non-ambulatory  [x]cannot  functionally propel manual wheelchair  [x] cannot functionally and safely operate scooter/POV [x]can safely operate and willing to  []Stroller Base []infant/child  []unable to propel manual wheelchair []allows for growth []non-functional ambulator []non-functional UE []Indep mobility is not a goal at this time  [x]Tilt  []Forward [x]Backward [x]Powered tilt  []Manual tilt  []change position against gravitational force on head and shoulders  [x]change position for pressure relief/cannot weight shift []transfers  []management of tone [x]rest periods [x]control edema []facilitate postural control  []   [x]Recline  [x]Power recline on power base []Manual recline on manual base  []accommodate femur to back angle  []bring to full recline for ADL care  [x]change position for pressure relief/cannot weight shift [x]rest periods []repositioning for transfers or clothing/diaper /catheter changes []head positioning  []Lighter weight required []self- propulsion  []lifting []   [x]Heavy Duty required [x]user weight greater than 250# []extreme tone/ over active movement []broken frame on previous chair []   [] Back  [] Angle Adjustable [] Custom molded  []postural control []control of tone/spasticity []accommodation of range of motion []UE functional control []accommodation for seating system []  []provide lateral trunk support []accommodate deformity []provide posterior trunk support []provide lumbar/sacral support []support trunk in midline []Pressure relief over spinal processes  [x] Seat Cushion  []impaired sensation  []decubitus ulcers present [x]history of pressure ulceration []prevent pelvic extension [x]low maintenance  []stabilize pelvis  []accommodate obliquity []accommodate multiple deformity []neutralize lower extremity position [x]increase pressure distribution []   [] Pelvic/thigh support  [] Lateral thigh guide [] Distal medial pad  [] Distal lateral pad []  pelvis in neutral []accommodate pelvis [] position upper legs [] alignment [] accommodate ROM [] decr adduction []accommodate tone []removable for transfers []decr abduction  [] Lateral trunk Supports [] Lt     [] Rt []decrease lateral trunk leaning []control tone []contour for increased contact []safety  []accommodate asymmetry []   [x] Mounting hardware  []lateral trunk supports  []back   []seat [x]headrest      []  thigh support []fixed   []swing away []attach seat platform/cushion to w/c frame []attach back cushion to w/c frame []mount postural supports [x]mount headrest  []swing medial thigh support away []swing lateral supports away for transfers  []     Armrests  []fixed [x]adjustable height []removable   []swing away  [x]flip back   []reclining [x]full length pads []desk    []pads tubular  [x]provide support with elbow at 90   []provide support for w/c tray [x]change of height/angles for variable activities [x]remove for transfers []allow to come closer to table top [x]remove for access to tables []   Hangers/ Leg rests  []60 []70 []90 [x]elevating [x]heavy duty  []articulating []fixed []lift off [x]swing away     [x]power [x]provide LE support  []accommodate to hamstring tightness [x]elevate legs during recline   []provide change in position for Legs [x]Maintain placement of feet on footplate [x]durability [x]enable transfers [x]decrease edema []Accommodate lower leg length []   Foot support Footplate    [x]Lt  [x] Rt  [] Center mount [x]flip up     [x]depth/angle adjustable []Amputee adapter    [] Lt     [] Rt [x]provide foot support [x]accommodate to ankle ROM [x]transfers []Provide support for residual extremity [] allow foot to go under wheelchair base [] decrease tone  []   [] Ankle strap/heel loops []support foot on foot support []decrease extraneous movement []provide input to heel  []protect foot  Tires: []pneumatic  []flat free inserts  []solid   []decrease maintenance  []prevent frequent flats []increase shock absorbency []decrease pain from road shock []decrease spasms from road shock []   [x] Headrest  [x]provide posterior head support []provide posterior neck support []provide lateral head support []provide anterior head support [x]support during tilt and recline []improve feeding   []improve respiration []placement of switches []safety  []accommodate ROM  []accommodate tone []improve visual orientation  [] Anterior chest strap [] Vest [] Shoulder retractors  []decrease forward movement of shoulder []accommodation of TLSO []decrease forward movement of trunk []decrease shoulder elevation []added abdominal support []alignment []assistance with shoulder control  []   Pelvic Positioner [x]Belt []SubASIS bar []Dual Pull []stabilize tone [x]decrease falling out of chair/ **will not Decr potential for sliding due to pelvic tilting []prevent excessive rotation []pad for protection over boney prominence []prominence comfort []special pull angle to control rotation []   Upper Extremity Support []L   [] R []Arm trough    []hand support [] tray       []full tray []swivel mount []decrease edema      []decrease subluxation   []control tone   []placement for AAC/Computer/EADL []decrease gravitational pull on shoulders []provide midline positioning []provide support to increase UE function []provide hand support in natural position []provide work surface   POWER WHEELCHAIR CONTROLS  [x]Proportional  []Non-Proportional Type  []Left  [x]Right [x]provides access for controlling wheelchair   []lacks motor control to operate proportional drive control []unable to understand proportional controls  Actuator Control Module  []Single  [x]Multiple   [x]Allow the client to operate the power seat function(s) through the joystick control   []Safety Reset Switches []Used to change modes and stop the wheelchair when driving in  latch mode    [x]Upgraded Electronics   [x]programming for accurate control []progressive Disease/changing condition []non-proportional drive control needed [x]Needed in order to operate power seat functions through joystick control   []Display box []Allows user to see in which mode and drive the wheelchair is set  []necessary for alternate  controls    []Digital interface electronics []Allows w/c to operate when using alternative drive controls  []ASL Head Array []Allows client to operate wheelchair  through switches placed in tri-panel headrest  []Sip and puff with tubing kit []needed to operate sip and puff drive controls  []Upgraded tracking electronics []increase safety when driving []correct tracking when on uneven surfaces  [x]Mount for switches or joystick [x]Attaches switches to w/c  [x]Swing away for access or transfers []midline for optimal placement []provides for consistent access  []Attendant controlled joystick plus mount []safety []long distance driving []operation of seat functions []compliance with transportation regulations []     Rear wheel placement/Axle adjustability []None []semi adjustable []fully adjustable  []improved UE access to wheels []improved stability []changing angle in space for improvement of postural stability []1-arm drive access []amputee pad placement []   Wheel rims/ hand rims  []metal  []plastic coated []oblique projections []vertical projections []Provide ability to propel manual wheelchair  [] Increase self-propulsion with hand weakness/decreased grasp  Push handles []extended  []angle adjustable  []standard []caregiver access []caregiver assist []allows "hooking" to enable increased ability to perform ADLs or maintain balance  One armed device  []Lt   []Rt []enable propulsion of manual wheelchair with one arm   []     Brake/wheel lock extension [] Lt   [] Rt []increase indep in applying wheel locks   []Side guards []prevent clothing  getting caught in wheel or becoming soiled [] prevent skin tears/abrasions  Battery:  [x]to power wheelchair   Other:     The above equipment has a life- long use expectancy. Growth and changes in medical and/or functional conditions would be the exceptions. This is to certify that the therapist has no financial relationship with durable medical provider or manufacturer. The therapist will not receive remuneration of any kind for the equipment recommended in this evaluation.   Patient has mobility limitation that significantly impairs safe, timely participation in one or more mobility related ADL's.  (bathing, toileting, feeding, dressing, grooming, moving from room to room)                                                             [x] Yes [] No Will mobility device sufficiently improve ability to participate and/or be aided in participation of MRADL's?         [x] Yes [] No Can limitation be compensated for with use of a cane or walker?                                                                                [] Yes [x] No Does patient or caregiver demonstrate ability/potential ability & willingness to safely use the mobility device?   [x] Yes [] No Does patient's home environment support use of recommended mobility device?                                                    [  x] Yes [] No Does patient have sufficient upper extremity function necessary to functionally propel a manual wheelchair?    [] Yes [x] No Does patient have sufficient strength and trunk stability to safely operate a POV (scooter)?                                  [] Yes [x] No Does patient need additional features/benefits provided by a power wheelchair for MRADL's in the home?       [x] Yes [] No Does the patient demonstrate the ability to safely use a power wheelchair?                                                              [x] Yes [] No  Therapist Name Printed: , PT, DPT Date:   Therapist's Signature:   Date:    Supplier's Name Printed:  Date:   Supplier's Signature:   Date:  Patient/Caregiver Signature:   Date:     This is to certify that I have read this evaluation and do agree with the content within:   15 Name Printed:   63 Signature:  Date:     This is to certify that I, the above signed therapist have the following affiliations: [] This DME provider [] Manufacturer of recommended equipment [] Patient's long term care facility [x] None of the above        Objective measurements completed on examination: See above findings.       PT Education - 06/17/21 2012     Education Details Power wheelchair recommendations; timeline for obtaining power wheelchair, not able to provide this evaluation as justification for lift chair    Person(s) Educated Patient;Caregiver(s)    Methods Explanation    Comprehension Verbalized understanding               Plan - 06/17/21 2014     Clinical Impression Statement Pt is a 45 year old female referred to Neuro OPPT for evaluation for power mobility due to L BKA, R transmetatarsal amputation with impaired mobility.  Pt's PMH is significant for the following: asthma, bipolar, carpal tunnel syndrome on R, cellulitis, chronic pain, COPD, type 2 DM, HLD, HTN, morbid obesity, OSA, peripheral neuropathy, osteomyelitis, SVT, R transmetatarsal amputation, R knee arthroscopy, L knee ligament repair, I&D of L perirectal abscess, bilat carpal tunnel release, L BKA. The following deficits were noted during pt's exam: pain and edema in bilat LE, impaired sensation, impaired ROM and strength in bilat UE and LE, impaired standing balance, impaired cardiopulmonary endurance and activity tolerance, impaired gait placing patient at increased risk for falls.  Pt would benefit from a Power wheelchair with power tilt, recline and elevating leg rests to maximize functional mobility independence, independent pressure relief, pain management, edema  management and reduce falls risk.    Personal Factors and Comorbidities Comorbidity 3+;Fitness;Past/Current Experience    Comorbidities asthma, bipolar, carpal tunnel syndrome on R, cellulitis, chronic pain, COPD, type 2 DM, HLD, HTN, morbid obesity, OSA, peripheral neuropathy, osteomyelitis, SVT, R transmetatarsal amputation, R knee arthroscopy, L knee ligament repair, I&D of L perirectal abscess, bilat carpal tunnel release, L BKA    Examination-Activity Limitations Bathing;Dressing;Hygiene/Grooming;Locomotion Level;Stand;Toileting;Transfers    Examination-Participation  Restrictions Community Activity;Laundry;Meal Prep;Shop    Stability/Clinical Decision Making Evolving/Moderate complexity    Clinical Decision Making Moderate    Rehab Potential Good    PT Frequency One time visit    PT Duration Other (comment)   one time visit for wheelchair evaluation   Consulted and Agree with Plan of Care Patient;Family member/caregiver             Patient will benefit from skilled therapeutic intervention in order to improve the following deficits and impairments:  Cardiopulmonary status limiting activity, Decreased activity tolerance, Decreased balance, Decreased endurance, Decreased mobility, Decreased strength, Decreased skin integrity, Decreased range of motion, Difficulty walking, Increased edema, Impaired sensation, Pain, Obesity  Visit Diagnosis: Other abnormalities of gait and mobility  Unsteadiness on feet  Abnormal posture  Pain in right leg  Foot drop, right  Pain in left leg  Muscle weakness (generalized)  Repeated falls     Problem List Patient Active Problem List   Diagnosis Date Noted   Chest pain with low risk for cardiac etiology 05/11/2021   Morbid obesity with BMI of 60.0-69.9, adult ([place]) 04/15/2021   Hypoxia 04/15/2021   UTI (urinary tract infection) 04/15/2021   Chronic pain 04/15/2021   Pneumonia due to COVID-19 virus 04/14/2021   Urinary incontinence  10/23/2020   Elevated troponin    Acute respiratory failure with hypoxia and hypercarbia ([name]) 06/15/2020   Chronic respiratory failure with hypoxia ([place]) 06/14/2020   Blister of foot 04/21/2020   Exposure to mold 04/21/2020   Constipation 04/03/2020   Left shoulder pain 06/23/2019   Dysuria 06/14/2019   Hyperglycemia due to diabetes mellitus ([place])    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[place]) 04/11/2019   Allergic rhinitis 03/06/2018   Class 3 severe obesity due to excess calories with serious comorbidity and body mass index (BMI) of 50.0 to 59.9 in adult ([place])    Decreased pedal pulses 06/10/2017   Unilateral primary osteoarthritis, right knee 10/22/2016   Primary osteoarthritis of first carpometacarpal joint of left hand 07/30/2016   Diabetic neuropathy ([place]) 07/14/2016   Nausea 03/17/2016   De Quervain's tenosynovitis, bilateral 11/01/2015   Vitamin D deficiency 09/05/2015   Recurrent candidiasis of vagina 09/05/2015   Varicose veins of leg with complications 31/51/7616   Restless leg syndrome 10/17/2014   Chronic sinusitis 07/18/2014   Encounter for chronic pain management 06/30/2013   HLD (hyperlipidemia) 11/19/2012   Angina pectoris ([place]) 06/27/2012   Abscess of skin and subcutaneous tissue 11/04/2011   Right carpal tunnel syndrome 09/01/2011   Bilateral knee pain 09/01/2011   DM (diabetes mellitus) type II uncontrolled, periph vascular disorder ([place]) 05/22/2008   Morbid obesity ([place]) 05/22/2008   Obesity hypoventilation syndrome ([place]) 05/22/2008   Mood disorder ([place]) 05/22/2008   Obstructive sleep apnea 05/22/2008   Hypertension 05/22/2008   Asthma 05/22/2008   GERD 05/22/2008    Rico Junker, PT, DPT 06/17/21    8:27 PM    [facility] [address] [place], [place], [address] Phone: [phone]   Fax:  [phone]  Name: [name] MRN: [id number] Date of Birth: 04-15-1976

## 2021-06-22 ENCOUNTER — Other Ambulatory Visit: Payer: Self-pay | Admitting: Family Medicine

## 2021-06-23 NOTE — Telephone Encounter (Signed)
Community message has been sent to [employer] to see what process is.   Will await response.   Talbot Grumbling, RN

## 2021-06-24 ENCOUNTER — Telehealth: Payer: Self-pay | Admitting: *Deleted

## 2021-06-24 NOTE — Chronic Care Management (AMB) (Signed)
  Care Management   Note  06/24/2021 Name: [name] MRN: [id number] DOB: 02/08/1976  [name] is a 45 y.o. year old female who is a primary care patient of [name], MD and is actively engaged with the care management team. I reached out to [name] by phone today to assist with re-scheduling a follow up visit with the Licensed Clinical Social Worker  Follow up plan: Patient declines further follow up and engagement by the care management team. Appropriate care team members and provider have been notified via electronic communication. The care management team is available to follow up with the patient after provider conversation with the patient regarding recommendation for care management engagement and subsequent re-referral to the care management team.   [facility]

## 2021-06-25 NOTE — Telephone Encounter (Signed)
Can you guys check with patient on whether she needs this medication? It's not on her current medication list.   Thanks, [name], MD

## 2021-06-25 NOTE — Telephone Encounter (Signed)
Spoke with pt and she said she does need it because she gets nauseated after eating. Melaney Tellefsen Kennon Holter, CMA

## 2021-06-30 ENCOUNTER — Other Ambulatory Visit: Payer: Self-pay | Admitting: Family Medicine

## 2021-06-30 DIAGNOSIS — I5033 Acute on chronic diastolic (congestive) heart failure: Secondary | ICD-10-CM

## 2021-07-02 ENCOUNTER — Telehealth: Payer: Self-pay

## 2021-07-02 ENCOUNTER — Other Ambulatory Visit: Payer: Self-pay

## 2021-07-02 ENCOUNTER — Ambulatory Visit (INDEPENDENT_AMBULATORY_CARE_PROVIDER_SITE_OTHER): Payer: Medicaid Other | Admitting: Family Medicine

## 2021-07-02 VITALS — BP 131/69 | HR 72 | Wt 368.2 lb

## 2021-07-02 DIAGNOSIS — Z7409 Other reduced mobility: Secondary | ICD-10-CM

## 2021-07-02 DIAGNOSIS — J9611 Chronic respiratory failure with hypoxia: Secondary | ICD-10-CM

## 2021-07-02 DIAGNOSIS — G8929 Other chronic pain: Secondary | ICD-10-CM

## 2021-07-02 MED ORDER — OXYCODONE-ACETAMINOPHEN 7.5-325 MG PO TABS: 1.0000 | ORAL_TABLET | Freq: Three times a day (TID) | ORAL | 0 refills | Status: DC | PRN

## 2021-07-02 NOTE — Patient Instructions (Signed)
It was great to see you again today!  Jarrett Soho and Page are working on the prior auth and Doctor, general practice in referral for lift chair evaluation through physical therapy  Sent in new prescription for oxycodone to  fill on 8/5. Follow up with me in 1 month, sooner if needed  Be well, Dr. [name]

## 2021-07-02 NOTE — Progress Notes (Signed)
  Date of Visit: 07/02/2021   SUBJECTIVE:   HPI:  [name] presents today for follow up. She has several items she wants to discuss today.  Insomnia - having trouble sleeping. Previously I prescribed ramelteon, which she has not yet been able to get as it evidently requires a PA.  Chronic respiratory failure with hypoxia - using 3L most of the time at home. Ever since she had COVID a few months ago, has needed oxygen. She is unable to really travel outside of her home easily because she does not have a portable oxygen concentrator, and her tanks run out quickly. Still waiting on POC through [employer].  Impaired mobility - per patient, her case worker says she needs a [facility] physical therapy evaluation to confirm her home can accommodate a lift chair. Requests referral for this today.  Chronic pain - has appointment with pain management but not for several months. Currently taking hydrocodone-acetaminophen 10-'325mg'$ . Takes 1 tablet 3 times a day on most days, some days does take a 4th tablet. Reports pain is not well controlled with this medication. She is frustrated to wait so long to see pain management. Pain is located primarily in her legs. Has been more active with trying to walk more on prosthesis lately.   OBJECTIVE:   BP 131/69   Pulse 72   Wt (!) 368 lb 3.2 oz (167 kg)   LMP 05/06/2021 (Approximate)   SpO2 95%   BMI 67.34 kg/m  Gen: no acute distress, pleasant cooperative HEENT: normocephalic, atraumatic  Heart: regular rate and rhythm, no murmur Lungs: clear to auscultation bilaterally, normal work of breathing  Neuro: alert, speech normal grossly nonfocal Extremity: s/p L BKA, s/p R TMA. Small ulcer on distal part of transmetatarsal amputation site appears healing well, is scabbed over, no streaking erythema or drainage.   ASSESSMENT/PLAN:    Encounter for chronic pain management Pain uncontrolled on current regimen of norco. Has referral to pain mgmt pending, but appointment is  several months out. I think it would be reasonable to change her from hydrocodone to oxycodone to see if this gets her any improved pain control. Goal of treating her with chronic narcotics is to enable her to be more physically active, in order to lose weight and improve her health. She has severe morbid obesity and immobility, and cannot do much physically without significant pain. Will change rx to oxycodone-acetaminophen 7.5-'325mg'$  1 tab three times daily. This is relatively similar to prior dosing in terms of MME. Follow up in 1 month to evaluate for improvement.  Chronic respiratory failure with hypoxia ([name]) Continues to need portable oxygen concentrator. Spoke with Jarrett Soho RN who is working with [employer] to get this for her.  Impaired mobility Placed referral to HHPT to evaluate her home for a lift chair.  Insomnia Spoke with Page CMA who is working on PA for ramelteon.  FOLLOW UP: Follow up in 1 mo for chronic pain  [name], [place]

## 2021-07-02 NOTE — Telephone Encounter (Signed)
Completed PA info in [place] for Ramelteon '8mg'$ . Status pending. Will recheck status in 24 hours.

## 2021-07-02 NOTE — Telephone Encounter (Signed)
Medications approved through 07/02/2021 - 12/29/2021. The pharmacy has been updated.

## 2021-07-04 ENCOUNTER — Emergency Department (HOSPITAL_COMMUNITY)
Admission: EM | Admit: 2021-07-04 | Discharge: 2021-07-04 | Disposition: A | Discharge status: Home / Self Care | Payer: Medicaid Other | Attending: Emergency Medicine | Admitting: Emergency Medicine

## 2021-07-04 ENCOUNTER — Encounter (HOSPITAL_COMMUNITY): Payer: Self-pay | Admitting: Emergency Medicine

## 2021-07-04 DIAGNOSIS — Z5321 Procedure and treatment not carried out due to patient leaving prior to being seen by health care provider: Secondary | ICD-10-CM | POA: Insufficient documentation

## 2021-07-04 DIAGNOSIS — R1010 Upper abdominal pain, unspecified: Secondary | ICD-10-CM | POA: Insufficient documentation

## 2021-07-04 LAB — CBG MONITORING, ED: Glucose-Capillary: 444 mg/dL — ABNORMAL HIGH (ref 70–99)

## 2021-07-04 NOTE — ED Triage Notes (Signed)
Patient here from home reporting upper abd pain that started 39mn ago while sleeping. CBG 451 "that's my normal". Reports that she has not had a BM since Thursday.

## 2021-07-04 NOTE — ED Notes (Signed)
Daughter came to pick patient up, patient states she cannot wait any longer.

## 2021-07-06 IMAGING — DX DG CHEST 2V
2 series · 2 of 2 positions shown · non-contrast
Comparison: 04/14/2020

CLINICAL DATA: Chest pain and shortness of breath.

EXAM:
CHEST - 2 VIEW

[chest lat]
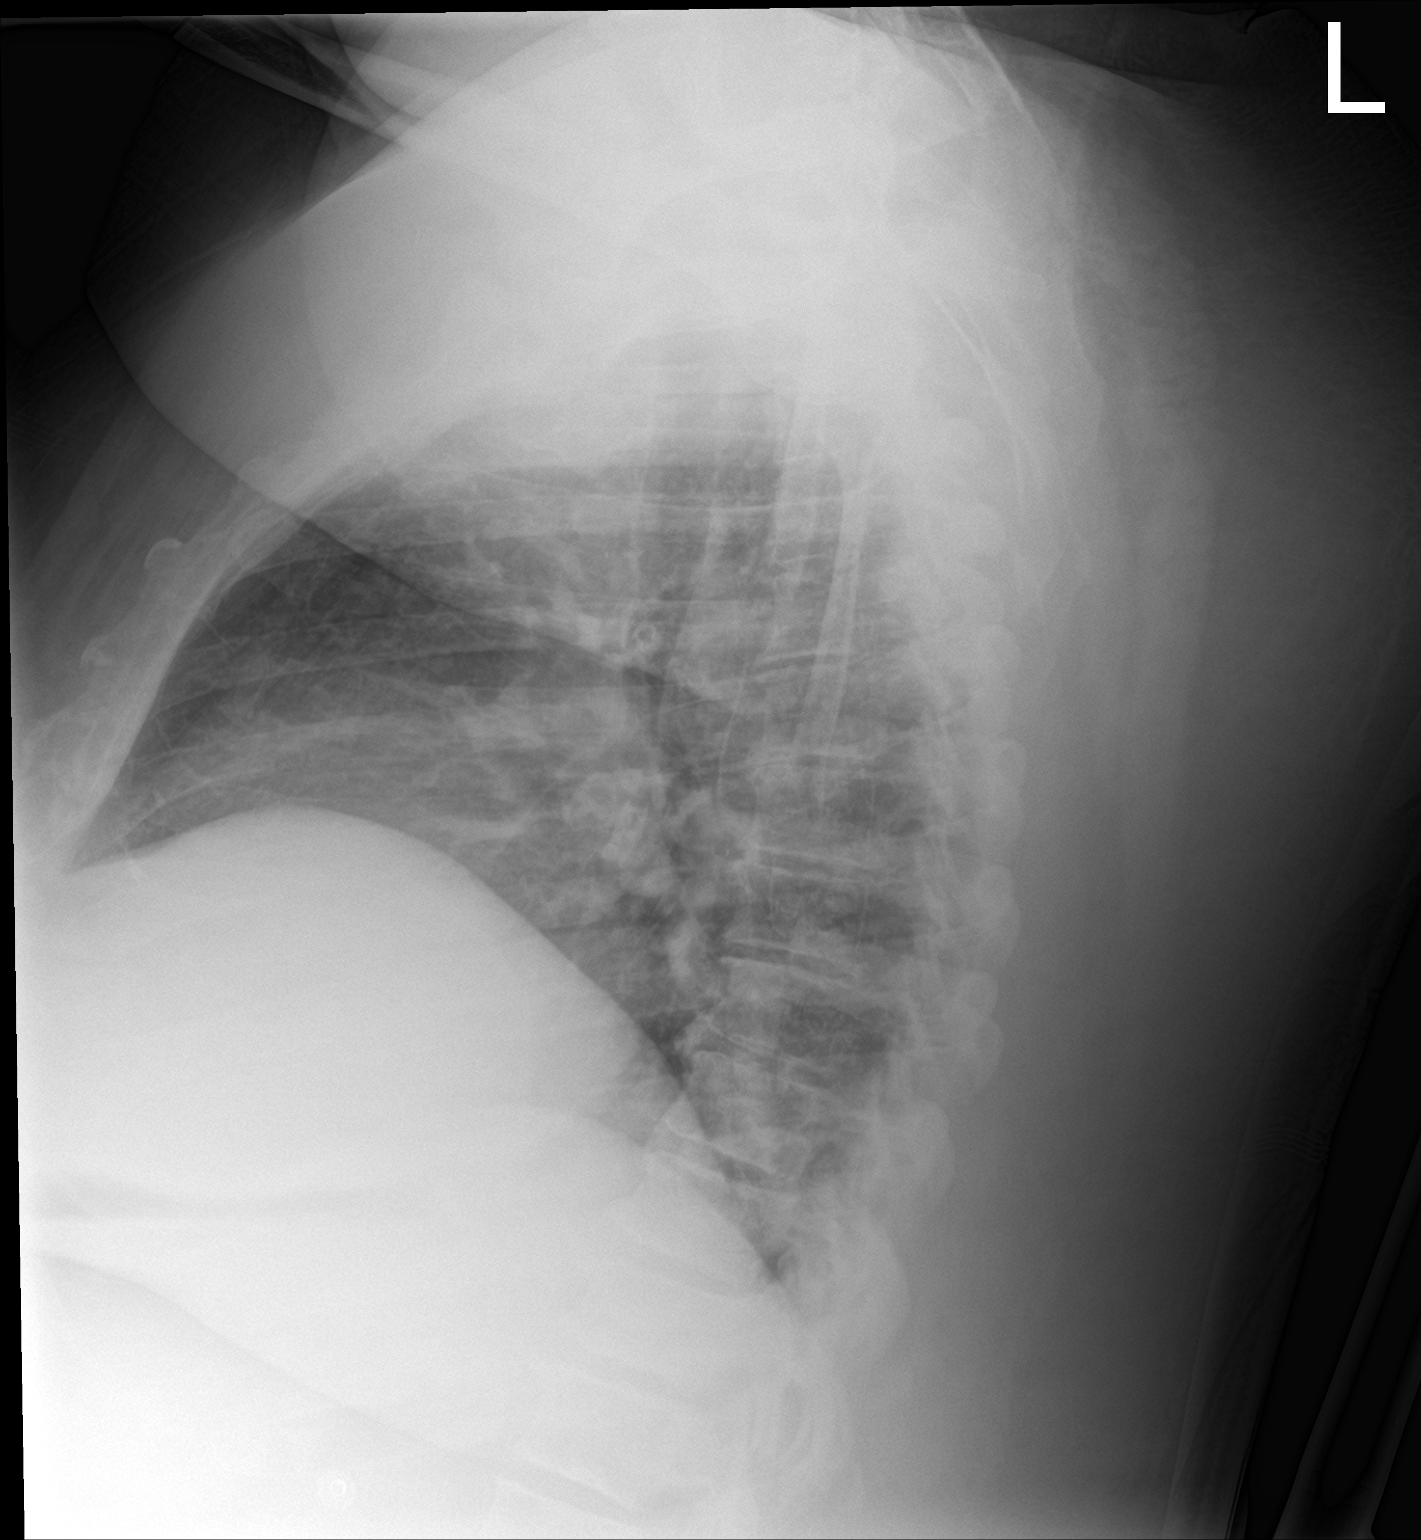

[chest ap]
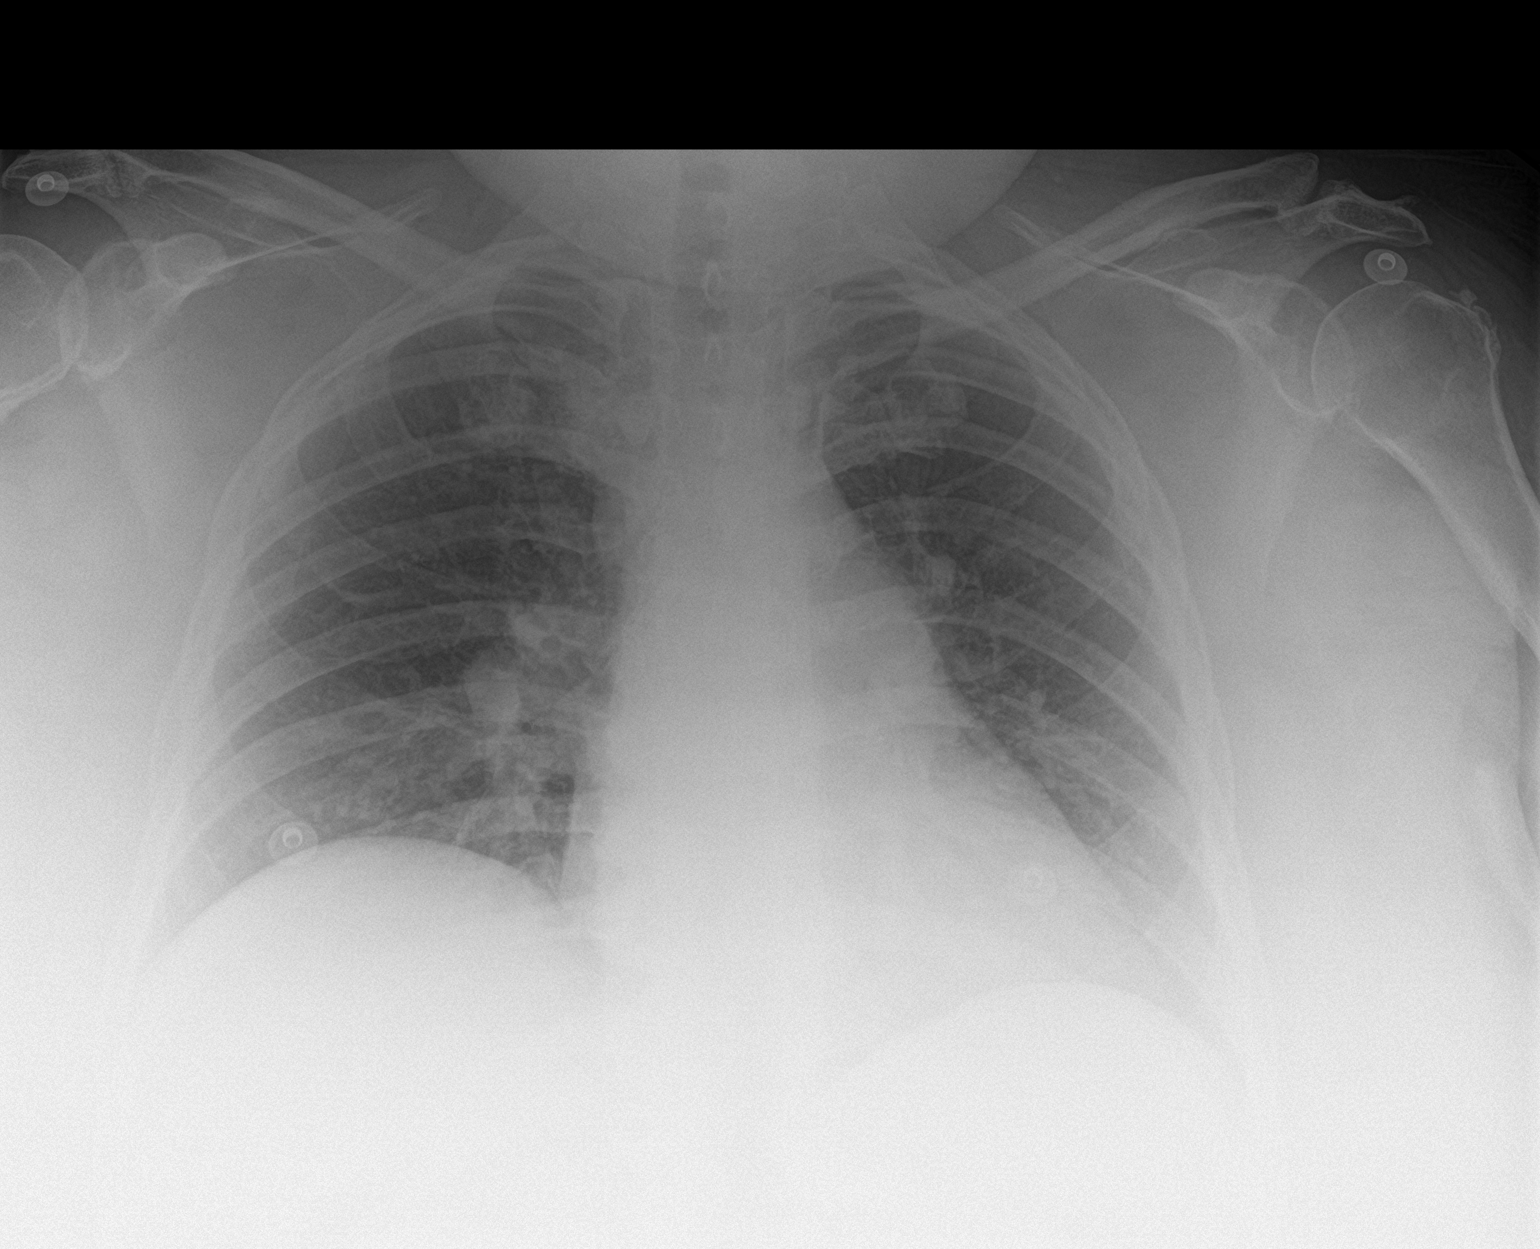

[2 of 2 positions shown; findings below may reference images not displayed]

FINDINGS: The heart size and mediastinal contours are within normal limits.
Both lungs are clear. The visualized skeletal structures are
unremarkable.
IMPRESSION: No active cardiopulmonary disease.

## 2021-07-06 IMAGING — CT CT ANGIO CHEST
2 of 6 series · 18 of 46 positions shown · IV contrast (omnipaque)
Comparison: June 27, 2012

CLINICAL DATA: Chest pain and shortness of breath.

EXAM:
CT ANGIOGRAPHY CHEST WITH CONTRAST
TECHNIQUE: Multidetector CT imaging of the chest was performed using the
standard protocol during bolus administration of intravenous
contrast. Multiplanar CT image reconstructions and MIPs were
obtained to evaluate the vascular anatomy.
CONTRAST:  100mL OMNIPAQUE IOHEXOL 350 MG/ML SOLN

[Series 6: thins · axial · 0.84mm/px · z∈[+1109,+1327]mm · 15 of 240 slices shown]
[im 11/240  lung]
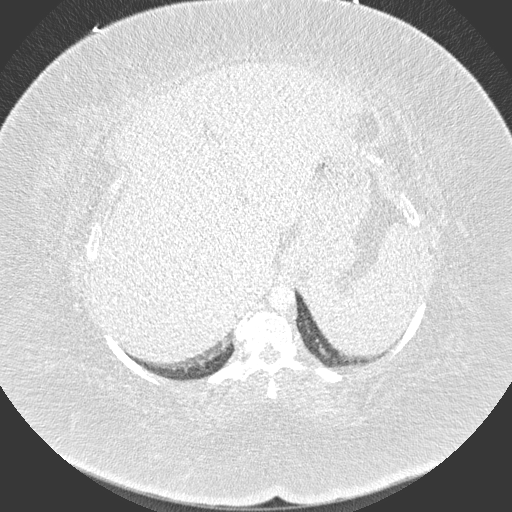
[im 32/240  soft-tissue]
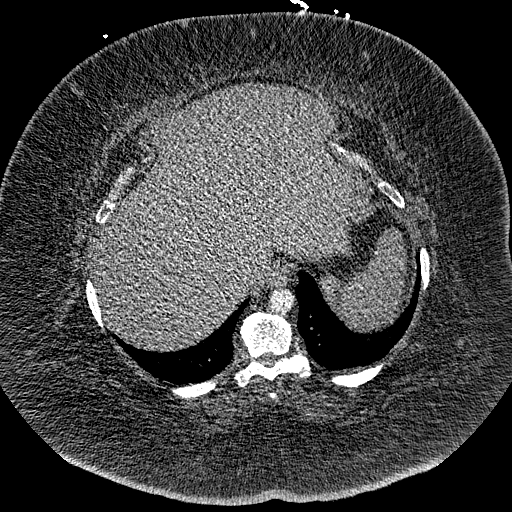
[im 42/240  lung]
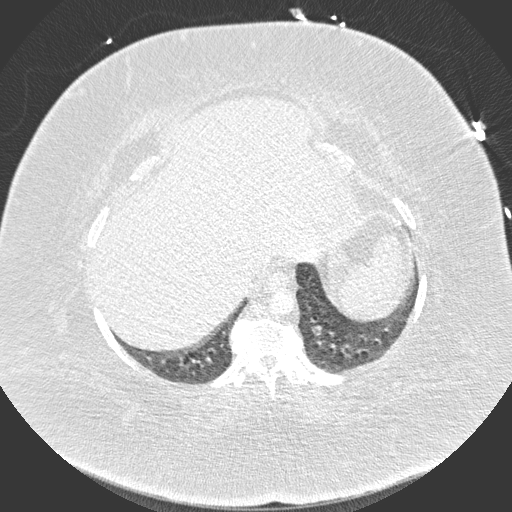
[im 63/240  soft-tissue]
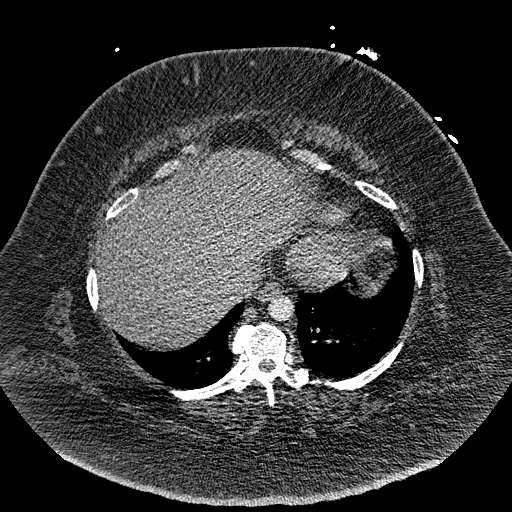
[im 73/240  lung]
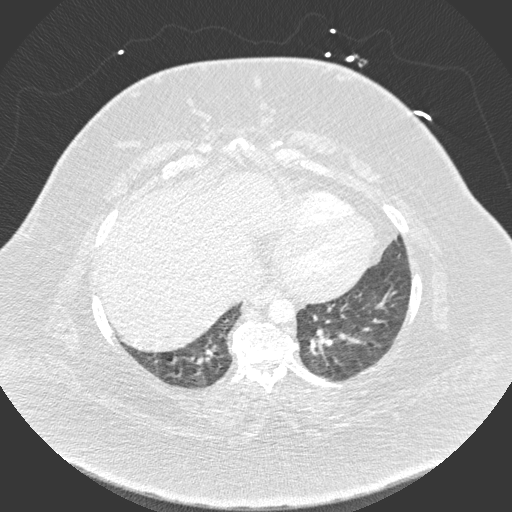
[im 94/240  soft-tissue]
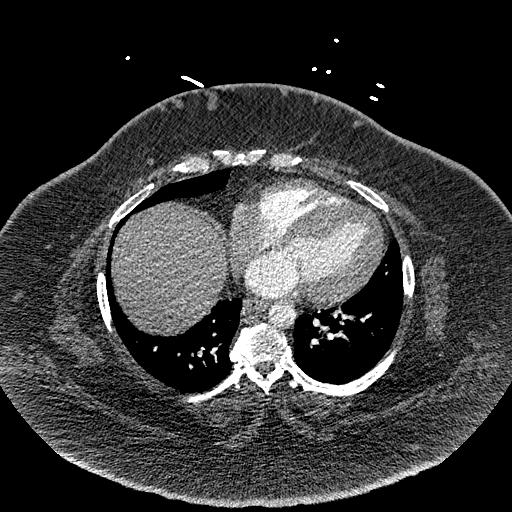
[im 104/240  lung]
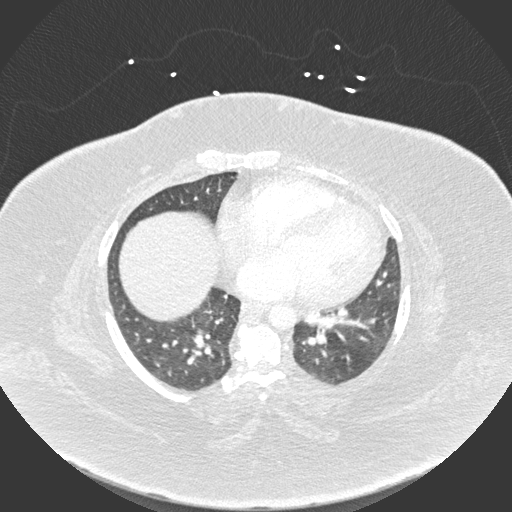
[im 125/240  soft-tissue]
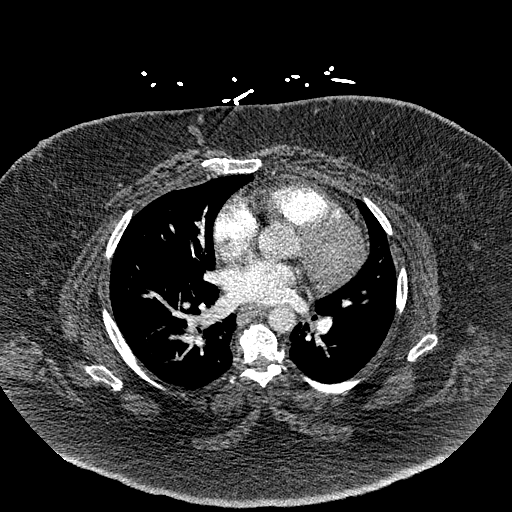
[im 136/240  lung]
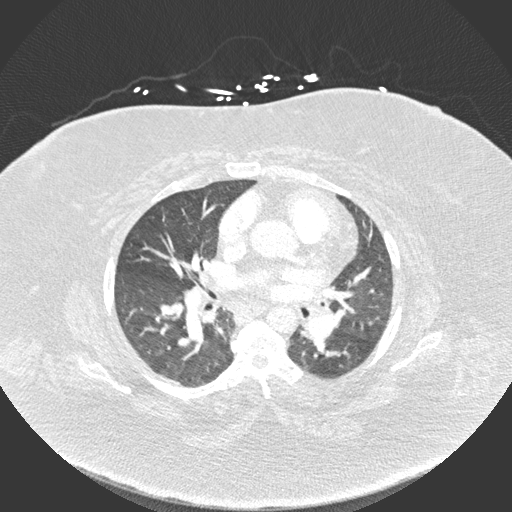
[im 146/240  soft-tissue]
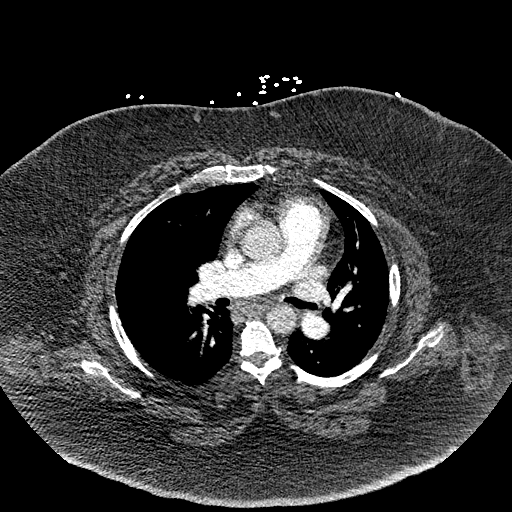
[im 167/240  lung]
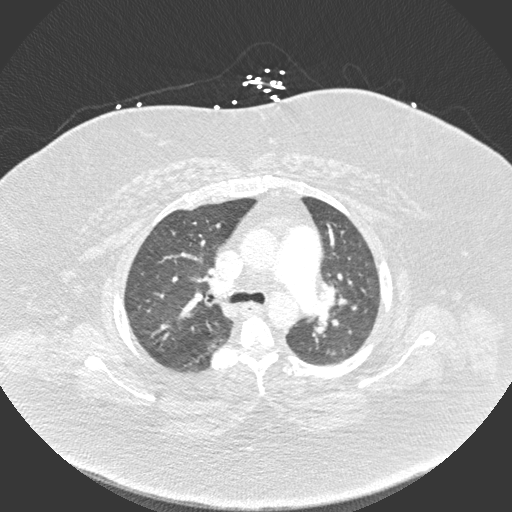
[im 177/240  soft-tissue]
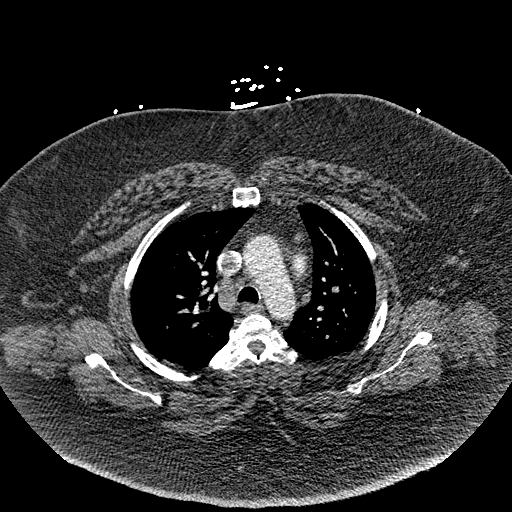
[im 198/240  lung]
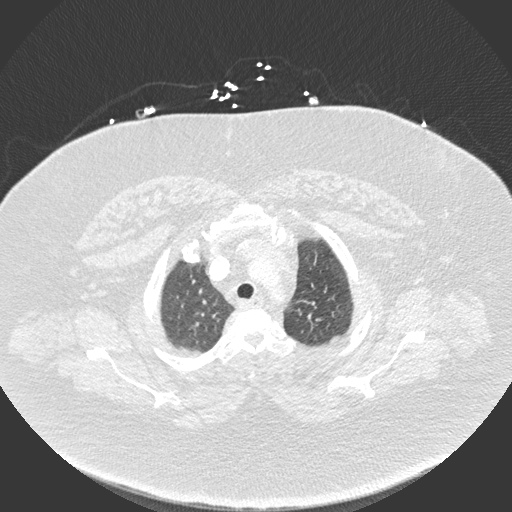
[im 208/240  soft-tissue]
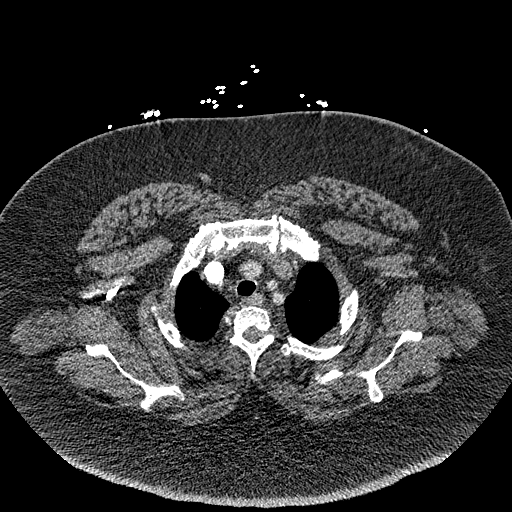
[im 229/240  lung]
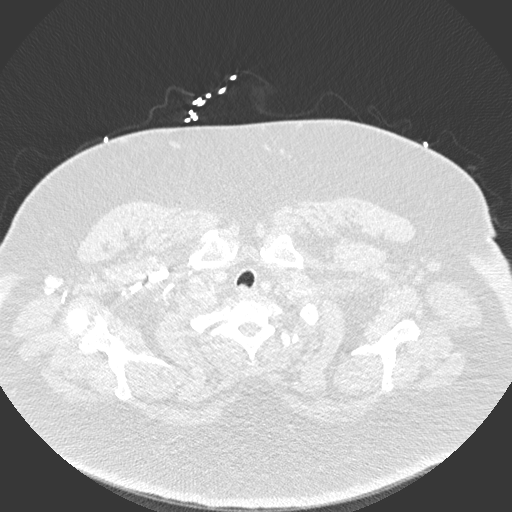

[Series 8: coronal mpr · coronal · 0.49mm/px · 3 of 169 slices shown]
[im 43/169  soft-tissue]
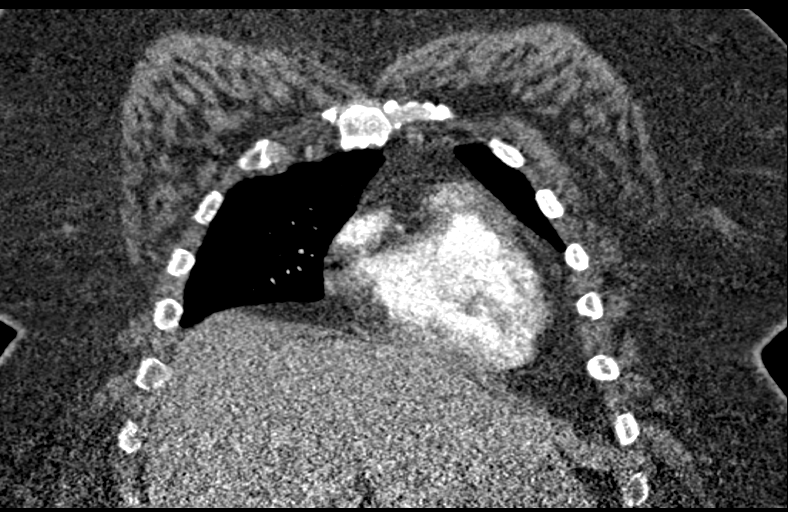
[im 85/169  soft-tissue]
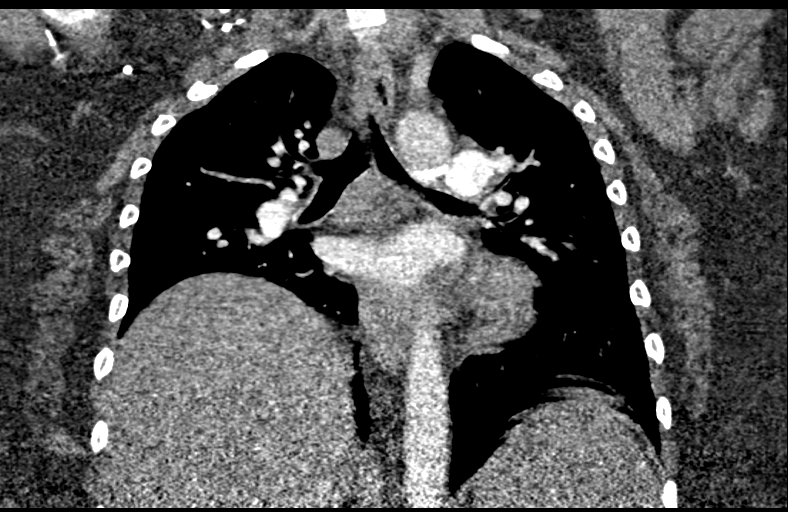
[im 127/169  soft-tissue]
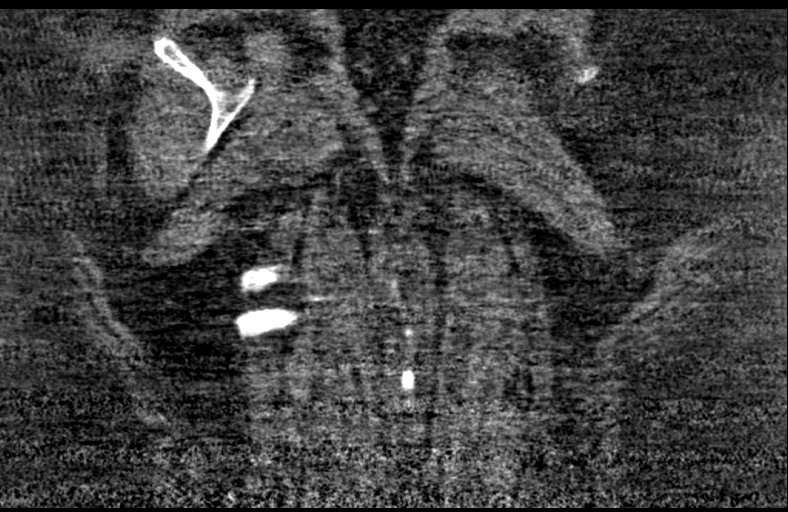

[18 of 46 positions shown; findings below may reference images not displayed]

FINDINGS: Cardiovascular: Satisfactory opacification of the pulmonary arteries
to the segmental level. No evidence of pulmonary embolism. Normal
heart size. No pericardial effusion.

Mediastinum/Nodes: No enlarged mediastinal, hilar, or axillary lymph
nodes. Thyroid gland, trachea, and esophagus demonstrate no
significant findings.

Lungs/Pleura: Lungs are clear. No pleural effusion or pneumothorax.

Upper Abdomen: No acute abnormality.

Musculoskeletal: No chest wall abnormality. No acute or significant
osseous findings.

Review of the MIP images confirms the above findings.
IMPRESSION: Negative examination for pulmonary embolism or acute cardiopulmonary
disease.

## 2021-07-07 ENCOUNTER — Other Ambulatory Visit: Payer: Self-pay

## 2021-07-07 DIAGNOSIS — Z7409 Other reduced mobility: Secondary | ICD-10-CM | POA: Insufficient documentation

## 2021-07-07 NOTE — Assessment & Plan Note (Signed)
Pain uncontrolled on current regimen of norco. Has referral to pain mgmt pending, but appointment is several months out. I think it would be reasonable to change her from hydrocodone to oxycodone to see if this gets her any improved pain control. Goal of treating her with chronic narcotics is to enable her to be more physically active, in order to lose weight and improve her health. She has severe morbid obesity and immobility, and cannot do much physically without significant pain. Will change rx to oxycodone-acetaminophen 7.5-'325mg'$  1 tab three times daily. This is relatively similar to prior dosing in terms of MME. Follow up in 1 month to evaluate for improvement.

## 2021-07-07 NOTE — Assessment & Plan Note (Signed)
Placed referral to HHPT to evaluate her home for a lift chair.

## 2021-07-07 NOTE — Assessment & Plan Note (Signed)
Continues to need portable oxygen concentrator. Spoke with Jarrett Soho RN who is working with [employer] to get this for her.

## 2021-07-07 NOTE — Telephone Encounter (Signed)
Prior approval for oxycodone-acetaminophen completed via [place] [employer].  Med approved for 07/07/2021 - 01/03/2022  Prior approval # [id number].  [facility] informed.  Talbot Grumbling, RN

## 2021-07-08 IMAGING — DX DG CHEST 1V PORT
2 series · 2 of 2 positions shown · non-contrast
Comparison: Two-view chest x-ray 05/27/2020

CLINICAL DATA: Shortness of breath.  Midsternal chest pain.

EXAM:
PORTABLE CHEST 1 VIEW

[chest ap (1 of 2)]
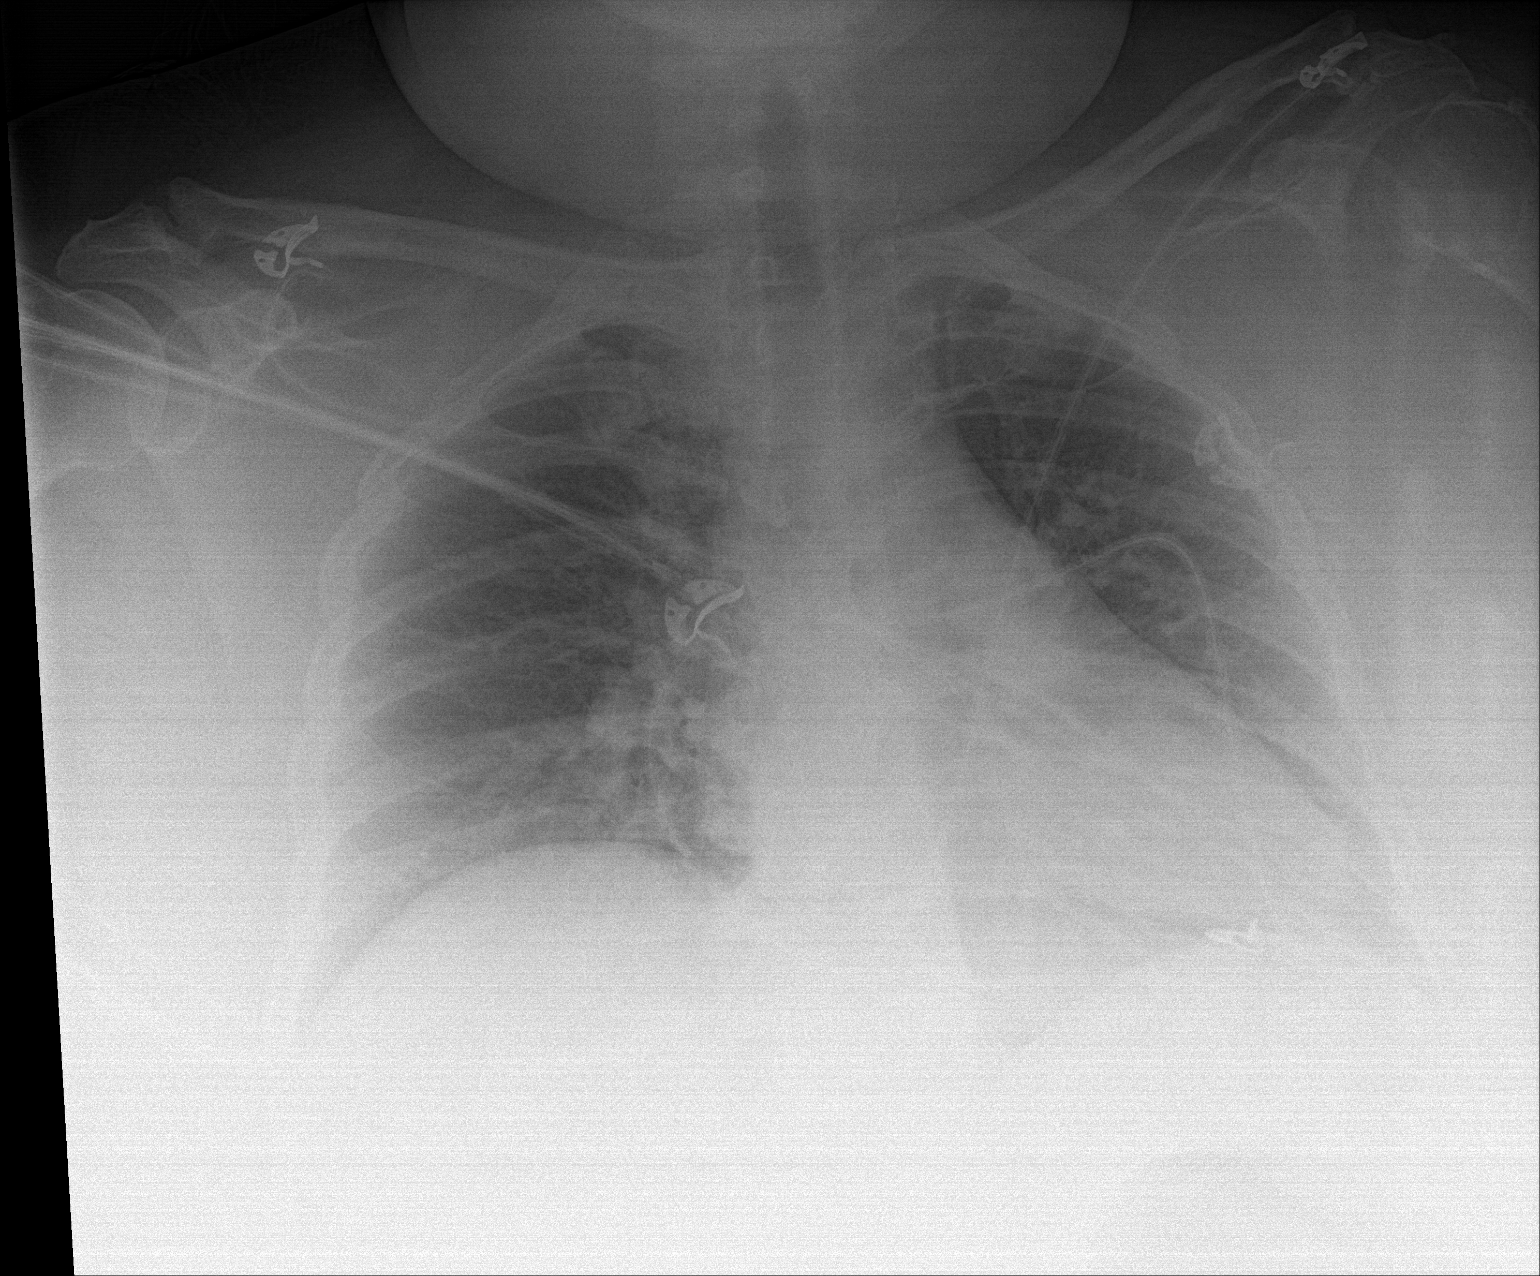

[chest ap (2 of 2)]
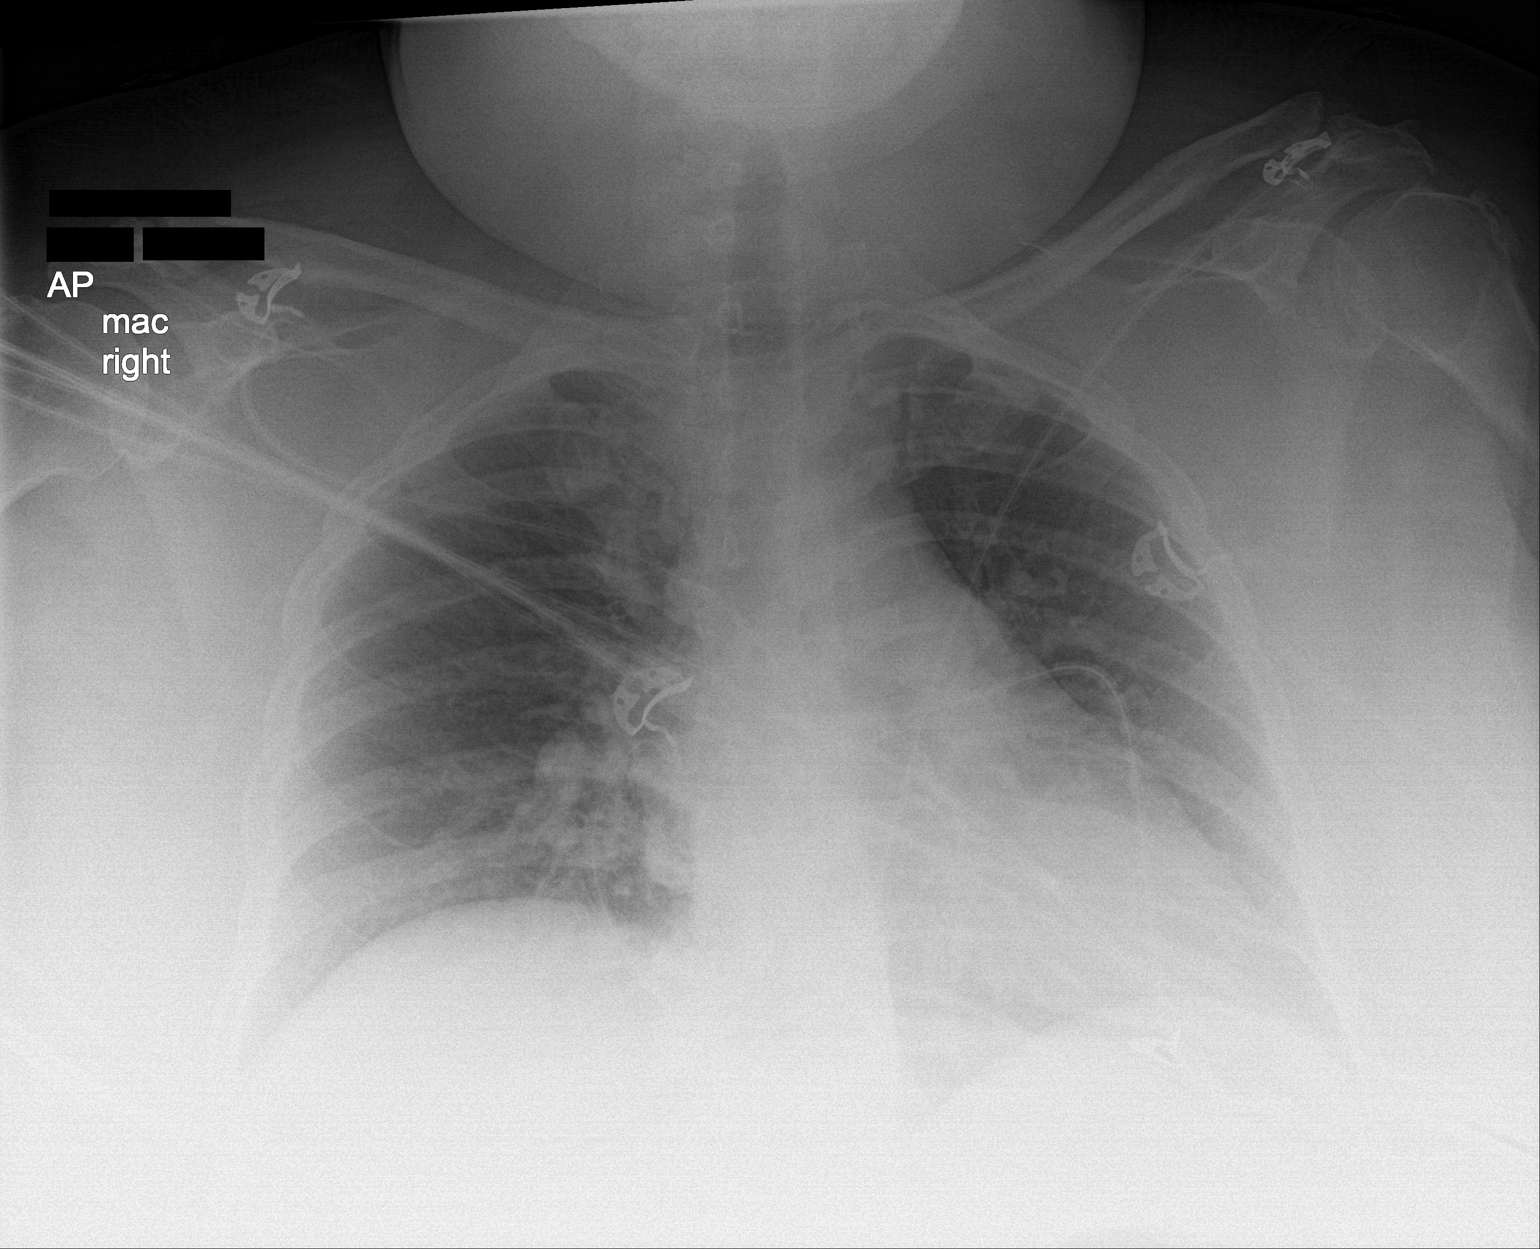

[2 of 2 positions shown; findings below may reference images not displayed]

FINDINGS: The heart size and hila are exaggerated by low lung volumes. No
edema or effusion is present. No focal airspace disease is evident.
Degenerative changes are noted at the shoulders.
IMPRESSION: 1. Low lung volumes.
2. No acute cardiopulmonary disease.

## 2021-07-15 ENCOUNTER — Other Ambulatory Visit: Payer: Self-pay | Admitting: Family Medicine

## 2021-07-16 ENCOUNTER — Ambulatory Visit: Payer: Medicaid Other | Admitting: Pharmacist

## 2021-07-22 IMAGING — DX DG CHEST 2V
2 series · 2 of 2 positions shown · non-contrast
Comparison: 05/29/2020.

CLINICAL DATA: Chest pain.

EXAM:
CHEST - 2 VIEW

[w chest lat]
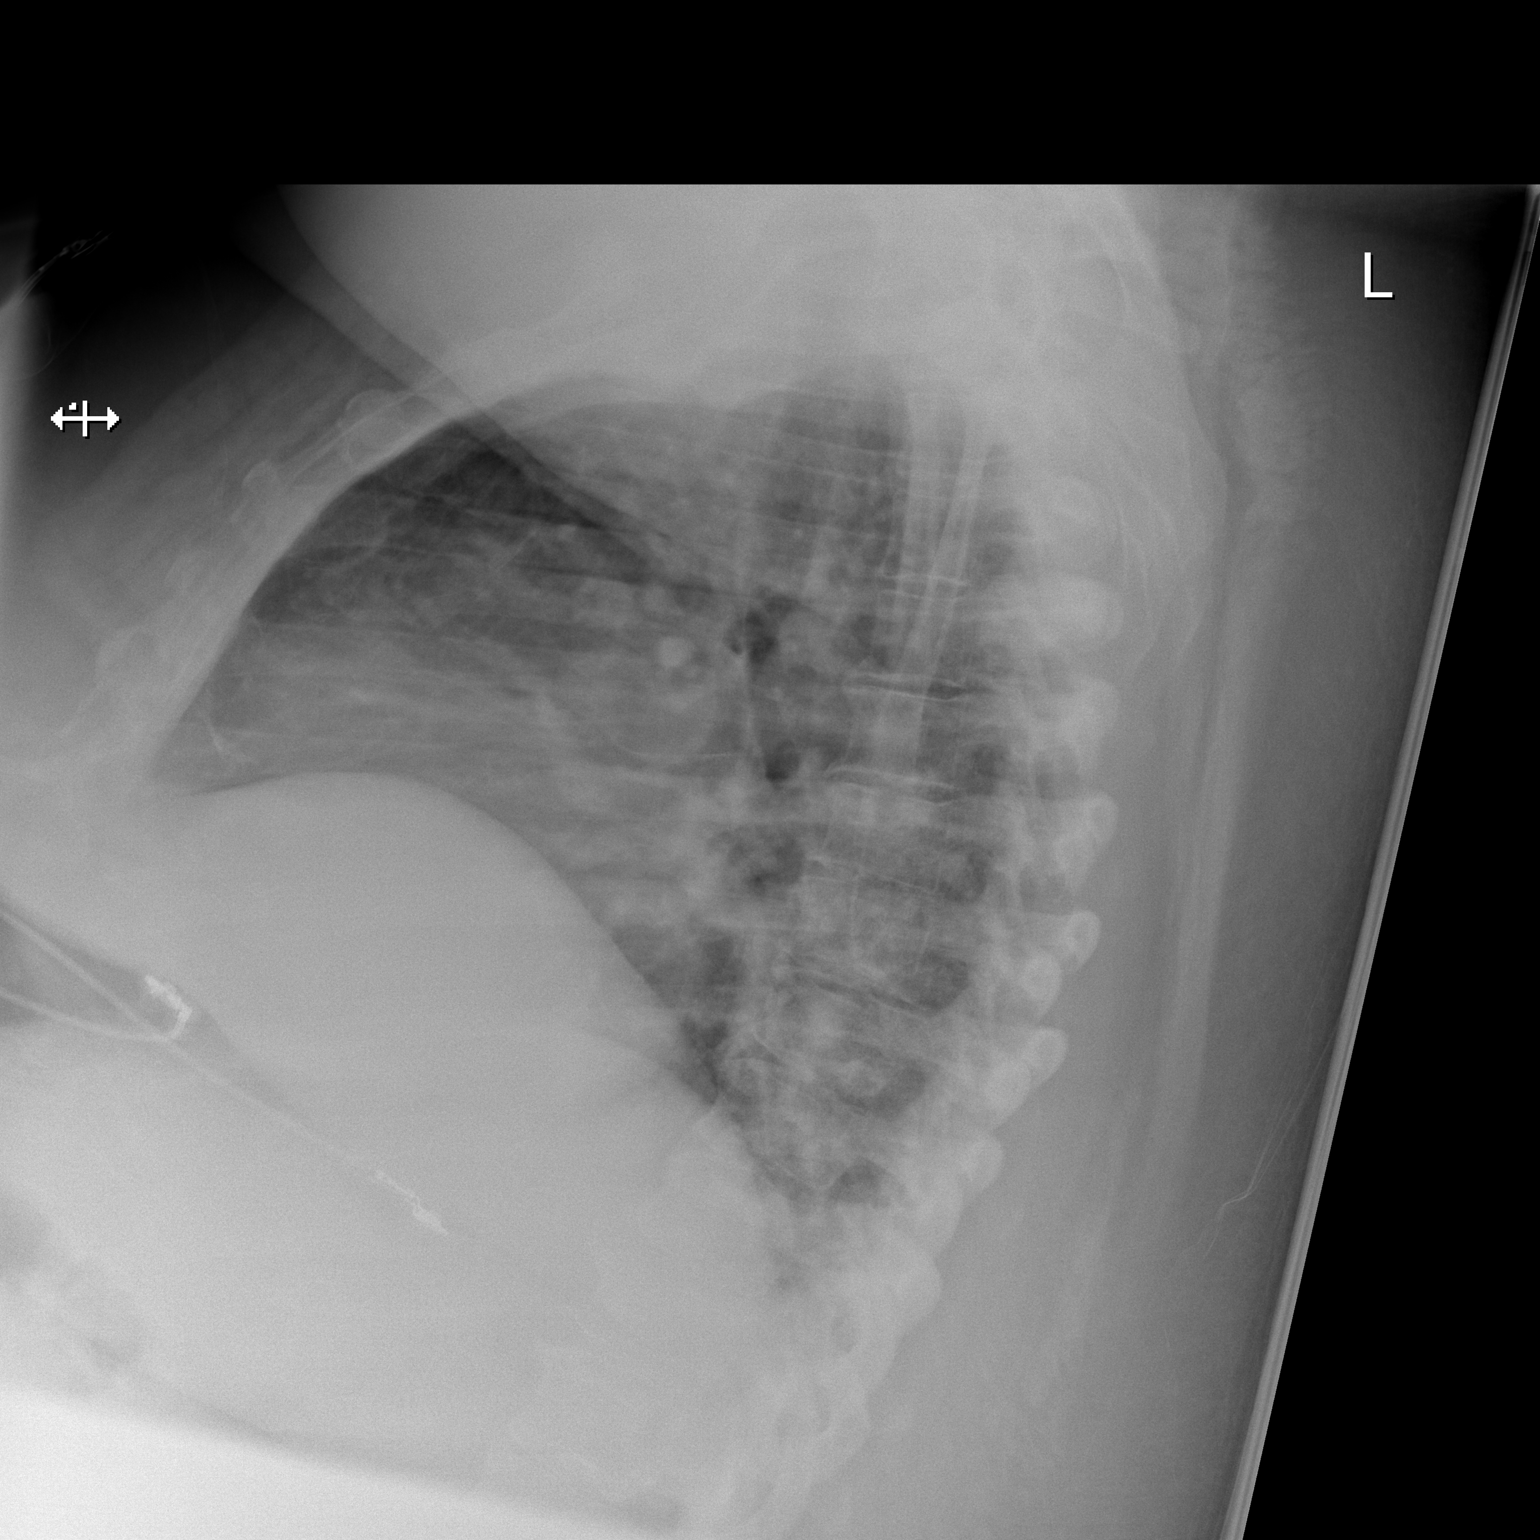

[x chest ap]
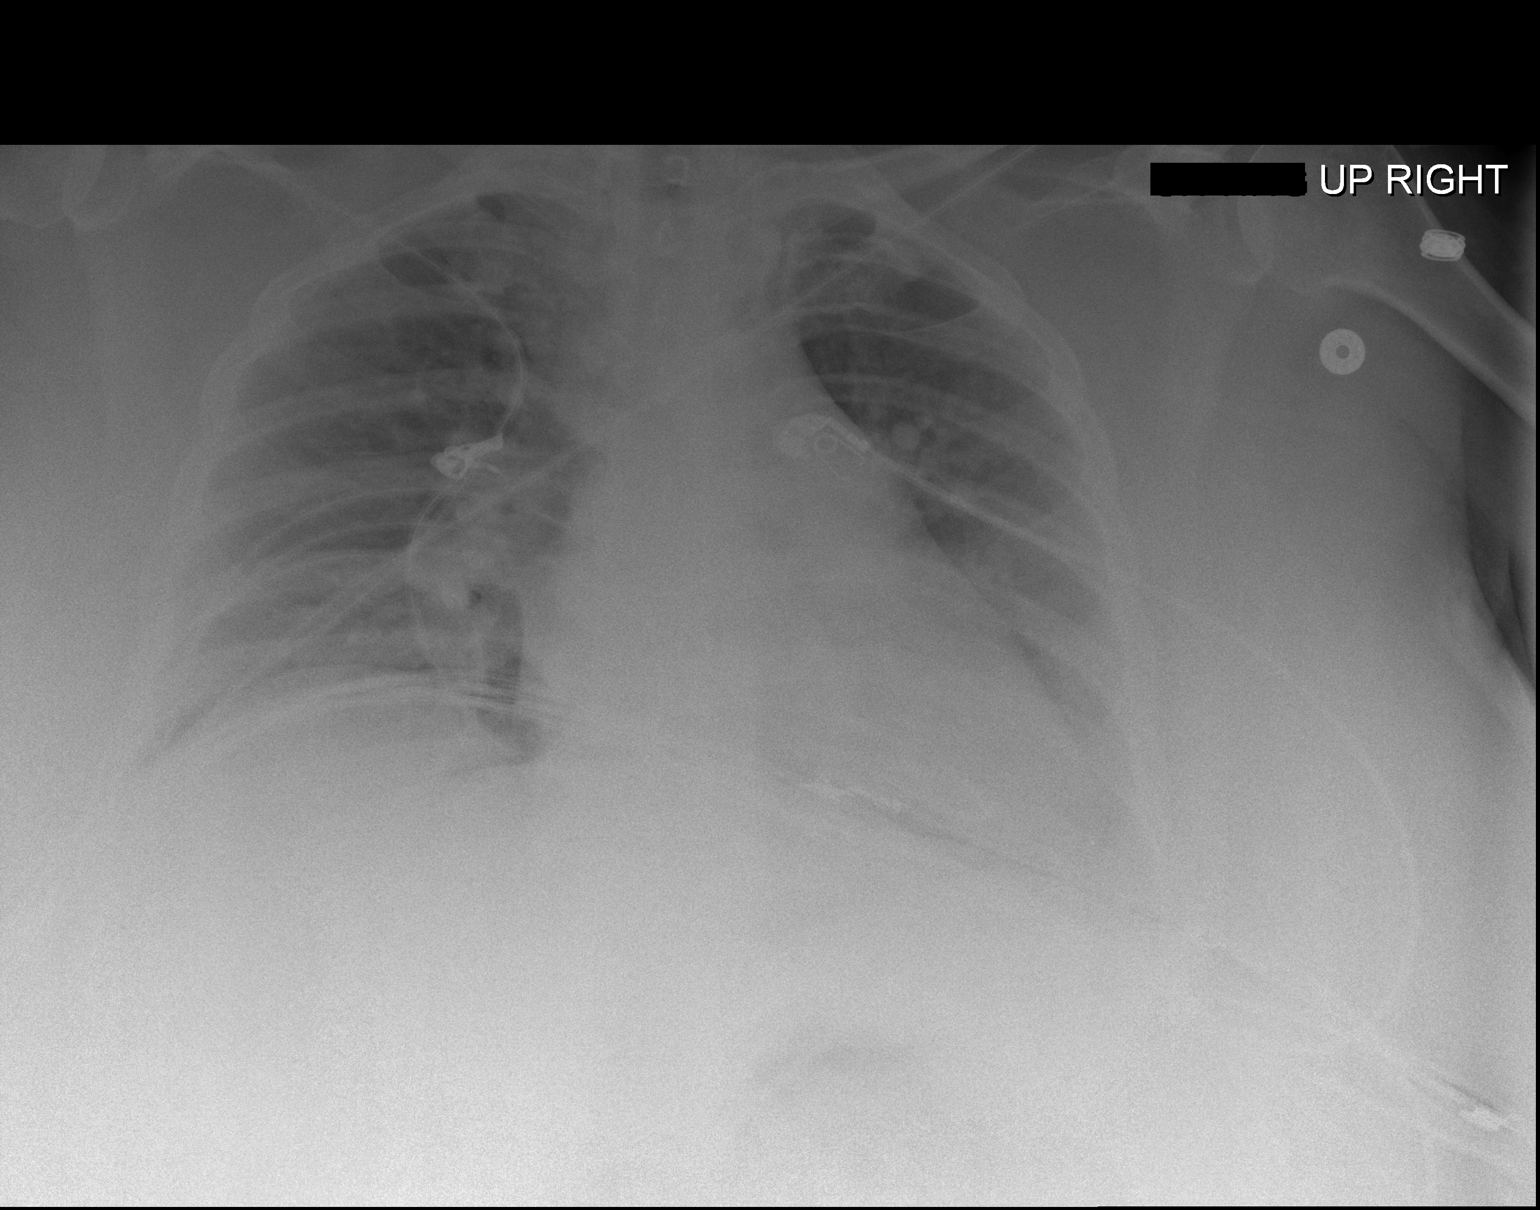

[2 of 2 positions shown; findings below may reference images not displayed]

FINDINGS: Heart size stable. Low lung volumes with bibasilar atelectasis. Tiny
bilateral pleural effusions cannot be excluded. No pneumothorax.
IMPRESSION: Low lung volumes with bibasilar atelectasis. Tiny bilateral pleural
effusions cannot be excluded.

## 2021-07-23 ENCOUNTER — Telehealth: Payer: Self-pay

## 2021-07-23 NOTE — Telephone Encounter (Signed)
Patient calls nurse line asking if PCP received any documentation from PT evaluation for her lift chair.   Did you receive anything? Eval was done on 8/8 per patient.   I have sent a community message to [employer] as well to see if they can fax documentation over  or what next steps will be.

## 2021-07-24 IMAGING — CT CT ANGIO CHEST
2 of 6 series · 18 of 36 positions shown · IV contrast (omnipaque)
Comparison: Chest CT angiogram dated 05/27/2020. CT abdomen dated
12/10/2016.

CLINICAL DATA: Shortness of breath.

EXAM:
CT ANGIOGRAPHY CHEST WITH CONTRAST
TECHNIQUE: Multidetector CT imaging of the chest was performed using the
standard protocol during bolus administration of intravenous
contrast. Multiplanar CT image reconstructions and MIPs were
obtained to evaluate the vascular anatomy.
CONTRAST:  100mL OMNIPAQUE IOHEXOL 350 MG/ML SOLN

[Series 5: thins · axial · 0.83mm/px · z∈[-310,-72]mm · 17 of 268 slices shown]
[im 15/268  lung]
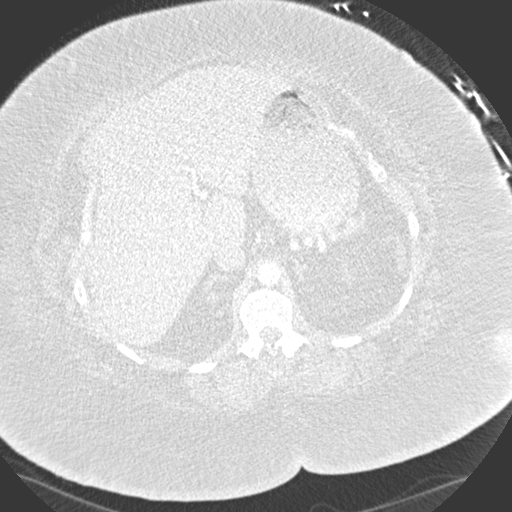
[im 30/268  mediastinal]
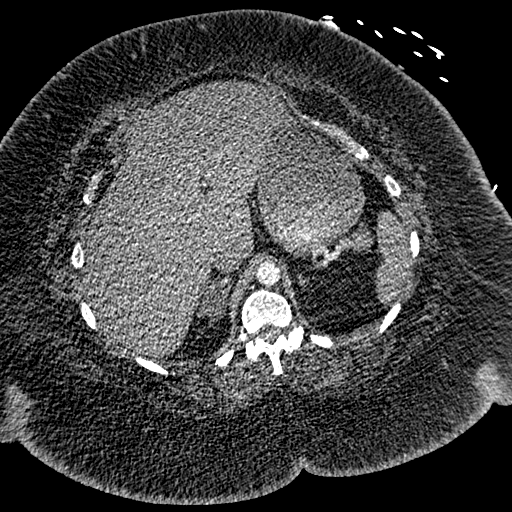
[im 45/268  lung]
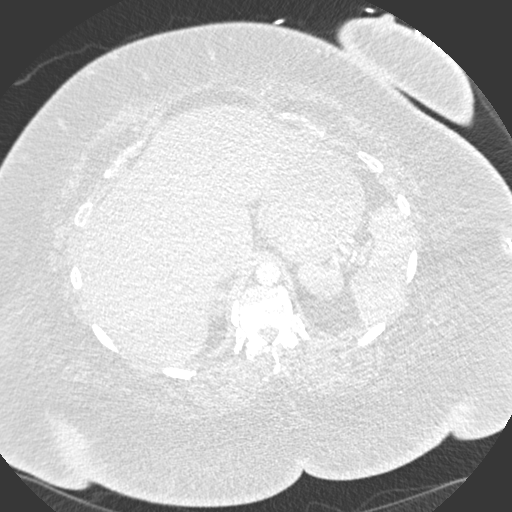
[im 60/268  mediastinal]
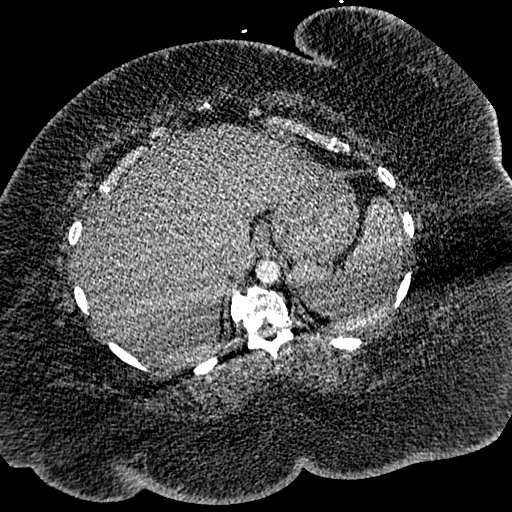
[im 75/268  lung]
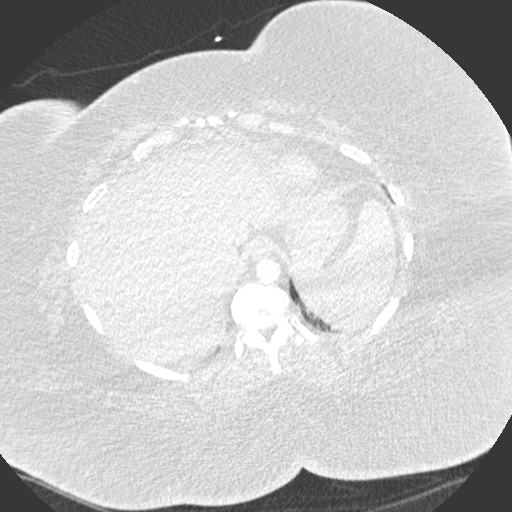
[im 90/268  mediastinal]
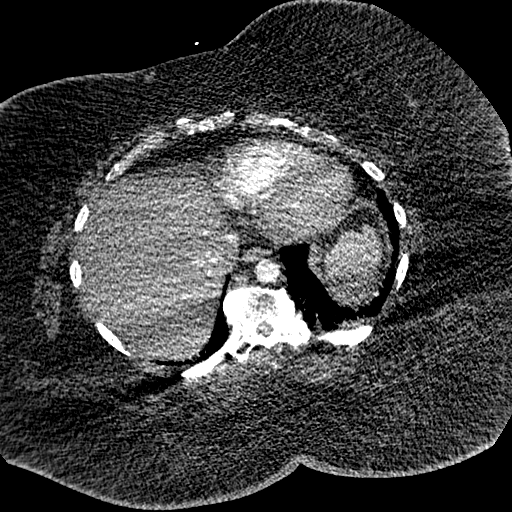
[im 104/268  lung]
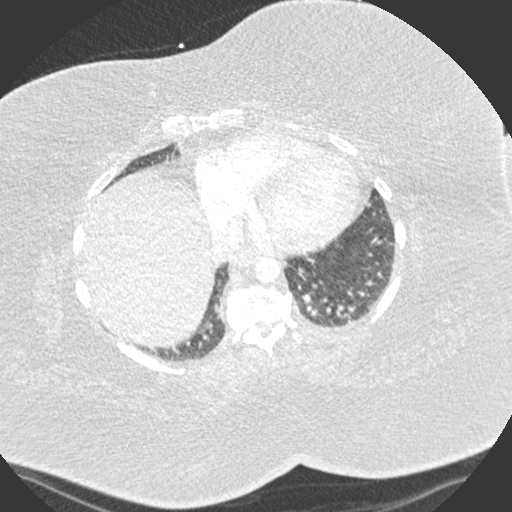
[im 119/268  mediastinal]
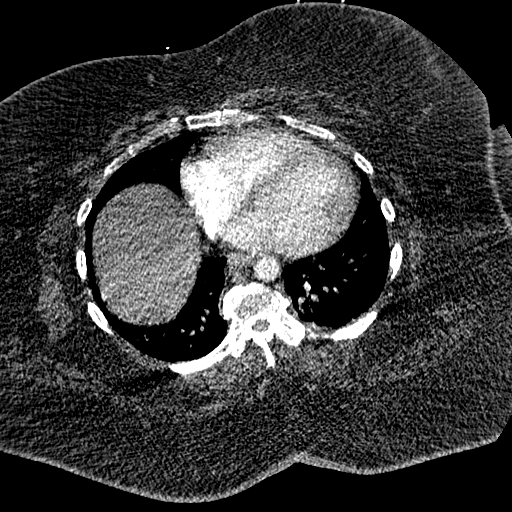
[im 134/268  lung]
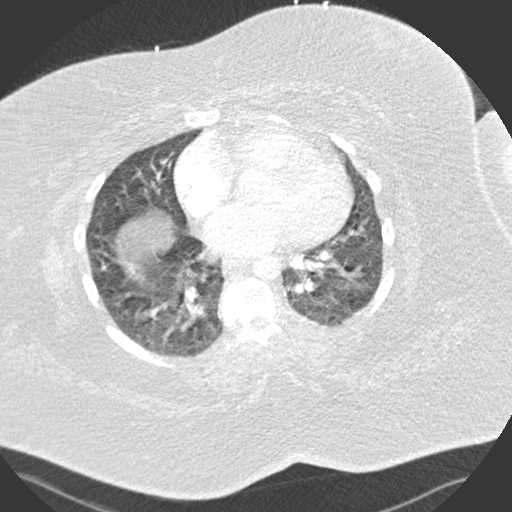
[im 149/268  mediastinal]
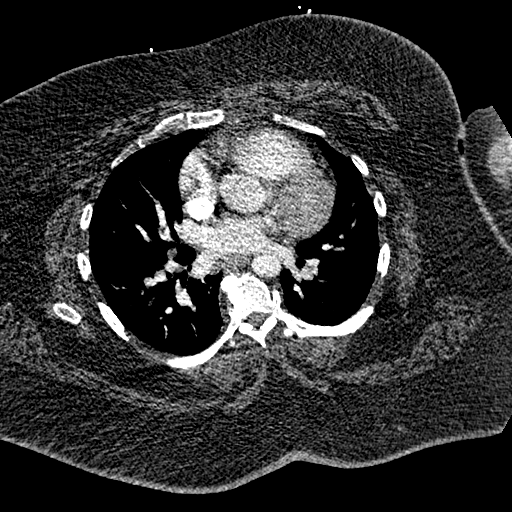
[im 164/268  lung]
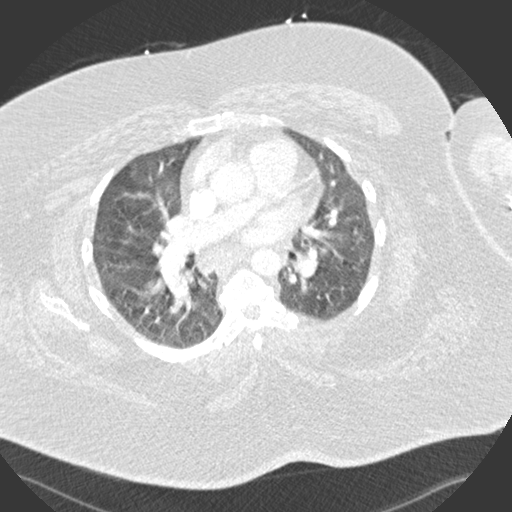
[im 179/268  mediastinal]
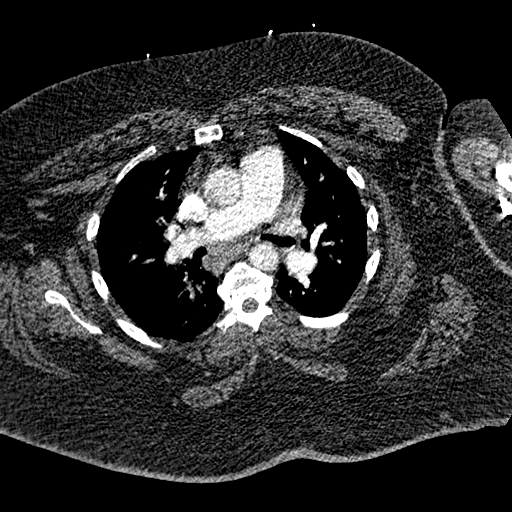
[im 193/268  lung]
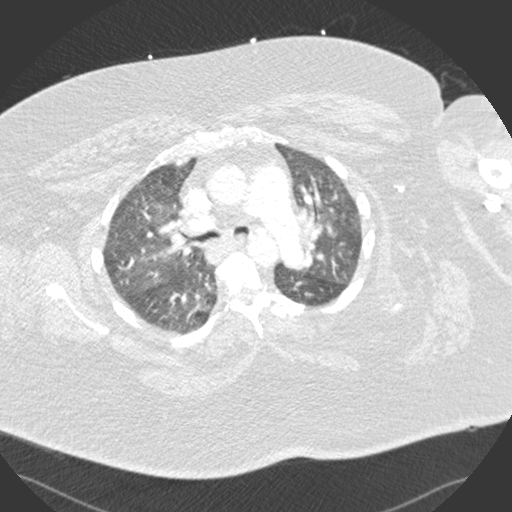
[im 208/268  mediastinal]
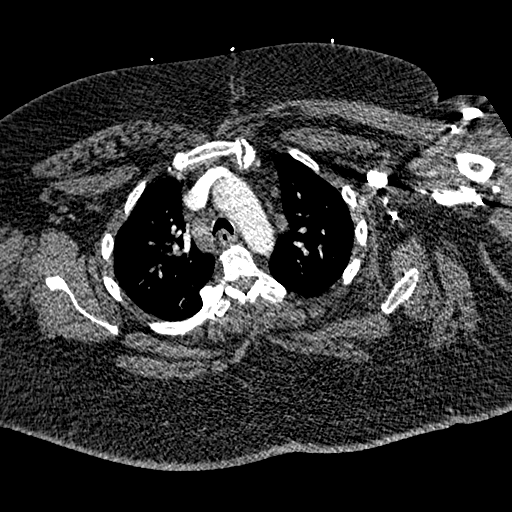
[im 223/268  lung]
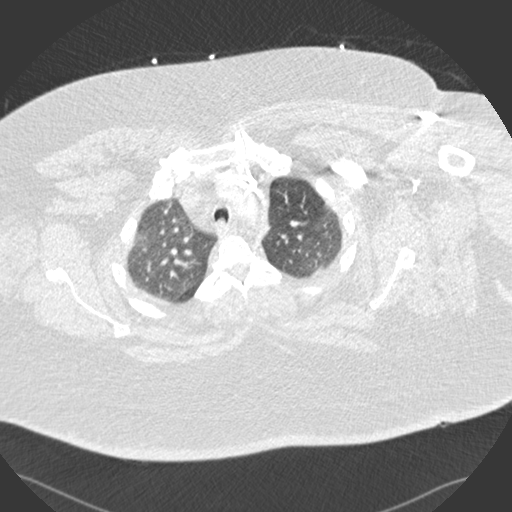
[im 238/268  mediastinal]
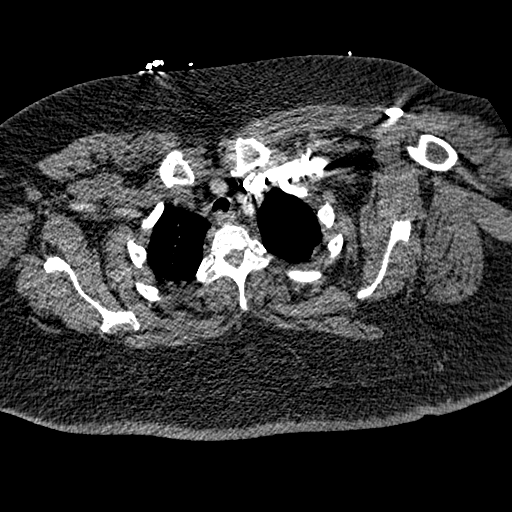
[im 253/268  lung]
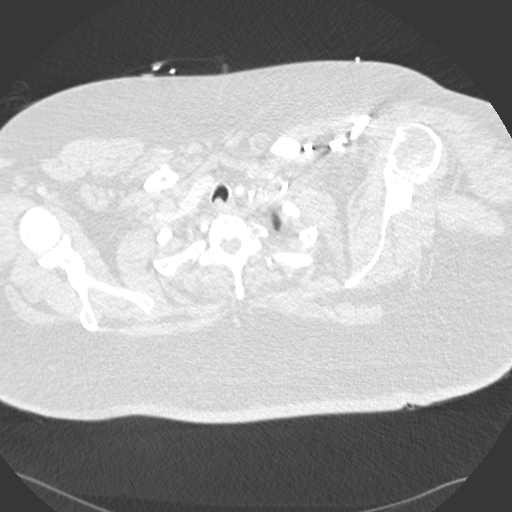

[Series 7: coronal mpr · coronal · 0.57mm/px · 1 of 152 slices shown]
[im 76/152  mediastinal]
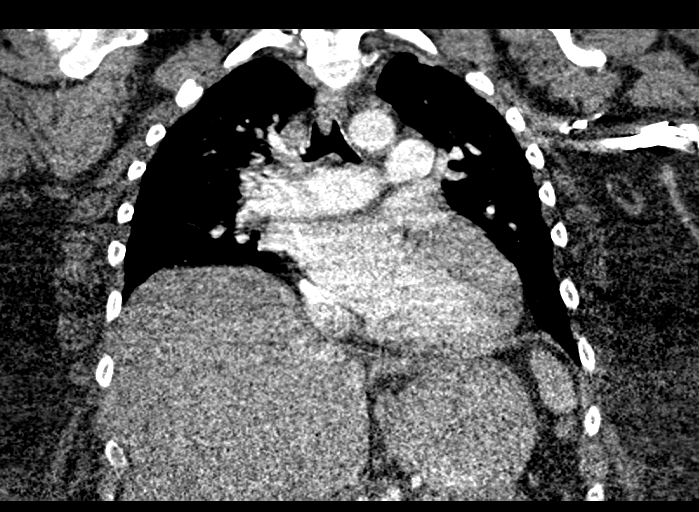

[18 of 36 positions shown; findings below may reference images not displayed]

FINDINGS: Cardiovascular: Evaluation of the pulmonary arteries is
significantly limited by patient motion artifact and body habitus.
Due to these limitations, it is not possible to exclude pulmonary
embolus distal to the bilateral intralobar pulmonary arteries. There
is no pulmonary embolism identified within the main pulmonary
arteries bilaterally.

Heart size is within normal limits. No pericardial effusion. No
thoracic aortic aneurysm.

Mediastinum/Nodes: No mass or enlarged lymph nodes are seen within
the mediastinum. Esophagus is unremarkable. Trachea and central
bronchi are unremarkable.

Lungs/Pleura: Probable mild edema versus air trapping bilaterally.
No consolidation, pleural effusion or pneumothorax.

Upper Abdomen: Limited images of the upper abdomen are provided.
Redemonstration of a RIGHT adrenal adenoma.

Musculoskeletal: No acute or suspicious osseous finding.

Review of the MIP images confirms the above findings.
IMPRESSION: 1. No central obstructing pulmonary embolism is identified. However,
significant study limitations detailed above.
2. Probable mild edema versus air trapping bilaterally. No evidence
of pneumonia.
3. Heart size is within normal limits. No pericardial effusion.

## 2021-07-24 IMAGING — DX DG CHEST 1V PORT
1 series · 1 of 1 positions shown · non-contrast
Comparison: June 12, 2020

CLINICAL DATA: Shortness of breath.

EXAM:
PORTABLE CHEST 1 VIEW

[chest ap]
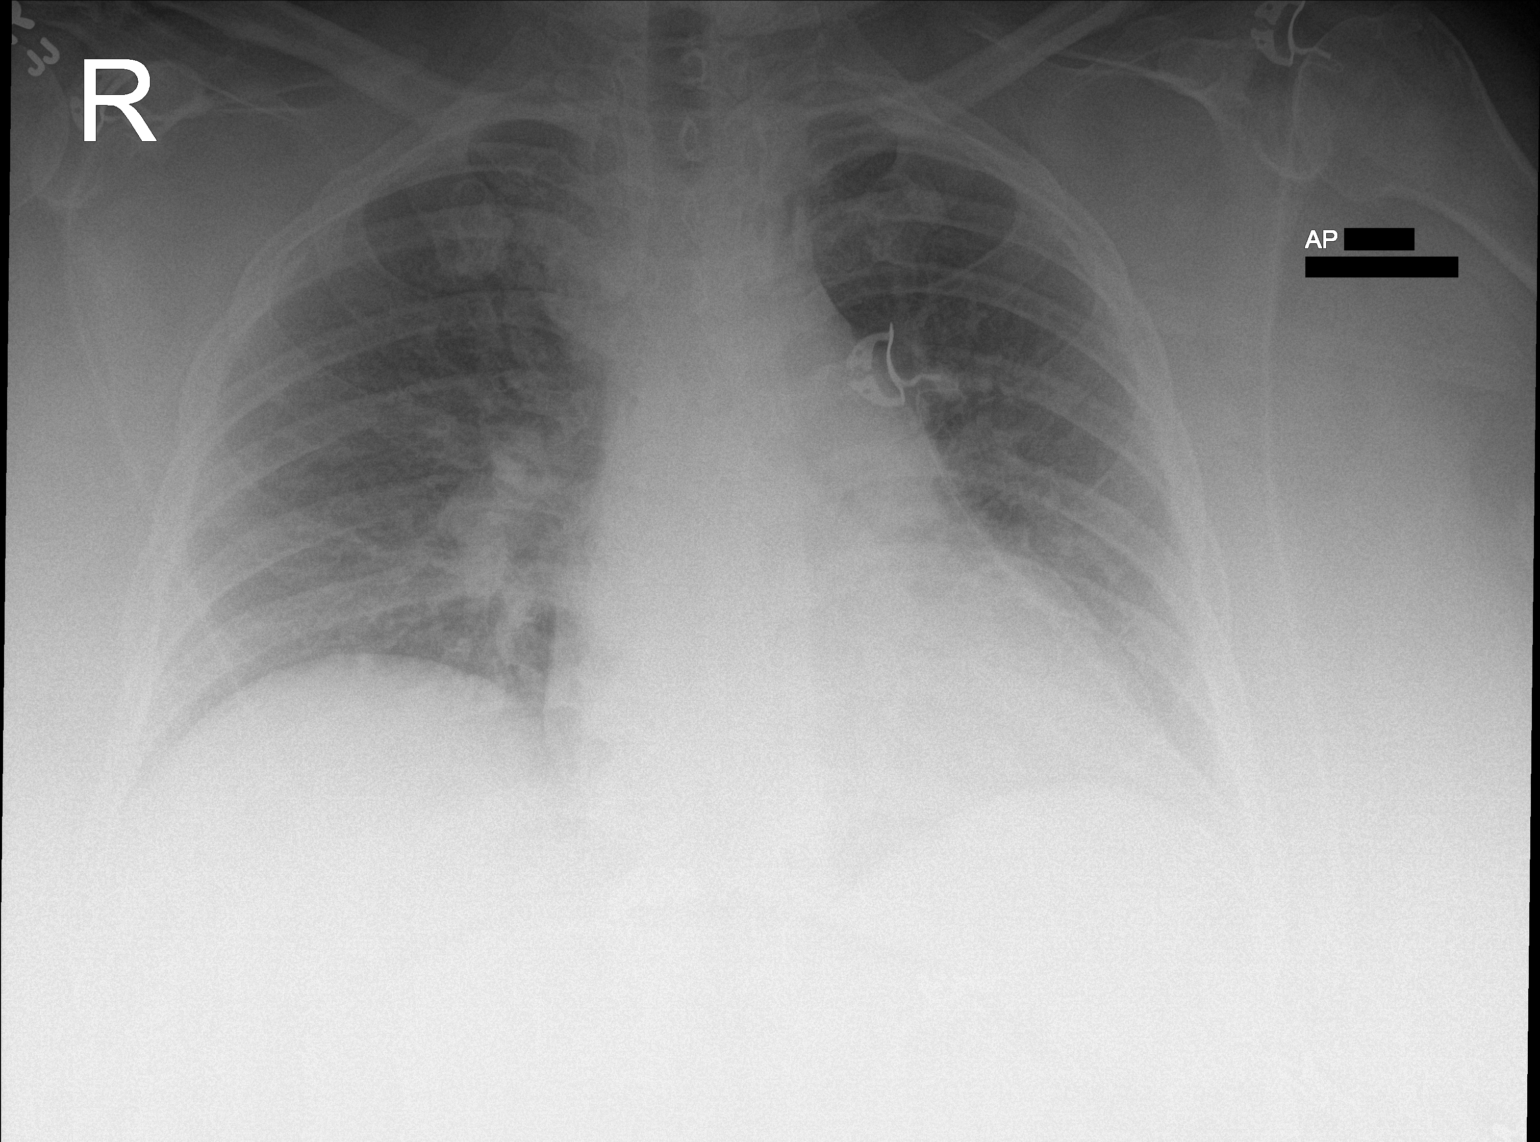

[1 of 1 positions shown; findings below may reference images not displayed]

FINDINGS: Very mildly decreased lung volumes are seen which is likely
secondary to the degree of patient inspiration. Very mild, diffusely
increased lung markings are also seen. There is no evidence of focal
consolidation, pleural effusion or pneumothorax. The heart size and
mediastinal contours are within normal limits. The visualized
skeletal structures are unremarkable.
IMPRESSION: Very mild, diffusely increased lung markings, likely secondary to
the degree of patient inspiration.

## 2021-07-26 NOTE — Telephone Encounter (Signed)
I believe I did receive documentation and signed it to be faxed back. Hopefully [employer] can give [place] the next steps on what needs to be done  Thanks [name], MD

## 2021-07-28 IMAGING — DX DG CHEST 1V PORT
1 series · 2 of 2 positions shown · non-contrast
Comparison: Radiograph and CTA 05/15/2020

CLINICAL DATA: Acute respiratory failure with hypoxia and
hypercapnia.

EXAM:
PORTABLE CHEST 1 VIEW

[Series 1: chest · 0.14mm/px · 2 of 2 slices shown]
[im 1/2]
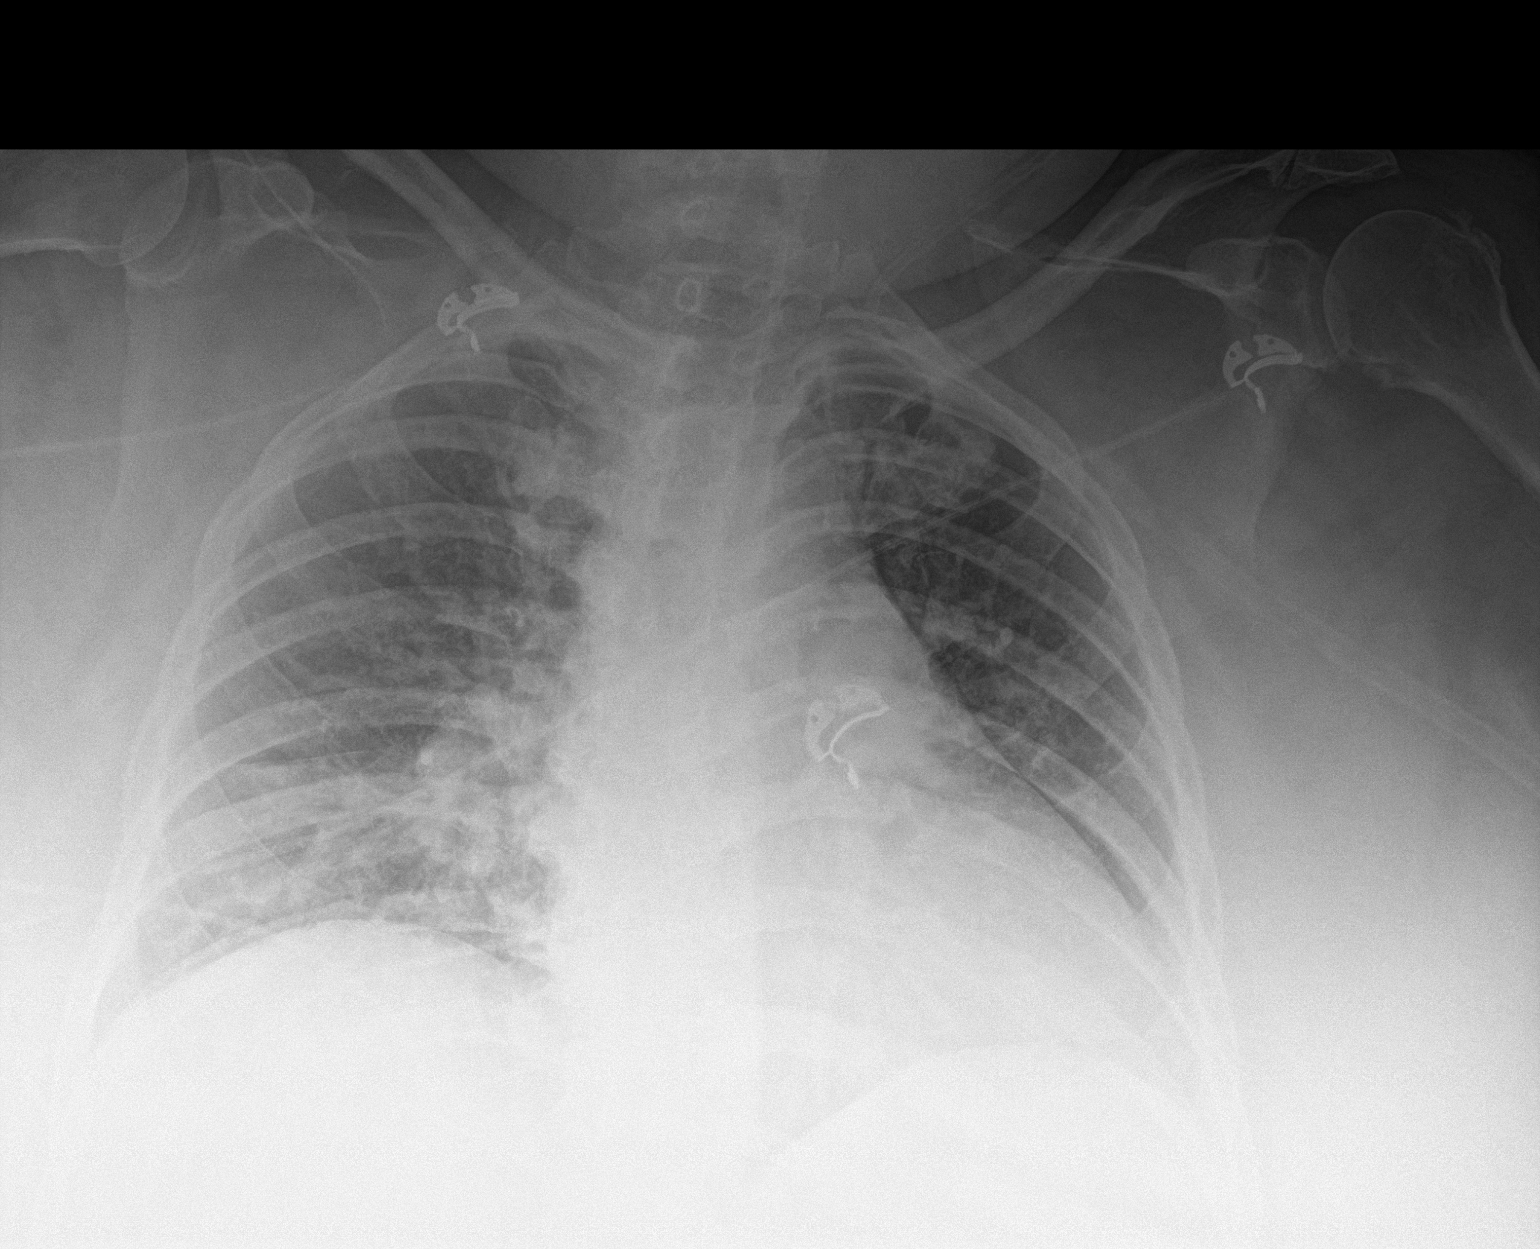
[im 2/2]
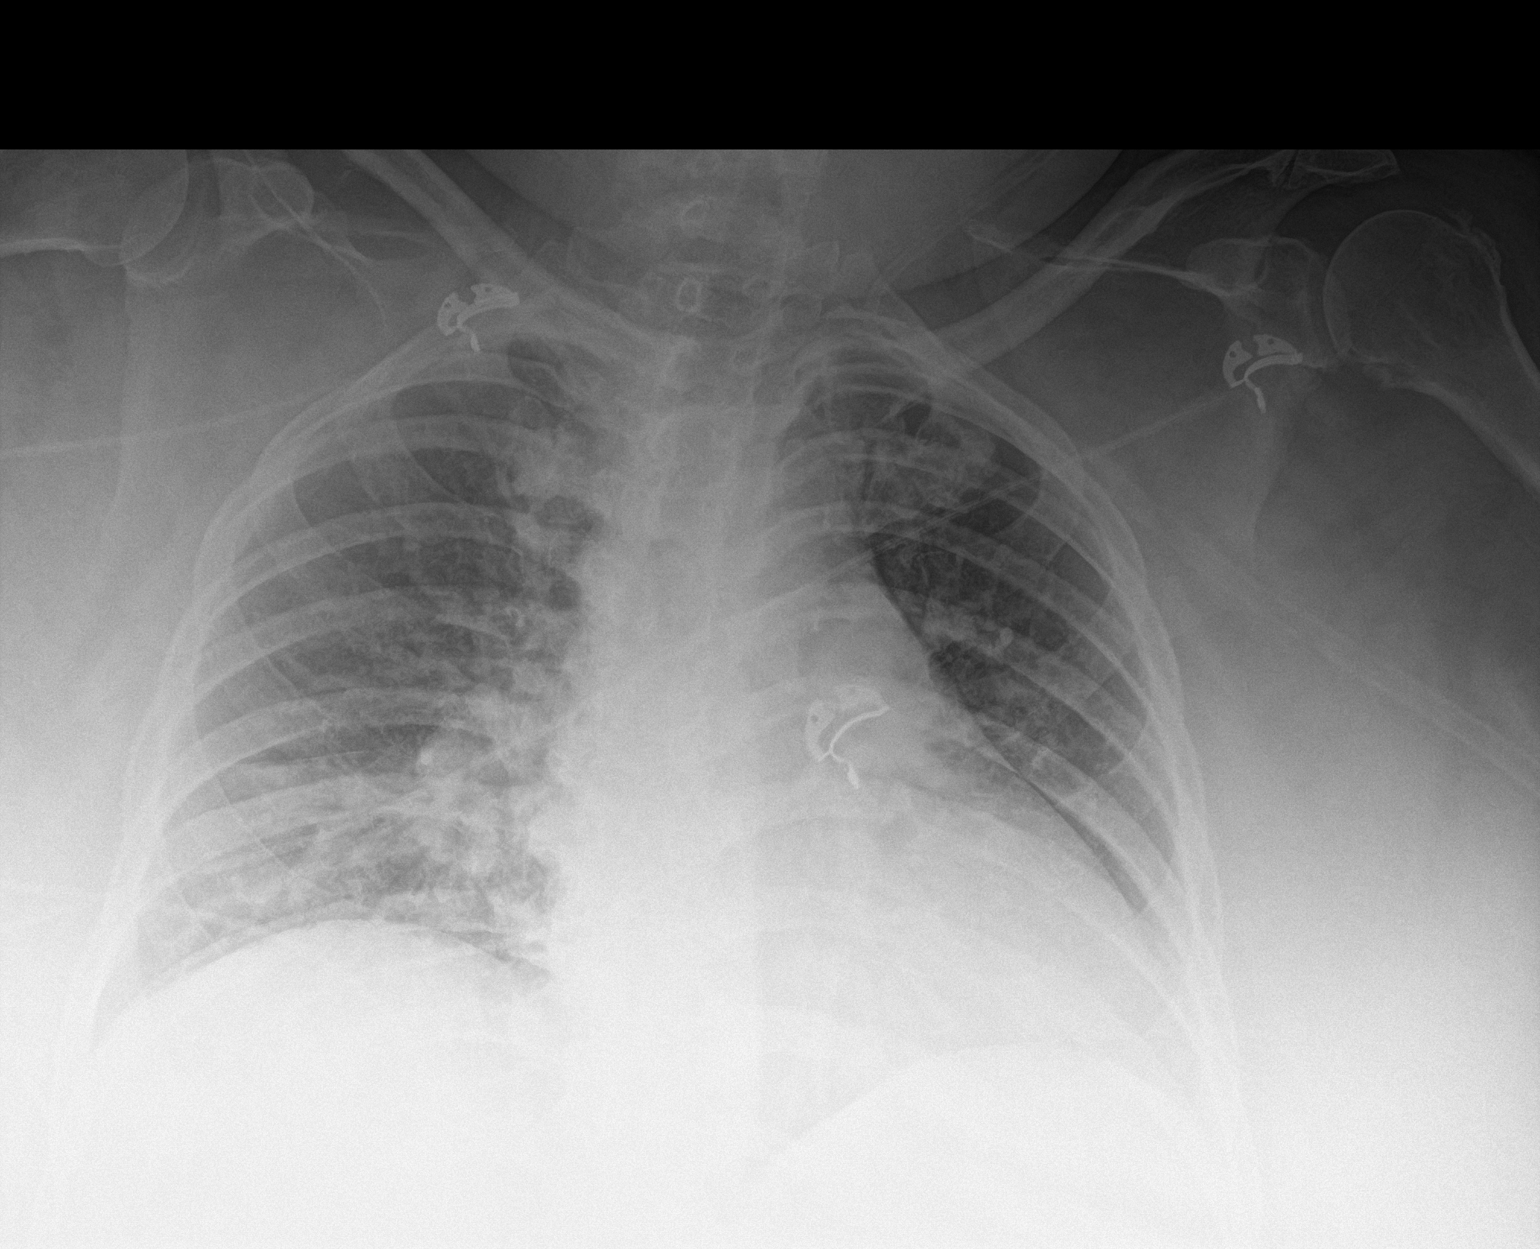

[2 of 2 positions shown; findings below may reference images not displayed]

FINDINGS: Stable heart size and mediastinal contours. There is mild
peribronchial thickening. Increasing basilar atelectasis. No
confluent airspace disease. No pleural fluid or pneumothorax. Stable
osseous structures.
IMPRESSION: Mild peribronchial thickening, with increasing basilar atelectasis.

## 2021-07-30 NOTE — Telephone Encounter (Signed)
Still have not heard a response from [employer]. I have sent a follow-up community message.

## 2021-07-31 NOTE — Telephone Encounter (Signed)
Can you guys contact the patient and ask her if she also needs a hoyer lift? I thought we were just doing the chair lift. If she needs the hoyer lift as well I will add this documentation.  Thanks, [name], MD

## 2021-07-31 NOTE — Telephone Encounter (Signed)
[name], [place] Phone Number: [phone] x [phone]   Dr [name] ordered a hoyer lift for this pt, but she only documented medical necessity for the lift chair the pt requested, not the hoyer lift.   Medicaid needs to know how the pt transfers and how much assistance is needed.  Can you please add this information to the [place] note from 05/28/21?   The criteria for a hoyer lift is  a. The patient's condition is such that periodic movement is necessary to effect improvement or to arrest or retard deterioration in the patient's  condition; and  b. The patient or family is not able to transfer the patient safely.

## 2021-08-03 ENCOUNTER — Other Ambulatory Visit: Payer: Self-pay | Admitting: Family Medicine

## 2021-08-03 ENCOUNTER — Other Ambulatory Visit: Payer: Self-pay

## 2021-08-03 MED ORDER — OXYCODONE-ACETAMINOPHEN 7.5-325 MG PO TABS: 1.0000 | ORAL_TABLET | Freq: Three times a day (TID) | ORAL | 0 refills | Status: DC | PRN

## 2021-08-03 NOTE — Telephone Encounter (Signed)
Okay, thanks.  For documentation purposes regarding the hoyer lift she already received:  It is necessary for patient to move periodically to prevent pressure ulcers. Depending on her respiratory status at any given moment, the patient is not always able to independently transfer herself safely, so the hoyer lift is medically necessary.  [name], MD

## 2021-08-03 NOTE — Telephone Encounter (Signed)
Patient calls nurse line regarding status of lift chair. Patient reports that she only needs the lift chair and that she has only received the hoyer lift.   Patient has requested that PT evaluation be faxed over to [facility], case Freight forwarder. Will fax to provided number at [phone].   Talbot Grumbling, RN

## 2021-08-03 NOTE — Telephone Encounter (Signed)
Patient calls nurse line regarding rx refill. Reports that pharmacy will be closed on Sunday and would like for rx to be sent in by Friday so medication can be delivered to her home.   Please advise.   Talbot Grumbling, RN

## 2021-08-03 NOTE — Telephone Encounter (Signed)
PDMP with appropriate findings. Will refill and request it be delivered 9/2 or 9/3. She has an appointment with me next week.  [name], MD

## 2021-08-04 ENCOUNTER — Ambulatory Visit: Payer: Medicaid Other | Admitting: Family Medicine

## 2021-08-04 LAB — HM DIABETES EYE EXAM

## 2021-08-11 ENCOUNTER — Ambulatory Visit (INDEPENDENT_AMBULATORY_CARE_PROVIDER_SITE_OTHER): Payer: Medicaid Other | Admitting: Family Medicine

## 2021-08-11 ENCOUNTER — Telehealth: Payer: Self-pay

## 2021-08-11 ENCOUNTER — Other Ambulatory Visit: Payer: Self-pay

## 2021-08-11 ENCOUNTER — Other Ambulatory Visit: Payer: Self-pay | Admitting: Family Medicine

## 2021-08-11 VITALS — BP 122/81 | HR 91

## 2021-08-11 DIAGNOSIS — Z7409 Other reduced mobility: Secondary | ICD-10-CM

## 2021-08-11 DIAGNOSIS — IMO0002 Reserved for concepts with insufficient information to code with codable children: Secondary | ICD-10-CM

## 2021-08-11 DIAGNOSIS — E1165 Type 2 diabetes mellitus with hyperglycemia: Secondary | ICD-10-CM

## 2021-08-11 DIAGNOSIS — E1151 Type 2 diabetes mellitus with diabetic peripheral angiopathy without gangrene: Secondary | ICD-10-CM | POA: Diagnosis not present

## 2021-08-11 DIAGNOSIS — G4700 Insomnia, unspecified: Secondary | ICD-10-CM | POA: Diagnosis not present

## 2021-08-11 DIAGNOSIS — G8929 Other chronic pain: Secondary | ICD-10-CM

## 2021-08-11 NOTE — Patient Instructions (Signed)
It was great to see you again today!  Will document for power wheelchair Will refill oxycodone for another month, available to fill on 10/3. Referring to sleep specialist Keep appointment with endocrinology  Follow up with me in 2 months, sooner if needed  Be well, Dr. [name]

## 2021-08-11 NOTE — Progress Notes (Addendum)
  Date of Visit: 08/11/2021   SUBJECTIVE:   HPI:  [name] presents today for routine follow up and face to face power wheelchair evaluation.  Wheelchair eval - completed eval with physical therapy back in July, and they recommended motorized wheelchair. She does not have the strength to propel herself in a mechanical wheelchair.  Chronic pain - last visit changed to oxycodone-acetaminophen 7.5-'325mg'$  three times daily. She is doing much better on this regimen; it helps control pain. Denies adverse side effects. Allows her to participate more in physical therapy. Has appointment in early October with pain management, Dr. Dagoberto Ligas.  Difficulty sleeping - still having trouble with sleeping. Wears bipap every night. Tried ramelteon without any improvement. Agreeable to idea of seeing sleep doctor.  Diabetes - she has an upcoming appointment with endocrinology at [facility] this month; they will be managing her diabetes.   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 Hopefully they will be able to facilitate changes leading to long term improvements in sugars, since we have had difficulty accomplishing this here.  Encounter for chronic pain management Pain improved with change to oxycodone 7.5 component of narcotic regimen. PDMP reviewed with appropriate findings. Benefits of continued therapy outweigh risks. Will provide an additional month refill (to be filled on 10/3). Also has  upcoming pain management appointment - hopefully can find some multimodality pain control options there, with the ultimate goal of enabling her to be more physically active and lose weight.  Impaired mobility I have reviewed and agree with the physical therapy Mobility/Seating Evaluation for patient's power wheelchair. We were not able to weigh patient today, but her last weight here at the [facility] one month ago was 368 lb 3.2 oz (167 kg). Patient requires a power wheelchair in order to complete ADLs like toileting, grooming, preparation of food, and basic mobility around her home.  Patient has the mental and physical ability to use a power wheelchair in her home.   Insomnia No improvement with ramelteon. 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 I think at this point patient would best be served by seeing a sleep specialist. Have placed referral to sleep medicine.  FOLLOW UP: Follow up in 2 mos for above issues Referring to sleep medicine  [name], [facility]  Addendum 9/25 Patient also needs new liner and shrinkers for her below the knee amputation. These are medically necessary in order to use her prosthesis. Will fax rx to [facility].

## 2021-08-11 NOTE — Telephone Encounter (Signed)
Andria Rhein with [employer] calls nurse line to have the following documentation in today's office note.   I have tried to send paperwork for the appointment for [name] but am unable to get the fax to transmit. I spoke with your office and they confirmed the fax has been out of service.   Can you please request Dr. Dorathy Daft include the following in today's office note:   1 -MD is seeing patient for a Power Wheelchair Face to Face  2-MD has reviewed and agrees with PT Mobility/Seating Evaluation for Power Wheelchair  3-Include patient's weight in the note  4-Include ADL's that patient needs the power wheelchair for in the home  5-Document patient has mental and physical ability to use power wheelchair in the home     Please let me know if you receive the forms I sent for signature or I am happy to resend whenever the fax is operational again.   Thank you and please let me know if you have any questions.   [place]  [phone] Cell

## 2021-08-12 MED ORDER — OXYCODONE-ACETAMINOPHEN 7.5-325 MG PO TABS: 1.0000 | ORAL_TABLET | Freq: Three times a day (TID) | ORAL | 0 refills | Status: DC | PRN

## 2021-08-12 NOTE — Assessment & Plan Note (Signed)
>>  ASSESSMENT AND PLAN FOR INSULIN DEPENDENT TYPE 2 DIABETES MELLITUS ([name]) WRITTEN ON 08/12/2021  4:36 PM BY Paityn Balsam, [name], MD  Has upcoming appointment with [facility] endocrinology to manage her diabetes. Hopefully they will be able to facilitate changes leading to long term improvements in sugars, since we have had difficulty accomplishing this here.

## 2021-08-12 NOTE — Telephone Encounter (Signed)
Completed my note from yesterday's visit and included this requested documentation. I do not have any forms to sign in my inbox regarding her wheelchair. Please ask the [employer] rep to resend any forms I need to sign.  I did receive a letter about needing medical documentation regarding her oxygen, which I have placed in the [facility] RN inbox.  Thanks, [name], MD

## 2021-08-12 NOTE — Assessment & Plan Note (Signed)
I have reviewed and agree with the physical therapy Mobility/Seating Evaluation for patient's power wheelchair. We were not able to weigh patient today, but her last weight here at the [facility] one month ago was 368 lb 3.2 oz (167 kg). Patient requires a power wheelchair in order to complete ADLs like toileting, grooming, preparation of food, and basic mobility around her home.  Patient has the mental and physical ability to use a power wheelchair in her home.

## 2021-08-12 NOTE — Assessment & Plan Note (Signed)
Pain improved with change to oxycodone 7.5 component of narcotic regimen. PDMP reviewed with appropriate findings. Benefits of continued therapy outweigh risks. Will provide an additional month refill (to be filled on 10/3). Also has upcoming pain management appointment - hopefully can find some multimodality pain control options there, with the ultimate goal of enabling her to be more physically active and lose weight.

## 2021-08-12 NOTE — Assessment & Plan Note (Signed)
No improvement with ramelteon. 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 I think at this point patient would best be served by seeing a sleep specialist. Have placed referral to sleep medicine.

## 2021-08-12 NOTE — Assessment & Plan Note (Signed)
Has upcoming appointment with [facility] endocrinology to manage her diabetes. Hopefully they will be able to facilitate changes leading to long term improvements in sugars, since we have had difficulty accomplishing this here.

## 2021-08-13 NOTE — Telephone Encounter (Signed)
Faxed paperwork for oxygen to [employer].   Talbot Grumbling, RN

## 2021-08-20 ENCOUNTER — Ambulatory Visit: Payer: Medicaid Other | Admitting: Pharmacist

## 2021-08-24 ENCOUNTER — Other Ambulatory Visit: Payer: Self-pay

## 2021-08-24 ENCOUNTER — Ambulatory Visit (HOSPITAL_COMMUNITY)
Admission: EM | Admit: 2021-08-24 | Discharge: 2021-08-24 | Disposition: A | Discharge status: Home / Self Care | Payer: Medicaid Other | Attending: Family | Admitting: Family

## 2021-08-24 DIAGNOSIS — R45851 Suicidal ideations: Secondary | ICD-10-CM | POA: Insufficient documentation

## 2021-08-24 DIAGNOSIS — F332 Major depressive disorder, recurrent severe without psychotic features: Secondary | ICD-10-CM

## 2021-08-24 DIAGNOSIS — F319 Bipolar disorder, unspecified: Secondary | ICD-10-CM | POA: Insufficient documentation

## 2021-08-24 NOTE — Discharge Instructions (Signed)
Take all medications as prescribed. Keep all follow-up appointments as scheduled.  Do not consume alcohol or use illegal drugs while on prescription medications. Report any adverse effects from your medications to your primary care provider promptly.  In the event of recurrent symptoms or worsening symptoms, call 911, a crisis hotline, or go to the nearest emergency department for evaluation.   

## 2021-08-24 NOTE — BH Assessment (Signed)
[name] ROUTINE: Patient is a 45 year old that presents this date voluntary with ongoing anxiety and depression. Patient is recently divorced and states this date that she "can't let it go" referring to the divorce. Patient states "the divorced wasn't my idea" and she reports in the last few weeks she has been suffering from excessive anxiety and is requesting assistance with counseling and possible medication management. Patient denies any SA issues and is not currently receiving any services from a psychiatric provider. Patient denies any S/I, H/I or AVH.

## 2021-08-24 NOTE — ED Provider Notes (Addendum)
Behavioral Health Urgent Care Medical Screening Exam  Patient Name: Lakai Trevillion MRN: [id number] Date of Evaluation: 08/24/21 Chief Complaint:   Diagnosis:  Final diagnoses:  Severe episode of recurrent major depressive disorder, without psychotic features ([place])    History of Present illness:   [name], 45 y.o., female patient seen face to face by this provider, consulted with Dr. Serafina Mitchell; and chart reviewed on 08/24/21.  On evaluation Lillain Seaver reports  Fradel Prideaux is a 45 y.o. female presents accompanied with her daughter requesting services for she and her daughter. Ambreen reported a history of depression bipolar disorder and anxiety.  States she was taking medication in the past however has not had follow-up in "quite some time".  Patient reports she was recently separated from her husband which they are planning to go through divorce.  Coupled with strained relationship between she and her 52 year old daughter.  She reports passive suicidal ideations that are intermittent.  Denied plan or intent.  States she is seeking therapy and psychiatrist to get reestablished with care.  Reports she is will be followed by pain management due to chronic pain.  Francina was provided with additional outpatient resources for follow-up care.  Consider family therapy session.   During evaluation Caprecia Dewberry is sitting in a wheelchair in no acute distress. He  is alert/oriented x 4; calm/cooperative; and mood congruent with affect.  He is speaking in a clear tone at moderate volume, and normal pace; with good eye contact.  His thought process is coherent and relevant; There is no indication that she  is currently responding to internal/external stimuli or experiencing delusional thought content; and she has denied suicidal/self-harm/homicidal ideation, psychosis, and paranoia.   Patient has remained calm throughout assessment and has answered questions appropriately.    At this time Barba Rekowski is educated  and verbalizes understanding of mental health resources and other crisis services in the community. She is instructed to call 911 and present to the nearest emergency room should she experience any suicidal/homicidal ideation, auditory/visual/hallucinations, or detrimental worsening of her mental health condition. She was a also advised by Probation officer that she could call the toll-free phone on insurance card to assist with identifying in network counselors and agencies or number on back of Medicaid card to speak with care coordinator.     Psychiatric Specialty Exam  Presentation  General Appearance:Appropriate for Environment Eye Contact:Good Speech:Clear and Coherent Speech Volume:Normal Handedness:Right  Mood and Affect  Mood: Anxious Affect: Congruent  Thought Process  Thought Processes: Coherent Descriptions of Associations:Intact Orientation:Full (Time, Place and Person) Thought Content:Logical   Hallucinations:None Ideas of Reference:None Suicidal Thoughts:Yes, Passive Without Intent; Without Plan Homicidal Thoughts:No  Sensorium  Memory: Immediate Good; Recent Good Judgment: Fair Insight: Fair  Materials engineer: Fair Attention Span: Good Recall: Good Fund of Knowledge: Good Language: Good  Psychomotor Activity  Psychomotor Activity: Normal  Assets  Assets: Desire for Improvement; Communication Skills  Sleep  Sleep: Fair Number of hours:  No data recorded  Nutritional Assessment (For OBS and FBC admissions only) Has the patient had a weight loss or gain of 10 pounds or more in the last 3 months?: No Has the patient had a decrease in food intake/or appetite?: No Does the patient have dental problems?: No Does the patient have eating habits or behaviors that may be indicators of an eating disorder including binging or inducing vomiting?: No Has the patient recently lost weight without trying?: 0 Has the patient been eating poorly  because of  a decreased appetite?: 0 Malnutrition Screening Tool Score: 0   Physical Exam: Physical Exam Vitals and nursing note reviewed.  HENT:     Head: Normocephalic.  Cardiovascular:     Rate and Rhythm: Normal rate.  Pulmonary:     Effort: Pulmonary effort is normal.  Neurological:     Mental Status: She is alert and oriented to person, place, and time.  Psychiatric:        Mood and Affect: Mood normal.        Behavior: Behavior normal.        Thought Content: Thought content normal.   Review of Systems  Constitutional: Negative.   Eyes: Negative.   Respiratory: Negative.    Cardiovascular: Negative.   Genitourinary: Negative.   Psychiatric/Behavioral:  Positive for depression. Negative for suicidal ideas (passive ideaions). The patient is nervous/anxious and has insomnia.   All other systems reviewed and are negative.  Blood pressure (!) 143/75, pulse 88, temperature 98.3 F (36.8 C), temperature source Oral, resp. rate 16, SpO2 95 %. There is no height or weight on file to calculate BMI.  Musculoskeletal: Strength & Muscle Tone: within normal limits Gait & Station: normal Patient leans: N/A   [place] MSE Discharge Disposition for Follow up and Recommendations: Based on my evaluation the patient does not appear to have an emergency medical condition and can be discharged with resources and follow up care in outpatient services for Medication Management, Partial Hospitalization Program, Individual Therapy, and Group Therapy   [place], NP 08/24/2021, 4:42 PM

## 2021-08-24 NOTE — ED Notes (Signed)
Discharge instructions provided and Pt stated understanding. Pt alert, orient and ambulatory prior to d/c from facility. Personal belongings returned from the orange locker. Safety maintained.

## 2021-08-27 ENCOUNTER — Telehealth: Payer: Self-pay

## 2021-08-27 NOTE — Telephone Encounter (Signed)
Patient calls nurse line requesting prescription to be sent to the [facility] clinic for new liner and shrinkers. Rx will be needed and last OV note addended to reflect the need for the liners and shrinkers.   Fax number is [phone].   Talbot Grumbling, RN

## 2021-08-29 ENCOUNTER — Other Ambulatory Visit: Payer: Self-pay | Admitting: Family Medicine

## 2021-08-29 DIAGNOSIS — IMO0002 Reserved for concepts with insufficient information to code with codable children: Secondary | ICD-10-CM

## 2021-08-29 DIAGNOSIS — E1151 Type 2 diabetes mellitus with diabetic peripheral angiopathy without gangrene: Secondary | ICD-10-CM

## 2021-08-30 NOTE — Telephone Encounter (Signed)
Note addended with necessary info and printed Hand written rx completed, will place all this in to-fax pile to be sent to [facility] at patient's request.  [name], MD

## 2021-08-31 ENCOUNTER — Other Ambulatory Visit: Payer: Self-pay | Admitting: Pharmacist

## 2021-08-31 DIAGNOSIS — E1165 Type 2 diabetes mellitus with hyperglycemia: Secondary | ICD-10-CM

## 2021-08-31 DIAGNOSIS — IMO0002 Reserved for concepts with insufficient information to code with codable children: Secondary | ICD-10-CM

## 2021-09-03 ENCOUNTER — Telehealth: Payer: Medicaid Other

## 2021-09-03 NOTE — Telephone Encounter (Signed)
Patient calls nurse line requesting home health PT services. Patient has an order for this already, as of 06/2021. Patient reports she does not want to use any of the agencies she has in the past. Will forward to referral coordinator.

## 2021-09-04 NOTE — Telephone Encounter (Signed)
Please see mychart message I sent to patient.  Due to her medicaid, options for home health coverage is limited. With her wanting to not return to specific companies this will not allow for [place] to send to many places.  She would benefit from staying with her current agency, [employer], or can do outpatient services.  I advised her to discuss her specific concerns about the agency with you at her next visit.    Thanks [employer]

## 2021-09-08 ENCOUNTER — Encounter: Payer: Self-pay | Admitting: Family Medicine

## 2021-09-09 ENCOUNTER — Encounter: Payer: Medicaid Other | Admitting: Physical Medicine and Rehabilitation

## 2021-09-14 NOTE — Telephone Encounter (Signed)
Patient calls nurse line regarding UTI symptoms. Patient reports that she is having painful urination and cloudy urine. Denies fever.   Please advise.   Talbot Grumbling, RN

## 2021-09-25 NOTE — Telephone Encounter (Signed)
Attempted to reach patient via phone, no answer and unable to leave VM Will try to call next week [name], MD

## 2021-09-29 ENCOUNTER — Other Ambulatory Visit: Payer: Self-pay

## 2021-09-29 ENCOUNTER — Encounter: Payer: Self-pay | Admitting: Family Medicine

## 2021-09-29 ENCOUNTER — Ambulatory Visit (INDEPENDENT_AMBULATORY_CARE_PROVIDER_SITE_OTHER): Payer: Medicaid Other | Admitting: Family Medicine

## 2021-09-29 VITALS — BP 124/67 | HR 85 | Ht 62.0 in | Wt 349.0 lb

## 2021-09-29 DIAGNOSIS — G8929 Other chronic pain: Secondary | ICD-10-CM

## 2021-09-29 DIAGNOSIS — E1165 Type 2 diabetes mellitus with hyperglycemia: Secondary | ICD-10-CM

## 2021-09-29 DIAGNOSIS — F39 Unspecified mood [affective] disorder: Secondary | ICD-10-CM | POA: Diagnosis not present

## 2021-09-29 DIAGNOSIS — Z7409 Other reduced mobility: Secondary | ICD-10-CM | POA: Diagnosis not present

## 2021-09-29 DIAGNOSIS — E1149 Type 2 diabetes mellitus with other diabetic neurological complication: Secondary | ICD-10-CM

## 2021-09-29 LAB — POCT URINALYSIS DIP (MANUAL ENTRY)
Bilirubin, UA: NEGATIVE
Glucose, UA: >=1000 mg/dL — AB
Ketones, POC UA: NEGATIVE mg/dL
Leukocytes, UA: NEGATIVE
Nitrite, UA: NEGATIVE
Protein Ur, POC: NEGATIVE mg/dL
Spec Grav, UA: <=1.005 — AB (ref 1.010–1.025)
Urobilinogen, UA: 0.2 E.U./dL
pH, UA: 5 (ref 5.0–8.0)

## 2021-09-29 LAB — POCT UA - MICROSCOPIC ONLY

## 2021-09-29 LAB — POCT GLYCOSYLATED HEMOGLOBIN (HGB A1C): HbA1c POC (<> result, manual entry): >15 % (ref 4.0–5.6)

## 2021-09-29 MED ORDER — OXYCODONE-ACETAMINOPHEN 7.5-325 MG PO TABS: 1.0000 | ORAL_TABLET | Freq: Three times a day (TID) | ORAL | 0 refills | Status: DC | PRN

## 2021-09-29 NOTE — Patient Instructions (Signed)
Go up to 105u twice daily of your tresiba Checking urine for signs of infection Stomach spot looks ok - not infected right now.  Refilled pain medications for 2 months  Will look into why motorized wheelchair was denied  Please go to behavioral health this Friday to be assessed.  Follow up with me in 1 month, sooner if needed  Be well, Dr. [name]

## 2021-09-29 NOTE — Progress Notes (Signed)
[email or web address]

## 2021-09-30 ENCOUNTER — Other Ambulatory Visit: Payer: Self-pay | Admitting: Family Medicine

## 2021-10-04 LAB — URINE CULTURE

## 2021-10-05 ENCOUNTER — Other Ambulatory Visit: Payer: Self-pay | Admitting: Family Medicine

## 2021-10-06 ENCOUNTER — Other Ambulatory Visit (HOSPITAL_COMMUNITY): Payer: Self-pay | Admitting: Student

## 2021-10-06 ENCOUNTER — Other Ambulatory Visit (HOSPITAL_BASED_OUTPATIENT_CLINIC_OR_DEPARTMENT_OTHER): Payer: Self-pay | Admitting: Student

## 2021-10-06 DIAGNOSIS — M79671 Pain in right foot: Secondary | ICD-10-CM

## 2021-10-08 ENCOUNTER — Encounter: Payer: Self-pay | Admitting: Family Medicine

## 2021-10-08 NOTE — Assessment & Plan Note (Signed)
Requests rx for bedside commode. I will order and send message to [facility] RN team to coordinate. Patient also notes she was denied for a motorized wheelchair, is not sure why. I think she would very much benefit from this. She is really unable to propel herself in a manual wheelchair Will ask [facility] RN team to look into it.

## 2021-10-08 NOTE — Assessment & Plan Note (Signed)
>>  ASSESSMENT AND PLAN FOR INSULIN DEPENDENT TYPE 2 DIABETES MELLITUS ([place]) WRITTEN ON 10/08/2021  9:10 AM BY Deeandra Jerry, [name], MD  Uncontrolled with A1c of >15. Given elevated fasting sugars, increase tresiba to 105u twice daily. Patient has appointment at [facility] upcoming, plans to establish with endocrinology there

## 2021-10-08 NOTE — Assessment & Plan Note (Signed)
Would benefit from psychiatry evaluation. I believe in the past she may have had a diagnosis of bipolar disorder, unclear if this is an accurate diagnosis though. We were able to give her $4 so she could get transportation to [facility] this Friday to be seen for therapy and medication management. No SI/HI.

## 2021-10-08 NOTE — Progress Notes (Signed)
  Date of Visit: 09/29/2021   SUBJECTIVE:   HPI:  [name] presents today for routine follow up.  Diabetes - was scheduled to be seen at [facility] but has not seen them yet. Has appointment scheduled for weight loss on 11/29. Currently taking tresiba 100u twice daily, novolog 50u three times daily, trulicity 1.75mg  weekly. Fasting sugars are running 200s-300s routinely.  Pain management - taking oxycodone-acetaminophen 7.5-325mg  three times daily for pain. This is working well for her pain. Storing medication safely. Denies adverse side effects. Has appointment for pain management on 12/9 (previously had one on 10/5, which was canceled due to transportation issues).  Mood - went to [facility] recently for intake assessment, was deemed ok to follow up outpatient. She feels as though she needs to go back to get set up with a psychiatry provider and counselor, but has to be there early in the AM to get that done. Transportation is an issue, as is physically getting into the building. Prefers to go on a day her teenager Arna Medici can go with her. Arna Medici is off school this Friday but patient does not have $4 to pay for the [employer] bus to pick her up. Denies SI/HI.  Urine issues - wonders if she has a UTI. Urine has been cloudy for about a month. Denies burning with urination or fever. Only symptom is cloudy urine.  Skin lesion on stomach - has area beneath panus that she is concerned could be a boil. Of note she is currently taking bactrim for her foot, as prescribed by orthopedics.  OBJECTIVE:   BP 124/67   Pulse 85   Ht 5\' 2"  (1.575 m)   Wt (!) 349 lb (158.3 kg)   SpO2 97%   BMI 63.83 kg/m  Gen: no acute distress, pleasant cooperative HEENT: normocephalic, atraumatic  Lungs: normal work of breathing Neuro: alert, speech normal, grossly nonfocal Skin: area of slight erythema and skin breakdown beneath panus, no abscess palpable, no significant drainage  ASSESSMENT/PLAN:   Type 2 diabetes mellitus with  neurological complications ([name]) Uncontrolled with A1c of >15. Given elevated fasting sugars, increase tresiba to 105u twice daily. Patient has appointment at [facility] upcoming, plans to establish with endocrinology there  Encounter for chronic pain management Pain overall stable. Pain management appointment was delayed. Refill oxycodone-acetaminophen x2 months pdmp reviewed and is appropriate Benefits of therapy continue to outweigh risks.  Mood disorder ([place]) Would benefit from psychiatry evaluation. I believe in the past she may have had a diagnosis of bipolar disorder, unclear if this is an accurate diagnosis though. We were able to give her $4 so she could get transportation to [facility] this Friday to be seen for therapy and medication management. No SI/HI.  Impaired mobility Requests rx for bedside commode. I will order and send message to [facility] RN team to coordinate. Patient also notes she was denied for a motorized wheelchair, is not sure why. I think she would very much benefit from this. She is really unable to propel herself in a manual wheelchair Will ask [facility] RN team to look into it.  Urine cloudy Symptoms and UA not especially convincing for infection Await culture, if positive will treat  Skin concern under panus No signs of abscess or cellulitis Patient is well covered with current bactrim rx which she is on for her foot Continue to monitor  FOLLOW UP: Follow up in 1 mo for above issues  [name], [place]

## 2021-10-08 NOTE — Assessment & Plan Note (Signed)
Pain overall stable. Pain management appointment was delayed. Refill oxycodone-acetaminophen x2 months pdmp reviewed and is appropriate Benefits of therapy continue to outweigh risks.

## 2021-10-08 NOTE — Addendum Note (Signed)
Addended by: [name] on: 10/08/2021 05:16 PM   Modules accepted: Orders

## 2021-10-08 NOTE — Assessment & Plan Note (Signed)
Uncontrolled with A1c of >15. Given elevated fasting sugars, increase tresiba to 105u twice daily. Patient has appointment at [facility] upcoming, plans to establish with endocrinology there

## 2021-10-16 ENCOUNTER — Emergency Department (HOSPITAL_COMMUNITY): Payer: Medicaid Other

## 2021-10-16 ENCOUNTER — Emergency Department (HOSPITAL_COMMUNITY): Payer: Medicaid Other | Admitting: Anesthesiology

## 2021-10-16 ENCOUNTER — Encounter (HOSPITAL_COMMUNITY): Payer: Self-pay | Admitting: General Surgery

## 2021-10-16 ENCOUNTER — Other Ambulatory Visit: Payer: Self-pay

## 2021-10-16 ENCOUNTER — Encounter: Payer: Medicaid Other | Admitting: Physical Medicine and Rehabilitation

## 2021-10-16 ENCOUNTER — Encounter (HOSPITAL_COMMUNITY)
Admission: EM | Disposition: A | Discharge status: Home / Self Care | Payer: Self-pay | Source: Home / Self Care | Attending: Family Medicine

## 2021-10-16 ENCOUNTER — Inpatient Hospital Stay (HOSPITAL_COMMUNITY)
Admission: EM | Admit: 2021-10-16 | Discharge: 2021-10-25 | DRG: 579 | Disposition: A | Discharge status: Home / Self Care | Payer: Medicaid Other | Attending: Family Medicine | Admitting: Family Medicine

## 2021-10-16 DIAGNOSIS — Z83438 Family history of other disorder of lipoprotein metabolism and other lipidemia: Secondary | ICD-10-CM

## 2021-10-16 DIAGNOSIS — E1122 Type 2 diabetes mellitus with diabetic chronic kidney disease: Secondary | ICD-10-CM | POA: Diagnosis present

## 2021-10-16 DIAGNOSIS — Z794 Long term (current) use of insulin: Secondary | ICD-10-CM

## 2021-10-16 DIAGNOSIS — Z7982 Long term (current) use of aspirin: Secondary | ICD-10-CM

## 2021-10-16 DIAGNOSIS — Z7951 Long term (current) use of inhaled steroids: Secondary | ICD-10-CM

## 2021-10-16 DIAGNOSIS — Z6841 Body Mass Index (BMI) 40.0 and over, adult: Secondary | ICD-10-CM

## 2021-10-16 DIAGNOSIS — Z993 Dependence on wheelchair: Secondary | ICD-10-CM

## 2021-10-16 DIAGNOSIS — Z833 Family history of diabetes mellitus: Secondary | ICD-10-CM

## 2021-10-16 DIAGNOSIS — Z20822 Contact with and (suspected) exposure to covid-19: Secondary | ICD-10-CM | POA: Diagnosis present

## 2021-10-16 DIAGNOSIS — D509 Iron deficiency anemia, unspecified: Secondary | ICD-10-CM | POA: Diagnosis present

## 2021-10-16 DIAGNOSIS — Z885 Allergy status to narcotic agent status: Secondary | ICD-10-CM

## 2021-10-16 DIAGNOSIS — E662 Morbid (severe) obesity with alveolar hypoventilation: Secondary | ICD-10-CM | POA: Diagnosis present

## 2021-10-16 DIAGNOSIS — Z8249 Family history of ischemic heart disease and other diseases of the circulatory system: Secondary | ICD-10-CM

## 2021-10-16 DIAGNOSIS — G4733 Obstructive sleep apnea (adult) (pediatric): Secondary | ICD-10-CM | POA: Diagnosis not present

## 2021-10-16 DIAGNOSIS — Z89512 Acquired absence of left leg below knee: Secondary | ICD-10-CM

## 2021-10-16 DIAGNOSIS — Z89422 Acquired absence of other left toe(s): Secondary | ICD-10-CM

## 2021-10-16 DIAGNOSIS — E1165 Type 2 diabetes mellitus with hyperglycemia: Secondary | ICD-10-CM

## 2021-10-16 DIAGNOSIS — E1142 Type 2 diabetes mellitus with diabetic polyneuropathy: Secondary | ICD-10-CM | POA: Diagnosis present

## 2021-10-16 DIAGNOSIS — Z79899 Other long term (current) drug therapy: Secondary | ICD-10-CM

## 2021-10-16 DIAGNOSIS — E785 Hyperlipidemia, unspecified: Secondary | ICD-10-CM | POA: Diagnosis present

## 2021-10-16 DIAGNOSIS — K5900 Constipation, unspecified: Secondary | ICD-10-CM | POA: Diagnosis present

## 2021-10-16 DIAGNOSIS — Z888 Allergy status to other drugs, medicaments and biological substances status: Secondary | ICD-10-CM

## 2021-10-16 DIAGNOSIS — G8929 Other chronic pain: Secondary | ICD-10-CM

## 2021-10-16 DIAGNOSIS — T508X5A Adverse effect of diagnostic agents, initial encounter: Secondary | ICD-10-CM | POA: Diagnosis not present

## 2021-10-16 DIAGNOSIS — Z825 Family history of asthma and other chronic lower respiratory diseases: Secondary | ICD-10-CM

## 2021-10-16 DIAGNOSIS — N1831 Chronic kidney disease, stage 3a: Secondary | ICD-10-CM | POA: Diagnosis present

## 2021-10-16 DIAGNOSIS — G2581 Restless legs syndrome: Secondary | ICD-10-CM | POA: Diagnosis present

## 2021-10-16 DIAGNOSIS — G4700 Insomnia, unspecified: Secondary | ICD-10-CM | POA: Diagnosis present

## 2021-10-16 DIAGNOSIS — Z89612 Acquired absence of left leg above knee: Secondary | ICD-10-CM

## 2021-10-16 DIAGNOSIS — Z87891 Personal history of nicotine dependence: Secondary | ICD-10-CM

## 2021-10-16 DIAGNOSIS — L02214 Cutaneous abscess of groin: Principal | ICD-10-CM | POA: Diagnosis present

## 2021-10-16 DIAGNOSIS — J9611 Chronic respiratory failure with hypoxia: Secondary | ICD-10-CM | POA: Diagnosis present

## 2021-10-16 DIAGNOSIS — I5030 Unspecified diastolic (congestive) heart failure: Secondary | ICD-10-CM

## 2021-10-16 DIAGNOSIS — K219 Gastro-esophageal reflux disease without esophagitis: Secondary | ICD-10-CM | POA: Diagnosis present

## 2021-10-16 DIAGNOSIS — J9612 Chronic respiratory failure with hypercapnia: Secondary | ICD-10-CM | POA: Diagnosis present

## 2021-10-16 DIAGNOSIS — I129 Hypertensive chronic kidney disease with stage 1 through stage 4 chronic kidney disease, or unspecified chronic kidney disease: Secondary | ICD-10-CM | POA: Diagnosis present

## 2021-10-16 DIAGNOSIS — E559 Vitamin D deficiency, unspecified: Secondary | ICD-10-CM | POA: Diagnosis present

## 2021-10-16 DIAGNOSIS — J449 Chronic obstructive pulmonary disease, unspecified: Secondary | ICD-10-CM | POA: Diagnosis present

## 2021-10-16 DIAGNOSIS — L0291 Cutaneous abscess, unspecified: Secondary | ICD-10-CM | POA: Diagnosis present

## 2021-10-16 DIAGNOSIS — N170 Acute kidney failure with tubular necrosis: Secondary | ICD-10-CM | POA: Diagnosis not present

## 2021-10-16 DIAGNOSIS — R34 Anuria and oliguria: Secondary | ICD-10-CM | POA: Diagnosis not present

## 2021-10-16 DIAGNOSIS — Z77120 Contact with and (suspected) exposure to mold (toxic): Secondary | ICD-10-CM

## 2021-10-16 DIAGNOSIS — N179 Acute kidney failure, unspecified: Secondary | ICD-10-CM

## 2021-10-16 DIAGNOSIS — Z8616 Personal history of COVID-19: Secondary | ICD-10-CM

## 2021-10-16 DIAGNOSIS — H6091 Unspecified otitis externa, right ear: Secondary | ICD-10-CM | POA: Diagnosis present

## 2021-10-16 DIAGNOSIS — E1110 Type 2 diabetes mellitus with ketoacidosis without coma: Secondary | ICD-10-CM | POA: Diagnosis present

## 2021-10-16 DIAGNOSIS — Z9989 Dependence on other enabling machines and devices: Secondary | ICD-10-CM

## 2021-10-16 HISTORY — PX: INCISION AND DRAINAGE ABSCESS: SHX5864

## 2021-10-16 LAB — CBC
HCT: 34.4 % — ABNORMAL LOW (ref 36.0–46.0)
HCT: 32.9 % — ABNORMAL LOW (ref 36.0–46.0)
Hemoglobin: 11.3 g/dL — ABNORMAL LOW (ref 12.0–15.0)
Hemoglobin: 11 g/dL — ABNORMAL LOW (ref 12.0–15.0)
MCH: 31 pg (ref 26.0–34.0)
MCH: 30.8 pg (ref 26.0–34.0)
MCHC: 32.8 g/dL (ref 30.0–36.0)
MCHC: 33.4 g/dL (ref 30.0–36.0)
MCV: 94.5 fL (ref 80.0–100.0)
MCV: 92.2 fL (ref 80.0–100.0)
Platelets: 338 10*3/uL (ref 150–400)
Platelets: 315 10*3/uL (ref 150–400)
RBC: 3.64 MIL/uL — ABNORMAL LOW (ref 3.87–5.11)
RBC: 3.57 MIL/uL — ABNORMAL LOW (ref 3.87–5.11)
RDW: 13.9 % (ref 11.5–15.5)
RDW: 13.9 % (ref 11.5–15.5)
WBC: 16.7 10*3/uL — ABNORMAL HIGH (ref 4.0–10.5)
WBC: 17.8 10*3/uL — ABNORMAL HIGH (ref 4.0–10.5)
nRBC: 0 % (ref 0.0–0.2)
nRBC: 0 % (ref 0.0–0.2)

## 2021-10-16 LAB — I-STAT VENOUS BLOOD GAS, ED
Acid-Base Excess: 3 mmol/L — ABNORMAL HIGH (ref 0.0–2.0)
Bicarbonate: 28 mmol/L (ref 20.0–28.0)
Calcium, Ion: 1 mmol/L — ABNORMAL LOW (ref 1.15–1.40)
HCT: 36 % (ref 36.0–46.0)
Hemoglobin: 12.2 g/dL (ref 12.0–15.0)
O2 Saturation: 76 %
Potassium: 3.5 mmol/L (ref 3.5–5.1)
Sodium: 127 mmol/L — ABNORMAL LOW (ref 135–145)
TCO2: 29 mmol/L (ref 22–32)
pCO2, Ven: 45.2 mmHg (ref 44.0–60.0)
pH, Ven: 7.4 (ref 7.250–7.430)
pO2, Ven: 41 mmHg (ref 32.0–45.0)

## 2021-10-16 LAB — GLUCOSE, CAPILLARY
Glucose-Capillary: 289 mg/dL — ABNORMAL HIGH (ref 70–99)
Glucose-Capillary: 284 mg/dL — ABNORMAL HIGH (ref 70–99)

## 2021-10-16 LAB — RESP PANEL BY RT-PCR (FLU A&B, COVID) ARPGX2
Influenza A by PCR: NEGATIVE
Influenza B by PCR: NEGATIVE
SARS Coronavirus 2 by RT PCR: NEGATIVE

## 2021-10-16 LAB — CREATININE, SERUM
Creatinine, Ser: 1.95 mg/dL — ABNORMAL HIGH (ref 0.44–1.00)
GFR, Estimated: 32 mL/min — ABNORMAL LOW (ref >60)

## 2021-10-16 LAB — BASIC METABOLIC PANEL
Anion gap: 12 (ref 5–15)
BUN: 23 mg/dL — ABNORMAL HIGH (ref 6–20)
CO2: 25 mmol/L (ref 22–32)
Calcium: 7.9 mg/dL — ABNORMAL LOW (ref 8.9–10.3)
Chloride: 88 mmol/L — ABNORMAL LOW (ref 98–111)
Creatinine, Ser: 1.52 mg/dL — ABNORMAL HIGH (ref 0.44–1.00)
GFR, Estimated: 43 mL/min — ABNORMAL LOW (ref >60)
Glucose, Bld: 474 mg/dL — ABNORMAL HIGH (ref 70–99)
Potassium: 3.4 mmol/L — ABNORMAL LOW (ref 3.5–5.1)
Sodium: 125 mmol/L — ABNORMAL LOW (ref 135–145)

## 2021-10-16 LAB — TROPONIN I (HIGH SENSITIVITY)
Troponin I (High Sensitivity): 22 ng/L — ABNORMAL HIGH (ref <18)
Troponin I (High Sensitivity): 50 ng/L — ABNORMAL HIGH (ref <18)
Troponin I (High Sensitivity): 29 ng/L — ABNORMAL HIGH (ref <18)
Troponin I (High Sensitivity): 31 ng/L — ABNORMAL HIGH (ref <18)

## 2021-10-16 LAB — HIV ANTIBODY (ROUTINE TESTING W REFLEX): HIV Screen 4th Generation wRfx: NONREACTIVE

## 2021-10-16 LAB — CBG MONITORING, ED: Glucose-Capillary: 317 mg/dL — ABNORMAL HIGH (ref 70–99)

## 2021-10-16 SURGERY — INCISION AND DRAINAGE, ABSCESS
Anesthesia: General | Site: Groin | Laterality: Left

## 2021-10-16 MED ORDER — FENTANYL CITRATE (PF) 100 MCG/2ML IJ SOLN
INTRAMUSCULAR | Status: AC
  Filled 2021-10-16: qty 2

## 2021-10-16 MED ORDER — INSULIN ASPART 100 UNIT/ML IJ SOLN: 0.0000 [IU] | Freq: Three times a day (TID) | INTRAMUSCULAR | Status: DC

## 2021-10-16 MED ORDER — MORPHINE SULFATE (PF) 4 MG/ML IV SOLN: 4.0000 mg | Freq: Once | INTRAVENOUS | Status: DC

## 2021-10-16 MED ORDER — METRONIDAZOLE 500 MG/100ML IV SOLN
500.0000 mg | Freq: Two times a day (BID) | INTRAVENOUS | Status: DC
  Administered 2021-10-17 (×2): 500 mg via INTRAVENOUS
  Filled 2021-10-16 (×2): qty 100

## 2021-10-16 MED ORDER — OXYCODONE HCL 5 MG PO TABS
5.0000 mg | ORAL_TABLET | ORAL | Status: DC | PRN
Start: 2021-10-16 — End: 2021-10-17
  Administered 2021-10-16 – 2021-10-17 (×2): 10 mg via ORAL
  Filled 2021-10-16 (×2): qty 2

## 2021-10-16 MED ORDER — LIDOCAINE 2% (20 MG/ML) 5 ML SYRINGE
INTRAMUSCULAR | Status: DC | PRN
  Administered 2021-10-16: 60 mg via INTRAVENOUS

## 2021-10-16 MED ORDER — SODIUM CHLORIDE 0.9 % IV BOLUS
1000.0000 mL | Freq: Once | INTRAVENOUS | Status: AC
  Administered 2021-10-16: 1000 mL via INTRAVENOUS

## 2021-10-16 MED ORDER — LACTATED RINGERS IV SOLN
INTRAVENOUS | Status: DC | PRN
Start: 2021-10-16 — End: 2021-10-16

## 2021-10-16 MED ORDER — ONDANSETRON HCL 4 MG/2ML IJ SOLN
4.0000 mg | Freq: Once | INTRAMUSCULAR | Status: AC
  Administered 2021-10-16: 4 mg via INTRAVENOUS
  Filled 2021-10-16: qty 2

## 2021-10-16 MED ORDER — OXYCODONE HCL 5 MG PO TABS: 5.0000 mg | ORAL_TABLET | Freq: Once | ORAL | Status: DC | PRN

## 2021-10-16 MED ORDER — SODIUM CHLORIDE 0.9 % IV BOLUS: 1000.0000 mL | Freq: Once | INTRAVENOUS | Status: DC

## 2021-10-16 MED ORDER — ACETAMINOPHEN 325 MG PO TABS: 650.0000 mg | ORAL_TABLET | Freq: Four times a day (QID) | ORAL | Status: DC | PRN

## 2021-10-16 MED ORDER — ACETAMINOPHEN 160 MG/5ML PO SOLN: 1000.0000 mg | Freq: Once | ORAL | Status: DC | PRN

## 2021-10-16 MED ORDER — FENTANYL CITRATE (PF) 250 MCG/5ML IJ SOLN
INTRAMUSCULAR | Status: AC
  Filled 2021-10-16: qty 5

## 2021-10-16 MED ORDER — CHLORHEXIDINE GLUCONATE 0.12 % MT SOLN
15.0000 mL | Freq: Once | OROMUCOSAL | Status: DC
  Filled 2021-10-16: qty 15

## 2021-10-16 MED ORDER — VANCOMYCIN HCL 10 G IV SOLR
2500.0000 mg | Freq: Once | INTRAVENOUS | Status: AC
  Administered 2021-10-16: 2500 mg via INTRAVENOUS
  Filled 2021-10-16: qty 2500

## 2021-10-16 MED ORDER — MORPHINE SULFATE (PF) 4 MG/ML IV SOLN
4.0000 mg | Freq: Once | INTRAVENOUS | Status: AC
  Administered 2021-10-16: 4 mg via INTRAVENOUS
  Filled 2021-10-16: qty 1

## 2021-10-16 MED ORDER — ATORVASTATIN CALCIUM 80 MG PO TABS
80.0000 mg | ORAL_TABLET | Freq: Every day | ORAL | Status: DC
  Administered 2021-10-16 – 2021-10-24 (×9): 80 mg via ORAL
  Filled 2021-10-16 (×9): qty 1

## 2021-10-16 MED ORDER — ONDANSETRON HCL 4 MG PO TABS
4.0000 mg | ORAL_TABLET | Freq: Three times a day (TID) | ORAL | Status: DC | PRN
  Administered 2021-10-17: 4 mg via ORAL
  Filled 2021-10-16 (×2): qty 1

## 2021-10-16 MED ORDER — ACETAMINOPHEN 10 MG/ML IV SOLN: 1000.0000 mg | Freq: Once | INTRAVENOUS | Status: DC | PRN

## 2021-10-16 MED ORDER — ONDANSETRON HCL 4 MG/2ML IJ SOLN
INTRAMUSCULAR | Status: DC | PRN
  Administered 2021-10-16: 4 mg via INTRAVENOUS

## 2021-10-16 MED ORDER — ACETAMINOPHEN 500 MG PO TABS: 1000.0000 mg | ORAL_TABLET | Freq: Once | ORAL | Status: DC | PRN

## 2021-10-16 MED ORDER — SODIUM CHLORIDE 0.9 % IV SOLN
2.0000 g | INTRAVENOUS | Status: DC
  Administered 2021-10-16 – 2021-10-20 (×5): 2 g via INTRAVENOUS
  Filled 2021-10-16 (×6): qty 20

## 2021-10-16 MED ORDER — ORAL CARE MOUTH RINSE: 15.0000 mL | Freq: Once | OROMUCOSAL | Status: DC

## 2021-10-16 MED ORDER — IOHEXOL 350 MG/ML SOLN
100.0000 mL | Freq: Once | INTRAVENOUS | Status: AC | PRN
  Administered 2021-10-16: 100 mL via INTRAVENOUS

## 2021-10-16 MED ORDER — SODIUM CHLORIDE 0.9 % IV SOLN: INTRAVENOUS | Status: DC

## 2021-10-16 MED ORDER — PROPOFOL 10 MG/ML IV BOLUS
INTRAVENOUS | Status: DC | PRN
  Administered 2021-10-16: 30 mg via INTRAVENOUS
  Administered 2021-10-16: 100 mg via INTRAVENOUS

## 2021-10-16 MED ORDER — GABAPENTIN 600 MG PO TABS
1200.0000 mg | ORAL_TABLET | Freq: Three times a day (TID) | ORAL | Status: DC
  Administered 2021-10-17 (×2): 1200 mg via ORAL
  Filled 2021-10-16 (×2): qty 2

## 2021-10-16 MED ORDER — TIOTROPIUM BROMIDE MONOHYDRATE 1.25 MCG/ACT IN AERS: 2.0000 | INHALATION_SPRAY | Freq: Every day | RESPIRATORY_TRACT | Status: DC

## 2021-10-16 MED ORDER — ROCURONIUM BROMIDE 10 MG/ML (PF) SYRINGE
PREFILLED_SYRINGE | INTRAVENOUS | Status: DC | PRN
  Administered 2021-10-16: 50 mg via INTRAVENOUS

## 2021-10-16 MED ORDER — FENTANYL CITRATE (PF) 100 MCG/2ML IJ SOLN
25.0000 ug | INTRAMUSCULAR | Status: DC | PRN
  Administered 2021-10-16: 50 ug via INTRAVENOUS

## 2021-10-16 MED ORDER — FERROUS SULFATE 325 (65 FE) MG PO TABS
325.0000 mg | ORAL_TABLET | ORAL | Status: DC
  Administered 2021-10-17 – 2021-10-25 (×4): 325 mg via ORAL
  Filled 2021-10-16 (×4): qty 1

## 2021-10-16 MED ORDER — SUCCINYLCHOLINE CHLORIDE 200 MG/10ML IV SOSY
PREFILLED_SYRINGE | INTRAVENOUS | Status: DC | PRN
  Administered 2021-10-16: 140 mg via INTRAVENOUS

## 2021-10-16 MED ORDER — SUGAMMADEX SODIUM 200 MG/2ML IV SOLN
INTRAVENOUS | Status: DC | PRN
  Administered 2021-10-16: 500 mg via INTRAVENOUS

## 2021-10-16 MED ORDER — ACETAMINOPHEN 650 MG RE SUPP: 650.0000 mg | Freq: Four times a day (QID) | RECTAL | Status: DC | PRN

## 2021-10-16 MED ORDER — MOMETASONE FURO-FORMOTEROL FUM 200-5 MCG/ACT IN AERO
2.0000 | INHALATION_SPRAY | Freq: Two times a day (BID) | RESPIRATORY_TRACT | Status: DC
  Filled 2021-10-16: qty 8.8

## 2021-10-16 MED ORDER — IRON 325 (65 FE) MG PO TABS: 325.0000 mg | ORAL_TABLET | ORAL | Status: DC

## 2021-10-16 MED ORDER — POLYETHYLENE GLYCOL 3350 17 G PO PACK: 17.0000 g | PACK | Freq: Every day | ORAL | Status: DC | PRN

## 2021-10-16 MED ORDER — 0.9 % SODIUM CHLORIDE (POUR BTL) OPTIME
TOPICAL | Status: DC | PRN
  Administered 2021-10-16: 1000 mL

## 2021-10-16 MED ORDER — PANTOPRAZOLE SODIUM 40 MG PO TBEC
40.0000 mg | DELAYED_RELEASE_TABLET | Freq: Every day | ORAL | Status: DC
  Administered 2021-10-16 – 2021-10-24 (×9): 40 mg via ORAL
  Filled 2021-10-16 (×9): qty 1

## 2021-10-16 MED ORDER — FENTANYL CITRATE (PF) 100 MCG/2ML IJ SOLN
INTRAMUSCULAR | Status: DC | PRN
  Administered 2021-10-16: 50 ug via INTRAVENOUS

## 2021-10-16 MED ORDER — LACTATED RINGERS IV SOLN: INTRAVENOUS | Status: DC

## 2021-10-16 MED ORDER — UMECLIDINIUM BROMIDE 62.5 MCG/ACT IN AEPB
1.0000 | INHALATION_SPRAY | Freq: Every day | RESPIRATORY_TRACT | Status: DC
  Filled 2021-10-16: qty 7

## 2021-10-16 MED ORDER — ENOXAPARIN SODIUM 40 MG/0.4ML IJ SOSY
40.0000 mg | PREFILLED_SYRINGE | INTRAMUSCULAR | Status: DC
  Administered 2021-10-17: 40 mg via SUBCUTANEOUS
  Filled 2021-10-16: qty 0.4

## 2021-10-16 MED ORDER — NYSTATIN 100000 UNIT/GM EX POWD
Freq: Two times a day (BID) | CUTANEOUS | Status: DC
  Administered 2021-10-23: 1 via TOPICAL
  Filled 2021-10-16: qty 15

## 2021-10-16 MED ORDER — CHLORHEXIDINE GLUCONATE 0.12 % MT SOLN
OROMUCOSAL | Status: AC
  Filled 2021-10-16: qty 15

## 2021-10-16 MED ORDER — MIDAZOLAM HCL 2 MG/2ML IJ SOLN
INTRAMUSCULAR | Status: AC
  Filled 2021-10-16: qty 2

## 2021-10-16 MED ORDER — OXYCODONE HCL 5 MG/5ML PO SOLN: 5.0000 mg | Freq: Once | ORAL | Status: DC | PRN

## 2021-10-16 MED ORDER — PIPERACILLIN-TAZOBACTAM 3.375 G IVPB 30 MIN
3.3750 g | Freq: Once | INTRAVENOUS | Status: AC
  Administered 2021-10-16: 3.375 g via INTRAVENOUS
  Filled 2021-10-16: qty 50

## 2021-10-16 SURGICAL SUPPLY — 30 items
BAG COUNTER SPONGE SURGICOUNT (BAG) ×2 IMPLANT
BAG SPNG CNTER NS LX DISP (BAG) ×1
BNDG GAUZE ELAST 4 BULKY (GAUZE/BANDAGES/DRESSINGS) ×1 IMPLANT
CANISTER SUCT 3000ML PPV (MISCELLANEOUS) ×2 IMPLANT
COVER SURGICAL LIGHT HANDLE (MISCELLANEOUS) ×2 IMPLANT
DRAIN PENROSE 0.75X12 (DRAIN) ×1 IMPLANT
DRAPE LAPAROSCOPIC ABDOMINAL (DRAPES) ×2 IMPLANT
DRSG PAD ABDOMINAL 8X10 ST (GAUZE/BANDAGES/DRESSINGS) ×2 IMPLANT
ELECT CAUTERY BLADE 6.4 (BLADE) ×2 IMPLANT
ELECT REM PT RETURN 9FT ADLT (ELECTROSURGICAL) ×2
ELECTRODE REM PT RTRN 9FT ADLT (ELECTROSURGICAL) ×1 IMPLANT
GAUZE SPONGE 4X4 12PLY STRL (GAUZE/BANDAGES/DRESSINGS) ×3 IMPLANT
GLOVE SRG 8 PF TXTR STRL LF DI (GLOVE) ×1 IMPLANT
GLOVE SURG ENC MOIS LTX SZ8 (GLOVE) ×2 IMPLANT
GLOVE SURG UNDER POLY LF SZ8 (GLOVE) ×2
GOWN STRL REUS W/ TWL LRG LVL3 (GOWN DISPOSABLE) ×1 IMPLANT
GOWN STRL REUS W/ TWL XL LVL3 (GOWN DISPOSABLE) ×1 IMPLANT
GOWN STRL REUS W/TWL LRG LVL3 (GOWN DISPOSABLE) ×2
GOWN STRL REUS W/TWL XL LVL3 (GOWN DISPOSABLE) ×2
KIT BASIN OR (CUSTOM PROCEDURE TRAY) ×2 IMPLANT
KIT TURNOVER KIT B (KITS) ×2 IMPLANT
NS IRRIG 1000ML POUR BTL (IV SOLUTION) ×2 IMPLANT
PACK GENERAL/GYN (CUSTOM PROCEDURE TRAY) ×2 IMPLANT
PAD ARMBOARD 7.5X6 YLW CONV (MISCELLANEOUS) ×2 IMPLANT
PENCIL SMOKE EVACUATOR (MISCELLANEOUS) ×2 IMPLANT
SUT ETHILON 2 0 FS 18 (SUTURE) ×1 IMPLANT
SWAB COLLECTION DEVICE MRSA (MISCELLANEOUS) ×1 IMPLANT
SWAB CULTURE ESWAB REG 1ML (MISCELLANEOUS) ×1 IMPLANT
TOWEL GREEN STERILE (TOWEL DISPOSABLE) ×2 IMPLANT
TOWEL GREEN STERILE FF (TOWEL DISPOSABLE) ×2 IMPLANT

## 2021-10-16 NOTE — Progress Notes (Addendum)
Received page from pharmacy regarding no antibiotics ordered for post op restart. Upon chart check, appears patient received vanc and zosyn today pre-operatively. Pharmacist concerned about continuing gram negative coverage post-op. Given AKI on admission, would like to avoid using vanc and zosyn together. Pharmacist recommended cefepime and flagyl. He will place these orders. Day team tomorrow will re-assess and change if appropriate.   Ezequiel Essex, MD  ___________________________ [facility] [place]  Intolerance to cefepime in chart, listed as "AKI". Will do ceftriaxone instead.  Ezequiel Essex, MD PGY-2, [place] [facility] pager [phone]

## 2021-10-16 NOTE — Progress Notes (Signed)
I examined the patient in the preoperative area. Patient has an abscess in the left groin, proceed to the OR for incision and drainage. Informed consent obtained. IV antibiotics started.  Michaelle Birks, [facility], Hepatobiliary and Pancreatic Surgery 10/16/21 5:04 PM

## 2021-10-16 NOTE — Anesthesia Postprocedure Evaluation (Signed)
Anesthesia Post Note  Patient: [name]  Procedure(s) Performed: INCISION AND DRAINAGE ABSCESS (Left: Groin)     Patient location during evaluation: PACU Anesthesia Type: General Level of consciousness: awake and alert Pain management: pain level controlled Vital Signs Assessment: post-procedure vital signs reviewed and stable Respiratory status: spontaneous breathing, nonlabored ventilation and respiratory function stable Cardiovascular status: blood pressure returned to baseline and stable Postop Assessment: no apparent nausea or vomiting Anesthetic complications: no   No notable events documented.  Last Vitals:  Vitals:   10/16/21 1934 10/16/21 2005  BP: (!) 126/50   Pulse: 99 96  Resp: 20 19  Temp:  37.2 C  SpO2: 98% 94%    Last Pain:  Vitals:   10/16/21 2005  TempSrc: Oral  PainSc:                  Catalina Gravel

## 2021-10-16 NOTE — Anesthesia Preprocedure Evaluation (Addendum)
Anesthesia Evaluation  Patient identified by MRN, date of birth, ID band Patient awake    Reviewed: Allergy & Precautions, Patient's Chart, lab work & pertinent test results, reviewed documented beta blocker date and time   History of Anesthesia Complications Negative for: history of anesthetic complications  Airway Mallampati: IV  TM Distance: >3 FB Neck ROM: Full  Mouth opening: Limited Mouth Opening  Dental  (+) Dental Advisory Given   Pulmonary asthma , sleep apnea and Continuous Positive Airway Pressure Ventilation , pneumonia, COPD,  COPD inhaler, former smoker,    breath sounds clear to auscultation       Cardiovascular hypertension, Pt. on medications + Peripheral Vascular Disease   Rhythm:Regular  '18 TTE -  Ejection fraction was in the range of 55%   to 60%. Left atrium was mildly dilated.   Neuro/Psych  Headaches, PSYCHIATRIC DISORDERS Depression Bipolar Disorder Diabetic neuropathy  Neuromuscular disease    GI/Hepatic negative GI ROS, Neg liver ROS,   Endo/Other  diabetes, Poorly Controlled, Type 2, Insulin DependentMorbid obesityLab Results      Component                Value               Date                      HGBA1C                   >15.0               09/29/2021             Renal/GU   negative genitourinary   Musculoskeletal  (+) Arthritis , Osteoarthritis,  Osteomyelitis left foot   Abdominal   Peds  Hematology  (+) Blood dyscrasia, anemia ,   Anesthesia Other Findings Day of surgery medications reviewed with the patient.  Reproductive/Obstetrics                                                             Anesthesia Evaluation  Patient identified by MRN, date of birth, ID band Patient awake    Reviewed: Allergy & Precautions, H&P , NPO status , Patient's Chart, lab work & pertinent test results, reviewed documented beta blocker date and time   History of Anesthesia  Complications Negative for: history of anesthetic complications  Airway Mallampati: III  Neck ROM: Limited    Dental  (+) Teeth Intact and Dental Advisory Given   Pulmonary asthma , sleep apnea and Continuous Positive Airway Pressure Ventilation , COPD COPD inhaler,    + decreased breath sounds      Cardiovascular hypertension, Pt. on medications and Pt. on home beta blockers + angina Rhythm:Regular Rate:Normal  Cardiac cath 2013 - "clear per patient"   Neuro/Psych  Neuromuscular disease    GI/Hepatic Neg liver ROS, GERD-  Medicated and Controlled,  Endo/Other  diabetes, Type 2, Oral Hypoglycemic Agents and Insulin DependentMorbid obesity  Renal/GU negative Renal ROS     Musculoskeletal negative musculoskeletal ROS (+)   Abdominal (+) + obese,   Peds  Hematology negative hematology ROS (+)   Anesthesia Other Findings   Reproductive/Obstetrics negative OB ROS  Anesthesia Physical Anesthesia Plan  ASA: IV  Anesthesia Plan: General   Post-op Pain Management:    Induction: Intravenous  Airway Management Planned: Oral ETT and Video Laryngoscope Planned  Additional Equipment:   Intra-op Plan:   Post-operative Plan: Extubation in OR and Possible Post-op intubation/ventilation  Informed Consent: I have reviewed the patients History and Physical, chart, labs and discussed the procedure including the risks, benefits and alternatives for the proposed anesthesia with the patient or authorized representative who has indicated his/her understanding and acceptance.   Dental advisory given  Plan Discussed with: CRNA and Surgeon  Anesthesia Plan Comments:         Anesthesia Quick Evaluation  Anesthesia Physical  Anesthesia Plan  ASA: 4 and emergent  Anesthesia Plan: General   Post-op Pain Management:    Induction: Intravenous and Rapid sequence  PONV Risk Score and Plan: 4 or greater and  Ondansetron, Treatment may vary due to age or medical condition, Dexamethasone and Midazolam  Airway Management Planned: Oral ETT and Video Laryngoscope Planned  Additional Equipment: None  Intra-op Plan:   Post-operative Plan: Extubation in OR  Informed Consent: I have reviewed the patients History and Physical, chart, labs and discussed the procedure including the risks, benefits and alternatives for the proposed anesthesia with the patient or authorized representative who has indicated his/her understanding and acceptance.     Dental advisory given  Plan Discussed with: Anesthesiologist and CRNA  Anesthesia Plan Comments:        Anesthesia Quick Evaluation

## 2021-10-16 NOTE — Op Note (Signed)
Date: 10/16/21  Patient: [name] MRN: [id number]  Preoperative Diagnosis: Left inguinal abscess Postoperative Diagnosis: Same  Procedure: Incision and drainage of left inguinal abscess  Surgeon: Michaelle Birks, MD  EBL: Minimal  Anesthesia: General endotracheal  Specimens: Left inguinal abscess for culture  Indications: Ms. Twist is a 45 yo female who presented to the ED with malaise and worsening pain in the left groin, as well as hyperglycemia. She was found to have a leukocytosis with an abscess in the left groin. She was brought to the operating room for incision and drainage.  Findings: Subcutaneous abscess in the left groin.  Procedure details: Informed consent was obtained in the preoperative area prior to the procedure. The patient was brought to the operating room and placed on the table in the supine position. General anesthesia was induced and appropriate lines and drains were placed for intraoperative monitoring. Perioperative antibiotics were administered per SCIP guidelines. The patient was prepped and draped in the usual sterile fashion. A pre-procedure timeout was taken verifying patient identity, surgical site and procedure to be performed.  There was significant fluctuance and induration in the left inguinal crease with some spontaneous drainage of purulent fluid. An incision was made over the fluctuant area using an 11-blade scalpel, and there was immediate drainage of a large amount of purulent fluid. The abscess cavity was probed and tracked superiorly. Wound cultures were sent and the abscess cavity was copiously irrigated with saline. Hemostasis was achieved with cautery. A counter-incision was made superiorly and a 1-inch penrose was placed into the wound through the counter-incision and secured to itself with a 2-0 Nylon suture. The wound was then packed with saline-moistened kerlex and a sterile gauze dressing was applied.  The patient tolerated the procedure  with no apparent complications. All counts were correct x2 at the end of the procedure. The patient was extubated and taken to PACU in stable condition.  Michaelle Birks, MD 10/16/21 6:03 PM

## 2021-10-16 NOTE — Anesthesia Procedure Notes (Signed)
Procedure Name: Intubation Date/Time: 10/16/2021 5:38 PM Performed by: Georgia Duff, CRNA Pre-anesthesia Checklist: Patient identified, Emergency Drugs available, Suction available and Patient being monitored Patient Re-evaluated:Patient Re-evaluated prior to induction Oxygen Delivery Method: Circle System Utilized Preoxygenation: Pre-oxygenation with 100% oxygen Induction Type: IV induction Ventilation: Mask ventilation without difficulty Laryngoscope Size: Glidescope and 3 Grade View: Grade I Tube type: Oral Tube size: 7.0 mm Number of attempts: 1 Airway Equipment and Method: Stylet and Oral airway Placement Confirmation: ETT inserted through vocal cords under direct vision, positive ETCO2 and breath sounds checked- equal and bilateral Secured at: 21 cm Tube secured with: Tape Dental Injury: Teeth and Oropharynx as per pre-operative assessment

## 2021-10-16 NOTE — Progress Notes (Signed)
Inpatient Diabetes Program Recommendations  AACE/ADA: New Consensus Statement on Inpatient Glycemic Control (2015)  Target Ranges:  Prepandial:   less than 140 mg/dL      Peak postprandial:   less than 180 mg/dL (1-2 hours)      Critically ill patients:  140 - 180 mg/dL   Lab Results  Component Value Date   GLUCAP 317 (H) 10/16/2021   HGBA1C >15.0 09/29/2021    Review of Glycemic Control Results for [name], [name] (MRN [id number]) as of 10/16/2021 16:18  Ref. Range 10/16/2021 14:55  Glucose-Capillary Latest Ref Range: 70 - 99 mg/dL 317 (H)   Diabetes history: Type 2 DM Outpatient Diabetes medications: Tresiba 100 units BID, Novolog 50 units TID, Trulicity 5.17 mg qwk Current orders for Inpatient glycemic control: none  Inpatient Diabetes Program Recommendations:    With NPO status and glucose trending down, consider Novolog 0-20 units Q4H  Spoke with patient regarding outpatient diabetes management. Verified home doses, denies missing doses and reports taking Tresiba 100 units this AM. Patient tearful during conversation as she is frustrated by diabetes. Has been on U-500 in the past, which caused vomiting and has a referral for outpatient endocrinology. Admits to polydipsia and polyuria. "I just don't know what to do." Started on the regimen above a couple of months ago. Reviewed patient's current A1c of >15%. Explained what a A1c is and what it measures. Also reviewed goal A1c with patient, importance of good glucose control @ home, and blood sugar goals. Reviewed patho of DM, need for improved glycemic control, interventions, hypoglycemia awareness in the 200's mg/dL, U-500 vs Tresiba/Novolog, importance of rotating injection sites, impact of poor glycemic control and risk for infection, vascular changes and commorbidities.  Patient was wearing a Dexcom up until two days ago. Had additional supply but was feeling poorly, thus didn't replace. Reviewed recommended target goals and to  discuss working on elevated goals at first with outpatient endocrinology and ease down slowly. Reviewed when to call MD. Stressed the importance of good follow up.  Palpated patients right lower abdomen and felt 5 cm x 5 cm area of scar tissue. Discussed importance of rotating sites.  During conversation of U-500, patient was on 100 units QD previously. Patient states, "I was allergic to it because it made me throw up every time I took it." During conversation, discovered patient was having lows into the 40's-50's mg/dL with this dosage and previous endocrinologist discontinued medication. Feel this medication could be better utilized in BID dosing with reduce dose. Patient is open and willing. Encouraged conversation with new endocrinologist.  Will follow.    Thanks, Bronson Curb, MSN, RNC-OB Diabetes Coordinator [phone] (8a-5p)

## 2021-10-16 NOTE — ED Provider Notes (Signed)
Emergency Medicine Provider Triage Evaluation Note  [name] , a 45 y.o. female  was evaluated in triage.  Pt complains of nausea, diarrhea, chest pain that began this morning and hyperglycemia.  She has been feeling sick over the last few days however this morning she began to have chest pains.  Blood glucose noted to be in the 500s.  She reports that this has been happening on her last few readings.  Took insulin this morning prior to calling EMS.  Review of Systems  Positive: As above Negative: Shortness of breath  Physical Exam  BP 101/65 (BP Location: Right Arm)   Pulse 88   Temp 98.8 F (37.1 C) (Oral)   Resp (!) 22   SpO2 96%  Gen:   Awake, no distress   Resp:  Normal effort  MSK:   Moves extremities without difficulty  Other:  RRR, some expiratory wheezes  Medical Decision Making  Medically screening exam initiated at 9:25 AM.  Appropriate orders placed.  [name] was informed that the remainder of the evaluation will be completed by another provider, this initial triage assessment does not replace that evaluation, and the importance of remaining in the ED until their evaluation is complete.     Darliss Ridgel 10/16/21 [place], MD 10/16/21 1005

## 2021-10-16 NOTE — H&P (Addendum)
[facility] Admission History and Physical Service Pager: [phone]  Patient name: [name] Medical record number: [id number] Date of birth: [birth year] Age: 45 y.o. Gender: female  Primary Care Provider: [name], MD Code Status: Full  Emergency Contact: Vita Barley (sister): [phone] Nelia Rogoff (mom): [phone]  Chief Complaint: Left groin abscess  Assessment and Plan: [name] is a 45 y.o. year old female presenting with left groin abscess, hyperglycemia, and chest pain.  Past medical history significant for type 2 diabetes, morbid obesity, obesity hypoventilation syndrome, OSA wears BiPAP at night, hypertension, asthma, GERD, hyperlipidemia, restless leg syndrome, left BKA, mood disorder, hypertension, history of chest pain with low risk for cardiac etiology.  Left groin abscess Patient has history of multiple recurrent groin abscesses.  Has had to have these drained in the past.  Says this one started last week which she put warm compresses on it and did not lance herself.  She says it progressively got worse.  Denies any fevers.  Had called EMS yesterday as she was feeling bad, however ended up staying at home given wait time of 10 hours in the ED.  Decided to come in today due to feeling worse and having chest pain.  In the ED white blood cell count was elevated at 16.7, and she was afebrile.  Overall vital signs were within normal limits and saturating well on room air.  Imaging of CT pelvis with contrast showed left groin soft tissue collection with gas measuring at least 4.0 x 11.0 x 12.4 cm consistent with abscess. General surgery was consulted for incision and drainage.  IV antibiotics of vancomycin and Zosyn were given for a one-time dose while in the ED. On physical exam tender to light palpation and erythematous left groin abscess that was warm.  Likely there is a component of cellulitis as well given erythema surrounding skin. L BKA and  R ankle amputation present on exam, wheelchair bound. -Admit to FPTS attending Dr. Erin Hearing, med telemetry -General surgery consulted, appreciate assistance I&D today -Reassess antibiotic regimen tomorrow -Acetaminophen 650 mg every 6 hours as needed -Oxycodone 5 mg every 4 hours as needed for severe pain -zofran for nausea -vital signs per floor protocol -oxygenation prn -routine dressing changes -F/u surgical cultures -CBC, BMP tomorrow a.m.  Hyperglycemia  DM2, Poorly Controlled Glucose was 474 on admission, recent CBGs have come down to 289.  Anion gap within normal limits, blood gas with normal pH.  Patient has not eaten since yesterday morning due to nausea. A1c>15 on admission. Home regimen of 100 units degludec BID and novolog 50 units TID with weekly Trulicity injections on Sundays.  Took 50 units NovoLog this morning. -If eating after surgery can consider giving 20 units semglee daily -Likely will need escalation of her diabetic regimen, however has not eaten since yesterday morning  -Consult to diabetes coordinator given A1c greater than 15 -CBG monitoring  Chest pain Patient says she started having chest pain this morning was on her left side and felt like a constant pressure that has Hall ongoing during her hospitalization. Troponins trended 22> 50> 29.  EKG with normal sinus rhythm and no acute ST changes.  Heart rates have Hall in the 80s.  Blood pressures normotensive and saturating well on room air.  Likely not cardiac etiology given downtrending troponins and normal EKG.  Could be secondary to anxious mood. -cardiac monitoring -Repeat EKG if chest pain worsening  Diarrhea Patient says since Sunday she has Hall having episodes of  nonbloody diarrhea about twice a day.  Says she has had no sick contacts or symptoms.  Could be related to her uncontrolled diabetes.  Sodium and potassium decreased to 125 and 3.4 respectively. -Monitor -NS IVF 150 mL/hr  AKI, likely  pre-renal Creatine of 1.52 on admission. Last Cr around 1.17 in May. Baseline low to mid 1s. Has not Hall able to drink well due to nausea. -NS bolus -NS IVF 150 mL/hr -Monitor on BMP  Hyponatremia, Hypokalemia, Hypochloremia Sodium of 125 on admission with repeat of 127. K of 3.4 with repeat of 3.5. Chloride of 88. Likely secondary to diarrhea and vomiting. -NS IVF -Monitor on BMP -Replete as necessary  HLD Home medication of atorvastatin 80 mg daily -Continue home medication  OSA Wears bipap 3L at night  -Provide while in hospital  Asthma/Obesity Hypoventilation Syndrome Home medications of albuterol inhaler as needed and spiriva. -Dulera 2 puffs twice daily -Tiotropium bromide 2 puffs daily  IDA Hgb 11.8 on admission -Iron tablets 325 mg daily -Monitor on CBC  GERD -Protonix 40 mg while in hospital  FEN/GI: Diet carb modified when out of surgery Prophylaxis:lovenox Dispo: Med telemetry  History of Present Illness: [name] is a 45 y.o. year old female presenting with left groin abscess, chest pain, and hyperglycemia.   Noticed an abscess about a week ago. Has had it prior. Denies fever. Does endorse chest pain in the middle of chest and toward left side that started this morning. Worse when she moves. Denies radiation of pain. Feels like constant pressure on her chest. Nothing makes it better.   Endorses non bloody diarrhea that started Sunday. States she has had it about once or twice a day Denies any URI symptoms. Denies sick contacts   EMS came to house yesterday and sugar was high when they checked it then. Was told wait time was about 10 hours so opted not to come in.   Takes tresiba, novolog, trulicity. Took 50 units novolog today. States she had had high blood sugars forever and cant seem to get it down   Denies headache, vision changes, abdominal pain. Denies any urinary symptoms    Review Of Systems: Per HPI. Otherwise 12 point review of systems was  performed and was unremarkable.  Patient Active Problem List   Diagnosis Date Noted   Impaired mobility 07/07/2021   Chest pain with low risk for cardiac etiology 05/11/2021   Morbid obesity with BMI of 60.0-69.9, adult ([place]) 04/15/2021   Hypoxia 04/15/2021   UTI (urinary tract infection) 04/15/2021   Chronic pain 04/15/2021   Pneumonia due to COVID-19 virus 04/14/2021   Urinary incontinence 10/23/2020   Elevated troponin    Acute respiratory failure with hypoxia and hypercarbia ([name]) 06/15/2020   Chronic respiratory failure with hypoxia ([place]) 06/14/2020   Blister of foot 04/21/2020   Exposure to mold 04/21/2020   Constipation 04/03/2020   Left shoulder pain 06/23/2019   Dysuria 06/14/2019   Hyperglycemia due to diabetes mellitus ([place])    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[place]) 04/11/2019   Allergic rhinitis 03/06/2018   Class 3 severe obesity due to excess calories with serious comorbidity and body mass index (BMI) of 50.0 to 59.9 in adult [facility])    Decreased pedal pulses 06/10/2017   Unilateral primary osteoarthritis, right knee 10/22/2016   Primary osteoarthritis of first carpometacarpal joint of left hand 07/30/2016   Diabetic neuropathy ([place]) 07/14/2016   Nausea 03/17/2016   De Quervain's tenosynovitis, bilateral 11/01/2015   Vitamin D deficiency 09/05/2015  Recurrent candidiasis of vagina 09/05/2015   Varicose veins of leg with complications 53/66/4403   Restless leg syndrome 10/17/2014   Chronic sinusitis 07/18/2014   Insomnia 08/14/2013   Encounter for chronic pain management 06/30/2013   HLD (hyperlipidemia) 11/19/2012   Angina pectoris ([place]) 06/27/2012   Abscess of skin and subcutaneous tissue 11/04/2011   Right carpal tunnel syndrome 09/01/2011   Bilateral knee pain 09/01/2011   Type 2 diabetes mellitus with neurological complications ([place]) 47/42/5956   Morbid obesity ([place]) 05/22/2008   Obesity hypoventilation syndrome ([place]) 05/22/2008   Mood disorder ([place]) 05/22/2008    Obstructive sleep apnea 05/22/2008   Hypertension 05/22/2008   Asthma 05/22/2008   GERD 05/22/2008   Past Medical History: Past Medical History:  Diagnosis Date   Acute osteomyelitis of ankle or foot, left ([place]) 01/31/2018   Alveolar hypoventilation    Anemia    not on iron pill   Asthma    Bipolar 2 disorder ([name])    Carpal tunnel syndrome on right    recurrent   Cellulitis 08/2010-08/2011   Chronic pain    COPD (chronic obstructive pulmonary disease) ([name])    Symbicort daily and Proventil as needed   Costochondritis    Diabetes mellitus type II, uncontrolled 2000   Type 2, Uncontrolled.Takes Lantus daily.Fasting blood sugar runs 150   Drug-seeking behavior    GERD (gastroesophageal reflux disease)    takes Pantoprazole and Zantac daily   HLD (hyperlipidemia)    takes Atorvastatin daily   Hypertension    takes Lisinopril and Coreg daily   Morbid obesity ([name])    Nocturia    OSA on CPAP    Peripheral neuropathy    takes Gabapentin daily   Pneumonia    "walking" several yrs ago and as a baby (12/05/2018)   Rectal fissure    Restless leg    SVT (supraventricular tachycardia) ([name])    Syncope 02/25/2016   Urinary frequency    Urinary incontinence 10/23/2020   Varicose veins    Right medial thigh and Left leg    Past Surgical History: Past Surgical History:  Procedure Laterality Date   AMPUTATION Left 02/01/2018   Procedure: LEFT FOURTH AND 5TH TOE RAY AMPUTATION;  Surgeon: [name], MD;  Location: [place];  Service: Orthopedics;  Laterality: Left;   AMPUTATION Left 03/03/2018   Procedure: LEFT BELOW KNEE AMPUTATION;  Surgeon: [name], MD;  Location: [place];  Service: Orthopedics;  Laterality: Left;   CARPAL TUNNEL RELEASE Bilateral    CESAREAN SECTION  2007   CORONARY ANGIOGRAPHY N/A 11/26/2020   Procedure: CORONARY ANGIOGRAPHY;  Surgeon: Sherren Mocha, MD;  Location: [place] [facility];  Service: Cardiovascular;  Laterality: N/A;   INCISION AND DRAINAGE  PERIRECTAL ABSCESS Left 05/18/2019   Procedure: IRRIGATION AND DEBRIDEMENT OF PANNIS ABSCESS, POSSIBLE DEBRIDEMENT OF BUTTOCK WOUND;  Surgeon: Donnie Mesa, MD;  Location: [place];  Service: General;  Laterality: Left;   IRRIGATION AND DEBRIDEMENT BUTTOCKS Left 05/17/2019   Procedure: IRRIGATION AND DEBRIDEMENT BUTTOCKS;  Surgeon: Donnie Mesa, MD;  Location: [place];  Service: General;  Laterality: Left;   KNEE ARTHROSCOPY Right 07/17/2010   LEFT HEART CATHETERIZATION WITH CORONARY ANGIOGRAM N/A 07/27/2012   Procedure: LEFT HEART CATHETERIZATION WITH CORONARY ANGIOGRAM;  Surgeon: Sherren Mocha, MD;  [facility] C[facility];  Service: Cardiovascular;  Laterality: N/A;   MASS EXCISION N/A 06/29/2013   Procedure:  WIDE LOCAL EXCISION OF POSTERIOR NECK ABSCESS;  Surgeon: Ralene Ok, MD;  Location: [facility]  OR;  Service: General;  Laterality: N/A;   REPAIR KNEE LIGAMENT Left    "fixed ligaments and chipped patella"   right transmetatarsal amputation       Home Medications:  No current facility-administered medications on file prior to encounter.   Current Outpatient Medications on File Prior to Encounter  Medication Sig Dispense Refill   albuterol (VENTOLIN HFA) 108 (90 Base) MCG/ACT inhaler INHALE 1 TO 2 PUFFS BY MOUTH EVERY SIX HOURS AS NEEDED FOR WHEEZING OR SHORTNESS OF BREATH (Patient taking differently: Inhale 1 puff into the lungs every 6 (six) hours as needed for shortness of breath.) 6.7 g 4   amLODipine (NORVASC) 10 MG tablet Take 1 tablet by mouth daily.     aspirin EC 81 MG tablet Take 1 tablet (81 mg total) by mouth daily. Swallow whole. 90 tablet 3   atorvastatin (LIPITOR) 80 MG tablet Take 1 tablet (80 mg total) by mouth daily. 90 tablet 3   Cholecalciferol 1000 units tablet Take 1 tablet (1,000 Units total) by mouth daily. 90 tablet 0   Ferrous Sulfate (IRON) 325 (65 Fe) MG TABS Take 1 tablet (325 mg total) by mouth every other day. 45 tablet 0   gabapentin (NEURONTIN) 600 MG tablet  Take 2 tablets (1,200 mg total) by mouth 3 (three) times daily. 180 tablet 4   insulin degludec (TRESIBA FLEXTOUCH) 200 UNIT/ML FlexTouch Pen Inject 90 Units into the skin 2 (two) times daily. (Patient taking differently: Inject 100 Units into the skin 2 (two) times daily.) 30 mL 3   metoprolol succinate (TOPROL-XL) 25 MG 24 hr tablet Take 25 mg by mouth daily.     NOVOLOG FLEXPEN 100 UNIT/ML FlexPen INJECT 40 UNITS SUBCUTANEOUSLY 3 TIMES DAILY WITH MEALS (Patient taking differently: Inject 50 Units into the skin 3 (three) times daily with meals.) 45 mL 3   omeprazole (PRILOSEC) 40 MG capsule Take 1 capsule (40 mg total) by mouth daily. 30 capsule 3   ondansetron (ZOFRAN-ODT) 4 MG disintegrating tablet Dissolve 1 tablet under the tongue every 8 hours as needed for nausea. (Patient taking differently: Take 4 mg by mouth every 8 (eight) hours as needed for vomiting or nausea.) 10 tablet 0   oxyCODONE-acetaminophen (PERCOCET) 7.5-325 MG tablet Take 1 tablet by mouth every 8 (eight) hours as needed for severe pain. 90 tablet 0   potassium chloride SA (KLOR-CON M20) 20 MEQ tablet Take 1 tablet (20 mEq total) by mouth daily. 30 tablet 6   ramelteon (ROZEREM) 8 MG tablet TAKE 1 TABLET BY MOUTH AT BEDTIME (Patient taking differently: Take 8 mg by mouth at bedtime.) 30 tablet 1   rOPINIRole (REQUIP) 1 MG tablet Take 1 tablet (1 mg total) by mouth at bedtime. 90 tablet 0   SYMBICORT 160-4.5 MCG/ACT inhaler INHALE 2 PUFFS INTO THE LUNGS 2 TIMES DAILY (Patient taking differently: Inhale 2 puffs into the lungs 2 (two) times daily.) 10.2 g 4   Tiotropium Bromide Monohydrate (SPIRIVA RESPIMAT) 1.25 MCG/ACT AERS Inhale 2 puffs into the lungs daily. 1 each 0   torsemide (DEMADEX) 20 MG tablet Take 4 tablets (80 mg total) by mouth daily. 749 tablet 3   TRULICITY 1.5 SW/9.6PR SOPN inject 1.5mg  into THE SKIN ONCE A WEEK (Patient taking differently: Inject 1.75 mg as directed once a week.) 2 mL 5   Accu-Chek FastClix  Lancets MISC USE DAILY AS INSTRUCTED 102 each 3   Accu-Chek Softclix Lancets lancets Use to check blood glucose four times daily 100 each 12  Continuous Blood Gluc Sensor (DEXCOM G6 SENSOR) MISC Inject 1 applicator into the skin as directed. Change sensor every 10 days. 3 each 11   Continuous Blood Gluc Transmit (DEXCOM G6 TRANSMITTER) MISC Inject 1 Device into the skin as directed. Reuse 8 times with sensor changes. 1 each 3   GNP ULTICARE PEN NEEDLES 32G X 4 MM MISC USE TO inject insulin 2 TIMES DAILY 100 each 2   Misc. Devices (TRANSFER BENCH) MISC Use daily 1 each 0   oxyCODONE-acetaminophen (PERCOCET) 7.5-325 MG tablet Take 1 tablet by mouth every 8 (eight) hours as needed for severe pain. (Patient not taking: No sig reported) 90 tablet 0    Social History: Social History   Tobacco Use   Smoking status: Former    Packs/day: 0.25    Years: 15.00    Pack years: 3.75    Types: Cigarettes    Quit date: 12/06/2005    Years since quitting: 15.8   Smokeless tobacco: Never   Tobacco comments:    smokes for a couple of months  Vaping Use   Vaping Use: Never used  Substance Use Topics   Alcohol use: No    Alcohol/week: 0.0 standard drinks   Drug use: Not Currently    Comment: OD attempts on home meds     For any additional social history documentation, please refer to relevant sections of EMR.  Family History: Family History  Problem Relation Age of Onset   Diabetes Mother    Hyperlipidemia Mother    Depression Mother    GER disease Mother    Allergic rhinitis Mother    Restless legs syndrome Mother    Heart attack Paternal Uncle    Heart disease Paternal Grandmother    Heart attack Paternal Grandmother    Heart attack Paternal Grandfather    Heart disease Paternal Grandfather    Heart attack Father    Migraines Sister    Cancer Maternal Grandmother        COLON   Hypertension Maternal Grandmother    Hyperlipidemia Maternal Grandmother    Diabetes Maternal Grandmother     Other Maternal Grandfather        GUN SHOT   Anxiety disorder Sister    Asthma Child     Allergies: Allergies  Allergen Reactions   Cefepime Other (See Comments)    AKI, see records from [employer] hospitalization in January 2020.   Other reaction(s): Other (See Comments)  Note pt has tolerated Rocephin and Keflex   Kiwi Extract Shortness Of Breath, Swelling and Anaphylaxis   Morphine And Related Nausea And Vomiting   Nalbuphine     Other reaction(s): Hallucinations, Other (See Comments) "FEELS LIKE SOMETHING CRAWLING ON ME" Reaction:  Nervousness "FEELS LIKE SOMETHING CRAWLING ON ME" Reaction:  Nervousness Reaction:  Nervousness "FEELS LIKE SOMETHING CRAWLING ON ME"    Trental [Pentoxifylline] Nausea And Vomiting   Toradol [Ketorolac Tromethamine] Other (See Comments)    Feels like something is crawling on me   Morphine Nausea And Vomiting   Nubain [Nalbuphine Hcl] Other (See Comments)    "FEELS LIKE SOMETHING CRAWLING ON ME"      Physical Exam: BP 121/77   Pulse 84   Temp 98.8 F (37.1 C) (Oral)   Resp 13   SpO2 91%  Exam: General: Anxious appearing, laying in bed HEENT: NCAT Cardiovascular: RRR no murmurs rubs or gallops Respiratory: CTAB no wheese rales or crackles on room air Abdomen: nontender to palpation, soft, obese habitus Extremities: L  BKA, R ankle amputated Skin: Left groin abscess present, warm to touch, erythema present, tender to mild palpation. Neuro: No focal deficits, wheelchair bound  Labs and Imaging:  CBC    Component Value Date/Time   WBC 16.7 (H) 10/16/2021 0950   RBC 3.64 (L) 10/16/2021 0950   HGB 12.2 10/16/2021 1145   HGB 12.4 04/24/2021 1119   HCT 36.0 10/16/2021 1145   HCT 39.7 04/24/2021 1119   PLT 338 10/16/2021 0950   PLT 453 (H) 04/24/2021 1119   MCV 94.5 10/16/2021 0950   MCV 96 04/24/2021 1119   MCH 31.0 10/16/2021 0950   MCHC 32.8 10/16/2021 0950   RDW 13.9 10/16/2021 0950   RDW 13.6 04/24/2021 1119   LYMPHSABS  2.8 04/27/2021 0632   LYMPHSABS 4.5 (H) 09/19/2018 0928   MONOABS 1.0 04/27/2021 0632   EOSABS 0.2 04/27/2021 0632   EOSABS 0.2 09/19/2018 0928   BASOSABS 0.0 04/27/2021 0632   BASOSABS 0.1 09/19/2018 0928     @CMET @  Urinalysis    Component Value Date/Time   COLORURINE YELLOW 04/14/2021 1625   APPEARANCEUR CLOUDY (A) 04/14/2021 1625   LABSPEC 1.012 04/14/2021 1625   PHURINE 6.0 04/14/2021 1625   GLUCOSEU >=500 (A) 04/14/2021 1625   HGBUR SMALL (A) 04/14/2021 1625   BILIRUBINUR negative 09/29/2021 1220   KETONESUR negative 09/29/2021 1220   KETONESUR NEGATIVE 04/14/2021 1625   PROTEINUR negative 09/29/2021 1220   PROTEINUR NEGATIVE 04/14/2021 1625   UROBILINOGEN 0.2 09/29/2021 1220   UROBILINOGEN 0.2 08/22/2015 0225   NITRITE Negative 09/29/2021 1220   NITRITE NEGATIVE 04/14/2021 1625   LEUKOCYTESUR Negative 09/29/2021 1220   LEUKOCYTESUR LARGE (A) 04/14/2021 1625      CT PELVIS W CONTRAST  Result Date: 10/16/2021 CLINICAL DATA:  Abdominal abscess/infection suspected. EXAM: CT PELVIS WITH CONTRAST TECHNIQUE: Multidetector CT imaging of the pelvis was performed using the standard protocol following the bolus administration of intravenous contrast. CONTRAST:  148mL OMNIPAQUE IOHEXOL 350 MG/ML SOLN COMPARISON:  CT examination dated April 03, 2021 FINDINGS: Urinary Tract:  No abnormality visualized. Bowel:  Unremarkable visualized pelvic bowel loops. Vascular/Lymphatic: No pathologically enlarged lymph nodes. No significant vascular abnormality seen. Reproductive:  No mass or other significant abnormality Skin/soft tissues: There is a peripherally enhancing soft tissue collection with gas in the subcutaneous soft tissues of the left groin measuring at least 4.0 x 11.0 x 12.4 cm, consistent with abscess. Musculoskeletal: No suspicious bone lesions identified. IMPRESSION: Left groin soft tissue collection with gas measuring at least 4.0 x 11.0 x 12.4 cm consistent with abscess.  Surgical consultation for further management is recommended. Electronically Signed   By: Keane Police D.O.   On: 10/16/2021 14:24   DG Chest Portable 1 View  Result Date: 10/16/2021 CLINICAL DATA:  Chest pain. EXAM: PORTABLE CHEST 1 VIEW COMPARISON:  May 05, 2021. FINDINGS: The heart size and mediastinal contours are within normal limits. Both lungs are clear. The visualized skeletal structures are unremarkable. IMPRESSION: No active disease. Electronically Signed   By: Marijo Conception M.D.   On: 10/16/2021 09:47     Gerrit Heck, MD Family Medicine PGY-1 Service Pager [phone]    FPTS Upper-Level Resident Addendum   I have independently interviewed and examined the patient. I have discussed the above with the original author and agree with their documentation. My edits for correction/addition/clarification are in within the document. Please see also any attending notes.   Shary Key, DO PGY-2, [place] Service pager: [phone]  text pages welcome through [facility])

## 2021-10-16 NOTE — Consult Note (Signed)
Consult Note  [name] 1976-09-18  [id number].    Requesting MD: Dr. Almyra Free Chief Complaint/Reason for Consult: abscess  HPI:  45 year old female with medical history significant for type II DM, anemia, COPD, GERD, OSA on CPAP, peripheral neuropathy, SVT, prior abscesses status postsurgical resection, who presents to the [facility] emergency department with complaints of hyperglycemia and malaise as well as left groin abscess.  She noticed a boil about a week ago but did not complain of much pain.  She put a warm compress on this.  She presented today mostly with chest pain and some SOB, but did mention this area as well.  Her CBGs at home this week have been over 500 consistently and are normally in the 300s.  She denies any fevers, chills, abdominal pain, but she has had some N/V.  She last ate and drank yesterday.    Work-up in the ED significant for blood glucose of 474, WBC of 16.7, and a CT scan that revealed a L groin abscess with air present.  We have been asked to see her for further evaluation of this area.   ROS: ROS: Please see HPI, otherwise all other systems have been reviewed and are negative, except she wears 3L of O2 at night.    Family History  Problem Relation Age of Onset   Diabetes Mother    Hyperlipidemia Mother    Depression Mother    GER disease Mother    Allergic rhinitis Mother    Restless legs syndrome Mother    Heart attack Paternal Uncle    Heart disease Paternal Grandmother    Heart attack Paternal Grandmother    Heart attack Paternal Grandfather    Heart disease Paternal Grandfather    Heart attack Father    Migraines Sister    Cancer Maternal Grandmother        COLON   Hypertension Maternal Grandmother    Hyperlipidemia Maternal Grandmother    Diabetes Maternal Grandmother    Other Maternal Grandfather        GUN SHOT   Anxiety disorder Sister    Asthma Child     Past Medical History:  Diagnosis Date   Acute osteomyelitis of ankle  or foot, left ([place]) 01/31/2018   Alveolar hypoventilation    Anemia    not on iron pill   Asthma    Bipolar 2 disorder ([name])    Carpal tunnel syndrome on right    recurrent   Cellulitis 08/2010-08/2011   Chronic pain    COPD (chronic obstructive pulmonary disease) ([name])    Symbicort daily and Proventil as needed   Costochondritis    Diabetes mellitus type II, uncontrolled 2000   Type 2, Uncontrolled.Takes Lantus daily.Fasting blood sugar runs 150   Drug-seeking behavior    GERD (gastroesophageal reflux disease)    takes Pantoprazole and Zantac daily   HLD (hyperlipidemia)    takes Atorvastatin daily   Hypertension    takes Lisinopril and Coreg daily   Morbid obesity ([name])    Nocturia    OSA on CPAP    Peripheral neuropathy    takes Gabapentin daily   Pneumonia    "walking" several yrs ago and as a baby (12/05/2018)   Rectal fissure    Restless leg    SVT (supraventricular tachycardia) ([place])    Syncope 02/25/2016   Urinary frequency    Urinary incontinence 10/23/2020   Varicose veins    Right medial thigh and Left leg  Past Surgical History:  Procedure Laterality Date   AMPUTATION Left 02/01/2018   Procedure: LEFT FOURTH AND 5TH TOE RAY AMPUTATION;  Surgeon: [name], MD;  Location: [place];  Service: Orthopedics;  Laterality: Left;   AMPUTATION Left 03/03/2018   Procedure: LEFT BELOW KNEE AMPUTATION;  Surgeon: [name], MD;  Location: [place];  Service: Orthopedics;  Laterality: Left;   CARPAL TUNNEL RELEASE Bilateral    CESAREAN SECTION  2007   CORONARY ANGIOGRAPHY N/A 11/26/2020   Procedure: CORONARY ANGIOGRAPHY;  Surgeon: Sherren Mocha, MD;  Location: [place] [facility];  Service: Cardiovascular;  Laterality: N/A;   INCISION AND DRAINAGE PERIRECTAL ABSCESS Left 05/18/2019   Procedure: IRRIGATION AND DEBRIDEMENT OF PANNIS ABSCESS, POSSIBLE DEBRIDEMENT OF BUTTOCK WOUND;  Surgeon: Donnie Mesa, MD;  Location: [place];  Service: General;  Laterality: Left;    IRRIGATION AND DEBRIDEMENT BUTTOCKS Left 05/17/2019   Procedure: IRRIGATION AND DEBRIDEMENT BUTTOCKS;  Surgeon: Donnie Mesa, MD;  Location: [place];  Service: General;  Laterality: Left;   KNEE ARTHROSCOPY Right 07/17/2010   LEFT HEART CATHETERIZATION WITH CORONARY ANGIOGRAM N/A 07/27/2012   Procedure: LEFT HEART CATHETERIZATION WITH CORONARY ANGIOGRAM;  Surgeon: Sherren Mocha, MD;  Location: [facility] C[facility];  Service: Cardiovascular;  Laterality: N/A;   MASS EXCISION N/A 06/29/2013   Procedure:  WIDE LOCAL EXCISION OF POSTERIOR NECK ABSCESS;  Surgeon: Ralene Ok, MD;  Location: [place];  Service: General;  Laterality: N/A;   REPAIR KNEE LIGAMENT Left    "fixed ligaments and chipped patella"   right transmetatarsal amputation       Social History:  reports that she quit smoking about 15 years ago. Her smoking use included cigarettes. She has a 3.75 pack-year smoking history. She has never used smokeless tobacco. She reports that she does not currently use drugs. She reports that she does not drink alcohol.  Allergies:  Allergies  Allergen Reactions   Cefepime Other (See Comments)    AKI, see records from [place] hospitalization in January 2020.   Other reaction(s): Other (See Comments)  Note pt has tolerated Rocephin and Keflex   Kiwi Extract Shortness Of Breath, Swelling and Anaphylaxis   Morphine And Related Nausea And Vomiting   Nalbuphine     Other reaction(s): Hallucinations, Other (See Comments) "FEELS LIKE SOMETHING CRAWLING ON ME" Reaction:  Nervousness "FEELS LIKE SOMETHING CRAWLING ON ME" Reaction:  Nervousness Reaction:  Nervousness "FEELS LIKE SOMETHING CRAWLING ON ME"    Trental [Pentoxifylline] Nausea And Vomiting   Toradol [Ketorolac Tromethamine] Other (See Comments)    Feels like something is crawling on me   Morphine Nausea And Vomiting   Nubain [Nalbuphine Hcl] Other (See Comments)    "FEELS LIKE SOMETHING CRAWLING ON ME"    (Not in a hospital  admission)   Blood pressure (!) 104/53, pulse 87, temperature 98.8 F (37.1 C), temperature source Oral, resp. rate 17, SpO2 97 %. Physical Exam:  General: pleasant, WD, morbidly obesefemale who is laying in bed in NAD HEENT: head is normocephalic, atraumatic.  Sclera are noninjected.  Pupils equal and round. EOMs intact.  Ears and nose without any masses or lesions.  Mouth is pink and moist Heart: regular, rate, and rhythm.  Normal s1,s2. No obvious murmurs, gallops, or rubs noted.  Palpable radial and pedal pulses bilaterally Lungs: CTAB, no wheezes, rhonchi, or rales noted.  Respiratory effort nonlabored Abd: soft, NT, ND/morbidly obese, +BS, no masses, hernias, unable to evaluate for organomegaly secondary to body habitus. MSK: no cyanosis,  clubbing in all 4 extremities.  BKA of LLE.  Toe amputations of R foot. Skin: warm and dry with no masses, lesions, or rashes, except in her L groin she has a fluctuant area with some erythema and induration.  Due to body habitus, this area can not be fully evaluated in its entirety.  Neuro: Cranial nerves 2-12 grossly intact, sensation is normal throughout Psych: A&Ox3 with an appropriate affect.   Results for orders placed or performed during the hospital encounter of 10/16/21 (from the past 48 hour(s))  Resp Panel by RT-PCR (Flu A&B, Covid) Nasopharyngeal Swab     Status: None   Collection Time: 10/16/21  9:23 AM   Specimen: Nasopharyngeal Swab; Nasopharyngeal(NP) swabs in vial transport medium  Result Value Ref Range   SARS Coronavirus 2 by RT PCR NEGATIVE NEGATIVE    Comment: (NOTE) SARS-CoV-2 target nucleic acids are NOT DETECTED.  The SARS-CoV-2 RNA is generally detectable in upper respiratory specimens during the acute phase of infection. The lowest concentration of SARS-CoV-2 viral copies this assay can detect is 138 copies/mL. A negative result does not preclude SARS-Cov-2 infection and should not be used as the sole basis for treatment  or other patient management decisions. A negative result may occur with  improper specimen collection/handling, submission of specimen other than nasopharyngeal swab, presence of viral mutation(s) within the areas targeted by this assay, and inadequate number of viral copies(<138 copies/mL). A negative result must be combined with clinical observations, patient history, and epidemiological information. The expected result is Negative.  Fact Sheet for Patients:  EntrepreneurPulse.com.au  Fact Sheet for Healthcare Providers:  IncredibleEmployment.be  This test is no t yet approved or cleared by the [place] FDA and  has been authorized for detection and/or diagnosis of SARS-CoV-2 by FDA under an Emergency Use Authorization (EUA). This EUA will remain  in effect (meaning this test can be used) for the duration of the COVID-19 declaration under Section 564(b)(1) of the Act, 21 U.S.C.section 360bbb-3(b)(1), unless the authorization is terminated  or revoked sooner.       Influenza A by PCR NEGATIVE NEGATIVE   Influenza B by PCR NEGATIVE NEGATIVE    Comment: (NOTE) The Xpert Xpress SARS-CoV-2/FLU/RSV plus assay is intended as an aid in the diagnosis of influenza from Nasopharyngeal swab specimens and should not be used as a sole basis for treatment. Nasal washings and aspirates are unacceptable for Xpert Xpress SARS-CoV-2/FLU/RSV testing.  Fact Sheet for Patients: EntrepreneurPulse.com.au  Fact Sheet for Healthcare Providers: IncredibleEmployment.be  This test is not yet approved or cleared by the [place] FDA and has been authorized for detection and/or diagnosis of SARS-CoV-2 by FDA under an Emergency Use Authorization (EUA). This EUA will remain in effect (meaning this test can be used) for the duration of the COVID-19 declaration under Section 564(b)(1) of the Act, 21 U.S.C. section  360bbb-3(b)(1), unless the authorization is terminated or revoked.  Performed at [facility], [place] [address] [place],  [address]   Basic metabolic panel     Status: Abnormal   Collection Time: 10/16/21  9:50 AM  Result Value Ref Range   Sodium 125 (L) 135 - 145 mmol/L   Potassium 3.4 (L) 3.5 - 5.1 mmol/L   Chloride 88 (L) 98 - 111 mmol/L   CO2 25 22 - 32 mmol/L   Glucose, Bld 474 (H) 70 - 99 mg/dL    Comment: Glucose reference range applies only to samples taken after fasting for at least 8  hours.   BUN 23 (H) 6 - 20 mg/dL   Creatinine, Ser 1.52 (H) 0.44 - 1.00 mg/dL   Calcium 7.9 (L) 8.9 - 10.3 mg/dL   GFR, Estimated 43 (L) >60 mL/min    Comment: (NOTE) Calculated using the CKD-EPI Creatinine Equation (2021)    Anion gap 12 5 - 15    Comment: Performed at [facility] [address], [place], [place] [address]  CBC     Status: Abnormal   Collection Time: 10/16/21  9:50 AM  Result Value Ref Range   WBC 16.7 (H) 4.0 - 10.5 K/uL   RBC 3.64 (L) 3.87 - 5.11 MIL/uL   Hemoglobin 11.3 (L) 12.0 - 15.0 g/dL   HCT 34.4 (L) 36.0 - 46.0 %   MCV 94.5 80.0 - 100.0 fL   MCH 31.0 26.0 - 34.0 pg   MCHC 32.8 30.0 - 36.0 g/dL   RDW 13.9 11.5 - 15.5 %   Platelets 338 150 - 400 K/uL   nRBC 0.0 0.0 - 0.2 %    Comment: Performed at [facility], [place] [address], [place], [place] [address]  Troponin I (High Sensitivity)     Status: Abnormal   Collection Time: 10/16/21  9:50 AM  Result Value Ref Range   Troponin I (High Sensitivity) 22 (H) <18 ng/L    Comment: (NOTE) Elevated high sensitivity troponin I (hsTnI) values and significant  changes across serial measurements may suggest ACS but many other  chronic and acute conditions are known to elevate hsTnI results.  Refer to the "Links" section for chest pain algorithms and additional  guidance. Performed at [facility], [place] [address], [place], [place] [address]   Troponin I (High Sensitivity)     Status:  Abnormal   Collection Time: 10/16/21 11:26 AM  Result Value Ref Range   Troponin I (High Sensitivity) 50 (H) <18 ng/L    Comment: RESULT CALLED TO, READ BACK BY AND VERIFIED WITH: P.PULLIAM,RN 10/16/2021 1347 DAVISB (NOTE) Elevated high sensitivity troponin I (hsTnI) values and significant  changes across serial measurements may suggest ACS but many other  chronic and acute conditions are known to elevate hsTnI results.  Refer to the Links section for chest pain algorithms and additional  guidance. Performed at [facility], [place] [address] [place],  [address]   I-Stat venous blood gas, [facility] ED)     Status: Abnormal   Collection Time: 10/16/21 11:45 AM  Result Value Ref Range   pH, Ven 7.400 7.250 - 7.430   pCO2, Ven 45.2 44.0 - 60.0 mmHg   pO2, Ven 41.0 32.0 - 45.0 mmHg   Bicarbonate 28.0 20.0 - 28.0 mmol/L   TCO2 29 22 - 32 mmol/L   O2 Saturation 76.0 %   Acid-Base Excess 3.0 (H) 0.0 - 2.0 mmol/L   Sodium 127 (L) 135 - 145 mmol/L   Potassium 3.5 3.5 - 5.1 mmol/L   Calcium, Ion 1.00 (L) 1.15 - 1.40 mmol/L   HCT 36.0 36.0 - 46.0 %   Hemoglobin 12.2 12.0 - 15.0 g/dL   Sample type VENOUS    *Note: Due to a large number of results and/or encounters for the requested time period, some results have not been displayed. A complete set of results can be found in Results Review.   CT PELVIS W CONTRAST  Result Date: 10/16/2021 CLINICAL DATA:  Abdominal abscess/infection suspected. EXAM: CT PELVIS WITH CONTRAST TECHNIQUE: Multidetector CT imaging of the pelvis  was performed using the standard protocol following the bolus administration of intravenous contrast. CONTRAST:  178mL OMNIPAQUE IOHEXOL 350 MG/ML SOLN COMPARISON:  CT examination dated April 03, 2021 FINDINGS: Urinary Tract:  No abnormality visualized. Bowel:  Unremarkable visualized pelvic bowel loops. Vascular/Lymphatic: No pathologically enlarged lymph nodes. No significant vascular abnormality seen. Reproductive:  No  mass or other significant abnormality Skin/soft tissues: There is a peripherally enhancing soft tissue collection with gas in the subcutaneous soft tissues of the left groin measuring at least 4.0 x 11.0 x 12.4 cm, consistent with abscess. Musculoskeletal: No suspicious bone lesions identified. IMPRESSION: Left groin soft tissue collection with gas measuring at least 4.0 x 11.0 x 12.4 cm consistent with abscess. Surgical consultation for further management is recommended. Electronically Signed   By: Keane Police D.O.   On: 10/16/2021 14:24   DG Chest Portable 1 View  Result Date: 10/16/2021 CLINICAL DATA:  Chest pain. EXAM: PORTABLE CHEST 1 VIEW COMPARISON:  May 05, 2021. FINDINGS: The heart size and mediastinal contours are within normal limits. Both lungs are clear. The visualized skeletal structures are unremarkable. IMPRESSION: No active disease. Electronically Signed   By: Marijo Conception M.D.   On: 10/16/2021 09:47      Assessment/Plan L groin abscess The patient will need to go to the OR tonight for I&D of this abscess.  She has had prior I&Ds in the past and is aware of what this procedure entails.  She is NPO.  She has been started on zosyn/vanc.  We discussed the need for WD dressing changes and the need for possible further debridement in the future.  She agrees and understands.   FEN:  NPO/IVFs LP:FXTKW/IOXB VTE: ok to start prophylaxis post op  DM, likely DKA COPD - wears 3L O2 nightly Morbidly obese Chronic pain Peripheral neuropathy secondary to DM --per medicine--   Henreitta Cea, [facility] 10/16/2021, 2:41 PM Please see Amion for pager number during day hours 7:00am-4:30pm

## 2021-10-16 NOTE — Progress Notes (Signed)
Pt is not able to provide urine for pregnancy test.  Dr Ermalene Postin was notified.

## 2021-10-16 NOTE — ED Triage Notes (Signed)
Pt with ongoing abscess to L groin, diarrhea since Saturday, and woke up this morning with chest pain. CBG 586 with EMS despite her taking insulin this morning and missing a few meals. 324 ASA given by EMS.

## 2021-10-16 NOTE — ED Provider Notes (Signed)
[facility] EMERGENCY DEPARTMENT Provider Note   CSN: [id number] Arrival date & time: 10/16/21  5631     History Chief Complaint  Patient presents with   Abscess   Chest Pain   Diarrhea   Hyperglycemia    [name] is a 45 y.o. female with medical history of DMII diagnosed 2001 who presents via EMS today for ongoing malaise type symptoms since Sunday and hyperglycemia. Patient states that since Sunday she has had nausea, diarrhea, loss of appetite, cold intolerance, weakness and lightheadedness. Patient has also had increased blood glucose readings up to 586 today when EMS checked. Patient continues that day prior to symptom onset her daughter, who is her primary caretaker, noticed an abscess in her left groin. Patient denies any drainage of abscess but states it is hot and tender to touch. Patient denies abdominal pain, fevers, vomiting, excessive urination, excessive thirst or excessive hunger.   Abscess Associated symptoms: headaches and nausea   Associated symptoms: no fever and no vomiting   Chest Pain Associated symptoms: headache and nausea   Associated symptoms: no abdominal pain, no fever, no shortness of breath and no vomiting   Diarrhea Associated symptoms: headaches   Associated symptoms: no abdominal pain, no fever and no vomiting   Hyperglycemia Associated symptoms: chest pain and nausea   Associated symptoms: no abdominal pain, no fever, no increased thirst, no polyuria, no shortness of breath and no vomiting       Past Medical History:  Diagnosis Date   Acute osteomyelitis of ankle or foot, left ([name]) 01/31/2018   Alveolar hypoventilation    Anemia    not on iron pill   Asthma    Bipolar 2 disorder ([name])    Carpal tunnel syndrome on right    recurrent   Cellulitis 08/2010-08/2011   Chronic pain    COPD (chronic obstructive pulmonary disease) ([name])    Symbicort daily and Proventil as needed   Costochondritis    Diabetes mellitus type  II, uncontrolled 2000   Type 2, Uncontrolled.Takes Lantus daily.Fasting blood sugar runs 150   Drug-seeking behavior    GERD (gastroesophageal reflux disease)    takes Pantoprazole and Zantac daily   HLD (hyperlipidemia)    takes Atorvastatin daily   Hypertension    takes Lisinopril and Coreg daily   Morbid obesity ([name])    Nocturia    OSA on CPAP    Peripheral neuropathy    takes Gabapentin daily   Pneumonia    "walking" several yrs ago and as a baby (12/05/2018)   Rectal fissure    Restless leg    SVT (supraventricular tachycardia) ([name])    Syncope 02/25/2016   Urinary frequency    Urinary incontinence 10/23/2020   Varicose veins    Right medial thigh and Left leg     Patient Active Problem List   Diagnosis Date Noted   Impaired mobility 07/07/2021   Chest pain with low risk for cardiac etiology 05/11/2021   Morbid obesity with BMI of 60.0-69.9, adult ([place]) 04/15/2021   Hypoxia 04/15/2021   UTI (urinary tract infection) 04/15/2021   Chronic pain 04/15/2021   Pneumonia due to COVID-19 virus 04/14/2021   Urinary incontinence 10/23/2020   Elevated troponin    Acute respiratory failure with hypoxia and hypercarbia ([place]) 06/15/2020   Chronic respiratory failure with hypoxia ([place]) 06/14/2020   Blister of foot 04/21/2020   Exposure to mold 04/21/2020   Constipation 04/03/2020   Left shoulder pain 06/23/2019  Dysuria 06/14/2019   Hyperglycemia due to diabetes mellitus ([place])    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   Allergic rhinitis 03/06/2018   Class 3 severe obesity due to excess calories with serious comorbidity and body mass index (BMI) of 50.0 to 59.9 in adult ([name])    Decreased pedal pulses 06/10/2017   Unilateral primary osteoarthritis, right knee 10/22/2016   Primary osteoarthritis of first carpometacarpal joint of left hand 07/30/2016   Diabetic neuropathy ([place]) 07/14/2016   Nausea 03/17/2016   De Quervain's tenosynovitis, bilateral 11/01/2015   Vitamin D  deficiency 09/05/2015   Recurrent candidiasis of vagina 09/05/2015   Varicose veins of leg with complications 19/14/7829   Restless leg syndrome 10/17/2014   Chronic sinusitis 07/18/2014   Insomnia 08/14/2013   Encounter for chronic pain management 06/30/2013   HLD (hyperlipidemia) 11/19/2012   Angina pectoris ([place]) 06/27/2012   Abscess of skin and subcutaneous tissue 11/04/2011   Right carpal tunnel syndrome 09/01/2011   Bilateral knee pain 09/01/2011   Type 2 diabetes mellitus with neurological complications ([place]) 56/21/3086   Morbid obesity ([place]) 05/22/2008   Obesity hypoventilation syndrome ([place]) 05/22/2008   Mood disorder ([place]) 05/22/2008   Obstructive sleep apnea 05/22/2008   Hypertension 05/22/2008   Asthma 05/22/2008   GERD 05/22/2008    Past Surgical History:  Procedure Laterality Date   AMPUTATION Left 02/01/2018   Procedure: LEFT FOURTH AND 5TH TOE RAY AMPUTATION;  Surgeon: [name], MD;  Location: [place];  Service: Orthopedics;  Laterality: Left;   AMPUTATION Left 03/03/2018   Procedure: LEFT BELOW KNEE AMPUTATION;  Surgeon: [name], MD;  Location: [place];  Service: Orthopedics;  Laterality: Left;   CARPAL TUNNEL RELEASE Bilateral    CESAREAN SECTION  2007   CORONARY ANGIOGRAPHY N/A 11/26/2020   Procedure: CORONARY ANGIOGRAPHY;  Surgeon: Sherren Mocha, MD;  Location: [place] [facility];  Service: Cardiovascular;  Laterality: N/A;   INCISION AND DRAINAGE PERIRECTAL ABSCESS Left 05/18/2019   Procedure: IRRIGATION AND DEBRIDEMENT OF PANNIS ABSCESS, POSSIBLE DEBRIDEMENT OF BUTTOCK WOUND;  Surgeon: Donnie Mesa, MD;  Location: [place];  Service: General;  Laterality: Left;   IRRIGATION AND DEBRIDEMENT BUTTOCKS Left 05/17/2019   Procedure: IRRIGATION AND DEBRIDEMENT BUTTOCKS;  Surgeon: Donnie Mesa, MD;  Location: [place];  Service: General;  Laterality: Left;   KNEE ARTHROSCOPY Right 07/17/2010   LEFT HEART CATHETERIZATION WITH CORONARY ANGIOGRAM N/A 07/27/2012    Procedure: LEFT HEART CATHETERIZATION WITH CORONARY ANGIOGRAM;  Surgeon: Sherren Mocha, MD;  Location: [facility] CATH LAB;  Service: Cardiovascular;  Laterality: N/A;   MASS EXCISION N/A 06/29/2013   Procedure:  WIDE LOCAL EXCISION OF POSTERIOR NECK ABSCESS;  Surgeon: Ralene Ok, MD;  Location: [place];  Service: General;  Laterality: N/A;   REPAIR KNEE LIGAMENT Left    "fixed ligaments and chipped patella"   right transmetatarsal amputation        OB History     Gravida  2   Para  1   Term      Preterm      AB  1   Living  1      SAB  1   IAB      Ectopic      Multiple      Live Births              Family History  Problem Relation Age of Onset   Diabetes Mother    Hyperlipidemia Mother    Depression Mother  GER disease Mother    Allergic rhinitis Mother    Restless legs syndrome Mother    Heart attack Paternal Uncle    Heart disease Paternal Grandmother    Heart attack Paternal Grandmother    Heart attack Paternal Grandfather    Heart disease Paternal Grandfather    Heart attack Father    Migraines Sister    Cancer Maternal Grandmother        COLON   Hypertension Maternal Grandmother    Hyperlipidemia Maternal Grandmother    Diabetes Maternal Grandmother    Other Maternal Grandfather        GUN SHOT   Anxiety disorder Sister    Asthma Child     Social History   Tobacco Use   Smoking status: Former    Packs/day: 0.25    Years: 15.00    Pack years: 3.75    Types: Cigarettes    Quit date: 12/06/2005    Years since quitting: 15.8   Smokeless tobacco: Never   Tobacco comments:    smokes for a couple of months  Vaping Use   Vaping Use: Never used  Substance Use Topics   Alcohol use: No    Alcohol/week: 0.0 standard drinks   Drug use: Not Currently    Comment: OD attempts on home meds      Home Medications Prior to Admission medications   Medication Sig Start Date End Date Taking? Authorizing Provider  albuterol (VENTOLIN HFA) 108 (90  Base) MCG/ACT inhaler INHALE 1 TO 2 PUFFS BY MOUTH EVERY SIX HOURS AS NEEDED FOR WHEEZING OR SHORTNESS OF BREATH Patient taking differently: Inhale 1 puff into the lungs every 6 (six) hours as needed for shortness of breath. 07/16/21  Yes [name], MD  amLODipine (NORVASC) 10 MG tablet Take 1 tablet by mouth daily. 12/28/18  Yes [provider]  aspirin EC 81 MG tablet Take 1 tablet (81 mg total) by mouth daily. Swallow whole. 11/19/20  Yes Sherren Mocha, MD  atorvastatin (LIPITOR) 80 MG tablet Take 1 tablet (80 mg total) by mouth daily. 02/11/21  Yes [name], MD  Cholecalciferol 1000 units tablet Take 1 tablet (1,000 Units total) by mouth daily. 04/05/17  Yes [name], MD  Ferrous Sulfate (IRON) 325 (65 Fe) MG TABS Take 1 tablet (325 mg total) by mouth every other day. 03/31/21  Yes [name], MD  gabapentin (NEURONTIN) 600 MG tablet Take 2 tablets (1,200 mg total) by mouth 3 (three) times daily. 08/12/21  Yes [name], MD  insulin degludec (TRESIBA FLEXTOUCH) 200 UNIT/ML FlexTouch Pen Inject 90 Units into the skin 2 (two) times daily. Patient taking differently: Inject 100 Units into the skin 2 (two) times daily. 06/17/21  Yes [name], MD  metoprolol succinate (TOPROL-XL) 25 MG 24 hr tablet Take 25 mg by mouth daily. 10/23/20  Yes [provider]  NOVOLOG FLEXPEN 100 UNIT/ML FlexPen INJECT 40 UNITS SUBCUTANEOUSLY 3 TIMES DAILY WITH MEALS Patient taking differently: Inject 50 Units into the skin 3 (three) times daily with meals. 09/03/21  Yes [name], MD  omeprazole (PRILOSEC) 40 MG capsule Take 1 capsule (40 mg total) by mouth daily. 11/14/20  Yes Martyn Ehrich, NP  ondansetron (ZOFRAN-ODT) 4 MG disintegrating tablet Dissolve 1 tablet under the tongue every 8 hours as needed for nausea. Patient taking differently: Take 4 mg by mouth every 8 (eight) hours as needed for vomiting or nausea. 06/29/21  Yes  [name], [place]  J, MD  oxyCODONE-acetaminophen (PERCOCET) 7.5-325 MG tablet Take 1 tablet by mouth every 8 (eight) hours as needed for severe pain. 09/29/21  Yes [name], MD  potassium chloride SA (KLOR-CON M20) 20 MEQ tablet Take 1 tablet (20 mEq total) by mouth daily. 09/09/20  Yes Weaver, Scott T, PA-C  ramelteon (ROZEREM) 8 MG tablet TAKE 1 TABLET BY MOUTH AT BEDTIME Patient taking differently: Take 8 mg by mouth at bedtime. 10/06/21  Yes [name], MD  rOPINIRole (REQUIP) 1 MG tablet Take 1 tablet (1 mg total) by mouth at bedtime. 08/12/20  Yes [name], MD  SYMBICORT 160-4.5 MCG/ACT inhaler INHALE 2 PUFFS INTO THE LUNGS 2 TIMES DAILY Patient taking differently: Inhale 2 puffs into the lungs 2 (two) times daily. 07/16/21  Yes [name], MD  Tiotropium Bromide Monohydrate (SPIRIVA RESPIMAT) 1.25 MCG/ACT AERS Inhale 2 puffs into the lungs daily. 11/14/20  Yes Martyn Ehrich, NP  torsemide (DEMADEX) 20 MG tablet Take 4 tablets (80 mg total) by mouth daily. 07/02/21  Yes [name], MD  TRULICITY 1.5 UJ/8.1XB SOPN inject 1.5mg  into THE SKIN ONCE A WEEK Patient taking differently: Inject 1.75 mg as directed once a week. 05/01/21  Yes [name], MD  Accu-Chek FastClix Lancets MISC USE DAILY AS INSTRUCTED 05/18/21   [name], MD  Accu-Chek Softclix Lancets lancets Use to check blood glucose four times daily 04/17/20   Martyn Malay, MD  Continuous Blood Gluc Sensor (DEXCOM G6 SENSOR) MISC Inject 1 applicator into the skin as directed. Change sensor every 10 days. 10/16/20   Leavy Cella, RPH-CPP  Continuous Blood Gluc Transmit (DEXCOM G6 TRANSMITTER) MISC Inject 1 Device into the skin as directed. Reuse 8 times with sensor changes. 09/03/21   [name], MD  GNP ULTICARE PEN NEEDLES 32G X 4 MM MISC USE TO inject insulin 2 TIMES DAILY 10/02/21   [name], MD  Misc. Devices (TRANSFER BENCH) MISC Use  daily 10/29/20   [name], MD  oxyCODONE-acetaminophen (PERCOCET) 7.5-325 MG tablet Take 1 tablet by mouth every 8 (eight) hours as needed for severe pain. Patient not taking: No sig reported 09/29/21   [name], MD    Allergies    Cefepime, Kiwi extract, Morphine and related, Nalbuphine, Trental [pentoxifylline], Toradol [ketorolac tromethamine], Morphine, and Nubain [nalbuphine hcl]  Review of Systems   Review of Systems  Constitutional:  Positive for appetite change. Negative for fever.  HENT:  Negative for sore throat.   Respiratory:  Negative for shortness of breath.   Cardiovascular:  Positive for chest pain.  Gastrointestinal:  Positive for diarrhea and nausea. Negative for abdominal pain and vomiting.  Endocrine: Positive for cold intolerance. Negative for polydipsia, polyphagia and polyuria.  Neurological:  Positive for headaches.  All other systems reviewed and are negative.  Physical Exam Updated Vital Signs BP (!) 104/57   Pulse 86   Temp 98.8 F (37.1 C) (Oral)   Resp 20   SpO2 96%   Physical Exam Constitutional:      Appearance: She is obese. She is ill-appearing. She is not toxic-appearing.  HENT:     Head: Normocephalic.  Eyes:     Pupils: Pupils are equal, round, and reactive to light.  Cardiovascular:     Rate and Rhythm: Normal rate and regular rhythm.     Pulses:          Radial pulses are 2+ on the right  side and 2+ on the left side.  Pulmonary:     Effort: Pulmonary effort is normal. No accessory muscle usage.  Abdominal:     Palpations: Abdomen is soft.     Tenderness: There is no abdominal tenderness.  Musculoskeletal:     Cervical back: Normal range of motion.     Left Lower Extremity: Left leg is amputated below knee.  Skin:    General: Skin is warm and dry.     Capillary Refill: Capillary refill takes less than 2 seconds.     Findings: Abscess and erythema present.       Neurological:     General: No focal  deficit present.     Mental Status: She is alert.    ED Results / Procedures / Treatments   Labs (all labs ordered are listed, but only abnormal results are displayed) Labs Reviewed  BASIC METABOLIC PANEL - Abnormal; Notable for the following components:      Result Value   Sodium 125 (*)    Potassium 3.4 (*)    Chloride 88 (*)    Glucose, Bld 474 (*)    BUN 23 (*)    Creatinine, Ser 1.52 (*)    Calcium 7.9 (*)    GFR, Estimated 43 (*)    All other components within normal limits  CBC - Abnormal; Notable for the following components:   WBC 16.7 (*)    RBC 3.64 (*)    Hemoglobin 11.3 (*)    HCT 34.4 (*)    All other components within normal limits  CBG MONITORING, ED - Abnormal; Notable for the following components:   Glucose-Capillary 317 (*)    All other components within normal limits  I-STAT VENOUS BLOOD GAS, ED - Abnormal; Notable for the following components:   Acid-Base Excess 3.0 (*)    Sodium 127 (*)    Calcium, Ion 1.00 (*)    All other components within normal limits  TROPONIN I (HIGH SENSITIVITY) - Abnormal; Notable for the following components:   Troponin I (High Sensitivity) 22 (*)    All other components within normal limits  TROPONIN I (HIGH SENSITIVITY) - Abnormal; Notable for the following components:   Troponin I (High Sensitivity) 50 (*)    All other components within normal limits  RESP PANEL BY RT-PCR (FLU A&B, COVID) ARPGX2  URINALYSIS, ROUTINE W REFLEX MICROSCOPIC  CBG MONITORING, ED  TROPONIN I (HIGH SENSITIVITY)    EKG EKG Interpretation  Date/Time:  Friday October 16 2021 09:20:22 EST Ventricular Rate:  89 PR Interval:  164 QRS Duration: 102 QT Interval:  386 QTC Calculation: 469 R Axis:   75 Text Interpretation: Normal sinus rhythm Normal ECG Confirmed by Thamas Jaegers ([id number]) on 10/16/2021 1:51:22 PM  Radiology CT PELVIS W CONTRAST  Result Date: 10/16/2021 CLINICAL DATA:  Abdominal abscess/infection suspected. EXAM: CT PELVIS  WITH CONTRAST TECHNIQUE: Multidetector CT imaging of the pelvis was performed using the standard protocol following the bolus administration of intravenous contrast. CONTRAST:  126mL OMNIPAQUE IOHEXOL 350 MG/ML SOLN COMPARISON:  CT examination dated April 03, 2021 FINDINGS: Urinary Tract:  No abnormality visualized. Bowel:  Unremarkable visualized pelvic bowel loops. Vascular/Lymphatic: No pathologically enlarged lymph nodes. No significant vascular abnormality seen. Reproductive:  No mass or other significant abnormality Skin/soft tissues: There is a peripherally enhancing soft tissue collection with gas in the subcutaneous soft tissues of the left groin measuring at least 4.0 x 11.0 x 12.4 cm, consistent with abscess. Musculoskeletal: No suspicious bone lesions  identified. IMPRESSION: Left groin soft tissue collection with gas measuring at least 4.0 x 11.0 x 12.4 cm consistent with abscess. Surgical consultation for further management is recommended. Electronically Signed   By: Keane Police D.O.   On: 10/16/2021 14:24   DG Chest Portable 1 View  Result Date: 10/16/2021 CLINICAL DATA:  Chest pain. EXAM: PORTABLE CHEST 1 VIEW COMPARISON:  May 05, 2021. FINDINGS: The heart size and mediastinal contours are within normal limits. Both lungs are clear. The visualized skeletal structures are unremarkable. IMPRESSION: No active disease. Electronically Signed   By: Marijo Conception M.D.   On: 10/16/2021 09:47    Procedures Procedures   Medications Ordered in ED Medications  piperacillin-tazobactam (ZOSYN) IVPB 3.375 g (3.375 g Intravenous New Bag/Given 10/16/21 1459)  ondansetron (ZOFRAN) injection 4 mg (4 mg Intravenous Given 10/16/21 1127)  sodium chloride 0.9 % bolus 1,000 mL (1,000 mLs Intravenous Bolus 10/16/21 1118)  morphine 4 MG/ML injection 4 mg (4 mg Intravenous Given 10/16/21 1129)  vancomycin (VANCOCIN) 2,500 mg in sodium chloride 0.9 % 500 mL IVPB (2,500 mg Intravenous New Bag/Given 10/16/21  1238)  iohexol (OMNIPAQUE) 350 MG/ML injection 100 mL (100 mLs Intravenous Contrast Given 10/16/21 1306)  morphine 4 MG/ML injection 4 mg (4 mg Intravenous Given 10/16/21 1500)  ondansetron (ZOFRAN) injection 4 mg (4 mg Intravenous Given 10/16/21 1453)    ED Course  I have reviewed the triage vital signs and the nursing notes.  Pertinent labs & imaging results that were available during my care of the patient were reviewed by me and considered in my medical decision making (see chart for details).    MDM Rules/Calculators/A&P                          [id number] who presents via EMS with high blood glucose readings and suspected abscess. Patient presentation was initially concerning for DKA but VBG and BMP did not support this suspicion. Patient delta troponin did appear to be elevated at 50 which is an increase from initial of 22 but patient denies any chest pain. Will order another troponin to trend. Repeat EKG has no abnormal findings. Patient chest xray does not have any abnormal findings. Elevated troponin most likely due to demand ischemia in the setting of infection and hyperglycemia. Patient CT scan of pelvis completed with finding of left groin soft tissue collection with gas measuring at least 4x11x12.4cm consistent with abscess. IV vancomycin and zosyn started for MRSA and anaerobic coverage. This finding is supported by elevated WBC count of 16.7. Patient also has elevated creatinine at 1.57 which is elevated from her baseline. Patient will receive 1L of fluid. Patient surgical consult placed and spoke with Richard Miu PA-C who agreed to see patient and agree with plan for medicine admission. Also spoke with family medicine teaching service who agreed to see and admit patient..      Final Clinical Impression(s) / ED Diagnoses Final diagnoses:  Abscess of groin, left  Hyperglycemia due to diabetes mellitus [facility])    Rx / DC Orders ED Discharge Orders     None        Azucena Cecil, [place] 10/16/21 1512    Luna Fuse, MD 10/18/21 1315

## 2021-10-16 NOTE — Transfer of Care (Signed)
Immediate Anesthesia Transfer of Care Note  Patient: [name]  Procedure(s) Performed: INCISION AND DRAINAGE ABSCESS (Left: Groin)  Patient Location: PACU  Anesthesia Type:General  Level of Consciousness: drowsy and patient cooperative  Airway & Oxygen Therapy: Patient Spontanous Breathing and Patient connected to nasal cannula oxygen  Post-op Assessment: Report given to RN and Post -op Vital signs reviewed and stable  Post vital signs: Reviewed and stable  Last Vitals:  Vitals Value Taken Time  BP 123/80 10/16/21 1819  Temp    Pulse 111 10/16/21 1821  Resp 19 10/16/21 1821  SpO2 93 % 10/16/21 1821  Vitals shown include unvalidated device data.  Last Pain:  Vitals:   10/16/21 1656  TempSrc: Oral  PainSc:          Complications: No notable events documented.

## 2021-10-17 ENCOUNTER — Encounter (HOSPITAL_COMMUNITY): Payer: Self-pay | Admitting: Surgery

## 2021-10-17 DIAGNOSIS — I129 Hypertensive chronic kidney disease with stage 1 through stage 4 chronic kidney disease, or unspecified chronic kidney disease: Secondary | ICD-10-CM | POA: Diagnosis present

## 2021-10-17 DIAGNOSIS — J9611 Chronic respiratory failure with hypoxia: Secondary | ICD-10-CM | POA: Diagnosis present

## 2021-10-17 DIAGNOSIS — I5032 Chronic diastolic (congestive) heart failure: Secondary | ICD-10-CM

## 2021-10-17 DIAGNOSIS — R34 Anuria and oliguria: Secondary | ICD-10-CM | POA: Diagnosis not present

## 2021-10-17 DIAGNOSIS — E1165 Type 2 diabetes mellitus with hyperglycemia: Secondary | ICD-10-CM | POA: Diagnosis not present

## 2021-10-17 DIAGNOSIS — L02214 Cutaneous abscess of groin: Secondary | ICD-10-CM | POA: Diagnosis present

## 2021-10-17 DIAGNOSIS — E1110 Type 2 diabetes mellitus with ketoacidosis without coma: Secondary | ICD-10-CM | POA: Diagnosis present

## 2021-10-17 DIAGNOSIS — E785 Hyperlipidemia, unspecified: Secondary | ICD-10-CM | POA: Diagnosis present

## 2021-10-17 DIAGNOSIS — J9612 Chronic respiratory failure with hypercapnia: Secondary | ICD-10-CM | POA: Diagnosis present

## 2021-10-17 DIAGNOSIS — J449 Chronic obstructive pulmonary disease, unspecified: Secondary | ICD-10-CM | POA: Diagnosis present

## 2021-10-17 DIAGNOSIS — G4733 Obstructive sleep apnea (adult) (pediatric): Secondary | ICD-10-CM | POA: Diagnosis present

## 2021-10-17 DIAGNOSIS — N1831 Chronic kidney disease, stage 3a: Secondary | ICD-10-CM | POA: Diagnosis present

## 2021-10-17 DIAGNOSIS — E662 Morbid (severe) obesity with alveolar hypoventilation: Secondary | ICD-10-CM | POA: Diagnosis present

## 2021-10-17 DIAGNOSIS — D509 Iron deficiency anemia, unspecified: Secondary | ICD-10-CM | POA: Diagnosis present

## 2021-10-17 DIAGNOSIS — Z6841 Body Mass Index (BMI) 40.0 and over, adult: Secondary | ICD-10-CM | POA: Diagnosis not present

## 2021-10-17 DIAGNOSIS — Z83438 Family history of other disorder of lipoprotein metabolism and other lipidemia: Secondary | ICD-10-CM | POA: Diagnosis not present

## 2021-10-17 DIAGNOSIS — I5030 Unspecified diastolic (congestive) heart failure: Secondary | ICD-10-CM | POA: Diagnosis not present

## 2021-10-17 DIAGNOSIS — K219 Gastro-esophageal reflux disease without esophagitis: Secondary | ICD-10-CM | POA: Diagnosis present

## 2021-10-17 DIAGNOSIS — N170 Acute kidney failure with tubular necrosis: Secondary | ICD-10-CM | POA: Diagnosis not present

## 2021-10-17 DIAGNOSIS — E1142 Type 2 diabetes mellitus with diabetic polyneuropathy: Secondary | ICD-10-CM | POA: Diagnosis present

## 2021-10-17 DIAGNOSIS — G2581 Restless legs syndrome: Secondary | ICD-10-CM | POA: Diagnosis present

## 2021-10-17 DIAGNOSIS — N179 Acute kidney failure, unspecified: Secondary | ICD-10-CM | POA: Diagnosis not present

## 2021-10-17 DIAGNOSIS — G8929 Other chronic pain: Secondary | ICD-10-CM | POA: Diagnosis present

## 2021-10-17 DIAGNOSIS — T508X5A Adverse effect of diagnostic agents, initial encounter: Secondary | ICD-10-CM | POA: Diagnosis not present

## 2021-10-17 DIAGNOSIS — Z20822 Contact with and (suspected) exposure to covid-19: Secondary | ICD-10-CM | POA: Diagnosis present

## 2021-10-17 DIAGNOSIS — L0291 Cutaneous abscess, unspecified: Secondary | ICD-10-CM | POA: Diagnosis not present

## 2021-10-17 DIAGNOSIS — Z8616 Personal history of COVID-19: Secondary | ICD-10-CM | POA: Diagnosis not present

## 2021-10-17 DIAGNOSIS — E1122 Type 2 diabetes mellitus with diabetic chronic kidney disease: Secondary | ICD-10-CM | POA: Diagnosis present

## 2021-10-17 DIAGNOSIS — Z89512 Acquired absence of left leg below knee: Secondary | ICD-10-CM | POA: Diagnosis not present

## 2021-10-17 LAB — GLUCOSE, CAPILLARY
Glucose-Capillary: 282 mg/dL — ABNORMAL HIGH (ref 70–99)
Glucose-Capillary: 312 mg/dL — ABNORMAL HIGH (ref 70–99)
Glucose-Capillary: 379 mg/dL — ABNORMAL HIGH (ref 70–99)
Glucose-Capillary: 415 mg/dL — ABNORMAL HIGH (ref 70–99)
Glucose-Capillary: 515 mg/dL (ref 70–99)
Glucose-Capillary: 517 mg/dL (ref 70–99)
Glucose-Capillary: 535 mg/dL (ref 70–99)
Glucose-Capillary: 542 mg/dL (ref 70–99)
Glucose-Capillary: 543 mg/dL (ref 70–99)
Glucose-Capillary: 555 mg/dL (ref 70–99)
Glucose-Capillary: >600 mg/dL (ref 70–99)
Glucose-Capillary: >600 mg/dL (ref 70–99)

## 2021-10-17 LAB — BASIC METABOLIC PANEL
Anion gap: 14 (ref 5–15)
Anion gap: 16 — ABNORMAL HIGH (ref 5–15)
Anion gap: 14 (ref 5–15)
BUN: 28 mg/dL — ABNORMAL HIGH (ref 6–20)
BUN: 35 mg/dL — ABNORMAL HIGH (ref 6–20)
BUN: 36 mg/dL — ABNORMAL HIGH (ref 6–20)
CO2: 20 mmol/L — ABNORMAL LOW (ref 22–32)
CO2: 18 mmol/L — ABNORMAL LOW (ref 22–32)
CO2: 19 mmol/L — ABNORMAL LOW (ref 22–32)
Calcium: 7.5 mg/dL — ABNORMAL LOW (ref 8.9–10.3)
Calcium: 7.4 mg/dL — ABNORMAL LOW (ref 8.9–10.3)
Calcium: 7.3 mg/dL — ABNORMAL LOW (ref 8.9–10.3)
Chloride: 89 mmol/L — ABNORMAL LOW (ref 98–111)
Chloride: 88 mmol/L — ABNORMAL LOW (ref 98–111)
Chloride: 90 mmol/L — ABNORMAL LOW (ref 98–111)
Creatinine, Ser: 2.74 mg/dL — ABNORMAL HIGH (ref 0.44–1.00)
Creatinine, Ser: 3.78 mg/dL — ABNORMAL HIGH (ref 0.44–1.00)
Creatinine, Ser: 3.86 mg/dL — ABNORMAL HIGH (ref 0.44–1.00)
GFR, Estimated: 21 mL/min — ABNORMAL LOW (ref >60)
GFR, Estimated: 15 mL/min — ABNORMAL LOW (ref >60)
GFR, Estimated: 14 mL/min — ABNORMAL LOW (ref >60)
Glucose, Bld: 396 mg/dL — ABNORMAL HIGH (ref 70–99)
Glucose, Bld: 651 mg/dL (ref 70–99)
Glucose, Bld: 608 mg/dL (ref 70–99)
Potassium: 3.9 mmol/L (ref 3.5–5.1)
Potassium: 4 mmol/L (ref 3.5–5.1)
Potassium: 4.6 mmol/L (ref 3.5–5.1)
Sodium: 123 mmol/L — ABNORMAL LOW (ref 135–145)
Sodium: 122 mmol/L — ABNORMAL LOW (ref 135–145)
Sodium: 123 mmol/L — ABNORMAL LOW (ref 135–145)

## 2021-10-17 LAB — URINALYSIS, ROUTINE W REFLEX MICROSCOPIC
Bilirubin Urine: NEGATIVE
Glucose, UA: >=500 mg/dL — AB
Ketones, ur: 5 mg/dL — AB
Nitrite: NEGATIVE
Protein, ur: 100 mg/dL — AB
RBC / HPF: >50 RBC/hpf — ABNORMAL HIGH (ref 0–5)
Specific Gravity, Urine: 1.014 (ref 1.005–1.030)
WBC, UA: >50 WBC/hpf — ABNORMAL HIGH (ref 0–5)
pH: 6 (ref 5.0–8.0)

## 2021-10-17 LAB — CBC
HCT: 32.9 % — ABNORMAL LOW (ref 36.0–46.0)
Hemoglobin: 10.8 g/dL — ABNORMAL LOW (ref 12.0–15.0)
MCH: 30.8 pg (ref 26.0–34.0)
MCHC: 32.8 g/dL (ref 30.0–36.0)
MCV: 93.7 fL (ref 80.0–100.0)
Platelets: 322 10*3/uL (ref 150–400)
RBC: 3.51 MIL/uL — ABNORMAL LOW (ref 3.87–5.11)
RDW: 14.1 % (ref 11.5–15.5)
WBC: 16.9 10*3/uL — ABNORMAL HIGH (ref 4.0–10.5)
nRBC: 0 % (ref 0.0–0.2)

## 2021-10-17 LAB — BETA-HYDROXYBUTYRIC ACID: Beta-Hydroxybutyric Acid: 2.54 mmol/L — ABNORMAL HIGH (ref 0.05–0.27)

## 2021-10-17 MED ORDER — ONDANSETRON 4 MG PO TBDP
4.0000 mg | ORAL_TABLET | Freq: Three times a day (TID) | ORAL | Status: DC | PRN
  Administered 2021-10-20: 4 mg via ORAL
  Filled 2021-10-17 (×2): qty 1

## 2021-10-17 MED ORDER — INSULIN ASPART 100 UNIT/ML IJ SOLN
15.0000 [IU] | Freq: Once | INTRAMUSCULAR | Status: AC
  Administered 2021-10-17: 15 [IU] via SUBCUTANEOUS

## 2021-10-17 MED ORDER — ONDANSETRON HCL 4 MG/2ML IJ SOLN
4.0000 mg | Freq: Three times a day (TID) | INTRAMUSCULAR | Status: DC | PRN
  Administered 2021-10-17 – 2021-10-24 (×8): 4 mg via INTRAVENOUS
  Filled 2021-10-17 (×7): qty 2

## 2021-10-17 MED ORDER — INSULIN GLARGINE-YFGN 100 UNIT/ML ~~LOC~~ SOLN
75.0000 [IU] | Freq: Two times a day (BID) | SUBCUTANEOUS | Status: DC
Start: 2021-10-17 — End: 2021-10-18
  Filled 2021-10-17 (×3): qty 0.75

## 2021-10-17 MED ORDER — SODIUM CHLORIDE 0.9 % IV BOLUS
1000.0000 mL | Freq: Once | INTRAVENOUS | Status: DC
  Administered 2021-10-18: 1000 mL via INTRAVENOUS

## 2021-10-17 MED ORDER — ONDANSETRON HCL 4 MG PO TABS
4.0000 mg | ORAL_TABLET | Freq: Three times a day (TID) | ORAL | Status: DC | PRN
  Administered 2021-10-19: 4 mg via ORAL
  Filled 2021-10-17: qty 1

## 2021-10-17 MED ORDER — OXYCODONE-ACETAMINOPHEN 7.5-325 MG PO TABS
1.0000 | ORAL_TABLET | ORAL | Status: DC | PRN
  Administered 2021-10-18 – 2021-10-20 (×5): 1 via ORAL
  Filled 2021-10-17 (×5): qty 1

## 2021-10-17 MED ORDER — DEXTROSE 50 % IV SOLN: 0.0000 mL | INTRAVENOUS | Status: DC | PRN

## 2021-10-17 MED ORDER — ENOXAPARIN SODIUM 40 MG/0.4ML IJ SOSY
40.0000 mg | PREFILLED_SYRINGE | INTRAMUSCULAR | Status: DC
  Administered 2021-10-18: 40 mg via SUBCUTANEOUS
  Filled 2021-10-17: qty 0.4

## 2021-10-17 MED ORDER — SODIUM CHLORIDE 0.9 % IV BOLUS
1000.0000 mL | Freq: Once | INTRAVENOUS | Status: AC
  Administered 2021-10-17: 1000 mL via INTRAVENOUS

## 2021-10-17 MED ORDER — GABAPENTIN 400 MG PO CAPS
1000.0000 mg | ORAL_CAPSULE | Freq: Three times a day (TID) | ORAL | Status: DC
  Administered 2021-10-17 – 2021-10-18 (×2): 1000 mg via ORAL
  Filled 2021-10-17 (×2): qty 1

## 2021-10-17 MED ORDER — NEOMYCIN-POLYMYXIN-HC 3.5-10000-1 OT SUSP: 3.0000 [drp] | Freq: Four times a day (QID) | OTIC | Status: DC

## 2021-10-17 MED ORDER — DEXTROSE 50 % IV SOLN: 1.0000 | INTRAVENOUS | Status: DC | PRN

## 2021-10-17 MED ORDER — DEXTROSE IN LACTATED RINGERS 5 % IV SOLN: INTRAVENOUS | Status: DC

## 2021-10-17 MED ORDER — INSULIN REGULAR(HUMAN) IN NACL 100-0.9 UT/100ML-% IV SOLN
INTRAVENOUS | Status: DC
  Administered 2021-10-17: 11.5 [IU]/h via INTRAVENOUS
  Administered 2021-10-18: 13 [IU]/h via INTRAVENOUS
  Filled 2021-10-17 (×3): qty 100

## 2021-10-17 MED ORDER — HYDROMORPHONE HCL 1 MG/ML IJ SOLN
0.5000 mg | INTRAMUSCULAR | Status: DC | PRN
  Administered 2021-10-17: 0.5 mg via INTRAVENOUS
  Filled 2021-10-17: qty 1

## 2021-10-17 MED ORDER — INSULIN ASPART 100 UNIT/ML IJ SOLN: 20.0000 [IU] | Freq: Once | INTRAMUSCULAR | Status: DC

## 2021-10-17 MED ORDER — INSULIN GLARGINE-YFGN 100 UNIT/ML ~~LOC~~ SOLN
50.0000 [IU] | Freq: Two times a day (BID) | SUBCUTANEOUS | Status: DC
  Administered 2021-10-17: 50 [IU] via SUBCUTANEOUS
  Filled 2021-10-17 (×2): qty 0.5

## 2021-10-17 MED ORDER — DOXYCYCLINE HYCLATE 100 MG IV SOLR
100.0000 mg | Freq: Two times a day (BID) | INTRAVENOUS | Status: DC
  Administered 2021-10-17 – 2021-10-21 (×8): 100 mg via INTRAVENOUS
  Filled 2021-10-17 (×9): qty 100

## 2021-10-17 MED ORDER — MORPHINE SULFATE (PF) 4 MG/ML IV SOLN
4.0000 mg | INTRAVENOUS | Status: DC | PRN
  Administered 2021-10-17: 4 mg via INTRAVENOUS
  Filled 2021-10-17: qty 1

## 2021-10-17 MED ORDER — INSULIN ASPART 100 UNIT/ML IJ SOLN: 0.0000 [IU] | Freq: Three times a day (TID) | INTRAMUSCULAR | Status: DC

## 2021-10-17 MED ORDER — OXYCODONE HCL 5 MG PO TABS
2.5000 mg | ORAL_TABLET | ORAL | Status: DC | PRN
  Administered 2021-10-18: 2.5 mg via ORAL
  Filled 2021-10-17: qty 1

## 2021-10-17 MED ORDER — ONDANSETRON HCL 4 MG/2ML IJ SOLN
INTRAMUSCULAR | Status: AC
  Administered 2021-10-17: 4 mg
  Filled 2021-10-17: qty 2

## 2021-10-17 MED ORDER — INSULIN GLARGINE-YFGN 100 UNIT/ML ~~LOC~~ SOLN
25.0000 [IU] | Freq: Once | SUBCUTANEOUS | Status: AC
  Administered 2021-10-17: 25 [IU] via SUBCUTANEOUS
  Filled 2021-10-17: qty 0.25

## 2021-10-17 MED ORDER — LACTATED RINGERS IV SOLN: INTRAVENOUS | Status: DC

## 2021-10-17 MED ORDER — GABAPENTIN 800 MG PO TABS
1000.0000 mg | ORAL_TABLET | Freq: Three times a day (TID) | ORAL | Status: DC
  Filled 2021-10-17: qty 0.5

## 2021-10-17 MED ORDER — CIPROFLOXACIN-DEXAMETHASONE 0.3-0.1 % OT SUSP
4.0000 [drp] | Freq: Two times a day (BID) | OTIC | Status: DC
  Administered 2021-10-17 – 2021-10-18 (×3): 4 [drp] via OTIC
  Filled 2021-10-17: qty 7.5

## 2021-10-17 MED ORDER — ENOXAPARIN SODIUM 80 MG/0.8ML IJ SOSY: 80.0000 mg | PREFILLED_SYRINGE | INTRAMUSCULAR | Status: DC

## 2021-10-17 MED ORDER — INSULIN ASPART 100 UNIT/ML IJ SOLN: 0.0000 [IU] | Freq: Every day | INTRAMUSCULAR | Status: DC

## 2021-10-17 MED ORDER — CHLORHEXIDINE GLUCONATE CLOTH 2 % EX PADS
6.0000 | MEDICATED_PAD | Freq: Every day | CUTANEOUS | Status: DC
  Administered 2021-10-18 – 2021-10-25 (×8): 6 via TOPICAL

## 2021-10-17 MED ORDER — INSULIN ASPART 100 UNIT/ML IJ SOLN
6.0000 [IU] | Freq: Three times a day (TID) | INTRAMUSCULAR | Status: DC
  Administered 2021-10-18 – 2021-10-25 (×20): 6 [IU] via SUBCUTANEOUS

## 2021-10-17 NOTE — Progress Notes (Addendum)
FPTS Brief Progress Note  S: Patient evaluated with nurse at bedside.  Patient reports she is currently feeling the urge to urinate although she has Foley catheter in place.  Bladder scan completed while present showing 0 mL in her bladder.  Drainage from Foley catheter is thick and white.  Will flush Foley with saline to ensure it is draining properly.   O: BP 116/65 (BP Location: Right Arm)   Pulse 86   Temp 98.2 F (36.8 C) (Oral)   Resp 12   Ht 5\' 2"  (1.575 m)   Wt (!) 154.2 kg   SpO2 99%   BMI 62.18 kg/m   General: Pleasant 45 year old female lying in bed Cardiac: Regular rate  Respiratory: Speaking in full sentences  A/P: DKA Patient is currently on Endo tool.  CBGs are in the 100s so plan on transition to long-acting insulin when able.  Anion gap is closed.  Creatinine is worsening at 4.11.  We will continue to hydrate with IV fluids.  Repeat BMP every 6 hours and consider fluid bolus if next creatinine is worsening.  AKI Patient s/p fluid bolus with fluids currently running.  Continuing BMP every 6 hours at this time due to DKA.  Creatinine increased to 4.11.  Foley catheter has been placed with pyuria outputs.  Urine culture sent.  On this evaluation the patient is still feeling fullness.  Recommended flushing the Foley catheter to ensure patency and if no return recommended removal and replacement of Foley catheter.  We will continue to monitor BMP as well as urine output closely.  Spoke with radiology regarding decreased urine output and worsening creatinine.  Considered repeat CT but have opted for renal ultrasound first.  Stat renal ultrasound ordered.  Consider nephrology consult if continues to worsen.  Consider FeNa if continues to worsen.  - Orders reviewed. Labs for AM not ordered, which was adjusted as needed.    Gifford Shave, MD 10/17/2021, 11:07 PM PGY-3, [place] Night Resident  Please page [phone] with questions.

## 2021-10-17 NOTE — Progress Notes (Signed)
Progress Note  1 Day Post-Op  Subjective: Patient reports severe nausea. She has not been able to eat well for a few days. Some pain in L groin but not severe. Patient reports she takes 7.5-325 percocet TID at home for chronic pain.   Objective: Vital signs in last 24 hours: Temp:  [97.3 F (36.3 C)-99 F (37.2 C)] 97.6 F (36.4 C) (11/12 0332) Pulse Rate:  [84-111] 96 (11/11 2224) Resp:  [13-22] 17 (11/11 2224) BP: (100-138)/(50-80) 124/52 (11/12 0332) SpO2:  [90 %-99 %] 94 % (11/12 0332) Weight:  [154.2 kg] 154.2 kg (11/11 2005) Last BM Date: 10/15/21  Intake/Output from previous day: 11/11 0701 - 11/12 0700 In: 980 [P.O.:480; I.V.:500] Out: 150 [Urine:150] Intake/Output this shift: No intake/output data recorded.  PE: GU: L groin packing removed without complication, packing with bloody purulent drainage, malodorous, surrounding cellulitis present, penrose in place   Lab Results:  Recent Labs    10/16/21 2029 10/17/21 0355  WBC 17.8* 16.9*  HGB 11.0* 10.8*  HCT 32.9* 32.9*  PLT 315 322   BMET Recent Labs    10/16/21 0950 10/16/21 1145 10/16/21 2029 10/17/21 0355  NA 125* 127*  --  123*  K 3.4* 3.5  --  3.9  CL 88*  --   --  89*  CO2 25  --   --  20*  GLUCOSE 474*  --   --  396*  BUN 23*  --   --  28*  CREATININE 1.52*  --  1.95* 2.74*  CALCIUM 7.9*  --   --  7.5*   PT/INR No results for input(s): LABPROT, INR in the last 72 hours. CMP     Component Value Date/Time   NA 123 (L) 10/17/2021 0355   NA 139 04/24/2021 1119   K 3.9 10/17/2021 0355   CL 89 (L) 10/17/2021 0355   CO2 20 (L) 10/17/2021 0355   GLUCOSE 396 (H) 10/17/2021 0355   BUN 28 (H) 10/17/2021 0355   BUN 49 (H) 04/24/2021 1119   CREATININE 2.74 (H) 10/17/2021 0355   CREATININE 0.51 08/05/2015 1114   CALCIUM 7.5 (L) 10/17/2021 0355   PROT 7.0 04/27/2021 0632   ALBUMIN 2.7 (L) 04/27/2021 0632   AST 15 04/27/2021 0632   ALT 23 04/27/2021 0632   ALKPHOS 100 04/27/2021 0632    BILITOT 0.7 04/27/2021 0632   GFRNONAA 21 (L) 10/17/2021 0355   GFRAA 77 12/09/2020 1605   Lipase     Component Value Date/Time   LIPASE 56 (H) 11/01/2020 1720       Studies/Results: CT PELVIS W CONTRAST  Result Date: 10/16/2021 CLINICAL DATA:  Abdominal abscess/infection suspected. EXAM: CT PELVIS WITH CONTRAST TECHNIQUE: Multidetector CT imaging of the pelvis was performed using the standard protocol following the bolus administration of intravenous contrast. CONTRAST:  120mL OMNIPAQUE IOHEXOL 350 MG/ML SOLN COMPARISON:  CT examination dated April 03, 2021 FINDINGS: Urinary Tract:  No abnormality visualized. Bowel:  Unremarkable visualized pelvic bowel loops. Vascular/Lymphatic: No pathologically enlarged lymph nodes. No significant vascular abnormality seen. Reproductive:  No mass or other significant abnormality Skin/soft tissues: There is a peripherally enhancing soft tissue collection with gas in the subcutaneous soft tissues of the left groin measuring at least 4.0 x 11.0 x 12.4 cm, consistent with abscess. Musculoskeletal: No suspicious bone lesions identified. IMPRESSION: Left groin soft tissue collection with gas measuring at least 4.0 x 11.0 x 12.4 cm consistent with abscess. Surgical consultation for further management is recommended. Electronically Signed  By: Keane Police D.O.   On: 10/16/2021 14:24   DG Chest Portable 1 View  Result Date: 10/16/2021 CLINICAL DATA:  Chest pain. EXAM: PORTABLE CHEST 1 VIEW COMPARISON:  May 05, 2021. FINDINGS: The heart size and mediastinal contours are within normal limits. Both lungs are clear. The visualized skeletal structures are unremarkable. IMPRESSION: No active disease. Electronically Signed   By: Marijo Conception M.D.   On: 10/16/2021 09:47    Anti-infectives: Anti-infectives (From admission, onward)    Start     Dose/Rate Route Frequency Ordered Stop   10/16/21 2230  cefTRIAXone (ROCEPHIN) 2 g in sodium chloride 0.9 % 100 mL IVPB         2 g 200 mL/hr over 30 Minutes Intravenous Every 24 hours 10/16/21 2143     10/16/21 2230  metroNIDAZOLE (FLAGYL) IVPB 500 mg        500 mg 100 mL/hr over 60 Minutes Intravenous Every 12 hours 10/16/21 2143     10/16/21 1345  piperacillin-tazobactam (ZOSYN) IVPB 3.375 g        3.375 g 100 mL/hr over 30 Minutes Intravenous  Once 10/16/21 1341 10/16/21 1534   10/16/21 1215  vancomycin (VANCOCIN) 2,500 mg in sodium chloride 0.9 % 500 mL IVPB        2,500 mg 250 mL/hr over 120 Minutes Intravenous  Once 10/16/21 1201 10/16/21 1438        Assessment/Plan L groin abscess POD s/p I&D - packing removed at bedside today  - cxs pending, WBC 16.9, afebrile - penrose in place - place ABD pad in L groin crease q4-6hrs or change more often if saturated  - no indication for return to OR at this time, will reassess tomorrow - have placed patient back on 7.5-325 percocet q4h prn for pain control with 2.5 mg oxycodone q4h prn for more severe pain, IV dilaudid 0.5 mg q4h prn for breakthrough pain      FEN:  CM diet, IVF @150cc /h per primary team  BH:ALPFX/TKWI; rocephin/flagyl>> VTE: LMWH   T2DM w/ DKA COPD - wears 3L O2 nightly Morbidly obese Hx of LLE amputation  Chronic pain 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

## 2021-10-17 NOTE — Progress Notes (Signed)
Family Medicine Teaching Service Daily Progress Note Intern Pager: [phone]  Patient name: [name] Medical record number: [id number] Date of birth: Nov 16, 1976 Age: 45 y.o. Gender: female  Primary Care Provider: [name], MD Consultants: General surgery Code Status: Full  Pt Overview and Major Events to Date:  11/11 admission, OR for I&D of left inguinal abscess  Assessment and Plan: Rhilyn Hall is a 45 y.o. year old female admitted for surgical and medical management of left groin abscess.  PMH includes T2DM, OHS, OSA with BiPAP, HTN, asthma, HLD, GERD, RLS, left BKA, right transmetatarsal amputation, history of chest pain.  Left groin abscess Postop day #1 s/p I&D.  WBC stable.  Afebrile overnight. Surgery remove packing today.  Dressing changes and pain medications per surgery guidance.  S/p Vanco and Zosyn preop 11/11.  Started on Rocephin and Flagyl overnight. - Vital signs per unit routine - Follow-up surgical cultures - Daily CBC - PT/OT eval and treat   Hyperglycemia  DM2, Poorly Controlled Morning glucose 543.  Admission A1c >15.  Mild anion gap of 14.  ABG on admission yesterday 7.4, no evidence of acidosis.  On admission, patient ordered Semglee 50 units twice daily and TID AC correction.  - Increase sliding scale to resistant - Increase Semglee to 75 units twice daily  - Diabetes coordinator - BHB add-on to AM labs   Chest pain No complaints of chest pain overnight.  Troponins negative and flat.  We will continue to monitor.  If has repeat chest pain, will perform repeat EKG.   Diarrhea  Vomiting No diarrhea overnight.  Patient continues to be quite nauseous and vomiting.  Has received Zofran this morning.  May consider alternative antiemetic such as Compazine or Phenergan.  QTC on admission 469.   AKI, likely pre-renal S/p 2 L normal saline bolus on admission with subsequent maintenance fluids at 150 mL/h.  Creatinine bumped this morning from 1.95 to  2.74.  Likely due to preoperative Vanco and Zosyn yesterday. - Additional 1 L normal saline bolus today - Continue pain is fluids with normal saline at 150 mL/h - 3 PM BMP - Consider labs for FeNa if 3 PM BMP not improved - A.m. BMP   Electrolyte derangements: Na,K,Cl Sodium stable 125 > 127 > 123.  Potassium improved 3.5 > 3.9.  Chloride stable at 89.  Most likely due to fluid losses from diarrhea and vomiting. - Continue normal saline fluids - Daily BMP  Otitis externa Evident on physical exam today. -Ciprodex 3 drops right ear   Asthma  OSA  OHS  Continue home Dulera and Incruse Ellipta.  BiPAP 3 L nightly.  Iron deficiency anemia Hemoglobin stable from admission.  Continue home iron supplementation.  Monitor daily CBC.     FEN/GI: Carb modified PPx: Lovenox Dispo:Home pending clinical improvement . Barriers include continued nausea, need for IV antibiotics.   Subjective:  Patient sitting at bedside with fan in front of her face.  Had just experienced round of emesis.  Zofran providing mild relief.  Objective: Temp:  [97.3 F (36.3 C)-99 F (37.2 C)] 97.6 F (36.4 C) (11/12 1007) Pulse Rate:  [84-111] 96 (11/11 2224) Resp:  [13-20] 14 (11/12 1007) BP: (101-138)/(50-80) 110/54 (11/12 1007) SpO2:  [90 %-99 %] 94 % (11/12 1007) Weight:  [154.2 kg] 154.2 kg (11/11 2005) Physical Exam: General: Awake, alert, mild distress, face flushed Cardiovascular: Regular rate and rhythm no murmurs appreciated Respiratory: CTA B  Laboratory: Recent Labs  Lab 10/16/21 0950 10/16/21  1145 10/16/21 2029 10/17/21 0355  WBC 16.7*  --  17.8* 16.9*  HGB 11.3* 12.2 11.0* 10.8*  HCT 34.4* 36.0 32.9* 32.9*  PLT 338  --  315 322   Recent Labs  Lab 10/16/21 0950 10/16/21 1145 10/16/21 2029 10/17/21 0355  NA 125* 127*  --  123*  K 3.4* 3.5  --  3.9  CL 88*  --   --  89*  CO2 25  --   --  20*  BUN 23*  --   --  28*  CREATININE 1.52*  --  1.95* 2.74*  CALCIUM 7.9*  --   --   7.5*  GLUCOSE 474*  --   --  396*    Imaging/Diagnostic Tests: PORTABLE CHEST 1 VIEW 11/11  COMPARISON:  May 05, 2021 FINDINGS: The heart size and mediastinal contours are within normal limits. Both lungs are clear. The visualized skeletal structures are unremarkable. IMPRESSION: No active disease.  CLINICAL DATA:  Abdominal abscess/infection suspected. CT PELVIS WITH CONTRAST 11/11  IMPRESSION: Left groin soft tissue collection with gas measuring at least 4.0 x 11.0 x 12.4 cm consistent with abscess. Surgical consultation for further management is recommended.   Ezequiel Essex, MD 10/17/2021, 11:26 AM PGY-2, [place] Intern pager: [phone], text pages welcome

## 2021-10-17 NOTE — Progress Notes (Addendum)
Pt given 75 units long-acting insulin morning and 15units short acting insulin.  CBGs have continued to rise.  Got a stat BMP that showed CBG 651, slightly acidotic with an anion gap of 16 (upper endo of normal).  BHB ~ 2.5. K 4.0.  We will start Endo tool hyperglycemic protocol.  Patient to be NPO.  Patient received 1 L of fluids around 5AM.  Limit IVF for now as she has concomitant heart failure.  ?mild DKA  Holding home Torsemide in the setting of likely AKI; Cr 3.78.   Transfer to progressive.  Lyndee Hensen, DO

## 2021-10-17 NOTE — Hospital Course (Addendum)
[name] is a 45 year-old female who presented with left groin abscess.  PMH significant for T2DM, morbid obesity, obesity hypoventilation syndrome, OSA wears BiPAP at night, hypertension, asthma, GERD, hyperlipidemia, restless leg syndrome, left BKA, mood disorder, hypertension, history of chest pain with low risk for cardiac etiology.  Hospital course is outlined below:  Left groin abscess, s/p I&D Patient has history of multiple recurrent groin abscesses, with I&D required in the past.  Upon arrival to the ED, she was afebrile with leukocytosis to 16.7, otherwise unremarkable labs.  Vital signs were within normal limits and she was saturating well on room air.  CT pelvis showed left groin soft tissue collection with gas measuring at least 4.0 x 11.0 x 12.4 cm consistent with abscess.  General surgery was consulted for incision and drainage. IV vancomycin and Zosyn x1 received in ED. Throughout abx course, received ceftriaxone, flagyl, IV doxycycline. Transitioned to Augmentin and oral doxycycline. Pain was managed with oxycodone and acetaminophen. Pt was discharged with *** to complete 14-day course of total abx therapy, per surgery recommendations.   Acute renal failure in setting of CKD stage IIIa Baseline creatinine 1.17 from 6 months prior, 1.52 on admission.  Patient had continual rise in creatinine up to 5.62 with GFR 9, BUN 48.  Patient not uremic or indicating urgent need for dialysis.  She was anuric. Nephrology was consulted and initiated on high-dose IV Lasix with good urinary output response. Upon discharge, pt was transitioned back to home dosed Torsemide 80mg  daily.    Poorly controlled type 2 diabetes mellitus, DKA Glucose on admission 474.  Required Endo tool.  Anion gap closed, transition to long-acting insulin.  CBGs eventually well controlled 70s-130s range.  AMS/Sedation Pt experienced change in baseline response level.  Hypoglycemia, metabolic disturbance, stroke/seizure  ruled out unlikely.  CT head negative.  Patient presenting with flapping of arms and active motion of arms above head but endorsed the symptoms prior to hospitalization.  Sedative medications of oxycodone and gabapentin discontinued at that time as suspicious of gabapentin sedation in the setting of renal dysfunction. Upon discharge, patient was alert and oriented.  Discharge recommendations: T2DM: PCP may consider Mounjaro for T2DM with high insulin requirement and would benefit from weight loss.  T2DM: Consider connecting with clinical pharmacists for additional diabetes management.  Recheck serum creatine at follow up  Titrate Gabapentin as needed  continue leaving Penrose drain in place which will be removed at a clinic follow-up, per surgery recommendations. *** Continue oral doxycycline and oral Augmentin for total antibiotic treatment of 14 days, per surgery recommendations

## 2021-10-17 NOTE — Progress Notes (Signed)
FPTS Brief Progress Note  S:Patient sleeping comfortably in bed. Nurse reports that patient has wound on right food, in need of wound care consult. 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    O: BP (!) 126/50 (BP Location: Left Arm)   Pulse 96   Temp 98.9 F (37.2 C) (Oral)   Resp 17   Ht 5\' 2"  (1.575 m)   Wt (!) 154.2 kg   SpO2 99%   BMI 62.18 kg/m     A/P: MASD -nystatin cream  Rt Foot Wound -Wound care consult  Left groin abscess Continue plans per day team  - Orders reviewed. Labs for AM ordered, which was adjusted as needed.    Holley Bouche, MD 10/17/2021, 2:39 AM PGY-1, [facility] Night Resident  Please page [phone] with questions.

## 2021-10-17 NOTE — Consult Note (Signed)
[facility] Nurse Consult Note: Reason for Consult:Chronic, nonhealing full thickness wound to right foot.  Previously seen by Dr. Laurene Footman in the outpatient wound are center for this wound. Last seen in June 2022. Wound type: Neuropathic Pressure Injury POA: N/A Measurement:1.9cm x 3cm x 0.3cm Wound bed: red, dry Drainage (amount, consistency, odor): dried serum obscures partial wound bed  Periwound: intact Dressing procedure/placement/frequency:I will provide Nursing with conservative care orders using an antimicrobial nonadherent as a wound contact layer and topping with a dry gauze, followed by securing with a few turns of Kerlix roll gauze/paper tape. Post discharge, I recommend referral back to Dr. Dellia Nims at the outpatient wound care center for oversight and follow up of this patient, including the provision of orthotics for pressure redistribution and offloading. This admission she has other wounds of infectious etiology that are being addressed by surgery.  [facility] nursing team will not follow, but will remain available to this patient, the nursing and medical teams.  Please re-consult if needed. Thanks, Maudie Flakes, MSN, RN, [place], Arther Abbott  Pager# [phone]

## 2021-10-17 NOTE — Progress Notes (Signed)
PT Cancellation Note  Patient Details Name: [name] MRN: [id number] DOB: July 20, 1976   Cancelled Treatment:    Reason Eval/Treat Not Completed: Medical issues which prohibited therapy. Pt with symptomatic hyperglycemia at this time. RN requesting hold on PT eval at this time. Will plan to follow-up tomorrow as able.   Moishe Spice, PT, DPT Acute Rehabilitation Services  Pager: [phone] Office: [place] 10/17/2021, 4:05 PM

## 2021-10-17 NOTE — Plan of Care (Signed)
This MD met with patient to discuss with her the changes that were being made regarding her current treatment for DKA. All of her questions were answered and concerns addressed. Patient had no further questions and expressed understanding of her regimen: Insulin drip and NS, changes to long-and short-acting insulin, holding Torsemide for AKI, and NPO until glucose begins to downtrend.  Rosezetta Schlatter, MD PGY-1 10/17/2021

## 2021-10-17 NOTE — Progress Notes (Signed)
Pt states she will call when ready for CPAP. States that she did not sleep well on it last night and has felt nauseous today.

## 2021-10-18 ENCOUNTER — Inpatient Hospital Stay (HOSPITAL_COMMUNITY): Payer: Medicaid Other

## 2021-10-18 DIAGNOSIS — G4733 Obstructive sleep apnea (adult) (pediatric): Secondary | ICD-10-CM | POA: Diagnosis not present

## 2021-10-18 DIAGNOSIS — N179 Acute kidney failure, unspecified: Secondary | ICD-10-CM | POA: Diagnosis not present

## 2021-10-18 DIAGNOSIS — L02214 Cutaneous abscess of groin: Secondary | ICD-10-CM | POA: Diagnosis not present

## 2021-10-18 DIAGNOSIS — E1165 Type 2 diabetes mellitus with hyperglycemia: Secondary | ICD-10-CM | POA: Diagnosis not present

## 2021-10-18 LAB — GLUCOSE, CAPILLARY
Glucose-Capillary: 153 mg/dL — ABNORMAL HIGH (ref 70–99)
Glucose-Capillary: 188 mg/dL — ABNORMAL HIGH (ref 70–99)
Glucose-Capillary: 192 mg/dL — ABNORMAL HIGH (ref 70–99)
Glucose-Capillary: 192 mg/dL — ABNORMAL HIGH (ref 70–99)
Glucose-Capillary: 218 mg/dL — ABNORMAL HIGH (ref 70–99)
Glucose-Capillary: 221 mg/dL — ABNORMAL HIGH (ref 70–99)
Glucose-Capillary: 261 mg/dL — ABNORMAL HIGH (ref 70–99)
Glucose-Capillary: 269 mg/dL — ABNORMAL HIGH (ref 70–99)
Glucose-Capillary: 272 mg/dL — ABNORMAL HIGH (ref 70–99)
Glucose-Capillary: 273 mg/dL — ABNORMAL HIGH (ref 70–99)

## 2021-10-18 LAB — BASIC METABOLIC PANEL
Anion gap: 13 (ref 5–15)
Anion gap: 12 (ref 5–15)
Anion gap: 16 — ABNORMAL HIGH (ref 5–15)
BUN: 37 mg/dL — ABNORMAL HIGH (ref 6–20)
BUN: 38 mg/dL — ABNORMAL HIGH (ref 6–20)
BUN: 38 mg/dL — ABNORMAL HIGH (ref 6–20)
CO2: 20 mmol/L — ABNORMAL LOW (ref 22–32)
CO2: 21 mmol/L — ABNORMAL LOW (ref 22–32)
CO2: 19 mmol/L — ABNORMAL LOW (ref 22–32)
Calcium: 7.4 mg/dL — ABNORMAL LOW (ref 8.9–10.3)
Calcium: 7.9 mg/dL — ABNORMAL LOW (ref 8.9–10.3)
Calcium: 7.9 mg/dL — ABNORMAL LOW (ref 8.9–10.3)
Chloride: 93 mmol/L — ABNORMAL LOW (ref 98–111)
Chloride: 94 mmol/L — ABNORMAL LOW (ref 98–111)
Chloride: 90 mmol/L — ABNORMAL LOW (ref 98–111)
Creatinine, Ser: 4.11 mg/dL — ABNORMAL HIGH (ref 0.44–1.00)
Creatinine, Ser: 4.62 mg/dL — ABNORMAL HIGH (ref 0.44–1.00)
Creatinine, Ser: 4.55 mg/dL — ABNORMAL HIGH (ref 0.44–1.00)
GFR, Estimated: 13 mL/min — ABNORMAL LOW (ref >60)
GFR, Estimated: 11 mL/min — ABNORMAL LOW (ref >60)
GFR, Estimated: 12 mL/min — ABNORMAL LOW (ref >60)
Glucose, Bld: 228 mg/dL — ABNORMAL HIGH (ref 70–99)
Glucose, Bld: 281 mg/dL — ABNORMAL HIGH (ref 70–99)
Glucose, Bld: 247 mg/dL — ABNORMAL HIGH (ref 70–99)
Potassium: 3.6 mmol/L (ref 3.5–5.1)
Potassium: 4.2 mmol/L (ref 3.5–5.1)
Potassium: 4 mmol/L (ref 3.5–5.1)
Sodium: 126 mmol/L — ABNORMAL LOW (ref 135–145)
Sodium: 127 mmol/L — ABNORMAL LOW (ref 135–145)
Sodium: 125 mmol/L — ABNORMAL LOW (ref 135–145)

## 2021-10-18 LAB — COMPREHENSIVE METABOLIC PANEL
ALT: 12 U/L (ref 0–44)
AST: 13 U/L — ABNORMAL LOW (ref 15–41)
Albumin: 1.9 g/dL — ABNORMAL LOW (ref 3.5–5.0)
Alkaline Phosphatase: 187 U/L — ABNORMAL HIGH (ref 38–126)
Anion gap: 14 (ref 5–15)
BUN: 38 mg/dL — ABNORMAL HIGH (ref 6–20)
CO2: 21 mmol/L — ABNORMAL LOW (ref 22–32)
Calcium: 7.6 mg/dL — ABNORMAL LOW (ref 8.9–10.3)
Chloride: 92 mmol/L — ABNORMAL LOW (ref 98–111)
Creatinine, Ser: 4.5 mg/dL — ABNORMAL HIGH (ref 0.44–1.00)
GFR, Estimated: 12 mL/min — ABNORMAL LOW (ref >60)
Glucose, Bld: 243 mg/dL — ABNORMAL HIGH (ref 70–99)
Potassium: 3.6 mmol/L (ref 3.5–5.1)
Sodium: 127 mmol/L — ABNORMAL LOW (ref 135–145)
Total Bilirubin: 0.4 mg/dL (ref 0.3–1.2)
Total Protein: 7.1 g/dL (ref 6.5–8.1)

## 2021-10-18 LAB — CBC
HCT: 29.1 % — ABNORMAL LOW (ref 36.0–46.0)
Hemoglobin: 9.8 g/dL — ABNORMAL LOW (ref 12.0–15.0)
MCH: 31.4 pg (ref 26.0–34.0)
MCHC: 33.7 g/dL (ref 30.0–36.0)
MCV: 93.3 fL (ref 80.0–100.0)
Platelets: 334 10*3/uL (ref 150–400)
RBC: 3.12 MIL/uL — ABNORMAL LOW (ref 3.87–5.11)
RDW: 14.3 % (ref 11.5–15.5)
WBC: 13.4 10*3/uL — ABNORMAL HIGH (ref 4.0–10.5)
nRBC: 0 % (ref 0.0–0.2)

## 2021-10-18 MED ORDER — INSULIN GLARGINE-YFGN 100 UNIT/ML ~~LOC~~ SOLN
50.0000 [IU] | Freq: Two times a day (BID) | SUBCUTANEOUS | Status: DC
Start: 2021-10-18 — End: 2021-10-18
  Filled 2021-10-18: qty 0.5

## 2021-10-18 MED ORDER — INSULIN ASPART 100 UNIT/ML IJ SOLN
0.0000 [IU] | Freq: Every day | INTRAMUSCULAR | Status: DC
  Administered 2021-10-18: 2 [IU] via SUBCUTANEOUS
  Administered 2021-10-19: 4 [IU] via SUBCUTANEOUS
  Administered 2021-10-20 – 2021-10-24 (×4): 2 [IU] via SUBCUTANEOUS

## 2021-10-18 MED ORDER — INSULIN ASPART 100 UNIT/ML IJ SOLN
0.0000 [IU] | Freq: Three times a day (TID) | INTRAMUSCULAR | Status: DC
  Administered 2021-10-18 (×2): 5 [IU] via SUBCUTANEOUS
  Administered 2021-10-18: 2 [IU] via SUBCUTANEOUS
  Administered 2021-10-19: 9 [IU] via SUBCUTANEOUS
  Administered 2021-10-19: 7 [IU] via SUBCUTANEOUS

## 2021-10-18 MED ORDER — INSULIN GLARGINE-YFGN 100 UNIT/ML ~~LOC~~ SOLN
50.0000 [IU] | Freq: Two times a day (BID) | SUBCUTANEOUS | Status: DC
  Administered 2021-10-18 – 2021-10-20 (×5): 50 [IU] via SUBCUTANEOUS
  Filled 2021-10-18 (×6): qty 0.5

## 2021-10-18 MED ORDER — GABAPENTIN 400 MG PO CAPS
1000.0000 mg | ORAL_CAPSULE | Freq: Two times a day (BID) | ORAL | Status: DC
  Administered 2021-10-18 – 2021-10-19 (×2): 1000 mg via ORAL
  Filled 2021-10-18 (×2): qty 1

## 2021-10-18 MED ORDER — FUROSEMIDE 10 MG/ML IJ SOLN
80.0000 mg | Freq: Two times a day (BID) | INTRAMUSCULAR | Status: DC
  Administered 2021-10-18 – 2021-10-19 (×2): 80 mg via INTRAVENOUS
  Filled 2021-10-18 (×2): qty 8

## 2021-10-18 MED ORDER — LACTATED RINGERS IV BOLUS
1000.0000 mL | Freq: Once | INTRAVENOUS | Status: AC
  Administered 2021-10-18: 1000 mL via INTRAVENOUS

## 2021-10-18 MED ORDER — INSULIN GLARGINE-YFGN 100 UNIT/ML ~~LOC~~ SOLN: 50.0000 [IU] | SUBCUTANEOUS | Status: DC

## 2021-10-18 NOTE — Progress Notes (Signed)
Called mother and provided update. Answered all questions. Mom reports that Lynnette was unsure of what all was going on in this hospitalization and wanted [place] to talk with her mother so the mother, who is a CMA, could better explain to [place].   I will talk with the day team and come up with a plan to better communicate her ongoing medical issues so Brooks feels more engaged and in control of her care.   Ezequiel Essex, MD

## 2021-10-18 NOTE — Progress Notes (Signed)
Bladder scan 54 ml, verbalized urge to urinate and feeling of fullness of bladder. She refused foley cath to be reinserted again. Informed Dr. Shawna Orleans.

## 2021-10-18 NOTE — Progress Notes (Signed)
New order received for indwelling foley catheter placement. Perineal care provided prior to insertion.Indwelling 14 Fr. Latex foley catheter placed, 10 cc balloon intact and inflated. Cloudy urine return noted. Foley catheter secure with stat lock. Perineal care provided.

## 2021-10-18 NOTE — Progress Notes (Addendum)
Inform Dr Caron Presume regarding the blood sugar of 153 (goal met). He ordered to d/c endotool and resume diet.

## 2021-10-18 NOTE — Progress Notes (Signed)
Flushed foley cath per order. Seen yellowish sediments. Verbalized fullness on bladder. Remove foley cath. Debris seen on the tip - blood clots and yellowish drainage. Informed Dr. Caron Presume.   Reinserted foley with Charge Nurse Eual Fines, RN. Unsuccessful. No urine return on foley. Informed Dr Caron Presume. Will follow up.

## 2021-10-18 NOTE — Evaluation (Signed)
Physical Therapy Evaluation Patient Details Name: [name] MRN: [id number] DOB: 09/10/1976 Today's Date: 10/18/2021  History of Present Illness  Pt is a 45 y.o. female who presented 10/16/21 with left groin abscess, hyperglycemia, and chest pain. S/p I&D of L inguinal abscess. PMH: type 2 diabetes, morbid obesity, obesity hypoventilation syndrome, OSA wears BiPAP at night, hypertension, asthma, GERD, hyperlipidemia, restless leg syndrome, left BKA, R transmetatarsal amputation, mood disorder, hypertension, history of chest pain   Clinical Impression  Pt presents with condition above and deficits mentioned below, see PT Problem List. PTA, she was requiring 24/7 assistance either from her aide (x8 hrs 5 days/week) or her 94 y.o. daughter for all functional mobility and ADLs. Pt required some physical assistance to transition supine > sit but supervision for transfers and gait up to ~20 ft with a RW and her L leg prosthesis donned. Currently, pt is limited in mobility by abdominal/bladder pain and lack of having L leg prosthesis and R boot present in room. Educated pt to offload R foot wound through weight bearing through her heel only. She displays deficits in skin integrity, balance, activity tolerance, and strength. Pt reports desire to improve her strength, mobility, and independence, in particular with pericare. Recommending follow-up with Outpatient PT. Will continue to follow acutely.     Recommendations for follow up therapy are one component of a multi-disciplinary discharge planning process, led by the attending physician.  Recommendations may be updated based on patient status, additional functional criteria and insurance authorization.  Follow Up Recommendations Outpatient PT    Assistance Recommended at Discharge Frequent or constant Supervision/Assistance  Functional Status Assessment Patient has had a recent decline in their functional status and demonstrates the ability to make  significant improvements in function in a reasonable and predictable amount of time.  Equipment Recommendations  None recommended by PT    Recommendations for Other Services       Precautions / Restrictions Precautions Precautions: Fall Precaution Comments: prior R transmet amputation and L BKA, has L leg prosthesis (asked pt to get from home, awaiting) Restrictions Weight Bearing Restrictions: Yes RLE Weight Bearing:  (heel weight bearing to offload wound on forefoot) Other Position/Activity Restrictions: Pt reports she is supposed to wear a boot on R foot      Mobility  Bed Mobility Overal bed mobility: Needs Assistance Bed Mobility: Supine to Sit     Supine to sit: Mod assist;HOB elevated     General bed mobility comments: Pt able to initiate transition of legs off EOB and hips scooted to EOB, requesting PT's hand to pull on to ascend trunk, modA.    Transfers                   General transfer comment: Pt deferred lateral scooting EOB or attempts at standing without her prosthesis. Asked pt to have someone bring prosthesis and R foot boot for future sessions.    Ambulation/Gait               General Gait Details: Pt deferred OOB mobility without her prosthesis. Asked pt to have someone bring prosthesis and R foot boot for future sessions.  Stairs            Wheelchair Mobility    Modified Rankin (Stroke Patients Only)       Balance Overall balance assessment: Needs assistance Sitting-balance support: No upper extremity supported;Feet supported Sitting balance-Leahy Scale: Good         Standing balance comment: Pt deferred  OOB mobility without her prosthesis. Asked pt to have someone bring prosthesis and R foot boot for future sessions.                             Pertinent Vitals/Pain Pain Assessment: Faces Faces Pain Scale: Hurts whole lot Pain Location: bladder Pain Descriptors / Indicators:  Discomfort;Grimacing;Crying Pain Intervention(s): Limited activity within patient's tolerance;Monitored during session;Repositioned    Home Living Family/patient expects to be discharged to:: Private residence Living Arrangements: Children (29 y.o. daughter) Available Help at Discharge: Family;Personal care attendant;Available 24 hours/day (aide comes x8 hrs 5days/week) Type of Home: Apartment Home Access: Ramped entrance       Home Layout: One level Home Equipment: Rolling Walker (2 wheels);BSC/3in1;Grab bars - tub/shower;Tub bench;Wheelchair - manual;Other (comment) (hoyer lift) Additional Comments: has L leg prosthesis    Prior Function Prior Level of Function : Needs assist       Physical Assist : Mobility (physical);ADLs (physical) Mobility (physical): Bed mobility;Transfers;Gait ADLs (physical): Bathing;Dressing;Toileting;IADLs Mobility Comments: Pt requires assist to pull up to sit EOB and supervision for transfers and gait with RW and prosthesis donned up to ~20 ft, uses w/c for longer distance mobility ADLs Comments: Daughter or aide provides pericare and assists with bathing and dressing, cleaning, and cooking. Uses transportation services. Pt does shop but sits in w/c while pushing cart while daughter pushes the w/c.     Hand Dominance        Extremity/Trunk Assessment   Upper Extremity Assessment Upper Extremity Assessment: Defer to OT evaluation    Lower Extremity Assessment Lower Extremity Assessment: RLE deficits/detail;LLE deficits/detail RLE Deficits / Details: Prior transmet amputation with wound still at amputation site; decreased sensation in foot with peripheral neuropathy hx; MMT 0 at anterior tibialis; MMT 4+ knee flexion, 4 hip flexion RLE Sensation: decreased light touch;history of peripheral neuropathy LLE Deficits / Details: Prior BKA with pt reporting intact sensation at residual limb; MMT of 4+ hip flexion, 5 knee extension, 4 knee flexion     Cervical / Trunk Assessment Cervical / Trunk Assessment: Other exceptions (increased body habitus)  Communication   Communication: No difficulties  Cognition Arousal/Alertness: Awake/alert Behavior During Therapy: WFL for tasks assessed/performed Overall Cognitive Status: Within Functional Limits for tasks assessed                                          General Comments General comments (skin integrity, edema, etc.): VSS on [place]    Exercises     Assessment/Plan    PT Assessment Patient needs continued PT services  PT Problem List Decreased strength;Decreased range of motion;Decreased activity tolerance;Decreased balance;Decreased mobility;Cardiopulmonary status limiting activity;Obesity;Decreased skin integrity;Pain       PT Treatment Interventions DME instruction;Gait training;Functional mobility training;Therapeutic activities;Therapeutic exercise;Neuromuscular re-education;Balance training;Patient/family education;Wheelchair mobility training    PT Goals (Current goals can be found in the Care Plan section)  Acute Rehab PT Goals Patient Stated Goal: to get home to daughter PT Goal Formulation: With patient Time For Goal Achievement: 11/01/21 Potential to Achieve Goals: Good    Frequency Min 3X/week   Barriers to discharge        Co-evaluation               AM-PAC PT "6 Clicks" Mobility  Outcome Measure Help needed turning from your back to your side while in a  flat bed without using bedrails?: A Little Help needed moving from lying on your back to sitting on the side of a flat bed without using bedrails?: A Lot Help needed moving to and from a bed to a chair (including a wheelchair)?: A Little (assumed) Help needed standing up from a chair using your arms (e.g., wheelchair or bedside chair)?: A Little (assumed) Help needed to walk in hospital room?: A Little (assumed) Help needed climbing 3-5 steps with a railing? : Total 6 Click Score:  15    End of Session Equipment Utilized During Treatment: Oxygen Activity Tolerance: Patient limited by pain Patient left: in bed;with call bell/phone within reach Nurse Communication: Mobility status;Other (comment) (bed alarm off, pt verbalized understanding not to get OOB alone) PT Visit Diagnosis: Muscle weakness (generalized) (M62.81);Difficulty in walking, not elsewhere classified (R26.2);Pain Pain - part of body:  (abdomen/bladder)    Time: 4591-3685 PT Time Calculation (min) (ACUTE ONLY): 28 min   Charges:   PT Evaluation $PT Eval Moderate Complexity: 1 Mod PT Treatments $Therapeutic Activity: 8-22 mins        Moishe Spice, PT, DPT Acute Rehabilitation Services  Pager: [phone] Office: [phone]   Orvan Falconer 10/18/2021, 5:08 PM

## 2021-10-18 NOTE — Progress Notes (Addendum)
Family Medicine Teaching Service Daily Progress Note Intern Pager: [phone]  Patient name: [name] Medical record number: [id number] Date of birth: 01/18/1976 Age: 45 y.o. Gender: female  Primary Care Provider: [name], MD Consultants: General surgery Code Status: Full   Pt Overview and Major Events to Date:  11/11 admission, OR for I&D of left inguinal abscess  Assessment and Plan: [name] is a 45 y.o. year old female presenting with left groin abscess s/p I&D, hyperglycemia, and chest pain.  Past medical history significant for type 2 diabetes, morbid obesity, obesity hypoventilation syndrome, OSA wears BiPAP at night, hypertension, asthma, GERD, hyperlipidemia, restless leg syndrome, left BKA, mood disorder, hypertension, history of chest pain with low risk for cardiac etiology.  L groin abscess s/p I&D POD #1 Tolerated procedure well. WBC 13.4  Received Vanc and Zosyn initially which was stopped due to AKI. Currently on Ceftriaxone, Doxycycline, Flagyl. Dressing changes and pain meds per surgery - Tylenol 650 mg every 6 hours as needed - Percocet 7.5mg  every 4 hours as needed for moderate pain - Oxycodone 2.5 mg every 4 hours as needed for severe pain - Dilaudid 0.5 mg every 4 hours as needed for breakthrough pain - gabapentin 1000mg  BID decreased from TID   - Follow-up surgical cultures - daily CBC - PT/OT - wound care signed off- recommends Dr. Dellia Nims at outpatient wound care center for f/u  Acute renal failure  Oliguria  Current Cr 4.5, GFR 12 this morning, and on repeat 4 hours later 4.62. Cr 1.52 on admission with baseline around 1.15 Likely pre renal but potentially also due to contrast received as well as Vanc and zosyn given pre operatively. Urine output .1mg /kg/hr (510ml) over last 24 hours. It is documented that there was about 450 ml of output with in and out cath, but when foley placed no urine output but with yellowish drainage. Bladder scan with  61ml . Agreed to reinsert foley   - avoid nephrotoxic agents  - LR 150 mL/h - f/u urine culture - replace catheter  - strict Is & Os   - nephrologist Dr. Jonnie Finner consulted and will see patient this evening, appreciate recommendations    DM2 Last CBG 272. Was in DKA yesterday and endotool initiated and ended early this morning. Diet resumed. Given low sugars, will start at 50 units of Semglee twice daily, and will likely need to increase to 75 units daily Home regimen of 100 units degludec BID and novolog 50 units TID with weekly Trulicity injections on Sundays. - CBG monitoring 4 times daily, before meals and at bedtime  - Semglee 50 units twice daily - resistant SSI - diabetes coordinator  consulted  - carb modified diet   Chest pain Ruled out ACS on admission with troponins 22> 50> 29 and EKG with NSR . Unlikely to be cardiac in origin. Potentially from deconditioning vs anxiety related. Denies chest pain this am - continue to monitor   Asthma/ Obesity Hypoventilation syndrome  Home meds: Spiriva daily and albuterol as needed - Dulera 2 puffs BID   OSA Pt reports wearing BiPAP 3L at night - continue BiPAP  GERD - protonix 40mg    HLD Home med of atorvastatin 80mg  daily - con't home med   Ciprodex eardrops, 4 drops in right ear twice daily Nystatin topical BID   FEN/GI: Carb modified  PPx: Lovenox   Disposition: Home pending further work up. Need for IV antibitics and close monitoring of urine output and worsening kidney function.  Subjective:   Overnight patient was on Endotool for DKA. Resolved early this morning and diet resumed. Patient sleeping when I came into the room. She states groin is not painful unless touched. She expressed that her mom would be coming later and would want to talk to a provider. Denied any concerns or complaints   Objective: Temp:  [97.6 F (36.4 C)-99 F (37.2 C)] 99 F (37.2 C) (11/13 0417) Pulse Rate:  [74-86] 74 (11/13  0417) Resp:  [11-14] 11 (11/13 0417) BP: (101-129)/(54-74) 129/74 (11/13 0600) SpO2:  [94 %-100 %] 100 % (11/13 0417) Physical Exam: General: sleeping comfortably, NAD Cardiovascular: RRR no murmurs Respiratory: CTAB normal WOB Abdomen: soft, obese, non tender    Laboratory: Recent Labs  Lab 10/16/21 0950 10/16/21 1145 10/16/21 2029 10/17/21 0355  WBC 16.7*  --  17.8* 16.9*  HGB 11.3* 12.2 11.0* 10.8*  HCT 34.4* 36.0 32.9* 32.9*  PLT 338  --  315 322   Recent Labs  Lab 10/17/21 1345 10/17/21 1832 10/18/21 0020  NA 122* 123* 126*  K 4.0 4.6 3.6  CL 88* 90* 93*  CO2 18* 19* 20*  BUN 35* 36* 37*  CREATININE 3.78* 3.86* 4.11*  CALCIUM 7.4* 7.3* 7.4*  GLUCOSE 651* 608* 228*     Imaging/Diagnostic Tests:  US RENAL  Result Date: 10/18/2021 CLINICAL DATA:  45 year old female with history of acute kidney injury. EXAM: RENAL / URINARY TRACT ULTRASOUND COMPLETE COMPARISON:  No priors. FINDINGS: Comment: Poor quality study secondary to morbid obesity. Right Kidney: Renal measurements: 12.8 x 7.3 x 6.7 = volume: 327 mL. Echogenicity within normal limits. No mass or hydronephrosis visualized. Left Kidney: Renal measurements: 13.1 x 6.4 x 5.5 = volume: 240 mL. Echogenicity within normal limits. No mass or hydronephrosis visualized. Bladder: Appears normal for degree of bladder distention. Prevoid bladder volume estimated at 52 mL. Other: None. IMPRESSION: 1. No acute findings.  Specifically, no hydroureteronephrosis. Electronically Signed   By: Vinnie Langton M.D.   On: 10/18/2021 05:35     Shary Key, DO 10/18/2021, 7:41 AM PGY-2, [facility] Intern pager: [phone], text pages welcome

## 2021-10-18 NOTE — Progress Notes (Signed)
Received patient lying on bed. Alert and oriented x 4. On Endotool. On multiple IVF running. Patient complained of unable to urinate. She mentioned that she did not void the whole day. Bladder scan = 205 ml. In an out done. Obtained 450 ml greenish, cloudy urine. Informed Dr. Caron Presume and ordered to place indwelling foley catheter.

## 2021-10-18 NOTE — Progress Notes (Signed)
OT Cancellation Note  Patient Details Name: [name] MRN: [id number] DOB: August 12, 1976   Cancelled Treatment:    Reason Eval/Treat Not Completed: Patient declined, states that she just worked with PT and wants to rest. OT will continue for evaluation and will re-attempt tomorrow.   [place] 10/18/2021, 5:15 PM  Jesse Sans OTR/L Acute Rehabilitation Services Pager: [phone] Office: [phone]

## 2021-10-18 NOTE — Progress Notes (Signed)
Blood sugar = 221 mg/dl. Informed Dr. Quentin Cornwall. She said the team is trying to avoid giving dextrose.Continued LRS as ordered.

## 2021-10-18 NOTE — Progress Notes (Addendum)
Discussed with Dr. Jonnie Finner, nephro. Will stop maintenance IV fluids and give lasix 80 mg IV q12 hr per his recommendations.   Ezequiel Essex, MD PGY-2, [facility] Service pager [phone]

## 2021-10-18 NOTE — Progress Notes (Addendum)
Seen on round by Dr. Caron Presume. Patient verbalized fullness of bladder. Scan done = 0 ml. Informed about the decreased urine output, color and blood sugar. Send urine culture per order. Flushed foley catheter per advised. He verbalized to replace catheter.

## 2021-10-18 NOTE — Consult Note (Signed)
Renal Service Consult Note [facility]  Magaline Steinberg 10/18/2021 Sol Blazing, MD Requesting Physician: Dr Erin Hearing  Reason for Consult: Renal failure HPI: The patient is a 45 y.o. 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 Went to OR same day for I&D. She rec'd IV vanc/ zosyn and had IV contrast w/ pelvic CT on 11/11. Creat on admission was 1.52, then rose to 2.7 yesterday and up to 4.11 this morning. Asked to see for renal failure.   Pt seen in room.  No severe SOB, no confusion or jerking of extremities.   Pt states that a few years ago she had kidney problems and almost had to go on dialysis. Pt has foley in place.     ROS - denies CP, no joint pain, no HA, no blurry vision, no rash, no diarrhea, no nausea/ vomiting, no dysuria, no difficulty voiding   Past Medical History  Past Medical History:  Diagnosis Date   Acute osteomyelitis of ankle or foot, left ([place]) 01/31/2018   Alveolar hypoventilation    Anemia    not on iron pill   Asthma    Bipolar 2 disorder ([name])    Carpal tunnel syndrome on right    recurrent   Cellulitis 08/2010-08/2011   Chronic pain    COPD (chronic obstructive pulmonary disease) ([name])    Symbicort daily and Proventil as needed   Costochondritis    Diabetes mellitus type II, uncontrolled 2000   Type 2, Uncontrolled.Takes Lantus daily.Fasting blood sugar runs 150   Drug-seeking behavior    GERD (gastroesophageal reflux disease)    takes Pantoprazole and Zantac daily   HLD (hyperlipidemia)    takes Atorvastatin daily   Hypertension    takes Lisinopril and Coreg daily   Morbid obesity ([name])    Nocturia    OSA on CPAP    Peripheral neuropathy    takes Gabapentin daily   Pneumonia    "walking" several yrs ago and as a baby (12/05/2018)   Rectal fissure    Restless leg    SVT  (supraventricular tachycardia) ([name])    Syncope 02/25/2016   Urinary frequency    Urinary incontinence 10/23/2020   Varicose veins    Right medial thigh and Left leg    Past Surgical History  Past Surgical History:  Procedure Laterality Date   AMPUTATION Left 02/01/2018   Procedure: LEFT FOURTH AND 5TH TOE RAY AMPUTATION;  Surgeon: [name], MD;  Location: [place];  Service: Orthopedics;  Laterality: Left;   AMPUTATION Left 03/03/2018   Procedure: LEFT BELOW KNEE AMPUTATION;  Surgeon: [name], MD;  Location: [place];  Service: Orthopedics;  Laterality: Left;   CARPAL TUNNEL RELEASE Bilateral    CESAREAN SECTION  2007   CORONARY ANGIOGRAPHY N/A 11/26/2020   Procedure: CORONARY ANGIOGRAPHY;  Surgeon: Sherren Mocha, MD;  Location: [place] [facility];  Service: Cardiovascular;  Laterality: N/A;   INCISION AND DRAINAGE ABSCESS Left 10/16/2021   Procedure: INCISION AND DRAINAGE ABSCESS;  Surgeon: Dwan Bolt, MD;  Location: [place];  Service: General;  Laterality: Left;   INCISION AND DRAINAGE PERIRECTAL ABSCESS Left 05/18/2019   Procedure: IRRIGATION AND DEBRIDEMENT OF PANNIS ABSCESS, POSSIBLE DEBRIDEMENT OF BUTTOCK WOUND;  Surgeon: Donnie Mesa, MD;  Location: [place];  Service: General;  Laterality: Left;   IRRIGATION AND DEBRIDEMENT  BUTTOCKS Left 05/17/2019   Procedure: IRRIGATION AND DEBRIDEMENT BUTTOCKS;  Surgeon: Donnie Mesa, MD;  Location: [place];  Service: General;  Laterality: Left;   KNEE ARTHROSCOPY Right 07/17/2010   LEFT HEART CATHETERIZATION WITH CORONARY ANGIOGRAM N/A 07/27/2012   Procedure: LEFT HEART CATHETERIZATION WITH CORONARY ANGIOGRAM;  Surgeon: Sherren Mocha, MD;  Location: [facility] C[facility];  Service: Cardiovascular;  Laterality: N/A;   MASS EXCISION N/A 06/29/2013   Procedure:  WIDE LOCAL EXCISION OF POSTERIOR NECK ABSCESS;  Surgeon: Ralene Ok, MD;  Location: [place];  Service: General;  Laterality: N/A;   REPAIR KNEE LIGAMENT Left    "fixed ligaments and  chipped patella"   right transmetatarsal amputation      Family History  Family History  Problem Relation Age of Onset   Diabetes Mother    Hyperlipidemia Mother    Depression Mother    GER disease Mother    Allergic rhinitis Mother    Restless legs syndrome Mother    Heart attack Paternal Uncle    Heart disease Paternal Grandmother    Heart attack Paternal Grandmother    Heart attack Paternal Grandfather    Heart disease Paternal Grandfather    Heart attack Father    Migraines Sister    Cancer Maternal Grandmother        COLON   Hypertension Maternal Grandmother    Hyperlipidemia Maternal Grandmother    Diabetes Maternal Grandmother    Other Maternal Grandfather        GUN SHOT   Anxiety disorder Sister    Asthma Child    Social History  reports that she quit smoking about 15 years ago. Her smoking use included cigarettes. She has a 3.75 pack-year smoking history. She has never used smokeless tobacco. She reports that she does not currently use drugs. She reports that she does not drink alcohol. Allergies  Allergies  Allergen Reactions   Cefepime Other (See Comments)    AKI, see records from [place] hospitalization in January 2020.   Other reaction(s): Other (See Comments)  Note pt has tolerated Rocephin and Keflex   Kiwi Extract Shortness Of Breath, Swelling and Anaphylaxis   Morphine And Related Nausea And Vomiting   Nalbuphine     Other reaction(s): Hallucinations, Other (See Comments) "FEELS LIKE SOMETHING CRAWLING ON ME" Reaction:  Nervousness "FEELS LIKE SOMETHING CRAWLING ON ME" Reaction:  Nervousness Reaction:  Nervousness "FEELS LIKE SOMETHING CRAWLING ON ME"    Trental [Pentoxifylline] Nausea And Vomiting   Toradol [Ketorolac Tromethamine] Other (See Comments)    Feels like something is crawling on me   Morphine Nausea And Vomiting   Nubain [Nalbuphine Hcl] Other (See Comments)    "FEELS LIKE SOMETHING CRAWLING ON ME"   Home medications Prior to  Admission medications   Medication Sig Start Date End Date Taking? Authorizing Provider  albuterol (VENTOLIN HFA) 108 (90 Base) MCG/ACT inhaler INHALE 1 TO 2 PUFFS BY MOUTH EVERY SIX HOURS AS NEEDED FOR WHEEZING OR SHORTNESS OF BREATH Patient taking differently: Inhale 1 puff into the lungs every 6 (six) hours as needed for shortness of breath. 07/16/21  Yes [name], MD  amLODipine (NORVASC) 10 MG tablet Take 1 tablet by mouth daily. 12/28/18  Yes [provider]  aspirin EC 81 MG tablet Take 1 tablet (81 mg total) by mouth daily. Swallow whole. 11/19/20  Yes Sherren Mocha, MD  atorvastatin (LIPITOR) 80 MG tablet Take 1 tablet (80 mg total) by mouth daily. 02/11/21  Yes [name],  MD  Cholecalciferol 1000 units tablet Take 1 tablet (1,000 Units total) by mouth daily. 04/05/17  Yes [name], MD  Ferrous Sulfate (IRON) 325 (65 Fe) MG TABS Take 1 tablet (325 mg total) by mouth every other day. 03/31/21  Yes [name], MD  gabapentin (NEURONTIN) 600 MG tablet Take 2 tablets (1,200 mg total) by mouth 3 (three) times daily. 08/12/21  Yes [name], MD  insulin degludec (TRESIBA FLEXTOUCH) 200 UNIT/ML FlexTouch Pen Inject 90 Units into the skin 2 (two) times daily. Patient taking differently: Inject 100 Units into the skin 2 (two) times daily. 06/17/21  Yes [name], MD  metoprolol succinate (TOPROL-XL) 25 MG 24 hr tablet Take 25 mg by mouth daily. 10/23/20  Yes [provider]  NOVOLOG FLEXPEN 100 UNIT/ML FlexPen INJECT 40 UNITS SUBCUTANEOUSLY 3 TIMES DAILY WITH MEALS Patient taking differently: Inject 50 Units into the skin 3 (three) times daily with meals. 09/03/21  Yes [name], MD  omeprazole (PRILOSEC) 40 MG capsule Take 1 capsule (40 mg total) by mouth daily. 11/14/20  Yes Martyn Ehrich, NP  ondansetron (ZOFRAN-ODT) 4 MG disintegrating tablet Dissolve 1 tablet under the tongue every 8 hours as needed for  nausea. Patient taking differently: Take 4 mg by mouth every 8 (eight) hours as needed for vomiting or nausea. 06/29/21  Yes [name], MD  oxyCODONE-acetaminophen (PERCOCET) 7.5-325 MG tablet Take 1 tablet by mouth every 8 (eight) hours as needed for severe pain. 09/29/21  Yes [name], MD  potassium chloride SA (KLOR-CON M20) 20 MEQ tablet Take 1 tablet (20 mEq total) by mouth daily. 09/09/20  Yes Weaver, Scott T, PA-C  ramelteon (ROZEREM) 8 MG tablet TAKE 1 TABLET BY MOUTH AT BEDTIME Patient taking differently: Take 8 mg by mouth at bedtime. 10/06/21  Yes [name], MD  rOPINIRole (REQUIP) 1 MG tablet Take 1 tablet (1 mg total) by mouth at bedtime. 08/12/20  Yes [name], MD  SYMBICORT 160-4.5 MCG/ACT inhaler INHALE 2 PUFFS INTO THE LUNGS 2 TIMES DAILY Patient taking differently: Inhale 2 puffs into the lungs 2 (two) times daily. 07/16/21  Yes [name], MD  Tiotropium Bromide Monohydrate (SPIRIVA RESPIMAT) 1.25 MCG/ACT AERS Inhale 2 puffs into the lungs daily. 11/14/20  Yes Martyn Ehrich, NP  torsemide (DEMADEX) 20 MG tablet Take 4 tablets (80 mg total) by mouth daily. 07/02/21  Yes [name], MD  TRULICITY 1.5 TW/6.5KC SOPN inject 1.50m into THE SKIN ONCE A WEEK Patient taking differently: Inject 1.75 mg as directed once a week. 05/01/21  Yes MLeeanne Rio MD  Accu-Chek FastClix Lancets MISC USE DAILY AS INSTRUCTED 05/18/21   MLeeanne Rio MD  Accu-Chek Softclix Lancets lancets Use to check blood glucose four times daily 04/17/20   BMartyn Malay MD  Continuous Blood Gluc Sensor (DEXCOM G6 SENSOR) MISC Inject 1 applicator into the skin as directed. Change sensor every 10 days. 10/16/20   KLeavy Cella RPH-CPP  Continuous Blood Gluc Transmit (DEXCOM G6 TRANSMITTER) MISC Inject 1 Device into the skin as directed. Reuse 8 times with sensor changes. 09/03/21   MLeeanne Rio MD  GNP ULTICARE PEN NEEDLES 32G X 4  MM MISC USE TO inject insulin 2 TIMES DAILY 10/02/21   MLeeanne Rio MD  Misc. Devices (TRANSFER BENCH) MISC Use daily 10/29/20   MLeeanne Rio MD  oxyCODONE-acetaminophen (PERCOCET) 7.5-325 MG tablet Take 1 tablet by mouth  every 8 (eight) hours as needed for severe pain. Patient not taking: No sig reported 09/29/21   [name], MD     Vitals:   10/17/21 2353 10/18/21 0417 10/18/21 0600 10/18/21 1434  BP: 101/63 101/61 129/74 (!) 147/91  Pulse: 81 74  90  Resp:  11  11  Temp: 98.1 F (36.7 C) 99 F (37.2 C)  98.3 F (36.8 C)  TempSrc: Axillary Axillary  Oral  SpO2:  100%    Weight:      Height:       Exam Gen alert, no distress, morbidly obese No rash, cyanosis or gangrene Sclera anicteric, throat clear  No jvd or bruits Chest clear bilat to bases, no rales/ wheezing RRR no MRG Abd soft marked abd obesity, ntnd no mass or ascites +bs GU foley in place draining yellowish urine in small amts MS no joint effusions or deformity Ext R leg w 1-2+ edema, L BKA 1-2+ edema, UE's 1-2+ edema Neuro is alert, Ox 3 , nf      Home meds include - norvasc 10, asa 81, lipitor, neurontin, insulin tresiba/ novolog, toprol xl 25 qd, PPI, percocet prn, Klor-con    UA 11/12 - turbid, > 50 wbcs/ rbcs, 20- 50 epis, few bacteria    CXR 11/11 - no active disease     UNa, UCr pending     Total I/O since admit = 10.0 L in and 760 cc UOP = +9.3 L net      B/l creat from May 2022 = 1.02- 1.37, eGFR 49- >60 ml/min    Renal US > 12- 13 cm kidneys w/o hydro    Assessment/ Plan: AKI on CKD 3a - b/l creat from May 2022 is 1.02- 1.37, eGFR 49- >60 ml/min. Creat here 1.5 on admission 3 days ago and creat up to 4.1/ 4.6 today in setting of admission for L groin abscess, now sp I&D. Pt is vol overloaded, 9 L + in 2 days. UA looks infected, renal US w/o obstruction. No shock. AKI likely ATN due to contrast injury +/- IV abx (vanc) toxicity. Made urine yesterday but not much today.  Will start diuretics w/ IV lasix 80 bid. Get urine lytes. Will follow.  L groin abscess - vanc/ zosyn/ flagyl dc'd, getting IV doxy/ Rocephin now.  PAD hx L BKA Morbid obesity DM2 on insulin HTN - BP's soft, holding home BP medications      Rob Marri Mcneff  MD 10/18/2021, 8:06 PM  Recent Labs  Lab 10/17/21 0355 10/18/21 0833  WBC 16.9* 13.4*  HGB 10.8* 9.8*   Recent Labs  Lab 10/18/21 1223 10/18/21 1806  K 4.2 4.0  BUN 38* 38*  CREATININE 4.62* 4.55*  CALCIUM 7.9* 7.9*

## 2021-10-18 NOTE — Plan of Care (Signed)
  Problem: Education: Goal: Knowledge of General Education information will improve Description: Including pain rating scale, medication(s)/side effects and non-pharmacologic comfort measures Outcome: Progressing   Problem: Health Behavior/Discharge Planning: Goal: Ability to manage health-related needs will improve Outcome: Progressing   Problem: Clinical Measurements: Goal: Ability to maintain clinical measurements within normal limits will improve Outcome: Progressing   Problem: Clinical Measurements: Goal: Diagnostic test results will improve Outcome: Progressing   Problem: Clinical Measurements: Goal: Respiratory complications will improve Outcome: Progressing   Problem: Clinical Measurements: Goal: Cardiovascular complication will be avoided Outcome: Progressing   Problem: Coping: Goal: Level of anxiety will decrease Outcome: Progressing   Problem: Elimination: Goal: Will not experience complications related to urinary retention Outcome: Progressing   Problem: Pain Managment: Goal: General experience of comfort will improve Outcome: Progressing   Problem: Safety: Goal: Ability to remain free from injury will improve Outcome: Progressing   Problem: Skin Integrity: Goal: Risk for impaired skin integrity will decrease Outcome: Progressing

## 2021-10-18 NOTE — Progress Notes (Signed)
  Received page from RN that patient's mom was in the room and wanted to talk to someone. Went to check on patient this afternoon and mom was not in the room and patient was sleeping. Will circle back and get mom's number so that the team can update her if patient desires. Also checked urine output which was about 15 mL. Will continue to monitor closely.

## 2021-10-19 DIAGNOSIS — N179 Acute kidney failure, unspecified: Secondary | ICD-10-CM | POA: Diagnosis not present

## 2021-10-19 DIAGNOSIS — I5030 Unspecified diastolic (congestive) heart failure: Secondary | ICD-10-CM

## 2021-10-19 DIAGNOSIS — E1165 Type 2 diabetes mellitus with hyperglycemia: Secondary | ICD-10-CM | POA: Diagnosis not present

## 2021-10-19 DIAGNOSIS — L02214 Cutaneous abscess of groin: Secondary | ICD-10-CM | POA: Diagnosis not present

## 2021-10-19 DIAGNOSIS — N1831 Chronic kidney disease, stage 3a: Secondary | ICD-10-CM | POA: Diagnosis present

## 2021-10-19 LAB — BASIC METABOLIC PANEL
Anion gap: 13 (ref 5–15)
Anion gap: 14 (ref 5–15)
BUN: 43 mg/dL — ABNORMAL HIGH (ref 6–20)
BUN: 45 mg/dL — ABNORMAL HIGH (ref 6–20)
CO2: 17 mmol/L — ABNORMAL LOW (ref 22–32)
CO2: 19 mmol/L — ABNORMAL LOW (ref 22–32)
Calcium: 7.3 mg/dL — ABNORMAL LOW (ref 8.9–10.3)
Calcium: 7.7 mg/dL — ABNORMAL LOW (ref 8.9–10.3)
Chloride: 93 mmol/L — ABNORMAL LOW (ref 98–111)
Chloride: 92 mmol/L — ABNORMAL LOW (ref 98–111)
Creatinine, Ser: 4.99 mg/dL — ABNORMAL HIGH (ref 0.44–1.00)
Creatinine, Ser: 5.59 mg/dL — ABNORMAL HIGH (ref 0.44–1.00)
GFR, Estimated: 10 mL/min — ABNORMAL LOW (ref >60)
GFR, Estimated: 9 mL/min — ABNORMAL LOW (ref >60)
Glucose, Bld: 330 mg/dL — ABNORMAL HIGH (ref 70–99)
Glucose, Bld: 325 mg/dL — ABNORMAL HIGH (ref 70–99)
Potassium: 3.9 mmol/L (ref 3.5–5.1)
Potassium: 4.2 mmol/L (ref 3.5–5.1)
Sodium: 123 mmol/L — ABNORMAL LOW (ref 135–145)
Sodium: 125 mmol/L — ABNORMAL LOW (ref 135–145)

## 2021-10-19 LAB — URINE CULTURE: Culture: NO GROWTH

## 2021-10-19 LAB — AEROBIC/ANAEROBIC CULTURE W GRAM STAIN (SURGICAL/DEEP WOUND): Culture: NORMAL

## 2021-10-19 LAB — GLUCOSE, CAPILLARY
Glucose-Capillary: 280 mg/dL — ABNORMAL HIGH (ref 70–99)
Glucose-Capillary: 331 mg/dL — ABNORMAL HIGH (ref 70–99)
Glucose-Capillary: 413 mg/dL — ABNORMAL HIGH (ref 70–99)
Glucose-Capillary: 343 mg/dL — ABNORMAL HIGH (ref 70–99)
Glucose-Capillary: 319 mg/dL — ABNORMAL HIGH (ref 70–99)
Glucose-Capillary: 319 mg/dL — ABNORMAL HIGH (ref 70–99)

## 2021-10-19 LAB — CBC
HCT: 30.3 % — ABNORMAL LOW (ref 36.0–46.0)
Hemoglobin: 9.7 g/dL — ABNORMAL LOW (ref 12.0–15.0)
MCH: 30.6 pg (ref 26.0–34.0)
MCHC: 32 g/dL (ref 30.0–36.0)
MCV: 95.6 fL (ref 80.0–100.0)
Platelets: 372 10*3/uL (ref 150–400)
RBC: 3.17 MIL/uL — ABNORMAL LOW (ref 3.87–5.11)
RDW: 14.2 % (ref 11.5–15.5)
WBC: 12.1 10*3/uL — ABNORMAL HIGH (ref 4.0–10.5)
nRBC: 0 % (ref 0.0–0.2)

## 2021-10-19 LAB — SODIUM, URINE, RANDOM: Sodium, Ur: 46 mmol/L

## 2021-10-19 LAB — CREATININE, URINE, RANDOM: Creatinine, Urine: 99.21 mg/dL

## 2021-10-19 MED ORDER — POLYETHYLENE GLYCOL 3350 17 G PO PACK
17.0000 g | PACK | Freq: Every day | ORAL | Status: DC
  Administered 2021-10-19 – 2021-10-20 (×2): 17 g via ORAL
  Filled 2021-10-19 (×2): qty 1

## 2021-10-19 MED ORDER — FUROSEMIDE 10 MG/ML IJ SOLN
120.0000 mg | Freq: Two times a day (BID) | INTRAVENOUS | Status: DC
  Administered 2021-10-19 – 2021-10-20 (×3): 120 mg via INTRAVENOUS
  Filled 2021-10-19 (×3): qty 10
  Filled 2021-10-19 (×2): qty 12

## 2021-10-19 MED ORDER — UMECLIDINIUM BROMIDE 62.5 MCG/ACT IN AEPB
1.0000 | INHALATION_SPRAY | RESPIRATORY_TRACT | Status: DC | PRN
  Filled 2021-10-19: qty 7

## 2021-10-19 MED ORDER — GABAPENTIN 300 MG PO CAPS
600.0000 mg | ORAL_CAPSULE | Freq: Two times a day (BID) | ORAL | Status: DC
  Administered 2021-10-19 – 2021-10-21 (×4): 600 mg via ORAL
  Filled 2021-10-19 (×4): qty 2

## 2021-10-19 MED ORDER — INSULIN ASPART 100 UNIT/ML IJ SOLN
0.0000 [IU] | Freq: Three times a day (TID) | INTRAMUSCULAR | Status: DC
  Administered 2021-10-19: 15 [IU] via SUBCUTANEOUS
  Administered 2021-10-20 (×2): 7 [IU] via SUBCUTANEOUS
  Administered 2021-10-20 – 2021-10-21 (×2): 11 [IU] via SUBCUTANEOUS
  Administered 2021-10-21: 7 [IU] via SUBCUTANEOUS
  Administered 2021-10-21: 3 [IU] via SUBCUTANEOUS
  Administered 2021-10-22: 12:00:00 4 [IU] via SUBCUTANEOUS
  Administered 2021-10-22 – 2021-10-23 (×2): 3 [IU] via SUBCUTANEOUS
  Administered 2021-10-23 (×2): 4 [IU] via SUBCUTANEOUS
  Administered 2021-10-24: 7 [IU] via SUBCUTANEOUS
  Administered 2021-10-24 – 2021-10-25 (×4): 4 [IU] via SUBCUTANEOUS

## 2021-10-19 MED ORDER — MOMETASONE FURO-FORMOTEROL FUM 200-5 MCG/ACT IN AERO
2.0000 | INHALATION_SPRAY | Freq: Two times a day (BID) | RESPIRATORY_TRACT | Status: DC | PRN
  Filled 2021-10-19: qty 8.8

## 2021-10-19 NOTE — Progress Notes (Signed)
Pt had 50cc cloudy urine with lots of sediment after IV lasix given per orders. Specimens sent per orders. Will continue to monitor. Jessie Foot, RN

## 2021-10-19 NOTE — Progress Notes (Addendum)
CSW received consult from nurse for social concern/check for patient. CSW met with patient at bedside. Patient confirmed she comes from home with daughter and two roommates. Patient reports she has the medicaid caps program 8 hours a day. Patient reports she has support at home. Patient reports she also has assist from her mother if needed.All questions answered.Assessment complete. No further questions reported at this time.  

## 2021-10-19 NOTE — Progress Notes (Signed)
Family Medicine Teaching Service Daily Progress Note Intern Pager: [phone]  Patient name: [name] Medical record number: [id number] Date of birth: 05-Nov-1976 Age: 45 y.o. Gender: female  Primary Care Provider: [name], MD Consultants: Nephrology, Wound care, General surgery Code Status: Full  Pt Overview and Major Events to Date:  11/11: admission, OR for I&D of left inguinal abscess  Assessment and Plan: Patient is a 45 year old female who presented with left groin abscess s/p I&D, hyperglycemia, and chest pain.  PMH of T2DM, morbid obesity, obesity hypoventilation syndrome, OSA requiring nighttime BiPAP, HTN, asthma, GERD, hyperlipidemia, RLS, left BKA, mood disorder, and history of chest pain with low risk for cardiac etiology.  Left groin abscess s/p I&D POD #2 WBC downtrending, most recently 13.4.  Initially received vancomycin and Zosyn, which were stopped 2/2 AKI.  Currently on ceftriaxone, doxycycline, and Flagyl.  Dressing changes and pain meds per surgery recs. Wound improving. - Continue IV antibiotics today 2/2 patient nausea; we will reassess switching to p.o. antibiotics tomorrow. -As needed pain regimen: Tylenol 650 mg every 6 hours mild pain, Percocet 7.5 mg every 4 hours moderate pain. - Decrease gabapentin to 600 mg twice daily (decreased from 3 times daily) - Follow-up surgical cultures - Continue daily CBC - Wound care signed off, recommending Dr. Dellia Nims at outpatient wound care center for follow-up.  Acute renal failure  aneuric Creatinine up-trending, currently 4.99, GFR 10.  Baseline creatinine 1.15.  Per nephro, likely intrarenal 2/2 contrast and Vanc/Zosyn given preoperatively.  UOP 25 mL over the past 24 hours.  FeNa 1.8%, indicative of intrarenal injury. Per nursing, patient had a 50 cc cloudy urine with lots of sediment after IV Lasix given. - Continue to avoid nephrotoxic agents - IV fluids discontinued per nephrology. Appreciate recs:  -  Follow-up urine culture - Continue strict I's and O's  Constipation Patient reports no BM since Thursday. -Scheduled Miralax QD.  T2DM Last CBG 261, 153-273 over the past 24 hours.  Patient was in DKA 11/12 with endotool initiated and ended in a.m. 11/13.  Given lower sugars, Semglee will be started at 50 units twice daily, but will likely need to increase to 75 units twice daily.  Home regimen: Degludec 100 units twice daily, NovoLog 15 units 3 times daily, and weekly Trulicity injections on Sundays. - Continue CBG monitoring 4 times daily, before meals and at bedtime. - Continue Semglee 50 units twice daily - Continue rSSI with bedtime correction - Follow-up diabetes coordinator consultation  Chest pain ACS ruled out on admission with troponins 22> 50> 29 and EKG with NSR, so unlikely to be cardiac in origin.  Likely from deconditioning versus anxiety.  Denies chest pain this a.m. - Continue to monitor  Asthma  obesity hypoventilation syndrome Home meds: Spiriva daily and albuterol as needed - Continue Dulera 2 puffs twice daily and Incruse 1 puff daily  OSA Patient reports when BiPAP 3 L nightly - Continue BiPAP  GERD Chronic, stable - Continue home Protonix 40 mg  HLD Chronic, stable - Continue home atorvastatin 80 mg daily  Ciprodex eardrops, 4 drops in right ear twice daily Nystatin topical twice daily to affected areas  FEN/GI: Carb modified PPx: Lovenox Dispo: Home, pending clinical improvement.  Barriers include for IV antibiotics and close monitoring of UOP in light of worsening kidney function.  Subjective:  Patient reports intermittent nausea, but denies pain.  She has concerns about p.o. antibiotics and wound healing as well as dietary restrictions, and this Probation officer discussed  with her how her uncontrolled diabetes affects wound healing despite administration route of antibiotics as well that her diet is to control her blood glucose/ renal protective. Although  frustrated, she voiced understanding.  Objective: Temp:  [97.5 F (36.4 C)-97.9 F (36.6 C)] 97.9 F (36.6 C) (11/14 0557) Pulse Rate:  [82-96] 92 (11/14 0557) Resp:  [13-20] 18 (11/14 1158) BP: (104-153)/(72-97) 134/78 (11/14 1158) SpO2:  [97 %-100 %] 97 % (11/14 1158)  Physical Exam: General: Obese, age congruent Cardiovascular: RRR, no murmurs nor abnormal heart sounds appreciated, but auscultation limited by body habitus Respiratory: CTA x2, normal effort of breathing on 3 L nasal cannula. Abdomen: Obese, soft, nontender, nondistended Extremities: Left BKA, right foot with dressing C/D/I  Laboratory: Recent Labs  Lab 10/17/21 0355 10/18/21 0833 10/19/21 0625  WBC 16.9* 13.4* 12.1*  HGB 10.8* 9.8* 9.7*  HCT 32.9* 29.1* 30.3*  PLT 322 334 372   Recent Labs  Lab 10/18/21 0833 10/18/21 1223 10/18/21 1806 10/19/21 0145 10/19/21 1230  NA 127*   < > 125* 123* 125*  K 3.6   < > 4.0 3.9 4.2  CL 92*   < > 90* 93* 92*  CO2 21*   < > 19* 17* 19*  BUN 38*   < > 38* 43* 45*  CREATININE 4.50*   < > 4.55* 4.99* 5.59*  CALCIUM 7.6*   < > 7.9* 7.3* 7.7*  PROT 7.1  --   --   --   --   BILITOT 0.4  --   --   --   --   ALKPHOS 187*  --   --   --   --   ALT 12  --   --   --   --   AST 13*  --   --   --   --   GLUCOSE 243*   < > 247* 330* 325*   < > = values in this interval not displayed.   Repeat CBG 413, given 9 Units SSI, and CBG 343   Imaging/Diagnostic Tests: Renal ultrasound showed no acute findings  Rosezetta Schlatter, MD 10/19/2021, 3:31 PM PGY-1, [place] Intern pager: [phone], text pages welcome

## 2021-10-19 NOTE — Evaluation (Signed)
Occupational Therapy Evaluation Patient Details Name: [name] MRN: [id number] DOB: 1976-06-11 Today's Date: 10/19/2021   History of Present Illness 45 y.o. female who presented 10/16/21 with left groin abscess, hyperglycemia, and chest pain. S/p I&D of L inguinal abscess. PMH: type 2 diabetes, morbid obesity, obesity hypoventilation syndrome, OSA wears BiPAP at night, hypertension, asthma, GERD, hyperlipidemia, restless leg syndrome, left BKA, R transmetatarsal amputation, mood disorder, hypertension, history of chest pain   Clinical Impression   PTA, pt was living with her 34 yo daughter and required assistance for BADLs (including peri care, LB bathing, LB dressing) from her daughter and aide. Pt currently requiring Min A for UB ADLs, Max A for LB ADLs, and Min A for squat pivot to recliner. Pt presenting with decreased balance, ROM, and safety. Pt would benefit from further acute OT to facilitate safe dc. Recommend dc to home with HHOT for further OT to optimize safety, independence with ADLs, and return to PLOF.      Recommendations for follow up therapy are one component of a multi-disciplinary discharge planning process, led by the attending physician.  Recommendations may be updated based on patient status, additional functional criteria and insurance authorization.   Follow Up Recommendations  Home health OT    Assistance Recommended at Discharge Intermittent Supervision/Assistance  Functional Status Assessment  Patient has had a recent decline in their functional status and demonstrates the ability to make significant improvements in function in a reasonable and predictable amount of time.  Equipment Recommendations  None recommended by OT    Recommendations for Other Services PT consult     Precautions / Restrictions Precautions Precautions: Fall Precaution Comments: prior R transmet amputation and L BKA, has L leg prosthesis (asked pt to get from home, awaiting). Boot for  R foot Restrictions Weight Bearing Restrictions: Yes RLE Weight Bearing: Partial weight bearing Other Position/Activity Restrictions: Pt reports she is supposed to wear a boot on R foot      Mobility Bed Mobility Overal bed mobility: Needs Assistance Bed Mobility: Supine to Sit     Supine to sit: Mod assist;HOB elevated     General bed mobility comments: Mod A to pull into upright position    Transfers Overall transfer level: Needs assistance Equipment used: 1 person hand held assist Transfers: Bed to chair/wheelchair/BSC     Squat pivot transfers: Min assist       General transfer comment: Pt realies heavily on UE support and reaching out for different support systems. Refusing walker so holding onto therapists hand, sink, recliner, and bed. Min A for balance and to position at reclienr before pt sitting      Balance Overall balance assessment: Needs assistance Sitting-balance support: No upper extremity supported;Feet supported Sitting balance-Leahy Scale: Good       Standing balance-Leahy Scale: Poor Standing balance comment: reliant on UE support                           ADL either performed or assessed with clinical judgement   ADL Overall ADL's : Needs assistance/impaired Eating/Feeding: Set up;Sitting   Grooming: Set up;Sitting   Upper Body Bathing: Minimal assistance;Sitting   Lower Body Bathing: Maximal assistance;Sit to/from stand   Upper Body Dressing : Minimal assistance;Sitting   Lower Body Dressing: Maximal assistance;Sit to/from stand Lower Body Dressing Details (indicate cue type and reason): Max A for donning boot on RLE and prothetic on LLE  Functional mobility during ADLs: Minimal assistance (squat pivot) General ADL Comments: Pt presenting with decreased balance, strength and ROM     Vision Baseline Vision/History: 1 Wears glasses       Perception     Praxis      Pertinent Vitals/Pain Pain  Assessment: Faces Faces Pain Scale: Hurts whole lot Pain Location: bladder Pain Descriptors / Indicators: Discomfort;Grimacing;Crying Pain Intervention(s): Monitored during session;Limited activity within patient's tolerance;Repositioned     Hand Dominance Right   Extremity/Trunk Assessment Upper Extremity Assessment Upper Extremity Assessment: Overall WFL for tasks assessed   Lower Extremity Assessment Lower Extremity Assessment: LLE deficits/detail;RLE deficits/detail RLE Deficits / Details: Prior transmet amputation with wound still at amputation site; decreased sensation in foot with peripheral neuropathy hx; MMT 0 at anterior tibialis; MMT 4+ knee flexion, 4 hip flexion RLE Sensation: decreased light touch;history of peripheral neuropathy LLE Deficits / Details: Prior BKA with pt reporting intact sensation at residual limb; MMT of 4+ hip flexion, 5 knee extension, 4 knee flexion   Cervical / Trunk Assessment Cervical / Trunk Assessment: Other exceptions (increased body habitus)   Communication Communication Communication: No difficulties   Cognition Arousal/Alertness: Awake/alert Behavior During Therapy: WFL for tasks assessed/performed Overall Cognitive Status: Within Functional Limits for tasks assessed                                       General Comments  VSS on RA. Requesting 2L O2 for pivot to chair. SpO2 96% on RA and 98% on 2L    Exercises     Shoulder Instructions      Home Living Family/patient expects to be discharged to:: Private residence Living Arrangements: Children (35 y.o. daughter; "some roommates that i am going to kick out") Available Help at Discharge: Family;Personal care attendant;Available 24 hours/day (aide comes x8 hrs 5days/week) Type of Home: Apartment Home Access: Ramped entrance     Home Layout: One level     Bathroom Shower/Tub: Tub/shower unit;Sponge bathes at baseline (sponge bathes unless trusts aide to assist in  shower transfer)   Bathroom Toilet: Standard Bathroom Accessibility: No (has to manage RW sideways into bathroom)   Home Equipment: Rolling Walker (2 wheels);BSC/3in1;Grab bars - tub/shower;Tub bench;Wheelchair - manual;Other (comment) (hoyer lift)   Additional Comments: has L leg prosthesis      Prior Functioning/Environment Prior Level of Function : Needs assist       Physical Assist : Mobility (physical);ADLs (physical) Mobility (physical): Bed mobility;Transfers;Gait ADLs (physical): Bathing;Dressing;Toileting;IADLs Mobility Comments: Pt requires assist to pull up to sit EOB and supervision for transfers and gait with RW and prosthesis donned up to ~20 ft, uses w/c for longer distance mobility. ADLs Comments: Daughter or aide provides pericare and assists with bathing and dressing, cleaning, and cooking. Uses transportation services. Pt does shop but sits in w/c while pushing cart while daughter pushes the w/c.        OT Problem List: Decreased strength;Decreased activity tolerance;Decreased range of motion;Decreased knowledge of use of DME or AE;Decreased knowledge of precautions;Decreased cognition;Pain      OT Treatment/Interventions: Self-care/ADL training;Therapeutic exercise;Energy conservation;DME and/or AE instruction;Therapeutic activities;Patient/family education    OT Goals(Current goals can be found in the care plan section) Acute Rehab OT Goals Patient Stated Goal: Go home to my daughter OT Goal Formulation: With patient Time For Goal Achievement: 11/02/21 Potential to Achieve Goals: Good  OT Frequency: Min 3X/week   Barriers  to D/C:            Co-evaluation              AM-PAC OT "6 Clicks" Daily Activity     Outcome Measure Help from another person eating meals?: A Little Help from another person taking care of personal grooming?: A Little Help from another person toileting, which includes using toliet, bedpan, or urinal?: A Lot Help from  another person bathing (including washing, rinsing, drying)?: A Lot Help from another person to put on and taking off regular upper body clothing?: A Little Help from another person to put on and taking off regular lower body clothing?: A Lot 6 Click Score: 15   End of Session Equipment Utilized During Treatment: Oxygen Nurse Communication: Mobility status  Activity Tolerance: Patient tolerated treatment well Patient left: in chair;with call bell/phone within reach  OT Visit Diagnosis: Unsteadiness on feet (R26.81);Other abnormalities of gait and mobility (R26.89);Muscle weakness (generalized) (M62.81)                Time: 7127-8718 OT Time Calculation (min): 40 min Charges:  OT General Charges $OT Visit: 1 Visit OT Evaluation $OT Eval Moderate Complexity: 1 Mod OT Treatments $Self Care/Home Management : 23-37 mins  Graceann Boileau MSOT, OTR/L Acute Rehab Pager: [phone] Office: [place] 10/19/2021, 1:27 PM

## 2021-10-19 NOTE — Progress Notes (Signed)
Spoke with on call nephrologist, Dr. Marval Regal regarding pt's lack of response to 80 mg Lasix IV. Only 345 cc charted thus far. Pt has Foley catheter.  She takes 80 mg torsemide daily at home.  Recommended increasing to 120 mg IV.   Discontinued 80 mg IV Lasix BID.  Ordered 120 mg Lasix IV BID.   Lyndee Hensen, DO PGY-3, [facility] 10/19/2021

## 2021-10-19 NOTE — Progress Notes (Signed)
Sent Dr. Marcina Millard secure chat to let MD know that patient's CBG is 413 and that 15 units of insulin is due per meal coverage and sliding scale orders. Dr. Marcina Millard called back and acknowled page and gave order to give only 9 units of insulin and then recheck cbg in 1  hour.

## 2021-10-19 NOTE — Progress Notes (Signed)
FPTS Brief Progress Note  S:Patient sitting upright on edge of bed, on video call. She reports the nausea is much improved. She is concerned about not producing urine. We discussed her main hospital problems, all questions answered. Her main complaint right now is the food available on her heart healthy carb modified diet. She says "she can't find anything to eat" on the menu. Reports she normally likes to eat a protein and a vegetable for meals when she's at home.   O: BP (!) 153/89 (BP Location: Right Arm)   Pulse 96   Temp (!) 97.5 F (36.4 C) (Oral)   Resp 18   Ht 5\' 2"  (1.575 m)   Wt (!) 154.2 kg   SpO2 100%   BMI 62.18 kg/m    A/P: - will discuss diet restrictions with day team, will likely still need carb modified diet to help with uncontrolled glucose levels - awaiting formal note from Dr. Jonnie Finner, nephro - will follow UOP s/p lasix 80 mg IV per Dr. Jonnie Finner recommendations - Orders reviewed. Labs for AM ordered, which was adjusted as needed.  - If condition changes, plan includes nephro consult.   Ezequiel Essex, MD 10/19/2021, 12:09 AM PGY-2,  Family Medicine Night Resident  Please page [phone] with questions.

## 2021-10-19 NOTE — Progress Notes (Signed)
Patient ID: [name], female   DOB: [birth year], 45 y.o.   MRN: [id number] S: No complaints or events overnight. O:BP 132/83 (BP Location: Left Arm)   Pulse 92   Temp 97.9 F (36.6 C) (Oral)   Resp 20   Ht '5\' 2"'  (1.575 m)   Wt (!) 154.2 kg   SpO2 100%   BMI 62.18 kg/m   Intake/Output Summary (Last 24 hours) at 10/19/2021 1031 Last data filed at 10/19/2021 0600 Gross per 24 hour  Intake 3983.4 ml  Output 25 ml  Net 3958.4 ml   Intake/Output: I/O last 3 completed shifts: In: 7015.8 [P.O.:600; I.V.:3360.3; IV Piggyback:3055.5] Out: 685 [Urine:605; Drains:80]  Intake/Output this shift:  No intake/output data recorded. Weight change:  Gen: NAD CVS: RRR Resp: CTA Abd: +Bs, soft, NT/ND Ext: no edema  Recent Labs  Lab 10/17/21 1345 10/17/21 1832 10/18/21 0020 10/18/21 0833 10/18/21 1223 10/18/21 1806 10/19/21 0145  NA 122* 123* 126* 127* 127* 125* 123*  K 4.0 4.6 3.6 3.6 4.2 4.0 3.9  CL 88* 90* 93* 92* 94* 90* 93*  CO2 18* 19* 20* 21* 21* 19* 17*  GLUCOSE 651* 608* 228* 243* 281* 247* 330*  BUN 35* 36* 37* 38* 38* 38* 43*  CREATININE 3.78* 3.86* 4.11* 4.50* 4.62* 4.55* 4.99*  ALBUMIN  --   --   --  1.9*  --   --   --   CALCIUM 7.4* 7.3* 7.4* 7.6* 7.9* 7.9* 7.3*  AST  --   --   --  13*  --   --   --   ALT  --   --   --  12  --   --   --    Liver Function Tests: Recent Labs  Lab 10/18/21 0833  AST 13*  ALT 12  ALKPHOS 187*  BILITOT 0.4  PROT 7.1  ALBUMIN 1.9*   No results for input(s): LIPASE, AMYLASE in the last 168 hours. No results for input(s): AMMONIA in the last 168 hours. CBC: Recent Labs  Lab 10/16/21 0950 10/16/21 1145 10/16/21 2029 10/17/21 0355 10/18/21 0833 10/19/21 0625  WBC 16.7*  --  17.8* 16.9* 13.4* 12.1*  HGB 11.3*   < > 11.0* 10.8* 9.8* 9.7*  HCT 34.4*   < > 32.9* 32.9* 29.1* 30.3*  MCV 94.5  --  92.2 93.7 93.3 95.6  PLT 338  --  315 322 334 372   < > = values in this interval not displayed.   Cardiac Enzymes: No results for  input(s): CKTOTAL, CKMB, CKMBINDEX, TROPONINI in the last 168 hours. CBG: Recent Labs  Lab 10/18/21 1116 10/18/21 1537 10/18/21 1706 10/18/21 2059 10/19/21 0748  GLUCAP 272* 273* 269* 261* 331*    Iron Studies: No results for input(s): IRON, TIBC, TRANSFERRIN, FERRITIN in the last 72 hours. Studies/Results: US RENAL  Result Date: 10/18/2021 CLINICAL DATA:  45 year old female with history of acute kidney injury. EXAM: RENAL / URINARY TRACT ULTRASOUND COMPLETE COMPARISON:  No priors. FINDINGS: Comment: Poor quality study secondary to morbid obesity. Right Kidney: Renal measurements: 12.8 x 7.3 x 6.7 = volume: 327 mL. Echogenicity within normal limits. No mass or hydronephrosis visualized. Left Kidney: Renal measurements: 13.1 x 6.4 x 5.5 = volume: 240 mL. Echogenicity within normal limits. No mass or hydronephrosis visualized. Bladder: Appears normal for degree of bladder distention. Prevoid bladder volume estimated at 52 mL. Other: None. IMPRESSION: 1. No acute findings.  Specifically, no hydroureteronephrosis. Electronically Signed   By: Vinnie Langton  M.D.   On: 10/18/2021 05:35    atorvastatin  80 mg Oral q1800   chlorhexidine  15 mL Mouth/Throat Once   Or   mouth rinse  15 mL Mouth Rinse Once   Chlorhexidine Gluconate Cloth  6 each Topical Daily   ciprofloxacin-dexamethasone  4 drop Right EAR BID   ferrous sulfate  325 mg Oral QODAY   furosemide  80 mg Intravenous Q12H   gabapentin  1,000 mg Oral BID   insulin aspart  0-5 Units Subcutaneous QHS   insulin aspart  0-9 Units Subcutaneous TID WC   insulin aspart  6 Units Subcutaneous TID WC   insulin glargine-yfgn  50 Units Subcutaneous BID   mometasone-formoterol  2 puff Inhalation BID   nystatin   Topical BID   pantoprazole  40 mg Oral QHS   umeclidinium bromide  1 puff Inhalation Daily    BMET    Component Value Date/Time   NA 123 (L) 10/19/2021 0145   NA 139 04/24/2021 1119   K 3.9 10/19/2021 0145   CL 93 (L)  10/19/2021 0145   CO2 17 (L) 10/19/2021 0145   GLUCOSE 330 (H) 10/19/2021 0145   BUN 43 (H) 10/19/2021 0145   BUN 49 (H) 04/24/2021 1119   CREATININE 4.99 (H) 10/19/2021 0145   CREATININE 0.51 08/05/2015 1114   CALCIUM 7.3 (L) 10/19/2021 0145   GFRNONAA 10 (L) 10/19/2021 0145   GFRAA 77 12/09/2020 1605   CBC    Component Value Date/Time   WBC 12.1 (H) 10/19/2021 0625   RBC 3.17 (L) 10/19/2021 0625   HGB 9.7 (L) 10/19/2021 0625   HGB 12.4 04/24/2021 1119   HCT 30.3 (L) 10/19/2021 0625   HCT 39.7 04/24/2021 1119   PLT 372 10/19/2021 0625   PLT 453 (H) 04/24/2021 1119   MCV 95.6 10/19/2021 0625   MCV 96 04/24/2021 1119   MCH 30.6 10/19/2021 0625   MCHC 32.0 10/19/2021 0625   RDW 14.2 10/19/2021 0625   RDW 13.6 04/24/2021 1119   LYMPHSABS 2.8 04/27/2021 0632   LYMPHSABS 4.5 (H) 09/19/2018 0928   MONOABS 1.0 04/27/2021 0632   EOSABS 0.2 04/27/2021 0632   EOSABS 0.2 09/19/2018 0928   BASOSABS 0.0 04/27/2021 0632   BASOSABS 0.1 09/19/2018 0928      Assessment/ Plan: AKI on CKD 3a - b/l creat from May 2022 is 1.02- 1.37, eGFR 49- >60 ml/min. Creat here 1.5 on admission 3 days ago and creat up to 4.1/ 4.6 today in setting of admission for L groin abscess, now sp I&D. Pt is vol overloaded, 9 L + in 2 days. UA looks infected, renal US w/o obstruction. No shock. AKI likely ATN due to contrast injury +/- IV abx (vanc) toxicity. Made urine yesterday but not much today.  Started on IV lasix 80 bid without much response.  Discussed the possible need for dialysis if her renal function continues to worsen.   No uremic symptoms or indication for urgent dialysis at this time. Will continue to follow.  L groin abscess - vanc/ zosyn/ flagyl dc'd, getting IV doxy/ Rocephin now.  PAD hx L BKA Morbid obesity DM2 on insulin HTN - BP's soft, holding home BP medications  Donetta Potts, MD [employer] [phone]

## 2021-10-19 NOTE — Progress Notes (Signed)
Paged Dr. Marcina Millard and MD called back. RN made MD aware that patient continues to complain of nausea and per order can not get PRN zofran until 1530. MD gave order to go ahead and give PRN zofran early at this time.

## 2021-10-19 NOTE — Progress Notes (Signed)
Paged Dr. Florene Glen and made MD aware that patient's blood glucose is 343 1 hour after giving 9 units of insulin and made MD aware that patient is complaining of nausea and that per PRN order is not able to have more zofran until 1530. MD acknowledged and stated he would talk to the team about patient's blood glucose and possibly add something for nausea.

## 2021-10-19 NOTE — Progress Notes (Signed)
Progress Note  3 Days Post-Op  Subjective: Feeling better.  Denies pain except when her pannus is lifted to look at wound.  Blood sugars still in 300s.  Objective: Vital signs in last 24 hours: Temp:  [97.5 F (36.4 C)-98.3 F (36.8 C)] 97.9 F (36.6 C) (11/14 0557) Pulse Rate:  [82-96] 92 (11/14 0557) Resp:  [11-20] 20 (11/14 0809) BP: (132-153)/(83-97) 132/83 (11/14 0809) SpO2:  [100 %] 100 % (11/14 0809) Last BM Date: 10/15/21  Intake/Output from previous day: 11/13 0701 - 11/14 0700 In: 4333.4 [P.O.:480; I.V.:1896.9; IV Piggyback:1956.5] Out: 25 [Urine:25] Intake/Output this shift: No intake/output data recorded.  PE: skin:   Mons pannus still with some erythema but less indurated. Wound with no gross purulent drainage.  ABD with some cloudy serosang drainage present.  Penrose in place    Lab Results:  Recent Labs    10/18/21 0833 10/19/21 0625  WBC 13.4* 12.1*  HGB 9.8* 9.7*  HCT 29.1* 30.3*  PLT 334 372   BMET Recent Labs    10/18/21 1806 10/19/21 0145  NA 125* 123*  K 4.0 3.9  CL 90* 93*  CO2 19* 17*  GLUCOSE 247* 330*  BUN 38* 43*  CREATININE 4.55* 4.99*  CALCIUM 7.9* 7.3*   PT/INR No results for input(s): LABPROT, INR in the last 72 hours. CMP     Component Value Date/Time   NA 123 (L) 10/19/2021 0145   NA 139 04/24/2021 1119   K 3.9 10/19/2021 0145   CL 93 (L) 10/19/2021 0145   CO2 17 (L) 10/19/2021 0145   GLUCOSE 330 (H) 10/19/2021 0145   BUN 43 (H) 10/19/2021 0145   BUN 49 (H) 04/24/2021 1119   CREATININE 4.99 (H) 10/19/2021 0145   CREATININE 0.51 08/05/2015 1114   CALCIUM 7.3 (L) 10/19/2021 0145   PROT 7.1 10/18/2021 0833   ALBUMIN 1.9 (L) 10/18/2021 0833   AST 13 (L) 10/18/2021 0833   ALT 12 10/18/2021 0833   ALKPHOS 187 (H) 10/18/2021 0833   BILITOT 0.4 10/18/2021 0833   GFRNONAA 10 (L) 10/19/2021 0145   GFRAA 77 12/09/2020 1605   Lipase     Component Value Date/Time   LIPASE 56 (H) 11/01/2020 1720        Studies/Results: US RENAL  Result Date: 10/18/2021 CLINICAL DATA:  44 year old female with history of acute kidney injury. EXAM: RENAL / URINARY TRACT ULTRASOUND COMPLETE COMPARISON:  No priors. FINDINGS: Comment: Poor quality study secondary to morbid obesity. Right Kidney: Renal measurements: 12.8 x 7.3 x 6.7 = volume: 327 mL. Echogenicity within normal limits. No mass or hydronephrosis visualized. Left Kidney: Renal measurements: 13.1 x 6.4 x 5.5 = volume: 240 mL. Echogenicity within normal limits. No mass or hydronephrosis visualized. Bladder: Appears normal for degree of bladder distention. Prevoid bladder volume estimated at 52 mL. Other: None. IMPRESSION: 1. No acute findings.  Specifically, no hydroureteronephrosis. Electronically Signed   By: Vinnie Langton M.D.   On: 10/18/2021 05:35    Anti-infectives: Anti-infectives (From admission, onward)    Start     Dose/Rate Route Frequency Ordered Stop   10/17/21 1245  doxycycline (VIBRAMYCIN) 100 mg in sodium chloride 0.9 % 250 mL IVPB        100 mg 125 mL/hr over 120 Minutes Intravenous Every 12 hours 10/17/21 1157     10/16/21 2230  cefTRIAXone (ROCEPHIN) 2 g in sodium chloride 0.9 % 100 mL IVPB        2 g 200 mL/hr over 30  Minutes Intravenous Every 24 hours 10/16/21 2143     10/16/21 2230  metroNIDAZOLE (FLAGYL) IVPB 500 mg  Status:  Discontinued        500 mg 100 mL/hr over 60 Minutes Intravenous Every 12 hours 10/16/21 2143 10/17/21 1156   10/16/21 1345  piperacillin-tazobactam (ZOSYN) IVPB 3.375 g        3.375 g 100 mL/hr over 30 Minutes Intravenous  Once 10/16/21 1341 10/16/21 1534   10/16/21 1215  vancomycin (VANCOCIN) 2,500 mg in sodium chloride 0.9 % 500 mL IVPB        2,500 mg 250 mL/hr over 120 Minutes Intravenous  Once 10/16/21 1201 10/16/21 1438        Assessment/Plan POD 3, s/p I&D for L groin abscess - penrose drain in place with ABD to cover -WBC down to 12 -cx still with no growth - will order  hydrotherapy to wash this area out as she is unable to get in the shower here due to her BKA, etc. 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

## 2021-10-20 ENCOUNTER — Telehealth: Payer: Self-pay

## 2021-10-20 DIAGNOSIS — G4733 Obstructive sleep apnea (adult) (pediatric): Secondary | ICD-10-CM | POA: Diagnosis not present

## 2021-10-20 DIAGNOSIS — E1165 Type 2 diabetes mellitus with hyperglycemia: Secondary | ICD-10-CM | POA: Diagnosis not present

## 2021-10-20 DIAGNOSIS — L0291 Cutaneous abscess, unspecified: Secondary | ICD-10-CM

## 2021-10-20 DIAGNOSIS — N179 Acute kidney failure, unspecified: Secondary | ICD-10-CM | POA: Diagnosis not present

## 2021-10-20 LAB — BASIC METABOLIC PANEL
Anion gap: 14 (ref 5–15)
BUN: 49 mg/dL — ABNORMAL HIGH (ref 6–20)
CO2: 18 mmol/L — ABNORMAL LOW (ref 22–32)
Calcium: 7.7 mg/dL — ABNORMAL LOW (ref 8.9–10.3)
Chloride: 94 mmol/L — ABNORMAL LOW (ref 98–111)
Creatinine, Ser: 5.62 mg/dL — ABNORMAL HIGH (ref 0.44–1.00)
GFR, Estimated: 9 mL/min — ABNORMAL LOW (ref >60)
Glucose, Bld: 247 mg/dL — ABNORMAL HIGH (ref 70–99)
Potassium: 4.1 mmol/L (ref 3.5–5.1)
Sodium: 126 mmol/L — ABNORMAL LOW (ref 135–145)

## 2021-10-20 LAB — GLUCOSE, CAPILLARY
Glucose-Capillary: 251 mg/dL — ABNORMAL HIGH (ref 70–99)
Glucose-Capillary: 245 mg/dL — ABNORMAL HIGH (ref 70–99)
Glucose-Capillary: 275 mg/dL — ABNORMAL HIGH (ref 70–99)
Glucose-Capillary: 243 mg/dL — ABNORMAL HIGH (ref 70–99)
Glucose-Capillary: 242 mg/dL — ABNORMAL HIGH (ref 70–99)

## 2021-10-20 MED ORDER — SENNA 8.6 MG PO TABS
1.0000 | ORAL_TABLET | Freq: Every day | ORAL | Status: DC
  Administered 2021-10-21: 8.6 mg via ORAL
  Filled 2021-10-20: qty 1

## 2021-10-20 MED ORDER — ACETAMINOPHEN 325 MG PO TABS
650.0000 mg | ORAL_TABLET | Freq: Four times a day (QID) | ORAL | Status: DC
  Administered 2021-10-21 – 2021-10-25 (×3): 650 mg via ORAL
  Filled 2021-10-20 (×9): qty 2

## 2021-10-20 MED ORDER — ACETAMINOPHEN 650 MG RE SUPP: 650.0000 mg | Freq: Four times a day (QID) | RECTAL | Status: DC

## 2021-10-20 MED ORDER — INSULIN GLARGINE-YFGN 100 UNIT/ML ~~LOC~~ SOLN
75.0000 [IU] | Freq: Two times a day (BID) | SUBCUTANEOUS | Status: DC
Start: 2021-10-20 — End: 2021-10-22
  Administered 2021-10-20 – 2021-10-22 (×3): 75 [IU] via SUBCUTANEOUS
  Filled 2021-10-20 (×6): qty 0.75

## 2021-10-20 NOTE — Progress Notes (Signed)
Patient ID: [name], female   DOB: [birth year], 45 y.o.   MRN: [id number] S: Complaining of pain from abscess drainage site.  Also has some nausea but no vomiting.  O:BP (!) 136/93 (BP Location: Right Arm)   Pulse 96   Temp 97.6 F (36.4 C) (Oral)   Resp 13   Ht '5\' 2"'  (1.575 m)   Wt (!) 154.2 kg   SpO2 96%   BMI 62.18 kg/m   Intake/Output Summary (Last 24 hours) at 10/20/2021 1204 Last data filed at 10/20/2021 0800 Gross per 24 hour  Intake 796.18 ml  Output 2650 ml  Net -1853.82 ml   Intake/Output: I/O last 3 completed shifts: In: 1386.2 [P.O.:720; IV Piggyback:666.2] Out: 2750 [Urine:2750]  Intake/Output this shift:  Total I/O In: 240 [P.O.:240] Out: -  Weight change:  QHU:TMLYY, NAD CVS: RRR Resp:CTA Abd: obese, +BS, soft, NT/ND Ext: no edema  Recent Labs  Lab 10/18/21 0020 10/18/21 0833 10/18/21 1223 10/18/21 1806 10/19/21 0145 10/19/21 1230 10/20/21 0430  NA 126* 127* 127* 125* 123* 125* 126*  K 3.6 3.6 4.2 4.0 3.9 4.2 4.1  CL 93* 92* 94* 90* 93* 92* 94*  CO2 20* 21* 21* 19* 17* 19* 18*  GLUCOSE 228* 243* 281* 247* 330* 325* 247*  BUN 37* 38* 38* 38* 43* 45* 49*  CREATININE 4.11* 4.50* 4.62* 4.55* 4.99* 5.59* 5.62*  ALBUMIN  --  1.9*  --   --   --   --   --   CALCIUM 7.4* 7.6* 7.9* 7.9* 7.3* 7.7* 7.7*  AST  --  13*  --   --   --   --   --   ALT  --  12  --   --   --   --   --    Liver Function Tests: Recent Labs  Lab 10/18/21 0833  AST 13*  ALT 12  ALKPHOS 187*  BILITOT 0.4  PROT 7.1  ALBUMIN 1.9*   No results for input(s): LIPASE, AMYLASE in the last 168 hours. No results for input(s): AMMONIA in the last 168 hours. CBC: Recent Labs  Lab 10/16/21 0950 10/16/21 1145 10/16/21 2029 10/17/21 0355 10/18/21 0833 10/19/21 0625  WBC 16.7*  --  17.8* 16.9* 13.4* 12.1*  HGB 11.3*   < > 11.0* 10.8* 9.8* 9.7*  HCT 34.4*   < > 32.9* 32.9* 29.1* 30.3*  MCV 94.5  --  92.2 93.7 93.3 95.6  PLT 338  --  315 322 334 372   < > = values in this  interval not displayed.   Cardiac Enzymes: No results for input(s): CKTOTAL, CKMB, CKMBINDEX, TROPONINI in the last 168 hours. CBG: Recent Labs  Lab 10/19/21 1616 10/19/21 2118 10/20/21 0612 10/20/21 0804 10/20/21 1156  GLUCAP 319* 319* 251* 245* 275*    Iron Studies: No results for input(s): IRON, TIBC, TRANSFERRIN, FERRITIN in the last 72 hours. Studies/Results: No results found.  acetaminophen  650 mg Oral Q6H   Or   acetaminophen  650 mg Rectal Q6H   atorvastatin  80 mg Oral q1800   chlorhexidine  15 mL Mouth/Throat Once   Or   mouth rinse  15 mL Mouth Rinse Once   Chlorhexidine Gluconate Cloth  6 each Topical Daily   ciprofloxacin-dexamethasone  4 drop Right EAR BID   ferrous sulfate  325 mg Oral QODAY   gabapentin  600 mg Oral BID   insulin aspart  0-20 Units Subcutaneous TID WC   insulin aspart  0-5 Units Subcutaneous QHS   insulin aspart  6 Units Subcutaneous TID WC   insulin glargine-yfgn  75 Units Subcutaneous BID   nystatin   Topical BID   pantoprazole  40 mg Oral QHS   polyethylene glycol  17 g Oral Daily    BMET    Component Value Date/Time   NA 126 (L) 10/20/2021 0430   NA 139 04/24/2021 1119   K 4.1 10/20/2021 0430   CL 94 (L) 10/20/2021 0430   CO2 18 (L) 10/20/2021 0430   GLUCOSE 247 (H) 10/20/2021 0430   BUN 49 (H) 10/20/2021 0430   BUN 49 (H) 04/24/2021 1119   CREATININE 5.62 (H) 10/20/2021 0430   CREATININE 0.51 08/05/2015 1114   CALCIUM 7.7 (L) 10/20/2021 0430   GFRNONAA 9 (L) 10/20/2021 0430   GFRAA 77 12/09/2020 1605   CBC    Component Value Date/Time   WBC 12.1 (H) 10/19/2021 0625   RBC 3.17 (L) 10/19/2021 0625   HGB 9.7 (L) 10/19/2021 0625   HGB 12.4 04/24/2021 1119   HCT 30.3 (L) 10/19/2021 0625   HCT 39.7 04/24/2021 1119   PLT 372 10/19/2021 0625   PLT 453 (H) 04/24/2021 1119   MCV 95.6 10/19/2021 0625   MCV 96 04/24/2021 1119   MCH 30.6 10/19/2021 0625   MCHC 32.0 10/19/2021 0625   RDW 14.2 10/19/2021 0625   RDW 13.6  04/24/2021 1119   LYMPHSABS 2.8 04/27/2021 0632   LYMPHSABS 4.5 (H) 09/19/2018 0928   MONOABS 1.0 04/27/2021 0632   EOSABS 0.2 04/27/2021 0632   EOSABS 0.2 09/19/2018 0928   BASOSABS 0.0 04/27/2021 0632   BASOSABS 0.1 09/19/2018 0928    Assessment/ Plan: AKI on CKD 3a - b/l creat from May 2022 is 1.02- 1.37, eGFR 49- >60 ml/min. Creat here 1.5 on admission 3 days ago and creat up to 4.1/ 4.6 today in setting of admission for L groin abscess, now sp I&D. Pt is vol overloaded, 9 L + in 2 days. UA looks infected, renal US w/o obstruction. No shock. AKI likely ATN due to contrast injury +/- IV abx (vanc) toxicity. Made urine yesterday but not much today.  Started on IV lasix 80 bid without much response but had marked diuresis after dose of 120 mg IV. Scr appears to have reached a plateau and hopefully will see it start to improve over the next 24-48 hours Discussed the possible need for dialysis if her renal function continues to worsen.   No uremic symptoms or indication for urgent dialysis at this time. Will continue to follow.  L groin abscess - vanc/ zosyn/ flagyl dc'd, getting IV doxy/ Rocephin now.  PAD hx L BKA Morbid obesity DM2 on insulin HTN - BP's soft, holding home BP medications  Donetta Potts, MD [employer] [phone]
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
## 2021-10-20 NOTE — Progress Notes (Signed)
Inpatient Diabetes Program Recommendations  AACE/ADA: New Consensus Statement on Inpatient Glycemic Control (2015)  Target Ranges:  Prepandial:   less than 140 mg/dL      Peak postprandial:   less than 180 mg/dL (1-2 hours)      Critically ill patients:  140 - 180 mg/dL   Lab Results  Component Value Date   GLUCAP 242 (H) 10/20/2021   HGBA1C >15.0 09/29/2021    Review of Glycemic Control  Diabetes history: DM2 Outpatient Diabetes medications: Tresiba 100 units BID, Novolog 50 units TID, Trulicity 0.26 mg weekly Current orders for Inpatient glycemic control: Semglee 75 units BID, Novolog 0-20 units TID with meals and 0-5 HS + 6 units TID  HgbA1C - > 15.0%  Inpatient Diabetes Program Recommendations:    Agree with current insulin titration.  Spoke with pt at bedside this afternoon regarding her diabetes control, home insulin doses and further discussion about U-500 insulin and importance of talking with PCP about her concerns. Pt shared her frustrations with diabetes, and states she's "just tired of it." Empathetic listening and pt talked about how she didn't like the food on her CHO mod heart healthy diet. We discussed making healthy choices at home and using portion control. Discussed HgbA1C of > 15% and importance of improving her glucose control. Has Dexcom CGM. Pt very appreciative of visit. Needs lots of encouragement.  Thank you. Lorenda Peck, RD, LDN, CDE Inpatient Diabetes Coordinator [phone]

## 2021-10-20 NOTE — Telephone Encounter (Signed)
Community message sent to [employer] for bariatric BSC. Will await response.   Talbot Grumbling, RN

## 2021-10-20 NOTE — Progress Notes (Signed)
Family Medicine Teaching Service Daily Progress Note Intern Pager: [phone]  Patient name: [name] Medical record number: [id number] Date of birth: Feb 25, 1976 Age: 45 y.o. Gender: female  Primary Care Provider: [name], MD Consultants: Nephrology, General Surgery Code Status: Full  Pt Overview and Major Events to Date:  11/11- Admitted, I&D 11/13- Nephrology consulted  Assessment and Plan: [name] is a 45 year old female who presented with left groin abscess, s/p I&D, and onset of acute renal failure now receiving IV diuresis.   Left groin abscess s/p I&D, POD #4 with penrose drain Afebrile. Pain is worse this am, mons pannus with erythema, exquisitely tender to touch, no purulent drainage noted. Will schedule Tylenol. Continue IV abx until gen surg weighs in today.  - Continue IV CTX, IV Doxycycline - Pain regimen: Tylenol 652m q6h scheduled for mild pain, Percocet 7.5-325 q4h for moderate/severe pain - Decrease gabpentin to 600 mg BID - Follow up surgical cultures - Wound care signed off, outpatient follow up at wound center   Acute renal failure on CKD Stage 3a; no longer anuric Baseline Cr from 04/2021 1.02-1.37, eGFR 49->60. Cr plateau- 5.59 yesterday -> 5.62 today. BUN 49, GFR 9. Making significantly better UOP since increasing lasix to 1266mIV yesterday- received 2 doses. Net + 7.8L since admission, down from + 9L yesterday. Foley in place draining urine, 35066mn bag- less sediment, straw-colored and mostly clear. Total 24h UOP 2.7L. Less likely obstruction as she is making good urine output and renal US [place] no acute findings/hydroureteronephrosis. Had encephalopathy d/t Cefepime in the past. AKI thought to be due to contrast injury +/- antibiotic toxicity. No uremic symptoms/urgent need for dialysis. review, had acute renal failure - Continue IV Lasix 120m33mD diuresis, per nephro - Nephrology following, appreciate their recommendations  T2DM More  recent CBGs: 200-350s. Total insulin aspart 24h: 47u. - CBG monitoring 4 times daily, before meals and bedtime - Increase Semglee to 75u BID - Resistant SSI with bedtime correction  Chronic hypercapneic and hypoxemic respiratory failure, OHS/OSA Chronic. On home oxygen. Currently on room air with normal work of breathing. - Use CPAP at night - Continue Dulera, Incruse Ellipta - Encourage IS, OOB  HTN Holding home meds (Toprol- XL 25 mg daily) - Monitor VS  Constipation No BM since Thursday. - Increase Miralax to BID  Other conditions (HLD, GERD, OSA) chronic and stable.  FEN/GI: Carb modified PPx: SCDs Dispo:Home pending clinical improvement. Barriers: Antibiotics, UOP monitoring, inpatient workup for kidney dysfunction.  Subjective:  Pt reports pain in groin region worse from yesterday. She is also on her menses. She denies any significant nausea, and was able to eat her breakfast this morning without difficulty. No vomiting or diarrhea. I discussed the plan regarding her lasix, antibiotics, and she understood. She tells me that Cefepime caused an adverse reaction in the past.  Her daughter is home sick with strep and she wants to go home to her.  Objective: Temp:  [97.6 F (36.4 C)] 97.6 F (36.4 C) (11/14 1621) Pulse Rate:  [88-92] 89 (11/15 0305) Resp:  [12-25] 12 (11/15 0305) BP: (104-146)/(72-91) 109/88 (11/15 0305) SpO2:  [95 %-98 %] 96 % (11/15 0305) Physical Exam: General: Obese, chronically ill appering, sitting at edge of bed eating breakfast Cardiovascular: RRR Respiratory: Normal work of breathing on room air. Difficult to auscultate 2/2 to body habitus but no wheezing or crackles noted. Abdomen: Obese, difficult to palpate. GU: Abscess in groin region erythematous, tender to palpation. No  areas of drainage or bleeding noted. Extremities: Left BKA, right leg with dressing on.  Laboratory: Recent Labs  Lab 10/17/21 0355 10/18/21 0833 10/19/21 0625  WBC  16.9* 13.4* 12.1*  HGB 10.8* 9.8* 9.7*  HCT 32.9* 29.1* 30.3*  PLT 322 334 372   Recent Labs  Lab 10/18/21 0833 10/18/21 1223 10/19/21 0145 10/19/21 1230 10/20/21 0430  NA 127*   < > 123* 125* 126*  K 3.6   < > 3.9 4.2 4.1  CL 92*   < > 93* 92* 94*  CO2 21*   < > 17* 19* 18*  BUN 38*   < > 43* 45* 49*  CREATININE 4.50*   < > 4.99* 5.59* 5.62*  CALCIUM 7.6*   < > 7.3* 7.7* 7.7*  PROT 7.1  --   --   --   --   BILITOT 0.4  --   --   --   --   ALKPHOS 187*  --   --   --   --   ALT 12  --   --   --   --   AST 13*  --   --   --   --   GLUCOSE 243*   < > 330* 325* 247*   < > = values in this interval not displayed.    Imaging/Diagnostic Tests: No results found.   Orvis Brill, DO 10/20/2021, 8:34 AM PGY-1, [place] Intern pager: [phone], text pages welcome

## 2021-10-20 NOTE — Progress Notes (Signed)
FPTS Brief Progress Note  S: Rounded with night RN, no issues raised.  Says patient's urine has been more clear than before with less sediment and has been producing more.   O: BP 131/74 (BP Location: Right Arm)   Pulse 88   Temp 97.6 F (36.4 C) (Oral)   Resp 16   Ht 5\' 2"  (1.575 m)   Wt (!) 154.2 kg   SpO2 97%   BMI 62.18 kg/m    GEN: Asleep, laying in bed comfortably   A/P: Left groin abscess s/p I&D Acute renal failure Urine output greatly improved at 2.075 liters charted so far.  Urine appeared to be a more normal color with less sediment.  Got a single dose of furosemide 120 mg IV yesterday which was switched yesterday. -Following up creatinine tomorrow -Strict I's and O's - Orders reviewed. Labs for AM ordered, which was adjusted as needed.   Gerrit Heck, MD 10/20/2021, 1:12 AM PGY-1, [facility] Night Resident  Please page [phone] with questions.

## 2021-10-20 NOTE — Progress Notes (Signed)
FPTS Brief Progress Note  S:Patient's room dark, she appears to be asleep. Spoke with RN. RN reports no BM, offered enema but patient declined. Patient also told RN "her spirit is hurting", RN believes patient looks to have a sad affect tonight. RN placed a chaplain consult for tomorrow AM.   O: BP 126/70 (BP Location: Right Arm)   Pulse 96   Temp 97.8 F (36.6 C) (Oral)   Resp 12   Ht 5\' 2"  (1.575 m)   Wt (!) 154.2 kg   SpO2 100%   BMI 62.18 kg/m    A/P:  Left groin abscess s/p I&D, POD #4 with penrose drain Tylenol 630 mg scheduled every 6 however patient has refused both doses today (at 1243 and 1724).  Gabapentin 600 mg twice daily, given this morning and next due at 2200.  Percocet 7.5/325 ordered every 4 as needed, received last this morning at 0 613.   Low mood RN placed chaplain consult for tomorrow morning.   Acute renal failure on CKD Stage 3a Improving.  Dayshift recorded UOP of 3050 L.   T2DM Afternoon CBG 243, received 13 units insulin aspart.  First increased dose of insulin Semglee (75 units) will be tonight.   HTN Patient normotensive this afternoon.  Continue holding home metoprolol.  Constipation Last reported bowel movement was Thursday.  Day team increase MiraLAX to twice daily. - Add senna daily - Add soapsuds enema as needed x3   - Orders reviewed. Labs for AM ordered, which was adjusted as needed.  - If condition changes, plan includes broadening antibiotics, increasing pain regimen.    Ezequiel Essex, MD 10/20/2021, 11:04 PM PGY-2, [place] Night Resident  Please page [phone] with questions.

## 2021-10-20 NOTE — Telephone Encounter (Signed)
Please see below message from [employer].   Thank you! I'll see if we need anything else.   Talbot Grumbling, RN

## 2021-10-20 NOTE — Progress Notes (Signed)
Assumed patient care at 1400hrs.  Patient agreed to take 11 units of sq insulin.  PT here for wound care to right groin.  Patient agreed to wound care.

## 2021-10-20 NOTE — Progress Notes (Signed)
PT Cancellation Note  Patient Details Name: [name] MRN: [id number] DOB: 1976/03/05   Cancelled Treatment:    Reason Eval/Treat Not Completed: Other (comment) Pt was sleeping but aroused to verbal stimuli.  She declined PT due to wanting bath first b/c she doesn't want to put her prosthesis on until she takes a bath.  Informed pt that therapist afternoon is full and wanted to see if she could get bath now and therapist come back in an hour.  Stated she does not want bath until noon. Informed would be back if able but if not would be tomorrow.  Abran Richard, PT Acute Rehab Services Pager [phone] [facility] [place] 10/20/2021, 11:03 AM

## 2021-10-20 NOTE — Progress Notes (Signed)
Interdry changed/replaced between abd folds. Jessie Foot, RN

## 2021-10-20 NOTE — Progress Notes (Addendum)
Physical Therapy Treatment Patient Details Name: [name] MRN: [id number] DOB: 10-26-1976 Today's Date: 10/20/2021   History of Present Illness 45 y.o. female who presented 10/16/21 with left groin abscess, hyperglycemia, and chest pain. S/p I&D of L inguinal abscess. PMH: type 2 diabetes, morbid obesity, obesity hypoventilation syndrome, OSA wears BiPAP at night, hypertension, asthma, GERD, hyperlipidemia, restless leg syndrome, left BKA, R transmetatarsal amputation, mood disorder, hypertension, history of chest pain    PT Comments    Pt does have prosthetic and boot today.  She was able to ambulate a few steps in room but limited by lethargy and unsteadiness. Requiring increased cues and time to don prosthetic and perform transfers.  Continue to progress as able. Recommend use of bariatric RW.     Recommendations for follow up therapy are one component of a multi-disciplinary discharge planning process, led by the attending physician.  Recommendations may be updated based on patient status, additional functional criteria and insurance authorization.  Follow Up Recommendations  Home health PT     Assistance Recommended at Discharge Frequent or constant Supervision/Assistance  Equipment Recommendations  None recommended by PT    Recommendations for Other Services       Precautions / Restrictions Precautions Precautions: Fall Precaution Comments: prior R transmet amputation - wears boot on R foot ; L BKA with prosthetic Restrictions    Mobility  Bed Mobility Overal bed mobility: Needs Assistance Bed Mobility: Supine to Sit     Supine to sit: Min assist     General bed mobility comments: Min A to pull upright    Transfers Overall transfer level: Needs assistance Equipment used: Rolling walker (2 wheels) Transfers: Sit to/from Stand Sit to Stand: Min assist;From elevated surface           General transfer comment: Pt with use of momentum but was able to stand  with min A and cues for safety    Ambulation/Gait Ambulation/Gait assistance: Min assist Gait Distance (Feet): 4 Feet Assistive device: Rolling walker (2 wheels) Gait Pattern/deviations: Step-to pattern;Decreased stride length;Wide base of support Gait velocity: decreased     General Gait Details: Unsteady requiring min A; Felt unsteady with regular RW - recommend bring bariatric RW; limited distances due to unsteadiness and lethargy   Stairs             Wheelchair Mobility    Modified Rankin (Stroke Patients Only)       Balance Overall balance assessment: Needs assistance Sitting-balance support: No upper extremity supported;Feet supported Sitting balance-Leahy Scale: Good     Standing balance support: Bilateral upper extremity supported Standing balance-Leahy Scale: Poor Standing balance comment: reliant on UE support                            Cognition Arousal/Alertness: Lethargic Behavior During Therapy: WFL for tasks assessed/performed Overall Cognitive Status: No family/caregiver present to determine baseline cognitive functioning Area of Impairment: Problem solving                             Problem Solving: Slow processing General Comments: Pt very lethargic and at times had difficulty thinking of correct words, repeating self, finishing sentences - likely due to lethargy        Exercises      General Comments General comments (skin integrity, edema, etc.): VSS on 2 L.  Increased time and mod A to don prosthetic and boot  Pertinent Vitals/Pain Pain Assessment: No/denies pain    Home Living Family/patient expects to be discharged to:: Private residence                        Prior Function            PT Goals (current goals can now be found in the care plan section) Progress towards PT goals: Progressing toward goals    Frequency    Min 3X/week      PT Plan Current plan remains appropriate     Co-evaluation              AM-PAC PT "6 Clicks" Mobility   Outcome Measure  Help needed turning from your back to your side while in a flat bed without using bedrails?: A Little Help needed moving from lying on your back to sitting on the side of a flat bed without using bedrails?: A Little Help needed moving to and from a bed to a chair (including a wheelchair)?: A Little Help needed standing up from a chair using your arms (e.g., wheelchair or bedside chair)?: A Little Help needed to walk in hospital room?: A Lot Help needed climbing 3-5 steps with a railing? : Total 6 Click Score: 15    End of Session Equipment Utilized During Treatment: Oxygen;Gait belt Activity Tolerance: Patient tolerated treatment well Patient left: in chair;with call bell/phone within reach (pt had initially reported did not want to stay in chair so did not place alarm pad, once up and walking decided to stay in chair, she is following commands and reports she would absolutely not get up on her own) Nurse Communication: Mobility status PT Visit Diagnosis: Muscle weakness (generalized) (M62.81);Difficulty in walking, not elsewhere classified (R26.2);Pain     Time: 1450-1530 PT Time Calculation (min) (ACUTE ONLY): 40 min  Charges:  $Gait Training: 8-22 mins $Therapeutic Activity: 23-37 mins                     Abran Richard, PT Acute Rehab Services Pager [phone] [facility] [place] 10/20/2021, 3:43 PM

## 2021-10-20 NOTE — Progress Notes (Signed)
Physical Therapy Wound Treatment Patient Details  Name: [name] MRN: [id number] Date of Birth: 27-Feb-1976  Today's Date: 10/20/2021 Time: 3383-2919 Time Calculation (min): 60 min  Subjective  Subjective Assessment Subjective: Upset initially however generally pleasant throughout session Patient and Family Stated Goals: Heal wounds Date of Onset:  (Unknown) Prior Treatments:  (I&D 11/11)  Pain Score:  Pt painful at times but overall tolerated treatment well.   Wound Assessment  Wound / Incision (Open or Dehisced) 10/20/21 Incision - Open Thigh Anterior;Proximal;Left Groin (Active)  Wound Image   10/20/21 1500  Dressing Type ABD;Gauze (Comment);Barrier Film (skin prep);Moist to moist 10/20/21 1500  Dressing Changed Changed 10/20/21 1500  Dressing Status Clean;Dry;Intact 10/20/21 1500  Dressing Change Frequency Daily 10/20/21 1500  Site / Wound Assessment Red;Yellow 10/20/21 1500  % Wound base Red or Granulating 50% 10/20/21 1500  % Wound base Yellow/Fibrinous Exudate 50% 10/20/21 1500  % Wound base Black/Eschar 0% 10/20/21 1500  % Wound base Other/Granulation Tissue (Comment) 0% 10/20/21 1500  Peri-wound Assessment Intact;Pink;Induration 10/20/21 1500  Wound Length (cm) 7.5 cm 10/20/21 1500  Wound Width (cm) 5 cm 10/20/21 1500  Wound Surface Area (cm^2) 37.5 cm^2 10/20/21 1500  Tunneling (cm) Wound tracks around with drain present and exiting out of both incision sites. Difficult to measure depth due to drains present 10/20/21 1500  Margins Unattached edges (unapproximated) 10/20/21 1500  Closure None 10/20/21 1500  Drainage Amount Moderate 10/20/21 1500  Drainage Description Purulent 10/20/21 1500  Treatment Debridement (Selective);Hydrotherapy (Pulse lavage);Packing (Saline gauze) 10/20/21 1500      Hydrotherapy Pulsed lavage therapy - wound location: L groin Pulsed Lavage with Suction (psi): 4 psi Pulsed Lavage with Suction - Normal Saline Used: 1000 mL Pulsed Lavage  Tip: Tip with splash shield Selective Debridement Selective Debridement - Location: L groin Selective Debridement - Tools Used: Forceps Selective Debridement - Tissue Removed: yellow slough    Wound Assessment and Plan  Wound Therapy - Assess/Plan/Recommendations Wound Therapy - Clinical Statement: Pt presents to hydrotherapy s/p L inguinal I&D. Slough and purulence noted around both incision sites. This patient will benefit from continued hydrotherapy for selective removal of unviable tissue, to decrease bioburden, and promote wound bed healing. 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 Factors Delaying/Impairing Wound Healing: Infection - systemic/local, Diabetes Mellitus Hydrotherapy Plan: Debridement, Dressing change, Patient/family education, Pulsatile lavage with suction Wound Therapy - Frequency: 6X / week Wound Therapy - Follow Up Recommendations: dressing changes by RN  Wound Therapy Goals- Improve the function of patient's integumentary system by progressing the wound(s) through the phases of wound healing (inflammation - proliferation - remodeling) by: Wound Therapy Goals - Improve the function of patient's integumentary system by progressing the wound(s) through the phases of wound healing by: Decrease Necrotic Tissue to: 20% Decrease Necrotic Tissue - Progress: Goal set today Increase Granulation Tissue to: 80% Increase Granulation Tissue - Progress: Goal set today Goals/treatment plan/discharge plan were made with and agreed upon by patient/family: Yes Time For Goal Achievement: 7 days Wound Therapy - Potential for Goals: Good  Goals will be updated until maximal potential achieved or discharge criteria met.  Discharge criteria: when goals achieved, discharge from hospital, MD decision/surgical intervention, no progress towards goals, refusal/missing three consecutive treatments without notification or medical  reason.  GP     Charges PT Wound Care Charges $Wound Debridement up to 20 cm: < or equal to 20 cm $PT Hydrotherapy Dressing: 2 dressings $PT PLS Gun and  Tip: 1 Supply $PT Hydrotherapy Visit: 1 Visit       [name] 10/20/2021, 3:33 PM  Rolinda Roan, PT, DPT Acute Rehabilitation Services Pager: [phone] Office: [phone]

## 2021-10-20 NOTE — Progress Notes (Signed)
Fasting CBG 251 this am .Jessie Foot, RN

## 2021-10-21 DIAGNOSIS — G4733 Obstructive sleep apnea (adult) (pediatric): Secondary | ICD-10-CM | POA: Diagnosis not present

## 2021-10-21 DIAGNOSIS — L02214 Cutaneous abscess of groin: Secondary | ICD-10-CM | POA: Diagnosis not present

## 2021-10-21 DIAGNOSIS — E1165 Type 2 diabetes mellitus with hyperglycemia: Secondary | ICD-10-CM | POA: Diagnosis not present

## 2021-10-21 DIAGNOSIS — N1831 Chronic kidney disease, stage 3a: Secondary | ICD-10-CM

## 2021-10-21 DIAGNOSIS — N179 Acute kidney failure, unspecified: Secondary | ICD-10-CM | POA: Diagnosis not present

## 2021-10-21 DIAGNOSIS — R4182 Altered mental status, unspecified: Secondary | ICD-10-CM

## 2021-10-21 LAB — CBC
HCT: 28.9 % — ABNORMAL LOW (ref 36.0–46.0)
Hemoglobin: 9.3 g/dL — ABNORMAL LOW (ref 12.0–15.0)
MCH: 30.2 pg (ref 26.0–34.0)
MCHC: 32.2 g/dL (ref 30.0–36.0)
MCV: 93.8 fL (ref 80.0–100.0)
Platelets: 433 10*3/uL — ABNORMAL HIGH (ref 150–400)
RBC: 3.08 MIL/uL — ABNORMAL LOW (ref 3.87–5.11)
RDW: 14.2 % (ref 11.5–15.5)
WBC: 9.6 10*3/uL (ref 4.0–10.5)
nRBC: 0 % (ref 0.0–0.2)

## 2021-10-21 LAB — MAGNESIUM: Magnesium: 1.7 mg/dL (ref 1.7–2.4)

## 2021-10-21 LAB — BLOOD GAS, VENOUS
Acid-base deficit: 5.6 mmol/L — ABNORMAL HIGH (ref 0.0–2.0)
Bicarbonate: 20.1 mmol/L (ref 20.0–28.0)
FIO2: 32
O2 Saturation: 96.4 %
Patient temperature: 36.5
pCO2, Ven: 44.3 mmHg (ref 44.0–60.0)
pH, Ven: 7.276 (ref 7.250–7.430)
pO2, Ven: 83.5 mmHg — ABNORMAL HIGH (ref 32.0–45.0)

## 2021-10-21 LAB — GLUCOSE, CAPILLARY
Glucose-Capillary: 243 mg/dL — ABNORMAL HIGH (ref 70–99)
Glucose-Capillary: 261 mg/dL — ABNORMAL HIGH (ref 70–99)
Glucose-Capillary: 143 mg/dL — ABNORMAL HIGH (ref 70–99)
Glucose-Capillary: 82 mg/dL (ref 70–99)

## 2021-10-21 LAB — BASIC METABOLIC PANEL
Anion gap: 12 (ref 5–15)
BUN: 56 mg/dL — ABNORMAL HIGH (ref 6–20)
CO2: 20 mmol/L — ABNORMAL LOW (ref 22–32)
Calcium: 8.1 mg/dL — ABNORMAL LOW (ref 8.9–10.3)
Chloride: 98 mmol/L (ref 98–111)
Creatinine, Ser: 5.17 mg/dL — ABNORMAL HIGH (ref 0.44–1.00)
GFR, Estimated: 10 mL/min — ABNORMAL LOW (ref >60)
Glucose, Bld: 260 mg/dL — ABNORMAL HIGH (ref 70–99)
Potassium: 4.1 mmol/L (ref 3.5–5.1)
Sodium: 130 mmol/L — ABNORMAL LOW (ref 135–145)

## 2021-10-21 LAB — AMMONIA: Ammonia: 21 umol/L (ref 9–35)

## 2021-10-21 MED ORDER — DOXYCYCLINE HYCLATE 100 MG PO TABS
100.0000 mg | ORAL_TABLET | Freq: Two times a day (BID) | ORAL | Status: DC
  Administered 2021-10-21 – 2021-10-25 (×8): 100 mg via ORAL
  Filled 2021-10-21 (×8): qty 1

## 2021-10-21 MED ORDER — FUROSEMIDE 10 MG/ML IJ SOLN
120.0000 mg | Freq: Every day | INTRAMUSCULAR | Status: DC
Start: 2021-10-21 — End: 2021-10-23
  Administered 2021-10-21 – 2021-10-23 (×3): 120 mg via INTRAVENOUS
  Filled 2021-10-21: qty 2
  Filled 2021-10-21: qty 10
  Filled 2021-10-21: qty 2

## 2021-10-21 MED ORDER — POLYETHYLENE GLYCOL 3350 17 G PO PACK
17.0000 g | PACK | Freq: Two times a day (BID) | ORAL | Status: DC
  Administered 2021-10-21 (×2): 17 g via ORAL
  Filled 2021-10-21 (×3): qty 1

## 2021-10-21 MED ORDER — AMOXICILLIN-POT CLAVULANATE 500-125 MG PO TABS
1.0000 | ORAL_TABLET | Freq: Every day | ORAL | Status: DC
  Administered 2021-10-21 – 2021-10-23 (×3): 500 mg via ORAL
  Filled 2021-10-21 (×3): qty 1

## 2021-10-21 MED ORDER — HYDROMORPHONE HCL 1 MG/ML IJ SOLN
1.0000 mg | Freq: Every day | INTRAMUSCULAR | Status: DC | PRN
  Administered 2021-10-21 – 2021-10-24 (×4): 1 mg via INTRAVENOUS
  Filled 2021-10-21 (×4): qty 1

## 2021-10-21 MED ORDER — ONDANSETRON HCL 4 MG/2ML IJ SOLN
4.0000 mg | Freq: Once | INTRAMUSCULAR | Status: DC
  Filled 2021-10-21: qty 2

## 2021-10-21 NOTE — Progress Notes (Signed)
Family Medicine Teaching Service Daily Progress Note Intern Pager: [phone]  Patient name: [name] Medical record number: [id number] Date of birth: 04-13-1976 Age: 45 y.o. Gender: female  Primary Care Provider: [name], MD Consultants: Neprhology, Surgery Code Status: Full  Pt Overview and Major Events to Date:  11/11-admitted, I&D 11/13-nephrology consulted  Assessment and Plan: [name] is a 45 y/o F who presented with left groin abscess, now s/p I&D, and onset of acute renal failure now receiving IV diuresis.  Left groin abscess s/p I&D, POD#5 with penrose drain Pain is still present when pannus is moved to evaluate drain site. No purulent drainage noted.  - Discontinued rocephin, transition to oral augmentin - Transition IV doxycycline to oral - Continue Tylenol 650mg  q6h - Continue to receive hydrotherapy - Oxycodone and gabapentin stopped for the time being - Ordered once daily prn Dilaudid 1mg  IV prior to hydrotherapy for specific pain relief as previous pain medication stopped due to new onset altered mental status.  Also aware that pain medication will need to be not renally excreted given AKI on CKD  Acute renal failure on CKD3a; no longer anuric Total UOP 4.4L. Scr today is 5.17, down from 5.62 yesterday. Na improved to 130, from 126 yesterday. Per nephrology: still continuing to monitor SCr and they have discussed possibility of dialysis in the future.  - Lasix dosing changed to 80mg  BID by nephrology - Nephrology following, appreciate recommendations  New onset altered mental status of unknown cause Patient's labs have notably improved.  She is aware of who she is and where she is but unaware of why she is admitted to the hospital.  She appears altered compared to baseline.  Spoke with RN who states she was signed out as A&O x4.  RN also notes that patient's mother concurs that over the phone patient does not seem like her normal self. Unlikely to be  infectious given normal white count and presently being treated on antibiotics.  Capable of moving all extremities and following commands, unlikely to be stroke or seizure related.  Potentially related to medication use, will further explore.  VBG and ammonia level within normal limits. - Hold gabapentin and oxycodone at this time - Continue to monitor, as drug-induced AMS may persist while medications still in system  T2DM HgbA1c 15.0% -continue Semglee 75u Bid, novolog 0-20u TID w/ meals and 0-5 HX + 6u TID -appreciate Diabetes coordinator recommendations  Chronic hypercapneic and hypoxemic respiratory failure, OHS/OHA Chronic, stable.  Currently on room air with normal work of breathing.  Patient used CPAP overnight.  Has not received Dulera or Incruse Ellipta. -Continue CPAP at night and Dulera and Incruse Ellipta as needed -Encourage OOB  HTN Holding home meds (Toprol-XL 25 mg daily) -Continue to monitor  Constipation No BM since 6 days ago.  Bowel sounds present.  Patient states she has urge.  Very adamantly against enema. -Senna daily and MiraLAX twice daily -Consider enema if no bowel movement today  Other conditions (HLD, GERD, OSA) chronic and stable  FEN/GI: Carb modified PPx: SCDs Dispo:Home pending clinical improvement . Barriers include abx, UOP monitoring, nephrology recs for kidney dysfxn.   Subjective:  Patient is unaware of reason why she was admitted to the hospital.  Denies pain but notes pain when pannus is moved to allow for evaluation of groin abscess drain.  Patient notes she has urge to poop.  Objective: Temp:  [97.5 F (36.4 C)-98.4 F (36.9 C)] 97.5 F (36.4 C) (11/16 0631) Pulse  Rate:  [91-96] 91 (11/16 0631) Resp:  [12-17] 17 (11/16 0631) BP: (102-136)/(58-93) 102/58 (11/16 0631) SpO2:  [96 %-100 %] 98 % (11/16 0631) Physical Exam: General: Capable in conversation but not appearing fully oriented Cardiovascular: RRR, no murmurs  auscultated Respiratory: Difficult to fully appreciate lung sounds given body habitus, no increased work of breathing Abdomen: Severely obese, bowel sounds present in all 4 quadrants, notably in pain when pannus is relieved, no drainage or bleeding noted over abscess bandage Extremities: Left BKA  Laboratory: Recent Labs  Lab 10/18/21 0833 10/19/21 0625 10/21/21 0606  WBC 13.4* 12.1* 9.6  HGB 9.8* 9.7* 9.3*  HCT 29.1* 30.3* 28.9*  PLT 334 372 433*   Recent Labs  Lab 10/18/21 0833 10/18/21 1223 10/19/21 0145 10/19/21 1230 10/20/21 0430  NA 127*   < > 123* 125* 126*  K 3.6   < > 3.9 4.2 4.1  CL 92*   < > 93* 92* 94*  CO2 21*   < > 17* 19* 18*  BUN 38*   < > 43* 45* 49*  CREATININE 4.50*   < > 4.99* 5.59* 5.62*  CALCIUM 7.6*   < > 7.3* 7.7* 7.7*  PROT 7.1  --   --   --   --   BILITOT 0.4  --   --   --   --   ALKPHOS 187*  --   --   --   --   ALT 12  --   --   --   --   AST 13*  --   --   --   --   GLUCOSE 243*   < > 330* 325* 247*   < > = values in this interval not displayed.   Ammonia level: 21  Imaging/Diagnostic Tests: No results found.   Belinda Guiles, DO 10/21/2021, 7:51 AM PGY-1, [place] Intern pager: [phone], text pages welcome

## 2021-10-21 NOTE — Plan of Care (Signed)
  Problem: Health Behavior/Discharge Planning: Goal: Ability to manage health-related needs will improve Outcome: Progressing   Problem: Clinical Measurements: Goal: Ability to maintain clinical measurements within normal limits will improve Outcome: Progressing   Problem: Clinical Measurements: Goal: Diagnostic test results will improve Outcome: Progressing   Problem: Clinical Measurements: Goal: Respiratory complications will improve Outcome: Progressing   Problem: Clinical Measurements: Goal: Cardiovascular complication will be avoided Outcome: Progressing   Problem: Activity: Goal: Risk for activity intolerance will decrease Outcome: Progressing   Problem: Nutrition: Goal: Adequate nutrition will be maintained Outcome: Progressing   Problem: Coping: Goal: Level of anxiety will decrease Outcome: Progressing   Problem: Elimination: Goal: Will not experience complications related to bowel motility Outcome: Progressing   Problem: Pain Managment: Goal: General experience of comfort will improve Outcome: Progressing   Problem: Safety: Goal: Ability to remain free from injury will improve Outcome: Progressing   Problem: Skin Integrity: Goal: Risk for impaired skin integrity will decrease Outcome: Progressing

## 2021-10-21 NOTE — Progress Notes (Signed)
Progress Note  5 Days Post-Op  Subjective: She is very drowsy and somewhat confused this am. She is oriented to self and place but is very slow in responding to questions and asks they be repeated over and over. She replies "November" when asked both which month and which year it is. During exam she has significant pain with manipulation of pannus and subsequent episode of brown NBNB emesis.  Medicine team physician was also present for part of my visit.  Objective: Vital signs in last 24 hours: Temp:  [97.5 F (36.4 C)-98.4 F (36.9 C)] 97.6 F (36.4 C) (11/16 0854) Pulse Rate:  [88-96] 88 (11/16 0854) Resp:  [12-17] 12 (11/16 0854) BP: (102-143)/(58-90) 143/90 (11/16 0854) SpO2:  [95 %-100 %] 95 % (11/16 0854) Last BM Date: 10/15/21  Intake/Output from previous day: 11/15 0701 - 11/16 0700 In: 9179 [P.O.:1080; IV Piggyback:574] Out: 6000 [Urine:4400; Drains:1600] Intake/Output this shift: No intake/output data recorded.  PE: General: sitting up in bed in NAD. Drowsy skin: Mons pannus erythematous but remains less indurated. Wound open with penrose in place with some purulent sanguinous at wound and on ABD. Surrounding skin mildly macerated Psych: alert but confused. Drowsy. Oriented to self and place. Not oriented to year.    Lab Results:  Recent Labs    10/19/21 0625 10/21/21 0606  WBC 12.1* 9.6  HGB 9.7* 9.3*  HCT 30.3* 28.9*  PLT 372 433*    BMET Recent Labs    10/20/21 0430 10/21/21 0606  NA 126* 130*  K 4.1 4.1  CL 94* 98  CO2 18* 20*  GLUCOSE 247* 260*  BUN 49* 56*  CREATININE 5.62* 5.17*  CALCIUM 7.7* 8.1*    PT/INR No results for input(s): LABPROT, INR in the last 72 hours. CMP     Component Value Date/Time   NA 130 (L) 10/21/2021 0606   NA 139 04/24/2021 1119   K 4.1 10/21/2021 0606   CL 98 10/21/2021 0606   CO2 20 (L) 10/21/2021 0606   GLUCOSE 260 (H) 10/21/2021 0606   BUN 56 (H) 10/21/2021 0606   BUN 49 (H) 04/24/2021 1119    CREATININE 5.17 (H) 10/21/2021 0606   CREATININE 0.51 08/05/2015 1114   CALCIUM 8.1 (L) 10/21/2021 0606   PROT 7.1 10/18/2021 0833   ALBUMIN 1.9 (L) 10/18/2021 0833   AST 13 (L) 10/18/2021 0833   ALT 12 10/18/2021 0833   ALKPHOS 187 (H) 10/18/2021 0833   BILITOT 0.4 10/18/2021 0833   GFRNONAA 10 (L) 10/21/2021 0606   GFRAA 77 12/09/2020 1605   Lipase     Component Value Date/Time   LIPASE 56 (H) 11/01/2020 1720       Studies/Results: No results found.  Anti-infectives: Anti-infectives (From admission, onward)    Start     Dose/Rate Route Frequency Ordered Stop   10/17/21 1245  doxycycline (VIBRAMYCIN) 100 mg in sodium chloride 0.9 % 250 mL IVPB        100 mg 125 mL/hr over 120 Minutes Intravenous Every 12 hours 10/17/21 1157     10/16/21 2230  cefTRIAXone (ROCEPHIN) 2 g in sodium chloride 0.9 % 100 mL IVPB        2 g 200 mL/hr over 30 Minutes Intravenous Every 24 hours 10/16/21 2143     10/16/21 2230  metroNIDAZOLE (FLAGYL) IVPB 500 mg  Status:  Discontinued        500 mg 100 mL/hr over 60 Minutes Intravenous Every 12 hours 10/16/21 2143 10/17/21 1156  10/16/21 1345  piperacillin-tazobactam (ZOSYN) IVPB 3.375 g        3.375 g 100 mL/hr over 30 Minutes Intravenous  Once 10/16/21 1341 10/16/21 1534   10/16/21 1215  vancomycin (VANCOCIN) 2,500 mg in sodium chloride 0.9 % 500 mL IVPB        2,500 mg 250 mL/hr over 120 Minutes Intravenous  Once 10/16/21 1201 10/16/21 1438        Assessment/Plan POD 4, s/p I&D for L groin abscess - penrose drain in place with ABD to cover -WBC normalized to 9.6, afebrile -cx with moderate prevotella bivia - beta lactamase positive. Continue abx - continue hydrotherapy to wash this area out as she is unable to get in the shower here due to her BKA, etc. Given pain with exam today and AMS - may need to premedicate - patient back on 7.5-325 percocet q4h prn for pain control with 2.5 mg oxycodone q4h prn for more severe pain, IV dilaudid  0.5 mg q4h prn for breakthrough pain  - new onset nausea/emesis and AMS - N/V may have been related to pain episode - RN updated for antiemetics. Primary team present at exam and aware of AMS. 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

## 2021-10-21 NOTE — Progress Notes (Signed)
Patient ID: [name], female   DOB: 1976/02/16, 45 y.o.   MRN: [id number] S: No new complaints.  Per nursing, she has been depressed about being in the hospital. O:BP (!) 143/90 (BP Location: Left Arm)   Pulse 88   Temp 97.6 F (36.4 C) (Axillary)   Resp 12   Ht '5\' 2"'  (4.098 m)   Wt (!) 154.2 kg   SpO2 95%   BMI 62.18 kg/m   Intake/Output Summary (Last 24 hours) at 10/21/2021 1142 Last data filed at 10/21/2021 0631 Gross per 24 hour  Intake 1414 ml  Output 6000 ml  Net -4586 ml   Intake/Output: I/O last 3 completed shifts: In: 2210.2 [P.O.:1320; IV Piggyback:890.2] Out: 8100 [Urine:6500; Drains:1600]  Intake/Output this shift:  No intake/output data recorded. Weight change:  Gen: NAD CVS: RRR Resp:CTA Abd: obese, +BS, soft, NT/ND Ext: no edema  Recent Labs  Lab 10/18/21 0833 10/18/21 1223 10/18/21 1806 10/19/21 0145 10/19/21 1230 10/20/21 0430 10/21/21 0606  NA 127* 127* 125* 123* 125* 126* 130*  K 3.6 4.2 4.0 3.9 4.2 4.1 4.1  CL 92* 94* 90* 93* 92* 94* 98  CO2 21* 21* 19* 17* 19* 18* 20*  GLUCOSE 243* 281* 247* 330* 325* 247* 260*  BUN 38* 38* 38* 43* 45* 49* 56*  CREATININE 4.50* 4.62* 4.55* 4.99* 5.59* 5.62* 5.17*  ALBUMIN 1.9*  --   --   --   --   --   --   CALCIUM 7.6* 7.9* 7.9* 7.3* 7.7* 7.7* 8.1*  AST 13*  --   --   --   --   --   --   ALT 12  --   --   --   --   --   --    Liver Function Tests: Recent Labs  Lab 10/18/21 0833  AST 13*  ALT 12  ALKPHOS 187*  BILITOT 0.4  PROT 7.1  ALBUMIN 1.9*   No results for input(s): LIPASE, AMYLASE in the last 168 hours. No results for input(s): AMMONIA in the last 168 hours. CBC: Recent Labs  Lab 10/16/21 2029 10/17/21 0355 10/18/21 0833 10/19/21 0625 10/21/21 0606  WBC 17.8* 16.9* 13.4* 12.1* 9.6  HGB 11.0* 10.8* 9.8* 9.7* 9.3*  HCT 32.9* 32.9* 29.1* 30.3* 28.9*  MCV 92.2 93.7 93.3 95.6 93.8  PLT 315 322 334 372 433*   Cardiac Enzymes: No results for input(s): CKTOTAL, CKMB, CKMBINDEX,  TROPONINI in the last 168 hours. CBG: Recent Labs  Lab 10/20/21 1156 10/20/21 1652 10/20/21 2153 10/21/21 0810 10/21/21 1133  GLUCAP 275* 243* 242* 243* 261*    Iron Studies: No results for input(s): IRON, TIBC, TRANSFERRIN, FERRITIN in the last 72 hours. Studies/Results: No results found.  acetaminophen  650 mg Oral Q6H   Or   acetaminophen  650 mg Rectal Q6H   amoxicillin-clavulanate  1 tablet Oral Daily   atorvastatin  80 mg Oral q1800   chlorhexidine  15 mL Mouth/Throat Once   Or   mouth rinse  15 mL Mouth Rinse Once   Chlorhexidine Gluconate Cloth  6 each Topical Daily   ciprofloxacin-dexamethasone  4 drop Right EAR BID   doxycycline  100 mg Oral Q12H   ferrous sulfate  325 mg Oral QODAY   insulin aspart  0-20 Units Subcutaneous TID WC   insulin aspart  0-5 Units Subcutaneous QHS   insulin aspart  6 Units Subcutaneous TID WC   insulin glargine-yfgn  75 Units Subcutaneous BID   nystatin  Topical BID   pantoprazole  40 mg Oral QHS   polyethylene glycol  17 g Oral BID   senna  1 tablet Oral Daily    BMET    Component Value Date/Time   NA 130 (L) 10/21/2021 0606   NA 139 04/24/2021 1119   K 4.1 10/21/2021 0606   CL 98 10/21/2021 0606   CO2 20 (L) 10/21/2021 0606   GLUCOSE 260 (H) 10/21/2021 0606   BUN 56 (H) 10/21/2021 0606   BUN 49 (H) 04/24/2021 1119   CREATININE 5.17 (H) 10/21/2021 0606   CREATININE 0.51 08/05/2015 1114   CALCIUM 8.1 (L) 10/21/2021 0606   GFRNONAA 10 (L) 10/21/2021 0606   GFRAA 77 12/09/2020 1605   CBC    Component Value Date/Time   WBC 9.6 10/21/2021 0606   RBC 3.08 (L) 10/21/2021 0606   HGB 9.3 (L) 10/21/2021 0606   HGB 12.4 04/24/2021 1119   HCT 28.9 (L) 10/21/2021 0606   HCT 39.7 04/24/2021 1119   PLT 433 (H) 10/21/2021 0606   PLT 453 (H) 04/24/2021 1119   MCV 93.8 10/21/2021 0606   MCV 96 04/24/2021 1119   MCH 30.2 10/21/2021 0606   MCHC 32.2 10/21/2021 0606   RDW 14.2 10/21/2021 0606   RDW 13.6 04/24/2021 1119    LYMPHSABS 2.8 04/27/2021 0632   LYMPHSABS 4.5 (H) 09/19/2018 0928   MONOABS 1.0 04/27/2021 0632   EOSABS 0.2 04/27/2021 0632   EOSABS 0.2 09/19/2018 0928   BASOSABS 0.0 04/27/2021 0632   BASOSABS 0.1 09/19/2018 0928    Assessment/ Plan: AKI on CKD 3a - b/l creat from May 2022 is 1.02- 1.37, eGFR 49- >60 ml/min. Creat here 1.5 on admission 3 days ago and creat up to 4.1/ 4.6 today in setting of admission for L groin abscess, now sp I&D. Pt is vol overloaded, 9 L + in 2 days. UA looks infected, renal US w/o obstruction. No shock. AKI likely ATN due to contrast injury +/- IV abx (vanc) toxicity. Made urine yesterday but not much today.  Started on IV lasix 80 bid without much response but had marked diuresis after dose of 120 mg IV BID Scr now improving Decrease lasix to 120 mg IV daily due to vigorous diuresis with bid dosing.   No uremic symptoms or indication for urgent dialysis at this time. Will continue to follow.  L groin abscess - vanc/ zosyn/ flagyl dc'd, getting IV doxy/ Rocephin now.  PAD hx L BKA Morbid obesity DM2 on insulin HTN - BP's soft, holding home BP medications  Donetta Potts, MD [employer] [phone]

## 2021-10-21 NOTE — Progress Notes (Signed)
Physical Therapy Wound Treatment Patient Details  Name: [name] MRN: [id number] Date of Birth: [birth year]  Today's Date: 10/21/2021 Time:  -     Subjective  Subjective Assessment Subjective: I'm so sorry... Patient and Family Stated Goals: Heal wounds Date of Onset:  (Unknown) Prior Treatments:  (I&D 11/11)  Pain Score:  4/10 after dilaudid,  N/V after treatment   Wound Assessment     Wound / Incision (Open or Dehisced) 10/20/21 Incision - Open Thigh Anterior;Proximal;Left Groin (Active)  Wound Image   10/20/21 1500  Dressing Type ABD;Barrier Film (skin prep);Gauze (Comment);Moist to dry;Normal saline moist dressing 10/21/21 1521  Dressing Changed Changed 10/21/21 1521  Dressing Status Clean;Intact 10/21/21 1521  Dressing Change Frequency Daily 10/21/21 1521  Site / Wound Assessment Clean;Red;Yellow 10/21/21 1521  % Wound base Red or Granulating 50% 10/21/21 1521  % Wound base Yellow/Fibrinous Exudate 50% 10/21/21 1521  % Wound base Black/Eschar 0% 10/21/21 1521  % Wound base Other/Granulation Tissue (Comment) 0% 10/21/21 1521  Peri-wound Assessment Intact;Induration 10/21/21 1521  Wound Length (cm) 7.5 cm 10/20/21 1500  Wound Width (cm) 5 cm 10/20/21 1500  Wound Surface Area (cm^2) 37.5 cm^2 10/20/21 1500  Tunneling (cm) Wound tracks around with drain present and exiting out of both incision sites. Difficult to measure depth due to drains present 10/20/21 1500  Margins Other (Comment) 10/21/21 1521  Closure None 10/20/21 1500  Drainage Amount Moderate 10/21/21 1521  Drainage Description Purulent 10/21/21 1521  Treatment Cleansed;Debridement (Selective);Hydrotherapy (Pulse lavage);Packing (Saline gauze) 10/21/21 1521      Hydrotherapy Pulsed lavage therapy - wound location: L groin Pulsed Lavage with Suction (psi): 4 psi Pulsed Lavage with Suction - Normal Saline Used: 1000 mL Pulsed Lavage Tip: Tip with splash shield Selective Debridement Selective Debridement -  Location: L groin Selective Debridement - Tools Used: Forceps Selective Debridement - Tissue Removed: yellow slough    Wound Assessment and Plan  Wound Therapy - Assess/Plan/Recommendations Wound Therapy - Clinical Statement: Pt presents to hydrotherapy s/p L inguinal I&D. Slough and purulence noted around both incision sites. This patient will benefit from continued hydrotherapy for selective removal of unviable tissue, to decrease bioburden, and promote wound bed healing. 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 Factors Delaying/Impairing Wound Healing: Infection - systemic/local, Diabetes Mellitus Hydrotherapy Plan: Debridement, Dressing change, Patient/family education, Pulsatile lavage with suction Wound Therapy - Frequency: 6X / week Wound Therapy - Follow Up Recommendations: dressing changes by RN  Wound Therapy Goals- Improve the function of patient's integumentary system by progressing the wound(s) through the phases of wound healing (inflammation - proliferation - remodeling) by: Wound Therapy Goals - Improve the function of patient's integumentary system by progressing the wound(s) through the phases of wound healing by: Decrease Necrotic Tissue to: 20% Decrease Necrotic Tissue - Progress: Progressing toward goal Increase Granulation Tissue to: 80% Increase Granulation Tissue - Progress: Progressing toward goal Goals/treatment plan/discharge plan were made with and agreed upon by patient/family: Yes Time For Goal Achievement: 7 days Wound Therapy - Potential for Goals: Good  Goals will be updated until maximal potential achieved or discharge criteria met.  Discharge criteria: when goals achieved, discharge from hospital, MD decision/surgical intervention, no progress towards goals, refusal/missing three consecutive treatments without notification or medical reason.  GP     Charges PT Wound Care Charges $Wound  Debridement up to 20 cm: < or equal to 20 cm $ Wound Debridement each add'l 20 sqcm: 1 $PT Hydrotherapy Dressing:  1 dressing $PT PLS Gun and Tip: 1 Supply $PT Hydrotherapy Visit: 1 Visit       [name] Chidubem Chaires 10/21/2021, 3:26 PM 10/21/2021  Ginger Carne., PT Acute Rehabilitation Services [phone]  (pager) [phone]  (office)

## 2021-10-21 NOTE — Progress Notes (Addendum)
Upon entering room this evening for shift assessment, Pt with flat affect and not as interactive with me as she has been on my previous 2 shifts with her. I asked if she was ok-noting that she appeared sad. Pt referenced episodes earlier today with day shift RN,MD and Unit Leadership, she expressed she is tired of being in the hospital, she apologized for being "rude", she expressed she has a lot of stuff going on at home and stated "my soul is sad". I encouraged [name] to continue accepting the medical interventions needed to progress her towards discharge. I encouraged her to take things one day at a time and work on what needed to be accomplished for that day. I also asked if she would like to talk to the Chaplain which she accepted but wanted to wait till tomorrow because she was tired. Will place spiritual  consult for the am. Jessie Foot, RN

## 2021-10-21 NOTE — Progress Notes (Signed)
Physical Therapy Treatment Patient Details Name: [name] MRN: [id number] DOB: Apr 15, 1976 Today's Date: 10/21/2021   History of Present Illness 45 y.o. female who presented 10/16/21 with left groin abscess, hyperglycemia, and chest pain. S/p I&D of L inguinal abscess. PMH: type 2 diabetes, morbid obesity, obesity hypoventilation syndrome, OSA wears BiPAP at night, hypertension, asthma, GERD, hyperlipidemia, restless leg syndrome, left BKA, R transmetatarsal amputation, mood disorder, hypertension, history of chest pain    PT Comments    Session limited by nausea/vomiting/and generally feeling poorly after hydrotherapy session.  Plan was transition to EOB and work on standing/transfers, but limited due to N/V/ appearing syncopal.    Recommendations for follow up therapy are one component of a multi-disciplinary discharge planning process, led by the attending physician.  Recommendations may be updated based on patient status, additional functional criteria and insurance authorization.  Follow Up Recommendations  Home health PT     Assistance Recommended at Discharge Frequent or constant Supervision/Assistance  Equipment Recommendations  None recommended by PT    Recommendations for Other Services       Precautions / Restrictions Precautions Precautions: Fall Precaution Comments: prior R transmet amputation - wears boot on R foot ; L BKA with prosthetic Restrictions RLE Weight Bearing: Partial weight bearing     Mobility  Bed Mobility Overal bed mobility: Needs Assistance Bed Mobility: Supine to Sit;Sit to Supine     Supine to sit: Min assist Sit to supine: Min assist;+2 for physical assistance   General bed mobility comments: truncal assist up via R elbow st sitting EOB, no assist to scoot to EOB.  min A of 2 to control and assist return to supine    Transfers                   General transfer comment: unable, pt got nauseated and vomited at EOB, transfer  deferred.    Ambulation/Gait                   Stairs             Wheelchair Mobility    Modified Rankin (Stroke Patients Only)       Balance Overall balance assessment: Needs assistance Sitting-balance support: Single extremity supported;No upper extremity supported Sitting balance-Leahy Scale: Fair Sitting balance - Comments: UE's jerking R>L UE, pt was nauseous and appeared to be pre syncopal.  BP thought 138/73                                    Cognition Arousal/Alertness: Lethargic Behavior During Therapy: WFL for tasks assessed/performed (tearful) Overall Cognitive Status:  (NT formally)                                          Exercises      General Comments General comments (skin integrity, edema, etc.): vss on [place], pt appears pre syncopal at EOB with N/V green vomitus.  BP 138/73      Pertinent Vitals/Pain Pain Assessment: Faces Faces Pain Scale: Hurts even more Pain Location: groin Pain Descriptors / Indicators: Discomfort;Grimacing;Other (Comment) (nauseated) Pain Intervention(s): Monitored during session;Premedicated before session    Home Living                          Prior  Function            PT Goals (current goals can now be found in the care plan section) Acute Rehab PT Goals PT Goal Formulation: With patient Time For Goal Achievement: 11/01/21 Potential to Achieve Goals: Good Progress towards PT goals: Progressing toward goals    Frequency    Min 3X/week      PT Plan Current plan remains appropriate    Co-evaluation              AM-PAC PT "6 Clicks" Mobility   Outcome Measure  Help needed turning from your back to your side while in a flat bed without using bedrails?: A Little Help needed moving from lying on your back to sitting on the side of a flat bed without using bedrails?: A Little Help needed moving to and from a bed to a chair (including a wheelchair)?:  A Little Help needed standing up from a chair using your arms (e.g., wheelchair or bedside chair)?: A Little Help needed to walk in hospital room?: A Lot Help needed climbing 3-5 steps with a railing? : Total 6 Click Score: 15    End of Session Equipment Utilized During Treatment: Oxygen Activity Tolerance: Patient tolerated treatment well;Other (comment) (N/v) Patient left: in bed;with call bell/phone within reach;with bed alarm set Nurse Communication: Mobility status PT Visit Diagnosis: Muscle weakness (generalized) (M62.81);Difficulty in walking, not elsewhere classified (R26.2);Pain Pain - part of body:  (groin L)     Time: 4268-3419 PT Time Calculation (min) (ACUTE ONLY): 10 min  Charges:  $Therapeutic Activity: 8-22 mins                     10/21/2021  Belinda Carne., PT Acute Rehabilitation Services [phone]  (pager) [phone]  (office)   [name] 10/21/2021, 4:24 PM

## 2021-10-21 NOTE — Progress Notes (Signed)
Inpatient Diabetes Program Recommendations  AACE/ADA: New Consensus Statement on Inpatient Glycemic Control   Target Ranges:  Prepandial:   less than 140 mg/dL      Peak postprandial:   less than 180 mg/dL (1-2 hours)      Critically ill patients:  140 - 180 mg/dL   Results for KLOHE, LOVERING (MRN [id number]) as of 10/21/2021 08:29  Ref. Range 10/20/2021 08:04 10/20/2021 11:56 10/20/2021 16:52 10/20/2021 21:53 10/21/2021 08:10  Glucose-Capillary Latest Ref Range: 70 - 99 mg/dL 245 (H) 275 (H) 243 (H) 242 (H) 243 (H)   Review of Glycemic Control  Current orders for Inpatient glycemic control: Semglee 75 units BID, Novolog 0-20 units TID with meals, Novolog 0-5 units QHS, Novolog 6 units TID with meals  Inpatient Diabetes Program Recommendations:    Insulin: Noted Semglee increased yesterday. If post prandial glucose remains consistently over 180 mg/dl, please consider increasing meal coverage to Novolog 12 units TID with meals if patient eats at least 50% of meals.  Thanks, Barnie Alderman, RN, MSN, CDE Diabetes Coordinator Inpatient Diabetes Program [phone] (Team Pager from 8am to 5pm)

## 2021-10-21 NOTE — Progress Notes (Addendum)
PT Cancellation Note  Patient Details Name: [name] MRN: [id number] DOB: Apr 07, 1976   Cancelled Treatment:    Reason Eval/Treat Not Completed: Fatigue/lethargy limiting ability to participate. Pt opening eyes partially upon PT arrival but then closing as PT approached. Pt able to be aroused to open eyes briefly with light brushing of R arm, but pt then closing eyes quickly with L arm dropping off chest. Pt able to shake head and stated "nu uh" to question whether she would like to participate with PT at this time while her eyes remained closed. Notified RN of pt's lethargy. Will re-attempt later in day as time permits.   Addendum 14:31 - Attempted PT session again at 13:32, pt crying and stating "I don't want to do PT right now". Educated pt on importance of mobility to reduce risk of deconditioning while in hospital. Pt continuing to cry and state "I don't know what I want". Informed pt to contact RN if she decides she wants to participate in PT later today, coordinated with RN also.     Moishe Spice, PT, DPT Acute Rehabilitation Services  Pager: [phone] Office: [place] 10/21/2021, 10:15 AM

## 2021-10-22 ENCOUNTER — Inpatient Hospital Stay (HOSPITAL_COMMUNITY): Payer: Medicaid Other

## 2021-10-22 DIAGNOSIS — L02214 Cutaneous abscess of groin: Secondary | ICD-10-CM | POA: Diagnosis not present

## 2021-10-22 DIAGNOSIS — N179 Acute kidney failure, unspecified: Secondary | ICD-10-CM | POA: Diagnosis not present

## 2021-10-22 DIAGNOSIS — E1165 Type 2 diabetes mellitus with hyperglycemia: Secondary | ICD-10-CM | POA: Diagnosis not present

## 2021-10-22 DIAGNOSIS — L0291 Cutaneous abscess, unspecified: Secondary | ICD-10-CM | POA: Diagnosis not present

## 2021-10-22 LAB — BASIC METABOLIC PANEL
Anion gap: 12 (ref 5–15)
BUN: 56 mg/dL — ABNORMAL HIGH (ref 6–20)
CO2: 22 mmol/L (ref 22–32)
Calcium: 8.6 mg/dL — ABNORMAL LOW (ref 8.9–10.3)
Chloride: 100 mmol/L (ref 98–111)
Creatinine, Ser: 4.39 mg/dL — ABNORMAL HIGH (ref 0.44–1.00)
GFR, Estimated: 12 mL/min — ABNORMAL LOW (ref >60)
Glucose, Bld: 116 mg/dL — ABNORMAL HIGH (ref 70–99)
Potassium: 3.9 mmol/L (ref 3.5–5.1)
Sodium: 134 mmol/L — ABNORMAL LOW (ref 135–145)

## 2021-10-22 LAB — CBC
HCT: 32.1 % — ABNORMAL LOW (ref 36.0–46.0)
Hemoglobin: 10.3 g/dL — ABNORMAL LOW (ref 12.0–15.0)
MCH: 30.3 pg (ref 26.0–34.0)
MCHC: 32.1 g/dL (ref 30.0–36.0)
MCV: 94.4 fL (ref 80.0–100.0)
Platelets: 511 10*3/uL — ABNORMAL HIGH (ref 150–400)
RBC: 3.4 MIL/uL — ABNORMAL LOW (ref 3.87–5.11)
RDW: 14.6 % (ref 11.5–15.5)
WBC: 11.1 10*3/uL — ABNORMAL HIGH (ref 4.0–10.5)
nRBC: 0 % (ref 0.0–0.2)

## 2021-10-22 LAB — GLUCOSE, CAPILLARY
Glucose-Capillary: 108 mg/dL — ABNORMAL HIGH (ref 70–99)
Glucose-Capillary: 131 mg/dL — ABNORMAL HIGH (ref 70–99)
Glucose-Capillary: 144 mg/dL — ABNORMAL HIGH (ref 70–99)
Glucose-Capillary: 151 mg/dL — ABNORMAL HIGH (ref 70–99)
Glucose-Capillary: 217 mg/dL — ABNORMAL HIGH (ref 70–99)
Glucose-Capillary: 76 mg/dL (ref 70–99)

## 2021-10-22 LAB — MAGNESIUM: Magnesium: 1.8 mg/dL (ref 1.7–2.4)

## 2021-10-22 MED ORDER — INSULIN GLARGINE-YFGN 100 UNIT/ML ~~LOC~~ SOLN
30.0000 [IU] | Freq: Two times a day (BID) | SUBCUTANEOUS | Status: DC
  Administered 2021-10-22 – 2021-10-24 (×4): 30 [IU] via SUBCUTANEOUS
  Filled 2021-10-22 (×6): qty 0.3

## 2021-10-22 MED ORDER — GLYCERIN (LAXATIVE) 2.1 G RE SUPP
1.0000 | Freq: Once | RECTAL | Status: AC
  Administered 2021-10-22: 07:00:00 1 via RECTAL
  Filled 2021-10-22: qty 1

## 2021-10-22 MED ORDER — HEPARIN SODIUM (PORCINE) 5000 UNIT/ML IJ SOLN
5000.0000 [IU] | Freq: Three times a day (TID) | INTRAMUSCULAR | Status: DC
  Administered 2021-10-22 – 2021-10-25 (×9): 5000 [IU] via SUBCUTANEOUS
  Filled 2021-10-22 (×9): qty 1

## 2021-10-22 MED ORDER — INSULIN GLARGINE-YFGN 100 UNIT/ML ~~LOC~~ SOLN
60.0000 [IU] | Freq: Two times a day (BID) | SUBCUTANEOUS | Status: DC
  Filled 2021-10-22: qty 0.6

## 2021-10-22 MED ORDER — POLYETHYLENE GLYCOL 3350 17 G PO PACK
34.0000 g | PACK | Freq: Two times a day (BID) | ORAL | Status: DC
  Administered 2021-10-22 – 2021-10-24 (×3): 34 g via ORAL
  Filled 2021-10-22 (×4): qty 2

## 2021-10-22 MED ORDER — SENNA 8.6 MG PO TABS
1.0000 | ORAL_TABLET | Freq: Two times a day (BID) | ORAL | Status: DC
  Administered 2021-10-22 – 2021-10-24 (×3): 8.6 mg via ORAL
  Filled 2021-10-22 (×4): qty 1

## 2021-10-22 NOTE — Progress Notes (Signed)
Inpatient Diabetes Program Recommendations  AACE/ADA: New Consensus Statement on Inpatient Glycemic Control (2015)  Target Ranges:  Prepandial:   less than 140 mg/dL      Peak postprandial:   less than 180 mg/dL (1-2 hours)      Critically ill patients:  140 - 180 mg/dL   Lab Results  Component Value Date   GLUCAP 151 (H) 10/22/2021   HGBA1C >15.0 09/29/2021    Review of Glycemic Control  Latest Reference Range & Units 10/21/21 21:00 10/22/21 00:34 10/22/21 03:25 10/22/21 07:36 10/22/21 12:00  Glucose-Capillary 70 - 99 mg/dL 82 131 (H) 108 (H) 76 151 (H)  (H): Data is abnormally high  Inpatient Diabetes Recommendations:  Might consider decreasing basal to Semglee 60 units BID as patient was 76 mg/dl this morning and refused basal last evening.    Will continue to follow while inpatient.  Thank you, Reche Dixon, RN, BSN Diabetes Coordinator Inpatient Diabetes Program [phone] (team pager from 8a-5p)

## 2021-10-22 NOTE — Progress Notes (Addendum)
Spoke with patient and patient's mother about home care. Patient states her 43 year old daughter helps take care of her at home and she has a nursing aide that comes to the home 8 hours, 5 day a week. Patient's mother at bedside denied a role in care of the 22 year old daughter when the patient is at home.   Note written by Darci Current, Medical Student  Addended by myself to prevent harm of student, patient, or another due to public release of this note.  Wells Guiles, DO 10/22/2021, 5:18 PM PGY-1, [place]

## 2021-10-22 NOTE — Progress Notes (Signed)
Fully awake and cooperative. Assisted in eating dinner and ate 50% of the meal. Patient is alert and oriented to self, place, time and the reason why she is in the hospital. When the RN about to change the dressing on left groin, patient said " they changed  it (left groin wound) this morning. The guy did hydrotherapy". Will monitor.

## 2021-10-22 NOTE — Progress Notes (Signed)
RN was working with patient earlier.  RN will call when she finished with patient.

## 2021-10-22 NOTE — Progress Notes (Signed)
FPTS Brief Progress Note  S:In to see patient. She was asleep but aroused to voice. She denied any pain. States that she had a bowel movement today. Reports that she ate all of her dinner but isn't able to recall what she had.    O: BP 107/60   Pulse 98   Temp 98.1 F (36.7 C) (Oral)   Resp 15   Ht 5\' 2"  (1.575 m)   Wt (!) 154.2 kg   SpO2 100%   BMI 62.18 kg/m   Gen: Obese, asleep but awakens to voice and much more alert and responsive than previous, able to talk in full sentences without falling back asleep, in no distress Cards: RRR Resp: normal work of breathing, CTAB Abd: obese, non-tender in all quadrants, normoactive bowel sounds   A/P: Left Groin Abscess s/p I&D: Stable -Continue Abx and pain regimen  AMS: Improved Able to talk in full sentences without drifting back to sleep which is improvement from last night. Easily awakens to voice. Speech is clear and not slurred.  -Holding Gabapentin and oxy  -Continue to monitor mentation   Constipation Reports bowel movement today. -Bowel regimen per day team   T2DM Her Semglee was decreased to 30U tonight due to unclear PO intake. Patient reports that she did eat all her dinner. If CBGs continue to rise, will consider adding back additional Semglee.  -Monitor CBG's  Other problems per day team progress note   - Orders reviewed. Labs for AM ordered, which was adjusted as needed.   Sharion Settler, DO 10/22/2021, 10:35 PM PGY-2,  Family Medicine Night Resident  Please page [phone] with questions.

## 2021-10-22 NOTE — Progress Notes (Signed)
   10/22/21 0432  Assess: MEWS Score  Temp 98.1 F (36.7 C)  BP (!) 143/72  Pulse Rate 90  ECG Heart Rate 90  Resp 11  Level of Consciousness Responds to Voice  SpO2 94 %  O2 Device Bi-PAP  O2 Flow Rate (L/min) 3 L/min  Assess: MEWS Score  MEWS Temp 0  MEWS Systolic 0  MEWS Pulse 0  MEWS RR 1  MEWS LOC 1  MEWS Score 2  MEWS Score Color Yellow  Assess: if the MEWS score is Yellow or Red  Were vital signs taken at a resting state? No  Focused Assessment No change from prior assessment  Early Detection of Sepsis Score *See Row Information* Low  MEWS guidelines implemented *See Row Information* Yes  Treat  Pain Score Asleep  Take Vital Signs  Increase Vital Sign Frequency  Yellow: Q 2hr X 2 then Q 4hr X 2, if remains yellow, continue Q 4hrs  Escalate  MEWS: Escalate Yellow: discuss with charge nurse/RN and consider discussing with provider and RRT  Notify: Charge Nurse/RN  Name of Charge Nurse/RN Notified Shanell, RN  Date Charge Nurse/RN Notified 10/22/21  Time Charge Nurse/RN Notified [phone]  Document  Patient Outcome Other (Comment) (hemodynamically stable)  Progress note created (see row info) Yes

## 2021-10-22 NOTE — Progress Notes (Signed)
Physical Therapy Wound Treatment Patient Details  Name: [name] MRN: [id number] Date of Birth: [birth year]  Today's Date: 10/22/2021 Time: 1210-1251 Time Calculation (min): 41 min  Subjective  Subjective Assessment Subjective: I'm so sorry... Patient and Family Stated Goals: Heal wounds Date of Onset:  (Unknown) Prior Treatments:  (I&D 11/11)  Pain Score:  6/10 with premedication  Wound Assessment     Wound / Incision (Open or Dehisced) 10/20/21 Incision - Open Thigh Anterior;Proximal;Left Groin (Active)  Wound Image   10/20/21 1500  Dressing Type Barrier Film (skin prep);Gauze (Comment);ABD;Moist to dry;Normal saline moist dressing;Tape dressing 10/22/21 1328  Dressing Changed Changed 10/22/21 1328  Dressing Status Intact 10/22/21 1328  Dressing Change Frequency Daily 10/22/21 1328  Site / Wound Assessment Red;Pink;Yellow 10/22/21 1328  % Wound base Red or Granulating 95% 10/22/21 1328  % Wound base Yellow/Fibrinous Exudate 0% 10/22/21 1328  % Wound base Black/Eschar 0% 10/22/21 1328  % Wound base Other/Granulation Tissue (Comment) 0% 10/22/21 1328  Peri-wound Assessment Maceration;Intact 10/22/21 1328  Wound Length (cm) 7.5 cm 10/20/21 1500  Wound Width (cm) 5 cm 10/20/21 1500  Wound Surface Area (cm^2) 37.5 cm^2 10/20/21 1500  Tunneling (cm) Wound tracks around with drain present and exiting out of both incision sites. Difficult to measure depth due to drains present 10/20/21 1500  Margins Unattached edges (unapproximated) 10/22/21 1328  Closure None 10/20/21 1500  Drainage Amount Moderate 10/22/21 1328  Drainage Description Serous;No odor 10/22/21 1328  Treatment Cleansed;Debridement (Selective);Hydrotherapy (Pulse lavage);Packing (Saline gauze) 10/22/21 1328      Hydrotherapy Pulsed lavage therapy - wound location: L groin Pulsed Lavage with Suction (psi): 4 psi Pulsed Lavage with Suction - Normal Saline Used: 1000 mL Pulsed Lavage Tip: Tip with splash  shield Selective Debridement Selective Debridement - Location: L groin Selective Debridement - Tools Used: Forceps Selective Debridement - Tissue Removed: yellow slough    Wound Assessment and Plan  Wound Therapy - Assess/Plan/Recommendations Wound Therapy - Clinical Statement: Pt presents to hydrotherapy s/p L inguinal I&D. Slough and purulence noted around both incision sites. This patient will benefit from continued hydrotherapy for selective removal of unviable tissue, to decrease bioburden, and promote wound bed healing. 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 Factors Delaying/Impairing Wound Healing: Infection - systemic/local, Diabetes Mellitus Hydrotherapy Plan: Debridement, Dressing change, Patient/family education, Pulsatile lavage with suction Wound Therapy - Frequency: 6X / week Wound Therapy - Follow Up Recommendations: dressing changes by RN  Wound Therapy Goals- Improve the function of patient's integumentary system by progressing the wound(s) through the phases of wound healing (inflammation - proliferation - remodeling) by: Wound Therapy Goals - Improve the function of patient's integumentary system by progressing the wound(s) through the phases of wound healing by: Decrease Necrotic Tissue to: 20% Decrease Necrotic Tissue - Progress: Progressing toward goal Increase Granulation Tissue to: 80% Increase Granulation Tissue - Progress: Progressing toward goal Goals/treatment plan/discharge plan were made with and agreed upon by patient/family: Yes Time For Goal Achievement: 7 days Wound Therapy - Potential for Goals: Good  Goals will be updated until maximal potential achieved or discharge criteria met.  Discharge criteria: when goals achieved, discharge from hospital, MD decision/surgical intervention, no progress towards goals, refusal/missing three consecutive treatments without notification or medical  reason.  GP     Charges PT Wound Care Charges $Wound Debridement up to 20 cm: < or equal to 20 cm $ Wound Debridement each add'l 20 sqcm: 1 $PT Hydrotherapy Dressing: 1  dressing $PT PLS Gun and Tip: 1 Supply $PT Hydrotherapy Visit: 1 Visit       [name] Dalynn Jhaveri 10/22/2021, 1:33 PM 10/22/2021  Ginger Carne., PT Acute Rehabilitation Services [phone]  (pager) [phone]  (office)

## 2021-10-22 NOTE — Progress Notes (Signed)
Patient ID: [name], female   DOB: 30-Jan-1976, 45 y.o.   MRN: [id number] S: Lethargic this morning and wearing CPAP. O:BP 139/84 (BP Location: Right Arm)   Pulse 86   Temp 98.2 F (36.8 C) (Axillary)   Resp 12   Ht '5\' 2"'  (1.575 m)   Wt (!) 154.2 kg   SpO2 97%   BMI 62.18 kg/m   Intake/Output Summary (Last 24 hours) at 10/22/2021 1040 Last data filed at 10/22/2021 0435 Gross per 24 hour  Intake 240 ml  Output 2525 ml  Net -2285 ml   Intake/Output: I/O last 3 completed shifts: In: 65 [P.O.:480; IV Piggyback:100] Out: 6195 [Urine:3875; Drains:1600]  Intake/Output this shift:  No intake/output data recorded. Weight change:  KDT:OIZTI, NAD CVS: RRR Resp: CTA Abd: +BS, soft, NT/ND Ext: no edema s/p LBKA and right TMA  Recent Labs  Lab 10/18/21 0833 10/18/21 1223 10/18/21 1806 10/19/21 0145 10/19/21 1230 10/20/21 0430 10/21/21 0606 10/22/21 0211  NA 127* 127* 125* 123* 125* 126* 130* 134*  K 3.6 4.2 4.0 3.9 4.2 4.1 4.1 3.9  CL 92* 94* 90* 93* 92* 94* 98 100  CO2 21* 21* 19* 17* 19* 18* 20* 22  GLUCOSE 243* 281* 247* 330* 325* 247* 260* 116*  BUN 38* 38* 38* 43* 45* 49* 56* 56*  CREATININE 4.50* 4.62* 4.55* 4.99* 5.59* 5.62* 5.17* 4.39*  ALBUMIN 1.9*  --   --   --   --   --   --   --   CALCIUM 7.6* 7.9* 7.9* 7.3* 7.7* 7.7* 8.1* 8.6*  AST 13*  --   --   --   --   --   --   --   ALT 12  --   --   --   --   --   --   --    Liver Function Tests: Recent Labs  Lab 10/18/21 0833  AST 13*  ALT 12  ALKPHOS 187*  BILITOT 0.4  PROT 7.1  ALBUMIN 1.9*   No results for input(s): LIPASE, AMYLASE in the last 168 hours. Recent Labs  Lab 10/21/21 1126  AMMONIA 21   CBC: Recent Labs  Lab 10/17/21 0355 10/18/21 0833 10/19/21 0625 10/21/21 0606 10/22/21 0211  WBC 16.9* 13.4* 12.1* 9.6 11.1*  HGB 10.8* 9.8* 9.7* 9.3* 10.3*  HCT 32.9* 29.1* 30.3* 28.9* 32.1*  MCV 93.7 93.3 95.6 93.8 94.4  PLT 322 334 372 433* 511*   Cardiac Enzymes: No results for input(s):  CKTOTAL, CKMB, CKMBINDEX, TROPONINI in the last 168 hours. CBG: Recent Labs  Lab 10/21/21 1658 10/21/21 2100 10/22/21 0034 10/22/21 0325 10/22/21 0736  GLUCAP 143* 82 131* 108* 76    Iron Studies: No results for input(s): IRON, TIBC, TRANSFERRIN, FERRITIN in the last 72 hours. Studies/Results: CT HEAD WO CONTRAST (5MM)  Result Date: 10/22/2021 CLINICAL DATA:  Altered mental status EXAM: CT HEAD WITHOUT CONTRAST TECHNIQUE: Contiguous axial images were obtained from the base of the skull through the vertex without intravenous contrast. COMPARISON:  04/17/2021 FINDINGS: Brain: No evidence of acute infarction, hemorrhage, hydrocephalus, extra-axial collection or mass lesion/mass effect. Vascular: No hyperdense vessel or unexpected calcification. Skull: Normal. Negative for fracture or focal lesion. Sinuses/Orbits: No acute abnormality noted. 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 Other: None. IMPRESSION: No acute intracranial abnormality noted. Electronically Signed   By: Inez Catalina M.D.   On: 10/22/2021 03:02    acetaminophen  650 mg Oral Q6H  Or   acetaminophen  650 mg Rectal Q6H   amoxicillin-clavulanate  1 tablet Oral Daily   atorvastatin  80 mg Oral q1800   chlorhexidine  15 mL Mouth/Throat Once   Or   mouth rinse  15 mL Mouth Rinse Once   Chlorhexidine Gluconate Cloth  6 each Topical Daily   doxycycline  100 mg Oral Q12H   ferrous sulfate  325 mg Oral QODAY   insulin aspart  0-20 Units Subcutaneous TID WC   insulin aspart  0-5 Units Subcutaneous QHS   insulin aspart  6 Units Subcutaneous TID WC   insulin glargine-yfgn  75 Units Subcutaneous BID   nystatin   Topical BID   ondansetron (ZOFRAN) IV  4 mg Intravenous Once   pantoprazole  40 mg Oral QHS   polyethylene glycol  34 g Oral BID   senna  1 tablet Oral BID    BMET    Component Value Date/Time   NA 134 (L) 10/22/2021 0211   NA 139 04/24/2021 1119   K 3.9 10/22/2021 0211   CL 100 10/22/2021  0211   CO2 22 10/22/2021 0211   GLUCOSE 116 (H) 10/22/2021 0211   BUN 56 (H) 10/22/2021 0211   BUN 49 (H) 04/24/2021 1119   CREATININE 4.39 (H) 10/22/2021 0211   CREATININE 0.51 08/05/2015 1114   CALCIUM 8.6 (L) 10/22/2021 0211   GFRNONAA 12 (L) 10/22/2021 0211   GFRAA 77 12/09/2020 1605   CBC    Component Value Date/Time   WBC 11.1 (H) 10/22/2021 0211   RBC 3.40 (L) 10/22/2021 0211   HGB 10.3 (L) 10/22/2021 0211   HGB 12.4 04/24/2021 1119   HCT 32.1 (L) 10/22/2021 0211   HCT 39.7 04/24/2021 1119   PLT 511 (H) 10/22/2021 0211   PLT 453 (H) 04/24/2021 1119   MCV 94.4 10/22/2021 0211   MCV 96 04/24/2021 1119   MCH 30.3 10/22/2021 0211   MCHC 32.1 10/22/2021 0211   RDW 14.6 10/22/2021 0211   RDW 13.6 04/24/2021 1119   LYMPHSABS 2.8 04/27/2021 0632   LYMPHSABS 4.5 (H) 09/19/2018 0928   MONOABS 1.0 04/27/2021 0632   EOSABS 0.2 04/27/2021 0632   EOSABS 0.2 09/19/2018 0928   BASOSABS 0.0 04/27/2021 0632   BASOSABS 0.1 09/19/2018 0928    Assessment/ Plan: AKI on CKD 3a - b/l creat from May 2022 is 1.02- 1.37, eGFR 49- >60 ml/min. Creat here 1.5 on admission 3 days ago and creat up to 4.1/ 4.6 today in setting of admission for L groin abscess, now sp I&D. Pt is vol overloaded, 9 L + in 2 days. UA looks infected, renal US w/o obstruction. No shock. AKI likely ATN due to contrast injury +/- IV abx (vanc) toxicity. Made urine yesterday but not much today.  Started on IV lasix 80 bid without much response but had marked diuresis after dose of 120 mg IV BID Scr continues to improve Decrease lasix to 120 mg IV daily due to vigorous diuresis with bid dosing and hopefully can transition to home po dose.   No uremic symptoms or indication for urgent dialysis at this time. Will continue to follow.  L groin abscess - vanc/ zosyn/ flagyl dc'd, getting IV doxy/ Rocephin now.  AMS - has been progressive over the past 2 days and some concern about shaking movements of her arms.  Possible EEG per  primary team.  Doubt this is uremia as her renal function continues to improve.   PAD hx L BKA  Morbid obesity DM2 on insulin HTN - BP's soft, holding home BP medications  Donetta Potts, MD [employer] [phone]

## 2021-10-22 NOTE — Progress Notes (Signed)
Interim Progress Note  S: Received page from RN 90 concerned about increased jerking movements.  Went in to see patient.  She was asleep but was able to respond briefly to voice before going back to sleep. She does have these shaking movements of both arms that are intermittent.   O: Blood pressure (!) 166/93, pulse 100, temperature 98.2 F (36.8 C), temperature source Axillary, resp. rate 15, height 5\' 2"  (1.575 m), weight (!) 154.2 kg, SpO2 100 %. Gen: Asleep, intermittently arousable Resp: On CPAP, sats 99% Neuro: Awakens to verbal stimuli briefly, not verbal, moving arms and hands spontaneously, arms are mostly extended and there is intermittent shaking/tremors of b/l arms, no tongue biting.   A/P AMS  B/l upper extremity tremors  CT head returned negative, which is reassuring against CVA. Question if symptoms may be related to possible Gabapentin withdrawal as patient's chronic home regimen was a much larger dose of 1200 mg TID. Over the last 5 days she dose has significantly been cut down- she only received 600 mg yesterday (16% home dose). Given AKI, she would not be able to tolerate home dose, but given tremors it may be worth looking into possible seizure activity.  -Will discuss with day team regarding possible workup for tremors including EEG -Monitor mental status  Constipation RN reports last BM was 11/10 despite receiving Miralax 17g BID. -Trial Glycerin suppository   Type 2 DM CBG's have been running lower than her normal. They have ranged 82-131 overnight. Her long-acting insulin was held overnight. She only consumed about 50% of her meal.  -Changed CBG's to q4h  -May need to adjust daytime insulin regimen

## 2021-10-22 NOTE — Progress Notes (Addendum)
Family Medicine Teaching Service Daily Progress Note Intern Pager: [phone]  Patient name: [name] Medical record number: [id number] Date of birth: February 25, 1976 Age: 45 y.o. Gender: female  Primary Care Provider: [name], MD Consultants: Nephrology, Surgery (S/O) Code Status: Full  Pt Overview and Major Events to Date:  11/11-admitted, I&D 11/13-nephrology consulted 11/16-sedated mental status 11/18-mental status back to baseline  Assessment and Plan: [name] is a 45 y/o F who presented with left groin abscess, now status post I&D, and onset of acute renal failure now receiving IV diuresis.  Left groin abscess status post I&D (10/16/2021) Patient denies any pain on movement of pannus but states there is general pain around abscess if irritated.  No drainage noted on bandage.  Surgery advises leaving Penrose drain in place which will be removed at a clinic follow-up. -Continue oral doxycycline and oral Augmentin for total antibiotic treatment of 10 to 14 days, per surgery recs -Tylenol 650 mg every 6 hours -Continue hydrotherapy, per surgery, appreciate their recommendations; surgery signed off -Dilaudid 1 mg IV as needed prior to hydrotherapy given cessation of other pain medications in the interim time period  Acute renal failure on CKD 3A; no longer anuric Total UOP 1.25 L yesterday.  Serum creatinine continues to improve from 4.39 to 3.05.  Currently on Lasix 120 mg IV daily.  Nephrology has discontinued IV Lasix to start home p.o. torsemide 80 mg daily tomorrow. -Lasix dosing continued per nephrology, appreciate recommendations  AMS/sedation of unknown cause Prior 36 to 48-hour time period of altered mental status then appearing to be more corresponding to sedated.  VBG, ammonia, Bmet labs unremarkable.  CT head negative.  Patient improved on evening of 11/17.  Suspected to be due to sedation from excess gabapentin in the setting of acute renal failure.  Resolved  at this time.  Plan to continue gabapentin upon discharge of 300 mg nightly. -Continue to hold gabapentin and oxycodone  Constipation Resolved at this time.  Plan to continue bowel regimen given prolonged constipation x7 days.  Patient amenable to plan. -Continue senna twice daily and MiraLAX 34 twice daily  Nausea/vomiting Patient having intermittent episodes of green emesis.  She notes intermittent nausea.  -Adjust Augmentin to be taken with food -Continue to monitor  T2DM CBGs ranging from 124-217, most recently 124.  Adjusted to Semglee 30 units twice daily overnight. -Continue Semglee 30 units twice daily -Continue NovoLog 0 to 20 units 3 times daily with meals at 05 HX +6 units 3 times daily -Continue to monitor intake and adjust Semglee up as able -Diabetes coordinator following, appreciate recommendations  Other conditions (HLD, GERD, OSA, hypertension) chronic and stable  FEN/GI: Carb modified PPx: Heparin every 8 hours Dispo:Home pending clinical improvement . Barriers include antibiotics, acute renal failure, nephrology recs.   Subjective:  Patient is feeling much better and not sedated at this time.  She notes intermittent nausea which sometimes leads to vomiting.  She does not want any pain medications at this time.  Denies pain of any sort except near abscess.  Objective: Temp:  [98.1 F (36.7 C)-98.2 F (36.8 C)] 98.1 F (36.7 C) (11/18 0407) Pulse Rate:  [83-100] 83 (11/18 0407) Resp:  [9-18] 9 (11/18 0407) BP: (107-160)/(60-86) 135/84 (11/18 0407) SpO2:  [86 %-100 %] 99 % (11/18 0407) Physical Exam: General: No acute distress, capable in conversation, single episode of emesis during encounter Cardiovascular: RRR, no murmurs auscultated Respiratory: CTAB, no increased work of breathing Abdomen: Nontender, obese abdomen, bowel sounds  minimally present (difficult to appreciate given body habitus), abscess bandage appears dry  Laboratory: Recent Labs  Lab  10/19/21 0625 10/21/21 0606 10/22/21 0211  WBC 12.1* 9.6 11.1*  HGB 9.7* 9.3* 10.3*  HCT 30.3* 28.9* 32.1*  PLT 372 433* 511*   Recent Labs  Lab 10/18/21 0833 10/18/21 1223 10/21/21 0606 10/22/21 0211 10/23/21 0219  NA 127*   < > 130* 134* 134*  K 3.6   < > 4.1 3.9 3.6  CL 92*   < > 98 100 100  CO2 21*   < > 20* 22 23  BUN 38*   < > 56* 56* 52*  CREATININE 4.50*   < > 5.17* 4.39* 3.05*  CALCIUM 7.6*   < > 8.1* 8.6* 8.6*  PROT 7.1  --   --   --   --   BILITOT 0.4  --   --   --   --   ALKPHOS 187*  --   --   --   --   ALT 12  --   --   --   --   AST 13*  --   --   --   --   GLUCOSE 243*   < > 260* 116* 151*   < > = values in this interval not displayed.   CBG (last 3)  Recent Labs    10/23/21 0406 10/23/21 0725 10/23/21 1123  GLUCAP 146* 124* 163*   Imaging/Diagnostic Tests: No results found.   Wells Guiles, DO 10/23/2021, 2:31 PM PGY-1, [place] Intern pager: [phone], text pages welcome

## 2021-10-22 NOTE — Progress Notes (Signed)
   10/22/21 1115  Clinical Encounter Type  Visited With Patient  Visit Type Initial;Spiritual support  Referral From Nurse  Consult/Referral To Chaplain   Chaplain Jorene Guest responded to the consult request for an Advance Directive and prayer. [name] provided A.D. education. The patient wants to appoint her mother [name], to be her 46. Advised the patient to notify her nurse when the document is ready to be notarized. [name] prayed with the patient. This note was prepared by Jeanine Luz, M.Div..  For questions please contact by phone [phone].

## 2021-10-22 NOTE — Progress Notes (Addendum)
FPTS Brief Progress Note  S: Discussed with RN who notes patient continues to have some mental status changes. He notes her affect is flat. She will intermittently respond to him but seems withdrawn. Her long acting insulin dose was held tonight given her CBG was 82 and she has not been eating much.   During my encounter, she is very drowsy and intermittently responds. She gets agitated easily and states "if he doesn't stop shouting at me I swear I'm going to beat him", but then goes back to being not responsive. She was able to tell me she was at the hospital but she wasn't sure why. She was not very cooperative with examination and hard to keep awake.    O: BP (!) 138/100 (BP Location: Right Arm)   Pulse 97   Temp 98.3 F (36.8 C) (Oral)   Resp 15   Ht 5\' 2"  (1.575 m)   Wt (!) 154.2 kg   SpO2 99%   BMI 62.18 kg/m   Gen: Somnolent, intermittently responsive and agitated  Resp: CPAP on, normal work of breathing Cards: RRR Abd: Obese, non-tender in all quadrants, dressing in place to left groin Neuro: Speech is clear. She is alert and oriented to place but not time or situation. She responds only intermittently. She is able to move her arms and strength seems intentionally weakened. She does not follow commands well. No facial asymmetry noted.   A/P: Altered Mental Status: Acute Since this AM. VBG and ammonia checked. Gabapentin and Oxy were held today. Home does of Gabapentin is 1200 mg TID, she has significantly cut down here. Question if symptoms are related to withdrawal. Will rule out other causes such as acute stroke given persistence and difficult examination. -STAT CT Head w/o contrast -Monitor CBG's to ensure no hypoglycemic episodes   Left Groin Abscess Stable. Continue abx and pain control per day team.  [facility] on board. RN notes good UOP.   T2DM Long-acting insulin held tonight due to CBG of 82 and poor oral intake.  -Monitor CBG -Adjust insulin regimen as  needed   - Orders reviewed. Labs for AM ordered, which was adjusted as needed.    Sharion Settler, DO 10/22/2021, 12:34 AM PGY-2, [facility] Night Resident  Please page [phone] with questions.

## 2021-10-22 NOTE — Progress Notes (Signed)
Pt placed on BIPAP with 2 lpm oxygen bled in.

## 2021-10-22 NOTE — Progress Notes (Addendum)
Family Medicine Teaching Service Daily Progress Note Intern Pager: [phone]  Patient name: [name] Medical record number: [id number] Date of birth: 1976-10-30 Age: 45 y.o. Gender: female  Primary Care Provider: [name], MD Consultants: Nephrology, Surgery Code Status: Full  Pt Overview and Major Events to Date:  11/11-admitted, I&D 11/13-nephrology consulted  Assessment and Plan: [name] is a 45 y/o F who presented with left groin abscess, now s/p I&D, and onset of acute renal failure now receiving IV diuresis.  Left groin abscess s/p I&D (10/16/21) Patient did not note any pain on movement of pannus. No purulent drainage noted on bandage.  - Oral doxycycline, oral augmentin - Tylenol 650mg  q6h - Continue hydrotherapy, per surgery, appreciate their recommendatoins - Dilaudid 1mg  IV prn prior to hydrotherapy given cessation of other pain medications in the interim time period  Acute renal failure on CKD3a; no longer anuric Total UOP 2.5L yesterday. Scr continues to improve from 5.17 to 4.39. Na continues to improve to 134, from 130 yesterday. Currently on Lasix 120mg  IV daily.  -Lasix dosing continued by nephrology with consideration to progress to home dosing in the near future -Discontinue Foley, place [facility] -Nephrology following, appreciate recommendations  AMS of unknown cause Pt continued to have intermittent AMS overnight with new onset jerky movements. VBG, ammonia, bmet labs unremarkable. CT head negative. Glucose wnl. AMS began before transition of abx regimen. Holding sedative medication at this time. Pt appears more sedated at this time rather than altered. She can not stay awake long enough to answer questions but recalls events of yesterday such as hydrotherapy and seeing her family. Responds to commands. Active movement and strength of upper extremities intact and appropriate. She endorses tremor of upper extremities existing prior to  hospitalization. Tremor only appears during active motion of bring arms up to head. No resting tremor identified. Not c/w seizure, stroke, metabolic disturbance. Labs and imaging appear appropriate. Drug cause unlikely for sedative effect given medications have been held. Onset of sedation/AMS prior to transition of abx tx plan so unlikely to be abx-caused. Leading on differential is sedative effect of gabapentin secondary to renal disease/dysfunction.  Other consideration of UTI causing her altered mental state. -Continue cessation of gabapentin and oxycodone at this time -Discontinue Foley, place purewick to reduce infectious risk -Continue to monitor  Constipation No BM x 7days (10/15/21). Glycerin suppository ON. Bowel sounds difficult to appreciate over pannus but minimally present.  - Senna BID and Miralax 34g BID  T2DM CBGs ranging from 76-143, most recently 45. Long acting insulin held given patient is not eating frequently. Vitals ON stable.  -Adjust Semglee to 60u BID, per diabetes coordinator recs, appreciate recommendations -Continue novolog 0-20u TID w/ meals and 0-5 HX + 6u TID  Chronic hypercapneic and hypoxemic respiratory failure, OHS/OSA Chronic, stable.  Patient using BiPAP overnight. -Continue use of BiPAP at night and Dulera and Incruse Ellipta as needed  Other conditions (HLD, GERD, OSA, HTN) chronic and stable  FEN/GI: Carb modified PPx: SCDs Dispo:Home pending clinical improvement . Barriers include abx, UOP monitoring, improvement in AMS, nephrology recs for kidney dysfxn.   Subjective:  Pt minimally able to converse. Prompted awake 20+ times during encounter. She recalls seeing her daughter and mother yesterday and her hydrotherapy session. Will respond yes/no to questions to but can not stay awake long enough to answer with full sentences.  Objective: Temp:  [97.5 F (36.4 C)-98.3 F (36.8 C)] 98.2 F (36.8 C) (11/17 0739) Pulse Rate:  [82-100] 86 (  11/17  0739) Resp:  [9-15] 12 (11/17 0739) BP: (136-166)/(72-100) 139/84 (11/17 0739) SpO2:  [94 %-100 %] 97 % (11/17 0739) Physical Exam: General: NAD, appearing sedated, not capable in having prolonged conversation Cardiovascular: RRR, no murmurs auscultated Respiratory: no increased work of breathing Abdomen: severely obese, bowel sounds present, nontender when pannus is relieved, no bleeding noted over bandage Extremities: left BKA Neuro: awakens to verbal cues but then falls back asleep, 5/5 grip strength bilaterally, responds to commands, active movement in upper extremities bilaterally, flapping tremor when arms raised to head but cessation of tremor when returned to resting state, no tremor present during passive motion  Laboratory: Recent Labs  Lab 10/19/21 0625 10/21/21 0606 10/22/21 0211  WBC 12.1* 9.6 11.1*  HGB 9.7* 9.3* 10.3*  HCT 30.3* 28.9* 32.1*  PLT 372 433* 511*   Recent Labs  Lab 10/18/21 0833 10/18/21 1223 10/20/21 0430 10/21/21 0606 10/22/21 0211  NA 127*   < > 126* 130* 134*  K 3.6   < > 4.1 4.1 3.9  CL 92*   < > 94* 98 100  CO2 21*   < > 18* 20* 22  BUN 38*   < > 49* 56* 56*  CREATININE 4.50*   < > 5.62* 5.17* 4.39*  CALCIUM 7.6*   < > 7.7* 8.1* 8.6*  PROT 7.1  --   --   --   --   BILITOT 0.4  --   --   --   --   ALKPHOS 187*  --   --   --   --   ALT 12  --   --   --   --   AST 13*  --   --   --   --   GLUCOSE 243*   < > 247* 260* 116*   < > = values in this interval not displayed.   Imaging/Diagnostic Tests: CT HEAD WO CONTRAST (5MM)  Result Date: 10/22/2021 CLINICAL DATA:  Altered mental status EXAM: CT HEAD WITHOUT CONTRAST TECHNIQUE: Contiguous axial images were obtained from the base of the skull through the vertex without intravenous contrast. COMPARISON:  04/17/2021 FINDINGS: Brain: No evidence of acute infarction, hemorrhage, hydrocephalus, extra-axial collection or mass lesion/mass effect. Vascular: No hyperdense vessel or unexpected  calcification. Skull: Normal. Negative for fracture or focal lesion. Sinuses/Orbits: No acute abnormality noted. 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 Other: None. IMPRESSION: No acute intracranial abnormality noted. Electronically Signed   By: Inez Catalina M.D.   On: 10/22/2021 03:02     Wells Guiles, DO 10/22/2021, 7:42 AM PGY-1, [place] Intern pager: [phone], text pages welcome

## 2021-10-22 NOTE — Progress Notes (Signed)
Physical Therapy Treatment Patient Details Name: [name] MRN: [id number] DOB: 01-Oct-1976 Today's Date: 10/22/2021   History of Present Illness 45 y.o. female who presented 10/16/21 with left groin abscess, hyperglycemia, and chest pain. S/p I&D of L inguinal abscess. PMH: type 2 diabetes, morbid obesity, obesity hypoventilation syndrome, OSA wears BiPAP at night, hypertension, asthma, GERD, hyperlipidemia, restless leg syndrome, left BKA, R transmetatarsal amputation, mood disorder, hypertension, history of chest pain    PT Comments    Pt lethargic initially, but as pt sat up and progressed during session she became more awake and perseverated less on words and had less difficulty expressing herself. Pt requiring extra time for all functional mobility to mentally prepare. Educated pt on importance of continued mobility to reduce risk of functional decline as pt desires to d/c home. Pt agreeable to x1 standing gait bout with RW using her prosthesis, requiring minA to ambulate ~4 ft. However, pt limited in mobility distance by lightheadedness. Pt would benefit from possibly having her orthostatics measured in future sessions. Will continue to follow acutely. Current recommendations remain appropriate.    Recommendations for follow up therapy are one component of a multi-disciplinary discharge planning process, led by the attending physician.  Recommendations may be updated based on patient status, additional functional criteria and insurance authorization.  Follow Up Recommendations  Home health PT     Assistance Recommended at Discharge Frequent or constant Supervision/Assistance  Equipment Recommendations  None recommended by PT    Recommendations for Other Services       Precautions / Restrictions Precautions Precautions: Fall Precaution Comments: monitor BP; prior R transmet amputation - wears boot on R foot ; L BKA with prosthetic Restrictions Weight Bearing Restrictions:  Yes RLE Weight Bearing: Partial weight bearing Other Position/Activity Restrictions: Pt reports she is supposed to wear a boot on R foot, try to offload wound     Mobility  Bed Mobility Overal bed mobility: Needs Assistance Bed Mobility: Supine to Sit;Sit to Supine     Supine to sit: Min assist;Min guard;HOB elevated Sit to supine: Min assist   General bed mobility comments: Pt requesting PT hand to pull trunk up to sit first rep, but quickly popped up to sit using bed rails with min guard second rep when mother arrived. MinA to manage legs back to supine.    Transfers Overall transfer level: Needs assistance Equipment used: Rolling walker (2 wheels) Transfers: Sit to/from Stand Sit to Stand: Min assist;From elevated surface           General transfer comment: Pt with use of momentum but was able to stand with min A and cues for safety    Ambulation/Gait Ambulation/Gait assistance: Min assist Gait Distance (Feet): 4 Feet Assistive device: Rolling walker (2 wheels) Gait Pattern/deviations: Step-to pattern;Decreased stride length;Wide base of support Gait velocity: decreased Gait velocity interpretation: <1.31 ft/sec, indicative of household ambulator   General Gait Details: Pt with light minA to step anteriorly, but heavier minA for stability when stepping posteriorly, displaying difficulty clearing her feet to advance posteriorly. Limited in distance by lightheadedness.   Stairs             Wheelchair Mobility    Modified Rankin (Stroke Patients Only)       Balance Overall balance assessment: Needs assistance Sitting-balance support: No upper extremity supported;Feet supported Sitting balance-Leahy Scale: Good Sitting balance - Comments: Supervision sitting EOB.   Standing balance support: Bilateral upper extremity supported Standing balance-Leahy Scale: Poor Standing balance comment: reliant on  UE support                             Cognition Arousal/Alertness: Lethargic;Awake/alert Behavior During Therapy: WFL for tasks assessed/performed Overall Cognitive Status: No family/caregiver present to determine baseline cognitive functioning Area of Impairment: Memory;Problem solving                     Memory: Decreased short-term memory       Problem Solving: Slow processing;Decreased initiation General Comments: Pt lethargic at beginning, but improved alertness as session progressed and especially once her mother arrived at end of session. Pt initially perseverating on words and having difficulty completing sentences/thoughts, but improved fluidity as session progressed again much better when mother was there.        Exercises      General Comments General comments (skin integrity, edema, etc.): VSS on RA, lightheaded when standing and taking steps but unsure of BP as inaccurate reading due to pt tensing arm      Pertinent Vitals/Pain Pain Assessment: Faces Faces Pain Scale: Hurts little more Pain Location: groin, R foot Pain Descriptors / Indicators: Discomfort;Guarding Pain Intervention(s): Limited activity within patient's tolerance;Monitored during session;Repositioned    Home Living                          Prior Function            PT Goals (current goals can now be found in the care plan section) Acute Rehab PT Goals Patient Stated Goal: to go home PT Goal Formulation: With patient Time For Goal Achievement: 11/01/21 Potential to Achieve Goals: Good Progress towards PT goals: Progressing toward goals    Frequency    Min 3X/week      PT Plan Current plan remains appropriate    Co-evaluation              AM-PAC PT "6 Clicks" Mobility   Outcome Measure  Help needed turning from your back to your side while in a flat bed without using bedrails?: A Little Help needed moving from lying on your back to sitting on the side of a flat bed without using bedrails?: A  Little Help needed moving to and from a bed to a chair (including a wheelchair)?: A Little Help needed standing up from a chair using your arms (e.g., wheelchair or bedside chair)?: A Little Help needed to walk in hospital room?: Total Help needed climbing 3-5 steps with a railing? : Total 6 Click Score: 14    End of Session Equipment Utilized During Treatment: Oxygen Activity Tolerance: Patient tolerated treatment well Patient left: in bed;with call bell/phone within reach;with bed alarm set;with family/visitor present;Other (comment) (with MD in room) Nurse Communication: Mobility status PT Visit Diagnosis: Difficulty in walking, not elsewhere classified (R26.2);Pain;Unsteadiness on feet (R26.81);Other abnormalities of gait and mobility (R26.89);Muscle weakness (generalized) (M62.81) Pain - Right/Left: Left Pain - part of body:  (groin)     Time: 6195-0932 PT Time Calculation (min) (ACUTE ONLY): 42 min  Charges:  $Gait Training: 8-22 mins $Therapeutic Activity: 23-37 mins                     Moishe Spice, PT, DPT Acute Rehabilitation Services  Pager: [phone] Office: [phone]    Orvan Falconer 10/22/2021, 5:33 PM

## 2021-10-22 NOTE — Progress Notes (Signed)
Spoke with CPS of [facility] regarding concerns of the daughter of [name].  They are requesting further information on the situation of our patient, [name], and her daughter's living situation. In the last 24 hours, concerns have been raised of the patient's daughters safety at home. Patient and her daughter live alone without anyone else in apartment.  The mother of patient (grandmother of daughter) declined being actively involved in the care at home.  Patient has a nurse aide that comes by 8 hours a day 5 days a week to provide care.  Questionable whether there is sufficient food, financial, educational needs being met for daughter given admission that daughter aids in care of our patient at home.  I was unable to answer questions in regards to active safety in the home by means of running water, electricity, use of firearms in the home.  For the last 36 hours, patient has had questionable altered mental state and possibly sedated for unknown cause.  She has been unable to answer questions appropriately.  As patient has been admitted with [place] for the last 7 days, I am concerned that there is no one actively taking care of the child.

## 2021-10-23 DIAGNOSIS — N179 Acute kidney failure, unspecified: Secondary | ICD-10-CM | POA: Diagnosis not present

## 2021-10-23 DIAGNOSIS — L02214 Cutaneous abscess of groin: Secondary | ICD-10-CM | POA: Diagnosis not present

## 2021-10-23 DIAGNOSIS — I5030 Unspecified diastolic (congestive) heart failure: Secondary | ICD-10-CM

## 2021-10-23 DIAGNOSIS — E1165 Type 2 diabetes mellitus with hyperglycemia: Secondary | ICD-10-CM | POA: Diagnosis not present

## 2021-10-23 LAB — GLUCOSE, CAPILLARY
Glucose-Capillary: 124 mg/dL — ABNORMAL HIGH (ref 70–99)
Glucose-Capillary: 144 mg/dL — ABNORMAL HIGH (ref 70–99)
Glucose-Capillary: 146 mg/dL — ABNORMAL HIGH (ref 70–99)
Glucose-Capillary: 163 mg/dL — ABNORMAL HIGH (ref 70–99)
Glucose-Capillary: 168 mg/dL — ABNORMAL HIGH (ref 70–99)
Glucose-Capillary: 243 mg/dL — ABNORMAL HIGH (ref 70–99)

## 2021-10-23 LAB — BASIC METABOLIC PANEL
Anion gap: 11 (ref 5–15)
BUN: 52 mg/dL — ABNORMAL HIGH (ref 6–20)
CO2: 23 mmol/L (ref 22–32)
Calcium: 8.6 mg/dL — ABNORMAL LOW (ref 8.9–10.3)
Chloride: 100 mmol/L (ref 98–111)
Creatinine, Ser: 3.05 mg/dL — ABNORMAL HIGH (ref 0.44–1.00)
GFR, Estimated: 19 mL/min — ABNORMAL LOW (ref >60)
Glucose, Bld: 151 mg/dL — ABNORMAL HIGH (ref 70–99)
Potassium: 3.6 mmol/L (ref 3.5–5.1)
Sodium: 134 mmol/L — ABNORMAL LOW (ref 135–145)

## 2021-10-23 MED ORDER — TORSEMIDE 20 MG PO TABS
80.0000 mg | ORAL_TABLET | Freq: Every day | ORAL | Status: DC
  Administered 2021-10-24 – 2021-10-25 (×2): 80 mg via ORAL
  Filled 2021-10-23 (×3): qty 4

## 2021-10-23 MED ORDER — AMOXICILLIN-POT CLAVULANATE 500-125 MG PO TABS
1.0000 | ORAL_TABLET | Freq: Two times a day (BID) | ORAL | Status: DC
  Administered 2021-10-23 – 2021-10-25 (×4): 500 mg via ORAL
  Filled 2021-10-23 (×5): qty 1

## 2021-10-23 MED ORDER — GABAPENTIN 300 MG PO CAPS
300.0000 mg | ORAL_CAPSULE | Freq: Every evening | ORAL | Status: DC | PRN
  Administered 2021-10-23: 300 mg via ORAL
  Filled 2021-10-23: qty 1

## 2021-10-23 NOTE — Progress Notes (Signed)
Progress Note  7 Days Post-Op  Subjective: Pt mental status much improved from earlier in the week. She reports some pain in L groin. She has an aide at home and her daughter who can help cleanse the area and apply a new pad BID.   Objective: Vital signs in last 24 hours: Temp:  [97.9 F (36.6 C)-98.2 F (36.8 C)] 98.1 F (36.7 C) (11/18 0407) Pulse Rate:  [83-100] 83 (11/18 0407) Resp:  [9-18] 9 (11/18 0407) BP: (107-160)/(60-86) 135/84 (11/18 0407) SpO2:  [86 %-100 %] 99 % (11/18 0407) Last BM Date: 10/22/21  Intake/Output from previous day: 11/17 0701 - 11/18 0700 In: 124 [IV Piggyback:124] Out: 1250 [Urine:1250] Intake/Output this shift: No intake/output data recorded.  PE: General: sitting up in bed in NAD skin: Mons pannus erythematous but remains less indurated. Wound open with penrose in place with some purulent sanguinous at wound and on ABD. Surrounding skin mildly macerated  Psych: alert and oriented, appropriate    Lab Results:  Recent Labs    10/21/21 0606 10/22/21 0211  WBC 9.6 11.1*  HGB 9.3* 10.3*  HCT 28.9* 32.1*  PLT 433* 511*   BMET Recent Labs    10/22/21 0211 10/23/21 0219  NA 134* 134*  K 3.9 3.6  CL 100 100  CO2 22 23  GLUCOSE 116* 151*  BUN 56* 52*  CREATININE 4.39* 3.05*  CALCIUM 8.6* 8.6*   PT/INR No results for input(s): LABPROT, INR in the last 72 hours. CMP     Component Value Date/Time   NA 134 (L) 10/23/2021 0219   NA 139 04/24/2021 1119   K 3.6 10/23/2021 0219   CL 100 10/23/2021 0219   CO2 23 10/23/2021 0219   GLUCOSE 151 (H) 10/23/2021 0219   BUN 52 (H) 10/23/2021 0219   BUN 49 (H) 04/24/2021 1119   CREATININE 3.05 (H) 10/23/2021 0219   CREATININE 0.51 08/05/2015 1114   CALCIUM 8.6 (L) 10/23/2021 0219   PROT 7.1 10/18/2021 0833   ALBUMIN 1.9 (L) 10/18/2021 0833   AST 13 (L) 10/18/2021 0833   ALT 12 10/18/2021 0833   ALKPHOS 187 (H) 10/18/2021 0833   BILITOT 0.4 10/18/2021 0833   GFRNONAA 19 (L)  10/23/2021 0219   GFRAA 77 12/09/2020 1605   Lipase     Component Value Date/Time   LIPASE 56 (H) 11/01/2020 1720       Studies/Results: CT HEAD WO CONTRAST (5MM)  Result Date: 10/22/2021 CLINICAL DATA:  Altered mental status EXAM: CT HEAD WITHOUT CONTRAST TECHNIQUE: Contiguous axial images were obtained from the base of the skull through the vertex without intravenous contrast. COMPARISON:  04/17/2021 FINDINGS: Brain: No evidence of acute infarction, hemorrhage, hydrocephalus, extra-axial collection or mass lesion/mass effect. Vascular: No hyperdense vessel or unexpected calcification. Skull: Normal. Negative for fracture or focal lesion. Sinuses/Orbits: No acute abnormality noted. 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 Other: None. IMPRESSION: No acute intracranial abnormality noted. Electronically Signed   By: Inez Catalina M.D.   On: 10/22/2021 03:02    Anti-infectives: Anti-infectives (From admission, onward)    Start     Dose/Rate Route Frequency Ordered Stop   10/21/21 2200  doxycycline (VIBRA-TABS) tablet 100 mg        100 mg Oral Every 12 hours 10/21/21 1103     10/21/21 1200  amoxicillin-clavulanate (AUGMENTIN) 500-125 MG per tablet 500 mg        1 tablet Oral Daily 10/21/21 1103  10/17/21 1245  doxycycline (VIBRAMYCIN) 100 mg in sodium chloride 0.9 % 250 mL IVPB  Status:  Discontinued        100 mg 125 mL/hr over 120 Minutes Intravenous Every 12 hours 10/17/21 1157 10/21/21 1103   10/16/21 2230  cefTRIAXone (ROCEPHIN) 2 g in sodium chloride 0.9 % 100 mL IVPB  Status:  Discontinued        2 g 200 mL/hr over 30 Minutes Intravenous Every 24 hours 10/16/21 2143 10/21/21 1103   10/16/21 2230  metroNIDAZOLE (FLAGYL) IVPB 500 mg  Status:  Discontinued        500 mg 100 mL/hr over 60 Minutes Intravenous Every 12 hours 10/16/21 2143 10/17/21 1156   10/16/21 1345  piperacillin-tazobactam (ZOSYN) IVPB 3.375 g        3.375 g 100 mL/hr over 30 Minutes  Intravenous  Once 10/16/21 1341 10/16/21 1534   10/16/21 1215  vancomycin (VANCOCIN) 2,500 mg in sodium chloride 0.9 % 500 mL IVPB        2,500 mg 250 mL/hr over 120 Minutes Intravenous  Once 10/16/21 1201 10/16/21 1438        Assessment/Plan POD 7, s/p I&D for L groin abscess - penrose drain in place with ABD to cover - WBC 11.1 yesterday, afebrile - cx with moderate prevotella bivia - beta lactamase positive. Continue abx for total of at least 14 days  - continue hydrotherapy while admitted to wash this area out as she is unable to get in the shower here due to her BKA, etc. - LEAVE PENROSE [place] not think she has worsening wound or infection at surgical site that would require further operative debridement  - stable for discharge from a surgical perspective once medically cleared. 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

## 2021-10-23 NOTE — TOC Progression Note (Signed)
Transition of Care [facility]) - Progression Note    Patient Details  Name: [name] MRN: [id number] Date of Birth: 05-Jul-1976  Transition of Care [facility]) CM/SW Contact  Joanne Chars, LCSW Phone Number: 10/23/2021, 2:55 PM  Clinical Narrative:  CSW ordered bedside commode from [employer], will be delivered to room.  CSW contacted the following [place] agencies: [facility]: declined [facility]: cannot take medicaid [facility]: cannot take medicaid [facility]: no response          Expected Discharge Plan and Services                           DME Arranged: 3-N-1 DME Agency: [facility] Date DME Agency Contacted: 10/23/21 Time DME Agency Contacted: [id number] Representative spoke with at DME Agency: [place] Determinants of Health ([place]) Interventions    Readmission Risk Interventions No flowsheet data found.

## 2021-10-23 NOTE — Progress Notes (Signed)
FPTS Brief Progress Note  S:Rounded with night RN who relayed she has been having some leg burning and was wanting her home gabapentin earlier in the night   O: BP 132/65 (BP Location: Right Arm)   Pulse 97   Temp 99.1 F (37.3 C) (Oral)   Resp 19   Ht 5\' 2"  (1.575 m)   Wt (!) 154.2 kg   SpO2 96%   BMI 62.18 kg/m    Gen: asleep, resting comfortably  A/P: AMS, Improved Has not been altered tonight per nursing  -300 mg gabapentin at bedtime prn daily given renal function.  - Orders reviewed. Labs for AM ordered, which was adjusted as needed.   T2DM CBGs appropriate -30 U long acting BID  Plan per day team  Gerrit Heck, MD 10/23/2021, 11:45 PM PGY-1, [place] Night Resident  Please page [phone] with questions.

## 2021-10-23 NOTE — Discharge Instructions (Addendum)
WOUND CARE: - dressing to be changed twice daily - supplies: ABD pads, tape  - remove dressing  - clean edges of skin around the wound with water/gauze, making sure there is no tape debris or leakage left on skin that could cause skin irritation or breakdown. - shower at least once daily with wound open and water directly flushing the wound. Do note bathe or submerge in water - cover wound with a dry ABD pad and secure with tape.  - write the date/time on the dry dressing/tape to better track when the last dressing change occurred. - apply any skin protectant/powder recommended by clinician to protect skin/skin folds. - change dressing as needed if leakage occurs  You were hospitalized at [facility] due to a buttock wound and acute kidney failure. We are so glad you are feeling better.  Be sure to follow-up with surgery for drain removal. Please also be sure to follow-up with your PCP on 10/28/21 at 8:30 AM. Pay close attention to your medications are some of them have changed. Thank you for allowing [place] to take care of you.  Take care, [place] Team

## 2021-10-23 NOTE — Progress Notes (Signed)
Spoke with Robby Sermon regarding [facility] CPS notification from number [phone].  She confirmed that there have been 2 other adults in the home taking care of Ms. Page' daughter.  I confirmed that my original concern was financial and food stability as my original impression was that there were no other support people in the picture.  No further concerns given from my point of view at this time.  She has call back number to [place] of [phone].  Wells Guiles, DO 10/23/2021, 2:47 PM PGY-1, [place]

## 2021-10-23 NOTE — Progress Notes (Signed)
Physical Therapy Wound Treatment Patient Details  Name: [name] MRN: [id number] Date of Birth: Feb 12, 1976  Today's Date: 10/23/2021 Time: 1594-5859 Time Calculation (min): 41 min  Subjective  Subjective Assessment Subjective: I'm so sorry... Patient and Family Stated Goals: Heal wounds Date of Onset:  (Unknown) Prior Treatments:  (I&D 11/11)  Pain Score:  4-6 /10 after premedication  Wound Assessment     Wound / Incision (Open or Dehisced) 10/20/21 Incision - Open Thigh Anterior;Proximal;Left Groin (Active)  Wound Image    10/23/21 1345  Dressing Type ABD;Barrier Film (skin prep);Gauze (Comment);Moist to dry;Normal saline moist dressing 10/23/21 1345  Dressing Changed Changed 10/23/21 1345  Dressing Status Clean;Intact 10/23/21 1345  Dressing Change Frequency Daily 10/23/21 1345  Site / Wound Assessment Clean;Bleeding;Red;Yellow 10/23/21 1345  % Wound base Red or Granulating 95% 10/23/21 1345  % Wound base Yellow/Fibrinous Exudate 5% 10/23/21 1345  % Wound base Black/Eschar 0% 10/23/21 1345  % Wound base Other/Granulation Tissue (Comment) 0% 10/23/21 1345  Peri-wound Assessment Maceration;Intact 10/23/21 1345  Wound Length (cm) 7.5 cm 10/20/21 1500  Wound Width (cm) 5 cm 10/20/21 1500  Wound Surface Area (cm^2) 37.5 cm^2 10/20/21 1500  Tunneling (cm) Wound tracks around with drain present and exiting out of both incision sites. Difficult to measure depth due to drains present 10/20/21 1500  Margins Unattached edges (unapproximated) 10/23/21 1345  Closure None 10/20/21 1500  Drainage Amount Moderate 10/23/21 1345  Drainage Description Serous;Sanguineous;Other (Comment) 10/23/21 1345  Treatment Cleansed;Debridement (Selective);Hydrotherapy (Pulse lavage);Packing (Saline gauze) 10/23/21 1345     Incision (Closed) 10/16/21 Groin Left (Active)  Dressing Type ABD 10/22/21 2015  Dressing Clean;Dry;Intact 10/22/21 2015  Dressing Change Frequency PRN 10/22/21 2015  Site / Wound  Assessment Dressing in place / Unable to assess 10/22/21 2015  Closure Sutures 10/19/21 2100  Drainage Amount None 10/22/21 2015  Drainage Description Serosanguineous;Odor 10/19/21 2100  Treatment Other (Comment) 10/22/21 2015   Hydrotherapy Pulsed lavage therapy - wound location: L groin Pulsed Lavage with Suction (psi): 4 psi Pulsed Lavage with Suction - Normal Saline Used: 1000 mL Pulsed Lavage Tip: Tip with splash shield Selective Debridement Selective Debridement - Location: L groin Selective Debridement - Tools Used: Forceps, Scissors Selective Debridement - Tissue Removed: yellow slough    Wound Assessment and Plan  Wound Therapy - Assess/Plan/Recommendations Wound Therapy - Clinical Statement: Pt benefiting most from PLS to flush the wound and decrease bioburden.  I Continue to get necrotic tissue debrided from difficult to visualize areas.  Expect quicker improvement now that debridement is progressing so well. 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 Factors Delaying/Impairing Wound Healing: Infection - systemic/local, Diabetes Mellitus Hydrotherapy Plan: Debridement, Dressing change, Patient/family education, Pulsatile lavage with suction Wound Therapy - Frequency: 6X / week Wound Therapy - Follow Up Recommendations: dressing changes by RN  Wound Therapy Goals- Improve the function of patient's integumentary system by progressing the wound(s) through the phases of wound healing (inflammation - proliferation - remodeling) by: Wound Therapy Goals - Improve the function of patient's integumentary system by progressing the wound(s) through the phases of wound healing by: Decrease Necrotic Tissue to: 0 Decrease Necrotic Tissue - Progress: Goal set today Increase Granulation Tissue to: 100 Increase Granulation Tissue - Progress: Goal set today Goals/treatment plan/discharge plan were made with and agreed upon by  patient/family: Yes Time For Goal Achievement: 7 days Wound Therapy - Potential for Goals: Good  Goals will be updated until maximal potential achieved or  discharge criteria met.  Discharge criteria: when goals achieved, discharge from hospital, MD decision/surgical intervention, no progress towards goals, refusal/missing three consecutive treatments without notification or medical reason.  GP     Charges PT Wound Care Charges $Wound Debridement up to 20 cm: < or equal to 20 cm $ Wound Debridement each add'l 20 sqcm: 1 $PT Hydrotherapy Dressing: 1 dressing $PT PLS Gun and Tip: 1 Supply $PT Hydrotherapy Visit: 1 Visit       [name] Jari Carollo 10/23/2021, 1:54 PM 10/23/2021  Ginger Carne., PT Acute Rehabilitation Services [phone]  (pager) [phone]  (office)

## 2021-10-23 NOTE — Progress Notes (Signed)
Patient ID: [name], female   DOB: 1976/02/09, 45 y.o.   MRN: [id number] S: Feeling much better today.  More awake and alert. O:BP 135/84 (BP Location: Right Arm)   Pulse 83   Temp 98.1 F (36.7 C) (Axillary)   Resp (!) 9   Ht _0  (1.575 m)   Wt (!) 154.2 kg   SpO2 99%   BMI 62.18 kg/m   Intake/Output Summary (Last 24 hours) at 10/23/2021 1205 Last data filed at 10/23/2021 0300 Gross per 24 hour  Intake 124 ml  Output 1250 ml  Net -1126 ml   Intake/Output: I/O last 3 completed shifts: In: 364 [P.O.:240; IV Piggyback:124] Out: 2475 [Urine:2475]  Intake/Output this shift:  No intake/output data recorded. Weight change:  Gen:NAD CVS: RRR Resp:CTA Abd: +BS, soft, NT/ND Ext: s/p left BKA, s/p right TMA, no edema  Recent Labs  Lab 10/18/21 0833 10/18/21 1223 10/18/21 1806 10/19/21 0145 10/19/21 1230 10/20/21 0430 10/21/21 0606 10/22/21 0211 10/23/21 0219  NA 127*   < > 125* 123* 125* 126* 130* 134* 134*  K 3.6   < > 4.0 3.9 4.2 4.1 4.1 3.9 3.6  CL 92*   < > 90* 93* 92* 94* 98 100 100  CO2 21*   < > 19* 17* 19* 18* 20* 22 23  GLUCOSE 243*   < > 247* 330* 325* 247* 260* 116* 151*  BUN 38*   < > 38* 43* 45* 49* 56* 56* 52*  CREATININE 4.50*   < > 4.55* 4.99* 5.59* 5.62* 5.17* 4.39* 3.05*  ALBUMIN 1.9*  --   --   --   --   --   --   --   --   CALCIUM 7.6*   < > 7.9* 7.3* 7.7* 7.7* 8.1* 8.6* 8.6*  AST 13*  --   --   --   --   --   --   --   --   ALT 12  --   --   --   --   --   --   --   --    < > = values in this interval not displayed.   Liver Function Tests: Recent Labs  Lab 10/18/21 0833  AST 13*  ALT 12  ALKPHOS 187*  BILITOT 0.4  PROT 7.1  ALBUMIN 1.9*   No results for input(s): LIPASE, AMYLASE in the last 168 hours. Recent Labs  Lab 10/21/21 1126  AMMONIA 21   CBC: Recent Labs  Lab 10/17/21 0355 10/18/21 0833 10/19/21 0625 10/21/21 0606 10/22/21 0211  WBC 16.9* 13.4* 12.1* 9.6 11.1*  HGB 10.8* 9.8* 9.7* 9.3* 10.3*  HCT 32.9* 29.1*  30.3* 28.9* 32.1*  MCV 93.7 93.3 95.6 93.8 94.4  PLT 322 334 372 433* 511*   Cardiac Enzymes: No results for input(s): CKTOTAL, CKMB, CKMBINDEX, TROPONINI in the last 168 hours. CBG: Recent Labs  Lab 10/22/21 2004 10/23/21 0008 10/23/21 0406 10/23/21 0725 10/23/21 1123  GLUCAP 217* 144* 146* 124* 163*    Iron Studies: No results for input(s): IRON, TIBC, TRANSFERRIN, FERRITIN in the last 72 hours. Studies/Results: CT HEAD WO CONTRAST (5MM)  Result Date: 10/22/2021 CLINICAL DATA:  Altered mental status EXAM: CT HEAD WITHOUT CONTRAST TECHNIQUE: Contiguous axial images were obtained from the base of the skull through the vertex without intravenous contrast. COMPARISON:  04/17/2021 FINDINGS: Brain: No evidence of acute infarction, hemorrhage, hydrocephalus, extra-axial collection or mass lesion/mass effect. Vascular: No hyperdense vessel or unexpected calcification. Skull:  Normal. Negative for fracture or focal lesion. Sinuses/Orbits: No acute abnormality noted. 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 Other: None. IMPRESSION: No acute intracranial abnormality noted. Electronically Signed   By: Inez Catalina M.D.   On: 10/22/2021 03:02    acetaminophen  650 mg Oral Q6H   Or   acetaminophen  650 mg Rectal Q6H   amoxicillin-clavulanate  1 tablet Oral Daily   atorvastatin  80 mg Oral q1800   chlorhexidine  15 mL Mouth/Throat Once   Or   mouth rinse  15 mL Mouth Rinse Once   Chlorhexidine Gluconate Cloth  6 each Topical Daily   doxycycline  100 mg Oral Q12H   ferrous sulfate  325 mg Oral QODAY   heparin  5,000 Units Subcutaneous Q8H   insulin aspart  0-20 Units Subcutaneous TID WC   insulin aspart  0-5 Units Subcutaneous QHS   insulin aspart  6 Units Subcutaneous TID WC   insulin glargine-yfgn  30 Units Subcutaneous BID   nystatin   Topical BID   ondansetron (ZOFRAN) IV  4 mg Intravenous Once   pantoprazole  40 mg Oral QHS   polyethylene glycol  34 g Oral BID    senna  1 tablet Oral BID    BMET    Component Value Date/Time   NA 134 (L) 10/23/2021 0219   NA 139 04/24/2021 1119   K 3.6 10/23/2021 0219   CL 100 10/23/2021 0219   CO2 23 10/23/2021 0219   GLUCOSE 151 (H) 10/23/2021 0219   BUN 52 (H) 10/23/2021 0219   BUN 49 (H) 04/24/2021 1119   CREATININE 3.05 (H) 10/23/2021 0219   CREATININE 0.51 08/05/2015 1114   CALCIUM 8.6 (L) 10/23/2021 0219   GFRNONAA 19 (L) 10/23/2021 0219   GFRAA 77 12/09/2020 1605   CBC    Component Value Date/Time   WBC 11.1 (H) 10/22/2021 0211   RBC 3.40 (L) 10/22/2021 0211   HGB 10.3 (L) 10/22/2021 0211   HGB 12.4 04/24/2021 1119   HCT 32.1 (L) 10/22/2021 0211   HCT 39.7 04/24/2021 1119   PLT 511 (H) 10/22/2021 0211   PLT 453 (H) 04/24/2021 1119   MCV 94.4 10/22/2021 0211   MCV 96 04/24/2021 1119   MCH 30.3 10/22/2021 0211   MCHC 32.1 10/22/2021 0211   RDW 14.6 10/22/2021 0211   RDW 13.6 04/24/2021 1119   LYMPHSABS 2.8 04/27/2021 0632   LYMPHSABS 4.5 (H) 09/19/2018 0928   MONOABS 1.0 04/27/2021 0632   EOSABS 0.2 04/27/2021 0632   EOSABS 0.2 09/19/2018 0928   BASOSABS 0.0 04/27/2021 0632   BASOSABS 0.1 09/19/2018 0928    Assessment/ Plan: AKI on CKD 3a - b/l creat from May 2022 is 1.02- 1.37, eGFR 49- >60 ml/min. Creat here 1.5 on admission 3 days ago and creat up to 4.1/ 4.6 today in setting of admission for L groin abscess, now sp I&D. Pt is vol overloaded, 9 L + in 2 days. UA looks infected, renal US w/o obstruction. No shock. AKI likely ATN due to contrast injury +/- IV abx (vanc) toxicity. Made urine yesterday but not much today.  Started on IV lasix 80 bid without much response but had marked diuresis after dose of 120 mg IV BID Scr continues to improve Decreased lasix to 120 mg IV daily due to vigorous diuresis with bid dosing. Will d/c IV lasix and start home po torsemide 80 mg daily tomorrow.  No uremic symptoms or indication for  urgent dialysis at this time. Will continue to follow.  L  groin abscess - vanc/ zosyn/ flagyl dc'd, getting IV doxy/ Rocephin now. +wound culture prevotella bivia/beta lactamase +.  For 14 days of abx per surgery. AMS - has been progressive over the past 2 days and some concern about shaking movements of her arms.  Possible EEG per primary team.  Doubt this is uremia as her renal function continues to improve.   PAD hx L BKA Morbid obesity DM2 on insulin HTN - BP's soft, holding home BP medications  Donetta Potts, MD [employer] [phone]

## 2021-10-24 DIAGNOSIS — E1165 Type 2 diabetes mellitus with hyperglycemia: Secondary | ICD-10-CM | POA: Diagnosis not present

## 2021-10-24 DIAGNOSIS — L02214 Cutaneous abscess of groin: Secondary | ICD-10-CM | POA: Diagnosis not present

## 2021-10-24 DIAGNOSIS — L0291 Cutaneous abscess, unspecified: Secondary | ICD-10-CM | POA: Diagnosis not present

## 2021-10-24 DIAGNOSIS — N179 Acute kidney failure, unspecified: Secondary | ICD-10-CM | POA: Diagnosis not present

## 2021-10-24 LAB — GLUCOSE, CAPILLARY
Glucose-Capillary: 227 mg/dL — ABNORMAL HIGH (ref 70–99)
Glucose-Capillary: 184 mg/dL — ABNORMAL HIGH (ref 70–99)
Glucose-Capillary: 160 mg/dL — ABNORMAL HIGH (ref 70–99)
Glucose-Capillary: 174 mg/dL — ABNORMAL HIGH (ref 70–99)
Glucose-Capillary: 250 mg/dL — ABNORMAL HIGH (ref 70–99)
Glucose-Capillary: 238 mg/dL — ABNORMAL HIGH (ref 70–99)

## 2021-10-24 LAB — BASIC METABOLIC PANEL
Anion gap: 10 (ref 5–15)
BUN: 49 mg/dL — ABNORMAL HIGH (ref 6–20)
CO2: 27 mmol/L (ref 22–32)
Calcium: 8.7 mg/dL — ABNORMAL LOW (ref 8.9–10.3)
Chloride: 101 mmol/L (ref 98–111)
Creatinine, Ser: 2.38 mg/dL — ABNORMAL HIGH (ref 0.44–1.00)
GFR, Estimated: 25 mL/min — ABNORMAL LOW (ref >60)
Glucose, Bld: 187 mg/dL — ABNORMAL HIGH (ref 70–99)
Potassium: 3.5 mmol/L (ref 3.5–5.1)
Sodium: 138 mmol/L (ref 135–145)

## 2021-10-24 MED ORDER — OXYCODONE HCL 5 MG PO TABS
2.5000 mg | ORAL_TABLET | ORAL | Status: DC | PRN
  Administered 2021-10-24 (×2): 2.5 mg via ORAL
  Filled 2021-10-24 (×3): qty 1

## 2021-10-24 MED ORDER — SENNA 8.6 MG PO TABS
1.0000 | ORAL_TABLET | Freq: Every day | ORAL | Status: DC
  Administered 2021-10-25: 8.6 mg via ORAL
  Filled 2021-10-24: qty 1

## 2021-10-24 MED ORDER — POLYETHYLENE GLYCOL 3350 17 G PO PACK
17.0000 g | PACK | Freq: Two times a day (BID) | ORAL | Status: DC
  Filled 2021-10-24 (×2): qty 1

## 2021-10-24 MED ORDER — GABAPENTIN 300 MG PO CAPS
600.0000 mg | ORAL_CAPSULE | Freq: Every evening | ORAL | Status: DC | PRN
  Administered 2021-10-24: 600 mg via ORAL
  Filled 2021-10-24: qty 2

## 2021-10-24 MED ORDER — INSULIN GLARGINE-YFGN 100 UNIT/ML ~~LOC~~ SOLN
35.0000 [IU] | Freq: Two times a day (BID) | SUBCUTANEOUS | Status: DC
  Administered 2021-10-24 – 2021-10-25 (×2): 35 [IU] via SUBCUTANEOUS
  Filled 2021-10-24 (×4): qty 0.35

## 2021-10-24 NOTE — Progress Notes (Signed)
FPTS Brief Progress Note  S: Patient seen at bedside for evening rounds. She reports her neuropathy pain is bothering her in bilateral legs. Otherwise denies complaints.   O: BP (!) 159/117 (BP Location: Right Wrist)   Pulse 95   Temp 97.7 F (36.5 C) (Oral)   Resp 20   Ht 5\' 2"  (1.575 m)   Wt (!) 154.2 kg   SpO2 98%   BMI 62.18 kg/m   Gen: awake, alert, NAD Resp: normal work of breathing on room air Ext: compression stockings in place  A/P: 45 year old female admitted with left groin abscess now s/p I&D. Hospitalization complicated by AMS which has now resolved and acute renal failure which is improving.  -Plan per day team/daily progress note. -Gabapentin increased to 600mg  qhs starting tonight per day team - Orders reviewed. Labs for AM ordered, which was adjusted as needed.    Alcus Dad, MD 10/24/2021, 9:59 PM PGY-2, [facility] Night Resident  Please page [phone] with questions.

## 2021-10-24 NOTE — Progress Notes (Signed)
Patient ID: [name], female   DOB: 05/30/1976, 45 y.o.   MRN: [id number] S: No complaints. O:BP 132/65 (BP Location: Right Arm)   Pulse 79   Temp 98.2 F (36.8 C) (Oral)   Resp 19   Ht '5\' 2"'  (1.575 m)   Wt (!) 154.2 kg   SpO2 96%   BMI 62.18 kg/m  No intake or output data in the 24 hours ending 10/24/21 1151 Intake/Output: I/O last 3 completed shifts: In: 124 [IV Piggyback:124] Out: -   Intake/Output this shift:  No intake/output data recorded. Weight change:  Gen:NAD CVS:RRR Resp: CTA Abd: +Bs, soft, NT/ND Ext: no edema  Recent Labs  Lab 10/18/21 0833 10/18/21 1223 10/19/21 0145 10/19/21 1230 10/20/21 0430 10/21/21 0606 10/22/21 0211 10/23/21 0219 10/24/21 0546  NA 127*   < > 123* 125* 126* 130* 134* 134* 138  K 3.6   < > 3.9 4.2 4.1 4.1 3.9 3.6 3.5  CL 92*   < > 93* 92* 94* 98 100 100 101  CO2 21*   < > 17* 19* 18* 20* '22 23 27  ' GLUCOSE 243*   < > 330* 325* 247* 260* 116* 151* 187*  BUN 38*   < > 43* 45* 49* 56* 56* 52* 49*  CREATININE 4.50*   < > 4.99* 5.59* 5.62* 5.17* 4.39* 3.05* 2.38*  ALBUMIN 1.9*  --   --   --   --   --   --   --   --   CALCIUM 7.6*   < > 7.3* 7.7* 7.7* 8.1* 8.6* 8.6* 8.7*  AST 13*  --   --   --   --   --   --   --   --   ALT 12  --   --   --   --   --   --   --   --    < > = values in this interval not displayed.   Liver Function Tests: Recent Labs  Lab 10/18/21 0833  AST 13*  ALT 12  ALKPHOS 187*  BILITOT 0.4  PROT 7.1  ALBUMIN 1.9*   No results for input(s): LIPASE, AMYLASE in the last 168 hours. Recent Labs  Lab 10/21/21 1126  AMMONIA 21   CBC: Recent Labs  Lab 10/18/21 0833 10/19/21 0625 10/21/21 0606 10/22/21 0211  WBC 13.4* 12.1* 9.6 11.1*  HGB 9.8* 9.7* 9.3* 10.3*  HCT 29.1* 30.3* 28.9* 32.1*  MCV 93.3 95.6 93.8 94.4  PLT 334 372 433* 511*   Cardiac Enzymes: No results for input(s): CKTOTAL, CKMB, CKMBINDEX, TROPONINI in the last 168 hours. CBG: Recent Labs  Lab 10/23/21 1614 10/23/21 2111  10/24/21 0052 10/24/21 0546 10/24/21 0925  GLUCAP 168* 243* 227* 184* 160*    Iron Studies: No results for input(s): IRON, TIBC, TRANSFERRIN, FERRITIN in the last 72 hours. Studies/Results: No results found.  acetaminophen  650 mg Oral Q6H   Or   acetaminophen  650 mg Rectal Q6H   amoxicillin-clavulanate  1 tablet Oral BID WC   atorvastatin  80 mg Oral q1800   chlorhexidine  15 mL Mouth/Throat Once   Or   mouth rinse  15 mL Mouth Rinse Once   Chlorhexidine Gluconate Cloth  6 each Topical Daily   doxycycline  100 mg Oral Q12H   ferrous sulfate  325 mg Oral QODAY   heparin  5,000 Units Subcutaneous Q8H   insulin aspart  0-20 Units Subcutaneous TID WC   insulin  aspart  0-5 Units Subcutaneous QHS   insulin aspart  6 Units Subcutaneous TID WC   insulin glargine-yfgn  30 Units Subcutaneous BID   nystatin   Topical BID   ondansetron (ZOFRAN) IV  4 mg Intravenous Once   pantoprazole  40 mg Oral QHS   polyethylene glycol  34 g Oral BID   senna  1 tablet Oral BID   torsemide  80 mg Oral Daily    BMET    Component Value Date/Time   NA 138 10/24/2021 0546   NA 139 04/24/2021 1119   K 3.5 10/24/2021 0546   CL 101 10/24/2021 0546   CO2 27 10/24/2021 0546   GLUCOSE 187 (H) 10/24/2021 0546   BUN 49 (H) 10/24/2021 0546   BUN 49 (H) 04/24/2021 1119   CREATININE 2.38 (H) 10/24/2021 0546   CREATININE 0.51 08/05/2015 1114   CALCIUM 8.7 (L) 10/24/2021 0546   GFRNONAA 25 (L) 10/24/2021 0546   GFRAA 77 12/09/2020 1605   CBC    Component Value Date/Time   WBC 11.1 (H) 10/22/2021 0211   RBC 3.40 (L) 10/22/2021 0211   HGB 10.3 (L) 10/22/2021 0211   HGB 12.4 04/24/2021 1119   HCT 32.1 (L) 10/22/2021 0211   HCT 39.7 04/24/2021 1119   PLT 511 (H) 10/22/2021 0211   PLT 453 (H) 04/24/2021 1119   MCV 94.4 10/22/2021 0211   MCV 96 04/24/2021 1119   MCH 30.3 10/22/2021 0211   MCHC 32.1 10/22/2021 0211   RDW 14.6 10/22/2021 0211   RDW 13.6 04/24/2021 1119   LYMPHSABS 2.8 04/27/2021  0632   LYMPHSABS 4.5 (H) 09/19/2018 0928   MONOABS 1.0 04/27/2021 0632   EOSABS 0.2 04/27/2021 0632   EOSABS 0.2 09/19/2018 0928   BASOSABS 0.0 04/27/2021 0632   BASOSABS 0.1 09/19/2018 0928    Assessment/ Plan: AKI on CKD 3a - b/l creat from May 2022 is 1.02- 1.37, eGFR 49- >60 ml/min. Creat here 1.5 on admission 3 days ago and creat up to 4.1/ 4.6 today in setting of admission for L groin abscess, now sp I&D. Pt is vol overloaded, 9 L + in 2 days. UA looks infected, renal US w/o obstruction. No shock. AKI likely ATN due to contrast injury +/- IV abx (vanc) toxicity. Made urine yesterday but not much today.  Started on IV lasix 80 bid without much response but had marked diuresis after dose of 120 mg IV BID Scr continues to improve Decreased lasix to 120 mg IV daily due to vigorous diuresis with bid dosing. Off IV lasix and on home po torsemide 80 mg daily.  No uremic symptoms or indication for urgent dialysis at this time. Will continue to follow.  L groin abscess - vanc/ zosyn/ flagyl dc'd, getting IV doxy/ Rocephin now. +wound culture prevotella bivia/beta lactamase +.  For 14 days of abx per surgery. AMS - has been progressive over the past 2 days and some concern about shaking movements of her arms.  Possible EEG per primary team.  Doubt this is uremia as her renal function continues to improve.   PAD hx L BKA Morbid obesity DM2 on insulin HTN - BP's soft, holding home BP medications    Donetta Potts, MD [employer] [phone]

## 2021-10-24 NOTE — Progress Notes (Signed)
PT Cancellation Note  Patient Details Name: [name] MRN: [id number] DOB: 09-23-1976   Cancelled Treatment:    Reason Eval/Treat Not Completed: Pain limiting ability to participate this afternoon. Pt left with RN attempting to find solution to pain, PT will continue to follow and attempt to return as time/schedule allows.  West Carbo, PT, DPT   Acute Rehabilitation Department Pager #: [phone]   Sandra Cockayne 10/24/2021, 3:23 PM

## 2021-10-24 NOTE — Progress Notes (Signed)
Physical Therapy Wound Treatment Patient Details  Name: [name] MRN: [id number] Date of Birth: 1976-07-19  Today's Date: 10/24/2021 Time: 5883-2549 Time Calculation (min): 33 min  Subjective  Subjective Assessment Subjective: Pt reporting feeling nauseous Patient and Family Stated Goals: Heal wounds Date of Onset:  (Unknown) Prior Treatments:  (I&D 11/11)  Pain Score:  6/10 (premedicated)  Wound Assessment  Wound / Incision (Open or Dehisced) 10/20/21 Incision - Open Thigh Anterior;Proximal;Left Groin (Active)  Dressing Type ABD 10/24/21 1029  Dressing Changed Changed 10/24/21 1029  Dressing Status Clean;Dry;Intact 10/24/21 1029  Dressing Change Frequency Daily 10/24/21 1029  Site / Wound Assessment Yellow;Red 10/24/21 1029  % Wound base Red or Granulating 95% 10/23/21 1345  % Wound base Yellow/Fibrinous Exudate 5% 10/23/21 1345  % Wound base Black/Eschar 0% 10/23/21 1345  % Wound base Other/Granulation Tissue (Comment) 0% 10/23/21 1345  Peri-wound Assessment Maceration;Intact 10/24/21 1029  Wound Length (cm) 7.5 cm 10/20/21 1500  Wound Width (cm) 5 cm 10/20/21 1500  Wound Surface Area (cm^2) 37.5 cm^2 10/20/21 1500  Tunneling (cm) Wound tracks around with drain present and exiting out of both incision sites. Difficult to measure depth due to drains present 10/20/21 1500  Margins Unattached edges (unapproximated) 10/24/21 1029  Closure None 10/24/21 1029  Drainage Amount Minimal 10/24/21 1029  Drainage Description Serosanguineous 10/24/21 1029  Treatment Debridement (Selective);Hydrotherapy (Pulse lavage) 10/24/21 1029      Hydrotherapy Pulsed lavage therapy - wound location: L groin Pulsed Lavage with Suction (psi): 4 psi Pulsed Lavage with Suction - Normal Saline Used: 1000 mL Pulsed Lavage Tip: Tip with splash shield Selective Debridement Selective Debridement - Location: L groin Selective Debridement - Tools Used: Forceps, Scissors Selective Debridement - Tissue  Removed: yellow slough    Wound Assessment and Plan  Wound Therapy - Assess/Plan/Recommendations Wound Therapy - Clinical Statement: Pt benefiting most from PLS to flush the wound and decrease bioburden.Minimal debridement of yellow slough. Still remains difficult to visualize due to drain in place. 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 Factors Delaying/Impairing Wound Healing: Infection - systemic/local, Diabetes Mellitus Hydrotherapy Plan: Debridement, Dressing change, Patient/family education, Pulsatile lavage with suction Wound Therapy - Frequency: 6X / week Wound Therapy - Follow Up Recommendations: dressing changes by RN  Wound Therapy Goals- Improve the function of patient's integumentary system by progressing the wound(s) through the phases of wound healing (inflammation - proliferation - remodeling) by: Wound Therapy Goals - Improve the function of patient's integumentary system by progressing the wound(s) through the phases of wound healing by: Decrease Necrotic Tissue to: 0 Decrease Necrotic Tissue - Progress: Progressing toward goal Increase Granulation Tissue to: 100 Increase Granulation Tissue - Progress: Progressing toward goal Goals/treatment plan/discharge plan were made with and agreed upon by patient/family: Yes Time For Goal Achievement: 7 days Wound Therapy - Potential for Goals: Good  Goals will be updated until maximal potential achieved or discharge criteria met.  Discharge criteria: when goals achieved, discharge from hospital, MD decision/surgical intervention, no progress towards goals, refusal/missing three consecutive treatments without notification or medical reason.  GP     Charges PT Wound Care Charges $Wound Debridement up to 20 cm: < or equal to 20 cm $ Wound Debridement each add'l 20 sqcm: 1 $PT PLS Gun and Tip: 1 Supply $PT Hydrotherapy Visit: 1 Visit     Wyona Almas, PT, DPT Acute  Rehabilitation Services Pager [phone] Office [phone]   Deno Etienne 10/24/2021, 10:33 AM

## 2021-10-24 NOTE — Progress Notes (Signed)
Family Medicine Teaching Service Daily Progress Note Intern Pager: [phone]  Patient name: [name] Medical record number: [id number] Date of birth: 01-Mar-1976 Age: 45 y.o. Gender: female  Primary Care Provider: [name], MD Consultants: Nephrology, surgery (S/O) Code Status: Full  Pt Overview and Major Events to Date:  11/11-admitted, I&D 11/13-nephrology consulted 11/16-sedated mental status 11/18-mental status back to baseline  Assessment and Plan: [name] is a 45 y/o F who presented with left groin abscess, now status post I&D, and onset of acute renal failure.  Left groin abscess status post I&D (10/16/2021) Patient denies any pain on movement of pannus but states there is general pain around abscess of irritated.  No drainage noted on bandage.  We will continue leaving Penrose drain in place which will be removed at a clinic follow-up, per surgery recommendations. -Continue oral doxycycline and oral Augmentin for total antibiotic treatment of 14 days, per surgery recommendations -Tylenol 650 mg every 6 hours -Continue hydrotherapy while admitted -Dilaudid 1 mg IV as needed prior to hydrotherapy given cessation of other pain medications in this interim time.  Acute renal failure on CKD 3a; no longer anuric Serum creatinine continues to improve from 3.05-2.38. -Start p.o. torsemide 80 mg daily (home dosage) -Nephrology following, appreciate recs  AMS/sedation Resolved at this time.  Chronic pain Pt experiences chronic leg pain. Previously on Gabapentin 1200mg  TID. Still experienced leg pain with 300mg  gabapentin overnight -Gabapentin 600mg  qhs  Constipation Resolved at this time. -Adjust bowel regimen to senna qd and Miralax 17g BID  T2DM CBGs ranging from 168-243. Most recent 160.  -Adjusted Semglee to 35u BID -Continue NovoLog scheduled 20 units 3 times daily with meals and 0-5 Hx +6 units 3 times daily  Other conditions (HLD, GERD, OSA,  hypertension) chronic and stable.  FEN/GI: Carb modified PPx: Heparin every 8 hours Dispo:Home tomorrow. Barriers include acute renal failure.   Subjective:  Pt tired this morning because she did not sleep well. She endorses leg pain overnight which was mildly improved with Gabapentin. She notes she is still experiencing pain with her abscess site. Endorses continuing bowel movements. Denies abdominal pain.  Objective: Temp:  [97.6 F (36.4 C)-99.1 F (37.3 C)] 98.2 F (36.8 C) (11/19 0600) Pulse Rate:  [79-97] 79 (11/19 0600) Resp:  [13-19] 19 (11/18 2033) BP: (132-162)/(65-75) 132/65 (11/18 2033) SpO2:  [96 %-98 %] 96 % (11/18 2033) Physical Exam: General: tired, appropriate in conversation, NAD Cardiovascular: RRR, no murmurs auscultated Respiratory: CTAB, no increased work of breathing, wearing biPAP Abdomen: nontender, obese abdomen, abscess bandage appears dry  Laboratory: Recent Labs  Lab 10/19/21 0625 10/21/21 0606 10/22/21 0211  WBC 12.1* 9.6 11.1*  HGB 9.7* 9.3* 10.3*  HCT 30.3* 28.9* 32.1*  PLT 372 433* 511*   Recent Labs  Lab 10/18/21 0833 10/18/21 1223 10/22/21 0211 10/23/21 0219 10/24/21 0546  NA 127*   < > 134* 134* 138  K 3.6   < > 3.9 3.6 3.5  CL 92*   < > 100 100 101  CO2 21*   < > 22 23 27   BUN 38*   < > 56* 52* 49*  CREATININE 4.50*   < > 4.39* 3.05* 2.38*  CALCIUM 7.6*   < > 8.6* 8.6* 8.7*  PROT 7.1  --   --   --   --   BILITOT 0.4  --   --   --   --   ALKPHOS 187*  --   --   --   --  ALT 12  --   --   --   --   AST 13*  --   --   --   --   GLUCOSE 243*   < > 116* 151* 187*   < > = values in this interval not displayed.   CBG (last 3)  Recent Labs    10/23/21 2111 10/24/21 0052 10/24/21 0546  GLUCAP 243* 227* 184*   Imaging/Diagnostic Tests: No results found.  Wells Guiles, DO 10/24/2021, 7:04 AM PGY-1, [facility] Intern pager: [phone], text pages welcome

## 2021-10-25 DIAGNOSIS — N179 Acute kidney failure, unspecified: Secondary | ICD-10-CM | POA: Diagnosis not present

## 2021-10-25 DIAGNOSIS — L02214 Cutaneous abscess of groin: Secondary | ICD-10-CM | POA: Diagnosis not present

## 2021-10-25 DIAGNOSIS — I5030 Unspecified diastolic (congestive) heart failure: Secondary | ICD-10-CM | POA: Diagnosis not present

## 2021-10-25 DIAGNOSIS — E1165 Type 2 diabetes mellitus with hyperglycemia: Secondary | ICD-10-CM | POA: Diagnosis not present

## 2021-10-25 LAB — RENAL FUNCTION PANEL
Albumin: 2.2 g/dL — ABNORMAL LOW (ref 3.5–5.0)
Anion gap: 9 (ref 5–15)
BUN: 37 mg/dL — ABNORMAL HIGH (ref 6–20)
CO2: 26 mmol/L (ref 22–32)
Calcium: 8.7 mg/dL — ABNORMAL LOW (ref 8.9–10.3)
Chloride: 104 mmol/L (ref 98–111)
Creatinine, Ser: 1.44 mg/dL — ABNORMAL HIGH (ref 0.44–1.00)
GFR, Estimated: 46 mL/min — ABNORMAL LOW (ref >60)
Glucose, Bld: 177 mg/dL — ABNORMAL HIGH (ref 70–99)
Phosphorus: 3.4 mg/dL (ref 2.5–4.6)
Potassium: 3.7 mmol/L (ref 3.5–5.1)
Sodium: 139 mmol/L (ref 135–145)

## 2021-10-25 LAB — GLUCOSE, CAPILLARY
Glucose-Capillary: 160 mg/dL — ABNORMAL HIGH (ref 70–99)
Glucose-Capillary: 156 mg/dL — ABNORMAL HIGH (ref 70–99)
Glucose-Capillary: 196 mg/dL — ABNORMAL HIGH (ref 70–99)

## 2021-10-25 MED ORDER — TRESIBA FLEXTOUCH 200 UNIT/ML ~~LOC~~ SOPN: 45.0000 [IU] | PEN_INJECTOR | Freq: Two times a day (BID) | SUBCUTANEOUS | 3 refills | Status: DC

## 2021-10-25 MED ORDER — NOVOLOG FLEXPEN 100 UNIT/ML ~~LOC~~ SOPN: 20.0000 [IU] | PEN_INJECTOR | Freq: Three times a day (TID) | SUBCUTANEOUS | 3 refills | Status: DC

## 2021-10-25 MED ORDER — AMOXICILLIN-POT CLAVULANATE 875-125 MG PO TABS: 1.0000 | ORAL_TABLET | Freq: Two times a day (BID) | ORAL | 0 refills | Status: AC

## 2021-10-25 MED ORDER — DOXYCYCLINE HYCLATE 100 MG PO TABS
100.0000 mg | ORAL_TABLET | Freq: Two times a day (BID) | ORAL | 0 refills | Status: AC
Start: 2021-10-25 — End: 2021-11-01

## 2021-10-25 MED ORDER — GABAPENTIN 600 MG PO TABS: 600.0000 mg | ORAL_TABLET | Freq: Every day | ORAL | 4 refills | Status: DC

## 2021-10-25 NOTE — Discharge Summary (Signed)
[facility] Discharge Summary  Patient name: [name] Medical record number: [id number] Date of birth: September 13, 1976 Age: 45 y.o. Gender: female Date of Admission: 10/16/2021  Date of Discharge: 10/25/21 Admitting Physician: Lind Covert, MD  Primary Care Provider: [name], MD Consultants: Nephrology, general surgery   Indication for Hospitalization: Left groin abscess   Discharge Diagnoses/Problem List:  Left groin Abscess s/p I&D Acute on Chronic renal failure Chronic Pain  Constipation  Insulin Dependent Type II Diabetes  Hyperlipidemia  GERD Obstructive sleep apnea  Hypertension   Disposition: Home   Discharge Condition: Stable   Discharge Exam:   GEN: pleasant female, in no acute distress  CV: regular rate and rhythm, no murmurs appreciated  RESP: no increased work of breathing, clear to ascultation bilaterally ABD: Bowel sounds present. Soft, obese, non-tender, non-distended.  MSK:  left AKA, right ankle amputation SKIN: warm, dry, left groin bandage clean dry and intact     Brief Hospital Course:   [name] is a 45 year-old female who presented with left groin abscess.  PMH significant for T2DM, morbid obesity, obesity hypoventilation syndrome, OSA wears BiPAP at night, hypertension, asthma, GERD, hyperlipidemia, restless leg syndrome, left BKA, mood disorder, hypertension, history of chest pain with low risk for cardiac etiology.  Hospital course is outlined below:  Left groin abscess, s/p I&D Patient has history of multiple recurrent groin abscesses, with I&D required in the past.  Upon arrival to the ED, she was afebrile with leukocytosis to 16.7, otherwise unremarkable labs.  Vital signs were within normal limits and she was saturating well on room air.  CT pelvis showed left groin soft tissue collection with gas measuring at least 4.0 x 11.0 x 12.4 cm consistent with abscess.  General surgery was consulted  for incision and drainage. IV vancomycin and Zosyn x1 received in ED. Throughout abx course, received ceftriaxone, flagyl, IV doxycycline. Transitioned to Augmentin and oral doxycycline. Pain was managed with oxycodone and acetaminophen. Pt was discharged with additional 7 days to complete 14-day course of total abx therapy, per surgery recommendations.   Acute renal failure in setting of CKD stage IIIa Baseline creatinine 1.17 from 6 months prior, 1.52 on admission.  Patient had continual rise in creatinine up to 5.62 with GFR 9, BUN 48.  Patient not uremic or indicating urgent need for dialysis.  She became anuric. Nephrology was consulted and initiated on high-dose IV Lasix with good urinary output response. Upon discharge, pt was transitioned back to home dosed Torsemide 80mg  daily. Serum creatine on the day of discharge was 1.44.    Poorly controlled type 2 diabetes mellitus, DKA Glucose on admission 474.  Required Endo tool.  Anion gap closed, transition to long-acting insulin.  CBGs eventually well controlled 70s-130s range. Patient discharged with 40 units BID of long acting insulin and 20 units TID short acting insulin.   AMS/Sedation Pt experienced change in baseline response level.  Hypoglycemia, metabolic disturbance, stroke/seizure ruled unlikely.  CT head negative.  Patient presenting with flapping of arms and active motion of arms above head but endorsed the symptoms prior to hospitalization.  Sedative medications of oxycodone and gabapentin discontinued at that time as suspicious of gabapentin sedation in the setting of renal dysfunction. Upon discharge, patient was alert and oriented. She was discharged with 600 mg Gabapentin at bedtime.   Home medications were continued for other chronic diseases which remained stable.    Issues for Follow Up:  T2DM: PCP may consider Mounjaro  for T2DM with high insulin requirement and would benefit from weight loss.  T2DM: Consider connecting with  clinical pharmacists for additional diabetes management.  Recheck serum creatine at follow up  Titrate Gabapentin as needed  Per surgery: continue leaving Penrose drain in place which will be removed at a surgical clinic follow-up, continue oral doxycycline and oral Augmentin for total antibiotic treatment of 14 days.   Significant Procedures:   Significant Labs and Imaging:  Recent Labs  Lab 10/19/21 0625 10/21/21 0606 10/22/21 0211  WBC 12.1* 9.6 11.1*  HGB 9.7* 9.3* 10.3*  HCT 30.3* 28.9* 32.1*  PLT 372 433* 511*   Recent Labs  Lab 10/21/21 0606 10/22/21 0211 10/23/21 0219 10/24/21 0546 10/25/21 0610  NA 130* 134* 134* 138 139  K 4.1 3.9 3.6 3.5 3.7  CL 98 100 100 101 104  CO2 20* 22 23 27 26   GLUCOSE 260* 116* 151* 187* 177*  BUN 56* 56* 52* 49* 37*  CREATININE 5.17* 4.39* 3.05* 2.38* 1.44*  CALCIUM 8.1* 8.6* 8.6* 8.7* 8.7*  MG 1.7 1.8  --   --   --   PHOS  --   --   --   --  3.4  ALBUMIN  --   --   --   --  2.2*    CT PELVIS W CONTRAST  Result Date: 10/16/2021 CLINICAL DATA:  Abdominal abscess/infection suspected. EXAM: CT PELVIS WITH CONTRAST TECHNIQUE: Multidetector CT imaging of the pelvis was performed using the standard protocol following the bolus administration of intravenous contrast. CONTRAST:  150mL OMNIPAQUE IOHEXOL 350 MG/ML SOLN COMPARISON:  CT examination dated April 03, 2021 FINDINGS: Urinary Tract:  No abnormality visualized. Bowel:  Unremarkable visualized pelvic bowel loops. Vascular/Lymphatic: No pathologically enlarged lymph nodes. No significant vascular abnormality seen. Reproductive:  No mass or other significant abnormality Skin/soft tissues: There is a peripherally enhancing soft tissue collection with gas in the subcutaneous soft tissues of the left groin measuring at least 4.0 x 11.0 x 12.4 cm, consistent with abscess. Musculoskeletal: No suspicious bone lesions identified. IMPRESSION: Left groin soft tissue collection with gas measuring at  least 4.0 x 11.0 x 12.4 cm consistent with abscess. Surgical consultation for further management is recommended. Electronically Signed   By: Keane Police D.O.   On: 10/16/2021 14:24   DG Chest Portable 1 View  Result Date: 10/16/2021 CLINICAL DATA:  Chest pain. EXAM: PORTABLE CHEST 1 VIEW COMPARISON:  May 05, 2021. FINDINGS: The heart size and mediastinal contours are within normal limits. Both lungs are clear. The visualized skeletal structures are unremarkable. IMPRESSION: No active disease. Electronically Signed   By: Marijo Conception M.D.   On: 10/16/2021 09:47     Results/Tests Pending at Time of Discharge:   Discharge Medications:  Allergies as of 10/25/2021       Reactions   Cefepime Other (See Comments)   AKI, see records from [facility] hospitalization in January 2020.   Other reaction(s): Other (See Comments) Note pt has tolerated Rocephin and Keflex   Kiwi Extract Shortness Of Breath, Swelling, Anaphylaxis   Morphine And Related Nausea And Vomiting   Nalbuphine    Other reaction(s): Hallucinations, Other (See Comments) "FEELS LIKE SOMETHING CRAWLING ON ME" Reaction:  Nervousness "FEELS LIKE SOMETHING CRAWLING ON ME" Reaction:  Nervousness Reaction:  Nervousness "FEELS LIKE SOMETHING CRAWLING ON ME"   Trental [pentoxifylline] Nausea And Vomiting   Toradol [ketorolac Tromethamine] Other (See Comments)   Feels like something is crawling on me  Morphine Nausea And Vomiting   Nubain [nalbuphine Hcl] Other (See Comments)   "FEELS LIKE SOMETHING CRAWLING ON ME"        Medication List     STOP taking these medications    ramelteon 8 MG tablet Commonly known as: ROZEREM       TAKE these medications    Accu-Chek Softclix Lancets lancets Use to check blood glucose four times daily   Accu-Chek FastClix Lancets Misc USE DAILY AS INSTRUCTED   albuterol 108 (90 Base) MCG/ACT inhaler Commonly known as: VENTOLIN HFA INHALE 1 TO 2 PUFFS BY MOUTH EVERY SIX HOURS AS NEEDED  FOR WHEEZING OR SHORTNESS OF BREATH What changed: See the new instructions.   amLODipine 10 MG tablet Commonly known as: NORVASC Take 1 tablet by mouth daily.   amoxicillin-clavulanate 875-125 MG tablet Commonly known as: Augmentin Take 1 tablet by mouth 2 (two) times daily for 7 days.   aspirin EC 81 MG tablet Take 1 tablet (81 mg total) by mouth daily. Swallow whole.   atorvastatin 80 MG tablet Commonly known as: LIPITOR Take 1 tablet (80 mg total) by mouth daily.   Cholecalciferol 25 MCG (1000 UT) tablet Take 1 tablet (1,000 Units total) by mouth daily.   Dexcom G6 Sensor Misc Inject 1 applicator into the skin as directed. Change sensor every 10 days.   Dexcom G6 Transmitter Misc Inject 1 Device into the skin as directed. Reuse 8 times with sensor changes.   doxycycline 100 MG tablet Commonly known as: VIBRA-TABS Take 1 tablet (100 mg total) by mouth every 12 (twelve) hours for 7 days.   gabapentin 600 MG tablet Commonly known as: NEURONTIN Take 1 tablet (600 mg total) by mouth at bedtime. What changed:  how much to take when to take this   GNP UltiCare Pen Needles 32G X 4 MM Misc Generic drug: Insulin Pen Needle USE TO inject insulin 2 TIMES DAILY   Iron 325 (65 Fe) MG Tabs Take 1 tablet (325 mg total) by mouth every other day.   metoprolol succinate 25 MG 24 hr tablet Commonly known as: TOPROL-XL Take 25 mg by mouth daily.   NovoLOG FlexPen 100 UNIT/ML FlexPen Generic drug: insulin aspart Inject 20 Units into the skin 3 (three) times daily with meals. What changed: See the new instructions.   omeprazole 40 MG capsule Commonly known as: PRILOSEC Take 1 capsule (40 mg total) by mouth daily.   ondansetron 4 MG disintegrating tablet Commonly known as: ZOFRAN-ODT Dissolve 1 tablet under the tongue every 8 hours as needed for nausea. What changed: See the new instructions.   oxyCODONE-acetaminophen 7.5-325 MG tablet Commonly known as: PERCOCET Take 1  tablet by mouth every 8 (eight) hours as needed for severe pain. What changed: Another medication with the same name was removed. Continue taking this medication, and follow the directions you see here.   potassium chloride SA 20 MEQ tablet Commonly known as: Klor-Con M20 Take 1 tablet (20 mEq total) by mouth daily.   rOPINIRole 1 MG tablet Commonly known as: REQUIP Take 1 tablet (1 mg total) by mouth at bedtime.   Spiriva Respimat 1.25 MCG/ACT Aers Generic drug: Tiotropium Bromide Monohydrate Inhale 2 puffs into the lungs daily.   Symbicort 160-4.5 MCG/ACT inhaler Generic drug: budesonide-formoterol INHALE 2 PUFFS INTO THE LUNGS 2 TIMES DAILY   torsemide 20 MG tablet Commonly known as: DEMADEX Take 4 tablets (80 mg total) by mouth daily.   Industrial/product designer Use daily   Tyler Aas  FlexTouch 200 UNIT/ML FlexTouch Pen Generic drug: insulin degludec Inject 46 Units into the skin 2 (two) times daily. What changed: how much to take   Trulicity 1.5 WC/3.7SE Sopn Generic drug: Dulaglutide inject 1.5mg  into THE SKIN ONCE A WEEK What changed: See the new instructions.               Durable Medical Equipment  (From admission, onward)           Start     Ordered   10/23/21 1432  For home use only DME Bedside commode  Once       Question:  Patient needs a bedside commode to treat with the following condition  Answer:  Shortness of breath   10/23/21 1432              Discharge Care Instructions  (From admission, onward)           Start     Ordered   10/25/21 0000  Discharge wound care:       Comments: See AVS   10/25/21 1400            Discharge Instructions: Please refer to Patient Instructions section of EMR for full details.  Patient was counseled important signs and symptoms that should prompt return to medical care, changes in medications, dietary instructions, activity restrictions, and follow up appointments.   Follow-Up Appointments:   Follow-up Information     Surgery, [place]. Go on 11/12/2021.   Specialty: General Surgery Why: follow up on 11/12/21  at 3:30 pm, please arrive 30 min early to complete the check in process and bring photo ID and insurance card Contact information: [place] [id number] [phone]         [name], MD. Go on 10/28/2021.   Specialty: Family Medicine Why: at 8:50 AM Contact information: [place] [address] [phone]         Sherren Mocha, MD .   Specialty: Cardiology Contact information: [address] [address] [phone]         Sueanne Margarita, MD .   Specialty: Cardiology Contact information: [phone] [address] [address] [phone]                 Lyndee Hensen, DO 10/25/2021 PGY-3, [place]

## 2021-10-25 NOTE — Progress Notes (Signed)
Patient ID: [name], female   DOB: 01-Apr-1976, 45 y.o.   MRN: [id number] S: No events overnight O:BP (!) 141/63 (BP Location: Right Arm)   Pulse 83   Temp 97.7 F (36.5 C) (Oral)   Resp 10   Ht '5\' 2"'  (1.575 m)   Wt (!) 154.2 kg   SpO2 99%   BMI 62.18 kg/m   Intake/Output Summary (Last 24 hours) at 10/25/2021 1106 Last data filed at 10/25/2021 0600 Gross per 24 hour  Intake --  Output 550 ml  Net -550 ml   Intake/Output: I/O last 3 completed shifts: In: -  Out: 550 [Urine:550]  Intake/Output this shift:  No intake/output data recorded. Weight change:  Gen: NAD CVS: RRR Resp:CTA Abd: obese, +BS, soft, NT/ND Ext: no edema,s/p left BKA, s/p right TMA  Recent Labs  Lab 10/19/21 1230 10/20/21 0430 10/21/21 0606 10/22/21 0211 10/23/21 0219 10/24/21 0546 10/25/21 0610  NA 125* 126* 130* 134* 134* 138 139  K 4.2 4.1 4.1 3.9 3.6 3.5 3.7  CL 92* 94* 98 100 100 101 104  CO2 19* 18* 20* '22 23 27 26  ' GLUCOSE 325* 247* 260* 116* 151* 187* 177*  BUN 45* 49* 56* 56* 52* 49* 37*  CREATININE 5.59* 5.62* 5.17* 4.39* 3.05* 2.38* 1.44*  ALBUMIN  --   --   --   --   --   --  2.2*  CALCIUM 7.7* 7.7* 8.1* 8.6* 8.6* 8.7* 8.7*  PHOS  --   --   --   --   --   --  3.4   Liver Function Tests: Recent Labs  Lab 10/25/21 0610  ALBUMIN 2.2*   No results for input(s): LIPASE, AMYLASE in the last 168 hours. Recent Labs  Lab 10/21/21 1126  AMMONIA 21   CBC: Recent Labs  Lab 10/19/21 0625 10/21/21 0606 10/22/21 0211  WBC 12.1* 9.6 11.1*  HGB 9.7* 9.3* 10.3*  HCT 30.3* 28.9* 32.1*  MCV 95.6 93.8 94.4  PLT 372 433* 511*   Cardiac Enzymes: No results for input(s): CKTOTAL, CKMB, CKMBINDEX, TROPONINI in the last 168 hours. CBG: Recent Labs  Lab 10/24/21 1238 10/24/21 1602 10/24/21 2007 10/25/21 0613 10/25/21 0802  GLUCAP 174* 250* 238* 160* 156*    Iron Studies: No results for input(s): IRON, TIBC, TRANSFERRIN, FERRITIN in the last 72 hours. Studies/Results: No  results found.  acetaminophen  650 mg Oral Q6H   Or   acetaminophen  650 mg Rectal Q6H   amoxicillin-clavulanate  1 tablet Oral BID WC   atorvastatin  80 mg Oral q1800   chlorhexidine  15 mL Mouth/Throat Once   Or   mouth rinse  15 mL Mouth Rinse Once   Chlorhexidine Gluconate Cloth  6 each Topical Daily   doxycycline  100 mg Oral Q12H   ferrous sulfate  325 mg Oral QODAY   heparin  5,000 Units Subcutaneous Q8H   insulin aspart  0-20 Units Subcutaneous TID WC   insulin aspart  0-5 Units Subcutaneous QHS   insulin aspart  6 Units Subcutaneous TID WC   insulin glargine-yfgn  35 Units Subcutaneous BID   nystatin   Topical BID   ondansetron (ZOFRAN) IV  4 mg Intravenous Once   pantoprazole  40 mg Oral QHS   polyethylene glycol  17 g Oral BID   senna  1 tablet Oral Daily   torsemide  80 mg Oral Daily    BMET    Component Value Date/Time   NA  139 10/25/2021 0610   NA 139 04/24/2021 1119   K 3.7 10/25/2021 0610   CL 104 10/25/2021 0610   CO2 26 10/25/2021 0610   GLUCOSE 177 (H) 10/25/2021 0610   BUN 37 (H) 10/25/2021 0610   BUN 49 (H) 04/24/2021 1119   CREATININE 1.44 (H) 10/25/2021 0610   CREATININE 0.51 08/05/2015 1114   CALCIUM 8.7 (L) 10/25/2021 0610   GFRNONAA 46 (L) 10/25/2021 0610   GFRAA 77 12/09/2020 1605   CBC    Component Value Date/Time   WBC 11.1 (H) 10/22/2021 0211   RBC 3.40 (L) 10/22/2021 0211   HGB 10.3 (L) 10/22/2021 0211   HGB 12.4 04/24/2021 1119   HCT 32.1 (L) 10/22/2021 0211   HCT 39.7 04/24/2021 1119   PLT 511 (H) 10/22/2021 0211   PLT 453 (H) 04/24/2021 1119   MCV 94.4 10/22/2021 0211   MCV 96 04/24/2021 1119   MCH 30.3 10/22/2021 0211   MCHC 32.1 10/22/2021 0211   RDW 14.6 10/22/2021 0211   RDW 13.6 04/24/2021 1119   LYMPHSABS 2.8 04/27/2021 0632   LYMPHSABS 4.5 (H) 09/19/2018 0928   MONOABS 1.0 04/27/2021 0632   EOSABS 0.2 04/27/2021 0632   EOSABS 0.2 09/19/2018 0928   BASOSABS 0.0 04/27/2021 0632   BASOSABS 0.1 09/19/2018 0928     Assessment/ Plan: AKI on CKD 3a - b/l creat from May 2022 is 1.02- 1.37, eGFR 49- >60 ml/min. Creat here 1.5 on admission 3 days ago and creat up to 4.1/ 4.6 today in setting of admission for L groin abscess, now sp I&D. Pt is vol overloaded, 9 L + in 2 days. UA looks infected, renal US w/o obstruction. No shock. AKI likely ATN due to contrast injury +/- IV abx (vanc) toxicity. Made urine yesterday but not much today.  Scr continues to improve and is near baseline Off IV lasix and on home po torsemide 80 mg daily.  Nothing further to add.  Will sign off.  Can have pt f/u in 1 month after discharge if her Scr does not normalize. L groin abscess - vanc/ zosyn/ flagyl dc'd, getting IV doxy/ Rocephin now. +wound culture prevotella bivia/beta lactamase +.  For 14 days of abx per surgery. AMS - has been progressive over the past 2 days and some concern about shaking movements of her arms.  Possible EEG per primary team.  Doubt this is uremia as her renal function continues to improve.   PAD hx L BKA Morbid obesity DM2 on insulin HTN - BP's soft, holding home BP medications  Donetta Potts, MD [employer] [phone]

## 2021-10-25 NOTE — Progress Notes (Signed)
Occupational Therapy Treatment Patient Details Name: [name] MRN: [id number] DOB: [birth year] Today's Date: 10/25/2021   History of present illness 45 y.o. female who presented 10/16/21 with left groin abscess, hyperglycemia, and chest pain. S/p I&D of L inguinal abscess. PMH: type 2 diabetes, morbid obesity, obesity hypoventilation syndrome, OSA wears BiPAP at night, hypertension, asthma, GERD, hyperlipidemia, restless leg syndrome, left BKA, R transmetatarsal amputation, mood disorder, hypertension, history of chest pain   OT comments  [name] is progressing incrementally with plans to d/c home today. She completed dressing and toileting this session and reports she is near her baseline. She required max Belinda to don her prosthetic, max Belinda for lower body dressing, and set up for UB dressing. She was able to transfer from bed>BSC with min Belinda and max verbal cues for safety and WB status, and required total Belinda for pericare in standing after BM. Pt ws educated on toilet aide to assist her with increased indep for hygiene. D/c plans remain appropriate.    Recommendations for follow up therapy are one component of Belinda multi-disciplinary discharge planning process, led by the attending physician.  Recommendations may be updated based on patient status, additional functional criteria and insurance authorization.    Follow Up Recommendations  Home health OT    Assistance Recommended at Discharge Intermittent Supervision/Assistance  Equipment Recommendations  None recommended by OT       Precautions / Restrictions Precautions Precautions: Fall Restrictions Weight Bearing Restrictions: Yes RLE Weight Bearing: Partial weight bearing       Mobility Bed Mobility               General bed mobility comments: pt sitting EOB upon arrival, returned to sitting    Transfers Overall transfer level: Needs assistance Equipment used: Rolling walker (2 wheels) Transfers: Sit to/from Stand;Bed to  chair/wheelchair/BSC Sit to Stand: Min assist Stand pivot transfers: Min assist         General transfer comment: used BSC to pull herself up despite safety concersn and education, min Belinda for WB     Balance Overall balance assessment: Needs assistance Sitting-balance support: Single extremity supported Sitting balance-Leahy Scale: Good     Standing balance support: Bilateral upper extremity supported Standing balance-Leahy Scale: Poor                             ADL either performed or assessed with clinical judgement   ADL Overall ADL's : Needs assistance/impaired                 Upper Body Dressing : Set up;Sitting   Lower Body Dressing: Maximal assistance;Sit to/from stand   Toilet Transfer: Minimal assistance;BSC/3in1;Stand-pivot Armed forces technical officer Details (indicate cue type and reason): min Belinda to maintain WB precautions Toileting- Clothing Manipulation and Hygiene: Total assistance;Sit to/from stand Toileting - Clothing Manipulation Details (indicate cue type and reason): total Belinda fro peri care after BM. educated pt on toilet aide on [employer]     Functional mobility during ADLs: Minimal assistance General ADL Comments: pt motivated to participate today becuase she is going home. Pt required max Belinda to don prothesis, and LB clothing. Had to give max vc for WB precautions however pt ignoring throughout    Extremity/Trunk Assessment Upper Extremity Assessment Upper Extremity Assessment: Overall WFL for tasks assessed;Generalized weakness   Lower Extremity Assessment Lower Extremity Assessment: Defer to PT evaluation        Vision   Vision Assessment?: No apparent  visual deficits          Cognition Arousal/Alertness: Awake/alert Behavior During Therapy: WFL for tasks assessed/performed Overall Cognitive Status: No family/caregiver present to determine baseline cognitive functioning                                 General Comments: Pt  has poor insight into safety and precautions                General Comments VSS on RA    Pertinent Vitals/ Pain       Pain Assessment: No/denies pain   Frequency  Min 3X/week        Progress Toward Goals  OT Goals(current goals can now be found in the care plan section)  Progress towards OT goals: Progressing toward goals  Acute Rehab OT Goals Patient Stated Goal: home OT Goal Formulation: With patient Time For Goal Achievement: 11/02/21 Potential to Achieve Goals: Good ADL Goals Pt Will Perform Lower Body Dressing: with min assist;sit to/from stand;sitting/lateral leans;with adaptive equipment Pt Will Transfer to Toilet: with min assist;bedside commode;stand pivot transfer Pt Will Perform Toileting - Clothing Manipulation and hygiene: with min assist;sitting/lateral leans;sit to/from stand;with adaptive equipment  Plan Discharge plan remains appropriate    Co-evaluation                 AM-PAC OT "6 Clicks" Daily Activity     Outcome Measure   Help from another person eating meals?: Belinda Little Help from another person taking care of personal grooming?: Belinda Little Help from another person toileting, which includes using toliet, bedpan, or urinal?: Belinda Lot Help from another person bathing (including washing, rinsing, drying)?: Belinda Lot Help from another person to put on and taking off regular upper body clothing?: Belinda Little Help from another person to put on and taking off regular lower body clothing?: Belinda Lot 6 Click Score: 15    End of Session Equipment Utilized During Treatment: Rolling walker (2 wheels) (BSC)  OT Visit Diagnosis: Unsteadiness on feet (R26.81);Other abnormalities of gait and mobility (R26.89);Muscle weakness (generalized) (M62.81)   Activity Tolerance Patient tolerated treatment well   Patient Left in bed;with call bell/phone within reach   Nurse Communication Mobility status        Time: 7591-6384 OT Time Calculation (min): 27  min  Charges: OT General Charges $OT Visit: 1 Visit OT Treatments $Self Care/Home Management : 8-22 mins    [name] [name] 10/25/2021, 3:16 PM

## 2021-10-25 NOTE — Progress Notes (Signed)
Pt blaced on bipap for the night

## 2021-10-25 NOTE — Plan of Care (Signed)
  Problem: Health Behavior/Discharge Planning: Goal: Ability to manage health-related needs will improve Outcome: Progressing   Problem: Clinical Measurements: Goal: Ability to maintain clinical measurements within normal limits will improve Outcome: Progressing   Problem: Clinical Measurements: Goal: Cardiovascular complication will be avoided Outcome: Progressing   Problem: Clinical Measurements: Goal: Respiratory complications will improve Outcome: Progressing   Problem: Activity: Goal: Risk for activity intolerance will decrease Outcome: Progressing   Problem: Nutrition: Goal: Adequate nutrition will be maintained Outcome: Progressing   Problem: Coping: Goal: Level of anxiety will decrease Outcome: Progressing   Problem: Elimination: Goal: Will not experience complications related to urinary retention Outcome: Progressing   Problem: Pain Managment: Goal: General experience of comfort will improve Outcome: Progressing   Problem: Safety: Goal: Ability to remain free from injury will improve Outcome: Progressing   Problem: Skin Integrity: Goal: Risk for impaired skin integrity will decrease Outcome: Progressing

## 2021-10-25 NOTE — Plan of Care (Signed)

## 2021-10-28 ENCOUNTER — Inpatient Hospital Stay: Payer: Medicaid Other

## 2021-11-02 ENCOUNTER — Telehealth: Payer: Self-pay | Admitting: Family Medicine

## 2021-11-02 NOTE — Telephone Encounter (Signed)
**  After Hours/ Emergency Line Call**  Received a page to call [name].  Pt states her sugars are registering "High" for the last hour or so. She took her insulin today (46 u [place] today, 50 Novolog about an hour ago). She states her sugars have been reading "high" for the past two days.   Given the patient has tried to bring her blood sugars down at home advised her to go to the ED.   Lyndee Hensen, DO PGY-3, [facility] 11/02/2021

## 2021-11-03 ENCOUNTER — Other Ambulatory Visit: Payer: Self-pay | Admitting: Family Medicine

## 2021-11-03 ENCOUNTER — Other Ambulatory Visit: Payer: Self-pay | Admitting: Pharmacist

## 2021-11-03 NOTE — Progress Notes (Signed)
    SUBJECTIVE:   CHIEF COMPLAINT / HPI:   Hospitalized 11/11-11/20 for left groin abscess. Underwent I&D via general surgery. Discharged with 7 days of antibiotics for total 14-day course. Hospital course complicated by acute renal failure on CKD IIIa, diuresed, discharged on torsemide 80 mg daily, Cr on discharge 1.44. Also DKA requiring insulin gtt. Notably she had a follow-up visit with endocrinology yesterday and medications were adjusted.  Since hospital discharge, she reports feeling good. She has noticed some pus drainage around the incision site but feels it is overall improving. Still having some pain at the site. Completed her antibiotic course. No fever or chills. Asking for pain medication refill until February. She has appointment at pain management next month.  Discharge follow-up recommendations T2DM: PCP may consider Mounjaro for T2DM with high insulin requirement and would benefit from weight loss.  T2DM: Consider connecting with clinical pharmacists for additional diabetes management.  Recheck serum creatine at follow up  Titrate Gabapentin as needed  Per surgery: continue leaving Penrose drain in place which will be removed at a surgical clinic follow-up, continue oral doxycycline and oral Augmentin for total antibiotic treatment of 14 days.   PERTINENT  PMH / PSH: CHF, T2DM, CKDIIIa  OBJECTIVE:   BP 113/70   Pulse 91   Ht 5\' 2"  (1.575 m)   LMP 10/16/2021   SpO2 97%   BMI 62.18 kg/m   General: obese female seated in wheelchair, NAD CV: RRR, no murmurs Pulm: CTAB, no wheezes or rales Derm: chaperone present. Scant amount of pus surrounding incision site at left groin without erythema. Drain in place.    ASSESSMENT/PLAN:   Abscess of groin, left Some pus at the incision site but appears to be healing well otherwise. Antibiotics completed. Drain in place. Has surgery follow-up next week. Return precautions given.  Stage 3a chronic kidney disease (CKD)  ([place]) Re-checking kidney function per discharge recommendations.   Staff message sent to PCP regarding pain medications  F/u 1 month  Zola Button, MD [place]

## 2021-11-03 NOTE — Patient Instructions (Addendum)
It was nice seeing you today!  Wound looks good. Call if you develop fever or chills.  Labs today, results will be available on MyChart.  Follow-up in 1 month with myself or Dr. Owens Shark.  Please arrive at least 15 minutes prior to your scheduled appointments.  Stay well, Zola Button, MD [place] [phone]

## 2021-11-04 ENCOUNTER — Encounter: Payer: Self-pay | Admitting: Family Medicine

## 2021-11-04 ENCOUNTER — Ambulatory Visit (INDEPENDENT_AMBULATORY_CARE_PROVIDER_SITE_OTHER): Payer: Medicaid Other | Admitting: Family Medicine

## 2021-11-04 ENCOUNTER — Other Ambulatory Visit: Payer: Self-pay

## 2021-11-04 VITALS — BP 113/70 | HR 91 | Ht 62.0 in

## 2021-11-04 DIAGNOSIS — Z09 Encounter for follow-up examination after completed treatment for conditions other than malignant neoplasm: Secondary | ICD-10-CM | POA: Diagnosis present

## 2021-11-04 DIAGNOSIS — L02214 Cutaneous abscess of groin: Secondary | ICD-10-CM

## 2021-11-04 DIAGNOSIS — N1831 Chronic kidney disease, stage 3a: Secondary | ICD-10-CM | POA: Diagnosis not present

## 2021-11-04 NOTE — Assessment & Plan Note (Signed)
Re-checking kidney function per discharge recommendations.

## 2021-11-04 NOTE — Assessment & Plan Note (Signed)
Some pus at the incision site but appears to be healing well otherwise. Antibiotics completed. Drain in place. Has surgery follow-up next week. Return precautions given.

## 2021-11-05 ENCOUNTER — Telehealth: Payer: Self-pay | Admitting: Cardiovascular Disease

## 2021-11-05 ENCOUNTER — Telehealth: Payer: Self-pay | Admitting: Family Medicine

## 2021-11-05 LAB — BASIC METABOLIC PANEL
BUN/Creatinine Ratio: 38 — ABNORMAL HIGH (ref 9–23)
BUN: 53 mg/dL — ABNORMAL HIGH (ref 6–24)
CO2: 27 mmol/L (ref 20–29)
Calcium: 9.3 mg/dL (ref 8.7–10.2)
Chloride: 89 mmol/L — ABNORMAL LOW (ref 96–106)
Creatinine, Ser: 1.38 mg/dL — ABNORMAL HIGH (ref 0.57–1.00)
Glucose: 632 mg/dL (ref 70–99)
Potassium: 5.1 mmol/L (ref 3.5–5.2)
Sodium: 131 mmol/L — ABNORMAL LOW (ref 134–144)
eGFR: 49 mL/min/{1.73_m2} — ABNORMAL LOW (ref >59)

## 2021-11-05 NOTE — Telephone Encounter (Signed)
**  After Hours/ Emergency Line Call**  Received a page from [facility] to report critical lab value. Called [phone]). Reported critical glucose level at 632. Called Baker Pierini and discussed lab abnormalities. She states that she feels at her baseline and denies any shortness of breath, nausea, vomiting, abdominal pain. She was recently seen by her Endocrinologist on 11/29 at was advised to increase her Trulicity to 3 mg weekly, increase Tresiba to 60U BID and continue taking Novolog 50U with each meal. She reports compliance with this. CBG at that office visit was 360. Endocrinologist reports goal CBG is 200-300. Anion gap is 15. Discussed recommendations regarding ED evaluation vs giving more short acting insulin at home to bring down her glucose. She is amenable to taking 25U Novolog and if sugars continue to be elevated >400, she agrees to seek evaluation.  Red flags discussed.  Will forward to PCP.  Sharion Settler, DO PGY-2, [facility] 11/05/2021 4:08 AM

## 2021-11-05 NOTE — Telephone Encounter (Signed)
**  After Hours/ Emergency Line Call**  Received a call back to report that Baker Pierini rechecked her sugar and it was 360. She had not yet taken the Novolog. She states that her sugars normally run in the 300s. She believes her sugar was elevated in the office because she had drank some orange juice prior to coming in. I advised her to hold off on taking any additional Novolog. She can resume her regular insulin regimen in the morning.  Will forward to PCP.  Sharion Settler, DO PGY-2, [facility] 11/05/2021 4:45 AM

## 2021-11-06 ENCOUNTER — Telehealth: Payer: Self-pay | Admitting: Family Medicine

## 2021-11-06 NOTE — Telephone Encounter (Signed)
-----   Message from Zola Button, MD sent at 11/04/2021 10:55 AM EST ----- Regarding: Pain medications FYI I saw your patient today, she was wondering if you can refill her pain medications until February. She has pain management appt next month.

## 2021-11-06 NOTE — Telephone Encounter (Signed)
Patient should have an rx already on file with her pharmacy as I last prescribed on 10/25 and sent in 2 months worth. Will hold on additional refills for now as she is seeing pain management within the next 2 weeks. I will be on maternity leave but faculty covering my inbox can manage refills until I return.  [name], MD

## 2021-11-11 ENCOUNTER — Other Ambulatory Visit: Payer: Self-pay | Admitting: *Deleted

## 2021-11-11 DIAGNOSIS — B3731 Acute candidiasis of vulva and vagina: Secondary | ICD-10-CM

## 2021-11-11 DIAGNOSIS — N1831 Chronic kidney disease, stage 3a: Secondary | ICD-10-CM

## 2021-11-11 MED ORDER — TERCONAZOLE 0.4 % VA CREA: 1.0000 | TOPICAL_CREAM | Freq: Every day | VAGINAL | 0 refills | Status: DC

## 2021-11-11 NOTE — Telephone Encounter (Signed)
Patient states that she has a sore place under her stomach and it smells "yeasty".  She would like a refill on her terconazole that has worked very well in the past.  She wanted to make an appt to see Owens Shark only but there were no openings until January.  Patient is also requesting a referral to nephrology.  She said they consulted with her in the hospital but no referral had been placed.  Will forward to MD to advise. Shilpa Bushee,CMA

## 2021-11-11 NOTE — Telephone Encounter (Signed)
Called patient referral placed for nephrology for CKD 3A.  Also sent in refill on medication for yeast infection in vagina (confirmed vaginal itching no fevers or skin breakdown).   Discussed skin breakdown in skin fold area. Discussed reasons to call and return to care. She will send photo.   Dorris Singh, MD  Family Medicine Teaching Service

## 2021-11-12 ENCOUNTER — Ambulatory Visit: Payer: Medicaid Other | Admitting: Internal Medicine

## 2021-11-13 ENCOUNTER — Encounter: Payer: Medicaid Other | Admitting: Physical Medicine and Rehabilitation

## 2021-11-18 IMAGING — DX DG CHEST 1V PORT
1 series · 1 of 1 positions shown · non-contrast
Comparison: Radiograph 07/18/2020, CT 06/14/2020

CLINICAL DATA: Chest pain, shortness of breath

EXAM:
PORTABLE CHEST 1 VIEW

[chest]
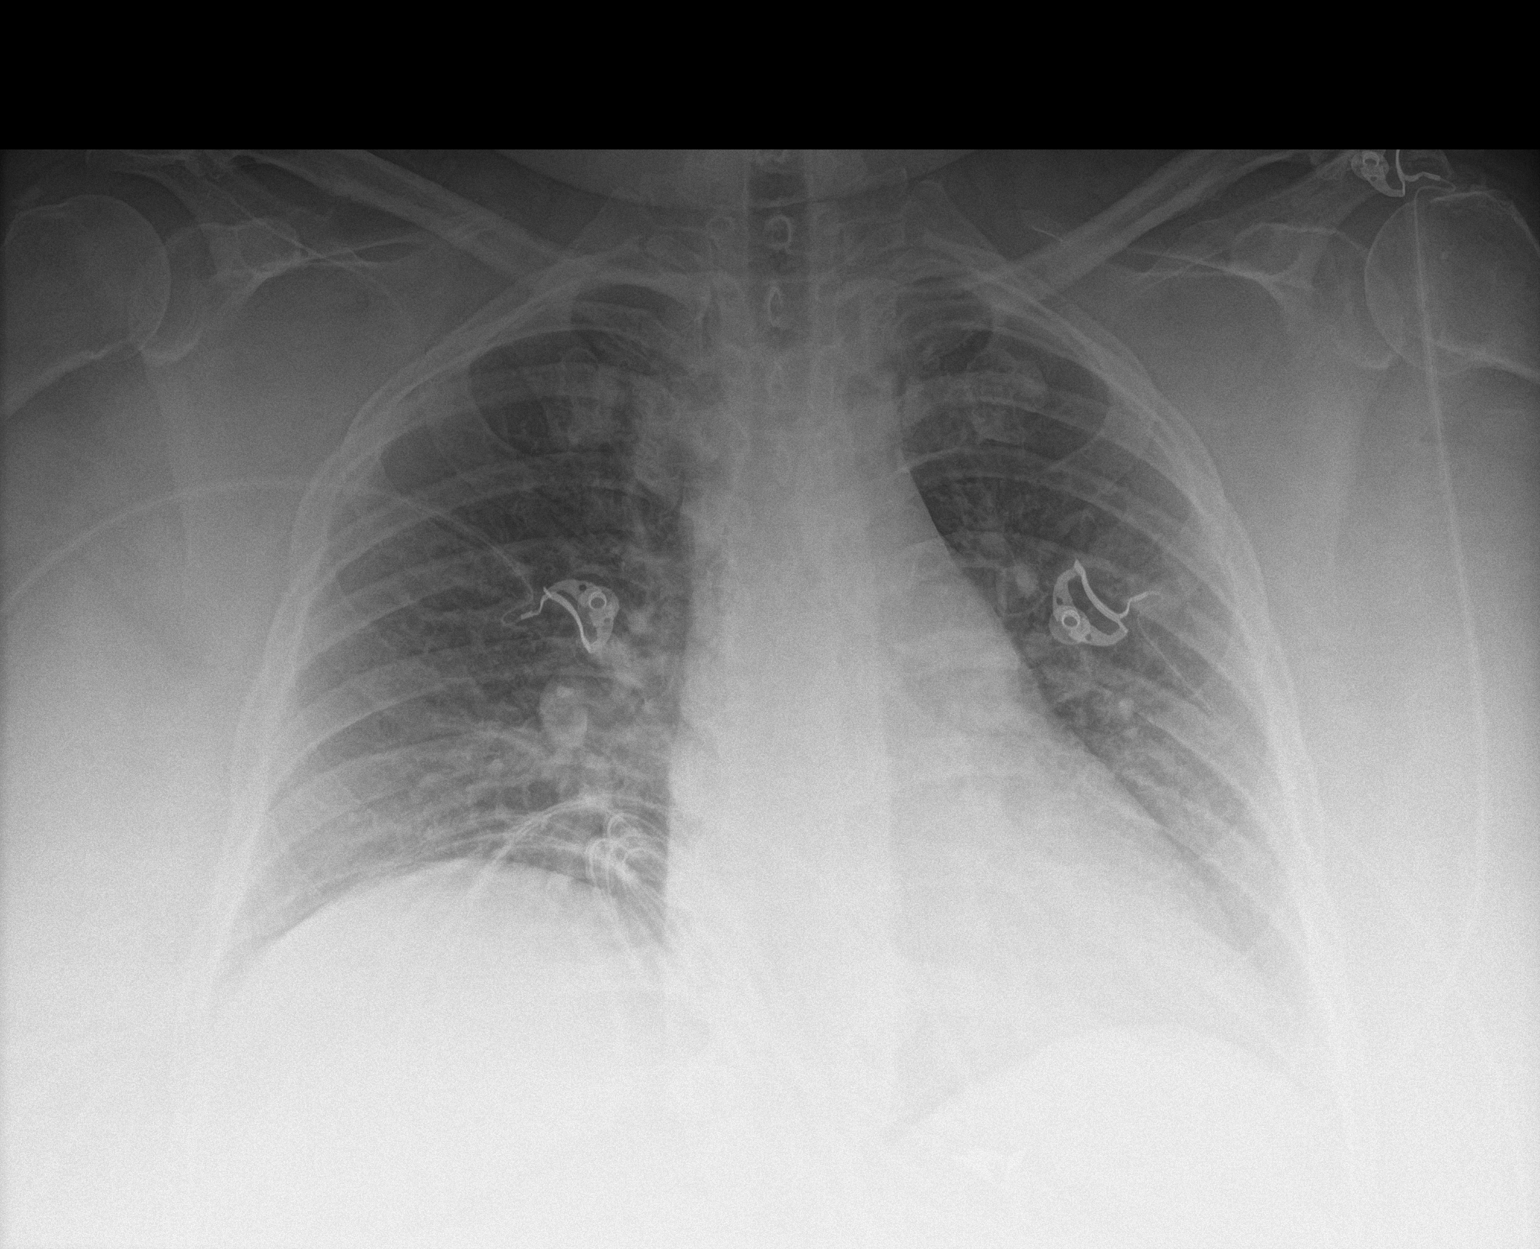

[1 of 1 positions shown; findings below may reference images not displayed]

FINDINGS: Chronic elevation of the right hemidiaphragm. Low volumes and
atelectasis. Some mild airways thickening is present as well with
more hazy retrocardiac and medial right basilar opacity which could
reflect developing airspace disease or further atelectatic change.
No pneumothorax or visible effusion. Stable cardiomediastinal
contours including some slight tortuosity of the right
brachiocephalic vasculature which contributes to some medial right
apical density stable from comparison. No acute osseous or soft
tissue abnormality. Telemetry leads overlie the chest.
IMPRESSION: 1. Low volumes and atelectasis. Chronic elevation of the right
hemidiaphragm.
2. Mild airways thickening is present with more hazy retrocardiac
and medial right basilar opacity which could reflect a developing
infectious/inflammatory process versus further atelectatic change.

## 2021-11-25 ENCOUNTER — Other Ambulatory Visit: Payer: Self-pay | Admitting: Family Medicine

## 2021-11-25 DIAGNOSIS — I5033 Acute on chronic diastolic (congestive) heart failure: Secondary | ICD-10-CM

## 2021-12-02 ENCOUNTER — Other Ambulatory Visit: Payer: Self-pay | Admitting: Family Medicine

## 2021-12-02 ENCOUNTER — Ambulatory Visit: Payer: Medicaid Other | Admitting: Cardiology

## 2021-12-02 MED ORDER — OXYCODONE-ACETAMINOPHEN 7.5-325 MG PO TABS: 1.0000 | ORAL_TABLET | Freq: Three times a day (TID) | ORAL | 0 refills | Status: DC | PRN

## 2021-12-11 IMAGING — CR DG CHEST 2V
2 series · 2 of 2 positions shown · non-contrast
Comparison: 10/09/2020

CLINICAL DATA: Left-sided chest pain.  History of CHF and COPD

EXAM:
CHEST - 2 VIEW

[chest ap]
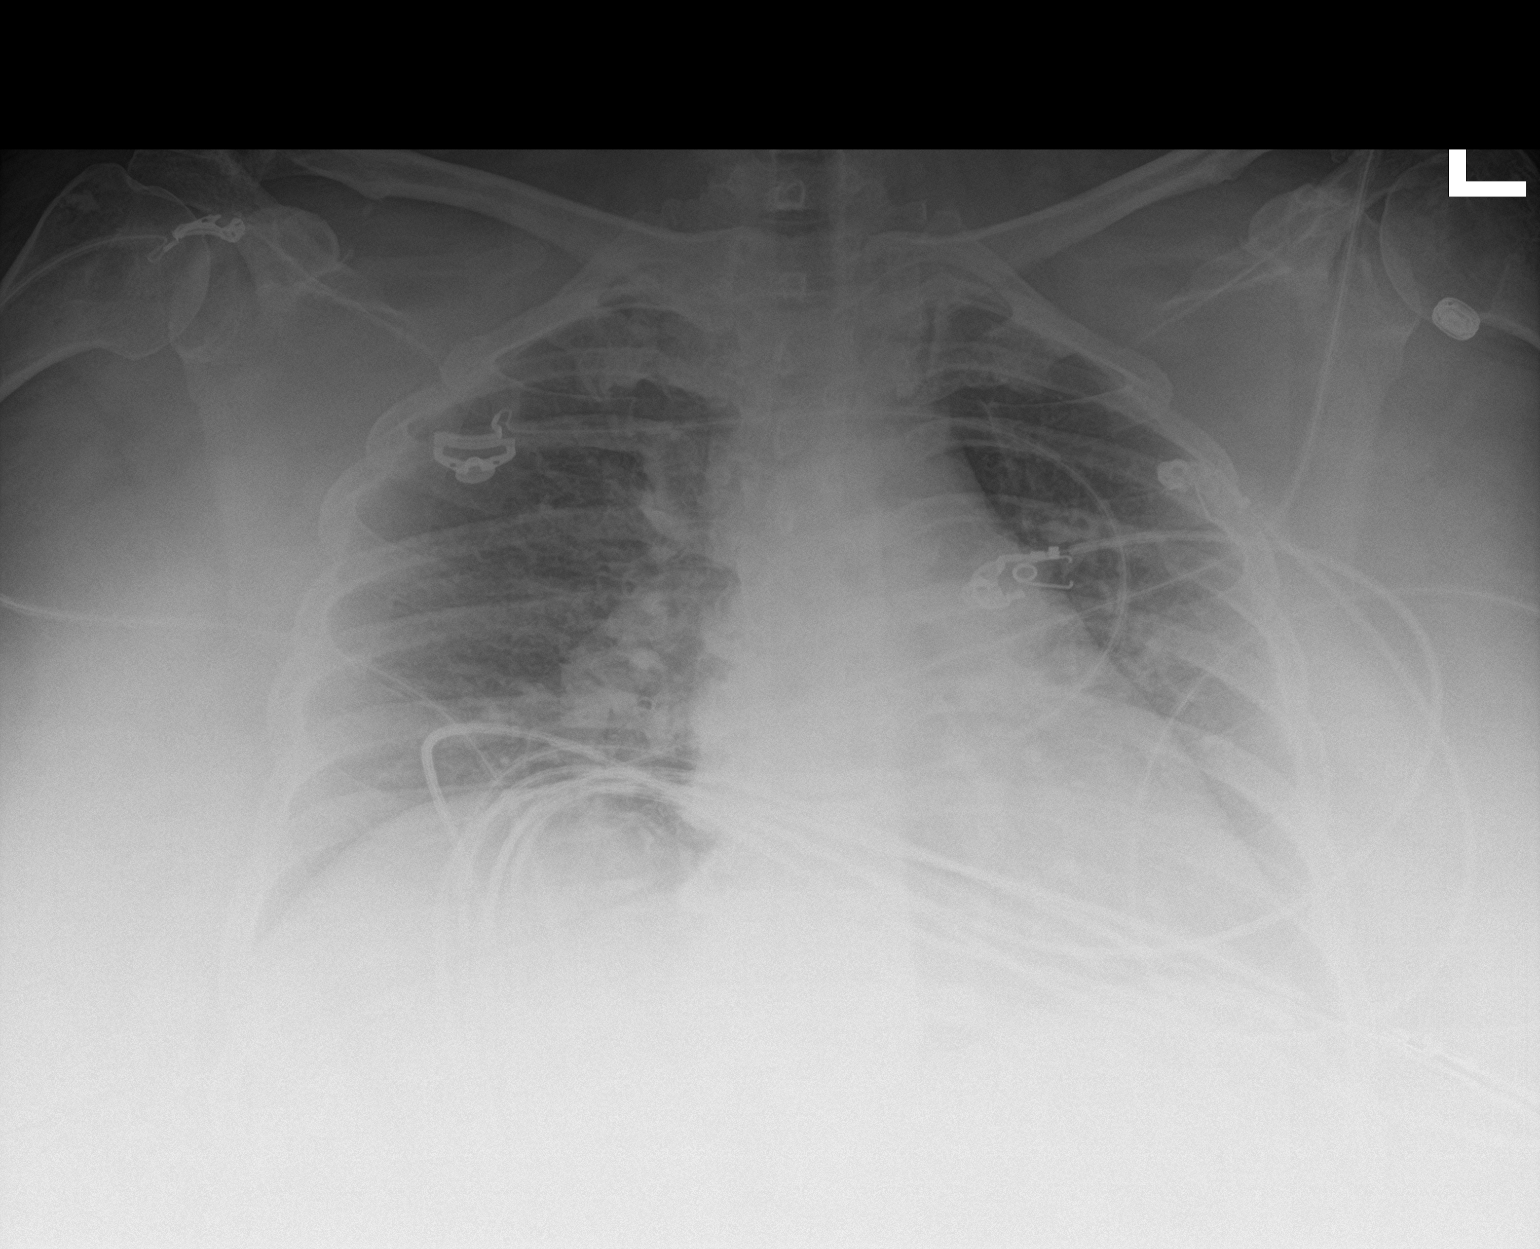

[chest lat]
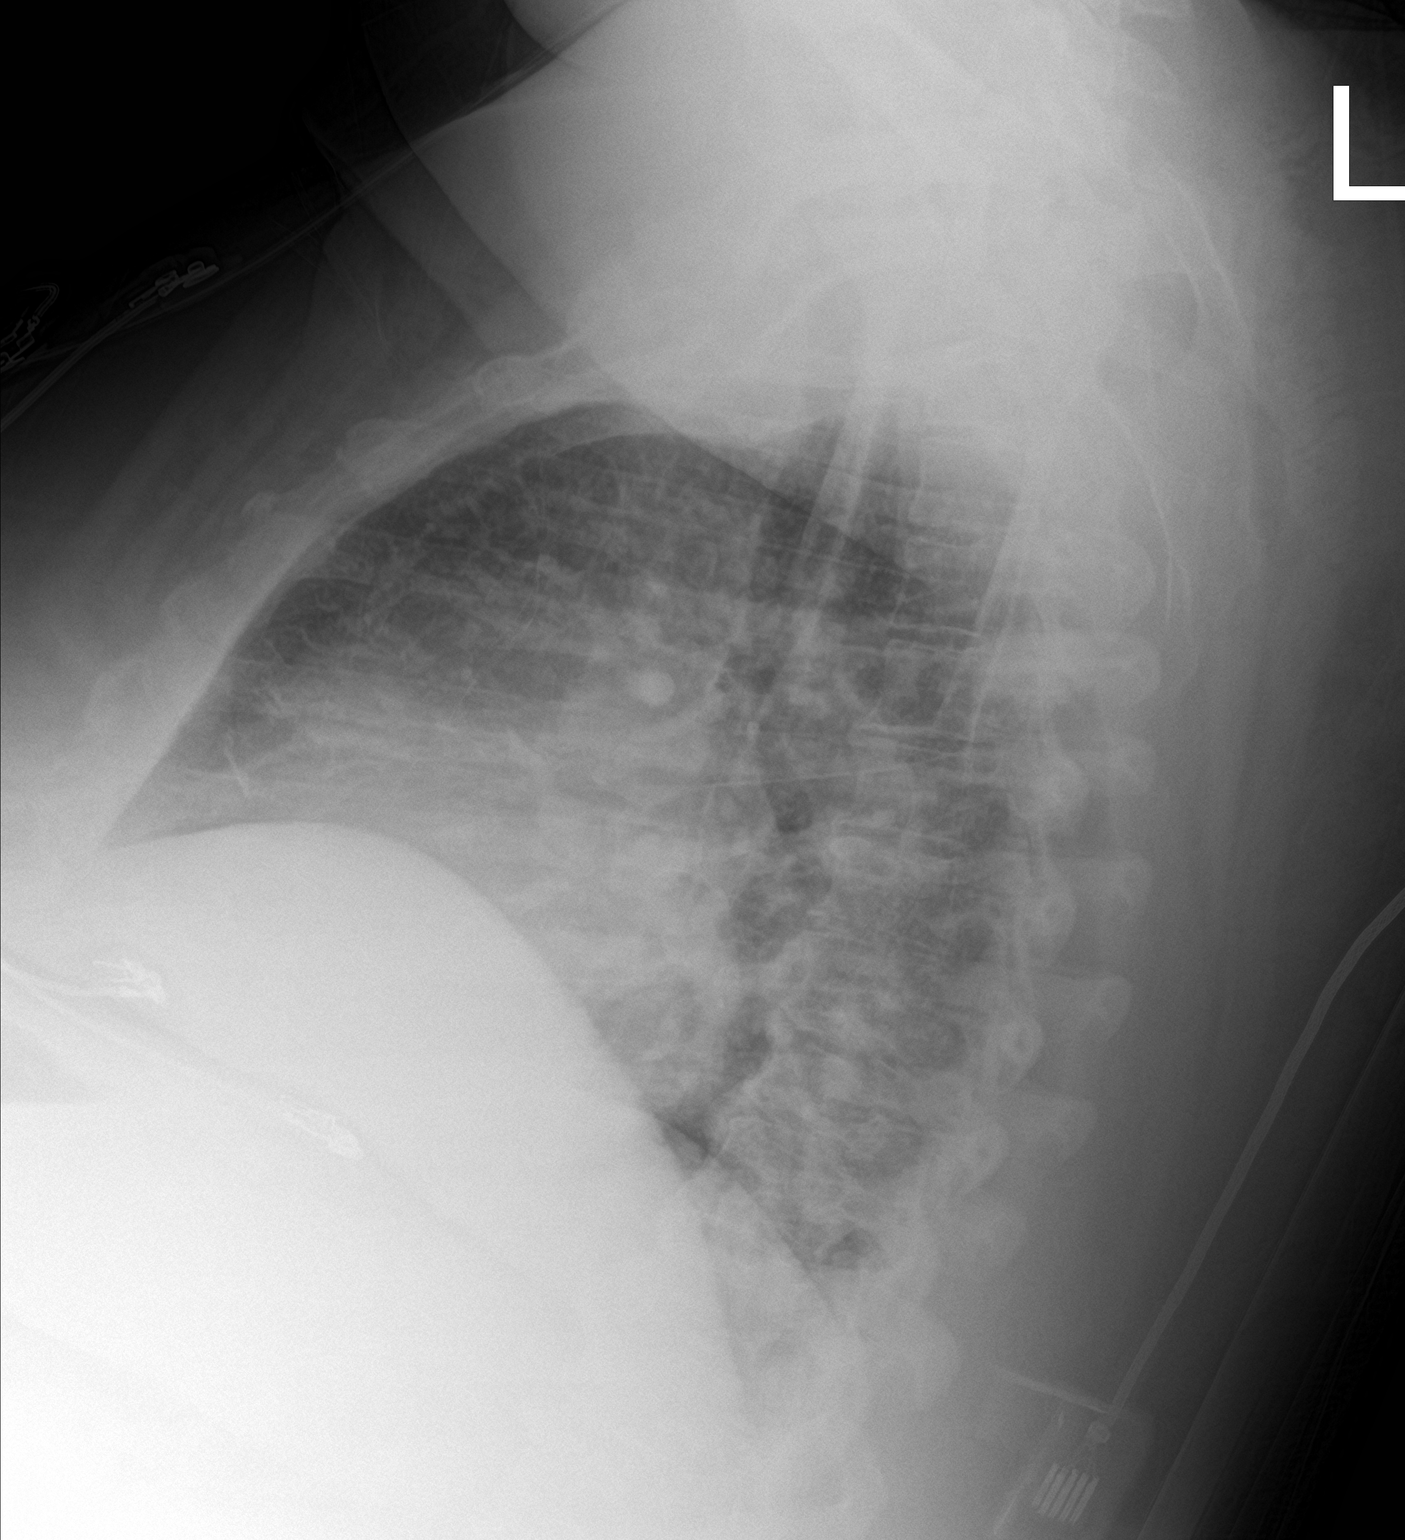

[2 of 2 positions shown; findings below may reference images not displayed]

FINDINGS: Stable cardiomediastinal contours. Low lung volumes. Mildly
prominent interstitial markings, similar to prior studies. No focal
airspace consolidation. No pleural effusion or pneumothorax.
IMPRESSION: Low lung volumes. No acute cardiopulmonary process.

## 2021-12-14 ENCOUNTER — Telehealth: Payer: Self-pay

## 2021-12-14 DIAGNOSIS — B3731 Acute candidiasis of vulva and vagina: Secondary | ICD-10-CM

## 2021-12-14 MED ORDER — FLUCONAZOLE 150 MG PO TABS: 150.0000 mg | ORAL_TABLET | Freq: Once | ORAL | 0 refills | Status: DC

## 2021-12-14 NOTE — Telephone Encounter (Signed)
Patient calls nurse line requesting a refill on yeast medication. Patient reports, "I am very raw and itchy down there." Patient denies any vaginal discharge or discomfort urinating. Patient reports the discomfort is on the "outside." Patient reports she feels her daughter does not clean her well after using her bedpan. Patient reports symptoms have been present for "a while." Patient reports she has an apt on 1/16 with Owens Shark, however she can not wait that long.   Patient is requesting diflucan or barrier cream. Please advise.

## 2021-12-14 NOTE — Telephone Encounter (Signed)
Single dose of Diflucan sent to pharmacy.  Please let patient know.  Please instruct her that if fevers the pain is not improved or her symptoms change she will need to be seen to evaluate for overlying bacterial infection.

## 2021-12-15 NOTE — Telephone Encounter (Signed)
Attempted to call pt, no answer. Will try again later. Ramsay Bognar, CMA  

## 2021-12-16 NOTE — Telephone Encounter (Signed)
Attempted to call pt again. Said they weren't accepting calls at this time. [name] Kennon Holter, CMA

## 2021-12-17 IMAGING — DX DG CHEST 1V PORT
1 series · 1 of 1 positions shown · non-contrast
Comparison: 11/01/2020

CLINICAL DATA: Chest pain

EXAM:
PORTABLE CHEST 1 VIEW

[chest ap]
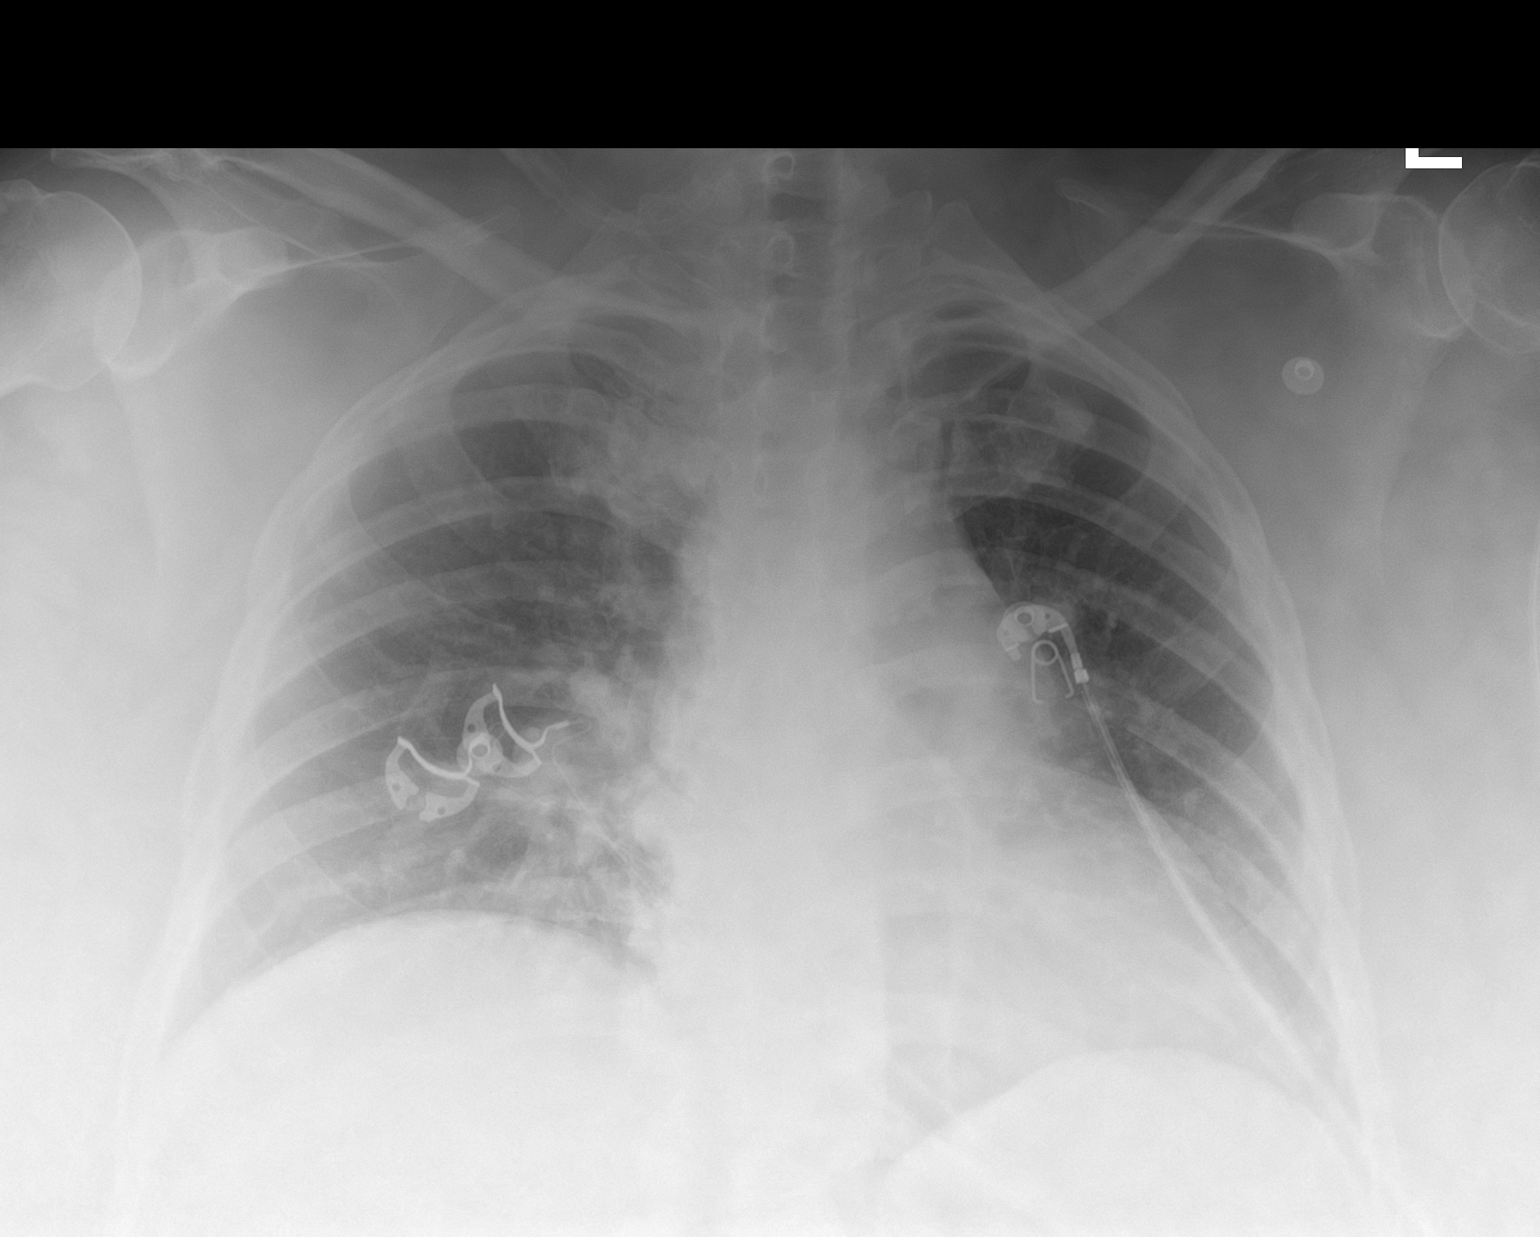

[1 of 1 positions shown; findings below may reference images not displayed]

FINDINGS: Cardiac shadow is stable. Lungs are well aerated bilaterally. Mild
bibasilar atelectatic changes are seen. No focal infiltrate or
sizable effusion is noted. No bony abnormality is seen.
IMPRESSION: Mild bibasilar atelectasis.

## 2021-12-17 IMAGING — CT CT ANGIO CHEST
2 of 6 series · 18 of 36 positions shown · IV contrast (omnipaque)
Comparison: 06/14/2020 a

CLINICAL DATA: Chest pain, dyspnea, elevated D-dimer

EXAM:
CT ANGIOGRAPHY CHEST WITH CONTRAST
TECHNIQUE: Multidetector CT imaging of the chest was performed using the
standard protocol during bolus administration of intravenous
contrast. Multiplanar CT image reconstructions and MIPs were
obtained to evaluate the vascular anatomy.
CONTRAST:  75mL OMNIPAQUE IOHEXOL 350 MG/ML SOLN

[Series 8: pe thins · axial · 0.73mm/px · z∈[+1091,+1317]mm · 17 of 171 slices shown]
[im 10/171  lung]
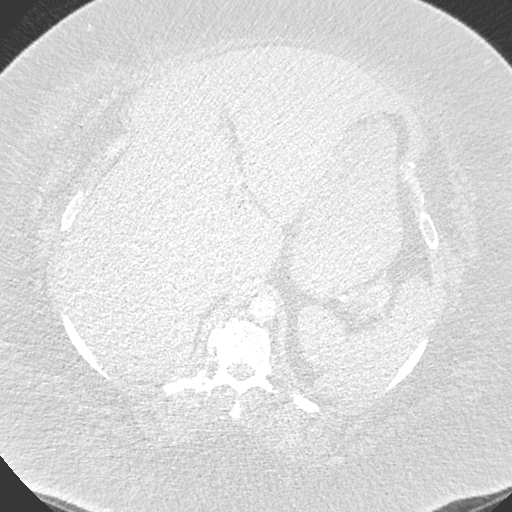
[im 19/171  mediastinal]
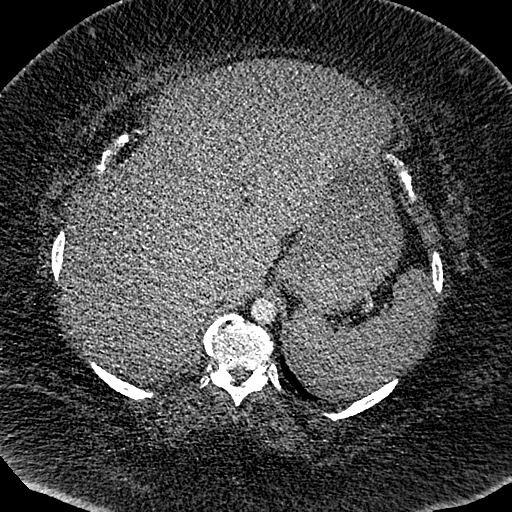
[im 29/171  lung]
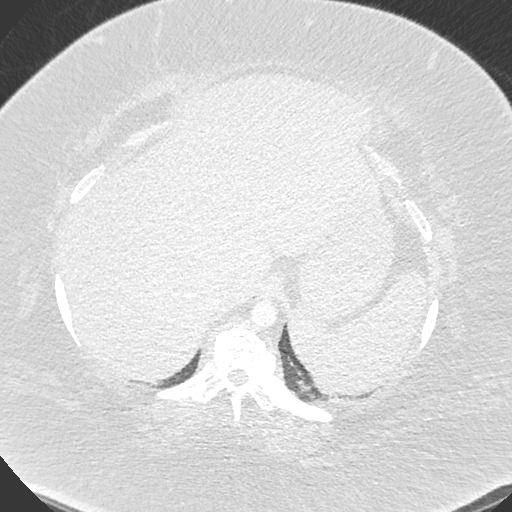
[im 38/171  mediastinal]
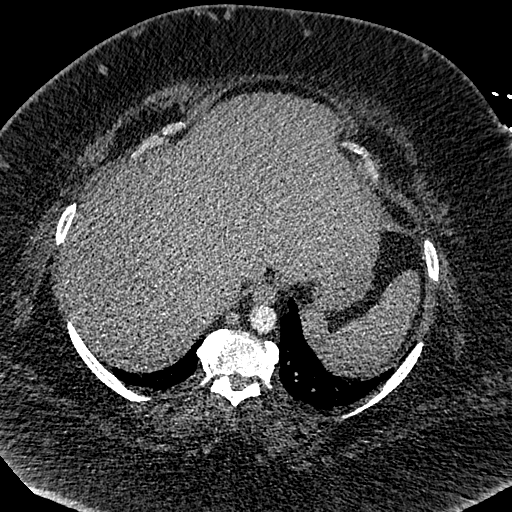
[im 48/171  lung]
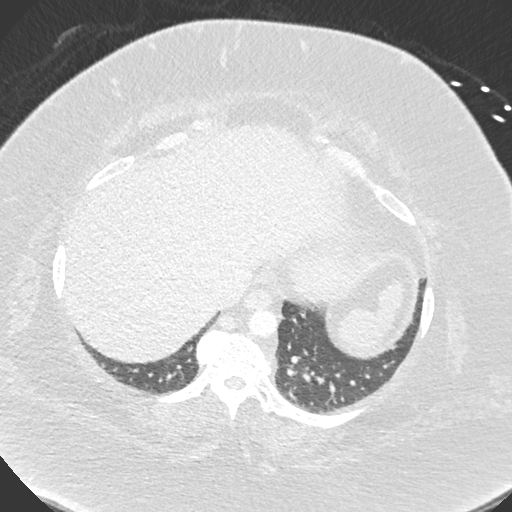
[im 57/171  mediastinal]
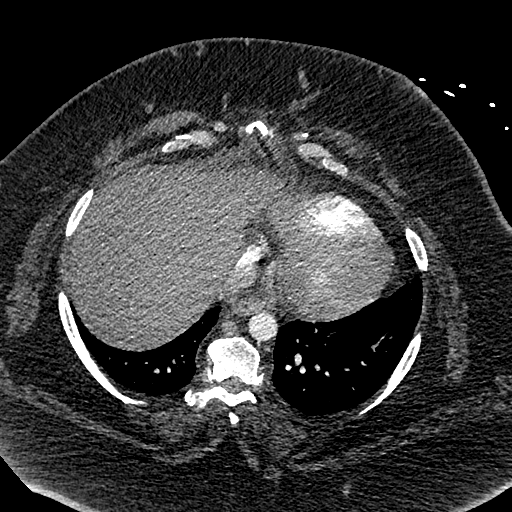
[im 67/171  lung]
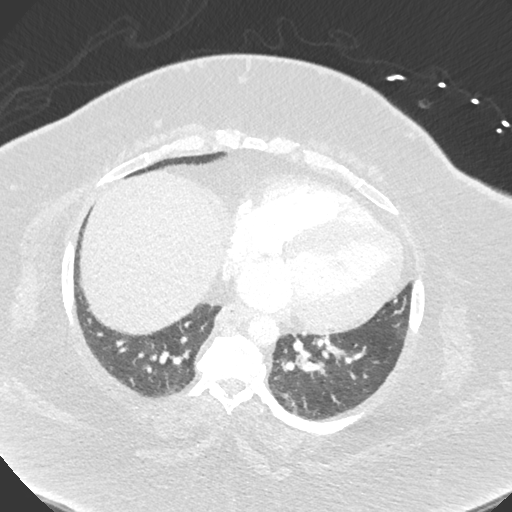
[im 76/171  mediastinal]
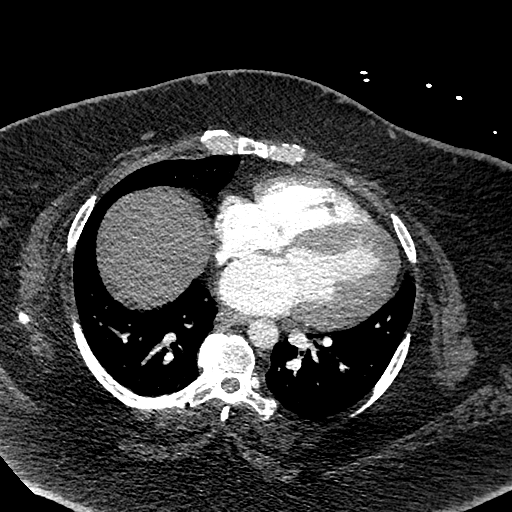
[im 86/171  lung]
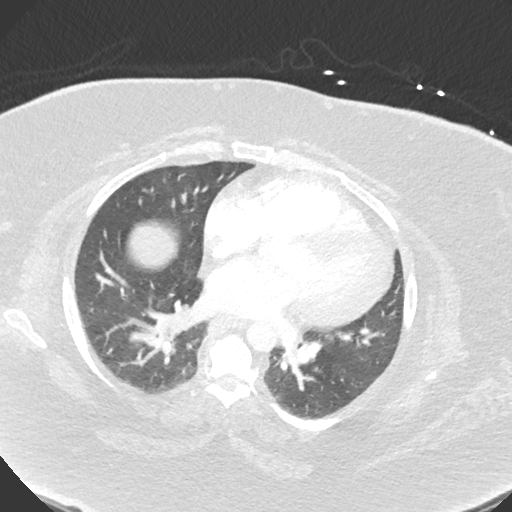
[im 95/171  mediastinal]
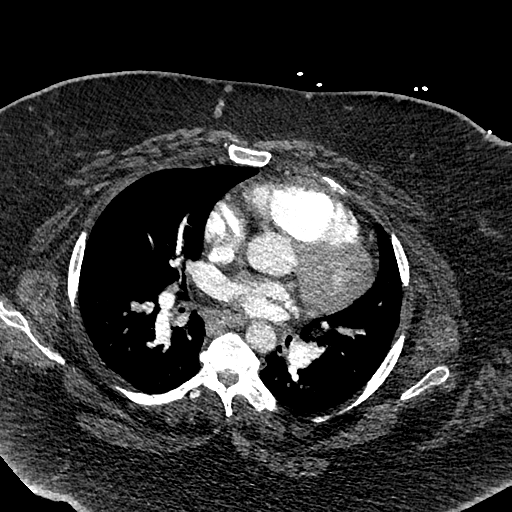
[im 104/171  lung]
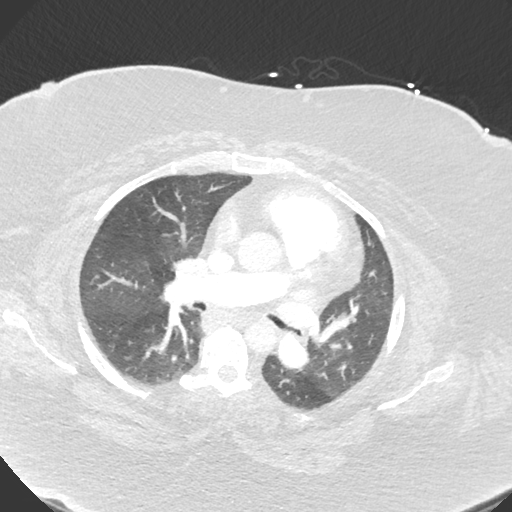
[im 114/171  mediastinal]
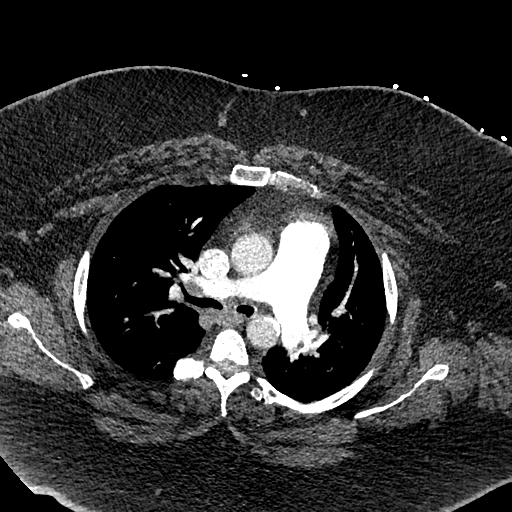
[im 123/171  lung]
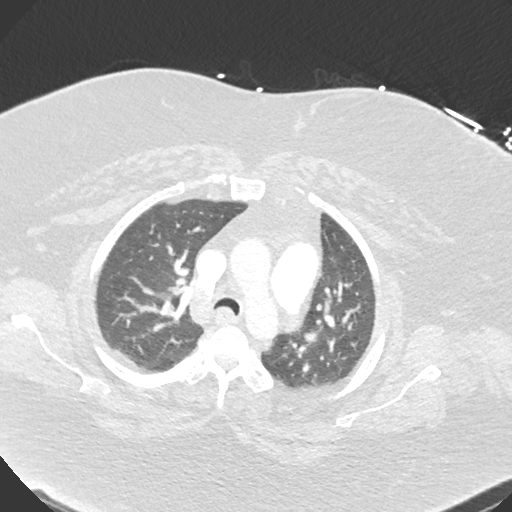
[im 133/171  mediastinal]
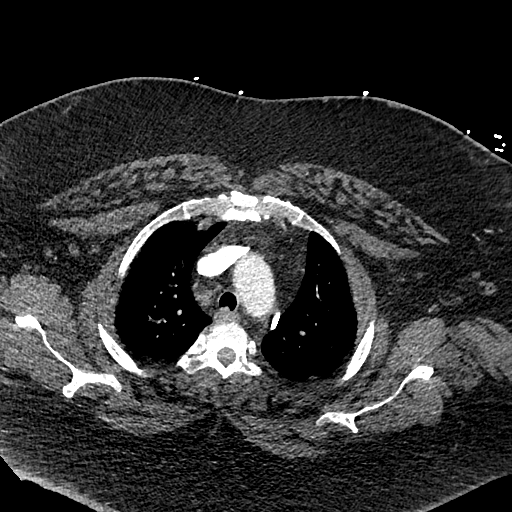
[im 142/171  lung]
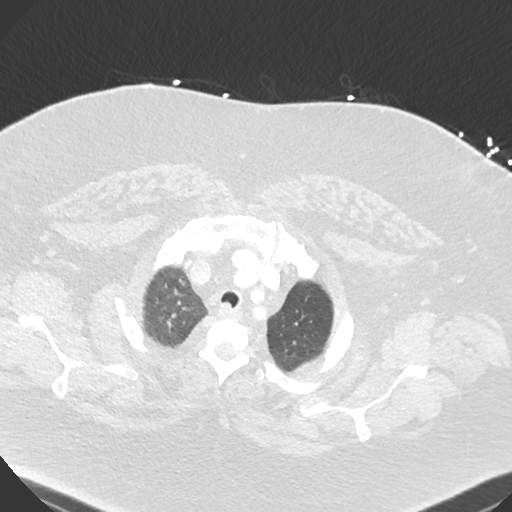
[im 152/171  mediastinal]
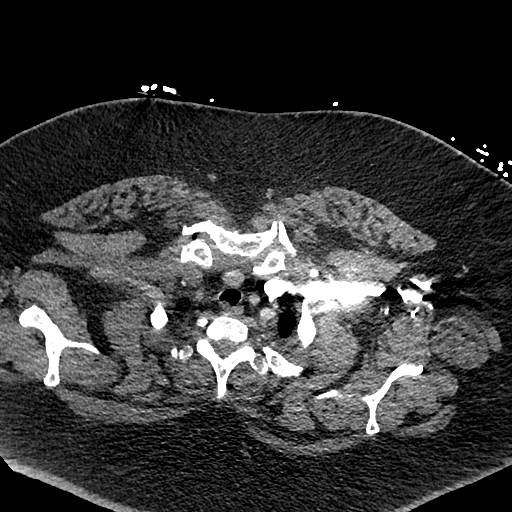
[im 161/171  lung]
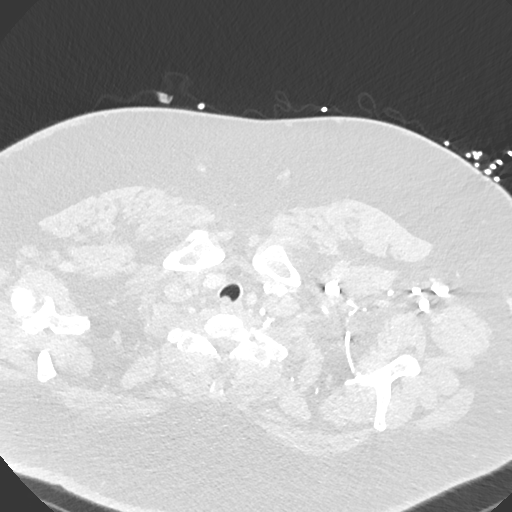

[Series 9: pe 2mm cor · coronal · 0.52mm/px · 1 of 101 slices shown]
[im 51/101  mediastinal]
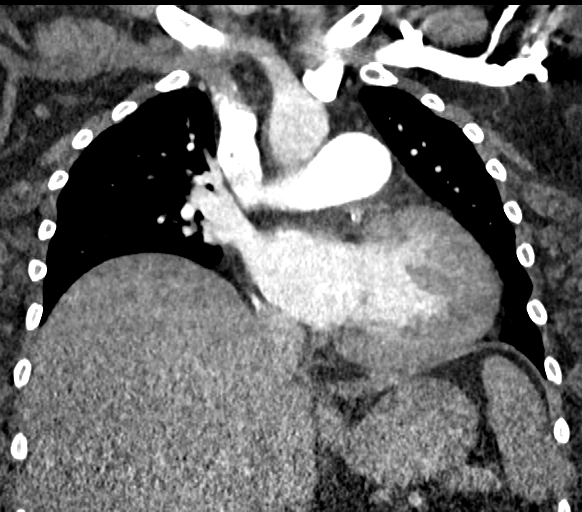

[18 of 36 positions shown; findings below may reference images not displayed]

FINDINGS: Cardiovascular: Satisfactory opacification of the pulmonary arteries
to the segmental level. No evidence of pulmonary embolism. Pulmonary
arteries are of normal caliber. Mild coronary artery calcification.
Global cardiac size within normal limits. Mild left ventricular
hypertrophy. No pericardial effusion. Mild atherosclerotic
calcification within the thoracic aorta. No aortic aneurysm.

Mediastinum/Nodes: No pathologic thoracic adenopathy. Esophagus
unremarkable.

Lungs/Pleura: Lungs are clear. No pleural effusion or pneumothorax.

Upper Abdomen: No acute abnormality.

Musculoskeletal: No acute bone abnormality

Review of the MIP images confirms the above findings.
IMPRESSION: No pulmonary embolism.

Mild coronary artery calcification. Mild left ventricular
hypertrophy.

Aortic Atherosclerosis ([id number]-EMG.G).

## 2021-12-18 ENCOUNTER — Emergency Department (HOSPITAL_COMMUNITY): Payer: Medicaid Other

## 2021-12-18 ENCOUNTER — Other Ambulatory Visit: Payer: Self-pay

## 2021-12-18 ENCOUNTER — Inpatient Hospital Stay (HOSPITAL_COMMUNITY)
Admission: EM | Admit: 2021-12-18 | Discharge: 2021-12-21 | DRG: 638 | Disposition: A | Discharge status: Home / Self Care | Payer: Medicaid Other | Attending: Family Medicine | Admitting: Family Medicine

## 2021-12-18 DIAGNOSIS — E662 Morbid (severe) obesity with alveolar hypoventilation: Secondary | ICD-10-CM | POA: Diagnosis present

## 2021-12-18 DIAGNOSIS — H209 Unspecified iridocyclitis: Secondary | ICD-10-CM

## 2021-12-18 DIAGNOSIS — Z818 Family history of other mental and behavioral disorders: Secondary | ICD-10-CM

## 2021-12-18 DIAGNOSIS — J449 Chronic obstructive pulmonary disease, unspecified: Secondary | ICD-10-CM | POA: Diagnosis present

## 2021-12-18 DIAGNOSIS — E08621 Diabetes mellitus due to underlying condition with foot ulcer: Secondary | ICD-10-CM | POA: Diagnosis present

## 2021-12-18 DIAGNOSIS — Z8616 Personal history of COVID-19: Secondary | ICD-10-CM

## 2021-12-18 DIAGNOSIS — Z881 Allergy status to other antibiotic agents status: Secondary | ICD-10-CM

## 2021-12-18 DIAGNOSIS — Z91018 Allergy to other foods: Secondary | ICD-10-CM

## 2021-12-18 DIAGNOSIS — E1122 Type 2 diabetes mellitus with diabetic chronic kidney disease: Secondary | ICD-10-CM | POA: Diagnosis present

## 2021-12-18 DIAGNOSIS — E785 Hyperlipidemia, unspecified: Secondary | ICD-10-CM | POA: Diagnosis present

## 2021-12-18 DIAGNOSIS — D509 Iron deficiency anemia, unspecified: Secondary | ICD-10-CM | POA: Diagnosis present

## 2021-12-18 DIAGNOSIS — G8929 Other chronic pain: Secondary | ICD-10-CM | POA: Diagnosis present

## 2021-12-18 DIAGNOSIS — Z83438 Family history of other disorder of lipoprotein metabolism and other lipidemia: Secondary | ICD-10-CM

## 2021-12-18 DIAGNOSIS — Z87891 Personal history of nicotine dependence: Secondary | ICD-10-CM

## 2021-12-18 DIAGNOSIS — Z809 Family history of malignant neoplasm, unspecified: Secondary | ICD-10-CM

## 2021-12-18 DIAGNOSIS — Z825 Family history of asthma and other chronic lower respiratory diseases: Secondary | ICD-10-CM

## 2021-12-18 DIAGNOSIS — Z833 Family history of diabetes mellitus: Secondary | ICD-10-CM

## 2021-12-18 DIAGNOSIS — K219 Gastro-esophageal reflux disease without esophagitis: Secondary | ICD-10-CM | POA: Diagnosis present

## 2021-12-18 DIAGNOSIS — Z888 Allergy status to other drugs, medicaments and biological substances status: Secondary | ICD-10-CM

## 2021-12-18 DIAGNOSIS — G2581 Restless legs syndrome: Secondary | ICD-10-CM | POA: Diagnosis present

## 2021-12-18 DIAGNOSIS — E1149 Type 2 diabetes mellitus with other diabetic neurological complication: Secondary | ICD-10-CM | POA: Diagnosis present

## 2021-12-18 DIAGNOSIS — E876 Hypokalemia: Secondary | ICD-10-CM | POA: Diagnosis present

## 2021-12-18 DIAGNOSIS — F3181 Bipolar II disorder: Secondary | ICD-10-CM | POA: Diagnosis present

## 2021-12-18 DIAGNOSIS — Z794 Long term (current) use of insulin: Secondary | ICD-10-CM

## 2021-12-18 DIAGNOSIS — E559 Vitamin D deficiency, unspecified: Secondary | ICD-10-CM | POA: Diagnosis present

## 2021-12-18 DIAGNOSIS — G4700 Insomnia, unspecified: Secondary | ICD-10-CM | POA: Diagnosis present

## 2021-12-18 DIAGNOSIS — G629 Polyneuropathy, unspecified: Secondary | ICD-10-CM | POA: Diagnosis present

## 2021-12-18 DIAGNOSIS — I130 Hypertensive heart and chronic kidney disease with heart failure and stage 1 through stage 4 chronic kidney disease, or unspecified chronic kidney disease: Secondary | ICD-10-CM | POA: Diagnosis present

## 2021-12-18 DIAGNOSIS — F32A Depression, unspecified: Secondary | ICD-10-CM | POA: Diagnosis present

## 2021-12-18 DIAGNOSIS — E872 Acidosis, unspecified: Secondary | ICD-10-CM | POA: Diagnosis present

## 2021-12-18 DIAGNOSIS — R739 Hyperglycemia, unspecified: Secondary | ICD-10-CM

## 2021-12-18 DIAGNOSIS — Z885 Allergy status to narcotic agent status: Secondary | ICD-10-CM

## 2021-12-18 DIAGNOSIS — I2510 Atherosclerotic heart disease of native coronary artery without angina pectoris: Secondary | ICD-10-CM | POA: Diagnosis present

## 2021-12-18 DIAGNOSIS — Z6841 Body Mass Index (BMI) 40.0 and over, adult: Secondary | ICD-10-CM

## 2021-12-18 DIAGNOSIS — Z79899 Other long term (current) drug therapy: Secondary | ICD-10-CM

## 2021-12-18 DIAGNOSIS — E11622 Type 2 diabetes mellitus with other skin ulcer: Secondary | ICD-10-CM | POA: Diagnosis present

## 2021-12-18 DIAGNOSIS — Z8701 Personal history of pneumonia (recurrent): Secondary | ICD-10-CM

## 2021-12-18 DIAGNOSIS — Z7982 Long term (current) use of aspirin: Secondary | ICD-10-CM

## 2021-12-18 DIAGNOSIS — I5032 Chronic diastolic (congestive) heart failure: Secondary | ICD-10-CM | POA: Diagnosis present

## 2021-12-18 DIAGNOSIS — Z89512 Acquired absence of left leg below knee: Secondary | ICD-10-CM

## 2021-12-18 DIAGNOSIS — B379 Candidiasis, unspecified: Secondary | ICD-10-CM | POA: Diagnosis present

## 2021-12-18 DIAGNOSIS — N1831 Chronic kidney disease, stage 3a: Secondary | ICD-10-CM | POA: Diagnosis present

## 2021-12-18 DIAGNOSIS — Z8249 Family history of ischemic heart disease and other diseases of the circulatory system: Secondary | ICD-10-CM

## 2021-12-18 DIAGNOSIS — Z7951 Long term (current) use of inhaled steroids: Secondary | ICD-10-CM

## 2021-12-18 DIAGNOSIS — Z7984 Long term (current) use of oral hypoglycemic drugs: Secondary | ICD-10-CM

## 2021-12-18 DIAGNOSIS — E1165 Type 2 diabetes mellitus with hyperglycemia: Principal | ICD-10-CM | POA: Diagnosis present

## 2021-12-18 DIAGNOSIS — H5704 Mydriasis: Secondary | ICD-10-CM | POA: Diagnosis present

## 2021-12-18 DIAGNOSIS — L97519 Non-pressure chronic ulcer of other part of right foot with unspecified severity: Secondary | ICD-10-CM | POA: Diagnosis present

## 2021-12-18 DIAGNOSIS — N179 Acute kidney failure, unspecified: Secondary | ICD-10-CM | POA: Diagnosis present

## 2021-12-18 DIAGNOSIS — E11621 Type 2 diabetes mellitus with foot ulcer: Secondary | ICD-10-CM | POA: Diagnosis present

## 2021-12-18 MED ORDER — TERCONAZOLE 0.4 % VA CREA: 1.0000 | TOPICAL_CREAM | Freq: Every day | VAGINAL | 0 refills | Status: DC

## 2021-12-18 MED ORDER — FLUCONAZOLE 150 MG PO TABS: 150.0000 mg | ORAL_TABLET | Freq: Once | ORAL | 0 refills | Status: AC

## 2021-12-18 NOTE — Telephone Encounter (Signed)
Finally was able to reach patient.    [name] states that Dr. [name] usually gives her two Difulcan pills and a cream to use of the outside of her vagina.  Patient states that the yeast is really 'bad' and [name] needs relief now.  The first pill did "nothing" for her symptoms.  Patient has appointment on 12/21/2021.  [name], [place]

## 2021-12-18 NOTE — Telephone Encounter (Signed)
Called patient she has multiple concerns.  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  She reports this is draining and bleeding.  She also reports she had an eye procedure earlier this week and has a severe headache and has not been able to see out of her eye.  She has called her eye doctor without response.  Finally she reports her blood glucose are markedly elevated and this is worsening her yeast vaginitis.  Most concerning is her foot ulcer.  Given these concerns I recommended immediate evaluation ER.  She is going to proceed to the ER at this time.  Sent a refill for her fluconazole and terconazole in the case she chooses not to go.  Strongly advised that she be seen in the emergency room for her draining foot ulcer given the concern this may be limb threatening.  Dorris Singh, MD  Family Medicine Teaching Service  .

## 2021-12-18 NOTE — ED Provider Triage Note (Signed)
Emergency Medicine Provider Triage Evaluation Note  [name] , a 46 y.o. female  was evaluated in triage.  Pt complains of right foot wound.  Patient states wounds have been worsening over the past 10 days.  It is becoming more painful and reopened.  She used to be seen at [facility] long wound care center, but no longer goes as the wound had healed.  She is not currently on any antibiotics.  CBG is elevated as she has not had her insulin.  No fevers, confusion, nausea, vomiting.  Review of Systems  Positive: wound Negative: fever  Physical Exam  There were no vitals taken for this visit. 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  Mild tenderness, no purulent drainage.  Medical Decision Making  Medically screening exam initiated at 11:09 PM.  Appropriate orders placed.  Lashayla Armes was informed that the remainder of the evaluation will be completed by another provider, this initial triage assessment does not replace that evaluation, and the importance of remaining in the ED until their evaluation is complete.  Labs, imaging   [place], PA-C 12/18/21 2322

## 2021-12-18 NOTE — ED Triage Notes (Signed)
Pt here from home via [facility] for R foot pain x1 year. Per pt she has a wound inside her R foot and she has previously seen wound care at [facility], but needs a referral back to them. Pt reports being in more pain today. 90HR, 168/104, 99% RA, cbg 530

## 2021-12-18 NOTE — Addendum Note (Signed)
Addended by: Owens Shark, Brooklin Rieger on: 12/18/2021 11:58 AM   Modules accepted: Orders

## 2021-12-19 ENCOUNTER — Inpatient Hospital Stay (HOSPITAL_COMMUNITY): Payer: Medicaid Other

## 2021-12-19 ENCOUNTER — Encounter (HOSPITAL_COMMUNITY): Payer: Self-pay | Admitting: Family Medicine

## 2021-12-19 DIAGNOSIS — D509 Iron deficiency anemia, unspecified: Secondary | ICD-10-CM | POA: Diagnosis present

## 2021-12-19 DIAGNOSIS — F32A Depression, unspecified: Secondary | ICD-10-CM | POA: Diagnosis present

## 2021-12-19 DIAGNOSIS — E559 Vitamin D deficiency, unspecified: Secondary | ICD-10-CM | POA: Diagnosis present

## 2021-12-19 DIAGNOSIS — E1165 Type 2 diabetes mellitus with hyperglycemia: Secondary | ICD-10-CM | POA: Diagnosis present

## 2021-12-19 DIAGNOSIS — G2581 Restless legs syndrome: Secondary | ICD-10-CM | POA: Diagnosis present

## 2021-12-19 DIAGNOSIS — I130 Hypertensive heart and chronic kidney disease with heart failure and stage 1 through stage 4 chronic kidney disease, or unspecified chronic kidney disease: Secondary | ICD-10-CM | POA: Diagnosis present

## 2021-12-19 DIAGNOSIS — J449 Chronic obstructive pulmonary disease, unspecified: Secondary | ICD-10-CM | POA: Diagnosis present

## 2021-12-19 DIAGNOSIS — L97519 Non-pressure chronic ulcer of other part of right foot with unspecified severity: Secondary | ICD-10-CM | POA: Diagnosis present

## 2021-12-19 DIAGNOSIS — S91301A Unspecified open wound, right foot, initial encounter: Secondary | ICD-10-CM | POA: Diagnosis present

## 2021-12-19 DIAGNOSIS — E662 Morbid (severe) obesity with alveolar hypoventilation: Secondary | ICD-10-CM | POA: Diagnosis present

## 2021-12-19 DIAGNOSIS — E11622 Type 2 diabetes mellitus with other skin ulcer: Secondary | ICD-10-CM | POA: Diagnosis present

## 2021-12-19 DIAGNOSIS — Z6841 Body Mass Index (BMI) 40.0 and over, adult: Secondary | ICD-10-CM | POA: Diagnosis not present

## 2021-12-19 DIAGNOSIS — E785 Hyperlipidemia, unspecified: Secondary | ICD-10-CM | POA: Diagnosis present

## 2021-12-19 DIAGNOSIS — E11621 Type 2 diabetes mellitus with foot ulcer: Secondary | ICD-10-CM

## 2021-12-19 DIAGNOSIS — F3181 Bipolar II disorder: Secondary | ICD-10-CM | POA: Diagnosis present

## 2021-12-19 DIAGNOSIS — N179 Acute kidney failure, unspecified: Secondary | ICD-10-CM | POA: Diagnosis present

## 2021-12-19 DIAGNOSIS — Z8616 Personal history of COVID-19: Secondary | ICD-10-CM | POA: Diagnosis not present

## 2021-12-19 DIAGNOSIS — E1122 Type 2 diabetes mellitus with diabetic chronic kidney disease: Secondary | ICD-10-CM | POA: Diagnosis present

## 2021-12-19 DIAGNOSIS — H209 Unspecified iridocyclitis: Secondary | ICD-10-CM | POA: Diagnosis present

## 2021-12-19 DIAGNOSIS — E872 Acidosis, unspecified: Secondary | ICD-10-CM | POA: Diagnosis present

## 2021-12-19 DIAGNOSIS — I5032 Chronic diastolic (congestive) heart failure: Secondary | ICD-10-CM | POA: Diagnosis present

## 2021-12-19 DIAGNOSIS — E08621 Diabetes mellitus due to underlying condition with foot ulcer: Secondary | ICD-10-CM | POA: Diagnosis present

## 2021-12-19 DIAGNOSIS — N1831 Chronic kidney disease, stage 3a: Secondary | ICD-10-CM | POA: Diagnosis present

## 2021-12-19 DIAGNOSIS — B379 Candidiasis, unspecified: Secondary | ICD-10-CM | POA: Diagnosis present

## 2021-12-19 DIAGNOSIS — G4700 Insomnia, unspecified: Secondary | ICD-10-CM | POA: Diagnosis present

## 2021-12-19 DIAGNOSIS — E1100 Type 2 diabetes mellitus with hyperosmolarity without nonketotic hyperglycemic-hyperosmolar coma (NKHHC): Secondary | ICD-10-CM | POA: Diagnosis not present

## 2021-12-19 DIAGNOSIS — E1149 Type 2 diabetes mellitus with other diabetic neurological complication: Secondary | ICD-10-CM | POA: Diagnosis present

## 2021-12-19 HISTORY — DX: Type 2 diabetes mellitus with foot ulcer: E11.621

## 2021-12-19 LAB — I-STAT VENOUS BLOOD GAS, ED
Acid-Base Excess: 2 mmol/L (ref 0.0–2.0)
Bicarbonate: 27.2 mmol/L (ref 20.0–28.0)
Calcium, Ion: 1.04 mmol/L — ABNORMAL LOW (ref 1.15–1.40)
HCT: 43 % (ref 36.0–46.0)
Hemoglobin: 14.6 g/dL (ref 12.0–15.0)
O2 Saturation: 71 %
Potassium: 4.3 mmol/L (ref 3.5–5.1)
Sodium: 121 mmol/L — ABNORMAL LOW (ref 135–145)
TCO2: 29 mmol/L (ref 22–32)
pCO2, Ven: 42.9 mmHg — ABNORMAL LOW (ref 44.0–60.0)
pH, Ven: 7.411 (ref 7.250–7.430)
pO2, Ven: 37 mmHg (ref 32.0–45.0)

## 2021-12-19 LAB — URINALYSIS, MICROSCOPIC (REFLEX)

## 2021-12-19 LAB — CBC WITH DIFFERENTIAL/PLATELET
Abs Immature Granulocytes: 0.09 10*3/uL — ABNORMAL HIGH (ref 0.00–0.07)
Basophils Absolute: 0.1 10*3/uL (ref 0.0–0.1)
Basophils Relative: 1 %
Eosinophils Absolute: 0.2 10*3/uL (ref 0.0–0.5)
Eosinophils Relative: 1 %
HCT: 37.6 % (ref 36.0–46.0)
Hemoglobin: 11.9 g/dL — ABNORMAL LOW (ref 12.0–15.0)
Immature Granulocytes: 1 %
Lymphocytes Relative: 35 %
Lymphs Abs: 3.8 10*3/uL (ref 0.7–4.0)
MCH: 30.5 pg (ref 26.0–34.0)
MCHC: 31.6 g/dL (ref 30.0–36.0)
MCV: 96.4 fL (ref 80.0–100.0)
Monocytes Absolute: 0.7 10*3/uL (ref 0.1–1.0)
Monocytes Relative: 6 %
Neutro Abs: 6.2 10*3/uL (ref 1.7–7.7)
Neutrophils Relative %: 56 %
Platelets: 353 10*3/uL (ref 150–400)
RBC: 3.9 MIL/uL (ref 3.87–5.11)
RDW: 13.6 % (ref 11.5–15.5)
WBC: 10.9 10*3/uL — ABNORMAL HIGH (ref 4.0–10.5)
nRBC: 0 % (ref 0.0–0.2)

## 2021-12-19 LAB — BLOOD GAS, VENOUS
Acid-Base Excess: 1.3 mmol/L (ref 0.0–2.0)
Bicarbonate: 26.7 mmol/L (ref 20.0–28.0)
Drawn by: 164
FIO2: 21
O2 Saturation: 29.3 %
Patient temperature: 37
pCO2, Ven: 52.7 mmHg (ref 44.0–60.0)
pH, Ven: 7.325 (ref 7.250–7.430)
pO2, Ven: <32 mmHg — CL (ref 32.0–45.0)

## 2021-12-19 LAB — COMPREHENSIVE METABOLIC PANEL
ALT: 18 U/L (ref 0–44)
AST: 16 U/L (ref 15–41)
Albumin: 2.8 g/dL — ABNORMAL LOW (ref 3.5–5.0)
Alkaline Phosphatase: 200 U/L — ABNORMAL HIGH (ref 38–126)
Anion gap: 15 (ref 5–15)
BUN: 53 mg/dL — ABNORMAL HIGH (ref 6–20)
CO2: 25 mmol/L (ref 22–32)
Calcium: 8.7 mg/dL — ABNORMAL LOW (ref 8.9–10.3)
Chloride: 81 mmol/L — ABNORMAL LOW (ref 98–111)
Creatinine, Ser: 1.76 mg/dL — ABNORMAL HIGH (ref 0.44–1.00)
GFR, Estimated: 36 mL/min — ABNORMAL LOW (ref >60)
Glucose, Bld: 904 mg/dL (ref 70–99)
Potassium: 4.2 mmol/L (ref 3.5–5.1)
Sodium: 121 mmol/L — ABNORMAL LOW (ref 135–145)
Total Bilirubin: 0.3 mg/dL (ref 0.3–1.2)
Total Protein: 7.9 g/dL (ref 6.5–8.1)

## 2021-12-19 LAB — I-STAT CHEM 8, ED
BUN: 69 mg/dL — ABNORMAL HIGH (ref 6–20)
Calcium, Ion: 1.06 mmol/L — ABNORMAL LOW (ref 1.15–1.40)
Chloride: 87 mmol/L — ABNORMAL LOW (ref 98–111)
Creatinine, Ser: 1.4 mg/dL — ABNORMAL HIGH (ref 0.44–1.00)
Glucose, Bld: >700 mg/dL (ref 70–99)
HCT: 43 % (ref 36.0–46.0)
Hemoglobin: 14.6 g/dL (ref 12.0–15.0)
Potassium: 4.4 mmol/L (ref 3.5–5.1)
Sodium: 121 mmol/L — ABNORMAL LOW (ref 135–145)
TCO2: 26 mmol/L (ref 22–32)

## 2021-12-19 LAB — CBG MONITORING, ED
Glucose-Capillary: >600 mg/dL (ref 70–99)
Glucose-Capillary: >600 mg/dL (ref 70–99)
Glucose-Capillary: >600 mg/dL (ref 70–99)
Glucose-Capillary: 586 mg/dL (ref 70–99)
Glucose-Capillary: 549 mg/dL (ref 70–99)
Glucose-Capillary: 394 mg/dL — ABNORMAL HIGH (ref 70–99)
Glucose-Capillary: 466 mg/dL — ABNORMAL HIGH (ref 70–99)
Glucose-Capillary: 393 mg/dL — ABNORMAL HIGH (ref 70–99)

## 2021-12-19 LAB — URINALYSIS, ROUTINE W REFLEX MICROSCOPIC
Bilirubin Urine: NEGATIVE
Glucose, UA: >=500 mg/dL — AB
Ketones, ur: NEGATIVE mg/dL
Nitrite: NEGATIVE
Protein, ur: NEGATIVE mg/dL
Specific Gravity, Urine: 1.01 (ref 1.005–1.030)
pH: 5 (ref 5.0–8.0)

## 2021-12-19 LAB — GLUCOSE, CAPILLARY
Glucose-Capillary: 302 mg/dL — ABNORMAL HIGH (ref 70–99)
Glucose-Capillary: 534 mg/dL (ref 70–99)
Glucose-Capillary: 439 mg/dL — ABNORMAL HIGH (ref 70–99)

## 2021-12-19 LAB — OSMOLALITY: Osmolality: 320 mOsm/kg — ABNORMAL HIGH (ref 275–295)

## 2021-12-19 LAB — LACTIC ACID, PLASMA
Lactic Acid, Venous: 3.5 mmol/L (ref 0.5–1.9)
Lactic Acid, Venous: 3 mmol/L (ref 0.5–1.9)

## 2021-12-19 LAB — I-STAT BETA HCG BLOOD, ED (MC, WL, AP ONLY): I-stat hCG, quantitative: <5 m[IU]/mL (ref <5)

## 2021-12-19 MED ORDER — DEXTROSE 50 % IV SOLN: 0.0000 mL | INTRAVENOUS | Status: DC | PRN

## 2021-12-19 MED ORDER — FLUCONAZOLE 150 MG PO TABS: 150.0000 mg | ORAL_TABLET | Freq: Once | ORAL | Status: DC

## 2021-12-19 MED ORDER — ACETAMINOPHEN 325 MG PO TABS: 650.0000 mg | ORAL_TABLET | Freq: Four times a day (QID) | ORAL | Status: DC | PRN

## 2021-12-19 MED ORDER — INSULIN GLARGINE-YFGN 100 UNIT/ML ~~LOC~~ SOLN
45.0000 [IU] | Freq: Every day | SUBCUTANEOUS | Status: DC
  Administered 2021-12-20 – 2021-12-21 (×2): 45 [IU] via SUBCUTANEOUS
  Filled 2021-12-19 (×2): qty 0.45

## 2021-12-19 MED ORDER — RAMELTEON 8 MG PO TABS
8.0000 mg | ORAL_TABLET | Freq: Every day | ORAL | Status: DC
  Administered 2021-12-19 – 2021-12-20 (×2): 8 mg via ORAL
  Filled 2021-12-19 (×3): qty 1

## 2021-12-19 MED ORDER — INSULIN REGULAR(HUMAN) IN NACL 100-0.9 UT/100ML-% IV SOLN
INTRAVENOUS | Status: DC
  Administered 2021-12-19: 20 [IU]/h via INTRAVENOUS
  Administered 2021-12-19: 18 [IU]/h via INTRAVENOUS
  Administered 2021-12-19: 15 [IU]/h via INTRAVENOUS
  Administered 2021-12-19: 13 [IU]/h via INTRAVENOUS
  Administered 2021-12-19: 19 [IU]/h via INTRAVENOUS
  Filled 2021-12-19: qty 100

## 2021-12-19 MED ORDER — GADOBUTROL 1 MMOL/ML IV SOLN
14.0000 mL | Freq: Once | INTRAVENOUS | Status: AC | PRN
  Administered 2021-12-19: 14 mL via INTRAVENOUS

## 2021-12-19 MED ORDER — FENTANYL CITRATE PF 50 MCG/ML IJ SOSY
50.0000 ug | PREFILLED_SYRINGE | Freq: Once | INTRAMUSCULAR | Status: AC
  Administered 2021-12-19: 50 ug via INTRAVENOUS
  Filled 2021-12-19: qty 1

## 2021-12-19 MED ORDER — ALBUTEROL SULFATE HFA 108 (90 BASE) MCG/ACT IN AERS
1.0000 | INHALATION_SPRAY | Freq: Four times a day (QID) | RESPIRATORY_TRACT | Status: DC | PRN
  Filled 2021-12-19: qty 6.7

## 2021-12-19 MED ORDER — GABAPENTIN 300 MG PO CAPS
900.0000 mg | ORAL_CAPSULE | Freq: Three times a day (TID) | ORAL | Status: DC
  Administered 2021-12-19 – 2021-12-21 (×7): 900 mg via ORAL
  Filled 2021-12-19 (×7): qty 3

## 2021-12-19 MED ORDER — OXYCODONE-ACETAMINOPHEN 7.5-325 MG PO TABS
1.0000 | ORAL_TABLET | Freq: Three times a day (TID) | ORAL | Status: DC | PRN
  Administered 2021-12-19 – 2021-12-21 (×6): 1 via ORAL
  Filled 2021-12-19 (×6): qty 1

## 2021-12-19 MED ORDER — DEXTROSE IN LACTATED RINGERS 5 % IV SOLN: INTRAVENOUS | Status: DC

## 2021-12-19 MED ORDER — ONDANSETRON HCL 4 MG/2ML IJ SOLN
INTRAMUSCULAR | Status: AC
  Administered 2021-12-19: 4 mg
  Filled 2021-12-19: qty 2

## 2021-12-19 MED ORDER — INSULIN ASPART 100 UNIT/ML IJ SOLN: 20.0000 [IU] | Freq: Once | INTRAMUSCULAR | Status: DC

## 2021-12-19 MED ORDER — MICONAZOLE NITRATE 2 % VA CREA
1.0000 | TOPICAL_CREAM | Freq: Every day | VAGINAL | Status: DC
  Administered 2021-12-19 – 2021-12-20 (×2): 1 via VAGINAL
  Filled 2021-12-19: qty 45

## 2021-12-19 MED ORDER — HEPARIN SODIUM (PORCINE) 5000 UNIT/ML IJ SOLN
5000.0000 [IU] | Freq: Three times a day (TID) | INTRAMUSCULAR | Status: DC
  Administered 2021-12-19 – 2021-12-20 (×2): 5000 [IU] via SUBCUTANEOUS
  Filled 2021-12-19 (×2): qty 1

## 2021-12-19 MED ORDER — INSULIN ASPART 100 UNIT/ML IJ SOLN
8.0000 [IU] | Freq: Once | INTRAMUSCULAR | Status: AC
  Administered 2021-12-19: 8 [IU] via SUBCUTANEOUS

## 2021-12-19 MED ORDER — POTASSIUM CHLORIDE 10 MEQ/100ML IV SOLN
10.0000 meq | INTRAVENOUS | Status: AC
  Administered 2021-12-19 (×2): 10 meq via INTRAVENOUS
  Filled 2021-12-19 (×2): qty 100

## 2021-12-19 MED ORDER — LACTATED RINGERS IV BOLUS
1000.0000 mL | Freq: Once | INTRAVENOUS | Status: AC
Start: 2021-12-19 — End: 2021-12-19
  Administered 2021-12-19: 1000 mL via INTRAVENOUS

## 2021-12-19 MED ORDER — PIPERACILLIN-TAZOBACTAM 3.375 G IVPB
3.3750 g | Freq: Three times a day (TID) | INTRAVENOUS | Status: DC
  Administered 2021-12-19 – 2021-12-20 (×2): 3.375 g via INTRAVENOUS
  Filled 2021-12-19 (×2): qty 50

## 2021-12-19 MED ORDER — HYDROCODONE-ACETAMINOPHEN 5-325 MG PO TABS
1.0000 | ORAL_TABLET | Freq: Once | ORAL | Status: DC
Start: 2021-12-19 — End: 2021-12-21

## 2021-12-19 MED ORDER — ENOXAPARIN SODIUM 80 MG/0.8ML IJ SOSY
75.0000 mg | PREFILLED_SYRINGE | INTRAMUSCULAR | Status: DC
  Filled 2021-12-19: qty 0.75

## 2021-12-19 MED ORDER — MOMETASONE FURO-FORMOTEROL FUM 200-5 MCG/ACT IN AERO
2.0000 | INHALATION_SPRAY | Freq: Two times a day (BID) | RESPIRATORY_TRACT | Status: DC
  Filled 2021-12-19: qty 8.8

## 2021-12-19 MED ORDER — FLUCONAZOLE 150 MG PO TABS
150.0000 mg | ORAL_TABLET | Freq: Once | ORAL | Status: AC
  Administered 2021-12-19: 150 mg via ORAL
  Filled 2021-12-19 (×2): qty 1

## 2021-12-19 MED ORDER — INSULIN ASPART 100 UNIT/ML IJ SOLN
0.0000 [IU] | Freq: Three times a day (TID) | INTRAMUSCULAR | Status: DC
  Administered 2021-12-19: 15 [IU] via SUBCUTANEOUS
  Administered 2021-12-20 – 2021-12-21 (×2): 25 [IU] via SUBCUTANEOUS

## 2021-12-19 MED ORDER — INSULIN ASPART 100 UNIT/ML IJ SOLN
0.0000 [IU] | Freq: Every day | INTRAMUSCULAR | Status: DC
  Administered 2021-12-20: 5 [IU] via SUBCUTANEOUS

## 2021-12-19 MED ORDER — PREDNISOLONE ACETATE 1 % OP SUSP
1.0000 [drp] | OPHTHALMIC | Status: DC
  Administered 2021-12-19 – 2021-12-21 (×16): 1 [drp] via OPHTHALMIC
  Filled 2021-12-19: qty 5

## 2021-12-19 MED ORDER — VANCOMYCIN HCL 1500 MG/300ML IV SOLN
1500.0000 mg | INTRAVENOUS | Status: DC
  Filled 2021-12-19: qty 300

## 2021-12-19 MED ORDER — ATROPINE SULFATE 1 % OP SOLN
1.0000 [drp] | Freq: Every day | OPHTHALMIC | Status: DC
  Administered 2021-12-19 – 2021-12-21 (×3): 1 [drp] via OPHTHALMIC
  Filled 2021-12-19: qty 2

## 2021-12-19 MED ORDER — INSULIN GLARGINE-YFGN 100 UNIT/ML ~~LOC~~ SOLN
35.0000 [IU] | Freq: Once | SUBCUTANEOUS | Status: AC
  Administered 2021-12-19: 35 [IU] via SUBCUTANEOUS
  Filled 2021-12-19: qty 0.35

## 2021-12-19 MED ORDER — LACTATED RINGERS IV BOLUS
1000.0000 mL | Freq: Once | INTRAVENOUS | Status: AC
  Administered 2021-12-19: 1000 mL via INTRAVENOUS

## 2021-12-19 MED ORDER — LACTATED RINGERS IV SOLN: INTRAVENOUS | Status: DC

## 2021-12-19 MED ORDER — PIPERACILLIN-TAZOBACTAM 3.375 G IVPB 30 MIN
3.3750 g | Freq: Once | INTRAVENOUS | Status: AC
  Administered 2021-12-19: 3.375 g via INTRAVENOUS
  Filled 2021-12-19: qty 50

## 2021-12-19 MED ORDER — GABAPENTIN 600 MG PO TABS
1200.0000 mg | ORAL_TABLET | Freq: Once | ORAL | Status: AC
  Administered 2021-12-19: 1200 mg via ORAL
  Filled 2021-12-19: qty 2

## 2021-12-19 MED ORDER — ATORVASTATIN CALCIUM 80 MG PO TABS
80.0000 mg | ORAL_TABLET | Freq: Every day | ORAL | Status: DC
  Administered 2021-12-20 – 2021-12-21 (×2): 80 mg via ORAL
  Filled 2021-12-19 (×2): qty 1

## 2021-12-19 MED ORDER — OXYCODONE-ACETAMINOPHEN 7.5-325 MG PO TABS: 1.0000 | ORAL_TABLET | Freq: Three times a day (TID) | ORAL | Status: DC | PRN

## 2021-12-19 MED ORDER — VANCOMYCIN HCL 10 G IV SOLR
2500.0000 mg | Freq: Once | INTRAVENOUS | Status: AC
  Administered 2021-12-19: 2500 mg via INTRAVENOUS
  Filled 2021-12-19: qty 2500

## 2021-12-19 NOTE — Progress Notes (Signed)
Pharmacy Antibiotic Note  [name] is a 46 y.o. female admitted on 12/18/2021 with  osteomyelitis .  Pharmacy has been consulted for vancomycin dosing.  Patient with a history of T2DM, morbid obesity, obesity hypoventilation syndrome, OSA wears BiPAP at night, hypertension, asthma, GERD, hyperlipidemia, restless leg syndrome, left BKA, mood disorder. Patient presenting with foot pain and found to be in hyperglycemic crisis.  SCr 1.4 - baseline variable WBC 10.9; LA 3.5>3; T 98.4 F  Plan: Vancomycin 2500 mg once then 1500 mg q24hr (eAUC 501.9) unless change in renal function (given previous SCr values will need to monitor closely - especially in setting of BMI 62) Trend WBC, Fever, Renal function, & Clinical course F/u cultures, clinical course, WBC, fever De-escalate when able Levels at steady state  Height: 5\' 2"  (157.5 cm) Weight: (!) 155.6 kg (343 lb) IBW/kg (Calculated) : 50.1  Temp (24hrs), Avg:98.5 F (36.9 C), Min:98.4 F (36.9 C), Max:98.5 F (36.9 C)  Recent Labs  Lab 12/18/21 2328 12/19/21 0800 12/19/21 0811  WBC 10.9*  --   --   CREATININE 1.76*  --  1.40*  LATICACIDVEN 3.5* 3.0*  --     Estimated Creatinine Clearance: 75.5 mL/min (A) (by C-G formula based on SCr of 1.4 mg/dL (H)).    Allergies  Allergen Reactions   Cefepime Other (See Comments)    AKI, see records from [facility] hospitalization in January 2020.   Other reaction(s): Other (See Comments)  Note pt has tolerated Rocephin and Keflex   Kiwi Extract Shortness Of Breath, Swelling and Anaphylaxis   Morphine And Related Nausea And Vomiting   Nalbuphine     Other reaction(s): Hallucinations, Other (See Comments) "FEELS LIKE SOMETHING CRAWLING ON ME" Reaction:  Nervousness "FEELS LIKE SOMETHING CRAWLING ON ME" Reaction:  Nervousness Reaction:  Nervousness "FEELS LIKE SOMETHING CRAWLING ON ME"    Trental [Pentoxifylline] Nausea And Vomiting   Toradol [Ketorolac Tromethamine] Other (See Comments)     Feels like something is crawling on me   Morphine Nausea And Vomiting   Nubain [Nalbuphine Hcl] Other (See Comments)    "FEELS LIKE SOMETHING CRAWLING ON ME"    Antimicrobials this admission: vancomycin 1/14 >>   Microbiology results: Pending  Thank you for allowing pharmacy to be a part of this patients care.  Lorelei Pont, PharmD, BCPS 12/19/2021 11:07 AM ED Clinical Pharmacist -  [phone]

## 2021-12-19 NOTE — H&P (Addendum)
[facility] Admission History and Physical Service Pager: [phone]  Patient name: [name] Medical record number: [id number] Date of birth: [birth year] Age: 46 y.o. Gender: female  Primary Care Provider: [name], MD Consultants: None Code Status: Full Preferred Emergency Contact: Guinevere Ferrari: [phone] Richard Miu: [phone]  Chief Complaint: right foot pain  Assessment and Plan: [name] is a 46 year old female presenting with foot pain found to be in hyperglycemic crisis.  Past medical history significant for T2DM, morbid obesity, obesity hypoventilation syndrome, OSA wears BiPAP at night, hypertension, asthma, GERD, hyperlipidemia, restless leg syndrome, left BKA, mood disorder, history of chest pain with low risk for cardiac etiology.   Right Foot Ulcer  Patient has nonhealing ulcer on her right transmetatarsal amputation site that had worsening pain, draining and bleeding and came to ED for evaluation.  Has remained afebrile.  On presentation vitals stable. R foot with ulcer, non draining with center area of necrosis at base of 1st metatarsal. Right foot x-ray showed peripheral soft tissue swelling and stump ulceration without definite evidence of osteomyelitis. R foot MRI showed mild bone marrow edema and enhancement that is suspicious for early acute osteomyelitis. Elevated LA more likely secondary to hyperglycemic crisis rather than from wound. Vital signs in the ED showed patient being afebrile, normotensive and saturating well on room air.  Labs in the ED showed hyperglycemia to 904, AKI, lactic acidosis at 3.5, mildly elevated WBC to 10.9 and hemoglobin around her threshold at 11.9. Na was 121 but corrected to 140. She was given 1 dose of vancomycin, and started on insulin drip.  -Admit to FPTS, attending Dr. Erin Hearing  -Ortho following, appreciate recs -Vitals per floor protocol  - Vancomycin and Zosyn  -Wound care consult   -Monitor fever curve  -Tylenol 650 mg q6h prn  -Cardiac monitoring  -Pulse ox, keep O2 sats >88%  -PT/OT eval   HHS   Hyperglycemic crisis   poorly controlled T2DM  Glucose of 904 on admission.  Anion gap was elevated to 15.  Had venous blood gas showed pH of 7.411, pCO2 42.9, pO2 37, bicarb 27.2. Patient meets criteria for HHS. Last A1c 09/29/21 of >15. Home medications include Tresiba 70 uunits twice daily, Novolog 60 units with meals TID, Trulicity weekly, Gabapentin 1200 mg TID. -Keep n.p.o. while on insulin drip -BMP q4h -F/u UA  -mIVF LR 125 mL/HR  -D5 LR 125 mL/HR to start when CBGs less than 250  -Monitor potassium and replete as necessary  -CBG q1h -BMP q4h -Trend K, replete as needed -Start Gabapentin at 900 mg TID, per pharm recs   Right Ireitis Patient comes from opthalmology appointment where she was noted to have an Iretis, taking atropine and prednisolone ophthalmic drops -Continue atropine 1%, 1 drop daily -Continue prednisolone 1%, 1 drop q2h while awake  Recent symptoms of yeast infection  Had been worsening and ongoing. Was given fluconazole x 1 and terconazole vaginal cream.  -Monitor symptoms  -Consider wet prep  -Continue fluconazole and terconazole  AKI on CKD3a  Creatinine of 1.76 in ED. Most recent values around 1.3-1.4. Did have acute renal failure on last admission 10/2021.  -mIVF -Avoid nephrotoxic agents  -Monitor on BMP   HLD  Home medication of atorvastatin 80 mg daily, although patient not taking. Last Lipid panel 02/10/21 with total cholesterol of 330, LDL 192, TG 486. Patient will benefit from statin therapy, will restart.  -Restart atovastatin  -Lipid panel   OSA  Wears BiPAP 3  L at night  -Continue BiPAP while in hospital   Asthma   obesity hypoventilation syndrome  Home medications of Symbicort 2 puffs twice daily, and albuterol as needed.  Ruthe Mannan twice daily in hospital  -Albuterol prn  Iron deficiency anemia  Hemoglobin  stable at 11.9 around her baseline.  Home medication of ferrous sulfate 325 mg every other day, patient not taking.  -Monitor on CBC  -Hold ferrous sulfate  HFpEF  Last echo 02/2021 showed EF 55-60% with difficulty visualizing diastolic dysfunction likely secondary to habitus. Has responded well to torsemide in the past and has some presumed heart failure based on chart review. Home medication of torsemide 80 mg daily.  -Hold torsemide in setting of AKI\  -Monitor fluid status   GERD  Home medication of Omeprazole 40 mg daily, patient not taking. -Hold protonix 40 mg while in hospital   Vitamin D deficiency  Home medication of Cholecalciferol 1000 units daily, patient not taking -Hold home medication   RLS  Home medication of Ropinirole 1 mg at bedtime, patient not taking -Hold home medication   Insomnia  Home medication of ramelteon 8 mg at bedtime.  -Continue home medication   Hx of hypokalemia  Takes home medication of ootassium chloride 20 mEq daily. 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 No know history of cardiac events. Currently prescribed aspirin 81 mg daily, patient not taking. Patient also prescribed torsemide 80 mg daily for presumed HFpEF.  -Continue aspirin  -Hold torsemide for AKI  Chronic Pain  Mostly back and knee pain from morbid obesity. Home medication of percocet 7.5 mg q8h prn. Per PDMP last filled 12/04/21 for 30 days.  -Continue home medication.    FEN/GI: NPO Prophylaxis: Lovenox  Disposition: Admit to inpatient  History of Present Illness:  [name] is a 46 y.o. female presenting with foot pain and hyperglycemia.  Patient had been called by attending, Dr. Dorris Singh, per note 1/13 with concern of a nonhealing ulcer on her  right transmetatarsal  amputation site that had worsening draining and bleeding. She also reported a worsening of her yeast infection with very high glucose levels at home.  She was advised to go to ED for evaluation of her biggest concern which was her foot ulcer.    She states the biggest thing that brought her in here was her foot- has a wound that wont heal. Wound gets down to the size of a pin drop but then well get large again. She's been dealing with it for the past year but last few days got larger. Endorses sharp pain that comes and goes, occurring more frequently. Denies fevers, chills, has had nausea today that started this morning, vomiting x1. Does endorse sleeping a lot about 12 hours a night, denies cough, runny nose, chest pain.   Patient also notes that she missed her [place] and Novolog last night. She endorses feeling extra thirsty, but no increased urination. Patient also states she has a yeast infection. She received a prescription of a medication from Dr. Dorris Singh, but she did not pick up the medication yet. Endorses feeling itchy and red for the past 2 months. She has tried using a cream, but that didn't help. She noted that 1 pill of Diflucan was not enough in past, and felt like she  needed another pill to take, along with the cream.  For her right eye, eye doctor says eye was inflamed and ED provider ordered her eye drops . She states that her eye is very sensitive to light. Since Tuesday, after getting dilation at the eye doctor, she started to have headaches, facial pain, and sensitivity to light.    Review Of Systems: Per HPI with the following additions:   Review of Systems   Patient Active Problem List   Diagnosis Date Noted   Stage 3a chronic kidney disease (CKD) ([place]) 34/19/3790   Diastolic HF (heart failure) ([place]) 10/19/2021   Abscess 10/16/2021   Abscess of groin, left    Impaired mobility 07/07/2021   Chest pain with low risk for cardiac etiology 05/11/2021   Morbid obesity with  BMI of 60.0-69.9, adult ([place]) 04/15/2021   Hypoxia 04/15/2021   UTI (urinary tract infection) 04/15/2021   Chronic pain 04/15/2021   Pneumonia due to COVID-19 virus 04/14/2021   Urinary incontinence 10/23/2020   Elevated troponin    Acute respiratory failure with hypoxia and hypercarbia ([name]) 06/15/2020   Chronic respiratory failure with hypoxia ([place]) 06/14/2020   Blister of foot 04/21/2020   Exposure to mold 04/21/2020   Constipation 04/03/2020   Left shoulder pain 06/23/2019   Dysuria 06/14/2019   Hyperglycemia due to diabetes mellitus ([place])    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[place]) 04/11/2019   Allergic rhinitis 03/06/2018   Class 3 severe obesity due to excess calories with serious comorbidity and body mass index (BMI) of 50.0 to 59.9 in adult [facility])    AKI (acute kidney injury) ([place])    Decreased pedal pulses 06/10/2017   Unilateral primary osteoarthritis, right knee 10/22/2016   Primary osteoarthritis of first carpometacarpal joint of left hand 07/30/2016   Diabetic neuropathy ([place]) 07/14/2016   Nausea 03/17/2016   De Quervain's tenosynovitis, bilateral 11/01/2015   Vitamin D deficiency 09/05/2015   Recurrent candidiasis of vagina 09/05/2015   Varicose veins of leg with complications 24/08/7352   Restless leg syndrome 10/17/2014   Chronic sinusitis 07/18/2014   Insomnia 08/14/2013   Encounter for chronic pain management 06/30/2013   HLD (hyperlipidemia) 11/19/2012   Angina pectoris ([place]) 06/27/2012   Abscess of skin and subcutaneous tissue 11/04/2011   Right carpal tunnel syndrome 09/01/2011   Bilateral knee pain 09/01/2011   Type 2 diabetes mellitus with neurological complications ([place]) 29/92/4268   Morbid obesity ([place]) 05/22/2008   Obesity hypoventilation syndrome ([place]) 05/22/2008   Mood disorder ([place]) 05/22/2008   OSA (obstructive sleep apnea) 05/22/2008   Hypertension 05/22/2008   Asthma 05/22/2008   GERD 05/22/2008    Past Medical History: Past Medical History:   Diagnosis Date   Acute osteomyelitis of ankle or foot, left ([place]) 01/31/2018   Alveolar hypoventilation    Anemia    not on iron pill   Asthma    Bipolar 2 disorder ([name])    Carpal tunnel syndrome on right    recurrent   Cellulitis 08/2010-08/2011   Chronic pain    COPD (chronic obstructive pulmonary disease) ([name])    Symbicort daily and Proventil as needed   Costochondritis    Diabetes mellitus type II, uncontrolled 2000   Type 2, Uncontrolled.Takes Lantus daily.Fasting blood sugar runs 150   Drug-seeking behavior    GERD (gastroesophageal reflux disease)    takes Pantoprazole and Zantac daily   HLD (hyperlipidemia)    takes Atorvastatin daily   Hypertension    takes Lisinopril and Coreg daily  Morbid obesity ([name])    Nocturia    OSA on CPAP    Peripheral neuropathy    takes Gabapentin daily   Pneumonia    "walking" several yrs ago and as a baby (12/05/2018)   Rectal fissure    Restless leg    SVT (supraventricular tachycardia) ([name])    Syncope 02/25/2016   Urinary frequency    Urinary incontinence 10/23/2020   Varicose veins    Right medial thigh and Left leg     Past Surgical History: Past Surgical History:  Procedure Laterality Date   AMPUTATION Left 02/01/2018   Procedure: LEFT FOURTH AND 5TH TOE RAY AMPUTATION;  Surgeon: [name], MD;  Location: [facility];  Service: Orthopedics;  Laterality: Left;   AMPUTATION Left 03/03/2018   Procedure: LEFT BELOW KNEE AMPUTATION;  Surgeon: [name], MD;  Location: [facility];  Service: Orthopedics;  Laterality: Left;   CARPAL TUNNEL RELEASE Bilateral    CESAREAN SECTION  2007   CORONARY ANGIOGRAPHY N/A 11/26/2020   Procedure: CORONARY ANGIOGRAPHY;  Surgeon: Sherren Mocha, MD;  Location: [facility];  Service: Cardiovascular;  Laterality: N/A;   INCISION AND DRAINAGE ABSCESS Left 10/16/2021   Procedure: INCISION AND DRAINAGE ABSCESS;  Surgeon: Dwan Bolt, MD;  Location: [facility];  Service: General;  Laterality:  Left;   INCISION AND DRAINAGE PERIRECTAL ABSCESS Left 05/18/2019   Procedure: IRRIGATION AND DEBRIDEMENT OF PANNIS ABSCESS, POSSIBLE DEBRIDEMENT OF BUTTOCK WOUND;  Surgeon: Donnie Mesa, MD;  Location: [facility];  Service: General;  Laterality: Left;   IRRIGATION AND DEBRIDEMENT BUTTOCKS Left 05/17/2019   Procedure: IRRIGATION AND DEBRIDEMENT BUTTOCKS;  Surgeon: Donnie Mesa, MD;  Location: [facility];  Service: General;  Laterality: Left;   KNEE ARTHROSCOPY Right 07/17/2010   LEFT HEART CATHETERIZATION WITH CORONARY ANGIOGRAM N/A 07/27/2012   Procedure: LEFT HEART CATHETERIZATION WITH CORONARY ANGIOGRAM;  Surgeon: Sherren Mocha, MD;  Location[facility];  Service: Cardiovascular;  Laterality: N/A;   MASS EXCISION N/A 06/29/2013   Procedure:  WIDE LOCAL EXCISION OF POSTERIOR NECK ABSCESS;  Surgeon: Ralene Ok, MD;  Location: [facility];  Service: General;  Laterality: N/A;   REPAIR KNEE LIGAMENT Left    "fixed ligaments and chipped patella"   right transmetatarsal amputation       Social History: Social History   Tobacco Use   Smoking status: Former    Packs/day: 0.25    Years: 15.00    Pack years: 3.75    Types: Cigarettes    Quit date: 12/06/2005    Years since quitting: 16.0   Smokeless tobacco: Never   Tobacco comments:    smokes for a couple of months  Vaping Use   Vaping Use: Never used  Substance Use Topics   Alcohol use: No    Alcohol/week: 0.0 standard drinks   Drug use: Not Currently    Comment: OD attempts on home meds       Family History: Family History  Problem Relation Age of Onset   Diabetes Mother    Hyperlipidemia Mother    Depression Mother    GER disease Mother    Allergic rhinitis Mother    Restless legs syndrome Mother    Heart attack Paternal Uncle    Heart disease Paternal Grandmother    Heart attack Paternal Grandmother    Heart attack Paternal Grandfather    Heart disease Paternal Grandfather    Heart attack Father    Migraines Sister    Cancer  Maternal Grandmother        COLON   Hypertension Maternal Grandmother    Hyperlipidemia Maternal Grandmother    Diabetes Maternal Grandmother    Other Maternal Grandfather        GUN SHOT   Anxiety disorder Sister    Asthma Child      Allergies and Medications: Allergies  Allergen Reactions   Cefepime Other (See Comments)    AKI, see records from [facility] hospitalization in January 2020.   Other reaction(s): Other (See Comments)  Note pt has tolerated Rocephin and Keflex   Kiwi Extract Shortness Of Breath, Swelling and Anaphylaxis   Morphine And Related Nausea And Vomiting   Nalbuphine     Other reaction(s): Hallucinations, Other (See Comments) "FEELS LIKE SOMETHING CRAWLING ON ME" Reaction:  Nervousness "FEELS LIKE SOMETHING CRAWLING ON ME" Reaction:  Nervousness Reaction:  Nervousness "FEELS LIKE SOMETHING CRAWLING ON ME"    Trental [Pentoxifylline] Nausea And Vomiting   Toradol [Ketorolac Tromethamine] Other (See Comments)    Feels like something is crawling on me   Morphine Nausea And Vomiting   Nubain [Nalbuphine Hcl] Other (See Comments)    "FEELS LIKE SOMETHING CRAWLING ON ME"   No current facility-administered medications on file prior to encounter.   Current Outpatient Medications on File Prior to Encounter  Medication Sig Dispense Refill   Accu-Chek FastClix Lancets MISC USE DAILY AS INSTRUCTED 102 each 3   ACCU-CHEK GUIDE test strip USE T0 CHECK BLOOD SUGAR 3 TIMES DAILY 100 strip 5   Accu-Chek Softclix Lancets lancets Use to check blood glucose four times daily 100 each 12   albuterol (VENTOLIN HFA) 108 (90 Base) MCG/ACT inhaler INHALE 1 TO 2 PUFFS BY MOUTH EVERY SIX HOURS AS NEEDED FOR WHEEZING OR SHORTNESS OF BREATH (Patient taking differently: Inhale 1 puff into the lungs every 6 (six) hours as needed for shortness of breath.) 6.7 g 4   amLODipine (NORVASC) 10 MG tablet Take 1 tablet by mouth daily.     aspirin EC 81 MG tablet Take 1 tablet (81 mg total) by  mouth daily. Swallow whole. 90 tablet 3   atorvastatin (LIPITOR) 80 MG tablet Take 1 tablet (80 mg total) by mouth daily. 90 tablet 3   Cholecalciferol 1000 units tablet Take 1 tablet (1,000 Units total) by mouth daily. 90 tablet 0   Continuous Blood Gluc Sensor (DEXCOM G6 SENSOR) MISC Inject 1 applicator into the skin as directed. Change sensor every 10 days. 3 each 11   Continuous Blood Gluc Transmit (DEXCOM G6 TRANSMITTER) MISC Inject 1 Device into the skin as directed. Reuse 8 times with sensor changes. 1 each 3   Ferrous Sulfate (IRON) 325 (65 Fe) MG TABS Take 1 tablet (325 mg total) by mouth every other day. 45 tablet 0   gabapentin (NEURONTIN) 600 MG tablet Take 1 tablet (600 mg total) by mouth at bedtime. 180 tablet 4   GNP ULTICARE PEN NEEDLES 32G X 4 MM MISC USE TO inject insulin 2 TIMES DAILY 100 each 2   insulin aspart (NOVOLOG FLEXPEN) 100 UNIT/ML FlexPen Inject 20 Units into the skin 3 (three) times daily with meals. 45 mL 3   insulin degludec (TRESIBA FLEXTOUCH) 200 UNIT/ML FlexTouch Pen Inject 46 Units into the skin 2 (two) times daily. 30 mL 3   metoprolol succinate (TOPROL-XL) 25 MG 24 hr tablet Take 25 mg by mouth daily.     Misc. Devices (TRANSFER BENCH) MISC Use daily 1 each 0   omeprazole (PRILOSEC)  40 MG capsule Take 1 capsule (40 mg total) by mouth daily. 30 capsule 3   ondansetron (ZOFRAN-ODT) 4 MG disintegrating tablet Dissolve 1 tablet under the tongue every 8 hours as needed for nausea. (Patient taking differently: Take 4 mg by mouth every 8 (eight) hours as needed for vomiting or nausea.) 10 tablet 0   oxyCODONE-acetaminophen (PERCOCET) 7.5-325 MG tablet TAKE 1 TABLET BY MOUTH EVERY 8 HOURS AS NEEDED FOR SEVERE PAIN 90 tablet 0   [START ON 01/02/2022] oxyCODONE-acetaminophen (PERCOCET) 7.5-325 MG tablet Take 1 tablet by mouth every 8 (eight) hours as needed for severe pain. 90 tablet 0   potassium chloride SA (KLOR-CON M20) 20 MEQ tablet Take 1 tablet (20 mEq total) by  mouth daily. 30 tablet 6   ramelteon (ROZEREM) 8 MG tablet TAKE 1 TABLET BY MOUTH AT BEDTIME 30 tablet 1   rOPINIRole (REQUIP) 1 MG tablet Take 1 tablet (1 mg total) by mouth at bedtime. 90 tablet 0   SYMBICORT 160-4.5 MCG/ACT inhaler INHALE 2 PUFFS INTO THE LUNGS 2 TIMES DAILY (Patient taking differently: Inhale 2 puffs into the lungs 2 (two) times daily.) 10.2 g 4   terconazole (TERAZOL 7) 0.4 % vaginal cream Place 1 applicator vaginally at bedtime. 45 g 0   Tiotropium Bromide Monohydrate (SPIRIVA RESPIMAT) 1.25 MCG/ACT AERS Inhale 2 puffs into the lungs daily. 1 each 0   torsemide (DEMADEX) 20 MG tablet TAKE 4 TABLETS BY MOUTH ONCE A DAY 573 tablet 2   TRULICITY 1.5 UK/0.2RK SOPN inject 1.5mg  into THE SKIN ONCE A WEEK (Patient taking differently: Inject 1.75 mg as directed once a week.) 2 mL 5    Objective: BP 103/70    Pulse 80    Temp 98.5 F (36.9 C) (Oral)    Resp 18    SpO2 93%  Exam: General: Well appearing, obese, joking, NAD, white woman Eyes: EOM intact, clear conjunctiva ENTM: MMM Neck: Soft Cardiovascular: RRR, NRMG Respiratory: CTABL Gastrointestinal: Soft, NTTP, non-distended MSK: Moving all extremities independently, no edema Neuro: Alert and interactive, conversant, appropriate mentation Psych: Good mood, polite affect  Labs and Imaging: CBC BMET  Recent Labs  Lab 12/18/21 2328 12/19/21 0810 12/19/21 0811  WBC 10.9*  --   --   HGB 11.9*   < > 14.6  HCT 37.6   < > 43.0  PLT 353  --   --    < > = values in this interval not displayed.   Recent Labs  Lab 12/18/21 2328 12/19/21 0810 12/19/21 0811  NA 121*   < > 121*  K 4.2   < > 4.4  CL 81*  --  87*  CO2 25  --   --   BUN 53*  --  69*  CREATININE 1.76*  --  1.40*  GLUCOSE 904*  --  >700*  CALCIUM 8.7*  --   --    < > = values in this interval not displayed.     EKG: My own interpretation (not copied from electronic read) Normal Sinus    Holley Bouche, MD 12/19/2021, 9:10 AM PGY-1, [place] Intern pager: [phone], text pages welcome   FPTS Upper-Level Resident Addendum   I have independently interviewed and examined the patient. I have discussed the above with the original author and agree with their documentation. My edits for correction/addition/clarification are included. Please see also any attending notes.   Shary Key, D.O. PGY-2, [facility] 12/19/2021 8:24 PM  [place] Service pager: [phone] (text pages welcome through [facility])

## 2021-12-19 NOTE — ED Notes (Signed)
Patient transported to MRI 

## 2021-12-19 NOTE — Progress Notes (Signed)
Ortho Note  I was called regarding this patient. She had previous transmet amputation on her right foot and BKA on the left. She presents with chronic wound and worsening pain and redness of right foot. MRI performed shows cellulitis and some edema changes in the residual first metatarsal with possibility of early osteomyelitis. I would recommend broad spectrum antibiotics and blood sugar control. From the appearance of the MRI I would doubt surgical debridement will be required during this hospitalization. Formal consult to follow later today or tomorrow.  Shona Needles, MD [facility] [phone] (office) [email or web address]

## 2021-12-19 NOTE — ED Notes (Signed)
Admitting paged to RN per her request 

## 2021-12-19 NOTE — ED Notes (Signed)
Admitting repaged per RN request

## 2021-12-19 NOTE — ED Provider Notes (Addendum)
[facility] EMERGENCY DEPARTMENT Provider Note  CSN: [id number] Arrival date & time: 12/18/21 2308  Chief Complaint(s) Foot Pain  HPI [name] is a 46 y.o. female with PMH T2 insulin-dependent diabetes, right-sided osteomyelitis status post partial amputation of the foot, left-sided osteomyelitis status post BKA who presents emergency department for evaluation of right-sided foot pain.  Patient recently went to an ophthalmologist yesterday and was diagnosed with iritis so arrives with photosensitivity due to pupillary dilation and her underlying iritis.  She has not started her eyedrops at this time.  She states that yesterday her right foot started hurting significantly worse than usual and she noticed an ulceration at the distal tip.  She arrives with a significantly elevated glucose and associated nausea.  Denies abdominal pain, chest pain, shortness of breath, fevers or other systemic symptoms.   Foot Pain   Past Medical History Past Medical History:  Diagnosis Date   Acute osteomyelitis of ankle or foot, left ([place]) 01/31/2018   Alveolar hypoventilation    Anemia    not on iron pill   Asthma    Bipolar 2 disorder ([name])    Carpal tunnel syndrome on right    recurrent   Cellulitis 08/2010-08/2011   Chronic pain    COPD (chronic obstructive pulmonary disease) ([name])    Symbicort daily and Proventil as needed   Costochondritis    Diabetes mellitus type II, uncontrolled 2000   Type 2, Uncontrolled.Takes Lantus daily.Fasting blood sugar runs 150   Drug-seeking behavior    GERD (gastroesophageal reflux disease)    takes Pantoprazole and Zantac daily   HLD (hyperlipidemia)    takes Atorvastatin daily   Hypertension    takes Lisinopril and Coreg daily   Morbid obesity ([name])    Nocturia    OSA on CPAP    Peripheral neuropathy    takes Gabapentin daily   Pneumonia    "walking" several yrs ago and as a baby (12/05/2018)   Rectal fissure    Restless leg     SVT (supraventricular tachycardia) ([place])    Syncope 02/25/2016   Urinary frequency    Urinary incontinence 10/23/2020   Varicose veins    Right medial thigh and Left leg    Patient Active Problem List   Diagnosis Date Noted   Stage 3a chronic kidney disease (CKD) ([place]) 23/55/7322   Diastolic HF (heart failure) ([place]) 10/19/2021   Abscess 10/16/2021   Abscess of groin, left    Impaired mobility 07/07/2021   Chest pain with low risk for cardiac etiology 05/11/2021   Morbid obesity with BMI of 60.0-69.9, adult ([place]) 04/15/2021   Hypoxia 04/15/2021   UTI (urinary tract infection) 04/15/2021   Chronic pain 04/15/2021   Pneumonia due to COVID-19 virus 04/14/2021   Urinary incontinence 10/23/2020   Elevated troponin    Acute respiratory failure with hypoxia and hypercarbia ([place]) 06/15/2020   Chronic respiratory failure with hypoxia ([place]) 06/14/2020   Blister of foot 04/21/2020   Exposure to mold 04/21/2020   Constipation 04/03/2020   Left shoulder pain 06/23/2019   Dysuria 06/14/2019   Hyperglycemia due to diabetes mellitus ([place])    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[place]) 04/11/2019   Allergic rhinitis 03/06/2018   Class 3 severe obesity due to excess calories with serious comorbidity and body mass index (BMI) of 50.0 to 59.9 in adult [facility])    AKI (acute kidney injury) ([place])    Decreased pedal pulses 06/10/2017   Unilateral primary osteoarthritis, right knee 10/22/2016  Primary osteoarthritis of first carpometacarpal joint of left hand 07/30/2016   Diabetic neuropathy ([place]) 07/14/2016   Nausea 03/17/2016   De Quervain's tenosynovitis, bilateral 11/01/2015   Vitamin D deficiency 09/05/2015   Recurrent candidiasis of vagina 09/05/2015   Varicose veins of leg with complications 46/56/8127   Restless leg syndrome 10/17/2014   Chronic sinusitis 07/18/2014   Insomnia 08/14/2013   Encounter for chronic pain management 06/30/2013   HLD (hyperlipidemia) 11/19/2012   Angina pectoris ([place])  06/27/2012   Abscess of skin and subcutaneous tissue 11/04/2011   Right carpal tunnel syndrome 09/01/2011   Bilateral knee pain 09/01/2011   Type 2 diabetes mellitus with neurological complications ([place]) 51/70/0174   Morbid obesity ([place]) 05/22/2008   Obesity hypoventilation syndrome ([place]) 05/22/2008   Mood disorder ([place]) 05/22/2008   OSA (obstructive sleep apnea) 05/22/2008   Hypertension 05/22/2008   Asthma 05/22/2008   GERD 05/22/2008   Home Medication(s) Prior to Admission medications   Medication Sig Start Date End Date Taking? Authorizing Provider  Accu-Chek FastClix Lancets MISC USE DAILY AS INSTRUCTED 05/18/21   [name], MD  ACCU-CHEK GUIDE test strip USE T0 CHECK BLOOD SUGAR 3 TIMES DAILY 11/05/21   [name], MD  Accu-Chek Softclix Lancets lancets Use to check blood glucose four times daily 04/17/20   Martyn Malay, MD  albuterol (VENTOLIN HFA) 108 (90 Base) MCG/ACT inhaler INHALE 1 TO 2 PUFFS BY MOUTH EVERY SIX HOURS AS NEEDED FOR WHEEZING OR SHORTNESS OF BREATH Patient taking differently: Inhale 1 puff into the lungs every 6 (six) hours as needed for shortness of breath. 07/16/21   [name], MD  amLODipine (NORVASC) 10 MG tablet Take 1 tablet by mouth daily. 12/28/18   [provider]  aspirin EC 81 MG tablet Take 1 tablet (81 mg total) by mouth daily. Swallow whole. 11/19/20   Sherren Mocha, MD  atorvastatin (LIPITOR) 80 MG tablet Take 1 tablet (80 mg total) by mouth daily. 02/11/21   [name], MD  Cholecalciferol 1000 units tablet Take 1 tablet (1,000 Units total) by mouth daily. 04/05/17   [name], MD  Continuous Blood Gluc Sensor (DEXCOM G6 SENSOR) MISC Inject 1 applicator into the skin as directed. Change sensor every 10 days. 11/05/21   [name], MD  Continuous Blood Gluc Transmit (DEXCOM G6 TRANSMITTER) MISC Inject 1 Device into the skin as directed. Reuse 8 times with sensor changes. 09/03/21    [name], MD  Ferrous Sulfate (IRON) 325 (65 Fe) MG TABS Take 1 tablet (325 mg total) by mouth every other day. 03/31/21   [name], MD  gabapentin (NEURONTIN) 600 MG tablet Take 1 tablet (600 mg total) by mouth at bedtime. 10/25/21   Lyndee Hensen, DO  GNP ULTICARE PEN NEEDLES 32G X 4 MM MISC USE TO inject insulin 2 TIMES DAILY 10/02/21   [name], MD  insulin aspart (NOVOLOG FLEXPEN) 100 UNIT/ML FlexPen Inject 20 Units into the skin 3 (three) times daily with meals. 10/25/21   Brimage, Ronnette Juniper, DO  insulin degludec (TRESIBA FLEXTOUCH) 200 UNIT/ML FlexTouch Pen Inject 46 Units into the skin 2 (two) times daily. 10/25/21   Lyndee Hensen, DO  metoprolol succinate (TOPROL-XL) 25 MG 24 hr tablet Take 25 mg by mouth daily. 10/23/20   [provider]  Misc. Devices (TRANSFER BENCH) MISC Use daily 10/29/20   [name], MD  omeprazole (PRILOSEC) 40 MG capsule Take 1 capsule (40 mg total) by  mouth daily. 11/14/20   Martyn Ehrich, NP  ondansetron (ZOFRAN-ODT) 4 MG disintegrating tablet Dissolve 1 tablet under the tongue every 8 hours as needed for nausea. Patient taking differently: Take 4 mg by mouth every 8 (eight) hours as needed for vomiting or nausea. 06/29/21   [name], MD  oxyCODONE-acetaminophen (PERCOCET) 7.5-325 MG tablet TAKE 1 TABLET BY MOUTH EVERY 8 HOURS AS NEEDED FOR SEVERE PAIN 12/02/21   Martyn Malay, MD  oxyCODONE-acetaminophen (PERCOCET) 7.5-325 MG tablet Take 1 tablet by mouth every 8 (eight) hours as needed for severe pain. 01/02/22   Martyn Malay, MD  potassium chloride SA (KLOR-CON M20) 20 MEQ tablet Take 1 tablet (20 mEq total) by mouth daily. 09/09/20   Richardson Dopp T, PA-C  ramelteon (ROZEREM) 8 MG tablet TAKE 1 TABLET BY MOUTH AT BEDTIME 12/02/21   Martyn Malay, MD  rOPINIRole (REQUIP) 1 MG tablet Take 1 tablet (1 mg total) by mouth at bedtime. 08/12/20   [name], MD  SYMBICORT 160-4.5  MCG/ACT inhaler INHALE 2 PUFFS INTO THE LUNGS 2 TIMES DAILY Patient taking differently: Inhale 2 puffs into the lungs 2 (two) times daily. 07/16/21   [name], MD  terconazole (TERAZOL 7) 0.4 % vaginal cream Place 1 applicator vaginally at bedtime. 12/18/21   Martyn Malay, MD  Tiotropium Bromide Monohydrate (SPIRIVA RESPIMAT) 1.25 MCG/ACT AERS Inhale 2 puffs into the lungs daily. 11/14/20   Martyn Ehrich, NP  torsemide (DEMADEX) 20 MG tablet TAKE 4 TABLETS BY MOUTH ONCE A DAY 11/25/21   Chambliss, Jeb Levering, MD  TRULICITY 1.5 GY/6.5LD SOPN inject 1.5mg  into THE SKIN ONCE A WEEK Patient taking differently: Inject 1.75 mg as directed once a week. 05/01/21   [name], MD                                                                                                                                    Past Surgical History Past Surgical History:  Procedure Laterality Date   AMPUTATION Left 02/01/2018   Procedure: LEFT FOURTH AND 5TH TOE RAY AMPUTATION;  Surgeon: [name], MD;  Location: [place];  Service: Orthopedics;  Laterality: Left;   AMPUTATION Left 03/03/2018   Procedure: LEFT BELOW KNEE AMPUTATION;  Surgeon: [name], MD;  Location: [place];  Service: Orthopedics;  Laterality: Left;   CARPAL TUNNEL RELEASE Bilateral    CESAREAN SECTION  2007   CORONARY ANGIOGRAPHY N/A 11/26/2020   Procedure: CORONARY ANGIOGRAPHY;  Surgeon: Sherren Mocha, MD;  Location: [place] CV [facility];  Service: Cardiovascular;  Laterality: N/A;   INCISION AND DRAINAGE ABSCESS Left 10/16/2021   Procedure: INCISION AND DRAINAGE ABSCESS;  Surgeon: Dwan Bolt, MD;  Location: [place];  Service: General;  Laterality: Left;   INCISION AND DRAINAGE PERIRECTAL ABSCESS Left 05/18/2019   Procedure: IRRIGATION AND DEBRIDEMENT OF PANNIS ABSCESS, POSSIBLE DEBRIDEMENT OF BUTTOCK  WOUND;  Surgeon: Donnie Mesa, MD;  Location: [place];  Service: General;  Laterality: Left;   IRRIGATION AND  DEBRIDEMENT BUTTOCKS Left 05/17/2019   Procedure: IRRIGATION AND DEBRIDEMENT BUTTOCKS;  Surgeon: Donnie Mesa, MD;  Location: [place];  Service: General;  Laterality: Left;   KNEE ARTHROSCOPY Right 07/17/2010   LEFT HEART CATHETERIZATION WITH CORONARY ANGIOGRAM N/A 07/27/2012   Procedure: LEFT HEART CATHETERIZATION WITH CORONARY ANGIOGRAM;  Surgeon: Sherren Mocha, MD;  [facility];  Service: Cardiovascular;  Laterality: N/A;   MASS EXCISION N/A 06/29/2013   Procedure:  WIDE LOCAL EXCISION OF POSTERIOR NECK ABSCESS;  Surgeon: Ralene Ok, MD;  Location: [place];  Service: General;  Laterality: N/A;   REPAIR KNEE LIGAMENT Left    "fixed ligaments and chipped patella"   right transmetatarsal amputation      Family History Family History  Problem Relation Age of Onset   Diabetes Mother    Hyperlipidemia Mother    Depression Mother    GER disease Mother    Allergic rhinitis Mother    Restless legs syndrome Mother    Heart attack Paternal Uncle    Heart disease Paternal Grandmother    Heart attack Paternal Grandmother    Heart attack Paternal Grandfather    Heart disease Paternal Grandfather    Heart attack Father    Migraines Sister    Cancer Maternal Grandmother        COLON   Hypertension Maternal Grandmother    Hyperlipidemia Maternal Grandmother    Diabetes Maternal Grandmother    Other Maternal Grandfather        GUN SHOT   Anxiety disorder Sister    Asthma Child     Social History Social History   Tobacco Use   Smoking status: Former    Packs/day: 0.25    Years: 15.00    Pack years: 3.75    Types: Cigarettes    Quit date: 12/06/2005    Years since quitting: 16.0   Smokeless tobacco: Never   Tobacco comments:    smokes for a couple of months  Vaping Use   Vaping Use: Never used  Substance Use Topics   Alcohol use: No    Alcohol/week: 0.0 standard drinks   Drug use: Not Currently    Comment: OD attempts on home meds     Allergies Cefepime, Kiwi  extract, Morphine and related, Nalbuphine, Trental [pentoxifylline], Toradol [ketorolac tromethamine], Morphine, and Nubain [nalbuphine hcl]  Review of Systems Review of Systems  Eyes:  Positive for photophobia and redness.  Gastrointestinal:  Positive for nausea.  Musculoskeletal:  Positive for arthralgias.   Physical Exam Vital Signs  I have reviewed the triage vital signs BP 132/69    Pulse 85    Temp 98.5 F (36.9 C) (Oral)    Resp 14    SpO2 94%   Physical Exam Vitals and nursing note reviewed.  Constitutional:      General: She is not in acute distress.    Appearance: She is well-developed.  HENT:     Head: Normocephalic and atraumatic.  Eyes:     Comments: Right-sided conjunctival injection  Cardiovascular:     Rate and Rhythm: Normal rate and regular rhythm.     Heart sounds: No murmur heard. Pulmonary:     Effort: Pulmonary effort is normal. No respiratory distress.     Breath sounds: Normal breath sounds.  Abdominal:     Palpations: Abdomen is soft.     Tenderness: There is  no abdominal tenderness.  Musculoskeletal:        General: Tenderness present. No swelling.     Cervical back: Neck supple.  Skin:    General: Skin is warm and dry.     Capillary Refill: Capillary refill takes less than 2 seconds.     Findings: Lesion (Small ulceration at the base of the first metatarsal on the right foot) present.  Neurological:     Mental Status: She is alert.  Psychiatric:        Mood and Affect: Mood normal.    ED Results and Treatments Labs (all labs ordered are listed, but only abnormal results are displayed) Labs Reviewed  CBC WITH DIFFERENTIAL/PLATELET - Abnormal; Notable for the following components:      Result Value   WBC 10.9 (*)    Hemoglobin 11.9 (*)    Abs Immature Granulocytes 0.09 (*)    All other components within normal limits  COMPREHENSIVE METABOLIC PANEL - Abnormal; Notable for the following components:   Sodium 121 (*)    Chloride 81 (*)     Glucose, Bld 904 (*)    BUN 53 (*)    Creatinine, Ser 1.76 (*)    Calcium 8.7 (*)    Albumin 2.8 (*)    Alkaline Phosphatase 200 (*)    GFR, Estimated 36 (*)    All other components within normal limits  LACTIC ACID, PLASMA - Abnormal; Notable for the following components:   Lactic Acid, Venous 3.5 (*)    All other components within normal limits  LACTIC ACID, PLASMA - Abnormal; Notable for the following components:   Lactic Acid, Venous 3.0 (*)    All other components within normal limits  OSMOLALITY - Abnormal; Notable for the following components:   Osmolality 320 (*)    All other components within normal limits  CBG MONITORING, ED - Abnormal; Notable for the following components:   Glucose-Capillary >600 (*)    All other components within normal limits  CBG MONITORING, ED - Abnormal; Notable for the following components:   Glucose-Capillary >600 (*)    All other components within normal limits  I-STAT CHEM 8, ED - Abnormal; Notable for the following components:   Sodium 121 (*)    Chloride 87 (*)    BUN 69 (*)    Creatinine, Ser 1.40 (*)    Glucose, Bld >700 (*)    Calcium, Ion 1.06 (*)    All other components within normal limits  I-STAT VENOUS BLOOD GAS, ED - Abnormal; Notable for the following components:   pCO2, Ven 42.9 (*)    Sodium 121 (*)    Calcium, Ion 1.04 (*)    All other components within normal limits  URINALYSIS, ROUTINE W REFLEX MICROSCOPIC  BLOOD GAS, VENOUS  I-STAT BETA HCG BLOOD, ED ([facility], [facility], [facility] ONLY)  Radiology DG Foot Complete Right  Result Date: 12/18/2021 CLINICAL DATA:  Right foot wound EXAM: RIGHT FOOT COMPLETE - 3+ VIEW COMPARISON:  None. FINDINGS: Transmetatarsal amputation has been performed. No acute fracture or dislocation. Moderate plantar calcaneal spur. There is diffuse soft tissue swelling of the right forefoot. Small  soft tissue ulcer noted distal to the residual first metatarsal. No subjacent osseous erosion to suggest osteomyelitis. Advanced vascular calcifications are noted. Subcutaneous calcifications within the right lower extremity may reflect changes related to venous stasis. IMPRESSION: Peripheral soft tissue swelling and stump ulceration. No definite evidence of osteomyelitis. Electronically Signed   By: Fidela Salisbury M.D.   On: 12/18/2021 23:48    Pertinent labs & imaging results that were available during my care of the patient were reviewed by me and considered in my medical decision making (see MDM for details).  Medications Ordered in ED Medications  insulin regular, human (MYXREDLIN) 100 units/ 100 mL infusion (has no administration in time range)  lactated ringers infusion (has no administration in time range)  dextrose 5 % in lactated ringers infusion (has no administration in time range)  dextrose 50 % solution 0-50 mL (has no administration in time range)  potassium chloride 10 mEq in 100 mL IVPB (10 mEq Intravenous New Bag/Given 12/19/21 0820)  prednisoLONE acetate (PRED FORTE) 1 % ophthalmic suspension 1 drop (has no administration in time range)  atropine 1 % ophthalmic solution 1 drop (has no administration in time range)  vancomycin (VANCOCIN) 2,500 mg in sodium chloride 0.9 % 500 mL IVPB (has no administration in time range)  lactated ringers bolus 1,000 mL (1,000 mLs Intravenous New Bag/Given 12/19/21 0822)  fentaNYL (SUBLIMAZE) injection 50 mcg (50 mcg Intravenous Given 12/19/21 0814)  ondansetron (ZOFRAN) 4 MG/2ML injection (4 mg  Given 12/19/21 0814)               Clinical Course as of 12/26/21 1516  Sat Dec 19, 2021  0809 Predforte q2, atropine daily, irits of right [MK]    Clinical Course User Index [MK] Lianny Molter, Debe Coder, MD                                                                                                                          Procedures .Critical  Care Performed by: Teressa Lower, MD Authorized by: Teressa Lower, MD   Critical care provider statement:    Critical care time (minutes):  30   Critical care was necessary to treat or prevent imminent or life-threatening deterioration of the following conditions:  Endocrine crisis   Critical care was time spent personally by me on the following activities:  Development of treatment plan with patient or surrogate, discussions with consultants, evaluation of patient's response to treatment, examination of patient, ordering and review of laboratory studies, ordering and review of radiographic studies, ordering and performing treatments and interventions, pulse oximetry, re-evaluation of patient's condition and review of old charts  (including critical care time)  Medical Decision Making /  ED Course   This patient presents to the ED for concern of foot pain, eye pain, nausea, this involves an extensive number of treatment options, and is a complaint that carries with it a high risk of complications and morbidity.  The differential diagnosis includes osteomyelitis, cellulitis, iritis, HHS, DKA, stress hyperglycemia  MDM: Patient seen emergency department for evaluation of multiple complaints as described above.  Physical exam reveals a an area of ulceration at the base of the first metatarsal on the right foot.  Right-sided conjunctival injection noted as well.  Laboratory evaluation obtained 8 hours prior while in the waiting room with a leukocytosis to 10.9, likely pseudohyponatremia at 121 which corrects to a sodium of 140 with a glucose of 904, BUN 53, creatinine 1.76, initial lactate elevated at 3.5 and repeat lactate 3.0.  Repeat blood gases showing a current pH of 7.4 and a glucose greater than 700.  Patient started on insulin drip for suspected HHS versus stress hyperglycemia.  Low suspicion for DKA with no acidosis.  Foot x-ray with no obvious evidence of osteomyelitis, and thus follow-up  MRI is ordered.  2 liter lactated Ringer's as ordered and patient started on maintenance fluids.  Ophthalmology was consulted who assisted me in finding the outside records for the patient's ophthalmology visit yesterday and they recommended restarting the patient's Pred forte 1 drop every 2 and atropine 1 drop daily.  Patient will require admission for likely HHS versus stress hyperglycemia in the setting of a possible osteomyelitis.  Patient does have a history of pain seeking behavior and thus we will be cautious with opioids.  She was given initial small dose of fentanyl and a single Norco here in the emergency department, and we will avoid IV Dilaudid.   Additional history obtained: -Additional history obtained from family member -External records from outside source obtained and reviewed including: Chart review including previous notes, labs, imaging, consultation notes   Lab Tests: -I ordered, reviewed, and interpreted labs.   The pertinent results include:   Labs Reviewed  CBC WITH DIFFERENTIAL/PLATELET - Abnormal; Notable for the following components:      Result Value   WBC 10.9 (*)    Hemoglobin 11.9 (*)    Abs Immature Granulocytes 0.09 (*)    All other components within normal limits  COMPREHENSIVE METABOLIC PANEL - Abnormal; Notable for the following components:   Sodium 121 (*)    Chloride 81 (*)    Glucose, Bld 904 (*)    BUN 53 (*)    Creatinine, Ser 1.76 (*)    Calcium 8.7 (*)    Albumin 2.8 (*)    Alkaline Phosphatase 200 (*)    GFR, Estimated 36 (*)    All other components within normal limits  LACTIC ACID, PLASMA - Abnormal; Notable for the following components:   Lactic Acid, Venous 3.5 (*)    All other components within normal limits  LACTIC ACID, PLASMA - Abnormal; Notable for the following components:   Lactic Acid, Venous 3.0 (*)    All other components within normal limits  OSMOLALITY - Abnormal; Notable for the following components:   Osmolality 320 (*)     All other components within normal limits  CBG MONITORING, ED - Abnormal; Notable for the following components:   Glucose-Capillary >600 (*)    All other components within normal limits  CBG MONITORING, ED - Abnormal; Notable for the following components:   Glucose-Capillary >600 (*)    All other components within normal limits  I-STAT CHEM 8, ED - Abnormal; Notable for the following components:   Sodium 121 (*)    Chloride 87 (*)    BUN 69 (*)    Creatinine, Ser 1.40 (*)    Glucose, Bld >700 (*)    Calcium, Ion 1.06 (*)    All other components within normal limits  I-STAT VENOUS BLOOD GAS, ED - Abnormal; Notable for the following components:   pCO2, Ven 42.9 (*)    Sodium 121 (*)    Calcium, Ion 1.04 (*)    All other components within normal limits  URINALYSIS, ROUTINE W REFLEX MICROSCOPIC  BLOOD GAS, VENOUS  I-STAT BETA HCG BLOOD, ED ([facility], [facility], [facility] ONLY)     Imaging Studies ordered: I ordered imaging studies including x-ray of the foot, MRI of the foot I independently visualized and interpreted imaging. I agree with the radiologist interpretation   Medicines ordered and prescription drug management: Meds ordered this encounter  Medications   lactated ringers bolus 1,000 mL   DISCONTD: insulin aspart (novoLOG) injection 20 Units   insulin regular, human (MYXREDLIN) 100 units/ 100 mL infusion    Order Specific Question:   EndoTool low target:    Answer:   140    Order Specific Question:   EndoTool high target:    Answer:   180    Order Specific Question:   Type of Diabetes    Answer:   Type 2    Order Specific Question:   Mode of Therapy    Answer:   [id number] for HHS    Order Specific Question:   Start Method    Answer:   EndoTool to calculate   lactated ringers infusion   dextrose 5 % in lactated ringers infusion   dextrose 50 % solution 0-50 mL   potassium chloride 10 mEq in 100 mL IVPB   fentaNYL (SUBLIMAZE) injection 50 mcg   ondansetron (ZOFRAN) 4 MG/2ML  injection    Meeks, Elizabeth A: cabinet override   prednisoLONE acetate (PRED FORTE) 1 % ophthalmic suspension 1 drop   atropine 1 % ophthalmic solution 1 drop   vancomycin (VANCOCIN) 2,500 mg in sodium chloride 0.9 % 500 mL IVPB    Order Specific Question:   Indication:    Answer:   Osteomyelitis    -I have reviewed the patients home medicines and have made adjustments as needed  Critical interventions Fluid resuscitation, insulin drip   Cardiac Monitoring: The patient was maintained on a cardiac monitor.  I personally viewed and interpreted the cardiac monitored which showed an underlying rhythm of: Normal sinus rhythm  Social Determinants of Health:  Factors impacting patients care include: Decreased mobility, medical complexity   Reevaluation: After the interventions noted above, I reevaluated the patient and found that they have :improved  Co morbidities that complicate the patient evaluation  Past Medical History:  Diagnosis Date   Acute osteomyelitis of ankle or foot, left ([place]) 01/31/2018   Alveolar hypoventilation    Anemia    not on iron pill   Asthma    Bipolar 2 disorder ([name])    Carpal tunnel syndrome on right    recurrent   Cellulitis 08/2010-08/2011   Chronic pain    COPD (chronic obstructive pulmonary disease) ([place])    Symbicort daily and Proventil as needed   Costochondritis    Diabetes mellitus type II, uncontrolled 2000   Type 2, Uncontrolled.Takes Lantus daily.Fasting blood sugar runs 150   Drug-seeking behavior    GERD (gastroesophageal reflux  disease)    takes Pantoprazole and Zantac daily   HLD (hyperlipidemia)    takes Atorvastatin daily   Hypertension    takes Lisinopril and Coreg daily   Morbid obesity ([name])    Nocturia    OSA on CPAP    Peripheral neuropathy    takes Gabapentin daily   Pneumonia    "walking" several yrs ago and as a baby (12/05/2018)   Rectal fissure    Restless leg    SVT (supraventricular tachycardia) ([place])     Syncope 02/25/2016   Urinary frequency    Urinary incontinence 10/23/2020   Varicose veins    Right medial thigh and Left leg       Dispostion: Admit     Final Clinical Impression(s) / ED Diagnoses Final diagnoses:  Hyperglycemia  Iritis     @PCDICTATION @    Teressa Lower, MD 12/19/21 Rolfe, Loop, MD 12/19/21 Dodge, Bud, MD 12/26/21 1516

## 2021-12-19 NOTE — ED Notes (Addendum)
Patient placed in room for first time from MRI, care of patient received by this RN. Received patient with Insulin gtt was disconnected and turned off as well as IV fluids disconnected and off. Patient is A/Ox4. Denies pain/discomfort. Insulin gtt restarted at previous rate, fluid bolus restarted.

## 2021-12-19 NOTE — ED Notes (Signed)
Sitting on side of bed , talking to daughter on the phone.

## 2021-12-19 NOTE — Progress Notes (Signed)
Pt. Refusing to be placed on bipap and wont wear V 60 using hospital cpap with home mask that's the only way she will wear it .

## 2021-12-19 NOTE — Care Management (Signed)
The Transition of Care Department [facility]) has reviewed patient and no TOC needs have been identified at this time. We will continue to monitor patient advancement through interdisciplinary progression rounds. If new patient transition needs arise, please place a TOC consult

## 2021-12-19 NOTE — ED Notes (Addendum)
Pts O2 saturation at 82% while sleeping on her side and is now at 99%. Pt placed on 2L [place] per RN, Kathlee Nations.

## 2021-12-19 NOTE — Progress Notes (Signed)
Had conversation with patient during admission nutritional assessment.  When asked if patient followed a diabetic diet at home, she stated, "I eat whatever I want".  Pt clearly non-compliant with diabetic regimen.  ? If she comprehends the consequences of her actions and if a diabetic coordinator may have better luck speaking with her.

## 2021-12-19 NOTE — Consult Note (Signed)
Orthopaedic Trauma Service (OTS) Consult   Patient ID: [name] MRN: [id number] DOB/AGE: [birth year] 46 y.o.  Reason for Consult: Right foot wound Referring Physician: Dr. Leane Platt, MD [facility])  HPI: [name] is an 46 y.o. female with a history of type II insulin-dependent diabetes, right-sided osteomyelitis status post partial amputation of the foot, left-sided osteomyelitis status post BKA being seen in consultation at the request of Dr. Tinnie Gens for worsening right foot pain starting about 1 week ago.  Yesterday her right foot started hurting significantly worse than usual and she noticed an ulceration at the distal tip.  She arrived to [facility] emergency department earlier today with a significantly elevated glucose and associated nausea.  Patient admitted to hospitalist service for glucose control and IV antibiotics.  Orthopedics was consulted for evaluation and management of suspected cellulitis to the right foot.  Patient seen this evening on 5N.  She is currently resting comfortably.  Notes pain in the right foot currently, medications helping some.  Has noted some yellowish fluid draining from the ulceration on her foot over the past week.  Denies any fevers or chills.  Had some nausea at time of arrival to ED, notes this has resolved.  Notes that she is supposed to take her blood sugars before every meal and at bedtime but admits to only doing so a couple of times a day.  Blood sugars normally run 150-200, but occasionally will get up to the 400-500. Wears prosthesis on left lower extremity.  Past Medical History:  Diagnosis Date   Acute osteomyelitis of ankle or foot, left ([place]) 01/31/2018   Alveolar hypoventilation    Anemia    not on iron pill   Asthma    Bipolar 2 disorder ([name])    Carpal tunnel syndrome on right    recurrent   Cellulitis 08/2010-08/2011   Chronic pain    COPD (chronic obstructive pulmonary disease) ([name])    Symbicort daily and Proventil as  needed   Costochondritis    Depression    Diabetes mellitus type II, uncontrolled 2000   Type 2, Uncontrolled.Takes Lantus daily.Fasting blood sugar runs 150   Drug-seeking behavior    GERD (gastroesophageal reflux disease)    takes Pantoprazole and Zantac daily   HLD (hyperlipidemia)    takes Atorvastatin daily   Hypertension    takes Lisinopril and Coreg daily   Morbid obesity ([name])    Nocturia    OSA on CPAP    Peripheral neuropathy    takes Gabapentin daily   Pneumonia    "walking" several yrs ago and as a baby (12/05/2018)   Rectal fissure    Restless leg    SVT (supraventricular tachycardia) ([name])    Syncope 02/25/2016   Urinary frequency    Urinary incontinence 10/23/2020   Varicose veins    Right medial thigh and Left leg     Past Surgical History:  Procedure Laterality Date   AMPUTATION Left 02/01/2018   Procedure: LEFT FOURTH AND 5TH TOE RAY AMPUTATION;  Surgeon: [name], MD;  Location: [place];  Service: Orthopedics;  Laterality: Left;   AMPUTATION Left 03/03/2018   Procedure: LEFT BELOW KNEE AMPUTATION;  Surgeon: [name], MD;  Location: [place];  Service: Orthopedics;  Laterality: Left;   CARPAL TUNNEL RELEASE Bilateral    CESAREAN SECTION  2007   CORONARY ANGIOGRAPHY N/A 11/26/2020   Procedure: CORONARY ANGIOGRAPHY;  Surgeon: Sherren Mocha, MD;  Location: [place] [facility];  Service:  Cardiovascular;  Laterality: N/A;   INCISION AND DRAINAGE ABSCESS Left 10/16/2021   Procedure: INCISION AND DRAINAGE ABSCESS;  Surgeon: Dwan Bolt, MD;  Location: [place];  Service: General;  Laterality: Left;   INCISION AND DRAINAGE PERIRECTAL ABSCESS Left 05/18/2019   Procedure: IRRIGATION AND DEBRIDEMENT OF PANNIS ABSCESS, POSSIBLE DEBRIDEMENT OF BUTTOCK WOUND;  Surgeon: Donnie Mesa, MD;  Location: [place];  Service: General;  Laterality: Left;   IRRIGATION AND DEBRIDEMENT BUTTOCKS Left 05/17/2019   Procedure: IRRIGATION AND DEBRIDEMENT BUTTOCKS;  Surgeon:  Donnie Mesa, MD;  Location: [place];  Service: General;  Laterality: Left;   KNEE ARTHROSCOPY Right 07/17/2010   LEFT HEART CATHETERIZATION WITH CORONARY ANGIOGRAM N/A 07/27/2012   Procedure: LEFT HEART CATHETERIZATION WITH CORONARY ANGIOGRAM;  Surgeon: Sherren Mocha, MD;  Location: [facility] C[facility];  Service: Cardiovascular;  Laterality: N/A;   MASS EXCISION N/A 06/29/2013   Procedure:  WIDE LOCAL EXCISION OF POSTERIOR NECK ABSCESS;  Surgeon: Ralene Ok, MD;  Location: [place];  Service: General;  Laterality: N/A;   REPAIR KNEE LIGAMENT Left    "fixed ligaments and chipped patella"   right transmetatarsal amputation       Family History  Problem Relation Age of Onset   Diabetes Mother    Hyperlipidemia Mother    Depression Mother    GER disease Mother    Allergic rhinitis Mother    Restless legs syndrome Mother    Heart attack Paternal Uncle    Heart disease Paternal Grandmother    Heart attack Paternal Grandmother    Heart attack Paternal Grandfather    Heart disease Paternal Grandfather    Heart attack Father    Migraines Sister    Cancer Maternal Grandmother        COLON   Hypertension Maternal Grandmother    Hyperlipidemia Maternal Grandmother    Diabetes Maternal Grandmother    Other Maternal Grandfather        GUN SHOT   Anxiety disorder Sister    Asthma Child     Social History:  reports that she quit smoking about 16 years ago. Her smoking use included cigarettes. She has a 3.75 pack-year smoking history. She has never used smokeless tobacco. She reports that she does not currently use drugs. She reports that she does not drink alcohol.  Allergies:  Allergies  Allergen Reactions   Cefepime Other (See Comments)    AKI, see records from [place] hospitalization in January 2020.   Other reaction(s): Other (See Comments)  Note pt has tolerated Rocephin and Keflex   Kiwi Extract Shortness Of Breath, Swelling and Anaphylaxis   Morphine And Related Nausea And Vomiting    Nalbuphine     Other reaction(s): Hallucinations, Other (See Comments) "FEELS LIKE SOMETHING CRAWLING ON ME" Reaction:  Nervousness "FEELS LIKE SOMETHING CRAWLING ON ME" Reaction:  Nervousness Reaction:  Nervousness "FEELS LIKE SOMETHING CRAWLING ON ME"    Trental [Pentoxifylline] Nausea And Vomiting   Toradol [Ketorolac Tromethamine] Other (See Comments)    Feels like something is crawling on me   Morphine Nausea And Vomiting   Nubain [Nalbuphine Hcl] Other (See Comments)    "FEELS LIKE SOMETHING CRAWLING ON ME"    Medications: I have reviewed the patient's current medications. Prior to Admission:  Medications Prior to Admission  Medication Sig Dispense Refill Last Dose   albuterol (VENTOLIN HFA) 108 (90 Base) MCG/ACT inhaler INHALE 1 TO 2 PUFFS BY MOUTH EVERY SIX HOURS AS NEEDED FOR WHEEZING OR SHORTNESS OF BREATH (Patient taking  differently: Inhale 1 puff into the lungs every 6 (six) hours as needed for shortness of breath.) 6.7 g 4 unk   gabapentin (NEURONTIN) 600 MG tablet Take 1 tablet (600 mg total) by mouth at bedtime. (Patient taking differently: Take 1,200 mg by mouth 3 (three) times daily.) 180 tablet 4 12/18/2021   Glucagon (BAQSIMI ONE PACK) 3 MG/DOSE POWD Place 3 mg into the nose as needed (low blood sugar).   unk   Glucagon, rDNA, (GLUCAGON EMERGENCY) 1 MG KIT Inject 1 mg into the muscle as needed for low blood sugar.   unk   insulin aspart (NOVOLOG FLEXPEN) 100 UNIT/ML FlexPen Inject 20 Units into the skin 3 (three) times daily with meals. (Patient taking differently: Inject 60 Units into the skin 3 (three) times daily with meals.) 45 mL 3 12/18/2021   insulin degludec (TRESIBA FLEXTOUCH) 200 UNIT/ML FlexTouch Pen Inject 46 Units into the skin 2 (two) times daily. (Patient taking differently: Inject 70 Units into the skin 2 (two) times daily.) 30 mL 3 12/18/2021   [START ON 01/02/2022] oxyCODONE-acetaminophen (PERCOCET) 7.5-325 MG tablet Take 1 tablet by mouth every 8 (eight)  hours as needed for severe pain. 90 tablet 0 12/18/2021   ramelteon (ROZEREM) 8 MG tablet TAKE 1 TABLET BY MOUTH AT BEDTIME (Patient taking differently: Take 8 mg by mouth at bedtime.) 30 tablet 1 Past Week   SYMBICORT 160-4.5 MCG/ACT inhaler INHALE 2 PUFFS INTO THE LUNGS 2 TIMES DAILY (Patient taking differently: Inhale 2 puffs into the lungs 2 (two) times daily.) 10.2 g 4 12/18/2021   terconazole (TERAZOL 7) 0.4 % vaginal cream Place 1 applicator vaginally at bedtime. 45 g 0 Past Week   torsemide (DEMADEX) 20 MG tablet TAKE 4 TABLETS BY MOUTH ONCE A DAY (Patient taking differently: Take 80 mg by mouth daily.) 120 tablet 2 7/59/1638   TRULICITY 1.5 GY/6.5LD SOPN inject 1.34m into THE SKIN ONCE A WEEK (Patient taking differently: Inject 1.75 mg as directed every Sunday.) 2 mL 5 Past Week   Accu-Chek FastClix Lancets MISC USE DAILY AS INSTRUCTED 102 each 3    ACCU-CHEK GUIDE test strip USE T0 CHECK BLOOD SUGAR 3 TIMES DAILY 100 strip 5    Accu-Chek Softclix Lancets lancets Use to check blood glucose four times daily 100 each 12    aspirin EC 81 MG tablet Take 1 tablet (81 mg total) by mouth daily. Swallow whole. (Patient not taking: Reported on 12/19/2021) 90 tablet 3 Not Taking   atorvastatin (LIPITOR) 80 MG tablet Take 1 tablet (80 mg total) by mouth daily. (Patient not taking: Reported on 12/19/2021) 90 tablet 3 Not Taking   Cholecalciferol 1000 units tablet Take 1 tablet (1,000 Units total) by mouth daily. (Patient not taking: Reported on 12/19/2021) 90 tablet 0 Not Taking   Continuous Blood Gluc Sensor (DEXCOM G6 SENSOR) MISC Inject 1 applicator into the skin as directed. Change sensor every 10 days. 3 each 11    Continuous Blood Gluc Transmit (DEXCOM G6 TRANSMITTER) MISC Inject 1 Device into the skin as directed. Reuse 8 times with sensor changes. 1 each 3    Ferrous Sulfate (IRON) 325 (65 Fe) MG TABS Take 1 tablet (325 mg total) by mouth every other day. (Patient not taking: Reported on 12/19/2021) 45  tablet 0 Not Taking   GNP ULTICARE PEN NEEDLES 32G X 4 MM MISC USE TO inject insulin 2 TIMES DAILY 100 each 2    Misc. Devices (TRANSFER BENCH) MISC Use daily 1 each 0  omeprazole (PRILOSEC) 40 MG capsule Take 1 capsule (40 mg total) by mouth daily. (Patient not taking: Reported on 12/19/2021) 30 capsule 3 Not Taking   ondansetron (ZOFRAN-ODT) 4 MG disintegrating tablet Dissolve 1 tablet under the tongue every 8 hours as needed for nausea. (Patient not taking: Reported on 12/19/2021) 10 tablet 0 Not Taking   potassium chloride SA (KLOR-CON M20) 20 MEQ tablet Take 1 tablet (20 mEq total) by mouth daily. (Patient not taking: Reported on 12/19/2021) 30 tablet 6 Not Taking   rOPINIRole (REQUIP) 1 MG tablet Take 1 tablet (1 mg total) by mouth at bedtime. (Patient not taking: Reported on 12/19/2021) 90 tablet 0 Not Taking   Tiotropium Bromide Monohydrate (SPIRIVA RESPIMAT) 1.25 MCG/ACT AERS Inhale 2 puffs into the lungs daily. (Patient not taking: Reported on 12/19/2021) 1 each 0 Not Taking    ROS: Constitutional: No fever or chills Vision: No changes in vision ENT: No difficulty swallowing CV: No chest pain Pulm: No SOB or wheezing GI: No nausea or vomiting GU: No urgency or inability to hold urine Skin: + Wound right foot Neurologic: No numbness or tingling Psychiatric: No depression or anxiety Heme: No bruising Allergic: No reaction to medications or food   Exam: Blood pressure (!) 140/102, pulse 90, temperature 97.9 F (36.6 C), temperature source Oral, resp. rate 14, height '5\' 2"'  (1.575 m), weight (!) 155.6 kg, SpO2 98 %. General: Resting comfortably, no acute distress Orientation: Alert and oriented x3 Mood and Affect: Slightly disengaged Gait: Not assessed Coordination and balance: Within normal limits  Right lower extremity: Previous TMA.  Ulceration noted over residual first metatarsal space.  Surrounding erythema and desquamation of surrounding skin.  I am able to express a small  amount of thin white/yellow fluid from wound.  Endorses sensation to light touch over the dorsal and plantar aspect of foot.  Skin warm and dry  Left lower extremity: Previous BKA. No significant tenderness to palpation.  Endorses sensation to light touch over thigh.  Remainder of exam limited secondary to poor patient participation.  Medical Decision Making: Data: Imaging: MRI right foot shows cellulitis and some edematous changes in the residual first metatarsal with possibility of early osteomyelitis.   Labs:  Results for orders placed or performed during the hospital encounter of 12/18/21 (from the past 24 hour(s))  CBC with Differential     Status: Abnormal   Collection Time: 12/18/21 11:28 PM  Result Value Ref Range   WBC 10.9 (H) 4.0 - 10.5 K/uL   RBC 3.90 3.87 - 5.11 MIL/uL   Hemoglobin 11.9 (L) 12.0 - 15.0 g/dL   HCT 37.6 36.0 - 46.0 %   MCV 96.4 80.0 - 100.0 fL   MCH 30.5 26.0 - 34.0 pg   MCHC 31.6 30.0 - 36.0 g/dL   RDW 13.6 11.5 - 15.5 %   Platelets 353 150 - 400 K/uL   nRBC 0.0 0.0 - 0.2 %   Neutrophils Relative % 56 %   Neutro Abs 6.2 1.7 - 7.7 K/uL   Lymphocytes Relative 35 %   Lymphs Abs 3.8 0.7 - 4.0 K/uL   Monocytes Relative 6 %   Monocytes Absolute 0.7 0.1 - 1.0 K/uL   Eosinophils Relative 1 %   Eosinophils Absolute 0.2 0.0 - 0.5 K/uL   Basophils Relative 1 %   Basophils Absolute 0.1 0.0 - 0.1 K/uL   Immature Granulocytes 1 %   Abs Immature Granulocytes 0.09 (H) 0.00 - 0.07 K/uL  Comprehensive metabolic panel  Status: Abnormal   Collection Time: 12/18/21 11:28 PM  Result Value Ref Range   Sodium 121 (L) 135 - 145 mmol/L   Potassium 4.2 3.5 - 5.1 mmol/L   Chloride 81 (L) 98 - 111 mmol/L   CO2 25 22 - 32 mmol/L   Glucose, Bld 904 (HH) 70 - 99 mg/dL   BUN 53 (H) 6 - 20 mg/dL   Creatinine, Ser 1.76 (H) 0.44 - 1.00 mg/dL   Calcium 8.7 (L) 8.9 - 10.3 mg/dL   Total Protein 7.9 6.5 - 8.1 g/dL   Albumin 2.8 (L) 3.5 - 5.0 g/dL   AST 16 15 - 41 U/L   ALT 18  0 - 44 U/L   Alkaline Phosphatase 200 (H) 38 - 126 U/L   Total Bilirubin 0.3 0.3 - 1.2 mg/dL   GFR, Estimated 36 (L) >60 mL/min   Anion gap 15 5 - 15  Lactic acid, plasma     Status: Abnormal   Collection Time: 12/18/21 11:28 PM  Result Value Ref Range   Lactic Acid, Venous 3.5 (HH) 0.5 - 1.9 mmol/L  POC CBG, ED     Status: Abnormal   Collection Time: 12/19/21  5:42 AM  Result Value Ref Range   Glucose-Capillary >600 (HH) 70 - 99 mg/dL   Comment 1 Notify RN    Comment 2 Document in Chart   Osmolality     Status: Abnormal   Collection Time: 12/19/21  7:51 AM  Result Value Ref Range   Osmolality 320 (H) 275 - 295 mOsm/kg  Lactic acid, plasma     Status: Abnormal   Collection Time: 12/19/21  8:00 AM  Result Value Ref Range   Lactic Acid, Venous 3.0 (HH) 0.5 - 1.9 mmol/L  I-Stat beta hCG blood, ED     Status: None   Collection Time: 12/19/21  8:07 AM  Result Value Ref Range   I-stat hCG, quantitative <5.0 <5 mIU/mL   Comment 3          I-Stat venous blood gas, ED     Status: Abnormal   Collection Time: 12/19/21  8:10 AM  Result Value Ref Range   pH, Ven 7.411 7.250 - 7.430   pCO2, Ven 42.9 (L) 44.0 - 60.0 mmHg   pO2, Ven 37.0 32.0 - 45.0 mmHg   Bicarbonate 27.2 20.0 - 28.0 mmol/L   TCO2 29 22 - 32 mmol/L   O2 Saturation 71.0 %   Acid-Base Excess 2.0 0.0 - 2.0 mmol/L   Sodium 121 (L) 135 - 145 mmol/L   Potassium 4.3 3.5 - 5.1 mmol/L   Calcium, Ion 1.04 (L) 1.15 - 1.40 mmol/L   HCT 43.0 36.0 - 46.0 %   Hemoglobin 14.6 12.0 - 15.0 g/dL   Sample type VENOUS    Comment NOTIFIED PHYSICIAN   I-stat chem 8, ED (not at [facility] or [facility])     Status: Abnormal   Collection Time: 12/19/21  8:11 AM  Result Value Ref Range   Sodium 121 (L) 135 - 145 mmol/L   Potassium 4.4 3.5 - 5.1 mmol/L   Chloride 87 (L) 98 - 111 mmol/L   BUN 69 (H) 6 - 20 mg/dL   Creatinine, Ser 1.40 (H) 0.44 - 1.00 mg/dL   Glucose, Bld >700 (HH) 70 - 99 mg/dL   Calcium, Ion 1.06 (L) 1.15 - 1.40 mmol/L   TCO2 26 22  - 32 mmol/L   Hemoglobin 14.6 12.0 - 15.0 g/dL   HCT 43.0 36.0 -  46.0 %   Comment NOTIFIED PHYSICIAN   CBG monitoring, ED     Status: Abnormal   Collection Time: 12/19/21  8:37 AM  Result Value Ref Range   Glucose-Capillary >600 (HH) 70 - 99 mg/dL  Urinalysis, Routine w reflex microscopic Urine, Clean Catch     Status: Abnormal   Collection Time: 12/19/21  9:32 AM  Result Value Ref Range   Color, Urine YELLOW YELLOW   APPearance CLEAR CLEAR   Specific Gravity, Urine 1.010 1.005 - 1.030   pH 5.0 5.0 - 8.0   Glucose, UA >=500 (A) NEGATIVE mg/dL   Hgb urine dipstick TRACE (A) NEGATIVE   Bilirubin Urine NEGATIVE NEGATIVE   Ketones, ur NEGATIVE NEGATIVE mg/dL   Protein, ur NEGATIVE NEGATIVE mg/dL   Nitrite NEGATIVE NEGATIVE   Leukocytes,Ua TRACE (A) NEGATIVE  Urinalysis, Microscopic (reflex)     Status: Abnormal   Collection Time: 12/19/21  9:32 AM  Result Value Ref Range   RBC / HPF 0-5 0 - 5 RBC/hpf   WBC, UA 21-50 0 - 5 WBC/hpf   Bacteria, UA FEW (A) NONE SEEN   Squamous Epithelial / LPF 0-5 0 - 5   Budding Yeast PRESENT   CBG monitoring, ED     Status: Abnormal   Collection Time: 12/19/21 10:49 AM  Result Value Ref Range   Glucose-Capillary >600 (HH) 70 - 99 mg/dL  CBG monitoring, ED     Status: Abnormal   Collection Time: 12/19/21 11:42 AM  Result Value Ref Range   Glucose-Capillary 586 (HH) 70 - 99 mg/dL   Comment 1 Notify RN   CBG monitoring, ED     Status: Abnormal   Collection Time: 12/19/21  1:41 PM  Result Value Ref Range   Glucose-Capillary 549 (HH) 70 - 99 mg/dL  CBG monitoring, ED     Status: Abnormal   Collection Time: 12/19/21  2:40 PM  Result Value Ref Range   Glucose-Capillary 466 (H) 70 - 99 mg/dL  CBG monitoring, ED     Status: Abnormal   Collection Time: 12/19/21  3:15 PM  Result Value Ref Range   Glucose-Capillary 394 (H) 70 - 99 mg/dL  CBG monitoring, ED     Status: Abnormal   Collection Time: 12/19/21  4:23 PM  Result Value Ref Range    Glucose-Capillary 393 (H) 70 - 99 mg/dL  Glucose, capillary     Status: Abnormal   Collection Time: 12/19/21  5:53 PM  Result Value Ref Range   Glucose-Capillary 302 (H) 70 - 99 mg/dL  Blood gas, venous (at [facility] and [facility], not at [facility])     Status: Abnormal   Collection Time: 12/19/21  6:24 PM  Result Value Ref Range   FIO2 21.00    pH, Ven 7.325 7.250 - 7.430   pCO2, Ven 52.7 44.0 - 60.0 mmHg   pO2, Ven <32.0 (LL) 32.0 - 45.0 mmHg   Bicarbonate 26.7 20.0 - 28.0 mmol/L   Acid-Base Excess 1.3 0.0 - 2.0 mmol/L   O2 Saturation 29.3 %   Patient temperature 37.0    Drawn by 164    Sample type VENOUS    *Note: Due to a large number of results and/or encounters for the requested time period, some results have not been displayed. A complete set of results can be found in Results Review.      Assessment/Plan: 46 year old female with previous transmetatarsal amputation right side and BKA left lower extremity presenting with chronic wound and worsening pain/redness  of right foot  Based on appearance of MRI, I do not feel that surgical debridement will be required during this hospitalization. Would recommend continuing with broad-spectrum antibiotics and tight blood sugar control.  Plan has been discussed with patient at bedside, she is in agreement with plan and is relieved no surgical intervention is needed at this time.  We will continue to follow while in hospital and plan to re-evaluate wound on Monday for improvement but again do not plan for any surgical intervention at this time.   Gwinda Passe PA-C [facility] [phone] (office) [email or web address]

## 2021-12-19 NOTE — Evaluation (Signed)
Occupational Therapy Evaluation Patient Details Name: [name] MRN: [id number] DOB: 04/21/1976 Today's Date: 12/19/2021   History of Present Illness 46 y.o. female who presented with nonhealing ulcer on her right transmetatarsal amputation site.  She also reports severe headache and has not been able to see out of her eye, blurred vision after cataract surgery.  PMH: type 2 diabetes, morbid obesity, obesity hypoventilation syndrome, OSA wears BiPAP at night, hypertension, asthma, GERD, hyperlipidemia, restless leg syndrome, left BKA, R transmetatarsal amputation, mood disorder, hypertension, history of chest pain.   Clinical Impression   Patient admitted for the diagnosis above.  Currently blood sugars > 400 with severe headache.  At home she lives with her daughters' and has a PCA M-F for 8 hours/day.  Patient has to walk short distances because her w/c does not fit through any door openings, but generally uses w/c for distances over 20 feet.  Her daughters assist with meals, and her PCA assists with ADL/IADL, mobility, hygiene and community mobility.  Deficits impacting independence are listed below.  Currently she is bedlevel for ADL, and transfers deferred due to wound to RLE and c/o headache.        Recommendations for follow up therapy are one component of a multi-disciplinary discharge planning process, led by the attending physician.  Recommendations may be updated based on patient status, additional functional criteria and insurance authorization.   Follow Up Recommendations  No OT follow up    Assistance Recommended at Discharge Frequent or constant Supervision/Assistance  Patient can return home with the following      Functional Status Assessment  Patient has had a recent decline in their functional status and demonstrates the ability to make significant improvements in function in a reasonable and predictable amount of time.  Equipment Recommendations  None recommended by OT     Recommendations for Other Services       Precautions / Restrictions Precautions Precautions: Fall Precaution Comments: order has progressive mobility with no restriction, but ? WBS to RLE Required Braces or Orthoses: Other Brace Other Brace: L prosthetic Restrictions Weight Bearing Restrictions: No      Mobility Bed Mobility Overal bed mobility: Needs Assistance Bed Mobility: Rolling;Supine to Sit;Sit to Supine Rolling: Supervision   Supine to sit: Supervision Sit to supine: Supervision     Patient Response: Anxious  Transfers                   General transfer comment: deferred      Balance Overall balance assessment: Needs assistance Sitting-balance support: Feet supported;No upper extremity supported Sitting balance-Leahy Scale: Fair                                     ADL either performed or assessed with clinical judgement   ADL       Grooming: Wash/dry hands;Wash/dry face;Set up;Bed level       Lower Body Bathing: Maximal assistance;Bed level       Lower Body Dressing: Maximal assistance;Bed level     Toilet Transfer Details (indicate cue type and reason): Deferred due to HA and RLE wound                 Vision Patient Visual Report: Blurring of vision Vision Assessment?: Vision impaired- to be further tested in functional context     Perception Perception Perception: Not tested   Praxis Praxis Praxis: Not tested  Pertinent Vitals/Pain Pain Assessment: Faces Faces Pain Scale: Hurts even more Pain Location: headache Pain Descriptors / Indicators: Aching Pain Intervention(s): Monitored during session     Hand Dominance Right   Extremity/Trunk Assessment Upper Extremity Assessment Upper Extremity Assessment: Overall WFL for tasks assessed   Lower Extremity Assessment Lower Extremity Assessment: Defer to PT evaluation   Cervical / Trunk Assessment Cervical / Trunk Assessment: Normal;Other  exceptions Cervical / Trunk Exceptions: body habitus   Communication Communication Communication: No difficulties   Cognition Arousal/Alertness: Awake/alert Behavior During Therapy: WFL for tasks assessed/performed Overall Cognitive Status: Within Functional Limits for tasks assessed                                        Home Living Family/patient expects to be discharged to:: Private residence Living Arrangements: Children Available Help at Discharge: Family;Personal care attendant;Available 24 hours/day Type of Home: Apartment Home Access: Ramped entrance     Home Layout: One level     Bathroom Shower/Tub: Tub/shower unit;Sponge bathes at baseline   Bathroom Toilet: Standard Bathroom Accessibility: No   Home Equipment: Conservation officer, nature (2 wheels);BSC/3in1;Grab bars - tub/shower;Tub bench;Wheelchair - manual;Other (comment)   Additional Comments: has L leg prosthesis      Prior Functioning/Environment Prior Level of Function : Needs assist       Physical Assist : Mobility (physical);ADLs (physical) Mobility (physical): Bed mobility;Transfers;Gait ADLs (physical): Bathing;Dressing;Toileting;IADLs Mobility Comments: Pt requires assist to pull up to sit EOB and supervision for transfers and gait with RW and prosthesis donned up to ~20 ft, uses w/c for longer distance mobility. ADLs Comments: Daughter or aide provides pericare and assists with bathing and dressing, cleaning, and cooking. Uses transportation services.        OT Problem List: Decreased strength;Decreased activity tolerance;Impaired balance (sitting and/or standing);Impaired vision/perception;Pain;Obesity      OT Treatment/Interventions: Self-care/ADL training;Therapeutic activities;Visual/perceptual remediation/compensation;Patient/family education;DME and/or AE instruction;Balance training    OT Goals(Current goals can be found in the care plan section) Acute Rehab OT Goals Patient  Stated Goal: have my would heal so I can get my mobility up OT Goal Formulation: With patient Time For Goal Achievement: 01/02/22 Potential to Achieve Goals: Fair  OT Frequency: Min 2X/week    Co-evaluation              AM-PAC OT "6 Clicks" Daily Activity     Outcome Measure Help from another person eating meals?: None Help from another person taking care of personal grooming?: A Little Help from another person toileting, which includes using toliet, bedpan, or urinal?: A Little Help from another person bathing (including washing, rinsing, drying)?: A Lot Help from another person to put on and taking off regular upper body clothing?: A Little Help from another person to put on and taking off regular lower body clothing?: A Lot 6 Click Score: 17   End of Session    Activity Tolerance: Patient limited by pain;Patient limited by fatigue Patient left: in bed;with call bell/phone within reach  OT Visit Diagnosis: Muscle weakness (generalized) (M62.81);Pain                Time: 7341-9379 OT Time Calculation (min): 19 min Charges:  OT General Charges $OT Visit: 1 Visit OT Evaluation $OT Eval Moderate Complexity: 1 Mod  12/19/2021  RP, OTR/L  Acute Rehabilitation Services  Office:  [phone]   Metta Clines 12/19/2021, 3:10 PM

## 2021-12-20 DIAGNOSIS — I10 Essential (primary) hypertension: Secondary | ICD-10-CM

## 2021-12-20 DIAGNOSIS — R739 Hyperglycemia, unspecified: Secondary | ICD-10-CM | POA: Diagnosis not present

## 2021-12-20 DIAGNOSIS — E08621 Diabetes mellitus due to underlying condition with foot ulcer: Secondary | ICD-10-CM

## 2021-12-20 DIAGNOSIS — L97508 Non-pressure chronic ulcer of other part of unspecified foot with other specified severity: Secondary | ICD-10-CM | POA: Diagnosis not present

## 2021-12-20 DIAGNOSIS — G894 Chronic pain syndrome: Secondary | ICD-10-CM

## 2021-12-20 LAB — BASIC METABOLIC PANEL
Anion gap: 12 (ref 5–15)
BUN: 48 mg/dL — ABNORMAL HIGH (ref 6–20)
CO2: 24 mmol/L (ref 22–32)
Calcium: 8.9 mg/dL (ref 8.9–10.3)
Chloride: 92 mmol/L — ABNORMAL LOW (ref 98–111)
Creatinine, Ser: 1.62 mg/dL — ABNORMAL HIGH (ref 0.44–1.00)
GFR, Estimated: 40 mL/min — ABNORMAL LOW (ref >60)
Glucose, Bld: 556 mg/dL (ref 70–99)
Potassium: 4.1 mmol/L (ref 3.5–5.1)
Sodium: 128 mmol/L — ABNORMAL LOW (ref 135–145)

## 2021-12-20 LAB — GLUCOSE, CAPILLARY
Glucose-Capillary: 357 mg/dL — ABNORMAL HIGH (ref 70–99)
Glucose-Capillary: 457 mg/dL — ABNORMAL HIGH (ref 70–99)
Glucose-Capillary: 459 mg/dL — ABNORMAL HIGH (ref 70–99)
Glucose-Capillary: 464 mg/dL — ABNORMAL HIGH (ref 70–99)
Glucose-Capillary: 497 mg/dL — ABNORMAL HIGH (ref 70–99)
Glucose-Capillary: 509 mg/dL (ref 70–99)

## 2021-12-20 LAB — CBC
HCT: 36.6 % (ref 36.0–46.0)
Hemoglobin: 12 g/dL (ref 12.0–15.0)
MCH: 30.2 pg (ref 26.0–34.0)
MCHC: 32.8 g/dL (ref 30.0–36.0)
MCV: 92 fL (ref 80.0–100.0)
Platelets: 362 10*3/uL (ref 150–400)
RBC: 3.98 MIL/uL (ref 3.87–5.11)
RDW: 13.5 % (ref 11.5–15.5)
WBC: 10.9 10*3/uL — ABNORMAL HIGH (ref 4.0–10.5)
nRBC: 0 % (ref 0.0–0.2)

## 2021-12-20 MED ORDER — TORSEMIDE 20 MG PO TABS: 80.0000 mg | ORAL_TABLET | Freq: Every day | ORAL | Status: DC

## 2021-12-20 MED ORDER — INSULIN ASPART 100 UNIT/ML IJ SOLN
10.0000 [IU] | Freq: Once | INTRAMUSCULAR | Status: AC
  Administered 2021-12-20: 10 [IU] via SUBCUTANEOUS

## 2021-12-20 MED ORDER — INSULIN ASPART 100 UNIT/ML IJ SOLN
25.0000 [IU] | Freq: Once | INTRAMUSCULAR | Status: AC
  Administered 2021-12-20: 25 [IU] via SUBCUTANEOUS

## 2021-12-20 MED ORDER — ENOXAPARIN SODIUM 80 MG/0.8ML IJ SOSY
80.0000 mg | PREFILLED_SYRINGE | INTRAMUSCULAR | Status: DC
  Administered 2021-12-20: 80 mg via SUBCUTANEOUS
  Filled 2021-12-20 (×2): qty 0.8

## 2021-12-20 MED ORDER — INSULIN ASPART 100 UNIT/ML IJ SOLN
4.0000 [IU] | Freq: Once | INTRAMUSCULAR | Status: AC
  Administered 2021-12-20: 4 [IU] via SUBCUTANEOUS

## 2021-12-20 MED ORDER — AMOXICILLIN-POT CLAVULANATE 875-125 MG PO TABS
1.0000 | ORAL_TABLET | Freq: Two times a day (BID) | ORAL | Status: DC
  Administered 2021-12-20 – 2021-12-21 (×3): 1 via ORAL
  Filled 2021-12-20 (×5): qty 1

## 2021-12-20 NOTE — Progress Notes (Signed)
FPTS Brief Progress Note  S:Went to check on patient, patient sleep in bed, wearing CPAP.   O: BP (!) 160/52 (BP Location: Right Wrist)    Pulse 80    Temp (!) 97.4 F (36.3 C) (Oral)    Resp 15    Ht 5\' 2"  (1.575 m)    Wt (!) 155.6 kg    SpO2 95%    BMI 62.74 kg/m     A/P: -Plans per day team - Orders reviewed. Labs for AM ordered, which was adjusted as needed.    Holley Bouche, MD 12/20/2021, 9:43 PM PGY-1, [place] Night Resident  Please page [phone] with questions.

## 2021-12-20 NOTE — Progress Notes (Signed)
Explained to pt the reason she needs to be npo as per the md's orders. Pt has regular drinks from the vending machine as well as chocolate bar at bedside. Pt is aware of her elevated cbg. Will continue to monitor.

## 2021-12-20 NOTE — Progress Notes (Signed)
Recheck cbg after dose of 10 units of Novolog sq. Result is now 509. Spoke with on call provider and rec'd an order for 20 additional units of Novolog sq. Verbal order verifed by read back.

## 2021-12-20 NOTE — Progress Notes (Signed)
PT Cancellation Note  Patient Details Name: [name] MRN: [id number] DOB: [birth year]   Cancelled Treatment:    Reason Eval/Treat Not Completed: Patient declined, no reason specified  Refusing PT today;  Will check back tomorrow;   Roney Marion, [place] Pager [phone] Office [phone]    Colletta [place] 12/20/2021, 11:46 AM

## 2021-12-20 NOTE — Progress Notes (Signed)
Due to her BMI of 62, d/w Dr Janus Molder and we will change her to moderate dose lovenox instead of SQ heparin.   Dc heparin Lovenox 0.5mg /kg/day = 80mg  SQ qday Level if needed  Onnie Boer, PharmD, BCIDP, AAHIVP, CPP Infectious Disease Pharmacist 12/20/2021 1:50 PM

## 2021-12-20 NOTE — Progress Notes (Signed)
Rec'd telephone call from lab with critical lab value. Glucose 556. On call provider notified and did give verbal order for 10 un regular insulin x1 stat. Verbal order verified by read back.

## 2021-12-20 NOTE — Progress Notes (Signed)
MD notified of 497 blood sugar, orders received to administer 25 units Novolog.

## 2021-12-20 NOTE — Progress Notes (Signed)
Family Medicine Teaching Service Daily Progress Note Intern Pager: [phone]  Patient name: [name] Medical record number: [id number] Date of birth: 1976/07/21 Age: 46 y.o. Gender: female  Primary Care Provider: [name], MD Consultants: Ortho Code Status: Full  Pt Overview and Major Events to Date:  1/14- Admitted  Assessment and Plan: [name] is a 46 year old female presenting with foot pain found to be in hyperglycemic crisis.  Past medical history significant for T2DM, morbid obesity, obesity hypoventilation syndrome, OSA wears BiPAP at night, hypertension, asthma, GERD, hyperlipidemia, restless leg syndrome, left BKA, mood disorder, history of chest pain with low risk for cardiac etiology.   Right Foot Ulcer  Patient has nonhealing ulcer on her right transmetatarsal amputation site that had worsening pain, draining and bleeding and came to ED for evaluation. VSS. WBC 10.9.  Ortho consulted.  Antibiotics started.  This morning patient has ulcer covered with dressing.  She continues to have pain in the right foot from the ulcer.  Of note patient found upside down in bed.  She stated she moved this way and does not need help moving, she is just restless and does not need any help at this time. -Ortho following, appreciate recs -Continue vancomycin and Zosyn  -Wound care consulted, appreciate recommendations -Monitor fever curve  -Tylenol 650 mg q6h prn  -PT/OT eval   HHS   Hyperglycemic crisis   poorly controlled T2DM  Glucose of 904 on admission.  Most recent 497.  Transitioned off of Endo tool.  Receiving 45 units of Semglee today.  -Appreciate help from diabetes coordinator - Semglee 45 units - Resistant sliding scale - Nightly coverage - CBGs 4 times daily before meals and at bedtime  Right Ireitis Patient comes from opthalmology appointment where she was noted to have an Iretis, taking atropine and prednisolone ophthalmic drops -Continue atropine 1%, 1  drop daily -Continue prednisolone 1%, 1 drop q2h while awake  Recent symptoms of yeast infection  Had been worsening and ongoing. Was given fluconazole x 1 and terconazole vaginal cream.  -Monitor symptoms   AKI on CKD3a  Creatinine of 1.76 in ED. Most recent values around 1.6. Baseline 1.3-1.4 -mIVF -Avoid nephrotoxic agents  -Monitor on AM BMP   HLD  Home medication of atorvastatin 80 mg daily, although patient not taking. Last Lipid panel 02/10/21 with LDL 192, TG 486. -Restart atovastatin  - Repeat Lipid panel outpt  Asthma   obesity hypoventilation syndrome   OSA VSS. No increased WOB. Home medications of Symbicort 2 puffs twice daily, and albuterol as needed. On 3L BiPAP @ night -Dulera twice daily -Albuterol prn -Continue BiPAP qhs  HFpEF  Patient asking for home torsemide. Stating increased WOB. On exam, CTAB, normal effort. Will add back medication as kidney function improves. Home medication of torsemide 80 mg daily.  -Hold torsemide in setting of AKI; add back when appropriate -Monitor fluid status   Hx of hypokalemia  K 4.1 Takes home medication of potassium chloride 20 mEq daily.  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 No know history of cardiac events. Currently prescribed aspirin 81 mg daily, patient not taking. Patient also prescribed torsemide 80 mg daily for presumed HFpEF.  -Continue aspirin  -Hold torsemide for AKI  Chronic Pain  Mostly back and knee pain from morbid obesity. Home medication of percocet 7.5 mg q8h prn. Per PDMP  last filled 12/04/21 for 30 days.  -Continue home medication.   Chronic and stable conditions:  Vitamin D deficiency  RLS  Insomnia- Continue ramelteon 8 mg at bedtime.  GERD Iron deficiency anemia   FEN/GI: NPO Prophylaxis: Heparin Dispo:Home pending clinical improvement .    Subjective:  States she is restless and really unable to sleep.  She continues to have right foot pain.  Would like to give pain medication and restart her torsemide.  Objective: Temp:  [97.6 F (36.4 C)-98.5 F (36.9 C)] 98.4 F (36.9 C) (01/15 0749) Pulse Rate:  [81-100] 81 (01/15 0749) Resp:  [11-22] 12 (01/15 0749) BP: (97-151)/(50-117) 109/59 (01/15 0749) SpO2:  [92 %-100 %] 97 % (01/15 0749) Weight:  [155.6 kg] 155.6 kg (01/14 1104) Physical Exam: General: Turned upside down in bed, no acute distress. Age appropriate. Cardiac: RRR, normal heart sounds, no murmurs Respiratory: CTAB, normal effort Abdomen: soft, nontender, nondistended Extremities: Right foot ulcer covered by dressing Skin: Warm and dry, no rashes noted Neuro: alert and oriented good Psych: normal affect   Laboratory: Recent Labs  Lab 12/18/21 2328 12/19/21 0810 12/19/21 0811 12/20/21 0238  WBC 10.9*  --   --  10.9*  HGB 11.9* 14.6 14.6 12.0  HCT 37.6 43.0 43.0 36.6  PLT 353  --   --  362   Recent Labs  Lab 12/18/21 2328 12/19/21 0810 12/19/21 0811 12/20/21 0238  NA 121* 121* 121* 128*  K 4.2 4.3 4.4 4.1  CL 81*  --  87* 92*  CO2 25  --   --  24  BUN 53*  --  69* 48*  CREATININE 1.76*  --  1.40* 1.62*  CALCIUM 8.7*  --   --  8.9  PROT 7.9  --   --   --   BILITOT 0.3  --   --   --   ALKPHOS 200*  --   --   --   ALT 18  --   --   --   AST 16  --   --   --   GLUCOSE 904*  --  >700* 556*    Imaging/Diagnostic Tests: MRI OF THE RIGHT FOREFOOT WITHOUT AND WITH CONTRAST IMPRESSION: 1. Soft tissue ulceration at the medial aspect of the distal stump closely approximating the resection margin of the residual first metatarsal. Mild bone marrow edema and enhancement within the residual first metatarsal distally is suspicious for early acute osteomyelitis. 2. No organized or rim-enhancing fluid collection.  Gerlene Fee, DO 12/20/2021, 8:56 AM PGY-3, [place] Intern pager: [phone], text pages welcome

## 2021-12-20 NOTE — Progress Notes (Signed)
Patient resting on CPAP auto settings. Upon entering room SPO2 was 83%, placed on 2L. Patient states she wears 2L at home. Spo2 currently 95%

## 2021-12-20 NOTE — Plan of Care (Signed)
  Problem: Elimination: Goal: Will not experience complications related to bowel motility Outcome: Progressing Goal: Will not experience complications related to urinary retention Outcome: Progressing   Problem: Activity: Goal: Risk for activity intolerance will decrease Outcome: Progressing   

## 2021-12-20 NOTE — Plan of Care (Signed)

## 2021-12-20 NOTE — Progress Notes (Signed)
Inpatient Diabetes Program Recommendations  AACE/ADA: New Consensus Statement on Inpatient Glycemic Control   Target Ranges:  Prepandial:   less than 140 mg/dL      Peak postprandial:   less than 180 mg/dL (1-2 hours)      Critically ill patients:  140 - 180 mg/dL    Latest Reference Range & Units 12/20/21 00:07 12/20/21 05:08 12/20/21 07:51  Glucose-Capillary 70 - 99 mg/dL 464 (H) 509 (HH) 497 (H)    Latest Reference Range & Units 12/18/21 23:28  CO2 22 - 32 mmol/L 25  Glucose 70 - 99 mg/dL 904 (HH)  BUN 6 - 20 mg/dL 53 (H)  Creatinine 0.44 - 1.00 mg/dL 1.76 (H)  Calcium 8.9 - 10.3 mg/dL 8.7 (L)  Anion gap 5 - 15  15   Review of Glycemic Control  Diabetes history: DM2 Outpatient Diabetes medications: Tresiba 70 units BID, Novolog 60 units TID with meals, Trulicity 3.15 mg Qweek (Sunday) Current orders for Inpatient glycemic control: Semglee 45 units daily, Novolog 0-20 units TID with meals, Novolog 0-5 units QHS  Inpatient Diabetes Program Recommendations:    Insulin: Please consider ordering Semglee 20 units QHS and ordering Novolog 6 units TID with meals for meal coverage if patient eats at least 50% of meals.  NOTE: Noted consult for Diabetes Coordinator. Diabetes Coordinator is not on campus over the weekend but available by pager from 8am to 5pm for questions or concerns. Chart reviewed. Noted patient seen Dr. Norma Fredrickson with [facility] End[facility]inology on 11/03/21 and per note patient was prescribed Tresiba 60 units BID, Novolog 50 units TID with meals, and Trulicity 3 mg Qweek for DM control. Noted telephone note on 11/25/21 by H. Mitchell with [facility] Endocrinology and patient was advised to "Increase Tresiba to 70 units and 70 units at night. Every 3 days, if fasting blood is still greater than 150mg /dl, increase morning tresiba by 5 units and night tresiba by 5 units. Increase Novolog to 60 units before meals."   Patient was last seen by inpatient diabetes coordinator on 10/16/21 during  prior hospitalization. Noted patient received Semglee 35 units on 12/19/21 and already given 45 units today.  Thanks, Barnie Alderman, RN, MSN, CDE Diabetes Coordinator Inpatient Diabetes Program [phone] (Team Pager from 8am to 5pm)

## 2021-12-20 NOTE — Progress Notes (Signed)
FPTS Interim Progress Note  S:Patient sitting on the edge of the bed and watching videos on her phone, states that she slept all day and is not very tired but will try to rest. Denies any concerns at this time. Discussed plan for orthopedics formal evaluation to likely take place tomorrow.   O: BP (!) 97/50 (BP Location: Left Arm)    Pulse 100    Temp 97.6 F (36.4 C) (Oral)    Resp 13    Ht 5\' 2"  (1.575 m)    Wt (!) 155.6 kg    SpO2 93%    BMI 62.74 kg/m   General: Patient comfortable, in no acute distress.  Resp: normal work of breathing, no signs of respiratory distress, able to talk in complete sentences Psych: mood appropriate, pleasant   A/P: -Orthopedics evaluation tomorrow, awaiting and appreciate recommendations -Hyperglycemia: on sliding scale, continue to monitor CBGs with administration of insulin as appropriate  -Remainder of plan per day team note   Donney Dice, DO 12/20/2021, 1:42 AM PGY-2, [place] Medicine Service pager [phone]

## 2021-12-20 NOTE — Progress Notes (Signed)
Pt blood glucose at hs was 534, rec'd order from provider for 8 un regular insulin, rechecked as ordered and glucose level was 439, on call provider gave order for and addition 4un of regular. Pt remains non-compliant with diabetic diet. Pt has daughters at bedside. Was eating pizza and drinking regular drinks. Will continue to monitor and tx as indicated.

## 2021-12-21 ENCOUNTER — Ambulatory Visit: Payer: Medicaid Other | Admitting: Family Medicine

## 2021-12-21 DIAGNOSIS — H209 Unspecified iridocyclitis: Secondary | ICD-10-CM | POA: Diagnosis not present

## 2021-12-21 DIAGNOSIS — L97508 Non-pressure chronic ulcer of other part of unspecified foot with other specified severity: Secondary | ICD-10-CM | POA: Diagnosis not present

## 2021-12-21 DIAGNOSIS — E1122 Type 2 diabetes mellitus with diabetic chronic kidney disease: Secondary | ICD-10-CM

## 2021-12-21 DIAGNOSIS — E08621 Diabetes mellitus due to underlying condition with foot ulcer: Secondary | ICD-10-CM | POA: Diagnosis not present

## 2021-12-21 DIAGNOSIS — N1831 Chronic kidney disease, stage 3a: Secondary | ICD-10-CM

## 2021-12-21 DIAGNOSIS — E11621 Type 2 diabetes mellitus with foot ulcer: Secondary | ICD-10-CM

## 2021-12-21 DIAGNOSIS — E1165 Type 2 diabetes mellitus with hyperglycemia: Principal | ICD-10-CM

## 2021-12-21 DIAGNOSIS — E1100 Type 2 diabetes mellitus with hyperosmolarity without nonketotic hyperglycemic-hyperosmolar coma (NKHHC): Secondary | ICD-10-CM

## 2021-12-21 DIAGNOSIS — N179 Acute kidney failure, unspecified: Secondary | ICD-10-CM

## 2021-12-21 DIAGNOSIS — L97519 Non-pressure chronic ulcer of other part of right foot with unspecified severity: Secondary | ICD-10-CM

## 2021-12-21 DIAGNOSIS — R739 Hyperglycemia, unspecified: Secondary | ICD-10-CM | POA: Diagnosis not present

## 2021-12-21 LAB — BASIC METABOLIC PANEL
Anion gap: 11 (ref 5–15)
BUN: 38 mg/dL — ABNORMAL HIGH (ref 6–20)
CO2: 24 mmol/L (ref 22–32)
Calcium: 8.7 mg/dL — ABNORMAL LOW (ref 8.9–10.3)
Chloride: 96 mmol/L — ABNORMAL LOW (ref 98–111)
Creatinine, Ser: 1.18 mg/dL — ABNORMAL HIGH (ref 0.44–1.00)
GFR, Estimated: 59 mL/min — ABNORMAL LOW (ref >60)
Glucose, Bld: 364 mg/dL — ABNORMAL HIGH (ref 70–99)
Potassium: 3.9 mmol/L (ref 3.5–5.1)
Sodium: 131 mmol/L — ABNORMAL LOW (ref 135–145)

## 2021-12-21 LAB — CBC
HCT: 32.7 % — ABNORMAL LOW (ref 36.0–46.0)
Hemoglobin: 10.5 g/dL — ABNORMAL LOW (ref 12.0–15.0)
MCH: 30 pg (ref 26.0–34.0)
MCHC: 32.1 g/dL (ref 30.0–36.0)
MCV: 93.4 fL (ref 80.0–100.0)
Platelets: 354 10*3/uL (ref 150–400)
RBC: 3.5 MIL/uL — ABNORMAL LOW (ref 3.87–5.11)
RDW: 13.6 % (ref 11.5–15.5)
WBC: 9.7 10*3/uL (ref 4.0–10.5)
nRBC: 0 % (ref 0.0–0.2)

## 2021-12-21 LAB — GLUCOSE, CAPILLARY
Glucose-Capillary: 479 mg/dL — ABNORMAL HIGH (ref 70–99)
Glucose-Capillary: 409 mg/dL — ABNORMAL HIGH (ref 70–99)
Glucose-Capillary: 377 mg/dL — ABNORMAL HIGH (ref 70–99)

## 2021-12-21 LAB — GLUCOSE, RANDOM: Glucose, Bld: 420 mg/dL — ABNORMAL HIGH (ref 70–99)

## 2021-12-21 MED ORDER — COLLAGENASE 250 UNIT/GM EX OINT
TOPICAL_OINTMENT | Freq: Two times a day (BID) | CUTANEOUS | Status: DC
  Filled 2021-12-21: qty 30

## 2021-12-21 MED ORDER — INSULIN ASPART 100 UNIT/ML IJ SOLN
20.0000 [IU] | Freq: Once | INTRAMUSCULAR | Status: AC
  Administered 2021-12-21: 20 [IU] via SUBCUTANEOUS

## 2021-12-21 MED ORDER — GABAPENTIN 300 MG PO CAPS: 900.0000 mg | ORAL_CAPSULE | Freq: Three times a day (TID) | ORAL | 0 refills | Status: DC

## 2021-12-21 MED ORDER — ATROPINE SULFATE 1 % OP SOLN: 1.0000 [drp] | Freq: Every day | OPHTHALMIC | 12 refills | Status: DC

## 2021-12-21 MED ORDER — PREDNISOLONE ACETATE 1 % OP SUSP: 1.0000 [drp] | OPHTHALMIC | 0 refills | Status: DC

## 2021-12-21 MED ORDER — AMOXICILLIN-POT CLAVULANATE 875-125 MG PO TABS: 1.0000 | ORAL_TABLET | Freq: Two times a day (BID) | ORAL | 0 refills | Status: AC

## 2021-12-21 NOTE — Discharge Summary (Signed)
[facility] Discharge Summary  Patient name: [name] Medical record number: [id number] Date of birth: [birth year] Age: 46 y.o. Gender: female Date of Admission: 12/18/2021  Date of Discharge: 12/21/2020 Admitting Physician: Belinda Covert, MD  Primary Care Provider: [name], MD Consultants: Orthopedic Surgery  Indication for Hospitalization: Concern for Osteomyelitis and Hyperglycemic Crisis  Discharge Diagnoses/Problem List:  Hyperglycemia Diabetic ulcer of foot with diabetes mellitus  Disposition: Home  Discharge Condition: Stable  Discharge Exam:  General: NAD, non-toxic, sitting on side of bed  Cardiovascular: RRR no m/r/g Respiratory: CTAB no w/r/c Abdomen: soft, obese habitus, nontender to palpation Extremities: R leg in compression stocking and sock, no leakage of wound through sock. L BKA  Brief Hospital Course:  [name] is a 46 year old female presenting with foot pain found to be in hyperglycemic crisis.  PMH significant for T2DM, morbid obesity, obesity hypoventilation syndrome, OSA wears BiPAP at night, HTN, asthma, GERD, hyperlipidemia, restless leg syndrome, left BKA, mood disorder, history of chest pain with low risk for cardiac etiology.   Right foot ulcer Nonhealing ulcer on right transmetatarsal amputation site. Antibiotics of vancomycin and Zosyn were started and has transitioned to Augmentin on 1/15.  Right foot x-ray showed peripheral soft tissue swelling and stump ulceration without definite evidence of osteomyelitis. R foot MRI showed mild bone marrow edema and enhancement that is suspicious for early acute osteomyelitis. Orthopedic surgery was consulted and did not advise surgical intervention. Was discharged on augmentin twice daily for 4 weeks  for concern of osteomyelitis. Patient remained hemodynamically stable and afebrile. PT recommended CIR but patient requested going home.     HHS   hyperglycemic  crisis   poorly controlled T2DM Glucose of 904 on admission.  Anion gap was to 15.  Had venous blood gas showed pH of 7.411, pCO2 42.9, pO2 37, bicarb 27.2. Met criteria for HHS and was made NPO while on insulin drip. Resolved at end of hospitalization and was transition to long acting with rSSI.    R Ireitis Eye still red on examination, continued atropine 1% 1 drop daily and prednisolone 1 % 1 drop q2h while awake from ophthalmologist.  AKI on CKD3a  Creatinine of 1.76 in ED. Most recent values around 1.3-1.4. Was resolved with mIVF.  Chronic conditions remained stable  Issues for Follow Up:  Ensure Augmentin course ongoing for 4 weeks If R foot wound becomes purulent, consider adding doxycyline in addition to augmentin course if wound becomes purulent (per ID pharmacist) Ensure close orthopedic follow up Endocrinology follow up for resistant diabetes Monitor torsemide dose with kidney function (AKI resolved)  Significant Procedures: None  Significant Labs and Imaging:  Recent Labs  Lab 12/18/21 2328 12/19/21 0810 12/19/21 0811 12/20/21 0238 12/21/21 0157  WBC 10.9*  --   --  10.9* 9.7  HGB 11.9*   < > 14.6 12.0 10.5*  HCT 37.6   < > 43.0 36.6 32.7*  PLT 353  --   --  362 354   < > = values in this interval not displayed.   Recent Labs  Lab 12/18/21 2328 12/19/21 0810 12/19/21 0811 12/20/21 0238 12/21/21 0157 12/21/21 1206  NA 121* 121* 121* 128* 131*  --   K 4.2 4.3 4.4 4.1 3.9  --   CL 81*  --  87* 92* 96*  --   CO2 25  --   --  24 24  --   GLUCOSE 904*  --  >700* 556* 364*  420*  BUN 53*  --  69* 48* 38*  --   CREATININE 1.76*  --  1.40* 1.62* 1.18*  --   CALCIUM 8.7*  --   --  8.9 8.7*  --   ALKPHOS 200*  --   --   --   --   --   AST 16  --   --   --   --   --   ALT 18  --   --   --   --   --   ALBUMIN 2.8*  --   --   --   --   --       Results/Tests Pending at Time of Discharge:   Discharge Medications:  Allergies as of 12/21/2021       Reactions    Cefepime Other (See Comments)   AKI, see records from [place] hospitalization in January 2020.   Other reaction(s): Other (See Comments) Note pt has tolerated Rocephin and Keflex   Kiwi Extract Shortness Of Breath, Swelling, Anaphylaxis   Morphine And Related Nausea And Vomiting   Nalbuphine    Other reaction(s): Hallucinations, Other (See Comments) "FEELS LIKE SOMETHING CRAWLING ON ME" Reaction:  Nervousness "FEELS LIKE SOMETHING CRAWLING ON ME" Reaction:  Nervousness Reaction:  Nervousness "FEELS LIKE SOMETHING CRAWLING ON ME"   Trental [pentoxifylline] Nausea And Vomiting   Toradol [ketorolac Tromethamine] Other (See Comments)   Feels like something is crawling on me   Morphine Nausea And Vomiting   Nubain [nalbuphine Hcl] Other (See Comments)   "FEELS LIKE SOMETHING CRAWLING ON ME"        Medication List     STOP taking these medications    fluconazole 150 MG tablet Commonly known as: DIFLUCAN   gabapentin 600 MG tablet Commonly known as: NEURONTIN Replaced by: gabapentin 300 MG capsule   ondansetron 4 MG disintegrating tablet Commonly known as: ZOFRAN-ODT   rOPINIRole 1 MG tablet Commonly known as: REQUIP       TAKE these medications    Accu-Chek Guide test strip Generic drug: glucose blood USE T0 CHECK BLOOD SUGAR 3 TIMES DAILY   Accu-Chek Softclix Lancets lancets Use to check blood glucose four times daily   Accu-Chek FastClix Lancets Misc USE DAILY AS INSTRUCTED   albuterol 108 (90 Base) MCG/ACT inhaler Commonly known as: VENTOLIN HFA INHALE 1 TO 2 PUFFS BY MOUTH EVERY SIX HOURS AS NEEDED FOR WHEEZING OR SHORTNESS OF BREATH What changed: See the new instructions.   amoxicillin-clavulanate 875-125 MG tablet Commonly known as: AUGMENTIN Take 1 tablet by mouth every 12 (twelve) hours.   aspirin EC 81 MG tablet Take 1 tablet (81 mg total) by mouth daily. Swallow whole.   atorvastatin 80 MG tablet Commonly known as: LIPITOR Take 1 tablet (80 mg  total) by mouth daily.   atropine 1 % ophthalmic solution Place 1 drop into the right eye daily. Start taking on: December 22, 2021   Baqsimi One Pack 3 MG/DOSE Powd Generic drug: Glucagon Place 3 mg into the nose as needed (low blood sugar).   Cholecalciferol 25 MCG (1000 UT) tablet Take 1 tablet (1,000 Units total) by mouth daily.   Dexcom G6 Sensor Misc Inject 1 applicator into the skin as directed. Change sensor every 10 days.   Dexcom G6 Transmitter Misc Inject 1 Device into the skin as directed. Reuse 8 times with sensor changes.   gabapentin 300 MG capsule Commonly known as: NEURONTIN Take 3 capsules (900 mg total) by mouth  3 (three) times daily. Replaces: gabapentin 600 MG tablet   Glucagon Emergency 1 MG Kit Inject 1 mg into the muscle as needed for low blood sugar.   GNP UltiCare Pen Needles 32G X 4 MM Misc Generic drug: Insulin Pen Needle USE TO inject insulin 2 TIMES DAILY   Iron 325 (65 Fe) MG Tabs Take 1 tablet (325 mg total) by mouth every other day.   NovoLOG FlexPen 100 UNIT/ML FlexPen Generic drug: insulin aspart Inject 20 Units into the skin 3 (three) times daily with meals. What changed: how much to take   omeprazole 40 MG capsule Commonly known as: PRILOSEC Take 1 capsule (40 mg total) by mouth daily.   oxyCODONE-acetaminophen 7.5-325 MG tablet Commonly known as: PERCOCET Take 1 tablet by mouth every 8 (eight) hours as needed for severe pain. Start taking on: January 02, 2022   potassium chloride SA 20 MEQ tablet Commonly known as: Klor-Con M20 Take 1 tablet (20 mEq total) by mouth daily.   prednisoLONE acetate 1 % ophthalmic suspension Commonly known as: PRED FORTE Place 1 drop into the right eye every 2 (two) hours while awake.   ramelteon 8 MG tablet Commonly known as: ROZEREM TAKE 1 TABLET BY MOUTH AT BEDTIME   Spiriva Respimat 1.25 MCG/ACT Aers Generic drug: Tiotropium Bromide Monohydrate Inhale 2 puffs into the lungs daily.    Symbicort 160-4.5 MCG/ACT inhaler Generic drug: budesonide-formoterol INHALE 2 PUFFS INTO THE LUNGS 2 TIMES DAILY   terconazole 0.4 % vaginal cream Commonly known as: TERAZOL 7 Place 1 applicator vaginally at bedtime.   torsemide 20 MG tablet Commonly known as: DEMADEX TAKE 4 TABLETS BY MOUTH ONCE A DAY What changed: See the new instructions.   Industrial/product designer Use daily   Tresiba FlexTouch 200 UNIT/ML FlexTouch Pen Generic drug: insulin degludec Inject 46 Units into the skin 2 (two) times daily. What changed: how much to take   Trulicity 1.5 GN/5.6OZ Sopn Generic drug: Dulaglutide inject 1.52m into THE SKIN ONCE A WEEK What changed: See the new instructions.        Discharge Instructions: Please refer to Patient Instructions section of EMR for full details.  Patient was counseled important signs and symptoms that should prompt return to medical care, changes in medications, dietary instructions, activity restrictions, and follow up appointments.   Follow-Up Appointments:  Follow-up Information     Haddix, KThomasene Lot MD. Schedule an appointment as soon as possible for a visit.   Specialty: Orthopedic Surgery Why: For your foot ulcer/possible bone infection Contact information: [phone]                 JGerrit Heck MD 12/21/2021, 4:22 PM PGY-1, [facility]

## 2021-12-21 NOTE — Consult Note (Signed)
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 Toes have been amputated.  MR of the foot as follows: Soft tissue ulceration at the medial aspect of the distal stump closely approximating the resection margin of the residual first metatarsal. Mild bone marrow edema and enhancement within the residual first metatarsal distally is suspicious for early acute osteomyelitis. Pressure Injury POA: NA Measurement: 1.2 x 2.3 x 0.2 Wound bed: 50 % black, 50 % yellow and pink with surrounding erythema and brown red drainage on dressing.  Dressing procedure/placement/frequency: Clean the wound on the right foot with NS, pat dry then apply a nickel thick layer of Santyl over the wound, cover with moistened saline gauze, dry gauze and wrap with small Kerlix. Change twice daily.  Monitor the wound area(s) for worsening of condition such as: Signs/symptoms of infection, increase in size, development of or worsening of odor, development of pain, or increased pain at the affected locations.   Notify the medical team if any of these develop.  Thank you for the consult. [facility] nurse will not follow at this time.   Please re-consult the [facility] team if needed.  Cathlean Marseilles Tamala Julian, MSN, RN, [place], Lysle Pearl, [facility] Wound Treatment Associate Pager [phone]

## 2021-12-21 NOTE — Progress Notes (Addendum)
Inpatient Diabetes Program Recommendations  AACE/ADA: New Consensus Statement on Inpatient Glycemic Control (2015)  Target Ranges:  Prepandial:   less than 140 mg/dL      Peak postprandial:   less than 180 mg/dL (1-2 hours)      Critically ill patients:  140 - 180 mg/dL   Lab Results  Component Value Date   GLUCAP 479 (H) 12/21/2021   HGBA1C >15.0 09/29/2021    Review of Glycemic Control  Latest Reference Range & Units 12/20/21 07:51 12/20/21 17:14 12/20/21 19:47 12/20/21 22:47 12/21/21 08:12  Glucose-Capillary 70 - 99 mg/dL 497 (H) 457 (H) 459 (H) 357 (H) 479 (H)  (H): Data is abnormally high  Diabetes history: DM2 Outpatient Diabetes medications: Tresiba 70 units BID, Novolog 60 units TID, Trulicity 1.5 mg every Sunday, Dexcom Current orders for Inpatient glycemic control: Semglee 45 units QD, Novolog 0-20 TID and 0-5 QHS  Inpatient Diabetes Program Recommendations:    Semglee  50 units BID Novolog 10 units TID with meals if eats at least 50%   Spoke with patient at bedside.  Confirmed above home medications.  She denies skipping doses; she rotates sites for injections.  She is current with endocrinology.  Has been on U-500 in the past.  She states her Dexcom reads 300-400 mg/dL mostly.  She drinks occasional sugary beverages.  Counseled on importance of eliminating these drinks.  She and her endocrinologist are discussing starting her on an insulin pump.    Will continue to follow while inpatient.  Thank you, Belinda Dixon, MSN, RN Diabetes Coordinator Inpatient Diabetes Program [phone] (team pager from 8a-5p)

## 2021-12-21 NOTE — Progress Notes (Signed)
Orthopaedic Trauma Progress Note  SUBJECTIVE: Sleeping comfortably in bed this morning.  Notes pain improving from when initially admitted.  Wound care nurse just changed dressing over ulceration this AM.  No other ortho issues of note.  OBJECTIVE:  Vitals:   12/21/21 0635 12/21/21 0918  BP: (!) 109/47 129/77  Pulse: 77 78  Resp: 17   Temp: 98.4 F (36.9 C) 98 F (36.7 C)  SpO2: 97% 99%    General: Resting in bed comfortably, no acute distress  Respiratory: No increased work of breathing.  RLE: Dressing over site of ulceration that was applied this a.m. is clean, dry, intact.  This was not removed.  Foot warm and well-perfused.  TED hose in place.  IMAGING: MRI R foot shows cellulitis and some edema changes in the residual first metatarsal   LABS:  Results for orders placed or performed during the hospital encounter of 12/18/21 (from the past 24 hour(s))  Glucose, capillary     Status: Abnormal   Collection Time: 12/20/21  5:14 PM  Result Value Ref Range   Glucose-Capillary 457 (H) 70 - 99 mg/dL  Glucose, capillary     Status: Abnormal   Collection Time: 12/20/21  7:47 PM  Result Value Ref Range   Glucose-Capillary 459 (H) 70 - 99 mg/dL  Glucose, capillary     Status: Abnormal   Collection Time: 12/20/21 10:47 PM  Result Value Ref Range   Glucose-Capillary 357 (H) 70 - 99 mg/dL  CBC     Status: Abnormal   Collection Time: 12/21/21  1:57 AM  Result Value Ref Range   WBC 9.7 4.0 - 10.5 K/uL   RBC 3.50 (L) 3.87 - 5.11 MIL/uL   Hemoglobin 10.5 (L) 12.0 - 15.0 g/dL   HCT 32.7 (L) 36.0 - 46.0 %   MCV 93.4 80.0 - 100.0 fL   MCH 30.0 26.0 - 34.0 pg   MCHC 32.1 30.0 - 36.0 g/dL   RDW 13.6 11.5 - 15.5 %   Platelets 354 150 - 400 K/uL   nRBC 0.0 0.0 - 0.2 %  Basic metabolic panel     Status: Abnormal   Collection Time: 12/21/21  1:57 AM  Result Value Ref Range   Sodium 131 (L) 135 - 145 mmol/L   Potassium 3.9 3.5 - 5.1 mmol/L   Chloride 96 (L) 98 - 111 mmol/L   CO2 24 22 -  32 mmol/L   Glucose, Bld 364 (H) 70 - 99 mg/dL   BUN 38 (H) 6 - 20 mg/dL   Creatinine, Ser 1.18 (H) 0.44 - 1.00 mg/dL   Calcium 8.7 (L) 8.9 - 10.3 mg/dL   GFR, Estimated 59 (L) >60 mL/min   Anion gap 11 5 - 15  Glucose, capillary     Status: Abnormal   Collection Time: 12/21/21  8:12 AM  Result Value Ref Range   Glucose-Capillary 479 (H) 70 - 99 mg/dL   *Note: Due to a large number of results and/or encounters for the requested time period, some results have not been displayed. A complete set of results can be found in Results Review.    ASSESSMENT: [name] is a 46 y.o. female with hx of Type II DM 46 year old female with previous transmetatarsal amputation right side and BKA left lower extremity presenting with chronic wound and worsening pain/redness of right foot   PLAN: Weightbearing: WBAT RLE Incisional and dressing care: Per wound care Orthopedic device(s):  Pain management: continue current regimen VTE prophylaxis: Lovenox, SCDs ID:  Augmentin  Dispo: Continue antibiotics and care per medicine team. Please re-consult ortho surgery if new/worsening issues arise   Follow - up plan: With Dr. Doreatha Martin PRN  Contact information:  Katha Hamming MD, Rushie Nyhan PA-C. After hours and holidays please check [email or web address] for group call information for [facility]   Gwinda Passe, PA-C [phone] (office) [email or web address]

## 2021-12-21 NOTE — Evaluation (Signed)
Physical Therapy Evaluation Patient Details Name: [name] MRN: [id number] DOB: [birth year] Today's Date: 12/21/2021  History of Present Illness  47 y.o. female who presented with nonhealing ulcer on her right transmetatarsal amputation site.  She also reports severe headache and has not been able to see out of her eye, blurred vision after cataract surgery.  PMH: type 2 diabetes, morbid obesity, obesity hypoventilation syndrome, OSA wears BiPAP at night, hypertension, asthma, GERD, hyperlipidemia, restless leg syndrome, left BKA, R transmetatarsal amputation, mood disorder, hypertension, history of chest pain.  Clinical Impression  Received pt sitting EOB and requesting to toilet. Pt performed stand<>pivot transfers with RW and min A to/from bedside commode. Pt required assist to don/doff LLE prosthetic due to pain and body habitus. Pt continent of bowel and dependent for peri-care via anterior leans. Pt reports having an aide for 8 hours/day and her 20 y/o daughter assists the rest of the time. Pt vocalized desire to be as independent as possible upon D/C so that she can avoid having her daughter provide assist. Discussed recommendation for CIR and pt states she will need to talk to her daughter and think about it. Acute PT to cont to follow.      Recommendations for follow up therapy are one component of a multi-disciplinary discharge planning process, led by the attending physician.  Recommendations may be updated based on patient status, additional functional criteria and insurance authorization.  Follow Up Recommendations Acute inpatient rehab (3hours/day)    Assistance Recommended at Discharge Intermittent Supervision/Assistance  Patient can return home with the following  A lot of help with walking and/or transfers;A lot of help with bathing/dressing/bathroom;Assistance with cooking/housework;Assist for transportation;Help with stairs or ramp for entrance    Equipment Recommendations  Rolling walker (2 wheels)  Recommendations for Other Services  Rehab consult    Functional Status Assessment Patient has had a recent decline in their functional status and demonstrates the ability to make significant improvements in function in a reasonable and predictable amount of time.     Precautions / Restrictions Precautions Precautions: Fall Precaution Comments: RLE transmetatarsal amputation Required Braces or Orthoses: Other Brace Other Brace: L prosthetic Restrictions Weight Bearing Restrictions: No      Mobility  Bed Mobility Overal bed mobility: Needs Assistance Bed Mobility: Rolling;Supine to Sit;Sit to Supine Rolling: Supervision   Supine to sit: Min assist Sit to supine: Supervision   General bed mobility comments: pt required min HHA to pull up into sitting on EOB Patient Response: Cooperative  Transfers Overall transfer level: Needs assistance Equipment used: Rolling walker (2 wheels) Transfers: Sit to/from Stand;Bed to chair/wheelchair/BSC Sit to Stand: Min assist Stand pivot transfers: Min assist         General transfer comment: pt transferred to/from bedside commode from EOB usign RW and LLE prosthetic. Pt required total A to don prosthetic due to body habitus and pain    Ambulation/Gait                  Stairs            Wheelchair Mobility    Modified Rankin (Stroke Patients Only)       Balance Overall balance assessment: Needs assistance Sitting-balance support: Bilateral upper extremity supported;Feet supported Sitting balance-Leahy Scale: Good Sitting balance - Comments: pt sitting EOB upon therapist's arrival and at end of session   Standing balance support: Bilateral upper extremity supported (RW) Standing balance-Leahy Scale: Poor Standing balance comment: pt requires min guard/min A for standing balance  Pertinent Vitals/Pain Pain Assessment: No/denies pain Pain Score:  8  Pain Location: R foot Pain Descriptors / Indicators: Aching;Discomfort;Grimacing;Nagging Pain Intervention(s): Limited activity within patient's tolerance;Monitored during session;Premedicated before session;Repositioned    Home Living Family/patient expects to be discharged to:: Private residence Living Arrangements: Children (daughter (80 y/o)) Available Help at Discharge: Family;Personal care attendant;Available 24 hours/day (has aide for 8 hours/day and her daughter is there for the rest of the day) Type of Home: Apartment Home Access: Level entry       Home Layout: One level Home Equipment: Rolling Walker (2 wheels);BSC/3in1;Grab bars - tub/shower;Tub bench;Wheelchair - manual Additional Comments: has L leg prosthesis    Prior Function Prior Level of Function : Needs assist       Physical Assist : Mobility (physical);ADLs (physical) Mobility (physical): Bed mobility;Transfers;Gait ADLs (physical): Bathing;Dressing;Toileting;IADLs Mobility Comments: Pt requires assist to pull up to sit EOB and supervision for transfers and gait with RW and prosthesis donned up to ~20 ft, uses w/c for longer distance mobility. ADLs Comments: Daughter or aide provides pericare and assists with bathing and dressing, cleaning, and cooking. Uses transportation services.     Hand Dominance   Dominant Hand: Right    Extremity/Trunk Assessment        Lower Extremity Assessment Lower Extremity Assessment: Generalized weakness    Cervical / Trunk Assessment Cervical / Trunk Assessment: Normal;Other exceptions Cervical / Trunk Exceptions: body habitus  Communication   Communication: No difficulties  Cognition Arousal/Alertness: Awake/alert Behavior During Therapy: WFL for tasks assessed/performed Overall Cognitive Status: Within Functional Limits for tasks assessed                                          General Comments      Exercises     Assessment/Plan     PT Assessment Patient needs continued PT services  PT Problem List Decreased strength;Decreased range of motion;Decreased activity tolerance;Decreased balance;Decreased mobility;Decreased coordination;Decreased safety awareness;Impaired sensation;Obesity;Pain;Decreased skin integrity       PT Treatment Interventions DME instruction;Gait training;Functional mobility training;Therapeutic activities;Therapeutic exercise;Balance training;Neuromuscular re-education;Patient/family education;Wheelchair mobility training;Manual techniques;Modalities    PT Goals (Current goals can be found in the Care Plan section)  Acute Rehab PT Goals Patient Stated Goal: "to be independent" PT Goal Formulation: With patient Time For Goal Achievement: 01/04/22 Potential to Achieve Goals: Fair    Frequency Min 3X/week     Co-evaluation               AM-PAC PT "6 Clicks" Mobility  Outcome Measure Help needed turning from your back to your side while in a flat bed without using bedrails?: A Little Help needed moving from lying on your back to sitting on the side of a flat bed without using bedrails?: A Little Help needed moving to and from a bed to a chair (including a wheelchair)?: A Little Help needed standing up from a chair using your arms (e.g., wheelchair or bedside chair)?: A Little Help needed to walk in hospital room?: A Lot Help needed climbing 3-5 steps with a railing? : Total 6 Click Score: 15    End of Session Equipment Utilized During Treatment: Gait belt;Other (comment) (LLE prosthetic) Activity Tolerance: Patient tolerated treatment well Patient left: in bed;with call bell/phone within reach Nurse Communication: Mobility status PT Visit Diagnosis: Unsteadiness on feet (R26.81);Other abnormalities of gait and mobility (R26.89);Muscle weakness (generalized) (M62.81);Pain Pain - Right/Left: Right  Pain - part of body: Ankle and joints of foot    Time: 7371-0626 PT Time Calculation  (min) (ACUTE ONLY): 33 min   Charges:   PT Evaluation $PT Eval Moderate Complexity: 1 Mod PT Treatments $Therapeutic Activity: 8-22 mins        Becky Sax PT, DPT  Blenda Nicely 12/21/2021, 10:19 AM

## 2021-12-21 NOTE — Progress Notes (Signed)
Occupational Therapy Treatment Patient Details Name: [name] MRN: [id number] DOB: 14-Sep-1976 Today's Date: 12/21/2021   History of present illness 46 y.o. female who presented with nonhealing ulcer on her right transmetatarsal amputation site.  She also reports severe headache and has not been able to see out of her eye, blurred vision after cataract surgery.  PMH: type 2 diabetes, morbid obesity, obesity hypoventilation syndrome, OSA wears BiPAP at night, hypertension, asthma, GERD, hyperlipidemia, restless leg syndrome, left BKA, R transmetatarsal amputation, mood disorder, hypertension, history of chest pain.   OT comments  Pt being assisted by nursing staff for clean up after bed became wet. Encouraged pt to participate in UB bathing, pt deferred to NT. Pt donned gown with set up. Total assist for pericare in sidelying. Declined donning prosthesis and transferring OOB for linen change. Asked pt her goals as far as ADL, pt identified being able to toilet independently. Will educate in compensatory strategies next visit. Pt likely to continue to rely on her 8 hour a day aide.   Recommendations for follow up therapy are one component of a multi-disciplinary discharge planning process, led by the attending physician.  Recommendations may be updated based on patient status, additional functional criteria and insurance authorization.    Follow Up Recommendations  No OT follow up    Assistance Recommended at Discharge Frequent or constant Supervision/Assistance  Patient can return home with the following  A little help with walking and/or transfers;A lot of help with bathing/dressing/bathroom;Assist for transportation;Help with stairs or ramp for entrance   Equipment Recommendations  None recommended by OT    Recommendations for Other Services      Precautions / Restrictions Precautions Precautions: Fall Precaution Comments: RLE transmetatarsal amputation Required Braces or Orthoses:  Other Brace Other Brace: L prosthetic Restrictions Weight Bearing Restrictions: No       Mobility Bed Mobility Overal bed mobility: Needs Assistance Bed Mobility: Sit to Supine;Rolling Rolling: Min assist    Sit to supine: Supervision   General bed mobility comments: requesting assist to roll from supine to R side for pericare    Transfers          General transfer comment: declined OOB or standing during linen change or pericare     Balance Overall balance assessment: Needs assistance Sitting-balance support: No upper extremity supported;Feet supported Sitting balance-Leahy Scale: Good Sitting balance - Comments: pt sitting EOB upon therapist's arrival and at end of session   Standing balance support: Bilateral upper extremity supported (RW) Standing balance-Leahy Scale: Poor Standing balance comment: pt requires min guard/min A for standing balance                           ADL either performed or assessed with clinical judgement   ADL Overall ADL's : Needs assistance/impaired                                       General ADL Comments: total assist for sponge bathing at EOB and rolling at bed level for pericare, pt not participating, despite encouragement, set up to don gown    Extremity/Trunk Assessment     Lower Extremity Assessment Lower Extremity Assessment: Generalized weakness   Cervical / Trunk Assessment Cervical / Trunk Assessment: Normal;Other exceptions Cervical / Trunk Exceptions: body habitus    Vision       Perception  Praxis      Cognition Arousal/Alertness: Awake/alert Behavior During Therapy: WFL for tasks assessed/performed Overall Cognitive Status: Within Functional Limits for tasks assessed                                            Exercises     Shoulder Instructions       General Comments      Pertinent Vitals/ Pain       Pain Assessment: Faces Pain Score: 8  Faces  Pain Scale: No hurt Pain Location: R foot Pain Descriptors / Indicators: Aching;Discomfort;Grimacing;Nagging Pain Intervention(s): Limited activity within patient's tolerance;Monitored during session;Premedicated before session;Repositioned  Home Living                                          Prior Functioning/Environment              Frequency  Min 2X/week        Progress Toward Goals  OT Goals(current goals can now be found in the care plan section)  Progress towards OT goals: Not progressing toward goals - comment  Acute Rehab OT Goals OT Goal Formulation: With patient Time For Goal Achievement: 01/02/22 Potential to Achieve Goals: [place] Discharge plan remains appropriate    Co-evaluation                 AM-PAC OT "6 Clicks" Daily Activity     Outcome Measure   Help from another person eating meals?: None Help from another person taking care of personal grooming?: A Little Help from another person toileting, which includes using toliet, bedpan, or urinal?: Total Help from another person bathing (including washing, rinsing, drying)?: Total Help from another person to put on and taking off regular upper body clothing?: A Little   6 Click Score: 12    End of Session    OT Visit Diagnosis: Muscle weakness (generalized) (M62.81);Pain   Activity Tolerance Patient tolerated treatment well   Patient Left in bed;with nursing/sitter in room   Nurse Communication          Time: 1308-6578 OT Time Calculation (min): 16 min  Charges: OT General Charges $OT Visit: 1 Visit OT Treatments $Self Care/Home Management : 8-22 mins  Nestor Lewandowsky, OTR/L Acute Rehabilitation Services Pager: [phone] Office: [phone]   Malka So 12/21/2021, 12:53 PM

## 2021-12-21 NOTE — Hospital Course (Signed)
[name] is a 46 year old female presenting with foot pain found to be in hyperglycemic crisis.  PMH significant for T2DM, morbid obesity, obesity hypoventilation syndrome, OSA wears BiPAP at night, HTN, asthma, GERD, hyperlipidemia, restless leg syndrome, left BKA, mood disorder, history of chest pain with low risk for cardiac etiology.   Right foot ulcer Nonhealing ulcer on right transmetatarsal amputation site. Antibiotics of vancomycin and Zosyn were started and has transitioned to Augmentin on 1/15.  Right foot x-ray showed peripheral soft tissue swelling and stump ulceration without definite evidence of osteomyelitis. R foot MRI showed mild bone marrow edema and enhancement that is suspicious for early acute osteomyelitis. Orthopedic surgery was consulted and did not advise surgical intervention. Was discharged on augmentin twice daily for 4 weeks  for concern of osteomyelitis. Patient remained hemodynamically stable and afebrile. PT recommended CIR but patient requested going home.     HHS   hyperglycemic crisis   poorly controlled T2DM Glucose of 904 on admission.  Anion gap was to 15.  Had venous blood gas showed pH of 7.411, pCO2 42.9, pO2 37, bicarb 27.2. Met criteria for HHS and was made NPO while on insulin drip. Resolved at end of hospitalization and was transition to long acting with rSSI.    R Ireitis Eye still red on examination, continued atropine 1% 1 drop daily and prednisolone 1 % 1 drop q2h while awake from ophthalmologist.  AKI on CKD3a  Creatinine of 1.76 in ED. Most recent values around 1.3-1.4. Was resolved with mIVF.  Chronic conditions remained stable

## 2021-12-21 NOTE — Progress Notes (Signed)
°  Inpatient Rehab Admissions Coordinator :  Per therapy recommendations, patient was screened for CIR candidacy by Danne Baxter RN MSN.  At this time patient appears to be a potential candidate for CIR. I will contact acute team  to place a rehab consult per protocol for full assessment. Please call me with any questions.  Danne Baxter RN MSN Admissions Coordinator [phone]

## 2021-12-21 NOTE — Progress Notes (Signed)
Family Medicine Teaching Service Daily Progress Note Intern Pager: [phone]  Patient name: [name] Medical record number: [id number] Date of birth: September 26, 1976 Age: 46 y.o. Gender: female  Primary Care Provider: [name], MD Consultants: Orthopedics Code Status: Full  Pt Overview and Major Events to Date:  1/14 admitted  Assessment and Plan:  [name] is a 46 year old female presenting with foot pain found to be in hyperglycemic crisis.  PMH significant for T2DM, morbid obesity, obesity hypoventilation syndrome, OSA wears BiPAP at night, HTN, asthma, GERD, hyperlipidemia, restless leg syndrome, left BKA, mood disorder, history of chest pain with low risk for cardiac etiology.  Right foot ulcer Nonhealing ulcer on right transmetatarsal amputation site. Antibiotics of vancomycin and Zosyn were started and has transitioned to Augmentin on 1/15. Has remained afebrile. -Orthopedics signed off today -Augmentin twice daily likely will need longer course for concern of osteomyelitis (4 weeks) -Monitor fever curve -Tylenol 650 mg q6h prn -PT/OT  HHS   hyperglycemic crisis   poorly controlled T2DM CBGs have been 364-556.  Received 45 units basal and 55 units short-acting yesterday.  -Increase semglee if staying today -consider mealtime coverage if staying today -rSSI -nightly coverage -CBGs 4 times daily  R Ireitis Eye still red on examination -atropine 1% 1 drop daily -prednisolone 1 % 1 drop q2h while awake  AKI on CKD3a, resolved -Encourage p.o. intake -Avoid nephrotoxic agents -Monitor on a.m. BMP  HTN Blood pressures have been 10 9-1 60 systolics over 69G 29B diastolic. -monitor  HFpEF   history of chest pain/angina pectoralis Patient prescribed torsemide 80 mg daily.  AKI has resolved -Consider restart torsemide   Normocytic Anemia Hg 10.5 from 12.0 yesterday. Wound has been leaking per patient as before.  -Monitor on CBC  Chronic and stable  conditions Vitamin D deficiency RLS Insomnia-ramelteon 8 mg at bedtime GERD IDA Asthma/obesity hypoventilation syndrome/OSA Chronic pain-Percocet 7.5 every 8 hours as needed  FEN/GI: NPO for possible procedure  PPx: lovenox Dispo:PT recommended CIR  Subjective:  Denies any pain, foot is wrapped up currently. No acute issues overnight  Objective: Temp:  [97.4 F (36.3 C)-98.4 F (36.9 C)] 98.4 F (36.9 C) (01/16 0635) Pulse Rate:  [77-90] 77 (01/16 0635) Resp:  [15-20] 17 (01/16 0635) BP: (109-160)/(47-68) 109/47 (01/16 0635) SpO2:  [83 %-97 %] 97 % (01/16 2841) Physical Exam: General: NAD, non-toxic, sitting on side of bed  Cardiovascular: RRR no m/r/g Respiratory: CTAB no w/r/c Abdomen: soft, obese habitus, nontender to palpation Extremities: R leg in compression stocking and sock, no leakage of wound through sock. L BKA  Laboratory: Recent Labs  Lab 12/18/21 2328 12/19/21 0810 12/19/21 0811 12/20/21 0238 12/21/21 0157  WBC 10.9*  --   --  10.9* 9.7  HGB 11.9*   < > 14.6 12.0 10.5*  HCT 37.6   < > 43.0 36.6 32.7*  PLT 353  --   --  362 354   < > = values in this interval not displayed.   Recent Labs  Lab 12/18/21 2328 12/19/21 0810 12/19/21 0811 12/20/21 0238 12/21/21 0157  NA 121*   < > 121* 128* 131*  K 4.2   < > 4.4 4.1 3.9  CL 81*  --  87* 92* 96*  CO2 25  --   --  24 24  BUN 53*  --  69* 48* 38*  CREATININE 1.76*  --  1.40* 1.62* 1.18*  CALCIUM 8.7*  --   --  8.9 8.7*  PROT 7.9  --   --   --   --  BILITOT 0.3  --   --   --   --   ALKPHOS 200*  --   --   --   --   ALT 18  --   --   --   --   AST 16  --   --   --   --   GLUCOSE 904*  --  >700* 556* 364*   < > = values in this interval not displayed.    Imaging/Diagnostic Tests:   Gerrit Heck, MD 12/21/2021, 8:54 AM PGY-1, [place] Intern pager: [phone], text pages welcome

## 2021-12-21 NOTE — Discharge Instructions (Addendum)
Dear Baker Pierini,   Thank you so much for allowing [place] to be part of your care!  You were admitted to [facility] for your foot ulcer concerning for a bone infection and elevated glucoses. We started you on an antibiotic and we helped your glucose levels decrease.   POST-HOSPITAL & CARE INSTRUCTIONS Please follow up with orthopedic doctor about your foot ulcer to ensure that it is healing well. We have prescribed you a 4 week course of an antibiotic (augmentin) because some of your imaging suggests possible early bone infection Please follow up with endocrinology for your diabetes management Please let PCP/Specialists know of any changes that were made.  Please see medications section of this packet for any medication changes.   DOCTOR'S APPOINTMENT & FOLLOW UP CARE INSTRUCTIONS  Future Appointments  Date Time Provider [place]  02/15/2022 11:00 AM [place], Jinny Blossom, MD [facility] CPR    RETURN PRECAUTIONS: Please return to care if you have worsening foot ulcer with fevers  Please return to care if your glucose levels are elevated and feel sick with vomiting or confusion  Take care and be well!  [facility]  [place], [place] [address] [phone]

## 2021-12-21 NOTE — Progress Notes (Addendum)
Inpatient Rehabilitation Admissions Coordinator   I met with patient at bedside. She is familiar with CIR from previous admit after her L BKA. She does not want CIR admit. She prefers direct discharge home with her 44 year old daughter and CAPS aide. We will not pursue CIR admit. I will alert acute team and TOC.  Danne Baxter, RN, MSN Rehab Admissions Coordinator [phone] 12/21/2021 3:05 PM

## 2021-12-21 NOTE — Progress Notes (Signed)
CBG-479, MD Jagadish notified. Verbal order for 25units Novolog received. Administered 25 units of Novolog. Patient shows no signs of distress, resting comfortably on the edge of the bed. Will continue to monitor.

## 2021-12-22 LAB — HEMOGLOBIN A1C
Hgb A1c MFr Bld: >15.5 % — ABNORMAL HIGH (ref 4.8–5.6)
Mean Plasma Glucose: >398 mg/dL

## 2021-12-23 ENCOUNTER — Telehealth: Payer: Self-pay | Admitting: Family Medicine

## 2021-12-23 NOTE — Telephone Encounter (Signed)
After-hours call Patient called the after-hours call line regarding worsening pain of her right lower extremity.  She reports that she was recently hospitalized and had infection in her foot and was discharged on Augmentin twice daily for 4 weeks.  She reports she has been taking it as prescribed.  She reports that over the last day the pain has been considerably worse.  She denies any new discharge, swelling, fever.  She is wondering if she needs to be reevaluated in the emergency department.  We discussed her being evaluated in the ED versus an appointment and she has elected to go to the emergency department.  She may not need to be admitted but she does we will gladly do so.

## 2021-12-27 IMAGING — CR DG CHEST 2V
2 series · 2 of 2 positions shown · non-contrast
Comparison: Chest radiograph and CTA 11/07/2020

CLINICAL DATA: Intermittent chest pressure for 1 month.

EXAM:
CHEST - 2 VIEW

[chest lat]
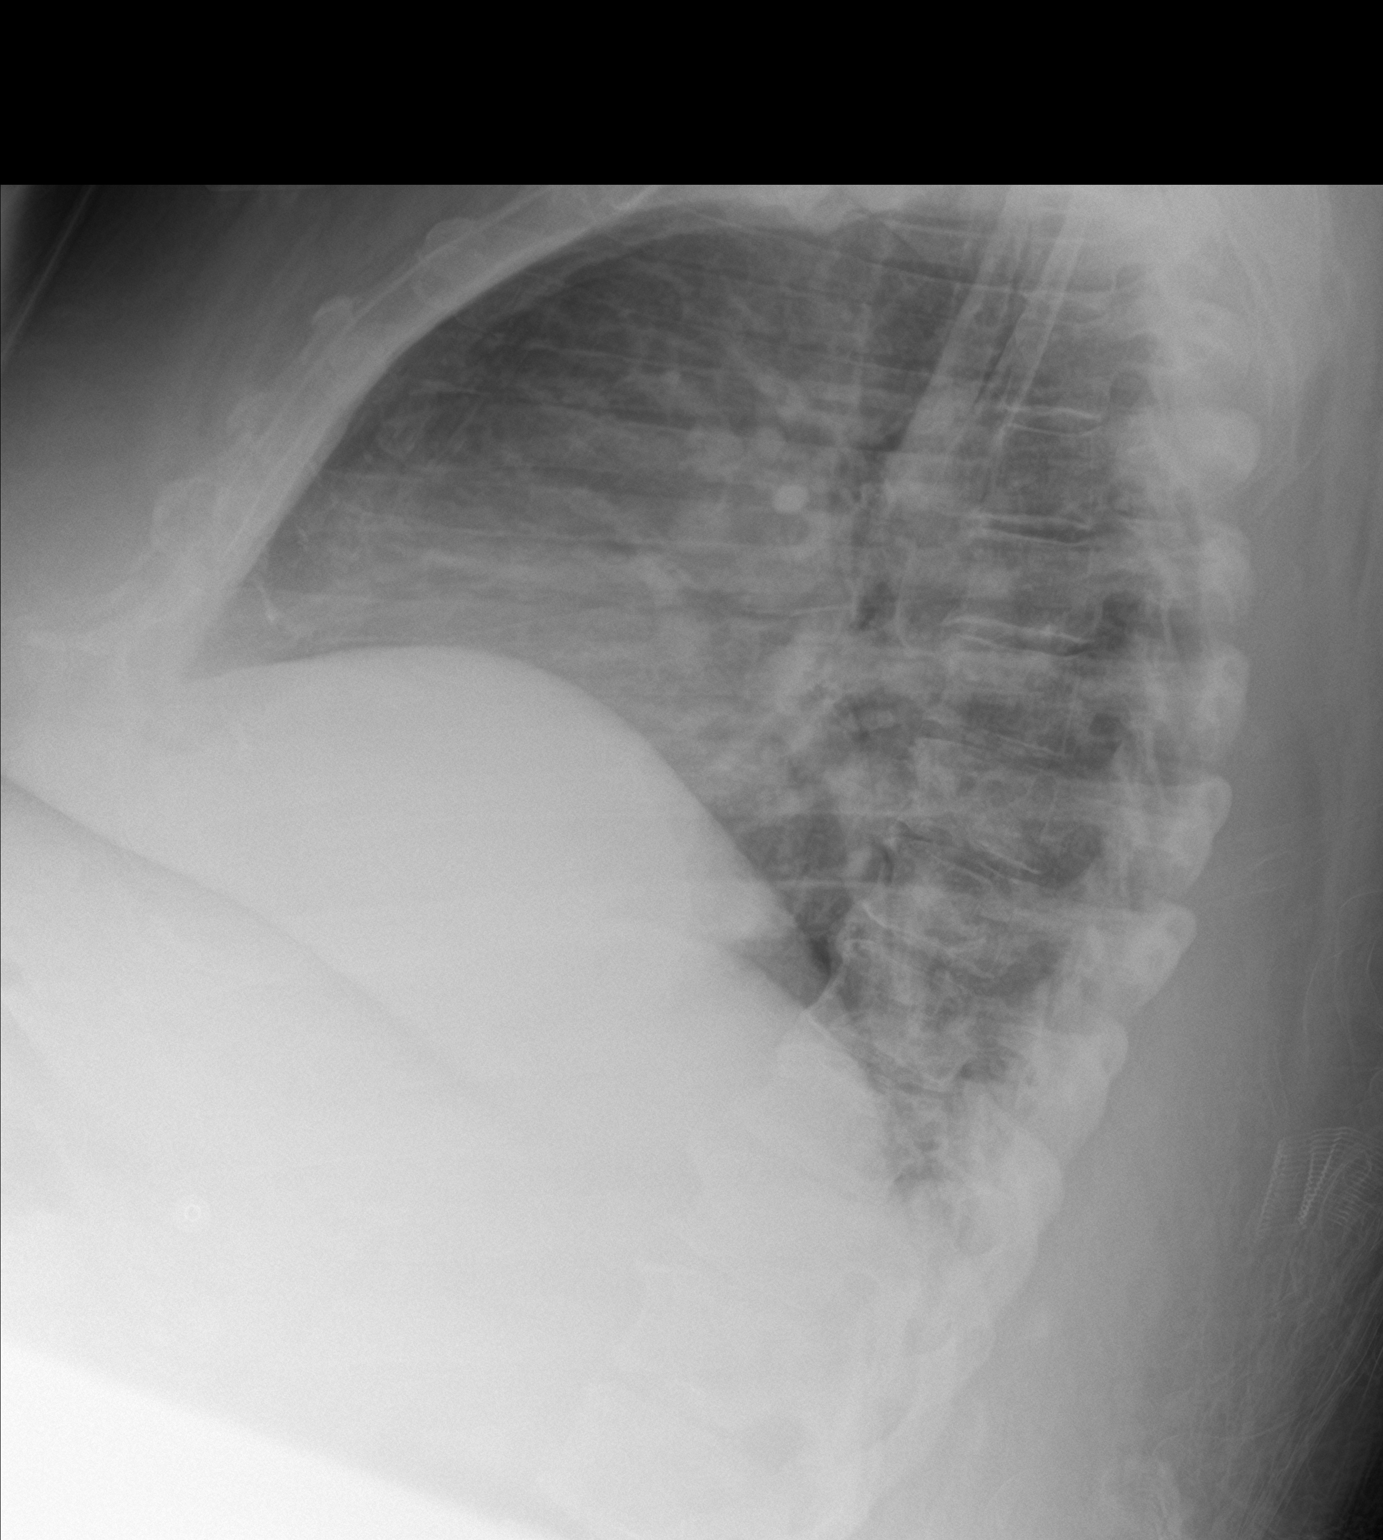

[chest ap]
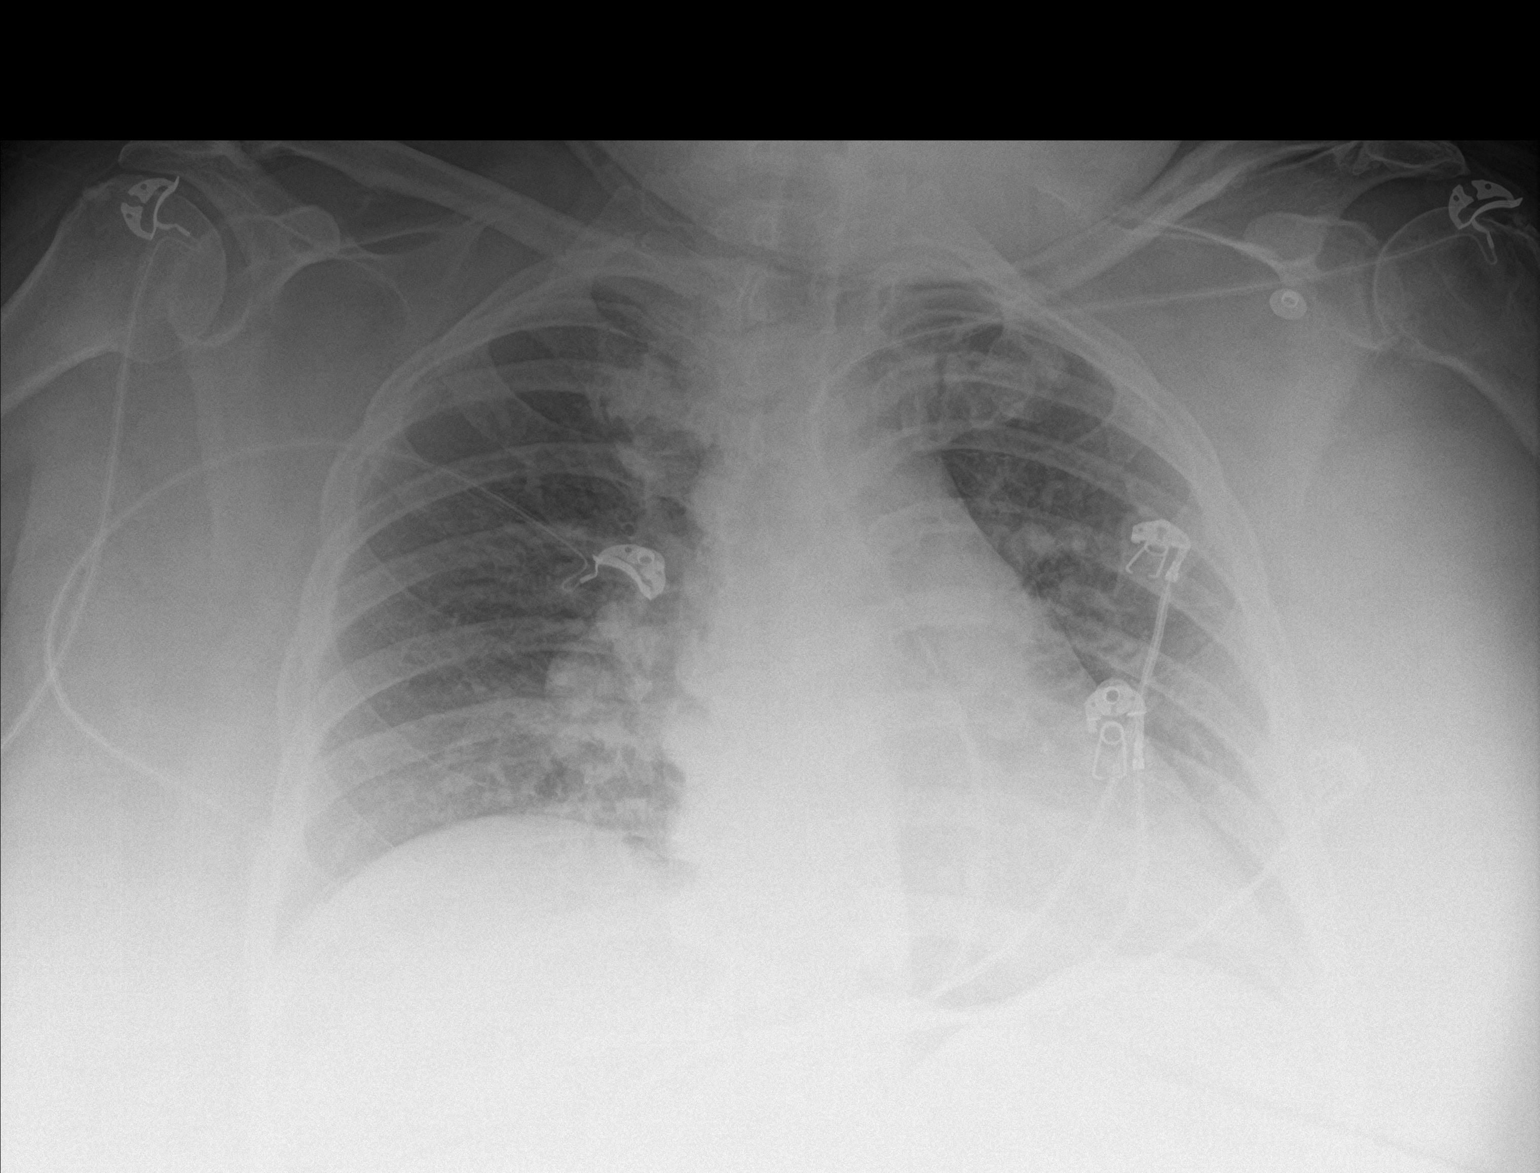

[2 of 2 positions shown; findings below may reference images not displayed]

FINDINGS: The cardiomediastinal silhouette is unchanged with normal heart
size. The lungs are mildly hypoinflated with persistent mild
elevation of the right hemidiaphragm and mild right basilar
atelectasis. No edema, pleural effusion, or pneumothorax is
identified. No acute osseous abnormality is seen.
IMPRESSION: Mild right basilar atelectasis.

## 2021-12-28 ENCOUNTER — Inpatient Hospital Stay: Payer: Medicaid Other | Admitting: Family Medicine

## 2021-12-29 ENCOUNTER — Inpatient Hospital Stay: Payer: Medicaid Other | Admitting: Student

## 2022-01-04 ENCOUNTER — Other Ambulatory Visit (HOSPITAL_COMMUNITY): Payer: Self-pay

## 2022-01-04 ENCOUNTER — Telehealth: Payer: Self-pay

## 2022-01-04 ENCOUNTER — Other Ambulatory Visit: Payer: Self-pay | Admitting: Family Medicine

## 2022-01-04 NOTE — Telephone Encounter (Signed)
Completed PA info in [place] for oxycodone-acetaminophen.  Status pending.  Will recheck status in 24 hours. Talbot Grumbling, RN

## 2022-01-04 NOTE — Telephone Encounter (Signed)
Prior approval for oxycodone-acetaminophen completed via [place] [employer].  Med approved for 01/04/2022 - 07/03/2022  Prior approval # [id number].  [facility] informed.  Talbot Grumbling, RN

## 2022-01-07 ENCOUNTER — Other Ambulatory Visit: Payer: Self-pay

## 2022-01-07 ENCOUNTER — Ambulatory Visit (INDEPENDENT_AMBULATORY_CARE_PROVIDER_SITE_OTHER): Payer: Medicaid Other | Admitting: Student

## 2022-01-07 ENCOUNTER — Encounter: Payer: Self-pay | Admitting: Student

## 2022-01-07 DIAGNOSIS — E08621 Diabetes mellitus due to underlying condition with foot ulcer: Secondary | ICD-10-CM

## 2022-01-07 DIAGNOSIS — E278 Other specified disorders of adrenal gland: Secondary | ICD-10-CM

## 2022-01-07 DIAGNOSIS — L97416 Non-pressure chronic ulcer of right heel and midfoot with bone involvement without evidence of necrosis: Secondary | ICD-10-CM

## 2022-01-07 DIAGNOSIS — E1149 Type 2 diabetes mellitus with other diabetic neurological complication: Secondary | ICD-10-CM

## 2022-01-07 NOTE — Assessment & Plan Note (Signed)
S/p surgical debridement. Currently receiving IV vancomycin, levofloxacin, and micafungin via PICC line. Has close follow-up with wound care next Monday and ID next Tuesday. No fevers or evidence of evolving infection of extremity.

## 2022-01-07 NOTE — Progress Notes (Signed)
SUBJECTIVE:   CHIEF COMPLAINT / HPI:   Hospital Follow-up for R foot ulcer Patient recently hospitalized on our service for a non-healing diabetic foot ulcer on R side. MRI raised suspicion for early acute osteomyelitis and patient was discharged with BID Augmentin for 4 weeks. She was subsequently admitted to [facility] from 1/19-1/28 and underwent surgical debridement, was discharged on vancomycin, levofloxacin, and micafungin for 6 weeks. Discharge instructions from [facility] include the following PCP follow-up instructions: You should see your regular doctor or provider within 2 weeks of discharge from the hospital to review your blood sugar control.   Make sure your doctor knows that this was found while you were in the hospital and need to have the following tests checked: # incidental right adrenal nodule - needs CT adrenal protocol outpatient - needs 1 mg dex suppression test vs salivary cortisol, renin/aldo, and plasma metanephrines outpatient She is following up with ID next Tuesday. Has had no complications with PICC.   T2DM During the admission to our service, patient was found to be in HHS on admission with glucose to 904 and anion gap to 15. Home meds included Tresiba 37J BID and Trulicity 1.5mg  weekly.  A1c was >15.5. While she was at [facility], her regimen was adjusted to: 30u Tresiba BID, 30u Novolog with each meal. Sliding scale with meals as well. Trulicity increased to 3.0mg  weekly. In the days since discharge, she has been regularly checking her sugars with good result. She has had two readings in the 240s, remainder below 200. None below 130.    OBJECTIVE:   BP 108/67    Pulse (!) 101    SpO2 93%   Physical Exam Vitals reviewed.  Constitutional:      General: She is not in acute distress.    Appearance: She is obese.  Cardiovascular:     Rate and Rhythm: Normal rate and regular rhythm.     Pulses: Normal pulses.     Heart sounds: Normal heart sounds.  Pulmonary:     Effort:  Pulmonary effort is normal.     Breath sounds: Normal breath sounds.  Musculoskeletal:     Comments: S/p L BKA, prosthetic in place Surgical wound not observed as patient was given instructions to leave bandage on until wound care follow up on 2/6. Bandage is clean and dry with no warmth, erythema, or streaking extending proximally.      ASSESSMENT/PLAN:   Diabetic ulcer of foot associated with diabetes mellitus due to underlying condition, with other ulcer severity, unspecified laterality, unspecified part of foot ([place]) S/p surgical debridement. Currently receiving IV vancomycin, levofloxacin, and micafungin via PICC line. Has close follow-up with wound care next Monday and ID next Tuesday. No fevers or evidence of evolving infection of extremity.   Type 2 diabetes mellitus with neurological complications [facility]) Patient reported blood sugars are much improved on her new regimen. I will adjust medication doses in the EMR so that our records reflect what she is actually taking.  - Repeat A1c in 3 months  Adrenal nodule ([name]) Right adrenal nodule measuring 3.2 x 2.2 cm. Will initiate further workup with a CT Adrenal Protocol and am plasma cortisol. Last serum Cr 1.18. - Patient to return for am cortisol draw sometime next week - Order placed for CT Abd w wo contrast with adrenal protocol - If abnormal findings, will pursue further to endocrinology for further lab evaluation        Pearla Dubonnet, MD [facility]

## 2022-01-07 NOTE — Assessment & Plan Note (Signed)
>>  ASSESSMENT AND PLAN FOR INSULIN DEPENDENT TYPE 2 DIABETES MELLITUS ([place]) WRITTEN ON 01/07/2022  2:31 PM BY Jim Like B, MD  Patient reported blood sugars are much improved on her new regimen. I will adjust medication doses in the EMR so that our records reflect what she is actually taking.  - Repeat A1c in 3 months

## 2022-01-07 NOTE — Patient Instructions (Signed)
Ms. Cafarella, It is such a joy to take care you! Thank you for coming in today.   As a reminder, here is a recap of what we talked about today:  - I am sorry for all you've had going on with your foot! I am ordering the imaging study and labwork recommended by your physicians at [facility]. We will help you schedule the CT scan. For the bloodwork, you can stop by the clinic any morning in the next week or two and have that drawn. Try to get here as close to 8:30 as you can. If you call in to the clinic, we will try to come out to your car to draw those labs. - Congratulations on the good success you've had with your blood sugar control! This is one of the best things you can do for your long-term health.   Take care and seek immediate care sooner if you develop any concerns.   Marnee Guarneri, MD [place]

## 2022-01-07 NOTE — Assessment & Plan Note (Addendum)
Right adrenal nodule measuring 3.2 x 2.2 cm. Will initiate further workup with a CT Adrenal Protocol and am plasma cortisol. Last serum Cr 1.18. - Patient to return for am cortisol draw sometime next week - Order placed for CT Abd w wo contrast with adrenal protocol - If abnormal findings, will pursue further to endocrinology for further lab evaluation

## 2022-01-07 NOTE — Assessment & Plan Note (Signed)
Patient reported blood sugars are much improved on her new regimen. I will adjust medication doses in the EMR so that our records reflect what she is actually taking.  - Repeat A1c in 3 months

## 2022-01-13 IMAGING — CR DG TIBIA/FIBULA 2V*R*
4 series · 4 of 4 positions shown · non-contrast
Comparison: None.

CLINICAL DATA: Cellulitis

EXAM:
RIGHT TIBIA AND FIBULA - 2 VIEW

[tibia ap (1 of 2)]
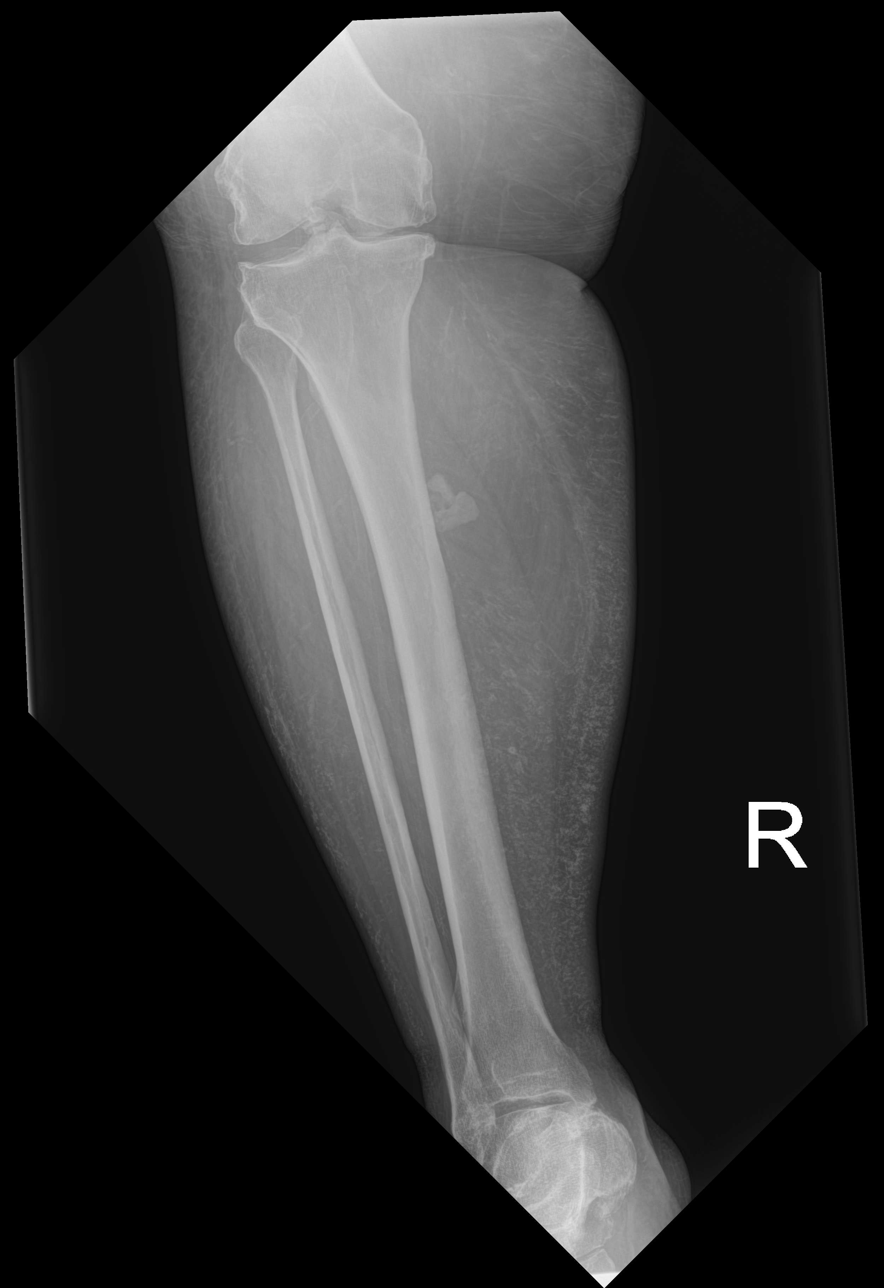

[tibia ap (2 of 2)]
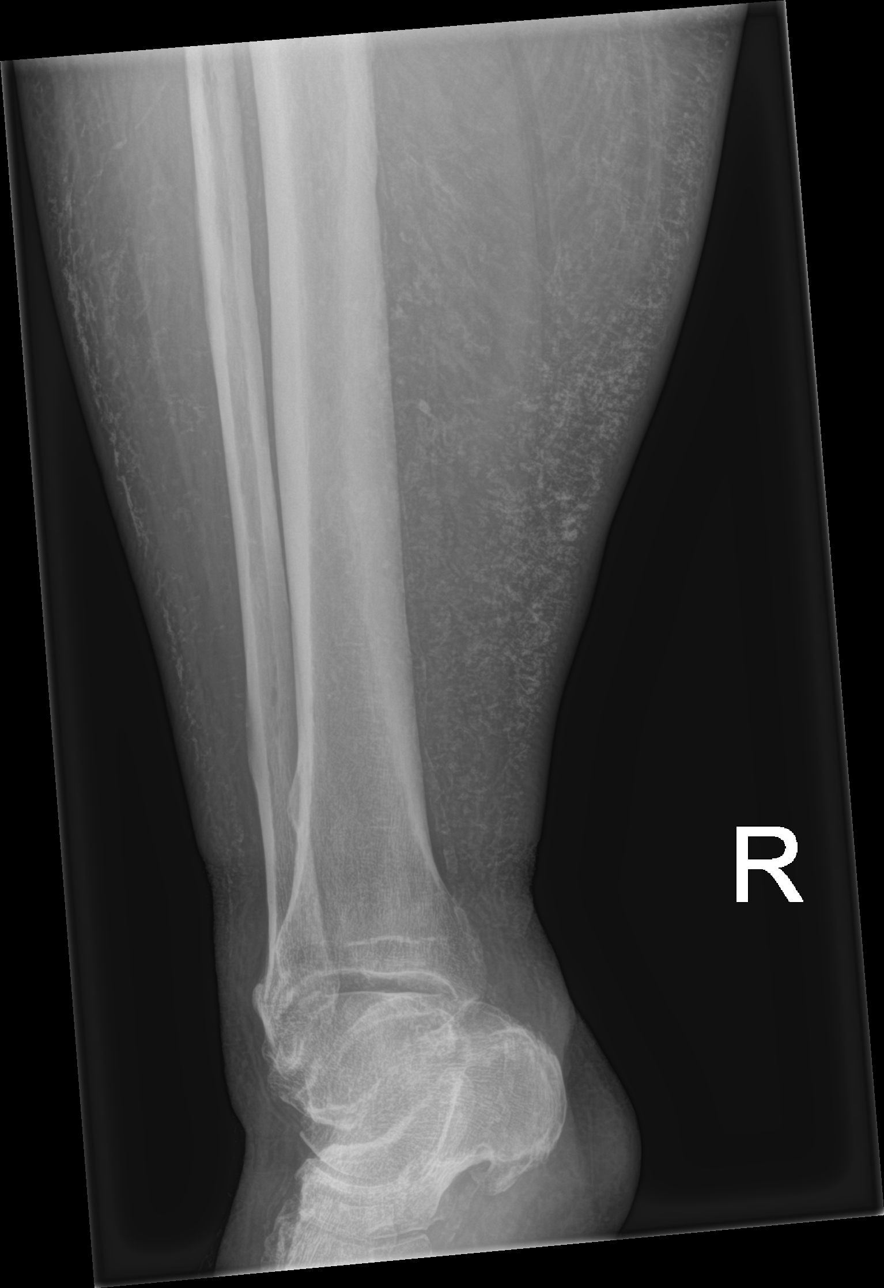

[tibia lat (1 of 2)]
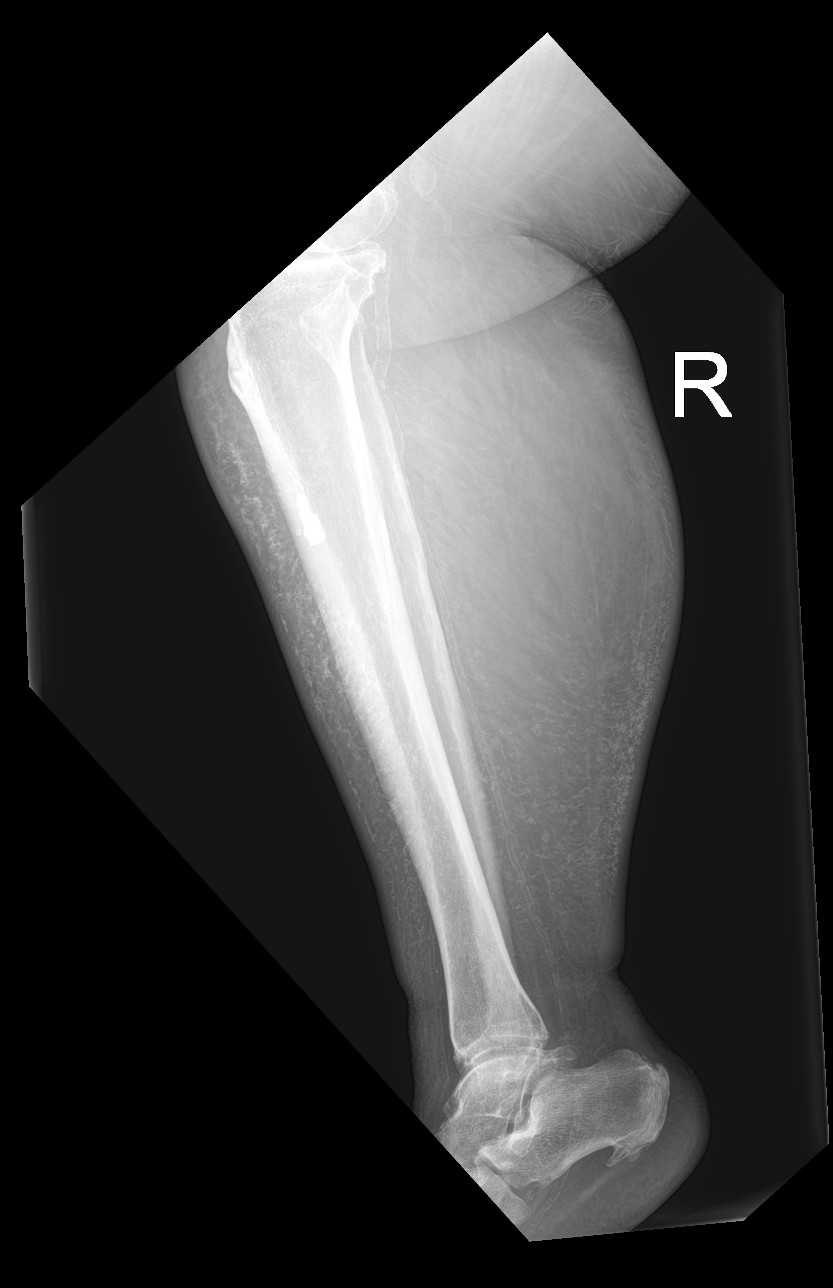

[tibia lat (2 of 2)]
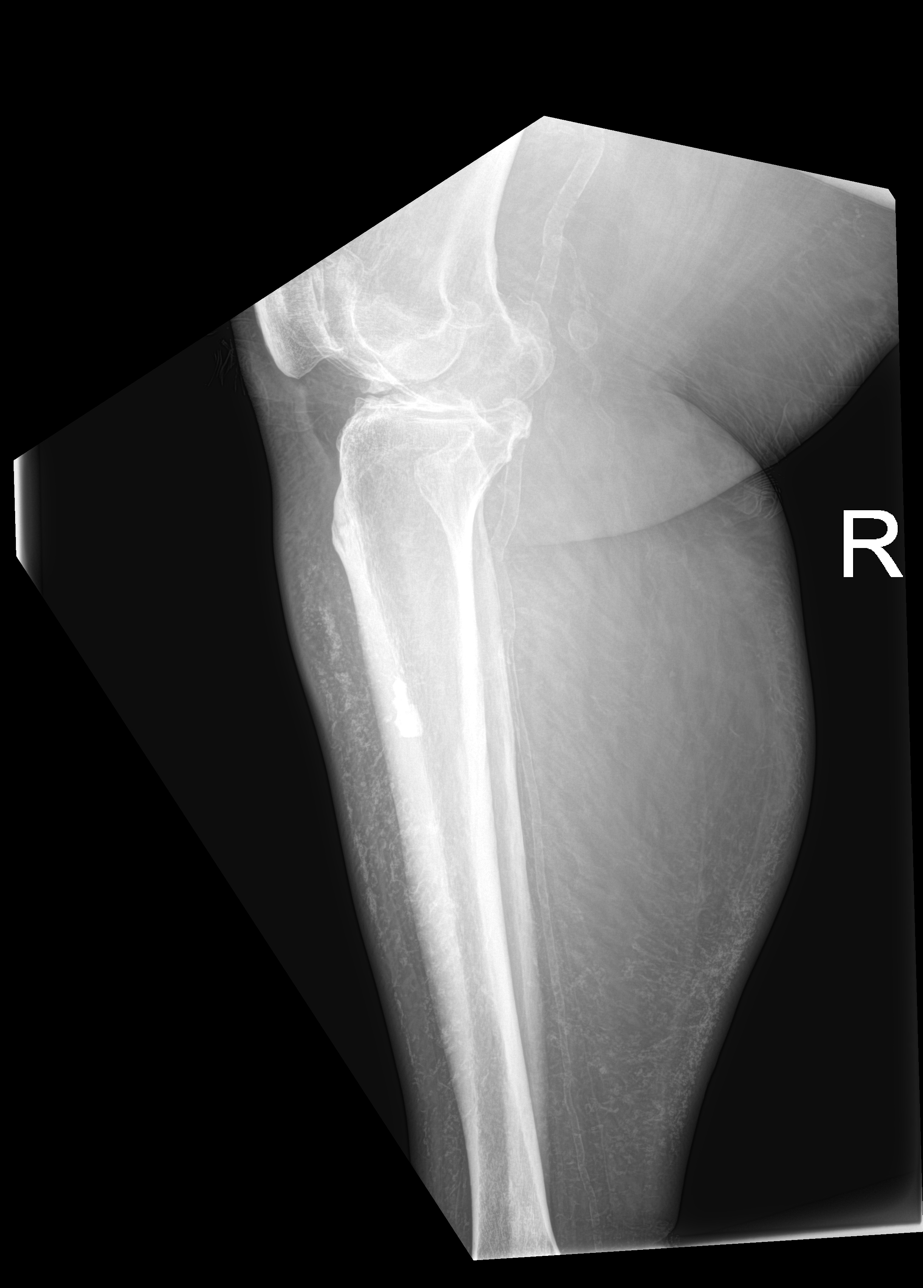

[4 of 4 positions shown; findings below may reference images not displayed]

FINDINGS: There is a cortical step-off seen at the distal fibular tip. There
is diffuse subcutaneous edema and superficial calcifications seen
surrounding the lower extremity. Dense vascular calcifications are
noted.
IMPRESSION: Cortical step-off seen at the distal fibular tip which could
represent an age indeterminate fracture.

Diffuse soft tissue swelling seen surrounding the lower extremity.

## 2022-01-18 ENCOUNTER — Other Ambulatory Visit (HOSPITAL_COMMUNITY): Payer: Self-pay

## 2022-01-21 ENCOUNTER — Other Ambulatory Visit (HOSPITAL_COMMUNITY): Payer: Self-pay

## 2022-01-21 ENCOUNTER — Telehealth: Payer: Self-pay

## 2022-01-21 NOTE — Telephone Encounter (Signed)
A Prior Authorization was initiated for this patients ROZEREM ([place]) through [employer].  Confirmation #: [id number]

## 2022-01-22 ENCOUNTER — Other Ambulatory Visit (HOSPITAL_COMMUNITY): Payer: Self-pay

## 2022-01-26 ENCOUNTER — Other Ambulatory Visit (HOSPITAL_COMMUNITY): Payer: Self-pay

## 2022-02-01 ENCOUNTER — Other Ambulatory Visit: Payer: Self-pay

## 2022-02-01 NOTE — Telephone Encounter (Signed)
Prior Auth for patients medication ROZEREM approved by [place] [employer] from 01/21/22 to 02/20/22.            Approved for 15 tablets for 30 days.       Patients pharmacy notified but claim still rejecting for brand and generic with 30 day supply. Will f/u with [place] [facility] again.

## 2022-02-02 ENCOUNTER — Other Ambulatory Visit (HOSPITAL_COMMUNITY): Payer: Self-pay

## 2022-02-03 MED ORDER — OXYCODONE-ACETAMINOPHEN 7.5-325 MG PO TABS: 1.0000 | ORAL_TABLET | Freq: Three times a day (TID) | ORAL | 0 refills | Status: DC | PRN

## 2022-02-03 NOTE — Telephone Encounter (Signed)
Patient returns call to nurse line regarding status of rx refill.  ? ?Please advise.  ? ?Talbot Grumbling, RN ? ?

## 2022-02-05 ENCOUNTER — Inpatient Hospital Stay (HOSPITAL_COMMUNITY)
Admission: EM | Admit: 2022-02-05 | Discharge: 2022-02-07 | DRG: 291 | Disposition: A | Discharge status: Home / Self Care | Payer: Medicaid Other | Attending: Student | Admitting: Student

## 2022-02-05 ENCOUNTER — Encounter (HOSPITAL_COMMUNITY): Payer: Self-pay

## 2022-02-05 ENCOUNTER — Emergency Department (HOSPITAL_COMMUNITY): Payer: Medicaid Other

## 2022-02-05 ENCOUNTER — Inpatient Hospital Stay (HOSPITAL_COMMUNITY): Payer: Medicaid Other

## 2022-02-05 ENCOUNTER — Other Ambulatory Visit: Payer: Self-pay

## 2022-02-05 DIAGNOSIS — Z825 Family history of asthma and other chronic lower respiratory diseases: Secondary | ICD-10-CM

## 2022-02-05 DIAGNOSIS — Z7985 Long-term (current) use of injectable non-insulin antidiabetic drugs: Secondary | ICD-10-CM

## 2022-02-05 DIAGNOSIS — M25562 Pain in left knee: Secondary | ICD-10-CM | POA: Diagnosis present

## 2022-02-05 DIAGNOSIS — Z8249 Family history of ischemic heart disease and other diseases of the circulatory system: Secondary | ICD-10-CM

## 2022-02-05 DIAGNOSIS — Z6841 Body Mass Index (BMI) 40.0 and over, adult: Secondary | ICD-10-CM

## 2022-02-05 DIAGNOSIS — I130 Hypertensive heart and chronic kidney disease with heart failure and stage 1 through stage 4 chronic kidney disease, or unspecified chronic kidney disease: Principal | ICD-10-CM | POA: Diagnosis present

## 2022-02-05 DIAGNOSIS — Z7982 Long term (current) use of aspirin: Secondary | ICD-10-CM

## 2022-02-05 DIAGNOSIS — E1151 Type 2 diabetes mellitus with diabetic peripheral angiopathy without gangrene: Secondary | ICD-10-CM | POA: Diagnosis present

## 2022-02-05 DIAGNOSIS — K219 Gastro-esophageal reflux disease without esophagitis: Secondary | ICD-10-CM | POA: Diagnosis present

## 2022-02-05 DIAGNOSIS — Z89512 Acquired absence of left leg below knee: Secondary | ICD-10-CM

## 2022-02-05 DIAGNOSIS — Z83438 Family history of other disorder of lipoprotein metabolism and other lipidemia: Secondary | ICD-10-CM

## 2022-02-05 DIAGNOSIS — E1142 Type 2 diabetes mellitus with diabetic polyneuropathy: Secondary | ICD-10-CM | POA: Diagnosis present

## 2022-02-05 DIAGNOSIS — Z881 Allergy status to other antibiotic agents status: Secondary | ICD-10-CM | POA: Diagnosis not present

## 2022-02-05 DIAGNOSIS — Z452 Encounter for adjustment and management of vascular access device: Secondary | ICD-10-CM

## 2022-02-05 DIAGNOSIS — J449 Chronic obstructive pulmonary disease, unspecified: Secondary | ICD-10-CM | POA: Diagnosis present

## 2022-02-05 DIAGNOSIS — E1165 Type 2 diabetes mellitus with hyperglycemia: Secondary | ICD-10-CM | POA: Diagnosis present

## 2022-02-05 DIAGNOSIS — E11621 Type 2 diabetes mellitus with foot ulcer: Secondary | ICD-10-CM | POA: Diagnosis not present

## 2022-02-05 DIAGNOSIS — M86171 Other acute osteomyelitis, right ankle and foot: Secondary | ICD-10-CM | POA: Diagnosis present

## 2022-02-05 DIAGNOSIS — D72829 Elevated white blood cell count, unspecified: Secondary | ICD-10-CM | POA: Diagnosis not present

## 2022-02-05 DIAGNOSIS — I5033 Acute on chronic diastolic (congestive) heart failure: Secondary | ICD-10-CM | POA: Diagnosis present

## 2022-02-05 DIAGNOSIS — R5381 Other malaise: Secondary | ICD-10-CM | POA: Diagnosis not present

## 2022-02-05 DIAGNOSIS — E785 Hyperlipidemia, unspecified: Secondary | ICD-10-CM | POA: Diagnosis present

## 2022-02-05 DIAGNOSIS — Z888 Allergy status to other drugs, medicaments and biological substances status: Secondary | ICD-10-CM | POA: Diagnosis not present

## 2022-02-05 DIAGNOSIS — G4733 Obstructive sleep apnea (adult) (pediatric): Secondary | ICD-10-CM | POA: Diagnosis not present

## 2022-02-05 DIAGNOSIS — Z20822 Contact with and (suspected) exposure to covid-19: Secondary | ICD-10-CM | POA: Diagnosis present

## 2022-02-05 DIAGNOSIS — J45909 Unspecified asthma, uncomplicated: Secondary | ICD-10-CM

## 2022-02-05 DIAGNOSIS — E119 Type 2 diabetes mellitus without complications: Secondary | ICD-10-CM | POA: Diagnosis not present

## 2022-02-05 DIAGNOSIS — Z87891 Personal history of nicotine dependence: Secondary | ICD-10-CM | POA: Diagnosis not present

## 2022-02-05 DIAGNOSIS — L97519 Non-pressure chronic ulcer of other part of right foot with unspecified severity: Secondary | ICD-10-CM

## 2022-02-05 DIAGNOSIS — Z794 Long term (current) use of insulin: Secondary | ICD-10-CM

## 2022-02-05 DIAGNOSIS — I10 Essential (primary) hypertension: Secondary | ICD-10-CM | POA: Diagnosis present

## 2022-02-05 DIAGNOSIS — G8929 Other chronic pain: Secondary | ICD-10-CM | POA: Diagnosis present

## 2022-02-05 DIAGNOSIS — Z885 Allergy status to narcotic agent status: Secondary | ICD-10-CM | POA: Diagnosis not present

## 2022-02-05 DIAGNOSIS — Z833 Family history of diabetes mellitus: Secondary | ICD-10-CM | POA: Diagnosis not present

## 2022-02-05 DIAGNOSIS — Z7951 Long term (current) use of inhaled steroids: Secondary | ICD-10-CM

## 2022-02-05 DIAGNOSIS — Z91018 Allergy to other foods: Secondary | ICD-10-CM | POA: Diagnosis not present

## 2022-02-05 DIAGNOSIS — R0602 Shortness of breath: Principal | ICD-10-CM

## 2022-02-05 DIAGNOSIS — E662 Morbid (severe) obesity with alveolar hypoventilation: Secondary | ICD-10-CM

## 2022-02-05 DIAGNOSIS — E1122 Type 2 diabetes mellitus with diabetic chronic kidney disease: Secondary | ICD-10-CM | POA: Diagnosis present

## 2022-02-05 DIAGNOSIS — E1140 Type 2 diabetes mellitus with diabetic neuropathy, unspecified: Secondary | ICD-10-CM

## 2022-02-05 DIAGNOSIS — M25561 Pain in right knee: Secondary | ICD-10-CM | POA: Diagnosis not present

## 2022-02-05 DIAGNOSIS — E1169 Type 2 diabetes mellitus with other specified complication: Secondary | ICD-10-CM | POA: Diagnosis present

## 2022-02-05 DIAGNOSIS — N1831 Chronic kidney disease, stage 3a: Secondary | ICD-10-CM

## 2022-02-05 DIAGNOSIS — Z79899 Other long term (current) drug therapy: Secondary | ICD-10-CM

## 2022-02-05 DIAGNOSIS — J9611 Chronic respiratory failure with hypoxia: Secondary | ICD-10-CM | POA: Diagnosis present

## 2022-02-05 DIAGNOSIS — G894 Chronic pain syndrome: Secondary | ICD-10-CM | POA: Diagnosis present

## 2022-02-05 HISTORY — DX: Acute on chronic diastolic (congestive) heart failure: I50.33

## 2022-02-05 LAB — CBC
HCT: 38.8 % (ref 36.0–46.0)
Hemoglobin: 12.2 g/dL (ref 12.0–15.0)
MCH: 30 pg (ref 26.0–34.0)
MCHC: 31.4 g/dL (ref 30.0–36.0)
MCV: 95.3 fL (ref 80.0–100.0)
Platelets: 331 10*3/uL (ref 150–400)
RBC: 4.07 MIL/uL (ref 3.87–5.11)
RDW: 13.6 % (ref 11.5–15.5)
WBC: 12 10*3/uL — ABNORMAL HIGH (ref 4.0–10.5)
nRBC: 0 % (ref 0.0–0.2)

## 2022-02-05 LAB — BLOOD GAS, VENOUS
Acid-Base Excess: 3.1 mmol/L — ABNORMAL HIGH (ref 0.0–2.0)
Bicarbonate: 29.4 mmol/L — ABNORMAL HIGH (ref 20.0–28.0)
O2 Saturation: 93.7 %
Patient temperature: 37
pCO2, Ven: 52 mmHg (ref 44–60)
pH, Ven: 7.36 (ref 7.25–7.43)
pO2, Ven: 64 mmHg — ABNORMAL HIGH (ref 32–45)

## 2022-02-05 LAB — RESP PANEL BY RT-PCR (FLU A&B, COVID) ARPGX2
Influenza A by PCR: NEGATIVE
Influenza B by PCR: NEGATIVE
SARS Coronavirus 2 by RT PCR: NEGATIVE

## 2022-02-05 LAB — BASIC METABOLIC PANEL
Anion gap: 13 (ref 5–15)
BUN: 53 mg/dL — ABNORMAL HIGH (ref 6–20)
CO2: 28 mmol/L (ref 22–32)
Calcium: 8.6 mg/dL — ABNORMAL LOW (ref 8.9–10.3)
Chloride: 95 mmol/L — ABNORMAL LOW (ref 98–111)
Creatinine, Ser: 1.41 mg/dL — ABNORMAL HIGH (ref 0.44–1.00)
GFR, Estimated: 47 mL/min — ABNORMAL LOW (ref >60)
Glucose, Bld: 252 mg/dL — ABNORMAL HIGH (ref 70–99)
Potassium: 3.9 mmol/L (ref 3.5–5.1)
Sodium: 136 mmol/L (ref 135–145)

## 2022-02-05 LAB — CBG MONITORING, ED
Glucose-Capillary: 196 mg/dL — ABNORMAL HIGH (ref 70–99)
Glucose-Capillary: 269 mg/dL — ABNORMAL HIGH (ref 70–99)
Glucose-Capillary: 243 mg/dL — ABNORMAL HIGH (ref 70–99)

## 2022-02-05 LAB — TROPONIN I (HIGH SENSITIVITY)
Troponin I (High Sensitivity): 15 ng/L (ref <18)
Troponin I (High Sensitivity): 16 ng/L (ref <18)

## 2022-02-05 LAB — D-DIMER, QUANTITATIVE: D-Dimer, Quant: 0.46 ug/mL-FEU (ref 0.00–0.50)

## 2022-02-05 LAB — VANCOMYCIN, TROUGH: Vancomycin Tr: 19 ug/mL (ref 15–20)

## 2022-02-05 LAB — BRAIN NATRIURETIC PEPTIDE: B Natriuretic Peptide: 18.7 pg/mL (ref 0.0–100.0)

## 2022-02-05 LAB — GLUCOSE, CAPILLARY: Glucose-Capillary: 242 mg/dL — ABNORMAL HIGH (ref 70–99)

## 2022-02-05 MED ORDER — INSULIN DEGLUDEC 200 UNIT/ML ~~LOC~~ SOPN: PEN_INJECTOR | Freq: Two times a day (BID) | SUBCUTANEOUS | Status: DC

## 2022-02-05 MED ORDER — ENOXAPARIN SODIUM 40 MG/0.4ML IJ SOSY
40.0000 mg | PREFILLED_SYRINGE | Freq: Two times a day (BID) | INTRAMUSCULAR | Status: DC
  Administered 2022-02-05 – 2022-02-07 (×5): 40 mg via SUBCUTANEOUS
  Filled 2022-02-05 (×5): qty 0.4

## 2022-02-05 MED ORDER — LEVOFLOXACIN IN D5W 750 MG/150ML IV SOLN
750.0000 mg | INTRAVENOUS | Status: DC
  Administered 2022-02-05 – 2022-02-07 (×3): 750 mg via INTRAVENOUS
  Filled 2022-02-05 (×3): qty 150

## 2022-02-05 MED ORDER — SODIUM CHLORIDE 0.9% FLUSH
10.0000 mL | Freq: Two times a day (BID) | INTRAVENOUS | Status: DC
  Administered 2022-02-05 – 2022-02-07 (×4): 10 mL

## 2022-02-05 MED ORDER — INSULIN GLARGINE-YFGN 100 UNIT/ML ~~LOC~~ SOLN
46.0000 [IU] | Freq: Two times a day (BID) | SUBCUTANEOUS | Status: DC
  Administered 2022-02-05 – 2022-02-07 (×4): 46 [IU] via SUBCUTANEOUS
  Filled 2022-02-05 (×5): qty 0.46

## 2022-02-05 MED ORDER — ASPIRIN EC 81 MG PO TBEC
81.0000 mg | DELAYED_RELEASE_TABLET | Freq: Every day | ORAL | Status: DC
  Administered 2022-02-05 – 2022-02-07 (×3): 81 mg via ORAL
  Filled 2022-02-05 (×3): qty 1

## 2022-02-05 MED ORDER — SODIUM CHLORIDE 0.9 % IV SOLN
100.0000 mg | INTRAVENOUS | Status: DC
  Administered 2022-02-06 – 2022-02-07 (×2): 100 mg via INTRAVENOUS
  Filled 2022-02-05 (×2): qty 100

## 2022-02-05 MED ORDER — INSULIN GLARGINE-YFGN 100 UNIT/ML ~~LOC~~ SOLN
30.0000 [IU] | Freq: Two times a day (BID) | SUBCUTANEOUS | Status: DC
  Administered 2022-02-05: 30 [IU] via SUBCUTANEOUS
  Filled 2022-02-05 (×2): qty 0.3

## 2022-02-05 MED ORDER — CHLORHEXIDINE GLUCONATE CLOTH 2 % EX PADS
6.0000 | MEDICATED_PAD | Freq: Every day | CUTANEOUS | Status: DC
  Administered 2022-02-06: 6 via TOPICAL

## 2022-02-05 MED ORDER — SODIUM CHLORIDE 0.9% FLUSH
10.0000 mL | INTRAVENOUS | Status: DC | PRN
  Administered 2022-02-06 – 2022-02-07 (×2): 10 mL

## 2022-02-05 MED ORDER — VANCOMYCIN HCL IN DEXTROSE 1-5 GM/200ML-% IV SOLN
1000.0000 mg | Freq: Once | INTRAVENOUS | Status: AC
  Administered 2022-02-05: 1000 mg via INTRAVENOUS
  Filled 2022-02-05: qty 200

## 2022-02-05 MED ORDER — INSULIN ASPART 100 UNIT/ML IJ SOLN
0.0000 [IU] | Freq: Every day | INTRAMUSCULAR | Status: DC
  Administered 2022-02-05: 2 [IU] via SUBCUTANEOUS
  Administered 2022-02-06: 3 [IU] via SUBCUTANEOUS
  Filled 2022-02-05: qty 0.05

## 2022-02-05 MED ORDER — SODIUM CHLORIDE 0.9 % IV SOLN
200.0000 mg | Freq: Once | INTRAVENOUS | Status: AC
  Administered 2022-02-05: 200 mg via INTRAVENOUS
  Filled 2022-02-05: qty 200

## 2022-02-05 MED ORDER — ACETAMINOPHEN 650 MG RE SUPP: 650.0000 mg | Freq: Four times a day (QID) | RECTAL | Status: DC | PRN

## 2022-02-05 MED ORDER — VANCOMYCIN HCL 1500 MG/300ML IV SOLN
1500.0000 mg | Freq: Every day | INTRAVENOUS | Status: DC
  Filled 2022-02-05: qty 300

## 2022-02-05 MED ORDER — INSULIN ASPART 100 UNIT/ML IJ SOLN
0.0000 [IU] | Freq: Three times a day (TID) | INTRAMUSCULAR | Status: DC
  Administered 2022-02-05 – 2022-02-06 (×2): 7 [IU] via SUBCUTANEOUS
  Administered 2022-02-06: 15 [IU] via SUBCUTANEOUS
  Administered 2022-02-06: 7 [IU] via SUBCUTANEOUS
  Administered 2022-02-07: 11 [IU] via SUBCUTANEOUS
  Administered 2022-02-07: 7 [IU] via SUBCUTANEOUS
  Filled 2022-02-05: qty 0.2

## 2022-02-05 MED ORDER — MOMETASONE FURO-FORMOTEROL FUM 200-5 MCG/ACT IN AERO
2.0000 | INHALATION_SPRAY | Freq: Two times a day (BID) | RESPIRATORY_TRACT | Status: DC
Start: 2022-02-05 — End: 2022-02-07
  Administered 2022-02-05 – 2022-02-07 (×5): 2 via RESPIRATORY_TRACT
  Filled 2022-02-05: qty 8.8

## 2022-02-05 MED ORDER — GABAPENTIN 300 MG PO CAPS
600.0000 mg | ORAL_CAPSULE | Freq: Three times a day (TID) | ORAL | Status: DC
  Administered 2022-02-05 – 2022-02-07 (×8): 600 mg via ORAL
  Filled 2022-02-05 (×8): qty 2

## 2022-02-05 MED ORDER — INSULIN ASPART 100 UNIT/ML IJ SOLN
0.0000 [IU] | Freq: Every day | INTRAMUSCULAR | Status: DC
  Filled 2022-02-05: qty 0.05

## 2022-02-05 MED ORDER — UMECLIDINIUM BROMIDE 62.5 MCG/ACT IN AEPB
1.0000 | INHALATION_SPRAY | Freq: Every day | RESPIRATORY_TRACT | Status: DC
  Administered 2022-02-06 – 2022-02-07 (×2): 1 via RESPIRATORY_TRACT
  Filled 2022-02-05: qty 7

## 2022-02-05 MED ORDER — FUROSEMIDE 10 MG/ML IJ SOLN
80.0000 mg | Freq: Two times a day (BID) | INTRAMUSCULAR | Status: DC
  Administered 2022-02-05 – 2022-02-07 (×5): 80 mg via INTRAVENOUS
  Filled 2022-02-05 (×5): qty 8

## 2022-02-05 MED ORDER — TIOTROPIUM BROMIDE MONOHYDRATE 1.25 MCG/ACT IN AERS: 2.0000 | INHALATION_SPRAY | Freq: Every day | RESPIRATORY_TRACT | Status: DC

## 2022-02-05 MED ORDER — INSULIN ASPART 100 UNIT/ML IJ SOLN
4.0000 [IU] | Freq: Three times a day (TID) | INTRAMUSCULAR | Status: DC
  Administered 2022-02-05 – 2022-02-06 (×2): 4 [IU] via SUBCUTANEOUS
  Filled 2022-02-05: qty 0.04

## 2022-02-05 MED ORDER — INSULIN ASPART 100 UNIT/ML IJ SOLN
0.0000 [IU] | Freq: Three times a day (TID) | INTRAMUSCULAR | Status: DC
  Administered 2022-02-05: 3 [IU] via SUBCUTANEOUS
  Administered 2022-02-05: 8 [IU] via SUBCUTANEOUS
  Filled 2022-02-05: qty 0.15

## 2022-02-05 MED ORDER — ACETAMINOPHEN 325 MG PO TABS: 650.0000 mg | ORAL_TABLET | Freq: Four times a day (QID) | ORAL | Status: DC | PRN

## 2022-02-05 MED ORDER — OXYCODONE-ACETAMINOPHEN 7.5-325 MG PO TABS
1.0000 | ORAL_TABLET | Freq: Once | ORAL | Status: AC
  Administered 2022-02-05: 1 via ORAL
  Filled 2022-02-05: qty 1

## 2022-02-05 MED ORDER — ALBUTEROL SULFATE (2.5 MG/3ML) 0.083% IN NEBU: 3.0000 mL | INHALATION_SOLUTION | Freq: Four times a day (QID) | RESPIRATORY_TRACT | Status: DC | PRN

## 2022-02-05 NOTE — Assessment & Plan Note (Addendum)
Uncontrolled IDDM-2 with hyperglycemia and other complications.  A1c persistently > 15.5%.  On Tresiba 46 units twice daily, NovoLog 20 units 3 times daily and Trulicity at home ?Recent Labs  ?Lab 02/06/22 ?1219 02/06/22 ?1739 02/06/22 ?2131 02/07/22 ?0761 02/07/22 ?1117  ?GLUCAP 316* 237* 255* 203* 269*  ?-Discharged on home insulin and Trulicity ?-Recommend statin if no contraindication ? ?

## 2022-02-05 NOTE — Progress Notes (Signed)
Pt was placed on auto BIPAP. Pt is on a large BIPAP mask with 3l bled in. RT will continue to monitor ?

## 2022-02-05 NOTE — Assessment & Plan Note (Addendum)
Stable, no wheezing ?-Continue home inhalers ?

## 2022-02-05 NOTE — Assessment & Plan Note (Addendum)
Recent Labs  ?  10/24/21 ?2706 10/25/21 ?0610 11/04/21 ?1105 12/18/21 ?2328 12/19/21 ?2376 12/20/21 ?0238 12/21/21 ?0157 02/05/22 ?0110 02/06/22 ?2831 02/07/22 ?0357  ?BUN 49* 37* 53* 53* 69* 48* 38* 53* 64* 55*  ?CREATININE 2.38* 1.44* 1.38* 1.76* 1.40* 1.62* 1.18* 1.41* 1.35* 1.17*  1.21*  ?Renal function improved.  Recheck at follow-up. ? ?

## 2022-02-05 NOTE — Assessment & Plan Note (Signed)
BiPAP as needed and at bedtime. ?

## 2022-02-05 NOTE — H&P (Signed)
History and Physical    [name] JHE:[id number] DOB: 1976-02-19 DOA: 02/05/2022  DOS: the patient was seen and examined on 02/05/2022  PCP: [name], MD   Patient coming from: Home  I have personally briefly reviewed patient's old medical records in [place]  Patient is a 46 year old female with a past medical history of class III obesity (BMI 62.74), asthma, chronic diastolic CHF, OSA/OHS, chronic respiratory failure, poorly controlled insulin-dependent type 2 diabetes, hypertension, hyperlipidemia, GERD, chronic pain syndrome, CKD stage IIIa, bipolar disorder, depression, PAD, history of left BKA and right TMA. Admitted 12/18/2021-12/21/2021 for HHS and nonhealing diabetic right foot ulcer at TMA stump site with MRI suspicious for early acute osteomyelitis for which orthopedics recommended conservative management.  She was discharged with a 4-week course of Augmentin.  Subsequently admitted to [facility] from 1/19-1/28 and underwent surgical debridement and discharged on vancomycin, levofloxacin, and micafungin for 6 weeks.  Seen by ID at [facility] on 2/23 and recommendation was to continue the same regimen.  Patient presents to the ED via EMS complaining of shortness of breath x2 days.  Oxygen saturation 93% on 2 L home oxygen.  She was given DuoNeb treatment by EMS.  Tachycardic and tachypneic on arrival to the ED.  Not febrile.  Maintaining sats in the 90s on 2 L oxygen, same as home requirement.  Labs showing WBC 12.0.  Hemoglobin normal.  Blood glucose 252.  BUN 53, creatinine 1.4 (baseline 1.1-1.4).  BNP normal.  High-sensitivity troponin negative x2.  D-dimer normal.  COVID and influenza PCR negative.  Chest x-ray showing mild central vascular congestion and no focal consolidation.  Patient states she was eating a lot of salt and started feeling short of breath a few days ago.  Feels short of breath at rest and with exertion.  Also endorsing orthopnea.  Denies chest pain, cough, or  wheezing.  She is taking torsemide 80 mg daily and has not missed any doses.  Denies history of blood clots.  She uses 2 L oxygen PRN during the day and BiPAP at night.  No other complaints.   Review of Systems:  Review of Systems  All other systems reviewed and are negative.  Past Medical History:  Diagnosis Date   Acute osteomyelitis of ankle or foot, left ([place]) 01/31/2018   Alveolar hypoventilation    Anemia    not on iron pill   Asthma    Bipolar 2 disorder ([name])    Carpal tunnel syndrome on right    recurrent   Cellulitis 08/2010-08/2011   Chronic pain    COPD (chronic obstructive pulmonary disease) ([name])    Symbicort daily and Proventil as needed   Costochondritis    Depression    Diabetes mellitus type II, uncontrolled 2000   Type 2, Uncontrolled.Takes Lantus daily.Fasting blood sugar runs 150   Drug-seeking behavior    GERD (gastroesophageal reflux disease)    takes Pantoprazole and Zantac daily   HLD (hyperlipidemia)    takes Atorvastatin daily   Hypertension    takes Lisinopril and Coreg daily   Morbid obesity ([name])    Nocturia    OSA on CPAP    Peripheral neuropathy    takes Gabapentin daily   Pneumonia    "walking" several yrs ago and as a baby (12/05/2018)   Rectal fissure    Restless leg    SVT (supraventricular tachycardia) ([place])    Syncope 02/25/2016   Urinary frequency    Urinary incontinence 10/23/2020   Varicose  veins    Right medial thigh and Left leg     Past Surgical History:  Procedure Laterality Date   AMPUTATION Left 02/01/2018   Procedure: LEFT FOURTH AND 5TH TOE RAY AMPUTATION;  Surgeon: [name], MD;  Location: [place];  Service: Orthopedics;  Laterality: Left;   AMPUTATION Left 03/03/2018   Procedure: LEFT BELOW KNEE AMPUTATION;  Surgeon: [name], MD;  Location: [place];  Service: Orthopedics;  Laterality: Left;   CARPAL TUNNEL RELEASE Bilateral    CESAREAN SECTION  2007   CORONARY ANGIOGRAPHY N/A 11/26/2020   Procedure:  CORONARY ANGIOGRAPHY;  Surgeon: Sherren Mocha, MD;  Location: [place] CV LAB;  Service: Cardiovascular;  Laterality: N/A;   INCISION AND DRAINAGE ABSCESS Left 10/16/2021   Procedure: INCISION AND DRAINAGE ABSCESS;  Surgeon: Dwan Bolt, MD;  Location: [place];  Service: General;  Laterality: Left;   INCISION AND DRAINAGE PERIRECTAL ABSCESS Left 05/18/2019   Procedure: IRRIGATION AND DEBRIDEMENT OF PANNIS ABSCESS, POSSIBLE DEBRIDEMENT OF BUTTOCK WOUND;  Surgeon: Donnie Mesa, MD;  Location: [place];  Service: General;  Laterality: Left;   IRRIGATION AND DEBRIDEMENT BUTTOCKS Left 05/17/2019   Procedure: IRRIGATION AND DEBRIDEMENT BUTTOCKS;  Surgeon: Donnie Mesa, MD;  Location: [place];  Service: General;  Laterality: Left;   KNEE ARTHROSCOPY Right 07/17/2010   LEFT HEART CATHETERIZATION WITH CORONARY ANGIOGRAM N/A 07/27/2012   Procedure: LEFT HEART CATHETERIZATION WITH CORONARY ANGIOGRAM;  Surgeon: Sherren Mocha, MD;  Location: [facility];  Service: Cardiovascular;  Laterality: N/A;   MASS EXCISION N/A 06/29/2013   Procedure:  WIDE LOCAL EXCISION OF POSTERIOR NECK ABSCESS;  Surgeon: Ralene Ok, MD;  Location: [place];  Service: General;  Laterality: N/A;   REPAIR KNEE LIGAMENT Left    "fixed ligaments and chipped patella"   right transmetatarsal amputation        reports that she quit smoking about 16 years ago. Her smoking use included cigarettes. She has a 3.75 pack-year smoking history. She has never used smokeless tobacco. She reports that she does not currently use drugs. She reports that she does not drink alcohol.  Allergies  Allergen Reactions   Cefepime Other (See Comments)    AKI, see records from [place] hospitalization in January 2020.   Other reaction(s): Other (See Comments) "Shut down organs/kidneys"  Note pt has tolerated Rocephin and Keflex   Kiwi Extract Shortness Of Breath, Swelling and Anaphylaxis   Morphine And Related Nausea And Vomiting   Nalbuphine      Other reaction(s): Hallucinations, Other (See Comments) "FEELS LIKE SOMETHING CRAWLING ON ME" Reaction:  Nervousness "FEELS LIKE SOMETHING CRAWLING ON ME" Reaction:  Nervousness Reaction:  Nervousness "FEELS LIKE SOMETHING CRAWLING ON ME"    Trental [Pentoxifylline] Nausea And Vomiting   Toradol [Ketorolac Tromethamine] Other (See Comments)    Feels like something is crawling on me   Morphine Nausea And Vomiting   Nubain [Nalbuphine Hcl] Other (See Comments)    "FEELS LIKE SOMETHING CRAWLING ON ME"    Family History  Problem Relation Age of Onset   Diabetes Mother    Hyperlipidemia Mother    Depression Mother    GER disease Mother    Allergic rhinitis Mother    Restless legs syndrome Mother    Heart attack Paternal Uncle    Heart disease Paternal Grandmother    Heart attack Paternal Grandmother    Heart attack Paternal Grandfather    Heart disease Paternal Grandfather    Heart attack Father  Migraines Sister    Cancer Maternal Grandmother        COLON   Hypertension Maternal Grandmother    Hyperlipidemia Maternal Grandmother    Diabetes Maternal Grandmother    Other Maternal Grandfather        GUN SHOT   Anxiety disorder Sister    Asthma Child     Prior to Admission medications   Medication Sig Start Date End Date Taking? Authorizing Provider  albuterol (VENTOLIN HFA) 108 (90 Base) MCG/ACT inhaler INHALE 1 TO 2 PUFFS BY MOUTH EVERY SIX HOURS AS NEEDED FOR WHEEZING OR SHORTNESS OF BREATH Patient taking differently: Inhale 1 puff into the lungs every 6 (six) hours as needed for shortness of breath. 07/16/21  Yes [name], MD  aspirin EC 81 MG tablet Take 1 tablet (81 mg total) by mouth daily. Swallow whole. 11/19/20  Yes Sherren Mocha, MD  Cholecalciferol 1000 units tablet Take 1 tablet (1,000 Units total) by mouth daily. 04/05/17  Yes [name], MD  gabapentin (NEURONTIN) 300 MG capsule Take 3 capsules (900 mg total) by mouth 3 (three) times  daily. Patient taking differently: Take 600 mg by mouth 3 (three) times daily. 12/21/21  Yes Gerrit Heck, MD  insulin aspart (NOVOLOG FLEXPEN) 100 UNIT/ML FlexPen Inject 20 Units into the skin 3 (three) times daily with meals. Patient taking differently: Inject 30 Units into the skin 3 (three) times daily with meals. Sliding scale 10/25/21  Yes Brimage, Vondra, DO  insulin degludec (TRESIBA FLEXTOUCH) 200 UNIT/ML FlexTouch Pen Inject 46 Units into the skin 2 (two) times daily. Patient taking differently: Inject 30 Units into the skin 2 (two) times daily. 10/25/21  Yes Brimage, Vondra, DO  oxyCODONE-acetaminophen (PERCOCET) 7.5-325 MG tablet Take 1 tablet by mouth every 8 (eight) hours as needed for severe pain. 02/03/22  Yes Dickie La, MD  potassium chloride SA (KLOR-CON M20) 20 MEQ tablet Take 1 tablet (20 mEq total) by mouth daily. 09/09/20  Yes Weaver, Scott T, PA-C  ramelteon (ROZEREM) 8 MG tablet TAKE 1 TABLET BY MOUTH AT BEDTIME Patient taking differently: Take 8 mg by mouth at bedtime. 12/02/21  Yes Martyn Malay, MD  SYMBICORT 160-4.5 MCG/ACT inhaler inhale 2 PUFFS into THE lungs 2 TIMES DAILY 01/04/22  Yes Hensel, Jamal Collin, MD  terconazole (TERAZOL 7) 0.4 % vaginal cream Place 1 applicator vaginally at bedtime. 12/18/21  Yes Martyn Malay, MD  Tiotropium Bromide Monohydrate (SPIRIVA RESPIMAT) 1.25 MCG/ACT AERS Inhale 2 puffs into the lungs daily. 11/14/20  Yes Martyn Ehrich, NP  torsemide (DEMADEX) 20 MG tablet TAKE 4 TABLETS BY MOUTH ONCE A DAY Patient taking differently: Take 80 mg by mouth daily. 11/25/21  Yes Lind Covert, MD  TRULICITY 1.5 YK/9.9IP SOPN inject 1.23m into THE SKIN ONCE A WEEK Patient taking differently: Inject 1.75 mg as directed every Sunday. 05/01/21  Yes MLeeanne Rio MD  Accu-Chek FastClix Lancets MISC USE DAILY AS INSTRUCTED 05/18/21   MLeeanne Rio MD  ACCU-CHEK GUIDE test strip USE T0 CHECK BLOOD SUGAR 3 TIMES DAILY 11/05/21    MLeeanne Rio MD  Accu-Chek Softclix Lancets lancets Use to check blood glucose four times daily 04/17/20   BMartyn Malay MD  atorvastatin (LIPITOR) 80 MG tablet Take 1 tablet (80 mg total) by mouth daily. Patient not taking: Reported on 12/19/2021 02/11/21   MLeeanne Rio MD  atropine 1 % ophthalmic solution Place 1 drop into the right eye daily. Patient not  taking: Reported on 02/05/2022 12/22/21   Gerrit Heck, MD  Continuous Blood Gluc Sensor (DEXCOM G6 SENSOR) MISC Inject 1 applicator into the skin as directed. Change sensor every 10 days. 11/05/21   [name], MD  Continuous Blood Gluc Transmit (DEXCOM G6 TRANSMITTER) MISC Inject 1 Device into the skin as directed. Reuse 8 times with sensor changes. 09/03/21   [name], MD  Ferrous Sulfate (IRON) 325 (65 Fe) MG TABS Take 1 tablet (325 mg total) by mouth every other day. Patient not taking: Reported on 12/19/2021 03/31/21   [name], MD  Glucagon (BAQSIMI ONE PACK) 3 MG/DOSE POWD Place 3 mg into the nose as needed (low blood sugar).    [provider]  Glucagon, rDNA, (GLUCAGON EMERGENCY) 1 MG KIT Inject 1 mg into the muscle as needed for low blood sugar. 11/03/21   [provider]  GNP ULTICARE PEN NEEDLES 32G X 4 MM MISC USE TO inject insulin 2 TIMES DAILY 10/02/21   [name], MD  Misc. Devices (TRANSFER BENCH) MISC Use daily 10/29/20   [name], MD  prednisoLONE acetate (PRED FORTE) 1 % ophthalmic suspension Place 1 drop into the right eye every 2 (two) hours while awake. Patient not taking: Reported on 02/05/2022 12/21/21   Gerrit Heck, MD    Physical Exam: Vitals:   02/05/22 0430 02/05/22 0500 02/05/22 0600 02/05/22 0700  BP: 115/64 (!) 119/54 104/61 111/68  Pulse: 92 92 92 88  Resp: '12 16 12 ' (!) 21  Temp:      TempSrc:      SpO2: 92% 96% 95% 96%  Weight:      Height:        Physical Exam Constitutional:      General: She is not in  acute distress. HENT:     Head: Normocephalic and atraumatic.  Eyes:     Extraocular Movements: Extraocular movements intact.     Conjunctiva/sclera: Conjunctivae normal.  Cardiovascular:     Rate and Rhythm: Normal rate and regular rhythm.     Pulses: Normal pulses.  Pulmonary:     Effort: Pulmonary effort is normal. No respiratory distress.     Breath sounds: No wheezing.     Comments: Examination limited due to the patient's body habitus Mild crackles appreciated at the bases Abdominal:     General: Bowel sounds are normal.     Palpations: Abdomen is soft.     Tenderness: There is no abdominal tenderness. There is no guarding.  Musculoskeletal:     Cervical back: Normal range of motion and neck supple.     Comments: Left BKA +2 pitting edema of right lower extremity  Skin:    General: Skin is warm and dry.  Neurological:     General: No focal deficit present.     Mental Status: She is alert and oriented to person, place, and time.     Labs on Admission: I have personally reviewed following labs and imaging studies  CBC: Recent Labs  Lab 02/05/22 0110  WBC 12.0*  HGB 12.2  HCT 38.8  MCV 95.3  PLT 132   Basic Metabolic Panel: Recent Labs  Lab 02/05/22 0110  NA 136  K 3.9  CL 95*  CO2 28  GLUCOSE 252*  BUN 53*  CREATININE 1.41*  CALCIUM 8.6*   GFR: Estimated Creatinine Clearance: 74.2 mL/min (A) (by C-G formula based on SCr of 1.41 mg/dL (H)). Liver Function Tests: No results for input(s): AST,  ALT, ALKPHOS, BILITOT, PROT, ALBUMIN in the last 168 hours. No results for input(s): LIPASE, AMYLASE in the last 168 hours. No results for input(s): AMMONIA in the last 168 hours. Coagulation Profile: No results for input(s): INR, PROTIME in the last 168 hours. Cardiac Enzymes: No results for input(s): CKTOTAL, CKMB, CKMBINDEX, TROPONINI in the last 168 hours. BNP (last 3 results) No results for input(s): PROBNP in the last 8760 hours. HbA1C: No results for  input(s): HGBA1C in the last 72 hours. CBG: Recent Labs  Lab 02/05/22 0750  GLUCAP 196*   Lipid Profile: No results for input(s): CHOL, HDL, LDLCALC, TRIG, CHOLHDL, LDLDIRECT in the last 72 hours. Thyroid Function Tests: No results for input(s): TSH, T4TOTAL, FREET4, T3FREE, THYROIDAB in the last 72 hours. Anemia Panel: No results for input(s): VITAMINB12, FOLATE, FERRITIN, TIBC, IRON, RETICCTPCT in the last 72 hours. Urine analysis:    Component Value Date/Time   COLORURINE YELLOW 12/19/2021 0932   APPEARANCEUR CLEAR 12/19/2021 0932   LABSPEC 1.010 12/19/2021 0932   PHURINE 5.0 12/19/2021 0932   GLUCOSEU >=500 (A) 12/19/2021 0932   HGBUR TRACE (A) 12/19/2021 0932   BILIRUBINUR NEGATIVE 12/19/2021 0932   BILIRUBINUR negative 09/29/2021 1220   KETONESUR NEGATIVE 12/19/2021 0932   PROTEINUR NEGATIVE 12/19/2021 0932   UROBILINOGEN 0.2 09/29/2021 1220   UROBILINOGEN 0.2 08/22/2015 0225   NITRITE NEGATIVE 12/19/2021 0932   LEUKOCYTESUR TRACE (A) 12/19/2021 0932    Radiological Exams on Admission: I have personally reviewed images DG Chest Port 1 View  Result Date: 02/05/2022 CLINICAL DATA:  Shortness of breath. EXAM: PORTABLE CHEST 1 VIEW COMPARISON:  Chest radiograph dated 10/16/2021. FINDINGS: Mild cardiomegaly with mild central vascular congestion. No focal consolidation, pleural effusion, pneumothorax. No acute osseous pathology. IMPRESSION: Mild cardiomegaly with mild central vascular congestion. No focal consolidation. Electronically Signed   By: Anner Crete M.D.   On: 02/05/2022 01:22    EKG: I have personally reviewed EKG: Sinus tachycardia, no acute ischemic changes.  Assessment/Plan Principal Problem:   Acute on chronic diastolic (congestive) heart failure ([name]) Active Problems:   Chronic respiratory failure ([name])   Insulin dependent type 2 diabetes mellitus ([name])   Leukocytosis   Hypertension   Asthma, chronic   Chronic kidney disease, stage 3a ([name])    Diabetic ulcer of right foot ([place])    Assessment and Plan: * Acute on chronic diastolic (congestive) heart failure ([place]) Patient is endorsing dyspnea and orthopnea in the setting of increased dietary sodium intake.  She takes torsemide 80 mg daily.  BNP likely falsely normal in the setting of class III obesity.  She has peripheral edema on exam.  Chest x-ray showing mild central vascular congestion.  Echo done in March 2022 showing EF 55 to 60%.  COVID and influenza PCR negative.  Do not think dyspnea is due to asthma exacerbation as she is not wheezing.  PE less likely given normal D-dimer.  ACS less likely given high-sensitivity troponin negative x2 and no complaints of chest pain. -IV Lasix 80 mg twice daily.  Monitor renal function, intake and output, and daily weights.  Repeat echocardiogram.  Chronic respiratory failure ([name]) OSA/OHS Currently stable on 2 L oxygen, same as home requirement.  She uses nightly BiPAP at home. -Continue supplemental oxygen during the day and BiPAP at night.  VBG ordered to assess for hypercapnia.  Insulin dependent type 2 diabetes mellitus ([name]) Poorly controlled-blood glucose 252.  A1c >15.5 on 12/19/2021. -Continue current dose of home long-acting insulin.  Order sliding  scale insulin moderate ACHS.  Diabetes coordinator consulted.  Leukocytosis Mild and likely reactive.  No infectious signs or symptoms. -Continue to monitor  Diabetic ulcer of right foot [facility]) Admitted in January 2023 for this problem and MRI done at that time was suspicious for early acute osteomyelitis.  Underwent surgical debridement at [facility] and discharged with PICC line on vancomycin, Levaquin, and micafungin x6 weeks (end of therapy 02/09/2022). Seen by ID at [facility] on 2/23 and recommendation was to continue the same regimen. -Continue antibiotics and antifungal agent.  Outpatient ID follow-up.  Chronic kidney disease, stage 3a ([name]) Creatinine currently 1.4, baseline  1.1-1.4. -Continue to monitor renal function  Asthma, chronic Stable, no wheezing -Continue home inhalers  Hypertension Stable. -Continue IV Lasix and monitor blood pressure   DVT prophylaxis: Lovenox Code Status: Full Code Family Communication: No family available at this time. Admission status: Inpatient, Telemetry bed It is my clinical opinion that admission to INPATIENT is reasonable and necessary because of the expectation that this patient will require hospital care that crosses at least 2 midnights to treat this condition based on the medical complexity of the problems presented.  Given the aforementioned information, the predictability of an adverse outcome is felt to be significant.   Shela Leff, MD [facility] 02/05/2022, 8:14 AM

## 2022-02-05 NOTE — Assessment & Plan Note (Signed)
>>  ASSESSMENT AND PLAN FOR INSULIN DEPENDENT TYPE 2 DIABETES MELLITUS ([place]) WRITTEN ON 02/07/2022  4:04 PM BY GONFA, TAYE T, MD  Uncontrolled IDDM-2 with hyperglycemia and other complications.  A1c persistently > 15.5%.  On Tresiba 46 units twice daily, NovoLog 20 units 3 times daily and Trulicity at home Recent Labs  Lab 02/06/22 1219 02/06/22 1739 02/06/22 2131 02/07/22 0915 02/07/22 1117  GLUCAP 316* 237* 255* 203* 269*  -Discharged on home insulin and Trulicity -Recommend statin if no contraindication

## 2022-02-05 NOTE — Assessment & Plan Note (Addendum)
Mild and likely reactive.  Resolved. ?

## 2022-02-05 NOTE — Assessment & Plan Note (Addendum)
On 2 L during the day and BiPAP at night at baseline.  VBG reassuring.  Breathing has improved. ?-Continue home oxygen and CPAP ? ?

## 2022-02-05 NOTE — Assessment & Plan Note (Addendum)
TTE in 02/2021 with LVEF of 55 to 60% but limited exam due to suboptimal acoustic window.  Presented with dyspnea and orthopnea.  Likely due to salt indiscretion.  Diuresed with IV Lasix 80 mg twice daily with improvement in his symptoms.  Weight is stable at 160.5 kg.  Intake and output was not well captured.  It is difficult to assess fluid status given body habitus.  ?-Discharged on home torsemide 80 mg twice daily ?-She will continue potassium ?-Counseled on sodium and fluid restriction ?

## 2022-02-05 NOTE — ED Provider Notes (Signed)
[facility] DEPT Provider Note: Georgena Spurling, MD, FACEP  CSN: [id number] MRN: [id number] ARRIVAL: 02/05/22 at [place]: [id number]   CHIEF COMPLAINT  Shortness of Breath   HISTORY OF PRESENT ILLNESS  02/05/22 2:21 AM [name] is a 46 y.o. female on home oxygen for CHF and COPD.  She is here with shortness of breath for the past 2 days.  It is worse when lying supine and somewhat improved when sitting upright.  She has not gotten relief with her CPAP machine at home which usually helps.  She is not aware of wheezing and does not feel like this is a COPD exacerbation shows she has not been using her albuterol at home.  EMS thought she had some wheezing in the right base and she was given a neb treatment prior to arrival with equivocal improvement.  She recently had right foot surgery and is currently living in a recliner.  She has an aide that comes to her house daily.  She denies chest pain or fever.  She denies cough.  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  She is currently on multiple antibiotics (micafungin 100 mg every 24 hours, vancomycin [goal AUC/MIC 400-600] and levofloxacin 750 mg IV every 24 hours per Dr. Hale Bogus notes) through a PICC line for osteomyelitis.  She is not currently on anticoagulation.   Past Medical History:  Diagnosis Date   Acute osteomyelitis of ankle or foot, left ([place]) 01/31/2018   Alveolar hypoventilation    Anemia    not on iron pill   Asthma    Bipolar 2 disorder ([name])    Carpal tunnel syndrome on right    recurrent   Cellulitis 08/2010-08/2011   Chronic pain    COPD (chronic obstructive pulmonary disease) ([name])    Symbicort daily and Proventil as needed   Costochondritis    Depression    Diabetes mellitus type II, uncontrolled 2000   Type 2, Uncontrolled.Takes Lantus daily.Fasting blood sugar runs 150   Drug-seeking behavior    GERD (gastroesophageal reflux disease)     takes Pantoprazole and Zantac daily   HLD (hyperlipidemia)    takes Atorvastatin daily   Hypertension    takes Lisinopril and Coreg daily   Morbid obesity ([name])    Nocturia    OSA on CPAP    Peripheral neuropathy    takes Gabapentin daily   Pneumonia    "walking" several yrs ago and as a baby (12/05/2018)   Rectal fissure    Restless leg    SVT (supraventricular tachycardia) ([name])    Syncope 02/25/2016   Urinary frequency    Urinary incontinence 10/23/2020   Varicose veins    Right medial thigh and Left leg     Past Surgical History:  Procedure Laterality Date   AMPUTATION Left 02/01/2018   Procedure: LEFT FOURTH AND 5TH TOE RAY AMPUTATION;  Surgeon: [name], MD;  Location: [facility];  Service: Orthopedics;  Laterality: Left;   AMPUTATION Left 03/03/2018   Procedure: LEFT BELOW KNEE AMPUTATION;  Surgeon: [name], MD;  Location: [facility];  Service: Orthopedics;  Laterality: Left;   CARPAL TUNNEL RELEASE Bilateral    CESAREAN SECTION  2007   CORONARY ANGIOGRAPHY N/A 11/26/2020   Procedure: CORONARY ANGIOGRAPHY;  Surgeon: Sherren Mocha, MD;  Location: [facility];  Service: Cardiovascular;  Laterality: N/A;   INCISION AND DRAINAGE ABSCESS Left 10/16/2021   Procedure: INCISION AND DRAINAGE  ABSCESS;  Surgeon: Dwan Bolt, MD;  Location: [facility];  Service: General;  Laterality: Left;   INCISION AND DRAINAGE PERIRECTAL ABSCESS Left 05/18/2019   Procedure: IRRIGATION AND DEBRIDEMENT OF PANNIS ABSCESS, POSSIBLE DEBRIDEMENT OF BUTTOCK WOUND;  Surgeon: Donnie Mesa, MD;  Location: [facility];  Service: General;  Laterality: Left;   IRRIGATION AND DEBRIDEMENT BUTTOCKS Left 05/17/2019   Procedure: IRRIGATION AND DEBRIDEMENT BUTTOCKS;  Surgeon: Donnie Mesa, MD;  Location: [facility];  Service: General;  Laterality: Left;   KNEE ARTHROSCOPY Right 07/17/2010   LEFT HEART CATHETERIZATION WITH CORONARY ANGIOGRAM N/A 07/27/2012   Procedure: LEFT HEART CATHETERIZATION WITH CORONARY  ANGIOGRAM;  Surgeon: Sherren Mocha, MD;  Location: [facility];  Service: Cardiovascular;  Laterality: N/A;   MASS EXCISION N/A 06/29/2013   Procedure:  WIDE LOCAL EXCISION OF POSTERIOR NECK ABSCESS;  Surgeon: Ralene Ok, MD;  Location: [facility];  Service: General;  Laterality: N/A;   REPAIR KNEE LIGAMENT Left    "fixed ligaments and chipped patella"   right transmetatarsal amputation       Family History  Problem Relation Age of Onset   Diabetes Mother    Hyperlipidemia Mother    Depression Mother    GER disease Mother    Allergic rhinitis Mother    Restless legs syndrome Mother    Heart attack Paternal Uncle    Heart disease Paternal Grandmother    Heart attack Paternal Grandmother    Heart attack Paternal Grandfather    Heart disease Paternal Grandfather    Heart attack Father    Migraines Sister    Cancer Maternal Grandmother        COLON   Hypertension Maternal Grandmother    Hyperlipidemia Maternal Grandmother    Diabetes Maternal Grandmother    Other Maternal Grandfather        GUN SHOT   Anxiety disorder Sister    Asthma Child     Social History   Tobacco Use   Smoking status: Former    Packs/day: 0.25    Years: 15.00    Pack years: 3.75    Types: Cigarettes    Quit date: 12/06/2005    Years since quitting: 16.1   Smokeless tobacco: Never   Tobacco comments:    smokes for a couple of months  Vaping Use   Vaping Use: Never used  Substance Use Topics   Alcohol use: No    Alcohol/week: 0.0 standard drinks   Drug use: Not Currently    Comment: OD attempts on home meds      Prior to Admission medications   Medication Sig Start Date End Date Taking? Authorizing Provider  albuterol (VENTOLIN HFA) 108 (90 Base) MCG/ACT inhaler INHALE 1 TO 2 PUFFS BY MOUTH EVERY SIX HOURS AS NEEDED FOR WHEEZING OR SHORTNESS OF BREATH Patient taking differently: Inhale 1 puff into the lungs every 6 (six) hours as needed for shortness of breath. 07/16/21  Yes [name], MD  aspirin EC 81 MG tablet Take 1 tablet (81 mg total) by mouth daily. Swallow whole. 11/19/20  Yes Sherren Mocha, MD  Cholecalciferol 1000 units tablet Take 1 tablet (1,000 Units total) by mouth daily. 04/05/17  Yes [name], MD  gabapentin (NEURONTIN) 300 MG capsule Take 3 capsules (900 mg total) by mouth 3 (three) times daily. Patient taking differently: Take 600 mg by mouth 3 (three) times daily. 12/21/21  Yes Gerrit Heck, MD  insulin aspart (NOVOLOG FLEXPEN) 100 UNIT/ML FlexPen Inject 20 Units into  the skin 3 (three) times daily with meals. Patient taking differently: Inject 30 Units into the skin 3 (three) times daily with meals. Sliding scale 10/25/21  Yes Brimage, Vondra, DO  insulin degludec (TRESIBA FLEXTOUCH) 200 UNIT/ML FlexTouch Pen Inject 46 Units into the skin 2 (two) times daily. Patient taking differently: Inject 30 Units into the skin 2 (two) times daily. 10/25/21  Yes Brimage, Vondra, DO  oxyCODONE-acetaminophen (PERCOCET) 7.5-325 MG tablet Take 1 tablet by mouth every 8 (eight) hours as needed for severe pain. 02/03/22  Yes Dickie La, MD  potassium chloride SA (KLOR-CON M20) 20 MEQ tablet Take 1 tablet (20 mEq total) by mouth daily. 09/09/20  Yes Weaver, Scott T, PA-C  ramelteon (ROZEREM) 8 MG tablet TAKE 1 TABLET BY MOUTH AT BEDTIME Patient taking differently: Take 8 mg by mouth at bedtime. 12/02/21  Yes Martyn Malay, MD  SYMBICORT 160-4.5 MCG/ACT inhaler inhale 2 PUFFS into THE lungs 2 TIMES DAILY 01/04/22  Yes Hensel, Jamal Collin, MD  terconazole (TERAZOL 7) 0.4 % vaginal cream Place 1 applicator vaginally at bedtime. 12/18/21  Yes Martyn Malay, MD  Tiotropium Bromide Monohydrate (SPIRIVA RESPIMAT) 1.25 MCG/ACT AERS Inhale 2 puffs into the lungs daily. 11/14/20  Yes Martyn Ehrich, NP  torsemide (DEMADEX) 20 MG tablet TAKE 4 TABLETS BY MOUTH ONCE A DAY Patient taking differently: Take 80 mg by mouth daily. 11/25/21  Yes Lind Covert, MD   TRULICITY 1.5 CM/0.3KJ SOPN inject 1.44m into THE SKIN ONCE A WEEK Patient taking differently: Inject 1.75 mg as directed every Sunday. 05/01/21  Yes MLeeanne Rio MD  Accu-Chek FastClix Lancets MISC USE DAILY AS INSTRUCTED 05/18/21   MLeeanne Rio MD  ACCU-CHEK GUIDE test strip USE T0 CHECK BLOOD SUGAR 3 TIMES DAILY 11/05/21   MLeeanne Rio MD  Accu-Chek Softclix Lancets lancets Use to check blood glucose four times daily 04/17/20   BMartyn Malay MD  atorvastatin (LIPITOR) 80 MG tablet Take 1 tablet (80 mg total) by mouth daily. Patient not taking: Reported on 12/19/2021 02/11/21   MLeeanne Rio MD  atropine 1 % ophthalmic solution Place 1 drop into the right eye daily. Patient not taking: Reported on 02/05/2022 12/22/21   JGerrit Heck MD  Continuous Blood Gluc Sensor (DEXCOM G6 SENSOR) MISC Inject 1 applicator into the skin as directed. Change sensor every 10 days. 11/05/21   MLeeanne Rio MD  Continuous Blood Gluc Transmit (DEXCOM G6 TRANSMITTER) MISC Inject 1 Device into the skin as directed. Reuse 8 times with sensor changes. 09/03/21   MLeeanne Rio MD  Ferrous Sulfate (IRON) 325 (65 Fe) MG TABS Take 1 tablet (325 mg total) by mouth every other day. Patient not taking: Reported on 12/19/2021 03/31/21   MLeeanne Rio MD  Glucagon (BAQSIMI ONE PACK) 3 MG/DOSE POWD Place 3 mg into the nose as needed (low blood sugar).    [provider]  Glucagon, rDNA, (GLUCAGON EMERGENCY) 1 MG KIT Inject 1 mg into the muscle as needed for low blood sugar. 11/03/21   [provider]  GNP ULTICARE PEN NEEDLES 32G X 4 MM MISC USE TO inject insulin 2 TIMES DAILY 10/02/21   MLeeanne Rio MD  Misc. Devices (TRANSFER BENCH) MISC Use daily 10/29/20   MLeeanne Rio MD  prednisoLONE acetate (PRED FORTE) 1 % ophthalmic suspension Place 1 drop into the right eye every 2 (two) hours while awake. Patient not taking: Reported on 02/05/2022  12/21/21  Gerrit Heck, MD    Allergies Cefepime, Kiwi extract, Morphine and related, Nalbuphine, Trental [pentoxifylline], Toradol [ketorolac tromethamine], Morphine, and Nubain [nalbuphine hcl]   REVIEW OF SYSTEMS  Negative except as noted here or in the History of Present Illness.   PHYSICAL EXAMINATION  Initial Vital Signs Blood pressure 125/65, pulse (!) 109, temperature 97.7 F (36.5 C), temperature source Oral, resp. rate (!) 23, height '5\' 2"'  (1.575 m), weight (!) 155.6 kg, SpO2 95 %.  Examination General: Well-developed, high BMI female in no acute distress; appearance consistent with age of record HENT: normocephalic; atraumatic Eyes: pupils equal, round and reactive to light; extraocular muscles intact Neck: supple Heart: regular rate and rhythm Lungs: Diminished sounds in right base but no wheezes, rhonchi or rales; pursed lipped exhalations Abdomen: soft; edema of lower pannus; bowel sounds present Extremities: Left BKA; right forefoot amputation with bandage in place Neurologic: Awake, alert and oriented; motor function intact in all extremities and symmetric; no facial droop Skin: Warm and dry Psychiatric: Normal mood and affect   RESULTS  Summary of this visit's results, reviewed and interpreted by myself:   EKG Interpretation  Date/Time:  Friday February 05 2022 01:02:59 EST Ventricular Rate:  109 PR Interval:  156 QRS Duration: 99 QT Interval:  367 QTC Calculation: 495 R Axis:   54 Text Interpretation: Sinus tachycardia Low voltage, precordial leads No significant change was found Confirmed by Jhett Fretwell, Jenny Reichmann [phone]) on 02/05/2022 1:37:28 AM       Laboratory Studies: Results for orders placed or performed during the hospital encounter of 02/05/22 (from the past 24 hour(s))  Basic metabolic panel     Status: Abnormal   Collection Time: 02/05/22  1:10 AM  Result Value Ref Range   Sodium 136 135 - 145 mmol/L   Potassium 3.9 3.5 - 5.1 mmol/L   Chloride  95 (L) 98 - 111 mmol/L   CO2 28 22 - 32 mmol/L   Glucose, Bld 252 (H) 70 - 99 mg/dL   BUN 53 (H) 6 - 20 mg/dL   Creatinine, Ser 1.41 (H) 0.44 - 1.00 mg/dL   Calcium 8.6 (L) 8.9 - 10.3 mg/dL   GFR, Estimated 47 (L) >60 mL/min   Anion gap 13 5 - 15  CBC     Status: Abnormal   Collection Time: 02/05/22  1:10 AM  Result Value Ref Range   WBC 12.0 (H) 4.0 - 10.5 K/uL   RBC 4.07 3.87 - 5.11 MIL/uL   Hemoglobin 12.2 12.0 - 15.0 g/dL   HCT 38.8 36.0 - 46.0 %   MCV 95.3 80.0 - 100.0 fL   MCH 30.0 26.0 - 34.0 pg   MCHC 31.4 30.0 - 36.0 g/dL   RDW 13.6 11.5 - 15.5 %   Platelets 331 150 - 400 K/uL   nRBC 0.0 0.0 - 0.2 %  Brain natriuretic peptide     Status: None   Collection Time: 02/05/22  1:10 AM  Result Value Ref Range   B Natriuretic Peptide 18.7 0.0 - 100.0 pg/mL  Troponin I (High Sensitivity)     Status: None   Collection Time: 02/05/22  1:10 AM  Result Value Ref Range   Troponin I (High Sensitivity) 15 <18 ng/L  Troponin I (High Sensitivity)     Status: None   Collection Time: 02/05/22  5:20 AM  Result Value Ref Range   Troponin I (High Sensitivity) 16 <18 ng/L  D-dimer, quantitative     Status: None   Collection Time: 02/05/22  5:20 AM  Result Value Ref Range   D-Dimer, Quant 0.46 0.00 - 0.50 ug/mL-FEU  Resp Panel by RT-PCR (Flu A&B, Covid) Nasopharyngeal Swab     Status: None   Collection Time: 02/05/22  6:00 AM   Specimen: Nasopharyngeal Swab; Nasopharyngeal(NP) swabs in vial transport medium  Result Value Ref Range   SARS Coronavirus 2 by RT PCR NEGATIVE NEGATIVE   Influenza A by PCR NEGATIVE NEGATIVE   Influenza B by PCR NEGATIVE NEGATIVE   *Note: Due to a large number of results and/or encounters for the requested time period, some results have not been displayed. A complete set of results can be found in Results Review.   Imaging Studies: DG Chest Port 1 View  Result Date: 02/05/2022 CLINICAL DATA:  Shortness of breath. EXAM: PORTABLE CHEST 1 VIEW COMPARISON:   Chest radiograph dated 10/16/2021. FINDINGS: Mild cardiomegaly with mild central vascular congestion. No focal consolidation, pleural effusion, pneumothorax. No acute osseous pathology. IMPRESSION: Mild cardiomegaly with mild central vascular congestion. No focal consolidation. Electronically Signed   By: Anner Crete M.D.   On: 02/05/2022 01:22    ED COURSE and MDM  Nursing notes, initial and subsequent vitals signs, including pulse oximetry, reviewed and interpreted by myself.  Vitals:   02/05/22 0330 02/05/22 0430 02/05/22 0500 02/05/22 0600  BP: 100/83 115/64 (!) 119/54 104/61  Pulse: 98 92 92 92  Resp: '15 12 16 12  ' Temp:      TempSrc:      SpO2: 95% 92% 96% 95%  Weight:      Height:       Medications - No data to display  4:41 AM The cause of the patient's shortness of breath is unclear.  Her lung sounds are clear except for diminished sounds in the right base.  Her chest x-ray is showing some mild central vascular congestion but no focal infiltrate.  Her BNP is only 18.7 which is not consistent with an acute CHF exacerbation.  Her troponin is normal suggesting she has not had a recent coronary event.  Her BUN and creatinine are within her usual elevated range.  She could have a pulmonary embolism but due to her severe orthopnea she is unable to lie supine even briefly for CT angio chest.  We will check a D-dimer but this may be artificially high due to her recent foot surgery.    5:18 AM Dr. Marlowe Sax to admit to hospitalist service.   6:49 AM D-dimer within normal limits.  PROCEDURES  Procedures   ED DIAGNOSES     ICD-10-CM   1. Shortness of breath  R06.02     2. SOB (shortness of breath)  R06.02 DG Chest [facility] Chest [place] [address]         [place], [place], [place] 02/05/22 [phone]

## 2022-02-05 NOTE — Hospital Course (Addendum)
46 year old F with PMH of morbid obesity (BMI 62.74), OSA/OHS on BiPAP at night, chronic hypoxic RF on 2 L, diastolic CHF, IDDM-2, chronic pain syndrome, CKD-3A, anxiety, depression, BPD, PAD, left BKA, and recent hospitalizations at [facility] from 1/19-1/28 for right TMA stump infection for which he underwent surgical debridement and discharged on vancomycin, Levaquin and micafungin for 6 weeks, presenting with worsening shortness of breath and orthopnea for 2 days, and admitted for acute on chronic diastolic CHF.  Started on IV Lasix.  Patient reports good compliance with diuretics but admits to salt indiscretion. ? ?Patient was diuresed with IV Lasix with improvement in his symptoms.  Intake and output was not well captured.  Weight was stable but not reliable.  She feels well and ready to go home.  She is discharged on home torsemide and potassium.  TTE was attempted but not successful due to difficult acoustic window.  She was counseled on sodium and fluid restriction.  Outpatient follow-up with PCP in 1 to 2 weeks or sooner if needed ? ?In regards to right TMA stump infection, surgical wound appears clean and healing.  She was continued on IV vancomycin, Levaquin and micafungin.  CRP and ESR 2.0 and 63 respectively, lower than previous values at [facility].  She has upcoming appointment with infectious disease on 02/09/2022. ? ?Patient has appropriate DME's.  No therapy needed was identified. ?

## 2022-02-05 NOTE — Assessment & Plan Note (Signed)
Body mass index is 62.74 kg/m?. ?-Already on Trulicity. ?-Very difficult situation ?

## 2022-02-05 NOTE — Assessment & Plan Note (Addendum)
Reports using a scooter at baseline.  She has home aide.  No therapy need identified.  She has appropriate DME's. ?

## 2022-02-05 NOTE — Assessment & Plan Note (Addendum)
CPAP as needed and at bedtime. ?

## 2022-02-05 NOTE — Progress Notes (Signed)
Pharmacy Antibiotic Note ? ?[name] is a 46 y.o. female admitted on 02/05/2022 with known TMA stump infection seen by [facility] on outpatient Micafungin + Levaquin + Vancomycin. Pharmacy has been consulted to resume therapy inpatient.  ? ?Per patient report the patient's last doses were at 10AM on 3/2. PTA the patient was taking:  ?- Vancomycin 1500 mg IV every 24 hours ?- Levaquin 750 mg IV every 24 hours ?- Micafungin 150 mg IV every 24 hours ? ?Levels per [facility] notes for Vancomycin show the following: ?- 2/28 VT 10.8, SCr 1.34 ?- 2/21@1000  9.1 (true); AUC 404 1.5 g q24h @ 1000 Continue  ?- 2/14@0900  10.8 (1-hr early); AUC 445 1.5 g q24h @ 1000 Continue  ? ?A level drawn this morning at 0847 was 19 mcg/ml at SCr 1.41, true trough likely closer to 18 mcg/ml - which is oddly elevated from recent trough values for this patient given the similar SCr. Will plan to give a lower dose this AM to keep her on her morning schedule and recheck a VR tomorrow morning.  ? ?Will switch Micafungin to our formulary agent Anidulafungin and continue Levaquin.  ? ?Plan: ?- Vancomycin 1g IV x 1 dose ?- Recheck a VR on 3/4 @ 1000 ?- Continue Levaquin 750 mg IV every 24 hours ?- Eraxis 200 mg IV x 1 followed by 100 mg IV every 24 hours ? ?Height: 5\' 2"  (157.5 cm) ?Weight: (!) 155.6 kg (343 lb 0.6 oz) ?IBW/kg (Calculated) : 50.1 ? ?Temp (24hrs), Avg:97.7 ?F (36.5 ?C), Min:97.7 ?F (36.5 ?C), Max:97.7 ?F (36.5 ?C) ? ?Recent Labs  ?Lab 02/05/22 ?0110 02/05/22 ?0847  ?WBC 12.0*  --   ?CREATININE 1.41*  --   ?[place]  --  19  ?  ?Estimated Creatinine Clearance: 74.2 mL/min (A) (by C-G formula based on SCr of 1.41 mg/dL (H)).   ? ?Allergies  ?Allergen Reactions  ? Cefepime Other (See Comments)  ?  AKI, see records from [facility] hospitalization in January 2020.   ?Other reaction(s): Other (See Comments) ?"Shut down organs/kidneys" ? ?Note pt has tolerated Rocephin and Keflex  ? Kiwi Extract Shortness Of Breath, Swelling and Anaphylaxis  ?  Morphine And Related Nausea And Vomiting  ? Nalbuphine   ?  Other reaction(s): Hallucinations, Other (See Comments) ?"FEELS LIKE SOMETHING CRAWLING ON ME" ?Reaction:  Nervousness ?"FEELS LIKE SOMETHING CRAWLING ON ME" ?Reaction:  Nervousness ?Reaction:  Nervousness ?"FEELS LIKE SOMETHING CRAWLING ON ME" ?  ? Trental [Pentoxifylline] Nausea And Vomiting  ? Toradol [Ketorolac Tromethamine] Other (See Comments)  ?  Feels like something is crawling on me  ? Morphine Nausea And Vomiting  ? Nubain [Nalbuphine Hcl] Other (See Comments)  ?  "FEELS LIKE SOMETHING CRAWLING ON ME"  ? ? ?Antimicrobials this admission: ?PTA Micafungin/LVQ/Vanc ?Levaquin 3/3 >> ?Vancomycin 3/3 >> ?Eraxis 3/3 >> ? ?Microbiology results: ?3/3 COVID/flu >> neg ? ?Thank you for allowing pharmacy to be a part of this patient?s care. ? ?Alycia Rossetti, PharmD, BCPS ?Infectious Diseases Clinical Pharmacist ?02/05/2022 10:48 AM  ? ?**Pharmacist phone directory can now be found on [email or web address] ([email or web address]).  Listed under [place].  ?

## 2022-02-05 NOTE — Assessment & Plan Note (Deleted)
PT/OT eval ?

## 2022-02-05 NOTE — ED Triage Notes (Addendum)
Patient BIB [facility] from home. Complaining of shortness of breath for 2 days. Patient on 2L [place] baseline for CHF and COPD. Saturations around 93%. Diminished in all fields, right side worse than left. Left BKA amputation, right foot surgery. ? ?EMS ?Gave Duoneb ?PICC line on left side for antibiotics for her foot infection that she had surgery on her right foot 3 weeks ago.  ?

## 2022-02-05 NOTE — ED Notes (Signed)
ED TO INPATIENT HANDOFF REPORT  ED Nurse Name and Phone #: Fabio Neighbors RN  S Name/Age/Gender [name] 46 y.o. female Room/Bed: WA09/WA09  Code Status   Code Status: Full Code  Home/SNF/Other Home Patient oriented to: self, place, time, and situation Is this baseline? Yes   Triage Complete: Triage complete  Chief Complaint Acute on chronic diastolic (congestive) heart failure ([name]) [I50.33] Acute on chronic diastolic CHF (congestive heart failure) ([place]) [I50.33]  Triage Note Patient BIB [facility] from home. Complaining of shortness of breath for 2 days. Patient on 2L [place] baseline for CHF and COPD. Saturations around 93%. Diminished in all fields, right side worse than left. Left BKA amputation, right foot surgery.  EMS Gave Duoneb PICC line on left side for antibiotics for her foot infection that she had surgery on her right foot 3 weeks ago.    Allergies Allergies  Allergen Reactions   Cefepime Other (See Comments)    AKI, see records from [place] hospitalization in January 2020.   Other reaction(s): Other (See Comments) "Shut down organs/kidneys"  Note pt has tolerated Rocephin and Keflex   Kiwi Extract Shortness Of Breath, Swelling and Anaphylaxis   Morphine And Related Nausea And Vomiting   Nalbuphine     Other reaction(s): Hallucinations, Other (See Comments) "FEELS LIKE SOMETHING CRAWLING ON ME" Reaction:  Nervousness "FEELS LIKE SOMETHING CRAWLING ON ME" Reaction:  Nervousness Reaction:  Nervousness "FEELS LIKE SOMETHING CRAWLING ON ME"    Trental [Pentoxifylline] Nausea And Vomiting   Toradol [Ketorolac Tromethamine] Other (See Comments)    Feels like something is crawling on me   Morphine Nausea And Vomiting   Nubain [Nalbuphine Hcl] Other (See Comments)    "FEELS LIKE SOMETHING CRAWLING ON ME"    Level of Care/Admitting Diagnosis ED Disposition     ED Disposition  Admit   Condition  --   Comment  Hospital Area: [place]  [[id number]]  Level of Care: Progressive [102]  Admit to Progressive based on following criteria: RESPIRATORY PROBLEMS hypoxemic/hypercapnic respiratory failure that is responsive to NIPPV (BiPAP) or High Flow Nasal Cannula (6-80 lpm). Frequent assessment/intervention, no > Q2 hrs < Q4 hrs, to maintain oxygenation and pulmonary hygiene.  May admit patient to [facility] or [facility] if equivalent level of care is available:: No  Covid Evaluation: Confirmed COVID Negative  Diagnosis: Acute on chronic diastolic CHF (congestive heart failure) [facility]) [[id number]]  Admitting Physician: Mercy Riding [[id number]]  Attending Physician: Mercy Riding [[id number]]  Estimated length of stay: past midnight tomorrow  Certification:: I certify this patient will need inpatient services for at least 2 midnights          B Medical/Surgery History Past Medical History:  Diagnosis Date   Acute osteomyelitis of ankle or foot, left ([place]) 01/31/2018   Alveolar hypoventilation    Anemia    not on iron pill   Asthma    Bipolar 2 disorder ([place])    Carpal tunnel syndrome on right    recurrent   Cellulitis 08/2010-08/2011   Chronic pain    COPD (chronic obstructive pulmonary disease) ([place])    Symbicort daily and Proventil as needed   Costochondritis    Depression    Diabetes mellitus type II, uncontrolled 2000   Type 2, Uncontrolled.Takes Lantus daily.Fasting blood sugar runs 150   Drug-seeking behavior    GERD (gastroesophageal reflux disease)    takes Pantoprazole and Zantac daily   HLD (hyperlipidemia)    takes Atorvastatin daily  Hypertension    takes Lisinopril and Coreg daily   Morbid obesity ([name])    Nocturia    OSA on CPAP    Peripheral neuropathy    takes Gabapentin daily   Pneumonia    "walking" several yrs ago and as a baby (12/05/2018)   Rectal fissure    Restless leg    SVT (supraventricular tachycardia) ([name])    Syncope 02/25/2016   Urinary frequency    Urinary incontinence 10/23/2020    Varicose veins    Right medial thigh and Left leg    Past Surgical History:  Procedure Laterality Date   AMPUTATION Left 02/01/2018   Procedure: LEFT FOURTH AND 5TH TOE RAY AMPUTATION;  Surgeon: [name], MD;  Location: [place];  Service: Orthopedics;  Laterality: Left;   AMPUTATION Left 03/03/2018   Procedure: LEFT BELOW KNEE AMPUTATION;  Surgeon: [name], MD;  Location: [place];  Service: Orthopedics;  Laterality: Left;   CARPAL TUNNEL RELEASE Bilateral    CESAREAN SECTION  2007   CORONARY ANGIOGRAPHY N/A 11/26/2020   Procedure: CORONARY ANGIOGRAPHY;  Surgeon: Sherren Mocha, MD;  Location: [place] [facility];  Service: Cardiovascular;  Laterality: N/A;   INCISION AND DRAINAGE ABSCESS Left 10/16/2021   Procedure: INCISION AND DRAINAGE ABSCESS;  Surgeon: Dwan Bolt, MD;  Location: [place];  Service: General;  Laterality: Left;   INCISION AND DRAINAGE PERIRECTAL ABSCESS Left 05/18/2019   Procedure: IRRIGATION AND DEBRIDEMENT OF PANNIS ABSCESS, POSSIBLE DEBRIDEMENT OF BUTTOCK WOUND;  Surgeon: Donnie Mesa, MD;  Location: [place];  Service: General;  Laterality: Left;   IRRIGATION AND DEBRIDEMENT BUTTOCKS Left 05/17/2019   Procedure: IRRIGATION AND DEBRIDEMENT BUTTOCKS;  Surgeon: Donnie Mesa, MD;  Location: [place];  Service: General;  Laterality: Left;   KNEE ARTHROSCOPY Right 07/17/2010   LEFT HEART CATHETERIZATION WITH CORONARY ANGIOGRAM N/A 07/27/2012   Procedure: LEFT HEART CATHETERIZATION WITH CORONARY ANGIOGRAM;  Surgeon: Sherren Mocha, MD;  Location: [facility];  Service: Cardiovascular;  Laterality: N/A;   MASS EXCISION N/A 06/29/2013   Procedure:  WIDE LOCAL EXCISION OF POSTERIOR NECK ABSCESS;  Surgeon: Ralene Ok, MD;  Location: [place];  Service: General;  Laterality: N/A;   REPAIR KNEE LIGAMENT Left    "fixed ligaments and chipped patella"   right transmetatarsal amputation        A IV Location/Drains/Wounds Patient Lines/Drains/Airways Status     Active  Line/Drains/Airways     Name Placement date Placement time Site Days   Peripheral IV 02/05/22 20 G Posterior;Right Forearm 02/05/22  0500  Forearm  less than 1   Wound / Incision (Open or Dehisced) 12/19/21 Incision - Open Thigh Anterior;Left;Proximal 12/19/21  1836  Thigh  48   Wound / Incision (Open or Dehisced) 12/19/21 Diabetic ulcer Foot Anterior;Right 12/19/21  1838  Foot  48   Wound / Incision (Open or Dehisced) 12/19/21 (MASD) Moisture Associated Skin Damage Buttocks Bilateral Red Blanching 12/19/21  1839  Buttocks  48   Wound / Incision (Open or Dehisced) 12/19/21 Other (Comment) Sacrum Small pink/red open area 12/19/21  1839  Sacrum  48            Intake/Output Last 24 hours  Intake/Output Summary (Last 24 hours) at 02/05/2022 2011 Last data filed at 02/05/2022 1431 Gross per 24 hour  Intake 550 ml  Output --  Net 550 ml    Labs/Imaging Results for orders placed or performed during the hospital encounter of 02/05/22 (from the past 48 hour(s))  Basic metabolic panel     Status: Abnormal   Collection Time: 02/05/22  1:10 AM  Result Value Ref Range   Sodium 136 135 - 145 mmol/L   Potassium 3.9 3.5 - 5.1 mmol/L   Chloride 95 (L) 98 - 111 mmol/L   CO2 28 22 - 32 mmol/L   Glucose, Bld 252 (H) 70 - 99 mg/dL    Comment: Glucose reference range applies only to samples taken after fasting for at least 8 hours.   BUN 53 (H) 6 - 20 mg/dL   Creatinine, Ser 1.41 (H) 0.44 - 1.00 mg/dL   Calcium 8.6 (L) 8.9 - 10.3 mg/dL   GFR, Estimated 47 (L) >60 mL/min    Comment: (NOTE) Calculated using the CKD-EPI Creatinine Equation (2021)    Anion gap 13 5 - 15    Comment: Performed at [facility], [place] [address], [place], [place] [address]  CBC     Status: Abnormal   Collection Time: 02/05/22  1:10 AM  Result Value Ref Range   WBC 12.0 (H) 4.0 - 10.5 K/uL   RBC 4.07 3.87 - 5.11 MIL/uL   Hemoglobin 12.2 12.0 - 15.0 g/dL   HCT 38.8 36.0 - 46.0 %   MCV 95.3 80.0 -  100.0 fL   MCH 30.0 26.0 - 34.0 pg   MCHC 31.4 30.0 - 36.0 g/dL   RDW 13.6 11.5 - 15.5 %   Platelets 331 150 - 400 K/uL   nRBC 0.0 0.0 - 0.2 %    Comment: Performed at [facility], [place] [address], [place], [place] [address]  Brain natriuretic peptide     Status: None   Collection Time: 02/05/22  1:10 AM  Result Value Ref Range   B Natriuretic Peptide 18.7 0.0 - 100.0 pg/mL    Comment: Performed at [facility], [place] [address], [place], [place] [address]  Troponin I (High Sensitivity)     Status: None   Collection Time: 02/05/22  1:10 AM  Result Value Ref Range   Troponin I (High Sensitivity) 15 <18 ng/L    Comment: (NOTE) Elevated high sensitivity troponin I (hsTnI) values and significant  changes across serial measurements may suggest ACS but many other  chronic and acute conditions are known to elevate hsTnI results.  Refer to the "Links" section for chest pain algorithms and additional  guidance. Performed at [facility], [place] [address], [place], [place] [address]   Troponin I (High Sensitivity)     Status: None   Collection Time: 02/05/22  5:20 AM  Result Value Ref Range   Troponin I (High Sensitivity) 16 <18 ng/L    Comment: (NOTE) Elevated high sensitivity troponin I (hsTnI) values and significant  changes across serial measurements may suggest ACS but many other  chronic and acute conditions are known to elevate hsTnI results.  Refer to the "Links" section for chest pain algorithms and additional  guidance. Performed at [facility], [place] [address], [place], [place] [address]   D-dimer, quantitative     Status: None   Collection Time: 02/05/22  5:20 AM  Result Value Ref Range   D-Dimer, Quant 0.46 0.00 - 0.50 ug/mL-FEU    Comment: (NOTE) At the manufacturer cut-off value of 0.5 g/mL FEU, this assay has a negative predictive value of 95-100%.This assay is intended for use in conjunction  with a clinical pretest probability (PTP) assessment model to exclude pulmonary embolism (PE) and deep venous  thrombosis (DVT) in outpatients suspected of PE or DVT. Results should be correlated with clinical presentation. Performed at [facility], [place] [address], [place], [place] [address]   Resp Panel by RT-PCR (Flu A&B, Covid) Nasopharyngeal Swab     Status: None   Collection Time: 02/05/22  6:00 AM   Specimen: Nasopharyngeal Swab; Nasopharyngeal(NP) swabs in vial transport medium  Result Value Ref Range   SARS Coronavirus 2 by RT PCR NEGATIVE NEGATIVE    Comment: (NOTE) SARS-CoV-2 target nucleic acids are NOT DETECTED.  The SARS-CoV-2 RNA is generally detectable in upper respiratory specimens during the acute phase of infection. The lowest concentration of SARS-CoV-2 viral copies this assay can detect is 138 copies/mL. A negative result does not preclude SARS-Cov-2 infection and should not be used as the sole basis for treatment or other patient management decisions. A negative result may occur with  improper specimen collection/handling, submission of specimen other than nasopharyngeal swab, presence of viral mutation(s) within the areas targeted by this assay, and inadequate number of viral copies(<138 copies/mL). A negative result must be combined with clinical observations, patient history, and epidemiological information. The expected result is Negative.  Fact Sheet for Patients:  EntrepreneurPulse.com.au  Fact Sheet for Healthcare Providers:  IncredibleEmployment.be  This test is no t yet approved or cleared by the [place] FDA and  has been authorized for detection and/or diagnosis of SARS-CoV-2 by FDA under an Emergency Use Authorization (EUA). This EUA will remain  in effect (meaning this test can be used) for the duration of the COVID-19 declaration under Section 564(b)(1) of the Act, 21 U.S.C.section  360bbb-3(b)(1), unless the authorization is terminated  or revoked sooner.       Influenza A by PCR NEGATIVE NEGATIVE   Influenza B by PCR NEGATIVE NEGATIVE    Comment: (NOTE) The Xpert Xpress SARS-CoV-2/FLU/RSV plus assay is intended as an aid in the diagnosis of influenza from Nasopharyngeal swab specimens and should not be used as a sole basis for treatment. Nasal washings and aspirates are unacceptable for Xpert Xpress SARS-CoV-2/FLU/RSV testing.  Fact Sheet for Patients: EntrepreneurPulse.com.au  Fact Sheet for Healthcare Providers: IncredibleEmployment.be  This test is not yet approved or cleared by the [place] FDA and has been authorized for detection and/or diagnosis of SARS-CoV-2 by FDA under an Emergency Use Authorization (EUA). This EUA will remain in effect (meaning this test can be used) for the duration of the COVID-19 declaration under Section 564(b)(1) of the Act, 21 U.S.C. section 360bbb-3(b)(1), unless the authorization is terminated or revoked.  Performed at [facility], [place] [address] [place], [place] [address]   CBG monitoring, ED     Status: Abnormal   Collection Time: 02/05/22  7:50 AM  Result Value Ref Range   Glucose-Capillary 196 (H) 70 - 99 mg/dL    Comment: Glucose reference range applies only to samples taken after fasting for at least 8 hours.  Blood gas, venous     Status: Abnormal   Collection Time: 02/05/22  8:35 AM  Result Value Ref Range   pH, Ven 7.36 7.25 - 7.43   pCO2, Ven 52 44 - 60 mmHg   pO2, Ven 64 (H) 32 - 45 mmHg   Bicarbonate 29.4 (H) 20.0 - 28.0 mmol/L   Acid-Base Excess 3.1 (H) 0.0 - 2.0 mmol/L   O2 Saturation 93.7 %   Patient temperature 37.0     Comment: Performed at [facility], [place] [name] [place], [place]  [address]  Vancomycin, trough     Status: None   Collection Time: 02/05/22  8:47 AM  Result Value Ref Range   Vancomycin Tr 19  15 - 20 ug/mL    Comment: Performed at [facility], [place] [address] [place], [place] [address]  CBG monitoring, ED     Status: Abnormal   Collection Time: 02/05/22 11:54 AM  Result Value Ref Range   Glucose-Capillary 269 (H) 70 - 99 mg/dL    Comment: Glucose reference range applies only to samples taken after fasting for at least 8 hours.  CBG monitoring, ED     Status: Abnormal   Collection Time: 02/05/22  5:42 PM  Result Value Ref Range   Glucose-Capillary 243 (H) 70 - 99 mg/dL    Comment: Glucose reference range applies only to samples taken after fasting for at least 8 hours.   *Note: Due to a large number of results and/or encounters for the requested time period, some results have not been displayed. A complete set of results can be found in Results Review.   DG Chest Port 1 View  Result Date: 02/05/2022 CLINICAL DATA:  Shortness of breath. EXAM: PORTABLE CHEST 1 VIEW COMPARISON:  Chest radiograph dated 10/16/2021. FINDINGS: Mild cardiomegaly with mild central vascular congestion. No focal consolidation, pleural effusion, pneumothorax. No acute osseous pathology. IMPRESSION: Mild cardiomegaly with mild central vascular congestion. No focal consolidation. Electronically Signed   By: Anner Crete M.D.   On: 02/05/2022 01:22    Pending Labs Unresulted Labs (From admission, onward)     Start     Ordered   02/06/22 1000  Vancomycin, random  Once-Timed,   R        02/05/22 1101   02/06/22 0500  CBC  Tomorrow morning,   R        02/05/22 0739   02/06/22 0500  Renal function panel  Tomorrow morning,   R        02/05/22 1619   02/06/22 0500  Magnesium  Tomorrow morning,   R        02/05/22 1619            Vitals/Pain Today's Vitals   02/05/22 1441 02/05/22 1849 02/05/22 1852 02/05/22 1900  BP: 105/66 107/66  125/89  Pulse: 88 97  95  Resp: 18 18    Temp:   97.9 F (36.6 C)   TempSrc:   Oral   SpO2: 95% 97%  96%  Weight:      Height:      PainSc:         Isolation Precautions No active isolations  Medications Medications  aspirin EC tablet 81 mg (81 mg Oral Given 02/05/22 1044)  gabapentin (NEURONTIN) capsule 600 mg (600 mg Oral Given 02/05/22 1623)  mometasone-formoterol (DULERA) 200-5 MCG/ACT inhaler 2 puff (2 puffs Inhalation Given 02/05/22 0820)  Tiotropium Bromide Monohydrate AERS 2 puff (has no administration in time range)  albuterol (PROVENTIL) (2.5 MG/3ML) 0.083% nebulizer solution 3 mL (has no administration in time range)  enoxaparin (LOVENOX) injection 40 mg (40 mg Subcutaneous Given 02/05/22 1044)  acetaminophen (TYLENOL) tablet 650 mg (has no administration in time range)    Or  acetaminophen (TYLENOL) suppository 650 mg (has no administration in time range)  furosemide (LASIX) injection 80 mg (80 mg Intravenous Given 02/05/22 1852)  anidulafungin (ERAXIS) 200 mg in sodium chloride 0.9 % 200 mL IVPB (0 mg Intravenous Stopped 02/05/22 1431)    Followed by  anidulafungin (  ERAXIS) 100 mg in sodium chloride 0.9 % 100 mL IVPB (has no administration in time range)  levofloxacin (LEVAQUIN) IVPB 750 mg (0 mg Intravenous Stopped 02/05/22 1431)  insulin glargine-yfgn (SEMGLEE) injection 46 Units (has no administration in time range)  insulin aspart (novoLOG) injection 0-20 Units (7 Units Subcutaneous Given 02/05/22 1852)  insulin aspart (novoLOG) injection 0-5 Units (has no administration in time range)  insulin aspart (novoLOG) injection 4 Units (4 Units Subcutaneous Given 02/05/22 1853)  vancomycin (VANCOCIN) IVPB 1000 mg/200 mL premix (0 mg Intravenous Stopped 02/05/22 1235)    Mobility walks with device Moderate fall risk   Focused Assessments    R Recommendations: See Admitting Provider Note  Report given to:   Additional Notes:

## 2022-02-05 NOTE — ED Notes (Signed)
Report given to receiving nurse

## 2022-02-05 NOTE — Assessment & Plan Note (Addendum)
Admitted in January 2023 for this problem and MRI done at that time was suspicious for early acute osteomyelitis.  Underwent surgical debridement at [facility] and discharged with PICC line on vancomycin, Levaquin, and micafungin x6 weeks (end of therapy 02/09/2022). Seen by ID at [facility] on 2/23 and recommendation was to continue the same regimen.  TMA stump appears clean on my exam.  See picture under media.  CRP 2.0.  ESR 63.  Both lower than prior values. ?-Continue antibiotics and antifungal agent and 02/09/2022. ?-Patient has upcoming follow-up with ID on 3/7 ? ?

## 2022-02-05 NOTE — Assessment & Plan Note (Addendum)
Stable. ?-Continue home diuretics. ?

## 2022-02-05 NOTE — Progress Notes (Signed)
PROGRESS NOTE  [name] JXB:[id number] DOB: 03/05/1976   PCP: [name], MD  Patient is from: Home.  Lives with his 44 year old child.  She has home health aide  DOA: 02/05/2022 LOS: 0  Chief complaints:  Chief Complaint  Patient presents with   Shortness of Breath     Brief Narrative / Interim history: 46 year old F with PMH of morbid obesity (BMI 62.74), OSA/OHS on BiPAP at night, chronic hypoxic RF on 2 L, diastolic CHF, IDDM-2, chronic pain syndrome, CKD-3A, anxiety, depression, BPD, PAD, left BKA, and recent hospitalizations at [facility] from 1/19-1/28 for right TMA stump infection for which he underwent surgical debridement and discharged on vancomycin, Levaquin and micafungin for 6 weeks, presenting with worsening shortness of breath and orthopnea for 2 days, and admitted for acute on chronic diastolic CHF.  Started on IV Lasix.  Patient reports good compliance with diuretics but admits to salt indiscretion.    Subjective: Seen and examined earlier this morning.  Patient was admitted earlier this morning.  Aide at bedside.  Patient complains shortness of breath despite good saturation.  She denies chest pain, nausea or vomiting.  She likes to be put on BiPAP.  Objective: Vitals:   02/05/22 0500 02/05/22 0600 02/05/22 0700 02/05/22 1441  BP: (!) 119/54 104/61 111/68 105/66  Pulse: 92 92 88 88  Resp: 16 12 (!) 21 18  Temp:      TempSrc:      SpO2: 96% 95% 96% 95%  Weight:      Height:        Examination:  GENERAL: No apparent distress.  Nontoxic. HEENT: MMM.  Vision and hearing grossly intact.  NECK: Supple.  Difficult to assess JVD due to body habitus. RESP: Pursed breathing.  Fair aeration but limited exam due to body habitus. CVS:  RRR. Heart sounds normal.  ABD/GI/GU: BS+. Abd soft, NTND.  MSK/EXT:  Moves extremities.  Left BKA.  Right TMA stump appears clean and dry. SKIN: As above. NEURO: Awake, alert and oriented appropriately.  No apparent focal neuro  deficit. PSYCH: Calm. Normal affect.      Procedures:  None  Microbiology summarized: [id number] and influenza PCR nonreactive.  Assessment and Plan: * Acute on chronic diastolic (congestive) heart failure ([name]) TTE in 02/2021 with LVEF of 55 to 60% but limited exam due to suboptimal acoustic window.  Presented with dyspnea and orthopnea.  Reports compliance with diuretics but admits to salt indiscretion.  CXR shows mild central vascular congestion.  Very difficult to assess fluid status given morbid obesity with BMI of 62.74.  Other causes include OSA/OHS.  PE unlikely with negative D-dimer.  Low suspicion for infectious process while on broad-spectrum antibiotics.  Does not look like asthma exacerbation. -Continue IV Lasix 80 mg twice daily.   -Monitor renal function, intake and output, and daily weights.  -Follow repeat echocardiogram. -Sodium and fluid restriction -BiPAP as needed for respiratory distress.  Needs progressive unit  Chronic respiratory failure with hypoxia ([name]) On 2 L during the day and BiPAP at night at baseline.  VBG reassuring.  Continues to endorse difficulty breathing despite good oxygen saturation. -Escalate care to progressive unit for BiPAP -Minimum oxygen to keep saturation above 90%. -Manage CHF as above -Bronchodilators for asthma   Diabetic ulcer of right foot [facility]) Admitted in January 2023 for this problem and MRI done at that time was suspicious for early acute osteomyelitis.  Underwent surgical debridement at [facility] and discharged with PICC line on vancomycin, Levaquin,  and micafungin x6 weeks (end of therapy 02/09/2022). Seen by ID at [facility] on 2/23 and recommendation was to continue the same regimen.  TMA stump appears clean on my exam.  See picture under media -Continue antibiotics and antifungal agent.   -Outpatient ID follow-up.  Chronic kidney disease, stage 3a ([place]) Recent Labs    10/22/21 0211 10/23/21 0219 10/24/21 0546 10/25/21 0610  11/04/21 1105 12/18/21 2328 12/19/21 0811 12/20/21 0238 12/21/21 0157 02/05/22 0110  BUN 56* 52* 49* 37* 53* 53* 69* 48* 38* 53*  CREATININE 4.39* 3.05* 2.38* 1.44* 1.38* 1.76* 1.40* 1.62* 1.18* 1.41*  Creatinine seems to be at baseline. Continue monitoring   Leukocytosis Mild and likely reactive.  No infectious signs or symptoms.  Right TMA stump wound appears clean and dry. -Continue to monitor  Insulin dependent type 2 diabetes mellitus ([place]) Uncontrolled IDDM-2 with hyperglycemia and other complications.  A1c persistently > 15.5%.  On Tresiba 46 units twice daily, NovoLog 20 units 3 times daily and Trulicity at home Recent Labs  Lab 02/05/22 0750 02/05/22 1154  GLUCAP 196* 269*  -Increase basal insulin to 46 units twice daily -Increase SSI to resistant/obese -Add NovoLog 4 units 3 times daily with meals -Start statin if no contraindication -Continue home gabapentin -Check hemoglobin A1c   Debility Lives with 12 years old child.  She has home aide. -PT/OT eval  Bilateral knee pain PT/OT eval  Asthma, chronic Stable, no wheezing -Continue home inhalers  Hypertension Stable. -Continue IV Lasix and monitor blood pressure  OSA (obstructive sleep apnea) BiPAP as needed and at bedtime.  Obesity hypoventilation syndrome ([name]) BiPAP as needed and at bedtime.  Morbid obesity ([place]) Body mass index is 62.74 kg/m. -Already on Trulicity. -Very difficult situation           DVT prophylaxis:  enoxaparin (LOVENOX) injection 40 mg Start: 02/05/22 1000  Code Status: Full code Family Communication: Patient and/or RN. Available if any question.  Level of care: Progressive Status is: Inpatient Remains inpatient appropriate because: Due to acute on chronic diastolic CHF requiring IV diuretics and BiPAP as needed     Final disposition: TBD      Consultants:  None   Sch Meds:  Scheduled Meds:  aspirin EC  81 mg Oral Daily   enoxaparin (LOVENOX)  injection  40 mg Subcutaneous Q12H   furosemide  80 mg Intravenous BID   gabapentin  600 mg Oral TID   insulin aspart  0-20 Units Subcutaneous TID WC   insulin aspart  0-5 Units Subcutaneous QHS   insulin aspart  4 Units Subcutaneous TID WC   insulin glargine-yfgn  46 Units Subcutaneous BID   mometasone-formoterol  2 puff Inhalation BID   Tiotropium Bromide Monohydrate  2 puff Inhalation Daily   Continuous Infusions:  [START ON 02/06/2022] anidulafungin     levofloxacin (LEVAQUIN) IV Stopped (02/05/22 1431)   PRN Meds:.acetaminophen **OR** acetaminophen, albuterol  Antimicrobials: Anti-infectives (From admission, onward)    Start     Dose/Rate Route Frequency Ordered Stop   02/06/22 1000  anidulafungin (ERAXIS) 100 mg in sodium chloride 0.9 % 100 mL IVPB       See Hyperspace for full Linked Orders Report.   100 mg 78 mL/hr over 100 Minutes Intravenous Every 24 hours 02/05/22 0850     02/05/22 1100  vancomycin (VANCOCIN) IVPB 1000 mg/200 mL premix        1,000 mg 200 mL/hr over 60 Minutes Intravenous  Once 02/05/22 1054 02/05/22 1235   02/05/22  1000  anidulafungin (ERAXIS) 200 mg in sodium chloride 0.9 % 200 mL IVPB       See Hyperspace for full Linked Orders Report.   200 mg 78 mL/hr over 200 Minutes Intravenous  Once 02/05/22 0850 02/05/22 1412   02/05/22 1000  levofloxacin (LEVAQUIN) IVPB 750 mg        750 mg 100 mL/hr over 90 Minutes Intravenous Every 24 hours 02/05/22 0850     02/05/22 1000  vancomycin (VANCOREADY) IVPB 1500 mg/300 mL  Status:  Discontinued        1,500 mg 150 mL/hr over 120 Minutes Intravenous Daily 02/05/22 0929 02/05/22 1021        I have personally reviewed the following labs and images: CBC: Recent Labs  Lab 02/05/22 0110  WBC 12.0*  HGB 12.2  HCT 38.8  MCV 95.3  PLT 331   BMP &GFR Recent Labs  Lab 02/05/22 0110  NA 136  K 3.9  CL 95*  CO2 28  GLUCOSE 252*  BUN 53*  CREATININE 1.41*  CALCIUM 8.6*   Estimated Creatinine  Clearance: 74.2 mL/min (A) (by C-G formula based on SCr of 1.41 mg/dL (H)). Liver & Pancreas: No results for input(s): AST, ALT, ALKPHOS, BILITOT, PROT, ALBUMIN in the last 168 hours. No results for input(s): LIPASE, AMYLASE in the last 168 hours. No results for input(s): AMMONIA in the last 168 hours. Diabetic: No results for input(s): HGBA1C in the last 72 hours. Recent Labs  Lab 02/05/22 0750 02/05/22 1154  GLUCAP 196* 269*   Cardiac Enzymes: No results for input(s): CKTOTAL, CKMB, CKMBINDEX, TROPONINI in the last 168 hours. No results for input(s): PROBNP in the last 8760 hours. Coagulation Profile: No results for input(s): INR, PROTIME in the last 168 hours. Thyroid Function Tests: No results for input(s): TSH, T4TOTAL, FREET4, T3FREE, THYROIDAB in the last 72 hours. Lipid Profile: No results for input(s): CHOL, HDL, LDLCALC, TRIG, CHOLHDL, LDLDIRECT in the last 72 hours. Anemia Panel: No results for input(s): VITAMINB12, FOLATE, FERRITIN, TIBC, IRON, RETICCTPCT in the last 72 hours. Urine analysis:    Component Value Date/Time   COLORURINE YELLOW 12/19/2021 0932   APPEARANCEUR CLEAR 12/19/2021 0932   LABSPEC 1.010 12/19/2021 0932   PHURINE 5.0 12/19/2021 0932   GLUCOSEU >=500 (A) 12/19/2021 0932   HGBUR TRACE (A) 12/19/2021 0932   BILIRUBINUR NEGATIVE 12/19/2021 0932   BILIRUBINUR negative 09/29/2021 1220   KETONESUR NEGATIVE 12/19/2021 0932   PROTEINUR NEGATIVE 12/19/2021 0932   UROBILINOGEN 0.2 09/29/2021 1220   UROBILINOGEN 0.2 08/22/2015 0225   NITRITE NEGATIVE 12/19/2021 0932   LEUKOCYTESUR TRACE (A) 12/19/2021 0932   Sepsis Labs: Invalid input(s): PROCALCITONIN, [place]  Microbiology: Recent Results (from the past 240 hour(s))  Resp Panel by RT-PCR (Flu A&B, Covid) Nasopharyngeal Swab     Status: None   Collection Time: 02/05/22  6:00 AM   Specimen: Nasopharyngeal Swab; Nasopharyngeal(NP) swabs in vial transport medium  Result Value Ref Range Status    SARS Coronavirus 2 by RT PCR NEGATIVE NEGATIVE Final    Comment: (NOTE) SARS-CoV-2 target nucleic acids are NOT DETECTED.  The SARS-CoV-2 RNA is generally detectable in upper respiratory specimens during the acute phase of infection. The lowest concentration of SARS-CoV-2 viral copies this assay can detect is 138 copies/mL. A negative result does not preclude SARS-Cov-2 infection and should not be used as the sole basis for treatment or other patient management decisions. A negative result may occur with  improper specimen collection/handling, submission of specimen other  than nasopharyngeal swab, presence of viral mutation(s) within the areas targeted by this assay, and inadequate number of viral copies(<138 copies/mL). A negative result must be combined with clinical observations, patient history, and epidemiological information. The expected result is Negative.  Fact Sheet for Patients:  EntrepreneurPulse.com.au  Fact Sheet for Healthcare Providers:  IncredibleEmployment.be  This test is no t yet approved or cleared by the [place] FDA and  has been authorized for detection and/or diagnosis of SARS-CoV-2 by FDA under an Emergency Use Authorization (EUA). This EUA will remain  in effect (meaning this test can be used) for the duration of the COVID-19 declaration under Section 564(b)(1) of the Act, 21 U.S.C.section 360bbb-3(b)(1), unless the authorization is terminated  or revoked sooner.       Influenza A by PCR NEGATIVE NEGATIVE Final   Influenza B by PCR NEGATIVE NEGATIVE Final    Comment: (NOTE) The Xpert Xpress SARS-CoV-2/FLU/RSV plus assay is intended as an aid in the diagnosis of influenza from Nasopharyngeal swab specimens and should not be used as a sole basis for treatment. Nasal washings and aspirates are unacceptable for Xpert Xpress SARS-CoV-2/FLU/RSV testing.  Fact Sheet for  Patients: EntrepreneurPulse.com.au  Fact Sheet for Healthcare Providers: IncredibleEmployment.be  This test is not yet approved or cleared by the [place] FDA and has been authorized for detection and/or diagnosis of SARS-CoV-2 by FDA under an Emergency Use Authorization (EUA). This EUA will remain in effect (meaning this test can be used) for the duration of the COVID-19 declaration under Section 564(b)(1) of the Act, 21 U.S.C. section 360bbb-3(b)(1), unless the authorization is terminated or revoked.  Performed at [facility], [place] [address], [name], [place] [address]     Radiology Studies: [facility] Chest Port 1 View  Result Date: 02/05/2022 CLINICAL DATA:  Shortness of breath. EXAM: PORTABLE CHEST 1 VIEW COMPARISON:  Chest radiograph dated 10/16/2021. FINDINGS: Mild cardiomegaly with mild central vascular congestion. No focal consolidation, pleural effusion, pneumothorax. No acute osseous pathology. IMPRESSION: Mild cardiomegaly with mild central vascular congestion. No focal consolidation. Electronically Signed   By: Anner Crete M.D.   On: 02/05/2022 01:22       Graycen Degan T. [facility]  If 7PM-7AM, please contact night-coverage [email or web address] 02/05/2022, 4:06 PM

## 2022-02-05 NOTE — Subjective & Objective (Addendum)
Patient is a 46 year old female with a past medical history of class III obesity (BMI 62.74), asthma, chronic diastolic CHF, OSA/OHS, chronic respiratory failure, poorly controlled insulin-dependent type 2 diabetes, hypertension, hyperlipidemia, GERD, chronic pain syndrome, CKD stage IIIa, bipolar disorder, depression, PAD, history of left BKA and right TMA. Admitted 12/18/2021-12/21/2021 for HHS and nonhealing diabetic right foot ulcer at TMA stump site with MRI suspicious for early acute osteomyelitis for which orthopedics recommended conservative management.  She was discharged with a 4-week course of Augmentin.  Subsequently admitted to [place] from 1/19-1/28 and underwent surgical debridement and discharged on vancomycin, levofloxacin, and micafungin for 6 weeks.  Seen by ID at [facility] on 2/23 and recommendation was to continue the same regimen. ? ?Patient presents to the ED via EMS complaining of shortness of breath x2 days.  Oxygen saturation 93% on 2 L home oxygen.  She was given DuoNeb treatment by EMS.  Tachycardic and tachypneic on arrival to the ED.  Not febrile.  Maintaining sats in the 90s on 2 L oxygen, same as home requirement.  Labs showing WBC 12.0.  Hemoglobin normal.  Blood glucose 252.  BUN 53, creatinine 1.4 (baseline 1.1-1.4).  BNP normal.  High-sensitivity troponin negative x2.  D-dimer normal.  COVID and influenza PCR negative.  Chest x-ray showing mild central vascular congestion and no focal consolidation. ? ?Patient states she was eating a lot of salt and started feeling short of breath a few days ago.  Feels short of breath at rest and with exertion.  Also endorsing orthopnea.  Denies chest pain, cough, or wheezing.  She is taking torsemide 80 mg daily and has not missed any doses.  Denies history of blood clots.  She uses 2 L oxygen PRN during the day and BiPAP at night.  No other complaints. ?

## 2022-02-06 ENCOUNTER — Inpatient Hospital Stay (HOSPITAL_COMMUNITY): Payer: Medicaid Other

## 2022-02-06 DIAGNOSIS — I5033 Acute on chronic diastolic (congestive) heart failure: Secondary | ICD-10-CM | POA: Diagnosis not present

## 2022-02-06 DIAGNOSIS — M25561 Pain in right knee: Secondary | ICD-10-CM

## 2022-02-06 DIAGNOSIS — E11621 Type 2 diabetes mellitus with foot ulcer: Secondary | ICD-10-CM | POA: Diagnosis not present

## 2022-02-06 DIAGNOSIS — J9611 Chronic respiratory failure with hypoxia: Secondary | ICD-10-CM | POA: Diagnosis not present

## 2022-02-06 DIAGNOSIS — G8929 Other chronic pain: Secondary | ICD-10-CM

## 2022-02-06 DIAGNOSIS — M25562 Pain in left knee: Secondary | ICD-10-CM

## 2022-02-06 DIAGNOSIS — N1831 Chronic kidney disease, stage 3a: Secondary | ICD-10-CM | POA: Diagnosis not present

## 2022-02-06 LAB — MAGNESIUM: Magnesium: 2.3 mg/dL (ref 1.7–2.4)

## 2022-02-06 LAB — CBC
HCT: 36.1 % (ref 36.0–46.0)
Hemoglobin: 11.1 g/dL — ABNORMAL LOW (ref 12.0–15.0)
MCH: 30.1 pg (ref 26.0–34.0)
MCHC: 30.7 g/dL (ref 30.0–36.0)
MCV: 97.8 fL (ref 80.0–100.0)
Platelets: 314 10*3/uL (ref 150–400)
RBC: 3.69 MIL/uL — ABNORMAL LOW (ref 3.87–5.11)
RDW: 13.6 % (ref 11.5–15.5)
WBC: 10.1 10*3/uL (ref 4.0–10.5)
nRBC: 0 % (ref 0.0–0.2)

## 2022-02-06 LAB — GLUCOSE, CAPILLARY
Glucose-Capillary: 228 mg/dL — ABNORMAL HIGH (ref 70–99)
Glucose-Capillary: 316 mg/dL — ABNORMAL HIGH (ref 70–99)
Glucose-Capillary: 237 mg/dL — ABNORMAL HIGH (ref 70–99)
Glucose-Capillary: 255 mg/dL — ABNORMAL HIGH (ref 70–99)

## 2022-02-06 LAB — RENAL FUNCTION PANEL
Albumin: 3.1 g/dL — ABNORMAL LOW (ref 3.5–5.0)
Anion gap: 9 (ref 5–15)
BUN: 64 mg/dL — ABNORMAL HIGH (ref 6–20)
CO2: 28 mmol/L (ref 22–32)
Calcium: 8.4 mg/dL — ABNORMAL LOW (ref 8.9–10.3)
Chloride: 97 mmol/L — ABNORMAL LOW (ref 98–111)
Creatinine, Ser: 1.35 mg/dL — ABNORMAL HIGH (ref 0.44–1.00)
GFR, Estimated: 50 mL/min — ABNORMAL LOW (ref >60)
Glucose, Bld: 298 mg/dL — ABNORMAL HIGH (ref 70–99)
Phosphorus: 5.8 mg/dL — ABNORMAL HIGH (ref 2.5–4.6)
Potassium: 3.9 mmol/L (ref 3.5–5.1)
Sodium: 134 mmol/L — ABNORMAL LOW (ref 135–145)

## 2022-02-06 LAB — ECHOCARDIOGRAM COMPLETE
AR max vel: 3.11 cm2
AV Peak grad: 10.6 mmHg
Ao pk vel: 1.63 m/s
Area-P 1/2: 5.13 cm2
Height: 62 in
Weight: 5661.41 oz

## 2022-02-06 LAB — VANCOMYCIN, RANDOM: Vancomycin Rm: 14

## 2022-02-06 MED ORDER — INSULIN ASPART 100 UNIT/ML IJ SOLN
12.0000 [IU] | Freq: Three times a day (TID) | INTRAMUSCULAR | Status: DC
  Administered 2022-02-06 – 2022-02-07 (×3): 12 [IU] via SUBCUTANEOUS

## 2022-02-06 MED ORDER — VANCOMYCIN HCL 1500 MG/300ML IV SOLN
1500.0000 mg | INTRAVENOUS | Status: DC
  Administered 2022-02-06: 1500 mg via INTRAVENOUS
  Filled 2022-02-06 (×3): qty 300

## 2022-02-06 MED ORDER — ORAL CARE MOUTH RINSE
15.0000 mL | Freq: Two times a day (BID) | OROMUCOSAL | Status: DC
  Administered 2022-02-06: 15 mL via OROMUCOSAL

## 2022-02-06 MED ORDER — OXYCODONE-ACETAMINOPHEN 7.5-325 MG PO TABS
1.0000 | ORAL_TABLET | Freq: Three times a day (TID) | ORAL | Status: DC | PRN
  Administered 2022-02-06 – 2022-02-07 (×4): 1 via ORAL
  Filled 2022-02-06 (×4): qty 1

## 2022-02-06 MED ORDER — RAMELTEON 8 MG PO TABS
8.0000 mg | ORAL_TABLET | Freq: Every day | ORAL | Status: DC
  Administered 2022-02-06: 8 mg via ORAL
  Filled 2022-02-06 (×2): qty 1

## 2022-02-06 MED ORDER — INSULIN ASPART 100 UNIT/ML IJ SOLN
8.0000 [IU] | Freq: Three times a day (TID) | INTRAMUSCULAR | Status: DC
  Administered 2022-02-06: 8 [IU] via SUBCUTANEOUS

## 2022-02-06 NOTE — Progress Notes (Signed)
Nutrition Brief Note ? ?Patient identified on the Malnutrition Screening Tool (MST) Report ? ?Wt Readings from Last 15 Encounters:  ?02/06/22 (!) 160.5 kg  ?12/19/21 (!) 155.6 kg  ?10/16/21 (!) 154.2 kg  ?09/29/21 (!) 158.3 kg  ?07/02/21 (!) 167 kg  ?06/10/21 (!) 164.2 kg  ?05/28/21 (!) 164.2 kg  ?05/14/21 (!) 171.9 kg  ?04/27/21 (!) 167.7 kg  ?04/15/21 (!) 167.7 kg  ?04/03/21 (!) 165.9 kg  ?03/31/21 (!) 165.9 kg  ?01/20/21 (!) 164 kg  ?12/09/20 (!) 167.8 kg  ?12/04/20 (!) 169.2 kg  ? ? ?Body mass index is 64.72 kg/m?Marland Kitchen Patient meets criteria for morbid obesity based on current BMI.  ? ?Current diet order is Heart Healthy/Carb Modified, patient is consuming approximately 50-100% of meals at this time. Labs and medications reviewed.  ? ?No nutrition interventions warranted at this time. If nutrition issues arise, please consult RD.  ? ?Kerman Passey MS, RDN, LDN, CNSC ?Registered Dietitian III ?Clinical Nutrition ?RD Pager and On-Call Pager Number Located in [place]  ? ?

## 2022-02-06 NOTE — Progress Notes (Signed)
PROGRESS NOTE  [name] JOA:[id number] DOB: [birth year]   PCP: [name], MD  Patient is from: Home.  Lives with her 44 year old child.  Has a wheelchair at home.  She has home health aide  DOA: 02/05/2022 LOS: 1  Chief complaints:  Chief Complaint  Patient presents with   Shortness of Breath     Brief Narrative / Interim history: 46 year old F with PMH of morbid obesity (BMI 62.74), OSA/OHS on BiPAP at night, chronic hypoxic RF on 2 L, diastolic CHF, IDDM-2, chronic pain syndrome, CKD-3A, anxiety, depression, BPD, PAD, left BKA, and recent hospitalizations at [facility] from 1/19-1/28 for right TMA stump infection for which he underwent surgical debridement and discharged on vancomycin, Levaquin and micafungin for 6 weeks, presenting with worsening shortness of breath and orthopnea for 2 days, and admitted for acute on chronic diastolic CHF.  Started on IV Lasix.  Patient reports good compliance with diuretics but admits to salt indiscretion.    Subjective: Seen and examined earlier this morning.  No major events overnight of this morning.  She says she feels better today.  Breathing has improved but not quite back to her baseline.  She complains of chronic leg pain.  She is on Percocet and gabapentin at home.  No other complaints.  Objective: Vitals:   02/06/22 0443 02/06/22 0449 02/06/22 0901 02/06/22 0902  BP: (!) 142/85     Pulse: 96     Resp: 18     Temp: 97.9 F (36.6 C)     TempSrc: Oral     SpO2: 99%  99% 99%  Weight:  (!) 160.5 kg    Height:        Examination:  GENERAL: No apparent distress.  Nontoxic. HEENT: MMM.  Vision and hearing grossly intact.  NECK: Supple.  Difficult to assess JVD due to body habitus. RESP:  No IWOB.  Fair aeration bilaterally but limited exam due to body habitus. CVS:  RRR. Heart sounds normal.  ABD/GI/GU: BS+. Abd soft, NTND.  MSK/EXT: Left BKA.  Right TMA stump appears clean and dry. SKIN: As above. NEURO: Awake and alert.  Oriented appropriately.  No apparent focal neuro deficit. PSYCH: Calm. Normal affect.      Procedures:  None  Microbiology summarized: [id number] and influenza PCR nonreactive.  Assessment and Plan: * Acute on chronic diastolic (congestive) heart failure ([name]) TTE in 02/2021 with LVEF of 55 to 60% but limited exam due to suboptimal acoustic window.  Presented with dyspnea and orthopnea.  Likely due to salt indiscretion.  Started on IV Lasix.  About 1 L UOP overnight.  Creatinine is stable. -Continue IV Lasix 80 mg twice daily.   -Monitor renal function, intake and output, and daily weights.  -Follow echocardiogram. -Sodium and fluid restriction -BiPAP as needed for respiratory distress.  Chronic respiratory failure with hypoxia ([name]) On 2 L during the day and BiPAP at night at baseline.  VBG reassuring.  Continues to endorse difficulty breathing despite good oxygen saturation. -Minimum oxygen to keep saturation above 90%. -BiPAP as needed -Manage CHF as above -Bronchodilators for asthma   Diabetic ulcer of right foot [facility]) Admitted in January 2023 for this problem and MRI done at that time was suspicious for early acute osteomyelitis.  Underwent surgical debridement at [facility] and discharged with PICC line on vancomycin, Levaquin, and micafungin x6 weeks (end of therapy 02/09/2022). Seen by ID at [facility] on 2/23 and recommendation was to continue the same regimen.  TMA stump appears clean on  my exam.  See picture under media -Continue antibiotics and antifungal agent and 02/09/2022. -Outpatient ID follow-up.  Previously planned for 3/7. -Check CRP and ESR  Chronic kidney disease, stage 3a ([place]) Recent Labs    10/23/21 0219 10/24/21 0546 10/25/21 0610 11/04/21 1105 12/18/21 2328 12/19/21 0811 12/20/21 0238 12/21/21 0157 02/05/22 0110 02/06/22 0338  BUN 52* 49* 37* 53* 53* 69* 48* 38* 53* 64*  CREATININE 3.05* 2.38* 1.44* 1.38* 1.76* 1.40* 1.62* 1.18* 1.41* 1.35*  Creatinine is  stable. Continue monitoring   Leukocytosis Mild and likely reactive.  Resolved.  Insulin dependent type 2 diabetes mellitus ([name]) Uncontrolled IDDM-2 with hyperglycemia and other complications.  A1c persistently > 15.5%.  On Tresiba 46 units twice daily, NovoLog 20 units 3 times daily and Trulicity at home Recent Labs  Lab 02/05/22 0750 02/05/22 1154 02/05/22 1742 02/05/22 2051 02/06/22 0814  GLUCAP 196* 269* 243* 242* 228*  -Continue Semglee 46 units twice daily -Continue SSI-resistant scale -Increase NovoLog from 4 to 8 units 3 times daily with meals -Start statin if no contraindication -Continue home gabapentin -Check hemoglobin A1c   Debility Lives with 44 years old child.  She has home aide. -PT/OT eval  Bilateral knee pain PT/OT eval  Asthma, chronic Stable, no wheezing -Continue home inhalers  Hypertension Stable. -Continue IV Lasix and monitor blood pressure  OSA (obstructive sleep apnea) BiPAP as needed and at bedtime.  Obesity hypoventilation syndrome ([name]) BiPAP as needed and at bedtime.  Morbid obesity ([place]) Body mass index is 62.74 kg/m. -Already on Trulicity. -Very difficult situation           DVT prophylaxis:  enoxaparin (LOVENOX) injection 40 mg Start: 02/05/22 1000  Code Status: Full code Family Communication: Patient and/or RN. Available if any question.  Level of care: Progressive Status is: Inpatient Remains inpatient appropriate because: Due to acute on chronic diastolic CHF requiring IV diuretics      Final disposition: Likely home on 3/5.      Consultants:  None   Sch Meds:  Scheduled Meds:  aspirin EC  81 mg Oral Daily   Chlorhexidine Gluconate Cloth  6 each Topical Daily   enoxaparin (LOVENOX) injection  40 mg Subcutaneous Q12H   furosemide  80 mg Intravenous BID   gabapentin  600 mg Oral TID   insulin aspart  0-20 Units Subcutaneous TID WC   insulin aspart  0-5 Units Subcutaneous QHS   insulin aspart   8 Units Subcutaneous TID WC   insulin glargine-yfgn  46 Units Subcutaneous BID   mouth rinse  15 mL Mouth Rinse BID   mometasone-formoterol  2 puff Inhalation BID   ramelteon  8 mg Oral QHS   sodium chloride flush  10-40 mL Intracatheter Q12H   umeclidinium bromide  1 puff Inhalation Daily   Continuous Infusions:  anidulafungin 100 mg (02/06/22 1123)   levofloxacin (LEVAQUIN) IV 750 mg (02/06/22 0924)   vancomycin     PRN Meds:.acetaminophen **OR** acetaminophen, albuterol, oxyCODONE-acetaminophen, sodium chloride flush  Antimicrobials: Anti-infectives (From admission, onward)    Start     Dose/Rate Route Frequency Ordered Stop   02/06/22 1300  vancomycin (VANCOREADY) IVPB 1500 mg/300 mL        1,500 mg 150 mL/hr over 120 Minutes Intravenous Every 24 hours 02/06/22 1206     02/06/22 1000  anidulafungin (ERAXIS) 100 mg in sodium chloride 0.9 % 100 mL IVPB       See Hyperspace for full Linked Orders Report.   100 mg  78 mL/hr over 100 Minutes Intravenous Every 24 hours 02/05/22 0850     02/05/22 1100  vancomycin (VANCOCIN) IVPB 1000 mg/200 mL premix        1,000 mg 200 mL/hr over 60 Minutes Intravenous  Once 02/05/22 1054 02/05/22 1235   02/05/22 1000  anidulafungin (ERAXIS) 200 mg in sodium chloride 0.9 % 200 mL IVPB       See Hyperspace for full Linked Orders Report.   200 mg 78 mL/hr over 200 Minutes Intravenous  Once 02/05/22 0850 02/05/22 1431   02/05/22 1000  levofloxacin (LEVAQUIN) IVPB 750 mg        750 mg 100 mL/hr over 90 Minutes Intravenous Every 24 hours 02/05/22 0850     02/05/22 1000  vancomycin (VANCOREADY) IVPB 1500 mg/300 mL  Status:  Discontinued        1,500 mg 150 mL/hr over 120 Minutes Intravenous Daily 02/05/22 0929 02/05/22 1021        I have personally reviewed the following labs and images: CBC: Recent Labs  Lab 02/05/22 0110 02/06/22 0338  WBC 12.0* 10.1  HGB 12.2 11.1*  HCT 38.8 36.1  MCV 95.3 97.8  PLT 331 314   BMP &GFR Recent Labs   Lab 02/05/22 0110 02/06/22 0338  NA 136 134*  K 3.9 3.9  CL 95* 97*  CO2 28 28  GLUCOSE 252* 298*  BUN 53* 64*  CREATININE 1.41* 1.35*  CALCIUM 8.6* 8.4*  MG  --  2.3  PHOS  --  5.8*   Estimated Creatinine Clearance: 79.2 mL/min (A) (by C-G formula based on SCr of 1.35 mg/dL (H)). Liver & Pancreas: Recent Labs  Lab 02/06/22 0338  ALBUMIN 3.1*   No results for input(s): LIPASE, AMYLASE in the last 168 hours. No results for input(s): AMMONIA in the last 168 hours. Diabetic: No results for input(s): HGBA1C in the last 72 hours. Recent Labs  Lab 02/05/22 0750 02/05/22 1154 02/05/22 1742 02/05/22 2051 02/06/22 0814  GLUCAP 196* 269* 243* 242* 228*   Cardiac Enzymes: No results for input(s): CKTOTAL, CKMB, CKMBINDEX, TROPONINI in the last 168 hours. No results for input(s): PROBNP in the last 8760 hours. Coagulation Profile: No results for input(s): INR, PROTIME in the last 168 hours. Thyroid Function Tests: No results for input(s): TSH, T4TOTAL, FREET4, T3FREE, THYROIDAB in the last 72 hours. Lipid Profile: No results for input(s): CHOL, HDL, LDLCALC, TRIG, CHOLHDL, LDLDIRECT in the last 72 hours. Anemia Panel: No results for input(s): VITAMINB12, FOLATE, FERRITIN, TIBC, IRON, RETICCTPCT in the last 72 hours. Urine analysis:    Component Value Date/Time   COLORURINE YELLOW 12/19/2021 0932   APPEARANCEUR CLEAR 12/19/2021 0932   LABSPEC 1.010 12/19/2021 0932   PHURINE 5.0 12/19/2021 0932   GLUCOSEU >=500 (A) 12/19/2021 0932   HGBUR TRACE (A) 12/19/2021 0932   BILIRUBINUR NEGATIVE 12/19/2021 0932   BILIRUBINUR negative 09/29/2021 1220   KETONESUR NEGATIVE 12/19/2021 0932   PROTEINUR NEGATIVE 12/19/2021 0932   UROBILINOGEN 0.2 09/29/2021 1220   UROBILINOGEN 0.2 08/22/2015 0225   NITRITE NEGATIVE 12/19/2021 0932   LEUKOCYTESUR TRACE (A) 12/19/2021 0932   Sepsis Labs: Invalid input(s): PROCALCITONIN, [place]  Microbiology: Recent Results (from the past  240 hour(s))  Resp Panel by RT-PCR (Flu A&B, Covid) Nasopharyngeal Swab     Status: None   Collection Time: 02/05/22  6:00 AM   Specimen: Nasopharyngeal Swab; Nasopharyngeal(NP) swabs in vial transport medium  Result Value Ref Range Status   SARS Coronavirus 2 by RT PCR NEGATIVE  NEGATIVE Final    Comment: (NOTE) SARS-CoV-2 target nucleic acids are NOT DETECTED.  The SARS-CoV-2 RNA is generally detectable in upper respiratory specimens during the acute phase of infection. The lowest concentration of SARS-CoV-2 viral copies this assay can detect is 138 copies/mL. A negative result does not preclude SARS-Cov-2 infection and should not be used as the sole basis for treatment or other patient management decisions. A negative result may occur with  improper specimen collection/handling, submission of specimen other than nasopharyngeal swab, presence of viral mutation(s) within the areas targeted by this assay, and inadequate number of viral copies(<138 copies/mL). A negative result must be combined with clinical observations, patient history, and epidemiological information. The expected result is Negative.  Fact Sheet for Patients:  EntrepreneurPulse.com.au  Fact Sheet for Healthcare Providers:  IncredibleEmployment.be  This test is no t yet approved or cleared by the [place] FDA and  has been authorized for detection and/or diagnosis of SARS-CoV-2 by FDA under an Emergency Use Authorization (EUA). This EUA will remain  in effect (meaning this test can be used) for the duration of the COVID-19 declaration under Section 564(b)(1) of the Act, 21 U.S.C.section 360bbb-3(b)(1), unless the authorization is terminated  or revoked sooner.       Influenza A by PCR NEGATIVE NEGATIVE Final   Influenza B by PCR NEGATIVE NEGATIVE Final    Comment: (NOTE) The Xpert Xpress SARS-CoV-2/FLU/RSV plus assay is intended as an aid in the diagnosis of influenza  from Nasopharyngeal swab specimens and should not be used as a sole basis for treatment. Nasal washings and aspirates are unacceptable for Xpert Xpress SARS-CoV-2/FLU/RSV testing.  Fact Sheet for Patients: EntrepreneurPulse.com.au  Fact Sheet for Healthcare Providers: IncredibleEmployment.be  This test is not yet approved or cleared by the [place] FDA and has been authorized for detection and/or diagnosis of SARS-CoV-2 by FDA under an Emergency Use Authorization (EUA). This EUA will remain in effect (meaning this test can be used) for the duration of the COVID-19 declaration under Section 564(b)(1) of the Act, 21 U.S.C. section 360bbb-3(b)(1), unless the authorization is terminated or revoked.  Performed at [facility], [place] [address], [place], [place] [address]     Radiology Studies: DG CHEST PORT 1 VIEW  Result Date: 02/05/2022 CLINICAL DATA:  PICC line placement. EXAM: PORTABLE CHEST 1 VIEW COMPARISON:  02/05/2022. FINDINGS: Examination is limited due to motion artifact and patient's body habitus. The heart is enlarged the mediastinal contour is stable. Lung volumes are low. The pulmonary vasculature is distended. No consolidation, effusion, or pneumothorax. The distal tip of the right PICC line terminates over the superior vena cava. IMPRESSION: 1. Right-sided PICC line terminates over the superior vena cava. 2. Cardiomegaly with mild pulmonary vascular congestion. Electronically Signed   By: Brett Fairy M.D.   On: 02/05/2022 22:30       Herbert Aguinaldo T. Grover  If 7PM-7AM, please contact night-coverage [email or web address] 02/06/2022, 12:10 PM

## 2022-02-06 NOTE — Progress Notes (Incomplete)
Inpatient Diabetes Program Recommendations ? ?AACE/ADA: New Consensus Statement on Inpatient Glycemic Control (2015) ? ?Target Ranges:  Prepandial:   less than 140 mg/dL ?     Peak postprandial:   less than 180 mg/dL (1-2 hours) ?     Critically ill patients:  140 - 180 mg/dL  ? ?Lab Results  ?Component Value Date  ? GLUCAP 316 (H) 02/06/2022  ? HGBA1C >15.5 (H) 12/19/2021  ? ? ?Review of Glycemic Control ? ?Diabetes history: DM2 ?Outpatient Diabetes medications: *** ?Current orders for Inpatient glycemic control: *** ? ?Inpatient Diabetes Program Recommendations:   ? ? ? ? ?

## 2022-02-06 NOTE — Progress Notes (Signed)
Patient educated on daily fluid restriction of 1200 mL, verbalized understanding. ? ?Angie Fava, RN  ?

## 2022-02-06 NOTE — Progress Notes (Signed)
OT Cancellation Note ? ?Patient Details ?Name: [name] ?MRN: [id number] ?DOB: Jul 05, 1976 ? ? ?Cancelled Treatment:    Reason Eval/Treat Not Completed: Patient declined, no reason specified ?Patient reported increased fatigue and pain at this time. OT to continue to follow and check back as schedule will allow. ?Iysis Germain OTR/L, MS ?Acute Rehabilitation Department ?Office# [phone] ?Pager# [phone] ? ? ?02/06/2022, 11:58 AM ?

## 2022-02-06 NOTE — Progress Notes (Signed)
Echocardiogram ?2D Echocardiogram has been performed. ? ?[name] ?02/06/2022, 2:27 PM ?

## 2022-02-06 NOTE — Progress Notes (Signed)
PT Cancellation Note ? ?Patient Details ?Name: [name] ?MRN: [id number] ?DOB: Aug 21, 1976 ? ? ?Cancelled Treatment:    Reason Eval/Treat Not Completed: Pain limiting ability to participate. Pt stated her legs hurt too much to participate with PT.  Will check back as schedule permits. ? ? ?Galen Manila ?02/06/2022, 1:31 PM ?

## 2022-02-06 NOTE — Progress Notes (Signed)
Pharmacy Antibiotic Note ? ?[name] is a 46 y.o. female admitted on 02/05/2022 with known TMA stump infection seen by [facility] ID on outpatient Micafungin + Levaquin + Vancomycin. Pharmacy has been consulted to resume therapy inpatient.  ? ?Per patient report the patient's last doses were at 10AM on 3/2. PTA the patient was taking:  ?- Vancomycin 1500 mg IV every 24 hours ?- Levaquin 750 mg IV every 24 hours ?- Micafungin 150 mg IV every 24 hours ? ?Levels per [facility] notes for Vancomycin show the following: ?- 2/28 VT 10.8, SCr 1.34 ?- 2/21'@1000'$  9.1 (true); AUC 404 1.5 g q24h @ 1000 Continue  ?- 2/14'@0900'$  10.8 (1-hr early); AUC 445 1.5 g q24h @ 1000 Continue  ? ?A level drawn on 02/05/21 at 0847 was 19 mcg/ml at SCr 1.41, true trough likely closer to 18 mcg/ml - which is oddly elevated from recent trough values for this patient given the similar SCr. Vancomycin 1000 mg IV x1 given on 02/05/22 at 1152. ? ?Today, 02/06/2022: ?- vancomycin random level is 14 (~24 hrs after the vanc dose given on 3/3) ?- scr down 1.35 (crcl~79) ?- afeb, wbc 10.1 ? ? ?Plan: ?- Vancomycin 1500 mg IV q24h ?- Continue Levaquin 750 mg IV every 24 hours ?- continue Eraxis 100 mg IV every 24 hours ? ?__________________________________________ ? ?Height: '5\' 2"'$  (157.5 cm) ?Weight: (!) 160.5 kg (353 lb 13.4 oz) ?IBW/kg (Calculated) : 50.1 ? ?Temp (24hrs), Avg:97.9 ?F (36.6 ?C), Min:97.8 ?F (36.6 ?C), Max:97.9 ?F (36.6 ?C) ? ?Recent Labs  ?Lab 02/05/22 ?0110 02/05/22 ?4492 02/06/22 ?0100  ?WBC 12.0*  --  10.1  ?CREATININE 1.41*  --  1.35*  ?[place]  --  19  --   ? ?  ?Estimated Creatinine Clearance: 79.2 mL/min (A) (by C-G formula based on SCr of 1.35 mg/dL (H)).   ? ?Allergies  ?Allergen Reactions  ? Cefepime Other (See Comments)  ?  AKI, see records from [place] hospitalization in January 2020.   ?Other reaction(s): Other (See Comments) ?"Shut down organs/kidneys" ? ?Note pt has tolerated Rocephin and Keflex  ? Kiwi Extract Shortness Of Breath, Swelling  and Anaphylaxis  ? Morphine And Related Nausea And Vomiting  ? Nalbuphine   ?  Other reaction(s): Hallucinations, Other (See Comments) ?"FEELS LIKE SOMETHING CRAWLING ON ME" ?Reaction:  Nervousness ?"FEELS LIKE SOMETHING CRAWLING ON ME" ?Reaction:  Nervousness ?Reaction:  Nervousness ?"FEELS LIKE SOMETHING CRAWLING ON ME" ?  ? Trental [Pentoxifylline] Nausea And Vomiting  ? Toradol [Ketorolac Tromethamine] Other (See Comments)  ?  Feels like something is crawling on me  ? Morphine Nausea And Vomiting  ? Nubain [Nalbuphine Hcl] Other (See Comments)  ?  "FEELS LIKE SOMETHING CRAWLING ON ME"  ? ? ?Antimicrobials this admission: ?PTA Micafungin/LVQ/Vanc ?Levaquin 3/3 >> ?Vancomycin 3/3 >> ?Eraxis 3/3 >> ? ?Microbiology results: ?3/3 COVID/flu >> neg ? ?Thank you for allowing pharmacy to be a part of this patient?s care. ? ?Dia Sitter, PharmD, BCPS ?02/06/2022 10:39 AM ? ?

## 2022-02-07 DIAGNOSIS — I5033 Acute on chronic diastolic (congestive) heart failure: Secondary | ICD-10-CM | POA: Diagnosis not present

## 2022-02-07 DIAGNOSIS — J9611 Chronic respiratory failure with hypoxia: Secondary | ICD-10-CM | POA: Diagnosis not present

## 2022-02-07 DIAGNOSIS — N1831 Chronic kidney disease, stage 3a: Secondary | ICD-10-CM | POA: Diagnosis not present

## 2022-02-07 DIAGNOSIS — E11621 Type 2 diabetes mellitus with foot ulcer: Secondary | ICD-10-CM | POA: Diagnosis not present

## 2022-02-07 LAB — CBC
HCT: 31.6 % — ABNORMAL LOW (ref 36.0–46.0)
Hemoglobin: 10.1 g/dL — ABNORMAL LOW (ref 12.0–15.0)
MCH: 31.1 pg (ref 26.0–34.0)
MCHC: 32 g/dL (ref 30.0–36.0)
MCV: 97.2 fL (ref 80.0–100.0)
Platelets: 289 10*3/uL (ref 150–400)
RBC: 3.25 MIL/uL — ABNORMAL LOW (ref 3.87–5.11)
RDW: 13.4 % (ref 11.5–15.5)
WBC: 8.7 10*3/uL (ref 4.0–10.5)
nRBC: 0 % (ref 0.0–0.2)

## 2022-02-07 LAB — RENAL FUNCTION PANEL
Albumin: 2.9 g/dL — ABNORMAL LOW (ref 3.5–5.0)
Anion gap: 8 (ref 5–15)
BUN: 55 mg/dL — ABNORMAL HIGH (ref 6–20)
CO2: 29 mmol/L (ref 22–32)
Calcium: 8.4 mg/dL — ABNORMAL LOW (ref 8.9–10.3)
Chloride: 98 mmol/L (ref 98–111)
Creatinine, Ser: 1.17 mg/dL — ABNORMAL HIGH (ref 0.44–1.00)
GFR, Estimated: 59 mL/min — ABNORMAL LOW (ref >60)
Glucose, Bld: 226 mg/dL — ABNORMAL HIGH (ref 70–99)
Phosphorus: 4.1 mg/dL (ref 2.5–4.6)
Potassium: 3.6 mmol/L (ref 3.5–5.1)
Sodium: 135 mmol/L (ref 135–145)

## 2022-02-07 LAB — GLUCOSE, CAPILLARY
Glucose-Capillary: 203 mg/dL — ABNORMAL HIGH (ref 70–99)
Glucose-Capillary: 269 mg/dL — ABNORMAL HIGH (ref 70–99)

## 2022-02-07 LAB — SEDIMENTATION RATE: Sed Rate: 63 mm/hr — ABNORMAL HIGH (ref 0–22)

## 2022-02-07 LAB — CREATININE, SERUM
Creatinine, Ser: 1.21 mg/dL — ABNORMAL HIGH (ref 0.44–1.00)
GFR, Estimated: 57 mL/min — ABNORMAL LOW (ref >60)

## 2022-02-07 LAB — MAGNESIUM: Magnesium: 2.3 mg/dL (ref 1.7–2.4)

## 2022-02-07 LAB — C-REACTIVE PROTEIN: CRP: 2 mg/dL — ABNORMAL HIGH (ref <1.0)

## 2022-02-07 MED ORDER — HEPARIN SOD (PORK) LOCK FLUSH 100 UNIT/ML IV SOLN
250.0000 [IU] | INTRAVENOUS | Status: AC | PRN
  Administered 2022-02-07: 250 [IU]
  Filled 2022-02-07: qty 2.5

## 2022-02-07 MED ORDER — POTASSIUM CHLORIDE CRYS ER 20 MEQ PO TBCR
40.0000 meq | EXTENDED_RELEASE_TABLET | Freq: Once | ORAL | Status: AC
  Administered 2022-02-07: 40 meq via ORAL
  Filled 2022-02-07: qty 2

## 2022-02-07 NOTE — Progress Notes (Addendum)
PTAR scheduled to pick up patient at 1430.  Patient requested to self-administer daily scheduled vancomycin via PICC line upon returning home.   ? ?PIV and cardiac monitoring removed.  Discharge instructions reviewed with patient, placed in discharge envelope for PTAR. ? ?Awaiting PTAR. ? ?Angie Fava, RN  ?

## 2022-02-07 NOTE — Evaluation (Signed)
Physical Therapy Evaluation ?Patient Details ?Name: Raylea Adcox ?MRN: [id number] ?DOB: November 15, 1976 ?Today's Date: 02/07/2022 ? ?History of Present Illness ? 46 year old female with a past medical history of class III obesity (BMI 62.74), asthma, chronic diastolic CHF, OSA/OHS, chronic respiratory failure, poorly controlled insulin-dependent type 2 diabetes, hypertension, hyperlipidemia, GERD, chronic pain syndrome, CKD stage IIIa, bipolar disorder, depression, PAD, history of left BKA and right TMA. Admitted 12/18/2021-12/21/2021 for HHS and nonhealing diabetic right foot ulcer at TMA stump site with MRI suspicious for early acute osteomyelitis for which orthopedics recommended conservative management. admitted with acute on chronic dCHF  ?Clinical Impression ? Patient evaluated by Physical Therapy with no further acute PT needs identified. All education has been completed and the patient has no further questions.  ?No further needs at this time. Pt is overall at her baseline, see below for details. ? See below for any follow-up Physical Therapy or equipment needs. PT is signing off. Thank you for this referral. ?   ?   ? ?Recommendations for follow up therapy are one component of a multi-disciplinary discharge planning process, led by the attending physician.  Recommendations may be updated based on patient status, additional functional criteria and insurance authorization. ? ?Follow Up Recommendations No PT follow up ? ?  ?Assistance Recommended at Discharge Frequent or constant Supervision/Assistance  ?Patient can return home with the following ? A little help with walking and/or transfers;Assistance with cooking/housework;Help with stairs or ramp for entrance;A little help with bathing/dressing/bathroom ? ?  ?Equipment Recommendations None recommended by PT  ?Recommendations for Other Services ?    ?  ?Functional Status Assessment Patient has not had a recent decline in their functional status  ? ?  ?Precautions /  Restrictions Precautions ?Precautions: Fall ?Restrictions ?Weight Bearing Restrictions: Yes ?RLE Weight Bearing: Non weight bearing(in flowsheets)---no orders noted for NWB--?? ?Other Position/Activity Restrictions: pt reports her doctor at [facility] said she could WB on RLE for very short distance amb in the home; pt attempts to minimize wt as much as possible, no bleeding or changes noted R TMA after amb to bathroom. ? ?  ? ?Mobility ? Bed Mobility ?Overal bed mobility: Needs Assistance ?Bed Mobility: Supine to Sit ?  ?  ?Supine to sit: Min assist ?  ?  ?General bed mobility comments: pt requires assist to pull up to sitting EOB ?  ? ?Transfers ?Overall transfer level: Needs assistance ?Equipment used: Rolling walker (2 wheels) ?Transfers: Sit to/from Stand ?Sit to Stand: Min assist, Min guard ?  ?  ?  ?  ?  ?General transfer comment: from bed and BSC, assist to don prosthesis ?  ? ?Ambulation/Gait ?Ambulation/Gait assistance: Min guard ?Gait Distance (Feet): 10 Feet (x2) ?Assistive device: Rolling walker (2 wheels) ?Gait Pattern/deviations: Step-to pattern ?  ?  ?  ?General Gait Details: pt limiting wt on RLE as much as possible, no LOB, reliant on RW. min/guard for safety ? ?Stairs ?  ?  ?  ?  ?  ? ?Wheelchair Mobility ?  ? ?Modified Rankin (Stroke Patients Only) ?  ? ?  ? ?Balance Overall balance assessment: Needs assistance ?Sitting-balance support: Feet supported ?Sitting balance-Leahy Scale: Good ?Sitting balance - Comments: pt is able to wt shift R and L for removal of gown, donning of prosthesis ?  ?  ?Standing balance-Leahy Scale: Poor ?  ?  ?  ?  ?  ?  ?  ?  ?  ?  ?  ?  ?   ? ? ? ?  Pertinent Vitals/Pain Pain Assessment ?Pain Assessment: Faces ?Faces Pain Scale: Hurts a little bit ?Pain Location: diffuse ?Pain Descriptors / Indicators: Grimacing ?Pain Intervention(s): Limited activity within patient's tolerance, Monitored during session  ? ? ?Home Living Family/patient expects to be discharged to:: Private  residence ?Living Arrangements: Children ?Available Help at Discharge: Family;Personal care attendant;Available 24 hours/day ?Type of Home: Apartment ?Home Access: Level entry ?  ?  ?  ?Home Layout: One level ?Home Equipment: Rolling Walker (2 wheels);BSC/3in1;Grab bars - tub/shower;Tub bench;Wheelchair - manual ?Additional Comments: has L LE prosthesis  ?  ?Prior Function   ?  ?  ?  ?  ?  ?  ?Mobility Comments: Pt requires assist to pull up to sit EOB and supervision for transfers and gait with RW and prosthesis donned, (pt is only amb ~ 8-10' bc she is to limit WBing RLE (TMA) per her report) uses w/c for longer distance mobility. ?  ?  ? ? ?Hand Dominance  ?   ? ?  ?Extremity/Trunk Assessment  ? Upper Extremity Assessment ?Upper Extremity Assessment: Defer to OT evaluation ?  ? ?Lower Extremity Assessment ?Lower Extremity Assessment: Overall WFL for tasks assessed ?  ? ?   ?Communication  ?    ?Cognition Arousal/Alertness: Awake/alert ?Behavior During Therapy: [facility] for tasks assessed/performed ?Overall Cognitive Status: Within Functional Limits for tasks assessed ?  ?  ?  ?  ?  ?  ?  ?  ?  ?  ?  ?  ?  ?  ?  ?  ?  ?  ?  ? ?  ?General Comments   ? ?  ?Exercises    ? ?Assessment/Plan  ?  ?PT Assessment Patient does not need any further PT services  ?PT Problem List   ? ?   ?  ?PT Treatment Interventions     ? ?PT Goals (Current goals can be found in the Care Plan section)  ?Acute Rehab PT Goals ?Patient Stated Goal: home ?PT Goal Formulation: All assessment and education complete, DC therapy ? ?  ?Frequency   ?  ? ? ?Co-evaluation   ?  ?  ?  ?  ? ? ?  ?AM-PAC PT "6 Clicks" Mobility  ?Outcome Measure Help needed turning from your back to your side while in a flat bed without using bedrails?: None ?Help needed moving from lying on your back to sitting on the side of a flat bed without using bedrails?: A Little ?Help needed moving to and from a bed to a chair (including a wheelchair)?: A Little ?Help needed standing up  from a chair using your arms (e.g., wheelchair or bedside chair)?: A Little ?Help needed to walk in hospital room?: A Little ?Help needed climbing 3-5 steps with a railing? : Total ?6 Click Score: 17 ? ?  ?End of Session   ?Activity Tolerance: Patient tolerated treatment well ?Patient left: with call bell/phone within reach;in bed;with bed alarm set ?Nurse Communication: Mobility status ?PT Visit Diagnosis: Other abnormalities of gait and mobility (R26.89) ?  ? ?Time: 2836-6294 ?PT Time Calculation (min) (ACUTE ONLY): 36 min ? ? ?Charges:   PT Evaluation ?$PT Eval Low Complexity: 1 Low ?PT Treatments ?$Gait Training: 8-22 mins ?  ?   ? ? ?Baxter Flattery, PT ? ?Acute Rehab Dept [facility] [address] [phone] ?Pager [phone] ? ?02/07/2022 ? ? ?Emanuela Runnion ?02/07/2022, 12:29 PM ? ?

## 2022-02-07 NOTE — Progress Notes (Signed)
OT Cancellation Note ? ?Patient Details ?Name: [name] ?MRN: [id number] ?DOB: 07-01-1976 ? ? ?Cancelled Treatment:    Reason Eval/Treat Not Completed: Other (comment) ?Patient appears to be at baseline at this time. Patient has caregiver for 8 hours each day at baseline. Patient plans to d/c today. No OT needs identified at this time ?[name] OTR/L, MS ?Acute Rehabilitation Department ?Office# [phone] ?Pager# [phone] ? ? ?02/07/2022, 12:55 PM ?

## 2022-02-07 NOTE — Plan of Care (Signed)
?  Problem: Education: ?Goal: Ability to demonstrate management of disease process will improve ?Outcome: Adequate for Discharge ?Goal: Ability to verbalize understanding of medication therapies will improve ?Outcome: Adequate for Discharge ?Goal: Individualized Educational Video(s) ?Outcome: Adequate for Discharge ?  ?Problem: Activity: ?Goal: Capacity to carry out activities will improve ?Outcome: Adequate for Discharge ?  ?Problem: Cardiac: ?Goal: Ability to achieve and maintain adequate cardiopulmonary perfusion will improve ?Outcome: Adequate for Discharge ?  ?Problem: Education: ?Goal: Knowledge of General Education information will improve ?Description: Including pain rating scale, medication(s)/side effects and non-pharmacologic comfort measures ?Outcome: Adequate for Discharge ?  ?Problem: Health Behavior/Discharge Planning: ?Goal: Ability to manage health-related needs will improve ?Outcome: Adequate for Discharge ?  ?Problem: Clinical Measurements: ?Goal: Ability to maintain clinical measurements within normal limits will improve ?Outcome: Adequate for Discharge ?Goal: Will remain free from infection ?Outcome: Adequate for Discharge ?Goal: Diagnostic test results will improve ?Outcome: Adequate for Discharge ?Goal: Respiratory complications will improve ?Outcome: Adequate for Discharge ?Goal: Cardiovascular complication will be avoided ?Outcome: Adequate for Discharge ?  ?Problem: Activity: ?Goal: Risk for activity intolerance will decrease ?Outcome: Adequate for Discharge ?  ?Problem: Nutrition: ?Goal: Adequate nutrition will be maintained ?Outcome: Adequate for Discharge ?  ?Problem: Coping: ?Goal: Level of anxiety will decrease ?Outcome: Adequate for Discharge ?  ?Problem: Elimination: ?Goal: Will not experience complications related to bowel motility ?Outcome: Adequate for Discharge ?Goal: Will not experience complications related to urinary retention ?Outcome: Adequate for Discharge ?  ?Problem: Pain  Managment: ?Goal: General experience of comfort will improve ?Outcome: Adequate for Discharge ?  ?Problem: Safety: ?Goal: Ability to remain free from injury will improve ?Outcome: Adequate for Discharge ?  ?Problem: Skin Integrity: ?Goal: Risk for impaired skin integrity will decrease ?Outcome: Adequate for Discharge ?  ?Problem: Education: ?Goal: Ability to describe self-care measures that may prevent or decrease complications (Diabetes Survival Skills Education) will improve ?Outcome: Adequate for Discharge ?Goal: Individualized Educational Video(s) ?Outcome: Adequate for Discharge ?  ?Problem: Coping: ?Goal: Ability to adjust to condition or change in health will improve ?Outcome: Adequate for Discharge ?  ?Problem: Fluid Volume: ?Goal: Ability to maintain a balanced intake and output will improve ?Outcome: Adequate for Discharge ?  ?Problem: Health Behavior/Discharge Planning: ?Goal: Ability to identify and utilize available resources and services will improve ?Outcome: Adequate for Discharge ?Goal: Ability to manage health-related needs will improve ?Outcome: Adequate for Discharge ?  ?Problem: Metabolic: ?Goal: Ability to maintain appropriate glucose levels will improve ?Outcome: Adequate for Discharge ?  ?Problem: Nutritional: ?Goal: Maintenance of adequate nutrition will improve ?Outcome: Adequate for Discharge ?Goal: Progress toward achieving an optimal weight will improve ?Outcome: Adequate for Discharge ?  ?Problem: Skin Integrity: ?Goal: Risk for impaired skin integrity will decrease ?Outcome: Adequate for Discharge ?  ?Problem: Tissue Perfusion: ?Goal: Adequacy of tissue perfusion will improve ?Outcome: Adequate for Discharge ?  ?Problem: Education: ?Goal: Knowledge of disease and its progression will improve ?Outcome: Adequate for Discharge ?Goal: Individualized Educational Video(s) ?Outcome: Adequate for Discharge ?  ?Problem: Fluid Volume: ?Goal: Compliance with measures to maintain balanced fluid  volume will improve ?Outcome: Adequate for Discharge ?  ?Problem: Health Behavior/Discharge Planning: ?Goal: Ability to manage health-related needs will improve ?Outcome: Adequate for Discharge ?  ?Problem: Nutritional: ?Goal: Ability to make healthy dietary choices will improve ?Outcome: Adequate for Discharge ?  ?Problem: Clinical Measurements: ?Goal: Complications related to the disease process, condition or treatment will be avoided or minimized ?Outcome: Adequate for Discharge ?  ?

## 2022-02-07 NOTE — Discharge Summary (Signed)
Physician Discharge Summary  [name] ZMO:[id number] DOB: May 08, 1976 DOA: 02/05/2022  PCP: [name], MD  Admit date: 02/05/2022 Discharge date: 02/07/2022 Admitted From: Home Disposition: Home Recommendations for Outpatient Follow-up:  Follow ups as below. Patient has upcoming appointment with infectious disease on 02/09/2022. Please obtain CBC/BMP/Mag at follow up Reassess fluid status and adjust diuretics as appropriate.  Consider as needed in addition to scheduled Consider adding statin Please follow up on the following pending results: None  Home Health: No therapy need identified Equipment/Devices: Patient has appropriate DME's  Discharge Condition: Stable CODE STATUS: Full code  Follow-up Information     [name], MD. Schedule an appointment as soon as possible for a visit in 1 week(s).   Specialty: Family Medicine Contact information: [place] [id number] [phone]                 46 year old F with PMH of morbid obesity (BMI 62.74), OSA/OHS on BiPAP at night, chronic hypoxic RF on 2 L, diastolic CHF, IDDM-2, chronic pain syndrome, CKD-3A, anxiety, depression, BPD, PAD, left BKA, and recent hospitalizations at [facility] from 1/19-1/28 for right TMA stump infection for which he underwent surgical debridement and discharged on vancomycin, Levaquin and micafungin for 6 weeks, presenting with worsening shortness of breath and orthopnea for 2 days, and admitted for acute on chronic diastolic CHF.  Started on IV Lasix.  Patient reports good compliance with diuretics but admits to salt indiscretion.  Patient was diuresed with IV Lasix with improvement in his symptoms.  Intake and output was not well captured.  Weight was stable but not reliable.  She feels well and ready to go home.  She is discharged on home torsemide and potassium.  TTE was attempted but not successful due to difficult acoustic window.  She was counseled on  sodium and fluid restriction.  Outpatient follow-up with PCP in 1 to 2 weeks or sooner if needed  In regards to right TMA stump infection, surgical wound appears clean and healing.  She was continued on IV vancomycin, Levaquin and micafungin.  CRP and ESR 2.0 and 63 respectively, lower than previous values at [facility]  She has upcoming appointment with infectious disease on 02/09/2022.  Patient has appropriate DME's.  No therapy needed was identified.   See individual problem list below for more on hospital course.  Discharge Diagnoses:  Assessment and Plan: * Acute on chronic diastolic (congestive) heart failure ([name]) TTE in 02/2021 with LVEF of 55 to 60% but limited exam due to suboptimal acoustic window.  Presented with dyspnea and orthopnea.  Likely due to salt indiscretion.  Diuresed with IV Lasix 80 mg twice daily with improvement in his symptoms.  Weight is stable at 160.5 kg.  Intake and output was not well captured.  It is difficult to assess fluid status given body habitus.  -Discharged on home torsemide 80 mg twice daily -She will continue potassium -Counseled on sodium and fluid restriction  Chronic respiratory failure with hypoxia ([name]) On 2 L during the day and BiPAP at night at baseline.  VBG reassuring.  Breathing has improved. -Continue home oxygen and CPAP   Diabetic ulcer of right foot [facility]) Admitted in January 2023 for this problem and MRI done at that time was suspicious for early acute osteomyelitis.  Underwent surgical debridement at [facility] and discharged with PICC line on vancomycin, Levaquin, and micafungin x6 weeks (end of therapy 02/09/2022). Seen by ID at [facility] on 2/23 and recommendation was  to continue the same regimen.  TMA stump appears clean on my exam.  See picture under media.  CRP 2.0.  ESR 63.  Both lower than prior values. -Continue antibiotics and antifungal agent and 02/09/2022. -Patient has upcoming follow-up with ID on 3/7   Chronic kidney disease, stage 3a  ([place]) Recent Labs    10/24/21 0546 10/25/21 0610 11/04/21 1105 12/18/21 2328 12/19/21 0811 12/20/21 0238 12/21/21 0157 02/05/22 0110 02/06/22 0338 02/07/22 0357  BUN 49* 37* 53* 53* 69* 48* 38* 53* 64* 55*  CREATININE 2.38* 1.44* 1.38* 1.76* 1.40* 1.62* 1.18* 1.41* 1.35* 1.17*   1.21*  Renal function improved.  Recheck at follow-up.   Leukocytosis Mild and likely reactive.  Resolved.  Insulin dependent type 2 diabetes mellitus ([name]) Uncontrolled IDDM-2 with hyperglycemia and other complications.  A1c persistently > 15.5%.  On Tresiba 46 units twice daily, NovoLog 20 units 3 times daily and Trulicity at home Recent Labs  Lab 02/06/22 1219 02/06/22 1739 02/06/22 2131 02/07/22 0915 02/07/22 1117  GLUCAP 316* 237* 255* 203* 269*  -Discharged on home insulin and Trulicity -Recommend statin if no contraindication   Debility Reports using a scooter at baseline.  She has home aide.  No therapy need identified.  She has appropriate DME's.  Asthma, chronic Stable, no wheezing -Continue home inhalers  Hypertension Stable. -Continue home diuretics.  OSA (obstructive sleep apnea) CPAP as needed and at bedtime.  Obesity hypoventilation syndrome ([name]) BiPAP as needed and at bedtime.  Morbid obesity ([place]) Body mass index is 62.74 kg/m. -Already on Trulicity. -Very difficult situation       Body mass index is 64.72 kg/m.           Discharge Exam: Vitals:   02/06/22 1222 02/06/22 1949 02/06/22 2028 02/07/22 0656  BP: 127/77  128/65   Pulse: 85  97   Temp: 98.4 F (36.9 C)  97.8 F (36.6 C) Comment: Pt refused  Resp: (!) 22  20   Height:      Weight:      SpO2: 96% 99% 96%   TempSrc: Oral  Oral   BMI (Calculated):         GENERAL: No apparent distress.  Nontoxic. HEENT: MMM.  Vision and hearing grossly intact.  NECK: Supple.  Difficult to assess JVD due to body habitus. RESP:  No IWOB.  Fair aeration bilaterally but limited exam due to body  habitus. CVS:  RRR. Heart sounds normal.  ABD/GI/GU: Bowel sounds present. Soft. Non tender.  MSK/EXT: Right BKA.  Left TMA wound healing.  SKIN: Left TMA wound healing. NEURO: Awake and alert.  Oriented appropriately.  No apparent focal neuro deficit. PSYCH: Calm. Normal affect.   Discharge Instructions  Discharge Instructions     (HEART FAILURE PATIENTS) Call MD:  Anytime you have any of the following symptoms: 1) 3 pound weight gain in 24 hours or 5 pounds in 1 week 2) shortness of breath, with or without a dry hacking cough 3) swelling in the hands, feet or stomach 4) if you have to sleep on extra pillows at night in order to breathe.   Complete by: As directed    Call MD for:  difficulty breathing, headache or visual disturbances   Complete by: As directed    Call MD for:  extreme fatigue   Complete by: As directed    Diet - low sodium heart healthy   Complete by: As directed    Diet Carb Modified   Complete by: As  directed    Discharge instructions   Complete by: As directed    It has been a pleasure taking care of you!  You were hospitalized due to shortness of breath likely from heart failure exacerbation.  Your symptoms improved with intravenous Lasix.  Continue taking your torsemide and potassium.  Follow-up with your primary care doctor and cardiologist in 1 to 2 weeks or sooner if needed.  In addition to taking your medications as prescribed, we also recommend you avoid alcohol or over-the-counter pain medication other than plain Tylenol, limit the amount of water/fluid you drink to less than 6 cups (1500 cc) a day,  limit your sodium (salt) intake to less than 2 g (2000 mg) a day and weigh yourself daily at the same time and keeping your weight log.     Take care,   Increase activity slowly   Complete by: As directed       Allergies as of 02/07/2022       Reactions   Cefepime Other (See Comments)   AKI, see records from [place] hospitalization in January 2020.    Other reaction(s): Other (See Comments) "Shut down organs/kidneys" Note pt has tolerated Rocephin and Keflex   Kiwi Extract Shortness Of Breath, Swelling, Anaphylaxis   Morphine And Related Nausea And Vomiting   Nalbuphine    Other reaction(s): Hallucinations, Other (See Comments) "FEELS LIKE SOMETHING CRAWLING ON ME" Reaction:  Nervousness "FEELS LIKE SOMETHING CRAWLING ON ME" Reaction:  Nervousness Reaction:  Nervousness "FEELS LIKE SOMETHING CRAWLING ON ME"   Trental [pentoxifylline] Nausea And Vomiting   Toradol [ketorolac Tromethamine] Other (See Comments)   Feels like something is crawling on me   Morphine Nausea And Vomiting   Nubain [nalbuphine Hcl] Other (See Comments)   "FEELS LIKE SOMETHING CRAWLING ON ME"        Medication List     TAKE these medications    Accu-Chek Guide test strip Generic drug: glucose blood USE T0 CHECK BLOOD SUGAR 3 TIMES DAILY   Accu-Chek Softclix Lancets lancets Use to check blood glucose four times daily   Accu-Chek FastClix Lancets Misc USE DAILY AS INSTRUCTED   albuterol 108 (90 Base) MCG/ACT inhaler Commonly known as: VENTOLIN HFA INHALE 1 TO 2 PUFFS BY MOUTH EVERY SIX HOURS AS NEEDED FOR WHEEZING OR SHORTNESS OF BREATH What changed: See the new instructions.   aspirin EC 81 MG tablet Take 1 tablet (81 mg total) by mouth daily. Swallow whole.   Baqsimi One Pack 3 MG/DOSE Powd Generic drug: Glucagon Place 3 mg into the nose as needed (low blood sugar).   Cholecalciferol 25 MCG (1000 UT) tablet Take 1 tablet (1,000 Units total) by mouth daily.   Dexcom G6 Sensor Misc Inject 1 applicator into the skin as directed. Change sensor every 10 days.   Dexcom G6 Transmitter Misc Inject 1 Device into the skin as directed. Reuse 8 times with sensor changes.   gabapentin 600 MG tablet Commonly known as: NEURONTIN Take 1,200 mg by mouth 3 (three) times daily.   Glucagon Emergency 1 MG Kit Inject 1 mg into the muscle as needed  for low blood sugar.   GNP UltiCare Pen Needles 32G X 4 MM Misc Generic drug: Insulin Pen Needle USE TO inject insulin 2 TIMES DAILY   levofloxacin 750 MG/150ML Soln Commonly known as: LEVAQUIN Inject 750 mg into the vein daily. For 39 days   micafungin 100 MG Solr injection Commonly known as: MYCAMINE Inject 150 mg into the vein daily.  For 38 days   NovoLOG FlexPen 100 UNIT/ML FlexPen Generic drug: insulin aspart Inject 20 Units into the skin 3 (three) times daily with meals. What changed:  how much to take additional instructions   oxyCODONE-acetaminophen 7.5-325 MG tablet Commonly known as: PERCOCET Take 1 tablet by mouth every 8 (eight) hours as needed for severe pain.   potassium chloride SA 20 MEQ tablet Commonly known as: Klor-Con M20 Take 1 tablet (20 mEq total) by mouth daily.   ramelteon 8 MG tablet Commonly known as: ROZEREM TAKE 1 TABLET BY MOUTH AT BEDTIME   Spiriva Respimat 1.25 MCG/ACT Aers Generic drug: Tiotropium Bromide Monohydrate Inhale 2 puffs into the lungs daily.   Symbicort 160-4.5 MCG/ACT inhaler Generic drug: budesonide-formoterol inhale 2 PUFFS into THE lungs 2 TIMES DAILY   terconazole 0.4 % vaginal cream Commonly known as: TERAZOL 7 Place 1 applicator vaginally at bedtime.   torsemide 20 MG tablet Commonly known as: DEMADEX TAKE 4 TABLETS BY MOUTH ONCE A DAY What changed: See the new instructions.   Industrial/product designer Use daily   Tresiba FlexTouch 200 UNIT/ML FlexTouch Pen Generic drug: insulin degludec Inject 46 Units into the skin 2 (two) times daily. What changed: how much to take   Trulicity 3 BE/6.8HK Sopn Generic drug: Dulaglutide Inject 3 mg into the skin once a week. "Sundays   Vancomycin HCl in NaCl 1.5-0.9 GM/150ML-% Soln Inject 1.5 g into the vein daily. For 39 days-        Consultations: None  Procedures/Studies:   DG CHEST PORT 1 VIEW  Result Date: 02/05/2022 CLINICAL DATA:  PICC line placement.  EXAM: PORTABLE CHEST 1 VIEW COMPARISON:  02/05/2022. FINDINGS: Examination is limited due to motion artifact and patient's body habitus. The heart is enlarged the mediastinal contour is stable. Lung volumes are low. The pulmonary vasculature is distended. No consolidation, effusion, or pneumothorax. The distal tip of the right PICC line terminates over the superior vena cava. IMPRESSION: 1. Right-sided PICC line terminates over the superior vena cava. 2. Cardiomegaly with mild pulmonary vascular congestion. Electronically Signed   By: Laura  Taylor M.D.   On: 02/05/2022 22:30   DG Chest Port 1 View  Result Date: 02/05/2022 CLINICAL DATA:  Shortness of breath. EXAM: PORTABLE CHEST 1 VIEW COMPARISON:  Chest radiograph dated 10/16/2021. FINDINGS: Mild cardiomegaly with mild central vascular congestion. No focal consolidation, pleural effusion, pneumothorax. No acute osseous pathology. IMPRESSION: Mild cardiomegaly with mild central vascular congestion. No focal consolidation. Electronically Signed   By: Arash  Radparvar M.D.   On: 02/05/2022 01:22   ECHOCARDIOGRAM COMPLETE  Result Date: 02/06/2022    ECHOCARDIOGRAM REPORT   Patient Name:   [name] Date of Exam: 02/06/2022 Medical Rec #:  [id number]    Height:       62.0 in Accession #:    [id number]   Weight:       353.8 lb Date of Birth:  06/08/1976     BSA:          2.436 m Patient Age:    46 years     BP:           127/77 mmHg Patient Gender: F            HR:           94"  bpm. Exam Location:  Inpatient Procedure: 2D Echo Indications:    CHF  History:        Patient has prior history of Echocardiogram examinations, most  recent 02/17/2021. Risk Factors:Hypertension and Diabetes.  Sonographer:    Jefferey Pica Referring Phys: [id number] VASUNDHRA RATHORE  Sonographer Comments: Technically difficult study due to poor echo windows and patient is morbidly obese. Patient refused contrast. IMPRESSIONS  1. Technically difficult study, very limited  views. Unable to assess LV systolic function. Left ventricular endocardial border not optimally defined to evaluate regional wall motion. Left ventricular diastolic parameters are indeterminate.  2. Right ventricular systolic function was not well visualized. The right ventricular size is not well visualized.  3. The mitral valve was not well visualized. No evidence of mitral valve regurgitation.  4. The aortic valve was not well visualized. Aortic valve regurgitation is not visualized. FINDINGS  Left Ventricle: Left ventricular ejection fraction, by estimation, is Not well visualized%. The left ventricle has NWV function. Left ventricular endocardial border not optimally defined to evaluate regional wall motion. The left ventricular internal cavity size was normal in size. Suboptimal image quality limits for assessment of left ventricular hypertrophy. Left ventricular diastolic parameters are indeterminate. Right Ventricle: The right ventricular size is not well visualized. Right vetricular wall thickness was not well visualized. Right ventricular systolic function was not well visualized. Left Atrium: Left atrial size was not well visualized. Right Atrium: Right atrial size was not well visualized. Pericardium: There is no evidence of pericardial effusion. Mitral Valve: The mitral valve was not well visualized. No evidence of mitral valve regurgitation. Tricuspid Valve: The tricuspid valve is not well visualized. Tricuspid valve regurgitation is not demonstrated. Aortic Valve: The aortic valve was not well visualized. Aortic valve regurgitation is not visualized. Aortic valve peak gradient measures 10.6 mmHg. Pulmonic Valve: The pulmonic valve was not well visualized. Pulmonic valve regurgitation is not visualized. Aorta: The aortic root and ascending aorta are structurally normal, with no evidence of dilitation. IAS/Shunts: The interatrial septum was not well visualized.  LEFT VENTRICLE PLAX 2D LVOT diam:      2.20 cm LV SV:         97 LV SV Index:   40 LVOT Area:     3.80 cm  LEFT ATRIUM           Index LA Vol (A4C): 59.1 ml 24.26 ml/m  AORTIC VALVE                 PULMONIC VALVE AV Area (Vmax): 3.11 cm     PV Vmax:       0.72 m/s AV Vmax:        162.50 cm/s  PV Peak grad:  2.1 mmHg AV Peak Grad:   10.6 mmHg LVOT Vmax:      133.00 cm/s LVOT Vmean:     86.100 cm/s LVOT VTI:       0.256 m  AORTA Ao Root diam: 3.50 cm Ao Asc diam:  3.30 cm MITRAL VALVE MV Area (PHT): 5.13 cm     SHUNTS MV Decel Time: 148 msec     Systemic VTI:  0.26 m MV E velocity: 118.00 cm/s  Systemic Diam: 2.20 cm MV A velocity: 82.20 cm/s MV E/A ratio:  1.44 Oswaldo Milian MD Electronically signed by Oswaldo Milian MD Signature Date/Time: 02/06/2022/3:13:04 PM    Final        The results of significant diagnostics from this hospitalization (including imaging, microbiology, ancillary and laboratory) are listed below for reference.     Microbiology: Recent Results (from the past 240 hour(s))  Resp Panel by RT-PCR (Flu A&B, Covid) Nasopharyngeal Swab     Status:  None   Collection Time: 02/05/22  6:00 AM   Specimen: Nasopharyngeal Swab; Nasopharyngeal(NP) swabs in vial transport medium  Result Value Ref Range Status   SARS Coronavirus 2 by RT PCR NEGATIVE NEGATIVE Final    Comment: (NOTE) SARS-CoV-2 target nucleic acids are NOT DETECTED.  The SARS-CoV-2 RNA is generally detectable in upper respiratory specimens during the acute phase of infection. The lowest concentration of SARS-CoV-2 viral copies this assay can detect is 138 copies/mL. A negative result does not preclude SARS-Cov-2 infection and should not be used as the sole basis for treatment or other patient management decisions. A negative result may occur with  improper specimen collection/handling, submission of specimen other than nasopharyngeal swab, presence of viral mutation(s) within the areas targeted by this assay, and inadequate number of  viral copies(<138 copies/mL). A negative result must be combined with clinical observations, patient history, and epidemiological information. The expected result is Negative.  Fact Sheet for Patients:  EntrepreneurPulse.com.au  Fact Sheet for Healthcare Providers:  IncredibleEmployment.be  This test is no t yet approved or cleared by the [place] FDA and  has been authorized for detection and/or diagnosis of SARS-CoV-2 by FDA under an Emergency Use Authorization (EUA). This EUA will remain  in effect (meaning this test can be used) for the duration of the COVID-19 declaration under Section 564(b)(1) of the Act, 21 U.S.C.section 360bbb-3(b)(1), unless the authorization is terminated  or revoked sooner.       Influenza A by PCR NEGATIVE NEGATIVE Final   Influenza B by PCR NEGATIVE NEGATIVE Final    Comment: (NOTE) The Xpert Xpress SARS-CoV-2/FLU/RSV plus assay is intended as an aid in the diagnosis of influenza from Nasopharyngeal swab specimens and should not be used as a sole basis for treatment. Nasal washings and aspirates are unacceptable for Xpert Xpress SARS-CoV-2/FLU/RSV testing.  Fact Sheet for Patients: EntrepreneurPulse.com.au  Fact Sheet for Healthcare Providers: IncredibleEmployment.be  This test is not yet approved or cleared by the [place] FDA and has been authorized for detection and/or diagnosis of SARS-CoV-2 by FDA under an Emergency Use Authorization (EUA). This EUA will remain in effect (meaning this test can be used) for the duration of the COVID-19 declaration under Section 564(b)(1) of the Act, 21 U.S.C. section 360bbb-3(b)(1), unless the authorization is terminated or revoked.  Performed at [name], [place] [address], [place], [place] [address]      Labs:  CBC: Recent Labs  Lab 02/05/22 0110 02/06/22 0338 02/07/22 0357  WBC 12.0* 10.1  8.7  HGB 12.2 11.1* 10.1*  HCT 38.8 36.1 31.6*  MCV 95.3 97.8 97.2  PLT 331 314 289   BMP &GFR Recent Labs  Lab 02/05/22 0110 02/06/22 0338 02/07/22 0357  NA 136 134* 135  K 3.9 3.9 3.6  CL 95* 97* 98  CO2 '28 28 29  ' GLUCOSE 252* 298* 226*  BUN 53* 64* 55*  CREATININE 1.41* 1.35* 1.17*   1.21*  CALCIUM 8.6* 8.4* 8.4*  MG  --  2.3 2.3  PHOS  --  5.8* 4.1   Estimated Creatinine Clearance: 88.3 mL/min (A) (by C-G formula based on SCr of 1.21 mg/dL (H)). Liver & Pancreas: Recent Labs  Lab 02/06/22 0338 02/07/22 0357  ALBUMIN 3.1* 2.9*   No results for input(s): LIPASE, AMYLASE in the last 168 hours. No results for input(s): AMMONIA in the last 168 hours. Diabetic: No results for input(s): HGBA1C in the last 72 hours. Recent Labs  Lab 02/06/22 1219 02/06/22 1739  02/06/22 2131 02/07/22 0915 02/07/22 1117  GLUCAP 316* 237* 255* 203* 269*   Cardiac Enzymes: No results for input(s): CKTOTAL, CKMB, CKMBINDEX, TROPONINI in the last 168 hours. No results for input(s): PROBNP in the last 8760 hours. Coagulation Profile: No results for input(s): INR, PROTIME in the last 168 hours. Thyroid Function Tests: No results for input(s): TSH, T4TOTAL, FREET4, T3FREE, THYROIDAB in the last 72 hours. Lipid Profile: No results for input(s): CHOL, HDL, LDLCALC, TRIG, CHOLHDL, LDLDIRECT in the last 72 hours. Anemia Panel: No results for input(s): VITAMINB12, FOLATE, FERRITIN, TIBC, IRON, RETICCTPCT in the last 72 hours. Urine analysis:    Component Value Date/Time   COLORURINE YELLOW 12/19/2021 0932   APPEARANCEUR CLEAR 12/19/2021 0932   LABSPEC 1.010 12/19/2021 0932   PHURINE 5.0 12/19/2021 0932   GLUCOSEU >=500 (A) 12/19/2021 0932   HGBUR TRACE (A) 12/19/2021 0932   BILIRUBINUR NEGATIVE 12/19/2021 0932   BILIRUBINUR negative 09/29/2021 1220   KETONESUR NEGATIVE 12/19/2021 0932   PROTEINUR NEGATIVE 12/19/2021 0932   UROBILINOGEN 0.2 09/29/2021 1220   UROBILINOGEN 0.2  08/22/2015 0225   NITRITE NEGATIVE 12/19/2021 0932   LEUKOCYTESUR TRACE (A) 12/19/2021 0932   Sepsis Labs: Invalid input(s): PROCALCITONIN, LACTICIDVEN   Time coordinating discharge: 45 minutes  SIGNED:  Mercy Riding, MD  [facility] 02/07/2022, 4:07 PM

## 2022-02-07 NOTE — Progress Notes (Signed)
Patient refused having vital signs checked. ? ?Angie Fava, RN  ?

## 2022-02-07 NOTE — Progress Notes (Signed)
Pt has a Single lumen picc line, placed at [facility], in her left upper arm;  picc was flushed per policy with 89HT NS, followed by 250 units of heparin;  pt requested that the "cap be loosened so I can put my own extension on for the antibiotics when I get home."  Explained to pt that this was not hospital policy, nor was it safe for her due to the risk of an air embolism , or bleeding out, and also infection if the clamp were to come open. The cap was left intact to the picc line.  Spoke with RN about the pt's request.  Waiting for [facility] to transport pt.   ?

## 2022-02-07 NOTE — Progress Notes (Signed)
Patient and patient's belongings picked up by [facility]. ? ?Angie Fava, RN  ?

## 2022-02-11 ENCOUNTER — Other Ambulatory Visit: Payer: Self-pay | Admitting: Family Medicine

## 2022-02-11 ENCOUNTER — Other Ambulatory Visit: Payer: Self-pay

## 2022-02-11 MED ORDER — GABAPENTIN 600 MG PO TABS: 1200.0000 mg | ORAL_TABLET | Freq: Three times a day (TID) | ORAL | 2 refills | Status: DC

## 2022-02-11 NOTE — Telephone Encounter (Signed)
It appears Dr. Andria Frames refused it back at the end of January when he covered for me while I was on leave. ?I am fine with renewing the rx. Will send in. ? ?[name], MD  ?

## 2022-02-11 NOTE — Telephone Encounter (Signed)
Patient calls nurse line requesting a refill on Gabapentin. Patient states, "I want to know why this request was denied."  ? ?I do not see where we denied this medication. Or received a request.  ? ?Will forward to PCP for advisement.  ?

## 2022-02-15 ENCOUNTER — Encounter: Payer: Self-pay | Admitting: Physical Medicine and Rehabilitation

## 2022-02-15 ENCOUNTER — Other Ambulatory Visit: Payer: Self-pay

## 2022-02-15 ENCOUNTER — Encounter
Payer: Medicaid Other | Attending: Physical Medicine and Rehabilitation | Admitting: Physical Medicine and Rehabilitation

## 2022-02-15 VITALS — BP 111/78 | HR 83 | Temp 98.2°F | Ht 62.0 in

## 2022-02-15 DIAGNOSIS — Z89512 Acquired absence of left leg below knee: Secondary | ICD-10-CM | POA: Diagnosis present

## 2022-02-15 DIAGNOSIS — G8929 Other chronic pain: Secondary | ICD-10-CM | POA: Insufficient documentation

## 2022-02-15 DIAGNOSIS — M25561 Pain in right knee: Secondary | ICD-10-CM | POA: Insufficient documentation

## 2022-02-15 DIAGNOSIS — R5381 Other malaise: Secondary | ICD-10-CM | POA: Insufficient documentation

## 2022-02-15 DIAGNOSIS — M25562 Pain in left knee: Secondary | ICD-10-CM | POA: Insufficient documentation

## 2022-02-15 DIAGNOSIS — Z7409 Other reduced mobility: Secondary | ICD-10-CM | POA: Diagnosis present

## 2022-02-15 DIAGNOSIS — Z6841 Body Mass Index (BMI) 40.0 and over, adult: Secondary | ICD-10-CM | POA: Insufficient documentation

## 2022-02-15 MED ORDER — DULOXETINE HCL 30 MG PO CPEP: 30.0000 mg | ORAL_CAPSULE | Freq: Every day | ORAL | 5 refills | Status: DC

## 2022-02-15 NOTE — Patient Instructions (Signed)
Pt is a 47 yr old female with hx of COPD; Cellulitis and R foot osteomyelitis s/p TMA/toes amputations- - also hypoventilation syndrome with BMI of 65-Has L BKA on LLE- has prosthesis- 2019 - DM- last A1c of 13- says it's lower now because doing better. Has Endo at [facility].  ? ?Here for evaluation of chronic pain - ? ? ?Cymbalta/Duloxetine- 30 mg daily x 1 week; then 60 mg daily- for nerve pain as well as back/knee pain.  ?  Duloxetine /Cymbalta 30 mg daily; nightly x 1 week- #60 5 refills ? Then 60 mg daily-nightly- for nerve pain ? 1% of patients can have nausea with Duloxetine- call me if needs an anti-nausea medicine. ?Can also cause mild dry mouth/dry eyes and mild constipation.  ?2. Pt is getting Percocet from PCP- I don't see a reason to stop- at this time, with information I have,  but I will not be taking over right now for this patient.  ?3. Con't Gabapentin 1200 mg TID- hast refill was last week from PCP.  ? ?4. Hasn't had a good response to steroid injections of B/L knees, but might benefit from gel injections B/L ot help knee pain. Suggest Orthopedics referral- if hasn't been done.  ? ?5.  Will look into other meds if Cymbalta not enough in the future. Trileptal vs Keppra.  ? ?6. F/U in 3 months-  ?

## 2022-02-15 NOTE — Progress Notes (Signed)
Subjective:    Patient ID: [name], female    DOB: 1976-09-17, 46 y.o.   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 Has Endo at [facility].   Here for evaluation of chronic pain     Vira Agar- god daughter and helps as aide.   Says has pain in knees B/L - 'in pain constantly".  Due to bad arthritis-  The xrays from 03/30/21-  Shows mild Osteoarthrosis.    Can now WB on RLE - last week 2/27-  Goes back to surgeon on 02/22/22. Not draining anymore.  Was given Percocet in the hospital  and that would help some. Is currently on Rx for Percocet 7.5mg /325 mg #90 by PCP.  Used to take Norco 10/325, however was changed "sometime last year".   B/L knees pain Has tried Cortisone shots of B/L- didn't help at all-  Hurts "so bad that cannot sleep"- doesn't go to sleep until 3-4am usually-   Hasn't been on SSRI/antidepressants for years.    Doesn't really have phantom pain much anymore.   Has to wear compression TEDs during the day daily- due to pain of Legs if doesn't.   Tried: Gabapentin 1200 mg TID Hasn't tried any other nerve pain meds Hasn't tried Cymbalta/Trileptal.    Social Hx: Declined to do PHQ-9- even though had anxiety/depression. But not on any meds.   Pain Inventory Average Pain 8 Pain Right Now 8 My pain is constant, sharp, and tingling  In the last 24 hours, has pain interfered with the following? General activity 6 Relation with others 6 Enjoyment of life 8 What TIME of day is your pain at its worst? night Sleep (in general) Poor  Pain is worse with: walking, sitting, inactivity, standing, and some activites Pain improves with:  Nothing helps pain Relief from Meds: 0  walk with assistance use a walker how many minutes can you  walk? 5 ability to climb steps?  no do you drive?  no use a wheelchair transfers alone  disabled: date disabled 2010 I need assistance with the following:  dressing, bathing, toileting, meal prep, household duties, and shopping Do you have any goals in this area?  yes  trouble walking spasms depression anxiety  Any changes since last visit?  no  Any changes since last visit?  no    Family History  Problem Relation Age of Onset   Diabetes Mother    Hyperlipidemia Mother    Depression Mother    GER disease Mother    Allergic rhinitis Mother    Restless legs syndrome Mother    Heart attack Paternal Uncle    Heart disease Paternal Grandmother    Heart attack Paternal Grandmother    Heart attack Paternal Grandfather    Heart disease Paternal Grandfather    Heart attack Father    Migraines Sister    Cancer Maternal Grandmother        COLON   Hypertension Maternal Grandmother    Hyperlipidemia Maternal Grandmother    Diabetes Maternal Grandmother    Other Maternal Grandfather        GUN SHOT   Anxiety disorder Sister    Asthma Child    Social History   Socioeconomic History   Marital status:  Married    Spouse name: Not on file   Number of children: Not on file   Years of education: Not on file   Highest education level: Not on file  Occupational History   Not on file  Tobacco Use   Smoking status: Former    Packs/day: 0.25    Years: 15.00    Pack years: 3.75    Types: Cigarettes    Quit date: 12/06/2005    Years since quitting: 16.2   Smokeless tobacco: Never   Tobacco comments:    smokes for a couple of months  Vaping Use   Vaping Use: Never used  Substance and Sexual Activity   Alcohol use: No    Alcohol/week: 0.0 standard drinks   Drug use: Not Currently    Comment: OD attempts on home meds     Sexual activity: Yes    Partners: Male  Other Topics Concern   Not on file  Social History Narrative   Lives in [place] with her fiance and 44 yr old dtr.    Social Determinants of Health   Financial Resource Strain: High Risk   Difficulty of Paying Living Expenses: Hard  Food Insecurity: Food Insecurity Present   Worried About Programme researcher, broadcasting/film/video in the Last Year: Often true   Barista in the Last Year: Often true  Transportation Needs: No Transportation Needs   Lack of Transportation (Medical): No   Lack of Transportation (Non-Medical): No  Physical Activity: Not on file  Stress: Stress Concern Present   Feeling of Stress : Very much  Social Connections: Not on file   Past Surgical History:  Procedure Laterality Date   AMPUTATION Left 02/01/2018   Procedure: LEFT FOURTH AND 5TH TOE RAY AMPUTATION;  Surgeon: Nadara Mustard, MD;  Location: [facility] OR;  Service: Orthopedics;  Laterality: Left;   AMPUTATION Left 03/03/2018   Procedure: LEFT BELOW KNEE AMPUTATION;  Surgeon: Nadara Mustard, MD;  Location: [facility] OR;  Service: Orthopedics;  Laterality: Left;   CARPAL TUNNEL RELEASE Bilateral    CESAREAN SECTION  2007   CORONARY ANGIOGRAPHY N/A 11/26/2020   Procedure: CORONARY ANGIOGRAPHY;  Surgeon: Tonny Bollman, MD;  Location: [facility] INVASIVE CV LAB;  Service: Cardiovascular;  Laterality: N/A;   INCISION AND DRAINAGE ABSCESS Left 10/16/2021   Procedure: INCISION AND DRAINAGE ABSCESS;  Surgeon: Fritzi Mandes, MD;  Location: [facility] OR;  Service: General;  Laterality: Left;   INCISION AND DRAINAGE PERIRECTAL ABSCESS Left 05/18/2019   Procedure: IRRIGATION AND DEBRIDEMENT OF PANNIS ABSCESS, POSSIBLE DEBRIDEMENT OF BUTTOCK WOUND;  Surgeon: Manus Rudd, MD;  Location: [facility] OR;  Service: General;  Laterality: Left;   IRRIGATION AND DEBRIDEMENT BUTTOCKS Left 05/17/2019   Procedure: IRRIGATION AND DEBRIDEMENT BUTTOCKS;  Surgeon: Manus Rudd, MD;  Location: [facility] OR;  Service: General;  Laterality: Left;   KNEE ARTHROSCOPY Right 07/17/2010   LEFT HEART CATHETERIZATION WITH CORONARY ANGIOGRAM N/A 07/27/2012   Procedure: LEFT HEART CATHETERIZATION WITH  CORONARY ANGIOGRAM;  Surgeon: Tonny Bollman, MD;  Location: [facility] CATH LAB;  Service: Cardiovascular;  Laterality: N/A;   MASS EXCISION N/A 06/29/2013   Procedure:  WIDE LOCAL EXCISION OF POSTERIOR NECK ABSCESS;  Surgeon: Axel Filler, MD;  Location: [facility] OR;  Service: General;  Laterality: N/A;   REPAIR KNEE LIGAMENT Left    "fixed ligaments and chipped patella"   right transmetatarsal amputation      Past Medical History:  Diagnosis Date   Acute osteomyelitis of ankle or foot, left (  [name]) 01/31/2018   Alveolar hypoventilation    Anemia    not on iron pill   Asthma    Bipolar 2 disorder ([name])    Carpal tunnel syndrome on right    recurrent   Cellulitis 08/2010-08/2011   Chronic pain    COPD (chronic obstructive pulmonary disease) ([name])    Symbicort daily and Proventil as needed   Costochondritis    Depression    Diabetes mellitus type II, uncontrolled 2000   Type 2, Uncontrolled.Takes Lantus daily.Fasting blood sugar runs 150   Drug-seeking behavior    GERD (gastroesophageal reflux disease)    takes Pantoprazole and Zantac daily   HLD (hyperlipidemia)    takes Atorvastatin daily   Hypertension    takes Lisinopril and Coreg daily   Morbid obesity ([name])    Nocturia    OSA on CPAP    Peripheral neuropathy    takes Gabapentin daily   Pneumonia    "walking" several yrs ago and as a baby (12/05/2018)   Rectal fissure    Restless leg    SVT (supraventricular tachycardia) ([name])    Syncope 02/25/2016   Urinary frequency    Urinary incontinence 10/23/2020   Varicose veins    Right medial thigh and Left leg    BP 111/78   Pulse 83   Temp 98.2 F (36.8 C)   Ht 5\' 2"  (1.575 m)   SpO2 94%   BMI 64.72 kg/m   Opioid Risk Score:   Fall Risk Score:  `1  Depression screen PHQ 2/9  Depression screen [facility] 2/9 01/07/2022 09/29/2021 08/11/2021 05/11/2021 04/24/2021 03/31/2021 02/10/2021  Decreased Interest 0 1 1 1 1 1 1   Down, Depressed, Hopeless 2 2 1  0 0 0 1  PHQ - 2 Score 2 3 2 1 1 1 2    Altered sleeping 1 1 1 1 1 1 1   Tired, decreased energy 1 2 1 1 1 2 1   Change in appetite 1 1 0 2 0 0 0  Feeling bad or failure about yourself  3 1 0 0 0 0 1  Trouble concentrating 1 2 0 0 0 1 1  Moving slowly or fidgety/restless 0 0 0 0 0 0 1  Suicidal thoughts 0 0 0 0 0 0 0  PHQ-9 Score 9 10 4 5 3 5 7   Difficult doing work/chores - - - - - - -  Some recent data might be hidden     Review of Systems  Constitutional: Negative.   HENT: Negative.    Eyes: Negative.   Respiratory:  Positive for apnea and shortness of breath.   Cardiovascular:  Positive for leg swelling.  Gastrointestinal:  Positive for nausea.  Endocrine: Negative.   Genitourinary:  Positive for decreased urine volume.  Musculoskeletal:  Positive for gait problem.  Skin: Negative.   Allergic/Immunologic: Negative.   Hematological: Negative.   Psychiatric/Behavioral:  Positive for dysphoric mood and sleep disturbance. The patient is nervous/anxious.       Objective:   Physical Exam Awake, alert, appropriate, tangential somewhat; in manual w/c- accompanied by god daughter; NAD BMI 65 Wearing L BKA prosthesis  MS: RLE- HF 5-/5; KE/KF 5-/5; has drop foot on RLE- at ankle- 0/5 DF and 0/5 PF Has TMA amputation on R foot LLE- 5/5 in HF/KE/KF- wearing L prosthesis.   Neuro: Decreased from proximal calf downwards on R foot Intact in LLE- with L BKA.       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 Has Endo at [facility].   Here for evaluation of chronic pain -   Cymbalta/Duloxetine- 30 mg daily x 1 week; then 60 mg daily- for nerve pain as well as back/knee pain.    Duloxetine /Cymbalta 30 mg daily; nightly x 1 week- #60 5 refills  Then 60 mg daily-nightly- for nerve pain  1%  of patients can have nausea with Duloxetine- call me if needs an anti-nausea medicine. Can also cause mild dry mouth/dry eyes and mild constipation.  2. Pt is getting Percocet from PCP- I don't see a reason to stop- at this time, with information I have,  but I will not be taking over right now for this patient.  3. Con't Gabapentin 1200 mg TID- hast refill was last week from PCP.   4. Hasn't had a good response to steroid injections of B/L knees, but might benefit from gel injections B/L ot help knee pain. Suggest Orthopedics referral- if hasn't been done.   5.  Will look into other meds if Cymbalta not enough in the future. Trileptal vs Keppra.   6. F/U in 3 months-

## 2022-02-16 ENCOUNTER — Encounter: Payer: Self-pay | Admitting: Family Medicine

## 2022-02-16 ENCOUNTER — Ambulatory Visit (INDEPENDENT_AMBULATORY_CARE_PROVIDER_SITE_OTHER): Payer: Medicaid Other | Admitting: Family Medicine

## 2022-02-16 VITALS — BP 168/86 | HR 85 | Ht 62.0 in

## 2022-02-16 DIAGNOSIS — Z7409 Other reduced mobility: Secondary | ICD-10-CM

## 2022-02-16 DIAGNOSIS — G8929 Other chronic pain: Secondary | ICD-10-CM

## 2022-02-16 MED ORDER — OXYCODONE-ACETAMINOPHEN 7.5-325 MG PO TABS: 1.0000 | ORAL_TABLET | Freq: Four times a day (QID) | ORAL | 0 refills | Status: DC | PRN

## 2022-02-16 NOTE — Patient Instructions (Addendum)
It was great to see you again today! ? ?Try taking percocet four times per day, every 6 hours as needed. ?Sent in higher number of pills for you. ?Please use caution and let me know immediately if this makes you too sleepy ? ?Referring to physical therapy and OT ?Will have nurses check on wheelchair and oxygen concentrator ? ?Follow up with me in 1 mo ? ?Be well, ?Dr. [name]  ?

## 2022-02-16 NOTE — Progress Notes (Signed)
?  Date of Visit: 02/16/2022  ? ?SUBJECTIVE:  ? ?HPI: ? ?[name] presents today for follow up. ? ?Chronic pain - saw pain management who started her on duloxetine. Has not seen any improvement with this but is still titrating up on it. Taking oxycodone-acetaminophen 7.5-'325mg'$  three times daily. Has pain in legs; aching and stabbing. Pain medication wears off before it's time for her next dose.  ? ?Has appointment on Monday with orthopedics at [facility] for f/u of her foot. ? ?Care management needs: ?Desires referral to outpatient physical therapy and OT.  ?Needs forms completed today for handicap placard and SCAT bus. ?Hasn't yet heard about electric wheelchair ?Still needs portable O2 concentrator ? ?OBJECTIVE:  ? ?BP (!) 168/86   Pulse 85   Ht '5\' 2"'$  (1.575 m)   LMP  (LMP Unknown)   SpO2 100%   BMI 64.72 kg/m?  ?Gen: no acute distress, pleasant cooperative ?HEENT: normocephalic, atraumatic  ?Lungs: normal work of breathing  ?Neuro: alert, grossly nonfocal, speech normal ? ?ASSESSMENT/PLAN:  ? ?Care management ?Physical therapy, OT outpatient referrals placed ?Will message RN team about oxygen concentrator and electric wheelchair ?Forms completed today for SCAT and handicap placard ? ?Chronic pain ?Medication not lasting entire time between doses ?Reasonable to increase to four times a day in order to improve quality of life, but advised patient I will not escalate beyond this as it would be past a safe dose range. Daily MME with 7.'5mg'$  oxycodone 4 times a day = '45mg'$ , which is within the "safer" range per the CDC. Counseled on risk of sedation. Patient is very compliant with her bipap at home. Follow up in 1 month.  ? ?FOLLOW UP: ?Follow up in 1 mo for above issues ? ?[place]. [name], MD ?[facility] ?

## 2022-02-17 ENCOUNTER — Encounter: Payer: Self-pay | Admitting: Physical Medicine and Rehabilitation

## 2022-02-17 MED ORDER — ONDANSETRON HCL 4 MG PO TABS: 4.0000 mg | ORAL_TABLET | Freq: Three times a day (TID) | ORAL | 1 refills | Status: DC | PRN

## 2022-02-17 NOTE — Telephone Encounter (Signed)
Spoke with pharmacy and the patient.  ? ?Patient reports she misread the bottle originally and pharmacy reports they dispensed prescribed medication.  ? ?Nothing further.  ?

## 2022-02-25 ENCOUNTER — Telehealth: Payer: Self-pay | Admitting: *Deleted

## 2022-02-25 NOTE — Telephone Encounter (Signed)
[employer] [facility] calls to check status on DME PA form for patient.  I do not see in providers box or to be faxed pile.  Asked her to resend to my attention. Christen Bame, CMA ? ?

## 2022-02-25 NOTE — Telephone Encounter (Signed)
Form placed in PCP's box. [name], CMA ? ?

## 2022-02-26 NOTE — Telephone Encounter (Signed)
Order signed and placed in outgoing fax pile ?Belinda Rio, MD  ?

## 2022-03-09 ENCOUNTER — Encounter: Payer: Self-pay | Admitting: Rehabilitation

## 2022-03-09 ENCOUNTER — Other Ambulatory Visit: Payer: Self-pay

## 2022-03-09 ENCOUNTER — Ambulatory Visit: Payer: Medicaid Other | Attending: Family Medicine | Admitting: Rehabilitation

## 2022-03-09 DIAGNOSIS — Z7409 Other reduced mobility: Secondary | ICD-10-CM | POA: Insufficient documentation

## 2022-03-09 DIAGNOSIS — R2681 Unsteadiness on feet: Secondary | ICD-10-CM | POA: Insufficient documentation

## 2022-03-09 DIAGNOSIS — R2689 Other abnormalities of gait and mobility: Secondary | ICD-10-CM | POA: Diagnosis present

## 2022-03-09 DIAGNOSIS — R293 Abnormal posture: Secondary | ICD-10-CM | POA: Diagnosis present

## 2022-03-09 DIAGNOSIS — M6281 Muscle weakness (generalized): Secondary | ICD-10-CM | POA: Insufficient documentation

## 2022-03-09 NOTE — Therapy (Signed)
? ?OUTPATIENT PHYSICAL THERAPY PROSTHETICS EVALUATION ? ? ?Patient Name: [name] ?MRN: [id number] ?DOB:1976-07-22, 46 y.o., female ?Today's Date: 03/09/2022 ? ?PCP: [name], MD ?REFERRING PROVIDER: [name], MD ? ? PT End of Session - 03/09/22 1201   ? ? Visit Number 1   ? Number of Visits 15   ? Authorization Type Medicaid (waiting for approval)   ? PT Start Time 1015   ? PT Stop Time 1100   ? PT Time Calculation (min) 45 min   ? Activity Tolerance Patient tolerated treatment well   ? Behavior During Therapy [facility] for tasks assessed/performed   ? ?  ?  ? ?  ? ? ?Past Medical History:  ?Diagnosis Date  ? Acute osteomyelitis of ankle or foot, left ([place]) 01/31/2018  ? Alveolar hypoventilation   ? Anemia   ? not on iron pill  ? Asthma   ? Bipolar 2 disorder ([place])   ? Carpal tunnel syndrome on right   ? recurrent  ? Cellulitis 08/2010-08/2011  ? Chronic pain   ? COPD (chronic obstructive pulmonary disease) ([place])   ? Symbicort daily and Proventil as needed  ? Costochondritis   ? Depression   ? Diabetes mellitus type II, uncontrolled 2000  ? Type 2, Uncontrolled.Takes Lantus daily.Fasting blood sugar runs 150  ? Drug-seeking behavior   ? GERD (gastroesophageal reflux disease)   ? takes Pantoprazole and Zantac daily  ? HLD (hyperlipidemia)   ? takes Atorvastatin daily  ? Hypertension   ? takes Lisinopril and Coreg daily  ? Morbid obesity ([place])   ? Nocturia   ? OSA on CPAP   ? Peripheral neuropathy   ? takes Gabapentin daily  ? Pneumonia   ? "walking" several yrs ago and as a baby (12/05/2018)  ? Rectal fissure   ? Restless leg   ? SVT (supraventricular tachycardia) ([place])   ? Syncope 02/25/2016  ? Urinary frequency   ? Urinary incontinence 10/23/2020  ? Varicose veins   ? Right medial thigh and Left leg   ? ?Past Surgical History:  ?Procedure Laterality Date  ? AMPUTATION Left 02/01/2018  ? Procedure: LEFT FOURTH AND 5TH TOE RAY AMPUTATION;  Surgeon: [name], MD;  Location: [place];  Service:  Orthopedics;  Laterality: Left;  ? AMPUTATION Left 03/03/2018  ? Procedure: LEFT BELOW KNEE AMPUTATION;  Surgeon: [name], MD;  Location: [name];  Service: Orthopedics;  Laterality: Left;  ? CARPAL TUNNEL RELEASE Bilateral   ? CESAREAN SECTION  2007  ? CORONARY ANGIOGRAPHY N/A 11/26/2020  ? Procedure: CORONARY ANGIOGRAPHY;  Surgeon: Sherren Mocha, MD;  Location: [place] CV LAB;  Service: Cardiovascular;  Laterality: N/A;  ? FOOT AMPUTATION Right   ? INCISION AND DRAINAGE ABSCESS Left 10/16/2021  ? Procedure: INCISION AND DRAINAGE ABSCESS;  Surgeon: Dwan Bolt, MD;  Location: [place];  Service: General;  Laterality: Left;  ? INCISION AND DRAINAGE PERIRECTAL ABSCESS Left 05/18/2019  ? Procedure: IRRIGATION AND DEBRIDEMENT OF PANNIS ABSCESS, POSSIBLE DEBRIDEMENT OF BUTTOCK WOUND;  Surgeon: Donnie Mesa, MD;  Location: [place];  Service: General;  Laterality: Left;  ? IRRIGATION AND DEBRIDEMENT BUTTOCKS Left 05/17/2019  ? Procedure: IRRIGATION AND DEBRIDEMENT BUTTOCKS;  Surgeon: Donnie Mesa, MD;  Location: [place];  Service: General;  Laterality: Left;  ? KNEE ARTHROSCOPY Right 07/17/2010  ? LEFT HEART CATHETERIZATION WITH CORONARY ANGIOGRAM N/A 07/27/2012  ? Procedure: LEFT HEART CATHETERIZATION WITH CORONARY ANGIOGRAM;  Surgeon: Sherren Mocha, MD;  Location: [facility]  CATH LAB;  Service: Cardiovascular;  Laterality: N/A;  ? MASS EXCISION N/A 06/29/2013  ? Procedure:  WIDE LOCAL EXCISION OF POSTERIOR NECK ABSCESS;  Surgeon: Ralene Ok, MD;  Location: [place];  Service: General;  Laterality: N/A;  ? REPAIR KNEE LIGAMENT Left   ? "fixed ligaments and chipped patella"  ? right transmetatarsal amputation     ? ?Patient Active Problem List  ? Diagnosis Date Noted  ? Acute on chronic diastolic (congestive) heart failure ([place]) 02/05/2022  ? Debility 02/05/2022  ? Adrenal nodule ([place]) 01/07/2022  ? Iritis   ? Diabetic ulcer of right foot ([place]) 12/19/2021  ? Chronic kidney disease, stage 3a ([place]) 10/19/2021  ?  Diastolic HF (heart failure) ([place]) 10/19/2021  ? Abscess 10/16/2021  ? Abscess of groin, left   ? Impaired mobility 07/07/2021  ? Chest pain with low risk for cardiac etiology 05/11/2021  ? Morbid obesity with BMI of 60.0-69.9, adult ([place]) 04/15/2021  ? Hypoxia 04/15/2021  ? UTI (urinary tract infection) 04/15/2021  ? Chronic pain 04/15/2021  ? Pneumonia due to COVID-19 virus 04/14/2021  ? Urinary incontinence 10/23/2020  ? Elevated troponin   ? Acute respiratory failure with hypoxia and hypercarbia ([place]) 06/15/2020  ? Chronic respiratory failure with hypoxia ([place]) 06/14/2020  ? Blister of foot 04/21/2020  ? Exposure to mold 04/21/2020  ? Constipation 04/03/2020  ? Left shoulder pain 06/23/2019  ? Dysuria 06/14/2019  ? Hyperglycemia   ? 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[place]) 04/11/2019  ? Leukocytosis   ? Allergic rhinitis 03/06/2018  ? Class 3 severe obesity due to excess calories with serious comorbidity and body mass index (BMI) of 50.0 to 59.9 in adult [facility])   ? AKI (acute kidney injury) ([place])   ? Decreased pedal pulses 06/10/2017  ? Unilateral primary osteoarthritis, right knee 10/22/2016  ? Primary osteoarthritis of first carpometacarpal joint of left hand 07/30/2016  ? Diabetic neuropathy ([place]) 07/14/2016  ? Nausea 03/17/2016  ? De Quervain's tenosynovitis, bilateral 11/01/2015  ? Vitamin D deficiency 09/05/2015  ? Recurrent candidiasis of vagina 09/05/2015  ? Varicose veins of leg with complications 25/42/7062  ? Restless leg syndrome 10/17/2014  ? Chronic sinusitis 07/18/2014  ? Insomnia 08/14/2013  ? Encounter for chronic pain management 06/30/2013  ? HLD (hyperlipidemia) 11/19/2012  ? Angina pectoris ([place]) 06/27/2012  ? Abscess of skin and subcutaneous tissue 11/04/2011  ? Right carpal tunnel syndrome 09/01/2011  ? Chronic pain of both knees 09/01/2011  ? Insulin dependent type 2 diabetes mellitus ([place]) 05/22/2008  ? Morbid obesity ([place]) 05/22/2008  ? Obesity hypoventilation syndrome ([place]) 05/22/2008  ? Mood  disorder ([place]) 05/22/2008  ? OSA (obstructive sleep apnea) 05/22/2008  ? Hypertension 05/22/2008  ? Asthma, chronic 05/22/2008  ? GERD 05/22/2008  ? ? ?ONSET DATE: 02/24/2022 (referral date) ? ?REFERRING DIAG: Z74.09 (ICD-10-CM) - Impaired mobility  ? ?THERAPY DIAG:  ?Other abnormalities of gait and mobility ? ?Unsteadiness on feet ? ?Abnormal posture ? ?Muscle weakness (generalized) ? ?SUBJECTIVE:  ? ?SUBJECTIVE STATEMENT: ?I want to learn how to walk again.  Pt is familiar to this clinic but has been about 2 years since being here.  Has bariatric rolling walker at home.  Has AFO at home, and toe filler is attached to AFO.  Is walking in/out of bathroom with walker at this time.  Was just cleared by MD for WB on R foot.  ?Pt accompanied by:  caregiver ? ?PERTINENT HISTORY: 6 weeks status post right foot revision, Type ?2 diabetes mellitus with  foot ulcer, Non-pressure chronic ?ulcer of right heel and midfoot with fat layer exposed, ?Other chronic osteomyelitis, right ankle and foot, L BKA  ? ? ?PAIN:  ?Are you having pain? Yes: NPRS scale: 8/10 ?Pain location: R foot  ?Pain description: tingling  ?Aggravating factors: putting pressure on foot  ?Relieving factors: rest ? ? ?PRECAUTIONS: Fall ? ?WEIGHT BEARING RESTRICTIONS No ? ?FALLS: Has patient fallen in last 6 months? No ? ?LIVING ENVIRONMENT: ?Lives with: lives with their daughter (has caregiver (Takara) 5 days a week for 8 hours, every other weekend for 4 hours each day) ?Lives in: House/apartment ?Home Access: Level entry ?Home layout: One level ?Stairs: No ?Has following equipment at home: Gilford Rile - 2 wheeled, Wheelchair (power), and Electronics engineer ? ?PLOF: Requires assistive device for independence, Needs assistance with ADLs, and Needs assistance with homemaking ? ?PATIENT GOALS To learn to walk again.  ? ?OBJECTIVE:  ? ?DIAGNOSTIC FINDINGS: n/a ? ?COGNITION: ?Overall cognitive status: Within functional limits for tasks assessed ?  ?SENSATION: ?Light touch:  Impaired  RLE numb in foot, dec sensation to light touch in lower R leg.  ? ?MUSCLE LENGTH: ?Hamstrings: Right lacking several deg deg ? ? ? ? ?POSTURE: rounded shoulders, forward head, and posterior pelvic tilt ? ?

## 2022-03-16 ENCOUNTER — Other Ambulatory Visit: Payer: Self-pay | Admitting: Family Medicine

## 2022-03-16 DIAGNOSIS — I5033 Acute on chronic diastolic (congestive) heart failure: Secondary | ICD-10-CM

## 2022-03-18 ENCOUNTER — Ambulatory Visit (INDEPENDENT_AMBULATORY_CARE_PROVIDER_SITE_OTHER): Payer: Medicaid Other

## 2022-03-18 ENCOUNTER — Ambulatory Visit (INDEPENDENT_AMBULATORY_CARE_PROVIDER_SITE_OTHER): Payer: Medicaid Other | Admitting: Family Medicine

## 2022-03-18 ENCOUNTER — Encounter: Payer: Self-pay | Admitting: Family Medicine

## 2022-03-18 VITALS — BP 143/84 | HR 103 | Temp 97.9°F | Wt 344.8 lb

## 2022-03-18 VITALS — BP 141/86

## 2022-03-18 DIAGNOSIS — G8929 Other chronic pain: Secondary | ICD-10-CM | POA: Diagnosis not present

## 2022-03-18 DIAGNOSIS — G4700 Insomnia, unspecified: Secondary | ICD-10-CM

## 2022-03-18 DIAGNOSIS — Z23 Encounter for immunization: Secondary | ICD-10-CM

## 2022-03-18 DIAGNOSIS — Z7409 Other reduced mobility: Secondary | ICD-10-CM

## 2022-03-18 MED ORDER — MISC. DEVICES MISC: 0 refills | Status: DC

## 2022-03-18 MED ORDER — OXYCODONE-ACETAMINOPHEN 7.5-325 MG PO TABS: 1.0000 | ORAL_TABLET | Freq: Four times a day (QID) | ORAL | 0 refills | Status: DC | PRN

## 2022-03-18 NOTE — Progress Notes (Signed)
?  Date of Visit: 03/18/2022  ? ?SUBJECTIVE:  ? ?HPI: ? ?[name] presents today for routine follow up. ? ?Mobility - has new power wheelchair. It has been a Higher education careers adviser for her in terms of quality of life - is able to be more mobile around her home without relying on others for simple things like getting a drink from the kitchen. ? ?Sleep trouble - needs new rx for ramelteon. Uses bipap at night with 2L. Only using oxygen at night now. ? ?Chronic pain - taking oxycodone-acetaminophen 7.5-'325mg'$  up to 4 times daily. Pain is much better controlled with this dose. Is working with physical therapy on being more active. Needs new rx for shoe inserts/toe filler for foot (s/p transmetatarsal amputation). Storing her medication safely. No problems controlling how often she takes the medication. ? ?OBJECTIVE:  ? ?BP (!) 143/84   Pulse (!) 103   Temp 97.9 ?F (36.6 ?C)   Wt (!) 344 lb 12.8 oz (156.4 kg)   LMP  (LMP Unknown)   SpO2 97%   BMI 63.06 kg/m?  ?Gen: no acute distress, pleasant cooperative, in power wheelchair ?HEENT: normocephalic, atraumatic  ?Lungs: normal work of breathing  ?Neuro: alert, speech normal grossly nonfocal ? ?ASSESSMENT/PLAN:  ? ?Health maintenance:  ?-bivalent COVID booster given today ? ?Chronic pain ?Overall stable. Remains under '50mg'$  morphine equivalents daily. No signs of misuse. Benefits outweigh risks, continue present rx. Also continue following up with pain management for adjunctive pain control modalities. Refilled x2 months. ? ?Impaired mobility ?Doing very well with new power wheelchair. I am so glad to see her more independent - this will really improve her quality of life.  ? ?Sleep trouble ?Refilled ramelteon. Continue with bipap at night with oxygen. ? ?FOLLOW UP: ?Follow up in 2 months for chronic pain ? ?[place]. [name], MD ?[facility] ?

## 2022-03-18 NOTE — Patient Instructions (Signed)
It was great to see you again today! ? ?Refilled pain medications for 2 months ? ?Also sent in more of the ramelteon to help with sleep. ? ?Follow up with me in 2 months, sooner if needed ? ?Be well, ?Dr. [name]  ?

## 2022-03-22 ENCOUNTER — Encounter: Payer: Self-pay | Admitting: Family Medicine

## 2022-03-22 ENCOUNTER — Ambulatory Visit: Payer: Medicaid Other | Admitting: Physical Therapy

## 2022-03-23 ENCOUNTER — Telehealth: Payer: Self-pay | Admitting: Family Medicine

## 2022-03-23 MED ORDER — RAMELTEON 8 MG PO TABS: 8.0000 mg | ORAL_TABLET | Freq: Every day | ORAL | 0 refills | Status: DC

## 2022-03-23 NOTE — Telephone Encounter (Signed)
Patient's mother dropped off FMLA paperwork to be completed. Last DOS was 03/18/22. Placed in [employer]. ?

## 2022-03-24 ENCOUNTER — Ambulatory Visit: Payer: Medicaid Other | Admitting: Physical Therapy

## 2022-03-24 NOTE — Telephone Encounter (Signed)
Reviewed form and placed in PCP's box for completion.  .Madyson Lukach R Gwyn Hieronymus, CMA  

## 2022-03-25 ENCOUNTER — Other Ambulatory Visit (HOSPITAL_COMMUNITY): Payer: Self-pay

## 2022-03-25 ENCOUNTER — Telehealth: Payer: Self-pay

## 2022-03-25 NOTE — Telephone Encounter (Signed)
A Prior Authorization (REAUTHORIZATION) was initiated for this patients RAMELTEON through [facility].  ? ?CONFIRMATION # [id number] W ? ?

## 2022-03-29 ENCOUNTER — Ambulatory Visit: Payer: Medicaid Other | Admitting: Physical Therapy

## 2022-03-29 NOTE — Telephone Encounter (Signed)
Prior Auth for patients medication RAMELTEON approved by MEDICAID from 03/25/22 to 09/21/22. ? ? ?

## 2022-03-30 MED ORDER — NYSTATIN 100000 UNIT/GM EX POWD
1.0000 | Freq: Three times a day (TID) | CUTANEOUS | 0 refills | Status: DC
Start: 2022-03-30 — End: 2023-03-07

## 2022-04-02 ENCOUNTER — Encounter: Payer: Medicaid Other | Admitting: Physical Therapy

## 2022-04-02 NOTE — Telephone Encounter (Signed)
Forms completed after speaking with patient's mother to clarify her needs ?Placed originals at front desk, copy in scan pile ? ?[name], MD  ?

## 2022-04-04 ENCOUNTER — Encounter: Payer: Self-pay | Admitting: Family Medicine

## 2022-04-05 ENCOUNTER — Encounter: Payer: Medicaid Other | Admitting: Physical Therapy

## 2022-04-05 DIAGNOSIS — B379 Candidiasis, unspecified: Secondary | ICD-10-CM | POA: Clinically undetermined

## 2022-04-06 ENCOUNTER — Encounter: Payer: Self-pay | Admitting: Family Medicine

## 2022-04-06 ENCOUNTER — Telehealth: Payer: Self-pay

## 2022-04-06 ENCOUNTER — Ambulatory Visit (INDEPENDENT_AMBULATORY_CARE_PROVIDER_SITE_OTHER): Payer: Medicaid Other | Admitting: Family Medicine

## 2022-04-06 ENCOUNTER — Telehealth: Payer: Self-pay | Admitting: Family Medicine

## 2022-04-06 VITALS — BP 116/77 | HR 102

## 2022-04-06 DIAGNOSIS — R8290 Unspecified abnormal findings in urine: Secondary | ICD-10-CM | POA: Diagnosis not present

## 2022-04-06 DIAGNOSIS — L89899 Pressure ulcer of other site, unspecified stage: Secondary | ICD-10-CM

## 2022-04-06 DIAGNOSIS — S91309A Unspecified open wound, unspecified foot, initial encounter: Secondary | ICD-10-CM

## 2022-04-06 LAB — POCT URINALYSIS DIP (MANUAL ENTRY)
Bilirubin, UA: NEGATIVE
Glucose, UA: >=1000 mg/dL — AB
Ketones, POC UA: NEGATIVE mg/dL
Leukocytes, UA: NEGATIVE
Nitrite, UA: NEGATIVE
Protein Ur, POC: 30 mg/dL — AB
Spec Grav, UA: 1.01 (ref 1.010–1.025)
Urobilinogen, UA: 0.2 E.U./dL
pH, UA: 5.5 (ref 5.0–8.0)

## 2022-04-06 LAB — POCT UA - MICROSCOPIC ONLY: WBC, Ur, HPF, POC: NONE SEEN (ref 0–5)

## 2022-04-06 NOTE — Telephone Encounter (Signed)
Patient returns my call in regards to [employer]. See mychart for more information.  ? ?Patient reports open wound on her heel and one on her leg. Patient denies any fevers or chills. Patient denies any purulent discharge.  ? ?Patient advised she needs to be evaluated.  ? ?Patient scheduled for this afternoon.  ?

## 2022-04-06 NOTE — Patient Instructions (Signed)
Today we discussed daily dressing changes for the right foot wound as well as the left leg wound.  If it starts to worsen within the next 2 weeks let us know.  You will need to see wound care for these if they do not improve.  The measurement for the right foot wound is 2 cm and the measurement for the left leg wound is 2.2 cm. ?Please keep your follow-up appointment with your infectious disease doctor. ?Today we also checked your urine I will notify you of results when available. ?

## 2022-04-06 NOTE — Telephone Encounter (Signed)
Patient dropped off form for theraputic shoes to be completed. Last DOS was 04/06/22. Placed in Red folder ?

## 2022-04-06 NOTE — Progress Notes (Signed)
? ? ?  SUBJECTIVE:  ? ?CHIEF COMPLAINT / HPI:  ? ?Right foot wound, Left leg wound ?Noticed 2 days ago that her right foot was draining blood and pus.  She was on antibiotics per [facility] infectious disease for her foot due to recent infection in her foot.  She was taking micafungin but has since stop due to stomach upset.  Her next appointment with infectious disease is 5/16.  She also stated she has notified them of this issue as well.  She denies any fever or chills.  She has been attempting to change out the dressings.  Denies odor and pain at this time. ?Her left leg is now starting to have a wound that is also draining.  It does not feel hot but is mildly painful.  She has only noticed pus coming from the site and not blood. ? ?Dark urine, abnormal odor ?Noticed over several days.  Denies dysuria or blood.  Denies fever and abdominal pain.  Would like for her urine to be checked today. ? ?PERTINENT  PMH / PSH: Chronic osteomyelitis, diabetic ulcer, history of transmetatarsal amputation, history of left BKA with prosthetic, wheelchair dependent ? ?OBJECTIVE:  ? ?BP 116/77   Pulse (!) 102   SpO2 91%   ?General: Appears well, no acute distress. Age appropriate. ?Respiratory: normal effort ?Extremities: Evidence of right transmetatarsal amputation, left BKA ?Skin: Right foot with medial 2 cm wound with some pus and undermining.  Bone unable to be palpated.  Left limb with 2 cm circumferential pressure ulcer with slough easily removed with minimal bleeding. ?Neuro: alert and oriented, no focal deficits ?Psych: normal affect ?  ?Media Information ?Document Information ? ?Photos  ?  ?04/06/2022 14:33  ?Attached To:  ?Office Visit on 04/06/22 with Autry-Lott, Naaman Plummer, DO  ? ?Source Information ? ?Autry-Lott, Vanice Sarah  [facility] Resident  ? ?  ?Media Information ?Document Information ? ?Photos  ?  ?04/06/2022 14:33  ?Attached To:  ?Office Visit on 04/06/22 with Autry-Lott, Naaman Plummer, DO  ? ?Source Information ? ?Autry-Lott,  [facility] Med Resident  ? ?ASSESSMENT/PLAN:  ? ?1. Wound of foot, history of transmetatarsal amputation and osteomyelitis ?Draining pus and blood over the last few days. Dr. McDiarmid did not find any undermining at this time and did not think the wound was infected. Will likely need daily dressing changes and eventually wound care.  Encouraged to keep follow-up with infectious disease doctor.  Right foot wound is about 2 cm encouraged to make follow-up appointment if other symptoms develop or wound increases in size. ? ?2. Pressure injury of skin of other site, unspecified injury stage ?Left BKA site with pressure ulcer likely from prosthetic leg.  Slough easily removed today with minimal bleeding.  Encouraged daily dressing changes and to be possibly fitted for new prosthetic with more cushion.  Encouraged to keep follow-up with ID as above Will eventually need wound care.  Patient plans to discussed wound care with [facility]. ? ?3. Abnormal urine odor ?No systemic symptoms.  Denies dysuria.  Recently on micafungin antibiotics for osteomyelitis.  Will follow urine results. ?- POCT UA - Microscopic Only ?- POCT urinalysis dipstick ? ? ? ?Burnell Hurta Autry-Lott, DO ?[facility]  ?

## 2022-04-07 ENCOUNTER — Encounter: Payer: Medicaid Other | Admitting: Physical Therapy

## 2022-04-07 NOTE — Telephone Encounter (Signed)
Reviewed form and placed in PCP's box for completion.  .Casi Westerfeld R Eveny Anastas, CMA  

## 2022-04-08 ENCOUNTER — Telehealth: Payer: Self-pay

## 2022-04-08 NOTE — Telephone Encounter (Signed)
Patient calls nurse line requesting next weeks pain medication to be dated for 5/11. ? ?Patient reports her pharmacy is closed on 5/13 and reports she already has a pended delivery for 5/11. ? ?Will forward to PCP.  ?

## 2022-04-08 NOTE — Telephone Encounter (Signed)
Patient scheduled for tomorrow afternoon for evaluation.  ? ?Discussed red flags with patient in the meantime.  ?

## 2022-04-09 ENCOUNTER — Ambulatory Visit (INDEPENDENT_AMBULATORY_CARE_PROVIDER_SITE_OTHER): Payer: Medicaid Other | Admitting: Family Medicine

## 2022-04-09 VITALS — BP 132/68 | HR 92 | Ht 62.0 in

## 2022-04-09 DIAGNOSIS — T148XXA Other injury of unspecified body region, initial encounter: Secondary | ICD-10-CM | POA: Diagnosis not present

## 2022-04-09 DIAGNOSIS — L089 Local infection of the skin and subcutaneous tissue, unspecified: Secondary | ICD-10-CM | POA: Diagnosis not present

## 2022-04-09 MED ORDER — DOXYCYCLINE HYCLATE 100 MG PO TABS: 100.0000 mg | ORAL_TABLET | Freq: Two times a day (BID) | ORAL | 0 refills | Status: AC

## 2022-04-09 NOTE — Progress Notes (Signed)
    SUBJECTIVE:   CHIEF COMPLAINT / HPI: wound problem   Wound problem Patient has been dealing with wound on her right foot for 2-3 weeks after the scab was removed by her aide  She states that over the last few days, the wound has begun to drain pus and blood  She denies having fever or chills but states that she usually does not have fever nor chills with infections  She follows with ID and has appt 04/20/22  She states that she usually does not have resolution of infections with oral abx and usually requires IV medications  The wound is only tender with probing  The area appears more red and warm to touch than when she was recently evaluated in our clinic   PERTINENT  PMH / PSH:  Morbid obesity  T2DM  Left BKA  Right partial foot amputation   OBJECTIVE:   BP 132/68   Pulse 92   Ht '5\' 2"'$  (1.575 m)   LMP 04/08/2022   SpO2 95%   BMI 63.06 kg/m   Physical Exam Cardiovascular:     Pulses:          Posterior tibial pulses are 2+ on the right side.  Musculoskeletal:     Right Lower Extremity: Right leg is amputated below ankle.     Left Lower Extremity: Left leg is amputated below knee.  Feet:     Right foot:     Skin integrity: Erythema and warmth present.     Media Information  ASSESSMENT/PLAN:   Wound infection Discussed with patient and family recommendation for oral abx until infectious disease follow up  Will start doxycyline '100mg'$  BID until 5/16 appt  Patient will plan to seek care at [facility] if wound worsens or systemic signs of infection begin      Eulis Foster, MD [place]

## 2022-04-09 NOTE — Patient Instructions (Signed)
We will prescribe doxycycline until 5/16 to help with the wound infection. ? ?Please monitor for development of pain, worsening redness streaking up the leg or increased warmness as you will need to seek care at [facility] for further management.  ? ? ?

## 2022-04-09 NOTE — Assessment & Plan Note (Signed)
Discussed with patient and family recommendation for oral abx until infectious disease follow up  ?Will start doxycyline '100mg'$  BID until 5/16 appt  ?Patient will plan to seek care at [facility] if wound worsens or systemic signs of infection begin  ? ?

## 2022-04-11 ENCOUNTER — Encounter (HOSPITAL_COMMUNITY): Payer: Self-pay

## 2022-04-11 ENCOUNTER — Other Ambulatory Visit: Payer: Self-pay

## 2022-04-11 ENCOUNTER — Inpatient Hospital Stay (HOSPITAL_COMMUNITY)
Admission: EM | Admit: 2022-04-11 | Discharge: 2022-04-14 | DRG: 291 | Disposition: A | Discharge status: Home / Self Care | Payer: Medicaid Other | Attending: Internal Medicine | Admitting: Internal Medicine

## 2022-04-11 ENCOUNTER — Emergency Department (HOSPITAL_COMMUNITY): Payer: Medicaid Other

## 2022-04-11 DIAGNOSIS — G894 Chronic pain syndrome: Secondary | ICD-10-CM | POA: Diagnosis present

## 2022-04-11 DIAGNOSIS — N1831 Chronic kidney disease, stage 3a: Secondary | ICD-10-CM | POA: Diagnosis present

## 2022-04-11 DIAGNOSIS — E11621 Type 2 diabetes mellitus with foot ulcer: Secondary | ICD-10-CM | POA: Diagnosis present

## 2022-04-11 DIAGNOSIS — E1165 Type 2 diabetes mellitus with hyperglycemia: Secondary | ICD-10-CM | POA: Diagnosis present

## 2022-04-11 DIAGNOSIS — J9621 Acute and chronic respiratory failure with hypoxia: Secondary | ICD-10-CM | POA: Diagnosis not present

## 2022-04-11 DIAGNOSIS — I130 Hypertensive heart and chronic kidney disease with heart failure and stage 1 through stage 4 chronic kidney disease, or unspecified chronic kidney disease: Principal | ICD-10-CM | POA: Diagnosis present

## 2022-04-11 DIAGNOSIS — K219 Gastro-esophageal reflux disease without esophagitis: Secondary | ICD-10-CM | POA: Diagnosis present

## 2022-04-11 DIAGNOSIS — J449 Chronic obstructive pulmonary disease, unspecified: Secondary | ICD-10-CM | POA: Diagnosis present

## 2022-04-11 DIAGNOSIS — Z79899 Other long term (current) drug therapy: Secondary | ICD-10-CM

## 2022-04-11 DIAGNOSIS — I10 Essential (primary) hypertension: Secondary | ICD-10-CM | POA: Diagnosis present

## 2022-04-11 DIAGNOSIS — E1122 Type 2 diabetes mellitus with diabetic chronic kidney disease: Secondary | ICD-10-CM | POA: Diagnosis present

## 2022-04-11 DIAGNOSIS — Z87892 Personal history of anaphylaxis: Secondary | ICD-10-CM

## 2022-04-11 DIAGNOSIS — Z6841 Body Mass Index (BMI) 40.0 and over, adult: Secondary | ICD-10-CM

## 2022-04-11 DIAGNOSIS — G2581 Restless legs syndrome: Secondary | ICD-10-CM | POA: Diagnosis present

## 2022-04-11 DIAGNOSIS — Z83438 Family history of other disorder of lipoprotein metabolism and other lipidemia: Secondary | ICD-10-CM

## 2022-04-11 DIAGNOSIS — D75839 Thrombocytosis, unspecified: Secondary | ICD-10-CM | POA: Diagnosis present

## 2022-04-11 DIAGNOSIS — E1140 Type 2 diabetes mellitus with diabetic neuropathy, unspecified: Secondary | ICD-10-CM

## 2022-04-11 DIAGNOSIS — Z8701 Personal history of pneumonia (recurrent): Secondary | ICD-10-CM

## 2022-04-11 DIAGNOSIS — J45909 Unspecified asthma, uncomplicated: Secondary | ICD-10-CM | POA: Diagnosis present

## 2022-04-11 DIAGNOSIS — Z87891 Personal history of nicotine dependence: Secondary | ICD-10-CM

## 2022-04-11 DIAGNOSIS — Z89512 Acquired absence of left leg below knee: Secondary | ICD-10-CM

## 2022-04-11 DIAGNOSIS — E119 Type 2 diabetes mellitus without complications: Secondary | ICD-10-CM

## 2022-04-11 DIAGNOSIS — I5030 Unspecified diastolic (congestive) heart failure: Secondary | ICD-10-CM | POA: Diagnosis present

## 2022-04-11 DIAGNOSIS — F3181 Bipolar II disorder: Secondary | ICD-10-CM | POA: Diagnosis present

## 2022-04-11 DIAGNOSIS — T508X5A Adverse effect of diagnostic agents, initial encounter: Secondary | ICD-10-CM | POA: Diagnosis present

## 2022-04-11 DIAGNOSIS — J9601 Acute respiratory failure with hypoxia: Secondary | ICD-10-CM | POA: Diagnosis present

## 2022-04-11 DIAGNOSIS — E1142 Type 2 diabetes mellitus with diabetic polyneuropathy: Secondary | ICD-10-CM | POA: Diagnosis present

## 2022-04-11 DIAGNOSIS — R0600 Dyspnea, unspecified: Principal | ICD-10-CM

## 2022-04-11 DIAGNOSIS — E871 Hypo-osmolality and hyponatremia: Secondary | ICD-10-CM | POA: Diagnosis present

## 2022-04-11 DIAGNOSIS — D72829 Elevated white blood cell count, unspecified: Secondary | ICD-10-CM | POA: Diagnosis present

## 2022-04-11 DIAGNOSIS — E662 Morbid (severe) obesity with alveolar hypoventilation: Secondary | ICD-10-CM | POA: Diagnosis present

## 2022-04-11 DIAGNOSIS — E785 Hyperlipidemia, unspecified: Secondary | ICD-10-CM | POA: Diagnosis present

## 2022-04-11 DIAGNOSIS — Z794 Long term (current) use of insulin: Secondary | ICD-10-CM

## 2022-04-11 DIAGNOSIS — Z833 Family history of diabetes mellitus: Secondary | ICD-10-CM

## 2022-04-11 DIAGNOSIS — Z8249 Family history of ischemic heart disease and other diseases of the circulatory system: Secondary | ICD-10-CM

## 2022-04-11 DIAGNOSIS — N179 Acute kidney failure, unspecified: Secondary | ICD-10-CM | POA: Diagnosis present

## 2022-04-11 DIAGNOSIS — G4733 Obstructive sleep apnea (adult) (pediatric): Secondary | ICD-10-CM | POA: Diagnosis present

## 2022-04-11 DIAGNOSIS — I5033 Acute on chronic diastolic (congestive) heart failure: Secondary | ICD-10-CM | POA: Diagnosis present

## 2022-04-11 HISTORY — DX: Acute and chronic respiratory failure with hypoxia: J96.21

## 2022-04-11 HISTORY — DX: Thrombocytosis, unspecified: D75.839

## 2022-04-11 LAB — HEPATIC FUNCTION PANEL
ALT: 19 U/L (ref 0–44)
AST: 19 U/L (ref 15–41)
Albumin: 3 g/dL — ABNORMAL LOW (ref 3.5–5.0)
Alkaline Phosphatase: 164 U/L — ABNORMAL HIGH (ref 38–126)
Bilirubin, Direct: <0.1 mg/dL (ref 0.0–0.2)
Total Bilirubin: 0.4 mg/dL (ref 0.3–1.2)
Total Protein: 8 g/dL (ref 6.5–8.1)

## 2022-04-11 LAB — CBC WITH DIFFERENTIAL/PLATELET
Abs Immature Granulocytes: 0.13 10*3/uL — ABNORMAL HIGH (ref 0.00–0.07)
Basophils Absolute: 0.1 10*3/uL (ref 0.0–0.1)
Basophils Relative: 1 %
Eosinophils Absolute: 0.3 10*3/uL (ref 0.0–0.5)
Eosinophils Relative: 3 %
HCT: 37.9 % (ref 36.0–46.0)
Hemoglobin: 12.5 g/dL (ref 12.0–15.0)
Immature Granulocytes: 1 %
Lymphocytes Relative: 39 %
Lymphs Abs: 4.6 10*3/uL — ABNORMAL HIGH (ref 0.7–4.0)
MCH: 29.8 pg (ref 26.0–34.0)
MCHC: 33 g/dL (ref 30.0–36.0)
MCV: 90.5 fL (ref 80.0–100.0)
Monocytes Absolute: 0.7 10*3/uL (ref 0.1–1.0)
Monocytes Relative: 6 %
Neutro Abs: 6 10*3/uL (ref 1.7–7.7)
Neutrophils Relative %: 50 %
Platelets: 458 10*3/uL — ABNORMAL HIGH (ref 150–400)
RBC: 4.19 MIL/uL (ref 3.87–5.11)
RDW: 13.2 % (ref 11.5–15.5)
WBC: 11.9 10*3/uL — ABNORMAL HIGH (ref 4.0–10.5)
nRBC: 0 % (ref 0.0–0.2)

## 2022-04-11 LAB — BLOOD GAS, VENOUS
Acid-Base Excess: 8 mmol/L — ABNORMAL HIGH (ref 0.0–2.0)
Bicarbonate: 34.7 mmol/L — ABNORMAL HIGH (ref 20.0–28.0)
O2 Saturation: 91 %
Patient temperature: 37
pCO2, Ven: 56 mmHg (ref 44–60)
pH, Ven: 7.4 (ref 7.25–7.43)
pO2, Ven: 58 mmHg — ABNORMAL HIGH (ref 32–45)

## 2022-04-11 LAB — BASIC METABOLIC PANEL
Anion gap: 11 (ref 5–15)
BUN: 61 mg/dL — ABNORMAL HIGH (ref 6–20)
CO2: 31 mmol/L (ref 22–32)
Calcium: 8.8 mg/dL — ABNORMAL LOW (ref 8.9–10.3)
Chloride: 93 mmol/L — ABNORMAL LOW (ref 98–111)
Creatinine, Ser: 1.31 mg/dL — ABNORMAL HIGH (ref 0.44–1.00)
GFR, Estimated: 52 mL/min — ABNORMAL LOW (ref >60)
Glucose, Bld: 362 mg/dL — ABNORMAL HIGH (ref 70–99)
Potassium: 4.1 mmol/L (ref 3.5–5.1)
Sodium: 135 mmol/L (ref 135–145)

## 2022-04-11 LAB — TROPONIN I (HIGH SENSITIVITY)
Troponin I (High Sensitivity): 22 ng/L — ABNORMAL HIGH (ref <18)
Troponin I (High Sensitivity): 21 ng/L — ABNORMAL HIGH (ref <18)

## 2022-04-11 LAB — GLUCOSE, CAPILLARY: Glucose-Capillary: 357 mg/dL — ABNORMAL HIGH (ref 70–99)

## 2022-04-11 LAB — CBG MONITORING, ED: Glucose-Capillary: 246 mg/dL — ABNORMAL HIGH (ref 70–99)

## 2022-04-11 LAB — BRAIN NATRIURETIC PEPTIDE: B Natriuretic Peptide: 30.1 pg/mL (ref 0.0–100.0)

## 2022-04-11 MED ORDER — METOCLOPRAMIDE HCL 5 MG/ML IJ SOLN
5.0000 mg | Freq: Once | INTRAMUSCULAR | Status: AC
  Administered 2022-04-11: 5 mg via INTRAVENOUS
  Filled 2022-04-11: qty 2

## 2022-04-11 MED ORDER — INSULIN DEGLUDEC 100 UNIT/ML ~~LOC~~ SOPN: 30.0000 [IU] | PEN_INJECTOR | Freq: Two times a day (BID) | SUBCUTANEOUS | Status: DC

## 2022-04-11 MED ORDER — DULOXETINE HCL 60 MG PO CPEP
60.0000 mg | ORAL_CAPSULE | Freq: Every day | ORAL | Status: DC
  Administered 2022-04-12 – 2022-04-14 (×3): 60 mg via ORAL
  Filled 2022-04-11 (×3): qty 1

## 2022-04-11 MED ORDER — PREDNISOLONE ACETATE 1 % OP SUSP
1.0000 [drp] | Freq: Four times a day (QID) | OPHTHALMIC | Status: DC
  Administered 2022-04-11 – 2022-04-14 (×12): 1 [drp] via OPHTHALMIC
  Filled 2022-04-11: qty 5

## 2022-04-11 MED ORDER — ENOXAPARIN SODIUM 40 MG/0.4ML IJ SOSY: 40.0000 mg | PREFILLED_SYRINGE | INTRAMUSCULAR | Status: DC

## 2022-04-11 MED ORDER — DOXYCYCLINE HYCLATE 100 MG PO TABS
100.0000 mg | ORAL_TABLET | Freq: Two times a day (BID) | ORAL | Status: DC
  Administered 2022-04-11 – 2022-04-14 (×6): 100 mg via ORAL
  Filled 2022-04-11 (×6): qty 1

## 2022-04-11 MED ORDER — SODIUM CHLORIDE (PF) 0.9 % IJ SOLN
INTRAMUSCULAR | Status: AC
  Filled 2022-04-11: qty 50

## 2022-04-11 MED ORDER — GABAPENTIN 300 MG PO CAPS
1200.0000 mg | ORAL_CAPSULE | Freq: Three times a day (TID) | ORAL | Status: DC
  Administered 2022-04-11 – 2022-04-14 (×10): 1200 mg via ORAL
  Filled 2022-04-11 (×11): qty 4

## 2022-04-11 MED ORDER — OXYCODONE-ACETAMINOPHEN 5-325 MG PO TABS
2.0000 | ORAL_TABLET | Freq: Once | ORAL | Status: AC
  Administered 2022-04-11: 2 via ORAL
  Filled 2022-04-11: qty 2

## 2022-04-11 MED ORDER — ONDANSETRON HCL 4 MG PO TABS
8.0000 mg | ORAL_TABLET | Freq: Four times a day (QID) | ORAL | Status: DC | PRN
  Administered 2022-04-11: 8 mg via ORAL
  Filled 2022-04-11: qty 2

## 2022-04-11 MED ORDER — ACETAMINOPHEN 325 MG PO TABS
650.0000 mg | ORAL_TABLET | Freq: Four times a day (QID) | ORAL | Status: DC | PRN
Start: 2022-04-11 — End: 2022-04-14

## 2022-04-11 MED ORDER — LORATADINE 10 MG PO TABS
10.0000 mg | ORAL_TABLET | Freq: Every day | ORAL | Status: DC
  Filled 2022-04-11 (×2): qty 1

## 2022-04-11 MED ORDER — INSULIN ASPART 100 UNIT/ML IJ SOLN
0.0000 [IU] | Freq: Three times a day (TID) | INTRAMUSCULAR | Status: DC
  Administered 2022-04-11: 11 [IU] via SUBCUTANEOUS
  Administered 2022-04-11: 7 [IU] via SUBCUTANEOUS
  Administered 2022-04-12: 20 [IU] via SUBCUTANEOUS
  Administered 2022-04-12: 15 [IU] via SUBCUTANEOUS
  Administered 2022-04-12: 20 [IU] via SUBCUTANEOUS
  Administered 2022-04-13: 15 [IU] via SUBCUTANEOUS
  Administered 2022-04-13: 20 [IU] via SUBCUTANEOUS
  Administered 2022-04-13 – 2022-04-14 (×2): 15 [IU] via SUBCUTANEOUS
  Administered 2022-04-14: 7 [IU] via SUBCUTANEOUS
  Filled 2022-04-11: qty 0.2

## 2022-04-11 MED ORDER — PREDNISOLONE ACETATE 1 % OP SUSP: 1.0000 [drp] | Freq: Four times a day (QID) | OPHTHALMIC | Status: DC

## 2022-04-11 MED ORDER — ALBUTEROL SULFATE (2.5 MG/3ML) 0.083% IN NEBU
2.5000 mg | INHALATION_SOLUTION | RESPIRATORY_TRACT | Status: DC | PRN
Start: 2022-04-11 — End: 2022-04-14

## 2022-04-11 MED ORDER — ASPIRIN EC 81 MG PO TBEC
81.0000 mg | DELAYED_RELEASE_TABLET | Freq: Every day | ORAL | Status: DC
  Administered 2022-04-12 – 2022-04-14 (×3): 81 mg via ORAL
  Filled 2022-04-11 (×3): qty 1

## 2022-04-11 MED ORDER — OXYCODONE-ACETAMINOPHEN 7.5-325 MG PO TABS
1.0000 | ORAL_TABLET | Freq: Four times a day (QID) | ORAL | Status: DC | PRN
  Administered 2022-04-11 – 2022-04-14 (×5): 1 via ORAL
  Filled 2022-04-11 (×5): qty 1

## 2022-04-11 MED ORDER — IOHEXOL 350 MG/ML SOLN
80.0000 mL | Freq: Once | INTRAVENOUS | Status: AC | PRN
  Administered 2022-04-11: 80 mL via INTRAVENOUS

## 2022-04-11 MED ORDER — FUROSEMIDE 10 MG/ML IJ SOLN
160.0000 mg | Freq: Once | INTRAVENOUS | Status: AC
  Administered 2022-04-11: 160 mg via INTRAVENOUS
  Filled 2022-04-11: qty 10

## 2022-04-11 MED ORDER — ENOXAPARIN SODIUM 80 MG/0.8ML IJ SOSY
80.0000 mg | PREFILLED_SYRINGE | INTRAMUSCULAR | Status: DC
  Administered 2022-04-11 – 2022-04-13 (×3): 80 mg via SUBCUTANEOUS
  Filled 2022-04-11 (×4): qty 0.8

## 2022-04-11 MED ORDER — POTASSIUM CHLORIDE CRYS ER 20 MEQ PO TBCR
20.0000 meq | EXTENDED_RELEASE_TABLET | Freq: Every day | ORAL | Status: DC
  Filled 2022-04-11: qty 1

## 2022-04-11 MED ORDER — ACETAMINOPHEN 650 MG RE SUPP: 650.0000 mg | Freq: Four times a day (QID) | RECTAL | Status: DC | PRN

## 2022-04-11 MED ORDER — INSULIN ASPART 100 UNIT/ML IJ SOLN
5.0000 [IU] | Freq: Once | INTRAMUSCULAR | Status: AC
  Administered 2022-04-11: 5 [IU] via SUBCUTANEOUS

## 2022-04-11 MED ORDER — SODIUM CHLORIDE 0.9 % IV SOLN
8.0000 mg | Freq: Four times a day (QID) | INTRAVENOUS | Status: DC | PRN
  Filled 2022-04-11: qty 4

## 2022-04-11 MED ORDER — ONDANSETRON HCL 4 MG/2ML IJ SOLN
4.0000 mg | Freq: Once | INTRAMUSCULAR | Status: AC
  Administered 2022-04-11: 4 mg via INTRAVENOUS
  Filled 2022-04-11: qty 2

## 2022-04-11 MED ORDER — INSULIN GLARGINE-YFGN 100 UNIT/ML ~~LOC~~ SOLN
30.0000 [IU] | Freq: Two times a day (BID) | SUBCUTANEOUS | Status: DC
  Administered 2022-04-11 – 2022-04-12 (×2): 30 [IU] via SUBCUTANEOUS
  Filled 2022-04-11 (×5): qty 0.3

## 2022-04-11 NOTE — ED Provider Notes (Signed)
?[place] DEPT ?Provider Note ? ? ?CSN: [id number] ?Arrival date & time: 04/11/22  9735 ? ?  ? ?History ? ?Chief Complaint  ?Patient presents with  ? Shortness of Breath  ? ? ?[name] is a 46 y.o. female. ? ?The history is provided by the patient and the EMS personnel.  ?Shortness of Breath ?She has history of hypertension, diabetes, hyperlipidemia, COPD, diastolic heart failure and comes in complaining of shortness of breath for the last 3 days which is getting worse.  This is similar to what she has had in the past with heart failure exacerbations.  She denies cough, fever, chills and she denies any chest pain, heaviness, tightness, pressure.  She takes torsemide 80 mg daily, but does not weigh herself.  EMS noted initial oxygen saturation of 88% while she was getting oxygen at 2 L/min via nasal cannula.  They increased the oxygen to 4 L/min with improved oxygen saturation.  She was given a dose of aspirin. ?  ?Home Medications ?Prior to Admission medications   ?Medication Sig Start Date End Date Taking? Authorizing Provider  ?Accu-Chek FastClix Lancets MISC USE DAILY AS INSTRUCTED 05/18/21   [name], MD  ?ACCU-CHEK GUIDE test strip USE T0 CHECK BLOOD SUGAR 3 TIMES DAILY 11/05/21   [name], MD  ?Accu-Chek Softclix Lancets lancets Use to check blood glucose four times daily 04/17/20   Martyn Malay, MD  ?albuterol (VENTOLIN HFA) 108 (90 Base) MCG/ACT inhaler INHALE 1 TO 2 PUFFS BY MOUTH EVERY SIX HOURS AS NEEDED FOR WHEEZING OR SHORTNESS OF BREATH ?Patient taking differently: Inhale 1 puff into the lungs every 6 (six) hours as needed for shortness of breath. 07/16/21   [name], MD  ?aspirin EC 81 MG tablet Take 1 tablet (81 mg total) by mouth daily. Swallow whole. 11/19/20   Sherren Mocha, MD  ?cetirizine (ZYRTEC) 10 MG tablet Take 1 tablet (10 mg total) by mouth daily as needed for allergies. 02/11/22   [name], MD   ?Cholecalciferol 1000 units tablet Take 1 tablet (1,000 Units total) by mouth daily. 04/05/17   [name], MD  ?Continuous Blood Gluc Sensor (DEXCOM G6 SENSOR) MISC Inject 1 applicator into the skin as directed. Change sensor every 10 days. 11/05/21   [name], MD  ?Continuous Blood Gluc Transmit (DEXCOM G6 TRANSMITTER) MISC Inject 1 Device into the skin as directed. Reuse 8 times with sensor changes. 09/03/21   [name], MD  ?doxycycline (VIBRA-TABS) 100 MG tablet Take 1 tablet (100 mg total) by mouth 2 (two) times daily for 12 days. 04/09/22 04/21/22  Simmons-Robinson, Riki Sheer, MD  ?DULoxetine (CYMBALTA) 30 MG capsule Take 1 capsule (30 mg total) by mouth daily. X 1 week; then 60 mg daily- for knee and back/nerve pain 02/15/22   Lovorn, Jinny Blossom, MD  ?gabapentin (NEURONTIN) 600 MG tablet Take 2 tablets (1,200 mg total) by mouth 3 (three) times daily. 02/11/22   [name], MD  ?Glucagon (BAQSIMI ONE PACK) 3 MG/DOSE POWD Place 3 mg into the nose as needed (low blood sugar).    [provider]  ?Glucagon, rDNA, (GLUCAGON EMERGENCY) 1 MG KIT Inject 1 mg into the muscle as needed for low blood sugar. 11/03/21   [provider]  ?GNP ULTICARE PEN NEEDLES 32G X 4 MM MISC USE TO inject insulin 2 TIMES DAILY 10/02/21   [name], MD  ?insulin aspart (NOVOLOG FLEXPEN) 100 UNIT/ML FlexPen Inject 20 Units  into the skin 3 (three) times daily with meals. ?Patient taking differently: Inject 30 Units into the skin 3 (three) times daily with meals. Sliding scale 10/25/21   Lyndee Hensen, DO  ?insulin degludec (TRESIBA FLEXTOUCH) 200 UNIT/ML FlexTouch Pen Inject 46 Units into the skin 2 (two) times daily. ?Patient taking differently: Inject 30 Units into the skin 2 (two) times daily. 10/25/21   Lyndee Hensen, DO  ?micafungin in sodium chloride 0.9 % 100 mL Inject into the vein daily. Per ID at [facility]    [provider]  ?[place]. Devices MISC Inserts and toe  filler for R foot 03/18/22   [name], MD  ?nystatin (MYCOSTATIN/NYSTOP) powder Apply 1 application. topically 3 (three) times daily. 03/30/22   [name], MD  ?ondansetron (ZOFRAN) 4 MG tablet Take 1 tablet (4 mg total) by mouth every 8 (eight) hours as needed for nausea or vomiting. 02/17/22   Lovorn, Jinny Blossom, MD  ?oxyCODONE-acetaminophen (PERCOCET) 7.5-325 MG tablet Take 1 tablet by mouth every 6 (six) hours as needed for severe pain. 03/18/22   [name], MD  ?potassium chloride SA (KLOR-CON M20) 20 MEQ tablet Take 1 tablet (20 mEq total) by mouth daily. 09/09/20   Richardson Dopp T, PA-C  ?ramelteon (ROZEREM) 8 MG tablet Take 1 tablet (8 mg total) by mouth at bedtime. 03/23/22   [name], MD  ?Dellis Anes 160-4.5 MCG/ACT inhaler inhale 2 PUFFS into THE lungs 2 TIMES DAILY 01/04/22   [name], MD  ?Tiotropium Bromide Monohydrate (SPIRIVA RESPIMAT) 1.25 MCG/ACT AERS Inhale 2 puffs into the lungs daily. 11/14/20   Martyn Ehrich, NP  ?torsemide (DEMADEX) 20 MG tablet Take 4 tablets (80 mg total) by mouth daily. 03/22/22   [name], MD  ?TRULICITY 3 UX/3.2TF SOPN Inject 3 mg into the skin once a week. Sundays 01/04/22   [provider]  ?   ? ?Allergies    ?Cefepime, Kiwi extract, Morphine and related, Nalbuphine, Trental [pentoxifylline], Toradol [ketorolac tromethamine], Morphine, and Nubain [nalbuphine hcl]   ? ?Review of Systems   ?Review of Systems  ?Respiratory:  Positive for shortness of breath.   ?All other systems reviewed and are negative. ? ?Physical Exam ?Updated Vital Signs ?BP 120/86   Pulse 93   Temp 97.7 ?F (36.5 ?C) (Oral)   Resp (!) 26   Ht '5\' 2"'  (1.575 m)   Wt (!) 155.6 kg   LMP 04/08/2022   SpO2 95%   BMI 62.74 kg/m?  ?Physical Exam ?Vitals and nursing note reviewed.  ?Morbidly obese, cushingoid appearing, 46 year old female, resting comfortably and in no acute distress. Vital signs are significant for elevated respiratory  rate. Oxygen saturation is 95%, which is normal. ?Head is normocephalic and atraumatic. PERRLA, EOMI. Oropharynx is clear. ?Neck is nontender and supple without adenopathy or JVD. ?Back is nontender and there is no CVA tenderness. ?Lungs are clear without rales, wheezes, or rhonchi. ?Chest is nontender. ?Heart has regular rate and rhythm without murmur. ?Abdomen is soft, flat, nontender without masses or hepatosplenomegaly and peristalsis is normoactive. ?Extremities: Left above-the-knee amputation.  Trace pretibial edema on the right. ?Skin is warm and dry without rash. ?Neurologic: Mental status is normal, cranial nerves are intact, moves all extremities equally. ? ?ED Results / Procedures / Treatments   ?Labs ?(all labs ordered are listed, but only abnormal results are displayed) ?Labs Reviewed  ?BASIC METABOLIC PANEL - Abnormal; Notable for the following components:  ?    Result  Value  ? Chloride 93 (*)   ? Glucose, Bld 362 (*)   ? BUN 61 (*)   ? Creatinine, Ser 1.31 (*)   ? Calcium 8.8 (*)   ? GFR, Estimated 52 (*)   ? All other components within normal limits  ?CBC WITH DIFFERENTIAL/PLATELET - Abnormal; Notable for the following components:  ? WBC 11.9 (*)   ? Platelets 458 (*)   ? Lymphs Abs 4.6 (*)   ? Abs Immature Granulocytes 0.13 (*)   ? All other components within normal limits  ?TROPONIN I (HIGH SENSITIVITY) - Abnormal; Notable for the following components:  ? Troponin I (High Sensitivity) 22 (*)   ? All other components within normal limits  ?BRAIN NATRIURETIC PEPTIDE  ?BLOOD GAS, ARTERIAL  ? ? ?EKG ?EKG Interpretation ? ?Date/Time:  Sunday Apr 11 2022 05:28:33 EDT ?Ventricular Rate:  94 ?PR Interval:  156 ?QRS Duration: 102 ?QT Interval:  400 ?QTC Calculation: 501 ?R Axis:   107 ?Text Interpretation: Sinus rhythm Right axis deviation Low voltage, extremity leads Abnormal R-wave progression, late transition ST elev, probable normal early repol pattern Borderline prolonged QT interval When compared with  ECG of 02/05/2022, Rightward axis is now present Confirmed by Delora Fuel ([id number]) on 04/11/2022 6:44:57 AM ? ?Radiology ?DG Chest Port 1 View ? ?Result Date: 04/11/2022 ?CLINICAL DATA:  46 year old female with history of shortness

## 2022-04-11 NOTE — ED Triage Notes (Addendum)
Pt from home via EMS c/o SOB the past few days. Pt denies cough, cold-like symptoms, fever/chills, swelling. Pt states that she had a sharp pain in the L side of her chest on the ambulance, but states that it went away. Pt was given '324mg'$  ASA per EMS. Pt's SpO2 was around 88-90% upon EMS arrival, and was placed on 4L O2 via nasal cannula. Pt on 2L O2 upon arrival. Pt wears BiPAP at night and O2 via nasal cannula PRN at home. Pt A&Ox4, VSS. Pt has infection to L foot that she is seeing ID and wound care for at [facility]. ?

## 2022-04-11 NOTE — H&P (Signed)
?History and Physical  ? ? ?Patient: [name] ZSW:[id number] DOB: [birth year] ?DOA: 04/11/2022 ?DOS: the patient was seen and examined on 04/11/2022 ?PCP: [name], MD  ?Patient coming from: Home ? ?Chief Complaint:  ?Chief Complaint  ?Patient presents with  ? Shortness of Breath  ? ?HPI: [name] is a 46 y.o. female with medical history significant of left foot osteomyelitis, left BKA, OSA, obesity hypoventilation, asthma/COPD, bipolar 2 disorder, right carpal tunnel syndrome, chronic pain syndrome, costochondritis, depression, type II DM, GERD, hyperlipidemia, GERD, hypertension, morbid obesity, peripheral neuropathy, history of pneumonia, rectal fissure, restless leg syndrome, history of SVT, syncopal episode, urinary frequency, urinary incontinence, varicose veins who was brought to the emergency department via EMS due to progressively worsening dyspnea associated with fluid retention and orthopnea for several days. EMS reported that initial O2 sat duration was 88 to 90% on 2 LPM and they increased to 4 LPM.  She also had a sharp left-sided, sharp, nonradiated chest pain while she was riding in the ambulance, but stated it only lasted 2 to 3 minutes.  She reports diaphoresis was ongoing before the chest pain, but no nausea, emesis, palpitations or lightheadedness.  No fever, sore throat, productive cough, wheezing or hemoptysis.  No abdominal pain, diarrhea, constipation, melena or hematochezia.  No flank pain, dysuria, frequency or hematuria. ? ?ED course: Initial vital signs were temperature 97.7 ?F, pulse 93, respirations 26, BP 120/86 mmHg and O2 sat 95% on room air.  Patient received furosemide 160 mg IVPB, ondansetron 4 mg IVP and 1 tablet of Percocet 5/325 mg. ? ?Lab work: CBC showed a white count 11.9, hemoglobin 12.5 g/dL platelets 458.  BNP 30.1 pg/mL.  Troponin was 22 and then 21 ng/L.  Venous blood gas showed a increased PO2 of 58 mmHg, bicarbonate of 34.7 and acid-base excess of 8.0  mmol/L.  The rest of the VBG was normal.  BMP showed normal sodium, potassium and CO2.  Chloride was 93 mmol/L.  Glucose [name] 62, BUN 61, creatinine 1.31 and calcium 8.8 mg/dL. ? ?Imaging: Portable 1 view chest radiograph showed low lung volumes without radiographic evidence of acute pulmonary disease.  CTA chest with aortic atherosclerosis but no evidence of PE. ?  ?Review of Systems: As mentioned in the history of present illness. All other systems reviewed and are negative. ?Past Medical History:  ?Diagnosis Date  ? Acute osteomyelitis of ankle or foot, left ([place]) 01/31/2018  ? Alveolar hypoventilation   ? Anemia   ? not on iron pill  ? Asthma   ? Bipolar 2 disorder ([place])   ? Carpal tunnel syndrome on right   ? recurrent  ? Cellulitis 08/2010-08/2011  ? Chronic pain   ? COPD (chronic obstructive pulmonary disease) ([place])   ? Symbicort daily and Proventil as needed  ? Costochondritis   ? Depression   ? Diabetes mellitus type II, uncontrolled 2000  ? Type 2, Uncontrolled.Takes Lantus daily.Fasting blood sugar runs 150  ? Drug-seeking behavior   ? GERD (gastroesophageal reflux disease)   ? takes Pantoprazole and Zantac daily  ? HLD (hyperlipidemia)   ? takes Atorvastatin daily  ? Hypertension   ? takes Lisinopril and Coreg daily  ? Morbid obesity ([place])   ? Nocturia   ? OSA on CPAP   ? Peripheral neuropathy   ? takes Gabapentin daily  ? Pneumonia   ? "walking" several yrs ago and as a baby (12/05/2018)  ? Rectal fissure   ? Restless leg   ? SVT (  supraventricular tachycardia) ([place])   ? Syncope 02/25/2016  ? Urinary frequency   ? Urinary incontinence 10/23/2020  ? Varicose veins   ? Right medial thigh and Left leg   ? ?Past Surgical History:  ?Procedure Laterality Date  ? AMPUTATION Left 02/01/2018  ? Procedure: LEFT FOURTH AND 5TH TOE RAY AMPUTATION;  Surgeon: [name], MD;  Location: [facility];  Service: Orthopedics;  Laterality: Left;  ? AMPUTATION Left 03/03/2018  ? Procedure: LEFT BELOW KNEE AMPUTATION;  Surgeon:  [name], MD;  Location: [facility];  Service: Orthopedics;  Laterality: Left;  ? CARPAL TUNNEL RELEASE Bilateral   ? CESAREAN SECTION  2007  ? CORONARY ANGIOGRAPHY N/A 11/26/2020  ? Procedure: CORONARY ANGIOGRAPHY;  Surgeon: Sherren Mocha, MD;  Location: [facility];  Service: Cardiovascular;  Laterality: N/A;  ? FOOT AMPUTATION Right   ? INCISION AND DRAINAGE ABSCESS Left 10/16/2021  ? Procedure: INCISION AND DRAINAGE ABSCESS;  Surgeon: Dwan Bolt, MD;  Location: [facility];  Service: General;  Laterality: Left;  ? INCISION AND DRAINAGE PERIRECTAL ABSCESS Left 05/18/2019  ? Procedure: IRRIGATION AND DEBRIDEMENT OF PANNIS ABSCESS, POSSIBLE DEBRIDEMENT OF BUTTOCK WOUND;  Surgeon: Donnie Mesa, MD;  Location: [facility];  Service: General;  Laterality: Left;  ? IRRIGATION AND DEBRIDEMENT BUTTOCKS Left 05/17/2019  ? Procedure: IRRIGATION AND DEBRIDEMENT BUTTOCKS;  Surgeon: Donnie Mesa, MD;  Location: [facility];  Service: General;  Laterality: Left;  ? KNEE ARTHROSCOPY Right 07/17/2010  ? LEFT HEART CATHETERIZATION WITH CORONARY ANGIOGRAM N/A 07/27/2012  ? Procedure: LEFT HEART CATHETERIZATION WITH CORONARY ANGIOGRAM;  Surgeon: Sherren Mocha, MD;  Location: [facility];  Service: Cardiovascular;  Laterality: N/A;  ? MASS EXCISION N/A 06/29/2013  ? Procedure:  WIDE LOCAL EXCISION OF POSTERIOR NECK ABSCESS;  Surgeon: Ralene Ok, MD;  Location: [facility];  Service: General;  Laterality: N/A;  ? REPAIR KNEE LIGAMENT Left   ? "fixed ligaments and chipped patella"  ? right transmetatarsal amputation     ? ?Social History:  reports that she quit smoking about 16 years ago. Her smoking use included cigarettes. She has a 3.75 pack-year smoking history. She has never used smokeless tobacco. She reports that she does not currently use drugs. She reports that she does not drink alcohol. ? ?Allergies  ?Allergen Reactions  ? Cefepime Other (See Comments)  ?  AKI, see records from [facility] hospitalization in January 2020.    ?Other reaction(s): Other (See Comments) ?"Shut down organs/kidneys" ? ?Note pt has tolerated Rocephin and Keflex  ? Kiwi Extract Shortness Of Breath, Swelling and Anaphylaxis  ? Morphine And Related Nausea And Vomiting  ? Nalbuphine   ?  Other reaction(s): Hallucinations, Other (See Comments) ?"FEELS LIKE SOMETHING CRAWLING ON ME" ?Reaction:  Nervousness ?"FEELS LIKE SOMETHING CRAWLING ON ME" ?Reaction:  Nervousness ?Reaction:  Nervousness ?"FEELS LIKE SOMETHING CRAWLING ON ME" ?  ? Trental [Pentoxifylline] Nausea And Vomiting  ? Toradol [Ketorolac Tromethamine] Other (See Comments)  ?  Feels like something is crawling on me  ? Morphine Nausea And Vomiting  ? Nubain [Nalbuphine Hcl] Other (See Comments)  ?  "FEELS LIKE SOMETHING CRAWLING ON ME"  ? ? ?Family History  ?Problem Relation Age of Onset  ? Diabetes Mother   ? Hyperlipidemia Mother   ? Depression Mother   ? GER disease Mother   ? Allergic rhinitis Mother   ? Restless legs syndrome Mother   ? Heart attack Paternal Uncle   ? Heart disease Paternal Grandmother   ?  Heart attack Paternal Grandmother   ? Heart attack Paternal Grandfather   ? Heart disease Paternal Grandfather   ? Heart attack Father   ? Migraines Sister   ? Cancer Maternal Grandmother   ?     COLON  ? Hypertension Maternal Grandmother   ? Hyperlipidemia Maternal Grandmother   ? Diabetes Maternal Grandmother   ? Other Maternal Grandfather   ?     GUN SHOT  ? Anxiety disorder Sister   ? Asthma Child   ? ? ?Prior to Admission medications   ?Medication Sig Start Date End Date Taking? Authorizing Provider  ?Accu-Chek FastClix Lancets MISC USE DAILY AS INSTRUCTED 05/18/21   [name], MD  ?ACCU-CHEK GUIDE test strip USE T0 CHECK BLOOD SUGAR 3 TIMES DAILY 11/05/21   [name], MD  ?Accu-Chek Softclix Lancets lancets Use to check blood glucose four times daily 04/17/20   Martyn Malay, MD  ?albuterol (VENTOLIN HFA) 108 (90 Base) MCG/ACT inhaler INHALE 1 TO 2 PUFFS BY MOUTH EVERY  SIX HOURS AS NEEDED FOR WHEEZING OR SHORTNESS OF BREATH ?Patient taking differently: Inhale 1 puff into the lungs every 6 (six) hours as needed for shortness of breath. 07/16/21   [name], MD  ?Gwenlyn Fudge

## 2022-04-11 NOTE — Progress Notes (Signed)
Pt refused ABG and said she knows its her CHF issue and doesn't want me to obtain ABG sample. Her [place] was on the floor her Spo2 on RA was 95%. Pt said she use O2 2l at home QHS. Placed on 2L [place] and SpO2 98%. Pt is not in any resp distress at this time. RN aware of the situation.  ?

## 2022-04-11 NOTE — ED Notes (Signed)
Pt refused ABG per RT. ?

## 2022-04-11 NOTE — ED Provider Notes (Addendum)
?  Physical Exam  ?BP 101/74 (BP Location: Right Arm)   Pulse 82   Temp 98 ?F (36.7 ?C) (Oral)   Resp 20   Ht '5\' 2"'$  (1.575 m)   Wt (!) 155.6 kg   LMP 04/08/2022   SpO2 96%   BMI 62.74 kg/m?  ? ?Physical Exam ?Vitals and nursing note reviewed.  ?Constitutional:   ?   General: She is not in acute distress. ?   Appearance: She is well-developed.  ?HENT:  ?   Head: Normocephalic and atraumatic.  ?Eyes:  ?   Conjunctiva/sclera: Conjunctivae normal.  ?Cardiovascular:  ?   Rate and Rhythm: Normal rate and regular rhythm.  ?   Heart sounds: No murmur heard. ?Pulmonary:  ?   Effort: Pulmonary effort is normal. Tachypnea present. No respiratory distress.  ?   Breath sounds: Normal breath sounds.  ?Abdominal:  ?   Palpations: Abdomen is soft.  ?   Tenderness: There is no abdominal tenderness.  ?Musculoskeletal:     ?   General: No swelling.  ?   Cervical back: Neck supple.  ?Skin: ?   General: Skin is warm and dry.  ?   Capillary Refill: Capillary refill takes less than 2 seconds.  ?Neurological:  ?   Mental Status: She is alert.  ?Psychiatric:     ?   Mood and Affect: Mood normal.  ? ? ?Procedures  ?Procedures ? ?ED Course / MDM  ? ?Clinical Course as of 04/11/22 1514  ?Sun Apr 11, 2022  ?1017 Pending delta trop, likely admission [MK]  ?  ?Clinical Course User Index ?[MK] Kimberly Nieland, Debe Coder, MD  ? ?Medical Decision Making ?Amount and/or Complexity of Data Reviewed ?Labs: ordered. ?Radiology: ordered. ? ?Risk ?Prescription drug management. ?Decision regarding hospitalization. ? ? ?Patient received in handoff.  CHF pending delta Trope with likely need for admission.  CT PE obtained which was reassuringly negative.  Patient received 160 of IV Lasix prior to my arrival.  Patient has required hospitalizations for diuresis in the past feeling exactly like this and she will require admission for appropriate diuresis.  With a creatinine of 1.3, hospitalist concerned for nephrotoxicity of this dosing and will admit the patient for  observation. ? ? ? ? ?  ?Teressa Lower, MD ?04/11/22 1515 ? ?  ?Teressa Lower, MD ?04/11/22 1515 ? ?

## 2022-04-11 NOTE — Progress Notes (Signed)
Patient on BIPAP and sleeping. Appears to be tolerating well at this time. RT will continue to monitor ?

## 2022-04-11 NOTE — Progress Notes (Signed)
RT placed pt on BIPAP (dream station) per MD order. Pt currently going to take nap. No distress noted at this time.  ?

## 2022-04-12 ENCOUNTER — Encounter: Payer: Medicaid Other | Admitting: Physical Therapy

## 2022-04-12 DIAGNOSIS — E1142 Type 2 diabetes mellitus with diabetic polyneuropathy: Secondary | ICD-10-CM | POA: Diagnosis present

## 2022-04-12 DIAGNOSIS — E1165 Type 2 diabetes mellitus with hyperglycemia: Secondary | ICD-10-CM | POA: Diagnosis present

## 2022-04-12 DIAGNOSIS — I5033 Acute on chronic diastolic (congestive) heart failure: Secondary | ICD-10-CM | POA: Diagnosis present

## 2022-04-12 DIAGNOSIS — I130 Hypertensive heart and chronic kidney disease with heart failure and stage 1 through stage 4 chronic kidney disease, or unspecified chronic kidney disease: Secondary | ICD-10-CM | POA: Diagnosis not present

## 2022-04-12 DIAGNOSIS — K219 Gastro-esophageal reflux disease without esophagitis: Secondary | ICD-10-CM | POA: Diagnosis present

## 2022-04-12 DIAGNOSIS — J449 Chronic obstructive pulmonary disease, unspecified: Secondary | ICD-10-CM | POA: Diagnosis present

## 2022-04-12 DIAGNOSIS — E11621 Type 2 diabetes mellitus with foot ulcer: Secondary | ICD-10-CM | POA: Diagnosis present

## 2022-04-12 DIAGNOSIS — E662 Morbid (severe) obesity with alveolar hypoventilation: Secondary | ICD-10-CM | POA: Diagnosis present

## 2022-04-12 DIAGNOSIS — Z6841 Body Mass Index (BMI) 40.0 and over, adult: Secondary | ICD-10-CM | POA: Diagnosis not present

## 2022-04-12 DIAGNOSIS — T508X5A Adverse effect of diagnostic agents, initial encounter: Secondary | ICD-10-CM | POA: Diagnosis present

## 2022-04-12 DIAGNOSIS — N179 Acute kidney failure, unspecified: Secondary | ICD-10-CM | POA: Diagnosis present

## 2022-04-12 DIAGNOSIS — Z794 Long term (current) use of insulin: Secondary | ICD-10-CM | POA: Diagnosis not present

## 2022-04-12 DIAGNOSIS — G2581 Restless legs syndrome: Secondary | ICD-10-CM | POA: Diagnosis present

## 2022-04-12 DIAGNOSIS — E1122 Type 2 diabetes mellitus with diabetic chronic kidney disease: Secondary | ICD-10-CM | POA: Diagnosis present

## 2022-04-12 DIAGNOSIS — Z79899 Other long term (current) drug therapy: Secondary | ICD-10-CM | POA: Diagnosis not present

## 2022-04-12 DIAGNOSIS — E785 Hyperlipidemia, unspecified: Secondary | ICD-10-CM | POA: Diagnosis present

## 2022-04-12 DIAGNOSIS — Z87892 Personal history of anaphylaxis: Secondary | ICD-10-CM | POA: Diagnosis not present

## 2022-04-12 DIAGNOSIS — J9621 Acute and chronic respiratory failure with hypoxia: Secondary | ICD-10-CM | POA: Diagnosis not present

## 2022-04-12 DIAGNOSIS — Z87891 Personal history of nicotine dependence: Secondary | ICD-10-CM | POA: Diagnosis not present

## 2022-04-12 DIAGNOSIS — E871 Hypo-osmolality and hyponatremia: Secondary | ICD-10-CM | POA: Diagnosis present

## 2022-04-12 DIAGNOSIS — G894 Chronic pain syndrome: Secondary | ICD-10-CM | POA: Diagnosis present

## 2022-04-12 DIAGNOSIS — Z8701 Personal history of pneumonia (recurrent): Secondary | ICD-10-CM | POA: Diagnosis not present

## 2022-04-12 DIAGNOSIS — F3181 Bipolar II disorder: Secondary | ICD-10-CM | POA: Diagnosis present

## 2022-04-12 DIAGNOSIS — N1831 Chronic kidney disease, stage 3a: Secondary | ICD-10-CM | POA: Diagnosis present

## 2022-04-12 LAB — BASIC METABOLIC PANEL
Anion gap: 11 (ref 5–15)
BUN: 70 mg/dL — ABNORMAL HIGH (ref 6–20)
CO2: 30 mmol/L (ref 22–32)
Calcium: 8.4 mg/dL — ABNORMAL LOW (ref 8.9–10.3)
Chloride: 93 mmol/L — ABNORMAL LOW (ref 98–111)
Creatinine, Ser: 1.72 mg/dL — ABNORMAL HIGH (ref 0.44–1.00)
GFR, Estimated: 37 mL/min — ABNORMAL LOW (ref >60)
Glucose, Bld: 366 mg/dL — ABNORMAL HIGH (ref 70–99)
Potassium: 4.5 mmol/L (ref 3.5–5.1)
Sodium: 134 mmol/L — ABNORMAL LOW (ref 135–145)

## 2022-04-12 LAB — URINALYSIS, ROUTINE W REFLEX MICROSCOPIC
Bilirubin Urine: NEGATIVE
Glucose, UA: 150 mg/dL — AB
Hgb urine dipstick: NEGATIVE
Ketones, ur: NEGATIVE mg/dL
Leukocytes,Ua: NEGATIVE
Nitrite: NEGATIVE
Protein, ur: NEGATIVE mg/dL
Specific Gravity, Urine: 1.017 (ref 1.005–1.030)
pH: 5 (ref 5.0–8.0)

## 2022-04-12 LAB — GLUCOSE, CAPILLARY
Glucose-Capillary: 297 mg/dL — ABNORMAL HIGH (ref 70–99)
Glucose-Capillary: 360 mg/dL — ABNORMAL HIGH (ref 70–99)
Glucose-Capillary: 366 mg/dL — ABNORMAL HIGH (ref 70–99)
Glucose-Capillary: 339 mg/dL — ABNORMAL HIGH (ref 70–99)
Glucose-Capillary: 369 mg/dL — ABNORMAL HIGH (ref 70–99)

## 2022-04-12 LAB — CREATININE, URINE, RANDOM: Creatinine, Urine: 73.59 mg/dL

## 2022-04-12 LAB — SODIUM, URINE, RANDOM: Sodium, Ur: 20 mmol/L

## 2022-04-12 MED ORDER — METOCLOPRAMIDE HCL 5 MG/ML IJ SOLN
5.0000 mg | Freq: Four times a day (QID) | INTRAMUSCULAR | Status: DC | PRN
  Administered 2022-04-12 – 2022-04-13 (×2): 5 mg via INTRAVENOUS
  Filled 2022-04-12 (×2): qty 2

## 2022-04-12 MED ORDER — SODIUM CHLORIDE 0.9 % IV SOLN
8.0000 mg | Freq: Four times a day (QID) | INTRAVENOUS | Status: DC | PRN
  Administered 2022-04-12 – 2022-04-14 (×2): 8 mg via INTRAVENOUS
  Filled 2022-04-12 (×4): qty 4

## 2022-04-12 MED ORDER — SODIUM CHLORIDE 0.9 % IV SOLN
150.0000 mg | Freq: Every day | INTRAVENOUS | Status: DC
  Filled 2022-04-12: qty 15

## 2022-04-12 MED ORDER — FUROSEMIDE 10 MG/ML IJ SOLN
80.0000 mg | Freq: Two times a day (BID) | INTRAMUSCULAR | Status: DC
  Administered 2022-04-12 – 2022-04-13 (×2): 80 mg via INTRAVENOUS
  Filled 2022-04-12 (×2): qty 8

## 2022-04-12 MED ORDER — INSULIN GLARGINE-YFGN 100 UNIT/ML ~~LOC~~ SOLN
40.0000 [IU] | Freq: Two times a day (BID) | SUBCUTANEOUS | Status: DC
  Administered 2022-04-12 – 2022-04-13 (×2): 40 [IU] via SUBCUTANEOUS
  Filled 2022-04-12 (×3): qty 0.4

## 2022-04-12 MED ORDER — INSULIN ASPART 100 UNIT/ML IJ SOLN
15.0000 [IU] | Freq: Three times a day (TID) | INTRAMUSCULAR | Status: DC
  Administered 2022-04-12 – 2022-04-13 (×3): 15 [IU] via SUBCUTANEOUS

## 2022-04-12 MED ORDER — ONDANSETRON HCL 4 MG/2ML IJ SOLN: 4.0000 mg | Freq: Four times a day (QID) | INTRAMUSCULAR | Status: DC | PRN

## 2022-04-12 MED ORDER — INSULIN ASPART 100 UNIT/ML IJ SOLN
10.0000 [IU] | Freq: Three times a day (TID) | INTRAMUSCULAR | Status: DC
  Administered 2022-04-12 (×2): 10 [IU] via SUBCUTANEOUS

## 2022-04-12 NOTE — Progress Notes (Signed)
?PROGRESS NOTE ? ? ? ?[name]  YPP:[id number] DOB: [birth year] DOA: 04/11/2022 ?PCP: [name], MD  ? ?Brief Narrative: ?46 year old past medical history of left foot osteomyelitis, left BKA, OSA, obesity hypoventilation syndrome, asthma, COPD, bipolar disorder, chronic pain syndrome, depression, type 2 diabetes, rectal fissure, history of SVT, urinary incontinence, presented with worsening shortness of breath, fluid retention and orthopnea.  Reported initial oxygen saturation 88 to 90% on 2 L.  She also reported left-sided sharp pain. ? ?Evaluation in the ED CT angio negative for PE.  Chest x-ray low lung volumes without radiographic evidence of acute pulmonary disease.  BNP not significantly elevated. ? ? ?Assessment & Plan: ?  ?Principal Problem: ?  Acute on chronic respiratory failure with hypoxia ([place]) ?Active Problems: ?  Insulin dependent type 2 diabetes mellitus ([place]) ?  Leukocytosis ?  Chronic kidney disease, stage 3a ([place]) ?  Diabetic ulcer of right foot ([place]) ?  Obesity hypoventilation syndrome ([place]) ?  OSA (obstructive sleep apnea) ?  Hypertension ?  Asthma, chronic ?  GERD ?  HLD (hyperlipidemia) ?  Diabetic neuropathy ([place]) ?  Diastolic HF (heart failure) ([place]) ?  Thrombocytosis ? ?1-Acute hypoxic respiratory failure: Heart failure exacerbation.  ?Multifactorial related to her hypoventilation syndrome, initially there was concern for acute diastolic heart failure exacerbation although chest x-ray negative for pulmonary edema, BNP not elevated. ?-He did receive a dose of IV Lasix. ?-She reported improvement of shortness of breath. ?-Monitor oxygen saturation ? ?2-Acute on chronic diastolic heart failure ?Continue with CPAP ?Received a  dose of IV Lasix in the ED ?I will consult nephrology for further diuretic.  Her creatinine has increased to 1.7 ? ? ?3-Obesity hypoventilation syndrome ?Continue with BiPAP ? ?Asthma chronic: Continue with Symbicort ? ?AKI on CKD stage III A ?Baseline  1.3--1.4 ?Creatinine  increased to 1.7.  Status post contrast.  On IV Lasix.  Nephrology has been consulted ?Check UA.  ? ?Chest pain: CT chest negative for PE ?Suspect related to hypoxemia ? ?Insulin-dependent Diabetes type 2 ?Continue with Semeglee increase dose to 40 BID>  ?Added meal coverage ? ?Thrombocytosis. : Reactive ? ?Leukocytosis: ? ?Diabetic ulcer right foot: ?She need to follow-up with ID for further recommendation of oral regimen for her fungal infection ?Continue with Doxy ? ?Diabetic  neuropathy: Continue with gabapentin, duloxetine, Percocet as needed ? ? ?Estimated body mass index is 62.54 kg/m? as calculated from the following: ?  Height as of this encounter: '5\' 2"'$  (1.575 m). ?  Weight as of this encounter: 155.1 kg. ? ? ?DVT prophylaxis: Lovenox ?Code Status: Full code ?Family Communication: care discussed with patient.  ?Disposition Plan:  ?Status is: Observation ?The patient will require care spanning > 2 midnights and should be moved to inpatient because: need diuresis.  ? ? ? ?Consultants:  ?Nephrology  ? ?Procedures:  ?None ? ?Antimicrobials:  ? ? ?Subjective: ?She is on BIPAP. She is breathing better.  ? ?Objective: ?Vitals:  ? 04/11/22 1700 04/11/22 2211 04/11/22 2252 04/12/22 0500  ?BP:  138/74    ?Pulse:  95 95   ?Resp:  16 13   ?Temp:  98 ?F (36.7 ?C)    ?TempSrc:  Oral    ?SpO2: 93% 92% 92%   ?Weight:    (!) 155.1 kg  ?Height:      ? ? ?Intake/Output Summary (Last 24 hours) at 04/12/2022 0940 ?Last data filed at 04/12/2022 0800 ?Gross per 24 hour  ?Intake 1200 ml  ?Output 600 ml  ?Net  600 ml  ? ?Filed Weights  ? 04/11/22 0534 04/12/22 0500  ?Weight: (!) 155.6 kg (!) 155.1 kg  ? ? ?Examination: ? ?General exam: Appears calm and comfortable  ?Respiratory system: BL crackles.  ?Cardiovascular system: S1 & S2 heard, RRR ?Gastrointestinal system: Abdomen is nondistended, soft and nontender. No organomegaly or masses felt. Normal bowel sounds heard. ?Central nervous system: Alert and oriented.  No focal neurological deficits. ?Extremities: left BKA.  ? ? ?Data Reviewed: I have personally reviewed following labs and imaging studies ? ?CBC: ?Recent Labs  ?Lab 04/11/22 ?0527  ?WBC 11.9*  ?NEUTROABS 6.0  ?HGB 12.5  ?HCT 37.9  ?MCV 90.5  ?PLT 458*  ? ?Basic Metabolic Panel: ?Recent Labs  ?Lab 04/11/22 ?1660 04/12/22 ?6301  ?NA 135 134*  ?K 4.1 4.5  ?CL 93* 93*  ?CO2 31 30  ?GLUCOSE 362* 366*  ?BUN 61* 70*  ?CREATININE 1.31* 1.72*  ?CALCIUM 8.8* 8.4*  ? ?GFR: ?Estimated Creatinine Clearance: 60.7 mL/min (A) (by C-G formula based on SCr of 1.72 mg/dL (H)). ?Liver Function Tests: ?Recent Labs  ?Lab 04/11/22 ?1141  ?AST 19  ?ALT 19  ?ALKPHOS 164*  ?BILITOT 0.4  ?PROT 8.0  ?ALBUMIN 3.0*  ? ?No results for input(s): LIPASE, AMYLASE in the last 168 hours. ?No results for input(s): AMMONIA in the last 168 hours. ?Coagulation Profile: ?No results for input(s): INR, PROTIME in the last 168 hours. ?Cardiac Enzymes: ?No results for input(s): CKTOTAL, CKMB, CKMBINDEX, TROPONINI in the last 168 hours. ?BNP (last 3 results) ?No results for input(s): PROBNP in the last 8760 hours. ?HbA1C: ?No results for input(s): HGBA1C in the last 72 hours. ?CBG: ?Recent Labs  ?Lab 04/11/22 ?1132 04/11/22 ?1629 04/11/22 ?2204 04/12/22 ?6010  ?GLUCAP 246* 297* 357* 360*  ? ?Lipid Profile: ?No results for input(s): CHOL, HDL, LDLCALC, TRIG, CHOLHDL, LDLDIRECT in the last 72 hours. ?Thyroid Function Tests: ?No results for input(s): TSH, T4TOTAL, FREET4, T3FREE, THYROIDAB in the last 72 hours. ?Anemia Panel: ?No results for input(s): VITAMINB12, FOLATE, FERRITIN, TIBC, IRON, RETICCTPCT in the last 72 hours. ?Sepsis Labs: ?No results for input(s): PROCALCITON, LATICACIDVEN in the last 168 hours. ? ?No results found for this or any previous visit (from the past 240 hour(s)).  ? ? ? ? ? ?Radiology Studies: ?CT Angio Chest PE W and/or Wo Contrast ? ?Result Date: 04/11/2022 ?CLINICAL DATA:  Shortness of breath. Morbid obesity high clinical probability  for pulmonary embolism. EXAM: CT ANGIOGRAPHY CHEST WITH CONTRAST TECHNIQUE: Multidetector CT imaging of the chest was performed using the standard protocol during bolus administration of intravenous contrast. Multiplanar CT image reconstructions and MIPs were obtained to evaluate the vascular anatomy. RADIATION DOSE REDUCTION: This exam was performed according to the departmental dose-optimization program which includes automated exposure control, adjustment of the mA and/or kV according to patient size and/or use of iterative reconstruction technique. CONTRAST:  7m OMNIPAQUE IOHEXOL 350 MG/ML SOLN COMPARISON:  11/07/2020 FINDINGS: Cardiovascular: Satisfactory opacification of pulmonary arteries noted, and no pulmonary emboli identified. No evidence of thoracic aortic dissection or aneurysm. Aortic and coronary atherosclerotic calcification noted. Mediastinum/Nodes: No masses or pathologically enlarged lymph nodes identified. Lungs/Pleura: No pulmonary mass, infiltrate, or effusion. Upper abdomen: No acute findings. Musculoskeletal: No suspicious bone lesions identified. Review of the MIP images confirms the above findings. IMPRESSION: No evidence of pulmonary embolism or other active disease. Aortic Atherosclerosis (ICD10-I70.0). Electronically Signed   By: JMarlaine HindM.D.   On: 04/11/2022 10:07  ? ?DG Chest Port 1 View ? ?Result Date:  04/11/2022 ?CLINICAL DATA:  46 year old female with history of shortness of breath. EXAM: PORTABLE CHEST 1 VIEW COMPARISON:  Chest x-ray 03/08/2022. FINDINGS: Lung volumes are low. Images under penetrated, limiting the diagnostic sensitivity and specificity of the examination. No consolidative airspace disease. No pleural effusions. No pneumothorax. No pulmonary nodule or mass noted. Pulmonary vasculature and the cardiomediastinal silhouette are within normal limits. IMPRESSION: 1. Low lung volumes without radiographic evidence of acute cardiopulmonary disease. Electronically  Signed   By: Vinnie Langton M.D.   On: 04/11/2022 06:20   ? ? ? ? ? ?Scheduled Meds: ? aspirin EC  81 mg Oral Daily  ? doxycycline  100 mg Oral BID  ? DULoxetine  60 mg Oral Daily  ? enoxaparin (LOVENOX) injecti

## 2022-04-12 NOTE — Progress Notes (Signed)
Inpatient Diabetes Program Recommendations ? ?AACE/ADA: New Consensus Statement on Inpatient Glycemic Control (2015) ? ?Target Ranges:  Prepandial:   less than 140 mg/dL ?     Peak postprandial:   less than 180 mg/dL (1-2 hours) ?     Critically ill patients:  140 - 180 mg/dL  ? ?Lab Results  ?Component Value Date  ? GLUCAP 366 (H) 04/12/2022  ? HGBA1C >15.5 (H) 12/19/2021  ? ? ?Review of Glycemic Control ? Latest Reference Range & Units 04/11/22 11:32 04/11/22 16:29 04/11/22 22:04 04/12/22 07:14 04/12/22 11:08  ?Glucose-Capillary 70 - 99 mg/dL 246 (H) 297 (H) 357 (H) 360 (H) 366 (H)  ? ?Diabetes history: DM 2 ?Outpatient Diabetes medications:  ?Tresiba 46 units bid ?Novolog 30 units tid ?Trulicity 3 mg weekly ?Current orders for Inpatient glycemic control:  ?Novolog 0-20 units tid with meals ?Novolog 10 units tid with meals ?Semglee 30 units bid ? ?Inpatient Diabetes Program Recommendations:   ? ?Please consider increasing Semglee to 40 units bid.  Also consider increasing Novolog to 15 units tid with meals.  ? ?Thanks,  ?Adah Perl, RN, BC-ADM ?Inpatient Diabetes Coordinator ?Pager [phone]  (8a-5p) ? ? ? ?

## 2022-04-12 NOTE — Plan of Care (Signed)
?  Problem: Cardiac: ?Goal: Ability to achieve and maintain adequate cardiopulmonary perfusion will improve ?Outcome: Progressing ?  ?Problem: Activity: ?Goal: Capacity to carry out activities will improve ?Outcome: Progressing ?  ?Problem: Cardiac: ?Goal: Ability to achieve and maintain adequate cardiopulmonary perfusion will improve ?Outcome: Progressing ?  ?Problem: Education: ?Goal: Knowledge of General Education information will improve ?Description: Including pain rating scale, medication(s)/side effects and non-pharmacologic comfort measures ?Outcome: Progressing ?  ?Problem: Pain Managment: ?Goal: General experience of comfort will improve ?Outcome: Progressing ?  ?

## 2022-04-12 NOTE — Consult Note (Signed)
WOC Nurse Consult Note: ?Patient receiving care in [facility]. ?Reason for Consult: Right foot amputation site wound, left stump wound. Patient agreed, after first refusing, to allow me to see the right foot wound, but refused for the left stump. ?Wound type: full thickness surgical site wound to right foot. ?Pressure Injury POA: NA ?Measurement: 2 cm x 2 cm x 0.4 cm ?Wound bed: 100% pink ?Drainage (amount, consistency, odor) none ?Periwound: intact ?Dressing procedure/placement/frequency: ?Place saline moistened gauze into the right foot wound, cover with dry gauze, tape in place. Perform each shift. ? ?The patient tells me that on the left stump, which she did not want me to assess, there is a scab. She does not apply anything to this area.   ? ?Monitor the wound area(s) for worsening of condition such as: ?Signs/symptoms of infection,  ?Increase in size,  ?Development of or worsening of odor, ?Development of pain, or increased pain at the affected locations.  Notify the medical team if any of these develop. ? ?Thank you for the consult. [facility] nurse will not follow at this time.  Please re-consult the [facility] team if needed. ? ?Val Riles, RN, MSN, CWOCN, CNS-BC, pager [phone]  ?  ?

## 2022-04-12 NOTE — Consult Note (Signed)
[facility]  INPATIENT CONSULTATION  Reason for Consultation: AKI Requesting Provider: Dr. Sunnie Nielsen  HPI: [name] is an 46 y.o. 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   Admitted 5/7 via ED where she presented via EMS with dypsnea, edema and orthopnea.  O2 sat was 88% on 2L.  She had some transient L sided CP as well.  She underwent CTA chest r/o PE - no pathology noted.   She's received lasix 160 IV x 1 dose.  Recorded I/Os 960 / 600, wt down 0.5kg from yesterday.   Cr was 1.2 in 02/2022, was 1.3 on presentation yesterday and 1.7 today.    Admitted 3/3-02/07/22 [facility] volume overload.  Diuresed with lasix 80 IV BID and symptoms improved.  Appears outpt dose torsemide 80 daily.  Says adherent to this and low na diet - no restaurant food.   She says she feels the same as the March admission.  12/2021 R foot ulcer, AKI Cr 1.7, fluids 10/2021 admission L groin abscess c/b severe AKI - cr peaked 5.6, no dialysis needed.   PMH: Past Medical History:  Diagnosis Date   Acute osteomyelitis of ankle or foot, left ([name]) 01/31/2018   Alveolar hypoventilation    Anemia    not on iron pill   Asthma    Bipolar 2 disorder ([name])    Carpal tunnel syndrome on right    recurrent   Cellulitis 08/2010-08/2011   Chronic pain    COPD (chronic obstructive pulmonary disease) ([name])    Symbicort daily and Proventil as needed   Costochondritis    Depression    Diabetes mellitus type II, uncontrolled 2000   Type 2, Uncontrolled.Takes Lantus daily.Fasting blood sugar runs 150   Drug-seeking behavior    GERD (gastroesophageal reflux disease)    takes Pantoprazole and Zantac daily   HLD (hyperlipidemia)    takes Atorvastatin daily    Hypertension    takes Lisinopril and Coreg daily   Morbid obesity ([name])    Nocturia    OSA on CPAP    Peripheral neuropathy    takes Gabapentin daily   Pneumonia    "walking" several yrs ago and as a baby (12/05/2018)   Rectal fissure    Restless leg    SVT (supraventricular tachycardia) ([name])    Syncope 02/25/2016   Urinary frequency    Urinary incontinence 10/23/2020   Varicose veins    Right medial thigh and Left leg    PSH: Past Surgical History:  Procedure Laterality Date   AMPUTATION Left 02/01/2018   Procedure: LEFT FOURTH AND 5TH TOE RAY AMPUTATION;  Surgeon: Nadara Mustard, MD;  Location: [facility] OR;  Service: Orthopedics;  Laterality: Left;   AMPUTATION Left 03/03/2018   Procedure: LEFT BELOW KNEE AMPUTATION;  Surgeon: Nadara Mustard, MD;  Location: [facility] OR;  Service: Orthopedics;  Laterality: Left;   CARPAL TUNNEL RELEASE Bilateral    CESAREAN SECTION  2007   CORONARY ANGIOGRAPHY N/A 11/26/2020   Procedure: CORONARY ANGIOGRAPHY;  Surgeon: Tonny Bollman, MD;  Location: [facility] INVASIVE CV LAB;  Service: Cardiovascular;  Laterality: N/A;   FOOT AMPUTATION Right    INCISION AND DRAINAGE ABSCESS Left 10/16/2021   Procedure: INCISION AND DRAINAGE ABSCESS;  Surgeon: Fritzi Mandes, MD;  Location: [facility] OR;  Service: General;  Laterality: Left;   INCISION AND DRAINAGE PERIRECTAL ABSCESS Left 05/18/2019   Procedure: IRRIGATION AND DEBRIDEMENT OF PANNIS ABSCESS, POSSIBLE DEBRIDEMENT OF BUTTOCK WOUND;  Surgeon: Manus Rudd, MD;  Location: [facility] OR;  Service: General;  Laterality: Left;   IRRIGATION AND DEBRIDEMENT BUTTOCKS Left 05/17/2019   Procedure: IRRIGATION AND DEBRIDEMENT BUTTOCKS;  Surgeon: Manus Rudd, MD;  Location: [facility] OR;  Service: General;  Laterality: Left;   KNEE ARTHROSCOPY Right 07/17/2010   LEFT HEART CATHETERIZATION WITH CORONARY ANGIOGRAM N/A 07/27/2012   Procedure: LEFT HEART CATHETERIZATION WITH CORONARY ANGIOGRAM;  Surgeon: Tonny Bollman, MD;  Location: [facility] CATH  LAB;  Service: Cardiovascular;  Laterality: N/A;   MASS EXCISION N/A 06/29/2013   Procedure:  WIDE LOCAL EXCISION OF POSTERIOR NECK ABSCESS;  Surgeon: Axel Filler, MD;  Location: [facility] OR;  Service: General;  Laterality: N/A;   REPAIR KNEE LIGAMENT Left    "fixed ligaments and chipped patella"   right transmetatarsal amputation      Past Medical History:  Diagnosis Date   Acute osteomyelitis of ankle or foot, left ([name]) 01/31/2018   Alveolar hypoventilation    Anemia    not on iron pill   Asthma    Bipolar 2 disorder ([name])    Carpal tunnel syndrome on right    recurrent   Cellulitis 08/2010-08/2011   Chronic pain    COPD (chronic obstructive pulmonary disease) ([name])    Symbicort daily and Proventil as needed   Costochondritis    Depression    Diabetes mellitus type II, uncontrolled 2000   Type 2, Uncontrolled.Takes Lantus daily.Fasting blood sugar runs 150   Drug-seeking behavior    GERD (gastroesophageal reflux disease)    takes Pantoprazole and Zantac daily   HLD (hyperlipidemia)    takes Atorvastatin daily   Hypertension    takes Lisinopril and Coreg daily   Morbid obesity ([name])    Nocturia    OSA on CPAP    Peripheral neuropathy    takes Gabapentin daily   Pneumonia    "walking" several yrs ago and as a baby (12/05/2018)   Rectal fissure    Restless leg    SVT (supraventricular tachycardia) ([name])    Syncope 02/25/2016   Urinary frequency    Urinary incontinence 10/23/2020   Varicose veins    Right medial thigh and Left leg     Medications:  I have reviewed the patient's current medications.  Medications Prior to Admission  Medication Sig Dispense Refill   Accu-Chek FastClix Lancets MISC USE DAILY AS INSTRUCTED 102 each 3   ACCU-CHEK GUIDE test strip USE T0 CHECK BLOOD SUGAR 3 TIMES DAILY 100 strip 5   Accu-Chek Softclix Lancets lancets Use to check blood glucose four times daily 100 each 12   albuterol (VENTOLIN HFA) 108 (90 Base) MCG/ACT inhaler INHALE 1  TO 2 PUFFS BY MOUTH EVERY SIX HOURS AS NEEDED FOR WHEEZING OR SHORTNESS OF BREATH (Patient taking differently: Inhale 1 puff into the lungs every 6 (six) hours as needed for shortness of breath.) 6.7 g 4   aspirin EC 81 MG tablet Take 1 tablet (81 mg total) by mouth daily. Swallow whole. 90 tablet 3   cetirizine (ZYRTEC) 10 MG tablet Take 1 tablet (10 mg total) by mouth daily as needed for allergies. 30 tablet 3   Cholecalciferol 1000 units tablet Take 1 tablet (1,000 Units total) by mouth daily. 90 tablet 0   Continuous Blood Gluc Sensor (DEXCOM G6 SENSOR) MISC  Inject 1 applicator into the skin as directed. Change sensor every 10 days. 3 each 11   Continuous Blood Gluc Transmit (DEXCOM G6 TRANSMITTER) MISC Inject 1 Device into the skin as directed. Reuse 8 times with sensor changes. 1 each 3   doxycycline (VIBRA-TABS) 100 MG tablet Take 1 tablet (100 mg total) by mouth 2 (two) times daily for 12 days. 24 tablet 0   DULoxetine (CYMBALTA) 30 MG capsule Take 1 capsule (30 mg total) by mouth daily. X 1 week; then 60 mg daily- for knee and back/nerve pain 60 capsule 5   gabapentin (NEURONTIN) 600 MG tablet Take 2 tablets (1,200 mg total) by mouth 3 (three) times daily. 180 tablet 2   Glucagon (BAQSIMI ONE PACK) 3 MG/DOSE POWD Place 3 mg into the nose as needed (low blood sugar).     Glucagon, rDNA, (GLUCAGON EMERGENCY) 1 MG KIT Inject 1 mg into the muscle as needed for low blood sugar.     GNP ULTICARE PEN NEEDLES 32G X 4 MM MISC USE TO inject insulin 2 TIMES DAILY 100 each 2   insulin aspart (NOVOLOG FLEXPEN) 100 UNIT/ML FlexPen Inject 20 Units into the skin 3 (three) times daily with meals. (Patient taking differently: Inject 30 Units into the skin 3 (three) times daily with meals. Sliding scale) 45 mL 3   insulin degludec (TRESIBA FLEXTOUCH) 200 UNIT/ML FlexTouch Pen Inject 46 Units into the skin 2 (two) times daily. (Patient taking differently: Inject 30 Units into the skin 2 (two) times daily.) 30 mL 3    micafungin in sodium chloride 0.9 % 100 mL Inject into the vein daily. Per ID at [facility]. Devices MISC Inserts and toe filler for R foot 1 Device 0   nystatin (MYCOSTATIN/NYSTOP) powder Apply 1 application. topically 3 (three) times daily. 30 g 0   ondansetron (ZOFRAN) 4 MG tablet Take 1 tablet (4 mg total) by mouth every 8 (eight) hours as needed for nausea or vomiting. 60 tablet 1   oxyCODONE-acetaminophen (PERCOCET) 7.5-325 MG tablet Take 1 tablet by mouth every 6 (six) hours as needed for severe pain. 120 tablet 0   potassium chloride SA (KLOR-CON M20) 20 MEQ tablet Take 1 tablet (20 mEq total) by mouth daily. 30 tablet 6   prednisoLONE acetate (PRED FORTE) 1 % ophthalmic suspension Place 1 drop into both eyes 4 (four) times daily.     ramelteon (ROZEREM) 8 MG tablet Take 1 tablet (8 mg total) by mouth at bedtime. 90 tablet 0   SYMBICORT 160-4.5 MCG/ACT inhaler inhale 2 PUFFS into THE lungs 2 TIMES DAILY (Patient taking differently: 2 puffs every 6 (six) hours. Shortness of breath) 10.2 g 4   Tiotropium Bromide Monohydrate (SPIRIVA RESPIMAT) 1.25 MCG/ACT AERS Inhale 2 puffs into the lungs daily. 1 each 0   torsemide (DEMADEX) 20 MG tablet Take 4 tablets (80 mg total) by mouth daily. 120 tablet 2   TRULICITY 3 MG/0.5ML SOPN Inject 3 mg into the skin once a week. Sundays      ALLERGIES:   Allergies  Allergen Reactions   Cefepime Other (See Comments)    AKI, see records from [facility] hospitalization in January 2020.   Other reaction(s): Other (See Comments) "Shut down organs/kidneys"  Note pt has tolerated Rocephin and Keflex   Kiwi Extract Shortness Of Breath, Swelling and Anaphylaxis   Morphine And Related Nausea And Vomiting   Nalbuphine     Other reaction(s): Hallucinations, Other (See Comments) "FEELS LIKE SOMETHING  CRAWLING ON ME" Reaction:  Nervousness "FEELS LIKE SOMETHING CRAWLING ON ME" Reaction:  Nervousness Reaction:  Nervousness "FEELS LIKE SOMETHING CRAWLING ON  ME"    Trental [Pentoxifylline] Nausea And Vomiting   Toradol [Ketorolac Tromethamine] Other (See Comments)    Feels like something is crawling on me   Morphine Nausea And Vomiting   Nubain [Nalbuphine Hcl] Other (See Comments)    "FEELS LIKE SOMETHING CRAWLING ON ME"    FAM HX: Family History  Problem Relation Age of Onset   Diabetes Mother    Hyperlipidemia Mother    Depression Mother    GER disease Mother    Allergic rhinitis Mother    Restless legs syndrome Mother    Heart attack Paternal Uncle    Heart disease Paternal Grandmother    Heart attack Paternal Grandmother    Heart attack Paternal Grandfather    Heart disease Paternal Grandfather    Heart attack Father    Migraines Sister    Cancer Maternal Grandmother        COLON   Hypertension Maternal Grandmother    Hyperlipidemia Maternal Grandmother    Diabetes Maternal Grandmother    Other Maternal Grandfather        GUN SHOT   Anxiety disorder Sister    Asthma Child     Social History:   reports that she quit smoking about 16 years ago. Her smoking use included cigarettes. She has a 3.75 pack-year smoking history. She has never used smokeless tobacco. She reports that she does not currently use drugs. She reports that she does not drink alcohol.  ROS: 12 system ROS neg except per HPI above  Blood pressure 138/74, pulse 95, temperature 98 F (36.7 C), temperature source Oral, resp. rate 13, height 5\' 2"  (1.575 m), weight (!) 155.1 kg, last menstrual period 04/08/2022, SpO2 92 %. PHYSICAL EXAM: Gen: obese woman lying flat in bed with CPAP on  Eyes: anicteric ENT: MMM Neck: thick, can't assess JVD CV: RRR Abd: soft, nontender, ?distended - she thinks it's a bit full Lungs: BS distant - can't appreciate any rales GU: no foley Extr:  L BKA, R TMA, compression stockings on Neuro:  conversant Skin: no rashes noted   Results for orders placed or performed during the hospital encounter of 04/11/22 (from the past  48 hour(s))  Basic metabolic panel     Status: Abnormal   Collection Time: 04/11/22  5:27 AM  Result Value Ref Range   Sodium 135 135 - 145 mmol/L   Potassium 4.1 3.5 - 5.1 mmol/L   Chloride 93 (L) 98 - 111 mmol/L   CO2 31 22 - 32 mmol/L   Glucose, Bld 362 (H) 70 - 99 mg/dL    Comment: Glucose reference range applies only to samples taken after fasting for at least 8 hours.   BUN 61 (H) 6 - 20 mg/dL   Creatinine, Ser 1.61 (H) 0.44 - 1.00 mg/dL   Calcium 8.8 (L) 8.9 - 10.3 mg/dL   GFR, Estimated 52 (L) >60 mL/min    Comment: (NOTE) Calculated using the CKD-EPI Creatinine Equation (2021)    Anion gap 11 5 - 15    Comment: Performed at [facility], [address], [place], [place] [address]  CBC with Differential     Status: Abnormal   Collection Time: 04/11/22  5:27 AM  Result Value Ref Range   WBC 11.9 (H) 4.0 - 10.5 K/uL   RBC 4.19 3.87 - 5.11 MIL/uL  Hemoglobin 12.5 12.0 - 15.0 g/dL   HCT 82.9 56.2 - 13.0 %   MCV 90.5 80.0 - 100.0 fL   MCH 29.8 26.0 - 34.0 pg   MCHC 33.0 30.0 - 36.0 g/dL   RDW 86.5 78.4 - 69.6 %   Platelets 458 (H) 150 - 400 K/uL   nRBC 0.0 0.0 - 0.2 %   Neutrophils Relative % 50 %   Neutro Abs 6.0 1.7 - 7.7 K/uL   Lymphocytes Relative 39 %   Lymphs Abs 4.6 (H) 0.7 - 4.0 K/uL   Monocytes Relative 6 %   Monocytes Absolute 0.7 0.1 - 1.0 K/uL   Eosinophils Relative 3 %   Eosinophils Absolute 0.3 0.0 - 0.5 K/uL   Basophils Relative 1 %   Basophils Absolute 0.1 0.0 - 0.1 K/uL   Immature Granulocytes 1 %   Abs Immature Granulocytes 0.13 (H) 0.00 - 0.07 K/uL    Comment: Performed at [facility], [address] [place], [place] [address]  Troponin I (High Sensitivity)     Status: Abnormal   Collection Time: 04/11/22  5:27 AM  Result Value Ref Range   Troponin I (High Sensitivity) 22 (H) <18 ng/L    Comment: (NOTE) Elevated high sensitivity troponin I (hsTnI) values and significant  changes across serial  measurements may suggest ACS but many other  chronic and acute conditions are known to elevate hsTnI results.  Refer to the "Links" section for chest pain algorithms and additional  guidance. Performed at [facility], [address] [place], [place] [address]   Brain natriuretic peptide     Status: None   Collection Time: 04/11/22  5:27 AM  Result Value Ref Range   B Natriuretic Peptide 30.1 0.0 - 100.0 pg/mL    Comment: Performed at [facility], [address] [place], [place] [address]  Troponin I (High Sensitivity)     Status: Abnormal   Collection Time: 04/11/22  8:23 AM  Result Value Ref Range   Troponin I (High Sensitivity) 21 (H) <18 ng/L    Comment: (NOTE) Elevated high sensitivity troponin I (hsTnI) values and significant  changes across serial measurements may suggest ACS but many other  chronic and acute conditions are known to elevate hsTnI results.  Refer to the "Links" section for chest pain algorithms and additional  guidance. Performed at [facility], [address], [place], [place] [address]   Blood gas, venous (at [facility] and [facility], not at [facility])     Status: Abnormal   Collection Time: 04/11/22  8:45 AM  Result Value Ref Range   pH, Ven 7.4 7.25 - 7.43   pCO2, Ven 56 44 - 60 mmHg   pO2, Ven 58 (H) 32 - 45 mmHg   Bicarbonate 34.7 (H) 20.0 - 28.0 mmol/L   Acid-Base Excess 8.0 (H) 0.0 - 2.0 mmol/L   O2 Saturation 91 %   Patient temperature 37.0     Comment: Performed at [facility], [address], [place], [place] [address]  CBG monitoring, ED     Status: Abnormal   Collection Time: 04/11/22 11:32 AM  Result Value Ref Range   Glucose-Capillary 246 (H) 70 - 99 mg/dL    Comment: Glucose reference range applies only to samples taken after fasting for at least 8 hours.  Hepatic function panel     Status: Abnormal   Collection Time: 04/11/22 11:41 AM  Result Value Ref Range  Total Protein  8.0 6.5 - 8.1 g/dL   Albumin 3.0 (L) 3.5 - 5.0 g/dL   AST 19 15 - 41 U/L   ALT 19 0 - 44 U/L   Alkaline Phosphatase 164 (H) 38 - 126 U/L   Total Bilirubin 0.4 0.3 - 1.2 mg/dL   Bilirubin, Direct <1.6 0.0 - 0.2 mg/dL   Indirect Bilirubin NOT CALCULATED 0.3 - 0.9 mg/dL    Comment: Performed at [facility], [address], [place], [place] [address]  Glucose, capillary     Status: Abnormal   Collection Time: 04/11/22  4:29 PM  Result Value Ref Range   Glucose-Capillary 297 (H) 70 - 99 mg/dL    Comment: Glucose reference range applies only to samples taken after fasting for at least 8 hours.   Comment 1 Notify RN   Glucose, capillary     Status: Abnormal   Collection Time: 04/11/22 10:04 PM  Result Value Ref Range   Glucose-Capillary 357 (H) 70 - 99 mg/dL    Comment: Glucose reference range applies only to samples taken after fasting for at least 8 hours.  Basic metabolic panel     Status: Abnormal   Collection Time: 04/12/22  3:49 AM  Result Value Ref Range   Sodium 134 (L) 135 - 145 mmol/L   Potassium 4.5 3.5 - 5.1 mmol/L   Chloride 93 (L) 98 - 111 mmol/L   CO2 30 22 - 32 mmol/L   Glucose, Bld 366 (H) 70 - 99 mg/dL    Comment: Glucose reference range applies only to samples taken after fasting for at least 8 hours.   BUN 70 (H) 6 - 20 mg/dL   Creatinine, Ser 4.54 (H) 0.44 - 1.00 mg/dL   Calcium 8.4 (L) 8.9 - 10.3 mg/dL   GFR, Estimated 37 (L) >60 mL/min    Comment: (NOTE) Calculated using the CKD-EPI Creatinine Equation (2021)    Anion gap 11 5 - 15    Comment: Performed at [facility], [address] [place], [place] [address]  Glucose, capillary     Status: Abnormal   Collection Time: 04/12/22  7:14 AM  Result Value Ref Range   Glucose-Capillary 360 (H) 70 - 99 mg/dL    Comment: Glucose reference range applies only to samples taken after fasting for at least 8 hours.   *Note: Due to a large number of results and/or encounters for  the requested time period, some results have not been displayed. A complete set of results can be found in Results Review.    CT Angio Chest PE W and/or Wo Contrast  Result Date: 04/11/2022 CLINICAL DATA:  Shortness of breath. Morbid obesity high clinical probability for pulmonary embolism. EXAM: CT ANGIOGRAPHY CHEST WITH CONTRAST TECHNIQUE: Multidetector CT imaging of the chest was performed using the standard protocol during bolus administration of intravenous contrast. Multiplanar CT image reconstructions and MIPs were obtained to evaluate the vascular anatomy. RADIATION DOSE REDUCTION: This exam was performed according to the departmental dose-optimization program which includes automated exposure control, adjustment of the mA and/or kV according to patient size and/or use of iterative reconstruction technique. CONTRAST:  80mL OMNIPAQUE IOHEXOL 350 MG/ML SOLN COMPARISON:  11/07/2020 FINDINGS: Cardiovascular: Satisfactory opacification of pulmonary arteries noted, and no pulmonary emboli identified. No evidence of thoracic aortic dissection or aneurysm. Aortic and coronary atherosclerotic calcification noted. Mediastinum/Nodes: No masses or pathologically enlarged lymph nodes identified. Lungs/Pleura: No pulmonary mass, infiltrate, or effusion. Upper abdomen: No acute findings. Musculoskeletal:  No suspicious bone lesions identified. Review of the MIP images confirms the above findings. IMPRESSION: No evidence of pulmonary embolism or other active disease. Aortic Atherosclerosis (ICD10-I70.0). Electronically Signed   By: Danae Orleans M.D.   On: 04/11/2022 10:07   DG Chest Port 1 View  Result Date: 04/11/2022 CLINICAL DATA:  46 year old female with history of shortness of breath. EXAM: PORTABLE CHEST 1 VIEW COMPARISON:  Chest x-ray 03/08/2022. FINDINGS: Lung volumes are low. Images under penetrated, limiting the diagnostic sensitivity and specificity of the examination. No consolidative airspace disease. No  pleural effusions. No pneumothorax. No pulmonary nodule or mass noted. Pulmonary vasculature and the cardiomediastinal silhouette are within normal limits. IMPRESSION: 1. Low lung volumes without radiographic evidence of acute cardiopulmonary disease. Electronically Signed   By: Trudie Reed M.D.   On: 04/11/2022 06:20    Assessment/Plan   **AHRF - on PRN O2 qhs at home, now requiring 4L [place].  CTA unrevealing.  Exam difficult but she subjectively reports orthopnea and weight gain.  I think diuresis is very reasonable - will treat with lasix 80 IV BID today  **AKI on CKD 3a:  Baseline Cr recently in the 1.3-1.4mg /dL range.  Was 1.3 on presentation and 1.72mg /dL today after IV contrast yesterday.  UA 5/2 not consistent with GN; repeat UA today.  Urine lytes won't be helpful contrast vs CHF vs prerenal so won't order.  Renal US 10/2021 ok, no need to repeat; will check PVR.   In clinical context of presentation I think diuresis is very reasonable as her AKI is likely contrast mediated, not hypovolemia.  Will follow daily labs.   **HFpEF:  diuresis per above, 2g Na diet.   **Hyponatremia: mild, presumably hypervolemic.  Follow with diuresis.   **DM:  poorly controlled. Insulin per primary.  **morbid obesity **HTN: BP reasonable, follow with diuresis.    Tyler Pita 04/12/2022, 11:02 AM

## 2022-04-13 DIAGNOSIS — J9621 Acute and chronic respiratory failure with hypoxia: Secondary | ICD-10-CM | POA: Diagnosis not present

## 2022-04-13 LAB — CBC
HCT: 34.7 % — ABNORMAL LOW (ref 36.0–46.0)
Hemoglobin: 11.2 g/dL — ABNORMAL LOW (ref 12.0–15.0)
MCH: 29.5 pg (ref 26.0–34.0)
MCHC: 32.3 g/dL (ref 30.0–36.0)
MCV: 91.3 fL (ref 80.0–100.0)
Platelets: 377 10*3/uL (ref 150–400)
RBC: 3.8 MIL/uL — ABNORMAL LOW (ref 3.87–5.11)
RDW: 13.2 % (ref 11.5–15.5)
WBC: 9.4 10*3/uL (ref 4.0–10.5)
nRBC: 0 % (ref 0.0–0.2)

## 2022-04-13 LAB — RENAL FUNCTION PANEL
Albumin: 2.8 g/dL — ABNORMAL LOW (ref 3.5–5.0)
Anion gap: 10 (ref 5–15)
BUN: 72 mg/dL — ABNORMAL HIGH (ref 6–20)
CO2: 28 mmol/L (ref 22–32)
Calcium: 8.5 mg/dL — ABNORMAL LOW (ref 8.9–10.3)
Chloride: 94 mmol/L — ABNORMAL LOW (ref 98–111)
Creatinine, Ser: 1.62 mg/dL — ABNORMAL HIGH (ref 0.44–1.00)
GFR, Estimated: 40 mL/min — ABNORMAL LOW (ref >60)
Glucose, Bld: 330 mg/dL — ABNORMAL HIGH (ref 70–99)
Phosphorus: 5.2 mg/dL — ABNORMAL HIGH (ref 2.5–4.6)
Potassium: 4.3 mmol/L (ref 3.5–5.1)
Sodium: 132 mmol/L — ABNORMAL LOW (ref 135–145)

## 2022-04-13 LAB — GLUCOSE, CAPILLARY
Glucose-Capillary: 305 mg/dL — ABNORMAL HIGH (ref 70–99)
Glucose-Capillary: 327 mg/dL — ABNORMAL HIGH (ref 70–99)
Glucose-Capillary: 377 mg/dL — ABNORMAL HIGH (ref 70–99)
Glucose-Capillary: 322 mg/dL — ABNORMAL HIGH (ref 70–99)

## 2022-04-13 MED ORDER — INSULIN ASPART 100 UNIT/ML IJ SOLN
20.0000 [IU] | Freq: Three times a day (TID) | INTRAMUSCULAR | Status: DC
  Administered 2022-04-13 – 2022-04-14 (×3): 20 [IU] via SUBCUTANEOUS

## 2022-04-13 MED ORDER — INSULIN GLARGINE-YFGN 100 UNIT/ML ~~LOC~~ SOLN
45.0000 [IU] | Freq: Two times a day (BID) | SUBCUTANEOUS | Status: DC
  Administered 2022-04-13 – 2022-04-14 (×2): 45 [IU] via SUBCUTANEOUS
  Filled 2022-04-13 (×3): qty 0.45

## 2022-04-13 MED ORDER — TORSEMIDE 20 MG PO TABS
80.0000 mg | ORAL_TABLET | Freq: Two times a day (BID) | ORAL | Status: DC
Start: 2022-04-13 — End: 2022-04-14
  Administered 2022-04-13 – 2022-04-14 (×2): 80 mg via ORAL
  Filled 2022-04-13 (×3): qty 4

## 2022-04-13 NOTE — TOC Progression Note (Signed)
Transition of Care (TOC) - Progression Note  ? ? ?Patient Details  ?Name: Roann Merk ?MRN: [id number] ?Date of Birth: December 17, 1975 ? ?Transition of Care (TOC) CM/SW Contact  ?Leeroy Cha, RN ?Phone Number: ?04/13/2022, 9:40 AM ? ?Clinical Narrative:    ?[facility] and [facility] that have home heart failure programs have refused pt due to insurance.  Unable to provide. ? ? ?  ?  ? ?Expected Discharge Plan and Services ?  ?  ?  ?  ?  ?                ?  ?  ?  ?  ?  ?  ?  ?  ?  ?  ? ? ?Social Determinants of Health (SDOH) Interventions ?  ? ?Readmission Risk Interventions ?   ? View : No data to display.  ?  ?  ?  ? ? ?

## 2022-04-13 NOTE — Progress Notes (Signed)
Inpatient Diabetes Program Recommendations ? ?AACE/ADA: New Consensus Statement on Inpatient Glycemic Control (2015) ? ?Target Ranges:  Prepandial:   less than 140 mg/dL ?     Peak postprandial:   less than 180 mg/dL (1-2 hours) ?     Critically ill patients:  140 - 180 mg/dL  ? ?Lab Results  ?Component Value Date  ? GLUCAP 305 (H) 04/13/2022  ? HGBA1C >15.5 (H) 12/19/2021  ? ? ?Review of Glycemic Control ? Latest Reference Range & Units 04/12/22 11:08 04/12/22 16:19 04/12/22 22:09 04/13/22 07:36  ?Glucose-Capillary 70 - 99 mg/dL 366 (H) 339 (H) 369 (H) 305 (H)  ?(H): Data is abnormally high ? ?Diabetes history: DM2 ?Outpatient Diabetes medications: Tresiba 46 BID, Novolog 30 TID and SSI  ?Current orders for Inpatient glycemic control: Semglee 40 BID, Novolog 0-20 units TID and Novolog 15 units TID, Trulicity 3 mg every Sunday ? ?Inpatient Diabetes Program Recommendations:   ? ?Semglee 50 units BID, 25 units TID with meals, Novolog HS scale ? ?Patient is well known to our team.  Met with her at bedside and confirmed above home medications.  She is current with [facility] endocrinology.  She shows me her last A1C on My Chart is 10.8% on 4.3.23.  She admits to forgetting to take her Novolog with meals.  Her Endo has encouraged her to administer Novolog 15 minutes prior eating and she often forgets and administers after her meal.  Explained rationale of administering 15 minutes prior.  She also states she has an Omnipod at home.  She has not worn it "in awhile".  She does not know her settings.  She has been checking her BG at home and they are consistently elevated.  She admits to drinking Dr. Malachi Bonds and has recently switched to Dr. Dawayne Cirri.   ? ?She has an appointment with her endo in June.  Encouraged her to keep follow up appointments, administer insulin prior to eating and be mindful of CHO's.   ? ?Will continue to follow while inpatient. ? ?Thank you, ?Reche Dixon, MSN, RN ?Diabetes Coordinator ?Inpatient Diabetes  Program ?[phone] (team pager from 8a-5p) ? ? ? ?

## 2022-04-13 NOTE — Progress Notes (Signed)
?[facility] ?Progress Note  ? ?Subjective:   Feels much improved after IV diuresis and feels back to baseline.  I/Os yest 1.1 / 1.85L with 1 dose of lasix 80 IV yest PM, had 2nd this AM.  Cr 1.6 today from 1.72.  ? ?Objective ?Vitals:  ? 04/12/22 1256 04/12/22 2213 04/13/22 1937 04/13/22 0659  ?BP: 138/71 (!) 153/93  (!) 121/59  ?Pulse: 94 95  87  ?Resp:  16    ?Temp: 98.6 ?F (37 ?C) 97.6 ?F (36.4 ?C)  98.2 ?F (36.8 ?C)  ?TempSrc: Oral Oral  Oral  ?SpO2: 90% 91%  94%  ?Weight:   (!) 156.6 kg   ?Height:      ? ?Physical Exam ?Gen: obese woman lying flat in bed with CPAP on  ?Eyes: anicteric ?ENT: MMM ?Neck: thick, can't assess JVD ?CV: RRR ?Abd: soft, nontender, ?distended - she thinks it's a bit full ?Lungs: BS distant - can't appreciate any rales ?GU: no foley ?Extr:  L BKA, R TMA, compression stockings on ?Neuro:  conversant ?Skin: no rashes noted  ? ?Additional Objective ?Labs: ?Basic Metabolic Panel: ?Recent Labs  ?Lab 04/11/22 ?9024 04/12/22 ?0973 04/13/22 ?0419  ?NA 135 134* 132*  ?K 4.1 4.5 4.3  ?CL 93* 93* 94*  ?CO2 '31 30 28  '$ ?GLUCOSE 362* 366* 330*  ?BUN 61* 70* 72*  ?CREATININE 1.31* 1.72* 1.62*  ?CALCIUM 8.8* 8.4* 8.5*  ?PHOS  --   --  5.2*  ? ?Liver Function Tests: ?Recent Labs  ?Lab 04/11/22 ?1141 04/13/22 ?0419  ?AST 19  --   ?ALT 19  --   ?ALKPHOS 164*  --   ?BILITOT 0.4  --   ?PROT 8.0  --   ?ALBUMIN 3.0* 2.8*  ? ?No results for input(s): LIPASE, AMYLASE in the last 168 hours. ?CBC: ?Recent Labs  ?Lab 04/11/22 ?5329 04/13/22 ?0419  ?WBC 11.9* 9.4  ?NEUTROABS 6.0  --   ?HGB 12.5 11.2*  ?HCT 37.9 34.7*  ?MCV 90.5 91.3  ?PLT 458* 377  ? ?Blood Culture ?   ?Component Value Date/Time  ? SDES URINE, CATHETERIZED 10/18/2021 0228  ? [place] NONE 10/18/2021 0228  ? CULT  10/18/2021 0228  ?  NO GROWTH ?Performed at [facility], [place] [address] [place], [place] [address] ?  ? REPTSTATUS 10/19/2021 FINAL 10/18/2021 0228  ? ? ?Cardiac Enzymes: ?No results for input(s): CKTOTAL, CKMB,  CKMBINDEX, TROPONINI in the last 168 hours. ?CBG: ?Recent Labs  ?Lab 04/12/22 ?0714 04/12/22 ?1108 04/12/22 ?1619 04/12/22 ?2209 04/13/22 ?8341  ?GLUCAP 360* 366* 339* 369* 305*  ? ?Iron Studies: No results for input(s): IRON, TIBC, TRANSFERRIN, FERRITIN in the last 72 hours. ?'@lablastinr3'$ @ ?Studies/Results: ?No results found. ?Medications: ? ondansetron (ZOFRAN) IV 8 mg (04/12/22 1635)  ? ? aspirin EC  81 mg Oral Daily  ? doxycycline  100 mg Oral BID  ? DULoxetine  60 mg Oral Daily  ? enoxaparin (LOVENOX) injection  80 mg Subcutaneous Q24H  ? furosemide  80 mg Intravenous BID  ? gabapentin  1,200 mg Oral TID  ? insulin aspart  0-20 Units Subcutaneous TID WC  ? insulin aspart  15 Units Subcutaneous TID WC  ? insulin glargine-yfgn  40 Units Subcutaneous BID  ? loratadine  10 mg Oral Daily  ? prednisoLONE acetate  1 drop Both Eyes QID  ? ? ?Assessment/Plan ?  ?**AHRF - on PRN O2 qhs at home, was requiring 4L [place] but appears ok on RA as of last PM  CTA unrevealing.  Exam difficult but she subjectively reports orthopnea and weight gain which improved with diuresis.  She feels back to baseline - will d/c on higher dose of torsemide to prevent recurrence.   ?  ?**AKI on CKD 3a:  Baseline Cr recently in the 1.3-1.'4mg'$ /dL range and has h/o frequent AKIs.  Was 1.3 on presentation and 1.'72mg'$ /dL 5/8 after IV contrast.   UA 5/8 not consistent with GN.   Renal US 10/2021 ok, no need to repeat;  5/8 PVR ok.   Noting some improvement in creatinine to 1.'6mg'$ /dL today -- suspect contrast + acute HFpEF.  Diuretics per above.  Will follow daily labs - if stable/improved tomorrow ok for d/c.  ?  ?**HFpEF:  per above switch to po diuretics, 2g Na diet.   Has had recurrent vol overload - d/c on higher dose of torsemide ?  ?**Hyponatremia: mild, presumably hypervolemic.  Follow with diuresis.  ?  ?**DM:  poorly controlled. Insulin per primary.  ? ?**morbid obesity ? ?**HTN: BP reasonable ? ?Will continue to follow.  ? ?Jannifer Hick  MD ?04/13/2022, 11:05 AM  ?[facility] ?Pager: [phone] ? ? ?

## 2022-04-13 NOTE — Progress Notes (Signed)
?PROGRESS NOTE ? ? ? ?[name]  YKZ:[id number] DOB: [birth year] DOA: 04/11/2022 ?PCP: [name], MD  ? ?Brief Narrative: ?46 year old past medical history of left foot osteomyelitis, left BKA, OSA, obesity hypoventilation syndrome, asthma, COPD, bipolar disorder, chronic pain syndrome, depression, type 2 diabetes, rectal fissure, history of SVT, urinary incontinence, presented with worsening shortness of breath, fluid retention and orthopnea.  Reported initial oxygen saturation 88 to 90% on 2 L.  She also reported left-sided sharp pain. ? ?Evaluation in the ED CT angio negative for PE.  Chest x-ray low lung volumes without radiographic evidence of acute pulmonary disease.  BNP not significantly elevated. ? ? ?Assessment & Plan: ?  ?Principal Problem: ?  Acute on chronic respiratory failure with hypoxia ([place]) ?Active Problems: ?  Insulin dependent type 2 diabetes mellitus ([place]) ?  Leukocytosis ?  Chronic kidney disease, stage 3a ([place]) ?  Diabetic ulcer of right foot ([place]) ?  Obesity hypoventilation syndrome ([place]) ?  OSA (obstructive sleep apnea) ?  Hypertension ?  Asthma, chronic ?  GERD ?  HLD (hyperlipidemia) ?  Diabetic neuropathy ([place]) ?  Diastolic HF (heart failure) ([place]) ?  Thrombocytosis ? ?1-Acute hypoxic respiratory failure: Secondary to acute on chronic diastolic Heart failure exacerbation.  ?Multifactorial related to her hypoventilation syndrome, initially there was concern for acute diastolic heart failure exacerbation although chest x-ray negative for pulmonary edema, BNP not elevated. She report increased volume.  ?-Monitor oxygen saturation ?-Improvement on diuresis.  ?-Received IV lasix yesterday.  ?-plan to monitor on Torsemide.  ? ?2-Acute on chronic diastolic heart failure ?Continue with CPAP ?Received a  dose of IV Lasix in the ED ?Nephrology consulted for further diuretic.  Her creatinine has increased to 1.7 ?Received one dose IV lasix 5/08. Feels better.  ?Plan to monitor on  Torsemide.  ? ?3-Obesity hypoventilation syndrome ?Continue with BiPAP ? ?Asthma chronic: Continue with Symbicort ? ?AKI on CKD stage III A ?Baseline 1.3--1.4 ?Creatinine  increased to 1.7.  Status post contrast.  On IV Lasix.  Nephrology has been consulted ?Related to contrast plus Acute diastolic HF exacerbation.  ? ?Chest pain: CT chest negative for PE ?Suspect related to hypoxemia ?Resolved.  ? ?Insulin-dependent Diabetes type 2 ?Continue with Semeglee increase dose to 45 BID>  ?Increase meal coverage to 20 units.  ? ?Thrombocytosis. : Reactive ? ?Leukocytosis: resolved.  ? ?Diabetic ulcer right foot: ?She need to follow-up with ID for further recommendation of oral regimen for her fungal infection ?Continue with Doxy ? ?Diabetic  neuropathy: Continue with gabapentin, duloxetine, Percocet as needed ? ? ?Estimated body mass index is 63.15 kg/m? as calculated from the following: ?  Height as of this encounter: '5\' 2"'$  (1.575 m). ?  Weight as of this encounter: 156.6 kg. ? ? ?DVT prophylaxis: Lovenox ?Code Status: Full code ?Family Communication: care discussed with patient.  ?Disposition Plan:  ?Status is: Observation ?The patient will require care spanning > 2 midnights and should be moved to inpatient because: need diuresis.  ? ? ? ?Consultants:  ?Nephrology  ? ?Procedures:  ?None ? ?Antimicrobials:  ? ? ?Subjective: ?She is feeling better. Denies cough  ? ? ?Objective: ?Vitals:  ? 04/12/22 2213 04/13/22 0653 04/13/22 0659 04/13/22 1157  ?BP: (!) 153/93  (!) 121/59 131/62  ?Pulse: 95  87 93  ?Resp: 16   18  ?Temp: 97.6 ?F (36.4 ?C)  98.2 ?F (36.8 ?C) 97.6 ?F (36.4 ?C)  ?TempSrc: Oral  Oral Oral  ?SpO2: 91%  94% 93%  ?  Weight:  (!) 156.6 kg    ?Height:      ? ? ?Intake/Output Summary (Last 24 hours) at 04/13/2022 1335 ?Last data filed at 04/13/2022 1157 ?Gross per 24 hour  ?Intake 1144.12 ml  ?Output 2200 ml  ?Net -1055.88 ml  ? ? ?Filed Weights  ? 04/11/22 0534 04/12/22 0500 04/13/22 0653  ?Weight: (!) 155.6 kg (!)  155.1 kg (!) 156.6 kg  ? ? ?Examination: ? ?General exam: NAD, Morbid obese.  ?Respiratory system: Distant lung sound ?Cardiovascular system: S 1, S 2 RRR ?Gastrointestinal system: BS present, soft, nt ?Central nervous system: alert ?Extremities: left BKA.  ? ? ?Data Reviewed: I have personally reviewed following labs and imaging studies ? ?CBC: ?Recent Labs  ?Lab 04/11/22 ?4970 04/13/22 ?0419  ?WBC 11.9* 9.4  ?NEUTROABS 6.0  --   ?HGB 12.5 11.2*  ?HCT 37.9 34.7*  ?MCV 90.5 91.3  ?PLT 458* 377  ? ? ?Basic Metabolic Panel: ?Recent Labs  ?Lab 04/11/22 ?2637 04/12/22 ?8588 04/13/22 ?0419  ?NA 135 134* 132*  ?K 4.1 4.5 4.3  ?CL 93* 93* 94*  ?CO2 '31 30 28  '$ ?GLUCOSE 362* 366* 330*  ?BUN 61* 70* 72*  ?CREATININE 1.31* 1.72* 1.62*  ?CALCIUM 8.8* 8.4* 8.5*  ?PHOS  --   --  5.2*  ? ? ?GFR: ?Estimated Creatinine Clearance: 64.9 mL/min (A) (by C-G formula based on SCr of 1.62 mg/dL (H)). ?Liver Function Tests: ?Recent Labs  ?Lab 04/11/22 ?1141 04/13/22 ?0419  ?AST 19  --   ?ALT 19  --   ?ALKPHOS 164*  --   ?BILITOT 0.4  --   ?PROT 8.0  --   ?ALBUMIN 3.0* 2.8*  ? ? ?No results for input(s): LIPASE, AMYLASE in the last 168 hours. ?No results for input(s): AMMONIA in the last 168 hours. ?Coagulation Profile: ?No results for input(s): INR, PROTIME in the last 168 hours. ?Cardiac Enzymes: ?No results for input(s): CKTOTAL, CKMB, CKMBINDEX, TROPONINI in the last 168 hours. ?BNP (last 3 results) ?No results for input(s): PROBNP in the last 8760 hours. ?HbA1C: ?No results for input(s): HGBA1C in the last 72 hours. ?CBG: ?Recent Labs  ?Lab 04/12/22 ?1108 04/12/22 ?1619 04/12/22 ?2209 04/13/22 ?5027 04/13/22 ?1119  ?GLUCAP 366* 339* 369* 305* 327*  ? ? ?Lipid Profile: ?No results for input(s): CHOL, HDL, LDLCALC, TRIG, CHOLHDL, LDLDIRECT in the last 72 hours. ?Thyroid Function Tests: ?No results for input(s): TSH, T4TOTAL, FREET4, T3FREE, THYROIDAB in the last 72 hours. ?Anemia Panel: ?No results for input(s): VITAMINB12, FOLATE,  FERRITIN, TIBC, IRON, RETICCTPCT in the last 72 hours. ?Sepsis Labs: ?No results for input(s): PROCALCITON, LATICACIDVEN in the last 168 hours. ? ?No results found for this or any previous visit (from the past 240 hour(s)).  ? ? ? ? ? ?Radiology Studies: ?No results found. ? ? ? ? ? ?Scheduled Meds: ? aspirin EC  81 mg Oral Daily  ? doxycycline  100 mg Oral BID  ? DULoxetine  60 mg Oral Daily  ? enoxaparin (LOVENOX) injection  80 mg Subcutaneous Q24H  ? gabapentin  1,200 mg Oral TID  ? insulin aspart  0-20 Units Subcutaneous TID WC  ? insulin aspart  15 Units Subcutaneous TID WC  ? insulin glargine-yfgn  40 Units Subcutaneous BID  ? loratadine  10 mg Oral Daily  ? prednisoLONE acetate  1 drop Both Eyes QID  ? torsemide  80 mg Oral BID  ? ?Continuous Infusions: ? ondansetron [facility]) IV 8 mg (04/12/22 1635)  ? ? ?  LOS: 1 day  ? ? ?Time spent: 35 minutes ? ? ? ?Elmarie Shiley, MD ?[facility] ? ? ?If 7PM-7AM, please contact night-coverage ?[email or web address] ? ?04/13/2022, 1:35 PM   ?

## 2022-04-13 NOTE — Plan of Care (Signed)
Discussed with patient earlier in the shift plan of care for the evening, pain management and bedtime medications with some teach back displayed. ? ?Problem: Education: ?Goal: Ability to demonstrate management of disease process will improve ?Outcome: Progressing ?  ?

## 2022-04-14 ENCOUNTER — Other Ambulatory Visit: Payer: Self-pay | Admitting: Family Medicine

## 2022-04-14 DIAGNOSIS — J9621 Acute and chronic respiratory failure with hypoxia: Secondary | ICD-10-CM | POA: Diagnosis not present

## 2022-04-14 LAB — RENAL FUNCTION PANEL
Albumin: 2.8 g/dL — ABNORMAL LOW (ref 3.5–5.0)
Anion gap: 12 (ref 5–15)
BUN: 75 mg/dL — ABNORMAL HIGH (ref 6–20)
CO2: 25 mmol/L (ref 22–32)
Calcium: 8.4 mg/dL — ABNORMAL LOW (ref 8.9–10.3)
Chloride: 97 mmol/L — ABNORMAL LOW (ref 98–111)
Creatinine, Ser: 1.55 mg/dL — ABNORMAL HIGH (ref 0.44–1.00)
GFR, Estimated: 42 mL/min — ABNORMAL LOW (ref >60)
Glucose, Bld: 239 mg/dL — ABNORMAL HIGH (ref 70–99)
Phosphorus: 3.9 mg/dL (ref 2.5–4.6)
Potassium: 4.2 mmol/L (ref 3.5–5.1)
Sodium: 134 mmol/L — ABNORMAL LOW (ref 135–145)

## 2022-04-14 LAB — GLUCOSE, CAPILLARY
Glucose-Capillary: 230 mg/dL — ABNORMAL HIGH (ref 70–99)
Glucose-Capillary: 319 mg/dL — ABNORMAL HIGH (ref 70–99)

## 2022-04-14 MED ORDER — TORSEMIDE 20 MG PO TABS: ORAL_TABLET | ORAL | 2 refills | Status: DC

## 2022-04-14 NOTE — TOC Progression Note (Signed)
Transition of Care (TOC) - Progression Note  ? ? ?Patient Details  ?Name: [name] ?MRN: [id number] ?Date of Birth: [birth year] ? ?Transition of Care (TOC) CM/SW Contact  ?Purcell Mouton, RN ?Phone Number: ?04/14/2022, 1:05 PM ? ?Clinical Narrative:    ?Pt states that her mother will come pick her up.  ? ? ?  ?  ? ?Expected Discharge Plan and Services ?  ?  ?  ?  ?  ?Expected Discharge Date: 04/14/22               ?  ?  ?  ?  ?  ?  ?  ?  ?  ?  ? ? ?Social Determinants of Health (SDOH) Interventions ?  ? ?Readmission Risk Interventions ?   ? View : No data to display.  ?  ?  ?  ? ? ?

## 2022-04-14 NOTE — Telephone Encounter (Signed)
Verbal given 

## 2022-04-14 NOTE — Discharge Summary (Signed)
?Discharge Summary ? ?[name] MOQ:[id number] DOB: [birth year] ? ?PCP: Belinda Rio, MD ? ?Admit date: 04/11/2022 ?Discharge date: 04/14/2022 ? ?Time spent: 13mns, case discussed with nephrology prior to discharge ? ?Recommendations for Outpatient Follow-up:  ?F/u with PCP within a week  for hospital discharge follow up, repeat cbc/bmp at follow up ?Nephrology Dr CMarval Regalon 5/19 ? ? ?Discharge Diagnoses:  ?Active Hospital Problems  ? Diagnosis Date Noted  ? Acute on chronic respiratory failure with hypoxia ([place] 04/11/2022  ? Diabetic ulcer of right foot ([place] 12/19/2021  ?  Priority: Low  ? Chronic kidney disease, stage 3a ([place] 10/19/2021  ?  Priority: Low  ? Leukocytosis   ?  Priority: Low  ? Insulin dependent type 2 diabetes mellitus ([place] 05/22/2008  ?  Priority: Low  ? Thrombocytosis 04/11/2022  ? Diastolic HF (heart failure) ([facility] 10/19/2021  ? Diabetic neuropathy ([facility] 07/14/2016  ? HLD (hyperlipidemia) 11/19/2012  ? Obesity hypoventilation syndrome ([place] 05/22/2008  ? Hypertension 05/22/2008  ? GERD 05/22/2008  ? OSA (obstructive sleep apnea) 05/22/2008  ? Asthma, chronic 05/22/2008  ?  ?Resolved Hospital Problems  ?No resolved problems to display.  ? ? ?Discharge Condition: stable ? ?Diet recommendation: heart healthy/carb modified, fluids restriction to 1500cc per 24hrs ? ?Filed Weights  ? 04/12/22 0500 04/13/22 0653 04/14/22 0324  ?Weight: (!) 155.1 kg (!) 156.6 kg (!) 158.4 kg  ? ? ?History of present illness: ( per admitting Md Dr OOlevia Hall ?HPI: Belinda Vanmetreis a 46y.o. female with medical history significant of left foot osteomyelitis, left BKA, OSA, obesity hypoventilation, asthma/COPD, bipolar 2 disorder, right carpal tunnel syndrome, chronic pain syndrome, costochondritis, depression, type II DM, GERD, hyperlipidemia, GERD, hypertension, morbid obesity, peripheral neuropathy, history of pneumonia, rectal fissure, restless leg syndrome, history of SVT, syncopal episode, urinary frequency,  urinary incontinence, varicose veins who was brought to the emergency department via EMS due to progressively worsening dyspnea associated with fluid retention and orthopnea for several days. EMS reported that initial O2 sat duration was 88 to 90% on 2 LPM and they increased to 4 LPM.  She also had a sharp left-sided, sharp, nonradiated chest pain while she was riding in the ambulance, but stated it only lasted 2 to 3 minutes.  She reports diaphoresis was ongoing before the chest pain, but no nausea, emesis, palpitations or lightheadedness.  No fever, sore throat, productive cough, wheezing or hemoptysis.  No abdominal pain, diarrhea, constipation, melena or hematochezia.  No flank pain, dysuria, frequency or hematuria. ?  ?ED course: Initial vital signs were temperature 97.7 ?F, pulse 93, respirations 26, BP 120/86 mmHg and O2 sat 95% on room air.  Patient received furosemide 160 mg IVPB, ondansetron 4 mg IVP and 1 tablet of Percocet 5/325 mg. ?  ?Lab work: CBC showed a white count 11.9, hemoglobin 12.5 g/dL platelets 458.  BNP 30.1 pg/mL.  Troponin was 22 and then 21 ng/L.  Venous blood gas showed a increased PO2 of 58 mmHg, bicarbonate of 34.7 and acid-base excess of 8.0 mmol/L.  The rest of the VBG was normal.  BMP showed normal sodium, potassium and CO2.  Chloride was 93 mmol/L.  Glucose [name] 62, BUN 61, creatinine 1.31 and calcium 8.8 mg/dL. ?  ?Imaging: Portable 1 view chest radiograph showed low lung volumes without radiographic evidence of acute pulmonary disease.  CTA chest with aortic atherosclerosis but no evidence of PE. ?  ? ?Hospital Course:  ?Principal Problem: ?  Acute on chronic respiratory failure with hypoxia (  [place]) ?Active Problems: ?  Insulin dependent type 2 diabetes mellitus ([place]) ?  Leukocytosis ?  Chronic kidney disease, stage 3a ([place]) ?  Diabetic ulcer of right foot ([place]) ?  Obesity hypoventilation syndrome ([place]) ?  OSA (obstructive sleep apnea) ?  Hypertension ?  Asthma, chronic ?   GERD ?  HLD (hyperlipidemia) ?  Diabetic neuropathy ([place]) ?  Diastolic HF (heart failure) ([place]) ?  Thrombocytosis ? ? ?Assessment and Plan: ? ? ?Acute hypoxic respiratory failure likely due to acute on chronic diastolic CHF ?-CTA no PE ?-she received iv lasix initially, this was transitioned to oral torsemide '80mg'$  bid, she diuresed well, feeling better, sob resolved, she is now on room air.  ?-she is discharged home on torsemide '80mg'$  in am and '40mg'$  in pm ( this is a increase from PTA torsemide 80 mg daily) ? ?AKI on CKD3 ?Seen by nephrology,  ?Thought due to contrast ( CTA on presentation ) and acute heart failure, renal function slowly improving, she is cleared to discharge home by nephrology, she is to f/u with nephrology Dr.Coladonato on May 19 ? ? ?Hyponatremia, mild in the setting of volume overload and also component of pseudohyponatremia due to elevated blood glucose ?Improving, f/u with pcp ? ?Insulin-dependent type 2 diabetes, Uncontrolled with hyperglycemia ?Follow-up with PCP and endocrinology  ? ?Diabetic ulcer right foot: ?Reports has appointment with [facility] on 5/16 ?Continue with Doxy ? ?Diabetic  neuropathy: Continue with gabapentin, duloxetine, Percocet as needed ? ? ?Body mass index is 63.87 kg/m?.,  Class III obesity with obesity hypoventilation syndrome ?Continue home BiPAP ? ? ?Discharge Exam: ?BP (!) 110/59 (BP Location: Left Arm)   Pulse 84   Temp 98 ?F (36.7 ?Belinda) (Axillary)   Resp 13   Ht '5\' 2"'$  (1.575 m)   Wt (!) 158.4 kg   LMP 04/08/2022   SpO2 95%   BMI 63.87 kg/m?  ? ?General: NAD, pleasant  ?Cardiovascular: RRR ?Respiratory: normal respiratory effort  ? ? ? ?Discharge Instructions   ? ? Diet - low sodium heart healthy   Complete by: As directed ?  ? Carb modified diet,  ?Fluids restriction 1500cc per 24hrs  ? Discharge wound care:   Complete by: As directed ?  ? Place saline moistened gauze right foot, cover with dry gauze, tape in place  ? Increase activity slowly   Complete by:  As directed ?  ? ?  ? ?Allergies as of 04/14/2022   ? ?   Reactions  ? Cefepime Other (See Comments)  ? AKI, see records from [place] hospitalization in January 2020.   ?Other reaction(s): Other (See Comments) ?"Shut down organs/kidneys" ?Note pt has tolerated Rocephin and Keflex  ? Iodine Other (See Comments)  ? Kidney dysfunction  ? Kiwi Extract Shortness Of Breath, Swelling, Anaphylaxis  ? Morphine And Related Nausea And Vomiting  ? Nalbuphine   ? Other reaction(s): Hallucinations, Other (See Comments) ?"FEELS LIKE SOMETHING CRAWLING ON ME" ?Reaction:  Nervousness ?"FEELS LIKE SOMETHING CRAWLING ON ME" ?Reaction:  Nervousness ?Reaction:  Nervousness ?"FEELS LIKE SOMETHING CRAWLING ON ME"  ? Trental [pentoxifylline] Nausea And Vomiting  ? Toradol [ketorolac Tromethamine] Other (See Comments)  ? Feels like something is crawling on me  ? Morphine Nausea And Vomiting  ? Nubain [nalbuphine Hcl] Other (See Comments)  ? "FEELS LIKE SOMETHING CRAWLING ON ME"  ? ?  ? ?  ?Medication List  ?  ? ?TAKE these medications   ? ?Accu-Chek Guide test strip ?Generic drug: glucose  blood ?USE T0 CHECK BLOOD SUGAR 3 TIMES DAILY ?  ?Accu-Chek Softclix Lancets lancets ?Use to check blood glucose four times daily ?  ?Accu-Chek FastClix Lancets Misc ?USE DAILY AS INSTRUCTED ?  ?albuterol 108 (90 Base) MCG/ACT inhaler ?Commonly known as: VENTOLIN HFA ?INHALE 1 TO 2 PUFFS BY MOUTH EVERY SIX HOURS AS NEEDED FOR WHEEZING OR SHORTNESS OF BREATH ?What changed: See the new instructions. ?  ?aspirin EC 81 MG tablet ?Take 1 tablet (81 mg total) by mouth daily. Swallow whole. ?  ?Baqsimi One Pack 3 MG/DOSE Powd ?Generic drug: Glucagon ?Place 3 mg into the nose as needed (low blood sugar). ?  ?cetirizine 10 MG tablet ?Commonly known as: ZYRTEC ?Take 1 tablet (10 mg total) by mouth daily as needed for allergies. ?  ?Cholecalciferol 25 MCG (1000 UT) tablet ?Take 1 tablet (1,000 Units total) by mouth daily. ?  ?Dexcom G6 Sensor Misc ?Inject 1 applicator  into the skin as directed. Change sensor every 10 days. ?  ?Dexcom G6 Transmitter Misc ?Inject 1 Device into the skin as directed. Reuse 8 times with sensor changes. ?  ?doxycycline 100 MG tablet ?Commonly known as: V

## 2022-04-14 NOTE — Plan of Care (Signed)
?  Problem: Education: ?Goal: Ability to demonstrate management of disease process will improve ?Outcome: Adequate for Discharge ?Goal: Ability to verbalize understanding of medication therapies will improve ?Outcome: Adequate for Discharge ?Goal: Individualized Educational Video(s) ?Outcome: Adequate for Discharge ?  ?Problem: Activity: ?Goal: Capacity to carry out activities will improve ?Outcome: Adequate for Discharge ?  ?Problem: Cardiac: ?Goal: Ability to achieve and maintain adequate cardiopulmonary perfusion will improve ?Outcome: Adequate for Discharge ?  ?Problem: Education: ?Goal: Ability to demonstrate management of disease process will improve ?Outcome: Adequate for Discharge ?Goal: Ability to verbalize understanding of medication therapies will improve ?Outcome: Adequate for Discharge ?Goal: Individualized Educational Video(s) ?Outcome: Adequate for Discharge ?  ?Problem: Activity: ?Goal: Capacity to carry out activities will improve ?Outcome: Adequate for Discharge ?  ?Problem: Cardiac: ?Goal: Ability to achieve and maintain adequate cardiopulmonary perfusion will improve ?Outcome: Adequate for Discharge ?  ?Problem: Education: ?Goal: Knowledge of General Education information will improve ?Description: Including pain rating scale, medication(s)/side effects and non-pharmacologic comfort measures ?Outcome: Adequate for Discharge ?  ?Problem: Activity: ?Goal: Risk for activity intolerance will decrease ?Outcome: Adequate for Discharge ?  ?Problem: Pain Managment: ?Goal: General experience of comfort will improve ?Outcome: Adequate for Discharge ?  ?Problem: Safety: ?Goal: Ability to remain free from injury will improve ?Outcome: Adequate for Discharge ?  ?Problem: Skin Integrity: ?Goal: Risk for impaired skin integrity will decrease ?Outcome: Adequate for Discharge ?  ?

## 2022-04-14 NOTE — Progress Notes (Signed)
PT Cancellation Note ? ?Patient Details ?Name: [name] ?MRN: [id number] ?DOB: [birth year] ? ? ?Cancelled Treatment:    Reason Eval/Treat Not Completed: Patient declined, no reason specified;Other (comment) (Pt resting in bed on BiPAP and reported she wants to rest. PT reports she feels she is at baseline but does not have her prosthesis to attempt mobility. She is hoping to leave today when family brings her leg to her.)Will follow up at later date/time as schedlue allows if pt remains in acute setting and when she has her prosthesis. ? ? ?Gwynneth Albright PT, DPT ?Acute Rehabilitation Services ?Office [phone] ?Pager [phone]  ?04/14/2022, 1:41 PM ?

## 2022-04-14 NOTE — Telephone Encounter (Signed)
Apologies for the delay! ?Yes this is totally fine to give the verbal okay. ? ?Thank you, ?[name], MD  ?

## 2022-04-14 NOTE — Progress Notes (Signed)
? [facility] ?Progress Note  ? ?Subjective:   Feels much improved after IV diuresis and feels back to baseline.  I/Os yest 1.2 / 3.4L all UOP.  Ready for d/c today.  ? ?Objective ?Vitals:  ? 04/13/22 1157 04/13/22 2107 04/14/22 0324 04/14/22 0700  ?BP: 131/62 134/73  (!) 110/59  ?Pulse: 93 96  84  ?Resp: '18 16  13  '$ ?Temp: 97.6 ?F (36.4 ?C) 97.7 ?F (36.5 ?C)  98 ?F (36.7 ?C)  ?TempSrc: Oral Oral  Axillary  ?SpO2: 93% 98%  95%  ?Weight:   (!) 158.4 kg   ?Height:      ? ?Physical Exam ?Gen: obese woman sitting up on edge of bed - comfortable ?Eyes: anicteric ?ENT: MMM ?Neck: thick, can't assess JVD ?CV: RRR ?Abd: soft, nontender ?Lungs: BS distant - can't appreciate any rales ?GU: no foley ?Extr:  L BKA, R TMA, compression stockings on ?Neuro:  conversant ?Skin: no rashes noted  ? ?Additional Objective ?Labs: ?Basic Metabolic Panel: ?Recent Labs  ?Lab 04/12/22 ?9892 04/13/22 ?1194 04/14/22 ?0414  ?NA 134* 132* 134*  ?K 4.5 4.3 4.2  ?CL 93* 94* 97*  ?CO2 '30 28 25  '$ ?GLUCOSE 366* 330* 239*  ?BUN 70* 72* 75*  ?CREATININE 1.72* 1.62* 1.55*  ?CALCIUM 8.4* 8.5* 8.4*  ?PHOS  --  5.2* 3.9  ? ? ?Liver Function Tests: ?Recent Labs  ?Lab 04/11/22 ?1141 04/13/22 ?0419 04/14/22 ?0414  ?AST 19  --   --   ?ALT 19  --   --   ?ALKPHOS 164*  --   --   ?BILITOT 0.4  --   --   ?PROT 8.0  --   --   ?ALBUMIN 3.0* 2.8* 2.8*  ? ? ?No results for input(s): LIPASE, AMYLASE in the last 168 hours. ?CBC: ?Recent Labs  ?Lab 04/11/22 ?1740 04/13/22 ?0419  ?WBC 11.9* 9.4  ?NEUTROABS 6.0  --   ?HGB 12.5 11.2*  ?HCT 37.9 34.7*  ?MCV 90.5 91.3  ?PLT 458* 377  ? ? ?Blood Culture ?   ?Component Value Date/Time  ? SDES URINE, CATHETERIZED 10/18/2021 0228  ? [place] NONE 10/18/2021 0228  ? CULT  10/18/2021 0228  ?  NO GROWTH ?Performed at [facility], [place] [address] [place], [place] [address] ?  ? REPTSTATUS 10/19/2021 FINAL 10/18/2021 0228  ? ? ?Cardiac Enzymes: ?No results for input(s): CKTOTAL, CKMB, CKMBINDEX, TROPONINI in the  last 168 hours. ?CBG: ?Recent Labs  ?Lab 04/13/22 ?0736 04/13/22 ?1119 04/13/22 ?1609 04/13/22 ?2103 04/14/22 ?1856  ?GLUCAP 305* 327* 377* 322* 230*  ? ? ?Iron Studies: No results for input(s): IRON, TIBC, TRANSFERRIN, FERRITIN in the last 72 hours. ?'@lablastinr3'$ @ ?Studies/Results: ?No results found. ?Medications: ? ondansetron [facility]) IV 8 mg (04/14/22 3149)  ? ? aspirin EC  81 mg Oral Daily  ? doxycycline  100 mg Oral BID  ? DULoxetine  60 mg Oral Daily  ? enoxaparin (LOVENOX) injection  80 mg Subcutaneous Q24H  ? gabapentin  1,200 mg Oral TID  ? insulin aspart  0-20 Units Subcutaneous TID WC  ? insulin aspart  20 Units Subcutaneous TID WC  ? insulin glargine-yfgn  45 Units Subcutaneous BID  ? loratadine  10 mg Oral Daily  ? prednisoLONE acetate  1 drop Both Eyes QID  ? torsemide  80 mg Oral BID  ? ? ?Assessment/Plan ?  ?**AHRF - on PRN O2 qhs at home, was requiring 4L [place] initially here and reported orthopnea and wt gain.  CTA  unrevealing.  Exam difficult but she subjectively reports orthopnea and weight gain which improved with diuresis.  She feels back to baseline - will d/c on higher dose of torsemide to prevent recurrence.   ?  ?**AKI on CKD 3a:  Baseline Cr recently in the 1.3-1.'4mg'$ /dL range and has h/o frequent AKIs.  Was 1.3 on presentation and 1.'72mg'$ /dL 5/8 after IV contrast.   UA 5/8 not consistent with GN.   Renal US 10/2021 ok, no need to repeat;  5/8 PVR ok.   Noting some further improvement in creatinine to 1.'55mg'$ /dL today -- suspect contrast + acute HFpEF.  Diuretics per above.  [place] for d/c today - has f/u with outpt nephrologist Dr. Marval Regal 5/19 already.  ?  ?**HFpEF:  per above switch to po diuretics, 2g Na diet.   Has had recurrent vol overload - d/c on higher dose of torsemide - was on 80 qam, d/c on 80/40.  Low na diet and limit fluids 1532m/day at home discussed. ?  ?**Hyponatremia: mild, hypervolemic.  improved with diuresis ?  ?**DM:  poorly controlled. Insulin per primary.  ? ?**morbid  obesity ? ?**HTN: BP reasonable ? ?OAjofor d/c.  ? ?LJannifer HickMD ?04/14/2022, 11:11 AM  ?[facility] ?Pager: [phone]? ? ?

## 2022-04-14 NOTE — Telephone Encounter (Signed)
[facility] calls nurse line requesting to dispense patients Oxycodone tomorrow 5/11. ? ?Pharmacy reports the fill day is 5/13, however they do not do deliveries on this day.  ? ?If ok I can give them a verbal.  ? ?Please advise.  ?

## 2022-04-14 NOTE — Progress Notes (Signed)
Discharge instructions given to and reviewed with patient.  Patient verbalized understanding.  PIV removed.  Patient escorted to main entrance with belongings via wheelchair for transport home with mother. ? ?Angie Fava, RN  ? ?

## 2022-04-16 ENCOUNTER — Encounter: Payer: Medicaid Other | Admitting: Physical Therapy

## 2022-04-20 ENCOUNTER — Encounter: Payer: Medicaid Other | Admitting: Rehabilitation

## 2022-04-22 ENCOUNTER — Encounter: Payer: Medicaid Other | Admitting: Physical Therapy

## 2022-04-27 ENCOUNTER — Encounter: Payer: Medicaid Other | Admitting: Rehabilitation

## 2022-04-27 ENCOUNTER — Other Ambulatory Visit: Payer: Self-pay

## 2022-04-27 ENCOUNTER — Encounter: Payer: Self-pay | Admitting: Family Medicine

## 2022-04-27 ENCOUNTER — Ambulatory Visit (INDEPENDENT_AMBULATORY_CARE_PROVIDER_SITE_OTHER): Payer: Medicaid Other | Admitting: Family Medicine

## 2022-04-27 VITALS — BP 138/77 | HR 81

## 2022-04-27 DIAGNOSIS — G8929 Other chronic pain: Secondary | ICD-10-CM

## 2022-04-27 DIAGNOSIS — S91309A Unspecified open wound, unspecified foot, initial encounter: Secondary | ICD-10-CM | POA: Diagnosis not present

## 2022-04-27 DIAGNOSIS — Z09 Encounter for follow-up examination after completed treatment for conditions other than malignant neoplasm: Secondary | ICD-10-CM

## 2022-04-27 MED ORDER — OXYCODONE-ACETAMINOPHEN 7.5-325 MG PO TABS: 1.0000 | ORAL_TABLET | Freq: Four times a day (QID) | ORAL | 0 refills | Status: DC | PRN

## 2022-04-27 NOTE — Patient Instructions (Addendum)
It was great to see you again today!  Call wound care center for an appointment [phone]  Refilled pain medications for 2 months   Follow up with me in 1 month   Be well, Dr. [name]

## 2022-04-27 NOTE — Progress Notes (Signed)
  Date of Visit: 04/27/2022   SUBJECTIVE:   HPI:  [name] presents today for routine follow up.  Was hospitalized for dyspnea 5/7-5/10 on [facility] service. Thought to be due to volume overload, discharged on torsemide '80mg'$  am and '40mg'$  in PM. Continues on this regimen and is breathing well.  Seen by nephro in hospital, has follow up in place with them 5/19. AKI thought to be due to contrast from CTA obtained on presentation to hospital. CTA was negative for PE.  Chronic pain - needs refills of the oxycodone-acetaminophen. Stores it safely. Has teenager and has no concerns about teenager using her medication. Denies excessive sedation. Current regimen helps for pain.  Foot ulcer - seen 5/16 by ID team at [facility], had inflammatory markers drawn. Sed rate 48, CRP 3.25 mg/dL. Stopped antibiotics at ID visit, wants to monitor. Is to follow up 4 weeks from that visit. Doing wet gauze packing daily. Would like to see wound care center. Xray at that visit negative for worsening.  OBJECTIVE:   BP 138/77   Pulse 81   LMP 04/08/2022   SpO2 97%  Gen: no acute distress, pleasant cooperative HEENT: normocephalic, atraumatic  Heart: regular rate and rhythm, no murmur Lungs: normal work of breathing, clear to auscultation bilaterally  Neuro: alert, speech normal, grossly nonfocal Ext: 2cm ulcer to distal aspect of R foot (s/p transmetatarsal amputation). No purulence. Completely nontender to palpation (patient has severe diabetic neuropathy). No warmth or drainage. Mild bleeding with removal of packing, resolved with gentle pressure.     ASSESSMENT/PLAN:   Wound of R foot - Provided phone # for wound care center so patient can schedule with them. Inflammatory markers improved at recent visit at ID clinic at [facility]. Continue to monitor.  Chronic pain - stable. Reviewed risks/benefits with patient today. She and I both agree that benefits outweigh risks at this time. PDMP with appropriate  findings. Will refill x2 months.  Hospital follow up - doing better on torsemide, continue.   FOLLOW UP: Follow up in 1 mo for above issues  [name], [place]

## 2022-04-30 ENCOUNTER — Encounter: Payer: Medicaid Other | Admitting: Physical Therapy

## 2022-05-04 ENCOUNTER — Encounter: Payer: Medicaid Other | Admitting: Rehabilitation

## 2022-05-06 ENCOUNTER — Encounter: Payer: Medicaid Other | Admitting: Physical Therapy

## 2022-05-07 NOTE — Telephone Encounter (Signed)
Late entry; forms completed & faxed back to [employer] to be scanned into chart  [name], MD

## 2022-05-09 ENCOUNTER — Other Ambulatory Visit: Payer: Self-pay | Admitting: Family Medicine

## 2022-05-09 ENCOUNTER — Telehealth: Payer: Self-pay | Admitting: Family Medicine

## 2022-05-09 IMAGING — CR DG KNEE COMPLETE 4+V*L*
4 series · 4 of 4 positions shown · non-contrast
Comparison: None.

CLINICAL DATA: Left knee pain after fall

EXAM:
LEFT KNEE - COMPLETE 4+ VIEW

[x knee ap left]
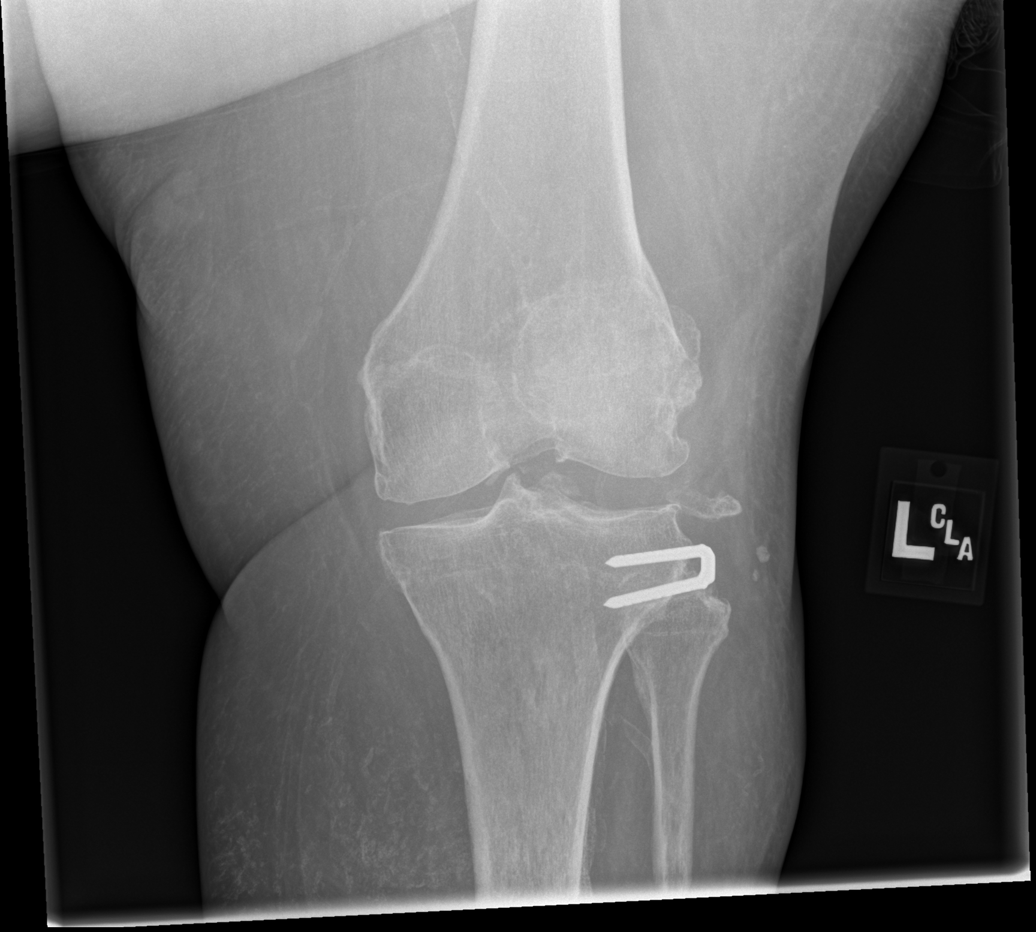

[x knee obl left (1 of 2)]
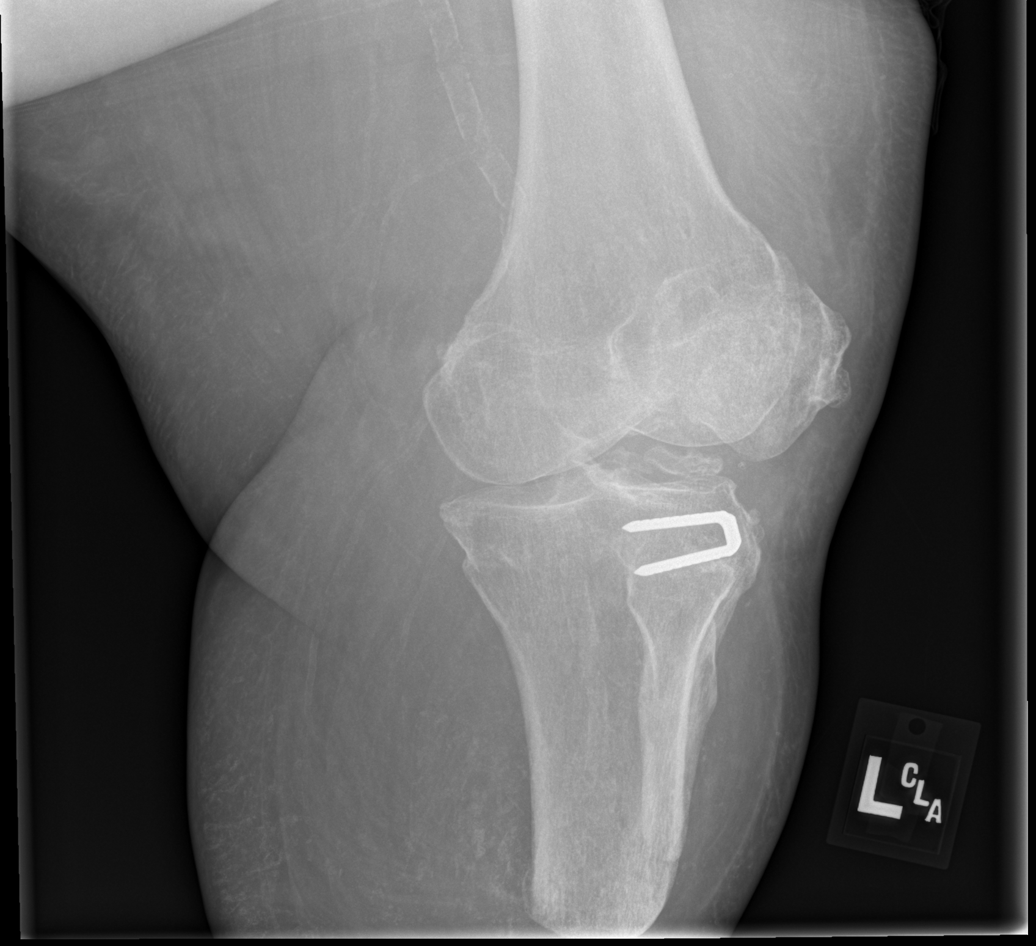

[x knee obl left (2 of 2)]
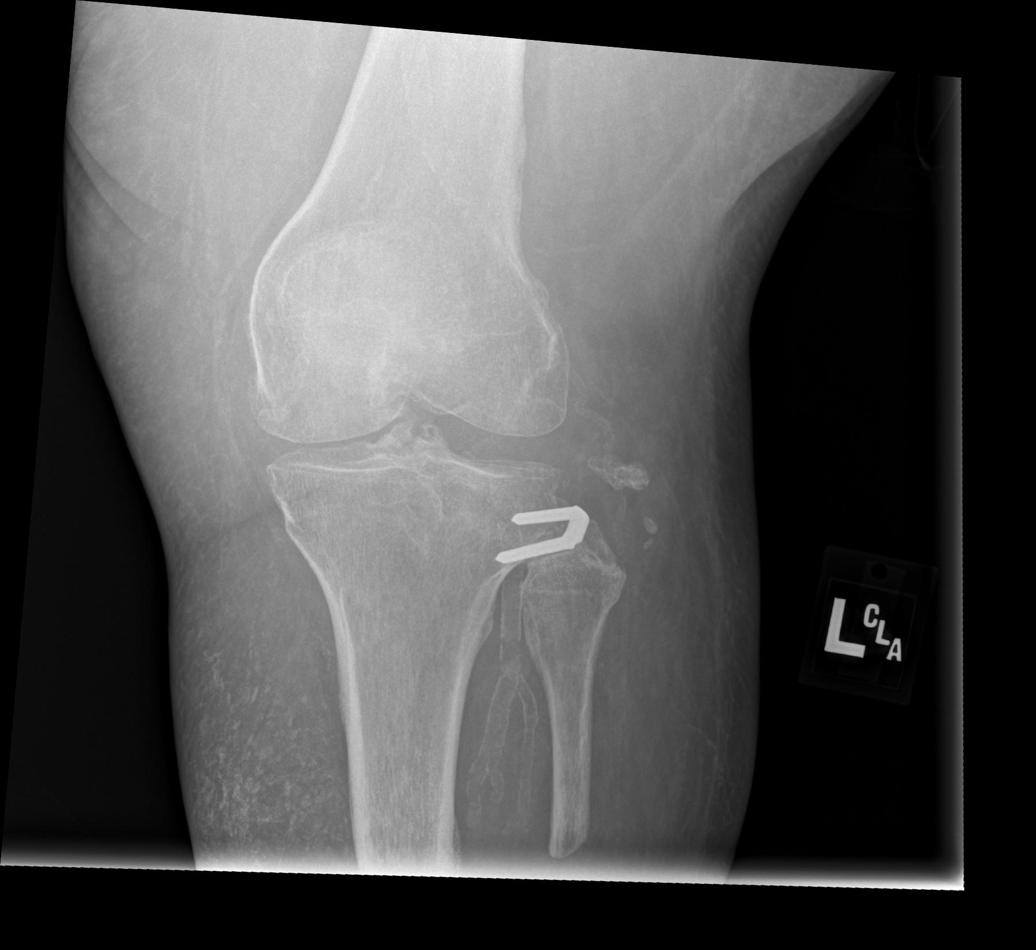

[x knee lat left]
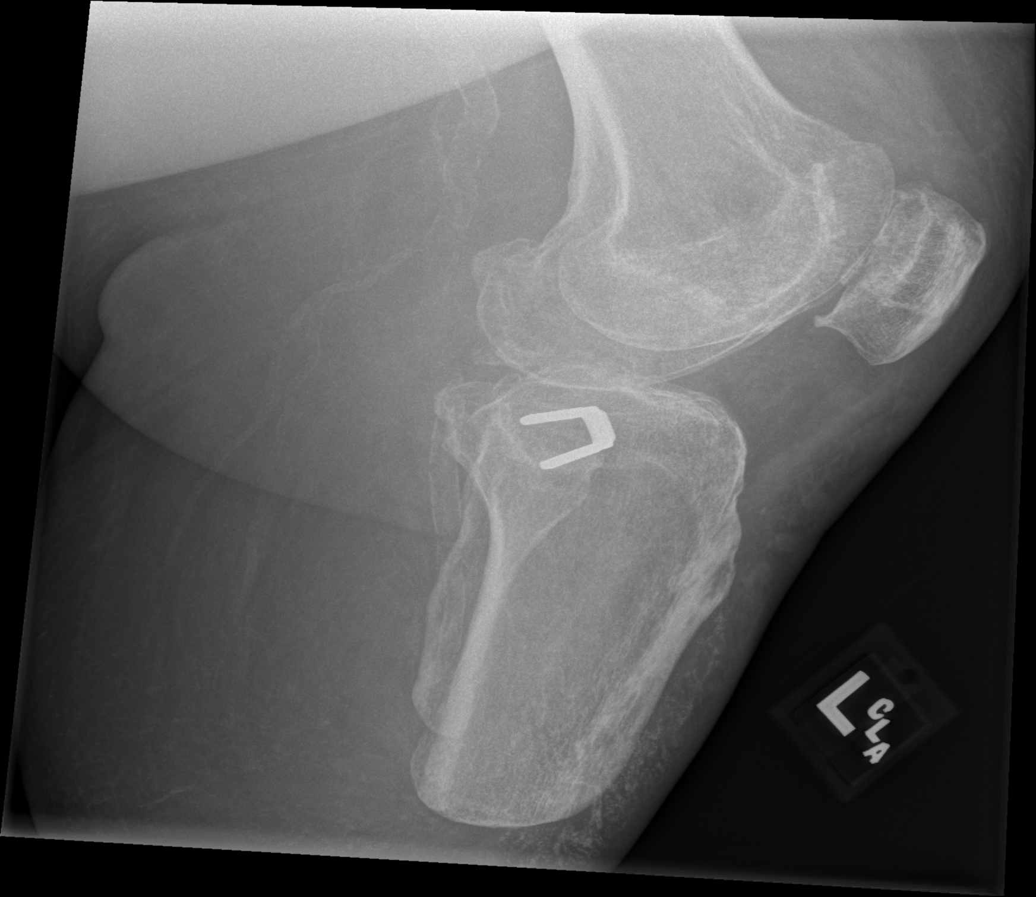

[4 of 4 positions shown; findings below may reference images not displayed]

FINDINGS: Surgical anchor in the lateral left tibia. Status post below-knee
amputation. No acute fracture.
IMPRESSION: No acute fracture or dislocation of the left knee. Status post
below-knee amputation.

## 2022-05-09 IMAGING — CR DG KNEE COMPLETE 4+V*R*
4 series · 4 of 4 positions shown · non-contrast
Comparison: None.

CLINICAL DATA: Knee pain, fall

EXAM:
RIGHT KNEE - COMPLETE 4+ VIEW

[x knee ap right]
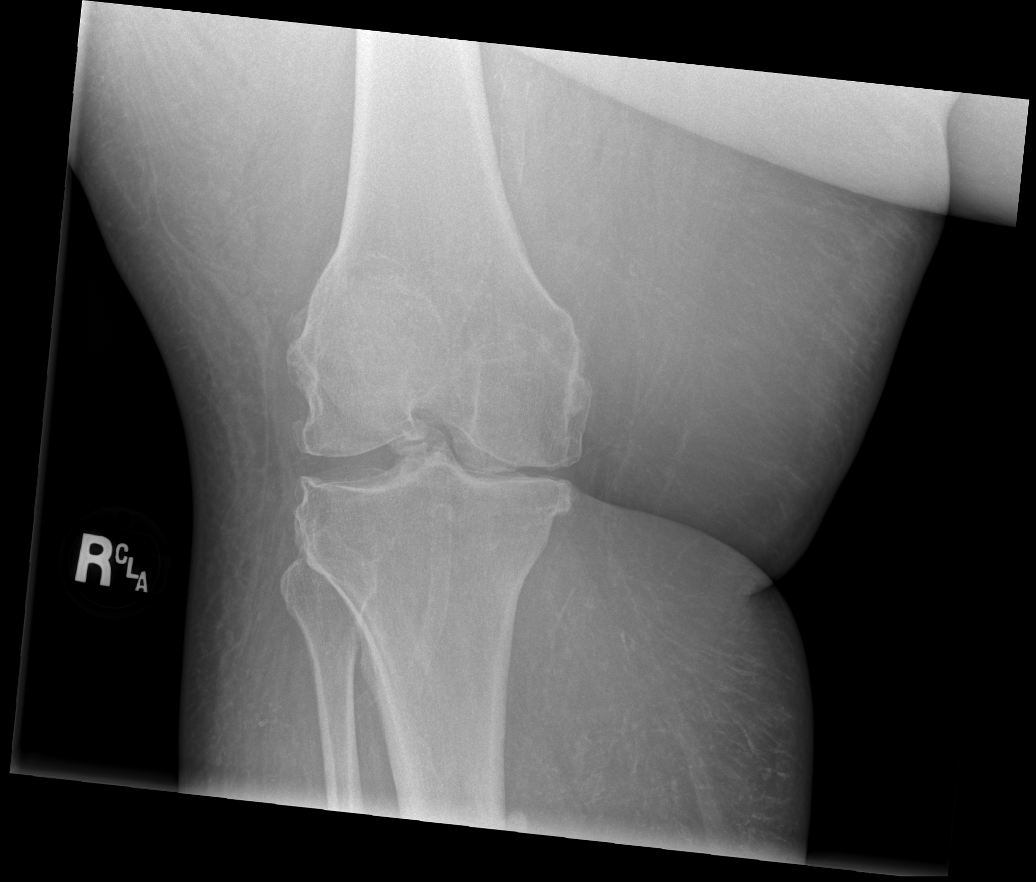

[x knee obl right (1 of 2)]
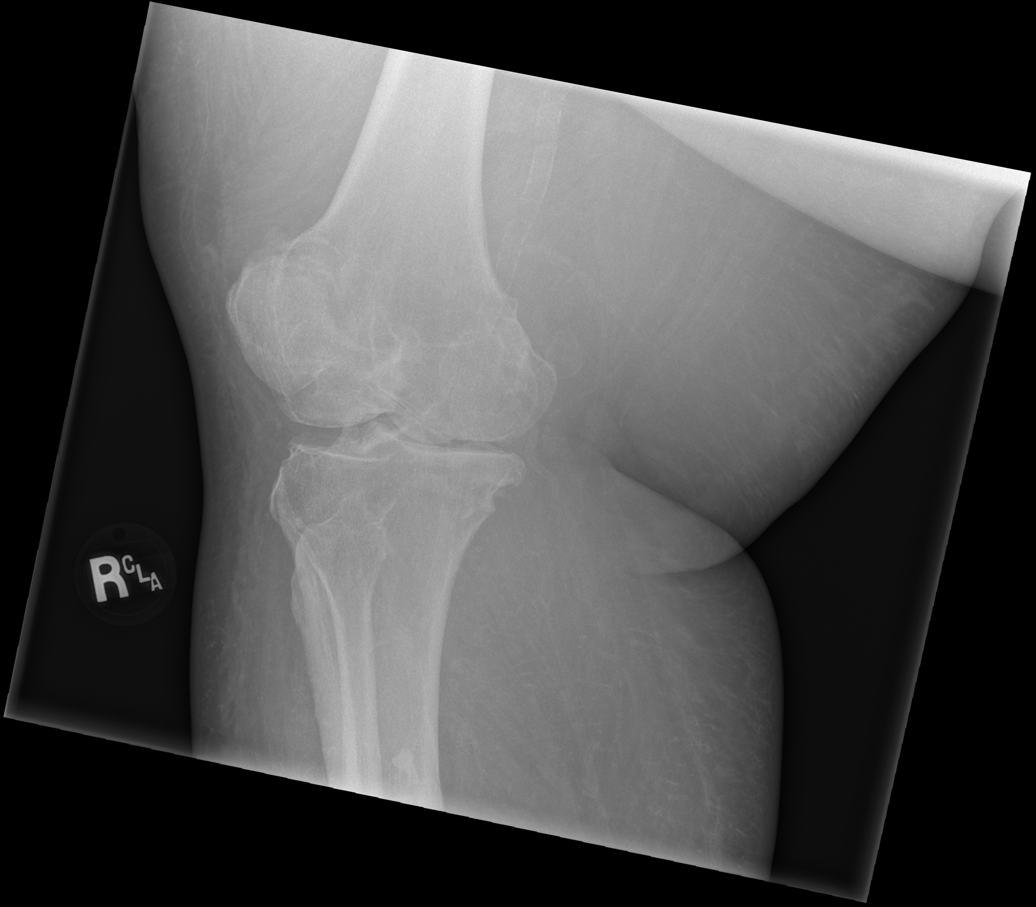

[x knee obl right (2 of 2)]
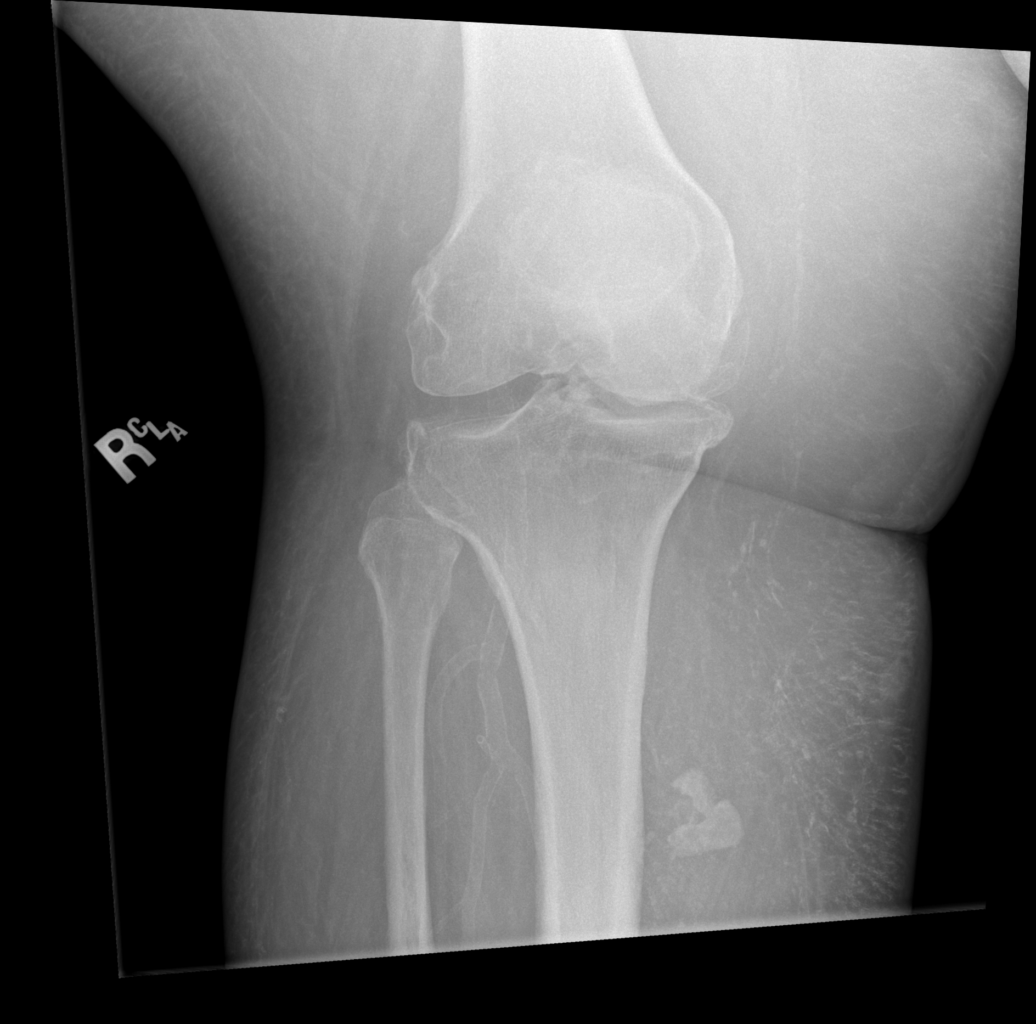

[x knee lat right]
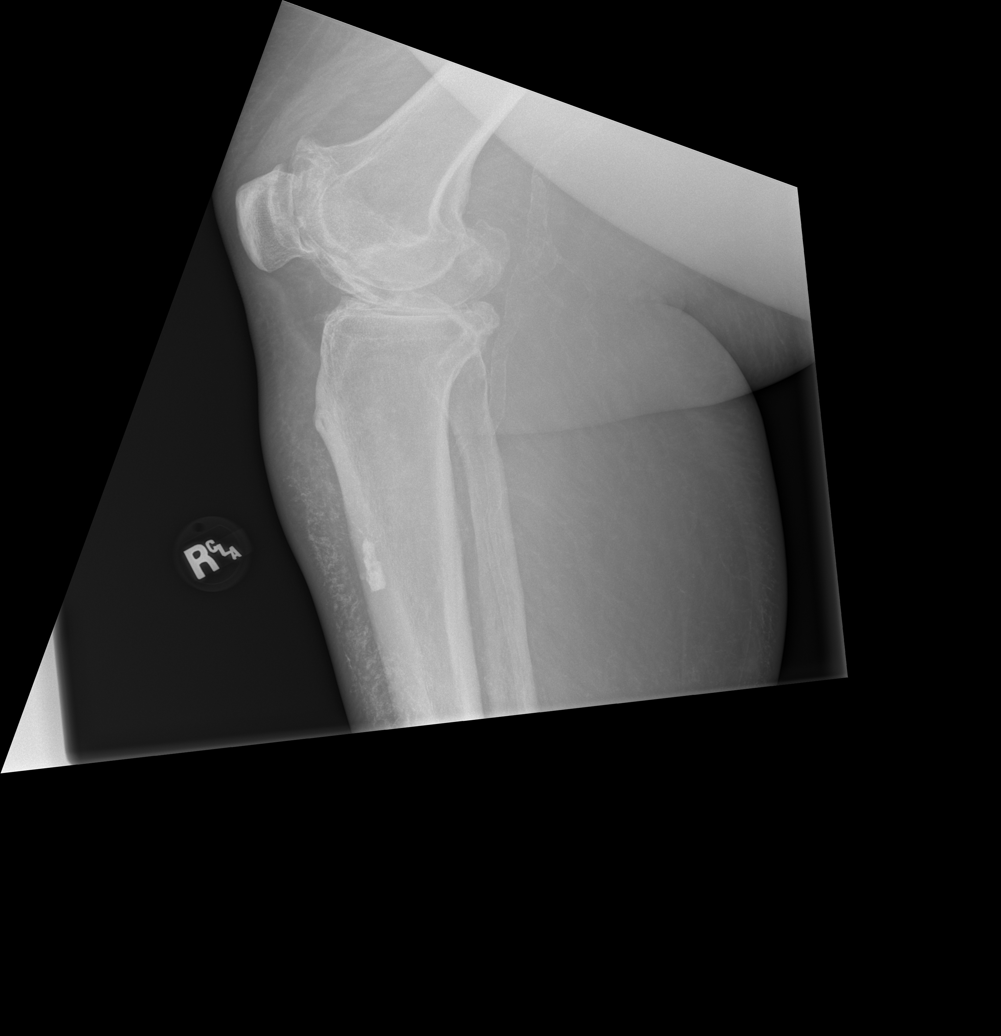

[4 of 4 positions shown; findings below may reference images not displayed]

FINDINGS: No acute fracture or dislocation. There is mild osteoarthrosis. Soft
tissue density anteriorly likely due to venous stasis.
Atherosclerotic calcification. No joint effusion.
IMPRESSION: No acute fracture or dislocation of the right knee. Mild
osteoarthrosis.

## 2022-05-09 NOTE — Telephone Encounter (Signed)
Received call from [facility] line regarding right foot pain. 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 There is an ulcer which is bleeding and hurting badly. Unsure how much blood she has lost but it is all over the bed sheets. She hit her foot on the wall yesterday. Takes oxycodone 7.'5mg'$  4 times a day for chronic pain.  Her daughter packed the ulcer with saline gauze which is not helping stop the pain or the bleeding. Denies purulent discharge.  Patient is not on anticoagulation.   For pain recommended continuing oxycodone and Tylenol as needed.  Recommended that patient can either apply pressure to the area and observe the bleeding to see if it settles down or go to the ER.  Patient thinks that she may go to the ER tonight.  Safety precautions provided to patient who expressed understanding. Will forward to patient's PCP Dr [name].  Lattie Haw MD PGY-3, Family Medicine

## 2022-05-11 ENCOUNTER — Encounter: Payer: Self-pay | Admitting: *Deleted

## 2022-05-13 IMAGING — CT CT RENAL STONE PROTOCOL
2 of 4 series · 16 of 46 positions shown, 18 images · non-contrast
Comparison: CT abdomen and pelvis 12/10/2016. CT pelvis 05/17/2019.
CT chest 12/04/2020

CLINICAL DATA: Right flank pain and pelvic pain starting on
[REDACTED].

EXAM:
CT ABDOMEN AND PELVIS WITHOUT CONTRAST
TECHNIQUE: Multidetector CT imaging of the abdomen and pelvis was performed
following the standard protocol without IV contrast.

[Series 2: axial st · axial · 0.98mm/px · z∈[+1018,+1463]mm · 13 of 101 slices shown, 15 images]
[im 6/101  soft-tissue]
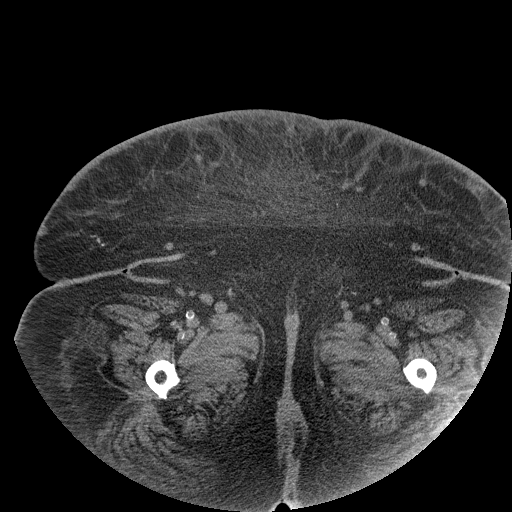
[im 6/101  bone]
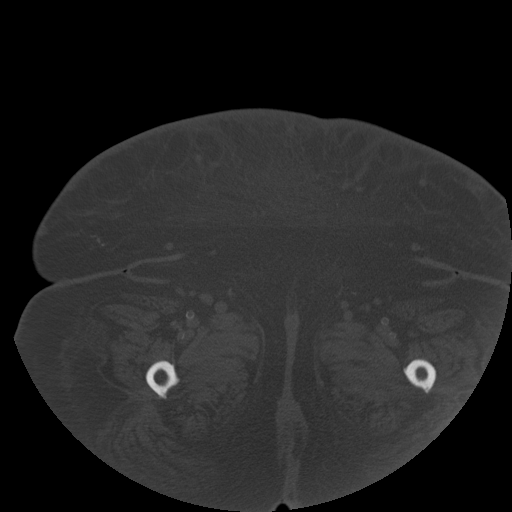
[im 12/101  soft-tissue]
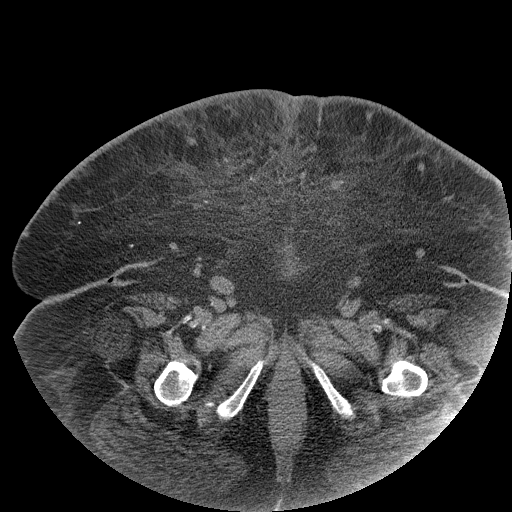
[im 23/101  soft-tissue]
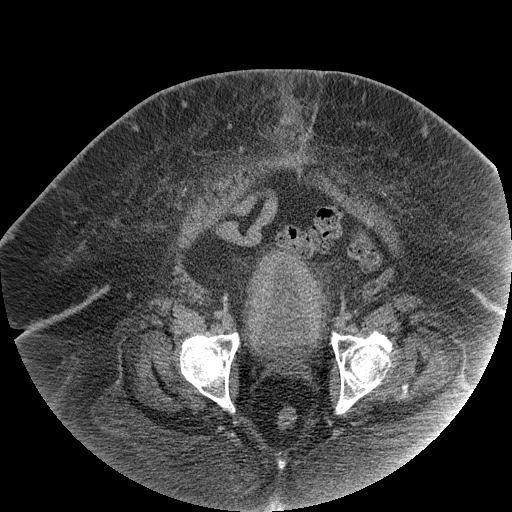
[im 28/101  soft-tissue]
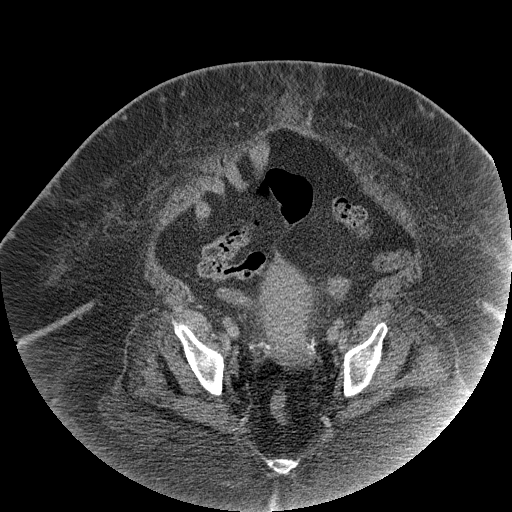
[im 34/101  soft-tissue]
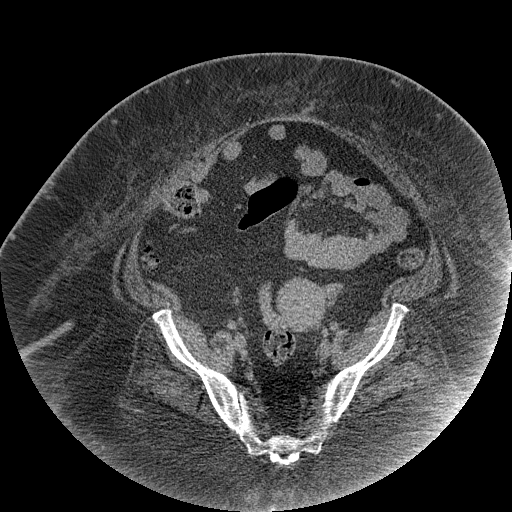
[im 45/101  soft-tissue]
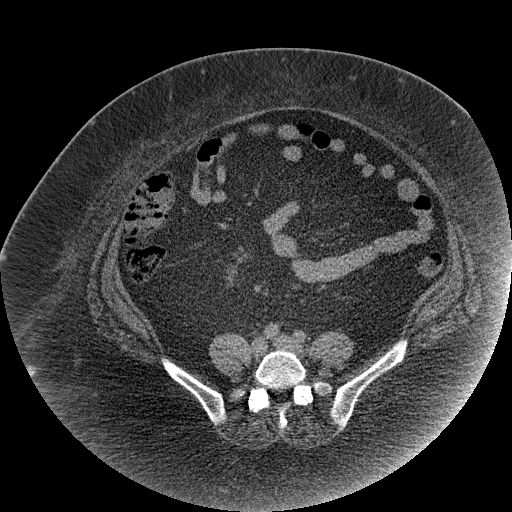
[im 51/101  soft-tissue]
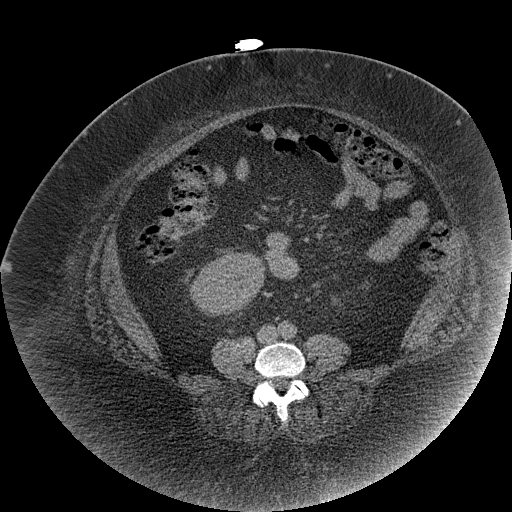
[im 56/101  soft-tissue]
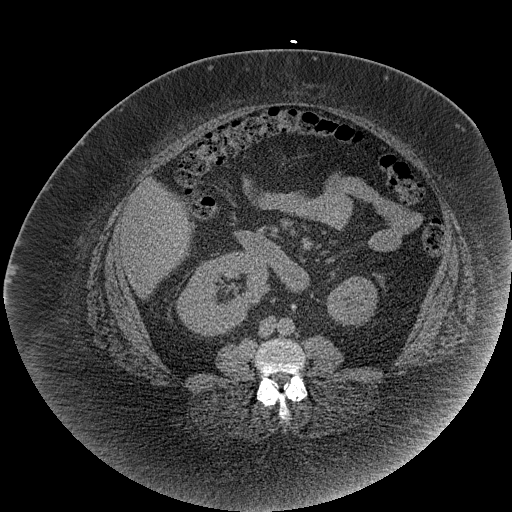
[im 67/101  soft-tissue]
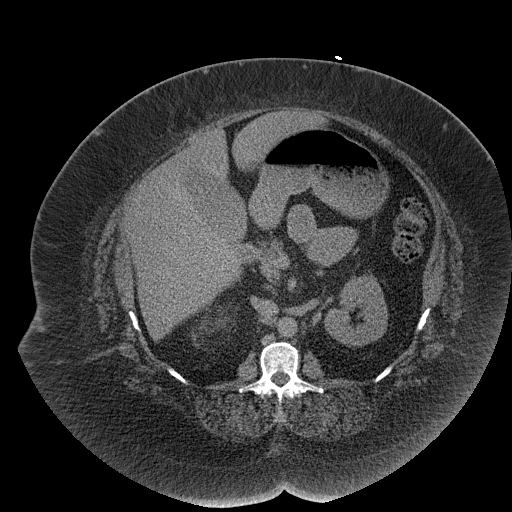
[im 67/101  bone]
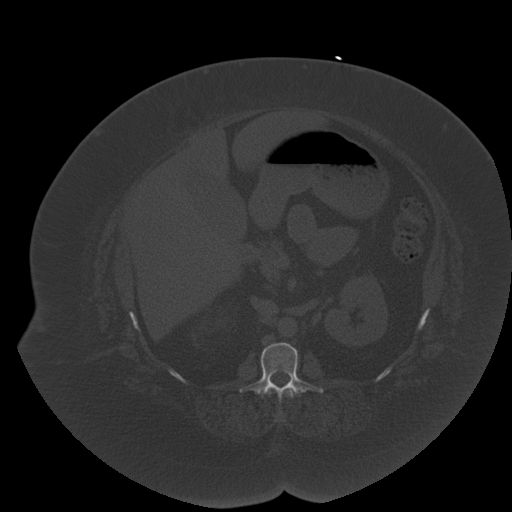
[im 73/101  soft-tissue]
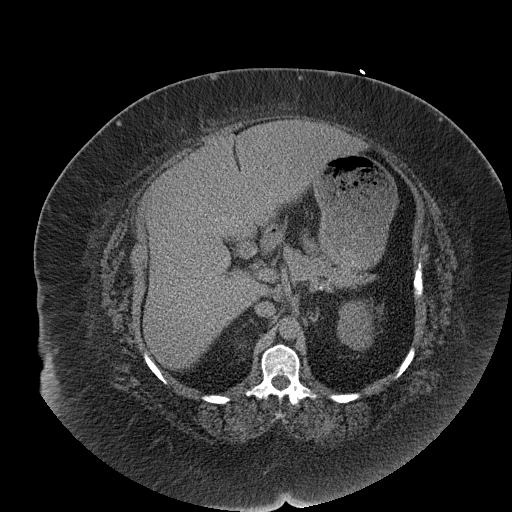
[im 78/101  soft-tissue]
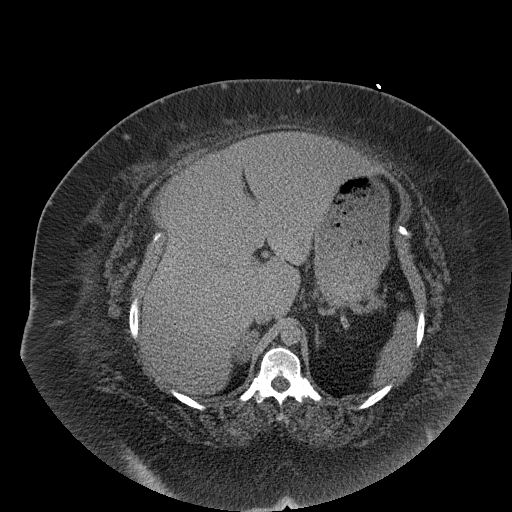
[im 89/101  soft-tissue]
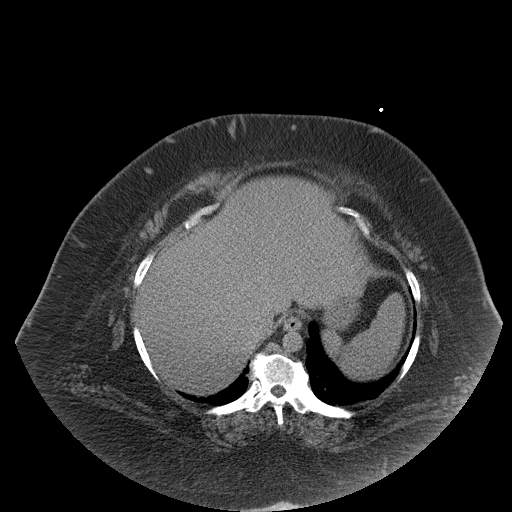
[im 95/101  soft-tissue]
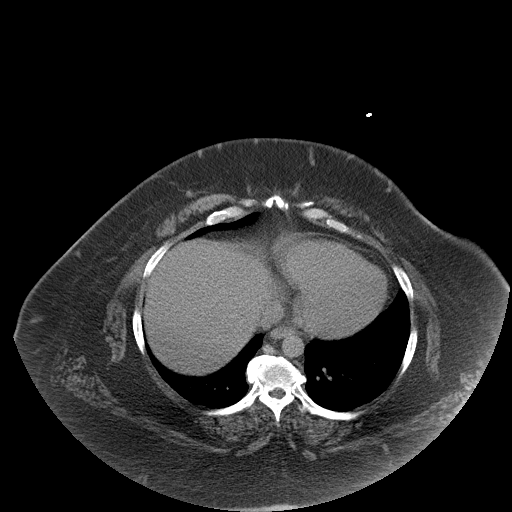

[Series 4: coronal · coronal · 0.99mm/px · 3 of 239 slices shown]
[im 80/239  soft-tissue]
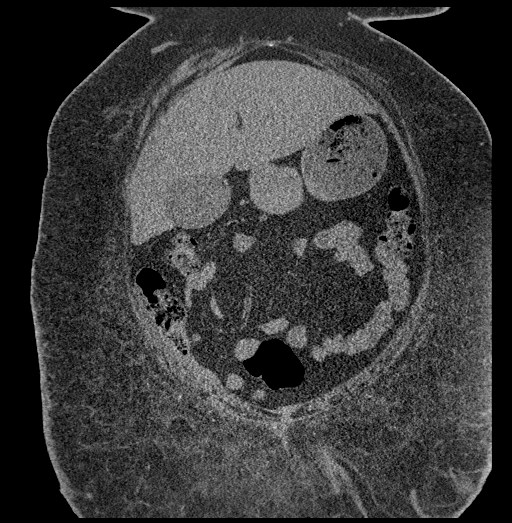
[im 106/239  soft-tissue]
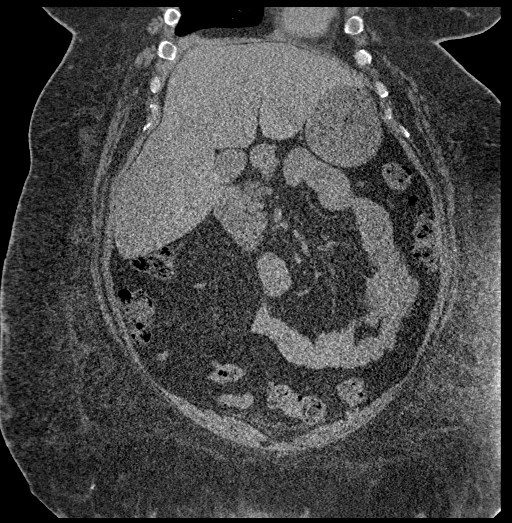
[im 133/239  soft-tissue]
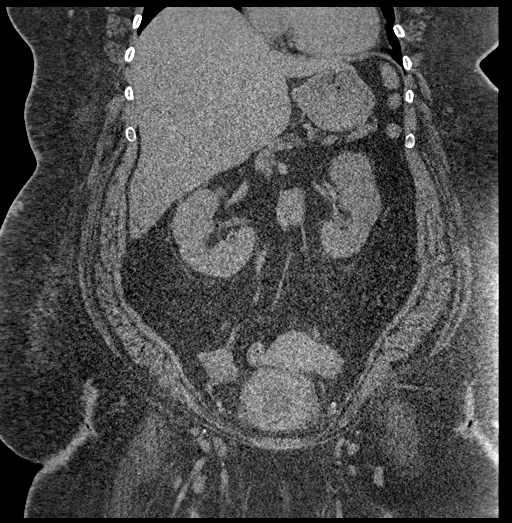

[16 of 46 positions shown; findings below may reference images not displayed]

FINDINGS: Lower chest: The lung bases are clear.

Hepatobiliary: Small stones in the dependent portion of the
gallbladder. Layering increased density of the bile consistent with
sludge. No gallbladder wall thickening or infiltration. No bile duct
dilatation. Liver is moderately enlarged probably due to fatty
infiltration. No focal liver lesions.

Pancreas: Unremarkable. No pancreatic ductal dilatation or
surrounding inflammatory changes.

Spleen: Normal in size without focal abnormality.

Adrenals/Urinary Tract: Right adrenal gland nodule measuring 2.8 cm
diameter. CT density measurements are about 2.6 Hounsfield units,
consistent with a benign fat containing adenoma. No change since
prior study. Kidneys are symmetrical in size. No hydronephrosis or
hydroureter. No renal or ureteral stones identified. The bladder is
diffusely thickened suggesting cystitis.

Stomach/Bowel: Stomach, small bowel, and colon are not abnormally
distended. Appendix is normal. No inflammatory changes or wall
thickening appreciated.

Vascular/Lymphatic: Aortic atherosclerosis. No enlarged abdominal or
pelvic lymph nodes.

Reproductive: Uterus and bilateral adnexa are unremarkable.

Other: No free air or free fluid in the abdomen. Prominent
infiltration in the subcutaneous fat of the anterior abdominal wall
involving the pannus. This could represent cellulitis or edema. No
loculated collections.

Musculoskeletal: Degenerative changes in the spine and hips. No
destructive bone lesions.

Other: Examination is technically limited due to patient's body
habitus.
IMPRESSION: 1. No renal or ureteral stone or obstruction.
2. Diffuse bladder wall thickening suggesting cystitis.
3. Prominent infiltration in the subcutaneous fat of the anterior
abdominal wall involving the pannus. This could represent cellulitis
or edema. No loculated collections.
4. Cholelithiasis and sludge in the gallbladder.
5. Right adrenal gland nodule consistent with benign adenoma.

Aortic Atherosclerosis ([id number]).

## 2022-05-14 ENCOUNTER — Inpatient Hospital Stay (HOSPITAL_COMMUNITY)
Admission: EM | Admit: 2022-05-14 | Discharge: 2022-05-25 | DRG: 617 | Disposition: A | Discharge status: Rehabilitation Facility | Payer: Medicaid Other | Attending: Family Medicine | Admitting: Family Medicine

## 2022-05-14 ENCOUNTER — Other Ambulatory Visit: Payer: Self-pay

## 2022-05-14 ENCOUNTER — Encounter (HOSPITAL_BASED_OUTPATIENT_CLINIC_OR_DEPARTMENT_OTHER): Payer: Medicaid Other | Admitting: General Surgery

## 2022-05-14 ENCOUNTER — Encounter: Payer: Self-pay | Admitting: Family Medicine

## 2022-05-14 ENCOUNTER — Encounter (HOSPITAL_COMMUNITY): Payer: Self-pay

## 2022-05-14 ENCOUNTER — Emergency Department (HOSPITAL_COMMUNITY): Payer: Medicaid Other

## 2022-05-14 DIAGNOSIS — F3181 Bipolar II disorder: Secondary | ICD-10-CM | POA: Diagnosis present

## 2022-05-14 DIAGNOSIS — Z9102 Food additives allergy status: Secondary | ICD-10-CM

## 2022-05-14 DIAGNOSIS — R739 Hyperglycemia, unspecified: Principal | ICD-10-CM

## 2022-05-14 DIAGNOSIS — I10 Essential (primary) hypertension: Secondary | ICD-10-CM | POA: Diagnosis not present

## 2022-05-14 DIAGNOSIS — N179 Acute kidney failure, unspecified: Secondary | ICD-10-CM

## 2022-05-14 DIAGNOSIS — T8743 Infection of amputation stump, right lower extremity: Secondary | ICD-10-CM | POA: Diagnosis present

## 2022-05-14 DIAGNOSIS — I5032 Chronic diastolic (congestive) heart failure: Secondary | ICD-10-CM | POA: Diagnosis not present

## 2022-05-14 DIAGNOSIS — Y835 Amputation of limb(s) as the cause of abnormal reaction of the patient, or of later complication, without mention of misadventure at the time of the procedure: Secondary | ICD-10-CM | POA: Diagnosis present

## 2022-05-14 DIAGNOSIS — S88119A Complete traumatic amputation at level between knee and ankle, unspecified lower leg, initial encounter: Secondary | ICD-10-CM | POA: Diagnosis present

## 2022-05-14 DIAGNOSIS — Z885 Allergy status to narcotic agent status: Secondary | ICD-10-CM

## 2022-05-14 DIAGNOSIS — L97519 Non-pressure chronic ulcer of other part of right foot with unspecified severity: Secondary | ICD-10-CM | POA: Diagnosis present

## 2022-05-14 DIAGNOSIS — E860 Dehydration: Secondary | ICD-10-CM | POA: Diagnosis present

## 2022-05-14 DIAGNOSIS — M86171 Other acute osteomyelitis, right ankle and foot: Secondary | ICD-10-CM | POA: Diagnosis present

## 2022-05-14 DIAGNOSIS — Z89431 Acquired absence of right foot: Secondary | ICD-10-CM | POA: Diagnosis not present

## 2022-05-14 DIAGNOSIS — Z794 Long term (current) use of insulin: Secondary | ICD-10-CM | POA: Diagnosis not present

## 2022-05-14 DIAGNOSIS — E119 Type 2 diabetes mellitus without complications: Secondary | ICD-10-CM | POA: Diagnosis not present

## 2022-05-14 DIAGNOSIS — E1142 Type 2 diabetes mellitus with diabetic polyneuropathy: Secondary | ICD-10-CM | POA: Diagnosis present

## 2022-05-14 DIAGNOSIS — J449 Chronic obstructive pulmonary disease, unspecified: Secondary | ICD-10-CM | POA: Diagnosis present

## 2022-05-14 DIAGNOSIS — Z7985 Long-term (current) use of injectable non-insulin antidiabetic drugs: Secondary | ICD-10-CM

## 2022-05-14 DIAGNOSIS — I130 Hypertensive heart and chronic kidney disease with heart failure and stage 1 through stage 4 chronic kidney disease, or unspecified chronic kidney disease: Secondary | ICD-10-CM | POA: Diagnosis not present

## 2022-05-14 DIAGNOSIS — E785 Hyperlipidemia, unspecified: Secondary | ICD-10-CM | POA: Diagnosis present

## 2022-05-14 DIAGNOSIS — M10061 Idiopathic gout, right knee: Secondary | ICD-10-CM | POA: Diagnosis not present

## 2022-05-14 DIAGNOSIS — I2510 Atherosclerotic heart disease of native coronary artery without angina pectoris: Secondary | ICD-10-CM | POA: Diagnosis present

## 2022-05-14 DIAGNOSIS — Z888 Allergy status to other drugs, medicaments and biological substances status: Secondary | ICD-10-CM | POA: Diagnosis not present

## 2022-05-14 DIAGNOSIS — Z818 Family history of other mental and behavioral disorders: Secondary | ICD-10-CM

## 2022-05-14 DIAGNOSIS — M1710 Unilateral primary osteoarthritis, unspecified knee: Secondary | ICD-10-CM | POA: Diagnosis present

## 2022-05-14 DIAGNOSIS — M79661 Pain in right lower leg: Secondary | ICD-10-CM | POA: Diagnosis not present

## 2022-05-14 DIAGNOSIS — G8929 Other chronic pain: Secondary | ICD-10-CM | POA: Diagnosis present

## 2022-05-14 DIAGNOSIS — N1831 Chronic kidney disease, stage 3a: Secondary | ICD-10-CM | POA: Diagnosis not present

## 2022-05-14 DIAGNOSIS — G4733 Obstructive sleep apnea (adult) (pediatric): Secondary | ICD-10-CM | POA: Diagnosis present

## 2022-05-14 DIAGNOSIS — D649 Anemia, unspecified: Secondary | ICD-10-CM | POA: Diagnosis not present

## 2022-05-14 DIAGNOSIS — L03115 Cellulitis of right lower limb: Secondary | ICD-10-CM | POA: Diagnosis present

## 2022-05-14 DIAGNOSIS — B379 Candidiasis, unspecified: Secondary | ICD-10-CM | POA: Clinically undetermined

## 2022-05-14 DIAGNOSIS — Z89612 Acquired absence of left leg above knee: Secondary | ICD-10-CM

## 2022-05-14 DIAGNOSIS — E875 Hyperkalemia: Secondary | ICD-10-CM

## 2022-05-14 DIAGNOSIS — Z7982 Long term (current) use of aspirin: Secondary | ICD-10-CM

## 2022-05-14 DIAGNOSIS — Z9989 Dependence on other enabling machines and devices: Secondary | ICD-10-CM | POA: Diagnosis not present

## 2022-05-14 DIAGNOSIS — E1100 Type 2 diabetes mellitus with hyperosmolarity without nonketotic hyperglycemic-hyperosmolar coma (NKHHC): Secondary | ICD-10-CM

## 2022-05-14 DIAGNOSIS — E440 Moderate protein-calorie malnutrition: Secondary | ICD-10-CM | POA: Diagnosis not present

## 2022-05-14 DIAGNOSIS — E1122 Type 2 diabetes mellitus with diabetic chronic kidney disease: Secondary | ICD-10-CM | POA: Diagnosis present

## 2022-05-14 DIAGNOSIS — E1151 Type 2 diabetes mellitus with diabetic peripheral angiopathy without gangrene: Secondary | ICD-10-CM | POA: Diagnosis present

## 2022-05-14 DIAGNOSIS — Z79899 Other long term (current) drug therapy: Secondary | ICD-10-CM

## 2022-05-14 DIAGNOSIS — Z8249 Family history of ischemic heart disease and other diseases of the circulatory system: Secondary | ICD-10-CM

## 2022-05-14 DIAGNOSIS — K219 Gastro-esophageal reflux disease without esophagitis: Secondary | ICD-10-CM | POA: Diagnosis present

## 2022-05-14 DIAGNOSIS — E11621 Type 2 diabetes mellitus with foot ulcer: Principal | ICD-10-CM | POA: Diagnosis present

## 2022-05-14 DIAGNOSIS — E1169 Type 2 diabetes mellitus with other specified complication: Secondary | ICD-10-CM | POA: Diagnosis not present

## 2022-05-14 DIAGNOSIS — Z83438 Family history of other disorder of lipoprotein metabolism and other lipidemia: Secondary | ICD-10-CM

## 2022-05-14 DIAGNOSIS — Z833 Family history of diabetes mellitus: Secondary | ICD-10-CM

## 2022-05-14 DIAGNOSIS — T8740 Infection of amputation stump, unspecified extremity: Secondary | ICD-10-CM | POA: Diagnosis not present

## 2022-05-14 DIAGNOSIS — I739 Peripheral vascular disease, unspecified: Secondary | ICD-10-CM | POA: Diagnosis present

## 2022-05-14 DIAGNOSIS — Z7951 Long term (current) use of inhaled steroids: Secondary | ICD-10-CM

## 2022-05-14 DIAGNOSIS — Z6841 Body Mass Index (BMI) 40.0 and over, adult: Secondary | ICD-10-CM | POA: Diagnosis not present

## 2022-05-14 DIAGNOSIS — M109 Gout, unspecified: Secondary | ICD-10-CM | POA: Diagnosis present

## 2022-05-14 DIAGNOSIS — R251 Tremor, unspecified: Secondary | ICD-10-CM | POA: Diagnosis not present

## 2022-05-14 DIAGNOSIS — L0390 Cellulitis, unspecified: Secondary | ICD-10-CM | POA: Diagnosis not present

## 2022-05-14 DIAGNOSIS — M869 Osteomyelitis, unspecified: Secondary | ICD-10-CM | POA: Diagnosis not present

## 2022-05-14 DIAGNOSIS — N183 Chronic kidney disease, stage 3 unspecified: Secondary | ICD-10-CM | POA: Diagnosis not present

## 2022-05-14 DIAGNOSIS — I5030 Unspecified diastolic (congestive) heart failure: Secondary | ICD-10-CM | POA: Diagnosis not present

## 2022-05-14 DIAGNOSIS — E8770 Fluid overload, unspecified: Secondary | ICD-10-CM | POA: Diagnosis not present

## 2022-05-14 DIAGNOSIS — E1165 Type 2 diabetes mellitus with hyperglycemia: Secondary | ICD-10-CM

## 2022-05-14 DIAGNOSIS — Z825 Family history of asthma and other chronic lower respiratory diseases: Secondary | ICD-10-CM

## 2022-05-14 DIAGNOSIS — I5033 Acute on chronic diastolic (congestive) heart failure: Secondary | ICD-10-CM

## 2022-05-14 DIAGNOSIS — L089 Local infection of the skin and subcutaneous tissue, unspecified: Secondary | ICD-10-CM

## 2022-05-14 DIAGNOSIS — T8781 Dehiscence of amputation stump: Secondary | ICD-10-CM | POA: Diagnosis present

## 2022-05-14 DIAGNOSIS — Z87891 Personal history of nicotine dependence: Secondary | ICD-10-CM

## 2022-05-14 DIAGNOSIS — F419 Anxiety disorder, unspecified: Secondary | ICD-10-CM | POA: Diagnosis present

## 2022-05-14 DIAGNOSIS — Z7409 Other reduced mobility: Secondary | ICD-10-CM | POA: Diagnosis present

## 2022-05-14 HISTORY — DX: Other specified abnormal findings of blood chemistry: R79.89

## 2022-05-14 HISTORY — DX: Other specified abnormalities of plasma proteins: R77.8

## 2022-05-14 HISTORY — DX: Cutaneous abscess of groin: L02.214

## 2022-05-14 LAB — CBG MONITORING, ED
Glucose-Capillary: >600 mg/dL (ref 70–99)
Glucose-Capillary: >600 mg/dL (ref 70–99)
Glucose-Capillary: >600 mg/dL (ref 70–99)
Glucose-Capillary: >600 mg/dL (ref 70–99)
Glucose-Capillary: >600 mg/dL (ref 70–99)
Glucose-Capillary: >600 mg/dL (ref 70–99)
Glucose-Capillary: >600 mg/dL (ref 70–99)
Glucose-Capillary: >600 mg/dL (ref 70–99)
Glucose-Capillary: 545 mg/dL (ref 70–99)
Glucose-Capillary: 599 mg/dL (ref 70–99)

## 2022-05-14 LAB — CBC WITH DIFFERENTIAL/PLATELET
Abs Immature Granulocytes: 0.21 10*3/uL — ABNORMAL HIGH (ref 0.00–0.07)
Basophils Absolute: 0.1 10*3/uL (ref 0.0–0.1)
Basophils Relative: 0 %
Eosinophils Absolute: 0 10*3/uL (ref 0.0–0.5)
Eosinophils Relative: 0 %
HCT: 38.9 % (ref 36.0–46.0)
Hemoglobin: 12.3 g/dL (ref 12.0–15.0)
Immature Granulocytes: 1 %
Lymphocytes Relative: 7 %
Lymphs Abs: 1 10*3/uL (ref 0.7–4.0)
MCH: 28.8 pg (ref 26.0–34.0)
MCHC: 31.6 g/dL (ref 30.0–36.0)
MCV: 91.1 fL (ref 80.0–100.0)
Monocytes Absolute: 0.6 10*3/uL (ref 0.1–1.0)
Monocytes Relative: 4 %
Neutro Abs: 13.6 10*3/uL — ABNORMAL HIGH (ref 1.7–7.7)
Neutrophils Relative %: 88 %
Platelets: 341 10*3/uL (ref 150–400)
RBC: 4.27 MIL/uL (ref 3.87–5.11)
RDW: 13.6 % (ref 11.5–15.5)
WBC: 15.6 10*3/uL — ABNORMAL HIGH (ref 4.0–10.5)
nRBC: 0 % (ref 0.0–0.2)

## 2022-05-14 LAB — BASIC METABOLIC PANEL
Anion gap: 18 — ABNORMAL HIGH (ref 5–15)
Anion gap: 17 — ABNORMAL HIGH (ref 5–15)
Anion gap: 15 (ref 5–15)
BUN: 37 mg/dL — ABNORMAL HIGH (ref 6–20)
BUN: 36 mg/dL — ABNORMAL HIGH (ref 6–20)
BUN: 36 mg/dL — ABNORMAL HIGH (ref 6–20)
CO2: 24 mmol/L (ref 22–32)
CO2: 24 mmol/L (ref 22–32)
CO2: 24 mmol/L (ref 22–32)
Calcium: 8.3 mg/dL — ABNORMAL LOW (ref 8.9–10.3)
Calcium: 8.4 mg/dL — ABNORMAL LOW (ref 8.9–10.3)
Calcium: 8.8 mg/dL — ABNORMAL LOW (ref 8.9–10.3)
Chloride: 82 mmol/L — ABNORMAL LOW (ref 98–111)
Chloride: 87 mmol/L — ABNORMAL LOW (ref 98–111)
Chloride: 93 mmol/L — ABNORMAL LOW (ref 98–111)
Creatinine, Ser: 1.57 mg/dL — ABNORMAL HIGH (ref 0.44–1.00)
Creatinine, Ser: 1.63 mg/dL — ABNORMAL HIGH (ref 0.44–1.00)
Creatinine, Ser: 1.46 mg/dL — ABNORMAL HIGH (ref 0.44–1.00)
GFR, Estimated: 41 mL/min — ABNORMAL LOW (ref >60)
GFR, Estimated: 40 mL/min — ABNORMAL LOW (ref >60)
GFR, Estimated: 45 mL/min — ABNORMAL LOW (ref >60)
Glucose, Bld: 793 mg/dL (ref 70–99)
Glucose, Bld: 638 mg/dL (ref 70–99)
Glucose, Bld: 329 mg/dL — ABNORMAL HIGH (ref 70–99)
Potassium: 4.1 mmol/L (ref 3.5–5.1)
Potassium: 3.7 mmol/L (ref 3.5–5.1)
Potassium: 3.5 mmol/L (ref 3.5–5.1)
Sodium: 124 mmol/L — ABNORMAL LOW (ref 135–145)
Sodium: 128 mmol/L — ABNORMAL LOW (ref 135–145)
Sodium: 132 mmol/L — ABNORMAL LOW (ref 135–145)

## 2022-05-14 LAB — I-STAT CHEM 8, ED
BUN: 40 mg/dL — ABNORMAL HIGH (ref 6–20)
Calcium, Ion: 0.97 mmol/L — ABNORMAL LOW (ref 1.15–1.40)
Chloride: 87 mmol/L — ABNORMAL LOW (ref 98–111)
Creatinine, Ser: 1.3 mg/dL — ABNORMAL HIGH (ref 0.44–1.00)
Glucose, Bld: >700 mg/dL (ref 70–99)
HCT: 42 % (ref 36.0–46.0)
Hemoglobin: 14.3 g/dL (ref 12.0–15.0)
Potassium: 4.1 mmol/L (ref 3.5–5.1)
Sodium: 124 mmol/L — ABNORMAL LOW (ref 135–145)
TCO2: 28 mmol/L (ref 22–32)

## 2022-05-14 LAB — GLUCOSE, CAPILLARY
Glucose-Capillary: 239 mg/dL — ABNORMAL HIGH (ref 70–99)
Glucose-Capillary: 308 mg/dL — ABNORMAL HIGH (ref 70–99)
Glucose-Capillary: 337 mg/dL — ABNORMAL HIGH (ref 70–99)
Glucose-Capillary: 377 mg/dL — ABNORMAL HIGH (ref 70–99)
Glucose-Capillary: 423 mg/dL — ABNORMAL HIGH (ref 70–99)
Glucose-Capillary: 433 mg/dL — ABNORMAL HIGH (ref 70–99)
Glucose-Capillary: 438 mg/dL — ABNORMAL HIGH (ref 70–99)

## 2022-05-14 LAB — URINALYSIS, ROUTINE W REFLEX MICROSCOPIC
Bilirubin Urine: NEGATIVE
Glucose, UA: >=500 mg/dL — AB
Ketones, ur: 5 mg/dL — AB
Leukocytes,Ua: NEGATIVE
Nitrite: NEGATIVE
Protein, ur: NEGATIVE mg/dL
Specific Gravity, Urine: 1.016 (ref 1.005–1.030)
pH: 5 (ref 5.0–8.0)

## 2022-05-14 LAB — I-STAT BETA HCG BLOOD, ED (MC, WL, AP ONLY): I-stat hCG, quantitative: <5 m[IU]/mL (ref <5)

## 2022-05-14 LAB — I-STAT VENOUS BLOOD GAS, ED
Acid-Base Excess: 4 mmol/L — ABNORMAL HIGH (ref 0.0–2.0)
Bicarbonate: 28.5 mmol/L — ABNORMAL HIGH (ref 20.0–28.0)
Calcium, Ion: 0.96 mmol/L — ABNORMAL LOW (ref 1.15–1.40)
HCT: 42 % (ref 36.0–46.0)
Hemoglobin: 14.3 g/dL (ref 12.0–15.0)
O2 Saturation: 100 %
Potassium: 4.1 mmol/L (ref 3.5–5.1)
Sodium: 123 mmol/L — ABNORMAL LOW (ref 135–145)
TCO2: 30 mmol/L (ref 22–32)
pCO2, Ven: 43.4 mmHg — ABNORMAL LOW (ref 44–60)
pH, Ven: 7.425 (ref 7.25–7.43)
pO2, Ven: 190 mmHg — ABNORMAL HIGH (ref 32–45)

## 2022-05-14 LAB — LACTIC ACID, PLASMA
Lactic Acid, Venous: 3.3 mmol/L (ref 0.5–1.9)
Lactic Acid, Venous: 5.6 mmol/L (ref 0.5–1.9)

## 2022-05-14 LAB — BETA-HYDROXYBUTYRIC ACID: Beta-Hydroxybutyric Acid: 1.74 mmol/L — ABNORMAL HIGH (ref 0.05–0.27)

## 2022-05-14 LAB — SEDIMENTATION RATE: Sed Rate: 115 mm/hr — ABNORMAL HIGH (ref 0–22)

## 2022-05-14 LAB — OSMOLALITY: Osmolality: 330 mOsm/kg (ref 275–295)

## 2022-05-14 LAB — C-REACTIVE PROTEIN: CRP: 22.6 mg/dL — ABNORMAL HIGH (ref <1.0)

## 2022-05-14 MED ORDER — LACTATED RINGERS IV BOLUS
1000.0000 mL | Freq: Once | INTRAVENOUS | Status: AC
  Administered 2022-05-14: 1000 mL via INTRAVENOUS

## 2022-05-14 MED ORDER — LACTATED RINGERS IV SOLN: INTRAVENOUS | Status: DC

## 2022-05-14 MED ORDER — MOMETASONE FURO-FORMOTEROL FUM 200-5 MCG/ACT IN AERO
2.0000 | INHALATION_SPRAY | Freq: Two times a day (BID) | RESPIRATORY_TRACT | Status: DC
  Administered 2022-05-14 – 2022-05-20 (×7): 2 via RESPIRATORY_TRACT
  Filled 2022-05-14: qty 8.8

## 2022-05-14 MED ORDER — HYDROXYZINE HCL 25 MG PO TABS
50.0000 mg | ORAL_TABLET | Freq: Once | ORAL | Status: AC
  Administered 2022-05-14: 50 mg via ORAL
  Filled 2022-05-14: qty 2

## 2022-05-14 MED ORDER — INSULIN REGULAR(HUMAN) IN NACL 100-0.9 UT/100ML-% IV SOLN
INTRAVENOUS | Status: DC
  Administered 2022-05-14: 13 [IU]/h via INTRAVENOUS
  Administered 2022-05-14: 16 [IU]/h via INTRAVENOUS
  Administered 2022-05-15: 9 [IU]/h via INTRAVENOUS
  Filled 2022-05-14 (×3): qty 100

## 2022-05-14 MED ORDER — POTASSIUM CHLORIDE 10 MEQ/100ML IV SOLN
10.0000 meq | INTRAVENOUS | Status: AC
  Administered 2022-05-14 (×2): 10 meq via INTRAVENOUS
  Filled 2022-05-14 (×2): qty 100

## 2022-05-14 MED ORDER — LACTATED RINGERS IV BOLUS: 20.0000 mL/kg | Freq: Once | INTRAVENOUS | Status: DC

## 2022-05-14 MED ORDER — ENOXAPARIN SODIUM 40 MG/0.4ML IJ SOSY
40.0000 mg | PREFILLED_SYRINGE | INTRAMUSCULAR | Status: DC
  Administered 2022-05-14: 40 mg via SUBCUTANEOUS
  Filled 2022-05-14: qty 0.4

## 2022-05-14 MED ORDER — VANCOMYCIN HCL 1500 MG/300ML IV SOLN
1500.0000 mg | INTRAVENOUS | Status: DC
Start: 2022-05-15 — End: 2022-05-15
  Filled 2022-05-14: qty 300

## 2022-05-14 MED ORDER — GABAPENTIN 600 MG PO TABS
1200.0000 mg | ORAL_TABLET | Freq: Three times a day (TID) | ORAL | Status: DC
  Administered 2022-05-14 – 2022-05-17 (×8): 1200 mg via ORAL
  Filled 2022-05-14 (×8): qty 2

## 2022-05-14 MED ORDER — ALBUTEROL SULFATE (2.5 MG/3ML) 0.083% IN NEBU: 3.0000 mL | INHALATION_SOLUTION | Freq: Four times a day (QID) | RESPIRATORY_TRACT | Status: DC | PRN

## 2022-05-14 MED ORDER — ASPIRIN 81 MG PO TBEC
81.0000 mg | DELAYED_RELEASE_TABLET | Freq: Every day | ORAL | Status: DC
  Administered 2022-05-14 – 2022-05-25 (×12): 81 mg via ORAL
  Filled 2022-05-14 (×12): qty 1

## 2022-05-14 MED ORDER — VANCOMYCIN HCL 10 G IV SOLR
2500.0000 mg | Freq: Once | INTRAVENOUS | Status: DC
  Filled 2022-05-14: qty 25

## 2022-05-14 MED ORDER — SODIUM CHLORIDE 0.9 % IV BOLUS
1000.0000 mL | Freq: Once | INTRAVENOUS | Status: AC
  Administered 2022-05-14: 1000 mL via INTRAVENOUS

## 2022-05-14 MED ORDER — METRONIDAZOLE 500 MG/100ML IV SOLN: 500.0000 mg | Freq: Two times a day (BID) | INTRAVENOUS | Status: DC

## 2022-05-14 MED ORDER — FENTANYL CITRATE PF 50 MCG/ML IJ SOSY
50.0000 ug | PREFILLED_SYRINGE | Freq: Once | INTRAMUSCULAR | Status: AC
  Administered 2022-05-14: 50 ug via INTRAVENOUS
  Filled 2022-05-14: qty 1

## 2022-05-14 MED ORDER — REVEFENACIN 175 MCG/3ML IN SOLN
175.0000 ug | Freq: Every day | RESPIRATORY_TRACT | Status: DC
  Administered 2022-05-15 – 2022-05-20 (×4): 175 ug via RESPIRATORY_TRACT
  Filled 2022-05-14 (×13): qty 3

## 2022-05-14 MED ORDER — DEXTROSE IN LACTATED RINGERS 5 % IV SOLN: INTRAVENOUS | Status: DC

## 2022-05-14 MED ORDER — DULOXETINE HCL 60 MG PO CPEP
60.0000 mg | ORAL_CAPSULE | Freq: Every day | ORAL | Status: DC
  Administered 2022-05-15 – 2022-05-25 (×11): 60 mg via ORAL
  Filled 2022-05-14 (×11): qty 1

## 2022-05-14 MED ORDER — TIOTROPIUM BROMIDE MONOHYDRATE 1.25 MCG/ACT IN AERS: 2.0000 | INHALATION_SPRAY | Freq: Every day | RESPIRATORY_TRACT | Status: DC

## 2022-05-14 MED ORDER — DEXTROSE 50 % IV SOLN: 0.0000 mL | INTRAVENOUS | Status: DC | PRN

## 2022-05-14 MED ORDER — OXYCODONE-ACETAMINOPHEN 5-325 MG PO TABS
1.0000 | ORAL_TABLET | Freq: Four times a day (QID) | ORAL | Status: DC | PRN
  Administered 2022-05-14 – 2022-05-15 (×3): 1 via ORAL
  Filled 2022-05-14 (×4): qty 1

## 2022-05-14 MED ORDER — METRONIDAZOLE 500 MG/100ML IV SOLN
500.0000 mg | Freq: Two times a day (BID) | INTRAVENOUS | Status: DC
Start: 2022-05-14 — End: 2022-05-17
  Administered 2022-05-14 – 2022-05-17 (×6): 500 mg via INTRAVENOUS
  Filled 2022-05-14 (×6): qty 100

## 2022-05-14 MED ORDER — CEFEPIME HCL 2 G IV SOLR
2.0000 g | Freq: Three times a day (TID) | INTRAVENOUS | Status: DC
  Administered 2022-05-14 – 2022-05-15 (×3): 2 g via INTRAVENOUS
  Filled 2022-05-14 (×3): qty 12.5

## 2022-05-14 MED ORDER — PREDNISOLONE ACETATE 1 % OP SUSP
1.0000 [drp] | Freq: Four times a day (QID) | OPHTHALMIC | Status: DC
Start: 2022-05-14 — End: 2022-05-25
  Administered 2022-05-15 – 2022-05-18 (×2): 1 [drp] via OPHTHALMIC
  Filled 2022-05-14: qty 5

## 2022-05-14 MED ORDER — ACETAMINOPHEN 325 MG PO TABS
650.0000 mg | ORAL_TABLET | Freq: Four times a day (QID) | ORAL | Status: DC | PRN
  Administered 2022-05-20: 650 mg via ORAL
  Filled 2022-05-14: qty 2

## 2022-05-14 NOTE — Assessment & Plan Note (Addendum)
Euvolemic.  Weight today is 158.2kg -Holding home torsemide due to recent AKI -Daily weights, strict I's and O's

## 2022-05-14 NOTE — Assessment & Plan Note (Deleted)
On home BiPAP nightly -continue nightly BiPAP

## 2022-05-14 NOTE — Assessment & Plan Note (Addendum)
HHS resolved. Appropriate glycemic control (CBG 64-204) over past 24 hours. -Continue Semglee 50 units BID -Reduce Novolog 20units TID with meals -Continue resistant SSI -Carb modified diet -CBG monitoring qAC and qHS

## 2022-05-14 NOTE — Progress Notes (Addendum)
Placed on BiPAP auto titrate with 2 liter O2 bleed-in per order. Pt tolerating well at this time.

## 2022-05-14 NOTE — Progress Notes (Signed)
Lab called to report STAT BMP has been hemolyzed. Will attempt to obtain new BMP.

## 2022-05-14 NOTE — ED Provider Notes (Signed)
[facility] EMERGENCY DEPARTMENT Provider Note   CSN: [id number] Arrival date & time: 05/14/22  1043     History  Chief Complaint  Patient presents with   Hyperglycemia    [name] is a 46 y.o. female history includes morbid obesity, COPD/asthma, GERD, hypertension, hyperlipidemia, poorly controlled insulin-dependent diabetes, left BKA, right partial foot amputation, osteomyelitis.  Patient presented for evaluation of hyperglycemia, patient reports that she has been taking her medications as prescribed by her endocrinologist however she has had persistent elevated blood sugars above 600 at home.  Patient reports that she went to her endocrinologist office 3 days ago she was told to go to the ER for further management however patient reports that she did not want to come to the ER so she delayed presentation.  Upon arrival today she reports that she is feeling generally fatigued and unwell she does not have a specific complaint, she reports she has been feeling thirstier than normal and also experienced increased urination.  Patient does report that she has chronic right foot pain and wound on the foot, she reports this has been a sharp pain worsens with palpation and movement and without alleviating factors, pain does not radiate.  She denies fall/injury, fever, chills, chest pain, nausea/vomiting, diarrhea or any additional concerns.  HPI     Home Medications Prior to Admission medications   Medication Sig Start Date End Date Taking? Authorizing Provider  Accu-Chek FastClix Lancets MISC USE DAILY AS INSTRUCTED 05/18/21   [name], MD  ACCU-CHEK GUIDE test strip USE T0 CHECK BLOOD SUGAR 3 TIMES DAILY 11/05/21   [name], MD  Accu-Chek Softclix Lancets lancets Use to check blood glucose four times daily 04/17/20   Martyn Malay, MD  albuterol (VENTOLIN HFA) 108 (90 Base) MCG/ACT inhaler INHALE 1 TO 2 PUFFS BY MOUTH EVERY SIX HOURS AS NEEDED FOR  WHEEZING OR SHORTNESS OF BREATH Patient taking differently: Inhale 1 puff into the lungs every 6 (six) hours as needed for shortness of breath. 07/16/21   [name], MD  aspirin EC 81 MG tablet Take 1 tablet (81 mg total) by mouth daily. Swallow whole. 11/19/20   Sherren Mocha, MD  cetirizine (ZYRTEC) 10 MG tablet Take 1 tablet (10 mg total) by mouth daily as needed for allergies. 02/11/22   [name], MD  Cholecalciferol 1000 units tablet Take 1 tablet (1,000 Units total) by mouth daily. 04/05/17   [name], MD  Continuous Blood Gluc Sensor (DEXCOM G6 SENSOR) MISC Inject 1 applicator into the skin as directed. Change sensor every 10 days. 11/05/21   [name], MD  Continuous Blood Gluc Transmit (DEXCOM G6 TRANSMITTER) MISC Inject 1 Device into the skin as directed. Reuse 8 times with sensor changes. 09/03/21   [name], MD  DULoxetine (CYMBALTA) 30 MG capsule Take 1 capsule (30 mg total) by mouth daily. X 1 week; then 60 mg daily- for knee and back/nerve pain 02/15/22   Lovorn, Jinny Blossom, MD  gabapentin (NEURONTIN) 600 MG tablet TAKE 2 TABLETS BY MOUTH 3 TIMES DAILY 04/16/22   [name], MD  Glucagon (BAQSIMI ONE PACK) 3 MG/DOSE POWD Place 3 mg into the nose as needed (low blood sugar).    [provider]  Glucagon, rDNA, (GLUCAGON EMERGENCY) 1 MG KIT Inject 1 mg into the muscle as needed for low blood sugar. 11/03/21   [provider]  GNP ULTICARE PEN NEEDLES 32G X 4 MM MISC  USE TO inject insulin 2 TIMES DAILY 10/02/21   [name], MD  insulin aspart (NOVOLOG FLEXPEN) 100 UNIT/ML FlexPen Inject 20 Units into the skin 3 (three) times daily with meals. Patient taking differently: Inject 30 Units into the skin 3 (three) times daily with meals. Sliding scale 10/25/21   Brimage, Ronnette Juniper, DO  insulin degludec (TRESIBA FLEXTOUCH) 200 UNIT/ML FlexTouch Pen Inject 46 Units into the skin 2 (two) times daily. Patient taking  differently: Inject 30 Units into the skin 2 (two) times daily. 10/25/21   Brimage, Ronnette Juniper, DO  micafungin in sodium chloride 0.9 % 100 mL Inject into the vein daily. Per ID at [facility]    [provider]  Misc. Devices MISC Inserts and toe filler for R foot 03/18/22   [name], MD  nystatin (MYCOSTATIN/NYSTOP) powder Apply 1 application. topically 3 (three) times daily. 03/30/22   [name], MD  ondansetron (ZOFRAN) 4 MG tablet Take 1 tablet (4 mg total) by mouth every 8 (eight) hours as needed for nausea or vomiting. 02/17/22   Lovorn, Jinny Blossom, MD  oxyCODONE-acetaminophen (PERCOCET) 7.5-325 MG tablet Take 1 tablet by mouth every 6 (six) hours as needed for severe pain. 04/27/22   [name], MD  oxyCODONE-acetaminophen (PERCOCET) 7.5-325 MG tablet Take 1 tablet by mouth every 6 (six) hours as needed for severe pain. 04/27/22   [name], MD  potassium chloride SA (KLOR-CON M20) 20 MEQ tablet Take 1 tablet (20 mEq total) by mouth daily. 09/09/20   Richardson Dopp T, PA-C  prednisoLONE acetate (PRED FORTE) 1 % ophthalmic suspension Place 1 drop into both eyes 4 (four) times daily.    [provider]  ramelteon (ROZEREM) 8 MG tablet Take 1 tablet (8 mg total) by mouth at bedtime. 03/23/22   [name], MD  SYMBICORT 160-4.5 MCG/ACT inhaler inhale 2 PUFFS into THE lungs 2 TIMES DAILY Patient taking differently: 2 puffs every 6 (six) hours. Shortness of breath 01/04/22   [name], MD  Tiotropium Bromide Monohydrate (SPIRIVA RESPIMAT) 1.25 MCG/ACT AERS Inhale 2 puffs into the lungs daily. 11/14/20   Martyn Ehrich, NP  torsemide (DEMADEX) 20 MG tablet Take 85m in am and 474min pm 04/14/22   XuFlorencia ReasonsMD  TRULICITY 3 MGYT/0.3TWOPN Inject 3 mg into the skin once a week. Sundays 01/04/22   [provider]      Allergies    Cefepime, Iodine, Kiwi extract, Morphine and related, Nalbuphine, Trental [pentoxifylline], Toradol  [ketorolac tromethamine], Morphine, and Nubain [nalbuphine hcl]    Review of Systems   Review of Systems Ten systems are reviewed and are negative for acute change except as noted in the HPI  Physical Exam Updated Vital Signs BP (!) 118/58   Pulse 98   Temp 98.4 F (36.9 C) (Oral)   Resp 11   Ht '5\' 2"'  (1.575 m)   Wt (!) 154.2 kg   SpO2 96%   BMI 62.19 kg/m  Physical Exam Constitutional:      General: She is not in acute distress.    Appearance: Normal appearance. She is well-developed. She is obese. She is ill-appearing (Chronically ill-appearing). She is not diaphoretic.  HENT:     Head: Normocephalic and atraumatic.  Eyes:     General: Vision grossly intact. Gaze aligned appropriately.     Pupils: Pupils are equal, round, and reactive to light.  Neck:     Trachea: Trachea and phonation normal.  Pulmonary:  Effort: Pulmonary effort is normal. No respiratory distress.  Abdominal:     General: There is no distension.     Palpations: Abdomen is soft.     Tenderness: There is no abdominal tenderness. There is no guarding or rebound.  Musculoskeletal:        General: Normal range of motion.     Cervical back: Normal range of motion.     Comments: Patient appears to have transmetatarsal amputation of the right foot with a large open wound over the medial side of the foot as seen in picture below.  Surrounding erythema without streaking.  Small amount of purulent drainage present within the wound.  Skin:    General: Skin is warm and dry.  Neurological:     Mental Status: She is alert.     GCS: GCS eye subscore is 4. GCS verbal subscore is 5. GCS motor subscore is 6.     Comments: Speech is clear and goal oriented, follows commands Major Cranial nerves without deficit, no facial droop Moves extremities without ataxia, coordination intact  Psychiatric:        Behavior: Behavior normal.      ED Results / Procedures / Treatments   Labs (all labs ordered are listed, but  only abnormal results are displayed) Labs Reviewed  BASIC METABOLIC PANEL - Abnormal; Notable for the following components:      Result Value   Sodium 124 (*)    Chloride 82 (*)    Glucose, Bld 793 (*)    BUN 37 (*)    Creatinine, Ser 1.57 (*)    Calcium 8.3 (*)    GFR, Estimated 41 (*)    Anion gap 18 (*)    All other components within normal limits  CBC WITH DIFFERENTIAL/PLATELET - Abnormal; Notable for the following components:   WBC 15.6 (*)    Neutro Abs 13.6 (*)    Abs Immature Granulocytes 0.21 (*)    All other components within normal limits  URINALYSIS, ROUTINE W REFLEX MICROSCOPIC - Abnormal; Notable for the following components:   Color, Urine STRAW (*)    Glucose, UA >=500 (*)    Hgb urine dipstick SMALL (*)    Ketones, ur 5 (*)    Bacteria, UA FEW (*)    All other components within normal limits  BETA-HYDROXYBUTYRIC ACID - Abnormal; Notable for the following components:   Beta-Hydroxybutyric Acid 1.74 (*)    All other components within normal limits  OSMOLALITY - Abnormal; Notable for the following components:   Osmolality 330 (*)    All other components within normal limits  SEDIMENTATION RATE - Abnormal; Notable for the following components:   Sed Rate 115 (*)    All other components within normal limits  LACTIC ACID, PLASMA - Abnormal; Notable for the following components:   Lactic Acid, Venous 3.3 (*)    All other components within normal limits  CBG MONITORING, ED - Abnormal; Notable for the following components:   Glucose-Capillary >600 (*)    All other components within normal limits  CBG MONITORING, ED - Abnormal; Notable for the following components:   Glucose-Capillary >600 (*)    All other components within normal limits  I-STAT CHEM 8, ED - Abnormal; Notable for the following components:   Sodium 124 (*)    Chloride 87 (*)    BUN 40 (*)    Creatinine, Ser 1.30 (*)    Glucose, Bld >700 (*)    Calcium, Ion 0.97 (*)  All other components  within normal limits  I-STAT VENOUS BLOOD GAS, ED - Abnormal; Notable for the following components:   pCO2, Ven 43.4 (*)    pO2, Ven 190 (*)    Bicarbonate 28.5 (*)    Acid-Base Excess 4.0 (*)    Sodium 123 (*)    Calcium, Ion 0.96 (*)    All other components within normal limits  CBG MONITORING, ED - Abnormal; Notable for the following components:   Glucose-Capillary >600 (*)    All other components within normal limits  CULTURE, BLOOD (ROUTINE X 2)  CULTURE, BLOOD (ROUTINE X 2)  C-REACTIVE PROTEIN  LACTIC ACID, PLASMA  I-STAT BETA HCG BLOOD, ED ([facility], [facility], [facility] ONLY)    EKG None  Radiology DG Foot Complete Right  Result Date: 05/14/2022 CLINICAL DATA:  Diabetic foot ulcer EXAM: RIGHT FOOT COMPLETE - 3+ VIEW COMPARISON:  RIGHT foot XRs, 12/18/2021. FINDINGS: Transmetatarsal amputations at the RIGHT foot. Enlarged soft tissue defect at the superomedial margin of the stump. Question demineralization and loss of cortication at the first metatarsal stump. 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 IMPRESSION: Enlarged soft tissue ulcer with question of demineralization and loss of cortication at the first metatarsal stump. Findings suspicious for osteomyelitis. Consider MR for further evaluation. Electronically Signed   By: Michaelle Birks M.D.   On: 05/14/2022 12:01    Procedures Procedures    Medications Ordered in ED Medications  insulin regular, human (MYXREDLIN) 100 units/ 100 mL infusion (13 Units/hr Intravenous New Bag/Given 05/14/22 1324)  lactated ringers infusion (has no administration in time range)  dextrose 5 % in lactated ringers infusion (has no administration in time range)  dextrose 50 % solution 0-50 mL (has no administration in time range)  potassium chloride 10 mEq in 100 mL IVPB (10 mEq Intravenous New Bag/Given 05/14/22 1335)  sodium chloride 0.9 % bolus 1,000 mL (0 mLs Intravenous Stopped 05/14/22 1325)  fentaNYL (SUBLIMAZE) injection 50 mcg (50 mcg  Intravenous Given 05/14/22 1205)  lactated ringers bolus 1,000 mL (1,000 mLs Intravenous New Bag/Given 05/14/22 1334)  fentaNYL (SUBLIMAZE) injection 50 mcg (50 mcg Intravenous Given 05/14/22 1328)    ED Course/ Medical Decision Making/ A&P Clinical Course as of 05/14/22 1416  Fri May 14, 2022  1338 CBG monitoring, ED(!!) CBG greater than 600. [BM]  1338 I-Stat Venous Blood Gas, ED(!) pH within normal limits, patient does not appear acidotic. [BM]  1338 I-Stat beta hCG blood, ED Pregnancy test negative. [BM]  2683 Basic metabolic panel(!!) BMP shows a glucose of 793, mild gap of 18.  Normal bicarb.  Questionable early DKA.  Hyponatremia of 124 when corrected for hyperglycemia comes out at 141 mill equivalents per liter.  Creatinine elevated at 1.57 appears baseline. [BM]  1340 Urinalysis, Routine w reflex microscopic Urine, Clean Catch(!) 5 ketones present.  Few bacteria and 6-10 WBCs, no leukocytes or nitrites, doubt UTI. [BM]  1340 CBC with Differential (PNL)(!) CBC shows leukocytosis of 15.6 suspected secondary to right foot infection.  No anemia or thrombocytopenia [BM]  1341 DG Foot Complete Right Transmetatarsal amputation with large wound overlying the remainder of the first metatarsal.  No acute fractures or dislocations identified. [BM]  1413 Sedimentation rate(!) ESR elevated 115.  Still awaiting CRP and MRI. [BM]    Clinical Course User Index [BM] Deliah Boston, PA-C  Medical Decision Making 46 year old female presented for hyperglycemia along with wound of the right foot today.  As documented above patient found to be significantly hyperglycemic possible early DKA.  Patient was started on IV fluids along with insulin, potassium and dextrose drip via hyperglycemia order set.  X-ray of the right foot is questionable for osteomyelitis and radiology recommended MRI for further evaluation.  I have added labs including ESR CRP and blood cultures for further  evaluation.  Patient is agreeable to MRI.  Plan to admit to medicine for treatment of hyperglycemia and possible osteomyelitis. - On reevaluation patient is resting comfortably bed, mildly tachycardic otherwise vital signs within normal limits, she is in no acute distress.  Patient is agreeable to admission.  Amount and/or Complexity of Data Reviewed Labs: ordered. Decision-making details documented in ED Course. Radiology: ordered and independent interpretation performed. Decision-making details documented in ED Course.  Risk Prescription drug management. Decision regarding hospitalization.   Patient was seen and evaluated by attending physician Dr. Wyvonnia Dusky during this visit who agrees with management and admission. -------------------- 2:12 PM: Consulted with family medicine, Dr. Adah Salvage accepted patient for admission.  Patient reassessed she is resting comfortably in bed no acute distress vital signs stable.  Patient is receiving IV fluids, IV dextrose, IV potassium and insulin per hyperglycemia order set.  Patient received 1 L IV fluids prior to use of order set so IV fluids were decreased on order set.  She has no additional complaints or concerns at this point  Note: Portions of this report may have been transcribed using voice recognition software. Every effort was made to ensure accuracy; however, inadvertent computerized transcription errors may still be present.         Final Clinical Impression(s) / ED Diagnoses Final diagnoses:  Hyperglycemia  Right foot infection    Rx / DC Orders ED Discharge Orders     None         Gari Crown 05/14/22 1417    Ezequiel Essex, MD 05/14/22 2210    Ezequiel Essex, MD 05/15/22 1011

## 2022-05-14 NOTE — Assessment & Plan Note (Addendum)
Podiatry is following and plan for excisional debridement of the wound. Patient complain of R knee pain that's tender on exam with surroung erythema and edema on the medial aspect. She's currently on broad spectrum antibiotic for osteo, will obtian XR of the knee. -Podiatry Following, appreciate recs - Continue vancomycin, cefipime, Flagyl  -Follow up R knee XR -PO Percocet 5-325 Q6H PRN -PO Tylenol 650 Q6H PRN -Vitals per floor routine

## 2022-05-14 NOTE — Progress Notes (Signed)
PHARMACY ANTIBIOTIC CONSULT NOTE   [name] a 46 y.o. female admitted on 05/14/2022 with hyperglycemia . Patient has a PMH significant for uncontrolled insulin-dependent diabetes, left BKA, right partial foot amputation, and osteomyelitis. Patient has a chronic right foot wound concerning for osteomyelitis on x-ray.  Pharmacy has been consulted for Vancomycin/cefepime dosing.  05/14/2022: Scr 1.63 (BL ~1.1-1.3), LA 5.6,  WBC 15.6  Vital Signs: afebrile, HR elevated, BP elevated  Estimated Creatinine Clearance: 63.8 mL/min (A) (by C-G formula based on SCr of 1.63 mg/dL (H)).  Plan: START Cefepime 2g IV Q8H GIVE Vancomycin 2,500 mg IV x1 (Wt used: 154.2 kg)  THEN Vancomycin 1,250 mg IV Q24H (Scr used: 1.63, Vd used: 0.5, eAUC:487) Continue metronidazole 500 mg IV BID per MD  Monitor renal function, clinical status, de-escalation, C/S, levels as indicated   Allergies:  Allergies  Allergen Reactions   Cefepime Other (See Comments)    AKI, see records from [facility] hospitalization in January 2020.   Other reaction(s): Other (See Comments) "Shut down organs/kidneys"  Note pt has tolerated Rocephin and Keflex   Iodine Other (See Comments)    Kidney dysfunction   Kiwi Extract Shortness Of Breath, Swelling and Anaphylaxis   Morphine And Related Nausea And Vomiting   Nalbuphine Other (See Comments)    Hallucinations     Trental [Pentoxifylline] Nausea And Vomiting   Toradol [Ketorolac Tromethamine] Other (See Comments)    Feels like something is crawling on me   Morphine Nausea And Vomiting   Nubain [Nalbuphine Hcl] Other (See Comments)    "FEELS LIKE SOMETHING CRAWLING ON ME"    Filed Weights   05/14/22 1058  Weight: (!) 154.2 kg (340 lb)       Latest Ref Rng & Units 05/14/2022   12:00 PM 05/14/2022   11:53 AM 05/14/2022   11:25 AM  CBC  WBC 4.0 - 10.5 K/uL   15.6   Hemoglobin 12.0 - 15.0 g/dL 14.3  14.3  12.3   Hematocrit 36.0 - 46.0 % 42.0  42.0  38.9   Platelets 150 - 400 K/uL    341     Antimicrobials this admission: Vancomycin 05/14/2022>>  Cefepime 05/14/2022>> Flagyl 6/9>>  Microbiology results: 05/14/2022 Bcx: sent  Thank you for allowing pharmacy to be a part of this patient's care.  Adria Dill, PharmD PGY-1 Acute Care Resident  05/14/2022 5:20 PM

## 2022-05-14 NOTE — ED Notes (Signed)
Critical Lab results given to PA for this PT.

## 2022-05-14 NOTE — Progress Notes (Signed)
FPTS Brief Progress Note  S: Received page that patient complaining of chest pain and.  Went to bedside rapid response nurse Courtney there.  Patient complained of constant chest pain for last 2 days, does not radiate, no sweating, described as sharp and constant. Pain located in center of chest.   O: BP 114/89 (BP Location: Right Arm)   Pulse (!) 103   Temp 98 F (36.7 C) (Oral)   Resp 20   Ht '5\' 2"'$  (1.575 m)   Wt (!) 154.2 kg   SpO2 97%   BMI 62.19 kg/m   General: Crying, compliant/cooperative Cards: RRR, NRMG, reproducible chest pain in center of chest Pulm: CTABL, no wheezing, comfortable work of breathing, breathing with pursed lips intermittently   A/P: Chest Pain Patient with complaint of chest pain lasting last 2 days.  EKG sinus tach with no ST changes and prolonged Qtc 550.  Given EKG findings on history of chest pain along with reproducible pain on exam, low concern for ACS.  Patient most likely suffering from costochondritis, or MSK etiology.  Patient due for her pain meds, will give pain meds and continue to monitor.  Troponins have been ordered and will be followed up. -f/u Troponin's -Give ordered percocet 5-325 -Continue to monitor  Anxiety Patient emotionally distraught and crying asking for her mother. Patient intermittently breathing through pused lips, but talking in full sentences with normal work of breathing. Would benefit from anxiolytic in acute setting. Last VBG pH 7.425, low concern for respiratory distress. Patient to start CPAP. -Atarax 50 mg x 1 -CPAP  - Orders reviewed. Labs for AM ordered, which was adjusted as needed.    Holley Bouche, MD 05/14/2022, 11:26 PM PGY-1, [facility] Night Resident  Please page [phone] with questions.

## 2022-05-14 NOTE — Assessment & Plan Note (Deleted)
Patient admitted for hyperglycemia with CBG over 600 in at least the last 3 days. Patient report of nausea and NBNB emesis. Initial labs shows CBG xxx BHB , Osmolality xxx, pH xxx, bicarb  xxx,  K+ xxx, and calculated  xxx EKG with sinus tachycardia, no signs of ischemia. Patient home medication include xxx. Labs and history consistent with DKA. Patient received 1L NS and 1.162 L LR in the EDxxx. Will start patient on Endotool, potassium chloride, and D5/LR were started.  -Admit to progressive by FPTS, , attending Dr. Wendy Poet. - Placed NPO -Continue Endotool until AG closed x 2. -q1h CBG monitoring while on Endotool. Transition to meal and bedtime monitoring once Endotool complete -q4h BMP; replete K+ as appropriate -Continue LR at 125 ml/hr while CBG >250. -Start D5/LR at 125 ml/hr when CBG <250 -Continue potassium chloride 10 mEq IV x 2 doses -Consult to diabetes coordinator placed

## 2022-05-14 NOTE — H&P (Cosign Needed Addendum)
Hospital Admission History and Physical Service Pager: [phone]  Patient name: [name] Medical record number: [id number] Date of Birth: 07/27/1976 Age: 46 y.o. Gender: female  Primary Care Provider: [name], MD Consultants: None Code Status: Full    Preferred Emergency Contact:  Guinevere Ferrari ([phone]   Chief Complaint: Hyperglycemia  Assessment and Plan: [name] is a 46 y.o. female presenting with hyperosmolar hyperglycemic state in type 2 diabetes.  PMH is significant for insulin-dependent type 2 diabetes with neuropathy, COPD, AKA of left lower extremity, CKD 3, depression, bipolar 2 disorder and OSA on BiPAP.  * Type 2 diabetes mellitus with hyperosmolar hyperglycemic state (HHS) ([place]) Patient report hyperglycemia with CBG over 600 in at least the last week. Patient report polyuria, polydipsia and nausea without emesis.  Initial labs show CBG 793, pH 7.45, anion gap 18, bicarb 28.5, and NA 124.  Exam patient has Kussmaul breathing and dry heaving in the setting of nausea.  Presentations and lab findings are consistent with hyperosmolar hyperglycemic state in the setting of type 2 diabetes.  EKG with sinus tachycardia, no signs of ischemia. Patient home medication include Tresiba 26 units twice daily, and Trulicity 3 mg weekly. Patient received 1L NS and 1.162 L LR in the ED. Will start patient on Endotool, potassium chloride, and LR were started.  -Admit to progressive by FPTS, attending Dr. Wendy Poet. - Placed NPO -Continue Endotool until osmolality normalizes -Continue maintenance IV fluid per endotool -q1h CBG monitoring while on Endotool. Transition to meal and bedtime monitoring once Endotool complete -q4h BMP; replete K+ as appropriate -Continue LR at 125 ml/hr while CBG >250. -Start D5/LR at 125 ml/hr when CBG <250 -Continue potassium chloride 10 mEq IV x 2 doses -Consult to diabetes coordinator placed  Cellulitis of right  foot Patient presents with nonhealing diabetic right foot.  She reports ongoing foot wound and endorses pain on the foot.  Vital signs show she is tachycardic but afebrile.  initial labs on admission leukocytosis with white blood count of 15.6, CRP 22.6 and lactic acid 3.3.  On exam patient's right foot with shows nonhealing wound with surrounding erythema and edema. Foot xray shows a large soft tissue ulcer consistent with cellulitis and loss of cortication of the first metatarsal stump which is suspicious for osteomyelitis.  Radiology recommend follow-up MRI.  Right has been ordered in the ED -Follow-up MRI -Vitals per floor routine -Start vancomycin, cefipime, Flagyl  -Continue maintenance IV fluid -Trend lactic acids -Follow-up blood culture -PO Percocet 5-325 Q6H PRN -PO Tylenol 650 Q6H PRN    AKI (acute kidney injury) ([place]) Creatinine on admission is 1.57 and baseline 1.3-1.4. BUN and GFR were 37 and 67 respectively. Her AKI is likely prerenal in the setting of dehydration secondary to osmolar hyper glycemic state.  Continue fluid resuscitation and monitor kidney function with daily BMP labs. -Avoid nephrotoxic agent -Obtain BMP lab in the AM  Diastolic HF (heart failure) ([place]) Patient on admission appear fluid depleted.  Her cardiac and pulmonary exam were normal.  -Will hold home torsemide -Monitor daily weights -Monitor intake and output.  OSA (obstructive sleep apnea) On home BiPAP nightly -continue nightly BiPAP   Other chronic and stable condition: COPD Depression Chronic pain - resume home oxycodone-APAP, gabapentin, and duloxetine  FEN/GI: npo, restart diet once off of insulin gtt VTE Prophylaxis: Levonox  Disposition: pending medical workup  History of Present Illness:  [name] is a 46 y.o. female presenting with hypoglycemia.  Patient is  a poor historian and nurse reports patient got IV fentanyl for pain within the last hour prior to our encounter.   Patient reports home blood glucose has been in the 600s for the last week.  In addition endorses polydipsia, polyuria and nausea but no vomiting in the last week.  She recently saw her endocrinologist 3 days ago for routine follow-up.  Her blood sugars were elevated and she was advised to go to the ED, however patient says she went home because she was asymptomatic and feeling fine at the time.  Patient reports she continued to have worsening nausea and not feeling too well which prompted her to come to the ED today. Patient couldn't expand on what she meant by not feeling well.  Denies chest pains, fevers, chills, or headache.  Reports normal bowel movements and denies any dysuria. She does report that her right foot hurts.  In the ED, patient's CBGs were over 700 and she was started on Endotool.  Right foot x-ray was obtained for nonhealing foot wound which showed possible osteomyelitis and Radiology recommend follow up MRI which has been ordered.   Review Of Systems: Per HPI with the following additions:  Review of Systems  Constitutional:  Negative for chills and fever.  HENT: Negative.    Cardiovascular: Negative.   Gastrointestinal:  Positive for nausea. Negative for constipation, diarrhea and vomiting.  Skin: Negative.   Neurological: Negative.      Pertinent Past Medical History: Insulin-dependent type 2 diabetes Diabetic neuropathy Diabetic foot-right foot cellulitis HLD HTN COPD OSA on BiPAP Depression Bipolar 2 disorder  Remainder reviewed in history tab.   Pertinent Past Surgical History: Remainder reviewed in history tab.   Pertinent Family History: Reviewed in history tab.   Important Outpatient Medications: Reviewed in medication history.   Objective: BP (!) 138/48   Pulse (!) 103   Temp 98.4 F (36.9 C) (Oral)   Resp 18   Ht '5\' 2"'$  (1.575 m)   Wt (!) 154.2 kg   SpO2 93%   BMI 62.19 kg/m  Exam: General: mildly altered, intermittent grunting, NAD HEENT:  Atraumatic, MMM, No sclera icterus CV: RRR, no murmurs, normal S1/S2 Pulm: CTAB, good WOB on RA, Notable Kussmaul breathing Abd: Soft, no distension, no tenderness Skin: dry, warm Ext: LLE AKA, right foot with nonhealing wound showing surrounding edema and erythema and tender to touch.    Labs:  CBC BMET  Recent Labs  Lab 05/14/22 1125 05/14/22 1153 05/14/22 1200  WBC 15.6*  --   --   HGB 12.3   < > 14.3  HCT 38.9   < > 42.0  PLT 341  --   --    < > = values in this interval not displayed.   Recent Labs  Lab 05/14/22 1538  NA 128*  K 3.7  CL 87*  CO2 24  BUN 36*  CREATININE 1.63*  GLUCOSE 638*  CALCIUM 8.4*      EKG: Not obtained.   Imaging Studies Performed:  Imaging Study (ie. Chest x-ray) Impression from Radiologist:  DG Foot Complete Right  Result Date: 05/14/2022 CLINICAL DATA:  Diabetic foot ulcer EXAM: RIGHT FOOT COMPLETE - 3+ VIEW COMPARISON:  RIGHT foot XRs, 12/18/2021. FINDINGS: Transmetatarsal amputations at the RIGHT foot. Enlarged soft tissue defect at the superomedial margin of the stump. Question demineralization and loss of cortication at the first metatarsal stump. 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 IMPRESSION: Enlarged soft tissue ulcer with question of demineralization and  loss of cortication at the first metatarsal stump. Findings suspicious for osteomyelitis. Consider MR for further evaluation. Electronically Signed   By: Michaelle Birks M.D.   On: 05/14/2022 12:01      My Interpretation: Right foot cellulitis with possible osteomyelitis.   Alen Bleacher, MD 05/14/2022, 5:50 PM PGY-1, [place] Intern pager: [phone], text pages welcome Secure chat group [place]   I was personally present and performed or re-performed the history, physical exam and medical decision making activities of this service and have verified that the service and findings are  accurately documented in the resident's note.  Zola Button, MD                  05/14/2022, 6:12 PM

## 2022-05-14 NOTE — ED Notes (Signed)
Patient transported to X-ray 

## 2022-05-14 NOTE — Progress Notes (Addendum)
Rapid Response at bedside

## 2022-05-14 NOTE — ED Triage Notes (Signed)
Pt BIB [facility] from ome c/o hyperglycemia x3 days. Pt states she has a CNA that comes to help and her CBG is over 600. Pt is c/o weakness and left leg pain today as well.

## 2022-05-14 NOTE — Assessment & Plan Note (Deleted)
Patient presents with nonhealing xxx toe wound.  Vital signs in the ED showed xxx. Labs on admission xxx . xxx  foot xray shows findings suspicious for osteomyelitis/septic arthritis involving the second distal interphalangeal joint

## 2022-05-14 NOTE — ED Notes (Signed)
I didn't have any success getting patient blood. 

## 2022-05-14 NOTE — Progress Notes (Signed)
This nurse was admitting another patient during this patients arrival to unit. She is alert, verbal. Epic updated upon seeing patient @ that time CBG 438.

## 2022-05-14 NOTE — Assessment & Plan Note (Deleted)
Creatinine this morning was 1.89 -Encorauged PO fluid intake -Continue to monitor kidney function with daily BMP labs. -Avoid nephrotoxic agents

## 2022-05-14 NOTE — Inpatient Diabetes Management (Addendum)
Inpatient Diabetes Program Recommendations  AACE/ADA: New Consensus Statement on Inpatient Glycemic Control (2015)  Target Ranges:  Prepandial:   less than 140 mg/dL      Peak postprandial:   less than 180 mg/dL (1-2 hours)      Critically ill patients:  140 - 180 mg/dL   Lab Results  Component Value Date   GLUCAP >600 (HH) 05/14/2022   HGBA1C >15.5 (H) 12/19/2021    Review of Glycemic Control  Latest Reference Range & Units 05/14/22 11:24  Glucose-Capillary 70 - 99 mg/dL >600 (HH)  (HH): Data is critically high  Diabetes history: DM2 Outpatient Diabetes medications: Tresiba 30 units BID, Novolog 30 units TID with meals, Trulicity 3 mg weekly-Sundays, Dexcom; Omnipod for boluses only Pump settings:  Omnipod 5, using manual mode and only using for bolusing with meals.  Basal rates - 12am 0 u/h, 9am 0.05 units/hr, 10am 0 units/hr TBD = 0.05 units Insulin to carbohydrate ratios - 12am-12am 15 Insulin sensitivity factor - 12am-12am 50 Active insulin duration = 3 hours BG targets = 12am-12am 130 mg/dl Correct Above 140 mg/dl TDD 41.4 units daily (bolus)  Current orders for Inpatient glycemic control: IVF  Spoke with patient in ED at bedside briefly as she is feeling very unwell.  Confirms above home medications.  She states her blood sugar has been >600 mg/dL the last 3 days despite taking her insulins as prescribed.  She is current with endocrinologist, Dr. Norma Fredrickson with [facility].  Last visit was 05/11/22.  Had partial right foot amputation in January.    Per Endo note on 05/11/22- Patient Instructions  Increasing Tresiba 40 units twice daily.  Take Novolog 40 units before meals.  Continue sliding scale.  Switch to Ozempic 1 mg weekly. Then stop Trulicity.   She removed her Omnipod while I was at bedside and catheter was completely bent.  She states she changed it yesterday because her blood sugars weren't coming down.  She also has her Dexcom on her left abdomen.  Please do not throw  away Dexcom if it needs to be removed for imaging; the transmitter is very expensive.    Recommend IV Insulin  Will continue to follow while inpatient.  Thank you, Reche Dixon, MSN, [place] Diabetes Coordinator Inpatient Diabetes Program [phone] (team pager from 8a-5p)

## 2022-05-14 NOTE — Assessment & Plan Note (Deleted)
Creatinine on admission is  xxx and baseline xxx. BUN and GFR were xxx respectively.  -Avoid nephrotoxic agent -Hold medication xxx -Obtain BMP lab in the AM xxx

## 2022-05-15 ENCOUNTER — Encounter (HOSPITAL_COMMUNITY): Payer: Self-pay | Admitting: Family Medicine

## 2022-05-15 ENCOUNTER — Inpatient Hospital Stay (HOSPITAL_COMMUNITY): Payer: Medicaid Other

## 2022-05-15 DIAGNOSIS — L0390 Cellulitis, unspecified: Secondary | ICD-10-CM | POA: Diagnosis not present

## 2022-05-15 DIAGNOSIS — B3731 Acute candidiasis of vulva and vagina: Secondary | ICD-10-CM

## 2022-05-15 DIAGNOSIS — M79661 Pain in right lower leg: Secondary | ICD-10-CM

## 2022-05-15 DIAGNOSIS — I739 Peripheral vascular disease, unspecified: Secondary | ICD-10-CM | POA: Insufficient documentation

## 2022-05-15 DIAGNOSIS — E1100 Type 2 diabetes mellitus with hyperosmolarity without nonketotic hyperglycemic-hyperosmolar coma (NKHHC): Secondary | ICD-10-CM | POA: Diagnosis not present

## 2022-05-15 HISTORY — DX: Acute candidiasis of vulva and vagina: B37.31

## 2022-05-15 LAB — BASIC METABOLIC PANEL
Anion gap: 12 (ref 5–15)
Anion gap: 15 (ref 5–15)
Anion gap: 13 (ref 5–15)
BUN: 36 mg/dL — ABNORMAL HIGH (ref 6–20)
BUN: 37 mg/dL — ABNORMAL HIGH (ref 6–20)
BUN: 42 mg/dL — ABNORMAL HIGH (ref 6–20)
CO2: 27 mmol/L (ref 22–32)
CO2: 23 mmol/L (ref 22–32)
CO2: 26 mmol/L (ref 22–32)
Calcium: 8.6 mg/dL — ABNORMAL LOW (ref 8.9–10.3)
Calcium: 8.4 mg/dL — ABNORMAL LOW (ref 8.9–10.3)
Calcium: 8.4 mg/dL — ABNORMAL LOW (ref 8.9–10.3)
Chloride: 94 mmol/L — ABNORMAL LOW (ref 98–111)
Chloride: 94 mmol/L — ABNORMAL LOW (ref 98–111)
Chloride: 93 mmol/L — ABNORMAL LOW (ref 98–111)
Creatinine, Ser: 1.62 mg/dL — ABNORMAL HIGH (ref 0.44–1.00)
Creatinine, Ser: 1.7 mg/dL — ABNORMAL HIGH (ref 0.44–1.00)
Creatinine, Ser: 1.8 mg/dL — ABNORMAL HIGH (ref 0.44–1.00)
GFR, Estimated: 40 mL/min — ABNORMAL LOW (ref >60)
GFR, Estimated: 38 mL/min — ABNORMAL LOW (ref >60)
GFR, Estimated: 35 mL/min — ABNORMAL LOW (ref >60)
Glucose, Bld: 194 mg/dL — ABNORMAL HIGH (ref 70–99)
Glucose, Bld: 174 mg/dL — ABNORMAL HIGH (ref 70–99)
Glucose, Bld: 296 mg/dL — ABNORMAL HIGH (ref 70–99)
Potassium: 3.4 mmol/L — ABNORMAL LOW (ref 3.5–5.1)
Potassium: 3.8 mmol/L (ref 3.5–5.1)
Potassium: 3.8 mmol/L (ref 3.5–5.1)
Sodium: 133 mmol/L — ABNORMAL LOW (ref 135–145)
Sodium: 132 mmol/L — ABNORMAL LOW (ref 135–145)
Sodium: 132 mmol/L — ABNORMAL LOW (ref 135–145)

## 2022-05-15 LAB — CBC WITH DIFFERENTIAL/PLATELET
Abs Immature Granulocytes: 0 10*3/uL (ref 0.00–0.07)
Basophils Absolute: 0 10*3/uL (ref 0.0–0.1)
Basophils Relative: 0 %
Eosinophils Absolute: 0 10*3/uL (ref 0.0–0.5)
Eosinophils Relative: 0 %
HCT: 37.6 % (ref 36.0–46.0)
Hemoglobin: 12.3 g/dL (ref 12.0–15.0)
Lymphocytes Relative: 22 %
Lymphs Abs: 3.4 10*3/uL (ref 0.7–4.0)
MCH: 28.9 pg (ref 26.0–34.0)
MCHC: 32.7 g/dL (ref 30.0–36.0)
MCV: 88.5 fL (ref 80.0–100.0)
Monocytes Absolute: 1.2 10*3/uL — ABNORMAL HIGH (ref 0.1–1.0)
Monocytes Relative: 8 %
Neutro Abs: 10.9 10*3/uL — ABNORMAL HIGH (ref 1.7–7.7)
Neutrophils Relative %: 70 %
Platelets: 277 10*3/uL (ref 150–400)
RBC: 4.25 MIL/uL (ref 3.87–5.11)
RDW: 14.1 % (ref 11.5–15.5)
WBC: 15.5 10*3/uL — ABNORMAL HIGH (ref 4.0–10.5)
nRBC: 0 % (ref 0.0–0.2)
nRBC: 0 /100 WBC

## 2022-05-15 LAB — GLUCOSE, CAPILLARY
Glucose-Capillary: 199 mg/dL — ABNORMAL HIGH (ref 70–99)
Glucose-Capillary: 198 mg/dL — ABNORMAL HIGH (ref 70–99)
Glucose-Capillary: 198 mg/dL — ABNORMAL HIGH (ref 70–99)
Glucose-Capillary: 214 mg/dL — ABNORMAL HIGH (ref 70–99)
Glucose-Capillary: 165 mg/dL — ABNORMAL HIGH (ref 70–99)
Glucose-Capillary: 183 mg/dL — ABNORMAL HIGH (ref 70–99)
Glucose-Capillary: 180 mg/dL — ABNORMAL HIGH (ref 70–99)
Glucose-Capillary: 212 mg/dL — ABNORMAL HIGH (ref 70–99)
Glucose-Capillary: 216 mg/dL — ABNORMAL HIGH (ref 70–99)
Glucose-Capillary: 346 mg/dL — ABNORMAL HIGH (ref 70–99)
Glucose-Capillary: 353 mg/dL — ABNORMAL HIGH (ref 70–99)
Glucose-Capillary: 396 mg/dL — ABNORMAL HIGH (ref 70–99)

## 2022-05-15 LAB — LACTIC ACID, PLASMA: Lactic Acid, Venous: 2.4 mmol/L (ref 0.5–1.9)

## 2022-05-15 LAB — BRAIN NATRIURETIC PEPTIDE: B Natriuretic Peptide: 32.7 pg/mL (ref 0.0–100.0)

## 2022-05-15 LAB — TROPONIN I (HIGH SENSITIVITY)
Troponin I (High Sensitivity): 36 ng/L — ABNORMAL HIGH (ref <18)
Troponin I (High Sensitivity): 52 ng/L — ABNORMAL HIGH (ref <18)
Troponin I (High Sensitivity): 46 ng/L — ABNORMAL HIGH (ref <18)

## 2022-05-15 LAB — MRSA NEXT GEN BY PCR, NASAL: MRSA by PCR Next Gen: NOT DETECTED

## 2022-05-15 MED ORDER — INSULIN GLARGINE-YFGN 100 UNIT/ML ~~LOC~~ SOLN
25.0000 [IU] | Freq: Two times a day (BID) | SUBCUTANEOUS | Status: DC
Start: 2022-05-15 — End: 2022-05-16
  Administered 2022-05-15 – 2022-05-16 (×2): 25 [IU] via SUBCUTANEOUS
  Filled 2022-05-15 (×3): qty 0.25

## 2022-05-15 MED ORDER — VANCOMYCIN HCL 1500 MG/300ML IV SOLN
1500.0000 mg | INTRAVENOUS | Status: DC
Start: 2022-05-16 — End: 2022-05-16
  Filled 2022-05-15: qty 300

## 2022-05-15 MED ORDER — OXYCODONE-ACETAMINOPHEN 7.5-325 MG PO TABS
1.0000 | ORAL_TABLET | Freq: Four times a day (QID) | ORAL | Status: DC | PRN
  Administered 2022-05-16 – 2022-05-24 (×9): 1 via ORAL
  Filled 2022-05-15 (×9): qty 1

## 2022-05-15 MED ORDER — INSULIN ASPART 100 UNIT/ML IJ SOLN: 0.0000 [IU] | Freq: Three times a day (TID) | INTRAMUSCULAR | Status: DC

## 2022-05-15 MED ORDER — LACTATED RINGERS IV SOLN: INTRAVENOUS | Status: DC

## 2022-05-15 MED ORDER — CEFEPIME HCL 2 G IV SOLR
2.0000 g | Freq: Two times a day (BID) | INTRAVENOUS | Status: DC
Start: 2022-05-15 — End: 2022-05-20
  Administered 2022-05-15 – 2022-05-19 (×9): 2 g via INTRAVENOUS
  Filled 2022-05-15 (×9): qty 12.5

## 2022-05-15 MED ORDER — VANCOMYCIN HCL 10 G IV SOLR
2500.0000 mg | Freq: Once | INTRAVENOUS | Status: AC
  Administered 2022-05-15: 2500 mg via INTRAVENOUS
  Filled 2022-05-15: qty 25
  Filled 2022-05-15: qty 2500

## 2022-05-15 MED ORDER — OXYCODONE HCL 5 MG PO TABS
15.0000 mg | ORAL_TABLET | ORAL | Status: AC
  Administered 2022-05-15: 15 mg via ORAL
  Filled 2022-05-15: qty 3

## 2022-05-15 MED ORDER — INSULIN GLARGINE-YFGN 100 UNIT/ML ~~LOC~~ SOLN
25.0000 [IU] | Freq: Every day | SUBCUTANEOUS | Status: DC
  Administered 2022-05-15: 25 [IU] via SUBCUTANEOUS
  Filled 2022-05-15: qty 0.25

## 2022-05-15 MED ORDER — ENOXAPARIN SODIUM 80 MG/0.8ML IJ SOSY
80.0000 mg | PREFILLED_SYRINGE | INTRAMUSCULAR | Status: DC
  Administered 2022-05-15 – 2022-05-25 (×10): 80 mg via SUBCUTANEOUS
  Filled 2022-05-15 (×11): qty 0.8

## 2022-05-15 MED ORDER — INSULIN ASPART 100 UNIT/ML IJ SOLN
0.0000 [IU] | Freq: Three times a day (TID) | INTRAMUSCULAR | Status: DC
  Administered 2022-05-15 – 2022-05-16 (×3): 20 [IU] via SUBCUTANEOUS
  Administered 2022-05-16: 15 [IU] via SUBCUTANEOUS
  Administered 2022-05-16: 20 [IU] via SUBCUTANEOUS
  Administered 2022-05-17: 15 [IU] via SUBCUTANEOUS

## 2022-05-15 NOTE — Progress Notes (Signed)
Rapid Response Event Note   Reason for Call :  Chest pain  Initial Focused Assessment:   Pt alert crying and c/o pain in her leg and wanting to call her mother, pt reports chest pain after being asked but unable to discribe. SpO2 97% [place] 2L, HR 103 ST, BP 114/89.    Interventions:   EKG-ST Family medicine to bedside PRN pain med given    Plan of Care:  Pt placed on HS CPAP, treat pain, treat anxiety, call RRT RN if further assistance needed.     Devota Pace, RN

## 2022-05-15 NOTE — Progress Notes (Signed)
FPTS Brief Progress Note  S: Patient sleeping with bipap on. Most recent BMP shows AG closed, glucose 194, down from 329. Total IV insulin given since CBGs reached 300s is 52, will give long acting subcu insulin of 25U when patient awakens to eat.    O: BP (!) 101/50 (BP Location: Right Arm)   Pulse (!) 106   Temp 97.8 F (36.6 C) (Axillary)   Resp 16   Ht '5\' 2"'$  (1.575 m)   Wt (!) 154.2 kg   SpO2 95%   BMI 62.19 kg/m     A/P: - To come off endotool: patient to get semglee 25U, keep endotool on for 2 hours after patient gets subcutaneous insulin and eats (hopefully can be done around 5 AM).  - STOP D5 IVF when patient comes off endotool and start LR IVF - Carb modified diet - Orders reviewed. Labs for AM ordered, which was adjusted as needed.   Gladys Damme, MD 05/15/2022, 2:45 AM PGY-3, [facility] [facility] Night Resident  Please page [phone] with questions.

## 2022-05-15 NOTE — Hospital Course (Addendum)
[name] is a 46 y.o. female who was admitted to the [facility] Teaching Service at [facility] for hyperglycemic hyperosmolar state. Hospital course is outlined below by system.    Hyperglycemic hyperosmolar state Patient presented for admission after she reported BG in 600s all week. On admission her CBG was 738 and she complained of polyuria, polydipsia and nausea with no emesis.  Her initial lab findings showed pH 7.45, anion gap 18, bicarb 28.5 and NA 124 consistent with type 2 diabetes with hyperosmolar hyperglycemic state.  She was started on fluid resuscitation and Endo tool.  CBGs were monitored closely per Endo tool protocol and eventually transitioned to subcutaneous basal insulin.  At discharge patient was stable on 20 units 3 times daily short acting insulin with meals, and 50 units twice daily of basal insulin.   Amputation stump infection, right foot ([place]) Patient presents with nonhealing diabetic right foot. Her initial labs on admission showed leukocytosis with white blood count of 15.6, CRP 22.6 and lactic acid 3.3.  Foot right x-ray showed possible osteomyelitis prompting follow-up MRI which confirmed osteomyelitis. Managed initially with IV vancomycin, cefepime and Flagyl until Podiatry was consulted who performed R foot excisional debridement on 6/16. After her procedure patient was transitioned to Augmentin which she was to take for 7 days (Last day 6/23). She is to be nonweightbearing and follow up with podiatry outpatient  Right knee pain  During hospitalization patient was complaining of right knee pain.  On exam was found to have surrounding erythema and edema.  Right knee x-ray was consistent with severe osteoarthritis of the knee and presentation was concerning for gout and patient was treated with colchicine and her home pain medication.  Colchicine was discontinued at discharge and she could benefit from physical therapy.  PCP follow-up recommendations Home torsemide was  held during hospitalization due to AKI.  Consider restarting when appropriate Reduce Gabapentin to 900 TID Continue Augmentin with last day scheduled for 6/23 Started Losartan 50 mg daily for BP Diabetes regimen: 45u Tresiba BID and 20u Novolog TID before meals. Should check CBG QAC and QHS

## 2022-05-15 NOTE — Progress Notes (Signed)
PHARMACY NOTE:  ANTIMICROBIAL RENAL DOSAGE ADJUSTMENT  Current antimicrobial regimen includes a mismatch between antimicrobial dosage and estimated renal function.  As per policy approved by the Pharmacy & Therapeutics and Medical Executive Committees, the antimicrobial dosage will be adjusted accordingly.  Current antimicrobial dosage: Cefepime 2G IV Q8H  Indication: Cellulitis  Renal Function:  Estimated Creatinine Clearance: 57.7 mL/min (A) (by C-G formula based on SCr of 1.8 mg/dL (H)). '[]'$      On intermittent HD, scheduled: '[]'$      On CRRT    Antimicrobial dosage has been changed to:  Cefepime 2G IV Q12H  Additional comments: Monitor renal function for improvement to adjust frequency as needed.    Thank you for allowing pharmacy to be a part of this patient's care.  Cephus Slater, PharmD, MBA, [facility] Resident [phone] 05/15/2022 5:33 PM

## 2022-05-15 NOTE — Progress Notes (Addendum)
Arrived at patient's bedside after I was notified by nurse that patient refused to get lab draws.  Informed patient's because of her orthopnea during the MRI attempt and history of heart failure we ordered lab to explore possible causes of orthopnea and this include BNP. Patient is a poor historian and unable to tell me how long she's been experiencing orthopnea. However she's agreeable to Lab draws and there is low threshold to get a CXR. Will continue to monitor and follow up with her labs.   Alen Bleacher, MD PGY-1, [facility] Resident  Please page [phone] with questions.

## 2022-05-15 NOTE — Progress Notes (Signed)
Inpatient Diabetes Program Recommendations  AACE/ADA: New Consensus Statement on Inpatient Glycemic Control (2015)  Target Ranges:  Prepandial:   less than 140 mg/dL      Peak postprandial:   less than 180 mg/dL (1-2 hours)      Critically ill patients:  140 - 180 mg/dL   Lab Results  Component Value Date   GLUCAP 212 (H) 05/15/2022   HGBA1C >15.5 (H) 12/19/2021    Review of Glycemic Control  Diabetes history: DM2  Outpatient Diabetes medications: Tresiba 30 units TID, Novolog 30 units TID, Trulicity 3 mg weekly + Omnipod insulin pump for additional boluses, Dexcom CGM  Current orders for Inpatient glycemic control: IV Insulin, received Semglee 25 units at 06:31  Inpatient Diabetes Program Recommendations:    Referral received for DM management.  Last drip rate was 11.5 units/hr.  [place] is well known to the inpatient diabetes team.  Spoke with her yesterday at bedside in ED.  Given high drip rates, might consider continuing IV insulin.  When MD is ready to transition to SQ insulin, please consider;  1-Semglee 30 units BID (will likely need to be increased) 2-Novolog 0-20 units Q4H 3-Novolog 15 units TID with meals if consumes at least 50% (with diet order) 4-Carb modified diet  Will continue to follow while inpatient.  Thank you, Reche Dixon, MSN, [place] Diabetes Coordinator Inpatient Diabetes Program [phone] (team pager from 8a-5p)

## 2022-05-15 NOTE — Progress Notes (Signed)
VASCULAR LAB    Right lower extremity venous duplex has been performed.  See CV proc for preliminary results.   Hazel Leveille, RVT 05/15/2022, 10:47 AM

## 2022-05-15 NOTE — Progress Notes (Signed)
PT Cancellation Note  Patient Details Name: [name] MRN: [id number] DOB: [birth year]   Cancelled Treatment:    Reason Eval/Treat Not Completed: Patient not medically ready  Awaiting doppler of RLE to rule out DVT. Will follow and proceed with mobility/PT eval as appropriate.   [place]  Office [phone]  Rexanne Mano 05/15/2022, 10:16 AM

## 2022-05-15 NOTE — Progress Notes (Signed)
PT Cancellation and discharge Note  Patient Details Name: [name] MRN: [id number] DOB: 03-Jan-1976   Cancelled Treatment:    Reason Eval/Treat Not Completed: Other (comment)  Patient declined PT services.  Reports she is at baseline, didn't bring her prothesis and doesn't need PT services.  Will sign off.   Please reconsult is patient has needs and changes her mind.  Thank you,  Shanna Cisco 05/15/2022, 3:14 PM

## 2022-05-15 NOTE — Progress Notes (Signed)
Pt has refused dulera at this time.  Pt states she does not want to go on cpap at this time although she was sleeping when RT arrived.  Pt states she will call when she is ready to be placed on cpap.  RT will cont to mont

## 2022-05-15 NOTE — Progress Notes (Addendum)
Daily Progress Note Intern Pager: [phone]  Patient name: [name] Medical record number: [id number] Date of birth: [birth year] Age: 46 y.o. Gender: female  Primary Care Provider: [name], MD Consultants: None Code Status: Full  Pt Overview and Major Events to Date:  6/9: Admitted  Assessment and Plan:  [name] is a 46 year old female admitted for hyperosmolar hyperglycemic state in the setting of type 2 diabetes.  Pertinent PMH/PSH includes insulin-dependent type 2 diabetes with neuropathy, COPD, AKA of left lower extremity, CKD 3, depression, bipolar 2 disorder, and OSA on BiPAP.  * Type 2 diabetes mellitus with hyperosmolar hyperglycemic state (HHS) ([place]) Patient's CBG this morning has ranged from 165-216.  Anion gap is closed x2 and has transition from 2 to subcutaneous insulin with a 2hr overlap. She is currently feeding and tolerating well. Still have Kussmaul breathing but endorses feeling much better -Discontinued Endo tool -Started on 25u Semglee -Continue maintenance IV fluid -Moderate sliding scale insulin -Placed on carb modified diet  Amputation stump infection, right foot ([place]) Patient with non healing right diabetic foot ulcer. On admission have leukocytosis and Foot Xray showing possible osteomylitis. Will get her R foot MRI today. Blood culture showed no growth in less than 24 hours. Patient complain of R popliteal pain this morning , RLE was warm to touch but will obtain ABI ro rule out any Peripheral vascular process. -Follow-up MRI -Follow up ABI -Vitals per floor routine - Continue vancomycin, cefipime, Flagyl  -Follow-up blood culture -PO Percocet 5-325 Q6H PRN -PO Tylenol 650 Q6H PRN    AKI (acute kidney injury) ([name]) Creatinine on admission is 1.57 and today is 1.70. Baseline is 1.3-1.4. Continue fluid resuscitation and monitor kidney function with daily BMP labs. -Avoid nephrotoxic agent -Obtain BMP lab in the  AM  Diastolic HF (heart failure) ([name]) So sings of volume overlaod.  Her cardiac and pulmonary exam were normal.  -Will hold home torsemide -Monitor daily weights -Monitor intake and output.  Obstructive sleep apnea (adult) (pediatric) On home BiPAP nightly. -continue nightly BiPAP    FEN/GI: Carb modified diet PPx: Lovenox Dispo:Home pending clinical improvement . Barriers include clinical status.   Subjective:  [name] says she is feeling much better this morning. Say she's having a bit of tremor but that is chronic and she's always had that. Denies alcohol use or any drug use.   Objective: Temp:  [97.8 F (36.6 C)-99.3 F (37.4 C)] 97.8 F (36.6 C) (06/10 0812) Pulse Rate:  [92-115] 92 (06/10 0812) Resp:  [11-20] 20 (06/10 0346) BP: (101-138)/(48-89) 115/52 (06/10 0812) SpO2:  [91 %-100 %] 92 % (06/10 0812) Physical Exam: General: Alert, well appearing, NAD CV: RRR, no murmurs, normal S1/S2 Pulm: CTAB, good WOB on RA, no crackles or wheezing Abd: Soft, obessed, no tenderness, +BS Skin: dry, warm Ext: L AKA, R with non healing wound, tender to touch with improving peripheral erythema     Laboratory: Most recent CBC Lab Results  Component Value Date   WBC 15.5 (H) 05/15/2022   HGB 12.3 05/15/2022   HCT 37.6 05/15/2022   MCV 88.5 05/15/2022   PLT 277 05/15/2022   Most recent BMP    Latest Ref Rng & Units 05/15/2022    3:51 AM  BMP  Glucose 70 - 99 mg/dL 174   BUN 6 - 20 mg/dL 37   Creatinine 0.44 - 1.00 mg/dL 1.70   Sodium 135 - 145 mmol/L 132   Potassium 3.5 - 5.1 mmol/L  3.8   Chloride 98 - 111 mmol/L 94   CO2 22 - 32 mmol/L 23   Calcium 8.9 - 10.3 mg/dL 8.4     Imaging/Diagnostic Tests: No new images   Belinda Bleacher, MD 05/15/2022, 11:59 AM  PGY-1, [place] Intern pager: [phone], text pages welcome Secure chat group [place]

## 2022-05-15 NOTE — Progress Notes (Signed)
OT Cancellation Note  Patient Details Name: Macil Crady MRN: [id number] DOB: [birth year]   Cancelled Treatment:    Reason Eval/Treat Not Completed: Medical issues which prohibited therapy Awaiting results of doppler to rule out DVT, OT will follow back as time permits.   Corinne Ports E. Cathye Kreiter, OTR/L Acute Rehabilitation Services [phone]   Ascencion Dike 05/15/2022, 12:06 PM

## 2022-05-15 NOTE — Progress Notes (Signed)
Patient is refusing lab draws, Dr., Adah Salvage made aware.

## 2022-05-15 NOTE — Progress Notes (Signed)
FPTS Brief Progress Note  S:Patient sleeping comfortably in the bed. Discussed care with nurse who reports no concerns at this time and that patient has done well tonight so far.    O: BP 101/62 (BP Location: Left Arm)   Pulse 77   Temp 97.7 F (36.5 C) (Oral)   Resp 20   Ht '5\' 2"'$  (1.575 m)   Wt (!) 154.2 kg   SpO2 97%   BMI 62.19 kg/m   General: NAD, sleeping in bed Respiratory: breathing comfortably with no retractions  A/P: Concern for osteomyelitis Patient unable to tolerate MRI during the day due to orthopnea. Will need to consider if Ativan pre-treatment necessary prior to imaging if there is more concern for anxiety and not true pulmonary etiology.   Type 2 diabetes with HHS - CBGs remain elevated, patient received 2nd dose of 25 Units of basal insulin this evening.  - Orders reviewed. Labs for AM ordered, which was adjusted as needed.   Rise Patience, DO 05/15/2022, 11:44 PM PGY-2, [facility] Night Resident  Please page [phone] with questions.

## 2022-05-16 DIAGNOSIS — S88119A Complete traumatic amputation at level between knee and ankle, unspecified lower leg, initial encounter: Secondary | ICD-10-CM | POA: Diagnosis not present

## 2022-05-16 DIAGNOSIS — T8740 Infection of amputation stump, unspecified extremity: Secondary | ICD-10-CM

## 2022-05-16 DIAGNOSIS — N179 Acute kidney failure, unspecified: Secondary | ICD-10-CM | POA: Diagnosis not present

## 2022-05-16 DIAGNOSIS — E1100 Type 2 diabetes mellitus with hyperosmolarity without nonketotic hyperglycemic-hyperosmolar coma (NKHHC): Secondary | ICD-10-CM | POA: Diagnosis not present

## 2022-05-16 LAB — CBC
HCT: 33.2 % — ABNORMAL LOW (ref 36.0–46.0)
Hemoglobin: 10.8 g/dL — ABNORMAL LOW (ref 12.0–15.0)
MCH: 29.3 pg (ref 26.0–34.0)
MCHC: 32.5 g/dL (ref 30.0–36.0)
MCV: 90 fL (ref 80.0–100.0)
Platelets: 262 10*3/uL (ref 150–400)
RBC: 3.69 MIL/uL — ABNORMAL LOW (ref 3.87–5.11)
RDW: 14 % (ref 11.5–15.5)
WBC: 10.2 10*3/uL (ref 4.0–10.5)
nRBC: 0 % (ref 0.0–0.2)

## 2022-05-16 LAB — GLUCOSE, CAPILLARY
Glucose-Capillary: 321 mg/dL — ABNORMAL HIGH (ref 70–99)
Glucose-Capillary: 403 mg/dL — ABNORMAL HIGH (ref 70–99)
Glucose-Capillary: 397 mg/dL — ABNORMAL HIGH (ref 70–99)
Glucose-Capillary: 424 mg/dL — ABNORMAL HIGH (ref 70–99)

## 2022-05-16 LAB — BASIC METABOLIC PANEL
Anion gap: 9 (ref 5–15)
BUN: 43 mg/dL — ABNORMAL HIGH (ref 6–20)
CO2: 26 mmol/L (ref 22–32)
Calcium: 8 mg/dL — ABNORMAL LOW (ref 8.9–10.3)
Chloride: 93 mmol/L — ABNORMAL LOW (ref 98–111)
Creatinine, Ser: 1.87 mg/dL — ABNORMAL HIGH (ref 0.44–1.00)
GFR, Estimated: 34 mL/min — ABNORMAL LOW (ref >60)
Glucose, Bld: 368 mg/dL — ABNORMAL HIGH (ref 70–99)
Potassium: 3.5 mmol/L (ref 3.5–5.1)
Sodium: 128 mmol/L — ABNORMAL LOW (ref 135–145)

## 2022-05-16 LAB — HEMOGLOBIN A1C
Hgb A1c MFr Bld: 14.9 % — ABNORMAL HIGH (ref 4.8–5.6)
Mean Plasma Glucose: 380.93 mg/dL

## 2022-05-16 MED ORDER — INSULIN GLARGINE-YFGN 100 UNIT/ML ~~LOC~~ SOLN
40.0000 [IU] | Freq: Two times a day (BID) | SUBCUTANEOUS | Status: DC
Start: 2022-05-16 — End: 2022-05-19
  Administered 2022-05-16 – 2022-05-19 (×6): 40 [IU] via SUBCUTANEOUS
  Filled 2022-05-16 (×7): qty 0.4

## 2022-05-16 MED ORDER — LORAZEPAM 2 MG/ML IJ SOLN: 1.0000 mg | Freq: Once | INTRAMUSCULAR | Status: DC | PRN

## 2022-05-16 MED ORDER — LORAZEPAM 1 MG PO TABS: 1.0000 mg | ORAL_TABLET | Freq: Once | ORAL | Status: DC | PRN

## 2022-05-16 MED ORDER — LORAZEPAM 2 MG/ML IJ SOLN: 2.0000 mg | Freq: Once | INTRAMUSCULAR | Status: AC

## 2022-05-16 MED ORDER — LORAZEPAM 1 MG PO TABS
2.0000 mg | ORAL_TABLET | Freq: Once | ORAL | Status: AC
  Administered 2022-05-17: 2 mg via ORAL
  Filled 2022-05-16: qty 2

## 2022-05-16 MED ORDER — VANCOMYCIN HCL IN DEXTROSE 1-5 GM/200ML-% IV SOLN
1000.0000 mg | INTRAVENOUS | Status: DC
  Administered 2022-05-16: 1000 mg via INTRAVENOUS
  Filled 2022-05-16 (×2): qty 200

## 2022-05-16 NOTE — Progress Notes (Signed)
PHARMACY ANTIBIOTIC CONSULT NOTE   [name] a 46 y.o. female admitted on 05/16/2022 with hyperglycemia . Patient has a PMH significant for uncontrolled insulin-dependent diabetes, left BKA, right partial foot amputation, and osteomyelitis. Patient has a chronic right foot wound concerning for osteomyelitis on x-ray.  Pharmacy has been consulted for Vancomycin/cefepime dosing.  05/16/2022: Scr up to 1.87 (BL ~1.1-1.3), last LA down to 2.4, WBC down to 10.2, afebrile, vital signs stable  Estimated Creatinine Clearance: 55.6 mL/min (A) (by C-G formula based on SCr of 1.87 mg/dL (H)).  Plan: Decrease vancomycin dose to 1000 mg IV q24h       (Scr used: 1.87, Vd used: 0.5, eAUC: 438.2) Continue cefepime 2g IV q12h  Continue metronidazole 500 mg IV BID per MD  Monitor renal function, clinical status, de-escalation, C/S, levels as indicated  F/u MRI for further osteomyelitis workup  Allergies:  Allergies  Allergen Reactions   Cefepime Other (See Comments)    AKI, see records from [facility] in January 2020.   Other reaction(s): Other (See Comments) "Shut down organs/kidneys"  Note pt has tolerated Rocephin and Keflex   Iodine Other (See Comments)    Kidney dysfunction   Kiwi Extract Shortness Of Breath, Swelling and Anaphylaxis   Morphine And Related Nausea And Vomiting   Nalbuphine Other (See Comments)    Hallucinations     Trental [Pentoxifylline] Nausea And Vomiting   Toradol [Ketorolac Tromethamine] Other (See Comments)    Feels like something is crawling on me   Morphine Nausea And Vomiting   Nubain [Nalbuphine Hcl] Other (See Comments)    "FEELS LIKE SOMETHING CRAWLING ON ME"    Filed Weights   05/14/22 1058  Weight: (!) 154.2 kg (340 lb)       Latest Ref Rng & Units 05/16/2022    1:43 AM 05/15/2022    3:51 AM 05/14/2022   12:00 PM  CBC  WBC 4.0 - 10.5 K/uL 10.2  15.5    Hemoglobin 12.0 - 15.0 g/dL 10.8  12.3  14.3   Hematocrit 36.0 - 46.0 % 33.2  37.6   42.0   Platelets 150 - 400 K/uL 262  277      Antimicrobials this admission: Vancomycin 6/10>> (first dose delayed for unknown reason) Cefepime 6/9>> Flagyl 6/9>>  Microbiology results: 6/9 Bcx: no growth to date  Thank you for allowing pharmacy to be a part of this patient's care.  Donald Pore, PharmD Pharmacy Resident 05/16/2022, 10:56 AM

## 2022-05-16 NOTE — Progress Notes (Signed)
Placed patient on CPAP for the night via auto-mode with oxygen set at 2lpm  

## 2022-05-16 NOTE — Progress Notes (Signed)
Daily Progress Note Intern Pager: [phone]  Patient name: [name] Medical record number: [id number] Date of birth: [birth year] Age: 46 y.o. Gender: female  Primary Care Provider: [name], MD Consultants: N/A Code Status: Full  Pt Overview and Major Events to Date:  6/9: Admitted and Endo tool initiated for HHS 6/10: Endo tool discontinued and switched to SQ insulin  Assessment and Plan:  [name] is a 46 year old female admitted for HHS in the setting of poorly controlled type 2 diabetes. Pertinent PMH/PSH includes T2DM-insulin-dependent c/b diabetic neuropathy, COPD, s/p AKA of LLE, CKD stage III, bipolar 2 disorder, and OSA on BiPAP.   * Type 2 diabetes mellitus with hyperosmolar hyperglycemic state (HHS) ([place]) Patient's CBG this morning has ranged from 174-396.  Anion gap closed x2 so transitioned from Endotool to SQ insulin with a 2hr overlap. She is currently feeding and tolerating well. On BiPAP during assessment, and breathing well with it. -Increase to home 40u Semglee BID -Continue maintenance IV fluid -Moderate sliding scale insulin -Placed on carb modified diet -Continue CBG monitoring  Amputation stump infection, right foot ([name]) Patient with non healing right diabetic foot ulcer. On admission have leukocytosis and Foot Xray showing possible osteomylitis. Will get her R foot MRI today. Concerns about oxygenation, pain, and anxiety prevented obtaining yesterday; RN checked O2 sats while patient lied flat, and maintained. Patient prescribed Ativan 2 mg for sedation while getting MRI. Blood culture showed no growth in less than 24 hours. Patient continues to complain of RLE pain today up to the knee, R thigh was warm to touch, but R leg of normal temperature. RLE Doppler with no evidence of DVT. -Follow-up MRI -Follow up ABI -Vitals per floor routine - Continue vancomycin, cefipime, Flagyl  -PO Percocet 5-325 Q6H PRN -PO Tylenol 650 Q6H  PRN    AKI (acute kidney injury) ([name]) Creatinine on admission is 1.57 and today is 1.87. Baseline is 1.3-1.4.  -Continue fluid resuscitation with LR at 100 mL/h -Continue to monitor kidney function with daily BMP labs. -Avoid nephrotoxic agents  Diastolic HF (heart failure) ([name]) No sings of volume overlaod on physical exam again today; her cardiac and pulmonary exams were normal, and patient without lower extremity pitting edema.  -Will continue to hold home torsemide -Monitor daily weights -Monitor intake and output.  Obstructive sleep apnea (adult) (pediatric) On home BiPAP nightly. -continue nightly BiPAP       FEN/GI: Carb modified PPx: Lovenox Dispo:Home pending clinical improvement . Barriers include clinical improvement.   Subjective:  [name] is lying in bed with BiPAP.  She says that she is still waking up, but at the moment has no acute concerns or complaints.  She is willing to trial monitoring O2 sats while lying flat in preparation for MRI.  She reports that anxiety was a huge component as to why she was unable to receive a line yesterday, but is willing to proceed as long as she is given medication for anxiety.  Objective: Temp:  [96.8 F (36 C)-97.8 F (36.6 C)] 96.8 F (36 C) (06/11 0809) Pulse Rate:  [77-97] 90 (06/11 0809) Resp:  [20] 20 (06/11 0809) BP: (95-161)/(53-84) 161/84 (06/11 0809) SpO2:  [97 %] 97 % (06/10 1203) Physical Exam: General: Morbidly obese female patient, lying in bed with BiPAP in place, NAD Cardiovascular: RRR, no abnormal sounds auscultated, but difficult to auscultate 2/2 body habitus Respiratory: CTA x2, but difficult to auscultate due to body habitus. Abdomen: Obese, distended, no tenderness,  bowel sounds difficult to auscultate Extremities: RLE tender to light touch with erythema only in thigh; dressing c/d/i.  Left AKA without tenderness to palpation  Laboratory: Most recent CBC Lab Results  Component Value Date    WBC 10.2 05/16/2022   HGB 10.8 (L) 05/16/2022   HCT 33.2 (L) 05/16/2022   MCV 90.0 05/16/2022   PLT 262 05/16/2022   Most recent BMP    Latest Ref Rng & Units 05/16/2022    1:43 AM  BMP  Glucose 70 - 99 mg/dL 368   BUN 6 - 20 mg/dL 43   Creatinine 0.44 - 1.00 mg/dL 1.87   Sodium 135 - 145 mmol/L 128   Potassium 3.5 - 5.1 mmol/L 3.5   Chloride 98 - 111 mmol/L 93   CO2 22 - 32 mmol/L 26   Calcium 8.9 - 10.3 mg/dL 8.0     Other pertinent labs A1c 14.9  Imaging/Diagnostic Tests: No new images to review; MRI right foot pending  Belinda Schlatter, MD 05/16/2022, 11:51 AM  PGY-1, [place] Intern pager: [phone], text pages welcome Secure chat group [place]

## 2022-05-16 NOTE — Inpatient Diabetes Management (Signed)
Inpatient Diabetes Program Recommendations  AACE/ADA: New Consensus Statement on Inpatient Glycemic Control (2015)  Target Ranges:  Prepandial:   less than 140 mg/dL      Peak postprandial:   less than 180 mg/dL (1-2 hours)      Critically ill patients:  140 - 180 mg/dL   Lab Results  Component Value Date   GLUCAP 321 (H) 05/16/2022   HGBA1C 14.9 (H) 05/16/2022    Review of Glycemic Control  Di  Latest Reference Range & Units 05/15/22 07:22 05/15/22 11:51 05/15/22 16:20 05/15/22 21:14 05/16/22 08:09  Glucose-Capillary 70 - 99 mg/dL 212 (H) 346 (H) 353 (H) 396 (H) 321 (H)  (H): Data is abnormally high    Diabetes history: DM2   Outpatient Diabetes medications: Tresiba 30 units TID, Novolog 30 units TID, Trulicity 3 mg weekly + Omnipod insulin pump for additional boluses, Dexcom CGM  Current orders for Inpatient glycemic control: Semglee 25 units BID, Novolog 0-20 units TID  Inpatient Diabetes Program Recommendations:    -Semglee 40 units  BID -Novolog 12 units TID with meals if consumes at least 50% -Add Novolog 0-20 QHS  Will continue to follow while inpatient.  Thank you, Belinda Dixon, MSN, [place] Coordinator Inpatient Diabetes Program [phone] (team pager from 8a-5p)

## 2022-05-17 ENCOUNTER — Other Ambulatory Visit (HOSPITAL_COMMUNITY): Payer: Self-pay

## 2022-05-17 ENCOUNTER — Telehealth: Payer: Self-pay

## 2022-05-17 ENCOUNTER — Inpatient Hospital Stay (HOSPITAL_COMMUNITY): Payer: Medicaid Other

## 2022-05-17 ENCOUNTER — Inpatient Hospital Stay: Payer: Self-pay

## 2022-05-17 DIAGNOSIS — M86171 Other acute osteomyelitis, right ankle and foot: Secondary | ICD-10-CM | POA: Diagnosis not present

## 2022-05-17 DIAGNOSIS — T8740 Infection of amputation stump, unspecified extremity: Secondary | ICD-10-CM | POA: Diagnosis not present

## 2022-05-17 DIAGNOSIS — N179 Acute kidney failure, unspecified: Secondary | ICD-10-CM | POA: Diagnosis not present

## 2022-05-17 DIAGNOSIS — E1122 Type 2 diabetes mellitus with diabetic chronic kidney disease: Secondary | ICD-10-CM

## 2022-05-17 DIAGNOSIS — E1100 Type 2 diabetes mellitus with hyperosmolarity without nonketotic hyperglycemic-hyperosmolar coma (NKHHC): Secondary | ICD-10-CM | POA: Diagnosis not present

## 2022-05-17 DIAGNOSIS — I130 Hypertensive heart and chronic kidney disease with heart failure and stage 1 through stage 4 chronic kidney disease, or unspecified chronic kidney disease: Secondary | ICD-10-CM

## 2022-05-17 DIAGNOSIS — I5032 Chronic diastolic (congestive) heart failure: Secondary | ICD-10-CM

## 2022-05-17 LAB — BASIC METABOLIC PANEL
Anion gap: 8 (ref 5–15)
BUN: 51 mg/dL — ABNORMAL HIGH (ref 6–20)
CO2: 25 mmol/L (ref 22–32)
Calcium: 8.4 mg/dL — ABNORMAL LOW (ref 8.9–10.3)
Chloride: 96 mmol/L — ABNORMAL LOW (ref 98–111)
Creatinine, Ser: 2.48 mg/dL — ABNORMAL HIGH (ref 0.44–1.00)
GFR, Estimated: 24 mL/min — ABNORMAL LOW (ref >60)
Glucose, Bld: 318 mg/dL — ABNORMAL HIGH (ref 70–99)
Potassium: 4.2 mmol/L (ref 3.5–5.1)
Sodium: 129 mmol/L — ABNORMAL LOW (ref 135–145)

## 2022-05-17 LAB — CBC
HCT: 31.5 % — ABNORMAL LOW (ref 36.0–46.0)
Hemoglobin: 10.1 g/dL — ABNORMAL LOW (ref 12.0–15.0)
MCH: 29.3 pg (ref 26.0–34.0)
MCHC: 32.1 g/dL (ref 30.0–36.0)
MCV: 91.3 fL (ref 80.0–100.0)
Platelets: 274 10*3/uL (ref 150–400)
RBC: 3.45 MIL/uL — ABNORMAL LOW (ref 3.87–5.11)
RDW: 13.9 % (ref 11.5–15.5)
WBC: 8.3 10*3/uL (ref 4.0–10.5)
nRBC: 0 % (ref 0.0–0.2)

## 2022-05-17 LAB — CREATININE, URINE, RANDOM: Creatinine, Urine: 82.33 mg/dL

## 2022-05-17 LAB — GLUCOSE, CAPILLARY
Glucose-Capillary: 335 mg/dL — ABNORMAL HIGH (ref 70–99)
Glucose-Capillary: 354 mg/dL — ABNORMAL HIGH (ref 70–99)

## 2022-05-17 LAB — URINALYSIS, COMPLETE (UACMP) WITH MICROSCOPIC
Bilirubin Urine: NEGATIVE
Glucose, UA: >=500 mg/dL — AB
Ketones, ur: NEGATIVE mg/dL
Nitrite: NEGATIVE
Protein, ur: 100 mg/dL — AB
RBC / HPF: >50 RBC/hpf — ABNORMAL HIGH (ref 0–5)
Specific Gravity, Urine: 1.012 (ref 1.005–1.030)
pH: 5 (ref 5.0–8.0)

## 2022-05-17 LAB — SODIUM, URINE, RANDOM: Sodium, Ur: 22 mmol/L

## 2022-05-17 MED ORDER — SODIUM CHLORIDE 0.9% FLUSH
10.0000 mL | Freq: Two times a day (BID) | INTRAVENOUS | Status: DC
  Administered 2022-05-17 – 2022-05-20 (×5): 10 mL

## 2022-05-17 MED ORDER — GADOBUTROL 1 MMOL/ML IV SOLN
5.0000 mL | Freq: Once | INTRAVENOUS | Status: AC | PRN
  Administered 2022-05-17: 5 mL via INTRAVENOUS

## 2022-05-17 MED ORDER — SODIUM CHLORIDE 0.9% FLUSH
10.0000 mL | INTRAVENOUS | Status: DC | PRN
  Administered 2022-05-19 – 2022-05-22 (×2): 10 mL

## 2022-05-17 MED ORDER — INSULIN ASPART 100 UNIT/ML IJ SOLN
12.0000 [IU] | Freq: Three times a day (TID) | INTRAMUSCULAR | Status: DC
Start: 2022-05-17 — End: 2022-05-18
  Administered 2022-05-17 – 2022-05-18 (×3): 12 [IU] via SUBCUTANEOUS

## 2022-05-17 MED ORDER — METRONIDAZOLE 500 MG PO TABS
500.0000 mg | ORAL_TABLET | Freq: Two times a day (BID) | ORAL | Status: DC
  Administered 2022-05-17 – 2022-05-22 (×10): 500 mg via ORAL
  Filled 2022-05-17 (×10): qty 1

## 2022-05-17 MED ORDER — GABAPENTIN 600 MG PO TABS
900.0000 mg | ORAL_TABLET | Freq: Three times a day (TID) | ORAL | Status: DC
  Administered 2022-05-17 – 2022-05-25 (×18): 900 mg via ORAL
  Filled 2022-05-17 (×21): qty 2

## 2022-05-17 MED ORDER — OXYCODONE HCL 5 MG PO TABS
15.0000 mg | ORAL_TABLET | ORAL | Status: AC
  Administered 2022-05-17: 15 mg via ORAL
  Filled 2022-05-17: qty 3

## 2022-05-17 MED ORDER — SODIUM CHLORIDE 0.9 % IV SOLN
150.0000 mg | INTRAVENOUS | Status: DC
  Administered 2022-05-17 – 2022-05-23 (×7): 150 mg via INTRAVENOUS
  Filled 2022-05-17 (×6): qty 7.5
  Filled 2022-05-17: qty 5
  Filled 2022-05-17: qty 15
  Filled 2022-05-17: qty 5

## 2022-05-17 MED ORDER — CHLORHEXIDINE GLUCONATE CLOTH 2 % EX PADS: 6.0000 | MEDICATED_PAD | Freq: Every day | CUTANEOUS | Status: DC

## 2022-05-17 MED ORDER — INSULIN ASPART 100 UNIT/ML IJ SOLN
0.0000 [IU] | Freq: Every day | INTRAMUSCULAR | Status: DC
  Administered 2022-05-17: 5 [IU] via SUBCUTANEOUS
  Administered 2022-05-18 – 2022-05-19 (×2): 3 [IU] via SUBCUTANEOUS
  Administered 2022-05-20: 2 [IU] via SUBCUTANEOUS

## 2022-05-17 MED ORDER — VANCOMYCIN HCL 750 MG/150ML IV SOLN
750.0000 mg | INTRAVENOUS | Status: DC
Start: 2022-05-18 — End: 2022-05-18
  Administered 2022-05-18: 750 mg via INTRAVENOUS
  Filled 2022-05-17: qty 150

## 2022-05-17 MED ORDER — INSULIN ASPART 100 UNIT/ML IJ SOLN
0.0000 [IU] | Freq: Three times a day (TID) | INTRAMUSCULAR | Status: DC
  Administered 2022-05-18: 20 [IU] via SUBCUTANEOUS
  Administered 2022-05-18: 11 [IU] via SUBCUTANEOUS
  Administered 2022-05-18 – 2022-05-19 (×2): 15 [IU] via SUBCUTANEOUS
  Administered 2022-05-19: 11 [IU] via SUBCUTANEOUS
  Administered 2022-05-19: 15 [IU] via SUBCUTANEOUS
  Administered 2022-05-20: 4 [IU] via SUBCUTANEOUS
  Administered 2022-05-20 (×2): 7 [IU] via SUBCUTANEOUS
  Administered 2022-05-21 – 2022-05-23 (×5): 4 [IU] via SUBCUTANEOUS
  Administered 2022-05-23: 7 [IU] via SUBCUTANEOUS
  Administered 2022-05-24: 4 [IU] via SUBCUTANEOUS
  Administered 2022-05-24: 7 [IU] via SUBCUTANEOUS
  Administered 2022-05-24: 3 [IU] via SUBCUTANEOUS
  Administered 2022-05-25: 4 [IU] via SUBCUTANEOUS
  Filled 2022-05-17: qty 1

## 2022-05-17 NOTE — Evaluation (Addendum)
Occupational Therapy Evaluation Patient Details Name: [name] MRN: [id number] DOB: 1975-12-28 Today's Date: 05/17/2022   History of Present Illness Pt is a 46 y.o. female who presented  05/14/22 with hypergylcemic event with CBG over 600 for the last 3 days, also endorses chest pain. +cellulitis rt foot with nonhealing diabetic foot ulcer. MRI showing Increased soft tissue ulceration medially in the forefoot abutting the 1st metatarsal remnant; suspicious for osteomyelitis. PMH: type 2 diabetes, morbid obesity, obesity hypoventilation syndrome, OSA wears BiPAP at night, hypertension, asthma, GERD, hyperlipidemia, restless leg syndrome, left BKA, R transmetatarsal amputation, mood disorder, hypertension, history of chest pain   Clinical Impression   Pt dependent at baseline for ADLs/functional mobility; lives with children, friend, and has PCA that comes 5 days/week x 8 hrs/day, assists with all ADLs. Reports using RW and walking short household distances. Pt currently min-max A for ADLs, min guard for bed mobility, transfers deferred as awaiting RLE WB precautions (reached out to MD) and pt does not have L LE prosthetic in room. Pt presenting with impairments listed below, will follow acutely. Recommend HHOT at d/c.     Recommendations for follow up therapy are one component of a multi-disciplinary discharge planning process, led by the attending physician.  Recommendations may be updated based on patient status, additional functional criteria and insurance authorization.   Follow Up Recommendations  Home health OT    Assistance Recommended at Discharge Frequent or constant Supervision/Assistance  Patient can return home with the following A lot of help with walking and/or transfers;A lot of help with bathing/dressing/bathroom;Assistance with cooking/housework;Direct supervision/assist for medications management;Direct supervision/assist for financial management;Assist for transportation;Help  with stairs or ramp for entrance    Functional Status Assessment  Patient has had a recent decline in their functional status and demonstrates the ability to make significant improvements in function in a reasonable and predictable amount of time.  Equipment Recommendations  None recommended by OT;Other (comment) (pt has all needed DME)    Recommendations for Other Services PT consult     Precautions / Restrictions Precautions Precautions: Fall Restrictions Weight Bearing Restrictions:  (no formal order, assuming NWB RLE due to nonhealing wound) Other Position/Activity Restrictions: does not have LLE prosthetic in room      Mobility Bed Mobility Overal bed mobility: Needs Assistance Bed Mobility: Sit to Supine, Supine to Sit     Supine to sit: Min guard Sit to supine: Min guard   General bed mobility comments: rolls independently, min guard for stability once EOB    Transfers                   General transfer comment: deferred      Balance Overall balance assessment: Needs assistance Sitting-balance support: Bilateral upper extremity supported Sitting balance-Leahy Scale: Good Sitting balance - Comments: can reach outside BOS without LOB                                   ADL either performed or assessed with clinical judgement   ADL Overall ADL's : Needs assistance/impaired Eating/Feeding: Set up;Sitting   Grooming: Minimal assistance;Sitting   Upper Body Bathing: Maximal assistance;Sitting;Bed level   Lower Body Bathing: Maximal assistance;Bed level;Sitting/lateral leans   Upper Body Dressing : Maximal assistance;Sitting Upper Body Dressing Details (indicate cue type and reason): to don gown Lower Body Dressing: Maximal assistance;Bed level   Toilet Transfer: Maximal assistance;+2 for physical assistance Toilet Transfer Details (  indicate cue type and reason): via lateral scoot Toileting- Clothing Manipulation and Hygiene: Total  assistance;Bed level Toileting - Clothing Manipulation Details (indicate cue type and reason): for pericare     Functional mobility during ADLs: Maximal assistance;+2 for physical assistance       Vision   Vision Assessment?: No apparent visual deficits     Perception     Praxis      Pertinent Vitals/Pain Pain Assessment Pain Assessment: Faces Pain Score: 7  Faces Pain Scale: Hurts even more Pain Location: RLE Pain Descriptors / Indicators: Discomfort, Constant Pain Intervention(s): Limited activity within patient's tolerance, Monitored during session, Repositioned     Hand Dominance Right   Extremity/Trunk Assessment Upper Extremity Assessment Upper Extremity Assessment: Overall WFL for tasks assessed   Lower Extremity Assessment Lower Extremity Assessment: LLE deficits/detail;RLE deficits/detail RLE Deficits / Details: R transmetatarsal amputation LLE Deficits / Details: L AKA   Cervical / Trunk Assessment Cervical / Trunk Assessment: Other exceptions Cervical / Trunk Exceptions: body habitus   Communication Communication Communication: No difficulties   Cognition Arousal/Alertness: Awake/alert Behavior During Therapy: WFL for tasks assessed/performed, Flat affect Overall Cognitive Status: No family/caregiver present to determine baseline cognitive functioning                                 General Comments: lethargic likely from meds, however awakens more when at EOB     General Comments  VSS on 3L O2    Exercises     Shoulder Instructions      Home Living Family/patient expects to be discharged to:: Private residence Living Arrangements: Children;Non-relatives/Friends Available Help at Discharge: Family;Personal care attendant;Available 24 hours/day;Friend(s) Type of Home: Apartment Home Access: Level entry     Home Layout: One level     Bathroom Shower/Tub: Tub/shower unit;Sponge bathes at baseline   Bathroom Toilet:  Standard Bathroom Accessibility: Yes How Accessible: Accessible via walker Home Equipment: Rolling Walker (2 wheels);BSC/3in1;Grab bars - tub/shower;Tub bench;Wheelchair - manual   Additional Comments: has LLE prosthesis, not in room      Prior Functioning/Environment Prior Level of Function : Needs assist             Mobility Comments: pt reports she uses RW to walk short distances; ~20 ft ADLs Comments: daughter and aide (5 days x8 hrs/day) assist with ADLs/IADLs, uses transportation services for getting to dr appts        OT Problem List: Decreased strength;Decreased range of motion;Impaired balance (sitting and/or standing);Decreased activity tolerance;Decreased safety awareness;Obesity      OT Treatment/Interventions: Self-care/ADL training;Therapeutic exercise;Energy conservation;DME and/or AE instruction;Therapeutic activities;Patient/family education;Balance training    OT Goals(Current goals can be found in the care plan section) Acute Rehab OT Goals Patient Stated Goal: none stated OT Goal Formulation: With patient Time For Goal Achievement: 05/31/22 Potential to Achieve Goals: Fair ADL Goals Pt Will Perform Grooming: Independently;bed level Pt Will Perform Upper Body Bathing: with supervision;sitting;bed level Pt Will Transfer to Toilet: bedside commode;with supervision Additional ADL Goal #1: pt will complete bed mobility with mod I in prep for ADLs  OT Frequency: Min 3X/week    Co-evaluation              AM-PAC OT "6 Clicks" Daily Activity     Outcome Measure Help from another person eating meals?: None Help from another person taking care of personal grooming?: A Little Help from another person toileting, which includes using toliet, bedpan,  or urinal?: A Lot Help from another person bathing (including washing, rinsing, drying)?: A Lot Help from another person to put on and taking off regular upper body clothing?: A Lot Help from another person to  put on and taking off regular lower body clothing?: A Lot 6 Click Score: 15   End of Session Nurse Communication: Mobility status;Other (comment) (Pt's IV came out; needs purewick)  Activity Tolerance: Patient tolerated treatment well Patient left: in bed;with call bell/phone within reach;with bed alarm set  OT Visit Diagnosis: Unsteadiness on feet (R26.81);Other abnormalities of gait and mobility (R26.89);Muscle weakness (generalized) (M62.81)                Time: 8469-6295 OT Time Calculation (min): 32 min Charges:  OT General Charges $OT Visit: 1 Visit OT Evaluation $OT Eval Moderate Complexity: 1 Mod OT Treatments $Self Care/Home Management : 8-22 mins  Lynnda Child, OTD, OTR/L Acute Rehab [phone]) [phone] - [place] 05/17/2022, 12:42 PM

## 2022-05-17 NOTE — Consult Note (Addendum)
[facility]    Date of Admission:  05/14/2022   Total days of inpatient antibiotics 3        Reason for Consult: Right foot osteomyelitis    Principal Problem:   Type 2 diabetes mellitus with hyperosmolar hyperglycemic state (HHS) ([place]) Active Problems:   Insulin dependent type 2 diabetes mellitus ([name])   Obstructive sleep apnea (adult) (pediatric)   Hypertension   AKI (acute kidney injury) ([place])   S/P transmetatarsal amputation of foot, right ([name])   Chronic pain   Impaired mobility   Chronic kidney disease, stage 3a ([name])   Diastolic HF (heart failure) ([name])   Amputation stump infection, right foot ([name])   History of Candida glabrata infection, right foot stump infection   Peripheral artery disease, right leg, mild([name])   Assessment: 46 year old female admitted with HHS found to have right foot cellulitis and nonhealing diabetic foot wound.  WBC 15.5 K, patient afebrile, she was started on broad-spectrum antibiotics.  ID engaged.  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  New bone marrow enhancement in the remaining first metatarsal suspicious for recurrent osteomyelitis.   -ID hx: Patient is followed by Dr. Lucilla Edin at [facility] in [place],  [place].  She has had multiple I&D's of her right foot.  Initially underwent TMA on 12/25/2018 with cultures showing mixed GP organisms, rare  anaerobic GNR's and 1+ staph epi treated with moxifloxacin for about 2 months.  She was admitted at [facility] on 12/18/2021 for HHS and nonhealing TMA ulcer and was discharged on Augmentin x4 weeks with MRI showing early osteo.  On 12/27/2021 MRI showed  osteomyelitis of first metatarsal and fifth metatarsal taken to OR with podiatry noted to have ulcer that tracked to the bone, OR cultures grew Candida glabrata patient was discharged on Levaquin, vancomycin, micafungin until 02/09/2022.    Plan  was to do about 6 to 12  months of antifungals.She was trialed on ibrexafungerp but could not tolerate due to nausea. At ID visit on 5/16, she has been off antifungal for about a month. Wanted to avoid PICC line for 6 months and no clear oral options. There was concern about appearance of the wound and the inflammatory marker trend.  Repeat x-ray was ordered and close follow-up.  Recommendations:  -Continue cefepime, metronidazole and vancomycin -Start Micafungin -Please engage podiatry  for biospy with Cx and pathology. She continues to have purulent  drainage from wound, following about 6 weeks of IV antibiotics. I spoke to pt today and she is amenable for further surgical  intervention including amputation if needed.  -Discussed the need for glycemic control for wound healing.  Microbiology:   Antibiotics: Cefepime 6/09-p Metronidazole 6/09-p Vancomycin 6/10-p  Cultures: Blood 6/09 NG    HPI: [name] is a 46 y.o. female with type 2 diabetes, COPD, AKA a on the left lower extremity, right foot wound, CKD stage III, depression, BPD, OSA on BiPAP admitted for HHS.  Found to have cellulitis of the right foot.  MRI showed increased soft tissue ulceration medially of the right foot between the first metatarsal remnant.  New bone marrow enhancement in the remaining first metatarsal suspicious  for recurrent osteomyelitis.  She was to admit on broad-spectrum antibiotics, ID engaged.  Of note she is followed by ID at [facility] with Dr. Susa Griffins, completed more than 6 weeks of micafungin, Levaquin, vancomycin for right foot osteomyelitis.   Review of Systems: Review of Systems  All other systems reviewed and are negative.   Past Medical History:   Diagnosis Date   Abscess of groin, left    Acute on chronic diastolic (congestive) heart failure ([place]) 02/05/2022   Acute on chronic respiratory failure with hypoxia ([name]) 04/11/2022   Acute osteomyelitis of ankle or foot, left ([place]) 01/31/2018   Acute respiratory failure with hypoxia and hypercarbia ([place]) 06/15/2020   Alveolar hypoventilation    Amputation stump infection ([place]) 04/09/2018   Anemia    not on iron pill   Asthma    Bipolar 2 disorder ([name])    Candidiasis of vagina 05/15/2022   Carpal tunnel syndrome on right    recurrent   Cellulitis 08/2010-08/2011   Chest pain with low risk for cardiac etiology 05/11/2021   Chronic pain    COPD (chronic obstructive pulmonary disease) ([name])    Symbicort daily and Proventil as needed   Costochondritis    De Quervain's tenosynovitis, bilateral 11/01/2015   Depression    Diabetes mellitus type II, uncontrolled 2000   Type 2, Uncontrolled.Takes Lantus daily.Fasting blood sugar runs 150   Diabetic ulcer of right foot ([place]) 12/19/2021   Drug-seeking behavior    Elevated troponin    Exposure to mold 04/21/2020   GERD (gastroesophageal reflux disease)    takes Pantoprazole and Zantac daily   HLD (hyperlipidemia)    takes Atorvastatin daily   Hypertension    takes Lisinopril and Coreg daily   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[place]) 04/11/2019   Left shoulder pain 06/23/2019   Morbid obesity ([place])    Morbid obesity with BMI of 60.0-69.9, adult ([place]) 04/15/2021   Nocturia    Nonobstructive atherosclerosis of coronary artery 11/26/2020   11/26/20 Coronary angiogram: 1.  Mild diffuse proximal LAD stenosis with no evidence of obstructive disease (20 to 30% diffuse stenosis);  2.  Left dominant circumflex with widely patent obtuse marginal branches and left PDA branch, no significant stenoses present  3.  Nondominant RCA with mild diffuse nonobstructive stenosis    OSA on CPAP    Peripheral neuropathy    takes Gabapentin daily   Pneumonia    "walking" several  yrs ago and as a baby (12/05/2018)   Pneumonia due to COVID-19 virus 04/14/2021   Primary osteoarthritis of first carpometacarpal joint of left hand 07/30/2016   Rectal fissure    Restless leg    Right carpal tunnel syndrome 09/01/2011   SVT (supraventricular tachycardia) ([place])    Syncope 02/25/2016   Thrombocytosis 04/11/2022   Urinary frequency    Urinary incontinence 10/23/2020   UTI (urinary tract infection) 04/15/2021   Varicose veins    Right medial thigh and Left leg     Social History   Tobacco Use   Smoking status: Former    Packs/day: 0.25    Years: 15.00    Total pack years: 3.75    Types: Cigarettes    Quit date: 12/06/2005    Years since quitting: 16.4   Smokeless tobacco: Never   Tobacco comments:    smokes for a couple of months  Vaping Use   Vaping Use: Never used  Substance Use Topics   Alcohol use: No    Alcohol/week: 0.0 standard drinks of  alcohol   Drug use: Not Currently    Comment: OD attempts on home meds      Family History  Problem Relation Age of Onset   Diabetes Mother    Hyperlipidemia Mother    Depression Mother    GER disease Mother    Allergic rhinitis Mother    Restless legs syndrome Mother    Heart attack Paternal Uncle    Heart disease Paternal Grandmother    Heart attack Paternal Grandmother    Heart attack Paternal Grandfather    Heart disease Paternal Grandfather    Heart attack Father    Migraines Sister    Cancer Maternal Grandmother        COLON   Hypertension Maternal Grandmother    Hyperlipidemia Maternal Grandmother    Diabetes Maternal Grandmother    Other Maternal Grandfather        GUN SHOT   Anxiety disorder Sister    Asthma Child    Scheduled Meds:  aspirin EC  81 mg Oral Daily   DULoxetine  60 mg Oral Daily   enoxaparin (LOVENOX) injection  80 mg Subcutaneous Q24H   gabapentin  900 mg Oral TID   insulin aspart  12 Units Subcutaneous TID WC   insulin glargine-yfgn  40 Units Subcutaneous BID   metroNIDAZOLE   500 mg Oral Q12H   mometasone-formoterol  2 puff Inhalation BID   oxyCODONE  15 mg Oral NOW   prednisoLONE acetate  1 drop Both Eyes QID   revefenacin  175 mcg Nebulization Daily   Continuous Infusions:  ceFEPime (MAXIPIME) IV 2 g (05/17/22 0847)   lactated ringers 100 mL/hr at 05/17/22 0458   micafungin (MYCAMINE) 150 mg in sodium chloride 0.9 % 100 mL IVPB     [START ON 05/18/2022] vancomycin     PRN Meds:.acetaminophen, albuterol, dextrose, oxyCODONE-acetaminophen Allergies  Allergen Reactions   Cefepime Other (See Comments)    AKI, see records from [place] hospitalization in January 2020.   Other reaction(s): Other (See Comments) "Shut down organs/kidneys"  Note pt has tolerated Rocephin and Keflex   Iodine Other (See Comments)    Kidney dysfunction   Kiwi Extract Shortness Of Breath, Swelling and Anaphylaxis   Morphine And Related Nausea And Vomiting   Nalbuphine Other (See Comments)    Hallucinations     Trental [Pentoxifylline] Nausea And Vomiting   Toradol [Ketorolac Tromethamine] Other (See Comments)    Feels like something is crawling on me   Morphine Nausea And Vomiting   Nubain [Nalbuphine Hcl] Other (See Comments)    "FEELS LIKE SOMETHING CRAWLING ON ME"    OBJECTIVE: Blood pressure (!) 186/96, pulse 88, temperature 98 F (36.7 C), temperature source Oral, resp. rate 20, height '5\' 2"'$  (1.575 m), weight (!) 157 kg, SpO2 96 %.  Physical Exam Constitutional:      Appearance: Normal appearance.  HENT:     Head: Normocephalic and atraumatic.     Right Ear: Tympanic membrane normal.     Left Ear: Tympanic membrane normal.     Nose: Nose normal.     Mouth/Throat:     Mouth: Mucous membranes are moist.  Eyes:     Extraocular Movements: Extraocular movements intact.     Conjunctiva/sclera: Conjunctivae normal.     Pupils: Pupils are equal, round, and reactive to light.  Cardiovascular:     Rate and Rhythm: Normal rate and regular rhythm.     Heart sounds: No  murmur heard.    No friction  rub. No gallop.  Pulmonary:     Effort: Pulmonary effort is normal.     Breath sounds: Normal breath sounds.  Abdominal:     General: Abdomen is flat.     Palpations: Abdomen is soft.  Musculoskeletal:        General: Normal range of motion.  Skin:    General: Skin is warm and dry.  Neurological:     General: No focal deficit present.     Mental Status: She is alert and oriented to person, place, and time.  Psychiatric:        Mood and Affect: Mood normal.     Lab Results Lab Results  Component Value Date   WBC 8.3 05/17/2022   HGB 10.1 (L) 05/17/2022   HCT 31.5 (L) 05/17/2022   MCV 91.3 05/17/2022   PLT 274 05/17/2022    Lab Results  Component Value Date   CREATININE 2.48 (H) 05/17/2022   BUN 51 (H) 05/17/2022   NA 129 (L) 05/17/2022   K 4.2 05/17/2022   CL 96 (L) 05/17/2022   CO2 25 05/17/2022    Lab Results  Component Value Date   ALT 19 04/11/2022   AST 19 04/11/2022   ALKPHOS 164 (H) 04/11/2022   BILITOT 0.4 04/11/2022       Laurice Record, [facility] 05/17/2022, 2:44 PM

## 2022-05-17 NOTE — Progress Notes (Signed)
Attempted ABI, per RN, about to have sterile procedure to start PICC line. Will attempt again 6/13 as schedule permits.  05/17/2022 4:12 PM Kelby Aline., MHA, RVT, RDCS, RDMS

## 2022-05-17 NOTE — Inpatient Diabetes Management (Addendum)
Inpatient Diabetes Program Recommendations  AACE/ADA: New Consensus Statement on Inpatient Glycemic Control (2015)  Target Ranges:  Prepandial:   less than 140 mg/dL      Peak postprandial:   less than 180 mg/dL (1-2 hours)      Critically ill patients:  140 - 180 mg/dL   Lab Results  Component Value Date   GLUCAP 335 (H) 05/17/2022   HGBA1C 14.9 (H) 05/16/2022    Review of Glycemic Control  Latest Reference Range & Units 05/16/22 08:09 05/16/22 12:39 05/16/22 17:50 05/16/22 21:09 05/17/22 06:21  Glucose-Capillary 70 - 99 mg/dL 321 (H) 403 (H) 397 (H) 424 (H) 335 (H)   Diabetes history: DM2   Outpatient Diabetes medications: Tresiba 30 units TID, Novolog 30 units TID, Trulicity 3 mg weekly + Omnipod insulin pump for additional boluses, Dexcom CGM  Current orders for Inpatient glycemic control: Semglee 40 units BID, Novolog 0-20 units TID, Novolog 12 units tid meal coverage  Inpatient Diabetes Program Recommendations:    -  Increase Semglee 50 units  BID   Will continue to follow while inpatient.  Thanks, Tama Headings RN, MSN, BC-ADM Inpatient Diabetes Coordinator Team Pager [phone] (8a-5p)

## 2022-05-17 NOTE — Progress Notes (Signed)
FPTS Brief Progress Note  S:went to bedside to see patient. Patient sleeping, did not disturb.   O: BP 125/77 (BP Location: Right Wrist)   Pulse 84   Temp 97.8 F (36.6 C) (Oral)   Resp 20   Ht '5\' 2"'$  (1.575 m)   Wt (!) 154.2 kg   SpO2 96%   BMI 62.19 kg/m     A/P: - Plans per day team - Orders reviewed. Labs for AM ordered, which was adjusted as needed.   Holley Bouche, MD 05/17/2022, 5:05 AM PGY-1, [facility] Night Resident  Please page [phone] with questions.

## 2022-05-17 NOTE — Progress Notes (Addendum)
Pharmacy Antibiotic Note  [name] is a 46 y.o. female admitted on 05/14/2022 with  HHS .  Patient has a PMH significant for uncontrolled insulin-dependent diabetes, left BKA, right partial foot amputation, and osteomyelitis. Patient has a chronic right foot wound and MRI is suggestive of osteomyelitis. Pharmacy has been consulted for Vancomycin/cefepime dosing. Pharmacy has been consulted for vancomycin dosing.  05/17/2022: Scr up to 2.48 (BL ~ 1.3), last LA down to 2.4, WBC down to 8.3 (from 10.2 on 6/11), afebrile, vital signs stable.   Estimated Creatinine Clearance: 42.5 mL/min (A) (by C-G formula based on Scr of 2.48 mg/dL (H)).  Plan: - Change vancomycin dose to 750 mg IV every 24 hours. Goal trough 15-20 mcg/mL.   (Scr used: 2.48, Vd used: 0.5, eAUC: 408.2) - Continue cefepime 2 g IV q12h - Continue metronidazole 500 mg BID - Monitor renal function, clinical status, de-escalation, C/S, levels as indicated - Follow-up duration   Height: '5\' 2"'$  (157.5 cm) Weight: (!) 157 kg (346 lb 2 oz) IBW/kg (Calculated) : 50.1  Temp (24hrs), Avg:97.9 F (36.6 C), Min:97.8 F (36.6 C), Max:98 F (36.7 C)  Recent Labs  Lab 05/14/22 1125 05/14/22 1153 05/14/22 1233 05/14/22 1413 05/14/22 1538 05/15/22 0117 05/15/22 0351 05/15/22 1417 05/16/22 0143 05/17/22 0203  WBC 15.6*  --   --   --   --   --  15.5*  --  10.2 8.3  CREATININE 1.57*   < >  --   --    < > 1.62* 1.70* 1.80* 1.87* 2.48*  LATICACIDVEN  --   --  3.3* 5.6*  --   --   --  2.4*  --   --    < > = values in this interval not displayed.    Estimated Creatinine Clearance: 42.5 mL/min (A) (by C-G formula based on SCr of 2.48 mg/dL (H)).    Allergies  Allergen Reactions   Cefepime Other (See Comments)    AKI, see records from [place] hospitalization in January 2020.   Other reaction(s): Other (See Comments) "Shut down organs/kidneys"  Note pt has tolerated Rocephin and Keflex   Iodine Other (See Comments)    Kidney  dysfunction   Kiwi Extract Shortness Of Breath, Swelling and Anaphylaxis   Morphine And Related Nausea And Vomiting   Nalbuphine Other (See Comments)    Hallucinations     Trental [Pentoxifylline] Nausea And Vomiting   Toradol [Ketorolac Tromethamine] Other (See Comments)    Feels like something is crawling on me   Morphine Nausea And Vomiting   Nubain [Nalbuphine Hcl] Other (See Comments)    "FEELS LIKE SOMETHING CRAWLING ON ME"    Antimicrobials this admission: Vancomycin 6/10 >> (first dose delayed for unknown reason) Cefepime 6/9 >>  Flagyl 6/9 >>  Microbiology results: 6/9 BCx: NGTD x3d  Thank you for allowing pharmacy to be a part of this patient's care.  Freddie Breech 05/17/2022 12:50 PM

## 2022-05-17 NOTE — Plan of Care (Signed)
  Problem: Clinical Measurements: Goal: Will remain free from infection Outcome: Not Progressing   Problem: Clinical Measurements: Goal: Diagnostic test results will improve Outcome: Not Progressing   Problem: Clinical Measurements: Goal: Respiratory complications will improve Outcome: Not Progressing   Problem: Metabolic: Goal: Ability to maintain appropriate glucose levels will improve Outcome: Not Progressing   Problem: Skin Integrity: Goal: Risk for impaired skin integrity will decrease Outcome: Not Progressing

## 2022-05-17 NOTE — Telephone Encounter (Signed)
A Prior Authorization was initiated for this patients oxycodone/acetamenophen 7.5/325 through [employer].   Confirmation #[id number]

## 2022-05-17 NOTE — Progress Notes (Signed)
Peripherally Inserted Central Catheter Placement  The IV Nurse has discussed with the patient and/or persons authorized to consent for the patient, the purpose of this procedure and the potential benefits and risks involved with this procedure.  The benefits include less needle sticks, lab draws from the catheter, and the patient may be discharged home with the catheter. Risks include, but not limited to, infection, bleeding, blood clot (thrombus formation), and puncture of an artery; nerve damage and irregular heartbeat and possibility to perform a PICC exchange if needed/ordered by physician.  Alternatives to this procedure were also discussed.  Bard Power PICC patient education guide, fact sheet on infection prevention and patient information card has been provided to patient /or left at bedside.    PICC Placement Documentation  PICC Single Lumen 53/74/82 Right Cephalic 41 cm 1 cm (Active)  Indication for Insertion or Continuance of Line Prolonged intravenous therapies 05/17/22 1600  Exposed Catheter (cm) 1 cm 05/17/22 1600  Site Assessment Clean, Dry, Intact 05/17/22 1600  Line Status Flushed;Saline locked;Blood return noted 05/17/22 1600  Dressing Type Transparent;Securing device 05/17/22 1600  Dressing Status Antimicrobial disc in place 05/17/22 1600  Safety Lock Not Applicable 70/78/67 5449  Line Care Connections checked and tightened 05/17/22 1600  Dressing Intervention New dressing 05/17/22 1600  Dressing Change Due 05/24/22 05/17/22 1600       Darlyn Read 05/17/2022, 4:48 PM

## 2022-05-17 NOTE — Progress Notes (Signed)
Daily Progress Note Intern Pager: [phone]  Patient name: [name] Medical record number: [id number] Date of birth: [birth year] Age: 46 y.o. Gender: female  Primary Care Provider: [name], MD Consultants: None Code Status: Full  Pt Overview and Major Events to Date:  6/9: Admitted on Endo tool initiated 6/10: Endo tool discontinued and switched to SQ insulin  Assessment and Plan:  [name] is a 46 year old female admitted for HHS in the setting of poorly controlled type 2 diabetes.   Pertinent PMH/PSH includes insulin-dependent T2DM with neuropathy, COPD, LLE AKA, CKD stage III, bipolar 2 disorder, OSA on BiPAP. .  * Type 2 diabetes mellitus with hyperosmolar hyperglycemic state (HHS) ([place]) Patient's CBG in the last 24hr ranged from 318-424. Got a total of 75u short acting and 65 basal. Will add meal coverage insulin to patients regime. -Continue 40u Semglee BID -Add 12u Novolg TID with meals  -Continue maintenance IV fluid -Moderate sliding scale insulin -Placed on carb modified diet -Continue CBG monitoring  Amputation stump infection, right foot ([place]) Patient with non healing right diabetic foot ulcer. Foot xray on admission show questionable osteomyelitis. MRI show soft tissue swelling with recurrent osteomilitis. Will consult ID. -ID following, appreciate recs - Continue vancomycin, cefipime, Flagyl  -PO Percocet 5-325 Q6H PRN -PO Tylenol 650 Q6H PRN -Vitals per floor routine     AKI (acute kidney injury) ([place]) Creatinine is up from yesterday to 2.47. Patient currently on IV abx likely contributing to her worsening creatinine.  Hyponatremia this morning to 129, calculated corrected NA was 134.  -Continue fluid resuscitation with LR at 100 mL/h -Decrease Gabapentin to 900 TID -Continue to monitor kidney function with daily BMP labs. -Will obtain FeNA, UA  -Uric acid with morning Lab tomorrow -Avoid nephrotoxic agents  Diastolic HF (heart  failure) ([name]) Appears Euvolumic on exam. Weight today is 157kg about 3kg increase from admission  -Will continue to hold home torsemide -Monitor daily weights -Monitor intake and output.  Obstructive sleep apnea (adult) (pediatric) On home BiPAP nightly -continue nightly BiPAP       FEN/GI: Carb modified diet PPx: Lovenox Dispo:Home pending clinical improvement . Barriers include clinical status.   Subjective:  [name] was laying down comfortably.  She reported having her best sleep last night but still endorses RLE pain, otherwise no other complain  Objective: Temp:  [97.8 F (36.6 C)-98 F (36.7 C)] 98 F (36.7 C) (06/12 0815) Pulse Rate:  [84-88] 88 (06/12 1120) Resp:  [18-20] 20 (06/12 1120) BP: (100-186)/(49-103) 186/96 (06/12 1120) SpO2:  [95 %-96 %] 96 % (06/11 2247) Weight:  [157 kg] 157 kg (06/12 0500) Physical Exam: General: Alert, well appearing, NAD HEENT: Atraumatic, MMM, No sclera icterus CV: RRR, no murmurs, normal S1/S2 Pulm: CTAB, good WOB on RA, no crackles or wheezing Abd: Soft, no distension, no tenderness Skin: dry, warm Ext: LLE AKA, right foot dressed in white gauze, no sign of drainage.  Right knee with erythema and mild edema. Laboratory: Most recent CBC Lab Results  Component Value Date   WBC 8.3 05/17/2022   HGB 10.1 (L) 05/17/2022   HCT 31.5 (L) 05/17/2022   MCV 91.3 05/17/2022   PLT 274 05/17/2022   Most recent BMP    Latest Ref Rng & Units 05/17/2022    2:03 AM  BMP  Glucose 70 - 99 mg/dL 318   BUN 6 - 20 mg/dL 51   Creatinine 0.44 - 1.00 mg/dL 2.48   Sodium 135 -  145 mmol/L 129   Potassium 3.5 - 5.1 mmol/L 4.2   Chloride 98 - 111 mmol/L 96   CO2 22 - 32 mmol/L 25   Calcium 8.9 - 10.3 mg/dL 8.4      Imaging/Diagnostic Tests: MR FOOT RIGHT W WO CONTRAST  Result Date: 05/17/2022 CLINICAL DATA:  Osteomyelitis, foot suspected. Diabetic foot ulcer. History of right foot amputation, date of surgery unknown. EXAM: MRI OF  THE RIGHT FOREFOOT WITHOUT AND WITH CONTRAST TECHNIQUE: Multiplanar, multisequence MR imaging of the right foot was performed before and after the administration of intravenous contrast. CONTRAST:  72m GADAVIST GADOBUTROL 1 MMOL/ML IV SOLN COMPARISON:  Radiographs 12/05/2018, 12/18/2021 and 05/14/2022. MRI 12/19/2021. FINDINGS: Bones/Joint/Cartilage Status post transmetatarsal amputations through the bases of all the metatarsals, occurring between the radiographs obtained in 2019 and 12/18/2021. These postsurgical changes appear stable from the previous MRI. There is new bone marrow edema and enhancement within 1st metatarsal adjacent to the amputation, suspicious for osteomyelitis. As seen radiographically, there is an adjacent area of soft tissue ulceration and/or soft tissue emphysema. The additional metatarsal bases appear unchanged. No suspicious osseous findings within the midfoot or hindfoot. Ligaments Intact Lisfranc ligament. Muscles and Tendons Chronic fatty atrophy and denervation changes. No focal fluid collection or suspicious enhancement. Soft tissues Increased soft tissue ulceration medially in the remaining forefoot adjacent to the 1st metatarsal base. There is underlying soft tissue enhancement without focal fluid collection. The soft tissue ulceration abuts the signal changes described in the remaining 1st metatarsal above. IMPRESSION: 1. Increased soft tissue ulceration medially in the forefoot abutting the 1st metatarsal remnant. New bone marrow edema and enhancement within the remaining 1st metatarsal suspicious for recurrent osteomyelitis. 2. The additional metatarsal bases and bones of the midfoot and hindfoot appear unchanged. 3. No evidence of soft tissue abscess. Electronically Signed   By: WRichardean SaleM.D.   On: 05/17/2022 11:56    NAlen Bleacher MD 05/17/2022, 1:58 PM  PGY-1, [place] pager: [phone] text pages welcome Secure chat group [email or web address]

## 2022-05-18 ENCOUNTER — Other Ambulatory Visit (HOSPITAL_COMMUNITY): Payer: Self-pay

## 2022-05-18 ENCOUNTER — Inpatient Hospital Stay (HOSPITAL_COMMUNITY): Payer: Medicaid Other

## 2022-05-18 DIAGNOSIS — N179 Acute kidney failure, unspecified: Secondary | ICD-10-CM | POA: Diagnosis not present

## 2022-05-18 DIAGNOSIS — E1122 Type 2 diabetes mellitus with diabetic chronic kidney disease: Secondary | ICD-10-CM | POA: Diagnosis not present

## 2022-05-18 DIAGNOSIS — E1100 Type 2 diabetes mellitus with hyperosmolarity without nonketotic hyperglycemic-hyperosmolar coma (NKHHC): Secondary | ICD-10-CM | POA: Diagnosis not present

## 2022-05-18 DIAGNOSIS — M86171 Other acute osteomyelitis, right ankle and foot: Secondary | ICD-10-CM | POA: Diagnosis not present

## 2022-05-18 DIAGNOSIS — L0390 Cellulitis, unspecified: Secondary | ICD-10-CM | POA: Diagnosis not present

## 2022-05-18 LAB — URIC ACID: Uric Acid, Serum: 10.4 mg/dL — ABNORMAL HIGH (ref 2.5–7.1)

## 2022-05-18 LAB — GLUCOSE, CAPILLARY
Glucose-Capillary: 341 mg/dL — ABNORMAL HIGH (ref 70–99)
Glucose-Capillary: 395 mg/dL — ABNORMAL HIGH (ref 70–99)
Glucose-Capillary: 252 mg/dL — ABNORMAL HIGH (ref 70–99)
Glucose-Capillary: 281 mg/dL — ABNORMAL HIGH (ref 70–99)

## 2022-05-18 LAB — BASIC METABOLIC PANEL
Anion gap: 12 (ref 5–15)
BUN: 59 mg/dL — ABNORMAL HIGH (ref 6–20)
CO2: 26 mmol/L (ref 22–32)
Calcium: 8.8 mg/dL — ABNORMAL LOW (ref 8.9–10.3)
Chloride: 94 mmol/L — ABNORMAL LOW (ref 98–111)
Creatinine, Ser: 2.57 mg/dL — ABNORMAL HIGH (ref 0.44–1.00)
GFR, Estimated: 23 mL/min — ABNORMAL LOW (ref >60)
Glucose, Bld: 339 mg/dL — ABNORMAL HIGH (ref 70–99)
Potassium: 4 mmol/L (ref 3.5–5.1)
Sodium: 132 mmol/L — ABNORMAL LOW (ref 135–145)

## 2022-05-18 MED ORDER — ORAL CARE MOUTH RINSE
15.0000 mL | Freq: Two times a day (BID) | OROMUCOSAL | Status: DC
  Administered 2022-05-18 – 2022-05-25 (×8): 15 mL via OROMUCOSAL

## 2022-05-18 MED ORDER — INSULIN ASPART 100 UNIT/ML IJ SOLN
20.0000 [IU] | Freq: Three times a day (TID) | INTRAMUSCULAR | Status: DC
  Administered 2022-05-18 – 2022-05-19 (×3): 20 [IU] via SUBCUTANEOUS

## 2022-05-18 MED ORDER — SODIUM CHLORIDE 0.9 % IV SOLN
8.0000 mg/kg | Freq: Every day | INTRAVENOUS | Status: DC
  Administered 2022-05-18 – 2022-05-21 (×4): 750 mg via INTRAVENOUS
  Filled 2022-05-18 (×6): qty 15

## 2022-05-18 MED ORDER — LACTATED RINGERS IV BOLUS: 1000.0000 mL | Freq: Once | INTRAVENOUS | Status: DC

## 2022-05-18 MED ORDER — CHLORHEXIDINE GLUCONATE CLOTH 2 % EX PADS
6.0000 | MEDICATED_PAD | Freq: Every day | CUTANEOUS | Status: DC
  Administered 2022-05-18 – 2022-05-24 (×7): 6 via TOPICAL

## 2022-05-18 MED ORDER — LACTATED RINGERS IV BOLUS
1000.0000 mL | Freq: Once | INTRAVENOUS | Status: AC
Start: 2022-05-18 — End: 2022-05-18
  Administered 2022-05-18: 1000 mL via INTRAVENOUS

## 2022-05-18 NOTE — Progress Notes (Signed)
ABI completed. Refer to "CV Proc" under chart review to view preliminary results.  05/18/2022 3:03 PM Kelby Aline., MHA, RVT, RDCS, RDMS

## 2022-05-18 NOTE — Telephone Encounter (Signed)
Prior Auth for patients medication OXYCODONE/ACET approved by MEDICAID from 05/17/22 to 08/15/22.

## 2022-05-18 NOTE — Progress Notes (Signed)
FPTS Brief Progress Note  S: sleeping   O: BP (!) 150/100 (BP Location: Left Wrist)   Pulse 91   Temp 97.8 F (36.6 C) (Axillary)   Resp 20   Ht '5\' 2"'$  (1.575 m)   Wt (!) 157 kg   SpO2 97%   BMI 63.31 kg/m     A/P: T1DM uncontrolled - Added back resSSI and HS coverage as CBGs mid 300s - Orders reviewed. Labs for AM ordered, which was adjusted as needed.   AKI - Urine samples collected - FeNA 0.5%, prerenal. Pt getting IVF with LR 157m/hr.  MGladys Damme MD 05/18/2022, 4:08 AM PGY-3, [facility] Night Resident  Please page [phone] questions.

## 2022-05-18 NOTE — Progress Notes (Signed)
*  Late Note*  Pt already on CPAP at 2330. RT will cont to monitor as needed.

## 2022-05-18 NOTE — Consult Note (Signed)
[facility] Nurse Consult Note: Reason for Consult: Patient with osteomyelitis of the right foot; seen by ID Provider yesterday who recommends podiatric consultation. Discussed with MD Provider Dr.Mahoney via Secure chat and her team is awaiting results of ABI and imaging before determining if Vascular/Podiatric Medicine or Orthopedics is the appropriate consultation.  [place] nursing is asked to provide conservative topical care orders for Nursing as the patient's foot wound needs redressing. Wound type: Infectious, neuropathic Pressure Injury POA: N/A Drainage (amount, consistency, odor) Purulent according to Nursing Flow Sheet Periwound: Intact, dry, red Dressing procedure/placement/frequency: I have provided Nursing with guidance for twice daily care of the foot consisting of a soap and water cleanse, rinse and pat dry followed by placement of a saline dampened gauze topped with dry gauze and secured with Kerlix roll gauze/paper tape. This is to be performed twice daily and PRN for drainage strike through onto the exterior dressing.   Heels are to be floated for PI prevention.  [place] nursing team will not follow, but will remain available to this patient, the nursing and medical teams.  Please re-consult if needed.  Thank you for inviting [place] to participate in this patient's Plan of Care.  Maudie Flakes, MSN, RN, CNS, [place], Serita Grammes, [employer], [employer] phone:  [phone]

## 2022-05-18 NOTE — Progress Notes (Signed)
[facility] for Infectious Disease  Date of Admission:  05/14/2022   Total days of inpatient antibiotics 4  Principal Problem:   Type 2 diabetes mellitus with hyperosmolar hyperglycemic state (HHS) ([place]) Active Problems:   Insulin dependent type 2 diabetes mellitus ([name])   Obstructive sleep apnea (adult) (pediatric)   Hypertension   AKI (acute kidney injury) ([place])   S/P transmetatarsal amputation of foot, right ([name])   Chronic pain   Impaired mobility   Chronic kidney disease, stage 3a ([name])   Diastolic HF (heart failure) ([name])   Osteomyelitis of foot, right, acute ([name])   History of Candida glabrata infection, right foot stump infection   Peripheral artery disease, right leg, mild([name])          Assessment: 46 year old female admitted with HHS found to have right foot cellulitis and nonhealing diabetic foot wound.  WBC 15.5 K, patient afebrile, she was started on broad-spectrum antibiotics.  ID engaged.   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  New bone marrow enhancement in the remaining first metatarsal suspicious for recurrent osteomyelitis.     -ID hx: Patient is followed by Dr. Lucilla Edin at [facility] in [place],  [place].  She has had multiple I&D's of her right foot.  Initially underwent TMA on 12/25/2018 with cultures showing mixed GP organisms, rare  anaerobic GNR's and 1+ staph epi treated with moxifloxacin for about 2 months.  She was admitted at [facility] on 12/18/2021 for HHS and nonhealing TMA ulcer and was discharged on Augmentin x4 weeks with MRI showing early osteo.  On 12/27/2021 MRI showed osteomyelitis of first metatarsal and fifth metatarsal  taken to OR with podiatry noted to have ulcer that tracked to the bone, OR cultures grew Candida glabrata patient was discharged on Levaquin, vancomycin, micafungin until 02/09/2022.    Plan  was to do about 6 to 12  months of antifungals.She was trialed on ibrexafungerp but could not tolerate due to nausea. At ID visit on 5/16, she has been off antifungal for about a month. Wanted to avoid PICC line for 6 months and no clear oral options. There was concern about appearance of the wound and the inflammatory marker trend.  Repeat x-ray was ordered and close follow-up.   Recommendations:  -Continue cefepime, metronidazole and vancomycin and Micafungin -Please engage podiatry  for biospy with Cx and pathology. She continues to have purulent  drainage from wound, following about 6 weeks of IV antibiotics. I think she would benefit for input from orthopedics perspective as well for possible amputation.  -Glycemic control for wound healing   Microbiology:   Antibiotics: Cefepime 6/09-p Metronidazole 6/09-p Vancomycin 6/10-p Micafungin 6/12-p Cultures: Blood 6/09 NG   SUBJECTIVE: No new concerns. Interval: Afebrile overnight.  Review of Systems: Review of Systems  All other systems reviewed and are negative.    Scheduled Meds:  aspirin EC  81 mg Oral Daily   Chlorhexidine Gluconate Cloth  6 each Topical Q0600   DULoxetine  60 mg Oral Daily   enoxaparin (LOVENOX) injection  80 mg Subcutaneous Q24H   gabapentin  900 mg Oral TID   insulin aspart  0-20 Units Subcutaneous TID WC  insulin aspart  0-5 Units Subcutaneous QHS   insulin aspart  12 Units Subcutaneous TID WC   insulin glargine-yfgn  40 Units Subcutaneous BID   mouth rinse  15 mL Mouth Rinse BID   metroNIDAZOLE  500 mg Oral Q12H   mometasone-formoterol  2 puff Inhalation BID   prednisoLONE acetate  1 drop Both Eyes QID   revefenacin  175 mcg Nebulization Daily   sodium chloride flush  10-40 mL Intracatheter Q12H   Continuous  Infusions:  ceFEPime (MAXIPIME) IV 200 mL/hr at 05/18/22 0851   lactated ringers Stopped (05/17/22 0500)   micafungin (MYCAMINE) 150 mg in sodium chloride 0.9 % 100 mL IVPB Stopped (05/17/22 2010)   vancomycin Stopped (05/18/22 0519)   PRN Meds:.acetaminophen, albuterol, dextrose, oxyCODONE-acetaminophen, sodium chloride flush Allergies  Allergen Reactions   Cefepime Other (See Comments)    AKI, see records from [place] hospitalization in January 2020.   Other reaction(s): Other (See Comments) "Shut down organs/kidneys"  Note pt has tolerated Rocephin and Keflex   Iodine Other (See Comments)    Kidney dysfunction   Kiwi Extract Shortness Of Breath, Swelling and Anaphylaxis   Morphine And Related Nausea And Vomiting   Nalbuphine Other (See Comments)    Hallucinations     Trental [Pentoxifylline] Nausea And Vomiting   Toradol [Ketorolac Tromethamine] Other (See Comments)    Feels like something is crawling on me   Morphine Nausea And Vomiting   Nubain [Nalbuphine Hcl] Other (See Comments)    "FEELS LIKE SOMETHING CRAWLING ON ME"    OBJECTIVE: Vitals:   05/17/22 2340 05/18/22 0414 05/18/22 0908 05/18/22 1029  BP: (!) 150/100 (!) 147/79  111/84  Pulse: 91 91 97 92  Resp: '20 20 18 18  '$ Temp: 97.8 F (36.6 C) (!) 97.3 F (36.3 C)  98.2 F (36.8 C)  TempSrc: Axillary Axillary  Oral  SpO2: 97% 94% 96% 97%  Weight:  (!) 158.2 kg    Height:       Body mass index is 63.79 kg/m.  Physical Exam Constitutional:      Appearance: Normal appearance.  HENT:     Head: Normocephalic and atraumatic.     Right Ear: Tympanic membrane normal.     Left Ear: Tympanic membrane normal.     Nose: Nose normal.     Mouth/Throat:     Mouth: Mucous membranes are moist.  Eyes:     Extraocular Movements: Extraocular movements intact.     Conjunctiva/sclera: Conjunctivae normal.     Pupils: Pupils are equal, round, and reactive to light.  Cardiovascular:     Rate and Rhythm: Normal rate and  regular rhythm.     Heart sounds: No murmur heard.    No friction rub. No gallop.  Pulmonary:     Effort: Pulmonary effort is normal.     Breath sounds: Normal breath sounds.  Abdominal:     General: Abdomen is flat.     Palpations: Abdomen is soft.  Musculoskeletal:     Comments: Right foot bandaged.   Skin:    General: Skin is warm and dry.  Neurological:     General: No focal deficit present.     Mental Status: She is alert and oriented to person, place, and time.  Psychiatric:        Mood and Affect: Mood normal.       Lab Results Lab Results  Component Value Date   WBC 8.3 05/17/2022   HGB 10.1 (L) 05/17/2022  HCT 31.5 (L) 05/17/2022   MCV 91.3 05/17/2022   PLT 274 05/17/2022    Lab Results  Component Value Date   CREATININE 2.57 (H) 05/18/2022   BUN 59 (H) 05/18/2022   NA 132 (L) 05/18/2022   K 4.0 05/18/2022   CL 94 (L) 05/18/2022   CO2 26 05/18/2022    Lab Results  Component Value Date   ALT 19 04/11/2022   AST 19 04/11/2022   ALKPHOS 164 (H) 04/11/2022   BILITOT 0.4 04/11/2022        Laurice Record, [facility] Infectious [facility] 05/18/2022, 11:03 AM

## 2022-05-18 NOTE — Assessment & Plan Note (Deleted)
Continue IV Micafungin 150 mg daily for now. Will discuss with ID re: duration.

## 2022-05-18 NOTE — Progress Notes (Addendum)
Daily Progress Note Intern Pager: [phone]  Patient name: [name] Medical record number: [id number] Date of birth: 1976/07/27 Age: 46 y.o. Gender: female  Primary Care Provider: [name], MD Consultants: ID, Ortho Code Status: Full  Pt Overview and Major Events to Date:  6/9: Admitted on Endo tool initiated 6/10: Endo tool discontinued, switched to subcu insulin  Assessment and Plan: [name] is a 46 year old female admitted for HHS in the setting of poorly controlled T2DM  Pertinent PMH/PSH includes insulin-dependent T2DM with neuropathy, COPD, LLE AKA, CKD stage III, bipolar 2 disorder, OSA on BiPAP.   * Type 2 diabetes mellitus with hyperosmolar hyperglycemic state (HHS) ([place]) Patient's CBG in the last 24hr ranged from 335 & 354. CBG this morning was 339. She got a total of 44u short acting and 80 basal. Will increase meal time coverage to 20u and continue 40u semglee -Continue 40u Semglee BID -Increase meal tome to 20u Novolg TID  -Continue maintenance IV fluid -switched to resistant sliding scale insulin -Placed on carb modified diet -Continue CBG monitoring  Osteomyelitis of foot, right, acute ([place]) Patient with non healing right diabetic foot ulcer.Endorses pain of her lle, will obtain ABI today and will consider vascular or ortho consult pending result. ID recommend continung abx and add munfungi. Ortho consulted, will follow their recommendation -Ortho following, appreciate recs -ID following, appreciate recs - Continue vancomycin, cefipime, Flagyl  -PO Percocet 5-325 Q6H PRN -PO Tylenol 650 Q6H PRN -Vitals per floor routine     AKI (acute kidney injury) ([place]) Creatinine is up from  2.47 yesterday to 2.57. Patient currently on IV abx likely contributing to her worsening creatinine.  Hyponatremia this morning to 133, calculated corrected NA was 138. Gave patient 1L bolus LR -Increase  fluid resuscitation with LR at 100 mL/h -Continue decrease  Gabapentin to 900 TID -Continue to monitor kidney function with daily BMP labs. -Avoid nephrotoxic agents  History of Candida glabrata infection, right foot stump infection Continue IV Micafungin150 mg daily.  Diastolic HF (heart failure) ([name]) Appears Euvolumic on exam. Weight today is 158kg. -Will continue to hold home torsemide -Monitor daily weights -Monitor intake and output.  Obstructive sleep apnea (adult) (pediatric) On home BiPAP nightly. -continue nightly BiPAP       FEN/GI: Carb modified diet PPx: Lovenox Dispo:Home pending clinical improvement . Barriers include clinical status.   Subjective:  [name] says she is doing  better today and pain on her right lower extremity is mildly improved.  Objective: Temp:  [97.3 F (36.3 C)-98.3 F (36.8 C)] 98.3 F (36.8 C) (06/13 1426) Pulse Rate:  [91-97] 91 (06/13 1426) Resp:  [18-20] 18 (06/13 1426) BP: (111-151)/(73-100) 151/97 (06/13 1426) SpO2:  [87 %-98 %] 98 % (06/13 1426) FiO2 (%):  [32 %] 32 % (06/13 0908) Weight:  [158.2 kg] 158.2 kg (06/13 0414) Physical Exam: General: Alert, well appearing, NAD CV: RRR, no murmurs, normal S1/S2 Pulm: CTAB, good WOB on RA, no crackles or wheezing Abd: Soft, no distension, no tenderness Skin: dry, warm Ext: LLE AKA, localized erythema of the medial right  thigh that is tender to touch.   Laboratory: Most recent CBC Lab Results  Component Value Date   WBC 8.3 05/17/2022   HGB 10.1 (L) 05/17/2022   HCT 31.5 (L) 05/17/2022   MCV 91.3 05/17/2022   PLT 274 05/17/2022   Most recent BMP    Latest Ref Rng & Units 05/18/2022   12:42 AM  BMP  Glucose  70 - 99 mg/dL 339   BUN 6 - 20 mg/dL 59   Creatinine 0.44 - 1.00 mg/dL 2.57   Sodium 135 - 145 mmol/L 132   Potassium 3.5 - 5.1 mmol/L 4.0   Chloride 98 - 111 mmol/L 94   CO2 22 - 32 mmol/L 26   Calcium 8.9 - 10.3 mg/dL 8.8     Imaging/Diagnostic Tests: No new images  Alen Bleacher, MD 05/18/2022, 2:29  PM  PGY-1, [place] Intern pager: [phone], text pages welcome Secure chat group [place]

## 2022-05-18 NOTE — Plan of Care (Signed)
  Problem: Education: Goal: Knowledge of General Education information will improve Description: Including pain rating scale, medication(s)/side effects and non-pharmacologic comfort measures Outcome: Progressing   Problem: Fluid Volume: Goal: Ability to maintain a balanced intake and output will improve Outcome: Progressing   Problem: Health Behavior/Discharge Planning: Goal: Ability to manage health-related needs will improve Outcome: Progressing

## 2022-05-19 DIAGNOSIS — S88119A Complete traumatic amputation at level between knee and ankle, unspecified lower leg, initial encounter: Secondary | ICD-10-CM | POA: Diagnosis not present

## 2022-05-19 DIAGNOSIS — M86171 Other acute osteomyelitis, right ankle and foot: Secondary | ICD-10-CM | POA: Diagnosis not present

## 2022-05-19 DIAGNOSIS — E440 Moderate protein-calorie malnutrition: Secondary | ICD-10-CM

## 2022-05-19 DIAGNOSIS — E1100 Type 2 diabetes mellitus with hyperosmolarity without nonketotic hyperglycemic-hyperosmolar coma (NKHHC): Secondary | ICD-10-CM | POA: Diagnosis not present

## 2022-05-19 LAB — GLUCOSE, CAPILLARY
Glucose-Capillary: 256 mg/dL — ABNORMAL HIGH (ref 70–99)
Glucose-Capillary: 323 mg/dL — ABNORMAL HIGH (ref 70–99)
Glucose-Capillary: 314 mg/dL — ABNORMAL HIGH (ref 70–99)
Glucose-Capillary: 283 mg/dL — ABNORMAL HIGH (ref 70–99)

## 2022-05-19 LAB — BASIC METABOLIC PANEL
Anion gap: 10 (ref 5–15)
BUN: 65 mg/dL — ABNORMAL HIGH (ref 6–20)
CO2: 25 mmol/L (ref 22–32)
Calcium: 8.9 mg/dL (ref 8.9–10.3)
Chloride: 98 mmol/L (ref 98–111)
Creatinine, Ser: 2.15 mg/dL — ABNORMAL HIGH (ref 0.44–1.00)
GFR, Estimated: 28 mL/min — ABNORMAL LOW (ref >60)
Glucose, Bld: 270 mg/dL — ABNORMAL HIGH (ref 70–99)
Potassium: 4.3 mmol/L (ref 3.5–5.1)
Sodium: 133 mmol/L — ABNORMAL LOW (ref 135–145)

## 2022-05-19 LAB — CULTURE, BLOOD (ROUTINE X 2)
Culture: NO GROWTH
Culture: NO GROWTH
Special Requests: ADEQUATE
Special Requests: ADEQUATE

## 2022-05-19 LAB — CK: Total CK: 27 U/L — ABNORMAL LOW (ref 38–234)

## 2022-05-19 MED ORDER — INSULIN GLARGINE-YFGN 100 UNIT/ML ~~LOC~~ SOLN
45.0000 [IU] | Freq: Two times a day (BID) | SUBCUTANEOUS | Status: DC
  Administered 2022-05-19 – 2022-05-20 (×2): 45 [IU] via SUBCUTANEOUS
  Filled 2022-05-19 (×3): qty 0.45

## 2022-05-19 MED ORDER — INSULIN ASPART 100 UNIT/ML IJ SOLN
30.0000 [IU] | Freq: Three times a day (TID) | INTRAMUSCULAR | Status: DC
Start: 2022-05-19 — End: 2022-05-24
  Administered 2022-05-19 – 2022-05-23 (×9): 30 [IU] via SUBCUTANEOUS

## 2022-05-19 NOTE — Consult Note (Signed)
ORTHOPAEDIC CONSULTATION  REQUESTING PHYSICIAN: McDiarmid, Blane Ohara, MD  Chief Complaint: Persistent ulceration right foot status post transmetatarsal amputation.  HPI: [name] is a 46 y.o. female who presents with persistent ulceration right transmetatarsal amputation over the first metatarsal.  Patient is status post trans metatarsal amputation on the right and states that she has had 3 revision surgeries at [facility] still with persistent ulceration.  Patient is also status post a left transtibial amputation by myself and March 2019 that  had wound dehiscence and was revised at [facility].  Past Medical History:  Diagnosis Date   Abscess of groin, left    Acute on chronic diastolic (congestive) heart failure ([place]) 02/05/2022   Acute on chronic respiratory failure with hypoxia ([name]) 04/11/2022   Acute osteomyelitis of ankle or foot, left ([place]) 01/31/2018   Acute respiratory failure with hypoxia and hypercarbia ([place]) 06/15/2020   Alveolar hypoventilation    Amputation stump infection ([place]) 04/09/2018   Anemia    not on iron pill   Asthma    Bipolar 2 disorder ([name])    Candidiasis of vagina 05/15/2022   Carpal tunnel syndrome on right    recurrent   Cellulitis 08/2010-08/2011   Chest pain with low risk for cardiac etiology 05/11/2021   Chronic pain    COPD (chronic obstructive pulmonary disease) ([name])    Symbicort daily and Proventil as needed   Costochondritis    De Quervain's tenosynovitis, bilateral 11/01/2015   Depression    Diabetes mellitus type II, uncontrolled 2000   Type 2, Uncontrolled.Takes Lantus daily.Fasting blood sugar runs 150   Diabetic ulcer of right foot ([place]) 12/19/2021   Drug-seeking behavior    Elevated troponin    Exposure to mold 04/21/2020   GERD (gastroesophageal reflux disease)    takes Pantoprazole and Zantac daily   HLD (hyperlipidemia)    takes Atorvastatin daily   Hypertension    takes Lisinopril and Coreg daily   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[place]) 04/11/2019    Left shoulder pain 06/23/2019   Morbid obesity ([place])    Morbid obesity with BMI of 60.0-69.9, adult ([place]) 04/15/2021   Nocturia    Nonobstructive atherosclerosis of coronary artery 11/26/2020   11/26/20 Coronary angiogram: 1.  Mild diffuse proximal LAD stenosis with no evidence of obstructive disease (20 to 30% diffuse stenosis);  2.  Left dominant circumflex with widely patent obtuse marginal branches and left PDA branch, no significant stenoses present  3.  Nondominant RCA with mild diffuse nonobstructive stenosis    OSA on CPAP    Peripheral neuropathy    takes Gabapentin daily   Pneumonia    "walking" several yrs ago and as a baby (12/05/2018)   Pneumonia due to COVID-19 virus 04/14/2021   Primary osteoarthritis of first carpometacarpal joint of left hand 07/30/2016   Rectal fissure    Restless leg    Right carpal tunnel syndrome 09/01/2011   SVT (supraventricular tachycardia) ([place])    Syncope 02/25/2016   Thrombocytosis 04/11/2022   Urinary frequency    Urinary incontinence 10/23/2020   UTI (urinary tract infection) 04/15/2021   Varicose veins    Right medial thigh and Left leg    Past Surgical History:  Procedure Laterality Date   AMPUTATION Left 02/01/2018   Procedure: LEFT FOURTH AND 5TH TOE RAY AMPUTATION;  Surgeon: [name], MD;  Location: [place];  Service: Orthopedics;  Laterality: Left;   AMPUTATION Left 03/03/2018   Procedure: LEFT BELOW KNEE AMPUTATION;  Surgeon: [name],  MD;  Location: [place];  Service: Orthopedics;  Laterality: Left;   CARPAL TUNNEL RELEASE Bilateral    CESAREAN SECTION  2007   CORONARY ANGIOGRAPHY N/A 11/26/2020   Procedure: CORONARY ANGIOGRAPHY;  Surgeon: Sherren Mocha, MD;  Location: [place] [facility];  Service: Cardiovascular;  Laterality: N/A;   FOOT AMPUTATION Right    INCISION AND DRAINAGE ABSCESS Left 10/16/2021   Procedure: INCISION AND DRAINAGE ABSCESS;  Surgeon: Dwan Bolt, MD;  Location: [place];  Service: General;   Laterality: Left;   INCISION AND DRAINAGE PERIRECTAL ABSCESS Left 05/18/2019   Procedure: IRRIGATION AND DEBRIDEMENT OF PANNIS ABSCESS, POSSIBLE DEBRIDEMENT OF BUTTOCK WOUND;  Surgeon: Donnie Mesa, MD;  Location: [place];  Service: General;  Laterality: Left;   IRRIGATION AND DEBRIDEMENT BUTTOCKS Left 05/17/2019   Procedure: IRRIGATION AND DEBRIDEMENT BUTTOCKS;  Surgeon: Donnie Mesa, MD;  Location: [place];  Service: General;  Laterality: Left;   KNEE ARTHROSCOPY Right 07/17/2010   LEFT HEART CATHETERIZATION WITH CORONARY ANGIOGRAM N/A 07/27/2012   Procedure: LEFT HEART CATHETERIZATION WITH CORONARY ANGIOGRAM;  Surgeon: Sherren Mocha, MD;  Location[facility] CATH LAB;  Service: Cardiovascular;  Laterality: N/A;   MASS EXCISION N/A 06/29/2013   Procedure:  WIDE LOCAL EXCISION OF POSTERIOR NECK ABSCESS;  Surgeon: Ralene Ok, MD;  Location: [place];  Service: General;  Laterality: N/A;   REPAIR KNEE LIGAMENT Left    "fixed ligaments and chipped patella"   right transmetatarsal amputation      Social History   Socioeconomic History   Marital status: Married    Spouse name: Not on file   Number of children: Not on file   Years of education: Not on file   Highest education level: Not on file  Occupational History   Not on file  Tobacco Use   Smoking status: Former    Packs/day: 0.25    Years: 15.00    Total pack years: 3.75    Types: Cigarettes    Quit date: 12/06/2005    Years since quitting: 16.4   Smokeless tobacco: Never   Tobacco comments:    smokes for a couple of months  Vaping Use   Vaping Use: Never used  Substance and Sexual Activity   Alcohol use: No    Alcohol/week: 0.0 standard drinks of alcohol   Drug use: Not Currently    Comment: OD attempts on home meds     Sexual activity: Yes    Partners: Male  Other Topics Concern   Not on file  Social History Narrative   Lives in [place] with her fiance and 44 yr old dtr.   Social Determinants of Health   Financial Resource  Strain: High Risk (02/19/2021)   Overall Financial Resource Strain (CARDIA)    Difficulty of Paying Living Expenses: Hard  Food Insecurity: Food Insecurity Present (02/19/2021)   Hunger Vital Sign    Worried About Running Out of Food in the Last Year: Often true    Ran Out of Food in the Last Year: Often true  Transportation Needs: No Transportation Needs (02/19/2021)   PRAPARE - Hydrologist (Medical): No    Lack of Transportation (Non-Medical): No  Physical Activity: Not on file  Stress: Stress Concern Present (02/19/2021)   [place]    Feeling of Stress : Very much  Social Connections: Not on file   Family History  Problem Relation Age of Onset   Diabetes Mother  Hyperlipidemia Mother    Depression Mother    GER disease Mother    Allergic rhinitis Mother    Restless legs syndrome Mother    Heart attack Paternal Uncle    Heart disease Paternal Grandmother    Heart attack Paternal Grandmother    Heart attack Paternal Grandfather    Heart disease Paternal Grandfather    Heart attack Father    Migraines Sister    Cancer Maternal Grandmother        COLON   Hypertension Maternal Grandmother    Hyperlipidemia Maternal Grandmother    Diabetes Maternal Grandmother    Other Maternal Grandfather        GUN SHOT   Anxiety disorder Sister    Asthma Child    - negative except otherwise stated in the family history section Allergies  Allergen Reactions   Cefepime Other (See Comments)    AKI, see records from [place] hospitalization in January 2020.   Other reaction(s): Other (See Comments) "Shut down organs/kidneys"  Note pt has tolerated Rocephin and Keflex   Iodine Other (See Comments)    Kidney dysfunction   Kiwi Extract Shortness Of Breath, Swelling and Anaphylaxis   Morphine And Related Nausea And Vomiting   Nalbuphine Other (See Comments)    Hallucinations     Trental  [Pentoxifylline] Nausea And Vomiting   Toradol [Ketorolac Tromethamine] Other (See Comments)    Feels like something is crawling on me   Morphine Nausea And Vomiting   Nubain [Nalbuphine Hcl] Other (See Comments)    "FEELS LIKE SOMETHING CRAWLING ON ME"   Prior to Admission medications   Medication Sig Start Date End Date Taking? Authorizing Provider  albuterol (VENTOLIN HFA) 108 (90 Base) MCG/ACT inhaler INHALE 1 TO 2 PUFFS BY MOUTH EVERY SIX HOURS AS NEEDED FOR WHEEZING OR SHORTNESS OF BREATH Patient taking differently: Inhale 1 puff into the lungs every 6 (six) hours as needed for shortness of breath. 07/16/21  Yes [name], MD  aspirin EC 81 MG tablet Take 1 tablet (81 mg total) by mouth daily. Swallow whole. 11/19/20  Yes Sherren Mocha, MD  cetirizine (ZYRTEC) 10 MG tablet Take 1 tablet (10 mg total) by mouth daily as needed for allergies. 02/11/22  Yes [name], MD  Cholecalciferol 1000 units tablet Take 1 tablet (1,000 Units total) by mouth daily. 04/05/17  Yes [name], MD  DULoxetine (CYMBALTA) 30 MG capsule Take 1 capsule (30 mg total) by mouth daily. X 1 week; then 60 mg daily- for knee and back/nerve pain Patient taking differently: Take 60 mg by mouth daily. 02/15/22  Yes Lovorn, Jinny Blossom, MD  gabapentin (NEURONTIN) 600 MG tablet TAKE 2 TABLETS BY MOUTH 3 TIMES DAILY Patient taking differently: Take 1,200 mg by mouth in the morning, at noon, and at bedtime. 04/16/22  Yes [name], MD  Glucagon (BAQSIMI ONE PACK) 3 MG/DOSE POWD Place 3 mg into the nose as needed (low blood sugar).   Yes [provider]  Glucagon, rDNA, (GLUCAGON EMERGENCY) 1 MG KIT Inject 1 mg into the muscle as needed for low blood sugar. 11/03/21  Yes [provider]  insulin aspart (NOVOLOG FLEXPEN) 100 UNIT/ML FlexPen Inject 20 Units into the skin 3 (three) times daily with meals. Patient taking differently: Inject 30 Units into the skin 3 (three) times  daily with meals. Sliding scale 10/25/21  Yes Brimage, Vondra, DO  insulin degludec (TRESIBA FLEXTOUCH) 200 UNIT/ML FlexTouch Pen Inject 46 Units into the skin  2 (two) times daily. Patient taking differently: Inject 30 Units into the skin 2 (two) times daily. 10/25/21  Yes Brimage, Vondra, DO  nystatin (MYCOSTATIN/NYSTOP) powder Apply 1 application. topically 3 (three) times daily. 03/30/22  Yes [name], MD  ondansetron (ZOFRAN) 4 MG tablet Take 1 tablet (4 mg total) by mouth every 8 (eight) hours as needed for nausea or vomiting. 02/17/22  Yes Lovorn, Jinny Blossom, MD  oxyCODONE-acetaminophen (PERCOCET) 7.5-325 MG tablet Take 1 tablet by mouth every 6 (six) hours as needed for severe pain. 04/27/22  Yes [name], MD  potassium chloride SA (KLOR-CON M20) 20 MEQ tablet Take 1 tablet (20 mEq total) by mouth daily. 09/09/20  Yes Weaver, Scott T, PA-C  prednisoLONE acetate (PRED FORTE) 1 % ophthalmic suspension Place 1 drop into both eyes 4 (four) times daily.   Yes [provider]  ramelteon (ROZEREM) 8 MG tablet Take 1 tablet (8 mg total) by mouth at bedtime. 03/23/22  Yes [name], MD  SYMBICORT 160-4.5 MCG/ACT inhaler inhale 2 PUFFS into THE lungs 2 TIMES DAILY Patient taking differently: 2 puffs every 6 (six) hours. Shortness of breath 01/04/22  Yes Hensel, Jamal Collin, MD  Tiotropium Bromide Monohydrate (SPIRIVA RESPIMAT) 1.25 MCG/ACT AERS Inhale 2 puffs into the lungs daily. 11/14/20  Yes Martyn Ehrich, NP  torsemide (DEMADEX) 20 MG tablet Take 14m in am and 426min pm Patient taking differently: Take 40-80 mg by mouth See admin instructions. Take 4 tablets (80 mg) in am and 2 tablets (40 mg) in pm 04/14/22  Yes XuFlorencia ReasonsMD  TRULICITY 3 MGTF/5.7DUOPN Inject 3 mg into the skin once a week. Sundays 01/04/22  Yes [provider]  Accu-Chek FastClix Lancets MISC USE DAILY AS INSTRUCTED 05/18/21   McLeeanne RioMD  ACCU-CHEK GUIDE test strip USE T0  CHECK BLOOD SUGAR 3 TIMES DAILY 11/05/21   McLeeanne RioMD  Accu-Chek Softclix Lancets lancets Use to check blood glucose four times daily 04/17/20   BrMartyn MalayMD  Continuous Blood Gluc Sensor (DEXCOM G6 SENSOR) MISC Inject 1 applicator into the skin as directed. Change sensor every 10 days. 11/05/21   McLeeanne RioMD  Continuous Blood Gluc Transmit (DEXCOM G6 TRANSMITTER) MISC Inject 1 Device into the skin as directed. Reuse 8 times with sensor changes. 09/03/21   McLeeanne RioMD  GNP ULTICARE PEN NEEDLES 32G X 4 MM MISC USE TO inject insulin 2 TIMES DAILY 10/02/21   McLeeanne RioMD   VAS [place]BI WITH/WO TBI  Result Date: 05/18/2022  LOWER EXTREMITY DOPPLER STUDY Patient Name:  TAFRANCISCA LANGENDERFERDate of Exam:   05/18/2022 Medical Rec #: [id number]   Accession #:    [id number] of Birth: 02/11/16/1977    Patient Gender: F Patient Age:   [age]ears [facility]      VAS [place] WITH/WO TBI Referring Phys: TODD MCDIARMID --------------------------------------------------------------------------------  Indications: Ulceration. High Risk Factors: Hypertension, hyperlipidemia, Diabetes. Other Factors: H/o left BKA, right transmetatarsal amputation.  Comparison Study: 09/01/17 ABI- right=1.1, left=1.49 Performing Technologist: SiMaudry MayhewHA, RVT, RDCS, RDMS  Examination Guidelines: A complete evaluation includes at minimum, Doppler waveform signals and systolic blood pressure reading at the level of bilateral brachial, anterior tibial, and posterior tibial arteries, when vessel segments are accessible. Bilateral testing is considered an integral part of a complete examination. Photoelectric Plethysmograph (PPG) waveforms and toe systolic  pressure readings are included as required and additional duplex testing as needed. Limited examinations for reoccurring indications may be performed as noted.  ABI Findings:  +---------+------------------+-----+---------+---------------------------------+ Right    Rt Pressure (mmHg)IndexWaveform Comment                           +---------+------------------+-----+---------+---------------------------------+ Brachial                                 Unable to evaluate due to PICC                                             line                              +---------+------------------+-----+---------+---------------------------------+ PTA                                      Absent- however may be unable to                                           insonate secondary to limitations +---------+------------------+-----+---------+---------------------------------+ DP       131               0.98 triphasic                                  +---------+------------------+-----+---------+---------------------------------+ Great Toe                                Amputation                        +---------+------------------+-----+---------+---------------------------------+ +---------+------------------+-----+---------+-------+ Left     Lt Pressure (mmHg)IndexWaveform Comment +---------+------------------+-----+---------+-------+ Brachial 133                    triphasic        +---------+------------------+-----+---------+-------+ PTA                                      BKA     +---------+------------------+-----+---------+-------+ DP                                       BKA     +---------+------------------+-----+---------+-------+ Great Toe                                BKA     +---------+------------------+-----+---------+-------+ +-------+-----------+-----------+------------+------------+ ABI/TBIToday's ABIToday's TBIPrevious ABIPrevious TBI +-------+-----------+-----------+------------+------------+ Right  0.98       amp                                  +-------+-----------+-----------+------------+------------+  Left   BKA        BKA                                 +-------+-----------+-----------+------------+------------+ Right ABIs appear essentially unchanged compared to prior study on 09/01/2017.  Summary: Right: Resting right ankle-brachial index is within normal range.  *See table(s) above for measurements and observations.  Electronically signed by Deitra Mayo MD on 05/18/2022 at 3:09:36 PM.    Final    - pertinent xrays, CT, MRI studies were reviewed and independently interpreted  Positive ROS: All other systems have been reviewed and were otherwise negative with the exception of those mentioned in the HPI and as above.  Physical Exam: General: Alert, no acute distress Psychiatric: Patient is competent for consent with normal mood and affect Lymphatic: No axillary or cervical lymphadenopathy Cardiovascular: No pedal edema Respiratory: No cyanosis, no use of accessory musculature GI: No organomegaly, abdomen is soft and non-tender    Images:  '@ENCIMAGES' @  Labs:  Lab Results  Component Value Date   HGBA1C 14.9 (H) 05/16/2022   HGBA1C >15.5 (H) 12/19/2021   HGBA1C >15.0 09/29/2021   ESRSEDRATE 115 (H) 05/14/2022   ESRSEDRATE 63 (H) 02/07/2022   ESRSEDRATE >140 (H) 06/22/2019   CRP 22.6 (H) 05/14/2022   CRP 2.0 (H) 02/07/2022   CRP 1.8 (H) 04/18/2021   LABURIC 10.4 (H) 05/18/2022   REPTSTATUS 05/19/2022 FINAL 05/14/2022   GRAMSTAIN  10/16/2021    FEW WBC PRESENT,BOTH PMN AND MONONUCLEAR ABUNDANT GRAM VARIABLE ROD ABUNDANT GRAM POSITIVE COCCI    CULT  05/14/2022    NO GROWTH 5 DAYS Performed at [facility], [place] [address] [place], [place] [address]    LABORGA STAPHYLOCOCCUS AUREUS 02/06/2021    Lab Results  Component Value Date   ALBUMIN 2.8 (L) 04/14/2022   ALBUMIN 2.8 (L) 04/13/2022   ALBUMIN 3.0 (L) 04/11/2022   LABURIC 10.4 (H) 05/18/2022        Latest Ref Rng & Units 05/17/2022     2:03 AM 05/16/2022    1:43 AM 05/15/2022    3:51 AM  CBC EXTENDED  WBC 4.0 - 10.5 K/uL 8.3  10.2  15.5   RBC 3.87 - 5.11 MIL/uL 3.45  3.69  4.25   Hemoglobin 12.0 - 15.0 g/dL 10.1  10.8  12.3   HCT 36.0 - 46.0 % 31.5  33.2  37.6   Platelets 150 - 400 K/uL 274  262  277   NEUT# 1.7 - 7.7 K/uL   10.9   Lymph# 0.7 - 4.0 K/uL   3.4     Neurologic: Patient does not have protective sensation bilateral lower extremities.   MUSCULOSKELETAL:   Skin: Examination of the left transtibial amputation this is well-healed and well consolidated.  Patient states she does have a prosthesis for the left.  Examination of the right transmetatarsal amputation patient has a fixed equinus contracture of about 30 degrees with a ulcer over the first metatarsal.  Ulcer is approximately 2 cm in diameter and there is exposed bone.  Review of the MRI scan does show edema in the first metatarsal consistent with the clinical findings of osteomyelitis.  Patient has no other ulcers or ascending cellulitis on the right lower extremity.  I cannot palpate a dorsalis pedis pulse secondary to swelling but the Doppler does show a dorsalis pedis ankle-brachial indices of 0.98 with triphasic flow.  Patient's hemoglobin is  10.1 albumin 2.8 with a hemoglobin A1c of 14.9 and was greater than 15 earlier this year.  Assessment: Assessment: Uncontrolled type 2 diabetes with protein caloric malnutrition with osteomyelitis and ulceration right transmetatarsal amputation with fixed equinus contracture.  Plan: Plan: Discussed with the patient that at a minimum she would require revision of the transmetatarsal amputation and Achilles lengthening with possible gastrocnemius recession.  Risks and benefits were discussed including risk of recurrent ulceration if the equinus contracture was not addressed.  Patient states she understands she states she would like to have another doctor perform the surgery since the transtibial amputation on the  left that I performed required revision surgery.  I will be available to assist in any way I can.  Thank you for the consult and the opportunity to see [name], [place] [phone] 5:51 PM

## 2022-05-19 NOTE — Progress Notes (Signed)
Daily Progress Note Intern Pager: [phone]  Patient name: [name] Medical record number: [id number] Date of birth: 05/28/1976 Age: 46 y.o. Gender: female  Primary Care Provider: [name], MD Consultants: Manson Passey, ID Code Status: Full  Pt Overview and Major Events to Date:  6/9: Admitted, Endo tool initiated 6/10: Endo tool discontinued Assessment and Plan: [name] is a 46 year old female admitted for HHS in the setting of poorly controlled T2DM   Pertinent PMH/PSH includes insulin-dependent T2DM with neuropathy, COPD, LLE AKA, CKD stage III, bipolar disorder, OSA on BiPAP.   * Type 2 diabetes mellitus with hyperosmolar hyperglycemic state (HHS) ([place]) Patient's CBG RANGED 270-395. CBG this morning was 256.  Patient got 101 SAI and 80 basal.  We will look to increase to 30 units NovoLog 3 times daily. -Increase to 45u Semglee BID -Increase meal time to 30u Novolg TID  -Continue maintenance IV fluid -switched to resistant sliding scale insulin -Placed on carb modified diet -Continue CBG monitoring  Osteomyelitis of foot, right, acute ([place]) Patient with nonhealing right foot diabetic ulcer.  Found to have osteomyelitis of the right foot and Ortho has been consulted, will follow their recommendations -Ortho following, appreciate recs -ID following, appreciate recs - Continue vancomycin, cefipime, Flagyl  -PO Percocet 5-325 Q6H PRN -PO Tylenol 650 Q6H PRN -Vitals per floor routine     AKI (acute kidney injury) ([place]) Currently and this morning improved to 2.15 from 2.57 yesterday.  We will continue with fluid resuscitation and encouraged to p.o. fluid intake. -Continue fluid resuscitation with LR at 100 mL/h -Continue to monitor kidney function with daily BMP labs. -Avoid nephrotoxic agents  History of Candida glabrata infection, right foot stump infection Continue IV Micafungin150 mg daily  Diastolic HF (heart failure) ([place]) . Weight today is  158.2kg. -Will continue to hold home torsemide -Monitor daily weights -Monitor intake and output.  Obstructive sleep apnea (adult) (pediatric) On home BiPAP nightly -continue nightly BiPAP       FEN/GI: Carb modified diet PPx: Lovenox Dispo:CIR pending clinical improvement .   Subjective:  [name] was laying down this morning.  She says she is feeling much better and RLE pain is improved.  Objective: Temp:  [98 F (36.7 C)-98.3 F (36.8 C)] 98 F (36.7 C) (06/13 2015) Pulse Rate:  [86-95] 86 (06/13 2224) Resp:  [18] 18 (06/13 2224) BP: (136-151)/(81-97) 136/81 (06/13 2015) SpO2:  [90 %-98 %] 96 % (06/13 2224) Physical Exam: General: Alert, well appearing, NAD CV: RRR, no murmurs, normal S1/S2 Pulm: CTAB, good WOB on RA, no crackles or wheezing Abd: Soft, no distension, no tenderness Skin: dry, warm Ext: LLE AKA, improved erythema in the right medial thigh.   Laboratory: Most recent CBC Lab Results  Component Value Date   WBC 8.3 05/17/2022   HGB 10.1 (L) 05/17/2022   HCT 31.5 (L) 05/17/2022   MCV 91.3 05/17/2022   PLT 274 05/17/2022   Most recent BMP    Latest Ref Rng & Units 05/19/2022    3:28 AM  BMP  Glucose 70 - 99 mg/dL 270   BUN 6 - 20 mg/dL 65   Creatinine 0.44 - 1.00 mg/dL 2.15   Sodium 135 - 145 mmol/L 133   Potassium 3.5 - 5.1 mmol/L 4.3   Chloride 98 - 111 mmol/L 98   CO2 22 - 32 mmol/L 25   Calcium 8.9 - 10.3 mg/dL 8.9      Imaging/Diagnostic Tests: No new images  Alen Bleacher, MD  05/19/2022, 2:10 PM  PGY-1, [place] Intern pager: [phone], text pages welcome Secure chat group [place]

## 2022-05-19 NOTE — TOC Progression Note (Signed)
Transition of Care [facility]) - Progression Note    Patient Details  Name: [name] MRN: [id number] Date of Birth: January 01, 1976  Transition of Care [facility]) CM/SW [place], RN Phone Number: 05/19/2022, 3:19 PM  Clinical Narrative:    CM and MSW with DTP Team are continuing to follow the patient for TOC needs.  The patient was evaluated by PT this afternoon and recommendations are for Inpatient rehabilitation placement.  I sent a message to attending physician, Alphonzo Dublin, requesting physician order for Consult for Rehab MD for possible evaluation for CIR and placement.  CM with DTP will continue to follow and assist for placement needs - S/P right transmetatarsal amputation.   Expected Discharge Plan: IP Rehab Facility Barriers to Discharge: Continued Medical Work up  Expected Discharge Plan and Services Expected Discharge Plan: [place]                                               Social Determinants of Health (SDOH) Interventions    Readmission Risk Interventions     No data to display

## 2022-05-19 NOTE — Progress Notes (Addendum)
[facility] for Infectious Disease  Date of Admission:  05/14/2022   Total days of inpatient antibiotics 4  Principal Problem:   Type 2 diabetes mellitus with hyperosmolar hyperglycemic state (HHS) ([place]) Active Problems:   Insulin dependent type 2 diabetes mellitus ([name])   Obstructive sleep apnea (adult) (pediatric)   Hypertension   AKI (acute kidney injury) ([place])   Chronic kidney disease, stage 3a ([name])   Diastolic HF (heart failure) ([name])   Osteomyelitis of foot, right, acute ([name])   History of Candida glabrata infection, right foot stump infection   Peripheral artery disease, right leg, mild([name])          Assessment: 46 year old female admitted with HHS found to have right foot cellulitis and nonhealing diabetic foot wound.  WBC 15.5 K, patient afebrile, she was started on broad-spectrum antibiotics.  ID engaged.   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  New bone marrow enhancement in the remaining first metatarsal suspicious for recurrent osteomyelitis.     -ID hx: Patient is followed by Dr. Lucilla Edin at [facility] in [place],  [place].  She has had multiple I&D's of her right foot.  Initially underwent TMA on 1/202020 with cultures showing mixed GP organisms,   anaerobic GNR's and 1+ staph epi treated with moxifloxacin for about 2 months.  She was admitted at [facility] on 12/18/2021 for HHS and nonhealing TMA ulcer and was discharged on Augmentin x4 weeks with MRI showing early osteo.  On 12/27/2021 MRI showed osteomyelitis of first metatarsal and fifth metatarsal taken to OR with podiatry noted to have ulcer that tracked to the bone, OR cultures grew Candida  glabrata patient was discharged on Levaquin, vancomycin, micafungin until 02/09/2022.    Plan  was to do about 6 to 12  months of antifungals.She was trialed on ibrexafungerp but could not tolerate due to nausea. At ID visit on 5/16, she has been off antifungal for about a month. Wanted to avoid PICC line for 6 months and no clear oral options. There was concern about appearance of the wound and the inflammatory marker trend.  Repeat x-ray was ordered and close follow-up.  -R sided ABI normal -Ortho engaged and pt amenable to surgical intervention Recommendations:  -Continue cefepime, metronidazole and vancomycin and Micafungin -Follow-up ortho plan -Glycemic control for wound healing   Microbiology:   Antibiotics: Cefepime 6/09-p Metronidazole 6/09-p Vancomycin 6/10-p Micafungin 6/12-p Cultures: Blood 6/09 NG   SUBJECTIVE: No new concerns. No acute overnight events.  Interval: Afebrile overnight.  Review of Systems: Review of Systems  All other systems reviewed and are negative.    Scheduled Meds:  aspirin EC  81 mg Oral Daily   Chlorhexidine Gluconate Cloth  6 each Topical Q0600   DULoxetine  60 mg Oral Daily   enoxaparin (LOVENOX) injection  80 mg Subcutaneous Q24H   gabapentin  900 mg Oral TID   insulin aspart  0-20 Units Subcutaneous TID WC   insulin aspart  0-5 Units Subcutaneous QHS   insulin aspart  20 Units Subcutaneous TID WC   insulin glargine-yfgn  40 Units Subcutaneous BID   mouth rinse  15 mL Mouth Rinse BID   metroNIDAZOLE  500 mg Oral Q12H   mometasone-formoterol  2 puff Inhalation BID   prednisoLONE acetate  1 drop Both Eyes QID   revefenacin  175 mcg Nebulization Daily   sodium chloride flush  10-40 mL Intracatheter Q12H   Continuous Infusions:  ceFEPime (MAXIPIME) IV Stopped (05/18/22 2257)   DAPTOmycin (CUBICIN) 750 mg in sodium chloride 0.9 % IVPB Stopped (05/18/22 2218)   lactated ringers 125 mL/hr at 05/19/22 0607   micafungin (MYCAMINE) 150 mg  in sodium chloride 0.9 % 100 mL IVPB Stopped (05/18/22 1712)   PRN Meds:.acetaminophen, albuterol, dextrose, oxyCODONE-acetaminophen, sodium chloride flush Allergies  Allergen Reactions   Cefepime Other (See Comments)    AKI, see records from [facility] hospitalization in January 2020.   Other reaction(s): Other (See Comments) "Shut down organs/kidneys"  Note pt has tolerated Rocephin and Keflex   Iodine Other (See Comments)    Kidney dysfunction   Kiwi Extract Shortness Of Breath, Swelling and Anaphylaxis   Morphine And Related Nausea And Vomiting   Nalbuphine Other (See Comments)    Hallucinations     Trental [Pentoxifylline] Nausea And Vomiting   Toradol [Ketorolac Tromethamine] Other (See Comments)    Feels like something is crawling on me   Morphine Nausea And Vomiting   Nubain [Nalbuphine Hcl] Other (See Comments)    "FEELS LIKE SOMETHING CRAWLING ON ME"    OBJECTIVE: Vitals:   05/18/22 2013 05/18/22 2015 05/18/22 2053 05/18/22 2224  BP:  136/81    Pulse: 95 93  86  Resp: '18 18  18  '$ Temp:  98 F (36.7 C)    TempSrc:      SpO2: 94% 91% 90% 96%  Weight:      Height:       Body mass index is 63.79 kg/m.  Physical Exam Constitutional:      Appearance: Normal appearance.  HENT:     Head: Normocephalic and atraumatic.     Right Ear: Tympanic membrane normal.     Left Ear: Tympanic membrane normal.     Nose: Nose normal.     Mouth/Throat:     Mouth: Mucous membranes are moist.  Eyes:     Extraocular Movements: Extraocular movements intact.     Conjunctiva/sclera: Conjunctivae normal.     Pupils: Pupils are equal, round, and reactive to light.  Cardiovascular:     Rate and Rhythm: Normal rate and regular rhythm.     Heart sounds: No murmur heard.    No friction rub. No gallop.  Pulmonary:     Effort: Pulmonary effort is normal.     Breath sounds: Normal breath sounds.  Abdominal:     General: Abdomen is flat.     Palpations: Abdomen is soft.   Musculoskeletal:     Comments: Right foot bandaged.   Skin:    General: Skin is warm and dry.  Neurological:     General: No focal deficit present.     Mental Status: She is alert and oriented to person, place, and time.  Psychiatric:        Mood and Affect: Mood normal.       Lab Results Lab Results  Component Value Date   WBC 8.3 05/17/2022   HGB 10.1 (L) 05/17/2022   HCT 31.5 (L) 05/17/2022   MCV 91.3 05/17/2022   PLT 274 05/17/2022    Lab Results  Component Value Date   CREATININE 2.15 (H) 05/19/2022   BUN 65 (H) 05/19/2022   NA 133 (L) 05/19/2022  K 4.3 05/19/2022   CL 98 05/19/2022   CO2 25 05/19/2022    Lab Results  Component Value Date   ALT 19 04/11/2022   AST 19 04/11/2022   ALKPHOS 164 (H) 04/11/2022   BILITOT 0.4 04/11/2022        Laurice Record, [place] [facility] Infectious [facility] 05/19/2022, 8:58 AM

## 2022-05-19 NOTE — Evaluation (Signed)
Physical Therapy Evaluation Patient Details Name: [name] MRN: [id number] DOB: Apr 24, 1976 Today's Date: 05/19/2022  History of Present Illness  46 y.o. female who presented  05/14/22 with hypergylcemic event with CBG over 600 for the last 3 days, also endorses chest pain. +cellulitis rt foot with nonhealing diabetic foot ulcer. MRI showing Increased soft tissue ulceration medially in the forefoot abutting the 1st metatarsal remnant; suspicious for osteomyelitis. PMH: type 2 diabetes, morbid obesity, obesity hypoventilation syndrome, OSA wears BiPAP at night, hypertension, asthma, GERD, hyperlipidemia, restless leg syndrome, left BKA, R transmetatarsal amputation, mood disorder, hypertension, history of chest pain  Clinical Impression  Pt was seen for initial evaluation and discussion about her limitations of mobility and goals for acute and follow up rehab.  Pt is hoping to recover her strength in BLE's with management of RLE pain.  Her plan is not necessarily to have her prosthesis return to hosp but understands she will need to have it for inpt therapy following up.  Focus on sliding board and get her into a chair toward discharge to CIR if possible.  Pt has dense assistance at home for follow up and should be able to be successful in a more intensive therapy setting.  Follow for acute PT goals as outlined below.     Recommendations for follow up therapy are one component of a multi-disciplinary discharge planning process, led by the attending physician.  Recommendations may be updated based on patient status, additional functional criteria and insurance authorization.  Follow Up Recommendations Acute inpatient rehab (3hours/day)    Assistance Recommended at Discharge Intermittent Supervision/Assistance  Patient can return home with the following  A lot of help with walking and/or transfers;A lot of help with bathing/dressing/bathroom;Assistance with cooking/housework;Assist for  transportation;Help with stairs or ramp for entrance    Equipment Recommendations None recommended by PT  Recommendations for Other Services  Rehab consult    Functional Status Assessment Patient has had a recent decline in their functional status and demonstrates the ability to make significant improvements in function in a reasonable and predictable amount of time.     Precautions / Restrictions Precautions Precautions: Fall Precaution Comments: monitor vitals Restrictions Weight Bearing Restrictions: No Other Position/Activity Restrictions: does not have LLE prosthetic in room      Mobility  Bed Mobility Overal bed mobility: Needs Assistance Bed Mobility: Supine to Sit, Sit to Supine     Supine to sit: Min guard Sit to supine: Min guard   General bed mobility comments: rolling and sitting up with bed rail and HOB elevated.    Transfers Overall transfer level: Needs assistance Equipment used: 1 person hand held assist Transfers: Bed to chair/wheelchair/BSC (practice sliding)            Lateral/Scoot Transfers: Min assist General transfer comment: pt did not transfer to chair but declined this    Ambulation/Gait               General Gait Details: unable to attempt due to LLE being without prosthesis  Stairs            Wheelchair Mobility    Modified Rankin (Stroke Patients Only)       Balance Overall balance assessment: Needs assistance Sitting-balance support: Bilateral upper extremity supported Sitting balance-Leahy Scale: Fair Sitting balance - Comments: pt is unable to reach behind herself to get to her phone       Standing balance comment: unable to attempt  Pertinent Vitals/Pain Pain Assessment Pain Assessment: Faces Faces Pain Scale: Hurts whole lot Pain Location: RLE Pain Descriptors / Indicators: Grimacing, Stabbing Pain Intervention(s): Limited activity within patient's tolerance,  Monitored during session, Premedicated before session, Repositioned, Other (comment) (touch on RLE at knee is the source of the pain)    Home Living Family/patient expects to be discharged to:: Private residence Living Arrangements: Children;Non-relatives/Friends Available Help at Discharge: Family;Personal care attendant;Available 24 hours/day;Friend(s) Type of Home: Apartment Home Access: Level entry       Home Layout: One level Home Equipment: Rolling Walker (2 wheels);BSC/3in1;Grab bars - tub/shower;Tub bench;Wheelchair - manual Additional Comments: has LLE prosthesis, not in room    Prior Function Prior Level of Function : Needs assist             Mobility Comments: on RW for short trips but main transport is power chair, uses LLE prosthesis regardless ADLs Comments: daughter and aide (5 days x8 hrs/day) assist with ADLs/IADLs, uses transportation services for getting to dr appts     Hand Dominance   Dominant Hand: Right    Extremity/Trunk Assessment   Upper Extremity Assessment Upper Extremity Assessment: Overall WFL for tasks assessed    Lower Extremity Assessment Lower Extremity Assessment: RLE deficits/detail;LLE deficits/detail RLE: Unable to fully assess due to pain RLE Coordination: decreased gross motor LLE Deficits / Details: L AKA LLE Coordination: decreased gross motor    Cervical / Trunk Assessment Cervical / Trunk Assessment: Other exceptions Cervical / Trunk Exceptions: body habitus and core strength  Communication   Communication: No difficulties  Cognition Arousal/Alertness: Awake/alert Behavior During Therapy: WFL for tasks assessed/performed, Flat affect Overall Cognitive Status: No family/caregiver present to determine baseline cognitive functioning                                 General Comments: lethargic then awake but at times takes a long pause to answer questions        General Comments General comments (skin  integrity, edema, etc.): pt has difficulty with alertness at times, on pain meds for RLE knee sensitivity.  Talked with her about treating the mechanical issues but there is signficiant edema as well    Exercises     Assessment/Plan    PT Assessment Patient needs continued PT services  PT Problem List Decreased strength;Decreased range of motion;Decreased activity tolerance;Decreased balance;Decreased mobility;Pain;Decreased skin integrity;Obesity       PT Treatment Interventions DME instruction;Functional mobility training;Therapeutic activities;Therapeutic exercise;Balance training;Neuromuscular re-education;Patient/family education    PT Goals (Current goals can be found in the Care Plan section)  Acute Rehab PT Goals Patient Stated Goal: to return home and independent with prior functional level PT Goal Formulation: With patient Time For Goal Achievement: 06/02/22 Potential to Achieve Goals: Good    Frequency Min 3X/week     Co-evaluation               AM-PAC PT "6 Clicks" Mobility  Outcome Measure Help needed turning from your back to your side while in a flat bed without using bedrails?: A Little Help needed moving from lying on your back to sitting on the side of a flat bed without using bedrails?: A Little Help needed moving to and from a bed to a chair (including a wheelchair)?: Total Help needed standing up from a chair using your arms (e.g., wheelchair or bedside chair)?: Total Help needed to walk in hospital room?: Total Help needed climbing  3-5 steps with a railing? : Total 6 Click Score: 10    End of Session   Activity Tolerance: Patient limited by pain Patient left: in bed;with call bell/phone within reach;with bed alarm set;with nursing/sitter in room Nurse Communication: Mobility status PT Visit Diagnosis: Muscle weakness (generalized) (M62.81);Pain Pain - Right/Left: Right Pain - part of body: Knee    Time: 7473-4037 PT Time Calculation (min)  (ACUTE ONLY): 35 min   Charges:   PT Evaluation $PT Eval Moderate Complexity: 1 Mod PT Treatments $Therapeutic Activity: 8-22 mins       Ramond Dial 05/19/2022, 1:06 PM  Mee Hives, PT PhD Acute Rehab Dept. Number: [place] [facility] [place]

## 2022-05-19 NOTE — Inpatient Diabetes Management (Signed)
Inpatient Diabetes Program Recommendations  AACE/ADA: New Consensus Statement on Inpatient Glycemic Control (2015)  Target Ranges:  Prepandial:   less than 140 mg/dL      Peak postprandial:   less than 180 mg/dL (1-2 hours)      Critically ill patients:  140 - 180 mg/dL   Lab Results  Component Value Date   GLUCAP 256 (H) 05/19/2022   HGBA1C 14.9 (H) 05/16/2022    Review of Glycemic Control  Latest Reference Range & Units 05/17/22 06:21 05/17/22 21:07 05/18/22 08:08 05/18/22 11:36 05/18/22 16:37 05/18/22 20:11 05/19/22 07:51  Glucose-Capillary 70 - 99 mg/dL 335 (H) 354 (H) 341 (H) 395 (H) 252 (H) 281 (H) 256 (H)  (H): Data is abnormally high  Diabetes history: DM2   Outpatient Diabetes medications: Tresiba 30 units TID, Novolog 30 units TID, Trulicity 3 mg weekly + Omnipod insulin pump for additional boluses, Dexcom CGM   Current orders for Inpatient glycemic control: Semglee 40 units BID, Novolog 0-20 units TID, 0-5 units hs, Novolog 20 units tid meal coverage  Inpatient Diabetes Program Recommendations:   -Increase Semglee 45 units bid -Increase Novolog meal coverage to 25 units tid if eats 50%  Thank you, Bethena Roys E. Leimomi Zervas, RN, MSN, CDE  Diabetes Coordinator Inpatient Glycemic Control Team Team Pager [phone] (8am-5pm) 05/19/2022 9:15 AM

## 2022-05-19 NOTE — Plan of Care (Signed)
  Problem: Education: Goal: Knowledge of General Education information will improve Description Including pain rating scale, medication(s)/side effects and non-pharmacologic comfort measures Outcome: Progressing   Problem: Nutrition: Goal: Adequate nutrition will be maintained Outcome: Progressing   Problem: Pain Managment: Goal: General experience of comfort will improve Outcome: Progressing   

## 2022-05-19 NOTE — Progress Notes (Signed)
Placed patient on biap for the night with IPAP set at 10cm and EPAP set at 5cm and oxygen set at 2lpm.

## 2022-05-19 NOTE — Progress Notes (Signed)
FPTS Brief Progress Note  S: sleeping   O: BP 136/81 (BP Location: Left Arm)   Pulse 86   Temp 98 F (36.7 C)   Resp 18   Ht '5\' 2"'$  (1.575 m)   Wt (!) 158.2 kg   SpO2 96%   BMI 63.79 kg/m     A/P: - Orders reviewed. Labs for AM ordered, which was adjusted as needed.   Gladys Damme, MD 05/19/2022, 1:29 AM PGY-3, [facility] Night Resident  Please page [phone] with questions.

## 2022-05-20 DIAGNOSIS — L97519 Non-pressure chronic ulcer of other part of right foot with unspecified severity: Secondary | ICD-10-CM

## 2022-05-20 DIAGNOSIS — M86171 Other acute osteomyelitis, right ankle and foot: Secondary | ICD-10-CM | POA: Diagnosis not present

## 2022-05-20 DIAGNOSIS — Z794 Long term (current) use of insulin: Secondary | ICD-10-CM

## 2022-05-20 DIAGNOSIS — M86671 Other chronic osteomyelitis, right ankle and foot: Secondary | ICD-10-CM

## 2022-05-20 DIAGNOSIS — N179 Acute kidney failure, unspecified: Secondary | ICD-10-CM | POA: Diagnosis not present

## 2022-05-20 DIAGNOSIS — E1100 Type 2 diabetes mellitus with hyperosmolarity without nonketotic hyperglycemic-hyperosmolar coma (NKHHC): Secondary | ICD-10-CM | POA: Diagnosis not present

## 2022-05-20 DIAGNOSIS — L03115 Cellulitis of right lower limb: Secondary | ICD-10-CM | POA: Diagnosis not present

## 2022-05-20 DIAGNOSIS — E11621 Type 2 diabetes mellitus with foot ulcer: Principal | ICD-10-CM

## 2022-05-20 DIAGNOSIS — E119 Type 2 diabetes mellitus without complications: Secondary | ICD-10-CM

## 2022-05-20 LAB — BASIC METABOLIC PANEL
Anion gap: 7 (ref 5–15)
BUN: 61 mg/dL — ABNORMAL HIGH (ref 6–20)
CO2: 23 mmol/L (ref 22–32)
Calcium: 8.6 mg/dL — ABNORMAL LOW (ref 8.9–10.3)
Chloride: 103 mmol/L (ref 98–111)
Creatinine, Ser: 1.76 mg/dL — ABNORMAL HIGH (ref 0.44–1.00)
GFR, Estimated: 36 mL/min — ABNORMAL LOW (ref >60)
Glucose, Bld: 185 mg/dL — ABNORMAL HIGH (ref 70–99)
Potassium: 4.3 mmol/L (ref 3.5–5.1)
Sodium: 133 mmol/L — ABNORMAL LOW (ref 135–145)

## 2022-05-20 LAB — GLUCOSE, CAPILLARY
Glucose-Capillary: 158 mg/dL — ABNORMAL HIGH (ref 70–99)
Glucose-Capillary: 207 mg/dL — ABNORMAL HIGH (ref 70–99)
Glucose-Capillary: 206 mg/dL — ABNORMAL HIGH (ref 70–99)
Glucose-Capillary: 235 mg/dL — ABNORMAL HIGH (ref 70–99)

## 2022-05-20 MED ORDER — SODIUM CHLORIDE 0.9 % IV SOLN
2.0000 g | Freq: Three times a day (TID) | INTRAVENOUS | Status: DC
  Administered 2022-05-20 – 2022-05-22 (×6): 2 g via INTRAVENOUS
  Filled 2022-05-20 (×6): qty 12.5

## 2022-05-20 MED ORDER — INSULIN GLARGINE-YFGN 100 UNIT/ML ~~LOC~~ SOLN
50.0000 [IU] | Freq: Two times a day (BID) | SUBCUTANEOUS | Status: DC
Start: 2022-05-20 — End: 2022-05-25
  Administered 2022-05-20 – 2022-05-25 (×10): 50 [IU] via SUBCUTANEOUS
  Filled 2022-05-20 (×12): qty 0.5

## 2022-05-20 NOTE — Progress Notes (Signed)
     Daily Progress Note Intern Pager: [phone]  Patient name: [name] Medical record number: [id number] Date of birth: 1975-12-29 Age: 46 y.o. Gender: female  Primary Care Provider: [name], MD Consultants: Manson Passey, Podiatry, ID Code Status: Full  Pt Overview and Major Events to Date:  6/9 Admitted, started endotool 6/10 off endotool  Assessment and Plan: [name] is a 46 year old female admitted for HHS   Pertinent PMH/PSH includes T2DM, COPD, LLE AKA, CKD Stage III, Bipolar disorder, OSA on BiPAP.   * Type 2 diabetes mellitus with hyperosmolar hyperglycemic state (HHS) ([place]) Patient's CBG 185-314. CBG this morning was 185.  Patient got 124 SAI and 85 basal. Endorses good PO intake, will dc fluid. -Increase to 50u Semglee BID -Continue meal time to 30u Novolg TID  -switched to resistant sliding scale insulin -Placed on carb modified diet -Continue CBG monitoring  Osteomyelitis of foot, right, acute ([place]) Patient with nonhealing right foot diabetic ulcer.  Found to have osteomyelitis of the right foot. Will consult podiatry after patient declined ortho -Consult Podiatry -ID following, appreciate recs - Continue vancomycin, cefipime, Flagyl  -PO Percocet 5-325 Q6H PRN -PO Tylenol 650 Q6H PRN -Vitals per floor routine     AKI (acute kidney injury) ([place]) Currently and this morning improved to 2.15 from 2.57 yesterday.   -Continue to monitor kidney function with daily BMP labs. -Avoid nephrotoxic agents  History of Candida glabrata infection, right foot stump infection Continue IV Micafungin150 mg daily  Diastolic HF (heart failure) ([name]) Euvolimic. Weight today is 162.3kg. -Will continue to hold home torsemide -Monitor daily weights -Monitor intake and output.  Obstructive sleep apnea (adult) (pediatric) On home BiPAP nightly -continue nightly BiPAP       FEN/GI: Carb Modified PPx: Levonox Dispo:CIR pending clinical improvement .    Subjective:  [name] said she is doing well this morning. No complaint  Objective: Temp:  [97.9 F (36.6 C)-98.2 F (36.8 C)] 98.2 F (36.8 C) (06/15 1935) Pulse Rate:  [85-113] 110 (06/15 1935) Resp:  [16-22] 16 (06/15 1315) BP: (117-154)/(72-79) 154/78 (06/15 1935) SpO2:  [96 %-99 %] 96 % (06/15 1935) Weight:  [162.3 kg] 162.3 kg (06/15 0425) Physical Exam: General: Sleepy well appearing, NAD CV: RRR, no murmurs, normal S1/S2 Pulm: CTAB, good WOB on RA, no crackles or wheezing Abd: Soft, no distension, no tenderness Skin: dry, warm Ext: LLE AKA, right foot dressed in dry and clean guaze  Laboratory: Most recent CBC Lab Results  Component Value Date   WBC 8.3 05/17/2022   HGB 10.1 (L) 05/17/2022   HCT 31.5 (L) 05/17/2022   MCV 91.3 05/17/2022   PLT 274 05/17/2022   Most recent BMP    Latest Ref Rng & Units 05/20/2022    4:05 AM  BMP  Glucose 70 - 99 mg/dL 185   BUN 6 - 20 mg/dL 61   Creatinine 0.44 - 1.00 mg/dL 1.76   Sodium 135 - 145 mmol/L 133   Potassium 3.5 - 5.1 mmol/L 4.3   Chloride 98 - 111 mmol/L 103   CO2 22 - 32 mmol/L 23   Calcium 8.9 - 10.3 mg/dL 8.6      Imaging/Diagnostic Tests: No new image   Alen Bleacher, MD 05/20/2022, 7:54 PM  PGY-1, [place] Intern pager: [phone], text pages welcome Secure chat group [place]

## 2022-05-20 NOTE — Plan of Care (Signed)
  Problem: Pain Managment: Goal: General experience of comfort will improve Outcome: Progressing   Problem: Skin Integrity: Goal: Risk for impaired skin integrity will decrease Outcome: Progressing   Problem: Coping: Goal: Ability to adjust to condition or change in health will improve Outcome: Progressing   Problem: Skin Integrity: Goal: Risk for impaired skin integrity will decrease Outcome: Progressing   Problem: Health Behavior/Discharge Planning: Goal: Ability to manage health-related needs will improve Outcome: Not Progressing

## 2022-05-20 NOTE — Progress Notes (Signed)
PHARMACY NOTE:  ANTIMICROBIAL RENAL DOSAGE ADJUSTMENT  Current antimicrobial regimen includes a mismatch between antimicrobial dosage and estimated renal function.  As per policy approved by the Pharmacy & Therapeutics and Medical Executive Committees, the antimicrobial dosage will be adjusted accordingly.  Current antimicrobial dosage: Cefepime 2 g q12h  Indication: Osteomyelitis  Renal Function:  Estimated Creatinine Clearance: 61.2 mL/min (A) (by C-G formula based on SCr of 1.76 mg/dL (H)).    Antimicrobial dosage has been changed to:  Cefepime 2 g q8h  Thank you for allowing pharmacy to be a part of this patient's care.  Freddie Breech, Student-PharmD 05/20/2022 8:09 AM

## 2022-05-20 NOTE — Progress Notes (Signed)
Physical Therapy Treatment Patient Details Name: [name] MRN: [id number] DOB: March 25, 1976 Today's Date: 05/20/2022   History of Present Illness 46 y.o. female who presented  05/14/22 with hypergylcemic event with CBG over 600 for the last 3 days, also endorses chest pain. +cellulitis rt foot with nonhealing diabetic foot ulcer. MRI showing Increased soft tissue ulceration medially in the forefoot abutting the 1st metatarsal remnant; suspicious for osteomyelitis. 6/15 RLE NWB per podiatry, plan for OR 6/16. PMH: type 2 diabetes, morbid obesity, obesity hypoventilation syndrome, OSA wears BiPAP at night, hypertension, asthma, GERD, hyperlipidemia, restless leg syndrome, left BKA, R transmetatarsal amputation, mood disorder, hypertension, history of chest pain    PT Comments    Pt received seated EOB, c/o R knee and foot pain (worse at R knee) and agreeable to therapy session with encouragement. Pt noted to have damp linens under her and needing assist for hygiene, pt able to perform trunk lean to sidelying with minA and totalA for posterior peri-care, pt able to perform elbow tap leans with min guard to minA. Emphasis on supine/seated LE/UE exercises for strengthening and likely progression of mobility once pt LLE prosthetic brought to hospital room and pending post-op MD plan. Pt to need re-eval and goal update next session by supervising PT due to MD note indicating pt will be NWB on RLE post-op. Pt reports 5/10 modified RPE (fatigue) at end of session. Pt continues to benefit from PT services to progress toward functional mobility goals.    Recommendations for follow up therapy are one component of a multi-disciplinary discharge planning process, led by the attending physician.  Recommendations may be updated based on patient status, additional functional criteria and insurance authorization.  Follow Up Recommendations  Acute inpatient rehab (3hours/day)     Assistance Recommended at Discharge  Intermittent Supervision/Assistance  Patient can return home with the following A lot of help with walking and/or transfers;A lot of help with bathing/dressing/bathroom;Assistance with cooking/housework;Assist for transportation;Help with stairs or ramp for entrance   Equipment Recommendations  None recommended by PT (continue to assess pending WB orders/post-op)    Recommendations for Other Services Rehab consult     Precautions / Restrictions Precautions Precautions: Fall Precaution Comments: monitor HR/SpO2, DOE Restrictions Weight Bearing Restrictions: No Other Position/Activity Restrictions: does not have LLE prosthetic in room (family should be bringing it 6/16)     Mobility  Bed Mobility Overal bed mobility: Needs Assistance             General bed mobility comments: received seated EOB    Transfers Overall transfer level: Needs assistance Equipment used:  (transfer bed pad) Transfers: Bed to chair/wheelchair/BSC            Lateral/Scoot Transfers: Min assist General transfer comment: patient declined getting in chair without LLE prosthesis. Performed lateral scooting in bed with decreased trunk control but good effort to rotate hips and using BUE; pt not using RLE due to pain    Ambulation/Gait               General Gait Details: unable to attempt due to LLE being without prosthesis   Stairs             Wheelchair Mobility    Modified Rankin (Stroke Patients Only)       Balance Overall balance assessment: Needs assistance Sitting-balance support: Bilateral upper extremity supported Sitting balance-Leahy Scale: Fair Sitting balance - Comments: sitting on EOB upon entry, pt able to perform lateral leans with elbow taps to each  side without assist but needs assist with scooting to raise trunk back up       Standing balance comment: defer, no L prosthetic, RLE painful (per podiatry note placed while PTA working with her, now NWB)                             Cognition Arousal/Alertness: Awake/alert Behavior During Therapy: WFL for tasks assessed/performed, Flat affect Overall Cognitive Status: No family/caregiver present to determine baseline cognitive functioning                                 General Comments: seated on EOB, pt reports prosthetic not present, family plans to bring it tomorrow. Pt pleasantly cooperative.        Exercises Other Exercises Other Exercises: seated BLE AROM: knee extension (flexion also within pain tolerance), hip flexion, glute sets/quad sets (review QS for once pt in supine), hip AB/ADduction x10 reps ea Other Exercises: seated lateral leans with elbow taps x10 reps Other Exercises: reviewed chair push-up technique for pt to perform once LLE prosthetic available, will need to review NWB RLE next session as MD note not in prior to session, pt did not perform but did verbal/visual review    General Comments General comments (skin integrity, edema, etc.): HR 106-135 bpm with exertion (seated lateral scooting); SpO2 98% on RA      Pertinent Vitals/Pain Pain Assessment Pain Assessment: Faces Faces Pain Scale: Hurts whole lot Pain Location: 6/10 prior to session, up to 8/10 post-exercises in R knee/foot Pain Descriptors / Indicators: Grimacing, Stabbing, Discomfort Pain Intervention(s): Limited activity within patient's tolerance, Monitored during session, Repositioned, Patient requesting pain meds-RN notified           PT Goals (current goals can now be found in the care plan section) Acute Rehab PT Goals Patient Stated Goal: to return home and independent with prior functional level PT Goal Formulation: With patient Time For Goal Achievement: 06/02/22 Progress towards PT goals: Progressing toward goals    Frequency    Min 3X/week      PT Plan Current plan remains appropriate       AM-PAC PT "6 Clicks" Mobility   Outcome Measure  Help needed  turning from your back to your side while in a flat bed without using bedrails?: A Little Help needed moving from lying on your back to sitting on the side of a flat bed without using bedrails?: A Little Help needed moving to and from a bed to a chair (including a wheelchair)?: A Lot (anticipated given RLE now NWB) Help needed standing up from a chair using your arms (e.g., wheelchair or bedside chair)?: Total Help needed to walk in hospital room?: Total Help needed climbing 3-5 steps with a railing? : Total 6 Click Score: 11    End of Session   Activity Tolerance: Patient tolerated treatment well Patient left: in bed;with call bell/phone within reach Nurse Communication: Mobility status;Other (comment);Weight bearing status (new podiatry note placed during OT/PT sessions indicates pt now NWB on RLE) PT Visit Diagnosis: Muscle weakness (generalized) (M62.81);Pain Pain - Right/Left: Right Pain - part of body: Knee     Time: 1346-1416 PT Time Calculation (min) (ACUTE ONLY): 30 min  Charges:  $Therapeutic Exercise: 8-22 mins $Therapeutic Activity: 8-22 mins  Houston Siren., PTA Acute Rehabilitation Services Secure Chat Preferred 9a-5:30pm Office: [place] 05/20/2022, 3:01 PM

## 2022-05-20 NOTE — Consult Note (Signed)
  Subjective:  Patient ID: [name], female    DOB: August 27, 1976,  MRN: [id number]  A 46 y.o. 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  Patient is a diabetic with last A1c of 14%.  She states this wound has been present for quite some time she wanted to get it improved.  She states she is working on her sugars.  Occasional pain.  She is having a lot of neuropathic pain.  She denies any other acute complaints.  She had the transmetatarsal done at [facility] by Dr. Lorraine Lax. Objective:   Vitals:   05/20/22 0912 05/20/22 1315  BP: (!) 143/76 117/79  Pulse: 86 (!) 113  Resp: 18 16  Temp: 97.9 F (36.6 C) 98.1 F (36.7 C)  SpO2: 99% 97%   General AA&O x3. Normal mood and affect.  Vascular Dorsalis pedis and posterior tibial pulses right side only.  AK on the left side  Neurologic Epicritic sensation grossly intact.  Dermatologic Right transmetatarsal stump wound medial side probing down to bone no purulent drainage noted fibrotic tissue noted.  No granular wound bed noted.  No malodor present.  Redness noted up to the proximal foot.  Orthopedic: MMT 5/5 in dorsiflexion, plantarflexion, inversion, and eversion. Normal joint ROM without pain or crepitus.   IMPRESSION: 1. Increased soft tissue ulceration medially in the forefoot abutting the 1st metatarsal remnant. New bone marrow edema and enhancement within the remaining 1st metatarsal suspicious for recurrent osteomyelitis. 2. The additional metatarsal bases and bones of the midfoot and hindfoot appear unchanged. 3. No evidence of soft tissue abscess. Assessment & Plan:  Patient was evaluated and treated and all questions answered.  Right foot transmetatarsal amputation  stump wound -All questions and concerns were discussed with the patient in extensive detail -I will plan on taking her back to the operating room for revision of the first metatarsal base likely excision with excisional debridement of the wound.  I will attempt to do a primary closure if possible.  Otherwise patient will attempt to heal with secondary intention. -Local wound care with Betadine wet-to-dry dressing -Nonweightbearing to the right foot -Patient is a high risk of undergoing below the knee amputation of the right side given the nature of her sugars.  She is a high risk because her A1c is 14%.  I discussed with patient she states understanding. -ABIs PVRs appears to be within normal limits to help heal the amputation site. -In the future I may consider doing Achilles tendon lengthening. -N.p.o. after midnight [name], DPM  Accessible via secure chat for questions or concerns.

## 2022-05-20 NOTE — Progress Notes (Signed)
Occupational Therapy Treatment Patient Details Name: [name] MRN: [id number] DOB: 08-Jul-1976 Today's Date: 05/20/2022   History of present illness 46 y.o. female who presented  05/14/22 with hypergylcemic event with CBG over 600 for the last 3 days, also endorses chest pain. +cellulitis rt foot with nonhealing diabetic foot ulcer. MRI showing Increased soft tissue ulceration medially in the forefoot abutting the 1st metatarsal remnant; suspicious for osteomyelitis. PMH: type 2 diabetes, morbid obesity, obesity hypoventilation syndrome, OSA wears BiPAP at night, hypertension, asthma, GERD, hyperlipidemia, restless leg syndrome, left BKA, R transmetatarsal amputation, mood disorder, hypertension, history of chest pain   OT comments  Patient received sitting on EOB and declining attempting to get OOB into recliner. Patient states she wants to wait till her LLE prosthesis arrives. Patient performed lateral scooting while in EOB and was able to perform with supervision but demonstrated unsafe balance. Patient lead in BUE strengthening exercises with therapy band to increase UE strength to assist with transfers. Acute OT to continue to follow.    Recommendations for follow up therapy are one component of a multi-disciplinary discharge planning process, led by the attending physician.  Recommendations may be updated based on patient status, additional functional criteria and insurance authorization.    Follow Up Recommendations  Home health OT    Assistance Recommended at Discharge Frequent or constant Supervision/Assistance  Patient can return home with the following  A lot of help with walking and/or transfers;A lot of help with bathing/dressing/bathroom;Assistance with cooking/housework;Direct supervision/assist for medications management;Direct supervision/assist for financial management;Assist for transportation;Help with stairs or ramp for entrance   Equipment Recommendations  None recommended  by OT;Other (comment)    Recommendations for Other Services      Precautions / Restrictions Precautions Precautions: Fall Precaution Comments: monitor vitals Restrictions Weight Bearing Restrictions: No Other Position/Activity Restrictions: does not have LLE prosthetic in room       Mobility Bed Mobility Overal bed mobility: Needs Assistance             General bed mobility comments: seated on EOB. Performed lateral scooting on EOB with supervision    Transfers Overall transfer level: Needs assistance Equipment used: 1 person hand held assist               General transfer comment: patient declined getting in chair without LLE prosthesis. Performed lateral scooting in bed with unsafe balance when attempting     Balance Overall balance assessment: Needs assistance Sitting-balance support: Bilateral upper extremity supported Sitting balance-Leahy Scale: Fair Sitting balance - Comments: sitting on EOB upon entry, unsteady balance when attempting scooting in bed                                   ADL either performed or assessed with clinical judgement   ADL                                              Extremity/Trunk Assessment              Vision       Perception     Praxis      Cognition Arousal/Alertness: Awake/alert Behavior During Therapy: WFL for tasks assessed/performed, Flat affect Overall Cognitive Status: No family/caregiver present to determine baseline cognitive functioning  General Comments: seated on EOB and did not want to attempt transfer with LLE prosthesis        Exercises Exercises: General Upper Extremity General Exercises - Upper Extremity Shoulder Flexion: Strengthening, Both, 10 reps, Seated, Theraband Theraband Level (Shoulder Flexion): Level 1 (Yellow) Shoulder ABduction: Strengthening, Both, 10 reps, Seated, Theraband Theraband Level  (Shoulder Abduction): Level 1 (Yellow) Shoulder ADduction: Strengthening, Both, 10 reps, Seated, Theraband Theraband Level (Shoulder Adduction): Level 1 (Yellow)    Shoulder Instructions       General Comments      Pertinent Vitals/ Pain       Pain Assessment Pain Assessment: Faces Faces Pain Scale: Hurts even more Pain Location: RLE Pain Descriptors / Indicators: Grimacing, Stabbing Pain Intervention(s): Limited activity within patient's tolerance, Monitored during session  Home Living                                          Prior Functioning/Environment              Frequency  Min 3X/week        Progress Toward Goals  OT Goals(current goals can now be found in the care plan section)  Progress towards OT goals: Progressing toward goals  Acute Rehab OT Goals Patient Stated Goal: get better OT Goal Formulation: With patient Time For Goal Achievement: 05/31/22 Potential to Achieve Goals: Fair ADL Goals Pt Will Perform Grooming: Independently;bed level Pt Will Perform Upper Body Bathing: with supervision;sitting;bed level Pt Will Transfer to Toilet: bedside commode;with supervision Additional ADL Goal #1: pt will complete bed mobility with mod I in prep for ADLs  Plan Discharge plan remains appropriate    Co-evaluation                 AM-PAC OT "6 Clicks" Daily Activity     Outcome Measure   Help from another person eating meals?: None Help from another person taking care of personal grooming?: A Little Help from another person toileting, which includes using toliet, bedpan, or urinal?: A Lot Help from another person bathing (including washing, rinsing, drying)?: A Lot Help from another person to put on and taking off regular upper body clothing?: A Lot Help from another person to put on and taking off regular lower body clothing?: A Lot 6 Click Score: 15    End of Session    OT Visit Diagnosis: Unsteadiness on feet  (R26.81);Other abnormalities of gait and mobility (R26.89);Muscle weakness (generalized) (M62.81)   Activity Tolerance Patient tolerated treatment well   Patient Left in bed;with call bell/phone within reach   Nurse Communication Mobility status;Other (comment) (declining getting OOB)        Time: 1914-7829 OT Time Calculation (min): 26 min  Charges: OT General Charges $OT Visit: 1 Visit OT Treatments $Therapeutic Activity: 8-22 mins $Therapeutic Exercise: 8-22 mins  Lodema Hong, OTA Acute Rehabilitation Services  Pager [phone] Office [phone]   Trixie Dredge 05/20/2022, 2:43 PM

## 2022-05-20 NOTE — Progress Notes (Signed)
FPTS Brief Progress Note  S: sleeping   O: BP 134/72   Pulse 85   Temp 98.2 F (36.8 C) (Oral)   Resp 18   Ht '5\' 2"'$  (1.575 m)   Wt (!) 158.2 kg   SpO2 96%   BMI 63.79 kg/m     A/P: - Orders reviewed. Labs for AM ordered, which was adjusted as needed.    Gladys Damme, MD 05/20/2022, 3:38 AM PGY-3, [facility] Night Resident  Please page [phone] with questions.

## 2022-05-20 NOTE — Progress Notes (Signed)
  Inpatient Rehabilitation Admissions Coordinator   Met with patient  bedside for rehab assessment. We discussed goals and expectations of a possible [facility] admit. She was previously at [facility] 2018 for 10 days. Noted we await further surgical intervention. She prefers [facility] for rehab if needed. PTA she ambulated short distances with LLE prosthesis and used power wheelchair for further distances. Has daughter and aide services in the home . Family can provide expected caregiver support that is recommended of supervision to min assist level. Please call me with any questions.   Danne Baxter, RN, MSN Rehab Admissions Coordinator [phone]

## 2022-05-21 ENCOUNTER — Encounter (HOSPITAL_COMMUNITY): Payer: Self-pay | Admitting: Family Medicine

## 2022-05-21 ENCOUNTER — Inpatient Hospital Stay (HOSPITAL_COMMUNITY): Payer: Medicaid Other

## 2022-05-21 ENCOUNTER — Other Ambulatory Visit (HOSPITAL_COMMUNITY): Payer: Self-pay

## 2022-05-21 ENCOUNTER — Other Ambulatory Visit: Payer: Self-pay

## 2022-05-21 ENCOUNTER — Inpatient Hospital Stay (HOSPITAL_COMMUNITY): Payer: Medicaid Other | Admitting: Anesthesiology

## 2022-05-21 ENCOUNTER — Encounter (HOSPITAL_COMMUNITY)
Admission: EM | Disposition: A | Discharge status: Rehabilitation Facility | Payer: Self-pay | Source: Home / Self Care | Attending: Family Medicine

## 2022-05-21 DIAGNOSIS — Z794 Long term (current) use of insulin: Secondary | ICD-10-CM | POA: Diagnosis not present

## 2022-05-21 DIAGNOSIS — M869 Osteomyelitis, unspecified: Secondary | ICD-10-CM | POA: Diagnosis not present

## 2022-05-21 DIAGNOSIS — L089 Local infection of the skin and subcutaneous tissue, unspecified: Secondary | ICD-10-CM | POA: Diagnosis not present

## 2022-05-21 DIAGNOSIS — M86671 Other chronic osteomyelitis, right ankle and foot: Secondary | ICD-10-CM

## 2022-05-21 DIAGNOSIS — E1169 Type 2 diabetes mellitus with other specified complication: Secondary | ICD-10-CM

## 2022-05-21 DIAGNOSIS — N179 Acute kidney failure, unspecified: Secondary | ICD-10-CM | POA: Diagnosis not present

## 2022-05-21 DIAGNOSIS — E1100 Type 2 diabetes mellitus with hyperosmolarity without nonketotic hyperglycemic-hyperosmolar coma (NKHHC): Secondary | ICD-10-CM | POA: Diagnosis not present

## 2022-05-21 DIAGNOSIS — M86171 Other acute osteomyelitis, right ankle and foot: Secondary | ICD-10-CM | POA: Diagnosis not present

## 2022-05-21 HISTORY — PX: AMPUTATION: SHX166

## 2022-05-21 LAB — GLUCOSE, CAPILLARY
Glucose-Capillary: 178 mg/dL — ABNORMAL HIGH (ref 70–99)
Glucose-Capillary: 155 mg/dL — ABNORMAL HIGH (ref 70–99)
Glucose-Capillary: 154 mg/dL — ABNORMAL HIGH (ref 70–99)
Glucose-Capillary: 143 mg/dL — ABNORMAL HIGH (ref 70–99)
Glucose-Capillary: 137 mg/dL — ABNORMAL HIGH (ref 70–99)

## 2022-05-21 LAB — BASIC METABOLIC PANEL
Anion gap: 7 (ref 5–15)
BUN: 56 mg/dL — ABNORMAL HIGH (ref 6–20)
CO2: 24 mmol/L (ref 22–32)
Calcium: 8.6 mg/dL — ABNORMAL LOW (ref 8.9–10.3)
Chloride: 106 mmol/L (ref 98–111)
Creatinine, Ser: 1.76 mg/dL — ABNORMAL HIGH (ref 0.44–1.00)
GFR, Estimated: 36 mL/min — ABNORMAL LOW (ref >60)
Glucose, Bld: 189 mg/dL — ABNORMAL HIGH (ref 70–99)
Potassium: 4.7 mmol/L (ref 3.5–5.1)
Sodium: 137 mmol/L (ref 135–145)

## 2022-05-21 LAB — SURGICAL PCR SCREEN
MRSA, PCR: NEGATIVE
Staphylococcus aureus: NEGATIVE

## 2022-05-21 SURGERY — AMPUTATION, FOOT, RAY
Anesthesia: Monitor Anesthesia Care | Laterality: Right

## 2022-05-21 MED ORDER — FENTANYL CITRATE (PF) 250 MCG/5ML IJ SOLN
INTRAMUSCULAR | Status: AC
  Filled 2022-05-21: qty 5

## 2022-05-21 MED ORDER — LIDOCAINE HCL (PF) 1 % IJ SOLN
INTRAMUSCULAR | Status: AC
  Filled 2022-05-21: qty 30

## 2022-05-21 MED ORDER — DEXMEDETOMIDINE (PRECEDEX) IN NS 20 MCG/5ML (4 MCG/ML) IV SYRINGE
PREFILLED_SYRINGE | INTRAVENOUS | Status: DC | PRN
  Administered 2022-05-21: 4 ug via INTRAVENOUS
  Administered 2022-05-21: 8 ug via INTRAVENOUS

## 2022-05-21 MED ORDER — 0.9 % SODIUM CHLORIDE (POUR BTL) OPTIME
TOPICAL | Status: DC | PRN
  Administered 2022-05-21: 1000 mL

## 2022-05-21 MED ORDER — AMISULPRIDE (ANTIEMETIC) 5 MG/2ML IV SOLN: 10.0000 mg | Freq: Once | INTRAVENOUS | Status: DC | PRN

## 2022-05-21 MED ORDER — OXYCODONE HCL 5 MG PO TABS: 5.0000 mg | ORAL_TABLET | Freq: Once | ORAL | Status: DC | PRN

## 2022-05-21 MED ORDER — PROPOFOL 10 MG/ML IV BOLUS
INTRAVENOUS | Status: AC
  Filled 2022-05-21: qty 20

## 2022-05-21 MED ORDER — CHLORHEXIDINE GLUCONATE 0.12 % MT SOLN: 15.0000 mL | Freq: Once | OROMUCOSAL | Status: AC

## 2022-05-21 MED ORDER — LACTATED RINGERS IV SOLN: INTRAVENOUS | Status: DC

## 2022-05-21 MED ORDER — LIDOCAINE 2% (20 MG/ML) 5 ML SYRINGE
INTRAMUSCULAR | Status: AC
  Filled 2022-05-21: qty 5

## 2022-05-21 MED ORDER — HYDROMORPHONE HCL 1 MG/ML IJ SOLN
INTRAMUSCULAR | Status: AC
  Filled 2022-05-21: qty 1

## 2022-05-21 MED ORDER — ONDANSETRON HCL 4 MG/2ML IJ SOLN
INTRAMUSCULAR | Status: DC | PRN
  Administered 2022-05-21: 4 mg via INTRAVENOUS

## 2022-05-21 MED ORDER — ACETAMINOPHEN 500 MG PO TABS
1000.0000 mg | ORAL_TABLET | Freq: Once | ORAL | Status: AC
  Administered 2022-05-21: 1000 mg via ORAL
  Filled 2022-05-21: qty 2

## 2022-05-21 MED ORDER — SUCCINYLCHOLINE CHLORIDE 200 MG/10ML IV SOSY
PREFILLED_SYRINGE | INTRAVENOUS | Status: AC
  Filled 2022-05-21: qty 10

## 2022-05-21 MED ORDER — ONDANSETRON HCL 4 MG/2ML IJ SOLN: 4.0000 mg | Freq: Once | INTRAMUSCULAR | Status: DC | PRN

## 2022-05-21 MED ORDER — LIDOCAINE-EPINEPHRINE 1 %-1:100000 IJ SOLN
INTRAMUSCULAR | Status: AC
  Filled 2022-05-21: qty 1

## 2022-05-21 MED ORDER — LIDOCAINE HCL 1 % IJ SOLN
INTRAMUSCULAR | Status: DC | PRN
  Administered 2022-05-21: 30 mL

## 2022-05-21 MED ORDER — OXYCODONE HCL 5 MG/5ML PO SOLN: 5.0000 mg | Freq: Once | ORAL | Status: DC | PRN

## 2022-05-21 MED ORDER — CHLORHEXIDINE GLUCONATE 0.12 % MT SOLN
OROMUCOSAL | Status: AC
  Administered 2022-05-21: 15 mL via OROMUCOSAL
  Filled 2022-05-21: qty 15

## 2022-05-21 MED ORDER — FENTANYL CITRATE (PF) 250 MCG/5ML IJ SOLN
INTRAMUSCULAR | Status: DC | PRN
  Administered 2022-05-21 (×3): 25 ug via INTRAVENOUS

## 2022-05-21 MED ORDER — ORAL CARE MOUTH RINSE: 15.0000 mL | Freq: Once | OROMUCOSAL | Status: AC

## 2022-05-21 MED ORDER — MIDAZOLAM HCL 2 MG/2ML IJ SOLN
INTRAMUSCULAR | Status: AC
  Filled 2022-05-21: qty 2

## 2022-05-21 MED ORDER — SODIUM CHLORIDE 0.9 % IV SOLN: INTRAVENOUS | Status: DC | PRN

## 2022-05-21 MED ORDER — BUPIVACAINE HCL (PF) 0.5 % IJ SOLN
INTRAMUSCULAR | Status: AC
  Filled 2022-05-21: qty 30

## 2022-05-21 MED ORDER — MIDAZOLAM HCL 2 MG/2ML IJ SOLN
INTRAMUSCULAR | Status: DC | PRN
  Administered 2022-05-21: 1 mg via INTRAVENOUS
  Administered 2022-05-21: 2 mg via INTRAVENOUS
  Administered 2022-05-21: 1 mg via INTRAVENOUS

## 2022-05-21 MED ORDER — ONDANSETRON HCL 4 MG/2ML IJ SOLN
INTRAMUSCULAR | Status: AC
  Filled 2022-05-21: qty 2

## 2022-05-21 MED ORDER — INDOCYANINE GREEN 25 MG IV SOLR
INTRAVENOUS | Status: DC | PRN
  Administered 2022-05-21: 7.5 mg via INTRAVENOUS

## 2022-05-21 MED ORDER — HYDROMORPHONE HCL 1 MG/ML IJ SOLN
0.2500 mg | INTRAMUSCULAR | Status: DC | PRN
  Administered 2022-05-21 (×2): 0.5 mg via INTRAVENOUS

## 2022-05-21 MED ORDER — BUPIVACAINE HCL (PF) 0.5 % IJ SOLN
INTRAMUSCULAR | Status: DC | PRN
  Administered 2022-05-21: 30 mL

## 2022-05-21 MED ORDER — DEXAMETHASONE SODIUM PHOSPHATE 10 MG/ML IJ SOLN
INTRAMUSCULAR | Status: AC
  Filled 2022-05-21: qty 1

## 2022-05-21 MED ORDER — PHENYLEPHRINE 80 MCG/ML (10ML) SYRINGE FOR IV PUSH (FOR BLOOD PRESSURE SUPPORT)
PREFILLED_SYRINGE | INTRAVENOUS | Status: DC | PRN
  Administered 2022-05-21: 160 ug via INTRAVENOUS
  Administered 2022-05-21: 80 ug via INTRAVENOUS

## 2022-05-21 MED ORDER — INSULIN ASPART 100 UNIT/ML IJ SOLN
0.0000 [IU] | INTRAMUSCULAR | Status: DC | PRN
  Administered 2022-05-21: 2 [IU] via SUBCUTANEOUS

## 2022-05-21 SURGICAL SUPPLY — 37 items
BAG COUNTER SPONGE SURGICOUNT (BAG) IMPLANT
BAG SPNG CNTER NS LX DISP (BAG)
BNDG CMPR 9X4 STRL LF SNTH (GAUZE/BANDAGES/DRESSINGS)
BNDG COHESIVE 4X5 TAN STRL (GAUZE/BANDAGES/DRESSINGS) ×3 IMPLANT
BNDG ELASTIC 4X5.8 VLCR STR LF (GAUZE/BANDAGES/DRESSINGS) ×3 IMPLANT
BNDG ESMARK 4X9 LF (GAUZE/BANDAGES/DRESSINGS) IMPLANT
BNDG GAUZE DERMACEA FLUFF (GAUZE/BANDAGES/DRESSINGS) ×1
BNDG GAUZE DERMACEA FLUFF 4 (GAUZE/BANDAGES/DRESSINGS) IMPLANT
BNDG GAUZE ELAST 4 BULKY (GAUZE/BANDAGES/DRESSINGS) ×3 IMPLANT
BNDG GZE DERMACEA 4 6PLY (GAUZE/BANDAGES/DRESSINGS) ×1
COVER SURGICAL LIGHT HANDLE (MISCELLANEOUS) ×6 IMPLANT
CUFF TOURN SGL QUICK 18X4 (TOURNIQUET CUFF) ×3 IMPLANT
DRSG PAD ABDOMINAL 8X10 ST (GAUZE/BANDAGES/DRESSINGS) ×3 IMPLANT
ELECT REM PT RETURN 9FT ADLT (ELECTROSURGICAL) ×2
ELECTRODE REM PT RTRN 9FT ADLT (ELECTROSURGICAL) ×2 IMPLANT
GAUZE PAD ABD 7.5X8 STRL (GAUZE/BANDAGES/DRESSINGS) ×1 IMPLANT
GAUZE SPONGE 4X4 12PLY STRL (GAUZE/BANDAGES/DRESSINGS) ×1 IMPLANT
GAUZE XEROFORM 1X8 LF (GAUZE/BANDAGES/DRESSINGS) ×1 IMPLANT
GLOVE BIO SURGEON STRL SZ7 (GLOVE) ×3 IMPLANT
GLOVE BIOGEL PI IND STRL 7.5 (GLOVE) ×2 IMPLANT
GLOVE BIOGEL PI INDICATOR 7.5 (GLOVE) ×1
GOWN STRL REUS W/ TWL LRG LVL3 (GOWN DISPOSABLE) ×4 IMPLANT
GOWN STRL REUS W/TWL LRG LVL3 (GOWN DISPOSABLE) ×4
HANDPIECE INTERPULSE COAX TIP (DISPOSABLE) ×2
IV NS 1000ML (IV SOLUTION) ×2
IV NS 1000ML BAXH (IV SOLUTION) ×2 IMPLANT
KIT BASIN OR (CUSTOM PROCEDURE TRAY) ×3 IMPLANT
KIT TURNOVER KIT B (KITS) ×3 IMPLANT
MANIFOLD NEPTUNE II (INSTRUMENTS) ×3 IMPLANT
NEEDLE 22X1 1/2 (OR ONLY) (NEEDLE) IMPLANT
NS IRRIG 1000ML POUR BTL (IV SOLUTION) ×3 IMPLANT
PACK ORTHO EXTREMITY (CUSTOM PROCEDURE TRAY) ×3 IMPLANT
PACK SPY-PHI (KITS) ×1 IMPLANT
PAD ARMBOARD 7.5X6 YLW CONV (MISCELLANEOUS) ×6 IMPLANT
SET HNDPC FAN SPRY TIP SCT (DISPOSABLE) ×2 IMPLANT
SYR CONTROL 10ML LL (SYRINGE) IMPLANT
TOWEL GREEN STERILE (TOWEL DISPOSABLE) ×3 IMPLANT

## 2022-05-21 NOTE — Op Note (Signed)
Surgeon: Surgeon(s): [name], DPM  Assistants: None Pre-operative diagnosis: Right Foot Infection  Post-operative diagnosis: same Procedure: Procedure(s) (LRB): REVISION AMPUTATION RAY 1ST (Right)  Pathology:  ID Type Source Tests Collected by Time Destination  1 : Osteo check back clean margins Tissue PATH Bone resection SURGICAL PATHOLOGY [name], DPM 05/21/2022 1554     Pertinent Intra-op findings: The bone appear to be hard indurated without signs of osteomyelitis.  The bone was sent to pathology Anesthesia: General  Hemostasis: * No tourniquets in log * EBL: 30 mL  Materials: 3-0 Prolene and skin staples Injectables: 10 cc of 1% lidocaine plain half percent Marcaine plain Complications: None  Indications for surgery: A 46 y.o. female presents with right foot osteomyelitis. Patient has failed all conservative therapy including but not limited to local wound care and IV antibiotics. She wishes to have surgical correction of the foot/deformity. It was determined that patient would benefit from right foot revisional partial first ray amputation with primary closure. Informed surgical risk consent was reviewed and read aloud to the patient.  I reviewed the films.  I have discussed my findings with the patient in great detail.  I have discussed all risks including but not limited to infection, stiffness, scarring, limp, disability, deformity, damage to blood vessels and nerves, numbness, poor healing, need for braces, arthritis, chronic pain, amputation, death.  All benefits and realistic expectations discussed in great detail.  I have made no promises as to the outcome.  I have provided realistic expectations.  I have offered the patient a 2nd opinion, which they have declined and assured me they preferred to proceed despite the risks   Procedure in detail: The patient was both verbally and visually identified by myself, the nursing staff, and anesthesia staff in the  preoperative holding area. They were then transferred to the operating room and placed on the operative table in supine position.  Attention was directed to the right lower extremity at the site of previous TMA wound dehiscence, the wound appears very granular in nature.  At this time, IV dye was injected to detect the osteomyelitis which the bone appeared to be excellent vascular perfusion without signs of osteomyelitis.  The bone also clinically appeared hard indurated.  However it was determined patient will benefit from taking partial margin to help with the closure.  Using sagittal saw revisional partial first ray was carried out in standard technique.  This was sent to pathology.  Pathology review and determine if the proximal margins are clear of osteomyelitis.  The wound was thoroughly irrigated with normal saline solution the wound was primarily closed with skin staples and 3-0 Prolene.  All bony prominences were adequately padded.  The wound was dressed with Xeroform Kerlix Ace bandage and 4 x 4 gauze.  Disposition Patient is okay to be discharged from podiatric standpoint.  No dressing change until follow-up.  Patient will need doxycycline for 14 days.  Nonweightbearing to the left lower extremity if unable to do so partial weightbearing to the heel.  She will follow-up with me 1 week from discharge.  At the conclusion of the procedure the patient was awoken from anesthesia and found to have tolerated the procedure well any complications. There were transferred to PACU with vital signs stable and vascular status intact.  Boneta Lucks, DPM

## 2022-05-21 NOTE — Progress Notes (Signed)
     Daily Progress Note Intern Pager: [phone]  Patient name: Kana Reimann Medical record number: [id number] Date of birth: 09-14-1976 Age: 46 y.o. Gender: female  Primary Care Provider: [name], MD Consultants: Ortho, podiatry, ID Code Status: Full  Pt Overview and Major Events to Date:  6/9: Admitted, started on due to 6/10: 87 due to  Assessment and Plan: Ms. Chacko is a 46 year old female admitted for HHS  Pertinent PMH/PSH includes T2DM, COPD, LLE AKA, CKD stage III, bipolar disorder, OSA on BiPAP.    * Osteomyelitis of foot, right, acute [facility]) Podiatry is following and plan for excisional debridement of the wound. Patient complain of R knee pain that's tender on exam with surroung erythema and edema on the medial aspect. She's currently on broad spectrum antibiotic for osteo, will obtian XR of the knee. -Podiatry Following, appreciate recs - Continue vancomycin, cefipime, Flagyl  -Follow up R knee XR -PO Percocet 5-325 Q6H PRN -PO Tylenol 650 Q6H PRN -Vitals per floor routine     Type 2 diabetes mellitus with hyperosmolar hyperglycemic state (HHS) ([name]) CBG improving and ranged from 158-235. Got total SAI 110 and 85 basal -Continue 50u Semglee BID -Continue meal time to 30u Novolg TID  -Continue on resistant sliding scale insulin -Placed on carb modified diet -Continue CBG monitoring  AKI (acute kidney injury) ([name]) Creatinine stable on 1.76 -Continue to monitor kidney function with daily BMP labs. -Avoid nephrotoxic agents  History of Candida glabrata infection, right foot stump infection Continue IV Micafungin150 mg daily  Obstructive sleep apnea (adult) (pediatric) On home BiPAP nightly -continue nightly BiPAP  Diastolic HF (heart failure) ([name]) Euvolimic. -Will continue to hold home torsemide -Monitor daily weights -Monitor intake and output.   FEN/GI: N.p.o. PPx: Lovenox Dispo:CIR pending clinical improvement . Barriers include  excisional debridement of wound.   Subjective:  Ms. Perham was asleep but woke up for encounter.  She says she is doing well but endorses right LE pain at her knee cap  Objective: Temp:  [97.4 F (36.3 C)-98.2 F (36.8 C)] 97.4 F (36.3 C) (06/16 0821) Pulse Rate:  [90-113] 90 (06/16 0821) Resp:  [16-18] 18 (06/16 0821) BP: (117-154)/(78-92) 145/92 (06/16 0821) SpO2:  [96 %-100 %] 100 % (06/16 0821) Physical Exam: General: Alert, well appearing, NAD CV: RRR, no murmurs, normal S1/S2 Pulm: CTAB, good WOB on RA, no crackles or wheezing Abd: Soft, no distension, no tenderness Skin: dry, warm Ext: LLE aka, erythema below the R knee and tender        Laboratory: Most recent CBC Lab Results  Component Value Date   WBC 8.3 05/17/2022   HGB 10.1 (L) 05/17/2022   HCT 31.5 (L) 05/17/2022   MCV 91.3 05/17/2022   PLT 274 05/17/2022   Most recent BMP    Latest Ref Rng & Units 05/21/2022    4:15 AM  BMP  Glucose 70 - 99 mg/dL 189   BUN 6 - 20 mg/dL 56   Creatinine 0.44 - 1.00 mg/dL 1.76   Sodium 135 - 145 mmol/L 137   Potassium 3.5 - 5.1 mmol/L 4.7   Chloride 98 - 111 mmol/L 106   CO2 22 - 32 mmol/L 24   Calcium 8.9 - 10.3 mg/dL 8.6     Imaging/Diagnostic Tests: No new images  Alen Bleacher, MD 05/21/2022, 11:40 AM  PGY-1, [place] Intern pager: [phone], text pages welcome Secure chat group [place]

## 2022-05-21 NOTE — Telephone Encounter (Signed)
Resubmitted PA via [place] for generic percocet tablets.  Confirmation #[id number]

## 2022-05-21 NOTE — Anesthesia Postprocedure Evaluation (Signed)
Anesthesia Post Note  Patient: [name]  Procedure(s) Performed: REVISION AMPUTATION RAY 1ST (Right)     Patient location during evaluation: PACU Anesthesia Type: MAC Level of consciousness: awake and alert Pain management: pain level controlled Vital Signs Assessment: post-procedure vital signs reviewed and stable Respiratory status: spontaneous breathing, nonlabored ventilation, respiratory function stable and patient connected to nasal cannula oxygen Cardiovascular status: blood pressure returned to baseline and stable Postop Assessment: no apparent nausea or vomiting Anesthetic complications: no   No notable events documented.  Last Vitals:  Vitals:   05/21/22 1707 05/21/22 1759  BP: 115/69 124/80  Pulse: 86 89  Resp: 13 18  Temp: (!) 36.2 C 36.4 C  SpO2: 97% 91%    Last Pain:  Vitals:   05/21/22 1759  TempSrc: Oral  PainSc:                  [name]

## 2022-05-21 NOTE — Progress Notes (Signed)
FPTS Brief Progress Note  S: sleeping   O: BP (!) 154/78   Pulse (!) 110   Temp 98.2 F (36.8 C) (Oral)   Resp 16   Ht '5\' 2"'$  (1.575 m)   Wt (!) 162.3 kg   SpO2 97%   BMI 65.44 kg/m     A/P: - Orders reviewed. Labs for AM ordered, which was adjusted as needed.   Gladys Damme, MD 05/21/2022, 2:20 AM PGY-3, [facility] Night Resident  Please page [phone] with questions.

## 2022-05-21 NOTE — Anesthesia Procedure Notes (Signed)
Procedure Name: General with mask airway Date/Time: 05/21/2022 3:28 PM  Performed by: Erick Colace, CRNAPre-anesthesia Checklist: Patient identified, Emergency Drugs available, Suction available and Patient being monitored Patient Re-evaluated:Patient Re-evaluated prior to induction Oxygen Delivery Method: Simple face mask Preoxygenation: Pre-oxygenation with 100% oxygen Induction Type: IV induction Dental Injury: Teeth and Oropharynx as per pre-operative assessment

## 2022-05-21 NOTE — Anesthesia Preprocedure Evaluation (Signed)
Anesthesia Evaluation  Patient identified by MRN, date of birth, ID band Patient awake    Reviewed: Allergy & Precautions, NPO status , Patient's Chart, lab work & pertinent test results  History of Anesthesia Complications Negative for: history of anesthetic complications  Airway Mallampati: IV  TM Distance: >3 FB Neck ROM: Full    Dental   Pulmonary asthma , sleep apnea and Continuous Positive Airway Pressure Ventilation , COPD, former smoker,    Pulmonary exam normal        Cardiovascular hypertension, + CAD (mild, diffuse, nonobstructive by cath 11/26/20), + Peripheral Vascular Disease and +CHF  Normal cardiovascular exam+ dysrhythmias Supra Ventricular Tachycardia      Neuro/Psych Depression Bipolar Disorder negative neurological ROS     GI/Hepatic Neg liver ROS, GERD  ,  Endo/Other  diabetes, Type 2, Insulin DependentMorbid obesity  Renal/GU CRFRenal disease  negative genitourinary   Musculoskeletal negative musculoskeletal ROS (+)   Abdominal   Peds  Hematology  (+) Blood dyscrasia, anemia ,   Anesthesia Other Findings   Reproductive/Obstetrics                           Anesthesia Physical Anesthesia Plan  ASA: 4  Anesthesia Plan: MAC   Post-op Pain Management:    Induction: Intravenous  PONV Risk Score and Plan: 2 and TIVA and Treatment may vary due to age or medical condition  Airway Management Planned: Natural Airway, Nasal Cannula and Simple Face Mask  Additional Equipment: None  Intra-op Plan:   Post-operative Plan:   Informed Consent: I have reviewed the patients History and Physical, chart, labs and discussed the procedure including the risks, benefits and alternatives for the proposed anesthesia with the patient or authorized representative who has indicated his/her understanding and acceptance.       Plan Discussed with:   Anesthesia Plan Comments:          Anesthesia Quick Evaluation

## 2022-05-21 NOTE — Transfer of Care (Signed)
Immediate Anesthesia Transfer of Care Note  Patient: [name]  Procedure(s) Performed: REVISION AMPUTATION RAY 1ST (Right)  Patient Location: PACU  Anesthesia Type:MAC  Level of Consciousness: awake  Airway & Oxygen Therapy: Patient Spontanous Breathing  Post-op Assessment: Report given to RN and Post -op Vital signs reviewed and stable  Post vital signs: Reviewed and stable  Last Vitals:  Vitals Value Taken Time  BP 128/81 05/21/22 1607  Temp    Pulse 94 05/21/22 1609  Resp 14 05/21/22 1609  SpO2 92 % 05/21/22 1609  Vitals shown include unvalidated device data.  Last Pain:  Vitals:   05/21/22 1352  TempSrc:   PainSc: 6       Patients Stated Pain Goal: 0 (72/90/21 1155)  Complications: No notable events documented.

## 2022-05-21 NOTE — Telephone Encounter (Signed)
Received phone call from pharmacist regarding medication not going through. Pharmacist called [employer]. Per representative, the PA was approved for capsules and needs to be resubmitted for tablets.   Will forward to [place].   Talbot Grumbling, RN

## 2022-05-21 NOTE — Telephone Encounter (Signed)
Prior Auth for patients medication percocet tablets approved by MEDICAID from 05/21/22 to 11/17/22.  Patients pharmacy notified.

## 2022-05-21 NOTE — Progress Notes (Signed)
Physical Therapy Treatment Patient Details Name: [name] MRN: [id number] DOB: 08-23-1976 Today's Date: 05/21/2022   History of Present Illness 46 y.o. female who presented  05/14/22 with hypergylcemic event with CBG over 600 for the last 3 days, also endorses chest pain. +cellulitis rt foot with nonhealing diabetic foot ulcer. MRI showing increased soft tissue ulceration medially in the forefoot abutting the 1st metatarsal remnant; suspicious for osteomyelitis. 6/15 RLE NWB per podiatry, plan for OR 6/16. PMH: type 2 diabetes, morbid obesity, obesity hypoventilation syndrome, OSA wears BiPAP at night, hypertension, asthma, GERD, hyperlipidemia, restless leg syndrome, left BKA, R transmetatarsal amputation, mood disorder, hypertension, history of chest pain    PT Comments    Increased time to rouse this morning with pt reporting she didn't sleep well last night. She required min guard assist bed mobility with heavy use of bed rails. LLE prosthesis is still not in room. Family is suppose to bring it today. PT session cut short due to transport arriving to take pt to xray. She is then scheduled for OR.     Recommendations for follow up therapy are one component of a multi-disciplinary discharge planning process, led by the attending physician.  Recommendations may be updated based on patient status, additional functional criteria and insurance authorization.  Follow Up Recommendations  Acute inpatient rehab (3hours/day)     Assistance Recommended at Discharge Intermittent Supervision/Assistance  Patient can return home with the following A lot of help with walking and/or transfers;A lot of help with bathing/dressing/bathroom;Assistance with cooking/housework;Assist for transportation;Help with stairs or ramp for entrance   Equipment Recommendations  Other (comment) (TBD after sx)    Recommendations for Other Services Rehab consult     Precautions / Restrictions Precautions Precautions:  Other (comment);Fall Precaution Comments: monitor HR/SpO2, DOE Restrictions Weight Bearing Restrictions: Yes RLE Weight Bearing: Non weight bearing Other Position/Activity Restrictions: does not have LLE prosthetic in room (family should be bringing it 6/16)     Mobility  Bed Mobility Overal bed mobility: Needs Assistance Bed Mobility: Supine to Sit, Sit to Supine, Rolling Rolling: Min guard   Supine to sit: Min guard Sit to supine: Min guard   General bed mobility comments: +rail, increased time    Transfers                        Ambulation/Gait                   Stairs             Wheelchair Mobility    Modified Rankin (Stroke Patients Only)       Balance Overall balance assessment: Needs assistance Sitting-balance support: Bilateral upper extremity supported Sitting balance-Leahy Scale: Fair                                      Cognition Arousal/Alertness: Awake/alert Behavior During Therapy: WFL for tasks assessed/performed, Flat affect Overall Cognitive Status: No family/caregiver present to determine baseline cognitive functioning                                 General Comments: Cooperative but requiring increased time. Reports she did not sleep well last night making her sluggish.        Exercises      General Comments  Pertinent Vitals/Pain Pain Assessment Pain Assessment: Faces Faces Pain Scale: Hurts little more Pain Location: RLE Pain Descriptors / Indicators: Discomfort Pain Intervention(s): Repositioned, Monitored during session    Home Living                          Prior Function            PT Goals (current goals can now be found in the care plan section) Acute Rehab PT Goals Patient Stated Goal: home Progress towards PT goals: Progressing toward goals    Frequency    Min 3X/week      PT Plan Current plan remains appropriate     Co-evaluation              AM-PAC PT "6 Clicks" Mobility   Outcome Measure  Help needed turning from your back to your side while in a flat bed without using bedrails?: A Little Help needed moving from lying on your back to sitting on the side of a flat bed without using bedrails?: A Little Help needed moving to and from a bed to a chair (including a wheelchair)?: A Lot Help needed standing up from a chair using your arms (e.g., wheelchair or bedside chair)?: Total Help needed to walk in hospital room?: Total Help needed climbing 3-5 steps with a railing? : Total 6 Click Score: 11    End of Session   Activity Tolerance: Patient tolerated treatment well Patient left: in bed;with call bell/phone within reach;Other (comment) (transport in room to take pt to xray) Nurse Communication: Mobility status PT Visit Diagnosis: Muscle weakness (generalized) (M62.81);Pain Pain - Right/Left: Right Pain - part of body: Leg     Time: 0258-5277 PT Time Calculation (min) (ACUTE ONLY): 11 min  Charges:  $Therapeutic Activity: 8-22 mins                     Lorrin Goodell, PT  Office # [phone] Pager [phone]    Lorriane Shire 05/21/2022, 11:41 AM

## 2022-05-21 NOTE — Interval H&P Note (Signed)
History and Physical Interval Note:  05/21/2022 2:30 PM  [name]  has presented today for surgery, with the diagnosis of Right Foot infection.  The various methods of treatment have been discussed with the patient and family. After consideration of risks, benefits and other options for treatment, the patient has consented to  Procedure(s): REVISION AMPUTATION RAY 1ST (Right) as a surgical intervention.  The patient's history has been reviewed, patient examined, no change in status, stable for surgery.  I have reviewed the patient's chart and labs.  Questions were answered to the patient's satisfaction.     [name]

## 2022-05-22 ENCOUNTER — Encounter (HOSPITAL_COMMUNITY): Payer: Self-pay | Admitting: Podiatry

## 2022-05-22 DIAGNOSIS — E1100 Type 2 diabetes mellitus with hyperosmolarity without nonketotic hyperglycemic-hyperosmolar coma (NKHHC): Secondary | ICD-10-CM | POA: Diagnosis not present

## 2022-05-22 DIAGNOSIS — L089 Local infection of the skin and subcutaneous tissue, unspecified: Secondary | ICD-10-CM | POA: Diagnosis not present

## 2022-05-22 DIAGNOSIS — M86171 Other acute osteomyelitis, right ankle and foot: Secondary | ICD-10-CM | POA: Diagnosis not present

## 2022-05-22 DIAGNOSIS — N179 Acute kidney failure, unspecified: Secondary | ICD-10-CM | POA: Diagnosis not present

## 2022-05-22 LAB — GLUCOSE, CAPILLARY
Glucose-Capillary: 112 mg/dL — ABNORMAL HIGH (ref 70–99)
Glucose-Capillary: 152 mg/dL — ABNORMAL HIGH (ref 70–99)
Glucose-Capillary: 152 mg/dL — ABNORMAL HIGH (ref 70–99)
Glucose-Capillary: 160 mg/dL — ABNORMAL HIGH (ref 70–99)

## 2022-05-22 LAB — CBC
HCT: 31 % — ABNORMAL LOW (ref 36.0–46.0)
Hemoglobin: 9.4 g/dL — ABNORMAL LOW (ref 12.0–15.0)
MCH: 28.8 pg (ref 26.0–34.0)
MCHC: 30.3 g/dL (ref 30.0–36.0)
MCV: 95.1 fL (ref 80.0–100.0)
Platelets: 377 10*3/uL (ref 150–400)
RBC: 3.26 MIL/uL — ABNORMAL LOW (ref 3.87–5.11)
RDW: 15.1 % (ref 11.5–15.5)
WBC: 9.8 10*3/uL (ref 4.0–10.5)
nRBC: 0 % (ref 0.0–0.2)

## 2022-05-22 LAB — BASIC METABOLIC PANEL
Anion gap: 6 (ref 5–15)
BUN: 50 mg/dL — ABNORMAL HIGH (ref 6–20)
CO2: 26 mmol/L (ref 22–32)
Calcium: 8.7 mg/dL — ABNORMAL LOW (ref 8.9–10.3)
Chloride: 108 mmol/L (ref 98–111)
Creatinine, Ser: 1.89 mg/dL — ABNORMAL HIGH (ref 0.44–1.00)
GFR, Estimated: 33 mL/min — ABNORMAL LOW (ref >60)
Glucose, Bld: 92 mg/dL (ref 70–99)
Potassium: 5 mmol/L (ref 3.5–5.1)
Sodium: 140 mmol/L (ref 135–145)

## 2022-05-22 MED ORDER — COLCHICINE 0.3 MG HALF TABLET
0.3000 mg | ORAL_TABLET | Freq: Every day | ORAL | Status: DC
  Administered 2022-05-23 – 2022-05-25 (×3): 0.3 mg via ORAL
  Filled 2022-05-22 (×4): qty 1

## 2022-05-22 MED ORDER — COLCHICINE 0.6 MG PO TABS
0.6000 mg | ORAL_TABLET | Freq: Once | ORAL | Status: AC
Start: 2022-05-22 — End: 2022-05-22
  Administered 2022-05-22: 0.6 mg via ORAL
  Filled 2022-05-22: qty 1

## 2022-05-22 MED ORDER — COLCHICINE 0.6 MG PO TABS: 0.6000 mg | ORAL_TABLET | Freq: Every day | ORAL | Status: DC

## 2022-05-22 MED ORDER — AMOXICILLIN-POT CLAVULANATE ER 1000-62.5 MG PO TB12
2.0000 | ORAL_TABLET | Freq: Two times a day (BID) | ORAL | Status: DC
  Administered 2022-05-22 – 2022-05-25 (×7): 2 via ORAL
  Filled 2022-05-22 (×8): qty 2

## 2022-05-22 MED ORDER — ALTEPLASE 2 MG IJ SOLR
2.0000 mg | Freq: Once | INTRAMUSCULAR | Status: AC
  Administered 2022-05-22: 2 mg
  Filled 2022-05-22: qty 2

## 2022-05-22 MED ORDER — HYDROXYZINE HCL 25 MG PO TABS
25.0000 mg | ORAL_TABLET | Freq: Once | ORAL | Status: AC
  Administered 2022-05-22: 25 mg via ORAL
  Filled 2022-05-22: qty 1

## 2022-05-22 MED ORDER — SODIUM CHLORIDE 0.9 % IV SOLN
2.0000 g | Freq: Two times a day (BID) | INTRAVENOUS | Status: DC
  Administered 2022-05-22: 2 g via INTRAVENOUS
  Filled 2022-05-22: qty 12.5

## 2022-05-22 NOTE — Progress Notes (Signed)
  Subjective:  Patient ID: [name], female    DOB: [birth year],  MRN: [id number]  A 46 y.o. female presents with right foot osteomyelitis status post day 1 partial first ray amputation with primary closure.  She states she is doing okay.  Minimal pain.  No nausea fever chills vomiting.  She has been nonweightbearing to the right foot. Objective:   Vitals:   05/21/22 2204 05/22/22 0746  BP: (!) 145/60 (!) 174/98  Pulse: 91 94  Resp: 16 20  Temp: 98.8 F (37.1 C) 97.8 F (36.6 C)  SpO2: 97% 98%   General AA&O x3. Normal mood and affect.  Vascular Dorsalis pedis and posterior tibial pulses 2/4 bilat. Brisk capillary refill to all digits. Pedal hair present.  Neurologic Epicritic sensation grossly intact.  Dermatologic Bandages clean dry and intact.  No calf pain noted.  Motor or sensory functions are intact.  Orthopedic: MMT 5/5 in dorsiflexion, plantarflexion, inversion, and eversion. Normal joint ROM without pain or crepitus.    Assessment & Plan:  Patient was evaluated and treated and all questions answered.  Right foot osteomyelitis status post partial first ray amputation revision of with primary closure -All questions and concerns were discussed with the patient in extensive detail -Patient is okay to be discharged from podiatric standpoint.  She can be discharged on p.o. doxycycline will extend based on cultures. -Nonweightbearing to the right foot -She will follow-up in our clinic 1 week from discharge -No dressing change until follow-up. -No further surgical plans  [name], DPM  Accessible via secure chat for questions or concerns.

## 2022-05-22 NOTE — Progress Notes (Signed)
FPTS Brief Progress Note  S Notified by day RN that pt was drowsy. On arrival pt was alert and speaking in full sentences to me. She reports feeling tremors and does not feel right but does not know why. She was unable to eat dinner due to the tremors. Does not drink alcohol. Pt denies chest pain, dizziness, dyspnea, palpitations or anxiety. She wants to go home.   O: BP (!) 148/90   Pulse (!) 102   Temp 99.1 F (37.3 C) (Axillary)   Resp 20   Ht '5\' 2"'$  (1.575 m)   Wt (!) 162.3 kg   SpO2 100%   BMI 65.44 kg/m    General: Alert, no acute distress, [place] Cardio: Normal S1 and S2, RRR, no r/m/g Pulm: CTAB, normal work of breathing Neuro: Cranial nerves grossly intact   A/P: Plan per day team  -One dose of hydroxyzine  -Monitor vitals and symptoms overnight -Rn to page FPTS if any concerns about patient   Lattie Haw, MD 05/22/2022, 10:36 PM PGY-3, [facility] Night Resident  Please page [phone] with questions.

## 2022-05-22 NOTE — Progress Notes (Addendum)
1946 Pt sitting up eating dinner, pt crying because her right hand has tremors. Pt very upset, talking on phone to mom. Mom asked RN to page MD. RN sent message via secure chat. VSS, NAD. Pt has been sleepy most of the day but would easily arouse to verbal stimuli. RN had pt wear C-Pap when napping throughout the day and [facility] when awake and eating meals. NAD, fall precautions in place, awaiting to hear back from MD.  1930 Report given to night RN.   2000 Md is on the way to see the pt.

## 2022-05-22 NOTE — Progress Notes (Signed)
Physical Therapy Treatment Patient Details Name: [name] MRN: [id number] DOB: September 11, 1976 Today's Date: 05/22/2022   History of Present Illness Pt is a 46 y.o. female who presented  05/14/22 with hypergylcemic event with CBG over 600 for the last 3 days, also endorses chest pain. +cellulitis rt foot with nonhealing diabetic foot ulcer. MRI showing increased soft tissue ulceration medially in the forefoot abutting the 1st metatarsal remnant; suspicious for osteomyelitis. Pt now s/p R partial first ray amputation with primary closure. PMH: type 2 diabetes, morbid obesity, obesity hypoventilation syndrome, OSA wears BiPAP at night, hypertension, asthma, GERD, hyperlipidemia, restless leg syndrome, left BKA, R transmetatarsal amputation, mood disorder, hypertension, history of chest pain    PT Comments    Pt very confused, with difficulties with attention/focus and altered cognition throughout session (highly likely due to medications per RN). Pt was able to complete bed mobility at a supervision level and sit upright at EOB for ~15 minutes without a loss of balance with standby assist for safety. Pt would continue to benefit from skilled physical therapy services at this time while admitted and after d/c to address the below listed limitations in order to improve overall safety and independence with functional mobility.   Recommendations for follow up therapy are one component of a multi-disciplinary discharge planning process, led by the attending physician.  Recommendations may be updated based on patient status, additional functional criteria and insurance authorization.  Follow Up Recommendations  Acute inpatient rehab (3hours/day)     Assistance Recommended at Discharge Intermittent Supervision/Assistance  Patient can return home with the following A lot of help with walking and/or transfers;A lot of help with bathing/dressing/bathroom;Assistance with cooking/housework;Assist for  transportation;Help with stairs or ramp for entrance   Equipment Recommendations  Other (comment) (TBD pending further mobility progression)    Recommendations for Other Services Rehab consult     Precautions / Restrictions Precautions Precautions: Other (comment);Fall Precaution Comments: hx of L BKA, prosthesis in room Restrictions Weight Bearing Restrictions: Yes RLE Weight Bearing: Non weight bearing     Mobility  Bed Mobility Overal bed mobility: Needs Assistance Bed Mobility: Supine to Sit, Sit to Supine     Supine to sit: Supervision Sit to supine: Supervision   General bed mobility comments: +rail, increased time    Transfers                   General transfer comment: deferred secondary to pt's cognition and safety concerns    Ambulation/Gait                   Stairs             Wheelchair Mobility    Modified Rankin (Stroke Patients Only)       Balance Overall balance assessment: Needs assistance Sitting-balance support: No upper extremity supported Sitting balance-Leahy Scale: Good                                      Cognition Arousal/Alertness: Lethargic, Suspect due to medications Behavior During Therapy: WFL for tasks assessed/performed Overall Cognitive Status: Impaired/Different from baseline Area of Impairment: Attention, Following commands, Memory, Safety/judgement, Awareness, Problem solving                   Current Attention Level: Selective Memory: Decreased short-term memory, Decreased recall of precautions Following Commands: Follows one step commands with increased time, Follows one step commands  inconsistently Safety/Judgement: Decreased awareness of deficits, Decreased awareness of safety Awareness: Intellectual Problem Solving: Slow processing, Decreased initiation, Difficulty sequencing, Requires verbal cues, Requires tactile cues          Exercises      General Comments         Pertinent Vitals/Pain Pain Assessment Pain Assessment: Faces Faces Pain Scale: Hurts little more Pain Location: RLE Pain Descriptors / Indicators: Discomfort Pain Intervention(s): Monitored during session, Repositioned    Home Living                          Prior Function            PT Goals (current goals can now be found in the care plan section) Acute Rehab PT Goals PT Goal Formulation: With patient Time For Goal Achievement: 06/02/22 Potential to Achieve Goals: Good Progress towards PT goals: Progressing toward goals    Frequency    Min 3X/week      PT Plan Current plan remains appropriate    Co-evaluation              AM-PAC PT "6 Clicks" Mobility   Outcome Measure  Help needed turning from your back to your side while in a flat bed without using bedrails?: None Help needed moving from lying on your back to sitting on the side of a flat bed without using bedrails?: None Help needed moving to and from a bed to a chair (including a wheelchair)?: A Lot Help needed standing up from a chair using your arms (e.g., wheelchair or bedside chair)?: A Lot Help needed to walk in hospital room?: Total Help needed climbing 3-5 steps with a railing? : Total 6 Click Score: 14    End of Session Equipment Utilized During Treatment: Oxygen Activity Tolerance: Patient tolerated treatment well Patient left: in bed;with call bell/phone within reach;Other (comment) (sitting up at EOB with NT present and pt requesting assistance with bathing) Nurse Communication: Mobility status PT Visit Diagnosis: Muscle weakness (generalized) (M62.81);Pain Pain - Right/Left: Right Pain - part of body: Leg     Time: 0160-1093 PT Time Calculation (min) (ACUTE ONLY): 31 min  Charges:  $Therapeutic Activity: 8-22 mins                     Anastasio Champion, DPT  Acute Rehabilitation Services Office [place] 05/22/2022, 2:11 PM

## 2022-05-22 NOTE — Progress Notes (Signed)
PHARMACY NOTE:  ANTIMICROBIAL RENAL DOSAGE ADJUSTMENT  Current antimicrobial regimen includes a mismatch between antimicrobial dosage and estimated renal function.  As per policy approved by the Pharmacy & Therapeutics and Medical Executive Committees, the antimicrobial dosage will be adjusted accordingly.  Current antimicrobial dosage:  cefepime 2g q8h  Indication: osteomyelitis   Renal Function: Estimated Creatinine Clearance: 57 mL/min (A) (by C-G formula based on SCr of 1.89 mg/dL (H)).    Antimicrobial dosage has been changed to:  cefepime 2g q12h  Thank you for allowing pharmacy to be a part of this patient's care.  Levonne Spiller, [facility] 05/22/2022 7:28 AM

## 2022-05-22 NOTE — Progress Notes (Signed)
[facility] for Infectious Disease   Reason for visit: possible osteomyelitis  Interval History: s/p operative management by Dr. Posey Pronto.  Afebrile, WBC 9.8.    Physical Exam: Constitutional:  Vitals:   05/22/22 0746 05/22/22 0948  BP: (!) 174/98 (!) 141/73  Pulse: 94 98  Resp: 20 19  Temp: 97.8 F (36.6 C)   SpO2: 98% 99%   patient appears in NAD Respiratory: Normal respiratory effort  Review of Systems: Constitutional: negative for fevers and chills  Lab Results  Component Value Date   WBC 9.8 05/22/2022   HGB 9.4 (L) 05/22/2022   HCT 31.0 (L) 05/22/2022   MCV 95.1 05/22/2022   PLT 377 05/22/2022    Lab Results  Component Value Date   CREATININE 1.89 (H) 05/22/2022   BUN 50 (H) 05/22/2022   NA 140 05/22/2022   K 5.0 05/22/2022   CL 108 05/22/2022   CO2 26 05/22/2022    Lab Results  Component Value Date   ALT 19 04/11/2022   AST 19 04/11/2022   ALKPHOS 164 (H) 04/11/2022     Microbiology: Recent Results (from the past 240 hour(s))  Blood culture (routine x 2)     Status: None   Collection Time: 05/14/22 12:34 PM   Specimen: BLOOD  Result Value Ref Range Status   Specimen Description BLOOD BLOOD RIGHT ARM  Final   Special Requests   Final    BOTTLES DRAWN AEROBIC AND ANAEROBIC Blood Culture adequate volume   Culture   Final    NO GROWTH 5 DAYS Performed at [facility], [address] [place], [place] [address]    Report Status 05/19/2022 FINAL  Final  Blood culture (routine x 2)     Status: None   Collection Time: 05/14/22  6:24 PM   Specimen: BLOOD LEFT HAND  Result Value Ref Range Status   Specimen Description BLOOD LEFT HAND  Final   Special Requests   Final    BOTTLES DRAWN AEROBIC ONLY Blood Culture adequate volume   Culture   Final    NO GROWTH 5 DAYS Performed at [facility], [place] [address] [place], [place] [address]    Report Status 05/19/2022 FINAL  Final  MRSA Next Gen by PCR, Nasal     Status: None   Collection  Time: 05/15/22  5:56 AM   Specimen: Nasal Mucosa; Nasal Swab  Result Value Ref Range Status   MRSA by PCR Next Gen NOT DETECTED NOT DETECTED Final    Comment: (NOTE) The GeneXpert MRSA Assay (FDA approved for NASAL specimens only), is one component of a comprehensive MRSA colonization surveillance program. It is not intended to diagnose MRSA infection nor to guide or monitor treatment for MRSA infections. Test performance is not FDA approved in patients less than 71 years old. Performed at [facility], [place] [address] [place], [place] [address]   Surgical pcr screen     Status: None   Collection Time: 05/21/22 12:24 PM   Specimen: Nasal Mucosa; Nasal Swab  Result Value Ref Range Status   MRSA, PCR NEGATIVE NEGATIVE Final   Staphylococcus aureus NEGATIVE NEGATIVE Final    Comment: (NOTE) The Xpert SA Assay (FDA approved for NASAL specimens in patients 39 years of age and older), is one component of a comprehensive surveillance program. It is not intended to diagnose infection nor to guide or monitor treatment. Performed at [facility], [place] [address] [place], [place]  [address]     Impression/Plan:  1. Right extremity osteomyelitis - concern for osteomyelitis on MRI at 1st metatarsal remnant with operative management but no signs of infected bone and further debrided and sent to pathology.  With the wound, I recommend a 7 day course of Augmentin with the possibilty of remaining soft tissue infection.    2.  AKI - creat improved to 1.89 and stable today.    I will sign off, call with questions

## 2022-05-22 NOTE — Progress Notes (Signed)
  S:Went to bedside to check on patient, she was resting comfortably with BiPAP in place. I did not wake the patient.   O: BP (!) 145/60 (BP Location: Left Arm)   Pulse 91   Temp 98.8 F (37.1 C) (Axillary)   Resp 16   Ht '5\' 2"'$  (1.575 m)   Wt (!) 162.3 kg   SpO2 97%   BMI 65.44 kg/m   General: Patient resting comfortably, in no acute distress. Resp: normal work of breathing noted  A/P: Vitals stable and orders reviewed. No changes, continue pllan per day team.   Donney Dice, DO 05/22/2022, 12:00 AM PGY-2, [place] Medicine Service pager [phone]

## 2022-05-22 NOTE — Progress Notes (Addendum)
Daily Progress Note Intern Pager: [phone]  Patient name: [name] Medical record number: [id number] Date of birth: 1976/07/25 Age: 46 y.o. Gender: female  Primary Care Provider: [name], MD Consultants: Ortho, podiatry, ID Code Status: Full  Pt Overview and Major Events to Date:  6/9: Admitted, Endo tool on my 6/10: Endo tool off 6/16: Excisional debridement of R foot    Assessment and Plan: [name] is a 46 year old female admitted for HHS that is since resolved.  Pertinent PMH/PSH includes T2DM, COPD, LLE AKA, CKD stage III, bipolar disorder, OSA on BiPAP.   * Osteomyelitis of foot, right, acute ([name]) Patient is POD #1 of  excisional debridement of the right foot wound with pathology sent out to assess for clear margins. She is cleared for discharge per podiatry and to be non weight bearing on the right foot.   This morning wound is dressing is intact and clean. Will do 7 day of Augmentin and PT recommend CIR, will explore proper disposition for patient -Podiatry Following, appreciate recs - start Augmentin '1000mg'$  BID for 7 days -Follow up excision pathology -Nonweight bearing on right foot -Follow up with podiatry in one week -PO Percocet 5-325 Q6H PRN -PO Tylenol 650 Q6H PRN -Vitals per floor routine     Gout of knee, Right Patient complained of R knee pain in the last few days. Xray yesterday showed severe patellofemoral.  On exam right knee is severely tender and has surrounding edema and erythema.  Recent lab showed elevated uric acid to 10.  We will start patient on renal dosed colchicine treatment. - Start Colchicine 0.'6mg'$  daily, transition to 0.'3mg'$  tomorrow -Apply cold compress to knee -As needed home Percocet    Type 2 diabetes mellitus with hyperosmolar hyperglycemic state (HHS) ([place]) Patient was NPO yesterday and got 10u SAI and 50u basal. CBG yesterday was improved and this morning was 112. -Continue 50u Semglee BID -Continue meal  time to 30u Novolg TID  -Continue on resistant sliding scale insulin -Placed on carb modified diet -Continue CBG monitoring  AKI (acute kidney injury) ([name]) Creatinine this morning was 1.89 -Encorauged PO fluid intake -Continue to monitor kidney function with daily BMP labs. -Avoid nephrotoxic agents  History of Candida glabrata infection, right foot stump infection Continue IV Micafungin150 mg daily  Obstructive sleep apnea (adult) (pediatric) On home BiPAP nightly -continue nightly BiPAP  Diastolic HF (heart failure) ([name]) Euvolimic. -Will continue to hold home torsemide -Monitor daily weights -Monitor intake and output.       FEN/GI: Carb modified diet PPx: Lovenox Dispo:CIR tomorrow. Barriers include pending insurance authorization.   Subjective:  [name] reports feeling good but still endorses right knee pain.  Objective: Temp:  [97.2 F (36.2 C)-98.8 F (37.1 C)] 97.8 F (36.6 C) (06/17 0746) Pulse Rate:  [85-109] 98 (06/17 0948) Resp:  [13-20] 19 (06/17 0948) BP: (84-195)/(53-123) 141/73 (06/17 0948) SpO2:  [91 %-99 %] 99 % (06/17 0948) Physical Exam: General: Alert, well appearing, NAD CV: RRR, no murmurs, normal S1/S2 Pulm: CTAB, good WOB on RA, Abd: Soft, no distension, no tenderness Ext: R knee tenderness with surrounding edema and erythema.  R foot dressed in dry and clean bandage.   Laboratory: Most recent CBC Lab Results  Component Value Date   WBC 9.8 05/22/2022   HGB 9.4 (L) 05/22/2022   HCT 31.0 (L) 05/22/2022   MCV 95.1 05/22/2022   PLT 377 05/22/2022   Most recent BMP    Latest Ref Rng &  Units 05/22/2022    2:57 AM  BMP  Glucose 70 - 99 mg/dL 92   BUN 6 - 20 mg/dL 50   Creatinine 0.44 - 1.00 mg/dL 1.89   Sodium 135 - 145 mmol/L 140   Potassium 3.5 - 5.1 mmol/L 5.0   Chloride 98 - 111 mmol/L 108   CO2 22 - 32 mmol/L 26   Calcium 8.9 - 10.3 mg/dL 8.7      Imaging/Diagnostic Tests: No new images  Alen Bleacher,  MD 05/22/2022, 12:27 PM  PGY-1, [place] Intern pager: [phone], text pages welcome Secure chat group [place]

## 2022-05-22 NOTE — Plan of Care (Signed)
  Problem: Education: Goal: Knowledge of General Education information will improve Description: Including pain rating scale, medication(s)/side effects and non-pharmacologic comfort measures Outcome: Progressing   Problem: Activity: Goal: Risk for activity intolerance will decrease Outcome: Progressing   Problem: Coping: Goal: Level of anxiety will decrease Outcome: Progressing   Problem: Elimination: Goal: Will not experience complications related to bowel motility Outcome: Progressing Goal: Will not experience complications related to urinary retention Outcome: Progressing   Problem: Pain Managment: Goal: General experience of comfort will improve Outcome: Progressing   

## 2022-05-22 NOTE — Assessment & Plan Note (Addendum)
Improving -Continue colchicine 0.3 mg daily -Continue cold compresses -Home Percocet as needed

## 2022-05-23 DIAGNOSIS — M109 Gout, unspecified: Secondary | ICD-10-CM

## 2022-05-23 DIAGNOSIS — N179 Acute kidney failure, unspecified: Secondary | ICD-10-CM | POA: Diagnosis not present

## 2022-05-23 DIAGNOSIS — M86171 Other acute osteomyelitis, right ankle and foot: Secondary | ICD-10-CM | POA: Diagnosis not present

## 2022-05-23 DIAGNOSIS — E875 Hyperkalemia: Secondary | ICD-10-CM

## 2022-05-23 LAB — GLUCOSE, CAPILLARY
Glucose-Capillary: 186 mg/dL — ABNORMAL HIGH (ref 70–99)
Glucose-Capillary: 204 mg/dL — ABNORMAL HIGH (ref 70–99)
Glucose-Capillary: 64 mg/dL — ABNORMAL LOW (ref 70–99)
Glucose-Capillary: 64 mg/dL — ABNORMAL LOW (ref 70–99)
Glucose-Capillary: 137 mg/dL — ABNORMAL HIGH (ref 70–99)
Glucose-Capillary: 165 mg/dL — ABNORMAL HIGH (ref 70–99)

## 2022-05-23 LAB — BASIC METABOLIC PANEL
Anion gap: 8 (ref 5–15)
BUN: 44 mg/dL — ABNORMAL HIGH (ref 6–20)
CO2: 25 mmol/L (ref 22–32)
Calcium: 8.9 mg/dL (ref 8.9–10.3)
Chloride: 107 mmol/L (ref 98–111)
Creatinine, Ser: 1.72 mg/dL — ABNORMAL HIGH (ref 0.44–1.00)
GFR, Estimated: 37 mL/min — ABNORMAL LOW (ref >60)
Glucose, Bld: 202 mg/dL — ABNORMAL HIGH (ref 70–99)
Potassium: 5.2 mmol/L — ABNORMAL HIGH (ref 3.5–5.1)
Sodium: 140 mmol/L (ref 135–145)

## 2022-05-23 MED ORDER — SODIUM ZIRCONIUM CYCLOSILICATE 10 G PO PACK
10.0000 g | PACK | Freq: Once | ORAL | Status: AC
  Administered 2022-05-23: 10 g via ORAL
  Filled 2022-05-23: qty 1

## 2022-05-23 NOTE — Progress Notes (Signed)
Daily Progress Note Intern Pager: [phone]  Patient name: [name] Medical record number: [id number] Date of birth: 1976-11-06 Age: 46 y.o. Gender: female  Primary Care Provider: [name], MD Consultants: Podiatry, ID (s/o), ortho (s/o) Code Status: Full  Pt Overview and Major Events to Date:  6/9: Admitted for HHS, started on insulin drip via Endotool 6/10: Off Endotool (transitioned to subcu insulin) 6/16: OR for revision of R first ray amputation  Assessment and Plan:  [name] is a 46 year old female who was initially admitted with HHS that is now resolved, in the setting of right foot osteomyelitis. PMH/PSH includes poorly controlled T2DM, s/p L AKA, CKD stage III, OSA on BiPAP, bipolar disorder, and COPD.   * Osteomyelitis of foot, right, acute ([place]) POD #2 s/p revision of R first ray amputation. Receiving minimal prn pain meds. -Podiatry following, appreciate their expertise -Continue Augmentin (7 day course, last dose 6/23)  -f/u surgical pathology to assess margins -Nonweight bearing on right foot -Tylenol '650mg'$  q6h prn -Percocet 7.5-325 q6h prn  Gout of knee, Right Improving -Colchicine 0.'3mg'$  daily -Cold compresses -As needed home Percocet  Type 2 diabetes mellitus with hyperosmolar hyperglycemic state (HHS) ([place]) HHS resolved. Appropriate glycemic control (CBG [id number]) over past 24 hours. -Continue Semglee 50 units BID -Continue Novolog units TID with meals -Continue resistant SSI -Carb modified diet -CBG monitoring qAC and qHS  History of Candida glabrata infection, right foot stump infection Continue IV Micafungin 150 mg daily for now. Will discuss with ID re: duration.  Diastolic HF (heart failure) ([name]) Euvolemic. -Holding home torsemide due to recent AKI -Likely resume torsemide tomorrow if kidney function remains stable -Daily weights, strict I's and O's  Hyperkalemia K 5.2 this morning. -Lokelma 10g x1 -Daily  BMP    FEN/GI: Carb modified diet PPx: Lovenox Dispo:CIR in 2-3 days. Barriers include CIR bed availability  Subjective:  Patient complains of R foot pain this morning- has not received pain meds since yesterday morning. Otherwise denies concerns. States her R knee is much improved. Also had some anxiety last night but reports feeling much better this morning.  Objective: Temp:  [99.1 F (37.3 C)] 99.1 F (37.3 C) (06/17 2000) Pulse Rate:  [102-105] 102 (06/17 2100) Resp:  [20] 20 (06/17 2000) BP: (137-193)/(90-107) 148/90 (06/17 2100) SpO2:  [100 %] 100 % (06/17 2100) Physical Exam: General: alert, NAD Cardiovascular: RRR, normal S1/S2 Respiratory: normal effort, lungs sound clear but exam limited secondary to body habits Abdomen: obese, soft, nontender Extremities: s/p L BKA. Dressing c/d/i on R foot stump. Mildly increased warmth on R knee with minimal erythema (improved from prior).  Laboratory: Most recent CBC Lab Results  Component Value Date   WBC 9.8 05/22/2022   HGB 9.4 (L) 05/22/2022   HCT 31.0 (L) 05/22/2022   MCV 95.1 05/22/2022   PLT 377 05/22/2022   Most recent BMP    Latest Ref Rng & Units 05/23/2022    3:05 AM  BMP  Glucose 70 - 99 mg/dL 202   BUN 6 - 20 mg/dL 44   Creatinine 0.44 - 1.00 mg/dL 1.72   Sodium 135 - 145 mmol/L 140   Potassium 3.5 - 5.1 mmol/L 5.2   Chloride 98 - 111 mmol/L 107   CO2 22 - 32 mmol/L 25   Calcium 8.9 - 10.3 mg/dL 8.9     Other pertinent labs: uric acid 10.2  Imaging/Diagnostic Tests: No new imaging/diagnostic tests over past 24h.  Alcus Dad, MD 05/23/2022, 10:58  AM PGY-2, [place] Intern pager: [phone], text pages welcome Secure chat group [place]

## 2022-05-23 NOTE — Progress Notes (Signed)
Inpatient Rehabilitation Admissions Coordinator   Postoperatively patient very confused which limited her ability to participate with therapy on 6/17. I await further progress to assist with planning most appropriate rehab venue.  Danne Baxter, RN, MSN Rehab Admissions Coordinator [phone] 05/23/2022 7:29 PM

## 2022-05-23 NOTE — Assessment & Plan Note (Addendum)
Resolved, K this morning was 4.5 -Monitor with daily BMP.

## 2022-05-23 NOTE — Plan of Care (Signed)
  Problem: Education: Goal: Knowledge of General Education information will improve Description: Including pain rating scale, medication(s)/side effects and non-pharmacologic comfort measures Outcome: Progressing   Problem: Safety: Goal: Ability to remain free from injury will improve Outcome: Progressing   Problem: Activity: Goal: Risk for activity intolerance will decrease Outcome: Not Progressing   Problem: Coping: Goal: Level of anxiety will decrease Outcome: Not Progressing   Problem: Skin Integrity: Goal: Risk for impaired skin integrity will decrease Outcome: Not Progressing

## 2022-05-24 DIAGNOSIS — M10061 Idiopathic gout, right knee: Secondary | ICD-10-CM | POA: Diagnosis not present

## 2022-05-24 DIAGNOSIS — M86171 Other acute osteomyelitis, right ankle and foot: Secondary | ICD-10-CM | POA: Diagnosis not present

## 2022-05-24 DIAGNOSIS — N179 Acute kidney failure, unspecified: Secondary | ICD-10-CM | POA: Diagnosis not present

## 2022-05-24 LAB — GLUCOSE, CAPILLARY
Glucose-Capillary: 136 mg/dL — ABNORMAL HIGH (ref 70–99)
Glucose-Capillary: 162 mg/dL — ABNORMAL HIGH (ref 70–99)
Glucose-Capillary: 247 mg/dL — ABNORMAL HIGH (ref 70–99)
Glucose-Capillary: 160 mg/dL — ABNORMAL HIGH (ref 70–99)

## 2022-05-24 LAB — CBC
HCT: 30.5 % — ABNORMAL LOW (ref 36.0–46.0)
Hemoglobin: 9.5 g/dL — ABNORMAL LOW (ref 12.0–15.0)
MCH: 29.4 pg (ref 26.0–34.0)
MCHC: 31.1 g/dL (ref 30.0–36.0)
MCV: 94.4 fL (ref 80.0–100.0)
Platelets: 443 10*3/uL — ABNORMAL HIGH (ref 150–400)
RBC: 3.23 MIL/uL — ABNORMAL LOW (ref 3.87–5.11)
RDW: 14.8 % (ref 11.5–15.5)
WBC: 10.9 10*3/uL — ABNORMAL HIGH (ref 4.0–10.5)
nRBC: 0 % (ref 0.0–0.2)

## 2022-05-24 LAB — BASIC METABOLIC PANEL
Anion gap: 7 (ref 5–15)
BUN: 34 mg/dL — ABNORMAL HIGH (ref 6–20)
CO2: 26 mmol/L (ref 22–32)
Calcium: 8.8 mg/dL — ABNORMAL LOW (ref 8.9–10.3)
Chloride: 106 mmol/L (ref 98–111)
Creatinine, Ser: 1.28 mg/dL — ABNORMAL HIGH (ref 0.44–1.00)
GFR, Estimated: 53 mL/min — ABNORMAL LOW (ref >60)
Glucose, Bld: 164 mg/dL — ABNORMAL HIGH (ref 70–99)
Potassium: 4.8 mmol/L (ref 3.5–5.1)
Sodium: 139 mmol/L (ref 135–145)

## 2022-05-24 LAB — SURGICAL PATHOLOGY

## 2022-05-24 IMAGING — DX DG CHEST 1V PORT
1 series · 1 of 1 positions shown · non-contrast
Comparison: Most recent radiograph 11/17/2020. Most recent CT
11/07/2020

CLINICAL DATA: Cough and body aches.  Shortness of breath.

EXAM:
PORTABLE CHEST 1 VIEW

[chest ap]
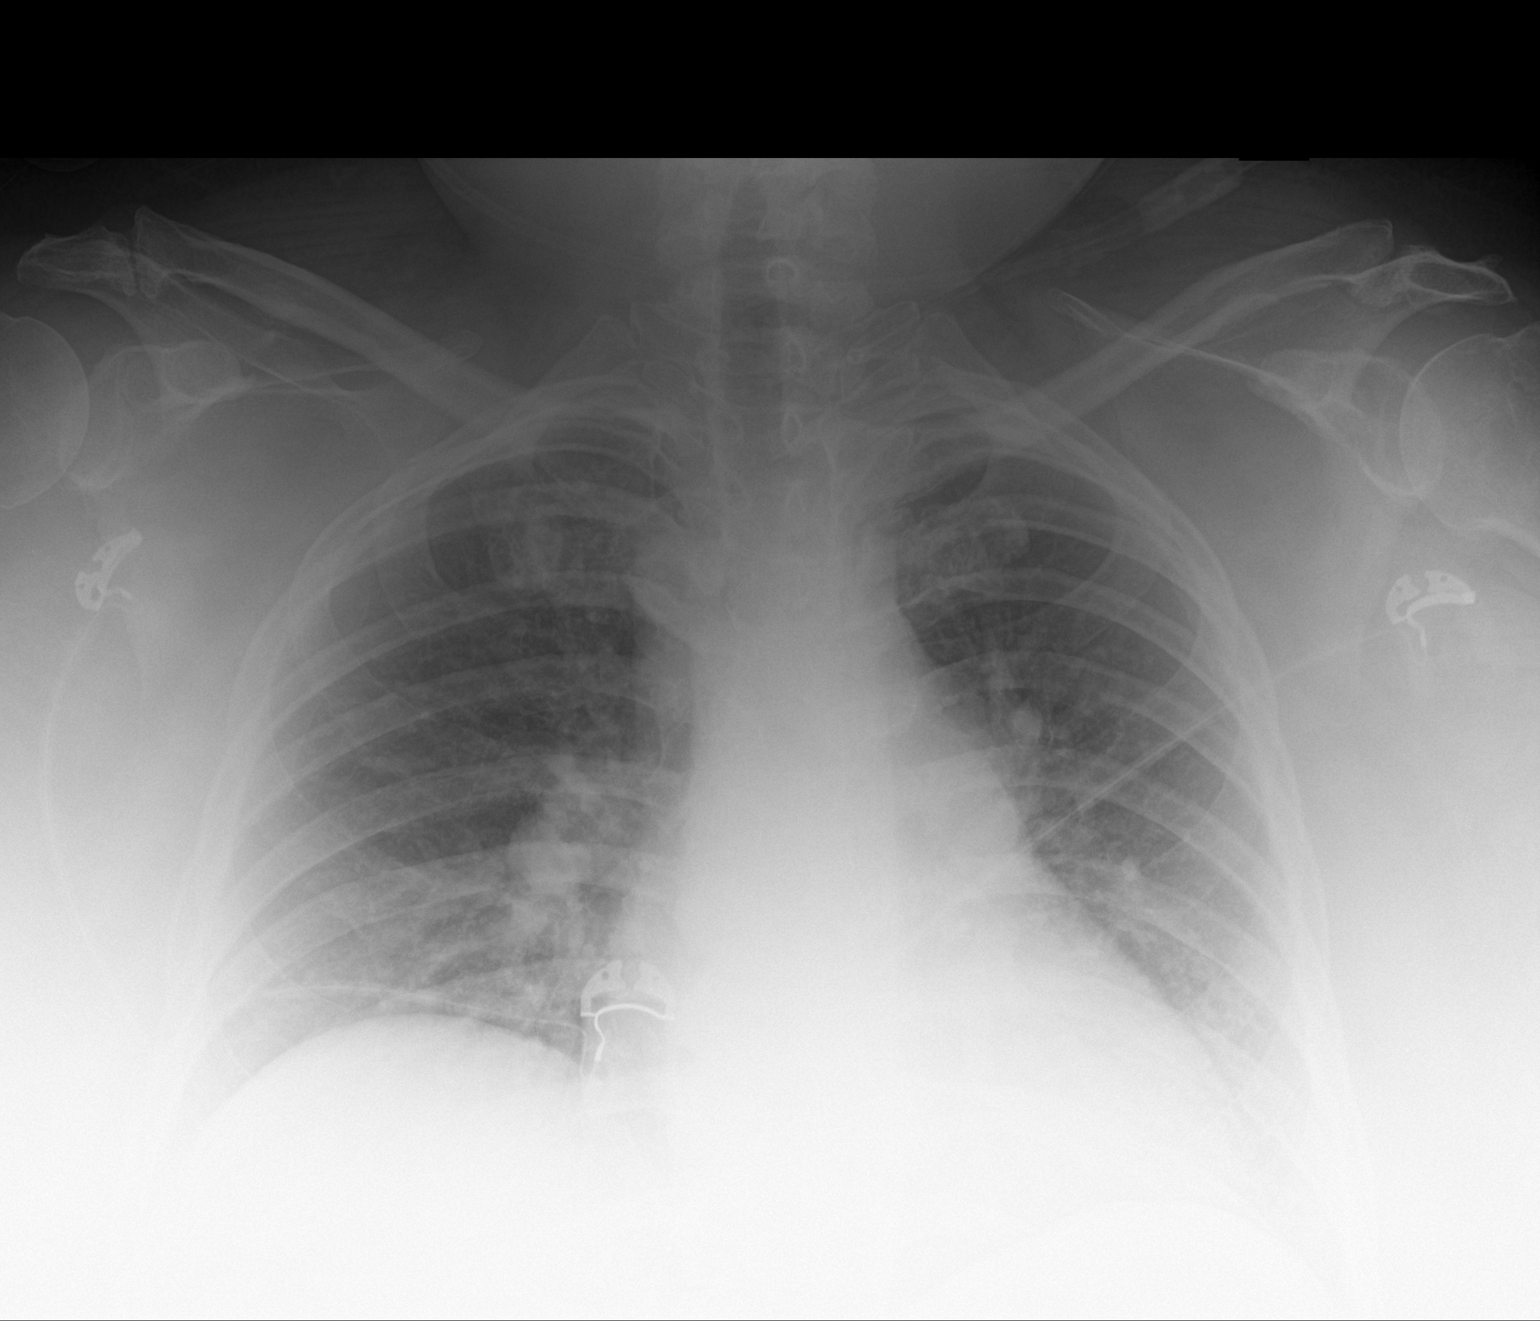

[1 of 1 positions shown; findings below may reference images not displayed]

FINDINGS: Borderline cardiomegaly. Unchanged mediastinal contours. No acute
airspace disease. Left basilar assessment is limited by soft tissue
attenuation. No significant pleural effusion. No pneumothorax.
Stable osseous structures
IMPRESSION: Borderline cardiomegaly. No acute pulmonary process.

## 2022-05-24 MED ORDER — LOSARTAN POTASSIUM 50 MG PO TABS
50.0000 mg | ORAL_TABLET | Freq: Every day | ORAL | Status: DC
  Administered 2022-05-24 – 2022-05-25 (×2): 50 mg via ORAL
  Filled 2022-05-24 (×2): qty 1

## 2022-05-24 MED ORDER — INSULIN ASPART 100 UNIT/ML IJ SOLN
20.0000 [IU] | Freq: Three times a day (TID) | INTRAMUSCULAR | Status: DC
Start: 2022-05-24 — End: 2022-05-25
  Administered 2022-05-24 – 2022-05-25 (×5): 20 [IU] via SUBCUTANEOUS

## 2022-05-24 NOTE — Assessment & Plan Note (Signed)
BP this morning was 180/94 and asymptomatic.  Patient reports being on Coreg in the past but discontinued it on her own. -Start losartan '50mg'$  daily -Monitor BP with routine vitals

## 2022-05-24 NOTE — Progress Notes (Signed)
Daily Progress Note Intern Pager: [phone]  Patient name: [name] Medical record number: [id number] Date of birth: October 04, 1976 Age: 46 y.o. Gender: female  Primary Care Provider: [name], MD Consultants: Podiatry, ID (s/o), Ortho (s/o) Code Status: Full  Pt Overview and Major Events to Date:  6/9: Admitted for HHS, Endo tool started  6/10: Off Endo tool, transition to subcu insulin 6/16: Revision of R right ray amputation   Assessment and Plan:  [name] is a 46 year old female who was initially admitted with HHS and is now resolved.. Pertinent PMH/PSH includes uncontrolled T2DM, left AKA, CKD stage III, OSA on BiPAP, bipolar disorder, COPD   * Osteomyelitis of foot, right, acute ([name]) POD #3 s/p revision of R first ray amputation. Receiving minimal prn pain meds. -Podiatry following, appreciate their expertise -Continue Augmentin (day 3/7)  -f/u surgical pathology to assess margins -Nonweight bearing on right foot -Tylenol '650mg'$  q6h prn -Percocet 7.5-325 q6h prn  Gout of knee, Right Improving -Colchicine 0.'3mg'$  daily -Cold compresses -As needed home Percocet  Type 2 diabetes mellitus with hyperosmolar hyperglycemic state (HHS) ([place]) HHS resolved. Appropriate glycemic control (CBG 64-204) over past 24 hours. -Continue Semglee 50 units BID -Reduce Novolog 20units TID with meals -Continue resistant SSI -Carb modified diet -CBG monitoring qAC and qHS  History of Candida glabrata infection, right foot stump infection Continue IV Micafungin 150 mg daily for now. Will discuss with ID on duration.  Diastolic HF (heart failure) ([name]) Euvolemic. -Holding home torsemide due to recent AKI -Likely resume torsemide tomorrow if kidney function remains stable -Daily weights, strict I's and O's  Hyperkalemia K 5.2 this morning. -Lokelma 10g x1 -Daily BMP  Hypertension BP this morning was 180/94 and asymptomatic.  Patient reports being on Coreg in the  past but discontinued it on her own. -Start losartan '50mg'$  daily -Monitor BP with routine vitals   FEN/GI: Carb modified diet PPx: Lovenox Dispo: CIR in 2-3 days. Barriers include insurance authorization.   Subjective:  [name] says she is feeling fine this morning and her right knee pain is much improved.  No complaints at this time.  Objective: Temp:  [97.8 F (36.6 C)-98.3 F (36.8 C)] 98.1 F (36.7 C) (06/19 0727) Pulse Rate:  [96-103] 96 (06/19 0727) Resp:  [20-22] 20 (06/18 1933) BP: (161-180)/(82-101) 180/94 (06/19 0727) SpO2:  [97 %-100 %] 98 % (06/19 0727) Weight:  [158.2 kg] 158.2 kg (06/19 0500) Physical Exam: General: Alert, well appearing, NAD CV: RRR, no murmurs, normal S1/S2 Pulm: CTAB, good WOB on RA, no crackles or wheezing Abd: Soft, no distension, no tenderness Ext: Left BKA, dressing of right foot C/D/I.  Right knee with minimal erythema   Laboratory: Most recent CBC Lab Results  Component Value Date   WBC 10.9 (H) 05/24/2022   HGB 9.5 (L) 05/24/2022   HCT 30.5 (L) 05/24/2022   MCV 94.4 05/24/2022   PLT 443 (H) 05/24/2022   Most recent BMP    Latest Ref Rng & Units 05/24/2022    3:28 AM  BMP  Glucose 70 - 99 mg/dL 164   BUN 6 - 20 mg/dL 34   Creatinine 0.44 - 1.00 mg/dL 1.28   Sodium 135 - 145 mmol/L 139   Potassium 3.5 - 5.1 mmol/L 4.8   Chloride 98 - 111 mmol/L 106   CO2 22 - 32 mmol/L 26   Calcium 8.9 - 10.3 mg/dL 8.8      Imaging/Diagnostic Tests: No new Images   Adah Salvage,  Jenny Reichmann, MD 05/24/2022, 2:21 PM  PGY-1, [address] Intern pager: [phone], text pages welcome Secure chat group [place]

## 2022-05-24 NOTE — Progress Notes (Addendum)
Physical Therapy Treatment Patient Details Name: [name] MRN: [id number] DOB: 08/03/1976 Today's Date: 05/24/2022   History of Present Illness Pt is a 46 y.o. female who presented  05/14/22 with hypergylcemic event with CBG over 600 for the last 3 days, also endorses chest pain. +cellulitis rt foot with nonhealing diabetic foot ulcer. MRI showing increased soft tissue ulceration medially in the forefoot abutting the 1st metatarsal remnant; suspicious for osteomyelitis. Pt now s/p R partial first ray amputation with primary closure. PMH: type 2 diabetes, morbid obesity, obesity hypoventilation syndrome, OSA wears BiPAP at night, hypertension, asthma, GERD, hyperlipidemia, restless leg syndrome, left BKA, R transmetatarsal amputation, mood disorder, hypertension, history of chest pain    PT Comments    Pt seated edge of bed on arrival.  She was eager to participate and required +2 mod assistance for safety to move from bed to recliner and back to bed.  Pt tolerated well and she was able to advance to sit to stand trials with sara stedy this session.  She had difficulty maintaining R NWB during transition from chair height to standing but during higher sit to stand transitions she maintained well.  Pt with poor balance in standing.   Will continue to recommend aggressive rehab in a post acute setting to maximize functional gains before returning home.     Recommendations for follow up therapy are one component of a multi-disciplinary discharge planning process, led by the attending physician.  Recommendations may be updated based on patient status, additional functional criteria and insurance authorization.  Follow Up Recommendations  Acute inpatient rehab (3hours/day)     Assistance Recommended at Discharge Intermittent Supervision/Assistance  Patient can return home with the following A lot of help with walking and/or transfers;A lot of help with bathing/dressing/bathroom;Assistance with  cooking/housework;Assist for transportation;Help with stairs or ramp for entrance   Equipment Recommendations  Other (comment)    Recommendations for Other Services Rehab consult     Precautions / Restrictions Precautions Precautions: Other (comment);Fall Precaution Comments: hx of L BKA, prosthesis in room Restrictions Weight Bearing Restrictions: Yes RLE Weight Bearing: Non weight bearing Other Position/Activity Restrictions: LLE prosthetic in room     Mobility  Bed Mobility Overal bed mobility: Needs Assistance Bed Mobility: Supine to Sit, Sit to Supine Rolling: Min guard   Supine to sit: Min assist Sit to supine: Min assist   General bed mobility comments: seated on EOB, Pt required min assistance with trunk control to move into sidelying for board placement and pad placement.    Transfers Overall transfer level: Needs assistance Equipment used: Ambulation equipment used (sara stedy x 4 and sliding board.) Transfers: Sit to/from Stand, Bed to chair/wheelchair/BSC Sit to Stand: Mod assist, +2 physical assistance (Heavy cues for R foot placement to and from seated surface to keep R foot offloaded.  She did present with TDWB at times due to fear but quickly returns to NWB.)          Lateral/Scoot Transfers: Mod assist, +2 physical assistance General transfer comment: Pt required cues for board placement weight shifting and assistance to scoot to the L and R across the board to recliner and back to bed.  Pt required max VCs with poor sitting balance with dynamic movement.  Pt then performed sit to stand in sara stedy x3 trials and during final trial transferred to and from commode where she urinated and had BM.  Pt able to maintain static stand in sara stedy with foot placed by PTA prior to  standing.  She had difficulty maintaining weight bearing on R side during transition.    Ambulation/Gait                   Stairs             Wheelchair Mobility     Modified Rankin (Stroke Patients Only)       Balance Overall balance assessment: Needs assistance Sitting-balance support: No upper extremity supported Sitting balance-Leahy Scale: Good Sitting balance - Comments: sitting on EOB upon entry     Standing balance-Leahy Scale: Poor                              Cognition Arousal/Alertness: Awake/alert Behavior During Therapy: WFL for tasks assessed/performed Overall Cognitive Status: Within Functional Limits for tasks assessed                                 General Comments: alert and eager to participate. Fearful of attempting transfers but with education and encourage she performed well.        Exercises      General Comments        Pertinent Vitals/Pain Pain Assessment Pain Assessment: Faces Faces Pain Scale: Hurts little more Pain Location: RLE Pain Descriptors / Indicators: Sore, Grimacing, Discomfort Pain Intervention(s): Monitored during session, Repositioned    Home Living                          Prior Function            PT Goals (current goals can now be found in the care plan section) Acute Rehab PT Goals Patient Stated Goal: home Potential to Achieve Goals: Good Progress towards PT goals: Progressing toward goals    Frequency    Min 3X/week      PT Plan Current plan remains appropriate    Co-evaluation              AM-PAC PT "6 Clicks" Mobility   Outcome Measure  Help needed turning from your back to your side while in a flat bed without using bedrails?: A Little Help needed moving from lying on your back to sitting on the side of a flat bed without using bedrails?: A Little Help needed moving to and from a bed to a chair (including a wheelchair)?: A Lot Help needed standing up from a chair using your arms (e.g., wheelchair or bedside chair)?: A Lot Help needed to walk in hospital room?: Total Help needed climbing 3-5 steps with a railing? :  Total 6 Click Score: 12    End of Session Equipment Utilized During Treatment: Oxygen Activity Tolerance: Patient tolerated treatment well Patient left: in bed;with call bell/phone within reach;Other (comment) Nurse Communication: Mobility status PT Visit Diagnosis: Muscle weakness (generalized) (M62.81);Pain Pain - Right/Left: Right Pain - part of body: Leg     Time: 9357-0177 PT Time Calculation (min) (ACUTE ONLY): 30 min  Charges:  $Therapeutic Activity: 23-37 mins                     Jaivon Vanbeek R. , PTA Acute Rehabilitation Services     Shaylynne Lunt Eli Hose 05/24/2022, 3:22 PM

## 2022-05-24 NOTE — Progress Notes (Signed)
Inpatient Rehabilitation Admissions Coordinator   I met with patient at bedside. I encouraged her to attempt transfers with therapy to demonstrated tolerance to do more intensive therapies so that I can pursue possible Cir admit. Discussed with Aimee, PTA. I will follow up tomorrow.  Danne Baxter, RN, MSN Rehab Admissions Coordinator [phone] 05/24/2022 2:35 PM

## 2022-05-24 NOTE — Progress Notes (Signed)
FPTS Brief Note Reviewed patient's vitals, recent notes.    At this time, no change in plan from day progress note.   Evren Shankland, DO Page [phone] with questions about this patient.  

## 2022-05-24 NOTE — TOC Progression Note (Signed)
Transition of Care [facility]) - Progression Note    Patient Details  Name: [name] MRN: [id number] Date of Birth: 01/05/1976  Transition of Care [facility]) CM/SW [place], RN Phone Number: 05/24/2022, 11:36 AM  Clinical Narrative:    CM met with the patient at the bedside to discuss transitions of care.  The patient is S/P right foot osteomyelitis and Post-Op Day 3 for partial 1st ray amputation with primary closure - no wound vac present and patient with history of LLE AKA.  The patient is waiting to PT/OT evaluation post surgery and is agreeable to Inpatient rehabilitation but refuses SNf placement for therapy since she is unable to forfeit her check to the facility for payment.  The patient states that she currently lives with a friend and her daughter at the home and receives home health services through [facility] at 40 hrs/per week personal care services.  The patient has available DME at home including electric wheelchair, manual wheelchair, Bipap, oxygen, bedside commode, rolling walker, glucometer and Insulin pump.   CM and MSW with DTP Team will continue to follow the patient with progression with PT/OT but at this time she is only agreeable to CIR and not SNf placement for therapy services.   Expected Discharge Plan: IP Rehab Facility Barriers to Discharge: Continued Medical Work up  Expected Discharge Plan and Services Expected Discharge Plan: [place] In-house Referral: PCP / Psychologist, educational, Clinical Social Work Discharge Planning Services: CM Consult, Follow-up appt scheduled Post Acute Care Choice: IP Rehab Living arrangements for the past 2 months: Apartment                                       Social Determinants of Health (SDOH) Interventions    Readmission Risk Interventions    05/24/2022   11:33 AM  Readmission Risk Prevention Plan  Transportation Screening Complete  Medication Review (RN Care Manager) Complete   PCP or Specialist appointment within 3-5 days of discharge Complete  HRI or [place] Complete  SW Recovery Care/Counseling Consult Complete  Palliative Care Screening Not [place] Patient Refused

## 2022-05-24 NOTE — Progress Notes (Signed)
  Subjective:  Patient ID: Kit Brubacher, female    DOB: 05-25-1976,  MRN: [id number]  A 46 y.o. female presents with right foot osteomyelitis status post day 3 partial first ray amputation with primary closure.  Patient sleeping at bedside. Dressing appears intact and no strike through.   Objective:   Vitals:   05/23/22 1933 05/24/22 0727  BP: (!) 165/82 (!) 180/94  Pulse: (!) 103 96  Resp: 20   Temp: 98.3 F (36.8 C) 98.1 F (36.7 C)  SpO2: 100% 98%   General AA&O x3. Normal mood and affect.  Vascular Dorsalis pedis and posterior tibial pulses 2/4 bilat. Brisk capillary refill to all digits. Pedal hair present.  Neurologic Epicritic sensation grossly intact.  Dermatologic Bandages clean dry and intact.  No calf pain noted.  Motor or sensory functions are intact.  Orthopedic: MMT 5/5 in dorsiflexion, plantarflexion, inversion, and eversion. Normal joint ROM without pain or crepitus.    Assessment & Plan:  Patient was evaluated and treated and all questions answered.  Right foot osteomyelitis status post partial first ray amputation revision of with primary closure -Patient is okay to be discharged from podiatric standpoint.  She can be discharged on p.o. doxycycline will extend based on cultures. -Nonweightbearing to the right foot -She will follow-up in our clinic 1 week from discharge -No dressing change until follow-up. -No further surgical plans  Lorenda Peck, DPM  Accessible via secure chat for questions or concerns.

## 2022-05-24 NOTE — Progress Notes (Signed)
Inpatient Rehabilitation Admissions Coordinator   Patient did well with therapy this afternoon with Aimee, PTA. She would benefit from a CIR admit before discharge home. I await bed availability to admit.  Danne Baxter, RN, MSN Rehab Admissions Coordinator [phone] 05/24/2022 3:48 PM

## 2022-05-24 NOTE — Progress Notes (Signed)
FPTS Brief Note Reviewed patient's vitals, recent notes. Called nurse to repeat BP with manual cuff, BP normotensive now.  Vitals:   05/24/22 0727 05/24/22 2127  BP: (!) 180/94 135/70  Pulse: 96 (!) 103  Resp:  18  Temp: 98.1 F (36.7 C) 98.4 F (36.9 C)  SpO2: 98% 98%   At this time, no change in plan from day progress note.   Donney Dice, DO Page [phone] with questions about this patient.

## 2022-05-24 NOTE — Progress Notes (Signed)
Occupational Therapy Treatment Patient Details Name: [name] MRN: [id number] DOB: [birth year] Today's Date: 05/24/2022   History of present illness Pt is a 46 y.o. female who presented  05/14/22 with hypergylcemic event with CBG over 600 for the last 3 days, also endorses chest pain. +cellulitis rt foot with nonhealing diabetic foot ulcer. MRI showing increased soft tissue ulceration medially in the forefoot abutting the 1st metatarsal remnant; suspicious for osteomyelitis. Pt now s/p R partial first ray amputation with primary closure. PMH: type 2 diabetes, morbid obesity, obesity hypoventilation syndrome, OSA wears BiPAP at night, hypertension, asthma, GERD, hyperlipidemia, restless leg syndrome, left BKA, R transmetatarsal amputation, mood disorder, hypertension, history of chest pain   OT comments  Patient sitting on EOB upon entry. Patient states she has her left prosthesis but is still nervous regarding transfers. Patient was max assist to donn and performed lateral scooting on EOB to simulate slide board transfers with min assist. Discussed home environment and the need to improve transfers due to patient will need to be able to transfer to wheelchair and then to bed. Patient states she will purchase slide board and will attempt next visit. Reviewed and performed BUE HEP with therapy bands. Acute OT to continue to follow.    Recommendations for follow up therapy are one component of a multi-disciplinary discharge planning process, led by the attending physician.  Recommendations may be updated based on patient status, additional functional criteria and insurance authorization.    Follow Up Recommendations  Acute inpatient rehab (3hours/day)    Assistance Recommended at Discharge Frequent or constant Supervision/Assistance  Patient can return home with the following  A lot of help with bathing/dressing/bathroom;Assistance with cooking/housework;Direct supervision/assist for medications  management;Direct supervision/assist for financial management;Assist for transportation;Help with stairs or ramp for entrance;Two people to help with walking and/or transfers   Equipment Recommendations  None recommended by OT;Other (comment)    Recommendations for Other Services      Precautions / Restrictions Precautions Precautions: Other (comment);Fall Precaution Comments: hx of L BKA, prosthesis in room Restrictions Weight Bearing Restrictions: Yes RLE Weight Bearing: Non weight bearing Other Position/Activity Restrictions: LLE prosthetic in room       Mobility Bed Mobility Overal bed mobility: Needs Assistance             General bed mobility comments: seated on EOB    Transfers Overall transfer level: Needs assistance                Lateral/Scoot Transfers: Min assist General transfer comment: Patient declined getting into recliner. Patient now has left prosthesis but is not comfortable attempting transfer.  Performed lateral scooting on EOB with min assist     Balance Overall balance assessment: Needs assistance Sitting-balance support: No upper extremity supported Sitting balance-Leahy Scale: Good Sitting balance - Comments: sitting on EOB upon entry                                   ADL either performed or assessed with clinical judgement   ADL Overall ADL's : Needs assistance/impaired                     Lower Body Dressing: Maximal assistance;Bed level Lower Body Dressing Details (indicate cue type and reason): to donn left prosthesis               General ADL Comments: Donned left prosthesis seated onEOB  Extremity/Trunk Assessment              Vision       Perception     Praxis      Cognition Arousal/Alertness: Awake/alert Behavior During Therapy: WFL for tasks assessed/performed Overall Cognitive Status: Impaired/Different from baseline Area of Impairment: Attention, Following commands, Memory,  Safety/judgement, Awareness, Problem solving                   Current Attention Level: Selective Memory: Decreased short-term memory, Decreased recall of precautions Following Commands: Follows one step commands with increased time, Follows one step commands inconsistently Safety/Judgement: Decreased awareness of deficits, Decreased awareness of safety Awareness: Intellectual Problem Solving: Slow processing, Decreased initiation, Difficulty sequencing, Requires verbal cues, Requires tactile cues General Comments: alert and eager to participate. Fearful of attempting transfers        Exercises Exercises: General Upper Extremity General Exercises - Upper Extremity Shoulder Flexion: Strengthening, Both, 10 reps, Seated, Theraband Theraband Level (Shoulder Flexion): Level 1 (Yellow) Shoulder ABduction: Strengthening, Both, 10 reps, Seated, Theraband Theraband Level (Shoulder Abduction): Level 1 (Yellow) Shoulder ADduction: Strengthening, Both, 10 reps, Seated, Theraband Theraband Level (Shoulder Adduction): Level 1 (Yellow)    Shoulder Instructions       General Comments      Pertinent Vitals/ Pain       Pain Assessment Pain Assessment: Faces Faces Pain Scale: Hurts little more Pain Location: RLE Pain Descriptors / Indicators: Sore, Grimacing, Discomfort Pain Intervention(s): Limited activity within patient's tolerance, Monitored during session, Repositioned  Home Living                                          Prior Functioning/Environment              Frequency  Min 3X/week        Progress Toward Goals  OT Goals(current goals can now be found in the care plan section)  Progress towards OT goals: Progressing toward goals  Acute Rehab OT Goals Patient Stated Goal: get better OT Goal Formulation: With patient Time For Goal Achievement: 05/31/22 Potential to Achieve Goals: Fair ADL Goals Pt Will Perform Grooming: Independently;bed  level Pt Will Perform Upper Body Bathing: with supervision;sitting;bed level Pt Will Transfer to Toilet: bedside commode;with supervision Additional ADL Goal #1: pt will complete bed mobility with mod I in prep for ADLs  Plan Discharge plan remains appropriate    Co-evaluation                 AM-PAC OT "6 Clicks" Daily Activity     Outcome Measure   Help from another person eating meals?: None Help from another person taking care of personal grooming?: A Little Help from another person toileting, which includes using toliet, bedpan, or urinal?: A Lot Help from another person bathing (including washing, rinsing, drying)?: A Lot Help from another person to put on and taking off regular upper body clothing?: A Little Help from another person to put on and taking off regular lower body clothing?: A Lot 6 Click Score: 16    End of Session    OT Visit Diagnosis: Unsteadiness on feet (R26.81);Other abnormalities of gait and mobility (R26.89);Muscle weakness (generalized) (M62.81)   Activity Tolerance Patient tolerated treatment well   Patient Left in bed;with call bell/phone within reach   Nurse Communication Mobility status        Time: 0102-7253  OT Time Calculation (min): 41 min  Charges: OT General Charges $OT Visit: 1 Visit OT Treatments $Self Care/Home Management : 8-22 mins $Therapeutic Activity: 8-22 mins $Therapeutic Exercise: 8-22 mins  Lodema Hong, OTA Acute Rehabilitation Services  Office [place] 05/24/2022, 1:51 PM

## 2022-05-25 ENCOUNTER — Encounter (HOSPITAL_COMMUNITY): Payer: Self-pay | Admitting: Physical Medicine and Rehabilitation

## 2022-05-25 ENCOUNTER — Inpatient Hospital Stay (HOSPITAL_COMMUNITY)
Admission: RE | Admit: 2022-05-25 | Discharge: 2022-06-04 | DRG: 559 | Disposition: A | Discharge status: Home Health | Payer: Medicaid Other | Source: Intra-hospital | Attending: Physical Medicine and Rehabilitation | Admitting: Physical Medicine and Rehabilitation

## 2022-05-25 ENCOUNTER — Other Ambulatory Visit: Payer: Self-pay

## 2022-05-25 DIAGNOSIS — K5900 Constipation, unspecified: Secondary | ICD-10-CM | POA: Diagnosis not present

## 2022-05-25 DIAGNOSIS — I5032 Chronic diastolic (congestive) heart failure: Secondary | ICD-10-CM

## 2022-05-25 DIAGNOSIS — Z87891 Personal history of nicotine dependence: Secondary | ICD-10-CM

## 2022-05-25 DIAGNOSIS — Z8616 Personal history of COVID-19: Secondary | ICD-10-CM | POA: Diagnosis not present

## 2022-05-25 DIAGNOSIS — N1832 Chronic kidney disease, stage 3b: Secondary | ICD-10-CM | POA: Diagnosis present

## 2022-05-25 DIAGNOSIS — Z888 Allergy status to other drugs, medicaments and biological substances status: Secondary | ICD-10-CM

## 2022-05-25 DIAGNOSIS — G8929 Other chronic pain: Secondary | ICD-10-CM | POA: Diagnosis present

## 2022-05-25 DIAGNOSIS — E1169 Type 2 diabetes mellitus with other specified complication: Secondary | ICD-10-CM

## 2022-05-25 DIAGNOSIS — I2510 Atherosclerotic heart disease of native coronary artery without angina pectoris: Secondary | ICD-10-CM | POA: Diagnosis present

## 2022-05-25 DIAGNOSIS — Z4781 Encounter for orthopedic aftercare following surgical amputation: Secondary | ICD-10-CM | POA: Diagnosis present

## 2022-05-25 DIAGNOSIS — M1990 Unspecified osteoarthritis, unspecified site: Secondary | ICD-10-CM | POA: Diagnosis present

## 2022-05-25 DIAGNOSIS — Z7985 Long-term (current) use of injectable non-insulin antidiabetic drugs: Secondary | ICD-10-CM

## 2022-05-25 DIAGNOSIS — F419 Anxiety disorder, unspecified: Secondary | ICD-10-CM | POA: Diagnosis not present

## 2022-05-25 DIAGNOSIS — Z91018 Allergy to other foods: Secondary | ICD-10-CM

## 2022-05-25 DIAGNOSIS — Z7951 Long term (current) use of inhaled steroids: Secondary | ICD-10-CM

## 2022-05-25 DIAGNOSIS — Z8249 Family history of ischemic heart disease and other diseases of the circulatory system: Secondary | ICD-10-CM

## 2022-05-25 DIAGNOSIS — E1122 Type 2 diabetes mellitus with diabetic chronic kidney disease: Secondary | ICD-10-CM | POA: Diagnosis present

## 2022-05-25 DIAGNOSIS — Z9989 Dependence on other enabling machines and devices: Secondary | ICD-10-CM

## 2022-05-25 DIAGNOSIS — D649 Anemia, unspecified: Secondary | ICD-10-CM

## 2022-05-25 DIAGNOSIS — E1100 Type 2 diabetes mellitus with hyperosmolarity without nonketotic hyperglycemic-hyperosmolar coma (NKHHC): Secondary | ICD-10-CM | POA: Diagnosis not present

## 2022-05-25 DIAGNOSIS — Z794 Long term (current) use of insulin: Secondary | ICD-10-CM

## 2022-05-25 DIAGNOSIS — D72829 Elevated white blood cell count, unspecified: Secondary | ICD-10-CM | POA: Diagnosis not present

## 2022-05-25 DIAGNOSIS — I130 Hypertensive heart and chronic kidney disease with heart failure and stage 1 through stage 4 chronic kidney disease, or unspecified chronic kidney disease: Secondary | ICD-10-CM | POA: Diagnosis present

## 2022-05-25 DIAGNOSIS — Z825 Family history of asthma and other chronic lower respiratory diseases: Secondary | ICD-10-CM

## 2022-05-25 DIAGNOSIS — K219 Gastro-esophageal reflux disease without esophagitis: Secondary | ICD-10-CM | POA: Diagnosis present

## 2022-05-25 DIAGNOSIS — Z89431 Acquired absence of right foot: Secondary | ICD-10-CM

## 2022-05-25 DIAGNOSIS — Z7982 Long term (current) use of aspirin: Secondary | ICD-10-CM

## 2022-05-25 DIAGNOSIS — Z6841 Body Mass Index (BMI) 40.0 and over, adult: Secondary | ICD-10-CM | POA: Diagnosis not present

## 2022-05-25 DIAGNOSIS — E785 Hyperlipidemia, unspecified: Secondary | ICD-10-CM | POA: Diagnosis present

## 2022-05-25 DIAGNOSIS — F3181 Bipolar II disorder: Secondary | ICD-10-CM | POA: Diagnosis present

## 2022-05-25 DIAGNOSIS — I5033 Acute on chronic diastolic (congestive) heart failure: Secondary | ICD-10-CM | POA: Diagnosis not present

## 2022-05-25 DIAGNOSIS — J449 Chronic obstructive pulmonary disease, unspecified: Secondary | ICD-10-CM | POA: Diagnosis present

## 2022-05-25 DIAGNOSIS — N179 Acute kidney failure, unspecified: Secondary | ICD-10-CM | POA: Diagnosis present

## 2022-05-25 DIAGNOSIS — E1142 Type 2 diabetes mellitus with diabetic polyneuropathy: Secondary | ICD-10-CM | POA: Diagnosis present

## 2022-05-25 DIAGNOSIS — Z89512 Acquired absence of left leg below knee: Secondary | ICD-10-CM | POA: Diagnosis not present

## 2022-05-25 DIAGNOSIS — Z79899 Other long term (current) drug therapy: Secondary | ICD-10-CM

## 2022-05-25 DIAGNOSIS — I5030 Unspecified diastolic (congestive) heart failure: Secondary | ICD-10-CM | POA: Diagnosis not present

## 2022-05-25 DIAGNOSIS — Z83438 Family history of other disorder of lipoprotein metabolism and other lipidemia: Secondary | ICD-10-CM

## 2022-05-25 DIAGNOSIS — E875 Hyperkalemia: Secondary | ICD-10-CM | POA: Diagnosis not present

## 2022-05-25 DIAGNOSIS — E8770 Fluid overload, unspecified: Secondary | ICD-10-CM

## 2022-05-25 DIAGNOSIS — M86171 Other acute osteomyelitis, right ankle and foot: Secondary | ICD-10-CM | POA: Diagnosis not present

## 2022-05-25 DIAGNOSIS — M109 Gout, unspecified: Secondary | ICD-10-CM | POA: Diagnosis present

## 2022-05-25 DIAGNOSIS — I10 Essential (primary) hypertension: Secondary | ICD-10-CM | POA: Diagnosis not present

## 2022-05-25 DIAGNOSIS — Z800 Family history of malignant neoplasm of digestive organs: Secondary | ICD-10-CM

## 2022-05-25 DIAGNOSIS — G4700 Insomnia, unspecified: Secondary | ICD-10-CM | POA: Diagnosis present

## 2022-05-25 DIAGNOSIS — G4733 Obstructive sleep apnea (adult) (pediatric): Secondary | ICD-10-CM | POA: Diagnosis present

## 2022-05-25 DIAGNOSIS — G2581 Restless legs syndrome: Secondary | ICD-10-CM | POA: Diagnosis present

## 2022-05-25 DIAGNOSIS — Z885 Allergy status to narcotic agent status: Secondary | ICD-10-CM

## 2022-05-25 DIAGNOSIS — Z89612 Acquired absence of left leg above knee: Secondary | ICD-10-CM | POA: Diagnosis not present

## 2022-05-25 DIAGNOSIS — Z818 Family history of other mental and behavioral disorders: Secondary | ICD-10-CM

## 2022-05-25 DIAGNOSIS — Z833 Family history of diabetes mellitus: Secondary | ICD-10-CM

## 2022-05-25 DIAGNOSIS — N183 Chronic kidney disease, stage 3 unspecified: Secondary | ICD-10-CM

## 2022-05-25 DIAGNOSIS — F0630 Mood disorder due to known physiological condition, unspecified: Secondary | ICD-10-CM

## 2022-05-25 LAB — CBC
HCT: 31.2 % — ABNORMAL LOW (ref 36.0–46.0)
Hemoglobin: 9.7 g/dL — ABNORMAL LOW (ref 12.0–15.0)
MCH: 29.2 pg (ref 26.0–34.0)
MCHC: 31.1 g/dL (ref 30.0–36.0)
MCV: 94 fL (ref 80.0–100.0)
Platelets: 415 10*3/uL — ABNORMAL HIGH (ref 150–400)
RBC: 3.32 MIL/uL — ABNORMAL LOW (ref 3.87–5.11)
RDW: 14.7 % (ref 11.5–15.5)
WBC: 13.9 10*3/uL — ABNORMAL HIGH (ref 4.0–10.5)
nRBC: 0.1 % (ref 0.0–0.2)

## 2022-05-25 LAB — BASIC METABOLIC PANEL
Anion gap: 8 (ref 5–15)
BUN: 32 mg/dL — ABNORMAL HIGH (ref 6–20)
CO2: 27 mmol/L (ref 22–32)
Calcium: 9 mg/dL (ref 8.9–10.3)
Chloride: 105 mmol/L (ref 98–111)
Creatinine, Ser: 1.53 mg/dL — ABNORMAL HIGH (ref 0.44–1.00)
GFR, Estimated: 43 mL/min — ABNORMAL LOW (ref >60)
Glucose, Bld: 111 mg/dL — ABNORMAL HIGH (ref 70–99)
Potassium: 4.5 mmol/L (ref 3.5–5.1)
Sodium: 140 mmol/L (ref 135–145)

## 2022-05-25 LAB — GLUCOSE, CAPILLARY
Glucose-Capillary: 88 mg/dL (ref 70–99)
Glucose-Capillary: 108 mg/dL — ABNORMAL HIGH (ref 70–99)
Glucose-Capillary: 178 mg/dL — ABNORMAL HIGH (ref 70–99)
Glucose-Capillary: 185 mg/dL — ABNORMAL HIGH (ref 70–99)
Glucose-Capillary: 191 mg/dL — ABNORMAL HIGH (ref 70–99)

## 2022-05-25 MED ORDER — ENOXAPARIN SODIUM 80 MG/0.8ML IJ SOSY
80.0000 mg | PREFILLED_SYRINGE | INTRAMUSCULAR | Status: DC
  Administered 2022-05-26 – 2022-06-04 (×10): 80 mg via SUBCUTANEOUS
  Filled 2022-05-25 (×10): qty 0.8

## 2022-05-25 MED ORDER — GABAPENTIN 300 MG PO CAPS
900.0000 mg | ORAL_CAPSULE | Freq: Three times a day (TID) | ORAL | Status: DC
  Administered 2022-05-25: 900 mg via ORAL
  Filled 2022-05-25: qty 3

## 2022-05-25 MED ORDER — ENOXAPARIN SODIUM 60 MG/0.6ML IJ SOSY: 60.0000 mg | PREFILLED_SYRINGE | INTRAMUSCULAR | Status: DC

## 2022-05-25 MED ORDER — MOMETASONE FURO-FORMOTEROL FUM 200-5 MCG/ACT IN AERO
2.0000 | INHALATION_SPRAY | Freq: Two times a day (BID) | RESPIRATORY_TRACT | Status: DC
  Administered 2022-05-27 – 2022-06-04 (×13): 2 via RESPIRATORY_TRACT
  Filled 2022-05-25: qty 8.8

## 2022-05-25 MED ORDER — DIPHENHYDRAMINE HCL 12.5 MG/5ML PO ELIX: 12.5000 mg | ORAL_SOLUTION | Freq: Four times a day (QID) | ORAL | Status: DC | PRN

## 2022-05-25 MED ORDER — ACETAMINOPHEN 325 MG PO TABS: 650.0000 mg | ORAL_TABLET | Freq: Four times a day (QID) | ORAL | 0 refills | Status: DC | PRN

## 2022-05-25 MED ORDER — COLCHICINE 0.3 MG HALF TABLET
0.3000 mg | ORAL_TABLET | Freq: Every day | ORAL | Status: DC
  Administered 2022-05-26 – 2022-06-01 (×7): 0.3 mg via ORAL
  Filled 2022-05-25 (×7): qty 1

## 2022-05-25 MED ORDER — NOVOLOG FLEXPEN 100 UNIT/ML ~~LOC~~ SOPN: 20.0000 [IU] | PEN_INJECTOR | Freq: Three times a day (TID) | SUBCUTANEOUS | 3 refills | Status: DC

## 2022-05-25 MED ORDER — GABAPENTIN 600 MG PO TABS
900.0000 mg | ORAL_TABLET | Freq: Three times a day (TID) | ORAL | Status: DC
Start: 2022-05-26 — End: 2022-05-28
  Administered 2022-05-26 – 2022-05-28 (×7): 900 mg via ORAL
  Filled 2022-05-25 (×9): qty 1.5

## 2022-05-25 MED ORDER — RAMELTEON 8 MG PO TABS: 8.0000 mg | ORAL_TABLET | Freq: Every evening | ORAL | 0 refills | Status: DC | PRN

## 2022-05-25 MED ORDER — INSULIN GLARGINE-YFGN 100 UNIT/ML ~~LOC~~ SOLN
50.0000 [IU] | Freq: Two times a day (BID) | SUBCUTANEOUS | Status: DC
Start: 2022-05-26 — End: 2022-05-29
  Administered 2022-05-26 – 2022-05-28 (×6): 50 [IU] via SUBCUTANEOUS
  Filled 2022-05-25 (×10): qty 0.5

## 2022-05-25 MED ORDER — PROCHLORPERAZINE EDISYLATE 10 MG/2ML IJ SOLN: 5.0000 mg | Freq: Four times a day (QID) | INTRAMUSCULAR | Status: DC | PRN

## 2022-05-25 MED ORDER — DEXTROSE 50 % IV SOLN: 0.0000 mL | INTRAVENOUS | Status: DC | PRN

## 2022-05-25 MED ORDER — FLEET ENEMA 7-19 GM/118ML RE ENEM
1.0000 | ENEMA | Freq: Once | RECTAL | Status: DC | PRN
Start: 2022-05-25 — End: 2022-06-04

## 2022-05-25 MED ORDER — PROCHLORPERAZINE MALEATE 5 MG PO TABS: 5.0000 mg | ORAL_TABLET | Freq: Four times a day (QID) | ORAL | Status: DC | PRN

## 2022-05-25 MED ORDER — ORAL CARE MOUTH RINSE
15.0000 mL | Freq: Two times a day (BID) | OROMUCOSAL | Status: DC
  Administered 2022-05-25 – 2022-06-04 (×14): 15 mL via OROMUCOSAL

## 2022-05-25 MED ORDER — TORSEMIDE 20 MG PO TABS: 40.0000 mg | ORAL_TABLET | Freq: Every day | ORAL | 2 refills | Status: DC | PRN

## 2022-05-25 MED ORDER — GABAPENTIN 600 MG PO TABS: 900.0000 mg | ORAL_TABLET | Freq: Three times a day (TID) | ORAL | 0 refills | Status: DC

## 2022-05-25 MED ORDER — DULOXETINE HCL 30 MG PO CPEP
60.0000 mg | ORAL_CAPSULE | Freq: Every day | ORAL | Status: DC
  Administered 2022-05-26 – 2022-06-04 (×10): 60 mg via ORAL
  Filled 2022-05-25 (×10): qty 2

## 2022-05-25 MED ORDER — PROCHLORPERAZINE 25 MG RE SUPP: 12.5000 mg | Freq: Four times a day (QID) | RECTAL | Status: DC | PRN

## 2022-05-25 MED ORDER — LOSARTAN POTASSIUM 50 MG PO TABS
50.0000 mg | ORAL_TABLET | Freq: Every day | ORAL | Status: DC
  Administered 2022-05-26 – 2022-05-31 (×6): 50 mg via ORAL
  Filled 2022-05-25 (×6): qty 1

## 2022-05-25 MED ORDER — ALUM & MAG HYDROXIDE-SIMETH 200-200-20 MG/5ML PO SUSP: 30.0000 mL | ORAL | Status: DC | PRN

## 2022-05-25 MED ORDER — BISACODYL 10 MG RE SUPP: 10.0000 mg | Freq: Every day | RECTAL | Status: DC | PRN

## 2022-05-25 MED ORDER — POTASSIUM CHLORIDE CRYS ER 20 MEQ PO TBCR: EXTENDED_RELEASE_TABLET | ORAL | 6 refills | Status: DC

## 2022-05-25 MED ORDER — INSULIN ASPART 100 UNIT/ML IJ SOLN
0.0000 [IU] | Freq: Every day | INTRAMUSCULAR | Status: DC
  Administered 2022-05-27: 4 [IU] via SUBCUTANEOUS
  Administered 2022-05-29: 2 [IU] via SUBCUTANEOUS
  Administered 2022-05-31: 4 [IU] via SUBCUTANEOUS
  Administered 2022-06-01 – 2022-06-03 (×2): 2 [IU] via SUBCUTANEOUS

## 2022-05-25 MED ORDER — TRAZODONE HCL 50 MG PO TABS
25.0000 mg | ORAL_TABLET | Freq: Every evening | ORAL | Status: DC | PRN
  Administered 2022-05-26 – 2022-06-03 (×7): 50 mg via ORAL
  Filled 2022-05-25 (×7): qty 1

## 2022-05-25 MED ORDER — TRESIBA FLEXTOUCH 200 UNIT/ML ~~LOC~~ SOPN: 45.0000 [IU] | PEN_INJECTOR | Freq: Two times a day (BID) | SUBCUTANEOUS | 3 refills | Status: DC

## 2022-05-25 MED ORDER — INSULIN ASPART 100 UNIT/ML IJ SOLN
0.0000 [IU] | Freq: Three times a day (TID) | INTRAMUSCULAR | Status: DC
  Administered 2022-05-27: 7 [IU] via SUBCUTANEOUS
  Administered 2022-05-27 – 2022-05-28 (×3): 2 [IU] via SUBCUTANEOUS
  Administered 2022-05-28: 1 [IU] via SUBCUTANEOUS
  Administered 2022-05-29 (×2): 2 [IU] via SUBCUTANEOUS
  Administered 2022-05-29: 3 [IU] via SUBCUTANEOUS
  Administered 2022-05-30: 1 [IU] via SUBCUTANEOUS
  Administered 2022-05-30 (×2): 5 [IU] via SUBCUTANEOUS
  Administered 2022-05-31: 7 [IU] via SUBCUTANEOUS
  Administered 2022-05-31: 2 [IU] via SUBCUTANEOUS
  Administered 2022-06-01 (×2): 3 [IU] via SUBCUTANEOUS
  Administered 2022-06-01: 2 [IU] via SUBCUTANEOUS
  Administered 2022-06-02: 3 [IU] via SUBCUTANEOUS
  Administered 2022-06-02 (×2): 2 [IU] via SUBCUTANEOUS
  Administered 2022-06-03 (×2): 3 [IU] via SUBCUTANEOUS
  Administered 2022-06-03 – 2022-06-04 (×2): 2 [IU] via SUBCUTANEOUS

## 2022-05-25 MED ORDER — LOSARTAN POTASSIUM 50 MG PO TABS: 50.0000 mg | ORAL_TABLET | Freq: Every day | ORAL | 0 refills | Status: DC

## 2022-05-25 MED ORDER — ASPIRIN 81 MG PO TBEC
81.0000 mg | DELAYED_RELEASE_TABLET | Freq: Every day | ORAL | Status: DC
  Administered 2022-05-26 – 2022-06-04 (×10): 81 mg via ORAL
  Filled 2022-05-25 (×10): qty 1

## 2022-05-25 MED ORDER — PREDNISOLONE ACETATE 1 % OP SUSP
1.0000 [drp] | Freq: Four times a day (QID) | OPHTHALMIC | Status: DC
Start: 2022-05-26 — End: 2022-06-04
  Administered 2022-05-26 – 2022-06-04 (×29): 1 [drp] via OPHTHALMIC
  Filled 2022-05-25 (×2): qty 5

## 2022-05-25 MED ORDER — INSULIN ASPART 100 UNIT/ML IJ SOLN
20.0000 [IU] | Freq: Three times a day (TID) | INTRAMUSCULAR | Status: DC
Start: 2022-05-26 — End: 2022-06-04
  Administered 2022-05-26 – 2022-06-03 (×24): 20 [IU] via SUBCUTANEOUS

## 2022-05-25 MED ORDER — AMOXICILLIN-POT CLAVULANATE 875-125 MG PO TABS: 1.0000 | ORAL_TABLET | Freq: Two times a day (BID) | ORAL | 0 refills | Status: DC

## 2022-05-25 MED ORDER — ACETAMINOPHEN 325 MG PO TABS
325.0000 mg | ORAL_TABLET | ORAL | Status: DC | PRN
  Administered 2022-06-03: 650 mg via ORAL
  Filled 2022-05-25 (×2): qty 2

## 2022-05-25 MED ORDER — REVEFENACIN 175 MCG/3ML IN SOLN
175.0000 ug | Freq: Every day | RESPIRATORY_TRACT | Status: DC
Start: 2022-05-26 — End: 2022-06-04
  Administered 2022-05-27 – 2022-06-04 (×7): 175 ug via RESPIRATORY_TRACT
  Filled 2022-05-25 (×11): qty 3

## 2022-05-25 MED ORDER — AMOXICILLIN-POT CLAVULANATE 875-125 MG PO TABS
1.0000 | ORAL_TABLET | Freq: Two times a day (BID) | ORAL | Status: DC
  Administered 2022-05-25: 1 via ORAL
  Filled 2022-05-25: qty 1

## 2022-05-25 MED ORDER — GABAPENTIN 300 MG PO CAPS: 900.0000 mg | ORAL_CAPSULE | Freq: Three times a day (TID) | ORAL | Status: DC

## 2022-05-25 MED ORDER — POLYETHYLENE GLYCOL 3350 17 G PO PACK: 17.0000 g | PACK | Freq: Every day | ORAL | Status: DC | PRN

## 2022-05-25 MED ORDER — OXYCODONE-ACETAMINOPHEN 7.5-325 MG PO TABS
1.0000 | ORAL_TABLET | Freq: Four times a day (QID) | ORAL | Status: DC | PRN
  Administered 2022-05-25 – 2022-06-04 (×24): 1 via ORAL
  Filled 2022-05-25 (×27): qty 1

## 2022-05-25 MED ORDER — LORATADINE 10 MG PO TABS: 10.0000 mg | ORAL_TABLET | Freq: Every day | ORAL | Status: DC | PRN

## 2022-05-25 MED ORDER — ALBUTEROL SULFATE (2.5 MG/3ML) 0.083% IN NEBU: 3.0000 mL | INHALATION_SOLUTION | Freq: Four times a day (QID) | RESPIRATORY_TRACT | Status: DC | PRN

## 2022-05-25 MED ORDER — GUAIFENESIN-DM 100-10 MG/5ML PO SYRP: 5.0000 mL | ORAL_SOLUTION | Freq: Four times a day (QID) | ORAL | Status: DC | PRN

## 2022-05-25 NOTE — Progress Notes (Signed)
1820 Report attempted to [place] Report attempted  1923 Report attempted

## 2022-05-25 NOTE — Progress Notes (Addendum)
Physical Therapy Treatment Patient Details Name: [name] MRN: [id number] DOB: Jul 06, 1976 Today's Date: 05/25/2022   History of Present Illness Pt is a 46 y.o. female who presented  05/14/22 with hypergylcemic event with CBG over 600 for the last 3 days, also endorses chest pain. +cellulitis rt foot with nonhealing diabetic foot ulcer. MRI showing increased soft tissue ulceration medially in the forefoot abutting the 1st metatarsal remnant; suspicious for osteomyelitis. Pt now s/p R partial first ray amputation with primary closure and NWB. PMH: type 2 diabetes, morbid obesity, obesity hypoventilation syndrome, OSA wears BiPAP at night, hypertension, asthma, GERD, hyperlipidemia, restless leg syndrome, left BKA, R transmetatarsal amputation, mood disorder, hypertension, history of chest pain    PT Comments    Pt received seated EOB, agreeable to therapy session with excellent participation and good tolerance for transfer training OOB to wheelchair and wheelchair mobility training in hallway. Pt making good progress toward goals, able to perform lateral slide board scooting to [facility] with up to +2 modA and propelled wheelchair in hallway with minA and mod cues for technique/body mechanics and many brief seated breaks. VSS on RA. Pt continues to benefit from PT services to progress toward functional mobility goals.   Recommendations for follow up therapy are one component of a multi-disciplinary discharge planning process, led by the attending physician.  Recommendations may be updated based on patient status, additional functional criteria and insurance authorization.  Follow Up Recommendations  Acute inpatient rehab (3hours/day)     Assistance Recommended at Discharge Intermittent Supervision/Assistance  Patient can return home with the following A lot of help with bathing/dressing/bathroom;Assistance with cooking/housework;Assist for transportation;Help with stairs or ramp for entrance;Two people to  help with walking and/or transfers   Equipment Recommendations  Other (comment) (pending mobility progress)    Recommendations for Other Services       Precautions / Restrictions Precautions Precautions: Other (comment);Fall Precaution Comments: hx of L BKA, prosthesis in room Restrictions Weight Bearing Restrictions: Yes RLE Weight Bearing: Non weight bearing (pt able to verbalize this precaution but needs cues to maintain) Other Position/Activity Restrictions: LLE prosthetic in room     Mobility  Bed Mobility Overal bed mobility: Needs Assistance Bed Mobility: Sit to Sidelying, Sidelying to Sit   Sidelying to sit: Min assist     Sit to sidelying: Min guard General bed mobility comments: seated on EOB, Pt required min assistance with trunk control to move into sidelying for board placement and pad placement.    Transfers Overall transfer level: Needs assistance Equipment used: Sliding board Transfers: Bed to chair/wheelchair/BSC            Lateral/Scoot Transfers: Min assist, Mod assist, +2 physical assistance, With slide board General transfer comment: Pt required cues for board placement weight shifting and assistance to scoot to the L across the board to [facility] (with minA +2) and from [facility] toward her L side for back to bed (with modA +2 to slightly higher bed surface). Pt required max VCs with poor sitting balance with dynamic movement. Defer standing for safety due to pt poor compliance with RLE NWB restrictions during previous session.        Information systems manager mobility: Yes Wheelchair propulsion: Both upper extremities Wheelchair parts: Needs assistance Distance: 100 Wheelchair Assistance Details (indicate cue type and reason): minA at times to assist pt while propelling wheelchair and min/modA while pt turning chair to maintain pt momentum; multiple brief rest breaks 2/2 pt UE fatigue and DOE to 2/4. SpO2/HR  WFL. Mod cues for  proper grip on wheel rails, technique to propel/turn chair and for energy conservation.  Modified Rankin (Stroke Patients Only)       Balance Overall balance assessment: Needs assistance Sitting-balance support: No upper extremity supported Sitting balance-Leahy Scale: Fair Sitting balance - Comments: sitting on EOB upon entry, with lateral leans to reach pt unsteady needing up to minA for lateral leans with elbow taps to sit back upright       Standing balance comment: defer for safety, NWB RLE                            Cognition Arousal/Alertness: Awake/alert Behavior During Therapy: WFL for tasks assessed/performed Overall Cognitive Status: Within Functional Limits for tasks assessed                                 General Comments: alert and eager to participate. Fearful of attempting transfers but with education and encouragement she performed well.        Exercises      General Comments General comments (skin integrity, edema, etc.): SpO2 95% on RA and HR 97 bpm with exertion (WC mobility)      Pertinent Vitals/Pain Pain Assessment Pain Assessment: Faces Faces Pain Scale: Hurts little more Pain Location: RLE Pain Descriptors / Indicators: Sore, Grimacing, Discomfort Pain Intervention(s): Limited activity within patient's tolerance, Monitored during session, Repositioned           PT Goals (current goals can now be found in the care plan section) Acute Rehab PT Goals Patient Stated Goal: home PT Goal Formulation: With patient Time For Goal Achievement: 06/02/22 Progress towards PT goals: Progressing toward goals    Frequency    Min 3X/week      PT Plan Current plan remains appropriate       AM-PAC PT "6 Clicks" Mobility   Outcome Measure  Help needed turning from your back to your side while in a flat bed without using bedrails?: A Little Help needed moving from lying on your back to sitting on the side of a flat bed  without using bedrails?: A Little Help needed moving to and from a bed to a chair (including a wheelchair)?: A Lot Help needed standing up from a chair using your arms (e.g., wheelchair or bedside chair)?: Total Help needed to walk in hospital room?: Total Help needed climbing 3-5 steps with a railing? : Total 6 Click Score: 11    End of Session   Activity Tolerance: Patient tolerated treatment well Patient left: in bed;with call bell/phone within reach;Other (comment) (pt sitting EOB, daughter brianna present in room) Nurse Communication: Mobility status PT Visit Diagnosis: Muscle weakness (generalized) (M62.81);Pain Pain - Right/Left: Right Pain - part of body: Leg     Time: 5038-8828 PT Time Calculation (min) (ACUTE ONLY): 36 min  Charges:  $Therapeutic Activity: 8-22 mins $Wheel Chair Management: 8-22 mins                     Shamyra Farias P., PTA Acute Rehabilitation Services Secure Chat Preferred 9a-5:30pm Office: [place] 05/25/2022, 1:27 PM

## 2022-05-25 NOTE — Progress Notes (Incomplete)
Inpatient Rehabilitation Admission Medication Review by a Pharmacist  A complete drug regimen review was completed for this patient to identify any potential clinically significant medication issues.  High Risk Drug Classes Is patient taking? Indication by Medication  Antipsychotic Yes Compazine- N/V  Anticoagulant Yes Lovenox- VTE prophylaxis- 0.5 mg/kg daily dose for BMI >30  Antibiotic Yes Augmentin- osteomyelitis RT foot  End date: 05/29/2022 after AM dose  Opioid Yes Percocet- acute pain (gout)  Antiplatelet Yes Aspirin- CVA prophylaxis  Hypoglycemics/insulin Yes Insulin- T2DM  Vasoactive Medication Yes Cozaar- hypertension  Chemotherapy No   Other Yes Colchicine- gout, RT knee Cymbalta- MDD Neurontin- neuropathic pain     Type of Medication Issue Identified Description of Issue Recommendation(s)  Drug Interaction(s) (clinically significant)     Duplicate Therapy     Allergy     No Medication Administration End Date     Incorrect Dose     Additional Drug Therapy Needed     Significant med changes from prior encounter (inform family/care partners about these prior to discharge).    Other  PTA meds: Zyrtec 10 mg daily Vitamin D 1000 units daily Novolog flexpen 30 units [place] with meals and sliding scale Tresiba 30 units 2 times a day Nystatin topical powder applied to skin folds three times daily Zofran 4 mg po q8h as needed for N/V Spiriva respimat 2 puffs daily Demadex 80 mg qam and 40 mg qpm Trulicity 3 mg [place] weekly on Sundays Restart PTA meds when and if necessary during CIR admission or at time of discharge, if warranted     Clinically significant medication issues were identified that warrant physician communication and completion of prescribed/recommended actions by midnight of the next day:  No   Time spent performing this drug regimen review (minutes):  30  Raymel Cull BS, PharmD, BCPS Clinical Pharmacist 05/25/2022 1:30 PM  Contact: [phone] after  3 PM  "Be curious, not judgmental..." -Jamal Maes

## 2022-05-25 NOTE — Progress Notes (Signed)
Inpatient Rehabilitation Admission Medication Review by a Pharmacist  A complete drug regimen review was completed for this patient to identify any potential clinically significant medication issues.  High Risk Drug Classes Is patient taking? Indication by Medication  Antipsychotic Yes Compazine- N/V  Anticoagulant Yes Lovenox- VTE prophylaxis- 0.5 mg/kg daily dose for BMI >30  Antibiotic Yes Augmentin- osteomyelitis RT foot  End date: 05/29/2022 after AM dose  Opioid Yes Percocet- acute pain (gout)  Antiplatelet Yes Aspirin- CVA prophylaxis  Hypoglycemics/insulin Yes Insulin- T2DM  Vasoactive Medication Yes Cozaar- hypertension  Chemotherapy No   Other Yes Colchicine- gout, RT knee Cymbalta- MDD Neurontin- neuropathic pain     Type of Medication Issue Identified Description of Issue Recommendation(s)  Drug Interaction(s) (clinically significant)     Duplicate Therapy     Allergy     No Medication Administration End Date     Incorrect Dose     Additional Drug Therapy Needed     Significant med changes from prior encounter (inform family/care partners about these prior to discharge).    Other  PTA meds: Zyrtec 10 mg daily Vitamin D 1000 units daily Novolog flexpen 30 units [place] with meals and sliding scale Tresiba 30 units 2 times a day Nystatin topical powder applied to skin folds three times daily Zofran 4 mg po q8h as needed for N/V Spiriva respimat 2 puffs daily Demadex 80 mg qam and 40 mg qpm Trulicity 3 mg  weekly on Sundays Restart PTA meds when and if necessary during CIR admission or at time of discharge, if warranted     Clinically significant medication issues were identified that warrant physician communication and completion of prescribed/recommended actions by midnight of the next day:  No   Time spent performing this drug regimen review (minutes):  30  Earle Reome BS, PharmD, BCPS Clinical Pharmacist 05/25/2022 1:30 PM  Contact: [phone] after  3 PM  "Be curious, not judgmental..." -Jamal Maes

## 2022-05-25 NOTE — Progress Notes (Signed)
     Daily Progress Note Intern Pager: [phone]  Patient name: [name] Medical record number: [id number] Date of birth: February 18, 1976 Age: 46 y.o. Gender: female  Primary Care Provider: [name], MD Consultants: Podiatry, ID, Ortho Code Status: Full  Pt Overview and Major Events to Date:  6/9: Admitted for HHS, Endo tool started 6/10: Off Endo tool, transition to subcu insulin 6/16: Revision of prior right ray amputation  Assessment and Plan:  [name] is a 46 year old female who was initially admitted with HHS that is now resolved. Pertinent PMH/PSH includes uncontrolled T2DM, left AKA, CKD stage III, OSA on BiPAP, bipolar disorder and COPD.   * Osteomyelitis of foot, right, acute ([name]) POD #4 s/p revision of R first ray amputation. Pain is well controlled and pt did well with PT. Medically stable and awaiting CIR placement -Podiatry following, appreciate their expertise -Continue Augmentin (day 4/7)  -f/u surgical pathology to assess margins -Nonweight bearing on right foot -Tylenol '650mg'$  q6h prn -Percocet 7.5-325 q6h prn  Gout of knee, Right Improving -Continue colchicine 0.3 mg daily -Continue cold compresses -Home Percocet as needed   Type 2 diabetes mellitus with hyperosmolar hyperglycemic state (HHS) ([place]) HHS resolved.  Appropriate glycemic control (CBG 111-247) in 24 hours. -Continue Semglee 50 units BID -Continue Novolog 20units TID with meals -Continue resistant SSI -Carb modified diet -CBG monitoring qAC and qHS  Diastolic HF (heart failure) ([name]) Euvolemic.  Weight today is 158.2kg -Holding home torsemide due to recent AKI -Daily weights, strict I's and O's  Hyperkalemia Resolved, K this morning was 4.5 -Monitor with daily BMP.  Hypertension BP stable this morning at 135/78.  -Continue losartan '50mg'$  daily -Monitor BP with routine vitals    FEN/GI: Carb modified diet PPx: Lovenox Dispo:CIR today. Barriers include insurance  authorization.   Subjective:  [name] says she is doing well but pain in her right knee which is mildly improved by Tylenol yesterday night.  Objective: Temp:  [98.2 F (36.8 C)-98.4 F (36.9 C)] 98.2 F (36.8 C) (06/20 1022) Pulse Rate:  [95-103] 95 (06/20 1022) Resp:  [18-20] 20 (06/20 1022) BP: (135-157)/(70-76) 157/76 (06/20 1022) SpO2:  [95 %-98 %] 95 % (06/20 1022) Physical Exam: General: Alert, well appearing, NAD CV: RRR, no murmurs, normal S1/S2 Pulm: CTAB, good WOB on RA, no crackles or wheezing Skin: dry, warm Ext: Left BKA, history of right foot C/D/I. Mild erythema of R knee with mild tenderness    Laboratory: Most recent CBC Lab Results  Component Value Date   WBC 13.9 (H) 05/25/2022   HGB 9.7 (L) 05/25/2022   HCT 31.2 (L) 05/25/2022   MCV 94.0 05/25/2022   PLT 415 (H) 05/25/2022   Most recent BMP    Latest Ref Rng & Units 05/25/2022    4:01 AM  BMP  Glucose 70 - 99 mg/dL 111   BUN 6 - 20 mg/dL 32   Creatinine 0.44 - 1.00 mg/dL 1.53   Sodium 135 - 145 mmol/L 140   Potassium 3.5 - 5.1 mmol/L 4.5   Chloride 98 - 111 mmol/L 105   CO2 22 - 32 mmol/L 27   Calcium 8.9 - 10.3 mg/dL 9.0      Imaging/Diagnostic Tests: No new images  Alen Bleacher, MD 05/25/2022, 10:27 AM  PGY-1, [place] Intern pager: [phone], text pages welcome Secure chat group [place]

## 2022-05-25 NOTE — Progress Notes (Signed)
Pt arrived to the unit with no issues, skin assessment completed. Patient oriented to unit and room. Pt has no other complaints or concerns.

## 2022-05-25 NOTE — Progress Notes (Signed)
  Inpatient Rehabilitation Admissions Coordinator   CIR bed is available to admit her to today. Dr Adah Salvage and acute team made aware. I will make the arrangements to admit today.  Danne Baxter, RN, MSN Rehab Admissions Coordinator [phone] 05/25/2022 1:12 PM

## 2022-05-25 NOTE — Discharge Instructions (Addendum)
Dear [name],   Thank you for letting us participate in your care! In this section, you will find a brief hospital admission summary of why you were admitted to the hospital and post-hospital plan.  You were admitted because your blood sugars were high.  And you were treated with insulin drip and eventually transitioned to injections. 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   You had a right knee pain that was likely due to worsening arthritis and gout.  You were treated with colchicine and pain medication. Physical therapy can help with your knee pain.  Please call this number at discharge for your post surgery follow up appointment with the Podiatirst. [phone]  Medication  updates - Gabapentin reduced to 900 mg 3 times daily - Diabetes medication: 45 units Tresiba 2 times daily and 20 units NovoLog 3 times daily with before meals.  Use your glucometer to monitor your glucose closely. - Started Losartan 50 mg daily for Blood pressure   POST-HOSPITAL & CARE INSTRUCTIONS Continue your Augmentin for 3 more days. Please let PCP/Specialists know of any changes in medications that were made.  Please see medications section of this packet for any medication changes.   DOCTOR'S APPOINTMENTS & FOLLOW UP Future Appointments  Date Time Provider [place]  06/01/2022  1:55 PM Genia Del, MD [facility]  06/02/2022  2:45 PM [name], DPM [facility]  08/30/2022 11:40 AM Lovorn, Jinny Blossom, MD [facility]     Thank you for choosing [facility]! Take care and be well!  [facility]  [place], [place] [address] [phone]

## 2022-05-25 NOTE — Progress Notes (Deleted)
Inpatient Rehabilitation Admission Medication Review by a Pharmacist  A complete drug regimen review was completed for this patient to identify any potential clinically significant medication issues.  High Risk Drug Classes Is patient taking? Indication by Medication  Antipsychotic Yes Compazine- N/V  Anticoagulant Yes Lovenox- VTE prophylaxis- 0.5 mg/kg daily dose for BMI >30  Antibiotic Yes Augmentin- osteomyelitis RT foot  End date: 05/29/2022 after AM dose  Opioid Yes Percocet- acute pain (gout)  Antiplatelet Yes Aspirin- CVA prophylaxis  Hypoglycemics/insulin Yes Insulin- T2DM  Vasoactive Medication Yes Cozaar- hypertension  Chemotherapy No   Other Yes Colchicine- gout, RT knee Cymbalta- MDD Neurontin- neuropathic pain     Type of Medication Issue Identified Description of Issue Recommendation(s)  Drug Interaction(s) (clinically significant)     Duplicate Therapy     Allergy     No Medication Administration End Date     Incorrect Dose     Additional Drug Therapy Needed     Significant med changes from prior encounter (inform family/care partners about these prior to discharge).    Other  PTA meds: Zyrtec 10 mg daily Vitamin D 1000 units daily Novolog flexpen 30 units [place] with meals and sliding scale Tresiba 30 units 2 times a day Nystatin topical powder applied to skin folds three times daily Zofran 4 mg po q8h as needed for N/V Spiriva respimat 2 puffs daily Demadex 80 mg qam and 40 mg qpm Trulicity 3 mg [place] weekly on Sundays Restart PTA meds when and if necessary during CIR admission or at time of discharge, if warranted     Clinically significant medication issues were identified that warrant physician communication and completion of prescribed/recommended actions by midnight of the next day:  No   Time spent performing this drug regimen review (minutes):  30  Earle Reome BS, PharmD, BCPS Clinical Pharmacist 05/25/2022 1:30 PM  Contact: [phone] after  3 PM  "Be curious, not judgmental..." -Jamal Maes

## 2022-05-25 NOTE — Progress Notes (Signed)
Jennye Boroughs, MD  Physician Physical Medicine and Rehabilitation PMR Pre-admission    Signed Date of Service:  05/25/2022  1:33 PM  Related encounter: ED to Hosp-Admission (Current) from 05/14/2022 in [facility]   Signed      PMR Admission Coordinator Pre-Admission Assessment   Patient: [name] is an 46 y.o., female MRN: [id number] DOB: 1976-08-05 Height: '5\' 2"'  (157.5 cm) Weight: (!) 158.2 kg   Insurance Information HMO:     PPO:      PCP:      IPA:      80/20:      OTHER:  PRIMARY: Medicaid [place] Access      Policy#: [id number]      Subscriber: pt Benefits:  Phone #: passport one source online     Name: 6/19 Eff. Date: active 6/20     Deduct:       Out of Pocket Max:        CIR: per Medicaid guidelines      SNF: per medicaid Outpatient: per [facility] Health: per medicaid      DME: per medicaid Providers: in network  SECONDARY: none   Financial Counselor:       Phone#:    [employer]" for patients in Inpatient Rehabilitation Facilities with attached "Privacy Act [place] Records" was provided and verbally reviewed with: N/A   Emergency Contact Information Contact Information       Name Relation Home Work Mobile    [place] Mother     [phone]    [place] Daughter     [phone]    Alben Spittle     [phone]         Current Medical History  Patient Admitting Diagnosis: Debility    History of Present Illness: 46 year old female with history of type 2 DM, neuropathy, COPD, Left AKA, CKD 3, depression, bipolar and OSA on BiPAP at HS. Presented on 05/14/22 with hyperglycemia over 600 in the past week. Polyuria, nausea and emesis. Lab findings consistent with hyperosmolar hyperglycemic state in the setting of type 2 DM.   Treated for HHS and now continues on Semglee, Novolog and SSI. AKI treated with fluid resuscitation , avoid nephrotoxic agents and monitoring of daily BMP  labs. Diastolic heart failure with use of home torsemide once kidney function stabilized. Noted HTN. Has been on Coreg in the past but she discontinued on her own pta. Began Losartan. Treated with IV Micafungin for history of Candida glabrata infection of right stump infection.    Osteomyelitis of right foot. Podiatry consulted with revision of right first ray amputation. Placed on Augmentin. NWB of RLE. Percocet for pain. Colchicine for right knee gout.    Patient's medical record from [facility]  has been reviewed by the rehabilitation admission coordinator and physician.   Past Medical History      Past Medical History:  Diagnosis Date   Abscess of groin, left     Acute on chronic diastolic (congestive) heart failure ([place]) 02/05/2022   Acute on chronic respiratory failure with hypoxia ([place]) 04/11/2022   Acute osteomyelitis of ankle or foot, left ([place]) 01/31/2018   Acute respiratory failure with hypoxia and hypercarbia ([place]) 06/15/2020   Alveolar hypoventilation     Amputation stump infection ([place]) 04/09/2018   Anemia      not on iron pill   Asthma     Bipolar 2 disorder ([name])     Candidiasis of vagina 05/15/2022  Carpal tunnel syndrome on right      recurrent   Cellulitis 08/2010-08/2011   Chest pain with low risk for cardiac etiology 05/11/2021   Chronic pain     COPD (chronic obstructive pulmonary disease) ([name])      Symbicort daily and Proventil as needed   Costochondritis     De Quervain's tenosynovitis, bilateral 11/01/2015   Depression     Diabetes mellitus type II, uncontrolled 2000    Type 2, Uncontrolled.Takes Lantus daily.Fasting blood sugar runs 150   Diabetic ulcer of right foot ([place]) 12/19/2021   Drug-seeking behavior     Elevated troponin     Exposure to mold 04/21/2020   GERD (gastroesophageal reflux disease)      takes Pantoprazole and Zantac daily   HLD (hyperlipidemia)      takes Atorvastatin daily   Hypertension      takes Lisinopril and Coreg daily    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[place]) 04/11/2019   Left shoulder pain 06/23/2019   Morbid obesity ([place])     Morbid obesity with BMI of 60.0-69.9, adult ([place]) 04/15/2021   Nocturia     Nonobstructive atherosclerosis of coronary artery 11/26/2020    11/26/20 Coronary angiogram: 1.  Mild diffuse proximal LAD stenosis with no evidence of obstructive disease (20 to 30% diffuse stenosis);  2.  Left dominant circumflex with widely patent obtuse marginal branches and left PDA branch, no significant stenoses present  3.  Nondominant RCA with mild diffuse nonobstructive stenosis    OSA on CPAP     Peripheral neuropathy      takes Gabapentin daily   Pneumonia      "walking" several yrs ago and as a baby (12/05/2018)   Pneumonia due to COVID-19 virus 04/14/2021   Primary osteoarthritis of first carpometacarpal joint of left hand 07/30/2016   Rectal fissure     Restless leg     Right carpal tunnel syndrome 09/01/2011   SVT (supraventricular tachycardia) ([place])     Syncope 02/25/2016   Thrombocytosis 04/11/2022   Urinary frequency     Urinary incontinence 10/23/2020   UTI (urinary tract infection) 04/15/2021   Varicose veins      Right medial thigh and Left leg     Has the patient had major surgery during 100 days prior to admission? Yes   Family History   family history includes Allergic rhinitis in her mother; Anxiety disorder in her sister; Asthma in her child; Cancer in her maternal grandmother; Depression in her mother; Diabetes in her maternal grandmother and mother; GER disease in her mother; Heart attack in her father, paternal grandfather, paternal grandmother, and paternal uncle; Heart disease in her paternal grandfather and paternal grandmother; Hyperlipidemia in her maternal grandmother and mother; Hypertension in her maternal grandmother; Migraines in her sister; Other in her maternal grandfather; Restless legs syndrome in her mother.   Current Medications   Current Facility-Administered Medications:     acetaminophen (TYLENOL) tablet 650 mg, 650 mg, Oral, Q6H PRN, [name], DPM, 650 mg at 05/20/22 1103   albuterol (PROVENTIL) (2.5 MG/3ML) 0.083% nebulizer solution 3 mL, 3 mL, Inhalation, Q6H PRN, [name], DPM   amoxicillin-clavulanate (AUGMENTIN XR) 1000-62.5 MG per 12 hr tablet 2 tablet, 2 tablet, Oral, Q12H, Alen Bleacher, MD, 2 tablet at 05/25/22 1740   aspirin EC tablet 81 mg, 81 mg, Oral, Daily, [name], DPM, 81 mg at 05/25/22 1000   Chlorhexidine Gluconate Cloth 2 % PADS 6 each, 6 each, Topical, Q0600,  [name], DPM, 6 each at 05/24/22 1132   colchicine tablet 0.3 mg, 0.3 mg, Oral, Daily, Zola Button, MD, 0.3 mg at 05/25/22 1048   dextrose 50 % solution 0-50 mL, 0-50 mL, Intravenous, PRN, [name], DPM   DULoxetine (CYMBALTA) DR capsule 60 mg, 60 mg, Oral, Daily, [name], DPM, 60 mg at 05/25/22 1000   enoxaparin (LOVENOX) injection 80 mg, 80 mg, Subcutaneous, Q24H, [name], DPM, 80 mg at 05/25/22 1001   gabapentin (NEURONTIN) tablet 900 mg, 900 mg, Oral, TID, [name], DPM, 900 mg at 05/25/22 1000   insulin aspart (novoLOG) injection 0-20 Units, 0-20 Units, Subcutaneous, TID WC, [name], DPM, 7 Units at 05/24/22 1755   insulin aspart (novoLOG) injection 0-5 Units, 0-5 Units, Subcutaneous, QHS, [name], DPM, 2 Units at 05/20/22 2120   insulin aspart (novoLOG) injection 20 Units, 20 Units, Subcutaneous, TID WC, Wells Guiles, DO, 20 Units at 05/25/22 1322   insulin glargine-yfgn (SEMGLEE) injection 50 Units, 50 Units, Subcutaneous, BID, [name], DPM, 50 Units at 05/25/22 0959   losartan (COZAAR) tablet 50 mg, 50 mg, Oral, Daily, Alen Bleacher, MD, 50 mg at 05/25/22 1000   MEDLINE mouth rinse, 15 mL, Mouth Rinse, BID, Boneta Lucks P, DPM, 15 mL at 05/23/22 1051   mometasone-formoterol (DULERA) 200-5 MCG/ACT inhaler 2 puff, 2 puff, Inhalation, BID, [name], DPM, 2 puff at 05/20/22 0802   oxyCODONE-acetaminophen  (PERCOCET) 7.5-325 MG per tablet 1 tablet, 1 tablet, Oral, Q6H PRN, [name], DPM, 1 tablet at 05/24/22 1532   prednisoLONE acetate (PRED FORTE) 1 % ophthalmic suspension 1 drop, 1 drop, Both Eyes, QID, [name], DPM, 1 drop at 05/18/22 2012   revefenacin (YUPELRI) nebulizer solution 175 mcg, 175 mcg, Nebulization, Daily, [name], DPM, 175 mcg at 05/20/22 0802   sodium chloride flush (NS) 0.9 % injection 10-40 mL, 10-40 mL, Intracatheter, PRN, [name], DPM, 10 mL at 05/22/22 1610   Patients Current Diet:  Diet Order                  Diet Carb Modified Fluid consistency: Thin; Room service appropriate? Yes  Diet effective now                       Precautions / Restrictions Precautions Precautions: Other (comment), Fall Precaution Comments: hx of L BKA, prosthesis in room Restrictions Weight Bearing Restrictions: Yes RLE Weight Bearing: Non weight bearing Other Position/Activity Restrictions: LLE prosthetic in room    Has the patient had 2 or more falls or a fall with injury in the past year? No   Prior Activity Level Community (5-7x/wk): power wheelchair or whellchair for majority. Could use LE prosthesis and RW fo rshort distances   Prior Functional Level Self Care: Did the patient need help bathing, dressing, using the toilet or eating? Needed some help   Indoor Mobility: Did the patient need assistance with walking from room to room (with or without device)? Needed some help   Stairs: Did the patient need assistance with internal or external stairs (with or without device)? Needed some help   Functional Cognition: Did the patient need help planning regular tasks such as shopping or remembering to take medications? Independent   Patient Information Are you of Hispanic, Latino/a,or Spanish origin?: A. No, not of Hispanic, Latino/a, or Spanish origin What is your race?: A. White Do you need or want  an interpreter to communicate with a doctor or  health care staff?: 0. No   Patient's Response To:  Health Literacy and Transportation Is the patient able to respond to health literacy and transportation needs?: Yes Health Literacy - How often do you need to have someone help you when you read instructions, pamphlets, or other written material from your doctor or pharmacy?: Never In the past 12 months, has lack of transportation kept you from medical appointments or from getting medications?: No In the past 12 months, has lack of transportation kept you from meetings, work, or from getting things needed for daily living?: No   Development worker, international aid / [place] Devices/Equipment: CBG Meter, Oxygen, Wheelchair Home Equipment: Conservation officer, nature (2 wheels), BSC/3in1, Grab bars - tub/shower, Tub bench, Wheelchair - manual   Prior Device Use: Indicate devices/aids used by the patient prior to current illness, exacerbation or injury? Motorized wheelchair or scooter, Environmental consultant, and Orthotics/Prosthetics   Current Functional Level Cognition   Overall Cognitive Status: Within Functional Limits for tasks assessed Current Attention Level: Selective Orientation Level: Oriented X4 Following Commands: Follows one step commands with increased time, Follows one step commands inconsistently Safety/Judgement: Decreased awareness of deficits, Decreased awareness of safety General Comments: alert and eager to participate. Fearful of attempting transfers but with education and encouragement she performed well.    Extremity Assessment (includes Sensation/Coordination)   Upper Extremity Assessment: Overall WFL for tasks assessed  Lower Extremity Assessment: RLE deficits/detail, LLE deficits/detail RLE Deficits / Details: R transmetatarsal amputation RLE: Unable to fully assess due to pain RLE Coordination: decreased gross motor LLE Deficits / Details: L AKA LLE Coordination: decreased gross motor     ADLs   Overall ADL's : Needs  assistance/impaired Eating/Feeding: Set up, Sitting Grooming: Minimal assistance, Sitting Upper Body Bathing: Maximal assistance, Sitting, Bed level Lower Body Bathing: Maximal assistance, Bed level, Sitting/lateral leans Upper Body Dressing : Maximal assistance, Sitting Upper Body Dressing Details (indicate cue type and reason): to don gown Lower Body Dressing: Maximal assistance, Bed level Lower Body Dressing Details (indicate cue type and reason): to donn left prosthesis Toilet Transfer: Maximal assistance, +2 for physical assistance Toilet Transfer Details (indicate cue type and reason): via lateral scoot Toileting- Clothing Manipulation and Hygiene: Total assistance, Bed level Toileting - Clothing Manipulation Details (indicate cue type and reason): for pericare Functional mobility during ADLs: Maximal assistance, +2 for physical assistance General ADL Comments: Donned left prosthesis seated onEOB     Mobility   Overal bed mobility: Needs Assistance Bed Mobility: Sit to Sidelying, Sidelying to Sit Rolling: Min guard Sidelying to sit: Min assist Supine to sit: Min assist Sit to supine: Min assist Sit to sidelying: Min guard General bed mobility comments: seated on EOB, Pt required min assistance with trunk control to move into sidelying for board placement and pad placement.     Transfers   Overall transfer level: Needs assistance Equipment used: Sliding board Transfers: Bed to chair/wheelchair/BSC Sit to Stand: Mod assist, +2 physical assistance (Heavy cues for R foot placement to and from seated surface to keep R foot offloaded.  She did present with TDWB at times due to fear but quickly returns to NWB.) Bed to/from chair/wheelchair/BSC transfer type:: Lateral/scoot transfer  Lateral/Scoot Transfers: Min assist, Mod assist, +2 physical assistance, With slide board General transfer comment: Pt required cues for board placement weight shifting and assistance to scoot to the L  across the board to [facility] (with minA +2) and from [facility] toward her L  side for back to bed (with modA +2 to slightly higher bed surface). Pt required max VCs with poor sitting balance with dynamic movement. Defer standing for safety due to pt poor compliance with RLE NWB restrictions during previous session.     Ambulation / Gait / Stairs / Wheelchair Mobility   Ambulation/Gait General Gait Details: unable to attempt due to LLE being without prosthesis Wheelchair Mobility Wheelchair mobility: Yes Wheelchair propulsion: Both upper extremities Wheelchair parts: Needs assistance Distance: 100 Wheelchair Assistance Details (indicate cue type and reason): minA at times to assist pt while propelling wheelchair and min/modA while pt turning chair to maintain pt momentum; multiple brief rest breaks 2/2 pt UE fatigue and DOE to 2/4. SpO2/HR WFL. Mod cues for proper grip on wheel rails, technique to propel/turn chair and for energy conservation.     Posture / Balance Dynamic Sitting Balance Sitting balance - Comments: sitting on EOB upon entry, with lateral leans to reach pt unsteady needing up to minA for lateral leans with elbow taps to sit back upright Balance Overall balance assessment: Needs assistance Sitting-balance support: No upper extremity supported Sitting balance-Leahy Scale: Fair Sitting balance - Comments: sitting on EOB upon entry, with lateral leans to reach pt unsteady needing up to minA for lateral leans with elbow taps to sit back upright Standing balance-Leahy Scale: Poor Standing balance comment: defer for safety, NWB RLE     Special needs/care consideration BiPAP at HS Hgb A1c 14.9    Previous Home Environment  Living Arrangements:  (30 year old daughter, friend and her 35 year old son)  Lives With: Family, Friend(s) (Friend moved in a month ago) Available Help at Discharge: Family, Personal care attendant, Available 24 hours/day, Friend(s) Type of Home: Apartment Home Layout: One  level Home Access: Level entry Bathroom Shower/Tub: Tub/shower unit, Sponge bathes at baseline [employer]: Standard Bathroom Accessibility: Yes How Accessible: Accessible via walker Home Care Services: Yes Type of Home Care Services: Homehealth aide (CAPS aide) Additional Comments: LLE prosthesis   Discharge Living Setting Plans for Discharge Living Setting: Patient's home, Apartment, Lives with (comment) (64 year old daughter and friend with friend's 45 year old son) Type of Home at Discharge: Apartment Discharge Home Layout: One level Discharge Home Access: Level entry Discharge Bathroom Shower/Tub: Tub/shower unit (sponge bathes at baseline) Discharge Bathroom Toilet: Standard Discharge Bathroom Accessibility: Yes How Accessible: Accessible via walker Does the patient have any problems obtaining your medications?: No   Social/Family/Support Systems Patient Roles: Parent Contact Information: mother and sister Anticipated Caregiver: friend and 35 year old daughter Anticipated Caregiver's Contact Information: see contacts Ability/Limitations of Caregiver: friend lives with her, 37 year old daughter Caregiver Availability: 24/7 Discharge Plan Discussed with Primary Caregiver: Yes Is Caregiver In Agreement with Plan?: Yes Does Caregiver/Family have Issues with Lodging/Transportation while Pt is in Rehab?: No   Goals Patient/Family Goal for Rehab: supervision to min assist with PT and PT at wheelchair level Expected length of stay: ELOS 7 to 10 days Additional Information: Unable to get power wheelchair to hospital Pt/Family Agrees to Admission and willing to participate: Yes Program Orientation Provided & Reviewed with Pt/Caregiver Including Roles  & Responsibilities: Yes   Decrease burden of Care through IP rehab admission: n/a   Possible need for SNF placement upon discharge: not anticipated   Patient Condition: I have reviewed medical records from [facility],  spoken with CM, and patient and daughter. I met with patient at the bedside for inpatient rehabilitation assessment.  Patient  will benefit from ongoing PT and OT, can actively participate in 3 hours of therapy a day 5 days of the week, and can make measurable gains during the admission.  Patient will also benefit from the coordinated team approach during an Inpatient Acute Rehabilitation admission.  The patient will receive intensive therapy as well as Rehabilitation physician, nursing, social worker, and care management interventions.  Due to bladder management, bowel management, safety, skin/wound care, disease management, medication administration, pain management, and patient education the patient requires 24 hour a day rehabilitation nursing.  The patient is currently mod assist overall with mobility and basic ADLs.  Discharge setting and therapy post discharge at home with home health is anticipated.  Patient has agreed to participate in the Acute Inpatient Rehabilitation Program and will admit today.   Preadmission Screen Completed By:  Cleatrice Burke, 05/25/2022 1:33 PM ______________________________________________________________________   Discussed status with Dr. Curlene Dolphin on 05/25/22 at 1346 and received approval for admission today.   Admission Coordinator:  Cleatrice Burke, RN, time  1346 Date 05/25/22    Assessment/Plan: Diagnosis: revision to right foot ray amputation and recent admission for HHS treatment Does the need for close, 24 hr/day Medical supervision in concert with the patient's rehab needs make it unreasonable for this patient to be served in a less intensive setting? Yes Co-Morbidities requiring supervision/potential complications: Gout, DM-2, diastoic heart failure, HTN, COPD Due to bladder management, bowel management, safety, skin/wound care, disease management, medication administration, pain management, and patient education, does the patient require 24  hr/day rehab nursing? Yes Does the patient require coordinated care of a physician, rehab nurse, PT, OT, and SLP to address physical and functional deficits in the context of the above medical diagnosis(es)? Yes Addressing deficits in the following areas: balance, endurance, locomotion, strength, transferring, bowel/bladder control, bathing, dressing, feeding, grooming, and toileting Can the patient actively participate in an intensive therapy program of at least 3 hrs of therapy 5 days a week? Yes The potential for patient to make measurable gains while on inpatient rehab is good Anticipated functional outcomes upon discharge from inpatient rehab: supervision and min assist PT, supervision and min assist OT, n/a SLP Estimated rehab length of stay to reach the above functional goals is: 7-10 days Anticipated discharge destination: Home 10. Overall Rehab/Functional Prognosis: good     MD Signature: Jennye Boroughs         Revision History                      Note Details  Traci Sermon, MD File Time 05/25/2022  2:07 PM  Author Type Physician Status Signed  Last Editor Jennye Boroughs, MD Service Physical Medicine and Rehabilitation

## 2022-05-25 NOTE — H&P (Signed)
Physical Medicine and Rehabilitation Admission H&P    CC: Debility secondary to surgical revision of right foot transmetatarsal amputation as well treatment for hyperglycemia  HPI: [name] is a 46 year old female who presented to the emergency department on the morning of 05/14/2022 complaining of fatigue and polyuria.  In addition, she noted worsening pain to her chronic right foot ulcer.  She reported she has had elevated blood sugars over the past several days and her endocrinologist encouraged her to go to the emergency department for further management.  Her serum glucose was 793.  She was started on insulin infusion, IVFs, dextrose. MRI obtained of right foot.  Podiatry, infectious disease and orthopedic surgery consultations obtained.  The patient was taken to the OR on 6/16 and underwent transmetatarsal amputation revision by Dr. Boneta Lucks. Incision closed with staples. Tolerated well.  Dr., Recommended a 7-day course of Augmentin postoperatively for the possibility remaining soft tissue infection.  He anion gap Improved, transitioned to subcutaneous insulin, she remained afebrile with tolerating carb modified diet.  PT and OT evaluations carried out. She reports her pain is under good control. She had OA/gout pain in her R knee, but this is improving. She The patient requires inpatient medicine and rehabilitation evaluations and services for ongoing dysfunction secondary to revision right transmetatarsal amputation.  Significant medical history includes obstructive sleep apnea on nighttime BiPAP, bipolar disorder, acute kidney injury atop chronic kidney disease stage III, previous left above-knee amputation.  Her daughter was in the room.   Review of Systems  Constitutional:  Negative for chills and fever.  HENT:  Negative for congestion and sore throat.   Eyes:  Negative for blurred vision and double vision.  Respiratory:  Negative for cough and sputum production.    Cardiovascular:  Negative for chest pain and palpitations.  Gastrointestinal:  Negative for constipation, nausea and vomiting.  Genitourinary:  Negative for dysuria and urgency.  Musculoskeletal:  Negative for back pain and neck pain.  Skin:  Negative for rash.  Neurological:  Negative for dizziness and headaches.   Past Medical History:  Diagnosis Date   Abscess of groin, left    Acute on chronic diastolic (congestive) heart failure ([place]) 02/05/2022   Acute on chronic respiratory failure with hypoxia ([name]) 04/11/2022   Acute osteomyelitis of ankle or foot, left ([place]) 01/31/2018   Acute respiratory failure with hypoxia and hypercarbia ([place]) 06/15/2020   Alveolar hypoventilation    Amputation stump infection ([place]) 04/09/2018   Anemia    not on iron pill   Asthma    Bipolar 2 disorder ([name])    Candidiasis of vagina 05/15/2022   Carpal tunnel syndrome on right    recurrent   Cellulitis 08/2010-08/2011   Chest pain with low risk for cardiac etiology 05/11/2021   Chronic pain    COPD (chronic obstructive pulmonary disease) ([name])    Symbicort daily and Proventil as needed   Costochondritis    De Quervain's tenosynovitis, bilateral 11/01/2015   Depression    Diabetes mellitus type II, uncontrolled 2000   Type 2, Uncontrolled.Takes Lantus daily.Fasting blood sugar runs 150   Diabetic ulcer of right foot ([place] [facility]) 12/19/2021   Drug-seeking behavior    Elevated troponin    Exposure to mold 04/21/2020   GERD (gastroesophageal reflux disease)    takes Pantoprazole and Zantac daily   HLD (hyperlipidemia)    takes Atorvastatin daily   Hypertension    takes Lisinopril and Coreg daily   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[place]) 04/11/2019  Left shoulder pain 06/23/2019   Morbid obesity ([place])    Morbid obesity with BMI of 60.0-69.9, adult ([facility]) 04/15/2021   Nocturia    Nonobstructive atherosclerosis of coronary artery 11/26/2020   11/26/20 Coronary angiogram: 1.  Mild diffuse proximal LAD stenosis with no evidence  of obstructive disease (20 to 30% diffuse stenosis);  2.  Left dominant circumflex with widely patent obtuse marginal branches and left PDA branch, no significant stenoses present  3.  Nondominant RCA with mild diffuse nonobstructive stenosis    OSA on CPAP    Peripheral neuropathy    takes Gabapentin daily   Pneumonia    "walking" several yrs ago and as a baby (12/05/2018)   Pneumonia due to COVID-19 virus 04/14/2021   Primary osteoarthritis of first carpometacarpal joint of left hand 07/30/2016   Rectal fissure    Restless leg    Right carpal tunnel syndrome 09/01/2011   SVT (supraventricular tachycardia) ([place])    Syncope 02/25/2016   Thrombocytosis 04/11/2022   Urinary frequency    Urinary incontinence 10/23/2020   UTI (urinary tract infection) 04/15/2021   Varicose veins    Right medial thigh and Left leg    Past Surgical History:  Procedure Laterality Date   AMPUTATION Left 02/01/2018   Procedure: LEFT FOURTH AND 5TH TOE RAY AMPUTATION;  Surgeon: [name], MD;  Location: [facility];  Service: Orthopedics;  Laterality: Left;   AMPUTATION Left 03/03/2018   Procedure: LEFT BELOW KNEE AMPUTATION;  Surgeon: [name], MD;  Location: [facility];  Service: Orthopedics;  Laterality: Left;   AMPUTATION Right 05/21/2022   Procedure: REVISION AMPUTATION RAY 1ST;  Surgeon: [name], DPM;  Location: [facility];  Service: Podiatry;  Laterality: Right;   CARPAL TUNNEL RELEASE Bilateral    CESAREAN SECTION  2007   CORONARY ANGIOGRAPHY N/A 11/26/2020   Procedure: CORONARY ANGIOGRAPHY;  Surgeon: Sherren Mocha, MD;  Location: [facility];  Service: Cardiovascular;  Laterality: N/A;   FOOT AMPUTATION Right    INCISION AND DRAINAGE ABSCESS Left 10/16/2021   Procedure: INCISION AND DRAINAGE ABSCESS;  Surgeon: Dwan Bolt, MD;  Location: [facility];  Service: General;  Laterality: Left;   INCISION AND DRAINAGE PERIRECTAL ABSCESS Left 05/18/2019   Procedure: IRRIGATION AND DEBRIDEMENT OF PANNIS  ABSCESS, POSSIBLE DEBRIDEMENT OF BUTTOCK WOUND;  Surgeon: Donnie Mesa, MD;  Location: [facility];  Service: General;  Laterality: Left;   IRRIGATION AND DEBRIDEMENT BUTTOCKS Left 05/17/2019   Procedure: IRRIGATION AND DEBRIDEMENT BUTTOCKS;  Surgeon: Donnie Mesa, MD;  Location: [facility];  Service: General;  Laterality: Left;   KNEE ARTHROSCOPY Right 07/17/2010   LEFT HEART CATHETERIZATION WITH CORONARY ANGIOGRAM N/A 07/27/2012   Procedure: LEFT HEART CATHETERIZATION WITH CORONARY ANGIOGRAM;  Surgeon: Sherren Mocha, MD;  Location: [facility];  Service: Cardiovascular;  Laterality: N/A;   MASS EXCISION N/A 06/29/2013   Procedure:  WIDE LOCAL EXCISION OF POSTERIOR NECK ABSCESS;  Surgeon: Ralene Ok, MD;  Location: [facility];  Service: General;  Laterality: N/A;   REPAIR KNEE LIGAMENT Left    "fixed ligaments and chipped patella"   right transmetatarsal amputation      Family History  Problem Relation Age of Onset   Diabetes Mother    Hyperlipidemia Mother    Depression Mother    GER disease Mother    Allergic rhinitis Mother    Restless legs syndrome Mother    Heart attack Paternal Uncle    Heart disease Paternal Grandmother  Heart attack Paternal Grandmother    Heart attack Paternal Grandfather    Heart disease Paternal Grandfather    Heart attack Father    Migraines Sister    Cancer Maternal Grandmother        COLON   Hypertension Maternal Grandmother    Hyperlipidemia Maternal Grandmother    Diabetes Maternal Grandmother    Other Maternal Grandfather        GUN SHOT   Anxiety disorder Sister    Asthma Child    Social History:  reports that she quit smoking about 16 years ago. Her smoking use included cigarettes. She has a 3.75 pack-year smoking history. She has never used smokeless tobacco. She reports that she does not currently use drugs. She reports that she does not drink alcohol. Allergies:  Allergies  Allergen Reactions   Iodine Other (See Comments)    Kidney  dysfunction   Kiwi Extract Shortness Of Breath, Swelling and Anaphylaxis   Morphine And Related Nausea And Vomiting   Nalbuphine Other (See Comments)    Hallucinations     Trental [Pentoxifylline] Nausea And Vomiting   Toradol [Ketorolac Tromethamine] Other (See Comments)    Feels like something is crawling on me   Morphine Nausea And Vomiting   Nubain [Nalbuphine Hcl] Other (See Comments)    "FEELS LIKE SOMETHING CRAWLING ON ME"   Medications Prior to Admission  Medication Sig Dispense Refill   albuterol (VENTOLIN HFA) 108 (90 Base) MCG/ACT inhaler INHALE 1 TO 2 PUFFS BY MOUTH EVERY SIX HOURS AS NEEDED FOR WHEEZING OR SHORTNESS OF BREATH (Patient taking differently: Inhale 1 puff into the lungs every 6 (six) hours as needed for shortness of breath.) 6.7 g 4   aspirin EC 81 MG tablet Take 1 tablet (81 mg total) by mouth daily. Swallow whole. 90 tablet 3   cetirizine (ZYRTEC) 10 MG tablet Take 1 tablet (10 mg total) by mouth daily as needed for allergies. 30 tablet 3   Cholecalciferol 1000 units tablet Take 1 tablet (1,000 Units total) by mouth daily. 90 tablet 0   DULoxetine (CYMBALTA) 30 MG capsule Take 1 capsule (30 mg total) by mouth daily. X 1 week; then 60 mg daily- for knee and back/nerve pain (Patient taking differently: Take 60 mg by mouth daily.) 60 capsule 5   gabapentin (NEURONTIN) 600 MG tablet TAKE 2 TABLETS BY MOUTH 3 TIMES DAILY (Patient taking differently: Take 1,200 mg by mouth in the morning, at noon, and at bedtime.) 180 tablet 2   Glucagon (BAQSIMI ONE PACK) 3 MG/DOSE POWD Place 3 mg into the nose as needed (low blood sugar).     Glucagon, rDNA, (GLUCAGON EMERGENCY) 1 MG KIT Inject 1 mg into the muscle as needed for low blood sugar.     insulin aspart (NOVOLOG FLEXPEN) 100 UNIT/ML FlexPen Inject 20 Units into the skin 3 (three) times daily with meals. (Patient taking differently: Inject 30 Units into the skin 3 (three) times daily with meals. Sliding scale) 45 mL 3    insulin degludec (TRESIBA FLEXTOUCH) 200 UNIT/ML FlexTouch Pen Inject 46 Units into the skin 2 (two) times daily. (Patient taking differently: Inject 30 Units into the skin 2 (two) times daily.) 30 mL 3   nystatin (MYCOSTATIN/NYSTOP) powder Apply 1 application. topically 3 (three) times daily. 30 g 0   ondansetron (ZOFRAN) 4 MG tablet Take 1 tablet (4 mg total) by mouth every 8 (eight) hours as needed for nausea or vomiting. 60 tablet 1   oxyCODONE-acetaminophen (PERCOCET) 7.5-325  MG tablet Take 1 tablet by mouth every 6 (six) hours as needed for severe pain. 120 tablet 0   potassium chloride SA (KLOR-CON M20) 20 MEQ tablet Take 1 tablet (20 mEq total) by mouth daily. 30 tablet 6   prednisoLONE acetate (PRED FORTE) 1 % ophthalmic suspension Place 1 drop into both eyes 4 (four) times daily.     ramelteon (ROZEREM) 8 MG tablet Take 1 tablet (8 mg total) by mouth at bedtime. 90 tablet 0   SYMBICORT 160-4.5 MCG/ACT inhaler inhale 2 PUFFS into THE lungs 2 TIMES DAILY (Patient taking differently: 2 puffs every 6 (six) hours. Shortness of breath) 10.2 g 4   Tiotropium Bromide Monohydrate (SPIRIVA RESPIMAT) 1.25 MCG/ACT AERS Inhale 2 puffs into the lungs daily. 1 each 0   torsemide (DEMADEX) 20 MG tablet Take 20m in am and 412min pm (Patient taking differently: Take 40-80 mg by mouth See admin instructions. Take 4 tablets (80 mg) in am and 2 tablets (40 mg) in pm) 12179ablet 2   TRULICITY 3 MGXT/0.5WPOPN Inject 3 mg into the skin once a week. Sundays     Accu-Chek FastClix Lancets MISC USE DAILY AS INSTRUCTED 102 each 3   ACCU-CHEK GUIDE test strip USE T0 CHECK BLOOD SUGAR 3 TIMES DAILY 100 strip 5   Accu-Chek Softclix Lancets lancets Use to check blood glucose four times daily 100 each 12   Continuous Blood Gluc Sensor (DEXCOM G6 SENSOR) MISC Inject 1 applicator into the skin as directed. Change sensor every 10 days. 3 each 11   Continuous Blood Gluc Transmit (DEXCOM G6 TRANSMITTER) MISC Inject 1 Device  into the skin as directed. Reuse 8 times with sensor changes. 1 each 3   GNP ULTICARE PEN NEEDLES 32G X 4 MM MISC USE TO inject insulin 2 TIMES DAILY 100 each 2      Home: Home Living Family/patient expects to be discharged to:: Private residence Living Arrangements: Children, Non-relatives/Friends Available Help at Discharge: Family, Personal care attendant, Available 24 hours/day, Friend(s) Type of Home: Apartment Home Access: Level entry Home Layout: One level Bathroom Shower/Tub: Tub/shower unit, Sponge bathes at baseline [employer]Standard Bathroom Accessibility: Yes Home Equipment: RoConservation officer, nature2 wheels), BSC/3in1, Grab bars - tub/shower, Tub bench, Wheelchair - manual Additional Comments: has LLE prosthesis, not in room   Functional History: Prior Function Prior Level of Function : Needs assist Mobility Comments: on RW for short trips but main transport is power chair, uses LLE prosthesis regardless ADLs Comments: daughter and aide (5 days x8 hrs/day) assist with ADLs/IADLs, uses transportation services for getting to dr appts  Functional Status:  Mobility: Bed Mobility Overal bed mobility: Needs Assistance Bed Mobility: Supine to Sit, Sit to Supine Rolling: Min guard Supine to sit: Min assist Sit to supine: Min assist General bed mobility comments: seated on EOB, Pt required min assistance with trunk control to move into sidelying for board placement and pad placement. Transfers Overall transfer level: Needs assistance Equipment used: Ambulation equipment used (sara stedy x 4 and sliding board.) Transfers: Sit to/from Stand, Bed to chair/wheelchair/BSC Sit to Stand: Mod assist, +2 physical assistance (Heavy cues for R foot placement to and from seated surface to keep R foot offloaded.  She did present with TDWB at times due to fear but quickly returns to NWB.) Bed to/from chair/wheelchair/BSC transfer type:: Lateral/scoot transfer  Lateral/Scoot Transfers:  Mod assist, +2 physical assistance General transfer comment: Pt required cues for board placement weight shifting and assistance to  scoot to the L and R across the board to recliner and back to bed.  Pt required max VCs with poor sitting balance with dynamic movement.  Pt then performed sit to stand in sara stedy x3 trials and during final trial transferred to and from commode where she urinated and had BM.  Pt able to maintain static stand in sara stedy with foot placed by PTA prior to standing.  She had difficulty maintaining weight bearing on R side during transition. Ambulation/Gait General Gait Details: unable to attempt due to LLE being without prosthesis    ADL: ADL Overall ADL's : Needs assistance/impaired Eating/Feeding: Set up, Sitting Grooming: Minimal assistance, Sitting Upper Body Bathing: Maximal assistance, Sitting, Bed level Lower Body Bathing: Maximal assistance, Bed level, Sitting/lateral leans Upper Body Dressing : Maximal assistance, Sitting Upper Body Dressing Details (indicate cue type and reason): to don gown Lower Body Dressing: Maximal assistance, Bed level Lower Body Dressing Details (indicate cue type and reason): to donn left prosthesis Toilet Transfer: Maximal assistance, +2 for physical assistance Toilet Transfer Details (indicate cue type and reason): via lateral scoot Toileting- Clothing Manipulation and Hygiene: Total assistance, Bed level Toileting - Clothing Manipulation Details (indicate cue type and reason): for pericare Functional mobility during ADLs: Maximal assistance, +2 for physical assistance General ADL Comments: Donned left prosthesis seated onEOB  Cognition: Cognition Overall Cognitive Status: Within Functional Limits for tasks assessed Orientation Level: Oriented X4 Cognition Arousal/Alertness: Awake/alert Behavior During Therapy: WFL for tasks assessed/performed Overall Cognitive Status: Within Functional Limits for tasks assessed Area  of Impairment: Attention, Following commands, Memory, Safety/judgement, Awareness, Problem solving Current Attention Level: Selective Memory: Decreased short-term memory, Decreased recall of precautions Following Commands: Follows one step commands with increased time, Follows one step commands inconsistently Safety/Judgement: Decreased awareness of deficits, Decreased awareness of safety Awareness: Intellectual Problem Solving: Slow processing, Decreased initiation, Difficulty sequencing, Requires verbal cues, Requires tactile cues General Comments: alert and eager to participate. Fearful of attempting transfers but with education and encourage she performed well.  Physical Exam: Blood pressure (!) 157/76, pulse 95, temperature 98.2 F (36.8 C), temperature source Oral, resp. rate 20, height 5' 2" (1.575 m), weight (!) 158.2 kg, SpO2 95 %. Physical Exam Constitutional:      General: She is not in acute distress.    Appearance: She is obese. She is not ill-appearing.  HENT:     Mouth/Throat:     Mouth: Mucous membranes are moist.  Eyes:     General: No scleral icterus.    Extraocular Movements: Extraocular movements intact.     Pupils: Pupils are equal, round, and reactive to light.  Cardiovascular:     Rate and Rhythm: Normal rate and regular rhythm.     Heart sounds: No murmur heard.    No friction rub. No gallop.  Pulmonary:     Effort: Pulmonary effort is normal. No respiratory distress.     Breath sounds: Normal breath sounds.  Abdominal:     General: Bowel sounds are normal. There is no distension.     Palpations: Abdomen is soft.     Tenderness: There is no abdominal tenderness.  Musculoskeletal:        General: No tenderness.     Cervical back: No tenderness.     Right lower leg: Edema present.     Comments: Right foot surgical dressing dry and intact; left BKA well healed, shrinker in place L BKA socket appears loose with increased space around residual limb Tone  normal  No ROM deficits noted Mild swelling R knee, no redness, mild joint line tenderness  Neurological:     General: No focal deficit present.     Mental Status: She is alert and oriented to person, place, and time.     Sensory: No sensory deficit.     Coordination: Coordination normal.     Comments: Follows commands and answers questions Strength 5/5 in b/l UE Strength 4/5 R hip flexion and knee extension Strength 5/5 L hip flexion and 4+/5 knee extension   Psychiatric:        Mood and Affect: Mood normal.        Thought Content: Thought content normal.     Results for orders placed or performed during the hospital encounter of 05/14/22 (from the past 48 hour(s))  Glucose, capillary     Status: Abnormal   Collection Time: 05/23/22  3:53 PM  Result Value Ref Range   Glucose-Capillary 64 (L) 70 - 99 mg/dL    Comment: Glucose reference range applies only to samples taken after fasting for at least 8 hours.  Glucose, capillary     Status: Abnormal   Collection Time: 05/23/22  4:23 PM  Result Value Ref Range   Glucose-Capillary 64 (L) 70 - 99 mg/dL    Comment: Glucose reference range applies only to samples taken after fasting for at least 8 hours.  Glucose, capillary     Status: Abnormal   Collection Time: 05/23/22  6:13 PM  Result Value Ref Range   Glucose-Capillary 137 (H) 70 - 99 mg/dL    Comment: Glucose reference range applies only to samples taken after fasting for at least 8 hours.  Glucose, capillary     Status: Abnormal   Collection Time: 05/23/22  8:51 PM  Result Value Ref Range   Glucose-Capillary 165 (H) 70 - 99 mg/dL    Comment: Glucose reference range applies only to samples taken after fasting for at least 8 hours.  Basic metabolic panel     Status: Abnormal   Collection Time: 05/24/22  3:28 AM  Result Value Ref Range   Sodium 139 135 - 145 mmol/L   Potassium 4.8 3.5 - 5.1 mmol/L   Chloride 106 98 - 111 mmol/L   CO2 26 22 - 32 mmol/L   Glucose, Bld 164 (H)  70 - 99 mg/dL    Comment: Glucose reference range applies only to samples taken after fasting for at least 8 hours.   BUN 34 (H) 6 - 20 mg/dL   Creatinine, Ser 1.28 (H) 0.44 - 1.00 mg/dL   Calcium 8.8 (L) 8.9 - 10.3 mg/dL   GFR, Estimated 53 (L) >60 mL/min    Comment: (NOTE) Calculated using the CKD-EPI Creatinine Equation (2021)    Anion gap 7 5 - 15    Comment: Performed at [facility] [address], [place], [place] [address]  CBC     Status: Abnormal   Collection Time: 05/24/22  3:28 AM  Result Value Ref Range   WBC 10.9 (H) 4.0 - 10.5 K/uL   RBC 3.23 (L) 3.87 - 5.11 MIL/uL   Hemoglobin 9.5 (L) 12.0 - 15.0 g/dL   HCT 30.5 (L) 36.0 - 46.0 %   MCV 94.4 80.0 - 100.0 fL   MCH 29.4 26.0 - 34.0 pg   MCHC 31.1 30.0 - 36.0 g/dL   RDW 14.8 11.5 - 15.5 %   Platelets 443 (H) 150 - 400 K/uL   nRBC 0.0 0.0 - 0.2 %  Comment: Performed at [facility], [place] [address] [place], [place] [address]  Glucose, capillary     Status: Abnormal   Collection Time: 05/24/22  7:28 AM  Result Value Ref Range   Glucose-Capillary 136 (H) 70 - 99 mg/dL    Comment: Glucose reference range applies only to samples taken after fasting for at least 8 hours.  Glucose, capillary     Status: Abnormal   Collection Time: 05/24/22 11:41 AM  Result Value Ref Range   Glucose-Capillary 162 (H) 70 - 99 mg/dL    Comment: Glucose reference range applies only to samples taken after fasting for at least 8 hours.  Glucose, capillary     Status: Abnormal   Collection Time: 05/24/22  4:04 PM  Result Value Ref Range   Glucose-Capillary 247 (H) 70 - 99 mg/dL    Comment: Glucose reference range applies only to samples taken after fasting for at least 8 hours.  Glucose, capillary     Status: Abnormal   Collection Time: 05/24/22  9:22 PM  Result Value Ref Range   Glucose-Capillary 160 (H) 70 - 99 mg/dL    Comment: Glucose reference range applies only to samples taken after fasting for at least 8 hours.  Basic  metabolic panel     Status: Abnormal   Collection Time: 05/25/22  4:01 AM  Result Value Ref Range   Sodium 140 135 - 145 mmol/L   Potassium 4.5 3.5 - 5.1 mmol/L   Chloride 105 98 - 111 mmol/L   CO2 27 22 - 32 mmol/L   Glucose, Bld 111 (H) 70 - 99 mg/dL    Comment: Glucose reference range applies only to samples taken after fasting for at least 8 hours.   BUN 32 (H) 6 - 20 mg/dL   Creatinine, Ser 1.53 (H) 0.44 - 1.00 mg/dL   Calcium 9.0 8.9 - 10.3 mg/dL   GFR, Estimated 43 (L) >60 mL/min    Comment: (NOTE) Calculated using the CKD-EPI Creatinine Equation (2021)    Anion gap 8 5 - 15    Comment: Performed at [facility] [address], [place], [place] [address]  CBC     Status: Abnormal   Collection Time: 05/25/22  4:01 AM  Result Value Ref Range   WBC 13.9 (H) 4.0 - 10.5 K/uL   RBC 3.32 (L) 3.87 - 5.11 MIL/uL   Hemoglobin 9.7 (L) 12.0 - 15.0 g/dL   HCT 31.2 (L) 36.0 - 46.0 %   MCV 94.0 80.0 - 100.0 fL   MCH 29.2 26.0 - 34.0 pg   MCHC 31.1 30.0 - 36.0 g/dL   RDW 14.7 11.5 - 15.5 %   Platelets 415 (H) 150 - 400 K/uL   nRBC 0.1 0.0 - 0.2 %    Comment: Performed at [facility], [place] [address] [place], [place] [address]  Glucose, capillary     Status: None   Collection Time: 05/25/22  8:20 AM  Result Value Ref Range   Glucose-Capillary 88 70 - 99 mg/dL    Comment: Glucose reference range applies only to samples taken after fasting for at least 8 hours.  Glucose, capillary     Status: Abnormal   Collection Time: 05/25/22 11:52 AM  Result Value Ref Range   Glucose-Capillary 108 (H) 70 - 99 mg/dL    Comment: Glucose reference range applies only to samples taken after fasting for at least 8 hours.   *Note: Due to a large number  of results and/or encounters for the requested time period, some results have not been displayed. A complete set of results can be found in Results Review.   No results found.    Blood pressure (!) 157/76, pulse 95, temperature 98.2 F  (36.8 C), temperature source Oral, resp. rate 20, height 5' 2" (1.575 m), weight (!) 158.2 kg, SpO2 95 %.  Medical Problem List and Plan: 1. Functional deficits secondary to revision to right foot ray amputation due to osteomyelitis R foot and recent admission for HHS treatment  -patient may  shower please cover surgical incision  -ELOS/Goals: ELOS 7 to 10 days  -Admit to CIR 2.  Antithrombotics: -DVT/anticoagulation:  Pharmaceutical: Lovenox 80 mg daily  -antiplatelet therapy: Aspirin 81 mg daily 3. Pain Management: Tylenol as needed --Neurontin 900 mg 3 times daily, Duloxetine 10m daily  --Oxycodone 7.5/325 every 6 hours as needed 4. Mood/Sleep: LCSW to evaluate and provide emotional support  -Bipolar disorder: Continue Cymbalta DR 60 mg daily  -antipsychotic agents: n/a 5. Neuropsych/cognition: This patient is capable of making decisions on her own behalf. 6. Skin/Wound Care: Skin care checks  --monitor right foot surgical incision  --continue Augmentin through Friday 6/23 7. Fluids/Electrolytes/Nutrition: Routine I's and O's and follow-up chemistries 8: Gout, acute flare right knee: improving, Continue colchicine 0.3 mg daily 9: Revison of right first ray amputation 1/16>>NWB  --follow-up after discharge with podiatry, Dr. PPosey Pronto -Continue Augmentin until 6/23  --dressing changes as per podiatry 10: DM-2: continue sliding scale insulin with meals and at bedtime  --continue meal coverage NovoLog 20 units  --continue Semglee 50 units twice daily  --follow-up endocrinology/Dr. [email or web address]: Diastolic heart failure: stable, monitor symptoms.  Daily weight.  -She takes home torsemide, it was held due to AKI. Consider restarted when appropriate  12: Hypertension: continue losartan 50 mg daily 13: COPD: continue Yupelri nebulizer daily, Dulera inhaler twice daily 14: Acute kidney injury/CKD stage III: monitor serum BUN and creatinine. Last Cr 1.53 15: Leukocytosis:  continue Augmentin and monitor CBC, incision, fever 16: Anemia: normal red blood cell indices.  Follow-up CBC. Last HGB 9.7 17: Left BKA four years ago; requesting [facility] referral for new shrinker 18: OSA: uses cpap at night with O2 2L   I have personally performed a face to face diagnostic evaluation of this patient and formulated the key components of the plan.  Additionally, I have personally reviewed laboratory data, imaging studies, as well as relevant notes and concur with the physician assistant's documentation above.  The patient's status has not changed from the original H&P.  Any changes in documentation from the acute care chart have been noted above.  YJennye Boroughs MD, FAAPMR   SBarbie Banner PA-C 05/25/2022

## 2022-05-25 NOTE — Discharge Summary (Addendum)
[facility] Discharge Summary  Patient name: [name] Medical record number: [id number] Date of birth: 05/31/1976 Age: 46 y.o. Gender: female Date of Admission: 05/14/2022  Date of Discharge: 05/25/2022 Admitting Physician: Blane Ohara McDiarmid, MD  Primary Care Provider: [name], MD Consultants: Podiatry, Ortho, ID  Indication for Hospitalization: Hyperglycemia  Discharge Diagnoses/Problem List:  Principal Problem:   Osteomyelitis of foot, right, acute [facility]) Active Problems:   Gout of knee, Right   Type 2 diabetes mellitus with hyperosmolar hyperglycemic state (HHS) ([name])   Diastolic HF (heart failure) ([place])   Hypertension   Hyperkalemia    Disposition: CIR  Discharge Condition: Stable  Discharge Exam:  Blood pressure (!) 157/76, pulse 95, temperature 98.2 F (36.8 C), temperature source Oral, resp. rate 20, height _0  (1.575 m), weight (!) 158.2 kg, SpO2 95 %.  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 Mild erythema of R knee with mild tenderness  Brief Hospital Course:  [name] is a 46 y.o. female who was admitted to the [facility] Teaching Service at [facility] for hyperglycemic hyperosmolar state. Hospital course is outlined below by system.    Hyperglycemic hyperosmolar state Patient presented for admission after she reported BG in 600s all week. On admission her CBG was 738 and she complained of polyuria, polydipsia and nausea with no emesis.  Her initial lab findings showed pH 7.45, anion gap 18, bicarb 28.5 and NA 124 consistent with type 2 diabetes with hyperosmolar hyperglycemic state.  She was started on fluid resuscitation and Endo tool.  CBGs were monitored closely per Endo tool protocol and eventually transitioned to subcutaneous basal insulin.  At discharge patient was stable on  20 units 3 times daily short acting insulin with meals, and 50 units twice daily of basal insulin.   Amputation stump infection, right foot ([place]) Patient presents with nonhealing diabetic right foot. Her initial labs on admission showed leukocytosis with white blood count of 15.6, CRP 22.6 and lactic acid 3.3.  Foot right x-ray showed possible osteomyelitis prompting follow-up MRI which confirmed osteomyelitis. Managed initially with IV vancomycin, cefepime and Flagyl until Podiatry was consulted who performed R foot excisional debridement on 6/16. After her procedure patient was transitioned to Augmentin which she was to take for 7 days (Last day 6/23). She is to be nonweightbearing and follow up with podiatry outpatient  Right knee pain  During hospitalization patient was complaining of right knee pain.  On exam was found to have surrounding erythema and edema.  Right knee x-ray was consistent with severe osteoarthritis of the knee and presentation was concerning for gout and patient was treated with colchicine and her home pain medication.  Colchicine was discontinued at discharge and she could benefit from physical therapy.  PCP follow-up recommendations Home torsemide was held during hospitalization due to AKI.  Consider restarting when appropriate Reduce Gabapentin to 900 TID Continue Augmentin with last day scheduled for 6/23 Started Losartan 50 mg daily for BP Diabetes regimen: 45u Tresiba BID and 20u Novolog TID before meals. Should check CBG QAC and QHS  Significant Procedures:  Revision of right first ray amputation (6/16)  Significant Labs and Imaging:  Recent Labs  Lab 05/22/22 0257 05/24/22 0328 05/25/22 0401  WBC 9.8 10.9* 13.9*  HGB 9.4* 9.5* 9.7*  HCT 31.0* 30.5* 31.2*  PLT 377 443* 415*  Recent Labs  Lab 05/21/22 0415 05/22/22 0257 05/23/22 0305 05/24/22 0328 05/25/22 0401  NA 137 140 140 139 140  K 4.7 5.0 5.2* 4.8 4.5  CL 106 108 107 106 105  CO2 _0 GLUCOSE 189* 92 202* 164* 111*  BUN 56* 50* 44* 34* 32*  CREATININE 1.76* 1.89* 1.72* 1.28* 1.53*  CALCIUM 8.6* 8.7* 8.9 8.8* 9.0   DG Foot Complete Right  Result Date: 05/21/2022 CLINICAL DATA:  Status post surgery of the right foot. EXAM: RIGHT FOOT COMPLETE - 3+ VIEW COMPARISON:  Right foot radiographs 05/14/2022; 12/18/2021; MRI right forefoot FINDINGS: There are new surgical skin staples overlying the dorsal medial forefoot. There is progressive amputation of the first ray now to the proximal metatarsal shaft/proximal metaphysis. Unchanged amputation of the second through fifth rays to the proximal shaft. Sharp postsurgical margins. Calcifications within the distal calf likely from chronic venous stasis. IMPRESSION: Interval repeat amputation of the first ray now to the level of the proximal metaphysis. There is a sharp postsurgical margin. Electronically Signed   By: Yvonne Kendall M.D.   On: 05/21/2022 16:50   DG Knee 1-2 Views Right  Result Date: 05/21/2022 CLINICAL DATA:  Right knee pain. EXAM: RIGHT KNEE - 1-2 VIEW COMPARISON:  Right knee radiographs 03/30/2021 FINDINGS: Severe patellofemoral joint space narrowing and peripheral osteophytosis. No joint effusion. Severe medial and moderate lateral compartment joint space narrowing and peripheral osteophytosis. Curvilinear calcific density again overlies the anterior proximal shin soft tissues on lateral view. Calcifications within the anterior subcutaneous soft tissues again likely reflect venous stasis calcification. Moderate atherosclerotic calcification. No acute fracture is seen. No dislocation. IMPRESSION: Severe patellofemoral and medial compartment and moderate lateral compartment osteoarthritis. Electronically Signed   By: Yvonne Kendall M.D.   On: 05/21/2022 11:44   VAS [place] ABI WITH/WO TBI  Result Date: 05/18/2022  LOWER EXTREMITY DOPPLER STUDY Patient Name:  [name]  Date of Exam:   05/18/2022 Medical Rec #: [id number]      Accession #:    [id number] Date of Birth: 1976/07/15      Patient Gender: F Patient Age:   46 years Exam [facility]  [facility] Procedure:      VAS [place] ABI WITH/WO TBI Referring Phys: TODD MCDIARMID --------------------------------------------------------------------------------  Indications: Ulceration. High Risk Factors: Hypertension, hyperlipidemia, Diabetes. Other Factors: H/o left BKA, right transmetatarsal amputation.  Comparison Study: 09/01/17 ABI- right=1.1, left=1.49 Performing Technologist: Maudry Mayhew MHA, RVT, RDCS, RDMS  Examination Guidelines: A complete evaluation includes at minimum, Doppler waveform signals and systolic blood pressure reading at the level of bilateral brachial, anterior tibial, and posterior tibial arteries, when vessel segments are accessible. Bilateral testing is considered an integral part of a complete examination. Photoelectric Plethysmograph (PPG) waveforms and toe systolic pressure readings are included as required and additional duplex testing as needed. Limited examinations for reoccurring indications may be performed as noted.  ABI Findings: +---------+------------------+-----+---------+---------------------------------+ Right    Rt Pressure (mmHg)IndexWaveform Comment                           +---------+------------------+-----+---------+---------------------------------+ Brachial                                 Unable to evaluate due to PICC  line                              +---------+------------------+-----+---------+---------------------------------+ PTA                                      Absent- however may be unable to                                           insonate secondary to limitations +---------+------------------+-----+---------+---------------------------------+ DP       131               0.98 triphasic                                   +---------+------------------+-----+---------+---------------------------------+ Great Toe                                Amputation                        +---------+------------------+-----+---------+---------------------------------+ +---------+------------------+-----+---------+-------+ Left     Lt Pressure (mmHg)IndexWaveform Comment +---------+------------------+-----+---------+-------+ Brachial 133                    triphasic        +---------+------------------+-----+---------+-------+ PTA                                      BKA     +---------+------------------+-----+---------+-------+ DP                                       BKA     +---------+------------------+-----+---------+-------+ Great Toe                                BKA     +---------+------------------+-----+---------+-------+ +-------+-----------+-----------+------------+------------+ ABI/TBIToday's ABIToday's TBIPrevious ABIPrevious TBI +-------+-----------+-----------+------------+------------+ Right  0.98       amp                                 +-------+-----------+-----------+------------+------------+ Left   BKA        BKA                                 +-------+-----------+-----------+------------+------------+ Right ABIs appear essentially unchanged compared to prior study on 09/01/2017.  Summary: Right: Resting right ankle-brachial index is within normal range.  *See table(s) above for measurements and observations.  Electronically signed by Deitra Mayo MD on 05/18/2022 at 3:09:36 PM.    Final    [place] EKG SITE RITE  Result Date: 05/17/2022 If Site Rite image not attached, placement could not be confirmed due to current cardiac rhythm.  MR FOOT RIGHT W WO CONTRAST  Result Date: 05/17/2022 CLINICAL DATA:  Osteomyelitis, foot suspected. Diabetic foot ulcer. History of right foot  amputation, date of surgery unknown. EXAM: MRI OF THE RIGHT FOREFOOT WITHOUT AND WITH  CONTRAST TECHNIQUE: Multiplanar, multisequence MR imaging of the right foot was performed before and after the administration of intravenous contrast. CONTRAST:  41m GADAVIST GADOBUTROL 1 MMOL/ML IV SOLN COMPARISON:  Radiographs 12/05/2018, 12/18/2021 and 05/14/2022. MRI 12/19/2021. FINDINGS: Bones/Joint/Cartilage Status post transmetatarsal amputations through the bases of all the metatarsals, occurring between the radiographs obtained in 2019 and 12/18/2021. These postsurgical changes appear stable from the previous MRI. There is new bone marrow edema and enhancement within 1st metatarsal adjacent to the amputation, suspicious for osteomyelitis. As seen radiographically, there is an adjacent area of soft tissue ulceration and/or soft tissue emphysema. The additional metatarsal bases appear unchanged. No suspicious osseous findings within the midfoot or hindfoot. Ligaments Intact Lisfranc ligament. Muscles and Tendons Chronic fatty atrophy and denervation changes. No focal fluid collection or suspicious enhancement. Soft tissues Increased soft tissue ulceration medially in the remaining forefoot adjacent to the 1st metatarsal base. There is underlying soft tissue enhancement without focal fluid collection. The soft tissue ulceration abuts the signal changes described in the remaining 1st metatarsal above. IMPRESSION: 1. Increased soft tissue ulceration medially in the forefoot abutting the 1st metatarsal remnant. New bone marrow edema and enhancement within the remaining 1st metatarsal suspicious for recurrent osteomyelitis. 2. The additional metatarsal bases and bones of the midfoot and hindfoot appear unchanged. 3. No evidence of soft tissue abscess. Electronically Signed   By: WRichardean SaleM.D.   On: 05/17/2022 11:56   VAS [place]OWER EXTREMITY VENOUS (DVT)  Result Date: 05/16/2022  Lower Venous DVT Study Patient Name:  TKILYNN FITZSIMMONS Date of Exam:   05/15/2022 Medical Rec #: [id number]    Accession #:     [id number] of Birth: 328-Dec-1977     Patient Gender: F Patient Age:   89years [facility]      [facility] [place]OWER EXTREMITY VENOUS (DVT) Referring Phys: TODD MCDIARMID --------------------------------------------------------------------------------  Indications: Pain, and Cellulitis.  Risk Factors: Surgery Right transmetatarsal amputation and left below knee amputation. Limitations: Body habitus (BMI >62). Comparison Study: Prior negative (limited) right LEV done 06/18/20 Performing Technologist: CSharion DoveRVS  Examination Guidelines: A complete evaluation includes B-mode imaging, spectral Doppler, color Doppler, and power Doppler as needed of all accessible portions of each vessel. Bilateral testing is considered an integral part of a complete examination. Limited examinations for reoccurring indications may be performed as noted. The reflux portion of the exam is performed with the patient in reverse Trendelenburg.  +---------+---------------+---------+-----------+----------+-------------------+ RIGHT    CompressibilityPhasicitySpontaneityPropertiesThrombus Aging      +---------+---------------+---------+-----------+----------+-------------------+ CFV                                                   Not well visualized +---------+---------------+---------+-----------+----------+-------------------+ SFJ                                                   Not well visualized +---------+---------------+---------+-----------+----------+-------------------+ FV Prox  Not well visualized +---------+---------------+---------+-----------+----------+-------------------+ FV Mid                                                Not well visualized +---------+---------------+---------+-----------+----------+-------------------+ FV Distal                                             Not well visualized  +---------+---------------+---------+-----------+----------+-------------------+ PFV                                                   Not well visualized +---------+---------------+---------+-----------+----------+-------------------+ POP      Full           Yes      Yes                                      +---------+---------------+---------+-----------+----------+-------------------+ PTV                                                   Not well visualized +---------+---------------+---------+-----------+----------+-------------------+ PERO                                                  Not well visualized +---------+---------------+---------+-----------+----------+-------------------+ GSV      Full                                                             +---------+---------------+---------+-----------+----------+-------------------+   +----+---------------+---------+-----------+----------+-----------------------+ LEFTCompressibilityPhasicitySpontaneityPropertiesThrombus Aging          +----+---------------+---------+-----------+----------+-----------------------+ CFV                Yes      Yes                  patent by color and                                                      Doppler                 +----+---------------+---------+-----------+----------+-----------------------+     Summary: RIGHT: - There is no obvious evidence of deep vein thrombosis in the lower extremity. However, portions of this examination were limited- see technologist comments above.   *See table(s) above for measurements and observations. Electronically signed by Servando Snare MD on 05/16/2022 at 10:08:52 AM.    Final    DG CHEST PORT 1 VIEW  Result Date: 05/15/2022 CLINICAL  DATA:  Orthopnea EXAM: PORTABLE CHEST 1 VIEW COMPARISON:  04/11/2022 FINDINGS: Limited exam because of body habitus. Borderline cardiomegaly with central vascular congestion. Low lung volumes  with elevation of the right hemidiaphragm. Negative for edema, CHF, effusion, definite pneumonia, pneumothorax. Trachea midline. Degenerative changes of the spine. IMPRESSION: Cardiomegaly with low lung volumes. Electronically Signed   By: Jerilynn Mages.  Shick M.D.   On: 05/15/2022 17:38   DG Foot Complete Right  Result Date: 05/14/2022 CLINICAL DATA:  Diabetic foot ulcer EXAM: RIGHT FOOT COMPLETE - 3+ VIEW COMPARISON:  RIGHT foot XRs, 12/18/2021. FINDINGS: Transmetatarsal amputations at the RIGHT foot. Enlarged soft tissue defect at the superomedial margin of the stump. Question demineralization and loss of cortication at the first metatarsal stump. 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 IMPRESSION: Enlarged soft tissue ulcer with question of demineralization and loss of cortication at the first metatarsal stump. Findings suspicious for osteomyelitis. Consider MR for further evaluation. Electronically Signed   By: Michaelle Birks M.D.   On: 05/14/2022 12:01   CT Angio Chest PE W and/or Wo Contrast  Result Date: 04/11/2022 CLINICAL DATA:  Shortness of breath. Morbid obesity high clinical probability for pulmonary embolism. EXAM: CT ANGIOGRAPHY CHEST WITH CONTRAST TECHNIQUE: Multidetector CT imaging of the chest was performed using the standard protocol during bolus administration of intravenous contrast. Multiplanar CT image reconstructions and MIPs were obtained to evaluate the vascular anatomy. RADIATION DOSE REDUCTION: This exam was performed according to the departmental dose-optimization program which includes automated exposure control, adjustment of the mA and/or kV according to patient size and/or use of iterative reconstruction technique. CONTRAST:  66m OMNIPAQUE IOHEXOL 350 MG/ML SOLN COMPARISON:  11/07/2020 FINDINGS: Cardiovascular: Satisfactory opacification of pulmonary arteries noted, and no pulmonary emboli identified. No evidence of thoracic aortic dissection or aneurysm. Aortic  and coronary atherosclerotic calcification noted. Mediastinum/Nodes: No masses or pathologically enlarged lymph nodes identified. Lungs/Pleura: No pulmonary mass, infiltrate, or effusion. Upper abdomen: No acute findings. Musculoskeletal: No suspicious bone lesions identified. Review of the MIP images confirms the above findings. IMPRESSION: No evidence of pulmonary embolism or other active disease. Aortic Atherosclerosis (ICD10-I70.0). Electronically Signed   By: JMarlaine HindM.D.   On: 04/11/2022 10:07   DG Chest Port 1 View  Result Date: 04/11/2022 CLINICAL DATA:  46year old female with history of shortness of breath. EXAM: PORTABLE CHEST 1 VIEW COMPARISON:  Chest x-ray 03/08/2022. FINDINGS: Lung volumes are low. Images under penetrated, limiting the diagnostic sensitivity and specificity of the examination. No consolidative airspace disease. No pleural effusions. No pneumothorax. No pulmonary nodule or mass noted. Pulmonary vasculature and the cardiomediastinal silhouette are within normal limits. IMPRESSION: 1. Low lung volumes without radiographic evidence of acute cardiopulmonary disease. Electronically Signed   By: DVinnie LangtonM.D.   On: 04/11/2022 06:20     Results/Tests Pending at Time of Discharge: None  Discharge Medications:  Allergies as of 05/25/2022       Reactions   Iodine Other (See Comments)   Kidney dysfunction   Kiwi Extract Shortness Of Breath, Swelling, Anaphylaxis   Morphine And Related Nausea And Vomiting   Nalbuphine Other (See Comments)   Hallucinations   Trental [pentoxifylline] Nausea And Vomiting   Toradol [ketorolac Tromethamine] Other (See Comments)   Feels like something is crawling on me   Morphine Nausea And Vomiting   Nubain [nalbuphine Hcl] Other (See Comments)   "FEELS LIKE SOMETHING CRAWLING ON ME"        Medication List     TAKE  these medications    Accu-Chek Guide test strip Generic drug: glucose blood USE T0 CHECK BLOOD SUGAR 3 TIMES  DAILY   Accu-Chek Softclix Lancets lancets Use to check blood glucose four times daily   Accu-Chek FastClix Lancets Misc USE DAILY AS INSTRUCTED   acetaminophen 325 MG tablet Commonly known as: TYLENOL Take 2 tablets (650 mg total) by mouth every 6 (six) hours as needed for moderate pain.   albuterol 108 (90 Base) MCG/ACT inhaler Commonly known as: VENTOLIN HFA INHALE 1 TO 2 PUFFS BY MOUTH EVERY SIX HOURS AS NEEDED FOR WHEEZING OR SHORTNESS OF BREATH What changed: See the new instructions.   amoxicillin-clavulanate 875-125 MG tablet Commonly known as: AUGMENTIN Take 1 tablet by mouth every 12 (twelve) hours for 7 doses.   aspirin EC 81 MG tablet Take 1 tablet (81 mg total) by mouth daily. Swallow whole.   Baqsimi One Pack 3 MG/DOSE Powd Generic drug: Glucagon Place 3 mg into the nose as needed (low blood sugar).   cetirizine 10 MG tablet Commonly known as: ZYRTEC Take 1 tablet (10 mg total) by mouth daily as needed for allergies.   Cholecalciferol 25 MCG (1000 UT) tablet Take 1 tablet (1,000 Units total) by mouth daily.   Dexcom G6 Sensor Misc Inject 1 applicator into the skin as directed. Change sensor every 10 days.   Dexcom G6 Transmitter Misc Inject 1 Device into the skin as directed. Reuse 8 times with sensor changes.   DULoxetine 30 MG capsule Commonly known as: Cymbalta Take 1 capsule (30 mg total) by mouth daily. X 1 week; then 60 mg daily- for knee and back/nerve pain What changed:  how much to take additional instructions   gabapentin 600 MG tablet Commonly known as: NEURONTIN Take 1.5 tablets (900 mg total) by mouth 3 (three) times daily. What changed: See the new instructions.   Glucagon Emergency 1 MG Kit Inject 1 mg into the muscle as needed for low blood sugar.   GNP UltiCare Pen Needles 32G X 4 MM Misc Generic drug: Insulin Pen Needle USE TO inject insulin 2 TIMES DAILY   losartan 50 MG tablet Commonly known as: COZAAR Take 1 tablet (50 mg  total) by mouth daily. Start taking on: May 26, 2022   NovoLOG FlexPen 100 UNIT/ML FlexPen Generic drug: insulin aspart Inject 20 Units into the skin 3 (three) times daily with meals. What changed:  how much to take additional instructions   nystatin powder Commonly known as: MYCOSTATIN/NYSTOP Apply 1 application. topically 3 (three) times daily.   ondansetron 4 MG tablet Commonly known as: Zofran Take 1 tablet (4 mg total) by mouth every 8 (eight) hours as needed for nausea or vomiting.   oxyCODONE-acetaminophen 7.5-325 MG tablet Commonly known as: PERCOCET Take 1 tablet by mouth every 6 (six) hours as needed for severe pain.   potassium chloride SA 20 MEQ tablet Commonly known as: Klor-Con M20 Take 1 tablet by mouth only on days you take Torsemide What changed:  how much to take how to take this when to take this additional instructions   prednisoLONE acetate 1 % ophthalmic suspension Commonly known as: PRED FORTE Place 1 drop into both eyes 4 (four) times daily.   ramelteon 8 MG tablet Commonly known as: ROZEREM Take 1 tablet (8 mg total) by mouth at bedtime as needed for sleep. What changed:  when to take this reasons to take this   Spiriva Respimat 1.25 MCG/ACT Aers Generic drug: Tiotropium Bromide Monohydrate Inhale 2  puffs into the lungs daily.   Symbicort 160-4.5 MCG/ACT inhaler Generic drug: budesonide-formoterol inhale 2 PUFFS into THE lungs 2 TIMES DAILY What changed:  how to take this when to take this additional instructions   torsemide 20 MG tablet Commonly known as: DEMADEX Take 2 tablets (40 mg total) by mouth daily as needed (swelling, weight gain, fluid overload). Take 35m in am and 421min pm What changed:  how much to take how to take this when to take this reasons to take this   Tresiba FlexTouch 200 UNIT/ML FlexTouch Pen Generic drug: insulin degludec Inject 46 Units into the skin 2 (two) times daily. What changed: how much to  take   Trulicity 3 MGJY/7.8GNopn Generic drug: Dulaglutide Inject 3 mg into the skin once a week. _0 /20/23 1426            Discharge Instructions: Please refer to Patient Instructions section of EMR for full details.  Patient was counseled important signs and symptoms that should prompt return to medical care, changes in medications, dietary instructions, activity restrictions, and follow up appointments.   Follow-Up Appointments: Future Appointments  Date Time Provider [place]6/27/2023  1:55 PM AlGenia DelMD [facility]6/28/2023  2:45 PM PaFelipa FurnaceDPM [facility]  08/30/2022 11:40 AM LoCourtney HeysMD CPR[facility] CPR    NoAlen BleacherMD 05/25/2022, 2:56 PM PGY-1, [place]

## 2022-05-25 NOTE — H&P (Signed)
Physical Medicine and Rehabilitation Admission H&P     CC: Debility secondary to surgical revision of right foot transmetatarsal amputation as well treatment for hyperglycemia   HPI: [name] is a 46 year old female who presented to the emergency department on the morning of 05/14/2022 complaining of fatigue and polyuria.  In addition, she noted worsening pain to her chronic right foot ulcer.  She reported she has had elevated blood sugars over the past several days and her endocrinologist encouraged her to go to the emergency department for further management.  Her serum glucose was 793.  She was started on insulin infusion, IVFs, dextrose. MRI obtained of right foot.  Podiatry, infectious disease and orthopedic surgery consultations obtained.  The patient was taken to the OR on 6/16 and underwent transmetatarsal amputation revision by Dr. Boneta Lucks. Incision closed with staples. Tolerated well.  Dr., Recommended a 7-day course of Augmentin postoperatively for the possibility remaining soft tissue infection.  He anion gap Improved, transitioned to subcutaneous insulin, she remained afebrile with tolerating carb modified diet.  PT and OT evaluations carried out. She reports her pain is under good control. She had OA/gout pain in her R knee, but this is improving. She The patient requires inpatient medicine and rehabilitation evaluations and services for ongoing dysfunction secondary to revision right transmetatarsal amputation.   Significant medical history includes obstructive sleep apnea on nighttime BiPAP, bipolar disorder, acute kidney injury atop chronic kidney disease stage III, previous left above-knee amputation.   Her daughter was in the room.    Review of Systems  Constitutional:  Negative for chills and fever.  HENT:  Negative for congestion and sore throat.   Eyes:  Negative for blurred vision and double vision.  Respiratory:  Negative for cough and sputum production.    Cardiovascular:  Negative for chest pain and palpitations.  Gastrointestinal:  Negative for constipation, nausea and vomiting.  Genitourinary:  Negative for dysuria and urgency.  Musculoskeletal:  Negative for back pain and neck pain.  Skin:  Negative for rash.  Neurological:  Negative for dizziness and headaches.        Past Medical History:  Diagnosis Date   Abscess of groin, left     Acute on chronic diastolic (congestive) heart failure ([place]) 02/05/2022   Acute on chronic respiratory failure with hypoxia ([name]) 04/11/2022   Acute osteomyelitis of ankle or foot, left ([place]) 01/31/2018   Acute respiratory failure with hypoxia and hypercarbia ([place]) 06/15/2020   Alveolar hypoventilation     Amputation stump infection ([place]) 04/09/2018   Anemia      not on iron pill   Asthma     Bipolar 2 disorder ([name])     Candidiasis of vagina 05/15/2022   Carpal tunnel syndrome on right      recurrent   Cellulitis 08/2010-08/2011   Chest pain with low risk for cardiac etiology 05/11/2021   Chronic pain     COPD (chronic obstructive pulmonary disease) ([name])      Symbicort daily and Proventil as needed   Costochondritis     De Quervain's tenosynovitis, bilateral 11/01/2015   Depression     Diabetes mellitus type II, uncontrolled 2000    Type 2, Uncontrolled.Takes Lantus daily.Fasting blood sugar runs 150   Diabetic ulcer of right foot ([place]) 12/19/2021   Drug-seeking behavior     Elevated troponin     Exposure to mold 04/21/2020   GERD (gastroesophageal reflux disease)      takes Pantoprazole and  Zantac daily   HLD (hyperlipidemia)      takes Atorvastatin daily   Hypertension      takes Lisinopril and Coreg daily   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[place]) 04/11/2019   Left shoulder pain 06/23/2019   Morbid obesity ([place])     Morbid obesity with BMI of 60.0-69.9, adult ([place]) 04/15/2021   Nocturia     Nonobstructive atherosclerosis of coronary artery 11/26/2020    11/26/20 Coronary angiogram: 1.  Mild diffuse proximal  LAD stenosis with no evidence of obstructive disease (20 to 30% diffuse stenosis);  2.  Left dominant circumflex with widely patent obtuse marginal branches and left PDA branch, no significant stenoses present  3.  Nondominant RCA with mild diffuse nonobstructive stenosis    OSA on CPAP     Peripheral neuropathy      takes Gabapentin daily   Pneumonia      "walking" several yrs ago and as a baby (12/05/2018)   Pneumonia due to COVID-19 virus 04/14/2021   Primary osteoarthritis of first carpometacarpal joint of left hand 07/30/2016   Rectal fissure     Restless leg     Right carpal tunnel syndrome 09/01/2011   SVT (supraventricular tachycardia) ([place])     Syncope 02/25/2016   Thrombocytosis 04/11/2022   Urinary frequency     Urinary incontinence 10/23/2020   UTI (urinary tract infection) 04/15/2021   Varicose veins      Right medial thigh and Left leg          Past Surgical History:  Procedure Laterality Date   AMPUTATION Left 02/01/2018    Procedure: LEFT FOURTH AND 5TH TOE RAY AMPUTATION;  Surgeon: [name], MD;  Location: [facility];  Service: Orthopedics;  Laterality: Left;   AMPUTATION Left 03/03/2018    Procedure: LEFT BELOW KNEE AMPUTATION;  Surgeon: [name], MD;  Location: [facility];  Service: Orthopedics;  Laterality: Left;   AMPUTATION Right 05/21/2022    Procedure: REVISION AMPUTATION RAY 1ST;  Surgeon: [name], DPM;  Location: [facility];  Service: Podiatry;  Laterality: Right;   CARPAL TUNNEL RELEASE Bilateral     CESAREAN SECTION   2007   CORONARY ANGIOGRAPHY N/A 11/26/2020    Procedure: CORONARY ANGIOGRAPHY;  Surgeon: Sherren Mocha, MD;  Location: [facility];  Service: Cardiovascular;  Laterality: N/A;   FOOT AMPUTATION Right     INCISION AND DRAINAGE ABSCESS Left 10/16/2021    Procedure: INCISION AND DRAINAGE ABSCESS;  Surgeon: Dwan Bolt, MD;  Location: [facility];  Service: General;  Laterality: Left;   INCISION AND DRAINAGE PERIRECTAL ABSCESS Left  05/18/2019    Procedure: IRRIGATION AND DEBRIDEMENT OF PANNIS ABSCESS, POSSIBLE DEBRIDEMENT OF BUTTOCK WOUND;  Surgeon: Donnie Mesa, MD;  Location: [facility];  Service: General;  Laterality: Left;   IRRIGATION AND DEBRIDEMENT BUTTOCKS Left 05/17/2019    Procedure: IRRIGATION AND DEBRIDEMENT BUTTOCKS;  Surgeon: Donnie Mesa, MD;  Location: [facility];  Service: General;  Laterality: Left;   KNEE ARTHROSCOPY Right 07/17/2010   LEFT HEART CATHETERIZATION WITH CORONARY ANGIOGRAM N/A 07/27/2012    Procedure: LEFT HEART CATHETERIZATION WITH CORONARY ANGIOGRAM;  Surgeon: Sherren Mocha, MD;  Location: [facility];  Service: Cardiovascular;  Laterality: N/A;   MASS EXCISION N/A 06/29/2013    Procedure:  WIDE LOCAL EXCISION OF POSTERIOR NECK ABSCESS;  Surgeon: Ralene Ok, MD;  Location: [facility];  Service: General;  Laterality: N/A;   REPAIR KNEE LIGAMENT Left      "fixed ligaments and  chipped patella"   right transmetatarsal amputation              Family History  Problem Relation Age of Onset   Diabetes Mother     Hyperlipidemia Mother     Depression Mother     GER disease Mother     Allergic rhinitis Mother     Restless legs syndrome Mother     Heart attack Paternal Uncle     Heart disease Paternal Grandmother     Heart attack Paternal Grandmother     Heart attack Paternal Grandfather     Heart disease Paternal Grandfather     Heart attack Father     Migraines Sister     Cancer Maternal Grandmother          COLON   Hypertension Maternal Grandmother     Hyperlipidemia Maternal Grandmother     Diabetes Maternal Grandmother     Other Maternal Grandfather          GUN SHOT   Anxiety disorder Sister     Asthma Child      Social History:  reports that she quit smoking about 16 years ago. Her smoking use included cigarettes. She has a 3.75 pack-year smoking history. She has never used smokeless tobacco. She reports that she does not currently use drugs. She reports that she does not drink  alcohol. Allergies:       Allergies  Allergen Reactions   Iodine Other (See Comments)      Kidney dysfunction   Kiwi Extract Shortness Of Breath, Swelling and Anaphylaxis   Morphine And Related Nausea And Vomiting   Nalbuphine Other (See Comments)      Hallucinations       Trental [Pentoxifylline] Nausea And Vomiting   Toradol [Ketorolac Tromethamine] Other (See Comments)      Feels like something is crawling on me   Morphine Nausea And Vomiting   Nubain [Nalbuphine Hcl] Other (See Comments)      "FEELS LIKE SOMETHING CRAWLING ON ME"          Medications Prior to Admission  Medication Sig Dispense Refill   albuterol (VENTOLIN HFA) 108 (90 Base) MCG/ACT inhaler INHALE 1 TO 2 PUFFS BY MOUTH EVERY SIX HOURS AS NEEDED FOR WHEEZING OR SHORTNESS OF BREATH (Patient taking differently: Inhale 1 puff into the lungs every 6 (six) hours as needed for shortness of breath.) 6.7 g 4   aspirin EC 81 MG tablet Take 1 tablet (81 mg total) by mouth daily. Swallow whole. 90 tablet 3   cetirizine (ZYRTEC) 10 MG tablet Take 1 tablet (10 mg total) by mouth daily as needed for allergies. 30 tablet 3   Cholecalciferol 1000 units tablet Take 1 tablet (1,000 Units total) by mouth daily. 90 tablet 0   DULoxetine (CYMBALTA) 30 MG capsule Take 1 capsule (30 mg total) by mouth daily. X 1 week; then 60 mg daily- for knee and back/nerve pain (Patient taking differently: Take 60 mg by mouth daily.) 60 capsule 5   gabapentin (NEURONTIN) 600 MG tablet TAKE 2 TABLETS BY MOUTH 3 TIMES DAILY (Patient taking differently: Take 1,200 mg by mouth in the morning, at noon, and at bedtime.) 180 tablet 2   Glucagon (BAQSIMI ONE PACK) 3 MG/DOSE POWD Place 3 mg into the nose as needed (low blood sugar).       Glucagon, rDNA, (GLUCAGON EMERGENCY) 1 MG KIT Inject 1 mg into the muscle as needed for low blood sugar.  insulin aspart (NOVOLOG FLEXPEN) 100 UNIT/ML FlexPen Inject 20 Units into the skin 3 (three) times daily with meals.  (Patient taking differently: Inject 30 Units into the skin 3 (three) times daily with meals. Sliding scale) 45 mL 3   insulin degludec (TRESIBA FLEXTOUCH) 200 UNIT/ML FlexTouch Pen Inject 46 Units into the skin 2 (two) times daily. (Patient taking differently: Inject 30 Units into the skin 2 (two) times daily.) 30 mL 3   nystatin (MYCOSTATIN/NYSTOP) powder Apply 1 application. topically 3 (three) times daily. 30 g 0   ondansetron (ZOFRAN) 4 MG tablet Take 1 tablet (4 mg total) by mouth every 8 (eight) hours as needed for nausea or vomiting. 60 tablet 1   oxyCODONE-acetaminophen (PERCOCET) 7.5-325 MG tablet Take 1 tablet by mouth every 6 (six) hours as needed for severe pain. 120 tablet 0   potassium chloride SA (KLOR-CON M20) 20 MEQ tablet Take 1 tablet (20 mEq total) by mouth daily. 30 tablet 6   prednisoLONE acetate (PRED FORTE) 1 % ophthalmic suspension Place 1 drop into both eyes 4 (four) times daily.       ramelteon (ROZEREM) 8 MG tablet Take 1 tablet (8 mg total) by mouth at bedtime. 90 tablet 0   SYMBICORT 160-4.5 MCG/ACT inhaler inhale 2 PUFFS into THE lungs 2 TIMES DAILY (Patient taking differently: 2 puffs every 6 (six) hours. Shortness of breath) 10.2 g 4   Tiotropium Bromide Monohydrate (SPIRIVA RESPIMAT) 1.25 MCG/ACT AERS Inhale 2 puffs into the lungs daily. 1 each 0   torsemide (DEMADEX) 20 MG tablet Take 83m in am and 450min pm (Patient taking differently: Take 40-80 mg by mouth See admin instructions. Take 4 tablets (80 mg) in am and 2 tablets (40 mg) in pm) 12017ablet 2   TRULICITY 3 MGCB/4.4HQOPN Inject 3 mg into the skin once a week. Sundays       Accu-Chek FastClix Lancets MISC USE DAILY AS INSTRUCTED 102 each 3   ACCU-CHEK GUIDE test strip USE T0 CHECK BLOOD SUGAR 3 TIMES DAILY 100 strip 5   Accu-Chek Softclix Lancets lancets Use to check blood glucose four times daily 100 each 12   Continuous Blood Gluc Sensor (DEXCOM G6 SENSOR) MISC Inject 1 applicator into the skin as  directed. Change sensor every 10 days. 3 each 11   Continuous Blood Gluc Transmit (DEXCOM G6 TRANSMITTER) MISC Inject 1 Device into the skin as directed. Reuse 8 times with sensor changes. 1 each 3   GNP ULTICARE PEN NEEDLES 32G X 4 MM MISC USE TO inject insulin 2 TIMES DAILY 100 each 2          Home: Home Living Family/patient expects to be discharged to:: Private residence Living Arrangements: Children, Non-relatives/Friends Available Help at Discharge: Family, Personal care attendant, Available 24 hours/day, Friend(s) Type of Home: Apartment Home Access: Level entry Home Layout: One level Bathroom Shower/Tub: Tub/shower unit, Sponge bathes at baseline [employer]Standard Bathroom Accessibility: Yes Home Equipment: RoConservation officer, nature2 wheels), BSC/3in1, Grab bars - tub/shower, Tub bench, Wheelchair - manual Additional Comments: has LLE prosthesis, not in room   Functional History: Prior Function Prior Level of Function : Needs assist Mobility Comments: on RW for short trips but main transport is power chair, uses LLE prosthesis regardless ADLs Comments: daughter and aide (5 days x8 hrs/day) assist with ADLs/IADLs, uses transportation services for getting to dr appts   Functional Status:  Mobility: Bed Mobility Overal bed mobility: Needs Assistance Bed Mobility: Supine to Sit, Sit  to Supine Rolling: Min guard Supine to sit: Min assist Sit to supine: Min assist General bed mobility comments: seated on EOB, Pt required min assistance with trunk control to move into sidelying for board placement and pad placement. Transfers Overall transfer level: Needs assistance Equipment used: Ambulation equipment used (sara stedy x 4 and sliding board.) Transfers: Sit to/from Stand, Bed to chair/wheelchair/BSC Sit to Stand: Mod assist, +2 physical assistance (Heavy cues for R foot placement to and from seated surface to keep R foot offloaded.  She did present with TDWB at times due to  fear but quickly returns to NWB.) Bed to/from chair/wheelchair/BSC transfer type:: Lateral/scoot transfer  Lateral/Scoot Transfers: Mod assist, +2 physical assistance General transfer comment: Pt required cues for board placement weight shifting and assistance to scoot to the L and R across the board to recliner and back to bed.  Pt required max VCs with poor sitting balance with dynamic movement.  Pt then performed sit to stand in sara stedy x3 trials and during final trial transferred to and from commode where she urinated and had BM.  Pt able to maintain static stand in sara stedy with foot placed by PTA prior to standing.  She had difficulty maintaining weight bearing on R side during transition. Ambulation/Gait General Gait Details: unable to attempt due to LLE being without prosthesis   ADL: ADL Overall ADL's : Needs assistance/impaired Eating/Feeding: Set up, Sitting Grooming: Minimal assistance, Sitting Upper Body Bathing: Maximal assistance, Sitting, Bed level Lower Body Bathing: Maximal assistance, Bed level, Sitting/lateral leans Upper Body Dressing : Maximal assistance, Sitting Upper Body Dressing Details (indicate cue type and reason): to don gown Lower Body Dressing: Maximal assistance, Bed level Lower Body Dressing Details (indicate cue type and reason): to donn left prosthesis Toilet Transfer: Maximal assistance, +2 for physical assistance Toilet Transfer Details (indicate cue type and reason): via lateral scoot Toileting- Clothing Manipulation and Hygiene: Total assistance, Bed level Toileting - Clothing Manipulation Details (indicate cue type and reason): for pericare Functional mobility during ADLs: Maximal assistance, +2 for physical assistance General ADL Comments: Donned left prosthesis seated onEOB   Cognition: Cognition Overall Cognitive Status: Within Functional Limits for tasks assessed Orientation Level: Oriented X4 Cognition Arousal/Alertness:  Awake/alert Behavior During Therapy: WFL for tasks assessed/performed Overall Cognitive Status: Within Functional Limits for tasks assessed Area of Impairment: Attention, Following commands, Memory, Safety/judgement, Awareness, Problem solving Current Attention Level: Selective Memory: Decreased short-term memory, Decreased recall of precautions Following Commands: Follows one step commands with increased time, Follows one step commands inconsistently Safety/Judgement: Decreased awareness of deficits, Decreased awareness of safety Awareness: Intellectual Problem Solving: Slow processing, Decreased initiation, Difficulty sequencing, Requires verbal cues, Requires tactile cues General Comments: alert and eager to participate. Fearful of attempting transfers but with education and encourage she performed well.   Physical Exam: Blood pressure (!) 157/76, pulse 95, temperature 98.2 F (36.8 C), temperature source Oral, resp. rate 20, height '5\' 2"'  (1.575 m), weight (!) 158.2 kg, SpO2 95 %. Physical Exam Constitutional:      General: She is not in acute distress.    Appearance: She is obese. She is not ill-appearing.  HENT:     Mouth/Throat:     Mouth: Mucous membranes are moist.  Eyes:     General: No scleral icterus.    Extraocular Movements: Extraocular movements intact.     Pupils: Pupils are equal, round, and reactive to light.  Cardiovascular:     Rate and Rhythm: Normal rate and regular rhythm.  Heart sounds: No murmur heard.    No friction rub. No gallop.  Pulmonary:     Effort: Pulmonary effort is normal. No respiratory distress.     Breath sounds: Normal breath sounds.  Abdominal:     General: Bowel sounds are normal. There is no distension.     Palpations: Abdomen is soft.     Tenderness: There is no abdominal tenderness.  Musculoskeletal:        General: No tenderness.     Cervical back: No tenderness.     Right lower leg: Edema present.     Comments: Right foot  surgical dressing dry and intact; left BKA well healed, shrinker in place L BKA socket appears loose with increased space around residual limb Tone normal No ROM deficits noted Mild swelling R knee, no redness, mild joint line tenderness  Neurological:     General: No focal deficit present.     Mental Status: She is alert and oriented to person, place, and time.     Sensory: No sensory deficit.     Coordination: Coordination normal.     Comments: Follows commands and answers questions Strength 5/5 in b/l UE Strength 4/5 R hip flexion and knee extension Strength 5/5 L hip flexion and 4+/5 knee extension   Psychiatric:        Mood and Affect: Mood normal.        Thought Content: Thought content normal.        Lab Results Last 48 Hours        Results for orders placed or performed during the hospital encounter of 05/14/22 (from the past 48 hour(s))  Glucose, capillary     Status: Abnormal    Collection Time: 05/23/22  3:53 PM  Result Value Ref Range    Glucose-Capillary 64 (L) 70 - 99 mg/dL      Comment: Glucose reference range applies only to samples taken after fasting for at least 8 hours.  Glucose, capillary     Status: Abnormal    Collection Time: 05/23/22  4:23 PM  Result Value Ref Range    Glucose-Capillary 64 (L) 70 - 99 mg/dL      Comment: Glucose reference range applies only to samples taken after fasting for at least 8 hours.  Glucose, capillary     Status: Abnormal    Collection Time: 05/23/22  6:13 PM  Result Value Ref Range    Glucose-Capillary 137 (H) 70 - 99 mg/dL      Comment: Glucose reference range applies only to samples taken after fasting for at least 8 hours.  Glucose, capillary     Status: Abnormal    Collection Time: 05/23/22  8:51 PM  Result Value Ref Range    Glucose-Capillary 165 (H) 70 - 99 mg/dL      Comment: Glucose reference range applies only to samples taken after fasting for at least 8 hours.  Basic metabolic panel     Status: Abnormal     Collection Time: 05/24/22  3:28 AM  Result Value Ref Range    Sodium 139 135 - 145 mmol/L    Potassium 4.8 3.5 - 5.1 mmol/L    Chloride 106 98 - 111 mmol/L    CO2 26 22 - 32 mmol/L    Glucose, Bld 164 (H) 70 - 99 mg/dL      Comment: Glucose reference range applies only to samples taken after fasting for at least 8 hours.    BUN 34 (H) 6 - 20  mg/dL    Creatinine, Ser 1.28 (H) 0.44 - 1.00 mg/dL    Calcium 8.8 (L) 8.9 - 10.3 mg/dL    GFR, Estimated 53 (L) >60 mL/min      Comment: (NOTE) Calculated using the CKD-EPI Creatinine Equation (2021)      Anion gap 7 5 - 15      Comment: Performed at [facility] [address] [place], [place] [address]  CBC     Status: Abnormal    Collection Time: 05/24/22  3:28 AM  Result Value Ref Range    WBC 10.9 (H) 4.0 - 10.5 K/uL    RBC 3.23 (L) 3.87 - 5.11 MIL/uL    Hemoglobin 9.5 (L) 12.0 - 15.0 g/dL    HCT 30.5 (L) 36.0 - 46.0 %    MCV 94.4 80.0 - 100.0 fL    MCH 29.4 26.0 - 34.0 pg    MCHC 31.1 30.0 - 36.0 g/dL    RDW 14.8 11.5 - 15.5 %    Platelets 443 (H) 150 - 400 K/uL    nRBC 0.0 0.0 - 0.2 %      Comment: Performed at [facility], [place] [address], [place], [place] [address]  Glucose, capillary     Status: Abnormal    Collection Time: 05/24/22  7:28 AM  Result Value Ref Range    Glucose-Capillary 136 (H) 70 - 99 mg/dL      Comment: Glucose reference range applies only to samples taken after fasting for at least 8 hours.  Glucose, capillary     Status: Abnormal    Collection Time: 05/24/22 11:41 AM  Result Value Ref Range    Glucose-Capillary 162 (H) 70 - 99 mg/dL      Comment: Glucose reference range applies only to samples taken after fasting for at least 8 hours.  Glucose, capillary     Status: Abnormal    Collection Time: 05/24/22  4:04 PM  Result Value Ref Range    Glucose-Capillary 247 (H) 70 - 99 mg/dL      Comment: Glucose reference range applies only to samples taken after fasting for at least 8 hours.  Glucose,  capillary     Status: Abnormal    Collection Time: 05/24/22  9:22 PM  Result Value Ref Range    Glucose-Capillary 160 (H) 70 - 99 mg/dL      Comment: Glucose reference range applies only to samples taken after fasting for at least 8 hours.  Basic metabolic panel     Status: Abnormal    Collection Time: 05/25/22  4:01 AM  Result Value Ref Range    Sodium 140 135 - 145 mmol/L    Potassium 4.5 3.5 - 5.1 mmol/L    Chloride 105 98 - 111 mmol/L    CO2 27 22 - 32 mmol/L    Glucose, Bld 111 (H) 70 - 99 mg/dL      Comment: Glucose reference range applies only to samples taken after fasting for at least 8 hours.    BUN 32 (H) 6 - 20 mg/dL    Creatinine, Ser 1.53 (H) 0.44 - 1.00 mg/dL    Calcium 9.0 8.9 - 10.3 mg/dL    GFR, Estimated 43 (L) >60 mL/min      Comment: (NOTE) Calculated using the CKD-EPI Creatinine Equation (2021)      Anion gap 8 5 - 15      Comment: Performed at [facility],  [place] [address]  CBC     Status: Abnormal    Collection Time: 05/25/22  4:01 AM  Result Value Ref Range    WBC 13.9 (H) 4.0 - 10.5 K/uL    RBC 3.32 (L) 3.87 - 5.11 MIL/uL    Hemoglobin 9.7 (L) 12.0 - 15.0 g/dL    HCT 31.2 (L) 36.0 - 46.0 %    MCV 94.0 80.0 - 100.0 fL    MCH 29.2 26.0 - 34.0 pg    MCHC 31.1 30.0 - 36.0 g/dL    RDW 14.7 11.5 - 15.5 %    Platelets 415 (H) 150 - 400 K/uL    nRBC 0.1 0.0 - 0.2 %      Comment: Performed at [facility], [place] [address] [place], [place] [address]  Glucose, capillary     Status: None    Collection Time: 05/25/22  8:20 AM  Result Value Ref Range    Glucose-Capillary 88 70 - 99 mg/dL      Comment: Glucose reference range applies only to samples taken after fasting for at least 8 hours.  Glucose, capillary     Status: Abnormal    Collection Time: 05/25/22 11:52 AM  Result Value Ref Range    Glucose-Capillary 108 (H) 70 - 99 mg/dL      Comment: Glucose reference range applies only to samples taken after fasting for at  least 8 hours.    *Note: Due to a large number of results and/or encounters for the requested time period, some results have not been displayed. A complete set of results can be found in Results Review.      Imaging Results (Last 48 hours)  No results found.         Blood pressure (!) 157/76, pulse 95, temperature 98.2 F (36.8 C), temperature source Oral, resp. rate 20, height '5\' 2"'  (1.575 m), weight (!) 158.2 kg, SpO2 95 %.   Medical Problem List and Plan: 1. Functional deficits secondary to revision to right foot ray amputation due to osteomyelitis R foot and recent admission for HHS treatment             -patient may  shower please cover surgical incision             -ELOS/Goals: ELOS 7 to 10 days             -Admit to CIR 2.  Antithrombotics: -DVT/anticoagulation:  Pharmaceutical: Lovenox 80 mg daily             -antiplatelet therapy: Aspirin 81 mg daily 3. Pain Management: Tylenol as needed --Neurontin 900 mg 3 times daily, Duloxetine 45m daily             --Oxycodone 7.5/325 every 6 hours as needed 4. Mood/Sleep: LCSW to evaluate and provide emotional support             -Bipolar disorder: Continue Cymbalta DR 60 mg daily             -antipsychotic agents: n/a 5. Neuropsych/cognition: This patient is capable of making decisions on her own behalf. 6. Skin/Wound Care: Skin care checks             --monitor right foot surgical incision             --continue Augmentin through Friday 6/23 7. Fluids/Electrolytes/Nutrition: Routine I's and O's and follow-up chemistries 8: Gout, acute flare right knee: improving, Continue colchicine 0.3 mg daily 9: Revison of right first ray amputation  1/16>>NWB             --follow-up after discharge with podiatry, Dr. Posey Pronto             -Continue Augmentin until 6/23             --dressing changes as per podiatry 10: DM-2: continue sliding scale insulin with meals and at bedtime             --continue meal coverage NovoLog 20 units              --continue Semglee 50 units twice daily             --follow-up endocrinology/Dr. [place] 11: Diastolic heart failure: stable, monitor symptoms.  Daily weight.             -She takes home torsemide, it was held due to AKI. Consider restarted when appropriate  12: Hypertension: continue losartan 50 mg daily 13: COPD: continue Yupelri nebulizer daily, Dulera inhaler twice daily 14: Acute kidney injury/CKD stage III: monitor serum BUN and creatinine. Last Cr 1.53 15: Leukocytosis: continue Augmentin and monitor CBC, incision, fever 16: Anemia: normal red blood cell indices.  Follow-up CBC. Last HGB 9.7 17: Left BKA four years ago; requesting [facility] referral for new shrinker 18: OSA: uses cpap at night with O2 2L, continue     I have personally performed a face to face diagnostic evaluation of this patient and formulated the key components of the plan.  Additionally, I have personally reviewed laboratory data, imaging studies, as well as relevant notes and concur with the physician assistant's documentation above.   The patient's status has not changed from the original H&P.  Any changes in documentation from the acute care chart have been noted above.   Jennye Boroughs, MD, FAAPMR     Barbie Banner, PA-C 05/25/2022

## 2022-05-25 NOTE — PMR Pre-admission (Signed)
PMR Admission Coordinator Pre-Admission Assessment  Patient: [name] is an 46 y.o., female MRN: [id number] DOB: 07-12-1976 Height: _0  (157.5 cm) Weight: (!) 158.2 kg  Insurance Information HMO:     PPO:      PCP:      IPA:      80/20:      OTHER:  PRIMARY: Medicaid [employer]      Policy#: [id number]      Subscriber: pt Benefits:  Phone #: passport one source online     Name: 6/19 Eff. Date: active 6/20     Deduct:       Out of Pocket Max:        CIR: per Medicaid guidelines      SNF: per medicaid Outpatient: per [facility] Health: per medicaid      DME: per medicaid Providers: in network  SECONDARY: none  Financial Counselor:       Phone#:   [employer]" for patients in Inpatient Rehabilitation Facilities with attached "Privacy Act [place] Records" was provided and verbally reviewed with: N/A  Emergency Contact Information Contact Information     Name Relation Home Work Mobile   [place] Mother   [phone]   [place] Daughter   [phone]   Alben Spittle   [phone]      Current Medical History  Patient Admitting Diagnosis: Debility   History of Present Illness: 46 year old female with history of type 2 DM, neuropathy, COPD, Left AKA, CKD 3, depression, bipolar and OSA on BiPAP at HS. Presented on 05/14/22 with hyperglycemia over 600 in the past week. Polyuria, nausea and emesis. Lab findings consistent with hyperosmolar hyperglycemic state in the setting of type 2 DM.  Treated for HHS and now continues on Semglee, Novolog and SSI. AKI treated with fluid resuscitation , avoid nephrotoxic agents and monitoring of daily BMP labs. Diastolic heart failure with use of home torsemide once kidney function stabilized. Noted HTN. Has been on Coreg in the past but she discontinued on her own pta. Began Losartan. Treated with IV Micafungin for history of Candida glabrata infection of right stump infection.    Osteomyelitis of right foot. Podiatry consulted with revision of right first ray amputation. Placed on Augmentin. NWB of RLE. Percocet for pain. Colchicine for right knee gout.   Patient's medical record from [facility]  has been reviewed by the rehabilitation admission coordinator and physician.  Past Medical History  Past Medical History:  Diagnosis Date   Abscess of groin, left    Acute on chronic diastolic (congestive) heart failure ([place]) 02/05/2022   Acute on chronic respiratory failure with hypoxia ([name]) 04/11/2022   Acute osteomyelitis of ankle or foot, left ([place]) 01/31/2018   Acute respiratory failure with hypoxia and hypercarbia ([name]) 06/15/2020   Alveolar hypoventilation    Amputation stump infection ([place]) 04/09/2018   Anemia    not on iron pill   Asthma    Bipolar 2 disorder ([name])    Candidiasis of vagina 05/15/2022   Carpal tunnel syndrome on right    recurrent   Cellulitis 08/2010-08/2011   Chest pain with low risk for cardiac etiology 05/11/2021   Chronic pain    COPD (chronic obstructive pulmonary disease) ([name])    Symbicort daily and Proventil as needed   Costochondritis    De Quervain's tenosynovitis, bilateral 11/01/2015   Depression    Diabetes mellitus type II, uncontrolled 2000   Type 2, Uncontrolled.Takes Lantus daily.Fasting blood sugar runs  150   Diabetic ulcer of right foot ([place]) 12/19/2021   Drug-seeking behavior    Elevated troponin    Exposure to mold 04/21/2020   GERD (gastroesophageal reflux disease)    takes Pantoprazole and Zantac daily   HLD (hyperlipidemia)    takes Atorvastatin daily   Hypertension    takes Lisinopril and Coreg daily   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[place]) 04/11/2019   Left shoulder pain 06/23/2019   Morbid obesity ([place])    Morbid obesity with BMI of 60.0-69.9, adult ([place]) 04/15/2021   Nocturia    Nonobstructive atherosclerosis of coronary artery 11/26/2020   11/26/20 Coronary angiogram: 1.  Mild diffuse proximal LAD stenosis with  no evidence of obstructive disease (20 to 30% diffuse stenosis);  2.  Left dominant circumflex with widely patent obtuse marginal branches and left PDA branch, no significant stenoses present  3.  Nondominant RCA with mild diffuse nonobstructive stenosis    OSA on CPAP    Peripheral neuropathy    takes Gabapentin daily   Pneumonia    "walking" several yrs ago and as a baby (12/05/2018)   Pneumonia due to COVID-19 virus 04/14/2021   Primary osteoarthritis of first carpometacarpal joint of left hand 07/30/2016   Rectal fissure    Restless leg    Right carpal tunnel syndrome 09/01/2011   SVT (supraventricular tachycardia) ([place])    Syncope 02/25/2016   Thrombocytosis 04/11/2022   Urinary frequency    Urinary incontinence 10/23/2020   UTI (urinary tract infection) 04/15/2021   Varicose veins    Right medial thigh and Left leg    Has the patient had major surgery during 100 days prior to admission? Yes  Family History   family history includes Allergic rhinitis in her mother; Anxiety disorder in her sister; Asthma in her child; Cancer in her maternal grandmother; Depression in her mother; Diabetes in her maternal grandmother and mother; GER disease in her mother; Heart attack in her father, paternal grandfather, paternal grandmother, and paternal uncle; Heart disease in her paternal grandfather and paternal grandmother; Hyperlipidemia in her maternal grandmother and mother; Hypertension in her maternal grandmother; Migraines in her sister; Other in her maternal grandfather; Restless legs syndrome in her mother.  Current Medications  Current Facility-Administered Medications:    acetaminophen (TYLENOL) tablet 650 mg, 650 mg, Oral, Q6H PRN, [name], DPM, 650 mg at 05/20/22 1103   albuterol (PROVENTIL) (2.5 MG/3ML) 0.083% nebulizer solution 3 mL, 3 mL, Inhalation, Q6H PRN, [name], DPM   amoxicillin-clavulanate (AUGMENTIN XR) 1000-62.5 MG per 12 hr tablet 2 tablet, 2 tablet, Oral, Q12H,  Alen Bleacher, MD, 2 tablet at 05/25/22 0272   aspirin EC tablet 81 mg, 81 mg, Oral, Daily, [name], DPM, 81 mg at 05/25/22 1000   Chlorhexidine Gluconate Cloth 2 % PADS 6 each, 6 each, Topical, Q0600, [name], DPM, 6 each at 05/24/22 1132   colchicine tablet 0.3 mg, 0.3 mg, Oral, Daily, Zola Button, MD, 0.3 mg at 05/25/22 1048   dextrose 50 % solution 0-50 mL, 0-50 mL, Intravenous, PRN, [name], DPM   DULoxetine (CYMBALTA) DR capsule 60 mg, 60 mg, Oral, Daily, [name], DPM, 60 mg at 05/25/22 1000   enoxaparin (LOVENOX) injection 80 mg, 80 mg, Subcutaneous, Q24H, [name], DPM, 80 mg at 05/25/22 1001   gabapentin (NEURONTIN) tablet 900 mg, 900 mg, Oral, TID, [name], DPM, 900 mg at 05/25/22 1000   insulin aspart (novoLOG) injection 0-20 Units, 0-20  Units, Subcutaneous, TID WC, [name], DPM, 7 Units at 05/24/22 1755   insulin aspart (novoLOG) injection 0-5 Units, 0-5 Units, Subcutaneous, QHS, [name], DPM, 2 Units at 05/20/22 2120   insulin aspart (novoLOG) injection 20 Units, 20 Units, Subcutaneous, TID WC, Wells Guiles, DO, 20 Units at 05/25/22 1322   insulin glargine-yfgn (SEMGLEE) injection 50 Units, 50 Units, Subcutaneous, BID, [name], DPM, 50 Units at 05/25/22 0959   losartan (COZAAR) tablet 50 mg, 50 mg, Oral, Daily, Alen Bleacher, MD, 50 mg at 05/25/22 1000   MEDLINE mouth rinse, 15 mL, Mouth Rinse, BID, Boneta Lucks P, DPM, 15 mL at 05/23/22 1051   mometasone-formoterol (DULERA) 200-5 MCG/ACT inhaler 2 puff, 2 puff, Inhalation, BID, [name], DPM, 2 puff at 05/20/22 0802   oxyCODONE-acetaminophen (PERCOCET) 7.5-325 MG per tablet 1 tablet, 1 tablet, Oral, Q6H PRN, [name], DPM, 1 tablet at 05/24/22 1532   prednisoLONE acetate (PRED FORTE) 1 % ophthalmic suspension 1 drop, 1 drop, Both Eyes, QID, [name], DPM, 1 drop at 05/18/22 2012   revefenacin (YUPELRI) nebulizer solution 175 mcg, 175 mcg, Nebulization,  Daily, [name], DPM, 175 mcg at 05/20/22 0802   sodium chloride flush (NS) 0.9 % injection 10-40 mL, 10-40 mL, Intracatheter, PRN, [name], DPM, 10 mL at 05/22/22 6546  Patients Current Diet:  Diet Order             Diet Carb Modified Fluid consistency: Thin; Room service appropriate? Yes  Diet effective now                  Precautions / Restrictions Precautions Precautions: Other (comment), Fall Precaution Comments: hx of L BKA, prosthesis in room Restrictions Weight Bearing Restrictions: Yes RLE Weight Bearing: Non weight bearing Other Position/Activity Restrictions: LLE prosthetic in room   Has the patient had 2 or more falls or a fall with injury in the past year? No  Prior Activity Level Community (5-7x/wk): power wheelchair or whellchair for majority. Could use LE prosthesis and RW fo rshort distances  Prior Functional Level Self Care: Did the patient need help bathing, dressing, using the toilet or eating? Needed some help  Indoor Mobility: Did the patient need assistance with walking from room to room (with or without device)? Needed some help  Stairs: Did the patient need assistance with internal or external stairs (with or without device)? Needed some help  Functional Cognition: Did the patient need help planning regular tasks such as shopping or remembering to take medications? Independent  Patient Information Are you of Hispanic, Latino/a,or Spanish origin?: A. No, not of Hispanic, Latino/a, or Spanish origin What is your race?: A. White Do you need or want an interpreter to communicate with a doctor or health care staff?: 0. No  Patient's Response To:  Health Literacy and Transportation Is the patient able to respond to health literacy and transportation needs?: Yes Health Literacy - How often do you need to have someone help you when you read instructions, pamphlets, or other written material from your doctor or pharmacy?: Never In the past  12 months, has lack of transportation kept you from medical appointments or from getting medications?: No In the past 12 months, has lack of transportation kept you from meetings, work, or from getting things needed for daily living?: No  Development worker, international aid / [place] Devices/Equipment: CBG Meter, Oxygen, Wheelchair Home Equipment: Conservation officer, nature (2 wheels), BSC/3in1, Grab bars - tub/shower, Tub  bench, Wheelchair - manual  Prior Device Use: Indicate devices/aids used by the patient prior to current illness, exacerbation or injury? Motorized wheelchair or scooter, Environmental consultant, and Orthotics/Prosthetics  Current Functional Level Cognition  Overall Cognitive Status: Within Functional Limits for tasks assessed Current Attention Level: Selective Orientation Level: Oriented X4 Following Commands: Follows one step commands with increased time, Follows one step commands inconsistently Safety/Judgement: Decreased awareness of deficits, Decreased awareness of safety General Comments: alert and eager to participate. Fearful of attempting transfers but with education and encouragement she performed well.    Extremity Assessment (includes Sensation/Coordination)  Upper Extremity Assessment: Overall WFL for tasks assessed  Lower Extremity Assessment: RLE deficits/detail, LLE deficits/detail RLE Deficits / Details: R transmetatarsal amputation RLE: Unable to fully assess due to pain RLE Coordination: decreased gross motor LLE Deficits / Details: L AKA LLE Coordination: decreased gross motor    ADLs  Overall ADL's : Needs assistance/impaired Eating/Feeding: Set up, Sitting Grooming: Minimal assistance, Sitting Upper Body Bathing: Maximal assistance, Sitting, Bed level Lower Body Bathing: Maximal assistance, Bed level, Sitting/lateral leans Upper Body Dressing : Maximal assistance, Sitting Upper Body Dressing Details (indicate cue type and reason): to don gown Lower Body Dressing:  Maximal assistance, Bed level Lower Body Dressing Details (indicate cue type and reason): to donn left prosthesis Toilet Transfer: Maximal assistance, +2 for physical assistance Toilet Transfer Details (indicate cue type and reason): via lateral scoot Toileting- Clothing Manipulation and Hygiene: Total assistance, Bed level Toileting - Clothing Manipulation Details (indicate cue type and reason): for pericare Functional mobility during ADLs: Maximal assistance, +2 for physical assistance General ADL Comments: Donned left prosthesis seated onEOB    Mobility  Overal bed mobility: Needs Assistance Bed Mobility: Sit to Sidelying, Sidelying to Sit Rolling: Min guard Sidelying to sit: Min assist Supine to sit: Min assist Sit to supine: Min assist Sit to sidelying: Min guard General bed mobility comments: seated on EOB, Pt required min assistance with trunk control to move into sidelying for board placement and pad placement.    Transfers  Overall transfer level: Needs assistance Equipment used: Sliding board Transfers: Bed to chair/wheelchair/BSC Sit to Stand: Mod assist, +2 physical assistance (Heavy cues for R foot placement to and from seated surface to keep R foot offloaded.  She did present with TDWB at times due to fear but quickly returns to NWB.) Bed to/from chair/wheelchair/BSC transfer type:: Lateral/scoot transfer  Lateral/Scoot Transfers: Min assist, Mod assist, +2 physical assistance, With slide board General transfer comment: Pt required cues for board placement weight shifting and assistance to scoot to the L across the board to [facility] (with minA +2) and from [facility] toward her L side for back to bed (with modA +2 to slightly higher bed surface). Pt required max VCs with poor sitting balance with dynamic movement. Defer standing for safety due to pt poor compliance with RLE NWB restrictions during previous session.    Ambulation / Gait / Stairs / Wheelchair Mobility   Ambulation/Gait General Gait Details: unable to attempt due to LLE being without prosthesis Wheelchair Mobility Wheelchair mobility: Yes Wheelchair propulsion: Both upper extremities Wheelchair parts: Needs assistance Distance: 100 Wheelchair Assistance Details (indicate cue type and reason): minA at times to assist pt while propelling wheelchair and min/modA while pt turning chair to maintain pt momentum; multiple brief rest breaks 2/2 pt UE fatigue and DOE to 2/4. SpO2/HR WFL. Mod cues for proper grip on wheel rails, technique to propel/turn chair and for energy conservation.    Posture /  Balance Dynamic Sitting Balance Sitting balance - Comments: sitting on EOB upon entry, with lateral leans to reach pt unsteady needing up to minA for lateral leans with elbow taps to sit back upright Balance Overall balance assessment: Needs assistance Sitting-balance support: No upper extremity supported Sitting balance-Leahy Scale: Fair Sitting balance - Comments: sitting on EOB upon entry, with lateral leans to reach pt unsteady needing up to minA for lateral leans with elbow taps to sit back upright Standing balance-Leahy Scale: Poor Standing balance comment: defer for safety, NWB RLE    Special needs/care consideration BiPAP at HS Hgb A1c 14.9   Previous Home Environment  Living Arrangements:  (2 year old daughter, friend and her 32 year old son)  Lives With: Family, Friend(s) (Friend moved in a month ago) Available Help at Discharge: Family, Personal care attendant, Available 24 hours/day, Friend(s) Type of Home: Apartment Home Layout: One level Home Access: Level entry Bathroom Shower/Tub: Tub/shower unit, Sponge bathes at baseline [employer]: Standard Bathroom Accessibility: Yes How Accessible: Accessible via walker Home Care Services: Yes Type of Home Care Services: Homehealth aide (CAPS aide) Additional Comments: LLE prosthesis  Discharge Living Setting Plans for Discharge  Living Setting: Patient's home, Apartment, Lives with (comment) (45 year old daughter and friend with friend's 9 year old son) Type of Home at Discharge: Apartment Discharge Home Layout: One level Discharge Home Access: Level entry Discharge Bathroom Shower/Tub: Tub/shower unit (sponge bathes at baseline) Discharge Bathroom Toilet: Standard Discharge Bathroom Accessibility: Yes How Accessible: Accessible via walker Does the patient have any problems obtaining your medications?: No  Social/Family/Support Systems Patient Roles: Parent Contact Information: mother and sister Anticipated Caregiver: friend and 65 year old daughter Anticipated Caregiver's Contact Information: see contacts Ability/Limitations of Caregiver: friend lives with her, 67 year old daughter Caregiver Availability: 24/7 Discharge Plan Discussed with Primary Caregiver: Yes Is Caregiver In Agreement with Plan?: Yes Does Caregiver/Family have Issues with Lodging/Transportation while Pt is in Rehab?: No  Goals Patient/Family Goal for Rehab: supervision to min assist with PT and PT at wheelchair level Expected length of stay: ELOS 7 to 10 days Additional Information: Unable to get power wheelchair to hospital Pt/Family Agrees to Admission and willing to participate: Yes Program Orientation Provided & Reviewed with Pt/Caregiver Including Roles  & Responsibilities: Yes  Decrease burden of Care through IP rehab admission: n/a  Possible need for SNF placement upon discharge: not anticipated  Patient Condition: I have reviewed medical records from [facility] spoken with CM, and patient and daughter. I met with patient at the bedside for inpatient rehabilitation assessment.  Patient will benefit from ongoing PT and OT, can actively participate in 3 hours of therapy a day 5 days of the week, and can make measurable gains during the admission.  Patient will also benefit from the coordinated team approach during an  Inpatient Acute Rehabilitation admission.  The patient will receive intensive therapy as well as Rehabilitation physician, nursing, social worker, and care management interventions.  Due to bladder management, bowel management, safety, skin/wound care, disease management, medication administration, pain management, and patient education the patient requires 24 hour a day rehabilitation nursing.  The patient is currently mod assist overall with mobility and basic ADLs.  Discharge setting and therapy post discharge at home with home health is anticipated.  Patient has agreed to participate in the Acute Inpatient Rehabilitation Program and will admit today.  Preadmission Screen Completed By:  Cleatrice Burke, 05/25/2022 1:33 PM ______________________________________________________________________   Discussed status with  Dr. Curlene Dolphin on 05/25/22 at 1346 and received approval for admission today.  Admission Coordinator:  Cleatrice Burke, RN, time  1346 Date 05/25/22   Assessment/Plan: Diagnosis: revision to right foot ray amputation and recent admission for HHS treatment Does the need for close, 24 hr/day Medical supervision in concert with the patient's rehab needs make it unreasonable for this patient to be served in a less intensive setting? Yes Co-Morbidities requiring supervision/potential complications: Gout, DM-2, diastoic heart failure, HTN, COPD Due to bladder management, bowel management, safety, skin/wound care, disease management, medication administration, pain management, and patient education, does the patient require 24 hr/day rehab nursing? Yes Does the patient require coordinated care of a physician, rehab nurse, PT, OT, and SLP to address physical and functional deficits in the context of the above medical diagnosis(es)? Yes Addressing deficits in the following areas: balance, endurance, locomotion, strength, transferring, bowel/bladder control, bathing, dressing,  feeding, grooming, and toileting Can the patient actively participate in an intensive therapy program of at least 3 hrs of therapy 5 days a week? Yes The potential for patient to make measurable gains while on inpatient rehab is good Anticipated functional outcomes upon discharge from inpatient rehab: supervision and min assist PT, supervision and min assist OT, n/a SLP Estimated rehab length of stay to reach the above functional goals is: 7-10 days Anticipated discharge destination: Home 10. Overall Rehab/Functional Prognosis: good   MD Signature: Jennye Boroughs

## 2022-05-26 LAB — CBC WITH DIFFERENTIAL/PLATELET
Abs Immature Granulocytes: 0 10*3/uL (ref 0.00–0.07)
Basophils Absolute: 0 10*3/uL (ref 0.0–0.1)
Basophils Relative: 0 %
Eosinophils Absolute: 0.1 10*3/uL (ref 0.0–0.5)
Eosinophils Relative: 1 %
HCT: 32.1 % — ABNORMAL LOW (ref 36.0–46.0)
Hemoglobin: 10.2 g/dL — ABNORMAL LOW (ref 12.0–15.0)
Lymphocytes Relative: 29 %
Lymphs Abs: 3.2 10*3/uL (ref 0.7–4.0)
MCH: 29.3 pg (ref 26.0–34.0)
MCHC: 31.8 g/dL (ref 30.0–36.0)
MCV: 92.2 fL (ref 80.0–100.0)
Monocytes Absolute: 0.4 10*3/uL (ref 0.1–1.0)
Monocytes Relative: 4 %
Neutro Abs: 7.3 10*3/uL (ref 1.7–7.7)
Neutrophils Relative %: 66 %
Platelets: 407 10*3/uL — ABNORMAL HIGH (ref 150–400)
RBC: 3.48 MIL/uL — ABNORMAL LOW (ref 3.87–5.11)
RDW: 14.7 % (ref 11.5–15.5)
WBC: 11 10*3/uL — ABNORMAL HIGH (ref 4.0–10.5)
nRBC: 0 % (ref 0.0–0.2)
nRBC: 0 /100 WBC

## 2022-05-26 LAB — COMPREHENSIVE METABOLIC PANEL
ALT: 19 U/L (ref 0–44)
AST: 17 U/L (ref 15–41)
Albumin: 2.3 g/dL — ABNORMAL LOW (ref 3.5–5.0)
Alkaline Phosphatase: 251 U/L — ABNORMAL HIGH (ref 38–126)
Anion gap: 6 (ref 5–15)
BUN: 32 mg/dL — ABNORMAL HIGH (ref 6–20)
CO2: 26 mmol/L (ref 22–32)
Calcium: 8.8 mg/dL — ABNORMAL LOW (ref 8.9–10.3)
Chloride: 107 mmol/L (ref 98–111)
Creatinine, Ser: 1.51 mg/dL — ABNORMAL HIGH (ref 0.44–1.00)
GFR, Estimated: 43 mL/min — ABNORMAL LOW (ref >60)
Glucose, Bld: 92 mg/dL (ref 70–99)
Potassium: 4.6 mmol/L (ref 3.5–5.1)
Sodium: 139 mmol/L (ref 135–145)
Total Bilirubin: 0.3 mg/dL (ref 0.3–1.2)
Total Protein: 7.8 g/dL (ref 6.5–8.1)

## 2022-05-26 LAB — GLUCOSE, CAPILLARY
Glucose-Capillary: 92 mg/dL (ref 70–99)
Glucose-Capillary: 97 mg/dL (ref 70–99)
Glucose-Capillary: 111 mg/dL — ABNORMAL HIGH (ref 70–99)
Glucose-Capillary: 87 mg/dL (ref 70–99)

## 2022-05-26 MED ORDER — TORSEMIDE 20 MG PO TABS
20.0000 mg | ORAL_TABLET | Freq: Two times a day (BID) | ORAL | Status: DC
  Administered 2022-05-26 – 2022-05-27 (×3): 20 mg via ORAL
  Filled 2022-05-26 (×3): qty 1

## 2022-05-26 MED ORDER — MELATONIN 5 MG PO TABS
10.0000 mg | ORAL_TABLET | Freq: Every evening | ORAL | Status: DC | PRN
Start: 2022-05-26 — End: 2022-06-04
  Administered 2022-06-01 – 2022-06-04 (×2): 10 mg via ORAL
  Filled 2022-05-26 (×3): qty 2

## 2022-05-26 NOTE — Progress Notes (Signed)
PROGRESS NOTE   Subjective/Complaints:   Pt reports anxious over therapy- didn't sleep due to anxiety.  Usually takes melatonin at home- family to bring in power w/c today or tomorrow.  LBM last night- Was on torsemide 80 mg in Am and 20 mg at night at home- they stopped it due to renal function issues- but is having more difficulty breathing at night when lays down.    ROS:  Pt denies (+) SOB, abd pain, CP, N/V/C/D, and vision changes   Objective:   No results found. Recent Labs    05/25/22 0401 05/26/22 0654  WBC 13.9* 11.0*  HGB 9.7* 10.2*  HCT 31.2* 32.1*  PLT 415* 407*   Recent Labs    05/25/22 0401 05/26/22 0654  NA 140 139  K 4.5 4.6  CL 105 107  CO2 27 26  GLUCOSE 111* 92  BUN 32* 32*  CREATININE 1.53* 1.51*  CALCIUM 9.0 8.8*    Intake/Output Summary (Last 24 hours) at 05/26/2022 1546 Last data filed at 05/26/2022 1245 Gross per 24 hour  Intake 600 ml  Output --  Net 600 ml        Physical Exam: Vital Signs Blood pressure (!) 169/88, pulse 100, temperature 97.7 F (36.5 C), temperature source Oral, resp. rate 20, height '5\' 2"'$  (1.575 m), weight (!) 157.5 kg, SpO2 96 %.   General: awake, alert, appropriate, sitting EOB eating;  BMI 63.5, NAD HENT: conjugate gaze; oropharynx moist CV: regular rate; no JVD Pulmonary: CTA B/L; no W/R/R- good air movement GI: soft, NT, ND, (+)BS Psychiatric: appropriate but very anxious, almost pressure speech Neurological: Ox3 Musculoskeletal:        General: No tenderness.     Cervical back: No tenderness.     Right lower leg: Edema present.     Comments: Right foot surgical dressing dry and intact; left BKA well healed, shrinker in place L BKA socket appears loose with increased space around residual limb Tone normal No ROM deficits noted Mild swelling R knee, no redness, mild joint line tenderness  Neurological:     General: No focal deficit  present.     Mental Status: She is alert and oriented to person, place, and time.     Sensory: No sensory deficit.     Coordination: Coordination normal.     Comments: Follows commands and answers questions Strength 5/5 in b/l UE Strength 4/5 R hip flexion and knee extension Strength 5/5 L hip flexion and 4+/5 knee extension    Assessment/Plan: 1. Functional deficits which require 3+ hours per day of interdisciplinary therapy in a comprehensive inpatient rehab setting. Physiatrist is providing close team supervision and 24 hour management of active medical problems listed below. Physiatrist and rehab team continue to assess barriers to discharge/monitor patient progress toward functional and medical goals  Care Tool:  Bathing              Bathing assist       Upper Body Dressing/Undressing Upper body dressing        Upper body assist      Lower Body Dressing/Undressing Lower body dressing  Lower body assist Assist for lower body dressing: Moderate Assistance - Patient 50 - 74%     Toileting Toileting    Toileting assist Assist for toileting: Maximal Assistance - Patient 25 - 49%     Transfers Chair/bed transfer  Transfers assist     Chair/bed transfer assist level: Moderate Assistance - Patient 50 - 74%     Locomotion Ambulation   Ambulation assist   Ambulation activity did not occur: Safety/medical concerns          Walk 10 feet activity   Assist  Walk 10 feet activity did not occur: Safety/medical concerns        Walk 50 feet activity   Assist Walk 50 feet with 2 turns activity did not occur: Safety/medical concerns         Walk 150 feet activity   Assist Walk 150 feet activity did not occur: Safety/medical concerns         Walk 10 feet on uneven surface  activity   Assist Walk 10 feet on uneven surfaces activity did not occur: Safety/medical concerns         Wheelchair     Assist Is the  patient using a wheelchair?: Yes Type of Wheelchair: Manual    Wheelchair assist level: Supervision/Verbal cueing Max wheelchair distance: 10'    Wheelchair 50 feet with 2 turns activity    Assist        Assist Level: Maximal Assistance - Patient 25 - 49%   Wheelchair 150 feet activity     Assist      Assist Level: Total Assistance - Patient < 25%   Blood pressure (!) 169/88, pulse 100, temperature 97.7 F (36.5 C), temperature source Oral, resp. rate 20, height '5\' 2"'$  (1.575 m), weight (!) 157.5 kg, SpO2 96 %.  Medical Problem List and Plan: 1. 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  Antithrombotics: -DVT/anticoagulation:  Pharmaceutical: Lovenox 80 mg daily             -antiplatelet therapy: Aspirin 81 mg daily 3. Pain Management: Tylenol as needed --Neurontin 900 mg 3 times daily, Duloxetine '60mg'$  daily             --Oxycodone 7.5/325 every 6 hours as needed  6/21- pain regimen tolerable- con't regimen and titrate as required- asked pt to pre-treat before AM/pm therapies 4. Mood/Sleep: LCSW to evaluate and provide emotional support             -Bipolar disorder: Continue Cymbalta DR 60 mg daily             -antipsychotic agents: n/a 5. Neuropsych/cognition: This patient is capable of making decisions on her own behalf. 6. Skin/Wound Care: Skin care checks             --monitor right foot surgical incision             --continue Augmentin through Friday 6/23 7. Fluids/Electrolytes/Nutrition: Routine I's and O's and follow-up chemistries 8: Gout, acute flare right knee: improving, Continue colchicine 0.3 mg daily 9: Revison of right first  ray amputation 1/16>>NWB             --follow-up after  discharge with podiatry, Dr. Posey Pronto             -Continue Augmentin until 6/23             --dressing changes as per podiatry 10: DM-2: continue sliding scale insulin with meals and at bedtime             --continue meal coverage NovoLog 20 units             --continue Semglee 50 units twice daily             --follow-up endocrinology/Dr. [place]  6/21- CBG's 90s-190- con't regimen and monitor trend 11: Diastolic heart failure: stable, monitor symptoms.  Daily weight.             -She takes home torsemide, it was held due to AKI.  Consider restarted when appropriate   6/21- will restart Torsemide 20 mg in AM and pm and monitor BMP/weight- might need CXR tomorrow 12: Hypertension: continue losartan 50 mg daily 13: COPD: continue Yupelri nebulizer daily, Dulera inhaler twice daily 14: Acute kidney injury/CKD stage III: monitor serum BUN and creatinine. Last Cr 1.53 15: Leukocytosis: continue Augmentin and monitor CBC, incision, fever  6/21- WBC down to 11k- will con't to monitor clinically 16: Anemia: normal red blood cell indices.  Follow-up CBC. Last HGB 9.7 17: Left BKA four years ago; requesting [facility] referral for new shrinker 18: OSA: uses cpap at night with O2 2L, continue 19. Anxiety  6/21- will get Neuropsych involved if need be  20. Insomnia  6/21- will restart home Melatonin 10 mg QHS   I spent a total of 37   minutes on total care today- >50% coordination of care- due to  speaking with pt about all issues as well as d/w PA and nurse.       LOS: 1 days A FACE TO FACE EVALUATION WAS PERFORMED  Arelene Moroni 05/26/2022, 3:46 PM

## 2022-05-26 NOTE — Progress Notes (Signed)
Occupational Therapy Session Note  Patient Details  Name: [name] MRN: [id number] Date of Birth: May 11, 1976  Today's Date: 05/26/2022 OT Individual Time: 1345-1425 OT Individual Time Calculation (min): 40 min    Short Term Goals: Week 1:  OT Short Term Goal 1 (Week 1): STG = LTG due to ELOS  Skilled Therapeutic Interventions/Progress Updates:  Pt resting EOB on arrival. OT intervention with focus on discharge planning and DME requirements and sitting balance. Reviewed WBing precautions. Pt provided bariatric DABSC for use in hospital room. Pt states she has one at home from previous hospital stay. Confirmed that Medicaid will provided SB at discharge. Discussed home setup and reviewed therapy goals. Pt remained seated EOB with all needs within reach.   Therapy Documentation Precautions:  Precautions Precautions: Fall, Other (comment) Precaution Comments: hx of L BKA, prosthesis in room Restrictions Weight Bearing Restrictions: Yes RLE Weight Bearing: Non weight bearing Other Position/Activity Restrictions: LLE prosthetic in room Pain:  Pt denies pain this afternoon  Therapy/Group: Individual Therapy  Leroy Libman 05/26/2022, 3:13 PM

## 2022-05-26 NOTE — Progress Notes (Signed)
Inpatient Rehabilitation  Patient information reviewed and entered into e[facility]hab system by Dewarren Ledbetter M. Jazyah Butsch, M.A., CCC/SLP, PPS Coordinator.  Information including medical coding, functional ability and quality indicators will be reviewed and updated through discharge.    

## 2022-05-26 NOTE — Plan of Care (Signed)
  Problem: RH Grooming Goal: LTG Patient will perform grooming w/assist,cues/equip (OT) Description: LTG: Patient will perform grooming with assist, with/without cues using equipment (OT) Flowsheets (Taken 05/26/2022 1602) LTG: Pt will perform grooming with assistance level of: Independent with assistive device    Problem: RH Bathing Goal: LTG Patient will bathe all body parts with assist levels (OT) Description: LTG: Patient will bathe all body parts with assist levels (OT) Flowsheets (Taken 05/26/2022 1602) LTG: Pt will perform bathing with assistance level/cueing: Minimal Assistance - Patient > 75% LTG: Position pt will perform bathing: Edge of bed   Problem: RH Dressing Goal: LTG Patient will perform upper body dressing (OT) Description: LTG Patient will perform upper body dressing with assist, with/without cues (OT). Flowsheets (Taken 05/26/2022 1602) LTG: Pt will perform upper body dressing with assistance level of: Independent with assistive device Goal: LTG Patient will perform lower body dressing w/assist (OT) Description: LTG: Patient will perform lower body dressing with assist, with/without cues in positioning using equipment (OT) Flowsheets (Taken 05/26/2022 1602) LTG: Pt will perform lower body dressing with assistance level of: Minimal Assistance - Patient > 75%   Problem: RH Toileting Goal: LTG Patient will perform toileting task (3/3 steps) with assistance level (OT) Description: LTG: Patient will perform toileting task (3/3 steps) with assistance level (OT)  Flowsheets (Taken 05/26/2022 1602) LTG: Pt will perform toileting task (3/3 steps) with assistance level: Minimal Assistance - Patient > 75%   Problem: RH Toilet Transfers Goal: LTG Patient will perform toilet transfers w/assist (OT) Description: LTG: Patient will perform toilet transfers with assist, with/without cues using equipment (OT) Flowsheets (Taken 05/26/2022 1602) LTG: Pt will perform toilet transfers with  assistance level of: Minimal Assistance - Patient > 75%   Problem: RH Pre-functional/Other (Specify) Goal: RH LTG OT (Specify) 1 Description: RH LTG OT (Specify) 1 Flowsheets (Taken 05/26/2022 1602) LTG: Other OT (Specify) 1: Patient will demonstrate improved BUE strength and endurance by donning her left LE prothesis at Mod I level.

## 2022-05-26 NOTE — Evaluation (Signed)
Physical Therapy Assessment and Plan  Patient Details  Name: [name] MRN: [id number] Date of Birth: [birth year]  PT Diagnosis: Abnormal posture, Difficulty walking, Impaired sensation, Muscle weakness, and Pain in RLE at surgical site Rehab Potential: Good ELOS: 7-10 days   Today's Date: 05/26/2022 PT Individual Time: 1045-1130 PT Individual Time Calculation (min): 45 min   PT Amount of Missed Time (min): 30 Minutes PT Missed Treatment Reason: Patient fatigue  Hospital Problem: Principal Problem:   Status post transmetatarsal amputation of right foot ([place])   Past Medical History:  Past Medical History:  Diagnosis Date   Abscess of groin, left    Acute on chronic diastolic (congestive) heart failure ([place]) 02/05/2022   Acute on chronic respiratory failure with hypoxia ([place]) 04/11/2022   Acute osteomyelitis of ankle or foot, left ([place]) 01/31/2018   Acute respiratory failure with hypoxia and hypercarbia ([place]) 06/15/2020   Alveolar hypoventilation    Amputation stump infection ([place]) 04/09/2018   Anemia    not on iron pill   Asthma    Bipolar 2 disorder ([name])    Candidiasis of vagina 05/15/2022   Carpal tunnel syndrome on right    recurrent   Cellulitis 08/2010-08/2011   Chest pain with low risk for cardiac etiology 05/11/2021   Chronic pain    COPD (chronic obstructive pulmonary disease) ([name])    Symbicort daily and Proventil as needed   Costochondritis    De Quervain's tenosynovitis, bilateral 11/01/2015   Depression    Diabetes mellitus type II, uncontrolled 2000   Type 2, Uncontrolled.Takes Lantus daily.Fasting blood sugar runs 150   Diabetic ulcer of right foot ([place]) 12/19/2021   Drug-seeking behavior    Elevated troponin    Exposure to mold 04/21/2020   GERD (gastroesophageal reflux disease)    takes Pantoprazole and Zantac daily   HLD (hyperlipidemia)    takes Atorvastatin daily   Hypertension    takes Lisinopril and Coreg daily   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[place]) 04/11/2019    Left shoulder pain 06/23/2019   Morbid obesity ([place])    Morbid obesity with BMI of 60.0-69.9, adult ([place]) 04/15/2021   Nocturia    Nonobstructive atherosclerosis of coronary artery 11/26/2020   11/26/20 Coronary angiogram: 1.  Mild diffuse proximal LAD stenosis with no evidence of obstructive disease (20 to 30% diffuse stenosis);  2.  Left dominant circumflex with widely patent obtuse marginal branches and left PDA branch, no significant stenoses present  3.  Nondominant RCA with mild diffuse nonobstructive stenosis    OSA on CPAP    Peripheral neuropathy    takes Gabapentin daily   Pneumonia    "walking" several yrs ago and as a baby (12/05/2018)   Pneumonia due to COVID-19 virus 04/14/2021   Primary osteoarthritis of first carpometacarpal joint of left hand 07/30/2016   Rectal fissure    Restless leg    Right carpal tunnel syndrome 09/01/2011   SVT (supraventricular tachycardia) ([place])    Syncope 02/25/2016   Thrombocytosis 04/11/2022   Urinary frequency    Urinary incontinence 10/23/2020   UTI (urinary tract infection) 04/15/2021   Varicose veins    Right medial thigh and Left leg    Past Surgical History:  Past Surgical History:  Procedure Laterality Date   AMPUTATION Left 02/01/2018   Procedure: LEFT FOURTH AND 5TH TOE RAY AMPUTATION;  Surgeon: [name], MD;  Location: [facility];  Service: Orthopedics;  Laterality: Left;   AMPUTATION Left 03/03/2018   Procedure: LEFT BELOW KNEE AMPUTATION;  Surgeon: [name], MD;  Location: [facility];  Service: Orthopedics;  Laterality: Left;   AMPUTATION Right 05/21/2022   Procedure: REVISION AMPUTATION RAY 1ST;  Surgeon: [name], DPM;  Location: [facility];  Service: Podiatry;  Laterality: Right;   CARPAL TUNNEL RELEASE Bilateral    CESAREAN SECTION  2007   CORONARY ANGIOGRAPHY N/A 11/26/2020   Procedure: CORONARY ANGIOGRAPHY;  Surgeon: Sherren Mocha, MD;  Location: [facility];  Service: Cardiovascular;  Laterality: N/A;   FOOT  AMPUTATION Right    INCISION AND DRAINAGE ABSCESS Left 10/16/2021   Procedure: INCISION AND DRAINAGE ABSCESS;  Surgeon: Dwan Bolt, MD;  Location: [facility];  Service: General;  Laterality: Left;   INCISION AND DRAINAGE PERIRECTAL ABSCESS Left 05/18/2019   Procedure: IRRIGATION AND DEBRIDEMENT OF PANNIS ABSCESS, POSSIBLE DEBRIDEMENT OF BUTTOCK WOUND;  Surgeon: Donnie Mesa, MD;  Location: [facility];  Service: General;  Laterality: Left;   IRRIGATION AND DEBRIDEMENT BUTTOCKS Left 05/17/2019   Procedure: IRRIGATION AND DEBRIDEMENT BUTTOCKS;  Surgeon: Donnie Mesa, MD;  Location: [facility];  Service: General;  Laterality: Left;   KNEE ARTHROSCOPY Right 07/17/2010   LEFT HEART CATHETERIZATION WITH CORONARY ANGIOGRAM N/A 07/27/2012   Procedure: LEFT HEART CATHETERIZATION WITH CORONARY ANGIOGRAM;  Surgeon: Sherren Mocha, MD;  Location: [facility];  Service: Cardiovascular;  Laterality: N/A;   MASS EXCISION N/A 06/29/2013   Procedure:  WIDE LOCAL EXCISION OF POSTERIOR NECK ABSCESS;  Surgeon: Ralene Ok, MD;  Location: [facility];  Service: General;  Laterality: N/A;   REPAIR KNEE LIGAMENT Left    "fixed ligaments and chipped patella"   right transmetatarsal amputation       Assessment & Plan Clinical Impression:  [name] is a 46 year old female who presented to the emergency department on the morning of 05/14/2022 complaining of fatigue and polyuria.  In addition, she noted worsening pain to her chronic right foot ulcer.  She reported she has had elevated blood sugars over the past several days and her endocrinologist encouraged her to go to the emergency department for further management.  Her serum glucose was 793.  She was started on insulin infusion, IVFs, dextrose. MRI obtained of right foot.  Podiatry, infectious disease and orthopedic surgery consultations obtained.  The patient was taken to the OR on 6/16 and underwent transmetatarsal amputation revision by Dr. Boneta Lucks. Incision closed with  staples. Tolerated well.  Dr., Recommended a 7-day course of Augmentin postoperatively for the possibility remaining soft tissue infection.  He anion gap Improved, transitioned to subcutaneous insulin, she remained afebrile with tolerating carb modified diet.  PT and OT evaluations carried out. She reports her pain is under good control. She had OA/gout pain in her R knee, but this is improving. She The patient requires inpatient medicine and rehabilitation evaluations and services for ongoing dysfunction secondary to revision right transmetatarsal amputation. Patient transferred to CIR on 05/25/2022 .   Patient currently requires min with mobility secondary to muscle weakness, decreased cardiorespiratoy endurance, and decreased sitting balance, decreased balance strategies, and difficulty maintaining precautions.  Prior to hospitalization, patient was min with mobility and lived with Family, Friend(s) in a [place] home.  Home access is  Level entry.  Patient will benefit from skilled PT intervention to maximize safe functional mobility, minimize fall risk, and decrease caregiver burden for planned discharge home with 24 hour assist.  Anticipate patient will benefit from follow up [facility] at discharge.  PT - End of Session Activity Tolerance: Tolerates 30+ min  activity with multiple rests Endurance Deficit: Yes Endurance Deficit Description: frequent rest breaks during functional activity PT Assessment Rehab Potential (ACUTE/IP ONLY): Good PT Barriers to Discharge: Weight;Weight bearing restrictions PT Patient demonstrates impairments in the following area(s): Balance;Endurance;Pain;Safety;Sensory PT Transfers Functional Problem(s): Bed Mobility;Bed to Chair;Car;Furniture;Floor PT Locomotion Functional Problem(s): Ambulation;Wheelchair Mobility;Stairs PT Plan PT Intensity: Minimum of 1-2 x/day ,45 to 90 minutes PT Frequency: 5 out of 7 days PT Duration Estimated Length of Stay: 7-10 days PT  Treatment/Interventions: Balance/vestibular training;Community reintegration;Discharge planning;Disease management/prevention;DME/adaptive equipment instruction;Functional mobility training;Neuromuscular re-education;Pain management;Patient/family education;Splinting/orthotics;Therapeutic Activities;Therapeutic Exercise;UE/LE Strength taining/ROM;UE/LE Coordination activities;Wheelchair propulsion/positioning PT Transfers Anticipated Outcome(s): min A transfers PT Locomotion Anticipated Outcome(s): mod I w/c mobility PT Recommendation Follow Up Recommendations: Home health PT Patient destination: Home Equipment Recommended: Sliding board Equipment Details: pt already owns manual and PWC   PT Evaluation Precautions/Restrictions Precautions Precautions: Fall;Other (comment) Precaution Comments: hx of L BKA, prosthesis in room Restrictions Weight Bearing Restrictions: Yes RLE Weight Bearing: Non weight bearing Pain Pain Assessment Pain Score: 7  Pain Interference Pain Interference Pain Effect on Sleep: 1. Rarely or not at all Pain Interference with Therapy Activities: 1. Rarely or not at all Pain Interference with Day-to-Day Activities: 1. Rarely or not at all Home Living/Prior [place] Available Help at Discharge: Family;Personal care attendant;Available 24 hours/day;Friend(s) Type of Home: Apartment Home Access: Level entry Home Layout: One level Additional Comments: LLE prosthesis  Lives With: Family;Friend(s) Prior Function Level of Independence: Needs assistance with gait;Needs assistance with tranfers (min A at baseline)  Able to Take Stairs?: No Driving: No Vision/Perception  Vision - History Baseline Vision: Other (comment) Visual History: Cataracts;Corrective eye surgery (reports cataract surgery in April) Patient Visual Report: No change from baseline Perception Perception: Within Functional Limits Praxis Praxis: Intact  Cognition Overall  Cognitive Status: Within Functional Limits for tasks assessed Arousal/Alertness: Awake/alert Orientation Level: Oriented X4 Year: 2023 Attention: Focused;Sustained Focused Attention: Appears intact Sustained Attention: Appears intact Memory: Appears intact Awareness: Appears intact Problem Solving: Appears intact Safety/Judgment: Appears intact Sensation Sensation Light Touch: Impaired Detail Light Touch Impaired Details: Impaired RLE Proprioception: Impaired Detail Proprioception Impaired Details: Impaired RLE Coordination Gross Motor Movements are Fluid and Coordinated: No Fine Motor Movements are Fluid and Coordinated: Yes Coordination and Movement Description: 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, R transmet amp, and body habitus Heel Shin Test: not assessed 2/2 body habitus Motor  Motor Motor: Abnormal postural alignment and control Motor - Skilled Clinical Observations: 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, R transmet amputation, body habitus   Trunk/Postural Assessment  Cervical Assessment Cervical Assessment: Within Functional Limits Thoracic Assessment Thoracic Assessment: Exceptions to [facility] (rounded shoulders) Lumbar Assessment Lumbar Assessment: Exceptions to [facility] (posterior pelvic tilt) Postural Control Postural Control: Deficits on evaluation Trunk Control: impaired with dynamic sitting balance, posterior LOB Righting Reactions: delayed/insufficient  Balance Balance Balance Assessed: Yes Static Sitting Balance Static Sitting - Balance Support: Bilateral upper extremity supported;Feet unsupported Static Sitting - Level of Assistance: 5: Stand by assistance Dynamic Sitting Balance Dynamic Sitting - Balance Support: No upper extremity supported;Feet unsupported;During functional activity Dynamic Sitting - Level of Assistance: 4: Min assist Extremity Assessment      RLE Assessment RLE Assessment: Exceptions to [facility] General Strength Comments: impaired, see below; s/p R transmet amputation RLE  Strength Right Hip Flexion: 3/5 Right Knee Flexion: 4/5 Right Knee Extension: 4/5 LLE Assessment LLE Assessment: Exceptions to [facility] General Strength Comments: L BKA  Care Tool Care Tool Bed Mobility Roll left and right activity   Roll left and  right assist level: Supervision/Verbal cueing    Sit to lying activity   Sit to lying assist level: Minimal Assistance - Patient > 75%    Lying to sitting on side of bed activity   Lying to sitting on side of bed assist level: the ability to move from lying on the back to sitting on the side of the bed with no back support.: Supervision/Verbal cueing     Care Tool Transfers Sit to stand transfer Sit to stand activity did not occur: Safety/medical concerns      Chair/bed transfer   Chair/bed transfer assist level: Moderate Assistance - Patient 50 - 74%     Psychologist, counselling transfer activity did not occur: Safety/medical concerns        Care Tool Locomotion Ambulation Ambulation activity did not occur: Safety/medical concerns        Walk 10 feet activity Walk 10 feet activity did not occur: Safety/medical concerns       Walk 50 feet with 2 turns activity Walk 50 feet with 2 turns activity did not occur: Safety/medical concerns      Walk 150 feet activity Walk 150 feet activity did not occur: Safety/medical concerns      Walk 10 feet on uneven surfaces activity Walk 10 feet on uneven surfaces activity did not occur: Safety/medical concerns      Stairs Stair activity did not occur: N/A        Walk up/down 1 step activity Walk up/down 1 step or curb (drop down) activity did not occur: N/A      Walk up/down 4 steps activity Walk up/down 4 steps activity did not occur: N/A      Walk up/down 12 steps activity Walk up/down 12 steps activity did not occur: N/A      Pick up small objects from floor Pick up small object from the floor (from standing position) activity did not occur: Safety/medical  concerns      Wheelchair Is the patient using a wheelchair?: Yes Type of Wheelchair: Manual   Wheelchair assist level: Supervision/Verbal cueing Max wheelchair distance: 10'  Wheel 50 feet with 2 turns activity   Assist Level: Maximal Assistance - Patient 25 - 49%  Wheel 150 feet activity   Assist Level: Total Assistance - Patient < 25%    Refer to Care Plan for Long Term Goals  SHORT TERM GOAL WEEK 1 PT Short Term Goal 1 (Week 1): =LTG due to ELOS  Recommendations for other services: None   Skilled Therapeutic Intervention Evaluation completed (see details above and below) with education on PT POC and goals and individual treatment initiated with focus on functional transfer and w/c mobility assessment. Pt received seated in w/c in room falling asleep, reports she did not sleep well last night and requesting to transfer back to bed. Pt agreeable to participate in PT eval as able before transferring back to bed. Manual w/c propulsion x 10 ft with use of BUE at Supervision level before onset of fatigue. Due to body habitus and w/c wheel alignment pt with difficulty with propulsion. Pt's family to bring in her chair that she uses at home for further assessment. Slide board transfer w/c to bed with mod A with increased time needed to complete transfer, max cueing for head/hips relationship during transfer and anterior weight shift. Pt able to perform oral hygiene while seated EOB with setup A. Sit to supine with Supervision, supine to sit with  min A for trunk elevation. Assisted pt with doffing LLE prosthetic at bed level. Pt left in sidelying in bed with needs in reach, bed alarm in place. Pt missed 30 min of therapy evaluation due to fatigue.  Mobility Bed Mobility Bed Mobility: Rolling Right;Rolling Left;Supine to Sit;Sit to Supine Rolling Right: Supervision/verbal cueing Rolling Left: Supervision/Verbal cueing Supine to Sit: Supervision/Verbal cueing Sit to Supine: Minimal Assistance -  Patient > 75% Transfers Transfers: Lateral/Scoot Transfers Lateral/Scoot Transfers: Moderate Assistance - Patient 50-74% Transfer (Assistive device): Other (Comment) (slide board) Artist / Additional Locomotion Stairs: No Architect: Yes Wheelchair Assistance: Chartered loss adjuster: Both upper extremities Wheelchair Parts Management: Needs assistance Distance: 10   Discharge Criteria: Patient will be discharged from PT if patient refuses treatment 3 consecutive times without medical reason, if treatment goals not met, if there is a change in medical status, if patient makes no progress towards goals or if patient is discharged from hospital.  The above assessment, treatment plan, treatment alternatives and goals were discussed and mutually agreed upon: by patient   Excell Seltzer, PT, DPT, CSRS 05/26/2022, 11:47 AM

## 2022-05-26 NOTE — Evaluation (Signed)
Occupational Therapy Assessment and Plan  Patient Details  Name: [name] MRN: [id number] Date of Birth: 1976-04-17  OT Diagnosis: acute pain and muscle weakness (generalized) Rehab Potential: Rehab Potential (ACUTE ONLY): Excellent ELOS: 7-10 days   Today's Date: 05/26/2022 OT Individual Time: 800-925 OT Individual Time Calculation (min): 85 min     Hospital Problem: Principal Problem:   Status post transmetatarsal amputation of right foot ([place])   Past Medical History:  Past Medical History:  Diagnosis Date   Abscess of groin, left    Acute on chronic diastolic (congestive) heart failure ([place]) 02/05/2022   Acute on chronic respiratory failure with hypoxia ([place]) 04/11/2022   Acute osteomyelitis of ankle or foot, left ([place]) 01/31/2018   Acute respiratory failure with hypoxia and hypercarbia ([place]) 06/15/2020   Alveolar hypoventilation    Amputation stump infection ([place]) 04/09/2018   Anemia    not on iron pill   Asthma    Bipolar 2 disorder ([name])    Candidiasis of vagina 05/15/2022   Carpal tunnel syndrome on right    recurrent   Cellulitis 08/2010-08/2011   Chest pain with low risk for cardiac etiology 05/11/2021   Chronic pain    COPD (chronic obstructive pulmonary disease) ([name])    Symbicort daily and Proventil as needed   Costochondritis    De Quervain's tenosynovitis, bilateral 11/01/2015   Depression    Diabetes mellitus type II, uncontrolled 2000   Type 2, Uncontrolled.Takes Lantus daily.Fasting blood sugar runs 150   Diabetic ulcer of right foot ([place]) 12/19/2021   Drug-seeking behavior    Elevated troponin    Exposure to mold 04/21/2020   GERD (gastroesophageal reflux disease)    takes Pantoprazole and Zantac daily   HLD (hyperlipidemia)    takes Atorvastatin daily   Hypertension    takes Lisinopril and Coreg daily   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[place]) 04/11/2019   Left shoulder pain 06/23/2019   Morbid obesity ([place])    Morbid obesity with BMI of 60.0-69.9, adult ([place])  04/15/2021   Nocturia    Nonobstructive atherosclerosis of coronary artery 11/26/2020   11/26/20 Coronary angiogram: 1.  Mild diffuse proximal LAD stenosis with no evidence of obstructive disease (20 to 30% diffuse stenosis);  2.  Left dominant circumflex with widely patent obtuse marginal branches and left PDA branch, no significant stenoses present  3.  Nondominant RCA with mild diffuse nonobstructive stenosis    OSA on CPAP    Peripheral neuropathy    takes Gabapentin daily   Pneumonia    "walking" several yrs ago and as a baby (12/05/2018)   Pneumonia due to COVID-19 virus 04/14/2021   Primary osteoarthritis of first carpometacarpal joint of left hand 07/30/2016   Rectal fissure    Restless leg    Right carpal tunnel syndrome 09/01/2011   SVT (supraventricular tachycardia) ([place])    Syncope 02/25/2016   Thrombocytosis 04/11/2022   Urinary frequency    Urinary incontinence 10/23/2020   UTI (urinary tract infection) 04/15/2021   Varicose veins    Right medial thigh and Left leg    Past Surgical History:  Past Surgical History:  Procedure Laterality Date   AMPUTATION Left 02/01/2018   Procedure: LEFT FOURTH AND 5TH TOE RAY AMPUTATION;  Surgeon: [name], MD;  Location: [facility];  Service: Orthopedics;  Laterality: Left;   AMPUTATION Left 03/03/2018   Procedure: LEFT BELOW KNEE AMPUTATION;  Surgeon: [name], MD;  Location: [facility];  Service: Orthopedics;  Laterality: Left;  AMPUTATION Right 05/21/2022   Procedure: REVISION AMPUTATION RAY 1ST;  Surgeon: [name], DPM;  Location: [facility];  Service: Podiatry;  Laterality: Right;   CARPAL TUNNEL RELEASE Bilateral    CESAREAN SECTION  2007   CORONARY ANGIOGRAPHY N/A 11/26/2020   Procedure: CORONARY ANGIOGRAPHY;  Surgeon: Sherren Mocha, MD;  Location: [facility];  Service: Cardiovascular;  Laterality: N/A;   FOOT AMPUTATION Right    INCISION AND DRAINAGE ABSCESS Left 10/16/2021   Procedure: INCISION AND DRAINAGE ABSCESS;   Surgeon: Dwan Bolt, MD;  Location: [facility];  Service: General;  Laterality: Left;   INCISION AND DRAINAGE PERIRECTAL ABSCESS Left 05/18/2019   Procedure: IRRIGATION AND DEBRIDEMENT OF PANNIS ABSCESS, POSSIBLE DEBRIDEMENT OF BUTTOCK WOUND;  Surgeon: Donnie Mesa, MD;  Location: [facility];  Service: General;  Laterality: Left;   IRRIGATION AND DEBRIDEMENT BUTTOCKS Left 05/17/2019   Procedure: IRRIGATION AND DEBRIDEMENT BUTTOCKS;  Surgeon: Donnie Mesa, MD;  Location: [facility];  Service: General;  Laterality: Left;   KNEE ARTHROSCOPY Right 07/17/2010   LEFT HEART CATHETERIZATION WITH CORONARY ANGIOGRAM N/A 07/27/2012   Procedure: LEFT HEART CATHETERIZATION WITH CORONARY ANGIOGRAM;  Surgeon: Sherren Mocha, MD;  [facility];  Service: Cardiovascular;  Laterality: N/A;   MASS EXCISION N/A 06/29/2013   Procedure:  WIDE LOCAL EXCISION OF POSTERIOR NECK ABSCESS;  Surgeon: Ralene Ok, MD;  Location: [facility];  Service: General;  Laterality: N/A;   REPAIR KNEE LIGAMENT Left    "fixed ligaments and chipped patella"   right transmetatarsal amputation       Assessment & Plan Clinical Impression:[name] is a 46 year old female who presented to the emergency department on the morning of 05/14/2022 complaining of fatigue and polyuria.  In addition, she noted worsening pain to her chronic right foot ulcer.  She reported she has had elevated blood sugars over the past several days and her endocrinologist encouraged her to go to the emergency department for further management.  Her serum glucose was 793.  She was started on insulin infusion, IVFs, dextrose. MRI obtained of right foot.  Podiatry, infectious disease and orthopedic surgery consultations obtained.  The patient was taken to the OR on 6/16 and underwent transmetatarsal amputation revision by Dr. Boneta Lucks. Incision closed with staples. Tolerated well.  Dr., Recommended a 7-day course of Augmentin postoperatively for the possibility  remaining soft tissue infection.  He anion gap Improved, transitioned to subcutaneous insulin, she remained afebrile with tolerating carb modified diet.  PT and OT evaluations carried out. She reports her pain is under good control. She had OA/gout pain in her R knee, but this is improving. She The patient requires inpatient medicine and rehabilitation evaluations and services for ongoing dysfunction secondary to revision right transmetatarsal amputation. Patient transferred to CIR on 05/25/2022 .   Patient currently requires  SBA - total Assist  with basic self-care skills secondary to muscle weakness and decreased balance strategies, difficulty maintaining precautions, and decreased endurance and activity tolerance .  Prior to hospitalization, patient could complete BADL tasks with min.  Patient will benefit from skilled intervention to decrease level of assist with basic self-care skills and increase independence with basic self-care skills prior to discharge home with care partner.  Anticipate patient will require minimal physical assistance. Follow up therapy services after discharge: TBD.  OT - End of Session Activity Tolerance: Decreased this session Endurance Deficit: Yes Endurance Deficit Description: frequent rest breaks during functional activity OT Assessment Rehab Potential (ACUTE ONLY): Excellent OT  Barriers to Discharge: Weight;Weight bearing restrictions;Wound Care OT Barriers to Discharge Comments: decreased awareness and knowledge of importance with skin monitoring OT Patient demonstrates impairments in the following area(s): Balance;Skin Integrity;Endurance;Pain;Safety OT Basic ADL's Functional Problem(s): Grooming;Bathing;Dressing;Toileting OT Transfers Functional Problem(s): Toilet OT Plan OT Intensity: Minimum of 1-2 x/day, 45 to 90 minutes OT Frequency: 5 out of 7 days OT Duration/Estimated Length of Stay: 7-10 days OT Treatment/Interventions: Balance/vestibular  training;Discharge planning;Functional mobility training;Psychosocial support;Therapeutic Activities;UE/LE Coordination activities;Patient/family education;Community reintegration;DME/adaptive equipment instruction;Pain management;Skin care/wound managment;UE/LE Strength taining/ROM;Wheelchair propulsion/positioning;Therapeutic Exercise;Self Care/advanced ADL retraining;Neuromuscular re-education OT Basic Self-Care Anticipated Outcome(s): SBA- Min A OT Toileting Anticipated Outcome(s): Min A OT Bathroom Transfers Anticipated Outcome(s): Min A OT Recommendation Recommendations for Other Services: Therapeutic Recreation consult Therapeutic Recreation Interventions: Stress management Patient destination: Home Follow Up Recommendations: Other (comment) (TBD) Equipment Recommended: To be determined Equipment Details: Pt owns: two bedside commodes, tub/bench, manual w/c, pwr w/c, RW, home oxygen PRN night use   OT Evaluation Precautions/Restrictions  Precautions Precautions: Fall;Other (comment) Precaution Comments: hx of L BKA, prosthesis in room Restrictions Weight Bearing Restrictions: Yes RLE Weight Bearing: Non weight bearing Other Position/Activity Restrictions: LLE prosthetic in room  Home Living/Prior [place] expects to be discharged to:: Private residence Living Arrangements: Children, Non-relatives/Friends Available Help at Discharge: Family, Personal care attendant, Available 24 hours/day, Friend(s) (CAP Aide M-F for 8 hour shifts) Type of Home: Apartment Home Access: Level entry Home Layout: One level Bathroom Shower/Tub: Tub/shower unit, Sponge bathes at baseline (Sponge bathes since recent RLE surgery. Would like to return to showers.) Bathroom Toilet: Standard Bathroom Accessibility: Yes Additional Comments: LLE prosthesis  Lives With: Family, Friend(s) (16 y/o Daughter, Friend and her 63 y/o Son.) Prior Function Level of Independence:  Needs assistance with gait, Needs assistance with tranfers (Min A at baseline)  Able to Take Stairs?: No Driving: No Vision Baseline Vision/History: 1 Wears glasses Ability to See in Adequate Light: 0 Adequate Patient Visual Report: No change from baseline Vision Assessment?: No apparent visual deficits Perception  Perception: Within Functional Limits Praxis Praxis: Intact Cognition Cognition Overall Cognitive Status: Within Functional Limits for tasks assessed Arousal/Alertness: Awake/alert Memory: Appears intact Attention: Focused;Sustained Focused Attention: Appears intact Sustained Attention: Appears intact Awareness: Appears intact Problem Solving: Appears intact Safety/Judgment: Appears intact Brief Interview for Mental Status (BIMS) Repetition of Three Words (First Attempt): 3 Temporal Orientation: Year: Correct Temporal Orientation: Month: Accurate within 5 days Temporal Orientation: Day: Correct Recall: "Sock": Yes, no cue required Recall: "Blue": Yes, no cue required Recall: "Bed": Yes, no cue required BIMS Summary Score: 15 Sensation Sensation Light Touch: Impaired Detail Light Touch Impaired Details: Impaired RLE Proprioception: Impaired Detail Proprioception Impaired Details: Impaired RLE Coordination Gross Motor Movements are Fluid and Coordinated: No Fine Motor Movements are Fluid and Coordinated: Yes Coordination and Movement Description: 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, R transmet amp, and body habitus Heel Shin Test: not assessed 2/2 body habitus Motor  Motor Motor: Abnormal postural alignment and control Motor - Skilled Clinical Observations: 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, R transmet amputation, body habitus  Trunk/Postural Assessment  Cervical Assessment Cervical Assessment: Within Functional Limits Thoracic Assessment Thoracic Assessment: Exceptions to [facility] (rounded shoulders) Lumbar Assessment Lumbar Assessment: Exceptions to [facility] (posterior pelvic tilt) Postural  Control Postural Control: Deficits on evaluation Trunk Control: impaired with dynamic sitting balance, posterior LOB Righting Reactions: delayed/insufficient  Balance Balance Balance Assessed: Yes Static Sitting Balance Static Sitting - Balance Support: Bilateral upper extremity supported;Feet unsupported Static Sitting - Level of Assistance: 5: Stand by  assistance Dynamic Sitting Balance Dynamic Sitting - Balance Support: No upper extremity supported;Feet unsupported;During functional activity Dynamic Sitting - Level of Assistance: 4: Min assist Extremity/Trunk Assessment RUE Assessment RUE Assessment: Within Functional Limits LUE Assessment Active Range of Motion (AROM) Comments: shoulder flexion 75% range, abduction 75%, functional IR/er General Strength Comments: Overall shoulder strength: 4+/5 all ranges.  Care Tool   05/26/22 0800  CareTool - Eating  Eating Assist Level Independent  CareTool - Oral Care  Oral Care Assist Level Set up assist  CareTool - Bathing  Body parts bathed by patient Right arm;Left arm;Chest;Abdomen;Right upper leg;Left upper leg;Face  Body parts bathed by helper Front perineal area;Buttocks;Right lower leg  Body parts n/a Left lower leg  Assist Level Moderate Assistance - Patient 50 - 74%  CareTool- Upper Body Dressing (including orthotics  What is the patient wearing? Hospital gown only  Assist Level Minimal Assistance - Patient > 75%  CareTool - Lower Body Dressing (excluding footwear)  Lower body dressing activity did not occur Safety/medical concerns (Left prosthesis and RLE NWB from recent surgery (Right foot bandaged))  CareTool - Putting on/Taking off footwear  Putting on/taking off footwear activity did not occur Safety/medical concerns (same as above)  CareTool - Bed Mobility  Roll left and right assist level Supervision/Verbal cueing  Sit to lying assist level Minimal Assistance - Patient > 75%  Lying to sitting on side of bed assist  level: the ability to move from lying on the back to sitting on the side of the bed with no back support. Minimal Assistance - Patient > 75%  CareTool - Chair/bed transfer  Chair/bed transfer assist level Maximal Assistance - Patient 25 - 49%  CareTool - Toilet Transfers  Assist Level Maximal Assistance - Patient 24 - 49%  CareTool - Hearing  Ability to hear (with hearing aid or hearing appliances if normally used) 0. Adequate - no difficulty in normal conservation, social interaction, listening to TV  CareTool - Expression  Expression of Ideas and Wants 4. Without difficulty (complex and basic) - expresses complex messages without difficulty and with speech that is clear and easy to understand  CareTool - Comprehension  Understanding Verbal and Non-Verbal Content 4. Understands (complex and basic) - clear comprehension without cues or repetitions  CareTool - Memory  Memory/Recall Ability  Location of own room;Staff names and faces;That he or she is in a hospital/hospital unit;Current season  CareTool - Signs and Symptoms of Delirium (from CAM)  Is there evidence of an acute change in mental status from the patient's baseline? 0 No  Inattention 0 Behavior not present  Disorganized thinking 0 Behavior not present  Altered level of consciousness  0 Behavior not present  Positive CAM assessment intervention/preventative measures Utilize bed alarms;Universal precautions (preventative) measures initiated    Refer to Care Plan for Long Term Goals  SHORT TERM GOAL WEEK 1 OT Short Term Goal 1 (Week 1): STG = LTG due to ELOS  Recommendations for other services: Neuropsych and Therapeutic Recreation  Stress management   Skilled Therapeutic Intervention  Patient participated in ADL re-training task while seated on EOB. Patient demonstrates decreased activity tolerance and endurance and decreased strength. Due to recent right foot amputation revision with WB precautions, patient is experiencing  increased difficulty with BADL tasks and requires additional physical assistance to complete. Patient reports that she is trying to get her Daughter to bring in clothes and her wheelchair which will help her with her w/c mobility. See below for functional performance during  ADL.     ADL ADL Eating: Independent Where Assessed-Eating: Edge of bed Grooming: Minimal assistance Where Assessed-Grooming: Sitting at sink Upper Body Bathing: Setup Where Assessed-Upper Body Bathing: Edge of bed Lower Body Bathing: Dependent Where Assessed-Lower Body Bathing: Edge of bed;Bed level Upper Body Dressing: Minimal assistance (hospital gown) Where Assessed-Upper Body Dressing: Edge of bed Lower Body Dressing: Maximal assistance (prothesis donned) Where Assessed-Lower Body Dressing: Edge of bed;Bed level Toileting: Dependent Where Assessed-Toileting: Bed level Toilet Transfer: Maximal assistance Toilet Transfer Method: Editor, commissioning Transfer: Not assessed;Unable to assess Tub/Shower Transfer Method: Unable to assess [employer] Transfer: Not assessed;Unable to assess Social research officer, government Method: Unable to assess Mobility  Bed Mobility Bed Mobility: Rolling Right;Rolling Left;Supine to Sit;Sit to Supine Rolling Right: Supervision/verbal cueing Rolling Left: Supervision/Verbal cueing Supine to Sit: Supervision/Verbal cueing Sit to Supine: Minimal Assistance - Patient > 75%   Discharge Criteria: Patient will be discharged from OT if patient refuses treatment 3 consecutive times without medical reason, if treatment goals not met, if there is a change in medical status, if patient makes no progress towards goals or if patient is discharged from hospital.  The above assessment, treatment plan, treatment alternatives and goals were discussed and mutually agreed upon: by patient  Ailene Ravel, OTR/L,CBIS  Supplemental OT - [facility] and [facility]  05/26/2022, 3:51 PM

## 2022-05-26 NOTE — Plan of Care (Signed)
  Problem: RH Balance Goal: LTG Patient will maintain dynamic sitting balance (PT) Description: LTG:  Patient will maintain dynamic sitting balance with assistance during mobility activities (PT) Flowsheets (Taken 05/26/2022 1236) LTG: Pt will maintain dynamic sitting balance during mobility activities with:: Independent   Problem: Sit to Stand Goal: LTG:  Patient will perform sit to stand with assistance level (PT) Description: LTG:  Patient will perform sit to stand with assistance level (PT) Flowsheets (Taken 05/26/2022 1236) LTG: PT will perform sit to stand in preparation for functional mobility with assistance level: Minimal Assistance - Patient > 75%   Problem: RH Bed Mobility Goal: LTG Patient will perform bed mobility with assist (PT) Description: LTG: Patient will perform bed mobility with assistance, with/without cues (PT). Flowsheets (Taken 05/26/2022 1236) LTG: Pt will perform bed mobility with assistance level of: Supervision/Verbal cueing   Problem: RH Bed to Chair Transfers Goal: LTG Patient will perform bed/chair transfers w/assist (PT) Description: LTG: Patient will perform bed to chair transfers with assistance (PT). Flowsheets (Taken 05/26/2022 1236) LTG: Pt will perform Bed to Chair Transfers with assistance level: Minimal Assistance - Patient > 75%   Problem: RH Wheelchair Mobility Goal: LTG Patient will propel w/c in controlled environment (PT) Description: LTG: Patient will propel wheelchair in controlled environment, # of feet with assist (PT) Flowsheets (Taken 05/26/2022 1236) LTG: Pt will propel w/c in controlled environ  assist needed:: Independent with assistive device LTG: Propel w/c distance in controlled environment: 150 ft Goal: LTG Patient will propel w/c in home environment (PT) Description: LTG: Patient will propel wheelchair in home environment, # of feet with assistance (PT). Flowsheets (Taken 05/26/2022 1236) LTG: Pt will propel w/c in home environ   assist needed:: Independent with assistive device LTG: Propel w/c distance in home environment: 75 ft Goal: LTG Patient will propel w/c in community environment (PT) Description: LTG: Patient will propel wheelchair in community environment, # of feet with assist (PT) Flowsheets (Taken 05/26/2022 1236) LTG: Pt will propel w/c in community environ  assist needed:: Independent with assistive device ZDG:[id number] w/c distance in community environment: 300 ft

## 2022-05-27 ENCOUNTER — Inpatient Hospital Stay (HOSPITAL_COMMUNITY): Payer: Medicaid Other

## 2022-05-27 LAB — BASIC METABOLIC PANEL
Anion gap: 10 (ref 5–15)
BUN: 41 mg/dL — ABNORMAL HIGH (ref 6–20)
CO2: 27 mmol/L (ref 22–32)
Calcium: 8.9 mg/dL (ref 8.9–10.3)
Chloride: 105 mmol/L (ref 98–111)
Creatinine, Ser: 1.83 mg/dL — ABNORMAL HIGH (ref 0.44–1.00)
GFR, Estimated: 34 mL/min — ABNORMAL LOW (ref >60)
Glucose, Bld: 89 mg/dL (ref 70–99)
Potassium: 5.7 mmol/L — ABNORMAL HIGH (ref 3.5–5.1)
Sodium: 142 mmol/L (ref 135–145)

## 2022-05-27 LAB — GLUCOSE, CAPILLARY
Glucose-Capillary: 89 mg/dL (ref 70–99)
Glucose-Capillary: 172 mg/dL — ABNORMAL HIGH (ref 70–99)
Glucose-Capillary: 325 mg/dL — ABNORMAL HIGH (ref 70–99)
Glucose-Capillary: 336 mg/dL — ABNORMAL HIGH (ref 70–99)

## 2022-05-27 IMAGING — CT CT HEAD W/O CM
4 series · 16 of 47 positions shown, 18 images · non-contrast
Comparison: Brain MRI 07/13/2014.  CT angiogram head 07/13/2014.

CLINICAL DATA: Provided history: Fall, hit her head against chair.
Accidental fall.

EXAM:
CT HEAD WITHOUT CONTRAST
TECHNIQUE: Contiguous axial images were obtained from the base of the skull
through the vertex without intravenous contrast.

[Series 2: head wo · axial · 0.48mm/px · z∈[-55,+65]mm · 7 of 32 slices shown, 9 images]
[im 4/32  brain]
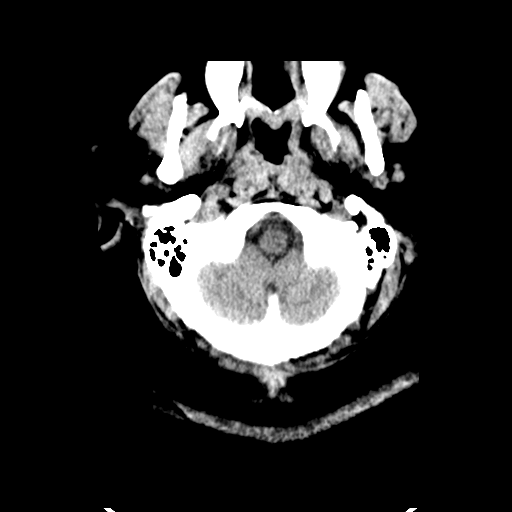
[im 4/32  bone]
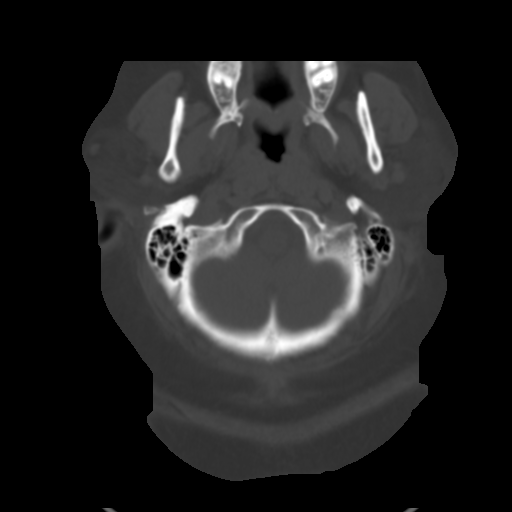
[im 8/32  brain]
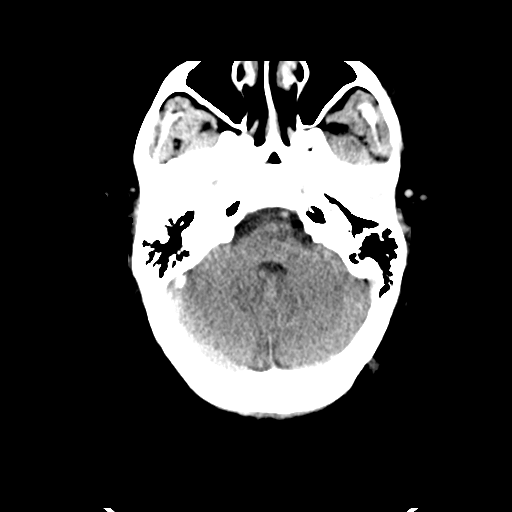
[im 12/32  brain]
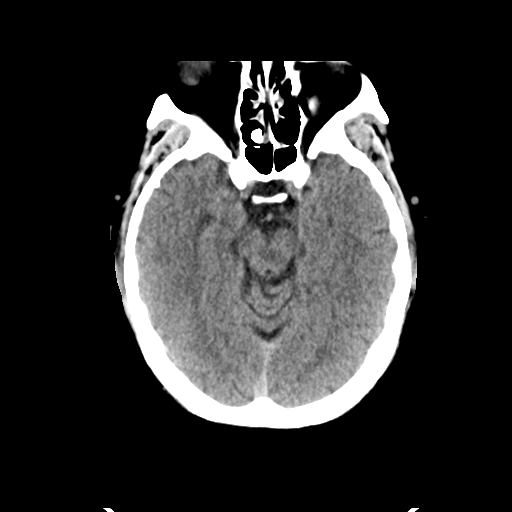
[im 16/32  brain]
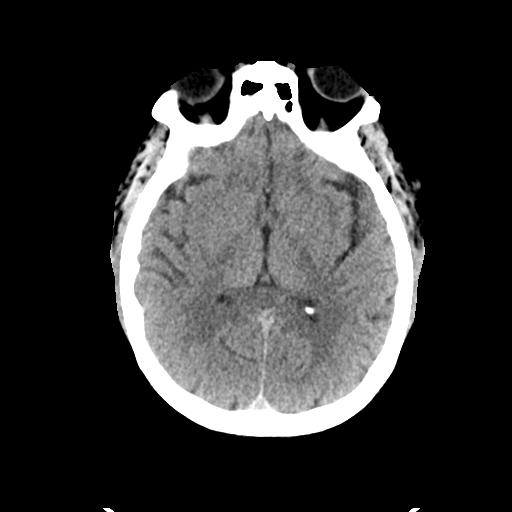
[im 20/32  brain]
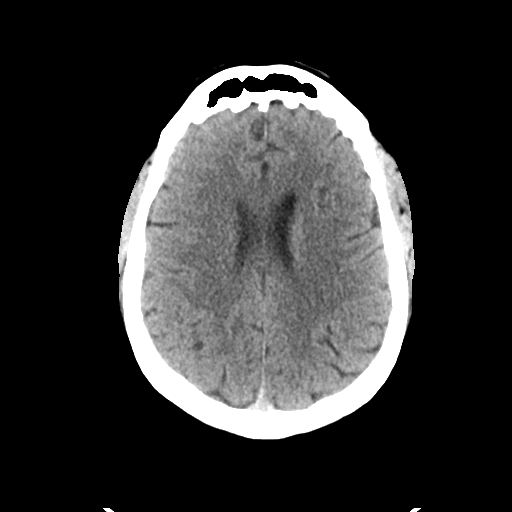
[im 20/32  bone]
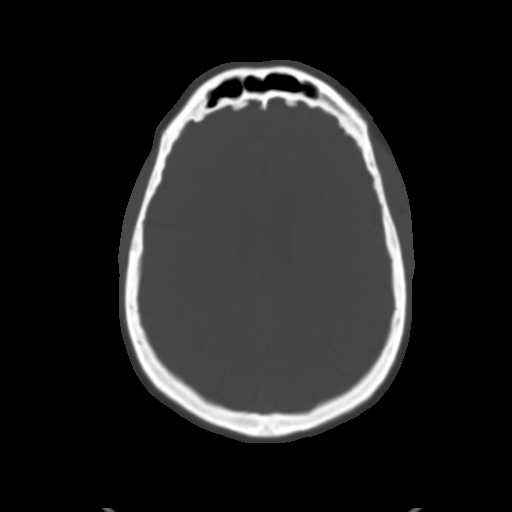
[im 24/32  brain]
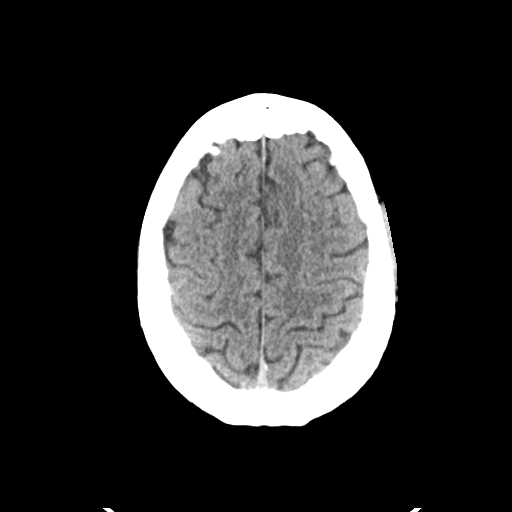
[im 28/32  brain]
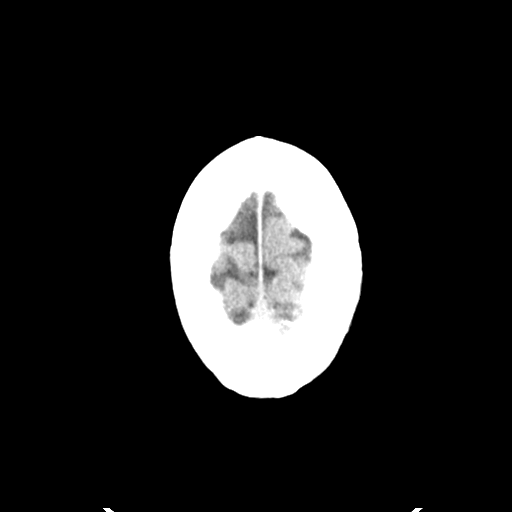

[Series 3: head bone · axial · 0.48mm/px · z∈[-56,-24]mm · 3 of 79 slices shown]
[im 8/79  bone]
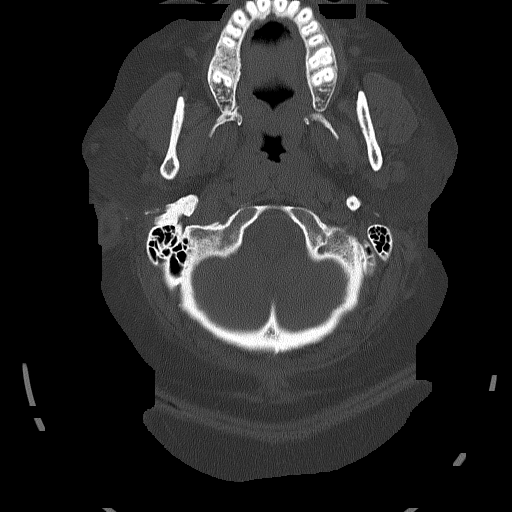
[im 16/79  bone]
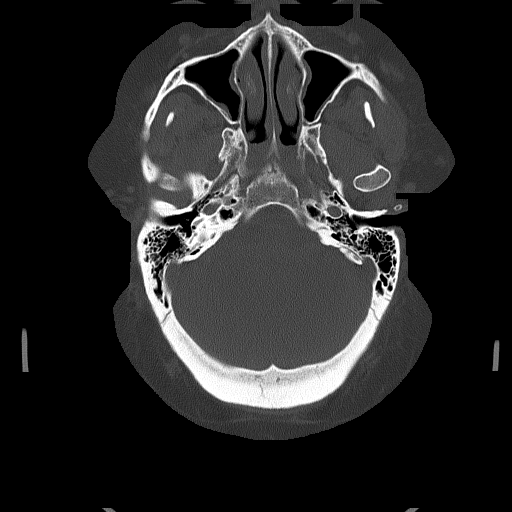
[im 24/79  bone]
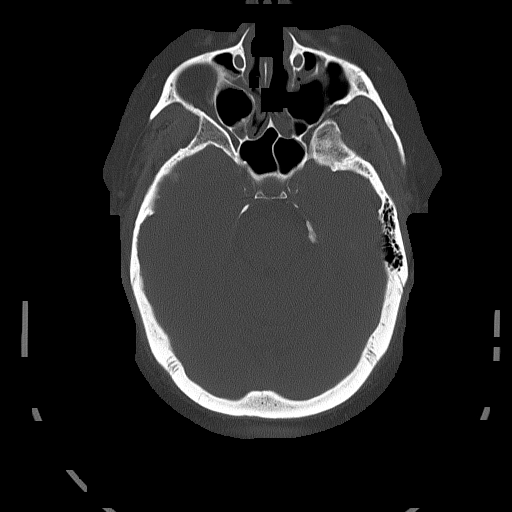

[Series 4: coronal soft tissue · coronal · 0.38mm/px · 3 of 67 slices shown]
[im 23/67  brain]
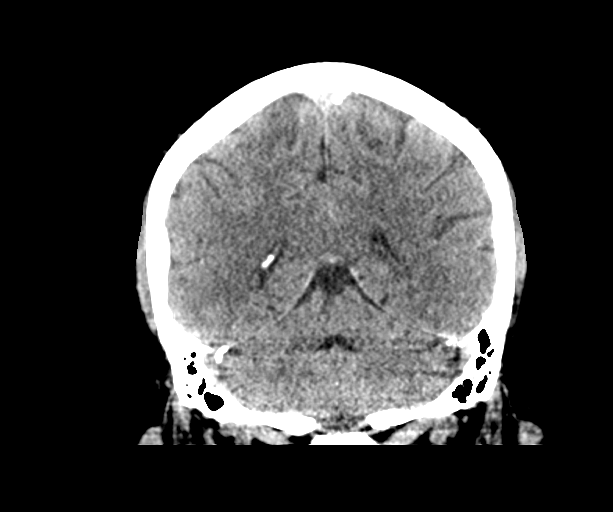
[im 30/67  brain]
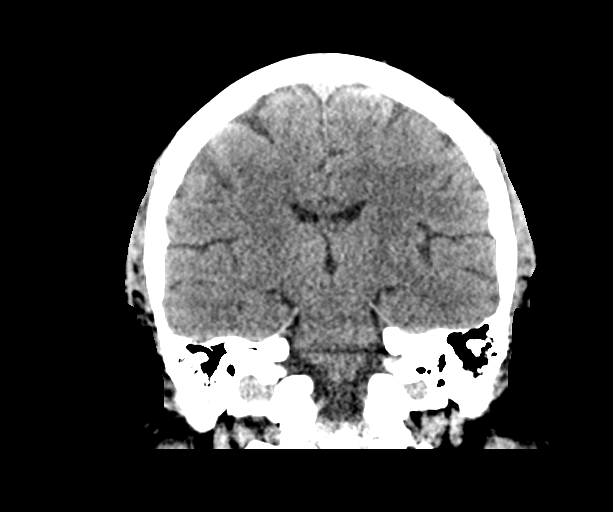
[im 37/67  brain]
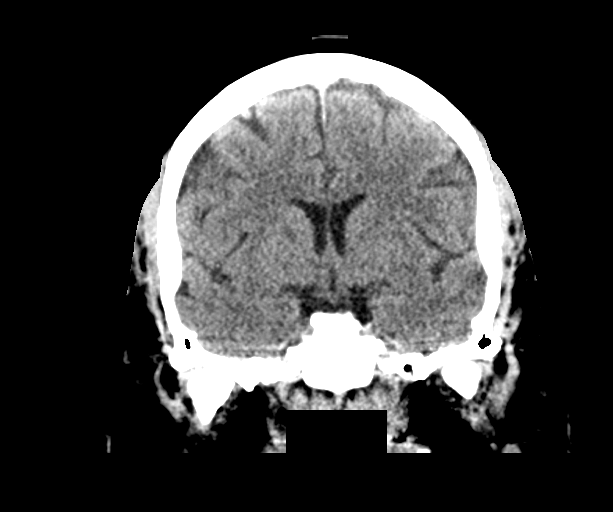

[Series 5: sagittal soft tissue · sagittal · 0.36mm/px · 3 of 54 slices shown]
[im 18/54  brain]
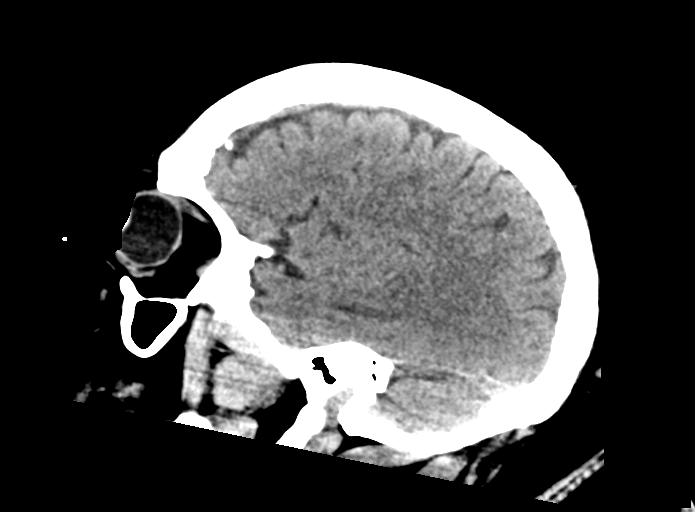
[im 27/54  brain]
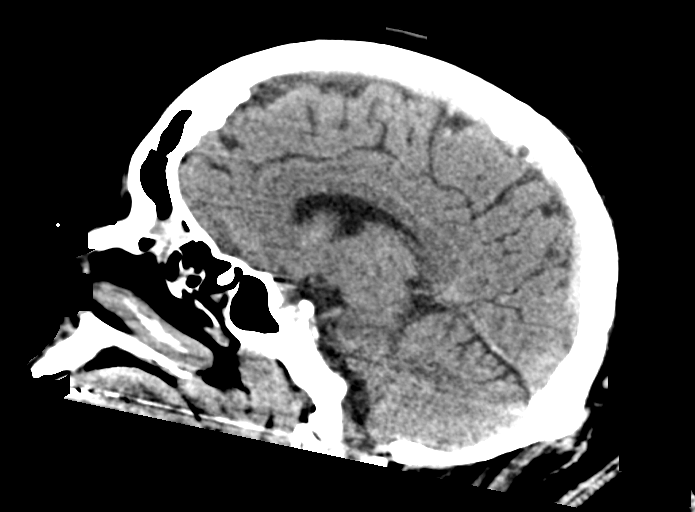
[im 36/54  brain]
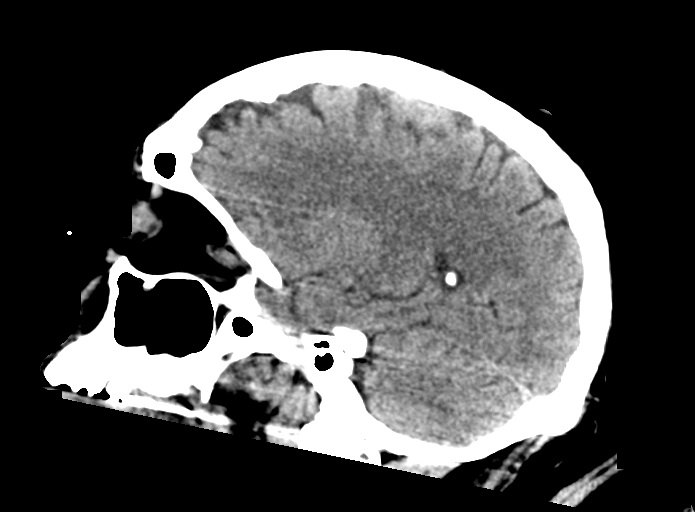

[16 of 47 positions shown; findings below may reference images not displayed]

FINDINGS: Brain:

Cerebral volume is normal.

There is no acute intracranial hemorrhage.

No demarcated cortical infarct.

No extra-axial fluid collection.

No evidence of intracranial mass.

No midline shift.

Vascular: No hyperdense vessel.  Atherosclerotic calcifications

Skull: Normal. Negative for fracture or focal lesion.

Sinuses/Orbits: Visualized orbits show no acute finding.
Subcentimeter osteoma within a posterior right ethmoid air cell.
Moderate bilateral ethmoid sinus mucosal thickening. Mild mucosal
thickening and small volume frothy secretions within the left
sphenoid sinus. Trace right sphenoid sinus mucosal thickening. Mild
bilateral maxillary sinus mucosal thickening.
IMPRESSION: Unremarkable non-contrast CT appearance of the brain. No evidence of
acute intracranial abnormality.

Paranasal sinus disease, as described.

## 2022-05-27 IMAGING — DX DG LUMBAR SPINE 2-3V
3 series · 3 of 3 positions shown · non-contrast
Comparison: Reformats from abdominopelvic CT 04/03/2021

CLINICAL DATA: Fall today with low back pain.

EXAM:
LUMBAR SPINE - 2-3 VIEW

[l-spine ap]
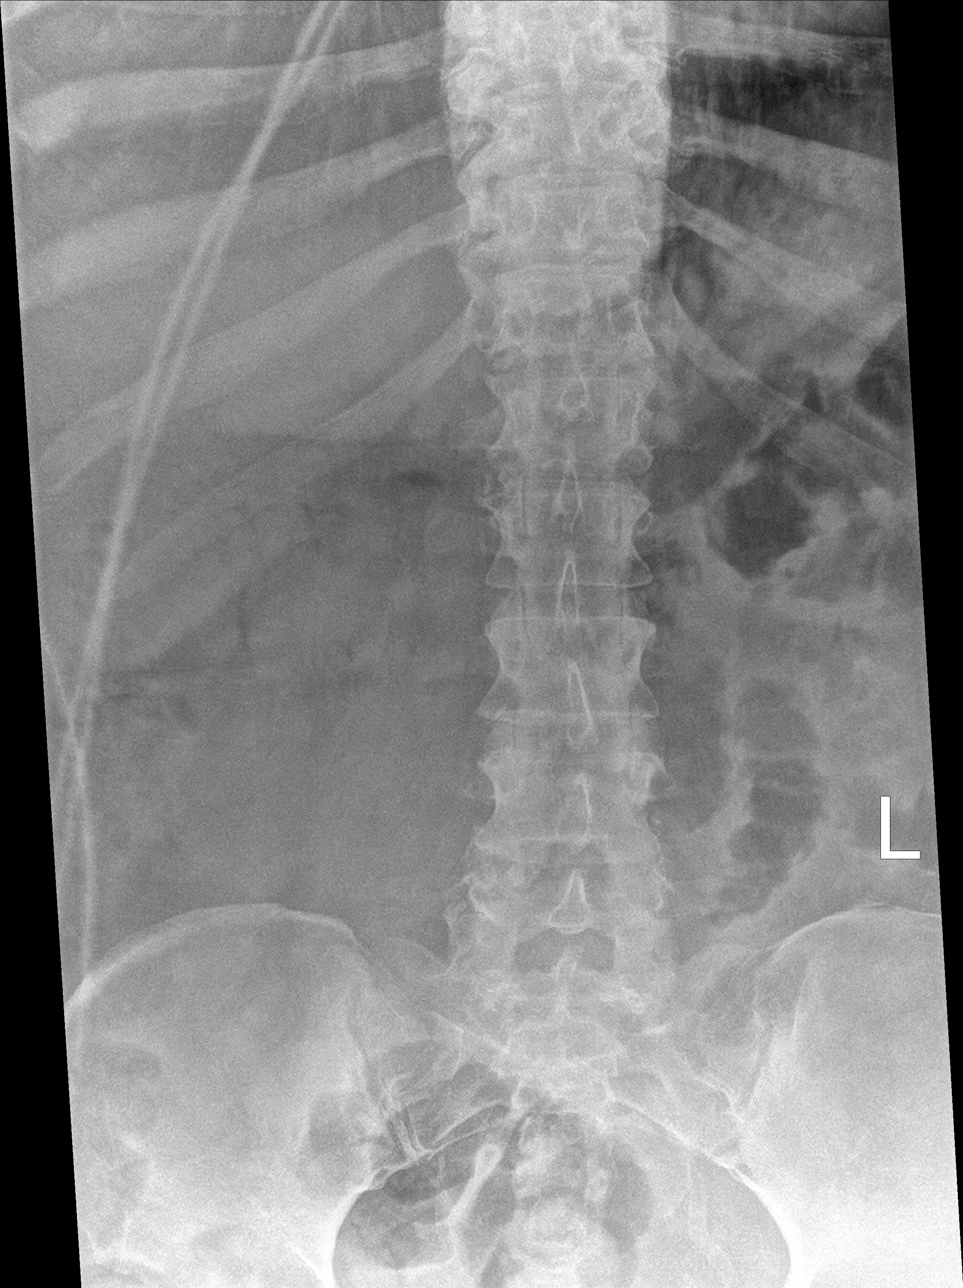

[l-spine lat]
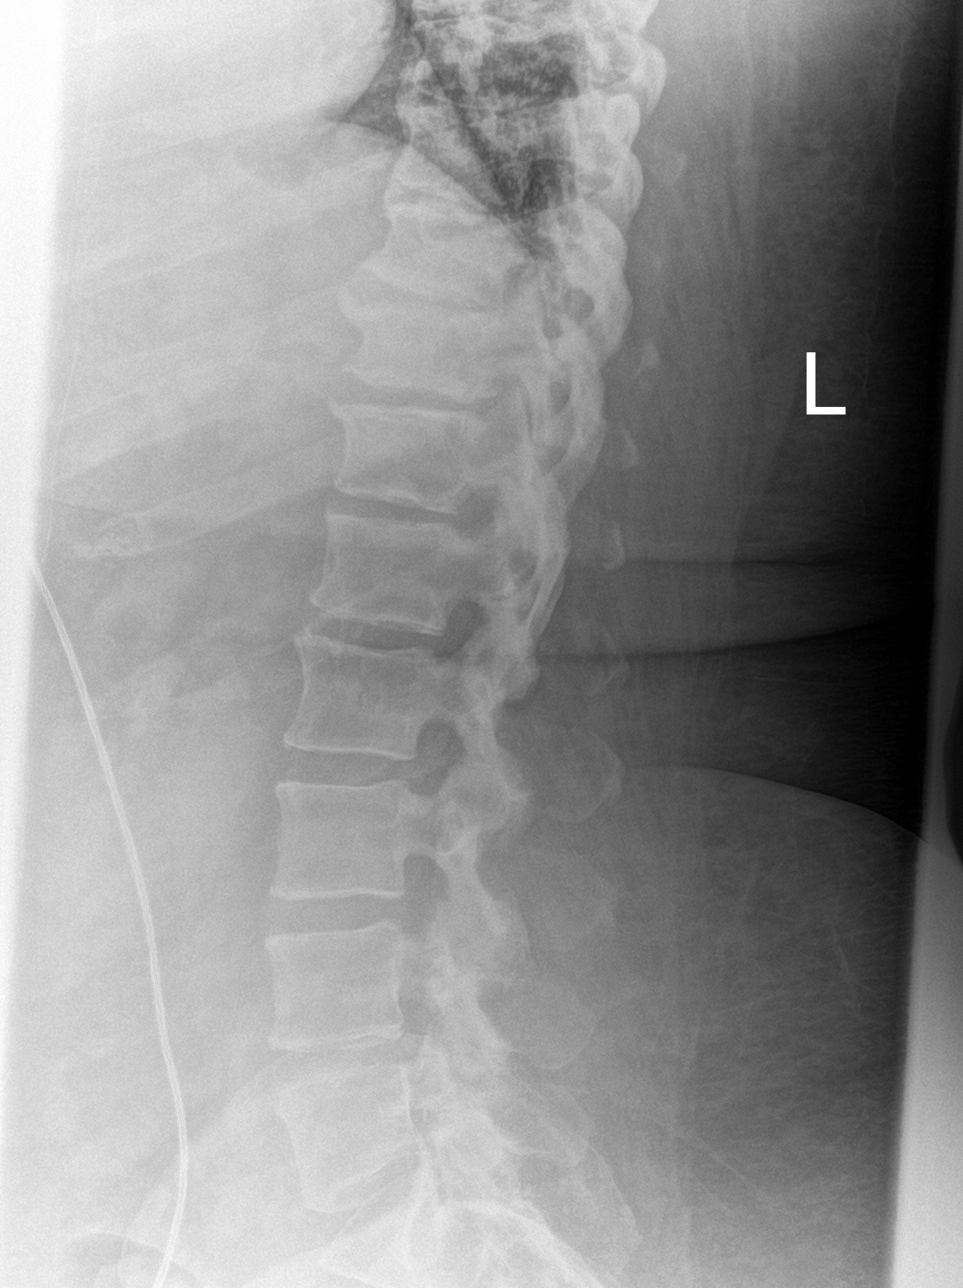

[l-spine spot]
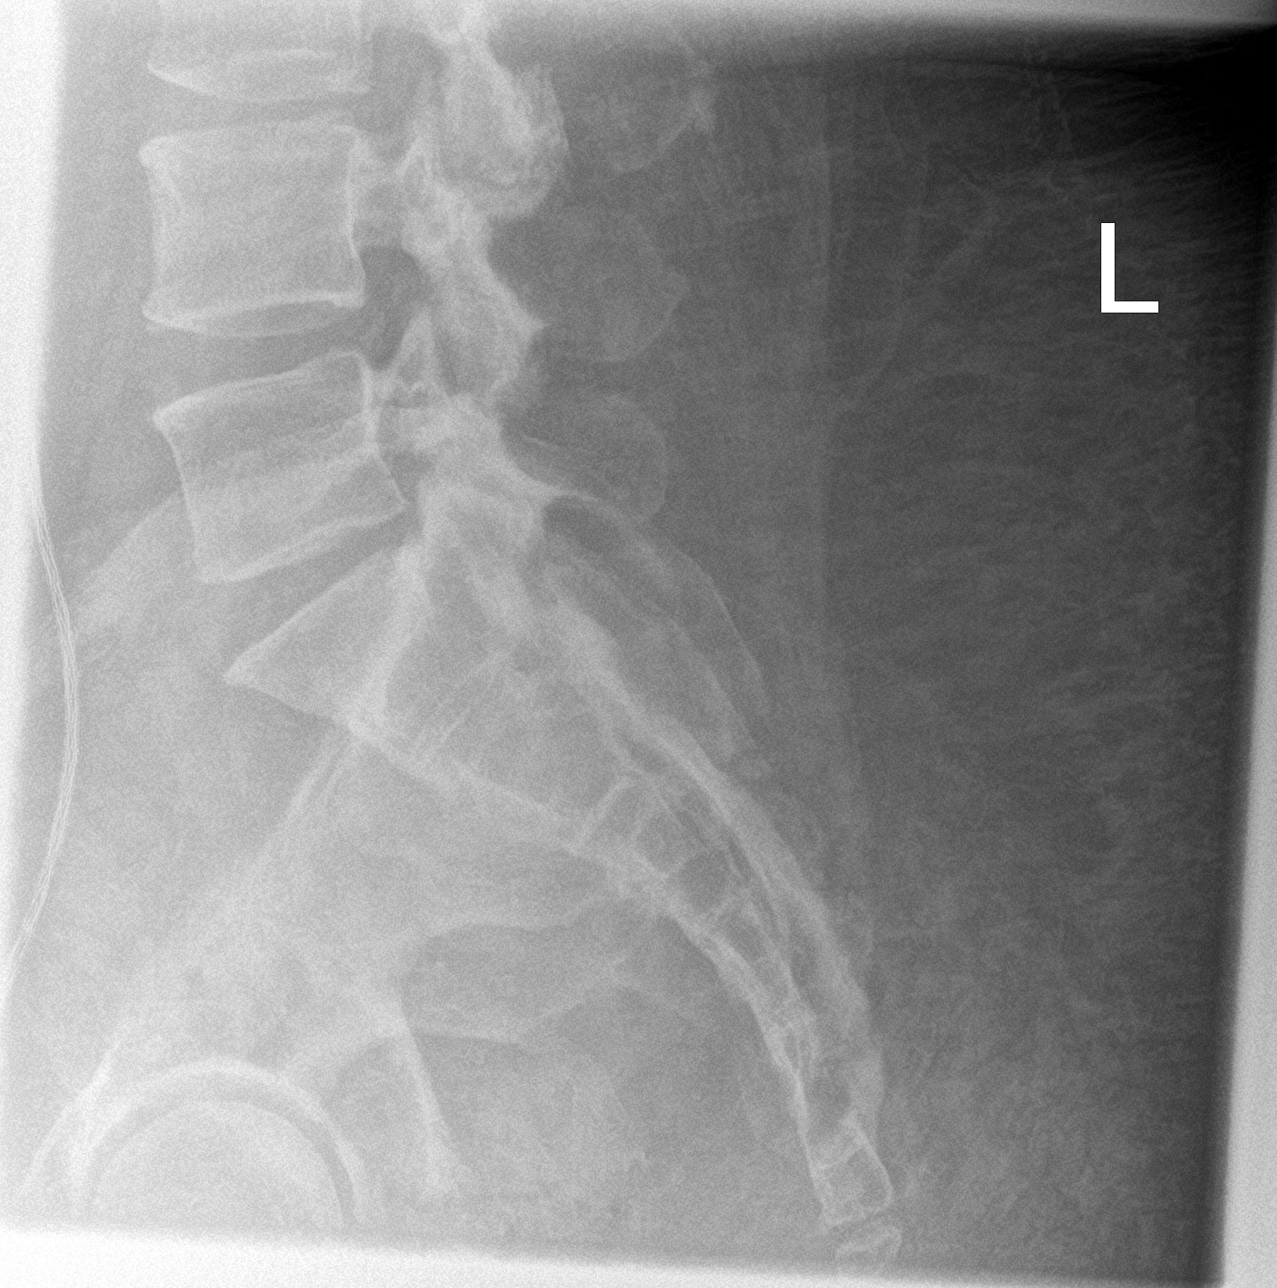

[3 of 3 positions shown; findings below may reference images not displayed]

FINDINGS: The alignment is maintained. Vertebral body heights are normal.
There is no listhesis. The posterior elements are intact. Disc
spaces are preserved. No fracture. Multilevel facet hypertrophy.
Sacroiliac joints are symmetric with mild degenerative change.
IMPRESSION: No fracture or subluxation of the lumbar spine.

## 2022-05-27 MED ORDER — SODIUM ZIRCONIUM CYCLOSILICATE 10 G PO PACK
10.0000 g | PACK | Freq: Once | ORAL | Status: AC
Start: 2022-05-27 — End: 2022-05-27
  Administered 2022-05-27: 10 g via ORAL
  Filled 2022-05-27: qty 1

## 2022-05-27 MED ORDER — SODIUM ZIRCONIUM CYCLOSILICATE 10 G PO PACK
10.0000 g | PACK | Freq: Every day | ORAL | Status: DC
  Filled 2022-05-27: qty 1

## 2022-05-27 MED ORDER — ALBUTEROL SULFATE HFA 108 (90 BASE) MCG/ACT IN AERS
2.0000 | INHALATION_SPRAY | Freq: Four times a day (QID) | RESPIRATORY_TRACT | Status: DC | PRN
Start: 2022-05-27 — End: 2022-05-27

## 2022-05-27 NOTE — Progress Notes (Signed)
PROGRESS NOTE   Subjective/Complaints:   Pt reports peeing a little more- but still feels fluid overloaded.  Wearing CPAP to sleep- slept GREAT with trazodone.  Doesn't smoke, but wants to go off floor to get "air".  Will d/w therapy if family OK to take off floor.    ROS:   Pt denies SOB except when laying on back/(+) orthopnea; , abd pain, CP, N/V/C/D, and vision changes   Objective:   No results found. Recent Labs    05/25/22 0401 05/26/22 0654  WBC 13.9* 11.0*  HGB 9.7* 10.2*  HCT 31.2* 32.1*  PLT 415* 407*   Recent Labs    05/26/22 0654 05/27/22 0537  NA 139 142  K 4.6 5.7*  CL 107 105  CO2 26 27  GLUCOSE 92 89  BUN 32* 41*  CREATININE 1.51* 1.83*  CALCIUM 8.8* 8.9    Intake/Output Summary (Last 24 hours) at 05/27/2022 0836 Last data filed at 05/26/2022 2141 Gross per 24 hour  Intake 900 ml  Output --  Net 900 ml        Physical Exam: Vital Signs Blood pressure (!) 165/98, pulse (!) 101, temperature 97.6 F (36.4 C), temperature source Oral, resp. rate 18, height '5\' 2"'$  (1.575 m), weight (!) 154.6 kg, SpO2 97 %.    General: awake, alert, appropriate, sleeping on L side; wearing CPAP- just woke up; NAD HENT: conjugate gaze; oropharynx moist- CPAP mask marks on face CV: regular  to borderline tachycardic rate; no JVD Pulmonary: improved breath sounds- but still decreased at bases- no W/R/R GI: soft, NT, ND, (+)BS- protuberant Psychiatric: appropriate Neurological: Ox3  Musculoskeletal:        General: No tenderness.     Cervical back: No tenderness.     Right lower leg: Edema present.     Comments: Right foot surgical dressing dry and intact; left BKA well healed, shrinker in place L BKA socket appears loose with increased space around residual limb Tone normal No ROM deficits noted Mild swelling R knee, no redness, mild joint line tenderness  Neurological:     General: No focal  deficit present.     Mental Status: She is alert and oriented to person, place, and time.     Sensory: No sensory deficit.     Coordination: Coordination normal.     Comments: Follows commands and answers questions Strength 5/5 in b/l UE Strength 4/5 R hip flexion and knee extension Strength 5/5 L hip flexion and 4+/5 knee extension    Assessment/Plan: 1. Functional deficits which require 3+ hours per day of interdisciplinary therapy in a comprehensive inpatient rehab setting. Physiatrist is providing close team supervision and 24 hour management of active medical problems listed below. Physiatrist and rehab team continue to assess barriers to discharge/monitor patient progress toward functional and medical goals  Care Tool:  Bathing    Body parts bathed by patient: Right arm, Left arm, Chest, Abdomen, Right upper leg, Left upper leg   Body parts bathed by helper: Front perineal area, Buttocks Body parts n/a: Right lower leg, Left lower leg   Bathing assist Assist Level: Moderate Assistance - Patient 50 - 74%  Upper Body Dressing/Undressing Upper body dressing   What is the patient wearing?: Bra, Pull over shirt    Upper body assist Assist Level: Minimal Assistance - Patient > 75%    Lower Body Dressing/Undressing Lower body dressing    Lower body dressing activity did not occur: Safety/medical concerns (Left prosthesis and RLE NWB from recent surgery (Right foot bandaged)) What is the patient wearing?: Pants     Lower body assist Assist for lower body dressing: Maximal Assistance - Patient 25 - 49%     Toileting Toileting    Toileting assist Assist for toileting: Total Assistance - Patient < 25%     Transfers Chair/bed transfer  Transfers assist     Chair/bed transfer assist level: Moderate Assistance - Patient 50 - 74%     Locomotion Ambulation   Ambulation assist   Ambulation activity did not occur: Safety/medical concerns          Walk 10  feet activity   Assist  Walk 10 feet activity did not occur: Safety/medical concerns        Walk 50 feet activity   Assist Walk 50 feet with 2 turns activity did not occur: Safety/medical concerns         Walk 150 feet activity   Assist Walk 150 feet activity did not occur: Safety/medical concerns         Walk 10 feet on uneven surface  activity   Assist Walk 10 feet on uneven surfaces activity did not occur: Safety/medical concerns         Wheelchair     Assist Is the patient using a wheelchair?: Yes Type of Wheelchair: Manual    Wheelchair assist level: Supervision/Verbal cueing Max wheelchair distance: 10'    Wheelchair 50 feet with 2 turns activity    Assist        Assist Level: Maximal Assistance - Patient 25 - 49%   Wheelchair 150 feet activity     Assist      Assist Level: Total Assistance - Patient < 25%   Blood pressure (!) 165/98, pulse (!) 101, temperature 97.6 F (36.4 C), temperature source Oral, resp. rate 18, height '5\' 2"'$  (1.575 m), weight (!) 154.6 kg, SpO2 97 %.  Medical Problem List and Plan: 1. Functional deficits secondary to revision to right foot ray amputation due to osteomyelitis R foot and recent admission for HHS treatment             -patient may  shower please cover surgical incision             -ELOS/Goals: ELOS 7 to 10 days             -Con't CIR- PT and OT- NWB on R foot 2.  Antithrombotics: -DVT/anticoagulation:  Pharmaceutical: Lovenox 80 mg daily             -antiplatelet therapy: Aspirin 81 mg daily 3. Pain Management: Tylenol as needed --Neurontin 900 mg 3 times daily, Duloxetine '60mg'$  daily             --Oxycodone 7.5/325 every 6 hours as needed  6/21- pain regimen tolerable- con't regimen and titrate as required- asked pt to pre-treat before AM/pm therapies 4. Mood/Sleep: LCSW to evaluate and provide emotional support             -Bipolar disorder: Continue Cymbalta DR 60 mg daily              -antipsychotic agents: n/a 5. Neuropsych/cognition: This patient is  capable of making decisions on her own behalf. 6. Skin/Wound Care: Skin care checks             --monitor right foot surgical incision             --continue Augmentin through Friday 6/23 7. Fluids/Electrolytes/Nutrition: Routine I's and O's and follow-up chemistries 8: Gout, acute flare right knee: improving, Continue colchicine 0.3 mg daily 9: Revison of right first ray amputation 1/16>>NWB             --follow-up after discharge with podiatry, Dr. Posey Pronto             -Continue Augmentin until 6/23             --dressing changes as per podiatry 10: DM-2: continue sliding scale insulin with meals and at bedtime             --continue meal coverage NovoLog 20 units             --continue Semglee 50 units twice daily             --follow-up endocrinology/Dr. Golva  6/22- CBGs 87-111- looking grea-t con't regimen 11: Diastolic heart failure: stable, monitor symptoms.  Daily weight.             -She takes home torsemide, it was held due to AKI.  Consider restarted when appropriate   6/21- will restart Torsemide 20 mg in AM and pm and monitor BMP/weight- might need CXR tomorrow  6/22- will stop Torsemide since Cr bumped a lot to 1.83- and BUN up to 41 from 32- did lose ~ 4 lbs overnight with peeing. Wil get CXR to f/u on fluid status.  12: Hypertension: continue losartan 50 mg daily 13: COPD: continue Yupelri nebulizer daily, Dulera inhaler twice daily 14: Acute kidney injury/CKD stage III: monitor serum BUN and creatinine. Last Cr 1.53 15: Leukocytosis: continue Augmentin and monitor CBC, incision, fever  6/21- WBC down to 11k- will con't to monitor clinically 16: Anemia: normal red blood cell indices.  Follow-up CBC. Last HGB 9.7 17: Left BKA four years ago; requesting [facility] referral for new shrinker 18: OSA: uses cpap at night with O2 2L, continue 19. Anxiety  6/21- will get Neuropsych involved if need be   20. Insomnia  6/21- will restart home Melatonin 10 mg QHS  6/22- slept great with getting trazodone- will con't 21. Hyperkalemia  6/22- K+ 5.7- will give Lokelma 10g x1 and recheck labs in AM 22. AKI  6/22- Cr up to 1.83 from 1.5 and BUN 42 from 32- will need to stop Torsemide and monitor daily weights 23. Dispo  6/22- pt asking for off floor pass- will d/w PT   I spent a total of  39  minutes on total care today- >50% coordination of care- due to d/w pharmacy about Hyperkalemia and Torsemide.        LOS: 2 days A FACE TO FACE EVALUATION WAS PERFORMED  Kathlynn Swofford 05/27/2022, 8:36 AM

## 2022-05-27 NOTE — Progress Notes (Signed)
Orthopedic Tech Progress Note Patient Details:  [name] 1976-05-13 [id number] Called in order to [facility] for Shrinker Patient ID: [name], female   DOB: November 14, 1976, 46 y.o.   MRN: [id number]  [name] 05/27/2022, 11:26 AM

## 2022-05-27 NOTE — Progress Notes (Signed)
RT placed patient on BIPAP HS. 3L O2 bleed in needed. Patient tolerating well at this time.

## 2022-05-27 NOTE — Progress Notes (Signed)
Occupational Therapy Session Note  Patient Details  Name: [name] MRN: [id number] Date of Birth: 1976/11/25  Today's Date: 05/27/2022 OT Individual Time: 1345-1428 OT Individual Time Calculation (min): 43 min    Short Term Goals: Week 1:  OT Short Term Goal 1 (Week 1): STG = LTG due to ELOS  Skilled Therapeutic Interventions/Progress Updates:    Pt sitting in PWC upon arrival. Pt navigated cluttered hallway with [place] to access gym. BUE therex with 3# and 4# bar and 4# barbell. Exercise: chest presses3x10, overhead presses3x10, combo chest press/overhead press 3x10, biceps curls 3x12. Pt issued plastic tube and demonstrated stretching/flexibility exercises to improve joint mobility and trunk mobility. Pt returned to room and remained in [place]. All needs within reach.  Therapy Documentation Precautions:  Precautions Precautions: Fall, Other (comment) Precaution Comments: hx of L BKA, prosthesis in room Restrictions Weight Bearing Restrictions: Yes RLE Weight Bearing: Non weight bearing Other Position/Activity Restrictions: LLE prosthetic in room  Pain:  Pt denies pain this afternoon    Therapy/Group: Individual Therapy  Leroy Libman 05/27/2022, 2:29 PM

## 2022-05-27 NOTE — Progress Notes (Signed)
Inpatient Rehabilitation Care Coordinator Assessment and Plan Patient Details  Name: [name] MRN: [id number] Date of Birth: [birth year]  Today's Date: 05/27/2022  Hospital Problems: Principal Problem:   Status post transmetatarsal amputation of right foot [facility])  Past Medical History:  Past Medical History:  Diagnosis Date   Abscess of groin, left    Acute on chronic diastolic (congestive) heart failure ([place]) 02/05/2022   Acute on chronic respiratory failure with hypoxia ([place]) 04/11/2022   Acute osteomyelitis of ankle or foot, left ([place]) 01/31/2018   Acute respiratory failure with hypoxia and hypercarbia ([place]) 06/15/2020   Alveolar hypoventilation    Amputation stump infection ([place]) 04/09/2018   Anemia    not on iron pill   Asthma    Bipolar 2 disorder ([name])    Candidiasis of vagina 05/15/2022   Carpal tunnel syndrome on right    recurrent   Cellulitis 08/2010-08/2011   Chest pain with low risk for cardiac etiology 05/11/2021   Chronic pain    COPD (chronic obstructive pulmonary disease) ([name])    Symbicort daily and Proventil as needed   Costochondritis    De Quervain's tenosynovitis, bilateral 11/01/2015   Depression    Diabetes mellitus type II, uncontrolled 2000   Type 2, Uncontrolled.Takes Lantus daily.Fasting blood sugar runs 150   Diabetic ulcer of right foot ([place]) 12/19/2021   Drug-seeking behavior    Elevated troponin    Exposure to mold 04/21/2020   GERD (gastroesophageal reflux disease)    takes Pantoprazole and Zantac daily   HLD (hyperlipidemia)    takes Atorvastatin daily   Hypertension    takes Lisinopril and Coreg daily   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[place]) 04/11/2019   Left shoulder pain 06/23/2019   Morbid obesity ([place])    Morbid obesity with BMI of 60.0-69.9, adult ([place]) 04/15/2021   Nocturia    Nonobstructive atherosclerosis of coronary artery 11/26/2020   11/26/20 Coronary angiogram: 1.  Mild diffuse proximal LAD stenosis with no evidence of obstructive disease (20  to 30% diffuse stenosis);  2.  Left dominant circumflex with widely patent obtuse marginal branches and left PDA branch, no significant stenoses present  3.  Nondominant RCA with mild diffuse nonobstructive stenosis    OSA on CPAP    Peripheral neuropathy    takes Gabapentin daily   Pneumonia    "walking" several yrs ago and as a baby (12/05/2018)   Pneumonia due to COVID-19 virus 04/14/2021   Primary osteoarthritis of first carpometacarpal joint of left hand 07/30/2016   Rectal fissure    Restless leg    Right carpal tunnel syndrome 09/01/2011   SVT (supraventricular tachycardia) ([place])    Syncope 02/25/2016   Thrombocytosis 04/11/2022   Urinary frequency    Urinary incontinence 10/23/2020   UTI (urinary tract infection) 04/15/2021   Varicose veins    Right medial thigh and Left leg    Past Surgical History:  Past Surgical History:  Procedure Laterality Date   AMPUTATION Left 02/01/2018   Procedure: LEFT FOURTH AND 5TH TOE RAY AMPUTATION;  Surgeon: [name], MD;  Location: [place];  Service: Orthopedics;  Laterality: Left;   AMPUTATION Left 03/03/2018   Procedure: LEFT BELOW KNEE AMPUTATION;  Surgeon: [name], MD;  Location: [place];  Service: Orthopedics;  Laterality: Left;   AMPUTATION Right 05/21/2022   Procedure: REVISION AMPUTATION RAY 1ST;  Surgeon: [name], DPM;  Location: [place];  Service: Podiatry;  Laterality: Right;   CARPAL TUNNEL RELEASE Bilateral  CESAREAN SECTION  2007   CORONARY ANGIOGRAPHY N/A 11/26/2020   Procedure: CORONARY ANGIOGRAPHY;  Surgeon: Sherren Mocha, MD;  Location: [place] CV LAB;  Service: Cardiovascular;  Laterality: N/A;   FOOT AMPUTATION Right    INCISION AND DRAINAGE ABSCESS Left 10/16/2021   Procedure: INCISION AND DRAINAGE ABSCESS;  Surgeon: Dwan Bolt, MD;  Location: [place];  Service: General;  Laterality: Left;   INCISION AND DRAINAGE PERIRECTAL ABSCESS Left 05/18/2019   Procedure: IRRIGATION AND DEBRIDEMENT OF PANNIS  ABSCESS, POSSIBLE DEBRIDEMENT OF BUTTOCK WOUND;  Surgeon: Donnie Mesa, MD;  Location: [place];  Service: General;  Laterality: Left;   IRRIGATION AND DEBRIDEMENT BUTTOCKS Left 05/17/2019   Procedure: IRRIGATION AND DEBRIDEMENT BUTTOCKS;  Surgeon: Donnie Mesa, MD;  Location: [place];  Service: General;  Laterality: Left;   KNEE ARTHROSCOPY Right 07/17/2010   LEFT HEART CATHETERIZATION WITH CORONARY ANGIOGRAM N/A 07/27/2012   Procedure: LEFT HEART CATHETERIZATION WITH CORONARY ANGIOGRAM;  Surgeon: Sherren Mocha, MD;  Location: [facility] CATH LAB;  Service: Cardiovascular;  Laterality: N/A;   MASS EXCISION N/A 06/29/2013   Procedure:  WIDE LOCAL EXCISION OF POSTERIOR NECK ABSCESS;  Surgeon: Ralene Ok, MD;  Location: [place];  Service: General;  Laterality: N/A;   REPAIR KNEE LIGAMENT Left    "fixed ligaments and chipped patella"   right transmetatarsal amputation      Social History:  reports that she quit smoking about 16 years ago. Her smoking use included cigarettes. She has a 3.75 pack-year smoking history. She has never used smokeless tobacco. She reports that she does not currently use drugs. She reports that she does not drink alcohol.  Family / Support Systems Marital Status: Divorced How Long?: 11 months Patient Roles: Parent Spouse/Significant Other: Divorced Children: 1 89 yr old dtr that Other Supports: CAP/DA aide- 5 days per week/8hrs Anticipated Caregiver: Aide and dtr in evening/weekends Ability/Limitations of Caregiver: Pt dtr will return to school Caregiver Availability: 24/7 Family Dynamics: Pt lives with her 44 y.o. dtr  Social History Preferred language: English Religion: None Cultural Background: Pt has worked blue collar jobs. Education: 10th grade Health Literacy - How often do you need to have someone help you when you read instructions, pamphlets, or other written material from your doctor or pharmacy?: Never Writes: Yes Employment Status: Disabled Date  Retired/Disabled/Unemployed: 2005 (last worked); SSDI began in 2010 Public relations account executive Issues: Denies Guardian/Conservator: N/A   Abuse/Neglect Abuse/Neglect Assessment Can Be Completed: Yes Physical Abuse: Denies Verbal Abuse: Denies Sexual Abuse: Denies Exploitation of patient/patient's resources: Denies Self-Neglect: Denies  Patient response to: Social Isolation - How often do you feel lonely or isolated from those around you?: Never  Emotional Status Pt's affect, behavior and adjustment status: Pt in good spirits at time of visit. Recent Psychosocial Issues: Denies Psychiatric History: Pt admits to psych hx in which she has had 4 psych hospitalizations (2xs at [facility] behavioral health; and 2xs in [place]). She admits that she has had counseling and taken meds in past. States she last took medication 17 yrs ago. Does not believe the medication works for her. Substance Abuse History: Denies  Patient / Family Perceptions, Expectations & Goals Pt/Family understanding of illness & functional limitations: Pt has general understandinf of care needs Premorbid pt/family roles/activities: Assistance with ADLs/IADLs Anticipated changes in roles/activities/participation: Continued assistance with ADLs/IADLs Pt/family expectations/goals: pt goal is wo rk on trasnferring since it has been difficult with being NWB.  Community [employer] Agencies: None Premorbid Home Care/DME Agencies: Other (Comment) ([facility] for aide services) Transportation available at discharge: TBD Is the patient able to respond to transportation needs?: Yes In the past 12 months, has lack of transportation kept you from medical appointments or from getting medications?: No In the past 12 months, has lack of transportation kept you from meetings, work, or from getting things needed for daily living?: No Resource referrals recommended: Neuropsychology  Discharge Planning Living Arrangements:  Children Support Systems: Children, Home care staff Type of Residence: Private residence Insurance Resources: Medicaid (specify county) ([place]) Museum/gallery curator Resources: [employer] Financial Screen Referred: No Living Expenses: Education officer, community Management: Patient Does the patient have any problems obtaining your medications?: No Home Management: Pt reports her aide helps with meal prep and housecleaning. Reports her dtr cooks dinner. States she will cook sometimes but hits her foot often when in the kitchen so avoids being in there now. Patient/Family Preliminary Plans: No changes Care Coordinator Barriers to Discharge: Decreased caregiver support, Insurance for SNF coverage, Lack of/limited family support Care Coordinator Anticipated Follow Up Needs: HH/OP Expected length of stay: 7-10 days  Clinical Impression SW met with pt in room to introduce self, explain role, and discuss discharge process. Pt is not a English as a second language teacher. HCPOa is currently her sister Abbe [place] but states she would like to change this to her mother since she is familiar with her medical hx. SW informed pt assigned RN requesting chaplain consult. DME: electric w/c, 3in1 BSC< x2, RW, shower bench, manual w/c, and oxygen and bipap-through [employer].   Andrey Hoobler A Margaretmary Prisk 05/27/2022, 3:25 PM

## 2022-05-27 NOTE — Progress Notes (Signed)
Physical Therapy Session Note  Patient Details  Name: [name] MRN: [id number] Date of Birth: June 20, 1976  Today's Date: 05/27/2022 PT Individual Time: 0900-1015 PT Individual Time Calculation (min): 75 min   Short Term Goals: Week 1:  PT Short Term Goal 1 (Week 1): =LTG due to ELOS  Skilled Therapeutic Interventions/Progress Updates:    Pt received seated EOB, agreeable to PT session. Pt reports pain is controlled this AM, no complaints of pain during session. Xray technicians in room to perform chest xray. Assisted pt with transferring back into bed for xray. Sit to supine mod A needed for BLE management. Assisted pt with positioning pt in upright position in bed for chest xray. After xray performed pt returned to sitting EOB with min A needed for trunk elevation, HOB elevated and use of bedrail. Pt reports urgent need to toilet. Slide board transfer bed to bari [facility] with mod A. Pt is total A for doffing of pants while seated on BSC via lateral leans. Pt able to continently void while seated on BSC, dependent for pericare and donning of pants. Slide board transfer [facility] to [place] with assist x 2 due to near fall during transfer due to SB sliding. With assist x 2 pt able to safely complete transfer to [facility]. Pt left seated in PWC in room with needs in reach at end of session.  Therapy Documentation Precautions:  Precautions Precautions: Fall, Other (comment) Precaution Comments: hx of L BKA, prosthesis in room Restrictions Weight Bearing Restrictions: Yes RLE Weight Bearing: Non weight bearing Other Position/Activity Restrictions: LLE prosthetic in room      Therapy/Group: Individual Therapy   Excell Seltzer, PT, DPT, CSRS 05/27/2022, 2:51 PM

## 2022-05-27 NOTE — Progress Notes (Signed)
Patient ID: [name], female   DOB: Nov 10, 1976, 46 y.o.   MRN: [id number]  SW made efforts to meet with pt to complete assessment, but pt not in room as in therapy. SW left SOS in room. SW will follow-up to complete, and will continue to assess for d/c needs.   Loralee Pacas, MSW, [place] Office: [phone] Cell: [phone] Fax: [phone]

## 2022-05-27 NOTE — Progress Notes (Signed)
05/27/22 1730  Clinical Encounter Type  Visited With Patient  Visit Type Initial (Advance Directive Education)  Referral From Physician;Nurse Courtney Heys, MD; Delia Heady, RN)  Consult/Referral To Chaplain Melvenia Beam)  Recommendations Advance Directive Education   Chaplain responded to spiritual care consultation requested by Courtney Heys, MD; Barnabas Lister, RN requesting assistance with Advance Directive preparation for Ms. Baker Pierini.   Ms. Fulwider indicated that she intends to list her mother, Sameka Bagent [phone] as her [place], and sister, Richard Miu [phone], as her alternate.   Chaplain provided the Advance Directive packet as well as education on Advance Directives-documents an individual completes to communicate their health care directions in advance of a time when they may need them. Chaplain informed Ms. Bakke the documents which may be completed here in the hospital are the Living Will and [place].   Chaplain informed patient that the [place] is a legal document in which an individual names another person, as their [place], to make health care decisions when the individual is not able to make them for themselves. The Health Care Agent's function can be temporary or permanent depending on her ability to make and communicate those decisions independently.   Chaplain informed Ms. Brasil in the absence of a [place], the state of [place] directs health care providers to look to the following individuals in the order listed: legal guardian; an attorney-in-fact under a general power of attorney (POA) if that POA includes the right to make health care decisions; person's spouse; a 14 of her children; a 59 of adult brothers and sisters; or an individual who has an established relationship with you, who is acting in good faith and who can  convey your wishes.  If none of these persons are available or willing to make medical decisions on a patient's behalf, the law allows the patient's doctor to make decisions for them as long as another doctor agrees with those decisions.  Chaplain also informed the patient that the Health Care agent has no decision-making authority over any affairs other than those related to her medical care.   The chaplain further educated Ms. Heeren that a Living Will is a legal document that allows her desires not to receive life-prolonging measures in the event that she has a condition that is incurable and will result in her death in a short period of time; they are unconscious, and doctors are confident that she will not regain consciousness; and/or she has advanced dementia or other substantial and irreversible loss of mental function.   The chaplain informed Ms. Sliwinski that life-prolonging measures are medical treatments that would only serve to postpone death, including breathing machines, kidney dialysis, antibiotics, artificial nutrition and hydration (tube feeding), and similar forms of treatment and that if an individual is able to express their wishes, they may also make them known without the use of a Living Will, but in the event that she is not able to express her wishes, a Living Will allows medical providers and the her family and friends to ensure that they are not making decisions on the her behalf, but rather serving as the her voice to convey decisions that she has already made.   Ms. Creely is aware that the decision to create an advance directive is hers alone and she may choose not to complete the documents or may choose to complete one portion or both.  Ms. Spring was informed that she can revoke the documents at any time by striking through them and writing void or by completing new documents, but that it is also advisable that the individual verbally notify interested parties that her wishes have  changed.   Ms. Barraco is also aware that the document must be signed in the presence of a notary public and two witnesses and that this can be done while the patient is still admitted to the hospital or after discharge in the community. If they decide to complete Advance Directives after being discharged from the hospital, they have been advised to notify all interested parties and to provide those documents to their physicians and loved ones in addition to bringing them to the hospital in the event of another hospitalization.   The chaplain informed the Ms. Ransome that if she desires to proceed with completing Advance Directive Documentation while she is still admitted, notary services are typically available at [facility] between the hours of 1:00 and 3:30 Monday-Thursday.   When the patient is ready to have these documents completed, the patient should request that their nurse place a spiritual care consult and indicate that the patient is ready to have their advance directives notarized so that arrangements for witnesses and notary public can be made.  If there is an immediate need to have notarized please page the Chaplain.   Please page spiritual care if the patient desires further education or has questions.   [address] [place], [place]. [place]., [phone].

## 2022-05-27 NOTE — Care Management (Signed)
Inpatient Reading Individual Statement of Services  Patient Name:  [name]  Date:  05/27/2022  Welcome to the [facility].  Our goal is to provide you with an individualized program based on your diagnosis and situation, designed to meet your specific needs.  With this comprehensive rehabilitation program, you will be expected to participate in at least 3 hours of rehabilitation therapies Monday-Friday, with modified therapy programming on the weekends.  Your rehabilitation program will include the following services:  Physical Therapy (PT), Occupational Therapy (OT), Speech Therapy (ST), 24 hour per day rehabilitation nursing, Therapeutic Recreaction (TR), Psychology, Neuropsychology, Care Coordinator, Rehabilitation Medicine, [place], and Other  Weekly team conferences will be held on Tuesdays to discuss your progress.  Your Inpatient Rehabilitation Care Coordinator will talk with you frequently to get your input and to update you on team discussions.  Team conferences with you and your family in attendance may also be held.  Expected length of stay: 7-10 days  Overall anticipated outcome: Minimal Assistance  Depending on your progress and recovery, your program may change. Your Inpatient Rehabilitation Care Coordinator will coordinate services and will keep you informed of any changes. Your Inpatient Rehabilitation Care Coordinator's name and contact numbers are listed  below.  The following services may also be recommended but are not provided by the [place] will be made to provide these services after discharge if needed.  Arrangements include referral to agencies that provide these services.  Your insurance has been verified to be:  Medicaid  Your primary  doctor is:  [place]  Pertinent information will be shared with your doctor and your insurance company.  Inpatient Rehabilitation Care Coordinator:  Cathleen Corti [phone] or [phone]  Information discussed with and copy given to patient by: Rana Snare, 05/27/2022, 10:08 AM

## 2022-05-28 LAB — CBC WITH DIFFERENTIAL/PLATELET
Abs Immature Granulocytes: 0.11 10*3/uL — ABNORMAL HIGH (ref 0.00–0.07)
Basophils Absolute: 0.1 10*3/uL (ref 0.0–0.1)
Basophils Relative: 1 %
Eosinophils Absolute: 0.2 10*3/uL (ref 0.0–0.5)
Eosinophils Relative: 2 %
HCT: 31.6 % — ABNORMAL LOW (ref 36.0–46.0)
Hemoglobin: 9.5 g/dL — ABNORMAL LOW (ref 12.0–15.0)
Immature Granulocytes: 1 %
Lymphocytes Relative: 37 %
Lymphs Abs: 3.6 10*3/uL (ref 0.7–4.0)
MCH: 28.1 pg (ref 26.0–34.0)
MCHC: 30.1 g/dL (ref 30.0–36.0)
MCV: 93.5 fL (ref 80.0–100.0)
Monocytes Absolute: 1 10*3/uL (ref 0.1–1.0)
Monocytes Relative: 10 %
Neutro Abs: 4.8 10*3/uL (ref 1.7–7.7)
Neutrophils Relative %: 49 %
Platelets: 349 10*3/uL (ref 150–400)
RBC: 3.38 MIL/uL — ABNORMAL LOW (ref 3.87–5.11)
RDW: 14.4 % (ref 11.5–15.5)
WBC: 9.7 10*3/uL (ref 4.0–10.5)
nRBC: 0 % (ref 0.0–0.2)

## 2022-05-28 LAB — BASIC METABOLIC PANEL
Anion gap: 10 (ref 5–15)
BUN: 47 mg/dL — ABNORMAL HIGH (ref 6–20)
CO2: 25 mmol/L (ref 22–32)
Calcium: 8.4 mg/dL — ABNORMAL LOW (ref 8.9–10.3)
Chloride: 102 mmol/L (ref 98–111)
Creatinine, Ser: 1.67 mg/dL — ABNORMAL HIGH (ref 0.44–1.00)
GFR, Estimated: 39 mL/min — ABNORMAL LOW (ref >60)
Glucose, Bld: 221 mg/dL — ABNORMAL HIGH (ref 70–99)
Potassium: 4.8 mmol/L (ref 3.5–5.1)
Sodium: 137 mmol/L (ref 135–145)

## 2022-05-28 LAB — GLUCOSE, CAPILLARY
Glucose-Capillary: 197 mg/dL — ABNORMAL HIGH (ref 70–99)
Glucose-Capillary: 200 mg/dL — ABNORMAL HIGH (ref 70–99)
Glucose-Capillary: 131 mg/dL — ABNORMAL HIGH (ref 70–99)
Glucose-Capillary: 194 mg/dL — ABNORMAL HIGH (ref 70–99)

## 2022-05-28 MED ORDER — GABAPENTIN 300 MG PO CAPS
900.0000 mg | ORAL_CAPSULE | Freq: Three times a day (TID) | ORAL | Status: DC
  Administered 2022-05-28 (×2): 900 mg via ORAL
  Filled 2022-05-28 (×2): qty 3

## 2022-05-28 MED ORDER — GABAPENTIN 600 MG PO TABS
900.0000 mg | ORAL_TABLET | Freq: Three times a day (TID) | ORAL | Status: DC
  Administered 2022-05-29 – 2022-06-02 (×9): 900 mg via ORAL
  Filled 2022-05-28 (×18): qty 1.5

## 2022-05-28 NOTE — Progress Notes (Signed)
Subjective:  Patient ID: [name], female    DOB: 08-16-1976,  MRN: [id number]  Patient bedside this afternoon s/p partial first ray revision amputation. Relates doing well. Currently in inpatient rehabilitation.   Past Medical History:  Diagnosis Date   Abscess of groin, left    Acute on chronic diastolic (congestive) heart failure ([name]) 02/05/2022   Acute on chronic respiratory failure with hypoxia ([name]) 04/11/2022   Acute osteomyelitis of ankle or foot, left ([name]) 01/31/2018   Acute respiratory failure with hypoxia and hypercarbia ([name]) 06/15/2020   Alveolar hypoventilation    Amputation stump infection ([name]) 04/09/2018   Anemia    not on iron pill   Asthma    Bipolar 2 disorder ([name])    Candidiasis of vagina 05/15/2022   Carpal tunnel syndrome on right    recurrent   Cellulitis 08/2010-08/2011   Chest pain with low risk for cardiac etiology 05/11/2021   Chronic pain    COPD (chronic obstructive pulmonary disease) ([name])    Symbicort daily and Proventil as needed   Costochondritis    De Quervain's tenosynovitis, bilateral 11/01/2015   Depression    Diabetes mellitus type II, uncontrolled 2000   Type 2, Uncontrolled.Takes Lantus daily.Fasting blood sugar runs 150   Diabetic ulcer of right foot ([name]) 12/19/2021   Drug-seeking behavior    Elevated troponin    Exposure to mold 04/21/2020   GERD (gastroesophageal reflux disease)    takes Pantoprazole and Zantac daily   HLD (hyperlipidemia)    takes Atorvastatin daily   Hypertension    takes Lisinopril and Coreg daily   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[name]) 04/11/2019   Left shoulder pain 06/23/2019   Morbid obesity ([name])    Morbid obesity with BMI of 60.0-69.9, adult ([name]) 04/15/2021   Nocturia    Nonobstructive atherosclerosis of coronary artery 11/26/2020   11/26/20 Coronary angiogram: 1.  Mild diffuse proximal LAD stenosis with no evidence of obstructive disease (20 to 30% diffuse stenosis);  2.  Left dominant circumflex with widely patent  obtuse marginal branches and left PDA branch, no significant stenoses present  3.  Nondominant RCA with mild diffuse nonobstructive stenosis    OSA on CPAP    Peripheral neuropathy    takes Gabapentin daily   Pneumonia    "walking" several yrs ago and as a baby (12/05/2018)   Pneumonia due to COVID-19 virus 04/14/2021   Primary osteoarthritis of first carpometacarpal joint of left hand 07/30/2016   Rectal fissure    Restless leg    Right carpal tunnel syndrome 09/01/2011   SVT (supraventricular tachycardia) ([name])    Syncope 02/25/2016   Thrombocytosis 04/11/2022   Urinary frequency    Urinary incontinence 10/23/2020   UTI (urinary tract infection) 04/15/2021   Varicose veins    Right medial thigh and Left leg      Past Surgical History:  Procedure Laterality Date   AMPUTATION Left 02/01/2018   Procedure: LEFT FOURTH AND 5TH TOE RAY AMPUTATION;  Surgeon: Nadara Mustard, MD;  Location: [facility] OR;  Service: Orthopedics;  Laterality: Left;   AMPUTATION Left 03/03/2018   Procedure: LEFT BELOW KNEE AMPUTATION;  Surgeon: Nadara Mustard, MD;  Location: [facility] OR;  Service: Orthopedics;  Laterality: Left;   AMPUTATION Right 05/21/2022   Procedure: REVISION AMPUTATION RAY 1ST;  Surgeon: Candelaria Stagers, DPM;  Location: [facility] OR;  Service: Podiatry;  Laterality: Right;   CARPAL TUNNEL RELEASE Bilateral    CESAREAN SECTION  2007   CORONARY ANGIOGRAPHY N/A  11/26/2020   Procedure: CORONARY ANGIOGRAPHY;  Surgeon: Tonny Bollman, MD;  Location: [facility] INVASIVE CV LAB;  Service: Cardiovascular;  Laterality: N/A;   FOOT AMPUTATION Right    INCISION AND DRAINAGE ABSCESS Left 10/16/2021   Procedure: INCISION AND DRAINAGE ABSCESS;  Surgeon: Fritzi Mandes, MD;  Location[facility] OR;  Service: General;  Laterality: Left;   INCISION AND DRAINAGE PERIRECTAL ABSCESS Left 05/18/2019   Procedure: IRRIGATION AND DEBRIDEMENT OF PANNIS ABSCESS, POSSIBLE DEBRIDEMENT OF BUTTOCK WOUND;  Surgeon: Manus Rudd, MD;  Location: [facility] OR;   Service: General;  Laterality: Left;   IRRIGATION AND DEBRIDEMENT BUTTOCKS Left 05/17/2019   Procedure: IRRIGATION AND DEBRIDEMENT BUTTOCKS;  Surgeon: Manus Rudd, MD;  Location: [facility] OR;  Service: General;  Laterality: Left;   KNEE ARTHROSCOPY Right 07/17/2010   LEFT HEART CATHETERIZATION WITH CORONARY ANGIOGRAM N/A 07/27/2012   Procedure: LEFT HEART CATHETERIZATION WITH CORONARY ANGIOGRAM;  Surgeon: Tonny Bollman, MD;  Location: [facility] CATH LAB;  Service: Cardiovascular;  Laterality: N/A;   MASS EXCISION N/A 06/29/2013   Procedure:  WIDE LOCAL EXCISION OF POSTERIOR NECK ABSCESS;  Surgeon: Axel Filler, MD;  Location: [facility] OR;  Service: General;  Laterality: N/A;   REPAIR KNEE LIGAMENT Left    "fixed ligaments and chipped patella"   right transmetatarsal amputation          Latest Ref Rng & Units 05/28/2022    5:00 AM 05/26/2022    6:54 AM 05/25/2022    4:01 AM  CBC  WBC 4.0 - 10.5 K/uL 9.7  11.0  13.9   Hemoglobin 12.0 - 15.0 g/dL 9.5  16.1  9.7   Hematocrit 36.0 - 46.0 % 31.6  32.1  31.2   Platelets 150 - 400 K/uL 349  407  415        Latest Ref Rng & Units 05/28/2022    5:00 AM 05/27/2022    5:37 AM 05/26/2022    6:54 AM  BMP  Glucose 70 - 99 mg/dL 096  89  92   BUN 6 - 20 mg/dL 47  41  32   Creatinine 0.44 - 1.00 mg/dL 0.45  4.09  8.11   Sodium 135 - 145 mmol/L 137  142  139   Potassium 3.5 - 5.1 mmol/L 4.8  5.7  4.6   Chloride 98 - 111 mmol/L 102  105  107   CO2 22 - 32 mmol/L 25  27  26    Calcium 8.9 - 10.3 mg/dL 8.4  8.9  8.8      Objective:   Vitals:   05/28/22 0828 05/28/22 1508  BP:  134/86  Pulse:  (!) 101  Resp:  18  Temp:  97.8 F (36.6 C)  SpO2: 97% 91%    General:AA&O x 3. Normal mood and affect   Vascular: DP and PT pulses 2/4 bilateral. Brisk capillary refill to all digits. Pedal hair present   Neruological. Epicritic sensation grossly intact.   Derm: Incision clean dray and intact. Well coapted with no signs of dehiscence or infection.       MSK: MMT 5/5 in dorsiflexion, plantar flexion, inversion and eversion. Normal joint ROM without pain or crepitus.      Assessment & Plan:  Patient was evaluated and treated and all questions answered.  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 Finish out antibiotics.  -Nonweightbearing to the right foot -She will follow-up in our clinic 1 week from discharge -No dressing change  until follow-up.   Louann Sjogren, DPM  Accessible via secure chat for questions or concerns.

## 2022-05-29 LAB — GLUCOSE, CAPILLARY
Glucose-Capillary: 203 mg/dL — ABNORMAL HIGH (ref 70–99)
Glucose-Capillary: 169 mg/dL — ABNORMAL HIGH (ref 70–99)
Glucose-Capillary: 153 mg/dL — ABNORMAL HIGH (ref 70–99)
Glucose-Capillary: 224 mg/dL — ABNORMAL HIGH (ref 70–99)

## 2022-05-29 MED ORDER — INSULIN GLARGINE-YFGN 100 UNIT/ML ~~LOC~~ SOLN
52.0000 [IU] | Freq: Two times a day (BID) | SUBCUTANEOUS | Status: DC
  Administered 2022-05-29 – 2022-06-04 (×12): 52 [IU] via SUBCUTANEOUS
  Filled 2022-05-29 (×15): qty 0.52

## 2022-05-30 LAB — GLUCOSE, CAPILLARY
Glucose-Capillary: 293 mg/dL — ABNORMAL HIGH (ref 70–99)
Glucose-Capillary: 267 mg/dL — ABNORMAL HIGH (ref 70–99)
Glucose-Capillary: 136 mg/dL — ABNORMAL HIGH (ref 70–99)
Glucose-Capillary: 82 mg/dL (ref 70–99)

## 2022-05-30 MED ORDER — TORSEMIDE 20 MG PO TABS
20.0000 mg | ORAL_TABLET | Freq: Every day | ORAL | Status: DC
  Administered 2022-05-30 – 2022-05-31 (×2): 20 mg via ORAL
  Filled 2022-05-30 (×2): qty 1

## 2022-05-30 NOTE — Progress Notes (Signed)
Spoke to patient concerning bowel movement since 6/22. Patient states that she usually goes every couple of days. Informed patient concerning the use of pain medications and slowing bowels. Asked patient if she wanted to get up and use the bathroom and patient stated no and also told nurse tech no as well. Patient currently in bed with BiPAP on. Cletis Media, LPN

## 2022-05-30 NOTE — Progress Notes (Signed)
Occupational Therapy Session Note  Patient Details  Name: [name] MRN: [id number] Date of Birth: 07-30-1976  Today's Date: 05/30/2022 OT Individual Time: 4098-1191 OT Individual Time Calculation (min): 60 min    Short Term Goals: Week 1:  OT Short Term Goal 1 (Week 1): STG = LTG due to ELOS  Skilled Therapeutic Interventions/Progress Updates:    Upon OT arrival, pt seated EOB and RN present. Pt reports she did not sleep well last night. No pain reported. Pt requesting to get cleaned up. Pt completes sponge bath ADL EOB. Pt completes UB bathing with Setup, LB bathing with Min A, donns dress with Min A to pull down fully, LB dressing with Mod A. Pt noted to demonstrate decreased endurance requiring seated rest break throughout. Pt combed hair with Setup. Pt then completes slideboard transfer from bed to [facility] with Min A and increased time required. Pt performs B UE exercises using 4lb dowel including shoulder flex/ext, elbow flex/ext, shoulder abd/add, wrist ext, chest press, overhead press for 3x10 reps. Pt requires rest breaks to complete. Pt limited by decreased ROM and endurance but continues to benefit from OT services to achieve highest level of independence. Pt left in PWC at end of session with all needs met.   Therapy Documentation Precautions:  Precautions Precautions: Fall, Other (comment) Precaution Comments: hx of L BKA, prosthesis in room Restrictions Weight Bearing Restrictions: Yes RLE Weight Bearing: Non weight bearing Other Position/Activity Restrictions: LLE prosthetic in room    Therapy/Group: Individual Therapy  Draysen Weygandt 05/30/2022, 9:00 AM

## 2022-05-31 LAB — BASIC METABOLIC PANEL
Anion gap: 5 (ref 5–15)
BUN: 63 mg/dL — ABNORMAL HIGH (ref 6–20)
CO2: 24 mmol/L (ref 22–32)
Calcium: 8.7 mg/dL — ABNORMAL LOW (ref 8.9–10.3)
Chloride: 105 mmol/L (ref 98–111)
Creatinine, Ser: 1.71 mg/dL — ABNORMAL HIGH (ref 0.44–1.00)
GFR, Estimated: 37 mL/min — ABNORMAL LOW (ref >60)
Glucose, Bld: 103 mg/dL — ABNORMAL HIGH (ref 70–99)
Potassium: 5.3 mmol/L — ABNORMAL HIGH (ref 3.5–5.1)
Sodium: 134 mmol/L — ABNORMAL LOW (ref 135–145)

## 2022-05-31 LAB — CBC WITH DIFFERENTIAL/PLATELET
Abs Immature Granulocytes: 0.03 10*3/uL (ref 0.00–0.07)
Basophils Absolute: 0.1 10*3/uL (ref 0.0–0.1)
Basophils Relative: 1 %
Eosinophils Absolute: 0.2 10*3/uL (ref 0.0–0.5)
Eosinophils Relative: 3 %
HCT: 30.6 % — ABNORMAL LOW (ref 36.0–46.0)
Hemoglobin: 9.5 g/dL — ABNORMAL LOW (ref 12.0–15.0)
Immature Granulocytes: 0 %
Lymphocytes Relative: 46 %
Lymphs Abs: 3.4 10*3/uL (ref 0.7–4.0)
MCH: 29.4 pg (ref 26.0–34.0)
MCHC: 31 g/dL (ref 30.0–36.0)
MCV: 94.7 fL (ref 80.0–100.0)
Monocytes Absolute: 0.7 10*3/uL (ref 0.1–1.0)
Monocytes Relative: 9 %
Neutro Abs: 3.1 10*3/uL (ref 1.7–7.7)
Neutrophils Relative %: 41 %
Platelets: 343 10*3/uL (ref 150–400)
RBC: 3.23 MIL/uL — ABNORMAL LOW (ref 3.87–5.11)
RDW: 14.6 % (ref 11.5–15.5)
WBC: 7.5 10*3/uL (ref 4.0–10.5)
nRBC: 0 % (ref 0.0–0.2)

## 2022-05-31 LAB — GLUCOSE, CAPILLARY
Glucose-Capillary: 193 mg/dL — ABNORMAL HIGH (ref 70–99)
Glucose-Capillary: 303 mg/dL — ABNORMAL HIGH (ref 70–99)
Glucose-Capillary: 347 mg/dL — ABNORMAL HIGH (ref 70–99)

## 2022-05-31 MED ORDER — TORSEMIDE 20 MG PO TABS
40.0000 mg | ORAL_TABLET | Freq: Two times a day (BID) | ORAL | Status: DC
Start: 2022-06-01 — End: 2022-05-31

## 2022-05-31 MED ORDER — TORSEMIDE 20 MG PO TABS
40.0000 mg | ORAL_TABLET | Freq: Two times a day (BID) | ORAL | Status: DC
  Administered 2022-05-31 – 2022-06-04 (×8): 40 mg via ORAL
  Filled 2022-05-31 (×8): qty 2

## 2022-05-31 MED ORDER — SODIUM ZIRCONIUM CYCLOSILICATE 10 G PO PACK
10.0000 g | PACK | Freq: Once | ORAL | Status: AC
  Administered 2022-05-31: 10 g via ORAL
  Filled 2022-05-31: qty 1

## 2022-05-31 MED ORDER — SORBITOL 70 % SOLN: 30.0000 mL | Freq: Once | Status: DC

## 2022-05-31 MED ORDER — SODIUM CHLORIDE 0.9 % IV SOLN: INTRAVENOUS | Status: DC

## 2022-05-31 NOTE — Consult Note (Signed)
Neuropsychological Consultation   Patient:   [name]   DOB:   06/15/1976  MR Number:  [id number]  Location:  [facility] [address] CENTER [id number] [place] [address] Dept: [phone] Loc: [phone]           Date of Service:   05/31/2022  Start Time:   11 AM End Time:   12 PM  Provider/Observer:  Arley Phenix, Psy.D.       Clinical Neuropsychologist       Billing Code/Service: 44010  Chief Complaint:    [name] is a 46 year old female who presented to the emergency department on 05/14/2021 complaining of fatigue and polyuria.  Patient had worse pain related to chronic right foot ulcer.  Significant elevated serum blood glucose noted.  Patient started on insulin infusion etc.  Podiatry, infectious disease and orthopedic surgery were all consulted.  Patient taken to the OR on 6/16 and underwent transmetatarsal amputation.  BKA of either leg.  Patient with past medical history including obstructive sleep apnea with nighttime BiPAP, bipolar disorder, acute kidney injury on top of chronic kidney disease.  Reason for Service:  Patient was referred for neuropsychological consultation due to coping and adjustment issues and monitoring of current mood state.  Below is the HPI for the current admission.  HPI: [name] is a 46 year old female who presented to the emergency department on the morning of 05/14/2022 complaining of fatigue and polyuria.  In addition, she noted worsening pain to her chronic right foot ulcer.  She reported she has had elevated blood sugars over the past several days and her endocrinologist encouraged her to go to the emergency department for further management.  Her serum glucose was 793.  She was started on insulin infusion, IVFs, dextrose. MRI obtained of right foot.  Podiatry, infectious disease and orthopedic surgery consultations obtained.  The patient was taken to the OR on  6/16 and underwent transmetatarsal amputation revision by Dr. Nicholes Rough. Incision closed with staples. Tolerated well.  Dr., Recommended a 7-day course of Augmentin postoperatively for the possibility remaining soft tissue infection.  He anion gap Improved, transitioned to subcutaneous insulin, she remained afebrile with tolerating carb modified diet.  PT and OT evaluations carried out. She reports her pain is under good control. She had OA/gout pain in her R knee, but this is improving. She The patient requires inpatient medicine and rehabilitation evaluations and services for ongoing dysfunction secondary to revision right transmetatarsal amputation.   Significant medical history includes obstructive sleep apnea on nighttime BiPAP, bipolar disorder, acute kidney injury atop chronic kidney disease stage III, previous left above-knee amputation.  Current Status:  Patient was awake and alert and aware of what had transpired.  Patient reports that she is struggled with her previous BKA and is very desperate to save as much of her other foot as possible.  She reports that she did well getting her prosthetic initially but has done more poorly as she started having more troubles with her other foot.  Patient reports that mood has been stable for the most part during her hospital stay and feels like she is healing up after surgery.  Patient is working on transfers but reports she is struggled due to weakness etc.  Patient currently takes Cymbalta and also takes trazodone at night for sleep as well as Neurontin for pain but Neurontin may also help with mood stabilization.  Behavioral Observation: [name]  presents as  a 46 y.o.-year-old Right handed Female who appeared her stated age. her dress was Appropriate and she was Well Groomed and her manners were Appropriate to the situation.  her participation was indicative of Appropriate behaviors.  There were physical disabilities noted.  she displayed an  appropriate level of cooperation and motivation.     Interactions:    Active Appropriate  Attention:   within normal limits and attention span and concentration were age appropriate  Memory:   within normal limits; recent and remote memory intact  Visuo-spatial:  not examined  Speech (Volume):  normal  Speech:   normal; normal  Thought Process:  Coherent and Relevant  Though Content:  WNL; not suicidal and not homicidal  Orientation:   person, place, time/date, and situation  Judgment:   Fair  Planning:   Fair  Affect:    Appropriate  Mood:    Dysphoric  Insight:   Fair  Intelligence:   normal   Medical History:   Past Medical History:  Diagnosis Date   Abscess of groin, left    Acute on chronic diastolic (congestive) heart failure ([name]) 02/05/2022   Acute on chronic respiratory failure with hypoxia ([name]) 04/11/2022   Acute osteomyelitis of ankle or foot, left ([name]) 01/31/2018   Acute respiratory failure with hypoxia and hypercarbia ([name]) 06/15/2020   Alveolar hypoventilation    Amputation stump infection ([name]) 04/09/2018   Anemia    not on iron pill   Asthma    Bipolar 2 disorder ([name])    Candidiasis of vagina 05/15/2022   Carpal tunnel syndrome on right    recurrent   Cellulitis 08/2010-08/2011   Chest pain with low risk for cardiac etiology 05/11/2021   Chronic pain    COPD (chronic obstructive pulmonary disease) ([name])    Symbicort daily and Proventil as needed   Costochondritis    De Quervain's tenosynovitis, bilateral 11/01/2015   Depression    Diabetes mellitus type II, uncontrolled 2000   Type 2, Uncontrolled.Takes Lantus daily.Fasting blood sugar runs 150   Diabetic ulcer of right foot ([name]) 12/19/2021   Drug-seeking behavior    Elevated troponin    Exposure to mold 04/21/2020   GERD (gastroesophageal reflux disease)    takes Pantoprazole and Zantac daily   HLD (hyperlipidemia)    takes Atorvastatin daily   Hypertension    takes Lisinopril and Coreg  daily   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[name]) 04/11/2019   Left shoulder pain 06/23/2019   Morbid obesity ([name])    Morbid obesity with BMI of 60.0-69.9, adult ([name]) 04/15/2021   Nocturia    Nonobstructive atherosclerosis of coronary artery 11/26/2020   11/26/20 Coronary angiogram: 1.  Mild diffuse proximal LAD stenosis with no evidence of obstructive disease (20 to 30% diffuse stenosis);  2.  Left dominant circumflex with widely patent obtuse marginal branches and left PDA branch, no significant stenoses present  3.  Nondominant RCA with mild diffuse nonobstructive stenosis    OSA on CPAP    Peripheral neuropathy    takes Gabapentin daily   Pneumonia    "walking" several yrs ago and as a baby (12/05/2018)   Pneumonia due to COVID-19 virus 04/14/2021   Primary osteoarthritis of first carpometacarpal joint of left hand 07/30/2016   Rectal fissure    Restless leg    Right carpal tunnel syndrome 09/01/2011   SVT (supraventricular tachycardia) ([name])    Syncope 02/25/2016   Thrombocytosis 04/11/2022   Urinary frequency    Urinary incontinence 10/23/2020  UTI (urinary tract infection) 04/15/2021   Varicose veins    Right medial thigh and Left leg          Patient Active Problem List   Diagnosis Date Noted   Status post transmetatarsal amputation of right foot ([name]) 05/25/2022   Hyperkalemia 05/23/2022   Peripheral artery disease, right leg, mild([name]) 05/15/2022   Type 2 diabetes mellitus with hyperosmolar hyperglycemic state (HHS) ([name]) 05/14/2022   Osteomyelitis of foot, right, acute ([name]) 05/14/2022   Wound infection 04/09/2022   Debility 02/05/2022   Adrenal nodule ([name]) 01/07/2022   Iritis    Chronic kidney disease, stage 3a ([name]) 10/19/2021   Diastolic HF (heart failure) ([name]) 10/19/2021   Impaired mobility 07/07/2021   Chronic pain 04/15/2021   Hyperlipidemia associated with type 2 diabetes mellitus ([name]) 01/15/2021   Hypertension associated with type 2 diabetes mellitus ([name]) 01/15/2021    Nonobstructive atherosclerosis of coronary artery 11/26/2020   Urinary incontinence 10/23/2020   Chronic respiratory failure with hypoxia ([name]) 06/14/2020   Constipation 04/03/2020   Dysuria 06/14/2019   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[name]) 04/11/2019   S/P transmetatarsal amputation of foot, right ([name]) 01/25/2019   Allergic rhinitis 03/06/2018   Class 3 severe obesity due to excess calories with serious comorbidity and body mass index (BMI) of 50.0 to 59.9 in adult [facility])    Gout of knee, Right 10/22/2016   Diabetic neuropathy ([name]) 07/14/2016   Vitamin D deficiency 09/05/2015   Recurrent candidiasis of vagina 09/05/2015   Varicose veins of leg with complications 06/11/2015   Restless leg syndrome 10/17/2014   Chronic sinusitis 07/18/2014   Insomnia 08/14/2013   Encounter for chronic pain management 06/30/2013   Chronic pain of both knees 09/01/2011   Insulin dependent type 2 diabetes mellitus ([name]) 05/22/2008   Obesity hypoventilation syndrome ([name]) 05/22/2008   Mood disorder ([name]) 05/22/2008   Obstructive sleep apnea (adult) (pediatric) 05/22/2008   Hypertension 05/22/2008   Asthma, chronic 05/22/2008   GERD 05/22/2008     Psychiatric History:  Patient has been diagnosed with bipolar 2 disorder and follows her psychotropic regimen.  Family Med/Psych History:  Family History  Problem Relation Age of Onset   Diabetes Mother    Hyperlipidemia Mother    Depression Mother    GER disease Mother    Allergic rhinitis Mother    Restless legs syndrome Mother    Heart attack Paternal Uncle    Heart disease Paternal Grandmother    Heart attack Paternal Grandmother    Heart attack Paternal Grandfather    Heart disease Paternal Grandfather    Heart attack Father    Migraines Sister    Cancer Maternal Grandmother        COLON   Hypertension Maternal Grandmother    Hyperlipidemia Maternal Grandmother    Diabetes Maternal Grandmother    Other Maternal Grandfather        GUN SHOT    Anxiety disorder Sister    Asthma Child     Impression/DX:  [name] is a 46 year old female who presented to the emergency department on 05/14/2021 complaining of fatigue and polyuria.  Patient had worse pain related to chronic right foot ulcer.  Significant elevated serum blood glucose noted.  Patient started on insulin infusion etc.  Podiatry, infectious disease and orthopedic surgery were all consulted.  Patient taken to the OR on 6/16 and underwent transmetatarsal amputation.  BKA of either leg.  Patient with past medical history including obstructive sleep apnea with nighttime BiPAP, bipolar disorder,  acute kidney injury on top of chronic kidney disease.  Disposition/Plan:  Today we worked on coping and adjustment issues with her recent transmetatarsal amputation.  Patient with significant metabolic issues.           Electronically Signed   _______________________ Arley Phenix, Psy.D. Clinical Neuropsychologist

## 2022-05-31 NOTE — Progress Notes (Signed)
PROGRESS NOTE   Subjective/Complaints: Said LBM was ~ 2 days ago (per chart was 4 days ago)- and denies constipation.  Pain doing "OK".   Not urinating as well either- per nursing notes-  Cr 1.71 but BUN up to 63  Weight also up 10 kg, which seems like needs to be rechecked.  ROS:  Pt denies SOB, abd pain, CP, N/V/C/D, and vision changes   Objective:   No results found. Recent Labs    05/31/22 0612  WBC 7.5  HGB 9.5*  HCT 30.6*  PLT 343   Recent Labs    05/31/22 0612  NA 134*  K 5.3*  CL 105  CO2 24  GLUCOSE 103*  BUN 63*  CREATININE 1.71*  CALCIUM 8.7*    Intake/Output Summary (Last 24 hours) at 05/31/2022 0846 Last data filed at 05/31/2022 0800 Gross per 24 hour  Intake 120 ml  Output --  Net 120 ml        Physical Exam: Vital Signs Blood pressure 135/68, pulse 100, temperature 98.7 F (37.1 C), temperature source Axillary, resp. rate 16, height 5\' 2"  (1.575 m), weight (!) 162.9 kg, SpO2 94 %.     General: awake, alert, appropriate, sleeping initially with CPAP on; denies issues; NAD HENT: conjugate gaze; oropharynx moist CV: regular rate; no JVD Pulmonary: slightly decreased at bases, but no W/R/R GI: soft, NT, ND, (+)BS- protuberant Psychiatric: appropriate- more flat but just woke up Neurological: alert, but sleepy   Musculoskeletal:        General: No tenderness.     Cervical back: No tenderness.     Right lower leg: Edema present.     Comments: Right foot surgical dressing dry and intact; on TMA;  left BKA well healed, shrinker in place- wearing shuttle lock right now  Neurological:     General: No focal deficit present.     Mental Status: She is alert and oriented to person, place, and time.     Sensory: No sensory deficit.     Coordination: Coordination normal.     Comments: Follows commands and answers questions Strength 5/5 in b/l UE Strength 4/5 R hip flexion and knee  extension Strength 5/5 L hip flexion and 4+/5 knee extension    Assessment/Plan: 1. Functional deficits which require 3+ hours per day of interdisciplinary therapy in a comprehensive inpatient rehab setting. Physiatrist is providing close team supervision and 24 hour management of active medical problems listed below. Physiatrist and rehab team continue to assess barriers to discharge/monitor patient progress toward functional and medical goals  Care Tool:  Bathing    Body parts bathed by patient: Right arm, Left arm, Chest, Abdomen, Right upper leg, Left upper leg, Face   Body parts bathed by helper: Front perineal area, Buttocks, Right lower leg Body parts n/a: Left lower leg   Bathing assist Assist Level: Minimal Assistance - Patient > 75%     Upper Body Dressing/Undressing Upper body dressing   What is the patient wearing?: Dress    Upper body assist Assist Level: Minimal Assistance - Patient > 75%    Lower Body Dressing/Undressing Lower body dressing    Lower body dressing activity  did not occur:  (Left prosthesis and RLE NWB from recent surgery (Right foot bandaged)) What is the patient wearing?: Ace wrap/stump shrinker     Lower body assist Assist for lower body dressing: Moderate Assistance - Patient 50 - 74%     Toileting Toileting    Toileting assist Assist for toileting: Moderate Assistance - Patient 50 - 74%     Transfers Chair/bed transfer  Transfers assist     Chair/bed transfer assist level: Minimal Assistance - Patient > 75%     Locomotion Ambulation   Ambulation assist   Ambulation activity did not occur: Safety/medical concerns          Walk 10 feet activity   Assist  Walk 10 feet activity did not occur: Safety/medical concerns        Walk 50 feet activity   Assist Walk 50 feet with 2 turns activity did not occur: Safety/medical concerns         Walk 150 feet activity   Assist Walk 150 feet activity did not occur:  Safety/medical concerns         Walk 10 feet on uneven surface  activity   Assist Walk 10 feet on uneven surfaces activity did not occur: Safety/medical concerns         Wheelchair     Assist Is the patient using a wheelchair?: Yes Type of Wheelchair: Power    Wheelchair assist level: Independent Max wheelchair distance: >150 ft    Wheelchair 50 feet with 2 turns activity    Assist        Assist Level: Independent   Wheelchair 150 feet activity     Assist      Assist Level: Independent   Blood pressure 135/68, pulse 100, temperature 98.7 F (37.1 C), temperature source Axillary, resp. rate 16, height 5\' 2"  (1.575 m), weight (!) 162.9 kg, SpO2 94 %.  Medical Problem List and Plan: 1. Functional deficits secondary to revision to right foot ray amputation due to osteomyelitis R foot and recent admission for HHS treatment             -patient may  shower please cover surgical incision             -ELOS/Goals: ELOS 7 to 10 days Con't CIR- PT and OT-            - using power w/c, but working mainly on ADLs and transfers- NWB RLE and has L BKA- old 2.  Antithrombotics: -DVT/anticoagulation:  Pharmaceutical: Lovenox 80 mg daily             -antiplatelet therapy: Aspirin 81 mg daily 3. Pain Management: Tylenol as needed --Neurontin 900 mg 3 times daily, Duloxetine 60mg  daily             --Oxycodone 7.5/325 every 6 hours as needed  6/26- pain doing better- con't regimen 4. Mood/Sleep: LCSW to evaluate and provide emotional support             -Bipolar disorder: Continue Cymbalta DR 60 mg daily             -antipsychotic agents: n/a 5. Neuropsych/cognition: This patient is capable of making decisions on her own behalf. 6. Skin/Wound Care: Skin care checks             --monitor right foot surgical incision             --continue Augmentin through Friday 6/23 7. Fluids/Electrolytes/Nutrition: Routine I's and O's and follow-up chemistries 8:  Gout, acute  flare right knee: improving, Continue colchicine 0.3 mg daily 9: Revison of right first ray amputation 1/16>>NWB             --follow-up after discharge with podiatry, Dr. Allena Katz             -Continue Augmentin until 6/23             --dressing changes as per podiatry 10: DM-2: continue sliding scale insulin with meals and at bedtime             --continue meal coverage NovoLog 20 units             --continue Semglee 50 units twice daily             --follow-up endocrinology/Dr. [facility]  CBG (last 3)  Recent Labs    05/30/22 1141 05/30/22 1654 05/30/22 2125  GLUCAP 267* 136* 82  Elevated am CBG increase semglee  6/26- Pt's insulin just increased- CBGs 82-150s- but 1 value 267- will monitor trend.  11: Diastolic heart failure: stable, monitor symptoms.  Daily weight.             -She takes home torsemide, it was held due to AKI.  Consider restarted when appropriate   6/21- will restart Torsemide 20 mg in AM and pm and monitor BMP/weight- might need CXR tomorrow  6/22- will stop Torsemide since Cr bumped a lot to 1.83- and BUN up to 41 from 32- did lose ~ 4 lbs overnight with peeing. Wil get CXR to f/u on fluid status.   6/23- CXR looks good- will not keep on Torsemide, however if has any fluid overload, can give 1x dose of Torsemide 20 mg.   Orthopnea sx after hi intake of fluids yesterday will give torsemide 20mg  this am may need to repeat tonight if pt still has orthopnea    6/26- weight up 10 kg- which doesn't make sense, although possible- will ask for weight to be rechecked-  12: Hypertension: continue losartan 50 mg daily Vitals:   05/30/22 2200 05/31/22 0500  BP:  135/68  Pulse: 100 100  Resp: 16 16  Temp:  98.7 F (37.1 C)  SpO2: 98% 94%   Elevated , intake 2800 ml po will  13: COPD: continue Yupelri nebulizer daily, Dulera inhaler twice daily 14: Acute kidney injury/CKD stage III: monitor serum BUN and creatinine. Last Cr 1.53  6/26- Cr 1.71 and BUN up  dramatically to 63- will give 500cc IVFs and recheck- might need more Torsemide, but I'm concerned about balancing kidneys and CHF.  15: Leukocytosis: continue Augmentin and monitor CBC, incision, fever  6/21- WBC down to 11k- will con't to monitor clinically  6/23- WBC down to 9k- monitor clinically 16: Anemia: normal red blood cell indices.  Follow-up CBC. Last HGB 9.7 17: Left BKA four years ago; requesting [facility] referral for new shrinker 18: OSA: uses cpap at night with O2 2L, continue 19. Anxiety  6/21- will get Neuropsych involved if need be  20. Insomnia  6/21- will restart home Melatonin 10 mg QHS  6/22- slept great with getting trazodone- will con't 21. Hyperkalemia  6/22- K+ 5.7- will give Lokelma 10g x1 and recheck labs in AM  6/23- K+ 4.7 today- will recheck Monday  6/26- K+ 5.3- will give Lokelma again and recheck in AM 22. AKI  6/22- Cr up to 1.83 from 1.5 and BUN 42 from 32- will need to stop Torsemide and monitor daily weights  6/23- Cr down to 1.67 and BUN up to 47- off Torsemide- will not give unless gets fluid overloaded.   6/26- per #14 as above 23. Constipation  6/26- will give Sorbitol at 3pm if no result by then- has been 4 days. Could be adding to weight gain.  24. Dispo  6/22- pt asking for off floor pass- will d/w PT   6/23- OK for pass   I spent a total of 38   minutes on total care today- >50% coordination of care- due to d/w charge nurse about weights- might need IM consult- will follow more closely due to balance of CHF and AKI on CKD.       LOS: 6 days A FACE TO FACE EVALUATION WAS PERFORMED  Ceasar Decandia 05/31/2022, 8:46 AM

## 2022-05-31 NOTE — Progress Notes (Signed)
Pt states that her PCP just came in the room and told her that she needs to be disconnected from the IV fluids. This nurse told pt that there is no order to discontinue fluids or note from the PCP. Pt continues to refuse continuous NS, saline locked at this time.

## 2022-05-31 NOTE — Progress Notes (Signed)
Patient comfortable on BIPAP at this time.  RT will continue to monitor.

## 2022-05-31 NOTE — Discharge Summary (Signed)
Physician Discharge Summary  Patient ID: [name] MRN: [id number] DOB/AGE: [birth year] 44 y.o.  Admit date: 05/25/2022 Discharge date: 06/04/2022  Discharge Diagnoses:  Principal Problem:   Hypervolemia Active Problems:   Hypertension   Diastolic HF (heart failure) ([name])   Status post transmetatarsal amputation of right foot ([place]) Debility secondary to right transmetatarsal amputation Diastolic heart failure Diabetes mellitus Chronic kidney disease stage III Morbid obesity Obstructive sleep apnea Left BKA status COPD Hypertension Acute kidney injury Leukocytosis Anemia Mood disorder  Discharged Condition: good  Significant Diagnostic Studies:  Narrative & Impression  CLINICAL DATA:  Shortness of breath   EXAM: PORTABLE CHEST 1 VIEW   COMPARISON:  Previous studies including the examination of 05/15/2022   FINDINGS: Transverse diameter of heart is slightly increased. There are no signs of alveolar pulmonary edema or focal pulmonary consolidation. There is no significant pleural effusion or pneumothorax.   IMPRESSION: There are no signs of pulmonary edema or new focal infiltrates.     Electronically Signed   By: Elmer Picker M.D.   On: 05/27/2022 09:22    Labs:  Basic Metabolic Panel: Recent Labs  Lab 05/31/22 0612 06/01/22 0614 06/02/22 0530  NA 134* 138 140  K 5.3* 4.9 4.2  CL 105 103 104  CO2 _0 GLUCOSE 103* 214* 180*  BUN 63* 58* 59*  CREATININE 1.71* 1.37* 1.30*  CALCIUM 8.7* 8.9 8.9    CBC: Recent Labs  Lab 05/31/22 0612  WBC 7.5  NEUTROABS 3.1  HGB 9.5*  HCT 30.6*  MCV 94.7  PLT 343    CBG: Recent Labs  Lab 06/03/22 0547 06/03/22 1305 06/03/22 1650 06/03/22 2101 06/04/22 0736  GLUCAP 195* 227* 212* 234* 187*    Brief HPI:   [name] is a 46 y.o. female with a history of chronic right transmetatarsal foot wound who presented with hyperglycemic hyperosmolar state and worsening appearance of her foot  wound.  MRI was consistent with likely osteomyelitis.  Podiatry and ID consultations were carried out.  Broad-spectrum antibiotics administered.  Started on insulin infusion and IV fluids and dextrose.  She was taken to the operating room on 6/16 and underwent transmetatarsal amputation revision by Dr. Boneta Lucks.  She was continued on a 7-day course of Augmentin orally.   Hospital Course: [name] was admitted to rehab 05/25/2022 for inpatient therapies to consist of PT, ST and OT at least three hours five days a week. Past admission physiatrist, therapy team and rehab RN have worked together to provide customized collaborative inpatient rehab.  On 6/21.  She complained of difficulty breathing at night when supine.  Chest x-ray was performed which was negative for volume overload.  Torsemide 20 mg in the morning restarted.  Follow-up by podiatry on 6/23: Surgical dressing removed, incision clean dry and intact.  Dressing replaced and will plan to leave in place until follow-up as outpatient. Over the course of the next several days, her BUN and creatinine fluctuated and trended upward.  Weight increased 9 kg over the course of 4 days. Complained of some shortness of breath when supine. Family medicine service consulted for dosing of her torsemide, increasing BUN and creatinine and hyperkalemia on 6/26. She was seen by Dr. [name] who stopped losartan due to hyperkalemia and discontinued IVFs. Started torsemide 40 mg BID. BUN/Cr stable 6/28. Reported increased pain due to refusal of afternoon dose of Neurontin. Adjusted to 1200 mg BID on 6/29. Oxycodone 7.5 every six hours as well as Duloxetine and  Cymbalta continued.  Neuropsychiatric consult obtained 6/26. Referral sent to behavioral health for outpatient follow-up.  Blood pressures were monitored on TID basis and remained stable on losartan 50 mg daily. Losartan discontinued due to hyperkalemia on 6/26. Elevation in BP on 6/27 and Norvasc 10 mg  started on 6/27.  Diabetes has been monitored with ac/hs CBG checks and SSI was use prn for tighter BS control. Meal coverage continued with 20 units and Semglee 50 units BID. Increased to 52 units 6/26. Patient eating at cafeteria and advised regarding carb modification and increased CBGs.  06/03/2022     Rehab course: During patient's stay in rehab weekly team conferences were held to monitor patient's progress, set goals and discuss barriers to discharge. At admission, patient required min assist with mobility and SBA- total assist with basic self care skills.  She  has had improvement in activity tolerance, balance, postural control as well as ability to compensate for deficits. She has had improvement in functional use RUE/LUE  and RLE/LLE as well as improvement in awareness. Attention to ADLs and transfers as NWB on RLE and left BKA.   Disposition: Home Discharge disposition: 01-Home or Self Care      Diet: Carb modified  Special Instructions:  No driving, alcohol consumption or tobacco use.   30-35 minutes were spent on discharge planning and discharge summary.  Discharge Instructions     Ambulatory referral to Behavioral Health   Complete by: As directed    Ambulatory referral to Physical Medicine Rehab   Complete by: As directed    Hospital follow-up   Discharge patient   Complete by: As directed    Discharge disposition: 01-Home or Self Care   Discharge patient date: 06/04/2022      Allergies as of 06/04/2022       Reactions   Iodine Other (See Comments)   Kidney dysfunction   Kiwi Extract Shortness Of Breath, Swelling, Anaphylaxis   Morphine And Related Nausea And Vomiting   Nalbuphine Other (See Comments)   Hallucinations   Trental [pentoxifylline] Nausea And Vomiting   Toradol [ketorolac Tromethamine] Other (See Comments)   Feels like something is crawling on me   Morphine Nausea And Vomiting   Nubain [nalbuphine Hcl] Other (See Comments)   "FEELS  LIKE SOMETHING CRAWLING ON ME"        Medication List     STOP taking these medications    amoxicillin-clavulanate 875-125 MG tablet Commonly known as: AUGMENTIN   losartan 50 MG tablet Commonly known as: COZAAR   potassium chloride SA 20 MEQ tablet Commonly known as: Klor-Con M20   ramelteon 8 MG tablet Commonly known as: ROZEREM       TAKE these medications    Accu-Chek Guide test strip Generic drug: glucose blood USE T0 CHECK BLOOD SUGAR 3 TIMES DAILY   Accu-Chek Softclix Lancets lancets Use to check blood glucose four times daily   Accu-Chek FastClix Lancets Misc USE DAILY AS INSTRUCTED   acetaminophen 325 MG tablet Commonly known as: TYLENOL Take 1-2 tablets (325-650 mg total) by mouth every 4 (four) hours as needed for mild pain. What changed:  how much to take when to take this reasons to take this   albuterol 108 (90 Base) MCG/ACT inhaler Commonly known as: VENTOLIN HFA INHALE 1 TO 2 PUFFS BY MOUTH EVERY SIX HOURS AS NEEDED FOR WHEEZING OR SHORTNESS OF BREATH What changed: See the new instructions.   amLODipine 10 MG tablet Commonly known as: NORVASC Take 1 tablet (  10 mg total) by mouth daily.   aspirin EC 81 MG tablet Take 1 tablet (81 mg total) by mouth daily. Swallow whole.   Baqsimi One Pack 3 MG/DOSE Powd Generic drug: Glucagon Place 3 mg into the nose as needed (low blood sugar).   cetirizine 10 MG tablet Commonly known as: ZYRTEC Take 1 tablet (10 mg total) by mouth daily as needed for allergies.   Cholecalciferol 25 MCG (1000 UT) tablet Take 1 tablet (1,000 Units total) by mouth daily.   Dexcom G6 Sensor Misc Inject 1 applicator into the skin as directed. Change sensor every 10 days.   Dexcom G6 Transmitter Misc Inject 1 Device into the skin as directed. Reuse 8 times with sensor changes.   DULoxetine 30 MG capsule Commonly known as: Cymbalta Take 1 capsule (30 mg total) by mouth daily. X 1 week; then 60 mg daily- for knee  and back/nerve pain What changed:  how much to take additional instructions   gabapentin 600 MG tablet Commonly known as: NEURONTIN Take 2 tablets (1,200 mg total) by mouth 2 (two) times daily. What changed:  how much to take when to take this   Glucagon Emergency 1 MG Kit Inject 1 mg into the muscle as needed for low blood sugar.   GNP UltiCare Pen Needles 32G X 4 MM Misc Generic drug: Insulin Pen Needle USE TO inject insulin 2 TIMES DAILY   NovoLOG FlexPen 100 UNIT/ML FlexPen Generic drug: insulin aspart Inject 20 Units into the skin 3 (three) times daily with meals.   nystatin powder Commonly known as: MYCOSTATIN/NYSTOP Apply 1 application. topically 3 (three) times daily.   ondansetron 4 MG tablet Commonly known as: Zofran Take 1 tablet (4 mg total) by mouth every 8 (eight) hours as needed for nausea or vomiting.   oxyCODONE-acetaminophen 7.5-325 MG tablet Commonly known as: PERCOCET Take 1 tablet by mouth every 6 (six) hours as needed for severe pain.   prednisoLONE acetate 1 % ophthalmic suspension Commonly known as: PRED FORTE Place 1 drop into both eyes 4 (four) times daily.   Spiriva Respimat 1.25 MCG/ACT Aers Generic drug: Tiotropium Bromide Monohydrate Inhale 2 puffs into the lungs daily.   Symbicort 160-4.5 MCG/ACT inhaler Generic drug: budesonide-formoterol inhale 2 PUFFS into THE lungs 2 TIMES DAILY What changed:  how to take this when to take this additional instructions   Torsemide 40 MG Tabs Take 40 mg by mouth 2 (two) times daily. What changed:  medication strength when to take this reasons to take this additional instructions   traZODone 50 MG tablet Commonly known as: DESYREL Take 0.5-1 tablets (25-50 mg total) by mouth at bedtime as needed for sleep.   Tyler Aas FlexTouch 200 UNIT/ML FlexTouch Pen Generic drug: insulin degludec Inject 52 Units into the skin 2 (two) times daily. What changed: how much to take   Trulicity 3 ZO/1.0RU  Sopn Generic drug: Dulaglutide Inject 3 mg into the skin once a week. Sundays        Follow-up Information     [name], MD Follow up.   Specialty: Family Medicine Contact information: [place] [address] [phone]         Sherren Mocha, MD .   Specialty: Cardiology Why: Call to make arrangements for hospital follow-up appointment Contact information: [address] [address] [phone]         [name], DPM. Go on 06/09/2022.   Specialty: Podiatry Why: Go to  appointment on 06/09/2022 at 11:30 am. Contact information: [place] [address] [phone]         Courtney Heys, MD Follow up.   Specialty: Physical Medicine and Rehabilitation Why: office will call you to arrange your appt (sent) Contact information: [address] [place] [address] [phone]         Riley Lam, MD Follow up.   Specialty: Endocrinology Why: Call to make arrangements for hospital follow-up appointment Contact information: [place] [address] [phone]         Donato Heinz, MD Follow up.   Specialty: Nephrology Why: Call to make arrangements for hospital follow-up appointment Contact information: [place] [address] [phone]                 Signed: Barbie Banner 06/04/2022, 12:58 PM

## 2022-06-01 ENCOUNTER — Encounter: Payer: Medicaid Other | Admitting: Family Medicine

## 2022-06-01 DIAGNOSIS — E8770 Fluid overload, unspecified: Secondary | ICD-10-CM

## 2022-06-01 LAB — BASIC METABOLIC PANEL
Anion gap: 9 (ref 5–15)
BUN: 58 mg/dL — ABNORMAL HIGH (ref 6–20)
CO2: 26 mmol/L (ref 22–32)
Calcium: 8.9 mg/dL (ref 8.9–10.3)
Chloride: 103 mmol/L (ref 98–111)
Creatinine, Ser: 1.37 mg/dL — ABNORMAL HIGH (ref 0.44–1.00)
GFR, Estimated: 49 mL/min — ABNORMAL LOW (ref >60)
Glucose, Bld: 214 mg/dL — ABNORMAL HIGH (ref 70–99)
Potassium: 4.9 mmol/L (ref 3.5–5.1)
Sodium: 138 mmol/L (ref 135–145)

## 2022-06-01 LAB — GLUCOSE, CAPILLARY
Glucose-Capillary: 210 mg/dL — ABNORMAL HIGH (ref 70–99)
Glucose-Capillary: 216 mg/dL — ABNORMAL HIGH (ref 70–99)
Glucose-Capillary: 178 mg/dL — ABNORMAL HIGH (ref 70–99)
Glucose-Capillary: 219 mg/dL — ABNORMAL HIGH (ref 70–99)

## 2022-06-01 MED ORDER — AMLODIPINE BESYLATE 10 MG PO TABS
10.0000 mg | ORAL_TABLET | Freq: Every day | ORAL | Status: DC
  Administered 2022-06-01 – 2022-06-04 (×4): 10 mg via ORAL
  Filled 2022-06-01 (×4): qty 1

## 2022-06-01 NOTE — Assessment & Plan Note (Addendum)
Consulted by CIR team, as patient with weight gain from 154.6 kg on 6/22 to 163.2 kg on 6/23.  Upon admission to CIR, patient taking torsemide 20 mg as needed, but was scheduled to 20 mg daily.  With daily torsemide, increase in creatinine from 1.53->1.83.  Creatinine decreased to 1.30 this a.m. Although weight increased, patient urinating appropriately, with improvements to breathing, and feels less swollen. - Continue torsemide 40 mg twice daily (home dose 80 mg every morning and 40 mg every afternoon). Consider increasing to home regimen if 40 mg BID no longer sufficient. -Daily weights

## 2022-06-01 NOTE — Discharge Instructions (Addendum)
Inpatient Rehab Discharge Instructions  Orpha Dain Discharge date and time: 06/04/2022   Activities/Precautions/ Functional Status: Activity: no lifting, driving, or strenuous exercise until cleared by MD Diet: diabetic diet Wound Care: keep wound clean and dry; leave dressing place until seen by podiatry Functional status:  ___ No restrictions     ___ Walk up steps independently ___ 24/7 supervision/assistance   ___ Walk up steps with assistance __x_ Intermittent supervision/assistance  ___ Bathe/dress independently ___ Walk with walker     ___ Bathe/dress with assistance ___ Walk Independently    ___ Shower independently ___ Walk with assistance    __x_ Shower with assistance __x_ No alcohol     ___ Return to work/school ________   COMMUNITY REFERRALS UPON DISCHARGE:    Home Health:   PT     OT                     Agency: [facility]  Phone:  [phone] *Please expect follow-up within 2-3 days to schedule your home visit. If you have not received follow-up, be sure to contact the branch directly.*    Medical Equipment/Items Ordered: 30" transfer board                                                 Agency/Supplier: [facility] [id number]  Special Instructions:  Check blood sugars 4 times daily and record. Follow-up with primary care provider in 1-2 weeks.  No driving, alcohol consumption or tobacco use.   My questions have been answered and I understand these instructions. I will adhere to these goals and the provided educational materials after my discharge from the hospital.  Patient/Caregiver Signature _______________________________ Date __________  Clinician Signature _______________________________________ Date __________  Please bring this form and your medication list with you to all your follow-up doctor's appointments.

## 2022-06-01 NOTE — Progress Notes (Signed)
Occupational Therapy Session Note  Patient Details  Name: [name] MRN: [id number] Date of Birth: June 17, 1976  Today's Date: 06/01/2022 OT Individual Time: 1130-1200 OT Individual Time Calculation (min): 30 min    Short Term Goals: Week 2:  OT Short Term Goal 1 (Week 2): STG = LTG due to ELOS  Skilled Therapeutic Interventions/Progress Updates:    OT intervention with focus on DABSC transfers, PWC mobility, and BUE therex. DABSC SB transfer with CGA +2 to stabilize SB. PWC mobility throughout unit with supervision and min verbal cues to keep RLE on foot place. BUE therex with 5# bar tapping medium therapy ball 4x15. Pt returned to room and remained in PWC. All needs within reach.   Therapy Documentation Precautions:  Precautions Precautions: Fall, Other (comment) Precaution Comments: hx of L BKA, prosthesis in room Restrictions Weight Bearing Restrictions: Yes RLE Weight Bearing: Non weight bearing Other Position/Activity Restrictions: LLE prosthetic in room  Pain: Pain Assessment Pain Scale: 0-10 Pain Score: 0-No pain   Therapy/Group: Individual Therapy  Rich Brave 06/01/2022, 12:19 PM

## 2022-06-01 NOTE — Assessment & Plan Note (Addendum)
As well, concerns for hyperkalemia with potassium increased to 5.7, but decreased appropriately with Lokelma.  Losartan discontinued.  Patient with significant improvements to BP, MR 114/65, and hyperkalemia remains resolved at 4.2. - Cont norvasc 10 mg - Cont Torsemide 40 BID

## 2022-06-01 NOTE — Progress Notes (Signed)
Physical Therapy Session Note  Patient Details  Name: [name] MRN: [id number] Date of Birth: 10-01-1976  Today's Date: 06/01/2022 PT Individual Time: 1000-1100; 1300-1400 PT Individual Time Calculation (min): 60 min and 60 min  Short Term Goals: Week 1:  PT Short Term Goal 1 (Week 1): =LTG due to ELOS  Skilled Therapeutic Interventions/Progress Updates:    Session 1: Pt received supine in bed having assist with B&D from nurse tech. This therapist takes over. No complaints of pain this AM. Supine to sit with min A for trunk elevation. Pt is setup A for UB bathing and dressing while seated EOB. Pt requires assist to don LLE prosthetic and pants while seated EOB via lateral leans to pull pants up over hips. Slide board transfer bed to w/c with min A from level surface. Contacted [employer] regarding pt's LLE prosthetic and issues with socket screw coming loose during activity and limb detaching, setup appointment for 6/28 between 1-2 PM. Slide board transfer w/c to/from mat table with CGA with some forearm support needed and steadying of slide board. Pt left seated in PWC at end of session.  Session 2: Pt received seated on BSC in room with nursing present for transfer. Pt able to safely transfer back to [facility] with CGA and forearm support and assist from a 2nd person for steadying of board for safety. Pt's family then arrives for family training session (daughter, aide, and friend/roommate). Demonstrated how to perform safe slide board transfer PWC to/from elevated bed with min A. Each of pt's family members able to perform return demonstration of transfer. Pt able to perform sit to supine with min A needed for LLE management due to unable to doff prosthetic limb 2/2 wearing pants with use of bedrail. Supine to sit with Supervision with use of bedrail. Provided handout for where to purchase bedrail online as well as purchase of bariatric slide board if needed. Pt left seated in PWC in room with needs in  reach, family present at end of session.  Therapy Documentation Precautions:  Precautions Precautions: Fall, Other (comment) Precaution Comments: hx of L BKA, prosthesis in room Restrictions Weight Bearing Restrictions: Yes RLE Weight Bearing: Non weight bearing Other Position/Activity Restrictions: LLE prosthetic in room      Therapy/Group: Individual Therapy   Peter Congo, PT, DPT, CSRS 06/01/2022, 4:12 PM

## 2022-06-01 NOTE — Progress Notes (Signed)
PROGRESS NOTE   Subjective/Complaints:  Took Lokelma 1/2 dose yesterday-  large BM last night/overnight.  Family medicine stopped Losartan- pt's BP is more elevated as result Also got Torsemide 40 mg BID  Pt's CBG's have been going up- pt has been going off floor and eating at cafeteria and not sticking to diet. .    ROS:   Pt denies SOB, abd pain, CP, N/V/C/D, and vision changes   Objective:   No results found. Recent Labs    05/31/22 0612  WBC 7.5  HGB 9.5*  HCT 30.6*  PLT 343   Recent Labs    05/31/22 0612 06/01/22 0614  NA 134* 138  K 5.3* 4.9  CL 105 103  CO2 24 26  GLUCOSE 103* 214*  BUN 63* 58*  CREATININE 1.71* 1.37*  CALCIUM 8.7* 8.9    Intake/Output Summary (Last 24 hours) at 06/01/2022 0823 Last data filed at 05/31/2022 1904 Gross per 24 hour  Intake 1404.38 ml  Output --  Net 1404.38 ml        Physical Exam: Vital Signs Blood pressure (!) 169/68, pulse 94, temperature 97.9 F (36.6 C), temperature source Oral, resp. rate 17, height 5\' 2"  (1.575 m), weight (!) 163.2 kg, SpO2 97 %.      General: awake, alert, but asleep initially; on CPAP; NAD HENT: conjugate gaze; oropharynx moist CV: regular rate; no JVD Pulmonary: CTA B/L; no W/R/R- good air movement GI: soft, NT, ND, (+)BS- BMI 63 Psychiatric: flat Neurological: Ox3  Musculoskeletal:        General: No tenderness.     Cervical back: No tenderness.     Right lower leg: Edema present.     Comments: Right foot surgical dressing dry and intact; on TMA;  left BKA well healed, shrinker in place- shrinker in place  Neurological:     General: No focal deficit present.     Mental Status: She is alert and oriented to person, place, and time.     Sensory: No sensory deficit.     Coordination: Coordination normal.     Comments: Follows commands and answers questions Strength 5/5 in b/l UE Strength 4/5 R hip flexion and knee  extension Strength 5/5 L hip flexion and 4+/5 knee extension    Assessment/Plan: 1. Functional deficits which require 3+ hours per day of interdisciplinary therapy in a comprehensive inpatient rehab setting. Physiatrist is providing close team supervision and 24 hour management of active medical problems listed below. Physiatrist and rehab team continue to assess barriers to discharge/monitor patient progress toward functional and medical goals  Care Tool:  Bathing    Body parts bathed by patient: Right arm, Left arm, Chest, Abdomen, Right upper leg, Left upper leg, Face   Body parts bathed by helper: Front perineal area, Buttocks, Right lower leg Body parts n/a: Left lower leg   Bathing assist Assist Level: Minimal Assistance - Patient > 75%     Upper Body Dressing/Undressing Upper body dressing   What is the patient wearing?: Bra, Pull over shirt    Upper body assist Assist Level: Minimal Assistance - Patient > 75%    Lower Body Dressing/Undressing Lower body dressing  Lower body dressing activity did not occur:  (Left prosthesis and RLE NWB from recent surgery (Right foot bandaged)) What is the patient wearing?: Ace wrap/stump shrinker, Pants     Lower body assist Assist for lower body dressing: Moderate Assistance - Patient 50 - 74%     Toileting Toileting    Toileting assist Assist for toileting: Moderate Assistance - Patient 50 - 74%     Transfers Chair/bed transfer  Transfers assist     Chair/bed transfer assist level: Minimal Assistance - Patient > 75%     Locomotion Ambulation   Ambulation assist   Ambulation activity did not occur: Safety/medical concerns          Walk 10 feet activity   Assist  Walk 10 feet activity did not occur: Safety/medical concerns        Walk 50 feet activity   Assist Walk 50 feet with 2 turns activity did not occur: Safety/medical concerns         Walk 150 feet activity   Assist Walk 150 feet  activity did not occur: Safety/medical concerns         Walk 10 feet on uneven surface  activity   Assist Walk 10 feet on uneven surfaces activity did not occur: Safety/medical concerns         Wheelchair     Assist Is the patient using a wheelchair?: Yes Type of Wheelchair: Power    Wheelchair assist level: Independent Max wheelchair distance: >150 ft    Wheelchair 50 feet with 2 turns activity    Assist        Assist Level: Independent   Wheelchair 150 feet activity     Assist      Assist Level: Independent   Blood pressure (!) 169/68, pulse 94, temperature 97.9 F (36.6 C), temperature source Oral, resp. rate 17, height 5\' 2"  (1.575 m), weight (!) 163.2 kg, SpO2 97 %.  Medical Problem List and Plan: 1. 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  Antithrombotics: -DVT/anticoagulation:  Pharmaceutical: Lovenox 80 mg daily             -antiplatelet therapy: Aspirin 81 mg daily 3. Pain Management: Tylenol as needed --Neurontin 900 mg 3 times daily, Duloxetine 60mg  daily             --Oxycodone 7.5/325 every 6 hours as needed  6/26- pain doing better- con't regimen 4. Mood/Sleep: LCSW to evaluate and provide emotional support             -Bipolar disorder: Continue Cymbalta DR 60 mg daily             -antipsychotic agents: n/a 5. Neuropsych/cognition: This patient is capable of making decisions on her own behalf. 6. Skin/Wound Care: Skin care checks             --monitor right foot surgical incision             --continue Augmentin through Friday  6/23 7.  Fluids/Electrolytes/Nutrition: Routine I's and O's and follow-up chemistries 8: Gout, acute flare right knee: improving, Continue colchicine 0.3 mg daily 9: Revison of right first ray amputation 1/16>>NWB             --follow-up after discharge with podiatry, Dr. Allena Katz             -Continue Augmentin until 6/23             --dressing changes as per podiatry 10: DM-2: continue sliding scale insulin with meals and at bedtime             --continue meal coverage NovoLog 20 units             --continue Semglee 50 units twice daily             --follow-up endocrinology/Dr. [facility]  CBG (last 3)  Recent Labs    05/31/22 1659 05/31/22 2050 06/01/22 0606  GLUCAP 303* 347* 210*  Elevated am CBG increase semglee  6/26- Pt's insulin just increased- CBGs 82-150s- but 1 value 267- will monitor trend.   6/27- educated pt today her CBG's are going into 300s again, because she's eating at cafeteria- advised her to stick to her DM diet- because increasing insulin so she can eat more is not the goal  11: Diastolic heart failure: stable, monitor symptoms.  Daily weight.             -She takes home torsemide, it was held due to AKI.  Consider restarted when appropriate   6/21- will restart Torsemide 20 mg in AM and pm and monitor BMP/weight- might need CXR tomorrow  6/22- will stop Torsemide since Cr bumped a lot to 1.83- and BUN up to 41 from 32- did lose ~ 4 lbs overnight with peeing. Wil get CXR to f/u on fluid status.   6/23- CXR looks good- will not keep on Torsemide, however if has any fluid overload, can give 1x dose of Torsemide 20 mg.   Orthopnea sx after hi intake of fluids yesterday will give torsemide 20mg  this am may need to repeat tonight if pt still has orthopnea    6/26- weight up 10 kg- which doesn't make sense, although possible- will ask for weight to be rechecked-   6/27- weight was 162 kg- so given Torsemide 40 mg BID by family medicine service who was consulted-  12:  Hypertension: continue losartan 50 mg daily Vitals:   05/31/22 2030 06/01/22 0247  BP:  (!) 169/68  Pulse: (!) 101 94  Resp: 16 17  Temp:  97.9 F (36.6 C)  SpO2: 93% 97%   Elevated , intake 2800 ml po will   6/27- Losartan held due to Hyperkalemia- will ask for Family medicine address what to replace Losartan with 13: COPD: continue Yupelri nebulizer daily, Dulera inhaler twice daily 14: Acute kidney injury/CKD stage III: monitor serum BUN and creatinine. Last Cr 1.53  6/26- Cr 1.71 and BUN up dramatically to 63- will give 500cc IVFs and recheck- might need more Torsemide, but I'm concerned about balancing kidneys and CHF.   6/27- Given Torsemide 40 mg BID- Cr down to 1.37 and BUN 58- per Family Medicine 15: Leukocytosis: continue Augmentin and monitor CBC, incision, fever  6/21- WBC down to 11k- will con't to monitor clinically  6/23- WBC down to 9k- monitor clinically 16: Anemia: normal red blood cell indices.  Follow-up CBC. Last HGB 9.7 17: Left BKA four years ago; requesting [facility] referral  for new shrinker 18: OSA: uses cpap at night with O2 2L, continue 19. Anxiety  6/21- will get Neuropsych involved if need be  20. Insomnia  6/21- will restart home Melatonin 10 mg QHS  6/22- slept great with getting trazodone- will con't 21. Hyperkalemia  6/22- K+ 5.7- will give Lokelma 10g x1 and recheck labs in AM  6/23- K+ 4.7 today- will recheck Monday  6/26- K+ 5.3- will give Lokelma again and recheck in AM  6/27- K+ 4.9- did take half dose of Lokelma 22. AKI  6/22- Cr up to 1.83 from 1.5 and BUN 42 from 32- will need to stop Torsemide and monitor daily weights  6/23- Cr down to 1.67 and BUN up to 47- off Torsemide- will not give unless gets fluid overloaded.   6/26- per #14 as above 23. Constipation  6/26- will give Sorbitol at 3pm if no result by then- has been 4 days. Could be adding to weight gain.  24. Dispo  6/22- pt asking for off floor pass- will d/w PT   6/23- OK for  pass  6/27- educated pt can leave floor, but needs to stick to DM diet.   I spent a total of  39  minutes on total care today- >50% coordination of care- due to Team conference and education on elevated CBG's.      LOS: 7 days A FACE TO FACE EVALUATION WAS PERFORMED  Armandina Iman 06/01/2022, 8:23 AM

## 2022-06-02 ENCOUNTER — Ambulatory Visit: Payer: Medicaid Other | Admitting: Podiatry

## 2022-06-02 ENCOUNTER — Ambulatory Visit: Payer: Medicaid Other | Admitting: Physical Medicine and Rehabilitation

## 2022-06-02 DIAGNOSIS — E8770 Fluid overload, unspecified: Secondary | ICD-10-CM

## 2022-06-02 LAB — BASIC METABOLIC PANEL
Anion gap: 11 (ref 5–15)
BUN: 59 mg/dL — ABNORMAL HIGH (ref 6–20)
CO2: 25 mmol/L (ref 22–32)
Calcium: 8.9 mg/dL (ref 8.9–10.3)
Chloride: 104 mmol/L (ref 98–111)
Creatinine, Ser: 1.3 mg/dL — ABNORMAL HIGH (ref 0.44–1.00)
GFR, Estimated: 52 mL/min — ABNORMAL LOW (ref >60)
Glucose, Bld: 180 mg/dL — ABNORMAL HIGH (ref 70–99)
Potassium: 4.2 mmol/L (ref 3.5–5.1)
Sodium: 140 mmol/L (ref 135–145)

## 2022-06-02 LAB — GLUCOSE, CAPILLARY
Glucose-Capillary: 165 mg/dL — ABNORMAL HIGH (ref 70–99)
Glucose-Capillary: 188 mg/dL — ABNORMAL HIGH (ref 70–99)
Glucose-Capillary: 206 mg/dL — ABNORMAL HIGH (ref 70–99)
Glucose-Capillary: 193 mg/dL — ABNORMAL HIGH (ref 70–99)

## 2022-06-02 NOTE — Progress Notes (Incomplete)
Occupational Therapy Session Note  Patient Details  Name: Marcine Gadway MRN: [id number] Date of Birth: October 02, 1976  Today's Date: 06/02/2022 OT Individual Time: 1300-1340 OT Individual Time Calculation (min): 40 min    Short Term Goals: Week 2:  OT Short Term Goal 1 (Week 2): STG = LTG due to ELOS  Skilled Therapeutic Interventions/Progress Updates:      Therapy Documentation Precautions:  Precautions Precautions: Fall, Other (comment) Precaution Comments: hx of L BKA, prosthesis in room Restrictions Weight Bearing Restrictions: Yes RLE Weight Bearing: Non weight bearing Other Position/Activity Restrictions: LLE prosthetic in room General:   Vital Signs: Therapy Vitals Temp: 98 F (36.7 C) Pulse Rate: (!) 103 Resp: 17 BP: (!) 149/93 Patient Position (if appropriate): Sitting Oxygen Therapy SpO2: 100 % O2 Device: Room Air Pain:   ADL: ADL Eating: Independent Where Assessed-Eating: Edge of bed Grooming: Setup Where Assessed-Grooming: Edge of bed, Chair Upper Body Bathing: Setup Where Assessed-Upper Body Bathing: Edge of bed Lower Body Bathing: Maximal assistance Where Assessed-Lower Body Bathing: Edge of bed, Bed level Upper Body Dressing: Minimal assistance Where Assessed-Upper Body Dressing: Edge of bed Lower Body Dressing: Maximal assistance Where Assessed-Lower Body Dressing: Edge of bed, Bed level Toileting: Maximal assistance Where Assessed-Toileting: Bed level Toilet Transfer: Maximal assistance Toilet Transfer Method: Editor, commissioning Transfer: Not assessed, Unable to assess Tub/Shower Transfer Method: Unable to assess [employer] Transfer: Not assessed, Unable to assess [employer] Method: Unable to assess Vision   Perception    Praxis   Balance   Exercises:   Other Treatments:     Therapy/Group: Individual Therapy  Leroy Libman 06/02/2022, 1:44 PM

## 2022-06-02 NOTE — Progress Notes (Signed)
Physical Therapy Session Note  Patient Details  Name: [name] MRN: [id number] Date of Birth: [birth year]  Today's Date: 06/02/2022 PT Individual Time: 8657-8469 PT Individual Time Calculation (min): 40 min   Short Term Goals: Week 1:  PT Short Term Goal 1 (Week 1): =LTG due to ELOS  Skilled Therapeutic Interventions/Progress Updates:    Pt received seated EOB in room, agreeable to PT session. Pt reports increased soreness in her RLE this date, not rated and declines intervention. Discussed importance of adhering to NWB with RLE during transfers to allow proper healing. Pt does not have her LLE prosthetic this PM as it is being fixed by [facility], deferred any OOB mobility or transfers 2/2 not having prosthetic limb available. Session focus on BLE strengthening in seated and supine positions. Seated BLE marches, LAQ, HS curls 2 x 10 reps each. Sit to/from supine at Supervision level on flat bed with use of bedrail. Supine BLE therex: SLR, hip abd, HS curls (RLE); sidelying hip abd, clams, hip ext x 10 reps each. Pt left in L sidelying in bed with CPAP machine in place to take a nap, needs in reach and bed alarm in place.  Therapy Documentation Precautions:  Precautions Precautions: Fall, Other (comment) Precaution Comments: hx of L BKA, prosthesis in room Restrictions Weight Bearing Restrictions: Yes RLE Weight Bearing: Non weight bearing Other Position/Activity Restrictions: LLE prosthetic in room       Therapy/Group: Individual Therapy   Excell Seltzer, PT, DPT, CSRS 06/02/2022, 3:57 PM

## 2022-06-02 NOTE — Progress Notes (Signed)
Physical Therapy Session Note  Patient Details  Name: [name] MRN: [id number] Date of Birth: 1976-08-21  Today's Date: 06/02/2022 PT Individual Time: 0805-0900 PT Individual Time Calculation (min): 55 min   Short Term Goals: Week 1:  PT Short Term Goal 1 (Week 1): =LTG due to ELOS  Skilled Therapeutic Interventions/Progress Updates: Pt presented in bed sleeping but easily aroused and agreeable to therapy. Pt states increased pain this am in L foot. Pt requesting bed pan prior to dressing as this is her normal MO due to not having prothesis on yet. Pt set up with bed pan total A and performed rolling mod I for urinary void. PTA provided total A for peri-care. Pt transferred to sitting mod I and was minA to don bra and set up for donning shirt. PTA donned sleeve and prothesis total A. PTA threaded pants total A and pt performed rolling L/R mod to allow PTA to pull pants over hips. PTA then set up Slide board and pt performed Slide board transfer to [facility] with CGA only due to pt using PTA's forearm for leverage initially while transferring. In [facility] MD arrived for assessment then nsg present to provide pain and am meds. Pt then drove to rehab gym and participated in UE therex including ball taps with 4lb dowel 2 x 15, bicep curls 2 x 15, scapular pinches 2 x 15 and AAROM of L shoulder x 10. Pt drove self back to room once completed with needs met.      Therapy Documentation Precautions:  Precautions Precautions: Fall, Other (comment) Precaution Comments: hx of L BKA, prosthesis in room Restrictions Weight Bearing Restrictions: Yes RLE Weight Bearing: Non weight bearing Other Position/Activity Restrictions: LLE prosthetic in room General:   Vital Signs: Therapy Vitals Temp: 98 F (36.7 C) Pulse Rate: (!) 103 Resp: 17 BP: (!) 149/93 Patient Position (if appropriate): Sitting Oxygen Therapy SpO2: 100 % O2 Device: Room Air Pain:   Mobility:   Locomotion :    Trunk/Postural  Assessment :    Balance:   Exercises:   Other Treatments:      Therapy/Group: Individual Therapy  Hershel Corkery 06/02/2022, 4:06 PM

## 2022-06-02 NOTE — Progress Notes (Signed)
Patient ID: [name], female   DOB: 01-22-1976, 46 y.o.   MRN: [id number]  SW met with pt in room to discuss H[facility] preference. Prefers [facility].   SW sent HHPT/OT referral to [facility] and waiting on follow-up.   Loralee Pacas, MSW, [place] Office: [phone] Cell: [phone] Fax: [phone]

## 2022-06-02 NOTE — Progress Notes (Signed)
Occupational Therapy Session Note  Patient Details  Name: [name] MRN: [id number] Date of Birth: 01/14/1976  Today's Date: 06/02/2022 OT Individual Time: 1300-1340 OT Individual Time Calculation (min): 40 min    Short Term Goals: Week 1:  OT Short Term Goal 1 (Week 1): STG = LTG due to ELOS OT Short Term Goal 1 - Progress (Week 1): Progressing toward goal  Skilled Therapeutic Interventions/Progress Updates:    Pt resting in [place] upon arrival. OT intervention with focus on PWC mobility in community setting, SB transfer, and ongoing discharge planning to prepare for discharge 6/30. Pt requires min verbal cues to place RLE on foot rest of PWC when in motion. All PWC mobility with supervision. Continued education of SB tranfsers and SB placement. Discussed importance of communication with care giver during transfers. SB transfer PWC>EOB with supervision after board placement. Pt remained seated EOB. Bed alarm activated. All needs within reach.   Therapy Documentation Precautions:  Precautions Precautions: Fall, Other (comment) Precaution Comments: hx of L BKA, prosthesis in room Restrictions Weight Bearing Restrictions: Yes RLE Weight Bearing: Non weight bearing Other Position/Activity Restrictions: LLE prosthetic in room Pain:  Pt denies pain this afternoon   Therapy/Group: Individual Therapy  Leroy Libman 06/02/2022, 2:42 PM

## 2022-06-02 NOTE — Progress Notes (Signed)
PROGRESS NOTE   Subjective/Complaints:  Pt reports LBM yesterday.  [facility] coming today since prosthesis isn't staying on right.     ROS:  Pt denies SOB, abd pain, CP, N/V/C/D, and vision changes   Objective:   No results found. Recent Labs    05/31/22 0612  WBC 7.5  HGB 9.5*  HCT 30.6*  PLT 343   Recent Labs    06/01/22 0614 06/02/22 0530  NA 138 140  K 4.9 4.2  CL 103 104  CO2 26 25  GLUCOSE 214* 180*  BUN 58* 59*  CREATININE 1.37* 1.30*  CALCIUM 8.9 8.9    Intake/Output Summary (Last 24 hours) at 06/02/2022 0749 Last data filed at 06/01/2022 2002 Gross per 24 hour  Intake 480 ml  Output --  Net 480 ml        Physical Exam: Vital Signs Blood pressure 114/65, pulse 91, temperature 97.9 F (36.6 C), temperature source Oral, resp. rate 16, height '5\' 2"'$  (1.575 m), weight (!) 165.7 kg, SpO2 98 %.       General: awake, alert, appropriate, sitting at EOB, finished 100% tray, NAD HENT: conjugate gaze; oropharynx moist CV: regular rate; no JVD Pulmonary: CTA B/L; no W/R/R- good air movement- decreased at bases GI: soft, NT, ND, (+)BS Psychiatric: flat, irritable arms crossed on chest Neurological: Ox3  Musculoskeletal:        General: No tenderness.     Cervical back: No tenderness.     Right lower leg: Edema present.     Comments: Right foot surgical dressing dry and intact; on TMA;  left BKA well healed, shrinker in place- shrinker in place  Neurological:     General: No focal deficit present.     Mental Status: She is alert and oriented to person, place, and time.     Sensory: No sensory deficit.     Coordination: Coordination normal.     Comments: Follows commands and answers questions Strength 5/5 in b/l UE Strength 4/5 R hip flexion and knee extension Strength 5/5 L hip flexion and 4+/5 knee extension    Assessment/Plan: 1. Functional deficits which require 3+ hours per day of  interdisciplinary therapy in a comprehensive inpatient rehab setting. Physiatrist is providing close team supervision and 24 hour management of active medical problems listed below. Physiatrist and rehab team continue to assess barriers to discharge/monitor patient progress toward functional and medical goals  Care Tool:  Bathing    Body parts bathed by patient: Right arm, Left arm, Chest, Abdomen, Right upper leg, Left upper leg, Face   Body parts bathed by helper: Front perineal area, Buttocks, Right lower leg Body parts n/a: Left lower leg   Bathing assist Assist Level: Minimal Assistance - Patient > 75%     Upper Body Dressing/Undressing Upper body dressing   What is the patient wearing?: Bra, Pull over shirt    Upper body assist Assist Level: Minimal Assistance - Patient > 75%    Lower Body Dressing/Undressing Lower body dressing    Lower body dressing activity did not occur:  (Left prosthesis and RLE NWB from recent surgery (Right foot bandaged)) What is the patient wearing?: Ace wrap/stump shrinker,  Pants     Lower body assist Assist for lower body dressing: Moderate Assistance - Patient 50 - 74%     Toileting Toileting    Toileting assist Assist for toileting: Moderate Assistance - Patient 50 - 74%     Transfers Chair/bed transfer  Transfers assist     Chair/bed transfer assist level: Minimal Assistance - Patient > 75%     Locomotion Ambulation   Ambulation assist   Ambulation activity did not occur: Safety/medical concerns          Walk 10 feet activity   Assist  Walk 10 feet activity did not occur: Safety/medical concerns        Walk 50 feet activity   Assist Walk 50 feet with 2 turns activity did not occur: Safety/medical concerns         Walk 150 feet activity   Assist Walk 150 feet activity did not occur: Safety/medical concerns         Walk 10 feet on uneven surface  activity   Assist Walk 10 feet on uneven  surfaces activity did not occur: Safety/medical concerns         Wheelchair     Assist Is the patient using a wheelchair?: Yes Type of Wheelchair: Power    Wheelchair assist level: Independent Max wheelchair distance: >150 ft    Wheelchair 50 feet with 2 turns activity    Assist        Assist Level: Independent   Wheelchair 150 feet activity     Assist      Assist Level: Independent   Blood pressure 114/65, pulse 91, temperature 97.9 F (36.6 C), temperature source Oral, resp. rate 16, height '5\' 2"'$  (1.575 m), weight (!) 165.7 kg, SpO2 98 %.  Medical Problem List and Plan: 1. 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  Antithrombotics: -DVT/anticoagulation:  Pharmaceutical: Lovenox 80 mg daily             -antiplatelet therapy: Aspirin 81 mg daily 3. Pain Management: Tylenol as needed --Neurontin 900 mg 3 times daily, Duloxetine '60mg'$  daily             --Oxycodone 7.5/325 every 6 hours as needed  6/26- pain doing better- con't regimen 4. Mood/Sleep: LCSW to evaluate and provide emotional support             -Bipolar disorder: Continue Cymbalta DR 60 mg daily             -antipsychotic agents: n/a 5. Neuropsych/cognition: This patient is capable of making decisions on her own behalf. 6. Skin/Wound Care: Skin care checks             --monitor right foot surgical incision             --continue Augmentin through Friday 6/23 7. Fluids/Electrolytes/Nutrition: Routine I's and O's and follow-up chemistries 8: Gout, acute flare right knee: improving,  Continue colchicine  0.3 mg daily 9: Revison of right first ray amputation 1/16>>NWB             --follow-up after discharge with podiatry, Dr. Posey Pronto             -Continue Augmentin until 6/23             --dressing changes as per podiatry 10: DM-2: continue sliding scale insulin with meals and at bedtime             --continue meal coverage NovoLog 20 units             --continue Semglee 50 units twice daily             --follow-up endocrinology/Dr. Montour  CBG (last 3)  Recent Labs    06/01/22 1647 06/01/22 2054 06/02/22 0642  GLUCAP 178* 219* 165*  Elevated am CBG increase semglee  6/26- Pt's insulin just increased- CBGs 82-150s- but 1 value 267- will monitor trend.   6/27- educated pt today her CBG's are going into 300s again, because she's eating at cafeteria- advised her to stick to her DM diet- because increasing insulin so she can eat more is not the goal   6/28- pt educated to not go off floor to get food, unless sticks to DM diet- CBG's better- con't regimen 11: Diastolic heart failure: stable, monitor symptoms.  Daily weight.             -She takes home torsemide, it was held due to AKI.  Consider restarted when appropriate   6/21- will restart Torsemide 20 mg in AM and pm and monitor BMP/weight- might need CXR tomorrow  6/22- will stop Torsemide since Cr bumped a lot to 1.83- and BUN up to 41 from 32- did lose ~ 4 lbs overnight with peeing. Wil get CXR to f/u on fluid status.   6/23- CXR looks good- will not keep on Torsemide, however if has any fluid overload, can give 1x dose of Torsemide 20 mg.   Orthopnea sx after hi intake of fluids yesterday will give torsemide '20mg'$  this am may need to repeat tonight if pt still has orthopnea    6/26- weight up 10 kg- which doesn't make sense, although possible- will ask for weight to be rechecked-   6/27- weight was 162 kg- so given Torsemide 40 mg BID by family medicine service who was consulted-   6/28- Weight 165 kg  today- in spite of Torsemide-  12: Hypertension: continue losartan 50 mg daily Vitals:   06/01/22 2353 06/02/22 0410  BP:  114/65  Pulse: (!) 102 91  Resp: 18 16  Temp:  97.9 F (36.6 C)  SpO2: 95% 98%   Elevated , intake 2800 ml po will   6/27- Losartan held due to Hyperkalemia- will ask for Family medicine address what to replace Losartan with  6/27- adding back home med of Norvasc -BP looks good.  13: COPD: continue Yupelri nebulizer daily, Dulera inhaler twice daily 14: Acute kidney injury/CKD stage III: monitor serum BUN and creatinine. Last Cr 1.53  6/26- Cr 1.71 and BUN up dramatically to 63- will give 500cc IVFs and recheck- might need more Torsemide, but I'm concerned about balancing kidneys and CHF.   6/27- Given Torsemide 40 mg BID- Cr down to 1.37 and BUN 58- per Family Medicine  6/28- Cr/BUN stable on Torsemide- will con't 15: Leukocytosis: continue Augmentin and monitor CBC, incision, fever  6/21- WBC down to 11k- will con't to monitor  clinically  6/23- WBC down to 9k- monitor clinically 16: Anemia: normal red blood cell indices.  Follow-up CBC. Last HGB 9.7 17: Left BKA four years ago; requesting [facility] referral for new shrinker  6/28- getting [facility] in to fix fitting of prosthesis 18: OSA: uses cpap at night with O2 2L, continue 19. Anxiety  6/21- will get Neuropsych involved if need be  20. Insomnia  6/21- will restart home Melatonin 10 mg QHS  6/22- slept great with getting trazodone- will con't 21. Hyperkalemia  6/22- K+ 5.7- will give Lokelma 10g x1 and recheck labs in AM  6/23- K+ 4.7 today- will recheck Monday  6/26- K+ 5.3- will give Lokelma again and recheck in AM  6/27- K+ 4.9- did take half dose of Lokelma  6/28- K+ 4.2- con't off Losartan 22. AKI  6/22- Cr up to 1.83 from 1.5 and BUN 42 from 32- will need to stop Torsemide and monitor daily weights  6/23- Cr down to 1.67 and BUN up to 47- off Torsemide- will not give unless gets fluid overloaded.    6/26- per #14 as above 23. Constipation  6/26- will give Sorbitol at 3pm if no result by then- has been 4 days. Could be adding to weight gain.  24. Dispo  6/22- pt asking for off floor pass- will d/w PT   6/23- OK for pass  6/27- educated pt can leave floor, but needs to stick to DM diet.     LOS: 8 days A FACE TO FACE EVALUATION WAS PERFORMED  Lewin Pellow 06/02/2022, 7:49 AM

## 2022-06-02 NOTE — Consult Note (Signed)
     Daily Progress/Consult Note Intern Pager: [phone]  Patient name: [name] Medical record number: [id number] Date of birth: May 18, 1976 Age: 46 y.o. Gender: female  Consulting provider: Risa Grill, PA-C Primary Care Provider: [name], MD  Reason for consult: Adjustment of torsemide in light of hypervolemia, hyperkalemia, and elevated creatinine  Assessment and Plan:  [name] is a 46 year old female recently discharged to CIR from [facility]. Pertinent PMH includes uncontrolled T2DM, L AKA, CKD Stage III, OSA on BiPAP, bipolar disorder, and COPD.  * Hypervolemia Consulted by CIR team, as patient with weight gain from 154.6 kg on 6/22 to 163.2 kg on 6/23.  Upon admission to CIR, patient taking torsemide 20 mg as needed, but was scheduled to 20 mg daily.  With daily torsemide, increase in creatinine from 1.53->1.83.  Creatinine decreased to 1.30 this a.m. Although weight increased, patient urinating appropriately, with improvements to breathing, and feels less swollen. - Continue torsemide 40 mg twice daily (home dose 80 mg every morning and 40 mg every afternoon). Consider increasing to home regimen if 40 mg BID no longer sufficient. -Daily weights  Hypertension As well, concerns for hyperkalemia with potassium increased to 5.7, but decreased appropriately with Lokelma.  Losartan discontinued.  Patient with significant improvements to BP, MR 114/65, and hyperkalemia remains resolved at 4.2. - Cont norvasc 10 mg - Cont Torsemide 40 BID     FEN/GI: Per primary PPx: Per primary  Subjective:  Patient seen up in wheelchair this morning, reports feeling better in terms of breathing and swelling.  No acute concerns or complaints reported.  Objective: Temp:  [97.9 F (36.6 C)-98.5 F (36.9 C)] 97.9 F (36.6 C) (06/28 0410) Pulse Rate:  [91-110] 91 (06/28 0410) Resp:  [16-20] 16 (06/28 0410) BP: (114-177)/(65-97) 114/65 (06/28 0410) SpO2:  [92 %-98 %] 98 % (06/28  0410) Weight:  [365 lb 4.8 oz (165.7 kg)] 365 lb 4.8 oz (165.7 kg) (06/28 0500) Physical Exam: General: Morbidly obese female patient sitting in wheelchair, in NAD Cardiovascular: Pitting edema of RLE, but improvements.  Still without edema of LLE. Respiratory: Normal WOB on room air   Laboratory: Most recent CBC Lab Results  Component Value Date   WBC 7.5 05/31/2022   HGB 9.5 (L) 05/31/2022   HCT 30.6 (L) 05/31/2022   MCV 94.7 05/31/2022   PLT 343 05/31/2022   Most recent BMP    Latest Ref Rng & Units 06/02/2022    5:30 AM  BMP  Glucose 70 - 99 mg/dL 180   BUN 6 - 20 mg/dL 59   Creatinine 0.44 - 1.00 mg/dL 1.30   Sodium 135 - 145 mmol/L 140   Potassium 3.5 - 5.1 mmol/L 4.2   Chloride 98 - 111 mmol/L 104   CO2 22 - 32 mmol/L 25   Calcium 8.9 - 10.3 mg/dL 8.9     Patient is determined to be medically stable at this time. [facility] will sign off. Please do not hesitate to call back if questions arise. Thank you for this consult.    Rosezetta Schlatter, MD 06/02/2022, 7:25 AM  PGY-1, [facility] Intern pager: [phone], text pages welcome Secure chat group [place]

## 2022-06-02 NOTE — Progress Notes (Signed)
Pt says she is able to place the unit on herself. Will call if assistance is needed.

## 2022-06-02 NOTE — Progress Notes (Signed)
Occupational Therapy Session Note  Patient Details  Name: [name] MRN: [id number] Date of Birth: [birth year]  Today's Date: 06/02/2022 OT Individual Time: 5726-2035 OT Individual Time Calculation (min): 35 min  Missed OT minutes: 10 min Due to team conference going over time.  Short Term Goals: Week 2:  OT Short Term Goal 1 (Week 2): STG = LTG due to ELOS  Skilled Therapeutic Interventions/Progress Updates:    S: Patient agreeable to participate in OT session. Reports pain in her right foot; surgical pain; no number provided.Pt states that she received her pain medication this morning and can only get it every 6 hours.    Patient participated in skilled OT session focusing on ADL re-training during toileting and functional transfers to and from powered w/c to [facility] using SB with Dycem. Patient demonstrated transfer technique with therapist providing education on technique, form, and safety. Patient requires set-up of SB prior to transfer and Dycem was placed under SB to help stabilize and prevent slipping on plastic. Pt used therapist's arm to stabilize self during transfer. Pt was able to complete transfer with Min A and less time was needed to perform versus yesterday's afternoon session. Therapist provided VC as needed for additional form and/or technique. Pt performed lateral leans several times to allow therapist to pull pants down over hips. Hygiene was performed in same manner with patient leaning on right side. Used same technique (lateral lean right/left) to pull pants back over hips.Therapist facilitated transfer in same manner from [facility] back to powered w/c in order to promote increased safety and  help pt prepare for discharge home this week. To increase functional performance during transfers, patient completed UB strength and endurance activity using UBE bike; 5' forward, 5' reverse. Therapist provided Squeeze Heat Pack (2 taped together) at end of session to apply to left shoulder  for pain management. Pt was educated on use of heat to help decrease pain and soreness sustained from increased use. Pt verbalized understanding.     Therapy Documentation Precautions:  Precautions Precautions: Fall, Other (comment) Precaution Comments: hx of L BKA, prosthesis in room Restrictions Weight Bearing Restrictions: Yes RLE Weight Bearing: Non weight bearing Other Position/Activity Restrictions: LLE prosthetic in room   Therapy/Group: Individual Therapy  Ailene Ravel, OTR/L,CBIS  Supplemental OT - [place] and [facility]  06/02/2022, 7:55 AM

## 2022-06-03 DIAGNOSIS — I10 Essential (primary) hypertension: Secondary | ICD-10-CM

## 2022-06-03 DIAGNOSIS — I5030 Unspecified diastolic (congestive) heart failure: Secondary | ICD-10-CM

## 2022-06-03 LAB — GLUCOSE, CAPILLARY
Glucose-Capillary: 195 mg/dL — ABNORMAL HIGH (ref 70–99)
Glucose-Capillary: 227 mg/dL — ABNORMAL HIGH (ref 70–99)
Glucose-Capillary: 212 mg/dL — ABNORMAL HIGH (ref 70–99)
Glucose-Capillary: 234 mg/dL — ABNORMAL HIGH (ref 70–99)

## 2022-06-03 MED ORDER — GABAPENTIN 600 MG PO TABS
1200.0000 mg | ORAL_TABLET | Freq: Two times a day (BID) | ORAL | Status: DC
  Administered 2022-06-03 – 2022-06-04 (×3): 1200 mg via ORAL
  Filled 2022-06-03 (×3): qty 2

## 2022-06-03 MED ORDER — GABAPENTIN 600 MG PO TABS: 1200.0000 mg | ORAL_TABLET | Freq: Two times a day (BID) | ORAL | Status: DC

## 2022-06-03 MED ORDER — GABAPENTIN 300 MG PO CAPS
900.0000 mg | ORAL_CAPSULE | Freq: Three times a day (TID) | ORAL | Status: DC
  Filled 2022-06-03: qty 3

## 2022-06-03 NOTE — Progress Notes (Signed)
Physical Therapy Discharge Summary  Patient Details  Name: [name] MRN: [id number] Date of Birth: Jan 28, 1976  Today's Date: 06/03/2022 PT Individual Time: 4196-2229 PT Individual Time Calculation (min): 60 min   Patient has met 5 of 7 long term goals due to improved activity tolerance, improved balance, improved postural control, increased strength, and ability to compensate for deficits.  Patient to discharge at a wheelchair level [place].   Patient's care partner is independent to provide the necessary physical assistance at discharge. Pt's aide, daughter, and roommate/friend have completed hands-on family education and are safe to assist pt upon d/c home.  Reasons goals not met: Pt did not meet sit to stand goal as it was not safe to attempt during her rehab stay. She did not meet dynamic sitting goal of mod I as she still requires Supervision for safety. Pt has met all goals adequately for a safe d/c home.  Recommendation:  Patient will benefit from ongoing skilled PT services in home health setting to continue to advance safe functional mobility, address ongoing impairments in endurance, strength, safety, balance, independence with functional mobility, and minimize fall risk.  Equipment: 30" slide board  Reasons for discharge: treatment goals met and discharge from hospital  Patient/family agrees with progress made and goals achieved: Yes  Skilled Intervention: Pt received seated in Fate in room, agreeable to PT session. No complaints of pain. Pt able to perform PWC mobility at mod I level 150 ft (+). Slide board transfer to/from uphill mat to simulate home setup with CGA and some forearm support from therapist during transfer. Discussed how to safely perform a car transfer but not recommending at this time due to height of vehicle pt would have to transfer into [facility]). Recommended pt work towards this transfer with [place]. Sitting balance/endurance while playing Wii bowling at mod I  level. Provided HEP for patient, see link below. Slide board transfer to/from [facility] with min A. Pt is max A for clothing management via lateral leans, total A for pericare. Pt returned to [facility] and left seated in chair with needs in reach. Pt missed 15 min of scheduled therapy session due to meeting therapy goals and no further PT needs at this level of care.  Access Code: [email or web address] URL: [email or web address] Date: 06/03/2022 Prepared by: Excell Seltzer  Exercises - Supine Active Straight Leg Raise  - 1 x daily - 7 x weekly - 2 sets - 10 reps - Supine Heel Slide  - 1 x daily - 7 x weekly - 2 sets - 10 reps - Supine Hip Abduction  - 1 x daily - 7 x weekly - 2 sets - 10 reps - Clamshell  - 1 x daily - 7 x weekly - 2 sets - 10 reps - Sidelying Hip Abduction  - 1 x daily - 7 x weekly - 2 sets - 10 reps - Seated March  - 1 x daily - 7 x weekly - 2 sets - 10 reps - Seated Knee Flexion  - 1 x daily - 7 x weekly - 2 sets - 10 reps - Seated Long Arc Quad  - 1 x daily - 7 x weekly - 2 sets - 10 reps  PT Discharge Precautions/Restrictions Precautions Precautions: Fall;Other (comment) Precaution Comments: hx of L BKA, prosthesis in room Restrictions Weight Bearing Restrictions: Yes RLE Weight Bearing: Non weight bearing Other Position/Activity Restrictions: LLE prosthetic in room Pain Interference Pain Interference Pain Effect on Sleep: 1. Rarely or not at all Pain Interference with  Therapy Activities: 1. Rarely or not at all Pain Interference with Day-to-Day Activities: 1. Rarely or not at all Vision/Perception  Vision - History Baseline Vision: Other (comment) Visual History: Cataracts;Corrective eye surgery Ability to See in Adequate Light: 1 Impaired Patient Visual Report: No change from baseline Perception Perception: Within Functional Limits Praxis Praxis: Intact  Cognition Overall Cognitive Status: Within Functional Limits for tasks assessed Arousal/Alertness:  Awake/alert Orientation Level: Oriented X4 Year: 2023 Month: June Day of Week: Correct Attention: Focused;Sustained Focused Attention: Appears intact Sustained Attention: Appears intact Memory: Appears intact Awareness: Appears intact Problem Solving: Appears intact Safety/Judgment: Appears intact Sensation Sensation Light Touch: Impaired Detail Light Touch Impaired Details: Impaired RLE Proprioception: Impaired Detail Proprioception Impaired Details: Impaired RLE Coordination Gross Motor Movements are Fluid and Coordinated: Yes Fine Motor Movements are Fluid and Coordinated: Yes Coordination and Movement Description: much improved from eval Heel Shin Test: not assessed 2/2 body habitus Motor  Motor Motor: Abnormal postural alignment and control Motor - Skilled Clinical Observations: 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, R transmet amputation, body habitus Motor - Discharge Observations: 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, R transmet amputation, and body habitus  Mobility Bed Mobility Bed Mobility: Rolling Right;Rolling Left;Supine to Sit;Sit to Supine Rolling Right: Independent with assistive device Rolling Left: Independent with assistive device Supine to Sit: Independent with assistive device Sit to Supine: Independent with assistive device Transfers Transfers: Lateral/Scoot Transfers Lateral/Scoot Transfers: Minimal Assistance - Patient > 75%;Contact Guard/Touching assist Transfer (Assistive device): Other (Comment) (slide board) Locomotion  Gait Ambulation: No Gait Gait: No Stairs / Additional Locomotion Stairs: No Pick up small object from the floor (from standing position) activity did not occur: Safety/medical concerns Product manager Mobility: Yes Wheelchair Assistance: Independent with Camera operator: Power Wheelchair Parts Management: Needs assistance Distance: 150+  Trunk/Postural Assessment  Cervical Assessment Cervical Assessment: Within  Functional Limits Thoracic Assessment Thoracic Assessment: Exceptions to [facility] (rounded shoulders) Lumbar Assessment Lumbar Assessment: Exceptions to [facility] (posterior pelvic tilt) Postural Control Postural Control: Deficits on evaluation Trunk Control: impaired with dynamic sitting balance, posterior LOB Righting Reactions: delayed/insufficient  Balance Balance Balance Assessed: Yes Static Sitting Balance Static Sitting - Balance Support: No upper extremity supported;Feet supported Static Sitting - Level of Assistance: 5: Stand by assistance Dynamic Sitting Balance Dynamic Sitting - Balance Support: No upper extremity supported;Feet supported;During functional activity Dynamic Sitting - Level of Assistance: 4: Min assist Extremity Assessment   RLE Assessment RLE Assessment: Exceptions to [facility] Passive Range of Motion (PROM) Comments: dec DF ROM General Strength Comments: impaired, see below; s/p R transmet amputation RLE Strength Right Hip Flexion: 4/5 Right Knee Flexion: 4/5 Right Knee Extension: 4/5 LLE Assessment LLE Assessment: Exceptions to [facility] Passive Range of Motion (PROM) Comments: L BKA General Strength Comments: 4/5 hip and knee     Excell Seltzer, PT, DPT, CSRS 06/03/2022, 12:24 PM

## 2022-06-03 NOTE — Evaluation (Signed)
Recreational Therapy Assessment and Plan  Patient Details  Name: [name] MRN: [id number] Date of Birth: [birth year] Today's Date: 06/03/2022  Rehab Potential:  Good ELOS:   d/c 6/30  Assessment Hospital Problem: Principal Problem:   Status post transmetatarsal amputation of right foot [facility])     Past Medical History:      Past Medical History:  Diagnosis Date   Abscess of groin, left     Acute on chronic diastolic (congestive) heart failure ([place]) 02/05/2022   Acute on chronic respiratory failure with hypoxia ([place]) 04/11/2022   Acute osteomyelitis of ankle or foot, left ([place]) 01/31/2018   Acute respiratory failure with hypoxia and hypercarbia ([place]) 06/15/2020   Alveolar hypoventilation     Amputation stump infection ([place]) 04/09/2018   Anemia      not on iron pill   Asthma     Bipolar 2 disorder ([name])     Candidiasis of vagina 05/15/2022   Carpal tunnel syndrome on right      recurrent   Cellulitis 08/2010-08/2011   Chest pain with low risk for cardiac etiology 05/11/2021   Chronic pain     COPD (chronic obstructive pulmonary disease) ([name])      Symbicort daily and Proventil as needed   Costochondritis     De Quervain's tenosynovitis, bilateral 11/01/2015   Depression     Diabetes mellitus type II, uncontrolled 2000    Type 2, Uncontrolled.Takes Lantus daily.Fasting blood sugar runs 150   Diabetic ulcer of right foot ([place]) 12/19/2021   Drug-seeking behavior     Elevated troponin     Exposure to mold 04/21/2020   GERD (gastroesophageal reflux disease)      takes Pantoprazole and Zantac daily   HLD (hyperlipidemia)      takes Atorvastatin daily   Hypertension      takes Lisinopril and Coreg daily   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[place]) 04/11/2019   Left shoulder pain 06/23/2019   Morbid obesity ([place])     Morbid obesity with BMI of 60.0-69.9, adult ([place]) 04/15/2021   Nocturia     Nonobstructive atherosclerosis of coronary artery 11/26/2020    11/26/20 Coronary angiogram: 1.  Mild diffuse  proximal LAD stenosis with no evidence of obstructive disease (20 to 30% diffuse stenosis);  2.  Left dominant circumflex with widely patent obtuse marginal branches and left PDA branch, no significant stenoses present  3.  Nondominant RCA with mild diffuse nonobstructive stenosis    OSA on CPAP     Peripheral neuropathy      takes Gabapentin daily   Pneumonia      "walking" several yrs ago and as a baby (12/05/2018)   Pneumonia due to COVID-19 virus 04/14/2021   Primary osteoarthritis of first carpometacarpal joint of left hand 07/30/2016   Rectal fissure     Restless leg     Right carpal tunnel syndrome 09/01/2011   SVT (supraventricular tachycardia) ([place])     Syncope 02/25/2016   Thrombocytosis 04/11/2022   Urinary frequency     Urinary incontinence 10/23/2020   UTI (urinary tract infection) 04/15/2021   Varicose veins      Right medial thigh and Left leg     Past Surgical History:       Past Surgical History:  Procedure Laterality Date   AMPUTATION Left 02/01/2018    Procedure: LEFT FOURTH AND 5TH TOE RAY AMPUTATION;  Surgeon: [name], MD;  Location: [place];  Service: Orthopedics;  Laterality: Left;   AMPUTATION Left 03/03/2018  Procedure: LEFT BELOW KNEE AMPUTATION;  Surgeon: [name], MD;  Location: [place];  Service: Orthopedics;  Laterality: Left;   AMPUTATION Right 05/21/2022    Procedure: REVISION AMPUTATION RAY 1ST;  Surgeon: [name], DPM;  Location: [place];  Service: Podiatry;  Laterality: Right;   CARPAL TUNNEL RELEASE Bilateral     CESAREAN SECTION   2007   CORONARY ANGIOGRAPHY N/A 11/26/2020    Procedure: CORONARY ANGIOGRAPHY;  Surgeon: Sherren Mocha, MD;  Location: [place] CV LAB;  Service: Cardiovascular;  Laterality: N/A;   FOOT AMPUTATION Right     INCISION AND DRAINAGE ABSCESS Left 10/16/2021    Procedure: INCISION AND DRAINAGE ABSCESS;  Surgeon: Dwan Bolt, MD;  Location: [place];  Service: General;  Laterality: Left;   INCISION AND  DRAINAGE PERIRECTAL ABSCESS Left 05/18/2019    Procedure: IRRIGATION AND DEBRIDEMENT OF PANNIS ABSCESS, POSSIBLE DEBRIDEMENT OF BUTTOCK WOUND;  Surgeon: Donnie Mesa, MD;  Location: [place];  Service: General;  Laterality: Left;   IRRIGATION AND DEBRIDEMENT BUTTOCKS Left 05/17/2019    Procedure: IRRIGATION AND DEBRIDEMENT BUTTOCKS;  Surgeon: Donnie Mesa, MD;  Location: [place];  Service: General;  Laterality: Left;   KNEE ARTHROSCOPY Right 07/17/2010   LEFT HEART CATHETERIZATION WITH CORONARY ANGIOGRAM N/A 07/27/2012    Procedure: LEFT HEART CATHETERIZATION WITH CORONARY ANGIOGRAM;  Surgeon: Sherren Mocha, MD;  Location: [facility] CATH LAB;  Service: Cardiovascular;  Laterality: N/A;   MASS EXCISION N/A 06/29/2013    Procedure:  WIDE LOCAL EXCISION OF POSTERIOR NECK ABSCESS;  Surgeon: Ralene Ok, MD;  Location: [place];  Service: General;  Laterality: N/A;   REPAIR KNEE LIGAMENT Left      "fixed ligaments and chipped patella"   right transmetatarsal amputation           Assessment & Plan Clinical Impression:  [name] is a 46 year old female who presented to the emergency department on the morning of 05/14/2022 complaining of fatigue and polyuria.  In addition, she noted worsening pain to her chronic right foot ulcer.  She reported she has had elevated blood sugars over the past several days and her endocrinologist encouraged her to go to the emergency department for further management.  Her serum glucose was 793.  She was started on insulin infusion, IVFs, dextrose. MRI obtained of right foot.  Podiatry, infectious disease and orthopedic surgery consultations obtained.  The patient was taken to the OR on 6/16 and underwent transmetatarsal amputation revision by Dr. Boneta Lucks. Incision closed with staples. Tolerated well.  Dr., Recommended a 7-day course of Augmentin postoperatively for the possibility remaining soft tissue infection.  He anion gap Improved, transitioned to subcutaneous insulin,  she remained afebrile with tolerating carb modified diet.  PT and OT evaluations carried out. She reports her pain is under good control. She had OA/gout pain in her R knee, but this is improving. She The patient requires inpatient medicine and rehabilitation evaluations and services for ongoing dysfunction secondary to revision right transmetatarsal amputation. Patient transferred to CIR on 05/25/2022 .    Met with pt today to provide pt education on leisure education, activity analysis/modifications and stress management.  Also discussed the importance of social, emotional, spiritual health in addition to physical health and their effects on overall health and wellness.  Pt stated understanding.   Plan  No further TR as pt is discharging tomorrow  Recommendations for other services: None   Discharge Criteria: Patient will be discharged from TR if patient refuses treatment 3  consecutive times without medical reason.  If treatment goals not met, if there is a change in medical status, if patient makes no progress towards goals or if patient is discharged from hospital.  The above assessment, treatment plan, treatment alternatives and goals were discussed and mutually agreed upon: by patient  [place] 06/03/2022, 3:33 PM

## 2022-06-03 NOTE — Progress Notes (Signed)
Inpatient Rehabilitation Discharge Medication Review by a Pharmacist  A complete drug regimen review was completed for this patient to identify any potential clinically significant medication issues.  High Risk Drug Classes Is patient taking? Indication by Medication  Antipsychotic No   Anticoagulant No   Antibiotic No   Opioid Yes Percocet - pain  Antiplatelet No   Hypoglycemics/insulin Yes Insulin, Trulicity - DM  Vasoactive Medication Yes Amlodipine, torsemide -BP  Chemotherapy No   Other Yes Gabapentin - pain Duloxetine - pain, mood Symbicort, Spiriva- asthma      Type of Medication Issue Identified Description of Issue Recommendation(s)  Drug Interaction(s) (clinically significant)     Duplicate Therapy     Allergy     No Medication Administration End Date     Incorrect Dose     Additional Drug Therapy Needed     Significant med changes from prior encounter (inform family/care partners about these prior to discharge).    Other       Clinically significant medication issues were identified that warrant physician communication and completion of prescribed/recommended actions by midnight of the next day:  No   Pharmacist comments: None  Time spent performing this drug regimen review (minutes):  20 minutes   Tad Moore 06/03/2022 12:52 PM

## 2022-06-03 NOTE — Progress Notes (Signed)
Occupational Therapy Session Note  Patient Details  Name: [name] MRN: [id number] Date of Birth: [birth year]  Today's Date: 06/03/2022 OT Individual Time: 1345-1425 OT Individual Time Calculation (min): 40 min    Short Term Goals: Week 2:  OT Short Term Goal 1 (Week 2): STG = LTG due to ELOS  Skilled Therapeutic Interventions/Progress Updates:    Patient received up in wheelchair eating lunch.  Reviewed remaining OT goals and discharge,  Fielded any questions that patient had relating to discharge.  Patient feels very good with education that she has received and really feels learning slide board transfers has been beneficial.  Patient with one goal remaining - toilet transfers. Patient with need to void.  Patient able to self direct all steps of sliding board transfers to drop arm commode.  Patient continues to need mos assist for clothing management and total assist for hygiene.  Worked with patient on being able to remove slide board following transfer in each direction.   Patient left room to go to nursing station at end of session.  Excited for discharge tomorrow.    Therapy Documentation Precautions:  Precautions Precautions: Fall, Other (comment) Precaution Comments: hx of L BKA, prosthesis in room Restrictions Weight Bearing Restrictions: Yes RLE Weight Bearing: Non weight bearing Other Position/Activity Restrictions: LLE prosthetic in room   Pain: Pain Assessment Pain Score: 0-No pain      Therapy/Group: Individual Therapy  Mariah Milling 06/03/2022, 3:38 PM

## 2022-06-03 NOTE — Progress Notes (Signed)
Patient stated she wasn't ready to go on BIPAP yet but that she could put on herself later. RT checked machine and settings to ensure machine is working well. Told patient to call if she needed help.

## 2022-06-03 NOTE — Progress Notes (Signed)
PROGRESS NOTE   Subjective/Complaints:  Pt reports having more pain in R knee/R foot and esp in toes- feel like they are scrunched and burning.  Has been refusing afternoon gabapentin since makes too sleepy- likely why pain has increased.     ROS:  Pt denies SOB, abd pain, CP, N/V/C/D, and vision changes  Objective:   No results found. No results for input(s): "WBC", "HGB", "HCT", "PLT" in the last 72 hours.  Recent Labs    06/01/22 0614 06/02/22 0530  NA 138 140  K 4.9 4.2  CL 103 104  CO2 26 25  GLUCOSE 214* 180*  BUN 58* 59*  CREATININE 1.37* 1.30*  CALCIUM 8.9 8.9    Intake/Output Summary (Last 24 hours) at 06/03/2022 0802 Last data filed at 06/02/2022 2004 Gross per 24 hour  Intake 591 ml  Output --  Net 591 ml        Physical Exam: Vital Signs Blood pressure (!) 114/39, pulse 94, temperature 98 F (36.7 C), resp. rate 18, height '5\' 2"'$  (1.575 m), weight (!) 156.4 kg, SpO2 97 %.        General: asleep initially- woke some to stimuli, but didn't take off CPAP; NAD HENT: conjugate gaze; oropharynx moist CV: regular rate; no JVD Pulmonary: CTA B/L; no W/R/R- good air movement GI: soft, NT, ND, (+)BS Psychiatric: appropriate- sleepy and slightly irritable Neurological: Ox3  Musculoskeletal:        General: No tenderness.     Cervical back: No tenderness.     Right lower leg: Edema present.     Comments: Right foot surgical dressing dry and intact; on TMA;  left BKA well healed, shrinker in place- shrinker in place  Neurological:     General: No focal deficit present.     Mental Status: She is alert and oriented to person, place, and time.     Sensory: No sensory deficit.     Coordination: Coordination normal.     Comments: Follows commands and answers questions Strength 5/5 in b/l UE Strength 4/5 R hip flexion and knee extension Strength 5/5 L hip flexion and 4+/5 knee extension     Assessment/Plan: 1. Functional deficits which require 3+ hours per day of interdisciplinary therapy in a comprehensive inpatient rehab setting. Physiatrist is providing close team supervision and 24 hour management of active medical problems listed below. Physiatrist and rehab team continue to assess barriers to discharge/monitor patient progress toward functional and medical goals  Care Tool:  Bathing    Body parts bathed by patient: Right arm, Left arm, Chest, Abdomen, Right upper leg, Left upper leg, Face   Body parts bathed by helper: Front perineal area, Buttocks, Right lower leg Body parts n/a: Left lower leg   Bathing assist Assist Level: Minimal Assistance - Patient > 75%     Upper Body Dressing/Undressing Upper body dressing   What is the patient wearing?: Bra, Pull over shirt    Upper body assist Assist Level: Minimal Assistance - Patient > 75%    Lower Body Dressing/Undressing Lower body dressing    Lower body dressing activity did not occur:  (Left prosthesis and RLE NWB from recent surgery (Right  foot bandaged)) What is the patient wearing?: Ace wrap/stump shrinker, Pants     Lower body assist Assist for lower body dressing: Moderate Assistance - Patient 50 - 74%     Toileting Toileting    Toileting assist Assist for toileting: Moderate Assistance - Patient 50 - 74%     Transfers Chair/bed transfer  Transfers assist     Chair/bed transfer assist level: Minimal Assistance - Patient > 75%     Locomotion Ambulation   Ambulation assist   Ambulation activity did not occur: Safety/medical concerns          Walk 10 feet activity   Assist  Walk 10 feet activity did not occur: Safety/medical concerns        Walk 50 feet activity   Assist Walk 50 feet with 2 turns activity did not occur: Safety/medical concerns         Walk 150 feet activity   Assist Walk 150 feet activity did not occur: Safety/medical concerns         Walk  10 feet on uneven surface  activity   Assist Walk 10 feet on uneven surfaces activity did not occur: Safety/medical concerns         Wheelchair     Assist Is the patient using a wheelchair?: Yes Type of Wheelchair: Power    Wheelchair assist level: Independent Max wheelchair distance: >150 ft    Wheelchair 50 feet with 2 turns activity    Assist        Assist Level: Independent   Wheelchair 150 feet activity     Assist      Assist Level: Independent   Blood pressure (!) 114/39, pulse 94, temperature 98 F (36.7 C), resp. rate 18, height '5\' 2"'$  (1.575 m), weight (!) 156.4 kg, SpO2 97 %.  Medical Problem List and Plan: 1. Functional deficits secondary to revision to right foot ray amputation due to osteomyelitis R foot and recent admission for HHS treatment             -patient may  shower please cover surgical incision             -ELOS/Goals: ELOS 7 to 10 days Con't CIR- PT and OT- team conference today to determine length of stay  today-       - using power w/c, but working mainly on ADLs and transfers- NWB RLE and has L BKA- old  Con't CIR- PT and OT- d/c tomorrow 2.  Antithrombotics: -DVT/anticoagulation:  Pharmaceutical: Lovenox 80 mg daily             -antiplatelet therapy: Aspirin 81 mg daily 3. Pain Management: Tylenol as needed --Neurontin 900 mg 3 times daily, Duloxetine '60mg'$  daily             --Oxycodone 7.5/325 every 6 hours as needed  6/26- pain doing better- con't regimen  6/29- pain has been worse with holding afternoon gabapentin- will change to 1200 mg BID to stop afternoon dose 4. Mood/Sleep: LCSW to evaluate and provide emotional support             -Bipolar disorder: Continue Cymbalta DR 60 mg daily             -antipsychotic agents: n/a 5. Neuropsych/cognition: This patient is capable of making decisions on her own behalf. 6. Skin/Wound Care: Skin care checks             --monitor right foot surgical incision              --  continue Augmentin through Friday 6/23 7. Fluids/Electrolytes/Nutrition: Routine I's and O's and follow-up chemistries 8: Gout, acute flare right knee: improving, Continue colchicine 0.3 mg daily 9: Revison of right first ray amputation 1/16>>NWB             --follow-up after discharge with podiatry, Dr. Posey Pronto             -Continue Augmentin until 6/23             --dressing changes as per podiatry 10: DM-2: continue sliding scale insulin with meals and at bedtime             --continue meal coverage NovoLog 20 units             --continue Semglee 50 units twice daily             --follow-up endocrinology/Dr. [place]  CBG (last 3)  Recent Labs    06/02/22 1612 06/02/22 2019 06/03/22 0547  GLUCAP 206* 193* 195*  Elevated am CBG increase semglee  6/26- Pt's insulin just increased- CBGs 82-150s- but 1 value 267- will monitor trend.   6/27- educated pt today her CBG's are going into 300s again, because she's eating at cafeteria- advised her to stick to her DM diet- because increasing insulin so she can eat more is not the goal   6/29- CBG's doing better last 2 days- pt's A1c is 15 11: Diastolic heart failure: stable, monitor symptoms.  Daily weight.             -She takes home torsemide, it was held due to AKI.  Consider restarted when appropriate   6/21- will restart Torsemide 20 mg in AM and pm and monitor BMP/weight- might need CXR tomorrow  6/22- will stop Torsemide since Cr bumped a lot to 1.83- and BUN up to 41 from 32- did lose ~ 4 lbs overnight with peeing. Wil get CXR to f/u on fluid status.   6/23- CXR looks good- will not keep on Torsemide, however if has any fluid overload, can give 1x dose of Torsemide 20 mg.   Orthopnea sx after hi intake of fluids yesterday will give torsemide '20mg'$  this am may need to repeat tonight if pt still has orthopnea    6/26- weight up 10 kg- which doesn't make sense, although possible- will ask for weight to be rechecked-   6/27-  weight was 162 kg- so given Torsemide 40 mg BID by family medicine service who was consulted-   6/28- Weight 165 kg today- in spite of Torsemide- 6/29- weight down to 156.4 kg- con't regimen  12: Hypertension: continue losartan 50 mg daily Vitals:   06/02/22 2022 06/03/22 0555  BP: (!) 155/75 (!) 114/39  Pulse: 100 94  Resp: 18 18  Temp: 98.3 F (36.8 C) 98 F (36.7 C)  SpO2: 100% 97%   Elevated , intake 2800 ml po will   6/27- Losartan held due to Hyperkalemia- will ask for Family medicine address what to replace Losartan with  6/27- adding back home med of Norvasc -BP looks good.   6/29- BP doing better- con't regimen 13: COPD: continue Yupelri nebulizer daily, Dulera inhaler twice daily 14: Acute kidney injury/CKD stage III: monitor serum BUN and creatinine. Last Cr 1.53  6/26- Cr 1.71 and BUN up dramatically to 63- will give 500cc IVFs and recheck- might need more Torsemide, but I'm concerned about balancing kidneys and CHF.   6/27- Given Torsemide 40 mg BID- Cr down to 1.37  and BUN 58- per Family Medicine  6/28- Cr/BUN stable on Torsemide- will con't 15: Leukocytosis: continue Augmentin and monitor CBC, incision, fever  6/21- WBC down to 11k- will con't to monitor clinically  6/23- WBC down to 9k- monitor clinically 16: Anemia: normal red blood cell indices.  Follow-up CBC. Last HGB 9.7 17: Left BKA four years ago; requesting [employer] referral for new shrinker  6/28- getting [employer] in to fix fitting of prosthesis 18: OSA: uses cpap at night with O2 2L, continue 19. Anxiety  6/21- will get Neuropsych involved if need be  20. Insomnia  6/21- will restart home Melatonin 10 mg QHS  6/22- slept great with getting trazodone- will con't 21. Hyperkalemia  6/22- K+ 5.7- will give Lokelma 10g x1 and recheck labs in AM  6/23- K+ 4.7 today- will recheck Monday  6/26- K+ 5.3- will give Lokelma again and recheck in AM  6/27- K+ 4.9- did take half dose of Lokelma  6/28- K+ 4.2- con't off  Losartan 22. AKI  6/22- Cr up to 1.83 from 1.5 and BUN 42 from 32- will need to stop Torsemide and monitor daily weights  6/23- Cr down to 1.67 and BUN up to 47- off Torsemide- will not give unless gets fluid overloaded.   6/26- per #14 as above 23. Constipation  6/26- will give Sorbitol at 3pm if no result by then- has been 4 days. Could be adding to weight gain.  24. Dispo  6/22- pt asking for off floor pass- will d/w PT   6/23- OK for pass  6/27- educated pt can leave floor, but needs to stick to DM diet.    I spent a total of  36  minutes on total care today- >50% coordination of care- due to d/w pt about pain as well as nursing about options for foot/knee /nerve pain   LOS: 9 days A FACE TO FACE EVALUATION WAS PERFORMED  Norell Brisbin 06/03/2022, 8:02 AM

## 2022-06-03 NOTE — Progress Notes (Signed)
Patient ID: [name], female   DOB: [birth year], 46 y.o.   MRN: [id number] Pt requesting chaplain to return to notorize her HCPOA forms. Have contacted Spiritual care and left a message regarding pt's discharge tomorrow and to see if could come up and see her.

## 2022-06-03 NOTE — Progress Notes (Signed)
Inpatient Rehabilitation Care Coordinator Discharge Note   Patient Details  Name: [name] MRN: [id number] Date of Birth: 10-Dec-1975   Discharge location: D/c to home  Length of Stay: 10 days  Discharge activity level: Supervision  Home/community participation: LImited  Patient response XN:ATFTDD Literacy - How often do you need to have someone help you when you read instructions, pamphlets, or other written material from your doctor or pharmacy?: Never  Patient response UK:GURKYH Isolation - How often do you feel lonely or isolated from those around you?: Never  Services provided included: MD, RD, PT, Pharmacy, Neuropsych, TR, RN, CM, OT  Financial Services:  Financial Services Utilized: Medicaid    Choices offered to/list presented to: Yes  Follow-up services arranged:  DME, [place]: [place] ([place]) for HHPT/OT    DME : [place] for transfer board    Patient response to transportation need: Is the patient able to respond to transportation needs?: Yes In the past 12 months, has lack of transportation kept you from medical appointments or from getting medications?: No In the past 12 months, has lack of transportation kept you from meetings, work, or from getting things needed for daily living?: No   Comments (or additional information):  Patient/Family verbalized understanding of follow-up arrangements:  Yes  Individual responsible for coordination of the follow-up plan: contact pt  Confirmed correct DME delivered: Rana Snare 06/03/2022    Rana Snare

## 2022-06-03 NOTE — Progress Notes (Signed)
Occupational Therapy Session Note  Patient Details  Name: [name] MRN: [id number] Date of Birth: [birth year]  Today's Date: 06/03/2022 OT Individual Time: 6387-5643 OT Individual Time Calculation (min): 40 min (make up minutes)    Short Term Goals: Week 1:  OT Short Term Goal 1 (Week 1): STG = LTG due to ELOS OT Short Term Goal 1 - Progress (Week 1): Progressing toward goal  Skilled Therapeutic Interventions/Progress Updates:  Pt seen for make up OT session due to limited time yesterday due to care conference scheduling. Pt for Grad Day today and has multiple OT sessions to complete POC. Pt in bed with CPAP mask in place and in side lying with c/o this am of 8/10 R LE knee pain. Spoke with nursing who was administering meds in the next few minutes and working on clarifying Gabapentin med order. Pt agreeable to begin AM self care with toileting bed level and was relatively independent with all bed mobility to roll and place pan for voiding. See Flowsheets. Pt able to assist with all peri hygiene and sponge bathing with OT assist for within deep skin folds and buttocks region. Pt sat at EOB from fully flat supine with min A only this session. Pt applied deodorant and lotion and then began AM meal in prep for next OT session. Left pt at EOB with call button and all needs within place.   Therapy Documentation Precautions:  Precautions Precautions: Fall, Other (comment) Precaution Comments: hx of L BKA, prosthesis in room Restrictions Weight Bearing Restrictions: Yes RLE Weight Bearing: Non weight bearing Other Position/Activity Restrictions: LLE prosthetic in room   Therapy/Group: Individual Therapy  Barnabas Lister 06/03/2022, 9:07 AM

## 2022-06-03 NOTE — Progress Notes (Signed)
Occupational Therapy Discharge Summary  Patient Details  Name: [name] MRN: [id number] Date of Birth: 08/23/1976  Patient has met 5 of 7 long term goals due to improved activity tolerance, improved balance, and ability to compensate for deficits.  Pt made steady progress with BADLs and functional transfers during this admission. Bathing/dressing performed EOB and bed level. Bathing with min A overall (max A for LB bathing at bed level). UB dressing with supervision and LB dressing with max A. SB transfers to [facility] with CGA after SB placement. Toleting with max A. Pt's daughter and caregiver have completed education and provided the appropriate level of assistance/supervision. Patient to discharge at [facility] Max Assist level.  Patient's care partner is independent to provide the necessary physical assistance at discharge.    Reasons goals not met: Pt requires max A for LB dressing tasks and toileting tasks.  Recommendation:  Patient will benefit from ongoing skilled OT services in home health setting to continue to advance functional skills in the area of BADL.  Equipment: No equipment provided  Reasons for discharge: treatment goals met  Patient/family agrees with progress made and goals achieved: Yes  OT Discharge ADL ADL Eating: Independent Where Assessed-Eating: Edge of bed Grooming: Independent Where Assessed-Grooming: Edge of bed Upper Body Bathing: Setup Where Assessed-Upper Body Bathing: Edge of bed Lower Body Bathing: Moderate assistance Where Assessed-Lower Body Bathing: Edge of bed, Bed level Upper Body Dressing: Modified independent (Device) Where Assessed-Upper Body Dressing: Edge of bed Lower Body Dressing: Moderate assistance Where Assessed-Lower Body Dressing: Edge of bed, Bed level Toileting: Maximal assistance Where Assessed-Toileting: Bedside Commode Toilet Transfer: Minimal assistance Toilet Transfer Method: Theatre manager: Extra  wide bedside commode, Drop arm bedside commode Tub/Shower Transfer: Not assessed, Unable to assess Tub/Shower Transfer Method: Unable to assess [employer] Transfer: Not assessed, Unable to assess Social research officer, government Method: Unable to assess Vision Baseline Vision/History: 1 Wears glasses Patient Visual Report: No change from baseline Vision Assessment?: No apparent visual deficits Perception  Perception: Within Functional Limits Praxis Praxis: Intact Cognition Cognition Overall Cognitive Status: Within Functional Limits for tasks assessed Arousal/Alertness: Awake/alert Orientation Level: Person;Place;Situation Memory: Appears intact Attention: Focused;Sustained Focused Attention: Appears intact Sustained Attention: Appears intact Awareness: Appears intact Problem Solving: Appears intact Safety/Judgment: Appears intact Brief Interview for Mental Status (BIMS) Repetition of Three Words (First Attempt): 3 Temporal Orientation: Year: Correct Temporal Orientation: Month: Accurate within 5 days Temporal Orientation: Day: Correct Recall: "Sock": Yes, no cue required Recall: "Blue": Yes, no cue required Recall: "Bed": Yes, no cue required BIMS Summary Score: 15 Sensation Sensation Light Touch: Impaired Detail Light Touch Impaired Details: Impaired RLE Proprioception: Impaired Detail Proprioception Impaired Details: Impaired RLE Coordination Gross Motor Movements are Fluid and Coordinated: Yes Fine Motor Movements are Fluid and Coordinated: Yes Motor  Motor Motor: Abnormal postural alignment and control Motor - Skilled Clinical Observations: 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, R transmet amputation, body habitus Trunk/Postural Assessment  Cervical Assessment Cervical Assessment: Within Functional Limits Thoracic Assessment Thoracic Assessment: Exceptions to [facility] (rounded shoulders) Lumbar Assessment Lumbar Assessment: Exceptions to [facility] (posterior pelvic tilt) Postural  Control Trunk Control: impaired with dynamic sitting balance, posterior LOB Righting Reactions: delayed/insufficient  Balance Static Sitting Balance Static Sitting - Balance Support: Bilateral upper extremity supported;Feet unsupported Static Sitting - Level of Assistance: 5: Stand by assistance Dynamic Sitting Balance Dynamic Sitting - Balance Support: During functional activity Dynamic Sitting - Level of Assistance: 4: Min assist Extremity/Trunk Assessment RUE Assessment RUE Assessment: Within Functional Limits LUE  Assessment LUE Assessment: Exceptions to [facility] Active Range of Motion (AROM) Comments: shoulder flexion 75% range, abduction 75%, functional IR/er General Strength Comments: Overall shoulder strength: 4+/5 all ranges.   Leotis Shames [facility] 06/03/2022, 6:57 AM

## 2022-06-03 NOTE — Progress Notes (Signed)
Patient ID: [name], female   DOB: 05/28/1976, 46 y.o.   MRN: [id number]  HHPT/OT referral declined by Jennifer/[facility].   SW sent out [facility] referrals and waiting on follow-up.  SW spoke with Erica/[facility] and repots only able to offer PT at this time, and PT can address OT needs. SW informed will follow-up once able to confirm with therapy team.  SW spoke with [facility] and reports referral declined as no resources in area.   HHPT/OT referral accepted by [facility] ([facility]).   SW met with pt inform on above updates about HHA. Confirms [facility] transportation will be here tomorrow at [place]

## 2022-06-03 NOTE — Progress Notes (Signed)
Occupational Therapy Session Note  Patient Details  Name: [name] MRN: [id number] Date of Birth: [birth year]  Today's Date: 06/03/2022 OT Individual Time: 5732-2025 OT Individual Time Calculation (min): 75 min    Short Term Goals: Week 1:  OT Short Term Goal 1 (Week 1): STG = LTG due to ELOS OT Short Term Goal 1 - Progress (Week 1): Progressing toward goal Week 2:  OT Short Term Goal 1 (Week 2): STG = LTG due to ELOS  Skilled Therapeutic Interventions/Progress Updates:     Patient seen this AM for BADL related task in dressing and grooming,the pt was able to come from supine to sidelying for placement of the bed pan with SBA.  The pt was able to wipe herself after voiding and transition from supine to EOB, using the SB the pt was able to transfer from EOB to the w/c with MinA.  The pt expressed a desire to wash her hair, she was able to shampoo and dry her hair with MaxA secondary to decrease activity tolerance and BUE weakness.  The pt was able to brush her teeth with s/u assist .  The pt was able to donn her bra with ModA and donn a over head top with s/u assist.  The pt completed UB exercises using a 3lb dumb bell to improve BUE strength 2 sets of 10 with rest breaks for bicep curls, shld flexion, and horizontal abduction.  The pt completed resistive exercises using a 1lb dowel 4 sets with a count of 10 against resistance.  The pt was instructed in a HEP using medium grade theraband 2 set of 10 for shld flexion, chest press, and horizontal abduction.  The pt remained at w/c LOF with her call light and bed side table within reach.  The pt had no c/o pain this OT treatment session.   Therapy Documentation Precautions:  Precautions Precautions: Fall, Other (comment) Precaution Comments: hx of L BKA, prosthesis in room Restrictions Weight Bearing Restrictions: Yes RLE Weight Bearing: Non weight bearing Other Position/Activity Restrictions: LLE prosthetic in room  Therapy/Group:  Individual Therapy  Yvonne Kendall 06/03/2022, 12:00 PM

## 2022-06-04 ENCOUNTER — Other Ambulatory Visit (HOSPITAL_COMMUNITY): Payer: Self-pay

## 2022-06-04 LAB — GLUCOSE, CAPILLARY: Glucose-Capillary: 187 mg/dL — ABNORMAL HIGH (ref 70–99)

## 2022-06-04 MED ORDER — AMLODIPINE BESYLATE 10 MG PO TABS: 10.0000 mg | ORAL_TABLET | Freq: Every day | ORAL | 0 refills | Status: DC

## 2022-06-04 MED ORDER — GABAPENTIN 600 MG PO TABS: 1200.0000 mg | ORAL_TABLET | Freq: Two times a day (BID) | ORAL | 0 refills | Status: DC

## 2022-06-04 MED ORDER — DULOXETINE HCL 30 MG PO CPEP: 30.0000 mg | ORAL_CAPSULE | Freq: Every day | ORAL | 5 refills | Status: DC

## 2022-06-04 MED ORDER — TRESIBA FLEXTOUCH 200 UNIT/ML ~~LOC~~ SOPN: 52.0000 [IU] | PEN_INJECTOR | Freq: Two times a day (BID) | SUBCUTANEOUS | 3 refills | Status: DC

## 2022-06-04 MED ORDER — PREDNISOLONE ACETATE 1 % OP SUSP: 1.0000 [drp] | Freq: Four times a day (QID) | OPHTHALMIC | 0 refills | Status: DC

## 2022-06-04 MED ORDER — TRAZODONE HCL 50 MG PO TABS
25.0000 mg | ORAL_TABLET | Freq: Every evening | ORAL | 0 refills | Status: DC | PRN
Start: 2022-06-04 — End: 2022-07-22

## 2022-06-04 MED ORDER — ACETAMINOPHEN 325 MG PO TABS
325.0000 mg | ORAL_TABLET | ORAL | Status: DC | PRN
Start: 2022-06-04 — End: 2023-06-11

## 2022-06-04 MED ORDER — TORSEMIDE 40 MG PO TABS: 40.0000 mg | ORAL_TABLET | Freq: Two times a day (BID) | ORAL | 0 refills | Status: DC

## 2022-06-04 NOTE — TOC Benefit Eligibility Note (Signed)
Patient Teacher, English as a foreign language completed.    The patient is currently admitted and upon discharge could be taking Yupelri Solution.  Requires Prior Authorization.  Prefers Atrovent Neb and Duoneb  The patient is insured through [employer] Patient Advocate Specialist [place] Patient Advocate Team Direct Number: [phone]  Fax: [phone]

## 2022-06-04 NOTE — Progress Notes (Signed)
   06/04/22 0940  Clinical Encounter Type  Visited With Patient;Health care provider Risa Grill, [place])  Visit Type Psychological support;Social support  Referral From Patient  Consult/Referral To Chaplain Melvenia Beam)  Recommendations Referral to Behavioral Health  Spiritual Encounters  Spiritual Needs Emotional;Grief support  Stress Factors  Patient Stress Factors Family relationships;Financial concerns   After completing patient's Advance Directive [name] mentioned that she was extremely stressed about the emotionally hostile home environment that she was being discharged to. Physician Assistant Risa Grill joined our conversation and all were in agreement that a referral to [employer] would greatly benefit [name]'s progress. Dr. Ilean Skill, Psy.D. was contacted and Referral was made to [facility] [address] [place], [place] [phone]

## 2022-06-04 NOTE — Progress Notes (Signed)
Patient discharged this shift by staff. Patient  Voices understanding of discharge instruction.

## 2022-06-04 NOTE — Progress Notes (Signed)
PROGRESS NOTE   Subjective/Complaints:  Pt reports no issues this AM Ready for d/c.   ROS:  Pt denies SOB, abd pain, CP, N/V/C/D, and vision changes  Objective:   No results found. No results for input(s): "WBC", "HGB", "HCT", "PLT" in the last 72 hours.  Recent Labs    06/02/22 0530  NA 140  K 4.2  CL 104  CO2 25  GLUCOSE 180*  BUN 59*  CREATININE 1.30*  CALCIUM 8.9    Intake/Output Summary (Last 24 hours) at 06/04/2022 0738 Last data filed at 06/03/2022 1750 Gross per 24 hour  Intake 480 ml  Output --  Net 480 ml        Physical Exam: Vital Signs Blood pressure (!) 141/61, pulse 99, temperature 98 F (36.7 C), temperature source Oral, resp. rate 18, height '5\' 2"'$  (1.575 m), weight (!) 156.4 kg, SpO2 96 %.         General: awake, alert, appropriate, sleepy- woke to stimuli; wearing CPAP; NAD HENT: conjugate gaze; oropharynx moist CV: regular rate; no JVD Pulmonary: CTA B/L; no W/R/R- good air movement GI: soft, NT, ND, (+)BS Psychiatric: appropriate- sleepy Neurological: Ox3 Musculoskeletal:        General: No tenderness.     Cervical back: No tenderness.     Right lower leg: Edema present.     Comments: Right foot surgical dressing dry and intact; on TMA;  left BKA well healed, shrinker in place- shrinker in place  Neurological:     General: No focal deficit present.     Mental Status: She is alert and oriented to person, place, and time.     Sensory: No sensory deficit.     Coordination: Coordination normal.     Comments: Follows commands and answers questions Strength 5/5 in b/l UE Strength 4/5 R hip flexion and knee extension Strength 5/5 L hip flexion and 4+/5 knee extension    Assessment/Plan: 1. Functional deficits which require 3+ hours per day of interdisciplinary therapy in a comprehensive inpatient rehab setting. Physiatrist is providing close team supervision and 24 hour  management of active medical problems listed below. Physiatrist and rehab team continue to assess barriers to discharge/monitor patient progress toward functional and medical goals  Care Tool:  Bathing    Body parts bathed by patient: Right arm, Left arm, Chest, Abdomen, Right upper leg, Left upper leg, Face, Front perineal area   Body parts bathed by helper: Right lower leg, Buttocks Body parts n/a: Left lower leg   Bathing assist Assist Level: Minimal Assistance - Patient > 75%     Upper Body Dressing/Undressing Upper body dressing   What is the patient wearing?: Bra, Pull over shirt    Upper body assist Assist Level: Minimal Assistance - Patient > 75%    Lower Body Dressing/Undressing Lower body dressing    Lower body dressing activity did not occur:  (Left prosthesis and RLE NWB from recent surgery (Right foot bandaged)) What is the patient wearing?: Ace wrap/stump shrinker, Pants     Lower body assist Assist for lower body dressing: Moderate Assistance - Patient 50 - 74%     Chartered loss adjuster  assist Assist for toileting: Moderate Assistance - Patient 50 - 74%     Transfers Chair/bed transfer  Transfers assist     Chair/bed transfer assist level: Contact Guard/Touching assist     Locomotion Ambulation   Ambulation assist   Ambulation activity did not occur: Safety/medical concerns          Walk 10 feet activity   Assist  Walk 10 feet activity did not occur: Safety/medical concerns        Walk 50 feet activity   Assist Walk 50 feet with 2 turns activity did not occur: Safety/medical concerns         Walk 150 feet activity   Assist Walk 150 feet activity did not occur: Safety/medical concerns         Walk 10 feet on uneven surface  activity   Assist Walk 10 feet on uneven surfaces activity did not occur: Safety/medical concerns         Wheelchair     Assist Is the patient using a wheelchair?:  Yes Type of Wheelchair: Power    Wheelchair assist level: Independent Max wheelchair distance: >150 ft    Wheelchair 50 feet with 2 turns activity    Assist        Assist Level: Independent   Wheelchair 150 feet activity     Assist      Assist Level: Independent   Blood pressure (!) 141/61, pulse 99, temperature 98 F (36.7 C), temperature source Oral, resp. rate 18, height '5\' 2"'$  (1.575 m), weight (!) 156.4 kg, SpO2 96 %.  Medical Problem List and Plan: 1. 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  Antithrombotics: -DVT/anticoagulation:  Pharmaceutical: Lovenox 80 mg daily             -antiplatelet therapy: Aspirin 81 mg daily 3. Pain Management: Tylenol as needed --Neurontin 900 mg 3 times daily, Duloxetine '60mg'$  daily             --Oxycodone 7.5/325 every 6 hours as needed  6/26- pain doing better- con't regimen  6/29- pain has been worse with holding afternoon gabapentin- will change to 1200 mg BID to stop afternoon dose 4. Mood/Sleep: LCSW to evaluate and provide emotional support             -Bipolar disorder: Continue Cymbalta DR 60 mg daily             -antipsychotic agents: n/a 5. Neuropsych/cognition: This patient is capable of making decisions on her own behalf. 6. Skin/Wound Care: Skin care checks             --monitor right foot surgical incision             --continue Augmentin through Friday 6/23 7. Fluids/Electrolytes/Nutrition: Routine I's and O's and follow-up chemistries 8: Gout, acute flare right knee:  improving, Continue colchicine 0.3 mg daily 9: Revison of right  first ray amputation 1/16>>NWB             --follow-up after discharge with podiatry, Dr. Posey Pronto             -Continue Augmentin until 6/23             --dressing changes as per podiatry 10: DM-2: continue sliding scale insulin with meals and at bedtime             --continue meal coverage NovoLog 20 units             --continue Semglee 50 units twice daily             --follow-up endocrinology/Dr. [place]  CBG (last 3)  Recent Labs    06/03/22 1650 06/03/22 2101 06/04/22 0736  GLUCAP 212* 234* 187*  Elevated am CBG increase semglee  6/26- Pt's insulin just increased- CBGs 82-150s- but 1 value 267- will monitor trend.   6/27- educated pt today her CBG's are going into 300s again, because she's eating at cafeteria- advised her to stick to her DM diet- because increasing insulin so she can eat more is not the goal   6/30- pt's CBGs low 200s at max- con't regimen- O1H 15 11: Diastolic heart failure: stable, monitor symptoms.  Daily weight.             -She takes home torsemide, it was held due to AKI.  Consider restarted when appropriate   6/21- will restart Torsemide 20 mg in AM and pm and monitor BMP/weight- might need CXR tomorrow  6/22- will stop Torsemide since Cr bumped a lot to 1.83- and BUN up to 41 from 32- did lose ~ 4 lbs overnight with peeing. Wil get CXR to f/u on fluid status.   6/23- CXR looks good- will not keep on Torsemide, however if has any fluid overload, can give 1x dose of Torsemide 20 mg.   Orthopnea sx after hi intake of fluids yesterday will give torsemide '20mg'$  this am may need to repeat tonight if pt still has orthopnea    6/26- weight up 10 kg- which doesn't make sense, although possible- will ask for weight to be rechecked-   6/27- weight was 162 kg- so given Torsemide 40 mg BID by family medicine service who was consulted-   6/28- Weight 165 kg today- in spite of Torsemide- 6/29-  weight down to 156.4 kg- con't regimen 6/30- willl go home on Torsemide  12: Hypertension: continue losartan 50 mg daily Vitals:   06/03/22 2337 06/04/22 0248  BP:  (!) 141/61  Pulse: (!) 105 99  Resp: 16 18  Temp:  98 F (36.7 C)  SpO2: 95% 96%   Elevated , intake 2800 ml po will   6/27- Losartan held due to Hyperkalemia- will ask for Family medicine address what to replace Losartan with  6/27- adding back home med of Norvasc -BP looks good.   6/29- BP doing better- con't regimen 13: COPD: continue Yupelri nebulizer daily, Dulera inhaler twice daily 14: Acute kidney injury/CKD stage III: monitor serum BUN and creatinine. Last Cr 1.53  6/26- Cr 1.71 and BUN up dramatically to 63- will give 500cc IVFs and recheck- might need more Torsemide, but I'm concerned about balancing kidneys and CHF.   6/27- Given Torsemide 40 mg BID- Cr down to 1.37 and BUN 58- per Family Medicine  6/28- Cr/BUN stable on Torsemide- will con't 15: Leukocytosis: continue Augmentin and monitor CBC, incision, fever  6/21- WBC down  to 11k- will con't to monitor clinically  6/23- WBC down to 9k- monitor clinically 16: Anemia: normal red blood cell indices.  Follow-up CBC. Last HGB 9.7 17: Left BKA four years ago; requesting [employer] referral for new shrinker  6/28- getting [employer] in to fix fitting of prosthesis 18: OSA: uses cpap at night with O2 2L, continue 19. Anxiety  6/21- will get Neuropsych involved if need be  20. Insomnia  6/21- will restart home Melatonin 10 mg QHS  6/22- slept great with getting trazodone- will con't 21. Hyperkalemia  6/22- K+ 5.7- will give Lokelma 10g x1 and recheck labs in AM  6/23- K+ 4.7 today- will recheck Monday  6/26- K+ 5.3- will give Lokelma again and recheck in AM  6/27- K+ 4.9- did take half dose of Lokelma  6/28- K+ 4.2- con't off Losartan 22. AKI  6/22- Cr up to 1.83 from 1.5 and BUN 42 from 32- will need to stop Torsemide and monitor daily weights  6/23- Cr down to  1.67 and BUN up to 47- off Torsemide- will not give unless gets fluid overloaded.   6/26- per #14 as above 23. Constipation  6/26- will give Sorbitol at 3pm if no result by then- has been 4 days. Could be adding to weight gain.  24. Dispo  6/22- pt asking for off floor pass- will d/w PT   6/23- OK for pass  6/27- educated pt can leave floor, but needs to stick to DM diet.      LOS: 10 days A FACE TO FACE EVALUATION WAS PERFORMED  Morrisa Aldaba 06/04/2022, 7:38 AM

## 2022-06-04 NOTE — Progress Notes (Signed)
   06/04/22 0910  Clinical Encounter Type  Visited With Patient;Health care provider (Notary: Harvin Hazel; and Volunteer Witnesses Marella Chimes and Oleh Genin)  Visit Type Follow-up;Spiritual support  Referral From Chaplain Melody Haver)  Consult/Referral To Chaplain Albertina Parr Morene Crocker)  Recommendations Signing of Advance Directive   Requested to coordinate Notary and two volunteers from [employer] for the witnessing of [name] signing of her Advance Directive: [place] and Living Will.   Met [name] at the patient's bedside. Patient indicated that she has chosen [employer] [phone] and Richard Miu [phone] to be Health Care Power of Attorney's, which are clearly indicated on PART A: H.C. P.O.A. and that her wishes were clearly indicated on PART B: Living Will   [name] signed her Advance Directive in the presence of [place]; and volunteer witnesses Marella Chimes and Oleh Genin, all of whom have signed in the appropriate spaces on Part: C.    A copy of [name]' A.D. was placed in her hard chart, a copy was emailed for scanning into her electronic medical record, the original and two copies were then returned to the patient.   [place], [place]. [phone].

## 2022-06-06 IMAGING — DX DG CHEST 2V
2 series · 2 of 2 positions shown · non-contrast
Comparison: Portable chest 04/14/2021 and earlier.

CLINICAL DATA: 45-year-old female with shortness of breath and
chest pain.

EXAM:
CHEST - 2 VIEW

[x chest ap]
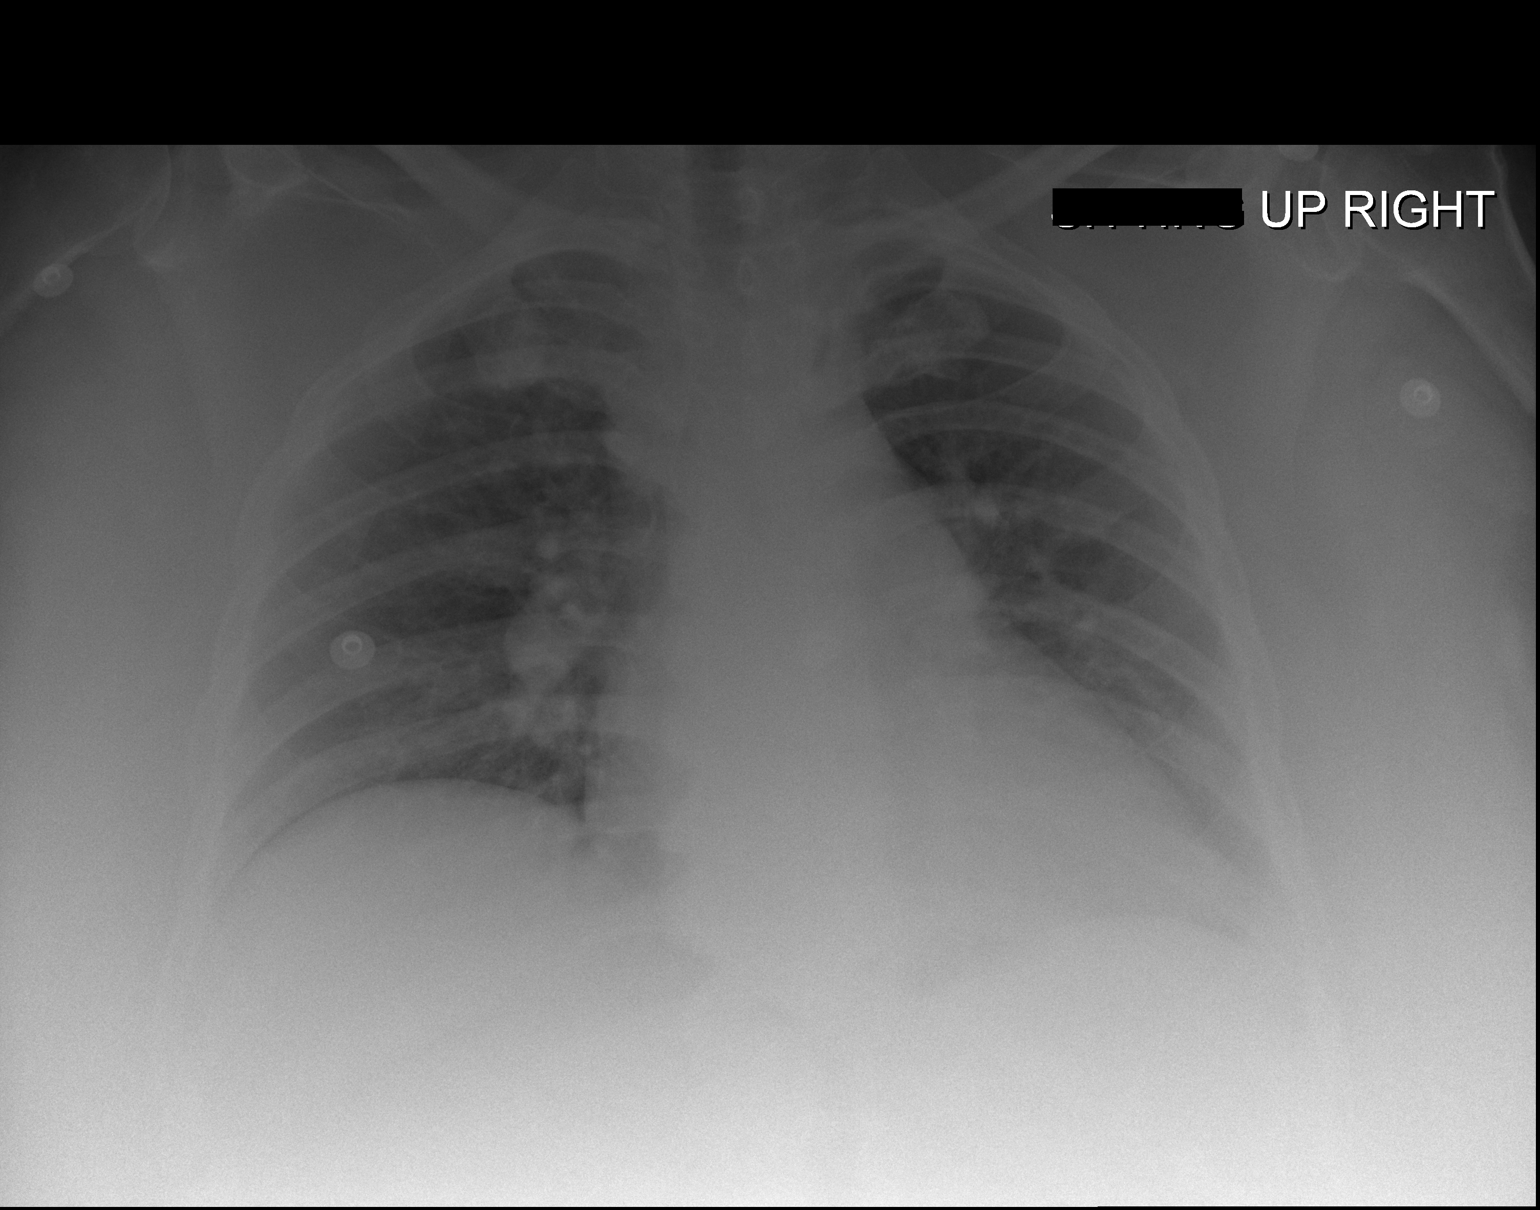

[w chest lat]
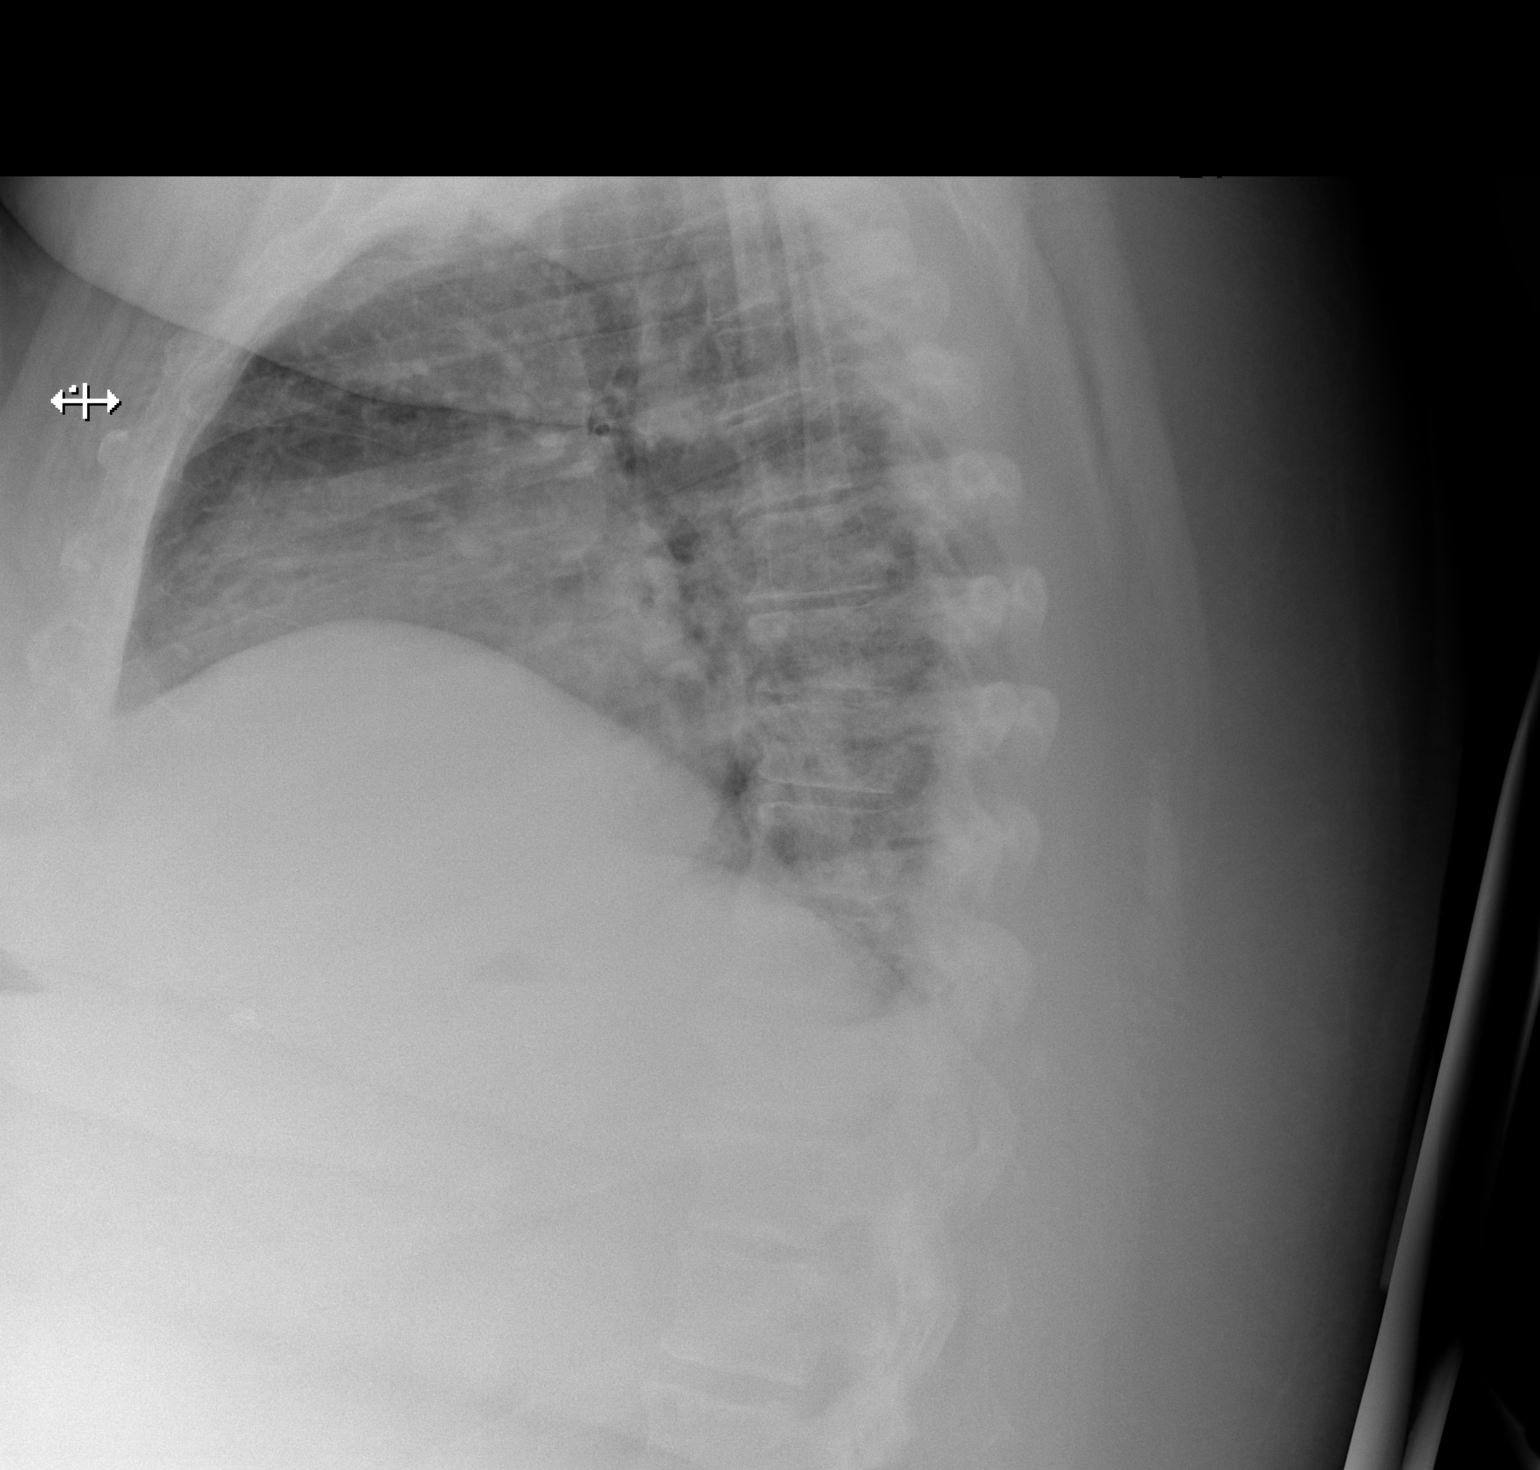

[2 of 2 positions shown; findings below may reference images not displayed]

FINDINGS: Upright AP and lateral views today. Chronically low lung volumes.
Stable mild cardiomegaly. Other mediastinal contours are within
normal limits. Visualized tracheal air column is within normal
limits. No pneumothorax, pulmonary edema, pleural effusion or
confluent pulmonary opacity. Diffuse crowding of lung markings,
atelectasis. No acute osseous abnormality identified. Paucity of
bowel gas in the upper abdomen.
IMPRESSION: Chronic mild cardiomegaly and low lung volumes with atelectasis. No
other acute cardiopulmonary abnormality.

## 2022-06-09 ENCOUNTER — Ambulatory Visit (INDEPENDENT_AMBULATORY_CARE_PROVIDER_SITE_OTHER): Payer: Medicaid Other | Admitting: Podiatry

## 2022-06-09 ENCOUNTER — Ambulatory Visit (INDEPENDENT_AMBULATORY_CARE_PROVIDER_SITE_OTHER): Payer: Medicaid Other

## 2022-06-09 ENCOUNTER — Telehealth: Payer: Self-pay

## 2022-06-09 DIAGNOSIS — T81509A Unspecified complication of foreign body accidentally left in body following unspecified procedure, initial encounter: Secondary | ICD-10-CM | POA: Diagnosis not present

## 2022-06-09 DIAGNOSIS — M216X1 Other acquired deformities of right foot: Secondary | ICD-10-CM

## 2022-06-09 DIAGNOSIS — Z89431 Acquired absence of right foot: Secondary | ICD-10-CM

## 2022-06-09 DIAGNOSIS — M86671 Other chronic osteomyelitis, right ankle and foot: Secondary | ICD-10-CM | POA: Diagnosis not present

## 2022-06-09 NOTE — Progress Notes (Unsigned)
    SUBJECTIVE:   CHIEF COMPLAINT / HPI: Concern for cellulitis  Patient presents with concern for cellulitis in her lower extremities She states***  PERTINENT  PMH / PSH: ***  OBJECTIVE:   LMP  (LMP Unknown)   Physical Exam   ASSESSMENT/PLAN:   No problem-specific Assessment & Plan notes found for this encounter.     Eulis Foster, MD [place]

## 2022-06-09 NOTE — Telephone Encounter (Signed)
Patient calls nurse line with concerns for possible cellulitis of right leg. Patient states that right leg has been swollen, red and warm to the touch since the beginning of June. She denies fever, chest pain or SHOB. Reports intermittent pain.   Patient states she was told that this was gout during hospitalization. Patient states that she was also told during hospitalization that this could be cellulitis. Patient was unsure if she was given medication for this during admission.   Given that patient is still symptomatic, patient scheduled appointment for tomorrow morning.   ED precautions given.   Talbot Grumbling, RN

## 2022-06-09 NOTE — Telephone Encounter (Signed)
Agree with being seen in person to assess. [name], MD

## 2022-06-09 NOTE — Progress Notes (Signed)
Subjective:  Patient ID: [name], female    DOB: Apr 10, 1976,  MRN: [id number]  Chief Complaint  Patient presents with   Routine Post Op    POV #1 DOS 05/21/2022 REVISION AMPUTATION RAY 1ST    DOS: 05/21/2022 Procedure: Right revisional partial first ray amputation.    46 y.o. female returns for post-op check. She states she has neuropathy she does not have any pain.  She said there is no gross swelling no redness noted no acute complaints are noted.  Review of Systems: Negative except as noted in the HPI. Denies N/V/F/Ch.  Past Medical History:  Diagnosis Date   Abscess of groin, left    Acute on chronic diastolic (congestive) heart failure ([place]) 02/05/2022   Acute on chronic respiratory failure with hypoxia ([name]) 04/11/2022   Acute osteomyelitis of ankle or foot, left ([place]) 01/31/2018   Acute respiratory failure with hypoxia and hypercarbia ([place]) 06/15/2020   Alveolar hypoventilation    Amputation stump infection ([place]) 04/09/2018   Anemia    not on iron pill   Asthma    Bipolar 2 disorder ([name])    Candidiasis of vagina 05/15/2022   Carpal tunnel syndrome on right    recurrent   Cellulitis 08/2010-08/2011   Chest pain with low risk for cardiac etiology 05/11/2021   Chronic pain    COPD (chronic obstructive pulmonary disease) ([name])    Symbicort daily and Proventil as needed   Costochondritis    De Quervain's tenosynovitis, bilateral 11/01/2015   Depression    Diabetes mellitus type II, uncontrolled 2000   Type 2, Uncontrolled.Takes Lantus daily.Fasting blood sugar runs 150   Diabetic ulcer of right foot ([place]) 12/19/2021   Drug-seeking behavior    Elevated troponin    Exposure to mold 04/21/2020   GERD (gastroesophageal reflux disease)    takes Pantoprazole and Zantac daily   HLD (hyperlipidemia)    takes Atorvastatin daily   Hypertension    takes Lisinopril and Coreg daily   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[place]) 04/11/2019   Left shoulder pain 06/23/2019   Morbid obesity ([place])     Morbid obesity with BMI of 60.0-69.9, adult ([place]) 04/15/2021   Nocturia    Nonobstructive atherosclerosis of coronary artery 11/26/2020   11/26/20 Coronary angiogram: 1.  Mild diffuse proximal LAD stenosis with no evidence of obstructive disease (20 to 30% diffuse stenosis);  2.  Left dominant circumflex with widely patent obtuse marginal branches and left PDA branch, no significant stenoses present  3.  Nondominant RCA with mild diffuse nonobstructive stenosis    OSA on CPAP    Peripheral neuropathy    takes Gabapentin daily   Pneumonia    "walking" several yrs ago and as a baby (12/05/2018)   Pneumonia due to COVID-19 virus 04/14/2021   Primary osteoarthritis of first carpometacarpal joint of left hand 07/30/2016   Rectal fissure    Restless leg    Right carpal tunnel syndrome 09/01/2011   SVT (supraventricular tachycardia) ([place])    Syncope 02/25/2016   Thrombocytosis 04/11/2022   Urinary frequency    Urinary incontinence 10/23/2020   UTI (urinary tract infection) 04/15/2021   Varicose veins    Right medial thigh and Left leg     Current Outpatient Medications:    Accu-Chek FastClix Lancets MISC, USE DAILY AS INSTRUCTED, Disp: 102 each, Rfl: 3   ACCU-CHEK GUIDE test strip, USE T0 CHECK BLOOD SUGAR 3 TIMES DAILY, Disp: 100 strip, Rfl: 5   Accu-Chek Softclix Lancets lancets, Use to check  blood glucose four times daily, Disp: 100 each, Rfl: 12   acetaminophen (TYLENOL) 325 MG tablet, Take 1-2 tablets (325-650 mg total) by mouth every 4 (four) hours as needed for mild pain., Disp: , Rfl:    albuterol (VENTOLIN HFA) 108 (90 Base) MCG/ACT inhaler, INHALE 1 TO 2 PUFFS BY MOUTH EVERY SIX HOURS AS NEEDED FOR WHEEZING OR SHORTNESS OF BREATH (Patient taking differently: Inhale 1 puff into the lungs every 6 (six) hours as needed for shortness of breath.), Disp: 6.7 g, Rfl: 4   amLODipine (NORVASC) 10 MG tablet, Take 1 tablet (10 mg total) by mouth daily., Disp: 30 tablet, Rfl: 0   aspirin EC 81 MG  tablet, Take 1 tablet (81 mg total) by mouth daily. Swallow whole., Disp: 90 tablet, Rfl: 3   cetirizine (ZYRTEC) 10 MG tablet, Take 1 tablet (10 mg total) by mouth daily as needed for allergies., Disp: 30 tablet, Rfl: 3   Cholecalciferol 1000 units tablet, Take 1 tablet (1,000 Units total) by mouth daily., Disp: 90 tablet, Rfl: 0   Continuous Blood Gluc Sensor (DEXCOM G6 SENSOR) MISC, Inject 1 applicator into the skin as directed. Change sensor every 10 days., Disp: 3 each, Rfl: 11   Continuous Blood Gluc Transmit (DEXCOM G6 TRANSMITTER) MISC, Inject 1 Device into the skin as directed. Reuse 8 times with sensor changes., Disp: 1 each, Rfl: 3   DULoxetine (CYMBALTA) 30 MG capsule, Take 1 capsule (30 mg total) by mouth daily. X 1 week; then 60 mg daily- for knee and back/nerve pain, Disp: 60 capsule, Rfl: 5   gabapentin (NEURONTIN) 600 MG tablet, Take 2 tablets (1,200 mg total) by mouth 2 (two) times daily., Disp: 120 tablet, Rfl: 0   Glucagon (BAQSIMI ONE PACK) 3 MG/DOSE POWD, Place 3 mg into the nose as needed (low blood sugar)., Disp: , Rfl:    Glucagon, rDNA, (GLUCAGON EMERGENCY) 1 MG KIT, Inject 1 mg into the muscle as needed for low blood sugar., Disp: , Rfl:    GNP ULTICARE PEN NEEDLES 32G X 4 MM MISC, USE TO inject insulin 2 TIMES DAILY, Disp: 100 each, Rfl: 2   insulin aspart (NOVOLOG FLEXPEN) 100 UNIT/ML FlexPen, Inject 20 Units into the skin 3 (three) times daily with meals., Disp: 45 mL, Rfl: 3   insulin degludec (TRESIBA FLEXTOUCH) 200 UNIT/ML FlexTouch Pen, Inject 52 Units into the skin 2 (two) times daily., Disp: 30 mL, Rfl: 3   nystatin (MYCOSTATIN/NYSTOP) powder, Apply 1 application. topically 3 (three) times daily., Disp: 30 g, Rfl: 0   ondansetron (ZOFRAN) 4 MG tablet, Take 1 tablet (4 mg total) by mouth every 8 (eight) hours as needed for nausea or vomiting., Disp: 60 tablet, Rfl: 1   oxyCODONE-acetaminophen (PERCOCET) 7.5-325 MG tablet, Take 1 tablet by mouth every 6 (six) hours as  needed for severe pain., Disp: 120 tablet, Rfl: 0   prednisoLONE acetate (PRED FORTE) 1 % ophthalmic suspension, Place 1 drop into both eyes 4 (four) times daily., Disp: 5 mL, Rfl: 0   SYMBICORT 160-4.5 MCG/ACT inhaler, inhale 2 PUFFS into THE lungs 2 TIMES DAILY (Patient taking differently: 2 puffs every 6 (six) hours. Shortness of breath), Disp: 10.2 g, Rfl: 4   Tiotropium Bromide Monohydrate (SPIRIVA RESPIMAT) 1.25 MCG/ACT AERS, Inhale 2 puffs into the lungs daily., Disp: 1 each, Rfl: 0   torsemide 40 MG TABS, Take 40 mg by mouth 2 (two) times daily., Disp: 60 tablet, Rfl: 0   traZODone (DESYREL) 50 MG tablet, Take 0.5-1  tablets (25-50 mg total) by mouth at bedtime as needed for sleep., Disp: 30 tablet, Rfl: 0   TRULICITY 3 LP/2.5ZM SOPN, Inject 3 mg into the skin once a week. Sundays, Disp: , Rfl:   Social History   Tobacco Use  Smoking Status Former   Packs/day: 0.25   Years: 15.00   Total pack years: 3.75   Types: Cigarettes   Quit date: 12/06/2005   Years since quitting: 16.5  Smokeless Tobacco Never  Tobacco Comments   smokes for a couple of months    Allergies  Allergen Reactions   Iodine Other (See Comments)    Kidney dysfunction   Kiwi Extract Shortness Of Breath, Swelling and Anaphylaxis   Morphine And Related Nausea And Vomiting   Nalbuphine Other (See Comments)    Hallucinations     Trental [Pentoxifylline] Nausea And Vomiting   Toradol [Ketorolac Tromethamine] Other (See Comments)    Feels like something is crawling on me   Morphine Nausea And Vomiting   Nubain [Nalbuphine Hcl] Other (See Comments)    "FEELS LIKE SOMETHING CRAWLING ON ME"   Objective:  There were no vitals filed for this visit. There is no height or weight on file to calculate BMI. Constitutional Well developed. Well nourished.  Vascular Foot warm and well perfused. Capillary refill normal to all digits.   Neurologic Normal speech. Oriented to person, place, and time. Epicritic sensation  to light touch grossly present bilaterally.  Dermatologic Skin healing well without signs of infection. Skin edges well coapted without signs of infection.  Orthopedic: Tenderness to palpation noted about the surgical site.   Radiographs: 3 views of skeletally mature the right foot: Right partial first ray amputation site noted.  No signs of foreign body noted.  No signs of Deis infection noted no signs of osteomyelitis noted. Assessment:   1. Postoperative foreign body, initial encounter   2. Status post transmetatarsal amputation of right foot [facility])    Plan:  Patient was evaluated and treated and all questions answered.  S/p foot surgery right -Progressing as expected post-operatively. -XR: See above -WB Status: Nonweightbearing in right lower extremity with a walker -Sutures: Intact.  No signs of Deis is noted no complication noted. -Medications: None -Foot redressed.  No follow-ups on file.

## 2022-06-10 ENCOUNTER — Ambulatory Visit (INDEPENDENT_AMBULATORY_CARE_PROVIDER_SITE_OTHER): Payer: Medicaid Other | Admitting: Family Medicine

## 2022-06-10 VITALS — BP 129/56 | HR 100 | Temp 97.9°F | Wt 344.8 lb

## 2022-06-10 DIAGNOSIS — L03115 Cellulitis of right lower limb: Secondary | ICD-10-CM

## 2022-06-10 MED ORDER — CEPHALEXIN 500 MG PO CAPS: 500.0000 mg | ORAL_CAPSULE | Freq: Four times a day (QID) | ORAL | 0 refills | Status: AC

## 2022-06-10 NOTE — Patient Instructions (Signed)
Also stated that your symptoms are due to a mild cellulitis.  I prescribed an antibiotic, Keflex, for you to take within 8 weeks days.  Please follow-up with our office on July 12 or 13 to reassess your leg and see how you are doing on the treatment.  Please continue noticed worsening pain, redness or swelling of the leg despite taking these medications, recommend that she notify our office so that we can follow-up sooner.

## 2022-06-10 NOTE — Assessment & Plan Note (Signed)
Mild redness and edema noted on exam today  Given patient is high risk, Will treat for mild cellulitis with Keflex for 6 days Patient will follow-up in 1 week Counseled patient to monitor for increasing redness, pain despite taking medication and to follow-up sooner

## 2022-06-11 ENCOUNTER — Other Ambulatory Visit (HOSPITAL_COMMUNITY): Payer: Self-pay

## 2022-06-14 IMAGING — CR DG CHEST 2V
2 series · 2 of 2 positions shown · non-contrast
Comparison: Chest x-ray 04/27/2021.

CLINICAL DATA: 45-year-old female with history of chest pain.

EXAM:
CHEST - 2 VIEW

[chest lat]
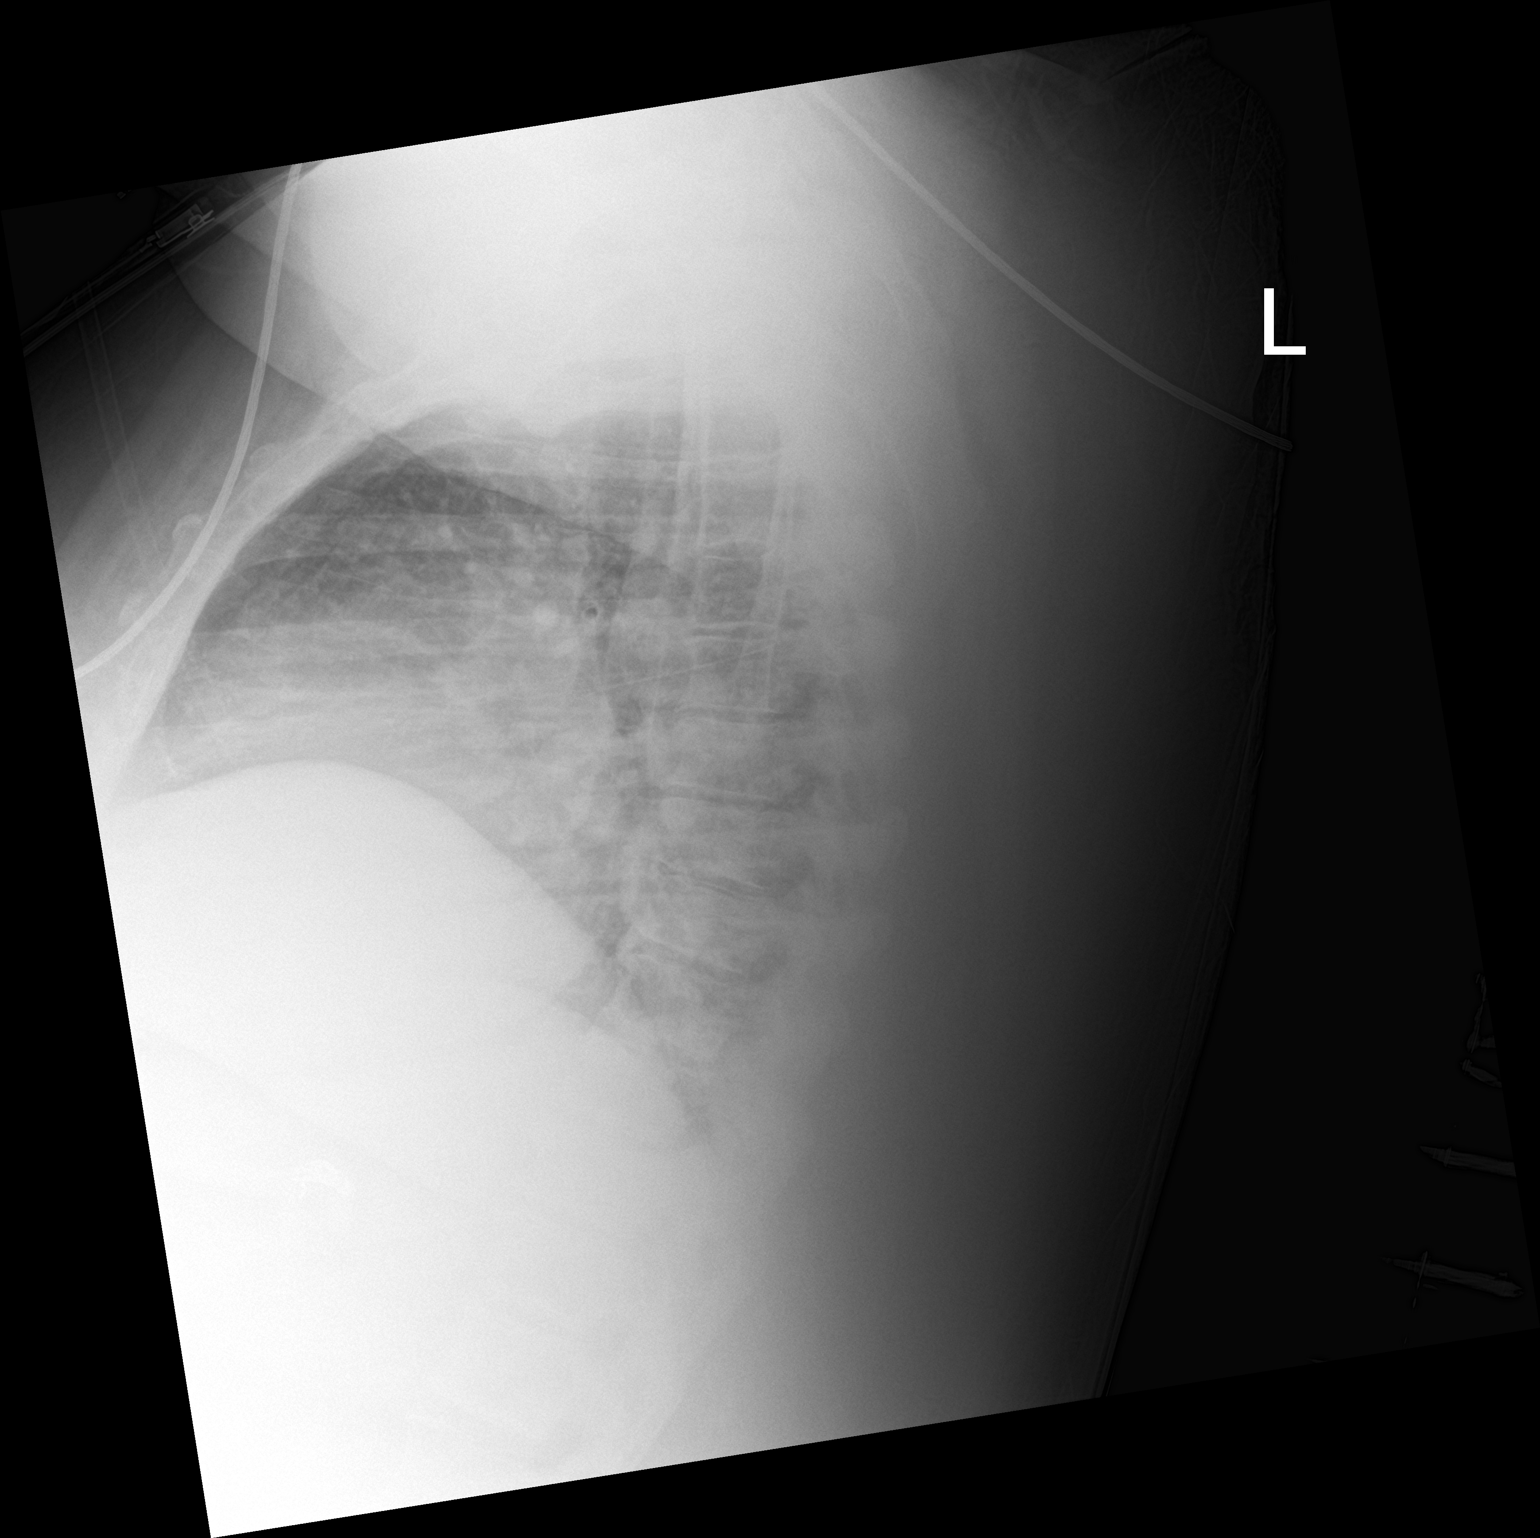

[chest ap]
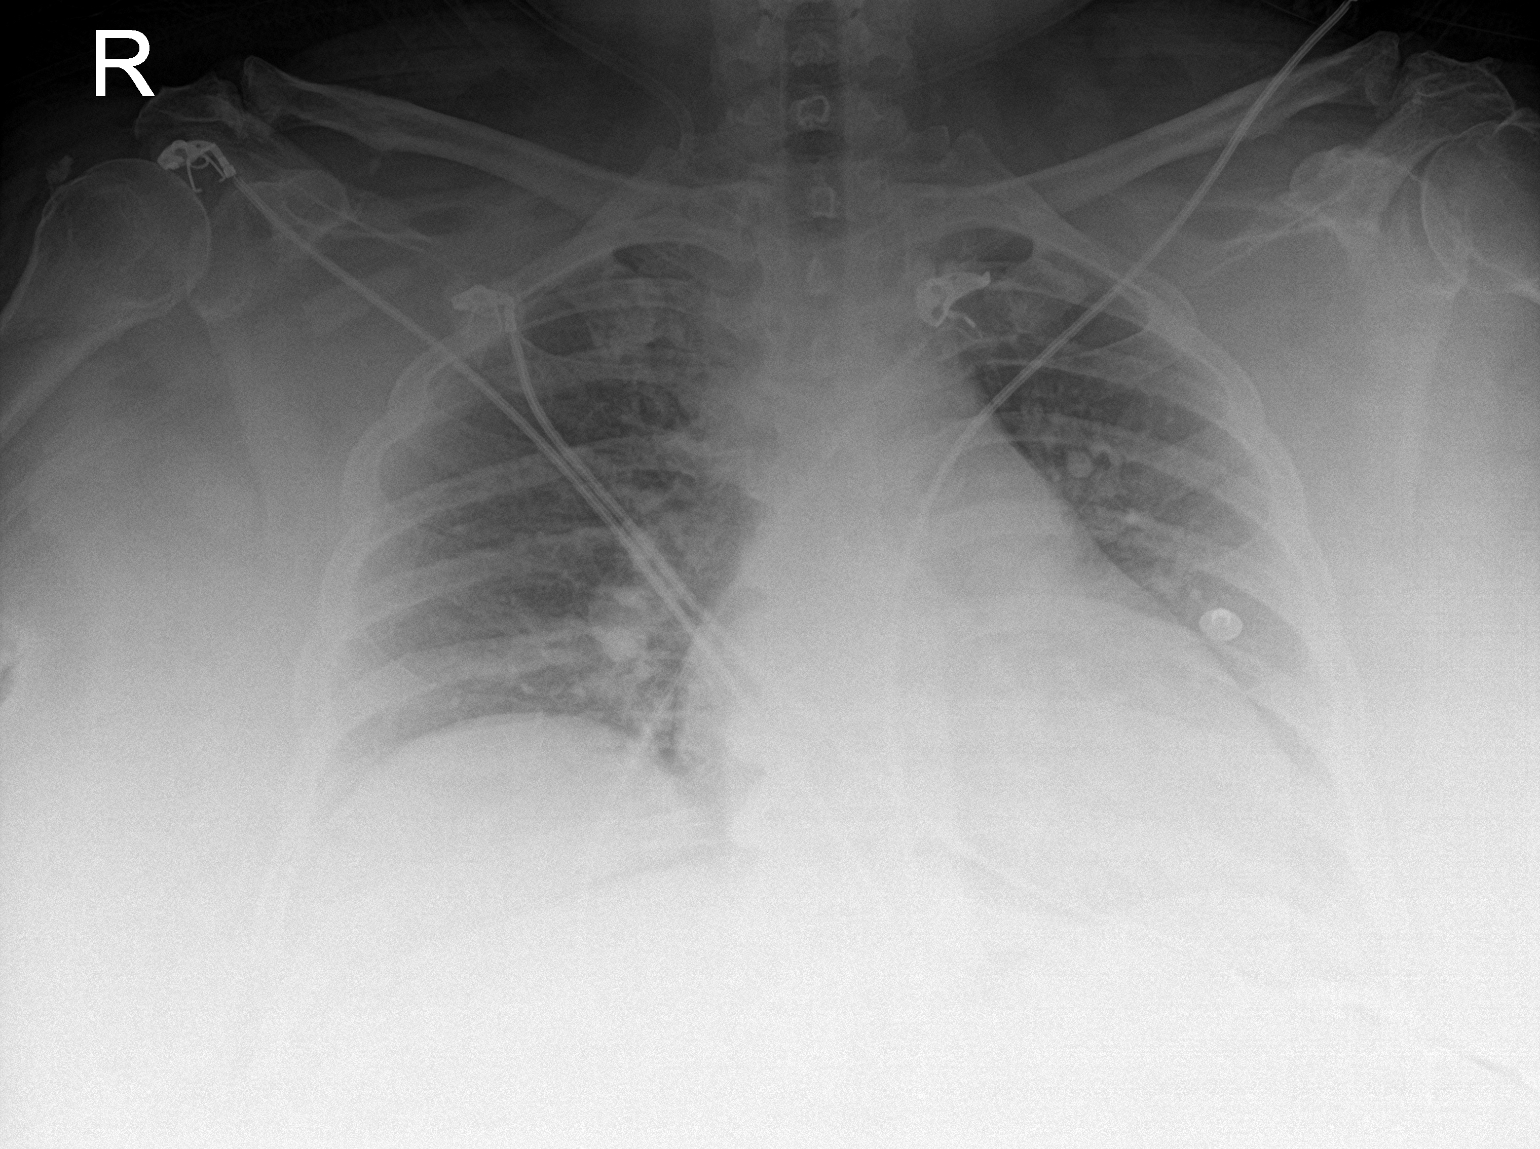

[2 of 2 positions shown; findings below may reference images not displayed]

FINDINGS: Study is limited by patient's large body habitus and under
penetration of the image. With this limitation in mind, lung volumes
are low. No consolidative airspace disease. No pleural effusions. No
pneumothorax. No pulmonary nodule or mass noted. Pulmonary
vasculature and the cardiomediastinal silhouette are within normal
limits.
IMPRESSION: 1. Low lung volumes without radiographic evidence of acute
cardiopulmonary disease.

## 2022-06-15 ENCOUNTER — Other Ambulatory Visit (HOSPITAL_COMMUNITY): Payer: Self-pay

## 2022-06-15 ENCOUNTER — Telehealth: Payer: Self-pay

## 2022-06-15 NOTE — Telephone Encounter (Signed)
A Prior Authorization (possible reauthorization) was initiated for this patients DEXCOM 6G TRANSMITTER through [place].   CONFIRMATION #[id number]

## 2022-06-16 NOTE — Telephone Encounter (Signed)
Prior Auth for patients medication DEXCOM G6 TRANSMITTER denied by MEDICAID via CoverMyMeds.   Reason: none given (pt may need diabetes f/u appt to show blood sugar control/ improvement)

## 2022-06-17 ENCOUNTER — Ambulatory Visit: Payer: Medicaid Other | Admitting: Family Medicine

## 2022-06-21 ENCOUNTER — Inpatient Hospital Stay: Payer: Medicaid Other | Admitting: Family Medicine

## 2022-06-25 ENCOUNTER — Ambulatory Visit (INDEPENDENT_AMBULATORY_CARE_PROVIDER_SITE_OTHER): Payer: Medicaid Other | Admitting: Family Medicine

## 2022-06-25 ENCOUNTER — Telehealth: Payer: Self-pay

## 2022-06-25 VITALS — BP 132/81 | HR 94 | Ht 64.0 in

## 2022-06-25 DIAGNOSIS — I872 Venous insufficiency (chronic) (peripheral): Secondary | ICD-10-CM | POA: Diagnosis present

## 2022-06-25 DIAGNOSIS — R600 Localized edema: Secondary | ICD-10-CM | POA: Insufficient documentation

## 2022-06-25 MED ORDER — TRIAMCINOLONE ACETONIDE 0.5 % EX OINT: 1.0000 | TOPICAL_OINTMENT | Freq: Two times a day (BID) | CUTANEOUS | 1 refills | Status: DC

## 2022-06-25 NOTE — Progress Notes (Cosign Needed Addendum)
    SUBJECTIVE:   CHIEF COMPLAINT / HPI:   Patient presents for follow up for cellulitis, this has been an ongoing since 05/14/2022 when she was in the hospital. She then came to the clinic for a follow up and was placed on keflex, she completed this antibiotic course but feels that it did not help. It is still swelling, red and occasionally aches. Reports elevating the at night. Denies fever, chills, nausea, vomiting, sick contacts or other symptoms. Denies chest pain or dyspnea. They did imaging while she was in the hospital when this condition first developed, imaging demonstrated signs of chronic osteomyelitis but no acute changes.   OBJECTIVE:   BP 132/81   Pulse 94   Ht '5\' 4"'$  (1.626 m)   LMP 05/06/2022 (Approximate)   SpO2 96%   BMI 59.18 kg/m   General: Patient well-appearing, in no acute distress. CV: RRR, no murmurs or gallops auscultated Resp: CTAB, no wheezing, rales or rhonchi noted Ext: very mild erythema noted of right lower extremity with mild tenderness below right knee, no calf tenderness, non-pitting edema noted along right LE Psych: mood appropriate   ASSESSMENT/PLAN:   Bilateral lower extremity edema -seem to be chronic changes, possibly resembling venous statis changes causing dermatitis. Reassuringly no PE or DVT. Cardiovascular exam unremarkable, not consistent with heart failure. No signs of cellulitis or other infection. Explained this to patient, encouraged elevation and compression. -prescribed triamcinolone for inflammation and erythema -patient agrees with current plan of care, all questions answered -follow up in 2 weeks with PCP to monitor progression      Kristian Mogg Larae Grooms, [place]

## 2022-06-25 NOTE — Telephone Encounter (Signed)
[place] called:   Rachel Moulds PT is seeking a verbal approval for in-home physical therapy once a week for 3 weeks. To work on safe transfers.  Call back phone [phone].  Verbal okay given with a reminder to keep follow up appointments.

## 2022-06-25 NOTE — Assessment & Plan Note (Addendum)
-  seem to be chronic changes, possibly resembling venous statis changes causing dermatitis. Reassuringly no PE or DVT. Cardiovascular exam unremarkable, not consistent with heart failure. No signs of cellulitis or other infection. Explained this to patient, encouraged elevation and compression. -prescribed triamcinolone for inflammation and erythema -patient agrees with current plan of care, all questions answered -follow up in 2 weeks with PCP to monitor progression

## 2022-06-25 NOTE — Patient Instructions (Addendum)
It was great seeing you today!  Today we discussed your leg, I am sorry that you are still having symptoms. Although there seems to be no active infection and this seems to be an ongoing issue. This is due to venous changes which are causing these skin changes. You do not need an antibiotic at this time. I have prescribed a steroid cream, please apply this to the affected skin as it can help with inflammation and redness.   Please continue to elevate the leg and add compression during the day.   Please follow up at your next scheduled appointment in 2 weeks, if anything arises between now and then, please don't hesitate to contact our office.   Thank you for allowing [place] to be a part of your medical care!  Thank you, Dr. Larae Grooms

## 2022-07-05 ENCOUNTER — Emergency Department (HOSPITAL_COMMUNITY): Payer: Medicaid Other

## 2022-07-05 ENCOUNTER — Encounter (HOSPITAL_COMMUNITY): Payer: Self-pay

## 2022-07-05 ENCOUNTER — Inpatient Hospital Stay (HOSPITAL_COMMUNITY): Payer: Medicaid Other

## 2022-07-05 ENCOUNTER — Telehealth: Payer: Self-pay | Admitting: Family Medicine

## 2022-07-05 ENCOUNTER — Ambulatory Visit: Payer: Medicaid Other | Admitting: Family Medicine

## 2022-07-05 ENCOUNTER — Inpatient Hospital Stay (HOSPITAL_COMMUNITY): Payer: Medicaid Other | Admitting: Anesthesiology

## 2022-07-05 ENCOUNTER — Other Ambulatory Visit: Payer: Self-pay

## 2022-07-05 ENCOUNTER — Inpatient Hospital Stay (HOSPITAL_COMMUNITY)
Admission: EM | Admit: 2022-07-05 | Discharge: 2022-07-13 | DRG: 919 | Discharge status: Against Medical Advice | Payer: Medicaid Other | Attending: Family Medicine | Admitting: Family Medicine

## 2022-07-05 DIAGNOSIS — T884XXA Failed or difficult intubation, initial encounter: Secondary | ICD-10-CM | POA: Diagnosis not present

## 2022-07-05 DIAGNOSIS — E1159 Type 2 diabetes mellitus with other circulatory complications: Principal | ICD-10-CM

## 2022-07-05 DIAGNOSIS — Z6841 Body Mass Index (BMI) 40.0 and over, adult: Secondary | ICD-10-CM | POA: Diagnosis not present

## 2022-07-05 DIAGNOSIS — I5030 Unspecified diastolic (congestive) heart failure: Secondary | ICD-10-CM | POA: Diagnosis not present

## 2022-07-05 DIAGNOSIS — Z794 Long term (current) use of insulin: Secondary | ICD-10-CM | POA: Diagnosis not present

## 2022-07-05 DIAGNOSIS — Z89511 Acquired absence of right leg below knee: Secondary | ICD-10-CM

## 2022-07-05 DIAGNOSIS — Z7982 Long term (current) use of aspirin: Secondary | ICD-10-CM

## 2022-07-05 DIAGNOSIS — Z8249 Family history of ischemic heart disease and other diseases of the circulatory system: Secondary | ICD-10-CM

## 2022-07-05 DIAGNOSIS — E11319 Type 2 diabetes mellitus with unspecified diabetic retinopathy without macular edema: Secondary | ICD-10-CM | POA: Diagnosis present

## 2022-07-05 DIAGNOSIS — G4730 Sleep apnea, unspecified: Secondary | ICD-10-CM | POA: Diagnosis not present

## 2022-07-05 DIAGNOSIS — Z5329 Procedure and treatment not carried out because of patient's decision for other reasons: Secondary | ICD-10-CM | POA: Diagnosis present

## 2022-07-05 DIAGNOSIS — E662 Morbid (severe) obesity with alveolar hypoventilation: Secondary | ICD-10-CM | POA: Diagnosis present

## 2022-07-05 DIAGNOSIS — M1812 Unilateral primary osteoarthritis of first carpometacarpal joint, left hand: Secondary | ICD-10-CM | POA: Diagnosis present

## 2022-07-05 DIAGNOSIS — J449 Chronic obstructive pulmonary disease, unspecified: Secondary | ICD-10-CM | POA: Diagnosis present

## 2022-07-05 DIAGNOSIS — K30 Functional dyspepsia: Secondary | ICD-10-CM

## 2022-07-05 DIAGNOSIS — K122 Cellulitis and abscess of mouth: Secondary | ICD-10-CM

## 2022-07-05 DIAGNOSIS — Z89431 Acquired absence of right foot: Secondary | ICD-10-CM | POA: Diagnosis not present

## 2022-07-05 DIAGNOSIS — Z87891 Personal history of nicotine dependence: Secondary | ICD-10-CM | POA: Diagnosis not present

## 2022-07-05 DIAGNOSIS — F3181 Bipolar II disorder: Secondary | ICD-10-CM | POA: Diagnosis present

## 2022-07-05 DIAGNOSIS — E785 Hyperlipidemia, unspecified: Secondary | ICD-10-CM | POA: Diagnosis present

## 2022-07-05 DIAGNOSIS — Z885 Allergy status to narcotic agent status: Secondary | ICD-10-CM

## 2022-07-05 DIAGNOSIS — E1122 Type 2 diabetes mellitus with diabetic chronic kidney disease: Secondary | ICD-10-CM | POA: Diagnosis present

## 2022-07-05 DIAGNOSIS — E875 Hyperkalemia: Secondary | ICD-10-CM | POA: Diagnosis not present

## 2022-07-05 DIAGNOSIS — Z825 Family history of asthma and other chronic lower respiratory diseases: Secondary | ICD-10-CM

## 2022-07-05 DIAGNOSIS — N170 Acute kidney failure with tubular necrosis: Secondary | ICD-10-CM | POA: Diagnosis present

## 2022-07-05 DIAGNOSIS — G8929 Other chronic pain: Secondary | ICD-10-CM | POA: Diagnosis not present

## 2022-07-05 DIAGNOSIS — Z8616 Personal history of COVID-19: Secondary | ICD-10-CM | POA: Diagnosis not present

## 2022-07-05 DIAGNOSIS — L089 Local infection of the skin and subcutaneous tissue, unspecified: Secondary | ICD-10-CM

## 2022-07-05 DIAGNOSIS — E1140 Type 2 diabetes mellitus with diabetic neuropathy, unspecified: Secondary | ICD-10-CM

## 2022-07-05 DIAGNOSIS — J9622 Acute and chronic respiratory failure with hypercapnia: Secondary | ICD-10-CM | POA: Diagnosis present

## 2022-07-05 DIAGNOSIS — I959 Hypotension, unspecified: Secondary | ICD-10-CM | POA: Diagnosis not present

## 2022-07-05 DIAGNOSIS — E870 Hyperosmolality and hypernatremia: Secondary | ICD-10-CM | POA: Diagnosis not present

## 2022-07-05 DIAGNOSIS — R34 Anuria and oliguria: Secondary | ICD-10-CM | POA: Diagnosis not present

## 2022-07-05 DIAGNOSIS — E11649 Type 2 diabetes mellitus with hypoglycemia without coma: Secondary | ICD-10-CM | POA: Diagnosis not present

## 2022-07-05 DIAGNOSIS — Z7985 Long-term (current) use of injectable non-insulin antidiabetic drugs: Secondary | ICD-10-CM

## 2022-07-05 DIAGNOSIS — Z833 Family history of diabetes mellitus: Secondary | ICD-10-CM

## 2022-07-05 DIAGNOSIS — J9601 Acute respiratory failure with hypoxia: Secondary | ICD-10-CM

## 2022-07-05 DIAGNOSIS — Y703 Surgical instruments, materials and anesthesiology devices (including sutures) associated with adverse incidents: Secondary | ICD-10-CM | POA: Diagnosis present

## 2022-07-05 DIAGNOSIS — K5900 Constipation, unspecified: Secondary | ICD-10-CM | POA: Diagnosis not present

## 2022-07-05 DIAGNOSIS — E1142 Type 2 diabetes mellitus with diabetic polyneuropathy: Secondary | ICD-10-CM | POA: Diagnosis present

## 2022-07-05 DIAGNOSIS — J9621 Acute and chronic respiratory failure with hypoxia: Secondary | ICD-10-CM | POA: Diagnosis present

## 2022-07-05 DIAGNOSIS — R079 Chest pain, unspecified: Secondary | ICD-10-CM | POA: Diagnosis present

## 2022-07-05 DIAGNOSIS — Z9889 Other specified postprocedural states: Secondary | ICD-10-CM

## 2022-07-05 DIAGNOSIS — I2510 Atherosclerotic heart disease of native coronary artery without angina pectoris: Secondary | ICD-10-CM | POA: Diagnosis present

## 2022-07-05 DIAGNOSIS — T8189XA Other complications of procedures, not elsewhere classified, initial encounter: Principal | ICD-10-CM | POA: Diagnosis present

## 2022-07-05 DIAGNOSIS — Z818 Family history of other mental and behavioral disorders: Secondary | ICD-10-CM

## 2022-07-05 DIAGNOSIS — I130 Hypertensive heart and chronic kidney disease with heart failure and stage 1 through stage 4 chronic kidney disease, or unspecified chronic kidney disease: Secondary | ICD-10-CM | POA: Diagnosis present

## 2022-07-05 DIAGNOSIS — J391 Other abscess of pharynx: Secondary | ICD-10-CM

## 2022-07-05 DIAGNOSIS — M25561 Pain in right knee: Secondary | ICD-10-CM | POA: Diagnosis not present

## 2022-07-05 DIAGNOSIS — I5032 Chronic diastolic (congestive) heart failure: Secondary | ICD-10-CM | POA: Diagnosis present

## 2022-07-05 DIAGNOSIS — N1832 Chronic kidney disease, stage 3b: Secondary | ICD-10-CM | POA: Diagnosis present

## 2022-07-05 DIAGNOSIS — G2581 Restless legs syndrome: Secondary | ICD-10-CM | POA: Diagnosis present

## 2022-07-05 DIAGNOSIS — E119 Type 2 diabetes mellitus without complications: Secondary | ICD-10-CM

## 2022-07-05 DIAGNOSIS — L03221 Cellulitis of neck: Secondary | ICD-10-CM

## 2022-07-05 DIAGNOSIS — E1165 Type 2 diabetes mellitus with hyperglycemia: Secondary | ICD-10-CM | POA: Diagnosis not present

## 2022-07-05 DIAGNOSIS — K219 Gastro-esophageal reflux disease without esophagitis: Secondary | ICD-10-CM | POA: Diagnosis present

## 2022-07-05 DIAGNOSIS — K1120 Sialoadenitis, unspecified: Secondary | ICD-10-CM | POA: Diagnosis present

## 2022-07-05 DIAGNOSIS — E1169 Type 2 diabetes mellitus with other specified complication: Secondary | ICD-10-CM

## 2022-07-05 DIAGNOSIS — Z89512 Acquired absence of left leg below knee: Secondary | ICD-10-CM

## 2022-07-05 DIAGNOSIS — Z888 Allergy status to other drugs, medicaments and biological substances status: Secondary | ICD-10-CM

## 2022-07-05 DIAGNOSIS — M25562 Pain in left knee: Secondary | ICD-10-CM | POA: Diagnosis not present

## 2022-07-05 DIAGNOSIS — M86671 Other chronic osteomyelitis, right ankle and foot: Secondary | ICD-10-CM | POA: Diagnosis not present

## 2022-07-05 DIAGNOSIS — Z79899 Other long term (current) drug therapy: Secondary | ICD-10-CM

## 2022-07-05 HISTORY — DX: Cellulitis and abscess of mouth: K12.2

## 2022-07-05 LAB — I-STAT ARTERIAL BLOOD GAS, ED
Acid-Base Excess: 6 mmol/L — ABNORMAL HIGH (ref 0.0–2.0)
Bicarbonate: 32.5 mmol/L — ABNORMAL HIGH (ref 20.0–28.0)
Calcium, Ion: 1.14 mmol/L — ABNORMAL LOW (ref 1.15–1.40)
HCT: 35 % — ABNORMAL LOW (ref 36.0–46.0)
Hemoglobin: 11.9 g/dL — ABNORMAL LOW (ref 12.0–15.0)
O2 Saturation: 100 %
Patient temperature: 98.3
Potassium: 4.7 mmol/L (ref 3.5–5.1)
Sodium: 135 mmol/L (ref 135–145)
TCO2: 34 mmol/L — ABNORMAL HIGH (ref 22–32)
pCO2 arterial: 55.9 mmHg — ABNORMAL HIGH (ref 32–48)
pH, Arterial: 7.371 (ref 7.35–7.45)
pO2, Arterial: 369 mmHg — ABNORMAL HIGH (ref 83–108)

## 2022-07-05 LAB — COMPREHENSIVE METABOLIC PANEL
ALT: 13 U/L (ref 0–44)
AST: 8 U/L — ABNORMAL LOW (ref 15–41)
Albumin: 2.8 g/dL — ABNORMAL LOW (ref 3.5–5.0)
Alkaline Phosphatase: 113 U/L (ref 38–126)
Anion gap: 10 (ref 5–15)
BUN: 44 mg/dL — ABNORMAL HIGH (ref 6–20)
CO2: 26 mmol/L (ref 22–32)
Calcium: 8.5 mg/dL — ABNORMAL LOW (ref 8.9–10.3)
Chloride: 98 mmol/L (ref 98–111)
Creatinine, Ser: 1.53 mg/dL — ABNORMAL HIGH (ref 0.44–1.00)
GFR, Estimated: 43 mL/min — ABNORMAL LOW (ref >60)
Glucose, Bld: 322 mg/dL — ABNORMAL HIGH (ref 70–99)
Potassium: 4.7 mmol/L (ref 3.5–5.1)
Sodium: 134 mmol/L — ABNORMAL LOW (ref 135–145)
Total Bilirubin: 1.1 mg/dL (ref 0.3–1.2)
Total Protein: 7.9 g/dL (ref 6.5–8.1)

## 2022-07-05 LAB — CBC WITH DIFFERENTIAL/PLATELET
Abs Immature Granulocytes: 0.12 10*3/uL — ABNORMAL HIGH (ref 0.00–0.07)
Basophils Absolute: 0.1 10*3/uL (ref 0.0–0.1)
Basophils Relative: 0 %
Eosinophils Absolute: 0 10*3/uL (ref 0.0–0.5)
Eosinophils Relative: 0 %
HCT: 32.7 % — ABNORMAL LOW (ref 36.0–46.0)
Hemoglobin: 10.4 g/dL — ABNORMAL LOW (ref 12.0–15.0)
Immature Granulocytes: 1 %
Lymphocytes Relative: 17 %
Lymphs Abs: 2.6 10*3/uL (ref 0.7–4.0)
MCH: 29.4 pg (ref 26.0–34.0)
MCHC: 31.8 g/dL (ref 30.0–36.0)
MCV: 92.4 fL (ref 80.0–100.0)
Monocytes Absolute: 1.3 10*3/uL — ABNORMAL HIGH (ref 0.1–1.0)
Monocytes Relative: 8 %
Neutro Abs: 11.8 10*3/uL — ABNORMAL HIGH (ref 1.7–7.7)
Neutrophils Relative %: 74 %
Platelets: 350 10*3/uL (ref 150–400)
RBC: 3.54 MIL/uL — ABNORMAL LOW (ref 3.87–5.11)
RDW: 14.6 % (ref 11.5–15.5)
WBC: 16 10*3/uL — ABNORMAL HIGH (ref 4.0–10.5)
nRBC: 0 % (ref 0.0–0.2)

## 2022-07-05 LAB — CBC
HCT: 36.3 % (ref 36.0–46.0)
Hemoglobin: 11.6 g/dL — ABNORMAL LOW (ref 12.0–15.0)
MCH: 29.7 pg (ref 26.0–34.0)
MCHC: 32 g/dL (ref 30.0–36.0)
MCV: 93.1 fL (ref 80.0–100.0)
Platelets: 358 10*3/uL (ref 150–400)
RBC: 3.9 MIL/uL (ref 3.87–5.11)
RDW: 14.9 % (ref 11.5–15.5)
WBC: 15.2 10*3/uL — ABNORMAL HIGH (ref 4.0–10.5)
nRBC: 0 % (ref 0.0–0.2)

## 2022-07-05 LAB — TROPONIN I (HIGH SENSITIVITY): Troponin I (High Sensitivity): 18 ng/L — ABNORMAL HIGH (ref <18)

## 2022-07-05 LAB — GLUCOSE, CAPILLARY
Glucose-Capillary: 307 mg/dL — ABNORMAL HIGH (ref 70–99)
Glucose-Capillary: 250 mg/dL — ABNORMAL HIGH (ref 70–99)

## 2022-07-05 LAB — BASIC METABOLIC PANEL
Anion gap: 12 (ref 5–15)
BUN: 47 mg/dL — ABNORMAL HIGH (ref 6–20)
CO2: 27 mmol/L (ref 22–32)
Calcium: 8.7 mg/dL — ABNORMAL LOW (ref 8.9–10.3)
Chloride: 95 mmol/L — ABNORMAL LOW (ref 98–111)
Creatinine, Ser: 1.15 mg/dL — ABNORMAL HIGH (ref 0.44–1.00)
GFR, Estimated: >60 mL/min (ref >60)
Glucose, Bld: 312 mg/dL — ABNORMAL HIGH (ref 70–99)
Potassium: 4.9 mmol/L (ref 3.5–5.1)
Sodium: 134 mmol/L — ABNORMAL LOW (ref 135–145)

## 2022-07-05 LAB — I-STAT CHEM 8, ED
BUN: 45 mg/dL — ABNORMAL HIGH (ref 6–20)
Calcium, Ion: 1.08 mmol/L — ABNORMAL LOW (ref 1.15–1.40)
Chloride: 96 mmol/L — ABNORMAL LOW (ref 98–111)
Creatinine, Ser: 1.3 mg/dL — ABNORMAL HIGH (ref 0.44–1.00)
Glucose, Bld: 319 mg/dL — ABNORMAL HIGH (ref 70–99)
HCT: 37 % (ref 36.0–46.0)
Hemoglobin: 12.6 g/dL (ref 12.0–15.0)
Potassium: 4.4 mmol/L (ref 3.5–5.1)
Sodium: 134 mmol/L — ABNORMAL LOW (ref 135–145)
TCO2: 30 mmol/L (ref 22–32)

## 2022-07-05 LAB — CBG MONITORING, ED: Glucose-Capillary: 295 mg/dL — ABNORMAL HIGH (ref 70–99)

## 2022-07-05 LAB — LACTIC ACID, PLASMA
Lactic Acid, Venous: 1.2 mmol/L (ref 0.5–1.9)
Lactic Acid, Venous: 1 mmol/L (ref 0.5–1.9)

## 2022-07-05 LAB — MRSA NEXT GEN BY PCR, NASAL: MRSA by PCR Next Gen: NOT DETECTED

## 2022-07-05 LAB — MAGNESIUM: Magnesium: 2.2 mg/dL (ref 1.7–2.4)

## 2022-07-05 LAB — PHOSPHORUS: Phosphorus: 4.5 mg/dL (ref 2.5–4.6)

## 2022-07-05 MED ORDER — ARFORMOTEROL TARTRATE 15 MCG/2ML IN NEBU
15.0000 ug | INHALATION_SOLUTION | Freq: Two times a day (BID) | RESPIRATORY_TRACT | Status: DC
  Administered 2022-07-05 – 2022-07-13 (×16): 15 ug via RESPIRATORY_TRACT
  Filled 2022-07-05 (×17): qty 2

## 2022-07-05 MED ORDER — KETAMINE HCL 50 MG/5ML IJ SOSY
PREFILLED_SYRINGE | INTRAMUSCULAR | Status: AC
  Filled 2022-07-05: qty 5

## 2022-07-05 MED ORDER — ORAL CARE MOUTH RINSE
15.0000 mL | OROMUCOSAL | Status: DC
  Administered 2022-07-05 – 2022-07-10 (×52): 15 mL via OROMUCOSAL

## 2022-07-05 MED ORDER — KETAMINE HCL 10 MG/ML IJ SOLN
INTRAMUSCULAR | Status: AC | PRN
  Administered 2022-07-05: 50 mg via INTRAVENOUS

## 2022-07-05 MED ORDER — ORAL CARE MOUTH RINSE: 15.0000 mL | OROMUCOSAL | Status: DC | PRN

## 2022-07-05 MED ORDER — SODIUM CHLORIDE 0.9 % IV SOLN
3.0000 g | Freq: Four times a day (QID) | INTRAVENOUS | Status: DC
  Administered 2022-07-06 – 2022-07-07 (×6): 3 g via INTRAVENOUS
  Filled 2022-07-05 (×7): qty 8

## 2022-07-05 MED ORDER — DOCUSATE SODIUM 50 MG/5ML PO LIQD
100.0000 mg | Freq: Two times a day (BID) | ORAL | Status: DC
  Administered 2022-07-06 – 2022-07-12 (×10): 100 mg
  Filled 2022-07-05 (×11): qty 10

## 2022-07-05 MED ORDER — ALBUTEROL SULFATE (2.5 MG/3ML) 0.083% IN NEBU
2.5000 mg | INHALATION_SOLUTION | RESPIRATORY_TRACT | Status: DC | PRN
Start: 2022-07-05 — End: 2022-07-14

## 2022-07-05 MED ORDER — PROPOFOL 10 MG/ML IV BOLUS
INTRAVENOUS | Status: AC | PRN
  Administered 2022-07-05: 60 mg via INTRAVENOUS
  Administered 2022-07-05: 100 mg via INTRAVENOUS
  Administered 2022-07-05: 60 mg via INTRAVENOUS
  Administered 2022-07-05: 20 ug via INTRAVENOUS
  Administered 2022-07-05: 40 mg via INTRAVENOUS

## 2022-07-05 MED ORDER — SODIUM CHLORIDE 0.9 % IV SOLN
2.0000 g | Freq: Once | INTRAVENOUS | Status: DC
  Filled 2022-07-05: qty 12.5

## 2022-07-05 MED ORDER — CHLORHEXIDINE GLUCONATE CLOTH 2 % EX PADS
6.0000 | MEDICATED_PAD | Freq: Every day | CUTANEOUS | Status: DC
  Administered 2022-07-06 – 2022-07-12 (×8): 6 via TOPICAL

## 2022-07-05 MED ORDER — IPRATROPIUM-ALBUTEROL 0.5-2.5 (3) MG/3ML IN SOLN
3.0000 mL | Freq: Two times a day (BID) | RESPIRATORY_TRACT | Status: DC
  Administered 2022-07-05 – 2022-07-13 (×16): 3 mL via RESPIRATORY_TRACT
  Filled 2022-07-05 (×17): qty 3

## 2022-07-05 MED ORDER — HYDROMORPHONE HCL 1 MG/ML IJ SOLN
0.5000 mg | Freq: Once | INTRAMUSCULAR | Status: AC
  Administered 2022-07-05: 0.5 mg via INTRAVENOUS
  Filled 2022-07-05: qty 1

## 2022-07-05 MED ORDER — LIDOCAINE HCL (CARDIAC) PF 100 MG/5ML IV SOSY
PREFILLED_SYRINGE | INTRAVENOUS | Status: AC | PRN
  Administered 2022-07-05: 100 mg via INTRAVENOUS

## 2022-07-05 MED ORDER — FENTANYL CITRATE PF 50 MCG/ML IJ SOSY
50.0000 ug | PREFILLED_SYRINGE | INTRAMUSCULAR | Status: DC | PRN
  Administered 2022-07-05 (×2): 100 ug via INTRAVENOUS
  Administered 2022-07-06: 150 ug via INTRAVENOUS
  Administered 2022-07-06: 100 ug via INTRAVENOUS
  Administered 2022-07-06 – 2022-07-07 (×3): 50 ug via INTRAVENOUS
  Administered 2022-07-08: 100 ug via INTRAVENOUS
  Administered 2022-07-08: 50 ug via INTRAVENOUS
  Filled 2022-07-05: qty 2

## 2022-07-05 MED ORDER — LIDOCAINE HCL (CARDIAC) PF 100 MG/5ML IV SOSY
PREFILLED_SYRINGE | INTRAVENOUS | Status: DC | PRN
  Administered 2022-07-05: 100 mg via INTRAVENOUS

## 2022-07-05 MED ORDER — POLYETHYLENE GLYCOL 3350 17 G PO PACK
17.0000 g | PACK | Freq: Every day | ORAL | Status: DC
  Administered 2022-07-07 – 2022-07-08 (×2): 17 g
  Filled 2022-07-05 (×4): qty 1

## 2022-07-05 MED ORDER — PROPOFOL 1000 MG/100ML IV EMUL
0.0000 ug/kg/min | INTRAVENOUS | Status: DC
  Administered 2022-07-05 – 2022-07-06 (×4): 50 ug/kg/min via INTRAVENOUS
  Administered 2022-07-06: 30 ug/kg/min via INTRAVENOUS
  Administered 2022-07-06: 50 ug/kg/min via INTRAVENOUS
  Filled 2022-07-05 (×3): qty 200
  Filled 2022-07-05 (×2): qty 100

## 2022-07-05 MED ORDER — SODIUM CHLORIDE (PF) 0.9 % IJ SOLN
INTRAMUSCULAR | Status: AC
  Filled 2022-07-05: qty 50

## 2022-07-05 MED ORDER — SODIUM CHLORIDE 0.9 % IV SOLN
3.0000 g | Freq: Once | INTRAVENOUS | Status: AC
  Administered 2022-07-05: 3 g via INTRAVENOUS
  Filled 2022-07-05: qty 8

## 2022-07-05 MED ORDER — VANCOMYCIN HCL IN DEXTROSE 1-5 GM/200ML-% IV SOLN
1000.0000 mg | Freq: Once | INTRAVENOUS | Status: AC
  Administered 2022-07-05: 1000 mg via INTRAVENOUS
  Filled 2022-07-05: qty 200

## 2022-07-05 MED ORDER — ONDANSETRON HCL 4 MG/2ML IJ SOLN
4.0000 mg | Freq: Once | INTRAMUSCULAR | Status: AC
  Administered 2022-07-05: 4 mg via INTRAVENOUS
  Filled 2022-07-05: qty 2

## 2022-07-05 MED ORDER — HEPARIN SODIUM (PORCINE) 5000 UNIT/ML IJ SOLN
5000.0000 [IU] | Freq: Three times a day (TID) | INTRAMUSCULAR | Status: DC
Start: 2022-07-05 — End: 2022-07-14
  Administered 2022-07-05 – 2022-07-13 (×25): 5000 [IU] via SUBCUTANEOUS
  Filled 2022-07-05 (×26): qty 1

## 2022-07-05 MED ORDER — IOHEXOL 300 MG/ML  SOLN
75.0000 mL | Freq: Once | INTRAMUSCULAR | Status: AC | PRN
  Administered 2022-07-05: 75 mL via INTRAVENOUS

## 2022-07-05 MED ORDER — SODIUM CHLORIDE 0.9 % IV BOLUS
1000.0000 mL | Freq: Once | INTRAVENOUS | Status: AC
  Administered 2022-07-05: 1000 mL via INTRAVENOUS

## 2022-07-05 MED ORDER — KETAMINE HCL 10 MG/ML IJ SOLN
INTRAMUSCULAR | Status: DC | PRN
  Administered 2022-07-05: 50 mg via INTRAVENOUS

## 2022-07-05 MED ORDER — FENTANYL 2500MCG IN NS 250ML (10MCG/ML) PREMIX INFUSION
0.0000 ug/h | INTRAVENOUS | Status: DC
  Administered 2022-07-05: 25 ug/h via INTRAVENOUS
  Administered 2022-07-06: 200 ug/h via INTRAVENOUS
  Administered 2022-07-07 (×2): 250 ug/h via INTRAVENOUS
  Administered 2022-07-08: 225 ug/h via INTRAVENOUS
  Filled 2022-07-05 (×6): qty 250

## 2022-07-05 MED ORDER — FENTANYL CITRATE PF 50 MCG/ML IJ SOSY: 50.0000 ug | PREFILLED_SYRINGE | INTRAMUSCULAR | Status: DC | PRN

## 2022-07-05 MED ORDER — INSULIN ASPART 100 UNIT/ML IJ SOLN
0.0000 [IU] | INTRAMUSCULAR | Status: DC
  Administered 2022-07-05: 11 [IU] via SUBCUTANEOUS
  Administered 2022-07-05 – 2022-07-06 (×3): 5 [IU] via SUBCUTANEOUS
  Administered 2022-07-06: 8 [IU] via SUBCUTANEOUS
  Administered 2022-07-06: 5 [IU] via SUBCUTANEOUS
  Administered 2022-07-06: 15 [IU] via SUBCUTANEOUS
  Administered 2022-07-06: 11 [IU] via SUBCUTANEOUS
  Administered 2022-07-07: 5 [IU] via SUBCUTANEOUS
  Administered 2022-07-07: 3 [IU] via SUBCUTANEOUS
  Administered 2022-07-07 (×4): 5 [IU] via SUBCUTANEOUS
  Administered 2022-07-08: 11 [IU] via SUBCUTANEOUS

## 2022-07-05 MED ORDER — PROPOFOL 10 MG/ML IV BOLUS
INTRAVENOUS | Status: DC | PRN
  Administered 2022-07-05: 100 mg via INTRAVENOUS
  Administered 2022-07-05 (×2): 60 mg via INTRAVENOUS
  Administered 2022-07-05 (×2): 40 mg via INTRAVENOUS

## 2022-07-05 NOTE — ED Notes (Signed)
Admitting at bedside 

## 2022-07-05 NOTE — Telephone Encounter (Signed)
**  After Hours/ Emergency Line Call**  Received a call to report that Josephene Marrone is having issues with her neck and throat since being under anesthesia for eye surgery on Friday. She reports that she feels like her neck is still sore and she has mucous in the back of her throat that is making it hard for her to breathe. She feels that she is having difficulty with getting water or food down her throat and when she lays on her back she can't breath well because it feels like "something is crushing my windpipe". She denies inability to drink some water and has no blood coming up in her mucous, denies cough. Discussed red flags with patient to get evaluated in the ER if emergent symptoms, otherwise feel appropriate to evaluate in the clinic to ensure no other issues of concerns going on at this time. Forward to PCP and the provider the patient has been scheduled with.  Rise Patience, DO  PGY-3, [facility] 07/05/2022 6:40 AM

## 2022-07-05 NOTE — Significant Event (Signed)
Admitting team received chat from ED physician around 3:30 pm that patient's respiratory status had decompensated further, was no longer able to phonate, and that the ED team was moving her to room 5 to prepare to intubate.   Admitting team (myself and Dr. Gwendolyn Lima) went to bedside at ED #5. Patient no longer able to phonate, was more dyspneic than previous, and appeared visibly anxious. Anesthesia already at bedside and setting up, had been called by ED.  CCM was called for ICU admission and assistance with likely difficult intubation. ENT Dr. Constance Holster also called for update. Both CCM and ENT arrived to bedside within minutes.   Patient underwent intubation, successful on second attempt. See full documentation from anesthesia regarding procedure. CCM will admit to ICU. Family medicine team will be happy to take patient once she is out of ICU.   Ezequiel Essex, MD

## 2022-07-05 NOTE — Consult Note (Signed)
NAME:  [name], MRN:  [id number], DOB:  1976/01/30, LOS: 0 ADMISSION DATE:  07/05/2022 CONSULTATION DATE:  07/05/2022 REFERRING MD:  Jeani Hawking - FMTS CHIEF COMPLAINT:  Ludwig's angina, compromised airway   History of Present Illness:  46 year old woman who presented to [facility] ED 7/31 as a transfer from [facility] ED for further evaluation of progressively worsening throat pain and dysphagia. 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 PMHx significant for HTN, HLD, acute-on-chronic CHF, CAD, COPD/asthma, OSA on CPAP, T2DM (c/b diabetic peripheral neuropathy and bilateral BKAs), GERD, bipolar disorder.  Patient initially contacted family medicine after hours phone line regarding neck soreness and "mucus" in the back of her throat causing difficulty breathing.  Also reported dysphagia with inability to take PO.  At the time of the phone call denied any blood or associated cough with throat discomfort.  Patient presented to [facility] ED a few hours later via EMS with complaints of progressively worsening throat discomfort and dysphagia since eye surgery at [facility] 7/28.  At the time of presentation, patient was noted to have increasing fullness of the area below the angle of the mandible with associated redness and severe pain with concern for deep space infection of the neck, possibly Ludwig's angina vs. tracheal perforation.  Patient then developed trismus with concern for airway compromise.  ENT was called for evaluation.  Empiric cefepime/vanc were initiated.  Patient was transferred from [facility] ED to [facility] ED for further care and fiberoptic nasopharyngoscopy with ENT.  CT Neck 7/31 demonstrated edema and inflammatory stranding around the left submandibular gland, consistent with acute left submandibular sialoadenitis.  Edema and inflammatory stranding were also noted to extend into the ventral upper neck with foci of gas within the left submandibular gland, possibly secondary to infection with gas-producing organism and  associated pharyngitis.  While awaiting floor bed, patient decompensated from an airway protection standpoint, became stridorous and PCCM was consulted for admission.  On arrival to evaluate patient, anesthesia was preparing to intubate.  Intubation was successful on second attempt.  Dr. Constance Holster with ENT was present at bedside during intubation, noting excessive purulent secretions but no significant swelling of the supraglottic larynx recommendation for conservative management with IV antibiotics.  PCCM admitted patient for further care.  Pertinent Medical History:   Past Medical History:  Diagnosis Date   Abscess of groin, left    Acute on chronic diastolic (congestive) heart failure ([place]) 02/05/2022   Acute on chronic respiratory failure with hypoxia ([name]) 04/11/2022   Acute osteomyelitis of ankle or foot, left ([place]) 01/31/2018   Acute respiratory failure with hypoxia and hypercarbia ([place]) 06/15/2020   Alveolar hypoventilation    Amputation stump infection ([place]) 04/09/2018   Anemia    not on iron pill   Asthma    Bipolar 2 disorder ([name])    Candidiasis of vagina 05/15/2022   Carpal tunnel syndrome on right    recurrent   Cellulitis 08/2010-08/2011   Chest pain with low risk for cardiac etiology 05/11/2021   Chronic pain    COPD (chronic obstructive pulmonary disease) ([name])    Symbicort daily and Proventil as needed   Costochondritis    De Quervain's tenosynovitis, bilateral 11/01/2015   Depression    Diabetes mellitus type II, uncontrolled 2000   Type 2, Uncontrolled.Takes Lantus daily.Fasting blood sugar runs 150   Diabetic ulcer of right foot ([place]) 12/19/2021   Drug-seeking behavior    Elevated troponin    Exposure to mold 04/21/2020   GERD (gastroesophageal reflux disease)  takes Pantoprazole and Zantac daily   HLD (hyperlipidemia)    takes Atorvastatin daily   Hypertension    takes Lisinopril and Coreg daily   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[place]) 04/11/2019   Left shoulder pain 06/23/2019    Morbid obesity ([place])    Morbid obesity with BMI of 60.0-69.9, adult ([place]) 04/15/2021   Nocturia    Nonobstructive atherosclerosis of coronary artery 11/26/2020   11/26/20 Coronary angiogram: 1.  Mild diffuse proximal LAD stenosis with no evidence of obstructive disease (20 to 30% diffuse stenosis);  2.  Left dominant circumflex with widely patent obtuse marginal branches and left PDA branch, no significant stenoses present  3.  Nondominant RCA with mild diffuse nonobstructive stenosis    OSA on CPAP    Peripheral neuropathy    takes Gabapentin daily   Pneumonia    "walking" several yrs ago and as a baby (12/05/2018)   Pneumonia due to COVID-19 virus 04/14/2021   Primary osteoarthritis of first carpometacarpal joint of left hand 07/30/2016   Rectal fissure    Restless leg    Right carpal tunnel syndrome 09/01/2011   SVT (supraventricular tachycardia) ([place])    Syncope 02/25/2016   Thrombocytosis 04/11/2022   Urinary frequency    Urinary incontinence 10/23/2020   UTI (urinary tract infection) 04/15/2021   Varicose veins    Right medial thigh and Left leg    Significant Hospital Events: Including procedures, antibiotic start and stop dates in addition to other pertinent events   7/28 - Underwent eye surgery at [facility] 7/31 Presented to [facility] ED via EMS for progressively worsening throat pain and associated dysphagia, ENT consulted and recommended transfer to [facility] ED for nasopharyngeal scope.While awaiting bed became stridorous with concern for airway compromise and anesthesia called to intubate. Intubated in ED. PCCM consulted for admission and escalation of care.  Interim History / Subjective:  As above  Objective:  Blood pressure (!) 143/117, pulse 98, temperature 98.3 F (36.8 C), temperature source Oral, resp. rate 14, height 5' (1.524 m), weight (!) 155 kg, last menstrual period 07/05/2022, SpO2 100 %.    Vent Mode: PRVC FiO2 (%):  [40 %-100 %] 40 % Set Rate:  [18  bmp] 18 bmp Vt Set:  [360 mL] 360 mL PEEP:  [8 cmH20] 8 cmH20   Intake/Output Summary (Last 24 hours) at 07/05/2022 1808 Last data filed at 07/05/2022 1303 Gross per 24 hour  Intake 1200 ml  Output --  Net 1200 ml   Filed Weights   07/05/22 1614  Weight: (!) 155 kg   Physical Examination: General: Acute-on-chronically ill-appearing woman in respiratory distress. HEENT: [place]/AT, anicteric sclera, PERRL, moist mucous membranes, neck with 2+ submandibular swelling (generalized, L > R), erythema and pain with palpation. Neuro:  Awake, disoriented.  Responds to verbal stimuli. Following commands intermittently. Moves all 4 extremities spontaneously. +Cough and +Gag  CV: RRR, no m/g/r. PULM: Breathing even and unlabored on vent post-extubation (PEEP 8, FiO2 40%). Lung fields CTAB. GI: Obese, soft, nontender, nondistended. Normoactive bowel sounds. Extremities: No significant LE edema noted. Bilateral BKAs. Skin: Warm/dry, erythema noted to submandibular skin.  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 Progressively worsening throat pain and dysphagia since eye surgery 7/28. CT Neck with edema/inflammatory stranding around the left submandibular gland, consistent with acute left submandibular sialoadenitis, extending into the ventral upper neck with foci of gas within the left submandibular gland, possibly  secondary to infection with gas-producing organism. ENT consulted for evaluation, recommended transfer to [facility] for fiberoptic nasopharyngeal evaluation. Intubated in ED 7/31. - Admit to ICU for further monitoring - ENT consulted, appreciate recommendations; evaluated in ED during intubation - Conservative management at present - Continue cefepime/vanc - Keep intubated for airway protection; will need to discuss extubation versus tracheostomy pending progess  Acute hypoxemic respiratory failure in the  setting of deep soft tissue throat infection COPD OSA on CPAP - Continue full vent support (4-8cc/kg IBW) - Wean FiO2 for O2 sat > 90% - Daily WUA/SBT - VAP bundle - Bronchodilators (albuterol, DuoNeb, Brovana) - Pulmonary hygiene - PAD protocol for sedation: Propofol and Fentanyl for goal RASS 0 to -1 - Extubation will be limited by ongoing neck soft tissue swelling, risk of airway compromise; discuss with ENT re: extubation trial vs. trach  Acute on chronic diastolic heart failure CAD HTN HLD Echo 02/2021 with EF 55-60%, mild LVH. - Diuresis as tolerated, takes torsemide at home - Monitor strict I&Os - Continue ASA - Hold home amlodipine for now, resume as appropriate - Cardiac monitoring  Type 2 diabetes mellitus Bilateral lower extremity amputation - Basal Semglee - Consider TF coverage if enteral feeding initiated - SSI - CBGs Q4H - Goal CBG 140-180  Diabetic peripheral neuropathy - Hold home gabapentin, Cymbalta for now  At risk for malnutrition - Begin TF  GERD - PPI  Bipolar 2 disorder - No meds noted to home med list  Best Practice: (right click and "Reselect all SmartList Selections" daily)   Diet/type: NPO w/ meds via tube DVT prophylaxis: SCDs GI prophylaxis: PPI Lines: N/A Foley:  N/A Code Status:  full code Last date of multidisciplinary goals of care discussion [Pending]  Labs:  CBC: Recent Labs  Lab 07/05/22 0857 07/05/22 0905 07/05/22 1659  WBC 15.2*  --   --   HGB 11.6* 12.6 11.9*  HCT 36.3 37.0 35.0*  MCV 93.1  --   --   PLT 358  --   --    Basic Metabolic Panel: Recent Labs  Lab 07/05/22 0857 07/05/22 0905 07/05/22 1659  NA 134* 134* 135  K 4.9 4.4 4.7  CL 95* 96*  --   CO2 27  --   --   GLUCOSE 312* 319*  --   BUN 47* 45*  --   CREATININE 1.15* 1.30*  --   CALCIUM 8.7*  --   --    GFR: Estimated Creatinine Clearance: 77.9 mL/min (A) (by C-G formula based on SCr of 1.3 mg/dL (H)). Recent Labs  Lab 07/05/22 0857  07/05/22 0900  WBC 15.2*  --   LATICACIDVEN  --  1.2   Liver Function Tests: No results for input(s): "AST", "ALT", "ALKPHOS", "BILITOT", "PROT", "ALBUMIN" in the last 168 hours. No results for input(s): "LIPASE", "AMYLASE" in the last 168 hours. No results for input(s): "AMMONIA" in the last 168 hours.  ABG:    Component Value Date/Time   PHART 7.371 07/05/2022 1659   PCO2ART 55.9 (H) 07/05/2022 1659   PO2ART 369 (H) 07/05/2022 1659   HCO3 32.5 (H) 07/05/2022 1659   TCO2 34 (H) 07/05/2022 1659   ACIDBASEDEF 5.6 (H) 10/21/2021 1126   O2SAT 100 07/05/2022 1659   Coagulation Profile: No results for input(s): "INR", "PROTIME" in the last 168 hours.  Cardiac Enzymes: No results for input(s): "CKTOTAL", "CKMB", "CKMBINDEX", "TROPONINI" in the last 168 hours.  HbA1C: HbA1c POC (<> result, manual entry)  Date/Time Value Ref  Range Status  09/29/2021 10:46 AM >15.0 4.0 - 5.6 % Final  04/03/2020 10:25 AM >15.0 4.0 - 5.6 % Final   Hgb A1c MFr Bld  Date/Time Value Ref Range Status  05/16/2022 01:43 AM 14.9 (H) 4.8 - 5.6 % Final    Comment:    (NOTE) Pre diabetes:          5.7%-6.4%  Diabetes:              >6.4%  Glycemic control for   <7.0% adults with diabetes   12/19/2021 08:00 AM >15.5 (H) 4.8 - 5.6 % Final    Comment:    (NOTE) **Verified by repeat analysis**         Prediabetes: 5.7 - 6.4         Diabetes: >6.4         Glycemic control for adults with diabetes: <7.0    CBG: Recent Labs  Lab 07/05/22 0845  GLUCAP 295*   Review of Systems:   Patient is encephalopathic and/or intubated. Therefore history has been obtained from chart review.   Past Medical History:  She,  has a past medical history of Abscess of groin, left, Acute on chronic diastolic (congestive) heart failure ([place]) (02/05/2022), Acute on chronic respiratory failure with hypoxia ([place]) (04/11/2022), Acute osteomyelitis of ankle or foot, left ([place]) (01/31/2018), Acute respiratory failure with hypoxia  and hypercarbia ([place]) (06/15/2020), Alveolar hypoventilation, Amputation stump infection ([place]) (04/09/2018), Anemia, Asthma, Bipolar 2 disorder ([place]), Candidiasis of vagina (05/15/2022), Carpal tunnel syndrome on right, Cellulitis (08/2010-08/2011), Chest pain with low risk for cardiac etiology (05/11/2021), Chronic pain, COPD (chronic obstructive pulmonary disease) ([place]), Costochondritis, De Quervain's tenosynovitis, bilateral (11/01/2015), Depression, Diabetes mellitus type II, uncontrolled (2000), Diabetic ulcer of right foot ([place]) (12/19/2021), Drug-seeking behavior, Elevated troponin, Exposure to mold (04/21/2020), GERD (gastroesophageal reflux disease), HLD (hyperlipidemia), Hypertension, 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[place]) (04/11/2019), Left shoulder pain (06/23/2019), Morbid obesity ([place]), Morbid obesity with BMI of 60.0-69.9, adult ([place]) (04/15/2021), Nocturia, Nonobstructive atherosclerosis of coronary artery (11/26/2020), OSA on CPAP, Peripheral neuropathy, Pneumonia, Pneumonia due to COVID-19 virus (04/14/2021), Primary osteoarthritis of first carpometacarpal joint of left hand (07/30/2016), Rectal fissure, Restless leg, Right carpal tunnel syndrome (09/01/2011), SVT (supraventricular tachycardia) ([place]), Syncope (02/25/2016), Thrombocytosis (04/11/2022), Urinary frequency, Urinary incontinence (10/23/2020), UTI (urinary tract infection) (04/15/2021), and Varicose veins.   Surgical History:   Past Surgical History:  Procedure Laterality Date   AMPUTATION Left 02/01/2018   Procedure: LEFT FOURTH AND 5TH TOE RAY AMPUTATION;  Surgeon: [name], MD;  Location: [place];  Service: Orthopedics;  Laterality: Left;   AMPUTATION Left 03/03/2018   Procedure: LEFT BELOW KNEE AMPUTATION;  Surgeon: [name], MD;  Location: [place];  Service: Orthopedics;  Laterality: Left;   AMPUTATION Right 05/21/2022   Procedure: REVISION AMPUTATION RAY 1ST;  Surgeon: [name], DPM;  Location: [place];  Service: Podiatry;  Laterality:  Right;   CARPAL TUNNEL RELEASE Bilateral    CESAREAN SECTION  2007   CORONARY ANGIOGRAPHY N/A 11/26/2020   Procedure: CORONARY ANGIOGRAPHY;  Surgeon: Sherren Mocha, MD;  Location: [facility];  Service: Cardiovascular;  Laterality: N/A;   FOOT AMPUTATION Right    INCISION AND DRAINAGE ABSCESS Left 10/16/2021   Procedure: INCISION AND DRAINAGE ABSCESS;  Surgeon: Dwan Bolt, MD;  Location: [place];  Service: General;  Laterality: Left;   INCISION AND DRAINAGE PERIRECTAL ABSCESS Left 05/18/2019   Procedure: IRRIGATION AND DEBRIDEMENT OF PANNIS ABSCESS, POSSIBLE DEBRIDEMENT OF BUTTOCK WOUND;  Surgeon:  Donnie Mesa, MD;  Location: [place];  Service: General;  Laterality: Left;   IRRIGATION AND DEBRIDEMENT BUTTOCKS Left 05/17/2019   Procedure: IRRIGATION AND DEBRIDEMENT BUTTOCKS;  Surgeon: Donnie Mesa, MD;  Location: [place];  Service: General;  Laterality: Left;   KNEE ARTHROSCOPY Right 07/17/2010   LEFT HEART CATHETERIZATION WITH CORONARY ANGIOGRAM N/A 07/27/2012   Procedure: LEFT HEART CATHETERIZATION WITH CORONARY ANGIOGRAM;  Surgeon: Sherren Mocha, MD;  Location: [facility];  Service: Cardiovascular;  Laterality: N/A;   MASS EXCISION N/A 06/29/2013   Procedure:  WIDE LOCAL EXCISION OF POSTERIOR NECK ABSCESS;  Surgeon: Ralene Ok, MD;  Location: [place];  Service: General;  Laterality: N/A;   REPAIR KNEE LIGAMENT Left    "fixed ligaments and chipped patella"   right transmetatarsal amputation      Social History:   reports that she quit smoking about 16 years ago. Her smoking use included cigarettes. She has a 3.75 pack-year smoking history. She has never used smokeless tobacco. She reports that she does not currently use drugs. She reports that she does not drink alcohol.   Family History:  Her family history includes Allergic rhinitis in her mother; Anxiety disorder in her sister; Asthma in her child; Cancer in her maternal grandmother; Depression in her mother; Diabetes in  her maternal grandmother and mother; GER disease in her mother; Heart attack in her father, paternal grandfather, paternal grandmother, and paternal uncle; Heart disease in her paternal grandfather and paternal grandmother; Hyperlipidemia in her maternal grandmother and mother; Hypertension in her maternal grandmother; Migraines in her sister; Other in her maternal grandfather; Restless legs syndrome in her mother.   Allergies: Allergies  Allergen Reactions   Cefepime Other (See Comments)    AKI, see records from [place] [facility] hospitalization in January 2020.   Other reaction(s): Other (See Comments) "Shut down organs/kidneys"  Note pt has tolerated Rocephin and Keflex   Iodine Other (See Comments)    Kidney dysfunction   Kiwi Extract Shortness Of Breath, Swelling and Anaphylaxis   Morphine And Related Nausea And Vomiting   Nalbuphine Other (See Comments)    Hallucinations     Trental [Pentoxifylline] Nausea And Vomiting   Cephalosporins Other (See Comments)    Pt states that they cause her kidneys to shut down   Toradol [Ketorolac Tromethamine] Other (See Comments)    Feels like something is crawling on me   Morphine Nausea And Vomiting   Nubain [Nalbuphine Hcl] Other (See Comments)    "FEELS LIKE SOMETHING CRAWLING ON ME"    Home Medications: Prior to Admission medications   Medication Sig Start Date End Date Taking? Authorizing Provider  acetaminophen (TYLENOL) 325 MG tablet Take 1-2 tablets (325-650 mg total) by mouth every 4 (four) hours as needed for mild pain. 06/04/22  Yes Setzer, Edman Circle, PA-C  albuterol (VENTOLIN HFA) 108 (90 Base) MCG/ACT inhaler INHALE 1 TO 2 PUFFS BY MOUTH EVERY SIX HOURS AS NEEDED FOR WHEEZING OR SHORTNESS OF BREATH Patient taking differently: Inhale 1 puff into the lungs every 6 (six) hours as needed for shortness of breath. 07/16/21  Yes [name], MD  amLODipine (NORVASC) 10 MG tablet Take 1 tablet (10 mg total) by mouth daily. 06/04/22  Yes Setzer,  Edman Circle, PA-C  aspirin EC 81 MG tablet Take 1 tablet (81 mg total) by mouth daily. Swallow whole. 11/19/20  Yes Sherren Mocha, MD  atropine 1 % ophthalmic solution Place 1 drop into the right eye daily.   Yes [provider]  Brinzolamide-Brimonidine (SIMBRINZA) 1-0.2 % SUSP Place 1 drop into the left eye in the morning, at noon, and at bedtime.   Yes [provider]  cetirizine (ZYRTEC) 10 MG tablet Take 1 tablet (10 mg total) by mouth daily as needed for allergies. 02/11/22  Yes [name], MD  Cholecalciferol 1000 units tablet Take 1 tablet (1,000 Units total) by mouth daily. 04/05/17  Yes [name], MD  DULoxetine (CYMBALTA) 30 MG capsule Take 1 capsule (30 mg total) by mouth daily. X 1 week; then 60 mg daily- for knee and back/nerve pain Patient taking differently: Take 60 mg by mouth daily. 06/04/22  Yes Setzer, Edman Circle, PA-C  gabapentin (NEURONTIN) 600 MG tablet Take 2 tablets (1,200 mg total) by mouth 2 (two) times daily. 06/04/22  Yes Setzer, Edman Circle, PA-C  insulin aspart (NOVOLOG FLEXPEN) 100 UNIT/ML FlexPen Inject 20 Units into the skin 3 (three) times daily with meals. 05/25/22  Yes Alcus Dad, MD  insulin degludec (TRESIBA FLEXTOUCH) 200 UNIT/ML FlexTouch Pen Inject 52 Units into the skin 2 (two) times daily. 06/04/22  Yes Setzer, Edman Circle, PA-C  latanoprost (XALATAN) 0.005 % ophthalmic solution Place 1 drop into the left eye at bedtime. 06/29/22  Yes [provider]  moxifloxacin (VIGAMOX) 0.5 % ophthalmic solution Place 1 drop into the right eye 4 (four) times daily.   Yes [provider]  neomycin-polymyxin b-dexamethasone (MAXITROL) 3.5-10000-0.1 OINT Place 1 Application into the right eye at bedtime.   Yes [provider]  nystatin (MYCOSTATIN/NYSTOP) powder Apply 1 application. topically 3 (three) times daily. 03/30/22  Yes [name], MD  ondansetron (ZOFRAN) 4 MG tablet Take 1 tablet (4 mg total) by mouth  every 8 (eight) hours as needed for nausea or vomiting. 02/17/22  Yes Lovorn, Jinny Blossom, MD  oxyCODONE-acetaminophen (PERCOCET) 7.5-325 MG tablet Take 1 tablet by mouth every 6 (six) hours as needed for severe pain. 04/27/22  Yes [name], MD  prednisoLONE acetate (PRED FORTE) 1 % ophthalmic suspension Place 1 drop into both eyes 4 (four) times daily. Patient taking differently: Place 1 drop into the right eye 4 (four) times daily. 06/04/22  Yes Setzer, Edman Circle, PA-C  SYMBICORT 160-4.5 MCG/ACT inhaler inhale 2 PUFFS into THE lungs 2 TIMES DAILY Patient taking differently: 2 puffs every 6 (six) hours. Shortness of breath 01/04/22  Yes Hensel, Jamal Collin, MD  Tiotropium Bromide Monohydrate (SPIRIVA RESPIMAT) 1.25 MCG/ACT AERS Inhale 2 puffs into the lungs daily. 11/14/20  Yes Martyn Ehrich, NP  torsemide 40 MG TABS Take 40 mg by mouth 2 (two) times daily. 06/04/22  Yes Setzer, Edman Circle, PA-C  traZODone (DESYREL) 50 MG tablet Take 0.5-1 tablets (25-50 mg total) by mouth at bedtime as needed for sleep. 06/04/22  Yes Setzer, Edman Circle, PA-C  triamcinolone ointment (KENALOG) 0.5 % Apply 1 Application topically 2 (two) times daily. 06/25/22  Yes Ganta, Anupa, DO  Accu-Chek FastClix Lancets MISC USE DAILY AS INSTRUCTED 05/18/21   [name], MD  ACCU-CHEK GUIDE test strip USE T0 CHECK BLOOD SUGAR 3 TIMES DAILY 11/05/21   [name], MD  Accu-Chek Softclix Lancets lancets Use to check blood glucose four times daily 04/17/20   Martyn Malay, MD  Continuous Blood Gluc Sensor (DEXCOM G6 SENSOR) MISC Inject 1 applicator into the skin as directed. Change sensor every 10 days. 11/05/21   [name], MD  Continuous Blood Gluc Transmit (DEXCOM G6 TRANSMITTER) MISC Inject 1 Device  into the skin as directed. Reuse 8 times with sensor changes. 09/03/21   [name], MD  Glucagon (BAQSIMI ONE PACK) 3 MG/DOSE POWD Place 3 mg into the nose as needed (low blood sugar).    [provider]  Glucagon, rDNA, (GLUCAGON EMERGENCY) 1 MG KIT Inject 1 mg into the muscle as needed for low blood sugar. 11/03/21   [provider]  GNP ULTICARE PEN NEEDLES 32G X 4 MM MISC USE TO inject insulin 2 TIMES DAILY 10/02/21   [name], MD    Critical care time: 42 minutes   The patient is critically ill with multiple organ system failure and requires high complexity decision making for assessment and support, frequent evaluation and titration of therapies, advanced monitoring, review of radiographic studies and interpretation of complex data.   Critical Care Time devoted to patient care services, exclusive of separately billable procedures, described in this note is 42 minutes.  Lestine Mount, PA-C [facility] 07/05/22 6:08 PM  Please see [email or web address] for pager details.  From 7A-7P if no response, please call [phone] After hours, please call ELink [phone]

## 2022-07-05 NOTE — Progress Notes (Signed)
Patient transported to 3M03 from ED without complications. RN at bedside.

## 2022-07-05 NOTE — ED Provider Notes (Signed)
  Face-to-face evaluation   History: She presents for gradually worse pain and swelling in the left submandibular region, since endotracheal intubation for heart surgery, 4 days ago.  She was able to eat 3 days ago but has not had much to eat or drink since then because of pain and discomfort.  She denies fever.  She denies cough.  Physical exam: Morbidly obese patient with bilateral leg amputations, presenting with shortness of breath and swelling of the left neck.  She has trismus, and can only open her mouth about 1/2 cm because of pain in the left side of her neck.  Left neck is swollen, with erythema, and guarding against palpation.  The right side of her neck is swollen but she states it is not tender.  Left side of the neck may be slightly more swollen than the right.  MDM: Evaluation for  Chief Complaint  Patient presents with   Sore Throat     Patient presenting with post endotracheal intubation pain and swelling of the neck.  Differential diagnosis includes trauma to the oropharynx, local infection, and inflammatory edema.  Will initiate empiric antibiotics, consider CT imaging and discuss case with ENT regarding dental pending airway compromise.  At the time of initial evaluation 9:10 AM, the patient does not have stridor, muffled voice or air hunger.  Oxygen saturation 96% on nasal cannula at 2 L per minute.  Medical screening examination/treatment/procedure(s) were conducted as a shared visit with non-physician practitioner(s) and myself.  I personally evaluated the patient during the encounter    Daleen Bo, MD 07/06/22 [phone]

## 2022-07-05 NOTE — Progress Notes (Signed)
Was called back to the emergency department about 3:30 PM today because the patient had been transferred to [facility] emergency department and was having difficulty breathing and oxygenation.  Upon my arrival anesthesia was initiating pharmacologic treatment for intubation.  I was available during the intubation and watched the monitor for the glide scope.  Intubation was not difficult.  There was excessive secretions and some purulent looking secretions in the pharynx.  There is no significant swelling of the supraglottic larynx.  The trismus partially lessened after the ketamine was instituted.  Patient was intubated successfully and will be admitted to the pulmonary critical care service.  I will monitor over the next couple of days and we will talk about either extubation or if necessary tracheostomy.

## 2022-07-05 NOTE — Progress Notes (Signed)
A consult was received from an ED physician for vancomycin and cefepime per pharmacy dosing (for an indication other than meningitis). The patient's profile has been reviewed for ht/wt/allergies/indication/available labs. A one time order has been placed for the above antibiotics.  Further antibiotics/pharmacy consults should be ordered by admitting physician if indicated.                       Reuel Boom, PharmD, BCPS [phone] 07/05/2022, 9:31 AM

## 2022-07-05 NOTE — ED Provider Notes (Addendum)
  Physical Exam  BP 129/63 (BP Location: Left Wrist)   Pulse 87   Temp 98.3 F (36.8 C) (Oral)   Resp 15   LMP 07/05/2022 (Approximate)   SpO2 94%     Procedures  .Critical Care  Performed by: Regan Lemming, MD Authorized by: Regan Lemming, MD   Critical care provider statement:    Critical care time (minutes):  30   Critical care was time spent personally by me on the following activities:  Development of treatment plan with patient or surrogate, discussions with consultants, evaluation of patient's response to treatment, examination of patient, ordering and review of laboratory studies, ordering and review of radiographic studies, ordering and performing treatments and interventions, pulse oximetry, re-evaluation of patient's condition and review of old charts   Care discussed with: admitting provider     ED Course / MDM   Clinical Course as of 07/05/22 1407  Mon Jul 05, 2022  0908 This is a 46 year old female here with sore throat and neck pain, recent surgery with intubation.  Concern that patient has deep space infection of the neck including Ludwick's angina.  May be dental in origination however given recent intubation she could have tracheal perforation or other cellulitis/abscess of the region.  I have ordered labs, pain meds and CT soft tissues of the neck for further work-up. [AH]    Clinical Course User Index [AH] Margarita Mail, PA-C   Medical Decision Making Amount and/or Complexity of Data Reviewed Labs: ordered. Radiology: ordered.  Risk Prescription drug management. Decision regarding hospitalization.   Received patient in transfer from [facility] long emergency department for ENT evaluation for nasopharyngoscopy in the setting of concern for potential Ludwig's Angina.  The patient will need admission.  She is status post IV antibiotics.  On my evaluation, the patient had trismus, she has significant tenderness to palpation of the left submandibular gland with  swelling submandibularly.  She is phonating appropriately and tolerating her secretions at this time with no immediate concern for an airway emergency.  Family medicine consulted for admission and to intermediate care/stepdown status.  ENT consulted on patient arrival, had previously been aware of the patient at [facility]  Patient was stable vitals and a stable airway at time of admission.   3:40 PM Following admission, the patient had been waiting on a bed in the emergency department and clinically deteriorated.  I was called into her room to evaluate her and she was found to have stridor and an inability to phonate.  She felt like her throat was closing up and she could not breathe.  I paged the inpatient service in addition to anesthesia and pulmonary critical care was subsequently paged for escalation of care for admission following intubation.    Regan Lemming, MD 07/05/22 1443    Regan Lemming, MD 07/05/22 [phone]

## 2022-07-05 NOTE — ED Notes (Signed)
[facility] Called by Karsten Fells

## 2022-07-05 NOTE — ED Triage Notes (Signed)
Pt BIB EMS from home. Pt reports having surgery and was intubated on Friday. Pt now endorses sore throat and pain in throat with breathing.

## 2022-07-05 NOTE — Anesthesia Procedure Notes (Addendum)
Procedure Name: Intubation Date/Time: 07/05/2022 4:00 PM  Performed by: Rande Brunt, CRNAPre-anesthesia Checklist: Patient identified, Emergency Drugs available, Suction available and Patient being monitored Patient Re-evaluated:Patient Re-evaluated prior to induction Oxygen Delivery Method: Simple face mask Preoxygenation: Pre-oxygenation with 100% oxygen Induction Type: IV induction Ventilation: Mask ventilation throughout procedure and Two handed mask ventilation required Laryngoscope Size: Glidescope and 4 Grade View: Grade II Tube size: 7.0 mm Airway Equipment and Method: Video-laryngoscopy and Stylet Placement Confirmation: ETT inserted through vocal cords under direct vision, positive ETCO2 and CO2 detector Secured at: 21 cm Dental Injury: Teeth and Oropharynx as per pre-operative assessment  Difficulty Due To: Difficulty was anticipated, Difficult Airway- due to large tongue, Difficult Airway- due to limited oral opening and Difficult Airway-  due to edematous airway Comments: Called by ED physician for difficult airway. Emergency airway equipment at bedside. Glidescope used by Dr. Gloris Manchester, difficult view with lots of secretions. + ETCO2, chest rise and visualization through cords.

## 2022-07-05 NOTE — H&P (Cosign Needed Addendum)
Hospital Admission History and Physical Service Pager: [phone]  Patient name: [name] Medical record number: [id number] Date of Birth: [birth year] Age: 46 y.o. Gender: female  Primary Care Provider: [name], MD Consultants: ENT Code Status: Full  Preferred Emergency Contact:  Contact Information     Name Relation Home Work Mobile   [place] Mother   [phone]   [place] Daughter   [phone]   Alben Spittle   [phone]   Sherle Poe Spouse   [phone]      Chief Complaint: Throat pain   Assessment and Plan: [name] is a 46 y.o. female presenting with throat pain in the setting of recent intubation. Differential for this patient's presentation includes pharyngeal infection, ludwig's angina, angioedema, anaphylaxis. Anaphylaxis and angioedema less likely secondary to subacute presentation rather than acute.  Primary team received message that patient's status was acutely worsening and pt could no longer phonate. Pt was intubated by anesthesia with ENT at bedside. CCM was consulted, will admit patient to ICU. See significant event note in chart.   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  - Admit to FPTS, Progressive, Attending Dr. Dorcas Mcmurray  - ENT consulted, appreciate recs  - F/u ENT scope  - NPO , hold home medications  - Start  - Continue broad spectrum antibiotics  - F/u BCx - Monitor airway and resp status  - PT/OT Eval and Treat    Chest pain Patient mentioned chest pain earlier in the ED, resolved spontaneously. Currently hemodynamically stable.  - EKG  - Troponins    FEN/GI: NPO for ENT scope VTE Prophylaxis: Lovenox  Disposition: Progressive  History of Present Illness:  [name] is a 46 y.o. female presenting with throat pain and swelling. She recently had eye surgery at [facility]  requiring intubation. When she was extubated she had some throat soreness. However, pain and swelling increased over the next two days. Over the past day she had trouble swallowing and breathing, so she presented to [facility]. Patient denies fevers, chills, vomiting, shortness of breath.   Patient did endorse chest pain earlier in her ED stay. Denied current chest pain.   At [facility] patient was evaluated by ENT. ENT believed this is most likely secondary to pharyngeal trauma with intubation resulting in infection. She was found to have trismus,  tenderness of submandibular gland, and swelling. She was given IV broad spectrum antibiotics. Patient was transferred to [facility] from [facility] after discussions with ENT, as [facility] did not have correct scope available to fully evaluate swelling. ENT advised medical admission with intent to evaluate at [facility].They recommended 48 hour observation, NPO, and continuation of broad spectrum antibiotics.   Review Of Systems: Per HPI.  Pertinent Past Medical History: CHF COPD T2DM HLD HTN CAD OSA Remainder reviewed in history tab.   Pertinent Past Surgical History: Bilateral BKA (2019, 2023) Heart cath (2013) Remainder reviewed in history tab.   Pertinent Social History: Tobacco use: Former, 3.75 pack years Alcohol use: No Other Substance use: No  Pertinent Family History: Mother: diabetes, HLD, GERD Father: MI Grandmother, grandfather, uncle: heart disease Remainder reviewed in history tab.   Important Outpatient Medications: Amlodipine 10 mg daily Cymbalta 30 mg daily Gabapentin 600 mg BID daily Novolog 20 units TID with meals Tresiba 52 units BID Percocet 7.5 Q6 prn Symbicort 160 2 puffs daily Torsemide 40 mg BID Remainder reviewed in medication history.   Objective: BP Marland Kitchen)  140/118   Pulse 97   Temp 98.3 F (36.8 C) (Oral)   Resp 18   Ht 5' (1.524 m)   Wt (!) 155 kg   LMP 07/05/2022 (Approximate)   SpO2 100%   BMI 66.74 kg/m   Exam: General: Anxious and uncomfortable appearing  Eyes: R eye has erythematous hemorrhage appearance on lateral aspect. EOMI, PERRLA ENTM: Difficulty opening mouth more than 2 cm, dry mucous membranes. Able to move tongue without issues. Pain when swallowing.  Neck: Erythema anteriorly. Edematous throughout anterior and lateral neck. Anterior neck indurated. Pain to light palpation from mandible through neck bilaterally.  Cardiovascular: RRR, pulses present radially bilaterally, warm extremities (BKAs)  Respiratory: breathing on 3L O2, uncomfortable with breathing limited by throat pain. CTAB  Gastrointestinal: Soft, non tender, non distended  Labs:  CBC BMET  Recent Labs  Lab 07/05/22 0857 07/05/22 0905  WBC 15.2*  --   HGB 11.6* 12.6  HCT 36.3 37.0  PLT 358  --    Recent Labs  Lab 07/05/22 0857 07/05/22 0905  NA 134* 134*  K 4.9 4.4  CL 95* 96*  CO2 27  --   BUN 47* 45*  CREATININE 1.15* 1.30*  GLUCOSE 312* 319*  CALCIUM 8.7*  --     Lactic acid 1.2  EKG: NSR, regular rate, no STEMI/BBB/AV block  Imaging Studies Performed: CT Soft Tissue Neck w/ Contrast IMPRESSION: Edema and inflammatory stranding surrounding the left submandibular gland consistent with acute left submandibular sialoadenitis. Edema and inflammatory stranding also extend into the ventral upper neck. There are foci of gas within the left submandibular gland, which may be secondary to infection with a gas-producing organism, or potentially related to recent positive pressure ventilation. Mucosal/submucosal edema within the left oropharynx, suggesting pharyngitis. Left-sided cervical lymphadenopathy, as described and likely reactive.   Lowry Ram, MD 07/05/2022, 3:25 PM PGY-1, [place] Intern pager: [phone], text pages welcome Secure chat group [facility] Teaching Service   Upper Level Addendum: I have seen and evaluated this patient along with  Dr. Gwendolyn Lima and reviewed the above note, making necessary revisions as appropriate. I agree with the medical decision making and physical exam as noted above. Ezequiel Essex, MD PGY-3 [place] [facility]

## 2022-07-05 NOTE — Progress Notes (Deleted)
    SUBJECTIVE:   CHIEF COMPLAINT / HPI:  No chief complaint on file.   ***  PERTINENT  PMH / PSH: ***  Patient Care Team: [name], MD as PCP - General (Family Medicine) Sherren Mocha, MD as PCP - Cardiology (Cardiology) Sueanne Margarita, MD as PCP - Sleep Medicine (Cardiology)   OBJECTIVE:   LMP 05/06/2022 (Approximate)   Physical Exam      06/25/2022   11:54 AM  Depression screen PHQ 2/9  Decreased Interest 0  Down, Depressed, Hopeless 0  PHQ - 2 Score 0  Altered sleeping 0  Tired, decreased energy 0  Change in appetite 0  Feeling bad or failure about yourself  0  Trouble concentrating 0  Moving slowly or fidgety/restless 0  Suicidal thoughts 0  PHQ-9 Score 0  Difficult doing work/chores Not difficult at all     {Show previous vital signs (optional):23777}  {Labs  Heme  Chem  Endocrine  Serology  Results Review (optional):23779}  ASSESSMENT/PLAN:   No problem-specific Assessment & Plan notes found for this encounter.    No follow-ups on file.   Zola Button, MD [place]

## 2022-07-05 NOTE — Consult Note (Signed)
Reason for Consult: Sore throat Referring Physician: Daleen Bo, MD  [name] is an 46 y.o. female.  HPI: Patient with complicated medical history including morbid obesity, diabetes, COPD, sleep apnea, underwent ophthalmologic procedure at [facility] a few days ago.  When she was in the recovery room she remembers having a sore throat and over the past several days it has worsened.  She has gotten to where she was feeling like she was having trouble breathing earlier today and having a hard time swallowing.  Past Medical History:  Diagnosis Date   Abscess of groin, left    Acute on chronic diastolic (congestive) heart failure ([place]) 02/05/2022   Acute on chronic respiratory failure with hypoxia ([name]) 04/11/2022   Acute osteomyelitis of ankle or foot, left ([place]) 01/31/2018   Acute respiratory failure with hypoxia and hypercarbia ([place]) 06/15/2020   Alveolar hypoventilation    Amputation stump infection ([place]) 04/09/2018   Anemia    not on iron pill   Asthma    Bipolar 2 disorder ([name])    Candidiasis of vagina 05/15/2022   Carpal tunnel syndrome on right    recurrent   Cellulitis 08/2010-08/2011   Chest pain with low risk for cardiac etiology 05/11/2021   Chronic pain    COPD (chronic obstructive pulmonary disease) ([name])    Symbicort daily and Proventil as needed   Costochondritis    De Quervain's tenosynovitis, bilateral 11/01/2015   Depression    Diabetes mellitus type II, uncontrolled 2000   Type 2, Uncontrolled.Takes Lantus daily.Fasting blood sugar runs 150   Diabetic ulcer of right foot ([place]) 12/19/2021   Drug-seeking behavior    Elevated troponin    Exposure to mold 04/21/2020   GERD (gastroesophageal reflux disease)    takes Pantoprazole and Zantac daily   HLD (hyperlipidemia)    takes Atorvastatin daily   Hypertension    takes Lisinopril and Coreg daily   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[place]) 04/11/2019   Left shoulder pain 06/23/2019   Morbid obesity ([place])    Morbid obesity with BMI  of 60.0-69.9, adult ([place]) 04/15/2021   Nocturia    Nonobstructive atherosclerosis of coronary artery 11/26/2020   11/26/20 Coronary angiogram: 1.  Mild diffuse proximal LAD stenosis with no evidence of obstructive disease (20 to 30% diffuse stenosis);  2.  Left dominant circumflex with widely patent obtuse marginal branches and left PDA branch, no significant stenoses present  3.  Nondominant RCA with mild diffuse nonobstructive stenosis    OSA on CPAP    Peripheral neuropathy    takes Gabapentin daily   Pneumonia    "walking" several yrs ago and as a baby (12/05/2018)   Pneumonia due to COVID-19 virus 04/14/2021   Primary osteoarthritis of first carpometacarpal joint of left hand 07/30/2016   Rectal fissure    Restless leg    Right carpal tunnel syndrome 09/01/2011   SVT (supraventricular tachycardia) ([place])    Syncope 02/25/2016   Thrombocytosis 04/11/2022   Urinary frequency    Urinary incontinence 10/23/2020   UTI (urinary tract infection) 04/15/2021   Varicose veins    Right medial thigh and Left leg     Past Surgical History:  Procedure Laterality Date   AMPUTATION Left 02/01/2018   Procedure: LEFT FOURTH AND 5TH TOE RAY AMPUTATION;  Surgeon: [name], MD;  Location: [place];  Service: Orthopedics;  Laterality: Left;   AMPUTATION Left 03/03/2018   Procedure: LEFT BELOW KNEE AMPUTATION;  Surgeon: [name], MD;  Location: [facility]  OR;  Service: Orthopedics;  Laterality: Left;   AMPUTATION Right 05/21/2022   Procedure: REVISION AMPUTATION RAY 1ST;  Surgeon: [name], DPM;  Location: [place];  Service: Podiatry;  Laterality: Right;   CARPAL TUNNEL RELEASE Bilateral    CESAREAN SECTION  2007   CORONARY ANGIOGRAPHY N/A 11/26/2020   Procedure: CORONARY ANGIOGRAPHY;  Surgeon: Sherren Mocha, MD;  Location: [place] CV LAB;  Service: Cardiovascular;  Laterality: N/A;   FOOT AMPUTATION Right    INCISION AND DRAINAGE ABSCESS Left 10/16/2021   Procedure: INCISION AND DRAINAGE  ABSCESS;  Surgeon: Dwan Bolt, MD;  Location: [place];  Service: General;  Laterality: Left;   INCISION AND DRAINAGE PERIRECTAL ABSCESS Left 05/18/2019   Procedure: IRRIGATION AND DEBRIDEMENT OF PANNIS ABSCESS, POSSIBLE DEBRIDEMENT OF BUTTOCK WOUND;  Surgeon: Donnie Mesa, MD;  Location: [place];  Service: General;  Laterality: Left;   IRRIGATION AND DEBRIDEMENT BUTTOCKS Left 05/17/2019   Procedure: IRRIGATION AND DEBRIDEMENT BUTTOCKS;  Surgeon: Donnie Mesa, MD;  Location: [place];  Service: General;  Laterality: Left;   KNEE ARTHROSCOPY Right 07/17/2010   LEFT HEART CATHETERIZATION WITH CORONARY ANGIOGRAM N/A 07/27/2012   Procedure: LEFT HEART CATHETERIZATION WITH CORONARY ANGIOGRAM;  Surgeon: Sherren Mocha, MD;  Location: [facility] CATH LAB;  Service: Cardiovascular;  Laterality: N/A;   MASS EXCISION N/A 06/29/2013   Procedure:  WIDE LOCAL EXCISION OF POSTERIOR NECK ABSCESS;  Surgeon: Ralene Ok, MD;  Location: [place];  Service: General;  Laterality: N/A;   REPAIR KNEE LIGAMENT Left    "fixed ligaments and chipped patella"   right transmetatarsal amputation       Family History  Problem Relation Age of Onset   Diabetes Mother    Hyperlipidemia Mother    Depression Mother    GER disease Mother    Allergic rhinitis Mother    Restless legs syndrome Mother    Heart attack Paternal Uncle    Heart disease Paternal Grandmother    Heart attack Paternal Grandmother    Heart attack Paternal Grandfather    Heart disease Paternal Grandfather    Heart attack Father    Migraines Sister    Cancer Maternal Grandmother        COLON   Hypertension Maternal Grandmother    Hyperlipidemia Maternal Grandmother    Diabetes Maternal Grandmother    Other Maternal Grandfather        GUN SHOT   Anxiety disorder Sister    Asthma Child     Social History:  reports that she quit smoking about 16 years ago. Her smoking use included cigarettes. She has a 3.75 pack-year smoking history. She has never  used smokeless tobacco. She reports that she does not currently use drugs. She reports that she does not drink alcohol.  Allergies:  Allergies  Allergen Reactions   Iodine Other (See Comments)    Kidney dysfunction   Kiwi Extract Shortness Of Breath, Swelling and Anaphylaxis   Morphine And Related Nausea And Vomiting   Nalbuphine Other (See Comments)    Hallucinations     Trental [Pentoxifylline] Nausea And Vomiting   Toradol [Ketorolac Tromethamine] Other (See Comments)    Feels like something is crawling on me   Morphine Nausea And Vomiting   Nubain [Nalbuphine Hcl] Other (See Comments)    "FEELS LIKE SOMETHING CRAWLING ON ME"    Medications: Reviewed  Results for orders placed or performed during the hospital encounter of 07/05/22 (from the past 48 hour(s))  CBG monitoring, ED  Status: Abnormal   Collection Time: 07/05/22  8:45 AM  Result Value Ref Range   Glucose-Capillary 295 (H) 70 - 99 mg/dL    Comment: Glucose reference range applies only to samples taken after fasting for at least 8 hours.  CBC     Status: Abnormal   Collection Time: 07/05/22  8:57 AM  Result Value Ref Range   WBC 15.2 (H) 4.0 - 10.5 K/uL   RBC 3.90 3.87 - 5.11 MIL/uL   Hemoglobin 11.6 (L) 12.0 - 15.0 g/dL   HCT 36.3 36.0 - 46.0 %   MCV 93.1 80.0 - 100.0 fL   MCH 29.7 26.0 - 34.0 pg   MCHC 32.0 30.0 - 36.0 g/dL   RDW 14.9 11.5 - 15.5 %   Platelets 358 150 - 400 K/uL   nRBC 0.0 0.0 - 0.2 %    Comment: Performed at [name], [place] [address] [place], [place] [address]  Basic metabolic panel     Status: Abnormal   Collection Time: 07/05/22  8:57 AM  Result Value Ref Range   Sodium 134 (L) 135 - 145 mmol/L   Potassium 4.9 3.5 - 5.1 mmol/L    Comment: HEMOLYSIS AT THIS LEVEL MAY AFFECT RESULT   Chloride 95 (L) 98 - 111 mmol/L   CO2 27 22 - 32 mmol/L   Glucose, Bld 312 (H) 70 - 99 mg/dL    Comment: Glucose reference range applies only to samples taken after fasting for  at least 8 hours.   BUN 47 (H) 6 - 20 mg/dL   Creatinine, Ser 1.15 (H) 0.44 - 1.00 mg/dL   Calcium 8.7 (L) 8.9 - 10.3 mg/dL   GFR, Estimated >60 >60 mL/min    Comment: (NOTE) Calculated using the CKD-EPI Creatinine Equation (2021)    Anion gap 12 5 - 15    Comment: Performed at [facility], [place] [address], [place], [place] [address]  Lactic acid, plasma     Status: None   Collection Time: 07/05/22  9:00 AM  Result Value Ref Range   Lactic Acid, Venous 1.2 0.5 - 1.9 mmol/L    Comment: Performed at [facility], [place] [address], [place], [place] [address]  I-stat chem 8, ED (not at [facility] or [facility])     Status: Abnormal   Collection Time: 07/05/22  9:05 AM  Result Value Ref Range   Sodium 134 (L) 135 - 145 mmol/L   Potassium 4.4 3.5 - 5.1 mmol/L   Chloride 96 (L) 98 - 111 mmol/L   BUN 45 (H) 6 - 20 mg/dL   Creatinine, Ser 1.30 (H) 0.44 - 1.00 mg/dL   Glucose, Bld 319 (H) 70 - 99 mg/dL    Comment: Glucose reference range applies only to samples taken after fasting for at least 8 hours.   Calcium, Ion 1.08 (L) 1.15 - 1.40 mmol/L   TCO2 30 22 - 32 mmol/L   Hemoglobin 12.6 12.0 - 15.0 g/dL   HCT 37.0 36.0 - 46.0 %   *Note: Due to a large number of results and/or encounters for the requested time period, some results have not been displayed. A complete set of results can be found in Results Review.    CT Soft Tissue Neck W Contrast  Result Date: 07/05/2022 CLINICAL DATA:  Soft tissue swelling, infection suspected, neck x-ray done. EXAM: CT NECK WITH CONTRAST TECHNIQUE: Multidetector CT imaging of the neck was performed using the standard protocol following the bolus  administration of intravenous contrast. RADIATION DOSE REDUCTION: This exam was performed according to the departmental dose-optimization program which includes automated exposure control, adjustment of the mA and/or kV according to patient size and/or use of iterative reconstruction technique.  CONTRAST:  40m OMNIPAQUE IOHEXOL 300 MG/ML  SOLN COMPARISON:  None Available. FINDINGS: Mildly motion degraded exam. Pharynx and larynx: Streak and beam hardening artifact arising from dental restoration partially obscures the oral cavity. Mucosal/submucosal edema within the left oropharynx without significant airway narrowing. No definite epiglottic/laryngeal swelling. No retropharyngeal collection. Salivary glands: Foci of gas within the left submandibular gland. Edema/inflammatory stranding surrounding the left submandibular gland and extending into the ventral upper neck. Unremarkable appearance of the parotid and right submandibular glands. Thyroid: Unremarkable. Lymph nodes: Enlarged left level 2 and 3 lymph nodes, measuring up to 19 mm in short axis (for instance as seen on series 5, image 39). Vascular: The major vascular structures of the neck are patent. Atherosclerotic plaque about the carotid bifurcations. Limited intracranial: No evidence of acute intracranial abnormality within the field of view. Visualized orbits: Excluded from the field of view. Mastoids and visualized paranasal sinuses: No significant paranasal sinus disease or mastoid effusion at the imaged levels. Skeleton: Straightening of the expected cervical lordosis. No acute fracture or aggressive osseous lesion. Upper chest: No consolidation within the imaged lung apices. IMPRESSION: Edema and inflammatory stranding surrounding the left submandibular gland consistent with acute left submandibular sialoadenitis. Edema and inflammatory stranding also extend into the ventral upper neck. There are foci of gas within the left submandibular gland, which may be secondary to infection with a gas-producing organism, or potentially related to recent positive pressure ventilation. Mucosal/submucosal edema within the left oropharynx, suggesting pharyngitis. Left-sided cervical lymphadenopathy, as described and likely reactive. Electronically Signed    By: KKellie SimmeringD.O.   On: 07/05/2022 10:39    RXNT:[id number] as listed in admit H&P  Blood pressure (!) 163/84, pulse 93, temperature 98.9 F (37.2 C), temperature source Oral, resp. rate (!) 22, last menstrual period 07/05/2022, SpO2 99 %.  PHYSICAL EXAM: Overall appearance: Morbidly obese lady, in no distress, moving air well, no stridor, no difficulty handling secretions. Head:  Normocephalic, atraumatic. Ears: External ears look healthy. Nose: External nose is healthy in appearance. Internal nasal exam free of any lesions or obstruction. Oral Cavity/Pharynx: He has trismus to about 2 cm.  The tongue and floor of mouth are free of any swelling or inflammation.  Soft palate and pharynx as visualized from the mouth appear normal.  There are no mucosal lesions or masses identified. Larynx/Hypopharynx: Deferred Neuro:  No identifiable neurologic deficits. Neck: No palpable neck masses.  There is an excess of fatty tissue diffusely around the neck.  There is no skin erythema or fluctuance and there is no induration.  She is very tender along the anterior neck.  Studies Reviewed: CT reviewed.  Procedures: none   Assessment/Plan: Based on the history and the limited examination I suspect she had an injury to the pharyngeal mucosa following intubation.  My plan was to perform fiberoptic examination of the larynx and hypopharynx but unfortunately there was no scope available at this emergency department.  [facility] my evaluation.  Based on what I know so far I suspect she should be admitted to the medical service and observed for 48 hours with n.p.o. and IV antibiotics.  SW96.75FF Medical Decision Making: #/Complex Problems: 4  Data  Reviewed:3  Management:4 (1-Straightforward, 2-Low, 3-Moderate, 4-High)   Izora Gala 07/05/2022, 12:59 PM

## 2022-07-05 NOTE — ED Notes (Signed)
Patient refused to put back on the BP cuff. RN notified.

## 2022-07-05 NOTE — ED Notes (Signed)
Spoke to [facility] ED Charge, Roselyn Reef and gave reports regarding this patient

## 2022-07-05 NOTE — ED Notes (Signed)
Pt transferred from [facility] per ENT and is awaiting a scope with ENT.

## 2022-07-05 NOTE — Assessment & Plan Note (Addendum)
Patient mentioned chest pain in the ED, resolved spontaneously. Currently hemodynamically stable. EKG 8/5 shows age indeterminate lateral infarct - new compared to previous EKG on 7/31. - continue to monitor

## 2022-07-05 NOTE — ED Provider Notes (Signed)
[facility] DEPT Provider Note   CSN: [id number] Arrival date & time: 07/05/22  2707     History  Chief Complaint  Patient presents with   Sore Throat    [name] is a 46 y.o. female with a past medical history of insulin-dependent diabetes, poorly controlled diabetes.  History of chronic hypoxic/hypercarbic respiratory failure due to obesity hypoventilation syndrome on oxygen as needed, status post transmetatarsal amputation of the right foot who presents emergency department with chief complaint of sore throat. Patient reports that she had eye surgery at [facility] 3 days ago.  She was intubated.  Patient states that when they woke her up she had a sore throat which has been progressively worsening.  She states that she has significant pain with swallowing and feels like there is something sitting in her throat she that she is unable to clear.  She has noticed increasing fullness of the area below the angle of the mandible with redness.  She states that the pain is severe.  HPI     Home Medications Prior to Admission medications   Medication Sig Start Date End Date Taking? Authorizing Provider  Accu-Chek FastClix Lancets MISC USE DAILY AS INSTRUCTED 05/18/21   [name], MD  ACCU-CHEK GUIDE test strip USE T0 CHECK BLOOD SUGAR 3 TIMES DAILY 11/05/21   [name], MD  Accu-Chek Softclix Lancets lancets Use to check blood glucose four times daily 04/17/20   Martyn Malay, MD  acetaminophen (TYLENOL) 325 MG tablet Take 1-2 tablets (325-650 mg total) by mouth every 4 (four) hours as needed for mild pain. 06/04/22   Setzer, Edman Circle, PA-C  albuterol (VENTOLIN HFA) 108 (90 Base) MCG/ACT inhaler INHALE 1 TO 2 PUFFS BY MOUTH EVERY SIX HOURS AS NEEDED FOR WHEEZING OR SHORTNESS OF BREATH Patient taking differently: Inhale 1 puff into the lungs every 6 (six) hours as needed for shortness of breath. 07/16/21   [name],  MD  amLODipine (NORVASC) 10 MG tablet Take 1 tablet (10 mg total) by mouth daily. 06/04/22   Setzer, Edman Circle, PA-C  aspirin EC 81 MG tablet Take 1 tablet (81 mg total) by mouth daily. Swallow whole. 11/19/20   Sherren Mocha, MD  cetirizine (ZYRTEC) 10 MG tablet Take 1 tablet (10 mg total) by mouth daily as needed for allergies. 02/11/22   [name], MD  Cholecalciferol 1000 units tablet Take 1 tablet (1,000 Units total) by mouth daily. 04/05/17   [name], MD  Continuous Blood Gluc Sensor (DEXCOM G6 SENSOR) MISC Inject 1 applicator into the skin as directed. Change sensor every 10 days. 11/05/21   [name], MD  Continuous Blood Gluc Transmit (DEXCOM G6 TRANSMITTER) MISC Inject 1 Device into the skin as directed. Reuse 8 times with sensor changes. 09/03/21   [name], MD  DULoxetine (CYMBALTA) 30 MG capsule Take 1 capsule (30 mg total) by mouth daily. X 1 week; then 60 mg daily- for knee and back/nerve pain 06/04/22   Setzer, Edman Circle, PA-C  gabapentin (NEURONTIN) 600 MG tablet Take 2 tablets (1,200 mg total) by mouth 2 (two) times daily. 06/04/22   Setzer, Edman Circle, PA-C  Glucagon (BAQSIMI ONE PACK) 3 MG/DOSE POWD Place 3 mg into the nose as needed (low blood sugar).    [provider]  Glucagon, rDNA, (GLUCAGON EMERGENCY) 1 MG KIT Inject 1 mg into the muscle as needed for low blood sugar. 11/03/21  [provider]  GNP ULTICARE PEN NEEDLES 32G X 4 MM MISC USE TO inject insulin 2 TIMES DAILY 10/02/21   [name], MD  insulin aspart (NOVOLOG FLEXPEN) 100 UNIT/ML FlexPen Inject 20 Units into the skin 3 (three) times daily with meals. 05/25/22   Alcus Dad, MD  insulin degludec (TRESIBA FLEXTOUCH) 200 UNIT/ML FlexTouch Pen Inject 52 Units into the skin 2 (two) times daily. 06/04/22   Setzer, Edman Circle, PA-C  nystatin (MYCOSTATIN/NYSTOP) powder Apply 1 application. topically 3 (three) times daily. 03/30/22   [name], MD   ondansetron (ZOFRAN) 4 MG tablet Take 1 tablet (4 mg total) by mouth every 8 (eight) hours as needed for nausea or vomiting. 02/17/22   Lovorn, Jinny Blossom, MD  oxyCODONE-acetaminophen (PERCOCET) 7.5-325 MG tablet Take 1 tablet by mouth every 6 (six) hours as needed for severe pain. 04/27/22   [name], MD  prednisoLONE acetate (PRED FORTE) 1 % ophthalmic suspension Place 1 drop into both eyes 4 (four) times daily. 06/04/22   Setzer, Edman Circle, PA-C  SYMBICORT 160-4.5 MCG/ACT inhaler inhale 2 PUFFS into THE lungs 2 TIMES DAILY Patient taking differently: 2 puffs every 6 (six) hours. Shortness of breath 01/04/22   [name], MD  Tiotropium Bromide Monohydrate (SPIRIVA RESPIMAT) 1.25 MCG/ACT AERS Inhale 2 puffs into the lungs daily. 11/14/20   Martyn Ehrich, NP  torsemide 40 MG TABS Take 40 mg by mouth 2 (two) times daily. 06/04/22   Setzer, Edman Circle, PA-C  traZODone (DESYREL) 50 MG tablet Take 0.5-1 tablets (25-50 mg total) by mouth at bedtime as needed for sleep. 06/04/22   Setzer, Edman Circle, PA-C  triamcinolone ointment (KENALOG) 0.5 % Apply 1 Application topically 2 (two) times daily. 06/25/22   Ganta, Anupa, DO  TRULICITY 3 XJ/8.8TG SOPN Inject 3 mg into the skin once a week. Sundays 01/04/22   [provider]      Allergies    Iodine, Kiwi extract, Morphine and related, Nalbuphine, Trental [pentoxifylline], Toradol [ketorolac tromethamine], Morphine, and Nubain [nalbuphine hcl]    Review of Systems   Review of Systems  Physical Exam Updated Vital Signs BP (!) 147/88 (BP Location: Left Wrist)   Pulse 89   Temp 98.3 F (36.8 C) (Oral)   Resp 12   LMP 07/05/2022 (Approximate)   SpO2 (!) 89%  Physical Exam Vitals and nursing note reviewed.  Constitutional:      General: She is not in acute distress.    Appearance: She is well-developed. She is not diaphoretic.     Interventions: Nasal cannula in place.  HENT:     Head: Normocephalic and atraumatic.     Right  Ear: External ear normal.     Left Ear: External ear normal.     Nose: Nose normal.     Mouth/Throat:     Mouth: Mucous membranes are moist.     Comments: 1 cm trismus- unable to assess pharynx or hypoglossal region Eyes:     General: No scleral icterus.    Extraocular Movements: Extraocular movements intact.     Conjunctiva/sclera: Conjunctivae normal.     Pupils: Pupils are equal, round, and reactive to light.  Neck:     Comments: Patient has swelling, fullness, erythema, warmth induration and exquisite tenderness of the area below her mandible. Cardiovascular:     Rate and Rhythm: Normal rate and regular rhythm.     Heart sounds: Normal heart sounds. No murmur heard.    No friction  rub. No gallop.  Pulmonary:     Effort: Pulmonary effort is normal. No respiratory distress.     Breath sounds: Normal breath sounds.  Abdominal:     General: Bowel sounds are normal. There is no distension.     Palpations: Abdomen is soft. There is no mass.     Tenderness: There is no abdominal tenderness. There is no guarding.  Musculoskeletal:     Cervical back: Normal range of motion.  Skin:    General: Skin is warm and dry.  Neurological:     Mental Status: She is alert and oriented to person, place, and time.  Psychiatric:        Behavior: Behavior normal.     ED Results / Procedures / Treatments   Labs (all labs ordered are listed, but only abnormal results are displayed) Labs Reviewed - No data to display  EKG None  Radiology No results found.  Procedures .Critical Care  Performed by: Margarita Mail, PA-C Authorized by: Margarita Mail, PA-C   Critical care provider statement:    Critical care time (minutes):  60   Critical care time was exclusive of:  Separately billable procedures and treating other patients   Critical care was necessary to treat or prevent imminent or life-threatening deterioration of the following conditions: airway compromise.   Critical care was time  spent personally by me on the following activities:  Development of treatment plan with patient or surrogate, discussions with consultants, evaluation of patient's response to treatment, examination of patient, ordering and review of laboratory studies, ordering and review of radiographic studies, ordering and performing treatments and interventions, pulse oximetry, re-evaluation of patient's condition and review of old charts     Medications Ordered in ED Medications - No data to display  ED Course/ Medical Decision Making/ A&P Clinical Course as of 07/05/22 0909  Mon Jul 05, 2022  0908 This is a 46 year old female here with sore throat and neck pain, recent surgery with intubation.  Concern that patient has deep space infection of the neck including Ludwick's angina.  May be dental in origination however given recent intubation she could have tracheal perforation or other cellulitis/abscess of the region.  I have ordered labs, pain meds and CT soft tissues of the neck for further work-up. [AH]    Clinical Course User Index [AH] Margarita Mail, PA-C                           Medical Decision Making This patient presents to the ED for concern of neck pain, this involves an extensive number of treatment options, and is a complaint that carries with it a high risk of complications and morbidity.  The differential diagnosis includes Ludwig's angina, sialoadenitis, thyroglossal duct cyst, esophageal tracheal perforation, deep space infection of the neck.  After review of all data points patient appears to have infection of the neck.  Concern for gas-forming organism versus tracheal perforation.  Also concern for sialoadenitis on the left  Co morbidities that complicate the patient evaluation       Morbid obesity, obesity hypoventilation, poorly controlled diabetes, chronic respiratory failure, high risk airway   Additional history obtained:  Additional history obtained from EMR External  records from outside source obtained and reviewed including notes from endocrinology unable to see most recent surgery   Lab Tests:  I Ordered, and personally interpreted labs.  The pertinent results include: Elevated white blood cell count, elevated blood sugars.  Imaging Studies ordered:  I ordered imaging studies including CT soft tissue neck with contrast I independently visualized and interpreted imaging which showed edema, inflammatory stranding consistent with sialoadenitis, foci of gas I agree with the radiologist interpretation   Cardiac Monitoring:       The patient was maintained on a cardiac monitor.  I personally viewed and interpreted the cardiac monitored which showed an underlying rhythm of: Sinus rhythm   Medicines ordered and prescription drug management:  I ordered medication including Dilaudid, fluids, vancomycin for neck infection and pain Reevaluation of the patient after these medicines showed that the patient improved I have reviewed the patients home medicines and have made adjustments as needed   Test Considered:    Critical Interventions:       Oxygen supplementation, broad-spectrum antibiotics   Consultations Obtained:  I requested consultation with the ear nose and throat,  and discussed lab and imaging findings as well as pertinent plan - they recommend: Admission and further management   Problem List / ED Course:       Infection of the neck   Reevaluation:  After the interventions noted above, I reevaluated the patient and found that they have :improved      Dispostion:  After consideration of the diagnostic results and the patients response to treatment, I feel that the patent would benefit from admission.     Amount and/or Complexity of Data Reviewed Labs: ordered. Radiology: ordered.  Risk Prescription drug management. Decision regarding hospitalization.           Final Clinical Impression(s) / ED  Diagnoses Final diagnoses:  Neck infection    Rx / DC Orders ED Discharge Orders     None         Margarita Mail, PA-C 07/05/22 1539    Daleen Bo, MD 07/06/22 [phone]

## 2022-07-05 NOTE — Progress Notes (Signed)
Pharmacy Antibiotic Note  [name] is a 46 y.o. female admitted on 07/05/2022 with concern for Ludwigs.  Pharmacy has been consulted for Unasyn dosing.  Plan: Unasyn 3g IV every 6 hours Monitor renal function, clinical progression and LOT  Height: 5' (152.4 cm) Weight: (!) 155 kg (341 lb 11.4 oz) IBW/kg (Calculated) : 45.5  Temp (24hrs), Avg:98.5 F (36.9 C), Min:98.3 F (36.8 C), Max:98.9 F (37.2 C)  Recent Labs  Lab 07/05/22 0857 07/05/22 0900 07/05/22 0905  WBC 15.2*  --   --   CREATININE 1.15*  --  1.30*  LATICACIDVEN  --  1.2  --     Estimated Creatinine Clearance: 77.9 mL/min (A) (by C-G formula based on SCr of 1.3 mg/dL (H)).    Allergies  Allergen Reactions   Cefepime Other (See Comments)    AKI, see records from [facility] hospitalization in January 2020.   Other reaction(s): Other (See Comments) "Shut down organs/kidneys"  Note pt has tolerated Rocephin and Keflex   Iodine Other (See Comments)    Kidney dysfunction   Kiwi Extract Shortness Of Breath, Swelling and Anaphylaxis   Morphine And Related Nausea And Vomiting   Nalbuphine Other (See Comments)    Hallucinations     Trental [Pentoxifylline] Nausea And Vomiting   Cephalosporins Other (See Comments)    Pt states that they cause her kidneys to shut down   Toradol [Ketorolac Tromethamine] Other (See Comments)    Feels like something is crawling on me   Morphine Nausea And Vomiting   Nubain [Nalbuphine Hcl] Other (See Comments)    "FEELS LIKE SOMETHING CRAWLING ON ME"    Bertis Ruddy, PharmD Clinical Pharmacist ED Pharmacist Phone # [phone] 07/05/2022 5:46 PM

## 2022-07-05 NOTE — H&P (Signed)
Critical care attending attestation note: I agree with the Advanced Practitioner's note, impression, and recommendations as outlined. I have taken an independent interval history, reviewed the chart and examined the patient. 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  ETT and OGT in place Neuro: Sedated, not following commands.  Eyes are closed.  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  Critical care was necessary to treat or prevent imminent or life-threatening deterioration.   Critical care was time spent personally by me on the following activities: development of treatment plan with patient and/or surrogate as well as nursing, discussions with consultants, evaluation of patient's response to treatment, examination of patient, obtaining history from patient or surrogate, ordering and performing treatments and interventions, ordering and review of laboratory studies, ordering and review of radiographic studies, pulse oximetry, re-evaluation of patient's condition and participation in multidisciplinary rounds.  Jacky Kindle, MD [facility] See Amion for pager If no response to pager, please call [phone] until 7pm After 7pm, Please call E-link [phone]  07/05/2022, 6:53 PM

## 2022-07-05 NOTE — Assessment & Plan Note (Addendum)
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 Admit to ICU. Re-intubated and stabilized, now extubated. Follow up CT stable. ENT, pharmacy following. WBC downtrending. SLP completed FEES study. NGT d/c'd - Re-admit to FPTS, Progressive, Attending Dr. Owens Shark - ENT following, appreciate recs - Monitor airway and resp status - NPO, hold home medications  - Continue ampicillin-sulbactam (Unasyn) 3g IV (scheduled for 13 doses) until 8/10 (for total of 10 days) - PT/OT Eval and Treat

## 2022-07-05 NOTE — Progress Notes (Signed)
Family medicine teaching service will be admitting this patient. Our pager information can be located in the physician sticky notes, treatment team sticky notes, and the headers of all our official daily progress notes.   FAMILY MEDICINE TEACHING SERVICE Patient - Please contact intern pager [phone] or text page via website [email or web address] (login: [email or web address]) for questions regarding care. DO NOT page listed attending provider unless there is no answer from the number above.   Francely Craw, MD PGY-3, [facility] pager [phone]   

## 2022-07-06 DIAGNOSIS — J391 Other abscess of pharynx: Secondary | ICD-10-CM | POA: Diagnosis not present

## 2022-07-06 LAB — POCT I-STAT 7, (LYTES, BLD GAS, ICA,H+H)
Acid-Base Excess: 2 mmol/L (ref 0.0–2.0)
Bicarbonate: 30.7 mmol/L — ABNORMAL HIGH (ref 20.0–28.0)
Calcium, Ion: 1.11 mmol/L — ABNORMAL LOW (ref 1.15–1.40)
HCT: 34 % — ABNORMAL LOW (ref 36.0–46.0)
Hemoglobin: 11.6 g/dL — ABNORMAL LOW (ref 12.0–15.0)
O2 Saturation: 100 %
Patient temperature: 98.6
Potassium: 4.5 mmol/L (ref 3.5–5.1)
Sodium: 135 mmol/L (ref 135–145)
TCO2: 33 mmol/L — ABNORMAL HIGH (ref 22–32)
pCO2 arterial: 66.6 mmHg (ref 32–48)
pH, Arterial: 7.272 — ABNORMAL LOW (ref 7.35–7.45)
pO2, Arterial: 410 mmHg — ABNORMAL HIGH (ref 83–108)

## 2022-07-06 LAB — BASIC METABOLIC PANEL
Anion gap: 12 (ref 5–15)
BUN: 49 mg/dL — ABNORMAL HIGH (ref 6–20)
CO2: 28 mmol/L (ref 22–32)
Calcium: 8.9 mg/dL (ref 8.9–10.3)
Chloride: 95 mmol/L — ABNORMAL LOW (ref 98–111)
Creatinine, Ser: 2.63 mg/dL — ABNORMAL HIGH (ref 0.44–1.00)
GFR, Estimated: 22 mL/min — ABNORMAL LOW (ref >60)
Glucose, Bld: 254 mg/dL — ABNORMAL HIGH (ref 70–99)
Potassium: 4.8 mmol/L (ref 3.5–5.1)
Sodium: 135 mmol/L (ref 135–145)

## 2022-07-06 LAB — TRIGLYCERIDES: Triglycerides: 357 mg/dL — ABNORMAL HIGH (ref <150)

## 2022-07-06 LAB — PHOSPHORUS: Phosphorus: 7 mg/dL — ABNORMAL HIGH (ref 2.5–4.6)

## 2022-07-06 LAB — CBC
HCT: 34.8 % — ABNORMAL LOW (ref 36.0–46.0)
Hemoglobin: 10.6 g/dL — ABNORMAL LOW (ref 12.0–15.0)
MCH: 29.4 pg (ref 26.0–34.0)
MCHC: 30.5 g/dL (ref 30.0–36.0)
MCV: 96.4 fL (ref 80.0–100.0)
Platelets: 351 10*3/uL (ref 150–400)
RBC: 3.61 MIL/uL — ABNORMAL LOW (ref 3.87–5.11)
RDW: 14.9 % (ref 11.5–15.5)
WBC: 15.2 10*3/uL — ABNORMAL HIGH (ref 4.0–10.5)
nRBC: 0 % (ref 0.0–0.2)

## 2022-07-06 LAB — GLUCOSE, CAPILLARY
Glucose-Capillary: 209 mg/dL — ABNORMAL HIGH (ref 70–99)
Glucose-Capillary: 268 mg/dL — ABNORMAL HIGH (ref 70–99)
Glucose-Capillary: 360 mg/dL — ABNORMAL HIGH (ref 70–99)
Glucose-Capillary: 310 mg/dL — ABNORMAL HIGH (ref 70–99)
Glucose-Capillary: 298 mg/dL — ABNORMAL HIGH (ref 70–99)
Glucose-Capillary: 241 mg/dL — ABNORMAL HIGH (ref 70–99)

## 2022-07-06 LAB — MAGNESIUM: Magnesium: 2.4 mg/dL (ref 1.7–2.4)

## 2022-07-06 MED ORDER — GATIFLOXACIN 0.5 % OP SOLN
1.0000 [drp] | Freq: Four times a day (QID) | OPHTHALMIC | Status: DC
  Administered 2022-07-06 – 2022-07-13 (×30): 1 [drp] via OPHTHALMIC
  Filled 2022-07-06: qty 2.5

## 2022-07-06 MED ORDER — BRIMONIDINE TARTRATE 0.2 % OP SOLN
1.0000 [drp] | OPHTHALMIC | Status: DC
  Administered 2022-07-06 – 2022-07-13 (×22): 1 [drp] via OPHTHALMIC
  Filled 2022-07-06: qty 5

## 2022-07-06 MED ORDER — INSULIN GLARGINE-YFGN 100 UNIT/ML ~~LOC~~ SOLN
27.0000 [IU] | Freq: Two times a day (BID) | SUBCUTANEOUS | Status: DC
  Administered 2022-07-06: 27 [IU] via SUBCUTANEOUS
  Filled 2022-07-06 (×2): qty 0.27

## 2022-07-06 MED ORDER — NOREPINEPHRINE 4 MG/250ML-% IV SOLN
2.0000 ug/min | INTRAVENOUS | Status: DC
  Filled 2022-07-06: qty 250

## 2022-07-06 MED ORDER — BRINZOLAMIDE 1 % OP SUSP
1.0000 [drp] | OPHTHALMIC | Status: DC
  Administered 2022-07-06 – 2022-07-13 (×22): 1 [drp] via OPHTHALMIC
  Filled 2022-07-06: qty 10

## 2022-07-06 MED ORDER — INSULIN GLARGINE-YFGN 100 UNIT/ML ~~LOC~~ SOLN
20.0000 [IU] | Freq: Once | SUBCUTANEOUS | Status: AC
  Administered 2022-07-06: 20 [IU] via SUBCUTANEOUS
  Filled 2022-07-06: qty 0.2

## 2022-07-06 MED ORDER — WHITE PETROLATUM EX OINT
TOPICAL_OINTMENT | CUTANEOUS | Status: DC | PRN
  Filled 2022-07-06: qty 28.35

## 2022-07-06 MED ORDER — ONDANSETRON HCL 4 MG/2ML IJ SOLN
INTRAMUSCULAR | Status: AC
  Administered 2022-07-06: 4 mg
  Filled 2022-07-06: qty 2

## 2022-07-06 MED ORDER — PREDNISOLONE ACETATE 1 % OP SUSP
1.0000 [drp] | Freq: Four times a day (QID) | OPHTHALMIC | Status: DC
  Administered 2022-07-06 – 2022-07-13 (×30): 1 [drp] via OPHTHALMIC
  Filled 2022-07-06: qty 5

## 2022-07-06 MED ORDER — INSULIN GLARGINE-YFGN 100 UNIT/ML ~~LOC~~ SOLN
50.0000 [IU] | Freq: Two times a day (BID) | SUBCUTANEOUS | Status: DC
  Administered 2022-07-06 – 2022-07-07 (×3): 50 [IU] via SUBCUTANEOUS
  Filled 2022-07-06 (×5): qty 0.5

## 2022-07-06 MED ORDER — ATROPINE SULFATE 1 % OP SOLN
1.0000 [drp] | Freq: Every day | OPHTHALMIC | Status: DC
  Administered 2022-07-06 – 2022-07-13 (×8): 1 [drp] via OPHTHALMIC
  Filled 2022-07-06: qty 2

## 2022-07-06 MED ORDER — LATANOPROST 0.005 % OP SOLN
1.0000 [drp] | Freq: Every day | OPHTHALMIC | Status: DC
  Administered 2022-07-06 – 2022-07-12 (×7): 1 [drp] via OPHTHALMIC
  Filled 2022-07-06: qty 2.5

## 2022-07-06 MED ORDER — DEXMEDETOMIDINE HCL IN NACL 400 MCG/100ML IV SOLN
0.4000 ug/kg/h | INTRAVENOUS | Status: DC
  Administered 2022-07-06: 0.6 ug/kg/h via INTRAVENOUS
  Administered 2022-07-06 – 2022-07-07 (×2): 0.8 ug/kg/h via INTRAVENOUS
  Administered 2022-07-07: 0.4 ug/kg/h via INTRAVENOUS
  Administered 2022-07-07: 0.8 ug/kg/h via INTRAVENOUS
  Administered 2022-07-08: 0.6 ug/kg/h via INTRAVENOUS
  Administered 2022-07-08 (×4): 0.8 ug/kg/h via INTRAVENOUS
  Administered 2022-07-09 (×2): 0.6 ug/kg/h via INTRAVENOUS
  Administered 2022-07-09 (×2): 0.8 ug/kg/h via INTRAVENOUS
  Administered 2022-07-10: 0.7 ug/kg/h via INTRAVENOUS
  Filled 2022-07-06 (×16): qty 100

## 2022-07-06 MED ORDER — PANTOPRAZOLE SODIUM 40 MG IV SOLR
40.0000 mg | Freq: Every day | INTRAVENOUS | Status: DC
  Administered 2022-07-06: 40 mg via INTRAVENOUS
  Filled 2022-07-06: qty 10

## 2022-07-06 MED ORDER — SODIUM CHLORIDE 0.9 % IV SOLN
250.0000 mL | INTRAVENOUS | Status: DC
  Administered 2022-07-06: 250 mL via INTRAVENOUS
  Administered 2022-07-08: 500 mL via INTRAVENOUS

## 2022-07-06 MED ORDER — NEOMYCIN-POLYMYXIN-DEXAMETH 3.5-10000-0.1 OP OINT
1.0000 | TOPICAL_OINTMENT | Freq: Every day | OPHTHALMIC | Status: DC
  Administered 2022-07-06 – 2022-07-12 (×7): 1 via OPHTHALMIC
  Filled 2022-07-06: qty 3.5

## 2022-07-06 MED ORDER — LACTATED RINGERS IV SOLN: INTRAVENOUS | Status: AC

## 2022-07-06 NOTE — Progress Notes (Signed)
  Transition of Care [facility]) Screening Note   Patient Details  Name: [name] Date of Birth: Oct 02, 1976   Transition of Care [facility]) CM/SW Contact:    Tom-Johnson, Renea Ee, RN Phone Number: 07/06/2022, 4:59 PM  Patient is admitted with pharyngeal Abscess with throat swelling. Currently intubated. On Precedex and Fentanyl.  Transition of Care Department [facility]) has reviewed patient and no TOC needs or recommendations have been identified at this time. TOC will continue to monitor patient advancement through interdisciplinary progression rounds. If new patient transition needs arise, please place a TOC consult.

## 2022-07-06 NOTE — Progress Notes (Signed)
eLink Physician-Brief Progress Note Patient Name: [name] DOB: [birth year] MRN: [id number]   Date of Service  07/06/2022  HPI/Events of Note  Patient intubated on admission due to airway difficulties related to [place] angina, initially unresponsive but with sedation reduced she is awake , following commands, and non focal in her neurological exam except for anisocoria, however she recently had eye surgery.  eICU Interventions  Anisocoria likely secondary to eye surgery, will monitor patient closely for now.        Kerry Kass Haskel [place]alt 07/06/2022, 9:05 PM

## 2022-07-06 NOTE — Consult Note (Addendum)
NAME:  [name], MRN:  [id number], DOB:  Sep 21, 1976, LOS: 1 ADMISSION DATE:  07/05/2022 CONSULTATION DATE:  07/05/2022 REFERRING MD:  Jeani Hawking - FMTS CHIEF COMPLAINT:  Ludwig's angina, compromised airway   History of Present Illness:  46 year old woman who presented to [facility] [place] 7/31 as a transfer from [facility] [place] for further evaluation of progressively worsening throat pain and dysphagia. 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 PMHx significant for HTN, HLD, acute-on-chronic CHF, CAD, COPD/asthma, OSA on CPAP, T2DM (c/b diabetic peripheral neuropathy and bilateral BKAs), GERD, bipolar disorder.  Patient initially contacted family medicine after hours phone line regarding neck soreness and "mucus" in the back of her throat causing difficulty breathing.  Also reported dysphagia with inability to take PO.  At the time of the phone call denied any blood or associated cough with throat discomfort.  Patient presented to [facility] [place] a few hours later via EMS with complaints of progressively worsening throat discomfort and dysphagia since eye surgery at [facility] 7/28.  At the time of presentation, patient was noted to have increasing fullness of the area below the angle of the mandible with associated redness and severe pain with concern for deep space infection of the neck, possibly Ludwig's angina vs. tracheal perforation.  Patient then developed trismus with concern for airway compromise.  ENT was called for evaluation.  Empiric cefepime/vanc were initiated.  Patient was transferred from [facility] [place] to [facility] [place] for further care and fiberoptic nasopharyngoscopy with ENT.  CT Neck 7/31 demonstrated edema and inflammatory stranding around the left submandibular gland, consistent with acute left submandibular sialoadenitis.  Edema and inflammatory stranding were also noted to extend into the ventral upper neck with foci of gas within the left submandibular gland, possibly secondary to infection with gas-producing organism and  associated pharyngitis.  While awaiting floor bed, patient decompensated from an airway protection standpoint, became stridorous and PCCM was consulted for admission.  On arrival to evaluate patient, anesthesia was preparing to intubate. Intubation was successful on second attempt.  Dr. Constance Holster with ENT was present at bedside during intubation, noting excessive purulent secretions but no significant swelling of the supraglottic larynx recommendation for conservative management with IV antibiotics.  PCCM admitted patient for further care.  Pertinent Medical History:   Past Medical History:  Diagnosis Date   Abscess of groin, left    Acute on chronic diastolic (congestive) heart failure ([place]) 02/05/2022   Acute on chronic respiratory failure with hypoxia ([name]) 04/11/2022   Acute osteomyelitis of ankle or foot, left ([place]) 01/31/2018   Acute respiratory failure with hypoxia and hypercarbia ([place]) 06/15/2020   Alveolar hypoventilation    Amputation stump infection ([place]) 04/09/2018   Anemia    not on iron pill   Asthma    Bipolar 2 disorder ([name])    Candidiasis of vagina 05/15/2022   Carpal tunnel syndrome on right    recurrent   Cellulitis 08/2010-08/2011   Chest pain with low risk for cardiac etiology 05/11/2021   Chronic pain    COPD (chronic obstructive pulmonary disease) ([name])    Symbicort daily and Proventil as needed   Costochondritis    De Quervain's tenosynovitis, bilateral 11/01/2015   Depression    Diabetes mellitus type II, uncontrolled 2000   Type 2, Uncontrolled.Takes Lantus daily.Fasting blood sugar runs 150   Diabetic ulcer of right foot ([facility]) 12/19/2021   Drug-seeking behavior    Elevated troponin    Exposure to mold 04/21/2020   GERD (gastroesophageal reflux disease)  takes Pantoprazole and Zantac daily   HLD (hyperlipidemia)    takes Atorvastatin daily   Hypertension    takes Lisinopril and Coreg daily   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[place]) 04/11/2019   Left shoulder pain 06/23/2019    Morbid obesity ([place])    Morbid obesity with BMI of 60.0-69.9, adult ([place]) 04/15/2021   Nocturia    Nonobstructive atherosclerosis of coronary artery 11/26/2020   11/26/20 Coronary angiogram: 1.  Mild diffuse proximal LAD stenosis with no evidence of obstructive disease (20 to 30% diffuse stenosis);  2.  Left dominant circumflex with widely patent obtuse marginal branches and left PDA branch, no significant stenoses present  3.  Nondominant RCA with mild diffuse nonobstructive stenosis    OSA on CPAP    Peripheral neuropathy    takes Gabapentin daily   Pneumonia    "walking" several yrs ago and as a baby (12/05/2018)   Pneumonia due to COVID-19 virus 04/14/2021   Primary osteoarthritis of first carpometacarpal joint of left hand 07/30/2016   Rectal fissure    Restless leg    Right carpal tunnel syndrome 09/01/2011   SVT (supraventricular tachycardia) ([place])    Syncope 02/25/2016   Thrombocytosis 04/11/2022   Urinary frequency    Urinary incontinence 10/23/2020   UTI (urinary tract infection) 04/15/2021   Varicose veins    Right medial thigh and Left leg    Significant Hospital Events: Including procedures, antibiotic start and stop dates in addition to other pertinent events   7/28 - Underwent eye surgery at [place] 7/31 Presented to [facility] [place] via EMS for progressively worsening throat pain and associated dysphagia, ENT consulted and recommended transfer to [facility] [place] for nasopharyngeal scope.While awaiting bed became stridorous with concern for airway compromise and anesthesia called to intubate. Intubated in [place]. PCCM consulted for admission and escalation of care. 8/1 - transitioned abx to Unasyn, MAP 50s > transitioned to Precedex and Fentanyl with Levophed if needed, development of AKI  Interim History / Subjective:  As above  Objective:  Blood pressure (!) 107/46, pulse 87, temperature 98.6 F (37 C), temperature source Axillary, resp. rate 13, height 5' (1.524 m), weight  106.8 kg, last menstrual period 07/05/2022, SpO2 97 %.    Vent Mode: PRVC FiO2 (%):  [40 %-100 %] 40 % Set Rate:  [18 bmp-24 bmp] 24 bmp Vt Set:  [360 mL] 360 mL PEEP:  [8 cmH20] 8 cmH20 Plateau Pressure:  [15 cmH20-24 cmH20] 24 cmH20   Intake/Output Summary (Last 24 hours) at 07/06/2022 0915 Last data filed at 07/06/2022 0800 Gross per 24 hour  Intake 2357.93 ml  Output 1450 ml  Net 907.93 ml   Filed Weights   07/05/22 1614 07/06/22 0500  Weight: (!) 155 kg 106.8 kg   Physical Examination: General: arousable to voice, oriented, comfortable-appearing, NAD HEENT: PERRL, moist mucous membranes, neck with submandibular swelling (generalized, L > R) difficult to differentiate from adipose, no erythema or pain with palpation. Neuro: Responds to verbal stimuli. Following commands intermittently. Moves all 4 extremities spontaneously.   CV: RRR, no m/g/r. PULM: Breathing even and unlabored on vent post-extubation (PEEP 8, FiO2 40%). Lung fields CTAB. GI: Obese, soft, nontender. Normoactive bowel sounds. Extremities: No significant LE edema noted. Bilateral BKAs. 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 CT Neck with edema/inflammatory stranding around the left submandibular gland, consistent with  acute left submandibular sialoadenitis, extending into the ventral upper neck with foci of gas within the left submandibular gland, possibly secondary to infection with gas-producing organism. ENT consulted for evaluation, recommended transfer to [facility] for fiberoptic nasopharyngeal evaluation. Intubated in [place] 7/31. Abx transitioned to Unasyn on 8/1.  - ENT consulted, appreciate recommendations - Continue Unasyn 3g IV q6h - Keep intubated for airway protection; will need to discuss extubation versus tracheostomy pending progress, consider on  8/2 -daily CBC w/ diff  Acute hypoxemic respiratory failure in the setting of deep soft tissue throat infection COPD OSA on CPAP - Continue full vent support (4-8cc/kg IBW) - Wean FiO2 for O2 sat > 90% - Daily WUA/SBT - VAP bundle - Bronchodilators (albuterol, DuoNeb, Brovana) - Pulmonary hygiene - PAD protocol for sedation adjusted due to low MAP: discontinue Propofol, transition to Precedex and Fentanyl for goal RASS 0 to -1 - Extubation will be limited by ongoing neck soft tissue swelling, risk of airway compromise; discuss with ENT re: extubation trial vs. trach  Acute on chronic diastolic heart failure CAD HTN HLD Echo 02/2021 with EF 55-60%, mild LVH. Hypotensive with MAP 50.  - Monitor strict I&Os - Continue ASA - Hold home amlodipine for now, resume as appropriate - Cardiac monitoring - Levophed infusion as needed with goal MAP >65, SBP >90  AKI on CKD3a Cr increased from 1.15>2.63 in last 24 hours. Likely multifactorial in the setting of Cefepime use (hx of AKI in past hospitalizations with use), low pressures, malnutrition. - daily BMP - LR 100 mL/hr x10 hrs - Hold home torsemide  Type 2 diabetes mellitus Bilateral lower extremity amputation Poorly controlled in the 200s. Home regimen includes degludec 52u BID, Novolog 20u TID. - Start Semglee 27u BID - Consider TF coverage if enteral feeding initiated - moderate SSI, goal CBG 140-180 - CBGs Q4H  Diabetic peripheral neuropathy - Hold home gabapentin, Cymbalta for now  At risk for malnutrition - Consider initiation of tube feeding  Bipolar 2 disorder - No meds noted to home med list  Best Practice: (right click and "Reselect all SmartList Selections" daily)   Diet/type: NPO w/ meds via tube DVT prophylaxis: Heparin 5000u q8h GI prophylaxis: PPI Lines: N/A Foley:  N/A Code Status:  full code Last date of multidisciplinary goals of care discussion [Pending]  Labs:  CBC: Recent Labs  Lab  07/05/22 0857 07/05/22 0905 07/05/22 1659 07/05/22 1944 07/06/22 0518 07/06/22 0654  WBC 15.2*  --   --  16.0*  --  15.2*  NEUTROABS  --   --   --  11.8*  --   --   HGB 11.6* 12.6 11.9* 10.4* 11.6* 10.6*  HCT 36.3 37.0 35.0* 32.7* 34.0* 34.8*  MCV 93.1  --   --  92.4  --  96.4  PLT 358  --   --  350  --  283   Basic Metabolic Panel: Recent Labs  Lab 07/05/22 0857 07/05/22 0905 07/05/22 1659 07/05/22 1944 07/06/22 0518 07/06/22 0654  NA 134* 134* 135 134* 135 135  K 4.9 4.4 4.7 4.7 4.5 4.8  CL 95* 96*  --  98  --  95*  CO2 27  --   --  26  --  28  GLUCOSE 312* 319*  --  322*  --  254*  BUN 47* 45*  --  44*  --  49*  CREATININE 1.15* 1.30*  --  1.53*  --  2.63*  CALCIUM 8.7*  --   --  8.5*  --  8.9  MG  --   --   --  2.2  --  2.4  PHOS  --   --   --  4.5  --  7.0*   GFR: Estimated Creatinine Clearance: 30.2 mL/min (A) (by C-G formula based on SCr of 2.63 mg/dL (H)). Recent Labs  Lab 07/05/22 0857 07/05/22 0900 07/05/22 1944 07/06/22 0654  WBC 15.2*  --  16.0* 15.2*  LATICACIDVEN  --  1.2 1.0  --    Liver Function Tests: Recent Labs  Lab 07/05/22 1944  AST 8*  ALT 13  ALKPHOS 113  BILITOT 1.1  PROT 7.9  ALBUMIN 2.8*   No results for input(s): "LIPASE", "AMYLASE" in the last 168 hours. No results for input(s): "AMMONIA" in the last 168 hours.  ABG:    Component Value Date/Time   PHART 7.272 (L) 07/06/2022 0518   PCO2ART 66.6 (HH) 07/06/2022 0518   PO2ART 410 (H) 07/06/2022 0518   HCO3 30.7 (H) 07/06/2022 0518   TCO2 33 (H) 07/06/2022 0518   ACIDBASEDEF 5.6 (H) 10/21/2021 1126   O2SAT 100 07/06/2022 0518   Coagulation Profile: No results for input(s): "INR", "PROTIME" in the last 168 hours.  Cardiac Enzymes: No results for input(s): "CKTOTAL", "CKMB", "CKMBINDEX", "TROPONINI" in the last 168 hours.  HbA1C: HbA1c POC (<> result, manual entry)  Date/Time Value Ref Range Status  09/29/2021 10:46 AM >15.0 4.0 - 5.6 % Final  04/03/2020 10:25 AM  >15.0 4.0 - 5.6 % Final   Hgb A1c MFr Bld  Date/Time Value Ref Range Status  05/16/2022 01:43 AM 14.9 (H) 4.8 - 5.6 % Final    Comment:    (NOTE) Pre diabetes:          5.7%-6.4%  Diabetes:              >6.4%  Glycemic control for   <7.0% adults with diabetes   12/19/2021 08:00 AM >15.5 (H) 4.8 - 5.6 % Final    Comment:    (NOTE) **Verified by repeat analysis**         Prediabetes: 5.7 - 6.4         Diabetes: >6.4         Glycemic control for adults with diabetes: <7.0    CBG: Recent Labs  Lab 07/05/22 0845 07/05/22 1907 07/05/22 2322 07/06/22 0304 07/06/22 0823  GLUCAP 295* 307* 250* 209* 268*   Review of Systems:   Patient is encephalopathic and/or intubated. Therefore history has been obtained from chart review.   Past Medical History:  She,  has a past medical history of Abscess of groin, left, Acute on chronic diastolic (congestive) heart failure ([place]) (02/05/2022), Acute on chronic respiratory failure with hypoxia ([place]) (04/11/2022), Acute osteomyelitis of ankle or foot, left ([place]) (01/31/2018), Acute respiratory failure with hypoxia and hypercarbia ([place]) (06/15/2020), Alveolar hypoventilation, Amputation stump infection ([place]) (04/09/2018), Anemia, Asthma, Bipolar 2 disorder ([place]), Candidiasis of vagina (05/15/2022), Carpal tunnel syndrome on right, Cellulitis (08/2010-08/2011), Chest pain with low risk for cardiac etiology (05/11/2021), Chronic pain, COPD (chronic obstructive pulmonary disease) ([place]), Costochondritis, De Quervain's tenosynovitis, bilateral (11/01/2015), Depression, Diabetes mellitus type II, uncontrolled (2000), Diabetic ulcer of right foot ([place]) (12/19/2021), Drug-seeking behavior, Elevated troponin, Exposure to mold (04/21/2020), GERD (gastroesophageal reflux disease), HLD (hyperlipidemia), Hypertension, 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[place]) (04/11/2019), Left shoulder pain (06/23/2019), Morbid obesity ([place]), Morbid obesity with BMI of 60.0-69.9, adult ([place]) (04/15/2021),  Nocturia, Nonobstructive atherosclerosis of coronary artery (11/26/2020), OSA on CPAP, Peripheral neuropathy, Pneumonia,  Pneumonia due to COVID-19 virus (04/14/2021), Primary osteoarthritis of first carpometacarpal joint of left hand (07/30/2016), Rectal fissure, Restless leg, Right carpal tunnel syndrome (09/01/2011), SVT (supraventricular tachycardia) ([place]), Syncope (02/25/2016), Thrombocytosis (04/11/2022), Urinary frequency, Urinary incontinence (10/23/2020), UTI (urinary tract infection) (04/15/2021), and Varicose veins.   Surgical History:   Past Surgical History:  Procedure Laterality Date   AMPUTATION Left 02/01/2018   Procedure: LEFT FOURTH AND 5TH TOE RAY AMPUTATION;  Surgeon: [name], MD;  Location: [place];  Service: Orthopedics;  Laterality: Left;   AMPUTATION Left 03/03/2018   Procedure: LEFT BELOW KNEE AMPUTATION;  Surgeon: [name], MD;  Location: [place];  Service: Orthopedics;  Laterality: Left;   AMPUTATION Right 05/21/2022   Procedure: REVISION AMPUTATION RAY 1ST;  Surgeon: [name], DPM;  Location: [place];  Service: Podiatry;  Laterality: Right;   CARPAL TUNNEL RELEASE Bilateral    CESAREAN SECTION  2007   CORONARY ANGIOGRAPHY N/A 11/26/2020   Procedure: CORONARY ANGIOGRAPHY;  Surgeon: Sherren Mocha, MD;  Location: [place] CV [facility];  Service: Cardiovascular;  Laterality: N/A;   FOOT AMPUTATION Right    INCISION AND DRAINAGE ABSCESS Left 10/16/2021   Procedure: INCISION AND DRAINAGE ABSCESS;  Surgeon: Dwan Bolt, MD;  Location: [place];  Service: General;  Laterality: Left;   INCISION AND DRAINAGE PERIRECTAL ABSCESS Left 05/18/2019   Procedure: IRRIGATION AND DEBRIDEMENT OF PANNIS ABSCESS, POSSIBLE DEBRIDEMENT OF BUTTOCK WOUND;  Surgeon: Donnie Mesa, MD;  Location: [place];  Service: General;  Laterality: Left;   IRRIGATION AND DEBRIDEMENT BUTTOCKS Left 05/17/2019   Procedure: IRRIGATION AND DEBRIDEMENT BUTTOCKS;  Surgeon: Donnie Mesa, MD;  Location: [place];   Service: General;  Laterality: Left;   KNEE ARTHROSCOPY Right 07/17/2010   LEFT HEART CATHETERIZATION WITH CORONARY ANGIOGRAM N/A 07/27/2012   Procedure: LEFT HEART CATHETERIZATION WITH CORONARY ANGIOGRAM;  Surgeon: Sherren Mocha, MD;  Location: [facility] CATH LAB;  Service: Cardiovascular;  Laterality: N/A;   MASS EXCISION N/A 06/29/2013   Procedure:  WIDE LOCAL EXCISION OF POSTERIOR NECK ABSCESS;  Surgeon: Ralene Ok, MD;  Location: [place];  Service: General;  Laterality: N/A;   REPAIR KNEE LIGAMENT Left    "fixed ligaments and chipped patella"   right transmetatarsal amputation      Social History:   reports that she quit smoking about 16 years ago. Her smoking use included cigarettes. She has a 3.75 pack-year smoking history. She has never used smokeless tobacco. She reports that she does not currently use drugs. She reports that she does not drink alcohol.   Family History:  Her family history includes Allergic rhinitis in her mother; Anxiety disorder in her sister; Asthma in her child; Cancer in her maternal grandmother; Depression in her mother; Diabetes in her maternal grandmother and mother; GER disease in her mother; Heart attack in her father, paternal grandfather, paternal grandmother, and paternal uncle; Heart disease in her paternal grandfather and paternal grandmother; Hyperlipidemia in her maternal grandmother and mother; Hypertension in her maternal grandmother; Migraines in her sister; Other in her maternal grandfather; Restless legs syndrome in her mother.   Allergies: Allergies  Allergen Reactions   Cefepime Other (See Comments)    AKI, see records from [place] hospitalization in January 2020.   Other reaction(s): Other (See Comments) "Shut down organs/kidneys"  Note pt has tolerated Rocephin and Keflex   Iodine Other (See Comments)    Kidney dysfunction   Kiwi Extract Shortness Of Breath, Swelling and Anaphylaxis   Morphine And Related Nausea  And Vomiting   Nalbuphine  Other (See Comments)    Hallucinations     Trental [Pentoxifylline] Nausea And Vomiting   Cephalosporins Other (See Comments)    Pt states that they cause her kidneys to shut down   Toradol [Ketorolac Tromethamine] Other (See Comments)    Feels like something is crawling on me   Morphine Nausea And Vomiting   Nubain [Nalbuphine Hcl] Other (See Comments)    "FEELS LIKE SOMETHING CRAWLING ON ME"    Home Medications: Prior to Admission medications   Medication Sig Start Date End Date Taking? Authorizing Provider  acetaminophen (TYLENOL) 325 MG tablet Take 1-2 tablets (325-650 mg total) by mouth every 4 (four) hours as needed for mild pain. 06/04/22  Yes Setzer, Edman Circle, PA-C  albuterol (VENTOLIN HFA) 108 (90 Base) MCG/ACT inhaler INHALE 1 TO 2 PUFFS BY MOUTH EVERY SIX HOURS AS NEEDED FOR WHEEZING OR SHORTNESS OF BREATH Patient taking differently: Inhale 1 puff into the lungs every 6 (six) hours as needed for shortness of breath. 07/16/21  Yes [name], MD  amLODipine (NORVASC) 10 MG tablet Take 1 tablet (10 mg total) by mouth daily. 06/04/22  Yes Setzer, Edman Circle, PA-C  aspirin EC 81 MG tablet Take 1 tablet (81 mg total) by mouth daily. Swallow whole. 11/19/20  Yes Sherren Mocha, MD  atropine 1 % ophthalmic solution Place 1 drop into the right eye daily.   Yes [provider]  Brinzolamide-Brimonidine (SIMBRINZA) 1-0.2 % SUSP Place 1 drop into the left eye in the morning, at noon, and at bedtime.   Yes [provider]  cetirizine (ZYRTEC) 10 MG tablet Take 1 tablet (10 mg total) by mouth daily as needed for allergies. 02/11/22  Yes [name], MD  Cholecalciferol 1000 units tablet Take 1 tablet (1,000 Units total) by mouth daily. 04/05/17  Yes [name], MD  DULoxetine (CYMBALTA) 30 MG capsule Take 1 capsule (30 mg total) by mouth daily. X 1 week; then 60 mg daily- for knee and back/nerve pain Patient taking differently: Take 60 mg by mouth daily.  06/04/22  Yes Setzer, Edman Circle, PA-C  gabapentin (NEURONTIN) 600 MG tablet Take 2 tablets (1,200 mg total) by mouth 2 (two) times daily. 06/04/22  Yes Setzer, Edman Circle, PA-C  insulin aspart (NOVOLOG FLEXPEN) 100 UNIT/ML FlexPen Inject 20 Units into the skin 3 (three) times daily with meals. 05/25/22  Yes Alcus Dad, MD  insulin degludec (TRESIBA FLEXTOUCH) 200 UNIT/ML FlexTouch Pen Inject 52 Units into the skin 2 (two) times daily. 06/04/22  Yes Setzer, Edman Circle, PA-C  latanoprost (XALATAN) 0.005 % ophthalmic solution Place 1 drop into the left eye at bedtime. 06/29/22  Yes [provider]  moxifloxacin (VIGAMOX) 0.5 % ophthalmic solution Place 1 drop into the right eye 4 (four) times daily.   Yes [provider]  neomycin-polymyxin b-dexamethasone (MAXITROL) 3.5-10000-0.1 OINT Place 1 Application into the right eye at bedtime.   Yes [provider]  nystatin (MYCOSTATIN/NYSTOP) powder Apply 1 application. topically 3 (three) times daily. 03/30/22  Yes [name], MD  ondansetron (ZOFRAN) 4 MG tablet Take 1 tablet (4 mg total) by mouth every 8 (eight) hours as needed for nausea or vomiting. 02/17/22  Yes Lovorn, Jinny Blossom, MD  oxyCODONE-acetaminophen (PERCOCET) 7.5-325 MG tablet Take 1 tablet by mouth every 6 (six) hours as needed for severe pain. 04/27/22  Yes [name], MD  prednisoLONE acetate (PRED FORTE) 1 % ophthalmic suspension Place 1 drop  into both eyes 4 (four) times daily. Patient taking differently: Place 1 drop into the right eye 4 (four) times daily. 06/04/22  Yes Setzer, Edman Circle, PA-C  SYMBICORT 160-4.5 MCG/ACT inhaler inhale 2 PUFFS into THE lungs 2 TIMES DAILY Patient taking differently: 2 puffs every 6 (six) hours. Shortness of breath 01/04/22  Yes Hensel, Jamal Collin, MD  Tiotropium Bromide Monohydrate (SPIRIVA RESPIMAT) 1.25 MCG/ACT AERS Inhale 2 puffs into the lungs daily. 11/14/20  Yes Martyn Ehrich, NP  torsemide 40 MG TABS Take 40 mg  by mouth 2 (two) times daily. 06/04/22  Yes Setzer, Edman Circle, PA-C  traZODone (DESYREL) 50 MG tablet Take 0.5-1 tablets (25-50 mg total) by mouth at bedtime as needed for sleep. 06/04/22  Yes Setzer, Edman Circle, PA-C  triamcinolone ointment (KENALOG) 0.5 % Apply 1 Application topically 2 (two) times daily. 06/25/22  Yes Ganta, Anupa, DO  Accu-Chek FastClix Lancets MISC USE DAILY AS INSTRUCTED 05/18/21   [name], MD  ACCU-CHEK GUIDE test strip USE T0 CHECK BLOOD SUGAR 3 TIMES DAILY 11/05/21   [name], MD  Accu-Chek Softclix Lancets lancets Use to check blood glucose four times daily 04/17/20   Martyn Malay, MD  Continuous Blood Gluc Sensor (DEXCOM G6 SENSOR) MISC Inject 1 applicator into the skin as directed. Change sensor every 10 days. 11/05/21   [name], MD  Continuous Blood Gluc Transmit (DEXCOM G6 TRANSMITTER) MISC Inject 1 Device into the skin as directed. Reuse 8 times with sensor changes. 09/03/21   [name], MD  Glucagon (BAQSIMI ONE PACK) 3 MG/DOSE POWD Place 3 mg into the nose as needed (low blood sugar).    [provider]  Glucagon, rDNA, (GLUCAGON EMERGENCY) 1 MG KIT Inject 1 mg into the muscle as needed for low blood sugar. 11/03/21   [provider]  GNP ULTICARE PEN NEEDLES 32G X 4 MM MISC USE TO inject insulin 2 TIMES DAILY 10/02/21   [name], MD    Critical care time: 30 minutes   The patient is critically ill with multiple organ system failure and requires high complexity decision making for assessment and support, frequent evaluation and titration of therapies, advanced monitoring, review of radiographic studies and interpretation of complex data.   Critical Care Time devoted to patient care services, exclusive of separately billable procedures, described in this note is 30 minutes.  Wells Guiles, DO 07/06/2022, 9:15 AM PGY-2, [facility]   Please see [email or web address] for pager  details.  From 7A-7P if no response, please call [phone] After hours, please call ELink [phone]

## 2022-07-06 NOTE — Progress Notes (Deleted)
NAME:  [name], MRN:  [id number], DOB:  Sep 21, 1976, LOS: 1 ADMISSION DATE:  07/05/2022 CONSULTATION DATE:  07/05/2022 REFERRING MD:  Jeani Hawking - FMTS CHIEF COMPLAINT:  Ludwig's angina, compromised airway   History of Present Illness:  46 year old woman who presented to [facility] [place] 7/31 as a transfer from [facility] [place] for further evaluation of progressively worsening throat pain and dysphagia. 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 PMHx significant for HTN, HLD, acute-on-chronic CHF, CAD, COPD/asthma, OSA on CPAP, T2DM (c/b diabetic peripheral neuropathy and bilateral BKAs), GERD, bipolar disorder.  Patient initially contacted family medicine after hours phone line regarding neck soreness and "mucus" in the back of her throat causing difficulty breathing.  Also reported dysphagia with inability to take PO.  At the time of the phone call denied any blood or associated cough with throat discomfort.  Patient presented to [facility] [place] a few hours later via EMS with complaints of progressively worsening throat discomfort and dysphagia since eye surgery at [facility] 7/28.  At the time of presentation, patient was noted to have increasing fullness of the area below the angle of the mandible with associated redness and severe pain with concern for deep space infection of the neck, possibly Ludwig's angina vs. tracheal perforation.  Patient then developed trismus with concern for airway compromise.  ENT was called for evaluation.  Empiric cefepime/vanc were initiated.  Patient was transferred from [facility] [place] to [facility] [place] for further care and fiberoptic nasopharyngoscopy with ENT.  CT Neck 7/31 demonstrated edema and inflammatory stranding around the left submandibular gland, consistent with acute left submandibular sialoadenitis.  Edema and inflammatory stranding were also noted to extend into the ventral upper neck with foci of gas within the left submandibular gland, possibly secondary to infection with gas-producing organism and  associated pharyngitis.  While awaiting floor bed, patient decompensated from an airway protection standpoint, became stridorous and PCCM was consulted for admission.  On arrival to evaluate patient, anesthesia was preparing to intubate. Intubation was successful on second attempt.  Dr. Constance Holster with ENT was present at bedside during intubation, noting excessive purulent secretions but no significant swelling of the supraglottic larynx recommendation for conservative management with IV antibiotics.  PCCM admitted patient for further care.  Pertinent Medical History:   Past Medical History:  Diagnosis Date   Abscess of groin, left    Acute on chronic diastolic (congestive) heart failure ([place]) 02/05/2022   Acute on chronic respiratory failure with hypoxia ([name]) 04/11/2022   Acute osteomyelitis of ankle or foot, left ([place]) 01/31/2018   Acute respiratory failure with hypoxia and hypercarbia ([place]) 06/15/2020   Alveolar hypoventilation    Amputation stump infection ([place]) 04/09/2018   Anemia    not on iron pill   Asthma    Bipolar 2 disorder ([name])    Candidiasis of vagina 05/15/2022   Carpal tunnel syndrome on right    recurrent   Cellulitis 08/2010-08/2011   Chest pain with low risk for cardiac etiology 05/11/2021   Chronic pain    COPD (chronic obstructive pulmonary disease) ([name])    Symbicort daily and Proventil as needed   Costochondritis    De Quervain's tenosynovitis, bilateral 11/01/2015   Depression    Diabetes mellitus type II, uncontrolled 2000   Type 2, Uncontrolled.Takes Lantus daily.Fasting blood sugar runs 150   Diabetic ulcer of right foot ([facility]) 12/19/2021   Drug-seeking behavior    Elevated troponin    Exposure to mold 04/21/2020   GERD (gastroesophageal reflux disease)  takes Pantoprazole and Zantac daily   HLD (hyperlipidemia)    takes Atorvastatin daily   Hypertension    takes Lisinopril and Coreg daily   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[place]) 04/11/2019   Left shoulder pain 06/23/2019    Morbid obesity ([place])    Morbid obesity with BMI of 60.0-69.9, adult ([place]) 04/15/2021   Nocturia    Nonobstructive atherosclerosis of coronary artery 11/26/2020   11/26/20 Coronary angiogram: 1.  Mild diffuse proximal LAD stenosis with no evidence of obstructive disease (20 to 30% diffuse stenosis);  2.  Left dominant circumflex with widely patent obtuse marginal branches and left PDA branch, no significant stenoses present  3.  Nondominant RCA with mild diffuse nonobstructive stenosis    OSA on CPAP    Peripheral neuropathy    takes Gabapentin daily   Pneumonia    "walking" several yrs ago and as a baby (12/05/2018)   Pneumonia due to COVID-19 virus 04/14/2021   Primary osteoarthritis of first carpometacarpal joint of left hand 07/30/2016   Rectal fissure    Restless leg    Right carpal tunnel syndrome 09/01/2011   SVT (supraventricular tachycardia) ([place])    Syncope 02/25/2016   Thrombocytosis 04/11/2022   Urinary frequency    Urinary incontinence 10/23/2020   UTI (urinary tract infection) 04/15/2021   Varicose veins    Right medial thigh and Left leg    Significant Hospital Events: Including procedures, antibiotic start and stop dates in addition to other pertinent events   7/28 - Underwent eye surgery at [facility] 7/31 Presented to [facility] [place] via EMS for progressively worsening throat pain and associated dysphagia, ENT consulted and recommended transfer to [facility] [place] for nasopharyngeal scope.While awaiting bed became stridorous with concern for airway compromise and anesthesia called to intubate. Intubated in [place]. PCCM consulted for admission and escalation of care. 8/1 - transitioned abx to Unasyn, MAP 50s > transitioned to Precedex and Fentanyl with Levophed if needed, development of AKI  Interim History / Subjective:  As above  Objective:  Blood pressure (!) 93/54, pulse 83, temperature 98.6 F (37 C), temperature source Axillary, resp. rate (!) 24, height 5' (1.524 m), weight  106.8 kg, last menstrual period 07/05/2022, SpO2 94 %.    Vent Mode: PCV FiO2 (%):  [30 %-100 %] 30 % Set Rate:  [18 bmp-24 bmp] 24 bmp Vt Set:  [360 mL] 360 mL PEEP:  [8 cmH20] 8 cmH20 Plateau Pressure:  [15 cmH20-24 cmH20] 22 cmH20   Intake/Output Summary (Last 24 hours) at 07/06/2022 1041 Last data filed at 07/06/2022 0800 Gross per 24 hour  Intake 2357.93 ml  Output 1450 ml  Net 907.93 ml    Filed Weights   07/05/22 1614 07/06/22 0500  Weight: (!) 155 kg 106.8 kg   Physical Examination: General: arousable to voice, oriented, comfortable-appearing, NAD HEENT: PERRL, moist mucous membranes, neck with submandibular swelling (generalized, L > R) difficult to differentiate from adipose, no erythema or pain with palpation. Neuro: Responds to verbal stimuli. Following commands intermittently. Moves all 4 extremities spontaneously.   CV: RRR, no m/g/r. PULM: Breathing even and unlabored on vent post-extubation (PEEP 8, FiO2 40%). Lung fields CTAB. GI: Obese, soft, nontender. Normoactive bowel sounds. Extremities: No significant LE edema noted. Bilateral BKAs. 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 CT Neck with edema/inflammatory stranding around the left submandibular gland,  consistent with acute left submandibular sialoadenitis, extending into the ventral upper neck with foci of gas within the left submandibular gland, possibly secondary to infection with gas-producing organism. ENT consulted for evaluation, recommended transfer to [facility] for fiberoptic nasopharyngeal evaluation. Intubated in [place] 7/31. Abx transitioned to Unasyn on 8/1.  - ENT consulted, appreciate recommendations - Continue Unasyn 3g IV q6h - Keep intubated for airway protection; will need to discuss extubation versus tracheostomy pending progress, consider on  8/2 -daily CBC w/ diff  Acute hypoxemic respiratory failure in the setting of deep soft tissue throat infection COPD OSA on CPAP - Continue full vent support (4-8cc/kg IBW) - Wean FiO2 for O2 sat > 90% - Daily WUA/SBT - VAP bundle - Bronchodilators (albuterol, DuoNeb, Brovana) - Pulmonary hygiene - PAD protocol for sedation adjusted due to low MAP: discontinue Propofol, transition to Precedex and Fentanyl for goal RASS 0 to -1 - Extubation will be limited by ongoing neck soft tissue swelling, risk of airway compromise; discuss with ENT re: extubation trial vs. trach  Acute on chronic diastolic heart failure CAD HTN HLD Echo 02/2021 with EF 55-60%, mild LVH. Hypotensive with MAP 50.  - Monitor strict I&Os - Continue ASA - Hold home amlodipine for now, resume as appropriate - Cardiac monitoring - Levophed infusion as needed with goal MAP >65, SBP >90  AKI on CKD3a Cr increased from 1.15>2.63 in last 24 hours. Likely multifactorial in the setting of Cefepime use (hx of AKI in past hospitalizations with use), low pressures, malnutrition. - daily BMP - LR 100 mL/hr x10 hrs - Hold home torsemide  Type 2 diabetes mellitus Bilateral lower extremity amputation Poorly controlled in the 200s. Home regimen includes degludec 52u BID, Novolog 20u TID. - Start Semglee 27u BID - Consider TF coverage if enteral feeding initiated - moderate SSI, goal CBG 140-180 - CBGs Q4H  Diabetic peripheral neuropathy - Hold home gabapentin, Cymbalta for now  At risk for malnutrition - Consider initiation of tube feeding  Bipolar 2 disorder - No meds noted to home med list  Best Practice: (right click and "Reselect all SmartList Selections" daily)   Diet/type: NPO w/ meds via tube DVT prophylaxis: Heparin 5000u q8h GI prophylaxis: PPI Lines: N/A Foley:  N/A Code Status:  full code Last date of multidisciplinary goals of care discussion [Pending]  Labs:  CBC: Recent Labs  Lab  07/05/22 0857 07/05/22 0905 07/05/22 1659 07/05/22 1944 07/06/22 0518 07/06/22 0654  WBC 15.2*  --   --  16.0*  --  15.2*  NEUTROABS  --   --   --  11.8*  --   --   HGB 11.6* 12.6 11.9* 10.4* 11.6* 10.6*  HCT 36.3 37.0 35.0* 32.7* 34.0* 34.8*  MCV 93.1  --   --  92.4  --  96.4  PLT 358  --   --  350  --  742    Basic Metabolic Panel: Recent Labs  Lab 07/05/22 0857 07/05/22 0905 07/05/22 1659 07/05/22 1944 07/06/22 0518 07/06/22 0654  NA 134* 134* 135 134* 135 135  K 4.9 4.4 4.7 4.7 4.5 4.8  CL 95* 96*  --  98  --  95*  CO2 27  --   --  26  --  28  GLUCOSE 312* 319*  --  322*  --  254*  BUN 47* 45*  --  44*  --  49*  CREATININE 1.15* 1.30*  --  1.53*  --  2.63*  CALCIUM 8.7*  --   --  8.5*  --  8.9  MG  --   --   --  2.2  --  2.4  PHOS  --   --   --  4.5  --  7.0*    GFR: Estimated Creatinine Clearance: 30.2 mL/min (A) (by C-G formula based on SCr of 2.63 mg/dL (H)). Recent Labs  Lab 07/05/22 0857 07/05/22 0900 07/05/22 1944 07/06/22 0654  WBC 15.2*  --  16.0* 15.2*  LATICACIDVEN  --  1.2 1.0  --     Liver Function Tests: Recent Labs  Lab 07/05/22 1944  AST 8*  ALT 13  ALKPHOS 113  BILITOT 1.1  PROT 7.9  ALBUMIN 2.8*    No results for input(s): "LIPASE", "AMYLASE" in the last 168 hours. No results for input(s): "AMMONIA" in the last 168 hours.  ABG:    Component Value Date/Time   PHART 7.272 (L) 07/06/2022 0518   PCO2ART 66.6 (HH) 07/06/2022 0518   PO2ART 410 (H) 07/06/2022 0518   HCO3 30.7 (H) 07/06/2022 0518   TCO2 33 (H) 07/06/2022 0518   ACIDBASEDEF 5.6 (H) 10/21/2021 1126   O2SAT 100 07/06/2022 0518   Coagulation Profile: No results for input(s): "INR", "PROTIME" in the last 168 hours.  Cardiac Enzymes: No results for input(s): "CKTOTAL", "CKMB", "CKMBINDEX", "TROPONINI" in the last 168 hours.  HbA1C: HbA1c POC (<> result, manual entry)  Date/Time Value Ref Range Status  09/29/2021 10:46 AM >15.0 4.0 - 5.6 % Final  04/03/2020  10:25 AM >15.0 4.0 - 5.6 % Final   Hgb A1c MFr Bld  Date/Time Value Ref Range Status  05/16/2022 01:43 AM 14.9 (H) 4.8 - 5.6 % Final    Comment:    (NOTE) Pre diabetes:          5.7%-6.4%  Diabetes:              >6.4%  Glycemic control for   <7.0% adults with diabetes   12/19/2021 08:00 AM >15.5 (H) 4.8 - 5.6 % Final    Comment:    (NOTE) **Verified by repeat analysis**         Prediabetes: 5.7 - 6.4         Diabetes: >6.4         Glycemic control for adults with diabetes: <7.0    CBG: Recent Labs  Lab 07/05/22 0845 07/05/22 1907 07/05/22 2322 07/06/22 0304 07/06/22 0823  GLUCAP 295* 307* 250* 209* 268*    Review of Systems:   Patient is encephalopathic and/or intubated. Therefore history has been obtained from chart review.   Past Medical History:  She,  has a past medical history of Abscess of groin, left, Acute on chronic diastolic (congestive) heart failure ([place]) (02/05/2022), Acute on chronic respiratory failure with hypoxia ([place]) (04/11/2022), Acute osteomyelitis of ankle or foot, left ([place]) (01/31/2018), Acute respiratory failure with hypoxia and hypercarbia ([place]) (06/15/2020), Alveolar hypoventilation, Amputation stump infection ([place]) (04/09/2018), Anemia, Asthma, Bipolar 2 disorder ([place]), Candidiasis of vagina (05/15/2022), Carpal tunnel syndrome on right, Cellulitis (08/2010-08/2011), Chest pain with low risk for cardiac etiology (05/11/2021), Chronic pain, COPD (chronic obstructive pulmonary disease) ([place]), Costochondritis, De Quervain's tenosynovitis, bilateral (11/01/2015), Depression, Diabetes mellitus type II, uncontrolled (2000), Diabetic ulcer of right foot ([place]) (12/19/2021), Drug-seeking behavior, Elevated troponin, Exposure to mold (04/21/2020), GERD (gastroesophageal reflux disease), HLD (hyperlipidemia), Hypertension, 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[place]) (04/11/2019), Left shoulder pain (06/23/2019), Morbid obesity ([place]), Morbid obesity with BMI of 60.0-69.9, adult ([place])  (04/15/2021), Nocturia, Nonobstructive atherosclerosis of coronary artery (11/26/2020), OSA on  CPAP, Peripheral neuropathy, Pneumonia, Pneumonia due to COVID-19 virus (04/14/2021), Primary osteoarthritis of first carpometacarpal joint of left hand (07/30/2016), Rectal fissure, Restless leg, Right carpal tunnel syndrome (09/01/2011), SVT (supraventricular tachycardia) ([place]), Syncope (02/25/2016), Thrombocytosis (04/11/2022), Urinary frequency, Urinary incontinence (10/23/2020), UTI (urinary tract infection) (04/15/2021), and Varicose veins.   Surgical History:   Past Surgical History:  Procedure Laterality Date   AMPUTATION Left 02/01/2018   Procedure: LEFT FOURTH AND 5TH TOE RAY AMPUTATION;  Surgeon: [name], MD;  Location: [place];  Service: Orthopedics;  Laterality: Left;   AMPUTATION Left 03/03/2018   Procedure: LEFT BELOW KNEE AMPUTATION;  Surgeon: [name], MD;  Location: [place];  Service: Orthopedics;  Laterality: Left;   AMPUTATION Right 05/21/2022   Procedure: REVISION AMPUTATION RAY 1ST;  Surgeon: [name], DPM;  Location: [place];  Service: Podiatry;  Laterality: Right;   CARPAL TUNNEL RELEASE Bilateral    CESAREAN SECTION  2007   CORONARY ANGIOGRAPHY N/A 11/26/2020   Procedure: CORONARY ANGIOGRAPHY;  Surgeon: Sherren Mocha, MD;  Location: [place] [facility];  Service: Cardiovascular;  Laterality: N/A;   FOOT AMPUTATION Right    INCISION AND DRAINAGE ABSCESS Left 10/16/2021   Procedure: INCISION AND DRAINAGE ABSCESS;  Surgeon: Dwan Bolt, MD;  Location: [place];  Service: General;  Laterality: Left;   INCISION AND DRAINAGE PERIRECTAL ABSCESS Left 05/18/2019   Procedure: IRRIGATION AND DEBRIDEMENT OF PANNIS ABSCESS, POSSIBLE DEBRIDEMENT OF BUTTOCK WOUND;  Surgeon: Donnie Mesa, MD;  Location: [place];  Service: General;  Laterality: Left;   IRRIGATION AND DEBRIDEMENT BUTTOCKS Left 05/17/2019   Procedure: IRRIGATION AND DEBRIDEMENT BUTTOCKS;  Surgeon: Donnie Mesa, MD;   Location: [place];  Service: General;  Laterality: Left;   KNEE ARTHROSCOPY Right 07/17/2010   LEFT HEART CATHETERIZATION WITH CORONARY ANGIOGRAM N/A 07/27/2012   Procedure: LEFT HEART CATHETERIZATION WITH CORONARY ANGIOGRAM;  Surgeon: Sherren Mocha, MD;  [facility];  Service: Cardiovascular;  Laterality: N/A;   MASS EXCISION N/A 06/29/2013   Procedure:  WIDE LOCAL EXCISION OF POSTERIOR NECK ABSCESS;  Surgeon: Ralene Ok, MD;  Location: [place];  Service: General;  Laterality: N/A;   REPAIR KNEE LIGAMENT Left    "fixed ligaments and chipped patella"   right transmetatarsal amputation      Social History:   reports that she quit smoking about 16 years ago. Her smoking use included cigarettes. She has a 3.75 pack-year smoking history. She has never used smokeless tobacco. She reports that she does not currently use drugs. She reports that she does not drink alcohol.   Family History:  Her family history includes Allergic rhinitis in her mother; Anxiety disorder in her sister; Asthma in her child; Cancer in her maternal grandmother; Depression in her mother; Diabetes in her maternal grandmother and mother; GER disease in her mother; Heart attack in her father, paternal grandfather, paternal grandmother, and paternal uncle; Heart disease in her paternal grandfather and paternal grandmother; Hyperlipidemia in her maternal grandmother and mother; Hypertension in her maternal grandmother; Migraines in her sister; Other in her maternal grandfather; Restless legs syndrome in her mother.   Allergies: Allergies  Allergen Reactions   Cefepime Other (See Comments)    AKI, see records from [place] hospitalization in January 2020.   Other reaction(s): Other (See Comments) "Shut down organs/kidneys"  Note pt has tolerated Rocephin and Keflex   Iodine Other (See Comments)    Kidney dysfunction   Kiwi Extract Shortness Of Breath, Swelling and Anaphylaxis  Morphine And Related Nausea And Vomiting    Nalbuphine Other (See Comments)    Hallucinations     Trental [Pentoxifylline] Nausea And Vomiting   Cephalosporins Other (See Comments)    Pt states that they cause her kidneys to shut down   Toradol [Ketorolac Tromethamine] Other (See Comments)    Feels like something is crawling on me   Morphine Nausea And Vomiting   Nubain [Nalbuphine Hcl] Other (See Comments)    "FEELS LIKE SOMETHING CRAWLING ON ME"    Home Medications: Prior to Admission medications   Medication Sig Start Date End Date Taking? Authorizing Provider  acetaminophen (TYLENOL) 325 MG tablet Take 1-2 tablets (325-650 mg total) by mouth every 4 (four) hours as needed for mild pain. 06/04/22  Yes Setzer, Edman Circle, PA-C  albuterol (VENTOLIN HFA) 108 (90 Base) MCG/ACT inhaler INHALE 1 TO 2 PUFFS BY MOUTH EVERY SIX HOURS AS NEEDED FOR WHEEZING OR SHORTNESS OF BREATH Patient taking differently: Inhale 1 puff into the lungs every 6 (six) hours as needed for shortness of breath. 07/16/21  Yes [name], MD  amLODipine (NORVASC) 10 MG tablet Take 1 tablet (10 mg total) by mouth daily. 06/04/22  Yes Setzer, Edman Circle, PA-C  aspirin EC 81 MG tablet Take 1 tablet (81 mg total) by mouth daily. Swallow whole. 11/19/20  Yes Sherren Mocha, MD  atropine 1 % ophthalmic solution Place 1 drop into the right eye daily.   Yes [provider]  Brinzolamide-Brimonidine (SIMBRINZA) 1-0.2 % SUSP Place 1 drop into the left eye in the morning, at noon, and at bedtime.   Yes [provider]  cetirizine (ZYRTEC) 10 MG tablet Take 1 tablet (10 mg total) by mouth daily as needed for allergies. 02/11/22  Yes [name], MD  Cholecalciferol 1000 units tablet Take 1 tablet (1,000 Units total) by mouth daily. 04/05/17  Yes [name], MD  DULoxetine (CYMBALTA) 30 MG capsule Take 1 capsule (30 mg total) by mouth daily. X 1 week; then 60 mg daily- for knee and back/nerve pain Patient taking differently: Take 60 mg  by mouth daily. 06/04/22  Yes Setzer, Edman Circle, PA-C  gabapentin (NEURONTIN) 600 MG tablet Take 2 tablets (1,200 mg total) by mouth 2 (two) times daily. 06/04/22  Yes Setzer, Edman Circle, PA-C  insulin aspart (NOVOLOG FLEXPEN) 100 UNIT/ML FlexPen Inject 20 Units into the skin 3 (three) times daily with meals. 05/25/22  Yes Alcus Dad, MD  insulin degludec (TRESIBA FLEXTOUCH) 200 UNIT/ML FlexTouch Pen Inject 52 Units into the skin 2 (two) times daily. 06/04/22  Yes Setzer, Edman Circle, PA-C  latanoprost (XALATAN) 0.005 % ophthalmic solution Place 1 drop into the left eye at bedtime. 06/29/22  Yes [provider]  moxifloxacin (VIGAMOX) 0.5 % ophthalmic solution Place 1 drop into the right eye 4 (four) times daily.   Yes [provider]  neomycin-polymyxin b-dexamethasone (MAXITROL) 3.5-10000-0.1 OINT Place 1 Application into the right eye at bedtime.   Yes [provider]  nystatin (MYCOSTATIN/NYSTOP) powder Apply 1 application. topically 3 (three) times daily. 03/30/22  Yes [name], MD  ondansetron (ZOFRAN) 4 MG tablet Take 1 tablet (4 mg total) by mouth every 8 (eight) hours as needed for nausea or vomiting. 02/17/22  Yes Lovorn, Jinny Blossom, MD  oxyCODONE-acetaminophen (PERCOCET) 7.5-325 MG tablet Take 1 tablet by mouth every 6 (six) hours as needed for severe pain. 04/27/22  Yes [name], MD  prednisoLONE acetate (PRED FORTE) 1 % ophthalmic  suspension Place 1 drop into both eyes 4 (four) times daily. Patient taking differently: Place 1 drop into the right eye 4 (four) times daily. 06/04/22  Yes Setzer, Edman Circle, PA-C  SYMBICORT 160-4.5 MCG/ACT inhaler inhale 2 PUFFS into THE lungs 2 TIMES DAILY Patient taking differently: 2 puffs every 6 (six) hours. Shortness of breath 01/04/22  Yes Hensel, Jamal Collin, MD  Tiotropium Bromide Monohydrate (SPIRIVA RESPIMAT) 1.25 MCG/ACT AERS Inhale 2 puffs into the lungs daily. 11/14/20  Yes Martyn Ehrich, NP  torsemide 40 MG  TABS Take 40 mg by mouth 2 (two) times daily. 06/04/22  Yes Setzer, Edman Circle, PA-C  traZODone (DESYREL) 50 MG tablet Take 0.5-1 tablets (25-50 mg total) by mouth at bedtime as needed for sleep. 06/04/22  Yes Setzer, Edman Circle, PA-C  triamcinolone ointment (KENALOG) 0.5 % Apply 1 Application topically 2 (two) times daily. 06/25/22  Yes Ganta, Anupa, DO  Accu-Chek FastClix Lancets MISC USE DAILY AS INSTRUCTED 05/18/21   [name], MD  ACCU-CHEK GUIDE test strip USE T0 CHECK BLOOD SUGAR 3 TIMES DAILY 11/05/21   [name], MD  Accu-Chek Softclix Lancets lancets Use to check blood glucose four times daily 04/17/20   Martyn Malay, MD  Continuous Blood Gluc Sensor (DEXCOM G6 SENSOR) MISC Inject 1 applicator into the skin as directed. Change sensor every 10 days. 11/05/21   [name], MD  Continuous Blood Gluc Transmit (DEXCOM G6 TRANSMITTER) MISC Inject 1 Device into the skin as directed. Reuse 8 times with sensor changes. 09/03/21   [name], MD  Glucagon (BAQSIMI ONE PACK) 3 MG/DOSE POWD Place 3 mg into the nose as needed (low blood sugar).    [provider]  Glucagon, rDNA, (GLUCAGON EMERGENCY) 1 MG KIT Inject 1 mg into the muscle as needed for low blood sugar. 11/03/21   [provider]  GNP ULTICARE PEN NEEDLES 32G X 4 MM MISC USE TO inject insulin 2 TIMES DAILY 10/02/21   [name], MD    Critical care time: 30 minutes   The patient is critically ill with multiple organ system failure and requires high complexity decision making for assessment and support, frequent evaluation and titration of therapies, advanced monitoring, review of radiographic studies and interpretation of complex data.   Critical Care Time devoted to patient care services, exclusive of separately billable procedures, described in this note is 30 minutes.  Wells Guiles, DO 07/06/2022, 10:41 AM PGY-2, [facility]   Please see [email or web address] for  pager details.  From 7A-7P if no response, please call [phone] After hours, please call ELink [phone]

## 2022-07-06 NOTE — Progress Notes (Signed)
An USGPIV (ultrasound guided PIV) has been placed for short-term vasopressor infusion. A correctly placed ivWatch must be used when administering Vasopressors. Should this treatment be needed beyond 72 hours, central line access should be obtained.  It will be the responsibility of the bedside nurse to follow best practice to prevent extravasations.  HS Rector Devonshire RN 

## 2022-07-06 NOTE — Progress Notes (Signed)
Subjective: No new problems.  Objective: Vital signs in last 24 hours: Temp:  [98.3 F (36.8 C)-100.1 F (37.8 C)] 98.6 F (37 C) (08/01 0825) Pulse Rate:  [78-129] 89 (08/01 0645) Resp:  [8-26] 19 (08/01 0645) BP: (83-178)/(39-118) 114/74 (08/01 0645) SpO2:  [77 %-100 %] 98 % (08/01 0645) FiO2 (%):  [40 %-100 %] 40 % (08/01 0525) Weight:  [106.8 kg-155 kg] 106.8 kg (08/01 0500) Weight change:  Last BM Date :  (PTA)  Intake/Output from previous day: 07/31 0701 - 08/01 0700 In: 2276.7 [I.V.:876.6; IV Piggyback:1400.1] Out: 1450 [Urine:1450] Intake/Output this shift: No intake/output data recorded.  PHYSICAL EXAM: She is awake and alert with oral tracheal tube in place.  She is able to respond to questions with her hands and by nodding. Endotracheal tube in good position.  Neck swelling does not appear to be changed.  There is no fluctuance or induration.  There is slight erythema of the submental and submandibular skin.  Tongue and floor of mouth with normal appearance and mobility.  Unable to visualize the pharynx.  Lab Results: Recent Labs    07/05/22 1944 07/06/22 0518 07/06/22 0654  WBC 16.0*  --  15.2*  HGB 10.4* 11.6* 10.6*  HCT 32.7* 34.0* 34.8*  PLT 350  --  351   BMET Recent Labs    07/05/22 1944 07/06/22 0518 07/06/22 0654  NA 134* 135 135  K 4.7 4.5 4.8  CL 98  --  95*  CO2 26  --  28  GLUCOSE 322*  --  254*  BUN 44*  --  49*  CREATININE 1.53*  --  2.63*  CALCIUM 8.5*  --  8.9    Studies/Results: DG Chest Portable 1 View  Result Date: 07/05/2022 CLINICAL DATA:  Orogastric tube placement. EXAM: PORTABLE CHEST 1 VIEW COMPARISON:  Same day. FINDINGS: Nasogastric tube tip is seen in expected position of proximal stomach. IMPRESSION: Nasogastric tube tip seen in expected position of proximal stomach. Electronically Signed   By: Marijo Conception M.D.   On: 07/05/2022 17:23   DG Chest Port 1 View  Result Date: 07/05/2022 CLINICAL DATA:  Sore throat and  pain with breathing. EXAM: PORTABLE CHEST 1 VIEW COMPARISON:  Chest radiograph 05/27/2022 FINDINGS: Evaluation is significantly degraded by poor image penetration. The endotracheal tube tip is approximately 2.8 cm from the carina. Heart appears enlarged. The mediastinum is grossly within normal limits Lung volumes are low. There is vascular congestion and suspected mild pulmonary interstitial edema. There is suspected retrocardiac opacity there is no focal opacity on the right. There is a possible left pleural effusion. There is no significant right effusion. There is no definite pneumothorax. There is no definite acute osseous abnormality. Lucency projecting over the left hemiabdomen and lower left hemithorax is favored to be artifactual. IMPRESSION: 1. Image quality is degraded by patient positioning and poor image penetration. Within this confine: 2. Endotracheal tube tip approximately 2.8 cm from the carina. 3. Cardiomegaly with vascular congestion and suspected mild pulmonary interstitial edema. Possible left pleural effusion and retrocardiac opacity. Consider repeat imaging as indicated. Electronically Signed   By: Valetta Mole M.D.   On: 07/05/2022 16:46   CT Soft Tissue Neck W Contrast  Result Date: 07/05/2022 CLINICAL DATA:  Soft tissue swelling, infection suspected, neck x-ray done. EXAM: CT NECK WITH CONTRAST TECHNIQUE: Multidetector CT imaging of the neck was performed using the standard protocol following the bolus administration of intravenous contrast. RADIATION DOSE REDUCTION: This exam was performed  according to the departmental dose-optimization program which includes automated exposure control, adjustment of the mA and/or kV according to patient size and/or use of iterative reconstruction technique. CONTRAST:  38m OMNIPAQUE IOHEXOL 300 MG/ML  SOLN COMPARISON:  None Available. FINDINGS: Mildly motion degraded exam. Pharynx and larynx: Streak and beam hardening artifact arising from dental  restoration partially obscures the oral cavity. Mucosal/submucosal edema within the left oropharynx without significant airway narrowing. No definite epiglottic/laryngeal swelling. No retropharyngeal collection. Salivary glands: Foci of gas within the left submandibular gland. Edema/inflammatory stranding surrounding the left submandibular gland and extending into the ventral upper neck. Unremarkable appearance of the parotid and right submandibular glands. Thyroid: Unremarkable. Lymph nodes: Enlarged left level 2 and 3 lymph nodes, measuring up to 19 mm in short axis (for instance as seen on series 5, image 39). Vascular: The major vascular structures of the neck are patent. Atherosclerotic plaque about the carotid bifurcations. Limited intracranial: No evidence of acute intracranial abnormality within the field of view. Visualized orbits: Excluded from the field of view. Mastoids and visualized paranasal sinuses: No significant paranasal sinus disease or mastoid effusion at the imaged levels. Skeleton: Straightening of the expected cervical lordosis. No acute fracture or aggressive osseous lesion. Upper chest: No consolidation within the imaged lung apices. IMPRESSION: Edema and inflammatory stranding surrounding the left submandibular gland consistent with acute left submandibular sialoadenitis. Edema and inflammatory stranding also extend into the ventral upper neck. There are foci of gas within the left submandibular gland, which may be secondary to infection with a gas-producing organism, or potentially related to recent positive pressure ventilation. Mucosal/submucosal edema within the left oropharynx, suggesting pharyngitis. Left-sided cervical lymphadenopathy, as described and likely reactive. Electronically Signed   By: KKellie SimmeringD.O.   On: 07/05/2022 10:39    Medications: I have reviewed the patient's current medications.  Assessment/Plan: Pharyngeal infection following traumatic intubation.   Continue antibiotics.  Extubate when clinically appropriate.  LOS: 1 day    Medical Decision Making: #/Complex Problems: 3  Data Reviewed:2  Management:2 (1-Straightforward, 2-Low, 3-Moderate, 4-High)   JIzora Gala8/12/2021, 8:37 AM

## 2022-07-06 NOTE — Inpatient Diabetes Management (Signed)
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 Norma Fredrickson with [facility] Last seen 05/11/2022 Was told to take the following: Increase Tresiba to 40 units BID Take Novolog 40 units TID with meals Continue Novolog SSI (6 units for every 50>200) Start Ozempic 1 mg Qweek and Stop the Trulicity    Note Semglee 27 units BID to start this AM  Agree with addition of Semglee (50% home dose)  Will follow   --Will follow patient during hospitalization--  Wyn Quaker RN, MSN, [place] Diabetes Coordinator Inpatient Glycemic Control Team Team Pager: [phone] (8a-5p)

## 2022-07-07 DIAGNOSIS — J391 Other abscess of pharynx: Secondary | ICD-10-CM | POA: Diagnosis not present

## 2022-07-07 DIAGNOSIS — K122 Cellulitis and abscess of mouth: Secondary | ICD-10-CM | POA: Diagnosis not present

## 2022-07-07 LAB — CBC WITH DIFFERENTIAL/PLATELET
Abs Immature Granulocytes: 0.11 10*3/uL — ABNORMAL HIGH (ref 0.00–0.07)
Basophils Absolute: 0.1 10*3/uL (ref 0.0–0.1)
Basophils Relative: 0 %
Eosinophils Absolute: 0.1 10*3/uL (ref 0.0–0.5)
Eosinophils Relative: 1 %
HCT: 32.9 % — ABNORMAL LOW (ref 36.0–46.0)
Hemoglobin: 10.3 g/dL — ABNORMAL LOW (ref 12.0–15.0)
Immature Granulocytes: 1 %
Lymphocytes Relative: 18 %
Lymphs Abs: 2.7 10*3/uL (ref 0.7–4.0)
MCH: 29.7 pg (ref 26.0–34.0)
MCHC: 31.3 g/dL (ref 30.0–36.0)
MCV: 94.8 fL (ref 80.0–100.0)
Monocytes Absolute: 1.7 10*3/uL — ABNORMAL HIGH (ref 0.1–1.0)
Monocytes Relative: 12 %
Neutro Abs: 10.4 10*3/uL — ABNORMAL HIGH (ref 1.7–7.7)
Neutrophils Relative %: 68 %
Platelets: 363 10*3/uL (ref 150–400)
RBC: 3.47 MIL/uL — ABNORMAL LOW (ref 3.87–5.11)
RDW: 14.6 % (ref 11.5–15.5)
WBC: 15.1 10*3/uL — ABNORMAL HIGH (ref 4.0–10.5)
nRBC: 0 % (ref 0.0–0.2)

## 2022-07-07 LAB — GLUCOSE, CAPILLARY
Glucose-Capillary: 201 mg/dL — ABNORMAL HIGH (ref 70–99)
Glucose-Capillary: 201 mg/dL — ABNORMAL HIGH (ref 70–99)
Glucose-Capillary: 192 mg/dL — ABNORMAL HIGH (ref 70–99)
Glucose-Capillary: 208 mg/dL — ABNORMAL HIGH (ref 70–99)
Glucose-Capillary: 216 mg/dL — ABNORMAL HIGH (ref 70–99)
Glucose-Capillary: 231 mg/dL — ABNORMAL HIGH (ref 70–99)

## 2022-07-07 LAB — BASIC METABOLIC PANEL
Anion gap: 14 (ref 5–15)
BUN: 54 mg/dL — ABNORMAL HIGH (ref 6–20)
CO2: 23 mmol/L (ref 22–32)
Calcium: 8.5 mg/dL — ABNORMAL LOW (ref 8.9–10.3)
Chloride: 98 mmol/L (ref 98–111)
Creatinine, Ser: 3.93 mg/dL — ABNORMAL HIGH (ref 0.44–1.00)
GFR, Estimated: 14 mL/min — ABNORMAL LOW (ref >60)
Glucose, Bld: 220 mg/dL — ABNORMAL HIGH (ref 70–99)
Potassium: 4.9 mmol/L (ref 3.5–5.1)
Sodium: 135 mmol/L (ref 135–145)

## 2022-07-07 MED ORDER — PROSOURCE TF PO LIQD
45.0000 mL | Freq: Three times a day (TID) | ORAL | Status: DC
  Administered 2022-07-07 – 2022-07-08 (×3): 45 mL
  Filled 2022-07-07 (×3): qty 45

## 2022-07-07 MED ORDER — ADULT MULTIVITAMIN W/MINERALS CH
1.0000 | ORAL_TABLET | Freq: Every day | ORAL | Status: DC
  Administered 2022-07-08 – 2022-07-12 (×4): 1
  Filled 2022-07-07 (×4): qty 1

## 2022-07-07 MED ORDER — ACETAMINOPHEN 160 MG/5ML PO SOLN
650.0000 mg | ORAL | Status: AC | PRN
  Administered 2022-07-07 – 2022-07-09 (×6): 650 mg
  Filled 2022-07-07 (×6): qty 20.3

## 2022-07-07 MED ORDER — VITAL 1.5 CAL PO LIQD
1000.0000 mL | ORAL | Status: DC
  Administered 2022-07-07: 1000 mL

## 2022-07-07 MED ORDER — PANTOPRAZOLE 2 MG/ML SUSPENSION
40.0000 mg | Freq: Every day | ORAL | Status: DC
Start: 2022-07-07 — End: 2022-07-10
  Administered 2022-07-07 – 2022-07-09 (×3): 40 mg
  Filled 2022-07-07 (×3): qty 20

## 2022-07-07 MED ORDER — SODIUM CHLORIDE 0.9 % IV SOLN
3.0000 g | Freq: Two times a day (BID) | INTRAVENOUS | Status: DC
  Administered 2022-07-07 – 2022-07-11 (×8): 3 g via INTRAVENOUS
  Filled 2022-07-07 (×8): qty 8

## 2022-07-07 NOTE — Progress Notes (Signed)
NAME:  [name], MRN:  [id number], DOB:  24-Jan-1976, LOS: 2 ADMISSION DATE:  07/05/2022 CONSULTATION DATE:  07/05/2022 REFERRING MD:  Jeani Hawking - FMTS CHIEF COMPLAINT:  Ludwig's angina, compromised airway   History of Present Illness:  46 year old woman who presented to [facility] ED 7/31 as a transfer from [facility] ED for further evaluation of progressively worsening throat pain and dysphagia. 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 PMHx significant for HTN, HLD, acute-on-chronic CHF, CAD, COPD/asthma, OSA on CPAP, T2DM (c/b diabetic peripheral neuropathy and bilateral BKAs), GERD, bipolar disorder.  Patient initially contacted family medicine after hours phone line regarding neck soreness and "mucus" in the back of her throat causing difficulty breathing.  Also reported dysphagia with inability to take PO.  At the time of the phone call denied any blood or associated cough with throat discomfort.  Patient presented to [facility] ED a few hours later via EMS with complaints of progressively worsening throat discomfort and dysphagia since eye surgery at [facility] 7/28.  At the time of presentation, patient was noted to have increasing fullness of the area below the angle of the mandible with associated redness and severe pain with concern for deep space infection of the neck, possibly Ludwig's angina vs. tracheal perforation.  Patient then developed trismus with concern for airway compromise.  ENT was called for evaluation.  Empiric cefepime/vanc were initiated.  [facility] to [facility] ED for further care and fiberoptic nasopharyngoscopy with ENT.  CT Neck 7/31 demonstrated edema and inflammatory stranding around the left submandibular gland, consistent with acute left submandibular sialoadenitis.  Edema and inflammatory stranding were also noted to extend into the ventral upper neck with foci of gas within the left submandibular gland, possibly secondary to infection with gas-producing organism and  associated pharyngitis.  While awaiting floor bed, patient decompensated from an airway protection standpoint, became stridorous and PCCM was consulted for admission.  On arrival to evaluate patient, anesthesia was preparing to intubate. Intubation was successful on second attempt.  Dr. Constance Holster with ENT was present at bedside during intubation, noting excessive purulent secretions but no significant swelling of the supraglottic larynx recommendation for conservative management with IV antibiotics.  PCCM admitted patient for further care.  Pertinent Medical History:   Past Medical History:  Diagnosis Date   Abscess of groin, left    Acute on chronic diastolic (congestive) heart failure ([place]) 02/05/2022   Acute on chronic respiratory failure with hypoxia ([name]) 04/11/2022   Acute osteomyelitis of ankle or foot, left ([place]) 01/31/2018   Acute respiratory failure with hypoxia and hypercarbia ([place]) 06/15/2020   Alveolar hypoventilation    Amputation stump infection ([place]) 04/09/2018   Anemia    not on iron pill   Asthma    Bipolar 2 disorder ([name])    Candidiasis of vagina 05/15/2022   Carpal tunnel syndrome on right    recurrent   Cellulitis 08/2010-08/2011   Chest pain with low risk for cardiac etiology 05/11/2021   Chronic pain    COPD (chronic obstructive pulmonary disease) ([name])    Symbicort daily and Proventil as needed   Costochondritis    De Quervain's tenosynovitis, bilateral 11/01/2015   Depression    Diabetes mellitus type II, uncontrolled 2000   Type 2, Uncontrolled.Takes Lantus daily.Fasting blood sugar runs 150   Diabetic ulcer of right foot ([place]) 12/19/2021   Drug-seeking behavior    Elevated troponin    Exposure to mold 04/21/2020   GERD (gastroesophageal reflux disease)  takes Pantoprazole and Zantac daily   HLD (hyperlipidemia)    takes Atorvastatin daily   Hypertension    takes Lisinopril and Coreg daily   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[place]) 04/11/2019   Left shoulder pain 06/23/2019    Morbid obesity ([place])    Morbid obesity with BMI of 60.0-69.9, adult ([place]) 04/15/2021   Nocturia    Nonobstructive atherosclerosis of coronary artery 11/26/2020   11/26/20 Coronary angiogram: 1.  Mild diffuse proximal LAD stenosis with no evidence of obstructive disease (20 to 30% diffuse stenosis);  2.  Left dominant circumflex with widely patent obtuse marginal branches and left PDA branch, no significant stenoses present  3.  Nondominant RCA with mild diffuse nonobstructive stenosis    OSA on CPAP    Peripheral neuropathy    takes Gabapentin daily   Pneumonia    "walking" several yrs ago and as a baby (12/05/2018)   Pneumonia due to COVID-19 virus 04/14/2021   Primary osteoarthritis of first carpometacarpal joint of left hand 07/30/2016   Rectal fissure    Restless leg    Right carpal tunnel syndrome 09/01/2011   SVT (supraventricular tachycardia) ([place])    Syncope 02/25/2016   Thrombocytosis 04/11/2022   Urinary frequency    Urinary incontinence 10/23/2020   UTI (urinary tract infection) 04/15/2021   Varicose veins    Right medial thigh and Left leg    Significant Hospital Events: Including procedures, antibiotic start and stop dates in addition to other pertinent events   7/28 - Underwent eye surgery at [facility] 7/31 Presented to [facility] ED via EMS for progressively worsening throat pain and associated dysphagia, ENT consulted and recommended transfer to [facility] ED for nasopharyngeal scope.While awaiting bed became stridorous with concern for airway compromise and anesthesia called to intubate. Intubated in ED. PCCM consulted for admission and escalation of care. 8/1 - transitioned abx to Unasyn, MAP 50s > transitioned to Precedex and Fentanyl with Levophed if needed, development of AKI 8/2 - febrile to 102.1, start tube feeds  Interim History / Subjective:  As above  Objective:  Blood pressure (!) 110/23, pulse (!) 118, temperature (!) 102.7 F (39.3 C), temperature source  Axillary, resp. rate 13, height 5' (1.524 m), weight (!) 154.2 kg, last menstrual period 07/05/2022, SpO2 95 %.    Vent Mode: CPAP;PSV FiO2 (%):  [30 %] 30 % Set Rate:  [24 bmp] 24 bmp PEEP:  [8 cmH20] 8 cmH20 Pressure Support:  [5 cmH20] 5 cmH20 Plateau Pressure:  [20 cmH20-22 cmH20] 20 cmH20   Intake/Output Summary (Last 24 hours) at 07/07/2022 1100 Last data filed at 07/07/2022 0900 Gross per 24 hour  Intake 3045.53 ml  Output 890 ml  Net 2155.53 ml   Filed Weights   07/05/22 1614 07/06/22 0500 07/07/22 0416  Weight: (!) 155 kg 106.8 kg (!) 154.2 kg   Physical Examination: General: arousable to voice, follows commands HEENT: neck with submandibular swelling and erythema (generalized, L > R), tenderness to palpation on the left Neuro: Responds to verbal stimuli. Following commands intermittently. Moves all 4 extremities spontaneously.   CV: RRR, no m/g/r. PULM: Breathing even and unlabored on vent post-extubation (PEEP 8, FiO2 30%). Lung fields CTAB. GI: Obese, soft, nontender. Normoactive bowel sounds. Extremities: Bilateral BKAs. 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 CT Neck with edema/inflammatory stranding around the left submandibular gland, consistent  with acute left submandibular sialoadenitis, extending into the ventral upper neck with foci of gas within the left submandibular gland, possibly secondary to infection with gas-producing organism. ENT consulted for evaluation, recommended transfer to [facility] for fiberoptic nasopharyngeal evaluation. Intubated in ED 7/31. Abx transitioned to Unasyn on 8/1. Febrile overnight to 102.1.  - ENT consulted, appreciate recommendations - Continue Unasyn 3g IV q6h - Keep intubated for airway protection; discuss extubation versus tracheostomy pending progress -daily CBC w/ diff  Acute  hypoxemic respiratory failure in the setting of deep soft tissue throat infection COPD OSA on CPAP - Continue full vent support (4-8cc/kg IBW) - Wean FiO2 for O2 sat > 90% - Daily WUA/SBT - VAP bundle - Bronchodilators (albuterol, DuoNeb, Brovana) - Pulmonary hygiene - PAD protocol for sedation adjusted due to low MAP: Precedex and Fentanyl for goal RASS 0 to -1 - Extubation will be limited by ongoing neck soft tissue swelling, risk of airway compromise; discuss with ENT re: extubation trial vs. Trach, consider repeat CT neck  Acute on chronic diastolic heart failure CAD HTN HLD Echo 02/2021 with EF 55-60%, mild LVH. Hypotensive with MAP 50.  - Monitor strict I&Os - Continue ASA - Hold home amlodipine for now, resume as appropriate - Cardiac monitoring - Levophed infusion as needed with goal MAP >65, SBP >90  AKI on CKD3a Cr increased from 2.63>3.93 with baseline . Likely multifactorial in the setting of Cefepime use (hx of AKI in past hospitalizations with use), low pressures, malnutrition. Foley catheter placed yesterday with 0.1 mL/kg/hr output.  - daily BMP - Hold home torsemide  Type 2 diabetes mellitus Bilateral lower extremity amputation Poorly controlled in the 200s. Home regimen includes degludec 52u BID, Novolog 20u TID. - Semglee 50u BID - Consider TF coverage - moderate SSI, goal CBG 140-180 - CBGs Q4H  Diabetic peripheral neuropathy - Hold home gabapentin, Cymbalta for now  At risk for malnutrition - Tube feeds initiated  Bipolar 2 disorder - No meds noted to home med list  Best Practice: (right click and "Reselect all SmartList Selections" daily)   Diet/type: tubefeeds DVT prophylaxis: prophylactic heparin  GI prophylaxis: PPI Lines: N/A Foley:  Yes, and it is still needed Code Status:  full code Last date of multidisciplinary goals of care discussion [Pending]  Labs:  CBC: Recent Labs  Lab 07/05/22 0857 07/05/22 0905 07/05/22 1659  07/05/22 1944 07/06/22 0518 07/06/22 0654 07/07/22 0129  WBC 15.2*  --   --  16.0*  --  15.2* 15.1*  NEUTROABS  --   --   --  11.8*  --   --  10.4*  HGB 11.6*   < > 11.9* 10.4* 11.6* 10.6* 10.3*  HCT 36.3   < > 35.0* 32.7* 34.0* 34.8* 32.9*  MCV 93.1  --   --  92.4  --  96.4 94.8  PLT 358  --   --  350  --  351 363   < > = values in this interval not displayed.   Basic Metabolic Panel: Recent Labs  Lab 07/05/22 0857 07/05/22 0905 07/05/22 1659 07/05/22 1944 07/06/22 0518 07/06/22 0654 07/07/22 0129  NA 134* 134* 135 134* 135 135 135  K 4.9 4.4 4.7 4.7 4.5 4.8 4.9  CL 95* 96*  --  98  --  95* 98  CO2 27  --   --  26  --  28 23  GLUCOSE 312* 319*  --  322*  --  254* 220*  BUN 47* 45*  --  44*  --  49* 54*  CREATININE 1.15* 1.30*  --  1.53*  --  2.63* 3.93*  CALCIUM 8.7*  --   --  8.5*  --  8.9 8.5*  MG  --   --   --  2.2  --  2.4  --   PHOS  --   --   --  4.5  --  7.0*  --    GFR: Estimated Creatinine Clearance: 25.7 mL/min (A) (by C-G formula based on SCr of 3.93 mg/dL (H)). Recent Labs  Lab 07/05/22 0857 07/05/22 0900 07/05/22 1944 07/06/22 0654 07/07/22 0129  WBC 15.2*  --  16.0* 15.2* 15.1*  LATICACIDVEN  --  1.2 1.0  --   --    Liver Function Tests: Recent Labs  Lab 07/05/22 1944  AST 8*  ALT 13  ALKPHOS 113  BILITOT 1.1  PROT 7.9  ALBUMIN 2.8*   No results for input(s): "LIPASE", "AMYLASE" in the last 168 hours. No results for input(s): "AMMONIA" in the last 168 hours.  ABG:    Component Value Date/Time   PHART 7.272 (L) 07/06/2022 0518   PCO2ART 66.6 (HH) 07/06/2022 0518   PO2ART 410 (H) 07/06/2022 0518   HCO3 30.7 (H) 07/06/2022 0518   TCO2 33 (H) 07/06/2022 0518   ACIDBASEDEF 5.6 (H) 10/21/2021 1126   O2SAT 100 07/06/2022 0518   Coagulation Profile: No results for input(s): "INR", "PROTIME" in the last 168 hours.  Cardiac Enzymes: No results for input(s): "CKTOTAL", "CKMB", "CKMBINDEX", "TROPONINI" in the last 168  hours.  HbA1C: HbA1c POC (<> result, manual entry)  Date/Time Value Ref Range Status  09/29/2021 10:46 AM >15.0 4.0 - 5.6 % Final  04/03/2020 10:25 AM >15.0 4.0 - 5.6 % Final   Hgb A1c MFr Bld  Date/Time Value Ref Range Status  05/16/2022 01:43 AM 14.9 (H) 4.8 - 5.6 % Final    Comment:    (NOTE) Pre diabetes:          5.7%-6.4%  Diabetes:              >6.4%  Glycemic control for   <7.0% adults with diabetes   12/19/2021 08:00 AM >15.5 (H) 4.8 - 5.6 % Final    Comment:    (NOTE) **Verified by repeat analysis**         Prediabetes: 5.7 - 6.4         Diabetes: >6.4         Glycemic control for adults with diabetes: <7.0    CBG: Recent Labs  Lab 07/06/22 1526 07/06/22 1945 07/06/22 2317 07/07/22 0324 07/07/22 0820  GLUCAP 310* 298* 241* 201* 201*   Review of Systems:   Patient is encephalopathic and/or intubated. Therefore history has been obtained from chart review.   Past Medical History:  She,  has a past medical history of Abscess of groin, left, Acute on chronic diastolic (congestive) heart failure ([place]) (02/05/2022), Acute on chronic respiratory failure with hypoxia ([place]) (04/11/2022), Acute osteomyelitis of ankle or foot, left ([place]) (01/31/2018), Acute respiratory failure with hypoxia and hypercarbia ([place]) (06/15/2020), Alveolar hypoventilation, Amputation stump infection ([place]) (04/09/2018), Anemia, Asthma, Bipolar 2 disorder ([place]), Candidiasis of vagina (05/15/2022), Carpal tunnel syndrome on right, Cellulitis (08/2010-08/2011), Chest pain with low risk for cardiac etiology (05/11/2021), Chronic pain, COPD (chronic obstructive pulmonary disease) ([place]), Costochondritis, De Quervain's tenosynovitis, bilateral (11/01/2015), Depression, Diabetes mellitus type II, uncontrolled (2000), Diabetic ulcer of right foot ([place]) (12/19/2021), Drug-seeking behavior, Elevated troponin, Exposure to mold (04/21/2020), GERD (  gastroesophageal reflux disease), HLD (hyperlipidemia), Hypertension, Left  below-knee amputee ([place]) (04/11/2019), Left shoulder pain (06/23/2019), Morbid obesity ([place]), Morbid obesity with BMI of 60.0-69.9, adult ([place]) (04/15/2021), Nocturia, Nonobstructive atherosclerosis of coronary artery (11/26/2020), OSA on CPAP, Peripheral neuropathy, Pneumonia, Pneumonia due to COVID-19 virus (04/14/2021), Primary osteoarthritis of first carpometacarpal joint of left hand (07/30/2016), Rectal fissure, Restless leg, Right carpal tunnel syndrome (09/01/2011), SVT (supraventricular tachycardia) ([place]), Syncope (02/25/2016), Thrombocytosis (04/11/2022), Urinary frequency, Urinary incontinence (10/23/2020), UTI (urinary tract infection) (04/15/2021), and Varicose veins.   Surgical History:   Past Surgical History:  Procedure Laterality Date   AMPUTATION Left 02/01/2018   Procedure: LEFT FOURTH AND 5TH TOE RAY AMPUTATION;  Surgeon: [name], MD;  Location: [place];  Service: Orthopedics;  Laterality: Left;   AMPUTATION Left 03/03/2018   Procedure: LEFT BELOW KNEE AMPUTATION;  Surgeon: [name], MD;  Location: [place];  Service: Orthopedics;  Laterality: Left;   AMPUTATION Right 05/21/2022   Procedure: REVISION AMPUTATION RAY 1ST;  Surgeon: [name], DPM;  Location: [place];  Service: Podiatry;  Laterality: Right;   CARPAL TUNNEL RELEASE Bilateral    CESAREAN SECTION  2007   CORONARY ANGIOGRAPHY N/A 11/26/2020   Procedure: CORONARY ANGIOGRAPHY;  Surgeon: Sherren Mocha, MD;  Location: [place] CV LAB;  Service: Cardiovascular;  Laterality: N/A;   FOOT AMPUTATION Right    INCISION AND DRAINAGE ABSCESS Left 10/16/2021   Procedure: INCISION AND DRAINAGE ABSCESS;  Surgeon: Dwan Bolt, MD;  Location: [place];  Service: General;  Laterality: Left;   INCISION AND DRAINAGE PERIRECTAL ABSCESS Left 05/18/2019   Procedure: IRRIGATION AND DEBRIDEMENT OF PANNIS ABSCESS, POSSIBLE DEBRIDEMENT OF BUTTOCK WOUND;  Surgeon: Donnie Mesa, MD;  Location: [place];  Service: General;  Laterality: Left;    IRRIGATION AND DEBRIDEMENT BUTTOCKS Left 05/17/2019   Procedure: IRRIGATION AND DEBRIDEMENT BUTTOCKS;  Surgeon: Donnie Mesa, MD;  Location: [place];  Service: General;  Laterality: Left;   KNEE ARTHROSCOPY Right 07/17/2010   LEFT HEART CATHETERIZATION WITH CORONARY ANGIOGRAM N/A 07/27/2012   Procedure: LEFT HEART CATHETERIZATION WITH CORONARY ANGIOGRAM;  Surgeon: Sherren Mocha, MD;  Location: [facility] CATH LAB;  Service: Cardiovascular;  Laterality: N/A;   MASS EXCISION N/A 06/29/2013   Procedure:  WIDE LOCAL EXCISION OF POSTERIOR NECK ABSCESS;  Surgeon: Ralene Ok, MD;  Location: [place];  Service: General;  Laterality: N/A;   REPAIR KNEE LIGAMENT Left    "fixed ligaments and chipped patella"   right transmetatarsal amputation      Social History:   reports that she quit smoking about 16 years ago. Her smoking use included cigarettes. She has a 3.75 pack-year smoking history. She has never used smokeless tobacco. She reports that she does not currently use drugs. She reports that she does not drink alcohol.   Family History:  Her family history includes Allergic rhinitis in her mother; Anxiety disorder in her sister; Asthma in her child; Cancer in her maternal grandmother; Depression in her mother; Diabetes in her maternal grandmother and mother; GER disease in her mother; Heart attack in her father, paternal grandfather, paternal grandmother, and paternal uncle; Heart disease in her paternal grandfather and paternal grandmother; Hyperlipidemia in her maternal grandmother and mother; Hypertension in her maternal grandmother; Migraines in her sister; Other in her maternal grandfather; Restless legs syndrome in her mother.   Allergies: Allergies  Allergen Reactions   Cefepime Other (See Comments)    AKI, see records from [place] hospitalization in January 2020.   Other reaction(s):  Other (See Comments) "Shut down organs/kidneys"  Note pt has tolerated Rocephin and Keflex   Iodine Other (See  Comments)    Kidney dysfunction   Kiwi Extract Shortness Of Breath, Swelling and Anaphylaxis   Morphine And Related Nausea And Vomiting   Nalbuphine Other (See Comments)    Hallucinations     Trental [Pentoxifylline] Nausea And Vomiting   Cephalosporins Other (See Comments)    Pt states that they cause her kidneys to shut down   Toradol [Ketorolac Tromethamine] Other (See Comments)    Feels like something is crawling on me   Morphine Nausea And Vomiting   Nubain [Nalbuphine Hcl] Other (See Comments)    "FEELS LIKE SOMETHING CRAWLING ON ME"    Home Medications: Prior to Admission medications   Medication Sig Start Date End Date Taking? Authorizing Provider  acetaminophen (TYLENOL) 325 MG tablet Take 1-2 tablets (325-650 mg total) by mouth every 4 (four) hours as needed for mild pain. 06/04/22  Yes Setzer, Edman Circle, PA-C  albuterol (VENTOLIN HFA) 108 (90 Base) MCG/ACT inhaler INHALE 1 TO 2 PUFFS BY MOUTH EVERY SIX HOURS AS NEEDED FOR WHEEZING OR SHORTNESS OF BREATH Patient taking differently: Inhale 1 puff into the lungs every 6 (six) hours as needed for shortness of breath. 07/16/21  Yes [name], MD  amLODipine (NORVASC) 10 MG tablet Take 1 tablet (10 mg total) by mouth daily. 06/04/22  Yes Setzer, Edman Circle, PA-C  aspirin EC 81 MG tablet Take 1 tablet (81 mg total) by mouth daily. Swallow whole. 11/19/20  Yes Sherren Mocha, MD  atropine 1 % ophthalmic solution Place 1 drop into the right eye daily.   Yes [provider]  Brinzolamide-Brimonidine (SIMBRINZA) 1-0.2 % SUSP Place 1 drop into the left eye in the morning, at noon, and at bedtime.   Yes [provider]  cetirizine (ZYRTEC) 10 MG tablet Take 1 tablet (10 mg total) by mouth daily as needed for allergies. 02/11/22  Yes [name], MD  Cholecalciferol 1000 units tablet Take 1 tablet (1,000 Units total) by mouth daily. 04/05/17  Yes [name], MD  DULoxetine (CYMBALTA) 30 MG capsule Take  1 capsule (30 mg total) by mouth daily. X 1 week; then 60 mg daily- for knee and back/nerve pain Patient taking differently: Take 60 mg by mouth daily. 06/04/22  Yes Setzer, Edman Circle, PA-C  gabapentin (NEURONTIN) 600 MG tablet Take 2 tablets (1,200 mg total) by mouth 2 (two) times daily. 06/04/22  Yes Setzer, Edman Circle, PA-C  insulin aspart (NOVOLOG FLEXPEN) 100 UNIT/ML FlexPen Inject 20 Units into the skin 3 (three) times daily with meals. 05/25/22  Yes Alcus Dad, MD  insulin degludec (TRESIBA FLEXTOUCH) 200 UNIT/ML FlexTouch Pen Inject 52 Units into the skin 2 (two) times daily. 06/04/22  Yes Setzer, Edman Circle, PA-C  latanoprost (XALATAN) 0.005 % ophthalmic solution Place 1 drop into the left eye at bedtime. 06/29/22  Yes [provider]  moxifloxacin (VIGAMOX) 0.5 % ophthalmic solution Place 1 drop into the right eye 4 (four) times daily.   Yes [provider]  neomycin-polymyxin b-dexamethasone (MAXITROL) 3.5-10000-0.1 OINT Place 1 Application into the right eye at bedtime.   Yes [provider]  nystatin (MYCOSTATIN/NYSTOP) powder Apply 1 application. topically 3 (three) times daily. 03/30/22  Yes [name], MD  ondansetron (ZOFRAN) 4 MG tablet Take 1 tablet (4 mg total) by mouth every 8 (eight) hours as needed for nausea or vomiting. 02/17/22  Yes Lovorn,  Jinny Blossom, MD  oxyCODONE-acetaminophen (PERCOCET) 7.5-325 MG tablet Take 1 tablet by mouth every 6 (six) hours as needed for severe pain. 04/27/22  Yes [name], MD  prednisoLONE acetate (PRED FORTE) 1 % ophthalmic suspension Place 1 drop into both eyes 4 (four) times daily. Patient taking differently: Place 1 drop into the right eye 4 (four) times daily. 06/04/22  Yes Setzer, Edman Circle, PA-C  SYMBICORT 160-4.5 MCG/ACT inhaler inhale 2 PUFFS into THE lungs 2 TIMES DAILY Patient taking differently: 2 puffs every 6 (six) hours. Shortness of breath 01/04/22  Yes Hensel, Jamal Collin, MD  Tiotropium Bromide  Monohydrate (SPIRIVA RESPIMAT) 1.25 MCG/ACT AERS Inhale 2 puffs into the lungs daily. 11/14/20  Yes Martyn Ehrich, NP  torsemide 40 MG TABS Take 40 mg by mouth 2 (two) times daily. 06/04/22  Yes Setzer, Edman Circle, PA-C  traZODone (DESYREL) 50 MG tablet Take 0.5-1 tablets (25-50 mg total) by mouth at bedtime as needed for sleep. 06/04/22  Yes Setzer, Edman Circle, PA-C  triamcinolone ointment (KENALOG) 0.5 % Apply 1 Application topically 2 (two) times daily. 06/25/22  Yes Ganta, Anupa, DO  Accu-Chek FastClix Lancets MISC USE DAILY AS INSTRUCTED 05/18/21   [name], MD  ACCU-CHEK GUIDE test strip USE T0 CHECK BLOOD SUGAR 3 TIMES DAILY 11/05/21   [name], MD  Accu-Chek Softclix Lancets lancets Use to check blood glucose four times daily 04/17/20   Martyn Malay, MD  Continuous Blood Gluc Sensor (DEXCOM G6 SENSOR) MISC Inject 1 applicator into the skin as directed. Change sensor every 10 days. 11/05/21   [name], MD  Continuous Blood Gluc Transmit (DEXCOM G6 TRANSMITTER) MISC Inject 1 Device into the skin as directed. Reuse 8 times with sensor changes. 09/03/21   [name], MD  Glucagon (BAQSIMI ONE PACK) 3 MG/DOSE POWD Place 3 mg into the nose as needed (low blood sugar).    [provider]  Glucagon, rDNA, (GLUCAGON EMERGENCY) 1 MG KIT Inject 1 mg into the muscle as needed for low blood sugar. 11/03/21   [provider]  GNP ULTICARE PEN NEEDLES 32G X 4 MM MISC USE TO inject insulin 2 TIMES DAILY 10/02/21   [name], MD    Critical care time: 30 minutes   The patient is critically ill with multiple organ system failure and requires high complexity decision making for assessment and support, frequent evaluation and titration of therapies, advanced monitoring, review of radiographic studies and interpretation of complex data.   Critical Care Time devoted to patient care services, exclusive of separately billable procedures,  described in this note is 30 minutes.  Wells Guiles, DO 07/07/2022, 11:00 AM PGY-2, [facility]   Please see [email or web address] for pager details.  From 7A-7P if no response, please call [phone] After hours, please call ELink [phone]

## 2022-07-07 NOTE — Progress Notes (Signed)
[place] Progress Note Patient Name: [name] DOB: 06-07-1976 MRN: [id number]   Date of Service  07/07/2022  HPI/Events of Note  Patient with a temperature of 102.1, LFT's within normal limits.  eICU Interventions  PRN enteral Tylenol ordered.        [name] 07/07/2022, 4:25 AM

## 2022-07-07 NOTE — Progress Notes (Signed)
Subjective: Still intubated and sedated but arousable.  No new complaints offered.  Objective: Vital signs in last 24 hours: Temp:  [98.9 F (37.2 C)-102.7 F (39.3 C)] 102.7 F (39.3 C) (08/02 0741) Pulse Rate:  [76-118] 118 (08/02 0900) Resp:  [11-27] 13 (08/02 0900) BP: (84-133)/(23-118) 110/23 (08/02 0900) SpO2:  [70 %-99 %] 95 % (08/02 0900) FiO2 (%):  [30 %] 30 % (08/02 0741) Weight:  [154.2 kg] 154.2 kg (08/02 0416) Weight change: -0.8 kg Last BM Date :  (PTA)  Intake/Output from previous day: 08/01 0701 - 08/02 0700 In: 3140.4 [I.V.:2550.2; NG/GT:180; IV Piggyback:400.2] Out: 890 [Urine:290; Emesis/NG output:600] Intake/Output this shift: No intake/output data recorded.  PHYSICAL EXAM: Orally intubated.  Neck without induration or fluctuance.  Tongue and floor mouth are soft and free of any edema or signs of infection.  Unable to evaluate the pharynx due to the endotracheal tube.  Lab Results: Recent Labs    07/06/22 0654 07/07/22 0129  WBC 15.2* 15.1*  HGB 10.6* 10.3*  HCT 34.8* 32.9*  PLT 351 363   BMET Recent Labs    07/06/22 0654 07/07/22 0129  NA 135 135  K 4.8 4.9  CL 95* 98  CO2 28 23  GLUCOSE 254* 220*  BUN 49* 54*  CREATININE 2.63* 3.93*  CALCIUM 8.9 8.5*    Studies/Results: DG Chest Portable 1 View  Result Date: 07/05/2022 CLINICAL DATA:  Orogastric tube placement. EXAM: PORTABLE CHEST 1 VIEW COMPARISON:  Same day. FINDINGS: Nasogastric tube tip is seen in expected position of proximal stomach. IMPRESSION: Nasogastric tube tip seen in expected position of proximal stomach. Electronically Signed   By: Marijo Conception M.D.   On: 07/05/2022 17:23   DG Chest Port 1 View  Result Date: 07/05/2022 CLINICAL DATA:  Sore throat and pain with breathing. EXAM: PORTABLE CHEST 1 VIEW COMPARISON:  Chest radiograph 05/27/2022 FINDINGS: Evaluation is significantly degraded by poor image penetration. The endotracheal tube tip is approximately 2.8 cm from  the carina. Heart appears enlarged. The mediastinum is grossly within normal limits Lung volumes are low. There is vascular congestion and suspected mild pulmonary interstitial edema. There is suspected retrocardiac opacity there is no focal opacity on the right. There is a possible left pleural effusion. There is no significant right effusion. There is no definite pneumothorax. There is no definite acute osseous abnormality. Lucency projecting over the left hemiabdomen and lower left hemithorax is favored to be artifactual. IMPRESSION: 1. Image quality is degraded by patient positioning and poor image penetration. Within this confine: 2. Endotracheal tube tip approximately 2.8 cm from the carina. 3. Cardiomegaly with vascular congestion and suspected mild pulmonary interstitial edema. Possible left pleural effusion and retrocardiac opacity. Consider repeat imaging as indicated. Electronically Signed   By: Valetta Mole M.D.   On: 07/05/2022 16:46   CT Soft Tissue Neck W Contrast  Result Date: 07/05/2022 CLINICAL DATA:  Soft tissue swelling, infection suspected, neck x-ray done. EXAM: CT NECK WITH CONTRAST TECHNIQUE: Multidetector CT imaging of the neck was performed using the standard protocol following the bolus administration of intravenous contrast. RADIATION DOSE REDUCTION: This exam was performed according to the departmental dose-optimization program which includes automated exposure control, adjustment of the mA and/or kV according to patient size and/or use of iterative reconstruction technique. CONTRAST:  65m OMNIPAQUE IOHEXOL 300 MG/ML  SOLN COMPARISON:  None Available. FINDINGS: Mildly motion degraded exam. Pharynx and larynx: Streak and beam hardening artifact arising from dental restoration partially obscures the oral  cavity. Mucosal/submucosal edema within the left oropharynx without significant airway narrowing. No definite epiglottic/laryngeal swelling. No retropharyngeal collection. Salivary  glands: Foci of gas within the left submandibular gland. Edema/inflammatory stranding surrounding the left submandibular gland and extending into the ventral upper neck. Unremarkable appearance of the parotid and right submandibular glands. Thyroid: Unremarkable. Lymph nodes: Enlarged left level 2 and 3 lymph nodes, measuring up to 19 mm in short axis (for instance as seen on series 5, image 39). Vascular: The major vascular structures of the neck are patent. Atherosclerotic plaque about the carotid bifurcations. Limited intracranial: No evidence of acute intracranial abnormality within the field of view. Visualized orbits: Excluded from the field of view. Mastoids and visualized paranasal sinuses: No significant paranasal sinus disease or mastoid effusion at the imaged levels. Skeleton: Straightening of the expected cervical lordosis. No acute fracture or aggressive osseous lesion. Upper chest: No consolidation within the imaged lung apices. IMPRESSION: Edema and inflammatory stranding surrounding the left submandibular gland consistent with acute left submandibular sialoadenitis. Edema and inflammatory stranding also extend into the ventral upper neck. There are foci of gas within the left submandibular gland, which may be secondary to infection with a gas-producing organism, or potentially related to recent positive pressure ventilation. Mucosal/submucosal edema within the left oropharynx, suggesting pharyngitis. Left-sided cervical lymphadenopathy, as described and likely reactive. Electronically Signed   By: Kellie Simmering D.O.   On: 07/05/2022 10:39    Medications: I have reviewed the patient's current medications.  Assessment/Plan: Stable but white blood cell count has been very slowly decreasing.  Repeat CT of the neck might be useful to see if there is any evidence of advancing cellulitis or abscess.  Otherwise follow clinically and extubate when appropriate.  LOS: 2 days    Medical Decision  Making: #/Complex Problems: 3  Data Reviewed:3  Management:3 (1-Straightforward, 2-Low, 3-Moderate, 4-High)   Izora Gala 07/07/2022, 9:45 AM

## 2022-07-07 NOTE — Progress Notes (Signed)
On initial assessment of shift, patient found to have uneven, non-reactive pupils and not following commands. After reducing sedation patient has been following commands, nodding appropriately through the night though pupils have remained uneven and non-reactive. Frequent neurological assessments completed throughout this shift. Her limbs are equal in strength. Urine output remains low. Tmax on shift was 102.1, treated with ice packs and Tylenol. [employer]

## 2022-07-07 NOTE — Progress Notes (Addendum)
Initial Nutrition Assessment  DOCUMENTATION CODES:   Morbid obesity  INTERVENTION:   Initiate tube feeding via OG tube: Vital 1.5 at 50 ml/h (1200 ml per day) Prosource TF 45 ml TID  Provides 1920 kcal, 114 gm protein, 917 ml free water daily.  MVI with minerals via tube once daily.  NUTRITION DIAGNOSIS:   Inadequate oral intake related to inability to eat as evidenced by NPO status.  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 PMH includes recent eye surgery 7/28, HTN, HLD, CHF, CAD, COPD/asthma, OSA, DM-2, peripheral neuropathy, left BKA, R TMA, GERD, bipolar disorder.  Patient was intubated in the ED. OG tube in place with tip in the proximal stomach per x-ray. Received MD Consult for TF initiation and management.  Patient is currently intubated on ventilator support MV: 9.4 L/min Temp (24hrs), Avg:100.9 F (38.3 C), Min:98.9 F (37.2 C), Max:102.7 F (39.3 C)   Labs reviewed. Phos 7 CBG: 201-201-192  Medications reviewed and include fentanyl, Precedex, Colace, Novolog, Semglee, Protonix, Miralax.   Weight history reviewed. No significant weight changes noted.  NUTRITION - FOCUSED PHYSICAL EXAM:  Flowsheet Row Most Recent Value  Orbital Region No depletion  Upper Arm Region No depletion  Thoracic and Lumbar Region No depletion  Buccal Region No depletion  Temple Region No depletion  Clavicle Bone Region No depletion  Clavicle and Acromion Bone Region No depletion  Scapular Bone Region No depletion  Dorsal Hand No depletion  Patellar Region No depletion  Anterior Thigh Region No depletion  Posterior Calf Region No depletion  Edema (RD Assessment) Mild  Hair Reviewed  Eyes Unable to assess   Mouth Unable to assess  Skin Reviewed  Nails Reviewed       Diet Order:   Diet Order             Diet NPO time specified  Diet effective now                   EDUCATION NEEDS:   Not appropriate for education at this time  Skin:  Skin Assessment: Reviewed RN Assessment Other: MASD to abdomen  Last BM:  no BM documented  Height:   Ht Readings from Last 1 Encounters:  07/06/22 5' (1.524 m)    Weight:   Wt Readings from Last 1 Encounters:  07/07/22 (!) 154.2 kg    Ideal Body Weight:  42 kg adjusted for amputations  BMI:  Body mass index is 66.39 kg/m.  Estimated Nutritional Needs:   Kcal:  1800-2000  Protein:  105-115 gm  Fluid:  1.8-2 L   Lucas Mallow RD, LDN, CNSC Please refer to Amion for contact information.

## 2022-07-08 ENCOUNTER — Inpatient Hospital Stay (HOSPITAL_COMMUNITY): Payer: Medicaid Other

## 2022-07-08 DIAGNOSIS — J391 Other abscess of pharynx: Secondary | ICD-10-CM | POA: Diagnosis not present

## 2022-07-08 LAB — GLUCOSE, CAPILLARY
Glucose-Capillary: 314 mg/dL — ABNORMAL HIGH (ref 70–99)
Glucose-Capillary: 275 mg/dL — ABNORMAL HIGH (ref 70–99)
Glucose-Capillary: 285 mg/dL — ABNORMAL HIGH (ref 70–99)
Glucose-Capillary: 289 mg/dL — ABNORMAL HIGH (ref 70–99)
Glucose-Capillary: 232 mg/dL — ABNORMAL HIGH (ref 70–99)
Glucose-Capillary: 191 mg/dL — ABNORMAL HIGH (ref 70–99)
Glucose-Capillary: 177 mg/dL — ABNORMAL HIGH (ref 70–99)
Glucose-Capillary: 191 mg/dL — ABNORMAL HIGH (ref 70–99)
Glucose-Capillary: 172 mg/dL — ABNORMAL HIGH (ref 70–99)
Glucose-Capillary: 176 mg/dL — ABNORMAL HIGH (ref 70–99)
Glucose-Capillary: 122 mg/dL — ABNORMAL HIGH (ref 70–99)
Glucose-Capillary: 100 mg/dL — ABNORMAL HIGH (ref 70–99)
Glucose-Capillary: 104 mg/dL — ABNORMAL HIGH (ref 70–99)
Glucose-Capillary: 194 mg/dL — ABNORMAL HIGH (ref 70–99)

## 2022-07-08 LAB — BASIC METABOLIC PANEL
Anion gap: 17 — ABNORMAL HIGH (ref 5–15)
Anion gap: 13 (ref 5–15)
BUN: 73 mg/dL — ABNORMAL HIGH (ref 6–20)
BUN: 66 mg/dL — ABNORMAL HIGH (ref 6–20)
CO2: 25 mmol/L (ref 22–32)
CO2: 26 mmol/L (ref 22–32)
Calcium: 8.7 mg/dL — ABNORMAL LOW (ref 8.9–10.3)
Calcium: 8.4 mg/dL — ABNORMAL LOW (ref 8.9–10.3)
Chloride: 98 mmol/L (ref 98–111)
Chloride: 100 mmol/L (ref 98–111)
Creatinine, Ser: 6.15 mg/dL — ABNORMAL HIGH (ref 0.44–1.00)
Creatinine, Ser: 5.99 mg/dL — ABNORMAL HIGH (ref 0.44–1.00)
GFR, Estimated: 8 mL/min — ABNORMAL LOW (ref >60)
GFR, Estimated: 8 mL/min — ABNORMAL LOW (ref >60)
Glucose, Bld: 143 mg/dL — ABNORMAL HIGH (ref 70–99)
Glucose, Bld: 259 mg/dL — ABNORMAL HIGH (ref 70–99)
Potassium: 4.8 mmol/L (ref 3.5–5.1)
Potassium: 5.1 mmol/L (ref 3.5–5.1)
Sodium: 140 mmol/L (ref 135–145)
Sodium: 139 mmol/L (ref 135–145)

## 2022-07-08 LAB — CBC WITH DIFFERENTIAL/PLATELET
Abs Immature Granulocytes: 0.15 10*3/uL — ABNORMAL HIGH (ref 0.00–0.07)
Basophils Absolute: 0.1 10*3/uL (ref 0.0–0.1)
Basophils Relative: 0 %
Eosinophils Absolute: 0.1 10*3/uL (ref 0.0–0.5)
Eosinophils Relative: 1 %
HCT: 29 % — ABNORMAL LOW (ref 36.0–46.0)
Hemoglobin: 9.1 g/dL — ABNORMAL LOW (ref 12.0–15.0)
Immature Granulocytes: 1 %
Lymphocytes Relative: 21 %
Lymphs Abs: 2.9 10*3/uL (ref 0.7–4.0)
MCH: 29.7 pg (ref 26.0–34.0)
MCHC: 31.4 g/dL (ref 30.0–36.0)
MCV: 94.8 fL (ref 80.0–100.0)
Monocytes Absolute: 1.6 10*3/uL — ABNORMAL HIGH (ref 0.1–1.0)
Monocytes Relative: 11 %
Neutro Abs: 9.3 10*3/uL — ABNORMAL HIGH (ref 1.7–7.7)
Neutrophils Relative %: 66 %
Platelets: 332 10*3/uL (ref 150–400)
RBC: 3.06 MIL/uL — ABNORMAL LOW (ref 3.87–5.11)
RDW: 15 % (ref 11.5–15.5)
WBC: 14.2 10*3/uL — ABNORMAL HIGH (ref 4.0–10.5)
nRBC: 0 % (ref 0.0–0.2)

## 2022-07-08 LAB — URINALYSIS, COMPLETE (UACMP) WITH MICROSCOPIC
Bilirubin Urine: NEGATIVE
Glucose, UA: NEGATIVE mg/dL
Ketones, ur: NEGATIVE mg/dL
Nitrite: NEGATIVE
Protein, ur: 100 mg/dL — AB
Specific Gravity, Urine: 1.021 (ref 1.005–1.030)
pH: 5 (ref 5.0–8.0)

## 2022-07-08 LAB — PROTEIN / CREATININE RATIO, URINE
Creatinine, Urine: 130 mg/dL
Protein Creatinine Ratio: 1.7 mg/mg{Cre} — ABNORMAL HIGH (ref 0.00–0.15)
Total Protein, Urine: 221 mg/dL

## 2022-07-08 MED ORDER — INSULIN REGULAR(HUMAN) IN NACL 100-0.9 UT/100ML-% IV SOLN
INTRAVENOUS | Status: DC
Start: 2022-07-08 — End: 2022-07-09
  Administered 2022-07-08: 5.5 [IU]/h via INTRAVENOUS
  Administered 2022-07-08: 9.5 [IU]/h via INTRAVENOUS
  Filled 2022-07-08 (×2): qty 100

## 2022-07-08 MED ORDER — SENNOSIDES 8.8 MG/5ML PO SYRP
5.0000 mL | ORAL_SOLUTION | Freq: Two times a day (BID) | ORAL | Status: DC
Start: 2022-07-08 — End: 2022-07-14
  Administered 2022-07-08 – 2022-07-12 (×7): 5 mL
  Filled 2022-07-08 (×12): qty 5

## 2022-07-08 MED ORDER — INSULIN ASPART 100 UNIT/ML IJ SOLN
1.0000 [IU] | INTRAMUSCULAR | Status: DC
  Administered 2022-07-08 – 2022-07-10 (×12): 2 [IU] via SUBCUTANEOUS
  Administered 2022-07-10: 3 [IU] via SUBCUTANEOUS
  Administered 2022-07-10: 1 [IU] via SUBCUTANEOUS
  Administered 2022-07-11: 3 [IU] via SUBCUTANEOUS
  Administered 2022-07-11: 2 [IU] via SUBCUTANEOUS
  Administered 2022-07-11: 3 [IU] via SUBCUTANEOUS
  Administered 2022-07-11: 2 [IU] via SUBCUTANEOUS
  Administered 2022-07-12: 3 [IU] via SUBCUTANEOUS
  Administered 2022-07-12: 1 [IU] via SUBCUTANEOUS
  Administered 2022-07-12: 2 [IU] via SUBCUTANEOUS

## 2022-07-08 MED ORDER — FENTANYL BOLUS VIA INFUSION
50.0000 ug | INTRAVENOUS | Status: DC | PRN
  Administered 2022-07-09 (×2): 100 ug via INTRAVENOUS
  Administered 2022-07-09: 50 ug via INTRAVENOUS

## 2022-07-08 MED ORDER — GLUCERNA 1.5 CAL PO LIQD
1000.0000 mL | ORAL | Status: DC
  Administered 2022-07-08 – 2022-07-09 (×2): 1000 mL
  Filled 2022-07-08 (×3): qty 1000

## 2022-07-08 MED ORDER — FENTANYL 2500MCG IN NS 250ML (10MCG/ML) PREMIX INFUSION
50.0000 ug/h | INTRAVENOUS | Status: DC
  Administered 2022-07-08: 200 ug/h via INTRAVENOUS
  Administered 2022-07-09: 125 ug/h via INTRAVENOUS
  Administered 2022-07-09: 200 ug/h via INTRAVENOUS
  Filled 2022-07-08 (×3): qty 250

## 2022-07-08 MED ORDER — DEXTROSE 50 % IV SOLN: 0.0000 mL | INTRAVENOUS | Status: DC | PRN

## 2022-07-08 MED ORDER — INSULIN DETEMIR 100 UNIT/ML ~~LOC~~ SOLN
26.0000 [IU] | Freq: Two times a day (BID) | SUBCUTANEOUS | Status: DC
  Filled 2022-07-08: qty 0.26

## 2022-07-08 MED ORDER — INSULIN ASPART 100 UNIT/ML IJ SOLN
9.0000 [IU] | INTRAMUSCULAR | Status: DC
  Administered 2022-07-08 – 2022-07-12 (×16): 9 [IU] via SUBCUTANEOUS
  Administered 2022-07-12: 4 [IU] via SUBCUTANEOUS

## 2022-07-08 MED ORDER — POLYETHYLENE GLYCOL 3350 17 G PO PACK
17.0000 g | PACK | Freq: Two times a day (BID) | ORAL | Status: DC
  Administered 2022-07-08 – 2022-07-12 (×6): 17 g
  Filled 2022-07-08 (×8): qty 1

## 2022-07-08 MED ORDER — DEXTROSE 10 % IV SOLN
INTRAVENOUS | Status: DC | PRN
Start: 2022-07-08 — End: 2022-07-10

## 2022-07-08 MED ORDER — INSULIN DETEMIR 100 UNIT/ML ~~LOC~~ SOLN
26.0000 [IU] | Freq: Two times a day (BID) | SUBCUTANEOUS | Status: DC
  Administered 2022-07-08 – 2022-07-12 (×8): 26 [IU] via SUBCUTANEOUS
  Filled 2022-07-08 (×11): qty 0.26

## 2022-07-08 NOTE — Progress Notes (Addendum)
NAME:  [name], MRN:  [id number], DOB:  1976/05/08, LOS: 3 ADMISSION DATE:  07/05/2022 CONSULTATION DATE:  07/05/2022 REFERRING MD:  Jeani Hawking - FMTS CHIEF COMPLAINT:  Ludwig's angina, compromised airway   History of Present Illness:  46 year old woman who presented to [facility] ED 7/31 as a transfer from [facility] ED for further evaluation of progressively worsening throat pain and dysphagia. 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 PMHx significant for HTN, HLD, acute-on-chronic CHF, CAD, COPD/asthma, OSA on CPAP, T2DM (c/b diabetic peripheral neuropathy and bilateral BKAs), GERD, bipolar disorder.  Patient initially contacted family medicine after hours phone line regarding neck soreness and "mucus" in the back of her throat causing difficulty breathing.  Also reported dysphagia with inability to take PO.  At the time of the phone call denied any blood or associated cough with throat discomfort.  Patient presented to [facility] ED a few hours later via EMS with complaints of progressively worsening throat discomfort and dysphagia since eye surgery at [facility] 7/28.  At the time of presentation, patient was noted to have increasing fullness of the area below the angle of the mandible with associated redness and severe pain with concern for deep space infection of the neck, possibly Ludwig's angina vs. tracheal perforation.  Patient then developed trismus with concern for airway compromise.  ENT was called for evaluation.  Empiric cefepime/vanc were initiated.  Patient was transferred from [facility] ED to [facility] ED for further care and fiberoptic nasopharyngoscopy with ENT.  CT Neck 7/31 demonstrated edema and inflammatory stranding around the left submandibular gland, consistent with acute left submandibular sialoadenitis.  Edema and inflammatory stranding were also noted to extend into the ventral upper neck with foci of gas within the left submandibular gland, possibly secondary to infection with gas-producing organism and  associated pharyngitis.  While awaiting floor bed, patient decompensated from an airway protection standpoint, became stridorous and PCCM was consulted for admission.  On arrival to evaluate patient, anesthesia was preparing to intubate. Intubation was successful on second attempt.  Dr. Constance Holster with ENT was present at bedside during intubation, noting excessive purulent secretions but no significant swelling of the supraglottic larynx recommendation for conservative management with IV antibiotics.  PCCM admitted patient for further care.  Pertinent Medical History:   Past Medical History:  Diagnosis Date   Abscess of groin, left    Acute on chronic diastolic (congestive) heart failure ([place]) 02/05/2022   Acute on chronic respiratory failure with hypoxia ([name]) 04/11/2022   Acute osteomyelitis of ankle or foot, left ([place]) 01/31/2018   Acute respiratory failure with hypoxia and hypercarbia ([place]) 06/15/2020   Alveolar hypoventilation    Amputation stump infection ([place]) 04/09/2018   Anemia    not on iron pill   Asthma    Bipolar 2 disorder ([name])    Candidiasis of vagina 05/15/2022   Carpal tunnel syndrome on right    recurrent   Cellulitis 08/2010-08/2011   Chest pain with low risk for cardiac etiology 05/11/2021   Chronic pain    COPD (chronic obstructive pulmonary disease) ([name])    Symbicort daily and Proventil as needed   Costochondritis    De Quervain's tenosynovitis, bilateral 11/01/2015   Depression    Diabetes mellitus type II, uncontrolled 2000   Type 2, Uncontrolled.Takes Lantus daily.Fasting blood sugar runs 150   Diabetic ulcer of right foot ([place]) 12/19/2021   Drug-seeking behavior    Elevated troponin    Exposure to mold 04/21/2020   GERD (gastroesophageal reflux disease)  takes Pantoprazole and Zantac daily   HLD (hyperlipidemia)    takes Atorvastatin daily   Hypertension    takes Lisinopril and Coreg daily   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[place]) 04/11/2019   Left shoulder pain 06/23/2019    Morbid obesity ([place])    Morbid obesity with BMI of 60.0-69.9, adult ([place]) 04/15/2021   Nocturia    Nonobstructive atherosclerosis of coronary artery 11/26/2020   11/26/20 Coronary angiogram: 1.  Mild diffuse proximal LAD stenosis with no evidence of obstructive disease (20 to 30% diffuse stenosis);  2.  Left dominant circumflex with widely patent obtuse marginal branches and left PDA branch, no significant stenoses present  3.  Nondominant RCA with mild diffuse nonobstructive stenosis    OSA on CPAP    Peripheral neuropathy    takes Gabapentin daily   Pneumonia    "walking" several yrs ago and as a baby (12/05/2018)   Pneumonia due to COVID-19 virus 04/14/2021   Primary osteoarthritis of first carpometacarpal joint of left hand 07/30/2016   Rectal fissure    Restless leg    Right carpal tunnel syndrome 09/01/2011   SVT (supraventricular tachycardia) ([place])    Syncope 02/25/2016   Thrombocytosis 04/11/2022   Urinary frequency    Urinary incontinence 10/23/2020   UTI (urinary tract infection) 04/15/2021   Varicose veins    Right medial thigh and Left leg    Significant Hospital Events: Including procedures, antibiotic start and stop dates in addition to other pertinent events   7/28 - Underwent eye surgery at [place] 7/31 Presented to [facility] ED via EMS for progressively worsening throat pain and associated dysphagia, ENT consulted and recommended transfer to [facility] ED for nasopharyngeal scope.While awaiting bed became stridorous with concern for airway compromise and anesthesia called to intubate. Intubated in ED. PCCM consulted for admission and escalation of care. 8/1 - transitioned abx to Unasyn, MAP 50s > transitioned to Precedex and Fentanyl with Levophed if needed, development of AKI 8/2 - febrile to 102.1, start tube feeds 8/3 - nephrology consulted for worsening kidney function  Interim History / Subjective:  As above  Objective:  Blood pressure 131/69, pulse 88,  temperature 99.7 F (37.6 C), temperature source Esophageal, resp. rate 20, height 5' (1.524 m), weight (!) 155.8 kg, last menstrual period 07/05/2022, SpO2 97 %.    Vent Mode: PCV FiO2 (%):  [30 %] 30 % Set Rate:  [18 bmp] 18 bmp PEEP:  [8 cmH20] 8 cmH20 Plateau Pressure:  [20 cmH20-22 cmH20] 21 cmH20   Intake/Output Summary (Last 24 hours) at 07/08/2022 0959 Last data filed at 07/08/2022 0900 Gross per 24 hour  Intake 2705.36 ml  Output 331 ml  Net 2374.36 ml   Filed Weights   07/06/22 0500 07/07/22 0416 07/08/22 0457  Weight: 106.8 kg (!) 154.2 kg (!) 155.8 kg   Physical Examination: General: arousable to voice, follows commands HEENT: neck with submandibular swelling (generalized, L > R), nontender Neuro: Responds to verbal stimuli. Following commands intermittently. Moves all 4 extremities spontaneously.   CV: RRR, no m/g/r. PULM: Breathing even and unlabored on vent post-extubation (PEEP 8, FiO2 30%). Lung fields CTAB. GI: Obese, soft, nontender. Normoactive bowel sounds. 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 CT Neck with edema/inflammatory stranding around the left submandibular gland, consistent with acute left submandibular  sialoadenitis, extending into the ventral upper neck with foci of gas within the left submandibular gland, possibly secondary to infection with gas-producing organism. ENT consulted for evaluation, recommended transfer to [facility] for fiberoptic nasopharyngeal evaluation. Intubated in ED 7/31. Abx transitioned to Unasyn on 8/1. Continues to be febrile. Repeat CT neck unchanged. - ENT consulted, appreciate recommendations - Continue Unasyn 3g IV q6h - Keep intubated for airway protection; discuss extubation versus tracheostomy, see below for  plan -daily CBC w/ diff  Acute hypoxemic respiratory failure in the setting of deep soft tissue throat infection COPD OSA on CPAP - Continue full vent support (4-8cc/kg IBW) - Wean FiO2 for O2 sat > 90% - Daily WUA/SBT - VAP bundle - Bronchodilators (albuterol, DuoNeb, Brovana) - Pulmonary hygiene - PAD protocol for sedation: Precedex and Fentanyl for goal RASS 0 to -1 - Extubation will be limited by ongoing neck soft tissue swelling, risk of airway compromise; holding off on extubation given further complicated clinical course, reconsider in surgical setting with ENT present in 2-3 days given potential for difficult airway and need for tracheotomy  Acute on chronic diastolic heart failure CAD HTN HLD Echo 02/2021 with EF 55-60%, mild LVH. Normotensive.  - Monitor strict I&Os - Continue ASA - Hold home amlodipine for now, resume as appropriate - Cardiac monitoring  AKI on CKD3a Continues to worsen. Cr increased from 3.93>5.99. Likely multifactorial in the setting of Cefepime use (hx of AKI in past hospitalizations with use), low pressures, malnutrition. Foley catheter placed 8/1. Continues to have minimal output <0.1 mL/kg/hr. No electrolyte abnormality, acidosis, or signs of uremia currently.  - Nephrology consulted, appreciate recommendations - 5A&5P BMP - Hold home torsemide  Type 2 diabetes mellitus Bilateral lower extremity amputation Poorly controlled in the 200s-300s. Home regimen includes degludec 52u BID, Novolog 20u TID. Received Semglee 50u BID and Aspart 34u in the last 24 hours. - Initiate insulin drip - CBGs Q1H  Diabetic peripheral neuropathy - Hold home gabapentin, Cymbalta  At risk for malnutrition - Nutrition following, appreciate recommendations - Continue tube feeds, consider change given uncontrolled CBGs  Constipation Without bowel movements for last couple days.  - Senna BID, Miralax 17g BID,   Right 1st ray amputation Staple present which was to  be removed this week during outpatient appointment. Evaluated by [place] RN.  - Consulted podiatry, to see tomorrow  Bipolar 2 disorder - No meds noted to home med list  Best Practice: (right click and "Reselect all SmartList Selections" daily)   Diet/type: tubefeeds DVT prophylaxis: prophylactic heparin  GI prophylaxis: PPI Lines: N/A Foley:  Yes, and it is still needed Code Status:  full code Last date of multidisciplinary goals of care discussion [Pending]  Labs:  CBC: Recent Labs  Lab 07/05/22 0857 07/05/22 0905 07/05/22 1944 07/06/22 0518 07/06/22 0654 07/07/22 0129 07/08/22 0056  WBC 15.2*  --  16.0*  --  15.2* 15.1* 14.2*  NEUTROABS  --   --  11.8*  --   --  10.4* 9.3*  HGB 11.6*   < > 10.4* 11.6* 10.6* 10.3* 9.1*  HCT 36.3   < > 32.7* 34.0* 34.8* 32.9* 29.0*  MCV 93.1  --  92.4  --  96.4 94.8 94.8  PLT 358  --  350  --  351 363 332   < > = values in this interval not displayed.   Basic Metabolic Panel: Recent Labs  Lab 07/05/22 0857 07/05/22 0905 07/05/22 1659 07/05/22 1944 07/06/22 0518 07/06/22 0654 07/07/22 0129 07/08/22  0056  NA 134* 134*   < > 134* 135 135 135 139  K 4.9 4.4   < > 4.7 4.5 4.8 4.9 5.1  CL 95* 96*  --  98  --  95* 98 100  CO2 27  --   --  26  --  '28 23 26  ' GLUCOSE 312* 319*  --  322*  --  254* 220* 259*  BUN 47* 45*  --  44*  --  49* 54* 66*  CREATININE 1.15* 1.30*  --  1.53*  --  2.63* 3.93* 5.99*  CALCIUM 8.7*  --   --  8.5*  --  8.9 8.5* 8.4*  MG  --   --   --  2.2  --  2.4  --   --   PHOS  --   --   --  4.5  --  7.0*  --   --    < > = values in this interval not displayed.   GFR: Estimated Creatinine Clearance: 17 mL/min (A) (by C-G formula based on SCr of 5.99 mg/dL (H)). Recent Labs  Lab 07/05/22 0900 07/05/22 1944 07/06/22 0654 07/07/22 0129 07/08/22 0056  WBC  --  16.0* 15.2* 15.1* 14.2*  LATICACIDVEN 1.2 1.0  --   --   --    Liver Function Tests: Recent Labs  Lab 07/05/22 1944  AST 8*  ALT 13  ALKPHOS 113   BILITOT 1.1  PROT 7.9  ALBUMIN 2.8*   No results for input(s): "LIPASE", "AMYLASE" in the last 168 hours. No results for input(s): "AMMONIA" in the last 168 hours.  ABG:    Component Value Date/Time   PHART 7.272 (L) 07/06/2022 0518   PCO2ART 66.6 (HH) 07/06/2022 0518   PO2ART 410 (H) 07/06/2022 0518   HCO3 30.7 (H) 07/06/2022 0518   TCO2 33 (H) 07/06/2022 0518   ACIDBASEDEF 5.6 (H) 10/21/2021 1126   O2SAT 100 07/06/2022 0518   Coagulation Profile: No results for input(s): "INR", "PROTIME" in the last 168 hours.  Cardiac Enzymes: No results for input(s): "CKTOTAL", "CKMB", "CKMBINDEX", "TROPONINI" in the last 168 hours.  HbA1C: HbA1c POC (<> result, manual entry)  Date/Time Value Ref Range Status  09/29/2021 10:46 AM >15.0 4.0 - 5.6 % Final  04/03/2020 10:25 AM >15.0 4.0 - 5.6 % Final   Hgb A1c MFr Bld  Date/Time Value Ref Range Status  05/16/2022 01:43 AM 14.9 (H) 4.8 - 5.6 % Final    Comment:    (NOTE) Pre diabetes:          5.7%-6.4%  Diabetes:              >6.4%  Glycemic control for   <7.0% adults with diabetes   12/19/2021 08:00 AM >15.5 (H) 4.8 - 5.6 % Final    Comment:    (NOTE) **Verified by repeat analysis**         Prediabetes: 5.7 - 6.4         Diabetes: >6.4         Glycemic control for adults with diabetes: <7.0    CBG: Recent Labs  Lab 07/07/22 1902 07/07/22 2316 07/08/22 0310 07/08/22 0758 07/08/22 0902  GLUCAP 216* 231* 314* 275* 285*   Review of Systems:   Patient is encephalopathic and/or intubated. Therefore history has been obtained from chart review.   Past Medical History:  She,  has a past medical history of Abscess of groin, left, Acute on chronic diastolic (congestive) heart failure ([place]) (  02/05/2022), Acute on chronic respiratory failure with hypoxia ([place]) (04/11/2022), Acute osteomyelitis of ankle or foot, left ([place]) (01/31/2018), Acute respiratory failure with hypoxia and hypercarbia ([place]) (06/15/2020), Alveolar  hypoventilation, Amputation stump infection ([place]) (04/09/2018), Anemia, Asthma, Bipolar 2 disorder ([place]), Candidiasis of vagina (05/15/2022), Carpal tunnel syndrome on right, Cellulitis (08/2010-08/2011), Chest pain with low risk for cardiac etiology (05/11/2021), Chronic pain, COPD (chronic obstructive pulmonary disease) ([place]), Costochondritis, De Quervain's tenosynovitis, bilateral (11/01/2015), Depression, Diabetes mellitus type II, uncontrolled (2000), Diabetic ulcer of right foot ([place]) (12/19/2021), Drug-seeking behavior, Elevated troponin, Exposure to mold (04/21/2020), GERD (gastroesophageal reflux disease), HLD (hyperlipidemia), Hypertension, 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[place]) (04/11/2019), Left shoulder pain (06/23/2019), Morbid obesity ([place]), Morbid obesity with BMI of 60.0-69.9, adult ([place]) (04/15/2021), Nocturia, Nonobstructive atherosclerosis of coronary artery (11/26/2020), OSA on CPAP, Peripheral neuropathy, Pneumonia, Pneumonia due to COVID-19 virus (04/14/2021), Primary osteoarthritis of first carpometacarpal joint of left hand (07/30/2016), Rectal fissure, Restless leg, Right carpal tunnel syndrome (09/01/2011), SVT (supraventricular tachycardia) ([place]), Syncope (02/25/2016), Thrombocytosis (04/11/2022), Urinary frequency, Urinary incontinence (10/23/2020), UTI (urinary tract infection) (04/15/2021), and Varicose veins.   Surgical History:   Past Surgical History:  Procedure Laterality Date   AMPUTATION Left 02/01/2018   Procedure: LEFT FOURTH AND 5TH TOE RAY AMPUTATION;  Surgeon: [name], MD;  Location: [place];  Service: Orthopedics;  Laterality: Left;   AMPUTATION Left 03/03/2018   Procedure: LEFT BELOW KNEE AMPUTATION;  Surgeon: [name], MD;  Location: [place];  Service: Orthopedics;  Laterality: Left;   AMPUTATION Right 05/21/2022   Procedure: REVISION AMPUTATION RAY 1ST;  Surgeon: [name], DPM;  Location: [place];  Service: Podiatry;  Laterality: Right;   CARPAL TUNNEL RELEASE Bilateral     CESAREAN SECTION  2007   CORONARY ANGIOGRAPHY N/A 11/26/2020   Procedure: CORONARY ANGIOGRAPHY;  Surgeon: Sherren Mocha, MD;  Location: [place] CV LAB;  Service: Cardiovascular;  Laterality: N/A;   FOOT AMPUTATION Right    INCISION AND DRAINAGE ABSCESS Left 10/16/2021   Procedure: INCISION AND DRAINAGE ABSCESS;  Surgeon: Dwan Bolt, MD;  Location: [place];  Service: General;  Laterality: Left;   INCISION AND DRAINAGE PERIRECTAL ABSCESS Left 05/18/2019   Procedure: IRRIGATION AND DEBRIDEMENT OF PANNIS ABSCESS, POSSIBLE DEBRIDEMENT OF BUTTOCK WOUND;  Surgeon: Donnie Mesa, MD;  Location: [place];  Service: General;  Laterality: Left;   IRRIGATION AND DEBRIDEMENT BUTTOCKS Left 05/17/2019   Procedure: IRRIGATION AND DEBRIDEMENT BUTTOCKS;  Surgeon: Donnie Mesa, MD;  Location: [place];  Service: General;  Laterality: Left;   KNEE ARTHROSCOPY Right 07/17/2010   LEFT HEART CATHETERIZATION WITH CORONARY ANGIOGRAM N/A 07/27/2012   Procedure: LEFT HEART CATHETERIZATION WITH CORONARY ANGIOGRAM;  Surgeon: Sherren Mocha, MD;  Location: [facility] CATH LAB;  Service: Cardiovascular;  Laterality: N/A;   MASS EXCISION N/A 06/29/2013   Procedure:  WIDE LOCAL EXCISION OF POSTERIOR NECK ABSCESS;  Surgeon: Ralene Ok, MD;  Location: [place];  Service: General;  Laterality: N/A;   REPAIR KNEE LIGAMENT Left    "fixed ligaments and chipped patella"   right transmetatarsal amputation      Social History:   reports that she quit smoking about 16 years ago. Her smoking use included cigarettes. She has a 3.75 pack-year smoking history. She has never used smokeless tobacco. She reports that she does not currently use drugs. She reports that she does not drink alcohol.   Family History:  Her family history includes Allergic rhinitis in her mother; Anxiety disorder in her sister; Asthma in her child;  Cancer in her maternal grandmother; Depression in her mother; Diabetes in her maternal grandmother and mother; GER  disease in her mother; Heart attack in her father, paternal grandfather, paternal grandmother, and paternal uncle; Heart disease in her paternal grandfather and paternal grandmother; Hyperlipidemia in her maternal grandmother and mother; Hypertension in her maternal grandmother; Migraines in her sister; Other in her maternal grandfather; Restless legs syndrome in her mother.   Allergies: Allergies  Allergen Reactions   Cefepime Other (See Comments)    AKI, see records from [place] hospitalization in January 2020.   Other reaction(s): Other (See Comments) "Shut down organs/kidneys"  Note pt has tolerated Rocephin and Keflex   Iodine Other (See Comments)    Kidney dysfunction   Kiwi Extract Shortness Of Breath, Swelling and Anaphylaxis   Morphine And Related Nausea And Vomiting   Nalbuphine Other (See Comments)    Hallucinations     Trental [Pentoxifylline] Nausea And Vomiting   Cephalosporins Other (See Comments)    Pt states that they cause her kidneys to shut down   Toradol [Ketorolac Tromethamine] Other (See Comments)    Feels like something is crawling on me   Morphine Nausea And Vomiting   Nubain [Nalbuphine Hcl] Other (See Comments)    "FEELS LIKE SOMETHING CRAWLING ON ME"    Home Medications: Prior to Admission medications   Medication Sig Start Date End Date Taking? Authorizing Provider  acetaminophen (TYLENOL) 325 MG tablet Take 1-2 tablets (325-650 mg total) by mouth every 4 (four) hours as needed for mild pain. 06/04/22  Yes Setzer, Edman Circle, PA-C  albuterol (VENTOLIN HFA) 108 (90 Base) MCG/ACT inhaler INHALE 1 TO 2 PUFFS BY MOUTH EVERY SIX HOURS AS NEEDED FOR WHEEZING OR SHORTNESS OF BREATH Patient taking differently: Inhale 1 puff into the lungs every 6 (six) hours as needed for shortness of breath. 07/16/21  Yes [name], MD  amLODipine (NORVASC) 10 MG tablet Take 1 tablet (10 mg total) by mouth daily. 06/04/22  Yes Setzer, Edman Circle, PA-C  aspirin EC 81 MG tablet  Take 1 tablet (81 mg total) by mouth daily. Swallow whole. 11/19/20  Yes Sherren Mocha, MD  atropine 1 % ophthalmic solution Place 1 drop into the right eye daily.   Yes [provider]  Brinzolamide-Brimonidine (SIMBRINZA) 1-0.2 % SUSP Place 1 drop into the left eye in the morning, at noon, and at bedtime.   Yes [provider]  cetirizine (ZYRTEC) 10 MG tablet Take 1 tablet (10 mg total) by mouth daily as needed for allergies. 02/11/22  Yes [name], MD  Cholecalciferol 1000 units tablet Take 1 tablet (1,000 Units total) by mouth daily. 04/05/17  Yes [name], MD  DULoxetine (CYMBALTA) 30 MG capsule Take 1 capsule (30 mg total) by mouth daily. X 1 week; then 60 mg daily- for knee and back/nerve pain Patient taking differently: Take 60 mg by mouth daily. 06/04/22  Yes Setzer, Edman Circle, PA-C  gabapentin (NEURONTIN) 600 MG tablet Take 2 tablets (1,200 mg total) by mouth 2 (two) times daily. 06/04/22  Yes Setzer, Edman Circle, PA-C  insulin aspart (NOVOLOG FLEXPEN) 100 UNIT/ML FlexPen Inject 20 Units into the skin 3 (three) times daily with meals. 05/25/22  Yes Alcus Dad, MD  insulin degludec (TRESIBA FLEXTOUCH) 200 UNIT/ML FlexTouch Pen Inject 52 Units into the skin 2 (two) times daily. 06/04/22  Yes Setzer, Edman Circle, PA-C  latanoprost (XALATAN) 0.005 % ophthalmic solution Place 1 drop into the left eye at bedtime.  06/29/22  Yes [provider]  moxifloxacin (VIGAMOX) 0.5 % ophthalmic solution Place 1 drop into the right eye 4 (four) times daily.   Yes [provider]  neomycin-polymyxin b-dexamethasone (MAXITROL) 3.5-10000-0.1 OINT Place 1 Application into the right eye at bedtime.   Yes [provider]  nystatin (MYCOSTATIN/NYSTOP) powder Apply 1 application. topically 3 (three) times daily. 03/30/22  Yes [name], MD  ondansetron (ZOFRAN) 4 MG tablet Take 1 tablet (4 mg total) by mouth every 8 (eight) hours as needed for  nausea or vomiting. 02/17/22  Yes Lovorn, Jinny Blossom, MD  oxyCODONE-acetaminophen (PERCOCET) 7.5-325 MG tablet Take 1 tablet by mouth every 6 (six) hours as needed for severe pain. 04/27/22  Yes [name], MD  prednisoLONE acetate (PRED FORTE) 1 % ophthalmic suspension Place 1 drop into both eyes 4 (four) times daily. Patient taking differently: Place 1 drop into the right eye 4 (four) times daily. 06/04/22  Yes Setzer, Edman Circle, PA-C  SYMBICORT 160-4.5 MCG/ACT inhaler inhale 2 PUFFS into THE lungs 2 TIMES DAILY Patient taking differently: 2 puffs every 6 (six) hours. Shortness of breath 01/04/22  Yes Hensel, Jamal Collin, MD  Tiotropium Bromide Monohydrate (SPIRIVA RESPIMAT) 1.25 MCG/ACT AERS Inhale 2 puffs into the lungs daily. 11/14/20  Yes Martyn Ehrich, NP  torsemide 40 MG TABS Take 40 mg by mouth 2 (two) times daily. 06/04/22  Yes Setzer, Edman Circle, PA-C  traZODone (DESYREL) 50 MG tablet Take 0.5-1 tablets (25-50 mg total) by mouth at bedtime as needed for sleep. 06/04/22  Yes Setzer, Edman Circle, PA-C  triamcinolone ointment (KENALOG) 0.5 % Apply 1 Application topically 2 (two) times daily. 06/25/22  Yes Ganta, Anupa, DO  Accu-Chek FastClix Lancets MISC USE DAILY AS INSTRUCTED 05/18/21   [name], MD  ACCU-CHEK GUIDE test strip USE T0 CHECK BLOOD SUGAR 3 TIMES DAILY 11/05/21   [name], MD  Accu-Chek Softclix Lancets lancets Use to check blood glucose four times daily 04/17/20   Martyn Malay, MD  Continuous Blood Gluc Sensor (DEXCOM G6 SENSOR) MISC Inject 1 applicator into the skin as directed. Change sensor every 10 days. 11/05/21   [name], MD  Continuous Blood Gluc Transmit (DEXCOM G6 TRANSMITTER) MISC Inject 1 Device into the skin as directed. Reuse 8 times with sensor changes. 09/03/21   [name], MD  Glucagon (BAQSIMI ONE PACK) 3 MG/DOSE POWD Place 3 mg into the nose as needed (low blood sugar).    [provider]  Glucagon, rDNA,  (GLUCAGON EMERGENCY) 1 MG KIT Inject 1 mg into the muscle as needed for low blood sugar. 11/03/21   [provider]  GNP ULTICARE PEN NEEDLES 32G X 4 MM MISC USE TO inject insulin 2 TIMES DAILY 10/02/21   [name], MD    Critical care time: 30 minutes   The patient is critically ill with multiple organ system failure and requires high complexity decision making for assessment and support, frequent evaluation and titration of therapies, advanced monitoring, review of radiographic studies and interpretation of complex data.   Critical Care Time devoted to patient care services, exclusive of separately billable procedures, described in this note is 30 minutes.  Wells Guiles, DO 07/08/2022, 9:59 AM PGY-2, [place]   Please see [email or web address] for pager details.  From 7A-7P if no response, please call [phone] After hours, please call ELink [phone]

## 2022-07-08 NOTE — Progress Notes (Signed)
Subjective: Concerned about renal failure now.  Otherwise no significant changes.  Objective: Vital signs in last 24 hours: Temp:  [99.7 F (37.6 C)-103.5 F (39.7 C)] 99.7 F (37.6 C) (08/03 0800) Pulse Rate:  [85-119] 92 (08/03 0830) Resp:  [12-28] 19 (08/03 0830) BP: (98-143)/(23-90) 132/85 (08/03 0830) SpO2:  [92 %-100 %] 97 % (08/03 0830) FiO2 (%):  [30 %] 30 % (08/03 0741) Weight:  [155.8 kg] 155.8 kg (08/03 0457) Weight change: 1.6 kg Last BM Date :  (PTA)  Intake/Output from previous day: 08/02 0701 - 08/03 0700 In: 2313.8 [I.V.:1185.4; NG/GT:928.3; IV Piggyback:200] Out: 236 [Urine:236] Intake/Output this shift: Total I/O In: 105.6 [I.V.:45.5; NG/GT:50; IV Piggyback:10.1] Out: 75 [Urine:75]  PHYSICAL EXAM: Intubated and sedated but easily arousable.  Neck exam unchanged.  Lab Results: Recent Labs    07/07/22 0129 07/08/22 0056  WBC 15.1* 14.2*  HGB 10.3* 9.1*  HCT 32.9* 29.0*  PLT 363 332   BMET Recent Labs    07/07/22 0129 07/08/22 0056  NA 135 139  K 4.9 5.1  CL 98 100  CO2 23 26  GLUCOSE 220* 259*  BUN 54* 66*  CREATININE 3.93* 5.99*  CALCIUM 8.5* 8.4*    Studies/Results: CT SOFT TISSUE NECK WO CONTRAST  Result Date: 07/08/2022 CLINICAL DATA:  Soft tissue swelling with infection suspected. EXAM: CT NECK WITHOUT CONTRAST TECHNIQUE: Multidetector CT imaging of the neck was performed following the standard protocol without intravenous contrast. RADIATION DOSE REDUCTION: This exam was performed according to the departmental dose-optimization program which includes automated exposure control, adjustment of the mA and/or kV according to patient size and/or use of iterative reconstruction technique. COMPARISON:  07/05/2022 FINDINGS: Pharynx and larynx: Edematous appearance of the epiglottis and left pharyngeal wall. Continued inflammation centered in the left submandibular space with clustered gas bubbles measuring up to 2.2 cm craniocaudal. Subcutaneous  edema in the chin has slightly improved. Located endotracheal and enteric tubes where covered. No visible fluid collection. Salivary glands: Inflammation around the left submandibular gland. 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 No acute finding. Mastoids and visualized paranasal sinuses: Clear Skeleton: No acute or aggressive finding Upper chest: Clear apical lungs. IMPRESSION: Unchanged supraglottitis and left submandibular cellulitis with soft tissue gas in the left submandibular space. Electronically Signed   By: Jorje Guild M.D.   On: 07/08/2022 05:01    Medications: I have reviewed the patient's current medications.  Assessment/Plan: I reviewed the CT scan.  There is no drainable abscess.  Still appears to show evidence of infection around the left side of the pharynx probably from intubation injury.  I discussed with pulmonary critical care plan.  I would recommend attempted extubation.  If she fails that then I think tracheostomy is necessary.  We discussed either doing it in the ICU or in the operating room and decided it would be safer done in the operating room.  I will await plan for possible hemodialysis catheter in the next day or 2 and then plan extubation following that in the operating room.  LOS: 3 days    Medical Decision Making: #/Complex Problems: 4  Data Reviewed:3  Management:4 (1-Straightforward, 2-Low, 3-Moderate, 4-High)   [name] 07/08/2022, 8:43 AM

## 2022-07-08 NOTE — Progress Notes (Signed)
[facility] Progress Note Patient Name: [name] DOB: [birth year] MRN: [id number]   Date of Service  07/08/2022  HPI/Events of Note  Patient with temperature of 103.2. Creatinine is rising.  eICU Interventions  Cooling blanket ordered. Nephrology consultation.        Kerry Kass Clinton Dragone 07/08/2022, 4:14 AM

## 2022-07-08 NOTE — Progress Notes (Signed)
Nutrition Follow-up  DOCUMENTATION CODES:   Morbid obesity  INTERVENTION:   Tube feeding via OG tube: Glucerna 1.5 at 55 ml/h (1320 ml per day) D/C Prosource TF  Provides 1980 kcal, 109 gm protein, 1002 ml free water daily.  Continue MVI with minerals via tube once daily.  NUTRITION DIAGNOSIS:   Inadequate oral intake related to inability to eat as evidenced by NPO status.  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 PMH includes recent eye surgery 7/28, HTN, HLD, CHF, CAD, COPD/asthma, OSA, DM-2, peripheral neuropathy, left BKA, R TMA, GERD, bipolar disorder.  Discussed patient in ICU rounds and with RN today. Patient is tolerating TF without difficulty. She is now on an insulin drip. CBGs have been in the upper 200s to low 300s. RD to change TF to Glucerna 1.5 to help with glycemic control. Patient may need to start dialysis; awaiting Nephrology consult.  Patient remains intubated on ventilator support, plans for extubation in the OR with ENT later this week hopefully.  MV: 9.3 L/min Temp (24hrs), Avg:101.6 F (38.7 C), Min:98.2 F (36.8 C), Max:103.5 F (39.7 C)   Labs reviewed.  CBG: [phone]  Medications reviewed and include insulin drip, Precedex, Colace, MVI with minerals, Protonix, Miralax, Senokot.   Diet Order:   Diet Order             Diet NPO time specified  Diet effective now                   EDUCATION NEEDS:   Not appropriate for education at this time  Skin:  Skin Assessment: Reviewed RN Assessment Other: MASD to abdomen  Last BM:  No BM documented  Height:   Ht Readings from Last 1 Encounters:  07/06/22 5' (1.524 m)    Weight:   Wt Readings from Last 1  Encounters:  07/08/22 (!) 155.8 kg    Ideal Body Weight:  42 kg adjusted for amputations  BMI:  Body mass index is 67.08 kg/m.  Estimated Nutritional Needs:   Kcal:  1800-2000  Protein:  105-115 gm  Fluid:  1.8-2 L   Lucas Mallow RD, LDN, CNSC Please refer to Amion for contact information.

## 2022-07-08 NOTE — Consult Note (Signed)
[facility] Renal Consultation Note  Requesting MD: Baltazar Apo, MD Indication for Consultation:  AKI  Chief complaint: throat pain and swelling  HPI:  [name] is a 46 y.o. female with a history of chronic diastolic CHF chronic kidney disease, bipolar disorder, COPD, type 2 DM, HTN, morbid and obstructive sleep apnea who presented to the hospital on 7/31 with throat pain and swelling.  She was evaluated by ENT and transferred from [facility] to [facility] ER and the was intubated after arrival to [facility]; noted to be a difficult intubation.  She was admitted to the ICU.  She was recently hospitalized for eye surgery requiring intubation per charting and ENT was concerned for possible trauma from same resulting in infection.  Ultimately found to have pharyngeal abscess and has had severe sepsis.  Nephrology is consulted for assitance with management of AKI.  Creatinine trends as below; on admission Cr was 1.15 to rose to 3.93 by 8/2 and ultimately 5.99 on 8/3, the date of consult.  I spoke with her mother via phone.  The patient has previously expressed that she would never want long-term dialysis but would accept dialysis for a short-term event such as AKI.  She and I have discussed the risks/benefits/indications for dialysis and she does consent to proceed with dialysis if needed.  She has had an AKI event in 10/2021 and per that note had AKI almost requiring dialysis a few years prior to that.  Per bedside RN the patient was upset earlier today to learn about her kidney function and began to cry.  Her intubation was quite difficult and bedside RN reports that whenever she is extubated that it will need to be in the OR given possible need for trach.  She had 245 mL uop over 8/3 thus far and 236 mL uop over 8/2.  Had 1.5 liters UOP on 7/31.  She has been on unasyn here.  Was on LR continuously for at last 8/1.  Rec'd vanc on 7/31. Cefepime ordered but not given - appears was changed to  unasyn.    Creatinine, Ser  Date/Time Value Ref Range Status  07/08/2022 12:56 AM 5.99 (H) 0.44 - 1.00 mg/dL Final  07/07/2022 01:29 AM 3.93 (H) 0.44 - 1.00 mg/dL Final    Comment:    DELTA CHECK NOTED  07/06/2022 06:54 AM 2.63 (H) 0.44 - 1.00 mg/dL Final    Comment:    RESULTS VERIFIED BY REPEAT TESTING  07/05/2022 07:44 PM 1.53 (H) 0.44 - 1.00 mg/dL Final  07/05/2022 09:05 AM 1.30 (H) 0.44 - 1.00 mg/dL Final  07/05/2022 08:57 AM 1.15 (H) 0.44 - 1.00 mg/dL Final  06/02/2022 05:30 AM 1.30 (H) 0.44 - 1.00 mg/dL Final  06/01/2022 06:14 AM 1.37 (H) 0.44 - 1.00 mg/dL Final  05/31/2022 06:12 AM 1.71 (H) 0.44 - 1.00 mg/dL Final  05/28/2022 05:00 AM 1.67 (H) 0.44 - 1.00 mg/dL Final  05/27/2022 05:37 AM 1.83 (H) 0.44 - 1.00 mg/dL Final  05/26/2022 06:54 AM 1.51 (H) 0.44 - 1.00 mg/dL Final  05/25/2022 04:01 AM 1.53 (H) 0.44 - 1.00 mg/dL Final  05/24/2022 03:28 AM 1.28 (H) 0.44 - 1.00 mg/dL Final  05/23/2022 03:05 AM 1.72 (H) 0.44 - 1.00 mg/dL Final  05/22/2022 02:57 AM 1.89 (H) 0.44 - 1.00 mg/dL Final  05/21/2022 04:15 AM 1.76 (H) 0.44 - 1.00 mg/dL Final  05/20/2022 04:05 AM 1.76 (H) 0.44 - 1.00 mg/dL Final  05/19/2022 03:28 AM 2.15 (H) 0.44 - 1.00 mg/dL Final  05/18/2022 12:42  AM 2.57 (H) 0.44 - 1.00 mg/dL Final  05/17/2022 02:03 AM 2.48 (H) 0.44 - 1.00 mg/dL Final  05/16/2022 01:43 AM 1.87 (H) 0.44 - 1.00 mg/dL Final  05/15/2022 02:17 PM 1.80 (H) 0.44 - 1.00 mg/dL Final  05/15/2022 03:51 AM 1.70 (H) 0.44 - 1.00 mg/dL Final  05/15/2022 01:17 AM 1.62 (H) 0.44 - 1.00 mg/dL Final  05/14/2022 09:22 PM 1.46 (H) 0.44 - 1.00 mg/dL Final  05/14/2022 03:38 PM 1.63 (H) 0.44 - 1.00 mg/dL Final  05/14/2022 11:53 AM 1.30 (H) 0.44 - 1.00 mg/dL Final  05/14/2022 11:25 AM 1.57 (H) 0.44 - 1.00 mg/dL Final  04/14/2022 04:14 AM 1.55 (H) 0.44 - 1.00 mg/dL Final  04/13/2022 04:19 AM 1.62 (H) 0.44 - 1.00 mg/dL Final  04/12/2022 03:49 AM 1.72 (H) 0.44 - 1.00 mg/dL Final  04/11/2022 05:27 AM 1.31 (H) 0.44 -  1.00 mg/dL Final  02/07/2022 03:57 AM 1.21 (H) 0.44 - 1.00 mg/dL Final  02/07/2022 03:57 AM 1.17 (H) 0.44 - 1.00 mg/dL Final  02/06/2022 03:38 AM 1.35 (H) 0.44 - 1.00 mg/dL Final  02/05/2022 01:10 AM 1.41 (H) 0.44 - 1.00 mg/dL Final  12/21/2021 01:57 AM 1.18 (H) 0.44 - 1.00 mg/dL Final  12/20/2021 02:38 AM 1.62 (H) 0.44 - 1.00 mg/dL Final  12/19/2021 08:11 AM 1.40 (H) 0.44 - 1.00 mg/dL Final  12/18/2021 11:28 PM 1.76 (H) 0.44 - 1.00 mg/dL Final  11/04/2021 11:05 AM 1.38 (H) 0.57 - 1.00 mg/dL Final  10/25/2021 06:10 AM 1.44 (H) 0.44 - 1.00 mg/dL Final  10/24/2021 05:46 AM 2.38 (H) 0.44 - 1.00 mg/dL Final  10/23/2021 02:19 AM 3.05 (H) 0.44 - 1.00 mg/dL Final  10/22/2021 02:11 AM 4.39 (H) 0.44 - 1.00 mg/dL Final  10/21/2021 06:06 AM 5.17 (H) 0.44 - 1.00 mg/dL Final  10/20/2021 04:30 AM 5.62 (H) 0.44 - 1.00 mg/dL Final  10/19/2021 12:30 PM 5.59 (H) 0.44 - 1.00 mg/dL Final  10/19/2021 01:45 AM 4.99 (H) 0.44 - 1.00 mg/dL Final  10/18/2021 06:06 PM 4.55 (H) 0.44 - 1.00 mg/dL Final  10/18/2021 12:23 PM 4.62 (H) 0.44 - 1.00 mg/dL Final     PMHx:   Past Medical History:  Diagnosis Date   Abscess of groin, left    Acute on chronic diastolic (congestive) heart failure ([place]) 02/05/2022   Acute on chronic respiratory failure with hypoxia ([place]) 04/11/2022   Acute osteomyelitis of ankle or foot, left ([place]) 01/31/2018   Acute respiratory failure with hypoxia and hypercarbia ([place]) 06/15/2020   Alveolar hypoventilation    Amputation stump infection ([place]) 04/09/2018   Anemia    not on iron pill   Asthma    Bipolar 2 disorder ([name])    Candidiasis of vagina 05/15/2022   Carpal tunnel syndrome on right    recurrent   Cellulitis 08/2010-08/2011   Chest pain with low risk for cardiac etiology 05/11/2021   Chronic pain    COPD (chronic obstructive pulmonary disease) ([name])    Symbicort daily and Proventil as needed   Costochondritis    De Quervain's tenosynovitis, bilateral 11/01/2015   Depression     Diabetes mellitus type II, uncontrolled 2000   Type 2, Uncontrolled.Takes Lantus daily.Fasting blood sugar runs 150   Diabetic ulcer of right foot ([place]) 12/19/2021   Drug-seeking behavior    Elevated troponin    Exposure to mold 04/21/2020   GERD (gastroesophageal reflux disease)    takes Pantoprazole and Zantac daily   HLD (hyperlipidemia)    takes Atorvastatin daily   Hypertension  takes Lisinopril and Coreg daily   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[place]) 04/11/2019   Left shoulder pain 06/23/2019   Morbid obesity ([place])    Morbid obesity with BMI of 60.0-69.9, adult ([place]) 04/15/2021   Nocturia    Nonobstructive atherosclerosis of coronary artery 11/26/2020   11/26/20 Coronary angiogram: 1.  Mild diffuse proximal LAD stenosis with no evidence of obstructive disease (20 to 30% diffuse stenosis);  2.  Left dominant circumflex with widely patent obtuse marginal branches and left PDA branch, no significant stenoses present  3.  Nondominant RCA with mild diffuse nonobstructive stenosis    OSA on CPAP    Peripheral neuropathy    takes Gabapentin daily   Pneumonia    "walking" several yrs ago and as a baby (12/05/2018)   Pneumonia due to COVID-19 virus 04/14/2021   Primary osteoarthritis of first carpometacarpal joint of left hand 07/30/2016   Rectal fissure    Restless leg    Right carpal tunnel syndrome 09/01/2011   SVT (supraventricular tachycardia) ([place])    Syncope 02/25/2016   Thrombocytosis 04/11/2022   Urinary frequency    Urinary incontinence 10/23/2020   UTI (urinary tract infection) 04/15/2021   Varicose veins    Right medial thigh and Left leg     Past Surgical History:  Procedure Laterality Date   AMPUTATION Left 02/01/2018   Procedure: LEFT FOURTH AND 5TH TOE RAY AMPUTATION;  Surgeon: [name], MD;  Location: [place];  Service: Orthopedics;  Laterality: Left;   AMPUTATION Left 03/03/2018   Procedure: LEFT BELOW KNEE AMPUTATION;  Surgeon: [name], MD;  Location: [place];   Service: Orthopedics;  Laterality: Left;   AMPUTATION Right 05/21/2022   Procedure: REVISION AMPUTATION RAY 1ST;  Surgeon: [name], DPM;  Location: [place];  Service: Podiatry;  Laterality: Right;   CARPAL TUNNEL RELEASE Bilateral    CESAREAN SECTION  2007   CORONARY ANGIOGRAPHY N/A 11/26/2020   Procedure: CORONARY ANGIOGRAPHY;  Surgeon: Sherren Mocha, MD;  Location: [place] [facility];  Service: Cardiovascular;  Laterality: N/A;   FOOT AMPUTATION Right    INCISION AND DRAINAGE ABSCESS Left 10/16/2021   Procedure: INCISION AND DRAINAGE ABSCESS;  Surgeon: Dwan Bolt, MD;  Location: [place];  Service: General;  Laterality: Left;   INCISION AND DRAINAGE PERIRECTAL ABSCESS Left 05/18/2019   Procedure: IRRIGATION AND DEBRIDEMENT OF PANNIS ABSCESS, POSSIBLE DEBRIDEMENT OF BUTTOCK WOUND;  Surgeon: Donnie Mesa, MD;  Location: [place];  Service: General;  Laterality: Left;   IRRIGATION AND DEBRIDEMENT BUTTOCKS Left 05/17/2019   Procedure: IRRIGATION AND DEBRIDEMENT BUTTOCKS;  Surgeon: Donnie Mesa, MD;  Location: [place];  Service: General;  Laterality: Left;   KNEE ARTHROSCOPY Right 07/17/2010   LEFT HEART CATHETERIZATION WITH CORONARY ANGIOGRAM N/A 07/27/2012   Procedure: LEFT HEART CATHETERIZATION WITH CORONARY ANGIOGRAM;  Surgeon: Sherren Mocha, MD;  Location: [facility];  Service: Cardiovascular;  Laterality: N/A;   MASS EXCISION N/A 06/29/2013   Procedure:  WIDE LOCAL EXCISION OF POSTERIOR NECK ABSCESS;  Surgeon: Ralene Ok, MD;  Location: [place];  Service: General;  Laterality: N/A;   REPAIR KNEE LIGAMENT Left    "fixed ligaments and chipped patella"   right transmetatarsal amputation       Family Hx:  Family History  Problem Relation Age of Onset   Diabetes Mother    Hyperlipidemia Mother    Depression Mother    GER disease Mother    Allergic rhinitis Mother    Restless legs syndrome  Mother    Heart attack Paternal Uncle    Heart disease Paternal Grandmother    Heart  attack Paternal Grandmother    Heart attack Paternal Grandfather    Heart disease Paternal Grandfather    Heart attack Father    Migraines Sister    Cancer Maternal Grandmother        COLON   Hypertension Maternal Grandmother    Hyperlipidemia Maternal Grandmother    Diabetes Maternal Grandmother    Other Maternal Grandfather        GUN SHOT   Anxiety disorder Sister    Asthma Child     Social History:  reports that she quit smoking about 16 years ago. Her smoking use included cigarettes. She has a 3.75 pack-year smoking history. She has never used smokeless tobacco. She reports that she does not currently use drugs. She reports that she does not drink alcohol.  Allergies:  Allergies  Allergen Reactions   Cefepime Other (See Comments)    AKI, see records from [place] hospitalization in January 2020.   Other reaction(s): Other (See Comments) "Shut down organs/kidneys"  Note pt has tolerated Rocephin and Keflex   Iodine Other (See Comments)    Kidney dysfunction   Kiwi Extract Shortness Of Breath, Swelling and Anaphylaxis   Morphine And Related Nausea And Vomiting   Nalbuphine Other (See Comments)    Hallucinations     Trental [Pentoxifylline] Nausea And Vomiting   Cephalosporins Other (See Comments)    Pt states that they cause her kidneys to shut down   Toradol [Ketorolac Tromethamine] Other (See Comments)    Feels like something is crawling on me   Morphine Nausea And Vomiting   Nubain [Nalbuphine Hcl] Other (See Comments)    "FEELS LIKE SOMETHING CRAWLING ON ME"    Medications: Prior to Admission medications   Medication Sig Start Date End Date Taking? Authorizing Provider  acetaminophen (TYLENOL) 325 MG tablet Take 1-2 tablets (325-650 mg total) by mouth every 4 (four) hours as needed for mild pain. 06/04/22  Yes Setzer, Edman Circle, PA-C  albuterol (VENTOLIN HFA) 108 (90 Base) MCG/ACT inhaler INHALE 1 TO 2 PUFFS BY MOUTH EVERY SIX HOURS AS NEEDED FOR WHEEZING OR  SHORTNESS OF BREATH Patient taking differently: Inhale 1 puff into the lungs every 6 (six) hours as needed for shortness of breath. 07/16/21  Yes [name], MD  amLODipine (NORVASC) 10 MG tablet Take 1 tablet (10 mg total) by mouth daily. 06/04/22  Yes Setzer, Edman Circle, PA-C  aspirin EC 81 MG tablet Take 1 tablet (81 mg total) by mouth daily. Swallow whole. 11/19/20  Yes Sherren Mocha, MD  atropine 1 % ophthalmic solution Place 1 drop into the right eye daily.   Yes [provider]  Brinzolamide-Brimonidine (SIMBRINZA) 1-0.2 % SUSP Place 1 drop into the left eye in the morning, at noon, and at bedtime.   Yes [provider]  cetirizine (ZYRTEC) 10 MG tablet Take 1 tablet (10 mg total) by mouth daily as needed for allergies. 02/11/22  Yes [name], MD  Cholecalciferol 1000 units tablet Take 1 tablet (1,000 Units total) by mouth daily. 04/05/17  Yes [name], MD  DULoxetine (CYMBALTA) 30 MG capsule Take 1 capsule (30 mg total) by mouth daily. X 1 week; then 60 mg daily- for knee and back/nerve pain Patient taking differently: Take 60 mg by mouth daily. 06/04/22  Yes Setzer, Edman Circle, PA-C  gabapentin (NEURONTIN) 600 MG tablet Take 2 tablets (  1,200 mg total) by mouth 2 (two) times daily. 06/04/22  Yes Setzer, Edman Circle, PA-C  insulin aspart (NOVOLOG FLEXPEN) 100 UNIT/ML FlexPen Inject 20 Units into the skin 3 (three) times daily with meals. 05/25/22  Yes Alcus Dad, MD  insulin degludec (TRESIBA FLEXTOUCH) 200 UNIT/ML FlexTouch Pen Inject 52 Units into the skin 2 (two) times daily. 06/04/22  Yes Setzer, Edman Circle, PA-C  latanoprost (XALATAN) 0.005 % ophthalmic solution Place 1 drop into the left eye at bedtime. 06/29/22  Yes [provider]  moxifloxacin (VIGAMOX) 0.5 % ophthalmic solution Place 1 drop into the right eye 4 (four) times daily.   Yes [provider]  neomycin-polymyxin b-dexamethasone (MAXITROL) 3.5-10000-0.1 OINT Place 1  Application into the right eye at bedtime.   Yes [provider]  nystatin (MYCOSTATIN/NYSTOP) powder Apply 1 application. topically 3 (three) times daily. 03/30/22  Yes [name], MD  ondansetron (ZOFRAN) 4 MG tablet Take 1 tablet (4 mg total) by mouth every 8 (eight) hours as needed for nausea or vomiting. 02/17/22  Yes Lovorn, Jinny Blossom, MD  oxyCODONE-acetaminophen (PERCOCET) 7.5-325 MG tablet Take 1 tablet by mouth every 6 (six) hours as needed for severe pain. 04/27/22  Yes [name], MD  prednisoLONE acetate (PRED FORTE) 1 % ophthalmic suspension Place 1 drop into both eyes 4 (four) times daily. Patient taking differently: Place 1 drop into the right eye 4 (four) times daily. 06/04/22  Yes Setzer, Edman Circle, PA-C  SYMBICORT 160-4.5 MCG/ACT inhaler inhale 2 PUFFS into THE lungs 2 TIMES DAILY Patient taking differently: 2 puffs every 6 (six) hours. Shortness of breath 01/04/22  Yes Hensel, Jamal Collin, MD  Tiotropium Bromide Monohydrate (SPIRIVA RESPIMAT) 1.25 MCG/ACT AERS Inhale 2 puffs into the lungs daily. 11/14/20  Yes Martyn Ehrich, NP  torsemide 40 MG TABS Take 40 mg by mouth 2 (two) times daily. 06/04/22  Yes Setzer, Edman Circle, PA-C  traZODone (DESYREL) 50 MG tablet Take 0.5-1 tablets (25-50 mg total) by mouth at bedtime as needed for sleep. 06/04/22  Yes Setzer, Edman Circle, PA-C  triamcinolone ointment (KENALOG) 0.5 % Apply 1 Application topically 2 (two) times daily. 06/25/22  Yes Ganta, Anupa, DO  Accu-Chek FastClix Lancets MISC USE DAILY AS INSTRUCTED 05/18/21   [name], MD  ACCU-CHEK GUIDE test strip USE T0 CHECK BLOOD SUGAR 3 TIMES DAILY 11/05/21   [name], MD  Accu-Chek Softclix Lancets lancets Use to check blood glucose four times daily 04/17/20   Martyn Malay, MD  Continuous Blood Gluc Sensor (DEXCOM G6 SENSOR) MISC Inject 1 applicator into the skin as directed. Change sensor every 10 days. 11/05/21   [name], MD  Continuous  Blood Gluc Transmit (DEXCOM G6 TRANSMITTER) MISC Inject 1 Device into the skin as directed. Reuse 8 times with sensor changes. 09/03/21   [name], MD  Glucagon (BAQSIMI ONE PACK) 3 MG/DOSE POWD Place 3 mg into the nose as needed (low blood sugar).    [provider]  Glucagon, rDNA, (GLUCAGON EMERGENCY) 1 MG KIT Inject 1 mg into the muscle as needed for low blood sugar. 11/03/21   [provider]  GNP ULTICARE PEN NEEDLES 32G X 4 MM MISC USE TO inject insulin 2 TIMES DAILY 10/02/21   [name], MD    Prior to Admission:  Medications Prior to Admission  Medication Sig Dispense Refill Last Dose   acetaminophen (TYLENOL) 325 MG tablet Take 1-2 tablets (325-650 mg total) by mouth  every 4 (four) hours as needed for mild pain.   unk   albuterol (VENTOLIN HFA) 108 (90 Base) MCG/ACT inhaler INHALE 1 TO 2 PUFFS BY MOUTH EVERY SIX HOURS AS NEEDED FOR WHEEZING OR SHORTNESS OF BREATH (Patient taking differently: Inhale 1 puff into the lungs every 6 (six) hours as needed for shortness of breath.) 6.7 g 4 unk   amLODipine (NORVASC) 10 MG tablet Take 1 tablet (10 mg total) by mouth daily. 30 tablet 0 07/04/2022   aspirin EC 81 MG tablet Take 1 tablet (81 mg total) by mouth daily. Swallow whole. 90 tablet 3 07/04/2022   atropine 1 % ophthalmic solution Place 1 drop into the right eye daily.   07/04/2022   Brinzolamide-Brimonidine (SIMBRINZA) 1-0.2 % SUSP Place 1 drop into the left eye in the morning, at noon, and at bedtime.   07/04/2022   cetirizine (ZYRTEC) 10 MG tablet Take 1 tablet (10 mg total) by mouth daily as needed for allergies. 30 tablet 3 unk   Cholecalciferol 1000 units tablet Take 1 tablet (1,000 Units total) by mouth daily. 90 tablet 0 07/04/2022   DULoxetine (CYMBALTA) 30 MG capsule Take 1 capsule (30 mg total) by mouth daily. X 1 week; then 60 mg daily- for knee and back/nerve pain (Patient taking differently: Take 60 mg by mouth daily.) 60 capsule 5 07/04/2022    gabapentin (NEURONTIN) 600 MG tablet Take 2 tablets (1,200 mg total) by mouth 2 (two) times daily. 120 tablet 0 07/04/2022   insulin aspart (NOVOLOG FLEXPEN) 100 UNIT/ML FlexPen Inject 20 Units into the skin 3 (three) times daily with meals. 45 mL 3 07/04/2022   insulin degludec (TRESIBA FLEXTOUCH) 200 UNIT/ML FlexTouch Pen Inject 52 Units into the skin 2 (two) times daily. 30 mL 3 07/04/2022   latanoprost (XALATAN) 0.005 % ophthalmic solution Place 1 drop into the left eye at bedtime.   07/04/2022   moxifloxacin (VIGAMOX) 0.5 % ophthalmic solution Place 1 drop into the right eye 4 (four) times daily.   07/04/2022   neomycin-polymyxin b-dexamethasone (MAXITROL) 3.5-10000-0.1 OINT Place 1 Application into the right eye at bedtime.   07/04/2022   nystatin (MYCOSTATIN/NYSTOP) powder Apply 1 application. topically 3 (three) times daily. 30 g 0 07/04/2022   ondansetron (ZOFRAN) 4 MG tablet Take 1 tablet (4 mg total) by mouth every 8 (eight) hours as needed for nausea or vomiting. 60 tablet 1 unk   oxyCODONE-acetaminophen (PERCOCET) 7.5-325 MG tablet Take 1 tablet by mouth every 6 (six) hours as needed for severe pain. 120 tablet 0 07/04/2022   prednisoLONE acetate (PRED FORTE) 1 % ophthalmic suspension Place 1 drop into both eyes 4 (four) times daily. (Patient taking differently: Place 1 drop into the right eye 4 (four) times daily.) 5 mL 0 07/04/2022   SYMBICORT 160-4.5 MCG/ACT inhaler inhale 2 PUFFS into THE lungs 2 TIMES DAILY (Patient taking differently: 2 puffs every 6 (six) hours. Shortness of breath) 10.2 g 4 07/04/2022   Tiotropium Bromide Monohydrate (SPIRIVA RESPIMAT) 1.25 MCG/ACT AERS Inhale 2 puffs into the lungs daily. 1 each 0 07/04/2022   torsemide 40 MG TABS Take 40 mg by mouth 2 (two) times daily. 60 tablet 0 07/04/2022   traZODone (DESYREL) 50 MG tablet Take 0.5-1 tablets (25-50 mg total) by mouth at bedtime as needed for sleep. 30 tablet 0 Past Week   triamcinolone ointment (KENALOG) 0.5 % Apply 1  Application topically 2 (two) times daily. 30 g 1 07/04/2022   Accu-Chek FastClix Lancets MISC USE DAILY  AS INSTRUCTED 102 each 3    ACCU-CHEK GUIDE test strip USE T0 CHECK BLOOD SUGAR 3 TIMES DAILY 100 strip 5    Accu-Chek Softclix Lancets lancets Use to check blood glucose four times daily 100 each 12    Continuous Blood Gluc Sensor (DEXCOM G6 SENSOR) MISC Inject 1 applicator into the skin as directed. Change sensor every 10 days. 3 each 11    Continuous Blood Gluc Transmit (DEXCOM G6 TRANSMITTER) MISC Inject 1 Device into the skin as directed. Reuse 8 times with sensor changes. 1 each 3    Glucagon (BAQSIMI ONE PACK) 3 MG/DOSE POWD Place 3 mg into the nose as needed (low blood sugar).      Glucagon, rDNA, (GLUCAGON EMERGENCY) 1 MG KIT Inject 1 mg into the muscle as needed for low blood sugar.      GNP ULTICARE PEN NEEDLES 32G X 4 MM MISC USE TO inject insulin 2 TIMES DAILY 100 each 2    Reviewed current and reported prior to admission medications  Labs:     Latest Ref Rng & Units 07/08/2022   12:56 AM 07/07/2022    1:29 AM 07/06/2022    6:54 AM  BMP  Glucose 70 - 99 mg/dL 259  220  254   BUN 6 - 20 mg/dL 66  54  49   Creatinine 0.44 - 1.00 mg/dL 5.99  3.93  2.63   Sodium 135 - 145 mmol/L 139  135  135   Potassium 3.5 - 5.1 mmol/L 5.1  4.9  4.8   Chloride 98 - 111 mmol/L 100  98  95   CO2 22 - 32 mmol/L '26  23  28   ' Calcium 8.9 - 10.3 mg/dL 8.4  8.5  8.9     Urinalysis    Component Value Date/Time   COLORURINE YELLOW 05/17/2022 2144   APPEARANCEUR HAZY (A) 05/17/2022 2144   LABSPEC 1.012 05/17/2022 2144   PHURINE 5.0 05/17/2022 2144   GLUCOSEU >=500 (A) 05/17/2022 2144   HGBUR LARGE (A) 05/17/2022 2144   BILIRUBINUR NEGATIVE 05/17/2022 2144   BILIRUBINUR negative 04/06/2022 [place] 05/17/2022 2144   PROTEINUR 100 (A) 05/17/2022 2144   UROBILINOGEN 0.2 04/06/2022 1520   UROBILINOGEN 0.2 08/22/2015 0225   NITRITE NEGATIVE 05/17/2022 2144   LEUKOCYTESUR SMALL  (A) 05/17/2022 2144     ROS: Unable to obtain secondary to intubation and sedation  Physical Exam: Vitals:   07/08/22 1200 07/08/22 1230  BP: (!) 120/46 (!) 106/52  Pulse: 80 80  Resp: 19 18  Temp:    SpO2: 96% 96%     General: adult female in bed intubated, morbidly obese habitus HENT: NCAT Eyes: open and tracks Neck: increased neck circumference Heart: S1S2 no rub Lungs: lung sounds diminished Fio02 30 and PEEP 8 Abdomen: morbidly obese habitus; soft/NT Extremities: left BKA; right transmetatarsal amputation wrapped Skin: no rash on extremities exposed Neuro: sedation currently running; does open and shut her eyes to command GU - foley in place with some urine output  Assessment/Plan:  # AKI  - May be secondary to ATN in the setting of severe sepsis with intubation.  Appears to correlate with hypotension/intubation temporally.  On antibiotics and cannot rule out AIN.  Note could also be post-infectious GN given her story  - No acute indication for dialysis however she may develop a need in the next 24-48 hours if no improvement.  Her mother has consented for dialysis - Would obtain UA and  up/cr ratio to guide  - send complement C3, C4 - Continue supportive care  - pharmacy is following for unasyn dosing - would reduce dosing to daily/every 24 hours - Obtain renal ultrasound  # Pharyngeal abscess - Appreciate ENT - Abx per primary team   # Acute on chronic respiratory failure with hypoxia and hypercapnea  - on mechanical ventilation per primary team   # Chronic diastolic CHF  - off of continuous fluids - defer lasix right now and reassess in am    # HTN  - acceptable  - no levo currently (ordered previously but not used?)  # CKD stage 3 - Note does have CKD prior to admit - baseline Cr mid 1's    Claudia Desanctis 07/08/2022, 2:27 PM

## 2022-07-08 NOTE — Consult Note (Signed)
[place] Nurse Consult Note: Reason for Consult: Consult requested for right foot.  Pt recently had right 1st ray amputation performed by Dr Posey Pronto of the podiatry service on 6/16.  Family member states there is still a staple in place to the incision that was due to be removed at an outpatient appointment this week, which the patient will miss, since she is admitted.  Right anterior foot with healing full thickness post-op wound; dry scabbed area, well approximated, 1X4cm, no open wound, drainage, or fluctuance.  Critical care team; please request a podiatry consult by Dr Posey Pronto if desired since the patient will not be able to attend her post-op appointment.  Bedside nurse states she will share this message with the physician during morning rounds. Plan: Topical treatment orders provided for bedside nurses to perform as follows: Apply xeroform gauze to right anterior foot post-op wound Q day, then cover with foam dressing.  (Change foam dressing Q 3 days or PRN soiling.)  Cover foot with ace wrap.  Please re-consult if further assistance is needed.  Thank-you,  Julien Girt MSN, [place], [place], [place], [place]

## 2022-07-09 ENCOUNTER — Ambulatory Visit: Payer: Self-pay | Admitting: Otolaryngology

## 2022-07-09 ENCOUNTER — Encounter: Payer: Medicaid Other | Admitting: Podiatry

## 2022-07-09 DIAGNOSIS — J9601 Acute respiratory failure with hypoxia: Secondary | ICD-10-CM | POA: Diagnosis not present

## 2022-07-09 DIAGNOSIS — J391 Other abscess of pharynx: Secondary | ICD-10-CM | POA: Diagnosis not present

## 2022-07-09 DIAGNOSIS — Z89431 Acquired absence of right foot: Secondary | ICD-10-CM | POA: Diagnosis not present

## 2022-07-09 DIAGNOSIS — K122 Cellulitis and abscess of mouth: Secondary | ICD-10-CM | POA: Diagnosis not present

## 2022-07-09 DIAGNOSIS — M86671 Other chronic osteomyelitis, right ankle and foot: Secondary | ICD-10-CM

## 2022-07-09 LAB — CBC WITH DIFFERENTIAL/PLATELET
Abs Immature Granulocytes: 0.16 10*3/uL — ABNORMAL HIGH (ref 0.00–0.07)
Basophils Absolute: 0.1 10*3/uL (ref 0.0–0.1)
Basophils Relative: 1 %
Eosinophils Absolute: 0.4 10*3/uL (ref 0.0–0.5)
Eosinophils Relative: 3 %
HCT: 28.9 % — ABNORMAL LOW (ref 36.0–46.0)
Hemoglobin: 9 g/dL — ABNORMAL LOW (ref 12.0–15.0)
Immature Granulocytes: 1 %
Lymphocytes Relative: 20 %
Lymphs Abs: 2.9 10*3/uL (ref 0.7–4.0)
MCH: 29.4 pg (ref 26.0–34.0)
MCHC: 31.1 g/dL (ref 30.0–36.0)
MCV: 94.4 fL (ref 80.0–100.0)
Monocytes Absolute: 1.3 10*3/uL — ABNORMAL HIGH (ref 0.1–1.0)
Monocytes Relative: 9 %
Neutro Abs: 9.8 10*3/uL — ABNORMAL HIGH (ref 1.7–7.7)
Neutrophils Relative %: 66 %
Platelets: 349 10*3/uL (ref 150–400)
RBC: 3.06 MIL/uL — ABNORMAL LOW (ref 3.87–5.11)
RDW: 15.2 % (ref 11.5–15.5)
WBC: 14.6 10*3/uL — ABNORMAL HIGH (ref 4.0–10.5)
nRBC: 0 % (ref 0.0–0.2)

## 2022-07-09 LAB — GLUCOSE, CAPILLARY
Glucose-Capillary: 185 mg/dL — ABNORMAL HIGH (ref 70–99)
Glucose-Capillary: 152 mg/dL — ABNORMAL HIGH (ref 70–99)
Glucose-Capillary: 201 mg/dL — ABNORMAL HIGH (ref 70–99)
Glucose-Capillary: 183 mg/dL — ABNORMAL HIGH (ref 70–99)
Glucose-Capillary: 185 mg/dL — ABNORMAL HIGH (ref 70–99)
Glucose-Capillary: 212 mg/dL — ABNORMAL HIGH (ref 70–99)

## 2022-07-09 LAB — BASIC METABOLIC PANEL
Anion gap: 14 (ref 5–15)
Anion gap: 16 — ABNORMAL HIGH (ref 5–15)
Anion gap: 14 (ref 5–15)
BUN: 69 mg/dL — ABNORMAL HIGH (ref 6–20)
BUN: 77 mg/dL — ABNORMAL HIGH (ref 6–20)
BUN: 77 mg/dL — ABNORMAL HIGH (ref 6–20)
CO2: 25 mmol/L (ref 22–32)
CO2: 22 mmol/L (ref 22–32)
CO2: 23 mmol/L (ref 22–32)
Calcium: 8.7 mg/dL — ABNORMAL LOW (ref 8.9–10.3)
Calcium: 8.8 mg/dL — ABNORMAL LOW (ref 8.9–10.3)
Calcium: 8.6 mg/dL — ABNORMAL LOW (ref 8.9–10.3)
Chloride: 100 mmol/L (ref 98–111)
Chloride: 100 mmol/L (ref 98–111)
Chloride: 103 mmol/L (ref 98–111)
Creatinine, Ser: 6.09 mg/dL — ABNORMAL HIGH (ref 0.44–1.00)
Creatinine, Ser: 5.8 mg/dL — ABNORMAL HIGH (ref 0.44–1.00)
Creatinine, Ser: 4.95 mg/dL — ABNORMAL HIGH (ref 0.44–1.00)
GFR, Estimated: 8 mL/min — ABNORMAL LOW (ref >60)
GFR, Estimated: 9 mL/min — ABNORMAL LOW (ref >60)
GFR, Estimated: 10 mL/min — ABNORMAL LOW (ref >60)
Glucose, Bld: 207 mg/dL — ABNORMAL HIGH (ref 70–99)
Glucose, Bld: 202 mg/dL — ABNORMAL HIGH (ref 70–99)
Glucose, Bld: 199 mg/dL — ABNORMAL HIGH (ref 70–99)
Potassium: 5.4 mmol/L — ABNORMAL HIGH (ref 3.5–5.1)
Potassium: 5.1 mmol/L (ref 3.5–5.1)
Potassium: 5.2 mmol/L — ABNORMAL HIGH (ref 3.5–5.1)
Sodium: 139 mmol/L (ref 135–145)
Sodium: 138 mmol/L (ref 135–145)
Sodium: 140 mmol/L (ref 135–145)

## 2022-07-09 LAB — C4 COMPLEMENT: Complement C4, Body Fluid: 59 mg/dL — ABNORMAL HIGH (ref 12–38)

## 2022-07-09 LAB — PHOSPHORUS: Phosphorus: 9.2 mg/dL — ABNORMAL HIGH (ref 2.5–4.6)

## 2022-07-09 LAB — C3 COMPLEMENT: C3 Complement: 200 mg/dL — ABNORMAL HIGH (ref 82–167)

## 2022-07-09 LAB — TRIGLYCERIDES: Triglycerides: 225 mg/dL — ABNORMAL HIGH (ref <150)

## 2022-07-09 MED ORDER — BISACODYL 10 MG RE SUPP
10.0000 mg | Freq: Once | RECTAL | Status: AC
  Administered 2022-07-09: 10 mg via RECTAL
  Filled 2022-07-09: qty 1

## 2022-07-09 MED ORDER — FENTANYL CITRATE (PF) 250 MCG/5ML IJ SOLN
INTRAMUSCULAR | Status: AC
  Filled 2022-07-09: qty 5

## 2022-07-09 MED ORDER — SODIUM ZIRCONIUM CYCLOSILICATE 10 G PO PACK
10.0000 g | PACK | Freq: Once | ORAL | Status: AC
  Administered 2022-07-09: 10 g
  Filled 2022-07-09: qty 1

## 2022-07-09 MED ORDER — PROPOFOL 10 MG/ML IV BOLUS
INTRAVENOUS | Status: AC
  Filled 2022-07-09: qty 20

## 2022-07-09 MED ORDER — MIDAZOLAM HCL 2 MG/2ML IJ SOLN
INTRAMUSCULAR | Status: AC
  Filled 2022-07-09: qty 2

## 2022-07-09 NOTE — TOC Progression Note (Signed)
Transition of Care [facility]) - Progression Note    Patient Details  Name: [name] MRN: [id number] Date of Birth: 07/14/1976  Transition of Care [facility]) CM/SW Contact  Tom-Johnson, Renea Ee, RN Phone Number: 07/09/2022, 2:38 PM  Clinical Narrative:     Patient continues to be intubated. Plan for extubation in OR today with ENT/anesthesia incase patient does not tolerate and requires emergent tracheostomy. Nephrology consulted for worsening kidney function. On IV abx, Precedex, Fentanyl and Levophed. CM will continue to follow with needs as patient progresses with care.         Expected Discharge Plan and Services                                                 Social Determinants of Health (SDOH) Interventions    Readmission Risk Interventions    05/24/2022   11:33 AM  Readmission Risk Prevention Plan  Transportation Screening Complete  Medication Review ([place]) Complete  PCP or Specialist appointment within 3-5 days of discharge Complete  HRI or [place] Complete  SW Recovery Care/Counseling Consult Complete  [place] Patient Refused

## 2022-07-09 NOTE — Progress Notes (Signed)
ENT follow-up.  No new problems.  Ready for trial of weaning and extubation.  Examination unchanged.  Recommend extubation in the operating room, with reintubation if necessary and tracheostomy in that situation.  The patient is not interested in tracheostomy at this point.  If she declines then we will have to reintubate and then reevaluate in a few days.  We have the schedule for today at 4 PM.

## 2022-07-09 NOTE — Progress Notes (Signed)
[place] Progress Note Patient Name: [name] DOB: October 11, 1976 MRN: [id number]   Date of Service  07/09/2022  HPI/Events of Note  Hyperkalemia - K+ = 5.4 and Creatinine = 6.09. BMPs ordered for 5 AM and 5 PM.  eICU Interventions  Plan: Lokelma 10 gm per tube now.       Intervention Category Major Interventions: Electrolyte abnormality - evaluation and management  Arwen Haseley [place] 07/09/2022, 2:16 AM

## 2022-07-09 NOTE — Progress Notes (Signed)
NAME:  [name], MRN:  [id number], DOB:  10/19/1976, LOS: 4 ADMISSION DATE:  07/05/2022 CONSULTATION DATE:  07/05/2022 REFERRING MD:  Jeani Hawking - FMTS CHIEF COMPLAINT:  Ludwig's angina, compromised airway   History of Present Illness:  46 year old woman who presented to [facility] ED 7/31 as a transfer from [facility] ED for further evaluation of progressively worsening throat pain and dysphagia. 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 PMHx significant for HTN, HLD, acute-on-chronic CHF, CAD, COPD/asthma, OSA on CPAP, T2DM (c/b diabetic peripheral neuropathy and bilateral BKAs), GERD, bipolar disorder.  Patient initially contacted family medicine after hours phone line regarding neck soreness and "mucus" in the back of her throat causing difficulty breathing.  Also reported dysphagia with inability to take PO.  At the time of the phone call denied any blood or associated cough with throat discomfort.  Patient presented to [facility] ED a few hours later via EMS with complaints of progressively worsening throat discomfort and dysphagia since eye surgery at [facility] 7/28.  At the time of presentation, patient was noted to have increasing fullness of the area below the angle of the mandible with associated redness and severe pain with concern for deep space infection of the neck, possibly Ludwig's angina vs. tracheal perforation.  Patient then developed trismus with concern for airway compromise.  ENT was called for evaluation.  Empiric cefepime/vanc were initiated.  Patient was transferred from [facility] ED to [facility] ED for further care and fiberoptic nasopharyngoscopy with ENT.  CT Neck 7/31 demonstrated edema and inflammatory stranding around the left submandibular gland, consistent with acute left submandibular sialoadenitis.  Edema and inflammatory stranding were also noted to extend into the ventral upper neck with foci of gas within the left submandibular gland, possibly secondary to infection with gas-producing organism and  associated pharyngitis.  While awaiting floor bed, patient decompensated from an airway protection standpoint, became stridorous and PCCM was consulted for admission.  On arrival to evaluate patient, anesthesia was preparing to intubate. Intubation was successful on second attempt.  Dr. Constance Holster with ENT was present at bedside during intubation, noting excessive purulent secretions but no significant swelling of the supraglottic larynx recommendation for conservative management with IV antibiotics.  PCCM admitted patient for further care.  Pertinent Medical History:   Past Medical History:  Diagnosis Date   Abscess of groin, left    Acute on chronic diastolic (congestive) heart failure ([place]) 02/05/2022   Acute on chronic respiratory failure with hypoxia ([name]) 04/11/2022   Acute osteomyelitis of ankle or foot, left ([place]) 01/31/2018   Acute respiratory failure with hypoxia and hypercarbia ([place]) 06/15/2020   Alveolar hypoventilation    Amputation stump infection ([place]) 04/09/2018   Anemia    not on iron pill   Asthma    Bipolar 2 disorder ([name])    Candidiasis of vagina 05/15/2022   Carpal tunnel syndrome on right    recurrent   Cellulitis 08/2010-08/2011   Chest pain with low risk for cardiac etiology 05/11/2021   Chronic pain    COPD (chronic obstructive pulmonary disease) ([name])    Symbicort daily and Proventil as needed   Costochondritis    De Quervain's tenosynovitis, bilateral 11/01/2015   Depression    Diabetes mellitus type II, uncontrolled 2000   Type 2, Uncontrolled.Takes Lantus daily.Fasting blood sugar runs 150   Diabetic ulcer of right foot ([place]) 12/19/2021   Drug-seeking behavior    Elevated troponin    Exposure to mold 04/21/2020   GERD (gastroesophageal reflux disease)  takes Pantoprazole and Zantac daily   HLD (hyperlipidemia)    takes Atorvastatin daily   Hypertension    takes Lisinopril and Coreg daily   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[place]) 04/11/2019   Left shoulder pain 06/23/2019    Morbid obesity ([place])    Morbid obesity with BMI of 60.0-69.9, adult ([place]) 04/15/2021   Nocturia    Nonobstructive atherosclerosis of coronary artery 11/26/2020   11/26/20 Coronary angiogram: 1.  Mild diffuse proximal LAD stenosis with no evidence of obstructive disease (20 to 30% diffuse stenosis);  2.  Left dominant circumflex with widely patent obtuse marginal branches and left PDA branch, no significant stenoses present  3.  Nondominant RCA with mild diffuse nonobstructive stenosis    OSA on CPAP    Peripheral neuropathy    takes Gabapentin daily   Pneumonia    "walking" several yrs ago and as a baby (12/05/2018)   Pneumonia due to COVID-19 virus 04/14/2021   Primary osteoarthritis of first carpometacarpal joint of left hand 07/30/2016   Rectal fissure    Restless leg    Right carpal tunnel syndrome 09/01/2011   SVT (supraventricular tachycardia) ([place])    Syncope 02/25/2016   Thrombocytosis 04/11/2022   Urinary frequency    Urinary incontinence 10/23/2020   UTI (urinary tract infection) 04/15/2021   Varicose veins    Right medial thigh and Left leg    Significant Hospital Events: Including procedures, antibiotic start and stop dates in addition to other pertinent events   7/28 - Underwent eye surgery at [place] 7/31 Presented to [facility] ED via EMS for progressively worsening throat pain and associated dysphagia, ENT consulted and recommended transfer to [facility] ED for nasopharyngeal scope.While awaiting bed became stridorous with concern for airway compromise and anesthesia called to intubate. Intubated in ED. PCCM consulted for admission and escalation of care. 8/1 - transitioned abx to Unasyn, MAP 50s > transitioned to Precedex and Fentanyl with Levophed if needed, development of AKI 8/2 - febrile to 102.1, start tube feeds 8/3 - nephrology consulted for worsening kidney function  Interim History / Subjective:  NAEON. Following commands on vent. Hopeful for extubation in OR  possibly today with ENT/anesthesia.  Objective:  Blood pressure (!) 111/51, pulse 82, temperature 98 F (36.7 C), resp. rate 14, height 5' (1.524 m), weight (!) 156.8 kg, last menstrual period 07/05/2022, SpO2 97 %.    Vent Mode: PCV FiO2 (%):  [30 %] 30 % Set Rate:  [18 bmp] 18 bmp PEEP:  [8 cmH20] 8 cmH20 Plateau Pressure:  [23 cmH20-24 cmH20] 24 cmH20   Intake/Output Summary (Last 24 hours) at 07/09/2022 0957 Last data filed at 07/09/2022 0900 Gross per 24 hour  Intake 2949.27 ml  Output 1275 ml  Net 1674.27 ml    Filed Weights   07/07/22 0416 07/08/22 0457 07/09/22 0500  Weight: (!) 154.2 kg (!) 155.8 kg (!) 156.8 kg   Physical Examination: General: Adult female, resting in bed, in NAD. Neuro: Sedated but opens eyes and follows basic commands. HEENT: [place]/AT. Sclerae anicteric. ETT in place. Cardiovascular: RRR, no M/R/G.  Lungs: Respirations even and unlabored.  CTA bilaterally, No W/R/R. Abdomen: Morbidly obese. BS x 4, soft, NT/ND.  Musculoskeletal: R transmetatarsal amputation, L BKA.  Skin: Intact, warm, no rashes.  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 - ENT following, appreciate the assistance. Continue abx for now. - Continue Unasyn 3g IV q6h - OK for extubation from  out standpoint, Dr. Lamonte Sakai discussed with Dr. Constance Holster 8/3 and plan is for extubation in OR incase she does not tolerate and requires emergent tracheostomy.  Acute hypoxemic respiratory failure in the setting of deep soft tissue throat infection as above. Hx COPD, OSA on CPAP. - Wean today for hopeful extubation in OR with ENT/anesthesia. - Continue BD's, bronchial hygiene. - Start home nocturnal CPAP once extubated.  Acute on chronic diastolic heart failure Hx CAD, HTN, HLD. - Monitor strict I&Os - Continue ASA - Hold home amlodipine for now.  AKI on CKD3a - ? ATN vs AIN. Given abrupt jump in UOP an stable SCr, hopeful for diuretic phase of  ATN - Nephrology following, appreciate the assistance - Follow BMP. - Hopeful to avoid dialysis (of note, pt is against long term dialysis per reports).  Type 2 diabetes mellitus Poorly controlled in the 200s-300s. Home regimen includes degludec 52u BID, Novolog 20u TID - SSI + Levemir.  Diabetic peripheral neuropathy. - Hold home gabapentin, Cymbalta  At risk for malnutrition. - Nutrition following, appreciate the assistance. - Continue tube feeds, consider change given uncontrolled CBGs.  Constipation. Without bowel movements for last couple days.  - Senna BID, Miralax 17g BID. - Add suppository.  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 - F/u as outpatient 2 weeks after discharge (Dr. Posey Pronto at [facility]).  Bipolar 2 disorder. - Supportive care.  Best Practice: (right click and "Reselect all SmartList Selections" daily)   Diet/type: tubefeeds DVT prophylaxis: prophylactic heparin  GI prophylaxis: PPI Lines: N/A Foley:  Yes, and it is still needed Code Status:  full code Last date of multidisciplinary goals of care discussion [Pending]  Critical care time: 40 minutes   Montey Hora, PA - [facility] For pager details, please see AMION or use Epic chat  After 1900, please call [place] for cross coverage needs 07/09/2022, 10:22 AM

## 2022-07-09 NOTE — Anesthesia Preprocedure Evaluation (Signed)
Anesthesia Evaluation  Patient identified by MRN, date of birth, ID band Patient awake    Reviewed: Allergy & Precautions, NPO status , Patient's Chart, lab work & pertinent test results  Airway Mallampati: Intubated       Dental  (+) Teeth Intact   Pulmonary asthma , sleep apnea , COPD,  COPD inhaler, former smoker,     + decreased breath sounds      Cardiovascular hypertension, Pt. on medications + CAD, + Peripheral Vascular Disease and +CHF   Rhythm:Regular Rate:Normal     Neuro/Psych PSYCHIATRIC DISORDERS Depression Bipolar Disorder  Neuromuscular disease    GI/Hepatic Neg liver ROS, GERD  ,  Endo/Other  diabetes, Type 2, Insulin Dependent  Renal/GU      Musculoskeletal  (+) Arthritis ,   Abdominal (+) + obese,   Peds  Hematology   Anesthesia Other Findings   Reproductive/Obstetrics                            Anesthesia Physical Anesthesia Plan  ASA: 4  Anesthesia Plan: MAC   Post-op Pain Management:    Induction:   PONV Risk Score and Plan: 0  Airway Management Planned: Simple Face Mask  Additional Equipment: None  Intra-op Plan:   Post-operative Plan: Extubation in OR  Informed Consent: I have reviewed the patients History and Physical, chart, labs and discussed the procedure including the risks, benefits and alternatives for the proposed anesthesia with the patient or authorized representative who has indicated his/her understanding and acceptance.       Plan Discussed with: CRNA  Anesthesia Plan Comments: (Plan to extubate in OR. )       Anesthesia Quick Evaluation

## 2022-07-09 NOTE — Consult Note (Signed)
  Subjective:  Patient ID: [name], female    DOB: 04-Sep-1976,  MRN: [id number]  A 46 y.o. female dCHF, CKD, bipolar disorder, COPD, type 2 DM, HTN, morbid obesity and OSA currently intubated for throat pain and swelling.  Limited responses due to intubation.  She is known to me for undergoing revisional partial first ray amputation to the right side now that the stitches are ready to come out. Objective:   Vitals:   07/09/22 0750 07/09/22 0752  BP:    Pulse:    Resp:    Temp:    SpO2: 97% 98%   General AA&O x3. Normal mood and affect.  Vascular Dorsalis pedis and posterior tibial pulses 2/4 bilat. Brisk capillary refill to all digits. Pedal hair present.  Neurologic Epicritic sensation grossly intact.  Dermatologic Skin has completely epithelialized no signs of Deis is noted no complication noted no redness noted.  Sutures are intact.  Orthopedic: MMT 5/5 in dorsiflexion, plantarflexion, inversion, and eversion. Normal joint ROM without pain or crepitus.    Assessment & Plan:  Patient was evaluated and treated and all questions answered.  Right revisional partial first ray amputation with primary closed -All questions and concerns were discussed with the patient in extensive detail -Stitches were removed.  No signs of dehiscence or complication noted. -She will follow-up in an outpatient setting in 2 weeks after discharge -No further surgical plans indicated -Weightbearing as tolerated with surgical shoe -Podiatry to sign off  [name], DPM  Accessible via secure chat for questions or concerns.

## 2022-07-09 NOTE — Progress Notes (Signed)
[facility] Progress Note   46 y.o. female dCHF, CKD, bipolar disorder, COPD, type 2 DM, HTN, morbid obesity and OSA p/w 7/31 with throat pain and swelling.  Difficult intubation after arrival to [facility]. Recently hospitalized for eye surgery requiring intubation per charting and ENT was concerned for possible trauma from same resulting in infection.  Ultimately found to have pharyngeal abscess and has had severe sepsis.  Nephrology is consulted for assitance with management of AKI.  Creatinine trends as below; on admission Cr was 1.15 to rose to 3.93 by 8/2 and ultimately 5.99 on 8/3, the date of consult.  Mother's # [phone]. The patient has previously expressed that she would never want long-term dialysis but would accept dialysis for a short-term event such as AKI.  She does consent to proceed with dialysis if needed.  She has had an AKI event in 10/2021 and per that note had AKI almost requiring dialysis a few years prior to that.     Assessment/ Plan:   #1) AKI  - May be secondary to ATN in the setting of severe sepsis with intubation.  Appears to correlate with hypotension/intubation temporally.  On antibiotics and cannot rule out AIN.  Note could also be post-infectious GN given her story  - No acute indication for dialysis however she may still develop a need in the next 24-48 hours if no improvement.  Her mother has consented for dialysis - Blood in urine but may be from trauma and up/cr 1.7 (may be from DM or secondary FSGS or other GN); no obstr on u/s. - send complement C3, C4 (still waiting) - Continue supportive care  - pharmacy is following for unasyn dosing - dose daily/every 24 hours - UOP has really picked up since consultation and cr stabilized; hopefully this is the start of the diuretic phase of ATN with a subsequent trend in the right direction soon.   2)  Pharyngeal abscess - Appreciate ENT - Abx per primary team    3)  Acute on chronic respiratory  failure with hypoxia and hypercapnea  - on mechanical ventilation per primary team    4)  Chronic diastolic CHF  - off of continuous fluids - defer lasix right now and reassess in am     5)  HTN  - acceptable  - no levo currently (ordered previously but not used?)   6) CKD stage 3 - Note does have CKD prior to admit - baseline Cr mid 1's   Subjective:   UOP started picking up overnight with 1220 ml out even with the large output via NG.   Objective:   BP (!) 123/51   Pulse 76   Temp 98 F (36.7 C)   Resp 17   Ht 5' (1.524 m)   Wt (!) 156.8 kg   LMP 07/05/2022 (Approximate)   SpO2 98%   BMI 67.51 kg/m   Intake/Output Summary (Last 24 hours) at 07/09/2022 0746 Last data filed at 07/09/2022 0700 Gross per 24 hour  Intake 3012.62 ml  Output 1220 ml  Net 1792.62 ml   Weight change: 1 kg  Physical Exam: General: adult female in bed intubated, morbidly obese habitus HENT: NCAT Eyes: open and tracks Neck: increased neck circumference Heart: S1S2 no rub Lungs: lung sounds diminished Fio02 30 and PEEP 8 Abdomen: morbidly obese habitus; soft/NT Extremities: left AKA; right transmetatarsal amputation closed Skin: no rash on extremities exposed Neuro: sedation currently running; does open and shut her eyes to command GU -  foley in place with urine output  Imaging: US RENAL  Result Date: 07/08/2022 CLINICAL DATA:  [id number] acute kidney injury EXAM: RENAL / URINARY TRACT ULTRASOUND COMPLETE COMPARISON:  10/18/2021 FINDINGS: Study is limited due to large body habitus per the technologist's note. Right Kidney: Renal measurements: 12.4 x 6.3 x 7.8 cm = volume: 318.8 mL. Echogenicity within normal limits. No mass or hydronephrosis visualized. Left Kidney: Renal measurements: 13.2 x 6.7 x 7.8 cm = volume: 357.5 mL. Echogenicity within normal limits. No mass or hydronephrosis visualized. Bladder: Not seen Other: None. IMPRESSION: Study is limited due to large body habitus per the  technologist's note. Unremarkable ultrasound imaging of the kidneys. There has been no significant interval change. Electronically Signed   By: Frazier Richards M.D.   On: 07/08/2022 16:03   CT SOFT TISSUE NECK WO CONTRAST  Result Date: 07/08/2022 CLINICAL DATA:  Soft tissue swelling with infection suspected. EXAM: CT NECK WITHOUT CONTRAST TECHNIQUE: Multidetector CT imaging of the neck was performed following the standard protocol without intravenous contrast. RADIATION DOSE REDUCTION: This exam was performed according to the departmental dose-optimization program which includes automated exposure control, adjustment of the mA and/or kV according to patient size and/or use of iterative reconstruction technique. COMPARISON:  07/05/2022 FINDINGS: Pharynx and larynx: Edematous appearance of the epiglottis and left pharyngeal wall. Continued inflammation centered in the left submandibular space with clustered gas bubbles measuring up to 2.2 cm craniocaudal. Subcutaneous edema in the chin has slightly improved. Located endotracheal and enteric tubes where covered. No visible fluid collection. Salivary glands: Inflammation around the left submandibular gland. 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 No acute finding. Mastoids and visualized paranasal sinuses: Clear Skeleton: No acute or aggressive finding Upper chest: Clear apical lungs. IMPRESSION: Unchanged supraglottitis and left submandibular cellulitis with soft tissue gas in the left submandibular space. Electronically Signed   By: Jorje Guild M.D.   On: 07/08/2022 05:01    Labs: BMET Recent Labs  Lab 07/05/22 1944 07/06/22 0518 07/06/22 0654 07/07/22 0129 07/08/22 0056 07/08/22 1701 07/09/22 0034 07/09/22 0455  NA 134* 135 135 135 139 140 139 138  K 4.7 4.5 4.8 4.9 5.1 4.8 5.4* 5.1  CL 98  --  95* 98 100 98  100 100  CO2 26  --  '28 23 26 25 25 22  '$ GLUCOSE 322*  --  254* 220* 259* 143* 207* 202*  BUN 44*  --  49* 54* 66* 73* 69* 77*  CREATININE 1.53*  --  2.63* 3.93* 5.99* 6.15* 6.09* 5.80*  CALCIUM 8.5*  --  8.9 8.5* 8.4* 8.7* 8.7* 8.8*  PHOS 4.5  --  7.0*  --   --   --  9.2*  --    CBC Recent Labs  Lab 07/05/22 1944 07/06/22 0518 07/06/22 0654 07/07/22 0129 07/08/22 0056 07/09/22 0034  WBC 16.0*  --  15.2* 15.1* 14.2* 14.6*  NEUTROABS 11.8*  --   --  10.4* 9.3* 9.8*  HGB 10.4*   < > 10.6* 10.3* 9.1* 9.0*  HCT 32.7*   < > 34.8* 32.9* 29.0* 28.9*  MCV 92.4  --  96.4 94.8 94.8 94.4  PLT 350  --  351 363 332 349   < > = values in this interval not displayed.    Medications:     arformoterol  15 mcg Nebulization BID   atropine  1 drop Right Eye Daily  brinzolamide  1 drop Left Eye 3 times per day   And   brimonidine  1 drop Left Eye 3 times per day   Chlorhexidine Gluconate Cloth  6 each Topical Daily   docusate  100 mg Per Tube BID   gatifloxacin  1 drop Right Eye QID   heparin  5,000 Units Subcutaneous Q8H   insulin aspart  1-3 Units Subcutaneous Q4H   insulin aspart  9 Units Subcutaneous Q4H   insulin detemir  26 Units Subcutaneous Q12H   ipratropium-albuterol  3 mL Nebulization BID   latanoprost  1 drop Left Eye QHS   multivitamin with minerals  1 tablet Per Tube Daily   neomycin-polymyxin b-dexamethasone  1 Application Right Eye QHS   mouth rinse  15 mL Mouth Rinse Q2H   pantoprazole sodium  40 mg Per Tube Daily   polyethylene glycol  17 g Per Tube BID   prednisoLONE acetate  1 drop Right Eye QID   sennosides  5 mL Per Tube BID      Otelia Santee, MD 07/09/2022, 7:46 AM

## 2022-07-09 NOTE — H&P (View-Only) (Signed)
ENT follow-up.  No new problems.  Ready for trial of weaning and extubation.  Examination unchanged.  Recommend extubation in the operating room, with reintubation if necessary and tracheostomy in that situation.  The patient is not interested in tracheostomy at this point.  If she declines then we will have to reintubate and then reevaluate in a few days.  We have the schedule for today at 4 PM.

## 2022-07-10 ENCOUNTER — Encounter (HOSPITAL_COMMUNITY)
Admission: EM | Discharge status: Against Medical Advice | Payer: Self-pay | Source: Home / Self Care | Attending: Internal Medicine

## 2022-07-10 ENCOUNTER — Inpatient Hospital Stay (HOSPITAL_COMMUNITY): Payer: Medicaid Other | Admitting: Certified Registered Nurse Anesthetist

## 2022-07-10 ENCOUNTER — Inpatient Hospital Stay (HOSPITAL_COMMUNITY): Payer: Medicaid Other

## 2022-07-10 DIAGNOSIS — J449 Chronic obstructive pulmonary disease, unspecified: Secondary | ICD-10-CM | POA: Diagnosis not present

## 2022-07-10 DIAGNOSIS — G4730 Sleep apnea, unspecified: Secondary | ICD-10-CM

## 2022-07-10 DIAGNOSIS — J9601 Acute respiratory failure with hypoxia: Secondary | ICD-10-CM | POA: Diagnosis not present

## 2022-07-10 DIAGNOSIS — J9621 Acute and chronic respiratory failure with hypoxia: Secondary | ICD-10-CM | POA: Diagnosis not present

## 2022-07-10 DIAGNOSIS — K122 Cellulitis and abscess of mouth: Secondary | ICD-10-CM | POA: Diagnosis not present

## 2022-07-10 DIAGNOSIS — J391 Other abscess of pharynx: Secondary | ICD-10-CM | POA: Diagnosis not present

## 2022-07-10 DIAGNOSIS — Z87891 Personal history of nicotine dependence: Secondary | ICD-10-CM | POA: Diagnosis not present

## 2022-07-10 DIAGNOSIS — L089 Local infection of the skin and subcutaneous tissue, unspecified: Secondary | ICD-10-CM | POA: Diagnosis not present

## 2022-07-10 HISTORY — PX: TRACHEOSTOMY TUBE PLACEMENT: SHX814

## 2022-07-10 LAB — CULTURE, BLOOD (ROUTINE X 2)
Culture: NO GROWTH
Culture: NO GROWTH
Special Requests: ADEQUATE
Special Requests: ADEQUATE

## 2022-07-10 LAB — GLUCOSE, CAPILLARY
Glucose-Capillary: 215 mg/dL — ABNORMAL HIGH (ref 70–99)
Glucose-Capillary: 248 mg/dL — ABNORMAL HIGH (ref 70–99)
Glucose-Capillary: 185 mg/dL — ABNORMAL HIGH (ref 70–99)
Glucose-Capillary: 179 mg/dL — ABNORMAL HIGH (ref 70–99)
Glucose-Capillary: 156 mg/dL — ABNORMAL HIGH (ref 70–99)
Glucose-Capillary: 121 mg/dL — ABNORMAL HIGH (ref 70–99)

## 2022-07-10 LAB — COMPREHENSIVE METABOLIC PANEL
ALT: 19 U/L (ref 0–44)
AST: 16 U/L (ref 15–41)
Albumin: 2.3 g/dL — ABNORMAL LOW (ref 3.5–5.0)
Alkaline Phosphatase: 193 U/L — ABNORMAL HIGH (ref 38–126)
Anion gap: 10 (ref 5–15)
BUN: 66 mg/dL — ABNORMAL HIGH (ref 6–20)
CO2: 26 mmol/L (ref 22–32)
Calcium: 8.9 mg/dL (ref 8.9–10.3)
Chloride: 109 mmol/L (ref 98–111)
Creatinine, Ser: 3.69 mg/dL — ABNORMAL HIGH (ref 0.44–1.00)
GFR, Estimated: 15 mL/min — ABNORMAL LOW (ref >60)
Glucose, Bld: 210 mg/dL — ABNORMAL HIGH (ref 70–99)
Potassium: 4.5 mmol/L (ref 3.5–5.1)
Sodium: 145 mmol/L (ref 135–145)
Total Bilirubin: 0.5 mg/dL (ref 0.3–1.2)
Total Protein: 8.8 g/dL — ABNORMAL HIGH (ref 6.5–8.1)

## 2022-07-10 LAB — BASIC METABOLIC PANEL
Anion gap: 13 (ref 5–15)
Anion gap: 10 (ref 5–15)
BUN: 73 mg/dL — ABNORMAL HIGH (ref 6–20)
BUN: 60 mg/dL — ABNORMAL HIGH (ref 6–20)
CO2: 26 mmol/L (ref 22–32)
CO2: 26 mmol/L (ref 22–32)
Calcium: 8.8 mg/dL — ABNORMAL LOW (ref 8.9–10.3)
Calcium: 9.2 mg/dL (ref 8.9–10.3)
Chloride: 103 mmol/L (ref 98–111)
Chloride: 108 mmol/L (ref 98–111)
Creatinine, Ser: 4.11 mg/dL — ABNORMAL HIGH (ref 0.44–1.00)
Creatinine, Ser: 3.08 mg/dL — ABNORMAL HIGH (ref 0.44–1.00)
GFR, Estimated: 13 mL/min — ABNORMAL LOW (ref >60)
GFR, Estimated: 18 mL/min — ABNORMAL LOW (ref >60)
Glucose, Bld: 228 mg/dL — ABNORMAL HIGH (ref 70–99)
Glucose, Bld: 187 mg/dL — ABNORMAL HIGH (ref 70–99)
Potassium: 5 mmol/L (ref 3.5–5.1)
Potassium: 4.3 mmol/L (ref 3.5–5.1)
Sodium: 142 mmol/L (ref 135–145)
Sodium: 144 mmol/L (ref 135–145)

## 2022-07-10 LAB — TROPONIN I (HIGH SENSITIVITY): Troponin I (High Sensitivity): 35 ng/L — ABNORMAL HIGH (ref <18)

## 2022-07-10 LAB — MAGNESIUM: Magnesium: 2.8 mg/dL — ABNORMAL HIGH (ref 1.7–2.4)

## 2022-07-10 LAB — PHOSPHORUS: Phosphorus: 6.1 mg/dL — ABNORMAL HIGH (ref 2.5–4.6)

## 2022-07-10 SURGERY — CREATION, TRACHEOSTOMY
Anesthesia: Monitor Anesthesia Care

## 2022-07-10 MED ORDER — HYDROXYZINE HCL 10 MG/5ML PO SYRP
10.0000 mg | ORAL_SOLUTION | Freq: Three times a day (TID) | ORAL | Status: DC | PRN
  Administered 2022-07-11: 10 mg
  Filled 2022-07-10 (×2): qty 5

## 2022-07-10 MED ORDER — 0.9 % SODIUM CHLORIDE (POUR BTL) OPTIME
TOPICAL | Status: DC | PRN
  Administered 2022-07-10: 1000 mL

## 2022-07-10 MED ORDER — HYDRALAZINE HCL 20 MG/ML IJ SOLN
10.0000 mg | Freq: Four times a day (QID) | INTRAMUSCULAR | Status: DC | PRN
  Administered 2022-07-10: 10 mg via INTRAVENOUS
  Filled 2022-07-10 (×2): qty 1

## 2022-07-10 MED ORDER — GABAPENTIN 250 MG/5ML PO SOLN
600.0000 mg | Freq: Two times a day (BID) | ORAL | Status: DC
  Administered 2022-07-10 – 2022-07-12 (×4): 600 mg
  Filled 2022-07-10 (×8): qty 12

## 2022-07-10 MED ORDER — POLYETHYLENE GLYCOL 3350 17 G PO PACK: 17.0000 g | PACK | Freq: Every day | ORAL | Status: DC

## 2022-07-10 MED ORDER — MELATONIN 5 MG PO TABS
5.0000 mg | ORAL_TABLET | Freq: Every evening | ORAL | Status: DC | PRN
Start: 2022-07-10 — End: 2022-07-14
  Administered 2022-07-10 – 2022-07-12 (×2): 5 mg via ORAL
  Filled 2022-07-10 (×2): qty 1

## 2022-07-10 MED ORDER — ORAL CARE MOUTH RINSE: 15.0000 mL | OROMUCOSAL | Status: DC | PRN

## 2022-07-10 MED ORDER — DIPHENHYDRAMINE HCL 50 MG/ML IJ SOLN
25.0000 mg | Freq: Once | INTRAMUSCULAR | Status: AC
Start: 2022-07-10 — End: 2022-07-10
  Administered 2022-07-10: 25 mg via INTRAVENOUS
  Filled 2022-07-10: qty 1

## 2022-07-10 MED ORDER — DIPHENHYDRAMINE HCL 50 MG/ML IJ SOLN
12.5000 mg | Freq: Once | INTRAMUSCULAR | Status: AC
  Administered 2022-07-10: 12.5 mg via INTRAVENOUS
  Filled 2022-07-10: qty 1

## 2022-07-10 MED ORDER — LIDOCAINE-EPINEPHRINE 1 %-1:100000 IJ SOLN
INTRAMUSCULAR | Status: AC
  Filled 2022-07-10: qty 1

## 2022-07-10 MED ORDER — FENTANYL CITRATE PF 50 MCG/ML IJ SOSY: 50.0000 ug | PREFILLED_SYRINGE | INTRAMUSCULAR | Status: DC | PRN

## 2022-07-10 MED ORDER — PHENOL 1.4 % MT LIQD
1.0000 | OROMUCOSAL | Status: DC | PRN
  Administered 2022-07-10: 1 via OROMUCOSAL
  Filled 2022-07-10: qty 177

## 2022-07-10 MED ORDER — DOCUSATE SODIUM 50 MG/5ML PO LIQD: 100.0000 mg | Freq: Two times a day (BID) | ORAL | Status: DC

## 2022-07-10 MED ORDER — ONDANSETRON HCL 4 MG/2ML IJ SOLN
4.0000 mg | Freq: Four times a day (QID) | INTRAMUSCULAR | Status: DC | PRN
  Administered 2022-07-10 – 2022-07-11 (×3): 4 mg via INTRAVENOUS
  Filled 2022-07-10 (×4): qty 2

## 2022-07-10 MED ORDER — ORAL CARE MOUTH RINSE
15.0000 mL | OROMUCOSAL | Status: DC
  Administered 2022-07-10 – 2022-07-13 (×13): 15 mL via OROMUCOSAL

## 2022-07-10 MED ORDER — BISACODYL 10 MG RE SUPP: 10.0000 mg | Freq: Every day | RECTAL | Status: DC | PRN

## 2022-07-10 MED ORDER — SODIUM CHLORIDE 0.9 % IV SOLN: INTRAVENOUS | Status: DC

## 2022-07-10 MED ORDER — PROPOFOL 10 MG/ML IV BOLUS
INTRAVENOUS | Status: AC
  Filled 2022-07-10: qty 20

## 2022-07-10 MED ORDER — ASPIRIN 81 MG PO CHEW
81.0000 mg | CHEWABLE_TABLET | Freq: Every day | ORAL | Status: DC
  Administered 2022-07-11 – 2022-07-12 (×2): 81 mg
  Filled 2022-07-10 (×2): qty 1

## 2022-07-10 MED ORDER — ENOXAPARIN SODIUM 40 MG/0.4ML IJ SOSY: 40.0000 mg | PREFILLED_SYRINGE | INTRAMUSCULAR | Status: DC

## 2022-07-10 MED ORDER — INSULIN GLARGINE-YFGN 100 UNIT/ML ~~LOC~~ SOLN: 27.0000 [IU] | Freq: Two times a day (BID) | SUBCUTANEOUS | Status: DC

## 2022-07-10 SURGICAL SUPPLY — 20 items
COVER SURGICAL LIGHT HANDLE (MISCELLANEOUS) ×3 IMPLANT
DRAPE HALF SHEET 40X57 (DRAPES) IMPLANT
ELECT COATED BLADE 2.86 ST (ELECTRODE) ×3 IMPLANT
ELECT REM PT RETURN 9FT ADLT (ELECTROSURGICAL) ×2
ELECTRODE REM PT RTRN 9FT ADLT (ELECTROSURGICAL) ×2 IMPLANT
GAUZE 4X4 16PLY ~~LOC~~+RFID DBL (SPONGE) ×3 IMPLANT
GLOVE ECLIPSE 7.5 STRL STRAW (GLOVE) ×3 IMPLANT
GOWN STRL REUS W/ TWL LRG LVL3 (GOWN DISPOSABLE) ×4 IMPLANT
GOWN STRL REUS W/TWL LRG LVL3 (GOWN DISPOSABLE) ×4
KIT BASIN OR (CUSTOM PROCEDURE TRAY) ×2 IMPLANT
KIT TURNOVER KIT B (KITS) ×3 IMPLANT
NDL PRECISIONGLIDE 27X1.5 (NEEDLE) ×2 IMPLANT
NEEDLE PRECISIONGLIDE 27X1.5 (NEEDLE) IMPLANT
NS IRRIG 1000ML POUR BTL (IV SOLUTION) ×3 IMPLANT
PENCIL FOOT CONTROL (ELECTRODE) ×3 IMPLANT
SUT SILK 4 0 TIE 10X30 (SUTURE) ×2 IMPLANT
SYR 20ML LL LF (SYRINGE) ×2 IMPLANT
TOWEL GREEN STERILE FF (TOWEL DISPOSABLE) ×3 IMPLANT
TRAY ENT MC OR (CUSTOM PROCEDURE TRAY) ×3 IMPLANT
TUBE CONNECTING 12X1/4 (SUCTIONS) ×3 IMPLANT

## 2022-07-10 NOTE — Anesthesia Procedure Notes (Signed)
Date/Time: 07/10/2022 8:40 AM  Performed by: Lorie Phenix, CRNAPre-anesthesia Checklist: Patient identified, Emergency Drugs available, Suction available and Patient being monitored Oxygen Delivery Method: Simple face mask

## 2022-07-10 NOTE — Anesthesia Postprocedure Evaluation (Signed)
Anesthesia Post Note  Patient: [name]  Procedure(s) Performed: CONTROLLED EXTUBATION     Patient location during evaluation: ICU Anesthesia Type: MAC Level of consciousness: awake and alert Pain management: pain level controlled Vital Signs Assessment: post-procedure vital signs reviewed and stable Respiratory status: spontaneous breathing, nonlabored ventilation, respiratory function stable and patient connected to nasal cannula oxygen Cardiovascular status: stable and blood pressure returned to baseline Postop Assessment: no apparent nausea or vomiting Anesthetic complications: no   No notable events documented.  Last Vitals:  Vitals:   07/10/22 1200 07/10/22 1230  BP: (!) 175/71 (!) 180/68  Pulse: 96 (!) 102  Resp: 12 12  Temp:    SpO2: 97% 97%    Last Pain:  Vitals:   07/10/22 1200  TempSrc:   PainSc: 0-No pain                 Effie Berkshire

## 2022-07-10 NOTE — Hospital Course (Addendum)
[name] is a 45 year old woman presented with  with submandibular sialoadenitis and pharyngeal abscess requiring intubation and ICU admission. Hospital course complicated by acute kidney injury and dysphagia. 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 CT neck consistent with left submandibular sialoadenitis and evidence of infection caused by gas-producing organism. Started on empiric vancomycin and cefepime. ENT consulted. Later that day, acutely decompensated from airway protection standpoint requiring intubation and ICU admission. Antibiotics transitioned to ampicillin-sulbactam, no clear indication for surgical intervention per ENT. Repeat CT stable without progression. Ultimately was successfully extubated on 8/5 in the OR (due to difficult airway, in case urgent tracheostomy was needed). She had dysphagia following extubation, briefly required NG tube and tube feeds. Transferred to [facility]MTS on 8/7. FEES study performed by SLP, no significant dysphagia so NGT removed and diet resumed. Unfortunately, patient left AMA and only completed 8/10 day planned course of IV antibiotics.   Acute tubular necrosis History of CKD3. Also complicated by hypernatremia. Nephrology was consulted due to rising Cr and possible need for short-term dialysis, felt to be secondary to ATN. Torsemide held. Hypernatremia treated with free water. She started showing signs of recovery with decreasing Cr and increasing UOP, ultimately did not require dialysis. Most recent Cr 1.72.   Diastolic HF (heart failure) Chronic, stable. Euvolemic on exam. CXR 8/7 shows no fluid overload. Diuretics held given AKI.    Type 2 diabetes mellitus with diabetic neuropathy, unspecified Was poorly  controlled initially in the 200s to 300s, basal + bolus insulin regimen was titrated. Gabapentin dose reduced.   Right foot amputation Stitches removed by podiatry 8/4. No sign of infection. Plan for follow-up as outpatient 2 weeks after discharge (Dr. Posey Pronto at [place]).   Bipolar 2 disorder Present on admission. Treatment naive per patient. Consider referral to psychiatry / psychotherapy  Patient left AMA, deemed to have capacity. See chart for further details. Patient was suspected to be using cocaine hours before leaving, UDS had been ordered but not collected.  Issues for follow up:  Check CBC and metabolic panel Torsemide held, restart as needed Optimize insulin regimen F/u with nephrology in 6 weeks F/u podiatry 2 weeks F/u ophthalmology after d/c Consider referral to psychiatry, psychotherapy for mood disorder

## 2022-07-10 NOTE — Progress Notes (Signed)
[facility] Progress Note   46 y.o. female dCHF, CKD, bipolar disorder, COPD, type 2 DM, HTN, morbid obesity and OSA p/w 7/31 with throat pain and swelling.  Difficult intubation after arrival to [facility]. Recently hospitalized for eye surgery requiring intubation per charting and ENT was concerned for possible trauma from same resulting in infection.  Ultimately found to have pharyngeal abscess and has had severe sepsis.  Nephrology is consulted for assitance with management of AKI.  Creatinine trends as below; on admission Cr was 1.15 to rose to 3.93 by 8/2 and ultimately 5.99 on 8/3, the date of consult.  Mother's # [phone]. The patient has previously expressed that she would never want long-term dialysis but would accept dialysis for a short-term event such as AKI.  She does consent to proceed with dialysis if needed.  She has had an AKI event in 10/2021 and per that note had AKI almost requiring dialysis a few years prior to that.     Assessment/ Plan:   #1) AKI  - May be secondary to ATN in the setting of severe sepsis with intubation.  Appears to correlate with hypotension/intubation temporally.  On antibiotics and cannot rule out AIN.  Note could also be post-infectious GN given her story  - No acute indication for dialysis however she may still develop a need in the next 24-48 hours if no improvement.  Her mother has consented for dialysis - Blood in urine but may be from trauma and up/cr 1.7 (may be from DM or secondary FSGS or other GN); no obstr on u/s. - C3, C4 are not low. - Continue supportive care  - pharmacy is following for unasyn dosing - dose daily/every 24 hours - UOP has really picked up since consultation and cr stabilized; appears to be in the recovery phase of ATN.  Signing off at this time; please reconsult as needed. Will need 4-6 week follow up upon d/c; will have office call her with an appt.    2)  Pharyngeal abscess - Appreciate ENT - Abx per  primary team    3)  Acute on chronic respiratory failure with hypoxia and hypercapnea  - on mechanical ventilation per primary team    4)  Chronic diastolic CHF  - off of continuous fluids - defer lasix right now and reassess in am     5)  HTN  - acceptable    6) CKD stage 3 - Note does have CKD prior to admit - baseline Cr mid 1's;  monitor as outpt over the next few months.   Subjective:   UOP continues to be good.  Cough but no fever, chills or SOB.    Objective:   BP (!) 146/64   Pulse 92   Temp 98.8 F (37.1 C) (Esophageal)   Resp (!) 9   Ht 5' (1.524 m)   Wt (!) 157.1 kg   LMP 07/05/2022 (Approximate)   SpO2 91%   BMI 67.64 kg/m   Intake/Output Summary (Last 24 hours) at 07/10/2022 1035 Last data filed at 07/10/2022 0930 Gross per 24 hour  Intake 1673.41 ml  Output 3950 ml  Net -2276.59 ml   Weight change: 0.3 kg  Physical Exam: General: adult female extubated, morbidly obese habitus, pleasant HENT: NCAT Neck: increased neck circumference Heart: S1S2 no rub Lungs: CTA b/l Abdomen: morbidly obese habitus; soft/NT Extremities: left AKA; right transmetatarsal amputation closed Skin: no rash on extremities exposed Neuro: alert and cooperative, appropriate  Imaging: US RENAL  Result Date: 07/08/2022 CLINICAL DATA:  [id number] acute kidney injury EXAM: RENAL / URINARY TRACT ULTRASOUND COMPLETE COMPARISON:  10/18/2021 FINDINGS: Study is limited due to large body habitus per the technologist's note. Right Kidney: Renal measurements: 12.4 x 6.3 x 7.8 cm = volume: 318.8 mL. Echogenicity within normal limits. No mass or hydronephrosis visualized. Left Kidney: Renal measurements: 13.2 x 6.7 x 7.8 cm = volume: 357.5 mL. Echogenicity within normal limits. No mass or hydronephrosis visualized. Bladder: Not seen Other: None. IMPRESSION: Study is limited due to large body habitus per the technologist's note. Unremarkable ultrasound imaging of the kidneys. There has been no  significant interval change. Electronically Signed   By: Frazier Richards M.D.   On: 07/08/2022 16:03    Labs: BMET Recent Labs  Lab 07/05/22 1944 07/06/22 0518 07/06/22 0654 07/07/22 0129 07/08/22 0056 07/08/22 1701 07/09/22 0034 07/09/22 0455 07/09/22 1700 07/10/22 0321  NA 134*   < > 135 135 139 140 139 138 140 142  K 4.7   < > 4.8 4.9 5.1 4.8 5.4* 5.1 5.2* 5.0  CL 98  --  95* 98 100 98 100 100 103 103  CO2 26  --  '28 23 26 25 25 22 23 26  '$ GLUCOSE 322*  --  254* 220* 259* 143* 207* 202* 199* 228*  BUN 44*  --  49* 54* 66* 73* 69* 77* 77* 73*  CREATININE 1.53*  --  2.63* 3.93* 5.99* 6.15* 6.09* 5.80* 4.95* 4.11*  CALCIUM 8.5*  --  8.9 8.5* 8.4* 8.7* 8.7* 8.8* 8.6* 8.8*  PHOS 4.5  --  7.0*  --   --   --  9.2*  --   --  6.1*   < > = values in this interval not displayed.   CBC Recent Labs  Lab 07/05/22 1944 07/06/22 0518 07/06/22 0654 07/07/22 0129 07/08/22 0056 07/09/22 0034  WBC 16.0*  --  15.2* 15.1* 14.2* 14.6*  NEUTROABS 11.8*  --   --  10.4* 9.3* 9.8*  HGB 10.4*   < > 10.6* 10.3* 9.1* 9.0*  HCT 32.7*   < > 34.8* 32.9* 29.0* 28.9*  MCV 92.4  --  96.4 94.8 94.8 94.4  PLT 350  --  351 363 332 349   < > = values in this interval not displayed.    Medications:     arformoterol  15 mcg Nebulization BID   [START ON 07/11/2022] aspirin  81 mg Per Tube Daily   atropine  1 drop Right Eye Daily   brinzolamide  1 drop Left Eye 3 times per day   And   brimonidine  1 drop Left Eye 3 times per day   Chlorhexidine Gluconate Cloth  6 each Topical Daily   docusate  100 mg Per Tube BID   gatifloxacin  1 drop Right Eye QID   heparin  5,000 Units Subcutaneous Q8H   insulin aspart  1-3 Units Subcutaneous Q4H   insulin aspart  9 Units Subcutaneous Q4H   insulin detemir  26 Units Subcutaneous Q12H   ipratropium-albuterol  3 mL Nebulization BID   latanoprost  1 drop Left Eye QHS   multivitamin with minerals  1 tablet Per Tube Daily   neomycin-polymyxin b-dexamethasone  1  Application Right Eye QHS   mouth rinse  15 mL Mouth Rinse 4 times per day   polyethylene glycol  17 g Per Tube BID   prednisoLONE acetate  1 drop Right Eye QID   sennosides  5 mL  Per Tube BID      Otelia Santee, MD 07/10/2022, 10:35 AM

## 2022-07-10 NOTE — Evaluation (Signed)
Clinical/Bedside Swallow Evaluation Patient Details  Name: [name] MRN: [id number] Date of Birth: 1976-10-13  Today's Date: 07/10/2022 Time: SLP Start Time (ACUTE ONLY): 1218 SLP Stop Time (ACUTE ONLY): 1230 SLP Time Calculation (min) (ACUTE ONLY): 12 min  Past Medical History:  Past Medical History:  Diagnosis Date   Abscess of groin, left    Acute on chronic diastolic (congestive) heart failure ([place]) 02/05/2022   Acute on chronic respiratory failure with hypoxia ([place]) 04/11/2022   Acute osteomyelitis of ankle or foot, left ([place]) 01/31/2018   Acute respiratory failure with hypoxia and hypercarbia ([place]) 06/15/2020   Alveolar hypoventilation    Amputation stump infection ([place]) 04/09/2018   Anemia    not on iron pill   Asthma    Bipolar 2 disorder ([name])    Candidiasis of vagina 05/15/2022   Carpal tunnel syndrome on right    recurrent   Cellulitis 08/2010-08/2011   Chest pain with low risk for cardiac etiology 05/11/2021   Chronic pain    COPD (chronic obstructive pulmonary disease) ([name])    Symbicort daily and Proventil as needed   Costochondritis    De Quervain's tenosynovitis, bilateral 11/01/2015   Depression    Diabetes mellitus type II, uncontrolled 2000   Type 2, Uncontrolled.Takes Lantus daily.Fasting blood sugar runs 150   Diabetic ulcer of right foot ([place]) 12/19/2021   Drug-seeking behavior    Elevated troponin    Exposure to mold 04/21/2020   GERD (gastroesophageal reflux disease)    takes Pantoprazole and Zantac daily   HLD (hyperlipidemia)    takes Atorvastatin daily   Hypertension    takes Lisinopril and Coreg daily   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[place]) 04/11/2019   Left shoulder pain 06/23/2019   Morbid obesity ([place])    Morbid obesity with BMI of 60.0-69.9, adult ([place]) 04/15/2021   Nocturia    Nonobstructive atherosclerosis of coronary artery 11/26/2020   11/26/20 Coronary angiogram: 1.  Mild diffuse proximal LAD stenosis with no evidence of obstructive disease (20 to 30%  diffuse stenosis);  2.  Left dominant circumflex with widely patent obtuse marginal branches and left PDA branch, no significant stenoses present  3.  Nondominant RCA with mild diffuse nonobstructive stenosis    OSA on CPAP    Peripheral neuropathy    takes Gabapentin daily   Pneumonia    "walking" several yrs ago and as a baby (12/05/2018)   Pneumonia due to COVID-19 virus 04/14/2021   Primary osteoarthritis of first carpometacarpal joint of left hand 07/30/2016   Rectal fissure    Restless leg    Right carpal tunnel syndrome 09/01/2011   SVT (supraventricular tachycardia) ([place])    Syncope 02/25/2016   Thrombocytosis 04/11/2022   Urinary frequency    Urinary incontinence 10/23/2020   UTI (urinary tract infection) 04/15/2021   Varicose veins    Right medial thigh and Left leg    Past Surgical History:  Past Surgical History:  Procedure Laterality Date   AMPUTATION Left 02/01/2018   Procedure: LEFT FOURTH AND 5TH TOE RAY AMPUTATION;  Surgeon: [name], MD;  Location: [place];  Service: Orthopedics;  Laterality: Left;   AMPUTATION Left 03/03/2018   Procedure: LEFT BELOW KNEE AMPUTATION;  Surgeon: [name], MD;  Location: [place];  Service: Orthopedics;  Laterality: Left;   AMPUTATION Right 05/21/2022   Procedure: REVISION AMPUTATION RAY 1ST;  Surgeon: [name], DPM;  Location: [place];  Service: Podiatry;  Laterality: Right;   CARPAL TUNNEL RELEASE Bilateral  CESAREAN SECTION  2007   CORONARY ANGIOGRAPHY N/A 11/26/2020   Procedure: CORONARY ANGIOGRAPHY;  Surgeon: Sherren Mocha, MD;  Location: [place] [facility];  Service: Cardiovascular;  Laterality: N/A;   FOOT AMPUTATION Right    INCISION AND DRAINAGE ABSCESS Left 10/16/2021   Procedure: INCISION AND DRAINAGE ABSCESS;  Surgeon: Dwan Bolt, MD;  Location: [place];  Service: General;  Laterality: Left;   INCISION AND DRAINAGE PERIRECTAL ABSCESS Left 05/18/2019   Procedure: IRRIGATION AND DEBRIDEMENT OF PANNIS ABSCESS,  POSSIBLE DEBRIDEMENT OF BUTTOCK WOUND;  Surgeon: Donnie Mesa, MD;  Location: [place];  Service: General;  Laterality: Left;   IRRIGATION AND DEBRIDEMENT BUTTOCKS Left 05/17/2019   Procedure: IRRIGATION AND DEBRIDEMENT BUTTOCKS;  Surgeon: Donnie Mesa, MD;  Location: [place];  Service: General;  Laterality: Left;   KNEE ARTHROSCOPY Right 07/17/2010   LEFT HEART CATHETERIZATION WITH CORONARY ANGIOGRAM N/A 07/27/2012   Procedure: LEFT HEART CATHETERIZATION WITH CORONARY ANGIOGRAM;  Surgeon: Sherren Mocha, MD;  Location: [facility] C[facility];  Service: Cardiovascular;  Laterality: N/A;   MASS EXCISION N/A 06/29/2013   Procedure:  WIDE LOCAL EXCISION OF POSTERIOR NECK ABSCESS;  Surgeon: Ralene Ok, MD;  Location: [place];  Service: General;  Laterality: N/A;   REPAIR KNEE LIGAMENT Left    "fixed ligaments and chipped patella"   right transmetatarsal amputation      HPI:  Pt presented to [facility] ED 7/31 as a transfer from [facility] ED for further evaluation of progressively worsening throat pain and dysphagia. 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 PMHx significant for HTN, HLD, acute-on-chronic CHF, CAD, COPD/asthma, OSA on CPAP, T2DM (c/b diabetic peripheral neuropathy and bilateral BKAs), GERD, bipolar disorder. CT Neck 7/31 demonstrated edema and inflammatory stranding around the left submandibular gland, consistent with acute left submandibular sialoadenitis.  Edema and inflammatory stranding were also noted to extend into the ventral upper neck with foci of gas within the left submandibular gland, possibly secondary to infection with gas-producing organism and associated pharyngitis.  While awaiting floor bed, patient decompensated from an airway protection standpoint, became stridorous and PCCM was consulted for admission.  On arrival to evaluate patient, anesthesia was preparing to intubate. Intubation was successful on second attempt.  Dr. Constance Holster with ENT was present at bedside during intubation, noting excessive purulent  secretions but no significant swelling of the supraglottic larynx recommendation for conservative management with IV antibiotics.    Assessment / Plan / Recommendation  Clinical Impression  Pt presents with concern for component of pharyngeal dysphagia given recent intubation (6 days this admission) and hx of recent traumatic intubation with residual pharyngeal abscess (active tx with antibiotics). She was alert and requesting ice chips. Vocal quality was hoarse and dysphonic. Pt exhibited frequent isolated coughing intermittently with single ice chips and small sips of water concerning for decreased airway protection. Recommend continue NPO with meds via alternative means and follow up instrumental swallow study. SLP to follow up.  SLP Visit Diagnosis: Dysphagia, unspecified (R13.10)    Aspiration Risk  Moderate aspiration risk;Risk for inadequate nutrition/hydration    Diet Recommendation   NPO (okay for ice chips with staff following oral care as tolerated)  Medication Administration: Via alternative means    Other  Recommendations Oral Care Recommendations: Oral care QID;Oral care prior to ice chip/H20    Recommendations for follow up therapy are one component of a multi-disciplinary discharge planning process, led by the attending physician.  Recommendations may be updated based on patient status, additional functional criteria and insurance authorization.  Follow  up Recommendations Other (comment) (TBD)      Assistance Recommended at Discharge    Functional Status Assessment Patient has had a recent decline in their functional status and demonstrates the ability to make significant improvements in function in a reasonable and predictable amount of time.  Frequency and Duration min 2x/week  2 weeks       Prognosis Prognosis for Safe Diet Advancement: Good Barriers to Reach Goals: Time post onset;Severity of deficits      Swallow Study   General Date of Onset: 07/05/22 HPI:  Pt presented to [facility] ED 7/31 as a transfer from [facility] ED for further evaluation of progressively worsening throat pain and dysphagia. 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 PMHx significant for HTN, HLD, acute-on-chronic CHF, CAD, COPD/asthma, OSA on CPAP, T2DM (c/b diabetic peripheral neuropathy and bilateral BKAs), GERD, bipolar disorder. CT Neck 7/31 demonstrated edema and inflammatory stranding around the left submandibular gland, consistent with acute left submandibular sialoadenitis.  Edema and inflammatory stranding were also noted to extend into the ventral upper neck with foci of gas within the left submandibular gland, possibly secondary to infection with gas-producing organism and associated pharyngitis.  While awaiting floor bed, patient decompensated from an airway protection standpoint, became stridorous and PCCM was consulted for admission.  On arrival to evaluate patient, anesthesia was preparing to intubate. Intubation was successful on second attempt.  Dr. Constance Holster with ENT was present at bedside during intubation, noting excessive purulent secretions but no significant swelling of the supraglottic larynx recommendation for conservative management with IV antibiotics. Type of Study: Bedside Swallow Evaluation Previous Swallow Assessment: none on file Diet Prior to this Study: NPO Temperature Spikes Noted: No Respiratory Status: Nasal cannula History of Recent Intubation: Yes Length of Intubations (days): 6 days Date extubated: 07/10/22 Behavior/Cognition: Alert;Requires cueing Oral Cavity Assessment: Dry Oral Care Completed by SLP: Yes Oral Cavity - Dentition: Adequate natural dentition Vision: Functional for self-feeding Self-Feeding Abilities: Needs assist Patient Positioning: Upright in bed Baseline Vocal Quality: Low vocal intensity;Hoarse Volitional Cough: Strong Volitional Swallow: Able to elicit    Oral/Motor/Sensory Function Overall Oral Motor/Sensory Function: Generalized oral weakness    Ice Chips Ice chips: Impaired Presentation: Spoon Oral Phase Impairments: Reduced lingual movement/coordination Oral Phase Functional Implications: Prolonged oral transit Pharyngeal Phase Impairments: Suspected delayed Swallow;Multiple swallows;Cough - Delayed   Thin Liquid Thin Liquid: Impaired Presentation: Cup Oral Phase Impairments: Reduced labial seal;Reduced lingual movement/coordination Oral Phase Functional Implications: Prolonged oral transit Pharyngeal  Phase Impairments: Suspected delayed Swallow;Multiple swallows;Cough - Delayed    Nectar Thick Nectar Thick Liquid: Not tested   Honey Thick Honey Thick Liquid: Not tested   Puree Puree: Not tested   Solid     Solid: Not tested      Hayden Rasmussen MA, CCC-SLP Acute Rehabilitation Services   07/10/2022,12:50 PM

## 2022-07-10 NOTE — Op Note (Signed)
OPERATIVE REPORT  DATE OF SURGERY: 07/10/2022  PATIENT:  [name],  46 y.o. female  PRE-OPERATIVE DIAGNOSIS:  Extubation  POST-OPERATIVE DIAGNOSIS:  Extubation  PROCEDURE:  Procedure(s): CONTROLLED EXTUBATION  SURGEON:  Beckie Salts, MD  ASSISTANTS: None  ANESTHESIA:   None  EBL: 0 ml  DRAINS: None  LOCAL MEDICATIONS USED:  None  SPECIMEN:  none  COUNTS:  Correct  PROCEDURE DETAILS: The patient was taken to the operating room and placed on the operating table in the supine position.  At this point she had been completely awoken from sedation.  Respiratory function was monitored by anesthesia and when appropriate the patient was extubated.  She was placed on oxygen and then BiPAP.  The BiPAP was then removed.  She maintained good saturations had no stridor and was tolerating spontaneous breathing without difficulty.  She was observed in the operating room for about 25 more minutes and then transferred back to ICU.    PATIENT DISPOSITION:  To ICU, stable

## 2022-07-10 NOTE — Transfer of Care (Signed)
Immediate Anesthesia Transfer of Care Note  Patient: [name]  Procedure(s) Performed: CONTROLLED EXTUBATION  Patient Location: ICU  Anesthesia Type: Extubation in the OR  Level of Consciousness: awake and alert   Airway & Oxygen Therapy: Patient Spontanous Breathing and Patient connected to face mask oxygen  Post-op Assessment: Report given to RN and Post -op Vital signs reviewed and stable  Post vital signs: Reviewed and stable  Last Vitals:  Vitals Value Taken Time  BP 144/54 07/10/22 0930  Temp    Pulse 94 07/10/22 0945  Resp 9 07/10/22 0945  SpO2 94 % 07/10/22 0945  Vitals shown include unvalidated device data.  Last Pain:  Vitals:   07/10/22 0730  TempSrc: Esophageal  PainSc:       Patients Stated Pain Goal: 0 (09/81/19 1478)  Complications: No notable events documented.

## 2022-07-10 NOTE — Progress Notes (Deleted)
eLink Physician-Brief Progress Note Patient Name: [name] DOB: Jun 09, 1976 MRN: [id number]   Date of Service  07/10/2022  HPI/Events of Note  Urine retention 595cc on bladder scan  eICU Interventions  Foley order placed Scheduled for OR this am     Intervention Category Minor Interventions: Routine modifications to care plan (e.g. PRN medications for pain, fever)  Vong Garringer Rodman Pickle 07/10/2022, 12:57 AM

## 2022-07-10 NOTE — Progress Notes (Addendum)
NAME:  [name], MRN:  [id number], DOB:  [birth year], LOS: 5 ADMISSION DATE:  07/05/2022 CONSULTATION DATE:  07/05/2022 REFERRING MD:  Jeani Hawking - FMTS CHIEF COMPLAINT:  Ludwig's angina, compromised airway   History of Present Illness:  46 year old woman who presented to [facility] ED 7/31 as a transfer from [facility] ED for further evaluation of progressively worsening throat pain and dysphagia. 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 PMHx significant for HTN, HLD, acute-on-chronic CHF, CAD, COPD/asthma, OSA on CPAP, T2DM (c/b diabetic peripheral neuropathy and bilateral BKAs), GERD, bipolar disorder.  Patient initially contacted family medicine after hours phone line regarding neck soreness and "mucus" in the back of her throat causing difficulty breathing.  Also reported dysphagia with inability to take PO.  At the time of the phone call denied any blood or associated cough with throat discomfort.  Patient presented to [facility] ED a few hours later via EMS with complaints of progressively worsening throat discomfort and dysphagia since eye surgery at [facility] 7/28.  At the time of presentation, patient was noted to have increasing fullness of the area below the angle of the mandible with associated redness and severe pain with concern for deep space infection of the neck, possibly Ludwig's angina vs. tracheal perforation.  Patient then developed trismus with concern for airway compromise.  ENT was called for evaluation.  Empiric cefepime/vanc were initiated.  Patient was transferred from [facility] ED to [facility] ED for further care and fiberoptic nasopharyngoscopy with ENT.  CT Neck 7/31 demonstrated edema and inflammatory stranding around the left submandibular gland, consistent with acute left submandibular sialoadenitis.  Edema and inflammatory stranding were also noted to extend into the ventral upper neck with foci of gas within the left submandibular gland, possibly secondary to infection with gas-producing organism and  associated pharyngitis.  While awaiting floor bed, patient decompensated from an airway protection standpoint, became stridorous and PCCM was consulted for admission.  On arrival to evaluate patient, anesthesia was preparing to intubate. Intubation was successful on second attempt.  Dr. Constance Holster with ENT was present at bedside during intubation, noting excessive purulent secretions but no significant swelling of the supraglottic larynx recommendation for conservative management with IV antibiotics.  PCCM admitted patient for further care.  Pertinent Medical History:   Past Medical History:  Diagnosis Date   Abscess of groin, left    Acute on chronic diastolic (congestive) heart failure ([place]) 02/05/2022   Acute on chronic respiratory failure with hypoxia ([name]) 04/11/2022   Acute osteomyelitis of ankle or foot, left ([place]) 01/31/2018   Acute respiratory failure with hypoxia and hypercarbia ([place]) 06/15/2020   Alveolar hypoventilation    Amputation stump infection ([place]) 04/09/2018   Anemia    not on iron pill   Asthma    Bipolar 2 disorder ([name])    Candidiasis of vagina 05/15/2022   Carpal tunnel syndrome on right    recurrent   Cellulitis 08/2010-08/2011   Chest pain with low risk for cardiac etiology 05/11/2021   Chronic pain    COPD (chronic obstructive pulmonary disease) ([name])    Symbicort daily and Proventil as needed   Costochondritis    De Quervain's tenosynovitis, bilateral 11/01/2015   Depression    Diabetes mellitus type II, uncontrolled 2000   Type 2, Uncontrolled.Takes Lantus daily.Fasting blood sugar runs 150   Diabetic ulcer of right foot ([place]) 12/19/2021   Drug-seeking behavior    Elevated troponin    Exposure to mold 04/21/2020   GERD (gastroesophageal reflux disease)  takes Pantoprazole and Zantac daily   HLD (hyperlipidemia)    takes Atorvastatin daily   Hypertension    takes Lisinopril and Coreg daily   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[place]) 04/11/2019   Left shoulder pain 06/23/2019    Morbid obesity ([place])    Morbid obesity with BMI of 60.0-69.9, adult ([place]) 04/15/2021   Nocturia    Nonobstructive atherosclerosis of coronary artery 11/26/2020   11/26/20 Coronary angiogram: 1.  Mild diffuse proximal LAD stenosis with no evidence of obstructive disease (20 to 30% diffuse stenosis);  2.  Left dominant circumflex with widely patent obtuse marginal branches and left PDA branch, no significant stenoses present  3.  Nondominant RCA with mild diffuse nonobstructive stenosis    OSA on CPAP    Peripheral neuropathy    takes Gabapentin daily   Pneumonia    "walking" several yrs ago and as a baby (12/05/2018)   Pneumonia due to COVID-19 virus 04/14/2021   Primary osteoarthritis of first carpometacarpal joint of left hand 07/30/2016   Rectal fissure    Restless leg    Right carpal tunnel syndrome 09/01/2011   SVT (supraventricular tachycardia) ([place])    Syncope 02/25/2016   Thrombocytosis 04/11/2022   Urinary frequency    Urinary incontinence 10/23/2020   UTI (urinary tract infection) 04/15/2021   Varicose veins    Right medial thigh and Left leg    Significant Hospital Events: Including procedures, antibiotic start and stop dates in addition to other pertinent events   7/28 - Underwent eye surgery at [place] 7/31 Presented to [facility] ED via EMS for progressively worsening throat pain and associated dysphagia, ENT consulted and recommended transfer to [facility] ED for nasopharyngeal scope.While awaiting bed became stridorous with concern for airway compromise and anesthesia called to intubate. Intubated in ED. PCCM consulted for admission and escalation of care. 8/1 - transitioned abx to Unasyn, MAP 50s > transitioned to Precedex and Fentanyl with Levophed if needed, development of AKI 8/2 - febrile to 102.1, start tube feeds 8/3 - nephrology consulted for worsening kidney function  Interim History / Subjective:  This AM awake and gagging on vent. Remains on PS   Objective:   Blood pressure (!) 148/52, pulse (!) 108, temperature 100.1 F (37.8 C), temperature source Esophageal, resp. rate (!) 25, height 5' (1.524 m), weight (!) 157.1 kg, last menstrual period 07/05/2022, SpO2 98 %.    Vent Mode: PCV FiO2 (%):  [30 %] 30 % Set Rate:  [18 bmp] 18 bmp PEEP:  [8 cmH20] 8 cmH20 Pressure Support:  [5 cmH20] 5 cmH20 Plateau Pressure:  [22 cmH20-24 cmH20] 23 cmH20   Intake/Output Summary (Last 24 hours) at 07/10/2022 0731 Last data filed at 07/10/2022 0600 Gross per 24 hour  Intake 1989.1 ml  Output 3400 ml  Net -1410.9 ml   Filed Weights   07/08/22 0457 07/09/22 0500 07/10/22 0500  Weight: (!) 155.8 kg (!) 156.8 kg (!) 157.1 kg   Physical Examination: General: Chronically ill appearing adult female, lying in bed on vent  Neuro: Alert, follows commands HEENT: ETT/OG in place  Cardiovascular: RRR, no M/R/G.  Lungs: Clear breath sounds, +cuff leak  Abdomen: Morbidly obese. Distended, non-tender, active bowel sounds  Musculoskeletal: R transmetatarsal amputation, L BKA.  Skin: Intact, warm, no rashes.  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 - ENT following - Continue Unasyn 3g IV q6h - OK for extubation from out standpoint, Dr. Lamonte Sakai discussed  with Dr. Constance Holster 8/3 and plan is for extubation in OR incase she does not tolerate and requires emergent tracheostomy. >> Plan for OR early this AM.   Acute hypoxemic respiratory failure in the setting of deep soft tissue throat infection as above. Hx COPD, OSA on CPAP. - Plan for OR today.  - Continue VAP prevention bundle  - Continue BD's, bronchial hygiene. - Start home nocturnal CPAP once extubated.  Acute on chronic diastolic heart failure Hx CAD, HTN, HLD. - Monitor strict I&Os - Continue ASA - Hold home amlodipine for now. - hold diuretics for now   AKI on CKD3a - ? ATN vs AIN. Given abrupt jump in UOP an stable SCr, hopeful for diuretic phase of ATN -  Nephrology following, appreciate the assistance - Follow BMP. - Hopeful to avoid dialysis (of note, pt is against long term dialysis per reports).  Type 2 diabetes mellitus Poorly controlled in the 200s-300s. Home regimen includes degludec 52u BID, Novolog 20u TID - SSI + Levemir.  Diabetic peripheral neuropathy. - Hold home gabapentin, Cymbalta  At risk for malnutrition. - Nutrition following, appreciate the assistance. - Continue tube feeds. (Goal extubation today, if unable to pass swallow, may need cortrack)   Constipation. Without bowel movements for last couple days.  - Senna BID, Miralax 17g BID. Colace  - suppository PRN. 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 - F/u as outpatient 2 weeks after discharge (Dr. Posey Pronto at [place]).  Bipolar 2 disorder. - Supportive care.  Best Practice: (right click and "Reselect all SmartList Selections" daily)   Diet/type: tubefeeds DVT prophylaxis: prophylactic heparin  GI prophylaxis: PPI Lines: N/A Foley:  Yes, and it is still needed Code Status:  full code Last date of multidisciplinary goals of care discussion [Pending]  Critical care time: 42 minutes   CRITICAL CARE Performed by: Omar Person   Total critical care time: 42 minutes  Critical care time was exclusive of separately billable procedures and treating other patients.  Critical care was necessary to treat or prevent imminent or life-threatening deterioration.  Critical care was time spent personally by me on the following activities: development of treatment plan with patient and/or surrogate as well as nursing, discussions with consultants, evaluation of patient's response to treatment, examination of patient, obtaining history from patient or surrogate, ordering and performing treatments and interventions, ordering and review of laboratory studies, ordering and review of radiographic studies, pulse  oximetry and re-evaluation of patient's condition.  Hayden Pedro, AGACNP-BC [place] [facility] For pager details, please see AMION or use Epic chat  After 1900, please call [facility] for cross coverage needs

## 2022-07-10 NOTE — Progress Notes (Signed)
[place] Progress Note Patient Name: [name] DOB: [birth year] MRN: [id number]   Date of Service  07/10/2022  HPI/Events of Note  Patient with itching and unable to take po due to nausea.  eICU Interventions  Benadryl 12.5 mg iv x 1 ordered.        Kerry Kass Tanielle Emigh 07/10/2022, 10:41 PM

## 2022-07-10 NOTE — Progress Notes (Signed)
Pt transported down to OR on vent to be extubated. After extubation placed on BiPAP for about 5 minutes. Took BiPAP off and placed on simple mask. Pt brought back up on simple mask and placed on 3L [place] at this time. No complications during transport. Rt will continue to monitor.

## 2022-07-10 NOTE — Interval H&P Note (Signed)
History and Physical Interval Note:  07/10/2022 8:04 AM  [name]  has presented today for surgery, with the diagnosis of Extubation.  The various methods of treatment have been discussed with the patient and family.  I was unable to reach the mother yesterday over the phone.  Therefore tracheostomy will only be performed as a lifesaving procedure.  After consideration of risks, benefits and other options for treatment, the patient has consented to  Procedure(s): CONTROLLED EXTUBATION WITH POSSIBLE TRACHEOSTOMY (N/A) as a surgical intervention.  The patient's history has been reviewed, patient examined, no change in status, stable for surgery.  I have reviewed the patient's chart and labs.  Questions were answered to the patient's satisfaction.     Izora Gala

## 2022-07-11 ENCOUNTER — Inpatient Hospital Stay (HOSPITAL_COMMUNITY): Payer: Medicaid Other

## 2022-07-11 DIAGNOSIS — J391 Other abscess of pharynx: Secondary | ICD-10-CM | POA: Diagnosis not present

## 2022-07-11 DIAGNOSIS — J9601 Acute respiratory failure with hypoxia: Secondary | ICD-10-CM | POA: Diagnosis not present

## 2022-07-11 DIAGNOSIS — K122 Cellulitis and abscess of mouth: Secondary | ICD-10-CM | POA: Diagnosis not present

## 2022-07-11 LAB — CBC
HCT: 33.8 % — ABNORMAL LOW (ref 36.0–46.0)
Hemoglobin: 10.5 g/dL — ABNORMAL LOW (ref 12.0–15.0)
MCH: 29.3 pg (ref 26.0–34.0)
MCHC: 31.1 g/dL (ref 30.0–36.0)
MCV: 94.4 fL (ref 80.0–100.0)
Platelets: 447 10*3/uL — ABNORMAL HIGH (ref 150–400)
RBC: 3.58 MIL/uL — ABNORMAL LOW (ref 3.87–5.11)
RDW: 15.1 % (ref 11.5–15.5)
WBC: 13.1 10*3/uL — ABNORMAL HIGH (ref 4.0–10.5)
nRBC: 0 % (ref 0.0–0.2)

## 2022-07-11 LAB — PHOSPHORUS: Phosphorus: 3.3 mg/dL (ref 2.5–4.6)

## 2022-07-11 LAB — BASIC METABOLIC PANEL
Anion gap: 10 (ref 5–15)
BUN: 56 mg/dL — ABNORMAL HIGH (ref 6–20)
CO2: 27 mmol/L (ref 22–32)
Calcium: 9.3 mg/dL (ref 8.9–10.3)
Chloride: 109 mmol/L (ref 98–111)
Creatinine, Ser: 2.67 mg/dL — ABNORMAL HIGH (ref 0.44–1.00)
GFR, Estimated: 22 mL/min — ABNORMAL LOW (ref >60)
Glucose, Bld: 126 mg/dL — ABNORMAL HIGH (ref 70–99)
Potassium: 3.9 mmol/L (ref 3.5–5.1)
Sodium: 146 mmol/L — ABNORMAL HIGH (ref 135–145)

## 2022-07-11 LAB — GLUCOSE, CAPILLARY
Glucose-Capillary: 111 mg/dL — ABNORMAL HIGH (ref 70–99)
Glucose-Capillary: 126 mg/dL — ABNORMAL HIGH (ref 70–99)
Glucose-Capillary: 159 mg/dL — ABNORMAL HIGH (ref 70–99)
Glucose-Capillary: 161 mg/dL — ABNORMAL HIGH (ref 70–99)
Glucose-Capillary: 202 mg/dL — ABNORMAL HIGH (ref 70–99)
Glucose-Capillary: 205 mg/dL — ABNORMAL HIGH (ref 70–99)

## 2022-07-11 LAB — MAGNESIUM: Magnesium: 2.7 mg/dL — ABNORMAL HIGH (ref 1.7–2.4)

## 2022-07-11 LAB — TROPONIN I (HIGH SENSITIVITY): Troponin I (High Sensitivity): 32 ng/L — ABNORMAL HIGH (ref <18)

## 2022-07-11 MED ORDER — ORAL CARE MOUTH RINSE
15.0000 mL | OROMUCOSAL | Status: DC | PRN
Start: 2022-07-11 — End: 2022-07-14

## 2022-07-11 MED ORDER — DEXTROSE IN LACTATED RINGERS 5 % IV SOLN: INTRAVENOUS | Status: DC

## 2022-07-11 MED ORDER — METOCLOPRAMIDE HCL 5 MG/ML IJ SOLN
10.0000 mg | Freq: Once | INTRAMUSCULAR | Status: AC
Start: 2022-07-11 — End: 2022-07-11
  Administered 2022-07-11: 10 mg via INTRAVENOUS
  Filled 2022-07-11: qty 2

## 2022-07-11 MED ORDER — NEPRO/CARBSTEADY PO LIQD
1000.0000 mL | ORAL | Status: DC
  Administered 2022-07-11: 1000 mL
  Filled 2022-07-11 (×3): qty 1000

## 2022-07-11 MED ORDER — FREE WATER
100.0000 mL | Freq: Four times a day (QID) | Status: DC
Start: 2022-07-11 — End: 2022-07-12
  Administered 2022-07-11 – 2022-07-12 (×3): 100 mL

## 2022-07-11 MED ORDER — SODIUM CHLORIDE 0.9 % IV SOLN
3.0000 g | Freq: Three times a day (TID) | INTRAVENOUS | Status: DC
  Administered 2022-07-11 – 2022-07-13 (×7): 3 g via INTRAVENOUS
  Filled 2022-07-11 (×9): qty 8

## 2022-07-11 NOTE — Progress Notes (Signed)
Patient was started on NIV for the night. Patient was very apprehensive about trying it due to anxiety and stating that the mask will be uncomfortable because it is not her own. Patient was willing to try the mask and within five minutes patient ask for the mask to be removed. Mask was removed and patient is on a nasal cannula at this time.

## 2022-07-11 NOTE — Progress Notes (Signed)
Speech Language Pathology Treatment: Dysphagia  Patient Details Name: [name] MRN: [id number] DOB: [birth year] Today's Date: 07/11/2022 Time: 5916-3846 SLP Time Calculation (min) (ACUTE ONLY): 20 min  Assessment / Plan / Recommendation Clinical Impression  Patient seen for diagnostic treatment, including po trials, to determine potential for diet upgrade or instrumental testing. Vocal quality remains mildly hoarse but improved. She continues to present with immediate overt s/s of aspiration post swallow with thin liquids characterized by coughing. Suspect combination of  post extubation edema and sensory changes impacting speed of swallow. No overt s/s of aspiration noted with pureed solid trials. Instrumental testing, FEES, recommended to evaluate swallowing physiology prior to initiating po trials. Plan for FEES next date. Patient in agreement. Consent form signed.    HPI HPI: Pt presented to [facility] ED 7/31 as a transfer from [facility] ED for further evaluation of progressively worsening throat pain and dysphagia. 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 PMHx significant for HTN, HLD, acute-on-chronic CHF, CAD, COPD/asthma, OSA on CPAP, T2DM (c/b diabetic peripheral neuropathy and bilateral BKAs), GERD, bipolar disorder. CT Neck 7/31 demonstrated edema and inflammatory stranding around the left submandibular gland, consistent with acute left submandibular sialoadenitis.  Edema and inflammatory stranding were also noted to extend into the ventral upper neck with foci of gas within the left submandibular gland, possibly secondary to infection with gas-producing organism and associated pharyngitis.  While awaiting floor bed, patient decompensated from an airway protection standpoint, became stridorous and PCCM was consulted for admission.  On arrival to evaluate patient, anesthesia was preparing to intubate. Intubation was successful on second attempt.  Dr. Constance Holster with ENT was present at bedside during intubation, noting  excessive purulent secretions but no significant swelling of the supraglottic larynx recommendation for conservative management with IV antibiotics.      SLP Plan  Other (Comment) (FEES next date)      Recommendations for follow up therapy are one component of a multi-disciplinary discharge planning process, led by the attending physician.  Recommendations may be updated based on patient status, additional functional criteria and insurance authorization.    Recommendations  Diet recommendations: NPO (except ice chips after oral care) Medication Administration: Via alternative means                Oral Care Recommendations: Oral care QID;Oral care prior to ice chip/H20 SLP Visit Diagnosis: Dysphagia, unspecified (R13.10) Plan: Other (Comment) (FEES next date)        Belinda Rainwater MA, CCC-SLP    [name] [name]  07/11/2022, 1:28 PM

## 2022-07-11 NOTE — Progress Notes (Signed)
[place] Progress Note Patient Name: [name] DOB: [birth year] MRN: [id number]   Date of Service  07/11/2022  HPI/Events of Note  Patient was asking for iv medications for anxiety but on camera she is fast asleep.  eICU Interventions  Will defer further potentially sedating medications.        Kerry Kass Dennisha Mouser 07/11/2022, 12:46 AM

## 2022-07-11 NOTE — Progress Notes (Addendum)
RT spoke with patient about wearing her home BiPAP unit tonight.  Patient has an NG tube and the RN confirmed that it will be running all night.  Advised patient and RN at bedside that will hold off on placing patient on her BiPAP until the NG is taken out.  Will increase patient O2 while she sleeps at night.  RT will continue to monitor.

## 2022-07-11 NOTE — Assessment & Plan Note (Addendum)
Patient became stridorous while waiting for floor bed and required intubation (successful on second attempt). Placed on MV in ICU, now extubated but continues to require oxygen. On scheduled and PRN nebulizers, oxygen via nasal cannula. Sleeps with BIPAP. Now on RA, SpO2 in 90s - can resume BIPAP - oxygen via nasal cannula PRN - continuous pulse ox - continue arformoterol, duoneb - continue albuterol PRN - f/u CXR

## 2022-07-11 NOTE — Progress Notes (Signed)
NAME:  [name], MRN:  [id number], DOB:  12/18/1975, LOS: 6 ADMISSION DATE:  07/05/2022 CONSULTATION DATE:  07/05/2022 REFERRING MD:  Jeani Hawking - FMTS CHIEF COMPLAINT:  Ludwig's angina, compromised airway   History of Present Illness:  46 year old woman who presented to [facility] ED 7/31 as a transfer from [facility] ED for further evaluation of progressively worsening throat pain and dysphagia. 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 PMHx significant for HTN, HLD, acute-on-chronic CHF, CAD, COPD/asthma, OSA on CPAP, T2DM (c/b diabetic peripheral neuropathy and bilateral BKAs), GERD, bipolar disorder.  Patient initially contacted family medicine after hours phone line regarding neck soreness and "mucus" in the back of her throat causing difficulty breathing.  Also reported dysphagia with inability to take PO.  At the time of the phone call denied any blood or associated cough with throat discomfort.  Patient presented to [facility] ED a few hours later via EMS with complaints of progressively worsening throat discomfort and dysphagia since eye surgery at [facility] 7/28.  At the time of presentation, patient was noted to have increasing fullness of the area below the angle of the mandible with associated redness and severe pain with concern for deep space infection of the neck, possibly Ludwig's angina vs. tracheal perforation.  Patient then developed trismus with concern for airway compromise.  ENT was called for evaluation.  Empiric cefepime/vanc were initiated.  Patient was transferred from [facility] ED to [facility] ED for further care and fiberoptic nasopharyngoscopy with ENT.  CT Neck 7/31 demonstrated edema and inflammatory stranding around the left submandibular gland, consistent with acute left submandibular sialoadenitis.  Edema and inflammatory stranding were also noted to extend into the ventral upper neck with foci of gas within the left submandibular gland, possibly secondary to infection with gas-producing organism and  associated pharyngitis.  While awaiting floor bed, patient decompensated from an airway protection standpoint, became stridorous and PCCM was consulted for admission.  On arrival to evaluate patient, anesthesia was preparing to intubate. Intubation was successful on second attempt.  Dr. Constance Holster with ENT was present at bedside during intubation, noting excessive purulent secretions but no significant swelling of the supraglottic larynx recommendation for conservative management with IV antibiotics.  Pertinent Medical History:   Past Medical History:  Diagnosis Date   Abscess of groin, left    Acute on chronic diastolic (congestive) heart failure ([place]) 02/05/2022   Acute on chronic respiratory failure with hypoxia ([name]) 04/11/2022   Acute osteomyelitis of ankle or foot, left ([place]) 01/31/2018   Acute respiratory failure with hypoxia and hypercarbia ([place]) 06/15/2020   Alveolar hypoventilation    Amputation stump infection ([place]) 04/09/2018   Anemia    not on iron pill   Asthma    Bipolar 2 disorder ([name])    Candidiasis of vagina 05/15/2022   Carpal tunnel syndrome on right    recurrent   Cellulitis 08/2010-08/2011   Chest pain with low risk for cardiac etiology 05/11/2021   Chronic pain    COPD (chronic obstructive pulmonary disease) ([name])    Symbicort daily and Proventil as needed   Costochondritis    De Quervain's tenosynovitis, bilateral 11/01/2015   Depression    Diabetes mellitus type II, uncontrolled 2000   Type 2, Uncontrolled.Takes Lantus daily.Fasting blood sugar runs 150   Diabetic ulcer of right foot ([place]) 12/19/2021   Drug-seeking behavior    Elevated troponin    Exposure to mold 04/21/2020   GERD (gastroesophageal reflux disease)    takes Pantoprazole and Zantac daily  HLD (hyperlipidemia)    takes Atorvastatin daily   Hypertension    takes Lisinopril and Coreg daily   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[place]) 04/11/2019   Left shoulder pain 06/23/2019   Morbid obesity ([place])    Morbid obesity  with BMI of 60.0-69.9, adult ([place]) 04/15/2021   Nocturia    Nonobstructive atherosclerosis of coronary artery 11/26/2020   11/26/20 Coronary angiogram: 1.  Mild diffuse proximal LAD stenosis with no evidence of obstructive disease (20 to 30% diffuse stenosis);  2.  Left dominant circumflex with widely patent obtuse marginal branches and left PDA branch, no significant stenoses present  3.  Nondominant RCA with mild diffuse nonobstructive stenosis    OSA on CPAP    Peripheral neuropathy    takes Gabapentin daily   Pneumonia    "walking" several yrs ago and as a baby (12/05/2018)   Pneumonia due to COVID-19 virus 04/14/2021   Primary osteoarthritis of first carpometacarpal joint of left hand 07/30/2016   Rectal fissure    Restless leg    Right carpal tunnel syndrome 09/01/2011   SVT (supraventricular tachycardia) ([place])    Syncope 02/25/2016   Thrombocytosis 04/11/2022   Urinary frequency    Urinary incontinence 10/23/2020   UTI (urinary tract infection) 04/15/2021   Varicose veins    Right medial thigh and Left leg    Significant Hospital Events: Including procedures, antibiotic start and stop dates in addition to other pertinent events   7/28 - Underwent eye surgery at [facility] 7/31 Presented to [facility] ED via EMS for progressively worsening throat pain and associated dysphagia, ENT consulted and recommended transfer to [facility] ED for nasopharyngeal scope.While awaiting bed became stridorous with concern for airway compromise and anesthesia called to intubate. Intubated in ED. PCCM consulted for admission and escalation of care. 8/1 - transitioned abx to Unasyn, MAP 50s > transitioned to Precedex and Fentanyl with Levophed if needed, development of AKI 8/2 - febrile to 102.1, start tube feeds 8/3 - nephrology consulted for worsening kidney function 8/5 - extubated in OR  Interim History / Subjective:  NG tube placed as patient failed bedside swallow and ST evaluations. Overnight  with nausea, vomiting. Continued to hold TF.   Objective:  Blood pressure 125/68, pulse 82, temperature 97.7 F (36.5 C), temperature source Oral, resp. rate 11, height 5' (1.524 m), weight (!) 157.1 kg, last menstrual period 07/05/2022, SpO2 100 %.    Vent Mode: BIPAP;PCV FiO2 (%):  [40 %] 40 % Set Rate:  [10 bmp] 10 bmp PEEP:  [5 cmH20] 5 cmH20   Intake/Output Summary (Last 24 hours) at 07/11/2022 0904 Last data filed at 07/11/2022 0600 Gross per 24 hour  Intake 100.48 ml  Output 4550 ml  Net -4449.52 ml   Filed Weights   07/09/22 0500 07/10/22 0500 07/11/22 0500  Weight: (!) 156.8 kg (!) 157.1 kg (!) 157.1 kg   Physical Examination: General: Chronically ill appearing adult female, lying in bed Neuro: Alert, oriented, follows commands HEENT: NG tube in place  Cardiovascular: RRR, no M/R/G.  Lungs: Clear breath sounds,  Abdomen: Morbidly obese. Distended, non-tender, hypoactive bowel sounds  Musculoskeletal: R transmetatarsal amputation, L BKA.  Skin: Intact, warm, no rashes.  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 - ENT following - Continue Unasyn 3g IV q6h  Acute on Chronic hypoxemic respiratory failure in the setting of deep soft tissue throat infection as above. - 2L [place] baseline  -  Extubated 8/5 Hx COPD, OSA on CPAP. - Titrate supplemental oxygen for saturations > 90 - Continue BD's, bronchial hygiene. - CPAP at HS   Acute on chronic diastolic heart failure Hx CAD, HTN, HLD. - Monitor strict I&Os - Continue ASA - Hold home amlodipine for now. - hold diuretics for now   AKI on CKD3a - ? ATN vs AIN. Given abrupt jump in UOP an stable SCr, hopeful for diuretic phase of ATN > improving  Hypernatremia  - Nephrology following, appreciate the assistance - Follow BMP. - Hopeful to avoid dialysis (of note, pt is against long term dialysis per reports). - Start D5 LR and trend NA.   Dysphagia s/p intubation   Plan - NPO for now - ST following   Nausea  Constipation  At risk for malnutrition. Plan - PRN Zofran  - NPO for now. NG tube in place, once nausea/vomiting improves can start.  - KUB pending  - Continue Bowel regimen with Senna BID, Miralax 17g BID. Colace , given additional enema today   Type 2 diabetes mellitus Poorly controlled in the 200s-300s. Home regimen includes degludec 52u BID, Novolog 20u TID - SSI + Levemir.  Diabetic peripheral neuropathy. 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 - F/u as outpatient 2 weeks after discharge (Dr. Posey Pronto at [facility]).  Bipolar 2 disorder. - Supportive care.  Best Practice: (right click and "Reselect all SmartList Selections" daily)   Diet/type: NPO DVT prophylaxis: prophylactic heparin  GI prophylaxis: N/A Code Status:  full code Last date of multidisciplinary goals of care discussion. Updated daughter and patient at bedside.   Critical care time: 45 minutes   CRITICAL CARE Performed by: Omar Person   Total critical care time: 45 minutes  Critical care time was exclusive of separately billable procedures and treating other patients.  Critical care was necessary to treat or prevent imminent or life-threatening deterioration.  Critical care was time spent personally by me on the following activities: development of treatment plan with patient and/or surrogate as well as nursing, discussions with consultants, evaluation of patient's response to treatment, examination of patient, obtaining history from patient or surrogate, ordering and performing treatments and interventions, ordering and review of laboratory studies, ordering and review of radiographic studies, pulse oximetry and re-evaluation of patient's condition.  Hayden Pedro, AGACNP-BC [facility] For pager  details, please see AMION or use Epic chat  After 1900, please call [facility] for cross coverage needs

## 2022-07-11 NOTE — Progress Notes (Signed)
PHARMACY NOTE:  ANTIMICROBIAL RENAL DOSAGE ADJUSTMENT  Current antimicrobial regimen includes a mismatch between antimicrobial dosage and estimated renal function.  As per policy approved by the Pharmacy & Therapeutics and Medical Executive Committees, the antimicrobial dosage will be adjusted accordingly.  Current antimicrobial dosage:  Unasyn   Indication: Ludwigs   Renal Function:  Estimated Creatinine Clearance: 38.2 mL/min (A) (by C-G formula based on SCr of 2.67 mg/dL (H)).    Antimicrobial dosage has been changed to:  Unasyn 3G Q8H   Note also serves as documentation for pharmacy to dose Unasyn.   Thank you for allowing pharmacy to be a part of this patient's care.  Ventura Sellers, [facility] 07/11/2022 10:14 AM

## 2022-07-12 ENCOUNTER — Other Ambulatory Visit: Payer: Self-pay | Admitting: Family Medicine

## 2022-07-12 ENCOUNTER — Inpatient Hospital Stay (HOSPITAL_COMMUNITY): Payer: Medicaid Other

## 2022-07-12 ENCOUNTER — Encounter (HOSPITAL_COMMUNITY): Payer: Self-pay | Admitting: Otolaryngology

## 2022-07-12 DIAGNOSIS — J391 Other abscess of pharynx: Secondary | ICD-10-CM | POA: Diagnosis not present

## 2022-07-12 DIAGNOSIS — Z9889 Other specified postprocedural states: Secondary | ICD-10-CM

## 2022-07-12 DIAGNOSIS — N170 Acute kidney failure with tubular necrosis: Secondary | ICD-10-CM

## 2022-07-12 DIAGNOSIS — L089 Local infection of the skin and subcutaneous tissue, unspecified: Secondary | ICD-10-CM | POA: Diagnosis not present

## 2022-07-12 DIAGNOSIS — J9601 Acute respiratory failure with hypoxia: Secondary | ICD-10-CM | POA: Diagnosis not present

## 2022-07-12 DIAGNOSIS — K30 Functional dyspepsia: Secondary | ICD-10-CM

## 2022-07-12 DIAGNOSIS — E870 Hyperosmolality and hypernatremia: Secondary | ICD-10-CM

## 2022-07-12 HISTORY — DX: Acute kidney failure with tubular necrosis: N17.0

## 2022-07-12 LAB — GLUCOSE, CAPILLARY
Glucose-Capillary: 171 mg/dL — ABNORMAL HIGH (ref 70–99)
Glucose-Capillary: 275 mg/dL — ABNORMAL HIGH (ref 70–99)
Glucose-Capillary: 211 mg/dL — ABNORMAL HIGH (ref 70–99)
Glucose-Capillary: 148 mg/dL — ABNORMAL HIGH (ref 70–99)

## 2022-07-12 LAB — CBC
HCT: 37.2 % (ref 36.0–46.0)
Hemoglobin: 11.5 g/dL — ABNORMAL LOW (ref 12.0–15.0)
MCH: 29.3 pg (ref 26.0–34.0)
MCHC: 30.9 g/dL (ref 30.0–36.0)
MCV: 94.9 fL (ref 80.0–100.0)
Platelets: 519 10*3/uL — ABNORMAL HIGH (ref 150–400)
RBC: 3.92 MIL/uL (ref 3.87–5.11)
RDW: 15 % (ref 11.5–15.5)
WBC: 11.6 10*3/uL — ABNORMAL HIGH (ref 4.0–10.5)
nRBC: 0 % (ref 0.0–0.2)

## 2022-07-12 LAB — BASIC METABOLIC PANEL
Anion gap: 11 (ref 5–15)
BUN: 49 mg/dL — ABNORMAL HIGH (ref 6–20)
CO2: 31 mmol/L (ref 22–32)
Calcium: 9.6 mg/dL (ref 8.9–10.3)
Chloride: 107 mmol/L (ref 98–111)
Creatinine, Ser: 1.8 mg/dL — ABNORMAL HIGH (ref 0.44–1.00)
GFR, Estimated: 35 mL/min — ABNORMAL LOW (ref >60)
Glucose, Bld: 143 mg/dL — ABNORMAL HIGH (ref 70–99)
Potassium: 4.1 mmol/L (ref 3.5–5.1)
Sodium: 149 mmol/L — ABNORMAL HIGH (ref 135–145)

## 2022-07-12 LAB — PHOSPHORUS: Phosphorus: 3.8 mg/dL (ref 2.5–4.6)

## 2022-07-12 LAB — MAGNESIUM: Magnesium: 2.7 mg/dL — ABNORMAL HIGH (ref 1.7–2.4)

## 2022-07-12 MED ORDER — AMLODIPINE BESYLATE 10 MG PO TABS: 10.0000 mg | ORAL_TABLET | Freq: Every day | ORAL | Status: DC

## 2022-07-12 MED ORDER — FREE WATER
200.0000 mL | Freq: Four times a day (QID) | Status: DC
  Administered 2022-07-12: 200 mL

## 2022-07-12 MED ORDER — GLYCERIN (LAXATIVE) 2 G RE SUPP
1.0000 | Freq: Once | RECTAL | Status: DC
  Filled 2022-07-12: qty 1

## 2022-07-12 MED ORDER — INSULIN DETEMIR 100 UNIT/ML ~~LOC~~ SOLN
35.0000 [IU] | Freq: Two times a day (BID) | SUBCUTANEOUS | Status: DC
  Administered 2022-07-13 (×2): 35 [IU] via SUBCUTANEOUS
  Filled 2022-07-12 (×3): qty 0.35

## 2022-07-12 MED ORDER — GABAPENTIN 600 MG PO TABS
600.0000 mg | ORAL_TABLET | Freq: Two times a day (BID) | ORAL | Status: DC
  Administered 2022-07-12 – 2022-07-13 (×2): 600 mg via ORAL
  Filled 2022-07-12 (×2): qty 1

## 2022-07-12 MED ORDER — HYDROXYZINE HCL 10 MG PO TABS
10.0000 mg | ORAL_TABLET | Freq: Three times a day (TID) | ORAL | Status: DC | PRN
Start: 2022-07-12 — End: 2022-07-14

## 2022-07-12 MED ORDER — ADULT MULTIVITAMIN W/MINERALS CH
1.0000 | ORAL_TABLET | Freq: Every day | ORAL | Status: DC
  Administered 2022-07-13: 1 via ORAL
  Filled 2022-07-12: qty 1

## 2022-07-12 MED ORDER — INSULIN ASPART 100 UNIT/ML IJ SOLN
0.0000 [IU] | Freq: Three times a day (TID) | INTRAMUSCULAR | Status: DC
  Administered 2022-07-13: 7 [IU] via SUBCUTANEOUS
  Administered 2022-07-13: 4 [IU] via SUBCUTANEOUS
  Administered 2022-07-13: 7 [IU] via SUBCUTANEOUS

## 2022-07-12 MED ORDER — AMLODIPINE BESYLATE 10 MG PO TABS
10.0000 mg | ORAL_TABLET | Freq: Every day | ORAL | Status: DC
  Administered 2022-07-12: 10 mg via NASOGASTRIC
  Filled 2022-07-12: qty 1

## 2022-07-12 MED ORDER — AMLODIPINE BESYLATE 10 MG PO TABS
10.0000 mg | ORAL_TABLET | Freq: Every day | ORAL | Status: DC
  Administered 2022-07-13: 10 mg via ORAL
  Filled 2022-07-12: qty 1

## 2022-07-12 MED ORDER — POLYETHYLENE GLYCOL 3350 17 G PO PACK
17.0000 g | PACK | Freq: Two times a day (BID) | ORAL | Status: DC
  Filled 2022-07-12: qty 1

## 2022-07-12 MED ORDER — ASPIRIN 81 MG PO CHEW
81.0000 mg | CHEWABLE_TABLET | Freq: Every day | ORAL | Status: DC
  Administered 2022-07-13: 81 mg via ORAL
  Filled 2022-07-12: qty 1

## 2022-07-12 MED ORDER — INSULIN ASPART 100 UNIT/ML IJ SOLN: 0.0000 [IU] | Freq: Every day | INTRAMUSCULAR | Status: DC

## 2022-07-12 NOTE — Evaluation (Signed)
Physical Therapy Evaluation Patient Details Name: [name] MRN: [id number] DOB: Apr 05, 1976 Today's Date: 07/12/2022  History of Present Illness  Pt is 46 yo female admitted on 07/05/22 with submandibular abscess with pharyngeal abscess requiring intubation.  ETT 07/05/22 - 07/10/22.  Pt had recent eye surgery 07/02/22 at [facility].  Pt also with R revisional partial first ray amputation at recent admission in 05/21/22- podiatry saw pt this hospitalization and removed stiches, recommending WBAT in surgical shoe.  Pt with hx including but not limited to CHF, COPD, DM2, HLD, HTN, CAD, OSA, L BKA  Clinical Impression   Pt admitted with above diagnosis. At recent baseline, pt requiring assist with ADLs and using slide board for transfers due to recent R 1st ray amputation.  Prior to that amputation walked short distances with RW.  Podiatry has cleared pt for WBAT with surgical shoe but pt did not have her shoe present.  Asked pt to have it brought in.  Eval limited due to did not have surgical shoe but pt was able to squat pivot to chair with decreased weight on R LE.  Pt was getting HHPT - recommend to continue. Pt currently with functional limitations due to the deficits listed below (see PT Problem List). Pt will benefit from skilled PT to increase their independence and safety with mobility to allow discharge to the venue listed below.          Recommendations for follow up therapy are one component of a multi-disciplinary discharge planning process, led by the attending physician.  Recommendations may be updated based on patient status, additional functional criteria and insurance authorization.  Follow Up Recommendations Home health PT      Assistance Recommended at Discharge Frequent or constant Supervision/Assistance  Patient can return home with the following  A little help with walking and/or transfers;A little help with bathing/dressing/bathroom;Assistance with cooking/housework     Equipment Recommendations None recommended by PT  Recommendations for Other Services       Functional Status Assessment Patient has had a recent decline in their functional status and demonstrates the ability to make significant improvements in function in a reasonable and predictable amount of time.     Precautions / Restrictions Precautions Precautions: Fall Required Braces or Orthoses: Other Brace Other Brace: R surgical shoe Restrictions Weight Bearing Restrictions: Yes RLE Weight Bearing: Weight bearing as tolerated (in surgical shoe)      Mobility  Bed Mobility Overal bed mobility: Needs Assistance Bed Mobility: Supine to Sit, Sit to Supine     Supine to sit: Modified independent (Device/Increase time) Sit to supine: Modified independent (Device/Increase time)        Transfers Overall transfer level: Needs assistance Equipment used:  (bed rail) Transfers: Bed to chair/wheelchair/BSC       Squat pivot transfers: Supervision     General transfer comment: Pt did not have her offloading shoe present so limited to pivot transfers only.  Asked her to have family member bring in shoe.  Pt was able to squat pivot both directions with limited weight on R foot with supervision.    Ambulation/Gait               General Gait Details: deferred - no surgical shoe  Stairs            Wheelchair Mobility    Modified Rankin (Stroke Patients Only)       Balance Overall balance assessment: Needs assistance Sitting-balance support: No upper extremity supported Sitting balance-Leahy Scale: Good  Standing balance support: Bilateral upper extremity supported Standing balance-Leahy Scale: Poor                               Pertinent Vitals/Pain Pain Assessment Pain Assessment: No/denies pain    Home Living Family/patient expects to be discharged to:: Private residence Living Arrangements: Children (dtr and roommate) Available Help at  Discharge: Family;Personal care attendant;Available 24 hours/day;Friend(s) (CAP Aide M-F for 8 hour shifts) Type of Home: Apartment Home Access: Level entry       Home Layout: One level Home Equipment: Rolling Walker (2 wheels);BSC/3in1;Grab bars - tub/shower;Tub bench;Wheelchair - manual;Wheelchair - power Additional Comments: LLE prosthesis; offloading shoe for R    Prior Function Prior Level of Function : Needs assist       Physical Assist : Mobility (physical);ADLs (physical)     Mobility Comments: Since June using sliding board due to R LE transmet; prior to that RW for short trips ADLs Comments: daughter and aide (5 days x8 hrs/day) assist with ADLs/IADLs, uses transportation services for getting to dr appts     Hand Dominance   Dominant Hand: Right    Extremity/Trunk Assessment   Upper Extremity Assessment Upper Extremity Assessment: Overall WFL for tasks assessed    Lower Extremity Assessment Lower Extremity Assessment: LLE deficits/detail;RLE deficits/detail RLE Deficits / Details: ROM: WFL, MMT: 0/5 ankle (hx foot drop), 5/5 knee and hip LLE Deficits / Details: Hx L BKA; ROM WFL; knee ext strength 5/5    Cervical / Trunk Assessment Cervical / Trunk Assessment: Normal  Communication   Communication: No difficulties  Cognition Arousal/Alertness: Awake/alert Behavior During Therapy: WFL for tasks assessed/performed Overall Cognitive Status: Within Functional Limits for tasks assessed                                          General Comments      Exercises     Assessment/Plan    PT Assessment Patient needs continued PT services  PT Problem List Decreased strength;Decreased mobility;Decreased safety awareness;Decreased activity tolerance;Decreased balance;Decreased knowledge of use of DME       PT Treatment Interventions DME instruction;Therapeutic activities;Gait training;Therapeutic exercise;Patient/family education;Balance  training;Functional mobility training;Wheelchair mobility training    PT Goals (Current goals can be found in the Care Plan section)  Acute Rehab PT Goals Patient Stated Goal: return home; go to cafeteria to eat PT Goal Formulation: With patient Time For Goal Achievement: 07/26/22 Potential to Achieve Goals: Good    Frequency Min 3X/week     Co-evaluation               AM-PAC PT "6 Clicks" Mobility  Outcome Measure Help needed turning from your back to your side while in a flat bed without using bedrails?: A Little Help needed moving from lying on your back to sitting on the side of a flat bed without using bedrails?: A Little Help needed moving to and from a bed to a chair (including a wheelchair)?: A Little Help needed standing up from a chair using your arms (e.g., wheelchair or bedside chair)?: A Little Help needed to walk in hospital room?: Total Help needed climbing 3-5 steps with a railing? : Total 6 Click Score: 14    End of Session   Activity Tolerance: Patient tolerated treatment well Patient left: in bed;with call bell/phone within reach Nurse  Communication: Mobility status PT Visit Diagnosis: Other abnormalities of gait and mobility (R26.89);Muscle weakness (generalized) (M62.81)    Time: 1219-7588 PT Time Calculation (min) (ACUTE ONLY): 16 min   Charges:   PT Evaluation $PT Eval Low Complexity: 1 Low          Antia Rahal, PT Acute Rehab [facility] [phone]   Karlton Lemon 07/12/2022, 3:37 PM

## 2022-07-12 NOTE — Assessment & Plan Note (Addendum)
Worsening Na, uptrending. Possibly related to renal water loss: resolving ATN (post-ATN auto-diuresis), nephrogenic diabetes insipidus (decreased kidney response to ADH), or extrarenal losses: vomiting, increased hypertonic intake. Taking free water - trend Na - consider testing urine Osm to check for DI - consider adjusting feeding - free water increased from 100 mL to 200 mL

## 2022-07-12 NOTE — Progress Notes (Signed)
OT Cancellation Note  Patient Details Name: [name] MRN: [id number] DOB: 06-17-1976   Cancelled Treatment:    Reason Eval/Treat Not Completed: Patient at procedure or test/ unavailable. Pt currently being setup for a FEES study in room.  Golden Circle, OTR/L Acute Rehab Services [facility] [phone] Office [phone]    Almon Register 07/12/2022, 11:24 AM

## 2022-07-12 NOTE — Procedures (Signed)
Objective Swallowing Evaluation: Type of Study: FEES-Fiberoptic Endoscopic Evaluation of Swallow   Patient Details  Name: [name] MRN: [id number] Date of Birth: August 04, 1976  Today's Date: 07/12/2022 Time: SLP Start Time (ACUTE ONLY): 1120 -SLP Stop Time (ACUTE ONLY): 1140  SLP Time Calculation (min) (ACUTE ONLY): 20 min   Past Medical History:  Past Medical History:  Diagnosis Date   Abscess of groin, left    Acute on chronic diastolic (congestive) heart failure ([place]) 02/05/2022   Acute on chronic respiratory failure with hypoxia ([place]) 04/11/2022   Acute osteomyelitis of ankle or foot, left ([place]) 01/31/2018   Acute respiratory failure with hypoxia and hypercarbia ([place]) 06/15/2020   Alveolar hypoventilation    Amputation stump infection ([place]) 04/09/2018   Anemia    not on iron pill   Asthma    Bipolar 2 disorder ([name])    Candidiasis of vagina 05/15/2022   Carpal tunnel syndrome on right    recurrent   Cellulitis 08/2010-08/2011   Chest pain with low risk for cardiac etiology 05/11/2021   Chronic pain    COPD (chronic obstructive pulmonary disease) ([name])    Symbicort daily and Proventil as needed   Costochondritis    De Quervain's tenosynovitis, bilateral 11/01/2015   Depression    Diabetes mellitus type II, uncontrolled 2000   Type 2, Uncontrolled.Takes Lantus daily.Fasting blood sugar runs 150   Diabetic ulcer of right foot ([place]) 12/19/2021   Drug-seeking behavior    Elevated troponin    Exposure to mold 04/21/2020   GERD (gastroesophageal reflux disease)    takes Pantoprazole and Zantac daily   HLD (hyperlipidemia)    takes Atorvastatin daily   Hypertension    takes Lisinopril and Coreg daily   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[place]) 04/11/2019   Left shoulder pain 06/23/2019   Morbid obesity ([place])    Morbid obesity with BMI of 60.0-69.9, adult ([place]) 04/15/2021   Nocturia    Nonobstructive atherosclerosis of coronary artery 11/26/2020   11/26/20 Coronary angiogram: 1.  Mild diffuse  proximal LAD stenosis with no evidence of obstructive disease (20 to 30% diffuse stenosis);  2.  Left dominant circumflex with widely patent obtuse marginal branches and left PDA branch, no significant stenoses present  3.  Nondominant RCA with mild diffuse nonobstructive stenosis    OSA on CPAP    Peripheral neuropathy    takes Gabapentin daily   Pneumonia    "walking" several yrs ago and as a baby (12/05/2018)   Pneumonia due to COVID-19 virus 04/14/2021   Primary osteoarthritis of first carpometacarpal joint of left hand 07/30/2016   Rectal fissure    Restless leg    Right carpal tunnel syndrome 09/01/2011   SVT (supraventricular tachycardia) ([place])    Syncope 02/25/2016   Thrombocytosis 04/11/2022   Urinary frequency    Urinary incontinence 10/23/2020   UTI (urinary tract infection) 04/15/2021   Varicose veins    Right medial thigh and Left leg    Past Surgical History:  Past Surgical History:  Procedure Laterality Date   AMPUTATION Left 02/01/2018   Procedure: LEFT FOURTH AND 5TH TOE RAY AMPUTATION;  Surgeon: [name], MD;  Location: [place];  Service: Orthopedics;  Laterality: Left;   AMPUTATION Left 03/03/2018   Procedure: LEFT BELOW KNEE AMPUTATION;  Surgeon: [name], MD;  Location: [place];  Service: Orthopedics;  Laterality: Left;   AMPUTATION Right 05/21/2022   Procedure: REVISION AMPUTATION RAY 1ST;  Surgeon: [name], DPM;  Location: [place];  Service: Podiatry;  Laterality: Right;   CARPAL TUNNEL RELEASE Bilateral    CESAREAN SECTION  2007   CORONARY ANGIOGRAPHY N/A 11/26/2020   Procedure: CORONARY ANGIOGRAPHY;  Surgeon: Sherren Mocha, MD;  Location: [place] CV LAB;  Service: Cardiovascular;  Laterality: N/A;   FOOT AMPUTATION Right    INCISION AND DRAINAGE ABSCESS Left 10/16/2021   Procedure: INCISION AND DRAINAGE ABSCESS;  Surgeon: Dwan Bolt, MD;  Location: [place];  Service: General;  Laterality: Left;   INCISION AND DRAINAGE PERIRECTAL ABSCESS Left  05/18/2019   Procedure: IRRIGATION AND DEBRIDEMENT OF PANNIS ABSCESS, POSSIBLE DEBRIDEMENT OF BUTTOCK WOUND;  Surgeon: Donnie Mesa, MD;  Location: [place];  Service: General;  Laterality: Left;   IRRIGATION AND DEBRIDEMENT BUTTOCKS Left 05/17/2019   Procedure: IRRIGATION AND DEBRIDEMENT BUTTOCKS;  Surgeon: Donnie Mesa, MD;  Location: [place];  Service: General;  Laterality: Left;   KNEE ARTHROSCOPY Right 07/17/2010   LEFT HEART CATHETERIZATION WITH CORONARY ANGIOGRAM N/A 07/27/2012   Procedure: LEFT HEART CATHETERIZATION WITH CORONARY ANGIOGRAM;  Surgeon: Sherren Mocha, MD;  Location: [facility] CATH LAB;  Service: Cardiovascular;  Laterality: N/A;   MASS EXCISION N/A 06/29/2013   Procedure:  WIDE LOCAL EXCISION OF POSTERIOR NECK ABSCESS;  Surgeon: Ralene Ok, MD;  Location: [place];  Service: General;  Laterality: N/A;   REPAIR KNEE LIGAMENT Left    "fixed ligaments and chipped patella"   right transmetatarsal amputation      TRACHEOSTOMY TUBE PLACEMENT N/A 07/10/2022   Procedure: CONTROLLED EXTUBATION;  Surgeon: Izora Gala, MD;  Location: [place];  Service: ENT;  Laterality: N/A;   HPI: Pt [facility] ED [facility] as a transfer from [facility] ED for further evaluation of progressively worsening throat pain and dysphagia. 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 PMHx significant for HTN, HLD, acute-on-chronic CHF, CAD, COPD/asthma, OSA on CPAP, T2DM (c/b diabetic peripheral neuropathy and bilateral BKAs), GERD, bipolar disorder. CT Neck 7/31 demonstrated edema and inflammatory stranding around the left submandibular gland, consistent with acute left submandibular sialoadenitis.  Edema and inflammatory stranding were also noted to extend into the ventral upper neck with foci of gas within the left submandibular gland, possibly secondary to infection with gas-producing organism and associated pharyngitis.  While awaiting floor bed, patient decompensated from an airway protection standpoint, became stridorous and PCCM was  consulted for admission.  On arrival to evaluate patient, anesthesia was preparing to intubate. Intubation was successful on second attempt.  Dr. Constance Holster with ENT was present at bedside during intubation, noting excessive purulent secretions but no significant swelling of the supraglottic larynx recommendation for conservative management with IV antibiotics.   Subjective: alert, at bedside with family present    Recommendations for follow up therapy are one component of a multi-disciplinary discharge planning process, led by the attending physician.  Recommendations may be updated based on patient status, additional functional criteria and insurance authorization.  Assessment / Plan / Recommendation     07/12/2022   12:00 PM  Clinical Impressions  Clinical Impression Pt demonstrates no signficant oropharyngeal dysphagia. Vocal quality only minimally hoarse Pt does have mild bilateral excresence on vocal folds consistent with 6 day intubation and also some bulky tissue lateral to base of tongue, though no overt wound noticed. Large NG tube also present which leads to mild residue clinging around tube with purees and solids. Pt achieve adequate airway protection throughout. She may have occasional cough if swallowing liquids but airway protection appears adequate. Recommend pt resume a regular diet and thin liquids with  NG tube removed. Called FMTS to report recommendation and discussed with RN.  SLP Visit Diagnosis Dysphagia, unspecified (R13.10)  Impact on safety and function Mild aspiration risk         07/12/2022   12:00 PM  Treatment Recommendations  Treatment Recommendations No treatment recommended at this time        07/10/2022   12:00 PM  Prognosis  Prognosis for Safe Diet Advancement Good  Barriers to Reach Goals Time post onset;Severity of deficits       07/12/2022   12:00 PM  Diet Recommendations  SLP Diet Recommendations Regular solids;Thin liquid  Liquid Administration via  Cup;Straw  Medication Administration Whole meds with liquid  Postural Changes Seated upright at 90 degrees         07/12/2022   12:00 PM  Other Recommendations  Follow Up Recommendations No SLP follow up       07/10/2022   12:00 PM  Frequency and Duration   Speech Therapy Frequency (ACUTE ONLY) min 2x/week  Treatment Duration 2 weeks         07/12/2022   12:00 PM  Oral Phase  Oral Phase [facility]       07/12/2022   12:00 PM  Pharyngeal Phase  Pharyngeal Phase WFL         No data to display           Geza Beranek, Katherene Ponto 07/12/2022, 1:02 PM

## 2022-07-12 NOTE — Assessment & Plan Note (Signed)
Hx CKD3. Improving, Cr downtrending, GFR uptrending. Continues to be hypernatremic - Follow BMP - trend NA -f/u with nephrology in 6 weeks

## 2022-07-12 NOTE — Progress Notes (Signed)
OT Cancellation Note  Patient Details Name: [name] MRN: [id number] DOB: 21-Jun-1976   Cancelled Treatment:    Reason Eval/Treat Not Completed: Other (comment). Came back earlier and pt was just starting to eat. Back to attempt to see patient and x-ray transport is here to get her. [place], OTR/L Acute Re[facility] [phone] Office [phone]    Almon Register 07/12/2022, 1:52 PM

## 2022-07-12 NOTE — Progress Notes (Signed)
PT Cancellation Note  Patient Details Name: [name] MRN: [id number] DOB: [birth year]   Cancelled Treatment:    Reason Eval/Treat Not Completed: Patient at procedure or test/unavailable Attempted 4 x today (pt having FEES, going for xray, eating, going for another test).  Will f/u as able.  Abran Richard, PT Acute Rehab [facility] [phone]   Karlton Lemon 07/12/2022, 2:05 PM

## 2022-07-12 NOTE — Assessment & Plan Note (Addendum)
Improving. Was poorly controlled in the 200s to 300s, now in the high 100s. On home duloxetine, gabapentin (1200 BID) for diabetic peripheral neuropathy.  - RESTARTED home duloxetine - continue gabapentin 600 mg BID, hold home dose - continue SSI + Levemir, novolog

## 2022-07-12 NOTE — Assessment & Plan Note (Addendum)
Nausea, constipation. Stool twice yesterday (8/7). On carb-controlled renal diet - continue carb-controlled renal diet - PRN zofran for nausea - continue BID senna, miralax - START 1 time glycerin suppository

## 2022-07-12 NOTE — Progress Notes (Signed)
[facility] Progress Note Patient Name: [name] DOB: [birth year] MRN: [id number]   Date of Service  07/12/2022  HPI/Events of Note  Pt started on TF @ 20/hr on SSI Q4 hrs (+) 9 u over SSI. Last CBG 126, RN concerned 10 u is too much  eICU Interventions  - to give 5 units SQ for now. Follow SSI.   Discussed with RN.     Intervention Category Intermediate Interventions: Other: (Hypoglycemia)  Elmer Sow 07/12/2022, 12:25 AM

## 2022-07-12 NOTE — Progress Notes (Addendum)
Daily Progress Note Intern Pager: [phone]  Patient name: [name] Medical record number: [id number] Date of birth: 1976/10/30 Age: 46 y.o. Gender: female  Primary Care Provider: [name], MD Consultants: ENT, pharmacy, respiratory therapy, speech Code Status: FULL  Pt Overview and Major Events to Date:  -7/28 - Underwent eye surgery at [facility] -7/31 Presented to [facility] ED via EMS for progressively worsening throat pain and associated dysphagia, ENT consulted and recommended transfer to [facility] ED for nasopharyngeal scope.While awaiting bed became stridorous with concern for airway compromise and anesthesia called to intubate. Intubated in ED. PCCM consulted for admission and escalation of care. -8/1 transitioned abx to Unasyn, MAP 50s > transitioned to Precedex and Fentanyl with Levophed if needed, development of AKI -8/2 febrile to 102.1, start tube feeds -8/3 nephrology consulted for worsening kidney function -8/5 extubated in OR -8/7 transferred back to floor  Assessment and Plan:  46 year old woman presented with  with submandibular abscess with pharyngeal abscess  requiring intubation and ICU admission. Hospital course complicated by acute kidney injury and dysphagia. 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 Admit to ICU. Re-intubated and stabilized, now extubated. Follow up CT stable. ENT, pharmacy following. WBC downtrending. - Re-dmit to FPTS, Progressive, Attending Dr. Owens Shark - ENT following, appreciate recs - Monitor airway and resp status - NPO, hold home medications  - Continue ampicillin-sulbactam (Unasyn) 3g IV (scheduled for 13 doses) - continue Unasyn until 8/9 (for total of 10 days) - PT/OT Eval and Treat  - f/u FEES  study   Hypernatremia Worsening Na, uptrending. Possibly related to renal water loss: resolving ATN (post-ATN auto-diuresis), nephrogenic diabetes insipidus (decreased kidney response to ADH), or extrarenal losses: vomiting, increased hypertonic intake. Taking free water - trend Na - consider testing urine Osm to check for DI - consider adjusting feeding - free water increased from 100 mL to 200 mL  Acute tubular necrosis ([name]) Hx CKD3. Improving, Cr downtrending, GFR uptrending. Continues to be hypernatremic - Follow BMP - trend NA -f/u with nephrology in 6 weeks  Acute respiratory failure with hypoxia [facility]) Patient became stridorous while waiting for floor bed and required intubation (successful on second attempt). Placed on MV in ICU, now extubated but continues to require oxygen. On scheduled and PRN nebulizers, oxygen via nasal cannula. Sleeps with BIPAP, held per RT due to NGT. Re-check CXR to assess continued O2 needs - hold BIPAP QHS pending NGT removal - continue oxygen via nasal cannula, wean as tolerated - close monitoring of continuous pulse oximetry - continue arformoterol, duoneb - continue albuterol PRN - f/u CXR  Hypermagnesemia Mg stably high (2.7). Rarely seen, though not uncommon in CKD - trend Mg - give calcium gluconate IV to antagonize Mg?  Diastolic HF (heart failure) ([name]) Chronic, stable. Hx CAD, HTN, HLD. - RESUMED home amlodipine - Monitor strict I&Os - continue aspirin 81 mg - hold diuretics - f/u CXR  Type 2 diabetes mellitus with diabetic neuropathy, unspecified ([place]) Improving. Was poorly controlled in the 200s to 300s, now in the high 100s. On home duloxetine, gabapentin for diabetic peripheral neuropathy - hold duloxetine (unable to crush) - continue gabapentin 600 mg - continue SSI + Levemir, novolog - consider adjusting insulin s/p NGT d/c  Status post eye surgery Unknown eye  surgery prior to procedure. On multiple eye drops - continue  eye drops - follow-up with ophthalmology after d/c  Chest pain Patient mentioned chest pain in the ED, resolved spontaneously. Currently hemodynamically stable. EKG 8/5 shows age indeterminate lateral infarct - new compared to previous EKG on 7/31. - continue to monitor   Gastrointestinal discomfort Nausea, constipation. NPO - continue NPO - PRN zofran for nausea - continue BID senna, miralax - START 1 time glycerin suppository   Right foot amputation Stitches removed by podiatry 8/4. - F/u as outpatient 2 weeks after discharge (Dr. Posey Pronto at [facility]).   Bipolar 2 disorder - Supportive care  FEN/GI: NPO, Senna BID, PEG BID, docusate BID PPx: heparin Dispo:Pending PT recommendations  pending clinical improvement .  Subjective:  Slept well. Not complaining of pain. Eager to eat - asked about SLP eval  Objective: Temp:  [96.2 F (35.7 C)-97.7 F (36.5 C)] 97.7 F (36.5 C) (08/07 0810) Pulse Rate:  [76-95] 81 (08/07 0426) Resp:  [1-20] 20 (08/07 0810) BP: (102-151)/(47-95) 102/47 (08/07 0810) SpO2:  [94 %-100 %] 99 % (08/07 0810) Weight:  [313 lb 0.9 oz (142 kg)] 313 lb 0.9 oz (142 kg) (08/07 0426) Physical Exam: General: in no acute distress HEENT: normocephalic and atraumatic Cardiovascular: regular rate and distant heart sounds Respiratory: CTAB anteriorly, normal respiratory effort, on RA, and diminished throughout Gastrointestinal: non-tender, non-distended, and no rebound tenderness or guarding Extremities: pulses present in bilateral UE, amputated L leg, amputated R foot, looks clean and dry with palpable pulse Neuro: following commands and no focal neurological deficits  Laboratory: Most recent CBC Lab Results  Component Value Date   WBC 11.6 (H) 07/12/2022   HGB 11.5 (L) 07/12/2022   HCT 37.2 07/12/2022   MCV 94.9 07/12/2022   PLT 519 (H) 07/12/2022    Most recent BMP    Latest Ref Rng & Units 07/12/2022   12:46 AM  BMP  Glucose 70 - 99  mg/dL 143   BUN 6 - 20 mg/dL 49   Creatinine 0.44 - 1.00 mg/dL 1.80   Sodium 135 - 145 mmol/L 149   Potassium 3.5 - 5.1 mmol/L 4.1   Chloride 98 - 111 mmol/L 107   CO2 22 - 32 mmol/L 31   Calcium 8.9 - 10.3 mg/dL 9.6    Other pertinent labs   Mg 2.7 stable Platelets 519k up from 447k GFR 35 up from 22  Camelia Phenes, MD 07/12/2022, 12:40 PM  PGY-1, [place] Intern pager: [phone], text pages welcome Secure chat group [place]

## 2022-07-12 NOTE — Progress Notes (Signed)
Inpatient Diabetes Program Recommendations  AACE/ADA: New Consensus Statement on Inpatient Glycemic Control (2015)  Target Ranges:  Prepandial:   less than 140 mg/dL      Peak postprandial:   less than 180 mg/dL (1-2 hours)      Critically ill patients:  140 - 180 mg/dL   Lab Results  Component Value Date   GLUCAP 171 (H) 07/12/2022   HGBA1C 14.9 (H) 05/16/2022    Review of Glycemic Control  Latest Reference Range & Units 07/11/22 12:20 07/11/22 15:59 07/11/22 19:57 07/11/22 23:53 07/12/22 04:24  Glucose-Capillary 70 - 99 mg/dL 202 (H) 205 (H) 159 (H) 126 (H) 171 (H)   Diabetes history: DM 2 Outpatient Diabetes medications:  Dexcom sensor Novolog 20 units tid with meals Tresiba 52 units bid Current orders for Inpatient glycemic control:  Novolog 1-3 units q 4 hours Novolog 9 units q 4 hours Levemir 26 units bid Nepro 20 ml/hr Inpatient Diabetes Program Recommendations:    Consider reducing Novolog tube feed coverage to 5 units q 4 hours.  Thanks,  Adah Perl, RN, BC-ADM Inpatient Diabetes Coordinator Pager [phone]  (8a-5p)

## 2022-07-12 NOTE — Assessment & Plan Note (Addendum)
Resolved. Mg 2.2 (8/8) - CTM Mg

## 2022-07-12 NOTE — Assessment & Plan Note (Addendum)
Chronic, stable. Hx CAD, HTN, HLD. - RESUMED home amlodipine - Monitor strict I&Os - continue aspirin 81 mg - hold diuretics - f/u CXR

## 2022-07-12 NOTE — Plan of Care (Signed)

## 2022-07-12 NOTE — Assessment & Plan Note (Signed)
Unknown eye surgery prior to procedure. On multiple eye drops - continue eye drops - follow-up with ophthalmology after d/c

## 2022-07-13 DIAGNOSIS — E1140 Type 2 diabetes mellitus with diabetic neuropathy, unspecified: Secondary | ICD-10-CM

## 2022-07-13 DIAGNOSIS — I5030 Unspecified diastolic (congestive) heart failure: Secondary | ICD-10-CM

## 2022-07-13 DIAGNOSIS — M25561 Pain in right knee: Secondary | ICD-10-CM | POA: Diagnosis not present

## 2022-07-13 DIAGNOSIS — K122 Cellulitis and abscess of mouth: Secondary | ICD-10-CM | POA: Diagnosis not present

## 2022-07-13 DIAGNOSIS — M25562 Pain in left knee: Secondary | ICD-10-CM

## 2022-07-13 DIAGNOSIS — Z794 Long term (current) use of insulin: Secondary | ICD-10-CM

## 2022-07-13 DIAGNOSIS — G8929 Other chronic pain: Secondary | ICD-10-CM

## 2022-07-13 DIAGNOSIS — J391 Other abscess of pharynx: Secondary | ICD-10-CM | POA: Diagnosis not present

## 2022-07-13 LAB — BASIC METABOLIC PANEL
Anion gap: 12 (ref 5–15)
BUN: 53 mg/dL — ABNORMAL HIGH (ref 6–20)
CO2: 26 mmol/L (ref 22–32)
Calcium: 9.3 mg/dL (ref 8.9–10.3)
Chloride: 103 mmol/L (ref 98–111)
Creatinine, Ser: 1.72 mg/dL — ABNORMAL HIGH (ref 0.44–1.00)
GFR, Estimated: 37 mL/min — ABNORMAL LOW (ref >60)
Glucose, Bld: 193 mg/dL — ABNORMAL HIGH (ref 70–99)
Potassium: 3.2 mmol/L — ABNORMAL LOW (ref 3.5–5.1)
Sodium: 141 mmol/L (ref 135–145)

## 2022-07-13 LAB — CBC
HCT: 38.3 % (ref 36.0–46.0)
Hemoglobin: 11.7 g/dL — ABNORMAL LOW (ref 12.0–15.0)
MCH: 28.9 pg (ref 26.0–34.0)
MCHC: 30.5 g/dL (ref 30.0–36.0)
MCV: 94.6 fL (ref 80.0–100.0)
Platelets: 512 10*3/uL — ABNORMAL HIGH (ref 150–400)
RBC: 4.05 MIL/uL (ref 3.87–5.11)
RDW: 14.6 % (ref 11.5–15.5)
WBC: 13.9 10*3/uL — ABNORMAL HIGH (ref 4.0–10.5)
nRBC: 0 % (ref 0.0–0.2)

## 2022-07-13 LAB — MAGNESIUM: Magnesium: 2.2 mg/dL (ref 1.7–2.4)

## 2022-07-13 LAB — GLUCOSE, CAPILLARY
Glucose-Capillary: 150 mg/dL — ABNORMAL HIGH (ref 70–99)
Glucose-Capillary: 161 mg/dL — ABNORMAL HIGH (ref 70–99)
Glucose-Capillary: 201 mg/dL — ABNORMAL HIGH (ref 70–99)
Glucose-Capillary: 250 mg/dL — ABNORMAL HIGH (ref 70–99)

## 2022-07-13 LAB — PHOSPHORUS: Phosphorus: 4.2 mg/dL (ref 2.5–4.6)

## 2022-07-13 MED ORDER — DULOXETINE HCL 60 MG PO CPEP
60.0000 mg | ORAL_CAPSULE | Freq: Every day | ORAL | Status: DC
  Administered 2022-07-13: 60 mg via ORAL
  Filled 2022-07-13: qty 1

## 2022-07-13 MED ORDER — POTASSIUM CHLORIDE 20 MEQ PO PACK
40.0000 meq | PACK | Freq: Two times a day (BID) | ORAL | Status: DC
Start: 2022-07-13 — End: 2022-07-14
  Administered 2022-07-13: 40 meq via ORAL
  Filled 2022-07-13: qty 2

## 2022-07-13 MED ORDER — OXYCODONE-ACETAMINOPHEN 7.5-325 MG PO TABS
1.0000 | ORAL_TABLET | Freq: Four times a day (QID) | ORAL | Status: DC | PRN
  Administered 2022-07-13: 1 via ORAL
  Filled 2022-07-13 (×2): qty 1

## 2022-07-13 MED ORDER — GLUCERNA SHAKE PO LIQD
237.0000 mL | Freq: Three times a day (TID) | ORAL | Status: DC
  Administered 2022-07-13: 237 mL via ORAL

## 2022-07-13 NOTE — Progress Notes (Signed)
Received page from RN that patient had packed up her bags and is leaving AMA, already signed [place] paperwork.  Dr. Joeseph Amor and I proceeded to patient's room to discuss.  She was found in the hallway of the hospital outside of the unit going towards the main entrance in her power wheelchair.  Patient stated that she was "sick of being trapped in her room" and wanted to go home.  She reported that she understood the risks of leaving the hospital AMA and not completing her antibiotic course.  Patient stated "I will be fine."  Patient was suspected to be using cocaine earlier in the day, see chart for further details.  Urine drug screen has been ordered but had not been collected.  Patient demonstrates capacity to leave AMA.  8:31 PM 07/13/22

## 2022-07-13 NOTE — Progress Notes (Signed)
Orthopedic Tech Progress Note Patient Details:  Miguelina Fore [birth year] [id number]  PT called requesting a POST OP SHOE for patient   Ortho Devices Type of Ortho Device: Postop shoe/boot Ortho Device/Splint Location: RLE Ortho Device/Splint Interventions: Ordered, Application, Adjustment   Post Interventions Patient Tolerated: Well Instructions Provided: Care of device  Janit Pagan 07/13/2022, 3:50 PM

## 2022-07-13 NOTE — Evaluation (Signed)
Occupational Therapy Evaluation Patient Details Name: [name] MRN: [id number] DOB: [birth year] Today's Date: 07/13/2022   History of Present Illness Pt is 46 yo female admitted on 07/05/22 with submandibular abscess with pharyngeal abscess requiring intubation.  ETT 07/05/22 - 07/10/22.  Pt had recent eye surgery 07/02/22 at [facility].  Pt also with R revisional partial first ray amputation at recent admission in 05/21/22- podiatry saw pt this hospitalization and removed stiches, recommending WBAT in surgical shoe.  Pt with hx including but not limited to CHF, COPD, DM2, HLD, HTN, CAD, OSA, L BKA   Clinical Impression   Patient admitted for above and presents with problem list below. She reports PTA using sliding board with setup assist from daughter for transfers, assist from aide/daughter for ADLs (mostly LB).  She is sitting EOB upon entry, demonstrates ability to complete UB ADLs with setup assist, LB ADLs with max assist; scooting at side of bed with min guard to supervision.  Pt eager to stand with surgical off loading shoe, but reports she does not have one at home (her family is bringing her power chair); notified RN to get this ordered.  Based on performance today, believe pt is near baseline level for ADLs/mobility but with new ability to weight bear with shoe on R LE will follow acutely and recommend HHOT services at dc to optimize independence, safety and decrease burden of care.       Recommendations for follow up therapy are one component of a multi-disciplinary discharge planning process, led by the attending physician.  Recommendations may be updated based on patient status, additional functional criteria and insurance authorization.   Follow Up Recommendations  Home health OT    Assistance Recommended at Discharge Frequent or constant Supervision/Assistance  Patient can return home with the following A little help with walking and/or transfers;A lot of help with  bathing/dressing/bathroom;Assist for transportation;Help with stairs or ramp for entrance    Functional Status Assessment  Patient has had a recent decline in their functional status and demonstrates the ability to make significant improvements in function in a reasonable and predictable amount of time.  Equipment Recommendations  None recommended by OT    Recommendations for Other Services       Precautions / Restrictions Precautions Precautions: Fall Required Braces or Orthoses: Other Brace Other Brace: R surgical shoe Restrictions Weight Bearing Restrictions: Yes RLE Weight Bearing: Weight bearing as tolerated Other Position/Activity Restrictions: in surgical shoe      Mobility Bed Mobility               General bed mobility comments: EOB upon entry    Transfers                          Balance Overall balance assessment: Needs assistance Sitting-balance support: No upper extremity supported Sitting balance-Leahy Scale: Good                                     ADL either performed or assessed with clinical judgement   ADL Overall ADL's : Needs assistance/impaired     Grooming: Set up;Sitting           Upper Body Dressing : Set up;Sitting   Lower Body Dressing: Sitting/lateral leans;Maximal assistance Lower Body Dressing Details (indicate cue type and reason): pt assists with donning L prostheic, overall requires max assist for LB dressing at  edge of bed.  pt reports aide typically assists her.   Toilet Transfer Details (indicate cue type and reason): deferred, pt preferece to stay eob.  pt able to scoot laterally on eob with min guard                 Vision   Vision Assessment?: No apparent visual deficits     Perception     Praxis      Pertinent Vitals/Pain Pain Assessment Pain Assessment: 0-10 Pain Score: 8  Pain Location: back Pain Descriptors / Indicators: Discomfort, Sore Pain Intervention(s): Limited  activity within patient's tolerance, Monitored during session, Repositioned     Hand Dominance Right   Extremity/Trunk Assessment Upper Extremity Assessment Upper Extremity Assessment: Generalized weakness   Lower Extremity Assessment Lower Extremity Assessment: Defer to PT evaluation   Cervical / Trunk Assessment Cervical / Trunk Assessment: Other exceptions Cervical / Trunk Exceptions: increased body habitus   Communication Communication Communication: No difficulties   Cognition Arousal/Alertness: Awake/alert Behavior During Therapy: WFL for tasks assessed/performed Overall Cognitive Status: Within Functional Limits for tasks assessed                                       General Comments       Exercises     Shoulder Instructions      Home Living Family/patient expects to be discharged to:: Private residence Living Arrangements: Children (daughter and roommate) Available Help at Discharge: Family;Personal care attendant;Available 24 hours/day;Friend(s) (CAP aide m-f 8 hrs) Type of Home: Apartment Home Access: Level entry     Home Layout: One level     Bathroom Shower/Tub: Tub/shower unit;Sponge bathes at baseline (sponge bathes, would like to return to showers)   Bathroom Toilet: Standard     Home Equipment: Conservation officer, nature (2 wheels);Grab bars - tub/shower;Tub bench;Wheelchair - manual;Wheelchair - power (drop arm BSC)   Additional Comments: LLE prosthesis; offloading shoe for R      Prior Functioning/Environment Prior Level of Function : Needs assist       Physical Assist : Mobility (physical);ADLs (physical)     Mobility Comments: Since June using sliding board due to R LE transmet; daughter plces the board; power chair around the house; prior to that RW for short trips ADLs Comments: daughter and aide (5 days x8 hrs/day) assist with ADLs/IADLs, uses transportation services for getting to dr appts        OT Problem List:  Decreased strength;Decreased activity tolerance;Impaired balance (sitting and/or standing);Pain;Obesity;Decreased knowledge of precautions;Decreased knowledge of use of DME or AE      OT Treatment/Interventions: Self-care/ADL training;Therapeutic exercise;DME and/or AE instruction;Therapeutic activities;Balance training;Patient/family education    OT Goals(Current goals can be found in the care plan section) Acute Rehab OT Goals Patient Stated Goal: to walk OT Goal Formulation: With patient Time For Goal Achievement: 07/27/22 Potential to Achieve Goals: Good  OT Frequency: Min 2X/week    Co-evaluation              AM-PAC OT "6 Clicks" Daily Activity     Outcome Measure Help from another person eating meals?: None Help from another person taking care of personal grooming?: A Little Help from another person toileting, which includes using toliet, bedpan, or urinal?: A Lot Help from another person bathing (including washing, rinsing, drying)?: A Lot Help from another person to put on and taking off regular upper body clothing?: A Little  Help from another person to put on and taking off regular lower body clothing?: A Lot 6 Click Score: 16   End of Session Nurse Communication: Mobility status;Other (comment) (ordering surgical shoe)  Activity Tolerance: Patient tolerated treatment well Patient left: with call bell/phone within reach;Other (comment) (seated EOB)  OT Visit Diagnosis: Muscle weakness (generalized) (M62.81);Pain;Other abnormalities of gait and mobility (R26.89) Pain - part of body:  (back)                Time: 5248-1859 OT Time Calculation (min): 16 min Charges:  OT General Charges $OT Visit: 1 Visit OT Evaluation $OT Eval Moderate Complexity: 1 Mod  Jolaine Artist, OT Acute Rehabilitation Services Office [phone]   Delight Stare 07/13/2022, 9:43 AM

## 2022-07-13 NOTE — Discharge Summary (Signed)
[facility] Summary  Patient name: [name] Medical record number: [id number] Date of birth: [birth year] Age: 46 y.o. Gender: female Date of Admission: 07/05/2022  Date left AMA: 07/13/2022  Admitting Physician: Jacky Kindle, MD  Primary Care Provider: [name], MD Consultants: ENT, critical care  Indication for Hospitalization: pharyngeal infection  Brief Hospital Course:  [name] is a 46 year old woman presented with  with submandibular sialoadenitis and pharyngeal abscess requiring intubation and ICU admission. Hospital course complicated by acute kidney injury and dysphagia. 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 CT neck consistent with left submandibular sialoadenitis and evidence of infection caused by gas-producing organism. Started on empiric vancomycin and cefepime. ENT consulted. Later that day, acutely decompensated from airway protection standpoint requiring intubation and ICU admission. Antibiotics transitioned to ampicillin-sulbactam, no clear indication for surgical intervention per ENT. Repeat CT stable without progression. Ultimately was successfully extubated on 8/5 in the OR (due to difficult airway, in case urgent tracheostomy was needed). She had dysphagia following extubation, briefly required NG tube. Transferred to FMTS on 8/7. FEES study performed by SLP, no significant dysphagia so NGT removed and diet resumed. Unfortunately, patient left AMA and only completed 8/10 day planned course of IV antibiotics.   Acute tubular necrosis History of CKD3. Also complicated by hypernatremia. Nephrology was consulted due to rising Cr and possible need for short-term dialysis, felt to be  secondary to ATN. Torsemide held. Hypernatremia treated with free water. She started showing signs of recovery with decreasing Cr and increasing UOP, ultimately did not require dialysis. Most recent Cr 1.72.   Diastolic HF (heart failure) Chronic, stable. Euvolemic on exam. CXR 8/7 shows no fluid overload. Diuretics held given AKI.    Type 2 diabetes mellitus with diabetic neuropathy, unspecified Was poorly controlled initially in the 200s to 300s, basal + bolus insulin regimen was titrated. Gabapentin dose reduced.   Right foot amputation Stitches removed by podiatry 8/4. No sign of infection. Plan for follow-up as outpatient 2 weeks after discharge (Dr. Posey Pronto at [place]).   Bipolar 2 disorder Present on admission. Treatment naive per patient. Consider referral to psychiatry / psychotherapy  Patient left AMA, deemed to have capacity. See chart for further details. Patient was suspected to be using cocaine hours before leaving, UDS had been ordered but not collected.  Issues for follow up:  Check CBC and metabolic panel Optimize insulin regimen F/u with nephrology in 6 weeks F/u podiatry 2 weeks F/u ophthalmology after d/c Consider referral to psychiatry, psychotherapy for mood disorder  Diagnoses/Problem List:  Principal Problem:   Pharyngeal abscess Active Problems:   Insulin dependent type 2 diabetes mellitus ([name])   Acute respiratory failure with hypoxia ([name])   Acute tubular necrosis ([name])   Hypernatremia   Hypermagnesemia   Diastolic HF (heart failure) ([name])   Type 2 diabetes mellitus with diabetic neuropathy, unspecified ([place])   Ludwig's angina   Status post eye surgery   Chest pain   Gastrointestinal discomfort     Disposition: home  Condition: stable  Discharge Exam:  Vitals:   07/13/22 1735 07/13/22 1900  BP: 117/72 (!) 146/89  Pulse: 97 95  Resp: 15 (!) 22  Temp: 97.9 F (36.6 C) 97.8 F (36.6 C)  SpO2:  Exam per Dr. Edd Arbour General:  well-appearing and in no acute distress HEENT: normocephalic and atraumatic Cardiovascular: regular rate and distant heart sounds Respiratory: CTAB posteriorly, normal respiratory effort, on RA, and decreased throughout Gastrointestinal: non-tender and non-distended Extremities:  amputated L leg, partially amputated R foot Neuro: following commands and no focal neurological deficits  Significant Procedures:  7/31 - intubated in the ED 8/5 - extubated in the OR 8/7 - FEES  Significant Labs and Imaging:  Recent Labs  Lab 07/12/22 0046 07/13/22 0107  WBC 11.6* 13.9*  HGB 11.5* 11.7*  HCT 37.2 38.3  PLT 519* 512*   Recent Labs  Lab 07/12/22 0046 07/13/22 0107  NA 149* 141  K 4.1 3.2*  CL 107 103  CO2 31 26  GLUCOSE 143* 193*  BUN 49* 53*  CREATININE 1.80* 1.72*  CALCIUM 9.6 9.3  MG 2.7* 2.2  PHOS 3.8 4.2    IMAGING: CT SOFT TISSUE NECK (7/31) IMPRESSION: Edema and inflammatory stranding surrounding the left submandibular gland consistent with acute left submandibular sialoadenitis. Edema and inflammatory stranding also extend into the ventral upper neck. There are foci of gas within the left submandibular gland, which may be secondary to infection with a gas-producing organism, or potentially related to recent positive pressure ventilation.   Mucosal/submucosal edema within the left oropharynx, suggesting pharyngitis.   Left-sided cervical lymphadenopathy, as described and likely reactive.  CT SOFT TISSUE NECK (8/3)  IMPRESSION: Unchanged supraglottitis and left submandibular cellulitis with soft tissue gas in the left submandibular space.  US RENAL (8/3) IMPRESSION: Study is limited due to large body habitus per the technologist's note. Unremarkable ultrasound imaging of the kidneys. There has been no significant interval change.  ABD XR (8/6) IMPRESSION: 1. No evidence of bowel obstruction. 2. Relative paucity of bowel gas. Limited study due to the  patient's body habitus.  CHEST XR (8/7) IMPRESSION: Chronically elevated right hemidiaphragm with adjacent subsegmental atelectasis. No new airspace disease.  Results/Tests Pending at Time left AMA: none  Medications:  Patient left AMA, unable to reconcile medications  Follow-Up Appointments:   Zola Button, MD 07/13/2022, 11:31 PM PGY-3, [place]

## 2022-07-13 NOTE — Discharge Instructions (Signed)
Plate Method for Diabetes   Foods with carbohydrates make your blood glucose level go up. The plate method is a simple way to meal plan and control the amount of carbohydrate you eat.         Use the following guidance to build a healthy plate to control carbohydrates. Divide a 9-inch plate into 3 sections, and consider your beverage the 4th section of your meal: Food Group Examples of Foods/Beverages for This Section of your Meal  Section 1: Non-starchy vegetables Fill  of your plate to include non-starchy vegetables Asparagus, broccoli, brussels sprouts, cabbage, carrots, cauliflower, celery, cucumber, green beans, mushrooms, peppers, salad greens, tomatoes, or zucchini.  Section 2: Protein foods Fill  of your plate to include a lean protein Lean meat, poultry, fish, seafood, cheese, eggs, lean deli meat, tofu, beans, lentils, nuts or nut butters.  Section 3: Carbohydrate foods Fill  of your plate to include carbohydrate foods Whole grains, whole wheat bread, brown rice, whole grain pasta, polenta, corn tortillas, fruit, or starchy vegetables (potatoes, green peas, corn, beans, acorn squash, and butternut squash). One cup of milk also counts as a food that contains carbohydrate.  Section 4: Beverage Choose water or a low-calorie drink for your beverage. Unsweetened tea, coffee, or flavored/sparkling water without added sugar.  Image reprinted with permission from The American Diabetes Association.  Copyright 2022 by the American Diabetes Association.   Copyright 2022  Academy of Nutrition and Dietetics. All rights reserved       Heart Healthy, Consistent Carbohydrate Nutrition Therapy   A heart-healthy and consistent carbohydrate diet is recommended to manage heart disease and diabetes. To follow a heart-healthy and consistent carbohydrate diet, Eat a balanced diet with whole grains, fruits and vegetables, and lean protein sources.  Choose heart-healthy unsaturated fats. Limit  saturated fats, trans fats, and cholesterol intake. Eat more plant-based or vegetarian meals using beans and soy foods for protein.  Eat whole, unprocessed foods to limit the amount of sodium (salt) you eat.  Choose a consistent amount of carbohydrate at each meal and snack. Limit refined carbohydrates especially sugar, sweets and sugar-sweetened beverages.  If you drink alcohol, do so in moderation: one serving per day (women) and two servings per day (men). o One serving is equivalent to 12 ounces beer, 5 ounces wine, or 1.5 ounces distilled spirits  Tips Tips for Choosing Heart-Healthy Fats Choose lean protein and low-fat dairy foods to reduce saturated fat intake. Saturated fat is usually found in animal-based protein and is associated with certain health risks. Saturated fat is the biggest contributor to raise low-density lipoprotein (LDL) cholesterol levels. Research shows that limiting saturated fat lowers unhealthy cholesterol levels. Eat no more than 7% of your total calories each day from saturated fat. Ask your RDN to help you determine how much saturated fat is right for you. There are many foods that do not contain large amounts of saturated fats. Swapping these foods to replace foods high in saturated fats will help you limit the saturated fat you eat and improve your cholesterol levels. You can also try eating more plant-based or vegetarian meals. Instead of. Try:  Whole milk, cheese, yogurt, and ice cream 1% or skim milk, low-fat cheese, non-fat yogurt, and low-fat ice cream  Fatty, marbled beef and pork Lean beef, pork, or venison  Poultry with skin Poultry without skin  Butter, stick margarine Reduced-fat, whipped, or liquid spreads  Coconut oil, palm oil Liquid vegetable oils: corn, canola, olive, soybean and safflower oils  Avoid foods that contain trans fats. Trans fats increase levels of LDL-cholesterol. Hydrogenated fat in processed foods is the main source of trans fats in  foods.  Trans fats can be found in stick margarine, shortening, processed sweets, baked goods, some fried foods, and packaged foods made with hydrogenated oils. Avoid foods with "partially hydrogenated oil" on the ingredient list such as: cookies, pastries, baked goods, biscuits, crackers, microwave popcorn, and frozen dinners.  Choose foods with heart healthy fats. Polyunsaturated and monounsaturated fat are unsaturated fats that may help lower your blood cholesterol level when used in place of saturated fat in your diet. Ask your RDN about taking a dietary supplement with plant sterols and stanols to help lower your cholesterol level. Research shows that substituting saturated fats with unsaturated fats is beneficial to cholesterol levels. Try these easy swaps: Instead of. Try:  Butter, stick margarine, or solid shortening Reduced-fat, whipped, or liquid spreads  Beef, pork, or poultry with skin Fish and seafood  Chips, crackers, snack foods Raw or unsalted nuts and seeds or nut butters Hummus with vegetables Avocado on toast  Coconut oil, palm oil Liquid vegetable oils: corn, canola, olive, soybean and safflower oils   Limit the amount of cholesterol you eat to less than 200 milligrams per day. Cholesterol is a substance carried through the bloodstream via lipoproteins, which are known as "transporters" of fat. Some body functions need cholesterol to work properly, but too much cholesterol in the bloodstream can damage arteries and build up blood vessel linings (which can lead to heart attack and stroke). You should eat less than 200 milligrams cholesterol per day. People respond differently to eating cholesterol. There is no test available right now that can figure out which people will respond more to dietary cholesterol and which will respond less. For individuals with high intake of dietary cholesterol, different types of increase (none, small, moderate, large) in LDL-cholesterol levels are  all possible.  Food sources of cholesterol include egg yolks and organ meats such as liver, gizzards. Limit egg yolks to two to four per week and avoid organ meats like liver and gizzards to control cholesterol intake.  Tips for Choosing Heart-Healthy Carbohydrates Consume a consistent amount of carbohydrate It is important to eat foods with carbohydrates in moderation because they impact your blood glucose level. Carbohydrates can be found in many foods such as: Grains (breads, crackers, rice, pasta, and cereals)  Starchy Vegetables (potatoes, corn, and peas)  Beans and legumes  Milk, soy milk, and yogurt  Fruit and fruit juice  Sweets (cakes, cookies, ice cream, jam and jelly) Your RDN will help you set a goal for how many carbohydrate servings to eat at your meals and snacks. For many adults, eating 3 to 5 servings of carbohydrate foods at each meal and 1 or 2 carbohydrate servings for each snack works well.  Check your blood glucose level regularly. It can tell you if you need to adjust when you eat carbohydrates.  Choose foods rich in viscous (soluble) fiber Viscous, or soluble, is found in the walls of plant cells. Viscous fiber is found only in plant-based foods. Eating foods with fiber helps to lower your unhealthy cholesterol and keep your blood glucose in range  Rich sources of viscous fiber include vegetables (asparagus, Brussels sprouts, sweet potatoes, turnips) fruit (apricots, mangoes, oranges), legumes, and whole grains (barley, oats, and oat bran).  As you increase your fiber intake gradually, also increase the amount of water you drink. This will help prevent constipation.  If you have difficulty achieving this goal, ask your RDN about fiber laxatives. Choose fiber supplements made with viscous fibers such as psyllium seed husks or methylcellulose to help lower unhealthy cholesterol.   Limit refined carbohydrates  There are three types of carbohydrates: starches, sugar, and  fiber. Some carbohydrates occur naturally in food, like the starches in rice or corn or the sugars in fruits and milk. Refined carbohydrates--foods with high amounts of simple sugars--can raise triglyceride levels. High triglyceride levels are associated with coronary heart disease. Some examples of refined carbohydrate foods are table sugar, sweets, and beverages sweetened with added sugar.  Tips for Reducing Sodium (Salt) Although sodium is important for your body to function, too much sodium can be harmful for people with high blood pressure. As sodium and fluid buildup in your tissues and bloodstream, your blood pressure increases. High blood pressure may cause damage to other organs and increase your risk for a stroke. Even if you take a pill for blood pressure or a water pill (diuretic) to remove fluid, it is still important to have less salt in your diet. Ask your doctor and RDN what amount of sodium is right for you. Avoid processed foods. Eat more fresh foods.  Fresh fruits and vegetables are naturally low in sodium, as well as frozen vegetables and fruits that have no added juices or sauces.  Fresh meats are lower in sodium than processed meats, such as bacon, sausage, and hotdogs. Read the nutrition label or ask your butcher to help you find a fresh meat that is low in sodium. Eat less salt--at the table and when cooking.  A single teaspoon of table salt has 2,300 mg of sodium.  Leave the salt out of recipes for pasta, casseroles, and soups.  Ask your RDN how to cook your favorite recipes without sodium Be a smart shopper.  Look for food packages that say "salt-free" or "sodium-free." These items contain less than 5 milligrams of sodium per serving.  "Very low-sodium" products contain less than 35 milligrams of sodium per serving.  "Low-sodium" products contain less than 140 milligrams of sodium per serving.  Beware for "Unsalted" or "No Added Salt" products. These items may still be high  in sodium. Check the nutrition label. Add flavors to your food without adding sodium.  Try lemon juice, lime juice, fruit juice or vinegar.  Dry or fresh herbs add flavor. Try basil, bay leaf, dill, rosemary, parsley, sage, dry mustard, nutmeg, thyme, and paprika.  Pepper, red pepper flakes, and cayenne pepper can add spice t your meals without adding sodium. Hot sauce contains sodium, but if you use just a drop or two, it will not add up to much.  Buy a sodium-free seasoning blend or make your own at home.  Additional Lifestyle Tips Achieve and maintain a healthy weight. Talk with your RDN or your doctor about what is a healthy weight for you. Set goals to reach and maintain that weight.  To lose weight, reduce your calorie intake along with increasing your physical activity. A weight loss of 10 to 15 pounds could reduce LDL-cholesterol by 5 milligrams per deciliter.  Participate in physical activity. Talk with your health care team to find out what types of physical activity are best for you. Set a plan to get about 30 minutes of exercise on most days.  Foods Recommended Food Group Foods Recommended  Grains Whole grain breads and cereals, including whole wheat, barley, rye, buckwheat, corn, teff, quinoa, millet, amaranth, brown or  wild rice, sorghum, and oats Pasta, especially whole wheat or other whole grain types  [employer], quinoa or wild rice Whole grain crackers, bread, rolls, pitas Home-made bread with reduced-sodium baking soda  Protein Foods Lean cuts of beef and pork (loin, leg, round, extra lean hamburger)  Skinless Cytogeneticist and other wild game Dried beans and peas Nuts and nut butters Meat alternatives made with soy or textured vegetable protein  Egg whites or egg substitute Cold cuts made with lean meat or soy protein  Dairy Nonfat (skim), low-fat, or 1%-fat milk  Nonfat or low-fat yogurt or cottage cheese Fat-free and low-fat cheese  Vegetables Fresh,  frozen, or canned vegetables without added fat or salt   Fruits Fresh, frozen, canned, or dried fruit   Oils Unsaturated oils (corn, olive, peanut, soy, sunflower, canola)  Soft or liquid margarines and vegetable oil spreads  Salad dressings Seeds and nuts  Avocado   Foods Not Recommended Food Group Foods Not Recommended  Grains Breads or crackers topped with salt Cereals (hot or cold) with more than 300 mg sodium per serving Biscuits, cornbread, and other "quick" breads prepared with baking soda Bread crumbs or stuffing mix from a store High-fat bakery products, such as doughnuts, biscuits, croissants, danish pastries, pies, cookies Instant cooking foods to which you add hot water and stir--potatoes, noodles, rice, etc. Packaged starchy foods--seasoned noodle or rice dishes, stuffing mix, macaroni and cheese dinner Snacks made with partially hydrogenated oils, including chips, cheese puffs, snack mixes, regular crackers, butter-flavored popcorn  Protein Foods Higher-fat cuts of meats (ribs, t-bone steak, regular hamburger) Bacon, sausage, or hot dogs Cold cuts, such as salami or bologna, deli meats, cured meats, corned beef Organ meats (liver, brains, gizzards, sweetbreads) Poultry with skin Fried or smoked meat, poultry, and fish Whole eggs and egg yolks (more than 2-4 per week) Salted legumes, nuts, seeds, or nut/seed butters Meat alternatives with high levels of sodium (>300 mg per serving) or saturated fat (>5 g per serving)  Dairy Whole milk,?2% fat milk, buttermilk Whole milk yogurt or ice cream Cream Half-&-half Cream cheese Sour cream Cheese  Vegetables Canned or frozen vegetables with salt, fresh vegetables prepared with salt, butter, cheese, or cream sauce Fried vegetables Pickled vegetables such as olives, pickles, or sauerkraut  Fruits Fried fruits Fruits served with butter or cream  Oils Butter, stick margarine, shortening Partially hydrogenated oils or trans  fats Tropical oils (coconut, palm, palm kernel oils)  Other Candy, sugar sweetened soft drinks and desserts Salt, sea salt, garlic salt, and seasoning mixes containing salt Bouillon cubes Ketchup, barbecue sauce, Worcestershire sauce, soy sauce, teriyaki sauce Miso Salsa Pickles, olives, relish   Heart Healthy Consistent Carbohydrate Vegetarian (Lacto-Ovo) Sample 1-Day Menu  Breakfast 1 cup oatmeal, cooked (2 carbohydrate servings)   cup blueberries (1 carbohydrate serving)  11 almonds, without salt  1 cup 1% milk (1 carbohydrate serving)  1 cup coffee  Morning Snack 1 cup fat-free plain yogurt (1 carbohydrate serving)  Lunch 1 whole wheat bun (1 carbohydrate servings)  1 black bean burger (1 carbohydrate servings)  1 slice cheddar cheese, low sodium  2 slices tomatoes  2 leaves lettuce  1 teaspoon mustard  1 small pear (1 carbohydrate servings)  1 cup green tea, unsweetened  Afternoon Snack 1/3 cup trail mix with nuts, seeds, and raisins, without salt (1 carbohydrate servinga)  Evening Meal  cup meatless chicken  2/3 cup brown rice, cooked (2 carbohydrate servings)  1 cup broccoli, cooked (2/3 carbohydrate  serving)   cup carrots, cooked (1/3 carbohydrate serving)  2 teaspoons olive oil  1 teaspoon balsamic vinegar  1 whole wheat dinner roll (1 carbohydrate serving)  1 teaspoon margarine, soft, tub  1 cup 1% milk (1 carbohydrate serving)  Evening Snack 1 extra small banana (1 carbohydrate serving)  1 tablespoon peanut butter   Heart Healthy Consistent Carbohydrate Vegan Sample 1-Day Menu  Breakfast 1 cup oatmeal, cooked (2 carbohydrate servings)   cup blueberries (1 carbohydrate serving)  11 almonds, without salt  1 cup soymilk fortified with calcium, vitamin B12, and vitamin D  1 cup coffee  Morning Snack 6 ounces soy yogurt (1 carbohydrate servings)  Lunch 1 whole wheat bun(1 carbohydrate servings)  1 black bean burger (1 carbohydrate serving)  2 slices  tomatoes  2 leaves lettuce  1 teaspoon mustard  1 small pear (1 carbohydrate servings)  1 cup green tea, unsweetened  Afternoon Snack 1/3 cup trail mix with nuts, seeds, and raisins, without salt (1 carbohydrate servings)  Evening Meal  cup meatless chicken  2/3 cup brown rice, cooked (2 carbohydrate servings)  1 cup broccoli, cooked (2/3 carbohydrate serving)   cup carrots, cooked (1/3 carbohydrate serving)  2 teaspoons olive oil  1 teaspoon balsamic vinegar  1 whole wheat dinner roll (1 carbohydrate serving)  1 teaspoon margarine, soft, tub  1 cup soymilk fortified with calcium, vitamin B12, and vitamin D  Evening Snack 1 extra small banana (1 carbohydrate serving)  1 tablespoon peanut butter    Heart Healthy Consistent Carbohydrate Sample 1-Day Menu  Breakfast 1 cup cooked oatmeal (2 carbohydrate servings)  3/4 cup blueberries (1 carbohydrate serving)  1 ounce almonds  1 cup skim milk (1 carbohydrate serving)  1 cup coffee  Morning Snack 1 cup sugar-free nonfat yogurt (1 carbohydrate serving)  Lunch 2 slices whole-wheat bread (2 carbohydrate servings)  2 ounces lean [place] breast  1 ounce low-fat Swiss cheese  1 teaspoon mustard  1 slice tomato  1 lettuce leaf  1 small pear (1 carbohydrate serving)  1 cup skim milk (1 carbohydrate serving)  Afternoon Snack 1 ounce trail mix with unsalted nuts, seeds, and raisins (1 carbohydrate serving)  Evening Meal 3 ounces salmon  2/3 cup cooked brown rice (2 carbohydrate servings)  1 teaspoon soft margarine  1 cup cooked broccoli with 1/2 cup cooked carrots (1 carbohydrate serving  Carrots, cooked, boiled, drained, without salt  1 cup lettuce  1 teaspoon olive oil with vinegar for dressing  1 small whole grain roll (1 carbohydrate serving)  1 teaspoon soft margarine  1 cup unsweetened tea  Evening Snack 1 extra-small banana (1 carbohydrate serving)  Copyright 2020  Academy of Nutrition and Dietetics. All rights reserved.

## 2022-07-13 NOTE — Plan of Care (Signed)
  Problem: Education: Goal: Knowledge of General Education information will improve Description: Including pain rating scale, medication(s)/side effects and non-pharmacologic comfort measures Outcome: Progressing   Problem: Health Behavior/Discharge Planning: Goal: Ability to manage health-related needs will improve Outcome: Progressing   Problem: Clinical Measurements: Goal: Ability to maintain clinical measurements within normal limits will improve Outcome: Progressing   Problem: Pain Managment: Goal: General experience of comfort will improve Outcome: Progressing   

## 2022-07-13 NOTE — Progress Notes (Signed)
Physical Therapy Treatment Patient Details Name: [name] MRN: [id number] DOB: 11-08-1976 Today's Date: 07/13/2022   History of Present Illness Pt is a 46 y.o. female who had recent eye sx 07/02/22, now admitted 07/05/22 with submandibular and pharyngeal abscess requiring intubation; ETT 7/31-8/5. Developed AKI 8/1. 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 Other PMH includes L BKA, dCHF, CAD, CKD, COPD, DM2, HTN, morbid obesity, OSA, asthma, bipolar disorder.   PT Comments    Pt progressing with mobility; received mobilizing in hallway in electric wheelchair family brought from home. R foot post-op shoe delivered by ortho tech; pt declines transfer training with shoe donned, focused on wanting to get off unit. Educ re: precautions, post-op shoe wear, activity recommendations, pressure relief schedule; pt reports no further questions or concerns. Will continue to follow acutely to address established goals.    Recommendations for follow up therapy are one component of a multi-disciplinary discharge planning process, led by the attending physician.  Recommendations may be updated based on patient status, additional functional criteria and insurance authorization.  Follow Up Recommendations  Home health PT     Assistance Recommended at Discharge Frequent or constant Supervision/Assistance  Patient can return home with the following A little help with walking and/or transfers;A little help with bathing/dressing/bathroom;Assistance with cooking/housework;Assist for transportation   Equipment Recommendations  None recommended by PT    Recommendations for Other Services       Precautions / Restrictions Precautions Precautions: Fall;Other (comment) Precaution Comments: h/o L BKA (has LLE prosthetic) Required Braces or Orthoses: Other Brace Other Brace: R post-op shoe Restrictions Weight Bearing  Restrictions: Yes RLE Weight Bearing: Weight bearing as tolerated Other Position/Activity Restrictions: per podiatry note 8/4, WBAT in post-op shoe     Mobility  Bed Mobility               General bed mobility comments: received sitting in electric w/c    Transfers                   General transfer comment: pt received sitting in her electric w/c, reports she transferred to it without assist but had difficulty; post-op shoe still not delivered to room. pt declined additional transfer training    Ambulation/Gait                   Theme park manager mobility: Yes Wheelchair parts: Independent Wheelchair Assistance Details (indicate cue type and reason): pt indep mobilizing with electric scooter/wheelchair; demonstrates all functions; discussed transfer technique, pt prefers to transfer by lifting R armrest and reaching to L armrest to complete pivot  Modified Rankin (Stroke Patients Only)       Balance Overall balance assessment: No apparent balance deficits (not formally assessed) Sitting-balance support: No upper extremity supported Sitting balance-Leahy Scale: Good                                      Cognition Arousal/Alertness: Awake/alert Behavior During Therapy: WFL for tasks assessed/performed Overall Cognitive Status: No family/caregiver present to determine baseline cognitive functioning Area of Impairment: Attention, Awareness                   Current Attention Level: Selective       Awareness:  Emergent   General Comments: easily distracted in conversation, decreased attention requiring redirection to current topic; pt focused on waiting for MD to get back to her about being able to leave unit        Exercises      General Comments General comments (skin integrity, edema, etc.): post-op shoe delivered after session, spoke with pt who confirmed  trying it on. educ re: precautions, post-op shoe wear, activity recommendations, pressure relief schedule; pt declined additional mobility with post-op shoe      Pertinent Vitals/Pain Pain Assessment Pain Assessment: Faces Faces Pain Scale: Hurts little more Pain Location: back Pain Descriptors / Indicators: Discomfort Pain Intervention(s): Monitored during session    Home Living                          Prior Function            PT Goals (current goals can now be found in the care plan section) Progress towards PT goals: Progressing toward goals    Frequency    Min 3X/week      PT Plan Current plan remains appropriate    Co-evaluation              AM-PAC PT "6 Clicks" Mobility   Outcome Measure  Help needed turning from your back to your side while in a flat bed without using bedrails?: A Little Help needed moving from lying on your back to sitting on the side of a flat bed without using bedrails?: A Little Help needed moving to and from a bed to a chair (including a wheelchair)?: A Little Help needed standing up from a chair using your arms (e.g., wheelchair or bedside chair)?: A Little Help needed to walk in hospital room?: A Lot Help needed climbing 3-5 steps with a railing? : Total 6 Click Score: 15    End of Session   Activity Tolerance: Patient tolerated treatment well Patient left: in chair;with family/visitor present (in electric w/c) Nurse Communication: Mobility status;Other (comment) (pt requesting to speak to chaplain regarding HPOA) PT Visit Diagnosis: Other abnormalities of gait and mobility (R26.89);Muscle weakness (generalized) (M62.81)     Time: 6213-0865 PT Time Calculation (min) (ACUTE ONLY): 16 min  Charges:  $Self Care/Home Management: [place], PT, DPT Acute Rehabilitation Services  Personal: [facility] Office: [place] 07/13/2022, 4:11 PM

## 2022-07-13 NOTE — Progress Notes (Signed)
Daily Progress Note Intern Pager: [phone]  Patient name: [name] Medical record number: [id number] Date of birth: 1976-03-27 Age: 45 y.o. Gender: female  Primary Care Provider: [name], MD Consultants: ENT, pharmacy, respiratory therapy, speech. OT, PT Code Status: FULL  Pt Overview and Major Events to Date:  -7/28 - Underwent eye surgery at [facility] -7/31 Presented to [facility] ED via EMS for progressively worsening throat pain and associated dysphagia, ENT consulted and recommended transfer to [facility] ED for nasopharyngeal scope.While awaiting bed became stridorous with concern for airway compromise and anesthesia called to intubate. Intubated in ED. PCCM consulted for admission and escalation of care. -8/1 transitioned abx to Unasyn, MAP 50s > transitioned to Precedex and Fentanyl with Levophed if needed, development of AKI -8/2 febrile to 102.1, start tube feeds -8/3 nephrology consulted for worsening kidney function -8/5 extubated in OR -8/7 transferred back to floor  Assessment and Plan:  [name] 46 year old female presented with submandibular abscess with pharyngeal abscess requiring intubation and ICU admission. Hospital course complicated by acute kidney injury and dysphagia. 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 Admit to ICU. Re-intubated and stabilized, now extubated. Follow up CT stable. ENT, pharmacy following. WBC downtrending. SLP completed FEES study. NGT d/c'd - Re-dmit to FPTS, Progressive, Attending Dr. Owens Shark - ENT following, appreciate recs - Monitor airway and resp status - NPO, hold home medications  - Continue ampicillin-sulbactam (Unasyn) 3g IV (scheduled for 13 doses) until 8/10 (for total of 10 days) -  PT/OT Eval and Treat    Acute respiratory failure with hypoxia [facility]) Patient became stridorous while waiting for floor bed and required intubation (successful on second attempt). Placed on MV in ICU, now extubated but continues to require oxygen. On scheduled and PRN nebulizers, oxygen via nasal cannula. Sleeps with BIPAP. Now on RA, SpO2 in 90s - can resume BIPAP - oxygen via nasal cannula PRN - continuous pulse ox - continue arformoterol, duoneb - continue albuterol PRN - f/u CXR  Hypermagnesemia Resolved. Mg 2.2 (8/8) - CTM Mg  Hypernatremia Resolved. Na 141 (8/8) - free water 200 mL  Acute tubular necrosis ([name]) Hx CKD3. Improving, Cr 1.72 - Follow BMP - trend NA -f/u with nephrology in 6 weeks  Diastolic HF (heart failure) ([name]) Chronic, stable. Hx CAD, HTN, HLD. - RESUMED home amlodipine - Monitor strict I&Os - continue aspirin 81 mg - hold diuretics - f/u CXR  Type 2 diabetes mellitus with diabetic neuropathy, unspecified ([place]) Improving. Was poorly controlled in the 200s to 300s, now in the high 100s. On home duloxetine, gabapentin (1200 BID) for diabetic peripheral neuropathy.  - RESTARTED home duloxetine - continue gabapentin 600 mg BID, hold home dose - continue SSI + Levemir, novolog  Status post eye surgery Unknown eye surgery prior to procedure. On multiple eye drops - continue eye drops - follow-up with ophthalmology after d/c  Chest pain Patient mentioned chest pain in the ED, resolved spontaneously. Currently hemodynamically stable. EKG 8/5 shows age indeterminate lateral infarct - new compared to previous EKG on 7/31. - continue to monitor   Gastrointestinal discomfort Nausea, constipation. Stool twice yesterday (8/7). On carb-controlled renal diet - continue carb-controlled renal diet - PRN zofran for nausea - continue BID senna, miralax - START 1 time glycerin suppository  FEN/GI: NPO, Senna BID, PEG BID PPx: heparin Dispo:Home with  home health pending clinical improvement .  Subjective:  Patient wanted to resume duloxetine and increase gabapentin dose to home dose. Expressed interest in going to cafeteria to buy a salad  Objective: Temp:  [97.6 F (36.4 C)-98.5 F (36.9 C)] 98.5 F (36.9 C) (08/08 1116) Pulse Rate:  [90-102] 93 (08/08 1116) Resp:  [12-20] 17 (08/08 1116) BP: (105-148)/(39-93) 125/87 (08/08 1116) SpO2:  [94 %-98 %] 96 % (08/08 0844) Weight:  [316 lb 5.8 oz (143.5 kg)] 316 lb 5.8 oz (143.5 kg) (08/08 0325) Physical Exam: General: well-appearing and in no acute distress HEENT: normocephalic and atraumatic Cardiovascular: regular rate and distant heart sounds Respiratory: CTAB posteriorly, normal respiratory effort, on RA, and decreased throughout Gastrointestinal: non-tender and non-distended Extremities:  amputated L leg, partially amputated R foot Neuro: following commands and no focal neurological deficits   Laboratory: Most recent CBC Lab Results  Component Value Date   WBC 13.9 (H) 07/13/2022   HGB 11.7 (L) 07/13/2022   HCT 38.3 07/13/2022   MCV 94.6 07/13/2022   PLT 512 (H) 07/13/2022   Most recent BMP    Latest Ref Rng & Units 07/13/2022    1:07 AM  BMP  Glucose 70 - 99 mg/dL 193   BUN 6 - 20 mg/dL 53   Creatinine 0.44 - 1.00 mg/dL 1.72   Sodium 135 - 145 mmol/L 141   Potassium 3.5 - 5.1 mmol/L 3.2   Chloride 98 - 111 mmol/L 103   CO2 22 - 32 mmol/L 26   Calcium 8.9 - 10.3 mg/dL 9.3     Other pertinent labs   WBC up from 11.6  Camelia Phenes, MD 07/13/2022, 12:27 PM  PGY-1, [place] Intern pager: [phone], text pages welcome Secure chat group [place]

## 2022-07-13 NOTE — Progress Notes (Signed)
Tech reported to Probation officer that she was called into the room by patient to pick something up off the floor for her that she had dropped.  Tech witnessed small clear bag with white powder. She asked patient what it was and the only answer she got was, "please don't say anything." Tech believed substance to have looked like cocaine or similar substance; however, gave back to patient.   Tech then reported to TEFL teacher.  I had been in there about 30 mins earlier, and patient had asked to use my scissors to which I let her. She had taken a straw and cut a 2 inch piece off, which I felt looked like a straw to sniff drugs but writer did not think at the time that patient would do that in front of staff if that was the intention.  Patient asked tech several times more after this throughout the rest of the shift again to not say anything.  AC notified, drug test ordered, unit manager notified, attending team notified.

## 2022-07-13 NOTE — Progress Notes (Addendum)
Patient decided to leave hospital AMA. MD notified and patient packed up all her belongings, sat in battery operated wheelchair and took off out the door. Had to chase patient down to get the PIV out of her arm. [place] police officers stopped the patient in the street and asked to patient to stop. Another person was with me and police officer watched me remove her PIV. Patient was safely escorted by police officers.

## 2022-07-13 NOTE — Progress Notes (Addendum)
Nutrition Follow-up  DOCUMENTATION CODES:   Morbid obesity  INTERVENTION:  Liberalize diet from a renal/carb modified to a carb modified diet to provide widest variety of menu options to enhance nutritional adequacy Glucerna Shake po TID, each supplement provides 220 kcal and 10 grams of protein Referral placed to outpatient diabetes education center "Plate Method" and "Heart Healthy Consistent Carbohydrate Nutrition Therapy" handout added to AVS  NUTRITION DIAGNOSIS:   Inadequate oral intake related to inability to eat as evidenced by NPO status.  Progressing-diet advanced   GOAL:   Patient will meet greater than or equal to 90% of their needs  Goal not met- addressing via diet liberalization and nutrition supplements   MONITOR:   PO intake, Supplement acceptance, Diet advancement, Labs, Weight trends, Skin  REASON FOR ASSESSMENT:   Ventilator, Consult Enteral/tube feeding initiation and management  ASSESSMENT:   46 yo female admitted with pharyngeal abscess, concern for Ludwig's angina. PMH includes recent eye surgery 7/28, HTN, HLD, CHF, CAD, COPD/asthma, OSA, DM-2, peripheral neuropathy, left BKA, R TMA, GERD, bipolar disorder.  8/2: TF started 8/5: extubated 8/7: s/p FEES-recommend regular diet; NG tube removed  Nephrology c/s for management of AKI. UOP and Cr stabilized. no acute indication for dialysis.   No documented meal completions.  She was previously having episodes of n/v which have resolved since NG tube has been removed. Pt states that she has not had much of an appetite since her NG tube has been removed. She has been craving a chef salad from the cafeteria but has not had family visit today to bring one to her. She states that her family had brought her chili cheese fries and a burger from the cafeteria last night but she only ate 1 fry and threw the rest away. Denies any difficulty chewing and swallowing.   Discussed blood sugar control. She states  that at home all she eats is vegetables and chicken and avoids using added salt with her meals. She checks her blood sugar daily and states that it had been all over the place and running more elevated PTA. Pt mentioned seeing a provider in [facility] for wt loss and is hoping her A1c is reasonable for gastric bypass surgery. Briefly dicussed nutrition for diabetes management and placed referral for outpatient diet education center which she is agreeable to.   Admit wt: 342 lbs Current wt: 317 lbs  Edema: mild generalized  Medications: SSI 0-20 units TID, SSI 0-5 units qhs, levemir 35 units q12h,  MVI, miralax, klor-con, senna, IV abx  Labs: sodium 141 (wdl), potassium 3.2 (L), BUN 53, Cr 1.72, ionized Ca 1.11, phos 4.2 (wdl), GFR 37, CBG's 148-275 x24 hours  Diet Order:   Diet Order             Diet Carb Modified Fluid consistency: Thin; Room service appropriate? Yes; Fluid restriction: 1200 mL Fluid  Diet effective now                   EDUCATION NEEDS:   Education needs have been addressed  Skin:  Skin Assessment: Reviewed RN Assessment Other: MASD to abdomen  Last BM:  8/8 (type 7)  Height:   Ht Readings from Last 1 Encounters:  07/06/22 5' (1.524 m)    Weight:   Wt Readings from Last 1 Encounters:  07/13/22 (!) 143.5 kg    Ideal Body Weight:  42 kg  BMI:  Body mass index is 61.78 kg/m.  Estimated Nutritional Needs:   Kcal:  1600-1800  Protein:  80-95g  Fluid:  >/=1.6L  Clayborne Dana, RDN, LDN Clinical Nutrition

## 2022-07-14 ENCOUNTER — Other Ambulatory Visit: Payer: Self-pay | Admitting: Family Medicine

## 2022-07-15 ENCOUNTER — Other Ambulatory Visit: Payer: Self-pay

## 2022-07-16 NOTE — Telephone Encounter (Signed)
I am not able to refill patient's oxycodone at this time. She should already have plenty at home to last until her follow up appointment with me, since she was in the hospital for 9 days during the last month and last had her medication filled on  7/14, less than 30 days ago.   I also need to discuss with her the events that transpired at the end of her hospitalization - she was suspected to be using cocaine in her hospital room prior to leaving abruptly against medical advice. It would not be safe or good care for me to refill this medication at this time without a face to face discussion. She has an appointment with me on Monday, in 3 days.  [name], MD

## 2022-07-19 ENCOUNTER — Encounter: Payer: Self-pay | Admitting: Family Medicine

## 2022-07-19 ENCOUNTER — Ambulatory Visit (INDEPENDENT_AMBULATORY_CARE_PROVIDER_SITE_OTHER): Payer: Medicaid Other | Admitting: Family Medicine

## 2022-07-19 VITALS — BP 115/70 | HR 87 | Ht 60.0 in

## 2022-07-19 DIAGNOSIS — G8929 Other chronic pain: Secondary | ICD-10-CM | POA: Diagnosis not present

## 2022-07-19 DIAGNOSIS — E1140 Type 2 diabetes mellitus with diabetic neuropathy, unspecified: Secondary | ICD-10-CM

## 2022-07-19 DIAGNOSIS — K122 Cellulitis and abscess of mouth: Secondary | ICD-10-CM | POA: Diagnosis not present

## 2022-07-19 DIAGNOSIS — J391 Other abscess of pharynx: Secondary | ICD-10-CM

## 2022-07-19 DIAGNOSIS — Z89512 Acquired absence of left leg below knee: Secondary | ICD-10-CM

## 2022-07-19 DIAGNOSIS — Z794 Long term (current) use of insulin: Secondary | ICD-10-CM

## 2022-07-19 DIAGNOSIS — Z79899 Other long term (current) drug therapy: Secondary | ICD-10-CM

## 2022-07-19 DIAGNOSIS — N1831 Chronic kidney disease, stage 3a: Secondary | ICD-10-CM

## 2022-07-19 NOTE — Progress Notes (Signed)
  Date of Visit: 07/19/2022   SUBJECTIVE:   HPI:  [name] presents today for hospital follow up.  Was hospitalized for ludwigs angina for 9 days, from 7/31 to 07/13/22. Was intubated for 6 days, extubated in OR on 8/5. Patient left AMA on 8/8 after being found with an envelope containing a white powder, suspicious for cocaine.  Reports she's been doing okay since being in the hospital. Breathing fine, eating, drinking, and swallowing fine.  Did not complete course of antibiotics since she left AMA.  She had a big AKI while in the hospital, but never required dialysis.  She reports she has a follow-up visit scheduled on Wednesday with nephrology.  Patient requests refill of her pain medication.  Had frank discussion with her today about the white powder found in her hospital room.  Patient denies using any cocaine or other substances in 5 or 6 years.  She denies using cocaine in the hospital.  Initially patient stated that she has not really taken her medicine the last few days because she has not had it.  On further questioning she reports she has taken it some, and has about 2 days left.  I then discussed that she was in the hospital for 9 days and should have that much left.  Her story seemed to shift slightly as we discussed.  She requests prescriptions for a socket and accessories for her left BKA.  Her current prosthesis is too big.  Request referral to a new endocrinologist here in town as she is presently established with [facility] and it is too far for her to travel.  She previously saw Dr. Kelton Pillar with [facility] endocrinology and is agreeable to following up there.  OBJECTIVE:   BP 115/70   Pulse 87   Ht 5' (1.524 m)   LMP 07/05/2022 (Approximate)   SpO2 97%   BMI 61.78 kg/m  Gen: No acute distress, pleasant, cooperative HEENT: Normocephalic, atraumatic Heart: Regular rate and rhythm, no murmur Lungs: Normal respiratory effort, clear to auscultation bilaterally Ext: Left BKA present  with prosthesis.  Status post right transmetatarsal amputation, wearing socks, did not removed.  ASSESSMENT/PLAN:   Encounter for chronic pain management I am concerned about the report of using a powdered substance in the hospital.  Also concerning is the patient's story seem to shift today when I inquired about how much of her pain medication she has left.  She has never given me indications of misuse in the past 4 to 5 years.  Discussed with patient today that we would do a urine drug screen.  I will not refill until this has resulted.  I am ordering a send out lab for full confirmation.  Discussed with patient that I care very much about her my goal and duty is to keep her safe, including if that means not prescribing her pain medications due to substance use.  She seemed understanding.  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[place]) Will send prescription for socket and accessories for her left BKA to [facility] at patient's request.  Type 2 diabetes mellitus with diabetic neuropathy, unspecified ([place]) Refer back to [facility] endocrinology to reestablish with Dr. Kelton Pillar so she can connect with a local endocrinologist.  Pharyngeal abscess Improved since leaving the hospital.  Monitor for signs of recurrence.  Chronic kidney disease, stage 3a ([place]) Update renal function today.  Follow-up with nephrology as scheduled.  FOLLOW UP: Follow up in 1 mo for above issues  [name], [place]

## 2022-07-19 NOTE — Assessment & Plan Note (Signed)
I am concerned about the report of using a powdered substance in the hospital.  Also concerning is the patient's story seem to shift today when I inquired about how much of her pain medication she has left.  She has never given me indications of misuse in the past 4 to 5 years.  Discussed with patient today that we would do a urine drug screen.  I will not refill until this has resulted.  I am ordering a send out lab for full confirmation.  Discussed with patient that I care very much about her my goal and duty is to keep her safe, including if that means not prescribing her pain medications due to substance use.  She seemed understanding.

## 2022-07-19 NOTE — Assessment & Plan Note (Signed)
Update renal function today.  Follow-up with nephrology as scheduled.

## 2022-07-19 NOTE — Addendum Note (Signed)
Addended by: [name] on: 07/19/2022 04:36 PM   Modules accepted: Orders

## 2022-07-19 NOTE — Patient Instructions (Addendum)
Doing urine test today. Will await results before refilling pain medication. Checking kidney function and blood counts today.  Follow up with podiatry, nephrology, eye doctors as scheduled  Follow up with me in 1 month  Be well, Dr. [name]

## 2022-07-19 NOTE — Assessment & Plan Note (Signed)
Refer back to [facility] endocrinology to reestablish with Dr. Kelton Pillar so she can connect with a local endocrinologist.

## 2022-07-19 NOTE — Assessment & Plan Note (Signed)
Will send prescription for socket and accessories for her left BKA to [facility] at patient's request.

## 2022-07-19 NOTE — Assessment & Plan Note (Signed)
Improved since leaving the hospital.  Monitor for signs of recurrence.

## 2022-07-20 LAB — CBC
Hematocrit: 38.3 % (ref 34.0–46.6)
Hemoglobin: 12.3 g/dL (ref 11.1–15.9)
MCH: 29.2 pg (ref 26.6–33.0)
MCHC: 32.1 g/dL (ref 31.5–35.7)
MCV: 91 fL (ref 79–97)
Platelets: 487 10*3/uL — ABNORMAL HIGH (ref 150–450)
RBC: 4.21 x10E6/uL (ref 3.77–5.28)
RDW: 14.3 % (ref 11.7–15.4)
WBC: 10.3 10*3/uL (ref 3.4–10.8)

## 2022-07-20 LAB — BASIC METABOLIC PANEL
BUN/Creatinine Ratio: 32 — ABNORMAL HIGH (ref 9–23)
BUN: 46 mg/dL — ABNORMAL HIGH (ref 6–24)
CO2: 25 mmol/L (ref 20–29)
Calcium: 9 mg/dL (ref 8.7–10.2)
Chloride: 91 mmol/L — ABNORMAL LOW (ref 96–106)
Creatinine, Ser: 1.42 mg/dL — ABNORMAL HIGH (ref 0.57–1.00)
Glucose: 336 mg/dL — ABNORMAL HIGH (ref 70–99)
Potassium: 4.3 mmol/L (ref 3.5–5.2)
Sodium: 134 mmol/L (ref 134–144)
eGFR: 47 mL/min/{1.73_m2} — ABNORMAL LOW (ref >59)

## 2022-07-22 ENCOUNTER — Telehealth: Payer: Self-pay | Admitting: Family Medicine

## 2022-07-22 LAB — TOXASSURE SELECT 13 (MW), URINE

## 2022-07-22 MED ORDER — TRAZODONE HCL 50 MG PO TABS
25.0000 mg | ORAL_TABLET | Freq: Every evening | ORAL | 0 refills | Status: DC | PRN
Start: 2022-07-22 — End: 2022-08-19

## 2022-07-22 NOTE — Telephone Encounter (Signed)
Called patient to discuss results of urine drug screen.  This was positive for cocaine.  Patient had seen the results on MyChart.  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  She understands I cannot prescribe her any narcotics right now with cocaine use, but it would not be safe or good medical care.  She asks what she needs to do in order to be eligible for another prescription of oxycodone.  I discussed that we needed to speak in person to have a more thorough discussion.  Visit scheduled on 08/19/2022.  Discussed that I will not be prescribing her any pain medication at that appointment.  We will likely do a drug screen that day. Further decisions about any narcotic prescribing will depend on our discussion and future UDS results.  Patient does request rx for trazodone to help with sleep, she received this after discharge from rehab unit in June and it helped her sleep. Will send in '25mg'$  nightly. Discussed risks of sedation especially if using other substances.  Patient appreciative of the call.  [name], MD

## 2022-07-23 ENCOUNTER — Telehealth: Payer: Self-pay | Admitting: Internal Medicine

## 2022-07-28 NOTE — Telephone Encounter (Signed)
error 

## 2022-07-29 ENCOUNTER — Telehealth: Payer: Self-pay | Admitting: Family Medicine

## 2022-07-29 NOTE — Telephone Encounter (Signed)
**  After Hours/ Emergency Line Call**  Received after hours emergency line call from [name], who reports throat discomfort ever since her recent hospitalization when she was intubated. Describes a globus sensation x2 weeks.   Has used a spray once or twice (presumably chlorhexidine) without significant change.   Upon chart review, patient had some dysphagia following extubation but FEES was normal during admission. At hospital follow up on 8/14 with PCP, Dr [name], she reported no difficulty with swallowing.  A/P: Globus sensation s/p intubation.  Present x2 weeks without acute change. No red flags. Reassurance provided and informed patient this was non-emergent issue that does not require acute intervention by on-call provider in the middle of the night. Encouraged her to schedule office visit or discuss with Dr [name] during office hours.   Will route to PCP as FYI.  Alcus Dad, MD PGY-2, [facility] 07/29/2022 1:34 AM

## 2022-08-12 ENCOUNTER — Encounter: Payer: Self-pay | Admitting: Podiatry

## 2022-08-12 ENCOUNTER — Ambulatory Visit (INDEPENDENT_AMBULATORY_CARE_PROVIDER_SITE_OTHER): Payer: Medicaid Other | Admitting: Podiatry

## 2022-08-12 DIAGNOSIS — M7751 Other enthesopathy of right foot: Secondary | ICD-10-CM | POA: Diagnosis not present

## 2022-08-12 DIAGNOSIS — Z89431 Acquired absence of right foot: Secondary | ICD-10-CM

## 2022-08-12 NOTE — Progress Notes (Signed)
Subjective:  Patient ID: [name], female    DOB: November 21, 1976,  MRN: [id number]  Chief Complaint  Patient presents with   Foot Pain    DOS: 05/21/2022 Procedure: Right revisional partial first ray amputation.    46 y.o. female returns for post-op check. S she states an injection incision is doing well.  It has healed up.  She is having pain on the right lateral ankle.  She would like to know if she can get a steroid injection  Review of Systems: Negative except as noted in the HPI. Denies N/V/F/Ch.  Past Medical History:  Diagnosis Date   Abscess of groin, left    Acute on chronic diastolic (congestive) heart failure ([place]) 02/05/2022   Acute on chronic respiratory failure with hypoxia ([name]) 04/11/2022   Acute osteomyelitis of ankle or foot, left ([place]) 01/31/2018   Acute respiratory failure with hypoxia and hypercarbia ([place]) 06/15/2020   Alveolar hypoventilation    Amputation stump infection ([place]) 04/09/2018   Anemia    not on iron pill   Asthma    Bipolar 2 disorder ([name])    Candidiasis of vagina 05/15/2022   Carpal tunnel syndrome on right    recurrent   Cellulitis 08/2010-08/2011   Chest pain with low risk for cardiac etiology 05/11/2021   Chronic pain    COPD (chronic obstructive pulmonary disease) ([name])    Symbicort daily and Proventil as needed   Costochondritis    De Quervain's tenosynovitis, bilateral 11/01/2015   Depression    Diabetes mellitus type II, uncontrolled 2000   Type 2, Uncontrolled.Takes Lantus daily.Fasting blood sugar runs 150   Diabetic ulcer of right foot ([place]) 12/19/2021   Drug-seeking behavior    Elevated troponin    Exposure to mold 04/21/2020   GERD (gastroesophageal reflux disease)    takes Pantoprazole and Zantac daily   HLD (hyperlipidemia)    takes Atorvastatin daily   Hypertension    takes Lisinopril and Coreg daily   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[place]) 04/11/2019   Left shoulder pain 06/23/2019   Morbid obesity ([place])    Morbid obesity with BMI of  60.0-69.9, adult ([place]) 04/15/2021   Nocturia    Nonobstructive atherosclerosis of coronary artery 11/26/2020   11/26/20 Coronary angiogram: 1.  Mild diffuse proximal LAD stenosis with no evidence of obstructive disease (20 to 30% diffuse stenosis);  2.  Left dominant circumflex with widely patent obtuse marginal branches and left PDA branch, no significant stenoses present  3.  Nondominant RCA with mild diffuse nonobstructive stenosis    OSA on CPAP    Peripheral neuropathy    takes Gabapentin daily   Pneumonia    "walking" several yrs ago and as a baby (12/05/2018)   Pneumonia due to COVID-19 virus 04/14/2021   Primary osteoarthritis of first carpometacarpal joint of left hand 07/30/2016   Rectal fissure    Restless leg    Right carpal tunnel syndrome 09/01/2011   SVT (supraventricular tachycardia) ([place])    Syncope 02/25/2016   Thrombocytosis 04/11/2022   Urinary frequency    Urinary incontinence 10/23/2020   UTI (urinary tract infection) 04/15/2021   Varicose veins    Right medial thigh and Left leg     Current Outpatient Medications:    Accu-Chek FastClix Lancets MISC, USE DAILY AS INSTRUCTED, Disp: 102 each, Rfl: 3   ACCU-CHEK GUIDE test strip, USE T0 CHECK BLOOD SUGAR 3 TIMES DAILY, Disp: 100 strip, Rfl: 5   Accu-Chek Softclix Lancets lancets, Use to check blood glucose  four times daily, Disp: 100 each, Rfl: 12   acetaminophen (TYLENOL) 325 MG tablet, Take 1-2 tablets (325-650 mg total) by mouth every 4 (four) hours as needed for mild pain., Disp: , Rfl:    albuterol (VENTOLIN HFA) 108 (90 Base) MCG/ACT inhaler, INHALE 1 TO 2 PUFFS BY MOUTH EVERY SIX HOURS AS NEEDED FOR WHEEZING OR SHORTNESS OF BREATH (Patient taking differently: Inhale 1 puff into the lungs every 6 (six) hours as needed for shortness of breath.), Disp: 6.7 g, Rfl: 4   amLODipine (NORVASC) 10 MG tablet, Take 1 tablet (10 mg total) by mouth daily., Disp: 30 tablet, Rfl: 0   aspirin EC 81 MG tablet, Take 1 tablet (81 mg  total) by mouth daily. Swallow whole., Disp: 90 tablet, Rfl: 3   atropine 1 % ophthalmic solution, Place 1 drop into the right eye daily., Disp: , Rfl:    Brinzolamide-Brimonidine (SIMBRINZA) 1-0.2 % SUSP, Place 1 drop into the left eye in the morning, at noon, and at bedtime., Disp: , Rfl:    cetirizine (ZYRTEC) 10 MG tablet, Take 1 tablet (10 mg total) by mouth daily as needed for allergies., Disp: 30 tablet, Rfl: 3   Cholecalciferol 1000 units tablet, Take 1 tablet (1,000 Units total) by mouth daily., Disp: 90 tablet, Rfl: 0   Continuous Blood Gluc Sensor (DEXCOM G6 SENSOR) MISC, Inject 1 applicator into the skin as directed. Change sensor every 10 days., Disp: 3 each, Rfl: 11   Continuous Blood Gluc Transmit (DEXCOM G6 TRANSMITTER) MISC, Inject 1 Device into the skin as directed. Reuse 8 times with sensor changes., Disp: 1 each, Rfl: 3   DULoxetine (CYMBALTA) 30 MG capsule, Take 1 capsule (30 mg total) by mouth daily. X 1 week; then 60 mg daily- for knee and back/nerve pain (Patient taking differently: Take 60 mg by mouth daily.), Disp: 60 capsule, Rfl: 5   gabapentin (NEURONTIN) 600 MG tablet, Take 2 tablets (1,200 mg total) by mouth 2 (two) times daily., Disp: 120 tablet, Rfl: 0   Glucagon (BAQSIMI ONE PACK) 3 MG/DOSE POWD, Place 3 mg into the nose as needed (low blood sugar)., Disp: , Rfl:    Glucagon, rDNA, (GLUCAGON EMERGENCY) 1 MG KIT, Inject 1 mg into the muscle as needed for low blood sugar., Disp: , Rfl:    GNP ULTICARE PEN NEEDLES 32G X 4 MM MISC, USE TO inject insulin 2 TIMES DAILY, Disp: 100 each, Rfl: 2   insulin aspart (NOVOLOG FLEXPEN) 100 UNIT/ML FlexPen, Inject 20 Units into the skin 3 (three) times daily with meals., Disp: 45 mL, Rfl: 3   insulin degludec (TRESIBA FLEXTOUCH) 200 UNIT/ML FlexTouch Pen, Inject 52 Units into the skin 2 (two) times daily., Disp: 30 mL, Rfl: 3   latanoprost (XALATAN) 0.005 % ophthalmic solution, Place 1 drop into the left eye at bedtime., Disp: , Rfl:     moxifloxacin (VIGAMOX) 0.5 % ophthalmic solution, Place 1 drop into the right eye 4 (four) times daily., Disp: , Rfl:    neomycin-polymyxin b-dexamethasone (MAXITROL) 3.5-10000-0.1 OINT, Place 1 Application into the right eye at bedtime., Disp: , Rfl:    nystatin (MYCOSTATIN/NYSTOP) powder, Apply 1 application. topically 3 (three) times daily., Disp: 30 g, Rfl: 0   ondansetron (ZOFRAN) 4 MG tablet, Take 1 tablet (4 mg total) by mouth every 8 (eight) hours as needed for nausea or vomiting., Disp: 60 tablet, Rfl: 1   oxyCODONE-acetaminophen (PERCOCET) 7.5-325 MG tablet, Take 1 tablet by mouth every 6 (six) hours as needed  for severe pain., Disp: 120 tablet, Rfl: 0   prednisoLONE acetate (PRED FORTE) 1 % ophthalmic suspension, Place 1 drop into both eyes 4 (four) times daily. (Patient taking differently: Place 1 drop into the right eye 4 (four) times daily.), Disp: 5 mL, Rfl: 0   SYMBICORT 160-4.5 MCG/ACT inhaler, inhale 2 PUFFS into THE lungs 2 TIMES DAILY (Patient taking differently: 2 puffs every 6 (six) hours. Shortness of breath), Disp: 10.2 g, Rfl: 4   Tiotropium Bromide Monohydrate (SPIRIVA RESPIMAT) 1.25 MCG/ACT AERS, Inhale 2 puffs into the lungs daily., Disp: 1 each, Rfl: 0   torsemide 40 MG TABS, Take 40 mg by mouth 2 (two) times daily., Disp: 60 tablet, Rfl: 0   traZODone (DESYREL) 50 MG tablet, Take 0.5 tablets (25 mg total) by mouth at bedtime as needed for sleep., Disp: 30 tablet, Rfl: 0   triamcinolone ointment (KENALOG) 0.5 %, Apply 1 Application topically 2 (two) times daily., Disp: 30 g, Rfl: 1  Social History   Tobacco Use  Smoking Status Former   Packs/day: 0.25   Years: 15.00   Total pack years: 3.75   Types: Cigarettes   Quit date: 12/06/2005   Years since quitting: 16.6  Smokeless Tobacco Never  Tobacco Comments   smokes for a couple of months    Allergies  Allergen Reactions   Cefepime Other (See Comments)    AKI, see records from [place] hospitalization in January  2020.   Other reaction(s): Other (See Comments) "Shut down organs/kidneys"  Note pt has tolerated Rocephin and Keflex   Iodine Other (See Comments)    Kidney dysfunction   Kiwi Extract Shortness Of Breath, Swelling and Anaphylaxis   Morphine And Related Nausea And Vomiting   Nalbuphine Other (See Comments)    Hallucinations     Trental [Pentoxifylline] Nausea And Vomiting   Cephalosporins Other (See Comments)    Pt states that they cause her kidneys to shut down   Toradol [Ketorolac Tromethamine] Other (See Comments)    Feels like something is crawling on me   Morphine Nausea And Vomiting   Nubain [Nalbuphine Hcl] Other (See Comments)    "FEELS LIKE SOMETHING CRAWLING ON ME"   Objective:  There were no vitals filed for this visit. There is no height or weight on file to calculate BMI. Constitutional Well developed. Well nourished.  Vascular Foot warm and well perfused. Capillary refill normal to all digits.   Neurologic Normal speech. Oriented to person, place, and time. Epicritic sensation to light touch grossly present bilaterally.  Dermatologic Incision completely reepithelialized.  No signs of Deis is noted no complication noted..  Orthopedic: Tenderness to palpation noted about the surgical site.   Radiographs: 3 views of skeletally mature the right foot: Right partial first ray amputation site noted.  No signs of foreign body noted.  No signs of Deis infection noted no signs of osteomyelitis noted. Assessment:   No diagnosis found.  Plan:  Patient was evaluated and treated and all questions answered.  S/p foot surgery right -Progressing as expected post-operatively. -XR: See above -WB Status: WBAT in the ankle brace -Sutures: None No signs of Deis is noted no complication noted. -Medications: None  Right ankle capsulitis- -I explained the patient etiology of capsulitis versus treatment options were discussed.  Given the amount of pain that she is having she  will benefit from a steroid injection of decrease inflammatory component associate with pain.  Patient agrees with plan to proceed with injection. -A steroid injection was  performed at right ankle joint using 1% plain Lidocaine and 10 mg of Kenalog. This was well tolerated.    No follow-ups on file.  Sclerosing

## 2022-08-13 ENCOUNTER — Other Ambulatory Visit: Payer: Self-pay | Admitting: Family Medicine

## 2022-08-15 ENCOUNTER — Emergency Department (HOSPITAL_COMMUNITY): Payer: Medicaid Other

## 2022-08-15 ENCOUNTER — Emergency Department (HOSPITAL_COMMUNITY)
Admission: EM | Admit: 2022-08-15 | Discharge: 2022-08-15 | Discharge status: Against Medical Advice | Payer: Medicaid Other | Attending: Emergency Medicine | Admitting: Emergency Medicine

## 2022-08-15 ENCOUNTER — Other Ambulatory Visit: Payer: Self-pay

## 2022-08-15 ENCOUNTER — Encounter (HOSPITAL_COMMUNITY): Payer: Self-pay | Admitting: Emergency Medicine

## 2022-08-15 DIAGNOSIS — R079 Chest pain, unspecified: Secondary | ICD-10-CM | POA: Insufficient documentation

## 2022-08-15 DIAGNOSIS — Z5321 Procedure and treatment not carried out due to patient leaving prior to being seen by health care provider: Secondary | ICD-10-CM | POA: Insufficient documentation

## 2022-08-15 DIAGNOSIS — R0602 Shortness of breath: Secondary | ICD-10-CM | POA: Diagnosis not present

## 2022-08-15 DIAGNOSIS — I2510 Atherosclerotic heart disease of native coronary artery without angina pectoris: Secondary | ICD-10-CM | POA: Diagnosis not present

## 2022-08-15 LAB — I-STAT BETA HCG BLOOD, ED (MC, WL, AP ONLY): I-stat hCG, quantitative: <5 m[IU]/mL (ref <5)

## 2022-08-15 LAB — CBC
HCT: 36.2 % (ref 36.0–46.0)
Hemoglobin: 11.8 g/dL — ABNORMAL LOW (ref 12.0–15.0)
MCH: 30.4 pg (ref 26.0–34.0)
MCHC: 32.6 g/dL (ref 30.0–36.0)
MCV: 93.3 fL (ref 80.0–100.0)
Platelets: 469 10*3/uL — ABNORMAL HIGH (ref 150–400)
RBC: 3.88 MIL/uL (ref 3.87–5.11)
RDW: 15.7 % — ABNORMAL HIGH (ref 11.5–15.5)
WBC: 11.2 10*3/uL — ABNORMAL HIGH (ref 4.0–10.5)
nRBC: 0 % (ref 0.0–0.2)

## 2022-08-15 LAB — BASIC METABOLIC PANEL
Anion gap: 9 (ref 5–15)
BUN: 57 mg/dL — ABNORMAL HIGH (ref 6–20)
CO2: 29 mmol/L (ref 22–32)
Calcium: 8.8 mg/dL — ABNORMAL LOW (ref 8.9–10.3)
Chloride: 98 mmol/L (ref 98–111)
Creatinine, Ser: 1.51 mg/dL — ABNORMAL HIGH (ref 0.44–1.00)
GFR, Estimated: 43 mL/min — ABNORMAL LOW (ref >60)
Glucose, Bld: 169 mg/dL — ABNORMAL HIGH (ref 70–99)
Potassium: 3.9 mmol/L (ref 3.5–5.1)
Sodium: 136 mmol/L (ref 135–145)

## 2022-08-15 LAB — PROTIME-INR
INR: 1 (ref 0.8–1.2)
Prothrombin Time: 13 seconds (ref 11.4–15.2)

## 2022-08-15 LAB — TROPONIN I (HIGH SENSITIVITY)
Troponin I (High Sensitivity): 17 ng/L (ref <18)
Troponin I (High Sensitivity): 21 ng/L — ABNORMAL HIGH (ref <18)

## 2022-08-15 NOTE — ED Triage Notes (Signed)
Patient reports central chest pain with SOB this evening , she received ASA 324 mg by EMS . No emesis or diaphoresis . History of CAD.

## 2022-08-15 NOTE — ED Notes (Signed)
Pt left due to not being seen quick enough 

## 2022-08-19 ENCOUNTER — Ambulatory Visit (INDEPENDENT_AMBULATORY_CARE_PROVIDER_SITE_OTHER): Payer: Medicaid Other | Admitting: Family Medicine

## 2022-08-19 ENCOUNTER — Encounter: Payer: Self-pay | Admitting: Family Medicine

## 2022-08-19 VITALS — BP 132/86 | HR 102 | Ht 60.0 in

## 2022-08-19 DIAGNOSIS — I10 Essential (primary) hypertension: Secondary | ICD-10-CM

## 2022-08-19 DIAGNOSIS — G8929 Other chronic pain: Secondary | ICD-10-CM

## 2022-08-19 DIAGNOSIS — E1140 Type 2 diabetes mellitus with diabetic neuropathy, unspecified: Secondary | ICD-10-CM | POA: Diagnosis not present

## 2022-08-19 DIAGNOSIS — K219 Gastro-esophageal reflux disease without esophagitis: Secondary | ICD-10-CM | POA: Diagnosis not present

## 2022-08-19 DIAGNOSIS — Z59819 Housing instability, housed unspecified: Secondary | ICD-10-CM

## 2022-08-19 DIAGNOSIS — Z89512 Acquired absence of left leg below knee: Secondary | ICD-10-CM

## 2022-08-19 DIAGNOSIS — Z794 Long term (current) use of insulin: Secondary | ICD-10-CM

## 2022-08-19 DIAGNOSIS — G4700 Insomnia, unspecified: Secondary | ICD-10-CM

## 2022-08-19 MED ORDER — TERCONAZOLE 0.4 % VA CREA
TOPICAL_CREAM | VAGINAL | 0 refills | Status: DC
Start: 2022-08-19 — End: 2022-11-30

## 2022-08-19 MED ORDER — TRAZODONE HCL 100 MG PO TABS: 100.0000 mg | ORAL_TABLET | Freq: Every evening | ORAL | 1 refills | Status: DC | PRN

## 2022-08-19 MED ORDER — AMLODIPINE BESYLATE 10 MG PO TABS: 10.0000 mg | ORAL_TABLET | Freq: Every day | ORAL | 0 refills | Status: DC

## 2022-08-19 MED ORDER — PANTOPRAZOLE SODIUM 20 MG PO TBEC
20.0000 mg | DELAYED_RELEASE_TABLET | Freq: Every day | ORAL | 3 refills | Status: DC
Start: 2022-08-19 — End: 2022-09-27

## 2022-08-19 MED ORDER — DULOXETINE HCL 60 MG PO CPEP: 120.0000 mg | ORAL_CAPSULE | Freq: Every day | ORAL | 1 refills | Status: DC

## 2022-08-19 NOTE — Progress Notes (Unsigned)
Date of Visit: 08/19/2022   SUBJECTIVE:   HPI:  [name] presents today for routine follow-up.  She has many issues she wants to discuss today as follows:  Hypertension: Currently taking amlodipine 10 mg daily.  She has been out for about a week.  Needs refill.  Back pain: Has an area of skin breakdown on her lower back after being in the hospital.  It is overall improving but still remains sore.  Has been applying Voltaren gel.  Chronic pain: After her last visit we discontinued her oxycodone-acetaminophen as she was positive for cocaine in her urine drug screen.  She reports she has not used any cocaine since we last spoke.  She understands why I cannot prescribe this medication to her at this time.  She is agreeable to giving a urine drug screen today.  Her goal is to get back on the medication at some point.  Reports she cries at night due to pain.  He milligrams daily.  Agreeable to trying to go up on this dose.  Difficulty sleeping: Taking trazodone 50 mg at bedtime.  Would like to go up on the dose as it helps some but not all the way.  She does use her PAP machine nightly   Pain in her leg: Has pain on the back of her right calf rubs against her wheelchair.  She damaged her motorized wheelchair while "escaping from the hospital" and has had to use her mechanical wheelchair since that time.  Refill of vaginal cream: In the past has had an antifungal cream that she has used on her vulva as needed.  Request refill of this.  She believes it was to call back.  Diabetes: Has established with [facility] endocrinology, but is having difficulty obtaining her [place].  Reports he needs a prior authorization, that has to be done by the endocrinologist office.  Both she and her pharmacy have called the endocrinologist office without any results.  Stooling: Has noted she stools herself at night when she is asleep.  Stools are soft and runny while she is asleep.  This has gone on for about 2 weeks.  She is  continent of stool during the daytime.  GERD: Desires refill of PPI.  She has been out of it for some time but did see benefit when she is to take it.  Having increased reflux symptoms lately.  Has had some sore throat since being hospitalized for [place]'s angina and requiring intubation.  She does have follow-up with ENT in place.  Has noted she has been coughing since that hospitalization as well.  Cough is nonproductive.  Status post left BKA: Request referral to orthopedist other than Dr. Sharol Given.  She wants to follow-up with an orthopedist for ongoing issues with her stump  Housing issues - having to move out of her apartment as they no longer take section 8 housing. She would like to speak with a  Education officer, museum.  OBJECTIVE:   BP 132/86   Pulse (!) 102   Ht 5' (1.524 m)   LMP 08/09/2022   SpO2 96%   BMI 61.78 kg/m  Gen: no acute distress pleasant, cooperative HEENT: normocephalic, atraumatic  Lungs: speaks in full sentences without any respiratory distress, normal quality of voice Neuro: alert, speech normal, grossly nonfocal Ext: posterior aspect of RLE with mild irritation/nodule on upper calf at site of repeated contact with mechanical wheelchair Back with mild skin breakdown and scabbing over lumbar spine bilaterally  ASSESSMENT/PLAN:   Encounter for chronic  pain management Again reviewed with patient that I am unable to refill any narcotics for her at this time due to her cocaine use.  Discussed with her that most concerning is that she was dishonest with me about using the cocaine.  We will obtain urine drug screen today.  Discussed I will need to see multiple subsequent clear urine drug screens before we will even consider prescribing any further chronic narcotics.  Increase duloxetine to 120 mg daily.  Has follow-up with pain management as well.  I will see her back in 1 month.  Hypertension Well-controlled.  Continue current regimen.  Refill amlodipine.  GERD Needing  Protonix.  Sent in refill.  We will see if this helps with her cough and sore throat.  Type 2 diabetes mellitus with diabetic neuropathy, unspecified ([place]) Encouraged her to follow-up with her endocrinology office via phone to continue requesting refill of Tresiba, since I am not able to process a prior Auth from that office.  Insomnia Compliant with Pap therapy, so we will cautiously go up on trazodone at bedtime to help with sleep.  Left below-knee amputee [facility]) Referring to new orthopedist at her request.  Irritation of R posterior calf No signs of infection. Recommend applying some padding to the leg of her wheelchair to prevent her leg from rubbing up against it while she is riding in it.  Vulvar irritation Refill terconazole cream for occasional use.  Follow-up if not improving.  Housing instability Place referral to [facility] managed care social work for housing resources.  Incontinence of stool I am less concerned that she is continent of stool during the day.  Discussed stool bulking measures including increasing fiber intake.  She will give this a try.  Skin breakdown on low back back Area appears to be healing, continue to monitor  FOLLOW UP: Follow up in 1 mo for above issues  [name], [place]

## 2022-08-19 NOTE — Patient Instructions (Addendum)
It was great to see you again today.  Call endocrinologist office again about your insulin prior auth.  Sent in protonix for GERD, terconazole cream for yeast, amlodipine for blood pressure.  Go up to '120mg'$  daily on cymbalta Go up to '100mg'$  at night on trazodone.  Referring to new orthopedist for your stump  Try to put some padding on your wheelchair to help protect your right calf skin  Drug screen today  Will ask social worker to reach out to you about housing  Increase fiber intake to help with stools  Follow up with me in 1 month   Be well, Dr. [name]

## 2022-08-20 ENCOUNTER — Encounter: Payer: Self-pay | Admitting: Family Medicine

## 2022-08-20 NOTE — Assessment & Plan Note (Signed)
Again reviewed with patient that I am unable to refill any narcotics for her at this time due to her cocaine use.  Discussed with her that most concerning is that she was dishonest with me about using the cocaine.  We will obtain urine drug screen today.  Discussed I will need to see multiple subsequent clear urine drug screens before we will even consider prescribing any further chronic narcotics.  Increase duloxetine to 120 mg daily.  Has follow-up with pain management as well.  I will see her back in 1 month.

## 2022-08-20 NOTE — Assessment & Plan Note (Signed)
Referring to new orthopedist at her request.

## 2022-08-20 NOTE — Assessment & Plan Note (Signed)
Well-controlled.  Continue current regimen.  Refill amlodipine.

## 2022-08-20 NOTE — Assessment & Plan Note (Signed)
Encouraged her to follow-up with her endocrinology office via phone to continue requesting refill of Tresiba, since I am not able to process a prior Auth from that office.

## 2022-08-20 NOTE — Assessment & Plan Note (Addendum)
Needing Protonix.  Sent in refill.  We will see if this helps with her cough and sore throat.

## 2022-08-20 NOTE — Assessment & Plan Note (Signed)
Compliant with Pap therapy, so we will cautiously go up on trazodone at bedtime to help with sleep.

## 2022-08-21 LAB — MED LIST OPTION NOT SELECTED

## 2022-08-23 LAB — DRUG SCR UR, PAIN MGMT, REFLEX CONF
Amphetamine Scrn, Ur: NEGATIVE ng/mL
BARBITURATE SCREEN URINE: NEGATIVE ng/mL
BENZODIAZEPINE SCREEN, URINE: NEGATIVE ng/mL
Cocaine (Metab) Scrn, Ur: NEGATIVE ng/mL
Creatinine(Crt), U: 22.4 mg/dL (ref 20.0–300.0)
Methadone Screen, Urine: NEGATIVE ng/mL
OXYCODONE+OXYMORPHONE UR QL SCN: NEGATIVE ng/mL
Opiate Scrn, Ur: NEGATIVE ng/mL
Ph of Urine: 5.3 (ref 4.5–8.9)
Phencyclidine Qn, Ur: NEGATIVE ng/mL
Propoxyphene Scrn, Ur: NEGATIVE ng/mL

## 2022-08-26 ENCOUNTER — Encounter: Payer: Self-pay | Admitting: Family Medicine

## 2022-08-27 NOTE — Telephone Encounter (Signed)
Called patient. Received message stating that number was not in service. Will send mychart message to patient.   Talbot Grumbling, RN

## 2022-08-30 ENCOUNTER — Encounter
Payer: Medicaid Other | Attending: Physical Medicine and Rehabilitation | Admitting: Physical Medicine and Rehabilitation

## 2022-08-31 ENCOUNTER — Ambulatory Visit: Payer: Medicaid Other | Admitting: Rehabilitation

## 2022-09-02 ENCOUNTER — Encounter: Payer: Self-pay | Admitting: Cardiovascular Disease

## 2022-09-02 ENCOUNTER — Ambulatory Visit: Payer: Medicaid Other | Attending: Cardiovascular Disease | Admitting: Cardiovascular Disease

## 2022-09-02 VITALS — BP 122/60 | HR 95 | Ht 62.0 in | Wt 362.2 lb

## 2022-09-02 DIAGNOSIS — E662 Morbid (severe) obesity with alveolar hypoventilation: Secondary | ICD-10-CM | POA: Insufficient documentation

## 2022-09-02 DIAGNOSIS — E782 Mixed hyperlipidemia: Secondary | ICD-10-CM | POA: Diagnosis not present

## 2022-09-02 DIAGNOSIS — I5033 Acute on chronic diastolic (congestive) heart failure: Secondary | ICD-10-CM | POA: Diagnosis not present

## 2022-09-02 MED ORDER — TORSEMIDE 20 MG PO TABS: 80.0000 mg | ORAL_TABLET | Freq: Two times a day (BID) | ORAL | 3 refills | Status: DC

## 2022-09-02 NOTE — Patient Instructions (Addendum)
Medication Instructions:  INCREASE Torsemide to '80mg'$  twice daily *If you need a refill on your cardiac medications before your next appointment, please call your pharmacy*   Lab Work: BMET and Lipids in 2 weeks If you have labs (blood work) drawn today and your tests are completely normal, you will receive your results only by: [place] (if you have MyChart) OR A paper copy in the mail If you have any lab test that is abnormal or we need to change your treatment, we will call you to review the results.   Testing/Procedures: Ambulatory referral to heart failure clinic   Follow-Up: At [facility], you and your health needs are our priority.  As part of our continuing mission to provide you with exceptional heart care, we have created designated Provider Care Teams.  These Care Teams include your primary Cardiologist (physician) and Advanced Practice Providers (APPs -  Physician Assistants and Nurse Practitioners) who all work together to provide you with the care you need, when you need it.  Your next appointment:   As needed  The format for your next appointment:   In Person  Provider:   Sherren Mocha, MD       Important Information About Sugar

## 2022-09-02 NOTE — Progress Notes (Signed)
Cardiology Office Note:    Date:  09/02/2022   ID:  [name], DOB March 09, 1976, MRN [id number]  PCP:  [name], MD   [facility] Cardiologist:  Sherren Mocha, MD Sleep Medicine:  Fransico Him, MD     Referring MD: [name], MD   Chief Complaint  Patient presents with   Shortness of Breath    History of Present Illness:    [name] is a 46 y.o. female with a hx of super morbid obesity, uncontrolled diabetes, peripheral arterial disease with left leg BKA and right transmetatarsal amputations, obesity hypoventilation syndrome, and chronic diastolic heart failure, presenting for follow-up evaluation.   The patient is here with her friend today.  She is getting along okay, but complains of edema and tightness in her right calf related to fluid retention.  She is chronically short of breath with no recent change.  She has had no recent chest pain or pressure.  She is trying to take her medications better in a more compliant manner.  She admits that she is drinking too much water.  She is drinking about 4 large water bottles of water each day.  States that her diabetes control has been a little better, but her most recent hemoglobin A1c was 14.9.  Past Medical History:  Diagnosis Date   Abscess of groin, left    Acute on chronic diastolic (congestive) heart failure ([place]) 02/05/2022   Acute on chronic respiratory failure with hypoxia ([name]) 04/11/2022   Acute osteomyelitis of ankle or foot, left ([place]) 01/31/2018   Acute respiratory failure with hypoxia and hypercarbia ([place]) 06/15/2020   Alveolar hypoventilation    Amputation stump infection ([place]) 04/09/2018   Anemia    not on iron pill   Asthma    Bipolar 2 disorder ([name])    Candidiasis of vagina 05/15/2022   Carpal tunnel syndrome on right    recurrent   Cellulitis 08/2010-08/2011   Chest pain with low risk for cardiac etiology 05/11/2021   Chronic pain    COPD (chronic obstructive pulmonary  disease) ([name])    Symbicort daily and Proventil as needed   Costochondritis    De Quervain's tenosynovitis, bilateral 11/01/2015   Depression    Diabetes mellitus type II, uncontrolled 2000   Type 2, Uncontrolled.Takes Lantus daily.Fasting blood sugar runs 150   Diabetic ulcer of right foot ([place]) 12/19/2021   Drug-seeking behavior    Elevated troponin    Exposure to mold 04/21/2020   GERD (gastroesophageal reflux disease)    takes Pantoprazole and Zantac daily   HLD (hyperlipidemia)    takes Atorvastatin daily   Hypertension    takes Lisinopril and Coreg daily   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[place]) 04/11/2019   Left shoulder pain 06/23/2019   Morbid obesity ([place])    Morbid obesity with BMI of 60.0-69.9, adult ([place]) 04/15/2021   Nocturia    Nonobstructive atherosclerosis of coronary artery 11/26/2020   11/26/20 Coronary angiogram: 1.  Mild diffuse proximal LAD stenosis with no evidence of obstructive disease (20 to 30% diffuse stenosis);  2.  Left dominant circumflex with widely patent obtuse marginal branches and left PDA branch, no significant stenoses present  3.  Nondominant RCA with mild diffuse nonobstructive stenosis    OSA on CPAP    Peripheral neuropathy    takes Gabapentin daily   Pneumonia    "walking" several yrs ago and as a baby (12/05/2018)   Pneumonia due to COVID-19 virus 04/14/2021   Primary  osteoarthritis of first carpometacarpal joint of left hand 07/30/2016   Rectal fissure    Restless leg    Right carpal tunnel syndrome 09/01/2011   SVT (supraventricular tachycardia) ([place])    Syncope 02/25/2016   Thrombocytosis 04/11/2022   Urinary frequency    Urinary incontinence 10/23/2020   UTI (urinary tract infection) 04/15/2021   Varicose veins    Right medial thigh and Left leg     Past Surgical History:  Procedure Laterality Date   AMPUTATION Left 02/01/2018   Procedure: LEFT FOURTH AND 5TH TOE RAY AMPUTATION;  Surgeon: [name], MD;  Location: [facility];  Service:  Orthopedics;  Laterality: Left;   AMPUTATION Left 03/03/2018   Procedure: LEFT BELOW KNEE AMPUTATION;  Surgeon: [name], MD;  Location: [facility];  Service: Orthopedics;  Laterality: Left;   AMPUTATION Right 05/21/2022   Procedure: REVISION AMPUTATION RAY 1ST;  Surgeon: [name], DPM;  Location: [employer] [facility];  Service: Podiatry;  Laterality: Right;   CARPAL TUNNEL RELEASE Bilateral    CESAREAN SECTION  2007   CORONARY ANGIOGRAPHY N/A 11/26/2020   Procedure: CORONARY ANGIOGRAPHY;  Surgeon: Sherren Mocha, MD;  Location: [facility];  Service: Cardiovascular;  Laterality: N/A;   FOOT AMPUTATION Right    INCISION AND DRAINAGE ABSCESS Left 10/16/2021   Procedure: INCISION AND DRAINAGE ABSCESS;  Surgeon: Dwan Bolt, MD;  Location: [facility];  Service: General;  Laterality: Left;   INCISION AND DRAINAGE PERIRECTAL ABSCESS Left 05/18/2019   Procedure: IRRIGATION AND DEBRIDEMENT OF PANNIS ABSCESS, POSSIBLE DEBRIDEMENT OF BUTTOCK WOUND;  Surgeon: Donnie Mesa, MD;  Location: [facility];  Service: General;  Laterality: Left;   IRRIGATION AND DEBRIDEMENT BUTTOCKS Left 05/17/2019   Procedure: IRRIGATION AND DEBRIDEMENT BUTTOCKS;  Surgeon: Donnie Mesa, MD;  Location: [facility];  Service: General;  Laterality: Left;   KNEE ARTHROSCOPY Right 07/17/2010   LEFT HEART CATHETERIZATION WITH CORONARY ANGIOGRAM N/A 07/27/2012   Procedure: LEFT HEART CATHETERIZATION WITH CORONARY ANGIOGRAM;  Surgeon: Sherren Mocha, MD;  Location: [facility];  Service: Cardiovascular;  Laterality: N/A;   MASS EXCISION N/A 06/29/2013   Procedure:  WIDE LOCAL EXCISION OF POSTERIOR NECK ABSCESS;  Surgeon: Ralene Ok, MD;  Location: [facility];  Service: General;  Laterality: N/A;   REPAIR KNEE LIGAMENT Left    "fixed ligaments and chipped patella"   right transmetatarsal amputation      TRACHEOSTOMY TUBE PLACEMENT N/A 07/10/2022   Procedure: CONTROLLED EXTUBATION;  Surgeon: Izora Gala, MD;  Location: [facility];  Service: ENT;   Laterality: N/A;    Current Medications: Current Meds  Medication Sig   Continuous Blood Gluc Transmit (DEXCOM G6 TRANSMITTER) MISC Inject 1 Device into the skin as directed. Reuse 8 times with sensor changes.   torsemide (DEMADEX) 20 MG tablet Take 4 tablets (80 mg total) by mouth 2 (two) times daily.     Allergies:   Cefepime, Iodine, Kiwi extract, Morphine and related, Nalbuphine, Pentoxifylline, Cephalosporins, Toradol [ketorolac tromethamine], Morphine, and Nubain [nalbuphine hcl]   Social History   Socioeconomic History   Marital status: Married    Spouse name: Not on file   Number of children: Not on file   Years of education: Not on file   Highest education level: Not on file  Occupational History   Not on file  Tobacco Use   Smoking status: Former    Packs/day: 0.25    Years: 15.00    Total pack years: 3.75    Types: Cigarettes  Quit date: 12/06/2005    Years since quitting: 16.7   Smokeless tobacco: Never   Tobacco comments:    smokes for a couple of months  Vaping Use   Vaping Use: Never used  Substance and Sexual Activity   Alcohol use: No    Alcohol/week: 0.0 standard drinks of alcohol   Drug use: Not Currently    Comment: OD attempts on home meds     Sexual activity: Yes    Partners: Male  Other Topics Concern   Not on file  Social History Narrative   Lives in [place] with her fiance and 44 yr old dtr.   Social Determinants of Health   Financial Resource Strain: High Risk (02/19/2021)   Overall Financial Resource Strain (CARDIA)    Difficulty of Paying Living Expenses: Hard  Food Insecurity: Food Insecurity Present (07/19/2022)   Hunger Vital Sign    Worried About Running Out of Food in the Last Year: Often true    Ran Out of Food in the Last Year: Often true  Transportation Needs: No Transportation Needs (02/19/2021)   PRAPARE - Hydrologist (Medical): No    Lack of Transportation (Non-Medical): No  Physical Activity: Not  on file  Stress: Stress Concern Present (02/19/2021)   [place]    Feeling of Stress : Very much  Social Connections: Not on file     Family History: The patient's family history includes Allergic rhinitis in her mother; Anxiety disorder in her sister; Asthma in her child; Cancer in her maternal grandmother; Depression in her mother; Diabetes in her maternal grandmother and mother; GER disease in her mother; Heart attack in her father, paternal grandfather, paternal grandmother, and paternal uncle; Heart disease in her paternal grandfather and paternal grandmother; Hyperlipidemia in her maternal grandmother and mother; Hypertension in her maternal grandmother; Migraines in her sister; Other in her maternal grandfather; Restless legs syndrome in her mother.  ROS:   Please see the history of present illness.    All other systems reviewed and are negative.  EKGs/Labs/Other Studies Reviewed:    The following studies were reviewed today: Echo 02/06/2022:  1. Technically difficult study, very limited views. Unable to assess LV  systolic function. Left ventricular endocardial border not optimally  defined to evaluate regional wall motion. Left ventricular diastolic  parameters are indeterminate.   2. Right ventricular systolic function was not well visualized. The right  ventricular size is not well visualized.   3. The mitral valve was not well visualized. No evidence of mitral valve  regurgitation.   4. The aortic valve was not well visualized. Aortic valve regurgitation  is not visualized.   Cardiac Cath 11/26/2020: 1.  Mild diffuse proximal LAD stenosis with no evidence of obstructive disease (20 to 30% diffuse stenosis) 2.  Left dominant circumflex with widely patent obtuse marginal branches and left PDA branch, no significant stenoses present 3.  Nondominant RCA with mild diffuse nonobstructive stenosis   Recommend:  Ongoing efforts at diabetes control and risk reduction measures.  There is no obstructive CAD to account for the patient's chest discomfort.  EKG:  EKG is not ordered today.    Recent Labs: 05/15/2022: B Natriuretic Peptide 32.7 07/10/2022: ALT 19 07/13/2022: Magnesium 2.2 08/15/2022: BUN 57; Creatinine, Ser 1.51; Hemoglobin 11.8; Platelets 469; Potassium 3.9; Sodium 136  Recent Lipid Panel    Component Value Date/Time   CHOL 330 (H) 02/10/2021 1635   TRIG  225 (H) 07/09/2022 0034   HDL 37 (L) 02/10/2021 1635   CHOLHDL 8.9 (H) 02/10/2021 1635   CHOLHDL 6.3 (H) 01/11/2017 0909   VLDL 52 (H) 01/11/2017 0909   LDLCALC 192 (H) 02/10/2021 1635   LDLDIRECT 164.9 11/15/2014 1148     Risk Assessment/Calculations:                Physical Exam:    VS:  BP 122/60   Pulse 95   Ht '5\' 2"'$  (1.575 m)   Wt (!) 362 lb 3.2 oz (164.3 kg)   LMP 08/09/2022   SpO2 (!) 87%   BMI 66.25 kg/m     Wt Readings from Last 3 Encounters:  09/02/22 (!) 362 lb 3.2 oz (164.3 kg)  07/13/22 (!) 316 lb 5.8 oz (143.5 kg)  06/10/22 (!) 344 lb 12.8 oz (156.4 kg)     GEN: Morbidly obese woman, in wheelchair, in no acute distress HEENT: Normal NECK: No JVD; No carotid bruits LYMPHATICS: No lymphadenopathy CARDIAC: RRR, no murmurs, rubs, gallops RESPIRATORY:  Clear to auscultation without rales, wheezing or rhonchi  ABDOMEN: Soft, non-tender, non-distended, obese MUSCULOSKELETAL: 2+ right calf edema, left leg prosthesis;  SKIN: Warm and dry NEUROLOGIC:  Alert and oriented x 3 PSYCHIATRIC:  Normal affect   ASSESSMENT:    1. Obesity hypoventilation syndrome ([place])   2. Acute on chronic diastolic heart failure ([place])   3. Mixed hyperlipidemia    PLAN:    In order of problems listed above:  Patient with multiple advanced obesity-related comorbidities.  Longstanding uncontrolled diabetes with most recent hemoglobin A1c 14.9.  Previous lipids from 2022 showed a cholesterol of 330 and LDL 192.  Very difficult  to treat her cardiovascular problems with longstanding super morbid obesity and medical noncompliance. Patient with significant right leg edema.  Difficult to assess her jugular veins due to body habitus.  Will increase torsemide to 80 mg twice daily and reassess her labs in 2 weeks.  Recently seen by nephrology after she had severe AKI during her most recent hospitalization.  They have recommended that she establish care in the advanced heart failure clinic for frequent follow-up and titration of diuretic therapy.  We will make a referral so that she have regular APP evaluation in the advanced heart failure clinic. Reviewed with atorvastatin 40 mg daily.  Update lab work with lipids and LFTs in a few weeks.           Medication Adjustments/Labs and Tests Ordered: Current medicines are reviewed at length with the patient today.  Concerns regarding medicines are outlined above.  Orders Placed This Encounter  Procedures   Lipid panel   Basic metabolic panel   AMB referral to CHF clinic   Meds ordered this encounter  Medications   torsemide (DEMADEX) 20 MG tablet    Sig: Take 4 tablets (80 mg total) by mouth 2 (two) times daily.    Dispense:  720 tablet    Refill:  3    Patient Instructions  Medication Instructions:  INCREASE Torsemide to '80mg'$  twice daily *If you need a refill on your cardiac medications before your next appointment, please call your pharmacy*   Lab Work: BMET and Lipids in 2 weeks If you have labs (blood work) drawn today and your tests are completely normal, you will receive your results only by: [place] (if you have MyChart) OR A paper copy in the mail If you have any lab test that is abnormal or we need to change  your treatment, we will call you to review the results.   Testing/Procedures: Ambulatory referral to heart failure clinic   Follow-Up: At [facility], you and your health needs are our priority.  As part of our continuing  mission to provide you with exceptional heart care, we have created designated Provider Care Teams.  These Care Teams include your primary Cardiologist (physician) and Advanced Practice Providers (APPs -  Physician Assistants and Nurse Practitioners) who all work together to provide you with the care you need, when you need it.  Your next appointment:   As needed  The format for your next appointment:   In Person  Provider:   Sherren Mocha, MD       Important Information About Sugar         Signed, Sherren Mocha, MD  09/02/2022 1:51 PM    [place]

## 2022-09-03 ENCOUNTER — Ambulatory Visit: Payer: Medicaid Other | Admitting: Physical Therapy

## 2022-09-07 ENCOUNTER — Other Ambulatory Visit: Payer: Self-pay

## 2022-09-07 ENCOUNTER — Ambulatory Visit: Payer: Commercial Managed Care - HMO | Attending: Podiatry

## 2022-09-07 DIAGNOSIS — R2689 Other abnormalities of gait and mobility: Secondary | ICD-10-CM | POA: Diagnosis present

## 2022-09-07 DIAGNOSIS — Z89431 Acquired absence of right foot: Secondary | ICD-10-CM | POA: Insufficient documentation

## 2022-09-07 DIAGNOSIS — R2681 Unsteadiness on feet: Secondary | ICD-10-CM | POA: Diagnosis present

## 2022-09-07 DIAGNOSIS — R296 Repeated falls: Secondary | ICD-10-CM | POA: Diagnosis present

## 2022-09-07 DIAGNOSIS — M6281 Muscle weakness (generalized): Secondary | ICD-10-CM | POA: Insufficient documentation

## 2022-09-07 NOTE — Therapy (Signed)
OUTPATIENT PHYSICAL THERAPY NEURO EVALUATION   Patient Name: [name] MRN: [id number] DOB:1975/12/09, 46 y.o., female Today's Date: 09/07/2022   PCP: Dr. [name] REFERRING PROVIDER: Dr. Boneta Lucks   PT End of Session - 09/07/22 1323     Visit Number 1    Number of Visits 8    Date for PT Re-Evaluation 11/16/22    Authorization Type Medicaid (eval + 3 visits, need recert at visit 4)    Authorization - Visit Number 0    Authorization - Number of Visits 3    Progress Note Due on Visit 3    PT Start Time 1315    PT Stop Time 1400    PT Time Calculation (min) 45 min    Equipment Utilized During Treatment Gait belt    Activity Tolerance Patient tolerated treatment well    Behavior During Therapy WFL for tasks assessed/performed             Past Medical History:  Diagnosis Date   Abscess of groin, left    Acute on chronic diastolic (congestive) heart failure ([place]) 02/05/2022   Acute on chronic respiratory failure with hypoxia ([place]) 04/11/2022   Acute osteomyelitis of ankle or foot, left ([place]) 01/31/2018   Acute respiratory failure with hypoxia and hypercarbia ([place]) 06/15/2020   Alveolar hypoventilation    Amputation stump infection ([place]) 04/09/2018   Anemia    not on iron pill   Asthma    Bipolar 2 disorder ([name])    Candidiasis of vagina 05/15/2022   Carpal tunnel syndrome on right    recurrent   Cellulitis 08/2010-08/2011   Chest pain with low risk for cardiac etiology 05/11/2021   Chronic pain    COPD (chronic obstructive pulmonary disease) ([name])    Symbicort daily and Proventil as needed   Costochondritis    De Quervain's tenosynovitis, bilateral 11/01/2015   Depression    Diabetes mellitus type II, uncontrolled 2000   Type 2, Uncontrolled.Takes Lantus daily.Fasting blood sugar runs 150   Diabetic ulcer of right foot ([place]) 12/19/2021   Drug-seeking behavior    Elevated troponin    Exposure to mold 04/21/2020   GERD (gastroesophageal reflux disease)    takes  Pantoprazole and Zantac daily   HLD (hyperlipidemia)    takes Atorvastatin daily   Hypertension    takes Lisinopril and Coreg daily   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[place]) 04/11/2019   Left shoulder pain 06/23/2019   Morbid obesity ([place])    Morbid obesity with BMI of 60.0-69.9, adult ([place]) 04/15/2021   Nocturia    Nonobstructive atherosclerosis of coronary artery 11/26/2020   11/26/20 Coronary angiogram: 1.  Mild diffuse proximal LAD stenosis with no evidence of obstructive disease (20 to 30% diffuse stenosis);  2.  Left dominant circumflex with widely patent obtuse marginal branches and left PDA branch, no significant stenoses present  3.  Nondominant RCA with mild diffuse nonobstructive stenosis    OSA on CPAP    Peripheral neuropathy    takes Gabapentin daily   Pneumonia    "walking" several yrs ago and as a baby (12/05/2018)   Pneumonia due to COVID-19 virus 04/14/2021   Primary osteoarthritis of first carpometacarpal joint of left hand 07/30/2016   Rectal fissure    Restless leg    Right carpal tunnel syndrome 09/01/2011   SVT (supraventricular tachycardia)    Syncope 02/25/2016   Thrombocytosis 04/11/2022   Urinary frequency    Urinary incontinence 10/23/2020   UTI (urinary tract infection) 04/15/2021  Varicose veins    Right medial thigh and Left leg    Past Surgical History:  Procedure Laterality Date   AMPUTATION Left 02/01/2018   Procedure: LEFT FOURTH AND 5TH TOE RAY AMPUTATION;  Surgeon: [name], MD;  Location: [place];  Service: Orthopedics;  Laterality: Left;   AMPUTATION Left 03/03/2018   Procedure: LEFT BELOW KNEE AMPUTATION;  Surgeon: [name], MD;  Location: [place];  Service: Orthopedics;  Laterality: Left;   AMPUTATION Right 05/21/2022   Procedure: REVISION AMPUTATION RAY 1ST;  Surgeon: [name], DPM;  Location: [place];  Service: Podiatry;  Laterality: Right;   CARPAL TUNNEL RELEASE Bilateral    CESAREAN SECTION  2007   CORONARY ANGIOGRAPHY N/A 11/26/2020    Procedure: CORONARY ANGIOGRAPHY;  Surgeon: Sherren Mocha, MD;  Location: [place] [facility];  Service: Cardiovascular;  Laterality: N/A;   FOOT AMPUTATION Right    INCISION AND DRAINAGE ABSCESS Left 10/16/2021   Procedure: INCISION AND DRAINAGE ABSCESS;  Surgeon: Dwan Bolt, MD;  Location: [place];  Service: General;  Laterality: Left;   INCISION AND DRAINAGE PERIRECTAL ABSCESS Left 05/18/2019   Procedure: IRRIGATION AND DEBRIDEMENT OF PANNIS ABSCESS, POSSIBLE DEBRIDEMENT OF BUTTOCK WOUND;  Surgeon: Donnie Mesa, MD;  Location: [facility];  Service: General;  Laterality: Left;   IRRIGATION AND DEBRIDEMENT BUTTOCKS Left 05/17/2019   Procedure: IRRIGATION AND DEBRIDEMENT BUTTOCKS;  Surgeon: Donnie Mesa, MD;  Location: [facility];  Service: General;  Laterality: Left;   KNEE ARTHROSCOPY Right 07/17/2010   LEFT HEART CATHETERIZATION WITH CORONARY ANGIOGRAM N/A 07/27/2012   Procedure: LEFT HEART CATHETERIZATION WITH CORONARY ANGIOGRAM;  Surgeon: Sherren Mocha, MD;  Location: [facility];  Service: Cardiovascular;  Laterality: N/A;   MASS EXCISION N/A 06/29/2013   Procedure:  WIDE LOCAL EXCISION OF POSTERIOR NECK ABSCESS;  Surgeon: Ralene Ok, MD;  Location: [place];  Service: General;  Laterality: N/A;   REPAIR KNEE LIGAMENT Left    "fixed ligaments and chipped patella"   right transmetatarsal amputation      TRACHEOSTOMY TUBE PLACEMENT N/A 07/10/2022   Procedure: CONTROLLED EXTUBATION;  Surgeon: Izora Gala, MD;  Location: [place];  Service: ENT;  Laterality: N/A;   Patient Active Problem List   Diagnosis Date Noted   Acute tubular necrosis ([place]) 07/12/2022   Hypernatremia 07/12/2022   Hypermagnesemia 07/12/2022   Gastrointestinal discomfort 07/12/2022   Pharyngeal abscess 07/05/2022   Ludwig's angina 07/05/2022   Neck infection    Cellulitis of right lower extremity 06/10/2022   Hypervolemia 06/01/2022   Hyperkalemia 05/23/2022   Peripheral artery disease, right leg, mild([name]) 05/15/2022    Osteomyelitis of foot, right, acute ([place]) 05/14/2022   Acute respiratory failure with hypoxia ([place]) 04/11/2022   Wound infection 04/09/2022   Debility 02/05/2022   Adrenal nodule ([place]) 01/07/2022   Iritis    Chronic kidney disease, stage 3a ([place]) 10/02/2535   Diastolic HF (heart failure) ([place]) 10/19/2021   Impaired mobility 07/07/2021   Chest pain 05/11/2021   Chronic pain 04/15/2021   Hyperlipidemia associated with type 2 diabetes mellitus ([place]) 01/15/2021   Hypertension associated with type 2 diabetes mellitus ([place]) 01/15/2021   Nonobstructive atherosclerosis of coronary artery 11/26/2020   Urinary incontinence 10/23/2020   Chronic respiratory failure with hypoxia ([place]) 06/14/2020   Constipation 04/03/2020   Dysuria 06/14/2019   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[place]) 04/11/2019   S/P transmetatarsal amputation of foot, right ([place]) 01/25/2019   Allergic rhinitis 03/06/2018   Class 3 severe obesity due to  excess calories with serious comorbidity and body mass index (BMI) of 50.0 to 59.9 in adult [facility])    Gout of knee, Right 10/22/2016   Type 2 diabetes mellitus with diabetic neuropathy, unspecified ([place]) 07/14/2016   Vitamin D deficiency 09/05/2015   Recurrent candidiasis of vagina 09/05/2015   Varicose veins of leg with complications 31/51/7616   Restless leg syndrome 10/17/2014   Chronic sinusitis 07/18/2014   Insomnia 08/14/2013   Encounter for chronic pain management 06/30/2013   Chronic pain of both knees 09/01/2011   Insulin dependent type 2 diabetes mellitus ([place]) 05/22/2008   Obesity hypoventilation syndrome ([place]) 05/22/2008   Mood disorder ([place]) 05/22/2008   Obstructive sleep apnea (adult) (pediatric) 05/22/2008   Hypertension 05/22/2008   Asthma, chronic 05/22/2008   GERD 05/22/2008    ONSET DATE: 05/21/22 (DOS)  REFERRING DIAG: W73.710 (ICD-10-CM) - Status post transmetatarsal amputation of right foot ([name])   THERAPY DIAG:  Unsteadiness on feet  Muscle weakness  (generalized)  Other abnormalities of gait and mobility  Rationale for Evaluation and Treatment Rehabilitation  SUBJECTIVE:                                                                                                                                                                                              SUBJECTIVE STATEMENT: Pt has history of L foot amputation and 2x L BKA with last surgery in 2019 with Dr. Tobie Poet. Pt has had 4-5 surgeries on her R foot with last surgery done by Dr. Posey Pronto on 05/21/22. Pt needs new prosthesis on L leg and she is working with Gerald St[employer] at [employer]. She has an appointment this Thursday for casting with Gerald Stabs. She has to see her DM doctor who will authorize prescription for R toe fillers/spacers and she will get that done when she sees her this month on 09/21/22. She had cortisone shot in left ankle on 08/12/22 which helped a little with pain. Pt uses Oxygen PRN (she used it  Pt accompanied by:  personal aide  PERTINENT HISTORY: hx of super morbid obesity, uncontrolled diabetes, peripheral arterial disease with left leg BKA and right transmetatarsal amputations, obesity hypoventilation syndrome, and chronic diastolic heart failure,    PAIN:  Are you having pain? Yes: NPRS scale: 7/10 Pain location: bil knees, R foot, L foot phantom pain at night, lower back Pain description: chronic pain Aggravating factors: activities Relieving factors: rest  PRECAUTIONS: Fall  WEIGHT BEARING RESTRICTIONS No  FALLS: Has patient fallen in last 6 months? Yes. Number of falls 1  LIVING ENVIRONMENT: Lives with: lives with their daughter and personal aid 8 hr/day 7 days/week. Lives  in: House/apartment Stairs: No Has following equipment at home: Gilford Rile - 4 wheeled, Wheelchair (manual), shower chair, and bed side commode  PLOF: she needs help with bathing, dressing, cooking, cleaning, toiletting.  PATIENT GOALS walk more  OBJECTIVE:    COGNITION: Overall cognitive status:  Within functional limits for tasks assessed    LOWER EXTREMITY ROM:     Active  Right Eval Left Eval  Hip flexion    Hip extension    Hip abduction    Hip adduction    Hip internal rotation    Hip external rotation    Knee flexion    Knee extension    Ankle dorsiflexion    Ankle plantarflexion    Ankle inversion    Ankle eversion     (Blank rows = not tested)  LOWER EXTREMITY MMT:    MMT Right Eval Left Eval  Hip flexion    Hip extension    Hip abduction    Hip adduction    Hip internal rotation    Hip external rotation    Knee flexion    Knee extension    Ankle dorsiflexion    Ankle plantarflexion    Ankle inversion    Ankle eversion    (Blank rows = not tested)  BED MOBILITY:  Has bed rail at home and able to do it I majority of the time and may need someones's help intermittently  TRANSFERS: Assistive device utilized: Environmental consultant - 2 wheeled  Sit to stand: CGA Stand to sit: CGA Chair to chair: CGA    GAIT: Gait pattern:  decreased cadence, decreased step length, increased WB throught UE on walker, drop foot on R, decreased foot clearance bil Distance walked: 20 feet Assistive device utilized: Environmental consultant - 2 wheeled Level of assistance: CGA   FUNCTIONAL TESTs:  Timed up and go (TUG): 29.25 sec with Bariatric RW 10 meter walk test: 0.32 m/s with bariatric RW    TODAY'S TREATMENT:  none   PATIENT EDUCATION: Education details: Reveiwed therapy goals, reviewed benefit of getting foot fillers and proper shoes to avoid excessive pressure through bottom of foot and prevent any skin breakdown Person educated: Patient and Caregiver   Education method: Explanation Education comprehension: verbalized understanding   HOME EXERCISE PROGRAM: TBD    GOALS: Goals reviewed with patient? Yes  SHORT TERM GOALS: Target date: 10/05/2022  Pt will be able to ambulate 50 feet with RW and CGA to improve household ambulation Baseline:20 feet with RW  (09/07/22) Goal status: INITIAL   LONG TERM GOALS: Target date: 11/16/2022  Pt will be able to ambulate 115' with RW and SBA to improve household ambulation Baseline: 20 feet with CGA and RW (09/07/22) Goal status: INITIAL  2.  Pt will demo TUG with RW <20 sec to improve functional mobility Baseline: 29.25 sec with RW (09/07/22) Goal status: INITIAL  3.  Pt will demo gait speed of 0.40 m/s or better with RW to reduce fall risk Baseline: 0.32 m/s with RW (09/07/22) Goal status: INITIAL   ASSESSMENT:  CLINICAL IMPRESSION: Patient is a 46 y.o. female who was seen today for physical therapy evaluation and treatment for gait and mobility disorder after R foot amputation and history of L BKA. Patient demonstrates significantly impaired gait and mobility per gait speed and timed up and go test. Patient also demonstrates increased risk for fall due to slower gait speed. Patient will benefit from skilled PT    OBJECTIVE IMPAIRMENTS Abnormal gait, cardiopulmonary status limiting activity, decreased activity  tolerance, decreased balance, decreased endurance, decreased mobility, difficulty walking, decreased ROM, decreased strength, hypomobility, increased edema, increased fascial restrictions, impaired flexibility, impaired sensation, impaired tone, prosthetic dependency , obesity, and pain.   ACTIVITY LIMITATIONS carrying, lifting, bending, standing, squatting, stairs, transfers, bathing, toileting, dressing, hygiene/grooming, and caring for others  PARTICIPATION LIMITATIONS: meal prep, cleaning, laundry, shopping, and community activity  PERSONAL FACTORS Past/current experiences, Time since onset of injury/illness/exacerbation, and 3+ comorbidities: DM, hx of L BKA, R foot amputation, decreased cardiopulmonary endurance and need for PRN supplemental O2  are also affecting patient's functional outcome.   REHAB POTENTIAL: Fair comorbidites  CLINICAL DECISION MAKING:  Stable/uncomplicated  EVALUATION COMPLEXITY: Low  PLAN: PT FREQUENCY: 1x/week  PT DURATION: 10 weeks (8 sessions)  PLANNED INTERVENTIONS: Therapeutic exercises, Therapeutic activity, Neuromuscular re-education, Balance training, Gait training, Patient/Family education, Self Care, Joint mobilization, Stair training, Orthotic/Fit training, Prosthetic training, Wheelchair mobility training, Cryotherapy, Moist heat, Compression bandaging, Manual therapy, and Re-evaluation  PLAN FOR NEXT SESSION: Measure O2 stats, work on functional mobility training, static balance exercises, issue HEP   Kerrie Pleasure, PT 09/07/2022, 2:28 PM  Managed medicaid CPT codes: [phone] - PT Re-evaluation, 97110- Therapeutic Exercise, [phone]- Neuro Re-education, [phone] - Gait Training, [phone] - Manual Therapy, [phone] - Therapeutic Activities, [phone] - [place], and [id number] - Prosthetic training

## 2022-09-09 ENCOUNTER — Encounter: Payer: Medicaid Other | Admitting: Obstetrics and Gynecology

## 2022-09-14 ENCOUNTER — Encounter: Payer: Self-pay | Admitting: Rehabilitation

## 2022-09-14 ENCOUNTER — Ambulatory Visit: Payer: Commercial Managed Care - HMO | Admitting: Rehabilitation

## 2022-09-14 DIAGNOSIS — R2689 Other abnormalities of gait and mobility: Secondary | ICD-10-CM

## 2022-09-14 DIAGNOSIS — R2681 Unsteadiness on feet: Secondary | ICD-10-CM | POA: Diagnosis not present

## 2022-09-14 DIAGNOSIS — M6281 Muscle weakness (generalized): Secondary | ICD-10-CM

## 2022-09-14 NOTE — Therapy (Signed)
OUTPATIENT PHYSICAL THERAPY NEURO TREATMENT   Patient Name: [name] MRN: [id number] DOB:18-Sep-1976, 46 y.o., female Today's Date: 09/14/2022   PCP: Dr. [name] REFERRING PROVIDER: Dr. Boneta Lucks   PT End of Session - 09/14/22 1217     Visit Number 2    Number of Visits 8    Date for PT Re-Evaluation 11/16/22    Authorization Type Medicaid (eval + 3 visits, need recert at visit 4)    Authorization - Visit Number 1    Authorization - Number of Visits 3    Progress Note Due on Visit 3    PT Start Time 1111    PT Stop Time 1155    PT Time Calculation (min) 44 min    Equipment Utilized During Treatment Gait belt    Activity Tolerance Patient tolerated treatment well    Behavior During Therapy WFL for tasks assessed/performed              Past Medical History:  Diagnosis Date   Abscess of groin, left    Acute on chronic diastolic (congestive) heart failure ([place]) 02/05/2022   Acute on chronic respiratory failure with hypoxia ([place]) 04/11/2022   Acute osteomyelitis of ankle or foot, left ([place]) 01/31/2018   Acute respiratory failure with hypoxia and hypercarbia ([place]) 06/15/2020   Alveolar hypoventilation    Amputation stump infection ([place]) 04/09/2018   Anemia    not on iron pill   Asthma    Bipolar 2 disorder ([name])    Candidiasis of vagina 05/15/2022   Carpal tunnel syndrome on right    recurrent   Cellulitis 08/2010-08/2011   Chest pain with low risk for cardiac etiology 05/11/2021   Chronic pain    COPD (chronic obstructive pulmonary disease) ([name])    Symbicort daily and Proventil as needed   Costochondritis    De Quervain's tenosynovitis, bilateral 11/01/2015   Depression    Diabetes mellitus type II, uncontrolled 2000   Type 2, Uncontrolled.Takes Lantus daily.Fasting blood sugar runs 150   Diabetic ulcer of right foot ([place]) 12/19/2021   Drug-seeking behavior    Elevated troponin    Exposure to mold 04/21/2020   GERD (gastroesophageal reflux disease)     takes Pantoprazole and Zantac daily   HLD (hyperlipidemia)    takes Atorvastatin daily   Hypertension    takes Lisinopril and Coreg daily   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[place]) 04/11/2019   Left shoulder pain 06/23/2019   Morbid obesity ([place])    Morbid obesity with BMI of 60.0-69.9, adult ([place]) 04/15/2021   Nocturia    Nonobstructive atherosclerosis of coronary artery 11/26/2020   11/26/20 Coronary angiogram: 1.  Mild diffuse proximal LAD stenosis with no evidence of obstructive disease (20 to 30% diffuse stenosis);  2.  Left dominant circumflex with widely patent obtuse marginal branches and left PDA branch, no significant stenoses present  3.  Nondominant RCA with mild diffuse nonobstructive stenosis    OSA on CPAP    Peripheral neuropathy    takes Gabapentin daily   Pneumonia    "walking" several yrs ago and as a baby (12/05/2018)   Pneumonia due to COVID-19 virus 04/14/2021   Primary osteoarthritis of first carpometacarpal joint of left hand 07/30/2016   Rectal fissure    Restless leg    Right carpal tunnel syndrome 09/01/2011   SVT (supraventricular tachycardia)    Syncope 02/25/2016   Thrombocytosis 04/11/2022   Urinary frequency    Urinary incontinence 10/23/2020   UTI (urinary tract infection)  04/15/2021   Varicose veins    Right medial thigh and Left leg    Past Surgical History:  Procedure Laterality Date   AMPUTATION Left 02/01/2018   Procedure: LEFT FOURTH AND 5TH TOE RAY AMPUTATION;  Surgeon: [name], MD;  Location: [facility];  Service: Orthopedics;  Laterality: Left;   AMPUTATION Left 03/03/2018   Procedure: LEFT BELOW KNEE AMPUTATION;  Surgeon: [name], MD;  Location: [facility];  Service: Orthopedics;  Laterality: Left;   AMPUTATION Right 05/21/2022   Procedure: REVISION AMPUTATION RAY 1ST;  Surgeon: [name], DPM;  Location: [facility];  Service: Podiatry;  Laterality: Right;   CARPAL TUNNEL RELEASE Bilateral    CESAREAN SECTION  2007   CORONARY ANGIOGRAPHY N/A  11/26/2020   Procedure: CORONARY ANGIOGRAPHY;  Surgeon: Sherren Mocha, MD;  Location: [facility];  Service: Cardiovascular;  Laterality: N/A;   FOOT AMPUTATION Right    INCISION AND DRAINAGE ABSCESS Left 10/16/2021   Procedure: INCISION AND DRAINAGE ABSCESS;  Surgeon: Dwan Bolt, MD;  Location: [facility];  Service: General;  Laterality: Left;   INCISION AND DRAINAGE PERIRECTAL ABSCESS Left 05/18/2019   Procedure: IRRIGATION AND DEBRIDEMENT OF PANNIS ABSCESS, POSSIBLE DEBRIDEMENT OF BUTTOCK WOUND;  Surgeon: Donnie Mesa, MD;  Location: [facility];  Service: General;  Laterality: Left;   IRRIGATION AND DEBRIDEMENT BUTTOCKS Left 05/17/2019   Procedure: IRRIGATION AND DEBRIDEMENT BUTTOCKS;  Surgeon: Donnie Mesa, MD;  Location: [facility];  Service: General;  Laterality: Left;   KNEE ARTHROSCOPY Right 07/17/2010   LEFT HEART CATHETERIZATION WITH CORONARY ANGIOGRAM N/A 07/27/2012   Procedure: LEFT HEART CATHETERIZATION WITH CORONARY ANGIOGRAM;  Surgeon: Sherren Mocha, MD;  Location: [facility];  Service: Cardiovascular;  Laterality: N/A;   MASS EXCISION N/A 06/29/2013   Procedure:  WIDE LOCAL EXCISION OF POSTERIOR NECK ABSCESS;  Surgeon: Ralene Ok, MD;  Location: [facility];  Service: General;  Laterality: N/A;   REPAIR KNEE LIGAMENT Left    "fixed ligaments and chipped patella"   right transmetatarsal amputation      TRACHEOSTOMY TUBE PLACEMENT N/A 07/10/2022   Procedure: CONTROLLED EXTUBATION;  Surgeon: Izora Gala, MD;  Location: [facility];  Service: ENT;  Laterality: N/A;   Patient Active Problem List   Diagnosis Date Noted   Acute tubular necrosis ([place]) 07/12/2022   Hypernatremia 07/12/2022   Hypermagnesemia 07/12/2022   Gastrointestinal discomfort 07/12/2022   Pharyngeal abscess 07/05/2022   Ludwig's angina 07/05/2022   Neck infection    Cellulitis of right lower extremity 06/10/2022   Hypervolemia 06/01/2022   Hyperkalemia 05/23/2022   Peripheral artery disease, right leg,  mild([name]) 05/15/2022   Osteomyelitis of foot, right, acute ([place]) 05/14/2022   Acute respiratory failure with hypoxia ([place]) 04/11/2022   Wound infection 04/09/2022   Debility 02/05/2022   Adrenal nodule ([place]) 01/07/2022   Iritis    Chronic kidney disease, stage 3a ([place]) 31/49/7026   Diastolic HF (heart failure) ([place]) 10/19/2021   Impaired mobility 07/07/2021   Chest pain 05/11/2021   Chronic pain 04/15/2021   Hyperlipidemia associated with type 2 diabetes mellitus ([place]) 01/15/2021   Hypertension associated with type 2 diabetes mellitus ([place]) 01/15/2021   Nonobstructive atherosclerosis of coronary artery 11/26/2020   Urinary incontinence 10/23/2020   Chronic respiratory failure with hypoxia ([place]) 06/14/2020   Constipation 04/03/2020   Dysuria 06/14/2019   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[place]) 04/11/2019   S/P transmetatarsal amputation of foot, right ([place]) 01/25/2019   Allergic rhinitis 03/06/2018   Class 3 severe  obesity due to excess calories with serious comorbidity and body mass index (BMI) of 50.0 to 59.9 in adult [facility])    Gout of knee, Right 10/22/2016   Type 2 diabetes mellitus with diabetic neuropathy, unspecified ([place]) 07/14/2016   Vitamin D deficiency 09/05/2015   Recurrent candidiasis of vagina 09/05/2015   Varicose veins of leg with complications 05/31/9484   Restless leg syndrome 10/17/2014   Chronic sinusitis 07/18/2014   Insomnia 08/14/2013   Encounter for chronic pain management 06/30/2013   Chronic pain of both knees 09/01/2011   Insulin dependent type 2 diabetes mellitus ([place]) 05/22/2008   Obesity hypoventilation syndrome ([place]) 05/22/2008   Mood disorder ([place]) 05/22/2008   Obstructive sleep apnea (adult) (pediatric) 05/22/2008   Hypertension 05/22/2008   Asthma, chronic 05/22/2008   GERD 05/22/2008    ONSET DATE: 05/21/22 (DOS)  REFERRING DIAG: I62.703 (ICD-10-CM) - Status post transmetatarsal amputation of right foot ([name])   THERAPY DIAG:  Unsteadiness on  feet  Muscle weakness (generalized)  Other abnormalities of gait and mobility  Rationale for Evaluation and Treatment Rehabilitation  SUBJECTIVE:                                                                                                                                                                                              SUBJECTIVE STATEMENT:  No changes since last session.   Pt accompanied by:  personal aide  PERTINENT HISTORY: hx of super morbid obesity, uncontrolled diabetes, peripheral arterial disease with left leg BKA and right transmetatarsal amputations, obesity hypoventilation syndrome, and chronic diastolic heart failure,    PAIN:  Are you having pain? Yes: NPRS scale: 7/10 Pain location: bil knees, R foot, L foot phantom pain at night, lower back Pain description: chronic pain Aggravating factors: activities Relieving factors: rest  PRECAUTIONS: Fall  WEIGHT BEARING RESTRICTIONS No  FALLS: Has patient fallen in last 6 months? Yes. Number of falls 1  LIVING ENVIRONMENT: Lives with: lives with their daughter and personal aid 8 hr/day m-f, 5 HOURS ON WEEKEND Lives in: House/apartment Stairs: No Has following equipment at home: Gilford Rile - 4 wheeled, Wheelchair (manual), shower chair, and bed side commode  PLOF: she needs help with bathing, dressing, cooking, cleaning, toiletting.  PATIENT GOALS walk more  OBJECTIVE:      TODAY'S TREATMENT:    GAIT: Gait pattern: step through pattern, decreased arm swing- Right, decreased arm swing- Left, decreased stride length, decreased ankle dorsiflexion- Right, Right foot flat, lateral hip instability, and wide BOS Distance walked: 36' then 64' with bari RW Assistive device utilized:  bari RW Level of assistance: CGA and Min A  Comments: Pt able to ambulate 2 bouts during session with w/c follow for safety distances listed above.  Min cues for posture and continued diaphragmatic breathing throughout.  SaO2  never dropped below 89% during all mobility.  Pt strongly encouraged to ambulate several small bouts at home daily as she is only ambulating once from bed room to living room.  Discussed that endurance is biggest limiting factor and she needs to increase activity at home.  Pt and caregiver verbalized understanding.   Therex:  See HEP details below for exercises and reps performed.  Did not perform side stepping as she is moving into hotel room on Friday but hopefully will have counter or entertainment stand to side step along, she will let us know.     PATIENT EDUCATION: Education details: initial HEP  Person educated: Patient and Caregiver   Education method: Explanation Education comprehension: verbalized understanding   HOME EXERCISE PROGRAM: Access Code: [email or web address] URL: [email or web address] Date: 09/14/2022 Prepared by: Cameron Sprang  Exercises - Marching with Same-Side Arm Raise and Walker  - 1 x daily - 7 x weekly - 2 sets - 10 reps - Standing March with Counter Support  - 1 x daily - 7 x weekly - 1 sets - 20 reps - Side Stepping with Counter Support  - 1 x daily - 7 x weekly - 1 sets - 4 reps   GOALS: Goals reviewed with patient? Yes  SHORT TERM GOALS: Target date: 10/12/2022  Pt will be able to ambulate 50 feet with RW and CGA to improve household ambulation Baseline:20 feet with RW (09/07/22) Goal status: INITIAL  2.  Pt will participate in BERG balance test and updated LTG to be set.    Baseline: not performed yet  Goal Status: Initial     LONG TERM GOALS: Target date: 11/23/2022  Pt will be able to ambulate 115' with RW and SBA to improve household ambulation Baseline: 20 feet with CGA and RW (09/07/22) Goal status: INITIAL  2.  Pt will demo TUG with RW <20 sec to improve functional mobility Baseline: 29.25 sec with RW (09/07/22) Goal status: INITIAL  3.  Pt will demo gait speed of 0.40 m/s or better with RW to reduce fall risk Baseline: 0.32 m/s  with RW (09/07/22) Goal status: INITIAL   ASSESSMENT:  CLINICAL IMPRESSION: Skilled session focused on gait with bari RW for improved distance and quality along with initiating HEP.  Pt is moving from apt to hotel room this Friday but should still have room to perform several small bouts of gait and HEP exercises in room.  She is getting new L prosthesis hopefully in 3-4 weeks and is getting prescription for R toe filler next week when at [facility].     OBJECTIVE IMPAIRMENTS Abnormal gait, cardiopulmonary status limiting activity, decreased activity tolerance, decreased balance, decreased endurance, decreased mobility, difficulty walking, decreased ROM, decreased strength, hypomobility, increased edema, increased fascial restrictions, impaired flexibility, impaired sensation, impaired tone, prosthetic dependency , obesity, and pain.   ACTIVITY LIMITATIONS carrying, lifting, bending, standing, squatting, stairs, transfers, bathing, toileting, dressing, hygiene/grooming, and caring for others  PARTICIPATION LIMITATIONS: meal prep, cleaning, laundry, shopping, and community activity  PERSONAL FACTORS Past/current experiences, Time since onset of injury/illness/exacerbation, and 3+ comorbidities: DM, hx of L BKA, R foot amputation, decreased cardiopulmonary endurance and need for PRN supplemental O2  are also affecting patient's functional outcome.   REHAB POTENTIAL: Fair comorbidites  CLINICAL DECISION MAKING: Stable/uncomplicated  EVALUATION COMPLEXITY: Low  PLAN: PT FREQUENCY: 1x/week  PT DURATION: 10 weeks (8 sessions)  PLANNED INTERVENTIONS: Therapeutic exercises, Therapeutic activity, Neuromuscular re-education, Balance training, Gait training, Patient/Family education, Self Care, Joint mobilization, Stair training, Orthotic/Fit training, Prosthetic training, Wheelchair mobility training, Cryotherapy, Moist heat, Compression bandaging, Manual therapy, and Re-evaluation  PLAN FOR NEXT  SESSION: step taps, standing balance (functional), Measure O2 sats, work on functional mobility training, static balance exercises, Add to [place], PT, MPT [facility] [address] [place], [place], [address] Phone: [phone]   Fax:  [phone] 09/14/22, 12:18 PM

## 2022-09-15 ENCOUNTER — Other Ambulatory Visit: Payer: Self-pay | Admitting: Family Medicine

## 2022-09-16 LAB — HM DIABETES EYE EXAM

## 2022-09-17 ENCOUNTER — Other Ambulatory Visit: Payer: Medicaid Other

## 2022-09-20 ENCOUNTER — Other Ambulatory Visit: Payer: Medicaid Other

## 2022-09-20 ENCOUNTER — Encounter: Payer: Self-pay | Admitting: Dietician

## 2022-09-20 ENCOUNTER — Encounter: Payer: Commercial Managed Care - HMO | Attending: Family Medicine | Admitting: Dietician

## 2022-09-20 DIAGNOSIS — E1159 Type 2 diabetes mellitus with other circulatory complications: Secondary | ICD-10-CM | POA: Diagnosis not present

## 2022-09-20 DIAGNOSIS — E785 Hyperlipidemia, unspecified: Secondary | ICD-10-CM | POA: Insufficient documentation

## 2022-09-20 DIAGNOSIS — E1140 Type 2 diabetes mellitus with diabetic neuropathy, unspecified: Secondary | ICD-10-CM

## 2022-09-20 DIAGNOSIS — I152 Hypertension secondary to endocrine disorders: Secondary | ICD-10-CM | POA: Diagnosis not present

## 2022-09-20 DIAGNOSIS — Z713 Dietary counseling and surveillance: Secondary | ICD-10-CM | POA: Diagnosis present

## 2022-09-20 DIAGNOSIS — E1169 Type 2 diabetes mellitus with other specified complication: Secondary | ICD-10-CM | POA: Insufficient documentation

## 2022-09-20 NOTE — Progress Notes (Addendum)
Diabetes Self-Management Education  Visit Type: First/Initial  Appt. Start Time: 1115 Appt. End Time: 6945  09/20/2022  [name], identified by name and date of birth, is a 46 y.o. female with a diagnosis of Diabetes: Type 2.   ASSESSMENT Patient is here today with her caregiver Deidra.  She would like to learn more about what she should and should not eat. She states that she has no wounds She states that she needs to get her A1C down to 8% to qualify for weight loss surgery.  Referral reason:  HTN, HLD, Type 2 Diabetes, L BKA and no toes on the right.  She has been referred to Dr. Kelton Pillar and also sees an endocrinologist (Dr. Clemon Chambers) in [place] History includes:  Type 2 diabetes (2001), CHF, HTN, HLD, depression, morbid obesity, COPD, OSA with bipap, neuropathy, glaucoma (cannot see to read small print) Medications include:  Tresiba 60 units bid, Novolog 30 units every meal with sliding scale Labs:  A1C 14.9% 05/16/2022 and >15.5% 12/19/2021, Vitamin D 13 2018, vitamin B-12 787 in 2020 CGM:  Dexcom G6 Omnipod 5:  (used for correction and meal time insulin use only).  She does not have her PDM with her today.  Weight hx: 62" 328 lbs 09/02/2022 316 lbs 07/13/2022 344 lbs 06/10/2022  Patient lives with her daughter.  They share shopping and she does most of the cooking.  She uses a microwave, air fryer, cockpot, and electric skillets.  She has a medium size refrigerator.  They are currently in a motel.  She uses a wheelchair when out and a walker in the motel. When she moves to a handicapped apartment she will [place] an electric wheel chair. She gets food stamps but not adequate amounts.  She gets SSI.  She has filed for disability. She uses Medical transportation or [employer]. Her daughter is in [place].   Allergy to kiwi. Insulin and diabetes supplies are free currently. She has a blood glucose meter but does not use this. Balance exercises with PT. Eats on  saucers  Height '5\' 2"'$  (1.575 m), weight (!) 328 lb (148.8 kg). Body mass index is 59.99 kg/m.   Diabetes Self-Management Education - 09/20/22 1211       Visit Information   Visit Type First/Initial      Initial Visit   Diabetes Type Type 2    Date Diagnosed 2001    Are you currently following a meal plan? No    Are you taking your medications as prescribed? Yes      Health Coping   How would you rate your overall health? Good      Psychosocial Assessment   Patient Belief/Attitude about Diabetes Motivated to manage diabetes    What is the hardest part about your diabetes right now, causing you the most concern, or is the most worrisome to you about your diabetes?   Being active;Making healty food and beverage choices;Getting support / problem solving    Self-care barriers None    Self-management support Doctor's office;CDE visits;Family    Other persons present Patient;Other (comment)   caregiver   Patient Concerns Nutrition/Meal planning;Weight Control    Special Needs None    Preferred Learning Style No preference indicated    Learning Readiness Ready    How often do you need to have someone help you when you read instructions, pamphlets, or other written materials from your doctor or pharmacy? 1 - Never    What is the last grade level  you completed in school? 10      Pre-Education Assessment   Patient understands the diabetes disease and treatment process. Needs Review    Patient understands incorporating nutritional management into lifestyle. Needs Review    Patient undertands incorporating physical activity into lifestyle. Needs Review    Patient understands using medications safely. Needs Review    Patient understands monitoring blood glucose, interpreting and using results Needs Review    Patient understands prevention, detection, and treatment of acute complications. Needs Review    Patient understands prevention, detection, and treatment of chronic complications.  Needs Review    Patient understands how to develop strategies to address psychosocial issues. Needs Review    Patient understands how to develop strategies to promote health/change behavior. Needs Review      Complications   Last HgB A1C per patient/outside source 14.9 %   05/16/2022   How often do you check your blood sugar? > 4 times/day    Fasting Blood glucose range (mg/dL) >200;180-200   180-220   Postprandial Blood glucose range (mg/dL) >200;180-200    Number of hypoglycemic episodes per month 0    Number of hyperglycemic episodes ( >'200mg'$ /dL): Daily    Can you tell when your blood sugar is high? Yes    What do you do if your blood sugar is high? takes more insulin through the pump    Have you had a dilated eye exam in the past 12 months? Yes    Have you had a dental exam in the past 12 months? Yes    Are you checking your feet? Yes    How many days per week are you checking your feet? 7      Dietary Intake   Breakfast ham sandwich with lettuce and musterd on white bread    Snack (morning) none    Lunch skips OR sandwich OR soup OR salad    Snack (afternoon) none    Dinner 2 hamburgers with lettuce and mustard on buns OR chicken, creamed spinach, chicken fried rice    Snack (evening) none    Beverage(s) water, 16 oz regular Dr. Malachi Bonds occasionally, tea with splenda, coffee with splenda, creamer      Activity / Exercise   Activity / Exercise Type Light (walking / raking leaves)    How many days per week do you exercise? 1    How many minutes per day do you exercise? 10    Total minutes per week of exercise 10      Patient Education   Previous Diabetes Education Yes (please comment)   unknown   Healthy Eating Role of diet in the treatment of diabetes and the relationship between the three main macronutrients and blood glucose level;Meal options for control of blood glucose level and chronic complications.;Other (comment)   low sodium   Medications Reviewed patients medication  for diabetes, action, purpose, timing of dose and side effects.    Monitoring Taught/evaluated CGM (comment)   signed up for sharing with [facility] but unable to obtain the report   Lifestyle and Health Coping Lifestyle issues that need to be addressed for better diabetes care      Individualized Goals (developed by patient)   Nutrition General guidelines for healthy choices and portions discussed    Physical Activity Not Applicable    Medications take my medication as prescribed    Monitoring  Consistenly use CGM    Problem Solving Eating Pattern;Addressing barriers to behavior change  Post-Education Assessment   Patient understands the diabetes disease and treatment process. Needs Review    Patient understands incorporating nutritional management into lifestyle. Needs Review    Patient undertands incorporating physical activity into lifestyle. Needs Review    Patient understands using medications safely. Comphrehends key points    Patient understands monitoring blood glucose, interpreting and using results Needs Review    Patient understands prevention, detection, and treatment of acute complications. Needs Review    Patient understands prevention, detection, and treatment of chronic complications. Needs Review    Patient understands how to develop strategies to address psychosocial issues. Needs Review    Patient understands how to develop strategies to promote health/change behavior. Needs Review      Outcomes   Expected Outcomes Demonstrated interest in learning but significant barriers to change    Future DMSE 2 months    Program Status Not Completed             Individualized Plan for Diabetes Self-Management Training:   Learning Objective:  Patient will have a greater understanding of diabetes self-management. Patient education plan is to attend individual and/or group sessions per assessed needs and concerns.   Plan:   Patient Instructions  Drink water. Avoid  soda and juice. Fresh meat (air fried) rather than deli meat/ham/bacon Read labels to find items that are lower in sodium Continue to rinse canned food Swiss cheese is a low sodium cheese   Easy meal ideas:  [place] yogurt, unsalted nuts, fruit  Sandwich, fruit  Salad with rinsed beans or meat and fruit  2 eggs, 1/2 cup grits, 1 fruit   Homemade soup, fresh fruit  Expected Outcomes:  Demonstrated interest in learning but significant barriers to change  Education material provided: Meal plan card, My Plate, and Snack sheet, food pantry and free meal list  If problems or questions, patient to contact team via:  Phone  Future DSME appointment: 2 months

## 2022-09-20 NOTE — Patient Instructions (Addendum)
Drink water. Avoid soda and juice. Fresh meat (air fried) rather than deli meat/ham/bacon Read labels to find items that are lower in sodium Continue to rinse canned food Swiss cheese is a low sodium cheese   Easy meal ideas:  [place] yogurt, unsalted nuts, fruit  Sandwich, fruit  Salad with rinsed beans or meat and fruit  2 eggs, 1/2 cup grits, 1 fruit   Homemade soup, fresh fruit

## 2022-09-21 ENCOUNTER — Ambulatory Visit: Payer: Medicaid Other | Admitting: Family Medicine

## 2022-09-23 ENCOUNTER — Ambulatory Visit: Payer: Commercial Managed Care - HMO | Admitting: Rehabilitation

## 2022-09-23 ENCOUNTER — Other Ambulatory Visit: Payer: Self-pay | Admitting: Family Medicine

## 2022-09-24 ENCOUNTER — Encounter: Payer: Self-pay | Admitting: Internal Medicine

## 2022-09-24 ENCOUNTER — Ambulatory Visit (INDEPENDENT_AMBULATORY_CARE_PROVIDER_SITE_OTHER): Payer: Medicaid Other | Admitting: Internal Medicine

## 2022-09-24 VITALS — BP 120/80 | HR 93 | Ht 62.0 in | Wt 343.0 lb

## 2022-09-24 DIAGNOSIS — G4733 Obstructive sleep apnea (adult) (pediatric): Secondary | ICD-10-CM | POA: Diagnosis not present

## 2022-09-24 DIAGNOSIS — J9612 Chronic respiratory failure with hypercapnia: Secondary | ICD-10-CM | POA: Diagnosis not present

## 2022-09-24 DIAGNOSIS — J9611 Chronic respiratory failure with hypoxia: Secondary | ICD-10-CM

## 2022-09-24 DIAGNOSIS — I5032 Chronic diastolic (congestive) heart failure: Secondary | ICD-10-CM | POA: Diagnosis not present

## 2022-09-24 MED ORDER — IPRATROPIUM-ALBUTEROL 0.5-2.5 (3) MG/3ML IN SOLN: 3.0000 mL | Freq: Four times a day (QID) | RESPIRATORY_TRACT | 11 refills | Status: DC | PRN

## 2022-09-24 NOTE — Patient Instructions (Signed)
Please schedule follow up scheduled with myself in 1 year.  If my schedule is not open yet, we will contact you with a reminder closer to that time. Please call [phone] if you haven't heard from [place] a month before.   Before your next visit I would like you to have: Walk test with physical therapy for oxygen. They can send [place] the results so we can prescribe your oxygen concentrator.   Continue trilogy home vent.  Continue symbicort.  Stop spiriva. Will change to duonebs. Will prescribe Nebulizer machine.

## 2022-09-24 NOTE — Progress Notes (Signed)
[name]    [id number]    [birth year]  Primary Care Physician:McIntyre, [name], MD Date of Appointment: 09/24/2022 Established Patient Visit  Chief complaint:   Chief Complaint  Patient presents with   Follow-up     HPI: [name] is a 46 y.o. woman with super morbid obesity Body mass index is 62.74 kg/m., OSA/OHS and chronic respiratory failure.  Interval Updates: Here for follow up for OHS/OSA and chronic respiratory failure, annual follow up.  Had some issues with chronic osteomyelitis - records reviewed at [facility]. Infection resolved.   Has been having low oxygen levels to 87-88% while not on oxygen.  Only wearing oxygen PRN. 94% on room air today.  Wearing vent at night, no issues with that.  Current inhaler therapy - spiriva and symbicort. Was on duoneb in the hospital and she felt this helped her more. Would like to switch to this instead of albuterol, does not have nebulizer machine at home.   Trilogy download - requested from [employer].  Feels torsemide 80 mg working well to keep her fluid off.   I have reviewed the patient's family social and past medical history and updated as appropriate.   Past Medical History:  Diagnosis Date   Abscess of groin, left    Acute on chronic diastolic (congestive) heart failure ([place]) 02/05/2022   Acute on chronic respiratory failure with hypoxia ([name]) 04/11/2022   Acute osteomyelitis of ankle or foot, left ([place]) 01/31/2018   Acute respiratory failure with hypoxia and hypercarbia ([place]) 06/15/2020   Alveolar hypoventilation    Amputation stump infection ([place]) 04/09/2018   Anemia    not on iron pill   Asthma    Bipolar 2 disorder ([name])    Candidiasis of vagina 05/15/2022   Carpal tunnel syndrome on right    recurrent   Cellulitis 08/2010-08/2011   Chest pain with low risk for cardiac etiology 05/11/2021   Chronic pain    COPD (chronic obstructive pulmonary disease) ([name])    Symbicort daily and Proventil as needed    Costochondritis    De Quervain's tenosynovitis, bilateral 11/01/2015   Depression    Diabetes mellitus type II, uncontrolled 2000   Type 2, Uncontrolled.Takes Lantus daily.Fasting blood sugar runs 150   Diabetic ulcer of right foot ([place]) 12/19/2021   Drug-seeking behavior    Elevated troponin    Exposure to mold 04/21/2020   GERD (gastroesophageal reflux disease)    takes Pantoprazole and Zantac daily   HLD (hyperlipidemia)    takes Atorvastatin daily   Hypertension    takes Lisinopril and Coreg daily   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[place]) 04/11/2019   Left shoulder pain 06/23/2019   Morbid obesity ([place])    Morbid obesity with BMI of 60.0-69.9, adult ([place]) 04/15/2021   Nocturia    Nonobstructive atherosclerosis of coronary artery 11/26/2020   11/26/20 Coronary angiogram: 1.  Mild diffuse proximal LAD stenosis with no evidence of obstructive disease (20 to 30% diffuse stenosis);  2.  Left dominant circumflex with widely patent obtuse marginal branches and left PDA branch, no significant stenoses present  3.  Nondominant RCA with mild diffuse nonobstructive stenosis    OSA on CPAP    Peripheral neuropathy    takes Gabapentin daily   Pneumonia    "walking" several yrs ago and as a baby (12/05/2018)   Pneumonia due to COVID-19 virus 04/14/2021   Primary osteoarthritis of first carpometacarpal joint of left hand 07/30/2016   Rectal  fissure    Restless leg    Right carpal tunnel syndrome 09/01/2011   SVT (supraventricular tachycardia)    Syncope 02/25/2016   Thrombocytosis 04/11/2022   Urinary frequency    Urinary incontinence 10/23/2020   UTI (urinary tract infection) 04/15/2021   Varicose veins    Right medial thigh and Left leg     Past Surgical History:  Procedure Laterality Date   AMPUTATION Left 02/01/2018   Procedure: LEFT FOURTH AND 5TH TOE RAY AMPUTATION;  Surgeon: [name], MD;  Location: [facility] [place];  Service: Orthopedics;  Laterality: Left;   AMPUTATION Left 03/03/2018   Procedure:  LEFT BELOW KNEE AMPUTATION;  Surgeon: [name], MD;  Location: [facility];  Service: Orthopedics;  Laterality: Left;   AMPUTATION Right 05/21/2022   Procedure: REVISION AMPUTATION RAY 1ST;  Surgeon: [name], DPM;  Location: [facility];  Service: Podiatry;  Laterality: Right;   CARPAL TUNNEL RELEASE Bilateral    CESAREAN SECTION  2007   CORONARY ANGIOGRAPHY N/A 11/26/2020   Procedure: CORONARY ANGIOGRAPHY;  Surgeon: Sherren Mocha, MD;  Location: [facility];  Service: Cardiovascular;  Laterality: N/A;   FOOT AMPUTATION Right    INCISION AND DRAINAGE ABSCESS Left 10/16/2021   Procedure: INCISION AND DRAINAGE ABSCESS;  Surgeon: Dwan Bolt, MD;  Location: [facility];  Service: General;  Laterality: Left;   INCISION AND DRAINAGE PERIRECTAL ABSCESS Left 05/18/2019   Procedure: IRRIGATION AND DEBRIDEMENT OF PANNIS ABSCESS, POSSIBLE DEBRIDEMENT OF BUTTOCK WOUND;  Surgeon: Donnie Mesa, MD;  Location: [facility];  Service: General;  Laterality: Left;   IRRIGATION AND DEBRIDEMENT BUTTOCKS Left 05/17/2019   Procedure: IRRIGATION AND DEBRIDEMENT BUTTOCKS;  Surgeon: Donnie Mesa, MD;  Location: [facility];  Service: General;  Laterality: Left;   KNEE ARTHROSCOPY Right 07/17/2010   LEFT HEART CATHETERIZATION WITH CORONARY ANGIOGRAM N/A 07/27/2012   Procedure: LEFT HEART CATHETERIZATION WITH CORONARY ANGIOGRAM;  Surgeon: Sherren Mocha, MD;  Location[facility];  Service: Cardiovascular;  Laterality: N/A;   MASS EXCISION N/A 06/29/2013   Procedure:  WIDE LOCAL EXCISION OF POSTERIOR NECK ABSCESS;  Surgeon: Ralene Ok, MD;  Location: [facility];  Service: General;  Laterality: N/A;   REPAIR KNEE LIGAMENT Left    "fixed ligaments and chipped patella"   right transmetatarsal amputation      TRACHEOSTOMY TUBE PLACEMENT N/A 07/10/2022   Procedure: CONTROLLED EXTUBATION;  Surgeon: Izora Gala, MD;  Location: [facility];  Service: ENT;  Laterality: N/A;    Family History  Problem Relation Age of Onset    Diabetes Mother    Hyperlipidemia Mother    Depression Mother    GER disease Mother    Allergic rhinitis Mother    Restless legs syndrome Mother    Heart attack Paternal Uncle    Heart disease Paternal Grandmother    Heart attack Paternal Grandmother    Heart attack Paternal Grandfather    Heart disease Paternal Grandfather    Heart attack Father    Migraines Sister    Cancer Maternal Grandmother        COLON   Hypertension Maternal Grandmother    Hyperlipidemia Maternal Grandmother    Diabetes Maternal Grandmother    Other Maternal Grandfather        GUN SHOT   Anxiety disorder Sister    Asthma Child     Social History   Occupational History   Not on file  Tobacco Use   Smoking status: Former    Packs/day: 0.25  Years: 15.00    Total pack years: 3.75    Types: Cigarettes    Quit date: 12/06/2005    Years since quitting: 16.8   Smokeless tobacco: Never   Tobacco comments:    smokes for a couple of months  Vaping Use   Vaping Use: Never used  Substance and Sexual Activity   Alcohol use: No    Alcohol/week: 0.0 standard drinks of alcohol   Drug use: Not Currently    Comment: OD attempts on home meds     Sexual activity: Yes    Partners: Male     Physical Exam: Blood pressure 120/80, pulse 93, height '5\' 2"'$  (1.575 m), weight (!) 343 lb (155.6 kg), SpO2 94 %.  Gen:     NAD, morbidly obese, in wheelchair Lungs:    diminished, clear.  CV:         diminished, RRR Ext: S/p right metatarsal amputation   Data Reviewed: Imaging: I have personally reviewed the chest xray May 2022 from the ED - low lung volumes, no acute process   PFTs:      No data to display         None on file  Labs: Lab Results  Component Value Date   WBC 11.2 (H) 08/15/2022   HGB 11.8 (L) 08/15/2022   HCT 36.2 08/15/2022   MCV 93.3 08/15/2022   PLT 469 (H) 08/15/2022   ABG shows hypercapnia and hypoxemia  Immunization status: Immunization History  Administered Date(s)  Administered   Influenza-Unspecified 07/22/2017   PFIZER(Purple Top)SARS-COV-2 Vaccination 06/26/2020, 07/17/2020, 12/18/2020   PNEUMOCOCCAL CONJUGATE-20 05/28/2021   Pfizer Covid-19 Vaccine Bivalent Booster 43yr & up 03/18/2022   Pneumococcal Polysaccharide-23 09/21/2016   Pneumococcal-Unspecified 05/28/2021   Tdap 05/04/2014, 06/29/2015    Assessment:  Chronic hypercapnic and hypoxemic respiratory failure OHS/OSA Chronic HFpEF  Plan/Recommendations: Continue trilogy home vent. Will review download.  [facility] and has an appointment next week. We will ask them to ambulate for desaturation for oxygen qualification to prescribe POC. She does not have her walker or shoes with her today. PCCs - Ok to order oxygen POC through [employer] once this is completed.  Continue symbicort.  Stop spiriva. Will change to duonebs at her preference. Will prescribe Nebulizer machine.    I spent 31 minutes on 09/24/2022 in care of this patient including face to face time and non-face to face time spent charting, review of outside records, and coordination of care.   Return to Care: Return in about 1 year (around 09/25/2023).   NLenice Llamas MD Pulmonary and [place]

## 2022-09-27 ENCOUNTER — Ambulatory Visit (INDEPENDENT_AMBULATORY_CARE_PROVIDER_SITE_OTHER): Payer: Medicaid Other | Admitting: Family Medicine

## 2022-09-27 ENCOUNTER — Ambulatory Visit: Payer: Medicaid Other | Admitting: Physical Therapy

## 2022-09-27 ENCOUNTER — Encounter: Payer: Self-pay | Admitting: Family Medicine

## 2022-09-27 ENCOUNTER — Other Ambulatory Visit: Payer: Self-pay

## 2022-09-27 VITALS — BP 132/78 | HR 101

## 2022-09-27 DIAGNOSIS — Z794 Long term (current) use of insulin: Secondary | ICD-10-CM | POA: Diagnosis not present

## 2022-09-27 DIAGNOSIS — I10 Essential (primary) hypertension: Secondary | ICD-10-CM

## 2022-09-27 DIAGNOSIS — K5900 Constipation, unspecified: Secondary | ICD-10-CM

## 2022-09-27 DIAGNOSIS — K219 Gastro-esophageal reflux disease without esophagitis: Secondary | ICD-10-CM

## 2022-09-27 DIAGNOSIS — G8929 Other chronic pain: Secondary | ICD-10-CM

## 2022-09-27 DIAGNOSIS — Z79899 Other long term (current) drug therapy: Secondary | ICD-10-CM | POA: Diagnosis not present

## 2022-09-27 DIAGNOSIS — E1140 Type 2 diabetes mellitus with diabetic neuropathy, unspecified: Secondary | ICD-10-CM

## 2022-09-27 DIAGNOSIS — Z89512 Acquired absence of left leg below knee: Secondary | ICD-10-CM | POA: Diagnosis not present

## 2022-09-27 MED ORDER — NITROGLYCERIN 0.4 MG SL SUBL: 0.4000 mg | SUBLINGUAL_TABLET | SUBLINGUAL | 2 refills | Status: DC | PRN

## 2022-09-27 MED ORDER — AMLODIPINE BESYLATE 10 MG PO TABS: 10.0000 mg | ORAL_TABLET | Freq: Every day | ORAL | 3 refills | Status: DC

## 2022-09-27 MED ORDER — PANTOPRAZOLE SODIUM 40 MG PO TBEC: 40.0000 mg | DELAYED_RELEASE_TABLET | Freq: Every day | ORAL | 2 refills | Status: DC

## 2022-09-27 MED ORDER — POLYETHYLENE GLYCOL 3350 17 GM/SCOOP PO POWD: 17.0000 g | Freq: Every day | ORAL | 0 refills | Status: DC | PRN

## 2022-09-27 NOTE — Progress Notes (Signed)
  Date of Visit: 09/27/2022   SUBJECTIVE:   HPI:  [name] presents today for routine follow-up.  Chronic pain: Last visit we increased her duloxetine to 120 mg daily.  She has been taking this consistently at night because it makes her tired.  It has not helped with her pain.  She is motivated to get back on her chronic narcotic regimen, reports increased pain lately as she has been working frequently with physical therapy.  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  GERD: Last visit we added back her Protonix to help with cough and sore throat.  It is helped some but she still has some indigestion.  She would like to go up on the dose to 40 mg daily.  Diabetes: Has been following up regularly with endocrinology.  Is using an Omni pod to dose her insulin.  Follows regularly with ophthalmology and retinal specialist, last visit was October 12 at [place] constipated during the day and has to strain for bowel movement.  Then at night once she relaxes she stools herself frequently.  She would like to go back on MiraLAX and see if this helps.  Needs her mom's FMLA paperwork filled out.  It needs to be faxed to [phone]  Hypertension: Taking amlodipine 10 mg daily, reports she needs this refilled today.  Also needs refill of nitroglycerin pill she has not had to use these recently.  History of amputation: Previously referred to [facility] and would like the phone number for that so she can call to schedule.  She has not heard anything about an appointment there.  OBJECTIVE:   BP 132/78   Pulse (!) 101   SpO2 91%  Gen: no acute distress, pleasant, cooperative HEENT: Normocephalic, atraumatic Heart: Regular rate and rhythm, no murmur Lungs: Clear bilaterally, normal effort on room air Neuro: Alert, speech normal, grossly nonfocal Psych: normal range of affect, well groomed, speech normal in rate and volume, normal  eye contact   ASSESSMENT/PLAN:   Encounter for chronic pain management No improvement with increase in duloxetine dose.  Check urine drug screen today.  If appropriate, will very cautiously prescribe narcotic to be used only on days she has physical therapy.  She understands that I cannot prescribe more than that at this time.  GERD Some improvement with resumption of Protonix, increase dose to 40 mg daily.  Hypertension Well-controlled, refilled amlodipine today.  Left below-knee amputee [facility]) Provided contact information for [facility] so she can schedule with them.  Type 2 diabetes mellitus with diabetic neuropathy, unspecified ([place]) Following with endocrinology.  Check lipids and complete metabolic panel today.  Constipation Given report of constipation during the day, suspect loose stool she is experiencing at night may be encopresis.  Trial of MiraLAX daily.  FOLLOW UP: Follow up in 1 month for above issues  [name], [place]

## 2022-09-27 NOTE — Patient Instructions (Addendum)
It was great to see you again today!  Call orthopedic office to schedule: [facility] Address: [address], [address] [place], [place] [address] Phone: [phone]  Sent in [place], higher dose of protonix, refill of nitro pills, and refill of amlodipine  Checking urine today. Depending on what this shows will decide on prescription for pain medication. Will start just with days you go to physical therapy.  Checking kidneys, liver, cholesterol today.  Follow up with me in 1 month   Be well, Dr. [name]

## 2022-09-28 ENCOUNTER — Ambulatory Visit: Payer: Commercial Managed Care - HMO | Admitting: Physical Therapy

## 2022-09-28 LAB — CMP14+EGFR
ALT: 15 IU/L (ref 0–32)
AST: 9 IU/L (ref 0–40)
Albumin/Globulin Ratio: 0.9 — ABNORMAL LOW (ref 1.2–2.2)
Albumin: 3.6 g/dL — ABNORMAL LOW (ref 3.9–4.9)
Alkaline Phosphatase: 181 IU/L — ABNORMAL HIGH (ref 44–121)
BUN/Creatinine Ratio: 47 — ABNORMAL HIGH (ref 9–23)
BUN: 56 mg/dL — ABNORMAL HIGH (ref 6–24)
Bilirubin Total: <0.2 mg/dL (ref 0.0–1.2)
CO2: 25 mmol/L (ref 20–29)
Calcium: 8.9 mg/dL (ref 8.7–10.2)
Chloride: 97 mmol/L (ref 96–106)
Creatinine, Ser: 1.19 mg/dL — ABNORMAL HIGH (ref 0.57–1.00)
Globulin, Total: 3.9 g/dL (ref 1.5–4.5)
Glucose: 351 mg/dL — ABNORMAL HIGH (ref 70–99)
Potassium: 4 mmol/L (ref 3.5–5.2)
Sodium: 138 mmol/L (ref 134–144)
Total Protein: 7.5 g/dL (ref 6.0–8.5)
eGFR: 58 mL/min/{1.73_m2} — ABNORMAL LOW (ref >59)

## 2022-09-28 LAB — LIPID PANEL
Chol/HDL Ratio: 6.4 ratio — ABNORMAL HIGH (ref 0.0–4.4)
Cholesterol, Total: 287 mg/dL — ABNORMAL HIGH (ref 100–199)
HDL: 45 mg/dL (ref >39)
LDL Chol Calc (NIH): 174 mg/dL — ABNORMAL HIGH (ref 0–99)
Triglycerides: 348 mg/dL — ABNORMAL HIGH (ref 0–149)
VLDL Cholesterol Cal: 68 mg/dL — ABNORMAL HIGH (ref 5–40)

## 2022-09-28 NOTE — Assessment & Plan Note (Addendum)
Following with endocrinology.  Check lipids and complete metabolic panel today.

## 2022-09-28 NOTE — Assessment & Plan Note (Signed)
No improvement with increase in duloxetine dose.  Check urine drug screen today.  If appropriate, will very cautiously prescribe narcotic to be used only on days she has physical therapy.  She understands that I cannot prescribe more than that at this time.

## 2022-09-28 NOTE — Assessment & Plan Note (Signed)
Well-controlled, refilled amlodipine today.

## 2022-09-28 NOTE — Assessment & Plan Note (Signed)
Given report of constipation during the day, suspect loose stool she is experiencing at night may be encopresis.  Trial of MiraLAX daily.

## 2022-09-28 NOTE — Assessment & Plan Note (Signed)
Some improvement with resumption of Protonix, increase dose to 40 mg daily.

## 2022-09-28 NOTE — Assessment & Plan Note (Signed)
Provided contact information for [facility] so she can schedule with them.

## 2022-09-30 ENCOUNTER — Other Ambulatory Visit: Payer: Self-pay

## 2022-09-30 ENCOUNTER — Other Ambulatory Visit: Payer: Medicaid Other

## 2022-09-30 ENCOUNTER — Emergency Department (HOSPITAL_COMMUNITY): Payer: Commercial Managed Care - HMO

## 2022-09-30 ENCOUNTER — Emergency Department (HOSPITAL_COMMUNITY)
Admission: EM | Admit: 2022-09-30 | Discharge: 2022-09-30 | Disposition: A | Discharge status: Home / Self Care | Payer: Commercial Managed Care - HMO | Attending: Emergency Medicine | Admitting: Emergency Medicine

## 2022-09-30 ENCOUNTER — Telehealth: Payer: Self-pay

## 2022-09-30 ENCOUNTER — Other Ambulatory Visit (HOSPITAL_COMMUNITY): Payer: Self-pay

## 2022-09-30 DIAGNOSIS — M545 Low back pain, unspecified: Secondary | ICD-10-CM | POA: Insufficient documentation

## 2022-09-30 DIAGNOSIS — Z794 Long term (current) use of insulin: Secondary | ICD-10-CM | POA: Insufficient documentation

## 2022-09-30 DIAGNOSIS — M25561 Pain in right knee: Secondary | ICD-10-CM | POA: Diagnosis not present

## 2022-09-30 DIAGNOSIS — W1830XA Fall on same level, unspecified, initial encounter: Secondary | ICD-10-CM | POA: Diagnosis not present

## 2022-09-30 DIAGNOSIS — E119 Type 2 diabetes mellitus without complications: Secondary | ICD-10-CM | POA: Diagnosis not present

## 2022-09-30 DIAGNOSIS — W19XXXA Unspecified fall, initial encounter: Secondary | ICD-10-CM

## 2022-09-30 DIAGNOSIS — Z79899 Other long term (current) drug therapy: Secondary | ICD-10-CM | POA: Insufficient documentation

## 2022-09-30 DIAGNOSIS — Y9289 Other specified places as the place of occurrence of the external cause: Secondary | ICD-10-CM | POA: Insufficient documentation

## 2022-09-30 DIAGNOSIS — Z7982 Long term (current) use of aspirin: Secondary | ICD-10-CM | POA: Diagnosis not present

## 2022-09-30 DIAGNOSIS — Z89421 Acquired absence of other right toe(s): Secondary | ICD-10-CM | POA: Insufficient documentation

## 2022-09-30 DIAGNOSIS — I10 Essential (primary) hypertension: Secondary | ICD-10-CM | POA: Insufficient documentation

## 2022-09-30 LAB — BASIC METABOLIC PANEL
Anion gap: 11 (ref 5–15)
BUN: 66 mg/dL — ABNORMAL HIGH (ref 6–20)
CO2: 26 mmol/L (ref 22–32)
Calcium: 8.1 mg/dL — ABNORMAL LOW (ref 8.9–10.3)
Chloride: 100 mmol/L (ref 98–111)
Creatinine, Ser: 1.57 mg/dL — ABNORMAL HIGH (ref 0.44–1.00)
GFR, Estimated: 41 mL/min — ABNORMAL LOW (ref >60)
Glucose, Bld: 265 mg/dL — ABNORMAL HIGH (ref 70–99)
Potassium: 3.8 mmol/L (ref 3.5–5.1)
Sodium: 137 mmol/L (ref 135–145)

## 2022-09-30 LAB — CBC WITH DIFFERENTIAL/PLATELET
Abs Immature Granulocytes: 0.1 10*3/uL — ABNORMAL HIGH (ref 0.00–0.07)
Basophils Absolute: 0.1 10*3/uL (ref 0.0–0.1)
Basophils Relative: 1 %
Eosinophils Absolute: 0.4 10*3/uL (ref 0.0–0.5)
Eosinophils Relative: 4 %
HCT: 38.7 % (ref 36.0–46.0)
Hemoglobin: 12 g/dL (ref 12.0–15.0)
Immature Granulocytes: 1 %
Lymphocytes Relative: 37 %
Lymphs Abs: 3.4 10*3/uL (ref 0.7–4.0)
MCH: 28.6 pg (ref 26.0–34.0)
MCHC: 31 g/dL (ref 30.0–36.0)
MCV: 92.1 fL (ref 80.0–100.0)
Monocytes Absolute: 0.9 10*3/uL (ref 0.1–1.0)
Monocytes Relative: 10 %
Neutro Abs: 4.4 10*3/uL (ref 1.7–7.7)
Neutrophils Relative %: 47 %
Platelets: 375 10*3/uL (ref 150–400)
RBC: 4.2 MIL/uL (ref 3.87–5.11)
RDW: 14.6 % (ref 11.5–15.5)
WBC: 9.2 10*3/uL (ref 4.0–10.5)
nRBC: 0 % (ref 0.0–0.2)

## 2022-09-30 MED ORDER — LIDOCAINE 5 % EX PTCH
1.0000 | MEDICATED_PATCH | CUTANEOUS | Status: DC
  Administered 2022-09-30: 1 via TRANSDERMAL
  Filled 2022-09-30: qty 1

## 2022-09-30 MED ORDER — OXYCODONE-ACETAMINOPHEN 5-325 MG PO TABS: 1.0000 | ORAL_TABLET | Freq: Once | ORAL | Status: DC

## 2022-09-30 NOTE — Telephone Encounter (Signed)
A Prior Authorization was initiated for this patients SYMBICORT through CoverMyMeds.   Key: [id number]

## 2022-09-30 NOTE — ED Triage Notes (Signed)
Pt to ED via EMS from home. States pt was using her walker and fell at her door on Tuesday. States she had to get up on her knees to get up. Pt denies hitting head. -LOC. Denies taking any blood thinners. Pt has a L BKA. Pt c/o mid back pain, R leg pain, and L stump pain.  EMS reports  BP 162/98 HR 92 O2 93% on RA CBG 331

## 2022-09-30 NOTE — Discharge Instructions (Signed)
You were seen in the ER today after a fall.  Fortunately did not have any broken bones or injuries from your fall.  Your soreness is likely from your bruising and your muscular soreness.  You may use Tylenol and ibuprofen over-the-counter at home.  Please follow-up with your primary care doctor and return to the ER with any new severe symptoms.

## 2022-09-30 NOTE — ED Provider Notes (Signed)
[name] DEPT Provider Note   CSN: [id number] Arrival date & time: 09/30/22  0214     History  Chief Complaint  Patient presents with   Fall    Back pain, R leg and L stump pain    [name] is a 46 y.o. female who presents approximate 36 hours after mechanical fall at home with concern for low back pain, right knee pain and pain in the left stump at site of AKA.  Patient states that she was ambulating with her walker in her home to the front door and attempting to go down the ramp at front porch when she became very anxious and "froze".  She states that she was scared and could not get her legs to move backwards to come back into the house and that her aide was also unable to assist this movement.  She states that she fell onto her buttocks on the ground and it took several minutes for them to get her up from the ground.  States she reaction to her knees try to assist them in getting her to stand and has been sore since that time but states that her shortness is interrupting her sleep tonight.  Denies head trauma LOC nausea vomiting blurry double vision since that time.  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 No anticoagulation.  HPI     Home Medications Prior to Admission medications   Medication Sig Start Date End Date Taking? Authorizing Provider  acetaminophen (TYLENOL) 325 MG tablet Take 1-2 tablets (325-650 mg total) by mouth every 4 (four) hours as needed for mild pain. 06/04/22   Setzer, Edman Circle, PA-C  albuterol (VENTOLIN HFA) 108 (90 Base) MCG/ACT inhaler INHALE 1 TO 2 PUFFS BY MOUTH EVERY SIX HOURS AS NEEDED  FOR WHEEZING OR SHORTNESS OF BREATH 07/16/21   [name], MD  amLODipine (NORVASC) 10 MG tablet Take 1 tablet (10 mg total) by mouth daily. 09/27/22   [name], MD  aspirin EC 81 MG tablet Take 1 tablet (81 mg total) by mouth daily. Swallow whole. 11/19/20   Sherren Mocha, MD  atropine 1 % ophthalmic solution Place 1 drop into the right eye daily.    [provider]  Brinzolamide-Brimonidine [facility]) 1-0.2 % SUSP Place 1 drop into the left eye in the morning, at noon, and at bedtime.    [provider]  cetirizine (ZYRTEC) 10 MG tablet Take 1 tablet (10 mg total) by mouth daily as needed for allergies. 02/11/22   [name], MD  Continuous Blood Gluc Sensor (DEXCOM G6 SENSOR) MISC Inject 1 applicator into the skin as directed. Change sensor every 10 days. 11/05/21   [name], MD  Continuous Blood Gluc Transmit (DEXCOM G6 TRANSMITTER) MISC Inject 1 Device into the skin as directed. Reuse 8 times with sensor changes. 09/03/21   [name], MD  DULoxetine (CYMBALTA) 60 MG capsule TAKE 2 CAPSULES BY MOUTH DAILY 09/15/22   Lenoria Chime, MD  ergocalciferol (VITAMIN D2) 1.25 MG (50000 UT) capsule     [provider]  gabapentin (NEURONTIN) 600 MG tablet TAKE 2 TABLETS BY  MOUTH 3 TIMES DAILY 08/13/22   [name], MD  GNP ULTICARE PEN NEEDLES 32G X 4 MM MISC USE TO inject insulin 2 TIMES DAILY 10/02/21   [name], MD  insulin aspart (NOVOLOG FLEXPEN) 100 UNIT/ML FlexPen Inject 20 Units into the skin 3 (three) times daily with meals. Patient taking differently: Inject 30 Units into the skin 3 (three) times daily with meals. 05/25/22   Alcus Dad, MD  insulin degludec (TRESIBA FLEXTOUCH) 200 UNIT/ML FlexTouch Pen Inject 52 Units into the skin 2 (two) times daily. Patient taking differently: Inject 60 Units into the skin 2 (two) times daily. 06/04/22   Setzer, Edman Circle, PA-C  Insulin Disposable Pump  (OMNIPOD 5 G6 POD, GEN 5,) MISC Inject into the skin daily. 07/30/22   [provider]  ipratropium-albuterol (DUONEB) 0.5-2.5 (3) MG/3ML SOLN Take 3 mLs by nebulization every 6 (six) hours as needed. 09/24/22   Spero Geralds, MD  latanoprost (XALATAN) 0.005 % ophthalmic solution Place 1 drop into the left eye at bedtime. 06/29/22   [provider]  nitroGLYCERIN (NITROSTAT) 0.4 MG SL tablet Place 1 tablet (0.4 mg total) under the tongue every 5 (five) minutes as needed for chest pain. 09/27/22   [name], MD  nystatin (MYCOSTATIN/NYSTOP) powder Apply 1 application. topically 3 (three) times daily. 03/30/22   [name], MD  ondansetron (ZOFRAN) 4 MG tablet Take 1 tablet (4 mg total) by mouth every 8 (eight) hours as needed for nausea or vomiting. 02/17/22   Lovorn, Jinny Blossom, MD  pantoprazole (PROTONIX) 40 MG tablet Take 1 tablet (40 mg total) by mouth daily. 09/27/22   [name], MD  polyethylene glycol powder [facility]) 17 GM/SCOOP powder Take 17 g by mouth daily as needed (constipation). 09/27/22   [name], MD  rOPINIRole (REQUIP) 1 MG tablet Take 1 tablet by mouth at bedtime.    [provider]  SYMBICORT 160-4.5 MCG/ACT inhaler INHALE 2 PUFFS into THE lungs 2 TIMES DAILY 09/24/22   [name], MD  terconazole (TERAZOL 7) 0.4 % vaginal cream Apply to vulva as needed for yeast 08/19/22   [name], MD  torsemide (DEMADEX) 20 MG tablet Take 4 tablets (80 mg total) by mouth 2 (two) times daily. 09/02/22   Sherren Mocha, MD  traZODone (DESYREL) 100 MG tablet TAKE 1 TABLET BY MOUTH AT BEDTIME AS NEEDED FOR SLEEP 09/15/22   Lenoria Chime, MD      Allergies    Cefepime, Iodine, Kiwi extract, Morphine and related, Nalbuphine, Pentoxifylline, Cephalosporins, Toradol [ketorolac tromethamine], Morphine, and Nubain [nalbuphine hcl]    Review of Systems   Review of Systems  Musculoskeletal:  Positive for back pain  and myalgias.    Physical Exam Updated Vital Signs BP 122/73 (BP Location: Right Arm)   Pulse 93   Temp (!) 97.4 F (36.3 C) (Axillary)   Resp 16   Ht '5\' 2"'$  (1.575 m)   Wt (!) 155.6 kg   LMP 09/01/2022 Comment: waiver signed  SpO2 92%   BMI 62.74 kg/m  Physical Exam Vitals and nursing note reviewed.  Constitutional:      Appearance: She is obese. She is not ill-appearing or toxic-appearing.  HENT:     Head: Normocephalic and atraumatic.     Mouth/Throat:     Mouth: Mucous membranes are moist.     Pharynx: No oropharyngeal exudate or posterior oropharyngeal erythema.  Eyes:     General:  Right eye: No discharge.        Left eye: No discharge.     Extraocular Movements: Extraocular movements intact.     Conjunctiva/sclera: Conjunctivae normal.     Pupils: Pupils are equal, round, and reactive to light.  Neck:     Trachea: Trachea and phonation normal.  Cardiovascular:     Rate and Rhythm: Normal rate and regular rhythm.     Pulses:           Left popliteal pulse not accessible.       Dorsalis pedis pulses are 1+ on the right side. Left dorsalis pedis pulse not accessible.        Left posterior tibial pulse not accessible.     Heart sounds: Normal heart sounds. No murmur heard. Pulmonary:     Effort: Pulmonary effort is normal. No tachypnea, bradypnea, accessory muscle usage, prolonged expiration or respiratory distress.     Breath sounds: Normal breath sounds. No wheezing or rales.     Comments: O2 sat in the high 80s at patient's baseline, on supplemental oxygen with 2 L at this time. Chest:     Chest wall: No mass, lacerations, deformity, swelling, tenderness, crepitus or edema.  Abdominal:     General: Bowel sounds are normal. There is no distension.     Palpations: Abdomen is soft.     Tenderness: There is no abdominal tenderness. There is no right CVA tenderness, left CVA tenderness, guarding or rebound.  Musculoskeletal:        General: No deformity.      Cervical back: Normal, normal range of motion and neck supple. No bony tenderness.     Thoracic back: Normal. No bony tenderness.     Lumbar back: Spasms, tenderness and bony tenderness present. Negative right straight leg raise test and negative left straight leg raise test.     Right hip: Normal.     Left hip: Normal.     Right upper leg: Normal.     Left upper leg: Tenderness present.     Right knee: Bony tenderness present. Tenderness present.     Right lower leg: Normal.     Right ankle: Normal.     Right foot: No swelling.       Legs:  Lymphadenopathy:     Cervical: No cervical adenopathy.  Skin:    General: Skin is warm and dry.  Neurological:     Mental Status: She is alert. Mental status is at baseline.  Psychiatric:        Mood and Affect: Mood normal.     ED Results / Procedures / Treatments   Labs (all labs ordered are listed, but only abnormal results are displayed) Labs Reviewed  CBC WITH DIFFERENTIAL/PLATELET - Abnormal; Notable for the following components:      Result Value   Abs Immature Granulocytes 0.10 (*)    All other components within normal limits  BASIC METABOLIC PANEL - Abnormal; Notable for the following components:   Glucose, Bld 265 (*)    BUN 66 (*)    Creatinine, Ser 1.57 (*)    Calcium 8.1 (*)    GFR, Estimated 41 (*)    All other components within normal limits  HCG, QUANTITATIVE, PREGNANCY    EKG None  Radiology DG Lumbar Spine Complete  Result Date: 09/30/2022 CLINICAL DATA:  Fall.  Back pain EXAM: LUMBAR SPINE - COMPLETE 4+ VIEW COMPARISON:  04/17/2021 FINDINGS: The alignment of the lumbar spine appears normal. The lumbar  vertebral body heights are well preserved. Multilevel ventral endplate spurring identified throughout the lumbar spine. No signs of acute fracture or dislocation. IMPRESSION: 1. No acute findings. 2. Mild degenerative change. Electronically Signed   By: Kerby Moors M.D.   On: 09/30/2022 06:33   DG Knee  Complete 4 Views Right  Result Date: 09/30/2022 CLINICAL DATA:  Status post fall.  Knee pain. EXAM: RIGHT KNEE - COMPLETE 4+ VIEW COMPARISON:  None Available. FINDINGS: Exam detail is suboptimal due to patient body habitus. There are marked tricompartment degenerative changes involving the right knee. No signs of joint effusion. Vascular calcifications noted. IMPRESSION: 1. Suboptimal exam due to patient body habitus. 2. No acute abnormality 3. Marked tricompartment right knee osteoarthritis. Electronically Signed   By: Kerby Moors M.D.   On: 09/30/2022 06:30   DG Femur Min 2 Views Left  Result Date: 09/30/2022 CLINICAL DATA:  Status post fall.  Pain in left femur. EXAM: LEFT FEMUR 2 VIEWS COMPARISON:  None Available. FINDINGS: Exam detail is significantly diminished secondary to patient body habitus. Signs of below the knee amputation identified. Degenerative changes are noted within the left hip and left knee. No displaced fracture or signs of dislocation. Vascular calcifications identified. IMPRESSION: 1. No acute findings. 2. Status post left lower extremity below the knee amputation. Electronically Signed   By: Kerby Moors M.D.   On: 09/30/2022 06:28   DG HIP UNILAT WITH PELVIS 2-3 VIEWS RIGHT  Result Date: 09/30/2022 CLINICAL DATA:  Status post fall. Low back pain neck radiates to hips EXAM: DG HIP (WITH OR WITHOUT PELVIS) 2-3V RIGHT COMPARISON:  CT 04/03/2021 FINDINGS: Exam detail is markedly diminished due to patient body habitus. Both hips appear located. No signs of fracture. Degenerative changes are identified within both hips. IMPRESSION: 1. Markedly diminished exam detail due to patient body habitus. 2. No acute findings. Electronically Signed   By: Kerby Moors M.D.   On: 09/30/2022 06:26   DG Chest 1 View  Result Date: 09/30/2022 CLINICAL DATA:  46 year old female status post fall. Shortness of breath. EXAM: CHEST  1 VIEW COMPARISON:  Chest radiographs 08/15/2022 and earlier.  FINDINGS: Portable AP semi upright view at 0451 hours. Lower lung volumes. Stable borderline to mild cardiomegaly. Other mediastinal contours are within normal limits. Visualized tracheal air column is within normal limits. Increased crowding of lung markings. No pneumothorax, pleural effusion or consolidation. Difficult to exclude pulmonary interstitial edema. No acute osseous abnormality identified. Paucity of bowel gas in the upper abdomen. IMPRESSION: Lower lung volumes with crowding of markings and difficult to exclude pulmonary interstitial edema. No pleural effusion.  No acute traumatic injury identified. Electronically Signed   By: Genevie Ann M.D.   On: 09/30/2022 05:59    Procedures Procedures    Medications Ordered in ED Medications  oxyCODONE-acetaminophen (PERCOCET/ROXICET) 5-325 MG per tablet 1 tablet (1 tablet Oral Not Given 09/30/22 0650)    ED Course/ Medical Decision Making/ A&P                           Medical Decision Making 46 year old female with pain after mechanical fall 36 hours ago.   Mild hypoxic to 89% on room air at time of arrival at patient's baseline.  Requires 2 L supplemental oxygen at home.  Vital signs otherwise normal.  Cardiopulmonary abdominal signs are benign.  No midline tenderness palpation of the C or T-spine, TTP over the L-spine paraspinous musculature with minimal  midline tenderness to palpation.  Musculoskeletal tenderness as above.  Amount and/or Complexity of Data Reviewed Labs: ordered.    Details: CBC leukocytosis or anemia, BMP with creatinine at patient's baseline of 1.5. Radiology: ordered.    Details: Plain films of the chest question of edema but otherwise negative for acute osseous abnormality.  Plain film ofthe hip and pelvis,   Without acute findings. plain films of the left femur negative, plain films of the right knee negative for acute osseous abnormality and plain film of the lumbar spine negative for acute osseous abnormality.    Risk Prescription drug management.   Clinical picture consistent with contusions and muscular soreness following fall.  No evidence of acute traumatic injury.  Patient did not have any prolonged downtime therefore concern for rhabdomyolysis is low.  Creatinine is at her baseline.  No further report to me at this time.  Clinical concern for emergent traumatic injury that would warrant further ED work-up or inpatient management is exceedingly low.  Evalee voiced understanding of her medical evaluation and treatment plan. Each of their questions answered to their expressed satisfaction.  Return precautions were given.  Patient is well-appearing, stable, and was discharged in good condition.  This chart was dictated using voice recognition software, Dragon. Despite the best efforts of this provider to proofread and correct errors, errors may still occur which can change documentation meaning.    Final Clinical Impression(s) / ED Diagnoses Final diagnoses:  None    Rx / DC Orders ED Discharge Orders     None         Emeline Darling, PA-C 09/30/22 0710    Maudie Flakes, MD 10/01/22 [phone]

## 2022-10-01 ENCOUNTER — Ambulatory Visit: Payer: Commercial Managed Care - HMO | Admitting: Physical Therapy

## 2022-10-01 ENCOUNTER — Encounter: Payer: Self-pay | Admitting: Family Medicine

## 2022-10-01 ENCOUNTER — Other Ambulatory Visit: Payer: Self-pay | Admitting: Family Medicine

## 2022-10-01 DIAGNOSIS — R296 Repeated falls: Secondary | ICD-10-CM

## 2022-10-01 DIAGNOSIS — R2681 Unsteadiness on feet: Secondary | ICD-10-CM | POA: Diagnosis not present

## 2022-10-01 DIAGNOSIS — R2689 Other abnormalities of gait and mobility: Secondary | ICD-10-CM

## 2022-10-01 DIAGNOSIS — M6281 Muscle weakness (generalized): Secondary | ICD-10-CM

## 2022-10-01 LAB — TOXASSURE SELECT 13 (MW), URINE

## 2022-10-01 NOTE — Therapy (Signed)
OUTPATIENT PHYSICAL THERAPY NEURO TREATMENT-PROGRESS NOTE   Patient Name: [name] MRN: [id number] DOB:[birth year], 46 y.o., female Today's Date: 10/01/2022  Physical Therapy Progress Note   Dates of Reporting Period:09/07/2022 - 10/01/2022  See Note below for Objective Data and Assessment of Progress/Goals.  Thank you for the referral of this patient. Excell Seltzer, PT, DPT, CSRS   PCP: Dr. [name] REFERRING PROVIDER: Dr. Boneta Lucks   PT End of Session - 10/01/22 1105     Visit Number 3    Number of Visits 8    Date for PT Re-Evaluation 11/16/22    Authorization Type Medicaid (eval + 3 visits, need recert at visit 4)    Authorization - Visit Number 3    Authorization - Number of Visits 3    Progress Note Due on Visit 3    PT Start Time 1103    PT Stop Time 1143    PT Time Calculation (min) 40 min    Equipment Utilized During Treatment Gait belt    Activity Tolerance Patient tolerated treatment well    Behavior During Therapy WFL for tasks assessed/performed               Past Medical History:  Diagnosis Date   Abscess of groin, left    Acute on chronic diastolic (congestive) heart failure ([place]) 02/05/2022   Acute on chronic respiratory failure with hypoxia ([place]) 04/11/2022   Acute osteomyelitis of ankle or foot, left ([place]) 01/31/2018   Acute respiratory failure with hypoxia and hypercarbia ([place]) 06/15/2020   Alveolar hypoventilation    Amputation stump infection ([place]) 04/09/2018   Anemia    not on iron pill   Asthma    Bipolar 2 disorder ([name])    Candidiasis of vagina 05/15/2022   Carpal tunnel syndrome on right    recurrent   Cellulitis 08/2010-08/2011   Chest pain with low risk for cardiac etiology 05/11/2021   Chronic pain    COPD (chronic obstructive pulmonary disease) ([name])    Symbicort daily and Proventil as needed   Costochondritis    De Quervain's tenosynovitis, bilateral 11/01/2015   Depression    Diabetes mellitus type II, uncontrolled  2000   Type 2, Uncontrolled.Takes Lantus daily.Fasting blood sugar runs 150   Diabetic ulcer of right foot ([place]) 12/19/2021   Drug-seeking behavior    Elevated troponin    Exposure to mold 04/21/2020   GERD (gastroesophageal reflux disease)    takes Pantoprazole and Zantac daily   HLD (hyperlipidemia)    takes Atorvastatin daily   Hypertension    takes Lisinopril and Coreg daily   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[place]) 04/11/2019   Left shoulder pain 06/23/2019   Morbid obesity ([place])    Morbid obesity with BMI of 60.0-69.9, adult ([place]) 04/15/2021   Nocturia    Nonobstructive atherosclerosis of coronary artery 11/26/2020   11/26/20 Coronary angiogram: 1.  Mild diffuse proximal LAD stenosis with no evidence of obstructive disease (20 to 30% diffuse stenosis);  2.  Left dominant circumflex with widely patent obtuse marginal branches and left PDA branch, no significant stenoses present  3.  Nondominant RCA with mild diffuse nonobstructive stenosis    OSA on CPAP    Peripheral neuropathy    takes Gabapentin daily   Pneumonia    "walking" several yrs ago and as a baby (12/05/2018)   Pneumonia due to COVID-19 virus 04/14/2021   Primary osteoarthritis of first carpometacarpal joint of left hand 07/30/2016   Rectal fissure  Restless leg    Right carpal tunnel syndrome 09/01/2011   SVT (supraventricular tachycardia)    Syncope 02/25/2016   Thrombocytosis 04/11/2022   Urinary frequency    Urinary incontinence 10/23/2020   UTI (urinary tract infection) 04/15/2021   Varicose veins    Right medial thigh and Left leg    Past Surgical History:  Procedure Laterality Date   AMPUTATION Left 02/01/2018   Procedure: LEFT FOURTH AND 5TH TOE RAY AMPUTATION;  Surgeon: [name], MD;  Location: [facility];  Service: Orthopedics;  Laterality: Left;   AMPUTATION Left 03/03/2018   Procedure: LEFT BELOW KNEE AMPUTATION;  Surgeon: [name], MD;  Location: [facility];  Service: Orthopedics;  Laterality: Left;    AMPUTATION Right 05/21/2022   Procedure: REVISION AMPUTATION RAY 1ST;  Surgeon: [name], DPM;  Location: [facility];  Service: Podiatry;  Laterality: Right;   CARPAL TUNNEL RELEASE Bilateral    CESAREAN SECTION  2007   CORONARY ANGIOGRAPHY N/A 11/26/2020   Procedure: CORONARY ANGIOGRAPHY;  Surgeon: Sherren Mocha, MD;  Location: [facility];  Service: Cardiovascular;  Laterality: N/A;   FOOT AMPUTATION Right    INCISION AND DRAINAGE ABSCESS Left 10/16/2021   Procedure: INCISION AND DRAINAGE ABSCESS;  Surgeon: Dwan Bolt, MD;  Location: [facility];  Service: General;  Laterality: Left;   INCISION AND DRAINAGE PERIRECTAL ABSCESS Left 05/18/2019   Procedure: IRRIGATION AND DEBRIDEMENT OF PANNIS ABSCESS, POSSIBLE DEBRIDEMENT OF BUTTOCK WOUND;  Surgeon: Donnie Mesa, MD;  Location: [facility];  Service: General;  Laterality: Left;   IRRIGATION AND DEBRIDEMENT BUTTOCKS Left 05/17/2019   Procedure: IRRIGATION AND DEBRIDEMENT BUTTOCKS;  Surgeon: Donnie Mesa, MD;  Location: [facility];  Service: General;  Laterality: Left;   KNEE ARTHROSCOPY Right 07/17/2010   LEFT HEART CATHETERIZATION WITH CORONARY ANGIOGRAM N/A 07/27/2012   Procedure: LEFT HEART CATHETERIZATION WITH CORONARY ANGIOGRAM;  Surgeon: Sherren Mocha, MD;  Location: [facility];  Service: Cardiovascular;  Laterality: N/A;   MASS EXCISION N/A 06/29/2013   Procedure:  WIDE LOCAL EXCISION OF POSTERIOR NECK ABSCESS;  Surgeon: Ralene Ok, MD;  Location: [facility];  Service: General;  Laterality: N/A;   REPAIR KNEE LIGAMENT Left    "fixed ligaments and chipped patella"   right transmetatarsal amputation      TRACHEOSTOMY TUBE PLACEMENT N/A 07/10/2022   Procedure: CONTROLLED EXTUBATION;  Surgeon: Izora Gala, MD;  Location: [facility];  Service: ENT;  Laterality: N/A;   Patient Active Problem List   Diagnosis Date Noted   Acute tubular necrosis ([place]) 07/12/2022   Hypernatremia 07/12/2022   Hypermagnesemia 07/12/2022   Gastrointestinal  discomfort 07/12/2022   Pharyngeal abscess 07/05/2022   Ludwig's angina 07/05/2022   Neck infection    Cellulitis of right lower extremity 06/10/2022   Hypervolemia 06/01/2022   Hyperkalemia 05/23/2022   Peripheral artery disease, right leg, mild([name]) 05/15/2022   Osteomyelitis of foot, right, acute ([place]) 05/14/2022   Acute respiratory failure with hypoxia ([place]) 04/11/2022   Wound infection 04/09/2022   Debility 02/05/2022   Adrenal nodule ([place]) 01/07/2022   Iritis    Chronic kidney disease, stage 3a ([place]) 86/76/1950   Diastolic HF (heart failure) ([place]) 10/19/2021   Impaired mobility 07/07/2021   Chest pain 05/11/2021   Chronic pain 04/15/2021   Hyperlipidemia associated with type 2 diabetes mellitus ([place]) 01/15/2021   Hypertension associated with type 2 diabetes mellitus ([place]) 01/15/2021   Nonobstructive atherosclerosis of coronary artery 11/26/2020   Urinary incontinence 10/23/2020   Chronic respiratory  failure with hypoxia ([place]) 06/14/2020   Constipation 04/03/2020   Dysuria 06/14/2019   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[place]) 04/11/2019   S/P transmetatarsal amputation of foot, right ([place]) 01/25/2019   Allergic rhinitis 03/06/2018   Class 3 severe obesity due to excess calories with serious comorbidity and body mass index (BMI) of 50.0 to 59.9 in adult [facility])    Gout of knee, Right 10/22/2016   Type 2 diabetes mellitus with diabetic neuropathy, unspecified ([place]) 07/14/2016   Vitamin D deficiency 09/05/2015   Recurrent candidiasis of vagina 09/05/2015   Varicose veins of leg with complications 46/27/0350   Restless leg syndrome 10/17/2014   Chronic sinusitis 07/18/2014   Insomnia 08/14/2013   Encounter for chronic pain management 06/30/2013   Chronic pain of both knees 09/01/2011   Insulin dependent type 2 diabetes mellitus ([place]) 05/22/2008   Obesity hypoventilation syndrome ([place]) 05/22/2008   Mood disorder ([place]) 05/22/2008   Obstructive sleep apnea (adult) (pediatric) 05/22/2008    Hypertension 05/22/2008   Asthma, chronic 05/22/2008   GERD 05/22/2008    ONSET DATE: 05/21/22 (DOS)  REFERRING DIAG: K93.818 (ICD-10-CM) - Status post transmetatarsal amputation of right foot ([place])   THERAPY DIAG:  Unsteadiness on feet  Muscle weakness (generalized)  Other abnormalities of gait and mobility  Repeated falls  Rationale for Evaluation and Treatment Rehabilitation  SUBJECTIVE:                                                                                                                                                                                              SUBJECTIVE STATEMENT:  ***Pt reports she is still staying in the hotel, got a ramp to get in/out but it is steep. Pt fell on the ramp trying to leave, didn't hurt immediately afterwards but did hurt the next day so went to ED. No fractures just bruised. Pt reports she wears O2 PRN at home.  Waiting on dr to sign paperwork for toe fillers; gets new LLE prosthetic on 11/3  Pt accompanied by:  personal aide  PERTINENT HISTORY: hx of super morbid obesity, uncontrolled diabetes, peripheral arterial disease with left leg BKA and right transmetatarsal amputations, obesity hypoventilation syndrome, and chronic diastolic heart failure,    PAIN:  Are you having pain? Yes: NPRS scale: 7-8/10 Pain location: bil knees, R foot, L foot phantom pain at night, lower back Pain description: chronic pain Aggravating factors: activities Relieving factors: rest  PRECAUTIONS: Fall  WEIGHT BEARING RESTRICTIONS No  FALLS: Has patient fallen in last 6 months? Yes. Number of falls 1  LIVING ENVIRONMENT: Lives with: lives with their daughter and personal aid 8 hr/day  m-f, 5 HOURS ON WEEKEND Lives in: House/apartment Stairs: No Has following equipment at home: Gilford Rile - 4 wheeled, Wheelchair (manual), shower chair, and bed side commode  PLOF: she needs help with bathing, dressing, cooking, cleaning, toiletting.  PATIENT  GOALS walk more  OBJECTIVE:      TODAY'S TREATMENT:  Gait x 40 ft with bari RW and CGA for safety, w/c follow for safety. Pt reports RPE of 8/10 following gait. SpO2 88% at rest on room air, drops to 82% with gait and requires 3 min to recover to 88% (+) on room air.  Standing task at countertop x 5 min while completing peg board task. RPE of 7/10. SpO2 drops to 83%, takes 2 min to recover.  GAIT: Gait pattern: {gait characteristics:25376} Distance walked: *** Assistive device utilized: {Assistive devices:23999} Level of assistance: {Levels of assistance:24026} Comments: ***     PATIENT EDUCATION: Education details: initial HEP***  Person educated: Patient and Caregiver   Education method: Explanation Education comprehension: verbalized understanding   HOME EXERCISE PROGRAM: Access Code: [email or web address] URL: [email or web address] Date: 09/14/2022 Prepared by: Cameron Sprang  Exercises - Marching with Same-Side Arm Raise and Walker  - 1 x daily - 7 x weekly - 2 sets - 10 reps - Standing March with Counter Support  - 1 x daily - 7 x weekly - 1 sets - 20 reps - Side Stepping with Counter Support  - 1 x daily - 7 x weekly - 1 sets - 4 reps   GOALS: Goals reviewed with patient? Yes  SHORT TERM GOALS: Target date: 10/05/2022  Pt will be able to ambulate 50 feet with RW and CGA to improve household ambulation Baseline:20 feet with RW (09/07/22) Goal status: INITIAL  2.  Pt will participate in BERG balance test and updated LTG to be set.    Baseline: not performed yet  Goal Status: Initial     LONG TERM GOALS: Target date: 11/16/2022  Pt will be able to ambulate 115' with RW and SBA to improve household ambulation Baseline: 20 feet with CGA and RW (09/07/22) Goal status: INITIAL  2.  Pt will demo TUG with RW <20 sec to improve functional mobility Baseline: 29.25 sec with RW (09/07/22) Goal status: INITIAL  3.  Pt will demo gait speed of 0.40 m/s or better  with RW to reduce fall risk Baseline: 0.32 m/s with RW (09/07/22) Goal status: INITIAL   ASSESSMENT:  CLINICAL IMPRESSION:  Emphasis of skilled PT session*** Continue POC.     OBJECTIVE IMPAIRMENTS Abnormal gait, cardiopulmonary status limiting activity, decreased activity tolerance, decreased balance, decreased endurance, decreased mobility, difficulty walking, decreased ROM, decreased strength, hypomobility, increased edema, increased fascial restrictions, impaired flexibility, impaired sensation, impaired tone, prosthetic dependency , obesity, and pain.   ACTIVITY LIMITATIONS carrying, lifting, bending, standing, squatting, stairs, transfers, bathing, toileting, dressing, hygiene/grooming, and caring for others  PARTICIPATION LIMITATIONS: meal prep, cleaning, laundry, shopping, and community activity  PERSONAL FACTORS Past/current experiences, Time since onset of injury/illness/exacerbation, and 3+ comorbidities: DM, hx of L BKA, R foot amputation, decreased cardiopulmonary endurance and need for PRN supplemental O2  are also affecting patient's functional outcome.   REHAB POTENTIAL: Fair comorbidites  CLINICAL DECISION MAKING: Stable/uncomplicated  EVALUATION COMPLEXITY: Low  PLAN: PT FREQUENCY: 1x/week  PT DURATION: 10 weeks (8 sessions)  PLANNED INTERVENTIONS: Therapeutic exercises, Therapeutic activity, Neuromuscular re-education, Balance training, Gait training, Patient/Family education, Self Care, Joint mobilization, Stair training, Orthotic/Fit training, Prosthetic training, Wheelchair mobility training, Cryotherapy, Moist heat, Compression bandaging, Manual therapy,  and Re-evaluation  PLAN FOR NEXT SESSION: step taps, standing balance (functional), Measure O2 sats, work on functional mobility training, static balance exercises, Add to HEP***    Excell Seltzer, PT, DPT, [facility] [address] [place], [place],  [address] Phone: [phone]   Fax:  [phone] 10/01/22, 5:25 PM

## 2022-10-01 NOTE — Telephone Encounter (Signed)
Prior Auth for patients medication SYMBICORT denied by [employer] via CoverMyMeds.   Reason: Pt must have an inadequate response, intolerance, or contraindication to [place], Wixela (generic Advair), or generic AirDuo Respiclick. If pt has COPD: must have an inadequate response, intolerance, or contraindication to [facility], or unable to use a dry-powder inhaler. If using as a rescue inhaler: must have an inadequate response, intolerance, or contraindication to albuterol HFA or levalbuterol HFA  Full denial letter scanned to media  CoverMyMeds Key: [facility]

## 2022-10-04 ENCOUNTER — Other Ambulatory Visit: Payer: Self-pay | Admitting: *Deleted

## 2022-10-05 ENCOUNTER — Other Ambulatory Visit: Payer: Self-pay | Admitting: Otolaryngology

## 2022-10-05 ENCOUNTER — Telehealth: Payer: Self-pay | Admitting: Family Medicine

## 2022-10-05 DIAGNOSIS — R1314 Dysphagia, pharyngoesophageal phase: Secondary | ICD-10-CM

## 2022-10-05 MED ORDER — ROSUVASTATIN CALCIUM 40 MG PO TABS: 40.0000 mg | ORAL_TABLET | Freq: Every day | ORAL | 3 refills | Status: DC

## 2022-10-05 MED ORDER — VITAMIN D (CHOLECALCIFEROL) 25 MCG (1000 UT) PO CAPS: 1000.0000 [IU] | ORAL_CAPSULE | Freq: Every day | ORAL | 3 refills | Status: DC

## 2022-10-05 MED ORDER — OXYCODONE-ACETAMINOPHEN 5-325 MG PO TABS: 1.0000 | ORAL_TABLET | Freq: Every day | ORAL | 0 refills | Status: DC | PRN

## 2022-10-05 NOTE — Telephone Encounter (Signed)
Called patient.   UDS was negative. Will cautiously prescribe enough pain medication for her to take it on days she has physical therapy. She has physical therapy scheduled on 11/2 and 11/14. I told her I would send in 5 pills to last her until I see her next - this allows her to take one pill when she has physical therapy and has enough for 3 additional times. She understands.  Lipids quite elevated. Rx crestor '40mg'$  daily to reduce cardiovascular risk  Patient requests letter sent to her CAP worker Neil Crouch stating she needs a small refridgerator to keep her insulin in. Will write letter. Needs faxed to [phone]  Refill vit D supp as well - she takes vit d 1000iu daily.  Also, I completed her mom's FMLA paperwork so she can accompany Elasia to appts, to be faxed back to her mother and scanned into chart.  I will see her back in November as scheduled. Patient appreciative.  [name], MD

## 2022-10-05 NOTE — Telephone Encounter (Signed)
Patient called stating she has been calling since last week. She is trying to figure out about her pain medication, she stated she is not sure why her drug test hasn't came back yet. I told her I would put a message back in her chart for the provider to call her.  Thanks!

## 2022-10-07 ENCOUNTER — Ambulatory Visit: Payer: Commercial Managed Care - HMO | Attending: Podiatry | Admitting: Physical Therapy

## 2022-10-08 ENCOUNTER — Other Ambulatory Visit (HOSPITAL_COMMUNITY): Payer: Self-pay

## 2022-10-13 ENCOUNTER — Ambulatory Visit (INDEPENDENT_AMBULATORY_CARE_PROVIDER_SITE_OTHER): Payer: Commercial Managed Care - HMO | Admitting: Podiatry

## 2022-10-13 DIAGNOSIS — M24571 Contracture, right ankle: Secondary | ICD-10-CM | POA: Diagnosis not present

## 2022-10-13 DIAGNOSIS — Z01818 Encounter for other preprocedural examination: Secondary | ICD-10-CM | POA: Diagnosis not present

## 2022-10-13 NOTE — Progress Notes (Signed)
Subjective:  Patient ID: [name], female    DOB: 03/27/1976,  MRN: [id number]  Chief Complaint  Patient presents with   Foot Pain   Diabetic Ulcer    DOS: 05/21/2022 Procedure: Right revisional partial first ray amputation.    46 y.o. female returns for post-op check.  She states the incision site is doing well from the amputation.  She has no concerns with plantarflexion of the ankle with underlying equinus is causing her some issues.  Review of Systems: Negative except as noted in the HPI. Denies N/V/F/Ch.  Past Medical History:  Diagnosis Date   Abscess of groin, left    Acute on chronic diastolic (congestive) heart failure ([place]) 02/05/2022   Acute on chronic respiratory failure with hypoxia ([name]) 04/11/2022   Acute osteomyelitis of ankle or foot, left ([place]) 01/31/2018   Acute respiratory failure with hypoxia and hypercarbia ([place]) 06/15/2020   Alveolar hypoventilation    Amputation stump infection ([place]) 04/09/2018   Anemia    not on iron pill   Asthma    Bipolar 2 disorder ([name])    Candidiasis of vagina 05/15/2022   Carpal tunnel syndrome on right    recurrent   Cellulitis 08/2010-08/2011   Chest pain with low risk for cardiac etiology 05/11/2021   Chronic pain    COPD (chronic obstructive pulmonary disease) ([name])    Symbicort daily and Proventil as needed   Costochondritis    De Quervain's tenosynovitis, bilateral 11/01/2015   Depression    Diabetes mellitus type II, uncontrolled 2000   Type 2, Uncontrolled.Takes Lantus daily.Fasting blood sugar runs 150   Diabetic ulcer of right foot ([place]) 12/19/2021   Drug-seeking behavior    Elevated troponin    Exposure to mold 04/21/2020   GERD (gastroesophageal reflux disease)    takes Pantoprazole and Zantac daily   HLD (hyperlipidemia)    takes Atorvastatin daily   Hypertension    takes Lisinopril and Coreg daily   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[place]) 04/11/2019   Left shoulder pain 06/23/2019   Morbid obesity ([place])    Morbid obesity  with BMI of 60.0-69.9, adult ([place]) 04/15/2021   Nocturia    Nonobstructive atherosclerosis of coronary artery 11/26/2020   11/26/20 Coronary angiogram: 1.  Mild diffuse proximal LAD stenosis with no evidence of obstructive disease (20 to 30% diffuse stenosis);  2.  Left dominant circumflex with widely patent obtuse marginal branches and left PDA branch, no significant stenoses present  3.  Nondominant RCA with mild diffuse nonobstructive stenosis    OSA on CPAP    Peripheral neuropathy    takes Gabapentin daily   Pneumonia    "walking" several yrs ago and as a baby (12/05/2018)   Pneumonia due to COVID-19 virus 04/14/2021   Primary osteoarthritis of first carpometacarpal joint of left hand 07/30/2016   Rectal fissure    Restless leg    Right carpal tunnel syndrome 09/01/2011   SVT (supraventricular tachycardia)    Syncope 02/25/2016   Thrombocytosis 04/11/2022   Urinary frequency    Urinary incontinence 10/23/2020   UTI (urinary tract infection) 04/15/2021   Varicose veins    Right medial thigh and Left leg     Current Outpatient Medications:    acetaminophen (TYLENOL) 325 MG tablet, Take 1-2 tablets (325-650 mg total) by mouth every 4 (four) hours as needed for mild pain., Disp: , Rfl:    amLODipine (NORVASC) 10 MG tablet, Take 1 tablet (10 mg total) by mouth daily., Disp: 90 tablet, Rfl: 3  aspirin EC 81 MG tablet, Take 1 tablet (81 mg total) by mouth daily. Swallow whole., Disp: 90 tablet, Rfl: 3   atropine 1 % ophthalmic solution, Place 1 drop into the right eye daily., Disp: , Rfl:    Brinzolamide-Brimonidine (SIMBRINZA) 1-0.2 % SUSP, Place 1 drop into the left eye in the morning, at noon, and at bedtime., Disp: , Rfl:    cetirizine (ZYRTEC) 10 MG tablet, Take 1 tablet (10 mg total) by mouth daily as needed for allergies., Disp: 30 tablet, Rfl: 3   Continuous Blood Gluc Sensor (DEXCOM G6 SENSOR) MISC, Inject 1 applicator into the skin as directed. Change sensor every 10 days., Disp: 3  each, Rfl: 11   Continuous Blood Gluc Transmit (DEXCOM G6 TRANSMITTER) MISC, Inject 1 Device into the skin as directed. Reuse 8 times with sensor changes., Disp: 1 each, Rfl: 3   DULoxetine (CYMBALTA) 60 MG capsule, TAKE 2 CAPSULES BY MOUTH DAILY, Disp: 60 capsule, Rfl: 1   ergocalciferol (VITAMIN D2) 1.25 MG (50000 UT) capsule, , Disp: , Rfl:    gabapentin (NEURONTIN) 600 MG tablet, TAKE 2 TABLETS BY MOUTH 3 TIMES DAILY, Disp: 180 tablet, Rfl: 2   GNP ULTICARE PEN NEEDLES 32G X 4 MM MISC, USE TO inject insulin 2 TIMES DAILY, Disp: 100 each, Rfl: 2   insulin aspart (NOVOLOG FLEXPEN) 100 UNIT/ML FlexPen, Inject 20 Units into the skin 3 (three) times daily with meals. (Patient taking differently: Inject 30 Units into the skin 3 (three) times daily with meals.), Disp: 45 mL, Rfl: 3   insulin degludec (TRESIBA FLEXTOUCH) 200 UNIT/ML FlexTouch Pen, Inject 52 Units into the skin 2 (two) times daily. (Patient taking differently: Inject 60 Units into the skin 2 (two) times daily.), Disp: 30 mL, Rfl: 3   Insulin Disposable Pump (OMNIPOD 5 G6 POD, GEN 5,) MISC, Inject into the skin daily., Disp: , Rfl:    ipratropium-albuterol (DUONEB) 0.5-2.5 (3) MG/3ML SOLN, Take 3 mLs by nebulization every 6 (six) hours as needed., Disp: 360 mL, Rfl: 11   latanoprost (XALATAN) 0.005 % ophthalmic solution, Place 1 drop into the left eye at bedtime., Disp: , Rfl:    nitroGLYCERIN (NITROSTAT) 0.4 MG SL tablet, Place 1 tablet (0.4 mg total) under the tongue every 5 (five) minutes as needed for chest pain., Disp: 30 tablet, Rfl: 2   nystatin (MYCOSTATIN/NYSTOP) powder, Apply 1 application. topically 3 (three) times daily., Disp: 30 g, Rfl: 0   ondansetron (ZOFRAN) 4 MG tablet, Take 1 tablet (4 mg total) by mouth every 8 (eight) hours as needed for nausea or vomiting., Disp: 60 tablet, Rfl: 1   oxyCODONE-acetaminophen (PERCOCET/ROXICET) 5-325 MG tablet, Take 1 tablet by mouth daily as needed for severe pain (for use around physical  therapy sessions)., Disp: 5 tablet, Rfl: 0   pantoprazole (PROTONIX) 40 MG tablet, Take 1 tablet (40 mg total) by mouth daily., Disp: 90 tablet, Rfl: 2   polyethylene glycol powder (MIRALAX) 17 GM/SCOOP powder, Take 17 g by mouth daily as needed (constipation)., Disp: 255 g, Rfl: 0   PROAIR HFA 108 (90 Base) MCG/ACT inhaler, INHALE 1 TO 2 PUFFS BY MOUTH EVERY 6 HOURS AS NEEDED FOR WHEEZING OR SHORTNESS OF BREATH, Disp: 8.5 g, Rfl: 4   rOPINIRole (REQUIP) 1 MG tablet, Take 1 tablet by mouth at bedtime., Disp: , Rfl:    rosuvastatin (CRESTOR) 40 MG tablet, Take 1 tablet (40 mg total) by mouth daily., Disp: 90 tablet, Rfl: 3   SYMBICORT 160-4.5 MCG/ACT inhaler,  INHALE 2 PUFFS into THE lungs 2 TIMES DAILY, Disp: 10.2 g, Rfl: 4   terconazole (TERAZOL 7) 0.4 % vaginal cream, Apply to vulva as needed for yeast, Disp: 45 g, Rfl: 0   torsemide (DEMADEX) 20 MG tablet, Take 4 tablets (80 mg total) by mouth 2 (two) times daily., Disp: 720 tablet, Rfl: 3   traZODone (DESYREL) 100 MG tablet, TAKE 1 TABLET BY MOUTH AT BEDTIME AS NEEDED FOR SLEEP, Disp: 30 tablet, Rfl: 1   Vitamin D, Cholecalciferol, 25 MCG (1000 UT) CAPS, Take 1,000 Units by mouth daily., Disp: 90 capsule, Rfl: 3  Social History   Tobacco Use  Smoking Status Former   Packs/day: 0.25   Years: 15.00   Total pack years: 3.75   Types: Cigarettes   Quit date: 12/06/2005   Years since quitting: 16.8  Smokeless Tobacco Never  Tobacco Comments   smokes for a couple of months    Allergies  Allergen Reactions   Cefepime Other (See Comments)    AKI, see records from [place] hospitalization in January 2020.   Other reaction(s): Other (See Comments) "Shut down organs/kidneys"  Note pt has tolerated Rocephin and Keflex Other reaction(s): Not available   Iodine Other (See Comments)    Kidney dysfunction   Kiwi Extract Shortness Of Breath, Swelling and Anaphylaxis    Other reaction(s): Not available   Morphine And Related Nausea And Vomiting    Nalbuphine Other (See Comments)    Hallucinations   Other reaction(s): Not available   Pentoxifylline Nausea And Vomiting    Other reaction(s): Not available   Cephalosporins Other (See Comments)    Pt states that they cause her kidneys to shut down   Toradol [Ketorolac Tromethamine] Other (See Comments)    Feels like something is crawling on me   Morphine Nausea And Vomiting    Other reaction(s): Not available   Nubain [Nalbuphine Hcl] Other (See Comments)    "FEELS LIKE SOMETHING CRAWLING ON ME"   Objective:  There were no vitals filed for this visit. There is no height or weight on file to calculate BMI. Constitutional Well developed. Well nourished.  Vascular Foot warm and well perfused. Capillary refill normal to all digits.   Neurologic Normal speech. Oriented to person, place, and time. Epicritic sensation to light touch grossly present bilaterally.  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  TMA site has completely reepithelialized.  Orthopedic: No further tenderness to palpation noted about the surgical site.   Radiographs: 3 views of skeletally mature the right foot: Right partial first ray amputation site noted.  No signs of foreign body noted.  No signs of Deis infection noted no signs of osteomyelitis noted. Assessment:   1. Equinus contracture of right ankle     Plan:  Patient was evaluated and treated and all questions answered.  S/p foot surgery right -Clinically healed.  No signs of Deis is noted.  Right ankle capsulitis- -Clinically doing okay.  It is manageable.  Right ankle equinus -I explained to patient the etiology of equinus versus treatment options were discussed.  Given that patient has had transmetatarsal amputation.  Patient will benefit from Achilles tendon lengthening percutaneously.  I discussed the surgical options in extensive detail.  She states understand like to proceed with surgery I discussed the  preoperative intra postoperative plan in extensive detail.  We will plan on doing a right Achilles percutaneous lengthening. -Informed surgical risk consent was reviewed and read aloud to the patient.  I reviewed  the films.  I have discussed my findings with the patient in great detail.  I have discussed all risks including but not limited to infection, stiffness, scarring, limp, disability, deformity, damage to blood vessels and nerves, numbness, poor healing, need for braces, arthritis, chronic pain, amputation, death.  All benefits and realistic expectations discussed in great detail.  I have made no promises as to the outcome.  I have provided realistic expectations.  I have offered the patient a 2nd opinion, which they have declined and assured me they preferred to proceed despite the risks     No follow-ups on file.  Sclerosing

## 2022-10-14 ENCOUNTER — Other Ambulatory Visit: Payer: Commercial Managed Care - HMO

## 2022-10-14 ENCOUNTER — Telehealth: Payer: Self-pay | Admitting: Podiatry

## 2022-10-14 NOTE — Telephone Encounter (Signed)
DOS: 11/10/2022  [employer] Effective 12/06/2021 Medicaid [place] Access  Tendon-Achilles Lengthening Rt [phone])  DX: M24.571  Deductible: $0 Out-of-Pocket: $3,000 with $1,314.38 remaining CoInsurance: 10% Copay: $0  Prior authorization is not required per [employer].  Reference #: [id number]

## 2022-10-19 ENCOUNTER — Ambulatory Visit: Payer: Commercial Managed Care - HMO | Admitting: Physical Therapy

## 2022-10-19 ENCOUNTER — Telehealth: Payer: Self-pay | Admitting: Family Medicine

## 2022-10-19 NOTE — Telephone Encounter (Signed)
Patient called stating she has been trying to reach doctor. She said she is needing a script/letter for a ramp at front door. Patient stated it needs to pretty much state that patient needs a ramp to be able to get into her home.  She said it needed to be faxed to her CAPs case worker:  Neil Crouch Phone #: [phone] Fax #: [phone]  Please Advise.   Thanks!

## 2022-10-21 ENCOUNTER — Encounter: Payer: Self-pay | Admitting: Family Medicine

## 2022-10-21 NOTE — Telephone Encounter (Signed)
Letter written, will have it faxed to CAP worker [name], MD

## 2022-10-27 ENCOUNTER — Telehealth: Payer: Self-pay | Admitting: *Deleted

## 2022-10-27 NOTE — Telephone Encounter (Signed)
Patient is calling to ask when she will able to put pressure on foot after surgery, please advise.

## 2022-10-29 ENCOUNTER — Telehealth: Payer: Self-pay | Admitting: Family Medicine

## 2022-10-29 NOTE — Telephone Encounter (Signed)
**  AFTER HOURS EMERGENCY CALL**  Ms. Coffin is a 46 yo woman who is placed a call to the after-hours emergency line for gross hematuria.  Patient confirmed with name and date of birth.  She reports "peeing pure blood" starting today at about 12 PM.  She reports her urine is dark red and she cannot see through it.  She also reports pelvic pain at the end of urination (started recently) and a couple weeks duration of back pain (upper, lower, and sides per patient).  Denies burning with urination, fever, chills, dizziness, lightheadedness, headache, vision changes.  She is not on any blood thinners currently.  She has never had this happen before.  She does not believe she is ever been diagnosed with UTI, no history of recurrent or severe UTIs.  She does have history of CKD 3a and HTN.  Gross hematuria Differential is quite broad.  The top differentials based on this phone call include UTI, abnormal vaginal bleeding, and kidney stone. Given lack of red flags such as orthostasis or presyncope, I hope her vital signs are stable; however without a home blood pressure cuff or SpO2 monitor I cannot even evaluate these over the phone.  We discussed that I would prefer her to be evaluated within the next 24 hours.  She believes there were no urgent cares open at this time, but even tomorrow she does not have transportation to get to an urgent care.  She reports she would have to call an ambulance to get to the hospital.  Even though I do not believe this is an emergency worthy of an ambulance, she has enough comorbid conditions to make her a higher risk patient - uncontrolled T2DM, chronic respiratory failure, diastolic heart failure, CAD, PAD, CKD 3, HTN, and h/o bilateral amputations.  I instructed her to come to the hospital for further evaluation in the emergency room.  Ezequiel Essex, MD

## 2022-10-30 ENCOUNTER — Emergency Department (HOSPITAL_COMMUNITY)
Admission: EM | Admit: 2022-10-30 | Discharge: 2022-10-30 | Disposition: A | Discharge status: Home / Self Care | Payer: Commercial Managed Care - HMO | Attending: Emergency Medicine | Admitting: Emergency Medicine

## 2022-10-30 ENCOUNTER — Other Ambulatory Visit: Payer: Self-pay

## 2022-10-30 ENCOUNTER — Emergency Department (HOSPITAL_COMMUNITY): Payer: Commercial Managed Care - HMO

## 2022-10-30 ENCOUNTER — Encounter (HOSPITAL_COMMUNITY): Payer: Self-pay

## 2022-10-30 DIAGNOSIS — Z7982 Long term (current) use of aspirin: Secondary | ICD-10-CM | POA: Diagnosis not present

## 2022-10-30 DIAGNOSIS — Z794 Long term (current) use of insulin: Secondary | ICD-10-CM | POA: Diagnosis not present

## 2022-10-30 DIAGNOSIS — Z79899 Other long term (current) drug therapy: Secondary | ICD-10-CM | POA: Insufficient documentation

## 2022-10-30 DIAGNOSIS — N3091 Cystitis, unspecified with hematuria: Secondary | ICD-10-CM

## 2022-10-30 DIAGNOSIS — R319 Hematuria, unspecified: Secondary | ICD-10-CM | POA: Diagnosis present

## 2022-10-30 DIAGNOSIS — N3001 Acute cystitis with hematuria: Secondary | ICD-10-CM | POA: Diagnosis not present

## 2022-10-30 LAB — CBC WITH DIFFERENTIAL/PLATELET
Abs Immature Granulocytes: 0.12 10*3/uL — ABNORMAL HIGH (ref 0.00–0.07)
Basophils Absolute: 0.1 10*3/uL (ref 0.0–0.1)
Basophils Relative: 0 %
Eosinophils Absolute: 0.2 10*3/uL (ref 0.0–0.5)
Eosinophils Relative: 2 %
HCT: 38.1 % (ref 36.0–46.0)
Hemoglobin: 12 g/dL (ref 12.0–15.0)
Immature Granulocytes: 1 %
Lymphocytes Relative: 20 %
Lymphs Abs: 2.8 10*3/uL (ref 0.7–4.0)
MCH: 28.8 pg (ref 26.0–34.0)
MCHC: 31.5 g/dL (ref 30.0–36.0)
MCV: 91.6 fL (ref 80.0–100.0)
Monocytes Absolute: 1 10*3/uL (ref 0.1–1.0)
Monocytes Relative: 7 %
Neutro Abs: 9.7 10*3/uL — ABNORMAL HIGH (ref 1.7–7.7)
Neutrophils Relative %: 70 %
Platelets: 364 10*3/uL (ref 150–400)
RBC: 4.16 MIL/uL (ref 3.87–5.11)
RDW: 14.5 % (ref 11.5–15.5)
WBC: 13.9 10*3/uL — ABNORMAL HIGH (ref 4.0–10.5)
nRBC: 0 % (ref 0.0–0.2)

## 2022-10-30 LAB — BASIC METABOLIC PANEL
Anion gap: 10 (ref 5–15)
BUN: 43 mg/dL — ABNORMAL HIGH (ref 6–20)
CO2: 31 mmol/L (ref 22–32)
Calcium: 8.5 mg/dL — ABNORMAL LOW (ref 8.9–10.3)
Chloride: 96 mmol/L — ABNORMAL LOW (ref 98–111)
Creatinine, Ser: 1.57 mg/dL — ABNORMAL HIGH (ref 0.44–1.00)
GFR, Estimated: 41 mL/min — ABNORMAL LOW (ref >60)
Glucose, Bld: 268 mg/dL — ABNORMAL HIGH (ref 70–99)
Potassium: 3.9 mmol/L (ref 3.5–5.1)
Sodium: 137 mmol/L (ref 135–145)

## 2022-10-30 LAB — PREGNANCY, URINE: Preg Test, Ur: NEGATIVE

## 2022-10-30 LAB — URINALYSIS, ROUTINE W REFLEX MICROSCOPIC
Bacteria, UA: NONE SEEN
RBC / HPF: >50 RBC/hpf — ABNORMAL HIGH (ref 0–5)

## 2022-10-30 MED ORDER — FENTANYL CITRATE PF 50 MCG/ML IJ SOSY
100.0000 ug | PREFILLED_SYRINGE | Freq: Once | INTRAMUSCULAR | Status: AC
  Administered 2022-10-30: 100 ug via INTRAVENOUS
  Filled 2022-10-30: qty 2

## 2022-10-30 MED ORDER — SODIUM CHLORIDE 0.9 % IV SOLN
1.0000 g | Freq: Once | INTRAVENOUS | Status: AC
  Administered 2022-10-30: 1 g via INTRAVENOUS
  Filled 2022-10-30: qty 10

## 2022-10-30 MED ORDER — HYDROCODONE-ACETAMINOPHEN 5-325 MG PO TABS: 1.0000 | ORAL_TABLET | Freq: Four times a day (QID) | ORAL | 0 refills | Status: DC | PRN

## 2022-10-30 MED ORDER — CEPHALEXIN 500 MG PO CAPS: 500.0000 mg | ORAL_CAPSULE | Freq: Three times a day (TID) | ORAL | 0 refills | Status: DC

## 2022-10-30 NOTE — ED Notes (Signed)
Patient transported to CT 

## 2022-10-30 NOTE — ED Provider Notes (Signed)
[facility] DEPT Provider Note   CSN: [id number] Arrival date & time: 10/30/22  0006     History  Chief Complaint  Patient presents with   Hematuria    [name] is a 46 y.o. female.  The history is provided by the patient.  Patient with multiple medical conditions presents with back pain and hematuria.  She reports she has had ongoing back pain for several days.  No recent falls or injuries.  Over the past day she has had lower abdominal and pelvic pain and hematuria.  No vaginal bleeding.  No fevers or vomiting.  Patient has a history of chronic respiratory failure and reports her pulse ox is typically 88 to 90%     Home Medications Prior to Admission medications   Medication Sig Start Date End Date Taking? Authorizing Provider  cephALEXin (KEFLEX) 500 MG capsule Take 1 capsule (500 mg total) by mouth 3 (three) times daily. 10/30/22  Yes Ripley Fraise, MD  HYDROcodone-acetaminophen (NORCO/VICODIN) 5-325 MG tablet Take 1 tablet by mouth every 6 (six) hours as needed for severe pain. 10/30/22  Yes Ripley Fraise, MD  acetaminophen (TYLENOL) 325 MG tablet Take 1-2 tablets (325-650 mg total) by mouth every 4 (four) hours as needed for mild pain. 06/04/22   Setzer, Edman Circle, PA-C  amLODipine (NORVASC) 10 MG tablet Take 1 tablet (10 mg total) by mouth daily. 09/27/22   [name], MD  aspirin EC 81 MG tablet Take 1 tablet (81 mg total) by mouth daily. Swallow whole. 11/19/20   Sherren Mocha, MD  atropine 1 % ophthalmic solution Place 1 drop into the right eye daily.    [provider]  Brinzolamide-Brimonidine [facility]) 1-0.2 % SUSP Place 1 drop into the left eye in the morning, at noon, and at bedtime.    [provider]  cetirizine (ZYRTEC) 10 MG tablet Take 1 tablet (10 mg total) by mouth daily as needed for allergies. 02/11/22   [name], MD  Continuous Blood Gluc Sensor (DEXCOM G6 SENSOR) MISC Inject 1  applicator into the skin as directed. Change sensor every 10 days. 11/05/21   [name], MD  Continuous Blood Gluc Transmit (DEXCOM G6 TRANSMITTER) MISC Inject 1 Device into the skin as directed. Reuse 8 times with sensor changes. 09/03/21   [name], MD  DULoxetine (CYMBALTA) 60 MG capsule TAKE 2 CAPSULES BY MOUTH DAILY 09/15/22   Lenoria Chime, MD  ergocalciferol (VITAMIN D2) 1.25 MG (50000 UT) capsule     [provider]  gabapentin (NEURONTIN) 600 MG tablet TAKE 2 TABLETS BY MOUTH 3 TIMES DAILY 08/13/22   [name], MD  GNP ULTICARE PEN NEEDLES 32G X 4 MM MISC USE TO inject insulin 2 TIMES DAILY 10/02/21   [name], MD  insulin aspart (NOVOLOG FLEXPEN) 100 UNIT/ML FlexPen Inject 20 Units into the skin 3 (three) times daily with meals. Patient taking differently: Inject 30 Units into the skin 3 (three) times daily with meals. 05/25/22   Alcus Dad, MD  insulin degludec (TRESIBA FLEXTOUCH) 200 UNIT/ML FlexTouch Pen Inject 52 Units into the skin 2 (two) times daily. Patient taking differently: Inject 60 Units into the skin 2 (two) times daily. 06/04/22   Setzer, Edman Circle, PA-C  Insulin Disposable Pump (OMNIPOD 5 G6 POD, GEN 5,) MISC Inject into the skin daily. 07/30/22   [provider]  ipratropium-albuterol (DUONEB) 0.5-2.5 (3) MG/3ML SOLN Take 3 mLs by nebulization every 6 (six)  hours as needed. 09/24/22   Spero Geralds, MD  latanoprost (XALATAN) 0.005 % ophthalmic solution Place 1 drop into the left eye at bedtime. 06/29/22   [provider]  nitroGLYCERIN (NITROSTAT) 0.4 MG SL tablet Place 1 tablet (0.4 mg total) under the tongue every 5 (five) minutes as needed for chest pain. 09/27/22   [name], MD  nystatin (MYCOSTATIN/NYSTOP) powder Apply 1 application. topically 3 (three) times daily. 03/30/22   [name], MD  ondansetron (ZOFRAN) 4 MG tablet Take 1 tablet (4 mg total) by mouth every 8 (eight)  hours as needed for nausea or vomiting. 02/17/22   Lovorn, Jinny Blossom, MD  pantoprazole (PROTONIX) 40 MG tablet Take 1 tablet (40 mg total) by mouth daily. 09/27/22   [name], MD  polyethylene glycol powder [facility]) 17 GM/SCOOP powder Take 17 g by mouth daily as needed (constipation). 09/27/22   [name], MD  PROAIR HFA 108 [phone] Base) MCG/ACT inhaler INHALE 1 TO 2 PUFFS BY MOUTH EVERY 6 HOURS AS NEEDED FOR WHEEZING OR SHORTNESS OF BREATH 10/05/22   [name], MD  rOPINIRole (REQUIP) 1 MG tablet Take 1 tablet by mouth at bedtime.    [provider]  rosuvastatin (CRESTOR) 40 MG tablet Take 1 tablet (40 mg total) by mouth daily. 10/05/22   [name], MD  SYMBICORT 160-4.5 MCG/ACT inhaler INHALE 2 PUFFS into THE lungs 2 TIMES DAILY 09/24/22   [name], MD  terconazole (TERAZOL 7) 0.4 % vaginal cream Apply to vulva as needed for yeast 08/19/22   [name], MD  torsemide (DEMADEX) 20 MG tablet Take 4 tablets (80 mg total) by mouth 2 (two) times daily. 09/02/22   Sherren Mocha, MD  traZODone (DESYREL) 100 MG tablet TAKE 1 TABLET BY MOUTH AT BEDTIME AS NEEDED FOR SLEEP 09/15/22   Lenoria Chime, MD  Vitamin D, Cholecalciferol, 25 MCG (1000 UT) CAPS Take 1,000 Units by mouth daily. 10/05/22   [name], MD      Allergies    Cefepime, Iodine, Kiwi extract, Morphine and related, Nalbuphine, Pentoxifylline, Cephalosporins, Toradol [ketorolac tromethamine], Morphine, and Nubain [nalbuphine hcl]    Review of Systems   Review of Systems  Constitutional:  Negative for fever.  Genitourinary:  Positive for hematuria. Negative for vaginal bleeding.  Musculoskeletal:  Positive for back pain.    Physical Exam Updated Vital Signs BP 114/74 (BP Location: Right Arm)   Pulse 95   Temp 98.5 F (36.9 C) (Oral)   Resp 18   Ht 1.575 m ('5\' 2"'$ )   Wt (!) 155.6 kg   LMP 09/01/2022 Comment: waiver signed  SpO2 94%   BMI 62.74  kg/m  Physical Exam CONSTITUTIONAL: Chronically ill-appearing, appears older than stated age HEAD: Normocephalic/atraumatic EYES: EOMI/PERRL ENMT: Mucous membranes moist NECK: supple no meningeal signs SPINE/BACK:entire spine nontender CV: S1/S2 noted, no murmurs/rubs/gallops noted LUNGS: Lungs are clear to auscultation bilaterally, no apparent distress ABDOMEN: soft, significant obesity, tenderness noted lower abdomen but limited due to body size.  Patient has a significant pannus but no underlying evidence of cellulitis, no crepitus, no discharge GU: External genitalia exam performed with nurse present.  No erythema, no crepitus.  No external lesions are noted. NEURO: Pt is awake/alert/appropriate SKIN: warm, color normal  ED Results / Procedures / Treatments   Labs (all labs ordered are listed, but only abnormal results are displayed) Labs Reviewed  URINALYSIS, ROUTINE W REFLEX MICROSCOPIC - Abnormal; Notable for  the following components:      Result Value   Color, Urine RED (*)    APPearance TURBID (*)    Glucose, UA   (*)    Value: TEST NOT REPORTED DUE TO COLOR INTERFERENCE OF URINE PIGMENT   Hgb urine dipstick   (*)    Value: TEST NOT REPORTED DUE TO COLOR INTERFERENCE OF URINE PIGMENT   Bilirubin Urine   (*)    Value: TEST NOT REPORTED DUE TO COLOR INTERFERENCE OF URINE PIGMENT   Ketones, ur   (*)    Value: TEST NOT REPORTED DUE TO COLOR INTERFERENCE OF URINE PIGMENT   Protein, ur   (*)    Value: TEST NOT REPORTED DUE TO COLOR INTERFERENCE OF URINE PIGMENT   Nitrite   (*)    Value: TEST NOT REPORTED DUE TO COLOR INTERFERENCE OF URINE PIGMENT   Leukocytes,Ua   (*)    Value: TEST NOT REPORTED DUE TO COLOR INTERFERENCE OF URINE PIGMENT   RBC / HPF >50 (*)    All other components within normal limits  BASIC METABOLIC PANEL - Abnormal; Notable for the following components:   Chloride 96 (*)    Glucose, Bld 268 (*)    BUN 43 (*)    Creatinine, Ser 1.57 (*)    Calcium  8.5 (*)    GFR, Estimated 41 (*)    All other components within normal limits  CBC WITH DIFFERENTIAL/PLATELET - Abnormal; Notable for the following components:   WBC 13.9 (*)    Neutro Abs 9.7 (*)    Abs Immature Granulocytes 0.12 (*)    All other components within normal limits  URINE CULTURE  PREGNANCY, URINE    EKG None  Radiology CT Renal Stone Study  Result Date: 10/30/2022 CLINICAL DATA:  Abdominal pain and flank pain. Evaluate for kidney stone. EXAM: CT ABDOMEN AND PELVIS WITHOUT CONTRAST TECHNIQUE: Multidetector CT imaging of the abdomen and pelvis was performed following the standard protocol without IV contrast. RADIATION DOSE REDUCTION: This exam was performed according to the departmental dose-optimization program which includes automated exposure control, adjustment of the mA and/or kV according to patient size and/or use of iterative reconstruction technique. COMPARISON:  04/03/2021 FINDINGS: Lower chest: No pleural fluid or airspace disease. Hepatobiliary: Hypertrophy of the caudate lobe and lateral segment of left lobe of liver identified. No focal liver abnormality. Multiple tiny stones noted within the dependent portion of the gallbladder all measuring less than 5 mm. No gallbladder wall thickening or inflammation. No signs of bile duct dilatation. Pancreas: Unremarkable. No pancreatic ductal dilatation or surrounding inflammatory changes. Spleen: Normal in size without focal abnormality. Adrenals/Urinary Tract: Unchanged right adrenal gland adenoma measuring 2.9 cm, image 31/2. No follow-up imaging recommended. No kidney mass, nephrolithiasis, or hydronephrosis. No hydroureter or signs of ureteral lithiasis. Partially decompressed urinary bladder with marked diffuse wall thickening which measures up to 2.2 cm. Stomach/Bowel: Stomach appears normal. The appendix is visualized and is unremarkable. No pathologic dilatation of the large or small bowel loops. Vascular/Lymphatic:  Aortic atherosclerotic calcifications. No signs of abdominopelvic adenopathy. Reproductive: Uterus and bilateral adnexa are unremarkable. Other: No free fluid or fluid collections identified. Musculoskeletal: There is diffuse skin thickening with subcutaneous soft tissues stranding throughout the ventral abdominal wall and pannus. No focal fluid collection identified. No acute osseous findings. IMPRESSION: 1. No evidence for nephrolithiasis or hydronephrosis. 2. Marked diffuse wall thickening of the urinary bladder. Correlate for any clinical signs or symptoms of cystitis. 3. Diffuse skin thickening  with subcutaneous soft tissues stranding throughout the ventral abdominal wall and pannus. Correlate for any clinical signs or symptoms of cellulitis. 4. Gallstones. 5. Unchanged right adrenal gland adenoma. No follow-up imaging recommended. 6.  Aortic Atherosclerosis (ICD10-I70.0). Electronically Signed   By: Kerby Moors M.D.   On: 10/30/2022 05:49    Procedures Procedures    Medications Ordered in ED Medications  fentaNYL (SUBLIMAZE) injection 100 mcg (100 mcg Intravenous Given 10/30/22 0144)  cefTRIAXone (ROCEPHIN) 1 g in sodium chloride 0.9 % 100 mL IVPB (0 g Intravenous Stopped 10/30/22 0425)  fentaNYL (SUBLIMAZE) injection 100 mcg (100 mcg Intravenous Given 10/30/22 0541)    ED Course/ Medical Decision Making/ A&P Clinical Course as of 10/30/22 0614  Sat Oct 30, 2022  0307 WBC(!): 13.9 Leukocytosis [DW]  0307 Creatinine(!): 1.57 Renal insufficiency [DW]  0307 Glucose(!): 268 Hyperglycemia [DW]  0612 Patient stable.  CT imaging does not reveal any obstructing stones.  No other acute abdominal or urologic emergency.  Findings likely consistent with hemorrhagic cystitis.  Patient's GFR is at baseline.  She will be given Keflex 3 times daily.  She reports she has tolerated Keflex in the past and this has been documented in her chart.  She is not septic appearing.  She is safe for discharge  home.  She is not vomiting.  She already has PCP follow-up next week.  We discussed strict return precautions.  Short course of pain medicine has been provided [DW]    Clinical Course User Index [DW] Ripley Fraise, MD                           Medical Decision Making Amount and/or Complexity of Data Reviewed Labs: ordered. Decision-making details documented in ED Course. Radiology: ordered.  Risk Prescription drug management.   This patient presents to the ED for concern of back pain and hematuria, this involves an extensive number of treatment options, and is a complaint that carries with it a high risk of complications and morbidity.  The differential diagnosis includes but is not limited to hemorrhagic cystitis, pyelonephritis, vaginal bleeding  Comorbidities that complicate the patient evaluation: Patient's presentation is complicated by their history of obesity and COPD  Social Determinants of Health: Patient's  poor mobility   increases the complexity of managing their presentation  Additional history obtained: Records reviewed  outpatient records reviewed  Lab Tests: I Ordered, and personally interpreted labs.  The pertinent results include: Leukocytosis  Imaging Studies ordered: I ordered imaging studies including CT scan abdomen pelvis   I independently visualized and interpreted imaging which showed cystitis, no ureteral stone I agree with the radiologist interpretation  Cardiac Monitoring: The patient was maintained on a cardiac monitor.  I personally viewed and interpreted the cardiac monitor which showed an underlying rhythm of:  sinus rhythm  Medicines ordered and prescription drug management: I ordered medication including fentanyl for pain Rocephin for UTI Reevaluation of the patient after these medicines showed that the patient    improved   Critical Interventions:   IV antibiotics   Reevaluation: After the interventions noted above, I reevaluated  the patient and found that they have :improved  Complexity of problems addressed: Patient's presentation is most consistent with  acute presentation with potential threat to life or bodily function  Disposition: After consideration of the diagnostic results and the patient's response to treatment,  I feel that the patent would benefit from discharge   .  Final Clinical Impression(s) / ED Diagnoses Final diagnoses:  Hemorrhagic cystitis    Rx / DC Orders ED Discharge Orders          Ordered    HYDROcodone-acetaminophen (NORCO/VICODIN) 5-325 MG tablet  Every 6 hours PRN        10/30/22 0612    cephALEXin (KEFLEX) 500 MG capsule  3 times daily        10/30/22 9987              Ripley Fraise, MD 10/30/22 [phone]

## 2022-10-30 NOTE — ED Notes (Signed)
Attempted to collect a UA from patient, Lab states that there is not enough for all labs ordered, will recollect

## 2022-10-30 NOTE — ED Notes (Signed)
Notified Dr. Christy Gentles about the patients request for increased dosage of pain medication for discharge.

## 2022-10-30 NOTE — ED Triage Notes (Signed)
Patient is compliaing of hemtura and dysuria, the patient states this has been gping on for two days. The patient had a incontinent episode while on the EMS truck and EMS states that the uria is bright-red. The patient has a hx of COPD and her oxygen is normally 87%.

## 2022-10-31 LAB — URINE CULTURE: Culture: 30000 — AB

## 2022-11-01 ENCOUNTER — Telehealth (HOSPITAL_BASED_OUTPATIENT_CLINIC_OR_DEPARTMENT_OTHER): Payer: Self-pay | Admitting: Emergency Medicine

## 2022-11-01 NOTE — Progress Notes (Signed)
ED Antimicrobial Stewardship Positive Culture Follow Up   [name] is an 46 y.o. female who presented to [facility] on 10/30/2022 with a chief complaint of  Chief Complaint  Patient presents with   Hematuria    Recent Results (from the past 720 hour(s))  Urine Culture     Status: Abnormal   Collection Time: 10/30/22 12:53 AM   Specimen: Urine, Clean Catch  Result Value Ref Range Status   Specimen Description   Final    URINE, CLEAN CATCH Performed at [facility], [place] [address], [place], [place] [address]    Special Requests   Final    NONE Performed at [facility], [place] [address], [place], [place] [address]    Culture (A)  Final    30,000 COLONIES/mL STREPTOCOCCUS AGALACTIAE TESTING AGAINST S. AGALACTIAE NOT ROUTINELY PERFORMED DUE TO PREDICTABILITY OF AMP/PEN/VAN SUSCEPTIBILITY. Performed at [facility], [place] [address], [place], [place] [address]    Report Status 10/31/2022 FINAL  Final    '[x]'$  Treated with Keflex 500 mg TID PO x 7 days, but antibiotic therapy may not be necessary  '[]'$  Patient discharged originally without antimicrobial agent and treatment is now indicated  18 YOF presenting with hematuria, dysuria, back pain, and leukocytosis found to have hemorrhagic cystitis. Difficult to determine clinical significance of UA given presence of blood. Hemorrhagic cystitis can sometimes be caused by bacteria, but not always. Fill records do not show that patient has picked up the prescribed discharge antibiotic.   The plan is to call the patient for a symptom check. If she is feeling better or the same and has started the antibiotic course, it is reasonable to finish the course. If the patient is feeling better and has not started her antibiotic course, it is reasonable not to start the course. Whether the patient is feeling better or worse, she should follow up with her PCP per original discharge recommendations.   Discussed  case with ED Provider: Tretha Sciara, MD   Vicenta Dunning, PharmD  PGY1 Pharmacy Resident    Monday - Friday phone -  [phone] Saturday - Sunday phone - [phone]

## 2022-11-01 NOTE — Telephone Encounter (Signed)
Post ED Visit - Positive Culture Follow-up: Successful Patient Follow-Up  Culture assessed and recommendations reviewed by:  '[]'$  Elenor Quinones, Pharm.D. '[]'$  Heide Guile, Pharm.D., BCPS AQ-ID '[]'$  Parks Neptune, Pharm.D., BCPS '[]'$  Alycia Rossetti, Pharm.D., BCPS '[]'$  [place], Pharm.D., BCPS, AAHIVP '[]'$  Legrand Como, Pharm.D., BCPS, AAHIVP '[]'$  Salome Arnt, PharmD, BCPS '[]'$  Johnnette Gourd, PharmD, BCPS '[]'$  [name], PharmD, BCPS '[]'$  Leeroy Cha, PharmD Marvell Fuller PharmD  Positive urine culture  '[]'$  Patient discharged without antimicrobial prescription and treatment is now indicated '[]'$  Organism is resistant to prescribed ED discharge antimicrobial '[]'$  Patient with positive blood cultures  Changes discussed with ED provider: Tretha Sciara MD New antibiotic prescription start keflex and prescribed Followup with PCP  Contacted patient, date t, time n   Hazle Nordmann 11/01/2022, 2:56 PM

## 2022-11-04 ENCOUNTER — Other Ambulatory Visit: Payer: Self-pay

## 2022-11-04 ENCOUNTER — Encounter: Payer: Self-pay | Admitting: Family Medicine

## 2022-11-04 ENCOUNTER — Ambulatory Visit (INDEPENDENT_AMBULATORY_CARE_PROVIDER_SITE_OTHER): Payer: Commercial Managed Care - HMO | Admitting: Family Medicine

## 2022-11-04 VITALS — BP 130/73 | HR 107

## 2022-11-04 DIAGNOSIS — E785 Hyperlipidemia, unspecified: Secondary | ICD-10-CM

## 2022-11-04 DIAGNOSIS — E1140 Type 2 diabetes mellitus with diabetic neuropathy, unspecified: Secondary | ICD-10-CM | POA: Diagnosis not present

## 2022-11-04 DIAGNOSIS — I2510 Atherosclerotic heart disease of native coronary artery without angina pectoris: Secondary | ICD-10-CM

## 2022-11-04 DIAGNOSIS — Z794 Long term (current) use of insulin: Secondary | ICD-10-CM

## 2022-11-04 DIAGNOSIS — E1169 Type 2 diabetes mellitus with other specified complication: Secondary | ICD-10-CM

## 2022-11-04 DIAGNOSIS — G8929 Other chronic pain: Secondary | ICD-10-CM

## 2022-11-04 DIAGNOSIS — Z79899 Other long term (current) drug therapy: Secondary | ICD-10-CM | POA: Diagnosis not present

## 2022-11-04 DIAGNOSIS — N1831 Chronic kidney disease, stage 3a: Secondary | ICD-10-CM | POA: Diagnosis not present

## 2022-11-04 MED ORDER — NITROGLYCERIN 0.4 MG SL SUBL: 0.4000 mg | SUBLINGUAL_TABLET | SUBLINGUAL | 2 refills | Status: DC | PRN

## 2022-11-04 MED ORDER — ROSUVASTATIN CALCIUM 40 MG PO TABS: 40.0000 mg | ORAL_TABLET | Freq: Every day | ORAL | 3 refills | Status: DC

## 2022-11-04 NOTE — Progress Notes (Signed)
  Date of Visit: 11/04/2022   SUBJECTIVE:   HPI:  [name] presents today for follow up.  Requests we check her renal function today. Seen in ED within last week for hematuria, diagnosed with hemorrhagic cystitis. Taking keflex. Feels improved, urine no longer has blood in it. She was given an rx for hydrocodone at the ED.  Chronic pain - after last visit I prescribed oxycodone '5mg'$  tablets for her to use on days she has physical therapy. This has helped with pain, but she deals with pain on other days because she has to do exercises at home every day. Needs new referral back to physical therapy. Denies any illicit drug use.  Needs new rx for nitroglycerin to have on hand, hers is lost or expired  Hyperlipidemia - has not started crestor, never got it from pharmacy, needs new rx  She will also be having surgery by podiatry to stretch a tendon. 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 Reports she has fallen several times at home due to pain her legs, says they just give out due to hurting so badly.  OBJECTIVE:   BP 130/73   Pulse (!) 107   SpO2 95%  Gen: no acute distress, pleasant, cooperative HEENT: normocephalic, atraumatic  Heart: mildly tachycardic, regular rhythm Lungs: clear to auscultation bilaterally, normal work of breathing  Neuro: alert, speech normal, grossly nonfocal  ASSESSMENT/PLAN:   Health maintenance:  -declines COVID booster today but states she will be agreeable to getting it at another time  Encounter for chronic pain management PDMP reviewed, appropriate. One rx from ED for 5 tablets of hydrocodone, patient was forthcoming about this. Denies further illicit drug use. UDS today. If UDS appropriate I will prescribe oxycodone '5mg'$  daily for relief with her exercises from physical therapy. Follow up in office in 1 month.  Nonobstructive atherosclerosis of coronary artery Rx for nitroglycerin sent in to have on hand, no current cardiac symptoms.   Chronic kidney disease,  stage 3a ([place]) Check renal function today with bmet  Hyperlipidemia associated with type 2 diabetes mellitus ([place]) Resent rx in for crestor  FOLLOW UP: Follow up in 1 mo for chronic pain  [name], [place]

## 2022-11-04 NOTE — Patient Instructions (Signed)
It was great to see you again today!  Checking kidney function today Once urine drug screen returns will discuss sending in one pain medication tablet daily Continue to abstain from drugs not prescribed to you  Sent in refills of crestor and nitroglycerin  Follow up with me in January  Be well, Dr. [name]

## 2022-11-05 ENCOUNTER — Encounter: Payer: Self-pay | Admitting: Family Medicine

## 2022-11-05 DIAGNOSIS — F1490 Cocaine use, unspecified, uncomplicated: Secondary | ICD-10-CM | POA: Insufficient documentation

## 2022-11-05 LAB — BASIC METABOLIC PANEL
BUN/Creatinine Ratio: 28 — ABNORMAL HIGH (ref 9–23)
BUN: 43 mg/dL — ABNORMAL HIGH (ref 6–24)
CO2: 31 mmol/L — ABNORMAL HIGH (ref 20–29)
Calcium: 8.8 mg/dL (ref 8.7–10.2)
Chloride: 95 mmol/L — ABNORMAL LOW (ref 96–106)
Creatinine, Ser: 1.56 mg/dL — ABNORMAL HIGH (ref 0.57–1.00)
Glucose: 215 mg/dL — ABNORMAL HIGH (ref 70–99)
Potassium: 3.8 mmol/L (ref 3.5–5.2)
Sodium: 143 mmol/L (ref 134–144)
eGFR: 42 mL/min/{1.73_m2} — ABNORMAL LOW (ref >59)

## 2022-11-05 NOTE — Assessment & Plan Note (Signed)
Check renal function today with bmet

## 2022-11-05 NOTE — Assessment & Plan Note (Signed)
Rx for nitroglycerin sent in to have on hand, no current cardiac symptoms.

## 2022-11-05 NOTE — Assessment & Plan Note (Signed)
PDMP reviewed, appropriate. One rx from ED for 5 tablets of hydrocodone, patient was forthcoming about this. Denies further illicit drug use. UDS today. If UDS appropriate I will prescribe oxycodone '5mg'$  daily for relief with her exercises from physical therapy. Follow up in office in 1 month.

## 2022-11-05 NOTE — Assessment & Plan Note (Signed)
Resent rx in for crestor

## 2022-11-07 LAB — TOXASSURE SELECT 13 (MW), URINE

## 2022-11-08 ENCOUNTER — Ambulatory Visit: Payer: Medicaid Other | Admitting: Dietician

## 2022-11-09 ENCOUNTER — Telehealth: Payer: Self-pay | Admitting: Family Medicine

## 2022-11-09 MED ORDER — OXYCODONE-ACETAMINOPHEN 5-325 MG PO TABS: 1.0000 | ORAL_TABLET | Freq: Every day | ORAL | 0 refills | Status: DC | PRN

## 2022-11-09 NOTE — Telephone Encounter (Signed)
Patient called stating that she really needs her pain medication refilled please

## 2022-11-09 NOTE — Telephone Encounter (Signed)
UDS was appropriate. Rx sent. [name], MD

## 2022-11-10 ENCOUNTER — Ambulatory Visit: Payer: Commercial Managed Care - HMO | Admitting: Podiatry

## 2022-11-11 ENCOUNTER — Telehealth: Payer: Self-pay

## 2022-11-11 NOTE — Telephone Encounter (Signed)
Patient calls nurse line in regards to Oxycodone.   She reports PCP was to call in Oxycodone 7.5-325 vs Oxycodone 5-'325mg'$  that was called in.   Patient reports she did pick up the prescription, however she is going to run out before 30 days. She reports taking 1.5 tabs a day.   Patient would like to get 7.5-'325mg'$  when she runs out of 5-'325mg'$ .   Will forward to PCP.

## 2022-11-11 NOTE — Telephone Encounter (Signed)
The prescribed dose is '5mg'$ , not 7.'5mg'$  - we did not discuss going up on the dose. She needs to take '5mg'$ . Please inform patient.  [name], MD

## 2022-11-12 NOTE — Telephone Encounter (Signed)
Patient contacted.   Patient advised prescription written for 5-'325mg'$  is the intended prescription. Patient advised this should last one month.   She reports she will FU with PCP on 12/16/2022.

## 2022-11-18 ENCOUNTER — Encounter: Payer: Commercial Managed Care - HMO | Admitting: Podiatry

## 2022-11-18 ENCOUNTER — Telehealth: Payer: Self-pay | Admitting: Internal Medicine

## 2022-11-18 ENCOUNTER — Other Ambulatory Visit: Payer: Self-pay | Admitting: Family Medicine

## 2022-11-18 NOTE — Telephone Encounter (Signed)
Patient called regarding a POC that she stated Dr. Shearon Stalls was working on for her.  She would like to get an update.  Please call patient to discuss at [phone]

## 2022-11-18 NOTE — Telephone Encounter (Signed)
Called and spoke with patient. She stated that she was calling to check on the status of her POC. I advised her that the order had been sent to [employer] back in October 2023. She has not heard anything from [employer]. I advised her that I would call to check on this.   Called [employer] and spoke with Chassidy. She stated that they received the nebulizer order but not the POC order. She advised that we resend the order.   Called and spoke with the patient and provided her with an update. She verbalized understanding.   PCCs, can we re-fax the POC order from 10/20? Thank you!

## 2022-11-22 NOTE — Telephone Encounter (Signed)
Eulas Post, [place], [place]; Conrad Obion,  I have notes from October advising that a message was left for triage nurse to let them know that the order was not written for a POC, & since it is from Oct, we would need updated SATS as well that show pt qualified for POC.  Thanks!

## 2022-11-22 NOTE — Telephone Encounter (Signed)
Sent message to [employer]

## 2022-11-23 NOTE — Telephone Encounter (Signed)
Spoke with the pt and notified needs appt for POC qualification  She verbalized understanding  I have scheduled her for 12/09/22 Nothing further needed

## 2022-11-25 IMAGING — DX DG CHEST 1V PORT
1 series · 1 of 1 positions shown · non-contrast
Comparison: May 05, 2021.

CLINICAL DATA: Chest pain.

EXAM:
PORTABLE CHEST 1 VIEW

[chest ap]
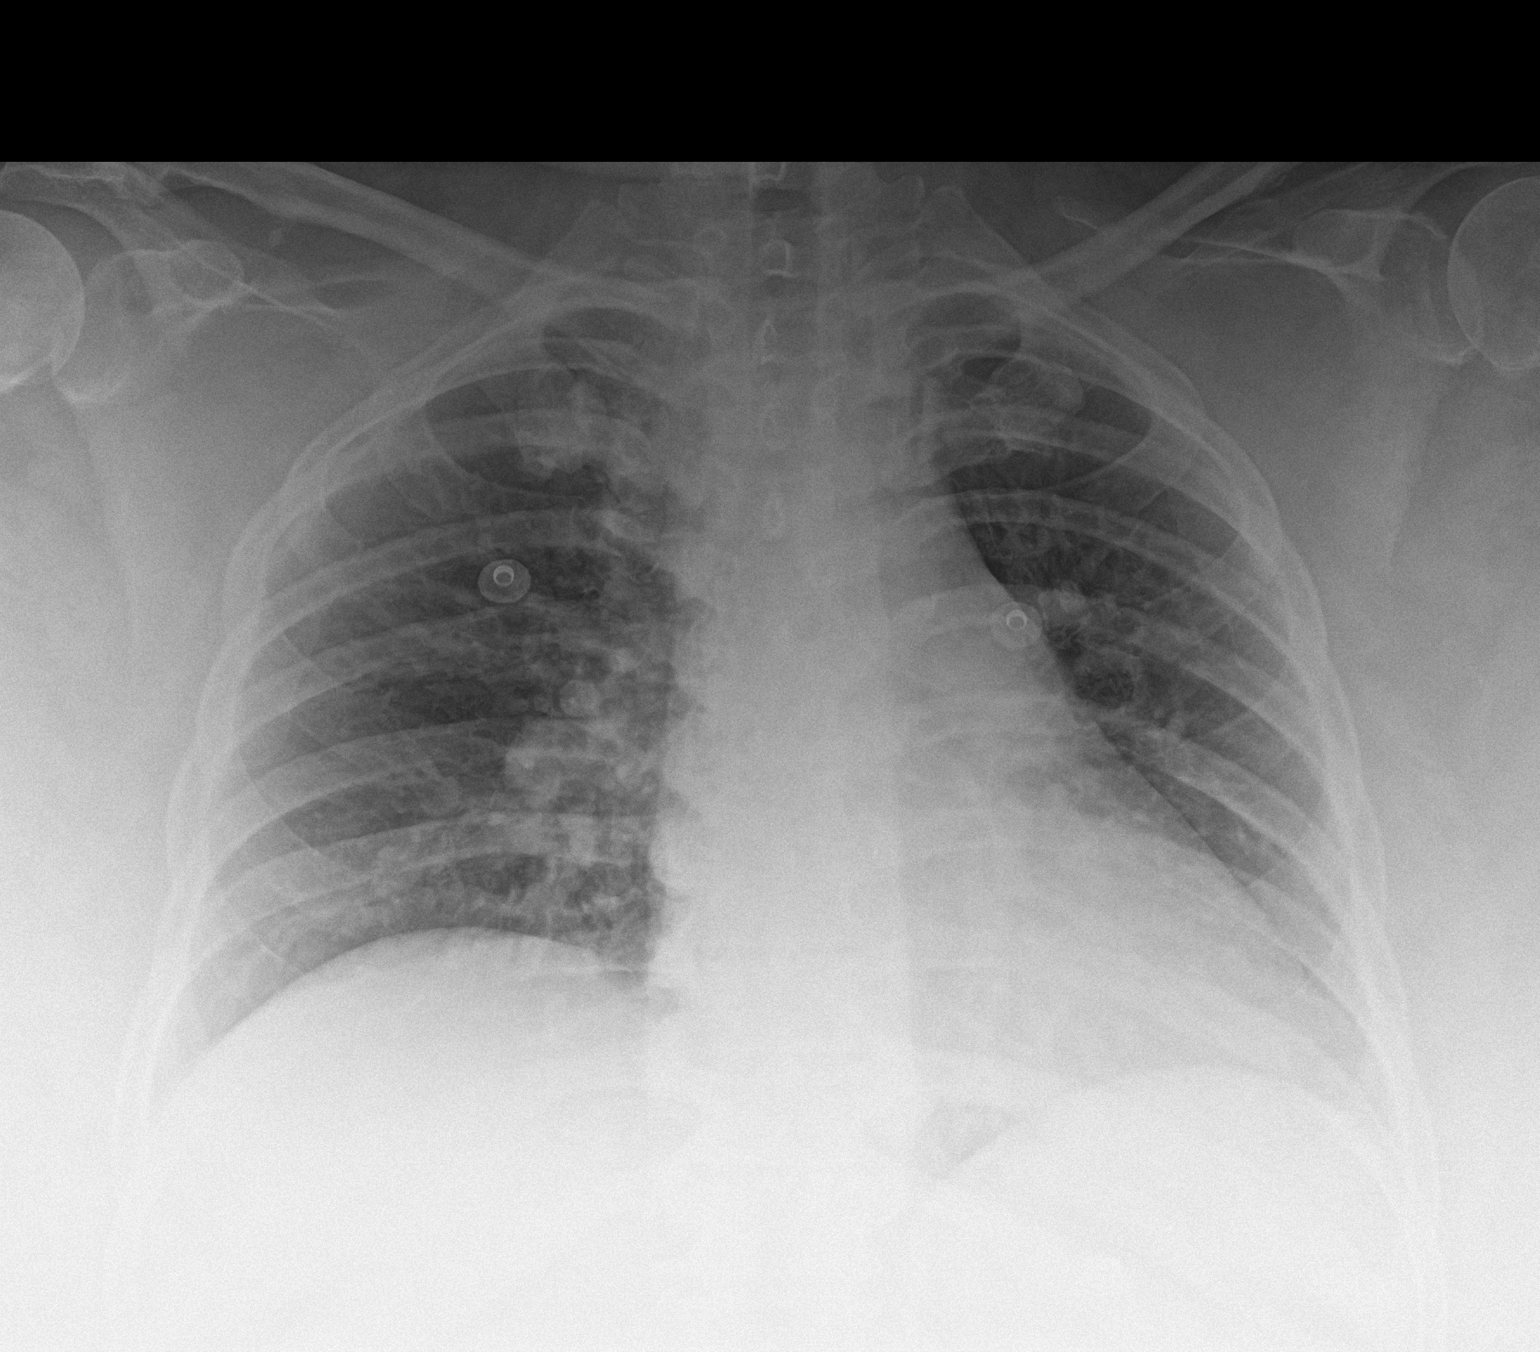

[1 of 1 positions shown; findings below may reference images not displayed]

FINDINGS: The heart size and mediastinal contours are within normal limits.
Both lungs are clear. The visualized skeletal structures are
unremarkable.
IMPRESSION: No active disease.

## 2022-11-25 IMAGING — CT CT PELVIS W/ CM
2 of 6 series · 14 of 46 positions shown, 18 images · IV contrast (APPLIED)
Comparison: CT examination dated April 03, 2021

CLINICAL DATA: Abdominal abscess/infection suspected.

EXAM:
CT PELVIS WITH CONTRAST
TECHNIQUE: Multidetector CT imaging of the pelvis was performed using the
standard protocol following the bolus administration of intravenous
contrast.
CONTRAST:  100mL OMNIPAQUE IOHEXOL 350 MG/ML SOLN

[Series 4: pelvis with 5.0 i30f 2 · axial · 0.95mm/px · z∈[+633,+888]mm · 11 of 60 slices shown, 15 images]
[im 6/60  soft-tissue]
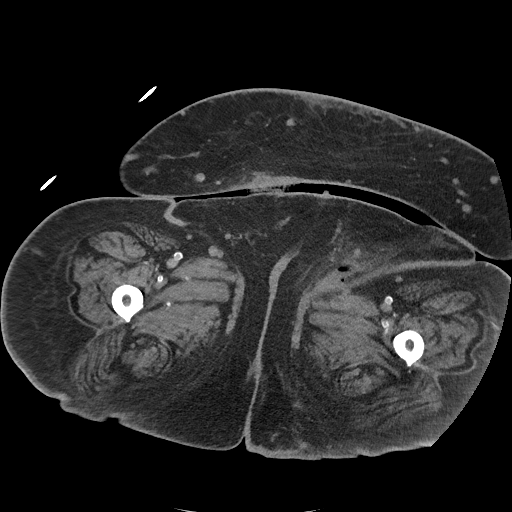
[im 6/60  bone]
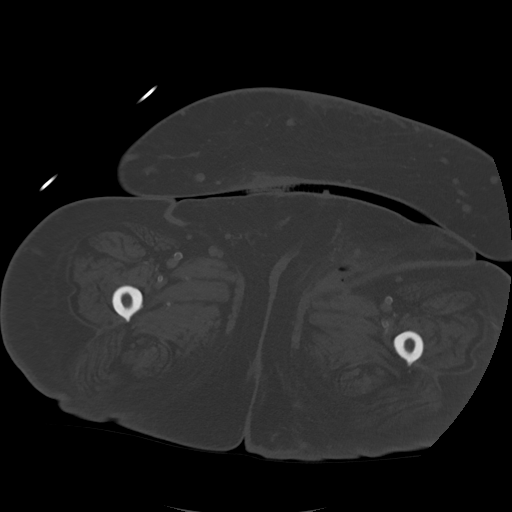
[im 12/60  soft-tissue]
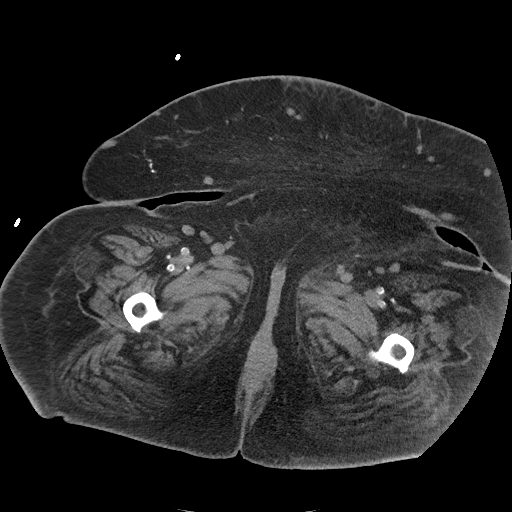
[im 18/60  soft-tissue]
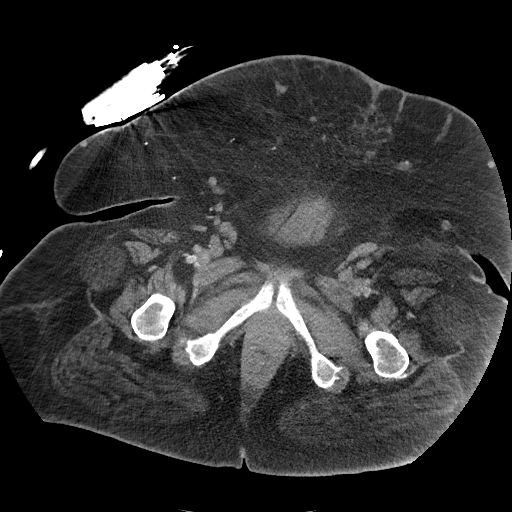
[im 24/60  soft-tissue]
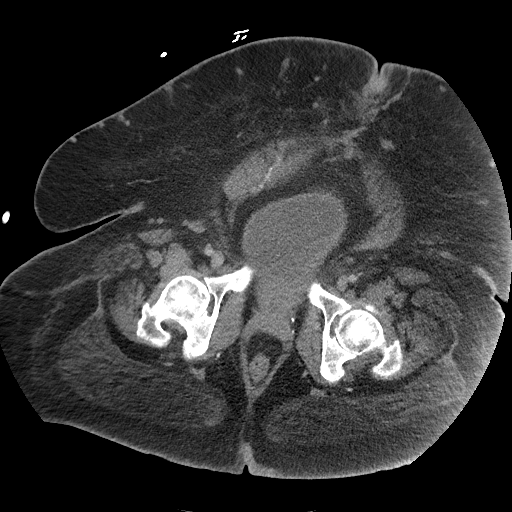
[im 30/60  soft-tissue]
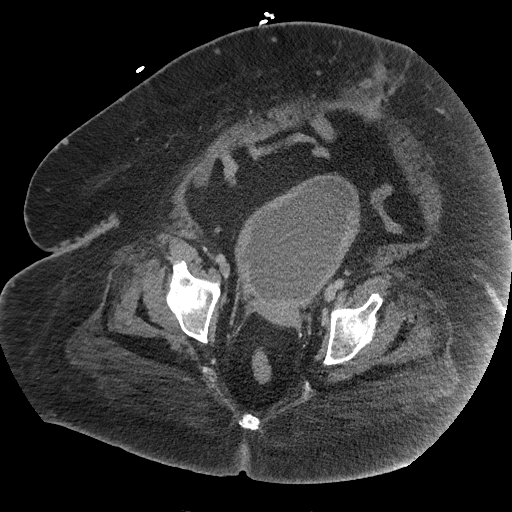
[im 36/60  soft-tissue]
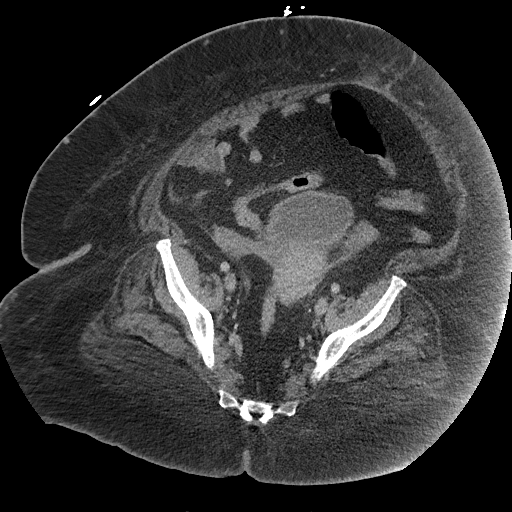
[im 42/60  soft-tissue]
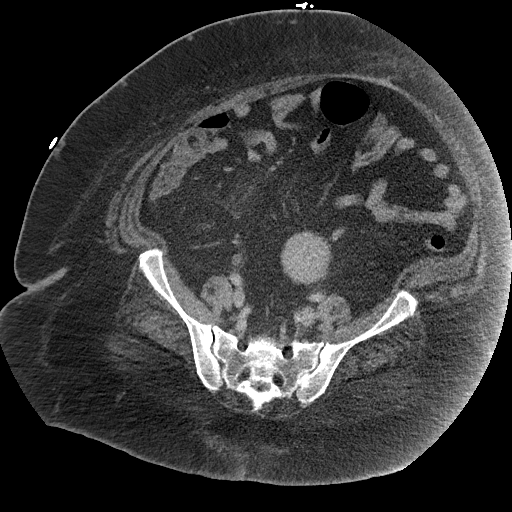
[im 48/60  soft-tissue]
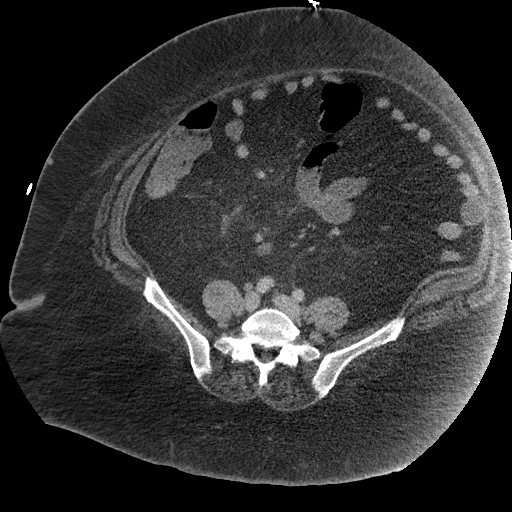
[im 48/60  lung]
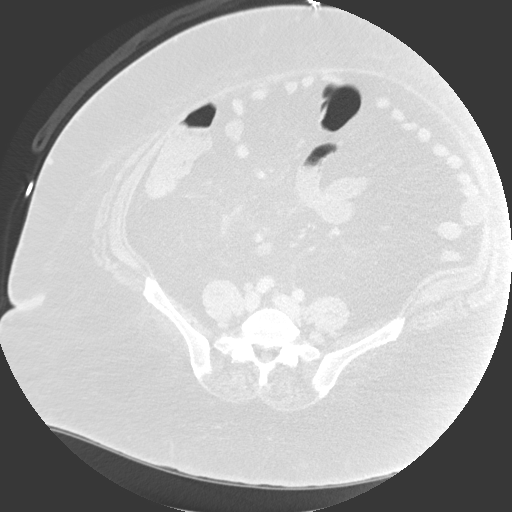
[im 51/60  lung]
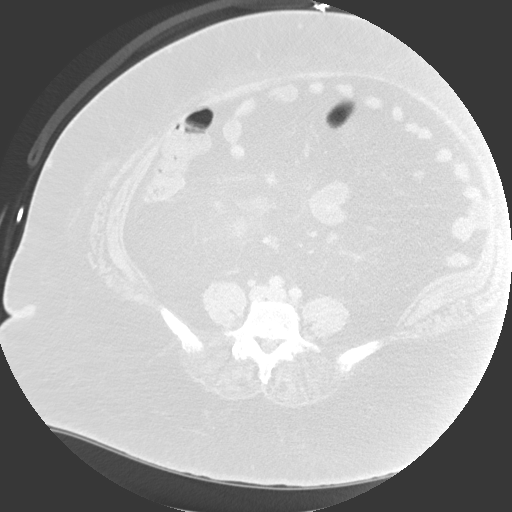
[im 54/60  soft-tissue]
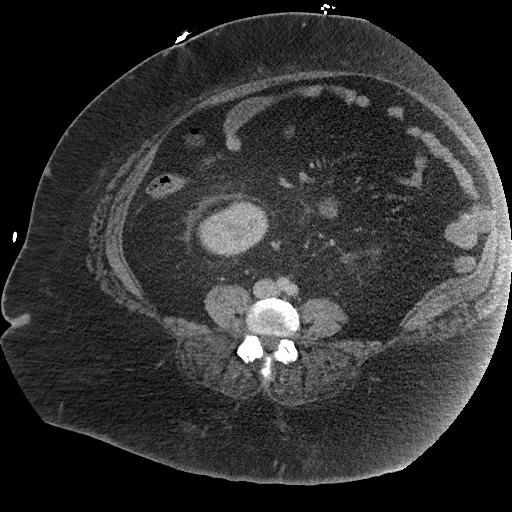
[im 54/60  lung]
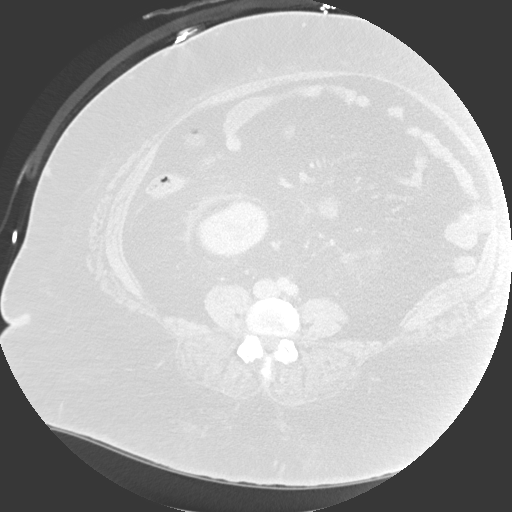
[im 54/60  bone]
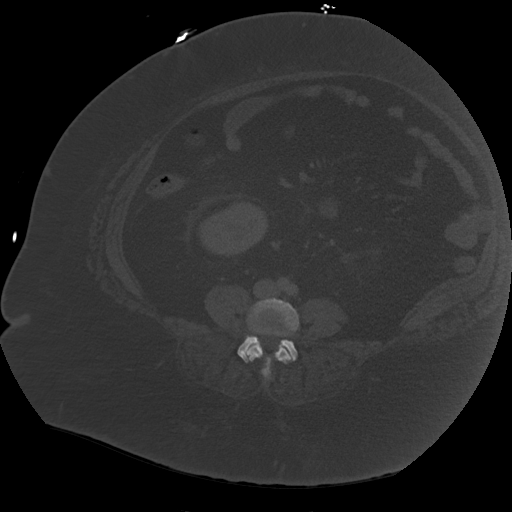
[im 57/60  lung]
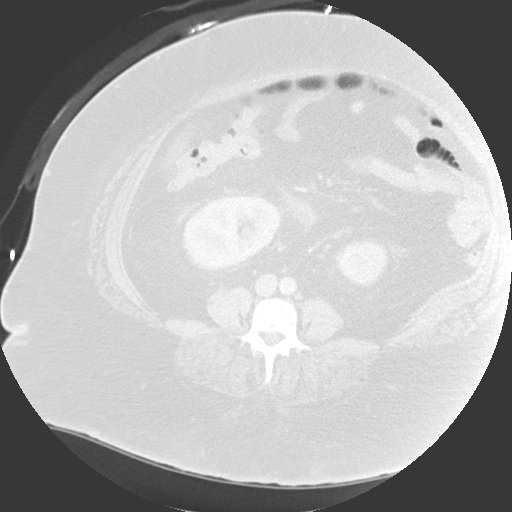

[Series 7: coronal soft tissue · coronal · 0.59mm/px · 3 of 141 slices shown]
[im 29/141  soft-tissue]
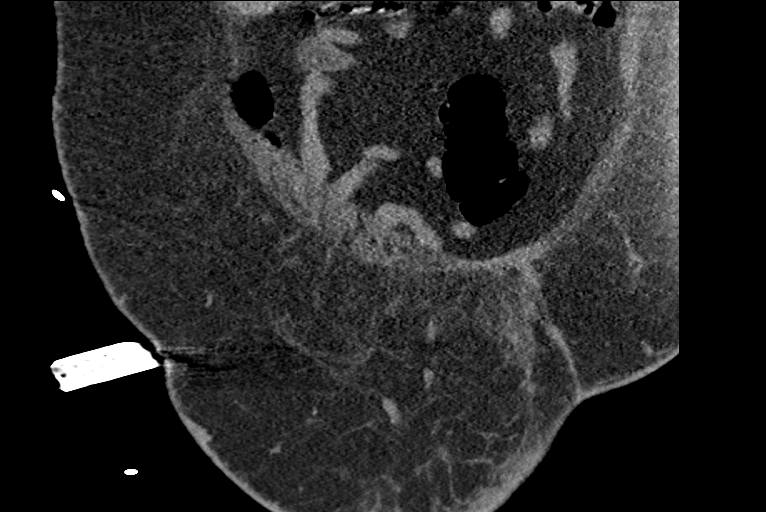
[im 57/141  soft-tissue]
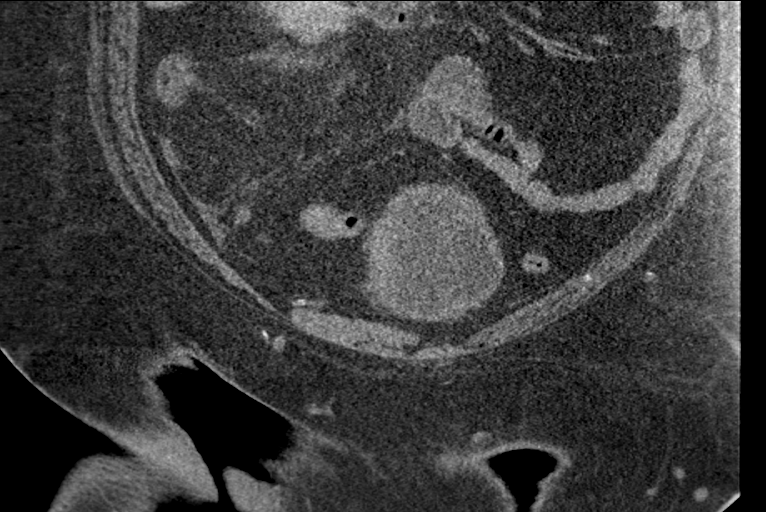
[im 85/141  soft-tissue]
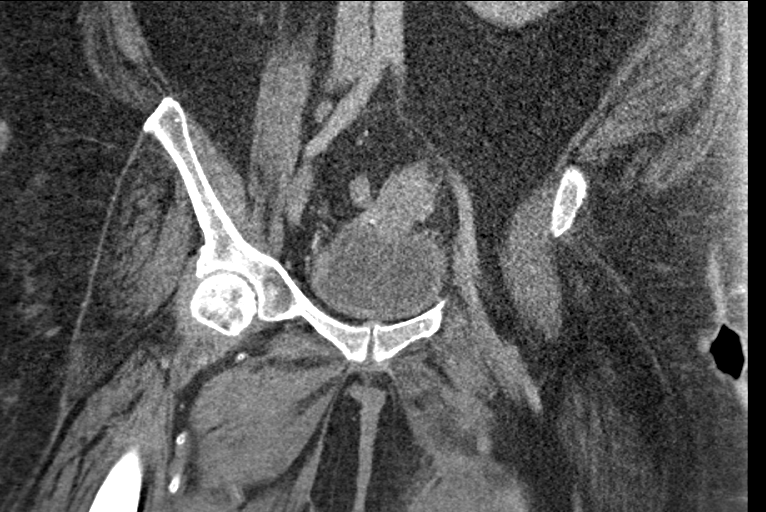

[14 of 46 positions shown; findings below may reference images not displayed]

FINDINGS: Urinary Tract:  No abnormality visualized.

Bowel:  Unremarkable visualized pelvic bowel loops.

Vascular/Lymphatic: No pathologically enlarged lymph nodes. No
significant vascular abnormality seen.

Reproductive:  No mass or other significant abnormality

Skin/soft tissues: There is a peripherally enhancing soft tissue
collection with gas in the subcutaneous soft tissues of the left
groin measuring at least 4.0 x 11.0 x 12.4 cm, consistent with
abscess.

Musculoskeletal: No suspicious bone lesions identified.
IMPRESSION: Left groin soft tissue collection with gas measuring at least 4.0 x
11.0 x 12.4 cm consistent with abscess. Surgical consultation for
further management is recommended.

## 2022-11-27 IMAGING — US US RENAL
1 series · 14 of 25 positions shown · non-contrast
Comparison: No priors.

CLINICAL DATA: 45-year-old female with history of acute kidney
injury.

EXAM:
RENAL / URINARY TRACT ULTRASOUND COMPLETE

[Series 1: us renal · 30 acquisitions, 14 frames shown]
[im 1/30]
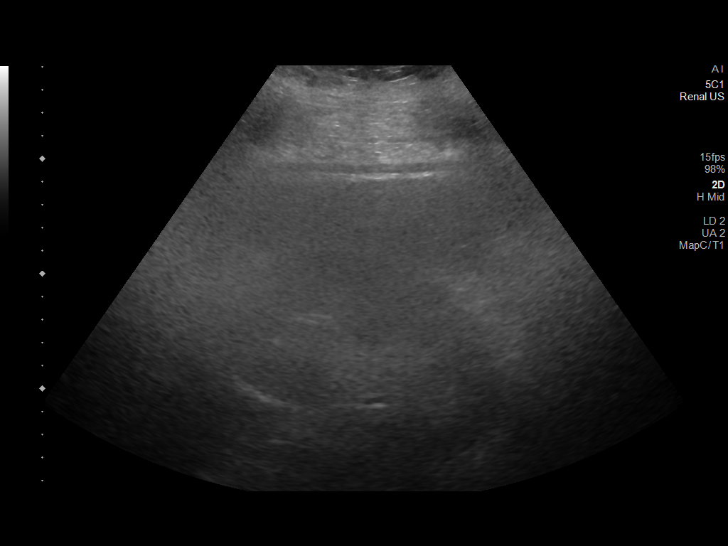
[im 3/30]
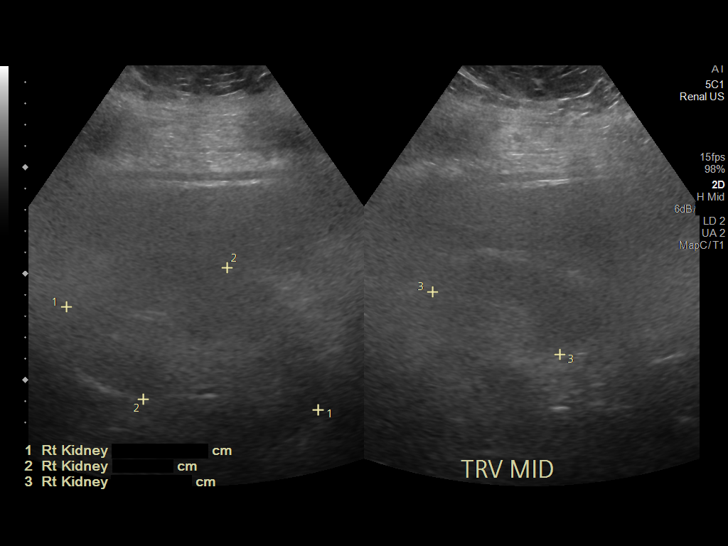
[im 5/30]
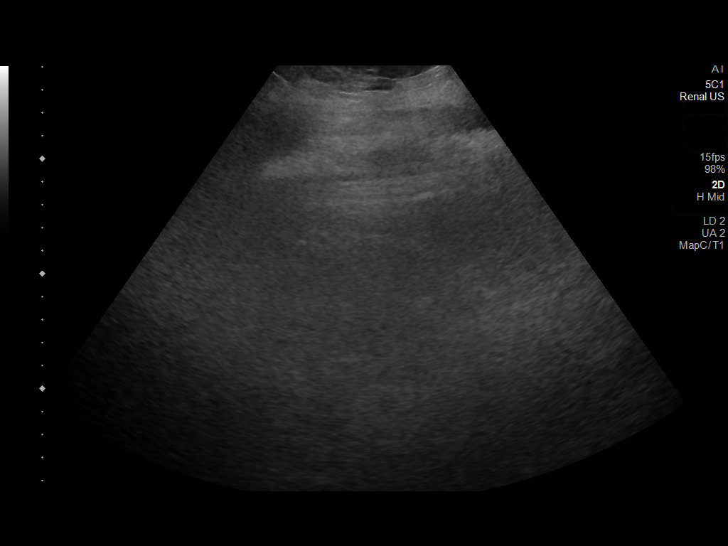
[im 8/30]
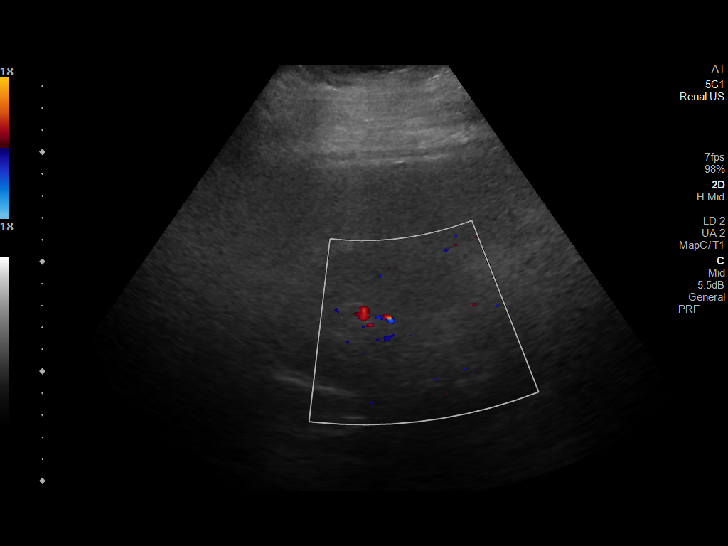
[im 10/30]
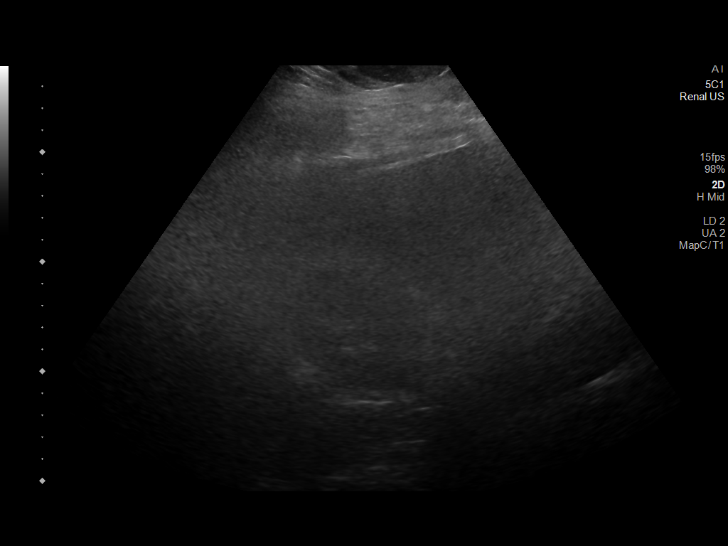
[im 11/30]
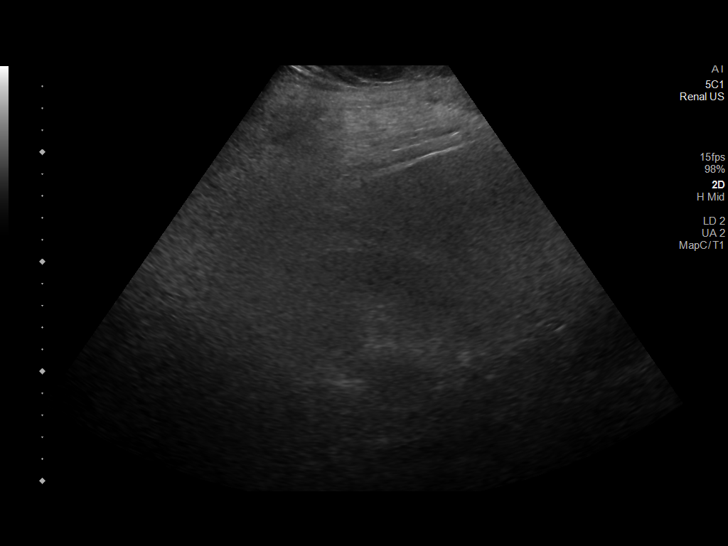
[im 14/30]
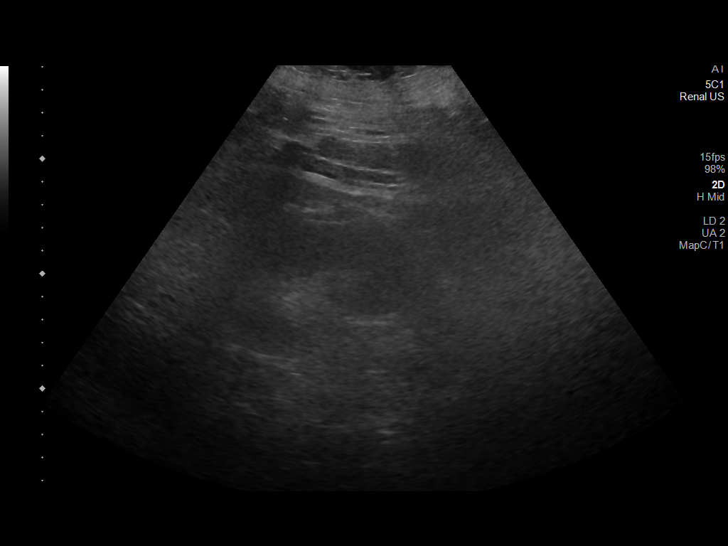
[im 16/30]
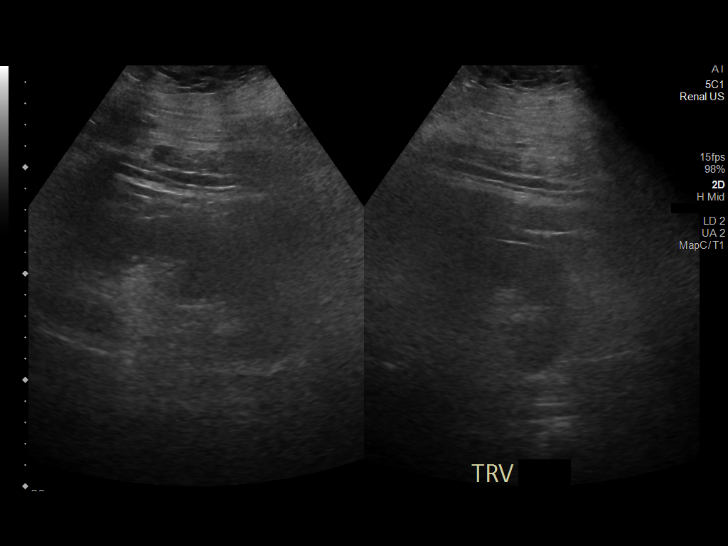
[im 19/30]
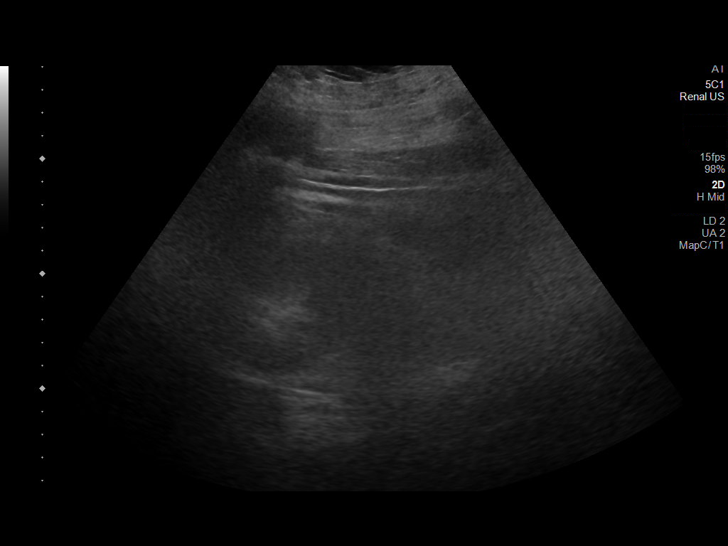
[im 20/30]
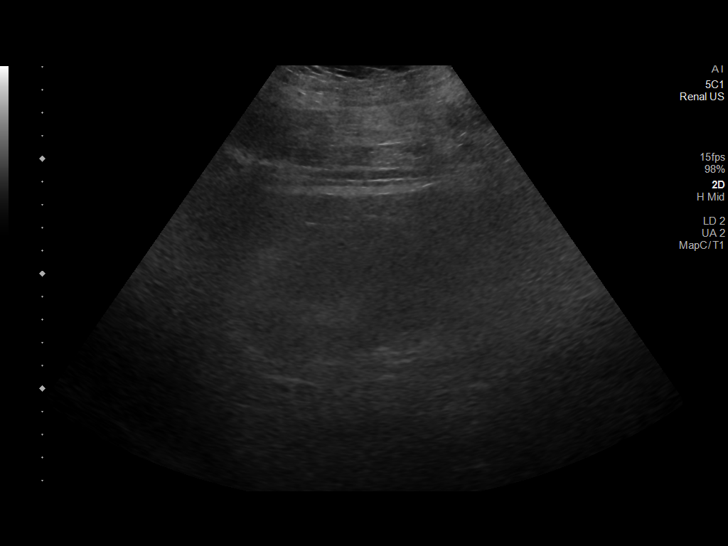
[im 22/30]
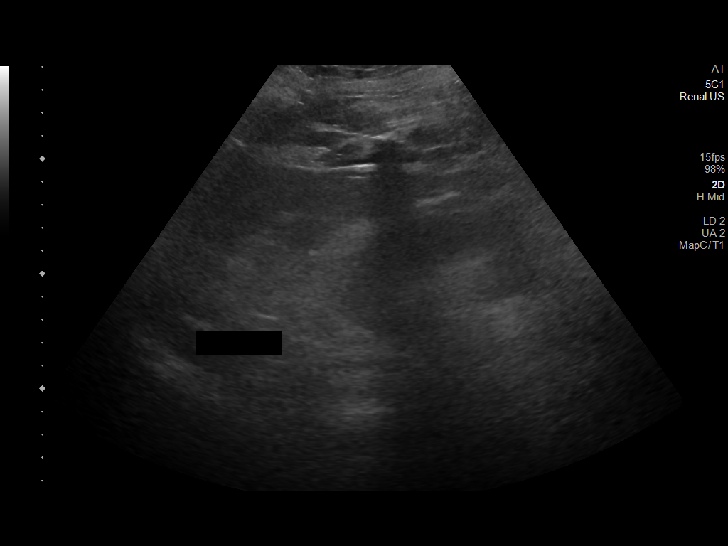
[im 25/30]
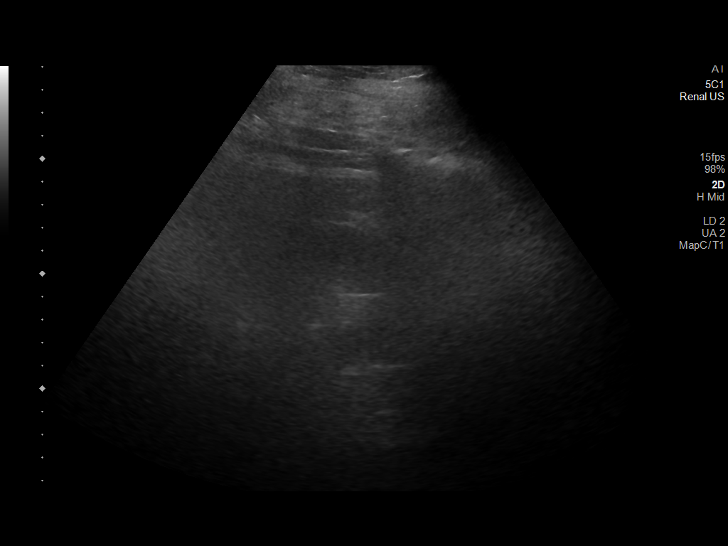
[im 27/30]
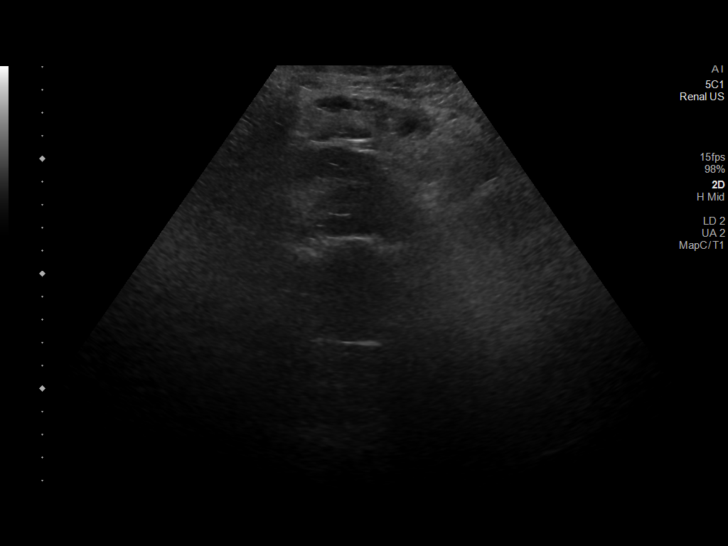
[im 30/30]
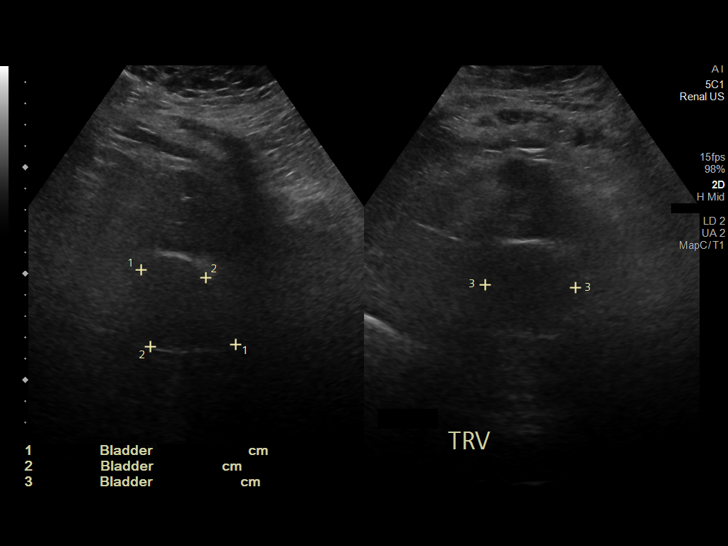

[14 of 25 positions shown; findings below may reference images not displayed]

FINDINGS: Comment: Poor quality study secondary to morbid obesity.

Right Kidney:

Renal measurements: 12.8 x 7.3 x 6.7 = volume: 327 mL. Echogenicity
within normal limits. No mass or hydronephrosis visualized.

Left Kidney:

Renal measurements: 13.1 x 6.4 x 5.5 = volume: 240 mL. Echogenicity
within normal limits. No mass or hydronephrosis visualized.

Bladder:

Appears normal for degree of bladder distention. Prevoid bladder
volume estimated at 52 mL.

Other:

None.
IMPRESSION: 1. No acute findings.  Specifically, no hydroureteronephrosis.

## 2022-11-28 ENCOUNTER — Inpatient Hospital Stay (HOSPITAL_COMMUNITY)
Admission: EM | Admit: 2022-11-28 | Discharge: 2022-11-30 | DRG: 291 | Disposition: A | Discharge status: Home / Self Care | Payer: Medicaid Other | Attending: Family Medicine | Admitting: Family Medicine

## 2022-11-28 ENCOUNTER — Emergency Department (HOSPITAL_COMMUNITY): Payer: Medicaid Other

## 2022-11-28 ENCOUNTER — Encounter (HOSPITAL_COMMUNITY): Payer: Self-pay

## 2022-11-28 ENCOUNTER — Other Ambulatory Visit: Payer: Self-pay

## 2022-11-28 DIAGNOSIS — Z79899 Other long term (current) drug therapy: Secondary | ICD-10-CM

## 2022-11-28 DIAGNOSIS — E662 Morbid (severe) obesity with alveolar hypoventilation: Secondary | ICD-10-CM | POA: Diagnosis present

## 2022-11-28 DIAGNOSIS — R739 Hyperglycemia, unspecified: Secondary | ICD-10-CM

## 2022-11-28 DIAGNOSIS — E1122 Type 2 diabetes mellitus with diabetic chronic kidney disease: Secondary | ICD-10-CM | POA: Diagnosis present

## 2022-11-28 DIAGNOSIS — I509 Heart failure, unspecified: Secondary | ICD-10-CM

## 2022-11-28 DIAGNOSIS — E1165 Type 2 diabetes mellitus with hyperglycemia: Secondary | ICD-10-CM | POA: Diagnosis present

## 2022-11-28 DIAGNOSIS — E785 Hyperlipidemia, unspecified: Secondary | ICD-10-CM | POA: Diagnosis present

## 2022-11-28 DIAGNOSIS — H409 Unspecified glaucoma: Secondary | ICD-10-CM | POA: Diagnosis present

## 2022-11-28 DIAGNOSIS — G8929 Other chronic pain: Secondary | ICD-10-CM | POA: Diagnosis present

## 2022-11-28 DIAGNOSIS — I5033 Acute on chronic diastolic (congestive) heart failure: Secondary | ICD-10-CM | POA: Diagnosis present

## 2022-11-28 DIAGNOSIS — Z1152 Encounter for screening for COVID-19: Secondary | ICD-10-CM

## 2022-11-28 DIAGNOSIS — F14188 Cocaine abuse with other cocaine-induced disorder: Secondary | ICD-10-CM | POA: Diagnosis present

## 2022-11-28 DIAGNOSIS — Z6841 Body Mass Index (BMI) 40.0 and over, adult: Secondary | ICD-10-CM

## 2022-11-28 DIAGNOSIS — I130 Hypertensive heart and chronic kidney disease with heart failure and stage 1 through stage 4 chronic kidney disease, or unspecified chronic kidney disease: Secondary | ICD-10-CM | POA: Diagnosis not present

## 2022-11-28 DIAGNOSIS — N1831 Chronic kidney disease, stage 3a: Secondary | ICD-10-CM | POA: Diagnosis present

## 2022-11-28 DIAGNOSIS — E1140 Type 2 diabetes mellitus with diabetic neuropathy, unspecified: Secondary | ICD-10-CM | POA: Diagnosis present

## 2022-11-28 DIAGNOSIS — E874 Mixed disorder of acid-base balance: Secondary | ICD-10-CM | POA: Diagnosis present

## 2022-11-28 DIAGNOSIS — R531 Weakness: Secondary | ICD-10-CM

## 2022-11-28 DIAGNOSIS — E872 Acidosis, unspecified: Secondary | ICD-10-CM | POA: Diagnosis present

## 2022-11-28 DIAGNOSIS — K219 Gastro-esophageal reflux disease without esophagitis: Secondary | ICD-10-CM | POA: Diagnosis present

## 2022-11-28 DIAGNOSIS — J449 Chronic obstructive pulmonary disease, unspecified: Secondary | ICD-10-CM | POA: Diagnosis present

## 2022-11-28 LAB — CBG MONITORING, ED
Glucose-Capillary: 487 mg/dL — ABNORMAL HIGH (ref 70–99)
Glucose-Capillary: 491 mg/dL — ABNORMAL HIGH (ref 70–99)
Glucose-Capillary: 412 mg/dL — ABNORMAL HIGH (ref 70–99)
Glucose-Capillary: 365 mg/dL — ABNORMAL HIGH (ref 70–99)

## 2022-11-28 LAB — COMPREHENSIVE METABOLIC PANEL
ALT: 12 U/L (ref 0–44)
AST: 11 U/L — ABNORMAL LOW (ref 15–41)
Albumin: 2.9 g/dL — ABNORMAL LOW (ref 3.5–5.0)
Alkaline Phosphatase: 158 U/L — ABNORMAL HIGH (ref 38–126)
Anion gap: 10 (ref 5–15)
BUN: 49 mg/dL — ABNORMAL HIGH (ref 6–20)
CO2: 33 mmol/L — ABNORMAL HIGH (ref 22–32)
Calcium: 8.5 mg/dL — ABNORMAL LOW (ref 8.9–10.3)
Chloride: 91 mmol/L — ABNORMAL LOW (ref 98–111)
Creatinine, Ser: 1.54 mg/dL — ABNORMAL HIGH (ref 0.44–1.00)
GFR, Estimated: 42 mL/min — ABNORMAL LOW (ref >60)
Glucose, Bld: 575 mg/dL (ref 70–99)
Potassium: 3.7 mmol/L (ref 3.5–5.1)
Sodium: 134 mmol/L — ABNORMAL LOW (ref 135–145)
Total Bilirubin: 0.5 mg/dL (ref 0.3–1.2)
Total Protein: 7.4 g/dL (ref 6.5–8.1)

## 2022-11-28 LAB — BLOOD GAS, VENOUS
Acid-Base Excess: 11.1 mmol/L — ABNORMAL HIGH (ref 0.0–2.0)
Bicarbonate: 40.7 mmol/L — ABNORMAL HIGH (ref 20.0–28.0)
Drawn by: 164
O2 Saturation: 83.1 %
Patient temperature: 37
pCO2, Ven: 79 mmHg (ref 44–60)
pH, Ven: 7.32 (ref 7.25–7.43)
pO2, Ven: 52 mmHg — ABNORMAL HIGH (ref 32–45)

## 2022-11-28 LAB — CBC WITH DIFFERENTIAL/PLATELET
Abs Immature Granulocytes: 0.03 10*3/uL (ref 0.00–0.07)
Basophils Absolute: 0.1 10*3/uL (ref 0.0–0.1)
Basophils Relative: 1 %
Eosinophils Absolute: 0.3 10*3/uL (ref 0.0–0.5)
Eosinophils Relative: 4 %
HCT: 35.5 % — ABNORMAL LOW (ref 36.0–46.0)
Hemoglobin: 10.7 g/dL — ABNORMAL LOW (ref 12.0–15.0)
Immature Granulocytes: 0 %
Lymphocytes Relative: 27 %
Lymphs Abs: 2.3 10*3/uL (ref 0.7–4.0)
MCH: 28 pg (ref 26.0–34.0)
MCHC: 30.1 g/dL (ref 30.0–36.0)
MCV: 92.9 fL (ref 80.0–100.0)
Monocytes Absolute: 0.7 10*3/uL (ref 0.1–1.0)
Monocytes Relative: 8 %
Neutro Abs: 5 10*3/uL (ref 1.7–7.7)
Neutrophils Relative %: 60 %
Platelets: 278 10*3/uL (ref 150–400)
RBC: 3.82 MIL/uL — ABNORMAL LOW (ref 3.87–5.11)
RDW: 15.1 % (ref 11.5–15.5)
WBC: 8.4 10*3/uL (ref 4.0–10.5)
nRBC: 0 % (ref 0.0–0.2)

## 2022-11-28 LAB — RESP PANEL BY RT-PCR (RSV, FLU A&B, COVID)  RVPGX2
Influenza A by PCR: NEGATIVE
Influenza B by PCR: NEGATIVE
Resp Syncytial Virus by PCR: NEGATIVE
SARS Coronavirus 2 by RT PCR: NEGATIVE

## 2022-11-28 LAB — I-STAT VENOUS BLOOD GAS, ED
Acid-Base Excess: 7 mmol/L — ABNORMAL HIGH (ref 0.0–2.0)
Bicarbonate: 35.7 mmol/L — ABNORMAL HIGH (ref 20.0–28.0)
Calcium, Ion: 1.13 mmol/L — ABNORMAL LOW (ref 1.15–1.40)
HCT: 35 % — ABNORMAL LOW (ref 36.0–46.0)
Hemoglobin: 11.9 g/dL — ABNORMAL LOW (ref 12.0–15.0)
O2 Saturation: 76 %
Potassium: 4.4 mmol/L (ref 3.5–5.1)
Sodium: 134 mmol/L — ABNORMAL LOW (ref 135–145)
TCO2: 38 mmol/L — ABNORMAL HIGH (ref 22–32)
pCO2, Ven: 74.5 mmHg (ref 44–60)
pH, Ven: 7.288 (ref 7.25–7.43)
pO2, Ven: 48 mmHg — ABNORMAL HIGH (ref 32–45)

## 2022-11-28 LAB — URINALYSIS, ROUTINE W REFLEX MICROSCOPIC
Bilirubin Urine: NEGATIVE
Glucose, UA: >=500 mg/dL — AB
Hgb urine dipstick: NEGATIVE
Ketones, ur: NEGATIVE mg/dL
Leukocytes,Ua: NEGATIVE
Nitrite: NEGATIVE
Protein, ur: NEGATIVE mg/dL
Specific Gravity, Urine: 1.01 (ref 1.005–1.030)
pH: 5 (ref 5.0–8.0)

## 2022-11-28 LAB — RAPID URINE DRUG SCREEN, HOSP PERFORMED
Amphetamines: NOT DETECTED
Barbiturates: NOT DETECTED
Benzodiazepines: NOT DETECTED
Cocaine: POSITIVE — AB
Opiates: NOT DETECTED
Tetrahydrocannabinol: NOT DETECTED

## 2022-11-28 LAB — GLUCOSE, CAPILLARY: Glucose-Capillary: 291 mg/dL — ABNORMAL HIGH (ref 70–99)

## 2022-11-28 LAB — TROPONIN I (HIGH SENSITIVITY)
Troponin I (High Sensitivity): 14 ng/L (ref <18)
Troponin I (High Sensitivity): 15 ng/L (ref <18)

## 2022-11-28 LAB — BRAIN NATRIURETIC PEPTIDE: B Natriuretic Peptide: 686.7 pg/mL — ABNORMAL HIGH (ref 0.0–100.0)

## 2022-11-28 LAB — HIV ANTIBODY (ROUTINE TESTING W REFLEX): HIV Screen 4th Generation wRfx: NONREACTIVE

## 2022-11-28 MED ORDER — AMLODIPINE BESYLATE 10 MG PO TABS
10.0000 mg | ORAL_TABLET | Freq: Every day | ORAL | Status: DC
  Administered 2022-11-29 – 2022-11-30 (×2): 10 mg via ORAL
  Filled 2022-11-28 (×2): qty 1

## 2022-11-28 MED ORDER — MOMETASONE FURO-FORMOTEROL FUM 200-5 MCG/ACT IN AERO
2.0000 | INHALATION_SPRAY | Freq: Two times a day (BID) | RESPIRATORY_TRACT | Status: DC
  Administered 2022-11-29 – 2022-11-30 (×3): 2 via RESPIRATORY_TRACT
  Filled 2022-11-28: qty 8.8

## 2022-11-28 MED ORDER — PANTOPRAZOLE SODIUM 40 MG PO TBEC
40.0000 mg | DELAYED_RELEASE_TABLET | Freq: Every day | ORAL | Status: DC
  Administered 2022-11-29 – 2022-11-30 (×2): 40 mg via ORAL
  Filled 2022-11-28 (×3): qty 1

## 2022-11-28 MED ORDER — ROSUVASTATIN CALCIUM 20 MG PO TABS
40.0000 mg | ORAL_TABLET | Freq: Every day | ORAL | Status: DC
  Administered 2022-11-29 – 2022-11-30 (×2): 40 mg via ORAL
  Filled 2022-11-28 (×3): qty 2

## 2022-11-28 MED ORDER — INSULIN ASPART 100 UNIT/ML IJ SOLN
0.0000 [IU] | INTRAMUSCULAR | Status: DC
  Administered 2022-11-28: 11 [IU] via SUBCUTANEOUS
  Administered 2022-11-28: 20 [IU] via SUBCUTANEOUS
  Administered 2022-11-29: 4 [IU] via SUBCUTANEOUS
  Administered 2022-11-29: 7 [IU] via SUBCUTANEOUS

## 2022-11-28 MED ORDER — ENOXAPARIN SODIUM 40 MG/0.4ML IJ SOSY
40.0000 mg | PREFILLED_SYRINGE | INTRAMUSCULAR | Status: DC
  Administered 2022-11-28: 40 mg via SUBCUTANEOUS
  Filled 2022-11-28: qty 0.4

## 2022-11-28 MED ORDER — INSULIN GLARGINE-YFGN 100 UNIT/ML ~~LOC~~ SOLN
40.0000 [IU] | Freq: Two times a day (BID) | SUBCUTANEOUS | Status: DC
  Administered 2022-11-28 – 2022-11-30 (×5): 40 [IU] via SUBCUTANEOUS
  Filled 2022-11-28 (×7): qty 0.4

## 2022-11-28 MED ORDER — INSULIN ASPART 100 UNIT/ML IJ SOLN
15.0000 [IU] | Freq: Once | INTRAMUSCULAR | Status: AC
  Administered 2022-11-28: 15 [IU] via SUBCUTANEOUS

## 2022-11-28 MED ORDER — ACETAMINOPHEN 325 MG PO TABS: 650.0000 mg | ORAL_TABLET | Freq: Four times a day (QID) | ORAL | Status: DC | PRN

## 2022-11-28 MED ORDER — FUROSEMIDE 10 MG/ML IJ SOLN
80.0000 mg | Freq: Once | INTRAMUSCULAR | Status: AC
  Administered 2022-11-28: 80 mg via INTRAVENOUS
  Filled 2022-11-28: qty 8

## 2022-11-28 MED ORDER — FUROSEMIDE 10 MG/ML IJ SOLN
80.0000 mg | Freq: Two times a day (BID) | INTRAMUSCULAR | Status: DC
  Administered 2022-11-28 – 2022-11-30 (×4): 80 mg via INTRAVENOUS
  Filled 2022-11-28 (×4): qty 8

## 2022-11-28 MED ORDER — INSULIN ASPART 100 UNIT/ML IJ SOLN: 0.0000 [IU] | Freq: Three times a day (TID) | INTRAMUSCULAR | Status: DC

## 2022-11-28 MED ORDER — INSULIN ASPART 100 UNIT/ML IJ SOLN
10.0000 [IU] | Freq: Once | INTRAMUSCULAR | Status: AC
  Administered 2022-11-28: 10 [IU] via INTRAVENOUS

## 2022-11-28 MED ORDER — ACETAMINOPHEN 650 MG RE SUPP: 650.0000 mg | Freq: Four times a day (QID) | RECTAL | Status: DC | PRN

## 2022-11-28 MED ORDER — GABAPENTIN 300 MG PO CAPS
600.0000 mg | ORAL_CAPSULE | Freq: Three times a day (TID) | ORAL | Status: DC
  Administered 2022-11-28 – 2022-11-30 (×5): 600 mg via ORAL
  Filled 2022-11-28 (×6): qty 2

## 2022-11-28 MED ORDER — DULOXETINE HCL 60 MG PO CPEP
120.0000 mg | ORAL_CAPSULE | Freq: Every day | ORAL | Status: DC
  Administered 2022-11-29 – 2022-11-30 (×2): 120 mg via ORAL
  Filled 2022-11-28: qty 4
  Filled 2022-11-28 (×3): qty 2

## 2022-11-28 MED ORDER — ENOXAPARIN SODIUM 100 MG/ML IJ SOSY
85.0000 mg | PREFILLED_SYRINGE | INTRAMUSCULAR | Status: DC
  Administered 2022-11-29: 85 mg via SUBCUTANEOUS
  Filled 2022-11-28: qty 1

## 2022-11-28 NOTE — ED Notes (Signed)
Pt's cbg [phone]. MD made aware via secure chat. Was instructed to give 20u of sliding scale insulin.

## 2022-11-28 NOTE — Progress Notes (Signed)
FMTS Interim Progress Note  S:In to check on patient with Dr. Marcina Millard.  She reports her breathing has improved.  She feels hungry.  Also having some chronic pain in her right lower extremity.  O: BP (!) 140/84 (BP Location: Right Arm)   Pulse 84   Temp 98 F (36.7 C) (Oral)   Resp 15   Ht '5\' 2"'$  (1.575 m)   Wt (!) 174 kg   SpO2 96%   BMI 70.16 kg/m   General: Alert, interactive, NAD Cardiovascular: RRR Pulmonary: Faint diffuse expiratory wheezes, breathing comfortably on BiPAP Extremities: 1+ pitting edema  A/P: Seems to be improving on BiPAP, earlier today VBG with slightly increased pCO2 so decision was made to continue her on BiPAP which she is on at night at baseline.  Hopefully can transition off BiPAP in the morning.  Otherwise continue diuresis and plan per day team. I have added acetaminophen as needed, oral or rectal formulation.  She has chronic oxycodone-acetaminophen as needed as a home medication which is currently on hold but at this time would not resume oral meds until she is off BiPAP.   Zola Button, MD 11/28/2022, 8:41 PM PGY-3, [facility] pager [phone]

## 2022-11-28 NOTE — ED Notes (Addendum)
I called Respiratory and they stated they are on their way

## 2022-11-28 NOTE — ED Provider Notes (Signed)
  Physical Exam  BP (!) 148/120   Pulse 93   Temp 97.6 F (36.4 C) (Oral)   Resp 12   SpO2 96%   Physical Exam  Procedures  Procedures  ED Course / MDM    Medical Decision Making Amount and/or Complexity of Data Reviewed Labs: ordered. Radiology: ordered.  Risk Prescription drug management.   46 yo female presents with dyspnea, uses bipap at home due to Pickwickian syndrome.  Noncompliant with diabetes meds bs 500s Now on normal oxygen 2-4 liters  Lasix and insulin being given. Likely d/c when bs down  This is a 46 year old female with hypoventilation syndrome normally on BiPAP at home.  She presents with increased dyspnea. Patient was initially seen and evaluated by Dr. Manfred Shirts  She was noted to be hyperglycemic with blood sugar of 575. IV insulin was ordered Chest x-Lucielle Vokes reviewed and noted to have increased markings consistent with volume overload She is given Lasix Blood cell count normal, afebrile, COVID, flu, and RSV negative After Lasix is given patient has had urine output VBG obtained posttreatment reveals pH 7.28 with pCO2 of 74 and pO2 of 48.  Likely some of this is chronic, but she does appear to have some respiratory acidosis. BiPAP is ordered and patient will be admitted for further evaluation and treatment  Discussed care with Dr. Jerilee Hoh, on-call for family practice who will see and evaluate for admission    Pattricia Boss, MD 11/28/22 1146

## 2022-11-28 NOTE — ED Provider Notes (Signed)
[facility] EMERGENCY DEPARTMENT Provider Note   CSN: [id number] Arrival date & time: 11/28/22  0159     History  Chief Complaint  Patient presents with   Shortness of Breath    [name] is a 46 y.o. female.  Patient presents to the emergency department for evaluation of shortness of breath.  Patient has a history of obesity hypoventilation syndrome and congestive heart failure.  She became short of breath earlier tonight.  EMS reports that she was on her BiPAP when they encountered her at home tonight, oxygen saturations were in the mid 90s but she was still reporting shortness of breath.  Patient denies cough, congestion.  She has not had any associated chest pain.       Home Medications Prior to Admission medications   Medication Sig Start Date End Date Taking? Authorizing Provider  acetaminophen (TYLENOL) 325 MG tablet Take 1-2 tablets (325-650 mg total) by mouth every 4 (four) hours as needed for mild pain. 06/04/22   Setzer, Edman Circle, PA-C  amLODipine (NORVASC) 10 MG tablet Take 1 tablet (10 mg total) by mouth daily. 09/27/22   [name], MD  aspirin EC 81 MG tablet Take 1 tablet (81 mg total) by mouth daily. Swallow whole. 11/19/20   Sherren Mocha, MD  atropine 1 % ophthalmic solution Place 1 drop into the right eye daily.    [provider]  Brinzolamide-Brimonidine [facility]) 1-0.2 % SUSP Place 1 drop into the left eye in the morning, at noon, and at bedtime.    [provider]  cephALEXin (KEFLEX) 500 MG capsule Take 1 capsule (500 mg total) by mouth 3 (three) times daily. 10/30/22   Ripley Fraise, MD  cetirizine (ZYRTEC) 10 MG tablet Take 1 tablet (10 mg total) by mouth daily as needed for allergies. 02/11/22   [name], MD  Continuous Blood Gluc Sensor (DEXCOM G6 SENSOR) MISC Inject 1 applicator into the skin as directed. Change sensor every 10 days. 11/05/21   [name], MD  Continuous Blood  Gluc Transmit (DEXCOM G6 TRANSMITTER) MISC Inject 1 Device into the skin as directed. Reuse 8 times with sensor changes. 09/03/21   [name], MD  DULoxetine (CYMBALTA) 60 MG capsule TAKE 2 CAPSULES BY MOUTH DAILY 09/15/22   Lenoria Chime, MD  gabapentin (NEURONTIN) 600 MG tablet TAKE 2 TABLETS BY MOUTH 3 TIMES DAILY 11/19/22   [name], MD  GNP ULTICARE PEN NEEDLES 32G X 4 MM MISC USE TO inject insulin 2 TIMES DAILY 10/02/21   [name], MD  insulin aspart (NOVOLOG FLEXPEN) 100 UNIT/ML FlexPen Inject 20 Units into the skin 3 (three) times daily with meals. Patient taking differently: Inject 30 Units into the skin 3 (three) times daily with meals. 05/25/22   Alcus Dad, MD  insulin degludec (TRESIBA FLEXTOUCH) 200 UNIT/ML FlexTouch Pen Inject 52 Units into the skin 2 (two) times daily. Patient taking differently: Inject 60 Units into the skin 2 (two) times daily. 06/04/22   Setzer, Edman Circle, PA-C  Insulin Disposable Pump (OMNIPOD 5 G6 POD, GEN 5,) MISC Inject into the skin daily. 07/30/22   [provider]  ipratropium-albuterol (DUONEB) 0.5-2.5 (3) MG/3ML SOLN Take 3 mLs by nebulization every 6 (six) hours as needed. 09/24/22   Spero Geralds, MD  nitroGLYCERIN (NITROSTAT) 0.4 MG SL tablet Place 1 tablet (0.4 mg total) under the tongue every 5 (five) minutes as needed for chest pain. 11/04/22   [name],  [name], MD  nystatin (MYCOSTATIN/NYSTOP) powder Apply 1 application. topically 3 (three) times daily. 03/30/22   [name], MD  ondansetron (ZOFRAN) 4 MG tablet Take 1 tablet (4 mg total) by mouth every 8 (eight) hours as needed for nausea or vomiting. 02/17/22   Lovorn, Jinny Blossom, MD  oxyCODONE-acetaminophen (PERCOCET/ROXICET) 5-325 MG tablet Take 1 tablet by mouth daily as needed for severe pain. 11/09/22   [name], MD  pantoprazole (PROTONIX) 40 MG tablet Take 1 tablet (40 mg total) by mouth daily. 09/27/22   [name],  MD  polyethylene glycol powder [facility] 17 GM/SCOOP powder Take 17 g by mouth daily as needed (constipation). 09/27/22   [name], MD  PROAIR HFA 108 [phone] Base) MCG/ACT inhaler INHALE 1 TO 2 PUFFS BY MOUTH EVERY 6 HOURS AS NEEDED FOR WHEEZING OR SHORTNESS OF BREATH 10/05/22   [name], MD  rOPINIRole (REQUIP) 1 MG tablet Take 1 tablet by mouth at bedtime.    [provider]  rosuvastatin (CRESTOR) 40 MG tablet Take 1 tablet (40 mg total) by mouth daily. 11/04/22   [name], MD  SYMBICORT 160-4.5 MCG/ACT inhaler INHALE 2 PUFFS into THE lungs 2 TIMES DAILY 09/24/22   [name], MD  terconazole (TERAZOL 7) 0.4 % vaginal cream Apply to vulva as needed for yeast 08/19/22   [name], MD  torsemide (DEMADEX) 20 MG tablet Take 4 tablets (80 mg total) by mouth 2 (two) times daily. 09/02/22   Sherren Mocha, MD  traZODone (DESYREL) 100 MG tablet TAKE 1 TABLET BY MOUTH AT BEDTIME AS NEEDED FOR SLEEP 09/15/22   Lenoria Chime, MD  Vitamin D, Cholecalciferol, 25 MCG (1000 UT) CAPS Take 1,000 Units by mouth daily. 10/05/22   [name], MD      Allergies    Cefepime, Iodine, Kiwi extract, Morphine and related, Nalbuphine, Pentoxifylline, Cephalosporins, Toradol [ketorolac tromethamine], Morphine, and Nubain [nalbuphine hcl]    Review of Systems   Review of Systems  Physical Exam Updated Vital Signs BP (!) 148/120   Pulse 93   Temp 97.6 F (36.4 C) (Oral)   Resp 12   SpO2 96%  Physical Exam Vitals and nursing note reviewed.  Constitutional:      General: She is not in acute distress.    Appearance: She is well-developed.  HENT:     Head: Normocephalic and atraumatic.     Mouth/Throat:     Mouth: Mucous membranes are moist.  Eyes:     General: Vision grossly intact. Gaze aligned appropriately.     Extraocular Movements: Extraocular movements intact.     Conjunctiva/sclera: Conjunctivae normal.  Cardiovascular:      Rate and Rhythm: Normal rate and regular rhythm.     Pulses: Normal pulses.     Heart sounds: Normal heart sounds, S1 normal and S2 normal. No murmur heard.    No friction rub. No gallop.  Pulmonary:     Effort: Pulmonary effort is normal. No respiratory distress.     Breath sounds: Normal breath sounds.  Abdominal:     General: Bowel sounds are normal.     Palpations: Abdomen is soft.     Tenderness: There is no abdominal tenderness. There is no guarding or rebound.     Hernia: No hernia is present.  Musculoskeletal:        General: No swelling.     Cervical back: Full passive range of motion without pain, normal range  of motion and neck supple. No spinous process tenderness or muscular tenderness. Normal range of motion.     Right lower leg: No edema.     Left lower leg: No edema.     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     Left Lower Extremity: Left leg is amputated below knee.  Skin:    General: Skin is warm and dry.     Capillary Refill: Capillary refill takes less than 2 seconds.     Findings: No ecchymosis, erythema, rash or wound.  Neurological:     General: No focal deficit present.     Mental Status: She is alert and oriented to person, place, and time.     GCS: GCS eye subscore is 4. GCS verbal subscore is 5. GCS motor subscore is 6.     Cranial Nerves: Cranial nerves 2-12 are intact.     Sensory: Sensation is intact.     Motor: Motor function is intact.     Coordination: Coordination is intact.  Psychiatric:        Attention and Perception: Attention normal.        Mood and Affect: Mood normal.        Speech: Speech normal.        Behavior: Behavior normal.     ED Results / Procedures / Treatments   Labs (all labs ordered are listed, but only abnormal results are displayed) Labs Reviewed  COMPREHENSIVE METABOLIC PANEL - Abnormal; Notable for the following components:      Result Value   Sodium 134 (*)    Chloride 91 (*)    CO2 33 (*)     Glucose, Bld 575 (*)    BUN 49 (*)    Creatinine, Ser 1.54 (*)    Calcium 8.5 (*)    Albumin 2.9 (*)    AST 11 (*)    Alkaline Phosphatase 158 (*)    GFR, Estimated 42 (*)    All other components within normal limits  BRAIN NATRIURETIC PEPTIDE - Abnormal; Notable for the following components:   B Natriuretic Peptide 686.7 (*)    All other components within normal limits  RAPID URINE DRUG SCREEN, HOSP PERFORMED - Abnormal; Notable for the following components:   Cocaine POSITIVE (*)    All other components within normal limits  URINALYSIS, ROUTINE W REFLEX MICROSCOPIC - Abnormal; Notable for the following components:   Color, Urine STRAW (*)    Glucose, UA >=500 (*)    Bacteria, UA MANY (*)    All other components within normal limits  CBC WITH DIFFERENTIAL/PLATELET - Abnormal; Notable for the following components:   RBC 3.82 (*)    Hemoglobin 10.7 (*)    HCT 35.5 (*)    All other components within normal limits  RESP PANEL BY RT-PCR (RSV, FLU A&B, COVID)  RVPGX2  CBC WITH DIFFERENTIAL/PLATELET  TROPONIN I (HIGH SENSITIVITY)  TROPONIN I (HIGH SENSITIVITY)    EKG None  Radiology DG Chest Port 1 View  Result Date: 11/28/2022 CLINICAL DATA:  Shortness of breath. EXAM: PORTABLE CHEST 1 VIEW COMPARISON:  09/30/2022. FINDINGS: Heart is enlarged and the mediastinal contour stable. The pulmonary vasculature is distended. Lung volumes are low. No consolidation, effusion, or pneumothorax. Evaluation of the left lung base is limited due to patient's body habitus. No acute osseous abnormality. IMPRESSION: Cardiomegaly with pulmonary vascular congestion. Electronically Signed   By: Brett Fairy M.D.   On: 11/28/2022 03:43    Procedures  Procedures    Medications Ordered in ED Medications  furosemide (LASIX) injection 80 mg (has no administration in time range)  insulin aspart (novoLOG) injection 15 Units (15 Units Subcutaneous Given 11/28/22 2505)    ED Course/ Medical Decision  Making/ A&P                           Medical Decision Making Amount and/or Complexity of Data Reviewed Labs: ordered. Radiology: ordered.  Risk Prescription drug management.   Patient presents to the emergency department with shortness of breath.  Patient reports a history of heart failure.  She reports that she takes torsemide twice a day, has not missed doses.  She feels like she might be having some volume, as her abdomen is distended.  Lower extremity exam is difficult to evaluate because of amputations.  Patient arrived on CPAP.  She did wean to nasal cannula oxygen which is her baseline.  Chest x-ray with cardiomegaly and some congestion but no overt heart failure.  BNP, however, is elevated.  Patient administered IV Lasix.  Patient hyperglycemic without acidosis.  Patient admits that she does not ever check her blood sugar.  Patient reports "I do not care what the number is".  She does not check her sugars or take her medications, correction down to normal would not likely benefit the patient.  She will be given insulin to bring her sugars down to a more normal range.  She is not requiring any further oxygen support than her normal baseline, after diuresis will be likely appropriate for discharge.        Final Clinical Impression(s) / ED Diagnoses Final diagnoses:  Hyperglycemia  Acute on chronic congestive heart failure, unspecified heart failure type [facility])    Rx / DC Orders ED Discharge Orders     None         Orpah Greek, MD 11/28/22 [id number]

## 2022-11-28 NOTE — Assessment & Plan Note (Addendum)
BNP elevated on admission.  Respiratory status improving with BiPAP, O2 sats in the upper 90s.  Transitioned to 3L [facility] this morning. -Continue to wean as tolerated, may consider sending home with higher dose of o2 -Continue Lasix IV 80mg  BID, likely transition to home torsemide as respiratory status stabilizes -PT/OT consulted -Strict I/Os -Daily weights -AM BMP

## 2022-11-28 NOTE — Assessment & Plan Note (Addendum)
BGL 400s upon admission. H/o resistant T2DM requiring high dose insulin. Takes 60U LAI BID and 64P SAI TID, uncertain medication compliance. Patient given 25U Aspart in the ED. Asymptomatic -Semglee 40U BID -CBGs + resistant SSI

## 2022-11-28 NOTE — H&P (Cosign Needed Addendum)
Hospital Admission History and Physical Service Pager: [phone]  Patient name: [name] Medical record number: [id number] Date of Birth: October 04, 1976 Age: 46 y.o. Gender: female  Primary Care Provider: [name], MD Consultants: None Code Status: Full code Preferred Emergency Contact:  Contact Information     Name Relation Home Work Mobile   Hardy Daughter   [phone]        Chief Complaint: Dyspnea  Assessment and Plan: [name] is a 46 y.o. female presenting with SOB. Differential for this patient's presentation of this includes CHF exacerbation, viral illness in the setting of obesity hypoventilation syndrome, PE, COPD exacerbation and cocaine-induced lung injury. Most likely secondary to CHF exacerbation given elevated BNP and pulmonary congestion on CXR. Less likely due to viral illness (denies cough, congestion and fever), PE (no chest pain or tachycardia) or COPD exacerbation (no cough or mucus production). Recent cocaine use indicated by positive UDS could trigger worsening heart failure and potential lung injury contributing to presentation.  * Acute exacerbation of CHF (congestive heart failure) ([name]) SOB x 1 week requiring increasing home oxygen support to 4L [place] in addition to nightly BiPap. H/o obesity hypoventilation syndrome. Presented on CPAP and was weaned to [facility] but VBG showed respiratory acidosis and ED placed patient on BiPap. Difficult to assess volume status but CXR shows some vascular congestion and elevated BNP. Given Lasix IV '80mg'$  x1, will continue diuretic. SOB mostly likely secondary to CHF exacerbation that could have been trigger by recent cocaine use (UDS cocaine+, patient denies recent use).  -Admit to [facility] progressive care, Dr. Thompson Grayer attending -Continue on continuous BiPap, wean as tolerated -Consider trending VBG to assess for respiratory acidosis improvement -Lasix IV '80mg'$  BID -PT/OT consulted -Strict I/Os -Daily  weights  Type 2 diabetes mellitus with hyperglycemia ([name]) BGL 400s upon admission. H/o resistant T2DM requiring high dose insulin. Takes 60U LAI BID and 30Q SAI TID, uncertain medication compliance. Patient given 25U Aspart in the ED. Asymptomatic -Semglee 40U BID -CBGs + resistant SSI   Chronic conditions: HLD: continue Crestor '40mg'$  COPD: Dulera (substitute for Symbicort) GERD: continue Protonix '40mg'$  Diabetic neuropathy: continue Gabapentin '600mg'$  TID and Duloxetine '120mg'$  HTN: continue home Amlodipine '10mg'$  CKD3: avoid nephrotoxic agents, strict I&Os OSA: continue BiPap nightly  FEN/GI: NPO while on BiPap VTE Prophylaxis: Lovenox  Disposition: Progressive  History of Present Illness:  [name] is a 46 y.o. female presenting with shortness of breath that started without trigger a week ago. States she started wearing 2L oxygen during that day at home and has now progressed to 4L during the day due to dyspnea. Uses BiPap at night, states she has been compliant. Associated with some swelling in her R leg. Denies fever, cough, congestion, chest pain or GI symptoms. States her cardiologist increased her torsemide recently due to excess fluid. Denies recent substance use.  Of note, patient's power wheelchair is broken and she has been using a manual wheelchair to get around for the past few months. States she does have stress at home but did not want to elaborate. Endorses compliance with her medications and took them last night. States she occasional checks her BGL and they vary from 100s-300s.  In the ED, patient arrived on CPAP but was weaned to [facility] quickly. CXR with pulmonary congestion and elevated BNP, patient given Lasix IV '80mg'$ . BGL in the 400s, given 25U. Patient VBG showed pH 7.28 and pCO2 74, she was placed back on BiPap for respiratory alkalosis.  Review Of  Systems: Per HPI   Pertinent Past Medical History: Pickwickian syndrome CHF COPD T2DM HLD HTN CAD OSA Remainder  reviewed in history tab.   Pertinent Past Surgical History: Bilateral BKA (2019, 2023) Heart cath (2013) Remainder reviewed in history tab.   Pertinent Social History: Tobacco use: Former, 3.75 pack years Alcohol use: No Other Substance use: Yes, cocaine; Denies recent use Lives with her daughter [name].  Pertinent Family History: Mother: diabetes, HLD, GERD Father: MI Grandmother, grandfather, uncle: heart disease Remainder reviewed in history tab.   Important Outpatient Medications: Amlodipine 10 mg daily Cymbalta 30 mg daily Gabapentin 600 mg BID daily Novolog 30 units TID with meals Tresiba 60 units BID Percocet 7.5 Q6 prn Symbicort 160 2 puffs daily Torsemide '100mg'$  in the morning, '80mg'$  in the evening Remainder reviewed in medication history.   Objective: BP (!) 121/55   Pulse 93   Temp 97.9 F (36.6 C) (Axillary)   Resp 18   SpO2 99%  Exam: General: Laying on side in bed with BiPap in place. Alert. NAD Eyes: Conjunctival injection bilaterally ENTM: Moist mucus membrane Neck: Supple Cardiovascular: RRR Respiratory: Limited by body habitus. Normal WOB on BiPAP Gastrointestinal: Soft, non-tender, obese abdomen MSK: Difficult to assess peripheral edema due to body habitus, s/p L BKA Derm: Warm, dry Neuro: A&O x3, Motor and sensation intact globally Psych: Cooperative  Labs:  CBC BMET  Recent Labs  Lab 11/28/22 0530 11/28/22 1110  WBC 8.4  --   HGB 10.7* 11.9*  HCT 35.5* 35.0*  PLT 278  --    Recent Labs  Lab 11/28/22 0253 11/28/22 1110  NA 134* 134*  K 3.7 4.4  CL 91*  --   CO2 33*  --   BUN 49*  --   CREATININE 1.54*  --   GLUCOSE 575*  --   CALCIUM 8.5*  --     Pertinent additional labs: VBG: 7.288/74.5/48/38/7.0/35.7 BNP: 687 Glu: 487>491 Ca: 1.13 Troponin: 14>15 Hgb: 10.7>11.9 Quad screen: neg UA: Glu >500, many bacteria UDS: cocaine+  EKG: My own interpretation (not copied from electronic read) NSR   Imaging Studies  Performed: DG Chest Port 1 View Result Date: 11/28/2022 IMPRESSION: Cardiomegaly with pulmonary vascular congestion.    Colletta Maryland, MD 11/28/2022, 1:59 PM PGY-1, [facility] Intern pager: [phone], text pages welcome Secure chat group [place] Upper-Level Resident Addendum   I have independently interviewed and examined the patient. I have discussed the above with Dr. Jerilee Hoh and agree with the documented plan. My edits for correction/addition/clarification are included above. Please see any attending notes.   Alcus Dad, MD PGY-3, [facility] 11/28/2022 2:55 PM  FPTS Service pager: [phone] (text pages welcome through [place])

## 2022-11-28 NOTE — Evaluation (Signed)
Physical Therapy Evaluation & Discharge Patient Details Name: [name] MRN: [id number] DOB: 1976-03-20 Today's Date: 11/28/2022  History of Present Illness  Pt is a 46 y.o. female who presented 11/27/22 with SOB. Chest x-ray reviewed and noted to have increased markings consistent with volume overload. Work-up for CHF exacerbation vs viral illness in setting of obesity hypoventilation syndrome vs cocaine-induced. PMH: type 2 diabetes, morbid obesity, obesity hypoventilation syndrome, OSA wears BiPAP at night, hypertension, asthma, GERD, hyperlipidemia, restless leg syndrome, left BKA, R transmetatarsal amputation, mood disorder, hypertension, history of chest pain   Clinical Impression  Pt presents with condition above and deficits mentioned below, see PT Problem List. PTA, she was requiring some assistance from her daughters or aide for all functional mobility and ADLs. She was able to ambulate up to ~5 ft at a time with a RW before needing to sit and rest. She primarily uses a w/c for mobility. She lives with her daughters in a motel with 1 STE. Currently, pt reports and appears to be functioning at her baseline, needing up to minA for bed mobility with HOB elevated. She sleeps in her lift chair. Eval limited to EOB activity due to pt sitting on a stretcher that is taller than hospital beds and she has a short stature and was unable to reach her R foot to the ground to try to stand. In addition, her L prosthesis was not present. She is does demonstrate deficits in strength and aerobic endurance, but was able to maintain SpO2 >/= 90% on 3L O2 throughout the session but had DOE 2/4. Pt usually is on 2L O2 and intermittently bumps it up to 3L as needed at baseline. As pt is at her functional baseline, no acute PT needs identified. However, will recommend OPPT to address her deficits and try to improve her strength, endurance, and independence.     Recommendations for follow up therapy are one component  of a multi-disciplinary discharge planning process, led by the attending physician.  Recommendations may be updated based on patient status, additional functional criteria and insurance authorization.  Follow Up Recommendations Outpatient PT      Assistance Recommended at Discharge Intermittent Supervision/Assistance  Patient can return home with the following  A little help with walking and/or transfers;A little help with bathing/dressing/bathroom;Assistance with cooking/housework;Assist for transportation;Help with stairs or ramp for entrance    Equipment Recommendations None recommended by PT  Recommendations for Other Services       Functional Status Assessment Patient has not had a recent decline in their functional status     Precautions / Restrictions Precautions Precautions: Fall;Other (comment) Precaution Comments: watch SpO2 (2-3L baseline); hx of L BKA (prosthesis at home) and R transmet amputation Restrictions Weight Bearing Restrictions: No      Mobility  Bed Mobility Overal bed mobility: Needs Assistance Bed Mobility: Supine to Sit, Sit to Supine     Supine to sit: Min assist, +2 for safety/equipment, HOB elevated Sit to supine: Min guard, HOB elevated, +2 for safety/equipment   General bed mobility comments: Pt with increased body habitus on stretcher, thus +2 provided for safety as pt is also of short stature and could not reach R foot to ground sitting on edge. MinA with pt requesting hand to pull up to sit. Min guard to return to supine.    Transfers                   General transfer comment: deferred due to short stature with pt  on stretcher and L prosthesis not present    Ambulation/Gait               General Gait Details: deferred due to short stature with pt on stretcher and L prosthesis not present  Stairs            Wheelchair Mobility    Modified Rankin (Stroke Patients Only)       Balance Overall balance  assessment: Needs assistance Sitting-balance support: No upper extremity supported, Feet unsupported Sitting balance-Leahy Scale: Good Sitting balance - Comments: Able to reach mod off BOS without UE support sitting edge of stretcher without feet support. Min guard for safety       Standing balance comment: deferred due to short stature with pt on stretcher and L prosthesis not present                             Pertinent Vitals/Pain Pain Assessment Pain Assessment: Faces Faces Pain Scale: No hurt Pain Intervention(s): Monitored during session    [place] expects to be discharged to:: Private residence Living Arrangements: Children (daughter) Available Help at Discharge: Family;Personal care attendant;Available 24 hours/day;Friend(s) (aide x7 days per week, 8 hours M-F, 6 hours S & S) Type of Home: Other(Comment) (motel) Home Access: Stairs to enter Entrance Stairs-Rails: None Entrance Stairs-Number of Steps: 1   Home Layout: One level Home Equipment: Conservation officer, nature (2 wheels);Tub bench;Wheelchair - manual;Wheelchair - power;BSC/3in1;Other (comment) (drop arm BSC; lift chair) Additional Comments: has compressor and portable tank, trying to get portable concentrator; on 2-3L O2 lately (increased to 4L when had SOB yesterday); BiPAP at home; sleeps in her lift chair    Prior Function Prior Level of Function : Needs assist             Mobility Comments: Has been walking up to ~5 ft distances with a RW and assistance. Power w/c tire was damaged so she has been using manual w/c recently with plans to get power w/c fixed. Pt reports she has assistance for all functional mobility. Daughters help her step up the x1 STE or bump her up it in the w/c. ADLs Comments: Medicare transportation; she washes her top half in bed and aide washes bottom half; asisstance for prosthesis donning/doffing     Hand Dominance        Extremity/Trunk Assessment   Upper  Extremity Assessment Upper Extremity Assessment: Defer to OT evaluation    Lower Extremity Assessment Lower Extremity Assessment: RLE deficits/detail;LLE deficits/detail RLE Deficits / Details: hx of R transmet amputation; gross weakness noted but able to lift against gravity LLE Deficits / Details: hx of L BKA; gross weakness noted but able to lift against gravity    Cervical / Trunk Assessment Cervical / Trunk Assessment: Other exceptions Cervical / Trunk Exceptions: increased body habitus  Communication   Communication: No difficulties  Cognition Arousal/Alertness: Awake/alert Behavior During Therapy: WFL for tasks assessed/performed Overall Cognitive Status: Within Functional Limits for tasks assessed                                          General Comments General comments (skin integrity, edema, etc.): SpO2 >/= 90% on 3L O2 in bed, >/= 95% on 3L sitting EOB, DOE 2/4 with bed mobility    Exercises     Assessment/Plan    PT  Assessment All further PT needs can be met in the next venue of care  PT Problem List Decreased strength;Decreased activity tolerance;Cardiopulmonary status limiting activity;Obesity       PT Treatment Interventions      PT Goals (Current goals can be found in the Care Plan section)  Acute Rehab PT Goals Patient Stated Goal: to get gastric bypass surgery PT Goal Formulation: All assessment and education complete, DC therapy Time For Goal Achievement: 11/29/22 Potential to Achieve Goals: Fair    Frequency       Co-evaluation PT/OT/SLP Co-Evaluation/Treatment: Yes Reason for Co-Treatment: For patient/therapist safety;To address functional/ADL transfers PT goals addressed during session: Mobility/safety with mobility;Balance         AM-PAC PT "6 Clicks" Mobility  Outcome Measure Help needed turning from your back to your side while in a flat bed without using bedrails?: A Little Help needed moving from lying on your back  to sitting on the side of a flat bed without using bedrails?: A Little Help needed moving to and from a bed to a chair (including a wheelchair)?: A Little (assumed based on pt report of her baseline needs and reporting being  at her baseline currently) Help needed standing up from a chair using your arms (e.g., wheelchair or bedside chair)?: A Little (assumed based on pt report of her baseline needs and reporting being at her baseline currently) Help needed to walk in hospital room?: Total Help needed climbing 3-5 steps with a railing? : Total 6 Click Score: 14    End of Session Equipment Utilized During Treatment: Oxygen Activity Tolerance: Patient tolerated treatment well Patient left: in bed;with call bell/phone within reach;with family/visitor present Nurse Communication: Mobility status;Other (comment) (sats) PT Visit Diagnosis: Muscle weakness (generalized) (M62.81);Difficulty in walking, not elsewhere classified (R26.2)    Time: 6712-4580 PT Time Calculation (min) (ACUTE ONLY): 25 min   Charges:   PT Evaluation $PT Eval Moderate Complexity: 1 Mod          Moishe Spice, PT, DPT Acute Rehabilitation Services  Office: [phone]   Orvan Falconer 11/28/2022, 1:49 PM

## 2022-11-28 NOTE — Evaluation (Signed)
Occupational Therapy Evaluation Patient Details Name: [name] MRN: [id number] DOB: [birth year] Today's Date: 11/28/2022   History of Present Illness 46 y.o. female who presented 11/27/22 with SOB. Chest x-ray reviewed and noted to have increased markings consistent with volume overload. Work-up for CHF exacerbation vs viral illness in setting of obesity hypoventilation syndrome vs cocaine-induced. PMH: type 2 diabetes, morbid obesity, obesity hypoventilation syndrome, OSA wears BiPAP at night, hypertension, asthma, GERD, hyperlipidemia, restless leg syndrome, left BKA, R transmetatarsal amputation, mood disorder, hypertension, history of chest pain   Clinical Impression   Pt lives in motel with one daughter and second daughter lives in the room next door. The daughter next door provides all food and has a 44 yo. Pt reports that her electric chair has a broken wheel that per adapt can not be repaired until March 2024. Pt demonstrates changes in her oxygen saturation dependent on her body position due to body habitus. Pt reports close to baseline for mobility but increased SOB. Pt noted to have changes with bed mobility but due to lack of prosthetic difficult to full determine mobility with oxygen saturation. Recommendation for follow up outpatient.   Pt expressed pending gastric bypass consult. Pt reports she is unable to eat well due to being in motel and only having a mini refrigerator in the room. Pt's daughter has a stove in her room so pt is dependent on her for meals.       Recommendations for follow up therapy are one component of a multi-disciplinary discharge planning process, led by the attending physician.  Recommendations may be updated based on patient status, additional functional criteria and insurance authorization.   Follow Up Recommendations  Outpatient OT     Assistance Recommended at Discharge Set up Supervision/Assistance  Patient can return home with the following       Functional Status Assessment  Patient has had a recent decline in their functional status and demonstrates the ability to make significant improvements in function in a reasonable and predictable amount of time.  Equipment Recommendations  None recommended by OT    Recommendations for Other Services       Precautions / Restrictions Precautions Precautions: Fall;Other (comment) Precaution Comments: watch SpO2 (2-3L baseline); hx of L BKA (prosthesis at home) and R transmet amputation Restrictions Weight Bearing Restrictions: No      Mobility Bed Mobility Overal bed mobility: Needs Assistance Bed Mobility: Supine to Sit, Sit to Supine     Supine to sit: Min assist, +2 for safety/equipment, HOB elevated Sit to supine: Min guard, HOB elevated, +2 for safety/equipment   General bed mobility comments: Pt with increased body habitus on stretcher, thus +2 provided for safety as pt is also of short stature and could not reach R foot to ground sitting on edge. MinA with pt requesting hand to pull up to sit. Min guard to return to supine.    Transfers                   General transfer comment: deferred due to short stature with pt on stretcher and L prosthesis not present      Balance Overall balance assessment: Needs assistance Sitting-balance support: No upper extremity supported, Feet unsupported Sitting balance-Leahy Scale: Good Sitting balance - Comments: Able to reach mod off BOS without UE support sitting edge of stretcher without feet support. Min guard for safety       Standing balance comment: deferred due to short stature with pt on stretcher and  L prosthesis not present                           ADL either performed or assessed with clinical judgement   ADL Overall ADL's : Needs assistance/impaired Eating/Feeding: Independent   Grooming: Modified independent   Upper Body Bathing: Modified independent   Lower Body Bathing: Maximal  assistance Lower Body Bathing Details (indicate cue type and reason): baseline requires help from daughter                             Vision Baseline Vision/History: 3 Glaucoma Ability to See in Adequate Light: 2 Moderately impaired       Perception     Praxis      Pertinent Vitals/Pain Pain Assessment Faces Pain Scale: No hurt     Hand Dominance Right   Extremity/Trunk Assessment Upper Extremity Assessment Upper Extremity Assessment: Overall WFL for tasks assessed   Lower Extremity Assessment Lower Extremity Assessment: Defer to PT evaluation RLE Deficits / Details: hx of R transmet amputation; gross weakness noted but able to lift against gravity LLE Deficits / Details: hx of L BKA; gross weakness noted but able to lift against gravity   Cervical / Trunk Assessment Cervical / Trunk Assessment: Other exceptions Cervical / Trunk Exceptions: increased body habitus   Communication Communication Communication: No difficulties   Cognition Arousal/Alertness: Awake/alert Behavior During Therapy: WFL for tasks assessed/performed Overall Cognitive Status: Within Functional Limits for tasks assessed                                       General Comments  3L [place] 90% in bed with HOB full up and 95% at EOB with body habitus anterior to bed surface. Pt demonstrates DOE 2 out 4    Exercises     Shoulder Instructions      Home Living Family/patient expects to be discharged to:: Private residence Living Arrangements: Children (daughter) Available Help at Discharge: Family;Personal care attendant;Available 24 hours/day;Friend(s) (aide x7 days per week, 8 hours M-F, 6 hours S & S) Type of Home: Other(Comment) (motel) Home Access: Stairs to enter [employer] of Steps: 1 Entrance Stairs-Rails: None Home Layout: One level     Bathroom Shower/Tub: Sponge bathes at baseline   [employer]:  (uses bedside commode)     Home Equipment:  Conservation officer, nature (2 wheels);Tub bench;Wheelchair - manual;Wheelchair - power;BSC/3in1;Other (comment) (drop arm BSC; lift chair)   Additional Comments: has compressor and portable tank, trying to get portable concentrator; on 2-3L O2 lately (increased to 4L when had SOB yesterday); BiPAP at home; sleeps in her lift chair      Prior Functioning/Environment Prior Level of Function : Needs assist             Mobility Comments: Has been walking up to ~5 ft distances with a RW and assistance. Power w/c tire was damaged so she has been using manual w/c recently with plans to get power w/c fixed. Pt reports she has assistance for all functional mobility. Daughters help her step up the x1 STE or bump her up it in the w/c. ADLs Comments: Medicare transportation; she washes her top half in bed and aide washes bottom half; asisstance for prosthesis donning/doffing        OT Problem List:  OT Treatment/Interventions:      OT Goals(Current goals can be found in the care plan section) Acute Rehab OT Goals Potential to Achieve Goals: Good  OT Frequency:      Co-evaluation PT/OT/SLP Co-Evaluation/Treatment: Yes Reason for Co-Treatment: For patient/therapist safety PT goals addressed during session: Mobility/safety with mobility;Balance OT goals addressed during session: ADL's and self-care      AM-PAC OT "6 Clicks" Daily Activity     Outcome Measure Help from another person eating meals?: None Help from another person taking care of personal grooming?: None Help from another person toileting, which includes using toliet, bedpan, or urinal?: A Little Help from another person bathing (including washing, rinsing, drying)?: A Little Help from another person to put on and taking off regular upper body clothing?: None Help from another person to put on and taking off regular lower body clothing?: A Lot 6 Click Score: 20   End of Session Equipment Utilized During Treatment: Oxygen Nurse  Communication: Mobility status;Precautions  Activity Tolerance: Patient tolerated treatment well Patient left: in bed;with call bell/phone within reach;with family/visitor present (no bed alarm ED)                   Time: 5035-4656 OT Time Calculation (min): 22 min Charges:  OT General Charges $OT Visit: 1 Visit OT Evaluation $OT Eval Moderate Complexity: 1 Mod   [name], OTR/L  Acute Rehabilitation Services Office: [phone] .   Jeri Modena 11/28/2022, 2:36 PM

## 2022-11-28 NOTE — Hospital Course (Addendum)
[name] is a 46 y.o. female who presented with shortness of breath. PMH significant for HFpEF, OHS, OSA, T2DM, HTN, HLD, COPD, GERD, and CKD3.  Acute CHF Exacerbation OHS OSA Patient presented with several days of worsening dyspnea. Clinical picture consistent with CHF exacerbation given CXR with pulmonary vascular congestion and elevated BNP to 686. Etiology of exacerbation secondary to cocaine use vs suboptimal medication/BiPAP adherence. Was hypercarbic on admission, but improved with IV diuresis and BiPAP. Patient discharged on home Torsemide '80mg'$  BID. Encouraged continued CPAP use at night as well as prn supplemental O2 during the day.  Prior echos have not been helpful in evaluating heart function/structure due to limited views (BMI 70).  Seen by PT/OT who recommended outpatient f/u.  T2DM While on BiPAP, pt remained NPO and on 40U Semglee BID with QID SSI. Blood glucose ranged in the 200s. Upon discharge, was placed back on home regimen of 60U BID LAI + 30U TID mealtime. A1c was 11.  PCP follow up recommendations Ensure adequate diuresis and DM control UDS negative for opiates, unlikely she is taking what was prescribed. Consider discontinuing as there was also cocaine in UDS. 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 Pt with difficult social/home situation - living in a hotel with her daughter, recently broke her electric wheelchair. Stressors are also contributing to substance use

## 2022-11-28 NOTE — ED Triage Notes (Signed)
PT from home, called EMS with SOB for several days. BGL is High. Pt reports she hasn't checked it in a long time. Pt arrived on CPAP

## 2022-11-28 NOTE — Significant Event (Signed)
Date and time results received: 11/28/22  (use smartphrase ".now" to insert current time)  Test: Venous blood gas Critical Value: PC02 79  Name of Provider Notified: FM Team  Orders Received? Or Actions Taken?: pt currently on Bipap continue on Bipap

## 2022-11-29 DIAGNOSIS — F14188 Cocaine abuse with other cocaine-induced disorder: Secondary | ICD-10-CM | POA: Diagnosis present

## 2022-11-29 DIAGNOSIS — H409 Unspecified glaucoma: Secondary | ICD-10-CM | POA: Diagnosis present

## 2022-11-29 DIAGNOSIS — I509 Heart failure, unspecified: Secondary | ICD-10-CM

## 2022-11-29 DIAGNOSIS — E874 Mixed disorder of acid-base balance: Secondary | ICD-10-CM | POA: Diagnosis present

## 2022-11-29 DIAGNOSIS — E1122 Type 2 diabetes mellitus with diabetic chronic kidney disease: Secondary | ICD-10-CM | POA: Diagnosis present

## 2022-11-29 DIAGNOSIS — E1165 Type 2 diabetes mellitus with hyperglycemia: Secondary | ICD-10-CM | POA: Diagnosis present

## 2022-11-29 DIAGNOSIS — E785 Hyperlipidemia, unspecified: Secondary | ICD-10-CM | POA: Diagnosis present

## 2022-11-29 DIAGNOSIS — K219 Gastro-esophageal reflux disease without esophagitis: Secondary | ICD-10-CM | POA: Diagnosis present

## 2022-11-29 DIAGNOSIS — N1831 Chronic kidney disease, stage 3a: Secondary | ICD-10-CM | POA: Diagnosis present

## 2022-11-29 DIAGNOSIS — E1140 Type 2 diabetes mellitus with diabetic neuropathy, unspecified: Secondary | ICD-10-CM | POA: Diagnosis present

## 2022-11-29 DIAGNOSIS — Z1152 Encounter for screening for COVID-19: Secondary | ICD-10-CM | POA: Diagnosis not present

## 2022-11-29 DIAGNOSIS — G8929 Other chronic pain: Secondary | ICD-10-CM | POA: Diagnosis present

## 2022-11-29 DIAGNOSIS — E662 Morbid (severe) obesity with alveolar hypoventilation: Secondary | ICD-10-CM | POA: Diagnosis present

## 2022-11-29 DIAGNOSIS — I130 Hypertensive heart and chronic kidney disease with heart failure and stage 1 through stage 4 chronic kidney disease, or unspecified chronic kidney disease: Secondary | ICD-10-CM | POA: Diagnosis not present

## 2022-11-29 DIAGNOSIS — E872 Acidosis, unspecified: Secondary | ICD-10-CM | POA: Diagnosis present

## 2022-11-29 DIAGNOSIS — I5033 Acute on chronic diastolic (congestive) heart failure: Secondary | ICD-10-CM | POA: Diagnosis present

## 2022-11-29 DIAGNOSIS — Z6841 Body Mass Index (BMI) 40.0 and over, adult: Secondary | ICD-10-CM | POA: Diagnosis not present

## 2022-11-29 DIAGNOSIS — J449 Chronic obstructive pulmonary disease, unspecified: Secondary | ICD-10-CM | POA: Diagnosis present

## 2022-11-29 DIAGNOSIS — Z79899 Other long term (current) drug therapy: Secondary | ICD-10-CM | POA: Diagnosis not present

## 2022-11-29 LAB — BASIC METABOLIC PANEL
Anion gap: 7 (ref 5–15)
BUN: 57 mg/dL — ABNORMAL HIGH (ref 6–20)
CO2: 34 mmol/L — ABNORMAL HIGH (ref 22–32)
Calcium: 8.7 mg/dL — ABNORMAL LOW (ref 8.9–10.3)
Chloride: 96 mmol/L — ABNORMAL LOW (ref 98–111)
Creatinine, Ser: 1.45 mg/dL — ABNORMAL HIGH (ref 0.44–1.00)
GFR, Estimated: 46 mL/min — ABNORMAL LOW (ref >60)
Glucose, Bld: 212 mg/dL — ABNORMAL HIGH (ref 70–99)
Potassium: 3.9 mmol/L (ref 3.5–5.1)
Sodium: 137 mmol/L (ref 135–145)

## 2022-11-29 LAB — GLUCOSE, CAPILLARY
Glucose-Capillary: 111 mg/dL — ABNORMAL HIGH (ref 70–99)
Glucose-Capillary: 139 mg/dL — ABNORMAL HIGH (ref 70–99)
Glucose-Capillary: 157 mg/dL — ABNORMAL HIGH (ref 70–99)
Glucose-Capillary: 220 mg/dL — ABNORMAL HIGH (ref 70–99)
Glucose-Capillary: 292 mg/dL — ABNORMAL HIGH (ref 70–99)
Glucose-Capillary: 310 mg/dL — ABNORMAL HIGH (ref 70–99)

## 2022-11-29 LAB — MAGNESIUM: Magnesium: 2 mg/dL (ref 1.7–2.4)

## 2022-11-29 LAB — CBC
HCT: 33.5 % — ABNORMAL LOW (ref 36.0–46.0)
Hemoglobin: 10.5 g/dL — ABNORMAL LOW (ref 12.0–15.0)
MCH: 28.5 pg (ref 26.0–34.0)
MCHC: 31.3 g/dL (ref 30.0–36.0)
MCV: 91 fL (ref 80.0–100.0)
Platelets: 265 10*3/uL (ref 150–400)
RBC: 3.68 MIL/uL — ABNORMAL LOW (ref 3.87–5.11)
RDW: 14.9 % (ref 11.5–15.5)
WBC: 8.5 10*3/uL (ref 4.0–10.5)
nRBC: 0 % (ref 0.0–0.2)

## 2022-11-29 MED ORDER — BRINZOLAMIDE 1 % OP SUSP
1.0000 [drp] | Freq: Three times a day (TID) | OPHTHALMIC | Status: DC
  Administered 2022-11-29 – 2022-11-30 (×4): 1 [drp] via OPHTHALMIC
  Filled 2022-11-29: qty 10

## 2022-11-29 MED ORDER — HYDROCODONE-ACETAMINOPHEN 5-325 MG PO TABS
1.0000 | ORAL_TABLET | Freq: Once | ORAL | Status: DC | PRN
  Filled 2022-11-29: qty 1

## 2022-11-29 MED ORDER — INSULIN ASPART 100 UNIT/ML IJ SOLN: 0.0000 [IU] | Freq: Three times a day (TID) | INTRAMUSCULAR | Status: DC

## 2022-11-29 MED ORDER — ATROPINE SULFATE 1 % OP SOLN
1.0000 [drp] | Freq: Every day | OPHTHALMIC | Status: DC
  Administered 2022-11-29: 1 [drp] via OPHTHALMIC
  Filled 2022-11-29: qty 2

## 2022-11-29 MED ORDER — ONDANSETRON 4 MG PO TBDP
8.0000 mg | ORAL_TABLET | Freq: Once | ORAL | Status: AC
  Administered 2022-11-29: 8 mg via ORAL
  Filled 2022-11-29: qty 2

## 2022-11-29 MED ORDER — BRIMONIDINE TARTRATE 0.2 % OP SOLN
1.0000 [drp] | Freq: Three times a day (TID) | OPHTHALMIC | Status: DC
  Administered 2022-11-29 – 2022-11-30 (×4): 1 [drp] via OPHTHALMIC
  Filled 2022-11-29: qty 5

## 2022-11-29 MED ORDER — OXYCODONE-ACETAMINOPHEN 5-325 MG PO TABS: 1.0000 | ORAL_TABLET | Freq: Once | ORAL | Status: DC | PRN

## 2022-11-29 MED ORDER — INSULIN ASPART 100 UNIT/ML IJ SOLN
0.0000 [IU] | Freq: Three times a day (TID) | INTRAMUSCULAR | Status: DC
  Administered 2022-11-29: 3 [IU] via SUBCUTANEOUS
  Administered 2022-11-29 – 2022-11-30 (×3): 11 [IU] via SUBCUTANEOUS

## 2022-11-29 MED ORDER — OXYCODONE-ACETAMINOPHEN 5-325 MG PO TABS
1.0000 | ORAL_TABLET | Freq: Once | ORAL | Status: AC | PRN
  Administered 2022-11-29: 1 via ORAL
  Filled 2022-11-29: qty 1

## 2022-11-29 NOTE — Progress Notes (Signed)
     Daily Progress Note Intern Pager: [phone]  Patient name: [name] Medical record number: [id number] Date of birth: [birth year] Age: 46 y.o. Gender: female  Primary Care Provider: [name], MD Consultants: None Code Status: Full code  Pt Overview and Major Events to Date:  12/24-admitted, placed on BiPAP  Assessment and Plan:  [name] is a 46 y.o. female presenting with shortness of breath in the setting of likely CHF exacerbation as well as underlying OHS.  Also with recent history of cocaine use.  Was placed on BiPAP on admission due to respiratory acidosis on VBG.  Pertinent PMH/PSH includes HLD, COPD, T2DM, GERD, diabetic neuropathy, HTN, CKD 3, OSA.   * Acute exacerbation of CHF (congestive heart failure) ([name]) BNP elevated on admission.  Respiratory status improving with BiPAP, O2 sats in the upper 90s.  Transitioned to 3L [place] this morning. -Continue to wean as tolerated, may consider sending home with higher dose of o2 -Continue Lasix IV '80mg'$  BID, likely transition to home torsemide as respiratory status stabilizes -PT/OT consulted -Strict I/Os -Daily weights -AM BMP  Type 2 diabetes mellitus with hyperglycemia ([name]) BGL 400s upon admission. H/o resistant T2DM requiring high dose insulin. Takes 60U LAI BID and 66A SAI TID, uncertain medication compliance. Patient given 25U Aspart in the ED. Asymptomatic -Semglee 40U BID -CBGs + resistant SSI    Glaucoma (chronic) - restarted home eye drops    FEN/GI: Carb modified diet PPx: Lovenox Dispo:Home pending clinical improvement . Barriers include respiratory status.   Subjective:  No acute concerns this morning.  Feeling like her mouth is little dry.  States that she used 2 to 3 L O2 at home as needed, but feels that she might need more now.  States that she was consistently using her CPAP at night.  Denies chest pain.  Endorses chronic right leg pain.  Objective: Temp:  [97.9 F (36.6 C)-98.5  F (36.9 C)] 97.9 F (36.6 C) (12/25 1037) Pulse Rate:  [80-97] 97 (12/25 1037) Resp:  [10-20] 20 (12/25 1037) BP: (120-160)/(61-127) 138/76 (12/25 1037) SpO2:  [93 %-99 %] 94 % (12/25 1037) FiO2 (%):  [32 %-40 %] 32 % (12/25 0923) Weight:  [174 kg-174.4 kg] 174.4 kg (12/25 0400) Physical Exam: General: Alert, NAD, appropriately responsive Cardiovascular: RRR no MRG Respiratory: Breathing comfortably on BiPAP, expiratory wheezes from upper airway Abdomen: NT/ND Extremities: 1+ pitting edema RLE  Laboratory: Most recent CBC Lab Results  Component Value Date   WBC 8.5 11/29/2022   HGB 10.5 (L) 11/29/2022   HCT 33.5 (L) 11/29/2022   MCV 91.0 11/29/2022   PLT 265 11/29/2022   Most recent BMP    Latest Ref Rng & Units 11/29/2022   12:40 AM  BMP  Glucose 70 - 99 mg/dL 212   BUN 6 - 20 mg/dL 57   Creatinine 0.44 - 1.00 mg/dL 1.45   Sodium 135 - 145 mmol/L 137   Potassium 3.5 - 5.1 mmol/L 3.9   Chloride 98 - 111 mmol/L 96   CO2 22 - 32 mmol/L 34   Calcium 8.9 - 10.3 mg/dL 8.7     Mg 2.0   August Albino, MD 11/29/2022, 12:41 PM  PGY-1, [place] Intern pager: [phone], text pages welcome Secure chat group [place]

## 2022-11-30 LAB — GLUCOSE, CAPILLARY
Glucose-Capillary: 273 mg/dL — ABNORMAL HIGH (ref 70–99)
Glucose-Capillary: 266 mg/dL — ABNORMAL HIGH (ref 70–99)

## 2022-11-30 LAB — HEMOGLOBIN A1C
Hgb A1c MFr Bld: 11 % — ABNORMAL HIGH (ref 4.8–5.6)
Mean Plasma Glucose: 269 mg/dL

## 2022-11-30 LAB — BASIC METABOLIC PANEL
Anion gap: 7 (ref 5–15)
BUN: 49 mg/dL — ABNORMAL HIGH (ref 6–20)
CO2: 34 mmol/L — ABNORMAL HIGH (ref 22–32)
Calcium: 8.7 mg/dL — ABNORMAL LOW (ref 8.9–10.3)
Chloride: 97 mmol/L — ABNORMAL LOW (ref 98–111)
Creatinine, Ser: 1.18 mg/dL — ABNORMAL HIGH (ref 0.44–1.00)
GFR, Estimated: 58 mL/min — ABNORMAL LOW (ref >60)
Glucose, Bld: 279 mg/dL — ABNORMAL HIGH (ref 70–99)
Potassium: 3.9 mmol/L (ref 3.5–5.1)
Sodium: 138 mmol/L (ref 135–145)

## 2022-11-30 MED ORDER — TRAZODONE HCL 100 MG PO TABS
100.0000 mg | ORAL_TABLET | Freq: Every evening | ORAL | Status: DC | PRN
  Administered 2022-11-30: 100 mg via ORAL
  Filled 2022-11-30: qty 1

## 2022-11-30 MED ORDER — ONDANSETRON HCL 4 MG/2ML IJ SOLN
4.0000 mg | Freq: Once | INTRAMUSCULAR | Status: AC | PRN
  Administered 2022-11-30: 4 mg via INTRAVENOUS
  Filled 2022-11-30: qty 2

## 2022-11-30 NOTE — TOC Transition Note (Signed)
Transition of Care [facility]) - CM/SW Discharge Note   Patient Details  Name: [name] MRN: [id number] Date of Birth: 30-Mar-1976  Transition of Care [facility]) CM/SW Contact:  Tom-Johnson, Renea Ee, RN Phone Number: 11/30/2022, 11:53 AM   Clinical Narrative:     Patient is scheduled for discharge today. Outpatient PT/OT order and info on AVS. Patient states she had outpatient PT at [facility] before and would like to return. Patient is on home O2 and CPAP and gets supplies from [employer].  PTAR scheduled for transportation at discharge. No further TOC needs noted.    Final next level of care: OP Rehab Barriers to Discharge: Barriers Resolved   Patient Goals and CMS Choice CMS Medicare.gov Compare Post Acute Care list provided to:: Patient    Discharge Placement                  Patient to be transferred to facility by: [place]      Discharge Plan and Services Additional resources added to the After Visit Summary for                  DME Arranged: N/A DME Agency: NA       HH Arranged: NA HH Agency: NA        Social Determinants of Health ([place]) Interventions [place]: No Food Insecurity (11/28/2022)  Housing: Low Risk  (11/28/2022)  Recent Concern: Housing - Medium Risk (09/20/2022)  Transportation Needs: No Transportation Needs (11/28/2022)  Utilities: Not At Risk (11/28/2022)  Alcohol Screen: Low Risk  (10/10/2017)  Depression (PHQ2-9): Medium Risk (11/04/2022)  Financial Resource Strain: High Risk (02/19/2021)  Stress: Stress Concern Present (02/19/2021)  Tobacco Use: Medium Risk (11/28/2022)     Readmission Risk Interventions    05/24/2022   11:33 AM  Readmission Risk Prevention Plan  Transportation Screening Complete  Medication Review ([place]) Complete  PCP or Specialist appointment within 3-5 days of discharge Complete  HRI or [place] Complete  SW Recovery Care/Counseling Consult Complete  [place] Patient Refused

## 2022-11-30 NOTE — Progress Notes (Signed)
Heart Failure Nurse Navigator Progress Note   Patient declined appointment for HF TOC. Wants to stay with Dr. [name], has a scheduled appointment for 12/16/2022. Patient was educated on the sign and symptoms of Heart failure, daily weights, diet/ fluids, taking all medications as provided and attending all medical appointments.   Navigator will sign off at this time.   Earnestine Leys, BSN, Clinical cytogeneticist Only

## 2022-11-30 NOTE — Progress Notes (Addendum)
FMTS Interim Progress Note  S: Was paged that pt was complaining of leg pain. Went to see pt, she reports that her R ankle is throbbing and painful. Worse than her normal ankle pain. Denies pain above ankle. Discussed that more pain medications may not be safe given her resp status. She requests her home Trazadone. Pt also agreeable to adding a heat pack for her R ankle. - Heat pack for R ankle - Restart home Trazadone   PE: Was able to deeply palpate R ankle and foot without eliciting strong pain response. Normal Rom of R ankle.  Arlyce Dice, MD 11/30/2022, 12:26 AM PGY-1, [place] [facility] Service pager [phone]

## 2022-11-30 NOTE — Progress Notes (Signed)
Pt found on [place] 3L and tolerating. Pt removed CPAP and is stable at this time. Will continue to monitor

## 2022-11-30 NOTE — Discharge Instructions (Addendum)
Dear [name],   Thank you for letting us participate in your care! In this section, you will find a brief hospital admission summary of why you were admitted to the hospital, what happened during your admission, your diagnosis/diagnoses, and recommended follow up.  Primary diagnosis: Acute heart failure exacerbation Treatment plan: You received BiPAP and IV Lasix to urinate and get the fluid off of you.  Please continue to take your torsemide at home. Secondary diagnosis: Poorly controlled diabetes Treatment plan: He received insulin management while you were here and were restarted on your insulin medication at home.   POST-HOSPITAL & CARE INSTRUCTIONS We recommend following up with your PCP within 1 week from being discharged from the hospital. Please let PCP/Specialists know of any changes in medications that were made which you will be able to see in the medications section of this packet.  DOCTOR'S APPOINTMENTS & FOLLOW UP Future Appointments  Date Time Provider [place]  12/09/2022  9:00 AM Martyn Ehrich, NP [facility] None  12/16/2022 11:10 AM [name], MD [facility]  12/21/2022 10:50 AM Shamleffer, Melanie Crazier, MD [facility] None     Thank you for choosing [facility]! Take care and be well!  [facility]  [place], [place] [address] [phone]

## 2022-11-30 NOTE — Discharge Summary (Addendum)
[facility] Discharge Summary  Patient name: [name] Medical record number: [id number] Date of birth: [birth year] Age: 46 y.o. Gender: female Date of Admission: 11/28/2022  Date of Discharge: 11/30/22 Admitting Physician: Lenoria Chime, MD  Primary Care Provider: [name], MD Consultants: none  Indication for Hospitalization: Acute on chronic CHF  Discharge Diagnoses/Problem List:  Principal Problem for Admission: CHF Other Problems addressed during stay:  Principal Problem:   Acute exacerbation of CHF (congestive heart failure) ([place]) Active Problems:   Type 2 diabetes mellitus with hyperglycemia [facility])  Brief Hospital Course:  [name] is a 46 y.o. female who presented with shortness of breath. PMH significant for HFpEF, OHS, OSA, T2DM, HTN, HLD, COPD, GERD, and CKD3.  Acute CHF Exacerbation OHS OSA Patient presented with several days of worsening dyspnea. Clinical picture consistent with CHF exacerbation given CXR with pulmonary vascular congestion and elevated BNP to 686. Etiology of exacerbation secondary to cocaine use vs suboptimal medication/BiPAP adherence. Was hypercarbic on admission, but improved with IV diuresis and BiPAP. Patient discharged on home Torsemide '80mg'$  BID. Encouraged continued CPAP use at night as well as prn supplemental O2 during the day.  Prior echos have not been helpful in evaluating heart function/structure due to limited views (BMI 70).  Seen by PT/OT who recommended outpatient f/u.  T2DM While on BiPAP, pt remained NPO and on 40U Semglee BID with QID SSI. Blood glucose ranged in the 200s. Upon discharge, was placed back on home regimen of 60U BID LAI + 30U TID mealtime. A1c was 11.  PCP follow up recommendations Ensure adequate diuresis and DM control UDS negative for opiates, unlikely she is taking what was prescribed. Consider discontinuing as there was also cocaine in UDS. 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 Pt with difficult social/home situation - living in a hotel with her daughter, recently broke her electric wheelchair. Stressors are also contributing to substance use  Disposition: home  Discharge Condition: stable, improved  Discharge Exam:  Vitals:   11/30/22 0828 11/30/22 0830  BP:  (!) 170/99  Pulse:  100  Resp:  12  Temp: 97.8 F (36.6 C)   SpO2:     Gen: NAD, sitting up in bed CV: mildly tachycardic, regular rhythm, difficult to appreciate due to body habitus Pulm: CTAB, difficult to appreciate, breathing comfortably on 2.5L  Abd: soft NT/ND Ext: Trace pitting edema RLE  Significant Procedures: n/a  Significant Labs and Imaging:  Recent Labs  Lab 11/28/22 1110 11/29/22 0040  WBC  --  8.5  HGB 11.9* 10.5*  HCT 35.0* 33.5*  PLT  --  265   Recent Labs  Lab 11/28/22 1110 11/29/22 0040 11/30/22 0043  NA 134* 137 138  K 4.4 3.9 3.9  CL  --  96* 97*  CO2  --  34* 34*  GLUCOSE  --  212* 279*  BUN  --  57* 49*  CREATININE  --  1.45* 1.18*  CALCIUM  --  8.7* 8.7*  MG  --  2.0  --     Imaging  CXR IMPRESSION: Cardiomegaly with pulmonary vascular congestion.  Results/Tests Pending at Time of Discharge: n/a  Discharge Medications:  Allergies as of 11/30/2022       Reactions   Cefepime Other (See Comments)   AKI, see records from [place] hospitalization in January 2020.   Other reaction(s): Other (See Comments) "Shut down organs/kidneys" Note pt has tolerated Rocephin and Keflex  Other reaction(s): Not available   Iodine Other (See Comments)   Kidney dysfunction   Kiwi Extract Shortness Of Breath, Swelling, Anaphylaxis   Other reaction(s): Not available   Morphine And Related Nausea And Vomiting   Nalbuphine Other (See Comments)   Hallucinations Other reaction(s): Not available   Pentoxifylline Nausea And Vomiting   Other reaction(s): Not available   Cephalosporins Other (See Comments)   Pt  states that they cause her kidneys to shut down   Toradol [ketorolac Tromethamine] Other (See Comments)   Feels like something is crawling on me   Morphine Nausea And Vomiting   Other reaction(s): Not available   Nubain [nalbuphine Hcl] Other (See Comments)   "FEELS LIKE SOMETHING CRAWLING ON ME"        Medication List     STOP taking these medications    cephALEXin 500 MG capsule Commonly known as: KEFLEX   terconazole 0.4 % vaginal cream Commonly known as: TERAZOL 7       TAKE these medications    acetaminophen 325 MG tablet Commonly known as: TYLENOL Take 1-2 tablets (325-650 mg total) by mouth every 4 (four) hours as needed for mild pain.   amLODipine 10 MG tablet Commonly known as: NORVASC Take 1 tablet (10 mg total) by mouth daily.   aspirin EC 81 MG tablet Take 1 tablet (81 mg total) by mouth daily. Swallow whole.   atropine 1 % ophthalmic solution Place 1 drop into the right eye daily.   cetirizine 10 MG tablet Commonly known as: ZYRTEC Take 1 tablet (10 mg total) by mouth daily as needed for allergies.   Dexcom G6 Sensor Misc Inject 1 applicator into the skin as directed. Change sensor every 10 days.   Dexcom G6 Transmitter Misc Inject 1 Device into the skin as directed. Reuse 8 times with sensor changes.   DULoxetine 60 MG capsule Commonly known as: CYMBALTA TAKE 2 CAPSULES BY MOUTH DAILY   gabapentin 600 MG tablet Commonly known as: NEURONTIN TAKE 2 TABLETS BY MOUTH 3 TIMES DAILY What changed: See the new instructions.   GNP UltiCare Pen Needles 32G X 4 MM Misc Generic drug: Insulin Pen Needle USE TO inject insulin 2 TIMES DAILY   ipratropium-albuterol 0.5-2.5 (3) MG/3ML Soln Commonly known as: DUONEB Take 3 mLs by nebulization every 6 (six) hours as needed. What changed: reasons to take this   nitroGLYCERIN 0.4 MG SL tablet Commonly known as: NITROSTAT Place 1 tablet (0.4 mg total) under the tongue every 5 (five) minutes as needed for  chest pain.   NovoLOG FlexPen 100 UNIT/ML FlexPen Generic drug: insulin aspart Inject 20 Units into the skin 3 (three) times daily with meals. What changed: how much to take   nystatin powder Commonly known as: MYCOSTATIN/NYSTOP Apply 1 application. topically 3 (three) times daily.   Omnipod 5 G6 Pod (Gen 5) Misc Inject into the skin daily.   ondansetron 4 MG tablet Commonly known as: Zofran Take 1 tablet (4 mg total) by mouth every 8 (eight) hours as needed for nausea or vomiting.   oxyCODONE-acetaminophen 5-325 MG tablet Commonly known as: PERCOCET/ROXICET Take 1 tablet by mouth daily as needed for severe pain.   pantoprazole 40 MG tablet Commonly known as: PROTONIX Take 1 tablet (40 mg total) by mouth daily.   polyethylene glycol powder 17 GM/SCOOP powder Commonly known as: MiraLax Take 17 g by mouth daily as needed (constipation).   ProAir HFA 108 (90 Base) MCG/ACT inhaler Generic drug: albuterol INHALE 1  TO 2 PUFFS BY MOUTH EVERY 6 HOURS AS NEEDED FOR WHEEZING OR SHORTNESS OF BREATH What changed: See the new instructions.   rOPINIRole 1 MG tablet Commonly known as: REQUIP Take 1 mg by mouth at bedtime.   rosuvastatin 40 MG tablet Commonly known as: Crestor Take 1 tablet (40 mg total) by mouth daily.   Simbrinza 1-0.2 % Susp Generic drug: Brinzolamide-Brimonidine Place 1 drop into the left eye in the morning, at noon, and at bedtime.   Symbicort 160-4.5 MCG/ACT inhaler Generic drug: budesonide-formoterol INHALE 2 PUFFS into THE lungs 2 TIMES DAILY   torsemide 20 MG tablet Commonly known as: DEMADEX Take 4 tablets (80 mg total) by mouth 2 (two) times daily. What changed:  how much to take when to take this additional instructions   traZODone 100 MG tablet Commonly known as: DESYREL TAKE 1 TABLET BY MOUTH AT BEDTIME AS NEEDED FOR SLEEP What changed: reasons to take this   [place] FlexTouch 200 UNIT/ML FlexTouch Pen Generic drug: insulin  degludec Inject 52 Units into the skin 2 (two) times daily. What changed: how much to take   Vitamin D (Cholecalciferol) 25 MCG (1000 UT) Caps Take 1,000 Units by mouth daily.       Discharge Instructions: Please refer to Patient Instructions section of EMR for full details.  Patient was counseled important signs and symptoms that should prompt return to medical care, changes in medications, dietary instructions, activity restrictions, and follow up appointments.   Follow-Up Appointments:  [facility] 12/02/22 0930  August Albino, MD 11/30/2022, 10:46 AM PGY-1, [place] Upper-Level Resident Addendum   I have independently interviewed and examined the patient. I have discussed the above with Dr. Joeseph Amor and agree with the documented plan. My edits for correction/addition/clarification are included above. Please see any attending notes.   Wells Guiles, DO PGY-3, [facility] 11/30/2022 10:51 AM  FPTS Service pager: [phone] (text pages welcome through [facility])

## 2022-12-01 ENCOUNTER — Encounter: Payer: Commercial Managed Care - HMO | Admitting: Podiatry

## 2022-12-01 ENCOUNTER — Telehealth: Payer: Self-pay

## 2022-12-01 IMAGING — CT CT HEAD W/O CM
4 series · 16 of 47 positions shown, 18 images · non-contrast
Comparison: 04/17/2021

CLINICAL DATA: Altered mental status

EXAM:
CT HEAD WITHOUT CONTRAST
TECHNIQUE: Contiguous axial images were obtained from the base of the skull
through the vertex without intravenous contrast.

[Series 3: head wo · axial · 0.38mm/px · z∈[-160,-40]mm · 7 of 32 slices shown, 9 images]
[im 4/32  brain]
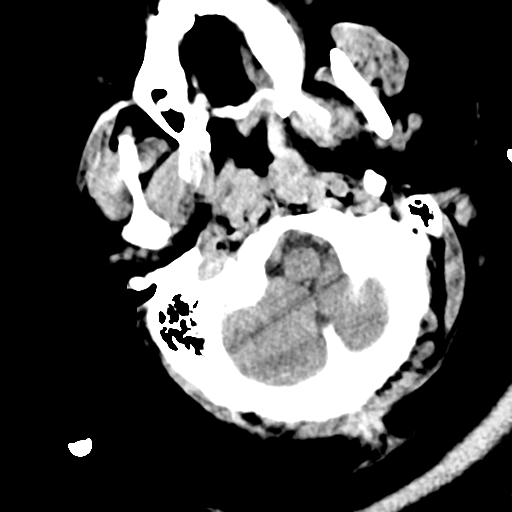
[im 4/32  bone]
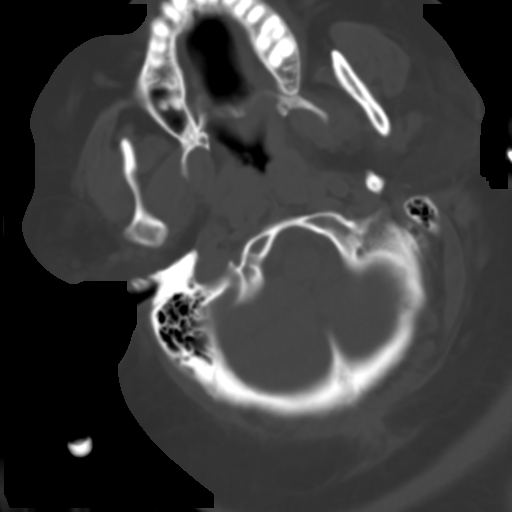
[im 8/32  brain]
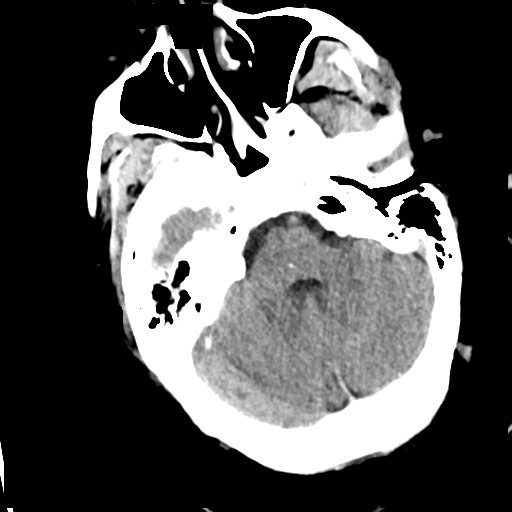
[im 12/32  brain]
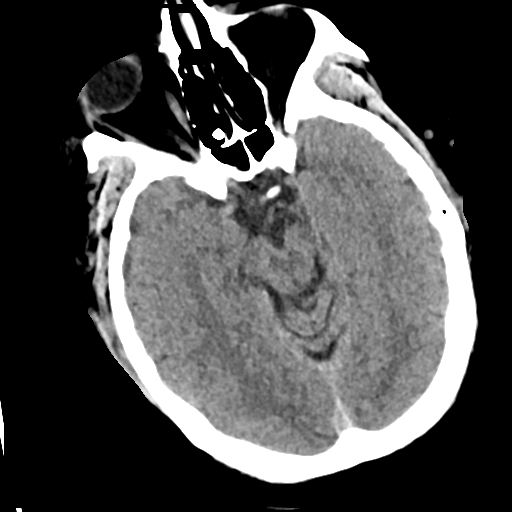
[im 16/32  brain]
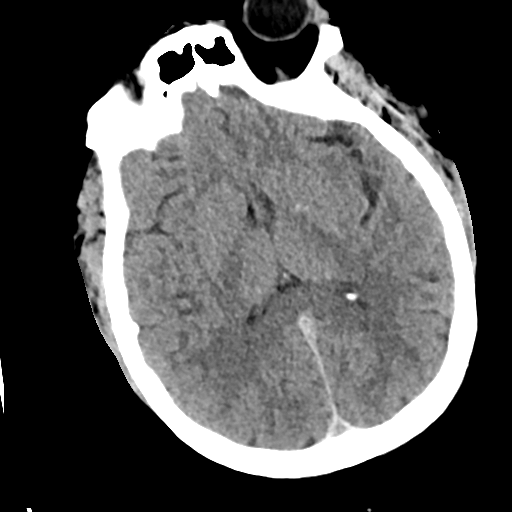
[im 20/32  brain]
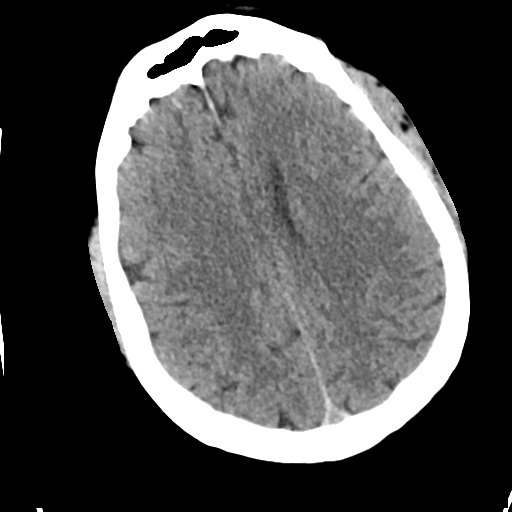
[im 20/32  bone]
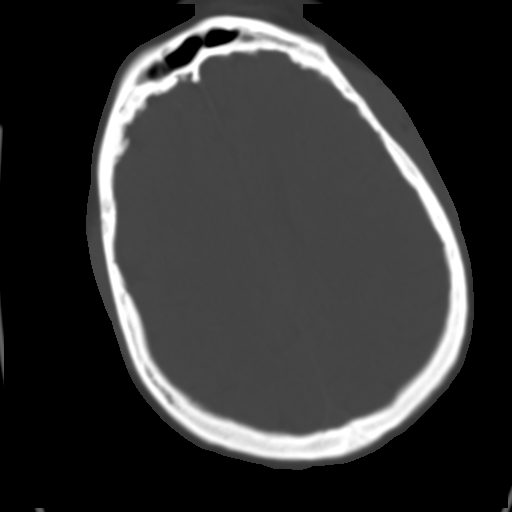
[im 24/32  brain]
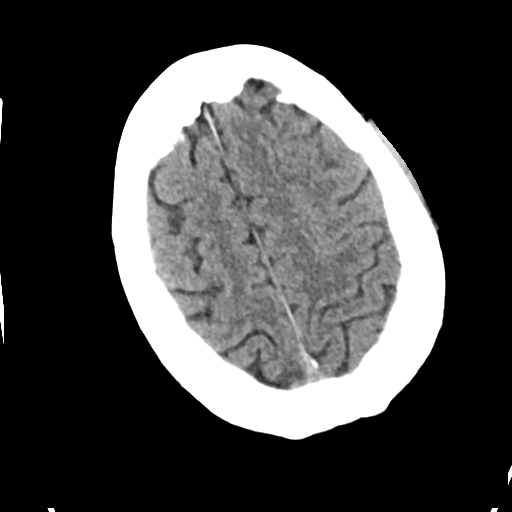
[im 28/32  brain]
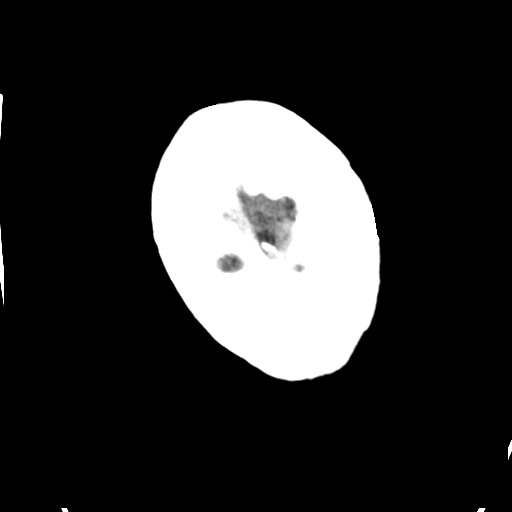

[Series 4: head bone · axial · 0.38mm/px · z∈[-162,-130]mm · 3 of 80 slices shown]
[im 8/80  bone]
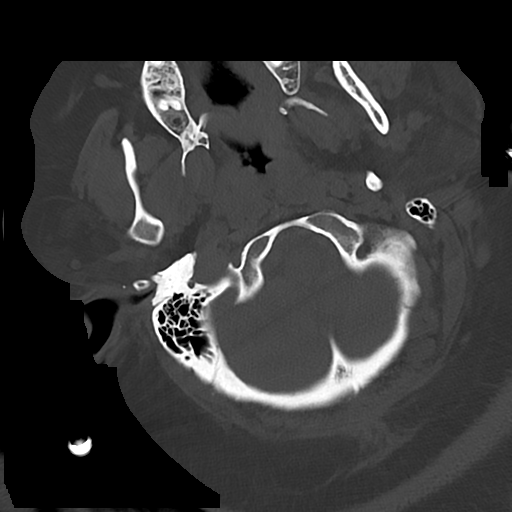
[im 16/80  bone]
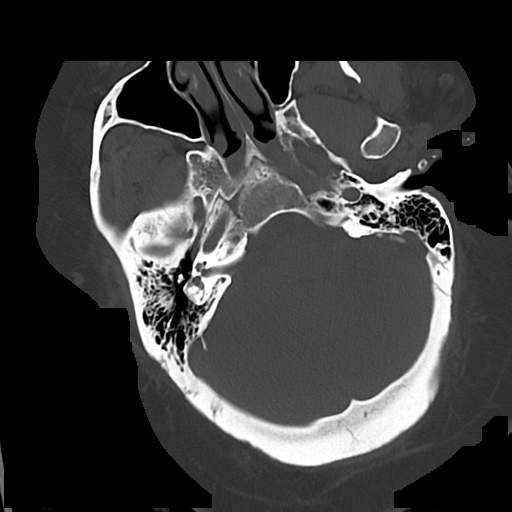
[im 24/80  bone]
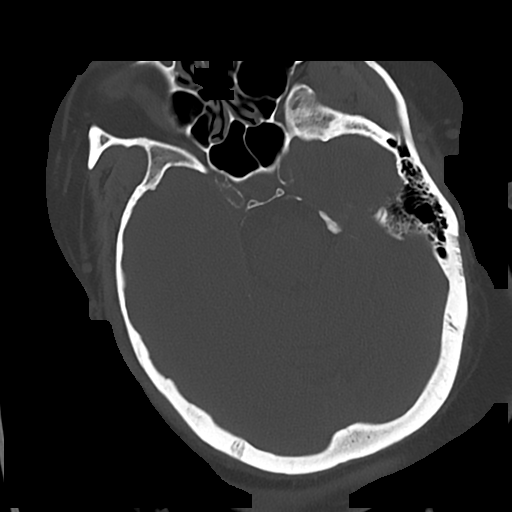

[Series 5: cor soft · coronal · 0.29mm/px · 3 of 67 slices shown]
[im 23/67  brain]
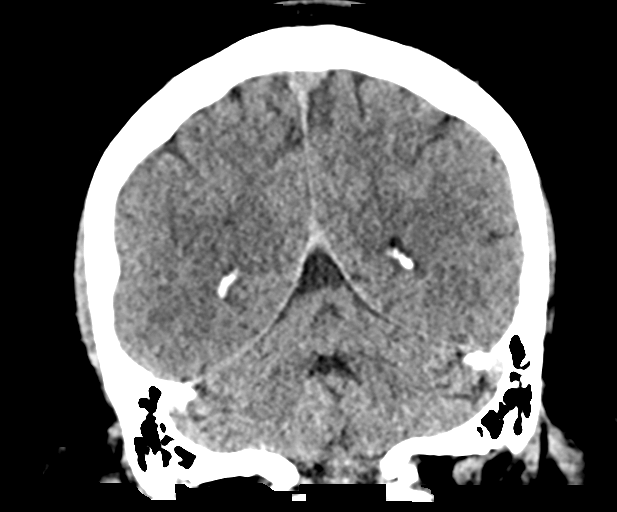
[im 30/67  brain]
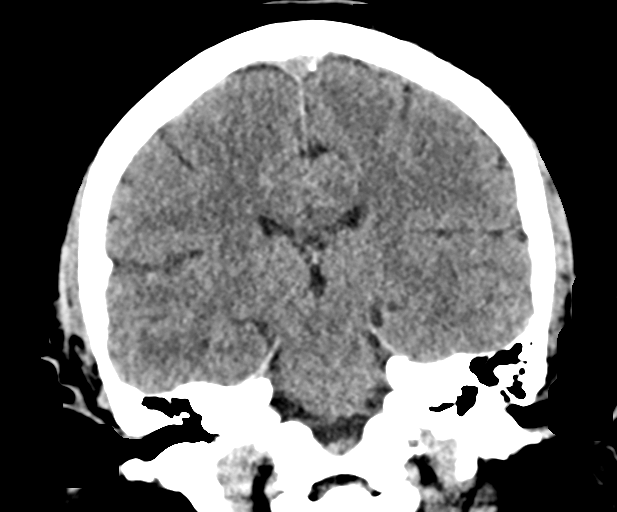
[im 37/67  brain]
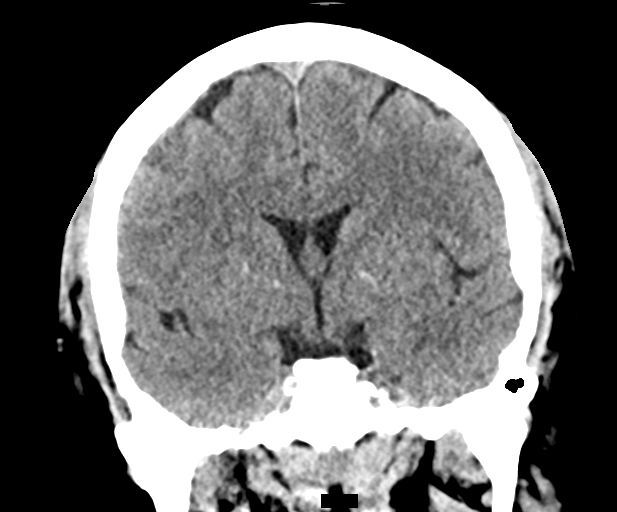

[Series 6: sag soft · sagittal · 0.30mm/px · 3 of 56 slices shown]
[im 19/56  brain]
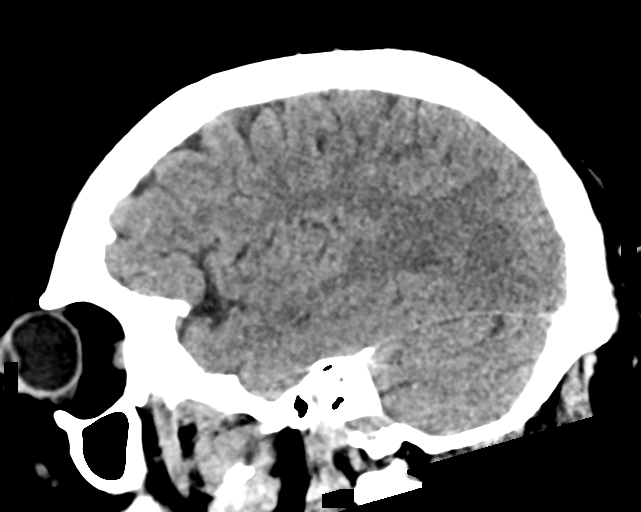
[im 28/56  brain]
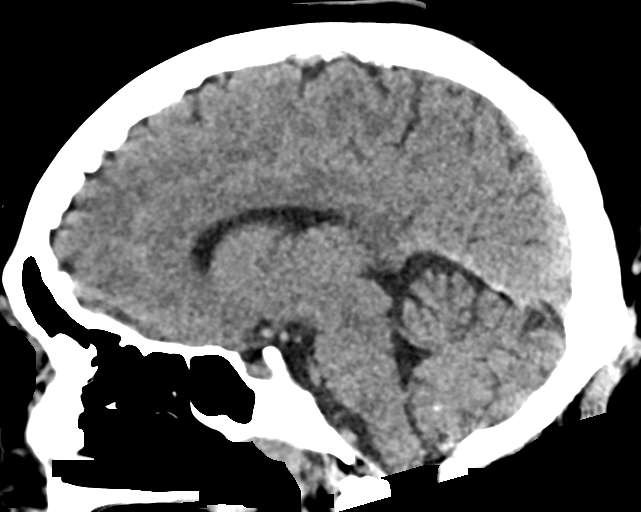
[im 37/56  brain]
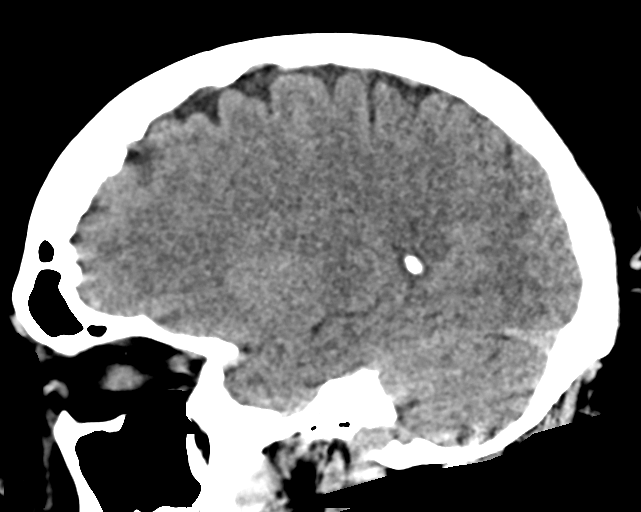

[16 of 47 positions shown; findings below may reference images not displayed]

FINDINGS: Brain: No evidence of acute infarction, hemorrhage, hydrocephalus,
extra-axial collection or mass lesion/mass effect.

Vascular: No hyperdense vessel or unexpected calcification.

Skull: Normal. Negative for fracture or focal lesion.

Sinuses/Orbits: No acute abnormality noted. Stable osteoma in a
right posterior ethmoid air cell is noted.

Other: None.
IMPRESSION: No acute intracranial abnormality noted.

## 2022-12-01 NOTE — Telephone Encounter (Signed)
Transition Care Management Unsuccessful Follow-up Telephone Call  Date of discharge and from where:  [facility] 11/30/2022  Attempts:  1st Attempt  Reason for unsuccessful TCM follow-up call:  Left voice message Juanda Crumble, [place] Direct Dial [phone]

## 2022-12-02 ENCOUNTER — Inpatient Hospital Stay: Payer: Self-pay

## 2022-12-02 NOTE — Progress Notes (Deleted)
  SUBJECTIVE:   CHIEF COMPLAINT / HPI:   Hospital follow-up: Admitted for acute CHF exacerbation and diuresed with IV medications and discharged back on home torsemide 80 mg twice daily.  Further, difficult to control diabetes and placed back on her home regimen of 60 units twice daily long-acting insulin +30 units 3 times daily with meals on.  PERTINENT  PMH / PSH: T2DM, HTN, HLD, OSA, PAD, CHF  OBJECTIVE:  There were no vitals taken for this visit. Physical Exam   ASSESSMENT/PLAN:  There are no diagnoses linked to this encounter. No follow-ups on file. Wells Guiles, DO 12/02/2022, 8:10 AM PGY-***, [facility] {    This will disappear when note is signed, click to select method of visit    :1}

## 2022-12-02 NOTE — Telephone Encounter (Signed)
Transition Care Management Follow-up Telephone Call Date of discharge and from where: [facility] 11/30/2022 How have you been since you were released from the hospital? better Any questions or concerns? No  Items Reviewed: Did the pt receive and understand the discharge instructions provided? Yes  Medications obtained and verified? Yes  Other? No  Any new allergies since your discharge? No  Dietary orders reviewed? Yes Do you have support at home? Yes   Home Care and Equipment/Supplies: Were home health services ordered? no If so, what is the name of the agency? N/a  Has the agency set up a time to come to the patient's home? not applicable Were any new equipment or medical supplies ordered?  No What is the name of the medical supply agency? N/a Were you able to get the supplies/equipment? no Do you have any questions related to the use of the equipment or supplies? no  Functional Questionnaire: (I = Independent and D = Dependent) ADLs: I  Bathing/Dressing- I  Meal Prep- I  Eating- I  Maintaining continence- I  Transferring/Ambulation- I  Managing Meds- I  Follow up appointments reviewed:  PCP Hospital f/u appt confirmed? Yes  Scheduled to see Dr [name] on 12/02/2022 @ 9:30. [place] [facility] f/u appt confirmed? No  Are transportation arrangements needed? No  If their condition worsens, is the pt aware to call PCP or go to the Emergency Dept.? Yes Was the patient provided with contact information for the PCP's office or ED? Yes Was to pt encouraged to call back with questions or concerns? Yes  Juanda Crumble, LPN [place] Direct Dial [phone]

## 2022-12-06 DIAGNOSIS — G4733 Obstructive sleep apnea (adult) (pediatric): Secondary | ICD-10-CM | POA: Diagnosis not present

## 2022-12-06 DIAGNOSIS — M86172 Other acute osteomyelitis, left ankle and foot: Secondary | ICD-10-CM | POA: Diagnosis not present

## 2022-12-06 DIAGNOSIS — J45909 Unspecified asthma, uncomplicated: Secondary | ICD-10-CM | POA: Diagnosis not present

## 2022-12-06 DIAGNOSIS — E669 Obesity, unspecified: Secondary | ICD-10-CM | POA: Diagnosis not present

## 2022-12-08 ENCOUNTER — Emergency Department (HOSPITAL_COMMUNITY): Payer: Medicaid Other

## 2022-12-08 ENCOUNTER — Emergency Department (HOSPITAL_COMMUNITY)
Admission: EM | Admit: 2022-12-08 | Discharge: 2022-12-09 | Disposition: A | Discharge status: Home / Self Care | Payer: Medicaid Other | Attending: Emergency Medicine | Admitting: Emergency Medicine

## 2022-12-08 DIAGNOSIS — E1140 Type 2 diabetes mellitus with diabetic neuropathy, unspecified: Secondary | ICD-10-CM | POA: Insufficient documentation

## 2022-12-08 DIAGNOSIS — U071 COVID-19: Secondary | ICD-10-CM | POA: Insufficient documentation

## 2022-12-08 DIAGNOSIS — Z7951 Long term (current) use of inhaled steroids: Secondary | ICD-10-CM | POA: Insufficient documentation

## 2022-12-08 DIAGNOSIS — Z7982 Long term (current) use of aspirin: Secondary | ICD-10-CM | POA: Diagnosis not present

## 2022-12-08 DIAGNOSIS — E1169 Type 2 diabetes mellitus with other specified complication: Secondary | ICD-10-CM | POA: Insufficient documentation

## 2022-12-08 DIAGNOSIS — J449 Chronic obstructive pulmonary disease, unspecified: Secondary | ICD-10-CM | POA: Diagnosis not present

## 2022-12-08 DIAGNOSIS — N189 Chronic kidney disease, unspecified: Secondary | ICD-10-CM | POA: Diagnosis not present

## 2022-12-08 DIAGNOSIS — Z79899 Other long term (current) drug therapy: Secondary | ICD-10-CM | POA: Diagnosis not present

## 2022-12-08 DIAGNOSIS — I130 Hypertensive heart and chronic kidney disease with heart failure and stage 1 through stage 4 chronic kidney disease, or unspecified chronic kidney disease: Secondary | ICD-10-CM | POA: Diagnosis not present

## 2022-12-08 DIAGNOSIS — Z794 Long term (current) use of insulin: Secondary | ICD-10-CM | POA: Insufficient documentation

## 2022-12-08 DIAGNOSIS — E785 Hyperlipidemia, unspecified: Secondary | ICD-10-CM | POA: Insufficient documentation

## 2022-12-08 DIAGNOSIS — R0789 Other chest pain: Secondary | ICD-10-CM | POA: Diagnosis present

## 2022-12-08 DIAGNOSIS — J45909 Unspecified asthma, uncomplicated: Secondary | ICD-10-CM | POA: Insufficient documentation

## 2022-12-08 DIAGNOSIS — I5033 Acute on chronic diastolic (congestive) heart failure: Secondary | ICD-10-CM | POA: Insufficient documentation

## 2022-12-08 DIAGNOSIS — Z8616 Personal history of COVID-19: Secondary | ICD-10-CM | POA: Insufficient documentation

## 2022-12-08 LAB — CBC
HCT: 34.1 % — ABNORMAL LOW (ref 36.0–46.0)
Hemoglobin: 10.5 g/dL — ABNORMAL LOW (ref 12.0–15.0)
MCH: 28.4 pg (ref 26.0–34.0)
MCHC: 30.8 g/dL (ref 30.0–36.0)
MCV: 92.2 fL (ref 80.0–100.0)
Platelets: 288 10*3/uL (ref 150–400)
RBC: 3.7 MIL/uL — ABNORMAL LOW (ref 3.87–5.11)
RDW: 15.6 % — ABNORMAL HIGH (ref 11.5–15.5)
WBC: 5.4 10*3/uL (ref 4.0–10.5)
nRBC: 0 % (ref 0.0–0.2)

## 2022-12-08 LAB — TROPONIN I (HIGH SENSITIVITY): Troponin I (High Sensitivity): 14 ng/L (ref <18)

## 2022-12-08 LAB — D-DIMER, QUANTITATIVE: D-Dimer, Quant: 0.55 ug/mL-FEU — ABNORMAL HIGH (ref 0.00–0.50)

## 2022-12-08 LAB — RESP PANEL BY RT-PCR (RSV, FLU A&B, COVID)  RVPGX2
Influenza A by PCR: NEGATIVE
Influenza B by PCR: NEGATIVE
Resp Syncytial Virus by PCR: NEGATIVE
SARS Coronavirus 2 by RT PCR: POSITIVE — AB

## 2022-12-08 LAB — I-STAT BETA HCG BLOOD, ED (MC, WL, AP ONLY): I-stat hCG, quantitative: <5 m[IU]/mL (ref <5)

## 2022-12-08 LAB — BRAIN NATRIURETIC PEPTIDE: B Natriuretic Peptide: 15 pg/mL (ref 0.0–100.0)

## 2022-12-08 NOTE — ED Provider Triage Note (Signed)
Emergency Medicine Provider Triage Evaluation Note  [name] , a 47 y.o. female  was evaluated in triage.  Pt complains of shortness of breath.  Patient reports that she was recently inpatient admitted for several days due to similar symptoms.  She reports that she is currently having increasing shortness of breath on exertion with some chest pain in her central sternum.  Denies nausea, vomiting, abdominal pain, headache.  Review of Systems  Positive: As above Negative: As above  Physical Exam  BP 107/69 (BP Location: Right Arm)   Pulse 84   Temp 98 F (36.7 C) (Oral)   Resp 16   SpO2 98%  Gen:   Awake, no distress   Resp:  Increased work of breathing, no wheezing or crackle MSK:   Moves extremities without difficulty  Other:    Medical Decision Making  Medically screening exam initiated at 4:34 PM.  Appropriate orders placed.  Loie Jahr was informed that the remainder of the evaluation will be completed by another provider, this initial triage assessment does not replace that evaluation, and the importance of remaining in the ED until their evaluation is complete.     Luvenia Heller, PA-C 12/08/22 1636

## 2022-12-08 NOTE — ED Triage Notes (Signed)
Patient BIB [facility] from home for evaulation of chest pain that started a few days ago, denies nausea, denies shortness of breath. Wears 2L O2 [place] at baseline, patient is alert, oriented, and in no apparent distress. Received '324mg'$  ASA , 2x SL NTG from EMS PTA.   BP 128/65 HR 95 SpO2 93% on 3L O2 [place] RR 24

## 2022-12-09 ENCOUNTER — Ambulatory Visit: Payer: Commercial Managed Care - HMO | Admitting: Primary Care

## 2022-12-09 ENCOUNTER — Emergency Department (HOSPITAL_COMMUNITY): Payer: Medicaid Other

## 2022-12-09 ENCOUNTER — Other Ambulatory Visit: Payer: Self-pay

## 2022-12-09 ENCOUNTER — Ambulatory Visit: Payer: Commercial Managed Care - HMO | Admitting: Family Medicine

## 2022-12-09 LAB — COMPREHENSIVE METABOLIC PANEL
ALT: 14 U/L (ref 0–44)
AST: 15 U/L (ref 15–41)
Albumin: 3 g/dL — ABNORMAL LOW (ref 3.5–5.0)
Alkaline Phosphatase: 106 U/L (ref 38–126)
Anion gap: 9 (ref 5–15)
BUN: 52 mg/dL — ABNORMAL HIGH (ref 6–20)
CO2: 36 mmol/L — ABNORMAL HIGH (ref 22–32)
Calcium: 8.5 mg/dL — ABNORMAL LOW (ref 8.9–10.3)
Chloride: 94 mmol/L — ABNORMAL LOW (ref 98–111)
Creatinine, Ser: 1.3 mg/dL — ABNORMAL HIGH (ref 0.44–1.00)
GFR, Estimated: 52 mL/min — ABNORMAL LOW (ref >60)
Glucose, Bld: 126 mg/dL — ABNORMAL HIGH (ref 70–99)
Potassium: 3.6 mmol/L (ref 3.5–5.1)
Sodium: 139 mmol/L (ref 135–145)
Total Bilirubin: 0.3 mg/dL (ref 0.3–1.2)
Total Protein: 8 g/dL (ref 6.5–8.1)

## 2022-12-09 LAB — TROPONIN I (HIGH SENSITIVITY): Troponin I (High Sensitivity): 14 ng/L (ref <18)

## 2022-12-09 MED ORDER — IOHEXOL 350 MG/ML SOLN
75.0000 mL | Freq: Once | INTRAVENOUS | Status: AC | PRN
  Administered 2022-12-09: 75 mL via INTRAVENOUS

## 2022-12-09 MED ORDER — MOLNUPIRAVIR EUA 200MG CAPSULE: 4.0000 | ORAL_CAPSULE | Freq: Two times a day (BID) | ORAL | 0 refills | Status: AC

## 2022-12-09 NOTE — ED Provider Notes (Signed)
[facility] EMERGENCY DEPARTMENT Provider Note  CSN: [id number] Arrival date & time: 12/08/22 1514  Chief Complaint(s) Chest Pain  HPI [name] is a 47 y.o. female with history of COPD, diabetes, CHF presenting to the emergency department with left-sided chest pain.  She reports that the pain is intermittent.  She reports mildly increased shortness of breath.  The pain occasionally radiates to the left arm.  She reports a dry cough, runny nose as well, mild sore throat.  No fevers, chills, nausea or vomiting, abdominal pain, diarrhea.  She reports mild chronic leg swelling which is unchanged.  She reports she uses 0 to 2 L of oxygen at home.  Pain is not clearly pleuritic.  She was recently hospitalized for CHF exacerbation.  She reports her symptoms began around 2 days ago.  She has been compliant with her home medications   Past Medical History Past Medical History:  Diagnosis Date   Abscess of groin, left    Acute on chronic diastolic (congestive) heart failure ([place]) 02/05/2022   Acute on chronic respiratory failure with hypoxia ([name]) 04/11/2022   Acute osteomyelitis of ankle or foot, left ([place]) 01/31/2018   Acute respiratory failure with hypoxia and hypercarbia ([name]) 06/15/2020   Acute tubular necrosis ([name]) 07/12/2022   Alveolar hypoventilation    Amputation stump infection ([place]) 04/09/2018   Anemia    not on iron pill   Asthma    Bipolar 2 disorder ([name])    Candidiasis of vagina 05/15/2022   Carpal tunnel syndrome on right    recurrent   Cellulitis 08/2010-08/2011   Chest pain with low risk for cardiac etiology 05/11/2021   Chronic pain    COPD (chronic obstructive pulmonary disease) ([name])    Symbicort daily and Proventil as needed   Costochondritis    De Quervain's tenosynovitis, bilateral 11/01/2015   Depression    Diabetes mellitus type II, uncontrolled 2000   Type 2, Uncontrolled.Takes Lantus daily.Fasting blood sugar runs 150   Diabetic ulcer of  right foot ([place]) 12/19/2021   Drug-seeking behavior    Elevated troponin    Exposure to mold 04/21/2020   GERD (gastroesophageal reflux disease)    takes Pantoprazole and Zantac daily   HLD (hyperlipidemia)    takes Atorvastatin daily   Hypertension    takes Lisinopril and Coreg daily   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[place]) 04/11/2019   Left shoulder pain 06/23/2019   Ludwig's angina 07/05/2022   Morbid obesity ([place])    Morbid obesity with BMI of 60.0-69.9, adult ([place]) 04/15/2021   Nocturia    Nonobstructive atherosclerosis of coronary artery 11/26/2020   11/26/20 Coronary angiogram: 1.  Mild diffuse proximal LAD stenosis with no evidence of obstructive disease (20 to 30% diffuse stenosis);  2.  Left dominant circumflex with widely patent obtuse marginal branches and left PDA branch, no significant stenoses present  3.  Nondominant RCA with mild diffuse nonobstructive stenosis    OSA on CPAP    Peripheral neuropathy    takes Gabapentin daily   Pneumonia    "walking" several yrs ago and as a baby (12/05/2018)   Pneumonia due to COVID-19 virus 04/14/2021   Primary osteoarthritis of first carpometacarpal joint of left hand 07/30/2016   Rectal fissure    Restless leg    Right carpal tunnel syndrome 09/01/2011   SVT (supraventricular tachycardia)    Syncope 02/25/2016   Thrombocytosis 04/11/2022   Urinary frequency    Urinary incontinence 10/23/2020   UTI (urinary tract infection)  04/15/2021   Varicose veins    Right medial thigh and Left leg    Patient Active Problem List   Diagnosis Date Noted   Acute exacerbation of CHF (congestive heart failure) ([place]) 11/28/2022   Cocaine use 11/05/2022   Cellulitis of right lower extremity 06/10/2022   Peripheral artery disease, right leg, mild([name]) 05/15/2022   Osteomyelitis of foot, right, acute ([place]) 05/14/2022   Wound infection 04/09/2022   Adrenal nodule ([place]) 01/07/2022   Iritis    Chronic kidney disease, stage 3a ([place]) 26/83/4196    Diastolic HF (heart failure) ([place]) 10/19/2021   Impaired mobility 07/07/2021   Chest pain 05/11/2021   Hyperlipidemia associated with type 2 diabetes mellitus ([place]) 01/15/2021   Hypertension associated with type 2 diabetes mellitus ([place]) 01/15/2021   Nonobstructive atherosclerosis of coronary artery 11/26/2020   Urinary incontinence 10/23/2020   Chronic respiratory failure with hypoxia ([place]) 06/14/2020   Constipation 04/03/2020   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[place]) 04/11/2019   Type 2 diabetes mellitus with hyperglycemia ([name]) 04/11/2019   S/P transmetatarsal amputation of foot, right ([place]) 01/25/2019   Allergic rhinitis 03/06/2018   Class 3 severe obesity due to excess calories with serious comorbidity and body mass index (BMI) of 50.0 to 59.9 in adult [facility])    Gout of knee, Right 10/22/2016   Vitamin D deficiency 09/05/2015   Recurrent candidiasis of vagina 09/05/2015   Varicose veins of leg with complications 22/29/7989   Restless leg syndrome 10/17/2014   Chronic sinusitis 07/18/2014   Insomnia 08/14/2013   Encounter for chronic pain management 09/01/2011   Obesity hypoventilation syndrome ([place]) 05/22/2008   Mood disorder ([place]) 05/22/2008   Obstructive sleep apnea (adult) (pediatric) 05/22/2008   Hypertension 05/22/2008   Asthma, chronic 05/22/2008   GERD 05/22/2008   Type 2 diabetes mellitus with diabetic neuropathy, unspecified ([place]) 05/22/2008   Home Medication(s) Prior to Admission medications   Medication Sig Start Date End Date Taking? Authorizing Provider  molnupiravir EUA (LAGEVRIO) 200 mg CAPS capsule Take 4 capsules (800 mg total) by mouth 2 (two) times daily for 5 days. 12/09/22 12/14/22 Yes Cristie Hem, MD  acetaminophen (TYLENOL) 325 MG tablet Take 1-2 tablets (325-650 mg total) by mouth every 4 (four) hours as needed for mild pain. 06/04/22   Setzer, Edman Circle, PA-C  amLODipine (NORVASC) 10 MG tablet Take 1 tablet (10 mg total) by mouth daily. 09/27/22   [name], MD  aspirin EC 81 MG tablet Take 1 tablet (81 mg total) by mouth daily. Swallow whole. 11/19/20   Sherren Mocha, MD  atropine 1 % ophthalmic solution Place 1 drop into the right eye daily.    [provider]  Brinzolamide-Brimonidine [facility]) 1-0.2 % SUSP Place 1 drop into the left eye in the morning, at noon, and at bedtime.    [provider]  cetirizine (ZYRTEC) 10 MG tablet Take 1 tablet (10 mg total) by mouth daily as needed for allergies. 02/11/22   [name], MD  Continuous Blood Gluc Sensor (DEXCOM G6 SENSOR) MISC Inject 1 applicator into the skin as directed. Change sensor every 10 days. 11/05/21   [name], MD  Continuous Blood Gluc Transmit (DEXCOM G6 TRANSMITTER) MISC Inject 1 Device into the skin as directed. Reuse 8 times with sensor changes. 09/03/21   [name], MD  DULoxetine (CYMBALTA) 60 MG capsule TAKE 2 CAPSULES BY MOUTH DAILY 09/15/22   Lenoria Chime, MD  gabapentin (NEURONTIN) 600 MG tablet TAKE 2 TABLETS  BY MOUTH 3 TIMES DAILY Patient taking differently: Take 1,200 mg by mouth 3 (three) times daily. 11/19/22   [name], MD  GNP ULTICARE PEN NEEDLES 32G X 4 MM MISC USE TO inject insulin 2 TIMES DAILY 10/02/21   [name], MD  insulin aspart (NOVOLOG FLEXPEN) 100 UNIT/ML FlexPen Inject 20 Units into the skin 3 (three) times daily with meals. Patient taking differently: Inject 30 Units into the skin 3 (three) times daily with meals. 05/25/22   Alcus Dad, MD  insulin degludec (TRESIBA FLEXTOUCH) 200 UNIT/ML FlexTouch Pen Inject 52 Units into the skin 2 (two) times daily. Patient taking differently: Inject 60 Units into the skin 2 (two) times daily. 06/04/22   Setzer, Edman Circle, PA-C  Insulin Disposable Pump (OMNIPOD 5 G6 POD, GEN 5,) MISC Inject into the skin daily. 07/30/22   [provider]  ipratropium-albuterol (DUONEB) 0.5-2.5 (3) MG/3ML SOLN Take 3 mLs by nebulization every  6 (six) hours as needed. Patient taking differently: Take 3 mLs by nebulization every 6 (six) hours as needed (SOB). 09/24/22   Spero Geralds, MD  nitroGLYCERIN (NITROSTAT) 0.4 MG SL tablet Place 1 tablet (0.4 mg total) under the tongue every 5 (five) minutes as needed for chest pain. 11/04/22   [name], MD  nystatin (MYCOSTATIN/NYSTOP) powder Apply 1 application. topically 3 (three) times daily. Patient not taking: Reported on 11/28/2022 03/30/22   [name], MD  ondansetron [facility]) 4 MG tablet Take 1 tablet (4 mg total) by mouth every 8 (eight) hours as needed for nausea or vomiting. 02/17/22   Lovorn, Jinny Blossom, MD  oxyCODONE-acetaminophen (PERCOCET/ROXICET) 5-325 MG tablet Take 1 tablet by mouth daily as needed for severe pain. Patient not taking: Reported on 11/28/2022 11/09/22   [name], MD  pantoprazole (PROTONIX) 40 MG tablet Take 1 tablet (40 mg total) by mouth daily. 09/27/22   [name], MD  polyethylene glycol powder [facility]) 17 GM/SCOOP powder Take 17 g by mouth daily as needed (constipation). 09/27/22   [name], MD  PROAIR HFA [phone] Base) MCG/ACT inhaler INHALE 1 TO 2 PUFFS BY MOUTH EVERY 6 HOURS AS NEEDED FOR WHEEZING OR SHORTNESS OF BREATH Patient taking differently: Inhale 2 puffs into the lungs every 6 (six) hours as needed for wheezing or shortness of breath. 10/05/22   [name], MD  rOPINIRole (REQUIP) 1 MG tablet Take 1 mg by mouth at bedtime.    [provider]  rosuvastatin (CRESTOR) 40 MG tablet Take 1 tablet (40 mg total) by mouth daily. 11/04/22   [name], MD  SYMBICORT 160-4.5 MCG/ACT inhaler INHALE 2 PUFFS into THE lungs 2 TIMES DAILY Patient taking differently: Inhale 2 puffs into the lungs 2 (two) times daily. 09/24/22   [name], MD  torsemide (DEMADEX) 20 MG tablet Take 4 tablets (80 mg total) by mouth 2 (two) times daily. Patient taking differently: Take 20 mg by  mouth See admin instructions. Pt states she takes '100mg'$  in the AM and '80mg'$  in the PM 09/02/22   Sherren Mocha, MD  traZODone (DESYREL) 100 MG tablet TAKE 1 TABLET BY MOUTH AT BEDTIME AS NEEDED FOR SLEEP Patient taking differently: Take 100 mg by mouth at bedtime as needed for sleep. for sleep 09/15/22   Lenoria Chime, MD  Vitamin D, Cholecalciferol, 25 MCG (1000 UT) CAPS Take 1,000 Units by mouth daily. 10/05/22   [name], MD  Past Surgical History Past Surgical History:  Procedure Laterality Date   AMPUTATION Left 02/01/2018   Procedure: LEFT FOURTH AND 5TH TOE RAY AMPUTATION;  Surgeon: [name], MD;  Location: [place];  Service: Orthopedics;  Laterality: Left;   AMPUTATION Left 03/03/2018   Procedure: LEFT BELOW KNEE AMPUTATION;  Surgeon: [name], MD;  Location: [place];  Service: Orthopedics;  Laterality: Left;   AMPUTATION Right 05/21/2022   Procedure: REVISION AMPUTATION RAY 1ST;  Surgeon: [name], DPM;  Location: [place];  Service: Podiatry;  Laterality: Right;   CARPAL TUNNEL RELEASE Bilateral    CESAREAN SECTION  2007   CORONARY ANGIOGRAPHY N/A 11/26/2020   Procedure: CORONARY ANGIOGRAPHY;  Surgeon: Sherren Mocha, MD;  Location: [place] CV [facility];  Service: Cardiovascular;  Laterality: N/A;   FOOT AMPUTATION Right    INCISION AND DRAINAGE ABSCESS Left 10/16/2021   Procedure: INCISION AND DRAINAGE ABSCESS;  Surgeon: Dwan Bolt, MD;  Location: [place];  Service: General;  Laterality: Left;   INCISION AND DRAINAGE PERIRECTAL ABSCESS Left 05/18/2019   Procedure: IRRIGATION AND DEBRIDEMENT OF PANNIS ABSCESS, POSSIBLE DEBRIDEMENT OF BUTTOCK WOUND;  Surgeon: Donnie Mesa, MD;  Location: [place];  Service: General;  Laterality: Left;   IRRIGATION AND DEBRIDEMENT BUTTOCKS Left 05/17/2019   Procedure: IRRIGATION AND DEBRIDEMENT  BUTTOCKS;  Surgeon: Donnie Mesa, MD;  Location: [place];  Service: General;  Laterality: Left;   KNEE ARTHROSCOPY Right 07/17/2010   LEFT HEART CATHETERIZATION WITH CORONARY ANGIOGRAM N/A 07/27/2012   Procedure: LEFT HEART CATHETERIZATION WITH CORONARY ANGIOGRAM;  Surgeon: Sherren Mocha, MD;  Location: [facility] CATH LAB;  Service: Cardiovascular;  Laterality: N/A;   MASS EXCISION N/A 06/29/2013   Procedure:  WIDE LOCAL EXCISION OF POSTERIOR NECK ABSCESS;  Surgeon: Ralene Ok, MD;  Location: [place];  Service: General;  Laterality: N/A;   REPAIR KNEE LIGAMENT Left    "fixed ligaments and chipped patella"   right transmetatarsal amputation      TRACHEOSTOMY TUBE PLACEMENT N/A 07/10/2022   Procedure: CONTROLLED EXTUBATION;  Surgeon: Izora Gala, MD;  Location: [facility] OR;  Service: ENT;  Laterality: N/A;   Family History Family History  Problem Relation Age of Onset   Diabetes Mother    Hyperlipidemia Mother    Depression Mother    GER disease Mother    Allergic rhinitis Mother    Restless legs syndrome Mother    Heart attack Paternal Uncle    Heart disease Paternal Grandmother    Heart attack Paternal Grandmother    Heart attack Paternal Grandfather    Heart disease Paternal Grandfather    Heart attack Father    Migraines Sister    Cancer Maternal Grandmother        COLON   Hypertension Maternal Grandmother    Hyperlipidemia Maternal Grandmother    Diabetes Maternal Grandmother    Other Maternal Grandfather        GUN SHOT   Anxiety disorder Sister    Asthma Child     Social History Social History   Tobacco Use   Smoking status: Former    Packs/day: 0.25    Years: 15.00    Total pack years: 3.75    Types: Cigarettes    Quit date: 12/06/2005    Years since quitting: 17.0   Smokeless tobacco: Never   Tobacco comments:    smokes for a couple of months  Vaping Use   Vaping Use: Never used  Substance Use Topics   Alcohol use:  No    Alcohol/week: 0.0 standard drinks of alcohol    Drug use: Not Currently    Comment: OD attempts on home meds     Allergies Cefepime, Iodine, Kiwi extract, Morphine and related, Nalbuphine, Pentoxifylline, Cephalosporins, Toradol [ketorolac tromethamine], Morphine, and Nubain [nalbuphine hcl]  Review of Systems Review of Systems  All other systems reviewed and are negative.   Physical Exam Vital Signs  I have reviewed the triage vital signs BP (!) 145/90   Pulse 87   Temp 98 F (36.7 C)   Resp 18   SpO2 90%  Physical Exam Vitals and nursing note reviewed.  Constitutional:      General: She is not in acute distress.    Appearance: She is well-developed. She is obese.  HENT:     Head: Normocephalic and atraumatic.     Mouth/Throat:     Mouth: Mucous membranes are moist.  Eyes:     Pupils: Pupils are equal, round, and reactive to light.  Cardiovascular:     Rate and Rhythm: Normal rate and regular rhythm.     Heart sounds: No murmur heard. Pulmonary:     Effort: Pulmonary effort is normal. No respiratory distress.     Breath sounds: Normal breath sounds.  Abdominal:     General: Abdomen is flat.     Palpations: Abdomen is soft.     Tenderness: There is no abdominal tenderness.  Musculoskeletal:        General: No tenderness.     Comments: Bilateral lower extremity amputations, trace bilateral lower extremity edema  Skin:    General: Skin is warm and dry.  Neurological:     General: No focal deficit present.     Mental Status: She is alert. Mental status is at baseline.  Psychiatric:        Mood and Affect: Mood normal.        Behavior: Behavior normal.     ED Results and Treatments Labs (all labs ordered are listed, but only abnormal results are displayed) Labs Reviewed  RESP PANEL BY RT-PCR (RSV, FLU A&B, COVID)  RVPGX2 - Abnormal; Notable for the following components:      Result Value   SARS Coronavirus 2 by RT PCR POSITIVE (*)    All other components within normal limits  CBC - Abnormal; Notable  for the following components:   RBC 3.70 (*)    Hemoglobin 10.5 (*)    HCT 34.1 (*)    RDW 15.6 (*)    All other components within normal limits  COMPREHENSIVE METABOLIC PANEL - Abnormal; Notable for the following components:   Chloride 94 (*)    CO2 36 (*)    Glucose, Bld 126 (*)    BUN 52 (*)    Creatinine, Ser 1.30 (*)    Calcium 8.5 (*)    Albumin 3.0 (*)    GFR, Estimated 52 (*)    All other components within normal limits  D-DIMER, QUANTITATIVE - Abnormal; Notable for the following components:   D-Dimer, Quant 0.55 (*)    All other components within normal limits  BRAIN NATRIURETIC PEPTIDE  I-STAT BETA HCG BLOOD, ED ([facility], [facility], [facility] ONLY)  TROPONIN I (HIGH SENSITIVITY)  TROPONIN I (HIGH SENSITIVITY)  Radiology CT Angio Chest PE W/Cm &/Or Wo Cm  Result Date: 12/09/2022 CLINICAL DATA:  Positive D-dimer. EXAM: CT ANGIOGRAPHY CHEST WITH CONTRAST TECHNIQUE: Multidetector CT imaging of the chest was performed using the standard protocol during bolus administration of intravenous contrast. Multiplanar CT image reconstructions and MIPs were obtained to evaluate the vascular anatomy. RADIATION DOSE REDUCTION: This exam was performed according to the departmental dose-optimization program which includes automated exposure control, adjustment of the mA and/or kV according to patient size and/or use of iterative reconstruction technique. CONTRAST:  59m OMNIPAQUE IOHEXOL 350 MG/ML SOLN COMPARISON:  Chest radiograph 1 day prior.  CTA chest 04/11/2022 FINDINGS: Cardiovascular: Evaluation of the pulmonary vasculature is suboptimal due to body habitus and bolus timing. There is no large central pulmonary embolism. The pulmonary arteries beyond the lobar level can not be assessed. The main pulmonary artery is enlarged measuring up to 3.7 cm. The heart size is normal. The thoracic aorta  is normal. There is no pericardial effusion. Coronary artery calcifications, advanced for age. Mediastinum/Nodes: The imaged thyroid is unremarkable. The esophagus is grossly unremarkable. There are prominent mediastinal lymph nodes measuring up to 1.4 cm in the AP window and 1.5 cm in the right suprahilar region, new since 04/11/2022. There are prominent axillary lymph nodes which maintain normal reniform morphology and fatty hila, unchanged. Lungs/Pleura: The trachea is patent. There is significant narrowing of the left main bronchus there is central bronchial wall thickening. There is no focal consolidation or pulmonary edema. Mosaic attenuation in the lungs suggests air trapping/small airway disease. There is no pleural effusion or pneumothorax There are no suspicious nodules. Upper Abdomen: The benign right adrenal adenoma is unchanged requiring no specific imaging follow-up. Musculoskeletal: There is no acute osseous abnormality or suspicious osseous lesion. Review of the MIP images confirms the above findings. IMPRESSION: 1. Limited evaluation of the pulmonary vasculature due to body habitus and bolus timing. No large central pulmonary embolism. The pulmonary arteries beyond the lobar level can not be assessed. 2. Enlarged main pulmonary artery suggesting pulmonary arterial hypertension. 3. Central bronchial wall thickening may reflect bronchitis with narrowing of the left main bronchus. Mosaic attenuation in the lungs suggesting air trapping/small airway disease. 4. Enlarged mediastinal lymph nodes are nonspecific and may be reactive. Recommend follow-up chest CT, preferably with contrast, in approximately 3 months to assess for resolution. 5. Age advanced coronary artery calcifications. Electronically Signed   By: PValetta MoleM.D.   On: 12/09/2022 11:01   DG Chest 1 View  Result Date: 12/08/2022 CLINICAL DATA:  Chest pain EXAM: CHEST  1 VIEW COMPARISON:  Previous studies including the chest radiograph  done on 11/28/2022 and CT chest done on 04/11/2022 FINDINGS: Transverse diameter of heart is increased. There is poor inspiration. Central pulmonary vessels are prominent. There are no signs of alveolar pulmonary edema or new focal pulmonary consolidation. Examination is technically limited due to patient's body habitus and poor inspiration. IMPRESSION: Cardiomegaly. Central pulmonary vessels are prominent without signs of alveolar pulmonary edema. No new focal pulmonary consolidation is seen. Electronically Signed   By: PElmer PickerM.D.   On: 12/08/2022 18:02    Pertinent labs & imaging results that were available during my care of the patient were reviewed by me and considered in my medical decision making (see MDM for details).  Medications Ordered in ED Medications  iohexol (OMNIPAQUE) 350 MG/ML injection 75 mL (75 mLs Intravenous Contrast Given 12/09/22 1037)  Procedures Procedures  (including critical care time)  Medical Decision Making / ED Course   MDM:  47 year old female presenting to the emergency department with chest pain.  Patient well-appearing, physical exam with obese female with bilateral lower extremity amputations with trace edema.  Lungs clear.  Patient not hypoxic on home oxygen.  Workup notable for positive COVID test.  Suspect this explains the patient's symptoms.  EKG at baseline and troponin negative, prior cardiac cath with no obstructive disease, doubt ACS.  Lower concern for PE but D-dimer was obtained and positive, patient has been recently hospitalized so will need further evaluation.  Chest x-ray without evidence of pneumonia, pneumothorax.  Doubt esophageal pathology without nausea or vomiting.  If PE workup is negative likely discharged with antiviral therapy.  Clinical Course as of 12/09/22 1114  Thu Dec 09, 2022  1111  CTA chest negative for pulmonary embolism although significantly limited by body habitus and contrast timing.  D-dimer was only minimally elevated, and symptoms atypical for pulmonary embolism with no pleuritic nature of the pain, patient also without tachycardia and no hypoxia on typical oxygen.  Suspect symptoms all related to COVID-19 infection. Will discharge patient to home. All questions answered. Patient comfortable with plan of discharge. Return precautions discussed with patient and specified on the after visit summary.  [WS]    Clinical Course User Index [WS] Cristie Hem, MD     Additional history obtained: -Additional history obtained from ems -External records from outside source obtained and reviewed including: Chart review including previous notes, labs, imaging, consultation notes including DC summary from recent hospitalization 11/28/22   Lab Tests: -I ordered, reviewed, and interpreted labs.   The pertinent results include:   Labs Reviewed  RESP PANEL BY RT-PCR (RSV, FLU A&B, COVID)  RVPGX2 - Abnormal; Notable for the following components:      Result Value   SARS Coronavirus 2 by RT PCR POSITIVE (*)    All other components within normal limits  CBC - Abnormal; Notable for the following components:   RBC 3.70 (*)    Hemoglobin 10.5 (*)    HCT 34.1 (*)    RDW 15.6 (*)    All other components within normal limits  COMPREHENSIVE METABOLIC PANEL - Abnormal; Notable for the following components:   Chloride 94 (*)    CO2 36 (*)    Glucose, Bld 126 (*)    BUN 52 (*)    Creatinine, Ser 1.30 (*)    Calcium 8.5 (*)    Albumin 3.0 (*)    GFR, Estimated 52 (*)    All other components within normal limits  D-DIMER, QUANTITATIVE - Abnormal; Notable for the following components:   D-Dimer, Quant 0.55 (*)    All other components within normal limits  BRAIN NATRIURETIC PEPTIDE  I-STAT BETA HCG BLOOD, ED ([facility], [facility], [facility] ONLY)  TROPONIN I (HIGH SENSITIVITY)  TROPONIN I  (HIGH SENSITIVITY)    Notable for normal BNP, troponin, CKD, COVID-19  EKG   EKG Interpretation  Date/Time:  Wednesday December 08 2022 16:26:15 EST Ventricular Rate:  85 PR Interval:  160 QRS Duration: 100 QT Interval:  400 QTC Calculation: 476 R Axis:   65 Text Interpretation: Normal sinus rhythm Low voltage QRS Cannot rule out Anterior infarct , age undetermined Abnormal ECG When compared with ECG of 28-Nov-2022 03:51, No significant change since last tracing Confirmed by Garnette Gunner [phone]) on 12/09/2022 9:22:23 AM         Imaging Studies ordered:  I ordered imaging studies including CTA chest On my interpretation imaging demonstrates limited eval, but no large PE I independently visualized and interpreted imaging. I agree with the radiologist interpretation   Medicines ordered and prescription drug management: Meds ordered this encounter  Medications   iohexol (OMNIPAQUE) 350 MG/ML injection 75 mL   molnupiravir EUA (LAGEVRIO) 200 mg CAPS capsule    Sig: Take 4 capsules (800 mg total) by mouth 2 (two) times daily for 5 days.    Dispense:  40 capsule    Refill:  0    -I have reviewed the patients home medicines and have made adjustments as needed   Cardiac Monitoring: The patient was maintained on a cardiac monitor.  I personally viewed and interpreted the cardiac monitored which showed an underlying rhythm of: NSR  Social Determinants of Health:  Diagnosis or treatment significantly limited by social determinants of health: obesity   Reevaluation: After the interventions noted above, I reevaluated the patient and found that they have improved  Co morbidities that complicate the patient evaluation  Past Medical History:  Diagnosis Date   Abscess of groin, left    Acute on chronic diastolic (congestive) heart failure ([place]) 02/05/2022   Acute on chronic respiratory failure with hypoxia ([place]) 04/11/2022   Acute osteomyelitis of ankle or foot, left ([place])  01/31/2018   Acute respiratory failure with hypoxia and hypercarbia ([place]) 06/15/2020   Acute tubular necrosis ([place]) 07/12/2022   Alveolar hypoventilation    Amputation stump infection ([place]) 04/09/2018   Anemia    not on iron pill   Asthma    Bipolar 2 disorder ([name])    Candidiasis of vagina 05/15/2022   Carpal tunnel syndrome on right    recurrent   Cellulitis 08/2010-08/2011   Chest pain with low risk for cardiac etiology 05/11/2021   Chronic pain    COPD (chronic obstructive pulmonary disease) ([name])    Symbicort daily and Proventil as needed   Costochondritis    De Quervain's tenosynovitis, bilateral 11/01/2015   Depression    Diabetes mellitus type II, uncontrolled 2000   Type 2, Uncontrolled.Takes Lantus daily.Fasting blood sugar runs 150   Diabetic ulcer of right foot ([place]) 12/19/2021   Drug-seeking behavior    Elevated troponin    Exposure to mold 04/21/2020   GERD (gastroesophageal reflux disease)    takes Pantoprazole and Zantac daily   HLD (hyperlipidemia)    takes Atorvastatin daily   Hypertension    takes Lisinopril and Coreg daily   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[place]) 04/11/2019   Left shoulder pain 06/23/2019   Ludwig's angina 07/05/2022   Morbid obesity ([place])    Morbid obesity with BMI of 60.0-69.9, adult ([place]) 04/15/2021   Nocturia    Nonobstructive atherosclerosis of coronary artery 11/26/2020   11/26/20 Coronary angiogram: 1.  Mild diffuse proximal LAD stenosis with no evidence of obstructive disease (20 to 30% diffuse stenosis);  2.  Left dominant circumflex with widely patent obtuse marginal branches and left PDA branch, no significant stenoses present  3.  Nondominant RCA with mild diffuse nonobstructive stenosis    OSA on CPAP    Peripheral neuropathy    takes Gabapentin daily   Pneumonia    "walking" several yrs ago and as a baby (12/05/2018)   Pneumonia due to COVID-19 virus 04/14/2021   Primary osteoarthritis of first carpometacarpal joint of left hand  07/30/2016   Rectal fissure    Restless leg    Right carpal tunnel syndrome 09/01/2011  SVT (supraventricular tachycardia)    Syncope 02/25/2016   Thrombocytosis 04/11/2022   Urinary frequency    Urinary incontinence 10/23/2020   UTI (urinary tract infection) 04/15/2021   Varicose veins    Right medial thigh and Left leg       Dispostion: Disposition decision including need for hospitalization was considered, and patient discharged from emergency department.    Final Clinical Impression(s) / ED Diagnoses Final diagnoses:  COVID-19     This chart was dictated using voice recognition software.  Despite best efforts to proofread,  errors can occur which can change the documentation meaning.    Cristie Hem, MD 12/09/22 1114

## 2022-12-09 NOTE — Discharge Instructions (Addendum)
We evaluated you for your chest pain and shortness of breath.  Your cardiac enzymes were normal and you are not retaining fluid.  We did not see signs of a pneumonia or blood clot in your lung.  We diagnosed you with COVID-19.  This is likely the cause of your symptoms.  I prescribed you an antiviral which you should take for the next 5 days.  This can help reduce the risk that you end up needing hospitalization and can reduce the length of symptoms.  Please take 1000 mg of Tylenol every 6 hours as needed for fevers or chills or other pain.  Please follow-up closely with your primary doctor.  Please return to the emergency department if you develop any increased difficulty breathing, worsening chest pain, productive cough, abdominal pain, or any other concerning symptoms.

## 2022-12-09 NOTE — ED Notes (Signed)
Patient waiting on [facility] for transport home.

## 2022-12-09 NOTE — ED Notes (Signed)
Pericare provided, linens changed.

## 2022-12-13 ENCOUNTER — Other Ambulatory Visit: Payer: Self-pay

## 2022-12-13 MED ORDER — TRAZODONE HCL 100 MG PO TABS: 100.0000 mg | ORAL_TABLET | Freq: Every evening | ORAL | 1 refills | Status: DC | PRN

## 2022-12-13 MED ORDER — DULOXETINE HCL 60 MG PO CPEP: 120.0000 mg | ORAL_CAPSULE | Freq: Every day | ORAL | 1 refills | Status: DC

## 2022-12-14 DIAGNOSIS — Z89512 Acquired absence of left leg below knee: Secondary | ICD-10-CM | POA: Diagnosis not present

## 2022-12-14 DIAGNOSIS — E119 Type 2 diabetes mellitus without complications: Secondary | ICD-10-CM | POA: Diagnosis not present

## 2022-12-14 DIAGNOSIS — R32 Unspecified urinary incontinence: Secondary | ICD-10-CM | POA: Diagnosis not present

## 2022-12-16 ENCOUNTER — Encounter: Payer: Self-pay | Admitting: Family Medicine

## 2022-12-16 ENCOUNTER — Ambulatory Visit (INDEPENDENT_AMBULATORY_CARE_PROVIDER_SITE_OTHER): Payer: 59 | Admitting: Family Medicine

## 2022-12-16 VITALS — BP 102/60 | HR 90 | Ht 62.0 in

## 2022-12-16 DIAGNOSIS — G8929 Other chronic pain: Secondary | ICD-10-CM | POA: Diagnosis not present

## 2022-12-16 DIAGNOSIS — F1490 Cocaine use, unspecified, uncomplicated: Secondary | ICD-10-CM | POA: Diagnosis not present

## 2022-12-16 DIAGNOSIS — U071 COVID-19: Secondary | ICD-10-CM

## 2022-12-16 MED ORDER — DEXAMETHASONE 6 MG PO TABS: 6.0000 mg | ORAL_TABLET | Freq: Every day | ORAL | 0 refills | Status: DC

## 2022-12-16 MED ORDER — ROPINIROLE HCL 1 MG PO TABS: 1.0000 mg | ORAL_TABLET | Freq: Every day | ORAL | 1 refills | Status: DC

## 2022-12-16 NOTE — Patient Instructions (Addendum)
Let me know if you want to see a substance abuse counselor Keep monitoring your oxygen levels at home Sent in dexamethasone '6mg'$  daily for you - take for one week If any worsening please go to the ER  Be well, Dr. [name]

## 2022-12-16 NOTE — Progress Notes (Signed)
Date of Visit: 12/16/2022   SUBJECTIVE:   HPI:  Kassia presents today for follow-up.  COVID: Was seen at the emergency room on January 3 and diagnosed with COVID.  Given a course of molnupiravir, which she has completed.  She continues to feel poorly.  Started to feel little bit better the other day, but in the last 24 hours this felt worse.  She is requiring 3 L of oxygen to keep her saturations high, otherwise she dips quite low.  Eating and drinking well, normal urine output.  Sugars have been doing well, yesterday fasting sugar was in the 160s.  This is quite good for her.  Denies fever.  Feels very fatigued.  Cocaine use: While she was admitted to the hospital in late December had a urine drug screen that was positive for cocaine.  Patient admits to using cocaine when she is in pain.  She understands that I can no longer prescribe pain medication due to her repeated cocaine use.  She is not interested in meeting with the substance abuse counselor at this time, but is open to doing it in the future.  Left hand stiffness: Has noted that her left hand has been stiff with waking for the last several weeks to months.  Last several minutes before she can really open it up and move her fingers around.  No preceding injury.  Has never been tested for rheumatoid arthritis in the past.  OBJECTIVE:   BP 102/60   Pulse 90   Ht '5\' 2"'$  (1.575 m)   LMP  (LMP Unknown)   SpO2 96%   BMI 63.59 kg/m  Gen: No acute distress, cooperative, appears fatigued HEENT: Normocephalic, atraumatic, nasal cannula in place Heart: Regular rate and rhythm, no murmur Lungs: Clear bilaterally, normal effort, speaks in full sentences without distress Neuro: Alert, grossly nonfocal, speech normal Ext: Left hand without any obvious abnormalities.  No erythema, swelling, or tenderness.  ASSESSMENT/PLAN:   COVID Had been doing better during course of molnupiravir, but reports feeling worse over the last 24 hours.   Discussed with patient that she could have a superimposed bacterial pneumonia, or a PE, and the only way to know is to get more imaging or go to the emergency room.  She does not want to do either of those.  Given lack of fever, I think bacterial pneumonia is somewhat less likely.  She does require oxygen intermittently at baseline, it seems her greatest change from baseline at present is significant fatigue.  I think a course of dexamethasone would be reasonable at this point to try to help her feel better and treat her COVID.  Sent in dexamethasone 6 mg daily for 7 days.  She will monitor her sugars closely while on this.  She knows to go to the emergency room if she worsens in any way.  Left hand stiffness Once she is feeling better, will plan to obtain labs to assess for rheumatoid arthritis.  Defer this for now.  Scheduled follow-up for February 8.  Cocaine use Recurrent cocaine use.  Offered substance abuse counseling, which she may be interested in in the future.  Will continue to touch base about this.  Encounter for chronic pain management Unfortunately it is unsafe to continue prescribing narcotics given her repeated cocaine use.  She is aware I am not going to be prescribing any opioids.  I did refill her Cymbalta recently, and will send in a refill of her ropinirole for restless legs.  FOLLOW UP:  Follow up in 1 month as scheduled for above issues  [name], [place]

## 2022-12-18 NOTE — Assessment & Plan Note (Signed)
Unfortunately it is unsafe to continue prescribing narcotics given her repeated cocaine use.  She is aware I am not going to be prescribing any opioids.  I did refill her Cymbalta recently, and will send in a refill of her ropinirole for restless legs.

## 2022-12-18 NOTE — Assessment & Plan Note (Signed)
Recurrent cocaine use.  Offered substance abuse counseling, which she may be interested in in the future.  Will continue to touch base about this.

## 2022-12-19 NOTE — Therapy (Deleted)
OUTPATIENT OCCUPATIONAL THERAPY NEURO EVALUATION  Patient Name: [name] MRN: [id number] DOB:June 05, 1976, 47 y.o., female Today's Date: 12/19/2022  PCP: Marland Kitchen REFERRING PROVIDER: ***  END OF SESSION:   Past Medical History:  Diagnosis Date   Abscess of groin, left    Acute on chronic diastolic (congestive) heart failure ([place]) 02/05/2022   Acute on chronic respiratory failure with hypoxia ([name]) 04/11/2022   Acute osteomyelitis of ankle or foot, left ([name]) 01/31/2018   Acute respiratory failure with hypoxia and hypercarbia ([name]) 06/15/2020   Acute tubular necrosis ([name]) 07/12/2022   Alveolar hypoventilation    Amputation stump infection ([place]) 04/09/2018   Anemia    not on iron pill   Asthma    Bipolar 2 disorder ([name])    Candidiasis of vagina 05/15/2022   Carpal tunnel syndrome on right    recurrent   Cellulitis 08/2010-08/2011   Chest pain with low risk for cardiac etiology 05/11/2021   Chronic pain    COPD (chronic obstructive pulmonary disease) ([name])    Symbicort daily and Proventil as needed   Costochondritis    De Quervain's tenosynovitis, bilateral 11/01/2015   Depression    Diabetes mellitus type II, uncontrolled 2000   Type 2, Uncontrolled.Takes Lantus daily.Fasting blood sugar runs 150   Diabetic ulcer of right foot ([place]) 12/19/2021   Drug-seeking behavior    Elevated troponin    Exposure to mold 04/21/2020   GERD (gastroesophageal reflux disease)    takes Pantoprazole and Zantac daily   HLD (hyperlipidemia)    takes Atorvastatin daily   Hypertension    takes Lisinopril and Coreg daily   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[place]) 04/11/2019   Left shoulder pain 06/23/2019   Ludwig's angina 07/05/2022   Morbid obesity ([place])    Morbid obesity with BMI of 60.0-69.9, adult ([place]) 04/15/2021   Nocturia    Nonobstructive atherosclerosis of coronary artery 11/26/2020   11/26/20 Coronary angiogram: 1.  Mild diffuse proximal LAD stenosis with no evidence of obstructive disease  (20 to 30% diffuse stenosis);  2.  Left dominant circumflex with widely patent obtuse marginal branches and left PDA branch, no significant stenoses present  3.  Nondominant RCA with mild diffuse nonobstructive stenosis    OSA on CPAP    Peripheral neuropathy    takes Gabapentin daily   Pneumonia    "walking" several yrs ago and as a baby (12/05/2018)   Pneumonia due to COVID-19 virus 04/14/2021   Primary osteoarthritis of first carpometacarpal joint of left hand 07/30/2016   Rectal fissure    Restless leg    Right carpal tunnel syndrome 09/01/2011   SVT (supraventricular tachycardia)    Syncope 02/25/2016   Thrombocytosis 04/11/2022   Urinary frequency    Urinary incontinence 10/23/2020   UTI (urinary tract infection) 04/15/2021   Varicose veins    Right medial thigh and Left leg    Past Surgical History:  Procedure Laterality Date   AMPUTATION Left 02/01/2018   Procedure: LEFT FOURTH AND 5TH TOE RAY AMPUTATION;  Surgeon: [name], MD;  Location: [facility];  Service: Orthopedics;  Laterality: Left;   AMPUTATION Left 03/03/2018   Procedure: LEFT BELOW KNEE AMPUTATION;  Surgeon: [name], MD;  Location: [facility];  Service: Orthopedics;  Laterality: Left;   AMPUTATION Right 05/21/2022   Procedure: REVISION AMPUTATION RAY 1ST;  Surgeon: [name], DPM;  Location: [facility];  Service: Podiatry;  Laterality: Right;   CARPAL TUNNEL RELEASE Bilateral    CESAREAN SECTION  2007  CORONARY ANGIOGRAPHY N/A 11/26/2020   Procedure: CORONARY ANGIOGRAPHY;  Surgeon: Sherren Mocha, MD;  Location: [facility];  Service: Cardiovascular;  Laterality: N/A;   FOOT AMPUTATION Right    INCISION AND DRAINAGE ABSCESS Left 10/16/2021   Procedure: INCISION AND DRAINAGE ABSCESS;  Surgeon: Dwan Bolt, MD;  Location: [facility];  Service: General;  Laterality: Left;   INCISION AND DRAINAGE PERIRECTAL ABSCESS Left 05/18/2019   Procedure: IRRIGATION AND DEBRIDEMENT OF PANNIS ABSCESS, POSSIBLE  DEBRIDEMENT OF BUTTOCK WOUND;  Surgeon: Donnie Mesa, MD;  Location: [facility];  Service: General;  Laterality: Left;   IRRIGATION AND DEBRIDEMENT BUTTOCKS Left 05/17/2019   Procedure: IRRIGATION AND DEBRIDEMENT BUTTOCKS;  Surgeon: Donnie Mesa, MD;  Location: [facility];  Service: General;  Laterality: Left;   KNEE ARTHROSCOPY Right 07/17/2010   LEFT HEART CATHETERIZATION WITH CORONARY ANGIOGRAM N/A 07/27/2012   Procedure: LEFT HEART CATHETERIZATION WITH CORONARY ANGIOGRAM;  Surgeon: Sherren Mocha, MD;  Location: [facility];  Service: Cardiovascular;  Laterality: N/A;   MASS EXCISION N/A 06/29/2013   Procedure:  WIDE LOCAL EXCISION OF POSTERIOR NECK ABSCESS;  Surgeon: Ralene Ok, MD;  Location: [facility];  Service: General;  Laterality: N/A;   REPAIR KNEE LIGAMENT Left    "fixed ligaments and chipped patella"   right transmetatarsal amputation      TRACHEOSTOMY TUBE PLACEMENT N/A 07/10/2022   Procedure: CONTROLLED EXTUBATION;  Surgeon: Izora Gala, MD;  Location: [facility];  Service: ENT;  Laterality: N/A;   Patient Active Problem List   Diagnosis Date Noted   Acute exacerbation of CHF (congestive heart failure) ([place]) 11/28/2022   Cocaine use 11/05/2022   Cellulitis of right lower extremity 06/10/2022   Peripheral artery disease, right leg, mild([name]) 05/15/2022   Osteomyelitis of foot, right, acute ([place]) 05/14/2022   Wound infection 04/09/2022   Adrenal nodule ([place]) 01/07/2022   Iritis    Chronic kidney disease, stage 3a ([place]) [id number]   Diastolic HF (heart failure) ([place]) 10/19/2021   Impaired mobility 07/07/2021   Chest pain 05/11/2021   Hyperlipidemia associated with type 2 diabetes mellitus ([place]) 01/15/2021   Hypertension associated with type 2 diabetes mellitus ([place]) 01/15/2021   Nonobstructive atherosclerosis of coronary artery 11/26/2020   Urinary incontinence 10/23/2020   Chronic respiratory failure with hypoxia ([place]) 06/14/2020   Constipation 04/03/2020   Left below-knee amputee  ([place]) 04/11/2019   Type 2 diabetes mellitus with hyperglycemia ([place]) 04/11/2019   S/P transmetatarsal amputation of foot, right ([place]) 01/25/2019   Allergic rhinitis 03/06/2018   Class 3 severe obesity due to excess calories with serious comorbidity and body mass index (BMI) of 50.0 to 59.9 in adult [facility])    Gout of knee, Right 10/22/2016   Vitamin D deficiency 09/05/2015   Recurrent candidiasis of vagina 09/05/2015   Varicose veins of leg with complications [id number]   Restless leg syndrome 10/17/2014   Chronic sinusitis 07/18/2014   Insomnia 08/14/2013   Encounter for chronic pain management 09/01/2011   Obesity hypoventilation syndrome ([place]) 05/22/2008   Mood disorder ([place]) 05/22/2008   Obstructive sleep apnea (adult) (pediatric) 05/22/2008   Hypertension 05/22/2008   Asthma, chronic 05/22/2008   GERD 05/22/2008   Type 2 diabetes mellitus with diabetic neuropathy, unspecified ([place]) 05/22/2008    ONSET DATE: ***  REFERRING DIAG: ***  THERAPY DIAG:  No diagnosis found.  Rationale for Evaluation and Treatment: {HABREHAB:27488}  SUBJECTIVE:   SUBJECTIVE STATEMENT: *** Pt accompanied by: {accompnied:27141}  PERTINENT HISTORY: ***  PRECAUTIONS: {Therapy precautions:24002} Pt is a  48 y.o. female who presented 11/27/22 with SOB. Chest x-ray reviewed and noted to have increased markings consistent with volume overload. Work-up for CHF exacerbation vs viral illness in setting of obesity hypoventilation syndrome vs cocaine-induced. PMH: type 2 diabetes, morbid obesity, obesity hypoventilation syndrome, OSA wears BiPAP at night, hypertension, asthma, GERD, hyperlipidemia, restless leg syndrome, left BKA, R transmetatarsal amputation, mood disorder, hypertension, history of chest pain Pt was seen in hospital 12/08/22 for covid WEIGHT BEARING RESTRICTIONS: {Yes ***/No:24003}  PAIN:  Are you having pain? {OPRCPAIN:27236}  FALLS: Has patient fallen in last 6 months?  {fallsyesno:27318}  LIVING ENVIRONMENT: Lives with: {OPRC lives with:25569::"lives with their family"} Lives in: {Lives in:25570} Stairs: {opstairs:27293} Has following equipment at home: {Assistive devices:23999}  PLOF: {PLOF:24004}  PATIENT GOALS: ***  OBJECTIVE:   HAND DOMINANCE: {MISC; OT HAND DOMINANCE:214 188 1510}  ADLs: Overall ADLs: *** Transfers/ambulation related to ADLs: Eating: *** Grooming: *** UB Dressing: *** LB Dressing: *** Toileting: *** Bathing: *** Tub Shower transfers: *** Equipment: {equipment:25573}  IADLs: Shopping: *** Light housekeeping: *** Meal Prep: *** Community mobility: *** Medication management: *** Financial management: *** Handwriting: {OTWRITTENEXPRESSION:25361}  MOBILITY STATUS: {OTMOBILITY:25360}  POSTURE COMMENTS:  {posture:25561} Sitting balance: {sitting balance:25483}  ACTIVITY TOLERANCE: Activity tolerance: ***  FUNCTIONAL OUTCOME MEASURES: {OTFUNCTIONALMEASURES:27238}  UPPER EXTREMITY ROM:    {AROM/PROM:27142} ROM Right eval Left eval  Shoulder flexion    Shoulder abduction    Shoulder adduction    Shoulder extension    Shoulder internal rotation    Shoulder external rotation    Elbow flexion    Elbow extension    Wrist flexion    Wrist extension    Wrist ulnar deviation    Wrist radial deviation    Wrist pronation    Wrist supination    (Blank rows = not tested)  UPPER EXTREMITY MMT:     MMT Right eval Left eval  Shoulder flexion    Shoulder abduction    Shoulder adduction    Shoulder extension    Shoulder internal rotation    Shoulder external rotation    Middle trapezius    Lower trapezius    Elbow flexion    Elbow extension    Wrist flexion    Wrist extension    Wrist ulnar deviation    Wrist radial deviation    Wrist pronation    Wrist supination    (Blank rows = not tested)  HAND  FUNCTION: {handfunction:27230}  COORDINATION: {otcoordination:27237}  SENSATION: {sensation:27233}  EDEMA: ***  MUSCLE TONE: {UETONE:25567}  COGNITION: Overall cognitive status: {cognition:24006}  VISION: Subjective report: *** Baseline vision: {OTBASELINEVISION:25363} Visual history: {OTVISUALHISTORY:25364}  VISION ASSESSMENT: {visionassessment:27231}  Patient has difficulty with following activities due to following visual impairments: ***  PERCEPTION: {Perception:25564}  PRAXIS: {Praxis:25565}  OBSERVATIONS: ***   TODAY'S TREATMENT:  DATE: ***   PATIENT EDUCATION: Education details: *** Person educated: {Person educated:25204} Education method: {Education Method:25205} Education comprehension: {Education Comprehension:25206}  HOME EXERCISE PROGRAM: ***   GOALS: Goals reviewed with patient? {yes/no:20286}  SHORT TERM GOALS: Target date: ***  *** Baseline: Goal status: {GOALSTATUS:25110}  2.  *** Baseline:  Goal status: {GOALSTATUS:25110}  3.  *** Baseline:  Goal status: {GOALSTATUS:25110}  4.  *** Baseline:  Goal status: {GOALSTATUS:25110}  5.  *** Baseline:  Goal status: {GOALSTATUS:25110}  6.  *** Baseline:  Goal status: {GOALSTATUS:25110}  LONG TERM GOALS: Target date: ***  *** Baseline:  Goal status: {GOALSTATUS:25110}  2.  *** Baseline:  Goal status: {GOALSTATUS:25110}  3.  *** Baseline:  Goal status: {GOALSTATUS:25110}  4.  *** Baseline:  Goal status: {GOALSTATUS:25110}  5.  *** Baseline:  Goal status: {GOALSTATUS:25110}  6.  *** Baseline:  Goal status: {GOALSTATUS:25110}  ASSESSMENT:  CLINICAL IMPRESSION: Patient is a *** y.o. *** who was seen today for occupational therapy evaluation for ***.   PERFORMANCE DEFICITS: in functional skills including {OT physical skills:25468},  cognitive skills including {OT cognitive skills:25469}, and psychosocial skills including {OT psychosocial skills:25470}.   IMPAIRMENTS: are limiting patient from {OT performance deficits:25471}.   CO-MORBIDITIES: {Comorbidities:25485} that affects occupational performance. Patient will benefit from skilled OT to address above impairments and improve overall function.  MODIFICATION OR ASSISTANCE TO COMPLETE EVALUATION: {OT modification:25474}  OT OCCUPATIONAL PROFILE AND HISTORY: {OT PROFILE AND HISTORY:25484}  CLINICAL DECISION MAKING: {OT CDM:25475}  REHAB POTENTIAL: {rehabpotential:25112}  EVALUATION COMPLEXITY: {Evaluation complexity:25115}    PLAN:  OT FREQUENCY: {rehab frequency:25116}  OT DURATION: {rehab duration:25117}  PLANNED INTERVENTIONS: {OT Interventions:25467}  RECOMMENDED OTHER SERVICES: ***  CONSULTED AND AGREED WITH PLAN OF CARE: [id number]  PLAN FOR NEXT SESSION: Theone Murdoch, OT 12/19/2022, 6:41 PM

## 2022-12-20 ENCOUNTER — Ambulatory Visit: Payer: Medicaid Other | Attending: Podiatry

## 2022-12-20 ENCOUNTER — Ambulatory Visit: Payer: Medicaid Other | Admitting: Occupational Therapy

## 2022-12-21 ENCOUNTER — Ambulatory Visit: Payer: Medicaid Other | Admitting: Internal Medicine

## 2022-12-21 NOTE — Progress Notes (Deleted)
Name: [name]  Age/ Sex: 47 y.o., female   MRN/ DOB: [id number], Aug 08, 1976     PCP: [name], MD   Reason for Endocrinology Evaluation: Type 2 Diabetes Mellitus     Initial Endocrinology Clinic Visit: 05/13/2014    PATIENT IDENTIFIER: [name] is a 47 y.o. 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 The patient has followed with Endocrinology clinic since 05/13/2014 for consultative assistance with management of her diabetes.  DIABETIC HISTORY:  [name] was diagnosed with T2DM in 2001 when she was hospitalized for symptomatic hyperglycemia. She was started insulin therapy in 2007. Her hemoglobin A1c has ranged from 9.6% in 2019, peaking at 14.6% in 2012.  On her initial visit to our clinic her A1c was 14.0% . She was on Metformin, byetta , novolog and Lantus   She is intolerant to metformin She was lost to follow from 2018-2020, upon her presentation back to our clinic in 2020, she was on Novolog Mix 70/30 .   SUBJECTIVE:   During the last visit (08/15/19): We increased morning Humulin Mix to 70 units and decreased evening dose to 35 units  Today (12/21/2022): [name] is here for a virtual follow up visit on diabetes management. She checks her blood sugars 1 times daily, preprandial to breakfast. The patient has not had hypoglycemic episodes since the last clinic visit. Otherwise, the patient has not required any recent emergency interventions for hypoglycemia but has presented to the ED in 06/2019. Pt tells me she was treated for an buttock abscess, S/P I&D, but in review of car everywhere, I could only find notes about chest pain. She admits to missing her insulin at times.   ROS: As per HPI    HOME DIABETES REGIMEN:  Humulin Mix (70/30) 70 units QAM, 35 units QPM  Statin: yes ACE-I/ARB: no   GLUCOSE LOG:  Date Breakfast  Supper  08/15/2019 120 -   9/8 150 -  9/7 110 -  9/6 120 -     DIABETIC COMPLICATIONS: Microvascular complications:  Neuropathy( S/P Left BKA and Right toes amputations) Denies: retinopathy, CKD Last Eye Exam: Completed 04/2018  Macrovascular complications:   Denies: CAD, CVA, PVD   HISTORY:  Past Medical History:  Past Medical History:  Diagnosis Date   Abscess of groin, left    Acute on chronic diastolic (congestive) heart failure ([place]) 02/05/2022   Acute on chronic respiratory failure with hypoxia ([place]) 04/11/2022   Acute osteomyelitis of ankle or foot, left ([place]) 01/31/2018   Acute respiratory failure with hypoxia and hypercarbia ([name]) 06/15/2020   Acute tubular necrosis ([place]) 07/12/2022   Alveolar hypoventilation    Amputation stump infection ([place]) 04/09/2018   Anemia    not on iron pill   Asthma    Bipolar 2 disorder ([name])    Candidiasis of vagina 05/15/2022   Carpal tunnel syndrome on right    recurrent   Cellulitis 08/2010-08/2011   Chest pain with low risk for cardiac etiology 05/11/2021   Chronic pain    COPD (chronic obstructive pulmonary disease) ([name])    Symbicort daily and Proventil as needed   Costochondritis    De Quervain's tenosynovitis, bilateral 11/01/2015   Depression    Diabetes mellitus type II, uncontrolled 2000   Type 2, Uncontrolled.Takes Lantus daily.Fasting blood sugar runs 150   Diabetic ulcer of right foot ([place]) 12/19/2021  Drug-seeking behavior    Elevated troponin    Exposure to mold 04/21/2020   GERD (gastroesophageal reflux disease)    takes Pantoprazole and Zantac daily   HLD (hyperlipidemia)    takes Atorvastatin daily   Hypertension    takes Lisinopril and Coreg daily   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[place]) 04/11/2019   Left shoulder pain 06/23/2019   Ludwig's angina 07/05/2022   Morbid obesity ([place])    Morbid obesity with BMI of 60.0-69.9, adult ([place]) 04/15/2021   Nocturia    Nonobstructive atherosclerosis of coronary artery 11/26/2020   11/26/20  Coronary angiogram: 1.  Mild diffuse proximal LAD stenosis with no evidence of obstructive disease (20 to 30% diffuse stenosis);  2.  Left dominant circumflex with widely patent obtuse marginal branches and left PDA branch, no significant stenoses present  3.  Nondominant RCA with mild diffuse nonobstructive stenosis    OSA on CPAP    Peripheral neuropathy    takes Gabapentin daily   Pneumonia    "walking" several yrs ago and as a baby (12/05/2018)   Pneumonia due to COVID-19 virus 04/14/2021   Primary osteoarthritis of first carpometacarpal joint of left hand 07/30/2016   Rectal fissure    Restless leg    Right carpal tunnel syndrome 09/01/2011   SVT (supraventricular tachycardia)    Syncope 02/25/2016   Thrombocytosis 04/11/2022   Urinary frequency    Urinary incontinence 10/23/2020   UTI (urinary tract infection) 04/15/2021   Varicose veins    Right medial thigh and Left leg    Past Surgical History:  Past Surgical History:  Procedure Laterality Date   AMPUTATION Left 02/01/2018   Procedure: LEFT FOURTH AND 5TH TOE RAY AMPUTATION;  Surgeon: [name], MD;  Location: [facility];  Service: Orthopedics;  Laterality: Left;   AMPUTATION Left 03/03/2018   Procedure: LEFT BELOW KNEE AMPUTATION;  Surgeon: [name], MD;  Location: [facility];  Service: Orthopedics;  Laterality: Left;   AMPUTATION Right 05/21/2022   Procedure: REVISION AMPUTATION RAY 1ST;  Surgeon: [name], DPM;  Location: [facility];  Service: Podiatry;  Laterality: Right;   CARPAL TUNNEL RELEASE Bilateral    CESAREAN SECTION  2007   CORONARY ANGIOGRAPHY N/A 11/26/2020   Procedure: CORONARY ANGIOGRAPHY;  Surgeon: Sherren Mocha, MD;  Location: [facility];  Service: Cardiovascular;  Laterality: N/A;   FOOT AMPUTATION Right    INCISION AND DRAINAGE ABSCESS Left 10/16/2021   Procedure: INCISION AND DRAINAGE ABSCESS;  Surgeon: Dwan Bolt, MD;  Location: [facility];  Service: General;  Laterality: Left;   INCISION  AND DRAINAGE PERIRECTAL ABSCESS Left 05/18/2019   Procedure: IRRIGATION AND DEBRIDEMENT OF PANNIS ABSCESS, POSSIBLE DEBRIDEMENT OF BUTTOCK WOUND;  Surgeon: Donnie Mesa, MD;  Location: [facility];  Service: General;  Laterality: Left;   IRRIGATION AND DEBRIDEMENT BUTTOCKS Left 05/17/2019   Procedure: IRRIGATION AND DEBRIDEMENT BUTTOCKS;  Surgeon: Donnie Mesa, MD;  Location: [facility];  Service: General;  Laterality: Left;   KNEE ARTHROSCOPY Right 07/17/2010   LEFT HEART CATHETERIZATION WITH CORONARY ANGIOGRAM N/A 07/27/2012   Procedure: LEFT HEART CATHETERIZATION WITH CORONARY ANGIOGRAM;  Surgeon: Sherren Mocha, MD;  Location: [facility];  Service: Cardiovascular;  Laterality: N/A;   MASS EXCISION N/A 06/29/2013   Procedure:  WIDE LOCAL EXCISION OF POSTERIOR NECK ABSCESS;  Surgeon: Ralene Ok, MD;  Location: [facility];  Service: General;  Laterality: N/A;   REPAIR KNEE LIGAMENT Left    "fixed ligaments and chipped patella"  right transmetatarsal amputation      TRACHEOSTOMY TUBE PLACEMENT N/A 07/10/2022   Procedure: CONTROLLED EXTUBATION;  Surgeon: Izora Gala, MD;  Location: [facility];  Service: ENT;  Laterality: N/A;   Social History:  reports that she quit smoking about 17 years ago. Her smoking use included cigarettes. She has a 3.75 pack-year smoking history. She has never used smokeless tobacco. She reports that she does not currently use drugs. She reports that she does not drink alcohol. Family History:  Family History  Problem Relation Age of Onset   Diabetes Mother    Hyperlipidemia Mother    Depression Mother    GER disease Mother    Allergic rhinitis Mother    Restless legs syndrome Mother    Heart attack Paternal Uncle    Heart disease Paternal Grandmother    Heart attack Paternal Grandmother    Heart attack Paternal Grandfather    Heart disease Paternal Grandfather    Heart attack Father    Migraines Sister    Cancer Maternal Grandmother        COLON   Hypertension Maternal  Grandmother    Hyperlipidemia Maternal Grandmother    Diabetes Maternal Grandmother    Other Maternal Grandfather        GUN SHOT   Anxiety disorder Sister    Asthma Child      HOME MEDICATIONS: Allergies as of 12/21/2022       Reactions   Cefepime Other (See Comments)   AKI, see records from [place] hospitalization in January 2020.   Other reaction(s): Other (See Comments) "Shut down organs/kidneys" Note pt has tolerated Rocephin and Keflex Other reaction(s): Not available   Iodine Other (See Comments)   Kidney dysfunction   Kiwi Extract Shortness Of Breath, Swelling, Anaphylaxis   Other reaction(s): Not available   Morphine And Related Nausea And Vomiting   Nalbuphine Other (See Comments)   Hallucinations Other reaction(s): Not available   Pentoxifylline Nausea And Vomiting   Other reaction(s): Not available   Cephalosporins Other (See Comments)   Pt states that they cause her kidneys to shut down   Toradol [ketorolac Tromethamine] Other (See Comments)   Feels like something is crawling on me   Morphine Nausea And Vomiting   Other reaction(s): Not available   Nubain [nalbuphine Hcl] Other (See Comments)   "FEELS LIKE SOMETHING CRAWLING ON ME"        Medication List        Accurate as of December 21, 2022  7:18 AM. If you have any questions, ask your nurse or doctor.          acetaminophen 325 MG tablet Commonly known as: TYLENOL Take 1-2 tablets (325-650 mg total) by mouth every 4 (four) hours as needed for mild pain.   amLODipine 10 MG tablet Commonly known as: NORVASC Take 1 tablet (10 mg total) by mouth daily.   aspirin EC 81 MG tablet Take 1 tablet (81 mg total) by mouth daily. Swallow whole.   atropine 1 % ophthalmic solution Place 1 drop into the right eye daily.   cetirizine 10 MG tablet Commonly known as: ZYRTEC Take 1 tablet (10 mg total) by mouth daily as needed for allergies.   dexamethasone 6 MG tablet Commonly known as: DECADRON Take 1  tablet (6 mg total) by mouth daily.   Dexcom G6 Sensor Misc Inject 1 applicator into the skin as directed. Change sensor every 10 days.   Dexcom G6 Transmitter Misc Inject 1 Device into the  skin as directed. Reuse 8 times with sensor changes.   DULoxetine 60 MG capsule Commonly known as: CYMBALTA Take 2 capsules (120 mg total) by mouth daily.   gabapentin 600 MG tablet Commonly known as: NEURONTIN TAKE 2 TABLETS BY MOUTH 3 TIMES DAILY What changed: See the new instructions.   GNP UltiCare Pen Needles 32G X 4 MM Misc Generic drug: Insulin Pen Needle USE TO inject insulin 2 TIMES DAILY   ipratropium-albuterol 0.5-2.5 (3) MG/3ML Soln Commonly known as: DUONEB Take 3 mLs by nebulization every 6 (six) hours as needed. What changed: reasons to take this   nitroGLYCERIN 0.4 MG SL tablet Commonly known as: NITROSTAT Place 1 tablet (0.4 mg total) under the tongue every 5 (five) minutes as needed for chest pain.   NovoLOG FlexPen 100 UNIT/ML FlexPen Generic drug: insulin aspart Inject 20 Units into the skin 3 (three) times daily with meals. What changed: how much to take   nystatin powder Commonly known as: MYCOSTATIN/NYSTOP Apply 1 application. topically 3 (three) times daily.   Omnipod 5 G6 Pod (Gen 5) Misc Inject into the skin daily.   ondansetron 4 MG tablet Commonly known as: Zofran Take 1 tablet (4 mg total) by mouth every 8 (eight) hours as needed for nausea or vomiting.   oxyCODONE-acetaminophen 5-325 MG tablet Commonly known as: PERCOCET/ROXICET Take 1 tablet by mouth daily as needed for severe pain.   pantoprazole 40 MG tablet Commonly known as: PROTONIX Take 1 tablet (40 mg total) by mouth daily.   polyethylene glycol powder 17 GM/SCOOP powder Commonly known as: MiraLax Take 17 g by mouth daily as needed (constipation).   ProAir HFA 108 (90 Base) MCG/ACT inhaler Generic drug: albuterol INHALE 1 TO 2 PUFFS BY MOUTH EVERY 6 HOURS AS NEEDED FOR WHEEZING OR  SHORTNESS OF BREATH What changed: See the new instructions.   rOPINIRole 1 MG tablet Commonly known as: REQUIP Take 1 tablet (1 mg total) by mouth at bedtime.   rosuvastatin 40 MG tablet Commonly known as: Crestor Take 1 tablet (40 mg total) by mouth daily.   Simbrinza 1-0.2 % Susp Generic drug: Brinzolamide-Brimonidine Place 1 drop into the left eye in the morning, at noon, and at bedtime.   Symbicort 160-4.5 MCG/ACT inhaler Generic drug: budesonide-formoterol INHALE 2 PUFFS into THE lungs 2 TIMES DAILY   torsemide 20 MG tablet Commonly known as: DEMADEX Take 4 tablets (80 mg total) by mouth 2 (two) times daily. What changed:  how much to take when to take this additional instructions   traZODone 100 MG tablet Commonly known as: DESYREL Take 1 tablet (100 mg total) by mouth at bedtime as needed. for sleep   Tyler Aas FlexTouch 200 UNIT/ML FlexTouch Pen Generic drug: insulin degludec Inject 52 Units into the skin 2 (two) times daily. What changed: how much to take   Vitamin D (Cholecalciferol) 25 MCG (1000 UT) Caps Take 1,000 Units by mouth daily.          DATA REVIEWED:  Lab Results  Component Value Date   HGBA1C 11.0 (H) 11/29/2022   HGBA1C 14.9 (H) 05/16/2022   HGBA1C >15.5 (H) 12/19/2021   Lab Results  Component Value Date   MICROALBUR 17.4 (H) 01/24/2015   LDLCALC 174 (H) 09/27/2022   CREATININE 1.30 (H) 12/09/2022    ASSESSMENT / PLAN / RECOMMENDATIONS:   1) Type 2 Diabetes Mellitus, Poorly controlled, With neuropathic complications - Most recent A1c of 17.5 %. Goal A1c < 7.0 %.    - Today  was a virtual visit and she provided [place] with "Optimal readings" fasting BG's 120-150 , which were similar to her readings from May, 2020 but her A1c in 05/2019  was > 17.0%.  Also when I looked at serum glucose from her ED visit from July, her BG's were > 400 mg/dL.  - Unfortunately I am limited with these virtual visit as I can't personally look at her meter or  download, so will not be making any changes at this time   - She is intolerant to Metformin - We discussed adding a GLP-1 agonist, discussed GI side effects, no prior hx of pancreatitis. No FH of medullary cancer   MEDICATIONS: Humulin mix (70/30)  70 units with Breakfast and  35 units with Supper  Ozempic 0.25 mg weekly, after 6 weeks may increase to 0.5 mg weekly if no side effects   EDUCATION / INSTRUCTIONS: BG monitoring instructions: Patient is instructed to check her blood sugars 2 times a day, fasting and supper time. Call [facility] if: BG persistently < 70 or > 300. I reviewed the Rule of 15 for the treatment of hypoglycemia in detail with the patient. Literature supplied.   I discussed the assessment and treatment plan with the patient. The patient was provided an opportunity to ask questions and all were answered. The patient agreed with the plan and demonstrated an understanding of the instructions.   The patient was advised to call back or seek an in-person evaluation if the symptoms worsen or if the condition fails to improve as anticipated.    F/U in 2 months    Signed electronically by: Mack Guise, MD  [facility]  [facility] [address] [place], [place] [address] Phone: [phone] FAX: [phone]   CC: [name], [place] [address] Phone: [phone]  Fax: [phone]  Return to Endocrinology clinic as below: Future Appointments  Date Time Provider [place]  12/21/2022 10:50 AM Janaia Kozel, Melanie Crazier, MD [facility] None  12/23/2022  1:45 PM [name], DPM [facility]  12/28/2022  2:30 PM Martyn Ehrich, NP [facility] None  01/13/2023 11:30 AM [name], MD [facility]

## 2022-12-23 ENCOUNTER — Ambulatory Visit: Payer: Medicaid Other | Admitting: Podiatry

## 2022-12-28 ENCOUNTER — Ambulatory Visit (INDEPENDENT_AMBULATORY_CARE_PROVIDER_SITE_OTHER): Payer: Medicaid Other | Admitting: Primary Care

## 2022-12-28 ENCOUNTER — Encounter: Payer: Self-pay | Admitting: Primary Care

## 2022-12-28 VITALS — BP 106/68 | HR 104 | Ht 62.0 in | Wt 347.0 lb

## 2022-12-28 DIAGNOSIS — J9611 Chronic respiratory failure with hypoxia: Secondary | ICD-10-CM

## 2022-12-28 DIAGNOSIS — J45909 Unspecified asthma, uncomplicated: Secondary | ICD-10-CM

## 2022-12-28 NOTE — Assessment & Plan Note (Addendum)
-   Continue supplemental oxygen and Trilogy ventilator at bedtime - Qualified for POC today, needs to use 3L with exertion  - Spo2 goal >90%

## 2022-12-28 NOTE — Patient Instructions (Addendum)
Recommendations: - Continue Symbicort two puffs morning and evening - Use Spiriva sample for 2 weeks; then once able to get nebulizer machine resume DUONEB (ipratropium-albuterol) every 6 hours as needed  - Continue to use 3L oxygen to maintain O2 > 88-90%  - Start saline nasal mist twice daily and AYR nasal gel  Orders: - Inogen portable oxygen concentrator @ 3L - Add humidification to oxygen/Trilogy ventilator   Follow-up: - 3 months with Dr. Shearon Stalls

## 2022-12-28 NOTE — Progress Notes (Signed)
$'@Patient'T$  ID: Baker Pierini, female    DOB: 1976-01-05, 47 y.o.   MRN: [id number]  Chief Complaint  Patient presents with   Follow-up    POC qualification O2 3 l    Referring provider: [name], MD  HPI: 47 year old female, former smoker.  Past medical history significant for hypertension, coronary artery disease, congestive heart failure,, chronic asthma, chronic respiratory failure with hypoxia, obstructive sleep apnea, GERD, type 2 diabetes.   12/28/2022 Patient presents today for [place] qualification. Follows with Dr. Shearon Stalls for history chronic respiratory failure, attempted to qualify for POC at her last visit in October.  She was hospitalized in late December for CHF exacerbation and tested positive for COVID on January 3rd. She is primarily wheelchair bound, not very active. She is on Trilogy ventilator at night. Maintained on Symbicort and Duoneb QID.She is on oxygen 24/7. Unable to use nebulizer, she put nebulizer in storage. She is going to storage unit in two weeks.     Allergies  Allergen Reactions   Cefepime Other (See Comments)    AKI, see records from [facility] hospitalization in January 2020.   Other reaction(s): Other (See Comments) "Shut down organs/kidneys"  Note pt has tolerated Rocephin and Keflex Other reaction(s): Not available   Iodine Other (See Comments)    Kidney dysfunction   Kiwi Extract Shortness Of Breath, Swelling and Anaphylaxis    Other reaction(s): Not available   Morphine And Related Nausea And Vomiting   Nalbuphine Other (See Comments)    Hallucinations   Other reaction(s): Not available   Pentoxifylline Nausea And Vomiting    Other reaction(s): Not available   Cephalosporins Other (See Comments)    Pt states that they cause her kidneys to shut down   Toradol [Ketorolac Tromethamine] Other (See Comments)    Feels like something is crawling on me   Morphine Nausea And Vomiting    Other reaction(s): Not available   Nubain [Nalbuphine  Hcl] Other (See Comments)    "FEELS LIKE SOMETHING CRAWLING ON ME"    Immunization History  Administered Date(s) Administered   Influenza-Unspecified 07/22/2017   PFIZER(Purple Top)SARS-COV-2 Vaccination 06/26/2020, 07/17/2020, 12/18/2020   PNEUMOCOCCAL CONJUGATE-20 05/28/2021   Pfizer Covid-19 Vaccine Bivalent Booster 52yr & up 03/18/2022   Pneumococcal Polysaccharide-23 09/21/2016   Pneumococcal-Unspecified 05/28/2021   Tdap 05/04/2014, 06/29/2015    Past Medical History:  Diagnosis Date   Abscess of groin, left    [facility]ute on chronic diastolic [place]congestive) heart failure [place] 02/05/2022   Acute on chronic respiratory failure with hypoxia ([facility] [place] 04/11/2022   Acute osteomyelitis of ankle or foot, left ([facility] 01/31/2018   Acute respiratory failure with hypoxia and hypercarbia ([name]) 06/15/2020   Acute tubular necrosis ([facility] 07/12/2022   Alveolar hypoventilation    Amputation stump infection ([facility] 04/09/2018   Anemia    not on iron pill   Asthma    Bipolar 2 disorder ([name])    Candidiasis of vagina 05/15/2022   Carpal tunnel syndrome on right    recurrent   Cellulitis 08/2010-08/2011   Chest pain with low risk for cardiac etiology 05/11/2021   Chronic pain    COPD (chronic obstructive pulmonary disease) ([name])    Symbicort daily and Proventil as needed   Costochondritis    De Quervain's tenosynovitis, bilateral 11/01/2015   Depression    Diabetes mellitus type II, uncontrolled 2000   Type 2, Uncontrolled.Takes Lantus daily.Fasting blood sugar runs 150   Diabetic ulcer of right foot ([place] 12/19/2021   Drug-seeking behavior  Elevated troponin    Exposure to mold 04/21/2020   GERD (gastroesophageal reflux disease)    takes Pantoprazole and Zantac daily   HLD (hyperlipidemia)    takes Atorvastatin daily   Hypertension    takes Lisinopril and Coreg daily   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[place]) 04/11/2019   Left shoulder pain 06/23/2019   Ludwig's angina 07/05/2022   Morbid obesity  ([place])    Morbid obesity with BMI of 60.0-69.9, adult ([place]) 04/15/2021   Nocturia    Nonobstructive atherosclerosis of coronary artery 11/26/2020   11/26/20 Coronary angiogram: 1.  Mild diffuse proximal LAD stenosis with no evidence of obstructive disease (20 to 30% diffuse stenosis);  2.  Left dominant circumflex with widely patent obtuse marginal branches and left PDA branch, no significant stenoses present  3.  Nondominant RCA with mild diffuse nonobstructive stenosis    OSA on CPAP    Peripheral neuropathy    takes Gabapentin daily   Pneumonia    "walking" several yrs ago and as a baby (12/05/2018)   Pneumonia due to COVID-19 virus 04/14/2021   Primary osteoarthritis of first carpometacarpal joint of left hand 07/30/2016   Rectal fissure    Restless leg    Right carpal tunnel syndrome 09/01/2011   SVT (supraventricular tachycardia)    Syncope 02/25/2016   Thrombocytosis 04/11/2022   Urinary frequency    Urinary incontinence 10/23/2020   UTI (urinary tract infection) 04/15/2021   Varicose veins    Right medial thigh and Left leg     Tobacco History: Social History   Tobacco Use  Smoking Status Former   Packs/day: 0.25   Years: 15.00   Total pack years: 3.75   Types: Cigarettes   Quit date: 12/06/2005   Years since quitting: 17.1  Smokeless Tobacco Never  Tobacco Comments   smokes for a couple of months   Counseling given: Not Answered Tobacco comments: smokes for a couple of months   Outpatient Medications Prior to Visit  Medication Sig Dispense Refill   amLODipine (NORVASC) 10 MG tablet Take 1 tablet (10 mg total) by mouth daily. 90 tablet 3   aspirin EC 81 MG tablet Take 1 tablet (81 mg total) by mouth daily. Swallow whole. 90 tablet 3   atropine 1 % ophthalmic solution Place 1 drop into the right eye daily.     Brinzolamide-Brimonidine (SIMBRINZA) 1-0.2 % SUSP Place 1 drop into the left eye in the morning, at noon, and at bedtime.     Continuous Blood Gluc Sensor  (DEXCOM G6 SENSOR) MISC Inject 1 applicator into the skin as directed. Change sensor every 10 days. 3 each 11   Continuous Blood Gluc Transmit (DEXCOM G6 TRANSMITTER) MISC Inject 1 Device into the skin as directed. Reuse 8 times with sensor changes. 1 each 3   dexamethasone (DECADRON) 6 MG tablet Take 1 tablet (6 mg total) by mouth daily. 7 tablet 0   DULoxetine (CYMBALTA) 60 MG capsule Take 2 capsules (120 mg total) by mouth daily. 60 capsule 1   gabapentin (NEURONTIN) 600 MG tablet TAKE 2 TABLETS BY MOUTH 3 TIMES DAILY (Patient taking differently: Take 1,200 mg by mouth 3 (three) times daily.) 180 tablet 2   GNP ULTICARE PEN NEEDLES 32G X 4 MM MISC USE TO inject insulin 2 TIMES DAILY 100 each 2   insulin aspart (NOVOLOG FLEXPEN) 100 UNIT/ML FlexPen Inject 20 Units into the skin 3 (three) times daily with meals. (Patient taking differently: Inject 30 Units into the skin 3 (three)  times daily with meals.) 45 mL 3   insulin degludec (TRESIBA FLEXTOUCH) 200 UNIT/ML FlexTouch Pen Inject 52 Units into the skin 2 (two) times daily. (Patient taking differently: Inject 60 Units into the skin 2 (two) times daily.) 30 mL 3   Insulin Disposable Pump (OMNIPOD 5 G6 POD, GEN 5,) MISC Inject into the skin daily.     nitroGLYCERIN (NITROSTAT) 0.4 MG SL tablet Place 1 tablet (0.4 mg total) under the tongue every 5 (five) minutes as needed for chest pain. 30 tablet 2   nystatin (MYCOSTATIN/NYSTOP) powder Apply 1 application. topically 3 (three) times daily. 30 g 0   ondansetron (ZOFRAN) 4 MG tablet Take 1 tablet (4 mg total) by mouth every 8 (eight) hours as needed for nausea or vomiting. 60 tablet 1   polyethylene glycol powder (MIRALAX) 17 GM/SCOOP powder Take 17 g by mouth daily as needed (constipation). 255 g 0   PROAIR HFA 108 (90 Base) MCG/ACT inhaler INHALE 1 TO 2 PUFFS BY MOUTH EVERY 6 HOURS AS NEEDED FOR WHEEZING OR SHORTNESS OF BREATH (Patient taking differently: Inhale 2 puffs into the lungs every 6 (six)  hours as needed for wheezing or shortness of breath.) 8.5 g 4   rOPINIRole (REQUIP) 1 MG tablet Take 1 tablet (1 mg total) by mouth at bedtime. 90 tablet 1   SYMBICORT 160-4.5 MCG/ACT inhaler INHALE 2 PUFFS into THE lungs 2 TIMES DAILY (Patient taking differently: Inhale 2 puffs into the lungs 2 (two) times daily.) 10.2 g 4   torsemide (DEMADEX) 20 MG tablet Take 4 tablets (80 mg total) by mouth 2 (two) times daily. (Patient taking differently: Take 20 mg by mouth See admin instructions. Pt states she takes '100mg'$  in the AM and '80mg'$  in the PM) 720 tablet 3   traZODone (DESYREL) 100 MG tablet Take 1 tablet (100 mg total) by mouth at bedtime as needed. for sleep 30 tablet 1   Vitamin D, Cholecalciferol, 25 MCG (1000 UT) CAPS Take 1,000 Units by mouth daily. 90 capsule 3   acetaminophen (TYLENOL) 325 MG tablet Take 1-2 tablets (325-650 mg total) by mouth every 4 (four) hours as needed for mild pain. (Patient not taking: Reported on 12/28/2022)     cetirizine (ZYRTEC) 10 MG tablet Take 1 tablet (10 mg total) by mouth daily as needed for allergies. (Patient not taking: Reported on 12/28/2022) 30 tablet 3   ipratropium-albuterol (DUONEB) 0.5-2.5 (3) MG/3ML SOLN Take 3 mLs by nebulization every 6 (six) hours as needed. (Patient not taking: Reported on 12/28/2022) 360 mL 11   oxyCODONE-acetaminophen (PERCOCET/ROXICET) 5-325 MG tablet Take 1 tablet by mouth daily as needed for severe pain. (Patient not taking: Reported on 11/28/2022) 30 tablet 0   pantoprazole (PROTONIX) 40 MG tablet Take 1 tablet (40 mg total) by mouth daily. (Patient not taking: Reported on 12/28/2022) 90 tablet 2   rosuvastatin (CRESTOR) 40 MG tablet Take 1 tablet (40 mg total) by mouth daily. (Patient not taking: Reported on 12/28/2022) 90 tablet 3   No facility-administered medications prior to visit.   Review of Systems  Review of Systems  Constitutional: Negative.   HENT: Negative.    Respiratory: Negative.      Physical Exam  BP  106/68 (BP Location: Left Arm, Patient Position: Sitting, Cuff Size: Large)   Pulse (!) 104   Ht '5\' 2"'$  (1.575 m)   Wt (!) 347 lb (157.4 kg)   LMP  (LMP Unknown)   SpO2 98%   BMI 63.47 kg/m  Physical Exam Constitutional:      Appearance: Normal appearance.  HENT:     Head: Normocephalic and atraumatic.     Mouth/Throat:     Mouth: Mucous membranes are moist.     Pharynx: Oropharynx is clear.  Cardiovascular:     Rate and Rhythm: Normal rate and regular rhythm.  Pulmonary:     Effort: Pulmonary effort is normal.     Breath sounds: Normal breath sounds.  Musculoskeletal:        General: Normal range of motion.  Skin:    General: Skin is warm.     Findings: No bruising.  Neurological:     General: No focal deficit present.     Mental Status: She is alert and oriented to person, place, and time. Mental status is at baseline.  Psychiatric:        Mood and Affect: Mood normal.        Behavior: Behavior normal.        Thought Content: Thought content normal.        Judgment: Judgment normal.      Lab Results:  CBC    Component Value Date/Time   WBC 5.4 12/08/2022 1727   RBC 3.70 (L) 12/08/2022 1727   HGB 10.5 (L) 12/08/2022 1727   HGB 12.3 07/19/2022 1115   HCT 34.1 (L) 12/08/2022 1727   HCT 38.3 07/19/2022 1115   PLT 288 12/08/2022 1727   PLT 487 (H) 07/19/2022 1115   MCV 92.2 12/08/2022 1727   MCV 91 07/19/2022 1115   MCH 28.4 12/08/2022 1727   MCHC 30.8 12/08/2022 1727   RDW 15.6 (H) 12/08/2022 1727   RDW 14.3 07/19/2022 1115   LYMPHSABS 2.3 11/28/2022 0530   LYMPHSABS 4.5 (H) 09/19/2018 0928   MONOABS 0.7 11/28/2022 0530   EOSABS 0.3 11/28/2022 0530   EOSABS 0.2 09/19/2018 0928   BASOSABS 0.1 11/28/2022 0530   BASOSABS 0.1 09/19/2018 0928    BMET    Component Value Date/Time   NA 139 12/09/2022 0042   NA 143 11/04/2022 1409   K 3.6 12/09/2022 0042   CL 94 (L) 12/09/2022 0042   CO2 36 (H) 12/09/2022 0042   GLUCOSE 126 (H) 12/09/2022 0042   BUN 52  (H) 12/09/2022 0042   BUN 43 (H) 11/04/2022 1409   CREATININE 1.30 (H) 12/09/2022 0042   CREATININE 0.51 08/05/2015 1114   CALCIUM 8.5 (L) 12/09/2022 0042   GFRNONAA 52 (L) 12/09/2022 0042   GFRAA 77 12/09/2020 1605    BNP    Component Value Date/Time   BNP 15.0 12/08/2022 1728    ProBNP    Component Value Date/Time   PROBNP 35.0 11/14/2020 1051    Imaging: No results found.   Assessment & Plan:   Chronic respiratory failure with hypoxia ([name]) - Continue supplemental oxygen and Trilogy ventilator at bedtime - Qualified for POC today, needs to use 3L with exertion  - Spo2 goal >90%  Asthma, chronic - Stable; No acute symptoms. Hospitalized in January for covid-19 - Continue Symbicort 150mg two puffs morning and evening - Use Spiriva sample for 2 weeks; then once able to get nebulizer machine resume DUONEB (ipratropium-albuterol) every 6 hours as needed    EMartyn Ehrich NP 01/10/2023

## 2023-01-04 ENCOUNTER — Telehealth: Payer: Self-pay | Admitting: Primary Care

## 2023-01-04 DIAGNOSIS — J9611 Chronic respiratory failure with hypoxia: Secondary | ICD-10-CM

## 2023-01-04 NOTE — Telephone Encounter (Signed)
Patient would like the nurse to call regarding her evaluation for her portable oxygen.  She stated [facility] did not have the eval.  Please call to discuss.  CB# [phone]

## 2023-01-05 NOTE — Telephone Encounter (Signed)
Called and spoke with patient and she states [facility] does not have POC order. Looked and the order was put in incorrect. Placed new order and updated walk information. Nothing further needed

## 2023-01-06 DIAGNOSIS — J45909 Unspecified asthma, uncomplicated: Secondary | ICD-10-CM | POA: Diagnosis not present

## 2023-01-06 DIAGNOSIS — M86172 Other acute osteomyelitis, left ankle and foot: Secondary | ICD-10-CM | POA: Diagnosis not present

## 2023-01-06 DIAGNOSIS — G4733 Obstructive sleep apnea (adult) (pediatric): Secondary | ICD-10-CM | POA: Diagnosis not present

## 2023-01-06 DIAGNOSIS — E669 Obesity, unspecified: Secondary | ICD-10-CM | POA: Diagnosis not present

## 2023-01-07 DIAGNOSIS — E119 Type 2 diabetes mellitus without complications: Secondary | ICD-10-CM | POA: Diagnosis not present

## 2023-01-07 DIAGNOSIS — R32 Unspecified urinary incontinence: Secondary | ICD-10-CM | POA: Diagnosis not present

## 2023-01-07 DIAGNOSIS — Z89512 Acquired absence of left leg below knee: Secondary | ICD-10-CM | POA: Diagnosis not present

## 2023-01-10 NOTE — Assessment & Plan Note (Signed)
-   Stable; No acute symptoms. Hospitalized in January for covid-19 - Continue Symbicort 172mg two puffs morning and evening - Use Spiriva sample for 2 weeks; then once able to get nebulizer machine resume DUONEB (ipratropium-albuterol) every 6 hours as needed

## 2023-01-13 ENCOUNTER — Telehealth (INDEPENDENT_AMBULATORY_CARE_PROVIDER_SITE_OTHER): Payer: Medicaid Other | Admitting: Family Medicine

## 2023-01-13 DIAGNOSIS — R251 Tremor, unspecified: Secondary | ICD-10-CM

## 2023-01-13 DIAGNOSIS — M25649 Stiffness of unspecified hand, not elsewhere classified: Secondary | ICD-10-CM

## 2023-01-13 NOTE — Progress Notes (Signed)
[place] Telemedicine Visit  Patient consented to have virtual visit and was identified by name and date of birth. Method of visit: Video  Encounter participants: Patient: [name] - located at home Provider: [name] - located at home office Others (if applicable): None  Chief Complaint: Follow-up  HPI:  Sleep issues: Doing a lot of sleeping - for past 2-3 weeks, sleeping a lot even during the day.  Has adjusted her own medications to try and make this better.  Has not taken Cymbalta since early to mid January.  She thought this might be making her sleepy.  She intermittently tried melatonin but found it made her too sleepy as well.  Last night she did not take her gabapentin or trazodone, and she was able to wake up on time this morning.  She did take gabapentin during the day yesterday.  Continues to use her BiPAP at night.  Feels well today.  Jerking of hands: Has noticed that both of her hands tend to jerk and she cannot keep a grip on things.  Some days it is fine and other days it is more prominent.  Also feels like her legs jerk.  Exam:  General: No acute distress, pleasant, cooperative Respiratory: Nasal cannula in place, normal effort, speaks in full sentences without distress  Assessment/Plan:  Sleep issues: Seemed to do well not taking gabapentin or trazodone last night.  Advised gradually tapering down off of trazodone so that she does not have withdrawal effects from it.  She will start with breaking it in half and then quarter it.  I think it is fine to not take her nighttime dose of gabapentin, if she still taking it during the day hopefully will not have any withdrawal symptoms from it.  Jerking of hands: Challenging to evaluate over video.  She will come in on Monday for some labs.  Want to check electrolytes as well as inflammatory markers and rheum labs given prior report of hand stiffness in the mornings.  Follow-up in office with  me in a few weeks, scheduled first week of March.  Time spent during visit with patient: 15 minutes

## 2023-01-17 ENCOUNTER — Other Ambulatory Visit: Payer: Self-pay

## 2023-01-20 ENCOUNTER — Ambulatory Visit (INDEPENDENT_AMBULATORY_CARE_PROVIDER_SITE_OTHER): Payer: Medicaid Other | Admitting: Podiatry

## 2023-01-20 DIAGNOSIS — Z794 Long term (current) use of insulin: Secondary | ICD-10-CM

## 2023-01-20 DIAGNOSIS — L89611 Pressure ulcer of right heel, stage 1: Secondary | ICD-10-CM

## 2023-01-20 DIAGNOSIS — L853 Xerosis cutis: Secondary | ICD-10-CM

## 2023-01-20 MED ORDER — AMMONIUM LACTATE 12 % EX LOTN: 1.0000 | TOPICAL_LOTION | CUTANEOUS | 0 refills | Status: DC | PRN

## 2023-01-20 NOTE — Progress Notes (Signed)
Subjective:  Patient ID: [name], female    DOB: 1976/08/26,  MRN: [id number]  Chief Complaint  Patient presents with   Callouses    Pt stated that her skin is really dry causing her pain     47 y.o. female presents with the above complaint.  Patient presents with new complaint of right posterior heel preulcerative callus/lesion.  Patient states that she uses a leg to scoot up on the bed and may have put a lot of pressure to the heel leading to breakdown.  They have been keeping covered with dry dressing.  She wanted discuss treatment options for it.  She also has secondary complaint dry skin for which she would like to discuss lotion options.  She has not seen MRIs prior to seeing me for this.   Review of Systems: Negative except as noted in the HPI. Denies N/V/F/Ch.  Past Medical History:  Diagnosis Date   Abscess of groin, left    Acute on chronic diastolic (congestive) heart failure ([place]) 02/05/2022   Acute on chronic respiratory failure with hypoxia ([name]) 04/11/2022   Acute osteomyelitis of ankle or foot, left ([place]) 01/31/2018   Acute respiratory failure with hypoxia and hypercarbia ([name]) 06/15/2020   Acute tubular necrosis ([name]) 07/12/2022   Alveolar hypoventilation    Amputation stump infection ([place]) 04/09/2018   Anemia    not on iron pill   Asthma    Bipolar 2 disorder ([name])    Candidiasis of vagina 05/15/2022   Carpal tunnel syndrome on right    recurrent   Cellulitis 08/2010-08/2011   Chest pain with low risk for cardiac etiology 05/11/2021   Chronic pain    COPD (chronic obstructive pulmonary disease) ([name])    Symbicort daily and Proventil as needed   Costochondritis    De Quervain's tenosynovitis, bilateral 11/01/2015   Depression    Diabetes mellitus type II, uncontrolled 2000   Type 2, Uncontrolled.Takes Lantus daily.Fasting blood sugar runs 150   Diabetic ulcer of right foot ([place]) 12/19/2021   Drug-seeking behavior    Elevated troponin    Exposure to mold  04/21/2020   GERD (gastroesophageal reflux disease)    takes Pantoprazole and Zantac daily   HLD (hyperlipidemia)    takes Atorvastatin daily   Hypertension    takes Lisinopril and Coreg daily   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[place]) 04/11/2019   Left shoulder pain 06/23/2019   Ludwig's angina 07/05/2022   Morbid obesity ([place])    Morbid obesity with BMI of 60.0-69.9, adult ([place]) 04/15/2021   Nocturia    Nonobstructive atherosclerosis of coronary artery 11/26/2020   11/26/20 Coronary angiogram: 1.  Mild diffuse proximal LAD stenosis with no evidence of obstructive disease (20 to 30% diffuse stenosis);  2.  Left dominant circumflex with widely patent obtuse marginal branches and left PDA branch, no significant stenoses present  3.  Nondominant RCA with mild diffuse nonobstructive stenosis    OSA on CPAP    Peripheral neuropathy    takes Gabapentin daily   Pneumonia    "walking" several yrs ago and as a baby (12/05/2018)   Pneumonia due to COVID-19 virus 04/14/2021   Primary osteoarthritis of first carpometacarpal joint of left hand 07/30/2016   Rectal fissure    Restless leg    Right carpal tunnel syndrome 09/01/2011   SVT (supraventricular tachycardia)    Syncope 02/25/2016   Thrombocytosis 04/11/2022   Urinary frequency    Urinary incontinence 10/23/2020   UTI (urinary tract infection) 04/15/2021  Varicose veins    Right medial thigh and Left leg     Current Outpatient Medications:    ammonium lactate (AMLACTIN DAILY) 12 % lotion, Apply 1 Application topically as needed for dry skin., Disp: 400 g, Rfl: 0   acetaminophen (TYLENOL) 325 MG tablet, Take 1-2 tablets (325-650 mg total) by mouth every 4 (four) hours as needed for mild pain., Disp: , Rfl:    amLODipine (NORVASC) 10 MG tablet, Take 1 tablet (10 mg total) by mouth daily., Disp: 90 tablet, Rfl: 3   aspirin EC 81 MG tablet, Take 1 tablet (81 mg total) by mouth daily. Swallow whole., Disp: 90 tablet, Rfl: 3   atropine 1 %  ophthalmic solution, Place 1 drop into the right eye daily., Disp: , Rfl:    BIOTIN PO, Take by mouth., Disp: , Rfl:    Brinzolamide-Brimonidine (SIMBRINZA) 1-0.2 % SUSP, Place 1 drop into the left eye in the morning, at noon, and at bedtime., Disp: , Rfl:    cetirizine (ZYRTEC) 10 MG tablet, Take 1 tablet (10 mg total) by mouth daily as needed for allergies., Disp: 30 tablet, Rfl: 3   Continuous Blood Gluc Sensor (DEXCOM G6 SENSOR) MISC, Inject 1 applicator into the skin as directed. Change sensor every 10 days., Disp: 3 each, Rfl: 11   Continuous Blood Gluc Transmit (DEXCOM G6 TRANSMITTER) MISC, Inject 1 Device into the skin as directed. Reuse 8 times with sensor changes., Disp: 1 each, Rfl: 3   DULoxetine (CYMBALTA) 60 MG capsule, Take 2 capsules (120 mg total) by mouth daily., Disp: 60 capsule, Rfl: 1   gabapentin (NEURONTIN) 600 MG tablet, TAKE 2 TABLETS BY MOUTH 3 TIMES DAILY (Patient taking differently: Take 1,200 mg by mouth 3 (three) times daily.), Disp: 180 tablet, Rfl: 2   GNP ULTICARE PEN NEEDLES 32G X 4 MM MISC, USE TO inject insulin 2 TIMES DAILY, Disp: 100 each, Rfl: 2   insulin aspart (NOVOLOG FLEXPEN) 100 UNIT/ML FlexPen, Inject 20 Units into the skin 3 (three) times daily with meals. (Patient taking differently: Inject 30 Units into the skin 3 (three) times daily with meals.), Disp: 45 mL, Rfl: 3   insulin degludec (TRESIBA FLEXTOUCH) 200 UNIT/ML FlexTouch Pen, Inject 52 Units into the skin 2 (two) times daily. (Patient taking differently: Inject 60 Units into the skin 2 (two) times daily.), Disp: 30 mL, Rfl: 3   Insulin Disposable Pump (OMNIPOD 5 G6 POD, GEN 5,) MISC, Inject into the skin daily., Disp: , Rfl:    ipratropium-albuterol (DUONEB) 0.5-2.5 (3) MG/3ML SOLN, Take 3 mLs by nebulization every 6 (six) hours as needed., Disp: 360 mL, Rfl: 11   Melatonin 10 MG TABS, Take 1 tablet by mouth at bedtime., Disp: , Rfl:    Multiple Vitamins-Minerals (ONE-A-DAY WOMENS) tablet, Take 1  tablet by mouth daily., Disp: , Rfl:    nitroGLYCERIN (NITROSTAT) 0.4 MG SL tablet, Place 1 tablet (0.4 mg total) under the tongue every 5 (five) minutes as needed for chest pain., Disp: 30 tablet, Rfl: 2   nystatin (MYCOSTATIN/NYSTOP) powder, Apply 1 application. topically 3 (three) times daily., Disp: 30 g, Rfl: 0   ondansetron (ZOFRAN) 4 MG tablet, Take 1 tablet (4 mg total) by mouth every 8 (eight) hours as needed for nausea or vomiting., Disp: 60 tablet, Rfl: 1   pantoprazole (PROTONIX) 40 MG tablet, Take 1 tablet (40 mg total) by mouth daily., Disp: 90 tablet, Rfl: 2   polyethylene glycol powder (MIRALAX) 17 GM/SCOOP powder, Take 17 g  by mouth daily as needed (constipation)., Disp: 255 g, Rfl: 0   PROAIR HFA 108 (90 Base) MCG/ACT inhaler, INHALE 1 TO 2 PUFFS BY MOUTH EVERY 6 HOURS AS NEEDED FOR WHEEZING OR SHORTNESS OF BREATH (Patient taking differently: Inhale 2 puffs into the lungs every 6 (six) hours as needed for wheezing or shortness of breath.), Disp: 8.5 g, Rfl: 4   rOPINIRole (REQUIP) 1 MG tablet, Take 1 tablet (1 mg total) by mouth at bedtime., Disp: 90 tablet, Rfl: 1   rosuvastatin (CRESTOR) 40 MG tablet, Take 1 tablet (40 mg total) by mouth daily., Disp: 90 tablet, Rfl: 3   SYMBICORT 160-4.5 MCG/ACT inhaler, INHALE 2 PUFFS into THE lungs 2 TIMES DAILY (Patient taking differently: Inhale 2 puffs into the lungs 2 (two) times daily.), Disp: 10.2 g, Rfl: 4   torsemide (DEMADEX) 20 MG tablet, Take 4 tablets (80 mg total) by mouth 2 (two) times daily. (Patient taking differently: Take 20 mg by mouth 2 (two) times daily.), Disp: 720 tablet, Rfl: 3   traZODone (DESYREL) 100 MG tablet, Take 1 tablet (100 mg total) by mouth at bedtime as needed. for sleep, Disp: 30 tablet, Rfl: 1   Vitamin D, Cholecalciferol, 25 MCG (1000 UT) CAPS, Take 1,000 Units by mouth daily., Disp: 90 capsule, Rfl: 3  Social History   Tobacco Use  Smoking Status Former   Packs/day: 0.25   Years: 15.00   Total pack  years: 3.75   Types: Cigarettes   Quit date: 12/06/2005   Years since quitting: 17.1  Smokeless Tobacco Never  Tobacco Comments   smokes for a couple of months    Allergies  Allergen Reactions   Cefepime Other (See Comments)    AKI, see records from [facility] hospitalization in January 2020.   Other reaction(s): Other (See Comments) "Shut down organs/kidneys"  Note pt has tolerated Rocephin and Keflex Other reaction(s): Not available   Iodine Other (See Comments)    Kidney dysfunction   Kiwi Extract Shortness Of Breath, Swelling and Anaphylaxis    Other reaction(s): Not available   Morphine And Related Nausea And Vomiting   Nalbuphine Other (See Comments)    Hallucinations   Other reaction(s): Not available   Pentoxifylline Nausea And Vomiting    Other reaction(s): Not available   Cephalosporins Other (See Comments)    Pt states that they cause her kidneys to shut down   Toradol [Ketorolac Tromethamine] Other (See Comments)    Feels like something is crawling on me   Morphine Nausea And Vomiting    Other reaction(s): Not available   Nubain [Nalbuphine Hcl] Other (See Comments)    "FEELS LIKE SOMETHING CRAWLING ON ME"   Objective:  There were no vitals filed for this visit. There is no height or weight on file to calculate BMI. Constitutional Well developed. Well nourished.  Vascular Dorsalis pedis pulses palpable check.  BKA to the left Posterior tibial pulses palpable right side Unable to assess cap refill No cyanosis or clubbing noted. Pedal hair growth present  Neurologic Normal speech. Oriented to person, place, and time. Epicritic sensation to light touch grossly present bilaterally.  Dermatologic Preulcerative callus/pressure ulcer stage I noted to the right posterior heel.  No purulent drainage noted no malodor present.  Does not probe down to deep tissue.  Limited to the breakdown of the skin.  Orthopedic: Weak gait to the left side transmetatarsal amputation to  the right side.   Radiographs: None Assessment:   1. Xerosis of skin  2. Pressure injury of right heel, stage 1   3. Type 2 diabetes mellitus with diabetic neuropathy, with long-term current use of insulin ([place])    Plan:  Patient was evaluated and treated and all questions answered.  Right preulcerative/posterior heel pressure ulcer/decubitus ulcer stage I -All questions and concerns were discussed with the patient in extensive detail.  I discussed aggressive offloading and limiting use of the leg to for movement.  She states understanding will try her best.  Given that she has below the knee amputation to the left side of transmetatarsal amputation to the right side it seems to be very difficult to do. -She will try to offload as much as possible -Betadine wet-to-dry dressing for now  Xerosis skin -I explained to the patient the etiology of xerosis and various treatment options were extensively discussed.  I explained to the patient the importance of maintaining moisturization of the skin with application of over-the-counter lotion such as Eucerin or Luciderm.  Given that he has failed over-the-counter option he will benefit from ammonium lactate advised him apply it twice a day she states understand will do so.    No follow-ups on file.  Xerosis ammonium lactate  Right heel pressure ulcer limited to the breakdown of skin Betadine wet-to-dry dressing aggressive offloading

## 2023-01-21 DIAGNOSIS — E113513 Type 2 diabetes mellitus with proliferative diabetic retinopathy with macular edema, bilateral: Secondary | ICD-10-CM | POA: Diagnosis not present

## 2023-01-21 DIAGNOSIS — Z885 Allergy status to narcotic agent status: Secondary | ICD-10-CM | POA: Diagnosis not present

## 2023-01-21 DIAGNOSIS — Z882 Allergy status to sulfonamides status: Secondary | ICD-10-CM | POA: Diagnosis not present

## 2023-01-21 DIAGNOSIS — Z87891 Personal history of nicotine dependence: Secondary | ICD-10-CM | POA: Diagnosis not present

## 2023-01-21 DIAGNOSIS — H3563 Retinal hemorrhage, bilateral: Secondary | ICD-10-CM | POA: Diagnosis not present

## 2023-01-21 DIAGNOSIS — H4053X4 Glaucoma secondary to other eye disorders, bilateral, indeterminate stage: Secondary | ICD-10-CM | POA: Diagnosis not present

## 2023-01-21 DIAGNOSIS — Z961 Presence of intraocular lens: Secondary | ICD-10-CM | POA: Diagnosis not present

## 2023-01-21 DIAGNOSIS — Z886 Allergy status to analgesic agent status: Secondary | ICD-10-CM | POA: Diagnosis not present

## 2023-01-21 DIAGNOSIS — Z881 Allergy status to other antibiotic agents status: Secondary | ICD-10-CM | POA: Diagnosis not present

## 2023-01-21 DIAGNOSIS — J449 Chronic obstructive pulmonary disease, unspecified: Secondary | ICD-10-CM | POA: Diagnosis not present

## 2023-01-21 DIAGNOSIS — Z7985 Long-term (current) use of injectable non-insulin antidiabetic drugs: Secondary | ICD-10-CM | POA: Diagnosis not present

## 2023-01-21 DIAGNOSIS — H1820 Unspecified corneal edema: Secondary | ICD-10-CM | POA: Diagnosis not present

## 2023-01-21 DIAGNOSIS — Z794 Long term (current) use of insulin: Secondary | ICD-10-CM | POA: Diagnosis not present

## 2023-01-27 IMAGING — CR DG FOOT COMPLETE 3+V*R*
4 series · 4 of 4 positions shown · non-contrast
Comparison: None.

CLINICAL DATA: Right foot wound

EXAM:
RIGHT FOOT COMPLETE - 3+ VIEW

[foot obl]
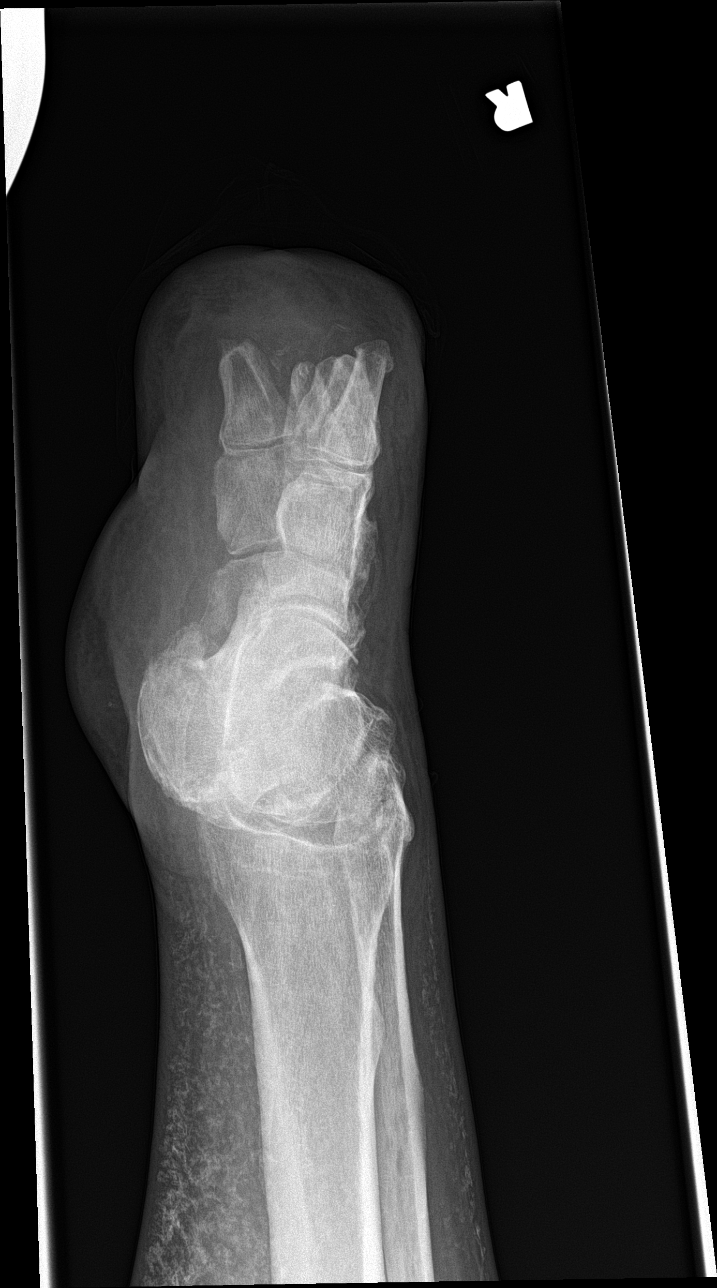

[foot ap (1 of 2)]
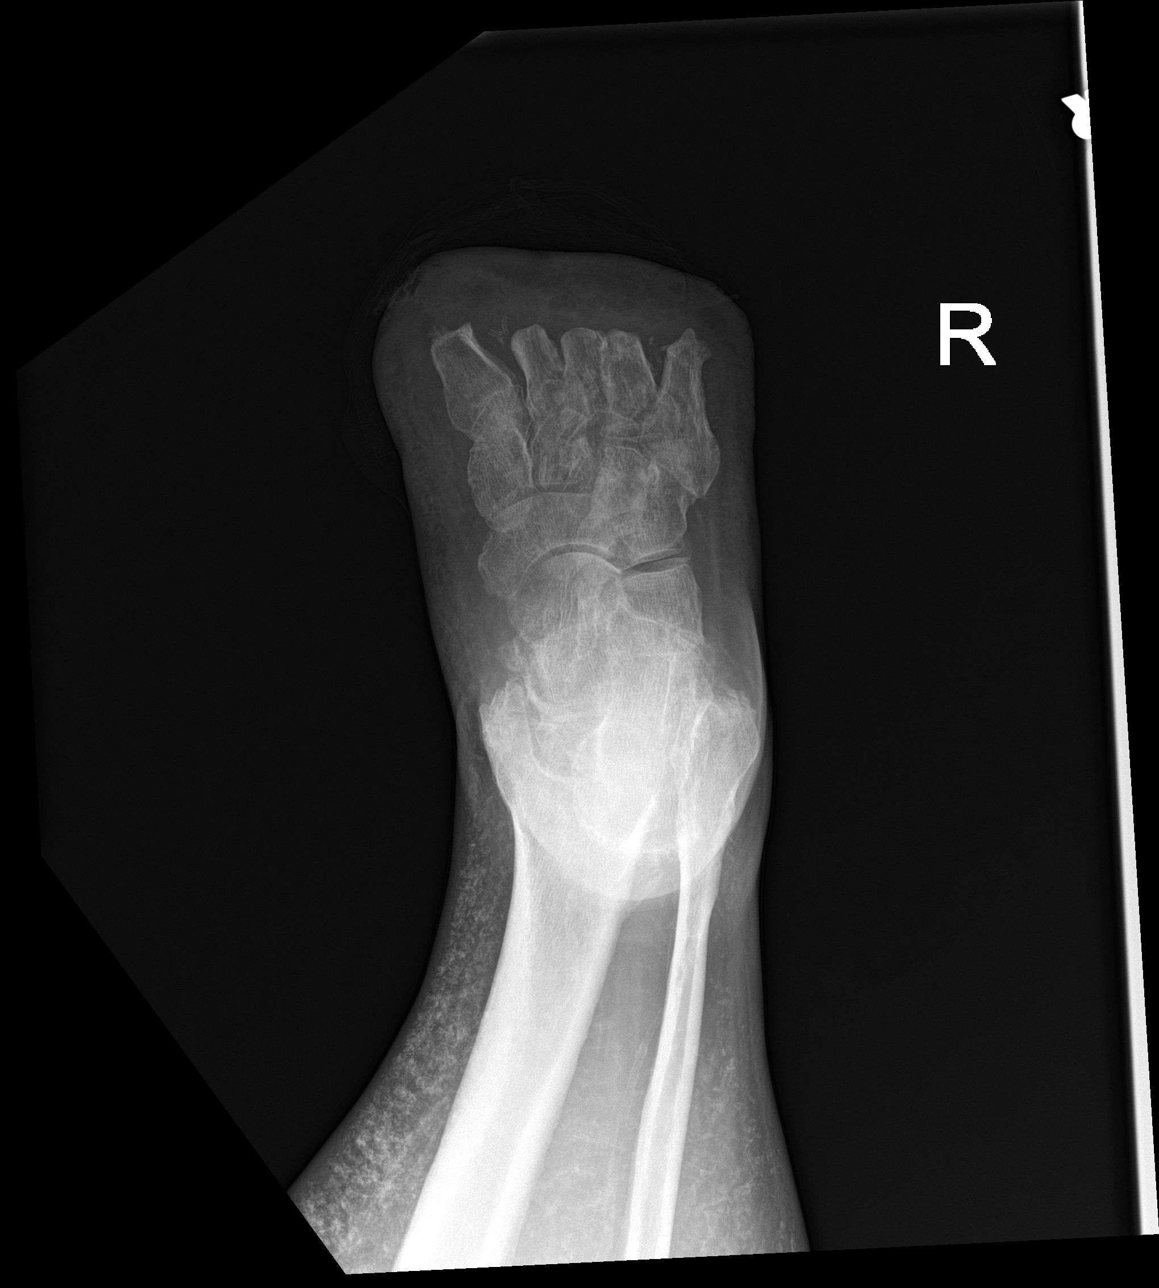

[foot lat]
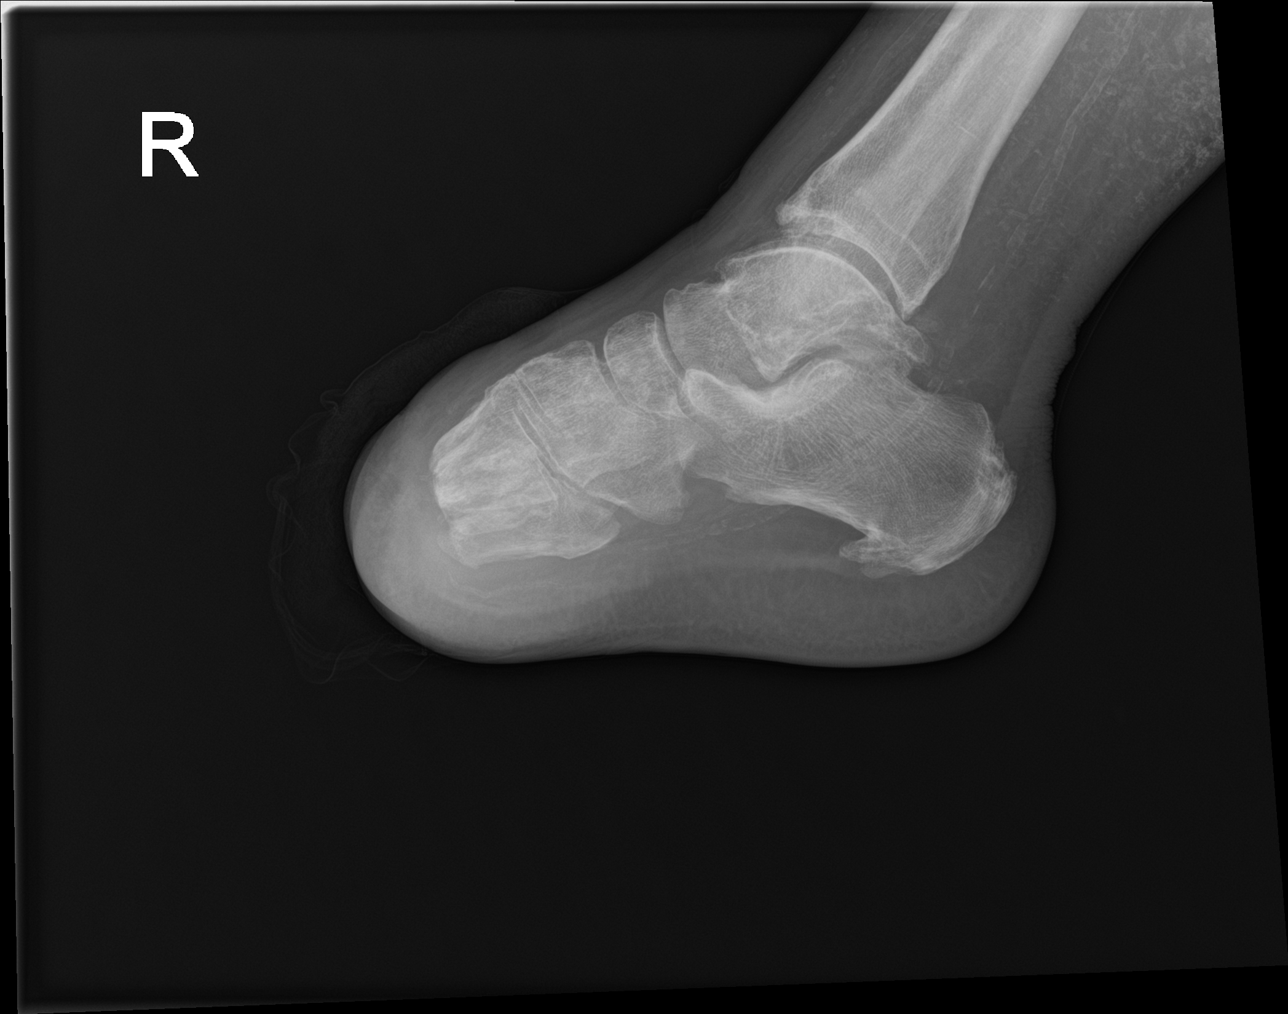

[foot ap (2 of 2)]
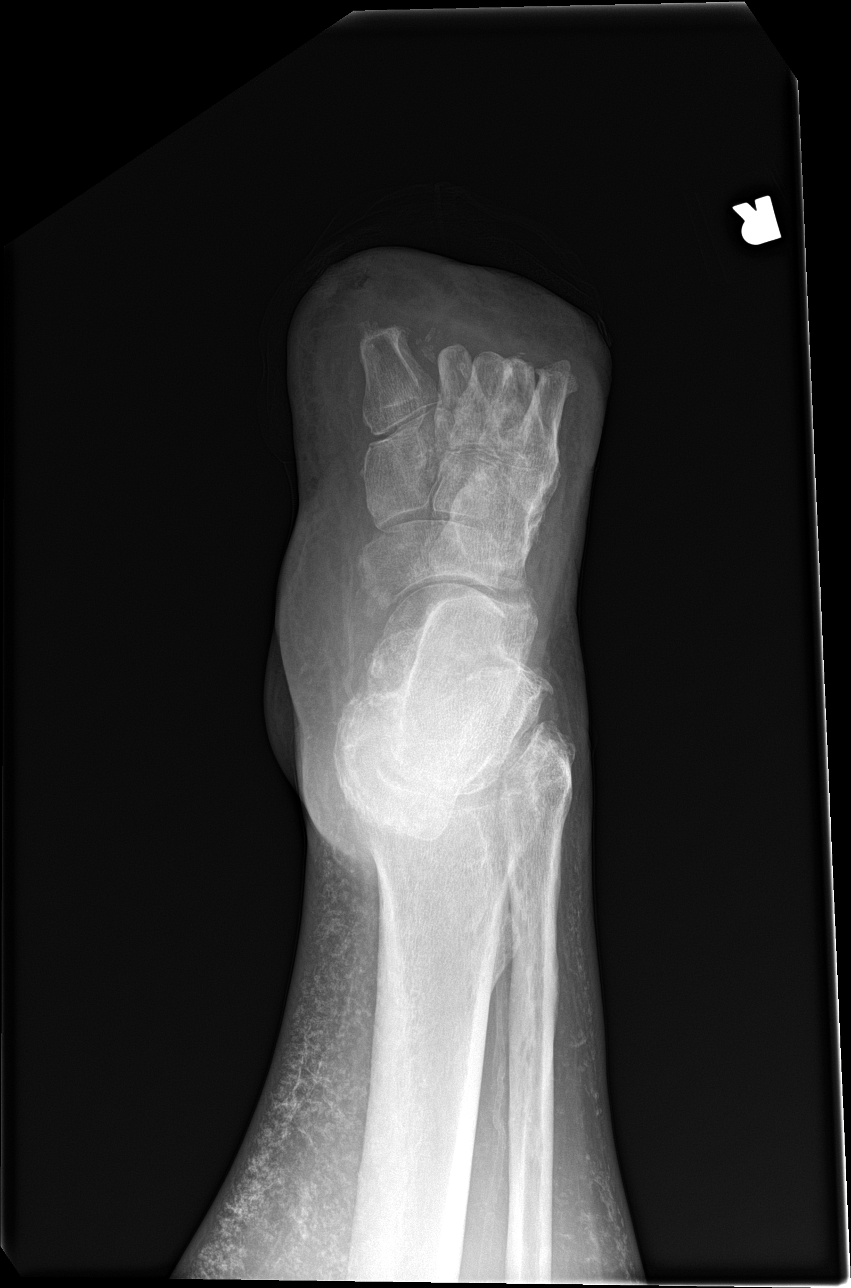

[4 of 4 positions shown; findings below may reference images not displayed]

FINDINGS: Transmetatarsal amputation has been performed. No acute fracture or
dislocation. Moderate plantar calcaneal spur. There is diffuse soft
tissue swelling of the right forefoot. Small soft tissue ulcer noted
distal to the residual first metatarsal. No subjacent osseous
erosion to suggest osteomyelitis. Advanced vascular calcifications
are noted. Subcutaneous calcifications within the right lower
extremity may reflect changes related to venous stasis.
IMPRESSION: Peripheral soft tissue swelling and stump ulceration. No definite
evidence of osteomyelitis.

## 2023-01-28 IMAGING — MR MR FOOT*R* WO/W CM
4 of 9 series · 18 of 40 positions shown · IV contrast (Yes GAD)
Comparison: X-ray 12/18/2021

CLINICAL DATA: Diabetic right foot wound. Concern for osteomyelitis

EXAM:
MRI OF THE RIGHT FOREFOOT WITHOUT AND WITH CONTRAST
TECHNIQUE: Multiplanar, multisequence MR imaging of the right forefoot was
performed before and after the administration of intravenous
contrast.
CONTRAST:  14mL GADAVIST GADOBUTROL 1 MMOL/ML IV SOLN

[Series 5: T1 · axial · 3.0mm · 0.31mm/px · z∈[-170,-71]mm · 5 of 35 slices shown (1 of 2)]
[im 1/35]
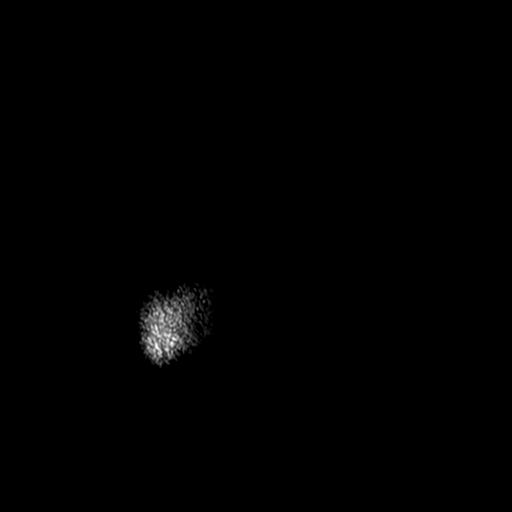
[im 9/35]
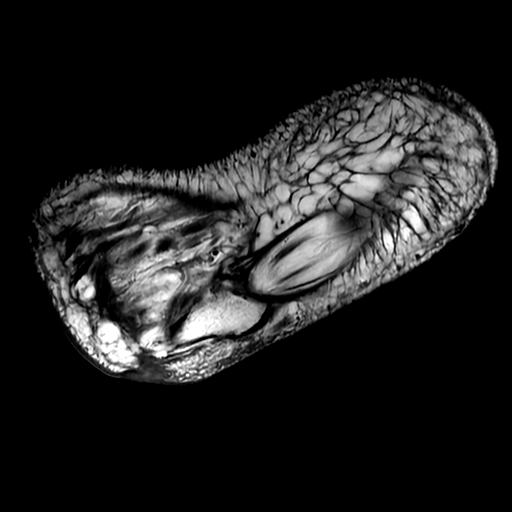
[im 18/35]
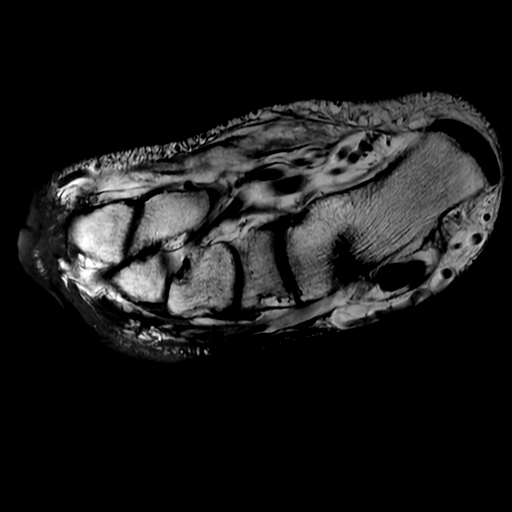
[im 26/35]
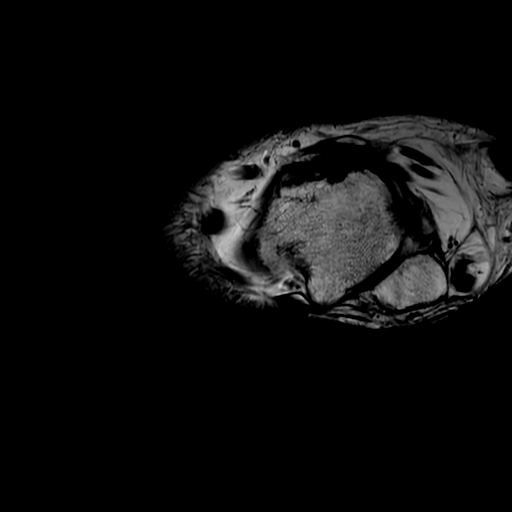
[im 35/35]
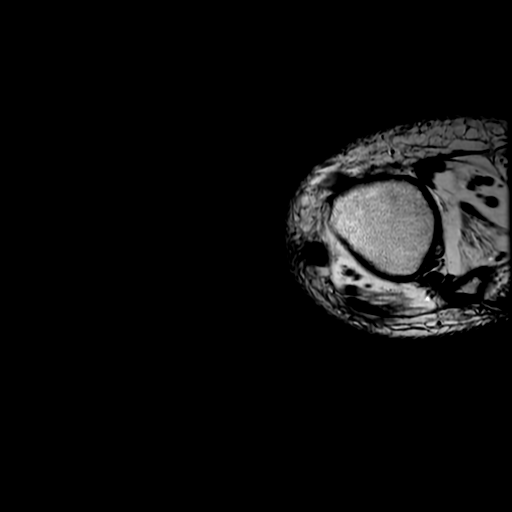

[Series 6: T1 fat-sat · axial · non-contrast · 3.0mm · 0.31mm/px · z∈[-149,-39]mm · 5 of 35 slices shown]
[im 1/35]
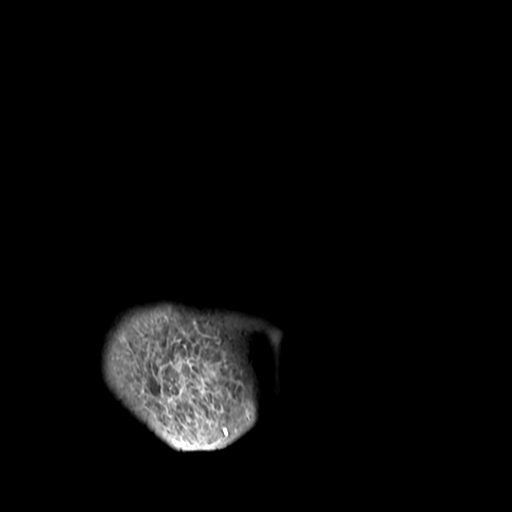
[im 9/35]
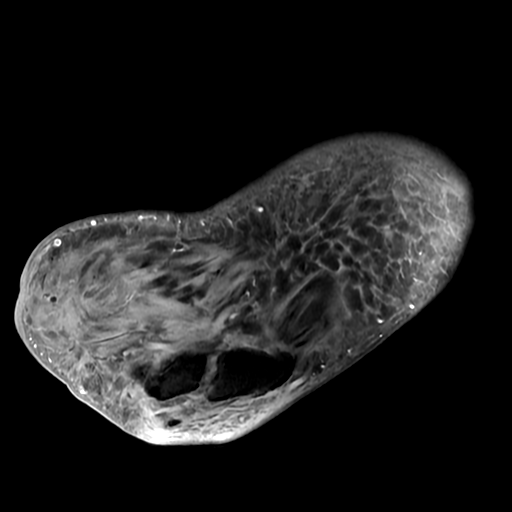
[im 18/35]
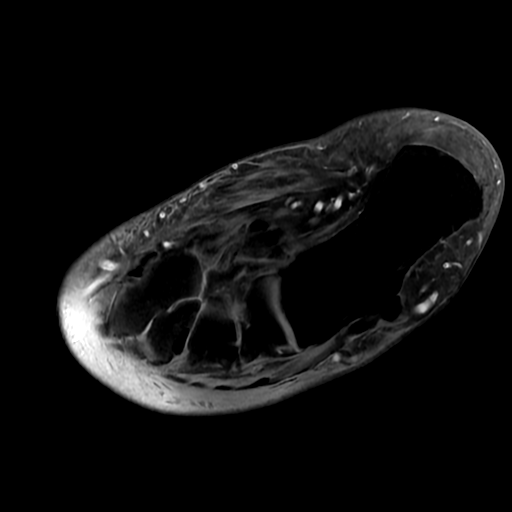
[im 26/35]
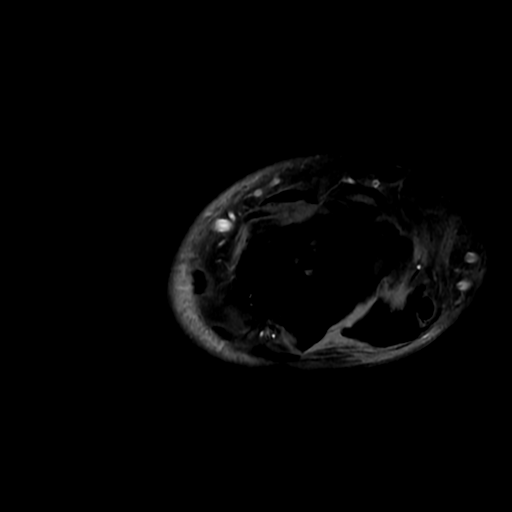
[im 35/35]
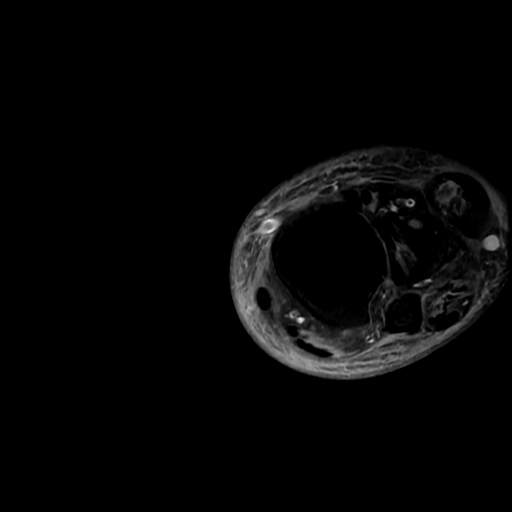

[Series 7: T2 fat-sat · axial · 3.0mm · 0.31mm/px · z∈[-149,-39]mm · 4 of 35 slices shown]
[im 1/35]
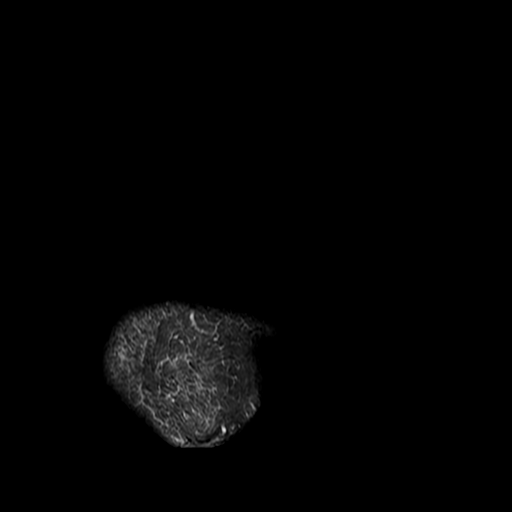
[im 12/35]
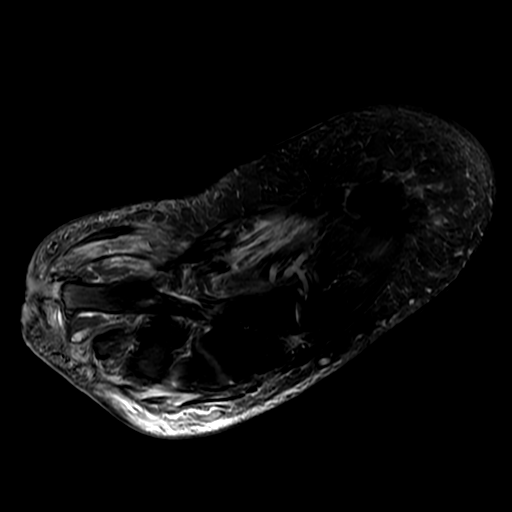
[im 23/35]
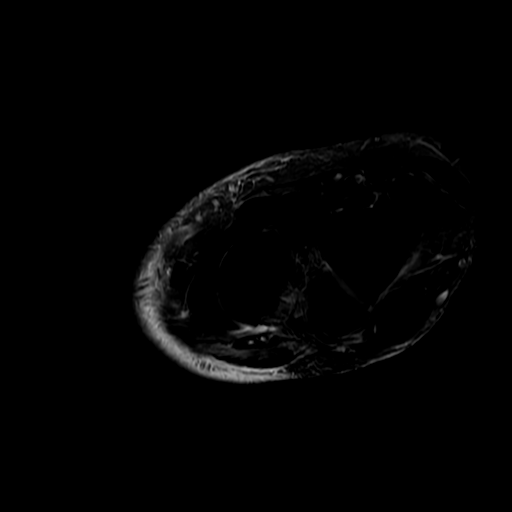
[im 35/35]
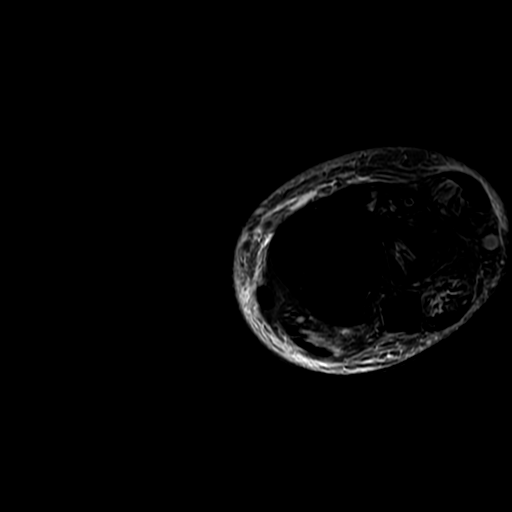

[Series 9: T1 · oblique · 3.0mm · 0.31mm/px · 4 of 47 slices shown (2 of 2)]
[im 1/47]
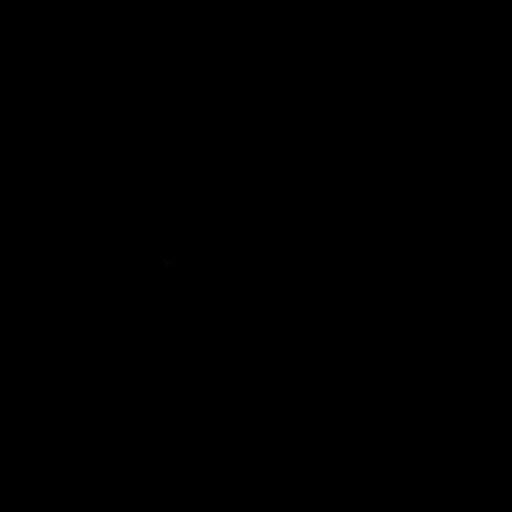
[im 12/47]
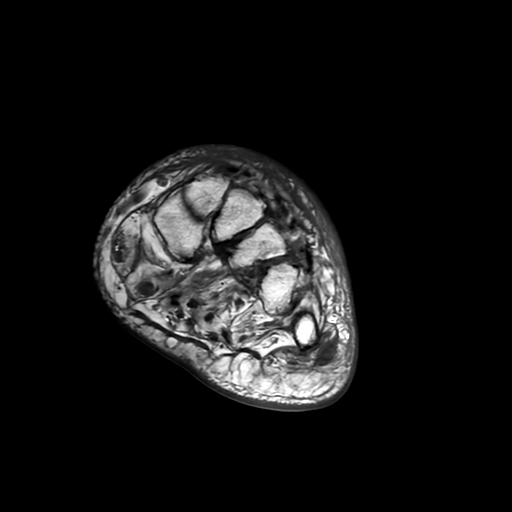
[im 24/47]
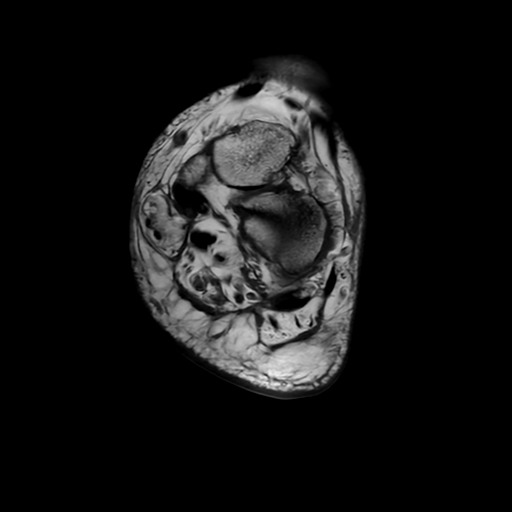
[im 47/47]
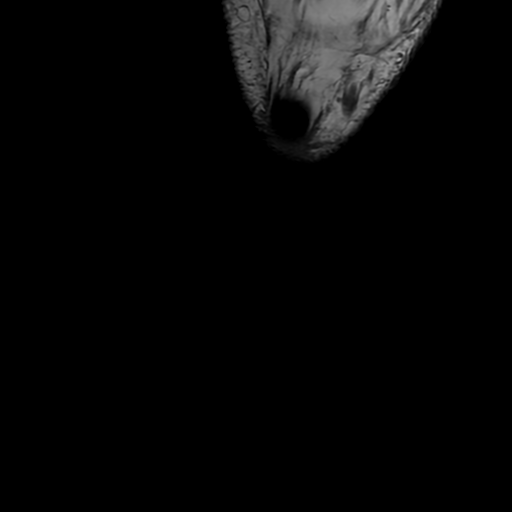

[18 of 40 positions shown; findings below may reference images not displayed]

FINDINGS: Technical Note: Despite efforts by the technologist and patient,
motion artifact is present on today's exam and could not be
eliminated. This reduces exam sensitivity and specificity.

Bones/Joint/Cartilage

Status post transmetatarsal amputation of the first through fifth
rays. There is subtle bone marrow edema and enhancement along the
resection margin of the first metatarsal (series 13, image 15),
which closely approximates the site of overlying skin wound or
ulceration. No confluent low T1 signal changes at this location. No
focal bone destruction. Bone marrow signal throughout the remaining
osseous structures of the foot and ankle is maintained. No
additional sites of bone marrow edema. No fracture or dislocation.

Ligaments

Lisfranc ligament intact.

Muscles and Tendons

Post amputation changes of the musculotendinous structures. Diffuse
fatty atrophy of the musculature compatible with chronic
denervation.

Soft tissues

Soft tissue ulceration with enhancing margins at the medial aspect
of the distal stump with ulcer base closely approximating the
resection margin of the residual first metatarsal. There is
surrounding soft tissue edema and enhancement compatible cellulitis.
Nonspecific subcutaneous edema over the dorsum of the foot. No
organized or rim enhancing fluid collection.
IMPRESSION: 1. Soft tissue ulceration at the medial aspect of the distal stump
closely approximating the resection margin of the residual first
metatarsal. Mild bone marrow edema and enhancement within the
residual first metatarsal distally is suspicious for early acute
osteomyelitis.
2. No organized or rim-enhancing fluid collection.

## 2023-01-31 ENCOUNTER — Ambulatory Visit: Payer: Medicaid Other | Admitting: Family Medicine

## 2023-02-04 DIAGNOSIS — J45909 Unspecified asthma, uncomplicated: Secondary | ICD-10-CM | POA: Diagnosis not present

## 2023-02-04 DIAGNOSIS — E669 Obesity, unspecified: Secondary | ICD-10-CM | POA: Diagnosis not present

## 2023-02-04 DIAGNOSIS — G4733 Obstructive sleep apnea (adult) (pediatric): Secondary | ICD-10-CM | POA: Diagnosis not present

## 2023-02-04 DIAGNOSIS — M86172 Other acute osteomyelitis, left ankle and foot: Secondary | ICD-10-CM | POA: Diagnosis not present

## 2023-02-07 ENCOUNTER — Ambulatory Visit: Payer: Medicaid Other | Admitting: Family Medicine

## 2023-02-15 ENCOUNTER — Ambulatory Visit: Payer: Medicaid Other | Admitting: Podiatry

## 2023-02-15 DIAGNOSIS — Z89512 Acquired absence of left leg below knee: Secondary | ICD-10-CM | POA: Diagnosis not present

## 2023-02-15 DIAGNOSIS — R32 Unspecified urinary incontinence: Secondary | ICD-10-CM | POA: Diagnosis not present

## 2023-02-15 DIAGNOSIS — E119 Type 2 diabetes mellitus without complications: Secondary | ICD-10-CM | POA: Diagnosis not present

## 2023-02-16 ENCOUNTER — Ambulatory Visit: Payer: Medicaid Other | Attending: Podiatry

## 2023-02-20 ENCOUNTER — Inpatient Hospital Stay (HOSPITAL_COMMUNITY)
Admission: EM | Admit: 2023-02-20 | Discharge: 2023-02-26 | DRG: 291 | Disposition: A | Discharge status: Home / Self Care | Payer: Medicaid Other | Attending: Family Medicine | Admitting: Family Medicine

## 2023-02-20 ENCOUNTER — Emergency Department (HOSPITAL_COMMUNITY): Payer: Medicaid Other

## 2023-02-20 ENCOUNTER — Encounter (HOSPITAL_COMMUNITY): Payer: Self-pay

## 2023-02-20 ENCOUNTER — Other Ambulatory Visit: Payer: Self-pay

## 2023-02-20 DIAGNOSIS — I509 Heart failure, unspecified: Secondary | ICD-10-CM

## 2023-02-20 DIAGNOSIS — G9341 Metabolic encephalopathy: Secondary | ICD-10-CM | POA: Diagnosis present

## 2023-02-20 DIAGNOSIS — Z89411 Acquired absence of right great toe: Secondary | ICD-10-CM

## 2023-02-20 DIAGNOSIS — Z89512 Acquired absence of left leg below knee: Secondary | ICD-10-CM

## 2023-02-20 DIAGNOSIS — E785 Hyperlipidemia, unspecified: Secondary | ICD-10-CM | POA: Diagnosis present

## 2023-02-20 DIAGNOSIS — Z9102 Food additives allergy status: Secondary | ICD-10-CM

## 2023-02-20 DIAGNOSIS — R443 Hallucinations, unspecified: Secondary | ICD-10-CM | POA: Diagnosis not present

## 2023-02-20 DIAGNOSIS — N1831 Chronic kidney disease, stage 3a: Secondary | ICD-10-CM | POA: Diagnosis present

## 2023-02-20 DIAGNOSIS — J9602 Acute respiratory failure with hypercapnia: Secondary | ICD-10-CM | POA: Diagnosis present

## 2023-02-20 DIAGNOSIS — J9622 Acute and chronic respiratory failure with hypercapnia: Secondary | ICD-10-CM | POA: Diagnosis present

## 2023-02-20 DIAGNOSIS — R0602 Shortness of breath: Secondary | ICD-10-CM | POA: Diagnosis present

## 2023-02-20 DIAGNOSIS — Z833 Family history of diabetes mellitus: Secondary | ICD-10-CM

## 2023-02-20 DIAGNOSIS — J9621 Acute and chronic respiratory failure with hypoxia: Secondary | ICD-10-CM | POA: Diagnosis present

## 2023-02-20 DIAGNOSIS — Z809 Family history of malignant neoplasm, unspecified: Secondary | ICD-10-CM

## 2023-02-20 DIAGNOSIS — K219 Gastro-esophageal reflux disease without esophagitis: Secondary | ICD-10-CM | POA: Diagnosis present

## 2023-02-20 DIAGNOSIS — H4010X0 Unspecified open-angle glaucoma, stage unspecified: Secondary | ICD-10-CM | POA: Diagnosis present

## 2023-02-20 DIAGNOSIS — E8729 Other acidosis: Secondary | ICD-10-CM | POA: Diagnosis present

## 2023-02-20 DIAGNOSIS — Z7982 Long term (current) use of aspirin: Secondary | ICD-10-CM

## 2023-02-20 DIAGNOSIS — Z1152 Encounter for screening for COVID-19: Secondary | ICD-10-CM

## 2023-02-20 DIAGNOSIS — I130 Hypertensive heart and chronic kidney disease with heart failure and stage 1 through stage 4 chronic kidney disease, or unspecified chronic kidney disease: Principal | ICD-10-CM | POA: Diagnosis present

## 2023-02-20 DIAGNOSIS — E662 Morbid (severe) obesity with alveolar hypoventilation: Secondary | ICD-10-CM | POA: Diagnosis present

## 2023-02-20 DIAGNOSIS — E1122 Type 2 diabetes mellitus with diabetic chronic kidney disease: Secondary | ICD-10-CM | POA: Diagnosis present

## 2023-02-20 DIAGNOSIS — I2510 Atherosclerotic heart disease of native coronary artery without angina pectoris: Secondary | ICD-10-CM | POA: Diagnosis present

## 2023-02-20 DIAGNOSIS — Z83438 Family history of other disorder of lipoprotein metabolism and other lipidemia: Secondary | ICD-10-CM

## 2023-02-20 DIAGNOSIS — F419 Anxiety disorder, unspecified: Secondary | ICD-10-CM | POA: Diagnosis present

## 2023-02-20 DIAGNOSIS — J4489 Other specified chronic obstructive pulmonary disease: Secondary | ICD-10-CM | POA: Diagnosis present

## 2023-02-20 DIAGNOSIS — Z818 Family history of other mental and behavioral disorders: Secondary | ICD-10-CM

## 2023-02-20 DIAGNOSIS — Z885 Allergy status to narcotic agent status: Secondary | ICD-10-CM

## 2023-02-20 DIAGNOSIS — I5033 Acute on chronic diastolic (congestive) heart failure: Secondary | ICD-10-CM | POA: Diagnosis present

## 2023-02-20 DIAGNOSIS — Z794 Long term (current) use of insulin: Secondary | ICD-10-CM

## 2023-02-20 DIAGNOSIS — E1165 Type 2 diabetes mellitus with hyperglycemia: Secondary | ICD-10-CM | POA: Diagnosis present

## 2023-02-20 DIAGNOSIS — E1142 Type 2 diabetes mellitus with diabetic polyneuropathy: Secondary | ICD-10-CM | POA: Diagnosis present

## 2023-02-20 DIAGNOSIS — Z8249 Family history of ischemic heart disease and other diseases of the circulatory system: Secondary | ICD-10-CM

## 2023-02-20 DIAGNOSIS — Z7951 Long term (current) use of inhaled steroids: Secondary | ICD-10-CM

## 2023-02-20 DIAGNOSIS — Z9981 Dependence on supplemental oxygen: Secondary | ICD-10-CM

## 2023-02-20 DIAGNOSIS — N179 Acute kidney failure, unspecified: Secondary | ICD-10-CM | POA: Diagnosis present

## 2023-02-20 DIAGNOSIS — Z91148 Patient's other noncompliance with medication regimen for other reason: Secondary | ICD-10-CM

## 2023-02-20 DIAGNOSIS — Z87891 Personal history of nicotine dependence: Secondary | ICD-10-CM

## 2023-02-20 DIAGNOSIS — Z6841 Body Mass Index (BMI) 40.0 and over, adult: Secondary | ICD-10-CM

## 2023-02-20 DIAGNOSIS — E875 Hyperkalemia: Secondary | ICD-10-CM | POA: Diagnosis not present

## 2023-02-20 DIAGNOSIS — J9600 Acute respiratory failure, unspecified whether with hypoxia or hypercapnia: Secondary | ICD-10-CM | POA: Diagnosis present

## 2023-02-20 DIAGNOSIS — Z881 Allergy status to other antibiotic agents status: Secondary | ICD-10-CM

## 2023-02-20 DIAGNOSIS — Z8616 Personal history of COVID-19: Secondary | ICD-10-CM

## 2023-02-20 DIAGNOSIS — Z825 Family history of asthma and other chronic lower respiratory diseases: Secondary | ICD-10-CM

## 2023-02-20 DIAGNOSIS — Z79899 Other long term (current) drug therapy: Secondary | ICD-10-CM

## 2023-02-20 LAB — CBC WITH DIFFERENTIAL/PLATELET
Abs Immature Granulocytes: 0.03 10*3/uL (ref 0.00–0.07)
Basophils Absolute: 0.1 10*3/uL (ref 0.0–0.1)
Basophils Relative: 1 %
Eosinophils Absolute: 0.2 10*3/uL (ref 0.0–0.5)
Eosinophils Relative: 2 %
HCT: 41.8 % (ref 36.0–46.0)
Hemoglobin: 12.1 g/dL (ref 12.0–15.0)
Immature Granulocytes: 0 %
Lymphocytes Relative: 44 %
Lymphs Abs: 3.2 10*3/uL (ref 0.7–4.0)
MCH: 27.2 pg (ref 26.0–34.0)
MCHC: 28.9 g/dL — ABNORMAL LOW (ref 30.0–36.0)
MCV: 93.9 fL (ref 80.0–100.0)
Monocytes Absolute: 0.7 10*3/uL (ref 0.1–1.0)
Monocytes Relative: 9 %
Neutro Abs: 3.1 10*3/uL (ref 1.7–7.7)
Neutrophils Relative %: 44 %
Platelets: 245 10*3/uL (ref 150–400)
RBC: 4.45 MIL/uL (ref 3.87–5.11)
RDW: 15.6 % — ABNORMAL HIGH (ref 11.5–15.5)
WBC: 7.2 10*3/uL (ref 4.0–10.5)
nRBC: 0 % (ref 0.0–0.2)

## 2023-02-20 LAB — RESP PANEL BY RT-PCR (RSV, FLU A&B, COVID)  RVPGX2
Influenza A by PCR: NEGATIVE
Influenza B by PCR: NEGATIVE
Resp Syncytial Virus by PCR: NEGATIVE
SARS Coronavirus 2 by RT PCR: NEGATIVE

## 2023-02-20 LAB — BASIC METABOLIC PANEL
Anion gap: 10 (ref 5–15)
BUN: 36 mg/dL — ABNORMAL HIGH (ref 6–20)
CO2: 27 mmol/L (ref 22–32)
Calcium: 9 mg/dL (ref 8.9–10.3)
Chloride: 99 mmol/L (ref 98–111)
Creatinine, Ser: 1.15 mg/dL — ABNORMAL HIGH (ref 0.44–1.00)
GFR, Estimated: 60 mL/min — ABNORMAL LOW (ref >60)
Glucose, Bld: 382 mg/dL — ABNORMAL HIGH (ref 70–99)
Potassium: 4.8 mmol/L (ref 3.5–5.1)
Sodium: 136 mmol/L (ref 135–145)

## 2023-02-20 LAB — TROPONIN I (HIGH SENSITIVITY)
Troponin I (High Sensitivity): 20 ng/L — ABNORMAL HIGH (ref <18)
Troponin I (High Sensitivity): 19 ng/L — ABNORMAL HIGH (ref <18)

## 2023-02-20 LAB — CBG MONITORING, ED
Glucose-Capillary: 317 mg/dL — ABNORMAL HIGH (ref 70–99)
Glucose-Capillary: 318 mg/dL — ABNORMAL HIGH (ref 70–99)

## 2023-02-20 LAB — URINALYSIS, ROUTINE W REFLEX MICROSCOPIC
Bacteria, UA: NONE SEEN
Bilirubin Urine: NEGATIVE
Glucose, UA: >=500 mg/dL — AB
Hgb urine dipstick: NEGATIVE
Ketones, ur: NEGATIVE mg/dL
Leukocytes,Ua: NEGATIVE
Nitrite: NEGATIVE
Protein, ur: 100 mg/dL — AB
Specific Gravity, Urine: 1.016 (ref 1.005–1.030)
pH: 5 (ref 5.0–8.0)

## 2023-02-20 LAB — BRAIN NATRIURETIC PEPTIDE: B Natriuretic Peptide: 39.1 pg/mL (ref 0.0–100.0)

## 2023-02-20 LAB — GLUCOSE, CAPILLARY
Glucose-Capillary: 274 mg/dL — ABNORMAL HIGH (ref 70–99)
Glucose-Capillary: 274 mg/dL — ABNORMAL HIGH (ref 70–99)

## 2023-02-20 MED ORDER — OXYCODONE HCL 5 MG PO TABS
5.0000 mg | ORAL_TABLET | Freq: Once | ORAL | Status: AC
  Administered 2023-02-20: 5 mg via ORAL
  Filled 2023-02-20: qty 1

## 2023-02-20 MED ORDER — DULOXETINE HCL 60 MG PO CPEP
120.0000 mg | ORAL_CAPSULE | Freq: Every day | ORAL | Status: DC
  Administered 2023-02-20 – 2023-02-26 (×6): 120 mg via ORAL
  Filled 2023-02-20: qty 4
  Filled 2023-02-20 (×4): qty 2
  Filled 2023-02-20: qty 4

## 2023-02-20 MED ORDER — MOMETASONE FURO-FORMOTEROL FUM 200-5 MCG/ACT IN AERO
2.0000 | INHALATION_SPRAY | Freq: Two times a day (BID) | RESPIRATORY_TRACT | Status: DC
  Administered 2023-02-20 – 2023-02-21 (×2): 2 via RESPIRATORY_TRACT
  Filled 2023-02-20: qty 8.8

## 2023-02-20 MED ORDER — ASPIRIN 81 MG PO TBEC
81.0000 mg | DELAYED_RELEASE_TABLET | Freq: Every day | ORAL | Status: DC
  Administered 2023-02-20 – 2023-02-26 (×6): 81 mg via ORAL
  Filled 2023-02-20 (×6): qty 1

## 2023-02-20 MED ORDER — ENOXAPARIN SODIUM 80 MG/0.8ML IJ SOSY
0.5000 mg/kg | PREFILLED_SYRINGE | INTRAMUSCULAR | Status: DC
  Administered 2023-02-20 – 2023-02-21 (×2): 77.5 mg via SUBCUTANEOUS
  Filled 2023-02-20: qty 0.8
  Filled 2023-02-20: qty 0.78

## 2023-02-20 MED ORDER — INSULIN GLARGINE-YFGN 100 UNIT/ML ~~LOC~~ SOLN: 45.0000 [IU] | Freq: Two times a day (BID) | SUBCUTANEOUS | Status: DC

## 2023-02-20 MED ORDER — PANTOPRAZOLE SODIUM 40 MG PO TBEC
40.0000 mg | DELAYED_RELEASE_TABLET | Freq: Every day | ORAL | Status: DC
  Administered 2023-02-20 – 2023-02-26 (×6): 40 mg via ORAL
  Filled 2023-02-20 (×6): qty 1

## 2023-02-20 MED ORDER — ALBUTEROL SULFATE (2.5 MG/3ML) 0.083% IN NEBU
5.0000 mg | INHALATION_SOLUTION | Freq: Once | RESPIRATORY_TRACT | Status: AC
  Administered 2023-02-20: 5 mg via RESPIRATORY_TRACT
  Filled 2023-02-20: qty 6

## 2023-02-20 MED ORDER — NETARSUDIL DIMESYLATE 0.02 % OP SOLN: 1.0000 [drp] | Freq: Every day | OPHTHALMIC | Status: DC

## 2023-02-20 MED ORDER — ROPINIROLE HCL 1 MG PO TABS
1.0000 mg | ORAL_TABLET | Freq: Every day | ORAL | Status: DC
  Administered 2023-02-20 – 2023-02-25 (×5): 1 mg via ORAL
  Filled 2023-02-20 (×6): qty 1

## 2023-02-20 MED ORDER — BRIMONIDINE TARTRATE 0.2 % OP SOLN
1.0000 [drp] | Freq: Three times a day (TID) | OPHTHALMIC | Status: DC
  Administered 2023-02-20 – 2023-02-26 (×18): 1 [drp] via OPHTHALMIC
  Filled 2023-02-20 (×2): qty 5

## 2023-02-20 MED ORDER — AMLODIPINE BESYLATE 10 MG PO TABS
10.0000 mg | ORAL_TABLET | Freq: Every day | ORAL | Status: DC
  Administered 2023-02-21 – 2023-02-26 (×5): 10 mg via ORAL
  Filled 2023-02-20 (×4): qty 1
  Filled 2023-02-20: qty 2
  Filled 2023-02-20: qty 1

## 2023-02-20 MED ORDER — LIDOCAINE 5 % EX PTCH
1.0000 | MEDICATED_PATCH | CUTANEOUS | Status: DC
  Administered 2023-02-20 – 2023-02-25 (×4): 1 via TRANSDERMAL
  Filled 2023-02-20 (×6): qty 1

## 2023-02-20 MED ORDER — BRINZOLAMIDE 1 % OP SUSP
1.0000 [drp] | Freq: Three times a day (TID) | OPHTHALMIC | Status: DC
  Administered 2023-02-20 – 2023-02-26 (×18): 1 [drp] via OPHTHALMIC
  Filled 2023-02-20 (×2): qty 10

## 2023-02-20 MED ORDER — FUROSEMIDE 10 MG/ML IJ SOLN
80.0000 mg | Freq: Once | INTRAMUSCULAR | Status: AC
  Administered 2023-02-20: 80 mg via INTRAVENOUS
  Filled 2023-02-20: qty 8

## 2023-02-20 MED ORDER — GABAPENTIN 300 MG PO CAPS
1200.0000 mg | ORAL_CAPSULE | Freq: Three times a day (TID) | ORAL | Status: DC
  Administered 2023-02-20 – 2023-02-21 (×4): 1200 mg via ORAL
  Filled 2023-02-20 (×5): qty 4

## 2023-02-20 MED ORDER — INSULIN GLARGINE-YFGN 100 UNIT/ML ~~LOC~~ SOLN
45.0000 [IU] | Freq: Two times a day (BID) | SUBCUTANEOUS | Status: DC
  Administered 2023-02-20 – 2023-02-21 (×4): 45 [IU] via SUBCUTANEOUS
  Filled 2023-02-20 (×7): qty 0.45

## 2023-02-20 MED ORDER — INSULIN ASPART 100 UNIT/ML IJ SOLN
0.0000 [IU] | Freq: Three times a day (TID) | INTRAMUSCULAR | Status: DC
  Administered 2023-02-20 (×2): 15 [IU] via SUBCUTANEOUS
  Administered 2023-02-20: 11 [IU] via SUBCUTANEOUS
  Administered 2023-02-21 (×2): 7 [IU] via SUBCUTANEOUS
  Administered 2023-02-21 – 2023-02-22 (×2): 4 [IU] via SUBCUTANEOUS

## 2023-02-20 MED ORDER — ROSUVASTATIN CALCIUM 20 MG PO TABS
40.0000 mg | ORAL_TABLET | Freq: Every day | ORAL | Status: DC
  Administered 2023-02-20 – 2023-02-26 (×6): 40 mg via ORAL
  Filled 2023-02-20 (×6): qty 2

## 2023-02-20 MED ORDER — ACETAMINOPHEN 500 MG PO TABS
1000.0000 mg | ORAL_TABLET | Freq: Four times a day (QID) | ORAL | Status: DC | PRN
  Administered 2023-02-20 (×2): 1000 mg via ORAL
  Filled 2023-02-20 (×2): qty 2

## 2023-02-20 MED ORDER — FUROSEMIDE 10 MG/ML IJ SOLN
80.0000 mg | Freq: Two times a day (BID) | INTRAMUSCULAR | Status: DC
  Administered 2023-02-20 (×2): 80 mg via INTRAVENOUS
  Filled 2023-02-20 (×2): qty 8

## 2023-02-20 MED ORDER — NITROGLYCERIN 2 % TD OINT
1.0000 [in_us] | TOPICAL_OINTMENT | Freq: Once | TRANSDERMAL | Status: DC
  Filled 2023-02-20: qty 1

## 2023-02-20 MED ORDER — LATANOPROST 0.005 % OP SOLN
1.0000 [drp] | Freq: Every day | OPHTHALMIC | Status: DC
  Administered 2023-02-20 – 2023-02-25 (×6): 1 [drp] via OPHTHALMIC
  Filled 2023-02-20: qty 2.5

## 2023-02-20 NOTE — Assessment & Plan Note (Addendum)
On Bipap all day yesterday but transitioned to [place] this AM. Now in 4L [place] and doing well. Aox4. States she only uses 2L [place] at home. 1.6L UOP during the day but non charted overnight. Patient reports additional ~3L in [place] this AM. -On 4L [place], wean as tolerated -Transition to Torsemide 100mg  BID -Strict I/Os -Daily Weights

## 2023-02-20 NOTE — Assessment & Plan Note (Addendum)
BGL < 100 but patient has been NPO. Restart diet this AM and monitor closely given improving respiratory status. -CBG monitoring with meals and bedtime -Semglee 30u BID -rSSI

## 2023-02-20 NOTE — ED Notes (Signed)
Lunch tray ordered 

## 2023-02-20 NOTE — ED Notes (Signed)
Unable to urinate. Says she doesn't produce much and went just prior to arrival. Encouraged to notify staff when able.

## 2023-02-20 NOTE — ED Notes (Signed)
Patient denies pain and is resting comfortably.  

## 2023-02-20 NOTE — Evaluation (Signed)
Physical Therapy Evaluation & Discharge Patient Details Name: [name] MRN: [id number] DOB: Sep 09, 1976 Today's Date: 02/20/2023  History of Present Illness  Pt is a 47 y.o. female who presented 02/20/23 with SOB. Suspected to likely be secondary to CHF exacerbation. PMH: type 2 diabetes, morbid obesity, obesity hypoventilation syndrome, OSA wears BiPAP at night, hypertension, asthma, GERD, hyperlipidemia, restless leg syndrome, left BKA, R transmetatarsal amputation, mood disorder, hypertension, history of chest pain   Clinical Impression  Pt presents with condition above. This pt is familiar to this PT from her prior visit to the hospital 11/28/22. Pt reports she currently feels a little weaker than usual but she was able to perform bed mobility with minA, which is how she was functioning 11/28/22. Pt declined attempts to scoot or stand without her prosthesis being present. Provided no home info/set-up/assistance has changed since 11/28/22, pt has good support and assistance provided to her by her family and aide 24/7. Pt was about to start OPPT before this admission. She would benefit from follow-up with OPPT to address her deficits in balance, strength, and activity tolerance to improve her independence with all functional mobility, but as pt has not had an acute change in functional status thus no acute PT needs are identified at this time. PT will sign off.     Recommendations for follow up therapy are one component of a multi-disciplinary discharge planning process, led by the attending physician.  Recommendations may be updated based on patient status, additional functional criteria and insurance authorization.  Follow Up Recommendations Outpatient PT      Assistance Recommended at Discharge Intermittent Supervision/Assistance  Patient can return home with the following  A little help with walking and/or transfers;A little help with bathing/dressing/bathroom;Assistance with  cooking/housework;Assist for transportation;Help with stairs or ramp for entrance    Equipment Recommendations None recommended by PT  Recommendations for Other Services       Functional Status Assessment Patient has not had a recent decline in their functional status     Precautions / Restrictions Precautions Precautions: Fall;Other (comment) Precaution Comments: L BKA, R transmet, watch SpO2 Restrictions Weight Bearing Restrictions: No      Mobility  Bed Mobility Overal bed mobility: Needs Assistance Bed Mobility: Supine to Sit, Sit to Supine     Supine to sit: Min assist, HOB elevated Sit to supine: Min assist, HOB elevated   General bed mobility comments: MinA to lift her R leg onto the bed with transition to supine, minA for pt to pull on PT's hand to ascend trunk and scoot to EOB to sit up    Transfers                   General transfer comment: deferred due to prosthesis not being present, pt reporting she does not stand without it    Ambulation/Gait               General Gait Details: deferred due to prosthesis not being present, pt reporting she does not stand without it  Financial trader [place] (Stroke Patients Only)       Balance Overall balance assessment: Needs assistance Sitting-balance support: No upper extremity supported, Feet supported Sitting balance-Leahy Scale: Normal         Standing balance comment: deferred  Pertinent Vitals/Pain Pain Assessment Pain Assessment: 0-10 Pain Score: 9  Pain Location: L abdomen Pain Descriptors / Indicators: Discomfort, Grimacing, Moaning Pain Intervention(s): Limited activity within patient's tolerance, Monitored during session, Repositioned, Patient requesting pain meds-RN notified    Home Living Family/patient expects to be discharged to:: Private residence Living Arrangements: Children  (daughter) Available Help at Discharge: Family;Personal care attendant;Available 24 hours/day;Friend(s) (aide x7 days per week, 8 hours M-F, 6 hours S & S) Type of Home: Other(Comment) (motel) Home Access: Stairs to enter Entrance Stairs-Rails: None Entrance Stairs-Number of Steps: 1   Home Layout: One level Home Equipment: Conservation officer, nature (2 wheels);Tub bench;Wheelchair - manual;Wheelchair - power;BSC/3in1;Other (comment) (drop arm BSC, lift chair) Additional Comments: info carried over from 11/28/22; on 2-3L O2 (bumps up occasionally as needed); BiPAP at home; sleeps in her lift chair    Prior Function Prior Level of Function : Needs assist (info carried over from 11/28/22)             Mobility Comments: Able to walk short distances with a RW and assistance. Uses w/c for longer distances. Has assistance for all functional mobility. Daughters help her step up the x1 STE or bump her up it in the w/c. ADLs Comments: Medicare transportation; she washes her top half in bed and aide washes bottom half; asisstance for prosthesis donning/doffing     Hand Dominance   Dominant Hand: Right    Extremity/Trunk Assessment   Upper Extremity Assessment Upper Extremity Assessment: Defer to OT evaluation    Lower Extremity Assessment Lower Extremity Assessment: Generalized weakness;RLE deficits/detail;LLE deficits/detail RLE Deficits / Details: hx of R transmet amputation; reports numbness/tingling distally; MMT score of 4+ knee extension; reports hx of drop foot and refused MMT of ankle LLE Deficits / Details: hx of L BKA    Cervical / Trunk Assessment Cervical / Trunk Assessment: Other exceptions Cervical / Trunk Exceptions: increased body habitus  Communication   Communication: No difficulties  Cognition Arousal/Alertness: Awake/alert Behavior During Therapy: Anxious Overall Cognitive Status: Within Functional Limits for tasks assessed                                  General Comments: Pt internally distracted by pain        General Comments General comments (skin integrity, edema, etc.): VSS on 4L O2    Exercises     Assessment/Plan    PT Assessment All further PT needs can be met in the next venue of care  PT Problem List Decreased strength;Decreased activity tolerance;Decreased balance;Decreased mobility;Impaired sensation       PT Treatment Interventions      PT Goals (Current goals can be found in the Care Plan section)  Acute Rehab PT Goals Patient Stated Goal: to start OPPT PT Goal Formulation: All assessment and education complete, DC therapy Time For Goal Achievement: 02/21/23 Potential to Achieve Goals: Good    Frequency       Co-evaluation               AM-PAC PT "6 Clicks" Mobility  Outcome Measure Help needed turning from your back to your side while in a flat bed without using bedrails?: A Little Help needed moving from lying on your back to sitting on the side of a flat bed without using bedrails?: A Little Help needed moving to and from a bed to a chair (including a wheelchair)?: A Lot Help needed standing up from  a chair using your arms (e.g., wheelchair or bedside chair)?: A Lot Help needed to walk in hospital room?: Total Help needed climbing 3-5 steps with a railing? : Total 6 Click Score: 12    End of Session Equipment Utilized During Treatment: Oxygen Activity Tolerance: Patient tolerated treatment well Patient left: in bed;with call bell/phone within reach Nurse Communication: Mobility status;Patient requests pain meds PT Visit Diagnosis: Muscle weakness (generalized) (M62.81)    Time: [id number] PT Time Calculation (min) (ACUTE ONLY): 11 min   Charges:   PT Evaluation $PT Eval Moderate Complexity: 1 Mod          Moishe Spice, PT, DPT Acute Rehabilitation Services  Office: [phone]   Orvan Falconer 02/20/2023, 12:53 PM

## 2023-02-20 NOTE — Progress Notes (Signed)
FMTS Interim Progress Note  Evaluated patient at bedside with Dr. Marcina Millard.  Patient resting comfortably in no acute distress. Did not awaken for exam.   Vitals:   02/20/23 1454 02/20/23 1551 02/20/23 2050 02/20/23 2100  BP:  110/68 (!) 104/52   Pulse:  93 92 95  Temp: 98.2 F (36.8 C) 98.1 F (36.7 C) 98 F (36.7 C)   Resp:  18 20 (!) 22  Height:      Weight:      SpO2:   92% 96%  TempSrc: Oral Oral Oral   BMI (Calculated):        Shortness of breath: -BiPAP on at night -Lasix per day team -Output 1500 cc  L Sided Pain:  - s/p 5 mg Oxycodone 3/18 -Tylenol 1000 mg every 6 hours PRN, can schedule if needed   Darci Current, DO 02/20/2023, 10:17 PM PGY-1, [facility] pager [phone]

## 2023-02-20 NOTE — ED Triage Notes (Signed)
Arrives EMS from home with worsening shob throughout the day. Diminished fields per paramedic.   Has a home BIPAP and has felt unable to breathe without it.   Hx COPD and CHF.   PTA tx 5mg  albuterol  0.5mg  Atrovent

## 2023-02-20 NOTE — Progress Notes (Signed)
Pt came in on EMS CPAP, pt has BIPAP at home and has not been able to go without it. Placed patient on NIV, pt tol well. Will cont to monitor.  [name]

## 2023-02-20 NOTE — Assessment & Plan Note (Deleted)
Cr 0.9, back at baseline. -Avoid nephrotoxic agents

## 2023-02-20 NOTE — ED Provider Notes (Signed)
[facility] Provider Note   CSN: [id number] Arrival date & time: 02/20/23  0016     History  Chief Complaint  Patient presents with   Respiratory Distress   Level 5 caveat due to acuity of condition [name] is a 47 y.o. female.  The history is provided by the EMS personnel and the patient.  Patient with extensive history including morbid obesity, chronic respiratory failure, CAD, diabetes, substance use disorder presents with shortness of breath and mild chest tightness.  Patient reports increasing shortness of breath throughout the day.  She has had some chest tightness.  Patient has a home BiPAP and has had to use it to improve her symptoms.  EMS reports patient was tachypneic on arrival and she was placed on CPAP and given nebulized treatments.    Past Medical History:  Diagnosis Date   Abscess of groin, left    Acute on chronic diastolic (congestive) heart failure ([place]) 02/05/2022   Acute on chronic respiratory failure with hypoxia ([name]) 04/11/2022   Acute osteomyelitis of ankle or foot, left ([name]) 01/31/2018   Acute respiratory failure with hypoxia and hypercarbia ([name]) 06/15/2020   Acute tubular necrosis ([name]) 07/12/2022   Alveolar hypoventilation    Amputation stump infection ([place]) 04/09/2018   Anemia    not on iron pill   Asthma    Bipolar 2 disorder ([name])    Candidiasis of vagina 05/15/2022   Carpal tunnel syndrome on right    recurrent   Cellulitis 08/2010-08/2011   Chest pain with low risk for cardiac etiology 05/11/2021   Chronic pain    COPD (chronic obstructive pulmonary disease) ([name])    Symbicort daily and Proventil as needed   Costochondritis    De Quervain's tenosynovitis, bilateral 11/01/2015   Depression    Diabetes mellitus type II, uncontrolled 2000   Type 2, Uncontrolled.Takes Lantus daily.Fasting blood sugar runs 150   Diabetic ulcer of right foot ([place]) 12/19/2021   Drug-seeking behavior     Elevated troponin    Exposure to mold 04/21/2020   GERD (gastroesophageal reflux disease)    takes Pantoprazole and Zantac daily   HLD (hyperlipidemia)    takes Atorvastatin daily   Hypertension    takes Lisinopril and Coreg daily   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  Mild diffuse proximal LAD stenosis with no evidence of obstructive disease (20 to 30% diffuse stenosis);  2.  Left dominant circumflex with widely patent obtuse marginal branches and left PDA branch, no significant stenoses present  3.  Nondominant RCA with mild diffuse nonobstructive stenosis    OSA on CPAP    Peripheral neuropathy    takes Gabapentin daily   Pneumonia    "walking" several yrs ago and as a baby (12/05/2018)   Pneumonia due to COVID-19 virus 04/14/2021   Primary osteoarthritis of first carpometacarpal joint of left hand 07/30/2016   Rectal fissure    Restless leg    Right carpal tunnel syndrome 09/01/2011   SVT (supraventricular tachycardia)    Syncope 02/25/2016   Thrombocytosis 04/11/2022   Urinary frequency    Urinary incontinence 10/23/2020   UTI (urinary tract infection) 04/15/2021   Varicose veins    Right medial  thigh and Left leg     Home Medications Prior to Admission medications   Medication Sig Start Date End Date Taking? Authorizing Provider  acetaminophen (TYLENOL) 325 MG tablet Take 1-2 tablets (325-650 mg total) by mouth every 4 (four) hours as needed for mild pain. 06/04/22   Setzer, Edman Circle, PA-C  amLODipine (NORVASC) 10 MG tablet Take 1 tablet (10 mg total) by mouth daily. 09/27/22   [name], MD  ammonium lactate (AMLACTIN DAILY) 12 % lotion Apply 1 Application topically as needed for dry skin. 01/20/23    [name], DPM  aspirin EC 81 MG tablet Take 1 tablet (81 mg total) by mouth daily. Swallow whole. 11/19/20   Sherren Mocha, MD  atropine 1 % ophthalmic solution Place 1 drop into the right eye daily.    [provider]  BIOTIN PO Take by mouth.    [provider]  Brinzolamide-Brimonidine [facility]) 1-0.2 % SUSP Place 1 drop into the left eye in the morning, at noon, and at bedtime.    [provider]  cetirizine (ZYRTEC) 10 MG tablet Take 1 tablet (10 mg total) by mouth daily as needed for allergies. 02/11/22   [name], MD  Continuous Blood Gluc Sensor (DEXCOM G6 SENSOR) MISC Inject 1 applicator into the skin as directed. Change sensor every 10 days. 11/05/21   [name], MD  Continuous Blood Gluc Transmit (DEXCOM G6 TRANSMITTER) MISC Inject 1 Device into the skin as directed. Reuse 8 times with sensor changes. 09/03/21   [name], MD  DULoxetine (CYMBALTA) 60 MG capsule Take 2 capsules (120 mg total) by mouth daily. 12/13/22   [name], MD  gabapentin (NEURONTIN) 600 MG tablet TAKE 2 TABLETS BY MOUTH 3 TIMES DAILY Patient taking differently: Take 1,200 mg by mouth 3 (three) times daily. 11/19/22   [name], MD  GNP ULTICARE PEN NEEDLES 32G X 4 MM MISC USE TO inject insulin 2 TIMES DAILY 10/02/21   [name], MD  insulin aspart (NOVOLOG FLEXPEN) 100 UNIT/ML FlexPen Inject 20 Units into the skin 3 (three) times daily with meals. Patient taking differently: Inject 30 Units into the skin 3 (three) times daily with meals. 05/25/22   Alcus Dad, MD  insulin degludec (TRESIBA FLEXTOUCH) 200 UNIT/ML FlexTouch Pen Inject 52 Units into the skin 2 (two) times daily. Patient taking differently: Inject 60 Units into the skin 2 (two) times daily. 06/04/22   Setzer, Edman Circle, PA-C  Insulin Disposable Pump (OMNIPOD 5 G6 POD, GEN 5,) MISC Inject into the skin daily. 07/30/22   [provider]   ipratropium-albuterol (DUONEB) 0.5-2.5 (3) MG/3ML SOLN Take 3 mLs by nebulization every 6 (six) hours as needed. 09/24/22   Spero Geralds, MD  Melatonin 10 MG TABS Take 1 tablet by mouth at bedtime.    [provider]  Multiple Vitamins-Minerals (ONE-A-DAY WOMENS) tablet Take 1 tablet by mouth daily.    [provider]  nitroGLYCERIN (NITROSTAT) 0.4 MG SL tablet Place 1 tablet (0.4 mg total) under the tongue every 5 (five) minutes as needed for chest pain. 11/04/22   [name], MD  nystatin (MYCOSTATIN/NYSTOP) powder Apply 1 application. topically 3 (three) times daily. 03/30/22   [name], MD  ondansetron (ZOFRAN) 4 MG tablet Take 1 tablet (4 mg total) by mouth every 8 (eight) hours as needed for nausea or vomiting. 02/17/22   Lovorn, Jinny Blossom, MD  pantoprazole (PROTONIX) 40 MG tablet Take  1 tablet (40 mg total) by mouth daily. 09/27/22   [name], MD  polyethylene glycol powder [facility]) 17 GM/SCOOP powder Take 17 g by mouth daily as needed (constipation). 09/27/22   [name], MD  PROAIR HFA 108 [id number] Base) MCG/ACT inhaler INHALE 1 TO 2 PUFFS BY MOUTH EVERY 6 HOURS AS NEEDED FOR WHEEZING OR SHORTNESS OF BREATH Patient taking differently: Inhale 2 puffs into the lungs every 6 (six) hours as needed for wheezing or shortness of breath. 10/05/22   [name], MD  rOPINIRole (REQUIP) 1 MG tablet Take 1 tablet (1 mg total) by mouth at bedtime. 12/16/22   [name], MD  rosuvastatin (CRESTOR) 40 MG tablet Take 1 tablet (40 mg total) by mouth daily. 11/04/22   [name], MD  SYMBICORT 160-4.5 MCG/ACT inhaler INHALE 2 PUFFS into THE lungs 2 TIMES DAILY Patient taking differently: Inhale 2 puffs into the lungs 2 (two) times daily. 09/24/22   [name], MD  torsemide (DEMADEX) 20 MG tablet Take 4 tablets (80 mg total) by mouth 2 (two) times daily. Patient taking differently: Take 20 mg by mouth 2 (two) times  daily. 09/02/22   Sherren Mocha, MD  traZODone (DESYREL) 100 MG tablet Take 1 tablet (100 mg total) by mouth at bedtime as needed. for sleep 12/13/22   [name], MD  Vitamin D, Cholecalciferol, 25 MCG (1000 UT) CAPS Take 1,000 Units by mouth daily. 10/05/22   [name], MD      Allergies    Cefepime, Iodine, Kiwi extract, Morphine and related, Nalbuphine, Pentoxifylline, Cephalosporins, Toradol [ketorolac tromethamine], Morphine, and Nubain [nalbuphine hcl]    Review of Systems   Review of Systems  Unable to perform ROS: Acuity of condition    Physical Exam Updated Vital Signs BP (!) 177/86   Pulse (!) 102   Temp 98.8 F (37.1 C) (Oral)   Resp 20   Ht 1.575 m (5\' 2" )   Wt (!) 156.5 kg   SpO2 100%   BMI 63.10 kg/m  Physical Exam CONSTITUTIONAL: Ill-appearing HEAD: Normocephalic/atraumatic EYES: EOMI/PERRL ENMT: Mucous membranes moist NECK: supple no meningeal signs CV: S1/S2 noted, tachycardic LUNGS: Tachypneic and wheezing bilaterally, patient is currently on BiPAP ABDOMEN: soft, nontender, obese NEURO: Pt is awake/alert/appropriate, moves all extremitiesx4.  No facial droop.   EXTREMITIES: Left BKA noted, edema noted SKIN: warm, color normal  ED Results / Procedures / Treatments   Labs (all labs ordered are listed, but only abnormal results are displayed) Labs Reviewed  CBC WITH DIFFERENTIAL/PLATELET - Abnormal; Notable for the following components:      Result Value   MCHC 28.9 (*)    RDW 15.6 (*)    All other components within normal limits  BASIC METABOLIC PANEL - Abnormal; Notable for the following components:   Glucose, Bld 382 (*)    BUN 36 (*)    Creatinine, Ser 1.15 (*)    GFR, Estimated 60 (*)    All other components within normal limits  URINALYSIS, ROUTINE W REFLEX MICROSCOPIC  BRAIN NATRIURETIC PEPTIDE    EKG EKG Interpretation  Date/Time:  Sunday February 20 2023 00:22:39 EDT Ventricular Rate:  101 PR Interval:  156 QRS  Duration: 99 QT Interval:  356 QTC Calculation: 462 R Axis:   125 Text Interpretation: Sinus tachycardia Right axis deviation Low voltage, extremity and precordial leads Baseline wander in lead(s) V3 Confirmed by Ripley Fraise [phone]) on 02/20/2023 12:25:58 AM Prehospital EKG rhythm strip  reviewed.  Sinus rhythm with PVCs, no acute ST changes  Radiology DG Chest Port 1 View  Result Date: 02/20/2023 CLINICAL DATA:  Shortness of breath. EXAM: PORTABLE CHEST 1 VIEW COMPARISON:  December 08, 2022 FINDINGS: The heart size and mediastinal contours are within normal limits. Mild, stable prominence of the perihilar pulmonary vasculature is seen. Low lung volumes are noted. Both lungs are clear. The visualized skeletal structures are unremarkable. IMPRESSION: Low lung volumes with findings that may represent mild pulmonary vascular congestion. Electronically Signed   By: Virgina Norfolk M.D.   On: 02/20/2023 00:57    Procedures .Critical Care  Performed by: Ripley Fraise, MD Authorized by: Ripley Fraise, MD   Critical care provider statement:    Critical care time (minutes):  42   Critical care start time:  02/20/2023 12:30 AM   Critical care end time:  02/20/2023 1:12 AM   Critical care was necessary to treat or prevent imminent or life-threatening deterioration of the following conditions:  Cardiac failure and respiratory failure   Critical care was time spent personally by me on the following activities:  Examination of patient, development of treatment plan with patient or surrogate, evaluation of patient's response to treatment, pulse oximetry, ordering and review of radiographic studies, ordering and review of laboratory studies, ordering and performing treatments and interventions, re-evaluation of patient's condition and review of old charts   I assumed direction of critical care for this patient from another provider in my specialty: no     Care discussed with: admitting provider        Medications Ordered in ED Medications  nitroGLYCERIN (NITROGLYN) 2 % ointment 1 inch (has no administration in time range)  furosemide (LASIX) injection 80 mg (has no administration in time range)  albuterol (PROVENTIL) (2.5 MG/3ML) 0.083% nebulizer solution 5 mg (has no administration in time range)    ED Course/ Medical Decision Making/ A&P Clinical Course as of 02/20/23 0122  Sun Feb 20, 2023  0110 Glucose(!): 382 Hyperglycemia without anion gap [DW]  0110 Overall patient appears to be improving on noninvasive ventilation.  Patient with history of chronic respiratory failure and asthma but may also have some component of pulmonary edema.  Will give nitroglycerin and Lasix.  Will consult family medicine for admission [DW]  0121 Patient clinically improving.  Will be admitted to the family medical service.  Discussed with Dr. Rock Nephew with family medicine for admission [DW]  0122 Patient awake and alert, no ischemic changes on EKG.  Awaiting admission [DW]    Clinical Course User Index [DW] Ripley Fraise, MD                             Medical Decision Making Amount and/or Complexity of Data Reviewed Labs: ordered. Decision-making details documented in ED Course. Radiology: ordered.  Risk Prescription drug management. Decision regarding hospitalization.   This patient presents to the ED for concern of shortness of breath, this involves an extensive number of treatment options, and is a complaint that carries with it a high risk of complications and morbidity.  The differential diagnosis includes but is not limited to Acute coronary syndrome, pneumonia, acute pulmonary edema, pneumothorax, acute anemia, pulmonary embolism   Comorbidities that complicate the patient evaluation: Patient's presentation is complicated by their history of obesity, chronic respiratory failure  Social Determinants of Health: Patient's  limited mobility and substance use disorder   increases the  complexity of managing their presentation  Additional history obtained: Additional history obtained from EMS discussed with paramedic at the bedside Records reviewed Primary Care Documents  Lab Tests: I Ordered, and personally interpreted labs.  The pertinent results include: Hyperglycemia  Imaging Studies ordered: I ordered imaging studies including X-ray chest   I independently visualized and interpreted imaging which showed vascular congestion I agree with the radiologist interpretation  Cardiac Monitoring: The patient was maintained on a cardiac monitor.  I personally viewed and interpreted the cardiac monitor which showed an underlying rhythm of:  sinus rhythm  Medicines ordered and prescription drug management: I ordered medication including nitroglycerin and Lasix for presumed CHF  Critical Interventions:  non- invasive ventilation  Consultations Obtained: I requested consultation with the admitting physician family medicine , and discussed  findings as well as pertinent plan - they recommend: Admit  Reevaluation: After the interventions noted above, I reevaluated the patient and found that they have :improved  Complexity of problems addressed: Patient's presentation is most consistent with  acute presentation with potential threat to life or bodily function  Disposition: After consideration of the diagnostic results and the patient's response to treatment,  I feel that the patent would benefit from admission   .           Final Clinical Impression(s) / ED Diagnoses Final diagnoses:  Acute respiratory failure, unspecified whether with hypoxia or hypercapnia ([name])  Acute congestive heart failure, unspecified heart failure type [facility])    Rx / DC Orders ED Discharge Orders     None         Ripley Fraise, MD 02/20/23 0124

## 2023-02-20 NOTE — H&P (Signed)
Hospital Admission History and Physical Service Pager: [phone]  Patient name: [name] Medical record number: [id number] Date of Birth: Jun 10, 1976 Age: 47 y.o. Gender: female  Primary Care Provider: [name], MD Consultants: none Code Status: full Preferred Emergency Contact: Ridhi Almonte (daughter) [phone]  Chief Complaint: shortness of breath  Assessment and Plan: [name] is a 47 y.o. female presenting with shortness of breath. Differential for this patient's presentation of this includes CHF exacerbation, OHS, anxiety, CAP (no cough or leukocytosis), viral illness (no URI symptoms), COPD exacerbation, ACS, PE.    * Shortness of breath Acute on chronic. Etiology likely secondary to CHF exacerbation given gradual onset, evidence of mild pulmonary edema on CXR, and multiple prior admissions for the same. However likely components from OHS/OSA and possibly anxiety as well. Currently breathing comfortably on BiPAP and maintaining SpO2. -Admit to progressive, FMTS attending Dr. Thompson Grayer -Continue BiPAP; wean as appropriate -s/p Lasix IV 80mg  x1 in the ED -Continue Lasix 80mg  IV BID for now -Strict I/Os -Daily Weights -f/u BNP and troponin results -Consider ABG and/or CTA if worsening  Chronic kidney disease, stage 3a ([name]) Cr 1.15 (at baseline) on admission. -Daily BMP  Type 2 diabetes mellitus with hyperglycemia ([name]) Glucose 382 on admission. No anion gap. Home meds: tresiba 60u BID, aspart 30u TID with meals. -CBG monitoring with meals and bedtime -Semglee 45u BID -rSSI -Consider adding mealtime coverage in the morning   Chronic and stable conditions: HLD: continue Crestor 40mg  COPD: Dulera (substitute for Symbicort) GERD: continue Protonix 40mg  Diabetic neuropathy: continue Gabapentin 1200mg  TID and Duloxetine 120mg  daily HTN: continue home Amlodipine 10mg  OSA: continue BiPap nightly   FEN/GI: NPO while on BiPAP VTE Prophylaxis:  Lovenox  Disposition: progressive  History of Present Illness:  [name] is a 47 y.o. female presenting with shortness of breath.  Patient reports worsening shortness of breath over the past 3-4 weeks. Was trying to avoid coming to the hospital because it always results in her being admitted. She was trying to avoid this. However tonight felt she reached a limit and still had shortness of breath with her BiPAP on. Otherwise has been using her baseline 5L home oxygen.  Denies cough, congestion, fever, sick contacts. Does endorse feeling swollen in her abdomen but is unsure if her weight is up. Reports compliance with her home Torsemide 100mg  in the morning and 80mg  in the evening but thinks it isn't working like it used to. Feels her urine has been darker for the past 1 week.   Reports some chest discomfort this evening as well. She admits to using cocaine earlier this week due to stress at home. Her daughter apparently ran away from home a few times over the past month which has been stressful.  In the ED, patient was placed on BiPAP with some improvement in her respiratory status. CXR showed mild pulmonary edema. She was ordered 80mg  IV Lasix and admitted.  Review Of Systems: Per HPI  Pertinent Past Medical History: OHS OSA HFpEF COPD HTN HLD CAD T2DM Remainder reviewed in history tab.   Pertinent Past Surgical History: S/p L BKA and R TMA (2019, 2023)  Remainder reviewed in history tab.   Pertinent Social History: Tobacco use: Former Alcohol use: No Other Substance use: Cocaine (last use earlier this week) Lives with daughter, [name]  Pertinent Family History: Mother: diabetes, HLD Father: MI Remainder reviewed in history tab.   Important Outpatient Medications: Amlodipine 10mg  daily ASA 81mg  daily Duloxetine 120mg  daily  Gabapentin 1200mg  TID Insulin: tresiba 60u BID, aspart 30u TID w/meals Protonix 40mg  daily Requip 1mg  qhs Rosuvastatin 40mg  daily Torsemide  100mg  in the morning, 80mg  in the evening Trazodone 100mg  qhs prn Remainder reviewed in medication history.   Objective: BP (!) 140/52   Pulse (!) 103   Temp 98.8 F (37.1 C) (Oral)   Resp 19   Ht 5\' 2"  (1.575 m)   Wt (!) 156.5 kg   SpO2 100%   BMI 63.10 kg/m  Exam: General: alert, sitting up in bed, NAD, on BiPAP Eyes: EOMI ENTM: limited exam- patient on BiPAP Neck: supple, unable to evaluate JVD due to habitus Cardiovascular: RRR, normal S1/S2 Respiratory: speaking in complete sentences on BiPAP, normal respiratory rate, exam challenging due to habitus but no appreciable wheezes or crackles Gastrointestinal: abdomen obese, nontender MSK: s/p L BKA and R TMA, no appreciable lower extremity edema Neuro: A&O x3, no obvious focal deficits Psych: appropriate mood and affect  Labs:  CBC BMET  Recent Labs  Lab 02/20/23 0020  WBC 7.2  HGB 12.1  HCT 41.8  PLT 245   Recent Labs  Lab 02/20/23 0020  NA 136  K 4.8  CL 99  CO2 27  BUN 36*  CREATININE 1.15*  GLUCOSE 382*  CALCIUM 9.0    Pertinent additional labs:  BNP: Troponin:   EKG: sinus tachycardia at 101bpm, right axis deviation    Imaging Studies Performed:  DG Chest Port 1 View  Result Date: 02/20/2023 CLINICAL DATA:  Shortness of breath. EXAM: PORTABLE CHEST 1 VIEW COMPARISON:  December 08, 2022 FINDINGS: The heart size and mediastinal contours are within normal limits. Mild, stable prominence of the perihilar pulmonary vasculature is seen. Low lung volumes are noted. Both lungs are clear. The visualized skeletal structures are unremarkable. IMPRESSION: Low lung volumes with findings that may represent mild pulmonary vascular congestion. Electronically Signed   By: Virgina Norfolk M.D.   On: 02/20/2023 00:57      Alcus Dad, MD 02/20/2023, 2:49 AM PGY-3, [place] [facility] pager: [phone], text pages welcome Secure chat group [place]

## 2023-02-20 NOTE — ED Notes (Signed)
Respiratory present applying BIPAP

## 2023-02-20 NOTE — ED Notes (Signed)
Switched to [name]. Pt tolerating well.

## 2023-02-21 DIAGNOSIS — Z1152 Encounter for screening for COVID-19: Secondary | ICD-10-CM | POA: Diagnosis not present

## 2023-02-21 DIAGNOSIS — R443 Hallucinations, unspecified: Secondary | ICD-10-CM | POA: Diagnosis not present

## 2023-02-21 DIAGNOSIS — I509 Heart failure, unspecified: Secondary | ICD-10-CM | POA: Diagnosis not present

## 2023-02-21 DIAGNOSIS — J9622 Acute and chronic respiratory failure with hypercapnia: Secondary | ICD-10-CM | POA: Diagnosis present

## 2023-02-21 DIAGNOSIS — Z89512 Acquired absence of left leg below knee: Secondary | ICD-10-CM | POA: Diagnosis not present

## 2023-02-21 DIAGNOSIS — I5033 Acute on chronic diastolic (congestive) heart failure: Secondary | ICD-10-CM | POA: Diagnosis present

## 2023-02-21 DIAGNOSIS — F419 Anxiety disorder, unspecified: Secondary | ICD-10-CM | POA: Diagnosis present

## 2023-02-21 DIAGNOSIS — J9602 Acute respiratory failure with hypercapnia: Secondary | ICD-10-CM | POA: Diagnosis present

## 2023-02-21 DIAGNOSIS — E875 Hyperkalemia: Secondary | ICD-10-CM | POA: Diagnosis not present

## 2023-02-21 DIAGNOSIS — E1165 Type 2 diabetes mellitus with hyperglycemia: Secondary | ICD-10-CM | POA: Diagnosis present

## 2023-02-21 DIAGNOSIS — E1122 Type 2 diabetes mellitus with diabetic chronic kidney disease: Secondary | ICD-10-CM | POA: Diagnosis present

## 2023-02-21 DIAGNOSIS — G9341 Metabolic encephalopathy: Secondary | ICD-10-CM | POA: Diagnosis present

## 2023-02-21 DIAGNOSIS — I130 Hypertensive heart and chronic kidney disease with heart failure and stage 1 through stage 4 chronic kidney disease, or unspecified chronic kidney disease: Secondary | ICD-10-CM | POA: Diagnosis present

## 2023-02-21 DIAGNOSIS — I5031 Acute diastolic (congestive) heart failure: Secondary | ICD-10-CM | POA: Diagnosis not present

## 2023-02-21 DIAGNOSIS — R531 Weakness: Secondary | ICD-10-CM | POA: Diagnosis not present

## 2023-02-21 DIAGNOSIS — J9621 Acute and chronic respiratory failure with hypoxia: Secondary | ICD-10-CM | POA: Diagnosis present

## 2023-02-21 DIAGNOSIS — K219 Gastro-esophageal reflux disease without esophagitis: Secondary | ICD-10-CM | POA: Diagnosis present

## 2023-02-21 DIAGNOSIS — E8729 Other acidosis: Secondary | ICD-10-CM | POA: Diagnosis present

## 2023-02-21 DIAGNOSIS — J9601 Acute respiratory failure with hypoxia: Secondary | ICD-10-CM | POA: Diagnosis not present

## 2023-02-21 DIAGNOSIS — Z6841 Body Mass Index (BMI) 40.0 and over, adult: Secondary | ICD-10-CM | POA: Diagnosis not present

## 2023-02-21 DIAGNOSIS — R0602 Shortness of breath: Secondary | ICD-10-CM | POA: Diagnosis present

## 2023-02-21 DIAGNOSIS — J4489 Other specified chronic obstructive pulmonary disease: Secondary | ICD-10-CM | POA: Diagnosis present

## 2023-02-21 DIAGNOSIS — Z8616 Personal history of COVID-19: Secondary | ICD-10-CM | POA: Diagnosis not present

## 2023-02-21 DIAGNOSIS — J9600 Acute respiratory failure, unspecified whether with hypoxia or hypercapnia: Secondary | ICD-10-CM | POA: Diagnosis not present

## 2023-02-21 DIAGNOSIS — Z7401 Bed confinement status: Secondary | ICD-10-CM | POA: Diagnosis not present

## 2023-02-21 DIAGNOSIS — Z794 Long term (current) use of insulin: Secondary | ICD-10-CM | POA: Diagnosis not present

## 2023-02-21 DIAGNOSIS — E1142 Type 2 diabetes mellitus with diabetic polyneuropathy: Secondary | ICD-10-CM | POA: Diagnosis present

## 2023-02-21 DIAGNOSIS — Z87891 Personal history of nicotine dependence: Secondary | ICD-10-CM | POA: Diagnosis not present

## 2023-02-21 DIAGNOSIS — I2510 Atherosclerotic heart disease of native coronary artery without angina pectoris: Secondary | ICD-10-CM | POA: Diagnosis present

## 2023-02-21 DIAGNOSIS — N1831 Chronic kidney disease, stage 3a: Secondary | ICD-10-CM | POA: Diagnosis present

## 2023-02-21 DIAGNOSIS — E785 Hyperlipidemia, unspecified: Secondary | ICD-10-CM | POA: Diagnosis present

## 2023-02-21 DIAGNOSIS — E662 Morbid (severe) obesity with alveolar hypoventilation: Secondary | ICD-10-CM | POA: Diagnosis present

## 2023-02-21 DIAGNOSIS — N179 Acute kidney failure, unspecified: Secondary | ICD-10-CM | POA: Diagnosis present

## 2023-02-21 LAB — BASIC METABOLIC PANEL
Anion gap: 10 (ref 5–15)
Anion gap: 7 (ref 5–15)
Anion gap: 8 (ref 5–15)
BUN: 53 mg/dL — ABNORMAL HIGH (ref 6–20)
BUN: 60 mg/dL — ABNORMAL HIGH (ref 6–20)
BUN: 61 mg/dL — ABNORMAL HIGH (ref 6–20)
CO2: 25 mmol/L (ref 22–32)
CO2: 26 mmol/L (ref 22–32)
CO2: 27 mmol/L (ref 22–32)
Calcium: 8.8 mg/dL — ABNORMAL LOW (ref 8.9–10.3)
Calcium: 8.7 mg/dL — ABNORMAL LOW (ref 8.9–10.3)
Calcium: 8.8 mg/dL — ABNORMAL LOW (ref 8.9–10.3)
Chloride: 101 mmol/L (ref 98–111)
Chloride: 101 mmol/L (ref 98–111)
Chloride: 100 mmol/L (ref 98–111)
Creatinine, Ser: 2.01 mg/dL — ABNORMAL HIGH (ref 0.44–1.00)
Creatinine, Ser: 2.08 mg/dL — ABNORMAL HIGH (ref 0.44–1.00)
Creatinine, Ser: 2.08 mg/dL — ABNORMAL HIGH (ref 0.44–1.00)
GFR, Estimated: 31 mL/min — ABNORMAL LOW (ref >60)
GFR, Estimated: 29 mL/min — ABNORMAL LOW (ref >60)
GFR, Estimated: 29 mL/min — ABNORMAL LOW (ref >60)
Glucose, Bld: 240 mg/dL — ABNORMAL HIGH (ref 70–99)
Glucose, Bld: 236 mg/dL — ABNORMAL HIGH (ref 70–99)
Glucose, Bld: 203 mg/dL — ABNORMAL HIGH (ref 70–99)
Potassium: 5.1 mmol/L (ref 3.5–5.1)
Potassium: 6.3 mmol/L (ref 3.5–5.1)
Potassium: 6.1 mmol/L — ABNORMAL HIGH (ref 3.5–5.1)
Sodium: 136 mmol/L (ref 135–145)
Sodium: 134 mmol/L — ABNORMAL LOW (ref 135–145)
Sodium: 135 mmol/L (ref 135–145)

## 2023-02-21 LAB — BLOOD GAS, VENOUS
Acid-base deficit: 0.5 mmol/L (ref 0.0–2.0)
Acid-base deficit: 0.8 mmol/L (ref 0.0–2.0)
Bicarbonate: 30.2 mmol/L — ABNORMAL HIGH (ref 20.0–28.0)
Bicarbonate: 30.3 mmol/L — ABNORMAL HIGH (ref 20.0–28.0)
O2 Saturation: 95 %
O2 Saturation: 81.7 %
Patient temperature: 36.7
Patient temperature: 36.9
pCO2, Ven: 80 mmHg (ref 44–60)
pCO2, Ven: 85 mmHg (ref 44–60)
pH, Ven: 7.18 — CL (ref 7.25–7.43)
pH, Ven: 7.16 — CL (ref 7.25–7.43)
pO2, Ven: 68 mmHg — ABNORMAL HIGH (ref 32–45)
pO2, Ven: 49 mmHg — ABNORMAL HIGH (ref 32–45)

## 2023-02-21 LAB — GLUCOSE, CAPILLARY
Glucose-Capillary: 214 mg/dL — ABNORMAL HIGH (ref 70–99)
Glucose-Capillary: 194 mg/dL — ABNORMAL HIGH (ref 70–99)
Glucose-Capillary: 226 mg/dL — ABNORMAL HIGH (ref 70–99)
Glucose-Capillary: 202 mg/dL — ABNORMAL HIGH (ref 70–99)
Glucose-Capillary: 202 mg/dL — ABNORMAL HIGH (ref 70–99)
Glucose-Capillary: 198 mg/dL — ABNORMAL HIGH (ref 70–99)

## 2023-02-21 LAB — RAPID URINE DRUG SCREEN, HOSP PERFORMED
Amphetamines: NOT DETECTED
Barbiturates: NOT DETECTED
Benzodiazepines: NOT DETECTED
Cocaine: NOT DETECTED
Opiates: NOT DETECTED
Tetrahydrocannabinol: NOT DETECTED

## 2023-02-21 MED ORDER — ONDANSETRON HCL 4 MG/2ML IJ SOLN: 4.0000 mg | Freq: Once | INTRAMUSCULAR | Status: DC

## 2023-02-21 MED ORDER — DEXTROSE 5 % IV SOLN
500.0000 mg | Freq: Once | INTRAVENOUS | Status: AC
  Administered 2023-02-21: 500 mg via INTRAVENOUS
  Filled 2023-02-21: qty 500

## 2023-02-21 MED ORDER — GABAPENTIN 300 MG PO CAPS: 600.0000 mg | ORAL_CAPSULE | Freq: Three times a day (TID) | ORAL | Status: DC

## 2023-02-21 MED ORDER — LIVING WELL WITH DIABETES BOOK
Freq: Once | Status: AC
  Filled 2023-02-21: qty 1

## 2023-02-21 MED ORDER — INSULIN ASPART 100 UNIT/ML IV SOLN
10.0000 [IU] | Freq: Once | INTRAVENOUS | Status: AC
  Administered 2023-02-21: 10 [IU] via INTRAVENOUS

## 2023-02-21 MED ORDER — CHLORHEXIDINE GLUCONATE CLOTH 2 % EX PADS
6.0000 | MEDICATED_PAD | Freq: Every day | CUTANEOUS | Status: DC
  Administered 2023-02-21 – 2023-02-26 (×6): 6 via TOPICAL

## 2023-02-21 MED ORDER — ACETAMINOPHEN 500 MG PO TABS
1000.0000 mg | ORAL_TABLET | Freq: Four times a day (QID) | ORAL | Status: DC
  Administered 2023-02-21 – 2023-02-26 (×14): 1000 mg via ORAL
  Filled 2023-02-21 (×17): qty 2

## 2023-02-21 MED ORDER — ENOXAPARIN SODIUM 80 MG/0.8ML IJ SOSY
80.0000 mg | PREFILLED_SYRINGE | INTRAMUSCULAR | Status: DC
  Administered 2023-02-22 – 2023-02-26 (×5): 80 mg via SUBCUTANEOUS
  Filled 2023-02-21 (×5): qty 0.8

## 2023-02-21 MED ORDER — INSULIN ASPART 100 UNIT/ML IJ SOLN
5.0000 [IU] | Freq: Three times a day (TID) | INTRAMUSCULAR | Status: DC
  Administered 2023-02-21 (×2): 5 [IU] via SUBCUTANEOUS

## 2023-02-21 MED ORDER — FUROSEMIDE 10 MG/ML IJ SOLN
80.0000 mg | Freq: Once | INTRAMUSCULAR | Status: AC
  Administered 2023-02-22: 80 mg via INTRAVENOUS
  Filled 2023-02-21: qty 8

## 2023-02-21 MED ORDER — OXYCODONE HCL 5 MG PO TABS
5.0000 mg | ORAL_TABLET | Freq: Once | ORAL | Status: AC
  Administered 2023-02-21: 5 mg via ORAL
  Filled 2023-02-21: qty 1

## 2023-02-21 MED ORDER — SODIUM ZIRCONIUM CYCLOSILICATE 10 G PO PACK
10.0000 g | PACK | Freq: Once | ORAL | Status: AC
  Administered 2023-02-21: 10 g via ORAL
  Filled 2023-02-21: qty 1

## 2023-02-21 NOTE — Progress Notes (Signed)
Patient transported to ICU on non-invasive ventilation with a full face mask. Tolerated well.

## 2023-02-21 NOTE — Progress Notes (Addendum)
FMTS Interim Progress Note  S: Patient by RN for patient continuing to be sleepy throughout the day.  Per nursing yesterday patient was much more awake.  Has continued to be sleepy throughout the day.  Alert and oriented earlier this morning.  Patient denies being sleepy, however did fall asleep midway through conversation.  O: BP 98/73 (BP Location: Left Arm)   Pulse 87   Temp 99 F (37.2 C) (Oral)   Resp 16   Ht 5\' 2"  (1.575 m)   Wt (!) 157.4 kg   SpO2 97%   BMI 63.47 kg/m   General: NAD, sleeping comfortably in hospital bed Neuro: A&O to place, person, oriented to year but unaware of month, bilateral pupils equal round and reactive Respiratory: normal WOB on 3 L nasal cannula, intermittent wheezes, distant breath sounds, exam limited by body habitus Extremities: Moving all 4 extremities equally   A/P: AMS Does not appear to be acute mental status change from this a.m.  However, lethargy this AM thought to be due to one-time dose of oxycodone. Given patient's extensive respiratory and cardiovascular history (CHF, OSA, OHS) concern for hypercapnia. Patient also on large amounts of Gabapentin. Given worsening kidney function this also possible culprit. Prior glucose and BMP unremarkable for electrolyte derangement as cause. Reassuringly vitals are stable and respiratory support is at baseline. -VBG -BMP -Half dose of home Gabapentin to 600mg  TID, consider reducing further to renal dose -Home bipap while sleeping  Salvadore Oxford, MD 02/21/2023, 4:10 PM PGY-1, [place] Service pager [phone]  Addendum: VBG with pH 7.18 and pCO2 80. -Continue bipap -Repeat VBG in 2hrs -If CO2 not improving, or mental status worsening consult ICU for ventilation support

## 2023-02-21 NOTE — Progress Notes (Signed)
     Daily Progress Note Intern Pager: [phone]  Patient name: [name] Medical record number: [id number] Date of birth: 1976/10/25 Age: 47 y.o. Gender: female  Primary Care Provider: [name], MD Consultants: None Code Status: Full code  Pt Overview and Major Events to Date:  3/17: Admitted to FMTS  Assessment and Plan: [name] is a 47 y.o. female presenting with shortness of breath. PMHx includes HfpEF (recent ECHO have not been able to assess EF or diastolic function), OHS, OSA, COPD, HTN, T2DM.  * Shortness of breath Weaned to [place] but put back on BiPap overnight. 1.8L UOP since admission. BNP wnl, has been normal on prior admission with HF exacerbation as well. Troponins wnl. Some softer Bps, will monitor closely. -Continue home oxygen 5L [place] daytime and BiPap nightly -Hold Lasix given Cr increase, consider restarting home Torsemide if Cr stable in the morning -Strict I/Os -Daily Weights  Chronic kidney disease, stage 3a ([name]) Cr increased to 2.01 (baseline ~1.1, potentially 1.4-1.5 given prior diuresis). Will monitor closely given diuresis. -Daily BMP  Type 2 diabetes mellitus with hyperglycemia ([name]) BGL in 200-300s. Home meds: tresiba 60u BID, aspart 30u TID with meals. -CBG monitoring with meals and bedtime -Semglee 45u BID -rSSI -Add Novolog 5U TID with meals   FEN/GI: Heart healthy/carb modified PPx: Lovenox Dispo: Home pending clinical improvement  Subjective:  Patient assessed at bedside, BiPap in place. Patient not responsive to questions but shook her head when asked if any acute concerns.  Objective: Temp:  [97.5 F (36.4 C)-98.2 F (36.8 C)] 98.2 F (36.8 C) (03/18 0957) Pulse Rate:  [85-95] 91 (03/18 0641) Resp:  [13-22] 22 (03/18 0641) BP: (90-110)/(52-90) 103/78 (03/18 0957) SpO2:  [92 %-98 %] 95 % (03/18 0957) FiO2 (%):  [40 %-60 %] 40 % (03/18 0400) Weight:  [157.4 kg] 157.4 kg (03/18 0641) Physical Exam: General: Morbidly  obese female laying on her side, alert Cardiovascular: RRR, without murmur Respiratory: Difficult to assess due to body habitus, sating well per telemetry Abdomen: Soft, obese, non-tender Extremities: Minimal peripheral edema  Laboratory: Most recent CBC Lab Results  Component Value Date   WBC 7.2 02/20/2023   HGB 12.1 02/20/2023   HCT 41.8 02/20/2023   MCV 93.9 02/20/2023   PLT 245 02/20/2023   Most recent BMP    Latest Ref Rng & Units 02/21/2023   12:29 AM  BMP  Glucose 70 - 99 mg/dL 240   BUN 6 - 20 mg/dL 53   Creatinine 0.44 - 1.00 mg/dL 2.01   Sodium 135 - 145 mmol/L 136   Potassium 3.5 - 5.1 mmol/L 5.1   Chloride 98 - 111 mmol/L 101   CO2 22 - 32 mmol/L 25   Calcium 8.9 - 10.3 mg/dL 8.8     Other pertinent labs: Glu: [place], MD 02/21/2023, 10:36 AM  PGY-1, [place] Intern pager: [phone], text pages welcome Secure chat group [place]

## 2023-02-21 NOTE — Progress Notes (Signed)
Pt is very lethargic this am after being given oxycodone last night. AAOx4. VSS. Would not recommend further oxycodone.   Alvis Lemmings, RN 02/21/2023 10:34 AM

## 2023-02-21 NOTE — Progress Notes (Signed)
OT Cancellation Note  Patient Details Name: [name] MRN: [id number] DOB: 1975-12-10   Cancelled Treatment:    Reason Eval/Treat Not Completed: Fatigue/lethargy limiting ability to participate. Pt sitting eob on arrival and dropping drink due to fatigue. Pt leaning on bedside table falling asleep with tray sliding anteriorly away from patient. Pt encourage to return to supine. Pt immediately asleep in side lying and no longer responding to therapist. VSS 3L O2 [place] noted. Rn reports pt fatigued duration of the day and even waiting until the afternoon patient still not appropriate to fully participate.   Pt known to OT from 11/2022 ED visit. Pt lives in a motel with daughter and is dependent on a second family member next door that as a stove in their motel room. Pt only has access to a small refrigerator in her room. Pt at that time reported having an electric wheelchair with a popped tire from [employer] that could not be repaired until March 2024 due to coverage of the repair. Pt could greatly benefit from having the electric chair repaired for her mobility. Pt does not have prosthetic acutely which will limit mobility acutely. Pt at baseline uses a sliding board into manual w/c that daughter helps her mobilize with.   OT to check back on patient at next best time.   Jeri Modena 02/21/2023, 3:36 PM

## 2023-02-21 NOTE — TOC Initial Note (Addendum)
Transition of Care [facility]) - Initial/Assessment Note    Patient Details  Name: [name] MRN: [id number] Date of Birth: 1976/08/12  Transition of Care [facility]) CM/SW Contact:    [name], RN Phone Number: 02/21/2023, 1:53 PM  Clinical Narrative:                 NCM spoke with patient at the bedside, she lives with her daughter at home (hotel) [facility] room [address], she has transfer board, w/chair and prosthetic.  Her daughter is her caregiver at home, presents with sob, chf, has Left BKA.  PT eval rec outpatient PT for her , she states she has had this before on [facility] and would like to do this again at  [facility] on [address].  NCM sent referral thru epic for outpt PT. She also states she will need ambulance transport at dc.   Patient also has an aide with [facility] for 40 hrs.    Expected Discharge Plan: OP Rehab Barriers to Discharge: Continued Medical Work up   Patient Goals and CMS Choice Patient states their goals for this hospitalization and ongoing recovery are:: return home with daughter          Expected Discharge Plan and Services In-house Referral: NA Discharge Planning Services: CM Consult Post Acute Care Choice: NA                   DME Arranged: N/A DME Agency: NA       HH Arranged: NA          Prior Living Arrangements/Services   Lives with:: Adult Children Patient language and need for interpreter reviewed:: Yes Do you feel safe going back to the place where you live?: Yes      Need for Family Participation in Patient Care: Yes (Comment) Care giver support system in place?: Yes (comment) Current home services: DME (transfer board , wheelchair,) Criminal Activity/Legal Involvement Pertinent to Current Situation/Hospitalization: No - Comment as needed  Activities of Daily Living Home Assistive Devices/Equipment: BIPAP, Cane (specify quad or straight), Oxygen, Wheelchair ADL Screening (condition at time of  admission) Patient's cognitive ability adequate to safely complete daily activities?: Yes Is the patient deaf or have difficulty hearing?: No Does the patient have difficulty seeing, even when wearing glasses/contacts?: Yes Does the patient have difficulty concentrating, remembering, or making decisions?: No Patient able to express need for assistance with ADLs?: Yes Does the patient have difficulty dressing or bathing?: Yes Independently performs ADLs?: No Communication: Independent Dressing (OT): Independent Does the patient have difficulty walking or climbing stairs?: Yes Weakness of Legs: Right Weakness of Arms/Hands: Both  Permission Sought/Granted                  Emotional Assessment Appearance:: Appears stated age Attitude/Demeanor/Rapport: Engaged Affect (typically observed): Appropriate Orientation: : Oriented to Self, Oriented to Place, Oriented to  Time, Oriented to Situation Alcohol / Substance Use: Not Applicable Psych Involvement: No (comment)  Admission diagnosis:  Shortness of breath [R06.02] Acute respiratory failure, unspecified whether with hypoxia or hypercapnia ([name]) [J96.00] Acute congestive heart failure, unspecified heart failure type ([place]) [I50.9] Acute respiratory failure ([place]) [J96.00] Patient Active Problem List   Diagnosis Date Noted   Acute respiratory failure ([place]) 02/21/2023   Shortness of breath 02/20/2023   Acute exacerbation of CHF (congestive heart failure) ([place]) 11/28/2022   Cocaine use 11/05/2022   Cellulitis of right lower extremity 06/10/2022   Peripheral artery disease, right leg, mild([name])  05/15/2022   Osteomyelitis of foot, right, acute ([place]) 05/14/2022   Wound infection 04/09/2022   Adrenal nodule ([place]) 01/07/2022   Iritis    Chronic kidney disease, stage 3a ([place]) [id number]   Diastolic HF (heart failure) ([place]) 10/19/2021   Impaired mobility 07/07/2021   Chest pain 05/11/2021   Hyperlipidemia associated with type 2  diabetes mellitus ([place]) 01/15/2021   Hypertension associated with type 2 diabetes mellitus ([place]) 01/15/2021   Nonobstructive atherosclerosis of coronary artery 11/26/2020   Urinary incontinence 10/23/2020   Chronic respiratory failure with hypoxia ([place]) 06/14/2020   Constipation 04/03/2020   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[place]) 04/11/2019   Type 2 diabetes mellitus with hyperglycemia ([place]) 04/11/2019   S/P transmetatarsal amputation of foot, right ([place]) 01/25/2019   Allergic rhinitis 03/06/2018   Class 3 severe obesity due to excess calories with serious comorbidity and body mass index (BMI) of 50.0 to 59.9 in adult [facility])    Gout of knee, Right 10/22/2016   Vitamin D deficiency 09/05/2015   Recurrent candidiasis of vagina 09/05/2015   Varicose veins of leg with complications [id number]   Restless leg syndrome 10/17/2014   Chronic sinusitis 07/18/2014   Insomnia 08/14/2013   Encounter for chronic pain management 09/01/2011   Obesity hypoventilation syndrome ([place]) 05/22/2008   Mood disorder ([place]) 05/22/2008   Obstructive sleep apnea (adult) (pediatric) 05/22/2008   Hypertension 05/22/2008   Asthma, chronic 05/22/2008   GERD 05/22/2008   Type 2 diabetes mellitus with diabetic neuropathy, unspecified ([place]) 05/22/2008   PCP:  [name], MD Pharmacy:   [place], [place] - [address] [place] [address] Phone: [phone] Fax: [facility] [address] [place] [address] Phone: [phone] Fax: [phone]     Social Determinants of Health (SDOH) Social History: [place]: No Food Insecurity (02/20/2023)  Housing: Low Risk  (02/20/2023)  Transportation Needs: No Transportation Needs (02/20/2023)  Utilities: Not At Risk (02/20/2023)  Alcohol Screen: Low Risk  (10/10/2017)  Depression (PHQ2-9): Medium Risk (12/16/2022)  Financial Resource Strain: High Risk  (02/19/2021)  Stress: Stress Concern Present (02/19/2021)  Tobacco Use: Medium Risk (02/20/2023)   SDOH Interventions:     Readmission Risk Interventions    05/24/2022   11:33 AM  Readmission Risk Prevention Plan  Transportation Screening Complete  Medication Review ([place]) Complete  PCP or Specialist appointment within 3-5 days of discharge Complete  HRI or [place] Complete  SW Recovery Care/Counseling Consult Complete  Palliative Care Screening Not [place] Patient Refused

## 2023-02-21 NOTE — Progress Notes (Signed)
eLink Physician-Brief Progress Note Patient Name: [name] DOB: 06-Aug-1976 MRN: [id number]   Date of Service  02/21/2023  HPI/Events of Note  Acute on chronic hypoxemic and hypercapnic respiratory failure Acute metabolic encephalopathy secondary to above Acute decompensated HFpEF Hyperkalemia AKI on CKD stage 35A   47 year old morbidly obese female transferred from the floor to the ICU for management of her acute on chronic hypoxemic and hypercapnic respiratory failure now is failing BiPAP.  She is in acute decompensated heart failure and she had some increased work of breathing.  Given the degree of support needed, she was transferred to the ICU for further management.  She is currently on BiPAP with PEEP of 8, pressure control of 15 with minute ventilation of 14-18 L.  Last gas reviewed reflective of severe hypercapnia.  She is arousable.  eICU Interventions  We will continue to monitor, no indication for intubation at this time, but low threshold given her refractory respiratory failure.   0037 - Repeat ABG results: pH 7.240, PCO2 66, PO2 98, Bicarb 28.4.  Still able to follow commands and arousable.  No evidence of distress.  Still maintaining adequate minute ventilation.  Good seal.  Minimal leak.  Maintain BiPAP for now.  Will add on a VBG with morning labs.  62 - having anxiety while on Bipap- she knows she needs the mask but is very uncomfortable/anxious. Requesting something IV for anxiety --> Zyprexa once  Intervention Category Evaluation Type: New Patient Evaluation  [name] 02/21/2023, 10:17 PM

## 2023-02-21 NOTE — Progress Notes (Signed)
FMTS Brief Progress Note  S:Went bedside w/ Dr. Sabra Heck to see patient after sing-out that patient sleeping most of day, hard to keep awake, w/ hallucinations. Patient had VBG from 5 pm w/ pH 7.18, pCO2 80. At bedside patient was arrousable and able to answer questions, but slow to respond and almost seemed to be falling asleep during questions. While on floor, RN showed new VBG results that noted worsening pH 7.16 and pCO2 85.   O: BP (!) 115/93 (BP Location: Right Leg)   Pulse 99   Temp (!) 97.4 F (36.3 C) (Axillary)   Resp 20   Ht 5\' 2"  (1.575 m)   Wt (!) 157.4 kg   SpO2 97%   BMI 63.47 kg/m   General: Sleeping, NAD, A&O to person, place, but not year Resp: Coarse breath sounds, transmitted upper airway noises, good WOB Card: Distant heat sounds 2/2 body habitus  A/P: AMS Patient continues to be altered, but does not appear to be hallucinating, but is confused about the year and appears to be falling asleep during questioning. Respiratory status is worsening despite being on BiPAP. Day provider consulted CCM who will come assess the patient. Patient will need to stay on BiPAP and may need transfer to ICU if respiratory status continues to worsen. If patient becomes more confused, will not be good candidate for BiPAP. - Consult CCM, awaiting recs - Continue BiPAP - F/u 9pm BMP - F/u CBG check s/p insulin for hyperkalemia  - Orders reviewed. Labs for AM ordered, which was adjusted as needed.   Holley Bouche, MD 02/21/2023, 7:34 PM PGY-2, [place] Night Resident  Please page [phone] with questions.

## 2023-02-21 NOTE — Inpatient Diabetes Management (Signed)
Inpatient Diabetes Program Recommendations  AACE/ADA: New Consensus Statement on Inpatient Glycemic Control (2015)  Target Ranges:  Prepandial:   less than 140 mg/dL      Peak postprandial:   less than 180 mg/dL (1-2 hours)      Critically ill patients:  140 - 180 mg/dL   Lab Results  Component Value Date   GLUCAP 214 (H) 02/21/2023   HGBA1C 11.0 (H) 11/29/2022    Latest Reference Range & Units 02/20/23 07:46 02/20/23 11:26 02/20/23 15:52 02/20/23 20:50 02/21/23 06:48  Glucose-Capillary 70 - 99 mg/dL 317 (H) Novolog 15 units given 0946 318 (H) Novolog 15 units 274 (H) Novolog 11 units 274 (H) 214 (H) Novolog 7 units  (H): Data is abnormally high  Diabetes history: DM2 Outpatient Diabetes medications: Tresiba 60 units bid, Novolog 30 units tid Current orders for Inpatient glycemic control: Semglee 45 units bid, Novolog 0-20 units tid correction  Inpatient Diabetes Program Recommendations:   Patient received Novolog 41 units correction over the past 24 hrs. Please consider: -Increase Semglee to 50 units bid -Add Novolog 5 units tid meal coverage if eats 50% meals -Add Novolog 0-5 units hs correct ion  Patient sees Dr. Norma Fredrickson for endocrinology in [place]. Last office visit documented is 09/21/22.  Will plan to speak with patient during hospitalization.  Thank you, Nani Gasser. Karla Pavone, RN, MSN, CDE  Diabetes Coordinator Inpatient Glycemic Control Team Team Pager [phone] (8am-5pm) 02/21/2023 9:24 AM

## 2023-02-21 NOTE — Consult Note (Signed)
NAME:  [name], MRN:  [id number], DOB:  08/14/1976, LOS: 0 ADMISSION DATE:  02/20/2023, CONSULTATION DATE:  02/21/23 REFERRING MD:  pray, CHIEF COMPLAINT:  shortness of breath  History of Present Illness:  47 y/o F with PMH of COPD, OSA, HTN, DM2, HFpEF on home trilogy vent, and 5L [place] who presented to the ED with shortness of breath 3/17, she felt that she was taking her Lasix but it wasn't effective. She was placed on NIV in the ED and given nitro and lasix.  She diuresed 1.8L initially, but had rising creatinine so lasix was held.  She reported that she had not been taking her Torsemide at home and denied any infectious symptoms, fever or cough.   CXR consistent with pulmonary edema, no leukocytosis.   On 3/18 she became more somnolent and was started on Bipap with VBG showing a pH of 7.18, and pCo2 of 80.   She was started on Bipap and PCCM consulted in this setting  Pertinent  Medical History   has a past medical history of Abscess of groin, left, Acute on chronic diastolic (congestive) heart failure ([place]) (02/05/2022), Acute on chronic respiratory failure with hypoxia ([place]) (04/11/2022), Acute osteomyelitis of ankle or foot, left ([place]) (01/31/2018), Acute respiratory failure with hypoxia and hypercarbia ([place]) (06/15/2020), Acute tubular necrosis () (07/12/2022), Alveolar hypoventilation, Amputation stump infection ([place]) (04/09/2018), Anemia, Asthma, Bipolar 2 disorder ([place]), Candidiasis of vagina (05/15/2022), Carpal tunnel syndrome on right, Cellulitis (08/2010-08/2011), Chest pain with low risk for cardiac etiology (05/11/2021), Chronic pain, COPD (chronic obstructive pulmonary disease) ([place]), Costochondritis, De Quervain's tenosynovitis, bilateral (11/01/2015), Depression, Diabetes mellitus type II, uncontrolled (2000), Diabetic ulcer of right foot ([place]) (12/19/2021), Drug-seeking behavior, Elevated troponin, Exposure to mold (04/21/2020), GERD (gastroesophageal reflux disease), HLD  (hyperlipidemia), Hypertension, 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[place]) (04/11/2019), Left shoulder pain (06/23/2019), 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), Morbid obesity ([place]), Morbid obesity with BMI of 60.0-69.9, adult ([place]) (04/15/2021), Nocturia, Nonobstructive atherosclerosis of coronary artery (11/26/2020), OSA on CPAP, Peripheral neuropathy, Pneumonia, Pneumonia due to COVID-19 virus (04/14/2021), Primary osteoarthritis of first carpometacarpal joint of left hand (07/30/2016), Rectal fissure, Restless leg, Right carpal tunnel syndrome (09/01/2011), SVT (supraventricular tachycardia), Syncope (02/25/2016), Thrombocytosis (04/11/2022), Urinary frequency, Urinary incontinence (10/23/2020), UTI (urinary tract infection) (04/15/2021), and Varicose veins.   Significant Hospital Events: Including procedures, antibiotic start and stop dates in addition to other pertinent events   3/17 admit to family medicine with volume overload 3/18 bipap started, PCCM consult  Interim History / Subjective:  Pt sleepy, but still arousable and interactive, bipap settings adjusted   Objective   Blood pressure 111/84, pulse 87, temperature 98 F (36.7 C), temperature source Oral, resp. rate 16, height 5\' 2"  (1.575 m), weight (!) 157.4 kg, SpO2 97 %.    Vent Mode: PSV;BIPAP FiO2 (%):  [40 %] 40 %   Intake/Output Summary (Last 24 hours) at 02/21/2023 1914 Last data filed at 02/21/2023 1700 Gross per 24 hour  Intake 360 ml  Output 300 ml  Net 60 ml   Filed Weights   02/20/23 0020 02/20/23 0823 02/21/23 0641  Weight: (!) 156.5 kg (!) 157.5 kg (!) 157.4 kg    General:  morbidly obese F, tolerating bipap in no distress HEENT: MM pink/moist, on bipap  Neuro: arousable and interactive, somewhat confused to situation but following commands CV: s1s2 rrr, no m/r/g PULM: on bipap, decreased air entry bilaterally with mild expiratory wheezing, no rhonchi GI: soft, obese, non-tender Extremities: warm/dry, no edema  Skin: no  rashes or lesions  Resolved Hospital Problem list     Assessment & Plan:    Acute on chronic hypercarbic respiratory failure secondary secondary to Most likely secondary to HF exacerbation and volume overload and underlying COPD -move to ICU for close monitoring, at risk for intubation but currently not needed, Bipap settings increased and repeat VBG -suspect that her creatinine rise is secondary to cardiorenal syndrome and would continue to diurese, give Lasix 80mg  now and monitor response -continue prn nebs -last echo 1 yr ago with undetermined EF, no acute need for repeat right now  Acute on chronic stage 3a kidney disease -likely secondary to cardio-renal disease, continue lasix and follow BMP  Hyperkalemia -dose diuril ordered, follow BMP    Type 2 DM -SSI and Semglee    HTN HL -continue Norvasc and Teacher, adult education (right click and "Reselect all SmartList Selections" daily)   Diet/type: NPO DVT prophylaxis: LMWH GI prophylaxis: N/A Lines: N/A Foley:  Yes, and it is still needed Code Status:  full code Last date of multidisciplinary goals of care discussion [pending]  Labs   CBC: Recent Labs  Lab 02/20/23 0020  WBC 7.2  NEUTROABS 3.1  HGB 12.1  HCT 41.8  MCV 93.9  PLT 99991111    Basic Metabolic Panel: Recent Labs  Lab 02/20/23 0020 02/21/23 0029 02/21/23 1653  NA 136 136 134*  K 4.8 5.1 6.3*  CL 99 101 101  CO2 27 25 26   GLUCOSE 382* 240* 236*  BUN 36* 53* 60*  CREATININE 1.15* 2.01* 2.08*  CALCIUM 9.0 8.8* 8.7*   GFR: Estimated Creatinine Clearance: 50.1 mL/min (A) (by C-G formula based on SCr of 2.08 mg/dL (H)). Recent Labs  Lab 02/20/23 0020  WBC 7.2    Liver Function Tests: No results for input(s): "AST", "ALT", "ALKPHOS", "BILITOT", "PROT", "ALBUMIN" in the last 168 hours. No results for input(s): "LIPASE", "AMYLASE" in the last 168 hours. No results for input(s): "AMMONIA" in the last 168 hours.  ABG    Component  Value Date/Time   PHART 7.272 (L) 07/06/2022 0518   PCO2ART 66.6 (HH) 07/06/2022 0518   PO2ART 410 (H) 07/06/2022 0518   HCO3 30.2 (H) 02/21/2023 1653   TCO2 38 (H) 11/28/2022 1110   ACIDBASEDEF 0.5 02/21/2023 1653   O2SAT 95 02/21/2023 1653     Coagulation Profile: No results for input(s): "INR", "PROTIME" in the last 168 hours.  Cardiac Enzymes: No results for input(s): "CKTOTAL", "CKMB", "CKMBINDEX", "TROPONINI" in the last 168 hours.  HbA1C: HbA1c POC (<> result, manual entry)  Date/Time Value Ref Range Status  09/29/2021 10:46 AM >15.0 4.0 - 5.6 % Final  04/03/2020 10:25 AM >15.0 4.0 - 5.6 % Final   Hgb A1c MFr Bld  Date/Time Value Ref Range Status  11/29/2022 12:40 AM 11.0 (H) 4.8 - 5.6 % Final    Comment:    (NOTE)         Prediabetes: 5.7 - 6.4         Diabetes: >6.4         Glycemic control for adults with diabetes: <7.0   05/16/2022 01:43 AM 14.9 (H) 4.8 - 5.6 % Final    Comment:    (NOTE) Pre diabetes:          5.7%-6.4%  Diabetes:              >6.4%  Glycemic control for   <7.0% adults with diabetes     CBG: Recent Labs  Lab 02/20/23 2050 02/21/23 0648 02/21/23 1122 02/21/23 1602 02/21/23 1825  GLUCAP 274* 214* 194* 226* 202*    Review of Systems:   Unable to obtain secondary to bipap  Past Medical History:  She,  has a past medical history of Abscess of groin, left, Acute on chronic diastolic (congestive) heart failure ([place]) (02/05/2022), Acute on chronic respiratory failure with hypoxia ([place]) (04/11/2022), Acute osteomyelitis of ankle or foot, left ([place]) (01/31/2018), Acute respiratory failure with hypoxia and hypercarbia ([name]) (06/15/2020), Acute tubular necrosis ([name]) (07/12/2022), Alveolar hypoventilation, Amputation stump infection ([place]) (04/09/2018), Anemia, Asthma, Bipolar 2 disorder ([place]), Candidiasis of vagina (05/15/2022), Carpal tunnel syndrome on right, Cellulitis (08/2010-08/2011), Chest pain with low risk for cardiac etiology  (05/11/2021), Chronic pain, COPD (chronic obstructive pulmonary disease) ([place]), Costochondritis, De Quervain's tenosynovitis, bilateral (11/01/2015), Depression, Diabetes mellitus type II, uncontrolled (2000), Diabetic ulcer of right foot ([place]) (12/19/2021), Drug-seeking behavior, Elevated troponin, Exposure to mold (04/21/2020), GERD (gastroesophageal reflux disease), HLD (hyperlipidemia), Hypertension, 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[place]) (04/11/2019), Left shoulder pain (06/23/2019), 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), Morbid obesity ([place]), Morbid obesity with BMI of 60.0-69.9, adult ([place]) (04/15/2021), Nocturia, Nonobstructive atherosclerosis of coronary artery (11/26/2020), OSA on CPAP, Peripheral neuropathy, Pneumonia, Pneumonia due to COVID-19 virus (04/14/2021), Primary osteoarthritis of first carpometacarpal joint of left hand (07/30/2016), Rectal fissure, Restless leg, Right carpal tunnel syndrome (09/01/2011), SVT (supraventricular tachycardia), Syncope (02/25/2016), Thrombocytosis (04/11/2022), Urinary frequency, Urinary incontinence (10/23/2020), UTI (urinary tract infection) (04/15/2021), and Varicose veins.   Surgical History:   Past Surgical History:  Procedure Laterality Date   AMPUTATION Left 02/01/2018   Procedure: LEFT FOURTH AND 5TH TOE RAY AMPUTATION;  Surgeon: [name], MD;  Location: [facility];  Service: Orthopedics;  Laterality: Left;   AMPUTATION Left 03/03/2018   Procedure: LEFT BELOW KNEE AMPUTATION;  Surgeon: [name], MD;  Location: [facility];  Service: Orthopedics;  Laterality: Left;   AMPUTATION Right 05/21/2022   Procedure: REVISION AMPUTATION RAY 1ST;  Surgeon: [name], DPM;  Location: [facility];  Service: Podiatry;  Laterality: Right;   CARPAL TUNNEL RELEASE Bilateral    CESAREAN SECTION  2007   CORONARY ANGIOGRAPHY N/A 11/26/2020   Procedure: CORONARY ANGIOGRAPHY;  Surgeon: Sherren Mocha, MD;  Location: [facility];  Service: Cardiovascular;  Laterality: N/A;    FOOT AMPUTATION Right    INCISION AND DRAINAGE ABSCESS Left 10/16/2021   Procedure: INCISION AND DRAINAGE ABSCESS;  Surgeon: Dwan Bolt, MD;  Location: [facility];  Service: General;  Laterality: Left;   INCISION AND DRAINAGE PERIRECTAL ABSCESS Left 05/18/2019   Procedure: IRRIGATION AND DEBRIDEMENT OF PANNIS ABSCESS, POSSIBLE DEBRIDEMENT OF BUTTOCK WOUND;  Surgeon: Donnie Mesa, MD;  Location: [facility];  Service: General;  Laterality: Left;   IRRIGATION AND DEBRIDEMENT BUTTOCKS Left 05/17/2019   Procedure: IRRIGATION AND DEBRIDEMENT BUTTOCKS;  Surgeon: Donnie Mesa, MD;  Location: [facility];  Service: General;  Laterality: Left;   KNEE ARTHROSCOPY Right 07/17/2010   LEFT HEART CATHETERIZATION WITH CORONARY ANGIOGRAM N/A 07/27/2012   Procedure: LEFT HEART CATHETERIZATION WITH CORONARY ANGIOGRAM;  Surgeon: Sherren Mocha, MD;  [facility];  Service: Cardiovascular;  Laterality: N/A;   MASS EXCISION N/A 06/29/2013   Procedure:  WIDE LOCAL EXCISION OF POSTERIOR NECK ABSCESS;  Surgeon: Ralene Ok, MD;  Location: [facility];  Service: General;  Laterality: N/A;   REPAIR KNEE LIGAMENT Left    "fixed ligaments and chipped patella"   right transmetatarsal amputation      TRACHEOSTOMY TUBE PLACEMENT N/A 07/10/2022  Procedure: CONTROLLED EXTUBATION;  Surgeon: Izora Gala, MD;  Location: [facility];  Service: ENT;  Laterality: N/A;     Social History:   reports that she quit smoking about 17 years ago. Her smoking use included cigarettes. She has a 3.75 pack-year smoking history. She has never used smokeless tobacco. She reports that she does not currently use drugs. She reports that she does not drink alcohol.   Family History:  Her family history includes Allergic rhinitis in her mother; Anxiety disorder in her sister; Asthma in her child; Cancer in her maternal grandmother; Depression in her mother; Diabetes in her maternal grandmother and mother; GER disease in her mother; Heart attack in her  father, paternal grandfather, paternal grandmother, and paternal uncle; Heart disease in her paternal grandfather and paternal grandmother; Hyperlipidemia in her maternal grandmother and mother; Hypertension in her maternal grandmother; Migraines in her sister; Other in her maternal grandfather; Restless legs syndrome in her mother.   Allergies Allergies  Allergen Reactions   Cefepime Other (See Comments)    AKI, see records from [facility] hospitalization in January 2020.   Other reaction(s): Other (See Comments) "Shut down organs/kidneys"  Note pt has tolerated Rocephin and Keflex Other reaction(s): Not available   Iodine Other (See Comments)    Kidney dysfunction   Kiwi Extract Shortness Of Breath, Swelling and Anaphylaxis    Other reaction(s): Not available   Morphine And Related Nausea And Vomiting   Pentoxifylline Nausea And Vomiting    Trental   Cephalosporins Other (See Comments)    Pt states that they cause her kidneys to shut down   Toradol [Ketorolac Tromethamine] Other (See Comments)    Feels like something is crawling on me   Nubain [Nalbuphine Hcl] Other (See Comments)    "FEELS LIKE SOMETHING CRAWLING ON ME" Hallucinations      Home Medications  Prior to Admission medications   Medication Sig Start Date End Date Taking? Authorizing Provider  acetaminophen (TYLENOL) 325 MG tablet Take 1-2 tablets (325-650 mg total) by mouth every 4 (four) hours as needed for mild pain. 06/04/22  Yes Setzer, Edman Circle, PA-C  amLODipine (NORVASC) 10 MG tablet Take 1 tablet (10 mg total) by mouth daily. 09/27/22  Yes [name], MD  aspirin EC 81 MG tablet Take 1 tablet (81 mg total) by mouth daily. Swallow whole. 11/19/20  Yes Sherren Mocha, MD  Brinzolamide-Brimonidine [facility]) 1-0.2 % SUSP Place 1 drop into the left eye in the morning, at noon, and at bedtime.   Yes [provider]  cetirizine (ZYRTEC) 10 MG tablet Take 1 tablet (10 mg total) by mouth daily as needed  for allergies. 02/11/22  Yes [name], MD  DULoxetine (CYMBALTA) 60 MG capsule Take 2 capsules (120 mg total) by mouth daily. 12/13/22  Yes [name], MD  gabapentin (NEURONTIN) 600 MG tablet TAKE 2 TABLETS BY MOUTH 3 TIMES DAILY Patient taking differently: Take 1,200 mg by mouth 3 (three) times daily. 11/19/22  Yes [name], MD  insulin aspart (NOVOLOG FLEXPEN) 100 UNIT/ML FlexPen Inject 20 Units into the skin 3 (three) times daily with meals. Patient taking differently: Inject 30 Units into the skin 3 (three) times daily with meals. 05/25/22  Yes Alcus Dad, MD  insulin degludec (TRESIBA FLEXTOUCH) 200 UNIT/ML FlexTouch Pen Inject 52 Units into the skin 2 (two) times daily. Patient taking differently: Inject 60 Units into the skin 2 (two) times daily. 06/04/22  Yes Setzer, Edman Circle, PA-C  Insulin  Disposable Pump (OMNIPOD 5 G6 POD, GEN 5,) MISC Inject into the skin daily. 07/30/22  Yes [provider]  ipratropium-albuterol (DUONEB) 0.5-2.5 (3) MG/3ML SOLN Take 3 mLs by nebulization every 6 (six) hours as needed. Patient taking differently: Take 3 mLs by nebulization every 6 (six) hours as needed (for shortness of breath and wheezing). 09/24/22  Yes Spero Geralds, MD  latanoprost (XALATAN) 0.005 % ophthalmic solution Place 1 drop into both eyes daily. 02/04/23  Yes [provider]  Melatonin 10 MG TABS Take 1 tablet by mouth at bedtime.   Yes [provider]  Multiple Vitamins-Minerals (ONE-A-DAY WOMENS) tablet Take 1 tablet by mouth daily.   Yes [provider]  nitroGLYCERIN (NITROSTAT) 0.4 MG SL tablet Place 1 tablet (0.4 mg total) under the tongue every 5 (five) minutes as needed for chest pain. 11/04/22  Yes [name], MD  nystatin (MYCOSTATIN/NYSTOP) powder Apply 1 application. topically 3 (three) times daily. 03/30/22  Yes [name], MD  ondansetron (ZOFRAN) 4 MG tablet Take 1 tablet (4 mg total) by  mouth every 8 (eight) hours as needed for nausea or vomiting. 02/17/22  Yes Lovorn, Jinny Blossom, MD  pantoprazole (PROTONIX) 40 MG tablet Take 1 tablet (40 mg total) by mouth daily. 09/27/22  Yes [name], MD  PROAIR HFA 108 [phone] Base) MCG/ACT inhaler INHALE 1 TO 2 PUFFS BY MOUTH EVERY 6 HOURS AS NEEDED FOR WHEEZING OR SHORTNESS OF BREATH Patient taking differently: Inhale 2 puffs into the lungs every 6 (six) hours as needed for wheezing or shortness of breath. 10/05/22  Yes [name], MD  RHOPRESSA 0.02 % SOLN Place 1 drop into the left eye daily. 01/21/23  Yes [provider]  rOPINIRole (REQUIP) 1 MG tablet Take 1 tablet (1 mg total) by mouth at bedtime. 12/16/22  Yes [name], MD  rosuvastatin (CRESTOR) 40 MG tablet Take 1 tablet (40 mg total) by mouth daily. 11/04/22  Yes [name], MD  SYMBICORT 160-4.5 MCG/ACT inhaler INHALE 2 PUFFS into THE lungs 2 TIMES DAILY Patient taking differently: Inhale 2 puffs into the lungs 2 (two) times daily. 09/24/22  Yes [name], MD  torsemide (DEMADEX) 20 MG tablet Take 4 tablets (80 mg total) by mouth 2 (two) times daily. Patient taking differently: Take 80-100 mg by mouth See admin instructions. Take 5 tablets by mouth in the morning and 4 tablets in the evening 09/02/22  Yes Sherren Mocha, MD  traZODone (DESYREL) 100 MG tablet Take 1 tablet (100 mg total) by mouth at bedtime as needed. for sleep 12/13/22  Yes [name], MD  Vitamin D, Cholecalciferol, 25 MCG (1000 UT) CAPS Take 1,000 Units by mouth daily. 10/05/22  Yes [name], MD  ammonium lactate (AMLACTIN DAILY) 12 % lotion Apply 1 Application topically as needed for dry skin. Patient not taking: Reported on 02/20/2023 01/20/23   [name], DPM  Continuous Blood Gluc Sensor (DEXCOM G6 SENSOR) MISC Inject 1 applicator into the skin as directed. Change sensor every 10 days. 11/05/21   [name], MD  Continuous  Blood Gluc Transmit (DEXCOM G6 TRANSMITTER) MISC Inject 1 Device into the skin as directed. Reuse 8 times with sensor changes. 09/03/21   [name], MD  GNP ULTICARE PEN NEEDLES 32G X 4 MM MISC USE TO inject insulin 2 TIMES DAILY 10/02/21   [name], MD     Critical care time: 72  CRITICAL CARE Performed by: Otilio Carpen Crescencio Jozwiak   Total critical care time: 35 minutes  Critical care time was exclusive of separately billable procedures and treating other patients.  Critical care was necessary to treat or prevent imminent or life-threatening deterioration.  Critical care was time spent personally by me on the following activities: development of treatment plan with patient and/or surrogate as well as nursing, discussions with consultants, evaluation of patient's response to treatment, examination of patient, obtaining history from patient or surrogate, ordering and performing treatments and interventions, ordering and review of laboratory studies, ordering and review of radiographic studies, pulse oximetry and re-evaluation of patient's condition.   Otilio Carpen Athena Baltz, PA-C [facility] See Amion for pager If no response to pager , please call [phone] until 7pm After 7:00 pm call Elink  [phone]?[place]

## 2023-02-22 ENCOUNTER — Inpatient Hospital Stay (HOSPITAL_COMMUNITY): Payer: Medicaid Other

## 2023-02-22 DIAGNOSIS — J9601 Acute respiratory failure with hypoxia: Secondary | ICD-10-CM

## 2023-02-22 DIAGNOSIS — I5033 Acute on chronic diastolic (congestive) heart failure: Secondary | ICD-10-CM

## 2023-02-22 DIAGNOSIS — I5031 Acute diastolic (congestive) heart failure: Secondary | ICD-10-CM

## 2023-02-22 DIAGNOSIS — I509 Heart failure, unspecified: Secondary | ICD-10-CM

## 2023-02-22 DIAGNOSIS — J9602 Acute respiratory failure with hypercapnia: Secondary | ICD-10-CM

## 2023-02-22 LAB — MAGNESIUM: Magnesium: 2.4 mg/dL (ref 1.7–2.4)

## 2023-02-22 LAB — POCT I-STAT 7, (LYTES, BLD GAS, ICA,H+H)
Acid-base deficit: 1 mmol/L (ref 0.0–2.0)
Bicarbonate: 28.4 mmol/L — ABNORMAL HIGH (ref 20.0–28.0)
Calcium, Ion: 1.27 mmol/L (ref 1.15–1.40)
HCT: 38 % (ref 36.0–46.0)
Hemoglobin: 12.9 g/dL (ref 12.0–15.0)
O2 Saturation: 96 %
Patient temperature: 98
Potassium: 5.8 mmol/L — ABNORMAL HIGH (ref 3.5–5.1)
Sodium: 136 mmol/L (ref 135–145)
TCO2: 30 mmol/L (ref 22–32)
pCO2 arterial: 66 mmHg (ref 32–48)
pH, Arterial: 7.24 — ABNORMAL LOW (ref 7.35–7.45)
pO2, Arterial: 98 mmHg (ref 83–108)

## 2023-02-22 LAB — MRSA NEXT GEN BY PCR, NASAL: MRSA by PCR Next Gen: NOT DETECTED

## 2023-02-22 LAB — GLUCOSE, CAPILLARY
Glucose-Capillary: 155 mg/dL — ABNORMAL HIGH (ref 70–99)
Glucose-Capillary: 143 mg/dL — ABNORMAL HIGH (ref 70–99)
Glucose-Capillary: 95 mg/dL (ref 70–99)
Glucose-Capillary: 139 mg/dL — ABNORMAL HIGH (ref 70–99)
Glucose-Capillary: 163 mg/dL — ABNORMAL HIGH (ref 70–99)

## 2023-02-22 LAB — ECHOCARDIOGRAM COMPLETE
Area-P 1/2: 4.15 cm2
Height: 62 in
Weight: 6060 oz

## 2023-02-22 LAB — BASIC METABOLIC PANEL
Anion gap: 5 (ref 5–15)
BUN: 65 mg/dL — ABNORMAL HIGH (ref 6–20)
CO2: 30 mmol/L (ref 22–32)
Calcium: 8.8 mg/dL — ABNORMAL LOW (ref 8.9–10.3)
Chloride: 100 mmol/L (ref 98–111)
Creatinine, Ser: 1.75 mg/dL — ABNORMAL HIGH (ref 0.44–1.00)
GFR, Estimated: 36 mL/min — ABNORMAL LOW (ref >60)
Glucose, Bld: 177 mg/dL — ABNORMAL HIGH (ref 70–99)
Potassium: 5.1 mmol/L (ref 3.5–5.1)
Sodium: 135 mmol/L (ref 135–145)

## 2023-02-22 LAB — BLOOD GAS, VENOUS
Acid-Base Excess: 0 mmol/L (ref 0.0–2.0)
Bicarbonate: 28.9 mmol/L — ABNORMAL HIGH (ref 20.0–28.0)
O2 Saturation: 94.7 %
Patient temperature: 36.8
pCO2, Ven: 65 mmHg — ABNORMAL HIGH (ref 44–60)
pH, Ven: 7.25 (ref 7.25–7.43)
pO2, Ven: 75 mmHg — ABNORMAL HIGH (ref 32–45)

## 2023-02-22 LAB — BRAIN NATRIURETIC PEPTIDE: B Natriuretic Peptide: 29.2 pg/mL (ref 0.0–100.0)

## 2023-02-22 MED ORDER — OLANZAPINE 10 MG IM SOLR
5.0000 mg | Freq: Once | INTRAMUSCULAR | Status: AC
  Administered 2023-02-22: 5 mg via INTRAMUSCULAR
  Filled 2023-02-22: qty 10

## 2023-02-22 MED ORDER — STERILE WATER FOR INJECTION IJ SOLN
INTRAMUSCULAR | Status: AC
  Filled 2023-02-22: qty 10

## 2023-02-22 MED ORDER — PERFLUTREN LIPID MICROSPHERE
1.0000 mL | INTRAVENOUS | Status: AC | PRN
  Administered 2023-02-22: 4 mL via INTRAVENOUS

## 2023-02-22 MED ORDER — POLYETHYLENE GLYCOL 3350 17 G PO PACK: 17.0000 g | PACK | Freq: Two times a day (BID) | ORAL | Status: DC | PRN

## 2023-02-22 MED ORDER — IPRATROPIUM-ALBUTEROL 0.5-2.5 (3) MG/3ML IN SOLN
3.0000 mL | RESPIRATORY_TRACT | Status: DC
  Administered 2023-02-22 – 2023-02-23 (×6): 3 mL via RESPIRATORY_TRACT
  Filled 2023-02-22 (×6): qty 3

## 2023-02-22 MED ORDER — INSULIN GLARGINE-YFGN 100 UNIT/ML ~~LOC~~ SOLN
45.0000 [IU] | Freq: Two times a day (BID) | SUBCUTANEOUS | Status: DC
  Administered 2023-02-22: 45 [IU] via SUBCUTANEOUS
  Filled 2023-02-22 (×2): qty 0.45

## 2023-02-22 MED ORDER — INSULIN GLARGINE-YFGN 100 UNIT/ML ~~LOC~~ SOLN
30.0000 [IU] | Freq: Two times a day (BID) | SUBCUTANEOUS | Status: DC
  Administered 2023-02-22 – 2023-02-26 (×7): 30 [IU] via SUBCUTANEOUS
  Filled 2023-02-22 (×10): qty 0.3

## 2023-02-22 MED ORDER — DOCUSATE SODIUM 100 MG PO CAPS
100.0000 mg | ORAL_CAPSULE | Freq: Two times a day (BID) | ORAL | Status: DC
  Administered 2023-02-22: 100 mg via ORAL
  Filled 2023-02-22: qty 1

## 2023-02-22 MED ORDER — ARFORMOTEROL TARTRATE 15 MCG/2ML IN NEBU: 15.0000 ug | INHALATION_SOLUTION | Freq: Two times a day (BID) | RESPIRATORY_TRACT | Status: DC

## 2023-02-22 MED ORDER — INSULIN ASPART 100 UNIT/ML IJ SOLN
0.0000 [IU] | INTRAMUSCULAR | Status: DC
  Administered 2023-02-22 (×2): 2 [IU] via SUBCUTANEOUS
  Administered 2023-02-23 (×4): 3 [IU] via SUBCUTANEOUS
  Administered 2023-02-23 (×2): 2 [IU] via SUBCUTANEOUS
  Administered 2023-02-25: 5 [IU] via SUBCUTANEOUS
  Administered 2023-02-25 (×2): 2 [IU] via SUBCUTANEOUS
  Administered 2023-02-26: 3 [IU] via SUBCUTANEOUS
  Administered 2023-02-26: 2 [IU] via SUBCUTANEOUS

## 2023-02-22 MED ORDER — BUDESONIDE 0.5 MG/2ML IN SUSP
0.5000 mg | Freq: Two times a day (BID) | RESPIRATORY_TRACT | Status: DC
  Administered 2023-02-22 – 2023-02-23 (×3): 0.5 mg via RESPIRATORY_TRACT
  Filled 2023-02-22 (×3): qty 2

## 2023-02-22 MED ORDER — FUROSEMIDE 10 MG/ML IJ SOLN
60.0000 mg | Freq: Once | INTRAMUSCULAR | Status: AC
  Administered 2023-02-22: 60 mg via INTRAVENOUS
  Filled 2023-02-22: qty 6

## 2023-02-22 NOTE — Progress Notes (Signed)
OT Cancellation Note  Patient Details Name: Roena Postlewaite MRN: [id number] DOB: Feb 19, 1976   Cancelled Treatment:    Reason Eval/Treat Not Completed: Patient's level of consciousness;Patient not medically ready (Per RN, pt unarousable and currently on BiPap; medical team contimplating intubation. OT evaluation to follow up when pt is stable and able to participate.)  Elliot Cousin 02/22/2023, 12:30 PM

## 2023-02-22 NOTE — Progress Notes (Signed)
NAME:  [name], MRN:  [id number], DOB:  03/29/1976, LOS: 1 ADMISSION DATE:  02/20/2023, CONSULTATION DATE:  02/22/23 REFERRING MD:  pray, CHIEF COMPLAINT:  shortness of breath  History of Present Illness:  47 y/o F with PMH of COPD, OSA, HTN, DM2, HFpEF on home trilogy vent, and 5L [place] who presented to the ED with shortness of breath 3/17, she felt that she was taking her Lasix but it wasn't effective. She was placed on NIV in the ED and given nitro and lasix.  She diuresed 1.8L initially, but had rising creatinine so lasix was held.  She reported that she had not been taking her Torsemide at home and denied any infectious symptoms, fever or cough.   CXR consistent with pulmonary edema, no leukocytosis.   On 3/18 she became more somnolent and was started on Bipap with VBG showing a pH of 7.18, and pCo2 of 80.   She was started on Bipap and PCCM consulted in this setting  Pertinent  Medical History   has a past medical history of Abscess of groin, left, Acute on chronic diastolic (congestive) heart failure ([place]) (02/05/2022), Acute on chronic respiratory failure with hypoxia ([place]) (04/11/2022), Acute osteomyelitis of ankle or foot, left ([place]) (01/31/2018), Acute respiratory failure with hypoxia and hypercarbia ([place]) (06/15/2020), Acute tubular necrosis ([place]) (07/12/2022), Alveolar hypoventilation, Amputation stump infection ([place]) (04/09/2018), Anemia, Asthma, Bipolar 2 disorder ([place]), Candidiasis of vagina (05/15/2022), Carpal tunnel syndrome on right, Cellulitis (08/2010-08/2011), Chest pain with low risk for cardiac etiology (05/11/2021), Chronic pain, COPD (chronic obstructive pulmonary disease) ([place]), Costochondritis, De Quervain's tenosynovitis, bilateral (11/01/2015), Depression, Diabetes mellitus type II, uncontrolled (2000), Diabetic ulcer of right foot ([place]) (12/19/2021), Drug-seeking behavior, Elevated troponin, Exposure to mold (04/21/2020), GERD (gastroesophageal reflux disease), HLD  (hyperlipidemia), Hypertension, 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[place]) (04/11/2019), Left shoulder pain (06/23/2019), 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), Morbid obesity ([place]), Morbid obesity with BMI of 60.0-69.9, adult ([place]) (04/15/2021), Nocturia, Nonobstructive atherosclerosis of coronary artery (11/26/2020), OSA on CPAP, Peripheral neuropathy, Pneumonia, Pneumonia due to COVID-19 virus (04/14/2021), Primary osteoarthritis of first carpometacarpal joint of left hand (07/30/2016), Rectal fissure, Restless leg, Right carpal tunnel syndrome (09/01/2011), SVT (supraventricular tachycardia), Syncope (02/25/2016), Thrombocytosis (04/11/2022), Urinary frequency, Urinary incontinence (10/23/2020), UTI (urinary tract infection) (04/15/2021), and Varicose veins.   Significant Hospital Events: Including procedures, antibiotic start and stop dates in addition to other pertinent events   3/17 admit to family medicine with volume overload 3/18 bipap started, PCCM consult  Interim History / Subjective:  Patient remains lethargic Able to state name and place On bipap Received 80 lasix at midnight; 1.8 L UOP last 24 hours  Objective   Blood pressure 122/64, pulse 84, temperature 98.3 F (36.8 C), temperature source Axillary, resp. rate 16, height 5\' 2"  (1.575 m), weight (!) 171.8 kg, SpO2 98 %.    Vent Mode: PCV;BIPAP FiO2 (%):  [40 %] 40 % Set Rate:  [18 bmp] 18 bmp PEEP:  [8 cmH20] 8 cmH20   Intake/Output Summary (Last 24 hours) at 02/22/2023 0823 Last data filed at 02/22/2023 0800 Gross per 24 hour  Intake 439.28 ml  Output 1970 ml  Net -1530.72 ml    Filed Weights   02/20/23 0823 02/21/23 0641 02/22/23 0705  Weight: (!) 157.5 kg (!) 157.4 kg (!) 171.8 kg    General:   critically ill appearing on bipap HEENT: MM pink/moist; bipap in place Neuro: lethargic but arousable and able to state name and place; follows some commands CV: s1s2, RRR, no m/r/g PULM:  distant BS bilaterally; on bipap GI: distended;  soft, bsx4 active  Extremities: warm/dry, L BKA and R digital amputation Skin: no rashes or lesions    Resolved Hospital Problem list     Assessment & Plan:    Acute on chronic hypercarbic respiratory failure secondary secondary to Most likely secondary to HF exacerbation and volume overload and underlying COPD; on trilogy and 5L [place] at home Hx HFpEF Hx of COPD Hx of OSA Plan: -increased bipap settings this am -lasix this am -if no improvement in mental status will likely need to repeat abg in few hours -high risk for intubation -ordered scheduled duoneb w/ pulmicort and dc dulera -consider repeat echo; last done 02/06/2022 -trend cxr  Acute on chronic stage 3a kidney disease Hyperkalemia -likely secondary to cardio-renal disease Plan: -lasix this am -Trend BMP / urinary output -Replace electrolytes as indicated -Avoid nephrotoxic agents, ensure adequate renal perfusion  Type 2 DM Plan: -SSI and cbg monitoring -cont semglee  HTN HLD Plan: -continue Norvasc and Asa when more alert and able to take PO  L BKA and R digit amputation Plan: -hold gabapentin; cont Cymbalta when able to take PO    Best Practice (right click and "Reselect all SmartList Selections" daily)   Diet/type: NPO DVT prophylaxis: LMWH GI prophylaxis: N/A Lines: N/A Foley:  Yes, and it is still needed Code Status:  full code Last date of multidisciplinary goals of care discussion [3/19 attempted to reach daughter [name] over phone but no answer ]  Labs   CBC: Recent Labs  Lab 02/20/23 0020 02/22/23 0018  WBC 7.2  --   NEUTROABS 3.1  --   HGB 12.1 12.9  HCT 41.8 38.0  MCV 93.9  --   PLT 245  --      Basic Metabolic Panel: Recent Labs  Lab 02/20/23 0020 02/21/23 0029 02/21/23 1653 02/21/23 2029 02/22/23 0018 02/22/23 0518  NA 136 136 134* 135 136 135  K 4.8 5.1 6.3* 6.1* 5.8* 5.1  CL 99 101 101 100  --  100  CO2 27 25 26 27   --  30  GLUCOSE 382* 240* 236* 203*  --  177*   BUN 36* 53* 60* 61*  --  65*  CREATININE 1.15* 2.01* 2.08* 2.08*  --  1.75*  CALCIUM 9.0 8.8* 8.7* 8.8*  --  8.8*  MG  --   --   --   --   --  2.4    GFR: Estimated Creatinine Clearance: 63.3 mL/min (A) (by C-G formula based on SCr of 1.75 mg/dL (H)). Recent Labs  Lab 02/20/23 0020  WBC 7.2     Liver Function Tests: No results for input(s): "AST", "ALT", "ALKPHOS", "BILITOT", "PROT", "ALBUMIN" in the last 168 hours. No results for input(s): "LIPASE", "AMYLASE" in the last 168 hours. No results for input(s): "AMMONIA" in the last 168 hours.  ABG    Component Value Date/Time   PHART 7.240 (L) 02/22/2023 0018   PCO2ART 66.0 (HH) 02/22/2023 0018   PO2ART 98 02/22/2023 0018   HCO3 28.9 (H) 02/22/2023 0516   TCO2 30 02/22/2023 0018   ACIDBASEDEF 1.0 02/22/2023 0018   O2SAT 94.7 02/22/2023 0516     Coagulation Profile: No results for input(s): "INR", "PROTIME" in the last 168 hours.  Cardiac Enzymes: No results for input(s): "CKTOTAL", "CKMB", "CKMBINDEX", "TROPONINI" in the last 168 hours.  HbA1C: HbA1c POC (<> result, manual entry)  Date/Time Value Ref Range Status  09/29/2021 10:46 AM >15.0 4.0 -  5.6 % Final  04/03/2020 10:25 AM >15.0 4.0 - 5.6 % Final   Hgb A1c MFr Bld  Date/Time Value Ref Range Status  11/29/2022 12:40 AM 11.0 (H) 4.8 - 5.6 % Final    Comment:    (NOTE)         Prediabetes: 5.7 - 6.4         Diabetes: >6.4         Glycemic control for adults with diabetes: <7.0   05/16/2022 01:43 AM 14.9 (H) 4.8 - 5.6 % Final    Comment:    (NOTE) Pre diabetes:          5.7%-6.4%  Diabetes:              >6.4%  Glycemic control for   <7.0% adults with diabetes     CBG: Recent Labs  Lab 02/21/23 1602 02/21/23 1825 02/21/23 1928 02/21/23 2200 02/22/23 0746  GLUCAP 226* 202* 202* 198* 155*     Review of Systems:   Unable to obtain secondary to bipap  Past Medical History:  She,  has a past medical history of Abscess of groin, left, Acute on  chronic diastolic (congestive) heart failure ([place]) (02/05/2022), Acute on chronic respiratory failure with hypoxia ([place]) (04/11/2022), Acute osteomyelitis of ankle or foot, left ([place]) (01/31/2018), Acute respiratory failure with hypoxia and hypercarbia ([name]) (06/15/2020), Acute tubular necrosis ([name]) (07/12/2022), Alveolar hypoventilation, Amputation stump infection ([place]) (04/09/2018), Anemia, Asthma, Bipolar 2 disorder ([place]), Candidiasis of vagina (05/15/2022), Carpal tunnel syndrome on right, Cellulitis (08/2010-08/2011), Chest pain with low risk for cardiac etiology (05/11/2021), Chronic pain, COPD (chronic obstructive pulmonary disease) ([place]), Costochondritis, De Quervain's tenosynovitis, bilateral (11/01/2015), Depression, Diabetes mellitus type II, uncontrolled (2000), Diabetic ulcer of right foot ([place]) (12/19/2021), Drug-seeking behavior, Elevated troponin, Exposure to mold (04/21/2020), GERD (gastroesophageal reflux disease), HLD (hyperlipidemia), Hypertension, 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[place]) (04/11/2019), Left shoulder pain (06/23/2019), 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), Morbid obesity ([place]), Morbid obesity with BMI of 60.0-69.9, adult ([place]) (04/15/2021), Nocturia, Nonobstructive atherosclerosis of coronary artery (11/26/2020), OSA on CPAP, Peripheral neuropathy, Pneumonia, Pneumonia due to COVID-19 virus (04/14/2021), Primary osteoarthritis of first carpometacarpal joint of left hand (07/30/2016), Rectal fissure, Restless leg, Right carpal tunnel syndrome (09/01/2011), SVT (supraventricular tachycardia), Syncope (02/25/2016), Thrombocytosis (04/11/2022), Urinary frequency, Urinary incontinence (10/23/2020), UTI (urinary tract infection) (04/15/2021), and Varicose veins.   Surgical History:   Past Surgical History:  Procedure Laterality Date   AMPUTATION Left 02/01/2018   Procedure: LEFT FOURTH AND 5TH TOE RAY AMPUTATION;  Surgeon: [name], MD;  Location: [facility];  Service: Orthopedics;  Laterality:  Left;   AMPUTATION Left 03/03/2018   Procedure: LEFT BELOW KNEE AMPUTATION;  Surgeon: [name], MD;  Location: [facility];  Service: Orthopedics;  Laterality: Left;   AMPUTATION Right 05/21/2022   Procedure: REVISION AMPUTATION RAY 1ST;  Surgeon: [name], DPM;  [facility] ;  Service: Podiatry;  Laterality: Right;   CARPAL TUNNEL RELEASE Bilateral    CESAREAN SECTION  2007   CORONARY ANGIOGRAPHY N/A 11/26/2020   Procedure: CORONARY ANGIOGRAPHY;  Surgeon: Sherren Mocha, MD;  Location: [facility];  Service: Cardiovascular;  Laterality: N/A;   FOOT AMPUTATION Right    INCISION AND DRAINAGE ABSCESS Left 10/16/2021   Procedure: INCISION AND DRAINAGE ABSCESS;  Surgeon: Dwan Bolt, MD;  Location: [facility];  Service: General;  Laterality: Left;   INCISION AND DRAINAGE PERIRECTAL ABSCESS Left 05/18/2019   Procedure: IRRIGATION AND DEBRIDEMENT OF PANNIS ABSCESS, POSSIBLE DEBRIDEMENT OF BUTTOCK WOUND;  Surgeon: Donnie Mesa, MD;  Location: [facility];  Service: General;  Laterality: Left;   IRRIGATION AND DEBRIDEMENT BUTTOCKS Left 05/17/2019   Procedure: IRRIGATION AND DEBRIDEMENT BUTTOCKS;  Surgeon: Donnie Mesa, MD;  Location: [facility];  Service: General;  Laterality: Left;   KNEE ARTHROSCOPY Right 07/17/2010   LEFT HEART CATHETERIZATION WITH CORONARY ANGIOGRAM N/A 07/27/2012   Procedure: LEFT HEART CATHETERIZATION WITH CORONARY ANGIOGRAM;  Surgeon: Sherren Mocha, MD;  Location: [facility] C[facility];  Service: Cardiovascular;  Laterality: N/A;   MASS EXCISION N/A 06/29/2013   Procedure:  WIDE LOCAL EXCISION OF POSTERIOR NECK ABSCESS;  Surgeon: Ralene Ok, MD;  Location: [facility];  Service: General;  Laterality: N/A;   REPAIR KNEE LIGAMENT Left    "fixed ligaments and chipped patella"   right transmetatarsal amputation      TRACHEOSTOMY TUBE PLACEMENT N/A 07/10/2022   Procedure: CONTROLLED EXTUBATION;  Surgeon: Izora Gala, MD;  Location: [facility];  Service: ENT;  Laterality: N/A;      Social History:   reports that she quit smoking about 17 years ago. Her smoking use included cigarettes. She has a 3.75 pack-year smoking history. She has never used smokeless tobacco. She reports that she does not currently use drugs. She reports that she does not drink alcohol.   Family History:  Her family history includes Allergic rhinitis in her mother; Anxiety disorder in her sister; Asthma in her child; Cancer in her maternal grandmother; Depression in her mother; Diabetes in her maternal grandmother and mother; GER disease in her mother; Heart attack in her father, paternal grandfather, paternal grandmother, and paternal uncle; Heart disease in her paternal grandfather and paternal grandmother; Hyperlipidemia in her maternal grandmother and mother; Hypertension in her maternal grandmother; Migraines in her sister; Other in her maternal grandfather; Restless legs syndrome in her mother.   Allergies Allergies  Allergen Reactions   Cefepime Other (See Comments)    AKI, see records from [facility] hospitalization in January 2020.   Other reaction(s): Other (See Comments) "Shut down organs/kidneys"  Note pt has tolerated Rocephin and Keflex Other reaction(s): Not available   Iodine Other (See Comments)    Kidney dysfunction   Kiwi Extract Shortness Of Breath, Swelling and Anaphylaxis    Other reaction(s): Not available   Morphine And Related Nausea And Vomiting   Pentoxifylline Nausea And Vomiting    Trental   Cephalosporins Other (See Comments)    Pt states that they cause her kidneys to shut down   Toradol [Ketorolac Tromethamine] Other (See Comments)    Feels like something is crawling on me   Nubain [Nalbuphine Hcl] Other (See Comments)    "FEELS LIKE SOMETHING CRAWLING ON ME" Hallucinations      Home Medications  Prior to Admission medications   Medication Sig Start Date End Date Taking? Authorizing Provider  acetaminophen (TYLENOL) 325 MG tablet Take 1-2 tablets (325-650 mg  total) by mouth every 4 (four) hours as needed for mild pain. 06/04/22  Yes Setzer, Edman Circle, PA-C  amLODipine (NORVASC) 10 MG tablet Take 1 tablet (10 mg total) by mouth daily. 09/27/22  Yes [name], MD  aspirin EC 81 MG tablet Take 1 tablet (81 mg total) by mouth daily. Swallow whole. 11/19/20  Yes Sherren Mocha, MD  Brinzolamide-Brimonidine [facility]) 1-0.2 % SUSP Place 1 drop into the left eye in the morning, at noon, and at bedtime.   Yes [provider]  cetirizine (ZYRTEC) 10 MG tablet Take 1 tablet (10 mg total) by mouth daily  as needed for allergies. 02/11/22  Yes [name], MD  DULoxetine (CYMBALTA) 60 MG capsule Take 2 capsules (120 mg total) by mouth daily. 12/13/22  Yes [name], MD  gabapentin (NEURONTIN) 600 MG tablet TAKE 2 TABLETS BY MOUTH 3 TIMES DAILY Patient taking differently: Take 1,200 mg by mouth 3 (three) times daily. 11/19/22  Yes [name], MD  insulin aspart (NOVOLOG FLEXPEN) 100 UNIT/ML FlexPen Inject 20 Units into the skin 3 (three) times daily with meals. Patient taking differently: Inject 30 Units into the skin 3 (three) times daily with meals. 05/25/22  Yes Alcus Dad, MD  insulin degludec (TRESIBA FLEXTOUCH) 200 UNIT/ML FlexTouch Pen Inject 52 Units into the skin 2 (two) times daily. Patient taking differently: Inject 60 Units into the skin 2 (two) times daily. 06/04/22  Yes Setzer, Edman Circle, PA-C  Insulin Disposable Pump (OMNIPOD 5 G6 POD, GEN 5,) MISC Inject into the skin daily. 07/30/22  Yes [provider]  ipratropium-albuterol (DUONEB) 0.5-2.5 (3) MG/3ML SOLN Take 3 mLs by nebulization every 6 (six) hours as needed. Patient taking differently: Take 3 mLs by nebulization every 6 (six) hours as needed (for shortness of breath and wheezing). 09/24/22  Yes Spero Geralds, MD  latanoprost (XALATAN) 0.005 % ophthalmic solution Place 1 drop into both eyes daily. 02/04/23  Yes [provider]   Melatonin 10 MG TABS Take 1 tablet by mouth at bedtime.   Yes [provider]  Multiple Vitamins-Minerals (ONE-A-DAY WOMENS) tablet Take 1 tablet by mouth daily.   Yes [provider]  nitroGLYCERIN (NITROSTAT) 0.4 MG SL tablet Place 1 tablet (0.4 mg total) under the tongue every 5 (five) minutes as needed for chest pain. 11/04/22  Yes [name], MD  nystatin (MYCOSTATIN/NYSTOP) powder Apply 1 application. topically 3 (three) times daily. 03/30/22  Yes [name], MD  ondansetron (ZOFRAN) 4 MG tablet Take 1 tablet (4 mg total) by mouth every 8 (eight) hours as needed for nausea or vomiting. 02/17/22  Yes Lovorn, Jinny Blossom, MD  pantoprazole (PROTONIX) 40 MG tablet Take 1 tablet (40 mg total) by mouth daily. 09/27/22  Yes [name], MD  PROAIR HFA 108 [phone] Base) MCG/ACT inhaler INHALE 1 TO 2 PUFFS BY MOUTH EVERY 6 HOURS AS NEEDED FOR WHEEZING OR SHORTNESS OF BREATH Patient taking differently: Inhale 2 puffs into the lungs every 6 (six) hours as needed for wheezing or shortness of breath. 10/05/22  Yes [name], MD  RHOPRESSA 0.02 % SOLN Place 1 drop into the left eye daily. 01/21/23  Yes [provider]  rOPINIRole (REQUIP) 1 MG tablet Take 1 tablet (1 mg total) by mouth at bedtime. 12/16/22  Yes [name], MD  rosuvastatin (CRESTOR) 40 MG tablet Take 1 tablet (40 mg total) by mouth daily. 11/04/22  Yes [name], MD  SYMBICORT 160-4.5 MCG/ACT inhaler INHALE 2 PUFFS into THE lungs 2 TIMES DAILY Patient taking differently: Inhale 2 puffs into the lungs 2 (two) times daily. 09/24/22  Yes [name], MD  torsemide (DEMADEX) 20 MG tablet Take 4 tablets (80 mg total) by mouth 2 (two) times daily. Patient taking differently: Take 80-100 mg by mouth See admin instructions. Take 5 tablets by mouth in the morning and 4 tablets in the evening 09/02/22  Yes Sherren Mocha, MD  traZODone (DESYREL) 100 MG tablet Take 1  tablet (100 mg total) by mouth at bedtime as needed. for sleep 12/13/22  Yes [name], [place]  J, MD  Vitamin D, Cholecalciferol, 25 MCG (1000 UT) CAPS Take 1,000 Units by mouth daily. 10/05/22  Yes [name], MD  ammonium lactate (AMLACTIN DAILY) 12 % lotion Apply 1 Application topically as needed for dry skin. Patient not taking: Reported on 02/20/2023 01/20/23   [name], DPM  Continuous Blood Gluc Sensor (DEXCOM G6 SENSOR) MISC Inject 1 applicator into the skin as directed. Change sensor every 10 days. 11/05/21   [name], MD  Continuous Blood Gluc Transmit (DEXCOM G6 TRANSMITTER) MISC Inject 1 Device into the skin as directed. Reuse 8 times with sensor changes. 09/03/21   [name], MD  GNP ULTICARE PEN NEEDLES 32G X 4 MM MISC USE TO inject insulin 2 TIMES DAILY 10/02/21   [name], MD     Critical care time: 35 minutes     JD Rexene Agent [facility] 02/22/2023, 8:43 AM  Please see [email or web address] for pager details.  From 7A-7P if no response, please call [phone]. After hours, please call ELink [phone].

## 2023-02-23 DIAGNOSIS — I509 Heart failure, unspecified: Secondary | ICD-10-CM | POA: Diagnosis not present

## 2023-02-23 LAB — BASIC METABOLIC PANEL
Anion gap: 8 (ref 5–15)
BUN: 53 mg/dL — ABNORMAL HIGH (ref 6–20)
CO2: 31 mmol/L (ref 22–32)
Calcium: 9.1 mg/dL (ref 8.9–10.3)
Chloride: 101 mmol/L (ref 98–111)
Creatinine, Ser: 1.21 mg/dL — ABNORMAL HIGH (ref 0.44–1.00)
GFR, Estimated: 56 mL/min — ABNORMAL LOW (ref >60)
Glucose, Bld: 154 mg/dL — ABNORMAL HIGH (ref 70–99)
Potassium: 4.2 mmol/L (ref 3.5–5.1)
Sodium: 140 mmol/L (ref 135–145)

## 2023-02-23 LAB — CBC
HCT: 37 % (ref 36.0–46.0)
Hemoglobin: 11 g/dL — ABNORMAL LOW (ref 12.0–15.0)
MCH: 27 pg (ref 26.0–34.0)
MCHC: 29.7 g/dL — ABNORMAL LOW (ref 30.0–36.0)
MCV: 90.9 fL (ref 80.0–100.0)
Platelets: 239 10*3/uL (ref 150–400)
RBC: 4.07 MIL/uL (ref 3.87–5.11)
RDW: 15.8 % — ABNORMAL HIGH (ref 11.5–15.5)
WBC: 7.5 10*3/uL (ref 4.0–10.5)
nRBC: 0 % (ref 0.0–0.2)

## 2023-02-23 LAB — GLUCOSE, CAPILLARY
Glucose-Capillary: 142 mg/dL — ABNORMAL HIGH (ref 70–99)
Glucose-Capillary: 152 mg/dL — ABNORMAL HIGH (ref 70–99)
Glucose-Capillary: 187 mg/dL — ABNORMAL HIGH (ref 70–99)
Glucose-Capillary: 196 mg/dL — ABNORMAL HIGH (ref 70–99)
Glucose-Capillary: 173 mg/dL — ABNORMAL HIGH (ref 70–99)
Glucose-Capillary: 128 mg/dL — ABNORMAL HIGH (ref 70–99)

## 2023-02-23 MED ORDER — MOMETASONE FURO-FORMOTEROL FUM 100-5 MCG/ACT IN AERO
2.0000 | INHALATION_SPRAY | Freq: Two times a day (BID) | RESPIRATORY_TRACT | Status: DC
  Administered 2023-02-23 – 2023-02-25 (×5): 2 via RESPIRATORY_TRACT
  Filled 2023-02-23: qty 8.8

## 2023-02-23 MED ORDER — FUROSEMIDE 10 MG/ML IJ SOLN
20.0000 mg | Freq: Once | INTRAMUSCULAR | Status: AC
  Administered 2023-02-23: 20 mg via INTRAVENOUS

## 2023-02-23 MED ORDER — SIMETHICONE 40 MG/0.6ML PO SUSP
40.0000 mg | Freq: Once | ORAL | Status: AC
  Administered 2023-02-23: 40 mg

## 2023-02-23 MED ORDER — FUROSEMIDE 10 MG/ML IJ SOLN
40.0000 mg | Freq: Two times a day (BID) | INTRAMUSCULAR | Status: DC
  Administered 2023-02-23 – 2023-02-25 (×4): 40 mg via INTRAVENOUS
  Filled 2023-02-23 (×4): qty 4

## 2023-02-23 MED ORDER — PREGABALIN 100 MG PO CAPS
100.0000 mg | ORAL_CAPSULE | Freq: Three times a day (TID) | ORAL | Status: DC
  Administered 2023-02-23 – 2023-02-26 (×9): 100 mg via ORAL
  Filled 2023-02-23 (×2): qty 1
  Filled 2023-02-23 (×2): qty 2
  Filled 2023-02-23 (×5): qty 1

## 2023-02-23 MED ORDER — FUROSEMIDE 10 MG/ML IJ SOLN
20.0000 mg | Freq: Two times a day (BID) | INTRAMUSCULAR | Status: DC
  Administered 2023-02-23: 20 mg via INTRAVENOUS
  Filled 2023-02-23: qty 2

## 2023-02-23 MED ORDER — IPRATROPIUM-ALBUTEROL 0.5-2.5 (3) MG/3ML IN SOLN
3.0000 mL | RESPIRATORY_TRACT | Status: DC | PRN
  Administered 2023-02-23 (×2): 3 mL via RESPIRATORY_TRACT

## 2023-02-23 MED ORDER — LABETALOL HCL 5 MG/ML IV SOLN
10.0000 mg | INTRAVENOUS | Status: DC | PRN
  Administered 2023-02-23: 10 mg via INTRAVENOUS
  Filled 2023-02-23: qty 4

## 2023-02-23 MED ORDER — DOCUSATE SODIUM 100 MG PO CAPS: 100.0000 mg | ORAL_CAPSULE | Freq: Two times a day (BID) | ORAL | Status: DC | PRN

## 2023-02-23 MED ORDER — GABAPENTIN 300 MG PO CAPS
300.0000 mg | ORAL_CAPSULE | Freq: Two times a day (BID) | ORAL | Status: DC
  Administered 2023-02-23: 300 mg via ORAL
  Filled 2023-02-23: qty 1

## 2023-02-23 NOTE — Progress Notes (Signed)
Attempted to call patient's family to let them know we transferred patient. No answer on both numbers from the chart.

## 2023-02-23 NOTE — Progress Notes (Signed)
TRH night cross cover note:   I was notified by RN that the patient's evening blood sugar is currently 123 and that she is to for her nightly dose of 30 units of insulin glargine.   Per my chart review, it appears that her fasting morning blood sugar this morning was 152 after receiving 30 units of insulin glargine on the evening of 02/22/2023.  I subsequently requested at RN please give the patient's scheduled 30 units of insulin glargine this evening.   Update: upon additional review, it appears that [facility] is not involved in this patient's care until 7 AM on 02/24/23, at which time she is scheduled to be a PCCM pick-up. She is under the care of PCCM in the interval, during which [facility] is not involved. I requested that RN please contact PCCM regarding any questions leading up to this 7 AM pick-up/transition.     Babs Bertin, DO Hospitalist

## 2023-02-23 NOTE — Progress Notes (Signed)
eLink Physician-Brief Progress Note Patient Name: [name] DOB: [birth year] MRN: [id number]   Date of Service  02/23/2023  HPI/Events of Note  47 year old female with a history of lower extremity neuropathy on chronic gabapentin therapy.  She states that her lower extremity pain has been extremely bothersome and is requesting resumption of her gabapentin.  She has acute kidney injury which is resolving, encephalopathy which is resolving, at home it appears that she takes up to 1200 mg of gabapentin 3 times daily.  eICU Interventions  Would like to start with a dose reduction-300 mg twice daily of gabapentin and titrate up if tolerated without side effects.     Intervention Category Intermediate Interventions: Pain - evaluation and management  [name] 02/23/2023, 6:10 AM

## 2023-02-23 NOTE — Progress Notes (Signed)
OT Cancellation Note  Patient Details Name: [name] MRN: [id number] DOB: October 16, 1976   Cancelled Treatment:    Reason Eval/Treat Not Completed: Other (comment) Per RN, pt is moving at bed level independently. Upon arrival, pt was sitting unsupported EOB eating a meal. Pt's prosthetic is not present acutely and she adamantly declines transfer (squat pivot, lateral scoot, AP) attempt without her leg. Pt agreeable to participate OOB with OT once her mom brings her prosthetic. OT to follow up to initiate evaluation and determine any discharge needs.  Elliot Cousin 02/23/2023, 2:14 PM

## 2023-02-23 NOTE — TOC Progression Note (Addendum)
Transition of Care [facility]) - Progression Note    Patient Details  Name: [name] MRN: [id number] Date of Birth: 23-Aug-1976  Transition of Care [facility]) CM/SW Contact  Tom-Johnson, Renea Ee, RN Phone Number: 02/23/2023, 1:01 PM  Clinical Narrative:     CM notified by RN that patient is requesting a Home Portable Oxygen concentrator. CM spoke with patient at bedside and she states she receives Oxygen supplies and CPAP from [employer]. States an order was initially placed but has to come to the hospital before she was evaluated.  CM called and spoke with Erasmo Downer at [facility].  Patient will need to be set up with a RT at discharge for evaluation and if patient is qualified, they will provide.  CM will continue to follow as patient progresses with care towards discharge.     Expected Discharge Plan: OP Rehab Barriers to Discharge: Continued Medical Work up  Expected Discharge Plan and Services In-house Referral: NA Discharge Planning Services: CM Consult Post Acute Care Choice: NA                   DME Arranged: N/A DME Agency: NA       HH Arranged: NA           Social Determinants of Health (SDOH) Interventions SDOH Screenings   Food Insecurity: No Food Insecurity (02/20/2023)  Housing: [place]  (02/20/2023)  Transportation Needs: No Transportation Needs (02/20/2023)  Utilities: Not At Risk (02/20/2023)  Alcohol Screen: Low Risk  (10/10/2017)  Depression (PHQ2-9): Medium Risk (12/16/2022)  Financial Resource Strain: High Risk (02/19/2021)  Stress: Stress Concern Present (02/19/2021)  Tobacco Use: Medium Risk (02/20/2023)    Readmission Risk Interventions    05/24/2022   11:33 AM  Readmission Risk Prevention Plan  Transportation Screening Complete  Medication Review ([place]) Complete  PCP or Specialist appointment within 3-5 days of discharge Complete  HRI or [place] Complete  SW Recovery Care/Counseling Consult Complete  Palliative Care Screening Not  [place] Patient Refused

## 2023-02-23 NOTE — Progress Notes (Signed)
Patient has arrived on the unit, bed alarm on, educated on call bell and safety ensured.

## 2023-02-23 NOTE — Progress Notes (Signed)
NAME:  [name], MRN:  [id number], DOB:  1976-11-06, LOS: 2 ADMISSION DATE:  02/20/2023, CONSULTATION DATE:  02/23/23 REFERRING MD:  pray, CHIEF COMPLAINT:  shortness of breath  History of Present Illness:  47 y/o F with PMH of COPD, OSA, HTN, DM2, HFpEF on home trilogy vent, and 5L [place] who presented to the ED with shortness of breath 3/17, she felt that she was taking her Lasix but it wasn't effective. She was placed on NIV in the ED and given nitro and lasix.  She diuresed 1.8L initially, but had rising creatinine so lasix was held.  She reported that she had not been taking her Torsemide at home and denied any infectious symptoms, fever or cough.   CXR consistent with pulmonary edema, no leukocytosis.   On 3/18 she became more somnolent and was started on Bipap with VBG showing a pH of 7.18, and pCo2 of 80.   She was started on Bipap and PCCM consulted in this setting  Pertinent  Medical History   has a past medical history of Abscess of groin, left, Acute on chronic diastolic (congestive) heart failure ([place]) (02/05/2022), Acute on chronic respiratory failure with hypoxia ([place]) (04/11/2022), Acute osteomyelitis of ankle or foot, left ([place]) (01/31/2018), Acute respiratory failure with hypoxia and hypercarbia ([place]) (06/15/2020), Acute tubular necrosis ([place]) (07/12/2022), Alveolar hypoventilation, Amputation stump infection ([place]) (04/09/2018), Anemia, Asthma, Bipolar 2 disorder ([place]), Candidiasis of vagina (05/15/2022), Carpal tunnel syndrome on right, Cellulitis (08/2010-08/2011), Chest pain with low risk for cardiac etiology (05/11/2021), Chronic pain, COPD (chronic obstructive pulmonary disease) ([place]), Costochondritis, De Quervain's tenosynovitis, bilateral (11/01/2015), Depression, Diabetes mellitus type II, uncontrolled (2000), Diabetic ulcer of right foot ([place]) (12/19/2021), Drug-seeking behavior, Elevated troponin, Exposure to mold (04/21/2020), GERD (gastroesophageal reflux disease), HLD  (hyperlipidemia), Hypertension, 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[place]) (04/11/2019), Left shoulder pain (06/23/2019), 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), Morbid obesity ([place]), Morbid obesity with BMI of 60.0-69.9, adult ([place]) (04/15/2021), Nocturia, Nonobstructive atherosclerosis of coronary artery (11/26/2020), OSA on CPAP, Peripheral neuropathy, Pneumonia, Pneumonia due to COVID-19 virus (04/14/2021), Primary osteoarthritis of first carpometacarpal joint of left hand (07/30/2016), Rectal fissure, Restless leg, Right carpal tunnel syndrome (09/01/2011), SVT (supraventricular tachycardia), Syncope (02/25/2016), Thrombocytosis (04/11/2022), Urinary frequency, Urinary incontinence (10/23/2020), UTI (urinary tract infection) (04/15/2021), and Varicose veins.   Significant Hospital Events: Including procedures, antibiotic start and stop dates in addition to other pertinent events   3/17 admit to family medicine with volume overload 3/18 bipap started, PCCM consult 3/19 mental status improved 3/20 sending out to progressive  Interim History / Subjective:   AO and following commands Denies any SOB Wore bipap overnight  Objective   Blood pressure 138/80, pulse 92, temperature 98 F (36.7 C), temperature source Axillary, resp. rate (!) 21, height 5\' 2"  (1.575 m), weight (!) 167.2 kg, SpO2 97 %.    Vent Mode: BIPAP;PCV FiO2 (%):  [28 %-40 %] 40 % Set Rate:  [20 bmp] 20 bmp PEEP:  [8 cmH20] 8 cmH20   Intake/Output Summary (Last 24 hours) at 02/23/2023 0727 Last data filed at 02/23/2023 0407 Gross per 24 hour  Intake 120 ml  Output 4000 ml  Net -3880 ml    Filed Weights   02/21/23 0641 02/22/23 0705 02/23/23 0405  Weight: (!) 157.4 kg (!) 171.8 kg (!) 167.2 kg    General:   critically ill appearing on bipap HEENT: MM pink/moist; bipap in place Neuro: lethargic but arousable and able to state name and place; follows some commands CV: s1s2, RRR, no m/r/g PULM:  distant BS bilaterally; on bipap GI:  distended; soft, bsx4 active  Extremities: warm/dry, L BKA and R digital amputation Skin: no rashes or lesions    Resolved Hospital Problem list     Assessment & Plan:    Acute on chronic hypercarbic respiratory failure secondary secondary to Most likely secondary to HF exacerbation and volume overload and underlying COPD; on trilogy and 5L [place] at home Hx HFpEF Hx of COPD Hx of OSA Plan: -cont [place] for sats >92% -bipap qhs and prn for naps -lasix bid -change back to dulera and duoneb prn  Acute on chronic stage 3a kidney disease Hyperkalemia -likely secondary to cardio-renal disease Plan: -cont lasix -Trend BMP / urinary output -Replace electrolytes as indicated -Avoid nephrotoxic agents, ensure adequate renal perfusion  Type 2 DM Plan: -SSI and cbg monitoring -cont semglee  HTN HLD Plan: -continue Norvasc and Asa  -lasix bid  L BKA and R digit amputation Plan: -will dc gabapentin given sedating effects and change to lyrica; cont Cymbalta    Best Practice (right click and "Reselect all SmartList Selections" daily)   Diet/type: Regular consistency (see orders) DVT prophylaxis: LMWH GI prophylaxis: N/A Lines: N/A Foley:  Yes, and it is still needed Code Status:  full code Last date of multidisciplinary goals of care discussion [3/20 updated patient at bedside ]  Labs   CBC: Recent Labs  Lab 02/20/23 0020 02/22/23 0018 02/23/23 0211  WBC 7.2  --  7.5  NEUTROABS 3.1  --   --   HGB 12.1 12.9 11.0*  HCT 41.8 38.0 37.0  MCV 93.9  --  90.9  PLT 245  --  239     Basic Metabolic Panel: Recent Labs  Lab 02/21/23 0029 02/21/23 1653 02/21/23 2029 02/22/23 0018 02/22/23 0518 02/23/23 0211  NA 136 134* 135 136 135 140  K 5.1 6.3* 6.1* 5.8* 5.1 4.2  CL 101 101 100  --  100 101  CO2 25 26 27   --  30 31  GLUCOSE 240* 236* 203*  --  177* 154*  BUN 53* 60* 61*  --  65* 53*  CREATININE 2.01* 2.08* 2.08*  --  1.75* 1.21*  CALCIUM 8.8* 8.7* 8.8*  --   8.8* 9.1  MG  --   --   --   --  2.4  --     GFR: Estimated Creatinine Clearance: 89.8 mL/min (A) (by C-G formula based on SCr of 1.21 mg/dL (H)). Recent Labs  Lab 02/20/23 0020 02/23/23 0211  WBC 7.2 7.5     Liver Function Tests: No results for input(s): "AST", "ALT", "ALKPHOS", "BILITOT", "PROT", "ALBUMIN" in the last 168 hours. No results for input(s): "LIPASE", "AMYLASE" in the last 168 hours. No results for input(s): "AMMONIA" in the last 168 hours.  ABG    Component Value Date/Time   PHART 7.240 (L) 02/22/2023 0018   PCO2ART 66.0 (HH) 02/22/2023 0018   PO2ART 98 02/22/2023 0018   HCO3 28.9 (H) 02/22/2023 0516   TCO2 30 02/22/2023 0018   ACIDBASEDEF 1.0 02/22/2023 0018   O2SAT 94.7 02/22/2023 0516     Coagulation Profile: No results for input(s): "INR", "PROTIME" in the last 168 hours.  Cardiac Enzymes: No results for input(s): "CKTOTAL", "CKMB", "CKMBINDEX", "TROPONINI" in the last 168 hours.  HbA1C: HbA1c POC (<> result, manual entry)  Date/Time Value Ref Range Status  09/29/2021 10:46 AM >15.0 4.0 - 5.6 % Final  04/03/2020 10:25 AM >15.0 4.0 - 5.6 % Final   Hgb  A1c MFr Bld  Date/Time Value Ref Range Status  11/29/2022 12:40 AM 11.0 (H) 4.8 - 5.6 % Final    Comment:    (NOTE)         Prediabetes: 5.7 - 6.4         Diabetes: >6.4         Glycemic control for adults with diabetes: <7.0   05/16/2022 01:43 AM 14.9 (H) 4.8 - 5.6 % Final    Comment:    (NOTE) Pre diabetes:          5.7%-6.4%  Diabetes:              >6.4%  Glycemic control for   <7.0% adults with diabetes     CBG: Recent Labs  Lab 02/22/23 1139 02/22/23 1538 02/22/23 1935 02/22/23 2327 02/23/23 0340  GLUCAP 143* 95 139* 163* 142*     Review of Systems:   Unable to obtain secondary to bipap  Past Medical History:  She,  has a past medical history of Abscess of groin, left, Acute on chronic diastolic (congestive) heart failure ([place]) (02/05/2022), Acute on chronic  respiratory failure with hypoxia ([place]) (04/11/2022), Acute osteomyelitis of ankle or foot, left ([place]) (01/31/2018), Acute respiratory failure with hypoxia and hypercarbia ([name]) (06/15/2020), Acute tubular necrosis ([place]) (07/12/2022), Alveolar hypoventilation, Amputation stump infection ([place]) (04/09/2018), Anemia, Asthma, Bipolar 2 disorder ([place]), Candidiasis of vagina (05/15/2022), Carpal tunnel syndrome on right, Cellulitis (08/2010-08/2011), Chest pain with low risk for cardiac etiology (05/11/2021), Chronic pain, COPD (chronic obstructive pulmonary disease) ([place]), Costochondritis, De Quervain's tenosynovitis, bilateral (11/01/2015), Depression, Diabetes mellitus type II, uncontrolled (2000), Diabetic ulcer of right foot ([place]) (12/19/2021), Drug-seeking behavior, Elevated troponin, Exposure to mold (04/21/2020), GERD (gastroesophageal reflux disease), HLD (hyperlipidemia), Hypertension, 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[place]) (04/11/2019), Left shoulder pain (06/23/2019), 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), Morbid obesity ([place]), Morbid obesity with BMI of 60.0-69.9, adult ([place]) (04/15/2021), Nocturia, Nonobstructive atherosclerosis of coronary artery (11/26/2020), OSA on CPAP, Peripheral neuropathy, Pneumonia, Pneumonia due to COVID-19 virus (04/14/2021), Primary osteoarthritis of first carpometacarpal joint of left hand (07/30/2016), Rectal fissure, Restless leg, Right carpal tunnel syndrome (09/01/2011), SVT (supraventricular tachycardia), Syncope (02/25/2016), Thrombocytosis (04/11/2022), Urinary frequency, Urinary incontinence (10/23/2020), UTI (urinary tract infection) (04/15/2021), and Varicose veins.   Surgical History:   Past Surgical History:  Procedure Laterality Date   AMPUTATION Left 02/01/2018   Procedure: LEFT FOURTH AND 5TH TOE RAY AMPUTATION;  Surgeon: [name], MD;  Location: [facility];  Service: Orthopedics;  Laterality: Left;   AMPUTATION Left 03/03/2018   Procedure: LEFT BELOW KNEE AMPUTATION;   Surgeon: [name], MD;  Location: [facility];  Service: Orthopedics;  Laterality: Left;   AMPUTATION Right 05/21/2022   Procedure: REVISION AMPUTATION RAY 1ST;  Surgeon: [name], DPM;  Location: [place] [facility];  Service: Podiatry;  Laterality: Right;   CARPAL TUNNEL RELEASE Bilateral    CESAREAN SECTION  2007   CORONARY ANGIOGRAPHY N/A 11/26/2020   Procedure: CORONARY ANGIOGRAPHY;  Surgeon: Sherren Mocha, MD;  Location: [facility];  Service: Cardiovascular;  Laterality: N/A;   FOOT AMPUTATION Right    INCISION AND DRAINAGE ABSCESS Left 10/16/2021   Procedure: INCISION AND DRAINAGE ABSCESS;  Surgeon: Dwan Bolt, MD;  Location: [facility];  Service: General;  Laterality: Left;   INCISION AND DRAINAGE PERIRECTAL ABSCESS Left 05/18/2019   Procedure: IRRIGATION AND DEBRIDEMENT OF PANNIS ABSCESS, POSSIBLE DEBRIDEMENT OF BUTTOCK WOUND;  Surgeon: Donnie Mesa, MD;  Location: [facility];  Service: General;  Laterality: Left;  IRRIGATION AND DEBRIDEMENT BUTTOCKS Left 05/17/2019   Procedure: IRRIGATION AND DEBRIDEMENT BUTTOCKS;  Surgeon: Donnie Mesa, MD;  Location: [place];  Service: General;  Laterality: Left;   KNEE ARTHROSCOPY Right 07/17/2010   LEFT HEART CATHETERIZATION WITH CORONARY ANGIOGRAM N/A 07/27/2012   Procedure: LEFT HEART CATHETERIZATION WITH CORONARY ANGIOGRAM;  Surgeon: Sherren Mocha, MD;  Location: [facility];  Service: Cardiovascular;  Laterality: N/A;   MASS EXCISION N/A 06/29/2013   Procedure:  WIDE LOCAL EXCISION OF POSTERIOR NECK ABSCESS;  Surgeon: Ralene Ok, MD;  Location: [facility];  Service: General;  Laterality: N/A;   REPAIR KNEE LIGAMENT Left    "fixed ligaments and chipped patella"   right transmetatarsal amputation      TRACHEOSTOMY TUBE PLACEMENT N/A 07/10/2022   Procedure: CONTROLLED EXTUBATION;  Surgeon: Izora Gala, MD;  Location: [place];  Service: ENT;  Laterality: N/A;     Social History:   reports that she quit smoking about 17 years ago. Her smoking use  included cigarettes. She has a 3.75 pack-year smoking history. She has never used smokeless tobacco. She reports that she does not currently use drugs. She reports that she does not drink alcohol.   Family History:  Her family history includes Allergic rhinitis in her mother; Anxiety disorder in her sister; Asthma in her child; Cancer in her maternal grandmother; Depression in her mother; Diabetes in her maternal grandmother and mother; GER disease in her mother; Heart attack in her father, paternal grandfather, paternal grandmother, and paternal uncle; Heart disease in her paternal grandfather and paternal grandmother; Hyperlipidemia in her maternal grandmother and mother; Hypertension in her maternal grandmother; Migraines in her sister; Other in her maternal grandfather; Restless legs syndrome in her mother.   Allergies Allergies  Allergen Reactions   Cefepime Other (See Comments)    AKI, see records from [facility] hospitalization in January 2020.   Other reaction(s): Other (See Comments) "Shut down organs/kidneys"  Note pt has tolerated Rocephin and Keflex Other reaction(s): Not available   Iodine Other (See Comments)    Kidney dysfunction   Kiwi Extract Shortness Of Breath, Swelling and Anaphylaxis    Other reaction(s): Not available   Morphine And Related Nausea And Vomiting   Pentoxifylline Nausea And Vomiting    Trental   Cephalosporins Other (See Comments)    Pt states that they cause her kidneys to shut down   Toradol [Ketorolac Tromethamine] Other (See Comments)    Feels like something is crawling on me   Nubain [Nalbuphine Hcl] Other (See Comments)    "FEELS LIKE SOMETHING CRAWLING ON ME" Hallucinations      Home Medications  Prior to Admission medications   Medication Sig Start Date End Date Taking? Authorizing Provider  acetaminophen (TYLENOL) 325 MG tablet Take 1-2 tablets (325-650 mg total) by mouth every 4 (four) hours as needed for mild pain. 06/04/22  Yes Setzer, Edman Circle, PA-C  amLODipine (NORVASC) 10 MG tablet Take 1 tablet (10 mg total) by mouth daily. 09/27/22  Yes [name], MD  aspirin EC 81 MG tablet Take 1 tablet (81 mg total) by mouth daily. Swallow whole. 11/19/20  Yes Sherren Mocha, MD  Brinzolamide-Brimonidine [facility]) 1-0.2 % SUSP Place 1 drop into the left eye in the morning, at noon, and at bedtime.   Yes [provider]  cetirizine (ZYRTEC) 10 MG tablet Take 1 tablet (10 mg total) by mouth daily as needed for allergies. 02/11/22  Yes [name], MD  DULoxetine (CYMBALTA) 60 MG  capsule Take 2 capsules (120 mg total) by mouth daily. 12/13/22  Yes [name], MD  gabapentin (NEURONTIN) 600 MG tablet TAKE 2 TABLETS BY MOUTH 3 TIMES DAILY Patient taking differently: Take 1,200 mg by mouth 3 (three) times daily. 11/19/22  Yes [name], MD  insulin aspart (NOVOLOG FLEXPEN) 100 UNIT/ML FlexPen Inject 20 Units into the skin 3 (three) times daily with meals. Patient taking differently: Inject 30 Units into the skin 3 (three) times daily with meals. 05/25/22  Yes Alcus Dad, MD  insulin degludec (TRESIBA FLEXTOUCH) 200 UNIT/ML FlexTouch Pen Inject 52 Units into the skin 2 (two) times daily. Patient taking differently: Inject 60 Units into the skin 2 (two) times daily. 06/04/22  Yes Setzer, Edman Circle, PA-C  Insulin Disposable Pump (OMNIPOD 5 G6 POD, GEN 5,) MISC Inject into the skin daily. 07/30/22  Yes [provider]  ipratropium-albuterol (DUONEB) 0.5-2.5 (3) MG/3ML SOLN Take 3 mLs by nebulization every 6 (six) hours as needed. Patient taking differently: Take 3 mLs by nebulization every 6 (six) hours as needed (for shortness of breath and wheezing). 09/24/22  Yes Spero Geralds, MD  latanoprost (XALATAN) 0.005 % ophthalmic solution Place 1 drop into both eyes daily. 02/04/23  Yes [provider]  Melatonin 10 MG TABS Take 1 tablet by mouth at bedtime.   Yes [provider]   Multiple Vitamins-Minerals (ONE-A-DAY WOMENS) tablet Take 1 tablet by mouth daily.   Yes [provider]  nitroGLYCERIN (NITROSTAT) 0.4 MG SL tablet Place 1 tablet (0.4 mg total) under the tongue every 5 (five) minutes as needed for chest pain. 11/04/22  Yes [name], MD  nystatin (MYCOSTATIN/NYSTOP) powder Apply 1 application. topically 3 (three) times daily. 03/30/22  Yes [name], MD  ondansetron (ZOFRAN) 4 MG tablet Take 1 tablet (4 mg total) by mouth every 8 (eight) hours as needed for nausea or vomiting. 02/17/22  Yes Lovorn, Jinny Blossom, MD  pantoprazole (PROTONIX) 40 MG tablet Take 1 tablet (40 mg total) by mouth daily. 09/27/22  Yes [name], MD  PROAIR HFA 108 [phone] Base) MCG/ACT inhaler INHALE 1 TO 2 PUFFS BY MOUTH EVERY 6 HOURS AS NEEDED FOR WHEEZING OR SHORTNESS OF BREATH Patient taking differently: Inhale 2 puffs into the lungs every 6 (six) hours as needed for wheezing or shortness of breath. 10/05/22  Yes [name], MD  RHOPRESSA 0.02 % SOLN Place 1 drop into the left eye daily. 01/21/23  Yes [provider]  rOPINIRole (REQUIP) 1 MG tablet Take 1 tablet (1 mg total) by mouth at bedtime. 12/16/22  Yes [name], MD  rosuvastatin (CRESTOR) 40 MG tablet Take 1 tablet (40 mg total) by mouth daily. 11/04/22  Yes [name], MD  SYMBICORT 160-4.5 MCG/ACT inhaler INHALE 2 PUFFS into THE lungs 2 TIMES DAILY Patient taking differently: Inhale 2 puffs into the lungs 2 (two) times daily. 09/24/22  Yes [name], MD  torsemide (DEMADEX) 20 MG tablet Take 4 tablets (80 mg total) by mouth 2 (two) times daily. Patient taking differently: Take 80-100 mg by mouth See admin instructions. Take 5 tablets by mouth in the morning and 4 tablets in the evening 09/02/22  Yes Sherren Mocha, MD  traZODone (DESYREL) 100 MG tablet Take 1 tablet (100 mg total) by mouth at bedtime as needed. for sleep 12/13/22  Yes [name], MD  Vitamin D, Cholecalciferol, 25 MCG (1000 UT) CAPS Take 1,000 Units by mouth  daily. 10/05/22  Yes [name], MD  ammonium lactate (AMLACTIN DAILY) 12 % lotion Apply 1 Application topically as needed for dry skin. Patient not taking: Reported on 02/20/2023 01/20/23   [name], DPM  Continuous Blood Gluc Sensor (DEXCOM G6 SENSOR) MISC Inject 1 applicator into the skin as directed. Change sensor every 10 days. 11/05/21   [name], MD  Continuous Blood Gluc Transmit (DEXCOM G6 TRANSMITTER) MISC Inject 1 Device into the skin as directed. Reuse 8 times with sensor changes. 09/03/21   [name], MD  GNP ULTICARE PEN NEEDLES 32G X 4 MM MISC USE TO inject insulin 2 TIMES DAILY 10/02/21   [name], MD          JD Rexene Agent [facility] 02/23/2023, 7:27 AM  Please see [email or web address] for pager details.  From 7A-7P if no response, please call [phone]. After hours, please call ELink [phone].

## 2023-02-24 ENCOUNTER — Inpatient Hospital Stay (HOSPITAL_COMMUNITY): Payer: Medicaid Other

## 2023-02-24 DIAGNOSIS — J9600 Acute respiratory failure, unspecified whether with hypoxia or hypercapnia: Secondary | ICD-10-CM | POA: Diagnosis not present

## 2023-02-24 DIAGNOSIS — I509 Heart failure, unspecified: Secondary | ICD-10-CM | POA: Diagnosis not present

## 2023-02-24 LAB — BASIC METABOLIC PANEL
Anion gap: 6 (ref 5–15)
BUN: 41 mg/dL — ABNORMAL HIGH (ref 6–20)
CO2: 30 mmol/L (ref 22–32)
Calcium: 9 mg/dL (ref 8.9–10.3)
Chloride: 103 mmol/L (ref 98–111)
Creatinine, Ser: 0.93 mg/dL (ref 0.44–1.00)
GFR, Estimated: >60 mL/min (ref >60)
Glucose, Bld: 113 mg/dL — ABNORMAL HIGH (ref 70–99)
Potassium: 4.6 mmol/L (ref 3.5–5.1)
Sodium: 139 mmol/L (ref 135–145)

## 2023-02-24 LAB — CBC
HCT: 37.1 % (ref 36.0–46.0)
Hemoglobin: 10.9 g/dL — ABNORMAL LOW (ref 12.0–15.0)
MCH: 26.8 pg (ref 26.0–34.0)
MCHC: 29.4 g/dL — ABNORMAL LOW (ref 30.0–36.0)
MCV: 91.4 fL (ref 80.0–100.0)
Platelets: 235 10*3/uL (ref 150–400)
RBC: 4.06 MIL/uL (ref 3.87–5.11)
RDW: 16 % — ABNORMAL HIGH (ref 11.5–15.5)
WBC: 6.9 10*3/uL (ref 4.0–10.5)
nRBC: 0 % (ref 0.0–0.2)

## 2023-02-24 LAB — BLOOD GAS, VENOUS
Acid-Base Excess: 8.1 mmol/L — ABNORMAL HIGH (ref 0.0–2.0)
Bicarbonate: 38.1 mmol/L — ABNORMAL HIGH (ref 20.0–28.0)
O2 Saturation: 42 %
Patient temperature: 36.5
pCO2, Ven: 79 mmHg (ref 44–60)
pH, Ven: 7.29 (ref 7.25–7.43)
pO2, Ven: <31 mmHg — CL (ref 32–45)

## 2023-02-24 LAB — GLUCOSE, CAPILLARY
Glucose-Capillary: 100 mg/dL — ABNORMAL HIGH (ref 70–99)
Glucose-Capillary: 119 mg/dL — ABNORMAL HIGH (ref 70–99)
Glucose-Capillary: 78 mg/dL (ref 70–99)
Glucose-Capillary: 91 mg/dL (ref 70–99)
Glucose-Capillary: 99 mg/dL (ref 70–99)

## 2023-02-24 LAB — TROPONIN I (HIGH SENSITIVITY)
Troponin I (High Sensitivity): 28 ng/L — ABNORMAL HIGH (ref <18)
Troponin I (High Sensitivity): 26 ng/L — ABNORMAL HIGH (ref <18)

## 2023-02-24 MED ORDER — NITROGLYCERIN 0.4 MG SL SUBL
0.4000 mg | SUBLINGUAL_TABLET | SUBLINGUAL | Status: DC | PRN
  Administered 2023-02-24 (×3): 0.4 mg via SUBLINGUAL
  Filled 2023-02-24: qty 1

## 2023-02-24 MED ORDER — HYDROMORPHONE HCL 1 MG/ML IJ SOLN
1.0000 mg | INTRAMUSCULAR | Status: DC | PRN
  Administered 2023-02-24 – 2023-02-26 (×2): 1 mg via INTRAVENOUS
  Filled 2023-02-24 (×2): qty 1

## 2023-02-24 MED ORDER — ACETAMINOPHEN 325 MG PO TABS
650.0000 mg | ORAL_TABLET | Freq: Four times a day (QID) | ORAL | Status: DC | PRN
  Filled 2023-02-24: qty 2

## 2023-02-24 MED ORDER — SODIUM CHLORIDE 0.9 % IV SOLN
12.5000 mg | Freq: Four times a day (QID) | INTRAVENOUS | Status: DC | PRN
  Administered 2023-02-26: 12.5 mg via INTRAVENOUS
  Filled 2023-02-24 (×2): qty 0.5

## 2023-02-24 MED ORDER — NITROGLYCERIN 0.4 MG SL SUBL
SUBLINGUAL_TABLET | SUBLINGUAL | Status: AC
  Filled 2023-02-24: qty 1

## 2023-02-24 MED ORDER — NITROGLYCERIN 0.4 MG SL SUBL
SUBLINGUAL_TABLET | SUBLINGUAL | Status: AC
  Administered 2023-02-24: 0.4 mg via SUBLINGUAL
  Filled 2023-02-24: qty 1

## 2023-02-24 NOTE — Progress Notes (Signed)
Pt taken off bipap at this time and placed on 4L [place]. Pt denies SOB, no Increased wob. VS WNL RT Will continue to monitor for possible need to go back on bipap today

## 2023-02-24 NOTE — Progress Notes (Signed)
FMTS Brief Progress Note  S: Went bedside to see patient for night rounding, patient alert and oriented x 3, responsive and easily arousable. Later, received page from RN that patient CBG trending down, w/ request to start D5 drip at low rate. Spoke w/ nurse and recommended short time off BiPAP to eat.    O: BP (!) 146/79 (BP Location: Left Wrist)   Pulse 95   Temp 97.9 F (36.6 C) (Axillary)   Resp 14   Ht 5\' 2"  (1.575 m)   Wt (!) 169.1 kg   SpO2 92%   BMI 68.19 kg/m     A/P: CHF exacerbation  Patient currently admitted for CHF exacerbation w/ breathing issues requiring BiPAP likely 2/2 volume status and OHS/OSA, thus do not want to give fluids. She is alert and oriented x 3 this evening and reports her breathing is good. Plan to continue diuresis and remain on BiPAP overnight.  -Plans per day team -Continue BiPAP  Down Trending CBG's Patient at risk for hypoglycemia given down trending CBG's and lack of PO today. Patient NPO while on BiPAP. RN requesting starting D5 drip at low rate, but wanted to avoid giving IVF given volume status and breathing issues. Given patient's improved mental status, will allow for short time off BiPAP to eat, and then will return to BiPAP. If patient does not eat, will hold insulin tonight. If patient able to eat, will continue insulin as scheduled. Plans discussed with RT and are appropriate.  -Short time off BiPAP to eat, then return to BiPAP for the night -Continue Insulin as scheduled, unless patient does not eat in which case we will hold.  - Orders reviewed. Labs for AM ordered, which was adjusted as needed.   Holley Bouche, MD 02/24/2023, 8:25 PM PGY-2, [facility] Night Resident  Please page [phone] with questions.

## 2023-02-24 NOTE — Progress Notes (Signed)
eLink Physician-Brief Progress Note Patient Name: [name] DOB: 07-27-1976 MRN: [id number]   Date of Service  02/24/2023  HPI/Events of Note  47 year old female with a history of morbid obesity complicated by chronic hypoxic and hypercapnic respiratory failure due to obstructive sleep apnea and obesity hypoventilation syndrome in setting of COPD and heart failure on chronic 5 L of oxygen now was transferred to the floor earlier today and placed on 2 L.  Patient desaturated overnight from 88 and 91% and had an increasing oxygen requirement from 2 to 3 L.  Patient appears to show some increased work of breathing  eICU Interventions  Chest radiograph and ABG are ordered.  Given that her chronic oxygen requirement is roughly 5 L, it is unsurprising that she feels somewhat short of breath at nighttime on 2 L.  Reasonable to increase oxygen supplementation.  Will reobserve after testing is complete.  Patient is sleeping comfortably on examination with questionable evidence of respiratory distress.  Exam is confounded by morbid obesity.     Intervention Category Minor Interventions: Clinical assessment - ordering diagnostic tests  Ronit Marczak 02/24/2023, 4:14 AM

## 2023-02-24 NOTE — Progress Notes (Signed)
   02/24/23 0547  BiPAP/CPAP/SIPAP  $ Non-Invasive Home Ventilator  Initial  $ Face Mask Large  Yes  BiPAP/CPAP/SIPAP Pt Type Adult  BiPAP/CPAP/SIPAP DREAMSTATIOND  Mask Type Full face mask  Mask Size Large  Respiratory Rate 15 breaths/min  IPAP  (auto 20 max min 6)  Flow Rate 8 lpm  Patient Home Equipment No  Auto Titrate No   Called to pt. Room to placed pt. On cpap pt was having chest pain, pt sats 95% on 3L 97HR RR16 placed pt on cpap pt. Tolerating current settings.

## 2023-02-24 NOTE — Progress Notes (Signed)
Daily Progress Note Intern Pager: [phone]  Patient name: [name] Medical record number: [id number] Date of birth: [birth year] Age: 47 y.o. Gender: female  Primary Care Provider: [name], MD Consultants: CCM Code Status: Full code  Pt Overview and Major Events to Date:  3/17: Admitted to FMTS  3/18: Admitted to ICU for hypercapnia and somnolence 3/21: Transferred back to FMTS  Assessment and Plan: [name] is a 47 y.o. female presenting with shortness of breath. PMHx includes HfpEF (recent ECHO have not been able to assess EF or diastolic function), OHS, OSA, COPD, HTN, T2DM.   * Shortness of breath On BiPap in the ICU but was weaned down to [place] prior to transfer to FMTS. On 5L [place] this AM but upon exam patient intermittently gasping and with altered mentation unable to answer orientation questions. Obtained VBG which showed CO2 79, O2 30 and requested patient be placed on BiPap. Weight trending up 13kg since admission but possibly inaccurate due to different beds. ~3L UOP yesterday but difficult to assess volume status given body habitus. SOB most likely multifactorial including HF exacerbation and underlying OHS/OSA. -Transition to BiPap -Lasix 40mg  IV BID for diuresis -Strict I/Os -Daily Weights  Chronic kidney disease, stage 3a ([name]) Cr 0.9, back at baseline. -Avoid nephrotoxic agents  Type 2 diabetes mellitus with hyperglycemia ([name]) Well controlled, BGL 100s. Home meds: tresiba 60u BID, aspart 30u TID with meals. Patient substituted Gabapentin 1200mg  home dosing for Lyrica 100mg  TID due to sedative effect. -CBG monitoring with meals and bedtime -Semglee 30u BID -rSSI   Chronic and stable conditions: HLD: continue Crestor 40mg  COPD: Dulera (substitute for Symbicort) GERD: continue Protonix 40mg  Diabetic neuropathy: continue Duloxetine 120mg  daily HTN: continue home Amlodipine 10mg  OSA: continue BiPap nightly Open angle glaucoma: continue  Alphagan TID and Azopt TID   FEN/GI: Heart healthy/carb modified PPx: Lovenox Dispo: Home pending clinical improvement  Subjective:  Patient assessed at bedside, patient difficult to arouse. Unable to respond to orientation questions and kept repeating "yes". Would intermittently close her eyes during conversation and need to be aroused again. Able to squeeze hand when requested.  Objective: Temp:  [97.9 F (36.6 C)-98.3 F (36.8 C)] 97.9 F (36.6 C) (03/21 0512) Pulse Rate:  [88-98] 92 (03/21 0810) Resp:  [12-22] 12 (03/21 0810) BP: (74-161)/(50-122) 107/66 (03/21 0810) SpO2:  [85 %-100 %] 88 % (03/21 0810) Weight:  [169.1 kg] 169.1 kg (03/21 0500) Physical Exam: General: Laying on side, intermittently gasping on [place], NAD Cardiovascular: RRR without murmur Respiratory: Difficult to assess due to body habitus Abdomen: Soft, non-distended Extremities: Compression socks in place, no appreciable peripheral edema Neuro: Aox0, Squeezes hand upon command.  Laboratory: Most recent CBC Lab Results  Component Value Date   WBC 6.9 02/24/2023   HGB 10.9 (L) 02/24/2023   HCT 37.1 02/24/2023   MCV 91.4 02/24/2023   PLT 235 02/24/2023   Most recent BMP    Latest Ref Rng & Units 02/24/2023    4:37 AM  BMP  Glucose 70 - 99 mg/dL 113   BUN 6 - 20 mg/dL 41   Creatinine 0.44 - 1.00 mg/dL 0.93   Sodium 135 - 145 mmol/L 139   Potassium 3.5 - 5.1 mmol/L 4.6   Chloride 98 - 111 mmol/L 103   CO2 22 - 32 mmol/L 30   Calcium 8.9 - 10.3 mg/dL 9.0     Other pertinent labs: Troponin: 28 Glu: 119  Imaging/Diagnostic Tests: DG CHEST PORT 1 VIEW  Result Date: 02/24/2023 IMPRESSION: 1. Cardiomegaly with pulmonary venous congestion, but no frank pulmonary edema. 2. Mild elevation of the right hemidiaphragm.   ECHOCARDIOGRAM COMPLETE Result Date: 02/22/2023 IMPRESSIONS  1. Left ventricular ejection fraction, by estimation, is 55 to 60%. The left ventricle has normal function. Left ventricular  endocardial border not optimally defined to evaluate regional wall motion. Left ventricular diastolic parameters are consistent with Grade II diastolic dysfunction (pseudonormalization).  2. Right ventricular systolic function was not well visualized. The right ventricular size is not well visualized. Tricuspid regurgitation signal is inadequate for assessing PA pressure.  3. The mitral valve was not well visualized. No evidence of mitral valve regurgitation.  4. The aortic valve was not well visualized. Aortic valve regurgitation is not visualized. No aortic stenosis is present.  5. The inferior vena cava is dilated in size with <50% respiratory variability, suggesting right atrial pressure of 15 mmHg.  6. Technically difficult study with very poor acoustic windows. The right side of the heart was minimally visualized.   Colletta Maryland, MD 02/24/2023, 11:48 AM  PGY-1, [place] Intern pager: [phone], text pages welcome Secure chat group [place]

## 2023-02-24 NOTE — Progress Notes (Signed)
RT received call in regards to patient's venous blood gas results.  Patient was placed on dreamstation, on bipap settings of 20/8 with 5L oxygen bleed in.  Patient tolerating well at this time. Will continue to monitor.

## 2023-02-24 NOTE — Progress Notes (Addendum)
eLink Physician-Brief Progress Note Patient Name: [name] DOB: Jul 02, 1976 MRN: [id number]   Date of Service  02/24/2023  HPI/Events of Note  47 year old female with a history of morbid obesity, chronic hypoxic hypercapnic respiratory failure and OSA with OHS on chronic 5 L of oxygen therapy.  She had sudden onset chest pain (8/10) this morning at 520.  She had central chest pain extending to left normal with nonexertional features.  Nothing seems to alleviate the pain in terms of positioning or respiratory support.  She was not exerting herself when it began and vitals are otherwise stable.  Resting does not seem to alleviate the pain.  Nitroglycerin has been ordered, will assess to see if this alleviates the discomfort  EKG ordered, no evidence of ST elevations, no new T wave abnormalities  eICU Interventions  Troponins were ordered, attempting nitroglycerin, rapid response has been initiated.  Pain went down from 8 out of 10 to 6 out of 10 with 2 nitros, third nitro going in now.  Developed mild headache, requesting acetaminophen.  Ordered.  Repeat EKG obtained.  May need morphine or opiod PRN for chest pain    0615 - again having 8/10 chest pain, had moderate response to nitroglycerin before.  Attempted nitroglycerin again with minimal response this time.  Vital signs are stable.  Will attempt Dilaudid in place of at this time.  Has a history of nausea and vomiting with opiates, will prescribe promethazine liquid.  Intervention Category Major Interventions: Other: (ACS)  Ellianna Ruest 02/24/2023, 5:24 AM

## 2023-02-24 NOTE — Progress Notes (Signed)
OT Cancellation Note  Patient Details Name: [name] MRN: [id number] DOB: 1976/07/07   Cancelled Treatment:    Reason Eval/Treat Not Completed: Patient declined, no reason specified;Other (comment) Noted pt has been declining OOB activity participation with therapy due to not having prosthetic LE. Checked with pt again this AM though no prosthetic in room. Pt drowsy but easily awakened to discuss plan to sign off for therapy acutely. Please reconsult if pt prosthetic brought to hospital and/or pt agreeable to participate with therapy w/o prosthetic.   Layla Maw 02/24/2023, 8:34 AM

## 2023-02-25 DIAGNOSIS — Z794 Long term (current) use of insulin: Secondary | ICD-10-CM

## 2023-02-25 DIAGNOSIS — E1165 Type 2 diabetes mellitus with hyperglycemia: Secondary | ICD-10-CM

## 2023-02-25 LAB — GLUCOSE, CAPILLARY
Glucose-Capillary: 76 mg/dL (ref 70–99)
Glucose-Capillary: 125 mg/dL — ABNORMAL HIGH (ref 70–99)
Glucose-Capillary: 132 mg/dL — ABNORMAL HIGH (ref 70–99)
Glucose-Capillary: 203 mg/dL — ABNORMAL HIGH (ref 70–99)

## 2023-02-25 LAB — BASIC METABOLIC PANEL
Anion gap: 7 (ref 5–15)
BUN: 34 mg/dL — ABNORMAL HIGH (ref 6–20)
CO2: 33 mmol/L — ABNORMAL HIGH (ref 22–32)
Calcium: 9 mg/dL (ref 8.9–10.3)
Chloride: 100 mmol/L (ref 98–111)
Creatinine, Ser: 1 mg/dL (ref 0.44–1.00)
GFR, Estimated: >60 mL/min (ref >60)
Glucose, Bld: 77 mg/dL (ref 70–99)
Potassium: 4.1 mmol/L (ref 3.5–5.1)
Sodium: 140 mmol/L (ref 135–145)

## 2023-02-25 LAB — CBC
HCT: 38.2 % (ref 36.0–46.0)
Hemoglobin: 11.4 g/dL — ABNORMAL LOW (ref 12.0–15.0)
MCH: 27 pg (ref 26.0–34.0)
MCHC: 29.8 g/dL — ABNORMAL LOW (ref 30.0–36.0)
MCV: 90.3 fL (ref 80.0–100.0)
Platelets: 271 10*3/uL (ref 150–400)
RBC: 4.23 MIL/uL (ref 3.87–5.11)
RDW: 16.1 % — ABNORMAL HIGH (ref 11.5–15.5)
WBC: 7.1 10*3/uL (ref 4.0–10.5)
nRBC: 0 % (ref 0.0–0.2)

## 2023-02-25 MED ORDER — TORSEMIDE 100 MG PO TABS
100.0000 mg | ORAL_TABLET | Freq: Two times a day (BID) | ORAL | Status: DC
  Administered 2023-02-25 – 2023-02-26 (×2): 100 mg via ORAL
  Filled 2023-02-25 (×3): qty 1

## 2023-02-25 NOTE — Progress Notes (Addendum)
     Daily Progress Note Intern Pager: [phone]  Patient name: [name] Medical record number: [id number] Date of birth: 11-06-1976 Age: 47 y.o. Gender: female  Primary Care Provider: [name], MD Consultants: CCM Code Status: Full code   Pt Overview and Major Events to Date:  3/17: Admitted to FMTS  3/18: Admitted to ICU for hypercapnia and somnolence 3/21: Transferred back to FMTS   Assessment and Plan: [name] is a 47 y.o. female presenting with shortness of breath. PMHx includes HfpEF (recent ECHO have not been able to assess EF or diastolic function), OHS, OSA, COPD, HTN, T2DM.   * Shortness of breath On Bipap all day yesterday but transitioned to [place] this AM. Now in 4L [place] and doing well. Aox4. States she only uses 2L [place] at home. 1.6L UOP during the day but non charted overnight. Patient reports additional ~3L in [place] this AM. -On 4L [place], wean as tolerated -Transition to Torsemide 100mg  BID -Strict I/Os -Daily Weights  Type 2 diabetes mellitus with hyperglycemia ([name]) BGL < 100 but patient has been NPO. Restart diet this AM and monitor closely given improving respiratory status. -CBG monitoring with meals and bedtime -Semglee 30u BID -rSSI   Chronic and stable conditions: HLD: continue Crestor 40mg  COPD: Dulera (substitute for Symbicort) GERD: continue Protonix 40mg  Diabetic neuropathy: continue Duloxetine 120mg  daily HTN: continue home Amlodipine 10mg  OSA: continue BiPap nightly Open angle glaucoma: continue Alphagan TID and Azopt TID     FEN/GI: Heart healthy/carb modified PPx: Lovenox Dispo: Home pending clinical improvement  Subjective:  Patient assessed at bedside  Objective: Temp:  [97.6 F (36.4 C)-97.9 F (36.6 C)] 97.9 F (36.6 C) (03/21 2000) Pulse Rate:  [82-98] 90 (03/21 2356) Resp:  [13-20] 14 (03/21 2000) BP: (112-162)/(71-101) 156/78 (03/22 1008) SpO2:  [92 %-100 %] 98 % (03/22 0719) Physical Exam: General: Sitting up  in bed, [place] in place, NAD Cardiovascular: RRR without murmur Respiratory: Difficult to assess due to body habitus. Normal WOB on 4L  Abdomen: Soft, non-tender Extremities: Compression socks in place, no appreciable peripheral edema Neuro: Aox4, motor and sensation intact globally  Laboratory: Most recent CBC Lab Results  Component Value Date   WBC 7.1 02/25/2023   HGB 11.4 (L) 02/25/2023   HCT 38.2 02/25/2023   MCV 90.3 02/25/2023   PLT 271 02/25/2023   Most recent BMP    Latest Ref Rng & Units 02/25/2023    6:18 AM  BMP  Glucose 70 - 99 mg/dL 77   BUN 6 - 20 mg/dL 34   Creatinine 0.44 - 1.00 mg/dL 1.00   Sodium 135 - 145 mmol/L 140   Potassium 3.5 - 5.1 mmol/L 4.1   Chloride 98 - 111 mmol/L 100   CO2 22 - 32 mmol/L 33   Calcium 8.9 - 10.3 mg/dL 9.0     Other pertinent labs: Glu: 78   Colletta Maryland, MD 02/25/2023, 11:47 AM  PGY-1, [place] Intern pager: [phone], text pages welcome Secure chat group [place]

## 2023-02-25 NOTE — Hospital Course (Addendum)
[name] is a 47 y.o.female with a history of HfpEF (recent ECHO have not been able to assess EF or diastolic function), OHS, OSA, COPD, HTN, T2DM who was admitted to the [facility] Teaching [facility] at [facility] for SOB. Her hospital course is detailed below:  HF exacerbation Acute on chronic respiratory failure Admitted with worsening SOB despite wearing her BiPap all day at home. In the ED, patient was placed on BiPAP with some improvement in her respiratory status. CXR showed mild pulmonary edema. 80 mg of Lasix was ordered in the ED. She was weaned to [facility] with BiPAP overnight on 3/18 with good urine output from the Lasix, however later in the day she had increased confusion and was not oriented. She was placed back on BiPAP, pCO2 resulted at 80 > 85. PCCM was consulted and transferred her to the ICU for monitoring on 3/18 due to her high risk for intubation. She remained in ICU care until 3/21, when she was transferred back to FMTS. She remained stable on BiPAP and did not require intubation.   Other chronic conditions were medically managed with home medications and formulary alternatives as necessary (HLD, COPD, GERD, HTN, OSA)  PCP Follow-up Recommendations:  Follow up therapy/counseling resources, encourage connection Ensure connection with outpatient PT Gabapentin stopped in favor of Lyrica 100 mg TID. Please assess response.  Torsemide increased from 80 mg to 100 mg daily. PCP to check electrolytes and fluid status.

## 2023-02-25 NOTE — Discharge Instructions (Signed)
Therapy and Counseling Resources Most providers on this list will take Medicaid. Patients with commercial insurance or Medicare should contact their insurance company to get a list of in network providers.  [employer] (takes children) Location 1: [address] [place], [place] [address] Location 2: [place], [place] [address] [place] ([place] speaking therapist available)(habla espanol)(take medicare and medicaid)  [place], [place], [place] [address], [place] [email or web address] [phone]  [facility]iatry and Counseling [address] [place], [place] [address] [place], [place], [place] [address]      [phone]  [place] (spanish available) [place], [place] [address] [place] (take [facility] and medicare) [address] [place], [place] [address]       [phone]     [place] (virtual only) [phone]  Jinny Blossom [facility] [address] [place], [place]  [facility]:  [place]. [place] [phone]  [facility]:  [place] [address] [phone]  [facility] (under & uninsured) [address], [place] [phone]    [email or web address]    [place] [address]  [name]    [phone]  [facility] [place]     Phone:  [phone]     [place]  Sisters #1 [address]      [place], [place]: [place], [place], [place]   [place] ([place] therapist) [email or web address]  [place] [address]   [place] [address]    [phone]    [facility]   [address],  [place], [place]   [place]  [place]  [facility]  [address] [place], [place]        [phone]  Open Access/Walk In Clinic under & uninsured  [facility]  [address] [place], [place] Crisis [phone]  [facility] [place],  ([place])   [place], [place]: [phone]) 8:30 - 12; 1 - 2:30  [facility] [place],  [place], [place]    ([phone]):8:30 - 12; 2 - 3PM  [facility] [employer],  [address]  [place]; [phone]):   Mon - Fri 8 AM - 5 PM  [facility] [place]  MWF 12:30 to 3:00 or call to schedule an appointment  [phone]  Specific Provider options Psychology Today  [email or web address] click on find a therapist  enter your zip code left side and select or tailor a therapist for your specific need.   [facility] Provider Directory [email or web address]  (Medicaid)   Follow all drop down to find a provider  [place] or [email or web address] [address], [name], [place] Recovery support and educational   24- Hour Availability:   [facility]  [address] [place], [place] Crisis [place]  Crisis Line [phone]  [facility] Crisis Service  [place]  [phone] (after hours)  Therapeutic Alternative/Mobile Crisis   [phone]  [place] National Suicide Hotline  5753448535 Diamantina Monks)  Call 911 or go to emergency room  [facility]  [phone]);  [place] and [employer]  [phone]); [place], [place], [place], [place], [place], [place], [place]

## 2023-02-26 LAB — BASIC METABOLIC PANEL
Anion gap: 5 (ref 5–15)
BUN: 29 mg/dL — ABNORMAL HIGH (ref 6–20)
CO2: 35 mmol/L — ABNORMAL HIGH (ref 22–32)
Calcium: 9.2 mg/dL (ref 8.9–10.3)
Chloride: 99 mmol/L (ref 98–111)
Creatinine, Ser: 0.95 mg/dL (ref 0.44–1.00)
GFR, Estimated: >60 mL/min (ref >60)
Glucose, Bld: 186 mg/dL — ABNORMAL HIGH (ref 70–99)
Potassium: 4.4 mmol/L (ref 3.5–5.1)
Sodium: 139 mmol/L (ref 135–145)

## 2023-02-26 LAB — GLUCOSE, CAPILLARY
Glucose-Capillary: 177 mg/dL — ABNORMAL HIGH (ref 70–99)
Glucose-Capillary: 135 mg/dL — ABNORMAL HIGH (ref 70–99)
Glucose-Capillary: 92 mg/dL (ref 70–99)

## 2023-02-26 MED ORDER — LIDOCAINE 5 % EX PTCH: 1.0000 | MEDICATED_PATCH | CUTANEOUS | 0 refills | Status: DC

## 2023-02-26 MED ORDER — DICLOFENAC SODIUM 1 % EX GEL: 2.0000 g | Freq: Four times a day (QID) | CUTANEOUS | 0 refills | Status: DC

## 2023-02-26 MED ORDER — TORSEMIDE 100 MG PO TABS: 100.0000 mg | ORAL_TABLET | Freq: Two times a day (BID) | ORAL | 0 refills | Status: DC

## 2023-02-26 MED ORDER — PREGABALIN 100 MG PO CAPS: 100.0000 mg | ORAL_CAPSULE | Freq: Three times a day (TID) | ORAL | 1 refills | Status: DC

## 2023-02-26 NOTE — Progress Notes (Signed)
Patient refused noon vital sign check. For discharge later today.

## 2023-02-26 NOTE — Progress Notes (Signed)
Patient picked up by [facility] for transportation home. AVS provided.

## 2023-02-26 NOTE — Discharge Summary (Signed)
[facility] Discharge Summary  Patient name: [name] Medical record number: [id number] Date of birth: Apr 23, 1976 Age: 47 y.o. Gender: female Date of Admission: 02/20/2023  Date of Discharge: 02/26/2023 Admitting Physician: Colletta Maryland, MD  Primary Care Provider: [name], MD Consultants: PCCM   Indication for Hospitalization: Shortness of breath, CHF exacerbation  Brief Hospital Course:  [name] is a 47 y.o.female with a history of HfpEF (recent ECHO have not been able to assess EF or diastolic function), OHS, OSA, COPD, HTN, T2DM who was admitted to the [facility] Medicine Teaching Service at [facility] for SOB. Her hospital course is detailed below:  HF exacerbation Acute on chronic respiratory failure Admitted with worsening SOB despite wearing her BiPap all day at home. In the ED, patient was placed on BiPAP with some improvement in her respiratory status. CXR showed mild pulmonary edema. 80 mg of Lasix was ordered in the ED. She was weaned to [facility] with BiPAP overnight on 3/18 with good urine output from the Lasix, however later in the day she had increased confusion and was not oriented. She was placed back on BiPAP, pCO2 resulted at 80 > 85. PCCM was consulted and transferred her to the ICU for monitoring on 3/18 due to her high risk for intubation. She remained in ICU care until 3/21, when she was transferred back to [facility]. She remained stable on BiPAP and did not require intubation.   Other chronic conditions were medically managed with home medications and formulary alternatives as necessary (HLD, COPD, GERD, HTN, OSA)  PCP Follow-up Recommendations:  Follow up therapy/counseling resources, encourage connection Ensure connection with outpatient PT Gabapentin stopped in favor of Lyrica 100 mg TID. Please assess response.  Torsemide increased from 80 mg to 100 mg daily. PCP to check electrolytes and fluid status.   Discharge  Diagnoses/Problem List:  CHF exacerbation Type 2 diabetes with hyperglycemia COPD HTN OSA HLD GERD  Disposition: home with outpatient PT  Discharge Condition: stable  Discharge Exam:  Vitals:   02/26/23 0406 02/26/23 0814  BP: (!) 165/98 (!) 141/83  Pulse: 96 87  Resp: 20 20  Temp: 98 F (36.7 C) 98.1 F (36.7 C)  SpO2: 97% 93%   GEN: Sleeping comfortably on CPAP, no acute distress Resp: Breathing comfortably, no retractions, lungs clear to auscultation (although technically difficult due to body habitus) Cardiac: Regular rate and rhythm, no murmurs Extremities: No BLE edema, bilateral amputations noted  Significant Procedures: None  Significant Labs and Imaging:  Recent Labs  Lab 02/25/23 0618  WBC 7.1  HGB 11.4*  HCT 38.2  PLT 271   Recent Labs  Lab 02/25/23 0618 02/26/23 0023  NA 140 139  K 4.1 4.4  CL 100 99  CO2 33* 35*  GLUCOSE 77 186*  BUN 34* 29*  CREATININE 1.00 0.95  CALCIUM 9.0 9.2   CXR 3/17 IMPRESSION: Low lung volumes with findings that may represent mild pulmonary vascular congestion.  ECHO 3/19 IMPRESSIONS   1. Left ventricular ejection fraction, by estimation, is 55 to 60%. The  left ventricle has normal function. Left ventricular endocardial border  not optimally defined to evaluate regional wall motion. Left ventricular  diastolic parameters are consistent with Grade II diastolic dysfunction (pseudonormalization).   2. Right ventricular systolic function was not well visualized. The right  ventricular size is not well visualized. Tricuspid regurgitation signal is  inadequate for assessing PA pressure.   3. The mitral valve was not well visualized. No evidence  of mitral valve  regurgitation.   4. The aortic valve was not well visualized. Aortic valve regurgitation  is not visualized. No aortic stenosis is present.   5. The inferior vena cava is dilated in size with <50% respiratory  variability, suggesting right atrial pressure  of 15 mmHg.   6. Technically difficult study with very poor acoustic windows. The right  side of the heart was minimally visualized.   CXR 3/21 IMPRESSION: 1. Cardiomegaly with pulmonary venous congestion, but no frank pulmonary edema. 2. Mild elevation of the right hemidiaphragm.  Results/Tests Pending at Time of Discharge: None  Discharge Medications:  Allergies as of 02/26/2023       Reactions   Cefepime Other (See Comments)   AKI, see records from [facility] hospitalization in January 2020.   Other reaction(s): Other (See Comments) "Shut down organs/kidneys" Note pt has tolerated Rocephin and Keflex Other reaction(s): Not available   Iodine Other (See Comments)   Kidney dysfunction   Kiwi Extract Shortness Of Breath, Swelling, Anaphylaxis   Other reaction(s): Not available   Morphine And Related Nausea And Vomiting   Pentoxifylline Nausea And Vomiting   Trental   Cephalosporins Other (See Comments)   Pt states that they cause her kidneys to shut down   Toradol [ketorolac Tromethamine] Other (See Comments)   Feels like something is crawling on me   Nubain [nalbuphine Hcl] Other (See Comments)   "FEELS LIKE SOMETHING CRAWLING ON ME" Hallucinations         Medication List     STOP taking these medications    ammonium lactate 12 % lotion Commonly known as: Amlactin Daily   gabapentin 600 MG tablet Commonly known as: NEURONTIN       TAKE these medications    acetaminophen 325 MG tablet Commonly known as: TYLENOL Take 1-2 tablets (325-650 mg total) by mouth every 4 (four) hours as needed for mild pain.   amLODipine 10 MG tablet Commonly known as: NORVASC Take 1 tablet (10 mg total) by mouth daily.   aspirin EC 81 MG tablet Take 1 tablet (81 mg total) by mouth daily. Swallow whole.   cetirizine 10 MG tablet Commonly known as: ZYRTEC Take 1 tablet (10 mg total) by mouth daily as needed for allergies.   Dexcom G6 Sensor Misc Inject 1 applicator into the  skin as directed. Change sensor every 10 days.   Dexcom G6 Transmitter Misc Inject 1 Device into the skin as directed. Reuse 8 times with sensor changes.   diclofenac Sodium 1 % Gel Commonly known as: Voltaren Apply 2 g topically 4 (four) times daily.   DULoxetine 60 MG capsule Commonly known as: CYMBALTA Take 2 capsules (120 mg total) by mouth daily.   GNP UltiCare Pen Needles 32G X 4 MM Misc Generic drug: Insulin Pen Needle USE TO inject insulin 2 TIMES DAILY   ipratropium-albuterol 0.5-2.5 (3) MG/3ML Soln Commonly known as: DUONEB Take 3 mLs by nebulization every 6 (six) hours as needed. What changed: reasons to take this   latanoprost 0.005 % ophthalmic solution Commonly known as: XALATAN Place 1 drop into both eyes daily.   lidocaine 5 % Commonly known as: LIDODERM Place 1 patch onto the skin daily. Remove & Discard patch within 12 hours or as directed by MD Start taking on: February 27, 2023   Melatonin 10 MG Tabs Take 1 tablet by mouth at bedtime.   nitroGLYCERIN 0.4 MG SL tablet Commonly known as: NITROSTAT Place 1 tablet (0.4 mg total) under  the tongue every 5 (five) minutes as needed for chest pain.   NovoLOG FlexPen 100 UNIT/ML FlexPen Generic drug: insulin aspart Inject 20 Units into the skin 3 (three) times daily with meals. What changed: how much to take   nystatin powder Commonly known as: MYCOSTATIN/NYSTOP Apply 1 application. topically 3 (three) times daily.   Omnipod 5 G6 Pods (Gen 5) Misc Inject into the skin daily.   ondansetron 4 MG tablet Commonly known as: Zofran Take 1 tablet (4 mg total) by mouth every 8 (eight) hours as needed for nausea or vomiting.   One-A-Day Womens tablet Take 1 tablet by mouth daily.   pantoprazole 40 MG tablet Commonly known as: PROTONIX Take 1 tablet (40 mg total) by mouth daily.   pregabalin 100 MG capsule Commonly known as: LYRICA Take 1 capsule (100 mg total) by mouth 3 (three) times daily.   ProAir HFA  108 (90 Base) MCG/ACT inhaler Generic drug: albuterol INHALE 1 TO 2 PUFFS BY MOUTH EVERY 6 HOURS AS NEEDED FOR WHEEZING OR SHORTNESS OF BREATH What changed: See the new instructions.   Rhopressa 0.02 % Soln Generic drug: Netarsudil Dimesylate Place 1 drop into the left eye daily.   rOPINIRole 1 MG tablet Commonly known as: REQUIP Take 1 tablet (1 mg total) by mouth at bedtime.   rosuvastatin 40 MG tablet Commonly known as: Crestor Take 1 tablet (40 mg total) by mouth daily.   Simbrinza 1-0.2 % Susp Generic drug: Brinzolamide-Brimonidine Place 1 drop into the left eye in the morning, at noon, and at bedtime.   Symbicort 160-4.5 MCG/ACT inhaler Generic drug: budesonide-formoterol INHALE 2 PUFFS into THE lungs 2 TIMES DAILY   torsemide 100 MG tablet Commonly known as: DEMADEX Take 1 tablet (100 mg total) by mouth 2 (two) times daily. What changed:  medication strength how much to take   traZODone 100 MG tablet Commonly known as: DESYREL Take 1 tablet (100 mg total) by mouth at bedtime as needed. for sleep   Tyler Aas FlexTouch 200 UNIT/ML FlexTouch Pen Generic drug: insulin degludec Inject 52 Units into the skin 2 (two) times daily. What changed: how much to take   Vitamin D (Cholecalciferol) 25 MCG (1000 UT) Caps Take 1,000 Units by mouth daily.               Durable Medical Equipment  (From admission, onward)           Start     Ordered   02/23/23 1300  For home use only DME Other see comment  Once       Comments: Portable Oxygen Concentrator Evaluation.  Question:  Length of Need  Answer:  Lifetime   02/23/23 1301           Discharge Instructions: Please refer to Patient Instructions section of EMR for full details.  Patient was counseled important signs and symptoms that should prompt return to medical care, changes in medications, dietary instructions, activity restrictions, and follow up appointments.   Follow-Up Appointments:  Follow-up  Information     [facility] Follow up.   Specialty: Rehabilitation Why: they will call you to set up apt times, if you do not hear from them in 3 business days please give them a call Contact information: [address] [id number] mc [place] Kersey, Brittany J, MD. Go on 03/07/2023.   Specialty: Family Medicine Why: At 11:30 am. This is your  hospital follow up appointment with your primary care doctor. Please arrive 15 minutes early to check in. Contact information: [place] [address] [phone]                Ezequiel Essex, MD 02/26/2023, 4:50 PM PGY-3, [place]

## 2023-02-26 NOTE — Progress Notes (Signed)
CSW received message from MD stating patient needs [facility] to transport her home.   CSW called [facility] for pick up at 3pm.  CSW notified RN of information.  Madilyn Fireman, MSW, LCSW Transitions of Care  Clinical Social Worker II [phone]

## 2023-02-27 LAB — GLUCOSE, CAPILLARY: Glucose-Capillary: 92 mg/dL (ref 70–99)

## 2023-02-28 ENCOUNTER — Other Ambulatory Visit: Payer: Self-pay | Admitting: Family Medicine

## 2023-02-28 ENCOUNTER — Telehealth: Payer: Self-pay

## 2023-02-28 NOTE — Transitions of Care (Post Inpatient/ED Visit) (Unsigned)
   02/28/2023  Name: [name] MRN: [id number] DOB: [birth year]  Today's TOC FU Call Status: Today's TOC FU Call Status:: Unsuccessul Call (1st Attempt) Unsuccessful Call (1st Attempt) Date: 02/28/23  Attempted to reach the patient regarding the most recent Inpatient/ED visit.  Follow Up Plan: Additional outreach attempts will be made to reach the patient to complete the Transitions of Care (Post Inpatient/ED visit) call.   Signature Juanda Crumble, [place] Direct Dial [phone]

## 2023-03-01 NOTE — Transitions of Care (Post Inpatient/ED Visit) (Unsigned)
   03/01/2023  Name: Anushri Relph MRN: [id number] DOB: 1976/10/11  Today's TOC FU Call Status: Today's TOC FU Call Status:: Unsuccessful Call (2nd Attempt) Unsuccessful Call (1st Attempt) Date: 02/28/23 Unsuccessful Call (2nd Attempt) Date: 03/01/23  Attempted to reach the patient regarding the most recent Inpatient/ED visit.  Follow Up Plan: Additional outreach attempts will be made to reach the patient to complete the Transitions of Care (Post Inpatient/ED visit) call.   Signature Juanda Crumble, [place] Direct Dial [phone]

## 2023-03-02 ENCOUNTER — Other Ambulatory Visit: Payer: Self-pay | Admitting: Family Medicine

## 2023-03-02 NOTE — Transitions of Care (Post Inpatient/ED Visit) (Signed)
   03/02/2023  Name: [name] MRN: [id number] DOB: 05-02-1976  Today's TOC FU Call Status: Today's TOC FU Call Status:: Unsuccessful Call (3rd Attempt) Unsuccessful Call (1st Attempt) Date: 02/28/23 Unsuccessful Call (2nd Attempt) Date: 03/01/23 Unsuccessful Call (3rd Attempt) Date: 03/02/23  Attempted to reach the patient regarding the most recent Inpatient/ED visit.  Follow Up Plan: No further outreach attempts will be made at this time. We have been unable to contact the patient.  Signature Juanda Crumble, [place] Direct Dial [phone]

## 2023-03-04 ENCOUNTER — Telehealth: Payer: Self-pay

## 2023-03-04 ENCOUNTER — Other Ambulatory Visit (HOSPITAL_COMMUNITY): Payer: Self-pay

## 2023-03-04 NOTE — Telephone Encounter (Signed)
A Prior Authorization was initiated for this patients LIDOCAINE 5% PATCHES through CoverMyMeds.   Key: [id number]

## 2023-03-07 ENCOUNTER — Encounter: Payer: Self-pay | Admitting: Family Medicine

## 2023-03-07 ENCOUNTER — Ambulatory Visit (INDEPENDENT_AMBULATORY_CARE_PROVIDER_SITE_OTHER): Payer: Medicaid Other | Admitting: Family Medicine

## 2023-03-07 VITALS — BP 122/81 | HR 94

## 2023-03-07 DIAGNOSIS — M86172 Other acute osteomyelitis, left ankle and foot: Secondary | ICD-10-CM | POA: Diagnosis not present

## 2023-03-07 DIAGNOSIS — Z794 Long term (current) use of insulin: Secondary | ICD-10-CM

## 2023-03-07 DIAGNOSIS — J9611 Chronic respiratory failure with hypoxia: Secondary | ICD-10-CM | POA: Diagnosis not present

## 2023-03-07 DIAGNOSIS — N1831 Chronic kidney disease, stage 3a: Secondary | ICD-10-CM

## 2023-03-07 DIAGNOSIS — J45909 Unspecified asthma, uncomplicated: Secondary | ICD-10-CM | POA: Diagnosis not present

## 2023-03-07 DIAGNOSIS — E1140 Type 2 diabetes mellitus with diabetic neuropathy, unspecified: Secondary | ICD-10-CM | POA: Diagnosis not present

## 2023-03-07 DIAGNOSIS — I5032 Chronic diastolic (congestive) heart failure: Secondary | ICD-10-CM | POA: Diagnosis not present

## 2023-03-07 DIAGNOSIS — E669 Obesity, unspecified: Secondary | ICD-10-CM | POA: Diagnosis not present

## 2023-03-07 DIAGNOSIS — G4733 Obstructive sleep apnea (adult) (pediatric): Secondary | ICD-10-CM | POA: Diagnosis not present

## 2023-03-07 DIAGNOSIS — G8929 Other chronic pain: Secondary | ICD-10-CM

## 2023-03-07 MED ORDER — GABAPENTIN 600 MG PO TABS: ORAL_TABLET | ORAL | 4 refills | Status: DC

## 2023-03-07 MED ORDER — NYSTATIN 100000 UNIT/GM EX POWD: 1.0000 | Freq: Three times a day (TID) | CUTANEOUS | 0 refills | Status: DC

## 2023-03-07 NOTE — Patient Instructions (Addendum)
It was great to see you again today.  Reordered gabapentin. Take one pill three times daily, can gradually increase to 2 pills three times daily if you tolerate it okay. Stay off lyrica.  Checking kidney function today  Refilled nystatin  Reordered home oxygen  Be well, Dr. [name]

## 2023-03-07 NOTE — Progress Notes (Unsigned)
  Date of Visit: 03/07/2023   SUBJECTIVE:   HPI:  [name] presents today for routine follow-up and hospital follow-up.  Hospital follow-up: Was recently admitted to the hospital from March 17 through March 23 with shortness of breath deemed to likely be a HFpEF exacerbation.  She was diuresed and required increased amounts of oxygen while in the hospital.  She also retaining CO2 and required monitoring in the ICU.  Since discharge from the hospital she has been using 3 L of oxygen at all times.  Is doing well with this.  Is also now on a higher dose of torsemide 100 mg twice a day.  Tolerating this well.  Reports good urine output with it.  Diabetic neuropathy.  While in the hospital her gabapentin 1200 mg 3 times daily was stopped and she was transitioned to Lyrica 100 mg 3 times daily.  She reports this is not helping at all and she has stopped taking the Lyrica.  She would like to go back to gabapentin.  Of note, she is planning to move to [place] at the end of May.  She has friends there she plans to live with.  This may be my last visit with her.  Her teenage child, [name] is likely staying here.  Requests refill of nystatin to use under her abdominal crease.  OBJECTIVE:   BP 122/81   Pulse 94   SpO2 96% Comment: with oxygen Gen: No acute distress, pleasant, cooperative HEENT: Normocephalic, atraumatic, nasal cannula in place Heart: Regular rate and rhythm, no murmur Lungs: Clear to auscultation bilaterally, normal effort Neuro: Grossly nonfocal, speech normal Ext: Minimal right lower extremity edema.  Status post right transmetatarsal amputation.  Status post left BKA.  ASSESSMENT/PLAN:   Encounter for chronic pain management Ongoing diabetic neuropathy.  Not improved at all with Lyrica.  We will transition back to gabapentin.  Given instructions for how to start lower and titrate back up to her 1200 mg twice a day dose.  She is agreeable to this plan.  Chronic respiratory  failure with hypoxia ([name]) O2 sat today 86% at rest without oxygen 96% on 3 L at rest. Entered new order for oxygen DME.  (HFpEF) heart failure with preserved ejection fraction Volume status acceptable today.  Continue torsemide 100 mg twice a day.  Check BMET today.  Refill nystatin for likely yeast intertrigo  [name], [place]

## 2023-03-08 LAB — BASIC METABOLIC PANEL
BUN/Creatinine Ratio: 35 — ABNORMAL HIGH (ref 9–23)
BUN: 47 mg/dL — ABNORMAL HIGH (ref 6–24)
CO2: 32 mmol/L — ABNORMAL HIGH (ref 20–29)
Calcium: 9.1 mg/dL (ref 8.7–10.2)
Chloride: 90 mmol/L — ABNORMAL LOW (ref 96–106)
Creatinine, Ser: 1.33 mg/dL — ABNORMAL HIGH (ref 0.57–1.00)
Glucose: 444 mg/dL — ABNORMAL HIGH (ref 70–99)
Potassium: 4.2 mmol/L (ref 3.5–5.2)
Sodium: 137 mmol/L (ref 134–144)
eGFR: 50 mL/min/{1.73_m2} — ABNORMAL LOW (ref >59)

## 2023-03-09 NOTE — Assessment & Plan Note (Signed)
Ongoing diabetic neuropathy.  Not improved at all with Lyrica.  We will transition back to gabapentin.  Given instructions for how to start lower and titrate back up to her 1200 mg twice a day dose.  She is agreeable to this plan.

## 2023-03-09 NOTE — Assessment & Plan Note (Signed)
Volume status acceptable today.  Continue torsemide 100 mg twice a day.  Check BMET today.

## 2023-03-09 NOTE — Assessment & Plan Note (Signed)
O2 sat today 86% at rest without oxygen 96% on 3 L at rest. Entered new order for oxygen DME.

## 2023-03-10 NOTE — Telephone Encounter (Addendum)
Prior Auth for patients medication LIDOCAINE 5% PATCHES denied by [facility] via CoverMyMeds.   Reason:   LIDOCAINE 4% PATCHES ARE AVAILABLE OTC  CoverMyMeds Key: [id number]

## 2023-03-11 ENCOUNTER — Telehealth: Payer: Self-pay | Admitting: Internal Medicine

## 2023-03-11 NOTE — Telephone Encounter (Signed)
Pls call PT @ [phone]  [facility] say they can not RX  POC because she is on "Vent"   They say she is not eligable because she is on Trellegy.   She feels she is because she uses a Bipap

## 2023-03-14 ENCOUNTER — Encounter: Payer: Self-pay | Admitting: Family Medicine

## 2023-03-14 NOTE — Telephone Encounter (Signed)
Spoke to pt and she stated she spoke to an [employer] rep that informed her that they will be calling her to set up her 6 min walk test. She also stated she needed a new neb machine. Which she told me she has the old one but it's in storage and she can not get to it right now. A rep at [facility] informed her she could get a new one after March because the order for a new neb was placed in October of 2023. Nothing further is needed at this time.

## 2023-03-15 DIAGNOSIS — R32 Unspecified urinary incontinence: Secondary | ICD-10-CM | POA: Diagnosis not present

## 2023-03-15 DIAGNOSIS — E119 Type 2 diabetes mellitus without complications: Secondary | ICD-10-CM | POA: Diagnosis not present

## 2023-03-15 DIAGNOSIS — Z89512 Acquired absence of left leg below knee: Secondary | ICD-10-CM | POA: Diagnosis not present

## 2023-03-17 IMAGING — DX DG CHEST 1V PORT
1 series · 1 of 1 positions shown · non-contrast
Comparison: 02/05/2022.

CLINICAL DATA: PICC line placement.

EXAM:
PORTABLE CHEST 1 VIEW

[chest ap]
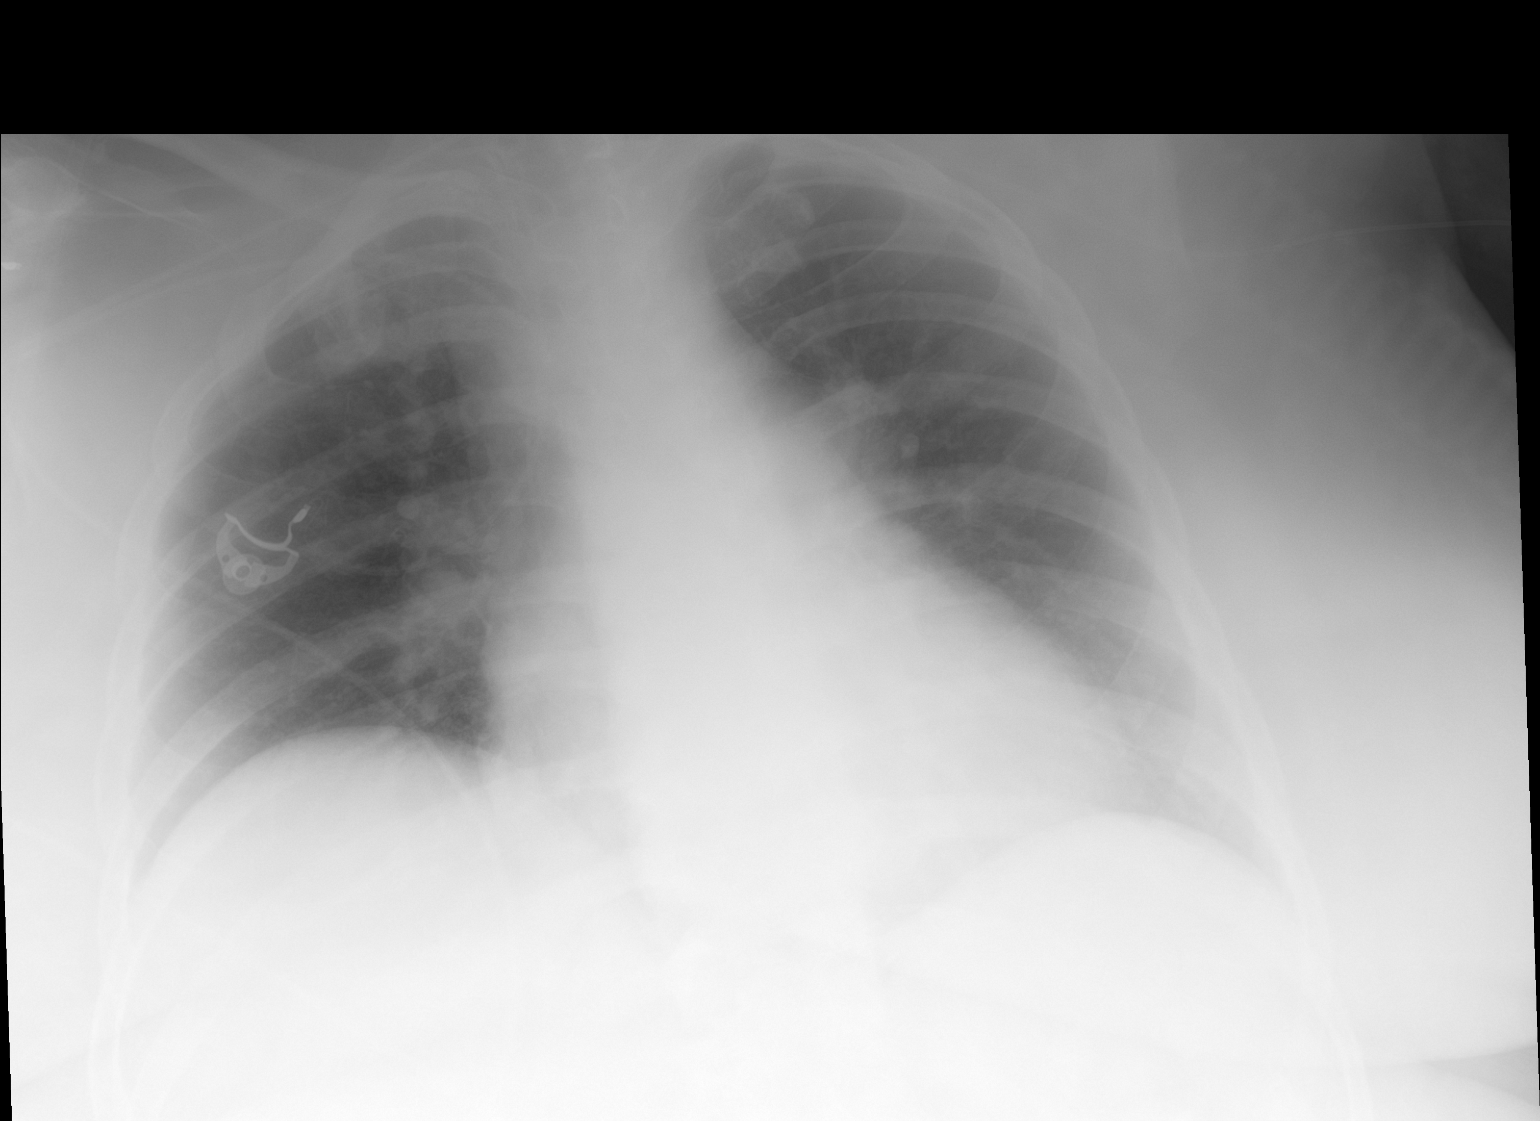

[1 of 1 positions shown; findings below may reference images not displayed]

FINDINGS: Examination is limited due to motion artifact and patient's body
habitus. The heart is enlarged the mediastinal contour is stable.
Lung volumes are low. The pulmonary vasculature is distended. No
consolidation, effusion, or pneumothorax. The distal tip of the
right PICC line terminates over the superior vena cava.
IMPRESSION: 1. Right-sided PICC line terminates over the superior vena cava.
2. Cardiomegaly with mild pulmonary vascular congestion.

## 2023-03-17 IMAGING — DX DG CHEST 1V PORT
2 series · 2 of 2 positions shown · non-contrast
Comparison: Chest radiograph dated 10/16/2021.

CLINICAL DATA: Shortness of breath.

EXAM:
PORTABLE CHEST 1 VIEW

[chest ap]
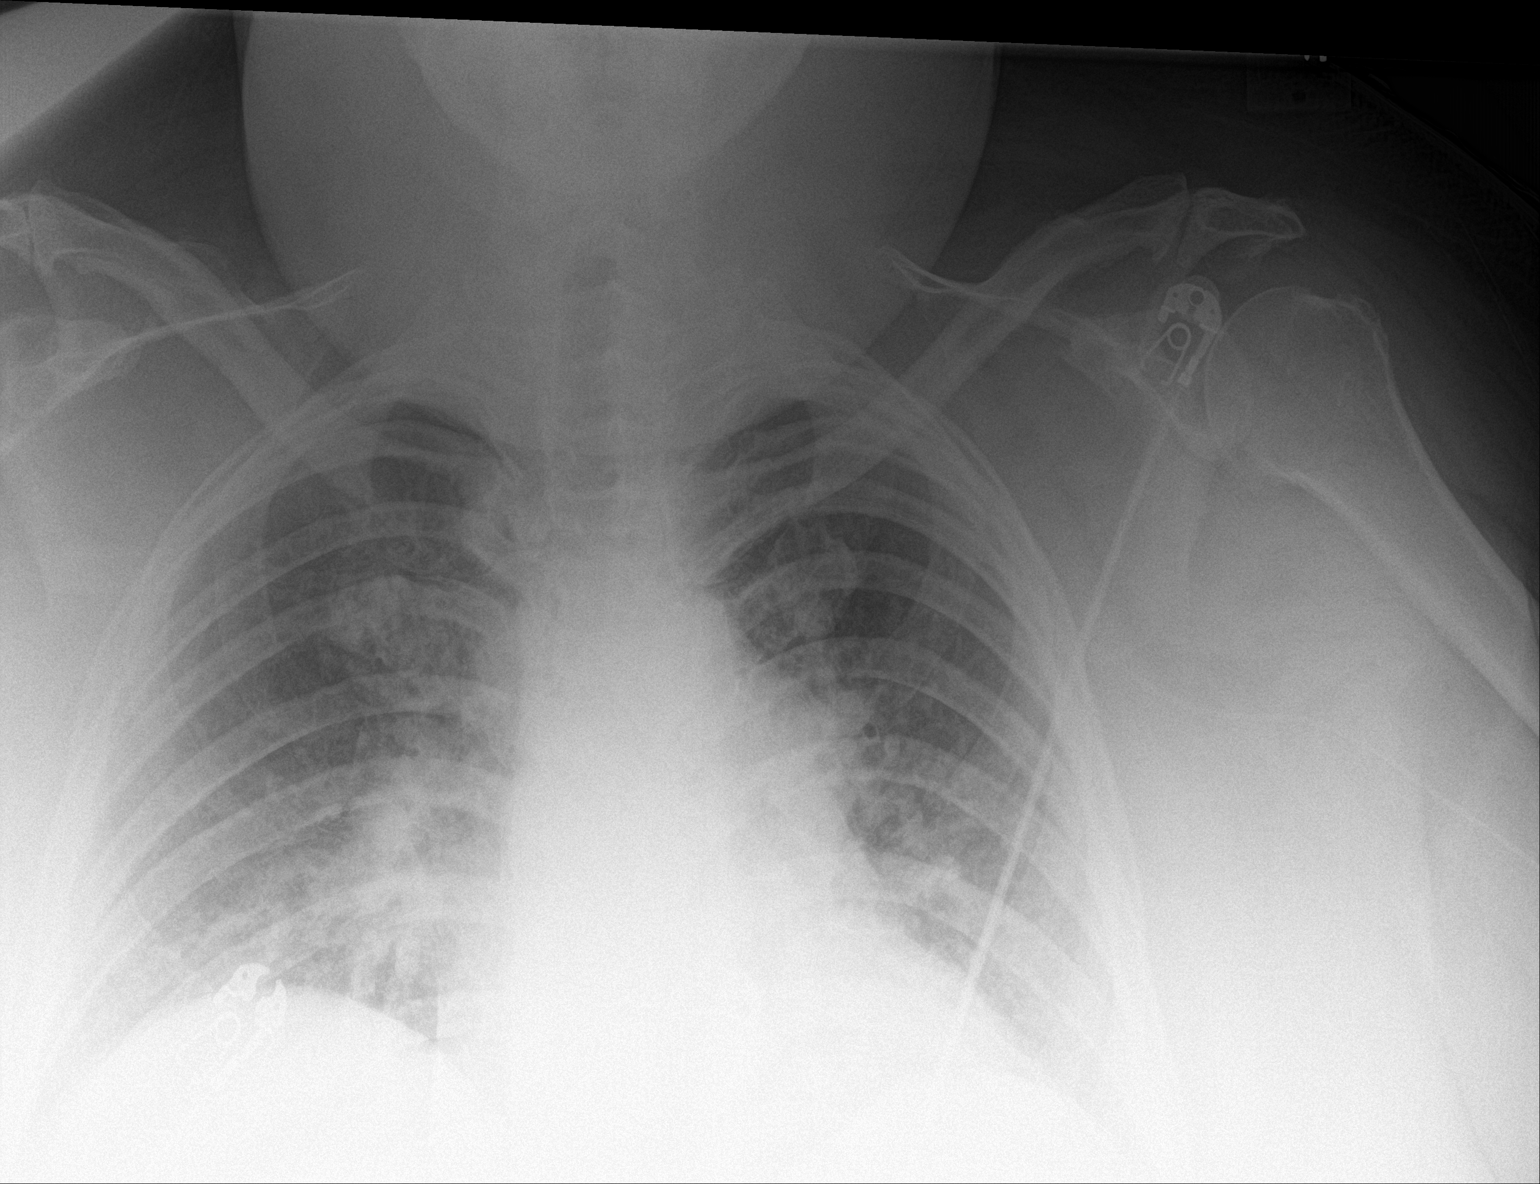

[chest lat]
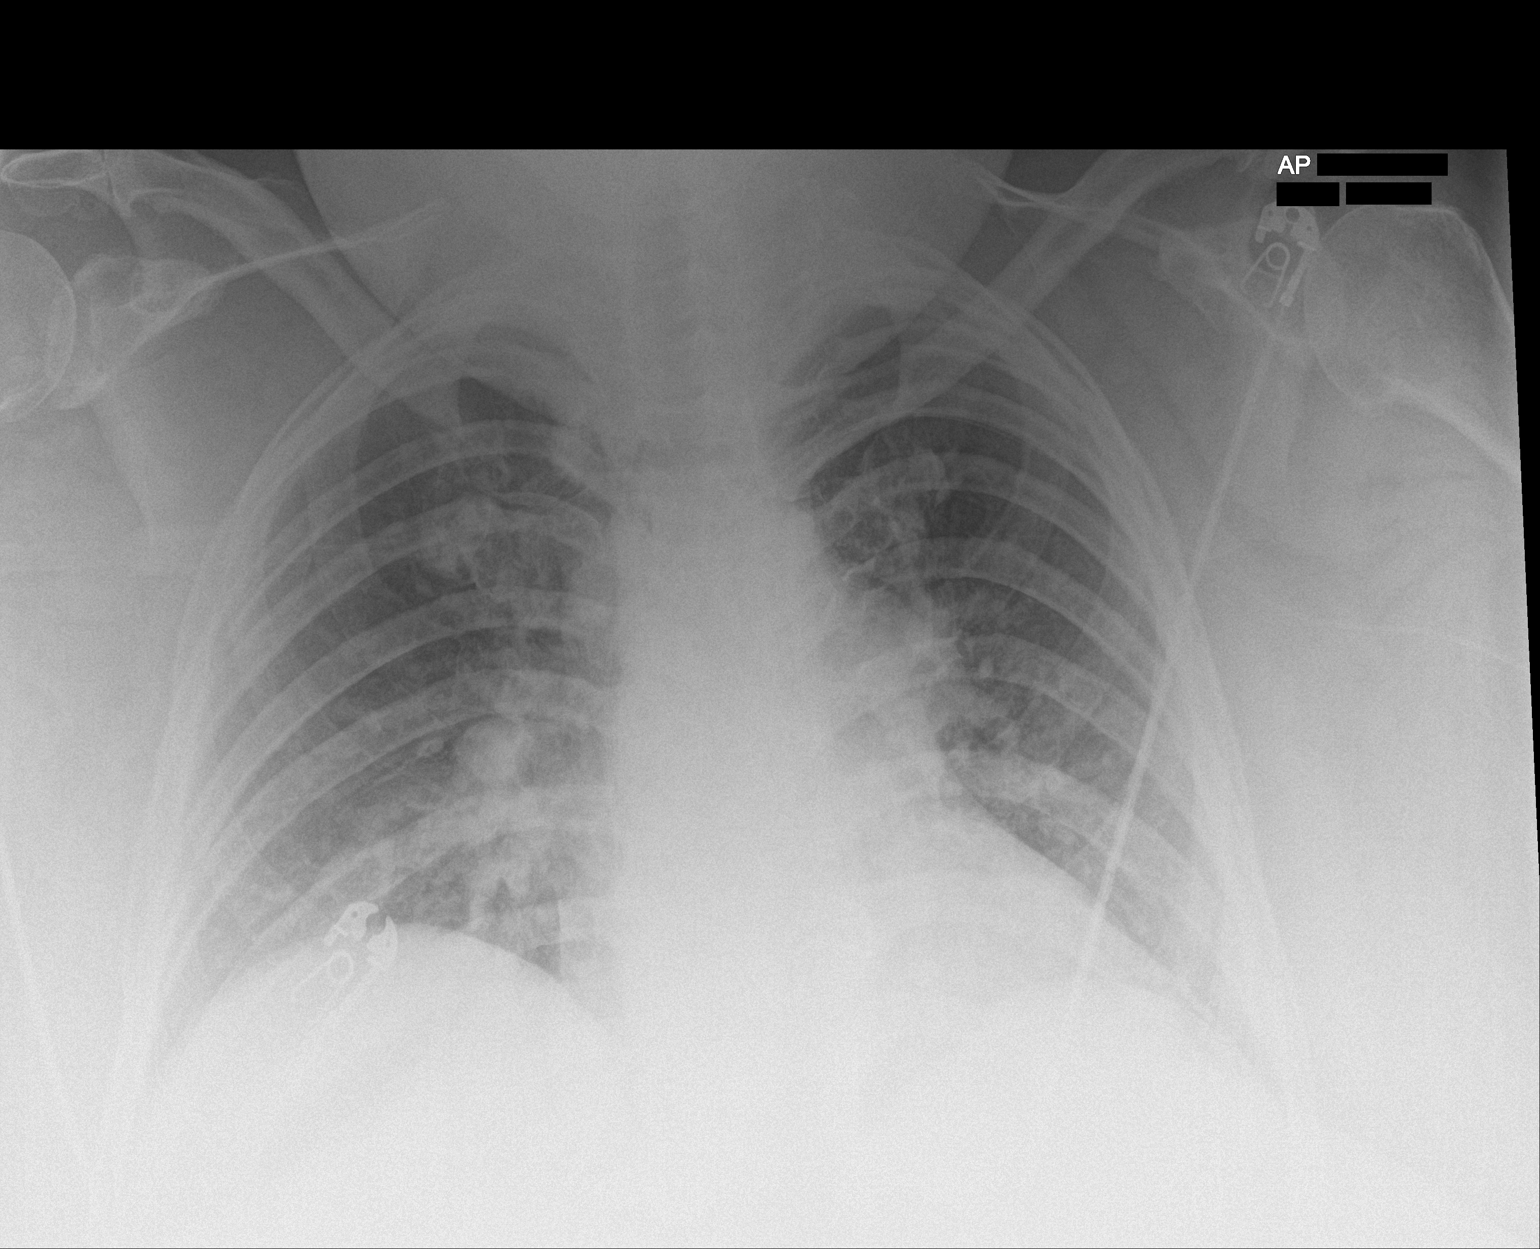

[2 of 2 positions shown; findings below may reference images not displayed]

FINDINGS: Mild cardiomegaly with mild central vascular congestion. No focal
consolidation, pleural effusion, pneumothorax. No acute osseous
pathology.
IMPRESSION: Mild cardiomegaly with mild central vascular congestion. No focal
consolidation.

## 2023-03-18 ENCOUNTER — Other Ambulatory Visit: Payer: Self-pay

## 2023-03-19 ENCOUNTER — Observation Stay (HOSPITAL_COMMUNITY): Payer: Medicaid Other

## 2023-03-19 ENCOUNTER — Emergency Department (HOSPITAL_COMMUNITY): Payer: Medicaid Other

## 2023-03-19 ENCOUNTER — Inpatient Hospital Stay (HOSPITAL_COMMUNITY)
Admission: EM | Admit: 2023-03-19 | Discharge: 2023-03-22 | DRG: 189 | Disposition: A | Discharge status: Home / Self Care | Payer: Medicaid Other | Attending: Family Medicine | Admitting: Family Medicine

## 2023-03-19 ENCOUNTER — Other Ambulatory Visit: Payer: Self-pay

## 2023-03-19 DIAGNOSIS — Z7409 Other reduced mobility: Secondary | ICD-10-CM

## 2023-03-19 DIAGNOSIS — I110 Hypertensive heart disease with heart failure: Secondary | ICD-10-CM | POA: Diagnosis present

## 2023-03-19 DIAGNOSIS — Z89512 Acquired absence of left leg below knee: Secondary | ICD-10-CM

## 2023-03-19 DIAGNOSIS — I5033 Acute on chronic diastolic (congestive) heart failure: Secondary | ICD-10-CM

## 2023-03-19 DIAGNOSIS — J4489 Other specified chronic obstructive pulmonary disease: Secondary | ICD-10-CM | POA: Diagnosis present

## 2023-03-19 DIAGNOSIS — E1140 Type 2 diabetes mellitus with diabetic neuropathy, unspecified: Secondary | ICD-10-CM | POA: Diagnosis present

## 2023-03-19 DIAGNOSIS — Z885 Allergy status to narcotic agent status: Secondary | ICD-10-CM

## 2023-03-19 DIAGNOSIS — Z794 Long term (current) use of insulin: Secondary | ICD-10-CM

## 2023-03-19 DIAGNOSIS — R0602 Shortness of breath: Secondary | ICD-10-CM | POA: Diagnosis not present

## 2023-03-19 DIAGNOSIS — N1330 Unspecified hydronephrosis: Secondary | ICD-10-CM | POA: Diagnosis present

## 2023-03-19 DIAGNOSIS — J9602 Acute respiratory failure with hypercapnia: Secondary | ICD-10-CM

## 2023-03-19 DIAGNOSIS — L304 Erythema intertrigo: Secondary | ICD-10-CM

## 2023-03-19 DIAGNOSIS — I5032 Chronic diastolic (congestive) heart failure: Secondary | ICD-10-CM | POA: Diagnosis present

## 2023-03-19 DIAGNOSIS — Z7951 Long term (current) use of inhaled steroids: Secondary | ICD-10-CM

## 2023-03-19 DIAGNOSIS — G2581 Restless legs syndrome: Secondary | ICD-10-CM | POA: Diagnosis present

## 2023-03-19 DIAGNOSIS — R0789 Other chest pain: Secondary | ICD-10-CM | POA: Diagnosis present

## 2023-03-19 DIAGNOSIS — Z7982 Long term (current) use of aspirin: Secondary | ICD-10-CM

## 2023-03-19 DIAGNOSIS — G4733 Obstructive sleep apnea (adult) (pediatric): Secondary | ICD-10-CM | POA: Diagnosis present

## 2023-03-19 DIAGNOSIS — M1711 Unilateral primary osteoarthritis, right knee: Secondary | ICD-10-CM

## 2023-03-19 DIAGNOSIS — Z5901 Sheltered homelessness: Secondary | ICD-10-CM

## 2023-03-19 DIAGNOSIS — Z91018 Allergy to other foods: Secondary | ICD-10-CM

## 2023-03-19 DIAGNOSIS — N179 Acute kidney failure, unspecified: Secondary | ICD-10-CM | POA: Diagnosis not present

## 2023-03-19 DIAGNOSIS — Z87891 Personal history of nicotine dependence: Secondary | ICD-10-CM

## 2023-03-19 DIAGNOSIS — E785 Hyperlipidemia, unspecified: Secondary | ICD-10-CM | POA: Diagnosis present

## 2023-03-19 DIAGNOSIS — E1165 Type 2 diabetes mellitus with hyperglycemia: Secondary | ICD-10-CM | POA: Diagnosis not present

## 2023-03-19 DIAGNOSIS — Z87892 Personal history of anaphylaxis: Secondary | ICD-10-CM

## 2023-03-19 DIAGNOSIS — Z89431 Acquired absence of right foot: Secondary | ICD-10-CM

## 2023-03-19 DIAGNOSIS — Z888 Allergy status to other drugs, medicaments and biological substances status: Secondary | ICD-10-CM

## 2023-03-19 DIAGNOSIS — J9622 Acute and chronic respiratory failure with hypercapnia: Principal | ICD-10-CM | POA: Diagnosis present

## 2023-03-19 DIAGNOSIS — Z6841 Body Mass Index (BMI) 40.0 and over, adult: Secondary | ICD-10-CM

## 2023-03-19 DIAGNOSIS — E8770 Fluid overload, unspecified: Secondary | ICD-10-CM | POA: Diagnosis present

## 2023-03-19 DIAGNOSIS — J9611 Chronic respiratory failure with hypoxia: Secondary | ICD-10-CM | POA: Diagnosis present

## 2023-03-19 DIAGNOSIS — G8929 Other chronic pain: Secondary | ICD-10-CM | POA: Diagnosis present

## 2023-03-19 DIAGNOSIS — I10 Essential (primary) hypertension: Secondary | ICD-10-CM | POA: Diagnosis not present

## 2023-03-19 DIAGNOSIS — K219 Gastro-esophageal reflux disease without esophagitis: Secondary | ICD-10-CM | POA: Diagnosis present

## 2023-03-19 DIAGNOSIS — I2510 Atherosclerotic heart disease of native coronary artery without angina pectoris: Secondary | ICD-10-CM | POA: Diagnosis present

## 2023-03-19 DIAGNOSIS — Z881 Allergy status to other antibiotic agents status: Secondary | ICD-10-CM

## 2023-03-19 DIAGNOSIS — F1490 Cocaine use, unspecified, uncomplicated: Secondary | ICD-10-CM | POA: Diagnosis present

## 2023-03-19 LAB — CBC WITH DIFFERENTIAL/PLATELET
Abs Immature Granulocytes: 0.11 10*3/uL — ABNORMAL HIGH (ref 0.00–0.07)
Basophils Absolute: 0.1 10*3/uL (ref 0.0–0.1)
Basophils Relative: 1 %
Eosinophils Absolute: 0.3 10*3/uL (ref 0.0–0.5)
Eosinophils Relative: 3 %
HCT: 40.2 % (ref 36.0–46.0)
Hemoglobin: 12.3 g/dL (ref 12.0–15.0)
Immature Granulocytes: 1 %
Lymphocytes Relative: 35 %
Lymphs Abs: 3.3 10*3/uL (ref 0.7–4.0)
MCH: 27.3 pg (ref 26.0–34.0)
MCHC: 30.6 g/dL (ref 30.0–36.0)
MCV: 89.1 fL (ref 80.0–100.0)
Monocytes Absolute: 0.9 10*3/uL (ref 0.1–1.0)
Monocytes Relative: 9 %
Neutro Abs: 5 10*3/uL (ref 1.7–7.7)
Neutrophils Relative %: 51 %
Platelets: 320 10*3/uL (ref 150–400)
RBC: 4.51 MIL/uL (ref 3.87–5.11)
RDW: 15.8 % — ABNORMAL HIGH (ref 11.5–15.5)
WBC: 9.6 10*3/uL (ref 4.0–10.5)
nRBC: 0 % (ref 0.0–0.2)

## 2023-03-19 LAB — I-STAT VENOUS BLOOD GAS, ED
Acid-Base Excess: 6 mmol/L — ABNORMAL HIGH (ref 0.0–2.0)
Bicarbonate: 34.7 mmol/L — ABNORMAL HIGH (ref 20.0–28.0)
Calcium, Ion: 1.18 mmol/L (ref 1.15–1.40)
HCT: 39 % (ref 36.0–46.0)
Hemoglobin: 13.3 g/dL (ref 12.0–15.0)
O2 Saturation: 68 %
Potassium: 3.8 mmol/L (ref 3.5–5.1)
Sodium: 133 mmol/L — ABNORMAL LOW (ref 135–145)
TCO2: 37 mmol/L — ABNORMAL HIGH (ref 22–32)
pCO2, Ven: 72.2 mmHg (ref 44–60)
pH, Ven: 7.289 (ref 7.25–7.43)
pO2, Ven: 41 mmHg (ref 32–45)

## 2023-03-19 LAB — RAPID URINE DRUG SCREEN, HOSP PERFORMED
Amphetamines: NOT DETECTED
Barbiturates: NOT DETECTED
Benzodiazepines: NOT DETECTED
Cocaine: POSITIVE — AB
Opiates: NOT DETECTED
Tetrahydrocannabinol: NOT DETECTED

## 2023-03-19 LAB — I-STAT CHEM 8, ED
BUN: 91 mg/dL — ABNORMAL HIGH (ref 6–20)
Calcium, Ion: 1.17 mmol/L (ref 1.15–1.40)
Chloride: 91 mmol/L — ABNORMAL LOW (ref 98–111)
Creatinine, Ser: 1.8 mg/dL — ABNORMAL HIGH (ref 0.44–1.00)
Glucose, Bld: 301 mg/dL — ABNORMAL HIGH (ref 70–99)
HCT: 42 % (ref 36.0–46.0)
Hemoglobin: 14.3 g/dL (ref 12.0–15.0)
Potassium: 4.1 mmol/L (ref 3.5–5.1)
Sodium: 134 mmol/L — ABNORMAL LOW (ref 135–145)
TCO2: 35 mmol/L — ABNORMAL HIGH (ref 22–32)

## 2023-03-19 LAB — COMPREHENSIVE METABOLIC PANEL
ALT: 19 U/L (ref 0–44)
AST: 13 U/L — ABNORMAL LOW (ref 15–41)
Albumin: 3 g/dL — ABNORMAL LOW (ref 3.5–5.0)
Alkaline Phosphatase: 117 U/L (ref 38–126)
Anion gap: 12 (ref 5–15)
BUN: 81 mg/dL — ABNORMAL HIGH (ref 6–20)
CO2: 33 mmol/L — ABNORMAL HIGH (ref 22–32)
Calcium: 9.1 mg/dL (ref 8.9–10.3)
Chloride: 88 mmol/L — ABNORMAL LOW (ref 98–111)
Creatinine, Ser: 1.74 mg/dL — ABNORMAL HIGH (ref 0.44–1.00)
GFR, Estimated: 36 mL/min — ABNORMAL LOW (ref >60)
Glucose, Bld: 297 mg/dL — ABNORMAL HIGH (ref 70–99)
Potassium: 4 mmol/L (ref 3.5–5.1)
Sodium: 133 mmol/L — ABNORMAL LOW (ref 135–145)
Total Bilirubin: 0.4 mg/dL (ref 0.3–1.2)
Total Protein: 8.1 g/dL (ref 6.5–8.1)

## 2023-03-19 LAB — PROTIME-INR
INR: 1 (ref 0.8–1.2)
Prothrombin Time: 13.4 seconds (ref 11.4–15.2)

## 2023-03-19 LAB — CBG MONITORING, ED: Glucose-Capillary: 218 mg/dL — ABNORMAL HIGH (ref 70–99)

## 2023-03-19 LAB — GLUCOSE, CAPILLARY
Glucose-Capillary: 211 mg/dL — ABNORMAL HIGH (ref 70–99)
Glucose-Capillary: 264 mg/dL — ABNORMAL HIGH (ref 70–99)

## 2023-03-19 LAB — D-DIMER, QUANTITATIVE: D-Dimer, Quant: 0.52 ug/mL-FEU — ABNORMAL HIGH (ref 0.00–0.50)

## 2023-03-19 LAB — TROPONIN I (HIGH SENSITIVITY)
Troponin I (High Sensitivity): 27 ng/L — ABNORMAL HIGH (ref <18)
Troponin I (High Sensitivity): 22 ng/L — ABNORMAL HIGH (ref <18)

## 2023-03-19 LAB — BRAIN NATRIURETIC PEPTIDE: B Natriuretic Peptide: 16 pg/mL (ref 0.0–100.0)

## 2023-03-19 MED ORDER — ROSUVASTATIN CALCIUM 20 MG PO TABS
40.0000 mg | ORAL_TABLET | Freq: Every day | ORAL | Status: DC
  Administered 2023-03-20 – 2023-03-22 (×3): 40 mg via ORAL
  Filled 2023-03-19 (×3): qty 2

## 2023-03-19 MED ORDER — INSULIN GLARGINE-YFGN 100 UNIT/ML ~~LOC~~ SOLN
30.0000 [IU] | Freq: Two times a day (BID) | SUBCUTANEOUS | Status: DC
  Administered 2023-03-19 – 2023-03-22 (×7): 30 [IU] via SUBCUTANEOUS
  Filled 2023-03-19 (×8): qty 0.3

## 2023-03-19 MED ORDER — DICLOFENAC SODIUM 1 % EX GEL
4.0000 g | Freq: Four times a day (QID) | CUTANEOUS | Status: DC
  Administered 2023-03-21: 4 g via TOPICAL

## 2023-03-19 MED ORDER — LATANOPROST 0.005 % OP SOLN
1.0000 [drp] | Freq: Every day | OPHTHALMIC | Status: DC
  Administered 2023-03-20 – 2023-03-22 (×2): 1 [drp] via OPHTHALMIC
  Filled 2023-03-19: qty 2.5

## 2023-03-19 MED ORDER — AMLODIPINE BESYLATE 10 MG PO TABS
10.0000 mg | ORAL_TABLET | Freq: Every day | ORAL | Status: DC
  Administered 2023-03-20 – 2023-03-22 (×3): 10 mg via ORAL
  Filled 2023-03-19 (×3): qty 1

## 2023-03-19 MED ORDER — TORSEMIDE 20 MG PO TABS: 100.0000 mg | ORAL_TABLET | Freq: Two times a day (BID) | ORAL | Status: DC

## 2023-03-19 MED ORDER — INSULIN ASPART 100 UNIT/ML FLEXPEN: 30.0000 [IU] | PEN_INJECTOR | Freq: Three times a day (TID) | SUBCUTANEOUS | Status: DC

## 2023-03-19 MED ORDER — FUROSEMIDE 10 MG/ML IJ SOLN
80.0000 mg | Freq: Once | INTRAMUSCULAR | Status: AC
  Administered 2023-03-19: 80 mg via INTRAVENOUS
  Filled 2023-03-19: qty 8

## 2023-03-19 MED ORDER — ACETAMINOPHEN 325 MG PO TABS: 325.0000 mg | ORAL_TABLET | ORAL | Status: DC | PRN

## 2023-03-19 MED ORDER — ONDANSETRON HCL 4 MG PO TABS
4.0000 mg | ORAL_TABLET | Freq: Three times a day (TID) | ORAL | Status: DC | PRN
  Administered 2023-03-22: 4 mg via ORAL
  Filled 2023-03-19: qty 1

## 2023-03-19 MED ORDER — BRIMONIDINE TARTRATE 0.2 % OP SOLN
1.0000 [drp] | Freq: Three times a day (TID) | OPHTHALMIC | Status: DC
  Administered 2023-03-19 – 2023-03-22 (×8): 1 [drp] via OPHTHALMIC
  Filled 2023-03-19: qty 5

## 2023-03-19 MED ORDER — ACETAMINOPHEN 500 MG PO TABS
1000.0000 mg | ORAL_TABLET | Freq: Four times a day (QID) | ORAL | Status: DC | PRN
  Administered 2023-03-19 – 2023-03-21 (×3): 1000 mg via ORAL
  Filled 2023-03-19 (×3): qty 2

## 2023-03-19 MED ORDER — IPRATROPIUM-ALBUTEROL 0.5-2.5 (3) MG/3ML IN SOLN
3.0000 mL | Freq: Four times a day (QID) | RESPIRATORY_TRACT | Status: DC | PRN
  Administered 2023-03-19: 3 mL via RESPIRATORY_TRACT
  Filled 2023-03-19: qty 3

## 2023-03-19 MED ORDER — MOMETASONE FURO-FORMOTEROL FUM 200-5 MCG/ACT IN AERO
2.0000 | INHALATION_SPRAY | Freq: Two times a day (BID) | RESPIRATORY_TRACT | Status: DC
  Administered 2023-03-19 – 2023-03-22 (×5): 2 via RESPIRATORY_TRACT
  Filled 2023-03-19: qty 8.8

## 2023-03-19 MED ORDER — NITROGLYCERIN 0.4 MG SL SUBL
0.4000 mg | SUBLINGUAL_TABLET | SUBLINGUAL | Status: DC | PRN
  Administered 2023-03-21 (×3): 0.4 mg via SUBLINGUAL
  Filled 2023-03-19: qty 1

## 2023-03-19 MED ORDER — DULOXETINE HCL 30 MG PO CPEP
120.0000 mg | ORAL_CAPSULE | Freq: Every day | ORAL | Status: DC
  Administered 2023-03-20 – 2023-03-22 (×3): 120 mg via ORAL
  Filled 2023-03-19 (×3): qty 4

## 2023-03-19 MED ORDER — ACETAMINOPHEN 10 MG/ML IV SOLN
1000.0000 mg | Freq: Four times a day (QID) | INTRAVENOUS | Status: DC
  Administered 2023-03-19: 1000 mg via INTRAVENOUS
  Filled 2023-03-19 (×2): qty 100

## 2023-03-19 MED ORDER — INSULIN ASPART 100 UNIT/ML IJ SOLN
0.0000 [IU] | Freq: Three times a day (TID) | INTRAMUSCULAR | Status: DC
  Administered 2023-03-19 (×2): 7 [IU] via SUBCUTANEOUS
  Administered 2023-03-20: 20 [IU] via SUBCUTANEOUS
  Administered 2023-03-20: 15 [IU] via SUBCUTANEOUS
  Administered 2023-03-20: 20 [IU] via SUBCUTANEOUS
  Administered 2023-03-21: 7 [IU] via SUBCUTANEOUS
  Administered 2023-03-21: 11 [IU] via SUBCUTANEOUS
  Administered 2023-03-21: 15 [IU] via SUBCUTANEOUS
  Administered 2023-03-22: 7 [IU] via SUBCUTANEOUS

## 2023-03-19 MED ORDER — ADULT MULTIVITAMIN W/MINERALS CH
1.0000 | ORAL_TABLET | Freq: Every day | ORAL | Status: DC
  Administered 2023-03-20 – 2023-03-22 (×3): 1 via ORAL
  Filled 2023-03-19 (×3): qty 1

## 2023-03-19 MED ORDER — ALBUTEROL SULFATE (2.5 MG/3ML) 0.083% IN NEBU: 2.5000 mg | INHALATION_SOLUTION | Freq: Four times a day (QID) | RESPIRATORY_TRACT | Status: DC | PRN

## 2023-03-19 MED ORDER — LIDOCAINE 5 % EX PTCH
1.0000 | MEDICATED_PATCH | CUTANEOUS | Status: DC
  Administered 2023-03-19 – 2023-03-22 (×4): 1 via TRANSDERMAL
  Filled 2023-03-19 (×4): qty 1

## 2023-03-19 MED ORDER — INSULIN DEGLUDEC 200 UNIT/ML ~~LOC~~ SOPN: PEN_INJECTOR | Freq: Two times a day (BID) | SUBCUTANEOUS | Status: DC

## 2023-03-19 MED ORDER — BRINZOLAMIDE 1 % OP SUSP
1.0000 [drp] | Freq: Three times a day (TID) | OPHTHALMIC | Status: DC
  Administered 2023-03-19 – 2023-03-22 (×8): 1 [drp] via OPHTHALMIC
  Filled 2023-03-19: qty 10

## 2023-03-19 MED ORDER — ASPIRIN 81 MG PO TBEC
81.0000 mg | DELAYED_RELEASE_TABLET | Freq: Every day | ORAL | Status: DC
  Administered 2023-03-20 – 2023-03-22 (×3): 81 mg via ORAL
  Filled 2023-03-19 (×3): qty 1

## 2023-03-19 MED ORDER — TRAZODONE HCL 100 MG PO TABS
100.0000 mg | ORAL_TABLET | Freq: Every evening | ORAL | Status: DC | PRN
  Administered 2023-03-20 – 2023-03-21 (×2): 100 mg via ORAL
  Filled 2023-03-19 (×2): qty 1

## 2023-03-19 MED ORDER — NETARSUDIL DIMESYLATE 0.02 % OP SOLN
1.0000 [drp] | Freq: Every day | OPHTHALMIC | Status: DC
  Administered 2023-03-21 – 2023-03-22 (×2): 1 [drp] via OPHTHALMIC
  Filled 2023-03-19: qty 1

## 2023-03-19 MED ORDER — ROPINIROLE HCL 0.5 MG PO TABS
1.0000 mg | ORAL_TABLET | Freq: Every day | ORAL | Status: DC
  Administered 2023-03-19 – 2023-03-21 (×3): 1 mg via ORAL
  Filled 2023-03-19 (×3): qty 2

## 2023-03-19 MED ORDER — NYSTATIN 100000 UNIT/GM EX POWD
1.0000 | Freq: Three times a day (TID) | CUTANEOUS | Status: DC
  Administered 2023-03-22: 1 via TOPICAL
  Filled 2023-03-19 (×2): qty 15

## 2023-03-19 MED ORDER — PANTOPRAZOLE SODIUM 40 MG PO TBEC
40.0000 mg | DELAYED_RELEASE_TABLET | Freq: Every day | ORAL | Status: DC
  Administered 2023-03-20 – 2023-03-22 (×3): 40 mg via ORAL
  Filled 2023-03-19 (×3): qty 1

## 2023-03-19 MED ORDER — ALBUTEROL SULFATE HFA 108 (90 BASE) MCG/ACT IN AERS: 2.0000 | INHALATION_SPRAY | Freq: Four times a day (QID) | RESPIRATORY_TRACT | Status: DC | PRN

## 2023-03-19 MED ORDER — DICLOFENAC SODIUM 1 % EX GEL
2.0000 g | Freq: Four times a day (QID) | CUTANEOUS | Status: DC
  Administered 2023-03-19 (×2): 2 g via TOPICAL
  Filled 2023-03-19 (×2): qty 100

## 2023-03-19 MED ORDER — GABAPENTIN 300 MG PO CAPS
600.0000 mg | ORAL_CAPSULE | Freq: Three times a day (TID) | ORAL | Status: DC
  Administered 2023-03-19 – 2023-03-22 (×9): 600 mg via ORAL
  Filled 2023-03-19 (×9): qty 2

## 2023-03-19 MED ORDER — LORATADINE 10 MG PO TABS
10.0000 mg | ORAL_TABLET | Freq: Every day | ORAL | Status: DC
  Administered 2023-03-20 – 2023-03-21 (×2): 10 mg via ORAL
  Filled 2023-03-19 (×3): qty 1

## 2023-03-19 MED ORDER — CHOLECALCIFEROL 10 MCG (400 UNIT) PO TABS
1000.0000 [IU] | ORAL_TABLET | Freq: Every day | ORAL | Status: DC
  Filled 2023-03-19 (×4): qty 3

## 2023-03-19 MED ORDER — ENOXAPARIN SODIUM 40 MG/0.4ML IJ SOSY: 40.0000 mg | PREFILLED_SYRINGE | INTRAMUSCULAR | Status: DC

## 2023-03-19 MED ORDER — CHLORHEXIDINE GLUCONATE CLOTH 2 % EX PADS
6.0000 | MEDICATED_PAD | Freq: Every day | CUTANEOUS | Status: DC
  Administered 2023-03-19 – 2023-03-21 (×4): 6 via TOPICAL

## 2023-03-19 MED ORDER — ENOXAPARIN SODIUM 80 MG/0.8ML IJ SOSY
80.0000 mg | PREFILLED_SYRINGE | INTRAMUSCULAR | Status: DC
  Administered 2023-03-19 – 2023-03-21 (×3): 80 mg via SUBCUTANEOUS
  Filled 2023-03-19 (×3): qty 0.8

## 2023-03-19 NOTE — ED Notes (Signed)
Patient is very upset that I would not leave the siderail downI instructed the patient that for safety reasons I would have to keep the rail up.

## 2023-03-19 NOTE — ED Triage Notes (Signed)
Patient arrived with EMS from home reports SOB with chest tightness onset yesterday .

## 2023-03-19 NOTE — ED Provider Notes (Signed)
[place] EMERGENCY DEPARTMENT AT [facility] Provider Note   CSN: [id number] Arrival date & time: 03/19/23  0510     History  Chief Complaint  Patient presents with   Shortness of Breath    [name] is a 47 y.o. female.  history of HfpEF (recent ECHO have not been able to assess EF or diastolic function), OHS, OSA, COPD, HTN, T2DM Presenting with increased swelling to her legs, abdomen for the past 2 days as well as difficulty breathing over the past 1 day.  States she become more tachypneic and short of breath despite using her home oxygen of 3 to 4 L and CPAP at night.  States compliance with her medications including her torsemide 100 mg twice daily.  No recent medication changes.  Feels tight in her chest and has a nonproductive cough.  Denies fever.  Has had increased swelling to her legs as well as her abdomen.  Has not had increase her home oxygen.  No history of DVT or PE.  No blood thinner use.  The history is provided by the patient and the EMS personnel.  Shortness of Breath Associated symptoms: cough   Associated symptoms: no abdominal pain, no chest pain, no fever, no headaches, no rash and no vomiting        Home Medications Prior to Admission medications   Medication Sig Start Date End Date Taking? Authorizing Provider  acetaminophen (TYLENOL) 325 MG tablet Take 1-2 tablets (325-650 mg total) by mouth every 4 (four) hours as needed for mild pain. 06/04/22   Setzer, Lynnell Jude, PA-C  amLODipine (NORVASC) 10 MG tablet Take 1 tablet (10 mg total) by mouth daily. 09/27/22   Latrelle Dodrill, MD  aspirin EC 81 MG tablet Take 1 tablet (81 mg total) by mouth daily. Swallow whole. 11/19/20   Tonny Bollman, MD  Brinzolamide-Brimonidine [facility]) 1-0.2 % SUSP Place 1 drop into the left eye in the morning, at noon, and at bedtime.    [provider]  budesonide-formoterol (SYMBICORT) 160-4.5 MCG/ACT inhaler Inhale 2 puffs into the lungs 2 (two)  times daily. 03/01/23   Latrelle Dodrill, MD  cetirizine (ZYRTEC) 10 MG tablet Take 1 tablet (10 mg total) by mouth daily as needed for allergies. 02/11/22   Latrelle Dodrill, MD  Continuous Blood Gluc Sensor (DEXCOM G6 SENSOR) MISC Inject 1 applicator into the skin as directed. Change sensor every 10 days. 11/05/21   Latrelle Dodrill, MD  Continuous Blood Gluc Transmit (DEXCOM G6 TRANSMITTER) MISC Inject 1 Device into the skin as directed. Reuse 8 times with sensor changes. 09/03/21   Latrelle Dodrill, MD  diclofenac Sodium (VOLTAREN) 1 % GEL Apply 2 g topically 4 (four) times daily. 02/26/23   Fayette Pho, MD  DULoxetine (CYMBALTA) 60 MG capsule Take 2 capsules (120 mg total) by mouth daily. 12/13/22   Latrelle Dodrill, MD  gabapentin (NEURONTIN) 600 MG tablet Start with 1 tablet three times daily, can gradually increase to 2 tablets three times daily 03/07/23   Latrelle Dodrill, MD  GNP ULTICARE PEN NEEDLES 32G X 4 MM MISC USE TO inject insulin 2 TIMES DAILY 10/02/21   Latrelle Dodrill, MD  insulin aspart (NOVOLOG FLEXPEN) 100 UNIT/ML FlexPen Inject 20 Units into the skin 3 (three) times daily with meals. Patient taking differently: Inject 30 Units into the skin 3 (three) times daily with meals. 05/25/22   Maury Dus, MD  insulin degludec (TRESIBA FLEXTOUCH) 200 UNIT/ML FlexTouch Pen  Inject 52 Units into the skin 2 (two) times daily. Patient taking differently: Inject 60 Units into the skin 2 (two) times daily. 06/04/22   Setzer, Lynnell Jude, PA-C  Insulin Disposable Pump (OMNIPOD 5 G6 POD, GEN 5,) MISC Inject into the skin daily. 07/30/22   [provider]  ipratropium-albuterol (DUONEB) 0.5-2.5 (3) MG/3ML SOLN Take 3 mLs by nebulization every 6 (six) hours as needed. Patient taking differently: Take 3 mLs by nebulization every 6 (six) hours as needed (for shortness of breath and wheezing). 09/24/22   Charlott Holler, MD  latanoprost (XALATAN) 0.005 % ophthalmic  solution Place 1 drop into both eyes daily. 02/04/23   [provider]  lidocaine (LIDODERM) 5 % Place 1 patch onto the skin daily. Remove & Discard patch within 12 hours or as directed by MD 02/27/23   Fayette Pho, MD  Multiple Vitamins-Minerals (ONE-A-DAY WOMENS) tablet Take 1 tablet by mouth daily.    [provider]  nitroGLYCERIN (NITROSTAT) 0.4 MG SL tablet Place 1 tablet (0.4 mg total) under the tongue every 5 (five) minutes as needed for chest pain. 11/04/22   Latrelle Dodrill, MD  nystatin (MYCOSTATIN/NYSTOP) powder Apply 1 Application topically 3 (three) times daily. 03/07/23   Latrelle Dodrill, MD  ondansetron (ZOFRAN) 4 MG tablet Take 1 tablet (4 mg total) by mouth every 8 (eight) hours as needed for nausea or vomiting. 02/17/22   Lovorn, Aundra Millet, MD  pantoprazole (PROTONIX) 40 MG tablet Take 1 tablet (40 mg total) by mouth daily. 09/27/22   Latrelle Dodrill, MD  PROAIR HFA 108 [phone] Base) MCG/ACT inhaler INHALE 1 TO 2 PUFFS BY MOUTH EVERY 6 HOURS AS NEEDED FOR WHEEZING OR SHORTNESS OF BREATH Patient taking differently: Inhale 2 puffs into the lungs every 6 (six) hours as needed for wheezing or shortness of breath. 10/05/22   Latrelle Dodrill, MD  RHOPRESSA 0.02 % SOLN Place 1 drop into the left eye daily. 01/21/23   [provider]  rOPINIRole (REQUIP) 1 MG tablet Take 1 tablet (1 mg total) by mouth at bedtime. 12/16/22   Latrelle Dodrill, MD  rosuvastatin (CRESTOR) 40 MG tablet Take 1 tablet (40 mg total) by mouth daily. 11/04/22   Latrelle Dodrill, MD  torsemide (DEMADEX) 100 MG tablet Take 1 tablet (100 mg total) by mouth 2 (two) times daily. 02/26/23   Fayette Pho, MD  traZODone (DESYREL) 100 MG tablet TAKE 1 TABLET BY MOUTH AT BEDTIME AS NEEDED FOR SLEEP 03/03/23   Latrelle Dodrill, MD  Vitamin D, Cholecalciferol, 25 MCG (1000 UT) CAPS Take 1,000 Units by mouth daily. 10/05/22   Latrelle Dodrill, MD      Allergies    Cefepime,  Iodine, Kiwi extract, Morphine and related, Pentoxifylline, Cephalosporins, Toradol [ketorolac tromethamine], and Nubain [nalbuphine hcl]    Review of Systems   Review of Systems  Constitutional:  Negative for activity change, appetite change and fever.  HENT:  Negative for congestion and rhinorrhea.   Respiratory:  Positive for cough, chest tightness and shortness of breath.   Cardiovascular:  Positive for leg swelling. Negative for chest pain.  Gastrointestinal:  Negative for abdominal pain, nausea and vomiting.  Genitourinary:  Negative for dysuria and hematuria.  Musculoskeletal:  Negative for arthralgias and myalgias.  Skin:  Negative for rash.  Neurological:  Negative for dizziness, weakness and headaches.   all other systems are negative except as noted in the HPI and PMH.    Physical Exam Updated  Vital Signs BP (!) 146/77 (BP Location: Right Wrist)   Pulse 93   Temp 97.7 F (36.5 C) (Oral)   Resp 17   SpO2 99%  Physical Exam Vitals and nursing note reviewed.  Constitutional:      General: She is in acute distress.     Appearance: She is well-developed. She is obese.     Comments: Moderate increased work of breathing, speaking in short phrases  HENT:     Head: Normocephalic and atraumatic.     Mouth/Throat:     Pharynx: No oropharyngeal exudate.  Eyes:     Conjunctiva/sclera: Conjunctivae normal.     Pupils: Pupils are equal, round, and reactive to light.  Neck:     Comments: No meningismus. Cardiovascular:     Rate and Rhythm: Normal rate and regular rhythm.     Heart sounds: Normal heart sounds. No murmur heard. Pulmonary:     Effort: Respiratory distress present.     Breath sounds: Normal breath sounds.     Comments: Diminished Abdominal:     Palpations: Abdomen is soft.     Tenderness: There is no abdominal tenderness. There is no guarding or rebound.  Musculoskeletal:        General: No tenderness. Normal range of motion.     Cervical back: Normal range  of motion and neck supple.     Right lower leg: Edema present.     Left lower leg: Edema present.     Comments: Left BKA, right transmetatarsal amputation  Skin:    General: Skin is warm.  Neurological:     Mental Status: She is alert and oriented to person, place, and time.     Cranial Nerves: No cranial nerve deficit.     Motor: No abnormal muscle tone.     Coordination: Coordination normal.     Comments:  5/5 strength throughout. CN 2-12 intact.Equal grip strength.   Psychiatric:        Behavior: Behavior normal.     ED Results / Procedures / Treatments   Labs (all labs ordered are listed, but only abnormal results are displayed) Labs Reviewed  CBC WITH DIFFERENTIAL/PLATELET - Abnormal; Notable for the following components:      Result Value   RDW 15.8 (*)    Abs Immature Granulocytes 0.11 (*)    All other components within normal limits  COMPREHENSIVE METABOLIC PANEL - Abnormal; Notable for the following components:   Sodium 133 (*)    Chloride 88 (*)    CO2 33 (*)    Glucose, Bld 297 (*)    BUN 81 (*)    Creatinine, Ser 1.74 (*)    Albumin 3.0 (*)    AST 13 (*)    GFR, Estimated 36 (*)    All other components within normal limits  I-STAT CHEM 8, ED - Abnormal; Notable for the following components:   Sodium 134 (*)    Chloride 91 (*)    BUN 91 (*)    Creatinine, Ser 1.80 (*)    Glucose, Bld 301 (*)    TCO2 35 (*)    All other components within normal limits  I-STAT VENOUS BLOOD GAS, ED - Abnormal; Notable for the following components:   pCO2, Ven 72.2 (*)    Bicarbonate 34.7 (*)    TCO2 37 (*)    Acid-Base Excess 6.0 (*)    Sodium 133 (*)    All other components within normal limits  TROPONIN I (HIGH SENSITIVITY) -  Abnormal; Notable for the following components:   Troponin I (High Sensitivity) 27 (*)    All other components within normal limits  TROPONIN I (HIGH SENSITIVITY) - Abnormal; Notable for the following components:   Troponin I (High Sensitivity)  22 (*)    All other components within normal limits  BRAIN NATRIURETIC PEPTIDE  PROTIME-INR  D-DIMER, QUANTITATIVE    EKG EKG Interpretation  Date/Time:  Saturday March 19 2023 05:41:58 EDT Ventricular Rate:  90 PR Interval:  168 QRS Duration: 111 QT Interval:  397 QTC Calculation: 486 R Axis:   105 Text Interpretation: Sinus rhythm Right axis deviation Low voltage, precordial leads Consider anterior infarct Minimal ST elevation, inferior leads No significant change was found Confirmed by Glynn Octave [phone]) on 03/19/2023 5:46:07 AM  Radiology DG Chest Portable 1 View  Result Date: 03/19/2023 CLINICAL DATA:  Short of breath. EXAM: PORTABLE CHEST 1 VIEW COMPARISON:  02/24/2023 FINDINGS: Stable cardiac enlargement. Unchanged asymmetric elevation of right hemidiaphragm. No pleural fluid, interstitial edema or airspace disease. Visualized osseous structures are grossly unremarkable. IMPRESSION: Cardiac enlargement.  No acute cardiopulmonary abnormalities. Electronically Signed   By: Signa Kell M.D.   On: 03/19/2023 05:48    Procedures .Critical Care  Performed by: Glynn Octave, MD Authorized by: Glynn Octave, MD   Critical care provider statement:    Critical care time (minutes):  35   Critical care time was exclusive of:  Separately billable procedures and treating other patients   Critical care was necessary to treat or prevent imminent or life-threatening deterioration of the following conditions:  Respiratory failure   Critical care was time spent personally by me on the following activities:  Development of treatment plan with patient or surrogate, discussions with consultants, evaluation of patient's response to treatment, examination of patient, ordering and review of laboratory studies, ordering and review of radiographic studies, ordering and performing treatments and interventions, pulse oximetry, re-evaluation of patient's condition, review of old charts, blood  draw for specimens and obtaining history from patient or surrogate   I assumed direction of critical care for this patient from another provider in my specialty: no     Care discussed with: admitting provider       Medications Ordered in ED Medications - No data to display  ED Course/ Medical Decision Making/ A&P                             Medical Decision Making Amount and/or Complexity of Data Reviewed Independent Historian: EMS Labs: ordered. Decision-making details documented in ED Course. Radiology: ordered and independent interpretation performed. Decision-making details documented in ED Course. ECG/medicine tests: ordered and independent interpretation performed. Decision-making details documented in ED Course.  Risk Prescription drug management. Decision regarding hospitalization.  Increased work of breathing, swelling for the past several days.  Some chest tightness.  No hypoxia on her home oxygen.  Does have i tachypnea with diminished breath sounds bilaterally.  She is given a dose of IV Lasix.  Blood pressure is normal.  Chest ray is negative for infiltrate or pleural effusion.  Results reviewed interpreted by me.  VBG does show pH 7.29, pCO2 72 Will place on bipap for hypercarbia likely secondary to obesity hypoventilation syndrome.   Creatinine elevated to 1.7. Hyperglycemia without DKA. Troponin flat.   D-dimer pending.  Suspect respiratory distress secondary to diastolic CHF.   D/w Family practice residents who will evaluate for admission.  Final Clinical Impression(s) / ED Diagnoses Final diagnoses:  Acute on chronic respiratory failure with hypercapnia    Rx / DC Orders ED Discharge Orders     None         Maziah Keeling, Jeannett Senior, MD 03/19/23 [id number]

## 2023-03-19 NOTE — Assessment & Plan Note (Addendum)
Pain with extension from flexion, with mild crepitus on exam, suggestive of OA. R knee XR with marked tricompartmental changes in 09/2022. -PT/OT eval and treat -IV tylenol 1000 mg Q6H until off BiPAP -Voltaren gel

## 2023-03-19 NOTE — Assessment & Plan Note (Signed)
Present around popliteal fossa of RLE, likely due to retained moisture. -Nystatin powder -WOC following

## 2023-03-19 NOTE — Assessment & Plan Note (Addendum)
Pending CMP. There is c/f for obstructive pathology based on history in addition to volume overload (trace R hydronephrosis on [place]), has foley in place. Plan for void trial prior to discharge and consider Urology referral. -Place foley and measure output -Follow-up creatinine in a.m. -Encourage p.o.

## 2023-03-19 NOTE — Assessment & Plan Note (Addendum)
Stable on RA. Still suspect chronic hypercapnia, likely due to OHS/COPD/OSA. Breathing has improved with diuresis; will plan on altering regimen at d/c. -Continue supplemental O2 as needed -Restart home torsemide 100 mg BID with metolazone 2.5 mg daily -Strict I/O's -AM CBC, CMP

## 2023-03-19 NOTE — Hospital Course (Signed)
[name] is a 47 y.o. female who was admitted to the [facility] Medicine Teaching Service at [facility] for acute on chronic hypercapnic respiratory failure. Hospital course is outlined below by problem.   Acute on chronic hypercapnic respiratory failure Presented with increased work of breathing and subjective swelling of lower extremities and abdomen.  She was afebrile, not hypoxic with normal oxygen, but did have tachypnea and diminished bilateral breath sounds.  Chest x-ray with cardiomegaly without infiltrate.  VBG significant for 7.29/72/41.  She was given IV Lasix and admitted for further oxygen support on BiPAP.  She was weaned off BiPAP and was satting well on room air by second day of admission. She was restarted on her home torsemide dosing with the addition of metolazone 2.5 mg daily for diuresis at discharge.  AKI Creatinine 1.74 on admission up from baseline of around 1.  Sodium was mildly low at 133.  She reported only 1-2 episodes of urination since last admission with increased torsemide to 100 mg twice daily. Foley was placed with large amount of urine.  Renal ultrasound was collected which demonstrated trace R hydronephrosis. Voiding trial was performed, she was able to urinate well with bladder scans of 0. Suspect AKI due to volume overload. By discharge, Cr remained stable at 1.56.  OA of right knee Endorsed pain in her right knee with some swelling.  On exam, knee was painful when going from flexion to extension.  Mild crepitus was observed.  Right knee x-ray October 2023 with marked tricompartmental changes.  IV Tylenol 1000 mg every 6 was given as she was on BiPAP as well as Voltaren gel.  Once off BiPAP, tylenol and 2x Norco doses given for pain. PT and OT evaluated with recommendations for home health PT/OT. She was set up with outpatient PT given insurance concerns.  Chest pain Presented with episode of left-sided chest pain reproducible to palpation once over admission.  Troponins  and EKG were unchanged from prior.  Pain improved with 1 dose of oxycodone.  Consider recurrence of known chronic musculoskeletal chest pain.  Intertrigo Presented with intertrigo around popliteal fossa of the right lower extremity and R axilla.  Most likely due to retained moisture.  Home nystatin powder continued.  Type 2 diabetes mellitus Home regimen of Semglee 60 units twice daily and NovoLog 20 units 3 times daily with meals continued given acute illness.  She was started on Semglee 30 units twice daily as well as resistant SSI, though CBGs remained in 300s. By discharge, she was placed back on her home insulin regimen.  Chronic and stable conditions: HLD: continued Crestor 40mg  COPD: Dulera (substitute for Symbicort) GERD: continued Protonix 40mg  Diabetic neuropathy: continued Gabapentin 1200mg  TID and Duloxetine 120mg  daily HTN: continued home Amlodipine 10mg  OSA: continued BiPap nightly  Issues for follow up: Ensure improved respiratory status and volume status on new diuretic regimen Recheck BMP for AKI Evaluate effectiveness of nystatin powder for intertrigo Assess R leg pain with PT

## 2023-03-19 NOTE — Progress Notes (Addendum)
FMTS Interim Progress Note  S: Patient reports her breathing has improved.  She reports she urinated about 800 mL after getting Lasix in the ED.  She has urinated a good amount after second dose of IV Lasix earlier this evening but is unsure of the amount.  She reports that she has not been having much urine output with her home torsemide even after her dose was increased to 100 mg.  Patient reports she is having intermittent severe bilateral patellar and knee pain, reports Tylenol has not been effective.  O: BP 110/79 (BP Location: Right Arm)   Pulse 100   Temp 98.3 F (36.8 C) (Oral)   Resp 15   Ht 5\' 2"  (1.575 m)   Wt (!) 164.4 kg   SpO2 98%   BMI 66.29 kg/m   General: Alert, NAD Cardiovascular: RRR, no murmurs Pulmonary: Clear to auscultation bilaterally, breathing comfortably on nasal cannula  A/P: Respiratory status improving with diuresis, responding well to IV furosemide.  Suspect her home diuretic regimen may need to be adjusted given poor reported response. Knee pain likely due to OA as evidenced by prior imaging, will defer adjusting pain regimen at this time-would avoid NSAIDs due to AKI.  Maybe would benefit from corticosteroid injection outpatient.  Littie Deeds, MD 03/19/2023, 10:11 PM PGY-3, [facility] Family Medicine Service pager [phone]

## 2023-03-19 NOTE — Assessment & Plan Note (Signed)
Home regimen of semglee 60 units BID and novolog 20u TID with meals. Will continue on below management given she is currently ill and titrate accordingly. -Semglee 30u BID -rSSI

## 2023-03-19 NOTE — H&P (Addendum)
Hospital Admission History and Physical Service Pager: [phone]  Patient name: Khianna Blazina Medical record number: [id number] Date of Birth: [birth year] Age: 47 y.o. Gender: female  Primary Care Provider: Latrelle Dodrill, MD Consultants: None Code Status: FULL Preferred Emergency Contact: Daughter - goes by "[facility] Information     Name Relation Home Work Mobile   Sahily, Biddle Clay) Daughter   [phone]      Chief Complaint: Shortness of breath and leg swelling  Assessment and Plan: Brayleigh Rybacki is a 47 y.o. female presenting with increased leg swelling and shortness of breath. Differential for this patient's presentation of this includes viral exacerbation of OHS (most likely given viral symptoms of nonproductive cough with tightness in her chest as well as lab evidence of hypercarbia though her lack of fever would be more atypical), COPD exacerbation (more likely given her history and increased cough with hypercarbia, though no increase sputum production would be less typical), acute heart failure exacerbation (more likely given her history and subjective increase in volume status with cardiomegaly on chest x-ray, though BNP is normal and interstitial fluid/pulmonary edema not appreciated on x-ray), pneumonia (less likely given lack of focal crackles on exam and negative chest x-ray for infiltrate), PE (less likely given normal pO2 on VBG and improvement with O2 in the emergency room), and ACS (less likely given no increased chest pain, no significant EKG changes, and flat troponins).  * Acute on chronic hypercapnic respiratory failure Now stable on BiPAP.  Breathing appears labored; otherwise, breath sounds are diminished.  Consider limited exam due to body habitus. Does have slight pitting edema to BLE which could indicate some element of CHF.  Previous hospitalizations have demonstrated chronic hypercapnia, likely due to OHS/COPD/OSA. Given she is retaining CO2 in  the context of acutely worsening symptoms, will admit to FMTS attending Dr. Pollie Meyer for further oxygen support and diuresis. -Continue BiPAP and supplemental O2, wean as tolerated -Status post IV Lasix 80 mg, consider redosing this PM based on response to above lasix -Strict I/O's -AM CBC, CMP  AKI (acute kidney injury) Creatinine 1.74 on most recent check, up from baseline of around 1.  Sodium is mildly low at 133. She is status post 1 dose of Lasix.  She reports 1-2 episodes of urination since last admission with torsemide 100 mg twice daily and history of needing an I/O cath since bladder scans ineffective.  Could consider obstruction with this history. Volume overload on differential (?not responsive to torsemide dosing) and could be contributing. -Place foley and measure output -Renal US to r/o hydronephrosis -Follow-up creatinine in a.m. -Encourage p.o.  Type 2 diabetes mellitus with hyperglycemia Home regimen of semglee 60 units BID and novolog 20u TID with meals. Will continue on below management given she is currently ill and titrate accordingly. -Semglee 30u BID -rSSI  Osteoarthritis of right knee Pain with extension from flexion, with mild crepitus on exam, suggestive of OA. R knee XR with marked tricompartmental changes in 09/2022. -PT/OT eval and treat -IV tylenol 1000 mg Q6H until off BiPAP -Voltaren gel   Intertrigo Present around popliteal fossa of RLE, likely due to retained moisture. -Nystatin powder  Chronic and stable conditions: HLD: continue Crestor  COPD: Dulera (substitute for Symbicort) GERD: continue Protonix  Diabetic neuropathy: continue Gabapentin  TID and Duloxetine  daily HTN: continue home Amlodipine  OSA: continue BiPap nightly  FEN/GI: Heart healthy/carb modified diet VTE Prophylaxis: Lovenox  Disposition: Progressive  History of Present Illness:  Jerriann Schrom is a  47 y.o. female presenting with leg swelling and SOB.    SOB started yesterday. She feels like she is swollen everywhere, legs and stomach despite taking her torsemide  BID. She has been avoiding salty foods. The SOB come on suddenly while sitting in her chair and progressively worsened overnight (even with using her CPAP) prompting her to come to ED.   Also admits to new sternal dull CP this morning - 8/9 and worse with taking a deep breath. She denies rash, fever, chills, constipation, diarrhea, sick contacts. States she doesn't urinate more than twice daily since last hospitalization, states urine is darker than usual. No abdominal pain.   Admits to occasional non productive coughing fits for past month that sometimes cause her to vomit which occurred this morning prior to coming to ED. The vomit appeared like food and was non-bloody.   She has also had bilateral knee pain, woke up in tears this morning. It hurts her more when she bends it and extends it again. She has not noticed any warmth or redness of the knee. No trauma. No rashes.  In the ED, spoke of increased work of breathing and swelling lower extremities and abdomen.  She was not hypoxic on home oxygen here.  She was afebrile.  She did have increased tachypnea with diminished bilateral breath sounds.  She given 1 dose of IV Lasix 80 mg.  Chest x-ray was significant for cardiomegaly without infiltrate or pleural effusion.  VBG significant for pH 7.29, pCO2 72.  Given hypercarbic respiratory failure, she was called for admission.  Review Of Systems: Per HPI   Pertinent Past Medical History: OHS OSA HFpEF COPD HTN HLD CAD T2DM Remainder reviewed in history tab.    Pertinent Past Surgical History: S/p L BKA and R TMA (2019, 2023)  Remainder reviewed in history tab.    Pertinent Social History: Tobacco use: Former Alcohol use: No Other Substance use: Cocaine in the past Lives with daughter, Colin Mulders   Pertinent Family History: Mother: diabetes, HLD Father: MI Remainder  reviewed in history tab.    Important Outpatient Medications: Amlodipine  daily ASA  daily Duloxetine  daily Gabapentin 1200 mg BID Insulin: tresiba 60u BID, aspart 30u TID w/meals Protonix  daily Requip  qhs Rosuvastatin  daily Torsemide  BID Trazodone  qhs prn Remainder reviewed in medication history.  Objective: BP 113/73   Pulse 86   Temp 97.7 F (36.5 C) (Oral)   Resp 19   SpO2 99%  Exam: General: Alert and oriented, in NAD Skin: Warm, dry, and intact; developing intertrigo in popliteal crease on RLE HEENT: NCAT, EOM grossly normal, midline nasal septum Cardiac: RRR, no m/r/g appreciated, 1+ pitting edema to RLE to level of shin Respiratory: CTAB though breath sounds diminished throughout, breathing and speaking comfortably on BiPAP Abdominal: Soft, TTP of epigastrium, normoactive bowel sounds Extremities: RLE s/p TMA with compression stocking and mild crepitus with extension, LLE s/p BKA with sock overlying, WWP Neurological: No gross focal deficit Psychiatric: Anxious mood and affect  Labs:  CBC BMET  Recent Labs  Lab 03/19/23 0530 03/19/23 0631  WBC 9.6  --   HGB 12.3  14.3 13.3  HCT 40.2  42.0 39.0  PLT 320  --    Recent Labs  Lab 03/19/23 0530 03/19/23 0631  NA 133*  134* 133*  K 4.0  4.1 3.8  CL 88*  91*  --   CO2 33*  --   BUN 81*  91*  --  CREATININE 1.74*  1.80*  --   GLUCOSE 297*  301*  --   CALCIUM 9.1  --      Troponin 27 BNP 16 Protime 13.4 INR 1.0 VBG with pH 7.289, pCO2 72.2, pO2 41, bicarb 34.7  EKG: NSR, normal rate, not significantly changed from prior  Imaging Studies Performed: CXR IMPRESSION: Cardiac enlargement. No acute cardiopulmonary abnormalities.  [name], MD 03/19/2023, 10:41 AM PGY-1, [facility] FPTS Intern pager: [phone], text pages welcome Secure chat group [facility] Teaching Service

## 2023-03-19 NOTE — ED Notes (Signed)
Pt requesting pain meds. MD notified and reminded about patient being on BiPap.

## 2023-03-19 NOTE — ED Notes (Signed)
ED TO INPATIENT HANDOFF REPORT  ED Nurse Name and Phone #: [phone]  S Name/Age/Gender [name] 47 y.o. female Room/Bed: 019C/019C  Code Status   Code Status: Full Code  Home/SNF/Other Home Patient oriented to: self, place, time, and situation Is this baseline? Yes   Triage Complete: Triage complete  Chief Complaint Acute hypercapnic respiratory failure [J96.02]  Triage Note Patient arrived with EMS from home reports SOB with chest tightness onset yesterday .    Allergies Allergies  Allergen Reactions   Cefepime Other (See Comments)    AKI, see records from [facility] hospitalization in January 2020.   Other reaction(s): Other (See Comments) "Shut down organs/kidneys"  Note pt has tolerated Rocephin and Keflex Other reaction(s): Not available   Iodine Other (See Comments)    Kidney dysfunction   Kiwi Extract Shortness Of Breath, Swelling and Anaphylaxis    Other reaction(s): Not available   Morphine And Related Nausea And Vomiting   Pentoxifylline Nausea And Vomiting    Trental   Cephalosporins Other (See Comments)    Pt states that they cause her kidneys to shut down   Toradol [Ketorolac Tromethamine] Other (See Comments)    Feels like something is crawling on me   Nubain [Nalbuphine Hcl] Other (See Comments)    "FEELS LIKE SOMETHING CRAWLING ON ME" Hallucinations     Level of Care/Admitting Diagnosis ED Disposition     ED Disposition  Admit   Condition  --   Comment  Hospital Area: [facility] [[id number]]  Level of Care: Progressive [102]  Admit to Progressive based on following criteria: RESPIRATORY PROBLEMS hypoxemic/hypercapnic respiratory failure that is responsive to NIPPV (BiPAP) or High Flow Nasal Cannula (6-80 lpm). Frequent assessment/intervention, no > Q2 hrs < Q4 hrs, to maintain oxygenation and pulmonary hygiene.  May place patient in observation at [facility] or [facility] if equivalent level of care is available:: No  Covid  Evaluation: Asymptomatic - no recent exposure (last 10 days) testing not required  Diagnosis: Acute hypercapnic respiratory failure [[id number]]  Admitting Physician: Evette Georges [[id number]]  Attending Physician: Latrelle Dodrill [id number]          B Medical/Surgery History Past Medical History:  Diagnosis Date   Abscess of groin, left    Acute on chronic diastolic (congestive) heart failure 02/05/2022   Acute on chronic respiratory failure with hypoxia 04/11/2022   Acute osteomyelitis of ankle or foot, left 01/31/2018   Acute respiratory failure with hypoxia and hypercarbia 06/15/2020   Acute tubular necrosis 07/12/2022   Alveolar hypoventilation    Amputation stump infection 04/09/2018   Anemia    not on iron pill   Asthma    Bipolar 2 disorder    Candidiasis of vagina 05/15/2022   Carpal tunnel syndrome on right    recurrent   Cellulitis 08/2010-08/2011   Chest pain with low risk for cardiac etiology 05/11/2021   Chronic pain    COPD (chronic obstructive pulmonary disease)    Symbicort daily and Proventil as needed   Costochondritis    De Quervain's tenosynovitis, bilateral 11/01/2015   Depression    Diabetes mellitus type II, uncontrolled 2000   Type 2, Uncontrolled.Takes Lantus daily.Fasting blood sugar runs 150   Diabetic ulcer of right foot 12/19/2021   Drug-seeking behavior    Elevated troponin    Exposure to mold 04/21/2020   GERD (gastroesophageal reflux disease)    takes Pantoprazole and Zantac daily   HLD (hyperlipidemia)    takes Atorvastatin daily  Hypertension    takes Lisinopril and Coreg daily   Left below-knee amputee 04/11/2019   Left shoulder pain 06/23/2019   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  Mild diffuse proximal LAD stenosis with no evidence of obstructive disease (20  to 30% diffuse stenosis);  2.  Left dominant circumflex with widely patent obtuse marginal branches and left PDA branch, no significant stenoses present  3.  Nondominant RCA with mild diffuse nonobstructive stenosis    OSA on CPAP    Peripheral neuropathy    takes Gabapentin daily   Pneumonia    "walking" several yrs ago and as a baby (12/05/2018)   Pneumonia due to COVID-19 virus 04/14/2021   Primary osteoarthritis of first carpometacarpal joint of left hand 07/30/2016   Rectal fissure    Restless leg    Right carpal tunnel syndrome 09/01/2011   SVT (supraventricular tachycardia)    Syncope 02/25/2016   Thrombocytosis 04/11/2022   Urinary frequency    Urinary incontinence 10/23/2020   UTI (urinary tract infection) 04/15/2021   Varicose veins    Right medial thigh and Left leg    Past Surgical History:  Procedure Laterality Date   AMPUTATION Left 02/01/2018   Procedure: LEFT FOURTH AND 5TH TOE RAY AMPUTATION;  Surgeon: Nadara Mustard, MD;  Location: [facility] OR;  Service: Orthopedics;  Laterality: Left;   AMPUTATION Left 03/03/2018   Procedure: LEFT BELOW KNEE AMPUTATION;  Surgeon: Nadara Mustard, MD;  Location: [facility] OR;  Service: Orthopedics;  Laterality: Left;   AMPUTATION Right 05/21/2022   Procedure: REVISION AMPUTATION RAY 1ST;  Surgeon: Candelaria Stagers, DPM;  Location: [facility] OR;  Service: Podiatry;  Laterality: Right;   CARPAL TUNNEL RELEASE Bilateral    CESAREAN SECTION  2007   CORONARY ANGIOGRAPHY N/A 11/26/2020   Procedure: CORONARY ANGIOGRAPHY;  Surgeon: Tonny Bollman, MD;  Location: [facility] INVASIVE CV LAB;  Service: Cardiovascular;  Laterality: N/A;   FOOT AMPUTATION Right    INCISION AND DRAINAGE ABSCESS Left 10/16/2021   Procedure: INCISION AND DRAINAGE ABSCESS;  Surgeon: Fritzi Mandes, MD;  Location: [facility] OR;  Service: General;  Laterality: Left;   INCISION AND DRAINAGE PERIRECTAL ABSCESS Left 05/18/2019   Procedure: IRRIGATION AND DEBRIDEMENT OF PANNIS ABSCESS, POSSIBLE  DEBRIDEMENT OF BUTTOCK WOUND;  Surgeon: Manus Rudd, MD;  Location: [facility] OR;  Service: General;  Laterality: Left;   IRRIGATION AND DEBRIDEMENT BUTTOCKS Left 05/17/2019   Procedure: IRRIGATION AND DEBRIDEMENT BUTTOCKS;  Surgeon: Manus Rudd, MD;  Location: [facility] OR;  Service: General;  Laterality: Left;   KNEE ARTHROSCOPY Right 07/17/2010   LEFT HEART CATHETERIZATION WITH CORONARY ANGIOGRAM N/A 07/27/2012   Procedure: LEFT HEART CATHETERIZATION WITH CORONARY ANGIOGRAM;  Surgeon: Tonny Bollman, MD;  Location: [facility] CATH LAB;  Service: Cardiovascular;  Laterality: N/A;   MASS EXCISION N/A 06/29/2013   Procedure:  WIDE LOCAL EXCISION OF POSTERIOR NECK ABSCESS;  Surgeon: Axel Filler, MD;  Location: [facility] OR;  Service: General;  Laterality: N/A;   REPAIR KNEE LIGAMENT Left    "fixed ligaments and chipped patella"   right transmetatarsal amputation      TRACHEOSTOMY TUBE PLACEMENT N/A 07/10/2022   Procedure: CONTROLLED EXTUBATION;  Surgeon: Serena Colonel, MD;  Location: [facility] OR;  Service: ENT;  Laterality: N/A;     A IV Location/Drains/Wounds Patient Lines/Drains/Airways Status  Active Line/Drains/Airways     Name Placement date Placement time Site Days   Peripheral IV 03/19/23 18 G Left;Upper Arm 03/19/23  0539  Arm  less than 1   Urethral Catheter Lew Dawes RN" Non-latex 14 Fr. 03/19/23  1113  Non-latex  less than 1            Intake/Output Last 24 hours  Intake/Output Summary (Last 24 hours) at 03/19/2023 1511 Last data filed at 03/19/2023 1213 Gross per 24 hour  Intake 100 ml  Output 600 ml  Net -500 ml    Labs/Imaging Results for orders placed or performed during the hospital encounter of 03/19/23 (from the past 48 hour(s))  CBC with Differential     Status: Abnormal   Collection Time: 03/19/23  5:30 AM  Result Value Ref Range   WBC 9.6 4.0 - 10.5 K/uL   RBC 4.51 3.87 - 5.11 MIL/uL   Hemoglobin 12.3 12.0 - 15.0 g/dL   HCT 16.1 09.6 - 04.5 %   MCV 89.1 80.0 - 100.0 fL    MCH 27.3 26.0 - 34.0 pg   MCHC 30.6 30.0 - 36.0 g/dL   RDW 40.9 (H) 81.1 - 91.4 %   Platelets 320 150 - 400 K/uL   nRBC 0.0 0.0 - 0.2 %   Neutrophils Relative % 51 %   Neutro Abs 5.0 1.7 - 7.7 K/uL   Lymphocytes Relative 35 %   Lymphs Abs 3.3 0.7 - 4.0 K/uL   Monocytes Relative 9 %   Monocytes Absolute 0.9 0.1 - 1.0 K/uL   Eosinophils Relative 3 %   Eosinophils Absolute 0.3 0.0 - 0.5 K/uL   Basophils Relative 1 %   Basophils Absolute 0.1 0.0 - 0.1 K/uL   Immature Granulocytes 1 %   Abs Immature Granulocytes 0.11 (H) 0.00 - 0.07 K/uL    Comment: Performed at [facility] Lab, [address] [place], [place] [address]  Comprehensive metabolic panel     Status: Abnormal   Collection Time: 03/19/23  5:30 AM  Result Value Ref Range   Sodium 133 (L) 135 - 145 mmol/L   Potassium 4.0 3.5 - 5.1 mmol/L   Chloride 88 (L) 98 - 111 mmol/L   CO2 33 (H) 22 - 32 mmol/L   Glucose, Bld 297 (H) 70 - 99 mg/dL    Comment: Glucose reference range applies only to samples taken after fasting for at least 8 hours.   BUN 81 (H) 6 - 20 mg/dL   Creatinine, Ser 6.21 (H) 0.44 - 1.00 mg/dL   Calcium 9.1 8.9 - 30.8 mg/dL   Total Protein 8.1 6.5 - 8.1 g/dL   Albumin 3.0 (L) 3.5 - 5.0 g/dL   AST 13 (L) 15 - 41 U/L   ALT 19 0 - 44 U/L   Alkaline Phosphatase 117 38 - 126 U/L   Total Bilirubin 0.4 0.3 - 1.2 mg/dL   GFR, Estimated 36 (L) >60 mL/min    Comment: (NOTE) Calculated using the CKD-EPI Creatinine Equation (2021)    Anion gap 12 5 - 15    Comment: Performed at [facility], [address] [place], [place] [address]  I-stat chem 8, ED (not at [facility], [facility] or [facility])     Status: Abnormal   Collection Time: 03/19/23  5:30 AM  Result Value Ref Range   Sodium 134 (L) 135 - 145 mmol/L   Potassium 4.1 3.5 - 5.1 mmol/L   Chloride 91 (L) 98 - 111  mmol/L   BUN 91 (H) 6 - 20 mg/dL   Creatinine, Ser 1.61 (H) 0.44 - 1.00 mg/dL   Glucose, Bld 096 (H) 70 - 99 mg/dL    Comment: Glucose reference range  applies only to samples taken after fasting for at least 8 hours.   Calcium, Ion 1.17 1.15 - 1.40 mmol/L   TCO2 35 (H) 22 - 32 mmol/L   Hemoglobin 14.3 12.0 - 15.0 g/dL   HCT 04.5 40.9 - 81.1 %  Troponin I (High Sensitivity)     Status: Abnormal   Collection Time: 03/19/23  5:30 AM  Result Value Ref Range   Troponin I (High Sensitivity) 27 (H) <18 ng/L    Comment: (NOTE) Elevated high sensitivity troponin I (hsTnI) values and significant  changes across serial measurements may suggest ACS but many other  chronic and acute conditions are known to elevate hsTnI results.  Refer to the "Links" section for chest pain algorithms and additional  guidance. Performed at [facility], [address] [place], [place] [address]   Brain natriuretic peptide     Status: None   Collection Time: 03/19/23  5:30 AM  Result Value Ref Range   B Natriuretic Peptide 16.0 0.0 - 100.0 pg/mL    Comment: Performed at [facility], [address], [place], [place] [address]  Protime-INR     Status: None   Collection Time: 03/19/23  5:30 AM  Result Value Ref Range   Prothrombin Time 13.4 11.4 - 15.2 seconds   INR 1.0 0.8 - 1.2    Comment: (NOTE) INR goal varies based on device and disease states. Performed at [facility], [address], [place], [place] [address]   I-Stat venous blood gas, [facility] ED, [facility], [facility])     Status: Abnormal   Collection Time: 03/19/23  6:31 AM  Result Value Ref Range   pH, Ven 7.289 7.25 - 7.43   pCO2, Ven 72.2 (HH) 44 - 60 mmHg   pO2, Ven 41 32 - 45 mmHg   Bicarbonate 34.7 (H) 20.0 - 28.0 mmol/L   TCO2 37 (H) 22 - 32 mmol/L   O2 Saturation 68 %   Acid-Base Excess 6.0 (H) 0.0 - 2.0 mmol/L   Sodium 133 (L) 135 - 145 mmol/L   Potassium 3.8 3.5 - 5.1 mmol/L   Calcium, Ion 1.18 1.15 - 1.40 mmol/L   HCT 39.0 36.0 - 46.0 %   Hemoglobin 13.3 12.0 - 15.0 g/dL   Sample type VENOUS    Comment NOTIFIED PHYSICIAN   Troponin I (High Sensitivity)     Status: Abnormal    Collection Time: 03/19/23  7:30 AM  Result Value Ref Range   Troponin I (High Sensitivity) 22 (H) <18 ng/L    Comment: (NOTE) Elevated high sensitivity troponin I (hsTnI) values and significant  changes across serial measurements may suggest ACS but many other  chronic and acute conditions are known to elevate hsTnI results.  Refer to the "Links" section for chest pain algorithms and additional  guidance. Performed at [facility], [address] [place], [place] [address]   CBG monitoring, ED     Status: Abnormal   Collection Time: 03/19/23 11:40 AM  Result Value Ref Range   Glucose-Capillary 218 (H) 70 - 99 mg/dL    Comment: Glucose reference range applies only to samples taken after fasting for at least 8 hours.   *Note: Due to a large number of results and/or  encounters for the requested time period, some results have not been displayed. A complete set of results can be found in Results Review.   US RENAL  Result Date: 03/19/2023 CLINICAL DATA:  Urinary retention EXAM: RENAL / URINARY TRACT ULTRASOUND COMPLETE COMPARISON:  CT renal stone protocol October 30, 2022 FINDINGS: Limited resolution secondary to body habitus. Right Kidney: Renal measurements: 11.8 x 6.3 x 7.5 cm = volume: 292.2 mL. Echogenicity within normal limits. There is suggestion of trace right pelvic fullness/hydronephrosis. Left Kidney: Renal measurements: 12.5 x 6.2 x 4.9 cm = volume: 199.1 mL. Echogenicity within normal limits. No mass or hydronephrosis visualized. Bladder: Bladder is decompressed, with Foley catheter in place. Other: None. IMPRESSION: 1. Trace right hydronephrosis.  Normal left kidney. 2. Bladder is decompressed with Foley catheter in place. Electronically Signed   By: Jacob Moores M.D.   On: 03/19/2023 14:25   DG Chest Portable 1 View  Result Date: 03/19/2023 CLINICAL DATA:  Short of breath. EXAM: PORTABLE CHEST 1 VIEW COMPARISON:  02/24/2023 FINDINGS: Stable cardiac enlargement.  Unchanged asymmetric elevation of right hemidiaphragm. No pleural fluid, interstitial edema or airspace disease. Visualized osseous structures are grossly unremarkable. IMPRESSION: Cardiac enlargement.  No acute cardiopulmonary abnormalities. Electronically Signed   By: Signa Kell M.D.   On: 03/19/2023 05:48    Pending Labs Unresulted Labs (From admission, onward)     Start     Ordered   03/20/23 0500  Comprehensive metabolic panel  Tomorrow morning,   R       Question:  Specimen collection method  Answer:  Lab=Lab collect   03/19/23 0931   03/20/23 0500  CBC  Tomorrow morning,   R       Question:  Specimen collection method  Answer:  Lab=Lab collect   03/19/23 0931   03/19/23 0624  D-dimer, quantitative  Once,   STAT        03/19/23 0623            Vitals/Pain Today's Vitals   03/19/23 1152 03/19/23 1220 03/19/23 1230 03/19/23 1245  BP:  134/65 132/80 (!) 140/91  Pulse: 91 96 96 93  Resp: Temp:      TempSrc:      SpO2: 95% 98% 99% 98%  Weight:      PainSc:        Isolation Precautions No active isolations  Medications Medications  aspirin EC tablet 81 mg (0 mg Oral Hold 03/19/23 1118)  amLODipine (NORVASC) tablet 10 mg (0 mg Oral Hold 03/19/23 1118)  nitroGLYCERIN (NITROSTAT) SL tablet 0.4 mg (has no administration in time range)  rosuvastatin (CRESTOR) tablet 40 mg (0 mg Oral Hold 03/19/23 1120)  DULoxetine (CYMBALTA) DR capsule 120 mg (0 mg Oral Hold 03/19/23 1118)  traZODone (DESYREL) tablet 100 mg (has no administration in time range)  ondansetron (ZOFRAN) tablet 4 mg (has no administration in time range)  pantoprazole (PROTONIX) EC tablet 40 mg (0 mg Oral Hold 03/19/23 1120)  gabapentin (NEURONTIN) capsule 600 mg (0 mg Oral Hold 03/19/23 1119)  rOPINIRole (REQUIP) tablet 1 mg (has no administration in time range)  multivitamin with minerals tablet 1 tablet (1 tablet Oral Not Given 03/19/23 1025)  cholecalciferol (VITAMIN D3) 10 MCG (400 UNIT) tablet  1,000 Units (1,000 Units Oral Not Given 03/19/23 1025)  mometasone-formoterol (DULERA) 200-5 MCG/ACT inhaler 2 puff (2 puffs Inhalation Not Given 03/19/23 1119)  loratadine (CLARITIN) tablet 10 mg (0 mg Oral Hold 03/19/23 1119)  ipratropium-albuterol (DUONEB)  0.5-2.5 (3) MG/3ML nebulizer solution 3 mL (has no administration in time range)  brinzolamide (AZOPT) 1 % ophthalmic suspension 1 drop (1 drop Left Eye Not Given 03/19/23 1129)    And  brimonidine (ALPHAGAN) 0.2 % ophthalmic solution 1 drop (1 drop Left Eye Not Given 03/19/23 1129)  diclofenac Sodium (VOLTAREN) 1 % topical gel 2 g (0 g Topical Hold 03/19/23 1118)  latanoprost (XALATAN) 0.005 % ophthalmic solution 1 drop (1 drop Both Eyes Not Given 03/19/23 1119)  lidocaine (LIDODERM) 5 % 1 patch (1 patch Transdermal Patch Applied 03/19/23 1038)  nystatin (MYCOSTATIN/NYSTOP) topical powder 1 Application (1 Application Topical Not Given 03/19/23 1132)  Netarsudil Dimesylate 0.02 % SOLN 1 drop (0 drops Left Eye Hold 03/19/23 1120)  albuterol (PROVENTIL) (2.5 MG/3ML) 0.083% nebulizer solution 2.5 mg (has no administration in time range)  insulin glargine-yfgn (SEMGLEE) injection 30 Units (30 Units Subcutaneous Given 03/19/23 1221)  insulin aspart (novoLOG) injection 0-20 Units (7 Units Subcutaneous Given 03/19/23 1220)  acetaminophen (OFIRMEV) IV 1,000 mg (0 mg Intravenous Stopped 03/19/23 1213)  acetaminophen (TYLENOL) tablet 325-650 mg (has no administration in time range)  enoxaparin (LOVENOX) injection 80 mg (has no administration in time range)  furosemide (LASIX) injection 80 mg (80 mg Intravenous Given 03/19/23 0608)    Mobility L BKA - prosthetic at bedside.     Focused Assessments NSR   R Recommendations: See Admitting Provider Note  Report given to:   Additional Notes:

## 2023-03-20 DIAGNOSIS — I2510 Atherosclerotic heart disease of native coronary artery without angina pectoris: Secondary | ICD-10-CM | POA: Diagnosis present

## 2023-03-20 DIAGNOSIS — I5032 Chronic diastolic (congestive) heart failure: Secondary | ICD-10-CM | POA: Diagnosis present

## 2023-03-20 DIAGNOSIS — I110 Hypertensive heart disease with heart failure: Secondary | ICD-10-CM | POA: Diagnosis present

## 2023-03-20 DIAGNOSIS — J9622 Acute and chronic respiratory failure with hypercapnia: Secondary | ICD-10-CM

## 2023-03-20 DIAGNOSIS — E1165 Type 2 diabetes mellitus with hyperglycemia: Secondary | ICD-10-CM | POA: Diagnosis present

## 2023-03-20 DIAGNOSIS — Z89431 Acquired absence of right foot: Secondary | ICD-10-CM | POA: Diagnosis not present

## 2023-03-20 DIAGNOSIS — L304 Erythema intertrigo: Secondary | ICD-10-CM | POA: Diagnosis present

## 2023-03-20 DIAGNOSIS — N179 Acute kidney failure, unspecified: Secondary | ICD-10-CM | POA: Diagnosis present

## 2023-03-20 DIAGNOSIS — Z6841 Body Mass Index (BMI) 40.0 and over, adult: Secondary | ICD-10-CM | POA: Diagnosis not present

## 2023-03-20 DIAGNOSIS — E785 Hyperlipidemia, unspecified: Secondary | ICD-10-CM | POA: Diagnosis present

## 2023-03-20 DIAGNOSIS — G2581 Restless legs syndrome: Secondary | ICD-10-CM | POA: Diagnosis present

## 2023-03-20 DIAGNOSIS — Z5901 Sheltered homelessness: Secondary | ICD-10-CM | POA: Diagnosis not present

## 2023-03-20 DIAGNOSIS — N1330 Unspecified hydronephrosis: Secondary | ICD-10-CM | POA: Diagnosis present

## 2023-03-20 DIAGNOSIS — F1490 Cocaine use, unspecified, uncomplicated: Secondary | ICD-10-CM | POA: Diagnosis present

## 2023-03-20 DIAGNOSIS — J4489 Other specified chronic obstructive pulmonary disease: Secondary | ICD-10-CM | POA: Diagnosis present

## 2023-03-20 DIAGNOSIS — G8929 Other chronic pain: Secondary | ICD-10-CM | POA: Diagnosis present

## 2023-03-20 DIAGNOSIS — G4733 Obstructive sleep apnea (adult) (pediatric): Secondary | ICD-10-CM | POA: Diagnosis present

## 2023-03-20 DIAGNOSIS — Z794 Long term (current) use of insulin: Secondary | ICD-10-CM | POA: Diagnosis not present

## 2023-03-20 DIAGNOSIS — R0789 Other chest pain: Secondary | ICD-10-CM | POA: Diagnosis present

## 2023-03-20 DIAGNOSIS — K219 Gastro-esophageal reflux disease without esophagitis: Secondary | ICD-10-CM | POA: Diagnosis present

## 2023-03-20 DIAGNOSIS — Z89512 Acquired absence of left leg below knee: Secondary | ICD-10-CM | POA: Diagnosis not present

## 2023-03-20 DIAGNOSIS — E8770 Fluid overload, unspecified: Secondary | ICD-10-CM | POA: Diagnosis present

## 2023-03-20 DIAGNOSIS — J9611 Chronic respiratory failure with hypoxia: Secondary | ICD-10-CM | POA: Diagnosis present

## 2023-03-20 DIAGNOSIS — E1140 Type 2 diabetes mellitus with diabetic neuropathy, unspecified: Secondary | ICD-10-CM | POA: Diagnosis present

## 2023-03-20 LAB — CBC
HCT: 38.2 % (ref 36.0–46.0)
Hemoglobin: 11.6 g/dL — ABNORMAL LOW (ref 12.0–15.0)
MCH: 27 pg (ref 26.0–34.0)
MCHC: 30.4 g/dL (ref 30.0–36.0)
MCV: 89 fL (ref 80.0–100.0)
Platelets: 325 10*3/uL (ref 150–400)
RBC: 4.29 MIL/uL (ref 3.87–5.11)
RDW: 16.1 % — ABNORMAL HIGH (ref 11.5–15.5)
WBC: 7.2 10*3/uL (ref 4.0–10.5)
nRBC: 0 % (ref 0.0–0.2)

## 2023-03-20 LAB — COMPREHENSIVE METABOLIC PANEL
ALT: 17 U/L (ref 0–44)
AST: 16 U/L (ref 15–41)
Albumin: 2.9 g/dL — ABNORMAL LOW (ref 3.5–5.0)
Alkaline Phosphatase: 94 U/L (ref 38–126)
Anion gap: 12 (ref 5–15)
BUN: 82 mg/dL — ABNORMAL HIGH (ref 6–20)
CO2: 29 mmol/L (ref 22–32)
Calcium: 8.8 mg/dL — ABNORMAL LOW (ref 8.9–10.3)
Chloride: 93 mmol/L — ABNORMAL LOW (ref 98–111)
Creatinine, Ser: 1.53 mg/dL — ABNORMAL HIGH (ref 0.44–1.00)
GFR, Estimated: 43 mL/min — ABNORMAL LOW (ref >60)
Glucose, Bld: 331 mg/dL — ABNORMAL HIGH (ref 70–99)
Potassium: 4.2 mmol/L (ref 3.5–5.1)
Sodium: 134 mmol/L — ABNORMAL LOW (ref 135–145)
Total Bilirubin: 0.6 mg/dL (ref 0.3–1.2)
Total Protein: 7.8 g/dL (ref 6.5–8.1)

## 2023-03-20 LAB — GLUCOSE, CAPILLARY
Glucose-Capillary: 306 mg/dL — ABNORMAL HIGH (ref 70–99)
Glucose-Capillary: 363 mg/dL — ABNORMAL HIGH (ref 70–99)
Glucose-Capillary: 373 mg/dL — ABNORMAL HIGH (ref 70–99)
Glucose-Capillary: 359 mg/dL — ABNORMAL HIGH (ref 70–99)

## 2023-03-20 MED ORDER — FUROSEMIDE 10 MG/ML IJ SOLN
80.0000 mg | Freq: Once | INTRAMUSCULAR | Status: AC
  Administered 2023-03-20: 80 mg via INTRAVENOUS
  Filled 2023-03-20: qty 8

## 2023-03-20 MED ORDER — FUROSEMIDE 10 MG/ML IJ SOLN: 80.0000 mg | Freq: Once | INTRAMUSCULAR | Status: DC

## 2023-03-20 MED ORDER — HYDROCODONE-ACETAMINOPHEN 5-325 MG PO TABS
1.0000 | ORAL_TABLET | Freq: Once | ORAL | Status: AC
  Administered 2023-03-20: 1 via ORAL
  Filled 2023-03-20: qty 1

## 2023-03-20 NOTE — Evaluation (Signed)
Occupational Therapy Evaluation Patient Details Name: [name] MRN: [id number] DOB: 1976-08-02 Today's Date: 03/20/2023   History of Present Illness Pt is a 47 y.o. female who presented 03/19/23 with SOB. Pt found to have acute on chronic hypercapnic respiratory failure and AKI. PMH: recent admission in march for CHF exacerbation, type 2 diabetes, morbid obesity, obesity hypoventilation syndrome, OSA wears BiPAP at night, hypertension, asthma, GERD, hyperlipidemia, restless leg syndrome, left BKA, R transmetatarsal amputation, mood disorder, hypertension, history of chest pain   Clinical Impression   Pt currently with functional limitations due to the deficits listed below (see OT Problem List). Pt reports that she was required to move out of her house recently and has been living in a Motel with her daughter. Prior to admit, pt received assistance from personal aide assistant 7 days a week for BADL tasks. Pt was able to transfer from bed to manual w/c or to [facility] without assistance. Pt will benefit from acute skilled OT to increase their safety and independence with ADL and functional mobility for ADL to facilitate discharge. Recommend follow up HHOT when discharged. Pt will require a new drop arm bariatric BSC and her powered wheelchair to be serviced. OT will continue to follow patient acutely.        Recommendations for follow up therapy are one component of a multi-disciplinary discharge planning process, led by the attending physician.  Recommendations may be updated based on patient status, additional functional criteria and insurance authorization.   Assistance Recommended at Discharge Frequent or constant Supervision/Assistance  Patient can return home with the following A little help with walking and/or transfers;A little help with bathing/dressing/bathroom;Assistance with cooking/housework;Assist for transportation;Help with stairs or ramp for entrance    Functional Status Assessment   Patient has had a recent decline in their functional status and demonstrates the ability to make significant improvements in function in a reasonable and predictable amount of time.  Equipment Recommendations  Other (comment) (Bariatric drop arm BSC)       Precautions / Restrictions Precautions Precautions: Fall;Other (comment) Precaution Comments: L BKA, R transmet, watch SpO2 Required Braces or Orthoses: Other Brace Other Brace: L prothesis in room Restrictions Weight Bearing Restrictions: No      Mobility Bed Mobility Overal bed mobility: Needs Assistance Bed Mobility: Supine to Sit     Supine to sit: Min guard, HOB elevated          Transfers Overall transfer level:  (NT)       Balance Overall balance assessment: Needs assistance Sitting-balance support: No upper extremity supported, Feet supported Sitting balance-Leahy Scale: Normal Sitting balance - Comments: sitting EOB            ADL either performed or assessed with clinical judgement   ADL Overall ADL's : Needs assistance/impaired;At baseline Eating/Feeding: Independent;Sitting   Grooming: Brushing hair;Maximal assistance;Sitting       Lower Body Bathing: Total assistance;+2 for physical assistance;Bed level   Upper Body Dressing : Minimal assistance;Sitting        General ADL Comments: Pt receives assist with all bathing, dressing, and grooming tasks at baseline from personal aide.     Vision Baseline Vision/History: 3 Glaucoma;1 Wears glasses (Does not have glasses. Will be waiting for laser surgery.) Ability to See in Adequate Light: 2 Moderately impaired Patient Visual Report: Blurring of vision Additional Comments: Education provided on using iPhone camera as magnifier to read hospital menu and other smaller print.            Pertinent  Vitals/Pain Pain Assessment Pain Assessment: 0-10 Pain Score: 8  Pain Location: bilateral knees -Knee flexion and extension increases pain Pain  Descriptors / Indicators: Aching, Discomfort     Hand Dominance Right   Extremity/Trunk Assessment Upper Extremity Assessment Upper Extremity Assessment: Generalized weakness;LUE deficits/detail LUE Deficits / Details: Shoulder flexion 75% range, abduction 75%, functional IR/er  General Strength Comments: Overall shoulder strength: 4+/5 all ranges. Able to use UE functionally to complete bed mobility.   Lower Extremity Assessment Lower Extremity Assessment: Defer to PT evaluation   Cervical / Trunk Assessment Cervical / Trunk Assessment: Other exceptions (body habitus)   Communication Communication Communication: No difficulties   Cognition Arousal/Alertness: Awake/alert Behavior During Therapy: WFL for tasks assessed/performed Overall Cognitive Status: Within Functional Limits for tasks assessed        General Comments  Pt with CPAP on upon therapy arrival. Transitioned to 5L of air with nasal cannula per pt's request to participate in therapy evaluation            Home Living Family/patient expects to be discharged to:: Other (Comment) (Motel - since October 2023) Living Arrangements: Children (Daughter - [name]) Available Help at Discharge: Family;Personal care attendant;Available 24 hours/day;Friend(s) (aide x7 days per week, 8 hours M-F, 6 hours S & S) Type of Home: Other(Comment) (Motel)       Home Layout: One level     Bathroom Shower/Tub: Sponge bathes at baseline   [employer]: Standard     Home Equipment: Wheelchair - power;Wheelchair - manual;BSC/3in1;Other (comment);Rolling Walker (2 wheels)   Additional Comments: Power wheelchair no longer working (~August 2023). DABSC one arm won't stay up. Slide board      Prior Functioning/Environment Prior Level of Function : Needs assist       Physical Assist : ADLs (physical);Mobility (physical)   ADLs (physical): Bathing;Dressing;Toileting Mobility Comments: Has left prothesis. needs assist with  placing liner.          OT Problem List: Decreased strength;Impaired UE functional use      OT Treatment/Interventions: Self-care/ADL training;Therapeutic exercise;Neuromuscular education;DME and/or AE instruction;Energy conservation;Manual therapy;Modalities;Therapeutic activities;Patient/family education;Balance training    OT Goals(Current goals can be found in the care plan section) Acute Rehab OT Goals Patient Stated Goal: to sit on EOB OT Goal Formulation: With patient Time For Goal Achievement: 04/03/23 Potential to Achieve Goals: Good  OT Frequency: Min 1X/week    Co-evaluation PT/OT/SLP Co-Evaluation/Treatment: Yes Reason for Co-Treatment: To address functional/ADL transfers;Complexity of the patient's impairments (multi-system involvement)   OT goals addressed during session: Strengthening/ROM;ADL's and self-care      AM-PAC OT "6 Clicks" Daily Activity     Outcome Measure Help from another person eating meals?: None Help from another person taking care of personal grooming?: A Lot Help from another person toileting, which includes using toliet, bedpan, or urinal?: Total Help from another person bathing (including washing, rinsing, drying)?: Total Help from another person to put on and taking off regular upper body clothing?: A Little Help from another person to put on and taking off regular lower body clothing?: Total 6 Click Score: 12   End of Session Equipment Utilized During Treatment: Oxygen Nurse Communication: Patient requests pain meds  Activity Tolerance: Patient tolerated treatment well;Patient limited by pain Patient left: in bed;with call bell/phone within reach  OT Visit Diagnosis: Muscle weakness (generalized) (M62.81)                Time: 0900-1000 OT Time Calculation (min): 60 min Charges:  OT General Charges $OT  Visit: 1 Visit OT Evaluation $OT Eval Moderate Complexity: 1 Mod OT Treatments $Self Care/Home Management : 8-22  mins $Therapeutic Activity: 8-22 mins  Limmie Patricia, OTR/L,CBIS  Supplemental OT - [facility] and [facility] Secure Chat Preferred    Zayvon Alicea, Charisse March 03/20/2023, 12:58 PM

## 2023-03-20 NOTE — Plan of Care (Signed)

## 2023-03-20 NOTE — Evaluation (Signed)
Physical Therapy Evaluation Patient Details Name: [name] MRN: [id number] DOB: [birth year] Today's Date: 03/20/2023  History of Present Illness  Pt is a 47 y.o. female who presented 03/19/23 with SOB. Pt found to have acute on chronic hypercapnic respiratory failure and AKI. PMH: recent admission in march for CHF exacerbation, type 2 diabetes, morbid obesity, obesity hypoventilation syndrome, OSA wears BiPAP at night, hypertension, asthma, GERD, hyperlipidemia, restless leg syndrome, left BKA, R transmetatarsal amputation, mood disorder, hypertension, history of chest pain.    Clinical Impression  Pt in bed upon arrival of PT, agreeable to evaluation at this time. Prior to admission the pt was using her prosthetic to complete transfers to and from bed-manual WC and BSC, and reliant on aide to complete ADLs. The pt reports recent onset of significant pain in her knees, after discussion the onset of her pain aligns with timig of her lift chair breaking. Therefore, suspect pain is due to recent increase in demand on LE to complete transfers. Discussed basic strengthening and ROM exercises pt can complete now, but she will benefit from continued skilled PT to progress functional strength and movement in BLE to reduce pain.      Recommendations for follow up therapy are one component of a multi-disciplinary discharge planning process, led by the attending physician.  Recommendations may be updated based on patient status, additional functional criteria and insurance authorization.  Follow Up Recommendations       Assistance Recommended at Discharge Intermittent Supervision/Assistance  Patient can return home with the following  A little help with walking and/or transfers;A little help with bathing/dressing/bathroom;Assistance with cooking/housework;Assist for transportation;Help with stairs or ramp for entrance    Equipment Recommendations None recommended by PT  Recommendations for Other  Services       Functional Status Assessment Patient has not had a recent decline in their functional status     Precautions / Restrictions Precautions Precautions: Fall;Other (comment) Precaution Comments: L BKA, R transmet, watch SpO2 Required Braces or Orthoses: Other Brace Other Brace: L prothesis in room Restrictions Weight Bearing Restrictions: No      Mobility  Bed Mobility Overal bed mobility: Needs Assistance Bed Mobility: Supine to Sit     Supine to sit: Min guard, HOB elevated     General bed mobility comments: rolling in bed without assist, does use rail. no assist to move LE to EOB but pt asking for [facility] elevated max before she completes movemetn    Transfers Overall transfer level:  (NT)                        Balance Overall balance assessment: Needs assistance Sitting-balance support: No upper extremity supported, Feet supported Sitting balance-Leahy Scale: Normal Sitting balance - Comments: sitting EOB                                     Pertinent Vitals/Pain Pain Assessment Pain Assessment: 0-10 Pain Score: 8  Pain Location: bilateral knees -Knee flexion and extension increases pain Pain Descriptors / Indicators: Aching, Discomfort Pain Intervention(s): Monitored during session, Repositioned, Limited activity within patient's tolerance    Home Living Family/patient expects to be discharged to:: Other (Comment) (Motel - since October 2023) Living Arrangements: Children (Daughter - [name]) Available Help at Discharge: Family;Personal care attendant;Available 24 hours/day;Friend(s) (aide x7 days per week, 8 hours M-F, 6 hours S & S) Type of Home: Other(Comment) (Motel)  Home Layout: One level Home Equipment: Wheelchair - power;Wheelchair - manual;BSC/3in1;Other (comment);Rolling Walker (2 wheels) Additional Comments: Power wheelchair no longer working (~August 2023). DABSC one arm won't stay up. Slide board     Prior Function Prior Level of Function : Needs assist       Physical Assist : ADLs (physical);Mobility (physical)   ADLs (physical): Bathing;Dressing;Toileting Mobility Comments: Has left prothesis. needs assist with placing liner.       Hand Dominance   Dominant Hand: Right    Extremity/Trunk Assessment   Upper Extremity Assessment Upper Extremity Assessment: Defer to OT evaluation LUE Deficits / Details: Shoulder flexion 75% range, abduction 75%, functional IR/er  General Strength Comments: Overall shoulder strength: 4+/5 all ranges. Able to use UE functionally to complete bed mobility.    Lower Extremity Assessment Lower Extremity Assessment: RLE deficits/detail;LLE deficits/detail RLE Deficits / Details: R transmet amputation, reports recent onset significant knee pain. unable to tolerate full extension or passive knee flexion past 80 deg RLE: Unable to fully assess due to pain RLE Sensation: WNL RLE Coordination: WNL LLE Deficits / Details: hx of L BKA    Cervical / Trunk Assessment Cervical / Trunk Assessment: Other exceptions Cervical / Trunk Exceptions: significant body habitus  Communication   Communication: No difficulties  Cognition Arousal/Alertness: Awake/alert Behavior During Therapy: WFL for tasks assessed/performed Overall Cognitive Status: Within Functional Limits for tasks assessed                                          General Comments General comments (skin integrity, edema, etc.): Pt with CPAP on upon therapy arrival. Transitioned to 4L of air with nasal cannula per pt's request to participate in therapy evaluation    Exercises General Exercises - Lower Extremity Quad Sets: AROM, Right, 5 reps (3 sec hold) Long Arc Quad: AROM, Right, 5 reps   Assessment/Plan    PT Assessment All further PT needs can be met in the next venue of care  PT Problem List Decreased strength;Decreased activity tolerance;Decreased balance;Decreased  mobility;Impaired sensation       PT Treatment Interventions      PT Goals (Current goals can be found in the Care Plan section)  Acute Rehab PT Goals Patient Stated Goal: to reduce pain in knees PT Goal Formulation: With patient Time For Goal Achievement: 04/03/23 Potential to Achieve Goals: Good    Frequency       Co-evaluation PT/OT/SLP Co-Evaluation/Treatment: Yes Reason for Co-Treatment: To address functional/ADL transfers;Complexity of the patient's impairments (multi-system involvement) PT goals addressed during session: Mobility/safety with mobility;Balance;Proper use of DME;Strengthening/ROM OT goals addressed during session: Strengthening/ROM;ADL's and self-care       AM-PAC PT "6 Clicks" Mobility  Outcome Measure Help needed turning from your back to your side while in a flat bed without using bedrails?: A Little Help needed moving from lying on your back to sitting on the side of a flat bed without using bedrails?: A Little Help needed moving to and from a bed to a chair (including a wheelchair)?: A Lot Help needed standing up from a chair using your arms (e.g., wheelchair or bedside chair)?: A Lot Help needed to walk in hospital room?: Total Help needed climbing 3-5 steps with a railing? : Total 6 Click Score: 12    End of Session Equipment Utilized During Treatment: Oxygen Activity Tolerance: Patient tolerated treatment well Patient left: in bed;with  call bell/phone within reach Nurse Communication: Mobility status;Patient requests pain meds PT Visit Diagnosis: Muscle weakness (generalized) (M62.81)    Time: 0900-1000 PT Time Calculation (min) (ACUTE ONLY): 60 min   Charges:   PT Evaluation $PT Eval Low Complexity: 1 Low PT Treatments $Therapeutic Exercise: 8-22 mins        Vickki Muff, PT, DPT   Acute Rehabilitation Department Office [phone] Secure Chat Communication Preferred  Ronnie Derby 03/20/2023, 1:14 PM

## 2023-03-20 NOTE — Plan of Care (Signed)

## 2023-03-20 NOTE — Progress Notes (Signed)
     Daily Progress Note Intern Pager: [phone]  Patient name: [name] Medical record number: [id number] Date of birth: May 30, 1976 Age: 47 y.o. Gender: female  Primary Care Provider: Latrelle Dodrill, MD Consultants: None Code Status: FULL  Pt Overview and Major Events to Date:  4/13: Admitted  Assessment and Plan: [name] is a 47 y.o. female presenting with increased leg swelling and shortness of breath.  Improving with diuresis.  Suspect this is likely chronic hypercapnia in the setting of OHS/COPD/OSA.  Believe cocaine use is also contributing.  Differential and plan as below.  * Acute on chronic hypercapnic respiratory failure Stable on BiPAP during sleep. 4L LFNC (baseline) while awake. Breathing is comfortable on BiPAP and breath sounds clear- but limited exam d/t body habitus. Still suspect chronic hypercapnia, likely due to OHS/COPD/OSA. Breathing has improved with diuresis-will plan on additional dose today pending lab work. -Continue BiPAP and supplemental O2 - Redose lasix today -Strict I/O's -AM CBC, CMP  AKI (acute kidney injury) Pending CMP. There is c/f for obstructive pathology based on history in addition to volume overload (trace R hydronephrosis on [place]), has foley in place. Plan for void trial prior to discharge and consider Urology referral. -Place foley and measure output -Follow-up creatinine in a.m. -Encourage p.o.  Type 2 diabetes mellitus with hyperglycemia Home regimen of semglee 60 units BID and novolog 20u TID with meals. Will continue on below management given she is currently ill and titrate accordingly. -Semglee 30u BID -rSSI  Osteoarthritis of right knee Pain with extension from flexion, with mild crepitus on exam, suggestive of OA. R knee XR with marked tricompartmental changes in 09/2022. -PT/OT eval and treat -IV tylenol 1000 mg Q6H until off BiPAP -Norco 5-325 x1  -Voltaren gel   Intertrigo Present around popliteal fossa of  RLE, likely due to retained moisture. -Nystatin powder   FEN/GI: HH Carb modified PPx: Lovenox Dispo:Home pending clinical improvement .  Subjective:  No acute concerns today  Objective: Temp:  [97.7 F (36.5 C)-98.4 F (36.9 C)] 98.3 F (36.8 C) (04/14 0455) Pulse Rate:  [86-100] 94 (04/14 0455) Resp:  [12-19] 14 (04/14 0455) BP: (96-163)/(55-93) 163/87 (04/14 1010) SpO2:  [91 %-99 %] 98 % (04/14 0455) FiO2 (%):  [40 %] 40 % (04/13 2014) Weight:  [159.8 kg-165.1 kg] 165.1 kg (04/14 0455) Physical Exam: General: NAD, chronically ill-appearing Cardiovascular: RRR, no murmurs Respiratory: CTAB but limited by body habitus.  Laboratory: Most recent CBC Lab Results  Component Value Date   WBC 9.6 03/19/2023   HGB 13.3 03/19/2023   HCT 39.0 03/19/2023   MCV 89.1 03/19/2023   PLT 320 03/19/2023   Most recent BMP    Latest Ref Rng & Units 03/19/2023    6:31 AM  BMP  Sodium 135 - 145 mmol/L 133   Potassium 3.5 - 5.1 mmol/L 3.8    Tiffany Kocher, DO 03/20/2023, 10:36 AM  PGY-1, [facility] FPTS Intern pager: [phone], text pages welcome Secure chat group [facility] Teaching Service

## 2023-03-21 ENCOUNTER — Telehealth: Payer: Self-pay

## 2023-03-21 LAB — CBC
HCT: 36.6 % (ref 36.0–46.0)
Hemoglobin: 11.4 g/dL — ABNORMAL LOW (ref 12.0–15.0)
MCH: 27.7 pg (ref 26.0–34.0)
MCHC: 31.1 g/dL (ref 30.0–36.0)
MCV: 89.1 fL (ref 80.0–100.0)
Platelets: 319 10*3/uL (ref 150–400)
RBC: 4.11 MIL/uL (ref 3.87–5.11)
RDW: 15.9 % — ABNORMAL HIGH (ref 11.5–15.5)
WBC: 8.4 10*3/uL (ref 4.0–10.5)
nRBC: 0 % (ref 0.0–0.2)

## 2023-03-21 LAB — COMPREHENSIVE METABOLIC PANEL
ALT: 16 U/L (ref 0–44)
AST: 14 U/L — ABNORMAL LOW (ref 15–41)
Albumin: 2.7 g/dL — ABNORMAL LOW (ref 3.5–5.0)
Alkaline Phosphatase: 105 U/L (ref 38–126)
Anion gap: 11 (ref 5–15)
BUN: 85 mg/dL — ABNORMAL HIGH (ref 6–20)
CO2: 32 mmol/L (ref 22–32)
Calcium: 9.1 mg/dL (ref 8.9–10.3)
Chloride: 91 mmol/L — ABNORMAL LOW (ref 98–111)
Creatinine, Ser: 1.46 mg/dL — ABNORMAL HIGH (ref 0.44–1.00)
GFR, Estimated: 45 mL/min — ABNORMAL LOW (ref >60)
Glucose, Bld: 316 mg/dL — ABNORMAL HIGH (ref 70–99)
Potassium: 4.4 mmol/L (ref 3.5–5.1)
Sodium: 134 mmol/L — ABNORMAL LOW (ref 135–145)
Total Bilirubin: 0.5 mg/dL (ref 0.3–1.2)
Total Protein: 7.8 g/dL (ref 6.5–8.1)

## 2023-03-21 LAB — GLUCOSE, CAPILLARY
Glucose-Capillary: 299 mg/dL — ABNORMAL HIGH (ref 70–99)
Glucose-Capillary: 314 mg/dL — ABNORMAL HIGH (ref 70–99)
Glucose-Capillary: 228 mg/dL — ABNORMAL HIGH (ref 70–99)
Glucose-Capillary: 231 mg/dL — ABNORMAL HIGH (ref 70–99)

## 2023-03-21 LAB — TROPONIN I (HIGH SENSITIVITY): Troponin I (High Sensitivity): 17 ng/L (ref <18)

## 2023-03-21 MED ORDER — METOLAZONE 5 MG PO TABS
2.5000 mg | ORAL_TABLET | Freq: Every day | ORAL | Status: DC
  Administered 2023-03-21 – 2023-03-22 (×2): 2.5 mg via ORAL
  Filled 2023-03-21 (×2): qty 1

## 2023-03-21 MED ORDER — METOLAZONE 2.5 MG PO TABS: 2.5000 mg | ORAL_TABLET | Freq: Every day | ORAL | 0 refills | Status: DC

## 2023-03-21 MED ORDER — LIDOCAINE 5 % EX PTCH
1.0000 | MEDICATED_PATCH | CUTANEOUS | Status: DC
  Administered 2023-03-21: 1 via TRANSDERMAL
  Filled 2023-03-21: qty 1

## 2023-03-21 MED ORDER — HYDROCODONE-ACETAMINOPHEN 5-325 MG PO TABS
1.0000 | ORAL_TABLET | Freq: Once | ORAL | Status: AC
  Administered 2023-03-21: 1 via ORAL
  Filled 2023-03-21: qty 1

## 2023-03-21 MED ORDER — OXYCODONE HCL 5 MG PO TABS
2.5000 mg | ORAL_TABLET | Freq: Once | ORAL | Status: AC
  Administered 2023-03-21: 2.5 mg via ORAL
  Filled 2023-03-21: qty 1

## 2023-03-21 NOTE — Progress Notes (Addendum)
Daily Progress Note Intern Pager: [phone]  Patient name: [name] Medical record number: [id number] Date of birth: 14-Mar-1976 Age: 47 y.o. Gender: female  Primary Care Provider: Latrelle Dodrill, MD Consultants: None Code Status: FULL   Pt Overview and Major Events to Date:  4/13: Admitted   Assessment and Plan: [name] is a 47 y.o. female presenting with increased leg swelling and shortness of breath.  Improving with diuresis.  Suspect this is likely chronic hypercapnia in the setting of OHS/COPD/OSA.  Believe cocaine use is also contributing.  Differential and plan as below.  * Acute on chronic respiratory failure with hypercapnia Stable on RA. Still suspect chronic hypercapnia, likely due to OHS/COPD/OSA. Breathing has improved with diuresis; will plan on altering regimen at d/c. -Continue supplemental O2 as needed -Restart home torsemide 100 mg BID with metolazone 2.5 mg daily -Strict I/O's -AM CBC, CMP  AKI (acute kidney injury) Cr improving 1.53>1.46. There is c/f for obstructive pathology based on history in addition to volume overload (trace R hydronephrosis on [place]), has foley in place. Plan for void trial prior to discharge and consider Urology referral. -Void trial today -Follow-up creatinine in a.m. -Encourage p.o.  Type 2 diabetes mellitus with hyperglycemia CBGs in 300s here in hospital. Home regimen of semglee 60 units BID and novolog 20u TID with meals. Will continue on below management given she is currently ill and titrate accordingly. -Semglee 30u with rSSI -Resume home dosing at discharge  Osteoarthritis of right knee Pain with extension from flexion, with mild crepitus on exam, suggestive of OA. R knee XR with marked tricompartmental changes in 09/2022. -PT/OT eval and treat -IV tylenol 1000 mg Q6H until off BiPAP -Norco 5-325 x2 -Voltaren gel   Intertrigo Present around popliteal fossa of RLE, likely due to retained moisture. -Nystatin  powder -WOC following  FEN/GI: HH Carb modified PPx: Lovenox Dispo:Home today pending adequate void trial  Subjective:  Remains in pain in her lower back and R knee. Feels better from a respiratory standpoint, however, and she states she needs to go home to attend an appointment to get portable oxygen.  Objective: Temp:  [98 F (36.7 C)-98.3 F (36.8 C)] 98.1 F (36.7 C) (04/15 1157) Pulse Rate:  [77-89] 89 (04/15 1157) Resp:  [8-18] 18 (04/15 1157) BP: (127-151)/(74-88) 127/75 (04/15 1157) SpO2:  [91 %-99 %] 99 % (04/15 1157) Weight:  [161.1 kg] 161.1 kg (04/15 0422) Physical Exam: General: Alert and oriented, in NAD Skin: Warm, dry, and intact  HEENT: NCAT, EOM grossly normal, midline nasal septum Cardiac: RRR, no m/r/g appreciated Respiratory: CTAB, breathing and speaking comfortably on RA Abdominal: Soft, nontender, nondistended, normoactive bowel sounds Extremities: RLE TMA, LLE BKA Neurological: No gross focal deficit Psychiatric: Appropriate mood and affect   Laboratory: Most recent CBC Lab Results  Component Value Date   WBC 8.4 03/21/2023   HGB 11.4 (L) 03/21/2023   HCT 36.6 03/21/2023   MCV 89.1 03/21/2023   PLT 319 03/21/2023   Most recent BMP    Latest Ref Rng & Units 03/21/2023    3:09 AM  BMP  Glucose 70 - 99 mg/dL 130   BUN 6 - 20 mg/dL 85   Creatinine 8.65 - 1.00 mg/dL 7.84   Sodium 696 - 295 mmol/L 134   Potassium 3.5 - 5.1 mmol/L 4.4   Chloride 98 - 111 mmol/L 91   CO2 22 - 32 mmol/L 32   Calcium 8.9 - 10.3 mg/dL 9.1  [name], MD 03/21/2023, 1:52 PM PGY-1, [facility] FPTS Intern pager: [phone], text pages welcome Secure chat group [facility] Teaching Service

## 2023-03-21 NOTE — Consult Note (Signed)
WOC Nurse Consult Note: Reason for Consult:full thickness lesion (ruptured boil) in right axillae, also irritant contact dermatitis, specifically erythema intertrigo, to the subpannicular skin fold due to contact with perspiration plus friction.  ICD-10 CM Codes for Irritant Dermatitis L24A9 - Due to friction or contact with other specified body fluids L30.4  - Erythema intertrigo. Also used for abrasion of the hand, chafing of the skin, dermatitis due to sweating and friction, friction dermatitis, friction eczema, and genital/thigh intertrigo.  Wound type:infectious, moisture Pressure Injury POA: Yes, stage 1 to right heel Measurement:To be provided by Bedside RN and documented on Nursing Flow Sheet with application of next dressing Dressing procedure/placement/frequency: Heels are to be floated, turning and repositioning is in place. Topical car to the right axillary lesion is provided using a daily cleanse followed by placement of an absorbant antimicrobial dressing (Aquacel Ag+ [employer] # [id number]). Erythema Intertrigo will be addressed in the subpannicular, but also the inframammary and bilateral inguinal areas with our house moisture wicking antimicrobial textile, [place] # [phone]. Instructions are provided for Nursing but are also here below:  Measure and cut length of InterDry to fit in skin folds that have skin breakdown Tuck InterDry fabric into skin folds in a single layer, allow for 2 inches of overhang from skin edges to allow for wicking to occur May remove to bathe; dry area thoroughly and then tuck into affected areas again Do not apply any creams or ointments when using InterDry DO NOT THROW AWAY FOR 5 DAYS unless soiled with stool DO NOT [facility] product, this will inactivate the silver in the material  New sheet of Interdry should be applied after 5 days of use if patient continues to have skin breakdown     WOC nursing team will not follow, but will remain  available to this patient, the nursing and medical teams.  Please re-consult if needed.  Thank you for inviting [place] to participate in this patient's Plan of Care.  Ladona Mow, MSN, RN, CNS, GNP, Leda Min, [employer], [employer] phone:  [phone]

## 2023-03-21 NOTE — Plan of Care (Signed)

## 2023-03-21 NOTE — Discharge Instructions (Signed)
Dear [name],  Thank you for letting us participate in your care. You were hospitalized for shortness of breath and diagnosed with Acute on chronic respiratory failure with hypercapnia. You were treated with BiPAP, oxygen, and diuresis with improvement. You were also evaluated by PT for your pain in your leg. We have ordered home health PT to come continue helping you with your leg.   POST-HOSPITAL & CARE INSTRUCTIONS Take your new diuresis regimen: torsemide 100 mg BID AND metolazone 2.5 mg daily Continue to use your BiPAP at home Go to your follow up appointments (listed below)  DOCTOR'S APPOINTMENT   Future Appointments  Date Time Provider Department Center  03/22/2023 11:00 AM Candelaria Stagers, DPM [facility]  04/07/2023 11:15 AM Charlott Holler, MD [facility] None  04/11/2023  9:50 AM Latrelle Dodrill, MD [facility]    Follow-up Information     Latrelle Dodrill, MD. Schedule an appointment as soon as possible for a visit.   Specialty: Family Medicine Why: Make an appointment for hospital follow up ASAP. Contact information: [address] [place] [address] [phone]                Take care and be well!  Family Medicine Teaching Service Inpatient Team [place]  [facility]  [address] [place], [place] [address] [phone]

## 2023-03-21 NOTE — Progress Notes (Signed)
Paged by RN the patient was reporting chest pain.  I went to bedside to evaluate the patient.  She was laying flat in the bed pulling herself up using the handlebars on the bed to get more comfortable.  She had received 3 doses of  PRN nitroglycerin without much relief.  She said it was sudden onset pain over the left sided chest wall.  She is reporting some nausea.  Denies any radiation of pain to jaw, arm, shoulder.  Denies any change in shortness of breath.  She asked me what medicine we are going to give her to help her with her chest pain.  Objective: General: Laying in bed, situated her with RNs that she is now sitting upright, no distress CV: Regular rate and rhythm (no tachycardia) Respiratory: Wearing 3 L nasal cannula, breathing comfortably.  Speaks in full sentences. MSK: She is exquisitely tender to palpation over her left chest wall, with rebound when I palpate her left anterior lateral chest wall.    Assessment Overall, vital signs and physical exam reassuring.  However, she does have high risk comorbidities so we will make sure that we rule out an ACS, which I do not suspect at this time. It is likely she has musculoskeletal tenderness in addition to her chronic chest pain.  EKG reviewed at bedside which shows normal sinus rhythm, without any acute ST or T wave changes.  Compared to prior.  No concern for STEMI or NSTEMI.  Also reviewed chart showing history of chronic left-sided chest pain.  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  Plan: -Follow-up on troponins -Trial lidocaine patch for possible MSK etiology of pain -Can have Tylenol as needed for pain, orders already placed -Discussed plan with RN, he is to page if he has any other further concerns

## 2023-03-21 NOTE — Progress Notes (Addendum)
CP, throbbing pain, 7/10, mid chest. Not radiating. No SOB, No diaphoresis. EKG done. PRN Nitro SL given. Paged M. Demeron, DO.  2104- patient said pain is going to the left breast.  [phone]- No relief after 3 doses of Nitrol SL.   0500H - per patient, oxycodone helped with the pain. 0/10. Post void bladder scan =0 ml.    03/21/23 2052  Vitals  Temp 98.1 F (36.7 C)  Temp Source Oral  BP (!) 145/89  MAP (mmHg) 106  BP Location Right Wrist  BP Method Automatic  Patient Position (if appropriate) Lying  Pulse Rate 92  Pulse Rate Source Monitor  ECG Heart Rate 92  Resp 18  MEWS COLOR  MEWS Score Color Green  Oxygen Therapy  SpO2 95 %  O2 Device Nasal Cannula  O2 Flow Rate (L/min) 3 L/min  MEWS Score  MEWS Temp 0  MEWS Systolic 0  MEWS Pulse 0  MEWS RR 0  MEWS LOC 0  MEWS Score 0

## 2023-03-21 NOTE — Discharge Summary (Addendum)
Family Medicine Teaching [facility] Discharge Summary  Patient name: [name] Medical record number: [id number] Date of birth: 1976-04-22 Age: 47 y.o. Gender: female Date of Admission: 03/19/2023  Date of Discharge: 03/21/2023 Admitting Physician: Evette Georges, MD  Primary Care Provider: Latrelle Dodrill, MD Consultants: None  Indication for Hospitalization: Acute on chronic hypercapnic respiratory failure  Brief Hospital Course:  [name] is a 47 y.o. female who was admitted to the [facility] Medicine Teaching Service at [facility] for acute on chronic hypercapnic respiratory failure. Hospital course is outlined below by problem.   Acute on chronic hypercapnic respiratory failure Presented with increased work of breathing and subjective swelling of lower extremities and abdomen.  She was afebrile, not hypoxic with normal oxygen, but did have tachypnea and diminished bilateral breath sounds.  Chest x-ray with cardiomegaly without infiltrate.  VBG significant for 7.29/72/41.  She was given IV Lasix and admitted for further oxygen support on BiPAP.  She was weaned off BiPAP and was satting well on room air by second day of admission. She was restarted on her home torsemide dosing with the addition of metolazone 2.5 mg daily for diuresis at discharge.  AKI Creatinine 1.74 on admission up from baseline of around 1.  Sodium was mildly low at 133.  She reported only 1-2 episodes of urination since last admission with increased torsemide to 100 mg twice daily. Foley was placed with large amount of urine.  Renal ultrasound was collected which demonstrated trace R hydronephrosis. Voiding trial was performed, she was able to urinate well with bladder scans of 0. Suspect AKI due to volume overload. By discharge, Cr remained stable at 1.56.  Intertrigo Presented with intertrigo around popliteal fossa of the right lower extremity and R axilla.  Most likely due to retained moisture.  Home nystatin  powder continued.   Issues for follow up: Ensure improved respiratory status and volume status on new diuretic regimen Recheck BMP for AKI Evaluate effectiveness of nystatin powder for intertrigo Assess R leg pain with PT     Disposition: Home  Discharge Condition: Stable  Discharge Exam:  Blood pressure 126/60, pulse 90, temperature 97.7 F (36.5 C), temperature source Axillary, resp. rate 16, height  (1.575 m), weight (!) 162.2 kg, SpO2 100 %.  General: Alert and oriented, in NAD Skin: Warm, dry, and intact  HEENT: NCAT, EOM grossly normal, midline nasal septum Cardiac/Chest: Regular rate, no m/r/g appreciated, no pain to palpation of chest wall Respiratory: CTAB, breathing and speaking comfortably on RA Abdominal: Soft, nontender, nondistended, normoactive bowel sounds Extremities: RLE TMA, LLE BKA Neurological: No gross focal deficit Psychiatric: Appropriate mood and affect   Significant Labs and Imaging:  Recent Labs  Lab 03/21/23 0309 03/22/23 0114  WBC 8.4 9.1  HGB 11.4* 11.3*  HCT 36.6 34.9*  PLT 319 309   Recent Labs  Lab 03/21/23 0309 03/22/23 0114  NA 134* 135  K 4.4 4.3  CL 91* 98  CO2 32 29  GLUCOSE 316* 229*  BUN 85* 75*  CREATININE 1.46* 1.56*  CALCIUM 9.1 8.9  ALKPHOS 105  --   AST 14*  --   ALT 16  --   ALBUMIN 2.7*  --    US RENAL  Result Date: 03/19/2023 CLINICAL DATA:  Urinary retention EXAM: RENAL / URINARY TRACT ULTRASOUND COMPLETE COMPARISON:  CT renal stone protocol October 30, 2022 FINDINGS: Limited resolution secondary to body habitus. Right Kidney: Renal measurements: 11.8 x 6.3 x 7.5 cm = volume: 292.2  mL. Echogenicity within normal limits. There is suggestion of trace right pelvic fullness/hydronephrosis. Left Kidney: Renal measurements: 12.5 x 6.2 x 4.9 cm = volume: 199.1 mL. Echogenicity within normal limits. No mass or hydronephrosis visualized. Bladder: Bladder is decompressed, with Foley catheter in place. Other: None.  IMPRESSION: 1. Trace right hydronephrosis.  Normal left kidney. 2. Bladder is decompressed with Foley catheter in place. Electronically Signed   By: Jacob Moores M.D.   On: 03/19/2023 14:25   DG Chest Portable 1 View  Result Date: 03/19/2023 CLINICAL DATA:  Short of breath. EXAM: PORTABLE CHEST 1 VIEW COMPARISON:  02/24/2023 FINDINGS: Stable cardiac enlargement. Unchanged asymmetric elevation of right hemidiaphragm. No pleural fluid, interstitial edema or airspace disease. Visualized osseous structures are grossly unremarkable. IMPRESSION: Cardiac enlargement.  No acute cardiopulmonary abnormalities. Electronically Signed   By: Signa Kell M.D.   On: 03/19/2023 05:48     Discharge Medications:  Allergies as of 03/22/2023       Reactions   Cefepime Other (See Comments)   AKI, see records from [facility] hospitalization in January 2020.   Other reaction(s): Other (See Comments) "Shut down organs/kidneys" Note pt has tolerated Rocephin and Keflex Other reaction(s): Not available   Iodine Other (See Comments)   Kidney dysfunction   Kiwi Extract Shortness Of Breath, Swelling, Anaphylaxis   Other reaction(s): Not available   Morphine And Related Nausea And Vomiting   Pentoxifylline Nausea And Vomiting   Trental   Cephalosporins Other (See Comments)   Pt states that they cause her kidneys to shut down   Toradol [ketorolac Tromethamine] Other (See Comments)   Feels like something is crawling on me   Nubain [nalbuphine Hcl] Other (See Comments)   "FEELS LIKE SOMETHING CRAWLING ON ME" Hallucinations         Medication List     TAKE these medications    acetaminophen 325 MG tablet Commonly known as: TYLENOL Take 1-2 tablets (325-650 mg total) by mouth every 4 (four) hours as needed for mild pain.   amLODipine 10 MG tablet Commonly known as: NORVASC Take 1 tablet (10 mg total) by mouth daily.   aspirin EC 81 MG tablet Take 1 tablet (81 mg total) by mouth daily. Swallow whole.    budesonide-formoterol 160-4.5 MCG/ACT inhaler Commonly known as: Symbicort Inhale 2 puffs into the lungs 2 (two) times daily.   cetirizine 10 MG tablet Commonly known as: ZYRTEC Take 1 tablet (10 mg total) by mouth daily as needed for allergies.   Dexcom G6 Sensor Misc Inject 1 applicator into the skin as directed. Change sensor every 10 days.   Dexcom G6 Transmitter Misc Inject 1 Device into the skin as directed. Reuse 8 times with sensor changes.   diclofenac Sodium 1 % Gel Commonly known as: Voltaren Apply 2 g topically 4 (four) times daily.   DULoxetine 60 MG capsule Commonly known as: CYMBALTA Take 2 capsules (120 mg total) by mouth daily.   gabapentin 600 MG tablet Commonly known as: NEURONTIN Start with 1 tablet three times daily, can gradually increase to 2 tablets three times daily   GNP UltiCare Pen Needles 32G X 4 MM Misc Generic drug: Insulin Pen Needle USE TO inject insulin 2 TIMES DAILY   ipratropium-albuterol 0.5-2.5 (3) MG/3ML Soln Commonly known as: DUONEB Take 3 mLs by nebulization every 6 (six) hours as needed. What changed: reasons to take this   latanoprost 0.005 % ophthalmic solution Commonly known as: XALATAN Place 1 drop into both eyes daily.  lidocaine 5 % Commonly known as: LIDODERM Place 1 patch onto the skin daily. Remove & Discard patch within 12 hours or as directed by MD   metolazone 2.5 MG tablet Commonly known as: ZAROXOLYN Take 1 tablet (2.5 mg total) by mouth daily.   nitroGLYCERIN 0.4 MG SL tablet Commonly known as: NITROSTAT Place 1 tablet (0.4 mg total) under the tongue every 5 (five) minutes as needed for chest pain.   NovoLOG FlexPen 100 UNIT/ML FlexPen Generic drug: insulin aspart Inject 20 Units into the skin 3 (three) times daily with meals. What changed: how much to take   nystatin powder Commonly known as: MYCOSTATIN/NYSTOP Apply 1 Application topically 3 (three) times daily.   Omnipod 5 G6 Pods (Gen 5)  Misc Inject into the skin daily.   ondansetron 4 MG tablet Commonly known as: Zofran Take 1 tablet (4 mg total) by mouth every 8 (eight) hours as needed for nausea or vomiting.   One-A-Day Womens tablet Take 1 tablet by mouth daily.   pantoprazole 40 MG tablet Commonly known as: PROTONIX Take 1 tablet (40 mg total) by mouth daily.   ProAir HFA 108 (90 Base) MCG/ACT inhaler Generic drug: albuterol INHALE 1 TO 2 PUFFS BY MOUTH EVERY 6 HOURS AS NEEDED FOR WHEEZING OR SHORTNESS OF BREATH What changed: See the new instructions.   Rhopressa 0.02 % Soln Generic drug: Netarsudil Dimesylate Place 1 drop into the left eye daily.   rOPINIRole 1 MG tablet Commonly known as: REQUIP Take 1 tablet (1 mg total) by mouth at bedtime.   rosuvastatin 40 MG tablet Commonly known as: Crestor Take 1 tablet (40 mg total) by mouth daily.   Simbrinza 1-0.2 % Susp Generic drug: Brinzolamide-Brimonidine Place 1 drop into the left eye in the morning, at noon, and at bedtime.   torsemide 100 MG tablet Commonly known as: DEMADEX Take 1 tablet (100 mg total) by mouth 2 (two) times daily.   traZODone 100 MG tablet Commonly known as: DESYREL TAKE 1 TABLET BY MOUTH AT BEDTIME AS NEEDED FOR SLEEP   Tresiba FlexTouch 200 UNIT/ML FlexTouch Pen Generic drug: insulin degludec Inject 52 Units into the skin 2 (two) times daily. What changed: how much to take   Vitamin D (Cholecalciferol) 25 MCG (1000 UT) Caps Take 1,000 Units by mouth daily.       Discharge Instructions: Please refer to Patient Instructions section of EMR for full details.  Patient was counseled important signs and symptoms that should prompt return to medical care, changes in medications, dietary instructions, activity restrictions, and follow up appointments.   Follow-Up Appointments:  Follow-up Information     Latrelle Dodrill, MD. Schedule an appointment as soon as possible for a visit.   Specialty: Family Medicine Why:  Make an appointment for hospital follow up ASAP. Contact information: [address] [place] [address] [phone]         [facility] Follow up.   Specialty: Rehabilitation Why: Office to call with visit times- if the office has not called within 5 business days please call the office. Contact information: [address] [id number] mc [place] [address] [phone]               [name], MD 03/22/2023, 1:40 PM PGY-1, [facility]   I have evaluated this patient along with Dr. Phineas Real and reviewed the above note, making necessary revisions.  Dorothyann Gibbs, MD 03/22/2023, 2:43 PM PGY-2, [facility]

## 2023-03-21 NOTE — Plan of Care (Signed)
  Problem: Education: Goal: Knowledge of General Education information will improve Description: Including pain rating scale, medication(s)/side effects and non-pharmacologic comfort measures Outcome: Progressing   Problem: Health Behavior/Discharge Planning: Goal: Ability to manage health-related needs will improve Outcome: Progressing   Problem: Clinical Measurements: Goal: Respiratory complications will improve Outcome: Progressing   Problem: Clinical Measurements: Goal: Cardiovascular complication will be avoided Outcome: Progressing   Problem: Pain Managment: Goal: General experience of comfort will improve Outcome: Progressing   Problem: Safety: Goal: Ability to remain free from injury will improve Outcome: Progressing   Problem: Skin Integrity: Goal: Risk for impaired skin integrity will decrease Outcome: Progressing   

## 2023-03-21 NOTE — Inpatient Diabetes Management (Signed)
Inpatient Diabetes Program Recommendations  AACE/ADA: New Consensus Statement on Inpatient Glycemic Control (2015)  Target Ranges:  Prepandial:   less than 140 mg/dL      Peak postprandial:   less than 180 mg/dL (1-2 hours)      Critically ill patients:  140 - 180 mg/dL   Lab Results  Component Value Date   GLUCAP 314 (H) 03/21/2023   HGBA1C 11.0 (H) 11/29/2022    Latest Reference Range & Units 03/20/23 10:08 03/20/23 12:18 03/20/23 15:25 03/20/23 21:27 03/21/23 08:49 03/21/23 12:00  Glucose-Capillary 70 - 99 mg/dL 081 (H) Novolog 15 units 363 (H) Novolog 20 units 373 (H) Novolog 20 units 359 (H) 299 (H) Novolog 11 units 314 (H) Novolog 15 units  (H): Data is abnormally high  Diabetes history: DM2 Outpatient Diabetes medications: Lantus 60 units bid, Novolog 30 units tid meal coverage Current orders for Inpatient glycemic control: Semglee 30 units bid, Novolog 0-20 units tid correction  Inpatient Diabetes Program Recommendations:   Patient has received total 66 units Novolog correction over the past 24 hrs. CBGs consistently >300. Please consider: -Increase Semglee to 45 units bid -Add Novolog 10 units tid meal coverage if eats 50% meal -Add Novolog 0-5 units hs correction  Thank you, Darel Hong E. Brentyn Seehafer, RN, MSN, CDE  Diabetes Coordinator Inpatient Glycemic Control Team Team Pager [phone] (8am-5pm) 03/21/2023 12:55 PM

## 2023-03-21 NOTE — Plan of Care (Signed)

## 2023-03-22 ENCOUNTER — Encounter (HOSPITAL_COMMUNITY): Payer: Self-pay | Admitting: Family Medicine

## 2023-03-22 ENCOUNTER — Ambulatory Visit: Payer: Medicaid Other | Admitting: Podiatry

## 2023-03-22 ENCOUNTER — Other Ambulatory Visit: Payer: Self-pay | Admitting: *Deleted

## 2023-03-22 LAB — CBC WITH DIFFERENTIAL/PLATELET
Abs Immature Granulocytes: 0.05 10*3/uL (ref 0.00–0.07)
Basophils Absolute: 0 10*3/uL (ref 0.0–0.1)
Basophils Relative: 0 %
Eosinophils Absolute: 0.3 10*3/uL (ref 0.0–0.5)
Eosinophils Relative: 3 %
HCT: 34.9 % — ABNORMAL LOW (ref 36.0–46.0)
Hemoglobin: 11.3 g/dL — ABNORMAL LOW (ref 12.0–15.0)
Immature Granulocytes: 1 %
Lymphocytes Relative: 36 %
Lymphs Abs: 3.3 10*3/uL (ref 0.7–4.0)
MCH: 28.2 pg (ref 26.0–34.0)
MCHC: 32.4 g/dL (ref 30.0–36.0)
MCV: 87 fL (ref 80.0–100.0)
Monocytes Absolute: 0.7 10*3/uL (ref 0.1–1.0)
Monocytes Relative: 8 %
Neutro Abs: 4.8 10*3/uL (ref 1.7–7.7)
Neutrophils Relative %: 52 %
Platelets: 309 10*3/uL (ref 150–400)
RBC: 4.01 MIL/uL (ref 3.87–5.11)
RDW: 15.9 % — ABNORMAL HIGH (ref 11.5–15.5)
WBC: 9.1 10*3/uL (ref 4.0–10.5)
nRBC: 0 % (ref 0.0–0.2)

## 2023-03-22 LAB — BASIC METABOLIC PANEL
Anion gap: 8 (ref 5–15)
BUN: 75 mg/dL — ABNORMAL HIGH (ref 6–20)
CO2: 29 mmol/L (ref 22–32)
Calcium: 8.9 mg/dL (ref 8.9–10.3)
Chloride: 98 mmol/L (ref 98–111)
Creatinine, Ser: 1.56 mg/dL — ABNORMAL HIGH (ref 0.44–1.00)
GFR, Estimated: 42 mL/min — ABNORMAL LOW (ref >60)
Glucose, Bld: 229 mg/dL — ABNORMAL HIGH (ref 70–99)
Potassium: 4.3 mmol/L (ref 3.5–5.1)
Sodium: 135 mmol/L (ref 135–145)

## 2023-03-22 LAB — GLUCOSE, CAPILLARY: Glucose-Capillary: 201 mg/dL — ABNORMAL HIGH (ref 70–99)

## 2023-03-22 LAB — TROPONIN I (HIGH SENSITIVITY): Troponin I (High Sensitivity): 18 ng/L — ABNORMAL HIGH (ref <18)

## 2023-03-22 MED ORDER — TORSEMIDE 100 MG PO TABS: 100.0000 mg | ORAL_TABLET | Freq: Two times a day (BID) | ORAL | 3 refills | Status: DC

## 2023-03-22 NOTE — Plan of Care (Signed)
  Problem: Education: Goal: Knowledge of General Education information will improve Description: Including pain rating scale, medication(s)/side effects and non-pharmacologic comfort measures 03/21/2023 2359 by Milon Score, RN Outcome: Progressing 03/21/2023 2359 by Milon Score, RN Outcome: Progressing 03/21/2023 2359 by Milon Score, RN Outcome: Progressing 03/21/2023 2357 by Milon Score, RN Outcome: Progressing   Problem: Health Behavior/Discharge Planning: Goal: Ability to manage health-related needs will improve 03/21/2023 2359 by Milon Score, RN Outcome: Progressing 03/21/2023 2359 by Milon Score, RN Outcome: Progressing 03/21/2023 2359 by Milon Score, RN Outcome: Progressing 03/21/2023 2357 by Milon Score, RN Outcome: Progressing   Problem: Clinical Measurements: Goal: Ability to maintain clinical measurements within normal limits will improve 03/21/2023 2359 by Milon Score, RN Outcome: Progressing 03/21/2023 2359 by Milon Score, RN Outcome: Progressing 03/21/2023 2359 by Milon Score, RN Outcome: Progressing 03/21/2023 2357 by Milon Score, RN Outcome: Progressing Goal: Will remain free from infection 03/21/2023 2359 by Milon Score, RN Outcome: Progressing 03/21/2023 2359 by Milon Score, RN Outcome: Progressing 03/21/2023 2359 by Milon Score, RN Outcome: Progressing 03/21/2023 2357 by Milon Score, RN Outcome: Progressing Goal: Diagnostic test results will improve 03/21/2023 2359 by Milon Score, RN Outcome: Progressing 03/21/2023 2359 by Milon Score, RN Outcome: Progressing 03/21/2023 2359 by Milon Score, RN Outcome: Progressing 03/21/2023 2357 by Milon Score, RN Outcome: Progressing Goal: Respiratory complications will improve 03/21/2023 2359 by Milon Score, RN Outcome: Progressing 03/21/2023 2359 by Milon Score, RN Outcome: Progressing 03/21/2023 2359 by Milon Score, RN Outcome: Progressing 03/21/2023 2357 by Milon Score, RN Outcome: Progressing Goal:  Cardiovascular complication will be avoided 03/21/2023 2359 by Milon Score, RN Outcome: Progressing 03/21/2023 2359 by Milon Score, RN Outcome: Progressing 03/21/2023 2359 by Milon Score, RN Outcome: Progressing 03/21/2023 2357 by Milon Score, RN Outcome: Progressing   Problem: Pain Managment: Goal: General experience of comfort will improve 03/21/2023 2359 by Milon Score, RN Outcome: Progressing 03/21/2023 2359 by Milon Score, RN Outcome: Progressing 03/21/2023 2359 by Milon Score, RN Outcome: Progressing 03/21/2023 2357 by Milon Score, RN Outcome: Progressing   Problem: Safety: Goal: Ability to remain free from injury will improve 03/21/2023 2359 by Milon Score, RN Outcome: Progressing 03/21/2023 2359 by Milon Score, RN Outcome: Progressing 03/21/2023 2359 by Milon Score, RN Outcome: Progressing 03/21/2023 2357 by Milon Score, RN Outcome: Progressing   Problem: Skin Integrity: Goal: Risk for impaired skin integrity will decrease 03/21/2023 2359 by Milon Score, RN Outcome: Progressing 03/21/2023 2359 by Milon Score, RN Outcome: Progressing 03/21/2023 2359 by Milon Score, RN Outcome: Progressing 03/21/2023 2357 by Milon Score, RN Outcome: Progressing

## 2023-03-22 NOTE — Plan of Care (Signed)

## 2023-03-22 NOTE — Progress Notes (Signed)
Occupational Therapy Treatment Patient Details Name: [name] MRN: [id number] DOB: [birth year] Today's Date: 03/22/2023   History of present illness Pt is a 47 y.o. female who presented 03/19/23 with SOB. Pt found to have acute on chronic hypercapnic respiratory failure and AKI. PMH: recent admission in march for CHF exacerbation, type 2 diabetes, morbid obesity, obesity hypoventilation syndrome, OSA wears BiPAP at night, hypertension, asthma, GERD, hyperlipidemia, restless leg syndrome, left BKA, R transmetatarsal amputation, mood disorder, hypertension, history of chest pain   OT comments  Patient scheduled for discharge this date, able to perform a mix between stand and squat pivot to the recliner with setup, securing of lines and supervision.  OT can continue efforts if she remains to address deficits, but given the aide 5-7 times/week, no post acute OT is anticipated.  Given Medicaid status, PT would be a better option for the restricted visits allowed.     Recommendations for follow up therapy are one component of a multi-disciplinary discharge planning process, led by the attending physician.  Recommendations may be updated based on patient status, additional functional criteria and insurance authorization.    Assistance Recommended at Discharge Frequent or constant Supervision/Assistance  Patient can return home with the following  A little help with walking and/or transfers;Assistance with cooking/housework;Assist for transportation;Help with stairs or ramp for entrance;A lot of help with bathing/dressing/bathroom   Equipment Recommendations  None recommended by OT    Recommendations for Other Services      Precautions / Restrictions Precautions Precautions: Fall;Other (comment) Precaution Comments: L BKA, R transmet, watch SpO2 Required Braces or Orthoses: Other Brace Other Brace: L prothesis in room Restrictions Weight Bearing Restrictions: No       Mobility Bed  Mobility   Bed Mobility: Rolling, Supine to Sit Rolling: Modified independent (Device/Increase time)   Supine to sit: Supervision       Patient Response: Cooperative  Transfers Overall transfer level: Needs assistance   Transfers: Sit to/from Stand, Bed to chair/wheelchair/BSC Sit to Stand: Supervision Stand pivot transfers: Supervision Squat pivot transfers: Supervision             Balance Overall balance assessment: Needs assistance Sitting-balance support: No upper extremity supported, Feet supported Sitting balance-Leahy Scale: Normal Sitting balance - Comments: sitting EOB   Standing balance support: No upper extremity supported Standing balance-Leahy Scale: Poor Standing balance comment: needs some type of hand hold                           ADL either performed or assessed with clinical judgement   ADL               Lower Body Bathing: Total assistance;Bed level                              Extremity/Trunk Assessment Upper Extremity Assessment Upper Extremity Assessment: Overall WFL for tasks assessed   Lower Extremity Assessment Lower Extremity Assessment: Defer to PT evaluation   Cervical / Trunk Assessment Cervical / Trunk Assessment: Other exceptions Cervical / Trunk Exceptions: significant body habitus                      Cognition Arousal/Alertness: Awake/alert Behavior During Therapy: WFL for tasks assessed/performed Overall Cognitive Status: Within Functional Limits for tasks assessed  Pertinent Vitals/ Pain       Pain Assessment Pain Assessment: No/denies pain Pain Intervention(s): Monitored during session   Family/patient expects to be discharged to:: Private residence Living Arrangements: Children                                                    Frequency  Min 1X/week         Progress Toward Goals  OT Goals(current goals can now be found in the care plan section)  Progress towards OT goals: Progressing toward goals  Acute Rehab OT Goals OT Goal Formulation: With patient Time For Goal Achievement: 04/03/23 Potential to Achieve Goals: Good  Plan Discharge plan needs to be updated    Co-evaluation                 AM-PAC OT "6 Clicks" Daily Activity     Outcome Measure   Help from another person eating meals?: None Help from another person taking care of personal grooming?: A Little Help from another person toileting, which includes using toliet, bedpan, or urinal?: Total Help from another person bathing (including washing, rinsing, drying)?: A Lot Help from another person to put on and taking off regular upper body clothing?: A Little Help from another person to put on and taking off regular lower body clothing?: A Lot 6 Click Score: 15    End of Session Equipment Utilized During Treatment: Oxygen  OT Visit Diagnosis: Muscle weakness (generalized) (M62.81)   Activity Tolerance Patient tolerated treatment well   Patient Left in chair;with call bell/phone within reach   Nurse Communication Mobility status        Time: 1040-1103 OT Time Calculation (min): 23 min  Charges: OT General Charges $OT Visit: 1 Visit OT Treatments $Self Care/Home Management : 23-37 mins  03/22/2023  RP, OTR/L  Acute Rehabilitation Services  Office:  [phone]   Suzanna Obey 03/22/2023, 11:11 AM

## 2023-03-22 NOTE — Inpatient Diabetes Management (Signed)
Inpatient Diabetes Program Recommendations  AACE/ADA: New Consensus Statement on Inpatient Glycemic Control   Target Ranges:  Prepandial:   less than 140 mg/dL      Peak postprandial:   less than 180 mg/dL (1-2 hours)      Critically ill patients:  140 - 180 mg/dL    Latest Reference Range & Units 03/21/23 08:49 03/21/23 12:00 03/21/23 15:43 03/21/23 21:07 03/22/23 08:17  Glucose-Capillary 70 - 99 mg/dL 161 (H) 096 (H) 045 (H) 231 (H) 201 (H)   Review of Glycemic Control  Diabetes history: DM2 Outpatient Diabetes medications: Lantus 60 units BID, Novolog 30 units TID with meals Current orders for Inpatient glycemic control: Semglee 30 units BID, Novolog 0-20 units TID with meals  Inpatient Diabetes Program Recommendations:    Insulin: Please consider increasing Semglee to 35 units BID, adding Novolog 0-5 units QHS, and ordering Novolog 7 units TID with meals for meal coverage if patient eats at least 50% of meals.  Thanks, Orlando Penner, RN, MSN, CDCES Diabetes Coordinator Inpatient Diabetes Program [phone] (Team Pager from 8am to 5pm)

## 2023-03-22 NOTE — Progress Notes (Signed)
Physical Therapy Treatment Patient Details Name: [name] MRN: [id number] DOB: 04/24/1976 Today's Date: 03/22/2023   History of Present Illness Pt is a 47 y.o. female who presented 03/19/23 with SOB. Pt found to have acute on chronic hypercapnic respiratory failure and AKI. PMH: recent admission in march for CHF exacerbation, type 2 diabetes, morbid obesity, obesity hypoventilation syndrome, OSA wears BiPAP at night, hypertension, asthma, GERD, hyperlipidemia, restless leg syndrome, left BKA, R transmetatarsal amputation, mood disorder, hypertension, history of chest pain    PT Comments    Pt sitting up in recliner on entry, reports she is to go home this afternoon via PTAR. Started seated exercises when RN walked in to start d/c process. PTAR had not been ordered. While RN out of room assisted pt with readjusting L BKA prosthesis to align foot with knee cap. When RN returned pt reports she will have mother take her home on her lunch break. Assisted pt with dressing. Pt feels she will be able to mobilize appropriately when she gets home and has her lift chair and motorized wheelchair fixed this week. Does not want to practice car transfers. Pt reports she will get transportation to her outpatient PT appointments.     Recommendations for follow up therapy are one component of a multi-disciplinary discharge planning process, led by the attending physician.  Recommendations may be updated based on patient status, additional functional criteria and insurance authorization.     Assistance Recommended at Discharge Intermittent Supervision/Assistance  Patient can return home with the following A little help with walking and/or transfers;A little help with bathing/dressing/bathroom;Assistance with cooking/housework;Assist for transportation;Help with stairs or ramp for entrance   Equipment Recommendations  None recommended by PT       Precautions / Restrictions Precautions Precautions: Fall;Other  (comment) Precaution Comments: L BKA, R transmet, watch SpO2 Required Braces or Orthoses: Other Brace Other Brace: L prothesis in room Restrictions Weight Bearing Restrictions: No     Mobility  Transfers                   General transfer comment: sitting up in recliner on entry          Balance Overall balance assessment: Needs assistance Sitting-balance support: No upper extremity supported, Feet supported Sitting balance-Leahy Scale: Normal Sitting balance - Comments: sitting in recliner                                    Cognition Arousal/Alertness: Awake/alert Behavior During Therapy: WFL for tasks assessed/performed Overall Cognitive Status: Within Functional Limits for tasks assessed                                          Exercises General Exercises - Lower Extremity Quad Sets: AROM, Right, 5 reps Long Arc Quad: AROM, Right, 10 reps, Seated Other Exercises Other Exercises: seated quad stretch bilateral x 5 with 10sec hold    General Comments General comments (skin integrity, edema, etc.): Pt on 4L O2 via [place], VSS      Pertinent Vitals/Pain Pain Assessment Pain Assessment: No/denies pain     PT Goals (current goals can now be found in the care plan section) Acute Rehab PT Goals Patient Stated Goal: to reduce pain in knees PT Goal Formulation: With patient Time For Goal Achievement: 04/03/23 Potential to Achieve Goals:  Good Progress towards PT goals: Progressing toward goals       PT Plan Current plan remains appropriate       AM-PAC PT "6 Clicks" Mobility   Outcome Measure  Help needed turning from your back to your side while in a flat bed without using bedrails?: A Little Help needed moving from lying on your back to sitting on the side of a flat bed without using bedrails?: A Little Help needed moving to and from a bed to a chair (including a wheelchair)?: A Lot Help needed standing up from a chair  using your arms (e.g., wheelchair or bedside chair)?: A Lot Help needed to walk in hospital room?: Total Help needed climbing 3-5 steps with a railing? : Total 6 Click Score: 12    End of Session Equipment Utilized During Treatment: Oxygen Activity Tolerance: Patient tolerated treatment well Patient left: with call bell/phone within reach;in chair Nurse Communication: Mobility status;Patient requests pain meds PT Visit Diagnosis: Muscle weakness (generalized) (M62.81)     Time: 1610-9604 PT Time Calculation (min) (ACUTE ONLY): 24 min  Charges:  $Therapeutic Exercise: 8-22 mins                     Britni Driscoll B. Beverely Risen PT, DPT Acute Rehabilitation Services Please use secure chat or  Call Office [phone]    Elon Alas [facility] 03/22/2023, 12:03 PM

## 2023-03-22 NOTE — TOC Initial Note (Addendum)
Transition of Care [facility]) - Initial/Assessment Note    Patient Details  Name: [name] MRN: [id number] Date of Birth: 05/22/1976  [facility] CM/SW Contact:    Gala Lewandowsky, RN Phone Number: 03/22/2023, 10:48 AM  Clinical Narrative: Risk for readmission assessment completed. Patient presented for shortness of breath and lower extremity swelling. PTA patient was at a motel with her daughter @ [facility] [address] [place], [place] [address]. Patient will return to the motel. Patient has used outpatient PT/OT in the past- an ambulatory referral submitted to [facility] for outpatient PT/OT. The office will call the patient. Case Manager discussed wound care with the Staff RN. Staff RN to educate the patients daughter upon arrival regarding wound care of right axillae. Case Manager called [employer] to see if they can assist in wound care supplies. Awaiting call back from [employer].  Patient has DME wheelchair and prosthesis. No further needs identified.                03-22-23 1150 Patient states she can do the wound care herself. Staff RN to provide some supplies for home.  Expected Discharge Plan: OP Rehab Barriers to Discharge: No Barriers Identified   Patient Goals and CMS Choice Patient states their goals for this hospitalization and ongoing recovery are:: patient will return to motel with daughter.   Choice offered to / list presented to : NA      Expected Discharge Plan and Services In-house Referral: NA Discharge Planning Services: CM Consult Post Acute Care Choice: NA Living arrangements for the past 2 months: Hotel/Motel Expected Discharge Date: 03/22/23               DME Arranged: N/A DME Agency: NA       HH Arranged: NA  Prior Living Arrangements/Services Living arrangements for the past 2 months: Hotel/Motel Lives with:: Adult Children Patient language and need for interpreter reviewed:: Yes Do you feel safe going back to the  place where you live?: Yes      Need for Family Participation in Patient Care: Yes (Comment) Care giver support system in place?: Yes (comment) Current home services: DME (transfer board, wheelchair. prosthesis.) Criminal Activity/Legal Involvement Pertinent to Current Situation/Hospitalization: No - Comment as needed  Activities of Daily Living Home Assistive Devices/Equipment: BIPAP, Oxygen, Wheelchair, Cane (specify quad or straight) ADL Screening (condition at time of admission) Patient's cognitive ability adequate to safely complete daily activities?: Yes Is the patient deaf or have difficulty hearing?: No Does the patient have difficulty seeing, even when wearing glasses/contacts?: Yes Does the patient have difficulty concentrating, remembering, or making decisions?: No Patient able to express need for assistance with ADLs?: Yes Does the patient have difficulty dressing or bathing?: Yes Independently performs ADLs?: No Communication: Independent Dressing (OT): Independent Grooming: Independent Feeding: Independent Bathing: Independent Toileting: Independent In/Out Bed: Independent Walks in Home: Independent Does the patient have difficulty walking or climbing stairs?: Yes Weakness of Legs: Right Weakness of Arms/Hands: Both  Permission Sought/Granted Permission sought to share information with : Family Supports, Case Manager    Emotional Assessment Appearance:: Appears stated age Attitude/Demeanor/Rapport: Engaged Affect (typically observed): Appropriate Orientation: : Oriented to Self, Oriented to Place, Oriented to  Time, Oriented to Situation Alcohol / Substance Use: Not Applicable Psych Involvement: No (comment)  Admission diagnosis:  Acute on chronic respiratory failure with hypercapnia [J96.22] Acute hypercapnic respiratory failure [J96.02] Patient Active Problem List   Diagnosis Date Noted   AKI (acute kidney  injury) 03/19/2023   Intertrigo 03/19/2023    Osteoarthritis of right knee 03/19/2023   Acute congestive heart failure 02/22/2023   Acute on chronic respiratory failure with hypercapnia 02/21/2023   Shortness of breath 02/20/2023   Acute exacerbation of CHF (congestive heart failure) 11/28/2022   Cocaine use 11/05/2022   Cellulitis of right lower extremity 06/10/2022   Peripheral artery disease, right leg, mild([name]) 05/15/2022   Osteomyelitis of foot, right, acute 05/14/2022   Wound infection 04/09/2022   Adrenal nodule 01/07/2022   Iritis    (HFpEF) heart failure with preserved ejection fraction 10/19/2021   Impaired mobility 07/07/2021   Chest pain 05/11/2021   Hyperlipidemia associated with type 2 diabetes mellitus 01/15/2021   Hypertension associated with type 2 diabetes mellitus 01/15/2021   Nonobstructive atherosclerosis of coronary artery 11/26/2020   Urinary incontinence 10/23/2020   Chronic respiratory failure with hypoxia 06/14/2020   Constipation 04/03/2020   Left below-knee amputee 04/11/2019   Type 2 diabetes mellitus with hyperglycemia 04/11/2019   S/P transmetatarsal amputation of foot, right 01/25/2019   Allergic rhinitis 03/06/2018   Class 3 severe obesity due to excess calories with serious comorbidity and body mass index (BMI) of 50.0 to 59.9 in adult    Gout of knee, Right 10/22/2016   Vitamin D deficiency 09/05/2015   Recurrent candidiasis of vagina 09/05/2015   Varicose veins of leg with complications 06/11/2015   Restless leg syndrome 10/17/2014   Chronic sinusitis 07/18/2014   Insomnia 08/14/2013   Encounter for chronic pain management 09/01/2011   Obesity hypoventilation syndrome ([name]) 05/22/2008   Mood disorder 05/22/2008   Obstructive sleep apnea (adult) (pediatric) 05/22/2008   Hypertension 05/22/2008   Asthma, chronic 05/22/2008   GERD 05/22/2008   Type 2 diabetes mellitus with diabetic neuropathy, unspecified 05/22/2008   PCP:  Latrelle Dodrill, MD Pharmacy:   [facility]  [place], [place] - [address] [place] [address] Phone: [phone] Fax: [phone]  [name] [address] [place] [address] Phone: [phone] Fax: [phone]  Social Determinants of Health (SDOH) Social History: SDOH Screenings   Food Insecurity: No Food Insecurity (03/22/2023)  Housing: High Risk (03/22/2023)  Transportation Needs: No Transportation Needs (03/22/2023)  Utilities: Not At Risk (03/22/2023)  Alcohol Screen: Low Risk  (10/10/2017)  Depression (PHQ2-9): Medium Risk (03/07/2023)  Financial Resource Strain: High Risk (02/19/2021)  Stress: Stress Concern Present (02/19/2021)  Tobacco Use: Medium Risk (03/22/2023)   Readmission Risk Interventions    03/22/2023   10:47 AM 05/24/2022   11:33 AM  Readmission Risk Prevention Plan  Transportation Screening Complete Complete  Medication Review (RN Care Manager) Complete Complete  PCP or Specialist appointment within 3-5 days of discharge Complete Complete  HRI or Home Care Consult Complete Complete  SW Recovery Care/Counseling Consult Complete Complete  Palliative Care Screening Not Applicable Not Applicable  Skilled Nursing Facility Not Applicable Patient Refused

## 2023-03-22 NOTE — Progress Notes (Signed)
Patient to be discharged home, her mother here to pick her up. Asked if she had additional help to change dressing under her arm. Patient stated she could do it herself and that she was going to take the dressing off when she got home anyway because it was healing. Advised patient to use supplies given to patient upon discharge to continue with wound healing.

## 2023-03-23 ENCOUNTER — Other Ambulatory Visit: Payer: Self-pay | Admitting: Physical Medicine and Rehabilitation

## 2023-03-28 ENCOUNTER — Other Ambulatory Visit: Payer: Self-pay | Admitting: Family Medicine

## 2023-03-28 DIAGNOSIS — E669 Obesity, unspecified: Secondary | ICD-10-CM | POA: Diagnosis not present

## 2023-03-28 DIAGNOSIS — R531 Weakness: Secondary | ICD-10-CM | POA: Diagnosis not present

## 2023-03-28 DIAGNOSIS — Z7409 Other reduced mobility: Secondary | ICD-10-CM | POA: Diagnosis not present

## 2023-03-28 DIAGNOSIS — G4733 Obstructive sleep apnea (adult) (pediatric): Secondary | ICD-10-CM | POA: Diagnosis not present

## 2023-03-28 DIAGNOSIS — J45909 Unspecified asthma, uncomplicated: Secondary | ICD-10-CM | POA: Diagnosis not present

## 2023-04-01 ENCOUNTER — Other Ambulatory Visit (HOSPITAL_COMMUNITY): Payer: Self-pay

## 2023-04-05 NOTE — Transitions of Care (Post Inpatient/ED Visit) (Signed)
   04/05/2023  Name: [name] MRN: [id number] DOB: [birth year]  Today's TOC FU Call Status:    Attempted to reach the patient regarding the most recent Inpatient/ED visit.  Follow Up Plan: No further outreach attempts will be made at this time. We have been unable to contact the patient.  Signature Karena Addison, LPN [facility] Nurse Health Advisor Direct Dial [phone]

## 2023-04-06 DIAGNOSIS — E669 Obesity, unspecified: Secondary | ICD-10-CM | POA: Diagnosis not present

## 2023-04-06 DIAGNOSIS — G4733 Obstructive sleep apnea (adult) (pediatric): Secondary | ICD-10-CM | POA: Diagnosis not present

## 2023-04-06 DIAGNOSIS — Z7409 Other reduced mobility: Secondary | ICD-10-CM | POA: Diagnosis not present

## 2023-04-06 DIAGNOSIS — M86172 Other acute osteomyelitis, left ankle and foot: Secondary | ICD-10-CM | POA: Diagnosis not present

## 2023-04-06 DIAGNOSIS — R531 Weakness: Secondary | ICD-10-CM | POA: Diagnosis not present

## 2023-04-06 DIAGNOSIS — J45909 Unspecified asthma, uncomplicated: Secondary | ICD-10-CM | POA: Diagnosis not present

## 2023-04-07 ENCOUNTER — Ambulatory Visit (INDEPENDENT_AMBULATORY_CARE_PROVIDER_SITE_OTHER): Payer: 59 | Admitting: Internal Medicine

## 2023-04-07 ENCOUNTER — Encounter: Payer: Self-pay | Admitting: Internal Medicine

## 2023-04-07 VITALS — BP 130/82 | HR 96 | Temp 98.2°F | Ht 62.0 in | Wt 347.4 lb

## 2023-04-07 DIAGNOSIS — K219 Gastro-esophageal reflux disease without esophagitis: Secondary | ICD-10-CM

## 2023-04-07 DIAGNOSIS — J9611 Chronic respiratory failure with hypoxia: Secondary | ICD-10-CM | POA: Diagnosis not present

## 2023-04-07 DIAGNOSIS — J398 Other specified diseases of upper respiratory tract: Secondary | ICD-10-CM

## 2023-04-07 DIAGNOSIS — G4733 Obstructive sleep apnea (adult) (pediatric): Secondary | ICD-10-CM

## 2023-04-07 MED ORDER — OMEPRAZOLE 40 MG PO CPDR
40.0000 mg | DELAYED_RELEASE_CAPSULE | Freq: Two times a day (BID) | ORAL | 11 refills | Status: DC
Start: 2023-04-07 — End: 2023-06-11

## 2023-04-07 NOTE — Progress Notes (Signed)
Angelo Caroll    [id number]    14-Apr-1976  Primary Care Physician:McIntyre, Estevan Ryder, MD Date of Appointment: 04/07/2023 Established Patient Visit  Chief complaint:   Chief Complaint  Patient presents with   Follow-up    Been in the hospital x2 since March- patient advises she has good and bad days. Still using Trilogy vent     HPI: [name] is a 47 y.o. woman with super morbid obesity Body mass index is 63.54 kg/m., OSA/OHS and chronic respiratory failure on 3LNC  Interval Updates: Here for follow up for OHS/OSA and chronic respiratory failure. Has been hospitalized three times at [facility] since last fall for respiratory issues, hyperglycemia  Taking symbicort twice daily. She got the nebulizer machine symbicort and duonebs taking nebs once daily.  Feels fluid is well controlled on torsemide 80 mg daily. Weighs herself regularly to monitor for fluid retention.   Trilogy download - requested from [employer]. She says she uses every night and while awake during naps. Download reviewed   Has sudden choking episodes leading to cough upon awakening. Has had a raspy voice ever since her intubation last year.   I have reviewed the patient's family social and past medical history and updated as appropriate.   Past Medical History:  Diagnosis Date   Abscess of groin, left    Acute on chronic diastolic (congestive) heart failure ([name]) 02/05/2022   Acute on chronic respiratory failure with hypoxia ([name]) 04/11/2022   Acute osteomyelitis of ankle or foot, left ([name]) 01/31/2018   Acute respiratory failure with hypoxia and hypercarbia ([name]) 06/15/2020   Acute tubular necrosis ([name]) 07/12/2022   Alveolar hypoventilation    Amputation stump infection ([name]) 04/09/2018   Anemia    not on iron pill   Asthma    Bipolar 2 disorder ([name])    Candidiasis of vagina 05/15/2022   Carpal tunnel syndrome on right    recurrent   Cellulitis 08/2010-08/2011   Chest pain with low risk for  cardiac etiology 05/11/2021   Chronic pain    COPD (chronic obstructive pulmonary disease) ([name])    Symbicort daily and Proventil as needed   Costochondritis    De Quervain's tenosynovitis, bilateral 11/01/2015   Depression    Diabetes mellitus type II, uncontrolled 2000   Type 2, Uncontrolled.Takes Lantus daily.Fasting blood sugar runs 150   Diabetic ulcer of right foot ([name]) 12/19/2021   Drug-seeking behavior    Elevated troponin    Exposure to mold 04/21/2020   GERD (gastroesophageal reflux disease)    takes Pantoprazole and Zantac daily   HLD (hyperlipidemia)    takes Atorvastatin daily   Hypertension    takes Lisinopril and Coreg daily   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  Mild diffuse proximal LAD stenosis with no evidence of obstructive disease (20 to 30% diffuse stenosis);  2.  Left dominant circumflex with widely patent obtuse marginal branches and left PDA branch, no significant stenoses present  3.  Nondominant RCA with mild diffuse nonobstructive stenosis    OSA on CPAP    Peripheral neuropathy    takes Gabapentin daily   Pneumonia    "walking" several yrs ago and as a baby (12/05/2018)  Pneumonia due to COVID-19 virus 04/14/2021   Primary osteoarthritis of first carpometacarpal joint of left hand 07/30/2016   Rectal fissure    Restless leg    Right carpal tunnel syndrome 09/01/2011   SVT (supraventricular tachycardia)    Syncope 02/25/2016   Thrombocytosis 04/11/2022   Urinary frequency    Urinary incontinence 10/23/2020   UTI (urinary tract infection) 04/15/2021   Varicose veins    Right medial thigh and Left leg     Past Surgical History:  Procedure Laterality Date   AMPUTATION Left 02/01/2018    Procedure: LEFT FOURTH AND 5TH TOE RAY AMPUTATION;  Surgeon: Nadara Mustard, MD;  Location: [facility] OR;  Service: Orthopedics;  Laterality: Left;   AMPUTATION Left 03/03/2018   Procedure: LEFT BELOW KNEE AMPUTATION;  Surgeon: Nadara Mustard, MD;  Location: [facility] OR;  Service: Orthopedics;  Laterality: Left;   AMPUTATION Right 05/21/2022   Procedure: REVISION AMPUTATION RAY 1ST;  Surgeon: Candelaria Stagers, DPM;  Location: [facility] OR;  Service: Podiatry;  Laterality: Right;   CARPAL TUNNEL RELEASE Bilateral    CESAREAN SECTION  2007   CORONARY ANGIOGRAPHY N/A 11/26/2020   Procedure: CORONARY ANGIOGRAPHY;  Surgeon: Tonny Bollman, MD;  Location: [facility] INVASIVE CV LAB;  Service: Cardiovascular;  Laterality: N/A;   FOOT AMPUTATION Right    INCISION AND DRAINAGE ABSCESS Left 10/16/2021   Procedure: INCISION AND DRAINAGE ABSCESS;  Surgeon: Fritzi Mandes, MD;  Location: [facility] OR;  Service: General;  Laterality: Left;   INCISION AND DRAINAGE PERIRECTAL ABSCESS Left 05/18/2019   Procedure: IRRIGATION AND DEBRIDEMENT OF PANNIS ABSCESS, POSSIBLE DEBRIDEMENT OF BUTTOCK WOUND;  Surgeon: Manus Rudd, MD;  Location: [facility] OR;  Service: General;  Laterality: Left;   IRRIGATION AND DEBRIDEMENT BUTTOCKS Left 05/17/2019   Procedure: IRRIGATION AND DEBRIDEMENT BUTTOCKS;  Surgeon: Manus Rudd, MD;  Location: [facility] OR;  Service: General;  Laterality: Left;   KNEE ARTHROSCOPY Right 07/17/2010   LEFT HEART CATHETERIZATION WITH CORONARY ANGIOGRAM N/A 07/27/2012   Procedure: LEFT HEART CATHETERIZATION WITH CORONARY ANGIOGRAM;  Surgeon: Tonny Bollman, MD;  Location: [facility] CATH LAB;  Service: Cardiovascular;  Laterality: N/A;   MASS EXCISION N/A 06/29/2013   Procedure:  WIDE LOCAL EXCISION OF POSTERIOR NECK ABSCESS;  Surgeon: Axel Filler, MD;  Location: [facility] OR;  Service: General;  Laterality: N/A;   REPAIR KNEE LIGAMENT Left    "fixed ligaments and chipped patella"   right transmetatarsal amputation      TRACHEOSTOMY TUBE PLACEMENT N/A  07/10/2022   Procedure: CONTROLLED EXTUBATION;  Surgeon: Serena Colonel, MD;  Location: [facility] OR;  Service: ENT;  Laterality: N/A;    Family History  Problem Relation Age of Onset   Diabetes Mother    Hyperlipidemia Mother    Depression Mother    GER disease Mother    Allergic rhinitis Mother    Restless legs syndrome Mother    Heart attack Paternal Uncle    Heart disease Paternal Grandmother    Heart attack Paternal Grandmother    Heart attack Paternal Grandfather    Heart disease Paternal Grandfather    Heart attack Father    Migraines Sister    Cancer Maternal Grandmother        COLON   Hypertension Maternal Grandmother    Hyperlipidemia Maternal Grandmother    Diabetes Maternal Grandmother    Other Maternal Grandfather        GUN SHOT   Anxiety disorder Sister    Asthma Child     Social History  Occupational History   Not on file  Tobacco Use   Smoking status: Former    Packs/day: 0.25    Years: 15.00    Additional pack years: 0.00    Total pack years: 3.75    Types: Cigarettes    Quit date: 12/06/2005    Years since quitting: 17.3   Smokeless tobacco: Never   Tobacco comments:    smokes for a couple of months  Vaping Use   Vaping Use: Never used  Substance and Sexual Activity   Alcohol use: No    Alcohol/week: 0.0 standard drinks of alcohol   Drug use: Not Currently    Comment: OD attempts on home meds     Sexual activity: Yes    Partners: Male     Physical Exam: Blood pressure 130/82, pulse 96, temperature 98.2 F (36.8 C), temperature source Oral, height 5\' 2"  (1.575 m), weight (!) 347 lb 6.4 oz (157.6 kg), SpO2 94 %.  Gen:     NAD, morbidly obese, in wheelchair Lungs:    diminished, clear, no wheeze CV:         diminished, RRR Ext: S/p right metatarsal amputation, left foot prosthetic in place   Data Reviewed: Imaging: I have personally reviewed the CT Chest Jan 2024 negative for PE. There is posterior membrane bowing and tracheobronchomalcia  noted.   PFTs:      No data to display         None on file  Labs: Lab Results  Component Value Date   WBC 9.1 03/22/2023   HGB 11.3 (L) 03/22/2023   HCT 34.9 (L) 03/22/2023   MCV 87.0 03/22/2023   PLT 309 03/22/2023   ABG shows hypercapnia and hypoxemia  Immunization status: Immunization History  Administered Date(s) Administered   Influenza-Unspecified 07/22/2017   PFIZER(Purple Top)SARS-COV-2 Vaccination 06/26/2020, 07/17/2020, 12/18/2020   PNEUMOCOCCAL CONJUGATE-20 05/28/2021   Pfizer Covid-19 Vaccine Bivalent Booster 63yrs & up 03/18/2022   Pneumococcal Polysaccharide-23 09/21/2016   Pneumococcal-Unspecified 05/28/2021   Tdap 05/04/2014, 06/29/2015    Assessment:  Chronic hypercapnic and hypoxemic respiratory failure OHS/OSA Chronic HFpEF Asthma moderate persistent Tracheobronchomalacia  Plan/Recommendations: Continue trilogy home vent. Will review download - since we haven't been able to review this for some time I am placing an order to service her machine.   Continue physical therapy.   Continue symbicort.  Continue duonebs  Discussed TBM diagnosis and counseled on managing symptoms.  I spent 30 minutes in the care of this patient today including pre-charting, chart review, review of results, face-to-face care, coordination of care and communication with consultants etc.).    Return to Care: Return in about 6 months (around 10/08/2023).   Durel Salts, MD Pulmonary and Critical Care Medicine [facility] [phone]

## 2023-04-07 NOTE — Patient Instructions (Signed)
Please schedule follow up scheduled with myself in 6 months.  If my schedule is not open yet, we will contact you with a reminder closer to that time. Please call [phone] if you haven't heard from [place] a month before.   Continue the symbicort with nebulizer treatments.  Follow up with Dr. Pollyann Kennedy about the hoarse voice.   Continue torsemide.   Some of your symptoms of shortness of breath that come up suddenly are related to the collapse of your airways. If you have shortness of breath during the day, try wearing the trilogy vent more, that might help.   Physical therapy will be helpful for your mobility and your breathing.

## 2023-04-11 ENCOUNTER — Ambulatory Visit (INDEPENDENT_AMBULATORY_CARE_PROVIDER_SITE_OTHER): Payer: Medicaid Other | Admitting: Family Medicine

## 2023-04-11 ENCOUNTER — Other Ambulatory Visit: Payer: Self-pay

## 2023-04-11 VITALS — BP 116/73 | HR 89 | Ht 62.0 in | Wt 342.2 lb

## 2023-04-11 DIAGNOSIS — Z7409 Other reduced mobility: Secondary | ICD-10-CM

## 2023-04-11 DIAGNOSIS — Z794 Long term (current) use of insulin: Secondary | ICD-10-CM

## 2023-04-11 DIAGNOSIS — R300 Dysuria: Secondary | ICD-10-CM

## 2023-04-11 DIAGNOSIS — J9611 Chronic respiratory failure with hypoxia: Secondary | ICD-10-CM

## 2023-04-11 DIAGNOSIS — I5032 Chronic diastolic (congestive) heart failure: Secondary | ICD-10-CM | POA: Diagnosis present

## 2023-04-11 DIAGNOSIS — K219 Gastro-esophageal reflux disease without esophagitis: Secondary | ICD-10-CM

## 2023-04-11 DIAGNOSIS — G8929 Other chronic pain: Secondary | ICD-10-CM | POA: Diagnosis not present

## 2023-04-11 DIAGNOSIS — E1140 Type 2 diabetes mellitus with diabetic neuropathy, unspecified: Secondary | ICD-10-CM | POA: Diagnosis not present

## 2023-04-11 LAB — POCT URINALYSIS DIP (MANUAL ENTRY)
Bilirubin, UA: NEGATIVE
Glucose, UA: NEGATIVE mg/dL
Ketones, POC UA: NEGATIVE mg/dL
Nitrite, UA: NEGATIVE
Protein Ur, POC: 100 mg/dL — AB
Spec Grav, UA: 1.015 (ref 1.010–1.025)
Urobilinogen, UA: 0.2 E.U./dL
pH, UA: 7 (ref 5.0–8.0)

## 2023-04-11 NOTE — Patient Instructions (Addendum)
It was great to see you again today.  Checking urine for signs of UTI Checking labs today - kidneys, electrolytes  Try to get lidocaine 4% patch over the counter  Ordering lift chair, bedside commode, walker  Call ortho doctor for an appointment  Follow up with me in 1 month, sooner if needed  Be well, Dr. Pollie Meyer

## 2023-04-11 NOTE — Progress Notes (Unsigned)
  Date of Visit: 04/11/2023   SUBJECTIVE:   HPI:  Kamreigh presents today for routine follow-up.  She has a long list of things to discuss today.  GERD: Reports she was taken off of Prevacid and instead put on Prilosec 40 mg twice daily by pulmonology.  Reports symptoms are well-controlled with this.  Mobility issues: Requests new prescription for lift chair, bariatric bedside commode, and bariatric walker.  Urinary symptoms: Reports dysuria and cloudy urine.  Is urinating less than she normally does.  No fevers.  Wonders if she has a UTI.  HFpEF: Currently taking torsemide 100 mg twice daily and metolazone 2.5 mg daily.  Feels like she is urinating less with this medication, still feels puffy.  Arm locking/shaking: On April 24 had episode where her right hand/wrist locked up.  Unclear how long that lasted.  Also notes that she has had some hand tremors lately.  Chronic pain: Was unable to get lidocaine patches as they are available over-the-counter and Medicaid would not cover the prescription.  She asks about going back on chronic narcotics.   OBJECTIVE:   BP 116/73   Pulse 89   Ht 5\' 2"  (1.575 m)   Wt (!) 342 lb 3.2 oz (155.2 kg)   SpO2 96%   BMI 62.59 kg/m  Gen: No acute distress, pleasant, cooperative HEENT: Normocephalic, atraumatic Heart: Regular rate and rhythm, no murmur Lungs: Clear to auscultation bilaterally, normal effort, speaks in full sentences without distress on 3 L oxygen continuous Neuro: Alert, grossly nonfocal, speech normal.  No significant tremor appreciated in bilateral hands. Ext: Status post left BKA, prosthesis in place.  2+ edema of the right lower extremity.  Full range of motion of right wrist without any signs of muscle spasm or locking.  Grip 5 out of 5 on right.  2+ radial pulse on right.  ASSESSMENT/PLAN:   Impaired mobility I will order bariatric bedside commode, lift chair, and bariatric walker.  Encounter for chronic pain  management Informed patient I am unable to drive her any further chronic narcotics due to her cocaine use. Discussed buying lidocaine over-the-counter.    (HFpEF) heart failure with preserved ejection fraction ([name]) Recent hospitalization for volume overload.  Check BMET today for follow-up.  Volume status does seem up but is very challenging to assess given habitus.  Her weight is not up from prior, it is actually down.  Continue current diuresis at this time.  Chronic respiratory failure with hypoxia ([name]) Currently stable on 3 L oxygen continuously.  GERD Doing well on Prilosec, continue.  Arm locking/shaking Normal exam of arm today.  We could not get into this in great detail due to the multitude of other issues we discussed.  Check BMET for electrolytes, also check mag.  Offered to order x-ray versus having her just see her orthopedic specialist.  She prefers to see Ortho and will call to schedule.  Urinary symptoms Check urinalysis and urine culture today.  Urinalysis has leukocytes but no nitrites.  Await culture.  If positive we will treat with antibiotics.  FOLLOW UP: Follow up in 1 month for above issues  [place] J. Pollie Meyer, MD [facility]

## 2023-04-12 ENCOUNTER — Observation Stay (HOSPITAL_COMMUNITY): Payer: 59

## 2023-04-12 ENCOUNTER — Encounter (HOSPITAL_COMMUNITY): Payer: Self-pay | Admitting: Emergency Medicine

## 2023-04-12 ENCOUNTER — Emergency Department (HOSPITAL_COMMUNITY): Payer: 59

## 2023-04-12 ENCOUNTER — Inpatient Hospital Stay (HOSPITAL_COMMUNITY)
Admission: EM | Admit: 2023-04-12 | Discharge: 2023-04-14 | DRG: 683 | Disposition: A | Discharge status: Home / Self Care | Payer: 59 | Attending: Family Medicine | Admitting: Family Medicine

## 2023-04-12 ENCOUNTER — Telehealth: Payer: Self-pay | Admitting: Family Medicine

## 2023-04-12 ENCOUNTER — Other Ambulatory Visit: Payer: Self-pay

## 2023-04-12 DIAGNOSIS — Z8249 Family history of ischemic heart disease and other diseases of the circulatory system: Secondary | ICD-10-CM

## 2023-04-12 DIAGNOSIS — Z91119 Patient's noncompliance with dietary regimen due to unspecified reason: Secondary | ICD-10-CM

## 2023-04-12 DIAGNOSIS — R300 Dysuria: Secondary | ICD-10-CM | POA: Diagnosis present

## 2023-04-12 DIAGNOSIS — I2510 Atherosclerotic heart disease of native coronary artery without angina pectoris: Secondary | ICD-10-CM | POA: Diagnosis present

## 2023-04-12 DIAGNOSIS — E876 Hypokalemia: Secondary | ICD-10-CM | POA: Diagnosis present

## 2023-04-12 DIAGNOSIS — Z87891 Personal history of nicotine dependence: Secondary | ICD-10-CM

## 2023-04-12 DIAGNOSIS — R739 Hyperglycemia, unspecified: Secondary | ICD-10-CM

## 2023-04-12 DIAGNOSIS — M549 Dorsalgia, unspecified: Secondary | ICD-10-CM | POA: Diagnosis present

## 2023-04-12 DIAGNOSIS — E1122 Type 2 diabetes mellitus with diabetic chronic kidney disease: Secondary | ICD-10-CM | POA: Diagnosis present

## 2023-04-12 DIAGNOSIS — I5032 Chronic diastolic (congestive) heart failure: Secondary | ICD-10-CM | POA: Diagnosis present

## 2023-04-12 DIAGNOSIS — I5033 Acute on chronic diastolic (congestive) heart failure: Secondary | ICD-10-CM | POA: Diagnosis present

## 2023-04-12 DIAGNOSIS — E1165 Type 2 diabetes mellitus with hyperglycemia: Secondary | ICD-10-CM | POA: Diagnosis present

## 2023-04-12 DIAGNOSIS — Z7982 Long term (current) use of aspirin: Secondary | ICD-10-CM

## 2023-04-12 DIAGNOSIS — R0602 Shortness of breath: Secondary | ICD-10-CM | POA: Diagnosis not present

## 2023-04-12 DIAGNOSIS — Z9981 Dependence on supplemental oxygen: Secondary | ICD-10-CM

## 2023-04-12 DIAGNOSIS — Z8349 Family history of other endocrine, nutritional and metabolic diseases: Secondary | ICD-10-CM

## 2023-04-12 DIAGNOSIS — R944 Abnormal results of kidney function studies: Secondary | ICD-10-CM | POA: Diagnosis present

## 2023-04-12 DIAGNOSIS — Z833 Family history of diabetes mellitus: Secondary | ICD-10-CM

## 2023-04-12 DIAGNOSIS — Z79899 Other long term (current) drug therapy: Secondary | ICD-10-CM

## 2023-04-12 DIAGNOSIS — Z8616 Personal history of COVID-19: Secondary | ICD-10-CM

## 2023-04-12 DIAGNOSIS — F3181 Bipolar II disorder: Secondary | ICD-10-CM | POA: Diagnosis present

## 2023-04-12 DIAGNOSIS — N19 Unspecified kidney failure: Secondary | ICD-10-CM | POA: Diagnosis not present

## 2023-04-12 DIAGNOSIS — Z89512 Acquired absence of left leg below knee: Secondary | ICD-10-CM

## 2023-04-12 DIAGNOSIS — R339 Retention of urine, unspecified: Secondary | ICD-10-CM | POA: Diagnosis present

## 2023-04-12 DIAGNOSIS — K219 Gastro-esophageal reflux disease without esophagitis: Secondary | ICD-10-CM | POA: Diagnosis present

## 2023-04-12 DIAGNOSIS — E1140 Type 2 diabetes mellitus with diabetic neuropathy, unspecified: Secondary | ICD-10-CM | POA: Diagnosis present

## 2023-04-12 DIAGNOSIS — R9431 Abnormal electrocardiogram [ECG] [EKG]: Secondary | ICD-10-CM | POA: Diagnosis present

## 2023-04-12 DIAGNOSIS — F1490 Cocaine use, unspecified, uncomplicated: Secondary | ICD-10-CM | POA: Diagnosis present

## 2023-04-12 DIAGNOSIS — J9611 Chronic respiratory failure with hypoxia: Secondary | ICD-10-CM | POA: Diagnosis present

## 2023-04-12 DIAGNOSIS — R519 Headache, unspecified: Secondary | ICD-10-CM | POA: Diagnosis present

## 2023-04-12 DIAGNOSIS — Z83438 Family history of other disorder of lipoprotein metabolism and other lipidemia: Secondary | ICD-10-CM

## 2023-04-12 DIAGNOSIS — G8929 Other chronic pain: Secondary | ICD-10-CM | POA: Diagnosis present

## 2023-04-12 DIAGNOSIS — Z89431 Acquired absence of right foot: Secondary | ICD-10-CM

## 2023-04-12 DIAGNOSIS — E8770 Fluid overload, unspecified: Secondary | ICD-10-CM | POA: Diagnosis present

## 2023-04-12 DIAGNOSIS — Z794 Long term (current) use of insulin: Secondary | ICD-10-CM

## 2023-04-12 DIAGNOSIS — E785 Hyperlipidemia, unspecified: Secondary | ICD-10-CM | POA: Diagnosis present

## 2023-04-12 DIAGNOSIS — E1151 Type 2 diabetes mellitus with diabetic peripheral angiopathy without gangrene: Secondary | ICD-10-CM | POA: Diagnosis present

## 2023-04-12 DIAGNOSIS — J4489 Other specified chronic obstructive pulmonary disease: Secondary | ICD-10-CM | POA: Diagnosis present

## 2023-04-12 DIAGNOSIS — H409 Unspecified glaucoma: Secondary | ICD-10-CM | POA: Diagnosis present

## 2023-04-12 DIAGNOSIS — Z818 Family history of other mental and behavioral disorders: Secondary | ICD-10-CM

## 2023-04-12 DIAGNOSIS — Z7951 Long term (current) use of inhaled steroids: Secondary | ICD-10-CM

## 2023-04-12 DIAGNOSIS — F419 Anxiety disorder, unspecified: Secondary | ICD-10-CM | POA: Diagnosis present

## 2023-04-12 DIAGNOSIS — I959 Hypotension, unspecified: Secondary | ICD-10-CM | POA: Diagnosis present

## 2023-04-12 DIAGNOSIS — G2581 Restless legs syndrome: Secondary | ICD-10-CM | POA: Diagnosis present

## 2023-04-12 DIAGNOSIS — Z8701 Personal history of pneumonia (recurrent): Secondary | ICD-10-CM

## 2023-04-12 DIAGNOSIS — Z825 Family history of asthma and other chronic lower respiratory diseases: Secondary | ICD-10-CM

## 2023-04-12 DIAGNOSIS — E662 Morbid (severe) obesity with alveolar hypoventilation: Secondary | ICD-10-CM | POA: Diagnosis present

## 2023-04-12 DIAGNOSIS — I110 Hypertensive heart disease with heart failure: Secondary | ICD-10-CM | POA: Diagnosis present

## 2023-04-12 HISTORY — DX: Family history of other endocrine, nutritional and metabolic diseases: Z83.49

## 2023-04-12 LAB — BASIC METABOLIC PANEL
BUN/Creatinine Ratio: 80 — ABNORMAL HIGH (ref 9–23)
BUN: 139 mg/dL (ref 6–24)
CO2: 31 mmol/L — ABNORMAL HIGH (ref 20–29)
Calcium: 9.9 mg/dL (ref 8.7–10.2)
Chloride: 80 mmol/L — ABNORMAL LOW (ref 96–106)
Creatinine, Ser: 1.74 mg/dL — ABNORMAL HIGH (ref 0.57–1.00)
Glucose: 393 mg/dL — ABNORMAL HIGH (ref 70–99)
Potassium: 3.7 mmol/L (ref 3.5–5.2)
Sodium: 133 mmol/L — ABNORMAL LOW (ref 134–144)
eGFR: 36 mL/min/{1.73_m2} — ABNORMAL LOW (ref >59)

## 2023-04-12 LAB — I-STAT VENOUS BLOOD GAS, ED
Acid-Base Excess: 11 mmol/L — ABNORMAL HIGH (ref 0.0–2.0)
Bicarbonate: 39.6 mmol/L — ABNORMAL HIGH (ref 20.0–28.0)
Calcium, Ion: 1.15 mmol/L (ref 1.15–1.40)
HCT: 45 % (ref 36.0–46.0)
Hemoglobin: 15.3 g/dL — ABNORMAL HIGH (ref 12.0–15.0)
O2 Saturation: 81 %
Potassium: 3.9 mmol/L (ref 3.5–5.1)
Sodium: 130 mmol/L — ABNORMAL LOW (ref 135–145)
TCO2: 42 mmol/L — ABNORMAL HIGH (ref 22–32)
pCO2, Ven: 67.8 mmHg — ABNORMAL HIGH (ref 44–60)
pH, Ven: 7.375 (ref 7.25–7.43)
pO2, Ven: 49 mmHg — ABNORMAL HIGH (ref 32–45)

## 2023-04-12 LAB — COMPREHENSIVE METABOLIC PANEL
ALT: 29 U/L (ref 0–44)
AST: 29 U/L (ref 15–41)
Albumin: 3.2 g/dL — ABNORMAL LOW (ref 3.5–5.0)
Alkaline Phosphatase: 143 U/L — ABNORMAL HIGH (ref 38–126)
Anion gap: 18 — ABNORMAL HIGH (ref 5–15)
BUN: 137 mg/dL — ABNORMAL HIGH (ref 6–20)
CO2: 31 mmol/L (ref 22–32)
Calcium: 9.2 mg/dL (ref 8.9–10.3)
Chloride: 80 mmol/L — ABNORMAL LOW (ref 98–111)
Creatinine, Ser: 1.67 mg/dL — ABNORMAL HIGH (ref 0.44–1.00)
GFR, Estimated: 38 mL/min — ABNORMAL LOW (ref >60)
Glucose, Bld: 611 mg/dL (ref 70–99)
Potassium: 3 mmol/L — ABNORMAL LOW (ref 3.5–5.1)
Sodium: 129 mmol/L — ABNORMAL LOW (ref 135–145)
Total Bilirubin: 0.4 mg/dL (ref 0.3–1.2)
Total Protein: 8.3 g/dL — ABNORMAL HIGH (ref 6.5–8.1)

## 2023-04-12 LAB — RAPID URINE DRUG SCREEN, HOSP PERFORMED
Amphetamines: NOT DETECTED
Barbiturates: NOT DETECTED
Benzodiazepines: NOT DETECTED
Cocaine: POSITIVE — AB
Opiates: NOT DETECTED
Tetrahydrocannabinol: NOT DETECTED

## 2023-04-12 LAB — URINALYSIS, W/ REFLEX TO CULTURE (INFECTION SUSPECTED)
Bilirubin Urine: NEGATIVE
Glucose, UA: >=500 mg/dL — AB
Ketones, ur: NEGATIVE mg/dL
Nitrite: NEGATIVE
Protein, ur: 30 mg/dL — AB
Specific Gravity, Urine: 1.008 (ref 1.005–1.030)
WBC, UA: >50 WBC/hpf (ref 0–5)
pH: 6 (ref 5.0–8.0)

## 2023-04-12 LAB — CBC
HCT: 38.9 % (ref 36.0–46.0)
Hemoglobin: 12.7 g/dL (ref 12.0–15.0)
MCH: 28.3 pg (ref 26.0–34.0)
MCHC: 32.6 g/dL (ref 30.0–36.0)
MCV: 86.8 fL (ref 80.0–100.0)
Platelets: 235 10*3/uL (ref 150–400)
RBC: 4.48 MIL/uL (ref 3.87–5.11)
RDW: 15.4 % (ref 11.5–15.5)
WBC: 11.5 10*3/uL — ABNORMAL HIGH (ref 4.0–10.5)
nRBC: 0 % (ref 0.0–0.2)

## 2023-04-12 LAB — GLUCOSE, CAPILLARY
Glucose-Capillary: 478 mg/dL — ABNORMAL HIGH (ref 70–99)
Glucose-Capillary: 442 mg/dL — ABNORMAL HIGH (ref 70–99)

## 2023-04-12 LAB — BRAIN NATRIURETIC PEPTIDE: B Natriuretic Peptide: 19.5 pg/mL (ref 0.0–100.0)

## 2023-04-12 LAB — MAGNESIUM
Magnesium: 2.1 mg/dL (ref 1.6–2.3)
Magnesium: 2.4 mg/dL (ref 1.7–2.4)

## 2023-04-12 LAB — TROPONIN I (HIGH SENSITIVITY)
Troponin I (High Sensitivity): 38 ng/L — ABNORMAL HIGH (ref <18)
Troponin I (High Sensitivity): 32 ng/L — ABNORMAL HIGH (ref <18)

## 2023-04-12 LAB — BETA-HYDROXYBUTYRIC ACID: Beta-Hydroxybutyric Acid: 0.14 mmol/L (ref 0.05–0.27)

## 2023-04-12 LAB — CBG MONITORING, ED: Glucose-Capillary: 508 mg/dL (ref 70–99)

## 2023-04-12 MED ORDER — DULOXETINE HCL 60 MG PO CPEP
120.0000 mg | ORAL_CAPSULE | Freq: Every day | ORAL | Status: DC
  Administered 2023-04-12: 120 mg via ORAL
  Filled 2023-04-12: qty 2

## 2023-04-12 MED ORDER — LATANOPROST 0.005 % OP SOLN
1.0000 [drp] | Freq: Every day | OPHTHALMIC | Status: DC
  Administered 2023-04-12 – 2023-04-13 (×2): 1 [drp] via OPHTHALMIC
  Filled 2023-04-12: qty 2.5

## 2023-04-12 MED ORDER — OXYCODONE HCL 5 MG PO TABS
5.0000 mg | ORAL_TABLET | ORAL | Status: DC | PRN
  Administered 2023-04-12 – 2023-04-14 (×5): 5 mg via ORAL
  Filled 2023-04-12 (×6): qty 1

## 2023-04-12 MED ORDER — POTASSIUM CHLORIDE CRYS ER 20 MEQ PO TBCR
40.0000 meq | EXTENDED_RELEASE_TABLET | Freq: Once | ORAL | Status: AC
  Administered 2023-04-12: 40 meq via ORAL
  Filled 2023-04-12: qty 2

## 2023-04-12 MED ORDER — INSULIN ASPART 100 UNIT/ML IJ SOLN
10.0000 [IU] | Freq: Once | INTRAMUSCULAR | Status: AC
  Administered 2023-04-12: 10 [IU] via SUBCUTANEOUS

## 2023-04-12 MED ORDER — LORATADINE 10 MG PO TABS
10.0000 mg | ORAL_TABLET | Freq: Every day | ORAL | Status: DC
  Administered 2023-04-12 – 2023-04-14 (×3): 10 mg via ORAL
  Filled 2023-04-12 (×3): qty 1

## 2023-04-12 MED ORDER — MOMETASONE FURO-FORMOTEROL FUM 200-5 MCG/ACT IN AERO
2.0000 | INHALATION_SPRAY | Freq: Two times a day (BID) | RESPIRATORY_TRACT | Status: DC
  Administered 2023-04-12 – 2023-04-13 (×3): 2 via RESPIRATORY_TRACT
  Filled 2023-04-12: qty 8.8

## 2023-04-12 MED ORDER — INSULIN ASPART 100 UNIT/ML IJ SOLN
0.0000 [IU] | Freq: Three times a day (TID) | INTRAMUSCULAR | Status: DC
  Administered 2023-04-12 – 2023-04-14 (×6): 20 [IU] via SUBCUTANEOUS

## 2023-04-12 MED ORDER — PANTOPRAZOLE SODIUM 40 MG PO TBEC
80.0000 mg | DELAYED_RELEASE_TABLET | Freq: Every day | ORAL | Status: DC
  Administered 2023-04-12 – 2023-04-14 (×3): 80 mg via ORAL
  Filled 2023-04-12 (×3): qty 2

## 2023-04-12 MED ORDER — INSULIN GLARGINE-YFGN 100 UNIT/ML ~~LOC~~ SOLN
30.0000 [IU] | Freq: Two times a day (BID) | SUBCUTANEOUS | Status: DC
  Administered 2023-04-12: 30 [IU] via SUBCUTANEOUS
  Filled 2023-04-12 (×4): qty 0.3

## 2023-04-12 MED ORDER — ALBUTEROL SULFATE HFA 108 (90 BASE) MCG/ACT IN AERS: 2.0000 | INHALATION_SPRAY | Freq: Four times a day (QID) | RESPIRATORY_TRACT | Status: DC | PRN

## 2023-04-12 MED ORDER — INSULIN GLARGINE-YFGN 100 UNIT/ML ~~LOC~~ SOLN
10.0000 [IU] | Freq: Once | SUBCUTANEOUS | Status: AC
  Administered 2023-04-13: 10 [IU] via SUBCUTANEOUS
  Filled 2023-04-12: qty 0.1

## 2023-04-12 MED ORDER — TRAZODONE HCL 50 MG PO TABS
100.0000 mg | ORAL_TABLET | Freq: Every evening | ORAL | Status: DC | PRN
  Administered 2023-04-12 – 2023-04-13 (×2): 100 mg via ORAL
  Filled 2023-04-12 (×2): qty 2

## 2023-04-12 MED ORDER — GABAPENTIN 300 MG PO CAPS
600.0000 mg | ORAL_CAPSULE | Freq: Three times a day (TID) | ORAL | Status: DC
  Administered 2023-04-12 – 2023-04-14 (×5): 600 mg via ORAL
  Filled 2023-04-12 (×6): qty 2

## 2023-04-12 MED ORDER — FUROSEMIDE 10 MG/ML IJ SOLN
80.0000 mg | Freq: Once | INTRAMUSCULAR | Status: AC
  Administered 2023-04-12: 80 mg via INTRAVENOUS
  Filled 2023-04-12: qty 8

## 2023-04-12 MED ORDER — ENOXAPARIN SODIUM 40 MG/0.4ML IJ SOSY
40.0000 mg | PREFILLED_SYRINGE | INTRAMUSCULAR | Status: DC
  Administered 2023-04-12: 40 mg via SUBCUTANEOUS
  Filled 2023-04-12: qty 0.4

## 2023-04-12 MED ORDER — DICLOFENAC SODIUM 1 % EX GEL
2.0000 g | Freq: Four times a day (QID) | CUTANEOUS | Status: DC
  Administered 2023-04-12 (×2): 2 g via TOPICAL
  Filled 2023-04-12 (×2): qty 100

## 2023-04-12 MED ORDER — FUROSEMIDE 10 MG/ML IJ SOLN: 80.0000 mg | Freq: Once | INTRAMUSCULAR | Status: DC

## 2023-04-12 MED ORDER — ROSUVASTATIN CALCIUM 20 MG PO TABS
40.0000 mg | ORAL_TABLET | Freq: Every day | ORAL | Status: DC
  Administered 2023-04-12: 40 mg via ORAL
  Filled 2023-04-12: qty 2

## 2023-04-12 MED ORDER — BRIMONIDINE TARTRATE 0.2 % OP SOLN
1.0000 [drp] | Freq: Three times a day (TID) | OPHTHALMIC | Status: DC
  Administered 2023-04-13 – 2023-04-14 (×4): 1 [drp] via OPHTHALMIC
  Filled 2023-04-12 (×2): qty 5

## 2023-04-12 MED ORDER — ACETAMINOPHEN 325 MG PO TABS: 325.0000 mg | ORAL_TABLET | ORAL | Status: DC | PRN

## 2023-04-12 MED ORDER — INSULIN ASPART 100 UNIT/ML IJ SOLN
0.0000 [IU] | Freq: Every day | INTRAMUSCULAR | Status: DC
  Administered 2023-04-12 – 2023-04-13 (×2): 5 [IU] via SUBCUTANEOUS

## 2023-04-12 MED ORDER — BRINZOLAMIDE 1 % OP SUSP
1.0000 [drp] | Freq: Three times a day (TID) | OPHTHALMIC | Status: DC
  Administered 2023-04-12 – 2023-04-14 (×5): 1 [drp] via OPHTHALMIC
  Filled 2023-04-12: qty 10

## 2023-04-12 MED ORDER — POTASSIUM CHLORIDE 20 MEQ PO PACK
40.0000 meq | PACK | Freq: Once | ORAL | Status: AC
  Administered 2023-04-12: 40 meq via ORAL
  Filled 2023-04-12: qty 2

## 2023-04-12 MED ORDER — ASPIRIN 81 MG PO TBEC
81.0000 mg | DELAYED_RELEASE_TABLET | Freq: Every day | ORAL | Status: DC
  Administered 2023-04-12 – 2023-04-14 (×3): 81 mg via ORAL
  Filled 2023-04-12 (×3): qty 1

## 2023-04-12 MED ORDER — ONDANSETRON HCL 4 MG/2ML IJ SOLN
4.0000 mg | Freq: Once | INTRAMUSCULAR | Status: AC
  Administered 2023-04-12: 4 mg via INTRAVENOUS
  Filled 2023-04-12: qty 2

## 2023-04-12 MED ORDER — NETARSUDIL DIMESYLATE 0.02 % OP SOLN: 1.0000 [drp] | Freq: Every day | OPHTHALMIC | Status: DC

## 2023-04-12 MED ORDER — OXYCODONE-ACETAMINOPHEN 5-325 MG PO TABS
1.0000 | ORAL_TABLET | Freq: Once | ORAL | Status: AC
  Administered 2023-04-12: 1 via ORAL
  Filled 2023-04-12: qty 1

## 2023-04-12 NOTE — Assessment & Plan Note (Signed)
I will order bariatric bedside commode, lift chair, and bariatric walker.

## 2023-04-12 NOTE — ED Notes (Signed)
ED TO INPATIENT HANDOFF REPORT  ED Nurse Name and Phone #: [phone]  S Name/Age/Gender [name] 47 y.o. female Room/Bed: 001C/001C  Code Status   Code Status: Full Code  Home/SNF/Other Home Patient oriented to: self, place, time, and situation Is this baseline? Yes   Triage Complete: Triage complete  Chief Complaint Uremia [N19]  Triage Note Pt to ED via EMS from motel. Pt's doctor called pt and stated that pt's BUN was elevated and advised pt to come to ED. Upon EMS arrival, pt was SOB that has been going on for 6 months. Pt on 3-4L [place] baseline. Pt has hx of DM and pt's CBG was elevated with EMS at 569. Pt states she took insulin right before being transferred to ED.   EMS Vitals: 569 CBG 98% 4L    Allergies Allergies  Allergen Reactions   Cefepime Other (See Comments)    AKI, see records from [facility] hospitalization in January 2020.   Other reaction(s): Other (See Comments) "Shut down organs/kidneys"  Note pt has tolerated Rocephin and Keflex Other reaction(s): Not available   Iodine Other (See Comments)    Kidney dysfunction   Kiwi Extract Shortness Of Breath, Swelling and Anaphylaxis    Other reaction(s): Not available   Morphine And Related Nausea And Vomiting   Pentoxifylline Nausea And Vomiting    Trental   Cephalosporins Other (See Comments)    Pt states that they cause her kidneys to shut down   Toradol [Ketorolac Tromethamine] Other (See Comments)    Feels like something is crawling on me   Nubain [Nalbuphine Hcl] Other (See Comments)    "FEELS LIKE SOMETHING CRAWLING ON ME" Hallucinations     Level of Care/Admitting Diagnosis ED Disposition     ED Disposition  Admit   Condition  --   Comment  Hospital Area: [facility] [[id number]]  Level of Care: Med-Surg [16]  May place patient in observation at [facility] or [facility] if equivalent level of care is available:: No  Covid Evaluation: Asymptomatic - no recent exposure  (last 10 days) testing not required  Diagnosis: Uremia [223110]  Admitting Physician: Tiffany Kocher [[id number]]  Attending Physician: Westley Chandler [[id number]]          B Medical/Surgery History Past Medical History:  Diagnosis Date   Abscess of groin, left    Acute on chronic diastolic (congestive) heart failure ([name]) 02/05/2022   Acute on chronic respiratory failure with hypoxia ([name]) 04/11/2022   Acute osteomyelitis of ankle or foot, left ([name]) 01/31/2018   Acute respiratory failure with hypoxia and hypercarbia ([name]) 06/15/2020   Acute tubular necrosis ([name]) 07/12/2022   Alveolar hypoventilation    Amputation stump infection ([name]) 04/09/2018   Anemia    not on iron pill   Asthma    Bipolar 2 disorder ([name])    Candidiasis of vagina 05/15/2022   Carpal tunnel syndrome on right    recurrent   Cellulitis 08/2010-08/2011   Chest pain with low risk for cardiac etiology 05/11/2021   Chronic pain    COPD (chronic obstructive pulmonary disease) ([name])    Symbicort daily and Proventil as needed   Costochondritis    De Quervain's tenosynovitis, bilateral 11/01/2015   Depression    Diabetes mellitus type II, uncontrolled 2000   Type 2, Uncontrolled.Takes Lantus daily.Fasting blood sugar runs 150   Diabetic ulcer of right foot ([name]) 12/19/2021   Drug-seeking behavior    Elevated troponin    Exposure to mold  04/21/2020   GERD (gastroesophageal reflux disease)    takes Pantoprazole and Zantac daily   HLD (hyperlipidemia)    takes Atorvastatin daily   Hypertension    takes Lisinopril and Coreg daily   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  Mild diffuse proximal LAD stenosis with no evidence of obstructive disease (20 to  30% diffuse stenosis);  2.  Left dominant circumflex with widely patent obtuse marginal branches and left PDA branch, no significant stenoses present  3.  Nondominant RCA with mild diffuse nonobstructive stenosis    OSA on CPAP    Peripheral neuropathy    takes Gabapentin daily   Pneumonia    "walking" several yrs ago and as a baby (12/05/2018)   Pneumonia due to COVID-19 virus 04/14/2021   Primary osteoarthritis of first carpometacarpal joint of left hand 07/30/2016   Rectal fissure    Restless leg    Right carpal tunnel syndrome 09/01/2011   SVT (supraventricular tachycardia)    Syncope 02/25/2016   Thrombocytosis 04/11/2022   Urinary frequency    Urinary incontinence 10/23/2020   UTI (urinary tract infection) 04/15/2021   Varicose veins    Right medial thigh and Left leg    Past Surgical History:  Procedure Laterality Date   AMPUTATION Left 02/01/2018   Procedure: LEFT FOURTH AND 5TH TOE RAY AMPUTATION;  Surgeon: Nadara Mustard, MD;  Location: [facility] OR;  Service: Orthopedics;  Laterality: Left;   AMPUTATION Left 03/03/2018   Procedure: LEFT BELOW KNEE AMPUTATION;  Surgeon: Nadara Mustard, MD;  Location: [facility] OR;  Service: Orthopedics;  Laterality: Left;   AMPUTATION Right 05/21/2022   Procedure: REVISION AMPUTATION RAY 1ST;  Surgeon: Candelaria Stagers, DPM;  Location: [facility] OR;  Service: Podiatry;  Laterality: Right;   CARPAL TUNNEL RELEASE Bilateral    CESAREAN SECTION  2007   CORONARY ANGIOGRAPHY N/A 11/26/2020   Procedure: CORONARY ANGIOGRAPHY;  Surgeon: Tonny Bollman, MD;  Location: [facility] INVASIVE CV LAB;  Service: Cardiovascular;  Laterality: N/A;   FOOT AMPUTATION Right    INCISION AND DRAINAGE ABSCESS Left 10/16/2021   Procedure: INCISION AND DRAINAGE ABSCESS;  Surgeon: Fritzi Mandes, MD;  Location: [facility] OR;  Service: General;  Laterality: Left;   INCISION AND DRAINAGE PERIRECTAL ABSCESS Left 05/18/2019   Procedure: IRRIGATION AND DEBRIDEMENT OF PANNIS ABSCESS, POSSIBLE DEBRIDEMENT  OF BUTTOCK WOUND;  Surgeon: Manus Rudd, MD;  Location: [facility] OR;  Service: General;  Laterality: Left;   IRRIGATION AND DEBRIDEMENT BUTTOCKS Left 05/17/2019   Procedure: IRRIGATION AND DEBRIDEMENT BUTTOCKS;  Surgeon: Manus Rudd, MD;  Location: [facility] OR;  Service: General;  Laterality: Left;   KNEE ARTHROSCOPY Right 07/17/2010   LEFT HEART CATHETERIZATION WITH CORONARY ANGIOGRAM N/A 07/27/2012   Procedure: LEFT HEART CATHETERIZATION WITH CORONARY ANGIOGRAM;  Surgeon: Tonny Bollman, MD;  Location: [facility] CATH LAB;  Service: Cardiovascular;  Laterality: N/A;   MASS EXCISION N/A 06/29/2013   Procedure:  WIDE LOCAL EXCISION OF POSTERIOR NECK ABSCESS;  Surgeon: Axel Filler, MD;  Location: [facility] OR;  Service: General;  Laterality: N/A;   REPAIR KNEE LIGAMENT Left    "fixed ligaments and chipped patella"   right transmetatarsal amputation      TRACHEOSTOMY TUBE PLACEMENT N/A 07/10/2022   Procedure: CONTROLLED  EXTUBATION;  Surgeon: Serena Colonel, MD;  Location: [facility] OR;  Service: ENT;  Laterality: N/A;     A IV Location/Drains/Wounds Patient Lines/Drains/Airways Status     Active Line/Drains/Airways     Name Placement date Placement time Site Days   Peripheral IV 04/12/23 20 G 1.88" Left;Anterior Forearm 04/12/23  1523  Forearm  less than 1   Pressure Injury 03/19/23 Heel Right Stage 1 -  Intact skin with non-blanchable redness of a localized area usually over a bony prominence. 03/19/23  1745  -- 24   Wound / Incision (Open or Dehisced) 03/19/23 (ITD) Intertriginous Dermatitis Abdomen Lower;Bilateral 03/19/23  1630  Abdomen  24   Wound / Incision (Open or Dehisced) 03/19/23 Non-pressure wound Axilla Right per patient report: ruptured boil 03/19/23  1630  Axilla  24            Intake/Output Last 24 hours No intake or output data in the 24 hours ending 04/12/23 1655  Labs/Imaging Results for orders placed or performed during the hospital encounter of 04/12/23 (from the past 48 hour(s))  CBC      Status: Abnormal   Collection Time: 04/12/23 12:31 PM  Result Value Ref Range   WBC 11.5 (H) 4.0 - 10.5 K/uL   RBC 4.48 3.87 - 5.11 MIL/uL   Hemoglobin 12.7 12.0 - 15.0 g/dL   HCT 16.1 09.6 - 04.5 %   MCV 86.8 80.0 - 100.0 fL   MCH 28.3 26.0 - 34.0 pg   MCHC 32.6 30.0 - 36.0 g/dL   RDW 40.9 81.1 - 91.4 %   Platelets 235 150 - 400 K/uL   nRBC 0.0 0.0 - 0.2 %    Comment: Performed at [facility] Lab, [address] [place] [address] [place], [place] [address]  Comprehensive metabolic panel     Status: Abnormal   Collection Time: 04/12/23 12:31 PM  Result Value Ref Range   Sodium 129 (L) 135 - 145 mmol/L   Potassium 3.0 (L) 3.5 - 5.1 mmol/L   Chloride 80 (L) 98 - 111 mmol/L   CO2 31 22 - 32 mmol/L   Glucose, Bld 611 (HH) 70 - 99 mg/dL    Comment: CRITICAL RESULT CALLED TO, READ BACK BY AND VERIFIED WITH MCIVER,M RN @ 1359 04/12/23 LEONARD,A Glucose reference range applies only to samples taken after fasting for at least 8 hours.    BUN 137 (H) 6 - 20 mg/dL   Creatinine, Ser 6.21 (H) 0.44 - 1.00 mg/dL   Calcium 9.2 8.9 - 30.8 mg/dL   Total Protein 8.3 (H) 6.5 - 8.1 g/dL   Albumin 3.2 (L) 3.5 - 5.0 g/dL   AST 29 15 - 41 U/L   ALT 29 0 - 44 U/L   Alkaline Phosphatase 143 (H) 38 - 126 U/L   Total Bilirubin 0.4 0.3 - 1.2 mg/dL   GFR, Estimated 38 (L) >60 mL/min    Comment: (NOTE) Calculated using the CKD-EPI Creatinine Equation (2021)    Anion gap 18 (H) 5 - 15    Comment: Performed at [facility] Lab, [address], [place], [place] [address]  Troponin I (High Sensitivity)     Status: Abnormal   Collection Time: 04/12/23 12:31 PM  Result Value Ref Range   Troponin I (High Sensitivity) 38 (H) <18 ng/L    Comment: (NOTE) Elevated high sensitivity troponin I (hsTnI) values and significant  changes across serial measurements may suggest ACS but many other  chronic and acute conditions are known  to elevate hsTnI results.  Refer to the "Links" section for chest pain algorithms and additional   guidance. Performed at [facility], [address] [place], [place] [address]   Brain natriuretic peptide     Status: None   Collection Time: 04/12/23 12:31 PM  Result Value Ref Range   B Natriuretic Peptide 19.5 0.0 - 100.0 pg/mL    Comment: Performed at [facility] Lab, [address], [place], [place] [address]  Beta-hydroxybutyric acid     Status: None   Collection Time: 04/12/23 12:31 PM  Result Value Ref Range   Beta-Hydroxybutyric Acid 0.14 0.05 - 0.27 mmol/L    Comment: Performed at [facility] Lab, [address] [place], [place] [address]  Urinalysis, w/ Reflex to Culture (Infection Suspected) -Urine, Clean Catch     Status: Abnormal   Collection Time: 04/12/23  1:17 PM  Result Value Ref Range   Specimen Source URINE, CLEAN CATCH    Color, Urine YELLOW YELLOW   APPearance CLOUDY (A) CLEAR   Specific Gravity, Urine 1.008 1.005 - 1.030   pH 6.0 5.0 - 8.0   Glucose, UA >=500 (A) NEGATIVE mg/dL   Hgb urine dipstick SMALL (A) NEGATIVE   Bilirubin Urine NEGATIVE NEGATIVE   Ketones, ur NEGATIVE NEGATIVE mg/dL   Protein, ur 30 (A) NEGATIVE mg/dL   Nitrite NEGATIVE NEGATIVE   Leukocytes,Ua LARGE (A) NEGATIVE   RBC / HPF 6-10 0 - 5 RBC/hpf   WBC, UA >50 0 - 5 WBC/hpf    Comment:        Reflex urine culture not performed if WBC <=10, OR if Squamous epithelial cells >5. If Squamous epithelial cells >5 suggest recollection.    Bacteria, UA RARE (A) NONE SEEN   Squamous Epithelial / HPF 0-5 0 - 5 /HPF   WBC Clumps PRESENT     Comment: Performed at [facility], [address], [place], [place] [address]  Urine rapid drug screen (hosp performed)     Status: Abnormal   Collection Time: 04/12/23  3:04 PM  Result Value Ref Range   Opiates NONE DETECTED NONE DETECTED   Cocaine POSITIVE (A) NONE DETECTED   Benzodiazepines NONE DETECTED NONE DETECTED   Amphetamines NONE DETECTED NONE DETECTED   Tetrahydrocannabinol NONE DETECTED NONE DETECTED    Barbiturates NONE DETECTED NONE DETECTED    Comment: (NOTE) DRUG SCREEN FOR MEDICAL PURPOSES ONLY.  IF CONFIRMATION IS NEEDED FOR ANY PURPOSE, NOTIFY LAB WITHIN 5 DAYS.  LOWEST DETECTABLE LIMITS FOR URINE DRUG SCREEN Drug Class                     Cutoff (ng/mL) Amphetamine and metabolites    1000 Barbiturate and metabolites    200 Benzodiazepine                 200 Opiates and metabolites        300 Cocaine and metabolites        300 THC                            50 Performed at [facility] Lab, [address], [place], [place] [address]   I-Stat venous blood gas, [facility], [facility], [facility])     Status: Abnormal   Collection Time: 04/12/23  3:22 PM  Result Value Ref Range   pH, Ven 7.375 7.25 - 7.43   pCO2, Ven 67.8 (  H) 44 - 60 mmHg   pO2, Ven 49 (H) 32 - 45 mmHg   Bicarbonate 39.6 (H) 20.0 - 28.0 mmol/L   TCO2 42 (H) 22 - 32 mmol/L   O2 Saturation 81 %   Acid-Base Excess 11.0 (H) 0.0 - 2.0 mmol/L   Sodium 130 (L) 135 - 145 mmol/L   Potassium 3.9 3.5 - 5.1 mmol/L   Calcium, Ion 1.15 1.15 - 1.40 mmol/L   HCT 45.0 36.0 - 46.0 %   Hemoglobin 15.3 (H) 12.0 - 15.0 g/dL   Sample type VENOUS    Comment NOTIFIED PHYSICIAN   CBG monitoring, ED     Status: Abnormal   Collection Time: 04/12/23  3:23 PM  Result Value Ref Range   Glucose-Capillary 508 (HH) 70 - 99 mg/dL    Comment: Glucose reference range applies only to samples taken after fasting for at least 8 hours.   *Note: Due to a large number of results and/or encounters for the requested time period, some results have not been displayed. A complete set of results can be found in Results Review.   DG Chest 2 View  Result Date: 04/12/2023 CLINICAL DATA:  Six-month history of shortness of breath EXAM: CHEST - 2 VIEW COMPARISON:  Chest radiograph dated 03/19/2023 FINDINGS: Normal lung volumes. No focal consolidations. No pleural effusion or pneumothorax. The heart size and mediastinal contours are within normal limits. No acute  osseous abnormality. IMPRESSION: No active cardiopulmonary disease. Electronically Signed   By: Agustin Cree M.D.   On: 04/12/2023 13:07    Pending Labs Unresulted Labs (From admission, onward)     Start     Ordered   04/13/23 0500  Basic metabolic panel  Tomorrow morning,   R        04/12/23 1629   04/13/23 0500  CBC  Tomorrow morning,   R        04/12/23 1629   04/12/23 1338  Magnesium  Add-on,   AD        04/12/23 1337   04/12/23 1317  Urine Culture  Once,   R        04/12/23 1317            Vitals/Pain Today's Vitals   04/12/23 1314 04/12/23 1315 04/12/23 1400 04/12/23 1426  BP: (!) 142/86 (!) 142/90 (!) 162/85   Pulse: 96 94 97   Resp: 16 (!) 22 16   Temp: 98.2 F (36.8 C)     TempSrc:      SpO2: 98% 99% 97%   PainSc:    6     Isolation Precautions No active isolations  Medications Medications  aspirin EC tablet 81 mg (has no administration in time range)  acetaminophen (TYLENOL) tablet 325-650 mg (has no administration in time range)  rosuvastatin (CRESTOR) tablet 40 mg (has no administration in time range)  DULoxetine (CYMBALTA) DR capsule 120 mg (has no administration in time range)  traZODone (DESYREL) tablet 100 mg (has no administration in time range)  pantoprazole (PROTONIX) EC tablet 80 mg (has no administration in time range)  albuterol (VENTOLIN HFA) 108 (90 Base) MCG/ACT inhaler 2 puff (has no administration in time range)  mometasone-formoterol (DULERA) 200-5 MCG/ACT inhaler 2 puff (has no administration in time range)  loratadine (CLARITIN) tablet 10 mg (has no administration in time range)  diclofenac Sodium (VOLTAREN) 1 % topical gel 2 g (has no administration in time range)  latanoprost (XALATAN) 0.005 % ophthalmic solution 1 drop (has no administration  in time range)  brinzolamide (AZOPT) 1 % ophthalmic suspension 1 drop (has no administration in time range)  Netarsudil Dimesylate 0.02 % SOLN 1 drop (has no administration in time range)   enoxaparin (LOVENOX) injection 40 mg (has no administration in time range)  oxyCODONE (Oxy IR/ROXICODONE) immediate release tablet 5 mg (has no administration in time range)  insulin glargine-yfgn (SEMGLEE) injection 30 Units (has no administration in time range)  insulin aspart (novoLOG) injection 0-20 Units (has no administration in time range)  insulin aspart (novoLOG) injection 0-5 Units (has no administration in time range)  furosemide (LASIX) injection 80 mg (has no administration in time range)  ondansetron (ZOFRAN) injection 4 mg (4 mg Intravenous Given 04/12/23 1528)  oxyCODONE-acetaminophen (PERCOCET/ROXICET) 5-325 MG per tablet 1 tablet (1 tablet Oral Given 04/12/23 1352)  furosemide (LASIX) injection 80 mg (80 mg Intravenous Given 04/12/23 1527)  potassium chloride SA (KLOR-CON M) CR tablet 40 mEq (40 mEq Oral Given 04/12/23 1527)  insulin aspart (novoLOG) injection 10 Units (10 Units Subcutaneous Given 04/12/23 1528)    Mobility walks with device and person assist     Focused Assessments    R Recommendations: See Admitting Provider Note  Report given to:   Additional Notes: Patient has a below the knee amputation on the left knee. And requires assistance. She does have a prosthetic leg to go on this amputation.

## 2023-04-12 NOTE — Assessment & Plan Note (Signed)
Doing well on Prilosec, continue.

## 2023-04-12 NOTE — H&P (Cosign Needed Addendum)
Hospital Admission History and Physical Service Pager: [phone]  Patient name: [name] Medical record number: [id number] Date of Birth: March 19, 1976 Age: 47 y.o. Gender: female  Primary Care Provider: Latrelle Dodrill, MD Consultants: Nephrology Code Status: FULL  Preferred Emergency Contact:  Contact Information     Name Relation Home Work Mobile   Antinette, [place]) Daughter   [phone]      Patient reports that she would like to change HCPOA--who is her mother.   Chief Complaint: Concern for uremia, nausea, shaking   Assessment and Plan: [name] is a 47 y.o. female presenting with elevated BUN and hypervolemia. Differential and plan below.  * Uremia Not encephalopathic. BUN 139>137. Endorses intermittent shakes (not present on admission), episode of muscle camping yesterday and headache today. Unclear etiology. Suspect pre-renal and post-renal injury (Creatine mildly elevated to 1.67, possible cocaine toxicity, suspicion of urinary retention, intravascular depletion 2/2 3rd spacing). See Urinary retention, hypervolemia for details. No overt signs of bleeding (hgb stable). No steroid use. In setting of LE edema and ascites, will attempt diuresis + foley placement prior to initiating fluids. Consider IVF for intravascular repletion if diuresing and voiding. Nephrology plans to see in AM. -Admit to FMTS, med-surg, attending Dr. Manson Passey -Nephrology consulted, appreciate recs  -Reach out to Nephro urgently if patient becomes encephalopathic -s/p 80 mg IV lasix, redose in AM -Avoid unnecessary nephrotoxic agents -q4h Neuro checks -AM BMP  Urinary retention Patient endorses only 1 void per day despite significant diuretic regimen. Previous admission patient required foley catheter for retention, and trace R hydronephrosis. She initially passed void trial prior to D/C. Unclear etiology, differential includes: Neurogenic bladder (diabetes), urinary tract infection  (Leukocytes on UA). Lower suspicion of stone (CT 1 year ago without nephrolithiasis). -Place Foley catheter -Renal U/S -Consider CT renal stone  Hypervolemia Differential includes CHF exacerbation and urinary retention. Patient reports compliance with home diuretics, but peeing once a day. Recent 2 admissions required foley catheter for retention (see below). -S/p 80 mg IV lasix, redose in AM -Strict I/O's -Daily weights -Caution with IVF  Hypokalemia K+ 3.0. Unable to tolerate pill. Will replete with powder. -40 meq potassium powder -AM BMP  Type 2 diabetes mellitus with diabetic neuropathy, unspecified ([name]) Glucose elevated to 611 on admission, 500 after 10U. Historically sugars are well-controlled during admission with diet modification and half of home basal insulin + rSSI. Low concern for DKA (see anion gap below).  -CBGs -Semglee 30 U BID -rSSI -QHS coverage  Anion Gap pH 7.37. Bicarb 39.6, likely chronically elevated in setting of COPD, OSA, OHS. Primarily suspect related to uremia. Low concern for DKA despite elevated glucose (no urinary ketones, negative BHB). -AM BMP  Chronic and stable medical conditions Neuropathy: Reduced gabapentin to half her home dose in setting of unknown renal injury. HTN: hold BP meds in setting of hypotension COPD: Continue home inhalers OSA: CPAP QHS Mood disorder: Continue home meds Glaucoma: Continue eye drops   FEN/GI: Carb modified VTE Prophylaxis: Lovenox  Disposition: Med-surg  History of Present Illness:  [name] is a 47 y.o. female presenting with shaking and nausea. She saw Dr. Pollie Meyer in office, was found to have a high BUN, felt it warranted further evaluation in the ED.   She reports nausea for about 2 weeks with dyspnea for about two days.  She reports of numbness in the leg with swelling, headaches and back pain as well.  Reports feeling sleepy, slept all day yesterday Shaking, notes "spasms"  and pain in the  arms  Voiding 1-2 x daily, sometimes has difficulty with trickling. Dysuria and burning with urination present as well.   Uses 3-4L of oxygen at home at baseline, CPAP for OSA at night.   Has not recently had a steroid. No active bleeding, not on AC.   Sent in for Uremia. BUN 136, unclear reason.  -gotten lasix, potasium, insulin,  -GAP but pH, BHB, no ketone.   No encephalopathic.    In the ED, received K+, lasix, zofran and oxycodone for pain. Work-up thus far unremarkable. FMTS paged for admisson.  Review Of Systems: Per HPI  Pertinent Past Medical History: OHS OSA HFpEF COPD HTN HLD CAD T2DM Remainder reviewed in history tab.   Pertinent Past Surgical History: S/p L BKA + R TMA (2019, 2023)   Remainder reviewed in history tab.  Pertinent Social History: Tobacco use: Former Alcohol use: Denies Other Substance use: Cocaine (last use Friday 5/3)  Pertinent Family History:  Reviewed in history tab.   Important Outpatient Medications:  Reviewed in medication history.   Objective: BP (!) 162/85   Pulse 97   Temp 98.1 F (36.7 C) (Oral)   Resp 16   SpO2 97%  Exam: General: NAD, chronically ill-appearing Eyes: Erythematous conjunctiva Neck: FROM Cardiovascular: RRR, no murmurs (limited by body habitus) Respiratory: CTAB, normal wob on 4L LFNC (limited by body habitus) Gastrointestinal: Tight, distended abdomen c/f ascites.  Extremities: R foot TMA, L leg BKA. +1 edema of right leg. Neuro: A&Ox4, no overt neurologic deficits, no tremors  Labs:  CBC BMET  Recent Labs  Lab 04/12/23 1231 04/12/23 1522  WBC 11.5*  --   HGB 12.7 15.3*  HCT 38.9 45.0  PLT 235  --    Recent Labs  Lab 04/12/23 1231 04/12/23 1522  NA 129* 130*  K 3.0* 3.9  CL 80*  --   CO2 31  --   BUN 137*  --   CREATININE 1.67*  --   GLUCOSE 611*  --   CALCIUM 9.2  --      EKG: Sinus, borderline tachycardic, QTC prolongation   Imaging Studies Performed:  DG Chest 2  View Result Date: 04/12/2023 IMPRESSION: No active cardiopulmonary disease.  Tiffany Kocher, DO 04/12/2023, 6:00 PM PGY-1, [facility]  FPTS Intern pager: [phone], text pages welcome Secure chat group [facility] Service    Upper Level Addendum:  I have seen and evaluated this patient along with Dr. Claudean Severance and reviewed the above note, making necessary revisions as appropriate.  I agree with the medical decision making and physical exam as noted above.  Alfredo Martinez, MD PGY-2 [facility] Res[facility]

## 2023-04-12 NOTE — Assessment & Plan Note (Signed)
Improving BGL to 300s. Receiving max dose 20U of SSI. -Increase to Semglee 70U BID -Increase to Novolog 20U TID -rSSI + qhs coverage

## 2023-04-12 NOTE — ED Provider Triage Note (Signed)
Emergency Medicine Provider Triage Evaluation Note  [name] , a 47 y.o. female  was evaluated in triage.  Pt complains of shortness of breath, abnormal labs.  Patient was called by Dr. After labs drawn at PCP office yesterday.  BUN elevated 139, creatinine elevated, patient endorses some sharp right-sided chest pain as well.  She reports some increased confusion, sleepiness.  Review of Systems  Positive: Chest pain, shortness of breath, weakness, confusion, somnolence Negative: Fever, chills, nausea, vomiting  Physical Exam  BP (!) 146/95 (BP Location: Left Arm)   Pulse 92   Temp 98 F (36.7 C) (Oral)   Resp 17   SpO2 96%  Gen:   Awake, morbidly obese, older than stated age, chronically ill-appearing Resp:  Normal effort  MSK:   Moves extremities without difficulty  Other:  No accessory breath sounds on my exam, patient is on her home nasal cannula  Medical Decision Making  Medically screening exam initiated at 12:33 PM.  Appropriate orders placed.  [name] was informed that the remainder of the evaluation will be completed by another provider, this initial triage assessment does not replace that evaluation, and the importance of remaining in the ED until their evaluation is complete.  Workup initiated in triage    [name], [place] 04/12/23 1233

## 2023-04-12 NOTE — Assessment & Plan Note (Addendum)
K+ 3.0. Unable to tolerate pill. Will replete with powder. -40 meq potassium powder -AM BMP

## 2023-04-12 NOTE — Assessment & Plan Note (Signed)
Likely in the setting of non-compliance with HF medications. 2.1L UOP with IV Lasix. Home regimen Torsemide 100mg  BID. Currently in 4L [place], reports using 3-4L at home. -Redose IV Lasix 80mg  -Strict I&Os and daily weights

## 2023-04-12 NOTE — Assessment & Plan Note (Signed)
Currently stable on 3 L oxygen continuously.

## 2023-04-12 NOTE — Assessment & Plan Note (Addendum)
Informed patient I am unable to drive her any further chronic narcotics due to her cocaine use. Discussed buying lidocaine over-the-counter.

## 2023-04-12 NOTE — ED Notes (Signed)
Assisted pt to [facility] for BM. She states she needs assistance to wipe bottom, has live-in caregiver. Pt also needs help getting on / off bed and does not want to wear prosthetic when in bed.

## 2023-04-12 NOTE — Progress Notes (Addendum)
FMTS Interim Progress Note  S: Night rounded w/ Dr. Melba Coon. Pt requests compression stockings.   O: BP (!) 128/117 (BP Location: Right Wrist)   Pulse 95   Temp 97.7 F (36.5 C) (Oral)   Resp 20   SpO2 95%   Neuro: Alert and grossly oriented. Responding appropriately in full sentences and able to provide history.  A/P: - Renal ultrasound without evidence of dilation - Glucose elevated to 400's. Will give additional 10u glargine now (40 u glargine total tonight) - Added compression stockings - Monitor UOP ON (s/p lasix) - Rest of plan per dayteam H&P  Lincoln Brigham, MD 04/12/2023, 10:27 PM PGY-1, [facility] pager [phone]

## 2023-04-12 NOTE — Assessment & Plan Note (Addendum)
Not encephalopathic. BUN 139>137. Endorses intermittent shakes (not present on admission), episode of muscle camping yesterday and headache today. Unclear etiology. Suspect pre-renal and post-renal injury (Creatine mildly elevated to 1.67, possible cocaine toxicity, suspicion of urinary retention, intravascular depletion 2/2 3rd spacing). See Urinary retention, hypervolemia for details. No overt signs of bleeding (hgb stable). No steroid use. In setting of LE edema and ascites, will attempt diuresis + foley placement prior to initiating fluids. Consider IVF for intravascular repletion if diuresing and voiding. Nephrology plans to see in AM. -Admit to FMTS, med-surg, attending Dr. Manson Passey -Nephrology consulted, appreciate recs  -Reach out to Nephro urgently if patient becomes encephalopathic -s/p 80 mg IV lasix, redose in AM -Avoid unnecessary nephrotoxic agents -q4h Neuro checks -AM BMP

## 2023-04-12 NOTE — Telephone Encounter (Signed)
Called patient about elevated BUN of 139. This was unexpected - OZH:YQMVH ratio is 80. Lab result says they confirmed the result again.  On questioning patient does endorse feeling more sleepy and confused recently. She did not seem uremic to me during our visit yesterday.  I asked her to come back today to have this lab number redrawn but she says she is not able to come to the clinic as she has no way of getting here. She is dependent on medicaid transportation which requires several days notice to go to an outpatient setting.  I advised she go to the ED for evaluation for uremia - it does not seem safe to let new BUN >130 wait outpatient for several days.  Latrelle Dodrill, MD

## 2023-04-12 NOTE — Assessment & Plan Note (Addendum)
Remove Foley catheter. Observe UOP.

## 2023-04-12 NOTE — ED Provider Notes (Signed)
[place] EMERGENCY DEPARTMENT AT [facility] Provider Note   CSN: [id number] Arrival date & time: 04/12/23  1211     History {Add pertinent medical, surgical, social history, OB history to HPI:1} Chief Complaint  Patient presents with   Shortness of Breath   Abnormal Labs    [name] is a 47 y.o. female.  She has a history of CKD COPD CHF chronic oxygen 3 to 4 L multiple amputations.  She said she has had nausea that is been going on for 2 weeks.  Shortness of breath that is worsened since yesterday with nonproductive cough.  She saw her PCP yesterday who did lab work and she received a call today that her BUN was high and that she needed to come to the emergency department.  She has been taking her regular medications as prescribed.  She also endorses some dysuria.  No fevers or chills no chest pain or abdominal pain.  She does feel more fluid gain on her abdomen and leg right  The history is provided by the patient.  Shortness of Breath Severity:  Moderate Onset quality:  Gradual Duration:  2 days Timing:  Constant Progression:  Unchanged Chronicity:  Recurrent Relieved by:  Nothing Worsened by:  Activity and coughing Ineffective treatments:  Diuretics Associated symptoms: cough   Associated symptoms: no abdominal pain, no chest pain, no fever, no hemoptysis, no sputum production, no vomiting and no wheezing   Risk factors: obesity        Home Medications Prior to Admission medications   Medication Sig Start Date End Date Taking? Authorizing Provider  acetaminophen (TYLENOL) 325 MG tablet Take 1-2 tablets (325-650 mg total) by mouth every 4 (four) hours as needed for mild pain. 06/04/22   Setzer, Lynnell Jude, PA-C  albuterol (VENTOLIN HFA) 108 (90 Base) MCG/ACT inhaler Inhale 2 puffs into the lungs every 6 (six) hours as needed for wheezing or shortness of breath. 03/28/23   Latrelle Dodrill, MD  amLODipine (NORVASC) 10 MG tablet Take 1 tablet (10 mg total) by  mouth daily. 09/27/22   Latrelle Dodrill, MD  aspirin EC 81 MG tablet Take 1 tablet (81 mg total) by mouth daily. Swallow whole. 11/19/20   Tonny Bollman, MD  Brinzolamide-Brimonidine [facility]) 1-0.2 % SUSP Place 1 drop into the left eye in the morning, at noon, and at bedtime.    [provider]  budesonide-formoterol (SYMBICORT) 160-4.5 MCG/ACT inhaler Inhale 2 puffs into the lungs 2 (two) times daily. 03/01/23   Latrelle Dodrill, MD  cetirizine (ZYRTEC) 10 MG tablet Take 1 tablet (10 mg total) by mouth daily as needed for allergies. 02/11/22   Latrelle Dodrill, MD  Continuous Blood Gluc Sensor (DEXCOM G6 SENSOR) MISC Inject 1 applicator into the skin as directed. Change sensor every 10 days. 11/05/21   Latrelle Dodrill, MD  Continuous Blood Gluc Transmit (DEXCOM G6 TRANSMITTER) MISC Inject 1 Device into the skin as directed. Reuse 8 times with sensor changes. 09/03/21   Latrelle Dodrill, MD  diclofenac Sodium (VOLTAREN) 1 % GEL Apply 2 g topically 4 (four) times daily. 02/26/23   Fayette Pho, MD  DULoxetine (CYMBALTA) 60 MG capsule TAKE 2 CAPSULES BY MOUTH DAILY 03/28/23   Latrelle Dodrill, MD  gabapentin (NEURONTIN) 600 MG tablet Start with 1 tablet three times daily, can gradually increase to 2 tablets three times daily Patient taking differently: Take 1,200 mg by mouth 3 (three) times daily. 03/07/23   Levert Feinstein  J, MD  GNP ULTICARE PEN NEEDLES 32G X 4 MM MISC USE TO inject insulin 2 TIMES DAILY 10/02/21   Latrelle Dodrill, MD  insulin aspart (NOVOLOG FLEXPEN) 100 UNIT/ML FlexPen Inject 20 Units into the skin 3 (three) times daily with meals. Patient taking differently: Inject 30 Units into the skin 3 (three) times daily with meals. 05/25/22   Maury Dus, MD  insulin degludec (TRESIBA FLEXTOUCH) 200 UNIT/ML FlexTouch Pen Inject 52 Units into the skin 2 (two) times daily. Patient taking differently: Inject 60 Units into the skin 2 (two) times daily.  06/04/22   Setzer, Lynnell Jude, PA-C  Insulin Disposable Pump (OMNIPOD 5 G6 POD, GEN 5,) MISC Inject into the skin daily. 07/30/22   [provider]  ipratropium-albuterol (DUONEB) 0.5-2.5 (3) MG/3ML SOLN Take 3 mLs by nebulization every 6 (six) hours as needed. Patient taking differently: Take 3 mLs by nebulization every 6 (six) hours as needed (for shortness of breath and wheezing). 09/24/22   Charlott Holler, MD  latanoprost (XALATAN) 0.005 % ophthalmic solution Place 1 drop into both eyes daily. 02/04/23   [provider]  lidocaine (LIDODERM) 5 % Place 1 patch onto the skin daily. Remove & Discard patch within 12 hours or as directed by MD 02/27/23   Fayette Pho, MD  metolazone (ZAROXOLYN) 2.5 MG tablet Take 1 tablet (2.5 mg total) by mouth daily. 03/21/23   Lockie Mola, MD  Multiple Vitamins-Minerals (ONE-A-DAY WOMENS) tablet Take 1 tablet by mouth daily.    [provider]  nitroGLYCERIN (NITROSTAT) 0.4 MG SL tablet Place 1 tablet (0.4 mg total) under the tongue every 5 (five) minutes as needed for chest pain. 11/04/22   Latrelle Dodrill, MD  nystatin (MYCOSTATIN/NYSTOP) powder Apply 1 Application topically 3 (three) times daily. 03/07/23   Latrelle Dodrill, MD  omeprazole (PRILOSEC) 40 MG capsule Take 1 capsule (40 mg total) by mouth in the morning and at bedtime. 04/07/23   Charlott Holler, MD  ondansetron (ZOFRAN) 4 MG tablet Take 1 tablet (4 mg total) by mouth every 8 (eight) hours as needed for nausea or vomiting. 02/17/22   Lovorn, Aundra Millet, MD  RHOPRESSA 0.02 % SOLN Place 1 drop into the left eye daily. 01/21/23   [provider]  rOPINIRole (REQUIP) 1 MG tablet Take 1 tablet (1 mg total) by mouth at bedtime. 12/16/22   Latrelle Dodrill, MD  rosuvastatin (CRESTOR) 40 MG tablet Take 1 tablet (40 mg total) by mouth daily. 11/04/22   Latrelle Dodrill, MD  torsemide (DEMADEX) 100 MG tablet Take 1 tablet (100 mg total) by mouth 2 (two) times daily.  03/22/23   Latrelle Dodrill, MD  traZODone (DESYREL) 100 MG tablet TAKE 1 TABLET BY MOUTH AT BEDTIME AS NEEDED FOR SLEEP 03/03/23   Latrelle Dodrill, MD  Vitamin D, Cholecalciferol, 25 MCG (1000 UT) CAPS Take 1,000 Units by mouth daily. 10/05/22   Latrelle Dodrill, MD      Allergies    Cefepime, Iodine, Kiwi extract, Morphine and related, Pentoxifylline, Cephalosporins, Toradol [ketorolac tromethamine], and Nubain [nalbuphine hcl]    Review of Systems   Review of Systems  Constitutional:  Negative for fever.  Respiratory:  Positive for cough and shortness of breath. Negative for hemoptysis, sputum production and wheezing.   Cardiovascular:  Positive for leg swelling. Negative for chest pain.  Gastrointestinal:  Positive for nausea. Negative for abdominal pain and vomiting.  Genitourinary:  Positive for dysuria.    Physical  Exam Updated Vital Signs BP (!) 142/86   Pulse 96   Temp 98.2 F (36.8 C)   Resp 16   SpO2 98%  Physical Exam Vitals and nursing note reviewed.  Constitutional:      General: She is not in acute distress.    Appearance: She is well-developed. She is obese.  HENT:     Head: Normocephalic and atraumatic.  Eyes:     Conjunctiva/sclera: Conjunctivae normal.  Cardiovascular:     Rate and Rhythm: Normal rate and regular rhythm.     Heart sounds: No murmur heard. Pulmonary:     Effort: Pulmonary effort is normal. No respiratory distress.     Breath sounds: Normal breath sounds.  Abdominal:     Palpations: Abdomen is soft.     Tenderness: There is no abdominal tenderness. There is no guarding or rebound.  Musculoskeletal:        General: No swelling.     Cervical back: Neck supple.     Right lower leg: No tenderness. Edema present.     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  There is possibly trace edema on the right.  Skin:    General: Skin is warm and dry.     Capillary Refill: Capillary refill takes less than 2  seconds.  Neurological:     General: No focal deficit present.     Mental Status: She is alert.     ED Results / Procedures / Treatments   Labs (all labs ordered are listed, but only abnormal results are displayed) Labs Reviewed  CBC - Abnormal; Notable for the following components:      Result Value   WBC 11.5 (*)    All other components within normal limits  COMPREHENSIVE METABOLIC PANEL  BRAIN NATRIURETIC PEPTIDE  BETA-HYDROXYBUTYRIC ACID  I-STAT VENOUS BLOOD GAS, ED  TROPONIN I (HIGH SENSITIVITY)    EKG None  Radiology DG Chest 2 View  Result Date: 04/12/2023 CLINICAL DATA:  Six-month history of shortness of breath EXAM: CHEST - 2 VIEW COMPARISON:  Chest radiograph dated 03/19/2023 FINDINGS: Normal lung volumes. No focal consolidations. No pleural effusion or pneumothorax. The heart size and mediastinal contours are within normal limits. No acute osseous abnormality. IMPRESSION: No active cardiopulmonary disease. Electronically Signed   By: Agustin Cree M.D.   On: 04/12/2023 13:07    Procedures Procedures  {Document cardiac monitor, telemetry assessment procedure when appropriate:1}  Medications Ordered in ED Medications - No data to display  ED Course/ Medical Decision Making/ A&P Clinical Course as of 04/12/23 1317  Tue Apr 12, 2023  1301 Dust x-ray interpreted by me as cardiac enlargement no gross infiltrate.  Awaiting radiology reading. [MB]    Clinical Course User Index [MB] Terrilee Files, MD   {   Click here for ABCD2, HEART and other calculatorsREFRESH Note before signing :1}                          Medical Decision Making  This patient complains of ***; this involves an extensive number of treatment Options and is a complaint that carries with it a high risk of complications and morbidity. The differential includes ***  I ordered, reviewed and interpreted labs, which included *** I ordered medication *** and reviewed PMP when indicated. I ordered  imaging studies which included *** and I independently    visualized and interpreted imaging which showed *** Additional history obtained  from *** Previous records obtained and reviewed *** I consulted *** and discussed lab and imaging findings and discussed disposition.  Cardiac monitoring reviewed, *** Social determinants considered, *** Critical Interventions: ***  After the interventions stated above, I reevaluated the patient and found *** Admission and further testing considered, ***   {Document critical care time when appropriate:1} {Document review of labs and clinical decision tools ie heart score, Chads2Vasc2 etc:1}  {Document your independent review of radiology images, and any outside records:1} {Document your discussion with family members, caretakers, and with consultants:1} {Document social determinants of health affecting pt's care:1} {Document your decision making why or why not admission, treatments were needed:1} Final Clinical Impression(s) / ED Diagnoses Final diagnoses:  None    Rx / DC Orders ED Discharge Orders     None

## 2023-04-12 NOTE — ED Triage Notes (Signed)
Pt [place] ED [facility] from [facility]. Pt's doctor called pt and stated that pt's BUN was elevated and advised pt to come to ED. Upon EMS arrival, pt was SOB that has been going on for 6 months. Pt on 3-4L [place] baseline. Pt has hx of DM and pt's CBG was elevated with EMS at 569. Pt states she took insulin right before being transferred to ED.   EMS Vitals: 569 CBG 98% 4L

## 2023-04-12 NOTE — H&P (Signed)
   FMTS Attending Admission Note: Terisa Starr, MD   For questions about this patient, please use [email or web address] to page the family medicine resident on call. Pager number [phone].    I  have seen and examined this patient, reviewed their chart. I have discussed this patient with the resident.Will sign resident note as available.   Briefly 47 yo with history of CKD, type 2 DM, and PVD presenting with uremia from clinic. She reports several weeks of nausea, edema, decreased urine output, and shakiness. No new medications or OTC medication use. Did use cocaine last Friday. She is very upset her 'kidneys are failing again'.  Labs reviewed. EKG shows prolonged QTC with artifact CXR reviewed Uremia, unclear cause, possible intrinsic renal vs. Prerenal. Obtain renal ultrasound, Foley catheter, urine electrolytes. Consult to Nephrology--greatly appreciate input.  Hyperglycemia, no acidosis but does have anion gap. Suspect anion gap multifactorial. Will restart home long acting insulin, IV fluids, repeat BMP in 6 hours.  ST changes in inferior leads, denies chest pain, repeat EKG.  Remainder per resident note.

## 2023-04-12 NOTE — Assessment & Plan Note (Addendum)
Recent hospitalization for volume overload.  Check BMET today for follow-up.  Volume status does seem up but is very challenging to assess given habitus.  Her weight is not up from prior, it is actually down.  Continue current diuresis at this time.

## 2023-04-13 DIAGNOSIS — H409 Unspecified glaucoma: Secondary | ICD-10-CM | POA: Diagnosis not present

## 2023-04-13 DIAGNOSIS — Z79899 Other long term (current) drug therapy: Secondary | ICD-10-CM | POA: Diagnosis not present

## 2023-04-13 DIAGNOSIS — E876 Hypokalemia: Secondary | ICD-10-CM | POA: Diagnosis not present

## 2023-04-13 DIAGNOSIS — F3181 Bipolar II disorder: Secondary | ICD-10-CM | POA: Diagnosis not present

## 2023-04-13 DIAGNOSIS — I110 Hypertensive heart disease with heart failure: Secondary | ICD-10-CM | POA: Diagnosis not present

## 2023-04-13 DIAGNOSIS — R339 Retention of urine, unspecified: Secondary | ICD-10-CM | POA: Diagnosis not present

## 2023-04-13 DIAGNOSIS — I5033 Acute on chronic diastolic (congestive) heart failure: Secondary | ICD-10-CM | POA: Diagnosis not present

## 2023-04-13 DIAGNOSIS — Z8616 Personal history of COVID-19: Secondary | ICD-10-CM | POA: Diagnosis not present

## 2023-04-13 DIAGNOSIS — J4489 Other specified chronic obstructive pulmonary disease: Secondary | ICD-10-CM | POA: Diagnosis not present

## 2023-04-13 DIAGNOSIS — J9611 Chronic respiratory failure with hypoxia: Secondary | ICD-10-CM | POA: Diagnosis not present

## 2023-04-13 DIAGNOSIS — Z794 Long term (current) use of insulin: Secondary | ICD-10-CM | POA: Diagnosis not present

## 2023-04-13 DIAGNOSIS — E1122 Type 2 diabetes mellitus with diabetic chronic kidney disease: Secondary | ICD-10-CM | POA: Diagnosis not present

## 2023-04-13 DIAGNOSIS — Z87891 Personal history of nicotine dependence: Secondary | ICD-10-CM | POA: Diagnosis not present

## 2023-04-13 DIAGNOSIS — G2581 Restless legs syndrome: Secondary | ICD-10-CM | POA: Diagnosis not present

## 2023-04-13 DIAGNOSIS — E1151 Type 2 diabetes mellitus with diabetic peripheral angiopathy without gangrene: Secondary | ICD-10-CM | POA: Diagnosis not present

## 2023-04-13 DIAGNOSIS — N19 Unspecified kidney failure: Secondary | ICD-10-CM | POA: Diagnosis not present

## 2023-04-13 DIAGNOSIS — E1140 Type 2 diabetes mellitus with diabetic neuropathy, unspecified: Secondary | ICD-10-CM | POA: Diagnosis not present

## 2023-04-13 DIAGNOSIS — R300 Dysuria: Secondary | ICD-10-CM | POA: Diagnosis not present

## 2023-04-13 DIAGNOSIS — E662 Morbid (severe) obesity with alveolar hypoventilation: Secondary | ICD-10-CM | POA: Diagnosis not present

## 2023-04-13 DIAGNOSIS — Z89512 Acquired absence of left leg below knee: Secondary | ICD-10-CM | POA: Diagnosis not present

## 2023-04-13 DIAGNOSIS — R0602 Shortness of breath: Secondary | ICD-10-CM | POA: Diagnosis not present

## 2023-04-13 DIAGNOSIS — E1165 Type 2 diabetes mellitus with hyperglycemia: Secondary | ICD-10-CM | POA: Diagnosis not present

## 2023-04-13 DIAGNOSIS — I959 Hypotension, unspecified: Secondary | ICD-10-CM | POA: Diagnosis not present

## 2023-04-13 DIAGNOSIS — E785 Hyperlipidemia, unspecified: Secondary | ICD-10-CM | POA: Diagnosis not present

## 2023-04-13 DIAGNOSIS — Z9981 Dependence on supplemental oxygen: Secondary | ICD-10-CM | POA: Diagnosis not present

## 2023-04-13 DIAGNOSIS — Z89431 Acquired absence of right foot: Secondary | ICD-10-CM | POA: Diagnosis not present

## 2023-04-13 LAB — BASIC METABOLIC PANEL
Anion gap: 15 (ref 5–15)
Anion gap: 17 — ABNORMAL HIGH (ref 5–15)
BUN: 138 mg/dL — ABNORMAL HIGH (ref 6–20)
BUN: 134 mg/dL — ABNORMAL HIGH (ref 6–20)
CO2: 33 mmol/L — ABNORMAL HIGH (ref 22–32)
CO2: 33 mmol/L — ABNORMAL HIGH (ref 22–32)
Calcium: 9.5 mg/dL (ref 8.9–10.3)
Calcium: 9.6 mg/dL (ref 8.9–10.3)
Chloride: 80 mmol/L — ABNORMAL LOW (ref 98–111)
Chloride: 79 mmol/L — ABNORMAL LOW (ref 98–111)
Creatinine, Ser: 1.82 mg/dL — ABNORMAL HIGH (ref 0.44–1.00)
Creatinine, Ser: 1.85 mg/dL — ABNORMAL HIGH (ref 0.44–1.00)
GFR, Estimated: 35 mL/min — ABNORMAL LOW (ref >60)
GFR, Estimated: 34 mL/min — ABNORMAL LOW (ref >60)
Glucose, Bld: 377 mg/dL — ABNORMAL HIGH (ref 70–99)
Glucose, Bld: 481 mg/dL — ABNORMAL HIGH (ref 70–99)
Potassium: 3.1 mmol/L — ABNORMAL LOW (ref 3.5–5.1)
Potassium: 3.5 mmol/L (ref 3.5–5.1)
Sodium: 128 mmol/L — ABNORMAL LOW (ref 135–145)
Sodium: 129 mmol/L — ABNORMAL LOW (ref 135–145)

## 2023-04-13 LAB — GLUCOSE, CAPILLARY
Glucose-Capillary: 435 mg/dL — ABNORMAL HIGH (ref 70–99)
Glucose-Capillary: 526 mg/dL (ref 70–99)
Glucose-Capillary: 426 mg/dL — ABNORMAL HIGH (ref 70–99)
Glucose-Capillary: 367 mg/dL — ABNORMAL HIGH (ref 70–99)

## 2023-04-13 LAB — CBC
HCT: 38.9 % (ref 36.0–46.0)
Hemoglobin: 12.6 g/dL (ref 12.0–15.0)
MCH: 27.8 pg (ref 26.0–34.0)
MCHC: 32.4 g/dL (ref 30.0–36.0)
MCV: 85.7 fL (ref 80.0–100.0)
Platelets: 237 10*3/uL (ref 150–400)
RBC: 4.54 MIL/uL (ref 3.87–5.11)
RDW: 15.3 % (ref 11.5–15.5)
WBC: 9.6 10*3/uL (ref 4.0–10.5)
nRBC: 0 % (ref 0.0–0.2)

## 2023-04-13 LAB — URINE CULTURE: Culture: NO GROWTH

## 2023-04-13 LAB — SODIUM, URINE, RANDOM: Sodium, Ur: 54 mmol/L

## 2023-04-13 LAB — HEPATIC FUNCTION PANEL
ALT: 38 U/L (ref 0–44)
AST: 35 U/L (ref 15–41)
Albumin: 3.2 g/dL — ABNORMAL LOW (ref 3.5–5.0)
Alkaline Phosphatase: 133 U/L — ABNORMAL HIGH (ref 38–126)
Bilirubin, Direct: <0.1 mg/dL (ref 0.0–0.2)
Total Bilirubin: 0.3 mg/dL (ref 0.3–1.2)
Total Protein: 8.4 g/dL — ABNORMAL HIGH (ref 6.5–8.1)

## 2023-04-13 LAB — CREATININE, URINE, RANDOM: Creatinine, Urine: 35 mg/dL

## 2023-04-13 MED ORDER — ENOXAPARIN SODIUM 80 MG/0.8ML IJ SOSY
80.0000 mg | PREFILLED_SYRINGE | INTRAMUSCULAR | Status: DC
  Administered 2023-04-13: 80 mg via SUBCUTANEOUS
  Filled 2023-04-13: qty 0.8

## 2023-04-13 MED ORDER — INSULIN ASPART 100 UNIT/ML IJ SOLN
10.0000 [IU] | Freq: Once | INTRAMUSCULAR | Status: AC
  Administered 2023-04-13: 10 [IU] via SUBCUTANEOUS

## 2023-04-13 MED ORDER — POTASSIUM CHLORIDE 20 MEQ PO PACK
40.0000 meq | PACK | Freq: Once | ORAL | Status: DC
  Filled 2023-04-13: qty 2

## 2023-04-13 MED ORDER — FUROSEMIDE 10 MG/ML IJ SOLN
80.0000 mg | Freq: Once | INTRAMUSCULAR | Status: AC
  Administered 2023-04-13: 80 mg via INTRAVENOUS
  Filled 2023-04-13: qty 8

## 2023-04-13 MED ORDER — DULOXETINE HCL 60 MG PO CPEP
120.0000 mg | ORAL_CAPSULE | Freq: Every day | ORAL | Status: DC
  Administered 2023-04-14: 120 mg via ORAL
  Filled 2023-04-13: qty 2

## 2023-04-13 MED ORDER — CHLORHEXIDINE GLUCONATE CLOTH 2 % EX PADS: 6.0000 | MEDICATED_PAD | Freq: Every day | CUTANEOUS | Status: DC

## 2023-04-13 MED ORDER — POTASSIUM CHLORIDE CRYS ER 20 MEQ PO TBCR
40.0000 meq | EXTENDED_RELEASE_TABLET | Freq: Once | ORAL | Status: DC
  Filled 2023-04-13: qty 2

## 2023-04-13 MED ORDER — ONDANSETRON HCL 4 MG/2ML IJ SOLN
4.0000 mg | Freq: Once | INTRAMUSCULAR | Status: AC
  Administered 2023-04-13: 4 mg via INTRAVENOUS
  Filled 2023-04-13: qty 2

## 2023-04-13 MED ORDER — ALBUTEROL SULFATE (2.5 MG/3ML) 0.083% IN NEBU: 2.5000 mg | INHALATION_SOLUTION | Freq: Four times a day (QID) | RESPIRATORY_TRACT | Status: DC | PRN

## 2023-04-13 MED ORDER — INSULIN GLARGINE-YFGN 100 UNIT/ML ~~LOC~~ SOLN
60.0000 [IU] | Freq: Two times a day (BID) | SUBCUTANEOUS | Status: DC
  Administered 2023-04-13 – 2023-04-14 (×2): 60 [IU] via SUBCUTANEOUS
  Filled 2023-04-13 (×3): qty 0.6

## 2023-04-13 MED ORDER — PROCHLORPERAZINE MALEATE 5 MG PO TABS
5.0000 mg | ORAL_TABLET | Freq: Four times a day (QID) | ORAL | Status: DC | PRN
  Administered 2023-04-13 (×2): 5 mg via ORAL
  Filled 2023-04-13 (×5): qty 1

## 2023-04-13 MED ORDER — INSULIN ASPART 100 UNIT/ML IJ SOLN
20.0000 [IU] | Freq: Three times a day (TID) | INTRAMUSCULAR | Status: DC
  Administered 2023-04-14 (×3): 20 [IU] via SUBCUTANEOUS

## 2023-04-13 MED ORDER — DULOXETINE HCL 60 MG PO CPEP: 60.0000 mg | ORAL_CAPSULE | Freq: Every day | ORAL | Status: DC

## 2023-04-13 MED ORDER — INSULIN ASPART 100 UNIT/ML IJ SOLN
10.0000 [IU] | Freq: Three times a day (TID) | INTRAMUSCULAR | Status: DC
  Administered 2023-04-13 (×2): 10 [IU] via SUBCUTANEOUS

## 2023-04-13 MED ORDER — INSULIN GLARGINE-YFGN 100 UNIT/ML ~~LOC~~ SOLN
45.0000 [IU] | Freq: Two times a day (BID) | SUBCUTANEOUS | Status: DC
  Administered 2023-04-13: 45 [IU] via SUBCUTANEOUS
  Filled 2023-04-13 (×2): qty 0.45

## 2023-04-13 MED ORDER — ROSUVASTATIN CALCIUM 5 MG PO TABS
10.0000 mg | ORAL_TABLET | Freq: Every day | ORAL | Status: DC
  Administered 2023-04-13 – 2023-04-14 (×2): 10 mg via ORAL
  Filled 2023-04-13 (×2): qty 2

## 2023-04-13 MED ORDER — ROSUVASTATIN CALCIUM 20 MG PO TABS: 20.0000 mg | ORAL_TABLET | Freq: Every day | ORAL | Status: DC

## 2023-04-13 MED ORDER — POTASSIUM CHLORIDE 20 MEQ PO PACK
40.0000 meq | PACK | Freq: Once | ORAL | Status: AC
  Administered 2023-04-13: 40 meq via ORAL
  Filled 2023-04-13: qty 2

## 2023-04-13 NOTE — Progress Notes (Signed)
FMTS Interim Progress Note  S: Patient reported chest pain to nursing. Went to assess patient at bedside, reports the chest pain has resolved. States it was very brief sharp pain that went away quickly. States her chest was tender to the touch.  O: BP 133/70 (BP Location: Left Arm)   Pulse 87   Temp 97.8 F (36.6 C)   Resp 16   SpO2 99%    General: NAD, sitting up at edge of bed CV: RRR without murmur Resp: Difficult to auscultate due to body habitus  A/P: Chest pain Now resolved. Quick, sharp pain associated with tenderness to palpation over lower sternal region. Likely MSK in origin, low concern for ACS. -EKG -Monitor for recurrence  Elberta Fortis, MD 04/13/2023, 11:51 AM PGY-1, [facility] pager [phone]

## 2023-04-13 NOTE — Consult Note (Addendum)
Attestation: Morbidly obese patient presenting with azotemia.  No obvious signs of uremia on my exam and history.  She has tremors but these are stable and longstanding and do not appear to be consistent with asterixis.  It is unclear exactly why her BUN is elevated so much even though her creatinine is near her baseline.  I think supportive care is the best measure at this point.  It is felt that her volume status is overall positive but she did not have significant pulmonary edema on chest x-ray and I find her volume status very difficult to ascertain given her body habitus.  I would prefer no fluids or diuretics today.  Sometimes we can just allow the kidneys to settle and provide clearance of the BUN.  1 precipitating factor could be her ongoing cocaine use.  The patient was fairly resistant to my suggestion that this was playing a role.  Even if it is not we discussed the importance of taking this out of her ongoing picture as it confounds much of what is going on.  She says that she has to take it when she has severe pain.  Would continue with aggressive potassium supplementation.  Continue to monitor kidney function for now.  Outside records were reviewed in the care of this patient. Labs and imaging were independently interpreted to aid in clinical decision making. Findings and plan were conveyed to the primary team.   I have personally seen and examined this patient and agree with the assessment/plan as outlined below by Gerilyn Pilgrim Rothfuss.  I obtained all information provided personally and performed exam.  I made corrections to documentation as needed. Jeremy Johann 04/13/2023 1:01 PM   Nephrology Consult   Requesting provider: Dr. Manson Passey Service requesting consult: Family Medicine Reason for consult: Azotemia   Assessment/Recommendations: [name] is a/an 47 y.o. female with a past medical history Morbid obesity, OHS, uncontrolled T2DM, HFpEF, and cocaine use who present w/  Azotemia   Azotemia (stable): Not convinced that this is a true AKI, baseline Cr is around 1.3-1.7 though labile- seems to be around baseline. If this proves to be an AKI, it would likely be secondary to CHF exacerbation. UOP is fine, likely not dehydrated. No clinical signs of uremia, lacking signs including asterixis and confusion. -Chart reviewed: (Cocaine usage, no contrast noted).   -Continue to monitor daily Cr, Dose meds for GFR -Monitor Daily I/Os, Daily weight  -Maintain MAP>65 for optimal renal perfusion.  -Avoid nephrotoxic medications including NSAIDs -Use synthetic opioids (Fentanyl/Dilaudid) if needed -Currently no indication for HD, but pt states that she is willing if it comes to it in the future - Discussed risk of developing further chronic injury to kidney with each insult to kidney - Discontinue diuretics, seems to be urinating fine, and will recheck renal status tomorrow  Cocaine Use: Discussed with pt the risk of long term renal damage in the sustained use of cocaine. This may play into pt's shortness of breath over the last six months. Significant time spent counseling pt.  Uncontrolled Diabetes Mellitus Type 2 with Hyperglycemia - Counseled pt on strict glucose control outpt - Continued labile BG, suspect due to dietary noncompliance  Hypokalemia:  - Agree with continued Klor-Con  Volume Status: Difficult to ascertain fluid status on exam due to . Based on our examination and review of available imaging, our recommendation is as above.  Recommendations conveyed to primary service.   Teryl Lucy Rothfuss PA Student [facility] 04/13/2023 10:54 AM  _____________________________________________________________________________________ CC: Azotemia  History of Present Illness: [name] is a/an 47 y.o. female with a past medical history of Morbid obesity, OHS, uncontrolled T2DM, HFpEF, and cocaine use who presents w/ Azotemia. Pertinently pt has had  several hospitalizations over the past several months with likely causative or contributory factors of CHF and cocaine use. Pt presented to [facility] clinic with dysuria and cloudy urine. Pt had labs drawn which came back for an unexpected BUN of 139. Pt was advised to head to the ED, which she did, and nephro was consulted.   Pt visited in hospital room, slightly upset at first but calmed down as we talked. Pt is frustrated with her continued visits to the hospital. Pt states that she is slightly nauseated and that zofran did not help. Spent a large amount of time with patient answering questions and counseling on glucose control and discontinuing cocaine usage. Pt claimed to have some confusion but did not seem to be on examination, able to name date and family members. Discussed hospitalizing factors at length with pt. Pt denies constipation, diarrhea, and vomiting. Pt has no other questions at this time.  Medications:  Current Facility-Administered Medications  Medication Dose Route Frequency Provider Last Rate Last Admin   acetaminophen (TYLENOL) tablet 325-650 mg  325-650 mg Oral Q4H PRN Tiffany Kocher, DO       albuterol (PROVENTIL) (2.5 MG/3ML) 0.083% nebulizer solution 2.5 mg  2.5 mg Nebulization Q6H PRN Hammons, Gerhard Munch, RPH       aspirin EC tablet 81 mg  81 mg Oral Daily Tiffany Kocher, DO   81 mg at 04/12/23 1805   brimonidine (ALPHAGAN) 0.2 % ophthalmic solution 1 drop  1 drop Left Eye TID Lincoln Brigham, MD   1 drop at 04/13/23 0008   brinzolamide (AZOPT) 1 % ophthalmic suspension 1 drop  1 drop Left Eye TID Tiffany Kocher, DO   1 drop at 04/12/23 2130   Chlorhexidine Gluconate Cloth 2 % PADS 6 each  6 each Topical Daily Westley Chandler, MD       diclofenac Sodium (VOLTAREN) 1 % topical gel 2 g  2 g Topical QID Tiffany Kocher, DO   2 g at 04/12/23 2131   DULoxetine (CYMBALTA) DR capsule 120 mg  120 mg Oral Daily Espinoza, Alejandra, DO       enoxaparin (LOVENOX) injection 80  mg  80 mg Subcutaneous Q24H Hammons, Kimberly B, RPH       furosemide (LASIX) injection 80 mg  80 mg Intravenous Once Alfredo Martinez, MD       gabapentin (NEURONTIN) capsule 600 mg  600 mg Oral TID Tiffany Kocher, DO   600 mg at 04/12/23 2129   insulin aspart (novoLOG) injection 0-20 Units  0-20 Units Subcutaneous TID WC Tiffany Kocher, DO   20 Units at 04/13/23 0830   insulin aspart (novoLOG) injection 0-5 Units  0-5 Units Subcutaneous QHS Tiffany Kocher, DO   5 Units at 04/12/23 2131   insulin aspart (novoLOG) injection 10 Units  10 Units Subcutaneous TID WC Maxwell, Allee, MD       insulin glargine-yfgn (SEMGLEE) injection 60 Units  60 Units Subcutaneous BID Maxwell, Allee, MD       latanoprost (XALATAN) 0.005 % ophthalmic solution 1 drop  1 drop Both Eyes QHS Tiffany Kocher, DO   1 drop at 04/12/23 2130   loratadine (CLARITIN) tablet 10 mg  10 mg Oral Daily Tiffany Kocher, DO   10 mg at 04/12/23 1805  mometasone-formoterol (DULERA) 200-5 MCG/ACT inhaler 2 puff  2 puff Inhalation BID Tiffany Kocher, DO   2 puff at 04/13/23 0752   ondansetron (ZOFRAN) injection 4 mg  4 mg Intravenous Once Alfredo Martinez, MD       oxyCODONE (Oxy IR/ROXICODONE) immediate release tablet 5 mg  5 mg Oral Q4H PRN Tiffany Kocher, DO   5 mg at 04/13/23 0830   pantoprazole (PROTONIX) EC tablet 80 mg  80 mg Oral Daily Tiffany Kocher, DO   80 mg at 04/12/23 1806   potassium chloride (KLOR-CON) packet 40 mEq  40 mEq Oral Once Elberta Fortis, MD       potassium chloride (KLOR-CON) packet 40 mEq  40 mEq Oral Once Darnell Level, MD       prochlorperazine (COMPAZINE) tablet 5 mg  5 mg Oral Q6H PRN Lincoln Brigham, MD   5 mg at 04/13/23 0830   rosuvastatin (CRESTOR) tablet 10 mg  10 mg Oral Daily Sabino Dick, DO       traZODone (DESYREL) tablet 100 mg  100 mg Oral QHS PRN Tiffany Kocher, DO   100 mg at 04/12/23 2212     ALLERGIES Cefepime, Iodine, Kiwi extract, Morphine and related, Pentoxifylline,  Cephalosporins, Toradol [ketorolac tromethamine], and Nubain [nalbuphine hcl]  MEDICAL HISTORY Past Medical History:  Diagnosis Date   Abscess of groin, left    Acute on chronic diastolic (congestive) heart failure ([name]) 02/05/2022   Acute on chronic respiratory failure with hypoxia ([name]) 04/11/2022   Acute osteomyelitis of ankle or foot, left ([name]) 01/31/2018   Acute respiratory failure with hypoxia and hypercarbia ([name]) 06/15/2020   Acute tubular necrosis ([name]) 07/12/2022   Alveolar hypoventilation    Amputation stump infection ([name]) 04/09/2018   Anemia    not on iron pill   Asthma    Bipolar 2 disorder ([name])    Candidiasis of vagina 05/15/2022   Carpal tunnel syndrome on right    recurrent   Cellulitis 08/2010-08/2011   Chest pain with low risk for cardiac etiology 05/11/2021   Chronic pain    COPD (chronic obstructive pulmonary disease) ([name])    Symbicort daily and Proventil as needed   Costochondritis    De Quervain's tenosynovitis, bilateral 11/01/2015   Depression    Diabetes mellitus type II, uncontrolled 2000   Type 2, Uncontrolled.Takes Lantus daily.Fasting blood sugar runs 150   Diabetic ulcer of right foot ([name]) 12/19/2021   Drug-seeking behavior    Elevated troponin    Exposure to mold 04/21/2020   Family history of metabolic acidosis with increased anion gap 04/12/2023   GERD (gastroesophageal reflux disease)    takes Pantoprazole and Zantac daily   HLD (hyperlipidemia)    takes Atorvastatin daily   Hypertension    takes Lisinopril and Coreg daily   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  Mild diffuse proximal LAD stenosis with no evidence of obstructive disease (20 to 30% diffuse stenosis);  2.  Left  dominant circumflex with widely patent obtuse marginal branches and left PDA branch, no significant stenoses present  3.  Nondominant RCA with mild diffuse nonobstructive stenosis    OSA on CPAP    Peripheral neuropathy    takes Gabapentin daily   Pneumonia    "  walking" several yrs ago and as a baby (12/05/2018)   Pneumonia due to COVID-19 virus 04/14/2021   Primary osteoarthritis of first carpometacarpal joint of left hand 07/30/2016   Rectal fissure    Restless leg    Right carpal tunnel syndrome 09/01/2011   SVT (supraventricular tachycardia)    Syncope 02/25/2016   Thrombocytosis 04/11/2022   Urinary frequency    Urinary incontinence 10/23/2020   UTI (urinary tract infection) 04/15/2021   Varicose veins    Right medial thigh and Left leg      SOCIAL HISTORY Social History   Socioeconomic History   Marital status: Married    Spouse name: Not on file   Number of children: Not on file   Years of education: Not on file   Highest education level: Not on file  Occupational History   Not on file  Tobacco Use   Smoking status: Former    Packs/day: 0.25    Years: 15.00    Additional pack years: 0.00    Total pack years: 3.75    Types: Cigarettes    Quit date: 12/06/2005    Years since quitting: 17.3   Smokeless tobacco: Never   Tobacco comments:    smokes for a couple of months  Vaping Use   Vaping Use: Never used  Substance and Sexual Activity   Alcohol use: No    Alcohol/week: 0.0 standard drinks of alcohol   Drug use: Not Currently    Comment: OD attempts on home meds     Sexual activity: Yes    Partners: Male  Other Topics Concern   Not on file  Social History Narrative   Lives in [place] with her fiance and 45 yr old dtr.   Social Determinants of Health   Financial Resource Strain: High Risk (02/19/2021)   Overall Financial Resource Strain (CARDIA)    Difficulty of Paying Living Expenses: Hard  Food Insecurity: No Food Insecurity (03/22/2023)   Hunger Vital Sign     Worried About Running Out of Food in the Last Year: Never true    Ran Out of Food in the Last Year: Never true  Transportation Needs: No Transportation Needs (03/22/2023)   PRAPARE - Administrator, Civil Service (Medical): No    Lack of Transportation (Non-Medical): No  Physical Activity: Not on file  Stress: Stress Concern Present (02/19/2021)   [employer] of Occupational Health - Occupational Stress Questionnaire    Feeling of Stress : Very much  Social Connections: Not on file  Intimate Partner Violence: Not At Risk (03/22/2023)   Humiliation, Afraid, Rape, and Kick questionnaire    Fear of Current or Ex-Partner: No    Emotionally Abused: No    Physically Abused: No    Sexually Abused: No     FAMILY HISTORY Family History  Problem Relation Age of Onset   Diabetes Mother    Hyperlipidemia Mother    Depression Mother    GER disease Mother    Allergic rhinitis Mother    Restless legs syndrome Mother    Heart attack Paternal Uncle    Heart disease Paternal Grandmother    Heart attack Paternal Grandmother    Heart attack Paternal Grandfather    Heart disease Paternal Grandfather    Heart attack Father    Migraines Sister    Cancer Maternal Grandmother        COLON   Hypertension Maternal Grandmother    Hyperlipidemia Maternal Grandmother    Diabetes Maternal  Grandmother    Other Maternal Grandfather        GUN SHOT   Anxiety disorder Sister    Asthma Child       Review of Systems: ROS negative except per HPI.  Physical Exam: Vitals:   04/13/23 0752 04/13/23 0805  BP:  132/87  Pulse:  93  Resp:  16  Temp:  97.8 F (36.6 C)  SpO2: 94% 99%   Total I/O In: 414 [P.O.:414] Out: -   Intake/Output Summary (Last 24 hours) at 04/13/2023 1054 Last data filed at 04/13/2023 1610 Gross per 24 hour  Intake 1511 ml  Output 2100 ml  Net -589 ml   General: well-appearing, no acute distress HEENT: anicteric sclera CV: regular rate, normal rhythm, no  murmurs, no gallops, no rubs- though limited by body habitus, LE with possible pretibial edema (exam limited by compression stockings). Lungs: clear to auscultation bilaterally, normal work of breathing- limited by body habitus. On 4L [place]. Abd: soft, non-tender, slightly distended with edematous fluid retention. Skin: no visible lesions or rashes Psych: alert, engaged, appropriate mood and affect Musculoskeletal: no obvious deformities Neuro: normal speech, no gross focal deficits. Mentation intact to date and family member names. Negative for asterixis.  Test Results Reviewed Lab Results  Component Value Date   NA 128 (L) 04/13/2023   K 3.1 (L) 04/13/2023   CL 80 (L) 04/13/2023   CO2 33 (H) 04/13/2023   BUN 138 (H) 04/13/2023   CREATININE 1.82 (H) 04/13/2023   GFR 66.17 11/14/2020   CALCIUM 9.5 04/13/2023   ALBUMIN 3.2 (L) 04/12/2023   PHOS 4.2 07/13/2022    CBC Recent Labs  Lab 04/12/23 1231 04/12/23 1522 04/13/23 0347  WBC 11.5*  --  9.6  HGB 12.7 15.3* 12.6  HCT 38.9 45.0 38.9  MCV 86.8  --  85.7  PLT 235  --  237    I have reviewed all relevant outside healthcare records related to the patient's current hospitalization

## 2023-04-13 NOTE — Progress Notes (Signed)
   04/13/23 1405  Spiritual Encounters  Type of Visit Initial  Care provided to: Patient  Referral source Patient request  Reason for visit Advance directives  OnCall Visit No  Spiritual Framework  Presenting Themes Meaning/purpose/sources of inspiration  Values/beliefs family  Patient Stress Factors Health changes  Goals  Clinical Care Goals pt wants to go through dialises becaus she said she wants to live for her daughter  Interventions  Spiritual Care Interventions Made Reflective listening;Compassionate presence;Normalization of emotions;Decision-making support/facilitation  Intervention Outcomes  Outcomes Reduced isolation;Awareness of health   Ch responded to request for emotional and spiritual support. There was no family at bedside. Pt's has a 53 years-old daughter who is graduating [employer]. She said, she wants to stay alive for her but she doesn't want to be a burden. She loves her daughter and will do anything for her. Ch assisted pt with AD education and provided compassionate presence. Pt will page Ch when she  is ready to complete AD. No follow-up needed at this time.

## 2023-04-13 NOTE — Progress Notes (Signed)
    Durable Medical Equipment  (From admission, onward)           Start     Ordered   04/13/23 1156  For home use only DME 3 n 1  Once       Comments: Heavy duty needed due to BMI, bariatric needs   04/13/23 1156   04/13/23 1155  For home use only DME Nebulizer machine  Once       Question Answer Comment  Patient needs a nebulizer to treat with the following condition COPD (chronic obstructive pulmonary disease) ([name])   Length of Need Lifetime      04/13/23 1156   04/13/23 1154  For home use only DME Walker rolling  Once       Question Answer Comment  Walker: With 5 Inch Wheels   Patient needs a walker to treat with the following condition Generalized weakness   Patient needs a walker to treat with the following condition Hx of BKA, left ([name])      04/13/23 1156

## 2023-04-13 NOTE — Progress Notes (Signed)
Chaplain attempted to complete [facility] consult. Pt stated she another chaplain had just left and further support was not needed.  Please page as further needs arise.  Maryanna Shape. Carley Hammed, M.Div. [facility] Chaplain Pager [phone] Office [phone]

## 2023-04-13 NOTE — Inpatient Diabetes Management (Signed)
Inpatient Diabetes Program Recommendations  AACE/ADA: New Consensus Statement on Inpatient Glycemic Control (2015)  Target Ranges:  Prepandial:   less than 140 mg/dL      Peak postprandial:   less than 180 mg/dL (1-2 hours)      Critically ill patients:  140 - 180 mg/dL   Lab Results  Component Value Date   GLUCAP 526 (HH) 04/13/2023   HGBA1C 11.0 (H) 11/29/2022    Review of Glycemic Control  Diabetes history: DM 2 Outpatient Diabetes medications: Tresiba 60 units bid, Novolog 30 units tid Current orders for Inpatient glycemic control:  Semglee 60 units bid Novolog 0-20 units tid + hs Novolog 10 units tid meal coverage  A1c 11%  Inpatient Diabetes Program Recommendations:    - Consider additional one time dose of Semglee 30 units now.  -  Increase Novolog meal coverage to 20 units tid  Spoke with pt at bedside regarding her A1c level of 11% and glucose control at home. Pt reports consistent uncontrolled glucose trends at home since her last A1c check. Pt reports having a Dexcom CGM and an Omnipod insulin pump at one time but it stopped giving her insulin so she has been using [place] 60 units bid and Novolog 30 units tid meal coverage. Pt reports she sees an Actor at [facility] and will see them on the 13 th of this month. Pt reports her Endocrinologist plans on making changes at this upcoming appointment. Encouraged glucose control outpatient. Will monitor glucose trends while inpatient and determine insulin needs for discharge. Pt is very resistant, will anticipate will need high doses of insulin (pt may need concentrated Humulin R U-500 insulin at some point if glucose control cannot be achieved outpt)  Thanks,  Christena Deem RN, MSN, BC-ADM Inpatient Diabetes Coordinator Team Pager [phone] (8a-5p)

## 2023-04-13 NOTE — TOC Initial Note (Addendum)
Transition of Care [facility]) - Initial/Assessment Note    Patient Details  Name: [name] MRN: [id number] Date of Birth: [birth year]  Transition of Care [facility]) CM/SW Contact:    Janae Bridgeman, RN Phone Number: 04/13/2023, 11:47 AM  Clinical Narrative:                 CM met with the patient at the bedside to discuss TOC needs.  The patient lives in a hotel with her daughter and Medicaid provides aide during the day for [employer].  The patient has recent cocaine use 1 week ago - denies smoking, alcohol use.  The patient states that she uses cocaine for pain relief - resources provided for OP counseling.  Patient counseled regarding the health risks of cocaine use at the bedside.  The patient has DME at the hotel including home oxygen, transfer board, prosthesis, WC.  The patient states that she has a Surveyor, quantity at the hotel as well - mask at bedside.  The patient states that she needs a new nebulizer machine, RW and bariatric 3:1.  Equipment will be ordered through [employer] and shipped to the hotel - per request of patient.  The patient states that she needs help with hotel costs - resources provided at the bedside for social services to contact for assistance.   DME ordered and [employer] called to follow up regarding DME needs for RW, nebulizer, bariatric 3:1.  The patient needs PTAR home - oxygen dependent - when medically stable for discharge.  The patient is active with CAP program.  I called [employer] and spoke with Barbara Cower, CM with [employer] and patient is unable to get new nebulizer until July 2025 since she recently receives on in July 2021.  The patient has a bariatric bathseat and Power wheelchair and [employer] is also unable to bill for the requested RW and Bariatric 3:1.  I updated the patient - she is upset and states that she will call her CAP worker to assist.  MD Team aware.  Expected Discharge Plan: Home/Self Care Barriers to Discharge: Continued Medical Work up   Patient  Goals and CMS Choice Patient states their goals for this hospitalization and ongoing recovery are:: To return to hotel with daughter/aide CMS Medicare.gov Compare Post Acute Care list provided to:: Patient Choice offered to / list presented to : Patient      Expected Discharge Plan and Services   Discharge Planning Services: CM Consult Post Acute Care Choice: Durable Medical Equipment Living arrangements for the past 2 months: Hotel/Motel                 DME Arranged: 3-N-1, Nebulizer machine DME Agency: [employer] Date DME Agency Contacted: 04/13/23 Time DME Agency Contacted: 1146 Representative spoke with at DME Agency: Barbara Cower, CM with [employer]            Prior Living Arrangements/Services Living arrangements for the past 2 months: Hotel/Motel Lives with:: Adult Children Patient language and need for interpreter reviewed:: Yes Do you feel safe going back to the place where you live?: Yes      Need for Family Participation in Patient Care: Yes (Comment)   Current home services: DME (has prosthesis, transfer board, WC, Trilogy machine, oxygen through [employer] at baseline of 3-4 L/min  - needs new nebulizer machine, BSC, RW) Criminal Activity/Legal Involvement Pertinent to Current Situation/Hospitalization: No - Comment as needed  Activities of Daily Living      Permission Sought/Granted Permission sought to share information with :  Case Manager, Oceanographer granted to share information with : Yes, Verbal Permission Granted     Permission granted to share info w AGENCY: [employer] contacted for Nebulizer machine, bariatric 3:1, RW  Permission granted to share info w Relationship: daughter - Florette Colaianni     Emotional Assessment Appearance:: Appears stated age Attitude/Demeanor/Rapport: Gracious Affect (typically observed): Accepting Orientation: : Oriented to Self, Oriented to Place, Oriented to  Time, Oriented to Situation Alcohol / Substance  Use: Illicit Drugs (recent cocaine use) Psych Involvement: No (comment)  Admission diagnosis:  Uremia [N19] Hyperglycemia [R73.9] Patient Active Problem List   Diagnosis Date Noted   Uremia 04/12/2023   Urinary retention 04/12/2023   AKI (acute kidney injury) ([name]) 03/19/2023   Intertrigo 03/19/2023   Osteoarthritis of right knee 03/19/2023   Acute congestive heart failure ([name]) 02/22/2023   Acute on chronic respiratory failure with hypercapnia ([name]) 02/21/2023   Shortness of breath 02/20/2023   Acute exacerbation of CHF (congestive heart failure) ([name]) 11/28/2022   Cocaine use 11/05/2022   Cellulitis of right lower extremity 06/10/2022   Hypervolemia 06/01/2022   Peripheral artery disease, right leg, mild([name]) 05/15/2022   Osteomyelitis of foot, right, acute ([name]) 05/14/2022   Wound infection 04/09/2022   Adrenal nodule ([name]) 01/07/2022   Iritis    (HFpEF) heart failure with preserved ejection fraction ([name]) 10/19/2021   Impaired mobility 07/07/2021   Chest pain 05/11/2021   Hyperlipidemia associated with type 2 diabetes mellitus ([name]) 01/15/2021   Hypertension associated with type 2 diabetes mellitus ([name]) 01/15/2021   Nonobstructive atherosclerosis of coronary artery 11/26/2020   Urinary incontinence 10/23/2020   Chronic respiratory failure with hypoxia ([name]) 06/14/2020   Constipation 04/03/2020   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[name]) 04/11/2019   Type 2 diabetes mellitus with hyperglycemia ([name]) 04/11/2019   S/P transmetatarsal amputation of foot, right ([name]) 01/25/2019   Hypokalemia    Allergic rhinitis 03/06/2018   Class 3 severe obesity due to excess calories with serious comorbidity and body mass index (BMI) of 50.0 to 59.9 in adult [facility])    Gout of knee, Right 10/22/2016   Vitamin D deficiency 09/05/2015   Recurrent candidiasis of vagina 09/05/2015   Varicose veins of leg with complications 06/11/2015   Restless leg syndrome 10/17/2014   Chronic sinusitis 07/18/2014    Insomnia 08/14/2013   Encounter for chronic pain management 09/01/2011   Obesity hypoventilation syndrome ([name]) 05/22/2008   Mood disorder ([name]) 05/22/2008   Obstructive sleep apnea (adult) (pediatric) 05/22/2008   Hypertension 05/22/2008   Asthma, chronic 05/22/2008   GERD 05/22/2008   Type 2 diabetes mellitus with diabetic neuropathy, unspecified ([name]) 05/22/2008   PCP:  Latrelle Dodrill, MD Pharmacy:   [facility] [place], [place] - [address] [place] [address] Phone: [phone] Fax: [phone]  [name] [address] [place] [address] Phone: [phone] Fax: [phone]     Social Determinants of Health (SDOH) Social History: SDOH Screenings   Food Insecurity: No Food Insecurity (03/22/2023)  Housing: High Risk (03/22/2023)  Transportation Needs: No Transportation Needs (03/22/2023)  Utilities: Not At Risk (03/22/2023)  Alcohol Screen: Low Risk  (10/10/2017)  Depression (PHQ2-9): Medium Risk (03/07/2023)  Financial Resource Strain: High Risk (02/19/2021)  Stress: Stress Concern Present (02/19/2021)  Tobacco Use: Medium Risk (04/12/2023)   SDOH Interventions:     Readmission Risk Interventions    03/22/2023   10:47 AM 05/24/2022   11:33 AM  Readmission  Risk Prevention Plan  Transportation Screening Complete Complete  Medication Review Oceanographer) Complete Complete  PCP or Specialist appointment within 3-5 days of discharge Complete Complete  HRI or Home Care Consult Complete Complete  SW Recovery Care/Counseling Consult Complete Complete  Palliative Care Screening Not Applicable Not Applicable  Skilled Nursing Facility Not Applicable Patient Refused

## 2023-04-13 NOTE — Hospital Course (Addendum)
[name] is a 47 y.o.female with a history of CKD, type 2 DM, and PVD who was admitted to the [facility] Medicine Teaching Service at [facility] for uremia. Her hospital course is detailed below:  Uremia Urinary retention Outpatient lab work revealed BUN 139, patient admitted for further work up and found to have persistently elevated BUN at 138. Patient reporting nausea and tremors, unclear timeline of onset. Nephrology consulted and recommended renal monitoring and electrolyte repletion. Patient tolerated this well and had improvement of symptoms prior to disposition. Per Nephrology, will continue to monitor BUN outpatient.  Hypervolemia H/o HF, difficult to assess volume status, but patient reports increased peripheral edema. Likely HF exacerbated in the setting of recent cocaine use. Diuresed with IV Lasix 80mg  daily with good UOP. Kidney function remained stable throughout. Appeared around baseline volume status at the time of discharge. Discharged on home diuretic regimen.   T2DM Glucose over 600 upon admission, h/o very insulin resistant DM. Treated with home regimen Semglee 60U BID and Novolog 20U TID in addition to resistant SSI with modest control. A1c 13.6. Increase LAI to 70U BID to be continued on discharge.   Other chronic conditions were medically managed with home medications and formulary alternatives as necessary (neuropathy, HTN, COPD, OSA, mood disorder, glaucoma)  PCP Follow-up Recommendations:  LAI increased to 70U BID, monitor BGL Ensure Nephrology outpatient follow up Repeat BMP Encourage cocaine cessation  Patient has VERY uncontrolled CBGs>CGM candidate??/pharmacy appt

## 2023-04-13 NOTE — TOC CAGE-AID Note (Addendum)
Transition of Care [facility]) - CAGE-AID Screening   Patient Details  Name: [name] MRN: [id number] Date of Birth: 10-31-1976  Transition of Care [facility]) CM/SW Contact:    Janae Bridgeman, RN Phone Number: 04/13/2023, 11:53 AM   Clinical Narrative: Patient with recent use of cocaine - resources provided at the bedside for OP counseling for substance abuse.   CAGE-AID Screening:    Have You Ever Felt You Ought to Cut Down on Your Drinking or Drug Use?: No Have People Annoyed You By Critizing Your Drinking Or Drug Use?: No Have You Felt Bad Or Guilty About Your Drinking Or Drug Use?: No Have You Ever Had a Drink or Used Drugs First Thing In The Morning to Steady Your Nerves or to Get Rid of a Hangover?: No CAGE-AID Score: 0  Substance Abuse Education Offered: Yes  Substance abuse interventions: Patient Counseling, Transport planner

## 2023-04-13 NOTE — Progress Notes (Signed)
RT went to place patient on CPAP.  Patient awake watching TV and does not want CPAP on right now.  Instructed patient to have RN call RT when ready.  Patient showed understanding.

## 2023-04-13 NOTE — Progress Notes (Addendum)
Daily Progress Note Intern Pager: [phone]  Patient name: [name] Medical record number: [id number] Date of birth: Jul 19, 1976 Age: 47 y.o. Gender: female  Primary Care Provider: Latrelle Dodrill, MD Consultants: Nephrology Code Status: Full code  Pt Overview and Major Events to Date:  5/7: Admitted to FMTS  Assessment and Plan: [name] is a 47 y.o. female presenting with elevated BUN and hypervolemia.   * Uremia Aox4 this AM. BUN 138, stable with prior days. Possibly secondary to renal injury, but unclear etiology. Appropriate UOP and likely not retaining. Renal US unremarkable. Pending Nephrology input. Reporting nausea this AM concerning for uremia, not improved with Compezine. Will give one time dose Zofran given borderline QT prolongation. -Nephrology consulted, appreciate recs -F/u urine studies - urea, creatinine, sodium -Avoid unnecessary nephrotoxic agents -Neuro checks q4h -Zofran 4mg  IV once -Cardiac tele  Urinary retention H/o urinary retention in prior admission. Foley placed with only output, likely not retaining currently. Difficult to obtain bladder scans due to body habitus, will maintain Foley to accurately track UOP. Had around 700cc yellow urine in bag this AM -Continue Foley, will likely d/c 5/9 or 5/10  Hypervolemia Likely in the setting of non-compliance with HF medications. 2.1L UOP with IV Lasix. Home regimen Torsemide 100mg  BID. Currently in 4L [place], reports using 3-4L at home. -Redose IV Lasix 80mg  -Strict I&Os and daily weights  Hypokalemia K 3.1 this AM. Repleted with 40 meq PO. -PM BMP  Type 2 diabetes mellitus with diabetic neuropathy, unspecified ([name]) BGL elevated to 400s. Received 40U LAI overnight. Home regimen of Tresiba 60U BID and Novolog 20U TID. -Increase to Semglee 45U BID -Add Novolog 10U TID -rSSI + qhs coverage   Chronic medical conditions Neuropathy: Renally adjust Gabapentin to 600mg  TID HTN: hold BP  meds in setting of hypotension COPD: Continue home inhalers OSA: CPAP QHS Mood disorder: Continue home meds Glaucoma: Continue eye drops  FEN/GI: Carb modified PPx: Lovenox Dispo: Home pending Nephrology evaluation  Subjective:  Patient assessed at bedside, sitting at edge of bed. Reports frustration about being in the hospital and wants to know the plan. Normal WOB and feels her edema has improved.  Objective: Temp:  [97.7 F (36.5 C)-98.3 F (36.8 C)] 97.8 F (36.6 C) (05/08 0805) Pulse Rate:  [83-97] 87 (05/08 1123) Resp:  [16-22] 16 (05/08 0805) BP: (119-162)/(46-117) 133/70 (05/08 1123) SpO2:  [93 %-99 %] 99 % (05/08 0805) FiO2 (%):  [36 %] 36 % (05/07 2338) Physical Exam: General: Obese female sitting at edge of bed, NAD Cardiovascular: RRR Respiratory: Breath sounds limited by body habitus. Normal WOB on 4L [place] Abdomen: Soft, non-tender Extremities: L BKA. Compression stocking on. Mild peripheral edema  Laboratory: Most recent CBC Lab Results  Component Value Date   WBC 9.6 04/13/2023   HGB 12.6 04/13/2023   HCT 38.9 04/13/2023   MCV 85.7 04/13/2023   PLT 237 04/13/2023   Most recent BMP    Latest Ref Rng & Units 04/13/2023    3:47 AM  BMP  Glucose 70 - 99 mg/dL 621   BUN 6 - 20 mg/dL 308   Creatinine 6.57 - 1.00 mg/dL 8.46   Sodium 962 - 952 mmol/L 128   Potassium 3.5 - 5.1 mmol/L 3.1   Chloride 98 - 111 mmol/L 80   CO2 22 - 32 mmol/L 33   Calcium 8.9 - 10.3 mg/dL 9.5     Other pertinent labs: UDS: +cocaine Glucose: 442 Mag: 2.4  Imaging/Diagnostic  Tests: US RENAL Result Date: 04/12/2023 IMPRESSION: No collecting system dilatation.  Contracted urinary bladder. Incidental note made of gallstones.  DG Chest 2 View Result Date: 04/12/2023 IMPRESSION: No active cardiopulmonary disease.    Elberta Fortis, MD 04/13/2023, 12:07 PM  PGY-1, [facility] Family Medicine FPTS Intern pager: [phone], text pages welcome Secure chat group [facility] Teaching Service

## 2023-04-14 ENCOUNTER — Encounter (HOSPITAL_COMMUNITY): Payer: Self-pay | Admitting: Student

## 2023-04-14 ENCOUNTER — Telehealth: Payer: Self-pay | Admitting: Licensed Clinical Social Worker

## 2023-04-14 ENCOUNTER — Other Ambulatory Visit (HOSPITAL_COMMUNITY): Payer: Self-pay

## 2023-04-14 DIAGNOSIS — N19 Unspecified kidney failure: Secondary | ICD-10-CM | POA: Diagnosis not present

## 2023-04-14 DIAGNOSIS — R339 Retention of urine, unspecified: Secondary | ICD-10-CM | POA: Diagnosis not present

## 2023-04-14 DIAGNOSIS — E1140 Type 2 diabetes mellitus with diabetic neuropathy, unspecified: Secondary | ICD-10-CM

## 2023-04-14 DIAGNOSIS — Z794 Long term (current) use of insulin: Secondary | ICD-10-CM

## 2023-04-14 DIAGNOSIS — R531 Weakness: Secondary | ICD-10-CM | POA: Diagnosis not present

## 2023-04-14 DIAGNOSIS — Z7401 Bed confinement status: Secondary | ICD-10-CM | POA: Diagnosis not present

## 2023-04-14 LAB — CBC
HCT: 37.5 % (ref 36.0–46.0)
Hemoglobin: 12.4 g/dL (ref 12.0–15.0)
MCH: 27.9 pg (ref 26.0–34.0)
MCHC: 33.1 g/dL (ref 30.0–36.0)
MCV: 84.3 fL (ref 80.0–100.0)
Platelets: 250 10*3/uL (ref 150–400)
RBC: 4.45 MIL/uL (ref 3.87–5.11)
RDW: 15.2 % (ref 11.5–15.5)
WBC: 9 10*3/uL (ref 4.0–10.5)
nRBC: 0 % (ref 0.0–0.2)

## 2023-04-14 LAB — HEMOGLOBIN A1C
Hgb A1c MFr Bld: 13.6 % — ABNORMAL HIGH (ref 4.8–5.6)
Mean Plasma Glucose: 344 mg/dL

## 2023-04-14 LAB — BASIC METABOLIC PANEL
Anion gap: 13 (ref 5–15)
BUN: 136 mg/dL — ABNORMAL HIGH (ref 6–20)
CO2: 33 mmol/L — ABNORMAL HIGH (ref 22–32)
Calcium: 9.4 mg/dL (ref 8.9–10.3)
Chloride: 85 mmol/L — ABNORMAL LOW (ref 98–111)
Creatinine, Ser: 1.95 mg/dL — ABNORMAL HIGH (ref 0.44–1.00)
GFR, Estimated: 32 mL/min — ABNORMAL LOW (ref >60)
Glucose, Bld: 400 mg/dL — ABNORMAL HIGH (ref 70–99)
Potassium: 3.4 mmol/L — ABNORMAL LOW (ref 3.5–5.1)
Sodium: 131 mmol/L — ABNORMAL LOW (ref 135–145)

## 2023-04-14 LAB — UREA NITROGEN, URINE: Urea Nitrogen, Ur: 520 mg/dL

## 2023-04-14 LAB — GLUCOSE, CAPILLARY
Glucose-Capillary: 372 mg/dL — ABNORMAL HIGH (ref 70–99)
Glucose-Capillary: 549 mg/dL (ref 70–99)
Glucose-Capillary: 405 mg/dL — ABNORMAL HIGH (ref 70–99)

## 2023-04-14 MED ORDER — INSULIN GLARGINE-YFGN 100 UNIT/ML ~~LOC~~ SOLN
70.0000 [IU] | Freq: Two times a day (BID) | SUBCUTANEOUS | Status: DC
  Filled 2023-04-14: qty 0.7

## 2023-04-14 MED ORDER — METOLAZONE 2.5 MG PO TABS
2.5000 mg | ORAL_TABLET | Freq: Every day | ORAL | 0 refills | Status: DC
  Filled 2023-04-14: qty 30, 30d supply, fill #0

## 2023-04-14 MED ORDER — GABAPENTIN 300 MG PO CAPS
600.0000 mg | ORAL_CAPSULE | Freq: Three times a day (TID) | ORAL | 0 refills | Status: DC
  Filled 2023-04-14: qty 180, 30d supply, fill #0

## 2023-04-14 MED ORDER — TRESIBA FLEXTOUCH 200 UNIT/ML ~~LOC~~ SOPN: 70.0000 [IU] | PEN_INJECTOR | Freq: Two times a day (BID) | SUBCUTANEOUS | 3 refills | Status: DC

## 2023-04-14 MED ORDER — ROSUVASTATIN CALCIUM 10 MG PO TABS
10.0000 mg | ORAL_TABLET | Freq: Every day | ORAL | 0 refills | Status: DC
  Filled 2023-04-14: qty 30, 30d supply, fill #0

## 2023-04-14 NOTE — Inpatient Diabetes Management (Signed)
Inpatient Diabetes Program Recommendations  AACE/ADA: New Consensus Statement on Inpatient Glycemic Control (2015)  Target Ranges:  Prepandial:   less than 140 mg/dL      Peak postprandial:   less than 180 mg/dL (1-2 hours)      Critically ill patients:  140 - 180 mg/dL   Lab Results  Component Value Date   GLUCAP 549 (HH) 04/14/2023   HGBA1C 13.6 (H) 04/13/2023    Review of Glycemic Control  Latest Reference Range & Units 04/13/23 08:05 04/13/23 11:28 04/13/23 16:30 04/13/23 22:55 04/14/23 07:18 04/14/23 11:51  Glucose-Capillary 70 - 99 mg/dL 782 (H) 956 (HH) 213 (H) 367 (H) 372 (H) 549 (HH)   Diabetes history: DM 2 Outpatient Diabetes medications: Tresiba 60 units bid, Novolog 30 units tid Current orders for Inpatient glycemic control:  Semglee 70 units bid Novolog 0-20 units tid + hs Novolog 20 units tid meal coverage  A1c 11%  Inpatient Diabetes Program Recommendations:    Note increase in Semglee 70 units bid today  -  Increase Novolog meal coverage to 30 units tid (home dose)  Thanks,  Christena Deem RN, MSN, BC-ADM Inpatient Diabetes Coordinator Team Pager [phone] (8a-5p)

## 2023-04-14 NOTE — Telephone Encounter (Signed)
H&V Care Navigation CSW Progress Note  Clinical Social Worker  received a call from pt  asking for assistance with cost of her long term hotel room. Pt shared another [facility] pt had given her my number and said we could help. I clarified that I would need to review her information as she hasn't seen a provider from [facility] recently/doesn't have any upcoming appts and review request with our team lead Annice Pih. I shared that we generally request that pts go through clinic to connect with care navigation team rather than calling from community to ensure Heart/Vascular providers are currently involved in care. I will also going to reach out to [place], [facility] at hospital to see if any further clarity on situation. Appears she provided resources to pt as well for assistance.   Patient is participating in a Managed Medicaid Plan:  No, Medicaid [place] Access  SDOH Screenings   Food Insecurity: No Food Insecurity (03/22/2023)  Housing: High Risk (03/22/2023)  Transportation Needs: No Transportation Needs (03/22/2023)  Utilities: Not At Risk (03/22/2023)  Alcohol Screen: Low Risk  (10/10/2017)  Depression (PHQ2-9): Medium Risk (03/07/2023)  Financial Resource Strain: High Risk (02/19/2021)  Stress: Stress Concern Present (02/19/2021)  Tobacco Use: Medium Risk (04/12/2023)   Octavio Graves, MSW, LCSW Clinical Social Worker II [facility] Heart/Vascular Care Navigation  [phone]- work cell phone (preferred) [phone]- desk phone

## 2023-04-14 NOTE — Progress Notes (Signed)
     Daily Progress Note Intern Pager: [phone]  Patient name: [name] Medical record number: [id number] Date of birth: [birth year] Age: 47 y.o. Gender: female  Primary Care Provider: Latrelle Dodrill, MD Consultants: Nephrology Code Status: Full code   Pt Overview and Major Events to Date:  5/7: Admitted to FMTS   Assessment and Plan: [name] is a 47 y.o. female presenting with elevated BUN and hypervolemia. PMHx includes chronic respiratory failure with hypoxia, obesity hypoventilation syndrome, type 2 diabetes uncontrolled, HFpEF, peripheral neuropathy, chronic pain, left BKA and right TMA, asthma, hypertension, GERD, restless leg syndrome, hyperlipidemia, CAD, PAD, cocaine use disorder.  * Uremia BUN 136, stable from prior day, likely not secondary to urinary retention given Foley in place during admission without lab change. Urine studies back, pending updated Nephro evaluation. -Nephrology consulted, appreciate recs -Avoid unnecessary nephrotoxic agents -Cardiac tele  Urinary retention Remove Foley catheter. Observe UOP.  Hypervolemia 800cc UOP recorded and 600cc in foley bag this AM. Reports feeling like she is at baseline volume status. -Strict I&Os and daily weights  Hypokalemia K 3.4. Repleted with PO  Type 2 diabetes mellitus with diabetic neuropathy, unspecified ([name]) Improving BGL to 300s. Receiving max dose 20U of SSI. -Increase to Semglee 70U BID -Increase to Novolog 20U TID -rSSI + qhs coverage   Chronic medical conditions Neuropathy: Renally adjust Gabapentin to 600mg  TID HTN: hold BP meds in setting of hypotension COPD: Continue home inhalers OSA: CPAP QHS Mood disorder: Continue home meds Glaucoma: Continue eye drops   FEN/GI: Carb modified PPx: Lovenox Dispo: Home pending Nephrology evaluation  Subjective:  Patient assessed at bedside, sitting up at the edge of the bed. Reports feeling like her nausea resolved and otherwise  feels well. No SOB or chest pain.  Objective: Temp:  [97.5 F (36.4 C)-97.6 F (36.4 C)] 97.5 F (36.4 C) (05/09 0814) Pulse Rate:  [87-101] 101 (05/09 0814) Resp:  [14-18] 18 (05/09 0814) BP: (106-133)/(70-82) 106/72 (05/09 0814) SpO2:  [91 %-99 %] 91 % (05/09 0814) Physical Exam: General: Obese female sitting at edge of bed, NAD Cardiovascular: RRR Respiratory: Breath sounds limited by body habitus. Normal WOB on 4L [place] Abdomen: Soft, non-tender Extremities: L BKA. Compression stocking on. Mild peripheral edema  Laboratory: Most recent CBC Lab Results  Component Value Date   WBC 9.0 04/14/2023   HGB 12.4 04/14/2023   HCT 37.5 04/14/2023   MCV 84.3 04/14/2023   PLT 250 04/14/2023   Most recent BMP    Latest Ref Rng & Units 04/14/2023    8:10 AM  BMP  Glucose 70 - 99 mg/dL 147   BUN 6 - 20 mg/dL 829   Creatinine 5.62 - 1.00 mg/dL 1.30   Sodium 865 - 784 mmol/L 131   Potassium 3.5 - 5.1 mmol/L 3.4   Chloride 98 - 111 mmol/L 85   CO2 22 - 32 mmol/L 33   Calcium 8.9 - 10.3 mg/dL 9.4     Other pertinent labs: A1c: 13.6  Elberta Fortis, MD 04/14/2023, 9:40 AM  PGY-1, [place] [facility] FPTS Intern pager: [phone], text pages welcome Secure chat group [facility] [place] [facility] Teaching Service

## 2023-04-14 NOTE — Progress Notes (Signed)
Came to room for inhaler, currently pt wants to wait to take treatment.  No distress noted.

## 2023-04-14 NOTE — Discharge Summary (Signed)
Family Medicine Teaching [facility] Discharge Summary  Patient name: [name] Medical record number: [id number] Date of birth: May 09, 1976 Age: 47 y.o. Gender: female Date of Admission: 04/12/2023  Date of Discharge: 04/14/23 Admitting Physician: Tiffany Kocher, DO  Primary Care Provider: Latrelle Dodrill, MD Consultants: Nephrology  Indication for Hospitalization: Uremia  Discharge Diagnoses/Problem List:  Principal Problem for Admission: Uremia Other Problems addressed during stay:  Principal Problem:   Uremia Active Problems:   Urinary retention   Hypervolemia   Hypokalemia   Type 2 diabetes mellitus with diabetic neuropathy, unspecified [facility])  Brief Hospital Course:  [name] is a 47 y.o.female with a history of CKD, type 2 DM, and PVD who was admitted to the [facility] Medicine Teaching Service at [facility] for uremia. Her hospital course is detailed below:  Uremia Urinary retention Outpatient lab work revealed BUN 139, patient admitted for further work up and found to have persistently elevated BUN at 138. Patient reporting nausea and tremors, unclear timeline of onset. Nephrology consulted and recommended renal monitoring and electrolyte repletion. Patient tolerated this well and had improvement of symptoms prior to disposition. Per Nephrology, will continue to monitor BUN outpatient.  Hypervolemia H/o HF, difficult to assess volume status, but patient reports increased peripheral edema. Likely HF exacerbated in the setting of recent cocaine use. Diuresed with IV Lasix 80mg  daily with good UOP. Kidney function remained stable throughout. Appeared around baseline volume status at the time of discharge. Discharged on home diuretic regimen.   T2DM Glucose over 600 upon admission, h/o very insulin resistant DM. Treated with home regimen Semglee 60U BID and Novolog 20U TID in addition to resistant SSI with modest control. A1c 13.6. Increase LAI to 70U BID to be continued on  discharge.   Other chronic conditions were medically managed with home medications and formulary alternatives as necessary (neuropathy, HTN, COPD, OSA, mood disorder, glaucoma)  PCP Follow-up Recommendations:  LAI increased to 70U BID, monitor BGL Ensure Nephrology outpatient follow up Repeat BMP Encourage cocaine cessation  Patient has VERY uncontrolled CBGs>CGM candidate??/pharmacy appt   Disposition: Stable  Discharge Condition: Home   Discharge Exam:  Vitals:   04/14/23 0814 04/14/23 1239  BP: 106/72 114/73  Pulse: (!) 101 97  Resp: 18 18  Temp: (!) 97.5 F (36.4 C) 98.5 F (36.9 C)  SpO2: 91% 99%   General: Obese female sitting at edge of bed, NAD Cardiovascular: RRR Respiratory: Breath sounds limited by body habitus. Normal WOB on 4L [place] Abdomen: Soft, non-tender Extremities: L BKA. Compression stocking on. Mild peripheral edema  Significant Procedures: None  Significant Labs and Imaging:  Recent Labs  Lab 04/12/23 1522 04/13/23 0347 04/14/23 0810  WBC  --  9.6 9.0  HGB 15.3* 12.6 12.4  HCT 45.0 38.9 37.5  PLT  --  237 250   Recent Labs  Lab 04/12/23 1522 04/12/23 1846 04/13/23 0347 04/13/23 1256 04/14/23 0810  NA 130*  --  128* 129* 131*  K 3.9  --  3.1* 3.5 3.4*  CL  --   --  80* 79* 85*  CO2  --   --  33* 33* 33*  GLUCOSE  --   --  377* 481* 400*  BUN  --   --  138* 134* 136*  CREATININE  --   --  1.82* 1.85* 1.95*  CALCIUM  --   --  9.5 9.6 9.4  MG  --  2.4  --   --   --   ALKPHOS  --   --   --  133*  --   AST  --   --   --  35  --   ALT  --   --   --  38  --   ALBUMIN  --   --   --  3.2*  --     Pertinent Imaging: US RENAL Result Date: 04/12/2023 IMPRESSION: No collecting system dilatation.  Contracted urinary bladder. Incidental note made of gallstones.   DG Chest 2 View Result Date: 04/12/2023 IMPRESSION: No active cardiopulmonary disease.    Results/Tests Pending at Time of Discharge: None  Discharge Medications:  Allergies as  of 04/14/2023       Reactions   Cefepime Other (See Comments)   AKI, see records from [facility] hospitalization in January 2020.   Other reaction(s): Other (See Comments) "Shut down organs/kidneys" Note pt has tolerated Rocephin and Keflex Other reaction(s): Not available   Iodine Other (See Comments)   Kidney dysfunction   Kiwi Extract Shortness Of Breath, Swelling, Anaphylaxis   Other reaction(s): Not available   Morphine And Related Nausea And Vomiting   Pentoxifylline Nausea And Vomiting   Trental   Cephalosporins Other (See Comments)   Pt states that they cause her kidneys to shut down   Toradol [ketorolac Tromethamine] Other (See Comments)   Feels like something is crawling on me   Nubain [nalbuphine Hcl] Other (See Comments)   "FEELS LIKE SOMETHING CRAWLING ON ME" Hallucinations         Medication List     STOP taking these medications    gabapentin 600 MG tablet Commonly known as: NEURONTIN Replaced by: gabapentin 300 MG capsule   ondansetron 4 MG tablet Commonly known as: Zofran       TAKE these medications    acetaminophen 325 MG tablet Commonly known as: TYLENOL Take 1-2 tablets (325-650 mg total) by mouth every 4 (four) hours as needed for mild pain.   albuterol 108 (90 Base) MCG/ACT inhaler Commonly known as: Ventolin HFA Inhale 2 puffs into the lungs every 6 (six) hours as needed for wheezing or shortness of breath.   amLODipine 10 MG tablet Commonly known as: NORVASC Take 1 tablet (10 mg total) by mouth daily.   aspirin EC 81 MG tablet Take 1 tablet (81 mg total) by mouth daily. Swallow whole.   budesonide-formoterol 160-4.5 MCG/ACT inhaler Commonly known as: Symbicort Inhale 2 puffs into the lungs 2 (two) times daily.   cetirizine 10 MG tablet Commonly known as: ZYRTEC Take 1 tablet (10 mg total) by mouth daily as needed for allergies.   Dexcom G6 Sensor Misc Inject 1 applicator into the skin as directed. Change sensor every 10 days.    Dexcom G6 Transmitter Misc Inject 1 Device into the skin as directed. Reuse 8 times with sensor changes.   diclofenac Sodium 1 % Gel Commonly known as: Voltaren Apply 2 g topically 4 (four) times daily.   DULoxetine 60 MG capsule Commonly known as: CYMBALTA TAKE 2 CAPSULES BY MOUTH DAILY   gabapentin 300 MG capsule Commonly known as: NEURONTIN Take 2 capsules (600 mg total) by mouth 3 (three) times daily. Replaces: gabapentin 600 MG tablet   GNP UltiCare Pen Needles 32G X 4 MM Misc Generic drug: Insulin Pen Needle USE TO inject insulin 2 TIMES DAILY   ipratropium-albuterol 0.5-2.5 (3) MG/3ML Soln Commonly known as: DUONEB Take 3 mLs by nebulization every 6 (six) hours as needed. What changed: reasons to take this   latanoprost 0.005 % ophthalmic solution Commonly known  as: XALATAN Place 1 drop into both eyes daily.   lidocaine 5 % Commonly known as: LIDODERM Place 1 patch onto the skin daily. Remove & Discard patch within 12 hours or as directed by MD   metolazone 2.5 MG tablet Commonly known as: ZAROXOLYN Take 1 tablet (2.5 mg total) by mouth daily.   nitroGLYCERIN 0.4 MG SL tablet Commonly known as: NITROSTAT Place 1 tablet (0.4 mg total) under the tongue every 5 (five) minutes as needed for chest pain.   NovoLOG FlexPen 100 UNIT/ML FlexPen Generic drug: insulin aspart Inject 20 Units into the skin 3 (three) times daily with meals. What changed: how much to take   nystatin powder Commonly known as: MYCOSTATIN/NYSTOP Apply 1 Application topically 3 (three) times daily.   omeprazole 40 MG capsule Commonly known as: PRILOSEC Take 1 capsule (40 mg total) by mouth in the morning and at bedtime.   Omnipod 5 G6 Pods (Gen 5) Misc Inject into the skin daily.   One-A-Day Womens tablet Take 1 tablet by mouth daily.   Rhopressa 0.02 % Soln Generic drug: Netarsudil Dimesylate Place 1 drop into the left eye daily.   rOPINIRole 1 MG tablet Commonly known as:  REQUIP Take 1 tablet (1 mg total) by mouth at bedtime.   rosuvastatin 10 MG tablet Commonly known as: CRESTOR Take 1 tablet (10 mg total) by mouth daily. What changed:  medication strength how much to take   Simbrinza 1-0.2 % Susp Generic drug: Brinzolamide-Brimonidine Place 1 drop into the left eye in the morning, at noon, and at bedtime.   torsemide 100 MG tablet Commonly known as: DEMADEX Take 1 tablet (100 mg total) by mouth 2 (two) times daily.   traZODone 100 MG tablet Commonly known as: DESYREL TAKE 1 TABLET BY MOUTH AT BEDTIME AS NEEDED FOR SLEEP   Tresiba FlexTouch 200 UNIT/ML FlexTouch Pen Generic drug: insulin degludec Inject 70 Units into the skin 2 (two) times daily. What changed: how much to take   Vitamin D (Cholecalciferol) 25 MCG (1000 UT) Caps Take 1,000 Units by mouth daily.               Durable Medical Equipment  (From admission, onward)           Start     Ordered   04/13/23 1156  For home use only DME 3 n 1  Once       Comments: Heavy duty needed due to BMI, bariatric needs   04/13/23 1156   04/13/23 1155  For home use only DME Nebulizer machine  Once       Question Answer Comment  Patient needs a nebulizer to treat with the following condition COPD (chronic obstructive pulmonary disease) ([name])   Length of Need Lifetime      04/13/23 1156   04/13/23 1154  For home use only DME Walker rolling  Once       Question Answer Comment  Walker: With 5 Inch Wheels   Patient needs a walker to treat with the following condition Generalized weakness   Patient needs a walker to treat with the following condition Hx of BKA, left ([name])      04/13/23 1156              Discharge Care Instructions  (From admission, onward)           Start     Ordered   04/14/23 0000  Discharge wound care:       Comments: Keep  clean and dry   04/14/23 1331            Discharge Instructions: Please refer to Patient Instructions section of EMR  for full details.  Patient was counseled important signs and symptoms that should prompt return to medical care, changes in medications, dietary instructions, activity restrictions, and follow up appointments.   Follow-Up Appointments:  Follow-up Information     [facility] [employer] Follow up.   Why: Please call [employer] to determine if they are able to repair damaged equipment at home.  Call the above listed number for assistance. Contact information: [address] [place] [address] [phone]         Latrelle Dodrill, MD Follow up.   Specialty: Family Medicine Contact information: [address] [place] [address] [phone]         Alicia Amel, MD. Go on 04/14/2023.   Specialty: Family Medicine Contact information: [address] [place] [address] [phone]                 Elberta Fortis PGY1 04/14/2023, 1:35 PM PGY-1, [facility]    Upper Level Addendum:  I have seen and evaluated this patient along with Dr. Ardyth Harps and reviewed the above note, making necessary revisions as appropriate.  I agree with the medical decision making and physical exam as noted above.  Alfredo Martinez, MD PGY-2 [facility] Family Medicine Residency

## 2023-04-14 NOTE — Progress Notes (Signed)
   04/14/23 1500  Spiritual Encounters  Type of Visit Follow up  Care provided to: Patient  Referral source Patient request  Reason for visit Advance directives  OnCall Visit No   Ch followed with pt. There was no family at bedside. Ch assisted pt in completing AD. No follow-up needed at this time.

## 2023-04-14 NOTE — Discharge Instructions (Addendum)
Dear [name],   Thank you for letting us participate in your care! In this section, you will find a brief hospital admission summary of why you were admitted to the hospital, what happened during your admission, your diagnosis/diagnoses, and recommended follow up.  Primary diagnosis: Uremia  Treatment plan: You were treated with medication to remove excess fluid and seen by the Nephrologist. Nephrology will contact you about follow up We will increase your insulin dosage as well, please see medications list for increased dose   POST-HOSPITAL & CARE INSTRUCTIONS We recommend following up with your PCP within 1 week from being discharged from the hospital. Please let PCP/Specialists know of any changes in medications that were made which you will be able to see in the medications section of this packet. Please also follow up with Nephrology  DOCTOR'S APPOINTMENTS & FOLLOW UP Future Appointments  Date Time Provider Department Center  05/23/2023 11:10 AM Latrelle Dodrill, MD [facility]     Thank you for choosing [facility]! Take care and be well!  Family Medicine Teaching Service Inpatient Team [place]  [facility]  [address] [place], [place] [address] [phone]

## 2023-04-14 NOTE — Progress Notes (Signed)
Heart and Vascular Care Navigation  04/14/2023  [name] [birth year] [id number]  Reason for Referral: pt requesting assistance with financial assistance Patient is participating in a Managed Medicaid Plan: No, Medicaid [place] Access.  Engaged with patient by telephone for initial visit for Heart and Vascular Care Coordination.                                                                                                   Assessment:            LCSW completed chart review. Noted she has last seen Dr. Excell Seltzer at [facility] in Sept 2023, was referred to HF clinic and has also been referred previously to our Hrt/Vas clinc but declined both times. I returned call to pt after confirming resources given by [facility] team, and previous team notes.    Confirmed current home address, PCP at [facility]. She lives with her 45 yo daughter, she denies any issues obtaining or affording her medications- has them delivered by [employer]. She receives [employer], Social Security disability and another payment "for her daughter." While in the hospital she has friends and neighbors checking on her child. She shares that she uses most her income on the stay at the motel $900 monthly or $275/week. Pt asking for assistance with room costs. We discussed contacting resources given by [facility] and Coordinated Entry line provided by this writer first to see if they have any assistance available. Pt currently is enrolled in programs through DSS including PCS services and transportation (SCAT and Medicaid transport). I also shared that with information provided I would submit to our team lead tomorrow for consideration for the Patient Care Fund- she will return tomorrow 5/10.                            I shared our team will try and see if we can get her connected back with HF team for [facility] appt post hospital stay. No additional questions at this time.   HRT/VAS Care Coordination     Patients Home Cardiology Office  [facility]   Outpatient Care Team Social Worker   Social Worker Name: Octavio Graves, [place], [phone]   Living arrangements for the past 2 months Hotel/Motel   Lives with: Minor Children   Patient Current Insurance Coverage Medicaid   Patient Has Concern With Paying Medical Bills No   Does Patient Have Prescription Coverage? Yes   Home Assistive Devices/Equipment BIPAP; Oxygen; Wheelchair; Medical laboratory scientific officer (specify quad or straight)   DME Agency [employer]   HH Agency NA   Current home services DME  has prosthesis, transfer board, WC, Trilogy machine, oxygen through [employer] at baseline of 3-4 L/min [place] - needs new nebulizer machine, BSC, RW       Social History:  SDOH Screenings   Food Insecurity: No Food Insecurity (04/14/2023)  Housing: Medium Risk (04/14/2023)  Transportation Needs: No Transportation Needs (04/14/2023)  Utilities: Not At Risk (04/14/2023)  Alcohol Screen: Low Risk  (04/14/2023)  Depression (PHQ2-9): Medium Risk (03/07/2023)  Financial Resource Strain: Medium Risk (04/14/2023)  Stress: Stress Concern Present (02/19/2021)  Tobacco Use: Medium Risk (04/14/2023)    SDOH Interventions: Financial Resources:  Financial Strain Interventions: Other (Comment) (whole check goes for stay at hotel, pt does recieve SNAP- we discussed community resources)  Food Insecurity:  Food Insecurity Interventions: Intervention Not Indicated (pt recieves ~ $500/mo)  Housing Insecurity:  Housing Interventions: Other (Comment) (pt provided DSS contact, Coordinated Entry line, we discussed referral for Patient Care Fund)  Transportation:   Transportation Interventions: Intervention Not Indicated    Other Care Navigation Interventions:     Provided Pharmacy assistance resources  Pt denies any issues generally obtaining or affording her medications- has them delivered from [employer] on [address]     Follow-up plan:    LCSW has discussed case with team at HF clinic to see if we can re-offer pt an appt with [facility] clinic given multiple admissions with related HF challenges. My colleague Eileen Stanford will bring pt a scale, she has been given several community resources to contact for rental assistance and we will also f/u with our team lead for consideration for Patient Care Fund Assistance. I let her know that our team will continue to be in touch with any assistance we may be able to provide.

## 2023-04-14 NOTE — Plan of Care (Signed)
Patient AOX4, VSS throughout shift.  All meds given on time as ordered.  Foley care provided.  BM o/n.  Compression stockings changed.  Pt c/o nausea but refused PRN compazine requesting zofran.  Provider notified.  POC maintained, will continue to monitor.  Problem: Education: Goal: Ability to describe self-care measures that may prevent or decrease complications (Diabetes Survival Skills Education) will improve Outcome: Progressing Goal: Individualized Educational Video(s) Outcome: Progressing   Problem: Coping: Goal: Ability to adjust to condition or change in health will improve Outcome: Progressing   Problem: Fluid Volume: Goal: Ability to maintain a balanced intake and output will improve Outcome: Progressing   Problem: Health Behavior/Discharge Planning: Goal: Ability to identify and utilize available resources and services will improve Outcome: Progressing Goal: Ability to manage health-related needs will improve Outcome: Progressing   Problem: Metabolic: Goal: Ability to maintain appropriate glucose levels will improve Outcome: Progressing   Problem: Nutritional: Goal: Maintenance of adequate nutrition will improve Outcome: Progressing Goal: Progress toward achieving an optimal weight will improve Outcome: Progressing   Problem: Skin Integrity: Goal: Risk for impaired skin integrity will decrease Outcome: Progressing   Problem: Tissue Perfusion: Goal: Adequacy of tissue perfusion will improve Outcome: Progressing   Problem: Education: Goal: Knowledge of General Education information will improve Description: Including pain rating scale, medication(s)/side effects and non-pharmacologic comfort measures Outcome: Progressing   Problem: Health Behavior/Discharge Planning: Goal: Ability to manage health-related needs will improve Outcome: Progressing   Problem: Clinical Measurements: Goal: Ability to maintain clinical measurements within normal limits will  improve Outcome: Progressing Goal: Will remain free from infection Outcome: Progressing Goal: Diagnostic test results will improve Outcome: Progressing Goal: Respiratory complications will improve Outcome: Progressing Goal: Cardiovascular complication will be avoided Outcome: Progressing   Problem: Activity: Goal: Risk for activity intolerance will decrease Outcome: Progressing   Problem: Nutrition: Goal: Adequate nutrition will be maintained Outcome: Progressing   Problem: Coping: Goal: Level of anxiety will decrease Outcome: Progressing   Problem: Elimination: Goal: Will not experience complications related to bowel motility Outcome: Progressing Goal: Will not experience complications related to urinary retention Outcome: Progressing   Problem: Pain Managment: Goal: General experience of comfort will improve Outcome: Progressing   Problem: Safety: Goal: Ability to remain free from injury will improve Outcome: Progressing   Problem: Skin Integrity: Goal: Risk for impaired skin integrity will decrease Outcome: Progressing

## 2023-04-14 NOTE — Progress Notes (Addendum)
Essentially stable. I don't think she is very volume overloaded but it is so hard to tell. I think she should have 1-2 days off diuretics and reassess. I will get her f/u in our office in 1-2 weeks.  I have personally seen and examined this patient and agree with the assessment/plan as outlined below by Gerilyn Pilgrim Rothfuss. I performed the exam and obtained the details provided personally. I made changes to the note as appropriate. Jeremy Johann 04/14/2023 12:26 PM   Nephrology Follow-Up Consult note   Assessment/Recommendations: [name] is a/an 47 y.o. female with a past medical history Morbid obesity, OHS, uncontrolled T2DM, HFpEF, and cocaine use who present w/ Azotemia    Azotemia (stable): Not convinced that this is a true AKI, baseline Cr is around 1.3-1.7 though labile- seems to be around baseline. If this proves to be an AKI, it would likely be secondary to CHF exacerbation. UOP is fine, likely not dehydrated. No clinical signs of uremia, lacking signs including asterixis and confusion. Nausea improved. -Chart reviewed: (Cocaine usage, no contrast noted).   -Continue to monitor daily Cr, Dose meds for GFR -Monitor Daily I/Os, Daily weight  -Maintain MAP>65 for optimal renal perfusion.  -Avoid nephrotoxic medications including NSAIDs -Use synthetic opioids (Fentanyl/Dilaudid) if needed - Currently no indication for HD, but pt states that she is willing if it comes to it in the future - Discussed risk of developing further chronic injury to kidney with each insult to kidney - Discontinue diuretics, seems to be urinating fine - Cr 1.82>1.85>1.95 - Possible slow trend upwards, will follow outpt   Cocaine Use: Discussed with pt the risk of long term renal damage in the sustained use of cocaine. This may play into pt's shortness of breath over the last six months.   Uncontrolled Diabetes Mellitus Type 2 with Hyperglycemia - Counseled pt on strict glucose control outpt - Continued  labile BG, suspect due to dietary noncompliance   Hypokalemia:  - Recommend [place]-Con outpatient   Volume Status: Difficult to ascertain fluid status on exam due to body habitus and presence of compression stockings. Based on our examination and review of available imaging, our recommendation is as above.  Recommendations conveyed to primary service.   Pt is to be discharged today with close follow-up with [place] to be scheduled for next week to monitor labs  Teryl Lucy Rothfuss PA Student [facility] 04/14/2023 10:20 AM  ___________________________________________________________  CC: Azotemia   History of Present Illness: [name] is a/an 47 y.o. female with a past medical history of Morbid obesity, OHS, uncontrolled T2DM, HFpEF, and cocaine use who presents w/ Azotemia. Pertinently pt has had several hospitalizations over the past several months with likely causative or contributory factors of CHF and cocaine use. Pt presented to [facility] with dysuria and cloudy urine. Pt had labs drawn which came back for an unexpected BUN of 139. Pt was advised to head to the ED, which she did, and nephro was consulted.    Pt visited in hospital room, pt is very pleasant today. Pt states that nausea has gotten better, nurse was about to give more compazine during the examination. Pt states that she is leaving today and to follow up outpatient with Dr. Abel Presto who is her nephrologist outpatient. 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 Pt denies CP and states that SOB is at baseline on [place] 4L. Pt has no other questions at this time.  Medications:  Current Facility-Administered Medications  Medication Dose Route Frequency Provider Last Rate Last Admin   acetaminophen (TYLENOL) tablet 325-650 mg  325-650 mg Oral Q4H PRN Tiffany Kocher, DO       albuterol (PROVENTIL) (2.5 MG/3ML) 0.083% nebulizer solution 2.5 mg  2.5 mg Nebulization  Q6H PRN Hammons, Gerhard Munch, RPH       aspirin EC tablet 81 mg  81 mg Oral Daily Tiffany Kocher, DO   81 mg at 04/14/23 0950   brimonidine (ALPHAGAN) 0.2 % ophthalmic solution 1 drop  1 drop Left Eye TID Lincoln Brigham, MD   1 drop at 04/14/23 0950   brinzolamide (AZOPT) 1 % ophthalmic suspension 1 drop  1 drop Left Eye TID Tiffany Kocher, DO   1 drop at 04/14/23 1610   Chlorhexidine Gluconate Cloth 2 % PADS 6 each  6 each Topical Daily Westley Chandler, MD       diclofenac Sodium (VOLTAREN) 1 % topical gel 2 g  2 g Topical QID Tiffany Kocher, DO   2 g at 04/12/23 2131   DULoxetine (CYMBALTA) DR capsule 120 mg  120 mg Oral Daily Espinoza, Alejandra, DO   120 mg at 04/14/23 0950   enoxaparin (LOVENOX) injection 80 mg  80 mg Subcutaneous Q24H Hammons, Kimberly B, RPH   80 mg at 04/13/23 1650   gabapentin (NEURONTIN) capsule 600 mg  600 mg Oral TID Tiffany Kocher, DO   600 mg at 04/14/23 0950   insulin aspart (novoLOG) injection 0-20 Units  0-20 Units Subcutaneous TID WC Tiffany Kocher, DO   20 Units at 04/14/23 0951   insulin aspart (novoLOG) injection 0-5 Units  0-5 Units Subcutaneous QHS Tiffany Kocher, DO   5 Units at 04/13/23 2257   insulin aspart (novoLOG) injection 20 Units  20 Units Subcutaneous TID WC Dameron, Nolberto Hanlon, DO   20 Units at 04/14/23 0951   insulin glargine-yfgn (SEMGLEE) injection 60 Units  60 Units Subcutaneous BID Alfredo Martinez, MD   60 Units at 04/14/23 0950   latanoprost (XALATAN) 0.005 % ophthalmic solution 1 drop  1 drop Both Eyes QHS Tiffany Kocher, DO   1 drop at 04/13/23 2245   loratadine (CLARITIN) tablet 10 mg  10 mg Oral Daily Tiffany Kocher, DO   10 mg at 04/14/23 0949   mometasone-formoterol (DULERA) 200-5 MCG/ACT inhaler 2 puff  2 puff Inhalation BID Tiffany Kocher, DO   2 puff at 04/13/23 1940   oxyCODONE (Oxy IR/ROXICODONE) immediate release tablet 5 mg  5 mg Oral Q4H PRN Tiffany Kocher, DO   5 mg at 04/13/23 2301   pantoprazole (PROTONIX) EC tablet 80 mg   80 mg Oral Daily Tiffany Kocher, DO   80 mg at 04/14/23 0949   potassium chloride (KLOR-CON) packet 40 mEq  40 mEq Oral Once Darnell Level, MD       prochlorperazine (COMPAZINE) tablet 5 mg  5 mg Oral Q6H PRN Lincoln Brigham, MD   5 mg at 04/13/23 2243   rosuvastatin (CRESTOR) tablet 10 mg  10 mg Oral Daily Espinoza, Alejandra, DO   10 mg at 04/14/23 0950   traZODone (DESYREL) tablet 100 mg  100 mg Oral QHS PRN Tiffany Kocher, DO   100 mg at 04/13/23 2301      Review of Systems: 10 systems reviewed and negative except per interval history/subjective  Physical Exam: Vitals:   04/13/23 2052 04/14/23 0814  BP: 124/82 106/72  Pulse: 94 (!) 101  Resp: 14 18  Temp: (!)  97.5 F (36.4 C) (!) 97.5 F (36.4 C)  SpO2: 99% 91%   No intake/output data recorded.  Intake/Output Summary (Last 24 hours) at 04/14/2023 1020 Last data filed at 04/13/2023 1854 Gross per 24 hour  Intake 600 ml  Output 800 ml  Net -200 ml   Constitutional: well-appearing, no acute distress ENMT: ears and nose without scars or lesions, MMM CV: normal rate, possible RLE pretibial edema (difficult to appreciate due to compression stockings). Respiratory: clear to auscultation, normal work of breathing, on 4L  Gastrointestinal: soft, non-tender, no palpable masses or hernias Skin: no visible lesions or rashes Psych: alert, judgement/insight appropriate, appropriate mood and affect MSK: L BKA  Test Results I personally reviewed new and old clinical labs and radiology tests Lab Results  Component Value Date   NA 131 (L) 04/14/2023   K 3.4 (L) 04/14/2023   CL 85 (L) 04/14/2023   CO2 33 (H) 04/14/2023   BUN 136 (H) 04/14/2023   CREATININE 1.95 (H) 04/14/2023   GFR 66.17 11/14/2020   CALCIUM 9.4 04/14/2023   ALBUMIN 3.2 (L) 04/13/2023   PHOS 4.2 07/13/2022    CBC Recent Labs  Lab 04/12/23 1231 04/12/23 1522 04/13/23 0347 04/14/23 0810  WBC 11.5*  --  9.6 9.0  HGB 12.7 15.3* 12.6 12.4  HCT 38.9 45.0  38.9 37.5  MCV 86.8  --  85.7 84.3  PLT 235  --  237 250

## 2023-04-14 NOTE — Progress Notes (Signed)
Heart Failure Nurse Navigator Progress Note  PCP: Latrelle Dodrill, MD PCP-Cardiologist: None Admission Diagnosis: Hyperglycemia, Uremia.  Admitted from: [facility]) via EMS  Presentation:   [name] presented, her doctor called and stated pt's BUN was elevated and advised to come to ED. Patient was short of breath, called EMS,, CBG of 569, was placed on 4 L oxygen and brought to ED. She reported some dysuria, and swelling to her abdomen and right leg. 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 BMI 62.59, positive for cocaine on UDS. CBG 569. BUN 139, A1c 13.6,   Patient has had multiple admissions for HF, in 11/30/22 patient was orginally interviewed for Hf TOC,at thsi time she declined visit and reported to follow up with her PCP on 12/16/2022. Patient was educated on Heart Failure at that time.   Patient with new admission on 04/12/2023, was educated on the sign and symptoms of heart failure, daily weights, when to call her doctor or go to the ED, a scale was brought to bedside for home use. Education completed on Diet/ fluid restrictions ( patient reported that she and her 45 year old daughter live in a hotel currently ( [employer] [address] [place]) they have a fridge , microwave and air fryer that are used for cooking. Reported tht she does drink a lot of water with her Kool-aide and tea. Continued education on taking all her medications as prescribed, will use TOC pharmacy at discharge, reminder about attending all medical appointments, patient reported that she uses her Medicaid and [employer] for all her transportation needs. Reached out to CSW/ CSM for possible assistance with [facility] rent for the week. CSW was going to bedside to speak in detail about this request. Patient verbalized her understanding and a HF TOC appointment was scheduled for 05/04/2023 @ 11 am.     ECHO/ LVEF: 55-60%  Clinical Course:  Past Medical History:  Diagnosis Date   Abscess of groin, left    Acute on  chronic diastolic (congestive) heart failure ([name]) 02/05/2022   Acute on chronic respiratory failure with hypoxia ([name]) 04/11/2022   Acute osteomyelitis of ankle or foot, left ([name]) 01/31/2018   Acute respiratory failure with hypoxia and hypercarbia ([name]) 06/15/2020   Acute tubular necrosis ([name]) 07/12/2022   Alveolar hypoventilation    Amputation stump infection ([name]) 04/09/2018   Anemia    not on iron pill   Asthma    Bipolar 2 disorder ([name])    Candidiasis of vagina 05/15/2022   Carpal tunnel syndrome on right    recurrent   Cellulitis 08/2010-08/2011   Chest pain with low risk for cardiac etiology 05/11/2021   Chronic pain    COPD (chronic obstructive pulmonary disease) ([name])    Symbicort daily and Proventil as needed   Costochondritis    De Quervain's tenosynovitis, bilateral 11/01/2015   Depression    Diabetes mellitus type II, uncontrolled 2000   Type 2, Uncontrolled.Takes Lantus daily.Fasting blood sugar runs 150   Diabetic ulcer of right foot ([name]) 12/19/2021   Drug-seeking behavior    Elevated troponin    Exposure to mold 04/21/2020   Family history of metabolic acidosis with increased anion gap 04/12/2023   GERD (gastroesophageal reflux disease)    takes Pantoprazole and Zantac daily   HLD (hyperlipidemia)    takes Atorvastatin daily   Hypertension    takes Lisinopril and Coreg daily   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[name]) 04/11/2019   Left shoulder pain 06/23/2019   Ludwig's  angina 07/05/2022   Morbid obesity ([name])    Morbid obesity with BMI of 60.0-69.9, adult ([name]) 04/15/2021   Nocturia    Nonobstructive atherosclerosis of coronary artery 11/26/2020   11/26/20 Coronary angiogram: 1.  Mild diffuse proximal LAD stenosis with no evidence of obstructive disease (20 to 30% diffuse stenosis);  2.  Left dominant circumflex with widely patent obtuse marginal branches and left PDA branch, no significant stenoses present  3.  Nondominant RCA with mild diffuse nonobstructive stenosis     OSA on CPAP    Peripheral neuropathy    takes Gabapentin daily   Pneumonia    "walking" several yrs ago and as a baby (12/05/2018)   Pneumonia due to COVID-19 virus 04/14/2021   Primary osteoarthritis of first carpometacarpal joint of left hand 07/30/2016   Rectal fissure    Restless leg    Right carpal tunnel syndrome 09/01/2011   SVT (supraventricular tachycardia)    Syncope 02/25/2016   Thrombocytosis 04/11/2022   Urinary frequency    Urinary incontinence 10/23/2020   UTI (urinary tract infection) 04/15/2021   Varicose veins    Right medial thigh and Left leg      Social History   Socioeconomic History   Marital status: Divorced    Spouse name: Not on file   Number of children: 1   Years of education: Not on file   Highest education level: 10th grade  Occupational History    Comment: SSI  Tobacco Use   Smoking status: Former    Packs/day: 0.25    Years: 15.00    Additional pack years: 0.00    Total pack years: 3.75    Types: Cigarettes    Quit date: 12/06/2005    Years since quitting: 17.3   Smokeless tobacco: Never   Tobacco comments:    smokes for a couple of months  Vaping Use   Vaping Use: Never used  Substance and Sexual Activity   Alcohol use: Not Currently   Drug use: Yes    Types: "Crack" cocaine    Comment: do when stressed out and in pain.   Sexual activity: Yes    Partners: Male  Other Topics Concern   Not on file  Social History Narrative   Lives in [place] with her fiance and 45 yr old dtr.   Social Determinants of Health   Financial Resource Strain: Low Risk  (04/14/2023)   Overall Financial Resource Strain (CARDIA)    Difficulty of Paying Living Expenses: Not hard at all  Food Insecurity: No Food Insecurity (03/22/2023)   Hunger Vital Sign    Worried About Running Out of Food in the Last Year: Never true    Ran Out of Food in the Last Year: Never true  Transportation Needs: No Transportation Needs (04/14/2023)   PRAPARE - Therapist, art (Medical): No    Lack of Transportation (Non-Medical): No  Physical Activity: Not on file  Stress: Stress Concern Present (02/19/2021)   [employer] of Occupational Health - Occupational Stress Questionnaire    Feeling of Stress : Very much  Social Connections: Not on file   Education Assessment and Provision:  Detailed education and instructions provided on heart failure disease management including the following:  Signs and symptoms of Heart Failure When to call the physician Importance of daily weights Low sodium diet Fluid restriction Medication management Anticipated future follow-up appointments  Patient education given on each of the above topics.  Patient acknowledges understanding via  teach back method and acceptance of all instructions.  Education Materials:  "Living Better With Heart Failure" Booklet, HF zone tool, & Daily Weight Tracker Tool.  Patient has scale at home: No, brought a scale to bedside Patient has pill box at home: yes    High Risk Criteria for Readmission and/or Poor Patient Outcomes: Heart failure hospital admissions (last 6 months):  2 No Show rate: 31 % Difficult social situation: yes, lives in hotel with 51 yr old daughter, pays bi-weekly, gets SSI check,  Demonstrates medication adherence: yes Primary Language: English Literacy level: Reading, writing, and comprehension  Barriers of Care:   Financials ( lives in hotel) Diet/ fluid restrictions, ( cooks in hotel has a fridge, microwave, and air fryer ) Daily weight ( gave a scale )  Appointment compliance    Considerations/Referrals:   Referral made to Heart Failure Pharmacist Stewardship: Yes Referral made to Heart Failure CSW/NCM TOC: Yes, multiple SDOH needs, hotel (rent) Referral made to Heart & Vascular TOC clinic: Yes, 05/04/2023 @ 11 am. Will use Medicaid or [employer] transportation services.   Items for Follow-up on DC/TOC: Financials ( CSW) Hotel  rent Diet/ fluid restrictions ( lives/cooks in hotel, has fridge, microwave and air fryer)  Daily weights ( gave a scale) patient is a LBKA. And missing half right foot, but able to stand with walker.  Appointment compliance   Rhae Hammock, BSN, RN Heart Failure Print production planner Chat Only

## 2023-04-14 NOTE — TOC Transition Note (Signed)
Transition of Care [facility]) - CM/SW Discharge Note   Patient Details  Name: [name] MRN: [id number] Date of Birth: [birth year]  [facility] CM/SW Contact:  Janae Bridgeman, RN Phone Number: 04/14/2023, 3:46 PM   Clinical Narrative:    CM met with the patient at the bedside prior to her discharge to home.  The patient will need PTAR transport to home - patient has Left lower leg BKA and is oxygen dependent on 3L/min [place].  PTAR is set up and bedside nursing will provide discharge instructions at the bedside.  The patient's daughter, [name] is at her residence at this time.  The patient has a CAP CNA at the home daily as well through CAP services.  The patient is being provided with 2 pairs of thigh high Ted hose for home prior to discharge.  The patient will take her CPAP mask home when she is discharged ad PTAR to provide transportation and oxygen.   Final next level of care: Home/Self Care (Patient has aide through [facility]) Barriers to Discharge: Continued Medical Work up   Patient Goals and CMS Choice CMS Medicare.gov Compare Post Acute Care list provided to:: Patient Choice offered to / list presented to : Patient  Discharge Placement                         Discharge Plan and Services Additional resources added to the After Visit Summary for     Discharge Planning Services: CM Consult Post Acute Care Choice: Durable Medical Equipment          DME Arranged: 3-N-1, Nebulizer machine DME Agency: [employer] Date DME Agency Contacted: 04/13/23 Time DME Agency Contacted: 1146 Representative spoke with at DME Agency: Barbara Cower, CM with [employer]            Social Determinants of Health (SDOH) Interventions SDOH Screenings   Food Insecurity: No Food Insecurity (04/14/2023)  Housing: Medium Risk (04/14/2023)  Transportation Needs: No Transportation Needs (04/14/2023)  Utilities: Not At Risk (04/14/2023)  Alcohol Screen: Low Risk  (04/14/2023)  Depression  (PHQ2-9): Medium Risk (03/07/2023)  Financial Resource Strain: Medium Risk (04/14/2023)  Stress: Stress Concern Present (02/19/2021)  Tobacco Use: Medium Risk (04/14/2023)     Readmission Risk Interventions    03/22/2023   10:47 AM 05/24/2022   11:33 AM  Readmission Risk Prevention Plan  Transportation Screening Complete Complete  Medication Review Oceanographer) Complete Complete  PCP or Specialist appointment within 3-5 days of discharge Complete Complete  HRI or Home Care Consult Complete Complete  SW Recovery Care/Counseling Consult Complete Complete  Palliative Care Screening Not Applicable Not Applicable  Skilled Nursing Facility Not Applicable Patient Refused

## 2023-04-15 ENCOUNTER — Telehealth (HOSPITAL_COMMUNITY): Payer: Self-pay | Admitting: Licensed Clinical Social Worker

## 2023-04-15 ENCOUNTER — Telehealth: Payer: Self-pay

## 2023-04-15 NOTE — Telephone Encounter (Signed)
H&V Care Navigation CSW Progress Note  Clinical Social Worker assisted in making partial payment for hotel stay.  Pt reports she can pay the remainder of this month.  Working on finding affordable housing options.  Had section 8 voucher but thinks she lost it- encouraged her to reach out to section 8 to confirm if it is still active.  Also states she knows a friend who stays in affordable housing and states there are openings there- hopeful to get an apartment soon.  CSW informed pt that we might can assist with start up costs for housing as well if she continues to be an active patient with Heart and Vascular.   SDOH Screenings   Food Insecurity: No Food Insecurity (04/14/2023)  Housing: Medium Risk (04/14/2023)  Transportation Needs: No Transportation Needs (04/14/2023)  Utilities: Not At Risk (04/14/2023)  Alcohol Screen: Low Risk  (04/14/2023)  Depression (PHQ2-9): Medium Risk (03/07/2023)  Financial Resource Strain: Medium Risk (04/14/2023)  Stress: Stress Concern Present (02/19/2021)  Tobacco Use: Medium Risk (04/14/2023)   Burna Sis, LCSW Clinical Social Worker Advanced [facility] Failure Clinic Desk#: [phone] Cell#: [phone]

## 2023-04-15 NOTE — Transitions of Care (Post Inpatient/ED Visit) (Signed)
04/15/2023  Name: [name] MRN: [id number] DOB: [birth year]  Today's TOC FU Call Status: Today's TOC FU Call Status:: Successful TOC FU Call Competed TOC FU Call Complete Date: 04/15/23  Transition Care Management Follow-up Telephone Call Date of Discharge: 04/14/23 Discharge Facility: [name]) Type of Discharge: Inpatient Admission Primary Inpatient Discharge Diagnosis:: hypoglycemia How have you been since you were released from the hospital?: Better Any questions or concerns?: No  Items Reviewed: Did you receive and understand the discharge instructions provided?: Yes Medications obtained,verified, and reconciled?: Yes (Medications Reviewed) Any new allergies since your discharge?: No Dietary orders reviewed?: Yes Do you have support at home?: Yes People in Home: child(ren), adult  Medications Reviewed Today: Medications Reviewed Today     Reviewed by Karena Addison, LPN (Licensed Practical Nurse) on 04/15/23 at [phone]  Med List Status: <None>   Medication Order Taking? Sig Documenting Provider Last Dose Status Informant  acetaminophen (TYLENOL) 325 MG tablet [id number] Yes Take 1-2 tablets (325-650 mg total) by mouth every 4 (four) hours as needed for mild pain. Setzer, Lynnell Jude, PA-C Taking Active Self, Pharmacy Records  albuterol (VENTOLIN HFA) 108 (90 Base) MCG/ACT inhaler [id number] Yes Inhale 2 puffs into the lungs every 6 (six) hours as needed for wheezing or shortness of breath. Latrelle Dodrill, MD Taking Active Self, Pharmacy Records  amLODipine [facility]) 10 MG tablet [id number] Yes Take 1 tablet (10 mg total) by mouth daily. Latrelle Dodrill, MD Taking Active Self, Pharmacy Records  aspirin EC 81 MG tablet [id number] Yes Take 1 tablet (81 mg total) by mouth daily. Swallow whole. Tonny Bollman, MD Taking Active Self, Pharmacy Records  Brinzolamide-Brimonidine [facility]) 1-0.2 % SUSP [id number] Yes Place 1 drop into the left eye in the morning, at noon,  and at bedtime. [provider] Taking Active Self, Pharmacy Records           Med Note [facility], [place] Feb 20, 2023  1:58 AM)    budesonide-formoterol [facility]) 160-4.5 MCG/ACT inhaler [id number] Yes Inhale 2 puffs into the lungs 2 (two) times daily. Latrelle Dodrill, MD Taking Active Self, Pharmacy Records  cetirizine (ZYRTEC) 10 MG tablet [id number] Yes Take 1 tablet (10 mg total) by mouth daily as needed for allergies. Latrelle Dodrill, MD Taking Active Self, Pharmacy Records  Continuous Blood Gluc Sensor (DEXCOM G6 SENSOR) [place] [id number] Yes Inject 1 applicator into the skin as directed. Change sensor every 10 days. Latrelle Dodrill, MD Taking Active Self, Pharmacy Records  Continuous Blood Gluc Transmit (DEXCOM G6 TRANSMITTER) MISC [id number] Yes Inject 1 Device into the skin as directed. Reuse 8 times with sensor changes. Latrelle Dodrill, MD Taking Active Self, Pharmacy Records  diclofenac Sodium (VOLTAREN) 1 % GEL [id number] Yes Apply 2 g topically 4 (four) times daily. Fayette Pho, MD Taking Active Self, Pharmacy Records  DULoxetine (CYMBALTA) 60 MG capsule [id number] Yes TAKE 2 CAPSULES BY MOUTH DAILY Latrelle Dodrill, MD Taking Active Self, Pharmacy Records  gabapentin (NEURONTIN) 300 MG capsule [id number] Yes Take 2 capsules (600 mg total) by mouth 3 (three) times daily. Alfredo Martinez, MD Taking Active   GNP ULTICARE PEN NEEDLES 32G X 4 MM MISC [id number] Yes USE TO inject insulin 2 TIMES DAILY Latrelle Dodrill, MD Taking Active Self, Pharmacy Records  insulin aspart (NOVOLOG FLEXPEN) 100 UNIT/ML FlexPen [id number] Yes Inject 20 Units into the skin 3 (three) times daily with meals.  Patient taking differently: Inject 30 Units into the skin 3 (  three) times daily with meals.   Maury Dus, MD Taking Active Self, Pharmacy Records           Med Note (CRUTHIS, Seth Bake Mar 20, 2023  9:21 AM)    insulin degludec (TRESIBA FLEXTOUCH) 200 UNIT/ML  FlexTouch Pen [id number] Yes Inject 70 Units into the skin 2 (two) times daily. Alfredo Martinez, MD Taking Active   Insulin Disposable Pump (OMNIPOD 5 G6 POD, GEN 5,) MISC [id number] Yes Inject into the skin daily. [provider] Taking Active Self, Pharmacy Records  ipratropium-albuterol (DUONEB) 0.5-2.5 (3) MG/3ML SOLN [id number] Yes Take 3 mLs by nebulization every 6 (six) hours as needed.  Patient taking differently: Take 3 mLs by nebulization every 6 (six) hours as needed (for shortness of breath and wheezing).   Charlott Holler, MD Taking Active Self, Pharmacy Records  latanoprost (XALATAN) 0.005 % ophthalmic solution [id number] Yes Place 1 drop into both eyes daily. [provider] Taking Active Self, Pharmacy Records  lidocaine (LIDODERM) 5 % [id number] Yes Place 1 patch onto the skin daily. Remove & Discard patch within 12 hours or as directed by MD Fayette Pho, MD Taking Active Self, Pharmacy Records  metolazone (ZAROXOLYN) 2.5 MG tablet [id number] Yes Take 1 tablet (2.5 mg total) by mouth daily. Alfredo Martinez, MD Taking Active   Multiple Vitamins-Minerals (ONE-A-DAY WOMENS) tablet [id number] Yes Take 1 tablet by mouth daily. [provider] Taking Active Self, Pharmacy Records  nitroGLYCERIN (NITROSTAT) 0.4 MG SL tablet [id number] Yes Place 1 tablet (0.4 mg total) under the tongue every 5 (five) minutes as needed for chest pain. Latrelle Dodrill, MD Taking Active Self, Pharmacy Records  nystatin (MYCOSTATIN/NYSTOP) powder [id number] Yes Apply 1 Application topically 3 (three) times daily. Latrelle Dodrill, MD Taking Active Self, Pharmacy Records  omeprazole [facility]) 40 MG capsule [id number] Yes Take 1 capsule (40 mg total) by mouth in the morning and at bedtime. Charlott Holler, MD Taking Active Self, Pharmacy Records  RHOPRESSA 0.02 % SOLN [id number] Yes Place 1 drop into the left eye daily. [provider] Taking Active Self, Pharmacy Records   rOPINIRole (REQUIP) 1 MG tablet [id number] Yes Take 1 tablet (1 mg total) by mouth at bedtime. Latrelle Dodrill, MD Taking Active Self, Pharmacy Records  rosuvastatin (CRESTOR) 10 MG tablet [id number] Yes Take 1 tablet (10 mg total) by mouth daily. Alfredo Martinez, MD Taking Active   torsemide [facility]) 100 MG tablet [id number] Yes Take 1 tablet (100 mg total) by mouth 2 (two) times daily. Latrelle Dodrill, MD Taking Active Self, Pharmacy Records  traZODone (DESYREL) 100 MG tablet [id number] Yes TAKE 1 TABLET BY MOUTH AT BEDTIME AS NEEDED FOR SLEEP Pollie Meyer Estevan Ryder, MD Taking Active Self, Pharmacy Records  Vitamin D, Cholecalciferol, 25 MCG (1000 UT) CAPS [id number] Yes Take 1,000 Units by mouth daily. Latrelle Dodrill, MD Taking Active Self, Pharmacy Records            Home Care and Equipment/Supplies: Were Home Health Services Ordered?: NA Any new equipment or medical supplies ordered?: NA  Functional Questionnaire: Do you need assistance with bathing/showering or dressing?: No Do you need assistance with meal preparation?: No Do you need assistance with eating?: No Do you have difficulty maintaining continence: No Do you need assistance with getting out of bed/getting out of a chair/moving?: No Do you have difficulty managing or taking your medications?: No  Follow up appointments reviewed: PCP Follow-up appointment confirmed?: No (declined, will call back later) MD  Provider Line [phone] Given: No Specialist Hospital Follow-up appointment confirmed?: No Reason Specialist Follow-Up Not Confirmed: Patient has Specialist Provider Number and will Call for Appointment Do you need transportation to your follow-up appointment?: No Do you understand care options if your condition(s) worsen?: Yes-patient verbalized understanding    SIGNATURE Karena Addison, LPN [facility] Nurse Health Advisor Direct Dial [phone]

## 2023-04-18 ENCOUNTER — Telehealth: Payer: Self-pay

## 2023-04-18 DIAGNOSIS — E1142 Type 2 diabetes mellitus with diabetic polyneuropathy: Secondary | ICD-10-CM | POA: Diagnosis not present

## 2023-04-18 DIAGNOSIS — Z794 Long term (current) use of insulin: Secondary | ICD-10-CM | POA: Diagnosis not present

## 2023-04-18 DIAGNOSIS — R110 Nausea: Secondary | ICD-10-CM | POA: Diagnosis not present

## 2023-04-18 DIAGNOSIS — E1165 Type 2 diabetes mellitus with hyperglycemia: Secondary | ICD-10-CM | POA: Diagnosis not present

## 2023-04-18 NOTE — Telephone Encounter (Signed)
PA request received via CMM for Omeprazole 40MG  dr capsules  PA submitted to [facility] and has been APPROVED from 04/18/2023-04/17/2024  Key: [id number]

## 2023-04-19 DIAGNOSIS — Z89512 Acquired absence of left leg below knee: Secondary | ICD-10-CM | POA: Diagnosis not present

## 2023-04-19 DIAGNOSIS — E119 Type 2 diabetes mellitus without complications: Secondary | ICD-10-CM | POA: Diagnosis not present

## 2023-04-19 DIAGNOSIS — R32 Unspecified urinary incontinence: Secondary | ICD-10-CM | POA: Diagnosis not present

## 2023-04-20 DIAGNOSIS — Z961 Presence of intraocular lens: Secondary | ICD-10-CM | POA: Diagnosis not present

## 2023-04-20 DIAGNOSIS — H15103 Unspecified episcleritis, bilateral: Secondary | ICD-10-CM | POA: Diagnosis not present

## 2023-04-20 DIAGNOSIS — H4053X4 Glaucoma secondary to other eye disorders, bilateral, indeterminate stage: Secondary | ICD-10-CM | POA: Diagnosis not present

## 2023-04-20 DIAGNOSIS — E113513 Type 2 diabetes mellitus with proliferative diabetic retinopathy with macular edema, bilateral: Secondary | ICD-10-CM | POA: Diagnosis not present

## 2023-04-21 ENCOUNTER — Ambulatory Visit: Payer: Medicaid Other | Admitting: Occupational Therapy

## 2023-04-21 ENCOUNTER — Other Ambulatory Visit: Payer: Self-pay

## 2023-04-21 ENCOUNTER — Encounter: Payer: Self-pay | Admitting: Student

## 2023-04-21 ENCOUNTER — Ambulatory Visit (INDEPENDENT_AMBULATORY_CARE_PROVIDER_SITE_OTHER): Payer: 59 | Admitting: Student

## 2023-04-21 ENCOUNTER — Ambulatory Visit: Payer: Medicaid Other | Admitting: Physical Therapy

## 2023-04-21 VITALS — BP 106/70 | HR 93 | Ht 62.0 in | Wt 344.0 lb

## 2023-04-21 DIAGNOSIS — I5032 Chronic diastolic (congestive) heart failure: Secondary | ICD-10-CM

## 2023-04-21 DIAGNOSIS — R110 Nausea: Secondary | ICD-10-CM | POA: Diagnosis not present

## 2023-04-21 DIAGNOSIS — E1140 Type 2 diabetes mellitus with diabetic neuropathy, unspecified: Secondary | ICD-10-CM | POA: Diagnosis not present

## 2023-04-21 DIAGNOSIS — N19 Unspecified kidney failure: Secondary | ICD-10-CM | POA: Diagnosis not present

## 2023-04-21 DIAGNOSIS — Z794 Long term (current) use of insulin: Secondary | ICD-10-CM | POA: Diagnosis not present

## 2023-04-21 MED ORDER — ONDANSETRON 4 MG PO TBDP
4.0000 mg | ORAL_TABLET | Freq: Three times a day (TID) | ORAL | 0 refills | Status: DC | PRN
Start: 2023-04-21 — End: 2023-06-11

## 2023-04-21 MED ORDER — METOLAZONE 5 MG PO TABS
5.0000 mg | ORAL_TABLET | Freq: Every day | ORAL | 3 refills | Status: DC
Start: 2023-04-21 — End: 2023-04-29

## 2023-04-21 NOTE — Patient Instructions (Addendum)
Gwenith Spitz to see you. Sounds like you need 5mg  of metolazone so I have updated your prescription.  We are sending in some Zofran---eager to hear what comes of this GI workup.  We are going to recheck your labs today to check in on your renal function.  Be sure to keep your appointment with orthopedics so we can get to the bottom of what is going on in your wrist.  J Dorothyann Gibbs, MD

## 2023-04-21 NOTE — Assessment & Plan Note (Addendum)
Currently on Novalog 45 TIB, Triseba 70 BID. Following with endo; planning to start Ozempic once cleared by GI.  - Continue to follow with endo - Continue regular monitoring

## 2023-04-21 NOTE — Progress Notes (Signed)
SUBJECTIVE:   CHIEF COMPLAINT / HPI:   [name] is a 47 y.o. female who presents today for follow-up of recent hospitalization.  Hospital Course Recent hospitalization earlier in 04/2023 for uremia and volume overload.  BUN at discharge remained elevated at 136 but patient was asymptomatic.  Patient was lying overload, thought to be exacerbated CHF in the setting of cocaine abuse.  She was diuresed with IV Lasix 80 mg daily.  Euvolemic at time of discharge. Also noted to be grossly hyperglycemic throughout hospitalization with glucoses >600 on admission.  Her insulin regimen was increased to [place] 70 units twice daily and NovoLog 20 units 3 times daily with meals.  T2DM / Nausea Today, she reports she has seen endo after her hospitalization, who adjusted her meds to Novalog 45 TIB, Triseba 70 BID. Has a dexcom, is using and it communicates with her pump. Also planning to start Ozempic; has this already, planning to take once cleared by GI. Referred to them by her endocrinologist for gastric emptying study prior to initiation of Ozempic. She also reports she has nausea every morning, occ vomiting up acid. Zofran helped in past with nausea, but no longer has this prescription. Unclear if this GERD vs gastroparesis given uncontrolled DM2.   R axillary boil Reports boil in R armpit. First showed up a couple weeks ago; will bust and drain and come back. She reports longstanding history of multiple boils in various places on her body. Currently draining spontaneously. No fevers.   R wrist pain She also reports R wrist issue which started end of April. She describes this as a muscle spasm which radiates up forearm and down into her fingers. Wearing wrist-protective glove at night for 1-2 week now. Has had carpal tunnel surgery before. Tried heating pad with some benefit. She has an upcoming appointment with an orthopedist to investigate further on 5/21.  HFpEF Metolazone previously  prescribed at 2.5mg  daily, but she feels she only achieves adequate diuresis with 5mg  daily in addition to her torsemide.   She reports she is still using cocaine occasionally, especially when she gets stressed, but that her daughter is a good deterrent for her.  PERTINENT  PMH / PSH: CKD, T2DM, PVD, cocaine use, heart failure  OBJECTIVE:   BP 106/70   Pulse 93   Ht 5\' 2"  (1.575 m)   Wt (!) 344 lb (156 kg)   SpO2 95%   BMI 62.92 kg/m   Gen: Chronically ill appearing, O2 [place] in place, NAD Axillae: Boil in R axilla, draining spontaneously. No sign of infection. 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 RLE with trace pedal edema   ASSESSMENT/PLAN:   Type 2 diabetes mellitus with diabetic neuropathy, unspecified ([name]) Currently on Novalog 45 TIB, Triseba 70 BID. Following with endo; planning to start Ozempic once cleared by GI.  - Continue to follow with endo - Continue regular monitoring  (HFpEF) heart failure with preserved ejection fraction ([name]) Reports metolazone 5 mg improved urinary outflow and that prescribed 2.5 mg dose did not have much of an effect. Also on torsemide. - Will increase metolazone to 5 mg daily; continue torsemide 100mg  BID  Uremia Grossly elevated during recent hospitalization. No apparent uremic encephalopathy today. Do ? Whether this is playing into her chronic nausea. Uremia thought related to cocaine use based on nephrology assessment while in hospital - Repeat BMP today  -  Follows with nephrology - Zofran 4mg  PRN for nausea - Cocaine abstinence is a must    R axillary boil Not infectious appearing on exam. Encouraged pt to continue monitoring. Can consider intervention if glucose comes under better control. - No interventions continued at this time.  R Wrist Pain Reports pain in R wrist radiating up forearm and down fingers; very tender to light palpation.  Already has upcoming appointment scheduled with ortho. - Encouraged ortho follow-up - Continue to wear wrist guard - Can use heating pads for pain relief  Governor Rooks, Medical Student [facility]    I have evaluated this patient along with Med Student Governor Rooks and reviewed the above note, making necessary revisions.  Dorothyann Gibbs, MD 04/21/2023, 4:35 PM PGY-2, [facility]

## 2023-04-21 NOTE — Progress Notes (Deleted)
error 

## 2023-04-21 NOTE — Assessment & Plan Note (Signed)
Grossly elevated during recent hospitalization. No apparent uremic encephalopathy today. Do ? Whether this is playing into her chronic nausea. Uremia thought related to cocaine use based on nephrology assessment while in hospital - Repeat BMP today  - Follows with nephrology - Zofran 4mg  PRN for nausea - Cocaine abstinence is a must

## 2023-04-21 NOTE — Assessment & Plan Note (Addendum)
Reports metolazone 5 mg improved urinary outflow and that prescribed 2.5 mg dose did not have much of an effect. Also on torsemide. - Will increase metolazone to 5 mg daily; continue torsemide 100mg  BID

## 2023-04-22 LAB — BASIC METABOLIC PANEL
BUN/Creatinine Ratio: 63 — ABNORMAL HIGH (ref 9–23)
BUN: 124 mg/dL (ref 6–24)
CO2: 35 mmol/L — ABNORMAL HIGH (ref 20–29)
Calcium: 9.7 mg/dL (ref 8.7–10.2)
Chloride: 80 mmol/L — ABNORMAL LOW (ref 96–106)
Creatinine, Ser: 1.96 mg/dL — ABNORMAL HIGH (ref 0.57–1.00)
Glucose: 289 mg/dL — ABNORMAL HIGH (ref 70–99)
Potassium: 4.2 mmol/L (ref 3.5–5.2)
Sodium: 135 mmol/L (ref 134–144)
eGFR: 32 mL/min/{1.73_m2} — ABNORMAL LOW (ref >59)

## 2023-04-25 ENCOUNTER — Encounter (HOSPITAL_COMMUNITY): Payer: Self-pay

## 2023-04-25 ENCOUNTER — Inpatient Hospital Stay (HOSPITAL_COMMUNITY)
Admission: EM | Admit: 2023-04-25 | Discharge: 2023-04-29 | DRG: 690 | Disposition: A | Discharge status: Home / Self Care | Payer: 59 | Attending: Internal Medicine | Admitting: Internal Medicine

## 2023-04-25 ENCOUNTER — Emergency Department (HOSPITAL_COMMUNITY): Payer: 59

## 2023-04-25 ENCOUNTER — Other Ambulatory Visit: Payer: Self-pay

## 2023-04-25 DIAGNOSIS — Z881 Allergy status to other antibiotic agents status: Secondary | ICD-10-CM

## 2023-04-25 DIAGNOSIS — I10 Essential (primary) hypertension: Secondary | ICD-10-CM | POA: Diagnosis not present

## 2023-04-25 DIAGNOSIS — N184 Chronic kidney disease, stage 4 (severe): Secondary | ICD-10-CM | POA: Diagnosis present

## 2023-04-25 DIAGNOSIS — I2510 Atherosclerotic heart disease of native coronary artery without angina pectoris: Secondary | ICD-10-CM | POA: Diagnosis present

## 2023-04-25 DIAGNOSIS — Z8249 Family history of ischemic heart disease and other diseases of the circulatory system: Secondary | ICD-10-CM

## 2023-04-25 DIAGNOSIS — I5032 Chronic diastolic (congestive) heart failure: Secondary | ICD-10-CM | POA: Diagnosis present

## 2023-04-25 DIAGNOSIS — N19 Unspecified kidney failure: Secondary | ICD-10-CM

## 2023-04-25 DIAGNOSIS — Z91018 Allergy to other foods: Secondary | ICD-10-CM

## 2023-04-25 DIAGNOSIS — E1169 Type 2 diabetes mellitus with other specified complication: Secondary | ICD-10-CM | POA: Diagnosis present

## 2023-04-25 DIAGNOSIS — N179 Acute kidney failure, unspecified: Secondary | ICD-10-CM | POA: Diagnosis present

## 2023-04-25 DIAGNOSIS — Z7982 Long term (current) use of aspirin: Secondary | ICD-10-CM

## 2023-04-25 DIAGNOSIS — G2581 Restless legs syndrome: Secondary | ICD-10-CM | POA: Diagnosis present

## 2023-04-25 DIAGNOSIS — F1490 Cocaine use, unspecified, uncomplicated: Secondary | ICD-10-CM | POA: Diagnosis present

## 2023-04-25 DIAGNOSIS — Z885 Allergy status to narcotic agent status: Secondary | ICD-10-CM

## 2023-04-25 DIAGNOSIS — Z794 Long term (current) use of insulin: Secondary | ICD-10-CM

## 2023-04-25 DIAGNOSIS — G4733 Obstructive sleep apnea (adult) (pediatric): Secondary | ICD-10-CM | POA: Diagnosis present

## 2023-04-25 DIAGNOSIS — Z8616 Personal history of COVID-19: Secondary | ICD-10-CM

## 2023-04-25 DIAGNOSIS — G251 Drug-induced tremor: Secondary | ICD-10-CM | POA: Diagnosis not present

## 2023-04-25 DIAGNOSIS — Z9981 Dependence on supplemental oxygen: Secondary | ICD-10-CM

## 2023-04-25 DIAGNOSIS — E876 Hypokalemia: Secondary | ICD-10-CM | POA: Diagnosis present

## 2023-04-25 DIAGNOSIS — Z743 Need for continuous supervision: Secondary | ICD-10-CM | POA: Diagnosis not present

## 2023-04-25 DIAGNOSIS — Z79899 Other long term (current) drug therapy: Secondary | ICD-10-CM

## 2023-04-25 DIAGNOSIS — E662 Morbid (severe) obesity with alveolar hypoventilation: Secondary | ICD-10-CM | POA: Diagnosis present

## 2023-04-25 DIAGNOSIS — N390 Urinary tract infection, site not specified: Principal | ICD-10-CM | POA: Diagnosis present

## 2023-04-25 DIAGNOSIS — E1165 Type 2 diabetes mellitus with hyperglycemia: Secondary | ICD-10-CM | POA: Diagnosis present

## 2023-04-25 DIAGNOSIS — Z83438 Family history of other disorder of lipoprotein metabolism and other lipidemia: Secondary | ICD-10-CM

## 2023-04-25 DIAGNOSIS — R0602 Shortness of breath: Secondary | ICD-10-CM | POA: Diagnosis not present

## 2023-04-25 DIAGNOSIS — Z825 Family history of asthma and other chronic lower respiratory diseases: Secondary | ICD-10-CM

## 2023-04-25 DIAGNOSIS — K219 Gastro-esophageal reflux disease without esophagitis: Secondary | ICD-10-CM | POA: Diagnosis present

## 2023-04-25 DIAGNOSIS — R253 Fasciculation: Secondary | ICD-10-CM | POA: Diagnosis not present

## 2023-04-25 DIAGNOSIS — Z91041 Radiographic dye allergy status: Secondary | ICD-10-CM

## 2023-04-25 DIAGNOSIS — Z6841 Body Mass Index (BMI) 40.0 and over, adult: Secondary | ICD-10-CM

## 2023-04-25 DIAGNOSIS — Z833 Family history of diabetes mellitus: Secondary | ICD-10-CM

## 2023-04-25 DIAGNOSIS — Z7951 Long term (current) use of inhaled steroids: Secondary | ICD-10-CM

## 2023-04-25 DIAGNOSIS — E785 Hyperlipidemia, unspecified: Secondary | ICD-10-CM | POA: Diagnosis present

## 2023-04-25 DIAGNOSIS — T502X5A Adverse effect of carbonic-anhydrase inhibitors, benzothiadiazides and other diuretics, initial encounter: Secondary | ICD-10-CM | POA: Diagnosis present

## 2023-04-25 DIAGNOSIS — J9611 Chronic respiratory failure with hypoxia: Secondary | ICD-10-CM | POA: Diagnosis present

## 2023-04-25 DIAGNOSIS — N3000 Acute cystitis without hematuria: Secondary | ICD-10-CM | POA: Diagnosis not present

## 2023-04-25 DIAGNOSIS — F3181 Bipolar II disorder: Secondary | ICD-10-CM | POA: Diagnosis present

## 2023-04-25 DIAGNOSIS — T426X5A Adverse effect of other antiepileptic and sedative-hypnotic drugs, initial encounter: Secondary | ICD-10-CM | POA: Diagnosis not present

## 2023-04-25 DIAGNOSIS — Z89421 Acquired absence of other right toe(s): Secondary | ICD-10-CM

## 2023-04-25 DIAGNOSIS — I130 Hypertensive heart and chronic kidney disease with heart failure and stage 1 through stage 4 chronic kidney disease, or unspecified chronic kidney disease: Secondary | ICD-10-CM | POA: Diagnosis present

## 2023-04-25 DIAGNOSIS — E1142 Type 2 diabetes mellitus with diabetic polyneuropathy: Secondary | ICD-10-CM | POA: Diagnosis present

## 2023-04-25 DIAGNOSIS — Z89512 Acquired absence of left leg below knee: Secondary | ICD-10-CM

## 2023-04-25 DIAGNOSIS — Z87891 Personal history of nicotine dependence: Secondary | ICD-10-CM

## 2023-04-25 DIAGNOSIS — G894 Chronic pain syndrome: Secondary | ICD-10-CM | POA: Diagnosis present

## 2023-04-25 DIAGNOSIS — E1122 Type 2 diabetes mellitus with diabetic chronic kidney disease: Secondary | ICD-10-CM | POA: Diagnosis present

## 2023-04-25 DIAGNOSIS — J4489 Other specified chronic obstructive pulmonary disease: Secondary | ICD-10-CM | POA: Diagnosis present

## 2023-04-25 LAB — CBC WITH DIFFERENTIAL/PLATELET
Abs Immature Granulocytes: 0.07 10*3/uL (ref 0.00–0.07)
Basophils Absolute: 0.1 10*3/uL (ref 0.0–0.1)
Basophils Relative: 1 %
Eosinophils Absolute: 0.2 10*3/uL (ref 0.0–0.5)
Eosinophils Relative: 2 %
HCT: 40.9 % (ref 36.0–46.0)
Hemoglobin: 12.8 g/dL (ref 12.0–15.0)
Immature Granulocytes: 1 %
Lymphocytes Relative: 32 %
Lymphs Abs: 3.3 10*3/uL (ref 0.7–4.0)
MCH: 27.9 pg (ref 26.0–34.0)
MCHC: 31.3 g/dL (ref 30.0–36.0)
MCV: 89.1 fL (ref 80.0–100.0)
Monocytes Absolute: 0.8 10*3/uL (ref 0.1–1.0)
Monocytes Relative: 8 %
Neutro Abs: 5.9 10*3/uL (ref 1.7–7.7)
Neutrophils Relative %: 56 %
Platelets: 359 10*3/uL (ref 150–400)
RBC: 4.59 MIL/uL (ref 3.87–5.11)
RDW: 15.6 % — ABNORMAL HIGH (ref 11.5–15.5)
WBC: 10.3 10*3/uL (ref 4.0–10.5)
nRBC: 0 % (ref 0.0–0.2)

## 2023-04-25 LAB — BASIC METABOLIC PANEL
Anion gap: 16 — ABNORMAL HIGH (ref 5–15)
BUN: 129 mg/dL — ABNORMAL HIGH (ref 6–20)
CO2: 36 mmol/L — ABNORMAL HIGH (ref 22–32)
Calcium: 9.4 mg/dL (ref 8.9–10.3)
Chloride: 83 mmol/L — ABNORMAL LOW (ref 98–111)
Creatinine, Ser: 2.27 mg/dL — ABNORMAL HIGH (ref 0.44–1.00)
GFR, Estimated: 26 mL/min — ABNORMAL LOW (ref >60)
Glucose, Bld: 207 mg/dL — ABNORMAL HIGH (ref 70–99)
Potassium: 3.7 mmol/L (ref 3.5–5.1)
Sodium: 135 mmol/L (ref 135–145)

## 2023-04-25 LAB — BRAIN NATRIURETIC PEPTIDE: B Natriuretic Peptide: 22.8 pg/mL (ref 0.0–100.0)

## 2023-04-25 LAB — CBG MONITORING, ED: Glucose-Capillary: 278 mg/dL — ABNORMAL HIGH (ref 70–99)

## 2023-04-25 LAB — TROPONIN I (HIGH SENSITIVITY): Troponin I (High Sensitivity): 34 ng/L — ABNORMAL HIGH (ref <18)

## 2023-04-25 MED ORDER — ONDANSETRON 4 MG PO TBDP
4.0000 mg | ORAL_TABLET | Freq: Once | ORAL | Status: AC
  Administered 2023-04-26: 4 mg via ORAL
  Filled 2023-04-25: qty 1

## 2023-04-25 MED ORDER — ONDANSETRON 4 MG PO TBDP
4.0000 mg | ORAL_TABLET | Freq: Once | ORAL | Status: AC
  Administered 2023-04-25: 4 mg via ORAL
  Filled 2023-04-25: qty 1

## 2023-04-25 MED ORDER — ACETAMINOPHEN 325 MG PO TABS
650.0000 mg | ORAL_TABLET | Freq: Once | ORAL | Status: AC
  Administered 2023-04-26: 650 mg via ORAL
  Filled 2023-04-25: qty 2

## 2023-04-25 NOTE — ED Provider Notes (Signed)
[facility]-EMERGENCY DEPT [facility] Emergency Department Provider Note MRN:  [id number]  Arrival date & time: 04/26/23     Chief Complaint   Twitching  History of Present Illness   [name] is a 47 y.o. year-old female presents to the ED with chief complaint of tremors.  She states that she was recently hospitalized for hyperglycemia and elevated BUN. She states that she "feels out of it ."  She denies fevers, chills, or SOB.  She denies cough, chest pain, or extremity swelling.   History provided by patient.   Review of Systems  Pertinent positive and negative review of systems noted in HPI.    Physical Exam   Vitals:   04/25/23 2200 04/25/23 2220  BP: 116/88 116/88  Pulse:  95  Resp:  20  Temp:  98.1 F (36.7 C)  SpO2:  96%    CONSTITUTIONAL:  chronically ill-appearing, NAD NEURO:  Alert and oriented x 3, CN 3-12 grossly intact, persistent twitching of extremities EYES:  eyes equal and reactive ENT/NECK:  Supple, no stridor  CARDIO:  normal rate, regular rhythm, appears well-perfused  PULM:  No respiratory distress, CTAB GI/GU:  non-distended, non-tender MSK/SPINE:  No gross deformities, no edema, moves all extremities  SKIN:  no rash, atraumatic   *Additional and/or pertinent findings included in MDM below  Diagnostic and Interventional Summary    EKG Interpretation  Date/Time:    Ventricular Rate:    PR Interval:    QRS Duration:   QT Interval:    QTC Calculation:   R Axis:     Text Interpretation:         Labs Reviewed  CBC WITH DIFFERENTIAL/PLATELET - Abnormal; Notable for the following components:      Result Value   RDW 15.6 (*)    All other components within normal limits  URINALYSIS, ROUTINE W REFLEX MICROSCOPIC - Abnormal; Notable for the following components:   APPearance CLOUDY (*)    Protein, ur 100 (*)    Leukocytes,Ua LARGE (*)    Bacteria, UA RARE (*)    Non Squamous Epithelial 0-5 (*)    All other components within normal  limits  BASIC METABOLIC PANEL - Abnormal; Notable for the following components:   Chloride 83 (*)    CO2 36 (*)    Glucose, Bld 207 (*)    BUN 129 (*)    Creatinine, Ser 2.27 (*)    GFR, Estimated 26 (*)    Anion gap 16 (*)    All other components within normal limits  RAPID URINE DRUG SCREEN, HOSP PERFORMED - Abnormal; Notable for the following components:   Cocaine POSITIVE (*)    All other components within normal limits  CBG MONITORING, ED - Abnormal; Notable for the following components:   Glucose-Capillary 278 (*)    All other components within normal limits  TROPONIN I (HIGH SENSITIVITY) - Abnormal; Notable for the following components:   Troponin I (High Sensitivity) 34 (*)    All other components within normal limits  TROPONIN I (HIGH SENSITIVITY) - Abnormal; Notable for the following components:   Troponin I (High Sensitivity) 34 (*)    All other components within normal limits  CULTURE, BLOOD (ROUTINE X 2)  CULTURE, BLOOD (ROUTINE X 2)  PREGNANCY, URINE  BRAIN NATRIURETIC PEPTIDE    DG Chest 2 View  Final Result      Medications  ciprofloxacin (CIPRO) IVPB 400 mg (400 mg Intravenous New Bag/Given 04/26/23 0256)  insulin aspart (novoLOG) injection  0-15 Units (has no administration in time range)  insulin aspart (novoLOG) injection 0-5 Units (has no administration in time range)  insulin degludec (TRESIBA) FlexTouch Pen SOPN (has no administration in time range)  metolazone (ZAROXOLYN) tablet 5 mg (has no administration in time range)  torsemide (DEMADEX) tablet 100 mg (has no administration in time range)  ondansetron (ZOFRAN-ODT) disintegrating tablet 4 mg (4 mg Oral Given 04/25/23 1841)  ondansetron (ZOFRAN-ODT) disintegrating tablet 4 mg (4 mg Oral Given 04/26/23 0021)  acetaminophen (TYLENOL) tablet 650 mg (650 mg Oral Given 04/26/23 0021)     Procedures  /  Critical Care Procedures  ED Course and Medical Decision Making  I have reviewed the triage vital  signs, the nursing notes, and pertinent available records from the EMR.  Social Determinants Affecting Complexity of Care: Patient has no clinically significant social determinants affecting this chief complaint..   ED Course:    Medical Decision Making Patient here for twitching.  She states that she is being monitored for high BUN.  She was recently admitted for the same by the family practice residency.  She states that she has not had any significant improvement in her symptoms.  She also reports worsening twitching.  Will check labs and reassess.  Amount and/or Complexity of Data Reviewed Labs: ordered.    Details: Labs are notable for negative pregnancy, positive cocaine on UDS, troponins are flat at 34, no active chest pain, doubt ACS. BUN 129, this is only mildly improved from her most recent labs.  Unable to gauge volume status secondary to body habitus.  She states that sometimes she feels foggy headed, but does not seem acutely confused right now.  Urinalysis worrisome for infection  Creatinine increased from most recent baseline Radiology: ordered.  Risk OTC drugs. Prescription drug management. Decision regarding hospitalization.     Consultants: I consulted with Hospitalist, Dr. Delton Coombes, who is appreciated for admitting.   Treatment and Plan: Patient's exam and diagnostic results are concerning for persistent uremia, new UTI, persistent muscle twitching.  Feel that patient will need admission to the hospital for further treatment and evaluation.  Patient discussed with attending physician, Dr. Madilyn Hook, who agrees with plan for admission.  Final Clinical Impressions(s) / ED Diagnoses     ICD-10-CM   1. Twitching  R25.3     2. Uremia  N19     3. Acute cystitis without hematuria  N30.00       ED Discharge Orders     None         Discharge Instructions Discussed with and Provided to Patient:   Discharge Instructions   None      Roxy Horseman,  PA-C 04/26/23 0310    Tilden Fossa, MD 04/26/23 [phone]

## 2023-04-25 NOTE — ED Triage Notes (Signed)
BIB EMS from home for tremors in all extremities. Seen at [facility] 2 weeks ago for same and they stated her tremors were from kidney disease. Pt reports worsening of tremors.  3L [place] at baseline.

## 2023-04-25 NOTE — ED Provider Triage Note (Signed)
Emergency Medicine Provider Triage Evaluation Note  [name] , a 47 y.o. female  was evaluated in triage.  Pt complains of tremors.  Patient was discharged from [facility] 2 weeks ago and states that she has had tremor since she was discharged.  Patient states she has not had any sick contacts or fevers but has been nauseous without emesis.  Patient did not take any medications for this.  Patient denies chest pain, shortness of breath, cough, dysuria, abdominal pain, recent travel  Review of Systems  Positive: See HPI Negative: See HPI  Physical Exam  BP 125/82   Pulse 95   Temp 98.1 F (36.7 C) (Oral)   Resp 20   Ht 5\' 2"  (1.575 m)   Wt (!) 156 kg   SpO2 94%   BMI 62.90 kg/m  Gen:   Awake, no distress   Resp:  Normal effort  MSK:   Moves extremities without difficulty  Other:    Medical Decision Making  Medically screening exam initiated at 6:42 PM.  Appropriate orders placed.  [name] was informed that the remainder of the evaluation will be completed by another provider, this initial triage assessment does not replace that evaluation, and the importance of remaining in the ED until their evaluation is complete.  Workup initiated, patient given Zofran for nausea, patient stable at this time   Remi Deter 04/25/23 1843

## 2023-04-25 NOTE — ED Notes (Signed)
Pt given water, and reminded that a urine sample was still needed.

## 2023-04-26 ENCOUNTER — Encounter (HOSPITAL_COMMUNITY): Payer: Self-pay | Admitting: Emergency Medicine

## 2023-04-26 DIAGNOSIS — G2581 Restless legs syndrome: Secondary | ICD-10-CM | POA: Diagnosis not present

## 2023-04-26 DIAGNOSIS — N390 Urinary tract infection, site not specified: Secondary | ICD-10-CM | POA: Diagnosis not present

## 2023-04-26 DIAGNOSIS — E785 Hyperlipidemia, unspecified: Secondary | ICD-10-CM | POA: Diagnosis not present

## 2023-04-26 DIAGNOSIS — J9611 Chronic respiratory failure with hypoxia: Secondary | ICD-10-CM | POA: Diagnosis not present

## 2023-04-26 DIAGNOSIS — K219 Gastro-esophageal reflux disease without esophagitis: Secondary | ICD-10-CM | POA: Diagnosis not present

## 2023-04-26 DIAGNOSIS — I129 Hypertensive chronic kidney disease with stage 1 through stage 4 chronic kidney disease, or unspecified chronic kidney disease: Secondary | ICD-10-CM | POA: Diagnosis not present

## 2023-04-26 DIAGNOSIS — E1122 Type 2 diabetes mellitus with diabetic chronic kidney disease: Secondary | ICD-10-CM | POA: Diagnosis not present

## 2023-04-26 DIAGNOSIS — T502X5A Adverse effect of carbonic-anhydrase inhibitors, benzothiadiazides and other diuretics, initial encounter: Secondary | ICD-10-CM | POA: Diagnosis not present

## 2023-04-26 DIAGNOSIS — I2510 Atherosclerotic heart disease of native coronary artery without angina pectoris: Secondary | ICD-10-CM | POA: Diagnosis not present

## 2023-04-26 DIAGNOSIS — F3181 Bipolar II disorder: Secondary | ICD-10-CM | POA: Diagnosis not present

## 2023-04-26 DIAGNOSIS — E1142 Type 2 diabetes mellitus with diabetic polyneuropathy: Secondary | ICD-10-CM | POA: Diagnosis not present

## 2023-04-26 DIAGNOSIS — Z8616 Personal history of COVID-19: Secondary | ICD-10-CM | POA: Diagnosis not present

## 2023-04-26 DIAGNOSIS — N1832 Chronic kidney disease, stage 3b: Secondary | ICD-10-CM | POA: Diagnosis not present

## 2023-04-26 DIAGNOSIS — G894 Chronic pain syndrome: Secondary | ICD-10-CM | POA: Diagnosis not present

## 2023-04-26 DIAGNOSIS — N184 Chronic kidney disease, stage 4 (severe): Secondary | ICD-10-CM | POA: Diagnosis not present

## 2023-04-26 DIAGNOSIS — Z794 Long term (current) use of insulin: Secondary | ICD-10-CM | POA: Diagnosis not present

## 2023-04-26 DIAGNOSIS — R253 Fasciculation: Secondary | ICD-10-CM | POA: Diagnosis not present

## 2023-04-26 DIAGNOSIS — N179 Acute kidney failure, unspecified: Secondary | ICD-10-CM | POA: Diagnosis not present

## 2023-04-26 DIAGNOSIS — R251 Tremor, unspecified: Secondary | ICD-10-CM | POA: Diagnosis not present

## 2023-04-26 DIAGNOSIS — E876 Hypokalemia: Secondary | ICD-10-CM | POA: Diagnosis not present

## 2023-04-26 DIAGNOSIS — Z89512 Acquired absence of left leg below knee: Secondary | ICD-10-CM | POA: Diagnosis not present

## 2023-04-26 DIAGNOSIS — I5032 Chronic diastolic (congestive) heart failure: Secondary | ICD-10-CM | POA: Diagnosis not present

## 2023-04-26 DIAGNOSIS — J4489 Other specified chronic obstructive pulmonary disease: Secondary | ICD-10-CM | POA: Diagnosis not present

## 2023-04-26 DIAGNOSIS — I130 Hypertensive heart and chronic kidney disease with heart failure and stage 1 through stage 4 chronic kidney disease, or unspecified chronic kidney disease: Secondary | ICD-10-CM | POA: Diagnosis not present

## 2023-04-26 DIAGNOSIS — E1169 Type 2 diabetes mellitus with other specified complication: Secondary | ICD-10-CM | POA: Diagnosis not present

## 2023-04-26 DIAGNOSIS — Z6841 Body Mass Index (BMI) 40.0 and over, adult: Secondary | ICD-10-CM | POA: Diagnosis not present

## 2023-04-26 DIAGNOSIS — E662 Morbid (severe) obesity with alveolar hypoventilation: Secondary | ICD-10-CM | POA: Diagnosis not present

## 2023-04-26 DIAGNOSIS — D649 Anemia, unspecified: Secondary | ICD-10-CM | POA: Diagnosis not present

## 2023-04-26 DIAGNOSIS — G459 Transient cerebral ischemic attack, unspecified: Secondary | ICD-10-CM | POA: Diagnosis not present

## 2023-04-26 DIAGNOSIS — E1165 Type 2 diabetes mellitus with hyperglycemia: Secondary | ICD-10-CM | POA: Diagnosis not present

## 2023-04-26 LAB — URINALYSIS, ROUTINE W REFLEX MICROSCOPIC
Bilirubin Urine: NEGATIVE
Glucose, UA: NEGATIVE mg/dL
Hgb urine dipstick: NEGATIVE
Ketones, ur: NEGATIVE mg/dL
Nitrite: NEGATIVE
Protein, ur: 100 mg/dL — AB
Specific Gravity, Urine: 1.011 (ref 1.005–1.030)
WBC, UA: >50 WBC/hpf (ref 0–5)
pH: 6 (ref 5.0–8.0)

## 2023-04-26 LAB — RAPID URINE DRUG SCREEN, HOSP PERFORMED
Amphetamines: NOT DETECTED
Barbiturates: NOT DETECTED
Benzodiazepines: NOT DETECTED
Cocaine: POSITIVE — AB
Opiates: NOT DETECTED
Tetrahydrocannabinol: NOT DETECTED

## 2023-04-26 LAB — PREGNANCY, URINE: Preg Test, Ur: NEGATIVE

## 2023-04-26 LAB — GLUCOSE, CAPILLARY
Glucose-Capillary: 348 mg/dL — ABNORMAL HIGH (ref 70–99)
Glucose-Capillary: 389 mg/dL — ABNORMAL HIGH (ref 70–99)
Glucose-Capillary: 254 mg/dL — ABNORMAL HIGH (ref 70–99)

## 2023-04-26 LAB — CBG MONITORING, ED
Glucose-Capillary: 288 mg/dL — ABNORMAL HIGH (ref 70–99)
Glucose-Capillary: 295 mg/dL — ABNORMAL HIGH (ref 70–99)

## 2023-04-26 LAB — TROPONIN I (HIGH SENSITIVITY): Troponin I (High Sensitivity): 34 ng/L — ABNORMAL HIGH (ref <18)

## 2023-04-26 MED ORDER — INSULIN ASPART 100 UNIT/ML IJ SOLN
0.0000 [IU] | Freq: Every day | INTRAMUSCULAR | Status: DC
  Administered 2023-04-26: 4 [IU] via SUBCUTANEOUS
  Administered 2023-04-27 – 2023-04-28 (×2): 5 [IU] via SUBCUTANEOUS
  Filled 2023-04-26: qty 0.05

## 2023-04-26 MED ORDER — LATANOPROST 0.005 % OP SOLN
1.0000 [drp] | Freq: Every day | OPHTHALMIC | Status: DC
  Administered 2023-04-26 – 2023-04-28 (×3): 1 [drp] via OPHTHALMIC
  Filled 2023-04-26: qty 2.5

## 2023-04-26 MED ORDER — LIDOCAINE 5 % EX PTCH
1.0000 | MEDICATED_PATCH | CUTANEOUS | Status: DC
  Administered 2023-04-27 – 2023-04-29 (×3): 1 via TRANSDERMAL
  Filled 2023-04-26 (×3): qty 1

## 2023-04-26 MED ORDER — METOLAZONE 5 MG PO TABS
5.0000 mg | ORAL_TABLET | Freq: Every day | ORAL | Status: DC
  Administered 2023-04-27: 5 mg via ORAL
  Filled 2023-04-26 (×2): qty 1

## 2023-04-26 MED ORDER — ROPINIROLE HCL 1 MG PO TABS
1.0000 mg | ORAL_TABLET | Freq: Every day | ORAL | Status: DC
  Administered 2023-04-26 – 2023-04-28 (×3): 1 mg via ORAL
  Filled 2023-04-26 (×3): qty 1

## 2023-04-26 MED ORDER — CIPROFLOXACIN IN D5W 400 MG/200ML IV SOLN: 400.0000 mg | Freq: Two times a day (BID) | INTRAVENOUS | Status: DC

## 2023-04-26 MED ORDER — PANTOPRAZOLE SODIUM 40 MG PO TBEC
40.0000 mg | DELAYED_RELEASE_TABLET | Freq: Every day | ORAL | Status: DC
  Administered 2023-04-26 – 2023-04-29 (×4): 40 mg via ORAL
  Filled 2023-04-26 (×4): qty 1

## 2023-04-26 MED ORDER — CIPROFLOXACIN IN D5W 400 MG/200ML IV SOLN
400.0000 mg | Freq: Once | INTRAVENOUS | Status: AC
  Administered 2023-04-26: 400 mg via INTRAVENOUS
  Filled 2023-04-26: qty 200

## 2023-04-26 MED ORDER — GABAPENTIN 300 MG PO CAPS
300.0000 mg | ORAL_CAPSULE | Freq: Three times a day (TID) | ORAL | Status: DC
  Administered 2023-04-26 (×2): 300 mg via ORAL
  Filled 2023-04-26 (×2): qty 1

## 2023-04-26 MED ORDER — BRINZOLAMIDE-BRIMONIDINE 1-0.2 % OP SUSP
1.0000 [drp] | Freq: Three times a day (TID) | OPHTHALMIC | Status: DC
  Administered 2023-04-26 – 2023-04-29 (×8): 1 [drp] via OPHTHALMIC

## 2023-04-26 MED ORDER — SODIUM CHLORIDE 0.9 % IV SOLN
2.0000 g | INTRAVENOUS | Status: DC
  Administered 2023-04-26 – 2023-04-28 (×3): 2 g via INTRAVENOUS
  Filled 2023-04-26 (×3): qty 20

## 2023-04-26 MED ORDER — NETARSUDIL DIMESYLATE 0.02 % OP SOLN
1.0000 [drp] | Freq: Every day | OPHTHALMIC | Status: DC
  Administered 2023-04-27: 1 [drp] via OPHTHALMIC

## 2023-04-26 MED ORDER — DULOXETINE HCL 60 MG PO CPEP
120.0000 mg | ORAL_CAPSULE | Freq: Every day | ORAL | Status: DC
  Administered 2023-04-26: 120 mg via ORAL
  Filled 2023-04-26 (×5): qty 2

## 2023-04-26 MED ORDER — ONDANSETRON HCL 4 MG/2ML IJ SOLN
4.0000 mg | Freq: Four times a day (QID) | INTRAMUSCULAR | Status: DC | PRN
  Administered 2023-04-26 – 2023-04-29 (×7): 4 mg via INTRAVENOUS
  Filled 2023-04-26 (×8): qty 2

## 2023-04-26 MED ORDER — TORSEMIDE 100 MG PO TABS
100.0000 mg | ORAL_TABLET | Freq: Two times a day (BID) | ORAL | Status: DC
  Administered 2023-04-26: 100 mg via ORAL
  Filled 2023-04-26 (×2): qty 1

## 2023-04-26 MED ORDER — INSULIN DEGLUDEC 200 UNIT/ML ~~LOC~~ SOPN: 70.0000 mL | PEN_INJECTOR | Freq: Two times a day (BID) | SUBCUTANEOUS | Status: DC

## 2023-04-26 MED ORDER — HYDROMORPHONE HCL 1 MG/ML IJ SOLN: 1.0000 mg | INTRAMUSCULAR | Status: DC | PRN

## 2023-04-26 MED ORDER — GABAPENTIN 300 MG PO CAPS: 600.0000 mg | ORAL_CAPSULE | Freq: Three times a day (TID) | ORAL | Status: DC

## 2023-04-26 MED ORDER — OXYCODONE HCL 5 MG PO TABS
5.0000 mg | ORAL_TABLET | Freq: Four times a day (QID) | ORAL | Status: AC | PRN
  Administered 2023-04-26 – 2023-04-28 (×3): 5 mg via ORAL
  Filled 2023-04-26 (×3): qty 1

## 2023-04-26 MED ORDER — AMLODIPINE BESYLATE 10 MG PO TABS
10.0000 mg | ORAL_TABLET | Freq: Every day | ORAL | Status: DC
  Administered 2023-04-26 – 2023-04-29 (×4): 10 mg via ORAL
  Filled 2023-04-26 (×4): qty 1

## 2023-04-26 MED ORDER — VITAMIN D 25 MCG (1000 UNIT) PO TABS
1000.0000 [IU] | ORAL_TABLET | Freq: Every day | ORAL | Status: DC
  Administered 2023-04-26 – 2023-04-29 (×4): 1000 [IU] via ORAL
  Filled 2023-04-26 (×4): qty 1

## 2023-04-26 MED ORDER — ROSUVASTATIN CALCIUM 10 MG PO TABS
10.0000 mg | ORAL_TABLET | Freq: Every day | ORAL | Status: DC
  Administered 2023-04-26 – 2023-04-29 (×4): 10 mg via ORAL
  Filled 2023-04-26 (×4): qty 1

## 2023-04-26 MED ORDER — INSULIN ASPART 100 UNIT/ML IJ SOLN
0.0000 [IU] | Freq: Three times a day (TID) | INTRAMUSCULAR | Status: DC
  Administered 2023-04-26 (×2): 8 [IU] via SUBCUTANEOUS
  Administered 2023-04-26: 15 [IU] via SUBCUTANEOUS
  Administered 2023-04-27: 8 [IU] via SUBCUTANEOUS
  Administered 2023-04-27: 11 [IU] via SUBCUTANEOUS
  Administered 2023-04-28 (×2): 8 [IU] via SUBCUTANEOUS
  Administered 2023-04-28: 5 [IU] via SUBCUTANEOUS
  Administered 2023-04-29 (×3): 8 [IU] via SUBCUTANEOUS
  Filled 2023-04-26: qty 0.15

## 2023-04-26 MED ORDER — ASPIRIN 81 MG PO TBEC
81.0000 mg | DELAYED_RELEASE_TABLET | Freq: Every day | ORAL | Status: DC
  Administered 2023-04-26 – 2023-04-29 (×4): 81 mg via ORAL
  Filled 2023-04-26 (×4): qty 1

## 2023-04-26 MED ORDER — HYDROMORPHONE HCL 1 MG/ML IJ SOLN
0.5000 mg | INTRAMUSCULAR | Status: AC | PRN
  Administered 2023-04-26 – 2023-04-27 (×2): 0.5 mg via INTRAVENOUS
  Filled 2023-04-26 (×2): qty 0.5

## 2023-04-26 MED ORDER — PREDNISOLONE ACETATE 1 % OP SUSP
1.0000 [drp] | Freq: Four times a day (QID) | OPHTHALMIC | Status: DC
  Administered 2023-04-26 – 2023-04-29 (×9): 1 [drp] via OPHTHALMIC

## 2023-04-26 MED ORDER — HEPARIN SODIUM (PORCINE) 5000 UNIT/ML IJ SOLN
5000.0000 [IU] | Freq: Three times a day (TID) | INTRAMUSCULAR | Status: DC
  Administered 2023-04-26 – 2023-04-29 (×9): 5000 [IU] via SUBCUTANEOUS
  Filled 2023-04-26 (×9): qty 1

## 2023-04-26 MED ORDER — ONDANSETRON HCL 4 MG PO TABS: 4.0000 mg | ORAL_TABLET | Freq: Four times a day (QID) | ORAL | Status: DC | PRN

## 2023-04-26 MED ORDER — ACETAMINOPHEN 325 MG PO TABS
650.0000 mg | ORAL_TABLET | Freq: Four times a day (QID) | ORAL | Status: DC | PRN
  Administered 2023-04-26 – 2023-04-28 (×4): 650 mg via ORAL
  Filled 2023-04-26 (×5): qty 2

## 2023-04-26 MED ORDER — INSULIN GLARGINE-YFGN 100 UNIT/ML ~~LOC~~ SOLN
70.0000 [IU] | Freq: Two times a day (BID) | SUBCUTANEOUS | Status: DC
  Administered 2023-04-26 – 2023-04-29 (×7): 70 [IU] via SUBCUTANEOUS
  Filled 2023-04-26 (×9): qty 0.7

## 2023-04-26 MED ORDER — DICLOFENAC SODIUM 1 % EX GEL
2.0000 g | Freq: Four times a day (QID) | CUTANEOUS | Status: DC
  Administered 2023-04-26 – 2023-04-29 (×9): 2 g via TOPICAL
  Filled 2023-04-26: qty 100

## 2023-04-26 MED ORDER — PREDNISOLONE ACETATE 1 % OP SUSP
1.0000 [drp] | Freq: Four times a day (QID) | OPHTHALMIC | Status: DC
  Filled 2023-04-26: qty 5

## 2023-04-26 MED ORDER — INSULIN DEGLUDEC 200 UNIT/ML ~~LOC~~ SOPN: PEN_INJECTOR | Freq: Two times a day (BID) | SUBCUTANEOUS | Status: DC

## 2023-04-26 MED ORDER — SODIUM CHLORIDE 0.9 % IV SOLN: INTRAVENOUS | Status: AC

## 2023-04-26 MED ORDER — ACETAMINOPHEN 650 MG RE SUPP: 650.0000 mg | Freq: Four times a day (QID) | RECTAL | Status: DC | PRN

## 2023-04-26 NOTE — H&P (Signed)
History and Physical    Patient: [name] ZOX:[id number] DOB: [birth year] DOA: 04/25/2023 DOS: the patient was seen and examined on 04/26/2023 PCP: Latrelle Dodrill, MD  Patient coming from: Home  Chief Complaint:  Chief Complaint  Patient presents with   Tremors   HPI: [name] is a 47 y.o. female with medical history significant of left groin abscess, chronic diastolic heart failure, nonobstructive CAD, left foot osteomyelitis, left BKA, OSA on CPAP, obesity hypoventilation, asthma/COPD, chronic respiratory failure with hypoxia, history of chest pain, costochondritis, history of acute tubular necrosis, bipolar 2 disorder, right carpal tunnel syndrome, chronic pain syndrome, costochondritis, depression, drug-seeking behavior, depression, type II DM, GERD, hyperlipidemia, GERD, hypertension, morbid obesity, peripheral neuropathy, history of pneumonia, rectal fissure, restless leg syndrome, history of SVT, syncopal episode, UTI, urinary frequency, urinary incontinence, vaginal candidiasis, varicose veins who presented to the emergency department with tremors, mild dysuria and urinary frequency.  No fever, chills or night sweats. No sore throat, rhinorrhea, dyspnea, wheezing or hemoptysis.  No chest pain, palpitations, diaphoresis, PND, orthopnea or pitting edema of the lower extremities.  No  abdominal pain, diarrhea, constipation, melena or hematochezia.  No polyuria, polydipsia, polyphagia or blurred vision.  ED course: Initial vital signs were temperature 98.1 F, pulse 95, respiration 20, BP 125/82 mmHg and O2 sat 94% on nasal cannula at 3 LPM.  The patient received acetaminophen 650 mg p.o. x 1, ciprofloxacin 400 mg IVPB and ondansetron 4 mg ODT p.o. x 2.  Lab work: Her urinalysis was cloudy with 100 mg deciliter of protein, large leukocyte esterase, more than 50 WBC, rare bacteria, positive WBC clumps and budding yeast on microscopic examination.  UDS was positive for cocaine.  Urine  pregnancy test was negative.  CBC was normal with a white count of 10.3 with 56% neutrophils, hemoglobin 12.8 g/dL platelets 811.  Troponin x 2 was 34 ng/L.  BNP 22.8 pg/mL.  BMP showed a sodium 135, potassium 3.7, chloride 83 and CO2 36 mmol/L with an anion gap of 16.  Glucose 207, BUN 129, creatinine 2.27 and calcium 9.4 mg/dL.  Imaging: 2 view chest radiograph showing low volumes, normal heart size and mediastinal contours with no acute process.   Review of Systems: As mentioned in the history of present illness. All other systems reviewed and are negative. Past Medical History:  Diagnosis Date   Abscess of groin, left    Acute on chronic diastolic (congestive) heart failure ([name]) 02/05/2022   Acute on chronic respiratory failure with hypoxia ([name]) 04/11/2022   Acute osteomyelitis of ankle or foot, left ([name]) 01/31/2018   Acute respiratory failure with hypoxia and hypercarbia ([name]) 06/15/2020   Acute tubular necrosis ([name]) 07/12/2022   Alveolar hypoventilation    Amputation stump infection ([name]) 04/09/2018   Anemia    not on iron pill   Asthma    Bipolar 2 disorder ([name])    Candidiasis of vagina 05/15/2022   Carpal tunnel syndrome on right    recurrent   Cellulitis 08/2010-08/2011   Chest pain with low risk for cardiac etiology 05/11/2021   Chronic pain    COPD (chronic obstructive pulmonary disease) ([name])    Symbicort daily and Proventil as needed   Costochondritis    De Quervain's tenosynovitis, bilateral 11/01/2015   Depression    Diabetes mellitus type II, uncontrolled 2000   Type 2, Uncontrolled.Takes Lantus daily.Fasting blood sugar runs 150   Diabetic ulcer of right foot ([name]) 12/19/2021   Drug-seeking behavior    Elevated troponin  Exposure to mold 04/21/2020   Family history of metabolic acidosis with increased anion gap 04/12/2023   GERD (gastroesophageal reflux disease)    takes Pantoprazole and Zantac daily   HLD (hyperlipidemia)    takes Atorvastatin daily    Hypertension    takes Lisinopril and Coreg daily   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  Mild diffuse proximal LAD stenosis with no evidence of obstructive disease (20 to 30% diffuse stenosis);  2.  Left dominant circumflex with widely patent obtuse marginal branches and left PDA branch, no significant stenoses present  3.  Nondominant RCA with mild diffuse nonobstructive stenosis    OSA on CPAP    Peripheral neuropathy    takes Gabapentin daily   Pneumonia    "walking" several yrs ago and as a baby (12/05/2018)   Pneumonia due to COVID-19 virus 04/14/2021   Primary osteoarthritis of first carpometacarpal joint of left hand 07/30/2016   Rectal fissure    Restless leg    Right carpal tunnel syndrome 09/01/2011   SVT (supraventricular tachycardia)    Syncope 02/25/2016   Thrombocytosis 04/11/2022   Urinary frequency    Urinary incontinence 10/23/2020   UTI (urinary tract infection) 04/15/2021   Varicose veins    Right medial thigh and Left leg    Past Surgical History:  Procedure Laterality Date   AMPUTATION Left 02/01/2018   Procedure: LEFT FOURTH AND 5TH TOE RAY AMPUTATION;  Surgeon: Nadara Mustard, MD;  Location: [facility] OR;  Service: Orthopedics;  Laterality: Left;   AMPUTATION Left 03/03/2018   Procedure: LEFT BELOW KNEE AMPUTATION;  Surgeon: Nadara Mustard, MD;  Location: [facility] OR;  Service: Orthopedics;  Laterality: Left;   AMPUTATION Right 05/21/2022   Procedure: REVISION AMPUTATION RAY 1ST;  Surgeon: Candelaria Stagers, DPM;  Location: [facility] OR;  Service: Podiatry;  Laterality: Right;   CARPAL TUNNEL RELEASE Bilateral    CESAREAN SECTION  2007   CORONARY ANGIOGRAPHY N/A 11/26/2020   Procedure: CORONARY ANGIOGRAPHY;  Surgeon:  Tonny Bollman, MD;  Location: [facility] INVASIVE CV LAB;  Service: Cardiovascular;  Laterality: N/A;   FOOT AMPUTATION Right    INCISION AND DRAINAGE ABSCESS Left 10/16/2021   Procedure: INCISION AND DRAINAGE ABSCESS;  Surgeon: Fritzi Mandes, MD;  Location[facility] OR;  Service: General;  Laterality: Left;   INCISION AND DRAINAGE PERIRECTAL ABSCESS Left 05/18/2019   Procedure: IRRIGATION AND DEBRIDEMENT OF PANNIS ABSCESS, POSSIBLE DEBRIDEMENT OF BUTTOCK WOUND;  Surgeon: Manus Rudd, MD;  Location: [facility] OR;  Service: General;  Laterality: Left;   IRRIGATION AND DEBRIDEMENT BUTTOCKS Left 05/17/2019   Procedure: IRRIGATION AND DEBRIDEMENT BUTTOCKS;  Surgeon: Manus Rudd, MD;  Location: [facility] OR;  Service: General;  Laterality: Left;   KNEE ARTHROSCOPY Right 07/17/2010   LEFT HEART CATHETERIZATION WITH CORONARY ANGIOGRAM N/A 07/27/2012   Procedure: LEFT HEART CATHETERIZATION WITH CORONARY ANGIOGRAM;  Surgeon: Tonny Bollman, MD;  Location: [facility] CATH LAB;  Service: Cardiovascular;  Laterality: N/A;   MASS EXCISION N/A 06/29/2013   Procedure:  WIDE LOCAL EXCISION OF POSTERIOR NECK ABSCESS;  Surgeon: Axel Filler, MD;  Location: [facility] OR;  Service: General;  Laterality: N/A;   REPAIR KNEE LIGAMENT Left    "fixed ligaments and chipped patella"   right transmetatarsal  amputation      TRACHEOSTOMY TUBE PLACEMENT N/A 07/10/2022   Procedure: CONTROLLED EXTUBATION;  Surgeon: Serena Colonel, MD;  Location: [facility] OR;  Service: ENT;  Laterality: N/A;   Social History:  reports that she quit smoking about 17 years ago. Her smoking use included cigarettes. She has a 3.75 pack-year smoking history. She has never used smokeless tobacco. She reports that she does not currently use alcohol. She reports current drug use. Drug: "Crack" cocaine.  Allergies  Allergen Reactions   Cefepime Other (See Comments)    AKI, see records from [facility] hospitalization in January 2020.   Other reaction(s): Other (See Comments) "Shut down  organs/kidneys"  Note pt has tolerated Rocephin and Keflex Other reaction(s): Not available   Iodine Other (See Comments)    Kidney dysfunction   Kiwi Extract Shortness Of Breath, Swelling and Anaphylaxis    Other reaction(s): Not available   Morphine And Codeine Nausea And Vomiting   Pentoxifylline Nausea And Vomiting    Trental   Cephalosporins Other (See Comments)    Pt states that they cause her kidneys to shut down   Toradol [Ketorolac Tromethamine] Other (See Comments)    Feels like something is crawling on me   Nubain [Nalbuphine Hcl] Other (See Comments)    "FEELS LIKE SOMETHING CRAWLING ON ME" Hallucinations     Family History  Problem Relation Age of Onset   Diabetes Mother    Hyperlipidemia Mother    Depression Mother    GER disease Mother    Allergic rhinitis Mother    Restless legs syndrome Mother    Heart attack Paternal Uncle    Heart disease Paternal Grandmother    Heart attack Paternal Grandmother    Heart attack Paternal Grandfather    Heart disease Paternal Grandfather    Heart attack Father    Migraines Sister    Cancer Maternal Grandmother        COLON   Hypertension Maternal Grandmother    Hyperlipidemia Maternal Grandmother    Diabetes Maternal Grandmother    Other Maternal Grandfather        GUN SHOT   Anxiety disorder Sister    Asthma Child     Prior to Admission medications   Medication Sig Start Date End Date Taking? Authorizing Provider  acetaminophen (TYLENOL) 325 MG tablet Take 1-2 tablets (325-650 mg total) by mouth every 4 (four) hours as needed for mild pain. 06/04/22   Setzer, Lynnell Jude, PA-C  albuterol (VENTOLIN HFA) 108 (90 Base) MCG/ACT inhaler Inhale 2 puffs into the lungs every 6 (six) hours as needed for wheezing or shortness of breath. 03/28/23   Latrelle Dodrill, MD  amLODipine (NORVASC) 10 MG tablet Take 1 tablet (10 mg total) by mouth daily. 09/27/22   Latrelle Dodrill, MD  aspirin EC 81 MG tablet Take 1 tablet (81 mg  total) by mouth daily. Swallow whole. 11/19/20   Tonny Bollman, MD  Brinzolamide-Brimonidine [facility]) 1-0.2 % SUSP Place 1 drop into the left eye in the morning, at noon, and at bedtime.    [provider]  budesonide-formoterol (SYMBICORT) 160-4.5 MCG/ACT inhaler Inhale 2 puffs into the lungs 2 (two) times daily. 03/01/23   Latrelle Dodrill, MD  cetirizine (ZYRTEC) 10 MG tablet Take 1 tablet (10 mg total) by mouth daily as needed for allergies. 02/11/22   Latrelle Dodrill, MD  Continuous Blood Gluc Sensor (DEXCOM G6 SENSOR) MISC Inject 1 applicator into the skin as directed. Change sensor every 10  days. 11/05/21   Latrelle Dodrill, MD  Continuous Blood Gluc Transmit (DEXCOM G6 TRANSMITTER) MISC Inject 1 Device into the skin as directed. Reuse 8 times with sensor changes. 09/03/21   Latrelle Dodrill, MD  diclofenac Sodium (VOLTAREN) 1 % GEL Apply 2 g topically 4 (four) times daily. 02/26/23   Fayette Pho, MD  DULoxetine (CYMBALTA) 60 MG capsule TAKE 2 CAPSULES BY MOUTH DAILY 03/28/23   Latrelle Dodrill, MD  gabapentin (NEURONTIN) 300 MG capsule Take 2 capsules (600 mg total) by mouth 3 (three) times daily. 04/14/23 05/14/23  Alfredo Martinez, MD  GNP ULTICARE PEN NEEDLES 32G X 4 MM MISC USE TO inject insulin 2 TIMES DAILY 10/02/21   Latrelle Dodrill, MD  insulin aspart (NOVOLOG FLEXPEN) 100 UNIT/ML FlexPen Inject 20 Units into the skin 3 (three) times daily with meals. Patient taking differently: Inject 30 Units into the skin 3 (three) times daily with meals. 05/25/22   Maury Dus, MD  insulin degludec (TRESIBA FLEXTOUCH) 200 UNIT/ML FlexTouch Pen Inject 70 Units into the skin 2 (two) times daily. 04/14/23   Alfredo Martinez, MD  Insulin Disposable Pump (OMNIPOD 5 G6 POD, GEN 5,) MISC Inject into the skin daily. 07/30/22   [provider]  ipratropium-albuterol (DUONEB) 0.5-2.5 (3) MG/3ML SOLN Take 3 mLs by nebulization every 6 (six) hours as needed. Patient taking  differently: Take 3 mLs by nebulization every 6 (six) hours as needed (for shortness of breath and wheezing). 09/24/22   Charlott Holler, MD  latanoprost (XALATAN) 0.005 % ophthalmic solution Place 1 drop into both eyes daily. 02/04/23   [provider]  lidocaine (LIDODERM) 5 % Place 1 patch onto the skin daily. Remove & Discard patch within 12 hours or as directed by MD 02/27/23   Fayette Pho, MD  metolazone (ZAROXOLYN) 5 MG tablet Take 1 tablet (5 mg total) by mouth daily. 04/21/23   Alicia Amel, MD  Multiple Vitamins-Minerals (ONE-A-DAY WOMENS) tablet Take 1 tablet by mouth daily.    [provider]  nitroGLYCERIN (NITROSTAT) 0.4 MG SL tablet Place 1 tablet (0.4 mg total) under the tongue every 5 (five) minutes as needed for chest pain. 11/04/22   Latrelle Dodrill, MD  nystatin (MYCOSTATIN/NYSTOP) powder Apply 1 Application topically 3 (three) times daily. 03/07/23   Latrelle Dodrill, MD  omeprazole (PRILOSEC) 40 MG capsule Take 1 capsule (40 mg total) by mouth in the morning and at bedtime. 04/07/23   Charlott Holler, MD  ondansetron (ZOFRAN-ODT) 4 MG disintegrating tablet Take 1 tablet (4 mg total) by mouth every 8 (eight) hours as needed for nausea or vomiting. 04/21/23   Alicia Amel, MD  RHOPRESSA 0.02 % SOLN Place 1 drop into the left eye daily. 01/21/23   [provider]  rOPINIRole (REQUIP) 1 MG tablet Take 1 tablet (1 mg total) by mouth at bedtime. 12/16/22   Latrelle Dodrill, MD  rosuvastatin (CRESTOR) 10 MG tablet Take 1 tablet (10 mg total) by mouth daily. 04/14/23   Alfredo Martinez, MD  torsemide (DEMADEX) 100 MG tablet Take 1 tablet (100 mg total) by mouth 2 (two) times daily. 03/22/23   Latrelle Dodrill, MD  traZODone (DESYREL) 100 MG tablet TAKE 1 TABLET BY MOUTH AT BEDTIME AS NEEDED FOR SLEEP 03/03/23   Latrelle Dodrill, MD  Vitamin D, Cholecalciferol, 25 MCG (1000 UT) CAPS Take 1,000 Units by mouth daily. 10/05/22   Latrelle Dodrill,  MD    Physical Exam:  Vitals:   04/25/23 2220 04/26/23 0304 04/26/23 0330 04/26/23 0636  BP: 116/88 103/89  125/61  Pulse: 95 96  96  Resp: 20 17  16   Temp: 98.1 F (36.7 C)  98.5 F (36.9 C) 97.7 F (36.5 C)  TempSrc:   Oral Oral  SpO2: 96% 94%  94%  Weight:      Height:       Physical Exam Constitutional:      General: She is awake. She is not in acute distress.    Appearance: She is morbidly obese. She is ill-appearing.     Interventions: Face mask in place.  HENT:     Head: Normocephalic.     Nose: No rhinorrhea.     Mouth/Throat:     Mouth: Mucous membranes are moist.  Eyes:     General: No scleral icterus.    Pupils: Pupils are equal, round, and reactive to light.  Neck:     Vascular: No JVD.  Cardiovascular:     Rate and Rhythm: Normal rate and regular rhythm.     Heart sounds: S1 normal and S2 normal.  Pulmonary:     Effort: Pulmonary effort is normal.     Breath sounds: Normal breath sounds. No wheezing, rhonchi or rales.  Abdominal:     General: Bowel sounds are normal. There is no distension.     Palpations: Abdomen is soft.     Tenderness: There is abdominal tenderness in the suprapubic area. There is no guarding.  Musculoskeletal:     Cervical back: Neck supple.     Right lower leg: No edema.     Left lower leg: No edema.  Skin:    General: Skin is warm and dry.  Neurological:     General: No focal deficit present.     Mental Status: She is alert and oriented to person, place, and time.  Psychiatric:        Mood and Affect: Mood normal.        Behavior: Behavior normal. Behavior is cooperative.     Data Reviewed:  Results are pending, will review when available.  Assessment and Plan: Principal Problem:   Acute UTI (urinary tract infection) Admit to telemetry/inpatient. Has tolerated ceftriaxone in the past. Discontinue ciprofloxacin IV. Begin ceftriaxone 2 g IVPB daily. Follow-up urine culture and sensitivity.  Active Problems:    Type 2 diabetes mellitus with hyperglycemia ([name]) Carbohydrate modified diet. Continue insulin degludec 70 units SQ twice daily. CBG monitoring with RI SS.    Obstructive sleep apnea (adult) (pediatric) CPAP at bedtime.    Obesity hypoventilation syndrome ([name]) CPAP during naps and as needed during day hours.    Hypertension Continue amlodipine 10 mg p.o. daily. Continue torsemide 100 mg p.o. twice daily. Continue metolazone 5 mg p.o. daily.    GERD On pantoprazole 40 mg p.o. daily.    Restless leg syndrome Continue rec with 1 mg p.o. bedtime.    Hyperlipidemia associated with type 2 diabetes mellitus ([name]) Continue rosuvastatin 10 mg p.o. daily.     Advance Care Planning:   Code Status: Full Code   Consults:   Family Communication:   Severity of Illness: The appropriate patient status for this patient is INPATIENT. Inpatient status is judged to be reasonable and necessary in order to provide the required intensity of service to ensure the patient's safety. The patient's presenting symptoms, physical exam findings, and initial radiographic and laboratory data in the context of their chronic comorbidities is felt to  place them at high risk for further clinical deterioration. Furthermore, it is not anticipated that the patient will be medically stable for discharge from the hospital within 2 midnights of admission.   * I certify that at the point of admission it is my clinical judgment that the patient will require inpatient hospital care spanning beyond 2 midnights from the point of admission due to high intensity of service, high risk for further deterioration and high frequency of surveillance required.*  Author: Bobette Mo, MD 04/26/2023 7:10 AM  For on call review [email or web address].   This document was prepared using Dragon voice recognition software and may contain some unintended transcription errors.

## 2023-04-26 NOTE — Progress Notes (Signed)
Pharmacy Antibiotic Note  [name] is a 47 y.o. female admitted on 04/25/2023 with UTI, muscle twitching.  Pharmacy has been consulted for Cipro dosing.  Plan: Cipro 400mg  IV q12h Need for further dosage adjustment appears unlikely at present.    Will sign off at this time.  Please reconsult if a change in clinical status warrants re-evaluation of dosage.   Height: 5\' 2"  (157.5 cm) Weight: (!) 156 kg (343 lb 14.7 oz) IBW/kg (Calculated) : 50.1  Temp (24hrs), Avg:98.2 F (36.8 C), Min:98.1 F (36.7 C), Max:98.5 F (36.9 C)  Recent Labs  Lab 04/21/23 1530 04/25/23 1856  WBC  --  10.3  CREATININE 1.96* 2.27*    Estimated Creatinine Clearance: 45.7 mL/min (A) (by C-G formula based on SCr of 2.27 mg/dL (H)).    Allergies  Allergen Reactions   Cefepime Other (See Comments)    AKI, see records from [facility] hospitalization in January 2020.   Other reaction(s): Other (See Comments) "Shut down organs/kidneys"  Note pt has tolerated Rocephin and Keflex Other reaction(s): Not available   Iodine Other (See Comments)    Kidney dysfunction   Kiwi Extract Shortness Of Breath, Swelling and Anaphylaxis    Other reaction(s): Not available   Morphine And Codeine Nausea And Vomiting   Pentoxifylline Nausea And Vomiting    Trental   Cephalosporins Other (See Comments)    Pt states that they cause her kidneys to shut down   Toradol [Ketorolac Tromethamine] Other (See Comments)    Feels like something is crawling on me   Nubain [Nalbuphine Hcl] Other (See Comments)    "FEELS LIKE SOMETHING CRAWLING ON ME" Hallucinations      Thank you for allowing pharmacy to be a part of this patient's care.  Maryellen Pile, PharmD 04/26/2023 3:37 AM

## 2023-04-26 NOTE — ED Notes (Signed)
ED TO INPATIENT HANDOFF REPORT  ED Nurse Name and Phone #: Trudie Reed Name/Age/Gender [name] 47 y.o. female Room/Bed: WA02/WA02  Code Status   Code Status: Prior  Home/SNF/Other Home Patient oriented to: self, place, time, and situation Is this baseline? Yes   Triage Complete: Triage complete  Chief Complaint Acute UTI (urinary tract infection) [N39.0]  Triage Note BIB EMS from home for tremors in all extremities. Seen at [facility] 2 weeks ago for same and they stated her tremors were from kidney disease. Pt reports worsening of tremors.  3L [place] at baseline.    Allergies Allergies  Allergen Reactions   Cefepime Other (See Comments)    AKI, see records from [facility] hospitalization in January 2020.   Other reaction(s): Other (See Comments) "Shut down organs/kidneys"  Note pt has tolerated Rocephin and Keflex Other reaction(s): Not available   Iodine Other (See Comments)    Kidney dysfunction   Kiwi Extract Shortness Of Breath, Swelling and Anaphylaxis    Other reaction(s): Not available   Morphine And Codeine Nausea And Vomiting   Pentoxifylline Nausea And Vomiting    Trental   Cephalosporins Other (See Comments)    Pt states that they cause her kidneys to shut down   Toradol [Ketorolac Tromethamine] Other (See Comments)    Feels like something is crawling on me   Nubain [Nalbuphine Hcl] Other (See Comments)    "FEELS LIKE SOMETHING CRAWLING ON ME" Hallucinations     Level of Care/Admitting Diagnosis ED Disposition     ED Disposition  Admit   Condition  --   Comment  Hospital Area: [facility] [[id number]]  Level of Care: Telemetry [5]  Admit to tele based on following criteria: Monitor for Ischemic changes  May [facility] to [name] or [facility] if equivalent level of care is available:: No  Covid Evaluation: Asymptomatic - no recent exposure (last 10 days) testing not required  Diagnosis: Acute UTI (urinary tract infection)  [[id number]]  Admitting Physician: Bobette Mo [[id number]]  Attending Physician: Bobette Mo [[id number]]  Certification:: I certify this patient will need inpatient services for at least 2 midnights  Estimated Length of Stay: 2          B Medical/Surgery History Past Medical History:  Diagnosis Date   Abscess of groin, left    Acute on chronic diastolic (congestive) heart failure ([name]) 02/05/2022   Acute on chronic respiratory failure with hypoxia ([name]) 04/11/2022   Acute osteomyelitis of ankle or foot, left ([name]) 01/31/2018   Acute respiratory failure with hypoxia and hypercarbia ([name]) 06/15/2020   Acute tubular necrosis ([name]) 07/12/2022   Alveolar hypoventilation    Amputation stump infection ([name]) 04/09/2018   Anemia    not on iron pill   Asthma    Bipolar 2 disorder ([name])    Candidiasis of vagina 05/15/2022   Carpal tunnel syndrome on right    recurrent   Cellulitis 08/2010-08/2011   Chest pain with low risk for cardiac etiology 05/11/2021   Chronic pain    COPD (chronic obstructive pulmonary disease) ([name])    Symbicort daily and Proventil as needed   Costochondritis    De Quervain's tenosynovitis, bilateral 11/01/2015   Depression    Diabetes mellitus type II, uncontrolled 2000   Type 2, Uncontrolled.Takes Lantus daily.Fasting blood sugar runs 150   Diabetic ulcer of right foot ([name]) 12/19/2021   Drug-seeking behavior    Elevated troponin    Exposure to mold 04/21/2020  Family history of metabolic acidosis with increased anion gap 04/12/2023   GERD (gastroesophageal reflux disease)    takes Pantoprazole and Zantac daily   HLD (hyperlipidemia)    takes Atorvastatin daily   Hypertension    takes Lisinopril and Coreg daily   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[name]) 04/11/2019   Left shoulder pain 06/23/2019   Ludwig's angina 07/05/2022   Morbid obesity ([name])    Morbid obesity with BMI of 60.0-69.9, adult ([name]) 04/15/2021   Nocturia    Nonobstructive  atherosclerosis of coronary artery 11/26/2020   11/26/20 Coronary angiogram: 1.  Mild diffuse proximal LAD stenosis with no evidence of obstructive disease (20 to 30% diffuse stenosis);  2.  Left dominant circumflex with widely patent obtuse marginal branches and left PDA branch, no significant stenoses present  3.  Nondominant RCA with mild diffuse nonobstructive stenosis    OSA on CPAP    Peripheral neuropathy    takes Gabapentin daily   Pneumonia    "walking" several yrs ago and as a baby (12/05/2018)   Pneumonia due to COVID-19 virus 04/14/2021   Primary osteoarthritis of first carpometacarpal joint of left hand 07/30/2016   Rectal fissure    Restless leg    Right carpal tunnel syndrome 09/01/2011   SVT (supraventricular tachycardia)    Syncope 02/25/2016   Thrombocytosis 04/11/2022   Urinary frequency    Urinary incontinence 10/23/2020   UTI (urinary tract infection) 04/15/2021   Varicose veins    Right medial thigh and Left leg    Past Surgical History:  Procedure Laterality Date   AMPUTATION Left 02/01/2018   Procedure: LEFT FOURTH AND 5TH TOE RAY AMPUTATION;  Surgeon: Nadara Mustard, MD;  Location: [facility] OR;  Service: Orthopedics;  Laterality: Left;   AMPUTATION Left 03/03/2018   Procedure: LEFT BELOW KNEE AMPUTATION;  Surgeon: Nadara Mustard, MD;  Location: [facility] OR;  Service: Orthopedics;  Laterality: Left;   AMPUTATION Right 05/21/2022   Procedure: REVISION AMPUTATION RAY 1ST;  Surgeon: Candelaria Stagers, DPM;  Location: [facility] OR;  Service: Podiatry;  Laterality: Right;   CARPAL TUNNEL RELEASE Bilateral    CESAREAN SECTION  2007   CORONARY ANGIOGRAPHY N/A 11/26/2020   Procedure: CORONARY ANGIOGRAPHY;  Surgeon: Tonny Bollman, MD;  Location: [facility] INVASIVE CV LAB;  Service: Cardiovascular;  Laterality: N/A;   FOOT AMPUTATION Right    INCISION AND DRAINAGE ABSCESS Left 10/16/2021   Procedure: INCISION AND DRAINAGE ABSCESS;  Surgeon: Fritzi Mandes, MD;  Location: [facility] OR;  Service:  General;  Laterality: Left;   INCISION AND DRAINAGE PERIRECTAL ABSCESS Left 05/18/2019   Procedure: IRRIGATION AND DEBRIDEMENT OF PANNIS ABSCESS, POSSIBLE DEBRIDEMENT OF BUTTOCK WOUND;  Surgeon: Manus Rudd, MD;  Location: [facility] OR;  Service: General;  Laterality: Left;   IRRIGATION AND DEBRIDEMENT BUTTOCKS Left 05/17/2019   Procedure: IRRIGATION AND DEBRIDEMENT BUTTOCKS;  Surgeon: Manus Rudd, MD;  Location: [facility] OR;  Service: General;  Laterality: Left;   KNEE ARTHROSCOPY Right 07/17/2010   LEFT HEART CATHETERIZATION WITH CORONARY ANGIOGRAM N/A 07/27/2012   Procedure: LEFT HEART CATHETERIZATION WITH CORONARY ANGIOGRAM;  Surgeon: Tonny Bollman, MD;  Location: [facility] CATH LAB;  Service: Cardiovascular;  Laterality: N/A;   MASS EXCISION N/A 06/29/2013   Procedure:  WIDE LOCAL EXCISION OF POSTERIOR NECK ABSCESS;  Surgeon: Axel Filler, MD;  Location: [facility] OR;  Service: General;  Laterality: N/A;   REPAIR KNEE LIGAMENT Left    "fixed ligaments and chipped patella"   right transmetatarsal amputation  TRACHEOSTOMY TUBE PLACEMENT N/A 07/10/2022   Procedure: CONTROLLED EXTUBATION;  Surgeon: Serena Colonel, MD;  Location: [facility] OR;  Service: ENT;  Laterality: N/A;     A IV Location/Drains/Wounds Patient Lines/Drains/Airways Status     Active Line/Drains/Airways     Name Placement date Placement time Site Days   Peripheral IV 04/26/23 20 G 1" Left;Posterior Hand 04/26/23  0352  Hand  less than 1   Pressure Injury 03/19/23 Heel Right Stage 1 -  Intact skin with non-blanchable redness of a localized area usually over a bony prominence. 03/19/23  1745  -- 38   Wound / Incision (Open or Dehisced) 03/19/23 (ITD) Intertriginous Dermatitis Abdomen Lower;Bilateral 03/19/23  1630  Abdomen  38   Wound / Incision (Open or Dehisced) 03/19/23 Non-pressure wound Axilla Right per patient report: ruptured boil 03/19/23  1630  Axilla  38            Intake/Output Last 24 hours  Intake/Output Summary (Last 24  hours) at 04/26/2023 1610 Last data filed at 04/25/2023 2349 Gross per 24 hour  Intake --  Output 600 ml  Net -600 ml    Labs/Imaging Results for orders placed or performed during the hospital encounter of 04/25/23 (from the past 48 hour(s))  CBC with Differential     Status: Abnormal   Collection Time: 04/25/23  6:56 PM  Result Value Ref Range   WBC 10.3 4.0 - 10.5 K/uL   RBC 4.59 3.87 - 5.11 MIL/uL   Hemoglobin 12.8 12.0 - 15.0 g/dL   HCT 96.0 45.4 - 09.8 %   MCV 89.1 80.0 - 100.0 fL   MCH 27.9 26.0 - 34.0 pg   MCHC 31.3 30.0 - 36.0 g/dL   RDW 11.9 (H) 14.7 - 82.9 %   Platelets 359 150 - 400 K/uL   nRBC 0.0 0.0 - 0.2 %   Neutrophils Relative % 56 %   Neutro Abs 5.9 1.7 - 7.7 K/uL   Lymphocytes Relative 32 %   Lymphs Abs 3.3 0.7 - 4.0 K/uL   Monocytes Relative 8 %   Monocytes Absolute 0.8 0.1 - 1.0 K/uL   Eosinophils Relative 2 %   Eosinophils Absolute 0.2 0.0 - 0.5 K/uL   Basophils Relative 1 %   Basophils Absolute 0.1 0.0 - 0.1 K/uL   Immature Granulocytes 1 %   Abs Immature Granulocytes 0.07 0.00 - 0.07 K/uL    Comment: Performed at [facility], [address] [place], [place] [address]  Basic metabolic panel     Status: Abnormal   Collection Time: 04/25/23  6:56 PM  Result Value Ref Range   Sodium 135 135 - 145 mmol/L   Potassium 3.7 3.5 - 5.1 mmol/L   Chloride 83 (L) 98 - 111 mmol/L   CO2 36 (H) 22 - 32 mmol/L   Glucose, Bld 207 (H) 70 - 99 mg/dL    Comment: Glucose reference range applies only to samples taken after fasting for at least 8 hours.   BUN 129 (H) 6 - 20 mg/dL    Comment: RESULT CONFIRMED BY MANUAL DILUTION   Creatinine, Ser 2.27 (H) 0.44 - 1.00 mg/dL   Calcium 9.4 8.9 - 08.6 mg/dL   GFR, Estimated 26 (L) >60 mL/min    Comment: (NOTE) Calculated using the CKD-EPI Creatinine Equation (2021)    Anion gap 16 (H) 5 - 15    Comment: Performed at [facility], [address], [place], [place] [address]  Brain  natriuretic peptide     Status: None   Collection Time: 04/25/23  6:56 PM  Result Value Ref Range   B Natriuretic Peptide 22.8 0.0 - 100.0 pg/mL    Comment: Performed at [facility], [address] [place], [place] [address]  Troponin I (High Sensitivity)     Status: Abnormal   Collection Time: 04/25/23  6:56 PM  Result Value Ref Range   Troponin I (High Sensitivity) 34 (H) <18 ng/L    Comment: (NOTE) Elevated high sensitivity troponin I (hsTnI) values and significant  changes across serial measurements may suggest ACS but many other  chronic and acute conditions are known to elevate hsTnI results.  Refer to the "Links" section for chest pain algorithms and additional  guidance. Performed at [facility], [address] [place], [place] [address]   CBG monitoring, ED     Status: Abnormal   Collection Time: 04/25/23  9:51 PM  Result Value Ref Range   Glucose-Capillary 278 (H) 70 - 99 mg/dL    Comment: Glucose reference range applies only to samples taken after fasting for at least 8 hours.  Urinalysis, Routine w reflex microscopic -Urine, Catheterized     Status: Abnormal   Collection Time: 04/25/23 11:56 PM  Result Value Ref Range   Color, Urine YELLOW YELLOW   APPearance CLOUDY (A) CLEAR   Specific Gravity, Urine 1.011 1.005 - 1.030   pH 6.0 5.0 - 8.0   Glucose, UA NEGATIVE NEGATIVE mg/dL   Hgb urine dipstick NEGATIVE NEGATIVE   Bilirubin Urine NEGATIVE NEGATIVE   Ketones, ur NEGATIVE NEGATIVE mg/dL   Protein, ur 098 (A) NEGATIVE mg/dL   Nitrite NEGATIVE NEGATIVE   Leukocytes,Ua LARGE (A) NEGATIVE   RBC / HPF 0-5 0 - 5 RBC/hpf   WBC, UA >50 0 - 5 WBC/hpf   Bacteria, UA RARE (A) NONE SEEN   Squamous Epithelial / HPF 0-5 0 - 5 /HPF   WBC Clumps PRESENT    Budding Yeast PRESENT    Non Squamous Epithelial 0-5 (A) NONE SEEN    Comment: Performed at [facility], [address], [place], [place] [address]  Pregnancy,  urine     Status: None   Collection Time: 04/25/23 11:57 PM  Result Value Ref Range   Preg Test, Ur NEGATIVE NEGATIVE    Comment:        THE SENSITIVITY OF THIS METHODOLOGY IS >20 mIU/mL. Performed at [facility], [address] [place], [place] [address]   Rapid urine drug screen (hospital performed)     Status: Abnormal   Collection Time: 04/25/23 11:57 PM  Result Value Ref Range   Opiates NONE DETECTED NONE DETECTED   Cocaine POSITIVE (A) NONE DETECTED   Benzodiazepines NONE DETECTED NONE DETECTED   Amphetamines NONE DETECTED NONE DETECTED   Tetrahydrocannabinol NONE DETECTED NONE DETECTED   Barbiturates NONE DETECTED NONE DETECTED    Comment: (NOTE) DRUG SCREEN FOR MEDICAL PURPOSES ONLY.  IF CONFIRMATION IS NEEDED FOR ANY PURPOSE, NOTIFY LAB WITHIN 5 DAYS.  LOWEST DETECTABLE LIMITS FOR URINE DRUG SCREEN Drug Class                     Cutoff (ng/mL) Amphetamine and metabolites    1000 Barbiturate and metabolites    200 Benzodiazepine                 200 Opiates and metabolites  300 Cocaine and metabolites        300 THC                            50 Performed at [facility], [address] [place], [place] [address]   Troponin I (High Sensitivity)     Status: Abnormal   Collection Time: 04/26/23 12:25 AM  Result Value Ref Range   Troponin I (High Sensitivity) 34 (H) <18 ng/L    Comment: (NOTE) Elevated high sensitivity troponin I (hsTnI) values and significant  changes across serial measurements may suggest ACS but many other  chronic and acute conditions are known to elevate hsTnI results.  Refer to the "Links" section for chest pain algorithms and additional  guidance. Performed at [facility], [address] [place], [place] [address]   CBG monitoring, ED     Status: Abnormal   Collection Time: 04/26/23  4:43 AM  Result Value Ref Range   Glucose-Capillary 288 (H) 70 - 99 mg/dL    Comment:  Glucose reference range applies only to samples taken after fasting for at least 8 hours.   *Note: Due to a large number of results and/or encounters for the requested time period, some results have not been displayed. A complete set of results can be found in Results Review.   DG Chest 2 View  Result Date: 04/25/2023 CLINICAL DATA:  Shortness of breath. EXAM: CHEST - 2 VIEW COMPARISON:  04/12/2023. FINDINGS: The heart size and mediastinal contours are within normal limits. Lung volumes are low and pulmonary vascular crowding is noted. No consolidation, effusion, or pneumothorax. Degenerative changes are present in the thoracic spine. No acute osseous abnormality. IMPRESSION: Low lung volumes with no acute process. Electronically Signed   By: Thornell Sartorius M.D.   On: 04/25/2023 22:57    Pending Labs Unresulted Labs (From admission, onward)     Start     Ordered   04/26/23 0309  Culture, blood (Routine X 2) w Reflex to ID Panel  BLOOD CULTURE X 2,   R (with STAT occurrences)      04/26/23 0308            Vitals/Pain Today's Vitals   04/26/23 0125 04/26/23 0304 04/26/23 0330 04/26/23 0636  BP:  103/89  125/61  Pulse:  96  96  Resp:  17  16  Temp:   98.5 F (36.9 C) 97.7 F (36.5 C)  TempSrc:   Oral Oral  SpO2:  94%  94%  Weight:      Height:      PainSc: 7        Isolation Precautions No active isolations  Medications Medications  insulin aspart (novoLOG) injection 0-15 Units (has no administration in time range)  insulin aspart (novoLOG) injection 0-5 Units (has no administration in time range)  metolazone (ZAROXOLYN) tablet 5 mg (has no administration in time range)  torsemide (DEMADEX) tablet 100 mg (has no administration in time range)  0.9 %  sodium chloride infusion ( Intravenous New Bag/Given 04/26/23 0312)  insulin glargine-yfgn (SEMGLEE) injection 70 Units (70 Units Subcutaneous Given 04/26/23 0443)  ciprofloxacin (CIPRO) IVPB 400 mg (has no administration in time  range)  ondansetron (ZOFRAN-ODT) disintegrating tablet 4 mg (4 mg Oral Given 04/25/23 1841)  ondansetron (ZOFRAN-ODT) disintegrating tablet 4 mg (4 mg Oral Given 04/26/23 0021)  acetaminophen (TYLENOL) tablet 650 mg (650 mg Oral Given 04/26/23 0021)  ciprofloxacin (  CIPRO) IVPB 400 mg (0 mg Intravenous Stopped 04/26/23 0429)    Mobility walks     Focused Assessments Renal Assessment Handoff:     R Recommendations: See Admitting Provider Note  Report given to:   Additional Notes: Pt comes in from a motel.

## 2023-04-26 NOTE — Progress Notes (Signed)
   04/26/23 2120  BiPAP/CPAP/SIPAP  BiPAP/CPAP/SIPAP Pt Type Adult  BiPAP/CPAP/SIPAP DREAMSTATIOND  Mask Type Full face mask  Mask Size Medium  Flow Rate 3 lpm  Patient Home Equipment No  Auto Titrate Yes (Imax 20 Emin 8 PS 4-8)  CPAP/SIPAP surface wiped down Yes  BiPAP/CPAP /SiPAP Vitals  Pulse Rate 93  Resp 18  SpO2 95 %  Bilateral Breath Sounds Clear;Diminished  MEWS Score/Color  MEWS Score 0  MEWS Score Color Green   Low SpO2 reported by RN. Patient conveys she uses BiPAP at home. Order changed accordingly and BiPAP settings placed. SpO2 post changes 95%. RN made aware.

## 2023-04-26 NOTE — Progress Notes (Signed)
   04/26/23 1115  BiPAP/CPAP/SIPAP  $ Non-Invasive Ventilator  Non-Invasive Vent Initial;Non-Invasive Vent Set Up  BiPAP/CPAP/SIPAP Pt Type Adult (Auto: 4-20)  BiPAP/CPAP/SIPAP DREAMSTATIOND  Mask Type Full face mask  Mask Size Medium  FiO2 (%) (S)  32 %  Flow Rate (S)  3 lpm (3 L home 02 bleed in.)  Auto Titrate (S)  Yes (4-20, pt is unsure of her BIPAP home settings.)  Nasal massage performed No (comment)  CPAP/SIPAP surface wiped down Yes  BiPAP/CPAP /SiPAP Vitals  Pulse Rate 93  Resp 18  SpO2 95 %  Bilateral Breath Sounds Clear;Diminished  MEWS Score/Color  MEWS Score 0  MEWS Score Color Green

## 2023-04-27 ENCOUNTER — Inpatient Hospital Stay (HOSPITAL_COMMUNITY): Payer: 59

## 2023-04-27 DIAGNOSIS — N390 Urinary tract infection, site not specified: Secondary | ICD-10-CM | POA: Diagnosis not present

## 2023-04-27 LAB — COMPREHENSIVE METABOLIC PANEL
ALT: 26 U/L (ref 0–44)
AST: 20 U/L (ref 15–41)
Albumin: 3.5 g/dL (ref 3.5–5.0)
Alkaline Phosphatase: 118 U/L (ref 38–126)
Anion gap: 14 (ref 5–15)
BUN: 133 mg/dL — ABNORMAL HIGH (ref 6–20)
CO2: 38 mmol/L — ABNORMAL HIGH (ref 22–32)
Calcium: 9.7 mg/dL (ref 8.9–10.3)
Chloride: 83 mmol/L — ABNORMAL LOW (ref 98–111)
Creatinine, Ser: 2.43 mg/dL — ABNORMAL HIGH (ref 0.44–1.00)
GFR, Estimated: 24 mL/min — ABNORMAL LOW (ref >60)
Glucose, Bld: 303 mg/dL — ABNORMAL HIGH (ref 70–99)
Potassium: 3.2 mmol/L — ABNORMAL LOW (ref 3.5–5.1)
Sodium: 135 mmol/L (ref 135–145)
Total Bilirubin: 0.3 mg/dL (ref 0.3–1.2)
Total Protein: 8.8 g/dL — ABNORMAL HIGH (ref 6.5–8.1)

## 2023-04-27 LAB — GLUCOSE, CAPILLARY
Glucose-Capillary: 312 mg/dL — ABNORMAL HIGH (ref 70–99)
Glucose-Capillary: 295 mg/dL — ABNORMAL HIGH (ref 70–99)
Glucose-Capillary: 311 mg/dL — ABNORMAL HIGH (ref 70–99)
Glucose-Capillary: 238 mg/dL — ABNORMAL HIGH (ref 70–99)
Glucose-Capillary: 382 mg/dL — ABNORMAL HIGH (ref 70–99)

## 2023-04-27 LAB — CBC
HCT: 38.9 % (ref 36.0–46.0)
Hemoglobin: 12.3 g/dL (ref 12.0–15.0)
MCH: 28.3 pg (ref 26.0–34.0)
MCHC: 31.6 g/dL (ref 30.0–36.0)
MCV: 89.6 fL (ref 80.0–100.0)
Platelets: 303 10*3/uL (ref 150–400)
RBC: 4.34 MIL/uL (ref 3.87–5.11)
RDW: 15.7 % — ABNORMAL HIGH (ref 11.5–15.5)
WBC: 8.3 10*3/uL (ref 4.0–10.5)
nRBC: 0 % (ref 0.0–0.2)

## 2023-04-27 LAB — CULTURE, BLOOD (ROUTINE X 2): Culture: NO GROWTH

## 2023-04-27 MED ORDER — POTASSIUM CHLORIDE 10 MEQ/100ML IV SOLN
10.0000 meq | INTRAVENOUS | Status: AC
  Administered 2023-04-27: 10 meq via INTRAVENOUS
  Filled 2023-04-27: qty 100

## 2023-04-27 MED ORDER — GABAPENTIN 300 MG PO CAPS
300.0000 mg | ORAL_CAPSULE | Freq: Every day | ORAL | Status: DC
  Administered 2023-04-27 – 2023-04-28 (×2): 300 mg via ORAL
  Filled 2023-04-27 (×2): qty 1

## 2023-04-27 MED ORDER — POTASSIUM CHLORIDE CRYS ER 20 MEQ PO TBCR
20.0000 meq | EXTENDED_RELEASE_TABLET | Freq: Two times a day (BID) | ORAL | Status: DC
  Filled 2023-04-27: qty 1

## 2023-04-27 MED ORDER — PROCHLORPERAZINE EDISYLATE 10 MG/2ML IJ SOLN: 10.0000 mg | Freq: Four times a day (QID) | INTRAMUSCULAR | Status: DC | PRN

## 2023-04-27 MED ORDER — INSULIN ASPART 100 UNIT/ML IJ SOLN
6.0000 [IU] | Freq: Three times a day (TID) | INTRAMUSCULAR | Status: DC
  Administered 2023-04-27 – 2023-04-29 (×5): 6 [IU] via SUBCUTANEOUS

## 2023-04-27 MED ORDER — METOCLOPRAMIDE HCL 5 MG/ML IJ SOLN
5.0000 mg | Freq: Two times a day (BID) | INTRAMUSCULAR | Status: DC | PRN
  Administered 2023-04-27: 5 mg via INTRAVENOUS
  Filled 2023-04-27 (×2): qty 2

## 2023-04-27 MED ORDER — TORSEMIDE 100 MG PO TABS
100.0000 mg | ORAL_TABLET | Freq: Every day | ORAL | Status: DC
  Administered 2023-04-28 – 2023-04-29 (×2): 100 mg via ORAL
  Filled 2023-04-27 (×2): qty 1

## 2023-04-27 MED ORDER — HYDROMORPHONE HCL 1 MG/ML IJ SOLN
0.5000 mg | Freq: Once | INTRAMUSCULAR | Status: AC
  Administered 2023-04-27: 0.5 mg via INTRAVENOUS
  Filled 2023-04-27: qty 0.5

## 2023-04-27 MED ORDER — POTASSIUM CHLORIDE 10 MEQ/100ML IV SOLN
10.0000 meq | Freq: Once | INTRAVENOUS | Status: AC
  Administered 2023-04-27: 10 meq via INTRAVENOUS

## 2023-04-27 NOTE — TOC Initial Note (Signed)
Transition of Care [facility]) - Initial/Assessment Note    Patient Details  Name: [name] MRN: [id number] Date of Birth: 12-06-1976  [facility] CM/SW Contact:    Larrie Kass, LCSW Phone Number: 04/27/2023, 10:04 AM  Clinical Narrative:                 Per chart reviewed , pt recently d/c on 5/9. Pt from hotel with daughter, has CAP Aide services through [facility]. Pt has no DME needs at this time. Pt may need EMS transport at time of d/c. TOC to follow for d/c needs.   Expected Discharge Plan:  (TBD) Barriers to Discharge: Continued Medical Work up   Patient Goals and CMS Choice            Expected Discharge Plan and Services       Living arrangements for the past 2 months: Hotel/Motel                                      Prior Living Arrangements/Services Living arrangements for the past 2 months: Hotel/Motel Lives with:: Self, Adult Children Patient language and need for interpreter reviewed:: Yes        Need for Family Participation in Patient Care: No (Comment) Care giver support system in place?: Yes (comment)   Criminal Activity/Legal Involvement Pertinent to Current Situation/Hospitalization: No - Comment as needed  Activities of Daily Living Home Assistive Devices/Equipment: Wheelchair ADL Screening (condition at time of admission) Patient's cognitive ability adequate to safely complete daily activities?: Yes Is the patient deaf or have difficulty hearing?: No Does the patient have difficulty seeing, even when wearing glasses/contacts?: Yes Does the patient have difficulty concentrating, remembering, or making decisions?: No Patient able to express need for assistance with ADLs?: Yes Does the patient have difficulty dressing or bathing?: Yes Independently performs ADLs?: No Does the patient have difficulty walking or climbing stairs?: Yes Weakness of Legs: Both Weakness of Arms/Hands: None  Permission Sought/Granted                   Emotional Assessment Appearance:: Appears stated age            Admission diagnosis:  Twitching [R25.3] Uremia [N19] Acute cystitis without hematuria [N30.00] Acute UTI (urinary tract infection) [N39.0] Patient Active Problem List   Diagnosis Date Noted   Acute UTI (urinary tract infection) 04/26/2023   Uremia 04/12/2023   Urinary retention 04/12/2023   AKI (acute kidney injury) ([name]) 03/19/2023   Intertrigo 03/19/2023   Osteoarthritis of right knee 03/19/2023   Acute congestive heart failure ([name]) 02/22/2023   Acute on chronic respiratory failure with hypercapnia ([name]) 02/21/2023   Shortness of breath 02/20/2023   Acute exacerbation of CHF (congestive heart failure) ([name]) 11/28/2022   Cocaine use 11/05/2022   Cellulitis of right lower extremity 06/10/2022   Hypervolemia 06/01/2022   Peripheral artery disease, right leg, mild([name]) 05/15/2022   Osteomyelitis of foot, right, acute ([name]) 05/14/2022   Wound infection 04/09/2022   Adrenal nodule ([name]) 01/07/2022   Iritis    (HFpEF) heart failure with preserved ejection fraction ([name]) 10/19/2021   Impaired mobility 07/07/2021   Chest pain 05/11/2021   Hyperlipidemia associated with type 2 diabetes mellitus ([name]) 01/15/2021   Hypertension associated with type 2 diabetes mellitus ([name]) 01/15/2021   Nonobstructive atherosclerosis of coronary artery 11/26/2020   Urinary incontinence 10/23/2020   Chronic respiratory failure with  hypoxia ([name]) 06/14/2020   Constipation 04/03/2020   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[name]) 04/11/2019   Type 2 diabetes mellitus with hyperglycemia ([name]) 04/11/2019   S/P transmetatarsal amputation of foot, right ([name]) 01/25/2019   Hypokalemia    Allergic rhinitis 03/06/2018   Class 3 severe obesity due to excess calories with serious comorbidity and body mass index (BMI) of 50.0 to 59.9 in adult [facility])    Gout of knee, Right 10/22/2016   Vitamin D deficiency 09/05/2015   Recurrent candidiasis  of vagina 09/05/2015   Varicose veins of leg with complications 06/11/2015   Restless leg syndrome 10/17/2014   Chronic sinusitis 07/18/2014   Insomnia 08/14/2013   Encounter for chronic pain management 09/01/2011   Obesity hypoventilation syndrome ([name]) 05/22/2008   Mood disorder ([name]) 05/22/2008   Obstructive sleep apnea (adult) (pediatric) 05/22/2008   Hypertension 05/22/2008   Asthma, chronic 05/22/2008   GERD 05/22/2008   Type 2 diabetes mellitus with diabetic neuropathy, unspecified ([name]) 05/22/2008   PCP:  Latrelle Dodrill, MD Pharmacy:   [facility] [place], [place] - [address] [place] [address] Phone: [phone] Fax: [phone]  [name] [address] [place] [address] Phone: [phone] Fax: [phone]     Social Determinants of Health (SDOH) Social History: SDOH Screenings   Food Insecurity: No Food Insecurity (04/27/2023)  Housing: Medium Risk (04/27/2023)  Transportation Needs: No Transportation Needs (04/27/2023)  Utilities: Not At Risk (04/27/2023)  Alcohol Screen: Low Risk  (04/14/2023)  Depression (PHQ2-9): Medium Risk (03/07/2023)  Financial Resource Strain: High Risk (04/15/2023)  Stress: Stress Concern Present (02/19/2021)  Tobacco Use: Medium Risk (04/26/2023)   SDOH Interventions:     Readmission Risk Interventions    03/22/2023   10:47 AM 05/24/2022   11:33 AM  Readmission Risk Prevention Plan  Transportation Screening Complete Complete  Medication Review (RN Care Manager) Complete Complete  PCP or Specialist appointment within 3-5 days of discharge Complete Complete  HRI or Home Care Consult Complete Complete  SW Recovery Care/Counseling Consult Complete Complete  Palliative Care Screening Not Applicable Not Applicable  Skilled Nursing Facility Not Applicable Patient Refused

## 2023-04-27 NOTE — Progress Notes (Signed)
   04/27/23 2015  BiPAP/CPAP/SIPAP  BiPAP/CPAP/SIPAP Pt Type Adult  BiPAP/CPAP/SIPAP DREAMSTATIOND  Mask Type Full face mask  Mask Size Medium  Flow Rate 3 lpm  Patient Home Equipment No  Auto Titrate  (Iamx 20 Emin 8 PS 4-8)  CPAP/SIPAP surface wiped down Yes  BiPAP/CPAP /SiPAP Vitals  Pulse Rate (!) 106  Resp 16  SpO2 94 %  Bilateral Breath Sounds Clear;Diminished  MEWS Score/Color  MEWS Score 1  MEWS Score Color Green

## 2023-04-27 NOTE — Plan of Care (Signed)
  Problem: Education: Goal: Ability to describe self-care measures that may prevent or decrease complications (Diabetes Survival Skills Education) will improve Outcome: Progressing   Problem: Nutritional: Goal: Maintenance of adequate nutrition will improve Outcome: Progressing Goal: Progress toward achieving an optimal weight will improve Outcome: Progressing   Problem: Tissue Perfusion: Goal: Adequacy of tissue perfusion will improve Outcome: Progressing   Problem: Clinical Measurements: Goal: Will remain free from infection Outcome: Progressing Goal: Cardiovascular complication will be avoided Outcome: Progressing

## 2023-04-27 NOTE — Consult Note (Signed)
Reason for Consult: Acute kidney injury on chronic kidney disease stage IIIb Referring Physician: Pamella Pert MD [facility])  HPI:  47 year old woman with past medical history significant for insulin-dependent type 2 diabetes mellitus, chronic diastolic heart failure, chronic hypoxic respiratory failure on home oxygen (3 L/min), nonobstructive coronary artery disease, history of left foot osteomyelitis status post BKA, morbid obesity, OHS/OSA on nocturnal BiPAP, bipolar disorder, peripheral neuropathy, cocaine use and underlying chronic kidney disease stage IIIb (creatinine 1.6-1.8) following stepwise progression from episodes of acute kidney injury.  She sees Dr. Arrie Aran at [facility] for her chronic kidney disease.  She was admitted to the hospital yesterday with worsening tremors, cloudy urine and urinary frequency without dysuria or hematuria.  Lab evaluation indicated possible urinary tract infection and drug screen was positive for cocaine.  She also had azotemia with BUN of 129 (up from 124 last week) and elevated creatinine of 2.3 that is up to 2.4 today.  Other significant labs include mild hypokalemia of 3.2 with evidence of metabolic alkalosis (bicarbonate 38).  She reports nausea for the past 3 weeks and apparently is being worked up for gastroparesis.  She denies any vomiting, diarrhea or abdominal pain and has not had any hematochezia or melena.  She denies any cough, sputum production, fever or chills.  Past Medical History:  Diagnosis Date   Abscess of groin, left    Acute on chronic diastolic (congestive) heart failure ([name]) 02/05/2022   Acute on chronic respiratory failure with hypoxia ([name]) 04/11/2022   Acute osteomyelitis of ankle or foot, left ([name]) 01/31/2018   Acute respiratory failure with hypoxia and hypercarbia ([name]) 06/15/2020   Acute tubular necrosis ([name]) 07/12/2022   Alveolar hypoventilation    Amputation stump infection ([name]) 04/09/2018   Anemia     not on iron pill   Asthma    Bipolar 2 disorder ([name])    Candidiasis of vagina 05/15/2022   Carpal tunnel syndrome on right    recurrent   Cellulitis 08/2010-08/2011   Chest pain with low risk for cardiac etiology 05/11/2021   Chronic pain    COPD (chronic obstructive pulmonary disease) ([name])    Symbicort daily and Proventil as needed   Costochondritis    De Quervain's tenosynovitis, bilateral 11/01/2015   Depression    Diabetes mellitus type II, uncontrolled 2000   Type 2, Uncontrolled.Takes Lantus daily.Fasting blood sugar runs 150   Diabetic ulcer of right foot ([name]) 12/19/2021   Drug-seeking behavior    Elevated troponin    Exposure to mold 04/21/2020   Family history of metabolic acidosis with increased anion gap 04/12/2023   GERD (gastroesophageal reflux disease)    takes Pantoprazole and Zantac daily   HLD (hyperlipidemia)    takes Atorvastatin daily   Hypertension    takes Lisinopril and Coreg daily   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  Mild diffuse proximal LAD stenosis with no evidence of obstructive disease (20 to 30% diffuse stenosis);  2.  Left dominant circumflex with widely patent obtuse marginal branches and left PDA branch, no significant stenoses present  3.  Nondominant RCA with mild diffuse nonobstructive stenosis    OSA on CPAP    Peripheral neuropathy    takes Gabapentin daily  Pneumonia    "walking" several yrs ago and as a baby (12/05/2018)   Pneumonia due to COVID-19 virus 04/14/2021   Primary osteoarthritis of first carpometacarpal joint of left hand 07/30/2016   Rectal fissure    Restless leg    Right carpal tunnel syndrome 09/01/2011   SVT (supraventricular tachycardia)    Syncope  02/25/2016   Thrombocytosis 04/11/2022   Urinary frequency    Urinary incontinence 10/23/2020   UTI (urinary tract infection) 04/15/2021   Varicose veins    Right medial thigh and Left leg     Past Surgical History:  Procedure Laterality Date   AMPUTATION Left 02/01/2018   Procedure: LEFT FOURTH AND 5TH TOE RAY AMPUTATION;  Surgeon: Nadara Mustard, MD;  Location: [facility] OR;  Service: Orthopedics;  Laterality: Left;   AMPUTATION Left 03/03/2018   Procedure: LEFT BELOW KNEE AMPUTATION;  Surgeon: Nadara Mustard, MD;  Location: [facility] OR;  Service: Orthopedics;  Laterality: Left;   AMPUTATION Right 05/21/2022   Procedure: REVISION AMPUTATION RAY 1ST;  Surgeon: Candelaria Stagers, DPM;  Location: [facility] OR;  Service: Podiatry;  Laterality: Right;   CARPAL TUNNEL RELEASE Bilateral    CESAREAN SECTION  2007   CORONARY ANGIOGRAPHY N/A 11/26/2020   Procedure: CORONARY ANGIOGRAPHY;  Surgeon: Tonny Bollman, MD;  Location: [facility] INVASIVE CV LAB;  Service: Cardiovascular;  Laterality: N/A;   FOOT AMPUTATION Right    INCISION AND DRAINAGE ABSCESS Left 10/16/2021   Procedure: INCISION AND DRAINAGE ABSCESS;  Surgeon: Fritzi Mandes, MD;  Location: [facility] OR;  Service: General;  Laterality: Left;   INCISION AND DRAINAGE PERIRECTAL ABSCESS Left 05/18/2019   Procedure: IRRIGATION AND DEBRIDEMENT OF PANNIS ABSCESS, POSSIBLE DEBRIDEMENT OF BUTTOCK WOUND;  Surgeon: Manus Rudd, MD;  Location: [facility] OR;  Service: General;  Laterality: Left;   IRRIGATION AND DEBRIDEMENT BUTTOCKS Left 05/17/2019   Procedure: IRRIGATION AND DEBRIDEMENT BUTTOCKS;  Surgeon: Manus Rudd, MD;  Location: [facility] OR;  Service: General;  Laterality: Left;   KNEE ARTHROSCOPY Right 07/17/2010   LEFT HEART CATHETERIZATION WITH CORONARY ANGIOGRAM N/A 07/27/2012   Procedure: LEFT HEART CATHETERIZATION WITH CORONARY ANGIOGRAM;  Surgeon: Tonny Bollman, MD;  Location: [facility] CATH LAB;  Service: Cardiovascular;  Laterality: N/A;   MASS EXCISION N/A 06/29/2013    Procedure:  WIDE LOCAL EXCISION OF POSTERIOR NECK ABSCESS;  Surgeon: Axel Filler, MD;  Location: [facility] OR;  Service: General;  Laterality: N/A;   REPAIR KNEE LIGAMENT Left    "fixed ligaments and chipped patella"   right transmetatarsal amputation      TRACHEOSTOMY TUBE PLACEMENT N/A 07/10/2022   Procedure: CONTROLLED EXTUBATION;  Surgeon: Serena Colonel, MD;  Location: [facility] OR;  Service: ENT;  Laterality: N/A;    Family History  Problem Relation Age of Onset   Diabetes Mother    Hyperlipidemia Mother    Depression Mother    GER disease Mother    Allergic rhinitis Mother    Restless legs syndrome Mother    Heart attack Paternal Uncle    Heart disease Paternal Grandmother    Heart attack Paternal Grandmother    Heart attack Paternal Grandfather    Heart disease Paternal Grandfather    Heart attack Father    Migraines Sister    Cancer Maternal Grandmother        COLON   Hypertension Maternal Grandmother    Hyperlipidemia Maternal Grandmother    Diabetes Maternal Grandmother    Other Maternal Grandfather        GUN SHOT  Anxiety disorder Sister    Asthma Child     Social History:  reports that she quit smoking about 17 years ago. Her smoking use included cigarettes. She has a 3.75 pack-year smoking history. She has never used smokeless tobacco. She reports that she does not currently use alcohol. She reports current drug use. Drug: "Crack" cocaine.  Allergies:  Allergies  Allergen Reactions   Cefepime Other (See Comments)    AKI, see records from [facility] hospitalization in January 2020.   Other reaction(s): Other (See Comments) "Shut down organs/kidneys"  Note pt has tolerated Rocephin and Keflex Other reaction(s): Not available   Iodine Other (See Comments)    Kidney dysfunction   Kiwi Extract Shortness Of Breath, Swelling and Anaphylaxis    Other reaction(s): Not available   Morphine And Codeine Nausea And Vomiting   Pentoxifylline Nausea And Vomiting    Trental    Cephalosporins Other (See Comments)    Pt states that they cause her kidneys to shut down   Toradol [Ketorolac Tromethamine] Other (See Comments)    Feels like something is crawling on me   Nubain [Nalbuphine Hcl] Other (See Comments)    "FEELS LIKE SOMETHING CRAWLING ON ME" Hallucinations     Medications: I have reviewed the patient's current medications. Scheduled:  amLODipine  10 mg Oral Daily   aspirin EC  81 mg Oral Daily   Brinzolamide-Brimonidine  1 drop Both Eyes TID   cholecalciferol  1,000 Units Oral Daily   diclofenac Sodium  2 g Topical QID   DULoxetine  120 mg Oral Daily   gabapentin  300 mg Oral QHS   heparin  5,000 Units Subcutaneous Q8H   insulin aspart  0-15 Units Subcutaneous TID WC   insulin aspart  0-5 Units Subcutaneous QHS   insulin glargine-yfgn  70 Units Subcutaneous BID   latanoprost  1 drop Both Eyes QHS   lidocaine  1 patch Transdermal Q24H   Netarsudil Dimesylate  1 drop Left Eye QHS   pantoprazole  40 mg Oral Daily   prednisoLONE acetate  1 drop Both Eyes Q6H   rOPINIRole  1 mg Oral QHS   rosuvastatin  10 mg Oral Daily   [START ON 04/28/2023] torsemide  100 mg Oral Daily   Continuous:  cefTRIAXone (ROCEPHIN)  IV 2 g (04/26/23 1700)       Latest Ref Rng & Units 04/27/2023    4:39 AM 04/25/2023    6:56 PM 04/21/2023    3:30 PM  BMP  Glucose 70 - 99 mg/dL 161  096  045   BUN 6 - 20 mg/dL 409  811  914   Creatinine 0.44 - 1.00 mg/dL 7.82  9.56  2.13   BUN/Creat Ratio 9 - 23   63   Sodium 135 - 145 mmol/L 135  135  135   Potassium 3.5 - 5.1 mmol/L 3.2  3.7  4.2   Chloride 98 - 111 mmol/L 83  83  80   CO2 22 - 32 mmol/L 38  36  35   Calcium 8.9 - 10.3 mg/dL 9.7  9.4  9.7       Latest Ref Rng & Units 04/27/2023    4:39 AM 04/25/2023    6:56 PM 04/14/2023    8:10 AM  CBC  WBC 4.0 - 10.5 K/uL 8.3  10.3  9.0   Hemoglobin 12.0 - 15.0 g/dL 08.6  57.8  46.9   Hematocrit 36.0 - 46.0 % 38.9  40.9  37.5   Platelets 150 - 400 K/uL 303  359  250     Urinalysis    Component Value Date/Time   COLORURINE YELLOW 04/25/2023 2356   APPEARANCEUR CLOUDY (A) 04/25/2023 2356   LABSPEC 1.011 04/25/2023 2356   PHURINE 6.0 04/25/2023 2356   GLUCOSEU NEGATIVE 04/25/2023 2356   HGBUR NEGATIVE 04/25/2023 2356   BILIRUBINUR NEGATIVE 04/25/2023 2356   BILIRUBINUR negative 04/11/2023 1105   KETONESUR NEGATIVE 04/25/2023 2356   PROTEINUR 100 (A) 04/25/2023 2356   UROBILINOGEN 0.2 04/11/2023 1105   UROBILINOGEN 0.2 08/22/2015 0225   NITRITE NEGATIVE 04/25/2023 2356   LEUKOCYTESUR LARGE (A) 04/25/2023 2356   DG Chest 2 View  Result Date: 04/25/2023 CLINICAL DATA:  Shortness of breath. EXAM: CHEST - 2 VIEW COMPARISON:  04/12/2023. FINDINGS: The heart size and mediastinal contours are within normal limits. Lung volumes are low and pulmonary vascular crowding is noted. No consolidation, effusion, or pneumothorax. Degenerative changes are present in the thoracic spine. No acute osseous abnormality. IMPRESSION: Low lung volumes with no acute process. Electronically Signed   By: Thornell Sartorius M.D.   On: 04/25/2023 22:57    Review of Systems  Constitutional:  Positive for appetite change and fatigue. Negative for chills and fever.  HENT:  Negative for congestion, nosebleeds, sinus pressure, sore throat and trouble swallowing.   Eyes:  Negative for photophobia and visual disturbance.  Respiratory:  Positive for shortness of breath. Negative for cough and wheezing.        Chronic shortness of breath  Cardiovascular:  Negative for chest pain and leg swelling.  Gastrointestinal:  Positive for nausea. Negative for abdominal pain, blood in stool and vomiting.  Endocrine: Negative for polydipsia and polyuria.  Genitourinary:  Negative for dysuria, hematuria and urgency.  Musculoskeletal:  Negative for gait problem and myalgias.  Skin:  Negative for rash and wound.  Neurological:  Positive for tremors and weakness. Negative for dizziness and headaches.    Blood pressure 136/68, pulse 93, temperature 97.6 F (36.4 C), temperature source Oral, resp. rate 18, height 5\' 2"  (1.575 m), weight (!) 156 kg, SpO2 98 %. Physical Exam Vitals and nursing note reviewed.  Constitutional:      Appearance: Normal appearance. She is obese. She is ill-appearing.     Comments: Morbidly obese woman with oxygen via nasal cannula sitting up on the edge of her bed  HENT:     Head: Normocephalic and atraumatic.     Right Ear: External ear normal.     Left Ear: External ear normal.     Nose: Nose normal.     Mouth/Throat:     Mouth: Mucous membranes are dry.     Pharynx: Oropharynx is clear.  Eyes:     General: No scleral icterus.    Extraocular Movements: Extraocular movements intact.     Comments: Bilaterally injected conjunctiva  Cardiovascular:     Rate and Rhythm: Normal rate and regular rhythm.     Pulses: Normal pulses.     Heart sounds: Normal heart sounds.  Pulmonary:     Effort: Pulmonary effort is normal.     Breath sounds: Normal breath sounds. No wheezing or rales.  Abdominal:     General: Bowel sounds are normal. There is distension.     Palpations: Abdomen is soft.     Tenderness: There is no abdominal tenderness.     Comments: Obese, nontender  Musculoskeletal:     Cervical back: Normal range of motion and neck supple.  Right lower leg: Edema present.     Comments: 1+ right lower extremity edema.  Left status post BKA  Skin:    General: Skin is warm and dry.     Findings: No erythema.  Neurological:     Mental Status: She is alert and oriented to person, place, and time. Mental status is at baseline.     Assessment/Plan: 1.  Acute kidney injury on chronic kidney disease stage IIIb: With significant azotemia that indeed may be contributing to her tremors and nausea.  I suspect that she has significant intravascular volume contraction/decreased effective arterial blood volume in the setting of ongoing diuresis for diastolic  heart failure.  Given the possibility of gabapentin toxicity in the setting of acute kidney injury, I will decrease her dose from 300 mg 3 times daily to 300 mg nightly.  Will hold her metolazone and decrease torsemide from 100 mg twice daily to 100 mg daily.  We had a brief discussion regarding the importance of cocaine cessation as it remains a risk factor for uncontrolled hypertension and worsening cardiac/renal functions.  Chest x-ray from 04/25/2023 reviewed and found negative for pulmonary edema. Avoid nephrotoxic medications including NSAIDs and iodinated intravenous contrast exposure unless the latter is absolutely indicated.  Preferred narcotic agents for pain control are hydromorphone, fentanyl, and methadone. Morphine should not be used. Avoid Baclofen and avoid oral sodium phosphate and magnesium citrate based laxatives / bowel preps. Continue strict Input and Output monitoring. Will monitor the patient closely with you and intervene or adjust therapy as indicated by changes in clinical status/labs. 2.  Tremors: She apparently has a mild underlying/baseline chronic tremor that was exacerbated over the last few weeks; suspect that this might be related to gabapentin toxicity in the setting of worsening renal function as well as her worsening azotemia.  We have decreased gabapentin dose and made adjustments to diuretics to try and improve renal function. 3.  Urinary tract infection: Suspected in the setting of pyuria seen on initial urinalysis and cultures pending.  On empiric antimicrobial therapy with ceftriaxone. 4.  Chronic hypoxic respiratory failure: Continue oxygen supplementation per her outpatient prescription with nocturnal BiPAP for OHS/OSA. 5.  Hypertension: Blood pressure currently acceptable; indeed may have a component of challenges with management given her habitus.  Diuretics decreased. 6.  Hypokalemia: Mild and secondary to ongoing diuretic use/limited intake.  Will discontinue renal  diet and give potassium supplements.   Bernadene Garside K. 04/27/2023, 12:47 PM

## 2023-04-27 NOTE — Inpatient Diabetes Management (Signed)
Inpatient Diabetes Program Recommendations  AACE/ADA: New Consensus Statement on Inpatient Glycemic Control (2015)  Target Ranges:  Prepandial:   less than 140 mg/dL      Peak postprandial:   less than 180 mg/dL (1-2 hours)      Critically ill patients:  140 - 180 mg/dL   Lab Results  Component Value Date   GLUCAP 295 (H) 04/27/2023   HGBA1C 13.6 (H) 04/13/2023    Review of Glycemic Control  Diabetes history: DM2 Outpatient Diabetes medications: Tresiba 70 units BID, Novolog 45 TID Current orders for Inpatient glycemic control: Semglee 70 units BID, Novolog 0-15 units TID with meals and 0-5 HS  HgbA1C - 13.6%  Inpatient Diabetes Program Recommendations:    Add Novolog 8 units TID with meals if eating > 50%  Spoke with pt at bedside yesterday late afternoon regarding her HgbA1C of 13.6% and goal of 7%. Pt states she wants to lose weight and get blood sugars under control in order to have bariatric surgery. Lives in [place] and is limited with cooking and storing food. States she drink regular soda sometimes and eats "things I know I shouldn't eat." Very little exercise. Monitors blood sugars with Dexcom. Has spoken with Diabetes Coordinators approx 2 weeks ago when inpatient. Sees Endo at [facility]. Previously had insulin pump. Encouraged pt to make sure she goes to all Endo appts. Answered all questions.   Continue to follow.  Thank you. Ailene Ards, RD, LDN, CDCES Inpatient Diabetes Coordinator [phone]

## 2023-04-27 NOTE — Progress Notes (Signed)
PROGRESS NOTE  [name] VHQ:[id number] DOB: 06-22-1976 DOA: 04/25/2023 PCP: Latrelle Dodrill, MD   LOS: 1 day   Brief Narrative / Interim history: 47 year old female with morbid obesity, OSA on CPAP, obesity hypoventilation, asthma/COPD, cocaine use, CKD 4 with baseline creatinine around 1.7 followed by nephrology as an outpatient who comes into the hospital with worsening tremors and significant nausea over the last 2 weeks.  Subjective / 24h Interval events: Complains of significant nausea, actively vomiting when I was in the room.  Complains of worsening tremors especially on the right hand.  Complains of worsening headache on a daily basis over the last 2 weeks  Assesement and Plan: Principal Problem:   Acute UTI (urinary tract infection) Active Problems:   Type 2 diabetes mellitus with hyperglycemia ([name])   Obstructive sleep apnea (adult) (pediatric)   Obesity hypoventilation syndrome ([name])   Hypertension   GERD   Restless leg syndrome   Hyperlipidemia associated with type 2 diabetes mellitus ([name])  Principal problem AKI on CKD 4 -patient's creatinine 2.2 on admission, up to 2.4 this morning.  BUN 133.  Given worsening nausea over the last 2 weeks, there is some concern about her being uremic, nephrology consulted.  Discussed with Dr. Allena Katz. -Continue renal diet, she is unhappy about that  Active problems Possible UTI -on ceftriaxone  Increased tremors -decrease gabapentin dose given worsening of her renal function.  No prior brain imaging, worthwhile to get an MRI even for baseline  OHS, OSA -continue BiPAP, has chronic BiPAP at home  Essential hypertension -continue amlodipine, hold torsemide until evaluated by nephrology  DM2, poorly controlled, with hyperglycemia -continue glargine, sliding scale  Hyperlipidemia-continue statin  Obesity, morbid-BMI 62.  She would benefit from weight loss  Scheduled Meds:  amLODipine  10 mg Oral Daily   aspirin EC  81 mg  Oral Daily   Brinzolamide-Brimonidine  1 drop Both Eyes TID   cholecalciferol  1,000 Units Oral Daily   diclofenac Sodium  2 g Topical QID   DULoxetine  120 mg Oral Daily   gabapentin  300 mg Oral QHS   heparin  5,000 Units Subcutaneous Q8H   insulin aspart  0-15 Units Subcutaneous TID WC   insulin aspart  0-5 Units Subcutaneous QHS   insulin glargine-yfgn  70 Units Subcutaneous BID   latanoprost  1 drop Both Eyes QHS   lidocaine  1 patch Transdermal Q24H   metolazone  5 mg Oral Daily   Netarsudil Dimesylate  1 drop Left Eye QHS   pantoprazole  40 mg Oral Daily   prednisoLONE acetate  1 drop Both Eyes Q6H   rOPINIRole  1 mg Oral QHS   rosuvastatin  10 mg Oral Daily   Continuous Infusions:  cefTRIAXone (ROCEPHIN)  IV 2 g (04/26/23 1700)   PRN Meds:.acetaminophen **OR** acetaminophen, ondansetron **OR** ondansetron (ZOFRAN) IV, oxyCODONE  Current Outpatient Medications  Medication Instructions   acetaminophen (TYLENOL) 325-650 mg, Oral, Every 4 hours PRN   albuterol (VENTOLIN HFA) 108 (90 Base) MCG/ACT inhaler 2 puffs, Inhalation, Every 6 hours PRN   amLODipine (NORVASC) 10 mg, Oral, Daily   aspirin EC 81 mg, Oral, Daily, Swallow whole.   Brinzolamide-Brimonidine (SIMBRINZA) 1-0.2 % SUSP 1 drop, Both Eyes, 3 times daily   budesonide-formoterol (SYMBICORT) 160-4.5 MCG/ACT inhaler 2 puffs, Inhalation, 2 times daily   cetirizine (ZYRTEC) 10 mg, Oral, Daily PRN   Continuous Blood Gluc Sensor (DEXCOM G6 SENSOR) MISC 1 applicator, Subcutaneous, As directed, Change sensor every 10 days.  Continuous Blood Gluc Transmit (DEXCOM G6 TRANSMITTER) MISC 1 Device, Subcutaneous, As directed, Reuse 8 times with sensor changes.   diclofenac Sodium (VOLTAREN) 2 g, Topical, 4 times daily   DULoxetine (CYMBALTA) 120 mg, Oral, Daily   gabapentin (NEURONTIN) 600 mg, Oral, 3 times daily   GNP ULTICARE PEN NEEDLES 32G X 4 MM MISC USE TO inject insulin 2 TIMES DAILY   Insulin Disposable Pump (OMNIPOD 5  G6 POD, GEN 5,) MISC Subcutaneous, Daily   ipratropium-albuterol (DUONEB) 0.5-2.5 (3) MG/3ML SOLN 3 mLs, Nebulization, Every 6 hours PRN   latanoprost (XALATAN) 0.005 % ophthalmic solution 1 drop, Both Eyes, Daily   lidocaine (LIDODERM) 5 % 1 patch, Transdermal, Every 24 hours, Remove & Discard patch within 12 hours or as directed by MD   metolazone (ZAROXOLYN) 5 mg, Oral, Daily   Multiple Vitamins-Minerals (ONE-A-DAY WOMENS) tablet 1 tablet, Oral, Daily   nitroGLYCERIN (NITROSTAT) 0.4 mg, Sublingual, Every 5 min PRN   NovoLOG FlexPen 20 Units, Subcutaneous, 3 times daily with meals   nystatin (MYCOSTATIN/NYSTOP) powder 1 Application, Topical, 3 times daily   omeprazole (PRILOSEC) 40 mg, Oral, 2 times daily   ondansetron (ZOFRAN-ODT) 4 mg, Oral, Every 8 hours PRN   prednisoLONE acetate (PRED FORTE) 1 % ophthalmic suspension 1 drop, Both Eyes, 4 times daily   RHOPRESSA 0.02 % SOLN 1 drop, Left Eye, Daily   rOPINIRole (REQUIP) 1 mg, Oral, Daily at bedtime   rosuvastatin (CRESTOR) 10 mg, Oral, Daily   torsemide (DEMADEX) 100 mg, Oral, 2 times daily   traZODone (DESYREL) 100 mg, Oral, At bedtime PRN, for sleep   Tresiba FlexTouch 70 Units, Subcutaneous, 2 times daily   Vitamin D (Cholecalciferol) 1,000 Units, Oral, Daily    Diet Orders (From admission, onward)     Start     Ordered   04/26/23 0955  Diet renal/carb modified with fluid restriction Diet-HS Snack? Nothing; Fluid restriction: 1200 mL Fluid; Room service appropriate? Yes; Fluid consistency: Thin  Diet effective now       Question Answer Comment  Diet-HS Snack? Nothing   Fluid restriction: 1200 mL Fluid   Room service appropriate? Yes   Fluid consistency: Thin      04/26/23 0954            DVT prophylaxis: heparin injection 5,000 Units Start: 04/26/23 1400   Lab Results  Component Value Date   PLT 303 04/27/2023      Code Status: Full Code  Family Communication: no family at bedside   Status is:  Inpatient  Remains inpatient appropriate because: severity of illness  Level of care: Telemetry  Consultants:  Nephrology   Objective: Vitals:   04/26/23 2059 04/26/23 2115 04/26/23 2120 04/27/23 0512  BP: (!) 131/58   136/68  Pulse: 92  93 93  Resp: 18  18 18   Temp: 97.7 F (36.5 C)   97.6 F (36.4 C)  TempSrc: Axillary   Oral  SpO2: (!) 88% 95% 95% 98%  Weight:      Height:        Intake/Output Summary (Last 24 hours) at 04/27/2023 1053 Last data filed at 04/26/2023 2200 Gross per 24 hour  Intake 1060 ml  Output --  Net 1060 ml   Wt Readings from Last 3 Encounters:  04/25/23 (!) 156 kg  04/21/23 (!) 156 kg  04/11/23 (!) 155.2 kg    Examination:  Constitutional: NAD Eyes: no scleral icterus ENMT: Mucous membranes are moist.  Neck: normal, supple Respiratory: clear to  auscultation bilaterally, no wheezing, no crackles. Normal respiratory effort.  Cardiovascular: Regular rate and rhythm, no murmurs / rubs / gallops. No LE edema.  Abdomen: non distended, no tenderness. Bowel sounds positive.  Musculoskeletal: no clubbing / cyanosis.  Skin: no rashes Neurologic: No focal deficits, tremor noted on the right hand at rest and also with movement  Data Reviewed: I have independently reviewed following labs and imaging studies   CBC Recent Labs  Lab 04/25/23 1856 04/27/23 0439  WBC 10.3 8.3  HGB 12.8 12.3  HCT 40.9 38.9  PLT 359 303  MCV 89.1 89.6  MCH 27.9 28.3  MCHC 31.3 31.6  RDW 15.6* 15.7*  LYMPHSABS 3.3  --   MONOABS 0.8  --   EOSABS 0.2  --   BASOSABS 0.1  --     Recent Labs  Lab 04/21/23 1530 04/25/23 1856 04/27/23 0439  NA 135 135 135  K 4.2 3.7 3.2*  CL 80* 83* 83*  CO2 35* 36* 38*  GLUCOSE 289* 207* 303*  BUN 124* 129* 133*  CREATININE 1.96* 2.27* 2.43*  CALCIUM 9.7 9.4 9.7  AST  --   --  20  ALT  --   --  26  ALKPHOS  --   --  118  BILITOT  --   --  0.3  ALBUMIN  --   --  3.5  BNP  --  22.8  --      ------------------------------------------------------------------------------------------------------------------ No results for input(s): "CHOL", "HDL", "LDLCALC", "TRIG", "CHOLHDL", "LDLDIRECT" in the last 72 hours.  Lab Results  Component Value Date   HGBA1C 13.6 (H) 04/13/2023   ------------------------------------------------------------------------------------------------------------------ No results for input(s): "TSH", "T4TOTAL", "T3FREE", "THYROIDAB" in the last 72 hours.  Invalid input(s): "FREET3"  Cardiac Enzymes No results for input(s): "CKMB", "TROPONINI", "MYOGLOBIN" in the last 168 hours.  Invalid input(s): "CK" ------------------------------------------------------------------------------------------------------------------    Component Value Date/Time   BNP 22.8 04/25/2023 1856    CBG: Recent Labs  Lab 04/26/23 1134 04/26/23 1251 04/26/23 1641 04/26/23 2105 04/27/23 0759  GLUCAP 348* 389* 254* 312* 295*    Recent Results (from the past 240 hour(s))  Culture, blood (Routine X 2) w Reflex to ID Panel     Status: None (Preliminary result)   Collection Time: 04/26/23  3:21 AM   Specimen: BLOOD  Result Value Ref Range Status   Specimen Description   Final    BLOOD BLOOD LEFT HAND Performed at [facility], [address] [place], [place] [address]    Special Requests   Final    BOTTLES DRAWN AEROBIC AND ANAEROBIC Blood Culture adequate volume Performed at [facility], [address], [place], [place] [address]    Culture   Final    NO GROWTH 1 DAY Performed at [facility], [address] [place], [place] [address]    Report Status PENDING  Incomplete  Culture, blood (Routine X 2) w Reflex to ID Panel     Status: None (Preliminary result)   Collection Time: 04/26/23  3:21 AM   Specimen: BLOOD  Result Value Ref Range Status   Specimen Description   Final    BLOOD RIGHT ANTECUBITAL Performed at  [facility], [address] [place], [place] [address]    Special Requests   Final    BOTTLES DRAWN AEROBIC AND ANAEROBIC Blood Culture results may not be optimal due to an excessive volume of blood received in culture bottles Performed at [facility], [address], [place], [place] [address]    Culture   Final    NO GROWTH 1 DAY Performed at [facility], [address], [place], [place] [address]    Report Status PENDING  Incomplete     Radiology Studies: No results found.   Pamella Pert, MD, PhD [facility]  Between 7 am - 7 pm I am available, please contact me via Amion (for emergencies) or Securechat (non urgent messages)  Between 7 pm - 7 am I am not available, please contact night coverage MD/APP via Amion

## 2023-04-27 NOTE — Plan of Care (Signed)
  Problem: Fluid Volume: Goal: Ability to maintain a balanced intake and output will improve Outcome: Progressing   Problem: Metabolic: Goal: Ability to maintain appropriate glucose levels will improve Outcome: Progressing   Problem: Nutritional: Goal: Maintenance of adequate nutrition will improve Outcome: Progressing Goal: Progress toward achieving an optimal weight will improve Outcome: Progressing   Problem: Skin Integrity: Goal: Risk for impaired skin integrity will decrease Outcome: Progressing   Problem: Tissue Perfusion: Goal: Adequacy of tissue perfusion will improve Outcome: Progressing

## 2023-04-27 NOTE — Plan of Care (Signed)

## 2023-04-28 DIAGNOSIS — N390 Urinary tract infection, site not specified: Secondary | ICD-10-CM | POA: Diagnosis not present

## 2023-04-28 LAB — CBC
HCT: 38 % (ref 36.0–46.0)
Hemoglobin: 11.9 g/dL — ABNORMAL LOW (ref 12.0–15.0)
MCH: 28.3 pg (ref 26.0–34.0)
MCHC: 31.3 g/dL (ref 30.0–36.0)
MCV: 90.3 fL (ref 80.0–100.0)
Platelets: 300 10*3/uL (ref 150–400)
RBC: 4.21 MIL/uL (ref 3.87–5.11)
RDW: 15.2 % (ref 11.5–15.5)
WBC: 8.9 10*3/uL (ref 4.0–10.5)
nRBC: 0 % (ref 0.0–0.2)

## 2023-04-28 LAB — GLUCOSE, CAPILLARY
Glucose-Capillary: 284 mg/dL — ABNORMAL HIGH (ref 70–99)
Glucose-Capillary: 228 mg/dL — ABNORMAL HIGH (ref 70–99)
Glucose-Capillary: 270 mg/dL — ABNORMAL HIGH (ref 70–99)
Glucose-Capillary: 361 mg/dL — ABNORMAL HIGH (ref 70–99)

## 2023-04-28 LAB — COMPREHENSIVE METABOLIC PANEL
ALT: 24 U/L (ref 0–44)
AST: 20 U/L (ref 15–41)
Albumin: 3.3 g/dL — ABNORMAL LOW (ref 3.5–5.0)
Alkaline Phosphatase: 110 U/L (ref 38–126)
Anion gap: 13 (ref 5–15)
BUN: 128 mg/dL — ABNORMAL HIGH (ref 6–20)
CO2: 36 mmol/L — ABNORMAL HIGH (ref 22–32)
Calcium: 9.3 mg/dL (ref 8.9–10.3)
Chloride: 87 mmol/L — ABNORMAL LOW (ref 98–111)
Creatinine, Ser: 2.29 mg/dL — ABNORMAL HIGH (ref 0.44–1.00)
GFR, Estimated: 26 mL/min — ABNORMAL LOW (ref >60)
Glucose, Bld: 301 mg/dL — ABNORMAL HIGH (ref 70–99)
Potassium: 3.8 mmol/L (ref 3.5–5.1)
Sodium: 136 mmol/L (ref 135–145)
Total Bilirubin: 0.5 mg/dL (ref 0.3–1.2)
Total Protein: 8.4 g/dL — ABNORMAL HIGH (ref 6.5–8.1)

## 2023-04-28 LAB — MAGNESIUM: Magnesium: 2.1 mg/dL (ref 1.7–2.4)

## 2023-04-28 LAB — CULTURE, BLOOD (ROUTINE X 2)

## 2023-04-28 MED ORDER — SODIUM CHLORIDE 0.9 % IV SOLN
12.5000 mg | Freq: Four times a day (QID) | INTRAVENOUS | Status: AC | PRN
  Administered 2023-04-28: 12.5 mg via INTRAVENOUS
  Filled 2023-04-28: qty 12.5

## 2023-04-28 MED ORDER — OXYCODONE HCL 5 MG PO TABS
5.0000 mg | ORAL_TABLET | Freq: Four times a day (QID) | ORAL | Status: DC | PRN
  Administered 2023-04-28 – 2023-04-29 (×3): 5 mg via ORAL
  Filled 2023-04-28 (×3): qty 1

## 2023-04-28 NOTE — Progress Notes (Signed)
Patient ID: [name], female   DOB: 1976/07/02, 47 y.o.   MRN: [id number] [facility] Progress Note   Assessment/ Plan:   1.  Acute kidney injury on chronic kidney disease stage IIIb: With stepwise progression of underlying chronic kidney disease after suffering episodes of acute kidney injury.  Current acute injury suspected to be hemodynamically mediated with decreased effective arterial blood volume/diuretics with renal hypoperfusion.  Yesterday, diuretic doses decreased in order to try and improve intravascular volume without need to administer fluids.  Slight improvement of BUN/creatinine noted on labs this morning and will continue to monitor on monotherapy torsemide 100 mg daily (previously on 100 mg twice daily with metolazone 5 mg daily).  Reminded about the importance of cocaine cessation.  No hard indication for dialysis. 2.  Tremors: She apparently has a mild underlying/baseline chronic tremor that was exacerbated over the last few weeks; likely worsened by gabapentin toxicity in the setting of worsening renal function as well as azotemia.  Gabapentin dose decreased and will continue to monitor. 3.  Urinary tract infection: Suspected in the setting of pyuria seen on initial urinalysis and cultures pending.  On empiric antimicrobial therapy with ceftriaxone with negative urine and blood cultures so far. 4.  Chronic hypoxic respiratory failure: Continue oxygen supplementation per her outpatient prescription with nocturnal BiPAP for OHS/OSA. 5.  Hypertension: Blood pressure currently acceptable; indeed may have a component of challenges with management given her habitus.  Diuretics decreased. 6.  Hypokalemia: Corrected via intravenous route after patient discontinued oral supplement.   Subjective:   Had nausea and headache earlier this morning but apparently feeling better at this time.  Inquires about discharge home.   Objective:   BP 108/62 (BP Location: Left Wrist)    Pulse 93   Temp 98.1 F (36.7 C) (Oral)   Resp 16   Ht 5\' 2"  (1.575 m)   Wt (!) 150 kg   SpO2 97%   BMI 60.48 kg/m   Intake/Output Summary (Last 24 hours) at 04/28/2023 1142 Last data filed at 04/28/2023 1100 Gross per 24 hour  Intake 1304.12 ml  Output 600 ml  Net 704.12 ml   Weight change:   Physical Exam: Gen: Comfortably sitting up on the side of her bed engaged in video call CVS: Pulse regular rhythm, normal rate, S1 and S2 normal Resp: Diminished breath sounds over bases, no distinct rales or rhonchi Abd: Soft, obese, nontender, bowel sounds normal Ext: Status post left BKA and right TMA, 1+ right lower extremity edema  Imaging: MR BRAIN WO CONTRAST  Result Date: 04/27/2023 CLINICAL DATA:  Provided history: Transient ischemic attack (TIA). EXAM: MRI HEAD WITHOUT CONTRAST TECHNIQUE: Multiplanar, multiecho pulse sequences of the brain and surrounding structures were obtained without intravenous contrast. COMPARISON:  Head CT 10/22/2021.  Brain MRI 07/13/2014. FINDINGS: Brain: No age advanced or lobar predominant parenchymal atrophy. There are a few small nonspecific T2 FLAIR hyperintense remote insults scattered within the bilateral cerebral white matter, new from the prior brain MRI of 07/13/2014. No cortical encephalomalacia is identified. There is no acute infarct. No evidence of an intracranial mass. No chronic intracranial blood products. No extra-axial fluid collection. No midline shift. Vascular: Maintained flow voids within the proximal large arterial vessels. Skull and upper cervical spine: No focal suspicious marrow lesion. Sinuses/Orbits: No mass or acute finding within the imaged orbits. 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 Prior bilateral ocular lens replacement. No significant paranasal sinus disease. IMPRESSION: 1. No evidence of an acute intracranial abnormality. 2.  There are a few small nonspecific T2 FLAIR hyperintense remote insults within the cerebral white matter, new from  the prior brain MRI of 07/13/2014. 3. Otherwise unremarkable non-contrast MRI appearance of the brain. Electronically Signed   By: Jackey Loge D.O.   On: 04/27/2023 19:25    Labs: BMET Recent Labs  Lab 04/21/23 1530 04/25/23 1856 04/27/23 0439 04/28/23 0426  NA 135 135 135 136  K 4.2 3.7 3.2* 3.8  CL 80* 83* 83* 87*  CO2 35* 36* 38* 36*  GLUCOSE 289* 207* 303* 301*  BUN 124* 129* 133* 128*  CREATININE 1.96* 2.27* 2.43* 2.29*  CALCIUM 9.7 9.4 9.7 9.3   CBC Recent Labs  Lab 04/25/23 1856 04/27/23 0439 04/28/23 0426  WBC 10.3 8.3 8.9  NEUTROABS 5.9  --   --   HGB 12.8 12.3 11.9*  HCT 40.9 38.9 38.0  MCV 89.1 89.6 90.3  PLT 359 303 300    Medications:     amLODipine  10 mg Oral Daily   aspirin EC  81 mg Oral Daily   Brinzolamide-Brimonidine  1 drop Both Eyes TID   cholecalciferol  1,000 Units Oral Daily   diclofenac Sodium  2 g Topical QID   DULoxetine  120 mg Oral Daily   gabapentin  300 mg Oral QHS   heparin  5,000 Units Subcutaneous Q8H   insulin aspart  0-15 Units Subcutaneous TID WC   insulin aspart  0-5 Units Subcutaneous QHS   insulin aspart  6 Units Subcutaneous TID WC   insulin glargine-yfgn  70 Units Subcutaneous BID   latanoprost  1 drop Both Eyes QHS   lidocaine  1 patch Transdermal Q24H   Netarsudil Dimesylate  1 drop Left Eye QHS   pantoprazole  40 mg Oral Daily   prednisoLONE acetate  1 drop Both Eyes Q6H   rOPINIRole  1 mg Oral QHS   rosuvastatin  10 mg Oral Daily   torsemide  100 mg Oral Daily    Zetta Bills, MD 04/28/2023, 11:42 AM

## 2023-04-28 NOTE — Progress Notes (Signed)
PROGRESS NOTE  [name] ZOX:[id number] DOB: 10-26-1976 DOA: 04/25/2023 PCP: Latrelle Dodrill, MD   LOS: 2 days   Brief Narrative / Interim history: 47 year old female with morbid obesity, OSA on CPAP, obesity hypoventilation, asthma/COPD, cocaine use, CKD 4 with baseline creatinine around 1.7 followed by nephrology as an outpatient who comes into the hospital with worsening tremors and significant nausea over the last 2 weeks.  Subjective / 24h Interval events: As of a headache and nausea this morning.  This has been happening on a daily basis for the past 2-3 weeks  Assesement and Plan: Principal Problem:   Acute UTI (urinary tract infection) Active Problems:   Type 2 diabetes mellitus with hyperglycemia ([name])   Obstructive sleep apnea (adult) (pediatric)   Obesity hypoventilation syndrome ([name])   Hypertension   GERD   Restless leg syndrome   Hyperlipidemia associated with type 2 diabetes mellitus ([name])  Principal problem AKI on CKD 4 -patient's creatinine 2.2 on admission, up to 2.4 but now trending down some at 2.2.  BUN significantly elevated 128.  Nephrology following  Active problems Possible UTI -on ceftriaxone, cultures still negative  Increased tremors -decrease gabapentin dose given worsening of her renal function.  MRI of the brain without acute findings  OHS, OSA -continue BiPAP, has chronic BiPAP at home  Essential hypertension -continue amlodipine, torsemide changed to 100 mg daily per nephrology  DM2, poorly controlled, with hyperglycemia -continue glargine, sliding scale  Hyperlipidemia-continue statin  Obesity, morbid-BMI 62.  She would benefit from weight loss  Scheduled Meds:  amLODipine  10 mg Oral Daily   aspirin EC  81 mg Oral Daily   Brinzolamide-Brimonidine  1 drop Both Eyes TID   cholecalciferol  1,000 Units Oral Daily   diclofenac Sodium  2 g Topical QID   DULoxetine  120 mg Oral Daily   gabapentin  300 mg Oral QHS   heparin  5,000  Units Subcutaneous Q8H   insulin aspart  0-15 Units Subcutaneous TID WC   insulin aspart  0-5 Units Subcutaneous QHS   insulin aspart  6 Units Subcutaneous TID WC   insulin glargine-yfgn  70 Units Subcutaneous BID   latanoprost  1 drop Both Eyes QHS   lidocaine  1 patch Transdermal Q24H   Netarsudil Dimesylate  1 drop Left Eye QHS   pantoprazole  40 mg Oral Daily   prednisoLONE acetate  1 drop Both Eyes Q6H   rOPINIRole  1 mg Oral QHS   rosuvastatin  10 mg Oral Daily   torsemide  100 mg Oral Daily   Continuous Infusions:  cefTRIAXone (ROCEPHIN)  IV 2 g (04/27/23 1638)   PRN Meds:.acetaminophen **OR** acetaminophen, metoCLOPramide (REGLAN) injection, ondansetron **OR** ondansetron (ZOFRAN) IV  Current Outpatient Medications  Medication Instructions   acetaminophen (TYLENOL) 325-650 mg, Oral, Every 4 hours PRN   albuterol (VENTOLIN HFA) 108 (90 Base) MCG/ACT inhaler 2 puffs, Inhalation, Every 6 hours PRN   amLODipine (NORVASC) 10 mg, Oral, Daily   aspirin EC 81 mg, Oral, Daily, Swallow whole.   Brinzolamide-Brimonidine (SIMBRINZA) 1-0.2 % SUSP 1 drop, Both Eyes, 3 times daily   budesonide-formoterol (SYMBICORT) 160-4.5 MCG/ACT inhaler 2 puffs, Inhalation, 2 times daily   cetirizine (ZYRTEC) 10 mg, Oral, Daily PRN   Continuous Blood Gluc Sensor (DEXCOM G6 SENSOR) MISC 1 applicator, Subcutaneous, As directed, Change sensor every 10 days.   Continuous Blood Gluc Transmit (DEXCOM G6 TRANSMITTER) MISC 1 Device, Subcutaneous, As directed, Reuse 8 times with sensor changes.   diclofenac Sodium (  VOLTAREN) 2 g, Topical, 4 times daily   DULoxetine (CYMBALTA) 120 mg, Oral, Daily   gabapentin (NEURONTIN) 600 mg, Oral, 3 times daily   GNP ULTICARE PEN NEEDLES 32G X 4 MM MISC USE TO inject insulin 2 TIMES DAILY   Insulin Disposable Pump (OMNIPOD 5 G6 POD, GEN 5,) MISC Subcutaneous, Daily   ipratropium-albuterol (DUONEB) 0.5-2.5 (3) MG/3ML SOLN 3 mLs, Nebulization, Every 6 hours PRN   latanoprost  (XALATAN) 0.005 % ophthalmic solution 1 drop, Both Eyes, Daily   lidocaine (LIDODERM) 5 % 1 patch, Transdermal, Every 24 hours, Remove & Discard patch within 12 hours or as directed by MD   metolazone (ZAROXOLYN) 5 mg, Oral, Daily   Multiple Vitamins-Minerals (ONE-A-DAY WOMENS) tablet 1 tablet, Oral, Daily   nitroGLYCERIN (NITROSTAT) 0.4 mg, Sublingual, Every 5 min PRN   NovoLOG FlexPen 20 Units, Subcutaneous, 3 times daily with meals   nystatin (MYCOSTATIN/NYSTOP) powder 1 Application, Topical, 3 times daily   omeprazole (PRILOSEC) 40 mg, Oral, 2 times daily   ondansetron (ZOFRAN-ODT) 4 mg, Oral, Every 8 hours PRN   prednisoLONE acetate (PRED FORTE) 1 % ophthalmic suspension 1 drop, Both Eyes, 4 times daily   RHOPRESSA 0.02 % SOLN 1 drop, Left Eye, Daily   rOPINIRole (REQUIP) 1 mg, Oral, Daily at bedtime   rosuvastatin (CRESTOR) 10 mg, Oral, Daily   torsemide (DEMADEX) 100 mg, Oral, 2 times daily   traZODone (DESYREL) 100 mg, Oral, At bedtime PRN, for sleep   Tresiba FlexTouch 70 Units, Subcutaneous, 2 times daily   Vitamin D (Cholecalciferol) 1,000 Units, Oral, Daily    Diet Orders (From admission, onward)     Start     Ordered   04/27/23 1257  Diet regular Fluid consistency: Thin; Fluid restriction: 1500 mL Fluid  Diet effective now       Question Answer Comment  Fluid consistency: Thin   Fluid restriction: 1500 mL Fluid      04/27/23 1256            DVT prophylaxis: heparin injection 5,000 Units Start: 04/26/23 1400   Lab Results  Component Value Date   PLT 300 04/28/2023      Code Status: Full Code  Family Communication: no family at bedside   Status is: Inpatient  Remains inpatient appropriate because: severity of illness  Level of care: Telemetry  Consultants:  Nephrology   Objective: Vitals:   04/27/23 2015 04/27/23 2122 04/28/23 0538 04/28/23 0539  BP:  132/65 108/62   Pulse: (!) 106 97 93   Resp: 16     Temp:  98.1 F (36.7 C) 98.1 F (36.7 C)    TempSrc:  Axillary Oral   SpO2: 94% 94% 97%   Weight:    (!) 150 kg  Height:        Intake/Output Summary (Last 24 hours) at 04/28/2023 1103 Last data filed at 04/28/2023 0400 Gross per 24 hour  Intake 1304.12 ml  Output --  Net 1304.12 ml    Wt Readings from Last 3 Encounters:  04/28/23 (!) 150 kg  04/21/23 (!) 156 kg  04/11/23 (!) 155.2 kg    Examination:  Constitutional: NAD Eyes: lids and conjunctivae normal, no scleral icterus ENMT: mmm Neck: normal, supple Respiratory: clear to auscultation bilaterally, no wheezing, no crackles. Normal respiratory effort.  Cardiovascular: Regular rate and rhythm, no murmurs / rubs / gallops. No LE edema. Abdomen: soft, no distention, no tenderness. Bowel sounds positive.  Skin: no rashes  Data Reviewed: I have independently  reviewed following labs and imaging studies   CBC Recent Labs  Lab 04/25/23 1856 04/27/23 0439 04/28/23 0426  WBC 10.3 8.3 8.9  HGB 12.8 12.3 11.9*  HCT 40.9 38.9 38.0  PLT 359 303 300  MCV 89.1 89.6 90.3  MCH 27.9 28.3 28.3  MCHC 31.3 31.6 31.3  RDW 15.6* 15.7* 15.2  LYMPHSABS 3.3  --   --   MONOABS 0.8  --   --   EOSABS 0.2  --   --   BASOSABS 0.1  --   --      Recent Labs  Lab 04/21/23 1530 04/25/23 1856 04/27/23 0439 04/28/23 0426  NA 135 135 135 136  K 4.2 3.7 3.2* 3.8  CL 80* 83* 83* 87*  CO2 35* 36* 38* 36*  GLUCOSE 289* 207* 303* 301*  BUN 124* 129* 133* 128*  CREATININE 1.96* 2.27* 2.43* 2.29*  CALCIUM 9.7 9.4 9.7 9.3  AST  --   --  20 20  ALT  --   --  26 24  ALKPHOS  --   --  118 110  BILITOT  --   --  0.3 0.5  ALBUMIN  --   --  3.5 3.3*  MG  --   --   --  2.1  BNP  --  22.8  --   --      ------------------------------------------------------------------------------------------------------------------ No results for input(s): "CHOL", "HDL", "LDLCALC", "TRIG", "CHOLHDL", "LDLDIRECT" in the last 72 hours.  Lab Results  Component Value Date   HGBA1C 13.6 (H)  04/13/2023   ------------------------------------------------------------------------------------------------------------------ No results for input(s): "TSH", "T4TOTAL", "T3FREE", "THYROIDAB" in the last 72 hours.  Invalid input(s): "FREET3"  Cardiac Enzymes No results for input(s): "CKMB", "TROPONINI", "MYOGLOBIN" in the last 168 hours.  Invalid input(s): "CK" ------------------------------------------------------------------------------------------------------------------    Component Value Date/Time   BNP 22.8 04/25/2023 1856    CBG: Recent Labs  Lab 04/27/23 0759 04/27/23 1156 04/27/23 1645 04/27/23 2116 04/28/23 0730  GLUCAP 295* 311* 238* 382* 284*     Recent Results (from the past 240 hour(s))  Culture, blood (Routine X 2) w Reflex to ID Panel     Status: None (Preliminary result)   Collection Time: 04/26/23  3:21 AM   Specimen: BLOOD  Result Value Ref Range Status   Specimen Description   Final    BLOOD BLOOD LEFT HAND Performed at [facility], [address], [place], [place] [address]    Special Requests   Final    BOTTLES DRAWN AEROBIC AND ANAEROBIC Blood Culture adequate volume Performed at [facility], [address], [place], [place] [address]    Culture   Final    NO GROWTH 2 DAYS Performed at [facility], [address], [place], [place] [address]    Report Status PENDING  Incomplete  Culture, blood (Routine X 2) w Reflex to ID Panel     Status: None (Preliminary result)   Collection Time: 04/26/23  3:21 AM   Specimen: BLOOD  Result Value Ref Range Status   Specimen Description   Final    BLOOD RIGHT ANTECUBITAL Performed at [facility], [address] [place], [place] [address]    Special Requests   Final    BOTTLES DRAWN AEROBIC AND ANAEROBIC Blood Culture results may not be optimal due to an excessive volume of blood received in culture bottles Performed at [facility], [address] [place], [place] [address]  Culture   Final    NO GROWTH 2 DAYS Performed at [facility], [address], [place], [place] [address]    Report Status PENDING  Incomplete     Radiology Studies: MR BRAIN WO CONTRAST  Result Date: 04/27/2023 CLINICAL DATA:  Provided history: Transient ischemic attack (TIA). EXAM: MRI HEAD WITHOUT CONTRAST TECHNIQUE: Multiplanar, multiecho pulse sequences of the brain and surrounding structures were obtained without intravenous contrast. COMPARISON:  Head CT 10/22/2021.  Brain MRI 07/13/2014. FINDINGS: Brain: No age advanced or lobar predominant parenchymal atrophy. There are a few small nonspecific T2 FLAIR hyperintense remote insults scattered within the bilateral cerebral white matter, new from the prior brain MRI of 07/13/2014. No cortical encephalomalacia is identified. There is no acute infarct. No evidence of an intracranial mass. No chronic intracranial blood products. No extra-axial fluid collection. No midline shift. Vascular: Maintained flow voids within the proximal large arterial vessels. Skull and upper cervical spine: No focal suspicious marrow lesion. Sinuses/Orbits: No mass or acute finding within the imaged orbits. 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 Prior bilateral ocular lens replacement. No significant paranasal sinus disease. IMPRESSION: 1. No evidence of an acute intracranial abnormality. 2. There are a few small nonspecific T2 FLAIR hyperintense remote insults within the cerebral white matter, new from the prior brain MRI of 07/13/2014. 3. Otherwise unremarkable non-contrast MRI appearance of the brain. Electronically Signed   By: Jackey Loge D.O.   On: 04/27/2023 19:25     Pamella Pert, MD, PhD [facility]  Between 7 am - 7 pm I am available, please contact me via Amion (for emergencies) or Securechat (non urgent messages)  Between 7 pm - 7 am I am not available, please contact night coverage  MD/APP via Amion

## 2023-04-29 DIAGNOSIS — N390 Urinary tract infection, site not specified: Secondary | ICD-10-CM | POA: Diagnosis not present

## 2023-04-29 LAB — CBC
HCT: 40.5 % (ref 36.0–46.0)
Hemoglobin: 12.6 g/dL (ref 12.0–15.0)
MCH: 28.1 pg (ref 26.0–34.0)
MCHC: 31.1 g/dL (ref 30.0–36.0)
MCV: 90.4 fL (ref 80.0–100.0)
Platelets: 305 10*3/uL (ref 150–400)
RBC: 4.48 MIL/uL (ref 3.87–5.11)
RDW: 15.6 % — ABNORMAL HIGH (ref 11.5–15.5)
WBC: 9.4 10*3/uL (ref 4.0–10.5)
nRBC: 0 % (ref 0.0–0.2)

## 2023-04-29 LAB — COMPREHENSIVE METABOLIC PANEL
ALT: 25 U/L (ref 0–44)
AST: 19 U/L (ref 15–41)
Albumin: 3.5 g/dL (ref 3.5–5.0)
Alkaline Phosphatase: 110 U/L (ref 38–126)
Anion gap: 12 (ref 5–15)
BUN: 120 mg/dL — ABNORMAL HIGH (ref 6–20)
CO2: 38 mmol/L — ABNORMAL HIGH (ref 22–32)
Calcium: 9.5 mg/dL (ref 8.9–10.3)
Chloride: 86 mmol/L — ABNORMAL LOW (ref 98–111)
Creatinine, Ser: 2.05 mg/dL — ABNORMAL HIGH (ref 0.44–1.00)
GFR, Estimated: 30 mL/min — ABNORMAL LOW (ref >60)
Glucose, Bld: 237 mg/dL — ABNORMAL HIGH (ref 70–99)
Potassium: 3.6 mmol/L (ref 3.5–5.1)
Sodium: 136 mmol/L (ref 135–145)
Total Bilirubin: 0.5 mg/dL (ref 0.3–1.2)
Total Protein: 8.6 g/dL — ABNORMAL HIGH (ref 6.5–8.1)

## 2023-04-29 LAB — CULTURE, BLOOD (ROUTINE X 2)
Culture: NO GROWTH
Special Requests: ADEQUATE

## 2023-04-29 LAB — MAGNESIUM: Magnesium: 2 mg/dL (ref 1.7–2.4)

## 2023-04-29 LAB — GLUCOSE, CAPILLARY
Glucose-Capillary: 278 mg/dL — ABNORMAL HIGH (ref 70–99)
Glucose-Capillary: 252 mg/dL — ABNORMAL HIGH (ref 70–99)
Glucose-Capillary: 256 mg/dL — ABNORMAL HIGH (ref 70–99)
Glucose-Capillary: 295 mg/dL — ABNORMAL HIGH (ref 70–99)

## 2023-04-29 MED ORDER — PROMETHAZINE HCL 12.5 MG PO TABS: 12.5000 mg | ORAL_TABLET | Freq: Four times a day (QID) | ORAL | 0 refills | Status: DC | PRN

## 2023-04-29 MED ORDER — METOLAZONE 5 MG PO TABS
5.0000 mg | ORAL_TABLET | ORAL | 3 refills | Status: DC
Start: 2023-04-29 — End: 2023-05-28

## 2023-04-29 MED ORDER — GABAPENTIN 300 MG PO CAPS: 600.0000 mg | ORAL_CAPSULE | Freq: Every day | ORAL | 0 refills | Status: DC

## 2023-04-29 MED ORDER — HYDROMORPHONE HCL 1 MG/ML IJ SOLN
0.5000 mg | Freq: Once | INTRAMUSCULAR | Status: AC
  Administered 2023-04-29: 0.5 mg via INTRAVENOUS
  Filled 2023-04-29: qty 0.5

## 2023-04-29 MED ORDER — TORSEMIDE 100 MG PO TABS: 100.0000 mg | ORAL_TABLET | Freq: Every day | ORAL | 0 refills | Status: DC

## 2023-04-29 NOTE — Plan of Care (Signed)

## 2023-04-29 NOTE — TOC Transition Note (Signed)
Transition of Care [facility]) - CM/SW Discharge Note   Patient Details  Name: [name] MRN: [id number] Date of Birth: December 02, 1976  Transition of Care [facility] CM/SW Contact:  Larrie Kass, LCSW Phone Number: 04/29/2023, 1:22 PM   Clinical Narrative:    Pt to d/c home. PT[facility] called . No additional TOC needs , TOC sign off.       Barriers to Discharge: Continued Medical Work up   Patient Goals and CMS Choice      Discharge Placement                         Discharge Plan and Services Additional resources added to the After Visit Summary for                                       Social Determinants of Health (SDOH) Interventions SDOH Screenings   Food Insecurity: No Food Insecurity (04/27/2023)  Housing: Medium Risk (04/27/2023)  Transportation Needs: No Transportation Needs (04/27/2023)  Utilities: Not At Risk (04/27/2023)  Alcohol Screen: Low Risk  (04/14/2023)  Depression (PHQ2-9): Medium Risk (03/07/2023)  Financial Resource Strain: High Risk (04/15/2023)  Stress: Stress Concern Present (02/19/2021)  Tobacco Use: Medium Risk (04/26/2023)     Readmission Risk Interventions    03/22/2023   10:47 AM 05/24/2022   11:33 AM  Readmission Risk Prevention Plan  Transportation Screening Complete Complete  Medication Review Oceanographer) Complete Complete  PCP or Specialist appointment within 3-5 days of discharge Complete Complete  HRI or Home Care Consult Complete Complete  SW Recovery Care/Counseling Consult Complete Complete  Palliative Care Screening Not Applicable Not Applicable  Skilled Nursing Facility Not Applicable Patient Refused

## 2023-04-29 NOTE — Discharge Summary (Signed)
Physician Discharge Summary  [name] JYN:[id number] DOB: [birth year] DOA: 04/25/2023  PCP: Latrelle Dodrill, MD  Admit date: 04/25/2023 Discharge date: 04/29/2023  Admitted From: home Disposition:  home  Recommendations for Outpatient Follow-up:  Follow up with Nephrology in 1-2 weeks Please obtain BMP/CBC in one week  Home Health: none Equipment/Devices: none  Discharge Condition: stable CODE STATUS: Full code Diet Orders (From admission, onward)     Start     Ordered   04/27/23 1257  Diet regular Fluid consistency: Thin; Fluid restriction: 1500 mL Fluid  Diet effective now       Question Answer Comment  Fluid consistency: Thin   Fluid restriction: 1500 mL Fluid      04/27/23 1256            HPI: Per admitting MD, [name] is a 47 y.o. female with medical history significant of left groin abscess, chronic diastolic heart failure, nonobstructive CAD, left foot osteomyelitis, left BKA, OSA on CPAP, obesity hypoventilation, asthma/COPD, chronic respiratory failure with hypoxia, history of chest pain, costochondritis, history of acute tubular necrosis, bipolar 2 disorder, right carpal tunnel syndrome, chronic pain syndrome, costochondritis, depression, drug-seeking behavior, depression, type II DM, GERD, hyperlipidemia, GERD, hypertension, morbid obesity, peripheral neuropathy, history of pneumonia, rectal fissure, restless leg syndrome, history of SVT, syncopal episode, UTI, urinary frequency, urinary incontinence, vaginal candidiasis, varicose veins who presented to the emergency department with tremors, mild dysuria and urinary frequency.  No fever, chills or night sweats. No sore throat, rhinorrhea, dyspnea, wheezing or hemoptysis.  No chest pain, palpitations, diaphoresis, PND, orthopnea or pitting edema of the lower extremities.  No  abdominal pain, diarrhea, constipation, melena or hematochezia.  No polyuria, polydipsia, polyphagia or blurred vision.   Hospital  Course / Discharge diagnoses: Principal Problem:   Acute UTI (urinary tract infection) Active Problems:   Type 2 diabetes mellitus with hyperglycemia ([name])   Obstructive sleep apnea (adult) (pediatric)   Obesity hypoventilation syndrome ([name])   Hypertension   GERD   Restless leg syndrome   Hyperlipidemia associated with type 2 diabetes mellitus ([name])   Principal problem AKI on CKD 4 -patient was admitted to the hospital with acute kidney injury with a creatinine up to 2.4 and BUN significantly elevated in the 130s.  Nephrology was consulted and followed patient while hospitalized.  It was felt like she was intravascularly depleted, she was given limited fluids and her Demadex was decreased from BID at home to daily, and metolazone from daily to 3 times weekly.  With that, her renal function started to improve, she has good urine output, and will be discharged home in stable condition.  She will have outpatient follow-up with nephrology and repeat blood work in office  Active problems Possible UTI -received 3 days of ceftriaxone while hospitalized, no symptoms, cultures negative.  Does not need further antibiotics upon discharge  Increased tremors -decrease gabapentin dose given worsening of her renal function.  MRI of the brain without acute findings.  Tremors improved upon discharge OHS, OSA -continue BiPAP, has chronic BiPAP at home Essential hypertension -continue amlodipine, torsemide changed to 100 mg daily per nephrology DM2, poorly controlled, with hyperglycemia -continue home regimen on discharge Hyperlipidemia-continue statin Obesity, morbid-BMI 60.  She would benefit from weight loss  Sepsis ruled out   Discharge Instructions   Allergies as of 04/29/2023       Reactions   Cefepime Other (See Comments)   AKI, see records from [facility] hospitalization in January 2020.  Other reaction(s): Other (See Comments) "Shut down organs/kidneys" Note pt has tolerated Rocephin and  Keflex Other reaction(s): Not available   Iodine Other (See Comments)   Kidney dysfunction   Kiwi Extract Shortness Of Breath, Swelling, Anaphylaxis   Other reaction(s): Not available   Morphine And Codeine Nausea And Vomiting   Pentoxifylline Nausea And Vomiting   Trental   Cephalosporins Other (See Comments)   Pt states that they cause her kidneys to shut down   Toradol [ketorolac Tromethamine] Other (See Comments)   Feels like something is crawling on me   Nubain [nalbuphine Hcl] Other (See Comments)   "FEELS LIKE SOMETHING CRAWLING ON ME" Hallucinations         Medication List     TAKE these medications    acetaminophen 325 MG tablet Commonly known as: TYLENOL Take 1-2 tablets (325-650 mg total) by mouth every 4 (four) hours as needed for mild pain.   albuterol 108 (90 Base) MCG/ACT inhaler Commonly known as: Ventolin HFA Inhale 2 puffs into the lungs every 6 (six) hours as needed for wheezing or shortness of breath.   amLODipine 10 MG tablet Commonly known as: NORVASC Take 1 tablet (10 mg total) by mouth daily.   aspirin EC 81 MG tablet Take 1 tablet (81 mg total) by mouth daily. Swallow whole.   budesonide-formoterol 160-4.5 MCG/ACT inhaler Commonly known as: Symbicort Inhale 2 puffs into the lungs 2 (two) times daily.   cetirizine 10 MG tablet Commonly known as: ZYRTEC Take 1 tablet (10 mg total) by mouth daily as needed for allergies.   Dexcom G6 Sensor Misc Inject 1 applicator into the skin as directed. Change sensor every 10 days.   Dexcom G6 Transmitter Misc Inject 1 Device into the skin as directed. Reuse 8 times with sensor changes.   diclofenac Sodium 1 % Gel Commonly known as: Voltaren Apply 2 g topically 4 (four) times daily.   DULoxetine 60 MG capsule Commonly known as: CYMBALTA TAKE 2 CAPSULES BY MOUTH DAILY   gabapentin 300 MG capsule Commonly known as: NEURONTIN Take 2 capsules (600 mg total) by mouth at bedtime. What changed: when  to take this   GNP UltiCare Pen Needles 32G X 4 MM Misc Generic drug: Insulin Pen Needle USE TO inject insulin 2 TIMES DAILY   ipratropium-albuterol 0.5-2.5 (3) MG/3ML Soln Commonly known as: DUONEB Take 3 mLs by nebulization every 6 (six) hours as needed.   latanoprost 0.005 % ophthalmic solution Commonly known as: XALATAN Place 1 drop into both eyes daily.   lidocaine 5 % Commonly known as: LIDODERM Place 1 patch onto the skin daily. Remove & Discard patch within 12 hours or as directed by MD   metolazone 5 MG tablet Commonly known as: ZAROXOLYN Take 1 tablet (5 mg total) by mouth every Monday, Wednesday, and Friday. What changed: when to take this   nitroGLYCERIN 0.4 MG SL tablet Commonly known as: NITROSTAT Place 1 tablet (0.4 mg total) under the tongue every 5 (five) minutes as needed for chest pain.   NovoLOG FlexPen 100 UNIT/ML FlexPen Generic drug: insulin aspart Inject 20 Units into the skin 3 (three) times daily with meals. What changed: how much to take   nystatin powder Commonly known as: MYCOSTATIN/NYSTOP Apply 1 Application topically 3 (three) times daily.   omeprazole 40 MG capsule Commonly known as: PRILOSEC Take 1 capsule (40 mg total) by mouth in the morning and at bedtime.   Omnipod 5 G6 Pods (Gen 5) Misc Inject into  the skin daily.   ondansetron 4 MG disintegrating tablet Commonly known as: ZOFRAN-ODT Take 1 tablet (4 mg total) by mouth every 8 (eight) hours as needed for nausea or vomiting.   One-A-Day Womens tablet Take 1 tablet by mouth daily.   prednisoLONE acetate 1 % ophthalmic suspension Commonly known as: PRED FORTE Place 1 drop into both eyes 4 (four) times daily.   promethazine 12.5 MG tablet Commonly known as: PHENERGAN Take 1 tablet (12.5 mg total) by mouth every 6 (six) hours as needed for nausea or vomiting.   Rhopressa 0.02 % Soln Generic drug: Netarsudil Dimesylate Place 1 drop into the left eye daily.   rOPINIRole 1 MG  tablet Commonly known as: REQUIP Take 1 tablet (1 mg total) by mouth at bedtime.   rosuvastatin 10 MG tablet Commonly known as: CRESTOR Take 1 tablet (10 mg total) by mouth daily.   Simbrinza 1-0.2 % Susp Generic drug: Brinzolamide-Brimonidine Place 1 drop into both eyes in the morning, at noon, and at bedtime.   torsemide 100 MG tablet Commonly known as: DEMADEX Take 1 tablet (100 mg total) by mouth daily. What changed: when to take this   traZODone 100 MG tablet Commonly known as: DESYREL TAKE 1 TABLET BY MOUTH AT BEDTIME AS NEEDED FOR SLEEP   Tresiba FlexTouch 200 UNIT/ML FlexTouch Pen Generic drug: insulin degludec Inject 70 Units into the skin 2 (two) times daily.   Vitamin D (Cholecalciferol) 25 MCG (1000 UT) Caps Take 1,000 Units by mouth daily.       Consultations: Nephrology   Procedures/Studies:  MR BRAIN WO CONTRAST  Result Date: 04/27/2023 CLINICAL DATA:  Provided history: Transient ischemic attack (TIA). EXAM: MRI HEAD WITHOUT CONTRAST TECHNIQUE: Multiplanar, multiecho pulse sequences of the brain and surrounding structures were obtained without intravenous contrast. COMPARISON:  Head CT 10/22/2021.  Brain MRI 07/13/2014. FINDINGS: Brain: No age advanced or lobar predominant parenchymal atrophy. There are a few small nonspecific T2 FLAIR hyperintense remote insults scattered within the bilateral cerebral white matter, new from the prior brain MRI of 07/13/2014. No cortical encephalomalacia is identified. There is no acute infarct. No evidence of an intracranial mass. No chronic intracranial blood products. No extra-axial fluid collection. No midline shift. Vascular: Maintained flow voids within the proximal large arterial vessels. Skull and upper cervical spine: No focal suspicious marrow lesion. Sinuses/Orbits: No mass or acute finding within the imaged orbits. 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 Prior bilateral ocular lens replacement. No significant paranasal sinus  disease. IMPRESSION: 1. No evidence of an acute intracranial abnormality. 2. There are a few small nonspecific T2 FLAIR hyperintense remote insults within the cerebral white matter, new from the prior brain MRI of 07/13/2014. 3. Otherwise unremarkable non-contrast MRI appearance of the brain. Electronically Signed   By: Jackey Loge D.O.   On: 04/27/2023 19:25   DG Chest 2 View  Result Date: 04/25/2023 CLINICAL DATA:  Shortness of breath. EXAM: CHEST - 2 VIEW COMPARISON:  04/12/2023. FINDINGS: The heart size and mediastinal contours are within normal limits. Lung volumes are low and pulmonary vascular crowding is noted. No consolidation, effusion, or pneumothorax. Degenerative changes are present in the thoracic spine. No acute osseous abnormality. IMPRESSION: Low lung volumes with no acute process. Electronically Signed   By: Thornell Sartorius M.D.   On: 04/25/2023 22:57   US RENAL  Result Date: 04/12/2023 CLINICAL DATA:  Urinary retention EXAM: RENAL / URINARY TRACT ULTRASOUND COMPLETE COMPARISON:  Renal ultrasound 03/19/2023 FINDINGS: Right Kidney: Renal measurements: 12.5  x 6.0 x 7.4 cm = volume: 292.5 mL. Echogenicity within normal limits. No mass or hydronephrosis visualized. Left Kidney: Renal measurements: 12.6 x 6.3 x 5.5 cm = volume: 230.6 mL. Echogenicity within normal limits. No mass or hydronephrosis visualized. Bladder: Bladder is contracted. Other: Incidental note made of distended gallbladder with stones. Overall study limited by overlapping bowel gas and soft tissue. IMPRESSION: No collecting system dilatation.  Contracted urinary bladder. Incidental note made of gallstones. Electronically Signed   By: Karen Kays M.D.   On: 04/12/2023 17:28   DG Chest 2 View  Result Date: 04/12/2023 CLINICAL DATA:  Six-month history of shortness of breath EXAM: CHEST - 2 VIEW COMPARISON:  Chest radiograph dated 03/19/2023 FINDINGS: Normal lung volumes. No focal consolidations. No pleural effusion or  pneumothorax. The heart size and mediastinal contours are within normal limits. No acute osseous abnormality. IMPRESSION: No active cardiopulmonary disease. Electronically Signed   By: Agustin Cree M.D.   On: 04/12/2023 13:07    Subjective: - no chest pain, shortness of breath, no abdominal pain, nausea or vomiting.   Discharge Exam: BP 135/71   Pulse 96   Temp 98.1 F (36.7 C) (Oral)   Resp 18   Ht 5\' 2"  (1.575 m)   Wt (!) 150 kg   SpO2 100%   BMI 60.48 kg/m   General: Pt is alert, awake, not in acute distress Cardiovascular: RRR, S1/S2 +, no rubs, no gallops Respiratory: CTA bilaterally, no wheezing, no rhonchi  The results of significant diagnostics from this hospitalization (including imaging, microbiology, ancillary and laboratory) are listed below for reference.     Microbiology: Recent Results (from the past 240 hour(s))  Culture, blood (Routine X 2) w Reflex to ID Panel     Status: None (Preliminary result)   Collection Time: 04/26/23  3:21 AM   Specimen: BLOOD  Result Value Ref Range Status   Specimen Description   Final    BLOOD BLOOD LEFT HAND Performed at [facility], [address], [place], [place] [address]    Special Requests   Final    BOTTLES DRAWN AEROBIC AND ANAEROBIC Blood Culture adequate volume Performed at [facility], [address], [place], [place] [address]    Culture   Final    NO GROWTH 3 DAYS Performed at [facility], [address], [place], [place] [address]    Report Status PENDING  Incomplete  Culture, blood (Routine X 2) w Reflex to ID Panel     Status: None (Preliminary result)   Collection Time: 04/26/23  3:21 AM   Specimen: BLOOD  Result Value Ref Range Status   Specimen Description   Final    BLOOD RIGHT ANTECUBITAL Performed at [facility], [address], [place], [place] [address]    Special Requests   Final    BOTTLES DRAWN AEROBIC AND ANAEROBIC Blood Culture  results may not be optimal due to an excessive volume of blood received in culture bottles Performed at [facility], [address] [place], [place] [address]    Culture   Final    NO GROWTH 3 DAYS Performed at [facility], [address], [place], [place] [address]    Report Status PENDING  Incomplete     Labs: Basic Metabolic Panel: Recent Labs  Lab 04/25/23 1856 04/27/23 0439 04/28/23 0426 04/29/23 0434  NA 135 135 136 136  K 3.7 3.2* 3.8 3.6  CL 83*  83* 87* 86*  CO2 36* 38* 36* 38*  GLUCOSE 207* 303* 301* 237*  BUN 129* 133* 128* 120*  CREATININE 2.27* 2.43* 2.29* 2.05*  CALCIUM 9.4 9.7 9.3 9.5  MG  --   --  2.1 2.0   Liver Function Tests: Recent Labs  Lab 04/27/23 0439 04/28/23 0426 04/29/23 0434  AST 20 20 19   ALT 26 24 25   ALKPHOS 118 110 110  BILITOT 0.3 0.5 0.5  PROT 8.8* 8.4* 8.6*  ALBUMIN 3.5 3.3* 3.5   CBC: Recent Labs  Lab 04/25/23 1856 04/27/23 0439 04/28/23 0426 04/29/23 0434  WBC 10.3 8.3 8.9 9.4  NEUTROABS 5.9  --   --   --   HGB 12.8 12.3 11.9* 12.6  HCT 40.9 38.9 38.0 40.5  MCV 89.1 89.6 90.3 90.4  PLT 359 303 300 305   CBG: Recent Labs  Lab 04/28/23 1637 04/28/23 2100 04/29/23 0737 04/29/23 0926 04/29/23 1201  GLUCAP 270* 361* 278* 252* 256*   Hgb A1c No results for input(s): "HGBA1C" in the last 72 hours. Lipid Profile No results for input(s): "CHOL", "HDL", "LDLCALC", "TRIG", "CHOLHDL", "LDLDIRECT" in the last 72 hours. Thyroid function studies No results for input(s): "TSH", "T4TOTAL", "T3FREE", "THYROIDAB" in the last 72 hours.  Invalid input(s): "FREET3" Urinalysis    Component Value Date/Time   COLORURINE YELLOW 04/25/2023 2356   APPEARANCEUR CLOUDY (A) 04/25/2023 2356   LABSPEC 1.011 04/25/2023 2356   PHURINE 6.0 04/25/2023 2356   GLUCOSEU NEGATIVE 04/25/2023 2356   HGBUR NEGATIVE 04/25/2023 2356   BILIRUBINUR NEGATIVE 04/25/2023 2356   BILIRUBINUR negative 04/11/2023 1105    KETONESUR NEGATIVE 04/25/2023 2356   PROTEINUR 100 (A) 04/25/2023 2356   UROBILINOGEN 0.2 04/11/2023 1105   UROBILINOGEN 0.2 08/22/2015 0225   NITRITE NEGATIVE 04/25/2023 2356   LEUKOCYTESUR LARGE (A) 04/25/2023 2356    FURTHER DISCHARGE INSTRUCTIONS:   Get Medicines reviewed and adjusted: Please take all your medications with you for your next visit with your Primary MD   Laboratory/radiological data: Please request your Primary MD to go over all hospital tests and procedure/radiological results at the follow up, please ask your Primary MD to get all Hospital records sent to his/her office.   In some cases, they will be blood work, cultures and biopsy results pending at the time of your discharge. Please request that your primary care M.D. goes through all the records of your hospital data and follows up on these results.   Also Note the following: If you experience worsening of your admission symptoms, develop shortness of breath, life threatening emergency, suicidal or homicidal thoughts you must seek medical attention immediately by calling 911 or calling your MD immediately  if symptoms less severe.   You must read complete instructions/literature along with all the possible adverse reactions/side effects for all the Medicines you take and that have been prescribed to you. Take any new Medicines after you have completely understood and accpet all the possible adverse reactions/side effects.    Do not drive when taking Pain medications or sleeping medications (Benzodaizepines)   Do not take more than prescribed Pain, Sleep and Anxiety Medications. It is not advisable to combine anxiety,sleep and pain medications without talking with your primary care practitioner   Special Instructions: If you have smoked or chewed Tobacco  in the last 2 yrs please stop smoking, stop any regular Alcohol  and or any Recreational drug use.   Wear Seat belts while driving.   Please note: You were  cared for by a hospitalist during your hospital stay. Once you are discharged, your primary care physician will handle any further medical issues. Please note that NO REFILLS for any discharge medications will be authorized once you are discharged, as it is imperative that you return to your primary care physician (or establish a relationship with a primary care physician if you do not have one) for your post hospital discharge needs so that they can reassess your need for medications and monitor your lab values.  Time coordinating discharge: 35 minutes  SIGNED:  Pamella Pert, MD, PhD 04/29/2023, 12:07 PM

## 2023-04-29 NOTE — Progress Notes (Signed)
Patient ID: [name], female   DOB: May 06, 1976, 47 y.o.   MRN: [id number] [facility] Progress Note   Assessment/ Plan:   1.  Acute kidney injury on chronic kidney disease stage IIIb: With stepwise progression of underlying chronic kidney disease after suffering episodes of acute kidney injury.  Current acute injury suspected to be hemodynamically mediated with decreased effective arterial blood volume/diuretics with renal hypoperfusion.  She reports that her tremors are slightly better and labs indicate improving BUN/creatinine with reduction of diuretic dose; she is stable enough to discharge home on torsemide 100 mg daily and metolazone 5 mg 3 days a week to follow-up with nephrology in 1 to 2 weeks (the office has been trying to call her this morning but she did not answer). 2.  Tremors: She apparently has a mild underlying/baseline chronic tremor that was exacerbated over the last few weeks; likely worsened by gabapentin toxicity in the setting of worsening renal function as well as azotemia.  Gabapentin dose decreased and will continue to monitor symptoms/renal function. 3.  Urinary tract infection: Suspected in the setting of pyuria seen on initial urinalysis and cultures pending.  On empiric antimicrobial therapy with ceftriaxone with negative urine and blood cultures so far; course completed. 4.  Chronic hypoxic respiratory failure: Continue oxygen supplementation per her outpatient prescription with nocturnal BiPAP for OHS/OSA. 5.  Hypertension: Blood pressure currently acceptable; indeed may have a component of challenges with management given her habitus.  Diuretics decreased. 6.  Hypokalemia: Corrected via intravenous route after patient discontinued oral supplement.  Likely secondary to diuresis.   Subjective:   Any acute events overnight, catching up with some sleep deficit   Objective:   BP 135/71   Pulse 96   Temp 98.1 F (36.7 C) (Oral)   Resp 18   Ht 5\' 2"   (1.575 m)   Wt (!) 150 kg   SpO2 100%   BMI 60.48 kg/m   Intake/Output Summary (Last 24 hours) at 04/29/2023 1308 Last data filed at 04/29/2023 1037 Gross per 24 hour  Intake 1020 ml  Output 3000 ml  Net -1980 ml   Weight change:   Physical Exam: Gen: Comfortably sleeping on BiPAP, easy to awaken and engage in conversation CVS: Pulse regular rhythm, normal rate, S1 and S2 normal Resp: Diminished breath sounds over bases, no distinct rales or rhonchi Abd: Soft, obese, nontender, bowel sounds normal Ext: Status post left BKA and right TMA, 1+ right lower extremity edema  Imaging: MR BRAIN WO CONTRAST  Result Date: 04/27/2023 CLINICAL DATA:  Provided history: Transient ischemic attack (TIA). EXAM: MRI HEAD WITHOUT CONTRAST TECHNIQUE: Multiplanar, multiecho pulse sequences of the brain and surrounding structures were obtained without intravenous contrast. COMPARISON:  Head CT 10/22/2021.  Brain MRI 07/13/2014. FINDINGS: Brain: No age advanced or lobar predominant parenchymal atrophy. There are a few small nonspecific T2 FLAIR hyperintense remote insults scattered within the bilateral cerebral white matter, new from the prior brain MRI of 07/13/2014. No cortical encephalomalacia is identified. There is no acute infarct. No evidence of an intracranial mass. No chronic intracranial blood products. No extra-axial fluid collection. No midline shift. Vascular: Maintained flow voids within the proximal large arterial vessels. Skull and upper cervical spine: No focal suspicious marrow lesion. Sinuses/Orbits: No mass or acute finding within the imaged orbits. 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 Prior bilateral ocular lens replacement. No significant paranasal sinus disease. IMPRESSION: 1. No evidence of an acute intracranial abnormality. 2. There are a few small nonspecific T2  FLAIR hyperintense remote insults within the cerebral white matter, new from the prior brain MRI of 07/13/2014. 3. Otherwise unremarkable  non-contrast MRI appearance of the brain. Electronically Signed   By: Jackey Loge D.O.   On: 04/27/2023 19:25    Labs: BMET Recent Labs  Lab 04/25/23 1856 04/27/23 0439 04/28/23 0426 04/29/23 0434  NA 135 135 136 136  K 3.7 3.2* 3.8 3.6  CL 83* 83* 87* 86*  CO2 36* 38* 36* 38*  GLUCOSE 207* 303* 301* 237*  BUN 129* 133* 128* 120*  CREATININE 2.27* 2.43* 2.29* 2.05*  CALCIUM 9.4 9.7 9.3 9.5   CBC Recent Labs  Lab 04/25/23 1856 04/27/23 0439 04/28/23 0426 04/29/23 0434  WBC 10.3 8.3 8.9 9.4  NEUTROABS 5.9  --   --   --   HGB 12.8 12.3 11.9* 12.6  HCT 40.9 38.9 38.0 40.5  MCV 89.1 89.6 90.3 90.4  PLT 359 303 300 305    Medications:     amLODipine  10 mg Oral Daily   aspirin EC  81 mg Oral Daily   Brinzolamide-Brimonidine  1 drop Both Eyes TID   cholecalciferol  1,000 Units Oral Daily   diclofenac Sodium  2 g Topical QID   DULoxetine  120 mg Oral Daily   gabapentin  300 mg Oral QHS   heparin  5,000 Units Subcutaneous Q8H   insulin aspart  0-15 Units Subcutaneous TID WC   insulin aspart  0-5 Units Subcutaneous QHS   insulin aspart  6 Units Subcutaneous TID WC   insulin glargine-yfgn  70 Units Subcutaneous BID   latanoprost  1 drop Both Eyes QHS   lidocaine  1 patch Transdermal Q24H   Netarsudil Dimesylate  1 drop Left Eye QHS   pantoprazole  40 mg Oral Daily   prednisoLONE acetate  1 drop Both Eyes Q6H   rOPINIRole  1 mg Oral QHS   rosuvastatin  10 mg Oral Daily   torsemide  100 mg Oral Daily    Zetta Bills, MD 04/29/2023, 1:08 PM

## 2023-04-29 NOTE — Plan of Care (Signed)
Problem: Education: Goal: Ability to describe self-care measures that may prevent or decrease complications (Diabetes Survival Skills Education) will improve 04/29/2023 1153 by Brandon Melnick, RN Outcome: Completed/Met 04/29/2023 1152 by Brandon Melnick, RN Outcome: Adequate for Discharge Goal: Individualized Educational Video(s) 04/29/2023 1153 by Brandon Melnick, RN Outcome: Completed/Met 04/29/2023 1152 by Brandon Melnick, RN Outcome: Adequate for Discharge   Problem: Coping: Goal: Ability to adjust to condition or change in health will improve 04/29/2023 1153 by Brandon Melnick, RN Outcome: Completed/Met 04/29/2023 1152 by Brandon Melnick, RN Outcome: Adequate for Discharge   Problem: Fluid Volume: Goal: Ability to maintain a balanced intake and output will improve 04/29/2023 1153 by Brandon Melnick, RN Outcome: Completed/Met 04/29/2023 1152 by Brandon Melnick, RN Outcome: Adequate for Discharge   Problem: Health Behavior/Discharge Planning: Goal: Ability to identify and utilize available resources and services will improve 04/29/2023 1153 by Brandon Melnick, RN Outcome: Completed/Met 04/29/2023 1152 by Brandon Melnick, RN Outcome: Adequate for Discharge Goal: Ability to manage health-related needs will improve 04/29/2023 1153 by Brandon Melnick, RN Outcome: Completed/Met 04/29/2023 1152 by Brandon Melnick, RN Outcome: Adequate for Discharge   Problem: Metabolic: Goal: Ability to maintain appropriate glucose levels will improve 04/29/2023 1153 by Brandon Melnick, RN Outcome: Completed/Met 04/29/2023 1152 by Brandon Melnick, RN Outcome: Adequate for Discharge   Problem: Nutritional: Goal: Maintenance of adequate nutrition will improve 04/29/2023 1153 by Brandon Melnick, RN Outcome: Completed/Met 04/29/2023 1152 by Brandon Melnick, RN Outcome: Adequate for Discharge Goal: Progress toward achieving an optimal  weight will improve 04/29/2023 1153 by Brandon Melnick, RN Outcome: Completed/Met 04/29/2023 1152 by Brandon Melnick, RN Outcome: Adequate for Discharge   Problem: Skin Integrity: Goal: Risk for impaired skin integrity will decrease 04/29/2023 1153 by Brandon Melnick, RN Outcome: Completed/Met 04/29/2023 1152 by Brandon Melnick, RN Outcome: Adequate for Discharge   Problem: Tissue Perfusion: Goal: Adequacy of tissue perfusion will improve 04/29/2023 1153 by Brandon Melnick, RN Outcome: Completed/Met 04/29/2023 1152 by Brandon Melnick, RN Outcome: Adequate for Discharge   Problem: Education: Goal: Knowledge of General Education information will improve Description: Including pain rating scale, medication(s)/side effects and non-pharmacologic comfort measures 04/29/2023 1153 by Brandon Melnick, RN Outcome: Completed/Met 04/29/2023 1152 by Brandon Melnick, RN Outcome: Adequate for Discharge   Problem: Health Behavior/Discharge Planning: Goal: Ability to manage health-related needs will improve 04/29/2023 1153 by Brandon Melnick, RN Outcome: Completed/Met 04/29/2023 1152 by Brandon Melnick, RN Outcome: Adequate for Discharge   Problem: Clinical Measurements: Goal: Ability to maintain clinical measurements within normal limits will improve 04/29/2023 1153 by Brandon Melnick, RN Outcome: Completed/Met 04/29/2023 1152 by Brandon Melnick, RN Outcome: Adequate for Discharge Goal: Will remain free from infection 04/29/2023 1153 by Brandon Melnick, RN Outcome: Completed/Met 04/29/2023 1152 by Brandon Melnick, RN Outcome: Adequate for Discharge Goal: Diagnostic test results will improve 04/29/2023 1153 by Brandon Melnick, RN Outcome: Completed/Met 04/29/2023 1152 by Brandon Melnick, RN Outcome: Adequate for Discharge Goal: Respiratory complications will improve 04/29/2023 1153 by Brandon Melnick, RN Outcome:  Completed/Met 04/29/2023 1152 by Brandon Melnick, RN Outcome: Adequate for Discharge Goal: Cardiovascular complication will be avoided 04/29/2023 1153 by Brandon Melnick, RN Outcome: Completed/Met 04/29/2023 1152 by Brandon Melnick, RN Outcome: Adequate for Discharge   Problem: Activity: Goal: Risk for activity intolerance will decrease 04/29/2023 1153 by Brandon Melnick, RN Outcome: Completed/Met 04/29/2023 1152  by Brandon Melnick, RN Outcome: Adequate for Discharge   Problem: Nutrition: Goal: Adequate nutrition will be maintained 04/29/2023 1153 by Brandon Melnick, RN Outcome: Completed/Met 04/29/2023 1152 by Brandon Melnick, RN Outcome: Adequate for Discharge   Problem: Coping: Goal: Level of anxiety will decrease 04/29/2023 1153 by Brandon Melnick, RN Outcome: Completed/Met 04/29/2023 1152 by Brandon Melnick, RN Outcome: Adequate for Discharge   Problem: Elimination: Goal: Will not experience complications related to bowel motility 04/29/2023 1153 by Brandon Melnick, RN Outcome: Completed/Met 04/29/2023 1152 by Brandon Melnick, RN Outcome: Adequate for Discharge Goal: Will not experience complications related to urinary retention 04/29/2023 1153 by Brandon Melnick, RN Outcome: Completed/Met 04/29/2023 1152 by Brandon Melnick, RN Outcome: Adequate for Discharge   Problem: Pain Managment: Goal: General experience of comfort will improve 04/29/2023 1153 by Brandon Melnick, RN Outcome: Completed/Met 04/29/2023 1152 by Brandon Melnick, RN Outcome: Adequate for Discharge   Problem: Safety: Goal: Ability to remain free from injury will improve 04/29/2023 1153 by Brandon Melnick, RN Outcome: Completed/Met 04/29/2023 1152 by Brandon Melnick, RN Outcome: Adequate for Discharge   Problem: Skin Integrity: Goal: Risk for impaired skin integrity will decrease 04/29/2023 1153 by Brandon Melnick, RN Outcome:  Completed/Met 04/29/2023 1152 by Brandon Melnick, RN Outcome: Adequate for Discharge

## 2023-04-29 NOTE — Progress Notes (Signed)
Patient received discharge orders to go home. Patient was given discharge paperwork/instructions. RN went over discharge paperwork/instructions with the patient. All questions or concerns were answered and addressed. Patient needing [facility]AR for transportation. RN reached out to [facility] to arrange PTAR transportation. PTAR was called.   At shift change, patient is still waiting on PTAR. Gave report to night shift RN about patient until Sharin Mons arrives.

## 2023-04-29 NOTE — Progress Notes (Signed)
   04/28/23 2356  BiPAP/CPAP/SIPAP  BiPAP/CPAP/SIPAP Pt Type Adult  BiPAP/CPAP/SIPAP DREAMSTATIOND  Mask Type Full face mask  Mask Size Medium  Respiratory Rate 18 breaths/min  Flow Rate 4 lpm  Patient Home Equipment No  Auto Titrate  (imax20 EMin8 PS 4-8)

## 2023-04-29 NOTE — Progress Notes (Signed)
Patient is alert and orientated x4. She declined to have assessment. She has demanded to be assisted downstairs where she says a ride is waiting for her because she states that she can no longer wait for PTAR. Patient exited unit via wheelchair with all belongings in no distress assisted by NT. PTAR transport cancelled.

## 2023-04-30 LAB — CULTURE, BLOOD (ROUTINE X 2)

## 2023-05-01 LAB — CULTURE, BLOOD (ROUTINE X 2)

## 2023-05-02 ENCOUNTER — Telehealth: Payer: Self-pay

## 2023-05-02 DIAGNOSIS — R55 Syncope and collapse: Secondary | ICD-10-CM | POA: Diagnosis not present

## 2023-05-02 DIAGNOSIS — R569 Unspecified convulsions: Secondary | ICD-10-CM | POA: Diagnosis not present

## 2023-05-02 DIAGNOSIS — Z743 Need for continuous supervision: Secondary | ICD-10-CM | POA: Diagnosis not present

## 2023-05-02 DIAGNOSIS — R42 Dizziness and giddiness: Secondary | ICD-10-CM | POA: Diagnosis not present

## 2023-05-02 DIAGNOSIS — R739 Hyperglycemia, unspecified: Secondary | ICD-10-CM | POA: Diagnosis not present

## 2023-05-02 NOTE — Transitions of Care (Post Inpatient/ED Visit) (Unsigned)
   05/02/2023  Name: [name] MRN: [id number] DOB: 1976/05/08  Today's TOC FU Call Status: Today's TOC FU Call Status:: Unsuccessul Call (1st Attempt) Unsuccessful Call (1st Attempt) Date: 05/02/23  Attempted to reach the patient regarding the most recent Inpatient/ED visit.  Follow Up Plan: Additional outreach attempts will be made to reach the patient to complete the Transitions of Care (Post Inpatient/ED visit) call.   Signature Karena Addison, LPN [facility] Nurse Health Advisor Direct Dial [phone]

## 2023-05-03 ENCOUNTER — Telehealth: Payer: Self-pay | Admitting: Licensed Clinical Social Worker

## 2023-05-03 NOTE — Telephone Encounter (Signed)
H&V Care Navigation CSW Progress Note  Clinical Social Worker contacted patient by phone to f/u on upcoming TOC appt. Pt was called, didn't answer so voicemail was left with reminder at [phone]. I also sent pt a text message with Eileen Stanford, LCSW at HF clinic reminding pt that she has appt tomorrow and those details, also offered transportation. Remain available should pt need assistance with getting to appt.   Patient is participating in a Managed Medicaid Plan:  No, unmanaged Medicaid  SDOH Screenings   Food Insecurity: No Food Insecurity (04/27/2023)  Housing: Medium Risk (04/27/2023)  Transportation Needs: No Transportation Needs (04/27/2023)  Utilities: Not At Risk (04/27/2023)  Alcohol Screen: Low Risk  (04/14/2023)  Depression (PHQ2-9): Medium Risk (03/07/2023)  Financial Resource Strain: High Risk (04/15/2023)  Stress: Stress Concern Present (02/19/2021)  Tobacco Use: Medium Risk (04/26/2023)   Octavio Graves, MSW, LCSW Clinical Social Worker [facility] Heart/Vascular Care Navigation  [phone]- work cell phone (preferred) [phone]- desk phone

## 2023-05-03 NOTE — Transitions of Care (Post Inpatient/ED Visit) (Unsigned)
   05/03/2023  Name: [name] MRN: [id number] DOB: 1976-02-02  Today's TOC FU Call Status: Today's TOC FU Call Status:: Unsuccessful Call (2nd Attempt) Unsuccessful Call (1st Attempt) Date: 05/02/23 Unsuccessful Call (2nd Attempt) Date: 05/03/23  Attempted to reach the patient regarding the most recent Inpatient/ED visit.  Follow Up Plan: Additional outreach attempts will be made to reach the patient to complete the Transitions of Care (Post Inpatient/ED visit) call.   Signature Karena Addison, LPN [facility] Nurse Health Advisor Direct Dial [phone]

## 2023-05-04 ENCOUNTER — Ambulatory Visit (HOSPITAL_COMMUNITY)
Admit: 2023-05-04 | Discharge: 2023-05-04 | Disposition: A | Discharge status: Home / Self Care | Payer: 59 | Attending: Cardiology | Admitting: Cardiology

## 2023-05-04 ENCOUNTER — Encounter (HOSPITAL_COMMUNITY): Payer: Self-pay

## 2023-05-04 VITALS — BP 147/88 | HR 99

## 2023-05-04 DIAGNOSIS — E1165 Type 2 diabetes mellitus with hyperglycemia: Secondary | ICD-10-CM | POA: Diagnosis not present

## 2023-05-04 DIAGNOSIS — G4733 Obstructive sleep apnea (adult) (pediatric): Secondary | ICD-10-CM | POA: Insufficient documentation

## 2023-05-04 DIAGNOSIS — Z794 Long term (current) use of insulin: Secondary | ICD-10-CM | POA: Diagnosis not present

## 2023-05-04 DIAGNOSIS — N1832 Chronic kidney disease, stage 3b: Secondary | ICD-10-CM | POA: Diagnosis not present

## 2023-05-04 DIAGNOSIS — Z79899 Other long term (current) drug therapy: Secondary | ICD-10-CM | POA: Diagnosis not present

## 2023-05-04 DIAGNOSIS — Z89431 Acquired absence of right foot: Secondary | ICD-10-CM | POA: Insufficient documentation

## 2023-05-04 DIAGNOSIS — I5032 Chronic diastolic (congestive) heart failure: Secondary | ICD-10-CM | POA: Diagnosis not present

## 2023-05-04 DIAGNOSIS — Z89512 Acquired absence of left leg below knee: Secondary | ICD-10-CM | POA: Insufficient documentation

## 2023-05-04 DIAGNOSIS — Z7951 Long term (current) use of inhaled steroids: Secondary | ICD-10-CM | POA: Insufficient documentation

## 2023-05-04 DIAGNOSIS — R000 Tachycardia, unspecified: Secondary | ICD-10-CM | POA: Insufficient documentation

## 2023-05-04 DIAGNOSIS — J9611 Chronic respiratory failure with hypoxia: Secondary | ICD-10-CM | POA: Insufficient documentation

## 2023-05-04 DIAGNOSIS — I130 Hypertensive heart and chronic kidney disease with heart failure and stage 1 through stage 4 chronic kidney disease, or unspecified chronic kidney disease: Secondary | ICD-10-CM | POA: Insufficient documentation

## 2023-05-04 DIAGNOSIS — Z5986 Financial insecurity: Secondary | ICD-10-CM | POA: Diagnosis not present

## 2023-05-04 DIAGNOSIS — Z9981 Dependence on supplemental oxygen: Secondary | ICD-10-CM | POA: Diagnosis not present

## 2023-05-04 DIAGNOSIS — E1122 Type 2 diabetes mellitus with diabetic chronic kidney disease: Secondary | ICD-10-CM | POA: Insufficient documentation

## 2023-05-04 DIAGNOSIS — E1151 Type 2 diabetes mellitus with diabetic peripheral angiopathy without gangrene: Secondary | ICD-10-CM | POA: Insufficient documentation

## 2023-05-04 LAB — BASIC METABOLIC PANEL
Anion gap: 16 — ABNORMAL HIGH (ref 5–15)
BUN: 110 mg/dL — ABNORMAL HIGH (ref 6–20)
CO2: 33 mmol/L — ABNORMAL HIGH (ref 22–32)
Calcium: 9.4 mg/dL (ref 8.9–10.3)
Chloride: 87 mmol/L — ABNORMAL LOW (ref 98–111)
Creatinine, Ser: 2.24 mg/dL — ABNORMAL HIGH (ref 0.44–1.00)
GFR, Estimated: 27 mL/min — ABNORMAL LOW (ref >60)
Glucose, Bld: 326 mg/dL — ABNORMAL HIGH (ref 70–99)
Potassium: 3.7 mmol/L (ref 3.5–5.1)
Sodium: 136 mmol/L (ref 135–145)

## 2023-05-04 LAB — CBC
HCT: 36 % (ref 36.0–46.0)
Hemoglobin: 11.6 g/dL — ABNORMAL LOW (ref 12.0–15.0)
MCH: 28.4 pg (ref 26.0–34.0)
MCHC: 32.2 g/dL (ref 30.0–36.0)
MCV: 88 fL (ref 80.0–100.0)
Platelets: 265 10*3/uL (ref 150–400)
RBC: 4.09 MIL/uL (ref 3.87–5.11)
RDW: 15.9 % — ABNORMAL HIGH (ref 11.5–15.5)
WBC: 8.9 10*3/uL (ref 4.0–10.5)
nRBC: 0 % (ref 0.0–0.2)

## 2023-05-04 NOTE — Transitions of Care (Post Inpatient/ED Visit) (Signed)
   05/04/2023  Name: [name] MRN: [id number] DOB: 10/27/1976  Today's TOC FU Call Status: Today's TOC FU Call Status:: Unsuccessful Call (3rd Attempt) Unsuccessful Call (1st Attempt) Date: 05/02/23 Unsuccessful Call (2nd Attempt) Date: 05/03/23 Unsuccessful Call (3rd Attempt) Date: 05/04/23  Attempted to reach the patient regarding the most recent Inpatient/ED visit.  Follow Up Plan: No further outreach attempts will be made at this time. We have been unable to contact the patient.  Signature  Karena Addison, LPN [facility] Nurse Health Advisor Direct Dial [phone]

## 2023-05-04 NOTE — Progress Notes (Signed)
HEART & VASCULAR TRANSITION OF CARE CONSULT NOTE     Referring Physician: Primary Care: Primary Cardiologist:  HPI: Referred to clinic by *** for heart failure consultation.   Cardiac Testing    Review of Systems: [y] = yes, [ ]  = no   General: Weight gain [ ] ; Weight loss [ ] ; Anorexia [ ] ; Fatigue [ ] ; Fever [ ] ; Chills [ ] ; Weakness [ ]   Cardiac: Chest pain/pressure [ ] ; Resting SOB [ ] ; Exertional SOB [ ] ; Orthopnea [ ] ; Pedal Edema [ ] ; Palpitations [ ] ; Syncope [ ] ; Presyncope [ ] ; Paroxysmal nocturnal dyspnea[ ]   Pulmonary: Cough [ ] ; Wheezing[ ] ; Hemoptysis[ ] ; Sputum [ ] ; Snoring [ ]   GI: Vomiting[ ] ; Dysphagia[ ] ; Melena[ ] ; Hematochezia [ ] ; Heartburn[ ] ; Abdominal pain [ ] ; Constipation [ ] ; Diarrhea [ ] ; BRBPR [ ]   GU: Hematuria[ ] ; Dysuria [ ] ; Nocturia[ ]   Vascular: Pain in legs with walking [ ] ; Pain in feet with lying flat [ ] ; Non-healing sores [ ] ; Stroke [ ] ; TIA [ ] ; Slurred speech [ ] ;  Neuro: Headaches[ ] ; Vertigo[ ] ; Seizures[ ] ; Paresthesias[ ] ;Blurred vision [ ] ; Diplopia [ ] ; Vision changes [ ]   Ortho/Skin: Arthritis [ ] ; Joint pain [ ] ; Muscle pain [ ] ; Joint swelling [ ] ; Back Pain [ ] ; Rash [ ]   Psych: Depression[ ] ; Anxiety[ ]   Heme: Bleeding problems [ ] ; Clotting disorders [ ] ; Anemia [ ]   Endocrine: Diabetes [ ] ; Thyroid dysfunction[ ]    Past Medical History:  Diagnosis Date   Abscess of groin, left    Acute on chronic diastolic (congestive) heart failure ([name]) 02/05/2022   Acute on chronic respiratory failure with hypoxia ([name]) 04/11/2022   Acute osteomyelitis of ankle or foot, left ([name]) 01/31/2018   Acute respiratory failure with hypoxia and hypercarbia ([name]) 06/15/2020   Acute tubular necrosis ([name]) 07/12/2022   Alveolar hypoventilation    Amputation stump infection ([name]) 04/09/2018   Anemia    not on iron pill   Asthma    Bipolar 2 disorder ([name])    Candidiasis of vagina 05/15/2022   Carpal tunnel syndrome on right    recurrent    Cellulitis 08/2010-08/2011   Chest pain with low risk for cardiac etiology 05/11/2021   Chronic pain    COPD (chronic obstructive pulmonary disease) ([name])    Symbicort daily and Proventil as needed   Costochondritis    De Quervain's tenosynovitis, bilateral 11/01/2015   Depression    Diabetes mellitus type II, uncontrolled 2000   Type 2, Uncontrolled.Takes Lantus daily.Fasting blood sugar runs 150   Diabetic ulcer of right foot ([name]) 12/19/2021   Drug-seeking behavior    Elevated troponin    Exposure to mold 04/21/2020   Family history of metabolic acidosis with increased anion gap 04/12/2023   GERD (gastroesophageal reflux disease)    takes Pantoprazole and Zantac daily   HLD (hyperlipidemia)    takes Atorvastatin daily   Hypertension    takes Lisinopril and Coreg daily   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  Mild diffuse proximal LAD stenosis with no evidence of obstructive disease (20 to 30% diffuse stenosis);  2.  Left dominant circumflex with widely patent obtuse  marginal branches and left PDA branch, no significant stenoses present  3.  Nondominant RCA with mild diffuse nonobstructive stenosis    OSA on CPAP    Peripheral neuropathy    takes Gabapentin daily   Pneumonia    "walking" several yrs ago and as a baby (12/05/2018)   Pneumonia due to COVID-19 virus 04/14/2021   Primary osteoarthritis of first carpometacarpal joint of left hand 07/30/2016   Rectal fissure    Restless leg    Right carpal tunnel syndrome 09/01/2011   SVT (supraventricular tachycardia)    Syncope 02/25/2016   Thrombocytosis 04/11/2022   Urinary frequency    Urinary incontinence 10/23/2020   UTI (urinary tract infection) 04/15/2021    Varicose veins    Right medial thigh and Left leg     Current Outpatient Medications  Medication Sig Dispense Refill   acetaminophen (TYLENOL) 325 MG tablet Take 1-2 tablets (325-650 mg total) by mouth every 4 (four) hours as needed for mild pain.     albuterol (VENTOLIN HFA) 108 (90 Base) MCG/ACT inhaler Inhale 2 puffs into the lungs every 6 (six) hours as needed for wheezing or shortness of breath. (Patient not taking: Reported on 04/26/2023) 18 g 4   amLODipine (NORVASC) 10 MG tablet Take 1 tablet (10 mg total) by mouth daily. 90 tablet 3   aspirin EC 81 MG tablet Take 1 tablet (81 mg total) by mouth daily. Swallow whole. 90 tablet 3   Brinzolamide-Brimonidine (SIMBRINZA) 1-0.2 % SUSP Place 1 drop into both eyes in the morning, at noon, and at bedtime.     budesonide-formoterol (SYMBICORT) 160-4.5 MCG/ACT inhaler Inhale 2 puffs into the lungs 2 (two) times daily. (Patient not taking: Reported on 04/26/2023) 10.3 g 4   cetirizine (ZYRTEC) 10 MG tablet Take 1 tablet (10 mg total) by mouth daily as needed for allergies. 30 tablet 3   Continuous Blood Gluc Sensor (DEXCOM G6 SENSOR) MISC Inject 1 applicator into the skin as directed. Change sensor every 10 days. (Patient not taking: Reported on 04/26/2023) 3 each 11   Continuous Blood Gluc Transmit (DEXCOM G6 TRANSMITTER) MISC Inject 1 Device into the skin as directed. Reuse 8 times with sensor changes. (Patient not taking: Reported on 04/26/2023) 1 each 3   diclofenac Sodium (VOLTAREN) 1 % GEL Apply 2 g topically 4 (four) times daily. 150 g 0   DULoxetine (CYMBALTA) 60 MG capsule TAKE 2 CAPSULES BY MOUTH DAILY 60 capsule 1   gabapentin (NEURONTIN) 300 MG capsule Take 2 capsules (600 mg total) by mouth at bedtime. 60 capsule 0   GNP ULTICARE PEN NEEDLES 32G X 4 MM MISC USE TO inject insulin 2 TIMES DAILY (Patient not taking: Reported on 04/26/2023) 100 each 2   insulin aspart (NOVOLOG FLEXPEN) 100 UNIT/ML FlexPen Inject 20 Units into the skin 3 (three)  times daily with meals. (Patient taking differently: Inject 45 Units into the skin 3 (three) times daily with meals.) 45 mL 3   insulin degludec (TRESIBA FLEXTOUCH) 200 UNIT/ML FlexTouch Pen Inject 70 Units into the skin 2 (two) times daily. 30 mL 3   Insulin Disposable Pump (OMNIPOD 5 G6 POD, GEN 5,) MISC Inject into the skin daily.     ipratropium-albuterol (DUONEB) 0.5-2.5 (3) MG/3ML SOLN Take 3 mLs by nebulization every 6 (six) hours as needed. (Patient not taking: Reported on 04/26/2023) 360 mL 11   latanoprost (XALATAN) 0.005 % ophthalmic solution Place 1 drop into both eyes daily.     lidocaine (LIDODERM) 5 %  Place 1 patch onto the skin daily. Remove & Discard patch within 12 hours or as directed by MD 30 patch 0   metolazone (ZAROXOLYN) 5 MG tablet Take 1 tablet (5 mg total) by mouth every Monday, Wednesday, and Friday. 30 tablet 3   Multiple Vitamins-Minerals (ONE-A-DAY WOMENS) tablet Take 1 tablet by mouth daily.     nitroGLYCERIN (NITROSTAT) 0.4 MG SL tablet Place 1 tablet (0.4 mg total) under the tongue every 5 (five) minutes as needed for chest pain. (Patient not taking: Reported on 04/26/2023) 30 tablet 2   nystatin (MYCOSTATIN/NYSTOP) powder Apply 1 Application topically 3 (three) times daily. 30 g 0   omeprazole (PRILOSEC) 40 MG capsule Take 1 capsule (40 mg total) by mouth in the morning and at bedtime. 60 capsule 11   ondansetron (ZOFRAN-ODT) 4 MG disintegrating tablet Take 1 tablet (4 mg total) by mouth every 8 (eight) hours as needed for nausea or vomiting. 20 tablet 0   prednisoLONE acetate (PRED FORTE) 1 % ophthalmic suspension Place 1 drop into both eyes 4 (four) times daily.     promethazine (PHENERGAN) 12.5 MG tablet Take 1 tablet (12.5 mg total) by mouth every 6 (six) hours as needed for nausea or vomiting. 20 tablet 0   RHOPRESSA 0.02 % SOLN Place 1 drop into the left eye daily.     rOPINIRole (REQUIP) 1 MG tablet Take 1 tablet (1 mg total) by mouth at bedtime. 90 tablet 1    rosuvastatin (CRESTOR) 10 MG tablet Take 1 tablet (10 mg total) by mouth daily. 30 tablet 0   torsemide (DEMADEX) 100 MG tablet Take 1 tablet (100 mg total) by mouth daily. 30 tablet 0   traZODone (DESYREL) 100 MG tablet TAKE 1 TABLET BY MOUTH AT BEDTIME AS NEEDED FOR SLEEP 90 tablet 1   Vitamin D, Cholecalciferol, 25 MCG (1000 UT) CAPS Take 1,000 Units by mouth daily. 90 capsule 3   No current facility-administered medications for this visit.    Allergies  Allergen Reactions   Cefepime Other (See Comments)    AKI, see records from [facility] hospitalization in January 2020.   Other reaction(s): Other (See Comments) "Shut down organs/kidneys"  Note pt has tolerated Rocephin and Keflex Other reaction(s): Not available   Iodine Other (See Comments)    Kidney dysfunction   Kiwi Extract Shortness Of Breath, Swelling and Anaphylaxis    Other reaction(s): Not available   Morphine And Codeine Nausea And Vomiting   Pentoxifylline Nausea And Vomiting    Trental   Cephalosporins Other (See Comments)    Pt states that they cause her kidneys to shut down   Toradol [Ketorolac Tromethamine] Other (See Comments)    Feels like something is crawling on me   Nubain [Nalbuphine Hcl] Other (See Comments)    "FEELS LIKE SOMETHING CRAWLING ON ME" Hallucinations       Social History   Socioeconomic History   Marital status: Divorced    Spouse name: Not on file   Number of children: 1   Years of education: Not on file   Highest education level: 10th grade  Occupational History    Comment: SSI  Tobacco Use   Smoking status: Former    Packs/day: 0.25    Years: 15.00    Additional pack years: 0.00    Total pack years: 3.75    Types: Cigarettes    Quit date: 12/06/2005    Years since quitting: 17.4   Smokeless tobacco: Never   Tobacco comments:  smokes for a couple of months  Vaping Use   Vaping Use: Never used  Substance and Sexual Activity   Alcohol use: Not Currently   Drug use: Yes     Types: "Crack" cocaine    Comment: do when stressed out and in pain.   Sexual activity: Yes    Partners: Male  Other Topics Concern   Not on file  Social History Narrative   Lives in [place] with her fiance and 45 yr old dtr.   Social Determinants of Health   Financial Resource Strain: High Risk (04/15/2023)   Overall Financial Resource Strain (CARDIA)    Difficulty of Paying Living Expenses: Hard  Food Insecurity: No Food Insecurity (04/27/2023)   Hunger Vital Sign    Worried About Running Out of Food in the Last Year: Never true    Ran Out of Food in the Last Year: Never true  Transportation Needs: No Transportation Needs (04/27/2023)   PRAPARE - Administrator, Civil Service (Medical): No    Lack of Transportation (Non-Medical): No  Physical Activity: Not on file  Stress: Stress Concern Present (02/19/2021)   [employer] of Occupational Health - Occupational Stress Questionnaire    Feeling of Stress : Very much  Social Connections: Not on file  Intimate Partner Violence: Not At Risk (04/27/2023)   Humiliation, Afraid, Rape, and Kick questionnaire    Fear of Current or Ex-Partner: No    Emotionally Abused: No    Physically Abused: No    Sexually Abused: No      Family History  Problem Relation Age of Onset   Diabetes Mother    Hyperlipidemia Mother    Depression Mother    GER disease Mother    Allergic rhinitis Mother    Restless legs syndrome Mother    Heart attack Paternal Uncle    Heart disease Paternal Grandmother    Heart attack Paternal Grandmother    Heart attack Paternal Grandfather    Heart disease Paternal Grandfather    Heart attack Father    Migraines Sister    Cancer Maternal Grandmother        COLON   Hypertension Maternal Grandmother    Hyperlipidemia Maternal Grandmother    Diabetes Maternal Grandmother    Other Maternal Grandfather        GUN SHOT   Anxiety disorder Sister    Asthma Child     There were no vitals filed for this  visit.  PHYSICAL EXAM: General:  Well appearing. No respiratory difficulty HEENT: normal Neck: supple. no JVD. Carotids 2+ bilat; no bruits. No lymphadenopathy or thryomegaly appreciated. Cor: PMI nondisplaced. Regular rate & rhythm. No rubs, gallops or murmurs. Lungs: clear Abdomen: soft, nontender, nondistended. No hepatosplenomegaly. No bruits or masses. Good bowel sounds. Extremities: no cyanosis, clubbing, rash, edema Neuro: alert & oriented x 3, cranial nerves grossly intact. moves all 4 extremities w/o difficulty. Affect pleasant.  ECG:   ASSESSMENT & PLAN:  NYHA *** GDMT  Diuretic- BB- Ace/ARB/ARNI MRA SGLT2i    Referred to HFSW (PCP, Medications, Transportation, ETOH Abuse, Drug Abuse, Insurance, Financial ): Yes or No Refer to Pharmacy: Yes or No Refer to Home Health: Yes on No Refer to Advanced Heart Failure Clinic: Yes or no  Refer to General Cardiology: Yes or No  Follow up

## 2023-05-04 NOTE — H&P (View-Only) (Signed)
  HEART & VASCULAR TRANSITION OF CARE CONSULT NOTE     Referring Physician: Primary Care: Primary Cardiologist:  HPI: Referred to clinic by *** for heart failure consultation.   Cardiac Testing    Review of Systems: [y] = yes, [ ] = no   General: Weight gain [ ]; Weight loss [ ]; Anorexia [ ]; Fatigue [ ]; Fever [ ]; Chills [ ]; Weakness [ ]  Cardiac: Chest pain/pressure [ ]; Resting SOB [ ]; Exertional SOB [ ]; Orthopnea [ ]; Pedal Edema [ ]; Palpitations [ ]; Syncope [ ]; Presyncope [ ]; Paroxysmal nocturnal dyspnea[ ]  Pulmonary: Cough [ ]; Wheezing[ ]; Hemoptysis[ ]; Sputum [ ]; Snoring [ ]  GI: Vomiting[ ]; Dysphagia[ ]; Melena[ ]; Hematochezia [ ]; Heartburn[ ]; Abdominal pain [ ]; Constipation [ ]; Diarrhea [ ]; BRBPR [ ]  GU: Hematuria[ ]; Dysuria [ ]; Nocturia[ ]  Vascular: Pain in legs with walking [ ]; Pain in feet with lying flat [ ]; Non-healing sores [ ]; Stroke [ ]; TIA [ ]; Slurred speech [ ];  Neuro: Headaches[ ]; Vertigo[ ]; Seizures[ ]; Paresthesias[ ];Blurred vision [ ]; Diplopia [ ]; Vision changes [ ]  Ortho/Skin: Arthritis [ ]; Joint pain [ ]; Muscle pain [ ]; Joint swelling [ ]; Back Pain [ ]; Rash [ ]  Psych: Depression[ ]; Anxiety[ ]  Heme: Bleeding problems [ ]; Clotting disorders [ ]; Anemia [ ]  Endocrine: Diabetes [ ]; Thyroid dysfunction[ ]   Past Medical History:  Diagnosis Date   Abscess of groin, left    Acute on chronic diastolic (congestive) heart failure ([name]) 02/05/2022   Acute on chronic respiratory failure with hypoxia ([name]) 04/11/2022   Acute osteomyelitis of ankle or foot, left ([name]) 01/31/2018   Acute respiratory failure with hypoxia and hypercarbia ([name]) 06/15/2020   Acute tubular necrosis ([name]) 07/12/2022   Alveolar hypoventilation    Amputation stump infection ([name]) 04/09/2018   Anemia    not on iron pill   Asthma    Bipolar 2 disorder ([name])    Candidiasis of vagina 05/15/2022   Carpal tunnel syndrome on right    recurrent    Cellulitis 08/2010-08/2011   Chest pain with low risk for cardiac etiology 05/11/2021   Chronic pain    COPD (chronic obstructive pulmonary disease) ([name])    Symbicort daily and Proventil as needed   Costochondritis    De Quervain's tenosynovitis, bilateral 11/01/2015   Depression    Diabetes mellitus type II, uncontrolled 2000   Type 2, Uncontrolled.Takes Lantus daily.Fasting blood sugar runs 150   Diabetic ulcer of right foot ([name]) 12/19/2021   Drug-seeking behavior    Elevated troponin    Exposure to mold 04/21/2020   Family history of metabolic acidosis with increased anion gap 04/12/2023   GERD (gastroesophageal reflux disease)    takes Pantoprazole and Zantac daily   HLD (hyperlipidemia)    takes Atorvastatin daily   Hypertension    takes Lisinopril and Coreg daily   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  Mild diffuse proximal LAD stenosis with no evidence of obstructive disease (20 to 30% diffuse stenosis);  2.  Left dominant circumflex with widely patent obtuse   marginal branches and left PDA branch, no significant stenoses present  3.  Nondominant RCA with mild diffuse nonobstructive stenosis    OSA on CPAP    Peripheral neuropathy    takes Gabapentin daily   Pneumonia    "walking" several yrs ago and as a baby (12/05/2018)   Pneumonia due to COVID-19 virus 04/14/2021   Primary osteoarthritis of first carpometacarpal joint of left hand 07/30/2016   Rectal fissure    Restless leg    Right carpal tunnel syndrome 09/01/2011   SVT (supraventricular tachycardia)    Syncope 02/25/2016   Thrombocytosis 04/11/2022   Urinary frequency    Urinary incontinence 10/23/2020   UTI (urinary tract infection) 04/15/2021    Varicose veins    Right medial thigh and Left leg     Current Outpatient Medications  Medication Sig Dispense Refill   acetaminophen (TYLENOL) 325 MG tablet Take 1-2 tablets (325-650 mg total) by mouth every 4 (four) hours as needed for mild pain.     albuterol (VENTOLIN HFA) 108 (90 Base) MCG/ACT inhaler Inhale 2 puffs into the lungs every 6 (six) hours as needed for wheezing or shortness of breath. (Patient not taking: Reported on 04/26/2023) 18 g 4   amLODipine (NORVASC) 10 MG tablet Take 1 tablet (10 mg total) by mouth daily. 90 tablet 3   aspirin EC 81 MG tablet Take 1 tablet (81 mg total) by mouth daily. Swallow whole. 90 tablet 3   Brinzolamide-Brimonidine (SIMBRINZA) 1-0.2 % SUSP Place 1 drop into both eyes in the morning, at noon, and at bedtime.     budesonide-formoterol (SYMBICORT) 160-4.5 MCG/ACT inhaler Inhale 2 puffs into the lungs 2 (two) times daily. (Patient not taking: Reported on 04/26/2023) 10.3 g 4   cetirizine (ZYRTEC) 10 MG tablet Take 1 tablet (10 mg total) by mouth daily as needed for allergies. 30 tablet 3   Continuous Blood Gluc Sensor (DEXCOM G6 SENSOR) MISC Inject 1 applicator into the skin as directed. Change sensor every 10 days. (Patient not taking: Reported on 04/26/2023) 3 each 11   Continuous Blood Gluc Transmit (DEXCOM G6 TRANSMITTER) MISC Inject 1 Device into the skin as directed. Reuse 8 times with sensor changes. (Patient not taking: Reported on 04/26/2023) 1 each 3   diclofenac Sodium (VOLTAREN) 1 % GEL Apply 2 g topically 4 (four) times daily. 150 g 0   DULoxetine (CYMBALTA) 60 MG capsule TAKE 2 CAPSULES BY MOUTH DAILY 60 capsule 1   gabapentin (NEURONTIN) 300 MG capsule Take 2 capsules (600 mg total) by mouth at bedtime. 60 capsule 0   GNP ULTICARE PEN NEEDLES 32G X 4 MM MISC USE TO inject insulin 2 TIMES DAILY (Patient not taking: Reported on 04/26/2023) 100 each 2   insulin aspart (NOVOLOG FLEXPEN) 100 UNIT/ML FlexPen Inject 20 Units into the skin 3 (three)  times daily with meals. (Patient taking differently: Inject 45 Units into the skin 3 (three) times daily with meals.) 45 mL 3   insulin degludec (TRESIBA FLEXTOUCH) 200 UNIT/ML FlexTouch Pen Inject 70 Units into the skin 2 (two) times daily. 30 mL 3   Insulin Disposable Pump (OMNIPOD 5 G6 POD, GEN 5,) MISC Inject into the skin daily.     ipratropium-albuterol (DUONEB) 0.5-2.5 (3) MG/3ML SOLN Take 3 mLs by nebulization every 6 (six) hours as needed. (Patient not taking: Reported on 04/26/2023) 360 mL 11   latanoprost (XALATAN) 0.005 % ophthalmic solution Place 1 drop into both eyes daily.     lidocaine (LIDODERM) 5 %   Place 1 patch onto the skin daily. Remove & Discard patch within 12 hours or as directed by MD 30 patch 0   metolazone (ZAROXOLYN) 5 MG tablet Take 1 tablet (5 mg total) by mouth every Monday, Wednesday, and Friday. 30 tablet 3   Multiple Vitamins-Minerals (ONE-A-DAY WOMENS) tablet Take 1 tablet by mouth daily.     nitroGLYCERIN (NITROSTAT) 0.4 MG SL tablet Place 1 tablet (0.4 mg total) under the tongue every 5 (five) minutes as needed for chest pain. (Patient not taking: Reported on 04/26/2023) 30 tablet 2   nystatin (MYCOSTATIN/NYSTOP) powder Apply 1 Application topically 3 (three) times daily. 30 g 0   omeprazole (PRILOSEC) 40 MG capsule Take 1 capsule (40 mg total) by mouth in the morning and at bedtime. 60 capsule 11   ondansetron (ZOFRAN-ODT) 4 MG disintegrating tablet Take 1 tablet (4 mg total) by mouth every 8 (eight) hours as needed for nausea or vomiting. 20 tablet 0   prednisoLONE acetate (PRED FORTE) 1 % ophthalmic suspension Place 1 drop into both eyes 4 (four) times daily.     promethazine (PHENERGAN) 12.5 MG tablet Take 1 tablet (12.5 mg total) by mouth every 6 (six) hours as needed for nausea or vomiting. 20 tablet 0   RHOPRESSA 0.02 % SOLN Place 1 drop into the left eye daily.     rOPINIRole (REQUIP) 1 MG tablet Take 1 tablet (1 mg total) by mouth at bedtime. 90 tablet 1    rosuvastatin (CRESTOR) 10 MG tablet Take 1 tablet (10 mg total) by mouth daily. 30 tablet 0   torsemide (DEMADEX) 100 MG tablet Take 1 tablet (100 mg total) by mouth daily. 30 tablet 0   traZODone (DESYREL) 100 MG tablet TAKE 1 TABLET BY MOUTH AT BEDTIME AS NEEDED FOR SLEEP 90 tablet 1   Vitamin D, Cholecalciferol, 25 MCG (1000 UT) CAPS Take 1,000 Units by mouth daily. 90 capsule 3   No current facility-administered medications for this visit.    Allergies  Allergen Reactions   Cefepime Other (See Comments)    AKI, see records from [facility] hospitalization in January 2020.   Other reaction(s): Other (See Comments) "Shut down organs/kidneys"  Note pt has tolerated Rocephin and Keflex Other reaction(s): Not available   Iodine Other (See Comments)    Kidney dysfunction   Kiwi Extract Shortness Of Breath, Swelling and Anaphylaxis    Other reaction(s): Not available   Morphine And Codeine Nausea And Vomiting   Pentoxifylline Nausea And Vomiting    Trental   Cephalosporins Other (See Comments)    Pt states that they cause her kidneys to shut down   Toradol [Ketorolac Tromethamine] Other (See Comments)    Feels like something is crawling on me   Nubain [Nalbuphine Hcl] Other (See Comments)    "FEELS LIKE SOMETHING CRAWLING ON ME" Hallucinations       Social History   Socioeconomic History   Marital status: Divorced    Spouse name: Not on file   Number of children: 1   Years of education: Not on file   Highest education level: 10th grade  Occupational History    Comment: [employer]  Tobacco Use   Smoking status: Former    Packs/day: 0.25    Years: 15.00    Additional pack years: 0.00    Total pack years: 3.75    Types: Cigarettes    Quit date: 12/06/2005    Years since quitting: 17.4   Smokeless tobacco: Never   Tobacco comments:      smokes for a couple of months  Vaping Use   Vaping Use: Never used  Substance and Sexual Activity   Alcohol use: Not Currently   Drug use: Yes     Types: "Crack" cocaine    Comment: do when stressed out and in pain.   Sexual activity: Yes    Partners: Male  Other Topics Concern   Not on file  Social History Narrative   Lives in [place] with her fiance and 45 yr old dtr.   Social Determinants of Health   Financial Resource Strain: High Risk (04/15/2023)   Overall Financial Resource Strain (CARDIA)    Difficulty of Paying Living Expenses: Hard  Food Insecurity: No Food Insecurity (04/27/2023)   Hunger Vital Sign    Worried About Running Out of Food in the Last Year: Never true    Ran Out of Food in the Last Year: Never true  Transportation Needs: No Transportation Needs (04/27/2023)   PRAPARE - Transportation    Lack of Transportation (Medical): No    Lack of Transportation (Non-Medical): No  Physical Activity: Not on file  Stress: Stress Concern Present (02/19/2021)   [employer] - Occupational Stress Questionnaire    Feeling of Stress : Very much  Social Connections: Not on file  Intimate Partner Violence: Not At Risk (04/27/2023)   Humiliation, Afraid, Rape, and Kick questionnaire    Fear of Current or Ex-Partner: No    Emotionally Abused: No    Physically Abused: No    Sexually Abused: No      Family History  Problem Relation Age of Onset   Diabetes Mother    Hyperlipidemia Mother    Depression Mother    GER disease Mother    Allergic rhinitis Mother    Restless legs syndrome Mother    Heart attack Paternal Uncle    Heart disease Paternal Grandmother    Heart attack Paternal Grandmother    Heart attack Paternal Grandfather    Heart disease Paternal Grandfather    Heart attack Father    Migraines Sister    Cancer Maternal Grandmother        COLON   Hypertension Maternal Grandmother    Hyperlipidemia Maternal Grandmother    Diabetes Maternal Grandmother    Other Maternal Grandfather        GUN SHOT   Anxiety disorder Sister    Asthma Child     There were no vitals filed for this  visit.  PHYSICAL EXAM: General:  Well appearing. No respiratory difficulty HEENT: normal Neck: supple. no JVD. Carotids 2+ bilat; no bruits. No lymphadenopathy or thryomegaly appreciated. Cor: PMI nondisplaced. Regular rate & rhythm. No rubs, gallops or murmurs. Lungs: clear Abdomen: soft, nontender, nondistended. No hepatosplenomegaly. No bruits or masses. Good bowel sounds. Extremities: no cyanosis, clubbing, rash, edema Neuro: alert & oriented x 3, cranial nerves grossly intact. moves all 4 extremities w/o difficulty. Affect pleasant.  ECG:   ASSESSMENT & PLAN:  NYHA *** GDMT  Diuretic- BB- Ace/ARB/ARNI MRA SGLT2i    Referred to HFSW (PCP, Medications, Transportation, ETOH Abuse, Drug Abuse, Insurance, Financial ): Yes or No Refer to Pharmacy: Yes or No Refer to Home Health: Yes on No Refer to Advanced Heart Failure Clinic: Yes or no  Refer to General Cardiology: Yes or No  Follow up   

## 2023-05-04 NOTE — Patient Instructions (Addendum)
EKG done today.  Labs done today. We will contact you only if your labs are abnormal.  No medication changes were made. Please continue all current medications as prescribed.  If you have any questions or concerns before your next appointment please send [place] a message through [facility] or call our office at [phone].    TO LEAVE A MESSAGE FOR THE NURSE SELECT OPTION 2, PLEASE LEAVE A MESSAGE INCLUDING: YOUR NAME DATE OF BIRTH CALL BACK NUMBER REASON FOR CALL**this is important as we prioritize the call backs  YOU WILL RECEIVE A CALL BACK THE SAME DAY AS LONG AS YOU CALL BEFORE 4:00 PM    You are scheduled for a Cardiac Catheterization on Thursday, June 6 with Dr. Marca Ancona.  1. Please arrive at the [facility] (Main Entrance A) at [facility]: [address] [place], [place] [address] at 5:30 AM (This time is 2 hour(s) before your procedure to ensure your preparation). Free valet parking service is available. You will check in at ADMITTING. The support person will be asked to wait in the waiting room.  It is OK to have someone drop you off and come back when you are ready to be discharged.    Special note: Every effort is made to have your procedure done on time. Please understand that emergencies sometimes delay scheduled procedures.  2. Diet: Do not eat solid foods after midnight.  The patient may have clear liquids until 5am upon the day of the procedure.   4. Medication instructions in preparation for your procedure:  DO NOT take Metolazone and Torsemide the day of your procedure.   Take only 1/2 units of insulin the night before your procedure. Directions for insulin intake are below. Do not take any insulin on the day of the procedure.  TAKE 22.5 units of Novolog insulin the night before your procedure  TAKE 35 units of Tresiba insulin the night before your procedure.   On the morning of your procedure, take your Aspirin 81 mg and any morning medicines NOT  listed above.  You may use sips of water.  5. Plan to go home the same day, you will only stay overnight if medically necessary. 6. Bring a current list of your medications and current insurance cards. 7. You MUST have a responsible person to drive you home. 8. Someone MUST be with you the first 24 hours after you arrive home or your discharge will be delayed. 9. Please wear clothes that are easy to get on and off and wear slip-on shoes.  Thank you for allowing [place] to care for you!   -- [facility]

## 2023-05-06 ENCOUNTER — Ambulatory Visit (INDEPENDENT_AMBULATORY_CARE_PROVIDER_SITE_OTHER): Payer: 59 | Admitting: Podiatry

## 2023-05-06 DIAGNOSIS — M7751 Other enthesopathy of right foot: Secondary | ICD-10-CM | POA: Diagnosis not present

## 2023-05-06 NOTE — Progress Notes (Signed)
Subjective:  Patient ID: [name], female    DOB: Apr 07, 1976,  MRN: [id number]  Chief Complaint  Patient presents with   Foot Pain    Pt stated that she is having pain in her right ankle she stated that the brace did not help     47 y.o. female presents with the above complaint.  Patient presents with right ankle pain.  Patient states pain for touch is progressive gotten worse worse with ambulation worse with pressure pain scale 7 out of 10 dull achy in nature she states that because the way she rests her foot on the wheelchair she has pain to the ATFL ligament.  She denies any other acute complaints.  She would like to do a steroid shot   Review of Systems: Negative except as noted in the HPI. Denies N/V/F/Ch.  Past Medical History:  Diagnosis Date   Abscess of groin, left    Acute on chronic diastolic (congestive) heart failure ([name]) 02/05/2022   Acute on chronic respiratory failure with hypoxia ([name]) 04/11/2022   Acute osteomyelitis of ankle or foot, left ([name]) 01/31/2018   Acute respiratory failure with hypoxia and hypercarbia ([name]) 06/15/2020   Acute tubular necrosis ([name]) 07/12/2022   Alveolar hypoventilation    Amputation stump infection ([name]) 04/09/2018   Anemia    not on iron pill   Asthma    Bipolar 2 disorder ([name])    Candidiasis of vagina 05/15/2022   Carpal tunnel syndrome on right    recurrent   Cellulitis 08/2010-08/2011   Chest pain with low risk for cardiac etiology 05/11/2021   Chronic pain    COPD (chronic obstructive pulmonary disease) ([name])    Symbicort daily and Proventil as needed   Costochondritis    De Quervain's tenosynovitis, bilateral 11/01/2015   Depression    Diabetes mellitus type II, uncontrolled 2000   Type 2, Uncontrolled.Takes Lantus daily.Fasting blood sugar runs 150   Diabetic ulcer of right foot ([name]) 12/19/2021   Drug-seeking behavior    Elevated troponin    Exposure to mold 04/21/2020   Family history of metabolic acidosis with  increased anion gap 04/12/2023   GERD (gastroesophageal reflux disease)    takes Pantoprazole and Zantac daily   HLD (hyperlipidemia)    takes Atorvastatin daily   Hypertension    takes Lisinopril and Coreg daily   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  Mild diffuse proximal LAD stenosis with no evidence of obstructive disease (20 to 30% diffuse stenosis);  2.  Left dominant circumflex with widely patent obtuse marginal branches and left PDA branch, no significant stenoses present  3.  Nondominant RCA with mild diffuse nonobstructive stenosis    OSA on CPAP    Peripheral neuropathy    takes Gabapentin daily   Pneumonia    "walking" several yrs ago and as a baby (12/05/2018)   Pneumonia due to COVID-19 virus 04/14/2021   Primary osteoarthritis of first carpometacarpal joint of left hand 07/30/2016   Rectal fissure    Restless leg    Right carpal tunnel syndrome 09/01/2011   SVT (supraventricular tachycardia)    Syncope 02/25/2016   Thrombocytosis 04/11/2022   Urinary frequency    Urinary incontinence 10/23/2020   UTI (urinary tract  infection) 04/15/2021   Varicose veins    Right medial thigh and Left leg     Current Outpatient Medications:    acetaminophen (TYLENOL) 325 MG tablet, Take 1-2 tablets (325-650 mg total) by mouth every 4 (four) hours as needed for mild pain., Disp: , Rfl:    albuterol (VENTOLIN HFA) 108 (90 Base) MCG/ACT inhaler, Inhale 2 puffs into the lungs every 6 (six) hours as needed for wheezing or shortness of breath., Disp: 18 g, Rfl: 4   amLODipine (NORVASC) 10 MG tablet, Take 1 tablet (10 mg total) by mouth daily., Disp: 90 tablet, Rfl: 3   aspirin EC 81 MG tablet, Take 1 tablet (81 mg total) by mouth  daily. Swallow whole., Disp: 90 tablet, Rfl: 3   Brinzolamide-Brimonidine (SIMBRINZA) 1-0.2 % SUSP, Place 1 drop into both eyes in the morning, at noon, and at bedtime., Disp: , Rfl:    budesonide-formoterol (SYMBICORT) 160-4.5 MCG/ACT inhaler, Inhale 2 puffs into the lungs 2 (two) times daily., Disp: 10.3 g, Rfl: 4   cetirizine (ZYRTEC) 10 MG tablet, Take 1 tablet (10 mg total) by mouth daily as needed for allergies., Disp: 30 tablet, Rfl: 3   Continuous Blood Gluc Sensor (DEXCOM G6 SENSOR) MISC, Inject 1 applicator into the skin as directed. Change sensor every 10 days., Disp: 3 each, Rfl: 11   Continuous Blood Gluc Transmit (DEXCOM G6 TRANSMITTER) MISC, Inject 1 Device into the skin as directed. Reuse 8 times with sensor changes., Disp: 1 each, Rfl: 3   diclofenac Sodium (VOLTAREN) 1 % GEL, Apply 2 g topically 4 (four) times daily., Disp: 150 g, Rfl: 0   DULoxetine (CYMBALTA) 60 MG capsule, TAKE 2 CAPSULES BY MOUTH DAILY, Disp: 60 capsule, Rfl: 1   gabapentin (NEURONTIN) 300 MG capsule, Take 2 capsules (600 mg total) by mouth at bedtime., Disp: 60 capsule, Rfl: 0   GNP ULTICARE PEN NEEDLES 32G X 4 MM MISC, USE TO inject insulin 2 TIMES DAILY, Disp: 100 each, Rfl: 2   insulin aspart (NOVOLOG) 100 UNIT/ML injection, Inject 45 Units into the skin 3 (three) times daily before meals., Disp: , Rfl:    insulin degludec (TRESIBA FLEXTOUCH) 200 UNIT/ML FlexTouch Pen, Inject 70 Units into the skin 2 (two) times daily., Disp: 30 mL, Rfl: 3   Insulin Disposable Pump (OMNIPOD 5 G6 POD, GEN 5,) MISC, Inject into the skin daily., Disp: , Rfl:    ipratropium-albuterol (DUONEB) 0.5-2.5 (3) MG/3ML SOLN, Take 3 mLs by nebulization every 6 (six) hours as needed., Disp: 360 mL, Rfl: 11   latanoprost (XALATAN) 0.005 % ophthalmic solution, Place 1 drop into both eyes daily., Disp: , Rfl:    metolazone (ZAROXOLYN) 5 MG tablet, Take 1 tablet (5 mg total) by mouth every Monday, Wednesday, and Friday., Disp: 30 tablet, Rfl:  3   Multiple Vitamins-Minerals (ONE-A-DAY WOMENS) tablet, Take 1 tablet by mouth daily., Disp: , Rfl:    nitroGLYCERIN (NITROSTAT) 0.4 MG SL tablet, Place 1 tablet (0.4 mg total) under the tongue every 5 (five) minutes as needed for chest pain., Disp: 30 tablet, Rfl: 2   nystatin (MYCOSTATIN/NYSTOP) powder, Apply 1 Application topically 3 (three) times daily., Disp: 30 g, Rfl: 0   omeprazole (PRILOSEC) 40 MG capsule, Take 1 capsule (40 mg total) by mouth in the morning and at bedtime., Disp: 60 capsule, Rfl: 11   ondansetron (ZOFRAN-ODT) 4 MG disintegrating tablet, Take 1 tablet (4 mg total) by mouth every 8 (eight) hours as needed for nausea or vomiting., Disp:  20 tablet, Rfl: 0   prednisoLONE acetate (PRED FORTE) 1 % ophthalmic suspension, Place 1 drop into both eyes 4 (four) times daily., Disp: , Rfl:    promethazine (PHENERGAN) 12.5 MG tablet, Take 1 tablet (12.5 mg total) by mouth every 6 (six) hours as needed for nausea or vomiting., Disp: 20 tablet, Rfl: 0   RHOPRESSA 0.02 % SOLN, Place 1 drop into the left eye daily., Disp: , Rfl:    rOPINIRole (REQUIP) 1 MG tablet, Take 1 tablet (1 mg total) by mouth at bedtime., Disp: 90 tablet, Rfl: 1   rosuvastatin (CRESTOR) 10 MG tablet, Take 1 tablet (10 mg total) by mouth daily., Disp: 30 tablet, Rfl: 0   torsemide (DEMADEX) 100 MG tablet, Take 1 tablet (100 mg total) by mouth daily., Disp: 30 tablet, Rfl: 0   traZODone (DESYREL) 100 MG tablet, TAKE 1 TABLET BY MOUTH AT BEDTIME AS NEEDED FOR SLEEP, Disp: 90 tablet, Rfl: 1   Vitamin D, Cholecalciferol, 25 MCG (1000 UT) CAPS, Take 1,000 Units by mouth daily., Disp: 90 capsule, Rfl: 3  Social History   Tobacco Use  Smoking Status Former   Packs/day: 0.25   Years: 15.00   Additional pack years: 0.00   Total pack years: 3.75   Types: Cigarettes   Quit date: 12/06/2005   Years since quitting: 17.4  Smokeless Tobacco Never  Tobacco Comments   smokes for a couple of months    Allergies  Allergen  Reactions   Cefepime Other (See Comments)    AKI, see records from [facility] hospitalization in January 2020.   Other reaction(s): Other (See Comments) "Shut down organs/kidneys"  Note pt has tolerated Rocephin and Keflex Other reaction(s): Not available   Iodine Other (See Comments)    Kidney dysfunction   Kiwi Extract Shortness Of Breath, Swelling and Anaphylaxis    Other reaction(s): Not available   Morphine And Codeine Nausea And Vomiting   Pentoxifylline Nausea And Vomiting    Trental   Cephalosporins Other (See Comments)    Pt states that they cause her kidneys to shut down   Toradol [Ketorolac Tromethamine] Other (See Comments)    Feels like something is crawling on me   Nubain [Nalbuphine Hcl] Other (See Comments)    "FEELS LIKE SOMETHING CRAWLING ON ME" Hallucinations    Objective:  There were no vitals filed for this visit. There is no height or weight on file to calculate BMI. Constitutional Well developed. Well nourished.  Vascular Dorsalis pedis pulses palpable bilaterally. Posterior tibial pulses palpable bilaterally. Capillary refill normal to all digits.  No cyanosis or clubbing noted. Pedal hair growth normal.  Neurologic Normal speech. Oriented to person, place, and time. Epicritic sensation to light touch grossly present bilaterally.  Dermatologic Nails well groomed and normal in appearance. No open wounds. No skin lesions.  Orthopedic: Pain on palpation left lateral ankle at the level of the ATFL ligament.  Pain with range of motion of the ankle joint no deep intra-articular pain noted no pain at the posterior tibial tendon Achilles tendon peroneal tendon   Radiographs: None Assessment:   1. Capsulitis of ankle, right    Plan:  Patient was evaluated and treated and all questions answered.  Right ankle capsulitis -All questions and concerns were discussed with the patient extensive detail given the amount of pain that she is experiencing she will  benefit from steroid injection help decrease inflammatory component associate with pain.  Patient agrees with plan like to proceed with steroid injection -  A steroid injection was performed at right ankle joint using 1% plain Lidocaine and 10 mg of Kenalog. This was well tolerated.   No follow-ups on file.

## 2023-05-07 DIAGNOSIS — J45909 Unspecified asthma, uncomplicated: Secondary | ICD-10-CM | POA: Diagnosis not present

## 2023-05-07 DIAGNOSIS — J9610 Chronic respiratory failure, unspecified whether with hypoxia or hypercapnia: Secondary | ICD-10-CM | POA: Diagnosis not present

## 2023-05-07 DIAGNOSIS — E669 Obesity, unspecified: Secondary | ICD-10-CM | POA: Diagnosis not present

## 2023-05-07 DIAGNOSIS — R531 Weakness: Secondary | ICD-10-CM | POA: Diagnosis not present

## 2023-05-07 DIAGNOSIS — G4733 Obstructive sleep apnea (adult) (pediatric): Secondary | ICD-10-CM | POA: Diagnosis not present

## 2023-05-07 DIAGNOSIS — E662 Morbid (severe) obesity with alveolar hypoventilation: Secondary | ICD-10-CM | POA: Diagnosis not present

## 2023-05-07 DIAGNOSIS — Z7409 Other reduced mobility: Secondary | ICD-10-CM | POA: Diagnosis not present

## 2023-05-07 DIAGNOSIS — M86172 Other acute osteomyelitis, left ankle and foot: Secondary | ICD-10-CM | POA: Diagnosis not present

## 2023-05-10 ENCOUNTER — Telehealth: Payer: Self-pay

## 2023-05-10 DIAGNOSIS — Z7409 Other reduced mobility: Secondary | ICD-10-CM

## 2023-05-10 NOTE — Telephone Encounter (Signed)
Patient calls nurse line for (2) reasons.   She reports the DME order for the lift chair needs to be faxed to her CAP worker at [phone]. I searched through her chart and was not able to find this DME order. Can we place another one and I will fax.   She also reports paperwork was going to be faxed over for wheelchair repairs. I did not see order in PCP box.   Will forward to PCP.

## 2023-05-11 ENCOUNTER — Other Ambulatory Visit (HOSPITAL_COMMUNITY): Payer: Self-pay

## 2023-05-11 DIAGNOSIS — Z961 Presence of intraocular lens: Secondary | ICD-10-CM | POA: Diagnosis not present

## 2023-05-11 DIAGNOSIS — H4053X4 Glaucoma secondary to other eye disorders, bilateral, indeterminate stage: Secondary | ICD-10-CM | POA: Diagnosis not present

## 2023-05-11 DIAGNOSIS — H15103 Unspecified episcleritis, bilateral: Secondary | ICD-10-CM | POA: Diagnosis not present

## 2023-05-11 DIAGNOSIS — E113513 Type 2 diabetes mellitus with proliferative diabetic retinopathy with macular edema, bilateral: Secondary | ICD-10-CM | POA: Diagnosis not present

## 2023-05-12 ENCOUNTER — Ambulatory Visit (HOSPITAL_COMMUNITY)
Admission: RE | Admit: 2023-05-12 | Discharge: 2023-05-12 | Disposition: A | Discharge status: Home / Self Care | Payer: 59 | Attending: Cardiology | Admitting: Cardiology

## 2023-05-12 ENCOUNTER — Encounter (HOSPITAL_COMMUNITY)
Admission: RE | Disposition: A | Discharge status: Home / Self Care | Payer: Self-pay | Source: Home / Self Care | Attending: Cardiology

## 2023-05-12 ENCOUNTER — Other Ambulatory Visit: Payer: Self-pay

## 2023-05-12 DIAGNOSIS — Z9981 Dependence on supplemental oxygen: Secondary | ICD-10-CM | POA: Insufficient documentation

## 2023-05-12 DIAGNOSIS — Z794 Long term (current) use of insulin: Secondary | ICD-10-CM | POA: Diagnosis not present

## 2023-05-12 DIAGNOSIS — Z79899 Other long term (current) drug therapy: Secondary | ICD-10-CM | POA: Diagnosis not present

## 2023-05-12 DIAGNOSIS — Z89512 Acquired absence of left leg below knee: Secondary | ICD-10-CM | POA: Diagnosis not present

## 2023-05-12 DIAGNOSIS — Z87891 Personal history of nicotine dependence: Secondary | ICD-10-CM | POA: Diagnosis not present

## 2023-05-12 DIAGNOSIS — N1832 Chronic kidney disease, stage 3b: Secondary | ICD-10-CM | POA: Insufficient documentation

## 2023-05-12 DIAGNOSIS — I130 Hypertensive heart and chronic kidney disease with heart failure and stage 1 through stage 4 chronic kidney disease, or unspecified chronic kidney disease: Secondary | ICD-10-CM | POA: Insufficient documentation

## 2023-05-12 DIAGNOSIS — I509 Heart failure, unspecified: Secondary | ICD-10-CM

## 2023-05-12 DIAGNOSIS — I10 Essential (primary) hypertension: Secondary | ICD-10-CM | POA: Diagnosis not present

## 2023-05-12 DIAGNOSIS — G4733 Obstructive sleep apnea (adult) (pediatric): Secondary | ICD-10-CM | POA: Insufficient documentation

## 2023-05-12 DIAGNOSIS — R000 Tachycardia, unspecified: Secondary | ICD-10-CM | POA: Diagnosis not present

## 2023-05-12 DIAGNOSIS — I5032 Chronic diastolic (congestive) heart failure: Secondary | ICD-10-CM | POA: Insufficient documentation

## 2023-05-12 DIAGNOSIS — E1165 Type 2 diabetes mellitus with hyperglycemia: Secondary | ICD-10-CM | POA: Diagnosis not present

## 2023-05-12 DIAGNOSIS — G8911 Acute pain due to trauma: Secondary | ICD-10-CM | POA: Diagnosis not present

## 2023-05-12 DIAGNOSIS — I2721 Secondary pulmonary arterial hypertension: Secondary | ICD-10-CM | POA: Diagnosis not present

## 2023-05-12 DIAGNOSIS — Z6841 Body Mass Index (BMI) 40.0 and over, adult: Secondary | ICD-10-CM | POA: Insufficient documentation

## 2023-05-12 DIAGNOSIS — I272 Pulmonary hypertension, unspecified: Secondary | ICD-10-CM | POA: Diagnosis not present

## 2023-05-12 DIAGNOSIS — W19XXXA Unspecified fall, initial encounter: Secondary | ICD-10-CM | POA: Diagnosis not present

## 2023-05-12 DIAGNOSIS — E1122 Type 2 diabetes mellitus with diabetic chronic kidney disease: Secondary | ICD-10-CM | POA: Insufficient documentation

## 2023-05-12 HISTORY — PX: RIGHT HEART CATH: CATH118263

## 2023-05-12 LAB — POCT I-STAT EG7
Acid-Base Excess: 5 mmol/L — ABNORMAL HIGH (ref 0.0–2.0)
Acid-Base Excess: 5 mmol/L — ABNORMAL HIGH (ref 0.0–2.0)
Bicarbonate: 34.8 mmol/L — ABNORMAL HIGH (ref 20.0–28.0)
Bicarbonate: 35.2 mmol/L — ABNORMAL HIGH (ref 20.0–28.0)
Calcium, Ion: 1.11 mmol/L — ABNORMAL LOW (ref 1.15–1.40)
Calcium, Ion: 1.12 mmol/L — ABNORMAL LOW (ref 1.15–1.40)
HCT: 35 % — ABNORMAL LOW (ref 36.0–46.0)
HCT: 36 % (ref 36.0–46.0)
Hemoglobin: 11.9 g/dL — ABNORMAL LOW (ref 12.0–15.0)
Hemoglobin: 12.2 g/dL (ref 12.0–15.0)
O2 Saturation: 47 %
O2 Saturation: 49 %
Potassium: 4.2 mmol/L (ref 3.5–5.1)
Potassium: 4.2 mmol/L (ref 3.5–5.1)
Sodium: 133 mmol/L — ABNORMAL LOW (ref 135–145)
Sodium: 133 mmol/L — ABNORMAL LOW (ref 135–145)
TCO2: 37 mmol/L — ABNORMAL HIGH (ref 22–32)
TCO2: 38 mmol/L — ABNORMAL HIGH (ref 22–32)
pCO2, Ven: 80.1 mmHg (ref 44–60)
pCO2, Ven: 81.9 mmHg (ref 44–60)
pH, Ven: 7.241 — ABNORMAL LOW (ref 7.25–7.43)
pH, Ven: 7.245 — ABNORMAL LOW (ref 7.25–7.43)
pO2, Ven: 31 mmHg — CL (ref 32–45)
pO2, Ven: 32 mmHg (ref 32–45)

## 2023-05-12 LAB — GLUCOSE, CAPILLARY
Glucose-Capillary: 406 mg/dL — ABNORMAL HIGH (ref 70–99)
Glucose-Capillary: 390 mg/dL — ABNORMAL HIGH (ref 70–99)

## 2023-05-12 LAB — HCG, SERUM, QUALITATIVE: Preg, Serum: NEGATIVE

## 2023-05-12 SURGERY — RIGHT HEART CATH
Anesthesia: LOCAL

## 2023-05-12 MED ORDER — SODIUM CHLORIDE 0.9 % IV SOLN: 250.0000 mL | INTRAVENOUS | Status: DC | PRN

## 2023-05-12 MED ORDER — LIDOCAINE HCL (PF) 1 % IJ SOLN
INTRAMUSCULAR | Status: AC
  Filled 2023-05-12: qty 30

## 2023-05-12 MED ORDER — SODIUM CHLORIDE 0.9% FLUSH: 3.0000 mL | INTRAVENOUS | Status: DC | PRN

## 2023-05-12 MED ORDER — ACETAMINOPHEN 325 MG PO TABS
650.0000 mg | ORAL_TABLET | ORAL | Status: DC | PRN
  Filled 2023-05-12: qty 2

## 2023-05-12 MED ORDER — INSULIN ASPART 100 UNIT/ML IJ SOLN
10.0000 [IU] | Freq: Once | INTRAMUSCULAR | Status: AC
  Administered 2023-05-12: 10 [IU] via SUBCUTANEOUS
  Filled 2023-05-12: qty 1

## 2023-05-12 MED ORDER — LIDOCAINE HCL (PF) 1 % IJ SOLN
INTRAMUSCULAR | Status: DC | PRN
  Administered 2023-05-12: 2 mL

## 2023-05-12 MED ORDER — HYDRALAZINE HCL 20 MG/ML IJ SOLN: 10.0000 mg | INTRAMUSCULAR | Status: DC | PRN

## 2023-05-12 MED ORDER — SODIUM CHLORIDE 0.9% FLUSH: 3.0000 mL | Freq: Two times a day (BID) | INTRAVENOUS | Status: DC

## 2023-05-12 MED ORDER — HEPARIN (PORCINE) IN NACL 1000-0.9 UT/500ML-% IV SOLN
INTRAVENOUS | Status: DC | PRN
  Administered 2023-05-12: 500 mL

## 2023-05-12 MED ORDER — ONDANSETRON HCL 4 MG/2ML IJ SOLN: 4.0000 mg | Freq: Four times a day (QID) | INTRAMUSCULAR | Status: DC | PRN

## 2023-05-12 MED ORDER — SODIUM CHLORIDE 0.9 % IV SOLN: INTRAVENOUS | Status: DC

## 2023-05-12 MED ORDER — LABETALOL HCL 5 MG/ML IV SOLN: 10.0000 mg | INTRAVENOUS | Status: DC | PRN

## 2023-05-12 MED ORDER — TORSEMIDE 100 MG PO TABS: ORAL_TABLET | ORAL | 0 refills | Status: DC

## 2023-05-12 MED ORDER — POTASSIUM CHLORIDE CRYS ER 20 MEQ PO TBCR: 20.0000 meq | EXTENDED_RELEASE_TABLET | Freq: Every day | ORAL | 3 refills | Status: DC

## 2023-05-12 SURGICAL SUPPLY — 6 items
CATH BALLN WEDGE 5F 110CM (CATHETERS) IMPLANT
KIT HEART LEFT (KITS) ×2 IMPLANT
PACK CARDIAC CATHETERIZATION (CUSTOM PROCEDURE TRAY) ×2 IMPLANT
SHEATH GLIDE SLENDER 4/5FR (SHEATH) IMPLANT
TRANSDUCER W/STOPCOCK (MISCELLANEOUS) ×2 IMPLANT
WIRE EMERALD 3MM-J .025X260CM (WIRE) IMPLANT

## 2023-05-12 NOTE — Interval H&P Note (Signed)
History and Physical Interval Note:  05/12/2023 7:50 AM  [name]  has presented today for surgery, with the diagnosis of heart failure.  The various methods of treatment have been discussed with the patient and family. After consideration of risks, benefits and other options for treatment, the patient has consented to  Procedure(s): RIGHT HEART CATH (N/A) as a surgical intervention.  The patient's history has been reviewed, patient examined, no change in status, stable for surgery.  I have reviewed the patient's chart and labs.  Questions were answered to the patient's satisfaction.     Burnell Matlin [employer]

## 2023-05-12 NOTE — Discharge Instructions (Addendum)
1. Increase torsemide to 100 mg in the morning and 50 mg in the evening 2. Add potassium 20 mEq daily to regimen.  3. Will need kidney function check next week.    Activity Rest as told by your health care provider. Return to your normal activities as told by your health care provider. Ask your health care provider what activities are safe for you. May shower and remove bandage 24 hours after discharge. If you were given a sedative during the procedure, it can affect you for several hours. Do not drive or operate machinery until your health care provider says that it is safe. General instructions      Check your IV insertion area every day for signs of infection. Check for: Redness, swelling, or pain. Fluid or blood. Warmth. Pus or a bad smell. Take over-the-counter and prescription medicines only as told by your health care provider. Contact a health care provider if: You have redness, swelling, warmth, pus, or pain around the IV insertion site.

## 2023-05-13 ENCOUNTER — Other Ambulatory Visit (HOSPITAL_COMMUNITY): Payer: Self-pay

## 2023-05-13 ENCOUNTER — Encounter (HOSPITAL_COMMUNITY): Payer: Self-pay | Admitting: Cardiology

## 2023-05-13 NOTE — Telephone Encounter (Signed)
Lift chair DME order placed.  I do not recall receiving any forms for wheelchair repairs but am happy to sign them if they are sent.  Additionally, completed FMLA paperwork which I received faxed from Scheryl's mother, Ariah Mower. Will return FMLA papers to RN team.  Latrelle Dodrill, MD

## 2023-05-14 ENCOUNTER — Encounter (HOSPITAL_COMMUNITY): Payer: Self-pay | Admitting: Emergency Medicine

## 2023-05-14 ENCOUNTER — Other Ambulatory Visit: Payer: Self-pay

## 2023-05-14 ENCOUNTER — Inpatient Hospital Stay (HOSPITAL_COMMUNITY)
Admission: EM | Admit: 2023-05-14 | Discharge: 2023-05-28 | DRG: 917 | Disposition: A | Discharge status: Hospice | Payer: 59 | Attending: Family Medicine | Admitting: Family Medicine

## 2023-05-14 ENCOUNTER — Emergency Department (HOSPITAL_COMMUNITY): Payer: 59

## 2023-05-14 DIAGNOSIS — Z89421 Acquired absence of other right toe(s): Secondary | ICD-10-CM

## 2023-05-14 DIAGNOSIS — R079 Chest pain, unspecified: Secondary | ICD-10-CM | POA: Diagnosis present

## 2023-05-14 DIAGNOSIS — I2510 Atherosclerotic heart disease of native coronary artery without angina pectoris: Secondary | ICD-10-CM | POA: Diagnosis present

## 2023-05-14 DIAGNOSIS — Z883 Allergy status to other anti-infective agents status: Secondary | ICD-10-CM

## 2023-05-14 DIAGNOSIS — N189 Chronic kidney disease, unspecified: Secondary | ICD-10-CM | POA: Diagnosis present

## 2023-05-14 DIAGNOSIS — Z886 Allergy status to analgesic agent status: Secondary | ICD-10-CM

## 2023-05-14 DIAGNOSIS — I2721 Secondary pulmonary arterial hypertension: Secondary | ICD-10-CM | POA: Diagnosis present

## 2023-05-14 DIAGNOSIS — M79604 Pain in right leg: Secondary | ICD-10-CM | POA: Diagnosis present

## 2023-05-14 DIAGNOSIS — D631 Anemia in chronic kidney disease: Secondary | ICD-10-CM | POA: Diagnosis present

## 2023-05-14 DIAGNOSIS — T405X1A Poisoning by cocaine, accidental (unintentional), initial encounter: Secondary | ICD-10-CM | POA: Diagnosis not present

## 2023-05-14 DIAGNOSIS — R072 Precordial pain: Principal | ICD-10-CM

## 2023-05-14 DIAGNOSIS — Z9981 Dependence on supplemental oxygen: Secondary | ICD-10-CM

## 2023-05-14 DIAGNOSIS — Z888 Allergy status to other drugs, medicaments and biological substances status: Secondary | ICD-10-CM

## 2023-05-14 DIAGNOSIS — Z885 Allergy status to narcotic agent status: Secondary | ICD-10-CM

## 2023-05-14 DIAGNOSIS — I2723 Pulmonary hypertension due to lung diseases and hypoxia: Secondary | ICD-10-CM | POA: Diagnosis present

## 2023-05-14 DIAGNOSIS — I5033 Acute on chronic diastolic (congestive) heart failure: Secondary | ICD-10-CM | POA: Diagnosis present

## 2023-05-14 DIAGNOSIS — J9621 Acute and chronic respiratory failure with hypoxia: Secondary | ICD-10-CM | POA: Diagnosis present

## 2023-05-14 DIAGNOSIS — F1490 Cocaine use, unspecified, uncomplicated: Secondary | ICD-10-CM

## 2023-05-14 DIAGNOSIS — N1832 Chronic kidney disease, stage 3b: Secondary | ICD-10-CM | POA: Diagnosis present

## 2023-05-14 DIAGNOSIS — E662 Morbid (severe) obesity with alveolar hypoventilation: Secondary | ICD-10-CM | POA: Diagnosis present

## 2023-05-14 DIAGNOSIS — E785 Hyperlipidemia, unspecified: Secondary | ICD-10-CM | POA: Diagnosis present

## 2023-05-14 DIAGNOSIS — Z993 Dependence on wheelchair: Secondary | ICD-10-CM

## 2023-05-14 DIAGNOSIS — Z6841 Body Mass Index (BMI) 40.0 and over, adult: Secondary | ICD-10-CM

## 2023-05-14 DIAGNOSIS — Z79899 Other long term (current) drug therapy: Secondary | ICD-10-CM

## 2023-05-14 DIAGNOSIS — E1122 Type 2 diabetes mellitus with diabetic chronic kidney disease: Secondary | ICD-10-CM | POA: Diagnosis present

## 2023-05-14 DIAGNOSIS — Z89512 Acquired absence of left leg below knee: Secondary | ICD-10-CM

## 2023-05-14 DIAGNOSIS — R000 Tachycardia, unspecified: Secondary | ICD-10-CM | POA: Diagnosis not present

## 2023-05-14 DIAGNOSIS — I130 Hypertensive heart and chronic kidney disease with heart failure and stage 1 through stage 4 chronic kidney disease, or unspecified chronic kidney disease: Secondary | ICD-10-CM | POA: Diagnosis present

## 2023-05-14 DIAGNOSIS — Z87891 Personal history of nicotine dependence: Secondary | ICD-10-CM

## 2023-05-14 DIAGNOSIS — Z91018 Allergy to other foods: Secondary | ICD-10-CM

## 2023-05-14 DIAGNOSIS — Z881 Allergy status to other antibiotic agents status: Secondary | ICD-10-CM

## 2023-05-14 DIAGNOSIS — J811 Chronic pulmonary edema: Secondary | ICD-10-CM | POA: Diagnosis not present

## 2023-05-14 DIAGNOSIS — E1165 Type 2 diabetes mellitus with hyperglycemia: Secondary | ICD-10-CM | POA: Diagnosis present

## 2023-05-14 DIAGNOSIS — G4733 Obstructive sleep apnea (adult) (pediatric): Secondary | ICD-10-CM | POA: Diagnosis present

## 2023-05-14 DIAGNOSIS — M79605 Pain in left leg: Secondary | ICD-10-CM | POA: Diagnosis present

## 2023-05-14 DIAGNOSIS — E1151 Type 2 diabetes mellitus with diabetic peripheral angiopathy without gangrene: Secondary | ICD-10-CM | POA: Diagnosis present

## 2023-05-14 DIAGNOSIS — N179 Acute kidney failure, unspecified: Secondary | ICD-10-CM | POA: Diagnosis present

## 2023-05-14 DIAGNOSIS — G2581 Restless legs syndrome: Secondary | ICD-10-CM | POA: Diagnosis present

## 2023-05-14 DIAGNOSIS — Z515 Encounter for palliative care: Secondary | ICD-10-CM

## 2023-05-14 DIAGNOSIS — E876 Hypokalemia: Secondary | ICD-10-CM | POA: Diagnosis not present

## 2023-05-14 DIAGNOSIS — Z66 Do not resuscitate: Secondary | ICD-10-CM | POA: Diagnosis present

## 2023-05-14 DIAGNOSIS — Z8616 Personal history of COVID-19: Secondary | ICD-10-CM

## 2023-05-14 DIAGNOSIS — Z7982 Long term (current) use of aspirin: Secondary | ICD-10-CM

## 2023-05-14 DIAGNOSIS — Z794 Long term (current) use of insulin: Secondary | ICD-10-CM

## 2023-05-14 DIAGNOSIS — I959 Hypotension, unspecified: Secondary | ICD-10-CM | POA: Diagnosis not present

## 2023-05-14 DIAGNOSIS — R7989 Other specified abnormal findings of blood chemistry: Secondary | ICD-10-CM

## 2023-05-14 DIAGNOSIS — I5032 Chronic diastolic (congestive) heart failure: Secondary | ICD-10-CM | POA: Insufficient documentation

## 2023-05-14 DIAGNOSIS — I50810 Right heart failure, unspecified: Secondary | ICD-10-CM | POA: Diagnosis present

## 2023-05-14 DIAGNOSIS — R0789 Other chest pain: Secondary | ICD-10-CM | POA: Diagnosis not present

## 2023-05-14 DIAGNOSIS — T801XXA Vascular complications following infusion, transfusion and therapeutic injection, initial encounter: Secondary | ICD-10-CM | POA: Diagnosis not present

## 2023-05-14 DIAGNOSIS — I427 Cardiomyopathy due to drug and external agent: Secondary | ICD-10-CM | POA: Diagnosis not present

## 2023-05-14 DIAGNOSIS — J4489 Other specified chronic obstructive pulmonary disease: Secondary | ICD-10-CM | POA: Diagnosis present

## 2023-05-14 DIAGNOSIS — G8929 Other chronic pain: Secondary | ICD-10-CM | POA: Diagnosis present

## 2023-05-14 DIAGNOSIS — E1140 Type 2 diabetes mellitus with diabetic neuropathy, unspecified: Secondary | ICD-10-CM | POA: Diagnosis present

## 2023-05-14 DIAGNOSIS — I808 Phlebitis and thrombophlebitis of other sites: Secondary | ICD-10-CM | POA: Diagnosis present

## 2023-05-14 DIAGNOSIS — T502X5A Adverse effect of carbonic-anhydrase inhibitors, benzothiadiazides and other diuretics, initial encounter: Secondary | ICD-10-CM | POA: Diagnosis not present

## 2023-05-14 DIAGNOSIS — E874 Mixed disorder of acid-base balance: Secondary | ICD-10-CM | POA: Diagnosis present

## 2023-05-14 HISTORY — DX: Cocaine abuse, uncomplicated: F14.10

## 2023-05-14 LAB — I-STAT BETA HCG BLOOD, ED (MC, WL, AP ONLY): I-stat hCG, quantitative: <5 m[IU]/mL (ref <5)

## 2023-05-14 LAB — BASIC METABOLIC PANEL
Anion gap: 18 — ABNORMAL HIGH (ref 5–15)
BUN: 143 mg/dL — ABNORMAL HIGH (ref 6–20)
CO2: 34 mmol/L — ABNORMAL HIGH (ref 22–32)
Calcium: 9.2 mg/dL (ref 8.9–10.3)
Chloride: 85 mmol/L — ABNORMAL LOW (ref 98–111)
Creatinine, Ser: 2.19 mg/dL — ABNORMAL HIGH (ref 0.44–1.00)
GFR, Estimated: 28 mL/min — ABNORMAL LOW (ref >60)
Glucose, Bld: 243 mg/dL — ABNORMAL HIGH (ref 70–99)
Potassium: 3.2 mmol/L — ABNORMAL LOW (ref 3.5–5.1)
Sodium: 137 mmol/L (ref 135–145)

## 2023-05-14 LAB — CBC
HCT: 34.8 % — ABNORMAL LOW (ref 36.0–46.0)
Hemoglobin: 11.2 g/dL — ABNORMAL LOW (ref 12.0–15.0)
MCH: 29.4 pg (ref 26.0–34.0)
MCHC: 32.2 g/dL (ref 30.0–36.0)
MCV: 91.3 fL (ref 80.0–100.0)
Platelets: 329 10*3/uL (ref 150–400)
RBC: 3.81 MIL/uL — ABNORMAL LOW (ref 3.87–5.11)
RDW: 16.4 % — ABNORMAL HIGH (ref 11.5–15.5)
WBC: 8.6 10*3/uL (ref 4.0–10.5)
nRBC: 0.2 % (ref 0.0–0.2)

## 2023-05-14 LAB — BRAIN NATRIURETIC PEPTIDE: B Natriuretic Peptide: 19.3 pg/mL (ref 0.0–100.0)

## 2023-05-14 LAB — TROPONIN I (HIGH SENSITIVITY): Troponin I (High Sensitivity): 43 ng/L — ABNORMAL HIGH (ref <18)

## 2023-05-14 MED ORDER — HYDROMORPHONE HCL 1 MG/ML IJ SOLN
1.0000 mg | Freq: Once | INTRAMUSCULAR | Status: AC
  Administered 2023-05-14: 1 mg via INTRAVENOUS
  Filled 2023-05-14: qty 1

## 2023-05-14 MED ORDER — IPRATROPIUM-ALBUTEROL 0.5-2.5 (3) MG/3ML IN SOLN
3.0000 mL | Freq: Once | RESPIRATORY_TRACT | Status: AC
  Administered 2023-05-14: 3 mL via RESPIRATORY_TRACT
  Filled 2023-05-14: qty 3

## 2023-05-14 MED ORDER — ONDANSETRON HCL 4 MG/2ML IJ SOLN
4.0000 mg | Freq: Once | INTRAMUSCULAR | Status: AC
  Administered 2023-05-14: 4 mg via INTRAVENOUS
  Filled 2023-05-14: qty 2

## 2023-05-14 NOTE — ED Notes (Signed)
Patient transported to X-ray 

## 2023-05-14 NOTE — ED Triage Notes (Signed)
Pt to ED via [facility] from home c/o left chest pain and SOB.  Had heart cath done Thursday with fluid around heart, told stage 3 heart failure.  Also had renal failure, COPD and DBM. Had pain to chest after procedure but states pain to chest is worse tonight and is dull and radiates to back, non productive cough, chronic 3-4L  at home but placed on NRB 15L with EMS.  EMS gave 324 ASA and 0.4mg  nitro without relief.

## 2023-05-14 NOTE — ED Provider Notes (Signed)
[place] EMERGENCY DEPARTMENT AT [facility] Provider Note   CSN: [id number] Arrival date & time: 05/14/23  2214     History {Add pertinent medical, surgical, social history, OB history to HPI:1} Chief Complaint  Patient presents with   Shortness of Breath   Chest Pain    [name] is a 46 y.o. female.  HPI   Patient with medical history including bipolar, hypertension, diabetes, COPD, chronic respiratory failure on 3 to 4 L via nasal cannula, left above-the-knee amputation, presenting with chest pain.  Patient states that started on Friday, came on gradually, states initially was intermittent but now has been constant, does not radiate, states she feels more short of breath denies pleuritic chest pain she does have a cough which she states is new it is productive, no associated fevers chills, she states she does have back pain but thinks this is unrelated to her chest pain, she states that she does have some tight chest which makes it difficult to breathe she notes that she has not been urinating as much as she normally does despite the increase in her torsemide.  Patient denies illicit drug use  Reviewed patient's chart has presented in the past for shortness of breath due to no CHF exacerbation, most recently had right cardiac catheterization on Thursday, which showed pulmonary hypertension, no other acute abnormalities present her torsemide was increased 100 mg daily.   Home Medications Prior to Admission medications   Medication Sig Start Date End Date Taking? Authorizing Provider  acetaminophen (TYLENOL) 325 MG tablet Take 1-2 tablets (325-650 mg total) by mouth every 4 (four) hours as needed for mild pain. 06/04/22   Setzer, Lynnell Jude, PA-C  albuterol (VENTOLIN HFA) 108 (90 Base) MCG/ACT inhaler Inhale 2 puffs into the lungs every 6 (six) hours as needed for wheezing or shortness of breath. 03/28/23   Latrelle Dodrill, MD  amLODipine (NORVASC) 10 MG tablet Take 1  tablet (10 mg total) by mouth daily. 09/27/22   Latrelle Dodrill, MD  aspirin EC 81 MG tablet Take 1 tablet (81 mg total) by mouth daily. Swallow whole. 11/19/20   Tonny Bollman, MD  Brinzolamide-Brimonidine [facility]) 1-0.2 % SUSP Place 1 drop into both eyes in the morning, at noon, and at bedtime.    [provider]  budesonide-formoterol (SYMBICORT) 160-4.5 MCG/ACT inhaler Inhale 2 puffs into the lungs 2 (two) times daily. 03/01/23   Latrelle Dodrill, MD  cetirizine (ZYRTEC) 10 MG tablet Take 1 tablet (10 mg total) by mouth daily as needed for allergies. 02/11/22   Latrelle Dodrill, MD  Continuous Blood Gluc Sensor (DEXCOM G6 SENSOR) MISC Inject 1 applicator into the skin as directed. Change sensor every 10 days. 11/05/21   Latrelle Dodrill, MD  Continuous Blood Gluc Transmit (DEXCOM G6 TRANSMITTER) MISC Inject 1 Device into the skin as directed. Reuse 8 times with sensor changes. 09/03/21   Latrelle Dodrill, MD  diclofenac Sodium (VOLTAREN) 1 % GEL Apply 2 g topically 4 (four) times daily. Patient taking differently: Apply 2 g topically 4 (four) times daily as needed (joint pain). 02/26/23   Fayette Pho, MD  DULoxetine (CYMBALTA) 60 MG capsule TAKE 2 CAPSULES BY MOUTH DAILY 03/28/23   Latrelle Dodrill, MD  gabapentin (NEURONTIN) 300 MG capsule Take 2 capsules (600 mg total) by mouth at bedtime. 04/29/23 05/29/23  Leatha Gilding, MD  GNP ULTICARE PEN NEEDLES 32G X 4 MM MISC USE TO inject insulin 2 TIMES DAILY 10/02/21  Latrelle Dodrill, MD  insulin aspart (NOVOLOG) 100 UNIT/ML injection Inject 45 Units into the skin 3 (three) times daily before meals.    [provider]  insulin degludec (TRESIBA FLEXTOUCH) 200 UNIT/ML FlexTouch Pen Inject 70 Units into the skin 2 (two) times daily. 04/14/23   Alfredo Martinez, MD  Insulin Disposable Pump (OMNIPOD 5 G6 POD, GEN 5,) MISC Inject into the skin daily. Novolog 07/30/22   [provider]   ipratropium-albuterol (DUONEB) 0.5-2.5 (3) MG/3ML SOLN Take 3 mLs by nebulization every 6 (six) hours as needed. 09/24/22   Charlott Holler, MD  latanoprost (XALATAN) 0.005 % ophthalmic solution Place 1 drop into both eyes daily. 02/04/23   [provider]  metolazone (ZAROXOLYN) 5 MG tablet Take 1 tablet (5 mg total) by mouth every Monday, Wednesday, and Friday. 04/29/23   Leatha Gilding, MD  Multiple Vitamins-Minerals (ONE-A-DAY WOMENS) tablet Take 1 tablet by mouth daily.    [provider]  nitroGLYCERIN (NITROSTAT) 0.4 MG SL tablet Place 1 tablet (0.4 mg total) under the tongue every 5 (five) minutes as needed for chest pain. 11/04/22   Latrelle Dodrill, MD  nystatin (MYCOSTATIN/NYSTOP) powder Apply 1 Application topically 3 (three) times daily. 03/07/23   Latrelle Dodrill, MD  omeprazole (PRILOSEC) 40 MG capsule Take 1 capsule (40 mg total) by mouth in the morning and at bedtime. 04/07/23   Charlott Holler, MD  ondansetron (ZOFRAN-ODT) 4 MG disintegrating tablet Take 1 tablet (4 mg total) by mouth every 8 (eight) hours as needed for nausea or vomiting. Patient not taking: Reported on 05/09/2023 04/21/23   Alicia Amel, MD  potassium chloride SA (KLOR-CON M) 20 MEQ tablet Take 1 tablet (20 mEq total) by mouth daily. 05/12/23   Laurey Morale, MD  prednisoLONE acetate (PRED FORTE) 1 % ophthalmic suspension Place 1 drop into both eyes 4 (four) times daily. 04/20/23   [provider]  promethazine (PHENERGAN) 12.5 MG tablet Take 1 tablet (12.5 mg total) by mouth every 6 (six) hours as needed for nausea or vomiting. 04/29/23   Gherghe, Daylene Katayama, MD  RHOPRESSA 0.02 % SOLN Place 1 drop into the left eye daily. 01/21/23   [provider]  rOPINIRole (REQUIP) 1 MG tablet Take 1 tablet (1 mg total) by mouth at bedtime. 12/16/22   Latrelle Dodrill, MD  rosuvastatin (CRESTOR) 10 MG tablet Take 1 tablet (10 mg total) by mouth daily. 04/14/23   Alfredo Martinez, MD   torsemide (DEMADEX) 100 MG tablet Take 1 tablet (100 mg total) by mouth every morning AND 0.5 tablets (50 mg total) every evening. 05/12/23   Laurey Morale, MD  traZODone (DESYREL) 100 MG tablet TAKE 1 TABLET BY MOUTH AT BEDTIME AS NEEDED FOR SLEEP 03/03/23   Latrelle Dodrill, MD  Vitamin D, Cholecalciferol, 25 MCG (1000 UT) CAPS Take 1,000 Units by mouth daily. 10/05/22   Latrelle Dodrill, MD      Allergies    Cefepime, Iodine, Kiwi extract, Morphine and codeine, Pentoxifylline, Cephalosporins, Toradol [ketorolac tromethamine], and Nubain [nalbuphine hcl]    Review of Systems   Review of Systems  Constitutional:  Negative for chills and fever.  Respiratory:  Positive for chest tightness and shortness of breath.   Cardiovascular:  Positive for chest pain.  Gastrointestinal:  Negative for abdominal pain.  Neurological:  Negative for headaches.    Physical Exam Updated Vital Signs BP 120/69   Pulse 96   Temp 98.5 F (  36.9 C) (Oral)   Resp 14   Ht 5\' 2"  (1.575 m)   Wt (!) 150 kg   SpO2 96%   BMI 60.48 kg/m  Physical Exam Vitals and nursing note reviewed.  Constitutional:      General: She is not in acute distress.    Appearance: She is not ill-appearing.  HENT:     Head: Normocephalic and atraumatic.     Nose: No congestion.  Eyes:     Conjunctiva/sclera: Conjunctivae normal.  Cardiovascular:     Rate and Rhythm: Regular rhythm. Tachycardia present.     Pulses: Normal pulses.     Heart sounds: No murmur heard.    No friction rub. No gallop.     Comments: No JVD Pulmonary:     Effort: No respiratory distress.     Breath sounds: No wheezing, rhonchi or rales.     Comments: Patient is on 4 L via nasal cannula, will speak in full sentences, patient has noted tight sounding chest without rales rhonchi or stridor present. Abdominal:     Palpations: Abdomen is soft.     Tenderness: There is no abdominal tenderness. There is no right CVA tenderness or left CVA  tenderness.  Skin:    General: Skin is warm and dry.     Comments: Left above-the-knee amputation, no noted peripheral edema present my exam.  Neurological:     Mental Status: She is alert.  Psychiatric:        Mood and Affect: Mood normal.     ED Results / Procedures / Treatments   Labs (all labs ordered are listed, but only abnormal results are displayed) Labs Reviewed  CBC - Abnormal; Notable for the following components:      Result Value   RBC 3.81 (*)    Hemoglobin 11.2 (*)    HCT 34.8 (*)    RDW 16.4 (*)    All other components within normal limits  BASIC METABOLIC PANEL - Abnormal; Notable for the following components:   Potassium 3.2 (*)    Chloride 85 (*)    CO2 34 (*)    Glucose, Bld 243 (*)    BUN 143 (*)    Creatinine, Ser 2.19 (*)    GFR, Estimated 28 (*)    Anion gap 18 (*)    All other components within normal limits  TROPONIN I (HIGH SENSITIVITY) - Abnormal; Notable for the following components:   Troponin I (High Sensitivity) 43 (*)    All other components within normal limits  BRAIN NATRIURETIC PEPTIDE  RAPID URINE DRUG SCREEN, HOSP PERFORMED  BLOOD GAS, VENOUS  I-STAT BETA HCG BLOOD, ED ([facility], [facility], [facility] ONLY)    EKG EKG Interpretation  Date/Time:  Saturday May 14 2023 22:22:02 EDT Ventricular Rate:  106 PR Interval:  166 QRS Duration: 104 QT Interval:  360 QTC Calculation: 478 R Axis:   93 Text Interpretation: Sinus tachycardia Borderline right axis deviation Borderline low voltage, extremity leads Rate faster Confirmed by Glynn Octave [phone]) on 05/14/2023 11:18:39 PM  Radiology No results found.  Procedures Procedures  {Document cardiac monitor, telemetry assessment procedure when appropriate:1}  Medications Ordered in ED Medications  HYDROmorphone (DILAUDID) injection 1 mg (1 mg Intravenous Given 05/14/23 2253)  ondansetron (ZOFRAN) injection 4 mg (4 mg Intravenous Given 05/14/23 2253)  ipratropium-albuterol (DUONEB) 0.5-2.5 (3) MG/3ML  nebulizer solution 3 mL (3 mLs Nebulization Given 05/14/23 2306)    ED Course/ Medical Decision Making/ A&P   {   Click  here for ABCD2, HEART and other calculatorsREFRESH Note before signing :1}                          Medical Decision Making Amount and/or Complexity of Data Reviewed Labs: ordered. Radiology: ordered.  Risk Prescription drug management.   This patient presents to the ED for concern of chest pain, this involves an extensive number of treatment options, and is a complaint that carries with it a high risk of complications and morbidity.  The differential diagnosis includes ACS, PE, dissection, aneurysm, CHF exacerbation, COPD exacerbation    Additional history obtained:  Additional history obtained from N/A External records from outside source obtained and reviewed including cardiology notes   Co morbidities that complicate the patient evaluation  Chronic respiratory failure, CHF,  Social Determinants of Health:  Polysubstance use    Lab Tests:  I Ordered, and personally interpreted labs.  The pertinent results include: CBC shows normocytic anemia hemoglobin 11.2, BMP reveals potassium 3.2 chloride 85 CO2 of 34 at baseline glucose 242, BUN 143 also about her baseline, creatinine 2.19, GFR 28, anion gap 18, first troponin is 43   Imaging Studies ordered:  I ordered imaging studies including chest x-ray I independently visualized and interpreted imaging which showed  I agree with the radiologist interpretation   Cardiac Monitoring:  The patient was maintained on a cardiac monitor.  I personally viewed and interpreted the cardiac monitored which showed an underlying rhythm of:  sinus not signs of ischemia  Medicines ordered and prescription drug management:  I ordered medication including bronchodilators, antiemetics, analgesics I have reviewed the patients home medicines and have made adjustments as needed  Critical  Interventions:  ***   Reevaluation:  Presents with shortness of breath, chest pain, will obtain screening lab workup chest x-ray and reassess.    Consultations Obtained:  I requested consultation with the ***,  and discussed lab and imaging findings as well as pertinent plan - they recommend: ***    Test Considered:  ***    Rule out ****    Dispostion and problem list  After consideration of the diagnostic results and the patients response to treatment, I feel that the patent would benefit from ***.       {Document critical care time when appropriate:1} {Document review of labs and clinical decision tools ie heart score, Chads2Vasc2 etc:1}  {Document your independent review of radiology images, and any outside records:1} {Document your discussion with family members, caretakers, and with consultants:1} {Document social determinants of health affecting pt's care:1} {Document your decision making why or why not admission, treatments were needed:1} Final Clinical Impression(s) / ED Diagnoses Final diagnoses:  None    Rx / DC Orders ED Discharge Orders     None

## 2023-05-15 DIAGNOSIS — F1490 Cocaine use, unspecified, uncomplicated: Secondary | ICD-10-CM

## 2023-05-15 DIAGNOSIS — Z66 Do not resuscitate: Secondary | ICD-10-CM | POA: Diagnosis not present

## 2023-05-15 DIAGNOSIS — I5033 Acute on chronic diastolic (congestive) heart failure: Secondary | ICD-10-CM | POA: Diagnosis present

## 2023-05-15 DIAGNOSIS — R072 Precordial pain: Secondary | ICD-10-CM

## 2023-05-15 DIAGNOSIS — N179 Acute kidney failure, unspecified: Secondary | ICD-10-CM | POA: Diagnosis not present

## 2023-05-15 DIAGNOSIS — T405X1A Poisoning by cocaine, accidental (unintentional), initial encounter: Secondary | ICD-10-CM | POA: Diagnosis not present

## 2023-05-15 DIAGNOSIS — Z8616 Personal history of COVID-19: Secondary | ICD-10-CM | POA: Diagnosis not present

## 2023-05-15 DIAGNOSIS — D631 Anemia in chronic kidney disease: Secondary | ICD-10-CM | POA: Diagnosis not present

## 2023-05-15 DIAGNOSIS — E1165 Type 2 diabetes mellitus with hyperglycemia: Secondary | ICD-10-CM

## 2023-05-15 DIAGNOSIS — E662 Morbid (severe) obesity with alveolar hypoventilation: Secondary | ICD-10-CM | POA: Diagnosis not present

## 2023-05-15 DIAGNOSIS — I5032 Chronic diastolic (congestive) heart failure: Secondary | ICD-10-CM | POA: Insufficient documentation

## 2023-05-15 DIAGNOSIS — Z794 Long term (current) use of insulin: Secondary | ICD-10-CM

## 2023-05-15 DIAGNOSIS — R7989 Other specified abnormal findings of blood chemistry: Secondary | ICD-10-CM

## 2023-05-15 DIAGNOSIS — I2721 Secondary pulmonary arterial hypertension: Secondary | ICD-10-CM | POA: Diagnosis not present

## 2023-05-15 DIAGNOSIS — Z515 Encounter for palliative care: Secondary | ICD-10-CM | POA: Diagnosis not present

## 2023-05-15 DIAGNOSIS — J9621 Acute and chronic respiratory failure with hypoxia: Secondary | ICD-10-CM | POA: Diagnosis not present

## 2023-05-15 LAB — BLOOD GAS, VENOUS
Acid-Base Excess: 9.7 mmol/L — ABNORMAL HIGH (ref 0.0–2.0)
Bicarbonate: 37.9 mmol/L — ABNORMAL HIGH (ref 20.0–28.0)
O2 Saturation: 82.9 %
Patient temperature: 37
pCO2, Ven: 67 mmHg — ABNORMAL HIGH (ref 44–60)
pH, Ven: 7.36 (ref 7.25–7.43)
pO2, Ven: 49 mmHg — ABNORMAL HIGH (ref 32–45)

## 2023-05-15 LAB — RAPID URINE DRUG SCREEN, HOSP PERFORMED
Amphetamines: NOT DETECTED
Barbiturates: NOT DETECTED
Benzodiazepines: NOT DETECTED
Cocaine: POSITIVE — AB
Opiates: NOT DETECTED
Tetrahydrocannabinol: NOT DETECTED

## 2023-05-15 LAB — BASIC METABOLIC PANEL
Anion gap: 15 (ref 5–15)
BUN: 146 mg/dL — ABNORMAL HIGH (ref 6–20)
CO2: 32 mmol/L (ref 22–32)
Calcium: 8.8 mg/dL — ABNORMAL LOW (ref 8.9–10.3)
Chloride: 88 mmol/L — ABNORMAL LOW (ref 98–111)
Creatinine, Ser: 2.55 mg/dL — ABNORMAL HIGH (ref 0.44–1.00)
GFR, Estimated: 23 mL/min — ABNORMAL LOW (ref >60)
Glucose, Bld: 244 mg/dL — ABNORMAL HIGH (ref 70–99)
Potassium: 3.7 mmol/L (ref 3.5–5.1)
Sodium: 135 mmol/L (ref 135–145)

## 2023-05-15 LAB — CBG MONITORING, ED
Glucose-Capillary: 273 mg/dL — ABNORMAL HIGH (ref 70–99)
Glucose-Capillary: 272 mg/dL — ABNORMAL HIGH (ref 70–99)

## 2023-05-15 LAB — GLUCOSE, CAPILLARY: Glucose-Capillary: 348 mg/dL — ABNORMAL HIGH (ref 70–99)

## 2023-05-15 LAB — TROPONIN I (HIGH SENSITIVITY)
Troponin I (High Sensitivity): 44 ng/L — ABNORMAL HIGH (ref <18)
Troponin I (High Sensitivity): 37 ng/L — ABNORMAL HIGH (ref <18)

## 2023-05-15 MED ORDER — FUROSEMIDE 10 MG/ML IJ SOLN
60.0000 mg | Freq: Once | INTRAMUSCULAR | Status: AC
  Administered 2023-05-15: 60 mg via INTRAVENOUS
  Filled 2023-05-15: qty 6

## 2023-05-15 MED ORDER — INSULIN GLARGINE-YFGN 100 UNIT/ML ~~LOC~~ SOLN
40.0000 [IU] | Freq: Two times a day (BID) | SUBCUTANEOUS | Status: DC
  Administered 2023-05-15 – 2023-05-17 (×6): 40 [IU] via SUBCUTANEOUS
  Filled 2023-05-15 (×7): qty 0.4

## 2023-05-15 MED ORDER — AMLODIPINE BESYLATE 10 MG PO TABS
10.0000 mg | ORAL_TABLET | Freq: Every day | ORAL | Status: DC
  Administered 2023-05-16 – 2023-05-24 (×9): 10 mg via ORAL
  Filled 2023-05-15 (×6): qty 1
  Filled 2023-05-15: qty 2
  Filled 2023-05-15 (×3): qty 1

## 2023-05-15 MED ORDER — BRINZOLAMIDE 1 % OP SUSP
1.0000 [drp] | Freq: Three times a day (TID) | OPHTHALMIC | Status: DC
  Administered 2023-05-15 – 2023-05-28 (×38): 1 [drp] via OPHTHALMIC
  Filled 2023-05-15: qty 10

## 2023-05-15 MED ORDER — HYDROMORPHONE HCL 2 MG PO TABS: 1.0000 mg | ORAL_TABLET | Freq: Four times a day (QID) | ORAL | Status: DC | PRN

## 2023-05-15 MED ORDER — ALBUTEROL SULFATE (2.5 MG/3ML) 0.083% IN NEBU
2.5000 mg | INHALATION_SOLUTION | Freq: Once | RESPIRATORY_TRACT | Status: AC
  Administered 2023-05-15: 2.5 mg via RESPIRATORY_TRACT
  Filled 2023-05-15: qty 3

## 2023-05-15 MED ORDER — BRIMONIDINE TARTRATE 0.2 % OP SOLN
1.0000 [drp] | Freq: Three times a day (TID) | OPHTHALMIC | Status: DC
  Administered 2023-05-15 – 2023-05-28 (×37): 1 [drp] via OPHTHALMIC
  Filled 2023-05-15: qty 5

## 2023-05-15 MED ORDER — ENOXAPARIN SODIUM 40 MG/0.4ML IJ SOSY
40.0000 mg | PREFILLED_SYRINGE | INTRAMUSCULAR | Status: DC
  Administered 2023-05-15: 40 mg via SUBCUTANEOUS
  Filled 2023-05-15: qty 0.4

## 2023-05-15 MED ORDER — DULOXETINE HCL 60 MG PO CPEP
120.0000 mg | ORAL_CAPSULE | Freq: Every day | ORAL | Status: DC
  Administered 2023-05-17: 120 mg via ORAL
  Filled 2023-05-15: qty 4
  Filled 2023-05-15 (×3): qty 2

## 2023-05-15 MED ORDER — ROSUVASTATIN CALCIUM 5 MG PO TABS
10.0000 mg | ORAL_TABLET | Freq: Every day | ORAL | Status: DC
  Administered 2023-05-15 – 2023-05-18 (×4): 10 mg via ORAL
  Filled 2023-05-15 (×4): qty 2

## 2023-05-15 MED ORDER — PREDNISOLONE ACETATE 1 % OP SUSP
1.0000 [drp] | Freq: Four times a day (QID) | OPHTHALMIC | Status: DC
  Administered 2023-05-15 – 2023-05-28 (×39): 1 [drp] via OPHTHALMIC
  Filled 2023-05-15: qty 5

## 2023-05-15 MED ORDER — METOLAZONE 5 MG PO TABS
5.0000 mg | ORAL_TABLET | ORAL | Status: DC
  Administered 2023-05-16 – 2023-05-23 (×4): 5 mg via ORAL
  Filled 2023-05-15 (×5): qty 1

## 2023-05-15 MED ORDER — NETARSUDIL DIMESYLATE 0.02 % OP SOLN: 1.0000 [drp] | Freq: Every day | OPHTHALMIC | Status: DC

## 2023-05-15 MED ORDER — ASPIRIN 81 MG PO TBEC
81.0000 mg | DELAYED_RELEASE_TABLET | Freq: Every day | ORAL | Status: DC
  Administered 2023-05-16 – 2023-05-28 (×12): 81 mg via ORAL
  Filled 2023-05-15 (×12): qty 1

## 2023-05-15 MED ORDER — GABAPENTIN 300 MG PO CAPS
600.0000 mg | ORAL_CAPSULE | Freq: Every day | ORAL | Status: DC
  Administered 2023-05-15 – 2023-05-17 (×3): 600 mg via ORAL
  Filled 2023-05-15 (×3): qty 2

## 2023-05-15 MED ORDER — LATANOPROST 0.005 % OP SOLN
1.0000 [drp] | Freq: Every day | OPHTHALMIC | Status: DC
  Administered 2023-05-15 – 2023-05-27 (×13): 1 [drp] via OPHTHALMIC
  Filled 2023-05-15: qty 2.5

## 2023-05-15 MED ORDER — ALBUTEROL SULFATE (2.5 MG/3ML) 0.083% IN NEBU
2.5000 mg | INHALATION_SOLUTION | Freq: Four times a day (QID) | RESPIRATORY_TRACT | Status: DC | PRN
  Administered 2023-05-15: 2.5 mg via RESPIRATORY_TRACT
  Filled 2023-05-15: qty 3

## 2023-05-15 MED ORDER — ROPINIROLE HCL 0.5 MG PO TABS
1.0000 mg | ORAL_TABLET | Freq: Every day | ORAL | Status: DC
  Administered 2023-05-15 – 2023-05-28 (×14): 1 mg via ORAL
  Filled 2023-05-15 (×15): qty 2

## 2023-05-15 MED ORDER — IPRATROPIUM-ALBUTEROL 0.5-2.5 (3) MG/3ML IN SOLN: 3.0000 mL | Freq: Four times a day (QID) | RESPIRATORY_TRACT | Status: DC | PRN

## 2023-05-15 MED ORDER — HYDROMORPHONE HCL 1 MG/ML IJ SOLN
1.0000 mg | Freq: Once | INTRAMUSCULAR | Status: AC
  Administered 2023-05-15: 1 mg via INTRAVENOUS
  Filled 2023-05-15: qty 1

## 2023-05-15 MED ORDER — ACETAMINOPHEN 325 MG PO TABS: 650.0000 mg | ORAL_TABLET | Freq: Four times a day (QID) | ORAL | Status: DC | PRN

## 2023-05-15 MED ORDER — INSULIN ASPART 100 UNIT/ML IJ SOLN
0.0000 [IU] | Freq: Three times a day (TID) | INTRAMUSCULAR | Status: DC
  Administered 2023-05-15 (×2): 11 [IU] via SUBCUTANEOUS
  Administered 2023-05-15: 15 [IU] via SUBCUTANEOUS
  Administered 2023-05-16 (×2): 20 [IU] via SUBCUTANEOUS
  Administered 2023-05-16 – 2023-05-17 (×2): 15 [IU] via SUBCUTANEOUS
  Administered 2023-05-17: 11 [IU] via SUBCUTANEOUS
  Administered 2023-05-17: 20 [IU] via SUBCUTANEOUS
  Administered 2023-05-18: 17 [IU] via SUBCUTANEOUS
  Administered 2023-05-18: 7 [IU] via SUBCUTANEOUS
  Administered 2023-05-18: 11 [IU] via SUBCUTANEOUS
  Administered 2023-05-19: 20 [IU] via SUBCUTANEOUS
  Administered 2023-05-19: 7 [IU] via SUBCUTANEOUS
  Administered 2023-05-19: 15 [IU] via SUBCUTANEOUS
  Administered 2023-05-20 (×2): 11 [IU] via SUBCUTANEOUS
  Administered 2023-05-20 – 2023-05-22 (×6): 20 [IU] via SUBCUTANEOUS
  Administered 2023-05-22: 11 [IU] via SUBCUTANEOUS
  Administered 2023-05-23: 15 [IU] via SUBCUTANEOUS
  Administered 2023-05-23: 20 [IU] via SUBCUTANEOUS
  Administered 2023-05-23: 15 [IU] via SUBCUTANEOUS

## 2023-05-15 MED ORDER — MOMETASONE FURO-FORMOTEROL FUM 200-5 MCG/ACT IN AERO
2.0000 | INHALATION_SPRAY | Freq: Two times a day (BID) | RESPIRATORY_TRACT | Status: DC
  Administered 2023-05-15 – 2023-05-21 (×6): 2 via RESPIRATORY_TRACT
  Filled 2023-05-15: qty 8.8

## 2023-05-15 MED ORDER — NITROGLYCERIN 0.4 MG SL SUBL
0.4000 mg | SUBLINGUAL_TABLET | SUBLINGUAL | Status: DC | PRN
  Administered 2023-05-15 – 2023-05-18 (×3): 0.4 mg via SUBLINGUAL
  Filled 2023-05-15 (×3): qty 1

## 2023-05-15 MED ORDER — POTASSIUM CHLORIDE CRYS ER 20 MEQ PO TBCR: 40.0000 meq | EXTENDED_RELEASE_TABLET | Freq: Once | ORAL | Status: DC

## 2023-05-15 MED ORDER — HYDROMORPHONE HCL 2 MG PO TABS
1.0000 mg | ORAL_TABLET | Freq: Once | ORAL | Status: AC
  Administered 2023-05-15: 1 mg via ORAL
  Filled 2023-05-15: qty 1

## 2023-05-15 MED ORDER — ACETAMINOPHEN 325 MG PO TABS
325.0000 mg | ORAL_TABLET | ORAL | Status: DC | PRN
  Administered 2023-05-15: 325 mg via ORAL
  Filled 2023-05-15: qty 1

## 2023-05-15 MED ORDER — TORSEMIDE 20 MG PO TABS
100.0000 mg | ORAL_TABLET | Freq: Every day | ORAL | Status: DC
  Administered 2023-05-15 – 2023-05-16 (×2): 100 mg via ORAL
  Filled 2023-05-15 (×2): qty 5

## 2023-05-15 MED ORDER — ACETAMINOPHEN 650 MG RE SUPP: 650.0000 mg | Freq: Four times a day (QID) | RECTAL | Status: DC | PRN

## 2023-05-15 MED ORDER — PANTOPRAZOLE SODIUM 40 MG PO TBEC
40.0000 mg | DELAYED_RELEASE_TABLET | Freq: Every day | ORAL | Status: DC
  Administered 2023-05-15 – 2023-05-28 (×12): 40 mg via ORAL
  Filled 2023-05-15 (×13): qty 1

## 2023-05-15 NOTE — Progress Notes (Signed)
FMTS Brief Progress Note  S: Messaged during sign out about recurrent chest pain for patient. Seen at bedside with Dr. Velna Ochs. Patient comfortable and face-timing friend. She states that she feels she still has fluid on her and not peeing a lot. She has gotten out about 800 UOP today charted. She is on [place] and saturating around 90. States when laying back in bed she feels short of breath. Chest pain is similar to after her cath and throughout this admission. States it is on the left side of her chest. Not worse with deep breaths.    O: BP (!) 163/76 (BP Location: Right Wrist)   Pulse (!) 104   Temp 98.1 F (36.7 C) (Oral)   Resp 19   Ht 5\' 2"  (1.575 m)   Wt (!) 150 kg   SpO2 100%   BMI 60.48 kg/m    General: Well appearing, NAD, awake, alert, responsive to questions Head: Normocephalic atraumatic CV: Regular rate and rhythm, difficult exam given habitus Respiratory: Clear to ausculation bilaterally, no wheezes rales or crackles, chest rises symmetrically,  no increased work of breathing on [place], no anterior chest wall tenderness Abdomen: Soft, non-tender Extremities: RLE trace edema, LLE BKA  A/P: Chest pain EKG reviewed similar to prior-sinus tachycardia overall within normal limits no signs of ischemia or infarction. UDS + for cocaine, possible contributor. Chest pain similar to what she has had on admission and throughout stay as well.  Will obtain repeat troponin.  Given nitro x 1.  Still has some chest pain. Overall appears comfortable on my exam. Will give one time dose of dilaudid given continued pain after nitro and some ?hypotension after administration. BP appropriate and patient asymptomatic when I was in room. Received 100 mg torsemide this morning. Heart failure team to see tomorrow morning. Chest CTAB when auscultating however difficult due to body habitus. - troponin ordered, follow this up - oral dilaudid 1 mg x 1 given renal function, monitor tonight - consult HF team  tomorrow AM - Orders reviewed. Labs for AM ordered, which was adjusted as needed.   OSA Emphasized importance of using BiPAP tonight.  Levin Erp, MD 05/15/2023, 8:25 PM PGY-2, [facility] Night Resident  Please page [phone] with questions.

## 2023-05-15 NOTE — ED Notes (Signed)
Patient sleeping this morning, CPAP is on and functioning, increased patients O2 for oxygen sats of 87-88%, RT notified, no s/s of distress, will continue to monitor.

## 2023-05-15 NOTE — Assessment & Plan Note (Addendum)
Morbidly obese patient on 3-4 L Venturia at baseline. pCOs on admission is 67 and on exam patient is alert and oriented x4. Will continue oxygen supplementation and BiPAP nightly -Continue Oxygen supplement -BiPAP nightly

## 2023-05-15 NOTE — ED Notes (Signed)
Patient has been on the phone, asked someone to bring her a salad, and maybe " a little something something", usnure of what this item may be. Notified floor nurse.

## 2023-05-15 NOTE — ED Notes (Signed)
ED TO INPATIENT HANDOFF REPORT  ED Nurse Name and Phone #: Rulon Eisenmenger Name/Age/Gender [name] 47 y.o. female Room/Bed: 039C/039C  Code Status   Code Status: Full Code  Home/SNF/Other Home Patient oriented to: self, place, time, and situation Is this baseline? Yes   Triage Complete: Triage complete  Chief Complaint Chest pain [R07.9]  Triage Note Pt to ED via [facility] from home c/o left chest pain and SOB.  Had heart cath done Thursday with fluid around heart, told stage 3 heart failure.  Also had renal failure, COPD and DBM. Had pain to chest after procedure but states pain to chest is worse tonight and is dull and radiates to back, non productive cough, chronic 3-4L [place] at home but placed on NRB 15L with EMS.  EMS gave 324 ASA and 0.4mg  nitro without relief.   Allergies Allergies  Allergen Reactions   Cefepime Other (See Comments)    AKI, see records from [facility] hospitalization in January 2020.   Other reaction(s): Other (See Comments) "Shut down organs/kidneys"  Note pt has tolerated Rocephin and Keflex Other reaction(s): Not available   Iodine Other (See Comments)    Kidney dysfunction   Kiwi Extract Anaphylaxis, Shortness Of Breath and Swelling   Morphine And Codeine Nausea And Vomiting    Can take with antinausea    Pentoxifylline Nausea And Vomiting    Trental   Cephalosporins Other (See Comments)    Pt states that they cause her kidneys to shut down   Toradol [Ketorolac Tromethamine] Other (See Comments)    Feels like something is crawling on me   Nubain [Nalbuphine Hcl] Other (See Comments)    "FEELS LIKE SOMETHING CRAWLING ON ME" Hallucinations     Level of Care/Admitting Diagnosis ED Disposition     ED Disposition  Admit   Condition  --   Comment  Hospital Area: [facility] [[id number]]  Level of Care: Telemetry Medical [[id number]]  May place patient in observation at [facility] or [facility] if equivalent level of care is available::  No  Covid Evaluation: Asymptomatic - no recent exposure (last 10 days) testing not required  Diagnosis: Chest pain [161096]  Admitting Physician: Nestor Ramp [[id number]]  Attending Physician: Nestor Ramp [[id number]]          B Medical/Surgery History Past Medical History:  Diagnosis Date   Abscess of groin, left    Acute on chronic diastolic (congestive) heart failure ([name]) 02/05/2022   Acute on chronic respiratory failure with hypoxia ([name]) 04/11/2022   Acute osteomyelitis of ankle or foot, left ([name]) 01/31/2018   Acute respiratory failure with hypoxia and hypercarbia ([name]) 06/15/2020   Acute tubular necrosis ([name]) 07/12/2022   Alveolar hypoventilation    Amputation stump infection ([name]) 04/09/2018   Anemia    not on iron pill   Asthma    Bipolar 2 disorder ([name])    Candidiasis of vagina 05/15/2022   Carpal tunnel syndrome on right    recurrent   Cellulitis 08/2010-08/2011   Chest pain with low risk for cardiac etiology 05/11/2021   Chronic pain    COPD (chronic obstructive pulmonary disease) ([name])    Symbicort daily and Proventil as needed   Costochondritis    De Quervain's tenosynovitis, bilateral 11/01/2015   Depression    Diabetes mellitus type II, uncontrolled 2000   Type 2, Uncontrolled.Takes Lantus daily.Fasting blood sugar runs 150   Diabetic ulcer of right foot ([name]) 12/19/2021   Drug-seeking behavior  Elevated troponin    Exposure to mold 04/21/2020   Family history of metabolic acidosis with increased anion gap 04/12/2023   GERD (gastroesophageal reflux disease)    takes Pantoprazole and Zantac daily   HLD (hyperlipidemia)    takes Atorvastatin daily   Hypertension    takes Lisinopril and Coreg daily   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[name]) 04/11/2019   Left shoulder pain 06/23/2019   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  Mild diffuse proximal LAD stenosis with no evidence of obstructive disease (20 to 30% diffuse stenosis);  2.  Left dominant circumflex with widely patent obtuse marginal branches and left PDA branch, no significant stenoses present  3.  Nondominant RCA with mild diffuse nonobstructive stenosis    OSA on CPAP    Peripheral neuropathy    takes Gabapentin daily   Pneumonia    "walking" several yrs ago and as a baby (12/05/2018)   Pneumonia due to COVID-19 virus 04/14/2021   Primary osteoarthritis of first carpometacarpal joint of left hand 07/30/2016   Rectal fissure    Restless leg    Right carpal tunnel syndrome 09/01/2011   SVT (supraventricular tachycardia)    Syncope 02/25/2016   Thrombocytosis 04/11/2022   Urinary frequency    Urinary incontinence 10/23/2020   UTI (urinary tract infection) 04/15/2021   Varicose veins    Right medial thigh and Left leg    Past Surgical History:  Procedure Laterality Date   AMPUTATION Left 02/01/2018   Procedure: LEFT FOURTH AND 5TH TOE RAY AMPUTATION;  Surgeon: Nadara Mustard, MD;  Location: [facility] OR;  Service: Orthopedics;  Laterality: Left;   AMPUTATION Left 03/03/2018   Procedure: LEFT BELOW KNEE AMPUTATION;  Surgeon: Nadara Mustard, MD;  Location: [facility] OR;  Service: Orthopedics;  Laterality: Left;   AMPUTATION Right 05/21/2022   Procedure: REVISION AMPUTATION RAY 1ST;  Surgeon: Candelaria Stagers, DPM;  Location: [facility] OR;  Service: Podiatry;  Laterality: Right;   CARPAL TUNNEL RELEASE Bilateral    CESAREAN SECTION  2007   CORONARY ANGIOGRAPHY N/A 11/26/2020   Procedure: CORONARY ANGIOGRAPHY;  Surgeon: Tonny Bollman, MD;  Location: [facility] INVASIVE CV LAB;  Service: Cardiovascular;  Laterality: N/A;   FOOT AMPUTATION Right    INCISION AND DRAINAGE ABSCESS Left 10/16/2021   Procedure: INCISION AND DRAINAGE ABSCESS;  Surgeon: Fritzi Mandes, MD;  Location: [facility] OR;  Service: General;  Laterality: Left;   INCISION  AND DRAINAGE PERIRECTAL ABSCESS Left 05/18/2019   Procedure: IRRIGATION AND DEBRIDEMENT OF PANNIS ABSCESS, POSSIBLE DEBRIDEMENT OF BUTTOCK WOUND;  Surgeon: Manus Rudd, MD;  Location: [facility] OR;  Service: General;  Laterality: Left;   IRRIGATION AND DEBRIDEMENT BUTTOCKS Left 05/17/2019   Procedure: IRRIGATION AND DEBRIDEMENT BUTTOCKS;  Surgeon: Manus Rudd, MD;  Location: [facility] OR;  Service: General;  Laterality: Left;   KNEE ARTHROSCOPY Right 07/17/2010   LEFT HEART CATHETERIZATION WITH CORONARY ANGIOGRAM N/A 07/27/2012   Procedure: LEFT HEART CATHETERIZATION WITH CORONARY ANGIOGRAM;  Surgeon: Tonny Bollman, MD;  Location: [facility] CATH LAB;  Service: Cardiovascular;  Laterality: N/A;   MASS EXCISION N/A 06/29/2013   Procedure:  WIDE LOCAL EXCISION OF POSTERIOR NECK ABSCESS;  Surgeon: Axel Filler, MD;  Location: [facility] OR;  Service: General;  Laterality: N/A;   REPAIR KNEE LIGAMENT Left    "fixed ligaments and chipped  patella"   RIGHT HEART CATH N/A 05/12/2023   Procedure: RIGHT HEART CATH;  Surgeon: Laurey Morale, MD;  Location: [facility] INVASIVE CV LAB;  Service: Cardiovascular;  Laterality: N/A;   right transmetatarsal amputation      TRACHEOSTOMY TUBE PLACEMENT N/A 07/10/2022   Procedure: CONTROLLED EXTUBATION;  Surgeon: Serena Colonel, MD;  Location: [facility] OR;  Service: ENT;  Laterality: N/A;     A IV Location/Drains/Wounds Patient Lines/Drains/Airways Status     Active Line/Drains/Airways     Name Placement date Placement time Site Days   Peripheral IV 05/14/23 20 G Anterior;Left Forearm 05/14/23  2241  Forearm  1   Pressure Injury 03/19/23 Heel Right Stage 1 -  Intact skin with non-blanchable redness of a localized area usually over a bony prominence. 03/19/23  1745  -- 57   Wound / Incision (Open or Dehisced) 03/19/23 (ITD) Intertriginous Dermatitis Abdomen Lower;Bilateral 03/19/23  1630  Abdomen  57   Wound / Incision (Open or Dehisced) 03/19/23 Non-pressure wound Axilla Right per patient report:  ruptured boil 03/19/23  1630  Axilla  57            Intake/Output Last 24 hours  Intake/Output Summary (Last 24 hours) at 05/15/2023 1501 Last data filed at 05/15/2023 1500 Gross per 24 hour  Intake --  Output 700 ml  Net -700 ml    Labs/Imaging Results for orders placed or performed during the hospital encounter of 05/14/23 (from the past 48 hour(s))  CBC     Status: Abnormal   Collection Time: 05/14/23 10:40 PM  Result Value Ref Range   WBC 8.6 4.0 - 10.5 K/uL   RBC 3.81 (L) 3.87 - 5.11 MIL/uL   Hemoglobin 11.2 (L) 12.0 - 15.0 g/dL   HCT 16.1 (L) 09.6 - 04.5 %   MCV 91.3 80.0 - 100.0 fL   MCH 29.4 26.0 - 34.0 pg   MCHC 32.2 30.0 - 36.0 g/dL   RDW 40.9 (H) 81.1 - 91.4 %   Platelets 329 150 - 400 K/uL   nRBC 0.2 0.0 - 0.2 %    Comment: Performed at [facility] Lab, [address], [place], [place] [address]  Troponin I (High Sensitivity)     Status: Abnormal   Collection Time: 05/14/23 10:40 PM  Result Value Ref Range   Troponin I (High Sensitivity) 43 (H) <18 ng/L    Comment: (NOTE) Elevated high sensitivity troponin I (hsTnI) values and significant  changes across serial measurements may suggest ACS but many other  chronic and acute conditions are known to elevate hsTnI results.  Refer to the "Links" section for chest pain algorithms and additional  guidance. Performed at [facility] Lab, [address] [place], [place] [address]   Brain natriuretic peptide     Status: None   Collection Time: 05/14/23 10:40 PM  Result Value Ref Range   B Natriuretic Peptide 19.3 0.0 - 100.0 pg/mL    Comment: Performed at [facility], [address] [place], [place] [address]  Basic metabolic panel     Status: Abnormal   Collection Time: 05/14/23 10:40 PM  Result Value Ref Range   Sodium 137 135 - 145 mmol/L   Potassium 3.2 (L) 3.5 - 5.1 mmol/L   Chloride 85 (L) 98 - 111 mmol/L   CO2 34 (H) 22 - 32 mmol/L   Glucose, Bld 243 (H) 70 - 99 mg/dL    Comment: Glucose  reference range applies only to samples  taken after fasting for at least 8 hours.   BUN 143 (H) 6 - 20 mg/dL   Creatinine, Ser 4.09 (H) 0.44 - 1.00 mg/dL   Calcium 9.2 8.9 - 81.1 mg/dL   GFR, Estimated 28 (L) >60 mL/min    Comment: (NOTE) Calculated using the CKD-EPI Creatinine Equation (2021)    Anion gap 18 (H) 5 - 15    Comment: Performed at [facility], [address], [place], [place] [address]  I-Stat beta hCG blood, ED     Status: None   Collection Time: 05/14/23 11:01 PM  Result Value Ref Range   I-stat hCG, quantitative <5.0 <5 mIU/mL   Comment 3            Comment:   GEST. AGE      CONC.  (mIU/mL)   <=1 WEEK        5 - 50     2 WEEKS       50 - 500     3 WEEKS       100 - 10,000     4 WEEKS     1,000 - 30,000        FEMALE AND NON-PREGNANT FEMALE:     LESS THAN 5 mIU/mL   Blood gas, venous     Status: Abnormal   Collection Time: 05/14/23 11:55 PM  Result Value Ref Range   pH, Ven 7.36 7.25 - 7.43   pCO2, Ven 67 (H) 44 - 60 mmHg   pO2, Ven 49 (H) 32 - 45 mmHg   Bicarbonate 37.9 (H) 20.0 - 28.0 mmol/L   Acid-Base Excess 9.7 (H) 0.0 - 2.0 mmol/L   O2 Saturation 82.9 %   Patient temperature 37.0    Collection site VEIN     Comment: Performed at [facility], [address], [place], [place] [address]  Troponin I (High Sensitivity)     Status: Abnormal   Collection Time: 05/15/23  1:00 AM  Result Value Ref Range   Troponin I (High Sensitivity) 44 (H) <18 ng/L    Comment: (NOTE) Elevated high sensitivity troponin I (hsTnI) values and significant  changes across serial measurements may suggest ACS but many other  chronic and acute conditions are known to elevate hsTnI results.  Refer to the "Links" section for chest pain algorithms and additional  guidance. Performed at [facility], [address], [place], [place] [address]   Urine rapid drug screen (hosp performed)     Status: Abnormal   Collection Time: 05/15/23  1:35 AM  Result Value Ref  Range   Opiates NONE DETECTED NONE DETECTED   Cocaine POSITIVE (A) NONE DETECTED   Benzodiazepines NONE DETECTED NONE DETECTED   Amphetamines NONE DETECTED NONE DETECTED   Tetrahydrocannabinol NONE DETECTED NONE DETECTED   Barbiturates NONE DETECTED NONE DETECTED    Comment: (NOTE) DRUG SCREEN FOR MEDICAL PURPOSES ONLY.  IF CONFIRMATION IS NEEDED FOR ANY PURPOSE, NOTIFY LAB WITHIN 5 DAYS.  LOWEST DETECTABLE LIMITS FOR URINE DRUG SCREEN Drug Class                     Cutoff (ng/mL) Amphetamine and metabolites    1000 Barbiturate and metabolites    200 Benzodiazepine                 200 Opiates and metabolites        300 Cocaine and metabolites        300  THC                            50 Performed at [facility] Lab, [address] [place], [place] [address]   Basic metabolic panel     Status: Abnormal   Collection Time: 05/15/23  5:16 AM  Result Value Ref Range   Sodium 135 135 - 145 mmol/L   Potassium 3.7 3.5 - 5.1 mmol/L   Chloride 88 (L) 98 - 111 mmol/L   CO2 32 22 - 32 mmol/L   Glucose, Bld 244 (H) 70 - 99 mg/dL    Comment: Glucose reference range applies only to samples taken after fasting for at least 8 hours.   BUN 146 (H) 6 - 20 mg/dL   Creatinine, Ser 3.08 (H) 0.44 - 1.00 mg/dL   Calcium 8.8 (L) 8.9 - 10.3 mg/dL   GFR, Estimated 23 (L) >60 mL/min    Comment: (NOTE) Calculated using the CKD-EPI Creatinine Equation (2021)    Anion gap 15 5 - 15    Comment: Performed at [facility] Lab, [address], [place], [place] [address]  CBG monitoring, ED     Status: Abnormal   Collection Time: 05/15/23  9:48 AM  Result Value Ref Range   Glucose-Capillary 273 (H) 70 - 99 mg/dL    Comment: Glucose reference range applies only to samples taken after fasting for at least 8 hours.  CBG monitoring, ED     Status: Abnormal   Collection Time: 05/15/23 12:12 PM  Result Value Ref Range   Glucose-Capillary 272 (H) 70 - 99 mg/dL    Comment: Glucose reference range  applies only to samples taken after fasting for at least 8 hours.   *Note: Due to a large number of results and/or encounters for the requested time period, some results have not been displayed. A complete set of results can be found in Results Review.   DG Chest 2 View  Result Date: 05/14/2023 CLINICAL DATA:  Left-sided chest pain. EXAM: CHEST - 2 VIEW COMPARISON:  None Available. FINDINGS: The heart size and mediastinal contours are within normal limits. Prominence of the perihilar pulmonary vasculature is seen. Low lung volumes are noted. There is no evidence of an acute infiltrate, pleural effusion or pneumothorax. The visualized skeletal structures are unremarkable. IMPRESSION: Low lung volumes with mild pulmonary vascular congestion. Electronically Signed   By: Aram Candela M.D.   On: 05/14/2023 23:51    Pending Labs Unresulted Labs (From admission, onward)     Start     Ordered   05/16/23 0500  CBC  Tomorrow morning,   R        05/15/23 1109   05/16/23 0500  Basic metabolic panel  Tomorrow morning,   R        05/15/23 1109   05/16/23 0500  Magnesium  Tomorrow morning,   R        05/15/23 1109            Vitals/Pain Today's Vitals   05/15/23 1000 05/15/23 1150 05/15/23 1410 05/15/23 1411  BP: (!) 140/85  (!) 146/75   Pulse: 94   (!) 105  Resp: 15  15 14   Temp:  (!) 97.5 F (36.4 C)    TempSrc:      SpO2: 99%   98%  Weight:      Height:      PainSc:  Isolation Precautions No active isolations  Medications Medications  amLODipine (NORVASC) tablet 10 mg (10 mg Oral Not Given 05/15/23 1153)  ipratropium-albuterol (DUONEB) 0.5-2.5 (3) MG/3ML nebulizer solution 3 mL (has no administration in time range)  albuterol (PROVENTIL) (2.5 MG/3ML) 0.083% nebulizer solution 2.5 mg (has no administration in time range)  enoxaparin (LOVENOX) injection 40 mg (40 mg Subcutaneous Given 05/15/23 1351)  insulin aspart (novoLOG) injection 0-20 Units (11 Units Subcutaneous Given  05/15/23 1220)  insulin glargine-yfgn (SEMGLEE) injection 40 Units (40 Units Subcutaneous Given 05/15/23 1000)  potassium chloride SA (KLOR-CON M) CR tablet 40 mEq (40 mEq Oral Patient Refused/Not Given 05/15/23 0416)  aspirin EC tablet 81 mg (has no administration in time range)  acetaminophen (TYLENOL) tablet 325 mg (has no administration in time range)  brinzolamide (AZOPT) 1 % ophthalmic suspension 1 drop (1 drop Both Eyes Not Given 05/15/23 1134)    And  brimonidine (ALPHAGAN) 0.2 % ophthalmic solution 1 drop (1 drop Both Eyes Not Given 05/15/23 1135)  mometasone-formoterol (DULERA) 200-5 MCG/ACT inhaler 2 puff (2 puffs Inhalation Not Given 05/15/23 1136)  gabapentin (NEURONTIN) capsule 600 mg (has no administration in time range)  DULoxetine (CYMBALTA) DR capsule 120 mg (120 mg Oral Not Given 05/15/23 1151)  latanoprost (XALATAN) 0.005 % ophthalmic solution 1 drop (1 drop Both Eyes Not Given 05/15/23 1135)  metolazone (ZAROXOLYN) tablet 5 mg (has no administration in time range)  nitroGLYCERIN (NITROSTAT) SL tablet 0.4 mg (has no administration in time range)  pantoprazole (PROTONIX) EC tablet 40 mg (40 mg Oral Given 05/15/23 1151)  prednisoLONE acetate (PRED FORTE) 1 % ophthalmic suspension 1 drop (1 drop Both Eyes Not Given 05/15/23 1426)  Netarsudil Dimesylate 0.02 % SOLN 1 drop (1 drop Left Eye Not Given 05/15/23 1136)  rOPINIRole (REQUIP) tablet 1 mg (has no administration in time range)  rosuvastatin (CRESTOR) tablet 10 mg (10 mg Oral Given 05/15/23 1153)  torsemide (DEMADEX) tablet 100 mg (100 mg Oral Given 05/15/23 1151)  HYDROmorphone (DILAUDID) injection 1 mg (1 mg Intravenous Given 05/14/23 2253)  ondansetron (ZOFRAN) injection 4 mg (4 mg Intravenous Given 05/14/23 2253)  ipratropium-albuterol (DUONEB) 0.5-2.5 (3) MG/3ML nebulizer solution 3 mL (3 mLs Nebulization Given 05/14/23 2306)  albuterol (PROVENTIL) (2.5 MG/3ML) 0.083% nebulizer solution 2.5 mg (2.5 mg Nebulization Given 05/15/23 0105)  furosemide  (LASIX) injection 60 mg (60 mg Intravenous Given 05/15/23 0156)  HYDROmorphone (DILAUDID) injection 1 mg (1 mg Intravenous Given 05/15/23 0156)    Mobility non-ambulatory     Focused Assessments Pulmonary Assessment Handoff:  Lung sounds: Bilateral Breath Sounds: Diminished O2 Device: (S) High Flow Nasal Cannula O2 Flow Rate (L/min): 10 L/min    R Recommendations: See Admitting Provider Note  Report given to:   Additional Notes: Pt is AOX4, talky, on the purewick, has an left AKA and partal amputation to the right foot, her O2 drops significantly without O2, obese

## 2023-05-15 NOTE — H&P (Signed)
Hospital Admission History and Physical Service Pager: [phone]  Patient name: [name] Medical record number: [id number] Date of Birth: 01-31-1976 Age: 47 y.o. Gender: female  Primary Care Provider: Latrelle Dodrill, MD Consultants: None Code Status: Full Preferred Emergency Contact:  Contact Information     Name Relation Home Work Mobile   Belinda, Faurot Hall) Daughter   [phone]   [name]   [phone]        Chief Complaint: Chest pain  Assessment and Plan: [name] is a 47 y.o. female presenting with chest pain. Differential for presentation of this includes coronary vasospasm in setting of cocaine use (most likely), MI unlikely with reassuring EKG and flat troponins, PE also unlikely as she is breathing comfortably on home O2, Aortic dissection unlikely, no radiation to back and comparable pulse on both RUE and LUE.  Active Hospital Problems   *Chest pain           Suspect patient's chest pain to be most likely due           to vasospasm are most likely due to cocaine use           (UDS positive for cocaine).  Although not           completely ruled out low concern for ACS given EKG           and troponin levels.  Intermediate risk with ASCVD           risk score of 19.5% possibly due to vasospasms in           setting of cocaine use .  Also given recent           cardiac procedure (RHC on 6/6) have low threshold           to consult cardiology           -Admit to FMTS, attending Dr. Jennette Kettle           -Monitor chest pain           - continuous cardiac monitoring           -s/p Dilaudid x 2 (in the ED)           -Encourage cocaine cessation           -Consider cardiology consult    Type 2 diabetes mellitus with hyperglycemia ([name])           Poorly controlled DM with most recent A1c of 13.5           a month ago. CBG on admission over 200. Home meds           include Tresiba 70 units twice daily and            NovoLog  3 times  daily           -Semglee 40 units BID, adjust as needed           -Resistant SSI with CBGs before every meal and           nightly           -Placed on [facility]/ Carb Modified diet    Hypokalemia           K on admission was 3.2.  The EKG was unremarkable.           Will replete with KCL.           -Replete with KCL           -  Monitor with daily BMP    Obstructive sleep apnea (adult) (pediatric)        Chronic           Morbidly obese the patient is on 3-4 L [place] at           baseline. pCOs on admission is 67 and on exam           patient is alert and oriented x4. Will continue           oxygen            oxygen supplementation BiPAP nightly           -Continue Oxygen supplement           -BiPAP nightly               Azotemia           Hospitalized 2x  last month, first for uremia with           unclear etiology and then for AKI on CKD 4 which           improved with IV fluids and decreasing torsemide           and metolazone.  Discharged with plans to           follow-up with nephrology outpatient. BUN remains           elevated to 143 and she is asymptomatic.           -A.m. BMP           -Consider consulting nephrology while hospitalized    Chronic diastolic heart failure ([name])           Last echo 3/24 with EF 55-60%.  Follows with           cardiology.  Had [facility] on 6/6 showing pulmonary           hypertension, RV failure.  Home meds include           torsemide 100 mg in the morning and 50 mg in the           evening, metolazone 5 mg 3 days/week.  Difficult           to assess volume status due to body habitus but           suspect abdominal fluid retain given  tightness on           exam.  CXR shows mild vascular congestion. s/p           Lasix 60 mg IV in ED.           -S/p 60 units IV Lasix in the ED           -Reassess volume status in the morning, redose IV           Lasix vs start home            torsemide           -Strict I/os           -Daily  weights              Resolved Hospital Problems No resolved problems to display.   FEN/GI: Carb modified VTE Prophylaxis: Lovenox  Disposition: MedSurg  History of Present Illness:  [name] is a 47 y.o. female presenting with Chest pain  Chest pain started 2 days ago.  Patient had right heart cath with cardiologist on  Thursday and on Friday she started noticing intermittent chest pain that spontaneously resolved.  However on Saturday pain progressively got worse and became constant.  Describes chest pain as pressure-like substernal chest pain that's is occasionally sharp.  Pain initially was not radiating but had 1 episode in the ED that radiated to her left arm. Pain is not associated with activity. Nothing has improved the chest pain other than Dilaudid which she received in the ED. Patient endorses recent use of cocaine earlier this week.  Per patient she was recently informed by her cardiologist that she does have of pulmonary hypertension and was told to increase her torsemide.  Patient reports she has noticed some abdominal edema and tightness.  She also reports decreased urinary output despite being on torsemide.  Have chronic shortness of breath and not significantly changed currently.  In ED patient initially placed on 15 L/min on NRB but quickly weaned down to home 4 L nasal cannula with O2 saturations in high 90s. VSS.  CXR showing mild pulmonary vascular congestion, BNP WNL.  VBG showing baseline hypercarbia, normal pH.  BMP with potassium 3.2, creatinine 2.19, BUN 143. EKG and trop (43>44) non concerning for MI.  She was treated with albuterol nebulizer, Lasix 60 mg IV, and Dilaudid.   Review Of Systems: Per HPI  Pertinent Past Medical History: T2DM HTN COPD HTN OHS OSA HFpEF HLD CAD Remainder reviewed in history tab.   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   Pertinent Social History: Tobacco use:  Former Alcohol use: Denies Other Substance use: Cocaine - last used a couple days ago   Pertinent Family History: Remainder reviewed in history tab.   Important Outpatient Medications: Remainder reviewed in medication history.   Objective: BP (!) 138/90 (BP Location: Left Arm)   Pulse 96   Temp 98.1 F (36.7 C) (Oral)   Resp 20   Ht 5\' 2"  (1.575 m)   Wt (!) 150 kg   SpO2 96%   BMI 60.48 kg/m  Exam: General: Alert, well appearing, NAD CV: RRR, no murmurs, normal S1/S2 Pulm: CTAB, good WOB on 4L [place], no crackles or wheezing Abd: Tight, no distension, no tenderness Skin: dry, warm Ext: No BLE edema, LLE BKA., RLE TMA.  Neuro: A&O x3, No focal deficit     Labs:  CBC BMET  Recent Labs  Lab 05/14/23 2240  WBC 8.6  HGB 11.2*  HCT 34.8*  PLT 329   Recent Labs  Lab 05/14/23 2240  NA 137  K 3.2*  CL 85*  CO2 34*  BUN 143*  CREATININE 2.19*  GLUCOSE 243*  CALCIUM 9.2    Pertinent additional labs  UDS : + cocaine Troponin: 43 >44 pCO2: 67  .  EKG: Sinus tachycardia with prolonged QT.   Imaging Studies Performed: DG Chest 2 View Low lung volumes with mild pulmonary vascular congestion.    Jerre Simon, MD 05/15/2023, 4:06 AM PGY-2, [facility]  FPTS Intern pager: [phone], text pages welcome Secure chat group [facility] Teaching Service

## 2023-05-15 NOTE — Hospital Course (Addendum)
[name] is a 47 y.o.female with a history of T2DM, HTN, COPD, OSA, OHS, HTN, HFpEF, HLD, CAD  who was admitted to the [facility] Medicine Teaching Service at [facility] for chest pain after recent heart cath. Her hospital course is detailed below:  Chest Pain  Heart failure Patient had right heart catheterization on 6/6 patient to assess heart failure and pulmonary artery hypertension. Was found to have elevated R>L heart filling pressures, mixed PV/PAH, low cardiac output + low PAPi suspected 2/2 to RV failure.  Patient admitted for observation.  UDS positive for cocaine.  EKG without ischemic changes.  Troponin flat 43>44.  BNP of 19.3. In ED patient initially placed on 15 L/min on NRB but quickly weaned down to home 4 L nasal cannula with O2 saturations in high 90s. CXR showing mild pulmonary vascular congestion, BNP WNL.  VBG showing baseline hypercarbia, normal pH. Was given IV diuresis and pain control.  Chest pain thought to be related to vasospasm from cocaine use.   Cardiology also recommended right heart cath.  This was performed on 6/17 and showed RV failure, elevated PA mean and PVR. Patient was briefly on milrinone for RV support but this did not result in much improvement, so it was weaned off. Cardiology noted that there were no more interventions to support her heart function. She was discharged home on hospice.   Azotemia Hospitalized twice within the last month due to uremia and AKI on CKD 4.  BUN remained elevated at 143 however patient was asymptomatic.  Patient underwent aggressive diuresis per nephrology.  She received acetazolamide during her stay for metabolic alkalosis. She was then started on HD. HD was was unsuccessful in improving her renal function, thought to be failing 2/2 volume overload via right heart failure. Upon discharge patient decided she did not want to continue dialysis. She was discharged home on hospice.  T2DM Patient's blood sugars were elevated during her stay.   Her insulin was titrated until her blood glucoses were stable, she was sent home on her home regimen.  Other chronic conditions were medically managed with home medications and formulary alternatives as necessary (T2DM, OSA)

## 2023-05-15 NOTE — Progress Notes (Signed)
   05/15/23 2339  BiPAP/CPAP/SIPAP  $ Non-Invasive Ventilator  Non-Invasive Vent Set Up  BiPAP/CPAP/SIPAP Pt Type Adult  BiPAP/CPAP/SIPAP Resmed  EPAP 9 cmH2O  Flow Rate 4 lpm

## 2023-05-15 NOTE — Assessment & Plan Note (Addendum)
Suspect patient's chest pain to be most likely due to vasospasm in the setting of cocaine use (UDS positive for cocaine).  Although not completely ruled out low concern for ACS given EKG and troponin levels.  Intermediate ASCVD risk score of 19.5%. Given recent cardiac procedure (RHC on 6/6) low threshold to consult cardiology. Will monitor patient closely with continuous cardiac monitoring -Admit to FMTS, attending Dr. Jennette Kettle -Monitor chest pain - continuous cardiac monitoring -s/p Dilaudid x 2 (in the ED) -Encourage cocaine cessation -Consider cardiology consult

## 2023-05-15 NOTE — ED Notes (Signed)
Visitor for pt brought her outside food

## 2023-05-15 NOTE — Assessment & Plan Note (Addendum)
Hospitalized 2x  last month, first for uremia with unclear etiology and then for AKI on CKD 4 which improved with IV fluids and decreasing torsemide and metolazone.  Discharged with plans to follow-up with nephrology outpatient. BUN remains elevated to 143 and she is asymptomatic. Creatine is 2.19 and seems to have a new baseline around 2.25 in the last month. Suspect worsening kidney function with GFR of 28. -A.m. BMP -Consider consulting nephrology while hospitalized

## 2023-05-15 NOTE — ED Notes (Signed)
Pt c/o worse CP than when she arrived to left chest radiating to left arm, new EKG captured and EDP notified.

## 2023-05-15 NOTE — Assessment & Plan Note (Addendum)
Last echo 3/24 with EF 55-60%.  Follows with cardiology.  Had [facility] on 6/6 showing pulmonary hypertension, RV failure.  Home meds include torsemide 100 mg in the morning and 50 mg in the evening, metolazone 5 mg 3 days/week.  Difficult to assess volume status due to body habitus but suspect abdominal fluid retain given  tightness on exam.  CXR shows mild vascular congestion. s/p Lasix 60 mg IV in ED. -S/p 60 units IV Lasix in the ED -Reassess volume status in the morning, redose IV Lasix vs start home torsemide -Strict I/os -Daily weights

## 2023-05-15 NOTE — Assessment & Plan Note (Addendum)
Poorly controlled DM with most recent A1c of 13.5 a month ago. CBG on admission over 200. Home meds include Tresiba 70 units twice daily and NovoLog  3 times daily -Semglee 40 units BID, adjust as needed -Resistant SSI with CBGs before every meal and nightly -Placed on [facility]/ Carb Modified diet

## 2023-05-15 NOTE — Assessment & Plan Note (Signed)
K on admission was 3.2.  The EKG was unremarkable. Will replete with KCL. -Replete with KCL -Monitor with daily BMP

## 2023-05-15 NOTE — Progress Notes (Signed)
Pt c/o chest pain 8/10. No s/s observed. Tele NSR /WNL pt taking nebulizer while given 1 nitro. MD made aware. 87 /77 post nitro Reva Bores 05/15/23 7:06 PM

## 2023-05-15 NOTE — Progress Notes (Addendum)
FMTS Interim Progress Note  S: Went to bedside, admitted ON. On Bipap, tolerating well. Awakens to voice, answers all questioning correctly.   O: BP 124/85   Pulse 89   Temp 98.3 F (36.8 C) (Oral)   Resp 13   Ht 5\' 2"  (1.575 m)   Wt (!) 150 kg   SpO2 94%   BMI 60.48 kg/m     A/P: -Consult cardiology today -Spoke to Dr. Diona Browner with heartcare, can be seen by heart failure team in AM given presentation. However, heart failure team is not available for consults over the weekend, so WILL NEED TO CALL THEM FOR CONSULT AGAIN 6/10. If there are any changes, will have cardiology see today.   -Rest per H&P  Alfredo Martinez, MD 05/15/2023, 8:55 AM PGY-2, [facility] Family Medicine Service pager [phone]

## 2023-05-15 NOTE — Progress Notes (Signed)
   05/15/23 0500  BiPAP/CPAP/SIPAP  $ Non-Invasive Ventilator  Non-Invasive Vent Set Up;Non-Invasive Vent Initial  BiPAP/CPAP/SIPAP Pt Type Adult  BiPAP/CPAP/SIPAP Resmed  Mask Type Full face mask  Mask Size  (F20 medium mask from home)  Set Rate 0 breaths/min  Respiratory Rate 16 breaths/min  IPAP 15 cmH20  EPAP 7 cmH2O  Flow Rate 4 lpm  Patient Home Equipment No  Auto Titrate Yes  Nasal massage performed No (comment)  BiPAP/CPAP /SiPAP Vitals  Pulse Rate 97  Resp 11  SpO2 98 %  Bilateral Breath Sounds Diminished  MEWS Score/Color  MEWS Score 1  MEWS Score Color Green

## 2023-05-16 DIAGNOSIS — T801XXA Vascular complications following infusion, transfusion and therapeutic injection, initial encounter: Secondary | ICD-10-CM | POA: Diagnosis not present

## 2023-05-16 DIAGNOSIS — E1165 Type 2 diabetes mellitus with hyperglycemia: Secondary | ICD-10-CM | POA: Diagnosis not present

## 2023-05-16 DIAGNOSIS — I5033 Acute on chronic diastolic (congestive) heart failure: Secondary | ICD-10-CM | POA: Diagnosis not present

## 2023-05-16 DIAGNOSIS — J4489 Other specified chronic obstructive pulmonary disease: Secondary | ICD-10-CM | POA: Diagnosis present

## 2023-05-16 DIAGNOSIS — Z66 Do not resuscitate: Secondary | ICD-10-CM | POA: Diagnosis not present

## 2023-05-16 DIAGNOSIS — E874 Mixed disorder of acid-base balance: Secondary | ICD-10-CM | POA: Diagnosis present

## 2023-05-16 DIAGNOSIS — D631 Anemia in chronic kidney disease: Secondary | ICD-10-CM | POA: Diagnosis not present

## 2023-05-16 DIAGNOSIS — R937 Abnormal findings on diagnostic imaging of other parts of musculoskeletal system: Secondary | ICD-10-CM | POA: Diagnosis not present

## 2023-05-16 DIAGNOSIS — I50811 Acute right heart failure: Secondary | ICD-10-CM | POA: Diagnosis not present

## 2023-05-16 DIAGNOSIS — E119 Type 2 diabetes mellitus without complications: Secondary | ICD-10-CM | POA: Diagnosis not present

## 2023-05-16 DIAGNOSIS — Z7189 Other specified counseling: Secondary | ICD-10-CM | POA: Diagnosis not present

## 2023-05-16 DIAGNOSIS — I130 Hypertensive heart and chronic kidney disease with heart failure and stage 1 through stage 4 chronic kidney disease, or unspecified chronic kidney disease: Secondary | ICD-10-CM | POA: Diagnosis not present

## 2023-05-16 DIAGNOSIS — J9621 Acute and chronic respiratory failure with hypoxia: Secondary | ICD-10-CM | POA: Diagnosis not present

## 2023-05-16 DIAGNOSIS — I2721 Secondary pulmonary arterial hypertension: Secondary | ICD-10-CM | POA: Diagnosis not present

## 2023-05-16 DIAGNOSIS — I519 Heart disease, unspecified: Secondary | ICD-10-CM | POA: Diagnosis not present

## 2023-05-16 DIAGNOSIS — N184 Chronic kidney disease, stage 4 (severe): Secondary | ICD-10-CM | POA: Diagnosis not present

## 2023-05-16 DIAGNOSIS — E1122 Type 2 diabetes mellitus with diabetic chronic kidney disease: Secondary | ICD-10-CM | POA: Diagnosis present

## 2023-05-16 DIAGNOSIS — N179 Acute kidney failure, unspecified: Secondary | ICD-10-CM | POA: Diagnosis not present

## 2023-05-16 DIAGNOSIS — N1832 Chronic kidney disease, stage 3b: Secondary | ICD-10-CM | POA: Diagnosis present

## 2023-05-16 DIAGNOSIS — D649 Anemia, unspecified: Secondary | ICD-10-CM | POA: Diagnosis not present

## 2023-05-16 DIAGNOSIS — I50810 Right heart failure, unspecified: Secondary | ICD-10-CM | POA: Diagnosis not present

## 2023-05-16 DIAGNOSIS — R071 Chest pain on breathing: Secondary | ICD-10-CM | POA: Diagnosis not present

## 2023-05-16 DIAGNOSIS — Z89512 Acquired absence of left leg below knee: Secondary | ICD-10-CM | POA: Diagnosis not present

## 2023-05-16 DIAGNOSIS — R079 Chest pain, unspecified: Secondary | ICD-10-CM | POA: Diagnosis not present

## 2023-05-16 DIAGNOSIS — R072 Precordial pain: Secondary | ICD-10-CM | POA: Diagnosis not present

## 2023-05-16 DIAGNOSIS — Z794 Long term (current) use of insulin: Secondary | ICD-10-CM | POA: Diagnosis not present

## 2023-05-16 DIAGNOSIS — R7989 Other specified abnormal findings of blood chemistry: Secondary | ICD-10-CM | POA: Diagnosis not present

## 2023-05-16 DIAGNOSIS — E1140 Type 2 diabetes mellitus with diabetic neuropathy, unspecified: Secondary | ICD-10-CM | POA: Diagnosis present

## 2023-05-16 DIAGNOSIS — I427 Cardiomyopathy due to drug and external agent: Secondary | ICD-10-CM | POA: Diagnosis not present

## 2023-05-16 DIAGNOSIS — Z8616 Personal history of COVID-19: Secondary | ICD-10-CM | POA: Diagnosis not present

## 2023-05-16 DIAGNOSIS — Z6841 Body Mass Index (BMI) 40.0 and over, adult: Secondary | ICD-10-CM | POA: Diagnosis not present

## 2023-05-16 DIAGNOSIS — Z515 Encounter for palliative care: Secondary | ICD-10-CM | POA: Diagnosis not present

## 2023-05-16 DIAGNOSIS — R32 Unspecified urinary incontinence: Secondary | ICD-10-CM | POA: Diagnosis not present

## 2023-05-16 DIAGNOSIS — R52 Pain, unspecified: Secondary | ICD-10-CM | POA: Diagnosis not present

## 2023-05-16 DIAGNOSIS — E662 Morbid (severe) obesity with alveolar hypoventilation: Secondary | ICD-10-CM | POA: Diagnosis not present

## 2023-05-16 DIAGNOSIS — E1151 Type 2 diabetes mellitus with diabetic peripheral angiopathy without gangrene: Secondary | ICD-10-CM | POA: Diagnosis present

## 2023-05-16 DIAGNOSIS — G2581 Restless legs syndrome: Secondary | ICD-10-CM | POA: Diagnosis present

## 2023-05-16 DIAGNOSIS — Z4901 Encounter for fitting and adjustment of extracorporeal dialysis catheter: Secondary | ICD-10-CM | POA: Diagnosis not present

## 2023-05-16 DIAGNOSIS — N189 Chronic kidney disease, unspecified: Secondary | ICD-10-CM | POA: Diagnosis not present

## 2023-05-16 DIAGNOSIS — T405X1A Poisoning by cocaine, accidental (unintentional), initial encounter: Secondary | ICD-10-CM | POA: Diagnosis not present

## 2023-05-16 DIAGNOSIS — I509 Heart failure, unspecified: Secondary | ICD-10-CM | POA: Diagnosis not present

## 2023-05-16 LAB — GLUCOSE, CAPILLARY
Glucose-Capillary: 419 mg/dL — ABNORMAL HIGH (ref 70–99)
Glucose-Capillary: 357 mg/dL — ABNORMAL HIGH (ref 70–99)
Glucose-Capillary: 310 mg/dL — ABNORMAL HIGH (ref 70–99)
Glucose-Capillary: 424 mg/dL — ABNORMAL HIGH (ref 70–99)
Glucose-Capillary: 451 mg/dL — ABNORMAL HIGH (ref 70–99)
Glucose-Capillary: 508 mg/dL (ref 70–99)
Glucose-Capillary: 492 mg/dL — ABNORMAL HIGH (ref 70–99)

## 2023-05-16 LAB — CBC
HCT: 33.3 % — ABNORMAL LOW (ref 36.0–46.0)
Hemoglobin: 10.6 g/dL — ABNORMAL LOW (ref 12.0–15.0)
MCH: 28.9 pg (ref 26.0–34.0)
MCHC: 31.8 g/dL (ref 30.0–36.0)
MCV: 90.7 fL (ref 80.0–100.0)
Platelets: 333 10*3/uL (ref 150–400)
RBC: 3.67 MIL/uL — ABNORMAL LOW (ref 3.87–5.11)
RDW: 16.8 % — ABNORMAL HIGH (ref 11.5–15.5)
WBC: 8.8 10*3/uL (ref 4.0–10.5)
nRBC: 0 % (ref 0.0–0.2)

## 2023-05-16 LAB — BLOOD GAS, VENOUS
Acid-Base Excess: 8.7 mmol/L — ABNORMAL HIGH (ref 0.0–2.0)
Acid-Base Excess: 12.1 mmol/L — ABNORMAL HIGH (ref 0.0–2.0)
Bicarbonate: 37.2 mmol/L — ABNORMAL HIGH (ref 20.0–28.0)
Bicarbonate: 39.3 mmol/L — ABNORMAL HIGH (ref 20.0–28.0)
O2 Saturation: 94 %
O2 Saturation: 88.2 %
Patient temperature: 36.7
Patient temperature: 37
pCO2, Ven: 68 mmHg — ABNORMAL HIGH (ref 44–60)
pCO2, Ven: 65 mmHg — ABNORMAL HIGH (ref 44–60)
pH, Ven: 7.34 (ref 7.25–7.43)
pH, Ven: 7.39 (ref 7.25–7.43)
pO2, Ven: 72 mmHg — ABNORMAL HIGH (ref 32–45)
pO2, Ven: 55 mmHg — ABNORMAL HIGH (ref 32–45)

## 2023-05-16 LAB — BASIC METABOLIC PANEL
Anion gap: 17 — ABNORMAL HIGH (ref 5–15)
Anion gap: 16 — ABNORMAL HIGH (ref 5–15)
BUN: 157 mg/dL — ABNORMAL HIGH (ref 6–20)
BUN: 153 mg/dL — ABNORMAL HIGH (ref 6–20)
CO2: 31 mmol/L (ref 22–32)
CO2: 32 mmol/L (ref 22–32)
Calcium: 8.8 mg/dL — ABNORMAL LOW (ref 8.9–10.3)
Calcium: 8.8 mg/dL — ABNORMAL LOW (ref 8.9–10.3)
Chloride: 82 mmol/L — ABNORMAL LOW (ref 98–111)
Chloride: 82 mmol/L — ABNORMAL LOW (ref 98–111)
Creatinine, Ser: 2.79 mg/dL — ABNORMAL HIGH (ref 0.44–1.00)
Creatinine, Ser: 2.44 mg/dL — ABNORMAL HIGH (ref 0.44–1.00)
GFR, Estimated: 21 mL/min — ABNORMAL LOW (ref >60)
GFR, Estimated: 24 mL/min — ABNORMAL LOW (ref >60)
Glucose, Bld: 484 mg/dL — ABNORMAL HIGH (ref 70–99)
Glucose, Bld: 480 mg/dL — ABNORMAL HIGH (ref 70–99)
Potassium: 3.8 mmol/L (ref 3.5–5.1)
Potassium: 3.6 mmol/L (ref 3.5–5.1)
Sodium: 130 mmol/L — ABNORMAL LOW (ref 135–145)
Sodium: 130 mmol/L — ABNORMAL LOW (ref 135–145)

## 2023-05-16 LAB — TROPONIN I (HIGH SENSITIVITY)
Troponin I (High Sensitivity): 39 ng/L — ABNORMAL HIGH (ref <18)
Troponin I (High Sensitivity): 36 ng/L — ABNORMAL HIGH (ref <18)
Troponin I (High Sensitivity): 38 ng/L — ABNORMAL HIGH (ref <18)

## 2023-05-16 LAB — BETA-HYDROXYBUTYRIC ACID
Beta-Hydroxybutyric Acid: 0.12 mmol/L (ref 0.05–0.27)
Beta-Hydroxybutyric Acid: 0.14 mmol/L (ref 0.05–0.27)

## 2023-05-16 LAB — GLUCOSE, RANDOM: Glucose, Bld: 428 mg/dL — ABNORMAL HIGH (ref 70–99)

## 2023-05-16 LAB — MAGNESIUM: Magnesium: 2 mg/dL (ref 1.7–2.4)

## 2023-05-16 MED ORDER — ENOXAPARIN SODIUM 80 MG/0.8ML IJ SOSY
80.0000 mg | PREFILLED_SYRINGE | INTRAMUSCULAR | Status: DC
  Administered 2023-05-16 – 2023-05-24 (×8): 80 mg via SUBCUTANEOUS
  Filled 2023-05-16 (×9): qty 0.8

## 2023-05-16 MED ORDER — HYDROMORPHONE HCL 2 MG PO TABS
1.0000 mg | ORAL_TABLET | Freq: Once | ORAL | Status: AC
  Administered 2023-05-16: 1 mg via ORAL
  Filled 2023-05-16: qty 1

## 2023-05-16 MED ORDER — FUROSEMIDE 10 MG/ML IJ SOLN
80.0000 mg | Freq: Two times a day (BID) | INTRAMUSCULAR | Status: DC
  Administered 2023-05-16 – 2023-05-17 (×3): 80 mg via INTRAVENOUS
  Filled 2023-05-16 (×3): qty 8

## 2023-05-16 MED ORDER — INSULIN ASPART 100 UNIT/ML IJ SOLN
10.0000 [IU] | Freq: Three times a day (TID) | INTRAMUSCULAR | Status: DC
  Administered 2023-05-16 – 2023-05-17 (×2): 10 [IU] via SUBCUTANEOUS

## 2023-05-16 MED ORDER — INSULIN ASPART 100 UNIT/ML IJ SOLN
10.0000 [IU] | Freq: Once | INTRAMUSCULAR | Status: AC
  Administered 2023-05-16: 10 [IU] via SUBCUTANEOUS

## 2023-05-16 MED ORDER — INSULIN ASPART 100 UNIT/ML IJ SOLN
7.0000 [IU] | Freq: Once | INTRAMUSCULAR | Status: AC
  Administered 2023-05-16: 7 [IU] via SUBCUTANEOUS

## 2023-05-16 MED ORDER — METHOCARBAMOL 500 MG PO TABS
750.0000 mg | ORAL_TABLET | Freq: Once | ORAL | Status: AC
  Administered 2023-05-16: 750 mg via ORAL
  Filled 2023-05-16: qty 2

## 2023-05-16 MED ORDER — GABAPENTIN 300 MG PO CAPS
300.0000 mg | ORAL_CAPSULE | Freq: Once | ORAL | Status: AC
  Administered 2023-05-16: 300 mg via ORAL
  Filled 2023-05-16: qty 1

## 2023-05-16 MED ORDER — ACETAMINOPHEN 325 MG PO TABS
325.0000 mg | ORAL_TABLET | ORAL | Status: DC
  Administered 2023-05-16 – 2023-05-25 (×33): 325 mg via ORAL
  Filled 2023-05-16 (×37): qty 1

## 2023-05-16 MED ORDER — INSULIN ASPART 100 UNIT/ML IJ SOLN
15.0000 [IU] | Freq: Once | INTRAMUSCULAR | Status: AC
  Administered 2023-05-16: 15 [IU] via SUBCUTANEOUS

## 2023-05-16 MED ORDER — SODIUM CHLORIDE 0.9 % IV SOLN
12.5000 mg | Freq: Once | INTRAVENOUS | Status: AC
  Administered 2023-05-16: 12.5 mg via INTRAVENOUS
  Filled 2023-05-16: qty 12.5

## 2023-05-16 NOTE — Consult Note (Addendum)
[facility] Nephrology Consultation Note  Requesting MD: Vonna Drafts  Reason for consult: Poor renal function with ongoing diuresis  HPI:  [name] is a 47 y.o. female with PMHx HFpEF/RV failure, OSA, super morbid obesity, uncontrolled DM, CKD IIIb, recurrent cocaine use, and left leg BKA/right transmetatarsal initially p/w chest pain after RHC 6/6 showing low cardiac output CI 1.83, low PAPi of 1.4 with concern for primary RV failure, trops flat, EKG unchanged.   Consulted nephrology on request of Heart Failure Team due to worsening renal function in the setting of diuresis.   Follows with Dr. Arrie Aran at [facility]. Last seen by Dr. Allena Katz 04/29/23 who recommended torsemide 100 mg daily and metolazone 5 mg 3 days a week based on BUN/Cr at that time. She was instructed to follow up outpatient in approx 1 -2 weeks but did not show.   Patient reports that she is in pain all over most specifically in her legs. Has bipap on in the room. Tearful and reporting that she has trouble breathing on the bipap (high 90s in room). No confusion, change in mentation, nausea, vomiting, diarrhea.    Creat  Date/Time Value Ref Range Status  08/05/2015 11:14 AM 0.51 0.50 - 1.10 mg/dL Final  16/09/9603 54:09 AM 0.44 (L) 0.50 - 1.10 mg/dL Final   Creatinine, Ser  Date/Time Value Ref Range Status  05/15/2023 11:51 PM 2.79 (H) 0.44 - 1.00 mg/dL Final  81/19/1478 29:56 AM 2.55 (H) 0.44 - 1.00 mg/dL Final  21/30/8657 84:69 PM 2.19 (H) 0.44 - 1.00 mg/dL Final  62/95/2841 32:44 PM 2.24 (H) 0.44 - 1.00 mg/dL Final  12/08/7251 66:44 AM 2.05 (H) 0.44 - 1.00 mg/dL Final  03/47/4259 56:38 AM 2.29 (H) 0.44 - 1.00 mg/dL Final  75/64/3329 51:88 AM 2.43 (H) 0.44 - 1.00 mg/dL Final  41/66/0630 16:01 PM 2.27 (H) 0.44 - 1.00 mg/dL Final  09/32/3557 32:20 PM 1.96 (H) 0.57 - 1.00 mg/dL Final  25/42/7062 37:62 AM 1.95 (H) 0.44 - 1.00 mg/dL Final  83/15/1761 60:73 PM 1.85 (H) 0.44 - 1.00 mg/dL Final  71/05/2693  85:46 AM 1.82 (H) 0.44 - 1.00 mg/dL Final  27/02/5008 38:18 PM 1.67 (H) 0.44 - 1.00 mg/dL Final  29/93/7169 67:89 PM 1.74 (H) 0.57 - 1.00 mg/dL Final  38/09/1750 02:58 AM 1.56 (H) 0.44 - 1.00 mg/dL Final  52/77/8242 35:36 AM 1.46 (H) 0.44 - 1.00 mg/dL Final  14/43/1540 08:67 AM 1.53 (H) 0.44 - 1.00 mg/dL Final  61/95/0932 67:12 AM 1.74 (H) 0.44 - 1.00 mg/dL Final  45/80/9983 38:25 AM 1.80 (H) 0.44 - 1.00 mg/dL Final  05/39/7673 41:93 PM 1.33 (H) 0.57 - 1.00 mg/dL Final  79/01/4096 35:32 AM 0.95 0.44 - 1.00 mg/dL Final  99/24/2683 41:96 AM 1.00 0.44 - 1.00 mg/dL Final  22/29/7989 21:19 AM 0.93 0.44 - 1.00 mg/dL Final  41/74/0814 48:18 AM 1.21 (H) 0.44 - 1.00 mg/dL Final  56/31/4970 26:37 AM 1.75 (H) 0.44 - 1.00 mg/dL Final  85/88/5027 74:12 PM 2.08 (H) 0.44 - 1.00 mg/dL Final  87/86/7672 09:47 PM 2.08 (H) 0.44 - 1.00 mg/dL Final    Comment:    DELTA CHECK NOTED  02/21/2023 12:29 AM 2.01 (H) 0.44 - 1.00 mg/dL Final  09/62/8366 29:47 AM 1.15 (H) 0.44 - 1.00 mg/dL Final  65/46/5035 46:56 AM 1.30 (H) 0.44 - 1.00 mg/dL Final  81/27/5170 01:74 AM 1.18 (H) 0.44 - 1.00 mg/dL Final  94/49/6759 16:38 AM 1.45 (H) 0.44 - 1.00 mg/dL Final  46/65/9935 70:17 AM 1.54 (H)  0.44 - 1.00 mg/dL Final  60/45/4098 11:91 PM 1.56 (H) 0.57 - 1.00 mg/dL Final  47/82/9562 13:08 AM 1.57 (H) 0.44 - 1.00 mg/dL Final  65/78/4696 29:52 AM 1.57 (H) 0.44 - 1.00 mg/dL Final  84/13/2440 10:27 AM 1.19 (H) 0.57 - 1.00 mg/dL Final  25/36/6440 34:74 AM 1.51 (H) 0.44 - 1.00 mg/dL Final  25/95/6387 56:43 AM 1.42 (H) 0.57 - 1.00 mg/dL Final  32/95/1884 16:60 AM 1.72 (H) 0.44 - 1.00 mg/dL Final  63/12/6008 93:23 AM 1.80 (H) 0.44 - 1.00 mg/dL Final  55/73/2202 54:27 AM 2.67 (H) 0.44 - 1.00 mg/dL Final  05/29/7627 31:51 PM 3.08 (H) 0.44 - 1.00 mg/dL Final  76/16/0737 10:62 AM 3.69 (H) 0.44 - 1.00 mg/dL Final  69/48/5462 70:35 AM 4.11 (H) 0.44 - 1.00 mg/dL Final  00/93/8182 99:37 PM 4.95 (H) 0.44 - 1.00 mg/dL Final  16/96/7893  81:01 AM 5.80 (H) 0.44 - 1.00 mg/dL Final  75/09/2584 27:78 AM 6.09 (H) 0.44 - 1.00 mg/dL Final  24/23/5361 44:31 PM 6.15 (H) 0.44 - 1.00 mg/dL Final  54/00/8676 19:50 AM 5.99 (H) 0.44 - 1.00 mg/dL Final    Comment:    DELTA CHECK NOTED  07/07/2022 01:29 AM 3.93 (H) 0.44 - 1.00 mg/dL Final    Comment:    DELTA CHECK NOTED  07/06/2022 06:54 AM 2.63 (H) 0.44 - 1.00 mg/dL Final    Comment:    RESULTS VERIFIED BY REPEAT TESTING     PMHx:   Past Medical History:  Diagnosis Date   Abscess of groin, left    Acute on chronic diastolic (congestive) heart failure ([name]) 02/05/2022   Acute on chronic respiratory failure with hypoxia ([name]) 04/11/2022   Acute osteomyelitis of ankle or foot, left ([name]) 01/31/2018   Acute respiratory failure with hypoxia and hypercarbia ([name]) 06/15/2020   Acute tubular necrosis ([name]) 07/12/2022   Alveolar hypoventilation    Amputation stump infection ([name]) 04/09/2018   Anemia    not on iron pill   Asthma    Bipolar 2 disorder ([name])    Candidiasis of vagina 05/15/2022   Carpal tunnel syndrome on right    recurrent   Cellulitis 08/2010-08/2011   Chest pain with low risk for cardiac etiology 05/11/2021   Chronic pain    COPD (chronic obstructive pulmonary disease) ([name])    Symbicort daily and Proventil as needed   Costochondritis    De Quervain's tenosynovitis, bilateral 11/01/2015   Depression    Diabetes mellitus type II, uncontrolled 2000   Type 2, Uncontrolled.Takes Lantus daily.Fasting blood sugar runs 150   Diabetic ulcer of right foot ([name]) 12/19/2021   Drug-seeking behavior    Elevated troponin    Exposure to mold 04/21/2020   Family history of metabolic acidosis with increased anion gap 04/12/2023   GERD (gastroesophageal reflux disease)    takes Pantoprazole and Zantac daily   HLD (hyperlipidemia)    takes Atorvastatin daily   Hypertension    takes Lisinopril and Coreg daily   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[name]) 04/11/2019   Left shoulder pain  06/23/2019   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  Mild diffuse proximal LAD stenosis with no evidence of obstructive disease (20 to 30% diffuse stenosis);  2.  Left dominant circumflex with widely patent obtuse marginal branches and left PDA branch, no significant  stenoses present  3.  Nondominant RCA with mild diffuse nonobstructive stenosis    OSA on CPAP    Peripheral neuropathy    takes Gabapentin daily   Pneumonia    "walking" several yrs ago and as a baby (12/05/2018)   Pneumonia due to COVID-19 virus 04/14/2021   Primary osteoarthritis of first carpometacarpal joint of left hand 07/30/2016   Rectal fissure    Restless leg    Right carpal tunnel syndrome 09/01/2011   SVT (supraventricular tachycardia)    Syncope 02/25/2016   Thrombocytosis 04/11/2022   Urinary frequency    Urinary incontinence 10/23/2020   UTI (urinary tract infection) 04/15/2021   Varicose veins    Right medial thigh and Left leg     Past Surgical History:  Procedure Laterality Date   AMPUTATION Left 02/01/2018   Procedure: LEFT FOURTH AND 5TH TOE RAY AMPUTATION;  Surgeon: Nadara Mustard, MD;  Location: [facility] OR;  Service: Orthopedics;  Laterality: Left;   AMPUTATION Left 03/03/2018   Procedure: LEFT BELOW KNEE AMPUTATION;  Surgeon: Nadara Mustard, MD;  Location: [facility] OR;  Service: Orthopedics;  Laterality: Left;   AMPUTATION Right 05/21/2022   Procedure: REVISION AMPUTATION RAY 1ST;  Surgeon: Candelaria Stagers, DPM;  Location: [facility] OR;  Service: Podiatry;  Laterality: Right;   CARPAL TUNNEL RELEASE Bilateral    CESAREAN SECTION  2007   CORONARY ANGIOGRAPHY N/A 11/26/2020   Procedure: CORONARY ANGIOGRAPHY;  Surgeon: Tonny Bollman, MD;  Location: [facility] INVASIVE CV LAB;  Service: Cardiovascular;  Laterality: N/A;   FOOT AMPUTATION  Right    INCISION AND DRAINAGE ABSCESS Left 10/16/2021   Procedure: INCISION AND DRAINAGE ABSCESS;  Surgeon: Fritzi Mandes, MD;  Location: [facility] OR;  Service: General;  Laterality: Left;   INCISION AND DRAINAGE PERIRECTAL ABSCESS Left 05/18/2019   Procedure: IRRIGATION AND DEBRIDEMENT OF PANNIS ABSCESS, POSSIBLE DEBRIDEMENT OF BUTTOCK WOUND;  Surgeon: Manus Rudd, MD;  Location: [facility] OR;  Service: General;  Laterality: Left;   IRRIGATION AND DEBRIDEMENT BUTTOCKS Left 05/17/2019   Procedure: IRRIGATION AND DEBRIDEMENT BUTTOCKS;  Surgeon: Manus Rudd, MD;  Location: [facility] OR;  Service: General;  Laterality: Left;   KNEE ARTHROSCOPY Right 07/17/2010   LEFT HEART CATHETERIZATION WITH CORONARY ANGIOGRAM N/A 07/27/2012   Procedure: LEFT HEART CATHETERIZATION WITH CORONARY ANGIOGRAM;  Surgeon: Tonny Bollman, MD;  Location: [facility] CATH LAB;  Service: Cardiovascular;  Laterality: N/A;   MASS EXCISION N/A 06/29/2013   Procedure:  WIDE LOCAL EXCISION OF POSTERIOR NECK ABSCESS;  Surgeon: Axel Filler, MD;  Location: [facility] OR;  Service: General;  Laterality: N/A;   REPAIR KNEE LIGAMENT Left    "fixed ligaments and chipped patella"   RIGHT HEART CATH N/A 05/12/2023   Procedure: RIGHT HEART CATH;  Surgeon: Laurey Morale, MD;  Location: [facility] INVASIVE CV LAB;  Service: Cardiovascular;  Laterality: N/A;   right transmetatarsal amputation      TRACHEOSTOMY TUBE PLACEMENT N/A 07/10/2022   Procedure: CONTROLLED EXTUBATION;  Surgeon: Serena Colonel, MD;  Location: [facility] OR;  Service: ENT;  Laterality: N/A;    Family Hx:  Family History  Problem Relation Age of Onset   Diabetes Mother    Hyperlipidemia Mother    Depression Mother    GER disease Mother    Allergic rhinitis Mother    Restless legs syndrome Mother    Heart attack Paternal Uncle    Heart disease Paternal Grandmother    Heart attack Paternal Grandmother    Heart attack Paternal Grandfather  Heart disease Paternal Grandfather    Heart attack Father     Migraines Sister    Cancer Maternal Grandmother        COLON   Hypertension Maternal Grandmother    Hyperlipidemia Maternal Grandmother    Diabetes Maternal Grandmother    Other Maternal Grandfather        GUN SHOT   Anxiety disorder Sister    Asthma Child     Social History:  reports that she quit smoking about 17 years ago. Her smoking use included cigarettes. She has a 3.75 pack-year smoking history. She has never used smokeless tobacco. She reports that she does not currently use alcohol. She reports current drug use. Drug: "Crack" cocaine.  Allergies:  Allergies  Allergen Reactions   Cefepime Other (See Comments)    AKI, see records from [facility] hospitalization in January 2020.   Other reaction(s): Other (See Comments) "Shut down organs/kidneys"  Note pt has tolerated Rocephin and Keflex Other reaction(s): Not available   Iodine Other (See Comments)    Kidney dysfunction   Kiwi Extract Anaphylaxis, Shortness Of Breath and Swelling   Morphine And Codeine Nausea And Vomiting    Can take with antinausea    Pentoxifylline Nausea And Vomiting    Trental   Cephalosporins Other (See Comments)    Pt states that they cause her kidneys to shut down   Toradol [Ketorolac Tromethamine] Other (See Comments)    Feels like something is crawling on me   Nubain [Nalbuphine Hcl] Other (See Comments)    "FEELS LIKE SOMETHING CRAWLING ON ME" Hallucinations     Medications: Prior to Admission medications   Medication Sig Start Date End Date Taking? Authorizing Provider  acetaminophen (TYLENOL) 325 MG tablet Take 1-2 tablets (325-650 mg total) by mouth every 4 (four) hours as needed for mild pain. 06/04/22   Setzer, Lynnell Jude, PA-C  albuterol (VENTOLIN HFA) 108 (90 Base) MCG/ACT inhaler Inhale 2 puffs into the lungs every 6 (six) hours as needed for wheezing or shortness of breath. 03/28/23   Latrelle Dodrill, MD  amLODipine (NORVASC) 10 MG tablet Take 1 tablet (10 mg total) by mouth daily.  09/27/22   Latrelle Dodrill, MD  aspirin EC 81 MG tablet Take 1 tablet (81 mg total) by mouth daily. Swallow whole. 11/19/20   Tonny Bollman, MD  Brinzolamide-Brimonidine [facility]) 1-0.2 % SUSP Place 1 drop into both eyes in the morning, at noon, and at bedtime.    [provider]  budesonide-formoterol (SYMBICORT) 160-4.5 MCG/ACT inhaler Inhale 2 puffs into the lungs 2 (two) times daily. 03/01/23   Latrelle Dodrill, MD  cetirizine (ZYRTEC) 10 MG tablet Take 1 tablet (10 mg total) by mouth daily as needed for allergies. 02/11/22   Latrelle Dodrill, MD  Continuous Blood Gluc Sensor (DEXCOM G6 SENSOR) MISC Inject 1 applicator into the skin as directed. Change sensor every 10 days. 11/05/21   Latrelle Dodrill, MD  Continuous Blood Gluc Transmit (DEXCOM G6 TRANSMITTER) MISC Inject 1 Device into the skin as directed. Reuse 8 times with sensor changes. 09/03/21   Latrelle Dodrill, MD  diclofenac Sodium (VOLTAREN) 1 % GEL Apply 2 g topically 4 (four) times daily. Patient taking differently: Apply 2 g topically 4 (four) times daily as needed (joint pain). 02/26/23   Fayette Pho, MD  DULoxetine (CYMBALTA) 60 MG capsule TAKE 2 CAPSULES BY MOUTH DAILY 03/28/23   Latrelle Dodrill, MD  gabapentin (NEURONTIN) 300 MG capsule Take 2 capsules (  600 mg total) by mouth at bedtime. 04/29/23 05/29/23  Leatha Gilding, MD  GNP ULTICARE PEN NEEDLES 32G X 4 MM MISC USE TO inject insulin 2 TIMES DAILY 10/02/21   Latrelle Dodrill, MD  insulin aspart (NOVOLOG) 100 UNIT/ML injection Inject 45 Units into the skin 3 (three) times daily before meals.    [provider]  insulin degludec (TRESIBA FLEXTOUCH) 200 UNIT/ML FlexTouch Pen Inject 70 Units into the skin 2 (two) times daily. 04/14/23   Alfredo Martinez, MD  ipratropium-albuterol (DUONEB) 0.5-2.5 (3) MG/3ML SOLN Take 3 mLs by nebulization every 6 (six) hours as needed. 09/24/22   Charlott Holler, MD  latanoprost (XALATAN) 0.005 %  ophthalmic solution Place 1 drop into both eyes daily. 02/04/23   [provider]  metolazone (ZAROXOLYN) 5 MG tablet Take 1 tablet (5 mg total) by mouth every Monday, Wednesday, and Friday. 04/29/23   Leatha Gilding, MD  Multiple Vitamins-Minerals (ONE-A-DAY WOMENS) tablet Take 1 tablet by mouth daily.    [provider]  nitroGLYCERIN (NITROSTAT) 0.4 MG SL tablet Place 1 tablet (0.4 mg total) under the tongue every 5 (five) minutes as needed for chest pain. 11/04/22   Latrelle Dodrill, MD  nystatin (MYCOSTATIN/NYSTOP) powder Apply 1 Application topically 3 (three) times daily. 03/07/23   Latrelle Dodrill, MD  omeprazole (PRILOSEC) 40 MG capsule Take 1 capsule (40 mg total) by mouth in the morning and at bedtime. 04/07/23   Charlott Holler, MD  ondansetron (ZOFRAN-ODT) 4 MG disintegrating tablet Take 1 tablet (4 mg total) by mouth every 8 (eight) hours as needed for nausea or vomiting. Patient not taking: Reported on 05/09/2023 04/21/23   Alicia Amel, MD  potassium chloride SA (KLOR-CON M) 20 MEQ tablet Take 1 tablet (20 mEq total) by mouth daily. 05/12/23   Laurey Morale, MD  prednisoLONE acetate (PRED FORTE) 1 % ophthalmic suspension Place 1 drop into both eyes 4 (four) times daily. 04/20/23   [provider]  promethazine (PHENERGAN) 12.5 MG tablet Take 1 tablet (12.5 mg total) by mouth every 6 (six) hours as needed for nausea or vomiting. 04/29/23   Gherghe, Daylene Katayama, MD  RHOPRESSA 0.02 % SOLN Place 1 drop into the left eye daily. 01/21/23   [provider]  rOPINIRole (REQUIP) 1 MG tablet Take 1 tablet (1 mg total) by mouth at bedtime. 12/16/22   Latrelle Dodrill, MD  rosuvastatin (CRESTOR) 10 MG tablet Take 1 tablet (10 mg total) by mouth daily. 04/14/23   Alfredo Martinez, MD  torsemide (DEMADEX) 100 MG tablet Take 1 tablet (100 mg total) by mouth every morning AND 0.5 tablets (50 mg total) every evening. 05/12/23   Laurey Morale, MD  traZODone (DESYREL)  100 MG tablet TAKE 1 TABLET BY MOUTH AT BEDTIME AS NEEDED FOR SLEEP 03/03/23   Latrelle Dodrill, MD  Vitamin D, Cholecalciferol, 25 MCG (1000 UT) CAPS Take 1,000 Units by mouth daily. 10/05/22   Latrelle Dodrill, MD    I have reviewed the patient's current medications.  Labs:  Results for orders placed or performed during the hospital encounter of 05/14/23 (from the past 48 hour(s))  CBC     Status: Abnormal   Collection Time: 05/14/23 10:40 PM  Result Value Ref Range   WBC 8.6 4.0 - 10.5 K/uL   RBC 3.81 (L) 3.87 - 5.11 MIL/uL   Hemoglobin 11.2 (L) 12.0 - 15.0 g/dL   HCT 16.1 (L) 09.6 - 04.5 %  MCV 91.3 80.0 - 100.0 fL   MCH 29.4 26.0 - 34.0 pg   MCHC 32.2 30.0 - 36.0 g/dL   RDW 78.2 (H) 95.6 - 21.3 %   Platelets 329 150 - 400 K/uL   nRBC 0.2 0.0 - 0.2 %    Comment: Performed at [facility] Lab, [address] [place], [place] [address]  Troponin I (High Sensitivity)     Status: Abnormal   Collection Time: 05/14/23 10:40 PM  Result Value Ref Range   Troponin I (High Sensitivity) 43 (H) <18 ng/L    Comment: (NOTE) Elevated high sensitivity troponin I (hsTnI) values and significant  changes across serial measurements may suggest ACS but many other  chronic and acute conditions are known to elevate hsTnI results.  Refer to the "Links" section for chest pain algorithms and additional  guidance. Performed at [facility], [address], [place], [place] [address]   Brain natriuretic peptide     Status: None   Collection Time: 05/14/23 10:40 PM  Result Value Ref Range   B Natriuretic Peptide 19.3 0.0 - 100.0 pg/mL    Comment: Performed at [facility], [address] [place], [place] [address]  Basic metabolic panel     Status: Abnormal   Collection Time: 05/14/23 10:40 PM  Result Value Ref Range   Sodium 137 135 - 145 mmol/L   Potassium 3.2 (L) 3.5 - 5.1 mmol/L   Chloride 85 (L) 98 - 111 mmol/L   CO2 34 (H) 22 - 32 mmol/L   Glucose, Bld 243 (H) 70 -  99 mg/dL    Comment: Glucose reference range applies only to samples taken after fasting for at least 8 hours.   BUN 143 (H) 6 - 20 mg/dL   Creatinine, Ser 4.13 (H) 0.44 - 1.00 mg/dL   Calcium 9.2 8.9 - 24.4 mg/dL   GFR, Estimated 28 (L) >60 mL/min    Comment: (NOTE) Calculated using the CKD-EPI Creatinine Equation (2021)    Anion gap 18 (H) 5 - 15    Comment: Performed at [facility], [address], [place], [place] [address]  I-Stat beta hCG blood, ED     Status: None   Collection Time: 05/14/23 11:01 PM  Result Value Ref Range   I-stat hCG, quantitative <5.0 <5 mIU/mL   Comment 3            Comment:   GEST. AGE      CONC.  (mIU/mL)   <=1 WEEK        5 - 50     2 WEEKS       50 - 500     3 WEEKS       100 - 10,000     4 WEEKS     1,000 - 30,000        FEMALE AND NON-PREGNANT FEMALE:     LESS THAN 5 mIU/mL   Blood gas, venous     Status: Abnormal   Collection Time: 05/14/23 11:55 PM  Result Value Ref Range   pH, Ven 7.36 7.25 - 7.43   pCO2, Ven 67 (H) 44 - 60 mmHg   pO2, Ven 49 (H) 32 - 45 mmHg   Bicarbonate 37.9 (H) 20.0 - 28.0 mmol/L   Acid-Base Excess 9.7 (H) 0.0 - 2.0 mmol/L   O2 Saturation 82.9 %   Patient temperature 37.0    Collection site VEIN     Comment: Performed  at [facility] Lab, [address], [place], [place] [address]  Troponin I (High Sensitivity)     Status: Abnormal   Collection Time: 05/15/23  1:00 AM  Result Value Ref Range   Troponin I (High Sensitivity) 44 (H) <18 ng/L    Comment: (NOTE) Elevated high sensitivity troponin I (hsTnI) values and significant  changes across serial measurements may suggest ACS but many other  chronic and acute conditions are known to elevate hsTnI results.  Refer to the "Links" section for chest pain algorithms and additional  guidance. Performed at [facility], [address] [place], [place] [address]   Urine rapid drug screen (hosp performed)     Status: Abnormal   Collection Time: 05/15/23   1:35 AM  Result Value Ref Range   Opiates NONE DETECTED NONE DETECTED   Cocaine POSITIVE (A) NONE DETECTED   Benzodiazepines NONE DETECTED NONE DETECTED   Amphetamines NONE DETECTED NONE DETECTED   Tetrahydrocannabinol NONE DETECTED NONE DETECTED   Barbiturates NONE DETECTED NONE DETECTED    Comment: (NOTE) DRUG SCREEN FOR MEDICAL PURPOSES ONLY.  IF CONFIRMATION IS NEEDED FOR ANY PURPOSE, NOTIFY LAB WITHIN 5 DAYS.  LOWEST DETECTABLE LIMITS FOR URINE DRUG SCREEN Drug Class                     Cutoff (ng/mL) Amphetamine and metabolites    1000 Barbiturate and metabolites    200 Benzodiazepine                 200 Opiates and metabolites        300 Cocaine and metabolites        300 THC                            50 Performed at [facility] Lab, [address], [place], [place] [address]   Basic metabolic panel     Status: Abnormal   Collection Time: 05/15/23  5:16 AM  Result Value Ref Range   Sodium 135 135 - 145 mmol/L   Potassium 3.7 3.5 - 5.1 mmol/L   Chloride 88 (L) 98 - 111 mmol/L   CO2 32 22 - 32 mmol/L   Glucose, Bld 244 (H) 70 - 99 mg/dL    Comment: Glucose reference range applies only to samples taken after fasting for at least 8 hours.   BUN 146 (H) 6 - 20 mg/dL   Creatinine, Ser 9.14 (H) 0.44 - 1.00 mg/dL   Calcium 8.8 (L) 8.9 - 10.3 mg/dL   GFR, Estimated 23 (L) >60 mL/min    Comment: (NOTE) Calculated using the CKD-EPI Creatinine Equation (2021)    Anion gap 15 5 - 15    Comment: Performed at [facility], [address], [place], [place] [address]  CBG monitoring, ED     Status: Abnormal   Collection Time: 05/15/23  9:48 AM  Result Value Ref Range   Glucose-Capillary 273 (H) 70 - 99 mg/dL    Comment: Glucose reference range applies only to samples taken after fasting for at least 8 hours.  CBG monitoring, ED     Status: Abnormal   Collection Time: 05/15/23 12:12 PM  Result Value Ref Range   Glucose-Capillary 272 (H) 70 - 99 mg/dL    Comment:  Glucose reference range applies only to samples taken after fasting for at least 8 hours.  Glucose, capillary  Status: Abnormal   Collection Time: 05/15/23  4:47 PM  Result Value Ref Range   Glucose-Capillary 348 (H) 70 - 99 mg/dL    Comment: Glucose reference range applies only to samples taken after fasting for at least 8 hours.  Troponin I (High Sensitivity)     Status: Abnormal   Collection Time: 05/15/23  7:42 PM  Result Value Ref Range   Troponin I (High Sensitivity) 37 (H) <18 ng/L    Comment: (NOTE) Elevated high sensitivity troponin I (hsTnI) values and significant  changes across serial measurements may suggest ACS but many other  chronic and acute conditions are known to elevate hsTnI results.  Refer to the "Links" section for chest pain algorithms and additional  guidance. Performed at [facility], [address] [place], [place] [address]   CBC     Status: Abnormal   Collection Time: 05/15/23 11:51 PM  Result Value Ref Range   WBC 8.8 4.0 - 10.5 K/uL   RBC 3.67 (L) 3.87 - 5.11 MIL/uL   Hemoglobin 10.6 (L) 12.0 - 15.0 g/dL   HCT 60.4 (L) 54.0 - 98.1 %   MCV 90.7 80.0 - 100.0 fL   MCH 28.9 26.0 - 34.0 pg   MCHC 31.8 30.0 - 36.0 g/dL   RDW 19.1 (H) 47.8 - 29.5 %   Platelets 333 150 - 400 K/uL   nRBC 0.0 0.0 - 0.2 %    Comment: Performed at [facility], [address], [place], [place] [address]  Basic metabolic panel     Status: Abnormal   Collection Time: 05/15/23 11:51 PM  Result Value Ref Range   Sodium 130 (L) 135 - 145 mmol/L   Potassium 3.8 3.5 - 5.1 mmol/L   Chloride 82 (L) 98 - 111 mmol/L   CO2 31 22 - 32 mmol/L   Glucose, Bld 484 (H) 70 - 99 mg/dL    Comment: Glucose reference range applies only to samples taken after fasting for at least 8 hours.   BUN 157 (H) 6 - 20 mg/dL   Creatinine, Ser 8.65 (H) 0.44 - 1.00 mg/dL   Calcium 8.8 (L) 8.9 - 10.3 mg/dL   GFR, Estimated 21 (L) >60 mL/min    Comment: (NOTE) Calculated using the CKD-EPI  Creatinine Equation (2021)    Anion gap 17 (H) 5 - 15    Comment: Performed at [facility], [address], [place], [place] [address]  Magnesium     Status: None   Collection Time: 05/15/23 11:51 PM  Result Value Ref Range   Magnesium 2.0 1.7 - 2.4 mg/dL    Comment: Performed at [facility], [address], [place], [place] [address]  Troponin I (High Sensitivity)     Status: Abnormal   Collection Time: 05/15/23 11:51 PM  Result Value Ref Range   Troponin I (High Sensitivity) 39 (H) <18 ng/L    Comment: (NOTE) Elevated high sensitivity troponin I (hsTnI) values and significant  changes across serial measurements may suggest ACS but many other  chronic and acute conditions are known to elevate hsTnI results.  Refer to the "Links" section for chest pain algorithms and additional  guidance. Performed at [facility], [address] [place], [place] [address]   Beta-hydroxybutyric acid     Status: None   Collection Time: 05/16/23  1:43 AM  Result Value Ref Range   Beta-Hydroxybutyric Acid 0.12 0.05 - 0.27 mmol/L    Comment:  Performed at [facility], [address], [place], [place] [address]  Blood gas, venous     Status: Abnormal   Collection Time: 05/16/23  3:14 AM  Result Value Ref Range   pH, Ven 7.34 7.25 - 7.43   pCO2, Ven 68 (H) 44 - 60 mmHg   pO2, Ven 72 (H) 32 - 45 mmHg   Bicarbonate 37.2 (H) 20.0 - 28.0 mmol/L   Acid-Base Excess 8.7 (H) 0.0 - 2.0 mmol/L   O2 Saturation 94 %   Patient temperature 36.7     Comment: Performed at [facility], [address] [place], [place] [address]  Glucose, capillary     Status: Abnormal   Collection Time: 05/16/23  3:23 AM  Result Value Ref Range   Glucose-Capillary 419 (H) 70 - 99 mg/dL    Comment: Glucose reference range applies only to samples taken after fasting for at least 8 hours.  Glucose, capillary     Status: Abnormal   Collection Time: 05/16/23  4:28 AM  Result Value Ref Range    Glucose-Capillary 357 (H) 70 - 99 mg/dL    Comment: Glucose reference range applies only to samples taken after fasting for at least 8 hours.  Glucose, capillary     Status: Abnormal   Collection Time: 05/16/23  8:26 AM  Result Value Ref Range   Glucose-Capillary 310 (H) 70 - 99 mg/dL    Comment: Glucose reference range applies only to samples taken after fasting for at least 8 hours.  Glucose, capillary     Status: Abnormal   Collection Time: 05/16/23 12:31 PM  Result Value Ref Range   Glucose-Capillary 424 (H) 70 - 99 mg/dL    Comment: Glucose reference range applies only to samples taken after fasting for at least 8 hours.   *Note: Due to a large number of results and/or encounters for the requested time period, some results have not been displayed. A complete set of results can be found in Results Review.     ROS:  Pertinent items are noted in HPI.  Physical Exam: Vitals:   05/16/23 0405 05/16/23 1127  BP: (!) 96/50 (!) 158/77  Pulse: 95   Resp: 16 16  Temp: 98.6 F (37 C) 97.9 F (36.6 C)  SpO2: 95%      General exam: Appears nontoxic, layong on left sided, morbidly obese  Respiratory system: Clear to auscultation. Respiratory effort normal. Slight scattered wheeze  Cardiovascular system: S1 & S2 heard, RRR.  1+ pedal edema  Gastrointestinal system: Abdomen soft, obese TTP  Central nervous system: Alert and oriented. No focal neurological deficits. Extremities: Able to move extremities  Skin: No rashes, lesions or ulcers Psychiatry: Judgement and insight appear normal. Tearful mood   Assessment/Plan: Renal- AKI on CKD IV, Scr 2.55>2.79, BUN 146>157. Bicarb 31, Na 130, K nml. Output 2.2L 6/9, nonoliguric. No current evidence of uremia although BUN continually increasing. Likely acute injury in setting of transient hypotensive episodes based on chart review with continued diuresis, also current drug use. Currently on Lasix 80 mg BID, Metolazone 3xweekly M/W/F. Continue  with daily RFP, CBC, monitoring output with strict I/Os. Renal U/S 03/19/23: Trace right hydronephrosis. UA-3 weeks ago bland. Continue with current diuretic regimen while monitoring dialysis need daily if indicated. Patient seems to be experiencing possible symptoms of uremia given complaints but it is a difficult, mixed picture given her history. Could consider serologies (ANA, ANCA profile, C3/C4/CH 50, SPEP, UPEP, hepatitis B surface, hepatitis  C antibody, Serum free kappa lambda chain assay) but seems CKD is related to uncontrolled HTN and DM2 in setting of morbid obese and frequent drug use. Maintain MAP>65 for optimal renal perfusion. Avoid nephrotoxic medications including NSAIDs and iodinated intravenous contrast exposure unless the latter is absolutely indicated.  Preferred narcotic agents for pain control are hydromorphone, fentanyl, and methadone. Morphine should not be used. Avoid Baclofen and avoid oral sodium phosphate and magnesium citrate based laxatives / bowel preps. Continue strict Input and Output monitoring.  2. Hypertension/volume  - Significant hypervolemia, currently diuresing, no IVF. Labile BPs, limited due to body habitus but most systolics limited to <150s.  3.  Anemia  - Normocytic 11.2>10.6. Recommend iron studies.  4.  HFpEf  PVH/PAH - per cardiology, diuresing as above 5. OSA  OHS - On Bipap at night, needs 3-4 L during the day.   6.  DM2 - per primary    Recommendations -Iron studies-ordered  -RFP, mag, CBC in AM  -Continue current diuretic regimen per cardiology  -Strict I/Os, daily weights  -Hold on dialysis for now    [employer] 05/16/2023, 2:07 PM  [employer].

## 2023-05-16 NOTE — Assessment & Plan Note (Signed)
Last echo 3/24 with EF 55-60%.  Follows with cardiology.  Had RHC on 6/6 showing pulmonary hypertension, RV failure.  Home meds include torsemide 100 mg in the morning and 50 mg in the evening, metolazone 5 mg 3 days/week.  Difficult to assess volume status due to body habitus but suspect abdominal fluid retain given  tightness on exam.  CXR shows mild vascular congestion.  -Heart Failure team consulted, appreciate recs -IV Lasix 80mg  BID -home metolazone -Strict I/os -Daily weights

## 2023-05-16 NOTE — TOC Initial Note (Signed)
Transition of Care [facility]) - Initial/Assessment Note    Patient Details  Name: [name] MRN: [id number] Date of Birth: 1976/11/10  Transition [facility] CM/SW Contact:    Ronny Bacon, RN Phone Number: 05/16/2023, 3:32 PM  Clinical Narrative:   Spoke with patient at bedside. Patient reports lives in [place] with daughter, has RW, WC,BSC, and shower chair. Patient has O2 and BiPAP through [employer] and has HH Aide through [facility]. PCP is Family Medicine and use [employer]. TOC will continue to follow.                Expected Discharge Plan: Home w Home Health Services Barriers to Discharge: Continued Medical Work up   Patient Goals and CMS Choice Patient states their goals for this hospitalization and ongoing recovery are:: To return to hotel          Expected Discharge Plan and Services   Discharge Planning Services: CM Consult   Living arrangements for the past 2 months: Hotel/Motel Expected Discharge Date: 05/15/23                                    Prior Living Arrangements/Services Living arrangements for the past 2 months: Hotel/Motel Lives with:: Adult Children Patient language and need for interpreter reviewed:: Yes Do you feel safe going back to the place where you live?: Yes      Need for Family Participation in Patient Care: No (Comment) Care giver support system in place?: Yes (comment) Current home services: DME, Homehealth aide (RW,WC,BSC, Shower chair, BiPAP, O2, HH Aide) Criminal Activity/Legal Involvement Pertinent to Current Situation/Hospitalization: No - Comment as needed  Activities of Daily Living Home Assistive Devices/Equipment: Built-in shower seat, Environmental consultant (specify type), Wheelchair, Shower chair with back, Grab bars around toilet ADL Screening (condition at time of admission) Patient's cognitive ability adequate to safely complete daily activities?: Yes Is the patient deaf or have difficulty hearing?: No Does the patient  have difficulty seeing, even when wearing glasses/contacts?: No Does the patient have difficulty concentrating, remembering, or making decisions?: No Patient able to express need for assistance with ADLs?: No Does the patient have difficulty dressing or bathing?: No Independently performs ADLs?: No Communication: Independent Dressing (OT): Dependent Grooming: Dependent Feeding: Independent Bathing: Dependent Toileting: Dependent In/Out Bed: Dependent Is this a change from baseline?: Pre-admission baseline Walks in Home: Needs assistance Is this a change from baseline?: Pre-admission baseline Does the patient have difficulty walking or climbing stairs?: Yes Weakness of Legs: Both Weakness of Arms/Hands: None  Permission Sought/Granted Permission sought to share information with : Case Manager                Emotional Assessment Appearance:: Appears stated age Attitude/Demeanor/Rapport: Engaged Affect (typically observed): Appropriate Orientation: : Oriented to Self, Oriented to Place, Oriented to  Time Alcohol / Substance Use: Not Applicable Psych Involvement: No (comment)  Admission diagnosis:  Precordial chest pain [R07.2] Cocaine use [F14.90] Chest pain [R07.9] Patient Active Problem List   Diagnosis Date Noted   RVF (right ventricular failure) ([name]) 05/16/2023   Azotemia 05/15/2023   Acute on chronic diastolic heart failure ([name]) 05/15/2023   Precordial chest pain 05/15/2023   Acute UTI (urinary tract infection) 04/26/2023   Uremia 04/12/2023   Urinary retention 04/12/2023   AKI (acute kidney injury) ([name]) 03/19/2023   Intertrigo 03/19/2023   Osteoarthritis of right knee 03/19/2023   Acute congestive heart failure ([name])  02/22/2023   Acute on chronic respiratory failure with hypercapnia ([name]) 02/21/2023   Shortness of breath 02/20/2023   Acute exacerbation of CHF (congestive heart failure) ([name]) 11/28/2022   Cocaine use 11/05/2022   Cellulitis of right lower  extremity 06/10/2022   Hypervolemia 06/01/2022   Peripheral artery disease, right leg, mild([name]) 05/15/2022   Osteomyelitis of foot, right, acute ([name]) 05/14/2022   Wound infection 04/09/2022   Adrenal nodule ([name]) 01/07/2022   Iritis    (HFpEF) heart failure with preserved ejection fraction ([name]) 10/19/2021   Impaired mobility 07/07/2021   Chest pain 05/11/2021   Hyperlipidemia associated with type 2 diabetes mellitus ([name]) 01/15/2021   Hypertension associated with type 2 diabetes mellitus ([name]) 01/15/2021   Nonobstructive atherosclerosis of coronary artery 11/26/2020   Urinary incontinence 10/23/2020   Chronic respiratory failure with hypoxia ([name]) 06/14/2020   Constipation 04/03/2020   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[name]) 04/11/2019   Type 2 diabetes mellitus with hyperglycemia ([name]) 04/11/2019   S/P transmetatarsal amputation of foot, right ([name]) 01/25/2019   Allergic rhinitis 03/06/2018   Class 3 severe obesity due to excess calories with serious comorbidity and body mass index (BMI) of 50.0 to 59.9 in adult [facility])    Gout of knee, Right 10/22/2016   Vitamin D deficiency 09/05/2015   Recurrent candidiasis of vagina 09/05/2015   Varicose veins of leg with complications 06/11/2015   Restless leg syndrome 10/17/2014   Chronic sinusitis 07/18/2014   Insomnia 08/14/2013   Encounter for chronic pain management 09/01/2011   Obesity hypoventilation syndrome ([name]) 05/22/2008   Mood disorder ([name]) 05/22/2008   Obstructive sleep apnea (adult) (pediatric) 05/22/2008   Hypertension 05/22/2008   Asthma, chronic 05/22/2008   GERD 05/22/2008   Type 2 diabetes mellitus with diabetic neuropathy, unspecified ([name]) 05/22/2008   PCP:  Latrelle Dodrill, MD Pharmacy:   [facility] [place], [place] - [address] [place] [address] Phone: [phone] Fax: [phone]  [name] [address] [place] [address] Phone:  [phone] Fax: [phone]     Social Determinants of Health (SDOH) Social History: SDOH Screenings   Food Insecurity: Food Insecurity Present (05/15/2023)  Housing: Low Risk  (05/15/2023)  Recent Concern: Housing - Medium Risk (04/27/2023)  Transportation Needs: No Transportation Needs (05/15/2023)  Utilities: Not At Risk (05/15/2023)  Alcohol Screen: Low Risk  (04/14/2023)  Depression (PHQ2-9): Medium Risk (03/07/2023)  Financial Resource Strain: High Risk (04/15/2023)  Stress: Stress Concern Present (02/19/2021)  Tobacco Use: Medium Risk (05/14/2023)   SDOH Interventions:     Readmission Risk Interventions    03/22/2023   10:47 AM 05/24/2022   11:33 AM  Readmission Risk Prevention Plan  Transportation Screening Complete Complete  Medication Review (RN Care Manager) Complete Complete  PCP or Specialist appointment within 3-5 days of discharge Complete Complete  HRI or Home Care Consult Complete Complete  SW Recovery Care/Counseling Consult Complete Complete  Palliative Care Screening Not Applicable Not Applicable  Skilled Nursing Facility Not Applicable Patient Refused

## 2023-05-16 NOTE — Progress Notes (Addendum)
BMP with hyperglycemia 484 and AG 17. Reviewed-pt has chronic elevated AG likely 2/2 to OSA/OHS, renal dysfunction. POC glucose 419 - ordered 7 U novolog given patient asleep/not eating. Recheck in 1 hour-communicated with primary RN. VBG reassuring against DKA pH normal and chronic hypercarbia. Patient wearing BiPAP. Awaiting urine ketones and BHB.  Addendum: BHB normal at 0.12-less likely DKA, repeat glucose 357-will monitor glucose as usual

## 2023-05-16 NOTE — Consult Note (Addendum)
Cardiology Consultation   Patient ID: [name] MRN: [id number]; DOB: August 02, 1976  Admit date: 05/14/2023 Date of Consult: 05/16/2023  PCP:  Latrelle Dodrill, MD   [facility] Providers Cardiologist:  Tonny Bollman, MD  Sleep Medicine:  Armanda Magic, MD     Patient Profile:   [name] is a 47 y.o. female with a hx of HFpEF/RV failure, OSA, Super morbid obesity, uncontrolled DM, CKD IIIb, cocaine use, non-compliance and left leg BKA/right transmetatarsal who is being seen 05/16/2023 for the evaluation of CHF at the request of Dr. Jennette Kettle.  History of Present Illness:   [name] is a 47 yo female with PMH noted above.  She has been followed by Dr. Excell Seltzer.  She has a history of poorly controlled diabetes as well as of recently hypoventilation syndrome and CKD stage IIIb. She is followed by Dr. Arrie Aran at [facility] as well as Dr. Celine Mans with [facility] pulmonology. She was referred to the heart failure clinic by Dr. Excell Seltzer as she had had multiple admissions for diastolic heart failure and management of her volume status had been difficult.  Initially seen by Dr. Excell Seltzer 11/2022 with recommendations for referral to advanced heart failure clinic.  Unfortunately since that time she had multiple hospital admissions and unable to keep scheduled appointment.   Echo 02/2023 with LVEF of 55 to 60%, grade 2 diastolic dysfunction, RV not well-visualized, no significant valvular disease.  She was seen in the [facility] heart failure clinic on 5/29 with Boyce Medici.  She reported compliance with her home O2 and BiPAP as well as diuretics.  At this visit it was recommended that she undergo right heart cath to assess volume status and filling pressures to determine best diuretic regimen.  She was continued on torsemide 100 mg daily +5 mg of metolazone 3 times a week.  Underwent outpatient right heart cath on 6/6 with elevated right heart filling pressures, mixed pulmonary venous/pulmonary arterial  hypertension and low cardiac output/low papi concerning for primarily RV failure.  Her home diuretic regimen was increased to torsemide 100 mg in the a.m. as well as 50 mg in the p.m. with potassium supplement.  Presented to the ED on 6/8 with complaints of increased shortness of breath as well as chest pain.  Admission labs showed sodium 137, potassium 3.2, creatinine 2.19, BUN 143, WBC eight 8.6, hemoglobin 11.2, BNP 19, high-sensitivity troponin 43>>44>>37>>39.  EKG shows sinus tachycardia, 106 bpm.  UDS positive for cocaine.  Chest x-ray with concern for pulmonary vascular congestion.  She was admitted to internal medicine teaching service for further management.  Cardiology asked to evaluate in regards to CHF.  In talking with patient, she reports currently living in a motel, but is able to get her medications. Mother assists with things. She is WC bound. Says she has been compliant with her home torsemide, UOP has been ok at home. Does not weigh herself regularly. Uses metolazone 3x/week.   Past Medical History:  Diagnosis Date   Abscess of groin, left    Acute on chronic diastolic (congestive) heart failure ([name]) 02/05/2022   Acute on chronic respiratory failure with hypoxia ([name]) 04/11/2022   Acute osteomyelitis of ankle or foot, left ([name]) 01/31/2018   Acute respiratory failure with hypoxia and hypercarbia ([name]) 06/15/2020   Acute tubular necrosis ([name]) 07/12/2022   Alveolar hypoventilation    Amputation stump infection ([name]) 04/09/2018   Anemia    not on iron pill   Asthma    Bipolar 2  disorder ([name])    Candidiasis of vagina 05/15/2022   Carpal tunnel syndrome on right    recurrent   Cellulitis 08/2010-08/2011   Chest pain with low risk for cardiac etiology 05/11/2021   Chronic pain    COPD (chronic obstructive pulmonary disease) ([name])    Symbicort daily and Proventil as needed   Costochondritis    De Quervain's tenosynovitis, bilateral 11/01/2015   Depression    Diabetes  mellitus type II, uncontrolled 2000   Type 2, Uncontrolled.Takes Lantus daily.Fasting blood sugar runs 150   Diabetic ulcer of right foot ([name]) 12/19/2021   Drug-seeking behavior    Elevated troponin    Exposure to mold 04/21/2020   Family history of metabolic acidosis with increased anion gap 04/12/2023   GERD (gastroesophageal reflux disease)    takes Pantoprazole and Zantac daily   HLD (hyperlipidemia)    takes Atorvastatin daily   Hypertension    takes Lisinopril and Coreg daily   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  Mild diffuse proximal LAD stenosis with no evidence of obstructive disease (20 to 30% diffuse stenosis);  2.  Left dominant circumflex with widely patent obtuse marginal branches and left PDA branch, no significant stenoses present  3.  Nondominant RCA with mild diffuse nonobstructive stenosis    OSA on CPAP    Peripheral neuropathy    takes Gabapentin daily   Pneumonia    "walking" several yrs ago and as a baby (12/05/2018)   Pneumonia due to COVID-19 virus 04/14/2021   Primary osteoarthritis of first carpometacarpal joint of left hand 07/30/2016   Rectal fissure    Restless leg    Right carpal tunnel syndrome 09/01/2011   SVT (supraventricular tachycardia)    Syncope 02/25/2016   Thrombocytosis 04/11/2022   Urinary frequency    Urinary incontinence 10/23/2020   UTI (urinary tract infection) 04/15/2021   Varicose veins    Right medial thigh and Left leg     Past Surgical History:  Procedure Laterality Date   AMPUTATION Left 02/01/2018   Procedure: LEFT FOURTH AND 5TH TOE RAY AMPUTATION;  Surgeon: Nadara Mustard, MD;  Location: [facility] OR;  Service: Orthopedics;  Laterality: Left;   AMPUTATION Left  03/03/2018   Procedure: LEFT BELOW KNEE AMPUTATION;  Surgeon: Nadara Mustard, MD;  Location: [facility] OR;  Service: Orthopedics;  Laterality: Left;   AMPUTATION Right 05/21/2022   Procedure: REVISION AMPUTATION RAY 1ST;  Surgeon: Candelaria Stagers, DPM;  Location: [facility] OR;  Service: Podiatry;  Laterality: Right;   CARPAL TUNNEL RELEASE Bilateral    CESAREAN SECTION  2007   CORONARY ANGIOGRAPHY N/A 11/26/2020   Procedure: CORONARY ANGIOGRAPHY;  Surgeon: Tonny Bollman, MD;  Location: [facility] INVASIVE CV LAB;  Service: Cardiovascular;  Laterality: N/A;   FOOT AMPUTATION Right    INCISION AND DRAINAGE ABSCESS Left 10/16/2021   Procedure: INCISION AND DRAINAGE ABSCESS;  Surgeon: Fritzi Mandes, MD;  Location: [facility] OR;  Service: General;  Laterality: Left;   INCISION AND DRAINAGE PERIRECTAL ABSCESS Left 05/18/2019   Procedure: IRRIGATION AND DEBRIDEMENT OF PANNIS ABSCESS, POSSIBLE DEBRIDEMENT OF BUTTOCK WOUND;  Surgeon: Manus Rudd, MD;  Location: [facility] OR;  Service: General;  Laterality: Left;   IRRIGATION AND DEBRIDEMENT BUTTOCKS Left 05/17/2019  Procedure: IRRIGATION AND DEBRIDEMENT BUTTOCKS;  Surgeon: Manus Rudd, MD;  Location: [facility] OR;  Service: General;  Laterality: Left;   KNEE ARTHROSCOPY Right 07/17/2010   LEFT HEART CATHETERIZATION WITH CORONARY ANGIOGRAM N/A 07/27/2012   Procedure: LEFT HEART CATHETERIZATION WITH CORONARY ANGIOGRAM;  Surgeon: Tonny Bollman, MD;  Location: [facility] CATH LAB;  Service: Cardiovascular;  Laterality: N/A;   MASS EXCISION N/A 06/29/2013   Procedure:  WIDE LOCAL EXCISION OF POSTERIOR NECK ABSCESS;  Surgeon: Axel Filler, MD;  Location: [facility] OR;  Service: General;  Laterality: N/A;   REPAIR KNEE LIGAMENT Left    "fixed ligaments and chipped patella"   RIGHT HEART CATH N/A 05/12/2023   Procedure: RIGHT HEART CATH;  Surgeon: Laurey Morale, MD;  Location: [facility] INVASIVE CV LAB;  Service: Cardiovascular;  Laterality: N/A;   right transmetatarsal amputation      TRACHEOSTOMY TUBE  PLACEMENT N/A 07/10/2022   Procedure: CONTROLLED EXTUBATION;  Surgeon: Serena Colonel, MD;  Location: [facility] OR;  Service: ENT;  Laterality: N/A;     Home Medications:  Prior to Admission medications   Medication Sig Start Date End Date Taking? Authorizing Provider  acetaminophen (TYLENOL) 325 MG tablet Take 1-2 tablets (325-650 mg total) by mouth every 4 (four) hours as needed for mild pain. 06/04/22   Setzer, Lynnell Jude, PA-C  albuterol (VENTOLIN HFA) 108 (90 Base) MCG/ACT inhaler Inhale 2 puffs into the lungs every 6 (six) hours as needed for wheezing or shortness of breath. 03/28/23   Latrelle Dodrill, MD  amLODipine (NORVASC) 10 MG tablet Take 1 tablet (10 mg total) by mouth daily. 09/27/22   Latrelle Dodrill, MD  aspirin EC 81 MG tablet Take 1 tablet (81 mg total) by mouth daily. Swallow whole. 11/19/20   Tonny Bollman, MD  Brinzolamide-Brimonidine [facility]) 1-0.2 % SUSP Place 1 drop into both eyes in the morning, at noon, and at bedtime.    [provider]  budesonide-formoterol (SYMBICORT) 160-4.5 MCG/ACT inhaler Inhale 2 puffs into the lungs 2 (two) times daily. 03/01/23   Latrelle Dodrill, MD  cetirizine (ZYRTEC) 10 MG tablet Take 1 tablet (10 mg total) by mouth daily as needed for allergies. 02/11/22   Latrelle Dodrill, MD  Continuous Blood Gluc Sensor (DEXCOM G6 SENSOR) MISC Inject 1 applicator into the skin as directed. Change sensor every 10 days. 11/05/21   Latrelle Dodrill, MD  Continuous Blood Gluc Transmit (DEXCOM G6 TRANSMITTER) MISC Inject 1 Device into the skin as directed. Reuse 8 times with sensor changes. 09/03/21   Latrelle Dodrill, MD  diclofenac Sodium (VOLTAREN) 1 % GEL Apply 2 g topically 4 (four) times daily. Patient taking differently: Apply 2 g topically 4 (four) times daily as needed (joint pain). 02/26/23   Fayette Pho, MD  DULoxetine (CYMBALTA) 60 MG capsule TAKE 2 CAPSULES BY MOUTH DAILY 03/28/23   Latrelle Dodrill, MD  gabapentin  (NEURONTIN) 300 MG capsule Take 2 capsules (600 mg total) by mouth at bedtime. 04/29/23 05/29/23  Leatha Gilding, MD  GNP ULTICARE PEN NEEDLES 32G X 4 MM MISC USE TO inject insulin 2 TIMES DAILY 10/02/21   Latrelle Dodrill, MD  insulin aspart (NOVOLOG) 100 UNIT/ML injection Inject 45 Units into the skin 3 (three) times daily before meals.    [provider]  insulin degludec (TRESIBA FLEXTOUCH) 200 UNIT/ML FlexTouch Pen Inject 70 Units into the skin 2 (two) times daily. 04/14/23   Alfredo Martinez, MD  ipratropium-albuterol (DUONEB) 0.5-2.5 (3) MG/3ML SOLN Take  3 mLs by nebulization every 6 (six) hours as needed. 09/24/22   Charlott Holler, MD  latanoprost (XALATAN) 0.005 % ophthalmic solution Place 1 drop into both eyes daily. 02/04/23   [provider]  metolazone (ZAROXOLYN) 5 MG tablet Take 1 tablet (5 mg total) by mouth every Monday, Wednesday, and Friday. 04/29/23   Leatha Gilding, MD  Multiple Vitamins-Minerals (ONE-A-DAY WOMENS) tablet Take 1 tablet by mouth daily.    [provider]  nitroGLYCERIN (NITROSTAT) 0.4 MG SL tablet Place 1 tablet (0.4 mg total) under the tongue every 5 (five) minutes as needed for chest pain. 11/04/22   Latrelle Dodrill, MD  nystatin (MYCOSTATIN/NYSTOP) powder Apply 1 Application topically 3 (three) times daily. 03/07/23   Latrelle Dodrill, MD  omeprazole (PRILOSEC) 40 MG capsule Take 1 capsule (40 mg total) by mouth in the morning and at bedtime. 04/07/23   Charlott Holler, MD  ondansetron (ZOFRAN-ODT) 4 MG disintegrating tablet Take 1 tablet (4 mg total) by mouth every 8 (eight) hours as needed for nausea or vomiting. Patient not taking: Reported on 05/09/2023 04/21/23   Alicia Amel, MD  potassium chloride SA (KLOR-CON M) 20 MEQ tablet Take 1 tablet (20 mEq total) by mouth daily. 05/12/23   Laurey Morale, MD  prednisoLONE acetate (PRED FORTE) 1 % ophthalmic suspension Place 1 drop into both eyes 4 (four) times daily. 04/20/23    [provider]  promethazine (PHENERGAN) 12.5 MG tablet Take 1 tablet (12.5 mg total) by mouth every 6 (six) hours as needed for nausea or vomiting. 04/29/23   Gherghe, Daylene Katayama, MD  RHOPRESSA 0.02 % SOLN Place 1 drop into the left eye daily. 01/21/23   [provider]  rOPINIRole (REQUIP) 1 MG tablet Take 1 tablet (1 mg total) by mouth at bedtime. 12/16/22   Latrelle Dodrill, MD  rosuvastatin (CRESTOR) 10 MG tablet Take 1 tablet (10 mg total) by mouth daily. 04/14/23   Alfredo Martinez, MD  torsemide (DEMADEX) 100 MG tablet Take 1 tablet (100 mg total) by mouth every morning AND 0.5 tablets (50 mg total) every evening. 05/12/23   Laurey Morale, MD  traZODone (DESYREL) 100 MG tablet TAKE 1 TABLET BY MOUTH AT BEDTIME AS NEEDED FOR SLEEP 03/03/23   Latrelle Dodrill, MD  Vitamin D, Cholecalciferol, 25 MCG (1000 UT) CAPS Take 1,000 Units by mouth daily. 10/05/22   Latrelle Dodrill, MD    Inpatient Medications: Scheduled Meds:  amLODipine  10 mg Oral Daily   aspirin EC  81 mg Oral Daily   brinzolamide  1 drop Both Eyes TID   And   brimonidine  1 drop Both Eyes TID   DULoxetine  120 mg Oral Daily   enoxaparin (LOVENOX) injection  80 mg Subcutaneous Q24H   gabapentin  600 mg Oral QHS   insulin aspart  0-20 Units Subcutaneous TID WC   insulin glargine-yfgn  40 Units Subcutaneous BID   latanoprost  1 drop Both Eyes Daily   metolazone  5 mg Oral Q M,W,F   mometasone-formoterol  2 puff Inhalation BID   Netarsudil Dimesylate  1 drop Left Eye Daily   pantoprazole  40 mg Oral Daily   potassium chloride  40 mEq Oral Once   prednisoLONE acetate  1 drop Both Eyes QID   rOPINIRole  1 mg Oral QHS   rosuvastatin  10 mg Oral Daily   torsemide  100 mg Oral Daily   Continuous Infusions:  PRN Meds: acetaminophen, albuterol, ipratropium-albuterol, nitroGLYCERIN  Allergies:    Allergies  Allergen Reactions   Cefepime Other (See Comments)    AKI, see records from [facility]  hospitalization in January 2020.   Other reaction(s): Other (See Comments) "Shut down organs/kidneys"  Note pt has tolerated Rocephin and Keflex Other reaction(s): Not available   Iodine Other (See Comments)    Kidney dysfunction   Kiwi Extract Anaphylaxis, Shortness Of Breath and Swelling   Morphine And Codeine Nausea And Vomiting    Can take with antinausea    Pentoxifylline Nausea And Vomiting    Trental   Cephalosporins Other (See Comments)    Pt states that they cause her kidneys to shut down   Toradol [Ketorolac Tromethamine] Other (See Comments)    Feels like something is crawling on me   Nubain [Nalbuphine Hcl] Other (See Comments)    "FEELS LIKE SOMETHING CRAWLING ON ME" Hallucinations     Social History:   Social History   Socioeconomic History   Marital status: Divorced    Spouse name: Not on file   Number of children: 1   Years of education: Not on file   Highest education level: 10th grade  Occupational History    Comment: SSI  Tobacco Use   Smoking status: Former    Packs/day: 0.25    Years: 15.00    Additional pack years: 0.00    Total pack years: 3.75    Types: Cigarettes    Quit date: 12/06/2005    Years since quitting: 17.4   Smokeless tobacco: Never   Tobacco comments:    smokes for a couple of months  Vaping Use   Vaping Use: Never used  Substance and Sexual Activity   Alcohol use: Not Currently   Drug use: Yes    Types: "Crack" cocaine    Comment: do when stressed out and in pain.   Sexual activity: Yes    Partners: Male  Other Topics Concern   Not on file  Social History Narrative   Lives in [place] with her fiance and 45 yr old dtr.   Social Determinants of Health   Financial Resource Strain: High Risk (04/15/2023)   Overall Financial Resource Strain (CARDIA)    Difficulty of Paying Living Expenses: Hard  Food Insecurity: Food Insecurity Present (05/15/2023)   Hunger Vital Sign    Worried About Running Out of Food in the Last Year: Often  true    Ran Out of Food in the Last Year: Sometimes true  Transportation Needs: No Transportation Needs (05/15/2023)   PRAPARE - Administrator, Civil Service (Medical): No    Lack of Transportation (Non-Medical): No  Physical Activity: Not on file  Stress: Stress Concern Present (02/19/2021)   [employer] of Occupational Health - Occupational Stress Questionnaire    Feeling of Stress : Very much  Social Connections: Not on file  Intimate Partner Violence: Not At Risk (05/15/2023)   Humiliation, Afraid, Rape, and Kick questionnaire    Fear of Current or Ex-Partner: No    Emotionally Abused: No    Physically Abused: No    Sexually Abused: No    Family History:    Family History  Problem Relation Age of Onset   Diabetes Mother    Hyperlipidemia Mother    Depression Mother    GER disease Mother    Allergic rhinitis Mother    Restless legs syndrome Mother    Heart attack Paternal Uncle    Heart  disease Paternal Grandmother    Heart attack Paternal Grandmother    Heart attack Paternal Grandfather    Heart disease Paternal Grandfather    Heart attack Father    Migraines Sister    Cancer Maternal Grandmother        COLON   Hypertension Maternal Grandmother    Hyperlipidemia Maternal Grandmother    Diabetes Maternal Grandmother    Other Maternal Grandfather        GUN SHOT   Anxiety disorder Sister    Asthma Child      ROS:  Please see the history of present illness.   All other ROS reviewed and negative.     Physical Exam/Data:   Vitals:   05/15/23 1904 05/15/23 1910 05/15/23 2004 05/16/23 0405  BP: (!) 87/77 106/85 (!) 163/76 (!) 96/50  Pulse:   (!) 104 95  Resp:   19 16  Temp:    98.6 F (37 C)  TempSrc:   Oral Oral  SpO2:   100% 95%  Weight:      Height:        Intake/Output Summary (Last 24 hours) at 05/16/2023 0944 Last data filed at 05/16/2023 0839 Gross per 24 hour  Intake --  Output 2800 ml  Net -2800 ml      05/14/2023   10:25 PM  05/12/2023    6:00 AM 04/28/2023    5:39 AM  Last 3 Weights  Weight (lbs) 330 lb 11 oz 330 lb 11 oz 330 lb 11 oz  Weight (kg) 150 kg 150 kg 150 kg     Body mass index is 60.48 kg/m.  General:  Morbidly obese young female, wearing [place] HEENT: normal Neck: unable to assess JVD 2/2 neck girth Vascular: No carotid bruits; Distal pulses 2+ bilaterally Cardiac:  distant S1, S2; RRR; no murmur  Lungs:  clear to auscultation bilaterally, diminished in bases Abd: soft, nontender, no hepatomegaly  Ext: Left BKA Skin: warm and dry  Neuro:  CNs 2-12 intact, no focal abnormalities noted Psych:  Normal affect   EKG:  The EKG was personally reviewed and demonstrates:  sinus tachycardia, 106 bpm Telemetry:  Telemetry was personally reviewed and demonstrates:  Sinus rhythm, sinus tachy  Relevant CV Studies:  Echo: 02/2023  IMPRESSIONS     1. Left ventricular ejection fraction, by estimation, is 55 to 60%. The  left ventricle has normal function. Left ventricular endocardial border  not optimally defined to evaluate regional wall motion. Left ventricular  diastolic parameters are consistent  with Grade II diastolic dysfunction (pseudonormalization).   2. Right ventricular systolic function was not well visualized. The right  ventricular size is not well visualized. Tricuspid regurgitation signal is  inadequate for assessing PA pressure.   3. The mitral valve was not well visualized. No evidence of mitral valve  regurgitation.   4. The aortic valve was not well visualized. Aortic valve regurgitation  is not visualized. No aortic stenosis is present.   5. The inferior vena cava is dilated in size with <50% respiratory  variability, suggesting right atrial pressure of 15 mmHg.   6. Technically difficult study with very poor acoustic windows. The right  side of the heart was minimally visualized.   FINDINGS   Left Ventricle: Left ventricular ejection fraction, by estimation, is 55  to 60%. The  left ventricle has normal function. Left ventricular  endocardial border not optimally defined to evaluate regional wall motion.  The left ventricular internal cavity  size was normal  in size. There is no left ventricular hypertrophy. Left  ventricular diastolic parameters are consistent with Grade II diastolic  dysfunction (pseudonormalization).   Right Ventricle: The right ventricular size is not well visualized. Right  vetricular wall thickness was not well visualized. Right ventricular  systolic function was not well visualized. Tricuspid regurgitation signal  is inadequate for assessing PA  pressure.   Left Atrium: Left atrial size was not well visualized.   Right Atrium: Right atrial size was not well visualized.   Pericardium: There is no evidence of pericardial effusion.   Mitral Valve: The mitral valve was not well visualized. No evidence of  mitral valve regurgitation.   Tricuspid Valve: The tricuspid valve is not well visualized. Tricuspid  valve regurgitation is not demonstrated.   Aortic Valve: The aortic valve was not well visualized. Aortic valve  regurgitation is not visualized. No aortic stenosis is present.   Pulmonic Valve: The pulmonic valve was normal in structure. Pulmonic valve  regurgitation is not visualized.   Aorta: The aortic root is normal in size and structure.   Venous: The inferior vena cava is dilated in size with less than 50%  respiratory variability, suggesting right atrial pressure of 15 mmHg.   IAS/Shunts: The interatrial septum was not well visualized.    Laboratory Data:  High Sensitivity Troponin:   Recent Labs  Lab 04/26/23 0025 05/14/23 2240 05/15/23 0100 05/15/23 1942 05/15/23 2351  TROPONINIHS 34* 43* 44* 37* 39*     Chemistry Recent Labs  Lab 05/14/23 2240 05/15/23 0516 05/15/23 2351  NA 137 135 130*  K 3.2* 3.7 3.8  CL 85* 88* 82*  CO2 34* 32 31  GLUCOSE 243* 244* 484*  BUN 143* 146* 157*  CREATININE 2.19*  2.55* 2.79*  CALCIUM 9.2 8.8* 8.8*  MG  --   --  2.0  GFRNONAA 28* 23* 21*  ANIONGAP 18* 15 17*    No results for input(s): "PROT", "ALBUMIN", "AST", "ALT", "ALKPHOS", "BILITOT" in the last 168 hours. Lipids No results for input(s): "CHOL", "TRIG", "HDL", "LABVLDL", "LDLCALC", "CHOLHDL" in the last 168 hours.  Hematology Recent Labs  Lab 05/12/23 0811 05/14/23 2240 05/15/23 2351  WBC  --  8.6 8.8  RBC  --  3.81* 3.67*  HGB 12.2  11.9* 11.2* 10.6*  HCT 36.0  35.0* 34.8* 33.3*  MCV  --  91.3 90.7  MCH  --  29.4 28.9  MCHC  --  32.2 31.8  RDW  --  16.4* 16.8*  PLT  --  329 333   Thyroid No results for input(s): "TSH", "FREET4" in the last 168 hours.  BNP Recent Labs  Lab 05/14/23 2240  BNP 19.3    DDimer No results for input(s): "DDIMER" in the last 168 hours.   Radiology/Studies:  DG Chest 2 View  Result Date: 05/14/2023 CLINICAL DATA:  Left-sided chest pain. EXAM: CHEST - 2 VIEW COMPARISON:  None Available. FINDINGS: The heart size and mediastinal contours are within normal limits. Prominence of the perihilar pulmonary vasculature is seen. Low lung volumes are noted. There is no evidence of an acute infiltrate, pleural effusion or pneumothorax. The visualized skeletal structures are unremarkable. IMPRESSION: Low lung volumes with mild pulmonary vascular congestion. Electronically Signed   By: Aram Candela M.D.   On: 05/14/2023 23:51     Assessment and Plan:   Alysandra Losi is a 47 y.o. female with a hx of HFpEF/RV failure, OSA, Super morbid obesity, uncontrolled DM, CKD IIIb, cocaine use, non-compliance and  left leg BKA/right transmetatarsal who is being seen 05/16/2023 for the evaluation of CHF at the request of Dr. Jennette Kettle.  HFpEF RV failure OSA  Chronic hypoxic respiratory failure -- underwent recent RHC 6/6 with low cardiac output CI 1.83, low PAPi of 1.4 with concern for primary RV failure. Torsemide was increased from 100mg  daily to 100mg /am and 50mg /pm -- on  3-4 L  at all times, Bipap at night, reports having to increase her O2 to 4L recently -- presented with chest pain and worsening shortness of breath -- CXR low lung volume, mild pulmonary congestion -- received IV lasix 60mg  x1, 2.2L UOP thus far -- start IV lasix 80mg  BID -- given her worsen renal disease, would rec consulting nephrology for further guidance   CKD IIIb -- Cr baseline around 1.8-2.4, noted at 2.79 today. BUN up to 157 -- as above, nephrology consult  Uncontrolled DM -- Hgb A1c 13.1 -- on SSI -- per primary  Cocaine use Non- compliance  -- UDS + cocaine    Risk Assessment/Risk Scores:   New York Heart Association (NYHA) Functional Class NYHA Class III   For questions or updates, please contact [facility] Please consult [email or web address] for contact info under    Signed, Laverda Page, NP  05/16/2023 9:44 AM  Patient seen and examined.  Agree with above documentation.  [name] is a 47 year old female with a history of chronic diastolic heart failure/RV failure, OHS/OSA, morbid obesity, uncontrolled diabetes, cocaine use, chronic kidney disease, status post left leg BKA/right transmetatarsal amputation who were consulted by Dr. Jennette Kettle for evaluation of heart failure.  She has had multiple admissions with decompensated heart failure.  Echo 02/2023 showed EF 55 to 60%, grade 2 diastolic dysfunction, RV not well-visualized, no significant valvular disease.  She was seen in heart failure clinic on 5/29, RHC was ordered to better evaluate her volume status.  RHC on 05/12/2023 showed significantly elevated filling pressures (RA 19, RV 58/19, PA 60/30/47, PCWP 23, CI 1.8).  Felt to have significant RV failure and torsemide was increased to 100 every morning/50 nightly but suspected she would likely need HD soon based on renal dysfunction (BUN up to 150).  On presentation to ED on 6/9, vital signs notable for BP 100/62, pulse 97, SpO2 95% on 10 L high flow nasal  cannula.  Labs notable for creatinine 2.55 > 2.79, BUN 157, sodium 130, troponin 37 > 39, hemoglobin 10.6, UDS positive for cocaine.   Chest x-ray shows low lung volumes with mild pulmonary vascular congestion.  EKG shows sinus tachycardia, rate 103, no ST abnormalities, QTc 518.  On exam, patient is alert and oriented, regular rate and rhythm, no murmurs, diminished breath sounds, JVD difficult to assess given habitus, status post left BKA.  For her acute on chronic diastolic heart failure/RV failure, she had good response to IV Lasix overnight.  Volume status difficult to assess on exam but given recent RHC findings, suspect significantly volume overloaded.  Recommend IV Lasix 80 mg twice daily.  BUN is up to 157.  Suspect may need HD soon.  Recommend nephrology consult.  Belinda Ishikawa, MD

## 2023-05-16 NOTE — Assessment & Plan Note (Signed)
Hospitalized 2x  last month, first for uremia with unclear etiology and then for AKI on CKD 4 which improved with IV fluids and decreasing torsemide and metolazone.  Discharged with plans to follow-up with nephrology outpatient. BUN remains elevated. Creatine is 2.79 and seems to have a new baseline around 2.25 in the last month. Suspect worsening kidney function. -nephrology consulted, appreciate recs -monitor BMP

## 2023-05-16 NOTE — Assessment & Plan Note (Addendum)
P/w chest pain after RHC on 6/6. ACS workup unremarkable. Pt denies chest pain this morning. Recent cocaine use. -Appreciate HF team recs -lasix 80mg  bid -cardiac monitoring

## 2023-05-16 NOTE — Progress Notes (Signed)
Daily Progress Note Intern Pager: [phone]  Patient name: [name] Medical record number: [id number] Date of birth: 09-Jan-1976 Age: 47 y.o. Gender: female  Primary Care Provider: Latrelle Dodrill, MD Consultants: nephrology, heart failure Code Status: full  Pt Overview and Major Events to Date:  6/9 admitted  Assessment and Plan:  [name] is a 47 y.o. female p/w chest pain in the setting of recent RHC, heart failure, significant kidney disease, diabetes, and OSA.  Active Hospital Problems   *Chest pain           P/w chest pain after RHC on 6/6. ACS workup           unremarkable. Pt denies chest pain this morning.           Recent cocaine use.           -Appreciate HF team recs           -lasix 80mg  bid           -cardiac monitoring    Type 2 diabetes mellitus with hyperglycemia ([name])           Poorly controlled DM with most recent A1c of 13.5           a month ago. CBG on admission over 200. Home meds           include Tresiba 70 units twice daily and NovoLog           45u 3 times daily.           -Semglee 40 units BID, adjust as needed           -Novolog 10u TID WC           -Resistant SSI with CBGs before every meal and           nightly    Acute on chronic diastolic heart failure ([name])           Last echo 3/24 with EF 55-60%.  Follows with           cardiology.  Had RHC on 6/6 showing pulmonary           hypertension, RV failure.  Home meds include           torsemide 100 mg in the morning and 50 mg in the           evening, metolazone 5 mg 3 days/week.  Difficult           to assess volume status due to body habitus but           suspect abdominal fluid retain given  tightness on           exam.  CXR shows mild            vascular congestion.            -Heart Failure team consulted, appreciate recs           -IV Lasix 80mg  BID           -home metolazone           -Strict I/os           -Daily weights    Obstructive sleep apnea (adult)  (pediatric)        Chronic           Morbidly obese patient on 3-4 L [place] at baseline.  Will continue oxygen            supplementation and BiPAP nightly           -Continue Oxygen supplement           -BiPAP nightly    RVF (right ventricular failure) ([name])    Azotemia           Hospitalized 2x  last month, first for uremia with           unclear etiology and then for AKI on CKD 4 which           improved with IV fluids and decreasing torsemide           and metolazone.  Discharged with plans to           follow-up with nephrology outpatient. BUN remains           elevated. Creatine is 2.79 and seems to have a new           baseline around 2.25 in the last month. Suspect           worsening kidney function.           -nephrology consulted, appreciate recs           -monitor BMP    Precordial chest pain   Resolved Hospital Problems No resolved problems to display.    FEN/GI: reg diet (refused carb modified diet) PPx: lovenox Dispo:pending management as above  Subjective:  Denies chest pain this morning. On BiPAP overnight  Objective: Temp:  [97.9 F (36.6 C)-98.6 F (37 C)] 97.9 F (36.6 C) (06/10 1127) Pulse Rate:  [95-104] 95 (06/10 0405) Resp:  [16-19] 16 (06/10 1127) BP: (87-163)/(50-93) 158/77 (06/10 1127) SpO2:  [95 %-100 %] 95 % (06/10 0405) Physical Exam: General: NAD, on BiPAP Cardiovascular: RRR Respiratory: diminished, on BiPAP, difficult to appreciate Abdomen: tight, nontender Extremities: LLE BKA, RLE TMA  Laboratory: Most recent CBC Lab Results  Component Value Date   WBC 8.8 05/15/2023   HGB 10.6 (L) 05/15/2023   HCT 33.3 (L) 05/15/2023   MCV 90.7 05/15/2023   PLT 333 05/15/2023   Most recent BMP    Latest Ref Rng & Units 05/15/2023   11:51 PM  BMP  Glucose 70 - 99 mg/dL 191   BUN 6 - 20 mg/dL 478   Creatinine 2.95 - 1.00 mg/dL 6.21   Sodium 308 - 657 mmol/L 130   Potassium 3.5 - 5.1 mmol/L 3.8   Chloride 98 - 111 mmol/L 82    CO2 22 - 32 mmol/L 31   Calcium 8.9 - 10.3 mg/dL 8.8      Vonna Drafts, MD 05/16/2023, 2:52 PM  PGY-1, [facility] FPTS Intern pager: [phone], text pages welcome Secure chat group [facility] Teaching Service

## 2023-05-16 NOTE — Progress Notes (Signed)
C/o CP 8/10. MD notified, called for stat EKG. Administered NTG x 1 sl, second tab held due to drop in SBP by .

## 2023-05-16 NOTE — Progress Notes (Signed)
Currently pt wishes to continue to rest on bipap and pt does not want to take inhaler at this time.  No distress noted.

## 2023-05-16 NOTE — Progress Notes (Signed)
FMTS Interim Progress Note  S:Received secure chat that patient feeling SOB, nauseous, having leg pain, and feels like she can't pee. Went to assess patient w/ Dr. Denny Levy. Patient feels like she needs to pee but cannot start, also feels like she's having a difficult time breathing, also feels like legs have been hurting her.   O: BP (!) 158/77 (BP Location: Right Wrist)   Pulse 95   Temp 97.9 F (36.6 C) (Oral)   Resp 16   Ht 5\' 2"  (1.575 m)   Wt (!) 150 kg   SpO2 95%   BMI 60.48 kg/m   General: Upset, NAD, conversant, chronically ill appearing Resp: Good WOB, speaking in full sentences, CTABL Card: Distant heart sounds Ext: no erythema/lesion, w/ pitting edema in RLE  A/P: SOB Patient w/ comfortable work of breathing, sating well on [place], good WOB and air movement. Suspect this could be related to OHS. Will CTM -Consider BiPAP if dyspnea increases/worsens  Leg pain Patient appreciates some leg pain, exam benign, will treat w/ robaxim and consider dilaudid if no relief. Patient report's she just took medication -Consider dilaudid if leg pain does not improve -Sched tylenol  Nausea Patient reports she has some nausea that is best treated w/ phenergan. Patient Qtc 499 yesterday, and 518 ealier in the day. Will obtain EKG. If Qtc allows, will give antiemetic -EKG  -Consider Phenergan   Concern for retention Patient appreciating that she has not peed and feels like she needs to. Will obtain bladder scan and I/O cath for vol > 300 ml.  -Bladder scan -I/O cath if volume > 300  Bess Kinds, MD 05/16/2023, 2:56 PM PGY-2, [facility] Family Medicine Service pager [phone]

## 2023-05-16 NOTE — Assessment & Plan Note (Addendum)
Poorly controlled DM with most recent A1c of 13.5 a month ago. CBG on admission over 200. Home meds include Tresiba 70 units twice daily and NovoLog 45u 3 times daily. -Semglee 40 units BID, adjust as needed -Novolog 10u TID WC -Resistant SSI with CBGs before every meal and nightly

## 2023-05-16 NOTE — Progress Notes (Signed)
Came to room for inhaler, pt currently refused inhaler.  Noted pt is on [place]  and denies SOB, no distress noted.

## 2023-05-16 NOTE — Assessment & Plan Note (Signed)
Morbidly obese patient on 3-4 L [place] at baseline. Will continue oxygen supplementation and BiPAP nightly -Continue Oxygen supplement -BiPAP nightly

## 2023-05-16 NOTE — Inpatient Diabetes Management (Addendum)
Inpatient Diabetes Program Recommendations  AACE/ADA: New Consensus Statement on Inpatient Glycemic Control (2015)  Target Ranges:  Prepandial:   less than 140 mg/dL      Peak postprandial:   less than 180 mg/dL (1-2 hours)      Critically ill patients:  140 - 180 mg/dL   Lab Results  Component Value Date   GLUCAP 310 (H) 05/16/2023   HGBA1C 13.6 (H) 04/13/2023    Review of Glycemic Control  Latest Reference Range & Units 05/15/23 09:48 05/15/23 12:12 05/15/23 16:47 05/16/23 03:23 05/16/23 04:28 05/16/23 08:26  Glucose-Capillary 70 - 99 mg/dL 161 (H) 096 (H) 045 (H) 419 (H) 357 (H) 310 (H)   Diabetes history: DM  Outpatient Diabetes medications:  Dexcom sensor Novolog 45 units tid with meals Tresiba 70 units bid Current orders for Inpatient glycemic control:  Novolog 0-20 units tid with meals Semglee 40 units bid  Inpatient Diabetes Program Recommendations:    Please consider adding CHO modified to diet and also please add Novolog meal coverage 10 units tid with  meals (hold if patient eats less than 50% or NPO).   Patient was seen by DM coordinator on 04/27/23 due to high A1C.  Needs close f/u with [facility] endocrinology.    Thanks,  Beryl Meager, RN, BC-ADM Inpatient Diabetes Coordinator Pager [phone]  (8a-5p)

## 2023-05-16 NOTE — Progress Notes (Signed)
FMTS Interim Progress Note  S:Messaged by RN that patient was having 8/10 chest pain. In with Dr. Laroy Apple to evaluate patient. Patient crying and visibly upset. Complaining primarily about diffuse sharp pain in both legs. Chest pain is also sharp but located over breast on left side. Patient also asking for nausea medication.  O: BP (!) 158/77 (BP Location: Right Wrist)   Pulse 95   Temp 97.9 F (36.6 C) (Oral)   Resp 16   Ht 5\' 2"  (1.575 m)   Wt (!) 150 kg   SpO2 95%   BMI 60.48 kg/m   General: NAD, well appearing, crying Neuro: A&O Cardiovascular: RRR, no murmurs, no peripheral edema, chest pain not reproducible with palpation Respiratory: normal WOB, CTAB, no wheezes, ronchi or rales Extremities: Moving all 4 extremities equally,    A/P: Chest pain/HF Patient with complicated cardiovascular history, and recurrent chest pain due to cocaine use. ACS work-up has been unremarkable thus far. Vital signs are reassuringly stable, and patient is overall well appearing.  -HF team following, appreciate recs -Continue Lasix 80mg  BID -Cardiac monitoring -Repeat EKG, and trend troponins -Phenergan x1  Remainder of plan per day team. Orders and labs reviewed for AM.  Celine Mans, MD 05/16/2023, 7:27 PM PGY-1, [facility] Family Medicine Service pager [phone]

## 2023-05-16 NOTE — Telephone Encounter (Signed)
Faxed paperwork to [employer] at number provided.   Called Bianca and LVM with CAP program to determine what all is needed with order for lift chair. Will await response.   Veronda Prude, RN

## 2023-05-16 NOTE — Telephone Encounter (Signed)
[name] returned call to nurse line.   She states that they will need a PT consult note to move forward with the lift chair.   Patient is currently hospitalized and has follow up scheduled with PCP on 6/17.  Per chart review, PT referral was placed back on 03/22/23, however, new referral is needed due to patient's hospitalizations.   Will forward to PCP.   Veronda Prude, RN

## 2023-05-16 NOTE — Progress Notes (Signed)
Asked patient to have family bring her prosthesis so that she can get OOB while in hospital and avoid decline in muscle strength, patient stating "that will be difficult."

## 2023-05-17 DIAGNOSIS — I5033 Acute on chronic diastolic (congestive) heart failure: Secondary | ICD-10-CM | POA: Diagnosis not present

## 2023-05-17 DIAGNOSIS — I50810 Right heart failure, unspecified: Secondary | ICD-10-CM | POA: Diagnosis not present

## 2023-05-17 DIAGNOSIS — R7989 Other specified abnormal findings of blood chemistry: Secondary | ICD-10-CM | POA: Diagnosis not present

## 2023-05-17 DIAGNOSIS — R072 Precordial pain: Secondary | ICD-10-CM | POA: Diagnosis not present

## 2023-05-17 LAB — GLUCOSE, CAPILLARY
Glucose-Capillary: 297 mg/dL — ABNORMAL HIGH (ref 70–99)
Glucose-Capillary: 365 mg/dL — ABNORMAL HIGH (ref 70–99)
Glucose-Capillary: 354 mg/dL — ABNORMAL HIGH (ref 70–99)
Glucose-Capillary: 332 mg/dL — ABNORMAL HIGH (ref 70–99)

## 2023-05-17 LAB — BASIC METABOLIC PANEL
Anion gap: 13 (ref 5–15)
BUN: 151 mg/dL — ABNORMAL HIGH (ref 6–20)
CO2: 35 mmol/L — ABNORMAL HIGH (ref 22–32)
Calcium: 9 mg/dL (ref 8.9–10.3)
Chloride: 84 mmol/L — ABNORMAL LOW (ref 98–111)
Creatinine, Ser: 2.36 mg/dL — ABNORMAL HIGH (ref 0.44–1.00)
GFR, Estimated: 25 mL/min — ABNORMAL LOW (ref >60)
Glucose, Bld: 366 mg/dL — ABNORMAL HIGH (ref 70–99)
Potassium: 3.1 mmol/L — ABNORMAL LOW (ref 3.5–5.1)
Sodium: 132 mmol/L — ABNORMAL LOW (ref 135–145)

## 2023-05-17 LAB — PHOSPHORUS: Phosphorus: 4.8 mg/dL — ABNORMAL HIGH (ref 2.5–4.6)

## 2023-05-17 LAB — IRON AND TIBC
Iron: 43 ug/dL (ref 28–170)
Saturation Ratios: 10 % — ABNORMAL LOW (ref 10.4–31.8)
TIBC: 437 ug/dL (ref 250–450)
UIBC: 394 ug/dL

## 2023-05-17 LAB — CBC
HCT: 31.1 % — ABNORMAL LOW (ref 36.0–46.0)
Hemoglobin: 9.9 g/dL — ABNORMAL LOW (ref 12.0–15.0)
MCH: 28.5 pg (ref 26.0–34.0)
MCHC: 31.8 g/dL (ref 30.0–36.0)
MCV: 89.6 fL (ref 80.0–100.0)
Platelets: 317 10*3/uL (ref 150–400)
RBC: 3.47 MIL/uL — ABNORMAL LOW (ref 3.87–5.11)
RDW: 16.5 % — ABNORMAL HIGH (ref 11.5–15.5)
WBC: 8.8 10*3/uL (ref 4.0–10.5)
nRBC: 0.2 % (ref 0.0–0.2)

## 2023-05-17 LAB — MAGNESIUM: Magnesium: 2.1 mg/dL (ref 1.7–2.4)

## 2023-05-17 LAB — FERRITIN: Ferritin: 111 ng/mL (ref 11–307)

## 2023-05-17 MED ORDER — METOLAZONE 5 MG PO TABS
5.0000 mg | ORAL_TABLET | Freq: Once | ORAL | Status: AC
  Administered 2023-05-17: 5 mg via ORAL
  Filled 2023-05-17: qty 1

## 2023-05-17 MED ORDER — INSULIN GLARGINE-YFGN 100 UNIT/ML ~~LOC~~ SOLN
50.0000 [IU] | Freq: Two times a day (BID) | SUBCUTANEOUS | Status: DC
  Administered 2023-05-17 – 2023-05-18 (×2): 50 [IU] via SUBCUTANEOUS
  Filled 2023-05-17 (×3): qty 0.5

## 2023-05-17 MED ORDER — POTASSIUM CHLORIDE CRYS ER 20 MEQ PO TBCR
40.0000 meq | EXTENDED_RELEASE_TABLET | ORAL | Status: DC
  Filled 2023-05-17: qty 2

## 2023-05-17 MED ORDER — DICLOFENAC SODIUM 1 % EX GEL
2.0000 g | Freq: Two times a day (BID) | CUTANEOUS | Status: DC | PRN
  Administered 2023-05-19 – 2023-05-24 (×6): 2 g via TOPICAL
  Filled 2023-05-17: qty 100

## 2023-05-17 MED ORDER — POTASSIUM CHLORIDE 20 MEQ PO PACK: 40.0000 meq | PACK | Freq: Once | ORAL | Status: DC

## 2023-05-17 MED ORDER — POTASSIUM CHLORIDE 10 MEQ/100ML IV SOLN
10.0000 meq | INTRAVENOUS | Status: AC
  Administered 2023-05-17 (×6): 10 meq via INTRAVENOUS
  Filled 2023-05-17 (×6): qty 100

## 2023-05-17 MED ORDER — SODIUM CHLORIDE 0.9 % IV SOLN
12.5000 mg | Freq: Once | INTRAVENOUS | Status: AC
  Administered 2023-05-17: 12.5 mg via INTRAVENOUS
  Filled 2023-05-17: qty 12.5

## 2023-05-17 MED ORDER — SODIUM CHLORIDE 0.9 % IV SOLN
250.0000 mg | Freq: Every day | INTRAVENOUS | Status: AC
  Administered 2023-05-17 – 2023-05-19 (×4): 250 mg via INTRAVENOUS
  Filled 2023-05-17 (×4): qty 20

## 2023-05-17 MED ORDER — POTASSIUM CHLORIDE CRYS ER 20 MEQ PO TBCR
40.0000 meq | EXTENDED_RELEASE_TABLET | Freq: Once | ORAL | Status: DC
  Filled 2023-05-17: qty 2

## 2023-05-17 MED ORDER — INSULIN ASPART 100 UNIT/ML IJ SOLN
13.0000 [IU] | Freq: Three times a day (TID) | INTRAMUSCULAR | Status: DC
  Administered 2023-05-17 – 2023-05-18 (×3): 13 [IU] via SUBCUTANEOUS

## 2023-05-17 NOTE — Telephone Encounter (Signed)
We could ask the inpatient team to have physical therapy see her in the hospital. Are there specific items that need to be documented in the physical therapy consult note for her to qualify for a lift chair?  Thanks Latrelle Dodrill, MD

## 2023-05-17 NOTE — Progress Notes (Signed)
Daily Progress Note Intern Pager: [phone]  Patient name: [name] Medical record number: [id number] Date of birth: October 03, 1976 Age: 47 y.o. Gender: female  Primary Care Provider: Latrelle Dodrill, MD Consultants: nephrology, heart failure Code Status: full   Pt Overview and Major Events to Date:  6/9 admitted   Assessment and Plan:   [name] is a 47 y.o. female p/w chest pain in the setting of recent RHC, heart failure, significant kidney disease, diabetes, and OSA.  Active Hospital Problems   *Chest pain           P/w chest pain after RHC on 6/6. ACS workup           unremarkable. Recent cocaine use.           -Appreciate HF team recs           -lasix 80mg  bid           -cardiac monitoring    Type 2 diabetes mellitus with hyperglycemia ([name])           Poorly controlled DM with most recent A1c of 13.5           a month ago. Home meds include Tresiba 70 units           twice daily and NovoLog 45u 3 times daily.           -Semglee 50 units BID, adjust as needed           -Novolog 13u TID WC           -Resistant SSI with CBGs before every meal and           nightly    Acute on chronic diastolic heart failure ([name])           Last echo 3/24 with EF 55-60%.  Follows with           cardiology.  Had RHC on 6/6 showing pulmonary           hypertension, RV failure.  Home meds include           torsemide 100 mg in the morning and 50 mg in the           evening, metolazone 5 mg 3 days/week.  Difficult           to assess volume status due to body habitus but           suspect abdominal fluid retain given  tightness on           exam.  CXR shows mild vascular congestion. -           overnight.           -Heart Failure team consulted, appreciate recs           -IV Lasix 80mg  BID           -home metolazone           -Strict I/os           -Daily weights    Obstructive sleep apnea (adult) (pediatric)        Chronic           Morbidly obese patient on 3-4 L  [place] at baseline.           Will continue oxygen            supplementation and BiPAP nightly           -  Continue Oxygen supplement           -BiPAP nightly    RVF (right ventricular failure) ([name])    Azotemia           Hospitalized 2x  last month, first for uremia with           unclear etiology and then for AKI on CKD 4 which           improved with IV fluids and decreasing torsemide           and metolazone.  Discharged with plans to           follow-up with nephrology outpatient. BUN remains           elevated.  Creatinine 2.36 today. Suspect           worsening kidney function.           -nephrology consulted, appreciate recs           -monitor BMP    Precordial chest pain   Resolved Hospital Problems No resolved problems to display.    FEN/GI: reg diet (refused carb modified diet) PPx: lovenox Dispo:pending management as above  Subjective:  States she did not sleep well overnight and still has some diffuse pain this morning.  Wearing BiPAP  Objective: Temp:  [97.9 F (36.6 C)-98 F (36.7 C)] 98 F (36.7 C) (06/10 2008) Pulse Rate:  [87-103] 87 (06/11 0414) Resp:  [16-24] 18 (06/11 1005) BP: (126-171)/(64-100) 131/69 (06/11 0414) SpO2:  [97 %-100 %] 100 % (06/11 0414) FiO2 (%):  [4 %] 4 % (06/11 1005) Physical Exam: General: NAD Cardiovascular: RRR Respiratory: Difficult to appreciate.  Wearing BiPAP Abdomen: Tight, nontender Extremities: SCDs in place  Laboratory: Most recent CBC Lab Results  Component Value Date   WBC 8.8 05/17/2023   HGB 9.9 (L) 05/17/2023   HCT 31.1 (L) 05/17/2023   MCV 89.6 05/17/2023   PLT 317 05/17/2023   Most recent BMP    Latest Ref Rng & Units 05/17/2023    2:36 AM  BMP  Glucose 70 - 99 mg/dL 161   BUN 6 - 20 mg/dL 096   Creatinine 0.45 - 1.00 mg/dL 4.09   Sodium 811 - 914 mmol/L 132   Potassium 3.5 - 5.1 mmol/L 3.1   Chloride 98 - 111 mmol/L 84   CO2 22 - 32 mmol/L 35   Calcium 8.9 - 10.3 mg/dL 9.0    Ferritin  782 Iron 43 Magnesium 2.1 Phosphorus 4.8   Vonna Drafts, MD 05/17/2023, 11:12 AM  PGY-1, [facility] FPTS Intern pager: [phone], text pages welcome Secure chat group [facility] Teaching Service

## 2023-05-17 NOTE — Social Work (Addendum)
CSW received consult for patient. CSW spoke with patient at bedside. Patient reports PTA she comes from home with daughter. Patient reports that she will need PTAR transport home. CSW offered patient food resources,psychiatry and counseling resources,[employer], and outpatient substance use treatment resources. Patient accepted all resources. All questions answered. No further questions reported at this time.

## 2023-05-17 NOTE — Assessment & Plan Note (Signed)
Hospitalized 2x  last month, first for uremia with unclear etiology and then for AKI on CKD 4 which improved with IV fluids and decreasing torsemide and metolazone.  Discharged with plans to follow-up with nephrology outpatient. BUN remains elevated.  Creatinine 2.36 today. Suspect worsening kidney function. -nephrology consulted, appreciate recs -monitor BMP

## 2023-05-17 NOTE — Assessment & Plan Note (Signed)
Morbidly obese patient on 3-4 L [place] at baseline. Will continue oxygen supplementation and BiPAP nightly -Continue Oxygen supplement -BiPAP nightly

## 2023-05-17 NOTE — Progress Notes (Addendum)
Rounding Note    Patient Name: [name] Date of Encounter: 05/17/2023  [facility] Cardiologist: Tonny Bollman, MD   Subjective   Pt continues to report SOB.   Inpatient Medications    Scheduled Meds:  acetaminophen  325 mg Oral Q4H   amLODipine  10 mg Oral Daily   aspirin EC  81 mg Oral Daily   brinzolamide  1 drop Both Eyes TID   And   brimonidine  1 drop Both Eyes TID   DULoxetine  120 mg Oral Daily   enoxaparin (LOVENOX) injection  80 mg Subcutaneous Q24H   furosemide  80 mg Intravenous BID   gabapentin  600 mg Oral QHS   insulin aspart  0-20 Units Subcutaneous TID WC   insulin aspart  10 Units Subcutaneous TID AC   insulin glargine-yfgn  40 Units Subcutaneous BID   latanoprost  1 drop Both Eyes Daily   metolazone  5 mg Oral Q M,W,F   metolazone  5 mg Oral Once   mometasone-formoterol  2 puff Inhalation BID   Netarsudil Dimesylate  1 drop Left Eye Daily   pantoprazole  40 mg Oral Daily   potassium chloride  40 mEq Oral Q4H   prednisoLONE acetate  1 drop Both Eyes QID   rOPINIRole  1 mg Oral QHS   rosuvastatin  10 mg Oral Daily   Continuous Infusions:  ferric gluconate (FERRLECIT) IVPB     promethazine (PHENERGAN) injection (IM or IVPB)     PRN Meds: albuterol, ipratropium-albuterol, nitroGLYCERIN   Vital Signs    Vitals:   05/16/23 1935 05/16/23 2008 05/16/23 2234 05/17/23 0414  BP: (!) 126/100 132/71  131/69  Pulse: 97 96 91 87  Resp:  18 (!) 24 17  Temp:  98 F (36.7 C)    TempSrc:  Axillary    SpO2: 97% 100% 99% 100%  Weight:      Height:        Intake/Output Summary (Last 24 hours) at 05/17/2023 0916 Last data filed at 05/17/2023 0513 Gross per 24 hour  Intake 530 ml  Output 825 ml  Net -295 ml      05/14/2023   10:25 PM 05/12/2023    6:00 AM 04/28/2023    5:39 AM  Last 3 Weights  Weight (lbs) 330 lb 11 oz 330 lb 11 oz 330 lb 11 oz  Weight (kg) 150 kg 150 kg 150 kg      Telemetry    Sinus rhythm in the 80s -  Personally Reviewed  ECG    No new tracings - Personally Reviewed  Physical Exam   Overall, body  habitus difficult to determine fluid status GEN: No acute distress.  Morbidly obese Neck: ? JVD Cardiac: RRR, no murmurs, rubs, or gallops.  Respiratory: Clear to auscultation bilaterally. GI: Soft, nontender, non-distended  MS: left BKA, right transmetatarsal ampuation Neuro:  Nonfocal  Psych: Normal affect   Labs    High Sensitivity Troponin:   Recent Labs  Lab 05/15/23 0100 05/15/23 1942 05/15/23 2351 05/16/23 2044 05/16/23 2212  TROPONINIHS 44* 37* 39* 36* 38*     Chemistry Recent Labs  Lab 05/15/23 2351 05/16/23 1422 05/16/23 2212 05/17/23 0236  NA 130*  --  130* 132*  K 3.8  --  3.6 3.1*  CL 82*  --  82* 84*  CO2 31  --  32 35*  GLUCOSE 484* 428* 480* 366*  BUN 157*  --  153* 151*  CREATININE 2.79*  --  2.44* 2.36*  CALCIUM 8.8*  --  8.8* 9.0  MG 2.0  --   --  2.1  GFRNONAA 21*  --  24* 25*  ANIONGAP 17*  --  16* 13    Lipids No results for input(s): "CHOL", "TRIG", "HDL", "LABVLDL", "LDLCALC", "CHOLHDL" in the last 168 hours.  Hematology Recent Labs  Lab 05/14/23 2240 05/15/23 2351 05/17/23 0236  WBC 8.6 8.8 8.8  RBC 3.81* 3.67* 3.47*  HGB 11.2* 10.6* 9.9*  HCT 34.8* 33.3* 31.1*  MCV 91.3 90.7 89.6  MCH 29.4 28.9 28.5  MCHC 32.2 31.8 31.8  RDW 16.4* 16.8* 16.5*  PLT 329 333 317   Thyroid No results for input(s): "TSH", "FREET4" in the last 168 hours.  BNP Recent Labs  Lab 05/14/23 2240  BNP 19.3    DDimer No results for input(s): "DDIMER" in the last 168 hours.   Radiology    No results found.  Cardiac Studies   RHC 05/12/23: 1. Elevated right > left heart filling pressures.  2. Mixed pulmonary venous/pulmonary arterial hypertension.  3. Low cardiac output and low PAPi, suspect primarily RV failure.    Suspect diastolic CHF, pulmonary hypertension due to elevated LA pressure as well as OHS/OSA. Significant RV failure.  Will  increase torsemide to 100 qam/50 qpm and add KCl 20 daily.  I worry that she will need HD soon based on renal dysfunction.   Patient Profile     47 y.o. female with a hx of HFpEF/RV failure, OSA, Super morbid obesity, uncontrolled DM, CKD IIIb, cocaine use, non-compliance and left leg BKA/right transmetatarsal who is being seen for CHF.  Assessment & Plan    Chronic diastolic heart failure RV failure Recent right heart cath with findings suggestive of primarily right heart failure. Torsemide increased to 100 / 50 mg daily with 5 mg metolazone three times per week.  - presented with chest pain and worsening SOB - started on IV lasix 80 mg BID but in the setting of AoCKD - has diuresed 1425 cc urine output for overall net negative 3L - weights not recorded - she continues to report SOB    Acute on chronic kidney failure sCr 2.36 (2.44) BUN 151 - suspect due to renal function Baseline sCr around 2.2 Nephrology on board   Uncontrolled DM A1c 13.1%, on SSI Per primary   Nonobstructive CAD On ASA, statin  Anemia Hb 9.9 No active signs of bleeding - suspect anemia of chronic disease related to renal function    OSA On CPAP - reports compliance   Acute on Chronic hypoxic respiratory failure 3-4 L [place] at home Recently increased to 4L at home, currently on    PAD Left BKA, right transmetatarsal ampuation Wheelchair bound   Morbid obesity Cocaine use Noncompliance UDS positive for cocaine this admission Counseled on cessation   Appreciate nephrology input. May ultimately require dialysis.      For questions or updates, please contact [facility] Please consult [email or web address] for contact info under        Signed, Marcelino Duster, PA  05/17/2023, 9:16 AM     Patient seen and examined.  Agree with above documentation.  On exam, patient is alert and oriented, regular rate and rhythm, no murmurs, diminished breath sounds, difficult to assess JVD  given body habitus, s/p L BKA.  Net -900 cc yesterday, continue IV Lasix.  Receiving dose of metolazone today as well.  Little Ishikawa, MD

## 2023-05-17 NOTE — Telephone Encounter (Signed)
Routing to Dr. Barb Merino who is taking care of patient in the hospital right now, to see if physical therapy can be consulted inpatient and if they can comment on her needing a lift chair.  Thanks,  Latrelle Dodrill, MD

## 2023-05-17 NOTE — Assessment & Plan Note (Signed)
P/w chest pain after RHC on 6/6. ACS workup unremarkable. Recent cocaine use. -Appreciate HF team recs -lasix 80mg  bid -cardiac monitoring

## 2023-05-17 NOTE — Assessment & Plan Note (Addendum)
Last echo 3/24 with EF 55-60%.  Follows with cardiology.  Had RHC on 6/6 showing pulmonary hypertension, RV failure.  Home meds include torsemide 100 mg in the morning and 50 mg in the evening, metolazone 5 mg 3 days/week.  Difficult to assess volume status due to body habitus but suspect abdominal fluid retain given  tightness on exam.  CXR shows mild vascular congestion. - overnight. -Heart Failure team consulted, appreciate recs -IV Lasix 80mg  BID -home metolazone -Strict I/os -Daily weights

## 2023-05-17 NOTE — Assessment & Plan Note (Addendum)
Poorly controlled DM with most recent A1c of 13.5 a month ago. Home meds include Tresiba 70 units twice daily and NovoLog 45u 3 times daily. -Semglee 50 units BID, adjust as needed -Novolog 13u TID WC -Resistant SSI with CBGs before every meal and nightly

## 2023-05-17 NOTE — Inpatient Diabetes Management (Signed)
Inpatient Diabetes Program Recommendations  AACE/ADA: New Consensus Statement on Inpatient Glycemic Control (2015)  Target Ranges:  Prepandial:   less than 140 mg/dL      Peak postprandial:   less than 180 mg/dL (1-2 hours)      Critically ill patients:  140 - 180 mg/dL   Lab Results  Component Value Date   GLUCAP 297 (H) 05/17/2023   HGBA1C 13.6 (H) 04/13/2023    Review of Glycemic Control  Latest Reference Range & Units 05/16/23 17:02 05/16/23 21:17 05/16/23 23:02 05/17/23 07:36  Glucose-Capillary 70 - 99 mg/dL 161 (H) 096 (HH) 045 (H) 297 (H)  (HH): Data is critically high (H): Data is abnormally high Diabetes history: DM  Outpatient Diabetes medications:  Dexcom sensor Novolog 45 units tid with meals Tresiba 70 units bid Current orders for Inpatient glycemic control:  Novolog 0-20 units tid with meals Semglee 40 units bid Novolog 10 units TID   Inpatient Diabetes Program Recommendations:     Consider: - adding CHO modified to diet if appropriate - Increasing Semglee 55 units BID -Increasing Novolog 20 units TID (hold if patient eats less than 50% or NPO).    Patient was seen by DM coordinator on 04/27/23 due to high A1C.  Needs close f/u with [facility] endocrinology.    Thanks, Lujean Rave, MSN, RNC-OB Diabetes Coordinator [phone] (8a-5p)

## 2023-05-17 NOTE — Progress Notes (Addendum)
[facility] NEPHROLOGY PROGRESS NOTE  Assessment/ Plan: Pt is a 47 y.o. female with PMHx HFpEF/RV failure, OSA, super morbid obesity, uncontrolled DM, CKD IIIb, recurrent cocaine use, and left leg BKA/right transmetatarsal consulted for worsening kidney function in setting of diuresis.   # Renal- Scr 2.44>2.36, BUN 153>151. Bicarb 35, Na 132, K 3.6>3.1 (repletion per primary). Currently on Lasix 80 mg BID, Metolazone 3xweekly M/W/F. Kidney function stable today, tolerating diuresis. UOP 1.4L overnight. Will add a dose of metolazone today in addition to Lasix 80 BID. Re-assess kidney function in AM. Recommend daily weights as it is very difficult to assess volume status with significant morbid obesity.  # Anemia- Iron 43, ferritin 111, IV iron added.  # HTN/volume- Hypervolemia, attempting continued diuresis with monitoring concern for uremia and continued kidney disease. Grossly normotensive this AM.  # HFpEf  PVH/PAH - per cardiology, diuresing as above # OSA  OHS - On Bipap at night, needs 3-4 L during the day.   # DM2 - per primary  #Hyperphosphatemia-- Phos 4.8, add binder in AM if continues to increase  #Metabolic alkalosis-- Bicarb 35, if continues to increase, may need to add acetazolamide dose   Recommendations  -IV iron x4 doses-ordered  -Add Metolazone dose 5 mg today in addition to M/W/F schedule (ordered) -Klor-Con 40 mEq x2 doses (ordered per primary) -No indication for dialysis right now.  -Recheck labs in AM-ordered   Subjective:  Patient continues to have leg pain, worried about fluid that has been causing difficulty breathing. Reports that she was taking her diuretics as instructed.   Objective Vital signs in last 24 hours: Vitals:   05/16/23 2008 05/16/23 2234 05/17/23 0414 05/17/23 1005  BP: 132/71  131/69   Pulse: 96 91 87   Resp: 18 (!) 24 17 18   Temp: 98 F (36.7 C)     TempSrc: Axillary     SpO2: 100% 99% 100%   Weight:      Height:        Weight change:   Intake/Output Summary (Last 24 hours) at 05/17/2023 1035 Last data filed at 05/17/2023 0900 Gross per 24 hour  Intake 890 ml  Output 825 ml  Net 65 ml       Labs: RENAL PANEL Recent Labs  Lab 05/14/23 2240 05/15/23 0516 05/15/23 2351 05/16/23 1422 05/16/23 2212 05/17/23 0236  NA 137 135 130*  --  130* 132*  K 3.2* 3.7 3.8  --  3.6 3.1*  CL 85* 88* 82*  --  82* 84*  CO2 34* 32 31  --  32 35*  GLUCOSE 243* 244* 484* 428* 480* 366*  BUN 143* 146* 157*  --  153* 151*  CREATININE 2.19* 2.55* 2.79*  --  2.44* 2.36*  CALCIUM 9.2 8.8* 8.8*  --  8.8* 9.0  MG  --   --  2.0  --   --  2.1  PHOS  --   --   --   --   --  4.8*    Liver Function Tests: No results for input(s): "AST", "ALT", "ALKPHOS", "BILITOT", "PROT", "ALBUMIN" in the last 168 hours. No results for input(s): "LIPASE", "AMYLASE" in the last 168 hours. No results for input(s): "AMMONIA" in the last 168 hours. CBC: Recent Labs    05/04/23 1212 05/12/23 0811 05/14/23 2240 05/15/23 2351 05/17/23 0236  HGB 11.6* 12.2  11.9* 11.2* 10.6* 9.9*  MCV 88.0  --  91.3 90.7 89.6  FERRITIN  --   --   --   --  111  TIBC  --   --   --   --  437  IRON  --   --   --   --  43    Cardiac Enzymes: No results for input(s): "CKTOTAL", "CKMB", "CKMBINDEX", "TROPONINI" in the last 168 hours. CBG: Recent Labs  Lab 05/16/23 1231 05/16/23 1702 05/16/23 2117 05/16/23 2302 05/17/23 0736  GLUCAP 424* 451* 508* 492* 297*    Iron Studies:  Recent Labs    05/17/23 0236  IRON 43  TIBC 437  FERRITIN 111   Studies/Results: No results found.  Medications: Infusions:  ferric gluconate (FERRLECIT) IVPB       Scheduled Medications:  acetaminophen  325 mg Oral Q4H   amLODipine  10 mg Oral Daily   aspirin EC  81 mg Oral Daily   brinzolamide  1 drop Both Eyes TID   And   brimonidine  1 drop Both Eyes TID   DULoxetine  120 mg Oral Daily   enoxaparin (LOVENOX) injection  80 mg Subcutaneous Q24H    furosemide  80 mg Intravenous BID   gabapentin  600 mg Oral QHS   insulin aspart  0-20 Units Subcutaneous TID WC   insulin aspart  10 Units Subcutaneous TID AC   insulin glargine-yfgn  40 Units Subcutaneous BID   latanoprost  1 drop Both Eyes Daily   metolazone  5 mg Oral Q M,W,F   mometasone-formoterol  2 puff Inhalation BID   Netarsudil Dimesylate  1 drop Left Eye Daily   pantoprazole  40 mg Oral Daily   potassium chloride  40 mEq Oral Q4H   prednisoLONE acetate  1 drop Both Eyes QID   rOPINIRole  1 mg Oral QHS   rosuvastatin  10 mg Oral Daily   I have reviewed scheduled and prn medications.  Physical Exam: General: Appears nontoxic, layong on left sided, morbidly obese  Heart:RRR, s1s2 nl Lungs: Clear to auscultation. Respiratory effort normal. Slight scattered wheeze  Abdomen: Obese, soft  Extremities: Slight pedal edema Dialysis Access: None    Artina Minella 05/17/2023,10:35 AM  LOS: 1 day

## 2023-05-17 NOTE — Evaluation (Signed)
Physical Therapy Evaluation Patient Details Name: [name] MRN: [id number] DOB: 1976/04/22 Today's Date: 05/17/2023  History of Present Illness  Pt is a 47 year old female admitted on 05/14/23 with chest pain. Past medical hx of HFpEF/RV failure, OSA, Super morbid obesity, uncontrolled DM, CKD IIIb, cocaine use, non-compliance and left leg BKA/right transmetatarsal.  Clinical Impression  Pt presents with admitting diagnosis above. PT consult placed for evaluation of pt for lift chair. MD made aware that lift chair evaluation is not something that PT does. Pt today was able to sit EOB Mod I however further mobility was limited because pt did not have prosthetic in room. Pt presents at or near baseline mobility. Pt has no further acute PT needs and will be signing off. Pt may benefit from OPPT for gait training with prosthetic. Re consult PT if mobility status changes.        Recommendations for follow up therapy are one component of a multi-disciplinary discharge planning process, led by the attending physician.  Recommendations may be updated based on patient status, additional functional criteria and insurance authorization.  Follow Up Recommendations       Assistance Recommended at Discharge Intermittent Supervision/Assistance  Patient can return home with the following  A little help with bathing/dressing/bathroom;Assistance with cooking/housework;Assist for transportation;Help with stairs or ramp for entrance;A lot of help with walking and/or transfers    Equipment Recommendations Other (comment) (Pt requests new lift chair)  Recommendations for Other Services       Functional Status Assessment Patient has not had a recent decline in their functional status     Precautions / Restrictions Precautions Precautions: Fall;Other (comment) Precaution Comments: L BKA, R transmet, watch SpO2 Required Braces or Orthoses: Other Brace Other Brace: L prothesis Restrictions Weight Bearing  Restrictions: No      Mobility  Bed Mobility Overal bed mobility: Modified Independent Bed Mobility: Rolling, Supine to Sit Rolling: Modified independent (Device/Increase time)   Supine to sit: Modified independent (Device/Increase time)          Transfers                   General transfer comment: Pt unable without prosthesis    Ambulation/Gait                  Stairs            Wheelchair Mobility    Modified Rankin (Stroke Patients Only)       Balance Overall balance assessment: Needs assistance Sitting-balance support: No upper extremity supported, Feet supported Sitting balance-Leahy Scale: Normal Sitting balance - Comments: Able to sit EOB                                     Pertinent Vitals/Pain Pain Assessment Pain Assessment: 0-10 Pain Score: 9  Pain Location: BLE Pain Descriptors / Indicators: Shooting, Aching, Discomfort, Grimacing Pain Intervention(s): Monitored during session, Patient requesting pain meds-RN notified    Home Living Family/patient expects to be discharged to:: Private residence Living Arrangements: Children Available Help at Discharge: Family;Personal care attendant;Available 24 hours/day;Friend(s) Type of Home: Other(Comment) ([employer]el) Home Access: Stairs to enter Entrance Stairs-Rails: None Entrance Stairs-Number of Steps: 1   Home Layout: One level Home Equipment: Wheelchair - power;Wheelchair - manual;BSC/3in1;Other (comment);Rolling Walker (2 wheels) Additional Comments: Power wheelchair no longer working (~August 2023). DABSC one arm won't stay up. Slide board    Prior  Function Prior Level of Function : Needs assist           ADLs (physical): Bathing;Dressing;Toileting Mobility Comments: Has left prothesis. needs assist with placing liner. ADLs Comments: Medicare transportation; she washes her top half in bed and aide washes bottom half; asisstance for prosthesis  donning/doffing     Hand Dominance   Dominant Hand: Right    Extremity/Trunk Assessment   Upper Extremity Assessment Upper Extremity Assessment: Overall WFL for tasks assessed    Lower Extremity Assessment RLE Deficits / Details: R transmet amputation LLE Deficits / Details: hx of L BKA    Cervical / Trunk Assessment Cervical / Trunk Assessment: Other exceptions Cervical / Trunk Exceptions: significant body habitus  Communication   Communication: No difficulties  Cognition Arousal/Alertness: Awake/alert Behavior During Therapy: WFL for tasks assessed/performed Overall Cognitive Status: Within Functional Limits for tasks assessed                                          General Comments General comments (skin integrity, edema, etc.): VSS on 4L    Exercises     Assessment/Plan    PT Assessment All further PT needs can be met in the next venue of care  PT Problem List Decreased strength;Decreased activity tolerance;Decreased balance;Decreased mobility;Impaired sensation       PT Treatment Interventions      PT Goals (Current goals can be found in the Care Plan section)  Acute Rehab PT Goals PT Goal Formulation: All assessment and education complete, DC therapy    Frequency       Co-evaluation               AM-PAC PT "6 Clicks" Mobility  Outcome Measure Help needed turning from your back to your side while in a flat bed without using bedrails?: None Help needed moving from lying on your back to sitting on the side of a flat bed without using bedrails?: None Help needed moving to and from a bed to a chair (including a wheelchair)?: A Lot Help needed standing up from a chair using your arms (e.g., wheelchair or bedside chair)?: A Lot Help needed to walk in hospital room?: Total Help needed climbing 3-5 steps with a railing? : Total 6 Click Score: 14    End of Session Equipment Utilized During Treatment: Oxygen Activity Tolerance:  Patient tolerated treatment well Patient left: in bed;with call bell/phone within reach Nurse Communication: Mobility status;Patient requests pain meds PT Visit Diagnosis: Muscle weakness (generalized) (M62.81)    Time: 1610-9604 PT Time Calculation (min) (ACUTE ONLY): 16 min   Charges:   PT Evaluation $PT Eval Moderate Complexity: 1 Mod          Alberto Pina B, PT, DPT Acute Rehab Services [phone]   Gladys Damme 05/17/2023, 9:55 AM

## 2023-05-17 NOTE — Progress Notes (Signed)
   05/16/23 2234  BiPAP/CPAP/SIPAP  $ Non-Invasive Home Ventilator  Subsequent  BiPAP/CPAP/SIPAP Pt Type Adult  BiPAP/CPAP/SIPAP Resmed  Mask Type Full face mask (pt home mask)  IPAP 18 cmH20  EPAP 13 cmH2O  Flow Rate 4 lpm (bled in)  BiPAP/CPAP /SiPAP Vitals  Pulse Rate 91  Resp (!) 24  SpO2 99 %

## 2023-05-18 DIAGNOSIS — R079 Chest pain, unspecified: Secondary | ICD-10-CM | POA: Diagnosis not present

## 2023-05-18 DIAGNOSIS — I50810 Right heart failure, unspecified: Secondary | ICD-10-CM | POA: Diagnosis not present

## 2023-05-18 DIAGNOSIS — R7989 Other specified abnormal findings of blood chemistry: Secondary | ICD-10-CM | POA: Diagnosis not present

## 2023-05-18 DIAGNOSIS — I5033 Acute on chronic diastolic (congestive) heart failure: Secondary | ICD-10-CM | POA: Diagnosis not present

## 2023-05-18 LAB — CBC
HCT: 32 % — ABNORMAL LOW (ref 36.0–46.0)
Hemoglobin: 10.3 g/dL — ABNORMAL LOW (ref 12.0–15.0)
MCH: 28.8 pg (ref 26.0–34.0)
MCHC: 32.2 g/dL (ref 30.0–36.0)
MCV: 89.4 fL (ref 80.0–100.0)
Platelets: 333 10*3/uL (ref 150–400)
RBC: 3.58 MIL/uL — ABNORMAL LOW (ref 3.87–5.11)
RDW: 16.4 % — ABNORMAL HIGH (ref 11.5–15.5)
WBC: 9.4 10*3/uL (ref 4.0–10.5)
nRBC: 0.2 % (ref 0.0–0.2)

## 2023-05-18 LAB — RENAL FUNCTION PANEL
Albumin: 2.8 g/dL — ABNORMAL LOW (ref 3.5–5.0)
Anion gap: 15 (ref 5–15)
BUN: 146 mg/dL — ABNORMAL HIGH (ref 6–20)
CO2: 36 mmol/L — ABNORMAL HIGH (ref 22–32)
Calcium: 9.2 mg/dL (ref 8.9–10.3)
Chloride: 83 mmol/L — ABNORMAL LOW (ref 98–111)
Creatinine, Ser: 1.77 mg/dL — ABNORMAL HIGH (ref 0.44–1.00)
GFR, Estimated: 36 mL/min — ABNORMAL LOW (ref >60)
Glucose, Bld: 232 mg/dL — ABNORMAL HIGH (ref 70–99)
Phosphorus: 4.1 mg/dL (ref 2.5–4.6)
Potassium: 3.7 mmol/L (ref 3.5–5.1)
Sodium: 134 mmol/L — ABNORMAL LOW (ref 135–145)

## 2023-05-18 LAB — TROPONIN I (HIGH SENSITIVITY)
Troponin I (High Sensitivity): 40 ng/L — ABNORMAL HIGH (ref <18)
Troponin I (High Sensitivity): 43 ng/L — ABNORMAL HIGH (ref <18)

## 2023-05-18 LAB — GLUCOSE, CAPILLARY
Glucose-Capillary: 221 mg/dL — ABNORMAL HIGH (ref 70–99)
Glucose-Capillary: 203 mg/dL — ABNORMAL HIGH (ref 70–99)
Glucose-Capillary: 271 mg/dL — ABNORMAL HIGH (ref 70–99)
Glucose-Capillary: 285 mg/dL — ABNORMAL HIGH (ref 70–99)

## 2023-05-18 LAB — MAGNESIUM: Magnesium: 2.1 mg/dL (ref 1.7–2.4)

## 2023-05-18 MED ORDER — GABAPENTIN 300 MG PO CAPS
300.0000 mg | ORAL_CAPSULE | Freq: Every day | ORAL | Status: DC
  Administered 2023-05-18 – 2023-05-28 (×11): 300 mg via ORAL
  Filled 2023-05-18 (×11): qty 1

## 2023-05-18 MED ORDER — PROCHLORPERAZINE MALEATE 10 MG PO TABS
10.0000 mg | ORAL_TABLET | Freq: Once | ORAL | Status: DC
  Filled 2023-05-18: qty 1

## 2023-05-18 MED ORDER — INSULIN ASPART 100 UNIT/ML IJ SOLN
16.0000 [IU] | Freq: Three times a day (TID) | INTRAMUSCULAR | Status: DC
  Administered 2023-05-18 – 2023-05-19 (×3): 16 [IU] via SUBCUTANEOUS

## 2023-05-18 MED ORDER — FUROSEMIDE 10 MG/ML IJ SOLN
80.0000 mg | Freq: Three times a day (TID) | INTRAMUSCULAR | Status: DC
  Administered 2023-05-18 – 2023-05-21 (×10): 80 mg via INTRAVENOUS
  Filled 2023-05-18 (×10): qty 8

## 2023-05-18 MED ORDER — POTASSIUM CHLORIDE CRYS ER 20 MEQ PO TBCR
20.0000 meq | EXTENDED_RELEASE_TABLET | Freq: Two times a day (BID) | ORAL | Status: AC
  Administered 2023-05-18 (×2): 20 meq via ORAL
  Filled 2023-05-18 (×2): qty 1

## 2023-05-18 MED ORDER — ORAL CARE MOUTH RINSE: 15.0000 mL | OROMUCOSAL | Status: DC | PRN

## 2023-05-18 MED ORDER — PROCHLORPERAZINE EDISYLATE 10 MG/2ML IJ SOLN
INTRAMUSCULAR | Status: AC
  Administered 2023-05-18: 10 mg
  Filled 2023-05-18: qty 2

## 2023-05-18 MED ORDER — INSULIN GLARGINE-YFGN 100 UNIT/ML ~~LOC~~ SOLN
55.0000 [IU] | Freq: Two times a day (BID) | SUBCUTANEOUS | Status: DC
  Administered 2023-05-18 – 2023-05-19 (×2): 55 [IU] via SUBCUTANEOUS
  Filled 2023-05-18 (×3): qty 0.55

## 2023-05-18 MED ORDER — ATORVASTATIN CALCIUM 80 MG PO TABS
80.0000 mg | ORAL_TABLET | Freq: Every day | ORAL | Status: DC
  Administered 2023-05-19 – 2023-05-28 (×9): 80 mg via ORAL
  Filled 2023-05-18 (×9): qty 1

## 2023-05-18 MED ORDER — ACETAZOLAMIDE 250 MG PO TABS
250.0000 mg | ORAL_TABLET | Freq: Once | ORAL | Status: AC
  Administered 2023-05-18: 250 mg via ORAL
  Filled 2023-05-18: qty 1

## 2023-05-18 NOTE — Progress Notes (Addendum)
Rounding Note    Patient Name: [name] Date of Encounter: 05/18/2023  [facility] Cardiologist: Tonny Bollman, MD   Subjective   Continues to report intermittent chest pains not relieved by nitroglycerin.  Has shortness of breath and has not felt symptoms have improved. Inpatient Medications    Scheduled Meds:  acetaminophen  325 mg Oral Q4H   acetaZOLAMIDE  250 mg Oral Once   amLODipine  10 mg Oral Daily   aspirin EC  81 mg Oral Daily   brinzolamide  1 drop Both Eyes TID   And   brimonidine  1 drop Both Eyes TID   DULoxetine  120 mg Oral Daily   enoxaparin (LOVENOX) injection  80 mg Subcutaneous Q24H   furosemide  80 mg Intravenous TID   gabapentin  600 mg Oral QHS   insulin aspart  0-20 Units Subcutaneous TID WC   insulin aspart  13 Units Subcutaneous TID AC   insulin glargine-yfgn  50 Units Subcutaneous BID   latanoprost  1 drop Both Eyes Daily   metolazone  5 mg Oral Q M,W,F   mometasone-formoterol  2 puff Inhalation BID   pantoprazole  40 mg Oral Daily   potassium chloride  20 mEq Oral BID   prednisoLONE acetate  1 drop Both Eyes QID   prochlorperazine  10 mg Oral Once   rOPINIRole  1 mg Oral QHS   rosuvastatin  10 mg Oral Daily   Continuous Infusions:  ferric gluconate (FERRLECIT) IVPB 250 mg (05/17/23 1309)   PRN Meds: albuterol, diclofenac Sodium, ipratropium-albuterol, nitroGLYCERIN   Vital Signs    Vitals:   05/17/23 1800 05/17/23 1945 05/17/23 2359 05/18/23 0652  BP:  (!) 152/86 133/69 (!) 145/91  Pulse:  93 90 88  Resp: 15 14 16 16   Temp:  98 F (36.7 C) 97.8 F (36.6 C) 98.3 F (36.8 C)  TempSrc:  Oral Axillary Oral  SpO2:  98% 97% 97%  Weight:      Height:        Intake/Output Summary (Last 24 hours) at 05/18/2023 0905 Last data filed at 05/17/2023 2117 Gross per 24 hour  Intake 734.38 ml  Output 1250 ml  Net -515.62 ml      05/14/2023   10:25 PM 05/12/2023    6:00 AM 04/28/2023    5:39 AM  Last 3 Weights  Weight  (lbs) 330 lb 11 oz 330 lb 11 oz 330 lb 11 oz  Weight (kg) 150 kg 150 kg 150 kg      Telemetry    Normal sinus rhythm heart rate - Personally Reviewed  ECG    Normal sinus rhythm, heart rate 89.  No acute ST-T wave changes. - Personally Reviewed  Physical Exam  Patient weighs 330 pounds.  Fluid status hard to determine.  GEN: Morbidly obese Neck: Not able to assess due to excessive body habitus  cardiac: RRR, no murmurs, rubs, or gallops.  Respiratory: Clear to auscultation bilaterally. GI: Soft, nontender, non-distended  MS: left BKA, right transmetatarsal ampuation. +1 edema Neuro:  Nonfocal  Psych: Normal affect   Labs    High Sensitivity Troponin:   Recent Labs  Lab 05/15/23 2351 05/16/23 2044 05/16/23 2212 05/18/23 0458 05/18/23 0635  TROPONINIHS 39* 36* 38* 40* 43*     Chemistry Recent Labs  Lab 05/15/23 2351 05/16/23 1422 05/16/23 2212 05/17/23 0236 05/18/23 0119  NA 130*  --  130* 132* 134*  K 3.8  --  3.6 3.1* 3.7  CL 82*  --  82* 84* 83*  CO2 31  --  32 35* 36*  GLUCOSE 484*   < > 480* 366* 232*  BUN 157*  --  153* 151* 146*  CREATININE 2.79*  --  2.44* 2.36* 1.77*  CALCIUM 8.8*  --  8.8* 9.0 9.2  MG 2.0  --   --  2.1 2.1  ALBUMIN  --   --   --   --  2.8*  GFRNONAA 21*  --  24* 25* 36*  ANIONGAP 17*  --  16* 13 15   < > = values in this interval not displayed.    Lipids No results for input(s): "CHOL", "TRIG", "HDL", "LABVLDL", "LDLCALC", "CHOLHDL" in the last 168 hours.  Hematology Recent Labs  Lab 05/15/23 2351 05/17/23 0236 05/18/23 0119  WBC 8.8 8.8 9.4  RBC 3.67* 3.47* 3.58*  HGB 10.6* 9.9* 10.3*  HCT 33.3* 31.1* 32.0*  MCV 90.7 89.6 89.4  MCH 28.9 28.5 28.8  MCHC 31.8 31.8 32.2  RDW 16.8* 16.5* 16.4*  PLT 333 317 333   Thyroid No results for input(s): "TSH", "FREET4" in the last 168 hours.  BNP Recent Labs  Lab 05/14/23 2240  BNP 19.3    DDimer No results for input(s): "DDIMER" in the last 168 hours.   Radiology     No results found.  Cardiac Studies   RHC 05/12/23: 1. Elevated right > left heart filling pressures.  2. Mixed pulmonary venous/pulmonary arterial hypertension.  3. Low cardiac output and low PAPi, suspect primarily RV failure.    Suspect diastolic CHF, pulmonary hypertension due to elevated LA pressure as well as OHS/OSA. Significant RV failure.  Will increase torsemide to 100 qam/50 qpm and add KCl 20 daily.  I worry that she will need HD soon based on renal dysfunction.   Echocardiogram 02/22/2023  1. Left ventricular ejection fraction, by estimation, is 55 to 60%. The  left ventricle has normal function. Left ventricular endocardial border  not optimally defined to evaluate regional wall motion. Left ventricular  diastolic parameters are consistent  with Grade II diastolic dysfunction (pseudonormalization).   2. Right ventricular systolic function was not well visualized. The right  ventricular size is not well visualized. Tricuspid regurgitation signal is  inadequate for assessing PA pressure.   3. The mitral valve was not well visualized. No evidence of mitral valve  regurgitation.   4. The aortic valve was not well visualized. Aortic valve regurgitation  is not visualized. No aortic stenosis is present.   5. The inferior vena cava is dilated in size with <50% respiratory  variability, suggesting right atrial pressure of 15 mmHg.   6. Technically difficult study with very poor acoustic windows. The right  side of the heart was minimally visualized.   Patient Profile     47 y.o. female  hx of HFpEF/RV failure, OSA, Super morbid obesity, uncontrolled DM, CKD IIIb, cocaine use, non-compliance and left leg BKA/right transmetatarsal who is being seen for CHF.  Assessment & Plan    Acute on chronic HFpEF RV failure Right heart catheterization on 05/12/2023 showed primarily right-sided heart failure.  Echocardiogram showing LVEF 55 to 60% with grade 2 diastolic dysfunction.  Elevated  right atrial pressure of 15.  BNP 19.3 but given excessive body habitus not a great representation.  She has been receiving aggressive diuresis with nephrology's help.  Renal function now looks to be improved.  Creatinine 1.77, baseline difficult to determine.  Appears to be around  1.5 prior to admission however has been in the mid twos during this admission.  Still appears to be hypervolemic on exam.  Still has shortness of breath while laying in bed. Continue acetazolamide 200mg , IV Lasix Lasix 80 mg 3 times daily metolazone 5 mg Wednesday Friday, per nephro Not a candidate for SGLT2 inhibitor due to uncontrolled diabetes and significant concern for yeast infections. I's and O's not accurate.  But appears to be having sufficient output.  Nonobstructive CAD Atypical chest pain Had acute event last night of chest pain. Repeat EKG showing no acute ST-T wave changes.  Troponins mildly elevated but flat in the 40s.  Left heart catheterization in December 2021 showed mild diffuse disease throughout the LAD.  Continues to have intermittent complaints of chest pain that last for couple minutes.  Not likely to be related to ischemic disease. Continue aspirin and statin.  LDL 174 7 months ago. Rosuvastatin 10 mg. Will switch to atorvastatin 80mg  with anticipation of worsening renal function and aggressive secondary prevention. Recheck LDL outpatient.  Will obtain another EKG due to artifact in leads 2 and 3.   AKI on CKD III. Baseline creatinine appears to be in the mid twos.  Prior to admission was closer to 1.5.  Nephrology following.  Renal function has improved after aggressive diuresis.  Uncontrolled diabetes mellitus A1c 13.1%  Anemia OSA on CPAP Acute on chronic hypoxic respiratory failure on 3 to 4 L via nasal cannula at home. PAD left BKA, right transmetatarsal amputation.  Wheelchair-bound. Morbid obesity Cocaine use, positive during this admission on UDS Noncompliance   For questions or  updates, please contact [facility] Please consult [email or web address] for contact info under        Signed, Abagail Kitchens, PA-C  05/18/2023, 9:05 AM     Patient seen and examined.  Agree with above documentation.  On exam, patient is somnolent but arousable, regular rate and rhythm, distant heart sounds, diminished breath sounds, unable to assess JVD given body habitus, status post left BKA. Incomkplete I/Os.  Renal function improving with diuresis, creatinine 2.36 > 1.77.  Continue IV Lasix.  Little Ishikawa, MD

## 2023-05-18 NOTE — Progress Notes (Signed)
Daily Progress Note Intern Pager: [phone]  Patient name: [name] Medical record number: [id number] Date of birth: 24-Nov-1976 Age: 47 y.o. Gender: female  Primary Care Provider: Latrelle Dodrill, MD Consultants: nephrology, heart failure Code Status: full   Pt Overview and Major Events to Date:  6/9 admitted   Assessment and Plan:   [name] is a 47 y.o. female p/w chest pain in the setting of recent RHC, heart failure, significant kidney disease, diabetes, and OSA.  Active Hospital Problems   *Chest pain           P/w chest pain after RHC on 6/6. ACS workup           unremarkable. Recent cocaine use.           -Appreciate HF team recs, diuresis as noted           -cardiac monitoring           -ASA, statin    Type 2 diabetes mellitus with hyperglycemia ([name])           Poorly controlled DM with most recent A1c of 13.5           a month ago. Home meds include Tresiba 70 units           twice daily and NovoLog 45u 3 times daily. Blood           glucose stable in 300s overnight.           -Semglee 55 units BID, adjust as needed           -Novolog 16u TID WC           -Resistant SSI with CBGs before every meal and           nightly    Acute on chronic diastolic heart failure ([name])           Last echo 3/24 with EF 55-60%.  Follows with           cardiology.  Had RHC on 6/6 showing pulmonary           hypertension, RV failure.  Home meds include           torsemide 100 mg in the morning and 50 mg in the           evening, metolazone 5 mg 3 days/week.  Difficult           to assess volume status.  CXR shows mild vascular           congestion. - documented since admit.-Heart           Failure team and nephrology consulted, appreciate           recs           -diuresis per nephrology, starting diamox           -home metolazone           -Strict I/os           -Daily weights    Obstructive sleep apnea (adult) (pediatric)        Chronic            Morbidly obese patient on 3-4 L [place] at baseline.           Will continue oxygen            supplementation and BiPAP nightly           -Continue Oxygen  supplement           -BiPAP nightly    RVF (right ventricular failure) ([name])    Azotemia           Hospitalized 2x  last month, first for uremia with           unclear etiology and then for AKI on CKD 4 which           improved with IV fluids and decreasing torsemide           and metolazone.  Discharged with plans to           follow-up with nephrology outpatient. BUN remains           elevated.  Creatinine 1.77 today, seems to be           improving with diuresis.           -nephrology consulted, appreciate recs           -monitor BMP    Precordial chest pain   Resolved Hospital Problems No resolved problems to display.    FEN/GI: reg diet (refused carb modified diet) PPx: lovenox Dispo:pending management as above  Subjective:  Asleep, wearing bipap, shakes head "no" when asked if any concerns  Objective: Temp:  [97.8 F (36.6 C)-98.3 F (36.8 C)] 98.3 F (36.8 C) (06/12 0652) Pulse Rate:  [88-93] 88 (06/12 0652) Resp:  [12-17] 16 (06/12 0652) BP: (133-152)/(69-91) 145/91 (06/12 0652) SpO2:  [97 %-98 %] 97 % (06/12 3244) Physical Exam: General: NAD, asleep Cardiovascular: RRR Respiratory: difficult to appreciate, on bipap Abdomen: nontender Extremities: moves extremities  Laboratory: Most recent CBC Lab Results  Component Value Date   WBC 9.4 05/18/2023   HGB 10.3 (L) 05/18/2023   HCT 32.0 (L) 05/18/2023   MCV 89.4 05/18/2023   PLT 333 05/18/2023   Most recent BMP    Latest Ref Rng & Units 05/18/2023    1:19 AM  BMP  Glucose 70 - 99 mg/dL 010   BUN 6 - 20 mg/dL 272   Creatinine 5.36 - 1.00 mg/dL 6.44   Sodium 034 - 742 mmol/L 134   Potassium 3.5 - 5.1 mmol/L 3.7   Chloride 98 - 111 mmol/L 83   CO2 22 - 32 mmol/L 36   Calcium 8.9 - 10.3 mg/dL 9.2     Trop overnight 40->43 Repeat EKG no ST  changes  Vonna Drafts, MD 05/18/2023, 11:33 AM  PGY-1, [facility] FPTS Intern pager: [phone], text pages welcome Secure chat group [facility] Teaching Service

## 2023-05-18 NOTE — Assessment & Plan Note (Addendum)
Poorly controlled DM with most recent A1c of 13.5 a month ago. Home meds include Tresiba 70 units twice daily and NovoLog 45u 3 times daily. Blood glucose stable in 300s overnight. -Semglee 55 units BID, adjust as needed -Novolog 16u TID WC -Resistant SSI with CBGs before every meal and nightly

## 2023-05-18 NOTE — Assessment & Plan Note (Signed)
Morbidly obese patient on 3-4 L [place] at baseline. Will continue oxygen supplementation and BiPAP nightly -Continue Oxygen supplement -BiPAP nightly

## 2023-05-18 NOTE — Assessment & Plan Note (Signed)
Hospitalized 2x  last month, first for uremia with unclear etiology and then for AKI on CKD 4 which improved with IV fluids and decreasing torsemide and metolazone.  Discharged with plans to follow-up with nephrology outpatient. BUN remains elevated.  Creatinine 1.77 today, seems to be improving with diuresis. -nephrology consulted, appreciate recs -monitor BMP

## 2023-05-18 NOTE — Assessment & Plan Note (Addendum)
P/w chest pain after RHC on 6/6. ACS workup unremarkable. Recent cocaine use. -Appreciate HF team recs, diuresis as noted -cardiac monitoring -ASA, statin

## 2023-05-18 NOTE — Plan of Care (Signed)

## 2023-05-18 NOTE — Inpatient Diabetes Management (Signed)
Inpatient Diabetes Program Recommendations  AACE/ADA: New Consensus Statement on Inpatient Glycemic Control (2015)  Target Ranges:  Prepandial:   less than 140 mg/dL      Peak postprandial:   less than 180 mg/dL (1-2 hours)      Critically ill patients:  140 - 180 mg/dL   Lab Results  Component Value Date   GLUCAP 332 (H) 05/17/2023   HGBA1C 13.6 (H) 04/13/2023    Review of Glycemic Control  Latest Reference Range & Units 05/16/23 17:02 05/16/23 21:17 05/16/23 23:02 05/17/23 07:36  Glucose-Capillary 70 - 99 mg/dL 161 (H) 096 (HH) 045 (H) 297 (H)    Latest Reference Range & Units 05/17/23 07:36 05/17/23 12:47 05/17/23 17:11 05/17/23 19:48  Glucose-Capillary 70 - 99 mg/dL 409 (H) 811 (H) 914 (H) 332 (H)    Diabetes history: DM  Outpatient Diabetes medications:  Dexcom sensor Novolog 45 units tid with meals Tresiba 70 units bid Current orders for Inpatient glycemic control:  Novolog 0-20 units tid with meals Semglee 50 units bid Novolog 13 units TID   Note: utilizing pts weight and home medications as a reference pt is very resistant to insulin. Will need larger increases in insulin inpatient.  -   Pt received 82 units of Novolog in the last 24 hours.  Inpatient Diabetes Program Recommendations:     Consider: - adding CHO modified to diet if appropriate - Increasing Semglee 60 units BID -Increasing Novolog 20 units TID (hold if patient eats less than 50% or NPO).    Patient was seen by DM coordinator on 04/27/23 due to high A1C.  Needs close f/u with [facility] endocrinology.    Thanks, Christena Deem RN, MSN, BC-ADM Inpatient Diabetes Coordinator Team Pager [phone] (8a-5p)

## 2023-05-18 NOTE — Progress Notes (Signed)
[facility] NEPHROLOGY PROGRESS NOTE  Assessment/ Plan: Pt is a 47 y.o. female with PMHx HFpEF/RV failure, OSA, super morbid obesity, uncontrolled DM, CKD IIIb, recurrent cocaine use, and left leg BKA/right transmetatarsal consulted for worsening kidney function in setting of diuresis.   # Renal- Scr 2.36>1.77, BUN 151>146. Bicarb 36, Na 134, K 3.7 (repletion 20 BID ordered). Currently on Lasix 80 mg TID (increasing as kidney function is tolerating), Metolazone 3xweekly M/W/F, received spot dosing of Metolazone 6/11. Tolerating diuresis. UOP 1.2L overnight. Re-assess kidney function in AM. Recommend daily weights as it is very difficult to assess volume status with significant morbid obesity. If tolerating still, will add spiro 25 mg tomorrow depending on BP and lytes.  # Anemia- IV iron on board x4 doses, Hgb 10.3 # HTN/volume- Hypervolemia, systolics largely<150s.  # HFpEf  PVH/PAH - per cardiology, diuresing as above # OSA  OHS - On Bipap at night, needs 3-4 L during the day.   # DM2 - per primary  #Hyperphosphatemia-- Phos 4.1, add binder as indicated #Metabolic alkalosis-- Bicarb 36, add acetazolamide dose     Subjective:  Patient denies any complaints today and is on Bipap.   Objective Vital signs in last 24 hours: Vitals:   05/17/23 1800 05/17/23 1945 05/17/23 2359 05/18/23 0652  BP:  (!) 152/86 133/69 (!) 145/91  Pulse:  93 90 88  Resp: 15 14 16 16   Temp:  98 F (36.7 C) 97.8 F (36.6 C) 98.3 F (36.8 C)  TempSrc:  Oral Axillary Oral  SpO2:  98% 97% 97%  Weight:      Height:       Weight change:   Intake/Output Summary (Last 24 hours) at 05/18/2023 0945 Last data filed at 05/17/2023 2117 Gross per 24 hour  Intake 734.38 ml  Output 1250 ml  Net -515.62 ml       Labs: RENAL PANEL Recent Labs  Lab 05/15/23 0516 05/15/23 2351 05/16/23 1422 05/16/23 2212 05/17/23 0236 05/18/23 0119  NA 135 130*  --  130* 132* 134*  K 3.7 3.8  --  3.6 3.1*  3.7  CL 88* 82*  --  82* 84* 83*  CO2 32 31  --  32 35* 36*  GLUCOSE 244* 484* 428* 480* 366* 232*  BUN 146* 157*  --  153* 151* 146*  CREATININE 2.55* 2.79*  --  2.44* 2.36* 1.77*  CALCIUM 8.8* 8.8*  --  8.8* 9.0 9.2  MG  --  2.0  --   --  2.1 2.1  PHOS  --   --   --   --  4.8* 4.1  ALBUMIN  --   --   --   --   --  2.8*    Liver Function Tests: Recent Labs  Lab 05/18/23 0119  ALBUMIN 2.8*   No results for input(s): "LIPASE", "AMYLASE" in the last 168 hours. No results for input(s): "AMMONIA" in the last 168 hours. CBC: Recent Labs    05/12/23 0811 05/14/23 2240 05/15/23 2351 05/17/23 0236 05/18/23 0119  HGB 12.2  11.9* 11.2* 10.6* 9.9* 10.3*  MCV  --  91.3 90.7 89.6 89.4  FERRITIN  --   --   --  111  --   TIBC  --   --   --  437  --   IRON  --   --   --  43  --     Cardiac Enzymes: No results for input(s): "CKTOTAL", "CKMB", "CKMBINDEX", "TROPONINI" in  the last 168 hours. CBG: Recent Labs  Lab 05/16/23 2302 05/17/23 0736 05/17/23 1247 05/17/23 1711 05/17/23 1948  GLUCAP 492* 297* 365* 354* 332*    Iron Studies:  Recent Labs    05/17/23 0236  IRON 43  TIBC 437  FERRITIN 111   Studies/Results: No results found.  Medications: Infusions:  ferric gluconate (FERRLECIT) IVPB 250 mg (05/17/23 1309)     Scheduled Medications:  acetaminophen  325 mg Oral Q4H   acetaZOLAMIDE  250 mg Oral Once   amLODipine  10 mg Oral Daily   aspirin EC  81 mg Oral Daily   brinzolamide  1 drop Both Eyes TID   And   brimonidine  1 drop Both Eyes TID   DULoxetine  120 mg Oral Daily   enoxaparin (LOVENOX) injection  80 mg Subcutaneous Q24H   furosemide  80 mg Intravenous TID   gabapentin  600 mg Oral QHS   insulin aspart  0-20 Units Subcutaneous TID WC   insulin aspart  13 Units Subcutaneous TID AC   insulin glargine-yfgn  50 Units Subcutaneous BID   latanoprost  1 drop Both Eyes Daily   metolazone  5 mg Oral Q M,W,F   mometasone-formoterol  2 puff Inhalation BID    pantoprazole  40 mg Oral Daily   potassium chloride  20 mEq Oral BID   prednisoLONE acetate  1 drop Both Eyes QID   prochlorperazine  10 mg Oral Once   rOPINIRole  1 mg Oral QHS   rosuvastatin  10 mg Oral Daily   I have reviewed scheduled and prn medications.  Physical Exam: General: Appears nontoxic, laying on left side, morbidly obese, bipap in place Heart:RRR, s1s2 nl Lungs: Clear to auscultation. Respiratory effort normal. Slight scattered wheeze  Abdomen: Obese, soft  Extremities: Slight pedal edema Dialysis Access: None    [name] 05/18/2023,9:45 AM  LOS: 2 days

## 2023-05-18 NOTE — Assessment & Plan Note (Addendum)
Last echo 3/24 with EF 55-60%.  Follows with cardiology.  Had RHC on 6/6 showing pulmonary hypertension, RV failure.  Home meds include torsemide 100 mg in the morning and 50 mg in the evening, metolazone 5 mg 3 days/week.  Difficult to assess volume status.  CXR shows mild vascular congestion. - documented since admit. -Heart Failure team and nephrology consulted, appreciate recs -diuresis per nephrology, starting diamox -home metolazone -Strict I/os -Daily weights

## 2023-05-18 NOTE — Progress Notes (Signed)
FMTS Interim Progress Note  S:Paged by RN for patient have chest tightness and pressure. Patient states this woke her up. No radiating. Feels her breathing is the same. Patient does not feel Nitroglycerin has helped with her pain.  O: BP 133/69 (BP Location: Right Arm)   Pulse 90   Temp 97.8 F (36.6 C) (Axillary)   Resp 16   Ht 5\' 2"  (1.575 m)   Wt (!) 150 kg   SpO2 97%   BMI 60.48 kg/m   General: NAD, sitting on edge of hospital bed Neuro: A&O Cardiovascular: RRR, no murmurs, no peripheral edema, 2+ radial pulse Respiratory: normal WOB on 4L, CTAB, no wheezes, ronchi or rales Extremities: Moving all 4 extremities equally, warm, well perfused  EKG - Unchanged from previous.  A/P: Chest Pain Consistent with patient's chronic chest pain due to cocaine use. Will rule out ACS. VSS, patient high risk; however, reassuringly has not had significant clinical change. Repeat EKG does not show new ST changes. -Trend Troponin -Nitro prn -Cardiology consulted, recs appreciated   Celine Mans, MD 05/18/2023, 5:15 AM PGY-1, [facility] Family Medicine Service pager [phone]

## 2023-05-19 ENCOUNTER — Telehealth: Payer: Self-pay | Admitting: Cardiology

## 2023-05-19 ENCOUNTER — Encounter (HOSPITAL_COMMUNITY): Payer: Self-pay | Admitting: Family Medicine

## 2023-05-19 DIAGNOSIS — I50810 Right heart failure, unspecified: Secondary | ICD-10-CM | POA: Diagnosis not present

## 2023-05-19 DIAGNOSIS — I5033 Acute on chronic diastolic (congestive) heart failure: Secondary | ICD-10-CM

## 2023-05-19 DIAGNOSIS — R7989 Other specified abnormal findings of blood chemistry: Secondary | ICD-10-CM | POA: Diagnosis not present

## 2023-05-19 LAB — CBC
HCT: 33 % — ABNORMAL LOW (ref 36.0–46.0)
Hemoglobin: 10.6 g/dL — ABNORMAL LOW (ref 12.0–15.0)
MCH: 29.2 pg (ref 26.0–34.0)
MCHC: 32.1 g/dL (ref 30.0–36.0)
MCV: 90.9 fL (ref 80.0–100.0)
Platelets: 344 10*3/uL (ref 150–400)
RBC: 3.63 MIL/uL — ABNORMAL LOW (ref 3.87–5.11)
RDW: 16.8 % — ABNORMAL HIGH (ref 11.5–15.5)
WBC: 8.7 10*3/uL (ref 4.0–10.5)
nRBC: 0 % (ref 0.0–0.2)

## 2023-05-19 LAB — RENAL FUNCTION PANEL
Albumin: 2.9 g/dL — ABNORMAL LOW (ref 3.5–5.0)
Anion gap: 19 — ABNORMAL HIGH (ref 5–15)
BUN: 135 mg/dL — ABNORMAL HIGH (ref 6–20)
CO2: 36 mmol/L — ABNORMAL HIGH (ref 22–32)
Calcium: 9.7 mg/dL (ref 8.9–10.3)
Chloride: 83 mmol/L — ABNORMAL LOW (ref 98–111)
Creatinine, Ser: 1.87 mg/dL — ABNORMAL HIGH (ref 0.44–1.00)
GFR, Estimated: 33 mL/min — ABNORMAL LOW (ref >60)
Glucose, Bld: 269 mg/dL — ABNORMAL HIGH (ref 70–99)
Phosphorus: 5.1 mg/dL — ABNORMAL HIGH (ref 2.5–4.6)
Potassium: 3.2 mmol/L — ABNORMAL LOW (ref 3.5–5.1)
Sodium: 138 mmol/L (ref 135–145)

## 2023-05-19 LAB — GLUCOSE, CAPILLARY
Glucose-Capillary: 301 mg/dL — ABNORMAL HIGH (ref 70–99)
Glucose-Capillary: 351 mg/dL — ABNORMAL HIGH (ref 70–99)
Glucose-Capillary: 232 mg/dL — ABNORMAL HIGH (ref 70–99)
Glucose-Capillary: 249 mg/dL — ABNORMAL HIGH (ref 70–99)

## 2023-05-19 LAB — MAGNESIUM: Magnesium: 2 mg/dL (ref 1.7–2.4)

## 2023-05-19 MED ORDER — ACETAZOLAMIDE 250 MG PO TABS
250.0000 mg | ORAL_TABLET | Freq: Once | ORAL | Status: AC
  Administered 2023-05-19: 250 mg via ORAL
  Filled 2023-05-19: qty 1

## 2023-05-19 MED ORDER — POTASSIUM CHLORIDE 10 MEQ/100ML IV SOLN: 10.0000 meq | INTRAVENOUS | Status: DC

## 2023-05-19 MED ORDER — SODIUM CHLORIDE 0.9 % IV SOLN
6.2500 mg | Freq: Once | INTRAVENOUS | Status: AC
  Administered 2023-05-19: 6.25 mg via INTRAVENOUS
  Filled 2023-05-19 (×2): qty 0.25

## 2023-05-19 MED ORDER — POTASSIUM CHLORIDE CRYS ER 20 MEQ PO TBCR
40.0000 meq | EXTENDED_RELEASE_TABLET | Freq: Two times a day (BID) | ORAL | Status: AC
  Administered 2023-05-19 (×2): 40 meq via ORAL
  Filled 2023-05-19 (×2): qty 2

## 2023-05-19 MED ORDER — INSULIN ASPART 100 UNIT/ML IJ SOLN
20.0000 [IU] | Freq: Three times a day (TID) | INTRAMUSCULAR | Status: DC
  Administered 2023-05-19 – 2023-05-20 (×3): 20 [IU] via SUBCUTANEOUS

## 2023-05-19 MED ORDER — SPIRONOLACTONE 25 MG PO TABS
25.0000 mg | ORAL_TABLET | Freq: Every day | ORAL | Status: DC
  Administered 2023-05-19 – 2023-05-23 (×5): 25 mg via ORAL
  Filled 2023-05-19 (×5): qty 1

## 2023-05-19 MED ORDER — PROMETHAZINE (PHENERGAN) 6.25MG IN NS 50ML IVPB
6.2500 mg | Freq: Once | INTRAVENOUS | Status: DC
  Filled 2023-05-19: qty 50

## 2023-05-19 MED ORDER — INSULIN GLARGINE-YFGN 100 UNIT/ML ~~LOC~~ SOLN
60.0000 [IU] | Freq: Two times a day (BID) | SUBCUTANEOUS | Status: DC
  Administered 2023-05-19: 60 [IU] via SUBCUTANEOUS
  Filled 2023-05-19 (×3): qty 0.6

## 2023-05-19 NOTE — Assessment & Plan Note (Addendum)
Hospitalized 2x  last month, first for uremia with unclear etiology and then for AKI on CKD 4 which improved with IV fluids and decreasing torsemide and metolazone.  Discharged with plans to follow-up with nephrology outpatient. BUN remains elevated.  Creatinine seems to be improving with diuresis. -nephrology consulted, appreciate recs -monitor renal function

## 2023-05-19 NOTE — Progress Notes (Addendum)
Rounding Note    Patient Name: [name] Date of Encounter: 05/19/2023  [facility] Cardiologist: Tonny Bollman, MD   Subjective   Appears to be more lively today with better energy.  Symptoms of shortness of breath have finally started to improve.  Feels that her legs are not as tight. Inpatient Medications    Scheduled Meds:  acetaminophen  325 mg Oral Q4H   amLODipine  10 mg Oral Daily   aspirin EC  81 mg Oral Daily   atorvastatin  80 mg Oral Daily   brinzolamide  1 drop Both Eyes TID   And   brimonidine  1 drop Both Eyes TID   enoxaparin (LOVENOX) injection  80 mg Subcutaneous Q24H   furosemide  80 mg Intravenous TID   gabapentin  300 mg Oral QHS   insulin aspart  0-20 Units Subcutaneous TID WC   insulin aspart  16 Units Subcutaneous TID AC   insulin glargine-yfgn  55 Units Subcutaneous BID   latanoprost  1 drop Both Eyes Daily   metolazone  5 mg Oral Q M,W,F   mometasone-formoterol  2 puff Inhalation BID   pantoprazole  40 mg Oral Daily   potassium chloride  40 mEq Oral BID   prednisoLONE acetate  1 drop Both Eyes QID   prochlorperazine  10 mg Oral Once   rOPINIRole  1 mg Oral QHS   Continuous Infusions:  ferric gluconate (FERRLECIT) IVPB Stopped (05/18/23 1532)   PRN Meds: albuterol, diclofenac Sodium, ipratropium-albuterol, nitroGLYCERIN, mouth rinse   Vital Signs    Vitals:   05/19/23 0200 05/19/23 0400 05/19/23 0545 05/19/23 0600  BP:   (!) 111/50   Pulse: 88 92 86 87  Resp: 16 17 14 16   Temp:   (!) 97.4 F (36.3 C)   TempSrc:   Axillary   SpO2: 97% 95% 97% 96%  Weight:   (!) 154.3 kg   Height:        Intake/Output Summary (Last 24 hours) at 05/19/2023 0756 Last data filed at 05/19/2023 0328 Gross per 24 hour  Intake 1823.94 ml  Output 2700 ml  Net -876.06 ml       05/19/2023    5:45 AM 05/18/2023    3:00 PM 05/14/2023   10:25 PM  Last 3 Weights  Weight (lbs) 340 lb 2.7 oz 352 lb 1.2 oz 330 lb 11 oz  Weight (kg) 154.3 kg  159.7 kg 150 kg      Telemetry    Normal sinus rhythm heart rate 80s. - Personally Reviewed  ECG    No new tracings- Personally Reviewed  Physical Exam  Patient weighs 330 pounds.  Fluid status hard to determine.  GEN: Morbidly obese Neck: Not able to assess due to excessive body habitus  cardiac: RRR, no murmurs, rubs, or gallops.  Respiratory: Clear to auscultation bilaterally. GI: Soft, nontender, non-distended  MS: left BKA, right transmetatarsal ampuation.  Neuro:  Nonfocal  Psych: Normal affect   Labs    High Sensitivity Troponin:   Recent Labs  Lab 05/15/23 2351 05/16/23 2044 05/16/23 2212 05/18/23 0458 05/18/23 0635  TROPONINIHS 39* 36* 38* 40* 43*      Chemistry Recent Labs  Lab 05/17/23 0236 05/18/23 0119 05/19/23 0130  NA 132* 134* 138  K 3.1* 3.7 3.2*  CL 84* 83* 83*  CO2 35* 36* 36*  GLUCOSE 366* 232* 269*  BUN 151* 146* 135*  CREATININE 2.36* 1.77* 1.87*  CALCIUM 9.0 9.2 9.7  MG  2.1 2.1 2.0  ALBUMIN  --  2.8* 2.9*  GFRNONAA 25* 36* 33*  ANIONGAP 13 15 19*     Lipids No results for input(s): "CHOL", "TRIG", "HDL", "LABVLDL", "LDLCALC", "CHOLHDL" in the last 168 hours.  Hematology Recent Labs  Lab 05/17/23 0236 05/18/23 0119 05/19/23 0130  WBC 8.8 9.4 8.7  RBC 3.47* 3.58* 3.63*  HGB 9.9* 10.3* 10.6*  HCT 31.1* 32.0* 33.0*  MCV 89.6 89.4 90.9  MCH 28.5 28.8 29.2  MCHC 31.8 32.2 32.1  RDW 16.5* 16.4* 16.8*  PLT 317 333 344    Thyroid No results for input(s): "TSH", "FREET4" in the last 168 hours.  BNP Recent Labs  Lab 05/14/23 2240  BNP 19.3     DDimer No results for input(s): "DDIMER" in the last 168 hours.   Radiology    No results found.  Cardiac Studies   RHC 05/12/23: 1. Elevated right > left heart filling pressures.  2. Mixed pulmonary venous/pulmonary arterial hypertension.  3. Low cardiac output and low PAPi, suspect primarily RV failure.    Suspect diastolic CHF, pulmonary hypertension due to elevated LA  pressure as well as OHS/OSA. Significant RV failure.  Will increase torsemide to 100 qam/50 qpm and add KCl 20 daily.  I worry that she will need HD soon based on renal dysfunction.   Echocardiogram 02/22/2023  1. Left ventricular ejection fraction, by estimation, is 55 to 60%. The  left ventricle has normal function. Left ventricular endocardial border  not optimally defined to evaluate regional wall motion. Left ventricular  diastolic parameters are consistent  with Grade II diastolic dysfunction (pseudonormalization).   2. Right ventricular systolic function was not well visualized. The right  ventricular size is not well visualized. Tricuspid regurgitation signal is  inadequate for assessing PA pressure.   3. The mitral valve was not well visualized. No evidence of mitral valve  regurgitation.   4. The aortic valve was not well visualized. Aortic valve regurgitation  is not visualized. No aortic stenosis is present.   5. The inferior vena cava is dilated in size with <50% respiratory  variability, suggesting right atrial pressure of 15 mmHg.   6. Technically difficult study with very poor acoustic windows. The right  side of the heart was minimally visualized.   Patient Profile     47 y.o. female  hx of HFpEF/RV failure, OSA, Super morbid obesity, uncontrolled DM, CKD IIIb, cocaine use, non-compliance and left leg BKA/right transmetatarsal who is being seen for CHF.  Assessment & Plan    Acute on chronic HFpEF RV failure Right heart catheterization on 05/12/2023 showed primarily right-sided heart failure.  Echocardiogram showing LVEF 55 to 60% with grade 2 diastolic dysfunction.  Elevated right atrial pressure of 15.  BNP 19.3 but given excessive body habitus not a great representation.  She has been receiving aggressive diuresis with nephrology's help.  Renal function has improved on IV diuretics.  Creatinine 1.87, baseline difficult to determine.  Appears to be around 1.5 prior to  admission however has been in the mid twos during this admission.   Difficult to assess volume status on exam but given RHC numbers last week suspect has more volume.  Throughout this admission has struggled with hypokalemia, currently 3.2.  Diuresed 2.7 L yesterday. Continue acetazolamide 200mg , IV Lasix Lasix 80 mg 3 times daily metolazone 5 mg Monday Wednesday Friday, per nephro Not a candidate for SGLT2 inhibitor due to uncontrolled diabetes and significant concern for yeast infections. Given recurrent hypokalemia,  consider spironolactone for potassium and GDMT titration.   Nonobstructive CAD Atypical chest pain EKG showing no acute ST-T wave changes.  Troponins mildly elevated but flat in the 40s.  Left heart catheterization in December 2021 showed mild diffuse disease throughout the LAD.  Continues to have intermittent complaints of chest pain that last for couple minutes.  Not likely to be related to ischemic disease. Continue aspirin and statin.  LDL 174 7 months ago. PTA Rosuvastatin 10 mg was switched to atorvastatin 80mg  with anticipation of worsening renal function and aggressive secondary prevention. Recheck LDL outpatient.   AKI on CKD III. Baseline creatinine appears to be in the mid twos.  Prior to admission was closer to 1.5.  Nephrology following.  Renal function has improved after aggressive diuresis.  Uncontrolled diabetes mellitus A1c 13.1%  Anemia OSA on CPAP Acute on chronic hypoxic respiratory failure on 3 to 4 L via nasal cannula at home. PAD left BKA, right transmetatarsal amputation.  Wheelchair-bound. Morbid obesity Cocaine use, positive during this admission on UDS Noncompliance  Has TOC HF f/u arranged, will also arrange gen cards f/u  For questions or updates, please contact [facility] Please consult [email or web address] for contact info under        Signed, Abagail Kitchens, PA-C  05/19/2023, 7:56 AM      Patient seen and examined.  Agree with  above documentation.  On exam, patient is alert and oriented, regular rate and rhythm, no murmurs, diminished breath sounds, s/p LBKA, difficult to assess JVD given habitus.  Remains volume oeverloaded, continue IV lasix.  Little Ishikawa, MD

## 2023-05-19 NOTE — Assessment & Plan Note (Signed)
Last echo 3/24 with EF 55-60%.  Follows with cardiology.  Had RHC on 6/6 showing pulmonary hypertension, RV failure.  Home meds include torsemide 100 mg in the morning and 50 mg in the evening, metolazone 5 mg 3 days/week.  Difficult to assess volume status.  CXR shows mild vascular congestion. - documented since admit. -Heart Failure team and nephrology consulted, appreciate recs -diuresis per nephrology, starting diamox -home metolazone -Strict I/os -Daily weights

## 2023-05-19 NOTE — Plan of Care (Signed)

## 2023-05-19 NOTE — Progress Notes (Signed)
   05/18/23 2338  BiPAP/CPAP/SIPAP  BiPAP/CPAP/SIPAP Resmed  Reason BIPAP/CPAP not in use Other(comment) (PT Said she is about to eat, will PUT CPAP on when ready)

## 2023-05-19 NOTE — Progress Notes (Signed)
Daily Progress Note Intern Pager: [phone]  Patient name: [name] Medical record number: [id number] Date of birth: 1976-01-20 Age: 47 y.o. Gender: female  Primary Care Provider: Latrelle Dodrill, MD Consultants: nephrology, heart failure Code Status: full   Pt Overview and Major Events to Date:  6/9 admitted   Assessment and Plan:   [name] is a 47 y.o. female p/w chest pain in the setting of recent RHC, heart failure, significant kidney disease, diabetes, and OSA.  Hospital Problem List      Hospital     * (Principal) Chest pain     P/w precordial chest pain after RHC on 6/6. ACS workup unremarkable.  Recent cocaine use. -Appreciate HF team recs, diuresis as noted -cardiac monitoring -ASA, statin        Obstructive sleep apnea (adult) (pediatric)     Morbidly obese patient on 3-4 L [place] at baseline. Will continue oxygen  supplementation and BiPAP nightly -Continue Oxygen supplement -BiPAP nightly        Type 2 diabetes mellitus with hyperglycemia ([name])     Poorly controlled DM with most recent A1c of 13.5 a month ago. Home  meds include Tresiba 70 units twice daily and NovoLog 45u 3 times daily.  Blood glucose stable in 200-300s overnight. -Semglee 60 units BID, adjust as needed -Novolog 20u TID WC -Resistant SSI with CBGs before every meal and nightly        Azotemia     Hospitalized 2x  last month, first for uremia with unclear etiology and  then for AKI on CKD 4 which improved with IV fluids and decreasing  torsemide and metolazone.  Discharged with plans to follow-up with  nephrology outpatient. BUN remains elevated.  Creatinine seems to be  improving with diuresis. -nephrology consulted, appreciate recs -monitor renal function        Acute on chronic diastolic heart failure ([name])     Last echo 3/24 with EF 55-60%.  Follows with cardiology.  Had RHC on  6/6 showing pulmonary hypertension, RV failure.  Home meds include  torsemide 100 mg  in the morning and 50 mg in the evening, metolazone 5 mg  3 days/week.  Difficult to assess volume status.  CXR shows mild vascular  congestion. - documented since admit. -Heart Failure team and nephrology consulted, appreciate recs -diuresis per nephrology, starting diamox -home metolazone -Strict I/os -Daily weights        Precordial chest pain     RVF (right ventricular failure) ([name])    FEN/GI: reg diet (refused carb modified diet) PPx: lovenox Dispo:pending management as above  Subjective:  NAEON, awake and sitting on edge of bed this morning.  States she is starting to feel little bit better.  Denies current concerns  Objective: Temp:  [97.4 F (36.3 C)-98.7 F (37.1 C)] 97.6 F (36.4 C) (06/13 1243) Pulse Rate:  [86-96] 96 (06/13 1243) Resp:  [14-20] 18 (06/13 1243) BP: (111-162)/(50-90) 150/90 (06/13 1243) SpO2:  [95 %-100 %] 100 % (06/13 1243) Weight:  [154.3 kg-159.7 kg] 154.3 kg (06/13 0545) Physical Exam: General: NAD Cardiovascular: RRR Respiratory: Difficult to appreciate, on supplemental O2 Abdomen: Soft NT ND  Laboratory: Most recent CBC Lab Results  Component Value Date   WBC 8.7 05/19/2023   HGB 10.6 (L) 05/19/2023   HCT 33.0 (L) 05/19/2023   MCV 90.9 05/19/2023   PLT 344 05/19/2023   Most recent BMP    Latest Ref Rng & Units 05/19/2023  1:30 AM  BMP  Glucose 70 - 99 mg/dL 161   BUN 6 - 20 mg/dL 096   Creatinine 0.45 - 1.00 mg/dL 4.09   Sodium 811 - 914 mmol/L 138   Potassium 3.5 - 5.1 mmol/L 3.2   Chloride 98 - 111 mmol/L 83   CO2 22 - 32 mmol/L 36   Calcium 8.9 - 10.3 mg/dL 9.7      Vonna Drafts, MD 05/19/2023, 1:23 PM  PGY-1, [facility] FPTS Intern pager: [phone], text pages welcome Secure chat group [facility] Teaching Service

## 2023-05-19 NOTE — Progress Notes (Signed)
   05/19/23 2357  BiPAP/CPAP/SIPAP  BiPAP/CPAP/SIPAP Pt Type Adult  BiPAP/CPAP/SIPAP Resmed  Mask Type Full face mask  Mask Size Small  IPAP 14 cmH20  EPAP 6 cmH2O  Patient Home Equipment No (has home mask)  Auto Titrate No

## 2023-05-19 NOTE — Telephone Encounter (Signed)
Patient still currently being admitted and for heart failure exacerbation.  Needs follow-up appointment after seeing [facility].  No available appointments within a reasonable timeframe.  I have scheduled her with Dr. Excell Seltzer in June 30, 2023 however needs closer follow-up.  Please help organize.

## 2023-05-19 NOTE — Progress Notes (Signed)
[facility] NEPHROLOGY PROGRESS NOTE  Assessment/ Plan: Pt is a 47 y.o. female with PMHx HFpEF/RV failure, OSA, super morbid obesity, uncontrolled DM, CKD IIIb, recurrent cocaine use, and left leg BKA/right transmetatarsal consulted for worsening kidney function in setting of diuresis.   # Renal- Scr 1.77>1.87, BUN 146>135. Bicarb 36, K 3.2 (repletion 40 BID x2 doses). Currently on Lasix 80 mg TID (increasing as kidney function is tolerating), Metolazone 3xweekly M/W/F, received spot dosing of Metolazone 6/11. Tolerating diuresis. UOP 2.7L overnight. Re-assess kidney function in AM. Recommend daily weights as it is very difficult to assess volume status with significant morbid obesity. Will add spiro 25 mg.  # Anemia- IV iron x4 doses, Hgb 10.6 # HTN/volume- Hypervolemia, systolics largely<150s.  # HFpEf  PVH/PAH - per cardiology, diuresing as above # OSA  OHS - On Bipap at night, needs 3-4 L during the day.   # DM2 - per primary  #Hyperphosphatemia-- Phos 5.1, add binder as indicated #Metabolic alkalosis-- Bicarb 36, add acetazolamide dose again today     Subjective:  Patient denies any complaints today and is sitting upright enjoying her food.   Objective Vital signs in last 24 hours: Vitals:   05/19/23 0400 05/19/23 0545 05/19/23 0600 05/19/23 0812  BP:  (!) 111/50  (!) 162/87  Pulse: 92 86 87 95  Resp: 17 14 16 17   Temp:  (!) 97.4 F (36.3 C)  98.7 F (37.1 C)  TempSrc:  Axillary  Axillary  SpO2: 95% 97% 96% 97%  Weight:  (!) 154.3 kg    Height:       Weight change:   Intake/Output Summary (Last 24 hours) at 05/19/2023 1003 Last data filed at 05/19/2023 0328 Gross per 24 hour  Intake 1823.94 ml  Output 2700 ml  Net -876.06 ml       Labs: RENAL PANEL Recent Labs  Lab 05/15/23 2351 05/16/23 1422 05/16/23 2212 05/17/23 0236 05/18/23 0119 05/19/23 0130  NA 130*  --  130* 132* 134* 138  K 3.8  --  3.6 3.1* 3.7 3.2*  CL 82*  --  82* 84* 83* 83*   CO2 31  --  32 35* 36* 36*  GLUCOSE 484* 428* 480* 366* 232* 269*  BUN 157*  --  153* 151* 146* 135*  CREATININE 2.79*  --  2.44* 2.36* 1.77* 1.87*  CALCIUM 8.8*  --  8.8* 9.0 9.2 9.7  MG 2.0  --   --  2.1 2.1 2.0  PHOS  --   --   --  4.8* 4.1 5.1*  ALBUMIN  --   --   --   --  2.8* 2.9*    Liver Function Tests: Recent Labs  Lab 05/18/23 0119 05/19/23 0130  ALBUMIN 2.8* 2.9*   No results for input(s): "LIPASE", "AMYLASE" in the last 168 hours. No results for input(s): "AMMONIA" in the last 168 hours. CBC: Recent Labs    05/14/23 2240 05/15/23 2351 05/17/23 0236 05/18/23 0119 05/19/23 0130  HGB 11.2* 10.6* 9.9* 10.3* 10.6*  MCV 91.3 90.7 89.6 89.4 90.9  FERRITIN  --   --  111  --   --   TIBC  --   --  437  --   --   IRON  --   --  43  --   --     Cardiac Enzymes: No results for input(s): "CKTOTAL", "CKMB", "CKMBINDEX", "TROPONINI" in the last 168 hours. CBG: Recent Labs  Lab 05/18/23 0816 05/18/23 1254 05/18/23  1603 05/18/23 2108 05/19/23 0811  GLUCAP 221* 203* 271* 285* 301*    Iron Studies:  Recent Labs    05/17/23 0236  IRON 43  TIBC 437  FERRITIN 111   Studies/Results: No results found.  Medications: Infusions:  ferric gluconate (FERRLECIT) IVPB Stopped (05/18/23 1532)     Scheduled Medications:  acetaminophen  325 mg Oral Q4H   acetaZOLAMIDE  250 mg Oral Once   amLODipine  10 mg Oral Daily   aspirin EC  81 mg Oral Daily   atorvastatin  80 mg Oral Daily   brinzolamide  1 drop Both Eyes TID   And   brimonidine  1 drop Both Eyes TID   enoxaparin (LOVENOX) injection  80 mg Subcutaneous Q24H   furosemide  80 mg Intravenous TID   gabapentin  300 mg Oral QHS   insulin aspart  0-20 Units Subcutaneous TID WC   insulin aspart  16 Units Subcutaneous TID AC   insulin glargine-yfgn  55 Units Subcutaneous BID   latanoprost  1 drop Both Eyes Daily   metolazone  5 mg Oral Q M,W,F   mometasone-formoterol  2 puff Inhalation BID   pantoprazole  40 mg  Oral Daily   prednisoLONE acetate  1 drop Both Eyes QID   prochlorperazine  10 mg Oral Once   rOPINIRole  1 mg Oral QHS   spironolactone  25 mg Oral Daily   I have reviewed scheduled and prn medications.  Physical Exam: General: Appears nontoxic, eating breakfast  Heart:RRR, s1s2 nl Lungs: Respiratory effort normal. Abdomen: Obese, soft  Extremities: Slight pedal edema Dialysis Access: None    Alfredo Martinez MD PGY2 05/19/2023,10:03 AM  LOS: 3 days

## 2023-05-19 NOTE — Assessment & Plan Note (Signed)
Morbidly obese patient on 3-4 L [place] at baseline. Will continue oxygen supplementation and BiPAP nightly -Continue Oxygen supplement -BiPAP nightly

## 2023-05-19 NOTE — Assessment & Plan Note (Signed)
P/w precordial chest pain after RHC on 6/6. ACS workup unremarkable. Recent cocaine use. -Appreciate HF team recs, diuresis as noted -cardiac monitoring -ASA, statin

## 2023-05-19 NOTE — Assessment & Plan Note (Signed)
Poorly controlled DM with most recent A1c of 13.5 a month ago. Home meds include Tresiba 70 units twice daily and NovoLog 45u 3 times daily. Blood glucose stable in 200-300s overnight. -Semglee 60 units BID, adjust as needed -Novolog 20u TID WC -Resistant SSI with CBGs before every meal and nightly

## 2023-05-19 NOTE — Inpatient Diabetes Management (Signed)
Inpatient Diabetes Program Recommendations  AACE/ADA: New Consensus Statement on Inpatient Glycemic Control (2015)  Target Ranges:  Prepandial:   less than 140 mg/dL      Peak postprandial:   less than 180 mg/dL (1-2 hours)      Critically ill patients:  140 - 180 mg/dL   Lab Results  Component Value Date   GLUCAP 301 (H) 05/19/2023   HGBA1C 13.6 (H) 04/13/2023    Review of Glycemic Control  Latest Reference Range & Units 05/18/23 08:16 05/18/23 12:54 05/18/23 16:03 05/18/23 21:08 05/19/23 08:11  Glucose-Capillary 70 - 99 mg/dL 161 (H) 096 (H) 045 (H) 285 (H) 301 (H)   Diabetes history: DM  Outpatient Diabetes medications:  Dexcom sensor Novolog 45 units tid with meals Tresiba 70 units bid Current orders for Inpatient glycemic control:  Novolog 0-20 units tid with meals Semglee 50 units bid Novolog 13 units TID   Note: utilizing pts weight and home medications as a reference pt is very resistant to insulin. Will need larger increases in insulin inpatient.  -   Pt received 111 units of Novolog in the last 24 hours.  Inpatient Diabetes Program Recommendations:     Consider: - adding CHO modified to diet if appropriate - Increasing Semglee 60 units BID -Increasing Novolog 20 units TID (hold if patient eats less than 50% or NPO).    Patient was seen by DM coordinator on 04/27/23 due to high A1C.  Needs close f/u with [facility] endocrinology.    Thanks, Christena Deem RN, MSN, BC-ADM Inpatient Diabetes Coordinator Team Pager [phone] (8a-5p)

## 2023-05-20 DIAGNOSIS — E119 Type 2 diabetes mellitus without complications: Secondary | ICD-10-CM | POA: Diagnosis not present

## 2023-05-20 DIAGNOSIS — R32 Unspecified urinary incontinence: Secondary | ICD-10-CM | POA: Diagnosis not present

## 2023-05-20 DIAGNOSIS — Z89512 Acquired absence of left leg below knee: Secondary | ICD-10-CM | POA: Diagnosis not present

## 2023-05-20 DIAGNOSIS — R7989 Other specified abnormal findings of blood chemistry: Secondary | ICD-10-CM | POA: Diagnosis not present

## 2023-05-20 DIAGNOSIS — I50810 Right heart failure, unspecified: Secondary | ICD-10-CM | POA: Diagnosis not present

## 2023-05-20 DIAGNOSIS — I5033 Acute on chronic diastolic (congestive) heart failure: Secondary | ICD-10-CM | POA: Diagnosis not present

## 2023-05-20 LAB — RENAL FUNCTION PANEL
Albumin: 2.9 g/dL — ABNORMAL LOW (ref 3.5–5.0)
Anion gap: 14 (ref 5–15)
BUN: 119 mg/dL — ABNORMAL HIGH (ref 6–20)
CO2: 36 mmol/L — ABNORMAL HIGH (ref 22–32)
Calcium: 9.5 mg/dL (ref 8.9–10.3)
Chloride: 86 mmol/L — ABNORMAL LOW (ref 98–111)
Creatinine, Ser: 1.87 mg/dL — ABNORMAL HIGH (ref 0.44–1.00)
GFR, Estimated: 33 mL/min — ABNORMAL LOW (ref >60)
Glucose, Bld: 281 mg/dL — ABNORMAL HIGH (ref 70–99)
Phosphorus: 4.7 mg/dL — ABNORMAL HIGH (ref 2.5–4.6)
Potassium: 3.5 mmol/L (ref 3.5–5.1)
Sodium: 136 mmol/L (ref 135–145)

## 2023-05-20 LAB — MAGNESIUM: Magnesium: 2 mg/dL (ref 1.7–2.4)

## 2023-05-20 LAB — GLUCOSE, CAPILLARY
Glucose-Capillary: 259 mg/dL — ABNORMAL HIGH (ref 70–99)
Glucose-Capillary: 329 mg/dL — ABNORMAL HIGH (ref 70–99)
Glucose-Capillary: 254 mg/dL — ABNORMAL HIGH (ref 70–99)
Glucose-Capillary: 290 mg/dL — ABNORMAL HIGH (ref 70–99)

## 2023-05-20 MED ORDER — POTASSIUM CHLORIDE CRYS ER 20 MEQ PO TBCR
40.0000 meq | EXTENDED_RELEASE_TABLET | Freq: Once | ORAL | Status: AC
  Administered 2023-05-20: 40 meq via ORAL
  Filled 2023-05-20: qty 2

## 2023-05-20 MED ORDER — POLYETHYLENE GLYCOL 3350 17 G PO PACK
17.0000 g | PACK | Freq: Every day | ORAL | Status: DC | PRN
  Administered 2023-05-24: 17 g via ORAL
  Filled 2023-05-20: qty 1

## 2023-05-20 MED ORDER — INSULIN ASPART 100 UNIT/ML IJ SOLN
25.0000 [IU] | Freq: Three times a day (TID) | INTRAMUSCULAR | Status: DC
  Administered 2023-05-20 – 2023-05-21 (×3): 25 [IU] via SUBCUTANEOUS

## 2023-05-20 MED ORDER — INSULIN GLARGINE-YFGN 100 UNIT/ML ~~LOC~~ SOLN
65.0000 [IU] | Freq: Two times a day (BID) | SUBCUTANEOUS | Status: DC
  Administered 2023-05-20 (×2): 65 [IU] via SUBCUTANEOUS
  Filled 2023-05-20 (×4): qty 0.65

## 2023-05-20 MED ORDER — LIDOCAINE 5 % EX PTCH
1.0000 | MEDICATED_PATCH | CUTANEOUS | Status: DC
  Administered 2023-05-20 – 2023-05-27 (×8): 1 via TRANSDERMAL
  Filled 2023-05-20 (×8): qty 1

## 2023-05-20 MED ORDER — POTASSIUM CHLORIDE CRYS ER 20 MEQ PO TBCR: 20.0000 meq | EXTENDED_RELEASE_TABLET | Freq: Two times a day (BID) | ORAL | Status: DC

## 2023-05-20 MED ORDER — TRAZODONE HCL 100 MG PO TABS
100.0000 mg | ORAL_TABLET | Freq: Once | ORAL | Status: AC
  Administered 2023-05-20: 100 mg via ORAL
  Filled 2023-05-20: qty 1

## 2023-05-20 MED ORDER — CYCLOBENZAPRINE HCL 10 MG PO TABS
5.0000 mg | ORAL_TABLET | Freq: Once | ORAL | Status: AC
  Administered 2023-05-20: 5 mg via ORAL
  Filled 2023-05-20: qty 1

## 2023-05-20 NOTE — Assessment & Plan Note (Signed)
Morbidly obese patient on 3-4 L [place] at baseline. Will continue oxygen supplementation and BiPAP nightly -Continue Oxygen supplement -BiPAP nightly 

## 2023-05-20 NOTE — Progress Notes (Signed)
[facility] NEPHROLOGY PROGRESS NOTE  Assessment/ Plan: Pt is a 47 y.o. female with PMHx HFpEF/RV failure, OSA, super morbid obesity, uncontrolled DM, CKD IIIb, recurrent cocaine use, and left leg BKA/right transmetatarsal consulted for worsening kidney function in setting of diuresis.   # Renal- Scr 1.87, BUN 135>119. Bicarb 36, K 3.5 (repletion 20 BID x2 doses). Currently on Lasix 80 mg TID (increasing as kidney function is tolerating), Metolazone 3xweekly M/W/F, received spot dosing of Metolazone 6/11. Added spironolactone to regimen 25 mg, BP tolerating. Tolerating diuresis. UOP 900L overnight, last 12 hours not documented. Doing well with UOP and daily weights decreasing, hold current dosage.  # Anemia- s/p IV iron Hgb 10.6 6/13 # HTN/volume- Hypervolemia, systolics largely<150s.  # HFpEf  PVH/PAH - per cardiology, diuresing as above # OSA  OHS - On Bipap at night, needs 3-4 L during the day.   # DM2 - per primary  #Hyperphosphatemia-- Phos 5.1, add binder as indicated #Metabolic alkalosis-- Bicarb 36, s/p 2 doses of acetazolamide, hold for now. Likely multifactorial in setting of diuresis and chronic respiratory disease.     Subjective:  Eating lunch and doing well. Complains of some nausea, no vomiting or diarrhea.   Objective Vital signs in last 24 hours: Vitals:   05/19/23 0812 05/19/23 1243 05/19/23 2141 05/20/23 0613  BP: (!) 162/87 (!) 150/90 (!) 151/68 117/65  Pulse: 95 96 94 85  Resp: 17 18 20 16   Temp: 98.7 F (37.1 C) 97.6 F (36.4 C) 97.9 F (36.6 C) 97.6 F (36.4 C)  TempSrc: Axillary Oral Oral Axillary  SpO2: 97% 100% 96% 97%  Weight:    (!) 150.1 kg  Height:       Weight change: -9.559 kg  Intake/Output Summary (Last 24 hours) at 05/20/2023 1004 Last data filed at 05/20/2023 0748 Gross per 24 hour  Intake 600 ml  Output 3700 ml  Net -3100 ml       Labs: RENAL PANEL Recent Labs  Lab 05/15/23 2351 05/16/23 1422 05/16/23 2212  05/17/23 0236 05/18/23 0119 05/19/23 0130 05/20/23 0207  NA 130*  --  130* 132* 134* 138 136  K 3.8  --  3.6 3.1* 3.7 3.2* 3.5  CL 82*  --  82* 84* 83* 83* 86*  CO2 31  --  32 35* 36* 36* 36*  GLUCOSE 484*   < > 480* 366* 232* 269* 281*  BUN 157*  --  153* 151* 146* 135* 119*  CREATININE 2.79*  --  2.44* 2.36* 1.77* 1.87* 1.87*  CALCIUM 8.8*  --  8.8* 9.0 9.2 9.7 9.5  MG 2.0  --   --  2.1 2.1 2.0 2.0  PHOS  --   --   --  4.8* 4.1 5.1* 4.7*  ALBUMIN  --   --   --   --  2.8* 2.9* 2.9*   < > = values in this interval not displayed.    Liver Function Tests: Recent Labs  Lab 05/18/23 0119 05/19/23 0130 05/20/23 0207  ALBUMIN 2.8* 2.9* 2.9*   No results for input(s): "LIPASE", "AMYLASE" in the last 168 hours. No results for input(s): "AMMONIA" in the last 168 hours. CBC: Recent Labs    05/14/23 2240 05/15/23 2351 05/17/23 0236 05/18/23 0119 05/19/23 0130  HGB 11.2* 10.6* 9.9* 10.3* 10.6*  MCV 91.3 90.7 89.6 89.4 90.9  FERRITIN  --   --  111  --   --   TIBC  --   --  437  --   --  IRON  --   --  43  --   --     Cardiac Enzymes: No results for input(s): "CKTOTAL", "CKMB", "CKMBINDEX", "TROPONINI" in the last 168 hours. CBG: Recent Labs  Lab 05/19/23 0811 05/19/23 1245 05/19/23 1617 05/19/23 2136 05/20/23 0729  GLUCAP 301* 351* 232* 249* 259*    Iron Studies:  No results for input(s): "IRON", "TIBC", "TRANSFERRIN", "FERRITIN" in the last 72 hours.  Studies/Results: No results found.  Medications: Infusions:     Scheduled Medications:  acetaminophen  325 mg Oral Q4H   amLODipine  10 mg Oral Daily   aspirin EC  81 mg Oral Daily   atorvastatin  80 mg Oral Daily   brinzolamide  1 drop Both Eyes TID   And   brimonidine  1 drop Both Eyes TID   enoxaparin (LOVENOX) injection  80 mg Subcutaneous Q24H   furosemide  80 mg Intravenous TID   gabapentin  300 mg Oral QHS   insulin aspart  0-20 Units Subcutaneous TID WC   insulin aspart  25 Units Subcutaneous  TID AC   insulin glargine-yfgn  65 Units Subcutaneous BID   latanoprost  1 drop Both Eyes Daily   metolazone  5 mg Oral Q M,W,F   mometasone-formoterol  2 puff Inhalation BID   pantoprazole  40 mg Oral Daily   prednisoLONE acetate  1 drop Both Eyes QID   prochlorperazine  10 mg Oral Once   rOPINIRole  1 mg Oral QHS   spironolactone  25 mg Oral Daily   I have reviewed scheduled and prn medications.  Physical Exam: General: Appears nontoxic, eating breakfast  Heart:RRR, s1s2 nl Lungs: Respiratory effort normal. No crackles heard  Abdomen: Obese, soft  Extremities: Slight pedal edema Dialysis Access: None    Alfredo Martinez MD PGY2 05/20/2023,10:04 AM  LOS: 4 days

## 2023-05-20 NOTE — Progress Notes (Signed)
Pt places self on cpap.

## 2023-05-20 NOTE — Progress Notes (Signed)
Daily Progress Note Intern Pager: [phone]  Patient name: [name] Medical record number: [id number] Date of birth: Apr 18, 1976 Age: 47 y.o. Gender: female  Primary Care Provider: Latrelle Dodrill, MD Consultants: nephrology, heart failure Code Status: full   Pt Overview and Major Events to Date:  6/9 admitted   Assessment and Plan:   [name] is a 47 y.o. female p/w chest pain in the setting of recent RHC, heart failure, significant kidney disease, diabetes, and OSA.  Hospital Problem List      Hospital     * (Principal) Chest pain     P/w precordial chest pain after RHC on 6/6. ACS workup unremarkable.  Recent cocaine use. -Appreciate HF team recs, diuresis as noted -cardiac monitoring -ASA, statin        Obstructive sleep apnea (adult) (pediatric)     Morbidly obese patient on 3-4 L [place] at baseline. Will continue oxygen  supplementation and BiPAP nightly -Continue Oxygen supplement -BiPAP nightly        Type 2 diabetes mellitus with hyperglycemia ([name])     Poorly controlled DM with most recent A1c of 13.5 a month ago. Home  meds include Tresiba 70 units twice daily and NovoLog 45u 3 times daily.  Blood glucose stable in 200s overnight. -Semglee 65 units BID, adjust as needed -Novolog 25u TID WC -Resistant SSI with CBGs before every meal and nightly        Azotemia     Hospitalized 2x  last month, first for uremia with unclear etiology and  then for AKI on CKD 4 which improved with IV fluids and decreasing  torsemide and metolazone.  Discharged with plans to follow-up with  nephrology outpatient. BUN remains elevated.  Creatinine seems to be  improving with diuresis. -nephrology consulted, appreciate recs -monitor renal function        Acute on chronic diastolic heart failure ([name])     Last echo 3/24 with EF 55-60%.  Follows with cardiology.  Had RHC on  6/6 showing pulmonary hypertension, RV failure.  Home meds include  torsemide 100 mg in  the morning and 50 mg in the evening, metolazone 5 mg  3 days/week.  Difficult to assess volume status.  CXR shows mild vascular  congestion. - documented since admit. -Heart Failure team and nephrology consulted, appreciate recs -diuresis per nephrology -home metolazone -Strict I/os -Daily weights        Precordial chest pain     RVF (right ventricular failure) ([name])    FEN/GI: reg diet (refused carb modified diet) PPx: lovenox Dispo:pending management as above  Subjective:  NAEON, feeling little bit better this morning.  Denies pain.  Occasionally feels nauseated throughout the day  Objective: Temp:  [97.6 F (36.4 C)-97.9 F (36.6 C)] 97.6 F (36.4 C) (06/14 1478) Pulse Rate:  [85-96] 85 (06/14 0613) Resp:  [16-20] 16 (06/14 0613) BP: (117-151)/(65-90) 117/65 (06/14 0613) SpO2:  [96 %-100 %] 97 % (06/14 0613) Weight:  [150.1 kg] 150.1 kg (06/14 2956) Physical Exam: General: NAD, sitting on side of bed Cardiovascular: RRR, difficult to appreciate Respiratory: Difficult to appreciate, on supplemental O2 Abdomen: Soft NT/ND  Laboratory: Most recent CBC Lab Results  Component Value Date   WBC 8.7 05/19/2023   HGB 10.6 (L) 05/19/2023   HCT 33.0 (L) 05/19/2023   MCV 90.9 05/19/2023   PLT 344 05/19/2023   Most recent BMP    Latest Ref Rng & Units 05/20/2023    2:07 AM  BMP  Glucose 70 - 99 mg/dL 161   BUN 6 - 20 mg/dL 096   Creatinine 0.45 - 1.00 mg/dL 4.09   Sodium 811 - 914 mmol/L 136   Potassium 3.5 - 5.1 mmol/L 3.5   Chloride 98 - 111 mmol/L 86   CO2 22 - 32 mmol/L 36   Calcium 8.9 - 10.3 mg/dL 9.5     Magnesium 2.0   Vonna Drafts, MD 05/20/2023, 12:10 PM  PGY-1, [facility] FPTS Intern pager: [phone], text pages welcome Secure chat group [facility] Teaching Service

## 2023-05-20 NOTE — TOC Progression Note (Signed)
Transition of Care [facility]) - Progression Note    Patient Details  Name: [name] MRN: [id number] Date of Birth: 1976/02/18  Transition [facility]) CM/SW Contact  Graves-Bigelow, Lamar Laundry, RN Phone Number: 05/20/2023, 11:22 AM  Clinical Narrative:  Patient was discussed in morning rounds-continues on IV Lasix. Patient reports that she is still living @ [facility] [address] [place], [place] [address]. Patient will return to the motel once stable. Patient has utilized outpatient PT/OT in the past and patient is agreeable to an ambulatory referral was submitted to [facility] for outpatient PT/OT. The office will call the patient for a visit time. Patient states she has Medicaid transportation to appointments. Patient has DME wheelchair and prosthesis-prosthesis is at the bedside. Patient will need PTAR for transportation home. Case Manager will continue to follow for additional transition of care needs as the patient progresses.   Expected Discharge Plan: Home w Home Health Services Barriers to Discharge: Continued Medical Work up  Expected Discharge Plan and Services   Discharge Planning Services: CM Consult   Living arrangements for the past 2 months: Hotel/Motel Expected Discharge Date: 05/15/23                  Social Determinants of Health (SDOH) Interventions SDOH Screenings   Food Insecurity: Food Insecurity Present (05/15/2023)  Housing: Low Risk  (05/15/2023)  Recent Concern: Housing - Medium Risk (04/27/2023)  Transportation Needs: No Transportation Needs (05/15/2023)  Utilities: Not At Risk (05/15/2023)  Alcohol Screen: Low Risk  (04/14/2023)  Depression (PHQ2-9): Medium Risk (03/07/2023)  Financial Resource Strain: High Risk (04/15/2023)  Stress: Stress Concern Present (02/19/2021)  Tobacco Use: Medium Risk (05/19/2023)    Readmission Risk Interventions    03/22/2023   10:47 AM 05/24/2022   11:33 AM  Readmission Risk Prevention Plan  Transportation  Screening Complete Complete  Medication Review Oceanographer) Complete Complete  PCP or Specialist appointment within 3-5 days of discharge Complete Complete  HRI or Home Care Consult Complete Complete  SW Recovery Care/Counseling Consult Complete Complete  Palliative Care Screening Not Applicable Not Applicable  Skilled Nursing Facility Not Applicable Patient Refused

## 2023-05-20 NOTE — Assessment & Plan Note (Signed)
P/w precordial chest pain after RHC on 6/6. ACS workup unremarkable. Recent cocaine use. -Appreciate HF team recs, diuresis as noted -cardiac monitoring -ASA, statin 

## 2023-05-20 NOTE — Assessment & Plan Note (Signed)
Last echo 3/24 with EF 55-60%.  Follows with cardiology.  Had RHC on 6/6 showing pulmonary hypertension, RV failure.  Home meds include torsemide 100 mg in the morning and 50 mg in the evening, metolazone 5 mg 3 days/week.  Difficult to assess volume status.  CXR shows mild vascular congestion. - documented since admit. -Heart Failure team and nephrology consulted, appreciate recs -diuresis per nephrology -home metolazone -Strict I/os -Daily weights

## 2023-05-20 NOTE — Progress Notes (Addendum)
Scheduled for right heart cath on Monday, June 17 with Dr. Gasper Lloyd to evaluate for volume status.  Consent has been obtained and documented in today's progress note.  Orders are in and Mondays n.p.o. order will be released today.  I have reached out to primary team for adjustment of insulin in anticipation for n.p.o. status on day of catheterization.

## 2023-05-20 NOTE — Progress Notes (Addendum)
Rounding Note    Patient Name: [name] Date of Encounter: 05/20/2023  [facility] Cardiologist: Tonny Bollman, MD   Subjective   Continued improvement in energy.  Patient is upright and conversational with me for the first time.  Does want to ambulate today.  Will arrange PT.   Scheduled Meds:  acetaminophen  325 mg Oral Q4H   amLODipine  10 mg Oral Daily   aspirin EC  81 mg Oral Daily   atorvastatin  80 mg Oral Daily   brinzolamide  1 drop Both Eyes TID   And   brimonidine  1 drop Both Eyes TID   enoxaparin (LOVENOX) injection  80 mg Subcutaneous Q24H   furosemide  80 mg Intravenous TID   gabapentin  300 mg Oral QHS   insulin aspart  0-20 Units Subcutaneous TID WC   insulin aspart  20 Units Subcutaneous TID AC   insulin glargine-yfgn  60 Units Subcutaneous BID   latanoprost  1 drop Both Eyes Daily   metolazone  5 mg Oral Q M,W,F   mometasone-formoterol  2 puff Inhalation BID   pantoprazole  40 mg Oral Daily   potassium chloride  20 mEq Oral BID   prednisoLONE acetate  1 drop Both Eyes QID   prochlorperazine  10 mg Oral Once   rOPINIRole  1 mg Oral QHS   spironolactone  25 mg Oral Daily   Continuous Infusions:   PRN Meds: albuterol, diclofenac Sodium, ipratropium-albuterol, nitroGLYCERIN, mouth rinse   Vital Signs    Vitals:   05/19/23 0812 05/19/23 1243 05/19/23 2141 05/20/23 0613  BP: (!) 162/87 (!) 150/90 (!) 151/68 117/65  Pulse: 95 96 94 85  Resp: 17 18 20 16   Temp: 98.7 F (37.1 C) 97.6 F (36.4 C) 97.9 F (36.6 C) 97.6 F (36.4 C)  TempSrc: Axillary Oral Oral Axillary  SpO2: 97% 100% 96% 97%  Weight:    (!) 150.1 kg  Height:        Intake/Output Summary (Last 24 hours) at 05/20/2023 0835 Last data filed at 05/20/2023 0748 Gross per 24 hour  Intake 600 ml  Output 3700 ml  Net -3100 ml       05/20/2023    6:13 AM 05/19/2023    5:45 AM 05/18/2023    3:00 PM  Last 3 Weights  Weight (lbs) 331 lb 340 lb 2.7 oz 352 lb 1.2 oz   Weight (kg) 150.141 kg 154.3 kg 159.7 kg      Telemetry    Normal sinus rhythm heart rate 80s. - Personally Reviewed  ECG    No new tracings- Personally Reviewed  Physical Exam  Patient weighs 330 pounds.  Fluid status hard to determine.  GEN: Morbidly obese Neck: Not able to assess due to excessive body habitus  cardiac: RRR, no murmurs, rubs, or gallops.  Respiratory: Clear to auscultation bilaterally. GI: Soft, nontender, non-distended  MS: left BKA, right transmetatarsal ampuation.  Neuro:  Nonfocal  Psych: Normal affect   Labs    High Sensitivity Troponin:   Recent Labs  Lab 05/15/23 2351 05/16/23 2044 05/16/23 2212 05/18/23 0458 05/18/23 0635  TROPONINIHS 39* 36* 38* 40* 43*      Chemistry Recent Labs  Lab 05/18/23 0119 05/19/23 0130 05/20/23 0207  NA 134* 138 136  K 3.7 3.2* 3.5  CL 83* 83* 86*  CO2 36* 36* 36*  GLUCOSE 232* 269* 281*  BUN 146* 135* 119*  CREATININE 1.77* 1.87* 1.87*  CALCIUM 9.2 9.7  9.5  MG 2.1 2.0 2.0  ALBUMIN 2.8* 2.9* 2.9*  GFRNONAA 36* 33* 33*  ANIONGAP 15 19* 14     Lipids No results for input(s): "CHOL", "TRIG", "HDL", "LABVLDL", "LDLCALC", "CHOLHDL" in the last 168 hours.  Hematology Recent Labs  Lab 05/17/23 0236 05/18/23 0119 05/19/23 0130  WBC 8.8 9.4 8.7  RBC 3.47* 3.58* 3.63*  HGB 9.9* 10.3* 10.6*  HCT 31.1* 32.0* 33.0*  MCV 89.6 89.4 90.9  MCH 28.5 28.8 29.2  MCHC 31.8 32.2 32.1  RDW 16.5* 16.4* 16.8*  PLT 317 333 344    Thyroid No results for input(s): "TSH", "FREET4" in the last 168 hours.  BNP Recent Labs  Lab 05/14/23 2240  BNP 19.3     DDimer No results for input(s): "DDIMER" in the last 168 hours.   Radiology    No results found.  Cardiac Studies   RHC 05/12/23: 1. Elevated right > left heart filling pressures.  2. Mixed pulmonary venous/pulmonary arterial hypertension.  3. Low cardiac output and low PAPi, suspect primarily RV failure.    Suspect diastolic CHF, pulmonary  hypertension due to elevated LA pressure as well as OHS/OSA. Significant RV failure.  Will increase torsemide to 100 qam/50 qpm and add KCl 20 daily.  I worry that she will need HD soon based on renal dysfunction.   Echocardiogram 02/22/2023  1. Left ventricular ejection fraction, by estimation, is 55 to 60%. The  left ventricle has normal function. Left ventricular endocardial border  not optimally defined to evaluate regional wall motion. Left ventricular  diastolic parameters are consistent  with Grade II diastolic dysfunction (pseudonormalization).   2. Right ventricular systolic function was not well visualized. The right  ventricular size is not well visualized. Tricuspid regurgitation signal is  inadequate for assessing PA pressure.   3. The mitral valve was not well visualized. No evidence of mitral valve  regurgitation.   4. The aortic valve was not well visualized. Aortic valve regurgitation  is not visualized. No aortic stenosis is present.   5. The inferior vena cava is dilated in size with <50% respiratory  variability, suggesting right atrial pressure of 15 mmHg.   6. Technically difficult study with very poor acoustic windows. The right  side of the heart was minimally visualized.   Patient Profile     47 y.o. female  hx of HFpEF/RV failure, OSA, Super morbid obesity, uncontrolled DM, CKD IIIb, cocaine use, non-compliance and left leg BKA/right transmetatarsal who is being seen for CHF.  Assessment & Plan    Acute on chronic HFpEF RV failure Right heart catheterization on 05/12/2023 showed primarily right-sided heart failure.  Echocardiogram showing LVEF 55 to 60% with grade 2 diastolic dysfunction.  Elevated right atrial pressure of 15.  BNP 19.3 but given excessive body habitus not a great representation.  She has been receiving aggressive diuresis with nephrology's help.  Renal function has improved on IV diuretics.  Creatinine 1.87, baseline difficult to determine.   Appears to be around 1.5 prior to admission however has been in the mid twos during this admission.   Difficult to assess volume status on exam.  Plan to perform right heart catheterization on Monday to evaluate filling pressures.  Patient amenable to plan. Continue acetazolamide 250, IV Lasix Lasix 80 mg 3 times daily metolazone 5 mg Monday Wednesday Friday, spironolactone 25 mg, per nephro Not a candidate for SGLT2 inhibitor due to uncontrolled diabetes and significant concern for yeast infections. PT to evaluate and treat now  that she has her prosthetic leg back. Urine output has not been documented accurately at night.   Nonobstructive CAD Atypical chest pain EKG showing no acute ST-T wave changes.  Troponins mildly elevated but flat in the 40s.  Left heart catheterization in December 2021 showed mild diffuse disease throughout the LAD.  Continues to have intermittent complaints of chest pain that last for couple minutes.  Not likely to be related to ischemic disease. Continue aspirin and statin.  LDL 174 7 months ago. PTA Rosuvastatin 10 mg was switched to atorvastatin 80mg  with anticipation of worsening renal function and aggressive secondary prevention. Recheck LDL outpatient.   AKI on CKD III. Baseline creatinine appears to be in the mid twos.  Prior to admission was closer to 1.5.  Nephrology following.  Renal function has 1.87 for the past 2 days.  Uncontrolled diabetes mellitus A1c 13.1%  Anemia OSA on CPAP Acute on chronic hypoxic respiratory failure on 3 to 4 L via nasal cannula at home. PAD left BKA, right transmetatarsal amputation.  Wheelchair-bound. Morbid obesity Cocaine use, positive during this admission on UDS Noncompliance  For questions or updates, please contact [facility] Please consult [email or web address] for contact info under        Signed, Abagail Kitchens, PA-C  05/20/2023, 8:35 AM      Patient seen and examined.  Agree with above documentation.  On  exam, patient is alert and oriented, regular rate and rhythm, no murmurs, diminished breath sounds, persistent edema given habitus, s/p L BKA.  She is diuresing well, but volume status difficult to assess on exam.  Continue IV Lasix and will plan for RHC on Monday to evaluate filling pressures.    Risks and benefits of cardiac catheterization have been discussed with the patient.  These include bleeding, infection, kidney damage, stroke, heart attack, death.  The patient understands these risks and is willing to proceed.   Little Ishikawa, MD

## 2023-05-20 NOTE — Assessment & Plan Note (Addendum)
Poorly controlled DM with most recent A1c of 13.5 a month ago. Home meds include Tresiba 70 units twice daily and NovoLog 45u 3 times daily. Blood glucose stable in 200s overnight. -Semglee 65 units BID, adjust as needed -Novolog 25u TID WC -Resistant SSI with CBGs before every meal and nightly

## 2023-05-20 NOTE — Assessment & Plan Note (Signed)
Hospitalized 2x  last month, first for uremia with unclear etiology and then for AKI on CKD 4 which improved with IV fluids and decreasing torsemide and metolazone.  Discharged with plans to follow-up with nephrology outpatient. BUN remains elevated.  Creatinine seems to be improving with diuresis. -nephrology consulted, appreciate recs -monitor renal function 

## 2023-05-21 ENCOUNTER — Inpatient Hospital Stay (HOSPITAL_COMMUNITY): Payer: 59

## 2023-05-21 DIAGNOSIS — I5033 Acute on chronic diastolic (congestive) heart failure: Secondary | ICD-10-CM | POA: Diagnosis not present

## 2023-05-21 DIAGNOSIS — R52 Pain, unspecified: Secondary | ICD-10-CM | POA: Diagnosis not present

## 2023-05-21 DIAGNOSIS — R937 Abnormal findings on diagnostic imaging of other parts of musculoskeletal system: Secondary | ICD-10-CM

## 2023-05-21 DIAGNOSIS — R7989 Other specified abnormal findings of blood chemistry: Secondary | ICD-10-CM | POA: Diagnosis not present

## 2023-05-21 DIAGNOSIS — I50810 Right heart failure, unspecified: Secondary | ICD-10-CM | POA: Diagnosis not present

## 2023-05-21 DIAGNOSIS — R071 Chest pain on breathing: Secondary | ICD-10-CM

## 2023-05-21 LAB — RENAL FUNCTION PANEL
Albumin: 3 g/dL — ABNORMAL LOW (ref 3.5–5.0)
Anion gap: 19 — ABNORMAL HIGH (ref 5–15)
BUN: 129 mg/dL — ABNORMAL HIGH (ref 6–20)
CO2: 30 mmol/L (ref 22–32)
Calcium: 9.7 mg/dL (ref 8.9–10.3)
Chloride: 85 mmol/L — ABNORMAL LOW (ref 98–111)
Creatinine, Ser: 2.02 mg/dL — ABNORMAL HIGH (ref 0.44–1.00)
GFR, Estimated: 30 mL/min — ABNORMAL LOW (ref >60)
Glucose, Bld: 319 mg/dL — ABNORMAL HIGH (ref 70–99)
Phosphorus: 5.1 mg/dL — ABNORMAL HIGH (ref 2.5–4.6)
Potassium: 3.4 mmol/L — ABNORMAL LOW (ref 3.5–5.1)
Sodium: 134 mmol/L — ABNORMAL LOW (ref 135–145)

## 2023-05-21 LAB — GLUCOSE, CAPILLARY
Glucose-Capillary: 360 mg/dL — ABNORMAL HIGH (ref 70–99)
Glucose-Capillary: 414 mg/dL — ABNORMAL HIGH (ref 70–99)
Glucose-Capillary: 410 mg/dL — ABNORMAL HIGH (ref 70–99)
Glucose-Capillary: 363 mg/dL — ABNORMAL HIGH (ref 70–99)

## 2023-05-21 LAB — MAGNESIUM: Magnesium: 2 mg/dL (ref 1.7–2.4)

## 2023-05-21 IMAGING — CT CT ANGIO CHEST
2 of 6 series · 18 of 36 positions shown · IV contrast (OMNIPAQUE 350)
Comparison: 11/07/2020

CLINICAL DATA: Shortness of breath. Morbid obesity high clinical
probability for pulmonary embolism.

EXAM:
CT ANGIOGRAPHY CHEST WITH CONTRAST
TECHNIQUE: Multidetector CT imaging of the chest was performed using the
standard protocol during bolus administration of intravenous
contrast. Multiplanar CT image reconstructions and MIPs were
obtained to evaluate the vascular anatomy.

[Series 5: thins · axial · 0.83mm/px · z∈[+1328,+1571]mm · 17 of 275 slices shown]
[im 16/275  lung]
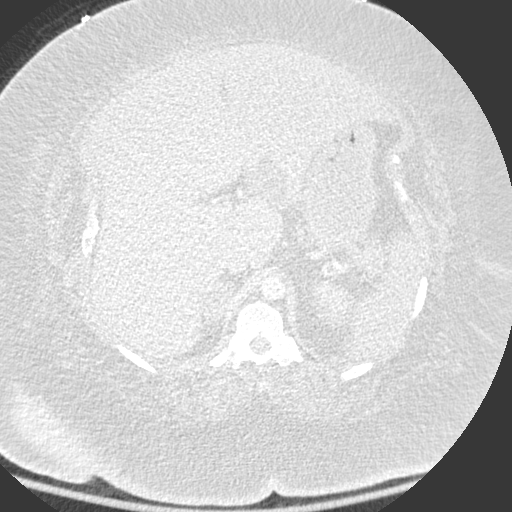
[im 31/275  mediastinal]
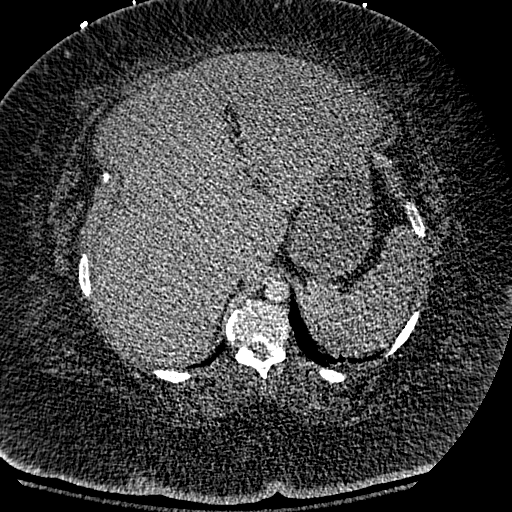
[im 46/275  lung]
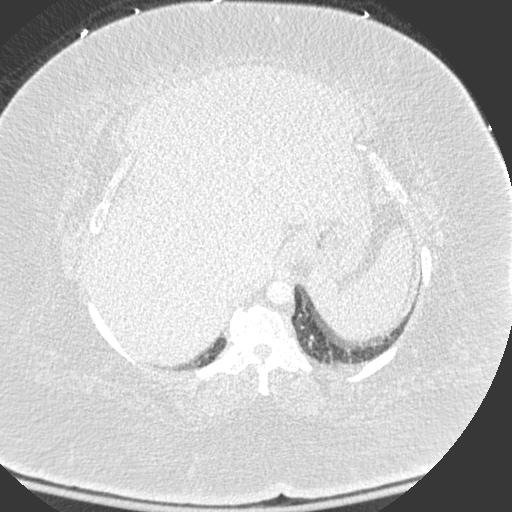
[im 61/275  mediastinal]
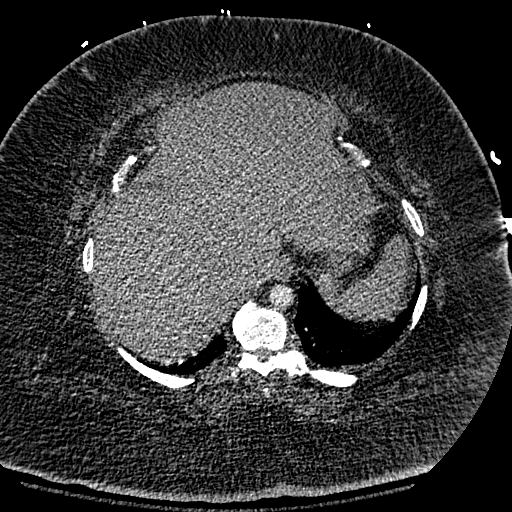
[im 77/275  lung]
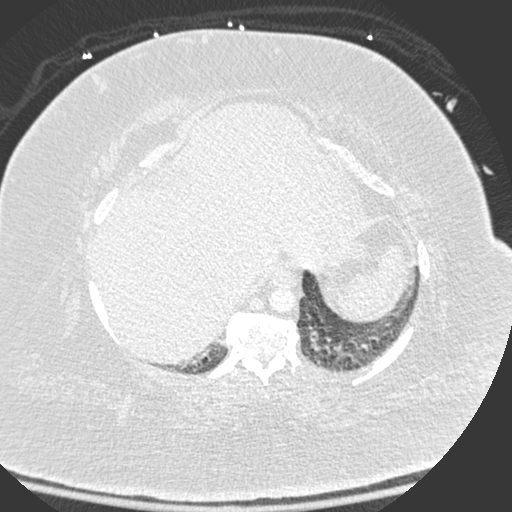
[im 92/275  mediastinal]
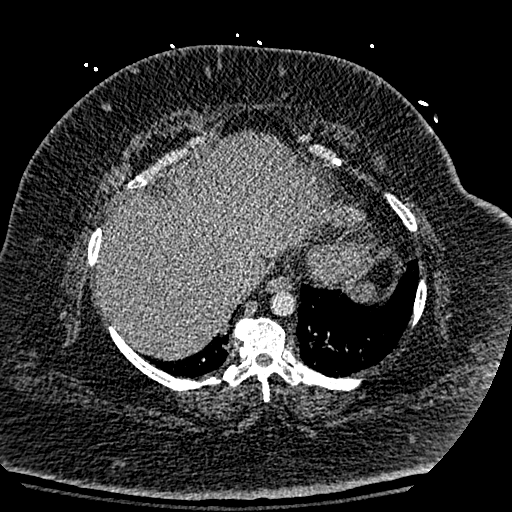
[im 107/275  lung]
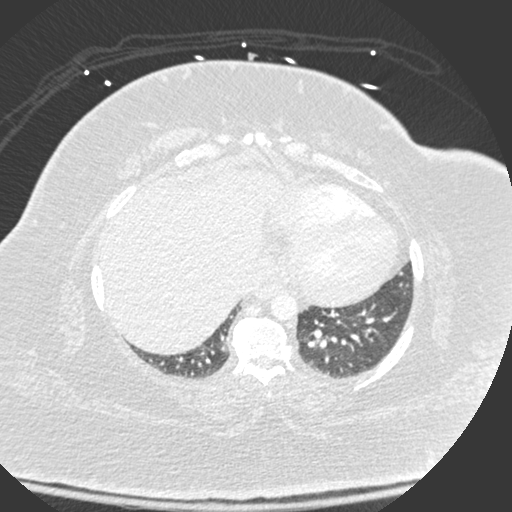
[im 122/275  mediastinal]
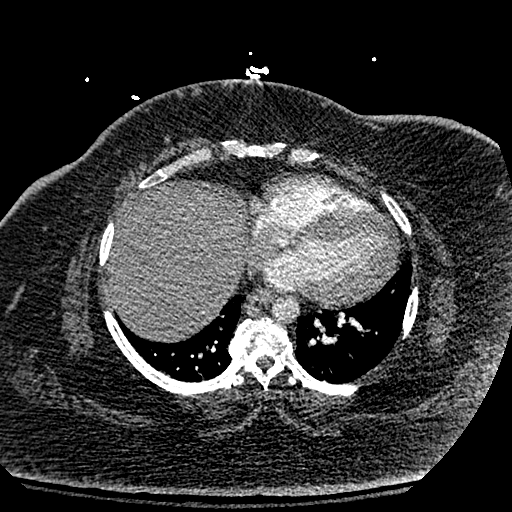
[im 138/275  lung]
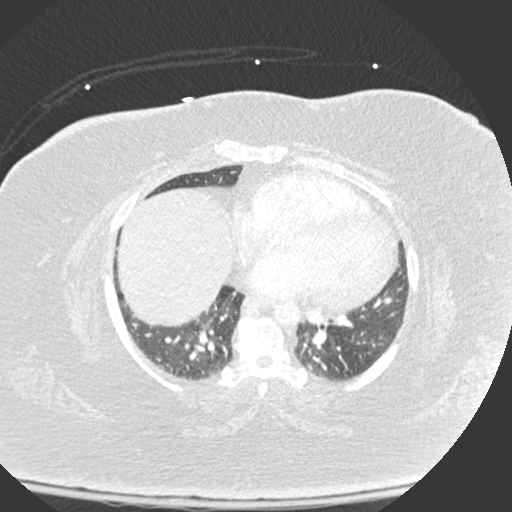
[im 153/275  mediastinal]
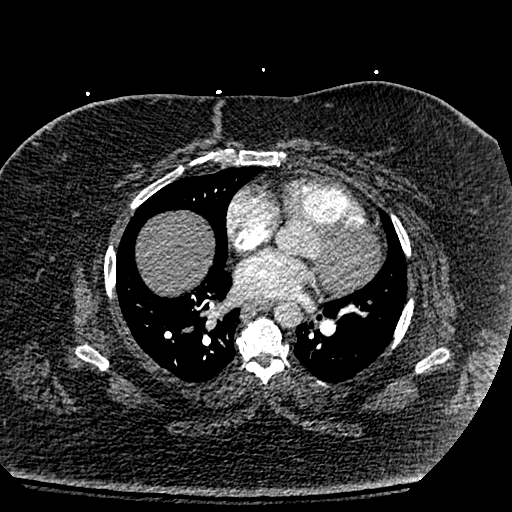
[im 168/275  lung]
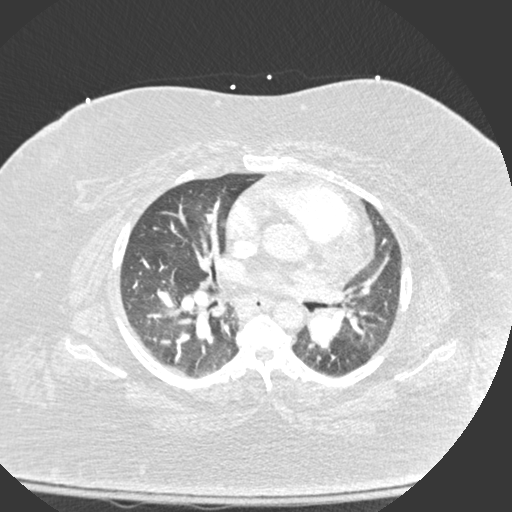
[im 183/275  mediastinal]
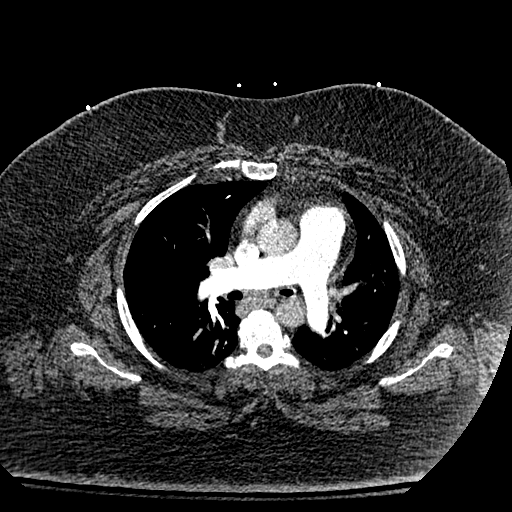
[im 198/275  lung]
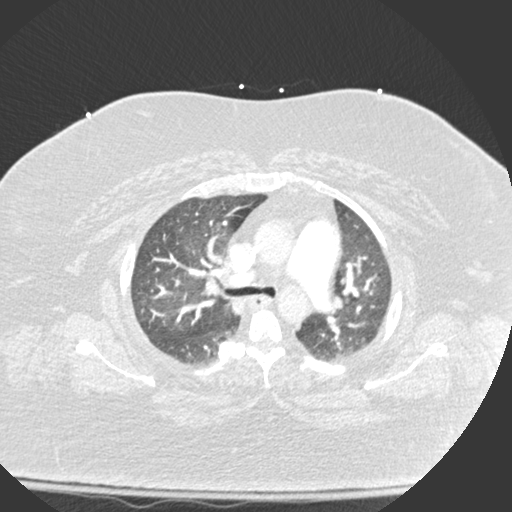
[im 214/275  mediastinal]
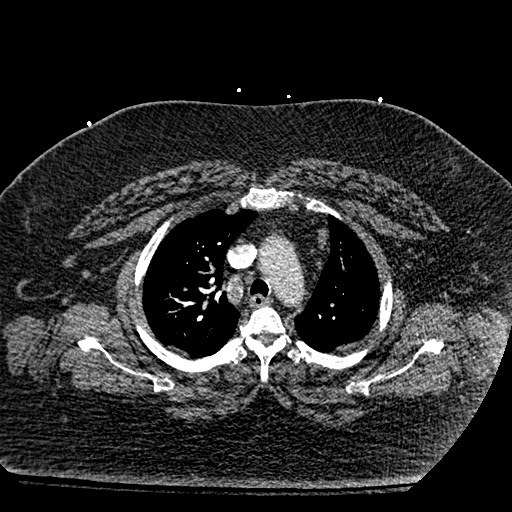
[im 229/275  lung]
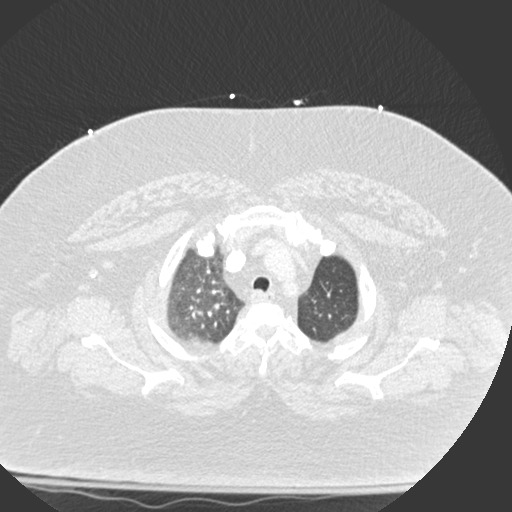
[im 244/275  mediastinal]
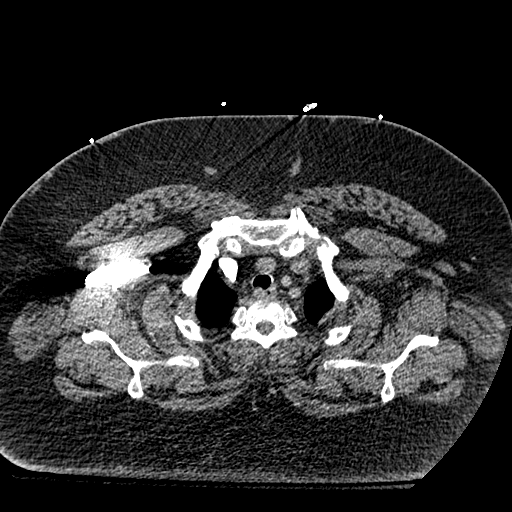
[im 259/275  lung]
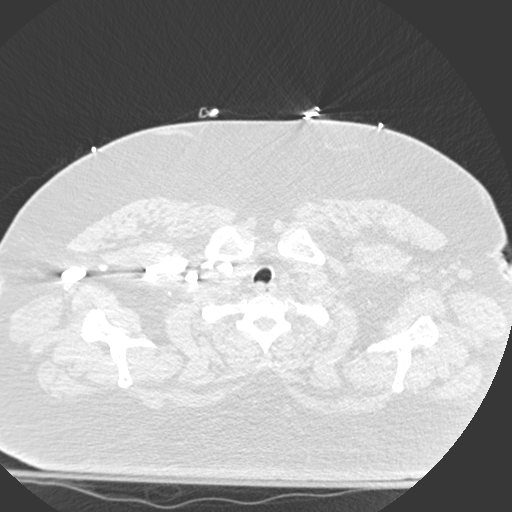

[Series 6: coronal mpr · coronal · 0.60mm/px · 1 of 222 slices shown]
[im 111/222  mediastinal]
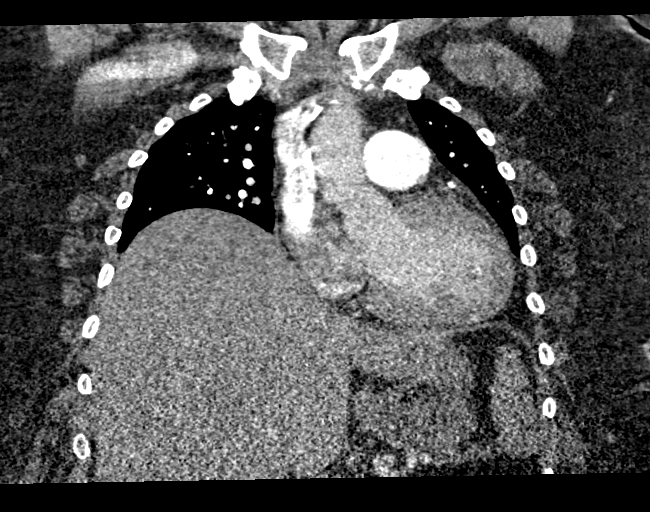

[18 of 36 positions shown; findings below may reference images not displayed]

RADIATION DOSE REDUCTION: This exam was performed according to the
departmental dose-optimization program which includes automated
exposure control, adjustment of the mA and/or kV according to
patient size and/or use of iterative reconstruction technique.

CONTRAST:  80mL OMNIPAQUE IOHEXOL 350 MG/ML SOLN
FINDINGS: Cardiovascular: Satisfactory opacification of pulmonary arteries
noted, and no pulmonary emboli identified. No evidence of thoracic
aortic dissection or aneurysm. Aortic and coronary atherosclerotic
calcification noted.

Mediastinum/Nodes: No masses or pathologically enlarged lymph nodes
identified.

Lungs/Pleura: No pulmonary mass, infiltrate, or effusion.

Upper abdomen: No acute findings.

Musculoskeletal: No suspicious bone lesions identified.

Review of the MIP images confirms the above findings.
IMPRESSION: No evidence of pulmonary embolism or other active disease.

Aortic Atherosclerosis ([id number]).

## 2023-05-21 IMAGING — DX DG CHEST 1V PORT
1 series · 1 of 1 positions shown · non-contrast
Comparison: Chest x-ray 03/08/2022.

CLINICAL DATA: 46-year-old female with history of shortness of
breath.

EXAM:
PORTABLE CHEST 1 VIEW

[chest ap]
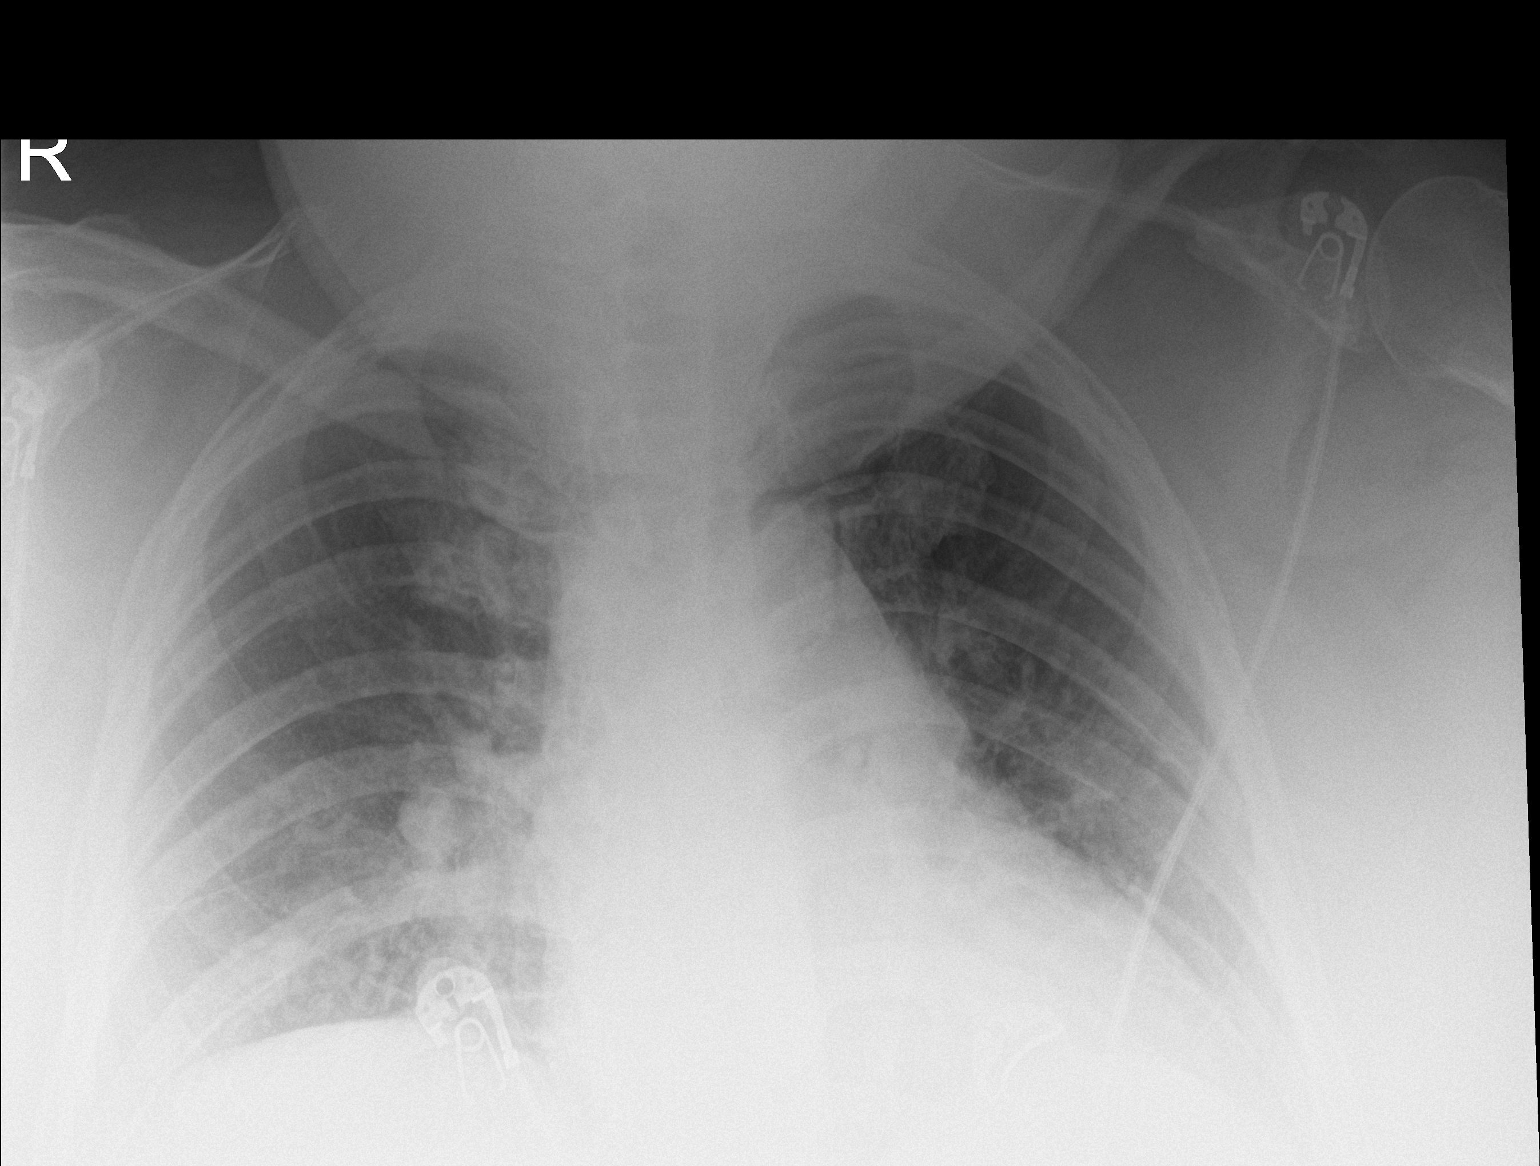

[1 of 1 positions shown; findings below may reference images not displayed]

FINDINGS: Lung volumes are low. Images under penetrated, limiting the
diagnostic sensitivity and specificity of the examination. No
consolidative airspace disease. No pleural effusions. No
pneumothorax. No pulmonary nodule or mass noted. Pulmonary
vasculature and the cardiomediastinal silhouette are within normal
limits.
IMPRESSION: 1. Low lung volumes without radiographic evidence of acute
cardiopulmonary disease.

## 2023-05-21 MED ORDER — HYDROMORPHONE HCL 2 MG PO TABS
1.0000 mg | ORAL_TABLET | Freq: Once | ORAL | Status: AC
  Administered 2023-05-21: 1 mg via ORAL
  Filled 2023-05-21: qty 1

## 2023-05-21 MED ORDER — POTASSIUM CHLORIDE CRYS ER 20 MEQ PO TBCR
40.0000 meq | EXTENDED_RELEASE_TABLET | Freq: Once | ORAL | Status: AC
  Administered 2023-05-21: 40 meq via ORAL
  Filled 2023-05-21: qty 2

## 2023-05-21 MED ORDER — FUROSEMIDE 10 MG/ML IJ SOLN
80.0000 mg | Freq: Two times a day (BID) | INTRAMUSCULAR | Status: DC
  Administered 2023-05-21 – 2023-05-23 (×4): 80 mg via INTRAVENOUS
  Filled 2023-05-21 (×4): qty 8

## 2023-05-21 MED ORDER — POTASSIUM CHLORIDE CRYS ER 20 MEQ PO TBCR
20.0000 meq | EXTENDED_RELEASE_TABLET | Freq: Once | ORAL | Status: AC
  Administered 2023-05-21: 20 meq via ORAL
  Filled 2023-05-21: qty 1

## 2023-05-21 MED ORDER — INSULIN ASPART 100 UNIT/ML IJ SOLN
30.0000 [IU] | Freq: Three times a day (TID) | INTRAMUSCULAR | Status: DC
  Administered 2023-05-21 – 2023-05-22 (×3): 30 [IU] via SUBCUTANEOUS

## 2023-05-21 MED ORDER — INSULIN GLARGINE-YFGN 100 UNIT/ML ~~LOC~~ SOLN
70.0000 [IU] | Freq: Two times a day (BID) | SUBCUTANEOUS | Status: DC
  Administered 2023-05-21 – 2023-05-22 (×3): 70 [IU] via SUBCUTANEOUS
  Filled 2023-05-21 (×4): qty 0.7

## 2023-05-21 NOTE — Assessment & Plan Note (Addendum)
Hospitalized 2x  last month, first for uremia with unclear etiology and then for AKI on CKD 4 which improved with IV fluids and decreasing torsemide and metolazone.  Discharged with plans to follow-up with nephrology outpatient. BUN remains elevated.  Creatinine seems to be improving overall with diuresis. -nephrology consulted, appreciate recs -monitor renal function -Electrolyte disturbances in the setting of CKD and diuresis, continue to monitor and replete as appropriate

## 2023-05-21 NOTE — Assessment & Plan Note (Signed)
Morbidly obese patient on 3-4 L [place] at baseline. Will continue oxygen supplementation and BiPAP nightly -Continue Oxygen supplement -BiPAP nightly 

## 2023-05-21 NOTE — Progress Notes (Addendum)
Patient ID: [name], female   DOB: 1976/11/24, 47 y.o.   MRN: [id number] [facility] Progress Note   Assessment/ Plan:   1. Acute kidney Injury on chronic kidney disease stage IIIb: Secondary to acute exacerbation of diastolic heart failure in the setting of continued cocaine abuse.  Appears to have been responding well to diuresis until the last 24 hours during which urine output appears to have slowed down and BUN/creatinine slightly higher.  I will decrease her furosemide dose to 80 mg IV twice daily today and continue to follow labs/urine output.  We had a brief discussion today regarding her trajectory of renal function and the likelihood of her needing dialysis in the near future especially if she keeps up with cocaine abuse. 2.  Metabolic alkalosis: I am surprised that the abrupt improvement of her metabolic alkalosis overnight in spite of not getting any acetazolamide without worsening of respiratory status/hypercapnic respiratory failure. 3.  Acute exacerbation of diastolic heart failure: Plans noted for right heart catheterization on Monday 6/17 to further evaluate volume status.  Will continue to optimize diuretics until then and reiterated the importance of cocaine cessation. 4.  Hypokalemia: Secondary to ongoing diuresis.  Will monitor status post oral replacement in the setting of ongoing spironolactone use. 5.  Hyperphosphatemia: Secondary to acute kidney injury, will continue to trend phosphorus levels. 6.  Insulin-dependent diabetes mellitus: Significant hyperglycemia noted during current hospitalization; management per primary service.  Subjective:   Reports that breathing is better but feels like her "legs are tight".  Was able to get out of bed into her recliner yesterday.  Denies any chest pain.   Objective:   BP 119/61   Pulse 88   Temp 97.8 F (36.6 C) (Oral)   Resp 15   Ht 5\' 2"  (1.575 m)   Wt (!) 152.6 kg   SpO2 98%   BMI 61.53 kg/m   Intake/Output  Summary (Last 24 hours) at 05/21/2023 0946 Last data filed at 05/21/2023 0400 Gross per 24 hour  Intake 360 ml  Output 1500 ml  Net -1140 ml   Weight change: 2.459 kg  Physical Exam: Gen: Obese woman resting on her side in bed, oxygen via nasal cannula. CVS: Pulse regular rhythm, normal rate, S1 and S2 normal Resp: Clear to auscultation bilaterally Abd: Soft, obese, nontender, bowel sounds normal Ext: Status post left below-knee amputation and right transmetatarsal amputation.  With trace edema right leg  Imaging: No results found.  Labs: BMET Recent Labs  Lab 05/15/23 2351 05/16/23 1422 05/16/23 2212 05/17/23 0236 05/18/23 0119 05/19/23 0130 05/20/23 0207 05/21/23 0143  NA 130*  --  130* 132* 134* 138 136 134*  K 3.8  --  3.6 3.1* 3.7 3.2* 3.5 3.4*  CL 82*  --  82* 84* 83* 83* 86* 85*  CO2 31  --  32 35* 36* 36* 36* 30  GLUCOSE 484* 428* 480* 366* 232* 269* 281* 319*  BUN 157*  --  153* 151* 146* 135* 119* 129*  CREATININE 2.79*  --  2.44* 2.36* 1.77* 1.87* 1.87* 2.02*  CALCIUM 8.8*  --  8.8* 9.0 9.2 9.7 9.5 9.7  PHOS  --   --   --  4.8* 4.1 5.1* 4.7* 5.1*   CBC Recent Labs  Lab 05/15/23 2351 05/17/23 0236 05/18/23 0119 05/19/23 0130  WBC 8.8 8.8 9.4 8.7  HGB 10.6* 9.9* 10.3* 10.6*  HCT 33.3* 31.1* 32.0* 33.0*  MCV 90.7 89.6 89.4 90.9  PLT 333 317 333  344    Medications:     acetaminophen  325 mg Oral Q4H   amLODipine  10 mg Oral Daily   aspirin EC  81 mg Oral Daily   atorvastatin  80 mg Oral Daily   brinzolamide  1 drop Both Eyes TID   And   brimonidine  1 drop Both Eyes TID   enoxaparin (LOVENOX) injection  80 mg Subcutaneous Q24H   furosemide  80 mg Intravenous TID   gabapentin  300 mg Oral QHS   insulin aspart  0-20 Units Subcutaneous TID WC   insulin aspart  30 Units Subcutaneous TID AC   insulin glargine-yfgn  70 Units Subcutaneous BID   latanoprost  1 drop Both Eyes Daily   lidocaine  1 patch Transdermal Q24H   metolazone  5 mg Oral Q  M,W,F   mometasone-formoterol  2 puff Inhalation BID   pantoprazole  40 mg Oral Daily   prednisoLONE acetate  1 drop Both Eyes QID   prochlorperazine  10 mg Oral Once   rOPINIRole  1 mg Oral QHS   spironolactone  25 mg Oral Daily    Zetta Bills, MD 05/21/2023, 9:46 AM

## 2023-05-21 NOTE — Progress Notes (Signed)
Daily Progress Note Intern Pager: [phone]  Patient name: [name] Medical record number: [id number] Date of birth: [birth year] Age: 47 y.o. Gender: female  Primary Care Provider: Latrelle Dodrill, MD Consultants: nephrology, heart failure Code Status: full   Pt Overview and Major Events to Date:  6/9 admitted   Assessment and Plan:   [name] is a 47 y.o. female p/w chest pain in the setting of recent RHC, heart failure, significant kidney disease, diabetes, and OSA.  Hospital Problem List      Hospital     * (Principal) Chest pain     P/w precordial chest pain after RHC on 6/6. ACS workup unremarkable.  Recent cocaine use. -Appreciate HF team recs, diuresis as noted -Plan for right heart cath on Monday 6/17 -cardiac monitoring -ASA, statin        Obstructive sleep apnea (adult) (pediatric)     Morbidly obese patient on 3-4 L [place] at baseline. Will continue oxygen  supplementation and BiPAP nightly -Continue Oxygen supplement -BiPAP nightly        Type 2 diabetes mellitus with hyperglycemia ([name])     Poorly controlled DM with most recent A1c of 13.5 a month ago. Home  meds include Tresiba 70 units twice daily and NovoLog 45u 3 times daily.  Blood glucose stable in 200s overnight. -Semglee 70 units BID, adjust as needed -Novolog 30u TID WC -Resistant SSI with CBGs before every meal and nightly        Azotemia     Hospitalized 2x  last month, first for uremia with unclear etiology and  then for AKI on CKD 4 which improved with IV fluids and decreasing  torsemide and metolazone.  Discharged with plans to follow-up with  nephrology outpatient. BUN remains elevated.  Creatinine seems to be  improving overall with diuresis. -nephrology consulted, appreciate recs -monitor renal function -Electrolyte disturbances in the setting of CKD and diuresis, continue to  monitor and replete as appropriate        Acute on chronic diastolic heart failure ([name])      Last echo 3/24 with EF 55-60%.  Follows with cardiology.  Had RHC on  6/6 showing pulmonary hypertension, RV failure.  Home meds include  torsemide 100 mg in the morning and 50 mg in the evening, metolazone 5 mg  3 days/week.  Difficult to assess volume status.  CXR shows mild vascular  congestion. -8L documented since admit. -Heart Failure team and nephrology consulted, appreciate recs -RHC on 6/17 -diuresis per nephrology -home metolazone -Strict I/os -Daily weights        Precordial chest pain     RVF (right ventricular failure) ([name])    FEN/GI: reg diet (refused carb modified diet) PPx: lovenox Dispo:pending management as above  Subjective:  NAEON, feels all right this morning.  Eating breakfast  Objective: Temp:  [97.8 F (36.6 C)-98.6 F (37 C)] 97.8 F (36.6 C) (06/15 0831) Pulse Rate:  [88-90] 88 (06/15 0330) Resp:  [14-16] 15 (06/15 0831) BP: (119-130)/(61-70) 119/61 (06/15 0847) SpO2:  [97 %-98 %] 98 % (06/15 0330) Weight:  [152.6 kg] 152.6 kg (06/15 0330) Physical Exam: General: NAD sitting on side of bed, pleasant Cardiovascular: RRR on monitor Respiratory: No respiratory distress Abdomen: Soft nontender   Laboratory: Most recent CBC Lab Results  Component Value Date   WBC 8.7 05/19/2023   HGB 10.6 (L) 05/19/2023   HCT 33.0 (L) 05/19/2023   MCV 90.9 05/19/2023   PLT 344 05/19/2023  Most recent BMP    Latest Ref Rng & Units 05/21/2023    1:43 AM  BMP  Glucose 70 - 99 mg/dL 409   BUN 6 - 20 mg/dL 811   Creatinine 9.14 - 1.00 mg/dL 7.82   Sodium 956 - 213 mmol/L 134   Potassium 3.5 - 5.1 mmol/L 3.4   Chloride 98 - 111 mmol/L 85   CO2 22 - 32 mmol/L 30   Calcium 8.9 - 10.3 mg/dL 9.7     Magnesium 2.0   Vonna Drafts, MD 05/21/2023, 11:15 AM  PGY-1, [facility] FPTS Intern pager: [phone], text pages welcome Secure chat group [facility] Teaching Service

## 2023-05-21 NOTE — Progress Notes (Signed)
PT Cancellation Note  Patient Details Name: [name] MRN: [id number] DOB: 08/11/1976   Cancelled Treatment:    Reason Eval/Treat Not Completed: Patient at procedure or test/unavailable. Pt having bedside ultrasound. PT will continue to follow-up with pt acutely as available and appropriate.    Alessandra Bevels Malike Foglio 05/21/2023, 12:25 PM

## 2023-05-21 NOTE — Assessment & Plan Note (Signed)
Last echo 3/24 with EF 55-60%.  Follows with cardiology.  Had RHC on 6/6 showing pulmonary hypertension, RV failure.  Home meds include torsemide 100 mg in the morning and 50 mg in the evening, metolazone 5 mg 3 days/week.  Difficult to assess volume status.  CXR shows mild vascular congestion. -8L documented since admit. -Heart Failure team and nephrology consulted, appreciate recs -RHC on 6/17 -diuresis per nephrology -home metolazone -Strict I/os -Daily weights

## 2023-05-21 NOTE — Progress Notes (Signed)
Drs. Royal Piedra and [name] updated  with Vascular US result  indicating superficial thrombosis.

## 2023-05-21 NOTE — Progress Notes (Addendum)
Re: [name] in [place]. Pt is requesting for stronger pain medication. Pt is in tears. Thanks.  [name] 1O10 pt continues to be in tears, shouting and stating she will not take anymore Medicine until she gets a stronger pain medicine. thanks

## 2023-05-21 NOTE — Progress Notes (Signed)
VASCULAR LAB    Left upper extremity venous duplex has been performed.  See CV proc for preliminary results.   Loye Vento, RVT 05/21/2023, 12:37 PM

## 2023-05-21 NOTE — Evaluation (Signed)
Physical Therapy Evaluation Patient Details Name: [name] MRN: [id number] DOB: [birth year] Today's Date: 05/21/2023  History of Present Illness  Pt is a 47 year old female admitted on 05/14/23 with chest pain after RHC on 6/6. ACS workup unremarkable. Pt with recent cocaine use. Past medical hx of HFpEF/RV failure, OSA, Super morbid obesity, uncontrolled DM, CKD IIIb, cocaine use, non-compliance and left leg BKA/right transmetatarsal.   Clinical Impression  Pt presented sitting upright in recliner chair, awake and willing to participate in therapy session.  Pt very eager to ambulate. Prior to admission, pt reported that she ambulated short distances with use of RW and required assistance with ADLs. Pt stated that she has an aide 7 days/week for 6-8 hrs a day. Pt reported that she lives in a hotel room with her daughter. At the time of evaluation, pt able to complete bed mobility at a mod I level, transfers (x2) from recliner chair with RW and supervision, and ambulated in the hallway with use of RW and L LE prosthesis donned. Pt on 4L of O2 via [place] throughout, which is her baseline. Pt would continue to benefit from skilled physical therapy services at this time while admitted and after d/c to address the below listed limitations in order to improve overall safety and independence with functional mobility.      Recommendations for follow up therapy are one component of a multi-disciplinary discharge planning process, led by the attending physician.  Recommendations may be updated based on patient status, additional functional criteria and insurance authorization.  Follow Up Recommendations       Assistance Recommended at Discharge Intermittent Supervision/Assistance  Patient can return home with the following  A little help with bathing/dressing/bathroom;Assist for transportation    Equipment Recommendations None recommended by PT  Recommendations for Other Services       Functional Status  Assessment Patient has had a recent decline in their functional status and demonstrates the ability to make significant improvements in function in a reasonable and predictable amount of time.     Precautions / Restrictions Precautions Precautions: Fall;Other (comment) Precaution Comments: L BKA, R transmet, watch SpO2 Required Braces or Orthoses: Other Brace Other Brace: L prothesis Restrictions Weight Bearing Restrictions: No LLE Weight Bearing: Touchdown weight bearing      Mobility  Bed Mobility Overal bed mobility: Modified Independent             General bed mobility comments: increased time    Transfers Overall transfer level: Needs assistance Equipment used: Rolling walker (2 wheels) Transfers: Sit to/from Stand, Bed to chair/wheelchair/BSC Sit to Stand: Supervision Stand pivot transfers: Supervision         General transfer comment: supervision for safety with use of RW    Ambulation/Gait Ambulation/Gait assistance: Min guard Gait Distance (Feet): 60 Feet (40' + 20' (with sitting rest break in between)) Assistive device: Rolling walker (2 wheels) Gait Pattern/deviations: Decreased stride length, Decreased step length - right, Decreased step length - left Gait velocity: decreased     General Gait Details: pt overall steady with RW with min guard from PT for safety and line/chair follow management; limited with distance ambulated secondary to fatigue  Stairs            Wheelchair Mobility    Modified Rankin (Stroke Patients Only)       Balance Overall balance assessment: Needs assistance Sitting-balance support: Feet supported Sitting balance-Leahy Scale: Good     Standing balance support: During functional activity, Single extremity supported,  Bilateral upper extremity supported Standing balance-Leahy Scale: Poor                               Pertinent Vitals/Pain Pain Assessment Pain Assessment: No/denies pain Pain  Intervention(s): Monitored during session    Home Living Family/patient expects to be discharged to:: Private residence Living Arrangements: Children Available Help at Discharge: Family;Personal care attendant;Available 24 hours/day;Friend(s) Type of Home: Other(Comment) (Motel) Home Access: Stairs to enter Entrance Stairs-Rails: None Entrance Stairs-Number of Steps: 1   Home Layout: One level Home Equipment: Wheelchair - power;Wheelchair - manual;BSC/3in1;Other (comment);Rolling Walker (2 wheels) Additional Comments: Power wheelchair no longer working (~August 2023). DABSC one arm won't stay up. Slide board    Prior Function Prior Level of Function : Needs assist           ADLs (physical): Bathing;Dressing;Toileting Mobility Comments: Has left prothesis. needs assist with placing liner. ADLs Comments: Medicare transportation; she washes her top half in bed and aide washes bottom half; asisstance for prosthesis donning/doffing     Hand Dominance        Extremity/Trunk Assessment   Upper Extremity Assessment Upper Extremity Assessment: Defer to OT evaluation;Overall WFL for tasks assessed    Lower Extremity Assessment Lower Extremity Assessment: RLE deficits/detail;LLE deficits/detail RLE Deficits / Details: R transmet amputation LLE Deficits / Details: hx of L BKA    Cervical / Trunk Assessment Cervical / Trunk Assessment: Other exceptions Cervical / Trunk Exceptions: significant body habitus  Communication   Communication: No difficulties  Cognition Arousal/Alertness: Awake/alert Behavior During Therapy: WFL for tasks assessed/performed Overall Cognitive Status: Within Functional Limits for tasks assessed                                          General Comments      Exercises     Assessment/Plan    PT Assessment Patient needs continued PT services  PT Problem List Decreased strength;Decreased activity tolerance;Decreased  balance;Decreased mobility;Decreased coordination;Decreased safety awareness;Decreased knowledge of precautions;Cardiopulmonary status limiting activity       PT Treatment Interventions Gait training;DME instruction;Functional mobility training;Stair training;Therapeutic activities;Therapeutic exercise;Balance training;Neuromuscular re-education;Patient/family education    PT Goals (Current goals can be found in the Care Plan section)  Acute Rehab PT Goals Patient Stated Goal: to walk PT Goal Formulation: With patient Time For Goal Achievement: 06/04/23 Potential to Achieve Goals: Good    Frequency Min 2X/week     Co-evaluation               AM-PAC PT "6 Clicks" Mobility  Outcome Measure Help needed turning from your back to your side while in a flat bed without using bedrails?: None Help needed moving from lying on your back to sitting on the side of a flat bed without using bedrails?: None Help needed moving to and from a bed to a chair (including a wheelchair)?: A Little Help needed standing up from a chair using your arms (e.g., wheelchair or bedside chair)?: A Little Help needed to walk in hospital room?: A Little Help needed climbing 3-5 steps with a railing? : A Lot 6 Click Score: 19    End of Session Equipment Utilized During Treatment: Gait belt;Oxygen (4L O2 (same as baseline)) Activity Tolerance: Patient limited by fatigue Patient left: in bed;with call bell/phone within reach Nurse Communication: Mobility status PT Visit Diagnosis:  Other abnormalities of gait and mobility (R26.89)    Time: 1410-1507 PT Time Calculation (min) (ACUTE ONLY): 57 min   Charges:   PT Evaluation $PT Eval Moderate Complexity: 1 Mod PT Treatments $Gait Training: 8-22 mins $Therapeutic Activity: 23-37 mins        Arletta Bale, DPT  Acute Rehabilitation Services Office [phone]   Alessandra Bevels Avon Mergenthaler 05/21/2023, 3:24 PM

## 2023-05-21 NOTE — Assessment & Plan Note (Signed)
Poorly controlled DM with most recent A1c of 13.5 a month ago. Home meds include Tresiba 70 units twice daily and NovoLog 45u 3 times daily. Blood glucose stable in 200s overnight. -Semglee 70 units BID, adjust as needed -Novolog 30u TID WC -Resistant SSI with CBGs before every meal and nightly

## 2023-05-21 NOTE — Progress Notes (Signed)
Rounding Note    Patient Name: [name] Date of Encounter: 05/21/2023  [facility] Cardiologist: Tonny Bollman, MD   Subjective   States breathing is better. Lying in bed   Scheduled Meds:  acetaminophen  325 mg Oral Q4H   amLODipine  10 mg Oral Daily   aspirin EC  81 mg Oral Daily   atorvastatin  80 mg Oral Daily   brinzolamide  1 drop Both Eyes TID   And   brimonidine  1 drop Both Eyes TID   enoxaparin (LOVENOX) injection  80 mg Subcutaneous Q24H   furosemide  80 mg Intravenous BID   gabapentin  300 mg Oral QHS   insulin aspart  0-20 Units Subcutaneous TID WC   insulin aspart  30 Units Subcutaneous TID AC   insulin glargine-yfgn  70 Units Subcutaneous BID   latanoprost  1 drop Both Eyes Daily   lidocaine  1 patch Transdermal Q24H   metolazone  5 mg Oral Q M,W,F   mometasone-formoterol  2 puff Inhalation BID   pantoprazole  40 mg Oral Daily   prednisoLONE acetate  1 drop Both Eyes QID   prochlorperazine  10 mg Oral Once   rOPINIRole  1 mg Oral QHS   spironolactone  25 mg Oral Daily   Continuous Infusions:   PRN Meds: albuterol, diclofenac Sodium, ipratropium-albuterol, nitroGLYCERIN, mouth rinse, polyethylene glycol   Vital Signs    Vitals:   05/20/23 2021 05/21/23 0330 05/21/23 0831 05/21/23 0847  BP: 121/63 130/70  119/61  Pulse: 90 88    Resp: 16 14 15    Temp: 98.6 F (37 C) 97.8 F (36.6 C) 97.8 F (36.6 C)   TempSrc: Oral Oral Oral   SpO2: 97% 98%    Weight:  (!) 152.6 kg    Height:        Intake/Output Summary (Last 24 hours) at 05/21/2023 1243 Last data filed at 05/21/2023 0400 Gross per 24 hour  Intake 360 ml  Output 1500 ml  Net -1140 ml      05/21/2023    3:30 AM 05/20/2023    6:13 AM 05/19/2023    5:45 AM  Last 3 Weights  Weight (lbs) 336 lb 6.8 oz 331 lb 340 lb 2.7 oz  Weight (kg) 152.6 kg 150.141 kg 154.3 kg      Telemetry    Normal sinus rhythm heart rate 80s. - Personally Reviewed  ECG    No new tracings-  Personally Reviewed  Physical Exam  Patient weighs 330 pounds.  Fluid status hard to determine.  GEN: Morbidly obese Neck: Not able to assess due to excessive body habitus  cardiac: RRR, no murmurs, rubs, or gallops.  Respiratory: Clear to auscultation bilaterally. GI: Soft, nontender, non-distended  MS: left BKA, right transmetatarsal ampuation.  Neuro:  Nonfocal  Psych: Normal affect   Labs    High Sensitivity Troponin:   Recent Labs  Lab 05/15/23 2351 05/16/23 2044 05/16/23 2212 05/18/23 0458 05/18/23 0635  TROPONINIHS 39* 36* 38* 40* 43*     Chemistry Recent Labs  Lab 05/19/23 0130 05/20/23 0207 05/21/23 0143  NA 138 136 134*  K 3.2* 3.5 3.4*  CL 83* 86* 85*  CO2 36* 36* 30  GLUCOSE 269* 281* 319*  BUN 135* 119* 129*  CREATININE 1.87* 1.87* 2.02*  CALCIUM 9.7 9.5 9.7  MG 2.0 2.0 2.0  ALBUMIN 2.9* 2.9* 3.0*  GFRNONAA 33* 33* 30*  ANIONGAP 19* 14 19*    Lipids No  results for input(s): "CHOL", "TRIG", "HDL", "LABVLDL", "LDLCALC", "CHOLHDL" in the last 168 hours.  Hematology Recent Labs  Lab 05/17/23 0236 05/18/23 0119 05/19/23 0130  WBC 8.8 9.4 8.7  RBC 3.47* 3.58* 3.63*  HGB 9.9* 10.3* 10.6*  HCT 31.1* 32.0* 33.0*  MCV 89.6 89.4 90.9  MCH 28.5 28.8 29.2  MCHC 31.8 32.2 32.1  RDW 16.5* 16.4* 16.8*  PLT 317 333 344   Thyroid No results for input(s): "TSH", "FREET4" in the last 168 hours.  BNP Recent Labs  Lab 05/14/23 2240  BNP 19.3    DDimer No results for input(s): "DDIMER" in the last 168 hours.   Radiology    No results found.  Cardiac Studies   RHC 05/12/23: 1. Elevated right > left heart filling pressures.  2. Mixed pulmonary venous/pulmonary arterial hypertension.  3. Low cardiac output and low PAPi, suspect primarily RV failure.    Suspect diastolic CHF, pulmonary hypertension due to elevated LA pressure as well as OHS/OSA. Significant RV failure.  Will increase torsemide to 100 qam/50 qpm and add KCl 20 daily.  I worry that she  will need HD soon based on renal dysfunction.   Echocardiogram 02/22/2023  1. Left ventricular ejection fraction, by estimation, is 55 to 60%. The  left ventricle has normal function. Left ventricular endocardial border  not optimally defined to evaluate regional wall motion. Left ventricular  diastolic parameters are consistent  with Grade II diastolic dysfunction (pseudonormalization).   2. Right ventricular systolic function was not well visualized. The right  ventricular size is not well visualized. Tricuspid regurgitation signal is  inadequate for assessing PA pressure.   3. The mitral valve was not well visualized. No evidence of mitral valve  regurgitation.   4. The aortic valve was not well visualized. Aortic valve regurgitation  is not visualized. No aortic stenosis is present.   5. The inferior vena cava is dilated in size with <50% respiratory  variability, suggesting right atrial pressure of 15 mmHg.   6. Technically difficult study with very poor acoustic windows. The right  side of the heart was minimally visualized.   Patient Profile     47 y.o. female  hx of HFpEF/RV failure, OSA, Super morbid obesity BMI 61, uncontrolled DM, CKD IIIb, cocaine use, non-compliance and left leg BKA/right transmetatarsal who is being seen for CHF exacerbation, pending RHC  Assessment & Plan    Acute on chronic HFpEF RV failure Right heart catheterization on 05/12/2023 showed primarily right-sided heart failure.  Echocardiogram showing LVEF 55 to 60% with grade 2 diastolic dysfunction.  Elevated right atrial pressure of 15.  BNP 19.3 but given excessive body habitus not a great representation.  She has been receiving aggressive diuresis with nephrology's help.  Renal function has improved on IV diuretics.  Creatinine 1.87, baseline difficult to determine.  Appears to be around 1.5 prior to admission however has been in the mid twos during this admission.   Difficult to assess volume status on  exam.  Plan to perform right heart catheterization on Monday to evaluate filling pressures.  Patient amenable to plan. S/p acetazolamide 250,  can continue IV Lasix  (home dose-Lasix 80 mg 3 times daily metolazone 5 mg Monday Wednesday Friday) Continue spironolactone 25 mg, per nephro Not a candidate for SGLT2 inhibitor due to uncontrolled diabetes and significant concern for yeast infections. PT to evaluate and treat now that she has her prosthetic leg back. Urine output has not been documented accurately at night.  For  RHC Risks and benefits of cardiac catheterization have been discussed with the patient.  These include bleeding, infection, kidney damage, stroke, heart attack, death.  The patient understands these risks and is willing to proceed.   Nonobstructive CAD Atypical chest pain EKG showing no acute ST-T wave changes.  Troponins mildly elevated but flat in the 40s.  Left heart catheterization in December 2021 showed mild diffuse disease throughout the LAD.  Continues to have intermittent complaints of chest pain that last for couple minutes.  Not likely to be related to ischemic disease. Continue aspirin and statin.  LDL 174 7 months ago. PTA Rosuvastatin 10 mg was switched to atorvastatin 80mg  with anticipation of worsening renal function and aggressive secondary prevention. Recheck LDL outpatient.   AKI on CKD III. Baseline creatinine appears to be in the mid twos.  Prior to admission was closer to 1.5.  Nephrology following.  Renal function has 1.87 , now 2.02. Will continue diuresis until RHC on Mon  Uncontrolled diabetes mellitus A1c 13.1%   For questions or updates, please contact [facility] Please consult [email or web address] for contact info under        Signed, Maisie Fus, MD  05/21/2023, 12:43 PM

## 2023-05-21 NOTE — Assessment & Plan Note (Signed)
P/w precordial chest pain after RHC on 6/6. ACS workup unremarkable. Recent cocaine use. -Appreciate HF team recs, diuresis as noted -Plan for right heart cath on Monday 6/17 -cardiac monitoring -ASA, statin

## 2023-05-21 NOTE — Progress Notes (Addendum)
[employer] [facility] called and they have requested for the [place] order changed to [facility] de[facility] to r/o Thrombophlebitis. Thanks    Also [name] CBG at 414. just taken. Thanks

## 2023-05-22 DIAGNOSIS — I5033 Acute on chronic diastolic (congestive) heart failure: Secondary | ICD-10-CM | POA: Diagnosis not present

## 2023-05-22 DIAGNOSIS — I50810 Right heart failure, unspecified: Secondary | ICD-10-CM | POA: Diagnosis not present

## 2023-05-22 DIAGNOSIS — R7989 Other specified abnormal findings of blood chemistry: Secondary | ICD-10-CM | POA: Diagnosis not present

## 2023-05-22 LAB — RENAL FUNCTION PANEL
Albumin: 3 g/dL — ABNORMAL LOW (ref 3.5–5.0)
Anion gap: 16 — ABNORMAL HIGH (ref 5–15)
BUN: 124 mg/dL — ABNORMAL HIGH (ref 6–20)
CO2: 27 mmol/L (ref 22–32)
Calcium: 9.1 mg/dL (ref 8.9–10.3)
Chloride: 88 mmol/L — ABNORMAL LOW (ref 98–111)
Creatinine, Ser: 1.89 mg/dL — ABNORMAL HIGH (ref 0.44–1.00)
GFR, Estimated: 33 mL/min — ABNORMAL LOW (ref >60)
Glucose, Bld: 337 mg/dL — ABNORMAL HIGH (ref 70–99)
Phosphorus: 4.5 mg/dL (ref 2.5–4.6)
Potassium: 3.4 mmol/L — ABNORMAL LOW (ref 3.5–5.1)
Sodium: 131 mmol/L — ABNORMAL LOW (ref 135–145)

## 2023-05-22 LAB — CBC
HCT: 32.7 % — ABNORMAL LOW (ref 36.0–46.0)
Hemoglobin: 10.8 g/dL — ABNORMAL LOW (ref 12.0–15.0)
MCH: 30.2 pg (ref 26.0–34.0)
MCHC: 33 g/dL (ref 30.0–36.0)
MCV: 91.3 fL (ref 80.0–100.0)
Platelets: 296 10*3/uL (ref 150–400)
RBC: 3.58 MIL/uL — ABNORMAL LOW (ref 3.87–5.11)
RDW: 16.3 % — ABNORMAL HIGH (ref 11.5–15.5)
WBC: 11.6 10*3/uL — ABNORMAL HIGH (ref 4.0–10.5)
nRBC: 0 % (ref 0.0–0.2)

## 2023-05-22 LAB — MAGNESIUM: Magnesium: 1.8 mg/dL (ref 1.7–2.4)

## 2023-05-22 LAB — GLUCOSE, CAPILLARY
Glucose-Capillary: 300 mg/dL — ABNORMAL HIGH (ref 70–99)
Glucose-Capillary: 468 mg/dL — ABNORMAL HIGH (ref 70–99)
Glucose-Capillary: 414 mg/dL — ABNORMAL HIGH (ref 70–99)
Glucose-Capillary: 434 mg/dL — ABNORMAL HIGH (ref 70–99)

## 2023-05-22 MED ORDER — INSULIN ASPART 100 UNIT/ML IJ SOLN
35.0000 [IU] | Freq: Three times a day (TID) | INTRAMUSCULAR | Status: DC
  Administered 2023-05-22 – 2023-05-24 (×6): 35 [IU] via SUBCUTANEOUS

## 2023-05-22 MED ORDER — SODIUM CHLORIDE 0.9 % IV SOLN: INTRAVENOUS | Status: DC

## 2023-05-22 MED ORDER — INSULIN GLARGINE-YFGN 100 UNIT/ML ~~LOC~~ SOLN
50.0000 [IU] | Freq: Every day | SUBCUTANEOUS | Status: AC
  Administered 2023-05-23: 50 [IU] via SUBCUTANEOUS
  Filled 2023-05-22: qty 0.5

## 2023-05-22 MED ORDER — POTASSIUM CHLORIDE CRYS ER 20 MEQ PO TBCR
40.0000 meq | EXTENDED_RELEASE_TABLET | Freq: Once | ORAL | Status: AC
  Administered 2023-05-22: 40 meq via ORAL
  Filled 2023-05-22: qty 2

## 2023-05-22 MED ORDER — HYDROMORPHONE HCL 2 MG PO TABS
1.0000 mg | ORAL_TABLET | Freq: Once | ORAL | Status: AC | PRN
  Administered 2023-05-22: 1 mg via ORAL
  Filled 2023-05-22: qty 1

## 2023-05-22 MED ORDER — INSULIN ASPART 100 UNIT/ML IJ SOLN
20.0000 [IU] | Freq: Once | INTRAMUSCULAR | Status: AC
  Administered 2023-05-22: 20 [IU] via SUBCUTANEOUS

## 2023-05-22 MED ORDER — INSULIN GLARGINE-YFGN 100 UNIT/ML ~~LOC~~ SOLN
80.0000 [IU] | Freq: Two times a day (BID) | SUBCUTANEOUS | Status: AC
  Administered 2023-05-22: 80 [IU] via SUBCUTANEOUS
  Filled 2023-05-22: qty 0.8

## 2023-05-22 MED ORDER — INSULIN GLARGINE-YFGN 100 UNIT/ML ~~LOC~~ SOLN
70.0000 [IU] | Freq: Two times a day (BID) | SUBCUTANEOUS | Status: DC
  Filled 2023-05-22: qty 0.7

## 2023-05-22 MED ORDER — INSULIN ASPART 100 UNIT/ML IJ SOLN: 35.0000 [IU] | Freq: Three times a day (TID) | INTRAMUSCULAR | Status: DC

## 2023-05-22 NOTE — Assessment & Plan Note (Addendum)
Poorly controlled DM with most recent A1c of 13.5 a month ago. Home meds include Tresiba 70 units twice daily and NovoLog 45u 3 times daily. Blood glucose as high as 400's yesterday, and received 186 units SAI. Patient NPO at midnight for RHC. Will adjust insuline reg as needed.  -Semglee 70 units this am, consider 80 units x 1 tonight if CBG's 400's. -Semglee 55 units tomorrow am, 70-80 units in evening after RHC -Novolog 35u TID WC -Resistant SSI with CBGs before every meal and nightly

## 2023-05-22 NOTE — Progress Notes (Signed)
Pt placed self on cpap.  

## 2023-05-22 NOTE — Progress Notes (Addendum)
Rounding Note    Patient Name: [name] Date of Encounter: 05/22/2023  [facility] Cardiologist: Tonny Bollman, MD   Subjective   Slept well. Feels her right leg is less tight   Scheduled Meds:  acetaminophen  325 mg Oral Q4H   amLODipine  10 mg Oral Daily   aspirin EC  81 mg Oral Daily   atorvastatin  80 mg Oral Daily   brinzolamide  1 drop Both Eyes TID   And   brimonidine  1 drop Both Eyes TID   enoxaparin (LOVENOX) injection  80 mg Subcutaneous Q24H   furosemide  80 mg Intravenous BID   gabapentin  300 mg Oral QHS   insulin aspart  0-20 Units Subcutaneous TID WC   insulin aspart  30 Units Subcutaneous TID AC   insulin glargine-yfgn  70 Units Subcutaneous BID   latanoprost  1 drop Both Eyes Daily   lidocaine  1 patch Transdermal Q24H   metolazone  5 mg Oral Q M,W,F   mometasone-formoterol  2 puff Inhalation BID   pantoprazole  40 mg Oral Daily   potassium chloride  40 mEq Oral Once   prednisoLONE acetate  1 drop Both Eyes QID   prochlorperazine  10 mg Oral Once   rOPINIRole  1 mg Oral QHS   spironolactone  25 mg Oral Daily   Continuous Infusions:   PRN Meds: albuterol, diclofenac Sodium, ipratropium-albuterol, nitroGLYCERIN, mouth rinse, polyethylene glycol   Vital Signs    Vitals:   05/22/23 0047 05/22/23 0300 05/22/23 0755 05/22/23 0810  BP:  139/82 (!) 142/98   Pulse:  86 98   Resp: 16 16 13 16   Temp:  98.1 F (36.7 C) 98.5 F (36.9 C)   TempSrc:  Axillary Oral   SpO2:  97% 97%   Weight:  (!) 154.6 kg    Height:        Intake/Output Summary (Last 24 hours) at 05/22/2023 0917 Last data filed at 05/21/2023 2100 Gross per 24 hour  Intake --  Output 800 ml  Net -800 ml      05/22/2023    3:00 AM 05/21/2023    3:30 AM 05/20/2023    6:13 AM  Last 3 Weights  Weight (lbs) 340 lb 13.3 oz 336 lb 6.8 oz 331 lb  Weight (kg) 154.6 kg 152.6 kg 150.141 kg      Telemetry    Sinus, sinus tachycardia - Personally Reviewed  ECG    No  new tracings- Personally Reviewed  Physical Exam  Patient weighs 330 pounds.  Fluid status hard to determine.  GEN: Morbidly obese Neck: Not able to assess due to excessive body habitus  cardiac: RRR, no murmurs, rubs, or gallops.  Respiratory: Clear to auscultation bilaterally. GI: Soft, nontender, non-distended  MS: left BKA, right transmetatarsal ampuation.  Neuro:  Nonfocal  Psych: Normal affect   Labs    High Sensitivity Troponin:   Recent Labs  Lab 05/15/23 2351 05/16/23 2044 05/16/23 2212 05/18/23 0458 05/18/23 0635  TROPONINIHS 39* 36* 38* 40* 43*     Chemistry Recent Labs  Lab 05/20/23 0207 05/21/23 0143 05/22/23 0227  NA 136 134* 131*  K 3.5 3.4* 3.4*  CL 86* 85* 88*  CO2 36* 30 27  GLUCOSE 281* 319* 337*  BUN 119* 129* 124*  CREATININE 1.87* 2.02* 1.89*  CALCIUM 9.5 9.7 9.1  MG 2.0 2.0 1.8  ALBUMIN 2.9* 3.0* 3.0*  GFRNONAA 33* 30* 33*  ANIONGAP 14 19* 16*  Lipids No results for input(s): "CHOL", "TRIG", "HDL", "LABVLDL", "LDLCALC", "CHOLHDL" in the last 168 hours.  Hematology Recent Labs  Lab 05/18/23 0119 05/19/23 0130 05/22/23 0227  WBC 9.4 8.7 11.6*  RBC 3.58* 3.63* 3.58*  HGB 10.3* 10.6* 10.8*  HCT 32.0* 33.0* 32.7*  MCV 89.4 90.9 91.3  MCH 28.8 29.2 30.2  MCHC 32.2 32.1 33.0  RDW 16.4* 16.8* 16.3*  PLT 333 344 296   Thyroid No results for input(s): "TSH", "FREET4" in the last 168 hours.  BNP No results for input(s): "BNP", "PROBNP" in the last 168 hours.   DDimer No results for input(s): "DDIMER" in the last 168 hours.   Radiology    VAS [place] UPPER EXTREMITY VENOUS DUPLEX  Result Date: 05/21/2023 UPPER VENOUS STUDY  Patient Name:  [name]  Date of Exam:   05/21/2023 Medical Rec #: [id number]     Accession #:    [id number] Date of Birth: 1976/10/10      Patient Gender: F Patient Age:   47 years Exam Location:  [facility] Procedure:      VAS [place] UPPER EXTREMITY VENOUS DUPLEX Referring Phys: MARGARET PRAY  --------------------------------------------------------------------------------  Indications: Palpable Cord, and Pain Limitations: Body habitus (BMI 61.53) and Patient unable remain on back. Comparison Study: No prior study Performing Technologist: Sherren Kerns RVS  Examination Guidelines: A complete evaluation includes B-mode imaging, spectral Doppler, color Doppler, and power Doppler as needed of all accessible portions of each vessel. Bilateral testing is considered an integral part of a complete examination. Limited examinations for reoccurring indications may be performed as noted.  Left Findings: +----------+------------+---------+-----------+----------+---------------------+ LEFT      CompressiblePhasicitySpontaneousProperties       Summary        +----------+------------+---------+-----------+----------+---------------------+ IJV                                                    Not visualized     +----------+------------+---------+-----------+----------+---------------------+ Subclavian               Yes       Yes                                    +----------+------------+---------+-----------+----------+---------------------+ Axillary                 Yes       Yes                                    +----------+------------+---------+-----------+----------+---------------------+ Brachial      Full                                                        +----------+------------+---------+-----------+----------+---------------------+ Radial                                                 Not visualized     +----------+------------+---------+-----------+----------+---------------------+ Ulnar  Not visualized     +----------+------------+---------+-----------+----------+---------------------+ Cephalic      None                                   Acute in forearm at                                                         site of pain and                                                              knotting        +----------+------------+---------+-----------+----------+---------------------+ Basilic                                                Not visualized     +----------+------------+---------+-----------+----------+---------------------+  Summary:  Left: No evidence of deep vein thrombosis in the visualized veins of the upper extremity. Findings consistent with acute superficial vein thrombosis involving the left cephalic vein.  *See table(s) above for measurements and observations.  Diagnosing physician: Heath Lark Electronically signed by Heath Lark on 05/21/2023 at 6:09:29 PM.    Final     Cardiac Studies   RHC 05/12/23: 1. Elevated right > left heart filling pressures.  2. Mixed pulmonary venous/pulmonary arterial hypertension.  3. Low cardiac output and low PAPi, suspect primarily RV failure.    Suspect diastolic CHF, pulmonary hypertension due to elevated LA pressure as well as OHS/OSA. Significant RV failure.  Will increase torsemide to 100 qam/50 qpm and add KCl 20 daily.  I worry that she will need HD soon based on renal dysfunction.   Echocardiogram 02/22/2023  1. Left ventricular ejection fraction, by estimation, is 55 to 60%. The  left ventricle has normal function. Left ventricular endocardial border  not optimally defined to evaluate regional wall motion. Left ventricular  diastolic parameters are consistent  with Grade II diastolic dysfunction (pseudonormalization).   2. Right ventricular systolic function was not well visualized. The right  ventricular size is not well visualized. Tricuspid regurgitation signal is  inadequate for assessing PA pressure.   3. The mitral valve was not well visualized. No evidence of mitral valve  regurgitation.   4. The aortic valve was not well visualized. Aortic valve regurgitation  is not visualized. No aortic stenosis  is present.   5. The inferior vena cava is dilated in size with <50% respiratory  variability, suggesting right atrial pressure of 15 mmHg.   6. Technically difficult study with very poor acoustic windows. The right  side of the heart was minimally visualized.   Patient Profile     47 y.o. female  hx of HFpEF/RV failure, OSA, Super morbid obesity BMI 61, uncontrolled DM, CKD IIIb, cocaine use, non-compliance and left leg BKA/right transmetatarsal who is being seen for CHF exacerbation, pending RHC  Assessment & Plan    Acute on chronic HFpEF RV failure Right heart catheterization  on 05/12/2023 showed primarily right-sided heart failure.  Echocardiogram showing LVEF 55 to 60% with grade 2 diastolic dysfunction.  Elevated right atrial pressure of 15.  BNP 19.3 but given excessive body habitus not a great representation.  She has been receiving aggressive diuresis with nephrology's help.  Renal function has improved on IV diuretics.  Crt is stable 1.8-2.0  Difficult to assess volume status on exam.  Plan to perform right heart catheterization on Monday to evaluate filling pressures.  Patient amenable to plan. Net negative 800 24 hrs, total 8L. Weights have not changed. 340.  can continue IV Lasix, K>4. Mg>2  (home dose-Lasix 80 mg 3 times daily metolazone 5 mg Monday Wednesday Friday) Continue spironolactone 25 mg, per nephro Not a candidate for SGLT2 inhibitor due to uncontrolled diabetes and significant concern for yeast infections. S/p acetazolamide 250,  For RHC Risks and benefits of cardiac catheterization have been discussed with the patient.  These include bleeding, infection, kidney damage, stroke, heart attack, death.  The patient understands these risks and is willing to proceed.  Stable chronic issues Nonobstructive CAD Atypical chest pain EKG showing no acute ST-T wave changes.  Troponins mildly elevated but flat in the 40s.  Left heart catheterization in December 2021 showed mild  diffuse disease throughout the LAD.  Continues to have intermittent complaints of chest pain that last for couple minutes.  Not likely to be related to ischemic disease. Continue aspirin and statin.  LDL 174 7 months ago. PTA Rosuvastatin 10 mg was switched to atorvastatin 80mg  with anticipation of worsening renal function and aggressive secondary prevention. Recheck LDL outpatient.   AKI on CKD III. Baseline creatinine appears to be in the mid twos.  Prior to admission was closer to 1.5.  Nephrology following. Renal fxn stable 1.8-2.0. Will continue diuresis until RHC on Mon  Uncontrolled diabetes mellitus A1c 13.1%   For questions or updates, please contact [facility] Please consult [email or web address] for contact info under        Signed, Maisie Fus, MD  05/22/2023, 9:17 AM

## 2023-05-22 NOTE — Progress Notes (Signed)
Patient ID: [name], female   DOB: 09/11/1976, 47 y.o.   MRN: [id number] [facility] Progress Note   Assessment/ Plan:   1. Acute kidney Injury on chronic kidney disease stage IIIb: Secondary to acute exacerbation of diastolic heart failure in the setting of continued cocaine abuse.  Urine output possibly decreased versus inaccurately charted overnight after furosemide decreased to 80 mg IV twice daily because of rising BUN/creatinine.  She remains on metolazone 3 times a week and was recently started on spironolactone 25 mg daily.  She does not have any acute indications for dialysis. 2.  Metabolic alkalosis: Appears largely from CO2 retention with her habitus/OHS and exacerbated by diuretics.  Currently improving after earlier refractoriness to acetazolamide administration. 3.  Acute exacerbation of diastolic heart failure: Plans noted for right heart catheterization on Monday 6/17 to further evaluate volume status.  Reeducated on the importance of cocaine cessation. 4.  Hypokalemia: Secondary to ongoing diuresis.  Will monitor status post oral replacement in the setting of ongoing spironolactone use. 5.  Hyperphosphatemia: Secondary to acute kidney injury, will continue to trend phosphorus levels. 6.  Insulin-dependent diabetes mellitus: Significant hyperglycemia noted during current hospitalization; management per primary service.  Subjective:   Denies any acute events overnight and feels that right leg swelling is improved.   Objective:   BP (!) 142/98 (BP Location: Right Leg)   Pulse 98   Temp 98.5 F (36.9 C) (Oral)   Resp 16   Ht 5\' 2"  (1.575 m)   Wt (!) 154.6 kg   SpO2 97%   BMI 62.34 kg/m   Intake/Output Summary (Last 24 hours) at 05/22/2023 4098 Last data filed at 05/21/2023 2100 Gross per 24 hour  Intake --  Output 800 ml  Net -800 ml   Weight change: 2 kg  Physical Exam: Gen: Morbidly obese woman who appears comfortable sitting up on the side of her  bed taking medications.  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  Right leg trace pitting edema.  Imaging: VAS [place] UPPER EXTREMITY VENOUS DUPLEX  Result Date: 05/21/2023 UPPER VENOUS STUDY  Patient Name:  [name]  Date of Exam:   05/21/2023 Medical Rec #: [id number]     Accession #:    [id number] Date of Birth: 1976/02/20      Patient Gender: F Patient Age:   60 years Exam Location:  [facility] Procedure:      VAS [place] UPPER EXTREMITY VENOUS DUPLEX Referring Phys: MARGARET PRAY --------------------------------------------------------------------------------  Indications: Palpable Cord, and Pain Limitations: Body habitus (BMI 61.53) and Patient unable remain on back. Comparison Study: No prior study Performing Technologist: Sherren Kerns RVS  Examination Guidelines: A complete evaluation includes B-mode imaging, spectral Doppler, color Doppler, and power Doppler as needed of all accessible portions of each vessel. Bilateral testing is considered an integral part of a complete examination. Limited examinations for reoccurring indications may be performed as noted.  Left Findings: +----------+------------+---------+-----------+----------+---------------------+ LEFT      CompressiblePhasicitySpontaneousProperties       Summary        +----------+------------+---------+-----------+----------+---------------------+ IJV  Not visualized     +----------+------------+---------+-----------+----------+---------------------+ Subclavian               Yes       Yes                                    +----------+------------+---------+-----------+----------+---------------------+ Axillary                  Yes       Yes                                    +----------+------------+---------+-----------+----------+---------------------+ Brachial      Full                                                        +----------+------------+---------+-----------+----------+---------------------+ Radial                                                 Not visualized     +----------+------------+---------+-----------+----------+---------------------+ Ulnar                                                  Not visualized     +----------+------------+---------+-----------+----------+---------------------+ Cephalic      None                                   Acute in forearm at                                                        site of pain and                                                              knotting        +----------+------------+---------+-----------+----------+---------------------+ Basilic                                                Not visualized     +----------+------------+---------+-----------+----------+---------------------+  Summary:  Left: No evidence of deep vein thrombosis in the visualized veins of the upper extremity. Findings consistent with acute superficial vein thrombosis involving the left cephalic vein.  *See table(s) above for measurements and observations.  Diagnosing physician: Heath Lark Electronically signed by Heath Lark on 05/21/2023 at 6:09:29 PM.    Final     Labs: BMET Recent Labs  Lab  05/16/23 2212 05/17/23 0236 05/18/23 0119 05/19/23 0130 05/20/23 0207 05/21/23 0143 05/22/23 0227  NA 130* 132* 134* 138 136 134* 131*  K 3.6 3.1* 3.7 3.2* 3.5 3.4* 3.4*  CL 82* 84* 83* 83* 86* 85* 88*  CO2 32 35* 36* 36* 36* 30 27  GLUCOSE 480* 366* 232* 269* 281* 319* 337*  BUN 153* 151* 146* 135* 119* 129* 124*  CREATININE 2.44* 2.36* 1.77* 1.87* 1.87* 2.02* 1.89*  CALCIUM 8.8* 9.0 9.2 9.7 9.5 9.7 9.1  PHOS  --   4.8* 4.1 5.1* 4.7* 5.1* 4.5   CBC Recent Labs  Lab 05/17/23 0236 05/18/23 0119 05/19/23 0130 05/22/23 0227  WBC 8.8 9.4 8.7 11.6*  HGB 9.9* 10.3* 10.6* 10.8*  HCT 31.1* 32.0* 33.0* 32.7*  MCV 89.6 89.4 90.9 91.3  PLT 317 333 344 296    Medications:     acetaminophen  325 mg Oral Q4H   amLODipine  10 mg Oral Daily   aspirin EC  81 mg Oral Daily   atorvastatin  80 mg Oral Daily   brinzolamide  1 drop Both Eyes TID   And   brimonidine  1 drop Both Eyes TID   enoxaparin (LOVENOX) injection  80 mg Subcutaneous Q24H   furosemide  80 mg Intravenous BID   gabapentin  300 mg Oral QHS   insulin aspart  0-20 Units Subcutaneous TID WC   insulin aspart  30 Units Subcutaneous TID AC   insulin glargine-yfgn  70 Units Subcutaneous BID   latanoprost  1 drop Both Eyes Daily   lidocaine  1 patch Transdermal Q24H   metolazone  5 mg Oral Q M,W,F   mometasone-formoterol  2 puff Inhalation BID   pantoprazole  40 mg Oral Daily   prednisoLONE acetate  1 drop Both Eyes QID   prochlorperazine  10 mg Oral Once   rOPINIRole  1 mg Oral QHS   spironolactone  25 mg Oral Daily    Zetta Bills, MD 05/22/2023, 9:53 AM

## 2023-05-22 NOTE — Progress Notes (Addendum)
Provider on call notified of CBG [phone] via secure chat.

## 2023-05-22 NOTE — Assessment & Plan Note (Addendum)
Hospitalized 2x  last month, first for uremia with unclear etiology and then for AKI on CKD 4 which improved with IV fluids and decreasing torsemide and metolazone.  Discharged with plans to follow-up with nephrology outpatient. BUN remains elevated.  Creatinine seems to be improving overall with diuresis decrease from TID to BID. -nephrology consulted, appreciate recs -monitor renal function -Electrolyte disturbances in the setting of CKD and diuresis, continue to monitor and replete as appropriate

## 2023-05-22 NOTE — Progress Notes (Signed)
Daily Progress Note Intern Pager: [phone]  Patient name: [name] Medical record number: [id number] Date of birth: 1976-09-13 Age: 47 y.o. Gender: female  Primary Care Provider: Latrelle Dodrill, MD Consultants: Nephrology, Heart Failure Code Status: Full Code  Pt Overview and Major Events to Date:  6/9 - admitted  Assessment and Plan:  [name] is a 47 y.o. female p/w chest pain in the setting of recent RHC, heart failure, significant kidney disease, diabetes, and OSA.   Hospital Problem List      Hospital     * (Principal) Chest pain     P/w precordial chest pain after RHC on 6/6. ACS workup unremarkable.  Recent cocaine use may be cause of symptoms. Cardiology plan for [facility]  Monday 6/17. -Appreciate HF team recs, diuresis as noted -Plan for right heart cath on Monday 6/17 -cardiac monitoring -ASA, statin        Obstructive sleep apnea (adult) (pediatric)     Morbidly obese patient on 3-4 L [place] at baseline. Will continue oxygen  supplementation and BiPAP nightly -Continue Oxygen supplement -BiPAP nightly        Type 2 diabetes mellitus with hyperglycemia ([name])     Poorly controlled DM with most recent A1c of 13.5 a month ago. Home  meds include Tresiba 70 units twice daily and NovoLog 45u 3 times daily.  Blood glucose as high as 400's yesterday, and received 186 units SAI.  Patient NPO at midnight for RHC. Will adjust insuline reg as needed.  -Semglee 70 units this am, consider 80 units x 1 tonight if CBG's 400's. -Semglee 55 units tomorrow am, 70-80 units in evening after RHC -Novolog 35u TID WC -Resistant SSI with CBGs before every meal and nightly        Azotemia     Hospitalized 2x  last month, first for uremia with unclear etiology and  then for AKI on CKD 4 which improved with IV fluids and decreasing  torsemide and metolazone.  Discharged with plans to follow-up with  nephrology outpatient. BUN remains elevated.  Creatinine seems to be   improving overall with diuresis decrease from TID to BID. -nephrology consulted, appreciate recs -monitor renal function -Electrolyte disturbances in the setting of CKD and diuresis, continue to  monitor and replete as appropriate        Acute on chronic diastolic heart failure ([name])     Last echo 3/24 with EF 55-60%.  Follows with cardiology.  Had RHC on  6/6 showing pulmonary hypertension, RV failure.  Home meds include  torsemide 100 mg in the morning and 50 mg in the evening, metolazone 5 mg  3 days/week.  Difficult to assess volume status.  CXR shows mild vascular  congestion. -8.9L documented since admit. Plan for [facility] Monday 6/17. -Heart Failure team and nephrology consulted, appreciate recs -RHC on 6/17 -diuresis per nephrology -home metolazone -Strict I/os -Daily weights        Precordial chest pain     RVF (right ventricular failure) ([name])    FEN/GI: Regular Diet PPx: Lovenox Dispo: Pending above management    Subjective:  Doing well this morning, no complaints, feels like her legs are doing better from a fluid perspective.   Objective: Temp:  [97.8 F (36.6 C)-98.5 F (36.9 C)] 98.5 F (36.9 C) (06/16 0755) Pulse Rate:  [86-98] 98 (06/16 0755) Resp:  [13-16] 16 (06/16 0810) BP: (97-142)/(55-98) 142/98 (06/16 0755) SpO2:  [97 %] 97 % (06/16 0755) FiO2 (%):  [  32 %] 32 % (06/15 2009) Weight:  [154.6 kg] 154.6 kg (06/16 0300) Physical Exam: General: NAD, conversant, good mood, chronically ill, woman Cardiovascular: Distant heart sounds 2/2 body habitus Respiratory: CATBL Abdomen: Soft, NTTP, non-distended Extremities: moving all extremities, no pitting edema  Laboratory: Most recent CBC Lab Results  Component Value Date   WBC 11.6 (H) 05/22/2023   HGB 10.8 (L) 05/22/2023   HCT 32.7 (L) 05/22/2023   MCV 91.3 05/22/2023   PLT 296 05/22/2023   Most recent BMP    Latest Ref Rng & Units 05/22/2023    2:27 AM  BMP  Glucose 70 - 99 mg/dL 409   BUN 6 -  20 mg/dL 811   Creatinine 9.14 - 1.00 mg/dL 7.82   Sodium 956 - 213 mmol/L 131   Potassium 3.5 - 5.1 mmol/L 3.4   Chloride 98 - 111 mmol/L 88   CO2 22 - 32 mmol/L 27   Calcium 8.9 - 10.3 mg/dL 9.1       Bess Kinds, MD 05/22/2023, 1:05 PM  PGY-2, [facility] FPTS Intern pager: [phone], text pages welcome Secure chat group [facility] Teaching Service

## 2023-05-22 NOTE — Progress Notes (Signed)
   05/22/23 2324  BiPAP/CPAP/SIPAP  $ Non-Invasive Ventilator  Non-Invasive Vent Subsequent  BiPAP/CPAP/SIPAP Pt Type Adult  BiPAP/CPAP/SIPAP Resmed  Mask Type Full face mask  Mask Size Large  Respiratory Rate 16 breaths/min  IPAP 18 cmH20  EPAP 13 cmH2O  Flow Rate 4 lpm  Patient Home Equipment No  Auto Titrate No  BiPAP/CPAP /SiPAP Vitals  Pulse Rate 87  Resp 15  SpO2 94 %  MEWS Score/Color  MEWS Score 0  MEWS Score Color [place] Si

## 2023-05-22 NOTE — Assessment & Plan Note (Signed)
Morbidly obese patient on 3-4 L [place] at baseline. Will continue oxygen supplementation and BiPAP nightly -Continue Oxygen supplement -BiPAP nightly 

## 2023-05-22 NOTE — Assessment & Plan Note (Addendum)
P/w precordial chest pain after RHC on 6/6. ACS workup unremarkable. Recent cocaine use may be cause of symptoms. Cardiology plan for [facility] Monday 6/17. -Appreciate HF team recs, diuresis as noted -Plan for right heart cath on Monday 6/17 -cardiac monitoring -ASA, statin

## 2023-05-22 NOTE — Assessment & Plan Note (Addendum)
Last echo 3/24 with EF 55-60%.  Follows with cardiology.  Had RHC on 6/6 showing pulmonary hypertension, RV failure.  Home meds include torsemide 100 mg in the morning and 50 mg in the evening, metolazone 5 mg 3 days/week.  Difficult to assess volume status.  CXR shows mild vascular congestion. -8.9L documented since admit. Plan for [facility] Monday 6/17. -Heart Failure team and nephrology consulted, appreciate recs -RHC on 6/17 -diuresis per nephrology -home metolazone -Strict I/os -Daily weights

## 2023-05-23 ENCOUNTER — Telehealth: Payer: Self-pay | Admitting: Pharmacist

## 2023-05-23 ENCOUNTER — Ambulatory Visit: Payer: 59 | Admitting: Family Medicine

## 2023-05-23 ENCOUNTER — Encounter (HOSPITAL_COMMUNITY)
Admission: EM | Disposition: A | Discharge status: Hospice | Payer: Self-pay | Source: Home / Self Care | Attending: Family Medicine

## 2023-05-23 ENCOUNTER — Telehealth: Payer: Self-pay

## 2023-05-23 DIAGNOSIS — I5033 Acute on chronic diastolic (congestive) heart failure: Secondary | ICD-10-CM | POA: Diagnosis not present

## 2023-05-23 HISTORY — PX: RIGHT HEART CATH: CATH118263

## 2023-05-23 LAB — POCT I-STAT EG7
Acid-Base Excess: 4 mmol/L — ABNORMAL HIGH (ref 0.0–2.0)
Acid-Base Excess: 4 mmol/L — ABNORMAL HIGH (ref 0.0–2.0)
Bicarbonate: 30.7 mmol/L — ABNORMAL HIGH (ref 20.0–28.0)
Bicarbonate: 31 mmol/L — ABNORMAL HIGH (ref 20.0–28.0)
Calcium, Ion: 1.19 mmol/L (ref 1.15–1.40)
Calcium, Ion: 1.21 mmol/L (ref 1.15–1.40)
HCT: 35 % — ABNORMAL LOW (ref 36.0–46.0)
HCT: 35 % — ABNORMAL LOW (ref 36.0–46.0)
Hemoglobin: 11.9 g/dL — ABNORMAL LOW (ref 12.0–15.0)
Hemoglobin: 11.9 g/dL — ABNORMAL LOW (ref 12.0–15.0)
O2 Saturation: 54 %
O2 Saturation: 57 %
Potassium: 4.1 mmol/L (ref 3.5–5.1)
Potassium: 4.1 mmol/L (ref 3.5–5.1)
Sodium: 132 mmol/L — ABNORMAL LOW (ref 135–145)
Sodium: 133 mmol/L — ABNORMAL LOW (ref 135–145)
TCO2: 32 mmol/L (ref 22–32)
TCO2: 33 mmol/L — ABNORMAL HIGH (ref 22–32)
pCO2, Ven: 56.1 mmHg (ref 44–60)
pCO2, Ven: 56.2 mmHg (ref 44–60)
pH, Ven: 7.347 (ref 7.25–7.43)
pH, Ven: 7.35 (ref 7.25–7.43)
pO2, Ven: 31 mmHg — CL (ref 32–45)
pO2, Ven: 32 mmHg (ref 32–45)

## 2023-05-23 LAB — CBC WITH DIFFERENTIAL/PLATELET
Abs Immature Granulocytes: 0 10*3/uL (ref 0.00–0.07)
Basophils Absolute: 0 10*3/uL (ref 0.0–0.1)
Basophils Relative: 0 %
Eosinophils Absolute: 0.4 10*3/uL (ref 0.0–0.5)
Eosinophils Relative: 3 %
HCT: 32.3 % — ABNORMAL LOW (ref 36.0–46.0)
Hemoglobin: 10.6 g/dL — ABNORMAL LOW (ref 12.0–15.0)
Lymphocytes Relative: 32 %
Lymphs Abs: 3.9 10*3/uL (ref 0.7–4.0)
MCH: 29.5 pg (ref 26.0–34.0)
MCHC: 32.8 g/dL (ref 30.0–36.0)
MCV: 90 fL (ref 80.0–100.0)
Monocytes Absolute: 0.7 10*3/uL (ref 0.1–1.0)
Monocytes Relative: 6 %
Neutro Abs: 7.1 10*3/uL (ref 1.7–7.7)
Neutrophils Relative %: 59 %
Platelets: 294 10*3/uL (ref 150–400)
RBC: 3.59 MIL/uL — ABNORMAL LOW (ref 3.87–5.11)
RDW: 16.1 % — ABNORMAL HIGH (ref 11.5–15.5)
WBC: 12.1 10*3/uL — ABNORMAL HIGH (ref 4.0–10.5)
nRBC: 0 % (ref 0.0–0.2)
nRBC: 0 /100 WBC

## 2023-05-23 LAB — MAGNESIUM: Magnesium: 1.7 mg/dL (ref 1.7–2.4)

## 2023-05-23 LAB — RENAL FUNCTION PANEL
Albumin: 2.9 g/dL — ABNORMAL LOW (ref 3.5–5.0)
Anion gap: 15 (ref 5–15)
BUN: 129 mg/dL — ABNORMAL HIGH (ref 6–20)
CO2: 25 mmol/L (ref 22–32)
Calcium: 8.9 mg/dL (ref 8.9–10.3)
Chloride: 89 mmol/L — ABNORMAL LOW (ref 98–111)
Creatinine, Ser: 2.06 mg/dL — ABNORMAL HIGH (ref 0.44–1.00)
GFR, Estimated: 30 mL/min — ABNORMAL LOW (ref >60)
Glucose, Bld: 329 mg/dL — ABNORMAL HIGH (ref 70–99)
Phosphorus: 4.8 mg/dL — ABNORMAL HIGH (ref 2.5–4.6)
Potassium: 3.8 mmol/L (ref 3.5–5.1)
Sodium: 129 mmol/L — ABNORMAL LOW (ref 135–145)

## 2023-05-23 LAB — GLUCOSE, CAPILLARY
Glucose-Capillary: 341 mg/dL — ABNORMAL HIGH (ref 70–99)
Glucose-Capillary: 404 mg/dL — ABNORMAL HIGH (ref 70–99)
Glucose-Capillary: 338 mg/dL — ABNORMAL HIGH (ref 70–99)
Glucose-Capillary: 336 mg/dL — ABNORMAL HIGH (ref 70–99)

## 2023-05-23 SURGERY — RIGHT HEART CATH
Anesthesia: LOCAL

## 2023-05-23 MED ORDER — INSULIN ASPART 100 UNIT/ML IJ SOLN
0.0000 [IU] | Freq: Every day | INTRAMUSCULAR | Status: DC
  Administered 2023-05-23 – 2023-05-26 (×4): 4 [IU] via SUBCUTANEOUS
  Administered 2023-05-27: 3 [IU] via SUBCUTANEOUS

## 2023-05-23 MED ORDER — ONDANSETRON HCL 4 MG/2ML IJ SOLN
4.0000 mg | Freq: Four times a day (QID) | INTRAMUSCULAR | Status: DC | PRN
  Administered 2023-05-23 – 2023-05-27 (×3): 4 mg via INTRAVENOUS
  Filled 2023-05-23 (×3): qty 2

## 2023-05-23 MED ORDER — LABETALOL HCL 5 MG/ML IV SOLN: 10.0000 mg | INTRAVENOUS | Status: AC | PRN

## 2023-05-23 MED ORDER — ACETAMINOPHEN 325 MG PO TABS
650.0000 mg | ORAL_TABLET | ORAL | Status: DC | PRN
  Administered 2023-05-25 – 2023-05-27 (×5): 650 mg via ORAL
  Filled 2023-05-23 (×6): qty 2

## 2023-05-23 MED ORDER — INSULIN GLARGINE-YFGN 100 UNIT/ML ~~LOC~~ SOLN
80.0000 [IU] | Freq: Two times a day (BID) | SUBCUTANEOUS | Status: DC
  Administered 2023-05-23 – 2023-05-27 (×7): 80 [IU] via SUBCUTANEOUS
  Filled 2023-05-23 (×9): qty 0.8

## 2023-05-23 MED ORDER — LIDOCAINE HCL (PF) 1 % IJ SOLN
INTRAMUSCULAR | Status: AC
  Filled 2023-05-23: qty 30

## 2023-05-23 MED ORDER — SODIUM CHLORIDE 0.9% FLUSH
3.0000 mL | Freq: Two times a day (BID) | INTRAVENOUS | Status: DC
  Administered 2023-05-23 – 2023-05-28 (×10): 3 mL via INTRAVENOUS

## 2023-05-23 MED ORDER — HYDROMORPHONE HCL 2 MG PO TABS
1.0000 mg | ORAL_TABLET | Freq: Once | ORAL | Status: AC
  Administered 2023-05-23: 1 mg via ORAL
  Filled 2023-05-23: qty 1

## 2023-05-23 MED ORDER — MAGNESIUM SULFATE 2 GM/50ML IV SOLN
2.0000 g | Freq: Once | INTRAVENOUS | Status: AC
  Administered 2023-05-23: 2 g via INTRAVENOUS
  Filled 2023-05-23: qty 50

## 2023-05-23 MED ORDER — SODIUM CHLORIDE 0.9 % IV SOLN: 250.0000 mL | INTRAVENOUS | Status: DC | PRN

## 2023-05-23 MED ORDER — LIDOCAINE HCL (PF) 1 % IJ SOLN
INTRAMUSCULAR | Status: DC | PRN
  Administered 2023-05-23: 2 mL

## 2023-05-23 MED ORDER — FUROSEMIDE 10 MG/ML IJ SOLN
80.0000 mg | Freq: Every day | INTRAMUSCULAR | Status: DC
  Administered 2023-05-24: 80 mg via INTRAVENOUS
  Filled 2023-05-23: qty 8

## 2023-05-23 MED ORDER — INSULIN ASPART 100 UNIT/ML IJ SOLN
0.0000 [IU] | Freq: Three times a day (TID) | INTRAMUSCULAR | Status: DC
  Administered 2023-05-24 (×3): 20 [IU] via SUBCUTANEOUS
  Administered 2023-05-25: 11 [IU] via SUBCUTANEOUS
  Administered 2023-05-25 (×2): 20 [IU] via SUBCUTANEOUS
  Administered 2023-05-26: 15 [IU] via SUBCUTANEOUS
  Administered 2023-05-26: 20 [IU] via SUBCUTANEOUS
  Administered 2023-05-26: 15 [IU] via SUBCUTANEOUS
  Administered 2023-05-27: 20 [IU] via SUBCUTANEOUS
  Administered 2023-05-27: 15 [IU] via SUBCUTANEOUS
  Administered 2023-05-27: 7 [IU] via SUBCUTANEOUS
  Administered 2023-05-28 (×2): 11 [IU] via SUBCUTANEOUS
  Administered 2023-05-28: 7 [IU] via SUBCUTANEOUS

## 2023-05-23 MED ORDER — HEPARIN (PORCINE) IN NACL 1000-0.9 UT/500ML-% IV SOLN
INTRAVENOUS | Status: DC | PRN
  Administered 2023-05-23: 500 mL

## 2023-05-23 MED ORDER — HYDRALAZINE HCL 20 MG/ML IJ SOLN: 10.0000 mg | INTRAMUSCULAR | Status: AC | PRN

## 2023-05-23 MED ORDER — SODIUM CHLORIDE 0.9% FLUSH: 3.0000 mL | INTRAVENOUS | Status: DC | PRN

## 2023-05-23 SURGICAL SUPPLY — 7 items
CATH BALLN WEDGE 5F 110CM (CATHETERS) IMPLANT
MAT PREVALON FULL STRYKER (MISCELLANEOUS) IMPLANT
PACK CARDIAC CATHETERIZATION (CUSTOM PROCEDURE TRAY) ×2 IMPLANT
PROTECTION STATION PRESSURIZED (MISCELLANEOUS) ×1
SHEATH GLIDE SLENDER 4/5FR (SHEATH) IMPLANT
STATION PROTECTION PRESSURIZED (MISCELLANEOUS) IMPLANT
TRANSDUCER W/STOPCOCK (MISCELLANEOUS) ×2 IMPLANT

## 2023-05-23 NOTE — Assessment & Plan Note (Addendum)
Poorly controlled DM with most recent A1c of 13.5 a month ago. Home meds include Tresiba 70 units twice daily and NovoLog 45u 3 times daily. Glucoses continue to be elevated. -Semglee 55 units today am, 70-80 units in evening after RHC -Novolog 35u TID WC -Resistant SSI with CBGs before every meal and nightly

## 2023-05-23 NOTE — Progress Notes (Signed)
 Rounding Note    Patient Name: [name] Date of Encounter: 05/23/2023  [facility] Cardiologist: Michael Cooper, MD   Subjective   Pt states dyspnea improved; no CP  Inpatient Medications    Scheduled Meds:  acetaminophen  325 mg Oral Q4H   amLODipine  10 mg Oral Daily   aspirin EC  81 mg Oral Daily   atorvastatin  80 mg Oral Daily   brinzolamide  1 drop Both Eyes TID   And   brimonidine  1 drop Both Eyes TID   enoxaparin (LOVENOX) injection  80 mg Subcutaneous Q24H   furosemide  80 mg Intravenous BID   gabapentin  300 mg Oral QHS   insulin aspart  0-20 Units Subcutaneous TID WC   insulin aspart  35 Units Subcutaneous TID AC   insulin glargine-yfgn  50 Units Subcutaneous Daily   latanoprost  1 drop Both Eyes Daily   lidocaine  1 patch Transdermal Q24H   metolazone  5 mg Oral Q M,W,F   mometasone-formoterol  2 puff Inhalation BID   pantoprazole  40 mg Oral Daily   prednisoLONE acetate  1 drop Both Eyes QID   prochlorperazine  10 mg Oral Once   rOPINIRole  1 mg Oral QHS   spironolactone  25 mg Oral Daily   Continuous Infusions:  sodium chloride     PRN Meds: albuterol, diclofenac Sodium, ipratropium-albuterol, nitroGLYCERIN, mouth rinse, polyethylene glycol   Vital Signs    Vitals:   05/22/23 1634 05/22/23 2059 05/22/23 2324 05/23/23 0533  BP: 99/62 (!) 121/90  139/77  Pulse: 95 93 87 89  Resp: 20 18 15 18  Temp: 97.8 F (36.6 C) 97.8 F (36.6 C)  97.6 F (36.4 C)  TempSrc: Oral Oral  Axillary  SpO2: 96% 100% 94% 99%  Weight:    (!) 161.6 kg  Height:       No intake or output data in the 24 hours ending 05/23/23 0733    05/23/2023    5:33 AM 05/22/2023    3:00 AM 05/21/2023    3:30 AM  Last 3 Weights  Weight (lbs) 356 lb 4.2 oz 340 lb 13.3 oz 336 lb 6.8 oz  Weight (kg) 161.6 kg 154.6 kg 152.6 kg      Telemetry    Sinus - Personally Reviewed   Physical Exam   GEN: No acute distress.  Morbidly obese Neck: supple Cardiac:  RRR Respiratory: CTA anteriorly GI: Soft, morbidly obese MS: No edema; bilateral amputations Neuro:  Nonfocal  Psych: Normal affect   Labs    High Sensitivity Troponin:   Recent Labs  Lab 05/15/23 2351 05/16/23 2044 05/16/23 2212 05/18/23 0458 05/18/23 0635  TROPONINIHS 39* 36* 38* 40* 43*     Chemistry Recent Labs  Lab 05/21/23 0143 05/22/23 0227 05/23/23 0226  NA 134* 131* 129*  K 3.4* 3.4* 3.8  CL 85* 88* 89*  CO2 30 27 25  GLUCOSE 319* 337* 329*  BUN 129* 124* 129*  CREATININE 2.02* 1.89* 2.06*  CALCIUM 9.7 9.1 8.9  MG 2.0 1.8 1.7  ALBUMIN 3.0* 3.0* 2.9*  GFRNONAA 30* 33* 30*  ANIONGAP 19* 16* 15   Hematology Recent Labs  Lab 05/19/23 0130 05/22/23 0227 05/23/23 0226  WBC 8.7 11.6* 12.1*  RBC 3.63* 3.58* 3.59*  HGB 10.6* 10.8* 10.6*  HCT 33.0* 32.7* 32.3*  MCV 90.9 91.3 90.0  MCH 29.2 30.2 29.5  MCHC 32.1 33.0 32.8  RDW 16.8* 16.3* 16.1*  PLT   344 296 294    Radiology    VAS [place] UPPER EXTREMITY VENOUS DUPLEX  Result Date: 05/21/2023 UPPER VENOUS STUDY  Patient Name:  Everlee Silber  Date of Exam:   05/21/2023 Medical Rec #: [id number]     Accession #:    [id number] Date of Birth: 01/29/1976      Patient Gender: F Patient Age:   47 years Exam Location:  [facility] Procedure:      VAS US UPPER EXTREMITY VENOUS DUPLEX Referring Phys: MARGARET PRAY --------------------------------------------------------------------------------  Indications: Palpable Cord, and Pain Limitations: Body habitus (BMI 61.53) and Patient unable remain on back. Comparison Study: No prior study Performing Technologist: Candace Kanady RVS  Examination Guidelines: A complete evaluation includes B-mode imaging, spectral Doppler, color Doppler, and power Doppler as needed of all accessible portions of each vessel. Bilateral testing is considered an integral part of a complete examination. Limited examinations for reoccurring indications may be performed as noted.  Left Findings:  +----------+------------+---------+-----------+----------+---------------------+ LEFT      CompressiblePhasicitySpontaneousProperties       Summary        +----------+------------+---------+-----------+----------+---------------------+ IJV                                                    Not visualized     +----------+------------+---------+-----------+----------+---------------------+ Subclavian               Yes       Yes                                    +----------+------------+---------+-----------+----------+---------------------+ Axillary                 Yes       Yes                                    +----------+------------+---------+-----------+----------+---------------------+ Brachial      Full                                                        +----------+------------+---------+-----------+----------+---------------------+ Radial                                                 Not visualized     +----------+------------+---------+-----------+----------+---------------------+ Ulnar                                                  Not visualized     +----------+------------+---------+-----------+----------+---------------------+ Cephalic      None                                   Acute in forearm at                                                          site of pain and                                                              knotting        +----------+------------+---------+-----------+----------+---------------------+ Basilic                                                Not visualized     +----------+------------+---------+-----------+----------+---------------------+  Summary:  Left: No evidence of deep vein thrombosis in the visualized veins of the upper extremity. Findings consistent with acute superficial vein thrombosis involving the left cephalic vein.  *See table(s) above for measurements and  observations.  Diagnosing physician: Thomas Hawken Electronically signed by Thomas Hawken on 05/21/2023 at 6:09:29 PM.    Final      Patient Profile     47 y.o. female  hx of HFpEF/RV failure, OSA, Super morbid obesity BMI 61, uncontrolled DM, CKD IIIb, cocaine use, non-compliance and left leg BKA/right transmetatarsal who is being seen for CHF exacerbation.   Echocardiogram March 2024 showed normal LV function, grade 2 diastolic dysfunction, RV not well-visualized.  Right heart catheterization May 12, 2023 showed elevated right greater than left heart filling pressures (pulmonary capillary wedge pressure 23, RA pressure 19, PA pressure 60/34); suspect primary RV failure.  Diuretics were increased at that time.  Patient subsequently readmitted with CHF.  Assessment & Plan    1 acute on chronic diastolic congestive heart failure/right heart failure-patient symptomatically improving.  Exam is difficult due to morbid obesity.  She is scheduled for right heart catheterization later today to assess filling pressures now status post diuresis.  Will continue Lasix, metolazone and spironolactone at present dose for now.  Follow renal function closely.  She is not a candidate for SGLT2 inhibitor given obesity/risk of infection.  Needs follow-up in the CHF clinic.  2 acute on chronic stage IIIa kidney disease-nephrology is following.  Creatinine is unchanged at present.  3 history of mild nonobstructive coronary artery disease by catheterization December 2021-continue aspirin and statin.  4 hypertension-continue present blood pressure medications.  5 hyperlipidemia-continue statin.  6 history of cocaine use-needs to avoid.  7 diabetes mellitus-Per primary service.  8 obstructive sleep apnea-continue BiPAP.  For questions or updates, please contact [facility] Please consult [email or web address] for contact info under        Signed, Barbee Mamula, MD  05/23/2023, 7:33 AM    

## 2023-05-23 NOTE — Assessment & Plan Note (Signed)
BUN stable, but severely elevated. Creatinine stable. -nephrology consulted, appreciate recs -monitor renal function -Electrolyte disturbances in the setting of CKD and diuresis, continue to monitor and replete as appropriate

## 2023-05-23 NOTE — Assessment & Plan Note (Addendum)
Last echo 3/24 with EF 55-60%. HC on 6/6 showing pulmonary hypertension, RV failure. Volume status difficult to assess due to body habitus, is perfusing well. Diuresing well. -Plan for RHC Monday 6/17. -Heart Failure team and nephrology consulted, appreciate recs -RHC on 6/17 -diuresis per nephrology/cardiology -home metolazone -Mag >2, K>4 -Strict I/os -Daily weights

## 2023-05-23 NOTE — Telephone Encounter (Signed)
[name] is currently on the Dexcom G6, which is set to be discontinued in the near future. She is also on an Omnipod 5. Unfortunately, the Omnipod 5 is only compatible to the Dexcom G6. She is managed by [facility] for her type 2 diabetes.  No changes need to be made at this time.

## 2023-05-23 NOTE — Interval H&P Note (Signed)
History and Physical Interval Note:  05/23/2023 9:25 AM  [name]  has presented today for surgery, with the diagnosis of CHF.  The various methods of treatment have been discussed with the patient and family. After consideration of risks, benefits and other options for treatment, the patient has consented to  Procedure(s): RIGHT HEART CATH (N/A) as a surgical intervention.  The patient's history has been reviewed, patient examined, no change in status, stable for surgery.  I have reviewed the patient's chart and labs.  Questions were answered to the patient's satisfaction.     Edgar Reisz   

## 2023-05-23 NOTE — Progress Notes (Signed)
Daily Progress Note Intern Pager: [phone]  Patient name: [name] Medical record number: [id number] Date of birth: 07/08/1976 Age: 47 y.o. Gender: female  Primary Care Provider: Latrelle Dodrill, MD Consultants: Nephrology, Heart Failure  Code Status: Full Code  Pt Overview and Major Events to Date:  6/9 - admitted   Assessment and Plan: [name] is a 47 y.o. female p/w chest pain in the setting of recent RHC, heart failure, significant kidney disease, diabetes, and OSA.   Hospital Problem List      Hospital     * (Principal) Chest pain     VSS, pain is stable. Patient has chronic history of chest pain likely  multifactorial due to cocaine use/MSK; however, is very high risk for ACS.  Cardiology plan for [facility] Monday 6/17. -Appreciate HF team recs, diuresis as noted -Plan for right heart cath on Monday 6/17 -cardiac monitoring -ASA, statin        Obstructive sleep apnea (adult) (pediatric)     Morbidly obese patient on 3-4 L [place] at baseline. Will continue oxygen  supplementation and BiPAP nightly -Continue Oxygen supplement -BiPAP nightly        Type 2 diabetes mellitus with hyperglycemia ([name])     Poorly controlled DM with most recent A1c of 13.5 a month ago. Home  meds include Tresiba 70 units twice daily and NovoLog 45u 3 times daily.  Glucoses continue to be elevated. -Semglee 55 units today am, 70-80 units in evening after RHC -Novolog 35u TID WC -Resistant SSI with CBGs before every meal and nightly        Azotemia     BUN stable, but severely elevated. Creatinine stable. -nephrology consulted, appreciate recs -monitor renal function -Electrolyte disturbances in the setting of CKD and diuresis, continue to  monitor and replete as appropriate        Acute on chronic diastolic heart failure ([name])     Last echo 3/24 with EF 55-60%. HC on 6/6 showing pulmonary  hypertension, RV failure. Volume status difficult to assess due to body  habitus, is  perfusing well. Diuresing well. -Plan for RHC Monday 6/17. -Heart Failure team and nephrology consulted, appreciate recs -RHC on 6/17 -diuresis per nephrology/cardiology -home metolazone -Mag >2, K>4 -Strict I/os -Daily weights        RVF (right ventricular failure) ([name])    FEN/GI: Regular diet PPx: Lovenox Dispo: Pending clinical improvement  Subjective:  NAEO. Denies chest pain. Feels breathing is improved with Bipap.   Objective: Temp:  [97.6 F (36.4 C)-97.8 F (36.6 C)] 97.6 F (36.4 C) (06/17 0737) Pulse Rate:  [0-103] 99 (06/17 0939) Resp:  [14-20] 14 (06/17 0939) BP: (99-149)/(52-113) 149/93 (06/17 0939) SpO2:  [94 %-100 %] 98 % (06/17 0939) Weight:  [161.6 kg] 161.6 kg (06/17 0533) Physical Exam: General: NAD, resting comfortably in hospital bed Neuro: A&O Cardiovascular: RRR, no murmurs Respiratory: normal WOB on Bipap, CTAB, distant breath sounds Extremities: Moving all 4 extremities equally, trace peripheral edema   Laboratory: Most recent CBC Lab Results  Component Value Date   WBC 12.1 (H) 05/23/2023   HGB 11.9 (L) 05/23/2023   HGB 11.9 (L) 05/23/2023   HCT 35.0 (L) 05/23/2023   HCT 35.0 (L) 05/23/2023   MCV 90.0 05/23/2023   PLT 294 05/23/2023   Most recent BMP    Latest Ref Rng & Units 05/23/2023    9:41 AM  BMP  Sodium 135 - 145 mmol/L 135 - 145 mmol/L 132  133   Potassium 3.5 - 5.1 mmol/L 3.5 - 5.1 mmol/L 4.1    4.1     Mag 1.7  Imaging/Diagnostic Tests: No new imaging.  Celine Mans, MD 05/23/2023, 12:33 PM  PGY-1, [facility] Family Medicine FPTS Intern pager: [phone], text pages welcome Secure chat group [facility] Teaching Service

## 2023-05-23 NOTE — Progress Notes (Addendum)
Patient ID: [name], female   DOB: [birth year], 47 y.o.   MRN: [id number] [facility] Progress Note   Assessment/ Plan:   1. Acute kidney Injury on chronic kidney disease stage IIIb: Secondary to acute exacerbation of diastolic heart failure in the setting of continued cocaine abuse.  Lasix was reduced due to rising creatinine per charting.  She remains on metolazone 3 times a week and was recently started on spironolactone 25 mg daily.   - She does not have any acute indications for dialysis. - re-ordered strict ins/outs  - stop spironolactone   2.  Metabolic alkalosis: Appears largely from CO2 retention with her habitus/OHS and exacerbated by diuretics.  Currently improving after earlier was refractory to acetazolamide administration.  3.  Acute exacerbation of diastolic heart failure: s/p right heart catheterization on 6/17 as above.  We have re-educated on the importance of cocaine cessation.  4.  Hypokalemia: Secondary to ongoing diuresis.  Improved   5.  Hyperphosphatemia: Secondary to acute kidney injury, will continue to trend phosphorus levels. Mild at this time   6.  Insulin-dependent diabetes mellitus: Significant hyperglycemia noted during current hospitalization; management per primary service.  7. CKD stage 3b - note that her baseline Cr appears to be 1.8 - 2.0  Disposition: would continue inpatient monitoring. She is on IV diuretics with AKI.     Subjective:    Strict ins/outs are not available.  She had uop as well as 5 unmeasured urine voids charted over 6/16.  (She states that she didn't actually go 5 times).  She had cardiac catheterization today with mildly reduced cardiac output / cardiac index and per cardiology her hemodynamics are consistent with RV failure likely from chronic OHS/OSA.  She states that she knows that she needs to stop cocaine.  She shares that she has been given information about resources in the area.  She offers that if/when  she needs dialysis that she would want it.  Review of systems:  Denies chest pain  Shortness of breath; she is on 3.5 liters oxygen here and 4 liters at home Not urinating as much as before      Objective:   BP (!) 149/93   Pulse 99   Temp 97.6 F (36.4 C) (Axillary)   Resp 14   Ht 5\' 2"  (1.575 m)   Wt (!) 161.6 kg   SpO2 98%   BMI 65.16 kg/m   Intake/Output Summary (Last 24 hours) at 05/23/2023 1223 Last data filed at 05/23/2023 0700 Gross per 24 hour  Intake --  Output 500 ml  Net -500 ml   Weight change: 7 kg  Physical Exam:   General adult female in bed in no acute distress, morbidly obese body habitus HEENT normocephalic atraumatic extraocular movements intact sclera anicteric Neck supple trachea midline Lungs reduced breath sounds on auscultation; no wheezing; on 3.5 liters oxygen; unlabored at rest Heart S1S2 no rub Abdomen soft nontender obese body habitus  Extremities right foot with transmetatarsal amputation; left BKA; trace edema  Psych normal mood and affect Neuro - alert and oriented; conversant and follows commands   Imaging: CARDIAC CATHETERIZATION  Result Date: 05/23/2023 HEMODYNAMICS: On 4L [place]. Significant respiratory variation. End expiratory values used. RA:   18 mmHg (mean) RV:   18/10-20 mmHg PA:   53/34 mmHg (43 mean) PCWP:  22 mmHg (mean)    Estimated Fick CO/CI   5.2 L/min, 2.2 L/min/m2    TPG    21  mmHg  PVR     4 Wood Units PAPi      1  IMPRESSION: Mildly reduced cardiac output / cardiac index. 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 Significant respiratory variation during RHC likely due to severe central adiposity. End expiratory values used above. Aditya Sabharwal Advanced Heart Failure Mechanical Circulatory support 10:09 AM  VAS [place] UPPER EXTREMITY VENOUS DUPLEX  Result Date: 05/21/2023 UPPER VENOUS STUDY  Patient Name:  [name]   Date of Exam:   05/21/2023 Medical Rec #: [id number]     Accession #:    [id number] Date of Birth: Aug 03, 1976      Patient Gender: F Patient Age:   89 years Exam Location:  [facility] Procedure:      VAS [place] UPPER EXTREMITY VENOUS DUPLEX Referring Phys: MARGARET PRAY --------------------------------------------------------------------------------  Indications: Palpable Cord, and Pain Limitations: Body habitus (BMI 61.53) and Patient unable remain on back. Comparison Study: No prior study Performing Technologist: Sherren Kerns RVS  Examination Guidelines: A complete evaluation includes B-mode imaging, spectral Doppler, color Doppler, and power Doppler as needed of all accessible portions of each vessel. Bilateral testing is considered an integral part of a complete examination. Limited examinations for reoccurring indications may be performed as noted.  Left Findings: +----------+------------+---------+-----------+----------+---------------------+ LEFT      CompressiblePhasicitySpontaneousProperties       Summary        +----------+------------+---------+-----------+----------+---------------------+ IJV                                                    Not visualized     +----------+------------+---------+-----------+----------+---------------------+ Subclavian               Yes       Yes                                    +----------+------------+---------+-----------+----------+---------------------+ Axillary                 Yes       Yes                                    +----------+------------+---------+-----------+----------+---------------------+ Brachial      Full                                                        +----------+------------+---------+-----------+----------+---------------------+ Radial                                                 Not visualized     +----------+------------+---------+-----------+----------+---------------------+ Ulnar  Not visualized     +----------+------------+---------+-----------+----------+---------------------+ Cephalic      None                                   Acute in forearm at                                                        site of pain and                                                              knotting        +----------+------------+---------+-----------+----------+---------------------+ Basilic                                                Not visualized     +----------+------------+---------+-----------+----------+---------------------+  Summary:  Left: No evidence of deep vein thrombosis in the visualized veins of the upper extremity. Findings consistent with acute superficial vein thrombosis involving the left cephalic vein.  *See table(s) above for measurements and observations.  Diagnosing physician: Heath Lark Electronically signed by Heath Lark on 05/21/2023 at 6:09:29 PM.    Final     Labs: BMET Recent Labs  Lab 05/17/23 0236 05/18/23 0119 05/19/23 0130 05/20/23 0207 05/21/23 0143 05/22/23 0227 05/23/23 0226 05/23/23 0941  NA 132* 134* 138 136 134* 131* 129* 133*  132*  K 3.1* 3.7 3.2* 3.5 3.4* 3.4* 3.8 4.1  4.1  CL 84* 83* 83* 86* 85* 88* 89*  --   CO2 35* 36* 36* 36* 30 27 25   --   GLUCOSE 366* 232* 269* 281* 319* 337* 329*  --   BUN 151* 146* 135* 119* 129* 124* 129*  --   CREATININE 2.36* 1.77* 1.87* 1.87* 2.02* 1.89* 2.06*  --   CALCIUM 9.0 9.2 9.7 9.5 9.7 9.1 8.9  --   PHOS 4.8* 4.1 5.1* 4.7* 5.1* 4.5 4.8*  --    CBC Recent Labs  Lab 05/18/23 0119 05/19/23 0130 05/22/23 0227 05/23/23 0226 05/23/23 0941  WBC 9.4 8.7 11.6* 12.1*  --   NEUTROABS  --   --   --  7.1  --   HGB 10.3* 10.6* 10.8* 10.6* 11.9*  11.9*  HCT 32.0* 33.0* 32.7* 32.3* 35.0*  35.0*  MCV 89.4 90.9 91.3 90.0  --   PLT 333 344 296 294  --     Medications:     acetaminophen  325 mg Oral Q4H    amLODipine  10 mg Oral Daily   aspirin EC  81 mg Oral Daily   atorvastatin  80 mg Oral Daily   brinzolamide  1 drop Both Eyes TID   And   brimonidine  1 drop Both Eyes TID   enoxaparin (LOVENOX) injection  80 mg Subcutaneous Q24H   furosemide  80 mg Intravenous BID   gabapentin  300 mg Oral QHS   insulin aspart  0-20 Units Subcutaneous TID  WC   insulin aspart  35 Units Subcutaneous TID AC   insulin glargine-yfgn  80 Units Subcutaneous BID   latanoprost  1 drop Both Eyes Daily   lidocaine  1 patch Transdermal Q24H   metolazone  5 mg Oral Q M,W,F   mometasone-formoterol  2 puff Inhalation BID   pantoprazole  40 mg Oral Daily   prednisoLONE acetate  1 drop Both Eyes QID   prochlorperazine  10 mg Oral Once   rOPINIRole  1 mg Oral QHS   spironolactone  25 mg Oral Daily    Estanislado Emms, MD 05/23/2023, 12:23 PM    Spoke with the patient again after nursing states that the patient's mother called the floor concerned that she was going to start dialysis.  Nurse reports that the patient called her daughter and asked if she was ready to take care of her now because "she was going to be very sick and starting dialysis" per nurse.  I spoke with the patient again to ensure that we were communicating well.  She states that she is not confused - she told her family that she needs dialysis because "that's what you do if your kidneys aren't working".  She was frustrated with my questions.   Reduce lasix 80 mg IV to daily  Pause spironolactone as above Pause metolazone for now and reassess needs for same  CKD stage 3b - baseline Cr appears to be 1.8 - 2.0  Estanislado Emms, MD 1:46 PM 05/23/2023

## 2023-05-23 NOTE — Assessment & Plan Note (Signed)
Poorly controlled DM with most recent A1c of 13.5 a month ago. Home meds include Tresiba 70 units twice daily and NovoLog 45u 3 times daily. Glucoses continue to be elevated. -Semglee 55 units today am, 80 units in evening after RHC -Novolog 35u TID WC -Resistant SSI with CBGs before every meal and nightly

## 2023-05-23 NOTE — Telephone Encounter (Signed)
Patient calls nurse line requesting that I send message to Dr. Pollie Meyer.   She is requesting that Dr. Pollie Meyer come and visit her in the hospital. She states that she has concerns about what is going on with her health and that she feels most comfortable speaking with Dr. Pollie Meyer.   Advised that I would send message to PCP regarding request.   Patient appreciative.   Veronda Prude, RN

## 2023-05-23 NOTE — Assessment & Plan Note (Signed)
Morbidly obese patient on 3-4 L [place] at baseline. Will continue oxygen supplementation and BiPAP nightly -Continue Oxygen supplement -BiPAP nightly 

## 2023-05-23 NOTE — H&P (View-Only) (Signed)
Rounding Note    Patient Name: [name] Date of Encounter: 05/23/2023  [facility] Cardiologist: Tonny Bollman, MD   Subjective   Pt states dyspnea improved; no CP  Inpatient Medications    Scheduled Meds:  acetaminophen  325 mg Oral Q4H   amLODipine  10 mg Oral Daily   aspirin EC  81 mg Oral Daily   atorvastatin  80 mg Oral Daily   brinzolamide  1 drop Both Eyes TID   And   brimonidine  1 drop Both Eyes TID   enoxaparin (LOVENOX) injection  80 mg Subcutaneous Q24H   furosemide  80 mg Intravenous BID   gabapentin  300 mg Oral QHS   insulin aspart  0-20 Units Subcutaneous TID WC   insulin aspart  35 Units Subcutaneous TID AC   insulin glargine-yfgn  50 Units Subcutaneous Daily   latanoprost  1 drop Both Eyes Daily   lidocaine  1 patch Transdermal Q24H   metolazone  5 mg Oral Q M,W,F   mometasone-formoterol  2 puff Inhalation BID   pantoprazole  40 mg Oral Daily   prednisoLONE acetate  1 drop Both Eyes QID   prochlorperazine  10 mg Oral Once   rOPINIRole  1 mg Oral QHS   spironolactone  25 mg Oral Daily   Continuous Infusions:  sodium chloride     PRN Meds: albuterol, diclofenac Sodium, ipratropium-albuterol, nitroGLYCERIN, mouth rinse, polyethylene glycol   Vital Signs    Vitals:   05/22/23 1634 05/22/23 2059 05/22/23 2324 05/23/23 0533  BP: 99/62 (!) 121/90  139/77  Pulse: 95 93 87 89  Resp: 20 18 15 18   Temp: 97.8 F (36.6 C) 97.8 F (36.6 C)  97.6 F (36.4 C)  TempSrc: Oral Oral  Axillary  SpO2: 96% 100% 94% 99%  Weight:    (!) 161.6 kg  Height:       No intake or output data in the 24 hours ending 05/23/23 0733    05/23/2023    5:33 AM 05/22/2023    3:00 AM 05/21/2023    3:30 AM  Last 3 Weights  Weight (lbs) 356 lb 4.2 oz 340 lb 13.3 oz 336 lb 6.8 oz  Weight (kg) 161.6 kg 154.6 kg 152.6 kg      Telemetry    Sinus - Personally Reviewed   Physical Exam   GEN: No acute distress.  Morbidly obese Neck: supple Cardiac:  RRR Respiratory: CTA anteriorly GI: Soft, morbidly obese MS: No edema; bilateral amputations Neuro:  Nonfocal  Psych: Normal affect   Labs    High Sensitivity Troponin:   Recent Labs  Lab 05/15/23 2351 05/16/23 2044 05/16/23 2212 05/18/23 0458 05/18/23 0635  TROPONINIHS 39* 36* 38* 40* 43*     Chemistry Recent Labs  Lab 05/21/23 0143 05/22/23 0227 05/23/23 0226  NA 134* 131* 129*  K 3.4* 3.4* 3.8  CL 85* 88* 89*  CO2 30 27 25   GLUCOSE 319* 337* 329*  BUN 129* 124* 129*  CREATININE 2.02* 1.89* 2.06*  CALCIUM 9.7 9.1 8.9  MG 2.0 1.8 1.7  ALBUMIN 3.0* 3.0* 2.9*  GFRNONAA 30* 33* 30*  ANIONGAP 19* 16* 15   Hematology Recent Labs  Lab 05/19/23 0130 05/22/23 0227 05/23/23 0226  WBC 8.7 11.6* 12.1*  RBC 3.63* 3.58* 3.59*  HGB 10.6* 10.8* 10.6*  HCT 33.0* 32.7* 32.3*  MCV 90.9 91.3 90.0  MCH 29.2 30.2 29.5  MCHC 32.1 33.0 32.8  RDW 16.8* 16.3* 16.1*  PLT  344 296 294    Radiology    VAS [place] UPPER EXTREMITY VENOUS DUPLEX  Result Date: 05/21/2023 UPPER VENOUS STUDY  Patient Name:  [name]  Date of Exam:   05/21/2023 Medical Rec #: [id number]     Accession #:    [id number] Date of Birth: 17-Jun-1976      Patient Gender: F Patient Age:   47 years Exam Location[facility] Procedure:      VAS [place] UPPER EXTREMITY VENOUS DUPLEX Referring Phys: MARGARET PRAY --------------------------------------------------------------------------------  Indications: Palpable Cord, and Pain Limitations: Body habitus (BMI 61.53) and Patient unable remain on back. Comparison Study: No prior study Performing Technologist: Sherren Kerns RVS  Examination Guidelines: A complete evaluation includes B-mode imaging, spectral Doppler, color Doppler, and power Doppler as needed of all accessible portions of each vessel. Bilateral testing is considered an integral part of a complete examination. Limited examinations for reoccurring indications may be performed as noted.  Left Findings:  +----------+------------+---------+-----------+----------+---------------------+ LEFT      CompressiblePhasicitySpontaneousProperties       Summary        +----------+------------+---------+-----------+----------+---------------------+ IJV                                                    Not visualized     +----------+------------+---------+-----------+----------+---------------------+ Subclavian               Yes       Yes                                    +----------+------------+---------+-----------+----------+---------------------+ Axillary                 Yes       Yes                                    +----------+------------+---------+-----------+----------+---------------------+ Brachial      Full                                                        +----------+------------+---------+-----------+----------+---------------------+ Radial                                                 Not visualized     +----------+------------+---------+-----------+----------+---------------------+ Ulnar                                                  Not visualized     +----------+------------+---------+-----------+----------+---------------------+ Cephalic      None                                   Acute in forearm at  site of pain and                                                              knotting        +----------+------------+---------+-----------+----------+---------------------+ Basilic                                                Not visualized     +----------+------------+---------+-----------+----------+---------------------+  Summary:  Left: No evidence of deep vein thrombosis in the visualized veins of the upper extremity. Findings consistent with acute superficial vein thrombosis involving the left cephalic vein.  *See table(s) above for measurements and  observations.  Diagnosing physician: Heath Lark Electronically signed by Heath Lark on 05/21/2023 at 6:09:29 PM.    Final      Patient Profile     47 y.o. female  hx of HFpEF/RV failure, OSA, Super morbid obesity BMI 61, uncontrolled DM, CKD IIIb, cocaine use, non-compliance and left leg BKA/right transmetatarsal who is being seen for CHF exacerbation.   Echocardiogram March 2024 showed normal LV function, grade 2 diastolic dysfunction, RV not well-visualized.  Right heart catheterization May 12, 2023 showed elevated right greater than left heart filling pressures (pulmonary capillary wedge pressure 23, RA pressure 19, PA pressure 60/34); suspect primary RV failure.  Diuretics were increased at that time.  Patient subsequently readmitted with CHF.  Assessment & Plan    1 acute on chronic diastolic congestive heart failure/right heart failure-patient symptomatically improving.  Exam is difficult due to morbid obesity.  She is scheduled for right heart catheterization later today to assess filling pressures now status post diuresis.  Will continue Lasix, metolazone and spironolactone at present dose for now.  Follow renal function closely.  She is not a candidate for SGLT2 inhibitor given obesity/risk of infection.  Needs follow-up in the CHF clinic.  2 acute on chronic stage IIIa kidney disease-nephrology is following.  Creatinine is unchanged at present.  3 history of mild nonobstructive coronary artery disease by catheterization December 2021-continue aspirin and statin.  4 hypertension-continue present blood pressure medications.  5 hyperlipidemia-continue statin.  6 history of cocaine use-needs to avoid.  7 diabetes mellitus-Per primary service.  8 obstructive sleep apnea-continue BiPAP.  For questions or updates, please contact  [facility] Please consult [email or web address] for contact info under        Signed, Olga Millers, MD  05/23/2023, 7:33 AM

## 2023-05-23 NOTE — Inpatient Diabetes Management (Signed)
Inpatient Diabetes Program Recommendations  AACE/ADA: New Consensus Statement on Inpatient Glycemic Control (2015)  Target Ranges:  Prepandial:   less than 140 mg/dL      Peak postprandial:   less than 180 mg/dL (1-2 hours)      Critically ill patients:  140 - 180 mg/dL   Lab Results  Component Value Date   GLUCAP 341 (H) 05/23/2023   HGBA1C 13.6 (H) 04/13/2023    Review of Glycemic Control  Latest Reference Range & Units 05/22/23 07:52 05/22/23 11:19 05/22/23 16:37 05/22/23 21:19 05/23/23 07:36  Glucose-Capillary 70 - 99 mg/dL 161 (H) 096 (H) 045 (H) 434 (H) 341 (H)   Diabetes history: DM 2 Outpatient Diabetes medications: Tresiba 70 units bid, Novolog 45 units tid Current orders for Inpatient glycemic control:  Semglee 50 units Daily Novolog 0-20 untist id Novolog 35 units tid meal coverage  A1c 13.6% on 5/8  Inpatient Diabetes Program Recommendations:    -  Increase Semglee 50 units bid -  Add Novolog hs scale  Patient was seen by DM coordinator on 04/27/23 due to high A1C. Needs close f/u with [facility] endocrinology.   Thanks, Christena Deem RN, MSN, BC-ADM Inpatient Diabetes Coordinator Team Pager [phone] (8a-5p)

## 2023-05-23 NOTE — Assessment & Plan Note (Signed)
VSS, pain is stable. Patient has chronic history of chest pain likely multifactorial due to cocaine use/MSK; however, is very high risk for ACS. Cardiology plan for [facility] Monday 6/17. -Appreciate HF team recs, diuresis as noted -Plan for right heart cath on Monday 6/17 -cardiac monitoring -ASA, statin

## 2023-05-23 NOTE — Progress Notes (Signed)
Pt's CBG 336, no nighttime Novolog coverage currently ordered; attempted to notify Family Medicine Intern via text page. Awaiting further orders.   Bari Edward, RN

## 2023-05-23 NOTE — Interval H&P Note (Signed)
History and Physical Interval Note:  05/23/2023 9:25 AM  [name]  has presented today for surgery, with the diagnosis of CHF.  The various methods of treatment have been discussed with the patient and family. After consideration of risks, benefits and other options for treatment, the patient has consented to  Procedure(s): RIGHT HEART CATH (N/A) as a surgical intervention.  The patient's history has been reviewed, patient examined, no change in status, stable for surgery.  I have reviewed the patient's chart and labs.  Questions were answered to the patient's satisfaction.     Eternity Dexter

## 2023-05-24 ENCOUNTER — Encounter (HOSPITAL_COMMUNITY): Payer: Self-pay | Admitting: Cardiology

## 2023-05-24 DIAGNOSIS — I50811 Acute right heart failure: Secondary | ICD-10-CM | POA: Diagnosis not present

## 2023-05-24 DIAGNOSIS — I5033 Acute on chronic diastolic (congestive) heart failure: Secondary | ICD-10-CM | POA: Diagnosis not present

## 2023-05-24 LAB — CBC WITH DIFFERENTIAL/PLATELET
Abs Immature Granulocytes: 0.25 10*3/uL — ABNORMAL HIGH (ref 0.00–0.07)
Basophils Absolute: 0.1 10*3/uL (ref 0.0–0.1)
Basophils Relative: 1 %
Eosinophils Absolute: 0.3 10*3/uL (ref 0.0–0.5)
Eosinophils Relative: 2 %
HCT: 33.4 % — ABNORMAL LOW (ref 36.0–46.0)
Hemoglobin: 11 g/dL — ABNORMAL LOW (ref 12.0–15.0)
Immature Granulocytes: 2 %
Lymphocytes Relative: 34 %
Lymphs Abs: 4.1 10*3/uL — ABNORMAL HIGH (ref 0.7–4.0)
MCH: 29.5 pg (ref 26.0–34.0)
MCHC: 32.9 g/dL (ref 30.0–36.0)
MCV: 89.5 fL (ref 80.0–100.0)
Monocytes Absolute: 1 10*3/uL (ref 0.1–1.0)
Monocytes Relative: 8 %
Neutro Abs: 6.4 10*3/uL (ref 1.7–7.7)
Neutrophils Relative %: 53 %
Platelets: 285 10*3/uL (ref 150–400)
RBC: 3.73 MIL/uL — ABNORMAL LOW (ref 3.87–5.11)
RDW: 16.2 % — ABNORMAL HIGH (ref 11.5–15.5)
WBC: 12.1 10*3/uL — ABNORMAL HIGH (ref 4.0–10.5)
nRBC: 0 % (ref 0.0–0.2)

## 2023-05-24 LAB — GLUCOSE, CAPILLARY
Glucose-Capillary: 308 mg/dL — ABNORMAL HIGH (ref 70–99)
Glucose-Capillary: 344 mg/dL — ABNORMAL HIGH (ref 70–99)
Glucose-Capillary: 377 mg/dL — ABNORMAL HIGH (ref 70–99)
Glucose-Capillary: 405 mg/dL — ABNORMAL HIGH (ref 70–99)
Glucose-Capillary: 443 mg/dL — ABNORMAL HIGH (ref 70–99)

## 2023-05-24 LAB — COMPREHENSIVE METABOLIC PANEL
ALT: 25 U/L (ref 0–44)
AST: 19 U/L (ref 15–41)
Albumin: 2.9 g/dL — ABNORMAL LOW (ref 3.5–5.0)
Alkaline Phosphatase: 163 U/L — ABNORMAL HIGH (ref 38–126)
Anion gap: 15 (ref 5–15)
BUN: 132 mg/dL — ABNORMAL HIGH (ref 6–20)
CO2: 25 mmol/L (ref 22–32)
Calcium: 9.1 mg/dL (ref 8.9–10.3)
Chloride: 91 mmol/L — ABNORMAL LOW (ref 98–111)
Creatinine, Ser: 2.21 mg/dL — ABNORMAL HIGH (ref 0.44–1.00)
GFR, Estimated: 27 mL/min — ABNORMAL LOW (ref >60)
Glucose, Bld: 371 mg/dL — ABNORMAL HIGH (ref 70–99)
Potassium: 4.2 mmol/L (ref 3.5–5.1)
Sodium: 131 mmol/L — ABNORMAL LOW (ref 135–145)
Total Bilirubin: 0.5 mg/dL (ref 0.3–1.2)
Total Protein: 7.9 g/dL (ref 6.5–8.1)

## 2023-05-24 LAB — PHOSPHORUS: Phosphorus: 5.5 mg/dL — ABNORMAL HIGH (ref 2.5–4.6)

## 2023-05-24 LAB — MAGNESIUM: Magnesium: 2 mg/dL (ref 1.7–2.4)

## 2023-05-24 MED ORDER — METOLAZONE 5 MG PO TABS
5.0000 mg | ORAL_TABLET | Freq: Once | ORAL | Status: AC
  Administered 2023-05-24: 5 mg via ORAL
  Filled 2023-05-24: qty 1

## 2023-05-24 MED ORDER — HYDROMORPHONE HCL 2 MG PO TABS
1.0000 mg | ORAL_TABLET | Freq: Once | ORAL | Status: AC
  Administered 2023-05-25: 1 mg via ORAL
  Filled 2023-05-24: qty 1

## 2023-05-24 MED ORDER — INSULIN ASPART 100 UNIT/ML IJ SOLN
45.0000 [IU] | Freq: Three times a day (TID) | INTRAMUSCULAR | Status: DC
  Administered 2023-05-24 – 2023-05-27 (×8): 45 [IU] via SUBCUTANEOUS
  Filled 2023-05-24: qty 0.45

## 2023-05-24 MED ORDER — GLIPIZIDE ER 2.5 MG PO TB24
2.5000 mg | ORAL_TABLET | Freq: Every day | ORAL | Status: DC
  Administered 2023-05-24 – 2023-05-26 (×2): 2.5 mg via ORAL
  Filled 2023-05-24 (×3): qty 1

## 2023-05-24 MED ORDER — FUROSEMIDE 10 MG/ML IJ SOLN
80.0000 mg | Freq: Two times a day (BID) | INTRAMUSCULAR | Status: DC
  Filled 2023-05-24: qty 8

## 2023-05-24 MED ORDER — FUROSEMIDE 10 MG/ML IJ SOLN
60.0000 mg | Freq: Once | INTRAMUSCULAR | Status: AC
  Administered 2023-05-24: 60 mg via INTRAVENOUS
  Filled 2023-05-24: qty 6

## 2023-05-24 MED ORDER — MILRINONE LACTATE IN DEXTROSE 20-5 MG/100ML-% IV SOLN
0.1250 ug/kg/min | INTRAVENOUS | Status: AC
  Administered 2023-05-24 – 2023-05-27 (×7): 0.25 ug/kg/min via INTRAVENOUS
  Filled 2023-05-24 (×8): qty 100

## 2023-05-24 NOTE — Progress Notes (Signed)
Rounding Note    Patient Name: [name] Date of Encounter: 05/24/2023  [facility] Cardiologist: Tonny Bollman, MD   Subjective   No CP this AM; remains dyspneic but improved compared to admission  Inpatient Medications    Scheduled Meds:  acetaminophen  325 mg Oral Q4H   amLODipine  10 mg Oral Daily   aspirin EC  81 mg Oral Daily   atorvastatin  80 mg Oral Daily   brinzolamide  1 drop Both Eyes TID   And   brimonidine  1 drop Both Eyes TID   enoxaparin (LOVENOX) injection  80 mg Subcutaneous Q24H   furosemide  80 mg Intravenous Daily   gabapentin  300 mg Oral QHS   insulin aspart  0-20 Units Subcutaneous TID WC   insulin aspart  0-5 Units Subcutaneous QHS   insulin aspart  35 Units Subcutaneous TID AC   insulin glargine-yfgn  80 Units Subcutaneous BID   latanoprost  1 drop Both Eyes Daily   lidocaine  1 patch Transdermal Q24H   mometasone-formoterol  2 puff Inhalation BID   pantoprazole  40 mg Oral Daily   prednisoLONE acetate  1 drop Both Eyes QID   prochlorperazine  10 mg Oral Once   rOPINIRole  1 mg Oral QHS   sodium chloride flush  3 mL Intravenous Q12H   Continuous Infusions:  sodium chloride     PRN Meds: sodium chloride, acetaminophen, albuterol, diclofenac Sodium, ipratropium-albuterol, nitroGLYCERIN, ondansetron (ZOFRAN) IV, mouth rinse, polyethylene glycol, sodium chloride flush   Vital Signs    Vitals:   05/23/23 1234 05/23/23 1623 05/23/23 2010 05/24/23 0410  BP: 135/62 (!) 104/53 123/80 132/82  Pulse: (!) 102 95 92 99  Resp: 15 19 14 15   Temp: 98.3 F (36.8 C) 98.5 F (36.9 C) 97.6 F (36.4 C) 97.9 F (36.6 C)  TempSrc: Oral Oral Oral Axillary  SpO2: 98% 98%    Weight:    (!) 158.1 kg  Height:        Intake/Output Summary (Last 24 hours) at 05/24/2023 0725 Last data filed at 05/23/2023 2200 Gross per 24 hour  Intake 1316 ml  Output 700 ml  Net 616 ml      05/24/2023    4:10 AM 05/23/2023    5:33 AM 05/22/2023    3:00  AM  Last 3 Weights  Weight (lbs) 348 lb 8 oz 356 lb 4.2 oz 340 lb 13.3 oz  Weight (kg) 158.079 kg 161.6 kg 154.6 kg      Telemetry    Sinus - Personally Reviewed   Physical Exam   GEN: NAD.  Morbidly obese Neck: supple, JVP difficult to assess. Cardiac: RRR, no rub Respiratory: CTA anteriorly; no wheeze GI: Soft, morbidly obese, no masses but exam difficult MS: No edema; bilateral amputations Neuro:  Nonfocal  Psych: Normal affect   Labs    High Sensitivity Troponin:   Recent Labs  Lab 05/15/23 2351 05/16/23 2044 05/16/23 2212 05/18/23 0458 05/18/23 0635  TROPONINIHS 39* 36* 38* 40* 43*      Chemistry Recent Labs  Lab 05/22/23 0227 05/23/23 0226 05/23/23 0941 05/24/23 0221  NA 131* 129* 133*  132* 131*  K 3.4* 3.8 4.1  4.1 4.2  CL 88* 89*  --  91*  CO2 27 25  --  25  GLUCOSE 337* 329*  --  371*  BUN 124* 129*  --  132*  CREATININE 1.89* 2.06*  --  2.21*  CALCIUM 9.1 8.9  --  9.1  MG 1.8 1.7  --  2.0  PROT  --   --   --  7.9  ALBUMIN 3.0* 2.9*  --  2.9*  AST  --   --   --  19  ALT  --   --   --  25  ALKPHOS  --   --   --  163*  BILITOT  --   --   --  0.5  GFRNONAA 33* 30*  --  27*  ANIONGAP 16* 15  --  15    Hematology Recent Labs  Lab 05/22/23 0227 05/23/23 0226 05/23/23 0941 05/24/23 0221  WBC 11.6* 12.1*  --  12.1*  RBC 3.58* 3.59*  --  3.73*  HGB 10.8* 10.6* 11.9*  11.9* 11.0*  HCT 32.7* 32.3* 35.0*  35.0* 33.4*  MCV 91.3 90.0  --  89.5  MCH 30.2 29.5  --  29.5  MCHC 33.0 32.8  --  32.9  RDW 16.3* 16.1*  --  16.2*  PLT 296 294  --  285     Radiology    CARDIAC CATHETERIZATION  Result Date: 05/23/2023 HEMODYNAMICS: On 4L [place]. Significant respiratory variation. End expiratory values used. RA:   18 mmHg (mean) RV:   18/10-20 mmHg PA:   53/34 mmHg (43 mean) PCWP:  22 mmHg (mean)    Estimated Fick CO/CI   5.2 L/min, 2.2 L/min/m2    TPG    21  mmHg     PVR     4 Wood Units PAPi      1  IMPRESSION: Mildly reduced cardiac output /  cardiac index. 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 Significant respiratory variation during RHC likely due to severe central adiposity. End expiratory values used above. Aditya Sabharwal Advanced Heart Failure Mechanical Circulatory support 10:09 AM    Patient Profile     47 y.o. female  hx of HFpEF/RV failure, OSA, Super morbid obesity BMI 61, uncontrolled DM, CKD IIIb, cocaine use, non-compliance and left leg BKA/right transmetatarsal who is being seen for CHF exacerbation.   Echocardiogram March 2024 showed normal LV function, grade 2 diastolic dysfunction, RV not well-visualized.  Right heart catheterization May 12, 2023 showed elevated right greater than left heart filling pressures (pulmonary capillary wedge pressure 23, RA pressure 19, PA pressure 60/34); suspect primary RV failure.  Diuretics were increased at that time.  Patient subsequently readmitted with CHF.  Assessment & Plan    1 acute on chronic diastolic congestive heart failure/right heart failure-status remains difficult to assess.  Right heart catheterization yesterday consistent with RV failure possibly secondary to obesity hypoventilation syndrome and obstructive sleep apnea.  BUN and creatinine continue to rise.  Spironolactone discontinued and Zaroxolyn held by nephrology.  Will continue Lasix at present dose.  She is not a candidate for SGLT2 inhibitor given obesity/risk of infection.  Options are limited.  Will ask CHF team to evaluate.  Only option may be dialysis for volume excess.  2 acute on chronic stage IIIa kidney disease-nephrology following.  3 history of mild nonobstructive coronary artery disease by catheterization December 2021-continue aspirin and statin.  4 hypertension-blood pressure controlled.  Continue present medical regimen.  5 hyperlipidemia-continue statin.  6 history of cocaine use-needs to  avoid.  7 diabetes mellitus-Per primary service.  8 obstructive sleep apnea-continue BiPAP.  For questions or updates, please contact [facility] Please consult [email or web address] for  contact info under        Signed, Olga Millers, MD  05/24/2023, 7:25 AM

## 2023-05-24 NOTE — Progress Notes (Signed)
Daily Progress Note Intern Pager: [phone]  Patient name: [name] Medical record number: [id number] Date of birth: 1976-02-21 Age: 47 y.o. Gender: female  Primary Care Provider: Latrelle Dodrill, MD Consultants: Nephrology, Heart Failure Code Status: Full Code  Pt Overview and Major Events to Date:  6/9 - admitted   Assessment and Plan: [name] is a 47 y.o. female p/w chest pain in the setting of recent RHC, heart failure, significant kidney disease, diabetes, and OSA.   Hospital Problem List      Hospital     * (Principal) Chest pain     VSS, pain improved. Patient has chronic history of chest pain likely  multifactorial due to cocaine use/MSK; however, is very high risk for ACS.   -Appreciate HF team recs, diuresis as noted -cardiac monitoring -ASA, statin        Obstructive sleep apnea (adult) (pediatric)     Morbidly obese patient on 3-4 L [place] at baseline. Will continue oxygen  supplementation and BiPAP nightly -Continue Oxygen supplement -BiPAP nightly        Type 2 diabetes mellitus with hyperglycemia ([name])     Poorly controlled DM with most recent A1c of 13.5 a month ago. Home  meds include Tresiba 70 units twice daily and NovoLog 45u 3 times daily.  Glucoses continue to be elevated. At bedtime coverage added overnight. -Semglee 80u BID -Novolog 35u TID WC -Resistant SSI with CBGs before every meal and nightly        Azotemia     BUN stable, but severely elevated. Creatinine worsening to 2.21, after  decreasing diuresis and hold spironolactone per nephrology recommendations  yesterday. -nephrology consulted, appreciate recs -monitor renal function -Electrolyte disturbances in the setting of CKD and diuresis, continue to  monitor and replete as appropriate        Acute on chronic diastolic heart failure ([name])     Last echo 3/24 with EF 55-60%. RHC yesterday demonstrating continued RH  failure. -Heart Failure team and nephrology  consulted, appreciate recs -diuresis per nephrology/cardiology -hold home metolazone per nephro -Lasix 80mg  IV BID -Mag >2, K>4 -Strict I/os -Daily weights        RVF (right ventricular failure) ([name])    FEN/GI: Regular diet PPx: Lovenox Dispo:Pending clinical improvement  Subjective:  NAEO. Denies chest pain or shortness of breath. Discussed importance of continuing Lovenox to help prevent clots (patient agreeable). Readdressed her medical care, explaining her complex medical problems require multidisciplinary care and treatment of one problem may worsen others which is why it may sometimes seem conflicting. Patient felt some of her frustration was alleviated after this discussion.   Objective: Temp:  [97.6 F (36.4 C)-98.5 F (36.9 C)] 98 F (36.7 C) (06/18 0730) Pulse Rate:  [77-99] 77 (06/18 0730) Resp:  [14-19] 18 (06/18 0730) BP: (104-132)/(53-82) 132/82 (06/18 0730) SpO2:  [95 %-98 %] 95 % (06/18 0730) Weight:  [158.1 kg] 158.1 kg (06/18 0410) Physical Exam: General: NAD, sitting comfortably in hospital chair eating breakfast Neuro: A&O Cardiovascular: RRR, no murmurs, no peripheral edema Respiratory: normal WOB on 4L [place], CTAB, no wheezes, ronchi or rales Extremities: Moving all 4 extremities equally   Laboratory: Most recent CBC Lab Results  Component Value Date   WBC 12.1 (H) 05/24/2023   HGB 11.0 (L) 05/24/2023   HCT 33.4 (L) 05/24/2023   MCV 89.5 05/24/2023   PLT 285 05/24/2023   Most recent BMP    Latest Ref Rng & Units 05/24/2023  2:21 AM  BMP  Glucose 70 - 99 mg/dL 161   BUN 6 - 20 mg/dL 096   Creatinine 0.45 - 1.00 mg/dL 4.09   Sodium 811 - 914 mmol/L 131   Potassium 3.5 - 5.1 mmol/L 4.2   Chloride 98 - 111 mmol/L 91   CO2 22 - 32 mmol/L 25   Calcium 8.9 - 10.3 mg/dL 9.1     Mag - 2.0  Imaging/Diagnostic Tests: No new imaging.  Celine Mans, MD 05/24/2023, 12:57 PM  PGY-1, [facility] FPTS Intern pager:  [phone], text pages welcome Secure chat group [facility] Teaching Service

## 2023-05-24 NOTE — Assessment & Plan Note (Addendum)
BUN stable, but severely elevated. Creatinine worsening to 2.21, after decreasing diuresis and hold spironolactone per nephrology recommendations yesterday. -nephrology consulted, appreciate recs -monitor renal function -Electrolyte disturbances in the setting of CKD and diuresis, continue to monitor and replete as appropriate

## 2023-05-24 NOTE — Plan of Care (Signed)
Primary team contacted PCCM regarding central line placement for co-ox measurements. Patient not a candidate for PICC due to renal dysfunction. Overall patient with poor prognosis and not a candidate for advanced cardiac therapies and a poor candidate for long term dialysis in setting of advanced RV failure and pulmonary HTN. I contacted Heart Failure attending and after discussion will plan to clinically monitor patient on limited trial of milrinone and diuresis. No central access needed at this time.   Transfer to ICU cancelled. PCCM available as needed

## 2023-05-24 NOTE — Assessment & Plan Note (Addendum)
Last echo 3/24 with EF 55-60%. RHC yesterday demonstrating continued RH failure. -Heart Failure team and nephrology consulted, appreciate recs -diuresis per nephrology/cardiology -hold home metolazone per nephro -Lasix 80mg  IV daily -Mag >2, K>4 -Strict I/os -Daily weights

## 2023-05-24 NOTE — Telephone Encounter (Signed)
Saw patient in hospital, will enter social note in that encounter Belinda Dodrill, MD

## 2023-05-24 NOTE — Plan of Care (Signed)

## 2023-05-24 NOTE — Progress Notes (Signed)
Physical Therapy Treatment Patient Details Name: [name] MRN: [id number] DOB: August 12, 1976 Today's Date: 05/24/2023   History of Present Illness Pt is a 47 year old female admitted on 05/14/23 with chest pain after RHC on 6/6. ACS workup unremarkable. Pt with recent cocaine use. Past medical hx of HFpEF/RV failure, OSA, Super morbid obesity, uncontrolled DM, CKD IIIb, cocaine use, non-compliance and left leg BKA/right transmetatarsal.    PT Comments    Pt making steady progress with mobility. Primarily uses w/c at home. From PT standpoing ready for dc when pt medically ready.    Recommendations for follow up therapy are one component of a multi-disciplinary discharge planning process, led by the attending physician.  Recommendations may be updated based on patient status, additional functional criteria and insurance authorization.  Follow Up Recommendations       Assistance Recommended at Discharge Intermittent Supervision/Assistance  Patient can return home with the following A little help with bathing/dressing/bathroom;Assist for transportation   Equipment Recommendations  None recommended by PT    Recommendations for Other Services       Precautions / Restrictions Precautions Precautions: Fall;Other (comment) Precaution Comments: L BKA, R transmet, watch SpO2 Required Braces or Orthoses: Other Brace Other Brace: L prothesis Restrictions Weight Bearing Restrictions: No     Mobility  Bed Mobility               General bed mobility comments: Pt up in chair at start and then up on EOB at end of session    Transfers Overall transfer level: Needs assistance Equipment used: Rolling walker (2 wheels) Transfers: Sit to/from Stand, Bed to chair/wheelchair/BSC Sit to Stand: Supervision Stand pivot transfers: Supervision         General transfer comment: supervision for safety. Pt pushes up on walker with both hands. This is how pt has performed in past     Ambulation/Gait Ambulation/Gait assistance: Min guard, +2 safety/equipment Gait Distance (Feet): 25 Feet (25' x 2, 10' x 1) Assistive device: Rolling walker (2 wheels) Gait Pattern/deviations: Decreased stride length, Decreased step length - right, Decreased step length - left Gait velocity: decreased Gait velocity interpretation: 1.31 - 2.62 ft/sec, indicative of limited community ambulator   General Gait Details: Assist for safety. Pt fatigues quickly. Second person for chair follow so pt could push distance   Social research officer, government Rankin (Stroke Patients Only)       Balance Overall balance assessment: Needs assistance Sitting-balance support: Feet supported Sitting balance-Leahy Scale: Good     Standing balance support: During functional activity, Single extremity supported, Bilateral upper extremity supported Standing balance-Leahy Scale: Poor Standing balance comment: walker and supervision for static standing                            Cognition Arousal/Alertness: Awake/alert Behavior During Therapy: WFL for tasks assessed/performed Overall Cognitive Status: Within Functional Limits for tasks assessed                                          Exercises      General Comments        Pertinent Vitals/Pain Pain Assessment Pain Assessment: No/denies pain    Home Living  Prior Function            PT Goals (current goals can now be found in the care plan section) Acute Rehab PT Goals Patient Stated Goal: to walk Progress towards PT goals: Progressing toward goals    Frequency    Min 1X/week      PT Plan Current plan remains appropriate    Co-evaluation              AM-PAC PT "6 Clicks" Mobility   Outcome Measure  Help needed turning from your back to your side while in a flat bed without using bedrails?: None Help needed moving from  lying on your back to sitting on the side of a flat bed without using bedrails?: None Help needed moving to and from a bed to a chair (including a wheelchair)?: A Little Help needed standing up from a chair using your arms (e.g., wheelchair or bedside chair)?: A Little Help needed to walk in hospital room?: A Little Help needed climbing 3-5 steps with a railing? : A Lot 6 Click Score: 19    End of Session Equipment Utilized During Treatment: Oxygen (4L O2 (same as baseline)) Activity Tolerance: Patient tolerated treatment well Patient left: in bed;with call bell/phone within reach (sitting EOB) Nurse Communication: Mobility status PT Visit Diagnosis: Other abnormalities of gait and mobility (R26.89)     Time: 5784-6962 PT Time Calculation (min) (ACUTE ONLY): 27 min  Charges:  $Gait Training: 23-37 mins                     [facility] PT Acute Rehabilitation Services Office [phone]    Angelina Ok [facility] 05/24/2023, 5:18 PM

## 2023-05-24 NOTE — Progress Notes (Signed)
Patient ID: [name], female   DOB: September 30, 1976, 47 y.o.   MRN: [id number] [facility] Progress Note   Assessment/ Plan:   1. Acute kidney Injury on chronic kidney disease stage IIIb: Secondary to acute exacerbation of diastolic heart failure in the setting of continued cocaine abuse.  Lasix was reduced due to rising creatinine per charting.  She remains on metolazone 3 times a week and was recently started on spironolactone 25 mg daily.   - She does not have any acute indications for dialysis however is near the need for dialysis.  Unfortunately, I agree with heart failure team that she has a high risk for complications on long-term intermittent HD    - Heart failure team has added a limited-time trial of milrinone - will reassess how she tolerates the milrinone.  Then unfortunately she would need HD  - re-ordered strict ins/outs  - note that advanced CHF team has also recommended consideration of palliative care consult and I agree  2.  Metabolic alkalosis: Appears largely from CO2 retention with her habitus/OHS and exacerbated by diuretics.  Currently improving after earlier was refractory to acetazolamide administration.  3.  Acute exacerbation of diastolic heart failure: s/p right heart catheterization on 6/17 as above.  We have re-educated on the importance of cocaine cessation.  4.  Hypokalemia: Secondary to ongoing diuresis.  Improved   5.  Hyperphosphatemia: Secondary to acute kidney injury, will continue to trend phosphorus levels.   6.  Insulin-dependent diabetes mellitus: Significant hyperglycemia noted during current hospitalization; management per primary service.  7. CKD stage 3b - note that her baseline Cr appears to be 1.8 - 2.0  Disposition: would continue inpatient monitoring   Subjective:    Yesterday she had called her family and told them that she needed dialysis because her kidneys weren't working; I had come back to her room a second time to discuss  my assessment after her nurse reached out.  Yesterday we reduced lasix to 80 mg IV daily, paused spironolactone, and paused metolazone.  Strict ins/outs are not available.  She had 700 mL UOP over 6/17 documented as well as one unmeasured urine void today.  She offers that if she needs dialysis that she would want it.  The heart failure team was consulted today and added milrinone this afternoon.  They increased lasix back to 80 mg IV BID and gave metolazone.  Cardiology indicates that she would be high risk for complications on long-term intermittent HD and I agree.  They do feel that she has a poor prognosis and indicated that she is not a candidate for any advanced therapies including home inotropes due to recent drug use as well as BMI of 63.   Review of systems:    Denies chest pain  She has shortness of breath with exertion; she is on supplemental oxygen  Has urinated twice today     Objective:   BP 132/82 (BP Location: Right Leg)   Pulse 77   Temp 98 F (36.7 C) (Oral)   Resp 18   Ht 5\' 2"  (1.575 m)   Wt (!) 158.1 kg   SpO2 95%   BMI 63.74 kg/m   Intake/Output Summary (Last 24 hours) at 05/24/2023 1556 Last data filed at 05/24/2023 0757 Gross per 24 hour  Intake 2160 ml  Output 400 ml  Net 1760 ml   Weight change: -3.521 kg  Physical Exam:    General adult female in bed in no acute distress, morbidly obese  body habitus HEENT normocephalic atraumatic extraocular movements intact sclera anicteric Neck supple trachea midline Lungs reduced breath sounds on auscultation; no wheezing; on supplemental oxygen; unlabored at rest Heart S1S2 no rub Abdomen soft nontender obese body habitus  Extremities right foot with transmetatarsal amputation; left BKA; 2+ edema lower extremities Psych normal mood and affect Neuro - alert and oriented; conversant and follows commands   Imaging: CARDIAC CATHETERIZATION  Result Date: 05/23/2023 HEMODYNAMICS: On 4L [place]. Significant respiratory  variation. End expiratory values used. RA:   18 mmHg (mean) RV:   18/10-20 mmHg PA:   53/34 mmHg (43 mean) PCWP:  22 mmHg (mean)    Estimated Fick CO/CI   5.2 L/min, 2.2 L/min/m2    TPG    21  mmHg     PVR     4 Wood Units PAPi      1  IMPRESSION: Mildly reduced cardiac output / cardiac index. 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 Significant respiratory variation during RHC likely due to severe central adiposity. End expiratory values used above. Aditya Sabharwal Advanced Heart Failure Mechanical Circulatory support 10:09 AM   Labs: BMET Recent Labs  Lab 05/18/23 0119 05/19/23 0130 05/20/23 0207 05/21/23 0143 05/22/23 0227 05/23/23 0226 05/23/23 0941 05/24/23 0221  NA 134* 138 136 134* 131* 129* 133*  132* 131*  K 3.7 3.2* 3.5 3.4* 3.4* 3.8 4.1  4.1 4.2  CL 83* 83* 86* 85* 88* 89*  --  91*  CO2 36* 36* 36* 30 27 25   --  25  GLUCOSE 232* 269* 281* 319* 337* 329*  --  371*  BUN 146* 135* 119* 129* 124* 129*  --  132*  CREATININE 1.77* 1.87* 1.87* 2.02* 1.89* 2.06*  --  2.21*  CALCIUM 9.2 9.7 9.5 9.7 9.1 8.9  --  9.1  PHOS 4.1 5.1* 4.7* 5.1* 4.5 4.8*  --  5.5*   CBC Recent Labs  Lab 05/19/23 0130 05/22/23 0227 05/23/23 0226 05/23/23 0941 05/24/23 0221  WBC 8.7 11.6* 12.1*  --  12.1*  NEUTROABS  --   --  7.1  --  6.4  HGB 10.6* 10.8* 10.6* 11.9*  11.9* 11.0*  HCT 33.0* 32.7* 32.3* 35.0*  35.0* 33.4*  MCV 90.9 91.3 90.0  --  89.5  PLT 344 296 294  --  285    Medications:     acetaminophen  325 mg Oral Q4H   amLODipine  10 mg Oral Daily   aspirin EC  81 mg Oral Daily   atorvastatin  80 mg Oral Daily   brinzolamide  1 drop Both Eyes TID   And   brimonidine  1 drop Both Eyes TID   enoxaparin (LOVENOX) injection  80 mg Subcutaneous Q24H   furosemide  80 mg Intravenous Daily   gabapentin  300 mg Oral QHS   glipiZIDE  2.5 mg Oral Q breakfast   insulin aspart  0-20  Units Subcutaneous TID WC   insulin aspart  0-5 Units Subcutaneous QHS   insulin aspart  45 Units Subcutaneous TID AC   insulin glargine-yfgn  80 Units Subcutaneous BID   latanoprost  1 drop Both Eyes Daily   lidocaine  1 patch Transdermal Q24H   mometasone-formoterol  2 puff Inhalation BID   pantoprazole  40 mg Oral Daily   prednisoLONE acetate  1 drop Both Eyes QID   prochlorperazine  10 mg Oral Once  rOPINIRole  1 mg Oral QHS   sodium chloride flush  3 mL Intravenous Q12H   Imaging:  Heart cath on 6/17 with mildly reduced cardiac output / cardiac index and per cardiology her hemodynamics are consistent with RV failure likely from chronic OHS/OSA.    Estanislado Emms, MD 05/24/2023, 4:44 PM

## 2023-05-24 NOTE — Progress Notes (Deleted)
PCCM:  Received call from FMTS regarding need for central line placement for Co-ox (per AHF team). Patient was seen by Dr. Gasper Lloyd earlier in the day and started on milrinone. Discussed PICC candidacy; however patient has CKD stage IIIb with worsening renal function and likely impending dialysis. D/w Nephrology (Dr. Malen Gauze) who prefers to avoid PICC placement at this time.  Given patient's escalating needs (hypotension, milrinone initiation, need for central access, possible need for dialysis access/start), would recommend transfer to ICU at this time for further care. Central access can be more safely placed in ICU than on the floor.  Transfer order placed, will proceed with obtaining central access once arriving to unit.  Tim Lair, PA-C [facility] 05/24/23 5:53 PM  Please see [email or web address] for pager details.  From 7A-7P if no response, please call [phone] After hours, please call ELink [phone]

## 2023-05-24 NOTE — Assessment & Plan Note (Signed)
Morbidly obese patient on 3-4 L [place] at baseline. Will continue oxygen supplementation and BiPAP nightly -Continue Oxygen supplement -BiPAP nightly 

## 2023-05-24 NOTE — Consult Note (Signed)
Advanced Heart Failure Team Consult Note   Primary Physician: Latrelle Dodrill, MD PCP-Cardiologist:  Tonny Bollman, MD  Reason for Consultation: Pulmonary HTN, Acute on Chronic RV Failure   HPI:    [name] is seen today for evaluation of pulmonary HTN and acute on chronic RV failure at the request of Dr.Crenshaw, Cardiology.   47 y.o. female with a hx of super morbid obesity, uncontrolled diabetes (most recent Hgb A1c 13, down from >15.5), peripheral arterial disease with left leg BKA and right transmetatarsal amputations, obesity hypoventilation syndrome, chronic hypoxic respiratory failure on chronic home O2 (3-4L/min b/l), BiPAP at night, chronic diastolic heart failure, CKD IIIB and h/o substance use (recent hospitalization UDS + for cocaine). Most recent echo 3/24 EF 55-60%, RV not well visualized.    She has had multiple admissions in the past for a/c diastolic heart failure. Management of volume status has been difficult, c/b CKD IIIb. Assessment of volume status has been difficult due to obesity/body habitus. She has also had several admissions for AKI and has required multiple readjustments of diuretics. After her most recent hospitalization she was referred to [facility] clinic and from there referred to the [facility] for RHC.   Outpatient RHC was done on 05/12/23 and demonstrated elevated right > left heart filling pressures, mixed pulmonary venous/pulmonary arterial hypertension and low cardiac output and low PAPi, suspected primarily RV failure. She was instructed to increase torsemide to 100 qam/ 50 qpm and f/u in the [facility]  Hemodynamics (mmHg) RA mean 19 RV 58/19 PA 60/34, mean 37 PCWP mean 23  Oxygen saturations: PA 49% AO 95%  Cardiac Output (Fick) 4.34  Cardiac Index (Fick) 1.83 PVR 3.2 WU PAPi 1.4  Unfortunately, she was readmitted 6/9 for chest pain and a/c CHF. She had admitted to recent cocaine use the day before. UDS +. Hs trop flat and low level, peaked at  43. CP felt 2/2 possible vasospasm from cocaine use. Diuresis for CHF c/b worsening renal fx. Nephrology consulted. She had repeat RHC done yesterday that demonstrated mildly reduced cardiac output / cardiac index, hemodynamics consistent with RV failure likely from chronic OHS/OSA, moderately elevated PA mean and PVR likely a combination of Group II+III PH (mRA 87m PAP 53/34/43, PCWP 22, CI 2.2, PVR 4 WU, PAPi 1)   Nephrology driving diuretics, currently on 80 IV Lasix daily w/ only 700 cc in UOP yesterday. Scr trending up, 1.89>>2.06>>2.21, BUN 132. K 4.2     RHC 05/22/22 RA:                  18 mmHg (mean) RV:                  18/10-20 mmHg PA:                  53/34 mmHg (43 mean) PCWP:            22 mmHg (mean)                                      Estimated Fick CO/CI   5.2 L/min, 2.2 L/min/m2                                              TPG  21  mmHg                                            PVR                 4 Wood Units  PAPi                1       Review of Systems: [y] = yes, [ ]  = no   General: Weight gain [ ] ; Weight loss [ ] ; Anorexia [ ] ; Fatigue [ ] ; Fever [ ] ; Chills [ ] ; Weakness [ ]   Cardiac: Chest pain/pressure [ ] ; Resting SOB [ Y]; Exertional SOB [ Y; Orthopnea [ ] ; Pedal Edema [ Y]; Palpitations [ ] ; Syncope [ ] ; Presyncope [ ] ; Paroxysmal nocturnal dyspnea[ ]   Pulmonary: Cough [ ] ; Wheezing[ ] ; Hemoptysis[ ] ; Sputum [ ] ; Snoring [ ]   GI: Vomiting[ ] ; Dysphagia[ ] ; Melena[ ] ; Hematochezia [ ] ; Heartburn[ ] ; Abdominal pain [ ] ; Constipation [ ] ; Diarrhea [ ] ; BRBPR [ ]   GU: Hematuria[ ] ; Dysuria [ ] ; Nocturia[ ]   Vascular: Pain in legs with walking [ ] ; Pain in feet with lying flat [ ] ; Non-healing sores [ ] ; Stroke [ ] ; TIA [ ] ; Slurred speech [ ] ;  Neuro: Headaches[ ] ; Vertigo[ ] ; Seizures[ ] ; Paresthesias[ ] ;Blurred vision [ ] ; Diplopia [ ] ; Vision changes [ ]   Ortho/Skin: Arthritis [ ] ; Joint pain [ ] ; Muscle pain [ ] ; Joint swelling [ ] ; Back  Pain [ ] ; Rash [ ]   Psych: Depression[ ] ; Anxiety[ ]   Heme: Bleeding problems [ ] ; Clotting disorders [ ] ; Anemia [ ]   Endocrine: Diabetes [Y ]; Thyroid dysfunction[ ]   Home Medications Prior to Admission medications   Medication Sig Start Date End Date Taking? Authorizing Provider  acetaminophen (TYLENOL) 325 MG tablet Take 1-2 tablets (325-650 mg total) by mouth every 4 (four) hours as needed for mild pain. 06/04/22  Yes Setzer, Lynnell Jude, PA-C  albuterol (VENTOLIN HFA) 108 (90 Base) MCG/ACT inhaler Inhale 2 puffs into the lungs every 6 (six) hours as needed for wheezing or shortness of breath. 03/28/23  Yes Latrelle Dodrill, MD  amLODipine (NORVASC) 10 MG tablet Take 1 tablet (10 mg total) by mouth daily. 09/27/22  Yes Latrelle Dodrill, MD  aspirin EC 81 MG tablet Take 1 tablet (81 mg total) by mouth daily. Swallow whole. 11/19/20  Yes Tonny Bollman, MD  Brinzolamide-Brimonidine [facility]) 1-0.2 % SUSP Place 1 drop into both eyes in the morning, at noon, and at bedtime.   Yes [provider]  budesonide-formoterol (SYMBICORT) 160-4.5 MCG/ACT inhaler Inhale 2 puffs into the lungs 2 (two) times daily. 03/01/23  Yes Latrelle Dodrill, MD  cetirizine (ZYRTEC) 10 MG tablet Take 1 tablet (10 mg total) by mouth daily as needed for allergies. 02/11/22  Yes Latrelle Dodrill, MD  diclofenac Sodium (VOLTAREN) 1 % GEL Apply 2 g topically 4 (four) times daily. Patient taking differently: Apply 2 g topically 4 (four) times daily as needed (joint pain). 02/26/23  Yes Fayette Pho, MD  DULoxetine (CYMBALTA) 60 MG capsule TAKE 2 CAPSULES BY MOUTH DAILY 03/28/23  Yes Latrelle Dodrill, MD  gabapentin (NEURONTIN) 300 MG capsule Take 2 capsules (600 mg total) by mouth at bedtime. 04/29/23 05/29/23 Yes Gherghe, Daylene Katayama, MD  insulin aspart (NOVOLOG) 100 UNIT/ML injection  Inject 45 Units into the skin 3 (three) times daily before meals.   Yes [provider]  ipratropium-albuterol  (DUONEB) 0.5-2.5 (3) MG/3ML SOLN Take 3 mLs by nebulization every 6 (six) hours as needed. Patient taking differently: Take 3 mLs by nebulization every 6 (six) hours as needed (For shortness of breath). 09/24/22  Yes Charlott Holler, MD  latanoprost (XALATAN) 0.005 % ophthalmic solution Place 1 drop into both eyes daily. 02/04/23  Yes [provider]  metolazone (ZAROXOLYN) 5 MG tablet Take 1 tablet (5 mg total) by mouth every Monday, Wednesday, and Friday. 04/29/23  Yes Leatha Gilding, MD  Multiple Vitamins-Minerals (ONE-A-DAY WOMENS) tablet Take 1 tablet by mouth daily.   Yes [provider]  nitroGLYCERIN (NITROSTAT) 0.4 MG SL tablet Place 1 tablet (0.4 mg total) under the tongue every 5 (five) minutes as needed for chest pain. 11/04/22  Yes Latrelle Dodrill, MD  nystatin (MYCOSTATIN/NYSTOP) powder Apply 1 Application topically 3 (three) times daily. 03/07/23  Yes Latrelle Dodrill, MD  omeprazole (PRILOSEC) 40 MG capsule Take 1 capsule (40 mg total) by mouth in the morning and at bedtime. 04/07/23  Yes Charlott Holler, MD  ondansetron (ZOFRAN-ODT) 4 MG disintegrating tablet Take 1 tablet (4 mg total) by mouth every 8 (eight) hours as needed for nausea or vomiting. 04/21/23  Yes Alicia Amel, MD  potassium chloride SA (KLOR-CON M) 20 MEQ tablet Take 1 tablet (20 mEq total) by mouth daily. 05/12/23  Yes Laurey Morale, MD  prednisoLONE acetate (PRED FORTE) 1 % ophthalmic suspension Place 1 drop into both eyes 4 (four) times daily. 04/20/23  Yes [provider]  promethazine (PHENERGAN) 12.5 MG tablet Take 1 tablet (12.5 mg total) by mouth every 6 (six) hours as needed for nausea or vomiting. 04/29/23  Yes Gherghe, Costin M, MD  RHOPRESSA 0.02 % SOLN Place 1 drop into the left eye daily. 01/21/23  Yes [provider]  rOPINIRole (REQUIP) 1 MG tablet Take 1 tablet (1 mg total) by mouth at bedtime. 12/16/22  Yes Latrelle Dodrill, MD  rosuvastatin (CRESTOR) 10 MG  tablet Take 1 tablet (10 mg total) by mouth daily. 04/14/23  Yes Alfredo Martinez, MD  torsemide (DEMADEX) 100 MG tablet Take 1 tablet (100 mg total) by mouth every morning AND 0.5 tablets (50 mg total) every evening. 05/12/23  Yes Laurey Morale, MD  traZODone (DESYREL) 100 MG tablet TAKE 1 TABLET BY MOUTH AT BEDTIME AS NEEDED FOR SLEEP Patient taking differently: Take 100 mg by mouth at bedtime as needed for sleep. for sleep 03/03/23  Yes Latrelle Dodrill, MD  Vitamin D, Cholecalciferol, 25 MCG (1000 UT) CAPS Take 1,000 Units by mouth daily. 10/05/22  Yes Latrelle Dodrill, MD  Continuous Blood Gluc Sensor (DEXCOM G6 SENSOR) MISC Inject 1 applicator into the skin as directed. Change sensor every 10 days. 11/05/21   Latrelle Dodrill, MD  Continuous Blood Gluc Transmit (DEXCOM G6 TRANSMITTER) MISC Inject 1 Device into the skin as directed. Reuse 8 times with sensor changes. 09/03/21   Latrelle Dodrill, MD  GNP ULTICARE PEN NEEDLES 32G X 4 MM MISC USE TO inject insulin 2 TIMES DAILY 10/02/21   Latrelle Dodrill, MD  insulin degludec (TRESIBA FLEXTOUCH) 200 UNIT/ML FlexTouch Pen Inject 70 Units into the skin 2 (two) times daily. 04/14/23   Alfredo Martinez, MD    Past Medical History: Past Medical History:  Diagnosis Date   Abscess of groin, left  Acute on chronic diastolic (congestive) heart failure ([name]) 02/05/2022   Acute on chronic respiratory failure with hypoxia ([name]) 04/11/2022   Acute osteomyelitis of ankle or foot, left ([name]) 01/31/2018   Acute respiratory failure with hypoxia and hypercarbia ([name]) 06/15/2020   Acute tubular necrosis ([name]) 07/12/2022   Alveolar hypoventilation    Amputation stump infection ([name]) 04/09/2018   Anemia    not on iron pill   Asthma    Bipolar 2 disorder ([name])    Candidiasis of vagina 05/15/2022   Carpal tunnel syndrome on right    recurrent   Cellulitis 08/2010-08/2011   Chest pain with low risk for cardiac etiology 05/11/2021   Chronic pain     Cocaine abuse ([name])    COPD (chronic obstructive pulmonary disease) ([name])    Symbicort daily and Proventil as needed   Costochondritis    De Quervain's tenosynovitis, bilateral 11/01/2015   Depression    Diabetes mellitus type II, uncontrolled 2000   Type 2, Uncontrolled.Takes Lantus daily.Fasting blood sugar runs 150   Diabetic ulcer of right foot ([name]) 12/19/2021   Drug-seeking behavior    Elevated troponin    Exposure to mold 04/21/2020   Family history of metabolic acidosis with increased anion gap 04/12/2023   GERD (gastroesophageal reflux disease)    takes Pantoprazole and Zantac daily   HLD (hyperlipidemia)    takes Atorvastatin daily   Hypertension    takes Lisinopril and Coreg daily   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  Mild diffuse proximal LAD stenosis with no evidence of obstructive disease (20 to 30% diffuse stenosis);  2.  Left dominant circumflex with widely patent obtuse marginal branches and left PDA branch, no significant stenoses present  3.  Nondominant RCA with mild diffuse nonobstructive stenosis    OSA on CPAP    Peripheral neuropathy    takes Gabapentin daily   Pneumonia    "walking" several yrs ago and as a baby (12/05/2018)   Pneumonia due to COVID-19 virus 04/14/2021   Primary osteoarthritis of first carpometacarpal joint of left hand 07/30/2016   Rectal fissure    Restless leg    Right carpal tunnel syndrome 09/01/2011   SVT (supraventricular tachycardia)    Syncope 02/25/2016   Thrombocytosis 04/11/2022   Urinary frequency    Urinary incontinence 10/23/2020   UTI (urinary tract infection) 04/15/2021   Varicose veins    Right medial thigh and Left leg     Past Surgical  History: Past Surgical History:  Procedure Laterality Date   AMPUTATION Left 02/01/2018   Procedure: LEFT FOURTH AND 5TH TOE RAY AMPUTATION;  Surgeon: Nadara Mustard, MD;  Location: [facility] OR;  Service: Orthopedics;  Laterality: Left;   AMPUTATION Left 03/03/2018   Procedure: LEFT BELOW KNEE AMPUTATION;  Surgeon: Nadara Mustard, MD;  Location: [facility] OR;  Service: Orthopedics;  Laterality: Left;   AMPUTATION Right 05/21/2022   Procedure: REVISION AMPUTATION RAY 1ST;  Surgeon: Candelaria Stagers, DPM;  Location: [facility] OR;  Service: Podiatry;  Laterality: Right;   CARPAL TUNNEL RELEASE Bilateral    CESAREAN SECTION  2007   CORONARY ANGIOGRAPHY N/A 11/26/2020   Procedure: CORONARY ANGIOGRAPHY;  Surgeon: Tonny Bollman, MD;  Location: [facility] INVASIVE CV LAB;  Service: Cardiovascular;  Laterality: N/A;   FOOT AMPUTATION Right    INCISION AND DRAINAGE ABSCESS Left 10/16/2021   Procedure: INCISION AND DRAINAGE ABSCESS;  Surgeon: Fritzi Mandes, MD;  Location: [facility] OR;  Service: General;  Laterality: Left;   INCISION AND DRAINAGE PERIRECTAL ABSCESS Left 05/18/2019   Procedure: IRRIGATION AND DEBRIDEMENT OF PANNIS ABSCESS, POSSIBLE DEBRIDEMENT OF BUTTOCK WOUND;  Surgeon: Manus Rudd, MD;  Location: [facility] OR;  Service: General;  Laterality: Left;   IRRIGATION AND DEBRIDEMENT BUTTOCKS Left 05/17/2019   Procedure: IRRIGATION AND DEBRIDEMENT BUTTOCKS;  Surgeon: Manus Rudd, MD;  Location: [facility] OR;  Service: General;  Laterality: Left;   KNEE ARTHROSCOPY Right 07/17/2010   LEFT HEART CATHETERIZATION WITH CORONARY ANGIOGRAM N/A 07/27/2012   Procedure: LEFT HEART CATHETERIZATION WITH CORONARY ANGIOGRAM;  Surgeon: Tonny Bollman, MD;  Location: [facility] CATH LAB;  Service: Cardiovascular;  Laterality: N/A;   MASS EXCISION N/A 06/29/2013   Procedure:  WIDE LOCAL EXCISION OF POSTERIOR NECK ABSCESS;  Surgeon: Axel Filler, MD;  Location: [facility] OR;  Service: General;  Laterality: N/A;   REPAIR KNEE LIGAMENT Left    "fixed ligaments and  chipped patella"   RIGHT HEART CATH N/A 05/12/2023   Procedure: RIGHT HEART CATH;  Surgeon: Laurey Morale, MD;  Location: [facility] INVASIVE CV LAB;  Service: Cardiovascular;  Laterality: N/A;   RIGHT HEART CATH N/A 05/23/2023   Procedure: RIGHT HEART CATH;  Surgeon: Dorthula Nettles, DO;  Location: [facility] INVASIVE CV LAB;  Service: Cardiovascular;  Laterality: N/A;   right transmetatarsal amputation      TRACHEOSTOMY TUBE PLACEMENT N/A 07/10/2022   Procedure: CONTROLLED EXTUBATION;  Surgeon: Serena Colonel, MD;  Location: [facility] OR;  Service: ENT;  Laterality: N/A;    Family History: Family History  Problem Relation Age of Onset   Diabetes Mother    Hyperlipidemia Mother    Depression Mother    GER disease Mother    Allergic rhinitis Mother    Restless legs syndrome Mother    Heart attack Paternal Uncle    Heart disease Paternal Grandmother    Heart attack Paternal Grandmother    Heart attack Paternal Grandfather    Heart disease Paternal Grandfather    Heart attack Father    Migraines Sister    Cancer Maternal Grandmother        COLON   Hypertension Maternal Grandmother    Hyperlipidemia Maternal Grandmother    Diabetes Maternal Grandmother    Other Maternal Grandfather        GUN SHOT   Anxiety disorder Sister    Asthma Child     Social History: Social History   Socioeconomic History   Marital status: Divorced    Spouse name: Not on file   Number of children: 1   Years of education: Not on file   Highest education level: 10th grade  Occupational History    Comment: SSI  Tobacco Use   Smoking status: Former    Packs/day: 0.25    Years: 15.00    Additional pack years: 0.00    Total pack years: 3.75    Types: Cigarettes    Quit date: 12/06/2005    Years since quitting: 17.4   Smokeless tobacco: Never   Tobacco comments:    smokes for a couple of months  Vaping Use   Vaping Use: Never used  Substance and Sexual Activity   Alcohol use: Not Currently   Drug use: Yes     Types: "Crack" cocaine, Cocaine  Comment: do when stressed out and in pain.   Sexual activity: Yes    Partners: Male  Other Topics Concern   Not on file  Social History Narrative   Lives in [place] with her fiance and 45 yr old dtr.   Social Determinants of Health   Financial Resource Strain: High Risk (04/15/2023)   Overall Financial Resource Strain (CARDIA)    Difficulty of Paying Living Expenses: Hard  Food Insecurity: Food Insecurity Present (05/15/2023)   Hunger Vital Sign    Worried About Running Out of Food in the Last Year: Often true    Ran Out of Food in the Last Year: Sometimes true  Transportation Needs: No Transportation Needs (05/15/2023)   PRAPARE - Administrator, Civil Service (Medical): No    Lack of Transportation (Non-Medical): No  Physical Activity: Not on file  Stress: Stress Concern Present (02/19/2021)   [employer] of Occupational Health - Occupational Stress Questionnaire    Feeling of Stress : Very much  Social Connections: Not on file    Allergies:  Allergies  Allergen Reactions   Cefepime Other (See Comments)    AKI, see records from [facility] hospitalization in January 2020.   Other reaction(s): Other (See Comments) "Shut down organs/kidneys"  Note pt has tolerated Rocephin and Keflex Other reaction(s): Not available   Iodine Other (See Comments)    Kidney dysfunction   Pentoxifylline Nausea And Vomiting    Trental   Cephalosporins Other (See Comments)    Pt states that they cause her kidneys to shut down    Objective:    Vital Signs:   Temp:  [97.6 F (36.4 C)-98.5 F (36.9 C)] 98 F (36.7 C) (06/18 0730) Pulse Rate:  [77-102] 77 (06/18 0730) Resp:  [14-19] 18 (06/18 0730) BP: (104-135)/(53-82) 132/82 (06/18 0730) SpO2:  [95 %-98 %] 95 % (06/18 0730) Weight:  [158.1 kg] 158.1 kg (06/18 0410) Last BM Date : 05/20/23  Weight change: Filed Weights   05/22/23 0300 05/23/23 0533 05/24/23 0410  Weight: (!) 154.6 kg (!) 161.6  kg (!) 158.1 kg    Intake/Output:   Intake/Output Summary (Last 24 hours) at 05/24/2023 1019 Last data filed at 05/23/2023 2200 Gross per 24 hour  Intake 1316 ml  Output 700 ml  Net 616 ml      Physical Exam    General:  chronically ill, super morbidly obese female  HEENT: normal Neck: supple. Thick neck, unable to visualize JVD Carotids 2+ bilat; no bruits. No lymphadenopathy or thyromegaly appreciated. Cor: PMI nondisplaced. Regular rate & rhythm. No rubs, gallops or murmurs. Lungs: distal breath sounds due to body habitus  Abdomen:obese, soft, nontender, non distended, large panus. No hepatosplenomegaly. No bruits or masses. Good bowel sounds. Extremities: no cyanosis, clubbing, rash, Lt BKA, Rt TMT amputations  Neuro: alert & orientedx3, cranial nerves grossly intact. moves all 4 extremities w/o difficulty. Affect pleasant   Telemetry   NSR 80s   EKG    NSR 90 bpm   Labs   Basic Metabolic Panel: Recent Labs  Lab 05/20/23 0207 05/21/23 0143 05/22/23 0227 05/23/23 0226 05/23/23 0941 05/24/23 0221  NA 136 134* 131* 129* 133*  132* 131*  K 3.5 3.4* 3.4* 3.8 4.1  4.1 4.2  CL 86* 85* 88* 89*  --  91*  CO2 36* 30 27 25   --  25  GLUCOSE 281* 319* 337* 329*  --  371*  BUN 119* 129* 124* 129*  --  132*  CREATININE 1.87* 2.02* 1.89* 2.06*  --  2.21*  CALCIUM 9.5 9.7 9.1 8.9  --  9.1  MG 2.0 2.0 1.8 1.7  --  2.0  PHOS 4.7* 5.1* 4.5 4.8*  --  5.5*    Liver Function Tests: Recent Labs  Lab 05/20/23 0207 05/21/23 0143 05/22/23 0227 05/23/23 0226 05/24/23 0221  AST  --   --   --   --  19  ALT  --   --   --   --  25  ALKPHOS  --   --   --   --  163*  BILITOT  --   --   --   --  0.5  PROT  --   --   --   --  7.9  ALBUMIN 2.9* 3.0* 3.0* 2.9* 2.9*   No results for input(s): "LIPASE", "AMYLASE" in the last 168 hours. No results for input(s): "AMMONIA" in the last 168 hours.  CBC: Recent Labs  Lab 05/18/23 0119 05/19/23 0130 05/22/23 0227 05/23/23 0226  05/23/23 0941 05/24/23 0221  WBC 9.4 8.7 11.6* 12.1*  --  12.1*  NEUTROABS  --   --   --  7.1  --  6.4  HGB 10.3* 10.6* 10.8* 10.6* 11.9*  11.9* 11.0*  HCT 32.0* 33.0* 32.7* 32.3* 35.0*  35.0* 33.4*  MCV 89.4 90.9 91.3 90.0  --  89.5  PLT 333 344 296 294  --  285    Cardiac Enzymes: No results for input(s): "CKTOTAL", "CKMB", "CKMBINDEX", "TROPONINI" in the last 168 hours.  BNP: BNP (last 3 results) Recent Labs    04/12/23 1231 04/25/23 1856 05/14/23 2240  BNP 19.5 22.8 19.3    ProBNP (last 3 results) No results for input(s): "PROBNP" in the last 8760 hours.   CBG: Recent Labs  Lab 05/23/23 0736 05/23/23 1222 05/23/23 1620 05/23/23 2131 05/24/23 0737  GLUCAP 341* 404* 338* 336* 405*    Coagulation Studies: No results for input(s): "LABPROT", "INR" in the last 72 hours.   Imaging   No results found.   Medications:     Current Medications:  acetaminophen  325 mg Oral Q4H   amLODipine  10 mg Oral Daily   aspirin EC  81 mg Oral Daily   atorvastatin  80 mg Oral Daily   brinzolamide  1 drop Both Eyes TID   And   brimonidine  1 drop Both Eyes TID   enoxaparin (LOVENOX) injection  80 mg Subcutaneous Q24H   furosemide  80 mg Intravenous Daily   gabapentin  300 mg Oral QHS   insulin aspart  0-20 Units Subcutaneous TID WC   insulin aspart  0-5 Units Subcutaneous QHS   insulin aspart  35 Units Subcutaneous TID AC   insulin glargine-yfgn  80 Units Subcutaneous BID   latanoprost  1 drop Both Eyes Daily   lidocaine  1 patch Transdermal Q24H   mometasone-formoterol  2 puff Inhalation BID   pantoprazole  40 mg Oral Daily   prednisoLONE acetate  1 drop Both Eyes QID   prochlorperazine  10 mg Oral Once   rOPINIRole  1 mg Oral QHS   sodium chloride flush  3 mL Intravenous Q12H    Infusions:  sodium chloride        Patient Profile   47 y.o. female with a hx of super morbid obesity, uncontrolled diabetes (most recent Hgb A1c 13, down from >15.5),  peripheral arterial disease with left leg BKA and right transmetatarsal amputations, obesity  hypoventilation syndrome, chronic hypoxic respiratory failure on chronic home O2 (3-4L/min b/l), BiPAP at night, chronic diastolic heart failure w/ prominent RV failure due to PH, CKD IIIB-CKD V and h/o substance use (cocaine and opioids), admitted w/ a/c RV failure, c/b progressive renal failure.   Assessment/Plan   1. Acute on Chronic Diastolic Heart Failure w/ Prominent RV Dysfunction  - Echo 3/24 EF 55-60%, RV not well visualized - RHC this admit c/w RV failure w/ marginal cardiac output, likely from chronic OHS/OSA (PAPi 1, mRA 18, PA 53/34, PCWP 22, CI 2.2) - Diuresis sluggish, SCr increasing, 1.89>>2.06>>2.21, BUN 132. - can consider addition of milrinone for RV support and to augment diuresis. Hopefully venous renal decongestion can help improve renal fx - no PICC as she may need HD   - no digoxin w/ CKD  - not candidate for SGLTi w/ poorly controlled DM and body habitus/ risk for GU infections   2. Pulmonary HTN - RHC per above, suspect 2/2 chronic OHS/OSA - continue supp O2 support, BiPAP at night + diuretics - needs wt loss, consider GLP1 at d/c   3. A/c CKD IIIb - underlying diabetic nephropathy c/b cardiorenal syndrome  - b/l Scr ~ 1.8, has increased to 2.2 w/ attempts at diuresis, RHC w/ marginal output - nephrology following, likely nearing need for HD. BUN 132 today but no current uremic symptoms. K ok at 4.2  - can consider trial of milrinone per above, she has BP room   4. Polysubstance Abuse - UDS + for cocaine  - imperative to quit   5. IDA - likely anemia of chronic disease from CKD  - Tsat 10%  - received 4 doses of IV Fe   6. Super Morbid Obesity  Body mass index is 63.74 kg/m. - needs wt loss  - consider GLP1 post d/c   7. Poorly Controlled T2DM w/ Complications - w/ CKD, retinopathy, HF and b/l LE amputations  - Recent Hgb A1c 13 on Insulin  - management per  IM  - followed by outpatient endocrinology   8. OHS/OSA - supp daytime O2 + BiPAP at bedtime - followed by outpatient pulmonology    Length of Stay: [address], PA-C  05/24/2023, 10:19 AM  Advanced Heart Failure Team Pager [phone] (M-F; 7a - 5p)  Please contact [facility] for night-coverage after hours (4p -7a ) and weekends on [email or web address]

## 2023-05-24 NOTE — Assessment & Plan Note (Addendum)
VSS, pain improved. Patient has chronic history of chest pain likely multifactorial due to cocaine use/MSK; however, is very high risk for ACS.  -Appreciate HF team recs, diuresis as noted -cardiac monitoring -ASA, statin 

## 2023-05-25 ENCOUNTER — Telehealth (HOSPITAL_COMMUNITY): Payer: Self-pay | Admitting: Licensed Clinical Social Worker

## 2023-05-25 ENCOUNTER — Inpatient Hospital Stay (HOSPITAL_COMMUNITY): Payer: 59

## 2023-05-25 DIAGNOSIS — I5033 Acute on chronic diastolic (congestive) heart failure: Secondary | ICD-10-CM | POA: Diagnosis not present

## 2023-05-25 HISTORY — PX: IR FLUORO GUIDE CV LINE RIGHT: IMG2283

## 2023-05-25 HISTORY — PX: IR US GUIDE VASC ACCESS RIGHT: IMG2390

## 2023-05-25 LAB — BASIC METABOLIC PANEL
Anion gap: 17 — ABNORMAL HIGH (ref 5–15)
BUN: 134 mg/dL — ABNORMAL HIGH (ref 6–20)
CO2: 22 mmol/L (ref 22–32)
Calcium: 9.3 mg/dL (ref 8.9–10.3)
Chloride: 91 mmol/L — ABNORMAL LOW (ref 98–111)
Creatinine, Ser: 2.15 mg/dL — ABNORMAL HIGH (ref 0.44–1.00)
GFR, Estimated: 28 mL/min — ABNORMAL LOW (ref >60)
Glucose, Bld: 366 mg/dL — ABNORMAL HIGH (ref 70–99)
Potassium: 4.2 mmol/L (ref 3.5–5.1)
Sodium: 130 mmol/L — ABNORMAL LOW (ref 135–145)

## 2023-05-25 LAB — PHOSPHORUS: Phosphorus: 5.8 mg/dL — ABNORMAL HIGH (ref 2.5–4.6)

## 2023-05-25 LAB — GLUCOSE, CAPILLARY
Glucose-Capillary: 377 mg/dL — ABNORMAL HIGH (ref 70–99)
Glucose-Capillary: 384 mg/dL — ABNORMAL HIGH (ref 70–99)
Glucose-Capillary: 355 mg/dL — ABNORMAL HIGH (ref 70–99)
Glucose-Capillary: 251 mg/dL — ABNORMAL HIGH (ref 70–99)
Glucose-Capillary: 334 mg/dL — ABNORMAL HIGH (ref 70–99)

## 2023-05-25 LAB — MAGNESIUM: Magnesium: 1.9 mg/dL (ref 1.7–2.4)

## 2023-05-25 LAB — LIPOPROTEIN A (LPA): Lipoprotein (a): 53.9 nmol/L — ABNORMAL HIGH (ref <75.0)

## 2023-05-25 LAB — HEPATITIS B SURFACE ANTIGEN: Hepatitis B Surface Ag: NONREACTIVE

## 2023-05-25 MED ORDER — FENTANYL CITRATE (PF) 100 MCG/2ML IJ SOLN
INTRAMUSCULAR | Status: AC | PRN
  Administered 2023-05-25 (×3): 25 ug via INTRAVENOUS

## 2023-05-25 MED ORDER — LIDOCAINE HCL 1 % IJ SOLN
20.0000 mL | Freq: Once | INTRAMUSCULAR | Status: AC
  Administered 2023-05-25: 20 mL

## 2023-05-25 MED ORDER — HYDROMORPHONE HCL 2 MG PO TABS
1.0000 mg | ORAL_TABLET | Freq: Once | ORAL | Status: AC
  Administered 2023-05-25: 1 mg via ORAL
  Filled 2023-05-25: qty 1

## 2023-05-25 MED ORDER — HEPARIN SODIUM (PORCINE) 1000 UNIT/ML IJ SOLN
INTRAMUSCULAR | Status: AC
  Filled 2023-05-25: qty 10

## 2023-05-25 MED ORDER — FENTANYL CITRATE (PF) 100 MCG/2ML IJ SOLN
INTRAMUSCULAR | Status: AC | PRN
  Administered 2023-05-25: 25 ug via INTRAVENOUS

## 2023-05-25 MED ORDER — MIDAZOLAM HCL 2 MG/2ML IJ SOLN
INTRAMUSCULAR | Status: AC | PRN
  Administered 2023-05-25: .5 mg via INTRAVENOUS
  Administered 2023-05-25: 1 mg via INTRAVENOUS

## 2023-05-25 MED ORDER — GELATIN ABSORBABLE 12-7 MM EX MISC
CUTANEOUS | Status: AC
  Filled 2023-05-25: qty 1

## 2023-05-25 MED ORDER — LIDOCAINE HCL 1 % IJ SOLN
INTRAMUSCULAR | Status: AC
  Filled 2023-05-25: qty 20

## 2023-05-25 MED ORDER — ENOXAPARIN SODIUM 80 MG/0.8ML IJ SOSY: 80.0000 mg | PREFILLED_SYRINGE | INTRAMUSCULAR | Status: DC

## 2023-05-25 MED ORDER — CHLORHEXIDINE GLUCONATE CLOTH 2 % EX PADS
6.0000 | MEDICATED_PAD | Freq: Every day | CUTANEOUS | Status: DC
  Administered 2023-05-26: 6 via TOPICAL

## 2023-05-25 MED ORDER — MIDAZOLAM HCL 2 MG/2ML IJ SOLN
INTRAMUSCULAR | Status: AC
  Filled 2023-05-25: qty 2

## 2023-05-25 MED ORDER — MIDAZOLAM HCL 2 MG/2ML IJ SOLN
INTRAMUSCULAR | Status: AC | PRN
  Administered 2023-05-25: .5 mg via INTRAVENOUS

## 2023-05-25 MED ORDER — CEFAZOLIN IN SODIUM CHLORIDE 3-0.9 GM/100ML-% IV SOLN
3.0000 g | INTRAVENOUS | Status: AC
  Administered 2023-05-25: 3 g via INTRAVENOUS
  Filled 2023-05-25 (×2): qty 100

## 2023-05-25 MED ORDER — CHLORHEXIDINE GLUCONATE CLOTH 2 % EX PADS
6.0000 | MEDICATED_PAD | Freq: Every day | CUTANEOUS | Status: DC
  Administered 2023-05-25: 6 via TOPICAL

## 2023-05-25 MED ORDER — SEVELAMER CARBONATE 800 MG PO TABS
800.0000 mg | ORAL_TABLET | Freq: Three times a day (TID) | ORAL | Status: DC
  Administered 2023-05-25 – 2023-05-28 (×9): 800 mg via ORAL
  Filled 2023-05-25 (×9): qty 1

## 2023-05-25 MED ORDER — FUROSEMIDE 10 MG/ML IJ SOLN
80.0000 mg | Freq: Three times a day (TID) | INTRAMUSCULAR | Status: DC
  Administered 2023-05-25 – 2023-05-27 (×7): 80 mg via INTRAVENOUS
  Filled 2023-05-25 (×6): qty 8

## 2023-05-25 MED ORDER — FENTANYL CITRATE (PF) 100 MCG/2ML IJ SOLN
INTRAMUSCULAR | Status: AC
  Filled 2023-05-25: qty 2

## 2023-05-25 MED ORDER — VANCOMYCIN HCL IN DEXTROSE 1-5 GM/200ML-% IV SOLN: 1000.0000 mg | INTRAVENOUS | Status: DC

## 2023-05-25 NOTE — Progress Notes (Signed)
Family Medicine Teaching Service PCP Social Visit  Late entry. Visited with patient socially yesterday 6/18 around 11:45a.  Reviewed her hospitalization thus far and complex interplay between cardiac and renal function. She was in better spirits at the time of my visit than she reportedly had been the day prior.   CHF team PA came in during visit and discussed possibility of using milrinone to enhance perfusion of kidneys.  I compassionately discussed the importance of abstaining from cocaine moving forward. She is motivated to quit and has received info on community resources for substance use disorder.  [name] was very appreciative of the visit. I have been [name]'s PCP for the last 10 years and she is very dear to me. Please contact me if I can be of assistance at all in her care.   Levert Feinstein, MD [facility]

## 2023-05-25 NOTE — Assessment & Plan Note (Addendum)
Poorly controlled DM with most recent A1c of 13.5 a month ago. Home meds include Tresiba 70 units twice daily and NovoLog 45u 3 times daily. Glucoses continue to be elevated. -Semglee 80u BID -Novolog continue to 45u TID WC -Glipizide 2.5mg  daily -at bedtime sliding scale coverage -Resistant SSI with CBGs before every meal and nightly

## 2023-05-25 NOTE — Assessment & Plan Note (Addendum)
BUN stable, but severely elevated. Creatinine 2.15, phos 5.8.  -nephrology consulted, appreciate recs -plan for tunneled catheter with IR, and then dialysis initiation -Start Renvala -monitor renal function -Electrolyte disturbances in the setting of CKD and diuresis, continue to monitor and replete as appropriate

## 2023-05-25 NOTE — Progress Notes (Signed)
Daily Progress Note Intern Pager: [phone]  Patient name: [name] Medical record number: [id number] Date of birth: 19-Apr-1976 Age: 47 y.o. Gender: female  Primary Care Provider: Latrelle Dodrill, MD Consultants: Nephrology, Heart Failure Code Status: Full Code  Pt Overview and Major Events to Date:  6/9 - admitted   Assessment and Plan: [name] is a 47 y.o. female admitted for ACS work-up in the setting of recent RHC, heart failure, significant kidney disease, diabetes, and OSA. Now ongoing evaluation of need for dialysis and continuing medication optimization of cardiac and renal medications.   Hospital Problem List as of 05/25/2023          Priority Resolved POA     1.     Acute on chronic diastolic heart failure ([name]) 1.  Unknown    Current Assessment & Plan 05/14/2023 Hospital Encounter Edited 05/25/2023  7:45 AM by Celine Mans, MD     Last echo 3/24 with EF 55-60%. RHC demonstrating continued RV failure.  -Heart Failure team and nephrology consulted, appreciate recs -Milrinone -diuresis per nephrology/cardiology -Home metalazone -Lasix 80mg  IV BID daily -Mag >2, K>4 -Strict I/os -Daily weights        2.     Azotemia 2.  Unknown    Current Assessment & Plan 05/14/2023 Hospital Encounter Edited 05/25/2023  2:00 PM by Celine Mans, MD     BUN stable, but severely elevated. Creatinine 2.15, phos 5.8.  -nephrology consulted, appreciate recs -plan for tunneled catheter with IR, and then dialysis initiation -Start Renvala -monitor renal function -Electrolyte disturbances in the setting of CKD and diuresis, continue to monitor and replete as appropriate        3.     Type 2 diabetes mellitus with hyperglycemia ([name]) 3.  Yes    Current Assessment & Plan 05/14/2023 Hospital Encounter Edited 05/25/2023  1:59 PM by Celine Mans, MD     Poorly controlled DM with most recent A1c of 13.5 a month ago. Home meds include Tresiba 70 units twice daily and  NovoLog 45u 3 times daily. Glucoses continue to be elevated. -Semglee 80u BID -Novolog continue to 45u TID WC -Glipizide 2.5mg  daily -at bedtime sliding scale coverage -Resistant SSI with CBGs before every meal and nightly        4.     * (Principal) Chest pain 4.  Yes    Current Assessment & Plan 05/14/2023 Hospital Encounter Written 05/25/2023  7:40 AM by Celine Mans, MD     VSS, pain improved. Patient has chronic history of chest pain likely multifactorial due to cocaine use/MSK; however, is very high risk for ACS.  -Appreciate HF team recs, diuresis as noted -cardiac monitoring -ASA, statin        Unprioritized     RVF (right ventricular failure) ([name])   Unknown       FEN/GI: Regular diet PPx: Lovenox Dispo:Pending clinical improvement  Subjective:  NAEO. Patient upset about prognosis and possible dialysis. Would like to visit [facility].  Objective: Temp:  [97.6 F (36.4 C)-98.5 F (36.9 C)] 98.4 F (36.9 C) (06/19 1236) Pulse Rate:  [74-122] 74 (06/19 1236) Resp:  [16-21] 17 (06/19 1236) BP: (91-137)/(60-80) 119/75 (06/19 1236) SpO2:  [90 %-97 %] 90 % (06/19 1236) Weight:  [158.7 kg] 158.7 kg (06/19 0037) Physical Exam: General: NAD, obese, upset Neuro: A&O Cardiovascular: RRR, no murmurs, no peripheral edema Respiratory: normal WOB on RA, CTAB, distant breath sounds Abdomen: soft, NTTP, no rebound or guarding Extremities: Moving  all 4 extremities equally, left BKA   Laboratory: Most recent CBC Lab Results  Component Value Date   WBC 12.1 (H) 05/24/2023   HGB 11.0 (L) 05/24/2023   HCT 33.4 (L) 05/24/2023   MCV 89.5 05/24/2023   PLT 285 05/24/2023   Most recent BMP    Latest Ref Rng & Units 05/25/2023    6:32 AM  BMP  Glucose 70 - 99 mg/dL 829   BUN 6 - 20 mg/dL 562   Creatinine 1.30 - 1.00 mg/dL 8.65   Sodium 784 - 696 mmol/L 130   Potassium 3.5 - 5.1 mmol/L 4.2   Chloride 98 - 111 mmol/L 91   CO2 22 - 32 mmol/L 22   Calcium 8.9 - 10.3  mg/dL 9.3     Phos 5.8  Imaging/Diagnostic Tests: No new imaging.  Celine Mans, MD 05/25/2023, 2:01 PM  PGY-1, [facility] FPTS Intern pager: [phone], text pages welcome Secure chat group [facility] Teaching Service

## 2023-05-25 NOTE — Procedures (Signed)
Interventional Radiology Procedure:   Indications: Acute on chronic kidney disease  Procedure: Placement of tunneled dialysis cathter  Findings: Right jugular Palindrome (23 cm tip to cuff), tip at SVC/RA junction.  Complications: None     EBL: Minimal  Plan: Dialysis catheter is ready to use.   Rashard Ryle R. Lowella Dandy, MD  Pager: [phone]

## 2023-05-25 NOTE — Consult Note (Signed)
Chief Complaint: Patient was seen in consultation today for tunneled dialysis catheter placement Chief Complaint  Patient presents with   Shortness of Breath   Chest Pain   at the request of Dr Azzie Almas  Supervising Physician: Richarda Overlie  Patient Status: [facility] - In-pt  History of Present Illness: Aleita Bruning is a 47 y.o. female   FULL Code status per pt AKI/CKD CHF exacerbation--- continued cocaine use Nephrology requesting tunneled dialysis catheter today  Dr Malen Gauze note today:   Acute kidney Injury on chronic kidney disease stage IIIb: Secondary to acute exacerbation of diastolic heart failure in the setting of continued cocaine abuse.  Lasix was reduced due to rising creatinine per charting.  She remains on metolazone 3 times a week and was recently started on spironolactone 25 mg daily.   - She does not have any acute indications for dialysis however is near the need for dialysis.  Unfortunately, I agree with heart failure team that she has a high risk for complications on long-term intermittent HD    - Heart failure team has added a limited-time trial of milrinone - She has had minimal urine output on the milrinone - She would benefit from renal replacement therapy.  I have reached out to cardiology/advanced CHF to see if they would like to continue the milrinone or not (if so would do CRRT; if stopping then would do intermittent HD) - monitor for renal recovery - re-ordered strict ins/outs  - note that advanced CHF team has also recommended consideration of palliative care consult and I agree We discussed the risks/benefits/indications for dialysis as well as for dialysis catheter placement. She does consent to proceed with renal replacement therapy and understands that I am reaching out to CHF team about HD vs CRRT and about the milrinone question.   Planned for today if IR schedule permits    Past Medical History:  Diagnosis Date   Abscess of groin, left    Acute on  chronic diastolic (congestive) heart failure ([name]) 02/05/2022   Acute on chronic respiratory failure with hypoxia ([name]) 04/11/2022   Acute osteomyelitis of ankle or foot, left ([name]) 01/31/2018   Acute respiratory failure with hypoxia and hypercarbia ([name]) 06/15/2020   Acute tubular necrosis ([name]) 07/12/2022   Alveolar hypoventilation    Amputation stump infection ([name]) 04/09/2018   Anemia    not on iron pill   Asthma    Bipolar 2 disorder ([name])    Candidiasis of vagina 05/15/2022   Carpal tunnel syndrome on right    recurrent   Cellulitis 08/2010-08/2011   Chest pain with low risk for cardiac etiology 05/11/2021   Chronic pain    Cocaine abuse ([name])    COPD (chronic obstructive pulmonary disease) ([name])    Symbicort daily and Proventil as needed   Costochondritis    De Quervain's tenosynovitis, bilateral 11/01/2015   Depression    Diabetes mellitus type II, uncontrolled 2000   Type 2, Uncontrolled.Takes Lantus daily.Fasting blood sugar runs 150   Diabetic ulcer of right foot ([name]) 12/19/2021   Drug-seeking behavior    Elevated troponin    Exposure to mold 04/21/2020   Family history of metabolic acidosis with increased anion gap 04/12/2023   GERD (gastroesophageal reflux disease)    takes Pantoprazole and Zantac daily   HLD (hyperlipidemia)    takes Atorvastatin daily   Hypertension    takes Lisinopril and Coreg daily   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[name]) 04/11/2019   Left shoulder pain 06/23/2019  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  Mild diffuse proximal LAD stenosis with no evidence of obstructive disease (20 to 30% diffuse stenosis);  2.  Left dominant circumflex with widely patent obtuse marginal branches and left PDA branch, no significant stenoses present  3.  Nondominant RCA with mild  diffuse nonobstructive stenosis    OSA on CPAP    Peripheral neuropathy    takes Gabapentin daily   Pneumonia    "walking" several yrs ago and as a baby (12/05/2018)   Pneumonia due to COVID-19 virus 04/14/2021   Primary osteoarthritis of first carpometacarpal joint of left hand 07/30/2016   Rectal fissure    Restless leg    Right carpal tunnel syndrome 09/01/2011   SVT (supraventricular tachycardia)    Syncope 02/25/2016   Thrombocytosis 04/11/2022   Urinary frequency    Urinary incontinence 10/23/2020   UTI (urinary tract infection) 04/15/2021   Varicose veins    Right medial thigh and Left leg     Past Surgical History:  Procedure Laterality Date   AMPUTATION Left 02/01/2018   Procedure: LEFT FOURTH AND 5TH TOE RAY AMPUTATION;  Surgeon: Nadara Mustard, MD;  Location: [facility] OR;  Service: Orthopedics;  Laterality: Left;   AMPUTATION Left 03/03/2018   Procedure: LEFT BELOW KNEE AMPUTATION;  Surgeon: Nadara Mustard, MD;  Location: [place] OR;  Service: Orthopedics;  Laterality: Left;   AMPUTATION Right 05/21/2022   Procedure: REVISION AMPUTATION RAY 1ST;  Surgeon: Candelaria Stagers, DPM;  Location: [facility] OR;  Service: Podiatry;  Laterality: Right;   CARPAL TUNNEL RELEASE Bilateral    CESAREAN SECTION  2007   CORONARY ANGIOGRAPHY N/A 11/26/2020   Procedure: CORONARY ANGIOGRAPHY;  Surgeon: Tonny Bollman, MD;  Location: [facility] INVASIVE CV LAB;  Service: Cardiovascular;  Laterality: N/A;   FOOT AMPUTATION Right    INCISION AND DRAINAGE ABSCESS Left 10/16/2021   Procedure: INCISION AND DRAINAGE ABSCESS;  Surgeon: Fritzi Mandes, MD;  Location: [facility] OR;  Service: General;  Laterality: Left;   INCISION AND DRAINAGE PERIRECTAL ABSCESS Left 05/18/2019   Procedure: IRRIGATION AND DEBRIDEMENT OF PANNIS ABSCESS, POSSIBLE DEBRIDEMENT OF BUTTOCK WOUND;  Surgeon: Manus Rudd, MD;  Location: [facility] OR;  Service: General;  Laterality: Left;   IRRIGATION AND DEBRIDEMENT BUTTOCKS Left 05/17/2019   Procedure:  IRRIGATION AND DEBRIDEMENT BUTTOCKS;  Surgeon: Manus Rudd, MD;  Location: [facility] OR;  Service: General;  Laterality: Left;   KNEE ARTHROSCOPY Right 07/17/2010   LEFT HEART CATHETERIZATION WITH CORONARY ANGIOGRAM N/A 07/27/2012   Procedure: LEFT HEART CATHETERIZATION WITH CORONARY ANGIOGRAM;  Surgeon: Tonny Bollman, MD;  Location: [facility] CATH LAB;  Service: Cardiovascular;  Laterality: N/A;   MASS EXCISION N/A 06/29/2013   Procedure:  WIDE LOCAL EXCISION OF POSTERIOR NECK ABSCESS;  Surgeon: Axel Filler, MD;  Location: [facility] OR;  Service: General;  Laterality: N/A;   REPAIR KNEE LIGAMENT Left    "fixed ligaments and chipped patella"   RIGHT HEART CATH N/A 05/12/2023   Procedure: RIGHT HEART CATH;  Surgeon: Laurey Morale, MD;  Location: [facility] INVASIVE CV LAB;  Service: Cardiovascular;  Laterality: N/A;   RIGHT HEART CATH N/A 05/23/2023   Procedure: RIGHT HEART CATH;  Surgeon: Dorthula Nettles, DO;  Location: [facility] INVASIVE CV LAB;  Service: Cardiovascular;  Laterality: N/A;   right transmetatarsal amputation  TRACHEOSTOMY TUBE PLACEMENT N/A 07/10/2022   Procedure: CONTROLLED EXTUBATION;  Surgeon: Serena Colonel, MD;  Location: [facility] OR;  Service: ENT;  Laterality: N/A;    Allergies: Cefepime, Iodine, Pentoxifylline, and Cephalosporins  Medications: Prior to Admission medications   Medication Sig Start Date End Date Taking? Authorizing Provider  acetaminophen (TYLENOL) 325 MG tablet Take 1-2 tablets (325-650 mg total) by mouth every 4 (four) hours as needed for mild pain. 06/04/22  Yes Setzer, Lynnell Jude, PA-C  albuterol (VENTOLIN HFA) 108 (90 Base) MCG/ACT inhaler Inhale 2 puffs into the lungs every 6 (six) hours as needed for wheezing or shortness of breath. 03/28/23  Yes Latrelle Dodrill, MD  amLODipine (NORVASC) 10 MG tablet Take 1 tablet (10 mg total) by mouth daily. 09/27/22  Yes Latrelle Dodrill, MD  aspirin EC 81 MG tablet Take 1 tablet (81 mg total) by mouth daily. Swallow whole. 11/19/20   Yes Tonny Bollman, MD  Brinzolamide-Brimonidine [facility]) 1-0.2 % SUSP Place 1 drop into both eyes in the morning, at noon, and at bedtime.   Yes [provider]  budesonide-formoterol (SYMBICORT) 160-4.5 MCG/ACT inhaler Inhale 2 puffs into the lungs 2 (two) times daily. 03/01/23  Yes Latrelle Dodrill, MD  cetirizine (ZYRTEC) 10 MG tablet Take 1 tablet (10 mg total) by mouth daily as needed for allergies. 02/11/22  Yes Latrelle Dodrill, MD  diclofenac Sodium (VOLTAREN) 1 % GEL Apply 2 g topically 4 (four) times daily. Patient taking differently: Apply 2 g topically 4 (four) times daily as needed (joint pain). 02/26/23  Yes Fayette Pho, MD  DULoxetine (CYMBALTA) 60 MG capsule TAKE 2 CAPSULES BY MOUTH DAILY 03/28/23  Yes Latrelle Dodrill, MD  gabapentin (NEURONTIN) 300 MG capsule Take 2 capsules (600 mg total) by mouth at bedtime. 04/29/23 05/29/23 Yes Gherghe, Daylene Katayama, MD  insulin aspart (NOVOLOG) 100 UNIT/ML injection Inject 45 Units into the skin 3 (three) times daily before meals.   Yes [provider]  ipratropium-albuterol (DUONEB) 0.5-2.5 (3) MG/3ML SOLN Take 3 mLs by nebulization every 6 (six) hours as needed. Patient taking differently: Take 3 mLs by nebulization every 6 (six) hours as needed (For shortness of breath). 09/24/22  Yes Charlott Holler, MD  latanoprost (XALATAN) 0.005 % ophthalmic solution Place 1 drop into both eyes daily. 02/04/23  Yes [provider]  metolazone (ZAROXOLYN) 5 MG tablet Take 1 tablet (5 mg total) by mouth every Monday, Wednesday, and Friday. 04/29/23  Yes Leatha Gilding, MD  Multiple Vitamins-Minerals (ONE-A-DAY WOMENS) tablet Take 1 tablet by mouth daily.   Yes [provider]  nitroGLYCERIN (NITROSTAT) 0.4 MG SL tablet Place 1 tablet (0.4 mg total) under the tongue every 5 (five) minutes as needed for chest pain. 11/04/22  Yes Latrelle Dodrill, MD  nystatin (MYCOSTATIN/NYSTOP) powder Apply 1 Application  topically 3 (three) times daily. 03/07/23  Yes Latrelle Dodrill, MD  omeprazole (PRILOSEC) 40 MG capsule Take 1 capsule (40 mg total) by mouth in the morning and at bedtime. 04/07/23  Yes Charlott Holler, MD  ondansetron (ZOFRAN-ODT) 4 MG disintegrating tablet Take 1 tablet (4 mg total) by mouth every 8 (eight) hours as needed for nausea or vomiting. 04/21/23  Yes Alicia Amel, MD  potassium chloride SA (KLOR-CON M) 20 MEQ tablet Take 1 tablet (20 mEq total) by mouth daily. 05/12/23  Yes Laurey Morale, MD  prednisoLONE acetate (PRED FORTE) 1 % ophthalmic suspension Place 1 drop into both eyes 4 (four) times  daily. 04/20/23  Yes [provider]  promethazine (PHENERGAN) 12.5 MG tablet Take 1 tablet (12.5 mg total) by mouth every 6 (six) hours as needed for nausea or vomiting. 04/29/23  Yes Gherghe, Costin M, MD  RHOPRESSA 0.02 % SOLN Place 1 drop into the left eye daily. 01/21/23  Yes [provider]  rOPINIRole (REQUIP) 1 MG tablet Take 1 tablet (1 mg total) by mouth at bedtime. 12/16/22  Yes Latrelle Dodrill, MD  rosuvastatin (CRESTOR) 10 MG tablet Take 1 tablet (10 mg total) by mouth daily. 04/14/23  Yes Alfredo Martinez, MD  torsemide (DEMADEX) 100 MG tablet Take 1 tablet (100 mg total) by mouth every morning AND 0.5 tablets (50 mg total) every evening. 05/12/23  Yes Laurey Morale, MD  traZODone (DESYREL) 100 MG tablet TAKE 1 TABLET BY MOUTH AT BEDTIME AS NEEDED FOR SLEEP Patient taking differently: Take 100 mg by mouth at bedtime as needed for sleep. for sleep 03/03/23  Yes Latrelle Dodrill, MD  Vitamin D, Cholecalciferol, 25 MCG (1000 UT) CAPS Take 1,000 Units by mouth daily. 10/05/22  Yes Latrelle Dodrill, MD  Continuous Blood Gluc Sensor (DEXCOM G6 SENSOR) MISC Inject 1 applicator into the skin as directed. Change sensor every 10 days. 11/05/21   Latrelle Dodrill, MD  Continuous Blood Gluc Transmit (DEXCOM G6 TRANSMITTER) MISC Inject 1 Device into the skin as  directed. Reuse 8 times with sensor changes. 09/03/21   Latrelle Dodrill, MD  GNP ULTICARE PEN NEEDLES 32G X 4 MM MISC USE TO inject insulin 2 TIMES DAILY 10/02/21   Latrelle Dodrill, MD  insulin degludec (TRESIBA FLEXTOUCH) 200 UNIT/ML FlexTouch Pen Inject 70 Units into the skin 2 (two) times daily. 04/14/23   Alfredo Martinez, MD     Family History  Problem Relation Age of Onset   Diabetes Mother    Hyperlipidemia Mother    Depression Mother    GER disease Mother    Allergic rhinitis Mother    Restless legs syndrome Mother    Heart attack Paternal Uncle    Heart disease Paternal Grandmother    Heart attack Paternal Grandmother    Heart attack Paternal Grandfather    Heart disease Paternal Grandfather    Heart attack Father    Migraines Sister    Cancer Maternal Grandmother        COLON   Hypertension Maternal Grandmother    Hyperlipidemia Maternal Grandmother    Diabetes Maternal Grandmother    Other Maternal Grandfather        GUN SHOT   Anxiety disorder Sister    Asthma Child     Social History   Socioeconomic History   Marital status: Divorced    Spouse name: Not on file   Number of children: 1   Years of education: Not on file   Highest education level: 10th grade  Occupational History    Comment: SSI  Tobacco Use   Smoking status: Former    Packs/day: 0.25    Years: 15.00    Additional pack years: 0.00    Total pack years: 3.75    Types: Cigarettes    Quit date: 12/06/2005    Years since quitting: 17.4   Smokeless tobacco: Never   Tobacco comments:    smokes for a couple of months  Vaping Use   Vaping Use: Never used  Substance and Sexual Activity   Alcohol use: Not Currently   Drug use: Yes    Types: "Crack"  cocaine, Cocaine    Comment: do when stressed out and in pain.   Sexual activity: Yes    Partners: Male  Other Topics Concern   Not on file  Social History Narrative   Lives in [place] with her fiance and 45 yr old dtr.   Social Determinants  of Health   Financial Resource Strain: High Risk (04/15/2023)   Overall Financial Resource Strain (CARDIA)    Difficulty of Paying Living Expenses: Hard  Food Insecurity: Food Insecurity Present (05/15/2023)   Hunger Vital Sign    Worried About Running Out of Food in the Last Year: Often true    Ran Out of Food in the Last Year: Sometimes true  Transportation Needs: No Transportation Needs (05/15/2023)   PRAPARE - Administrator, Civil Service (Medical): No    Lack of Transportation (Non-Medical): No  Physical Activity: Not on file  Stress: Stress Concern Present (02/19/2021)   [employer] of Occupational Health - Occupational Stress Questionnaire    Feeling of Stress : Very much  Social Connections: Not on file    Review of Systems: A 12 point ROS discussed and pertinent positives are indicated in the HPI above.  All other systems are negative.  Vital Signs: BP 115/60 (BP Location: Right Arm)   Pulse (!) 117   Temp 98.3 F (36.8 C) (Oral)   Resp (!) 21   Ht 5\' 2"  (1.575 m)   Wt (!) 349 lb 13.9 oz (158.7 kg)   SpO2 96%   BMI 63.99 kg/m     Physical Exam HENT:     Mouth/Throat:     Mouth: Mucous membranes are moist.  Cardiovascular:     Rate and Rhythm: Normal rate and regular rhythm.  Pulmonary:     Effort: Pulmonary effort is normal.     Breath sounds: Wheezing present.  Skin:    General: Skin is warm.  Neurological:     Mental Status: She is alert and oriented to person, place, and time.  Psychiatric:        Behavior: Behavior normal.     Imaging: CARDIAC CATHETERIZATION  Result Date: 05/23/2023 HEMODYNAMICS: On 4L [place]. Significant respiratory variation. End expiratory values used. RA:   18 mmHg (mean) RV:   18/10-20 mmHg PA:   53/34 mmHg (43 mean) PCWP:  22 mmHg (mean)    Estimated Fick CO/CI   5.2 L/min, 2.2 L/min/m2    TPG    21  mmHg     PVR     4 Wood Units PAPi      1  IMPRESSION: Mildly reduced cardiac output / cardiac index. 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 Significant respiratory variation during RHC likely due to severe central adiposity. End expiratory values used above. Aditya Sabharwal Advanced Heart Failure Mechanical Circulatory support 10:09 AM  VAS [place] UPPER EXTREMITY VENOUS DUPLEX  Result Date: 05/21/2023 UPPER VENOUS STUDY  Patient Name:  SABELLA YATES  Date of Exam:   05/21/2023 Medical Rec #: [id number]     Accession #:    [id number] Date of Birth: Dec 03, 1976      Patient Gender: F Patient Age:   89 years Exam Location:  [facility] Procedure:      VAS [place] UPPER EXTREMITY VENOUS DUPLEX Referring Phys: MARGARET PRAY --------------------------------------------------------------------------------  Indications: Palpable Cord, and Pain Limitations: Body habitus (BMI 61.53) and Patient unable remain on  back. Comparison Study: No prior study Performing Technologist: Sherren Kerns RVS  Examination Guidelines: A complete evaluation includes B-mode imaging, spectral Doppler, color Doppler, and power Doppler as needed of all accessible portions of each vessel. Bilateral testing is considered an integral part of a complete examination. Limited examinations for reoccurring indications may be performed as noted.  Left Findings: +----------+------------+---------+-----------+----------+---------------------+ LEFT      CompressiblePhasicitySpontaneousProperties       Summary        +----------+------------+---------+-----------+----------+---------------------+ IJV                                                    Not visualized     +----------+------------+---------+-----------+----------+---------------------+ Subclavian               Yes       Yes                                    +----------+------------+---------+-----------+----------+---------------------+ Axillary                 Yes       Yes                                     +----------+------------+---------+-----------+----------+---------------------+ Brachial      Full                                                        +----------+------------+---------+-----------+----------+---------------------+ Radial                                                 Not visualized     +----------+------------+---------+-----------+----------+---------------------+ Ulnar                                                  Not visualized     +----------+------------+---------+-----------+----------+---------------------+ Cephalic      None                                   Acute in forearm at                                                        site of pain and  knotting        +----------+------------+---------+-----------+----------+---------------------+ Basilic                                                Not visualized     +----------+------------+---------+-----------+----------+---------------------+  Summary:  Left: No evidence of deep vein thrombosis in the visualized veins of the upper extremity. Findings consistent with acute superficial vein thrombosis involving the left cephalic vein.  *See table(s) above for measurements and observations.  Diagnosing physician: Heath Lark Electronically signed by Heath Lark on 05/21/2023 at 6:09:29 PM.    Final    DG Chest 2 View  Result Date: 05/14/2023 CLINICAL DATA:  Left-sided chest pain. EXAM: CHEST - 2 VIEW COMPARISON:  None Available. FINDINGS: The heart size and mediastinal contours are within normal limits. Prominence of the perihilar pulmonary vasculature is seen. Low lung volumes are noted. There is no evidence of an acute infiltrate, pleural effusion or pneumothorax. The visualized skeletal structures are unremarkable. IMPRESSION: Low lung volumes with mild pulmonary vascular congestion.  Electronically Signed   By: Aram Candela M.D.   On: 05/14/2023 23:51   CARDIAC CATHETERIZATION  Result Date: 05/12/2023 1. Elevated right > left heart filling pressures. 2. Mixed pulmonary venous/pulmonary arterial hypertension. 3. Low cardiac output and low PAPi, suspect primarily RV failure. Suspect diastolic CHF, pulmonary hypertension due to elevated LA pressure as well as OHS/OSA. Significant RV failure.  Will increase torsemide to 100 qam/50 qpm and add KCl 20 daily.  I worry that she will need HD soon based on renal dysfunction.   MR BRAIN WO CONTRAST  Result Date: 04/27/2023 CLINICAL DATA:  Provided history: Transient ischemic attack (TIA). EXAM: MRI HEAD WITHOUT CONTRAST TECHNIQUE: Multiplanar, multiecho pulse sequences of the brain and surrounding structures were obtained without intravenous contrast. COMPARISON:  Head CT 10/22/2021.  Brain MRI 07/13/2014. FINDINGS: Brain: No age advanced or lobar predominant parenchymal atrophy. There are a few small nonspecific T2 FLAIR hyperintense remote insults scattered within the bilateral cerebral white matter, new from the prior brain MRI of 07/13/2014. No cortical encephalomalacia is identified. There is no acute infarct. No evidence of an intracranial mass. No chronic intracranial blood products. No extra-axial fluid collection. No midline shift. Vascular: Maintained flow voids within the proximal large arterial vessels. Skull and upper cervical spine: No focal suspicious marrow lesion. Sinuses/Orbits: No mass or acute finding within the imaged orbits. 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 Prior bilateral ocular lens replacement. No significant paranasal sinus disease. IMPRESSION: 1. No evidence of an acute intracranial abnormality. 2. There are a few small nonspecific T2 FLAIR hyperintense remote insults within the cerebral white matter, new from the prior brain MRI of 07/13/2014. 3. Otherwise unremarkable non-contrast MRI appearance of the brain.  Electronically Signed   By: Jackey Loge D.O.   On: 04/27/2023 19:25   DG Chest 2 View  Result Date: 04/25/2023 CLINICAL DATA:  Shortness of breath. EXAM: CHEST - 2 VIEW COMPARISON:  04/12/2023. FINDINGS: The heart size and mediastinal contours are within normal limits. Lung volumes are low and pulmonary vascular crowding is noted. No consolidation, effusion, or pneumothorax. Degenerative changes are present in the thoracic spine. No acute osseous abnormality. IMPRESSION: Low lung volumes with no acute process. Electronically Signed   By: Thornell Sartorius M.D.   On: 04/25/2023 22:57    Labs:  CBC: Recent Labs  05/19/23 0130 05/22/23 0227 05/23/23 0226 05/23/23 0941 05/24/23 0221  WBC 8.7 11.6* 12.1*  --  12.1*  HGB 10.6* 10.8* 10.6* 11.9*  11.9* 11.0*  HCT 33.0* 32.7* 32.3* 35.0*  35.0* 33.4*  PLT 344 296 294  --  285    COAGS: Recent Labs    08/15/22 0256 03/19/23 0530  INR 1.0 1.0    BMP: Recent Labs    05/22/23 0227 05/23/23 0226 05/23/23 0941 05/24/23 0221 05/25/23 0632  NA 131* 129* 133*  132* 131* 130*  K 3.4* 3.8 4.1  4.1 4.2 4.2  CL 88* 89*  --  91* 91*  CO2 27 25  --  25 22  GLUCOSE 337* 329*  --  371* 366*  BUN 124* 129*  --  132* 134*  CALCIUM 9.1 8.9  --  9.1 9.3  CREATININE 1.89* 2.06*  --  2.21* 2.15*  GFRNONAA 33* 30*  --  27* 28*    LIVER FUNCTION TESTS: Recent Labs    04/27/23 0439 04/28/23 0426 04/29/23 0434 05/18/23 0119 05/21/23 0143 05/22/23 0227 05/23/23 0226 05/24/23 0221  BILITOT 0.3 0.5 0.5  --   --   --   --  0.5  AST 20 20 19   --   --   --   --  19  ALT 26 24 25   --   --   --   --  25  ALKPHOS 118 110 110  --   --   --   --  163*  PROT 8.8* 8.4* 8.6*  --   --   --   --  7.9  ALBUMIN 3.5 3.3* 3.5   < > 3.0* 3.0* 2.9* 2.9*   < > = values in this interval not displayed.    TUMOR MARKERS: No results for input(s): "AFPTM", "CEA", "CA199", "CHROMGRNA" in the last 8760 hours.  Assessment and Plan:  Scheduled for tunneled  dialysis catheter placement Risks and benefits discussed with the patient including, but not limited to bleeding, infection, vascular injury, pneumothorax which may require chest tube placement, air embolism or even death  All of the patient's questions were answered, patient is agreeable to proceed. Consent signed and in chart.  Thank you for this interesting consult.  I greatly enjoyed meeting Taija Coppage and look forward to participating in their care.  A copy of this report was sent to the requesting provider on this date.  Electronically Signed: Robet Leu, PA-C 05/25/2023, 10:53 AM   I spent a total of 20 Minutes    in face to face in clinical consultation, greater than 50% of which was counseling/coordinating care for tunneled HD catheter placement

## 2023-05-25 NOTE — Assessment & Plan Note (Signed)
VSS, pain improved. Patient has chronic history of chest pain likely multifactorial due to cocaine use/MSK; however, is very high risk for ACS.  -Appreciate HF team recs, diuresis as noted -cardiac monitoring -ASA, statin

## 2023-05-25 NOTE — Progress Notes (Signed)
Advanced Heart Failure Rounding Note  PCP-Cardiologist: Tonny Bollman, MD   Subjective:    Little augmentation in UOP despite the addition of milrinone.   Plan for iHD today. Tearful. No currently resting dyspnea. Family present at bedside.    Objective:   Weight Range: (!) 158.7 kg Body mass index is 63.99 kg/m.   Vital Signs:   Temp:  [97.6 F (36.4 C)-98.5 F (36.9 C)] 98.4 F (36.9 C) (06/19 1236) Pulse Rate:  [74-122] 74 (06/19 1236) Resp:  [16-21] 17 (06/19 1236) BP: (91-137)/(60-80) 119/75 (06/19 1236) SpO2:  [90 %-97 %] 90 % (06/19 1236) Weight:  [158.7 kg] 158.7 kg (06/19 0037) Last BM Date : 05/24/23  Weight change: Filed Weights   05/23/23 0533 05/24/23 0410 05/25/23 0037  Weight: (!) 161.6 kg (!) 158.1 kg (!) 158.7 kg    Intake/Output:   Intake/Output Summary (Last 24 hours) at 05/25/2023 1238 Last data filed at 05/25/2023 1214 Gross per 24 hour  Intake 428.39 ml  Output 700 ml  Net -271.61 ml      Physical Exam    General: super morbidly obese, chronically ill appearing. No resp distress  HEENT: Normal Neck: Supple. Thick neck, up able to visualized JVP. Carotids 2+ bilat; no bruits. No lymphadenopathy or thyromegaly appreciated. Cor: PMI nondisplaced. Regular rate & rhythm. No rubs, gallops or murmurs. Lungs: Clear Abdomen: Soft, nontender, nondistended. No hepatosplenomegaly. No bruits or masses. Good bowel sounds. Extremities: No cyanosis, clubbing, rash, Rt BKA, Lt TMA Neuro: Alert & orientedx3, cranial nerves grossly intact. moves all 4 extremities w/o difficulty. Affect pleasant   Telemetry   NSR 90s   EKG    N/A   Labs    CBC Recent Labs    05/23/23 0226 05/23/23 0941 05/24/23 0221  WBC 12.1*  --  12.1*  NEUTROABS 7.1  --  6.4  HGB 10.6* 11.9*  11.9* 11.0*  HCT 32.3* 35.0*  35.0* 33.4*  MCV 90.0  --  89.5  PLT 294  --  285   Basic Metabolic Panel Recent Labs    16/10/96 0221 05/25/23 0038 05/25/23 0632   NA 131*  --  130*  K 4.2  --  4.2  CL 91*  --  91*  CO2 25  --  22  GLUCOSE 371*  --  366*  BUN 132*  --  134*  CREATININE 2.21*  --  2.15*  CALCIUM 9.1  --  9.3  MG 2.0 1.9  --   PHOS 5.5*  --  5.8*   Liver Function Tests Recent Labs    05/23/23 0226 05/24/23 0221  AST  --  19  ALT  --  25  ALKPHOS  --  163*  BILITOT  --  0.5  PROT  --  7.9  ALBUMIN 2.9* 2.9*   No results for input(s): "LIPASE", "AMYLASE" in the last 72 hours. Cardiac Enzymes No results for input(s): "CKTOTAL", "CKMB", "CKMBINDEX", "TROPONINI" in the last 72 hours.  BNP: BNP (last 3 results) Recent Labs    04/12/23 1231 04/25/23 1856 05/14/23 2240  BNP 19.5 22.8 19.3    ProBNP (last 3 results) No results for input(s): "PROBNP" in the last 8760 hours.   D-Dimer No results for input(s): "DDIMER" in the last 72 hours. Hemoglobin A1C No results for input(s): "HGBA1C" in the last 72 hours. Fasting Lipid Panel No results for input(s): "CHOL", "HDL", "LDLCALC", "TRIG", "CHOLHDL", "LDLDIRECT" in the last 72 hours. Thyroid Function Tests No results for input(s): "  TSH", "T4TOTAL", "T3FREE", "THYROIDAB" in the last 72 hours.  Invalid input(s): "FREET3"  Other results:   Imaging    No results found.   Medications:     Scheduled Medications:  aspirin EC  81 mg Oral Daily   atorvastatin  80 mg Oral Daily   brinzolamide  1 drop Both Eyes TID   And   brimonidine  1 drop Both Eyes TID   [START ON 05/26/2023] Chlorhexidine Gluconate Cloth  6 each Topical Q0600   [START ON 05/26/2023] enoxaparin (LOVENOX) injection  80 mg Subcutaneous Q24H   furosemide  80 mg Intravenous Q8H   gabapentin  300 mg Oral QHS   glipiZIDE  2.5 mg Oral Q breakfast   insulin aspart  0-20 Units Subcutaneous TID WC   insulin aspart  0-5 Units Subcutaneous QHS   insulin aspart  45 Units Subcutaneous TID AC   insulin glargine-yfgn  80 Units Subcutaneous BID   latanoprost  1 drop Both Eyes Daily   lidocaine  1 patch  Transdermal Q24H   mometasone-formoterol  2 puff Inhalation BID   pantoprazole  40 mg Oral Daily   prednisoLONE acetate  1 drop Both Eyes QID   prochlorperazine  10 mg Oral Once   rOPINIRole  1 mg Oral QHS   sevelamer carbonate  800 mg Oral TID WC   sodium chloride flush  3 mL Intravenous Q12H    Infusions:  sodium chloride      ceFAZolin (ANCEF) IV     milrinone 0.25 mcg/kg/min (05/25/23 0752)    PRN Medications: sodium chloride, acetaminophen, albuterol, diclofenac Sodium, ipratropium-albuterol, nitroGLYCERIN, ondansetron (ZOFRAN) IV, mouth rinse, polyethylene glycol, sodium chloride flush    Patient Profile   47 y.o. 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 Acute on Chronic Diastolic Heart Failure w/ Prominent RV Dysfunction  - Echo 3/24 EF 55-60%, RV not well visualized - RHC this admit c/w RV failure w/ marginal cardiac output, likely from chronic OHS/OSA (PAPi 1, mRA 18, PA 53/34, PCWP 22, CI 2.2) - Poor response to IV Lasix and milrinone. SCr 2.15 today, BUN rising 134  - d/w nephrology, will plan trial of HD today. Will continue milrinone for RV support. Hopefully offloading RV w/ HD can help w/ output - no digoxin w/ CKD  - not candidate for SGLTi w/ poorly controlled DM and body habitus/ risk for GU infections    2. Pulmonary HTN - RHC per above, suspect 2/2 chronic OHS/OSA - continue supp O2 support, BiPAP at night + diuretics - needs wt loss, consider GLP1 at d/c    3. A/c CKD IIIb -  underlying diabetic nephropathy c/b cardiorenal syndrome  - plan iHD today per nephrology, IR to place [facility] cath    4. Polysubstance Abuse - UDS + for cocaine  - imperative to quit    5. IDA - likely anemia of chronic disease from CKD  - Tsat 10%  - received 4 doses of IV Fe    6. Super Morbid Obesity  Body mass index is 63.74 kg/m. - needs wt  loss  - consider GLP1 post d/c    7. Poorly Controlled T2DM w/ Complications - w/ CKD, retinopathy, HF and b/l LE amputations  - Recent Hgb A1c 13 on Insulin  - management per IM  - followed by outpatient endocrinology    8. OHS/OSA - supp daytime O2 + BiPAP at bedtime - followed by outpatient pulmonology     Length of Stay: [address] PA-C  05/25/2023, 12:38 PM  Advanced Heart Failure Team Pager [phone] (M-F; 7a - 5p)  Please contact [facility] for night-coverage after hours (5p -7a ) and weekends on [email or web address]

## 2023-05-25 NOTE — Assessment & Plan Note (Addendum)
Last echo 3/24 with EF 55-60%. RHC demonstrating continued RV failure.  -Heart Failure team and nephrology consulted, appreciate recs -Milrinone -diuresis per nephrology/cardiology -Home metalazone -Lasix 80mg  IV BID daily -Mag >2, K>4 -Strict I/os -Daily weights

## 2023-05-25 NOTE — Progress Notes (Signed)
PT Cancellation Note  Patient Details Name: [name] MRN: [id number] DOB: 01/07/1976   Cancelled Treatment:    Reason Eval/Treat Not Completed: (P) Patient at procedure or test/unavailable (Off unit for dialysis cath placement.)   [name] 05/25/2023, 2:49 PM  Bonney Leitz , PTA Acute Rehabilitation Services Office [phone]

## 2023-05-25 NOTE — Inpatient Diabetes Management (Signed)
Inpatient Diabetes Program Recommendations  AACE/ADA: New Consensus Statement on Inpatient Glycemic Control (2015)  Target Ranges:  Prepandial:   less than 140 mg/dL      Peak postprandial:   less than 180 mg/dL (1-2 hours)      Critically ill patients:  140 - 180 mg/dL   Lab Results  Component Value Date   GLUCAP 384 (H) 05/25/2023   HGBA1C 13.6 (H) 04/13/2023    Review of Glycemic Control  Latest Reference Range & Units 05/24/23 07:37 05/24/23 12:10 05/24/23 17:21 05/24/23 20:57 05/24/23 23:54 05/25/23 07:29 05/25/23 08:57  Glucose-Capillary 70 - 99 mg/dL 295 (H) 621 (H) 308 (H) 344 (H) 308 (H) 377 (H) 384 (H)   Diabetes history: DM 2 Outpatient Diabetes medications: Tresiba 70 units bid, Novolog 45 units tid Current orders for Inpatient glycemic control:  Semglee 80 units Daily Novolog 0-20 untist id Novolog 45 units tid meal coverage Glipizide 2.5 mg Daily  A1c 13.6% on 5/8  Inpatient Diabetes Program Recommendations:    -  Recommend IV insulin for 12-24 hours to reset glucose receptors and attempt to redose SQ insulin regimen.   Thanks, Christena Deem RN, MSN, BC-ADM Inpatient Diabetes Coordinator Team Pager [phone] (8a-5p)

## 2023-05-25 NOTE — Assessment & Plan Note (Signed)
Morbidly obese patient on 3-4 L [place] at baseline. Will continue oxygen supplementation and BiPAP nightly -Continue Oxygen supplement -BiPAP nightly 

## 2023-05-25 NOTE — Progress Notes (Signed)
Pt was complaining of left stump pain 10/10. Barb Merino, MD made aware, see new orders in [facility].  Bari Edward, RN

## 2023-05-25 NOTE — Progress Notes (Signed)
Pt in dialysis w/CPAP.

## 2023-05-25 NOTE — Progress Notes (Signed)
Patient ID: [name], female   DOB: [birth year], 47 y.o.   MRN: [id number] [facility] Progress Note   Assessment/ Plan:   1. Acute kidney Injury on chronic kidney disease stage IIIb: Secondary to acute exacerbation of diastolic heart failure in the setting of continued cocaine abuse.  Lasix was reduced due to rising creatinine per charting.  She remains on metolazone 3 times a week and was recently started on spironolactone 25 mg daily.   - She does not have any acute indications for dialysis however is near the need for dialysis.  Unfortunately, I agree with heart failure team that she has a high risk for complications on long-term intermittent HD    - Heart failure team has added a limited-time trial of milrinone - She has had minimal urine output on the milrinone - She would benefit from renal replacement therapy.  I have reached out to cardiology/advanced CHF to see if they would like to continue the milrinone or not (if so would do CRRT; if stopping then would do intermittent HD) - monitor for renal recovery - re-ordered strict ins/outs  - note that advanced CHF team has also recommended consideration of palliative care consult and I agree  2.  Metabolic alkalosis: Appears largely from CO2 retention with her habitus/OHS and exacerbated by diuretics.  Currently improving after earlier was refractory to acetazolamide administration.  3.  Acute exacerbation of diastolic heart failure: s/p right heart catheterization on 6/17 as above.  We have re-educated on the importance of cocaine cessation.  4.  Hypokalemia: Secondary to diuresis.  Improved   5.  Hyperphosphatemia: Secondary to acute kidney injury, will continue to trend phosphorus levels. Renal diabetic diet when taking PO  6.  Insulin-dependent diabetes mellitus: Significant hyperglycemia noted during current hospitalization; management per primary service.  7. HTN - discontinued amlodipine to avoid hypotension  8.  CKD stage 3b - note that her baseline Cr appears to be 1.8 - 2.0  Disposition: would continue inpatient monitoring   Subjective:    Strict ins/outs are not available.  She had two unmeasured urine voids over 6/18 charted and has had 100 mL UOP over 6/18 AM.  She has been on milrinone per the heart failure team.  upon noting her minimal urine output this morning I made her NPO and reached out to her team.  I spoke with her and her mother at bedside today.  We discussed that there are not great options - she may not tolerate dialysis well however she is not tolerating being off of dialysis well - not urinating much and on 4 liters oxygen with persistently markedly elevated BUN.  We discussed the option of not doing dialysis as well and pursuing palliative care.  We discussed the risks/benefits/indications for dialysis as well as for dialysis catheter placement.  She does consent to proceed with renal replacement therapy and understands that I am reaching out to CHF team about HD vs CRRT and about the milrinone question.  Her nurse was at bedside during our conversation.   Review of systems:    Denies chest pain  She has shortness of breath with exertion; she is on supplemental oxygen  Not urinating much     Objective:   BP 115/60 (BP Location: Right Arm)   Pulse (!) 117   Temp 98.3 F (36.8 C) (Oral)   Resp (!) 21   Ht 5\' 2"  (1.575 m)   Wt (!) 158.7 kg   SpO2 96%   BMI  63.99 kg/m   Intake/Output Summary (Last 24 hours) at 05/25/2023 1610 Last data filed at 05/25/2023 0755 Gross per 24 hour  Intake 1148.39 ml  Output 100 ml  Net 1048.39 ml   Weight change: 0.621 kg  Physical Exam:   General adult female in bed in no acute distress, morbidly obese body habitus HEENT normocephalic atraumatic extraocular movements intact sclera anicteric Neck supple trachea midline Lungs reduced breath sounds on auscultation; no wheezing; on 4 liters supplemental oxygen; unlabored at rest Heart S1S2  no rub Abdomen soft nontender obese body habitus with pannus Extremities right foot with transmetatarsal amputation; left BKA; 2+ edema lower extremities Psych normal mood and affect Neuro - alert and oriented; conversant and follows commands   Imaging: CARDIAC CATHETERIZATION  Result Date: 05/23/2023 HEMODYNAMICS: On 4L [place]. Significant respiratory variation. End expiratory values used. RA:   18 mmHg (mean) RV:   18/10-20 mmHg PA:   53/34 mmHg (43 mean) PCWP:  22 mmHg (mean)    Estimated Fick CO/CI   5.2 L/min, 2.2 L/min/m2    TPG    21  mmHg     PVR     4 [place] Units PAPi      1  IMPRESSION: Mildly reduced cardiac output / cardiac index. 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 Significant respiratory variation during RHC likely due to severe central adiposity. End expiratory values used above. Aditya Sabharwal Advanced Heart Failure Mechanical Circulatory support 10:09 AM   Labs: BMET Recent Labs  Lab 05/19/23 0130 05/20/23 0207 05/21/23 0143 05/22/23 0227 05/23/23 0226 05/23/23 0941 05/24/23 0221 05/25/23 0632  NA 138 136 134* 131* 129* 133*  132* 131* 130*  K 3.2* 3.5 3.4* 3.4* 3.8 4.1  4.1 4.2 4.2  CL 83* 86* 85* 88* 89*  --  91* 91*  CO2 36* 36* 30 27 25   --  25 22  GLUCOSE 269* 281* 319* 337* 329*  --  371* 366*  BUN 135* 119* 129* 124* 129*  --  132* 134*  CREATININE 1.87* 1.87* 2.02* 1.89* 2.06*  --  2.21* 2.15*  CALCIUM 9.7 9.5 9.7 9.1 8.9  --  9.1 9.3  PHOS 5.1* 4.7* 5.1* 4.5 4.8*  --  5.5* 5.8*   CBC Recent Labs  Lab 05/19/23 0130 05/22/23 0227 05/23/23 0226 05/23/23 0941 05/24/23 0221  WBC 8.7 11.6* 12.1*  --  12.1*  NEUTROABS  --   --  7.1  --  6.4  HGB 10.6* 10.8* 10.6* 11.9*  11.9* 11.0*  HCT 33.0* 32.7* 32.3* 35.0*  35.0* 33.4*  MCV 90.9 91.3 90.0  --  89.5  PLT 344 296 294  --  285    Medications:     aspirin EC  81 mg Oral Daily    atorvastatin  80 mg Oral Daily   brinzolamide  1 drop Both Eyes TID   And   brimonidine  1 drop Both Eyes TID   enoxaparin (LOVENOX) injection  80 mg Subcutaneous Q24H   furosemide  80 mg Intravenous Q8H   gabapentin  300 mg Oral QHS   glipiZIDE  2.5 mg Oral Q breakfast   insulin aspart  0-20 Units Subcutaneous TID WC   insulin aspart  0-5 Units Subcutaneous QHS   insulin aspart  45 Units Subcutaneous TID AC   insulin glargine-yfgn  80 Units Subcutaneous BID   latanoprost  1 drop Both Eyes Daily  lidocaine  1 patch Transdermal Q24H   mometasone-formoterol  2 puff Inhalation BID   pantoprazole  40 mg Oral Daily   prednisoLONE acetate  1 drop Both Eyes QID   prochlorperazine  10 mg Oral Once   rOPINIRole  1 mg Oral QHS   sodium chloride flush  3 mL Intravenous Q12H   Imaging:  Heart cath on 6/17 with mildly reduced cardiac output / cardiac index and per cardiology her hemodynamics are consistent with RV failure likely from chronic OHS/OSA.    Estanislado Emms, MD 05/25/2023, 9:49 AM

## 2023-05-25 NOTE — Telephone Encounter (Signed)
H&V Care Navigation CSW Progress Note  Clinical Social Worker met with pt in hospital as pt request.  She is out of money and won't get paid until Monday- unable to afford to keep her hotel room and worried about becoming homeless.  CSW able to assist with 1 week rent at extended stay through patient care fund.   SDOH Screenings   Food Insecurity: Food Insecurity Present (05/15/2023)  Housing: Low Risk  (05/15/2023)  Recent Concern: Housing - Medium Risk (04/27/2023)  Transportation Needs: No Transportation Needs (05/15/2023)  Utilities: Not At Risk (05/15/2023)  Alcohol Screen: Low Risk  (04/14/2023)  Depression (PHQ2-9): Medium Risk (03/07/2023)  Financial Resource Strain: High Risk (05/25/2023)  Stress: Stress Concern Present (02/19/2021)  Tobacco Use: Medium Risk (05/24/2023)   Burna Sis, LCSW Clinical Social Worker Advanced Heart Failure Clinic Desk#: [phone] Cell#: [phone]

## 2023-05-25 NOTE — Plan of Care (Signed)

## 2023-05-26 DIAGNOSIS — I5033 Acute on chronic diastolic (congestive) heart failure: Secondary | ICD-10-CM | POA: Diagnosis not present

## 2023-05-26 LAB — RENAL FUNCTION PANEL
Albumin: 2.8 g/dL — ABNORMAL LOW (ref 3.5–5.0)
Anion gap: 17 — ABNORMAL HIGH (ref 5–15)
BUN: 142 mg/dL — ABNORMAL HIGH (ref 6–20)
CO2: 24 mmol/L (ref 22–32)
Calcium: 9 mg/dL (ref 8.9–10.3)
Chloride: 91 mmol/L — ABNORMAL LOW (ref 98–111)
Creatinine, Ser: 2.43 mg/dL — ABNORMAL HIGH (ref 0.44–1.00)
GFR, Estimated: 24 mL/min — ABNORMAL LOW (ref >60)
Glucose, Bld: 294 mg/dL — ABNORMAL HIGH (ref 70–99)
Phosphorus: 6.3 mg/dL — ABNORMAL HIGH (ref 2.5–4.6)
Potassium: 4.2 mmol/L (ref 3.5–5.1)
Sodium: 132 mmol/L — ABNORMAL LOW (ref 135–145)

## 2023-05-26 LAB — BASIC METABOLIC PANEL
Anion gap: 14 (ref 5–15)
BUN: 97 mg/dL — ABNORMAL HIGH (ref 6–20)
CO2: 25 mmol/L (ref 22–32)
Calcium: 8.7 mg/dL — ABNORMAL LOW (ref 8.9–10.3)
Chloride: 92 mmol/L — ABNORMAL LOW (ref 98–111)
Creatinine, Ser: 1.92 mg/dL — ABNORMAL HIGH (ref 0.44–1.00)
GFR, Estimated: 32 mL/min — ABNORMAL LOW (ref >60)
Glucose, Bld: 366 mg/dL — ABNORMAL HIGH (ref 70–99)
Potassium: 4.4 mmol/L (ref 3.5–5.1)
Sodium: 131 mmol/L — ABNORMAL LOW (ref 135–145)

## 2023-05-26 LAB — GLUCOSE, CAPILLARY
Glucose-Capillary: 380 mg/dL — ABNORMAL HIGH (ref 70–99)
Glucose-Capillary: 512 mg/dL (ref 70–99)
Glucose-Capillary: 325 mg/dL — ABNORMAL HIGH (ref 70–99)
Glucose-Capillary: 328 mg/dL — ABNORMAL HIGH (ref 70–99)

## 2023-05-26 LAB — CBC WITH DIFFERENTIAL/PLATELET
Abs Immature Granulocytes: 0.16 10*3/uL — ABNORMAL HIGH (ref 0.00–0.07)
Basophils Absolute: 0.1 10*3/uL (ref 0.0–0.1)
Basophils Relative: 1 %
Eosinophils Absolute: 0.1 10*3/uL (ref 0.0–0.5)
Eosinophils Relative: 1 %
HCT: 29.3 % — ABNORMAL LOW (ref 36.0–46.0)
Hemoglobin: 9.5 g/dL — ABNORMAL LOW (ref 12.0–15.0)
Immature Granulocytes: 1 %
Lymphocytes Relative: 30 %
Lymphs Abs: 3.4 10*3/uL (ref 0.7–4.0)
MCH: 30.1 pg (ref 26.0–34.0)
MCHC: 32.4 g/dL (ref 30.0–36.0)
MCV: 92.7 fL (ref 80.0–100.0)
Monocytes Absolute: 0.9 10*3/uL (ref 0.1–1.0)
Monocytes Relative: 8 %
Neutro Abs: 6.7 10*3/uL (ref 1.7–7.7)
Neutrophils Relative %: 59 %
Platelets: 271 10*3/uL (ref 150–400)
RBC: 3.16 MIL/uL — ABNORMAL LOW (ref 3.87–5.11)
RDW: 16.1 % — ABNORMAL HIGH (ref 11.5–15.5)
WBC: 11.4 10*3/uL — ABNORMAL HIGH (ref 4.0–10.5)
nRBC: 0 % (ref 0.0–0.2)

## 2023-05-26 LAB — HEPATITIS B SURFACE ANTIBODY, QUANTITATIVE: Hep B S AB Quant (Post): <3.5 m[IU]/mL — ABNORMAL LOW (ref >9.9)

## 2023-05-26 LAB — MAGNESIUM
Magnesium: 1.9 mg/dL (ref 1.7–2.4)
Magnesium: 1.8 mg/dL (ref 1.7–2.4)

## 2023-05-26 LAB — PHOSPHORUS: Phosphorus: 4.6 mg/dL (ref 2.5–4.6)

## 2023-05-26 MED ORDER — BACLOFEN 10 MG PO TABS
10.0000 mg | ORAL_TABLET | Freq: Once | ORAL | Status: AC
  Administered 2023-05-26: 10 mg via ORAL
  Filled 2023-05-26: qty 1

## 2023-05-26 MED ORDER — GLIPIZIDE ER 5 MG PO TB24
5.0000 mg | ORAL_TABLET | Freq: Every day | ORAL | Status: DC
  Administered 2023-05-27 – 2023-05-28 (×2): 5 mg via ORAL
  Filled 2023-05-26 (×3): qty 1

## 2023-05-26 MED ORDER — GABAPENTIN 100 MG PO CAPS
100.0000 mg | ORAL_CAPSULE | Freq: Once | ORAL | Status: AC
  Administered 2023-05-26: 100 mg via ORAL
  Filled 2023-05-26: qty 1

## 2023-05-26 MED ORDER — HEPARIN SODIUM (PORCINE) 1000 UNIT/ML IJ SOLN
INTRAMUSCULAR | Status: AC
  Administered 2023-05-26: 1000 [IU]
  Filled 2023-05-26: qty 4

## 2023-05-26 MED ORDER — HYDROMORPHONE HCL 2 MG PO TABS: 1.0000 mg | ORAL_TABLET | Freq: Once | ORAL | Status: DC

## 2023-05-26 MED ORDER — GLIPIZIDE ER 2.5 MG PO TB24
2.5000 mg | ORAL_TABLET | Freq: Once | ORAL | Status: AC
  Administered 2023-05-26: 2.5 mg via ORAL
  Filled 2023-05-26 (×2): qty 1

## 2023-05-26 MED ORDER — HYDROMORPHONE HCL 2 MG PO TABS
1.0000 mg | ORAL_TABLET | Freq: Once | ORAL | Status: AC
  Administered 2023-05-26: 1 mg via ORAL
  Filled 2023-05-26: qty 1

## 2023-05-26 MED ORDER — MAGNESIUM SULFATE 2 GM/50ML IV SOLN
2.0000 g | Freq: Once | INTRAVENOUS | Status: AC
  Administered 2023-05-26: 2 g via INTRAVENOUS
  Filled 2023-05-26: qty 50

## 2023-05-26 MED ORDER — METOLAZONE 5 MG PO TABS
5.0000 mg | ORAL_TABLET | Freq: Once | ORAL | Status: AC
  Administered 2023-05-26: 5 mg via ORAL
  Filled 2023-05-26: qty 1

## 2023-05-26 MED ORDER — HEPARIN SODIUM (PORCINE) 5000 UNIT/ML IJ SOLN
5000.0000 [IU] | Freq: Three times a day (TID) | INTRAMUSCULAR | Status: DC
  Administered 2023-05-26 – 2023-05-28 (×6): 5000 [IU] via SUBCUTANEOUS
  Filled 2023-05-26 (×7): qty 1

## 2023-05-26 MED ORDER — CHLORHEXIDINE GLUCONATE CLOTH 2 % EX PADS
6.0000 | MEDICATED_PAD | Freq: Every day | CUTANEOUS | Status: DC
  Administered 2023-05-27 – 2023-05-28 (×2): 6 via TOPICAL

## 2023-05-26 NOTE — Progress Notes (Signed)
Patient ID: [name], female   DOB: 01-19-1976, 47 y.o.   MRN: [id number] [facility] Progress Note   Assessment/ Plan:   1. Acute kidney Injury on chronic kidney disease stage IIIb: Secondary to acute exacerbation of diastolic heart failure in the setting of continued cocaine abuse.  She was started on HD on 6/20 after tunneled catheter placement with IR  - Heart failure team has added a limited-time trial of milrinone   - Transition to UF-only treatment today (got HD late overnight and was just brought down to the unit)  - UF-only treatment today.  Plan for HD again on 6/21 and 6/22 - monitor for renal recovery - Strict ins/outs  - note that advanced CHF team has also recommended consideration of palliative care consult and I agree.  She is a marginal candidate for long-term dialysis but doesn't have other options.    - transition to DM diet to control her diabetes - this is uncontrolled at present and already a challenge.  Nutrition consult re: renal diabetic diet.    2.  Metabolic alkalosis: Appears largely from CO2 retention with her habitus/OHS and exacerbated by diuretics.  Currently improving after earlier was refractory to acetazolamide administration.  3.  Acute exacerbation of diastolic heart failure: s/p right heart catheterization on 6/17 as above.  We have re-educated on the importance of cocaine cessation. - CHF team following - diuretics as above  4.  Hypokalemia: Secondary to diuresis.  Improved   5.  Hyperphosphatemia: Secondary to acute kidney injury, will continue to trend phosphorus levels. Now on HD.  Renal diabetic diet when taking PO  6.  Insulin-dependent diabetes mellitus: Significant hyperglycemia noted during current hospitalization; management per primary service.  7. HTN - I discontinued amlodipine to avoid hypotension  8. CKD stage 3b - note that her baseline Cr appears to be 1.8 - 2.0  Disposition: would continue inpatient monitoring.  She  has started HD   Subjective:    She had 1.5 liters UOP over 6/19 as well as one unmeasured urine void.  She had HD on 6/19 just after midnight with 0.5 kg UF.  She had a tunneled dialysis catheter placed with IR on 6/19.  She has been on milrinone.  She has been on 4 liters.  She asks for a nutrition consult.  States can't find anything to eat on new diet and now sugar is high.  Feels bad.    Seen and examined on dialysis.  Because she was brought down later we transitioned her to UF only treatment today.  Blood pressure 137/62 and HR 114.  Tolerating goal.  Her milrinone was running and I paused this - to be resumed when she gets back to the floor.  Spoke with HD RN and floor nurse.   Review of systems:     Denies chest pain  She has shortness of breath with exertion; she felt like she couldn't breathe earlier. she is on supplemental oxygen at home, too. UOP improved     Objective:   BP 134/71 (BP Location: Right Wrist)   Pulse (!) 115   Temp 99.1 F (37.3 C) (Oral)   Resp (!) 87   Ht 5\' 2"  (1.575 m)   Wt (!) 158.7 kg   SpO2 99%   BMI 63.99 kg/m   Intake/Output Summary (Last 24 hours) at 05/26/2023 1515 Last data filed at 05/26/2023 1217 Gross per 24 hour  Intake 865.75 ml  Output 1200 ml  Net -334.25 ml  Weight change:   Physical Exam:    General adult female in bed in no acute distress, morbidly obese body habitus HEENT normocephalic atraumatic extraocular movements intact sclera anicteric Neck supple trachea midline Lungs reduced breath sounds on auscultation; no wheezing; on 4 liters supplemental oxygen; unlabored at rest Heart S1S2 no rub Abdomen soft nontender obese body habitus with pannus Extremities right foot with transmetatarsal amputation; left BKA; 2+ edema lower extremities Psych normal mood and affect Neuro - alert and oriented; conversant and follows commands Access RIJ tunn catheter    Imaging: IR Fluoro Guide CV Line Right  Result Date:  05/26/2023 INDICATION: Acute on chronic kidney disease. EXAM: FLUOROSCOPIC AND ULTRASOUND GUIDED PLACEMENT OF A TUNNELED DIALYSIS CATHETER Physician: Rachelle Hora. Lowella Dandy, MD MEDICATIONS: Ancef 3 g; The antibiotic was administered within an appropriate time interval prior to skin puncture. ANESTHESIA/SEDATION: Moderate (conscious) sedation was employed during this procedure. A total of Versed 2mg  and fentanyl 100 mcg was administered intravenously at the order of the provider performing the procedure. Total intra-service moderate sedation time: 30 minutes. Patient's level of consciousness and vital signs were monitored continuously by radiology nurse throughout the procedure under the supervision of the provider performing the procedure. FLUOROSCOPY TIME:  Radiation Exposure Index (as provided by the fluoroscopic device): 28 mGy Kerma COMPLICATIONS: None immediate. PROCEDURE: The procedure was explained to the patient. The risks and benefits of the procedure were discussed and the patient's questions were addressed. Informed consent was obtained from the patient. The patient was placed supine on the interventional table. Ultrasound confirmed a patent right internal jugular vein. Ultrasound image obtained for documentation. The right neck and chest was prepped and draped in a sterile fashion. Maximal barrier sterile technique was utilized including caps, mask, sterile gowns, sterile gloves, sterile drape, hand hygiene and skin antiseptic. The right neck was anesthetized with 1% lidocaine. A small incision was made with #11 blade scalpel. A 21 gauge needle directed into the right internal jugular vein with ultrasound guidance. A micropuncture dilator set was placed. A 23 cm tip to cuff Palindrome catheter was selected. The skin below the right clavicle was anesthetized and a small incision was made with an #11 blade scalpel. A subcutaneous tunnel was formed to the vein dermatotomy site. The catheter was brought through the  tunnel. The vein dermatotomy site was dilated to accommodate a peel-away sheath. The catheter was placed through the peel-away sheath and directed into the central venous structures. The tip of the catheter was placed at superior cavoatrial junction with fluoroscopy. Fluoroscopic images were obtained for documentation. Both lumens were found to aspirate and flush well. The proper amount of heparin was flushed in both lumens. The vein dermatotomy site was closed using a single layer of absorbable suture and Dermabond. Gel-Foam was placed in subcutaneous tract. The catheter was secured to the skin using Prolene suture. IMPRESSION: Successful placement of a right jugular tunneled dialysis catheter using ultrasound and fluoroscopic guidance. Electronically Signed   By: Richarda Overlie M.D.   On: 05/26/2023 08:38   IR US Guide Vasc Access Right  Result Date: 05/26/2023 INDICATION: Acute on chronic kidney disease. EXAM: FLUOROSCOPIC AND ULTRASOUND GUIDED PLACEMENT OF A TUNNELED DIALYSIS CATHETER Physician: Rachelle Hora. Lowella Dandy, MD MEDICATIONS: Ancef 3 g; The antibiotic was administered within an appropriate time interval prior to skin puncture. ANESTHESIA/SEDATION: Moderate (conscious) sedation was employed during this procedure. A total of Versed 2mg  and fentanyl 100 mcg was administered intravenously at the order of the provider performing the procedure.  Total intra-service moderate sedation time: 30 minutes. Patient's level of consciousness and vital signs were monitored continuously by radiology nurse throughout the procedure under the supervision of the provider performing the procedure. FLUOROSCOPY TIME:  Radiation Exposure Index (as provided by the fluoroscopic device): 28 mGy Kerma COMPLICATIONS: None immediate. PROCEDURE: The procedure was explained to the patient. The risks and benefits of the procedure were discussed and the patient's questions were addressed. Informed consent was obtained from the patient. The patient  was placed supine on the interventional table. Ultrasound confirmed a patent right internal jugular vein. Ultrasound image obtained for documentation. The right neck and chest was prepped and draped in a sterile fashion. Maximal barrier sterile technique was utilized including caps, mask, sterile gowns, sterile gloves, sterile drape, hand hygiene and skin antiseptic. The right neck was anesthetized with 1% lidocaine. A small incision was made with #11 blade scalpel. A 21 gauge needle directed into the right internal jugular vein with ultrasound guidance. A micropuncture dilator set was placed. A 23 cm tip to cuff Palindrome catheter was selected. The skin below the right clavicle was anesthetized and a small incision was made with an #11 blade scalpel. A subcutaneous tunnel was formed to the vein dermatotomy site. The catheter was brought through the tunnel. The vein dermatotomy site was dilated to accommodate a peel-away sheath. The catheter was placed through the peel-away sheath and directed into the central venous structures. The tip of the catheter was placed at superior cavoatrial junction with fluoroscopy. Fluoroscopic images were obtained for documentation. Both lumens were found to aspirate and flush well. The proper amount of heparin was flushed in both lumens. The vein dermatotomy site was closed using a single layer of absorbable suture and Dermabond. Gel-Foam was placed in subcutaneous tract. The catheter was secured to the skin using Prolene suture. IMPRESSION: Successful placement of a right jugular tunneled dialysis catheter using ultrasound and fluoroscopic guidance. Electronically Signed   By: Richarda Overlie M.D.   On: 05/26/2023 08:38    Labs: BMET Recent Labs  Lab 05/21/23 0143 05/22/23 0227 05/23/23 0226 05/23/23 0941 05/24/23 0221 05/25/23 0632 05/26/23 0030 05/26/23 0758  NA 134* 131* 129* 133*  132* 131* 130* 132* 131*  K 3.4* 3.4* 3.8 4.1  4.1 4.2 4.2 4.2 4.4  CL 85* 88* 89*   --  91* 91* 91* 92*  CO2 30 27 25   --  25 22 24 25   GLUCOSE 319* 337* 329*  --  371* 366* 294* 366*  BUN 129* 124* 129*  --  132* 134* 142* 97*  CREATININE 2.02* 1.89* 2.06*  --  2.21* 2.15* 2.43* 1.92*  CALCIUM 9.7 9.1 8.9  --  9.1 9.3 9.0 8.7*  PHOS 5.1* 4.5 4.8*  --  5.5* 5.8* 6.3* 4.6   CBC Recent Labs  Lab 05/22/23 0227 05/23/23 0226 05/23/23 0941 05/24/23 0221 05/26/23 0030  WBC 11.6* 12.1*  --  12.1* 11.4*  NEUTROABS  --  7.1  --  6.4 6.7  HGB 10.8* 10.6* 11.9*  11.9* 11.0* 9.5*  HCT 32.7* 32.3* 35.0*  35.0* 33.4* 29.3*  MCV 91.3 90.0  --  89.5 92.7  PLT 296 294  --  285 271    Medications:     aspirin EC  81 mg Oral Daily   atorvastatin  80 mg Oral Daily   brinzolamide  1 drop Both Eyes TID   And   brimonidine  1 drop Both Eyes TID   Chlorhexidine Gluconate Cloth  6 each Topical Q0600   Chlorhexidine Gluconate Cloth  6 each Topical Q0600   furosemide  80 mg Intravenous Q8H   gabapentin  300 mg Oral QHS   [START ON 05/27/2023] glipiZIDE  5 mg Oral Q breakfast   heparin injection (subcutaneous)  5,000 Units Subcutaneous Q8H   insulin aspart  0-20 Units Subcutaneous TID WC   insulin aspart  0-5 Units Subcutaneous QHS   insulin aspart  45 Units Subcutaneous TID AC   insulin glargine-yfgn  80 Units Subcutaneous BID   latanoprost  1 drop Both Eyes Daily   lidocaine  1 patch Transdermal Q24H   mometasone-formoterol  2 puff Inhalation BID   pantoprazole  40 mg Oral Daily   prednisoLONE acetate  1 drop Both Eyes QID   prochlorperazine  10 mg Oral Once   rOPINIRole  1 mg Oral QHS   sevelamer carbonate  800 mg Oral TID WC   sodium chloride flush  3 mL Intravenous Q12H   Imaging:  Heart cath on 6/17 with mildly reduced cardiac output / cardiac index and per cardiology her hemodynamics are consistent with RV failure likely from chronic OHS/OSA.    Estanislado Emms, MD 05/26/2023, 3:50 PM

## 2023-05-26 NOTE — Progress Notes (Signed)
   05/26/23 0230  Vitals  Temp 97.9 F (36.6 C)  Temp Source Oral  BP 138/80  MAP (mmHg) 95  BP Location Right Arm  BP Method Automatic  Patient Position (if appropriate) Lying  Pulse Rate 100  Pulse Rate Source Monitor  Resp 10  Oxygen Therapy  SpO2 100 %  O2 Device CPAP  During Treatment Monitoring  Intra-Hemodialysis Comments Tx completed  Hemodialysis Catheter Right Subclavian Double lumen Permanent (Tunneled)  Placement Date/Time: 05/25/23 1648   Serial / Lot #: [id number]  Expiration Date: 02/03/28  Time Out: Correct patient;Correct site;Correct procedure  Maximum sterile barrier precautions: Hand hygiene;Cap;Mask;Sterile gown;Sterile gloves;Large sterile s...  Site Condition No complications  Blue Lumen Status Saline locked  Red Lumen Status Saline locked  Catheter fill volume (Arterial) 2 cc  Catheter fill volume (Venous) 2  Dressing Type Transparent  Dressing Status Clean, Dry, Intact  Drainage Description None  Post treatment catheter status Capped and Clamped   Report to RN [employer].

## 2023-05-26 NOTE — Progress Notes (Signed)
Daily Progress Note Intern Pager: [phone]  Patient name: [name] Medical record number: [id number] Date of birth: 1976/04/25 Age: 47 y.o. Gender: female  Primary Care Provider: Latrelle Dodrill, MD Consultants: Nephrology, Heart Failure Code Status: Full Code  Pt Overview and Major Events to Date:  6/9 - admitted   Assessment and Plan: [name] is a 47 y.o. female admitted for ACS work-up in the setting of recent RHC, heart failure, significant kidney disease, diabetes, and OSA. Now ongoing evaluation of need for dialysis and continuing medication optimization of cardiac and renal medications.   Hospital Problem List      Hospital     * (Principal) Acute on chronic diastolic heart failure ([name])     Last echo 3/24 with EF 55-60%. RHC demonstrating continued RV failure.  -Heart Failure team and nephrology consulted, appreciate recs -Milrinone -diuresis per nephrology/cardiology -Home metalazone -Lasix 80mg  IV TID daily -Mag >2, K>4 -Strict I/os -Daily weights        Type 2 diabetes mellitus with hyperglycemia ([name])     Poorly controlled DM with most recent A1c of 13.5 a month ago. Home  meds include Tresiba 70 units twice daily and NovoLog 45u 3 times daily.  Glucoses continue to be elevated. -Semglee 80u BID -Novolog continue to 45u TID WC -Glipizide increase to 5mg  daily -at bedtime sliding scale coverage -Resistant SSI with CBGs before every meal and nightly        RESOLVED: Chest pain     Patient has chronic history of chest pain likely multifactorial due to  cocaine use/MSK; however, is very high risk for ACS.  -Appreciate HF team recs, diuresis as noted -cardiac monitoring -ASA, statin        Azotemia     Labs improved with Initial HD session last night. Cr 2.43>1.92, BUN  142>97. -nephrology consulted, appreciate recs -Renvala -monitor renal function -CRRT vs HD pending Nephro recs        RVF (right ventricular failure) ([name])     FEN/GI: Renal diet w/1.5L fluid restriction PPx: Lovenox Dispo:Pending clinical improvement  Subjective:  NAEO. Got dialysis last night. Upset about renal diet and would like something for pain.   Objective: Temp:  [97.7 F (36.5 C)-98.4 F (36.9 C)] 97.7 F (36.5 C) (06/20 0421) Pulse Rate:  [74-114] 108 (06/20 0421) Resp:  [10-20] 20 (06/20 0819) BP: (92-165)/(51-98) 124/60 (06/20 0819) SpO2:  [90 %-100 %] 99 % (06/20 0819) Physical Exam: General: NAD, sitting comfortably at edge of hospital bed Cardiovascular: RRR, distant heart sounds, trace peripheral edema Respiratory: normal WOB on RA, CTAB, no wheezes, ronchi or rales Abdomen: soft, NTTP, no rebound or guarding Extremities: Moving all 4 extremities equally   Laboratory: Most recent CBC Lab Results  Component Value Date   WBC 11.4 (H) 05/26/2023   HGB 9.5 (L) 05/26/2023   HCT 29.3 (L) 05/26/2023   MCV 92.7 05/26/2023   PLT 271 05/26/2023   Most recent BMP    Latest Ref Rng & Units 05/26/2023    7:58 AM  BMP  Glucose 70 - 99 mg/dL 644   BUN 6 - 20 mg/dL 97   Creatinine 0.34 - 1.00 mg/dL 7.42   Sodium 595 - 638 mmol/L 131   Potassium 3.5 - 5.1 mmol/L 4.4   Chloride 98 - 111 mmol/L 92   CO2 22 - 32 mmol/L 25   Calcium 8.9 - 10.3 mg/dL 8.7     Mag 1.9 Phos 6.3  Imaging/Diagnostic Tests:  No new imaging.  Celine Mans, MD 05/26/2023, 9:33 AM  PGY-1, [facility] FPTS Intern pager: [phone], text pages welcome Secure chat group [facility] Teaching Service

## 2023-05-26 NOTE — Progress Notes (Addendum)
Advanced Heart Failure Rounding Note  PCP-Cardiologist: Tonny Bollman, MD   Subjective:    [facility] cath placed yesterday.  Had iHD early this am, 500 cc volume removed. Has voided once since HD, unmeasured amount recorded but RN reports about 300 cc.   Continues on milrinone 0.25 + IV lasix 80 mg q 8 hrs.  iHD planned for today.   Requesting large quantities of fluid.  Short of breath this am. Notes neuropathic pain in her legs.   Objective:   Weight Range: (!) 158.7 kg Body mass index is 63.99 kg/m.   Vital Signs:   Temp:  [97.7 F (36.5 C)-98.4 F (36.9 C)] 97.7 F (36.5 C) (06/20 0421) Pulse Rate:  [74-114] 108 (06/20 0421) Resp:  [10-20] 20 (06/20 0819) BP: (92-165)/(51-98) 124/60 (06/20 0819) SpO2:  [90 %-100 %] 99 % (06/20 0819) Last BM Date : 05/24/23  Weight change: Filed Weights   05/23/23 0533 05/24/23 0410 05/25/23 0037  Weight: (!) 161.6 kg (!) 158.1 kg (!) 158.7 kg    Intake/Output:   Intake/Output Summary (Last 24 hours) at 05/26/2023 1102 Last data filed at 05/26/2023 0824 Gross per 24 hour  Intake 865.75 ml  Output 1400 ml  Net -534.25 ml      Physical Exam    General:  Super morbidly obese female, chronically ill appearing. HEENT: normal Neck: Thick neck Cor: PMI nondisplaced. Heart sounds distant. Regular rate & rhythm. No rubs, gallops or murmurs. Lungs: diminished Abdomen: obese, soft, nontender, nondistended.  Extremities: no cyanosis, clubbing, rash, 1-2+ edema in thighs, L BKA, R TMA Neuro: alert & orientedx3, cranial nerves grossly intact. moves all 4 extremities w/o difficulty. Affect pleasant    Telemetry   Sinus Tach 110s   EKG    N/A   Labs    CBC Recent Labs    05/24/23 0221 05/26/23 0030  WBC 12.1* 11.4*  NEUTROABS 6.4 6.7  HGB 11.0* 9.5*  HCT 33.4* 29.3*  MCV 89.5 92.7  PLT 285 271   Basic Metabolic Panel Recent Labs    16/10/96 0030 05/26/23 0758  NA 132* 131*  K 4.2 4.4  CL 91* 92*  CO2  24 25  GLUCOSE 294* 366*  BUN 142* 97*  CREATININE 2.43* 1.92*  CALCIUM 9.0 8.7*  MG 1.9 1.8  PHOS 6.3* 4.6   Liver Function Tests Recent Labs    05/24/23 0221 05/26/23 0030  AST 19  --   ALT 25  --   ALKPHOS 163*  --   BILITOT 0.5  --   PROT 7.9  --   ALBUMIN 2.9* 2.8*   No results for input(s): "LIPASE", "AMYLASE" in the last 72 hours. Cardiac Enzymes No results for input(s): "CKTOTAL", "CKMB", "CKMBINDEX", "TROPONINI" in the last 72 hours.  BNP: BNP (last 3 results) Recent Labs    04/12/23 1231 04/25/23 1856 05/14/23 2240  BNP 19.5 22.8 19.3    ProBNP (last 3 results) No results for input(s): "PROBNP" in the last 8760 hours.   D-Dimer No results for input(s): "DDIMER" in the last 72 hours. Hemoglobin A1C No results for input(s): "HGBA1C" in the last 72 hours. Fasting Lipid Panel No results for input(s): "CHOL", "HDL", "LDLCALC", "TRIG", "CHOLHDL", "LDLDIRECT" in the last 72 hours. Thyroid Function Tests No results for input(s): "TSH", "T4TOTAL", "T3FREE", "THYROIDAB" in the last 72 hours.  Invalid input(s): "FREET3"  Other results:   Imaging    IR Fluoro Guide CV Line Right  Result Date: 05/26/2023 INDICATION: Acute on  chronic kidney disease. EXAM: FLUOROSCOPIC AND ULTRASOUND GUIDED PLACEMENT OF A TUNNELED DIALYSIS CATHETER Physician: Rachelle Hora. Lowella Dandy, MD MEDICATIONS: Ancef 3 g; The antibiotic was administered within an appropriate time interval prior to skin puncture. ANESTHESIA/SEDATION: Moderate (conscious) sedation was employed during this procedure. A total of Versed 2mg  and fentanyl 100 mcg was administered intravenously at the order of the provider performing the procedure. Total intra-service moderate sedation time: 30 minutes. Patient's level of consciousness and vital signs were monitored continuously by radiology nurse throughout the procedure under the supervision of the provider performing the procedure. FLUOROSCOPY TIME:  Radiation Exposure Index  (as provided by the fluoroscopic device): 28 mGy Kerma COMPLICATIONS: None immediate. PROCEDURE: The procedure was explained to the patient. The risks and benefits of the procedure were discussed and the patient's questions were addressed. Informed consent was obtained from the patient. The patient was placed supine on the interventional table. Ultrasound confirmed a patent right internal jugular vein. Ultrasound image obtained for documentation. The right neck and chest was prepped and draped in a sterile fashion. Maximal barrier sterile technique was utilized including caps, mask, sterile gowns, sterile gloves, sterile drape, hand hygiene and skin antiseptic. The right neck was anesthetized with 1% lidocaine. A small incision was made with #11 blade scalpel. A 21 gauge needle directed into the right internal jugular vein with ultrasound guidance. A micropuncture dilator set was placed. A 23 cm tip to cuff Palindrome catheter was selected. The skin below the right clavicle was anesthetized and a small incision was made with an #11 blade scalpel. A subcutaneous tunnel was formed to the vein dermatotomy site. The catheter was brought through the tunnel. The vein dermatotomy site was dilated to accommodate a peel-away sheath. The catheter was placed through the peel-away sheath and directed into the central venous structures. The tip of the catheter was placed at superior cavoatrial junction with fluoroscopy. Fluoroscopic images were obtained for documentation. Both lumens were found to aspirate and flush well. The proper amount of heparin was flushed in both lumens. The vein dermatotomy site was closed using a single layer of absorbable suture and Dermabond. Gel-Foam was placed in subcutaneous tract. The catheter was secured to the skin using Prolene suture. IMPRESSION: Successful placement of a right jugular tunneled dialysis catheter using ultrasound and fluoroscopic guidance. Electronically Signed   By: Richarda Overlie  M.D.   On: 05/26/2023 08:38   IR US Guide Vasc Access Right  Result Date: 05/26/2023 INDICATION: Acute on chronic kidney disease. EXAM: FLUOROSCOPIC AND ULTRASOUND GUIDED PLACEMENT OF A TUNNELED DIALYSIS CATHETER Physician: Rachelle Hora. Lowella Dandy, MD MEDICATIONS: Ancef 3 g; The antibiotic was administered within an appropriate time interval prior to skin puncture. ANESTHESIA/SEDATION: Moderate (conscious) sedation was employed during this procedure. A total of Versed 2mg  and fentanyl 100 mcg was administered intravenously at the order of the provider performing the procedure. Total intra-service moderate sedation time: 30 minutes. Patient's level of consciousness and vital signs were monitored continuously by radiology nurse throughout the procedure under the supervision of the provider performing the procedure. FLUOROSCOPY TIME:  Radiation Exposure Index (as provided by the fluoroscopic device): 28 mGy Kerma COMPLICATIONS: None immediate. PROCEDURE: The procedure was explained to the patient. The risks and benefits of the procedure were discussed and the patient's questions were addressed. Informed consent was obtained from the patient. The patient was placed supine on the interventional table. Ultrasound confirmed a patent right internal jugular vein. Ultrasound image obtained for documentation. The right neck and chest was prepped  and draped in a sterile fashion. Maximal barrier sterile technique was utilized including caps, mask, sterile gowns, sterile gloves, sterile drape, hand hygiene and skin antiseptic. The right neck was anesthetized with 1% lidocaine. A small incision was made with #11 blade scalpel. A 21 gauge needle directed into the right internal jugular vein with ultrasound guidance. A micropuncture dilator set was placed. A 23 cm tip to cuff Palindrome catheter was selected. The skin below the right clavicle was anesthetized and a small incision was made with an #11 blade scalpel. A subcutaneous tunnel  was formed to the vein dermatotomy site. The catheter was brought through the tunnel. The vein dermatotomy site was dilated to accommodate a peel-away sheath. The catheter was placed through the peel-away sheath and directed into the central venous structures. The tip of the catheter was placed at superior cavoatrial junction with fluoroscopy. Fluoroscopic images were obtained for documentation. Both lumens were found to aspirate and flush well. The proper amount of heparin was flushed in both lumens. The vein dermatotomy site was closed using a single layer of absorbable suture and Dermabond. Gel-Foam was placed in subcutaneous tract. The catheter was secured to the skin using Prolene suture. IMPRESSION: Successful placement of a right jugular tunneled dialysis catheter using ultrasound and fluoroscopic guidance. Electronically Signed   By: Richarda Overlie M.D.   On: 05/26/2023 08:38     Medications:     Scheduled Medications:  aspirin EC  81 mg Oral Daily   atorvastatin  80 mg Oral Daily   brinzolamide  1 drop Both Eyes TID   And   brimonidine  1 drop Both Eyes TID   Chlorhexidine Gluconate Cloth  6 each Topical Q0600   Chlorhexidine Gluconate Cloth  6 each Topical Q0600   furosemide  80 mg Intravenous Q8H   gabapentin  100 mg Oral Once   gabapentin  300 mg Oral QHS   glipiZIDE  2.5 mg Oral Once   [START ON 05/27/2023] glipiZIDE  5 mg Oral Q breakfast   heparin injection (subcutaneous)  5,000 Units Subcutaneous Q8H   HYDROmorphone  1 mg Oral Once   insulin aspart  0-20 Units Subcutaneous TID WC   insulin aspart  0-5 Units Subcutaneous QHS   insulin aspart  45 Units Subcutaneous TID AC   insulin glargine-yfgn  80 Units Subcutaneous BID   latanoprost  1 drop Both Eyes Daily   lidocaine  1 patch Transdermal Q24H   mometasone-formoterol  2 puff Inhalation BID   pantoprazole  40 mg Oral Daily   prednisoLONE acetate  1 drop Both Eyes QID   prochlorperazine  10 mg Oral Once   rOPINIRole  1 mg  Oral QHS   sevelamer carbonate  800 mg Oral TID WC   sodium chloride flush  3 mL Intravenous Q12H    Infusions:  sodium chloride     milrinone 0.25 mcg/kg/min (05/26/23 0824)    PRN Medications: sodium chloride, acetaminophen, albuterol, diclofenac Sodium, ipratropium-albuterol, nitroGLYCERIN, ondansetron (ZOFRAN) IV, mouth rinse, polyethylene glycol, sodium chloride flush    Patient Profile   47 y.o. 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  Admitted w/ a/c RV failure, c/b progressive renal failure  Assessment/Plan   1. Acute on Chronic Diastolic Heart Failure w/ Prominent RV Dysfunction  - Echo 3/24 EF 55-60%, RV not well visualized - RHC this admit c/w RV failure w/ marginal cardiac output, likely from chronic OHS/OSA (PAPi 1, mRA 18, PA 53/34, PCWP 22, CI 2.2) - Poor response to IV L, 500 cc volume removed. Remains volume overloaded. Continues on lasix 80 mg BID. Give 5 mg metolazone. Planning for iHD again today. - Will continue milrinone for RV support. Hopefully offloading RV w/ HD can help w/ output.  - no digoxin w/ CKD  - not candidate for SGLTi w/ poorly controlled DM and body habitus/ risk for GU infections  - fluid restrict. Need strict Is/Os.   2. Pulmonary HTN - RHC per above, suspect 2/2 chronic OHS/OSA - continue supp O2 support, BiPAP at night + diuretics  3. A/c CKD IIIb - underlying diabetic nephropathy c/b cardiorenal syndrome  - THD cath placed - plan iHD today per nephrology   4. Polysubstance Abuse - UDS + for cocaine  - imperative to quit    5. IDA - likely anemia of chronic disease from CKD  -  Tsat 10%  - received 4 doses of IV Fe    6. Super Morbid Obesity  Body mass index is 63 kg/m. - needs wt loss    7. Poorly Controlled T2DM w/ Complications - w/ CKD, retinopathy, HF and b/l LE amputations  - Recent Hgb A1c 13 on Insulin  - management per Family Medicine - followed by outpatient endocrinology    8. OHS/OSA - supp daytime O2 + BiPAP at bedtime - followed by outpatient pulmonology   GOC: Attempting volume removal with milrinone and iHD. Unfortunately, she likely has a very poor long-term prognosis. Would consider Palliative Care consult for ongoing GOC discussions.  Length of Stay: 10  Antjuan Rothe, Dalbert Garnet, PA-C  05/26/2023, 11:02 AM  Advanced Heart Failure Team Pager [phone] (M-F; 7a - 5p)  Please contact [facility] for night-coverage after hours (5p -7a ) and weekends on [email or web address]

## 2023-05-26 NOTE — Progress Notes (Signed)
   05/26/23 2000  BiPAP/CPAP/SIPAP  BiPAP/CPAP/SIPAP Pt Type Adult  BiPAP/CPAP/SIPAP Resmed  Mask Type Full face mask  Mask Size Small  IPAP 14 cmH20  EPAP 6 cmH2O  Flow Rate 5 lpm

## 2023-05-26 NOTE — Progress Notes (Signed)
Received patient in bed to unit. yes Alert and oriented. x4 Informed consent signed and in chart. yes  TX duration:2.5hrs  Patient tolerated well. yes Transported back to the room yes Alert, without acute distress. yes Hand-off given to patient's nurse. J.Pooten RN  Access used: Rij permcath Access issues: none  Total UF removed: 500 Medication(s) given: tylenol 650mg  by mouth Post HD VS: 138/80 hr 100 t Post HD weight: 157.5kg   Greer Ee [facility]

## 2023-05-26 NOTE — Assessment & Plan Note (Signed)
Patient has chronic history of chest pain likely multifactorial due to cocaine use/MSK; however, is very high risk for ACS.  -Appreciate HF team recs, diuresis as noted -cardiac monitoring -ASA, statin

## 2023-05-26 NOTE — Assessment & Plan Note (Signed)
Poorly controlled DM with most recent A1c of 13.5 a month ago. Home meds include Tresiba 70 units twice daily and NovoLog 45u 3 times daily. Glucoses continue to be elevated. -Semglee 80u BID -Novolog continue to 45u TID WC -Glipizide increase to 5mg  daily -at bedtime sliding scale coverage -Resistant SSI with CBGs before every meal and nightly

## 2023-05-26 NOTE — Progress Notes (Signed)
ADDENDUM: PT in HD w/CPAP Setting remain the same as home settings 18/13 w/4L O2 bled in via home full face mask

## 2023-05-26 NOTE — Inpatient Diabetes Management (Signed)
Inpatient Diabetes Program Recommendations  AACE/ADA: New Consensus Statement on Inpatient Glycemic Control (2015)  Target Ranges:  Prepandial:   less than 140 mg/dL      Peak postprandial:   less than 180 mg/dL (1-2 hours)      Critically ill patients:  140 - 180 mg/dL   Lab Results  Component Value Date   GLUCAP 380 (H) 05/26/2023   HGBA1C 13.6 (H) 04/13/2023    Review of Glycemic Control  Latest Reference Range & Units 05/24/23 07:37 05/24/23 12:10 05/24/23 17:21 05/24/23 20:57 05/24/23 23:54 05/25/23 07:29 05/25/23 08:57  Glucose-Capillary 70 - 99 mg/dL 161 (H) 096 (H) 045 (H) 344 (H) 308 (H) 377 (H) 384 (H)   Diabetes history: DM 2 Outpatient Diabetes medications: Tresiba 70 units bid, Novolog 45 units tid Current orders for Inpatient glycemic control:  Semglee 80 units BID Novolog 0-20 units tid Novolog 45 units tid meal coverage Glipizide 5 mg Daily  A1c 13.6% on 5/8  Inpatient Diabetes Program Recommendations:    -  Increase Semglee to 90 units bid -  change Novolog 0-20 units Q4 hours   Thanks, Christena Deem RN, MSN, BC-ADM Inpatient Diabetes Coordinator Team Pager [phone] (8a-5p)

## 2023-05-26 NOTE — Assessment & Plan Note (Addendum)
Last echo 3/24 with EF 55-60%. RHC demonstrating continued RV failure.  -Heart Failure team and nephrology consulted, appreciate recs -Milrinone -diuresis per nephrology/cardiology -Home metalazone -Lasix 80mg  IV TID daily -Mag >2, K>4 -Strict I/os -Daily weights

## 2023-05-26 NOTE — Progress Notes (Signed)
   05/26/23 1825  Vitals  Temp 98.1 F (36.7 C)  Pulse Rate (!) 108  Resp 19  BP 135/79  SpO2 98 %  O2 Device Nasal Cannula  Weight (!) 164.1 kg  Type of Weight Post-Dialysis (Bed Scale Weight)  Oxygen Therapy  O2 Flow Rate (L/min) 4 L/min  Patient Activity (if Appropriate) In bed  Pulse Oximetry Type Continuous  Post Treatment  Dialyzer Clearance Clear  Duration of HD Treatment -hour(s) 2.5 hour(s)  Hemodialysis Intake (mL) 0 mL  Liters Processed 37.5  Fluid Removed (mL) 1200 mL  Tolerated HD Treatment Yes  Post-Hemodialysis Comments The last 35 mins pt. start to have abdomen cramping, RN turn UF off and massage abdomen, pt start to cramp in abdomen intermittent so UF remains off.   Received patient in bed to unit.  Alert and oriented.  Informed consent signed and in chart.   TX duration: 2.5 Hours  Patient tolerated well.  Transported back to the room  Alert, without acute distress.  Hand-off given to patient's nurse.   Access used: Yes Access issues: No  Total UF removed: 1200 Medication(s) given: See MAR Post HD VS: See Above Grid Post HD weight: 164.1 kg   [facility]

## 2023-05-26 NOTE — Progress Notes (Signed)
Physical Therapy Treatment Patient Details Name: [name] MRN: [id number] DOB: [birth year] Today's Date: 05/26/2023   History of Present Illness Pt is a 47 year old female admitted on 05/14/23 with chest pain after RHC on 6/6. ACS workup unremarkable. Pt with recent cocaine use. Pt began HD on 05/26/23.  PMH - HFpEF/RV failure, OSA, Super morbid obesity, uncontrolled DM, CKD IIIb, cocaine use, non-compliance and left leg BKA/right transmetatarsal.    PT Comments    Pt fatigued after HD last night. Eager to get up in chair. Did not ambulate due to fatigue.  Recommendations for follow up therapy are one component of a multi-disciplinary discharge planning process, led by the attending physician.  Recommendations may be updated based on patient status, additional functional criteria and insurance authorization.  Follow Up Recommendations       Assistance Recommended at Discharge Intermittent Supervision/Assistance  Patient can return home with the following A little help with bathing/dressing/bathroom;Assist for transportation   Equipment Recommendations  None recommended by PT    Recommendations for Other Services       Precautions / Restrictions Precautions Precautions: Fall;Other (comment) Precaution Comments: L BKA, R transmet, watch SpO2 Required Braces or Orthoses: Other Brace Other Brace: L prothesis Restrictions Weight Bearing Restrictions: No     Mobility  Bed Mobility               General bed mobility comments: Sitting EOB    Transfers Overall transfer level: Needs assistance Equipment used: Rolling walker (2 wheels) Transfers: Sit to/from Stand, Bed to chair/wheelchair/BSC Sit to Stand: Supervision Stand pivot transfers: Supervision         General transfer comment: supervision for safety. Pt pushes up on walker with both hands. This is how pt has performed in past    Ambulation/Gait               General Gait Details: Pt too  fatigued   Stairs             Wheelchair Mobility    Modified Rankin (Stroke Patients Only)       Balance Overall balance assessment: Needs assistance Sitting-balance support: Feet supported Sitting balance-Leahy Scale: Good     Standing balance support: During functional activity, Single extremity supported, Bilateral upper extremity supported Standing balance-Leahy Scale: Poor Standing balance comment: walker and supervision for static standing                            Cognition Arousal/Alertness: Awake/alert Behavior During Therapy: WFL for tasks assessed/performed Overall Cognitive Status: Within Functional Limits for tasks assessed                                          Exercises      General Comments General comments (skin integrity, edema, etc.): VSS on 4L      Pertinent Vitals/Pain Pain Assessment Pain Assessment: Faces Faces Pain Scale: Hurts a little bit Pain Location: rt neck from HD catheter placement Pain Descriptors / Indicators: Sore Pain Intervention(s): Limited activity within patient's tolerance    Home Living                          Prior Function            PT Goals (current goals can now be found in the  care plan section) Acute Rehab PT Goals Patient Stated Goal: to walk Progress towards PT goals: Progressing toward goals    Frequency    Min 1X/week      PT Plan Current plan remains appropriate    Co-evaluation              AM-PAC PT "6 Clicks" Mobility   Outcome Measure  Help needed turning from your back to your side while in a flat bed without using bedrails?: None Help needed moving from lying on your back to sitting on the side of a flat bed without using bedrails?: None Help needed moving to and from a bed to a chair (including a wheelchair)?: A Little Help needed standing up from a chair using your arms (e.g., wheelchair or bedside chair)?: A Little Help  needed to walk in hospital room?: A Little Help needed climbing 3-5 steps with a railing? : A Lot 6 Click Score: 19    End of Session Equipment Utilized During Treatment: Oxygen Activity Tolerance: Patient limited by fatigue Patient left: with call bell/phone within reach;in chair (sitting EOB)   PT Visit Diagnosis: Other abnormalities of gait and mobility (R26.89)     Time: 0981-1914 PT Time Calculation (min) (ACUTE ONLY): 25 min  Charges:  $Therapeutic Activity: 23-37 mins                     [facility] PT Acute Rehabilitation Services Office [phone]    Angelina Ok [facility] 05/26/2023, 10:45 AM

## 2023-05-26 NOTE — Assessment & Plan Note (Addendum)
Labs improved with Initial HD session last night. Cr 2.43>1.92, BUN 142>97. -nephrology consulted, appreciate recs -Renvala -monitor renal function -CRRT vs HD pending Nephro recs

## 2023-05-27 ENCOUNTER — Inpatient Hospital Stay (HOSPITAL_COMMUNITY)
Admission: RE | Admit: 2023-05-27 | Discharge: 2023-05-27 | Disposition: A | Discharge status: Home / Self Care | Payer: 59 | Source: Ambulatory Visit

## 2023-05-27 DIAGNOSIS — I5033 Acute on chronic diastolic (congestive) heart failure: Secondary | ICD-10-CM | POA: Diagnosis not present

## 2023-05-27 LAB — BASIC METABOLIC PANEL
Anion gap: 11 (ref 5–15)
BUN: 73 mg/dL — ABNORMAL HIGH (ref 6–20)
CO2: 25 mmol/L (ref 22–32)
Calcium: 9.2 mg/dL (ref 8.9–10.3)
Chloride: 95 mmol/L — ABNORMAL LOW (ref 98–111)
Creatinine, Ser: 2.04 mg/dL — ABNORMAL HIGH (ref 0.44–1.00)
GFR, Estimated: 30 mL/min — ABNORMAL LOW (ref >60)
Glucose, Bld: 301 mg/dL — ABNORMAL HIGH (ref 70–99)
Potassium: 3.9 mmol/L (ref 3.5–5.1)
Sodium: 131 mmol/L — ABNORMAL LOW (ref 135–145)

## 2023-05-27 LAB — CBC
HCT: 33 % — ABNORMAL LOW (ref 36.0–46.0)
Hemoglobin: 10.7 g/dL — ABNORMAL LOW (ref 12.0–15.0)
MCH: 29.4 pg (ref 26.0–34.0)
MCHC: 32.4 g/dL (ref 30.0–36.0)
MCV: 90.7 fL (ref 80.0–100.0)
Platelets: 279 10*3/uL (ref 150–400)
RBC: 3.64 MIL/uL — ABNORMAL LOW (ref 3.87–5.11)
RDW: 16.2 % — ABNORMAL HIGH (ref 11.5–15.5)
WBC: 10.4 10*3/uL (ref 4.0–10.5)
nRBC: 0 % (ref 0.0–0.2)

## 2023-05-27 LAB — GLUCOSE, CAPILLARY
Glucose-Capillary: 424 mg/dL — ABNORMAL HIGH (ref 70–99)
Glucose-Capillary: 250 mg/dL — ABNORMAL HIGH (ref 70–99)
Glucose-Capillary: 319 mg/dL — ABNORMAL HIGH (ref 70–99)
Glucose-Capillary: 255 mg/dL — ABNORMAL HIGH (ref 70–99)

## 2023-05-27 LAB — PHOSPHORUS: Phosphorus: 4.9 mg/dL — ABNORMAL HIGH (ref 2.5–4.6)

## 2023-05-27 LAB — MAGNESIUM: Magnesium: 2.2 mg/dL (ref 1.7–2.4)

## 2023-05-27 MED ORDER — FUROSEMIDE 10 MG/ML IJ SOLN: 80.0000 mg | Freq: Two times a day (BID) | INTRAMUSCULAR | Status: DC

## 2023-05-27 MED ORDER — HEPARIN SODIUM (PORCINE) 1000 UNIT/ML DIALYSIS: 1000.0000 [IU] | INTRAMUSCULAR | Status: DC | PRN

## 2023-05-27 MED ORDER — PENTAFLUOROPROP-TETRAFLUOROETH EX AERO: 1.0000 | INHALATION_SPRAY | CUTANEOUS | Status: DC | PRN

## 2023-05-27 MED ORDER — ANTICOAGULANT SODIUM CITRATE 4% (200MG/5ML) IV SOLN: 5.0000 mL | Status: DC | PRN

## 2023-05-27 MED ORDER — LIDOCAINE-PRILOCAINE 2.5-2.5 % EX CREA: 1.0000 | TOPICAL_CREAM | CUTANEOUS | Status: DC | PRN

## 2023-05-27 MED ORDER — INSULIN ASPART 100 UNIT/ML IJ SOLN
50.0000 [IU] | Freq: Three times a day (TID) | INTRAMUSCULAR | Status: DC
  Administered 2023-05-27 – 2023-05-28 (×5): 50 [IU] via SUBCUTANEOUS

## 2023-05-27 MED ORDER — INSULIN GLARGINE-YFGN 100 UNIT/ML ~~LOC~~ SOLN
90.0000 [IU] | Freq: Two times a day (BID) | SUBCUTANEOUS | Status: DC
  Administered 2023-05-27 – 2023-05-28 (×2): 90 [IU] via SUBCUTANEOUS
  Filled 2023-05-27 (×3): qty 0.9

## 2023-05-27 MED ORDER — HEPARIN SODIUM (PORCINE) 1000 UNIT/ML IJ SOLN
INTRAMUSCULAR | Status: AC
  Filled 2023-05-27: qty 4

## 2023-05-27 MED ORDER — ALTEPLASE 2 MG IJ SOLR: 2.0000 mg | Freq: Once | INTRAMUSCULAR | Status: DC | PRN

## 2023-05-27 MED ORDER — LIDOCAINE HCL (PF) 1 % IJ SOLN: 5.0000 mL | INTRAMUSCULAR | Status: DC | PRN

## 2023-05-27 NOTE — Progress Notes (Signed)
Daily Progress Note Intern Pager: [phone]  Patient name: [name] Medical record number: [id number] Date of birth: 09/01/1976 Age: 47 y.o. Gender: female  Primary Care Provider: Latrelle Dodrill, MD Consultants: Cardiology, Nephrology Code Status: Full Code  Pt Overview and Major Events to Date:  6/9 - admitted   Assessment and Plan: [name] is a 47 y.o. female admitted for ACS work-up in the setting of recent RHC, heart failure, significant kidney disease, diabetes, and OSA. Now ongoing evaluation of need for dialysis and continuing medication optimization of cardiac and renal medications.   Hospital Problem List      Hospital     * (Principal) Acute on chronic diastolic heart failure ([name])     Last echo 3/24 with EF 55-60%. RHC demonstrating continued RV failure.  Net negative with dialysis over past 24hrs -1.6L -Heart Failure team and nephrology consulted, appreciate recs -Milrinone - wean today -diuresis per nephrology/cardiology -Diuretics stopped in the setting of dialysis for volume control -Strict I/os -Daily weights        Type 2 diabetes mellitus with hyperglycemia ([name])     Poorly controlled DM with most recent A1c of 13.5 a month ago. Home  meds include Tresiba 70 units twice daily and NovoLog 45u 3 times daily.  Glucoses continue to be elevated. -Semglee to 90u BID -Novolog increase to 50u TID WC -Glipizide 5mg  daily -at bedtime sliding scale coverage -Resistant SSI with CBGs before every meal and nightly        RESOLVED: Chest pain     Patient has chronic history of chest pain likely multifactorial due to  cocaine use/MSK; however, is very high risk for ACS.  -Appreciate HF team recs, diuresis as noted -cardiac monitoring -ASA, statin        Acute on chronic kidney failure ([name])     Labs stable with HD. Received only ultrafiltration yesterday. -nephrology consulted, appreciate recs -Renvala -monitor renal function -HD plan for  today and tomorrow.        RVF (right ventricular failure) ([name])    FEN/GI: Carb modified PPx: Heparin subQ Dispo:Pending clinical improvement  Subjective:  NAEO. Seen at bedside with Dr. Gasper Lloyd. Discussed weaning milrione today. Patient understanding of dialysis today and tomorrow.  Objective: Temp:  [97.7 F (36.5 C)-99.1 F (37.3 C)] 97.7 F (36.5 C) (06/21 1126) Pulse Rate:  [105-115] 105 (06/21 1237) Resp:  [16-87] 18 (06/21 1237) BP: (106-171)/(57-82) 120/82 (06/21 1237) SpO2:  [96 %-100 %] 97 % (06/21 1237) Weight:  [160.2 kg-164.5 kg] 160.2 kg (06/21 1122) Physical Exam: General: NAD  Neuro: A&O Cardiovascular: RRR, no murmurs, 1+ pitting peripheral edema Respiratory: normal WOB on RA, CTAB, no wheezes, ronchi or rales Abdomen: soft, NTTP, no rebound or guarding Extremities: Moving all 4 extremities equally   Laboratory: Most recent CBC Lab Results  Component Value Date   WBC 10.4 05/27/2023   HGB 10.7 (L) 05/27/2023   HCT 33.0 (L) 05/27/2023   MCV 90.7 05/27/2023   PLT 279 05/27/2023   Most recent BMP    Latest Ref Rng & Units 05/27/2023    2:55 AM  BMP  Glucose 70 - 99 mg/dL 962   BUN 6 - 20 mg/dL 73   Creatinine 9.52 - 1.00 mg/dL 8.41   Sodium 324 - 401 mmol/L 131   Potassium 3.5 - 5.1 mmol/L 3.9   Chloride 98 - 111 mmol/L 95   CO2 22 - 32 mmol/L 25   Calcium 8.9 - 10.3  mg/dL 9.2     Imaging/Diagnostic Tests: No new imaging.  Celine Mans, MD 05/27/2023, 1:58 PM  PGY-1, [facility] Family Medicine FPTS Intern pager: [phone], text pages welcome Secure chat group [facility] Teaching Service

## 2023-05-27 NOTE — Progress Notes (Signed)
Contacted by nephrologist regarding pt's likely need for out-pt HD at d/c. Nephrologist felt it best to start referral on Monday. Will f/u with nephrologist on Monday and start referral at that time if still deemed necessary. Will assist as needed.   Olivia Canter Renal Navigator [phone]

## 2023-05-27 NOTE — Progress Notes (Signed)
Nutrition Brief Note  Consult received for renal/carb modified diet education. Pt off unit for HD. Would benefit from in-patient nutrition education. "Heart Healthy Consistent Carbohydrate Nutrition Therapy" and "Phosphorus Content of Foods" handout added to AVS added to AVS and will attempt to follow up with nutrition education prior to discharge as able.   Patient admitted with CP d/t acute on chronic kidney failure. PMH significant for CKD, cocaine use, uncontrolled DM, and OSA.   S/p [facility] placement and HD initiated on 6/20.  Wt Readings from Last 15 Encounters:  05/27/23 (!) 160.2 kg  05/12/23 (!) 150 kg  04/28/23 (!) 150 kg  04/21/23 (!) 156 kg  04/11/23 (!) 155.2 kg  04/07/23 (!) 157.6 kg  03/22/23 (!) 162.2 kg  02/26/23 (!) 159.8 kg  12/28/22 (!) 157.4 kg  11/30/22 (!) 157.7 kg  10/30/22 (!) 155.6 kg  09/30/22 (!) 155.6 kg  09/24/22 (!) 155.6 kg  09/20/22 (!) 148.8 kg  09/02/22 (!) 164.3 kg    Body mass index is 64.6 kg/m. Patient meets criteria for morbid obesity based on current BMI.   Current diet order is Carb Modified, patient is consuming approximately 10% of meals at this time. Labs and medications reviewed.   Please re-consult RD if additional nutrition related concerns arise in the meantime.   Drusilla Kanner, RDN, LDN Clinical Nutrition

## 2023-05-27 NOTE — Discharge Instructions (Signed)
Heart Healthy, Consistent Carbohydrate Nutrition Therapy   A heart-healthy and consistent carbohydrate diet is recommended to manage heart disease and diabetes. To follow a heart-healthy and consistent carbohydrate diet, Eat a balanced diet with whole grains, fruits and vegetables, and lean protein sources.  Choose heart-healthy unsaturated fats. Limit saturated fats, trans fats, and cholesterol intake. Eat more plant-based or vegetarian meals using beans and soy foods for protein.  Eat whole, unprocessed foods to limit the amount of sodium (salt) you eat.  Choose a consistent amount of carbohydrate at each meal and snack. Limit refined carbohydrates especially sugar, sweets and sugar-sweetened beverages.  If you drink alcohol, do so in moderation: one serving per day (women) and two servings per day (men). o One serving is equivalent to 12 ounces beer, 5 ounces wine, or 1.5 ounces distilled spirits  Tips Tips for Choosing Heart-Healthy Fats Choose lean protein and low-fat dairy foods to reduce saturated fat intake. Saturated fat is usually found in animal-based protein and is associated with certain health risks. Saturated fat is the biggest contributor to raise low-density lipoprotein (LDL) cholesterol levels. Research shows that limiting saturated fat lowers unhealthy cholesterol levels. Eat no more than 7% of your total calories each day from saturated fat. Ask your RDN to help you determine how much saturated fat is right for you. There are many foods that do not contain large amounts of saturated fats. Swapping these foods to replace foods high in saturated fats will help you limit the saturated fat you eat and improve your cholesterol levels. You can also try eating more plant-based or vegetarian meals. Instead of. Try:  Whole milk, cheese, yogurt, and ice cream 1% or skim milk, low-fat cheese, non-fat yogurt, and low-fat ice cream  Fatty, marbled beef and pork Lean beef, pork, or venison   Poultry with skin Poultry without skin  Butter, stick margarine Reduced-fat, whipped, or liquid spreads  Coconut oil, palm oil Liquid vegetable oils: corn, canola, olive, soybean and safflower oils   Avoid foods that contain trans fats. Trans fats increase levels of LDL-cholesterol. Hydrogenated fat in processed foods is the main source of trans fats in foods.  Trans fats can be found in stick margarine, shortening, processed sweets, baked goods, some fried foods, and packaged foods made with hydrogenated oils. Avoid foods with "partially hydrogenated oil" on the ingredient list such as: cookies, pastries, baked goods, biscuits, crackers, microwave popcorn, and frozen dinners. Choose foods with heart healthy fats. Polyunsaturated and monounsaturated fat are unsaturated fats that may help lower your blood cholesterol level when used in place of saturated fat in your diet. Ask your RDN about taking a dietary supplement with plant sterols and stanols to help lower your cholesterol level. Research shows that substituting saturated fats with unsaturated fats is beneficial to cholesterol levels. Try these easy swaps: Instead of. Try:  Butter, stick margarine, or solid shortening Reduced-fat, whipped, or liquid spreads  Beef, pork, or poultry with skin Fish and seafood  Chips, crackers, snack foods Raw or unsalted nuts and seeds or nut butters Hummus with vegetables Avocado on toast  Coconut oil, palm oil Liquid vegetable oils: corn, canola, olive, soybean and safflower oils  Limit the amount of cholesterol you eat to less than 200 milligrams per day. Cholesterol is a substance carried through the bloodstream via lipoproteins, which are known as "transporters" of fat. Some body functions need cholesterol to work properly, but too much cholesterol in the bloodstream can damage arteries and build up blood vessel linings (  which can lead to heart attack and stroke). You should eat less than 200 milligrams  cholesterol per day. People respond differently to eating cholesterol. There is no test available right now that can figure out which people will respond more to dietary cholesterol and which will respond less. For individuals with high intake of dietary cholesterol, different types of increase (none, small, moderate, large) in LDL-cholesterol levels are all possible.  Food sources of cholesterol include egg yolks and organ meats such as liver, gizzards. Limit egg yolks to two to four per week and avoid organ meats like liver and gizzards to control cholesterol intake. Tips for Choosing Heart-Healthy Carbohydrates Consume a consistent amount of carbohydrate It is important to eat foods with carbohydrates in moderation because they impact your blood glucose level. Carbohydrates can be found in many foods such as: Grains (breads, crackers, rice, pasta, and cereals)  Starchy Vegetables (potatoes, corn, and peas)  Beans and legumes  Milk, soy milk, and yogurt  Fruit and fruit juice  Sweets (cakes, cookies, ice cream, jam and jelly) Your RDN will help you set a goal for how many carbohydrate servings to eat at your meals and snacks. For many adults, eating 3 to 5 servings of carbohydrate foods at each meal and 1 or 2 carbohydrate servings for each snack works well.  Check your blood glucose level regularly. It can tell you if you need to adjust when you eat carbohydrates. Choose foods rich in viscous (soluble) fiber Viscous, or soluble, is found in the walls of plant cells. Viscous fiber is found only in plant-based foods. Eating foods with fiber helps to lower your unhealthy cholesterol and keep your blood glucose in range  Rich sources of viscous fiber include vegetables (asparagus, Brussels sprouts, sweet potatoes, turnips) fruit (apricots, mangoes, oranges), legumes, and whole grains (barley, oats, and oat bran).  As you increase your fiber intake gradually, also increase the amount of water you  drink. This will help prevent constipation.  If you have difficulty achieving this goal, ask your RDN about fiber laxatives. Choose fiber supplements made with viscous fibers such as psyllium seed husks or methylcellulose to help lower unhealthy cholesterol.  Limit refined carbohydrates  There are three types of carbohydrates: starches, sugar, and fiber. Some carbohydrates occur naturally in food, like the starches in rice or corn or the sugars in fruits and milk. Refined carbohydrates--foods with high amounts of simple sugars--can raise triglyceride levels. High triglyceride levels are associated with coronary heart disease. Some examples of refined carbohydrate foods are table sugar, sweets, and beverages sweetened with added sugar. Tips for Reducing Sodium (Salt) Although sodium is important for your body to function, too much sodium can be harmful for people with high blood pressure. As sodium and fluid buildup in your tissues and bloodstream, your blood pressure increases. High blood pressure may cause damage to other organs and increase your risk for a stroke. Even if you take a pill for blood pressure or a water pill (diuretic) to remove fluid, it is still important to have less salt in your diet. Ask your doctor and RDN what amount of sodium is right for you. Avoid processed foods. Eat more fresh foods.  Fresh fruits and vegetables are naturally low in sodium, as well as frozen vegetables and fruits that have no added juices or sauces.  Fresh meats are lower in sodium than processed meats, such as bacon, sausage, and hotdogs. Read the nutrition label or ask your butcher to help you find a   fresh meat that is low in sodium. Eat less salt--at the table and when cooking.  A single teaspoon of table salt has 2,300 mg of sodium.  Leave the salt out of recipes for pasta, casseroles, and soups.  Ask your RDN how to cook your favorite recipes without sodium Be a smart shopper.  Look for food packages  that say "salt-free" or "sodium-free." These items contain less than 5 milligrams of sodium per serving.  "Very low-sodium" products contain less than 35 milligrams of sodium per serving.  "Low-sodium" products contain less than 140 milligrams of sodium per serving.  Beware for "Unsalted" or "No Added Salt" products. These items may still be high in sodium. Check the nutrition label. Add flavors to your food without adding sodium.  Try lemon juice, lime juice, fruit juice or vinegar.  Dry or fresh herbs add flavor. Try basil, bay leaf, dill, rosemary, parsley, sage, dry mustard, nutmeg, thyme, and paprika.  Pepper, red pepper flakes, and cayenne pepper can add spice t your meals without adding sodium. Hot sauce contains sodium, but if you use just a drop or two, it will not add up to much.  Buy a sodium-free seasoning blend or make your own at home. Additional Lifestyle Tips Achieve and maintain a healthy weight. Talk with your RDN or your doctor about what is a healthy weight for you. Set goals to reach and maintain that weight.  To lose weight, reduce your calorie intake along with increasing your physical activity. A weight loss of 10 to 15 pounds could reduce LDL-cholesterol by 5 milligrams per deciliter. Participate in physical activity. Talk with your health care team to find out what types of physical activity are best for you. Set a plan to get about 30 minutes of exercise on most days.  Foods Recommended Food Group Foods Recommended  Grains Whole grain breads and cereals, including whole wheat, barley, rye, buckwheat, corn, teff, quinoa, millet, amaranth, brown or wild rice, sorghum, and oats Pasta, especially whole wheat or other whole grain types  Brown rice, quinoa or wild rice Whole grain crackers, bread, rolls, pitas Home-made bread with reduced-sodium baking soda  Protein Foods Lean cuts of beef and pork (loin, leg, round, extra lean hamburger)  Skinless poultry Fish Venison  and other wild game Dried beans and peas Nuts and nut butters Meat alternatives made with soy or textured vegetable protein  Egg whites or egg substitute Cold cuts made with lean meat or soy protein  Dairy Nonfat (skim), low-fat, or 1%-fat milk  Nonfat or low-fat yogurt or cottage cheese Fat-free and low-fat cheese  Vegetables Fresh, frozen, or canned vegetables without added fat or salt   Fruits Fresh, frozen, canned, or dried fruit   Oils Unsaturated oils (corn, olive, peanut, soy, sunflower, canola)  Soft or liquid margarines and vegetable oil spreads  Salad dressings Seeds and nuts  Avocado   Foods Not Recommended Food Group Foods Not Recommended  Grains Breads or crackers topped with salt Cereals (hot or cold) with more than 300 mg sodium per serving Biscuits, cornbread, and other "quick" breads prepared with baking soda Bread crumbs or stuffing mix from a store High-fat bakery products, such as doughnuts, biscuits, croissants, danish pastries, pies, cookies Instant cooking foods to which you add hot water and stir--potatoes, noodles, rice, etc. Packaged starchy foods--seasoned noodle or rice dishes, stuffing mix, macaroni and cheese dinner Snacks made with partially hydrogenated oils, including chips, cheese puffs, snack mixes, regular crackers, butter-flavored popcorn  Protein Foods   Higher-fat cuts of meats (ribs, t-bone steak, regular hamburger) Bacon, sausage, or hot dogs Cold cuts, such as salami or bologna, deli meats, cured meats, corned beef Organ meats (liver, brains, gizzards, sweetbreads) Poultry with skin Fried or smoked meat, poultry, and fish Whole eggs and egg yolks (more than 2-4 per week) Salted legumes, nuts, seeds, or nut/seed butters Meat alternatives with high levels of sodium (>300 mg per serving) or saturated fat (>5 g per serving)  Dairy Whole milk,?2% fat milk, buttermilk Whole milk yogurt or ice cream Cream Half-&-half Cream cheese Sour  cream Cheese  Vegetables Canned or frozen vegetables with salt, fresh vegetables prepared with salt, butter, cheese, or cream sauce Fried vegetables Pickled vegetables such as olives, pickles, or sauerkraut  Fruits Fried fruits Fruits served with butter or cream  Oils Butter, stick margarine, shortening Partially hydrogenated oils or trans fats Tropical oils (coconut, palm, palm kernel oils)  Other Candy, sugar sweetened soft drinks and desserts Salt, sea salt, garlic salt, and seasoning mixes containing salt Bouillon cubes Ketchup, barbecue sauce, Worcestershire sauce, soy sauce, teriyaki sauce Miso Salsa Pickles, olives, relish   Heart Healthy Consistent Carbohydrate Vegetarian (Lacto-Ovo) Sample 1-Day Menu  Breakfast 1 cup oatmeal, cooked (2 carbohydrate servings)   cup blueberries (1 carbohydrate serving)  11 almonds, without salt  1 cup 1% milk (1 carbohydrate serving)  1 cup coffee  Morning Snack 1 cup fat-free plain yogurt (1 carbohydrate serving)  Lunch 1 whole wheat bun (1 carbohydrate servings)  1 black bean burger (1 carbohydrate servings)  1 slice cheddar cheese, low sodium  2 slices tomatoes  2 leaves lettuce  1 teaspoon mustard  1 small pear (1 carbohydrate servings)  1 cup green tea, unsweetened  Afternoon Snack 1/3 cup trail mix with nuts, seeds, and raisins, without salt (1 carbohydrate servinga)  Evening Meal  cup meatless chicken  2/3 cup brown rice, cooked (2 carbohydrate servings)  1 cup broccoli, cooked (2/3 carbohydrate serving)   cup carrots, cooked (1/3 carbohydrate serving)  2 teaspoons olive oil  1 teaspoon balsamic vinegar  1 whole wheat dinner roll (1 carbohydrate serving)  1 teaspoon margarine, soft, tub  1 cup 1% milk (1 carbohydrate serving)  Evening Snack 1 extra small banana (1 carbohydrate serving)  1 tablespoon peanut butter   Heart Healthy Consistent Carbohydrate Vegan Sample 1-Day Menu  Breakfast 1 cup oatmeal, cooked (2  carbohydrate servings)   cup blueberries (1 carbohydrate serving)  11 almonds, without salt  1 cup soymilk fortified with calcium, vitamin B12, and vitamin D  1 cup coffee  Morning Snack 6 ounces soy yogurt (1 carbohydrate servings)  Lunch 1 whole wheat bun(1 carbohydrate servings)  1 black bean burger (1 carbohydrate serving)  2 slices tomatoes  2 leaves lettuce  1 teaspoon mustard  1 small pear (1 carbohydrate servings)  1 cup green tea, unsweetened  Afternoon Snack 1/3 cup trail mix with nuts, seeds, and raisins, without salt (1 carbohydrate servings)  Evening Meal  cup meatless chicken  2/3 cup brown rice, cooked (2 carbohydrate servings)  1 cup broccoli, cooked (2/3 carbohydrate serving)   cup carrots, cooked (1/3 carbohydrate serving)  2 teaspoons olive oil  1 teaspoon balsamic vinegar  1 whole wheat dinner roll (1 carbohydrate serving)  1 teaspoon margarine, soft, tub  1 cup soymilk fortified with calcium, vitamin B12, and vitamin D  Evening Snack 1 extra small banana (1 carbohydrate serving)  1 tablespoon peanut butter    Heart Healthy Consistent Carbohydrate Sample  1-Day Menu  Breakfast 1 cup cooked oatmeal (2 carbohydrate servings)  3/4 cup blueberries (1 carbohydrate serving)  1 ounce almonds  1 cup skim milk (1 carbohydrate serving)  1 cup coffee  Morning Snack 1 cup sugar-free nonfat yogurt (1 carbohydrate serving)  Lunch 2 slices whole-wheat bread (2 carbohydrate servings)  2 ounces lean [place] breast  1 ounce low-fat Swiss cheese  1 teaspoon mustard  1 slice tomato  1 lettuce leaf  1 small pear (1 carbohydrate serving)  1 cup skim milk (1 carbohydrate serving)  Afternoon Snack 1 ounce trail mix with unsalted nuts, seeds, and raisins (1 carbohydrate serving)  Evening Meal 3 ounces salmon  2/3 cup cooked brown rice (2 carbohydrate servings)  1 teaspoon soft margarine  1 cup cooked broccoli with 1/2 cup cooked carrots (1 carbohydrate serving  Carrots,  cooked, boiled, drained, without salt  1 cup lettuce  1 teaspoon olive oil with vinegar for dressing  1 small whole grain roll (1 carbohydrate serving)  1 teaspoon soft margarine  1 cup unsweetened tea  Evening Snack 1 extra-small banana (1 carbohydrate serving)  Copyright 2020  [employer]. All rights reserved.     Phosphorus Content Of Foods  Phosphorus is an important mineral that your body uses for energy and overall health. What you eat and drink can affect the amount of phosphorus in your body. The key to selecting foods with phosphorus is having the right balance for your needs.  Natural and Added Phosphorus Natural Phosphorus: Phosphorus occurs naturally in meats, dairy, grains, and vegetables. Your body absorbs about half of this natural phosphorus from foods and drinks.  Added Phosphorus: Phosphorus is also added to many foods and drinks as a preservative. Your body absorbs nearly all of the added phosphorus from foods and drinks.  How Much Phosphorus is in Food and Drinks? Nutrition Facts labels don't typically include phosphorus amounts and they don't identify whether the phosphorus in the product is natural or added. Read the ingredients list to check if a product label displays "phos" in the ingredients. This abbreviation will indicate for sure that phosphorus has been added. Ingredients with phosphorus that are most commonly added to food include disodium phosphate, sodium hexametaphosphate, phosphoric acid, calcium phosphate, and dipotassium phosphate. Foods and drinks with the highest added phosphorus are usually processed foods, packaged foods, and fast foods.  Phosphorus in Foods   Lower Phosphorus Choices   Higher Phosphorus Choices    Grains and Baked Goods Fresh breads, buns, dinner rolls, bagels English muffins, or pitas without "phos" ingredients Plain cereals such as oatmeal, corn flakes, rice crispies Reduced-salt crackers, rice  cakes, pretzels, popcorn, or tortilla chips without "phos" ingredients Processed breads and cereals with phosphorus additives on food label Biscuits, brownies, cakes, muffins, pancakes, pastries, or waffles that are ready-to-eat or made from a dry mix with "phos" on the label Refrigerated or frozen dough for biscuits, cookies, pastries, or sweet rolls with "phos" ingredients  Protein Foods All-natural chicken, [place], fish or seafood Lean and fresh beef, lamb, pork, veal, or wild game (3 ounces is size of palm of hand) Whole eggs or egg whites (1 egg is 1 ounce) Tofu, beans, lentils, hummus (1/4-1/3 cup) Unsalted nuts (1/4 cup) or nut butters (1 tablespoon) Processed meats like bacon, ham, hot dogs, chicken nuggets or strips, bologna, salami, or sausage Breaded or fried meats, chicken, fish or seafood Organ meats such as kidney or liver  Dairy and Dairy Alternatives Unfortified dairy alternates  such as almond or rice beverages Milk or soy beverage (1/2 cup) Cottage cheese with no "phos" ingredients (1/2 cup) Yogurt (6 ounces) all natural, unsweetened, or plain preferred Natural cheese such as brie, feta, Swiss, cheddar, or mozzarella.  Only have a small amount (1 ounce - size of your thumb or 2 dice) Regular or low-fat cream cheese, [place], or sour cream (1 tbsp) Sherbet, sorbet, fruit ice, or popsicles (1/2 cup) Non-dairy creamers and some half and half creamers with "phos"  Enriched dairy alternates such as almond, oat or rice beverages Processed cheese, such as American Processed cheese spreads and dips Fat free cream cheese or sour cream Ice cream, pudding or frozen yogurt Milk-based or cheese-based soups or sauces  Vegetables Fresh, frozen, or canned without added "phos" ingredients Vegetables with sauces added Frozen or packaged potatoes or vegetables with "phos" ingredients  Fruits Fresh, frozen or canned without added "phos" ingredients  Fresh, frozen or canned with added "phos"  ingredients  Beverages Water Fresh brewed coffee or tea Fresh lemonade or pure fruit juice (1/2 cup) Beverages without "phos" ingredients Beverage or powdered mix with "phos" ingredients: most colas, energy and sports drinks, canned or bottled coffees and teas, flavored waters and drink mixes. Beers and wines  Other (Fast, convenience or restaurant foods) Hamburgers, fish filet (no cheese), plain chicken wings Grilled, roasted, broiled, baked fish, chicken, [place], fish or seafood. Plain prepared eggs (no cheese, ham, bacon, sausage), [place] toast, English muffin, bagel, hot cereal, toast Pizza without meat or extra cheese Tacos, burritos, enchiladas fajitas with limited toppings. Choose white rice, lettuce, sauted onions, bell peppers on the side Tuna or egg salad sandwich (no cheese) Sides: salad without cheese, coleslaw, apple slices, applesauce, grapes, or carrots Meals with no "phos" ingredients and have less than 600 milligrams of sodium per serving Battered or fried fish or chicken including nuggets, sandwiches, strips or wings Pizza with meat and extra cheese Tacos, burritos, enchiladas with meat and toppings such as cheese and beans Hot dogs and sausages Any sandwich made with ham, processed deli meats, American cheese, or bacon [place] fries or other fried potatoes or battered vegetables, biscuits, or macaroni and cheese Meals or soups with "phos" ingredients and more than 600 millligrams of sodium per serving    Copyright 2020  [employer]. All rights reserved

## 2023-05-27 NOTE — Assessment & Plan Note (Addendum)
Labs stable with HD. Received only ultrafiltration yesterday. -nephrology consulted, appreciate recs -Renvala -monitor renal function -HD plan for today and tomorrow.

## 2023-05-27 NOTE — Assessment & Plan Note (Addendum)
Last echo 3/24 with EF 55-60%. 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

## 2023-05-27 NOTE — Progress Notes (Signed)
[facility]PH[facility] PROGRESS NOTE  Assessment/ Plan:  # Acute kidney Injury on CKD IIIb (b/l cr 1.8-2.0): Secondary to acute exacerbation of diastolic heart failure in the setting of continued cocaine abuse.  She was started on HD on 6/20 after tunneled catheter placement with IR  - Heart failure team has added a limited-time trial of milrinone  for RV support. -HD today, plan to UF around 1.5 L as tolerated.  She may need another HD tomorrow.  Will assess in AM. -I will inform renal navigator about need of dialysis outpatient for AKI.  #  Metabolic alkalosis: Now managing with dialysis.  # Acute exacerbation of diastolic heart failure: s/p right heart catheterization on 6/17.  Attempted diuretics without much help.  On milrinone for RV support by heart failure team.  UF with HD.  # Hypokalemia: Secondary to diuresis.  Improved    # Hyperphosphatemia: Secondary to acute kidney injury, will continue to trend phosphorus levels. Now on HD.    # HTN: Off of amlodipine, monitor BP.  Managing volume with HD.  Subjective: Seen and examined in dialysis unit.  Reportedly she had to cut treatment short yesterday because of Cramp.  Denies nausea, vomiting, chest pain.  Shortness of breath is improving. Objective Vital signs in last 24 hours: Vitals:   05/26/23 1728 05/26/23 1759 05/26/23 1825 05/27/23 0724  BP: 106/63 126/82 135/79 (!) 149/57  Pulse: (!) 109 (!) 106 (!) 108 (!) 112  Resp: 16 17 19 16   Temp:   98.1 F (36.7 C) 97.9 F (36.6 C)  TempSrc:    Oral  SpO2: 98% 98% 98%   Weight:   (!) 164.1 kg   Height:       Weight change:   Intake/Output Summary (Last 24 hours) at 05/27/2023 1057 Last data filed at 05/27/2023 0754 Gross per 24 hour  Intake 114.73 ml  Output 2125 ml  Net -2010.27 ml       Labs: RENAL PANEL Recent Labs  Lab 05/21/23 0143 05/22/23 0227 05/23/23 0226 05/23/23 0941 05/24/23 0221 05/25/23 0038 05/25/23 4098 05/26/23 0030  05/26/23 0758 05/27/23 0255  NA 134* 131* 129*   < > 131*  --  130* 132* 131* 131*  K 3.4* 3.4* 3.8   < > 4.2  --  4.2 4.2 4.4 3.9  CL 85* 88* 89*  --  91*  --  91* 91* 92* 95*  CO2 30 27 25   --  25  --  22 24 25 25   GLUCOSE 319* 337* 329*  --  371*  --  366* 294* 366* 301*  BUN 129* 124* 129*  --  132*  --  134* 142* 97* 73*  CREATININE 2.02* 1.89* 2.06*  --  2.21*  --  2.15* 2.43* 1.92* 2.04*  CALCIUM 9.7 9.1 8.9  --  9.1  --  9.3 9.0 8.7* 9.2  MG 2.0 1.8 1.7  --  2.0 1.9  --  1.9 1.8 2.2  PHOS 5.1* 4.5 4.8*  --  5.5*  --  5.8* 6.3* 4.6 4.9*  ALBUMIN 3.0* 3.0* 2.9*  --  2.9*  --   --  2.8*  --   --    < > = values in this interval not displayed.    Liver Function Tests: Recent Labs  Lab 05/23/23 0226 05/24/23 0221 05/26/23 0030  AST  --  19  --   ALT  --  25  --   ALKPHOS  --  163*  --  BILITOT  --  0.5  --   PROT  --  7.9  --   ALBUMIN 2.9* 2.9* 2.8*   No results for input(s): "LIPASE", "AMYLASE" in the last 168 hours. No results for input(s): "AMMONIA" in the last 168 hours. CBC: Recent Labs    05/17/23 0236 05/18/23 0119 05/23/23 0226 05/23/23 0941 05/24/23 0221 05/26/23 0030 05/27/23 0255  HGB 9.9*   < > 10.6* 11.9*  11.9* 11.0* 9.5* 10.7*  MCV 89.6   < > 90.0  --  89.5 92.7 90.7  FERRITIN 111  --   --   --   --   --   --   TIBC 437  --   --   --   --   --   --   IRON 43  --   --   --   --   --   --    < > = values in this interval not displayed.    Cardiac Enzymes: No results for input(s): "CKTOTAL", "CKMB", "CKMBINDEX", "TROPONINI" in the last 168 hours. CBG: Recent Labs  Lab 05/26/23 0753 05/26/23 1213 05/26/23 1704 05/26/23 2155 05/27/23 0742  GLUCAP 380* 512* 325* 328* 424*    Iron Studies: No results for input(s): "IRON", "TIBC", "TRANSFERRIN", "FERRITIN" in the last 72 hours. Studies/Results: IR Fluoro Guide CV Line Right  Result Date: 05/26/2023 INDICATION: Acute on chronic kidney disease. EXAM: FLUOROSCOPIC AND ULTRASOUND GUIDED  PLACEMENT OF A TUNNELED DIALYSIS CATHETER Physician: Rachelle Hora. Lowella Dandy, MD MEDICATIONS: Ancef 3 g; The antibiotic was administered within an appropriate time interval prior to skin puncture. ANESTHESIA/SEDATION: Moderate (conscious) sedation was employed during this procedure. A total of Versed 2mg  and fentanyl 100 mcg was administered intravenously at the order of the provider performing the procedure. Total intra-service moderate sedation time: 30 minutes. Patient's level of consciousness and vital signs were monitored continuously by radiology nurse throughout the procedure under the supervision of the provider performing the procedure. FLUOROSCOPY TIME:  Radiation Exposure Index (as provided by the fluoroscopic device): 28 mGy Kerma COMPLICATIONS: None immediate. PROCEDURE: The procedure was explained to the patient. The risks and benefits of the procedure were discussed and the patient's questions were addressed. Informed consent was obtained from the patient. The patient was placed supine on the interventional table. Ultrasound confirmed a patent right internal jugular vein. Ultrasound image obtained for documentation. The right neck and chest was prepped and draped in a sterile fashion. Maximal barrier sterile technique was utilized including caps, mask, sterile gowns, sterile gloves, sterile drape, hand hygiene and skin antiseptic. The right neck was anesthetized with 1% lidocaine. A small incision was made with #11 blade scalpel. A 21 gauge needle directed into the right internal jugular vein with ultrasound guidance. A micropuncture dilator set was placed. A 23 cm tip to cuff Palindrome catheter was selected. The skin below the right clavicle was anesthetized and a small incision was made with an #11 blade scalpel. A subcutaneous tunnel was formed to the vein dermatotomy site. The catheter was brought through the tunnel. The vein dermatotomy site was dilated to accommodate a peel-away sheath. The catheter was  placed through the peel-away sheath and directed into the central venous structures. The tip of the catheter was placed at superior cavoatrial junction with fluoroscopy. Fluoroscopic images were obtained for documentation. Both lumens were found to aspirate and flush well. The proper amount of heparin was flushed in both lumens. The vein dermatotomy site was closed using a single layer of  absorbable suture and Dermabond. Gel-Foam was placed in subcutaneous tract. The catheter was secured to the skin using Prolene suture. IMPRESSION: Successful placement of a right jugular tunneled dialysis catheter using ultrasound and fluoroscopic guidance. Electronically Signed   By: Richarda Overlie M.D.   On: 05/26/2023 08:38   IR US Guide Vasc Access Right  Result Date: 05/26/2023 INDICATION: Acute on chronic kidney disease. EXAM: FLUOROSCOPIC AND ULTRASOUND GUIDED PLACEMENT OF A TUNNELED DIALYSIS CATHETER Physician: Rachelle Hora. Lowella Dandy, MD MEDICATIONS: Ancef 3 g; The antibiotic was administered within an appropriate time interval prior to skin puncture. ANESTHESIA/SEDATION: Moderate (conscious) sedation was employed during this procedure. A total of Versed 2mg  and fentanyl 100 mcg was administered intravenously at the order of the provider performing the procedure. Total intra-service moderate sedation time: 30 minutes. Patient's level of consciousness and vital signs were monitored continuously by radiology nurse throughout the procedure under the supervision of the provider performing the procedure. FLUOROSCOPY TIME:  Radiation Exposure Index (as provided by the fluoroscopic device): 28 mGy Kerma COMPLICATIONS: None immediate. PROCEDURE: The procedure was explained to the patient. The risks and benefits of the procedure were discussed and the patient's questions were addressed. Informed consent was obtained from the patient. The patient was placed supine on the interventional table. Ultrasound confirmed a patent right internal  jugular vein. Ultrasound image obtained for documentation. The right neck and chest was prepped and draped in a sterile fashion. Maximal barrier sterile technique was utilized including caps, mask, sterile gowns, sterile gloves, sterile drape, hand hygiene and skin antiseptic. The right neck was anesthetized with 1% lidocaine. A small incision was made with #11 blade scalpel. A 21 gauge needle directed into the right internal jugular vein with ultrasound guidance. A micropuncture dilator set was placed. A 23 cm tip to cuff Palindrome catheter was selected. The skin below the right clavicle was anesthetized and a small incision was made with an #11 blade scalpel. A subcutaneous tunnel was formed to the vein dermatotomy site. The catheter was brought through the tunnel. The vein dermatotomy site was dilated to accommodate a peel-away sheath. The catheter was placed through the peel-away sheath and directed into the central venous structures. The tip of the catheter was placed at superior cavoatrial junction with fluoroscopy. Fluoroscopic images were obtained for documentation. Both lumens were found to aspirate and flush well. The proper amount of heparin was flushed in both lumens. The vein dermatotomy site was closed using a single layer of absorbable suture and Dermabond. Gel-Foam was placed in subcutaneous tract. The catheter was secured to the skin using Prolene suture. IMPRESSION: Successful placement of a right jugular tunneled dialysis catheter using ultrasound and fluoroscopic guidance. Electronically Signed   By: Richarda Overlie M.D.   On: 05/26/2023 08:38    Medications: Infusions:  sodium chloride     anticoagulant sodium citrate     milrinone 0.125 mcg/kg/min (05/27/23 0921)    Scheduled Medications:  aspirin EC  81 mg Oral Daily   atorvastatin  80 mg Oral Daily   brinzolamide  1 drop Both Eyes TID   And   brimonidine  1 drop Both Eyes TID   Chlorhexidine Gluconate Cloth  6 each Topical Q0600    gabapentin  300 mg Oral QHS   glipiZIDE  5 mg Oral Q breakfast   heparin injection (subcutaneous)  5,000 Units Subcutaneous Q8H   insulin aspart  0-20 Units Subcutaneous TID WC   insulin aspart  0-5 Units Subcutaneous QHS   insulin aspart  45 Units Subcutaneous TID AC   insulin glargine-yfgn  80 Units Subcutaneous BID   latanoprost  1 drop Both Eyes Daily   lidocaine  1 patch Transdermal Q24H   mometasone-formoterol  2 puff Inhalation BID   pantoprazole  40 mg Oral Daily   prednisoLONE acetate  1 drop Both Eyes QID   prochlorperazine  10 mg Oral Once   rOPINIRole  1 mg Oral QHS   sevelamer carbonate  800 mg Oral TID WC   sodium chloride flush  3 mL Intravenous Q12H    have reviewed scheduled and prn medications.  Physical Exam: General:NAD, comfortable, morbidly obese female Heart:RRR, s1s2 nl Lungs: Distant breath sound, Abdomen:soft, Non-tender, non-distended Extremities edema+ left BKA Dialysis Access: Right IJ TDC in place.  Eloyce Bultman Prasad Samary Shatz 05/27/2023,10:57 AM  LOS: 11 days

## 2023-05-27 NOTE — Progress Notes (Signed)
Family Medicine Teaching Service PCP Social Visit  Late entry - spoke with patient at bedside 6/19. She had just done HD for the first time the night prior.  We reviewed this as a major turning point in her medical trajectory, the addition of HD. Reviewed briefly the potential complications of long term dialysis, and discussed goals of care. For now, she wants to proceed with HD.  We also discussed whether she wants to place any limitations on medical interventions. She desires to be full code for now, but does NOT want any long term machine dependence. Would not want trach or feeding tube. She is aware that we will likely need to revisit these discussions in the future.  Patient appreciative of the visit. Please keep me as involved as possible in her care should there be further goals of care discussions this hospitalization. My pager is [phone]  Levert Feinstein, MD [facility]

## 2023-05-27 NOTE — Assessment & Plan Note (Addendum)
Poorly controlled DM with most recent A1c of 13.5 a month ago. Home meds include Tresiba 70 units twice daily and NovoLog 45u 3 times daily. Glucoses continue to be elevated. -Semglee to 90u BID -Novolog increase to 50u TID WC -Glipizide 5mg  daily -at bedtime sliding scale coverage -Resistant SSI with CBGs before every meal and nightly

## 2023-05-27 NOTE — Progress Notes (Signed)
Advanced Heart Failure Rounding Note  PCP-Cardiologist: Tonny Bollman, MD   Subjective:    [facility] cath placed yesterday.  Remains on milrinone 0.25 + IV lasix 80 mg q 8 hrs. Made 675  of urine.    Remains SOB with exertion.   Objective:   Weight Range: (!) 164.1 kg Body mass index is 66.17 kg/m.   Vital Signs:   Temp:  [97.9 F (36.6 C)-99.1 F (37.3 C)] 97.9 F (36.6 C) (06/21 0724) Pulse Rate:  [106-115] 112 (06/21 0724) Resp:  [16-87] 16 (06/21 0724) BP: (106-171)/(57-82) 149/57 (06/21 0724) SpO2:  [96 %-100 %] 98 % (06/20 1825) Weight:  [164.1 kg-164.5 kg] 164.1 kg (06/20 1825) Last BM Date : 05/24/23  Weight change: Filed Weights   05/25/23 0037 05/26/23 1528 05/26/23 1825  Weight: (!) 158.7 kg (!) 164.5 kg (!) 164.1 kg    Intake/Output:   Intake/Output Summary (Last 24 hours) at 05/27/2023 0924 Last data filed at 05/27/2023 0754 Gross per 24 hour  Intake 114.73 ml  Output 2125 ml  Net -2010.27 ml      Physical Exam  General:   No resp difficulty HEENT: normal Neck: supple. JVP 9-10. Carotids 2+ bilat; no bruits. No lymphadenopathy or thryomegaly appreciated. Cor: PMI nondisplaced. Regular rate & rhythm. No rubs, gallops or murmurs. Lungs: clear Abdomen: obese, soft, nontender, nondistended. No hepatosplenomegaly. No bruits or masses. Good bowel sounds. Extremities: no cyanosis, clubbing, rash, R and LLE 1+ edema. LBKA R TMA Neuro: alert & orientedx3, cranial nerves grossly intact. moves all 4 extremities w/o difficulty. Affect pleasant   Telemetry   ST 100s    EKG    N/A   Labs    CBC Recent Labs    05/26/23 0030 05/27/23 0255  WBC 11.4* 10.4  NEUTROABS 6.7  --   HGB 9.5* 10.7*  HCT 29.3* 33.0*  MCV 92.7 90.7  PLT 271 279   Basic Metabolic Panel Recent Labs    82/95/62 0758 05/27/23 0255  NA 131* 131*  K 4.4 3.9  CL 92* 95*  CO2 25 25  GLUCOSE 366* 301*  BUN 97* 73*  CREATININE 1.92* 2.04*  CALCIUM 8.7* 9.2  MG 1.8  2.2  PHOS 4.6 4.9*   Liver Function Tests Recent Labs    05/26/23 0030  ALBUMIN 2.8*   No results for input(s): "LIPASE", "AMYLASE" in the last 72 hours. Cardiac Enzymes No results for input(s): "CKTOTAL", "CKMB", "CKMBINDEX", "TROPONINI" in the last 72 hours.  BNP: BNP (last 3 results) Recent Labs    04/12/23 1231 04/25/23 1856 05/14/23 2240  BNP 19.5 22.8 19.3    ProBNP (last 3 results) No results for input(s): "PROBNP" in the last 8760 hours.   D-Dimer No results for input(s): "DDIMER" in the last 72 hours. Hemoglobin A1C No results for input(s): "HGBA1C" in the last 72 hours. Fasting Lipid Panel No results for input(s): "CHOL", "HDL", "LDLCALC", "TRIG", "CHOLHDL", "LDLDIRECT" in the last 72 hours. Thyroid Function Tests No results for input(s): "TSH", "T4TOTAL", "T3FREE", "THYROIDAB" in the last 72 hours.  Invalid input(s): "FREET3"  Other results:   Imaging    No results found.   Medications:     Scheduled Medications:  aspirin EC  81 mg Oral Daily   atorvastatin  80 mg Oral Daily   brinzolamide  1 drop Both Eyes TID   And   brimonidine  1 drop Both Eyes TID   Chlorhexidine Gluconate Cloth  6 each Topical Q0600   furosemide  80 mg Intravenous BID   gabapentin  300 mg Oral QHS   glipiZIDE  5 mg Oral Q breakfast   heparin injection (subcutaneous)  5,000 Units Subcutaneous Q8H   insulin aspart  0-20 Units Subcutaneous TID WC   insulin aspart  0-5 Units Subcutaneous QHS   insulin aspart  45 Units Subcutaneous TID AC   insulin glargine-yfgn  80 Units Subcutaneous BID   latanoprost  1 drop Both Eyes Daily   lidocaine  1 patch Transdermal Q24H   mometasone-formoterol  2 puff Inhalation BID   pantoprazole  40 mg Oral Daily   prednisoLONE acetate  1 drop Both Eyes QID   prochlorperazine  10 mg Oral Once   rOPINIRole  1 mg Oral QHS   sevelamer carbonate  800 mg Oral TID WC   sodium chloride flush  3 mL Intravenous Q12H    Infusions:  sodium  chloride     anticoagulant sodium citrate     milrinone 0.25 mcg/kg/min (05/27/23 0915)    PRN Medications: sodium chloride, acetaminophen, albuterol, alteplase, anticoagulant sodium citrate, diclofenac Sodium, heparin, ipratropium-albuterol, lidocaine (PF), lidocaine-prilocaine, nitroGLYCERIN, ondansetron (ZOFRAN) IV, mouth rinse, pentafluoroprop-tetrafluoroeth, polyethylene glycol, sodium chloride flush    Patient Profile   47 y.o. 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  Admitted w/ a/c RV failure, c/b progressive renal failure   Assessment/Plan   1. Acute on Chronic Diastolic Heart Failure w/ Prominent RV Dysfunction  - Echo 3/24 EF 55-60%, RV not well visualized - RHC this admit c/w RV failure w/ marginal cardiac output, likely from chronic OHS/OSA (PAPi 1, mRA 18, PA 53/34, PCWP 22, CI 2.2) - Will need long term HF. . - Will continue milrinone for RV support but start to wean today  Hopefully offloading RV w/ HD can help w/ output.  - stop IV lasix.    no digoxin w/ CKD  - not candidate for SGLTi w/ poorly controlled DM and body habitus/ risk for GU infections    2. Pulmonary HTN - RHC per above, suspect 2/2 chronic OHS/OSA - continue supp O2 support, BiPAP at night + diuretics  3. A/c CKD IIIb - underlying diabetic nephropathy c/b cardiorenal syndrome  - THD cath placed - Tolerated iHD  per nephrology   4. Polysubstance Abuse - UDS + for cocaine  - imperative to quit    5. IDA - likely anemia of chronic disease from CKD  - Tsat 10%  - received 4 doses of IV Fe    6. Super Morbid Obesity  Body mass index is 66.17  kg/m.    7. Poorly Controlled T2DM w/ Complications - w/ CKD, retinopathy, HF and b/l LE amputations  - Recent Hgb A1c 13 on Insulin  - management per Family Medicine - followed by outpatient endocrinology    8. OHS/OSA - supp daytime O2 + BiPAP at bedtime - followed by outpatient pulmonology   GOC: Attempting volume removal with milrinone and iHD. Unfortunately, she likely has a very poor long-term prognosis. Consult Palliative Care . Dr Gasper Lloyd discussed..   Length of Stay: 11  Tonye Becket, NP  05/27/2023, 9:24 AM  Advanced Heart Failure Team Pager [phone] (M-F; 7a - 5p)  Please  contact [facility] for night-coverage after hours (5p -7a ) and weekends on [email or web address]

## 2023-05-27 NOTE — Progress Notes (Signed)
Received patient in bed .Awake,alert and oriented x 4.Consent verified.  Medicine given :Tylenol 650 mg.                           Zofran 4 mg IV.  Access used : Left HD catheter that worked well.Dressing on date.  Duration of treatment: 2 hours out 2.5 prescribed.  Fluid removed: 800 cc out of 1.5 liters prescribed.  Hemo comment: She has had severe abdominal and back cramping on her last hour of treatment and several big blood clots in the air chamber that clogged the circuit on the last remaining  30 minutes of her treatment.  Hand off to the patient's nurse.

## 2023-05-27 NOTE — Plan of Care (Signed)

## 2023-05-28 DIAGNOSIS — Z66 Do not resuscitate: Secondary | ICD-10-CM | POA: Diagnosis not present

## 2023-05-28 DIAGNOSIS — Z7189 Other specified counseling: Secondary | ICD-10-CM

## 2023-05-28 DIAGNOSIS — N179 Acute kidney failure, unspecified: Secondary | ICD-10-CM

## 2023-05-28 DIAGNOSIS — N189 Chronic kidney disease, unspecified: Secondary | ICD-10-CM | POA: Diagnosis not present

## 2023-05-28 DIAGNOSIS — I519 Heart disease, unspecified: Secondary | ICD-10-CM | POA: Diagnosis not present

## 2023-05-28 DIAGNOSIS — G8929 Other chronic pain: Secondary | ICD-10-CM

## 2023-05-28 DIAGNOSIS — Z515 Encounter for palliative care: Secondary | ICD-10-CM | POA: Diagnosis not present

## 2023-05-28 LAB — PHOSPHORUS: Phosphorus: 5.6 mg/dL — ABNORMAL HIGH (ref 2.5–4.6)

## 2023-05-28 LAB — BASIC METABOLIC PANEL
Anion gap: 13 (ref 5–15)
BUN: 74 mg/dL — ABNORMAL HIGH (ref 6–20)
CO2: 26 mmol/L (ref 22–32)
Calcium: 9 mg/dL (ref 8.9–10.3)
Chloride: 96 mmol/L — ABNORMAL LOW (ref 98–111)
Creatinine, Ser: 2.49 mg/dL — ABNORMAL HIGH (ref 0.44–1.00)
GFR, Estimated: 24 mL/min — ABNORMAL LOW (ref >60)
Glucose, Bld: 207 mg/dL — ABNORMAL HIGH (ref 70–99)
Potassium: 3.9 mmol/L (ref 3.5–5.1)
Sodium: 135 mmol/L (ref 135–145)

## 2023-05-28 LAB — MAGNESIUM: Magnesium: 2 mg/dL (ref 1.7–2.4)

## 2023-05-28 LAB — GLUCOSE, CAPILLARY
Glucose-Capillary: 237 mg/dL — ABNORMAL HIGH (ref 70–99)
Glucose-Capillary: 295 mg/dL — ABNORMAL HIGH (ref 70–99)
Glucose-Capillary: 283 mg/dL — ABNORMAL HIGH (ref 70–99)

## 2023-05-28 MED ORDER — GABAPENTIN 300 MG PO CAPS: 300.0000 mg | ORAL_CAPSULE | Freq: Every day | ORAL | 0 refills | Status: DC

## 2023-05-28 MED ORDER — ATORVASTATIN CALCIUM 80 MG PO TABS: 80.0000 mg | ORAL_TABLET | Freq: Every day | ORAL | 0 refills | Status: DC

## 2023-05-28 MED ORDER — CHLORHEXIDINE GLUCONATE CLOTH 2 % EX PADS: 6.0000 | MEDICATED_PAD | Freq: Every day | CUTANEOUS | Status: DC

## 2023-05-28 MED ORDER — SEVELAMER CARBONATE 800 MG PO TABS: 800.0000 mg | ORAL_TABLET | Freq: Three times a day (TID) | ORAL | 0 refills | Status: DC

## 2023-05-28 NOTE — Progress Notes (Signed)
Daily Progress Note Intern Pager: [phone]  Patient name: [name] Medical record number: [id number] Date of birth: 1976/10/13 Age: 47 y.o. Gender: female  Primary Care Provider: Latrelle Dodrill, MD Consultants: Cardiology, Nephrology Code Status: Full Code   Pt Overview and Major Events to Date:  6/9 - admitted    Assessment and Plan: [name] is a 47 y.o. female admitted for ACS work-up in the setting of recent RHC, heart failure, significant kidney disease, diabetes, and OSA. Now ongoing dialysis and continuing medication optimization of cardiac and renal medications.    Hospital Problem List      Hospital     * (Principal) Acute on chronic kidney failure ([name])     Labs stable with HD.  -nephrology consulted, appreciate recs -Renvala -monitor renal function -will need outpatient dialysis arranged        Type 2 diabetes mellitus with hyperglycemia ([name])     Poorly controlled DM with most recent A1c of 13.5 a month ago. Home  meds include Tresiba 70 units twice daily and NovoLog 45u 3 times daily.  Glucoses continue to be elevated. -Semglee 90u BID -Novolog 50u TID WC -Glipizide 5mg  daily -at bedtime sliding scale coverage -Resistant SSI with CBGs before every meal and nightly        RESOLVED: Chest pain     Patient has chronic history of chest pain likely multifactorial due to  cocaine use/MSK; however, is very high risk for ACS.  -Appreciate HF team recs, diuresis as noted -cardiac monitoring -ASA, statin        Acute on chronic diastolic heart failure ([name])     Last echo 3/24 with EF 55-60%. RHC demonstrating continued RV failure.  -8.7L since admit. -Appreciate palliative involvement given poor prognosis -Heart failure signed off, appreciate their recs -Appreciate nephrology, managing volume status with HD -No longer on milrinone given no improvement -Strict I/os -Daily weights        RVF (right ventricular failure) ([name])     Chronic  neuropathic pain     Stable, on gabapentin and requip       FEN/GI: Carb modified PPx: Heparin subQ Dispo:Pending clinical improvement, will need outpatient HD set up   Subjective:  NAEON, asleep comfortably on BiPAP  Objective: Temp:  [97.7 F (36.5 C)-98.2 F (36.8 C)] 98.2 F (36.8 C) (06/21 2100) Pulse Rate:  [102-112] 102 (06/21 2100) Resp:  [16-22] 18 (06/21 2100) BP: (105-161)/(57-92) 114/67 (06/21 2100) SpO2:  [96 %-99 %] 99 % (06/21 2100) Weight:  [159.6 kg-160.2 kg] 159.6 kg (06/21 1458) Physical Exam: General: NAD, sleeping comfortably on BiPAP Cardiovascular: RRR on monitor, difficult to auscultate given body habitus Respiratory: Normal WOB on BiPAP difficult to auscultate Abdomen: Soft, nontender Extremities: Compression stockings BLE  Laboratory: Most recent CBC Lab Results  Component Value Date   WBC 10.4 05/27/2023   HGB 10.7 (L) 05/27/2023   HCT 33.0 (L) 05/27/2023   MCV 90.7 05/27/2023   PLT 279 05/27/2023   Most recent BMP    Latest Ref Rng & Units 05/28/2023    2:35 AM  BMP  Glucose 70 - 99 mg/dL 147   BUN 6 - 20 mg/dL 74   Creatinine 8.29 - 1.00 mg/dL 5.62   Sodium 130 - 865 mmol/L 135   Potassium 3.5 - 5.1 mmol/L 3.9   Chloride 98 - 111 mmol/L 96   CO2 22 - 32 mmol/L 26   Calcium 8.9 - 10.3 mg/dL 9.0  Magnesium 2.0 Phosphorus 5.6  Vonna Drafts, MD 05/28/2023, 5:28 AM  PGY-1, [facility] FPTS Intern pager: [phone], text pages welcome Secure chat group [facility] Teaching Service

## 2023-05-28 NOTE — Assessment & Plan Note (Signed)
Stable, on gabapentin and requip

## 2023-05-28 NOTE — TOC Transition Note (Addendum)
Transition of Care [facility]) - CM/SW Discharge Note   Patient Details  Name: [name] MRN: [id number] Date of Birth: Oct 08, 1976  Transition of Care [facility]) CM/SW Contact:  Lawerance Sabal, RN Phone Number: 05/28/2023, 2:52 PM   Clinical Narrative:     Spoke with patient at bedside. Confirmed she would like to go home w hospice services.  Discussed local providers and she would like to use [facility].  Referral accepted by [name] with tentative start date for tomorrow.  Patient states she has "everything" as far as DME at home, including oxygen.  She lives with her daughter who she states can offer help at home.  Home is  [facility] at [address] [place] [address]  Patient will need PTAR transport home.  Updated MD that [facility] is set up and patient requesting DC order.   PTAR forms on chart, instructed bedside nurse on how to call PTAR if orders for DC are placed after I leave   Final next level of care: Home w Hospice Care Barriers to Discharge: No Barriers Identified   Patient Goals and CMS Choice      Discharge Placement                         Discharge Plan and Services Additional resources added to the After Visit Summary for     Discharge Planning Services: CM Consult            DME Arranged: N/A           HH Agency: [facility] Date HH Agency Contacted: 05/28/23 Time HH Agency Contacted: 1452 Representative spoke with at [facility] Agency: [name]  Social Determinants of Health (SDOH) Interventions SDOH Screenings   Food Insecurity: Food Insecurity Present (05/15/2023)  Housing: Low Risk  (05/15/2023)  Recent Concern: Housing - Medium Risk (04/27/2023)  Transportation Needs: No Transportation Needs (05/15/2023)  Utilities: Not At Risk (05/15/2023)  Alcohol Screen: Low Risk  (04/14/2023)  Depression (PHQ2-9): Medium Risk (03/07/2023)  Financial Resource Strain: High Risk (05/25/2023)  Stress: Stress Concern Present  (02/19/2021)  Tobacco Use: Medium Risk (05/26/2023)     Readmission Risk Interventions    03/22/2023   10:47 AM 05/24/2022   11:33 AM  Readmission Risk Prevention Plan  Transportation Screening Complete Complete  Medication Review Oceanographer) Complete Complete  PCP or Specialist appointment within 3-5 days of discharge Complete Complete  HRI or Home Care Consult Complete Complete  SW Recovery Care/Counseling Consult Complete Complete  Palliative Care Screening Not Applicable Not Applicable  Skilled Nursing Facility Not Applicable Patient Refused

## 2023-05-28 NOTE — Assessment & Plan Note (Addendum)
Ongoing iHD, 800cc removed 6/21.  -nephrology consulted, appreciate recs -Renvala -monitor renal function -will need outpatient dialysis arranged

## 2023-05-28 NOTE — Progress Notes (Signed)
   Referral received for hospice services at home. I have talked with the pt herself and confirmed interest in hospice care when she is d/c. She is in agreement and my medical MD has approved her for hospice care. She is in agreement with [place] enrolling into hospice care at 1000am tomorrow morning. Pt confirms she has all equipment needed in home.   Norm Parcel RN

## 2023-05-28 NOTE — Progress Notes (Signed)
Mobility Specialist Progress Note    05/28/23 1222  Mobility  Activity Ambulated with assistance in room  Level of Assistance Contact guard assist, steadying assist  Assistive Device Front wheel walker  Distance Ambulated (ft) 10 ft (5+5)  Activity Response Tolerated well  Mobility Referral Yes  $Mobility charge 1 Mobility  Mobility Specialist Start Time (ACUTE ONLY) 1129  Mobility Specialist Stop Time (ACUTE ONLY) 1220  Mobility Specialist Time Calculation (min) (ACUTE ONLY) 51 min   Pt received in chair requesting to go to chapel. RN agreeable. Assisted pt to transport chair. Returned to room and assisted patient back to recliner. RN aware.   [place] Mobility Specialist  Please Neurosurgeon or Rehab Office at [phone]

## 2023-05-28 NOTE — Progress Notes (Signed)
   05/28/23 1218  Spiritual Encounters  Type of Visit Initial  Care provided to: Patient  Conversation partners present during encounter Nurse  Referral source Nurse (RN/NT/LPN)  Reason for visit Routine spiritual support  OnCall Visit Yes   RN brought PT to the chapel. PT has signed a DNR today and is coming to terms with the reality of her health condition and the severity of it.  PT stated she wanted to come to the chapel and "talk to grandma".  Chaplain spoke with PT and practiced a brief life review, while establishing a compassionate presence. PT stated that she knows the end is near because she has been having dreams of her grandmother holding out her arms and telling her to come on. PT stated she is scared.  Chaplain helped PT process what the image of her grandmother mean to her- comfort, peace, and love.  PT said she realized that these dreams aren't about scaring her but about calming her and giving her peace. After approx 30 minutes of conversation PT stated "I know what I need to do, I just have to tell my daughter." Chaplain offered to be present and hel her with that conversation if PT wanted. PT will call spiritual care if she wants chaplain present.

## 2023-05-28 NOTE — Assessment & Plan Note (Signed)
Poorly controlled DM with most recent A1c of 13.5 a month ago. Home meds include Tresiba 70 units twice daily and NovoLog 45u 3 times daily. Glucoses continue to be elevated. -Semglee 90u BID -Novolog 50u TID WC -Glipizide 5mg  daily -at bedtime sliding scale coverage -Resistant SSI with CBGs before every meal and nightly

## 2023-05-28 NOTE — Progress Notes (Addendum)
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  She was started on HD on 6/20 after tunneled catheter placement with IR. -She received 3 dialysis treatment but mostly limited ultrafiltration because of cramping and poor heart condition.  She still has significant fluid overload and dyspneic.  We will attempt another HD today.  Noted off of milrinone.  The outpatient dialysis might be challenging for her.  She is DNR and consulting palliative care team. Renal navigator was already contacted to arrange outpatient HD for AKI.  #  Metabolic alkalosis: Now managing with dialysis.  # Acute exacerbation of diastolic heart failure: s/p right heart catheterization on 6/17.  Attempted diuretics without much help.  No improvement with milrinone therefore CHF team DC milrinone for RV failure, not a candidate for advanced treatment.  # Hypokalemia: Secondary to diuresis.  Improved    # Hyperphosphatemia: Secondary to acute kidney injury, will continue to trend phosphorus levels. Now on HD.    # HTN: Off of amlodipine, monitor BP.  Managing volume with HD.  Addendum 3;23 PM: Received message and secure chat from the primary team and multidisciplinary team that patient is going home with hospice and no longer wanting to continue dialysis. I canceled HD orders today and informed the nurse. I recommend to remove HD catheter No further dialysis, agree with home with hospice. Please call us back with any question. Communicated with the primary team via secure chat..  Subjective: Seen and examined.  Reports shortness of breath and overall not feeling well.  Denies nausea, chest pain.  Dialysis has been limited per UF and she reports some cramping as well.  Objective Vital signs in last 24 hours: Vitals:   05/27/23 1335 05/27/23  1345 05/27/23 1458 05/27/23 2100  BP: (!) 136/91 (!) 105/91  114/67  Pulse: (!) 104 (!) 105  (!) 102  Resp: 18 16  18   Temp:  97.7 F (36.5 C)  98.2 F (36.8 C)  TempSrc:  Oral  Oral  SpO2: 98% 96%  99%  Weight:   (!) 159.6 kg   Height:       Weight change: -4.3 kg  Intake/Output Summary (Last 24 hours) at 05/28/2023 1052 Last data filed at 05/28/2023 1610 Gross per 24 hour  Intake 1020.89 ml  Output 1201 ml  Net -180.11 ml        Labs: RENAL PANEL Recent Labs  Lab 05/22/23 0227 05/23/23 0226 05/23/23 0941 05/24/23 0221 05/25/23 0038 05/25/23 9604 05/26/23 0030 05/26/23 0758 05/27/23 0255 05/28/23 0235  NA 131* 129*   < > 131*  --  130* 132* 131* 131* 135  K 3.4* 3.8   < > 4.2  --  4.2 4.2 4.4 3.9 3.9  CL 88* 89*  --  91*  --  91* 91* 92* 95* 96*  CO2 27 25  --  25  --  22 24 25 25 26   GLUCOSE 337* 329*  --  371*  --  366* 294* 366* 301* 207*  BUN 124* 129*  --  132*  --  134* 142* 97* 73* 74*  CREATININE 1.89* 2.06*  --  2.21*  --  2.15* 2.43* 1.92* 2.04* 2.49*  CALCIUM 9.1 8.9  --  9.1  --  9.3 9.0 8.7* 9.2 9.0  MG 1.8 1.7  --  2.0 1.9  --  1.9 1.8 2.2 2.0  PHOS 4.5 4.8*  --  5.5*  --  5.8* 6.3* 4.6 4.9* 5.6*  ALBUMIN 3.0* 2.9*  --  2.9*  --   --  2.8*  --   --   --    < > = values in this interval not displayed.     Liver Function Tests: Recent Labs  Lab 05/23/23 0226 05/24/23 0221 05/26/23 0030  AST  --  19  --   ALT  --  25  --   ALKPHOS  --  163*  --   BILITOT  --  0.5  --   PROT  --  7.9  --   ALBUMIN 2.9* 2.9* 2.8*    No results for input(s): "LIPASE", "AMYLASE" in the last 168 hours. No results for input(s): "AMMONIA" in the last 168 hours. CBC: Recent Labs    05/17/23 0236 05/18/23 0119 05/23/23 0226 05/23/23 0941 05/24/23 0221 05/26/23 0030 05/27/23 0255  HGB 9.9*   < > 10.6* 11.9*  11.9* 11.0* 9.5* 10.7*  MCV 89.6   < > 90.0  --  89.5 92.7 90.7  FERRITIN 111  --   --   --   --   --   --   TIBC 437  --   --   --   --   --    --   IRON 43  --   --   --   --   --   --    < > = values in this interval not displayed.     Cardiac Enzymes: No results for input(s): "CKTOTAL", "CKMB", "CKMBINDEX", "TROPONINI" in the last 168 hours. CBG: Recent Labs  Lab 05/27/23 0742 05/27/23 1454 05/27/23 1643 05/27/23 2105 05/28/23 0757  GLUCAP 424* 250* 319* 255* 237*     Iron Studies: No results for input(s): "IRON", "TIBC", "TRANSFERRIN", "FERRITIN" in the last 72 hours. Studies/Results: No results found.  Medications: Infusions:  sodium chloride      Scheduled Medications:  aspirin EC  81 mg Oral Daily   atorvastatin  80 mg Oral Daily   brinzolamide  1 drop Both Eyes TID   And   brimonidine  1 drop Both Eyes TID   Chlorhexidine Gluconate Cloth  6 each Topical Q0600   Chlorhexidine Gluconate Cloth  6 each Topical Q0600   gabapentin  300 mg Oral QHS   glipiZIDE  5 mg Oral Q breakfast   heparin injection (subcutaneous)  5,000 Units Subcutaneous Q8H   insulin aspart  0-20 Units Subcutaneous TID WC   insulin aspart  0-5 Units Subcutaneous QHS   insulin aspart  50 Units Subcutaneous TID AC   insulin glargine-yfgn  90 Units Subcutaneous BID   latanoprost  1 drop Both Eyes Daily   lidocaine  1 patch Transdermal Q24H   mometasone-formoterol  2 puff Inhalation BID   pantoprazole  40 mg Oral Daily   prednisoLONE acetate  1 drop Both Eyes QID   prochlorperazine  10 mg Oral Once   rOPINIRole  1 mg Oral QHS   sevelamer carbonate  800 mg Oral TID WC   sodium chloride flush  3 mL Intravenous Q12H    have reviewed scheduled and prn medications.  Physical Exam: General: Mild dyspneic while moving out of bed. Heart:RRR, s1s2 nl Lungs: Distant breath sound, Abdomen:soft, Non-tender, non-distended Extremities edema+ left BKA Dialysis Access: Right IJ TDC in place.  Shawnya Mayor Prasad Gia Lusher 05/28/2023,10:52 AM  LOS: 12 days

## 2023-05-28 NOTE — Assessment & Plan Note (Addendum)
Last echo 3/24 with EF 55-60%. RHC demonstrating continued RV failure. -8.7L since admit. -Appreciate palliative involvement given poor prognosis -Heart failure signed off, appreciate their recs -Appreciate nephrology, managing volume status with HD -No longer on milrinone given no improvement -Strict I/os -Daily weights

## 2023-05-28 NOTE — Progress Notes (Signed)
   05/28/23 2026  BiPAP/CPAP/SIPAP  BiPAP/CPAP/SIPAP Pt Type Adult  BiPAP/CPAP/SIPAP Resmed  Reason BIPAP/CPAP not in use  (Standby at bedside, pt puts herself on.)

## 2023-05-28 NOTE — Consult Note (Signed)
Palliative Medicine Inpatient Consult Note  Consulting Provider: Tonye Becket  Reason for consult:   Palliative Care Consult Services Palliative Medicine Consult  Reason for Consult? HF end stage RV failure   05/28/2023  HPI:  Per intake H&P --> 47 y.o. 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 Admitted w/ a/c RV failure, c/b progressive renal failure.  Palliative care has been asked to get involved to support additional goals of care conversations in the setting of end stage heart failure.  Clinical Assessment/Goals of Care:  *Please note that this is a verbal dictation therefore any spelling or grammatical errors are due to the "Dragon Medical One" system interpretation.  I have reviewed medical records including EPIC notes, labs and imaging, received report from bedside RN, assessed the patient who is sitting in the recliner chair tearful upon my entrance into the room.    I met with [name] to further discuss diagnosis prognosis, GOC, EOL wishes, disposition and options.   I introduced Palliative Medicine as specialized medical care for people living with serious illness. It focuses on providing relief from the symptoms and stress of a serious illness. The goal is to improve quality of life for both the patient and the family.  Medical History Review and Understanding:  A discussion of [name]'s past medical history inclusive of her diabetes, peripheral arterial disease requiring a left leg below the knee amputation and right transmetatarsal amputations, CKD, obesity, chronic respiratory failure obstructive sleep apnea fairly her and  now severe heart failure was held.  Social History:  [name] shares with me that she is from [place] originally.  She is not married though does have one 67 year old daughter.  She has 2 sisters who she is close with on her mother side and 2 sisters who she is close with on her father side.  The other 5 siblings on her father side she does not speak to. [name] has not been able to work in the setting of chronic illness.  She is a woman of faith and practices within Christianity.  Functional and Nutritional State:  Preceding hospitalization [name] was living in a motel.  She is able to stand and pivot and has an electric wheelchair as well as a walker.  She was able to perform some B ADLs and does have a CNA who comes to help her with the ones which she was not able to complete. [name] has maintained a good appetite.  Advance Directives:  A detailed discussion was had today regarding advanced directives.  Patient completed advanced directives in my presence and would like her more sister [name] fields to be her surrogate decision maker phone number [phone].  Code Status:  Concepts specific to code status, artifical feeding and hydration, continued IV antibiotics and rehospitalization was had.    I completed a MOST form today. The patient and family outlined their wishes for the following treatment decisions:  Cardiopulmonary Resuscitation: Do Not Attempt Resuscitation (DNR/No CPR)  Medical Interventions: Comfort Measures: Keep clean, warm, and dry. Use medication by any route, positioning, wound care, and other measures to relieve pain and suffering. Use oxygen, suction and manual treatment of airway obstruction as needed for comfort. Do not  transfer to the hospital unless comfort needs cannot be met in current location.  Antibiotics: Determine use of limitation of antibiotics when infection occurs  IV Fluids: IV fluids for a defined trial period  Feeding Tube: No feeding  tube   Encouraged patient/family to consider DNR/DNI status understanding evidenced based poor outcomes in similar hospitalized patient, as the cause of arrest is likely associated with advanced chronic/terminal illness rather than an easily reversible acute cardio-pulmonary event. I explained that DNR/DNI does not change the medical plan and it only comes into effect after a person has arrested (died).  It is a protective measure to keep [place] from harming the patient in their last moments of life. [name] was agreeable to DNR/DNI with understanding that patient would not receive CPR, defibrillation, ACLS medications, or intubation.   Provided  "Hard Choices for Loving People" booklet -unfortunately patient shared she is not able to read this.  Discussion:  [name] remained to be tearful throughout the duration of her time together.  She expresses that the trajectory of her life is changed within a 2-week period of time.  We discussed the severity of her heart failure and the ongoing reality that this is at end-stage.  We reviewed her present trial of dialysis and she shares with me that they are not able to get enough fluid off.  We reviewed potentially the risks moving forward in the setting of her underlying disease.  We discussed the options of continuing aggressive medical care versus comfort/hospice based care.  [name] expresses that her main goal at this time is to get to her home as she has something she would like to finish up. I described hospice as a service for patients who have a life expectancy of 6 months or less. The goal of hospice is the preservation of dignity and quality at the end phases of life. Under hospice care, the focus changes from curative to symptom relief.  I did express that under hospice care [name] could not receive hemodialysis which at this time she endorses she is not ready to stop.  We reviewed that the palliative team would continue ongoing support.   [name] is interested  in learning more about hospice and palliative care services outside of the hospital for which I shared I would set up an educational session with the liaison.  Romina request to go down to the [place].  I shared that I will figure out a way to coordinate this and make it a reality.  Discussed the importance of continued conversation with family and their  medical providers regarding overall plan of care and treatment options, ensuring decisions are within the context of the patients values and GOCs. ______________________________________ Addendum:  I was asked to come back to bedside by patient's RN, Elease Hashimoto.   Both myself and Dr. Barbaraann Faster went to bedside this afternoon.  During that time Chudney clearly expressed that she no longer wants aggressive measures to maintain life.  She expresses that she does not see the utility in performing dialysis given the inability to get sufficient fluid off of Velvia.   Maheen shares that she does want to go back to her motel with hospice support.  She expresses that she wants to leave as soon as possible.  We again discussed the differences between home hospice and inpatient hospice.  I shared that is very likely her symptom burden could accumulate rather quickly in which case inpatient hospice may be the best course of action.  Frances wishes to proceed  with going home as she has some things such as a durable will she wants to complete as well as having a fish fry with her family prior to her final days on earth.  Dr. Barbaraann Faster was able to support Charday's decisions and provide updates to her sisters [name] and Drue Flirt over the phone.  Goals at this time will be for discharge with hospice support once all DME and nursing visits can be arranged.  Decision Maker: Conrad Pantego   [phone] Patients wants called first.   SUMMARY OF RECOMMENDATIONS   DNAR/DNI  Appreciate TOC arranging home hospice  --> [facility]  Plan for discharge  per primary team once all DME is in place  Ongoing palliative support  Code Status/Advance Care Planning: DNAR/DNI  Palliative Prophylaxis:  Aspiration, Bowel Regimen, Delirium Protocol, Frequent Pain Assessment, Oral Care, Palliative Wound Care, and Turn Reposition  Additional Recommendations (Limitations, Scope, Preferences): Continue current care  Psycho-social/Spiritual:  Desire for further Chaplaincy support: Yes Additional Recommendations: Education on current medical illnesses   Prognosis: Limited 2 weeks  Discharge Planning: Discharge home with hospice per primary team  Vitals:   05/27/23 1345 05/27/23 2100  BP: (!) 105/91 114/67  Pulse: (!) 105 (!) 102  Resp: 16 18  Temp: 97.7 F (36.5 C) 98.2 F (36.8 C)  SpO2: 96% 99%    Intake/Output Summary (Last 24 hours) at 05/28/2023 0723 Last data filed at 05/27/2023 1753 Gross per 24 hour  Intake 1270.89 ml  Output 1351 ml  Net -80.11 ml   Last Weight  Most recent update: 05/27/2023  2:58 PM    Weight  159.6 kg (351 lb 13.7 oz)               Gen: Middle aged Caucasian obese female in no acute distress HEENT: moist mucous membranes CV: Irregular rate and regular rhythm  PULM: On 5 L nasal cannula breathing is unlabored ABD: soft/nontender  EXT: Left AKA Neuro: Alert and oriented x3   PPS: 40%   This conversation/these recommendations were discussed with patient primary care team, Dr. Manson Passey  Total Time: 120  Billing based on MDM: High  Problems Addressed: One acute or chronic illness or injury that poses a threat to life or bodily function  Amount and/or Complexity of Data: Category 3:Discussion of management or test interpretation with external physician/other qualified health care professional/appropriate source (not separately reported)  Risks: Decision regarding hospitalization or escalation of hospital care and Decision not to resuscitate or to de-escalate care because of poor  prognosis ______________________________________________________ Lamarr Lulas [facility] Palliative Medicine Team Team Cell Phone: [phone] Please utilize secure chat with additional questions, if there is no response within 30 minutes please call the above phone number  Palliative Medicine Team providers are available by phone from 7am to 7pm daily and can be reached through the team cell phone.  Should this patient require assistance outside of these hours, please call the patient's attending physician.

## 2023-05-29 DIAGNOSIS — J449 Chronic obstructive pulmonary disease, unspecified: Secondary | ICD-10-CM | POA: Diagnosis not present

## 2023-05-29 DIAGNOSIS — I5032 Chronic diastolic (congestive) heart failure: Secondary | ICD-10-CM | POA: Diagnosis not present

## 2023-05-29 DIAGNOSIS — E1151 Type 2 diabetes mellitus with diabetic peripheral angiopathy without gangrene: Secondary | ICD-10-CM | POA: Diagnosis not present

## 2023-05-29 DIAGNOSIS — Z89512 Acquired absence of left leg below knee: Secondary | ICD-10-CM | POA: Diagnosis not present

## 2023-05-29 DIAGNOSIS — E1122 Type 2 diabetes mellitus with diabetic chronic kidney disease: Secondary | ICD-10-CM | POA: Diagnosis not present

## 2023-05-29 DIAGNOSIS — E662 Morbid (severe) obesity with alveolar hypoventilation: Secondary | ICD-10-CM | POA: Diagnosis not present

## 2023-05-29 DIAGNOSIS — F1110 Opioid abuse, uncomplicated: Secondary | ICD-10-CM | POA: Diagnosis not present

## 2023-05-29 DIAGNOSIS — N186 End stage renal disease: Secondary | ICD-10-CM | POA: Diagnosis not present

## 2023-05-29 DIAGNOSIS — F1410 Cocaine abuse, uncomplicated: Secondary | ICD-10-CM | POA: Diagnosis not present

## 2023-05-29 DIAGNOSIS — Z89421 Acquired absence of other right toe(s): Secondary | ICD-10-CM | POA: Diagnosis not present

## 2023-05-30 ENCOUNTER — Telehealth: Payer: Self-pay | Admitting: Student

## 2023-05-30 ENCOUNTER — Telehealth (HOSPITAL_BASED_OUTPATIENT_CLINIC_OR_DEPARTMENT_OTHER): Payer: Self-pay | Admitting: Licensed Clinical Social Worker

## 2023-05-30 DIAGNOSIS — J449 Chronic obstructive pulmonary disease, unspecified: Secondary | ICD-10-CM | POA: Diagnosis not present

## 2023-05-30 DIAGNOSIS — N186 End stage renal disease: Secondary | ICD-10-CM | POA: Diagnosis not present

## 2023-05-30 DIAGNOSIS — I5032 Chronic diastolic (congestive) heart failure: Secondary | ICD-10-CM | POA: Diagnosis not present

## 2023-05-30 DIAGNOSIS — E1151 Type 2 diabetes mellitus with diabetic peripheral angiopathy without gangrene: Secondary | ICD-10-CM | POA: Diagnosis not present

## 2023-05-30 DIAGNOSIS — F1410 Cocaine abuse, uncomplicated: Secondary | ICD-10-CM | POA: Diagnosis not present

## 2023-05-30 DIAGNOSIS — F1110 Opioid abuse, uncomplicated: Secondary | ICD-10-CM | POA: Diagnosis not present

## 2023-05-30 DIAGNOSIS — Z89512 Acquired absence of left leg below knee: Secondary | ICD-10-CM | POA: Diagnosis not present

## 2023-05-30 DIAGNOSIS — Z89421 Acquired absence of other right toe(s): Secondary | ICD-10-CM | POA: Diagnosis not present

## 2023-05-30 DIAGNOSIS — E662 Morbid (severe) obesity with alveolar hypoventilation: Secondary | ICD-10-CM | POA: Diagnosis not present

## 2023-05-30 DIAGNOSIS — E1122 Type 2 diabetes mellitus with diabetic chronic kidney disease: Secondary | ICD-10-CM | POA: Diagnosis not present

## 2023-06-06 NOTE — Telephone Encounter (Signed)
H&V Care Navigation CSW Progress Note  Clinical Social Worker  received a call from [place] with [facility]  to clarify pt coverage. They note that pt has [employer] and Medicaid and were wondering if we have the same, confirmed we do. They will try to see if they can navigate the two payors. Encouraged hospice team to reach out to me or [place], LCSW as needed.   Patient is participating in a Managed Medicaid Plan:  No  SDOH Screenings   Food Insecurity: Food Insecurity Present (05/15/2023)  Housing: Low Risk  (05/15/2023)  Recent Concern: Housing - Medium Risk (04/27/2023)  Transportation Needs: No Transportation Needs (05/15/2023)  Utilities: Not At Risk (05/15/2023)  Alcohol Screen: Low Risk  (04/14/2023)  Depression (PHQ2-9): Medium Risk (03/07/2023)  Financial Resource Strain: High Risk (05/25/2023)  Stress: Stress Concern Present (02/19/2021)  Tobacco Use: Medium Risk (05/26/2023)      Octavio Graves, MSW, LCSW Clinical Social Worker II [facility] Heart/Vascular Care Navigation  [phone]- work cell phone (preferred) [phone]- desk phone

## 2023-06-06 NOTE — Telephone Encounter (Signed)
Called to speak with patient about medications. Patient discharged on toresemide and potassium. Provider does not believe that the toresemide will work/cause patient to diurese, as diureses was failed in the hospital, but we continued it, incase it could provide some support. \  Patient to take Potassium only if she is taking toresimide. Patient otherwise does not need to take potassium pills. Potassium does not need to be taken every day. Only take if taking toresimide.   Please have patient call back with any questions.

## 2023-06-06 NOTE — Discharge Summary (Addendum)
Family Medicine Teaching [facility] Discharge Summary  Patient name: [name] Medical record number: [id number] Date of birth: 1976-09-20 Age: 47 y.o. Gender: female Date of Admission: 05/14/2023  Date of Discharge: 05/27/22 Admitting Physician: Nestor Ramp, MD  Primary Care Provider: Latrelle Dodrill, MD Consultants: Cardiology, Nephrology, Palliative Care, Hospice  Indication for Hospitalization: Chest Pain   Discharge Diagnoses/Problem List:  Principal Problem for Admission:  Chest pain / RV Heart Failure Other Problems addressed during stay:  Principal Problem:   Acute on chronic kidney failure [facility]) Active Problems:   Acute on chronic diastolic heart failure ([name])   Type 2 diabetes mellitus with hyperglycemia ([name])   RVF (right ventricular failure) ([name])   Chronic neuropathic pain    Brief Hospital Course:  [name] is a 47 y.o.female with a history of T2DM, HTN, COPD, OSA, OHS, HTN, HFpEF, HLD, CAD  who was admitted to the [facility] Medicine Teaching Service at [facility] for chest pain after recent heart cath. Her hospital course is detailed below:  Chest Pain  Heart failure Patient had right heart catheterization on 6/6 patient to assess heart failure and pulmonary artery hypertension. Was found to have elevated R>L heart filling pressures, mixed PV/PAH, low cardiac output + low PAPi suspected 2/2 to RV failure.  Patient admitted for observation.  UDS positive for cocaine.  EKG without ischemic changes.  Troponin flat 43>44.  BNP of 19.3. In ED patient initially placed on 15 L/min on NRB but quickly weaned down to home 4 L nasal cannula with O2 saturations in high 90s. CXR showing mild pulmonary vascular congestion, BNP WNL.  VBG showing baseline hypercarbia, normal pH. Was given IV diuresis and pain control.  Chest pain thought to be related to vasospasm from cocaine use.   Cardiology also recommended right heart cath.  This was performed on 6/17 and showed RV  failure, elevated PA mean and PVR. Patient was briefly on milrinone for RV support but this did not result in much improvement, so it was weaned off.   [place] Hospitalized twice within the last month due to uremia and AKI on CKD 4.  BUN remained elevated at 143 however patient was asymptomatic.  Patient underwent aggressive diuresis per nephrology.  She received acetazolamide during her stay for metabolic alkalosis. She was then started on HD. HD was started but no fluid able to be removed due to RV failure.    After discussion with palliative care, heart failure, nephrology and her PCP, the patient ultimately decided to transition home with hospice   T2DM Patient's blood sugars were elevated during her stay.  Her insulin was titrated until her blood glucoses were stable, she was sent home on her home regimen.  Other chronic conditions were medically managed with home medications and formulary alternatives as necessary (T2DM, OSA)    Disposition: Home  Discharge Condition: stable  Discharge Exam:  Vitals:   05/27/23 2100 05/28/23 0548  BP: 114/67 125/71  Pulse: (!) 102   Resp: 18 14  Temp: 98.2 F (36.8 C) 97.9 F (36.6 C)  SpO2: 99% 100%    Significant Procedures: Right Heart Cath  Significant Labs and Imaging:  No results for input(s): "WBC", "HGB", "HCT", "PLT" in the last 48 hours. No results for input(s): "NA", "K", "CL", "CO2", "GLUCOSE", "BUN", "CREATININE", "CALCIUM", "MG", "PHOS", "ALKPHOS", "AST", "ALT", "ALBUMIN", "PROTEIN" in the last 48 hours.  Results/Tests Pending at Time of Discharge: None  Discharge Medications:  Allergies as of 05/28/2023  Reactions   Cefepime Other (See Comments)   AKI, see records from [facility] hospitalization in January 2020.   Other reaction(s): Other (See Comments) "Shut down organs/kidneys" Note pt has tolerated Rocephin and Keflex Other reaction(s): Not available   Iodine Other (See Comments)   Kidney dysfunction    Pentoxifylline Nausea And Vomiting   Trental   Cephalosporins Other (See Comments)   Pt states that they cause her kidneys to shut down        Medication List     STOP taking these medications    metolazone 5 MG tablet Commonly known as: ZAROXOLYN   One-A-Day Womens tablet   rosuvastatin 10 MG tablet Commonly known as: CRESTOR       TAKE these medications    acetaminophen 325 MG tablet Commonly known as: TYLENOL Take 1-2 tablets (325-650 mg total) by mouth every 4 (four) hours as needed for mild pain.   albuterol 108 (90 Base) MCG/ACT inhaler Commonly known as: Ventolin HFA Inhale 2 puffs into the lungs every 6 (six) hours as needed for wheezing or shortness of breath.   amLODipine 10 MG tablet Commonly known as: NORVASC Take 1 tablet (10 mg total) by mouth daily.   aspirin EC 81 MG tablet Take 1 tablet (81 mg total) by mouth daily. Swallow whole.   atorvastatin 80 MG tablet Commonly known as: LIPITOR Take 1 tablet (80 mg total) by mouth daily.   budesonide-formoterol 160-4.5 MCG/ACT inhaler Commonly known as: Symbicort Inhale 2 puffs into the lungs 2 (two) times daily.   cetirizine 10 MG tablet Commonly known as: ZYRTEC Take 1 tablet (10 mg total) by mouth daily as needed for allergies.   Dexcom G6 Sensor Misc Inject 1 applicator into the skin as directed. Change sensor every 10 days.   Dexcom G6 Transmitter Misc Inject 1 Device into the skin as directed. Reuse 8 times with sensor changes.   diclofenac Sodium 1 % Gel Commonly known as: Voltaren Apply 2 g topically 4 (four) times daily. What changed:  when to take this reasons to take this   DULoxetine 60 MG capsule Commonly known as: CYMBALTA TAKE 2 CAPSULES BY MOUTH DAILY   gabapentin 300 MG capsule Commonly known as: NEURONTIN Take 1 capsule (300 mg total) by mouth at bedtime. What changed: how much to take   GNP UltiCare Pen Needles 32G X 4 MM Misc Generic drug: Insulin Pen Needle USE  TO inject insulin 2 TIMES DAILY   insulin aspart 100 UNIT/ML injection Commonly known as: novoLOG Inject 45 Units into the skin 3 (three) times daily before meals.   ipratropium-albuterol 0.5-2.5 (3) MG/3ML Soln Commonly known as: DUONEB Take 3 mLs by nebulization every 6 (six) hours as needed. What changed: reasons to take this   latanoprost 0.005 % ophthalmic solution Commonly known as: XALATAN Place 1 drop into both eyes daily.   nitroGLYCERIN 0.4 MG SL tablet Commonly known as: NITROSTAT Place 1 tablet (0.4 mg total) under the tongue every 5 (five) minutes as needed for chest pain.   nystatin powder Commonly known as: MYCOSTATIN/NYSTOP Apply 1 Application topically 3 (three) times daily.   omeprazole 40 MG capsule Commonly known as: PRILOSEC Take 1 capsule (40 mg total) by mouth in the morning and at bedtime.   ondansetron 4 MG disintegrating tablet Commonly known as: ZOFRAN-ODT Take 1 tablet (4 mg total) by mouth every 8 (eight) hours as needed for nausea or vomiting.   potassium chloride SA 20 MEQ tablet Commonly known as:  KLOR-CON M Take 1 tablet (20 mEq total) by mouth daily.   prednisoLONE acetate 1 % ophthalmic suspension Commonly known as: PRED FORTE Place 1 drop into both eyes 4 (four) times daily.   promethazine 12.5 MG tablet Commonly known as: PHENERGAN Take 1 tablet (12.5 mg total) by mouth every 6 (six) hours as needed for nausea or vomiting.   Rhopressa 0.02 % Soln Generic drug: Netarsudil Dimesylate Place 1 drop into the left eye daily.   rOPINIRole 1 MG tablet Commonly known as: REQUIP Take 1 tablet (1 mg total) by mouth at bedtime.   sevelamer carbonate 800 MG tablet Commonly known as: RENVELA Take 1 tablet (800 mg total) by mouth 3 (three) times daily with meals.   Simbrinza 1-0.2 % Susp Generic drug: Brinzolamide-Brimonidine Place 1 drop into both eyes in the morning, at noon, and at bedtime.   torsemide 100 MG tablet Commonly known  as: DEMADEX Take 1 tablet (100 mg total) by mouth every morning AND 0.5 tablets (50 mg total) every evening.   traZODone 100 MG tablet Commonly known as: DESYREL TAKE 1 TABLET BY MOUTH AT BEDTIME AS NEEDED FOR SLEEP What changed: reasons to take this   [place] FlexTouch 200 UNIT/ML FlexTouch Pen Generic drug: insulin degludec Inject 70 Units into the skin 2 (two) times daily.   Vitamin D (Cholecalciferol) 25 MCG (1000 UT) Caps Take 1,000 Units by mouth daily.        Discharge Instructions: Please refer to Patient Instructions section of EMR for full details.  Patient was counseled important signs and symptoms that should prompt return to medical care, changes in medications, dietary instructions, activity restrictions, and follow up appointments.   Follow-Up Appointments:   Bess Kinds, MD , 1:39 PM PGY-2, [facility]

## 2023-06-06 DEATH — deceased

## 2023-06-23 IMAGING — CR DG FOOT COMPLETE 3+V*R*
3 series · 3 of 3 positions shown · non-contrast
Comparison: RIGHT foot XRs, 12/18/2021.

CLINICAL DATA: Diabetic foot ulcer

EXAM:
RIGHT FOOT COMPLETE - 3+ VIEW

[foot ap]
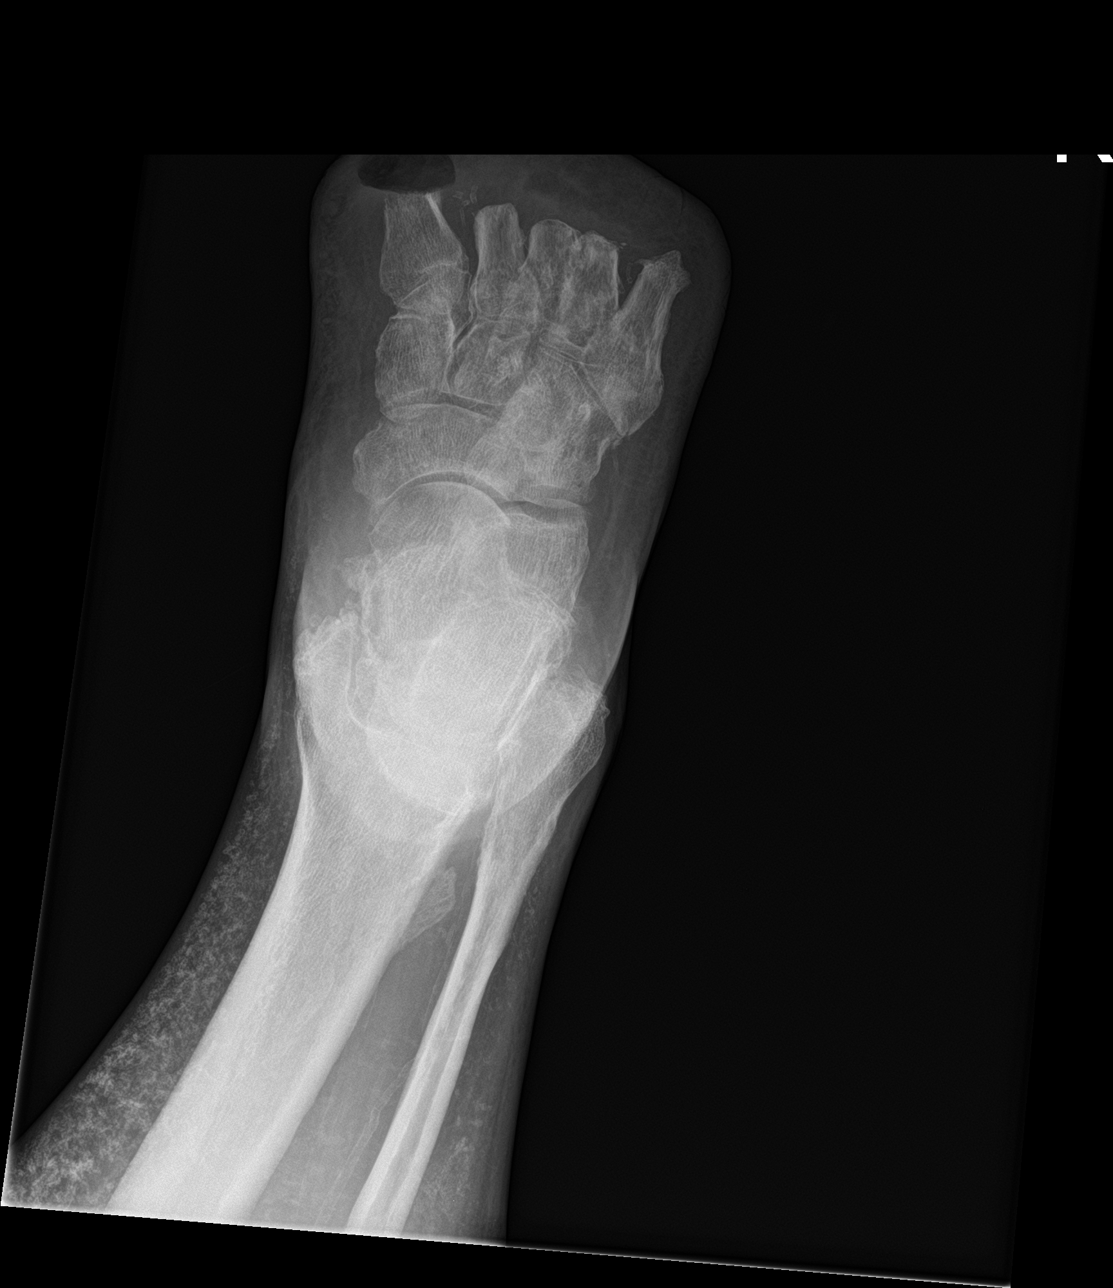

[foot obl]
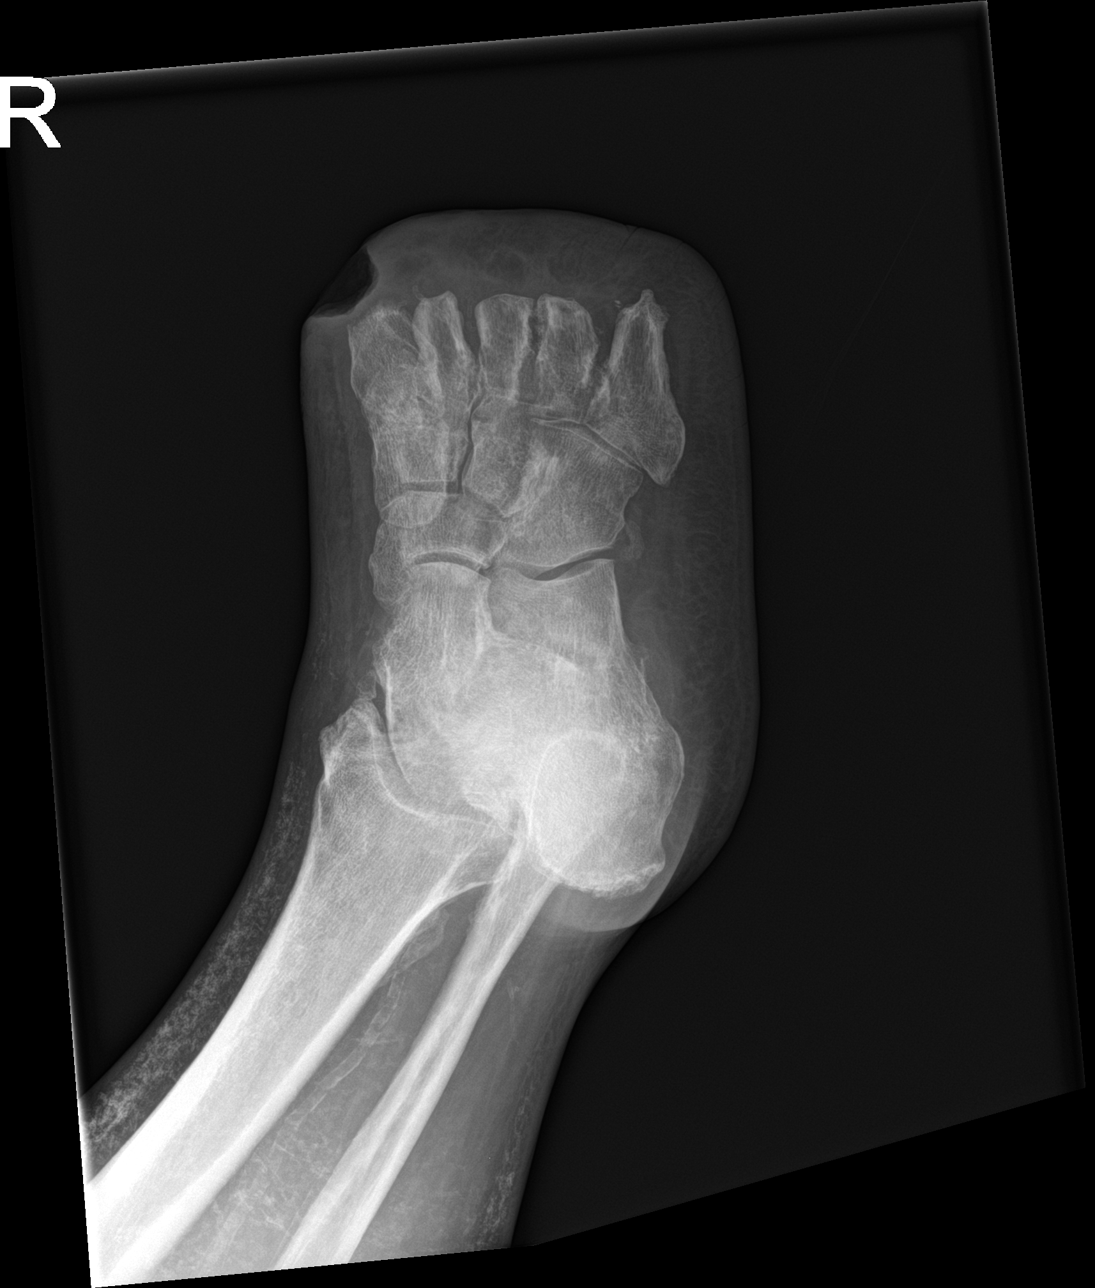

[foot lat]
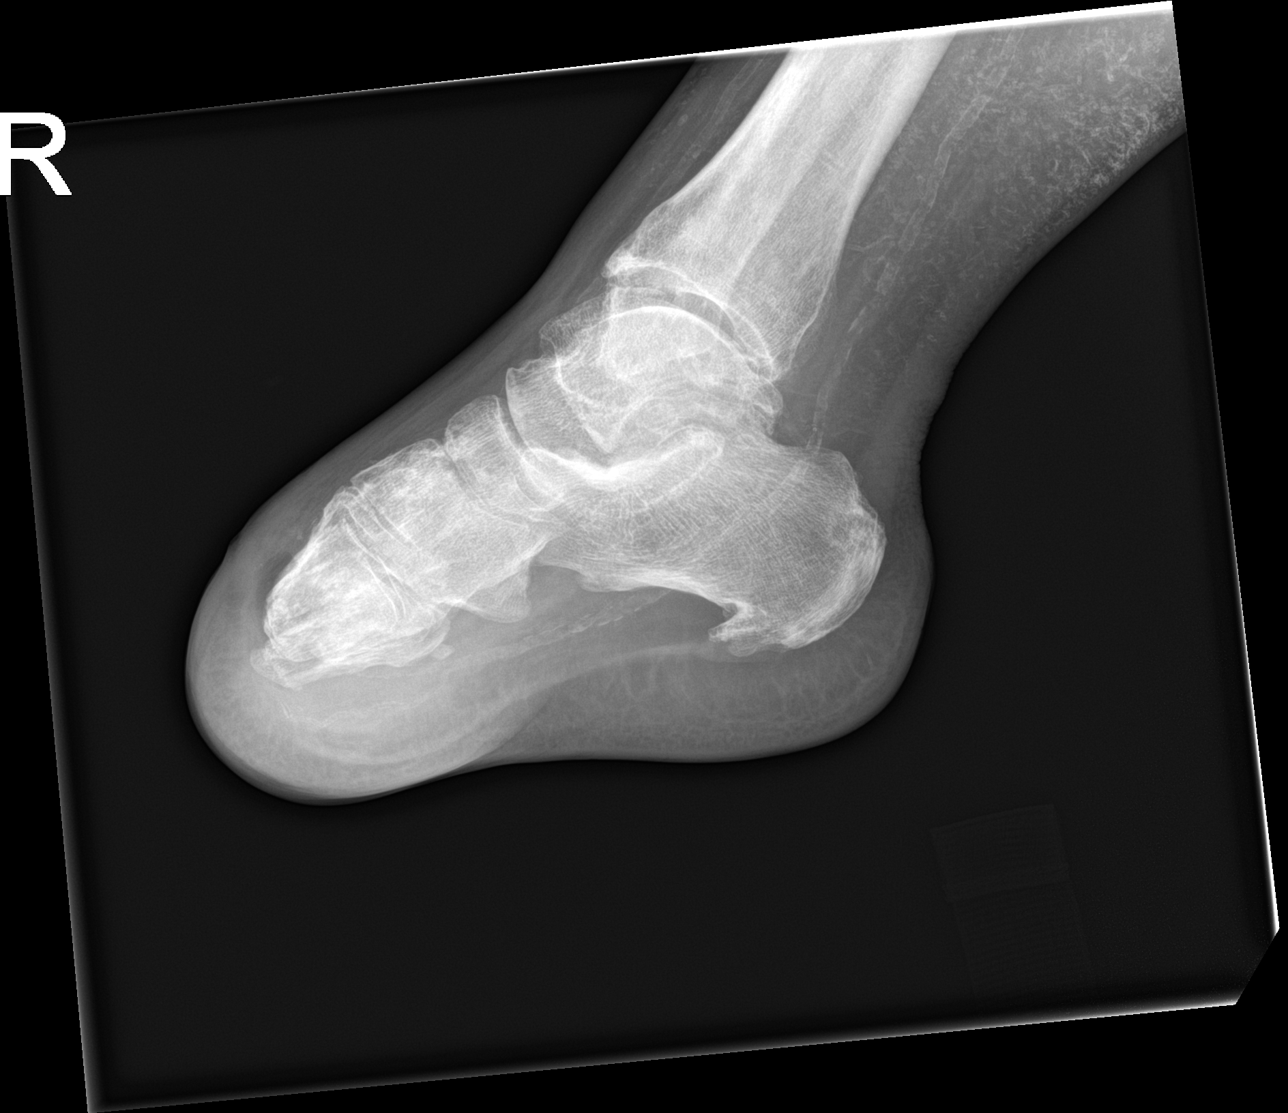

[3 of 3 positions shown; findings below may reference images not displayed]

FINDINGS: Transmetatarsal amputations at the RIGHT foot. Enlarged soft tissue
defect at the superomedial margin of the stump. Question
demineralization and loss of cortication at the first metatarsal
stump.

Vascular calcifications and calciphylaxis within the soft tissues.
IMPRESSION: Enlarged soft tissue ulcer with question of demineralization and
loss of cortication at the first metatarsal stump.

Findings suspicious for osteomyelitis. Consider MR for further
evaluation.

## 2023-06-24 IMAGING — DX DG CHEST 1V PORT
1 series · 1 of 1 positions shown · non-contrast
Comparison: 04/11/2022

CLINICAL DATA: Orthopnea

EXAM:
PORTABLE CHEST 1 VIEW

[chest ap]
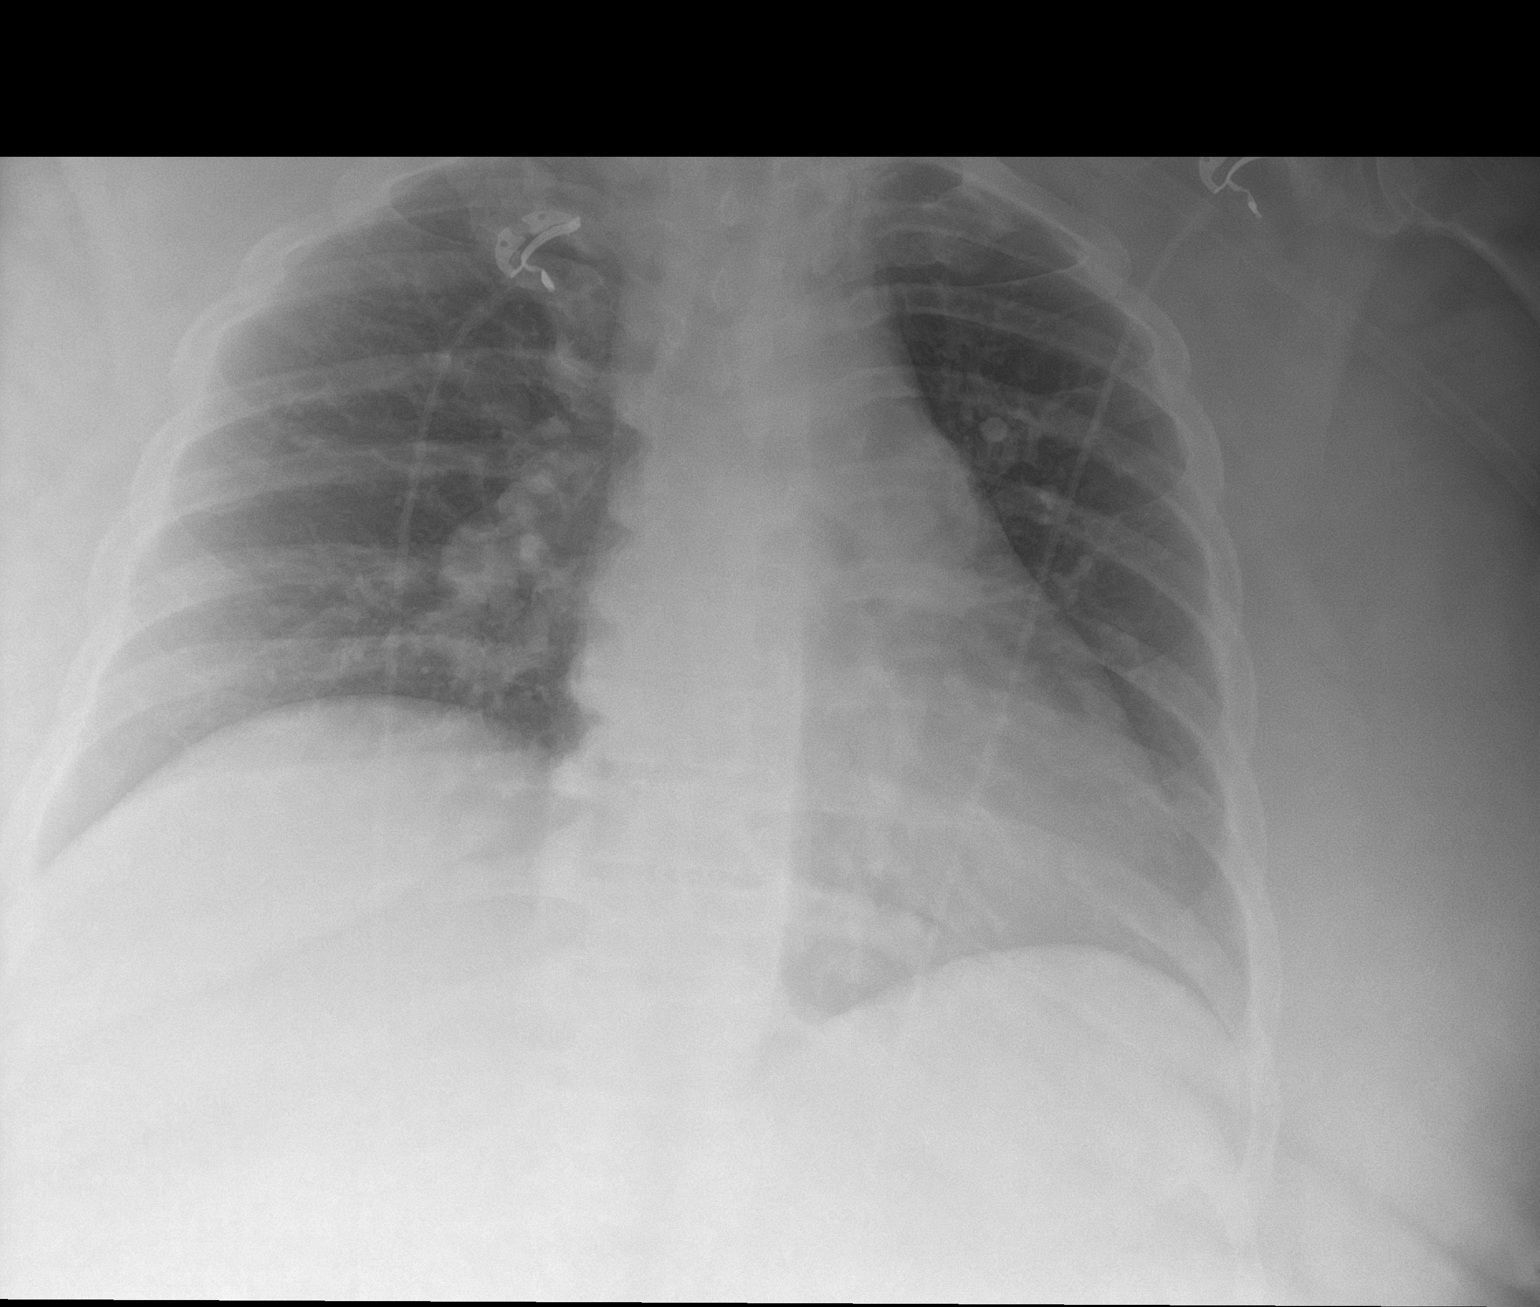

[1 of 1 positions shown; findings below may reference images not displayed]

FINDINGS: Limited exam because of body habitus. Borderline cardiomegaly with
central vascular congestion. Low lung volumes with elevation of the
right hemidiaphragm. Negative for edema, CHF, effusion, definite
pneumonia, pneumothorax. Trachea midline. Degenerative changes of
the spine.
IMPRESSION: Cardiomegaly with low lung volumes.

## 2023-06-26 IMAGING — MR MR FOOT*R* WO/W CM
4 of 9 series · 19 of 40 positions shown · IV contrast (gadavist)
Comparison: Radiographs 12/05/2018, 12/18/2021 and 05/14/2022. MRI
12/19/2021.

CLINICAL DATA: Osteomyelitis, foot suspected. Diabetic foot ulcer.
History of right foot amputation, date of surgery unknown.

EXAM:
MRI OF THE RIGHT FOREFOOT WITHOUT AND WITH CONTRAST
TECHNIQUE: Multiplanar, multisequence MR imaging of the right foot was
performed before and after the administration of intravenous
contrast.
CONTRAST:  5mL GADAVIST GADOBUTROL 1 MMOL/ML IV SOLN

[Series 3: T1 · oblique · 4.0mm · 0.27mm/px · 6 of 35 slices shown (1 of 2)]
[im 1/35]
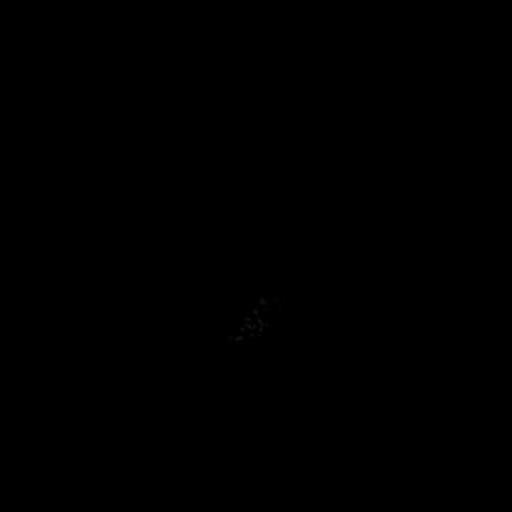
[im 7/35]
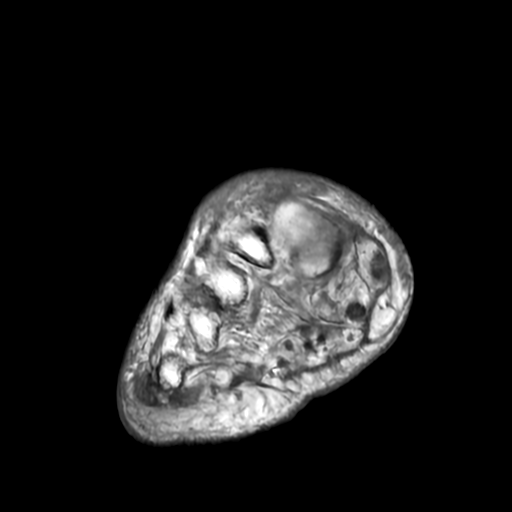
[im 14/35]
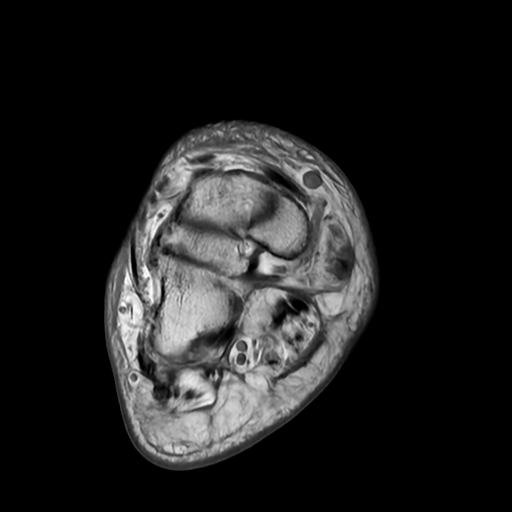
[im 21/35]
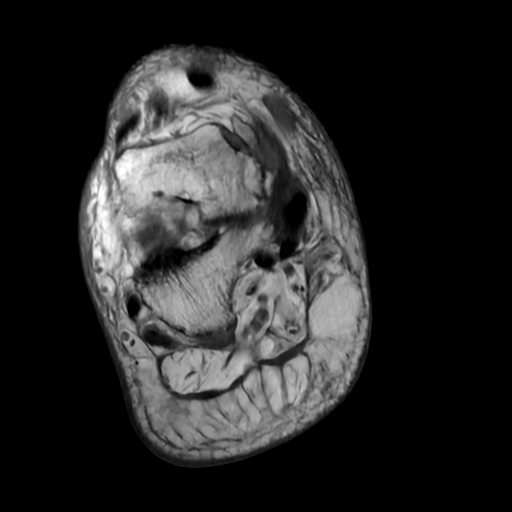
[im 28/35]
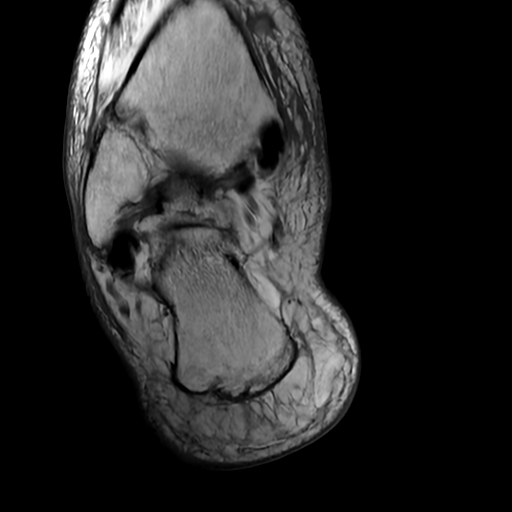
[im 35/35]
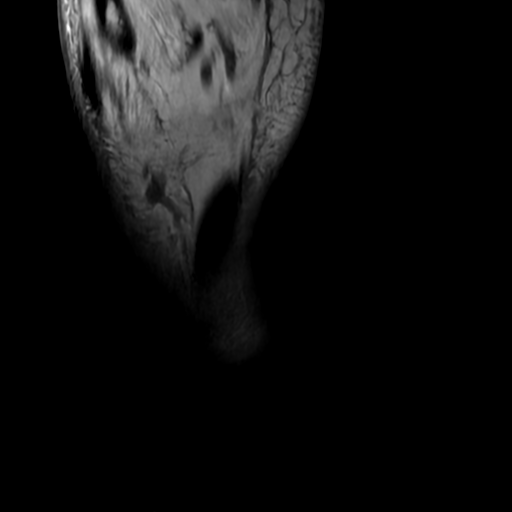

[Series 4: T2 fat-sat · oblique · 4.0mm · 0.27mm/px · 6 of 35 slices shown]
[im 1/35]
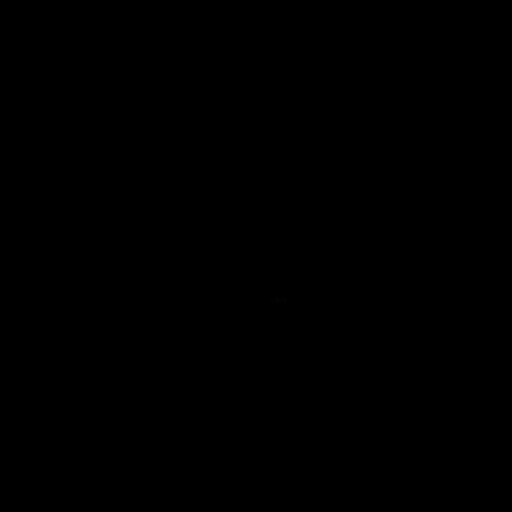
[im 7/35]
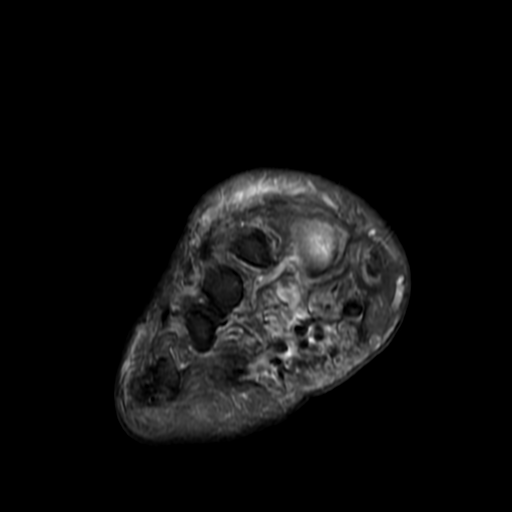
[im 14/35]
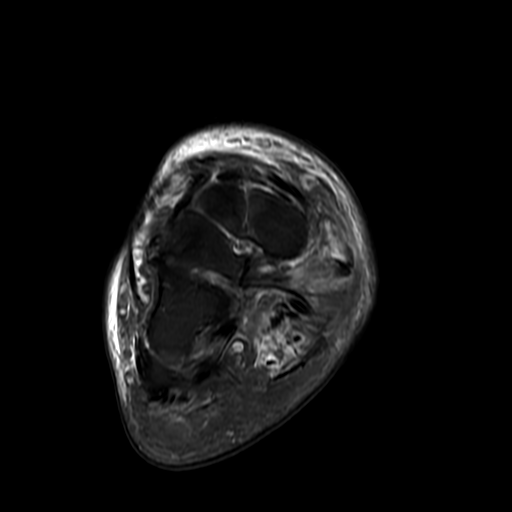
[im 21/35]
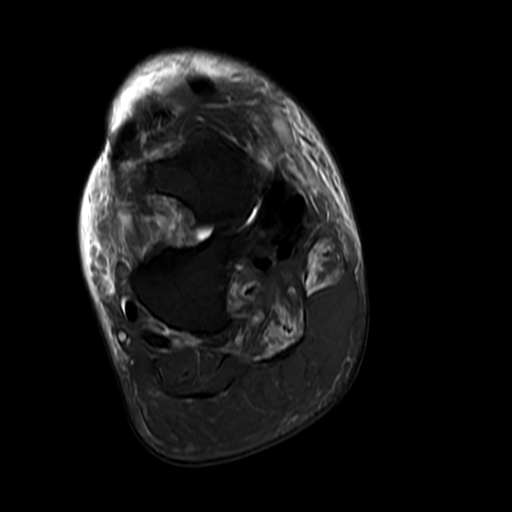
[im 28/35]
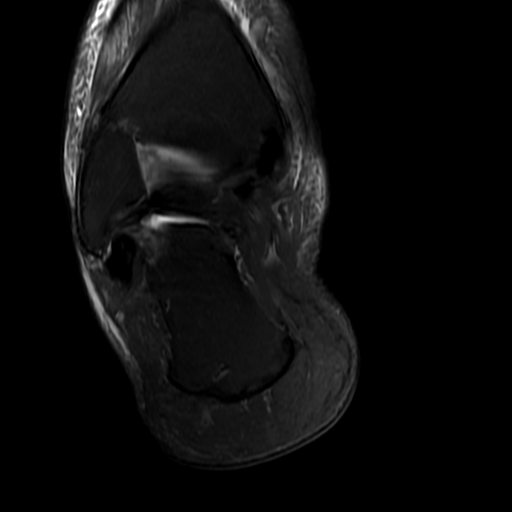
[im 35/35]
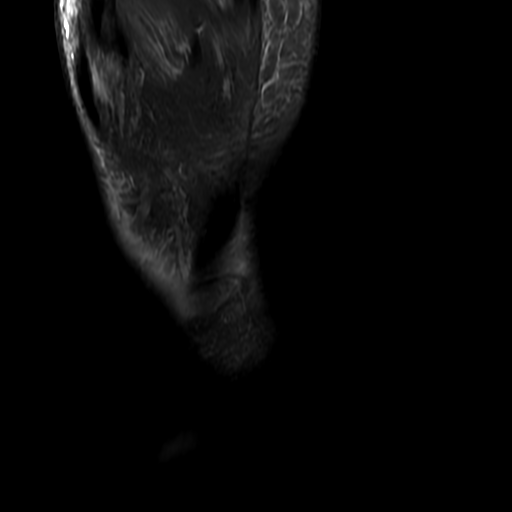

[Series 6: T1 fat-sat · oblique · non-contrast · 4.0mm · 0.27mm/px · 4 of 35 slices shown]
[im 1/35]
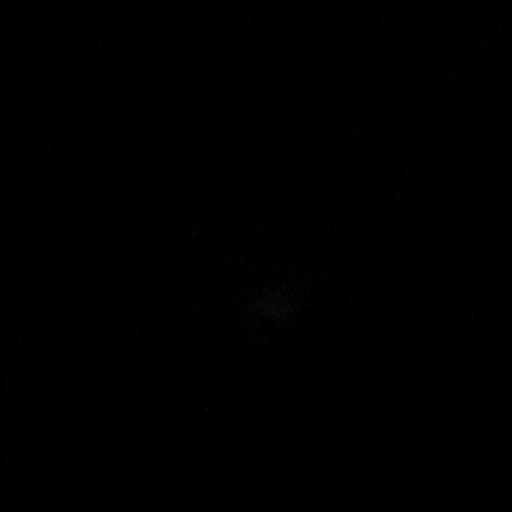
[im 9/35]
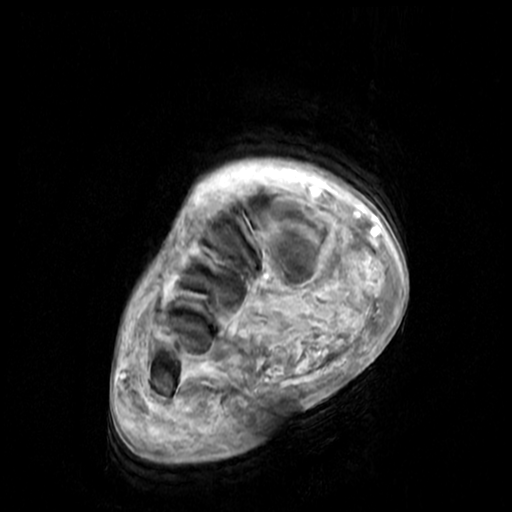
[im 18/35]
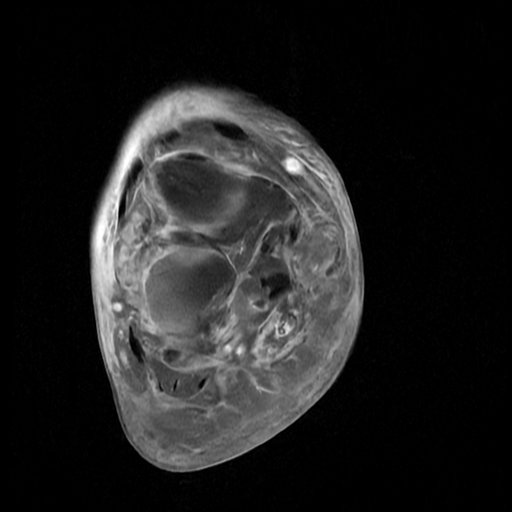
[im 35/35]
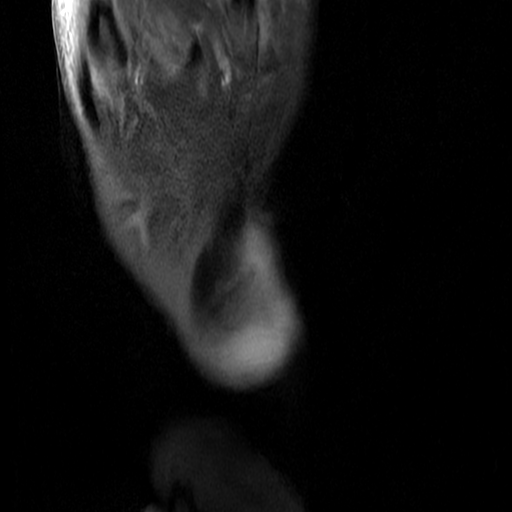

[Series 7: T1 · oblique · 4.0mm · 0.35mm/px · 3 of 20 slices shown (2 of 2)]
[im 1/20]
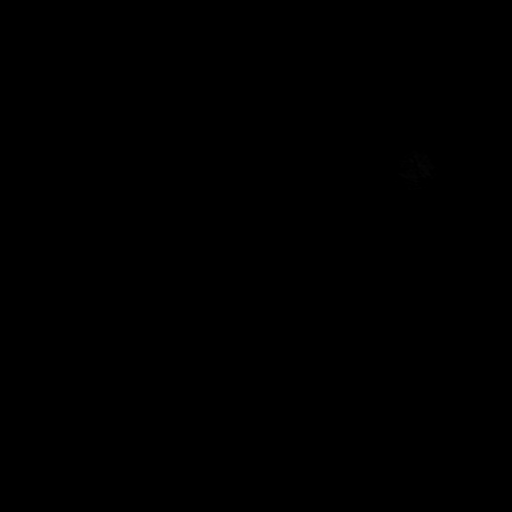
[im 10/20]
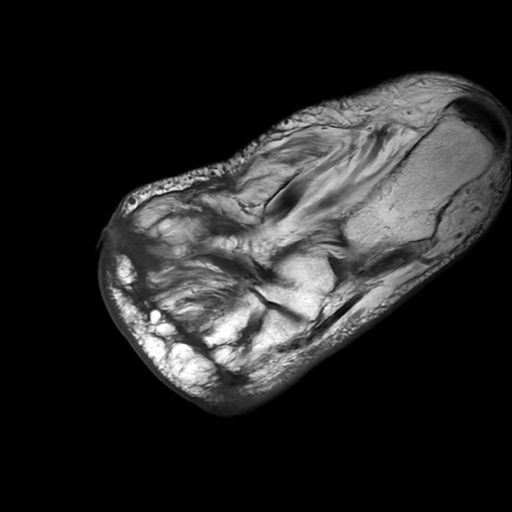
[im 20/20]
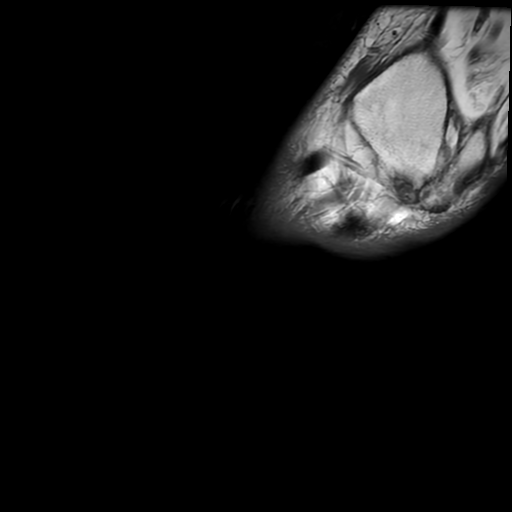

[19 of 40 positions shown; findings below may reference images not displayed]

FINDINGS: Bones/Joint/Cartilage

Status post transmetatarsal amputations through the bases of all the
metatarsals, occurring between the radiographs obtained in 0309 and
12/18/2021. These postsurgical changes appear stable from the
previous MRI. There is new bone marrow edema and enhancement within
1st metatarsal adjacent to the amputation, suspicious for
osteomyelitis. As seen radiographically, there is an adjacent area
of soft tissue ulceration and/or soft tissue emphysema. The
additional metatarsal bases appear unchanged. No suspicious osseous
findings within the midfoot or hindfoot.

Ligaments

Intact Lisfranc ligament.

Muscles and Tendons

Chronic fatty atrophy and denervation changes. No focal fluid
collection or suspicious enhancement.

Soft tissues

Increased soft tissue ulceration medially in the remaining forefoot
adjacent to the 1st metatarsal base. There is underlying soft tissue
enhancement without focal fluid collection. The soft tissue
ulceration abuts the signal changes described in the remaining 1st
metatarsal above.
IMPRESSION: 1. Increased soft tissue ulceration medially in the forefoot
abutting the 1st metatarsal remnant. New bone marrow edema and
enhancement within the remaining 1st metatarsal suspicious for
recurrent osteomyelitis.
2. The additional metatarsal bases and bones of the midfoot and
hindfoot appear unchanged.
3. No evidence of soft tissue abscess.

## 2023-06-30 ENCOUNTER — Ambulatory Visit: Payer: 59 | Admitting: Cardiovascular Disease

## 2023-06-30 IMAGING — DX DG FOOT COMPLETE 3+V*R*
2 series · 3 of 3 positions shown · non-contrast
Comparison: Right foot radiographs 05/14/2022; 12/18/2021; MRI
right forefoot

CLINICAL DATA: Status post surgery of the right foot.

EXAM:
RIGHT FOOT COMPLETE - 3+ VIEW

[Series 1: foot · 0.14mm/px · 2 of 2 slices shown]
[im 1/2]
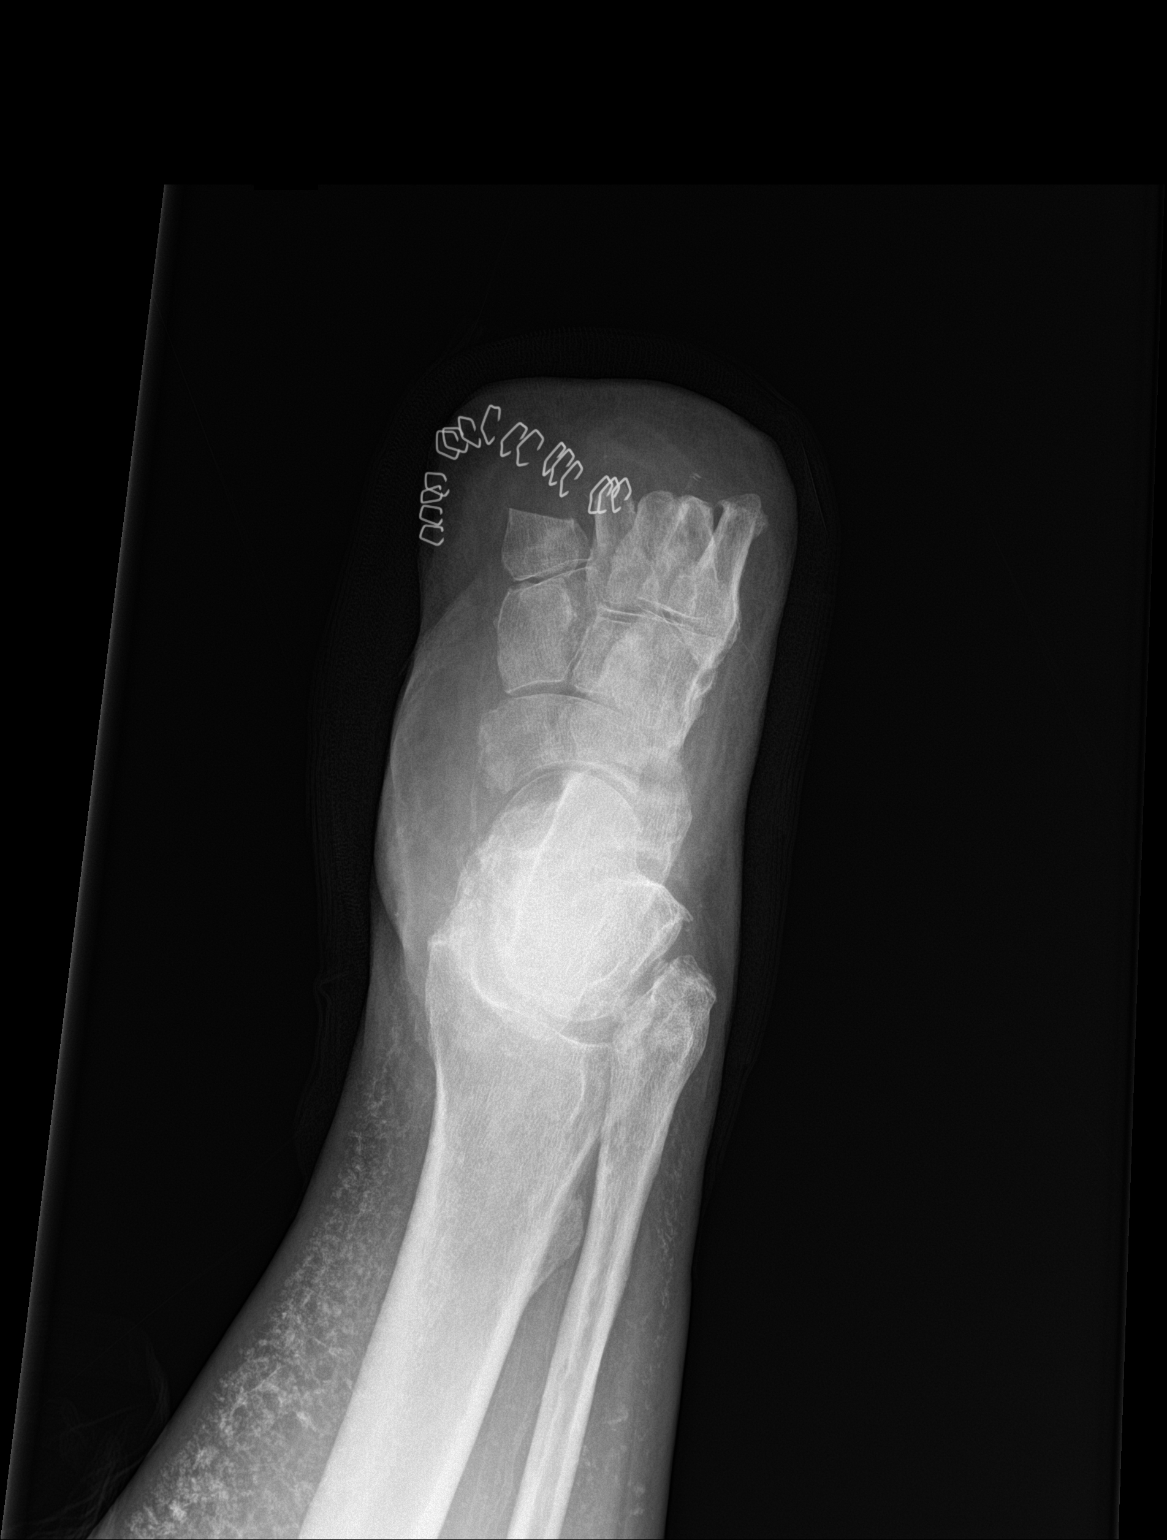
[im 2/2]
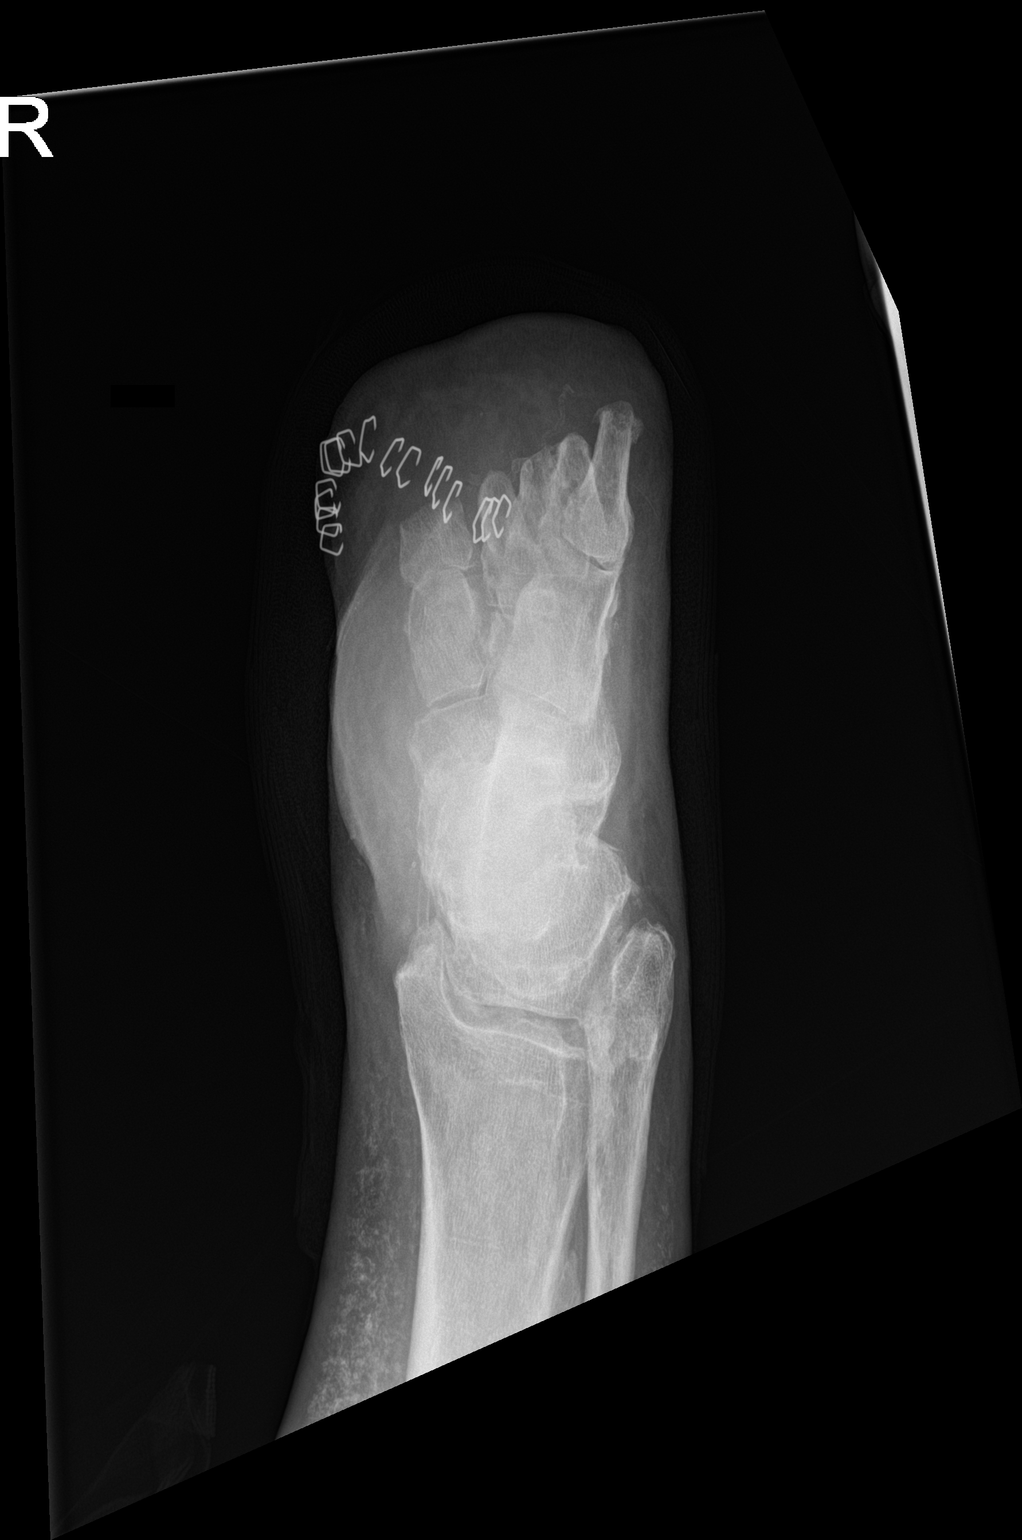

[leg]
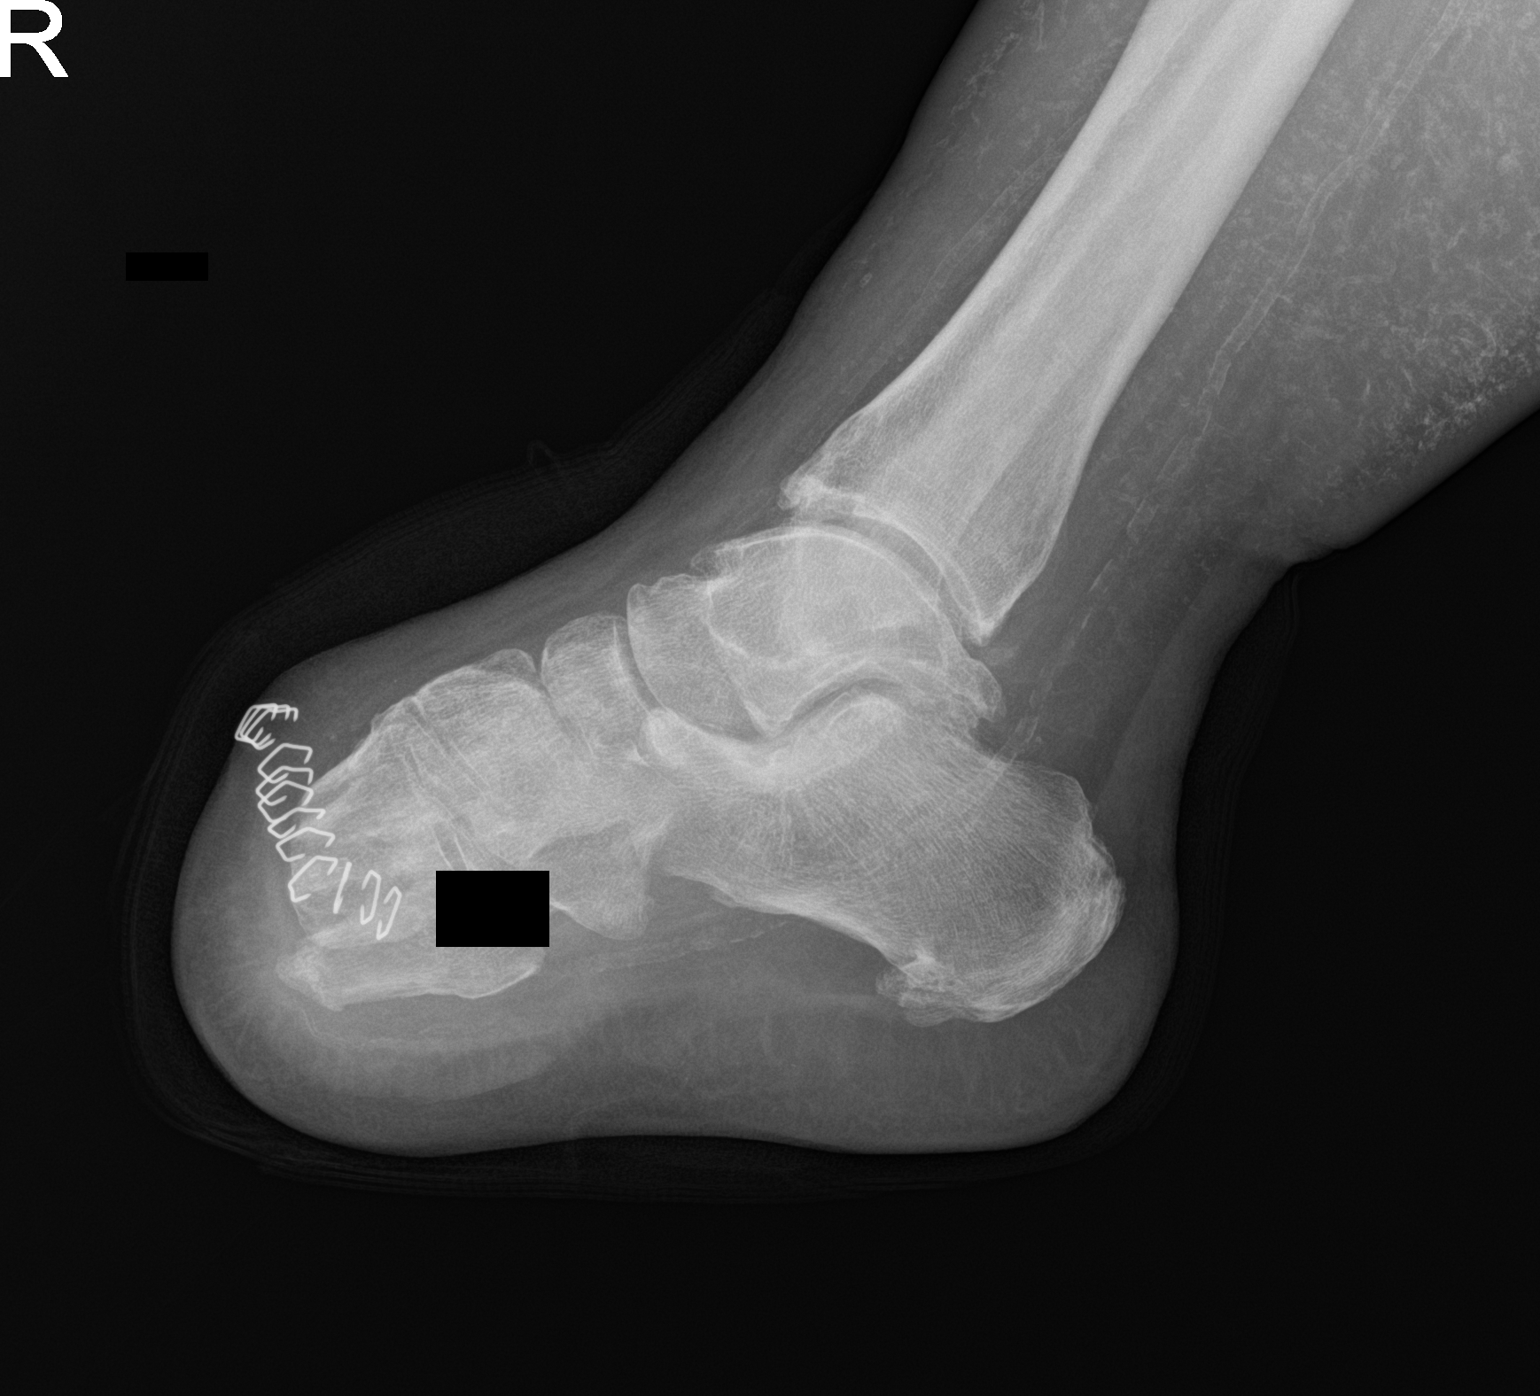

[3 of 3 positions shown; findings below may reference images not displayed]

FINDINGS: There are new surgical skin staples overlying the dorsal medial
forefoot. There is progressive amputation of the first ray now to
the proximal metatarsal shaft/proximal metaphysis. Unchanged
amputation of the second through fifth rays to the proximal shaft.
Sharp postsurgical margins.

Calcifications within the distal calf likely from chronic venous
stasis.
IMPRESSION: Interval repeat amputation of the first ray now to the level of the
proximal metaphysis. There is a sharp postsurgical margin.

## 2023-06-30 IMAGING — DX DG KNEE 1-2V*R*
2 series · 2 of 2 positions shown · non-contrast
Comparison: Right knee radiographs 03/30/2021

CLINICAL DATA: Right knee pain.

EXAM:
RIGHT KNEE - 1-2 VIEW

[x knee lat right (1 of 2)]
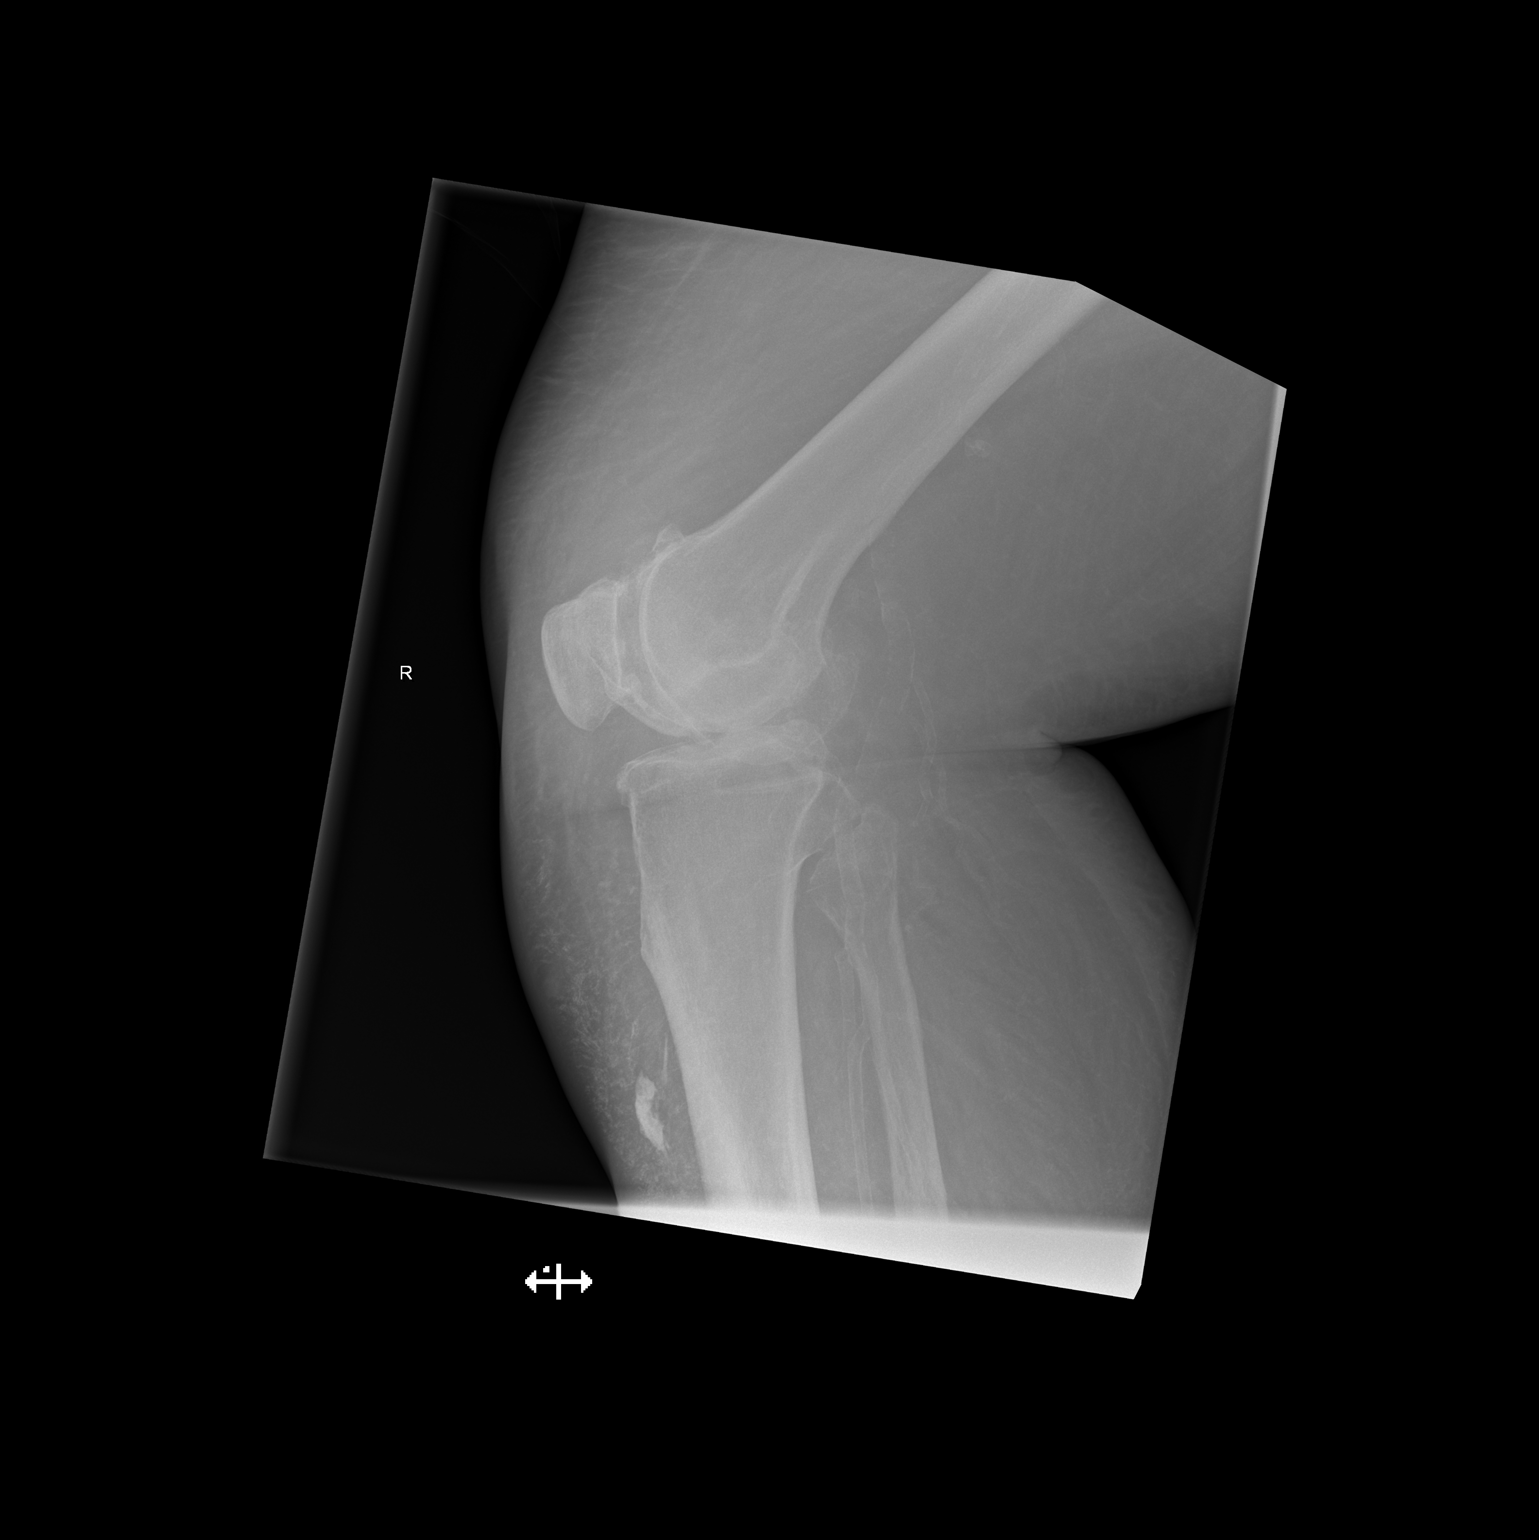

[x knee lat right (2 of 2)]
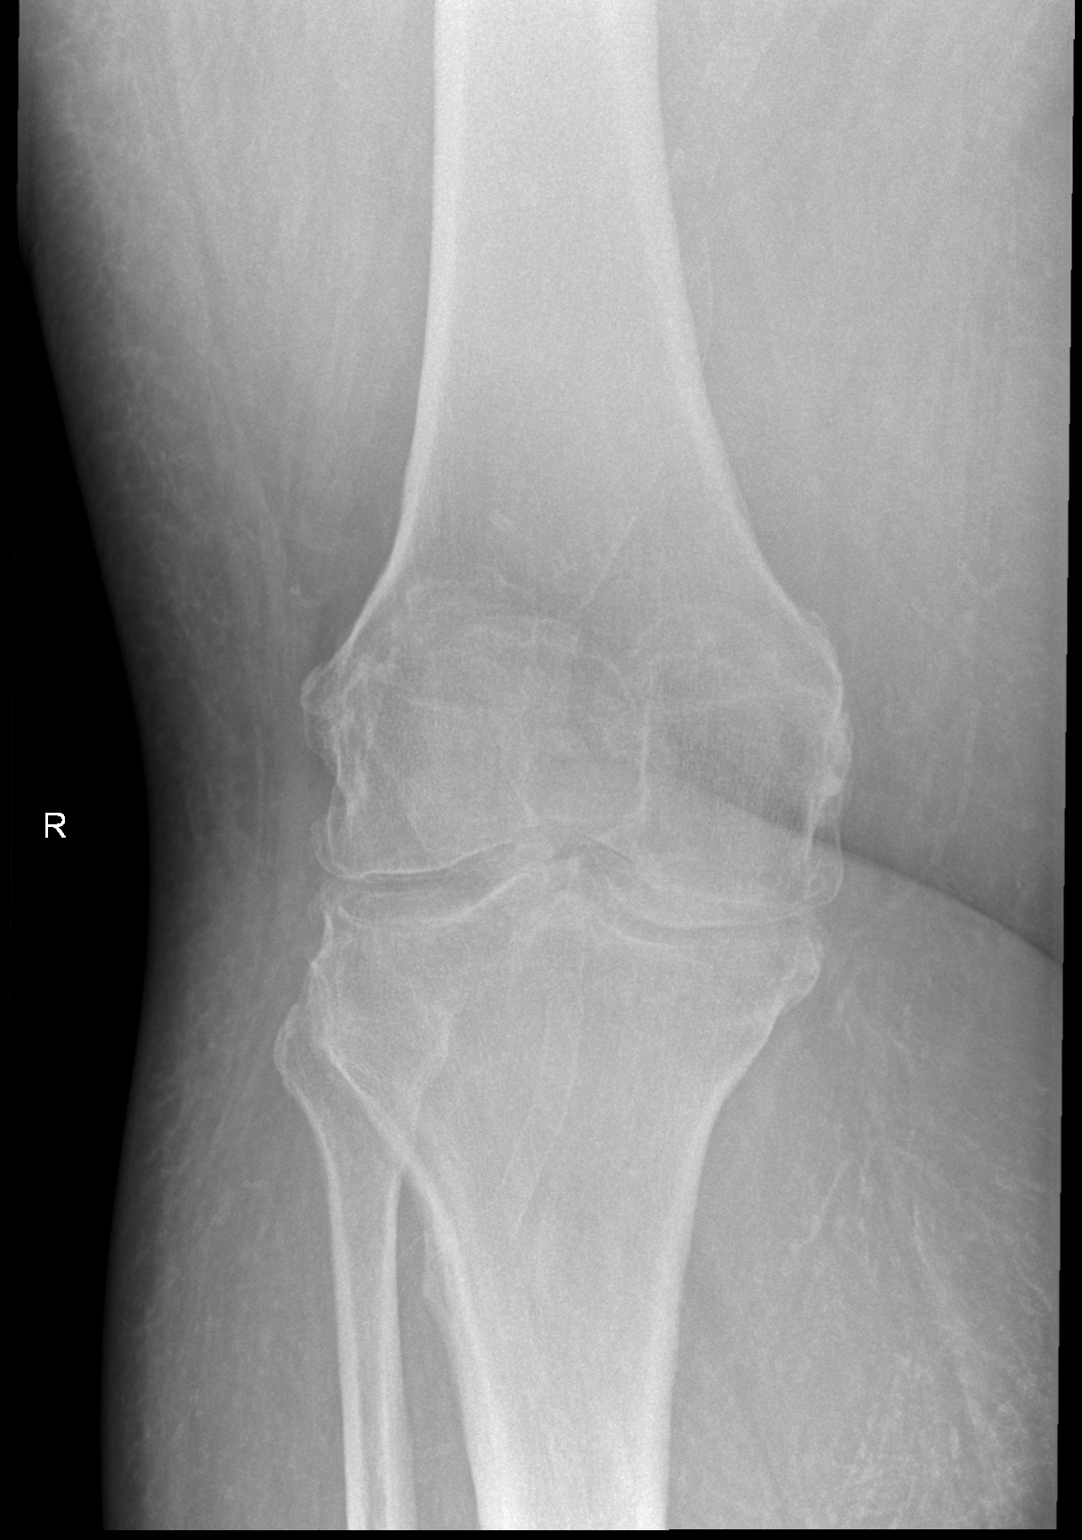

[2 of 2 positions shown; findings below may reference images not displayed]

FINDINGS: Severe patellofemoral joint space narrowing and peripheral
osteophytosis. No joint effusion. Severe medial and moderate lateral
compartment joint space narrowing and peripheral osteophytosis.
Curvilinear calcific density again overlies the anterior proximal
shin soft tissues on lateral view. Calcifications within the
anterior subcutaneous soft tissues again likely reflect venous
stasis calcification. Moderate atherosclerotic calcification. No
acute fracture is seen. No dislocation.
IMPRESSION: Severe patellofemoral and medial compartment and moderate lateral
compartment osteoarthritis.

## 2023-07-06 IMAGING — DX DG CHEST 1V PORT
1 series · 1 of 1 positions shown · non-contrast
Comparison: Previous studies including the examination of
05/15/2022

CLINICAL DATA: Shortness of breath

EXAM:
PORTABLE CHEST 1 VIEW

[chest ap]
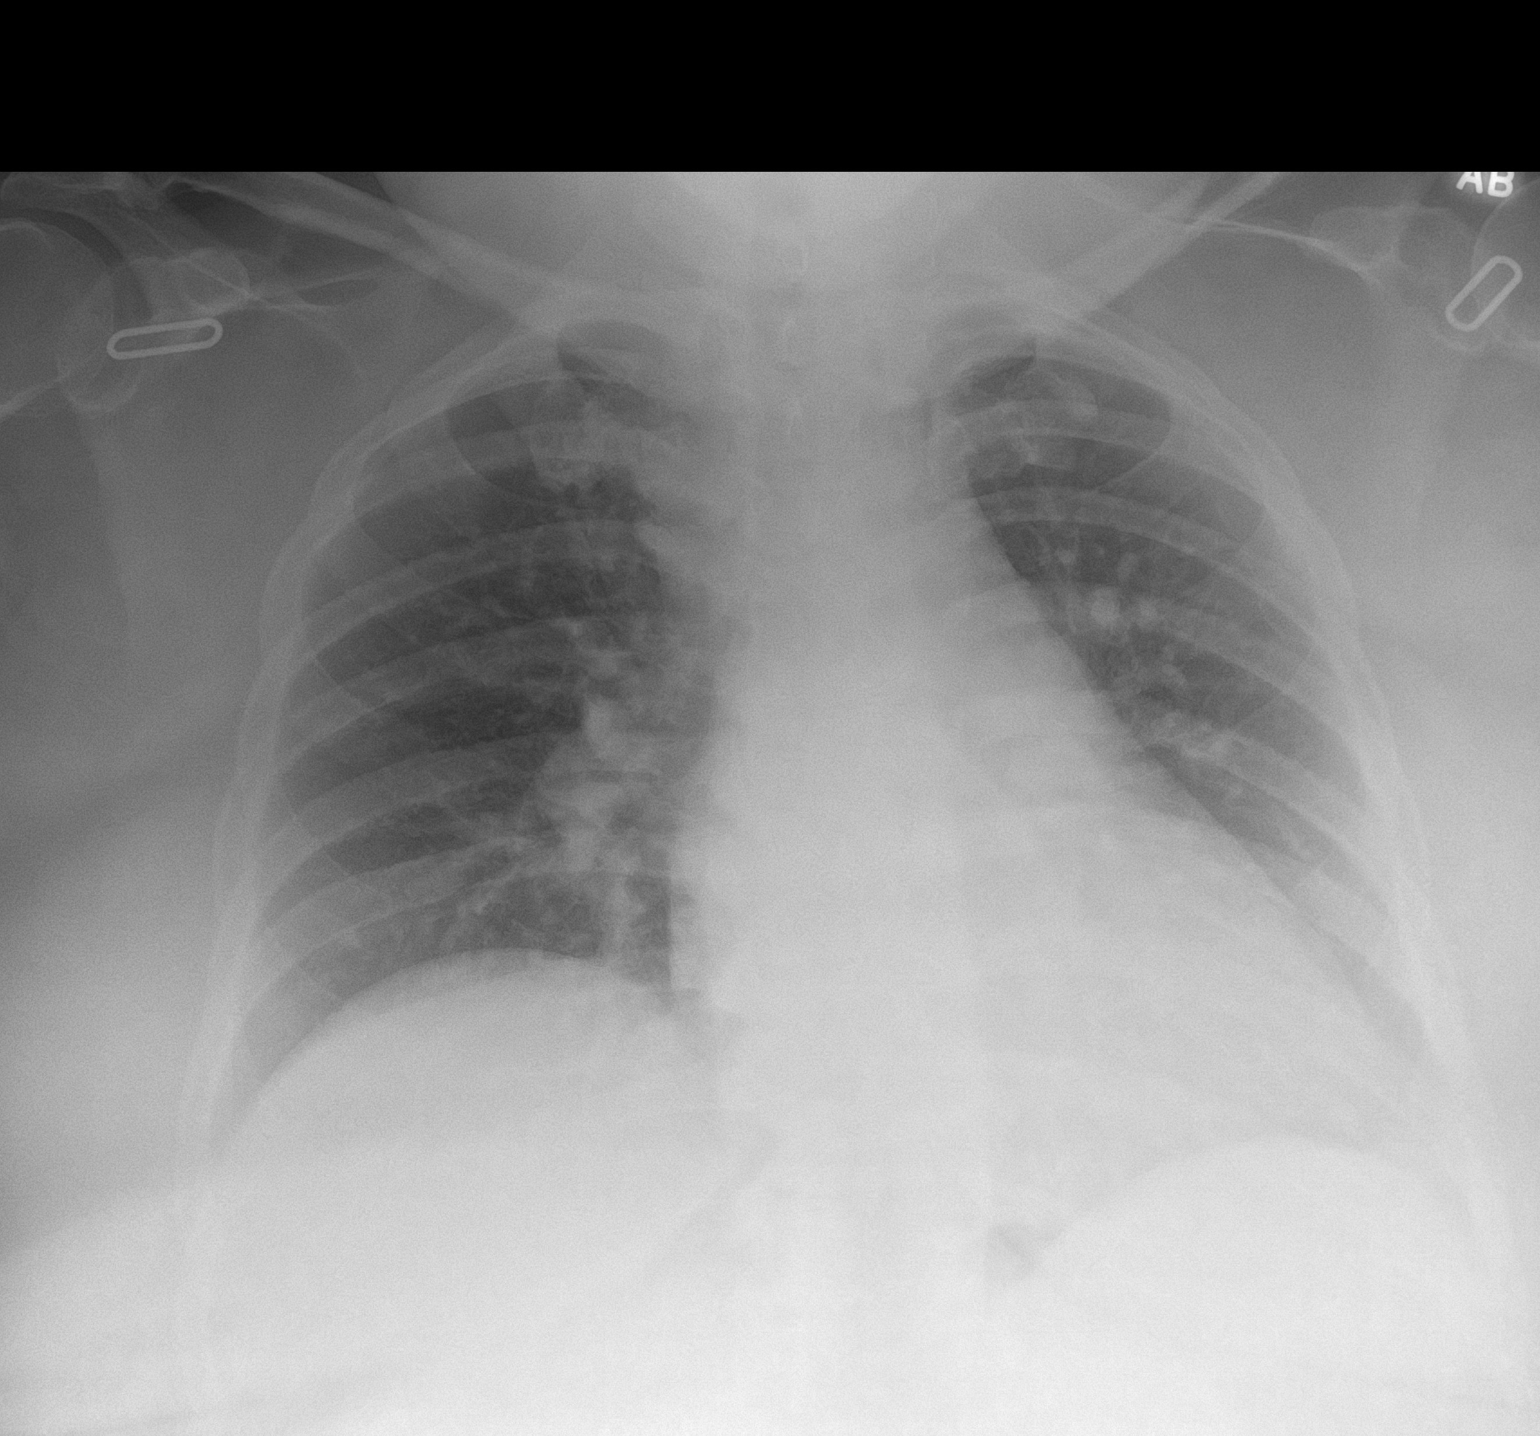

[1 of 1 positions shown; findings below may reference images not displayed]

FINDINGS: Transverse diameter of heart is slightly increased. There are no
signs of alveolar pulmonary edema or focal pulmonary consolidation.
There is no significant pleural effusion or pneumothorax.
IMPRESSION: There are no signs of pulmonary edema or new focal infiltrates.
